# Patient Record
Sex: Male | Born: 1985 | Race: Black or African American | Hispanic: No | Marital: Single | Smoking: Never smoker
Health system: Southern US, Community
[De-identification: ages and names within clinical notes are randomized; demographics above are authoritative.]

## PROBLEM LIST (undated history)

## (undated) DIAGNOSIS — M869 Osteomyelitis, unspecified: Secondary | ICD-10-CM

## (undated) HISTORY — PX: NO PAST SURGERIES: SHX2092

## (undated) HISTORY — DX: Osteomyelitis, unspecified: M86.9

---

## 2010-05-16 ENCOUNTER — Inpatient Hospital Stay (HOSPITAL_COMMUNITY)
Admission: EM | Admit: 2010-05-16 | Discharge: 2010-05-20 | DRG: 295 | Disposition: A | Discharge status: Home / Self Care | Payer: BC Managed Care – PPO | Attending: Internal Medicine | Admitting: Internal Medicine

## 2010-05-16 ENCOUNTER — Inpatient Hospital Stay (INDEPENDENT_AMBULATORY_CARE_PROVIDER_SITE_OTHER)
Admission: RE | Admit: 2010-05-16 | Discharge: 2010-05-16 | Disposition: A | Discharge status: Home / Self Care | Payer: BC Managed Care – PPO | Source: Ambulatory Visit | Attending: Emergency Medicine | Admitting: Emergency Medicine

## 2010-05-16 DIAGNOSIS — Z23 Encounter for immunization: Secondary | ICD-10-CM

## 2010-05-16 DIAGNOSIS — IMO0001 Reserved for inherently not codable concepts without codable children: Principal | ICD-10-CM | POA: Diagnosis present

## 2010-05-16 DIAGNOSIS — R7989 Other specified abnormal findings of blood chemistry: Secondary | ICD-10-CM

## 2010-05-16 DIAGNOSIS — I10 Essential (primary) hypertension: Secondary | ICD-10-CM | POA: Diagnosis present

## 2010-05-16 DIAGNOSIS — E119 Type 2 diabetes mellitus without complications: Secondary | ICD-10-CM

## 2010-05-16 DIAGNOSIS — E860 Dehydration: Secondary | ICD-10-CM | POA: Diagnosis present

## 2010-05-16 LAB — DIFFERENTIAL
Basophils Absolute: 0 10*3/uL (ref 0.0–0.1)
Basophils Relative: 1 % (ref 0–1)
Eosinophils Absolute: 0 10*3/uL (ref 0.0–0.7)
Eosinophils Relative: 0 % (ref 0–5)
Lymphocytes Relative: 24 % (ref 12–46)
Lymphs Abs: 2 10*3/uL (ref 0.7–4.0)
Monocytes Absolute: 0.4 10*3/uL (ref 0.1–1.0)
Monocytes Relative: 5 % (ref 3–12)
Neutro Abs: 5.8 10*3/uL (ref 1.7–7.7)
Neutrophils Relative %: 70 % (ref 43–77)

## 2010-05-16 LAB — KETONES, QUALITATIVE: Acetone, Bld: NEGATIVE

## 2010-05-16 LAB — COMPREHENSIVE METABOLIC PANEL
ALT: 80 U/L — ABNORMAL HIGH (ref 0–53)
AST: 49 U/L — ABNORMAL HIGH (ref 0–37)
Albumin: 4.4 g/dL (ref 3.5–5.2)
Alkaline Phosphatase: 120 U/L — ABNORMAL HIGH (ref 39–117)
BUN: 12 mg/dL (ref 6–23)
CO2: 30 mEq/L (ref 19–32)
Calcium: 9.7 mg/dL (ref 8.4–10.5)
Chloride: 86 mEq/L — ABNORMAL LOW (ref 96–112)
Creatinine, Ser: 1.4 mg/dL (ref 0.4–1.5)
GFR calc Af Amer: >60 mL/min (ref >60)
GFR calc non Af Amer: >60 mL/min (ref >60)
Glucose, Bld: 667 mg/dL (ref 70–99)
Potassium: 4.8 mEq/L (ref 3.5–5.1)
Sodium: 132 mEq/L — ABNORMAL LOW (ref 135–145)
Total Bilirubin: 0.8 mg/dL (ref 0.3–1.2)
Total Protein: 8.6 g/dL — ABNORMAL HIGH (ref 6.0–8.3)

## 2010-05-16 LAB — CBC
HCT: 45.5 % (ref 39.0–52.0)
Hemoglobin: 16 g/dL (ref 13.0–17.0)
MCH: 28.8 pg (ref 26.0–34.0)
MCHC: 35.2 g/dL (ref 30.0–36.0)
MCV: 82 fL (ref 78.0–100.0)
Platelets: 267 10*3/uL (ref 150–400)
RBC: 5.55 MIL/uL (ref 4.22–5.81)
RDW: 11.6 % (ref 11.5–15.5)
WBC: 8.4 10*3/uL (ref 4.0–10.5)

## 2010-05-16 LAB — GLUCOSE, CAPILLARY
Glucose-Capillary: >600 mg/dL (ref 70–99)
Glucose-Capillary: >600 mg/dL (ref 70–99)
Glucose-Capillary: 434 mg/dL — ABNORMAL HIGH (ref 70–99)
Glucose-Capillary: 444 mg/dL — ABNORMAL HIGH (ref 70–99)

## 2010-05-16 LAB — URINALYSIS, ROUTINE W REFLEX MICROSCOPIC
Bilirubin Urine: NEGATIVE
Ketones, ur: 15 mg/dL — AB
Leukocytes, UA: NEGATIVE
Nitrite: NEGATIVE
Protein, ur: NEGATIVE mg/dL
Specific Gravity, Urine: 1.041 — ABNORMAL HIGH (ref 1.005–1.030)
Urine Glucose, Fasting: >1000 mg/dL — AB
Urobilinogen, UA: 0.2 mg/dL (ref 0.0–1.0)
pH: 5.5 (ref 5.0–8.0)

## 2010-05-16 LAB — URINE MICROSCOPIC-ADD ON

## 2010-05-16 LAB — POCT URINALYSIS DIPSTICK
Bilirubin Urine: NEGATIVE
Ketones, ur: NEGATIVE mg/dL
Nitrite: NEGATIVE
Protein, ur: NEGATIVE mg/dL
Specific Gravity, Urine: <=1.005 (ref 1.005–1.030)
Urine Glucose, Fasting: 500 mg/dL — AB
Urobilinogen, UA: 0.2 mg/dL (ref 0.0–1.0)
pH: 5.5 (ref 5.0–8.0)

## 2010-05-16 LAB — POCT I-STAT, CHEM 8
BUN: 16 mg/dL (ref 6–23)
Calcium, Ion: 1.09 mmol/L — ABNORMAL LOW (ref 1.12–1.32)
Chloride: 88 mEq/L — ABNORMAL LOW (ref 96–112)
Creatinine, Ser: 1.3 mg/dL (ref 0.4–1.5)
Glucose, Bld: >700 mg/dL (ref 70–99)
HCT: 52 % (ref 39.0–52.0)
Hemoglobin: 17.7 g/dL — ABNORMAL HIGH (ref 13.0–17.0)
Potassium: 4.9 mEq/L (ref 3.5–5.1)
Sodium: 127 mEq/L — ABNORMAL LOW (ref 135–145)
TCO2: 30 mmol/L (ref 0–100)

## 2010-05-17 LAB — GLUCOSE, CAPILLARY
Glucose-Capillary: 379 mg/dL — ABNORMAL HIGH (ref 70–99)
Glucose-Capillary: 416 mg/dL — ABNORMAL HIGH (ref 70–99)
Glucose-Capillary: 343 mg/dL — ABNORMAL HIGH (ref 70–99)
Glucose-Capillary: 385 mg/dL — ABNORMAL HIGH (ref 70–99)

## 2010-05-17 LAB — HEMOGLOBIN A1C
Hgb A1c MFr Bld: 11.5 % — ABNORMAL HIGH (ref <5.7)
Mean Plasma Glucose: 283 mg/dL — ABNORMAL HIGH (ref <117)

## 2010-05-18 LAB — GLUCOSE, CAPILLARY
Glucose-Capillary: 341 mg/dL — ABNORMAL HIGH (ref 70–99)
Glucose-Capillary: 344 mg/dL — ABNORMAL HIGH (ref 70–99)
Glucose-Capillary: 352 mg/dL — ABNORMAL HIGH (ref 70–99)
Glucose-Capillary: 381 mg/dL — ABNORMAL HIGH (ref 70–99)

## 2010-05-18 LAB — BASIC METABOLIC PANEL
BUN: 8 mg/dL (ref 6–23)
CO2: 26 mEq/L (ref 19–32)
Calcium: 8.5 mg/dL (ref 8.4–10.5)
Chloride: 100 mEq/L (ref 96–112)
Creatinine, Ser: 0.87 mg/dL (ref 0.4–1.5)
GFR calc Af Amer: >60 mL/min (ref >60)
GFR calc non Af Amer: >60 mL/min (ref >60)
Glucose, Bld: 341 mg/dL — ABNORMAL HIGH (ref 70–99)
Potassium: 4 mEq/L (ref 3.5–5.1)
Sodium: 132 mEq/L — ABNORMAL LOW (ref 135–145)

## 2010-05-18 LAB — MICROALBUMIN, URINE: Microalb, Ur: 0.5 mg/dL (ref 0.00–1.89)

## 2010-05-19 LAB — BASIC METABOLIC PANEL
BUN: 5 mg/dL — ABNORMAL LOW (ref 6–23)
CO2: 26 mEq/L (ref 19–32)
Calcium: 8.6 mg/dL (ref 8.4–10.5)
Chloride: 100 mEq/L (ref 96–112)
Creatinine, Ser: 0.93 mg/dL (ref 0.4–1.5)
GFR calc Af Amer: >60 mL/min (ref >60)
GFR calc non Af Amer: >60 mL/min (ref >60)
Glucose, Bld: 305 mg/dL — ABNORMAL HIGH (ref 70–99)
Potassium: 3.6 mEq/L (ref 3.5–5.1)
Sodium: 131 mEq/L — ABNORMAL LOW (ref 135–145)

## 2010-05-19 LAB — GLUCOSE, CAPILLARY
Glucose-Capillary: 316 mg/dL — ABNORMAL HIGH (ref 70–99)
Glucose-Capillary: 364 mg/dL — ABNORMAL HIGH (ref 70–99)
Glucose-Capillary: 280 mg/dL — ABNORMAL HIGH (ref 70–99)
Glucose-Capillary: 317 mg/dL — ABNORMAL HIGH (ref 70–99)

## 2010-05-20 LAB — CBC
HCT: 33.4 % — ABNORMAL LOW (ref 39.0–52.0)
Hemoglobin: 11.1 g/dL — ABNORMAL LOW (ref 13.0–17.0)
MCH: 27.3 pg (ref 26.0–34.0)
MCHC: 33.2 g/dL (ref 30.0–36.0)
MCV: 82.3 fL (ref 78.0–100.0)
Platelets: 160 10*3/uL (ref 150–400)
RBC: 4.06 MIL/uL — ABNORMAL LOW (ref 4.22–5.81)
RDW: 12.1 % (ref 11.5–15.5)
WBC: 11 10*3/uL — ABNORMAL HIGH (ref 4.0–10.5)

## 2010-05-20 LAB — BASIC METABOLIC PANEL
BUN: 4 mg/dL — ABNORMAL LOW (ref 6–23)
CO2: 26 mEq/L (ref 19–32)
Calcium: 8.6 mg/dL (ref 8.4–10.5)
Chloride: 102 mEq/L (ref 96–112)
Creatinine, Ser: 0.82 mg/dL (ref 0.4–1.5)
GFR calc Af Amer: >60 mL/min (ref >60)
GFR calc non Af Amer: >60 mL/min (ref >60)
Glucose, Bld: 274 mg/dL — ABNORMAL HIGH (ref 70–99)
Potassium: 3.5 mEq/L (ref 3.5–5.1)
Sodium: 135 mEq/L (ref 135–145)

## 2010-05-20 LAB — GLUCOSE, CAPILLARY
Glucose-Capillary: 268 mg/dL — ABNORMAL HIGH (ref 70–99)
Glucose-Capillary: 318 mg/dL — ABNORMAL HIGH (ref 70–99)
Glucose-Capillary: 285 mg/dL — ABNORMAL HIGH (ref 70–99)

## 2010-05-21 NOTE — Discharge Summary (Signed)
NAME:  [name], [name]              ACCOUNT NO.:  [id number]  MEDICAL RECORD NO.:  [id number]           PATIENT TYPE:  I  LOCATION:  5005                         FACILITY:  [facility]  PHYSICIAN:  Peggye Pitt, M.D. DATE OF BIRTH:  [birth year]  DATE OF ADMISSION:  05/16/2010 DATE OF DISCHARGE:  05/20/2010                              DISCHARGE SUMMARY   NEW PRIMARY CARE PHYSICIAN:  Dr. Cain Saupe.  ENDOCRINOLOGIST:  Tonita Cong, MD.  DISCHARGE DIAGNOSES: 1. New-onset diabetes mellitus, likely type 2. 2. Elevated blood pressure.  DISCHARGE MEDICATIONS: 1. Include metformin 1000 mg twice daily. 2. Lantus insulin 30 units injected subcutaneously at bedtime. 3. Lisinopril 5 mg daily.  CONSULTATION THIS HOSPITALIZATION:  None.  IMAGES AND PROCEDURES:  None.  DISPOSITION AND FOLLOWUP:  Mr. Marcott will be discharged home today in stable and improved condition.  Our Case Management Department is setting him up with a new primary care physician, Dr. Jillyn Hidden and new endocrinologist, Dr. Sharl Ma and these appointments will be relayed to him prior to his discharge.  HISTORY AND PHYSICAL:  For details please refer to dictation on May 16, 2010, by Dr. Butler Denmark, but in brief, [name] is a 25 year old morbidly obese African-American gentleman with no significant past medical history, who presented to the hospital with generalized weakness, increased urination, lightheadedness, and 2 weeks of dry mouth.  Upon his arrival to Emergency Department, he was found to have a CBG of 667 and we were asked to admit him for further evaluation.  HOSPITAL COURSE BY PROBLEM: 1. New-onset diabetes mellitus.  This is likely type 2.  He was     started on Lantus insulin.  This has been titrated up to 30.  His     CBG's on day of discharge are still somewhat elevated in the 200s.     The Lantus can certainly be titrated an the outpatient basis.  He     has also been started on metformin 1000 mg twice  daily to help with     insulin resistance.  His hemoglobin A1c was 11.5.  He has been     given diabetic teaching as well as teaching on insulin     administration.  We will be set up with appointments with new PCP     and endocrinologist and we have deemed him stable for discharge     home today. 2. Elevated blood pressure.  He has never been formally diagnosed with     hypertension, however, throughout this hospitalization, his blood     pressure has been consistently noted to be above the 130/80 goal     for diabetics, normally staying around the 148/90 range.  Because     of his history of new onset diabetes, I will start him on 5 mg of     lisinopril, which will protect his kidneys and also help lower     blood pressure to goal.  This will need to be closely monitored as     an outpatient and upon followup with PCP, he will need a BMET to     make sure that  his electrolytes and renal function are within normal limits. 3. Vitals on day of discharge, blood pressure 130/58, heart rate 77,     respirations 20, sats 97% on room air and temperature 98.2.     Peggye Pitt, M.D.     EH/MEDQ  D:  05/20/2010  T:  05/20/2010  Job:  [id number]  cc:   Cain Saupe Tonita Cong, M.D.  Electronically Signed by Peggye Pitt M.D. on 05/21/2010 05:20:50 PM
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
## 2011-11-01 ENCOUNTER — Encounter (HOSPITAL_COMMUNITY): Payer: Self-pay | Admitting: Emergency Medicine

## 2011-11-01 ENCOUNTER — Emergency Department (HOSPITAL_COMMUNITY)
Admission: EM | Admit: 2011-11-01 | Discharge: 2011-11-01 | Disposition: A | Discharge status: Nursing Home (Includes ICF) | Payer: BC Managed Care – PPO | Source: Home / Self Care | Attending: Emergency Medicine | Admitting: Emergency Medicine

## 2011-11-01 ENCOUNTER — Emergency Department (HOSPITAL_COMMUNITY)
Admission: EM | Admit: 2011-11-01 | Discharge: 2011-11-01 | Disposition: A | Discharge status: Home / Self Care | Payer: BC Managed Care – PPO | Attending: Emergency Medicine | Admitting: Emergency Medicine

## 2011-11-01 DIAGNOSIS — E1169 Type 2 diabetes mellitus with other specified complication: Secondary | ICD-10-CM | POA: Insufficient documentation

## 2011-11-01 DIAGNOSIS — IMO0001 Reserved for inherently not codable concepts without codable children: Secondary | ICD-10-CM

## 2011-11-01 DIAGNOSIS — IMO0002 Reserved for concepts with insufficient information to code with codable children: Secondary | ICD-10-CM

## 2011-11-01 DIAGNOSIS — E1165 Type 2 diabetes mellitus with hyperglycemia: Secondary | ICD-10-CM

## 2011-11-01 DIAGNOSIS — R7309 Other abnormal glucose: Secondary | ICD-10-CM

## 2011-11-01 DIAGNOSIS — Z794 Long term (current) use of insulin: Secondary | ICD-10-CM | POA: Insufficient documentation

## 2011-11-01 DIAGNOSIS — I10 Essential (primary) hypertension: Secondary | ICD-10-CM | POA: Insufficient documentation

## 2011-11-01 DIAGNOSIS — R739 Hyperglycemia, unspecified: Secondary | ICD-10-CM

## 2011-11-01 LAB — GLUCOSE, CAPILLARY
Glucose-Capillary: 551 mg/dL (ref 70–99)
Glucose-Capillary: 432 mg/dL — ABNORMAL HIGH (ref 70–99)
Glucose-Capillary: 377 mg/dL — ABNORMAL HIGH (ref 70–99)
Glucose-Capillary: 379 mg/dL — ABNORMAL HIGH (ref 70–99)
Glucose-Capillary: 375 mg/dL — ABNORMAL HIGH (ref 70–99)
Glucose-Capillary: 299 mg/dL — ABNORMAL HIGH (ref 70–99)
Glucose-Capillary: 302 mg/dL — ABNORMAL HIGH (ref 70–99)
Glucose-Capillary: 237 mg/dL — ABNORMAL HIGH (ref 70–99)
Glucose-Capillary: 196 mg/dL — ABNORMAL HIGH (ref 70–99)

## 2011-11-01 LAB — POCT I-STAT, CHEM 8
BUN: 13 mg/dL (ref 6–23)
Calcium, Ion: 1.19 mmol/L (ref 1.12–1.23)
Chloride: 94 mEq/L — ABNORMAL LOW (ref 96–112)
Creatinine, Ser: 1.2 mg/dL (ref 0.50–1.35)
Glucose, Bld: >700 mg/dL (ref 70–99)
HCT: 54 % — ABNORMAL HIGH (ref 39.0–52.0)
Hemoglobin: 18.4 g/dL — ABNORMAL HIGH (ref 13.0–17.0)
Potassium: 4.3 mEq/L (ref 3.5–5.1)
Sodium: 132 mEq/L — ABNORMAL LOW (ref 135–145)
TCO2: 23 mmol/L (ref 0–100)

## 2011-11-01 MED ORDER — DEXTROSE-NACL 5-0.45 % IV SOLN
INTRAVENOUS | Status: DC
  Administered 2011-11-01: 22:00:00 via INTRAVENOUS

## 2011-11-01 MED ORDER — DEXTROSE 50 % IV SOLN: 25.0000 mL | INTRAVENOUS | Status: DC | PRN

## 2011-11-01 MED ORDER — INSULIN REGULAR BOLUS VIA INFUSION
0.0000 [IU] | Freq: Three times a day (TID) | INTRAVENOUS | Status: DC
  Filled 2011-11-01: qty 10

## 2011-11-01 MED ORDER — SODIUM CHLORIDE 0.9 % IV SOLN
INTRAVENOUS | Status: DC
  Administered 2011-11-01: 13:00:00 via INTRAVENOUS

## 2011-11-01 MED ORDER — SODIUM CHLORIDE 0.9 % IV BOLUS (SEPSIS)
1000.0000 mL | Freq: Once | INTRAVENOUS | Status: AC
  Administered 2011-11-01: 1000 mL via INTRAVENOUS

## 2011-11-01 MED ORDER — SODIUM CHLORIDE 0.9 % IV SOLN
Freq: Once | INTRAVENOUS | Status: AC
  Administered 2011-11-01: 1000 mL via INTRAVENOUS

## 2011-11-01 MED ORDER — SODIUM CHLORIDE 0.9 % IV SOLN
INTRAVENOUS | Status: DC
  Administered 2011-11-01 (×2): via INTRAVENOUS
  Administered 2011-11-01: 1000 mL via INTRAVENOUS

## 2011-11-01 MED ORDER — METFORMIN HCL 500 MG PO TABS: 500.0000 mg | ORAL_TABLET | Freq: Two times a day (BID) | ORAL | Status: DC

## 2011-11-01 MED ORDER — DEXTROSE-NACL 5-0.45 % IV SOLN: INTRAVENOUS | Status: DC

## 2011-11-01 MED ORDER — SODIUM CHLORIDE 0.9 % IV SOLN
INTRAVENOUS | Status: DC
  Administered 2011-11-01: 6.3 [IU]/h via INTRAVENOUS
  Administered 2011-11-01: 3.7 [IU]/h via INTRAVENOUS
  Filled 2011-11-01: qty 1

## 2011-11-01 MED ORDER — INSULIN ASPART 100 UNIT/ML ~~LOC~~ SOLN
SUBCUTANEOUS | Status: AC
  Filled 2011-11-01: qty 1

## 2011-11-01 MED ORDER — INSULIN ASPART 100 UNIT/ML ~~LOC~~ SOLN
10.0000 [IU] | Freq: Once | SUBCUTANEOUS | Status: AC
  Administered 2011-11-01: 10 [IU] via INTRAVENOUS

## 2011-11-01 NOTE — ED Notes (Signed)
Monitor and alarms intact, o2 applied

## 2011-11-01 NOTE — ED Provider Notes (Signed)
History     CSN: [id number]  Arrival date & time 11/01/11  1308   First MD Initiated Contact with Patient 11/01/11 1311      Chief Complaint  Patient presents with  . Hyperglycemia    (Consider location/radiation/quality/duration/timing/severity/associated sxs/prior treatment) HPI Comments: Patients with history of type 2 diabetes presents with complaint of elevated blood sugar, polydipsia, polyuria. Patient was diagnosed with diabetes approximately one year ago and was started on insulin which was then changed to metformin. Patient states that he lost a significant amount of weight and was able to come off of the metformin. He has been off the metformin for several months. Patient has continued to check his blood sugar and noted that they've been gradually rising over the past month. He was recently on a trip to [place] and returned home this morning. Patient states that during the drive home he had generalized weakness, increased urination and thirst. He went to urgent care for evaluation today and was found to have a blood sugar greater than 700 and an anion gap of 15. Patient was transferred to the emergency department for further management. Patient states that he has had some upper respiratory symptoms and cough over the past few days. Otherwise he denies any medical complaints. Nothing makes the symptoms better or worse. Onset was gradual. Course is constant.  The history is provided by the patient.    Past Medical History  Diagnosis Date  . Diabetes mellitus   . Hypertension     No past surgical history on file.  Family History  Problem Relation Age of Onset  . Hypertension Other   . Diabetes Other     History  Substance Use Topics  . Smoking status: Never Smoker   . Smokeless tobacco: Not on file  . Alcohol Use: No      Review of Systems  Constitutional: Positive for fatigue. Negative for fever.  HENT: Negative for sore throat and rhinorrhea.   Eyes: Negative  for redness.  Respiratory: Negative for cough.   Cardiovascular: Negative for chest pain.  Gastrointestinal: Negative for nausea, vomiting, abdominal pain and diarrhea.  Genitourinary: Positive for frequency. Negative for dysuria and decreased urine volume.  Musculoskeletal: Negative for myalgias.  Skin: Negative for rash.  Neurological: Negative for headaches.    Allergies  Review of patient's allergies indicates no known allergies.  Home Medications  No current outpatient prescriptions on file.  BP 133/93  Pulse 95  Temp 97.6 F (36.4 C) (Oral)  Resp 18  SpO2 98%  Physical Exam  Nursing note and vitals reviewed. Constitutional: He appears well-developed and well-nourished.  HENT:  Head: Normocephalic and atraumatic.  Eyes: Conjunctivae are normal. Pupils are equal, round, and reactive to light. Right eye exhibits no discharge. Left eye exhibits no discharge.  Neck: Normal range of motion. Neck supple.  Cardiovascular: Normal rate, regular rhythm and normal heart sounds.   No murmur heard. Pulmonary/Chest: Effort normal and breath sounds normal. No respiratory distress. He has no wheezes.  Abdominal: Soft. There is no tenderness.  Neurological: He is alert.  Skin: Skin is warm and dry.  Psychiatric: He has a normal mood and affect.    ED Course  Procedures (including critical care time)  Labs Reviewed  GLUCOSE, CAPILLARY - Abnormal; Notable for the following:    Glucose-Capillary 551 (*)     All other components within normal limits  GLUCOSE, CAPILLARY - Abnormal; Notable for the following:    Glucose-Capillary 432 (*)  All other components within normal limits   No results found.   1. Hyperglycemia     1:52 PM Patient seen and examined. Work-up initiated. Medications ordered.   Vital signs reviewed and are as follows: Filed Vitals:   11/01/11 1314  BP: 133/93  Pulse: 95  Temp: 97.6 F (36.4 C)  Resp: 18   [facility]stat performed at [facility] has anion gap  = 15. Will give additional 1L fluid bolus and recheck CBG. If CBG still elevated, will place on glucose stabilizer.   Kir[place] PA-C to care for patient in CDU.   Plan: treat hyperglycemia, re-start metformin, d/c to home when improved.   MDM  Hyperglycemia, off medication. No s/s of DKA, AG=15. Patient appears well.         [place], [place] 11/01/11 [phone]

## 2011-11-01 NOTE — ED Provider Notes (Signed)
Pt placed in CDU on hyperglycemia protocol. Pt off his medications for 1 year because was doing better. In the last 2 months, he has been checking his blood sugar and it has been elevated more than usual, in the last week it has been over 500. Today went to UC because was feeling weak, nauseated, CBG there 700.  Pt received fluids in ED, will start on glucose stabilizer  Pt in NAD, VS normal. Regular HR and rhythm, lungs clear to ausculatation bilaterally. Abdomen soft non tender.   Results for orders placed during the hospital encounter of 11/01/11  GLUCOSE, CAPILLARY      Component Value Range   Glucose-Capillary 551 (*) 70 - 99 mg/dL   Comment 1 Documented in Chart     Comment 2 Notify RN    GLUCOSE, CAPILLARY      Component Value Range   Glucose-Capillary 432 (*) 70 - 99 mg/dL  GLUCOSE, CAPILLARY      Component Value Range   Glucose-Capillary 377 (*) 70 - 99 mg/dL  GLUCOSE, CAPILLARY      Component Value Range   Glucose-Capillary 379 (*) 70 - 99 mg/dL  GLUCOSE, CAPILLARY      Component Value Range   Glucose-Capillary 375 (*) 70 - 99 mg/dL  GLUCOSE, CAPILLARY      Component Value Range   Glucose-Capillary 299 (*) 70 - 99 mg/dL  GLUCOSE, CAPILLARY      Component Value Range   Glucose-Capillary 302 (*) 70 - 99 mg/dL  GLUCOSE, CAPILLARY      Component Value Range   Glucose-Capillary 237 (*) 70 - 99 mg/dL   No results found.  NO anion gap at UC. CBG down to 198, last checked. Pt feeling better. Will d/c home.     Lottie Mussel, [place] 11/01/11 [phone]

## 2011-11-01 NOTE — ED Notes (Signed)
Reports he has been off insulin for a year at least , with endocrinologists knowledge.  Went home to [place].  Yesterday the patient noticed frequent urination when driving back to this area.  Patient also noticed vision blurry today.  Reports his cbg equipment reported cbg as "hi".  Patient has been helping self to water frequently this visit.

## 2011-11-01 NOTE — ED Notes (Signed)
Called [facility] °

## 2011-11-01 NOTE — ED Notes (Signed)
Pt known type 1 diabetic, reportedly weaned off of diabetes medication by FMD. Yesterday began having body aches, urinary frequency, blurred vision. Seen at urgent care today with bbg greater than 700. Given 10units regular insulin, 1LNS hanging, 20g LAC.

## 2011-11-01 NOTE — ED Provider Notes (Signed)
Medical screening examination/treatment/procedure(s) were performed by non-physician practitioner and as supervising physician I was immediately available for consultation/collaboration.   Gwyneth Sprout, MD 11/01/11 1547

## 2011-11-01 NOTE — ED Notes (Signed)
Report given to CDU plan to move pt to CDU

## 2011-11-01 NOTE — ED Notes (Signed)
Carb Mod Diet ordered spoke with Tabitha 

## 2011-11-01 NOTE — ED Provider Notes (Signed)
Chief Complaint  Patient presents with  . Hyperglycemia    History of Present Illness:  The patient is a 26 year old male with one half year history of diabetes. This was initially treated with insulin, then he was switched to metformin, blood in the 8-10 months ago he went off of the metformin. He remained stable for months up until the past 2 days. He went home to [place] to visit family. On the way back he developed polyuria, polydipsia, generalized aches, blurry vision, abdominal pain, nausea, vomiting, shortness of breath, and he felt hot.  Review of Systems:  Other than noted above, the patient denies any of the following symptoms. Systemic:  No fever, chills, fatigue, weight loss or gain. Eye:  No blurred vision or diplopia. Lungs:  No cough, wheezing, or shortness of breath. Heart:  No chest pain, tightness, pressure, palpitation, dizziness, syncope, or edema. Abdomen:  No abdominal pain, nausea, vomiting or diarrrhea. GU:  No dysuria, frequency, urgency, hematuria. Ext:  No pain, paresthesias, swelling, or ulcerations. Endocrine:  No polyuria, polydipsia, heat or cold intolerance. Skin:  No rash or itching. Neuro:  No focal weakness or numbness.   PMFSH:  Past medical history, family history, social history, meds, and allergies were reviewed.  Physical Exam:   Vital signs:  BP 136/84  Pulse 92  Temp 97.5 F (36.4 C) (Oral)  Resp 20  SpO2 98% Gen:  Alert, oriented, in no distress. Eye:  PERRL, full EOM, lids conjunctivas, and sclera unremarkable. ENT:  TMs and canals normal.  Mucous membranes moist.  No acetone odor.  Pharynx clear.   Neck:  Supple, full ROM, no adenopathy or tenderness.  No JVD. Lungs:  Clear to auscultation.  No wheezes, rales or rhonchi. Heart:  Regular rhythm.  No gallops or murmers. Abdomen:  Soft, flat, non-distended, nontener.  No hepato-splenomegaly or mass.  Bowel sounds normal.  No pulsatile midline mass or bruit. Ext:  No edema, pulses full.   No ulceration or skin lesions. Skin:  Clear, warm and dry.  No rash or lesions. Neuro:  Alert and oriented times 3.  No focal weakness.  Speech normal.  CNs intact.  Labs:   Results for orders placed during the hospital encounter of 11/01/11  POCT I-STAT, CHEM 8      Component Value Range   Sodium 132 (*) 135 - 145 mEq/L   Potassium 4.3  3.5 - 5.1 mEq/L   Chloride 94 (*) 96 - 112 mEq/L   BUN 13  6 - 23 mg/dL   Creatinine, Ser 0.86  0.50 - 1.35 mg/dL   Glucose, Bld >578 (*) 70 - 99 mg/dL   Calcium, Ion 4.69  6.29 - 1.23 mmol/L   TCO2 23  0 - 100 mmol/L   Hemoglobin 18.4 (*) 13.0 - 17.0 g/dL   HCT 52.8 (*) 41.3 - 24.4 %   Comment NOTIFIED PHYSICIAN      Medications given in UCC:  An IV was started with normal saline and he was given 10 units of NovoLog insulin intravenously.  Assessment:  The primary encounter diagnosis was Hyperglycemia. A diagnosis of Diabetes type 2, uncontrolled was also pertinent to this visit.  Plan:   1.  The following meds were prescribed:   New Prescriptions   No medications on file   2.  The patient was transported to the emergency department via [facility].   Reuben Likes, MD 11/01/11 [phone]

## 2011-11-04 NOTE — ED Provider Notes (Signed)
Medical screening examination/treatment/procedure(s) were performed by non-physician practitioner and as supervising physician I was immediately available for consultation/collaboration.   Gwyneth Sprout, MD 11/04/11 [phone]

## 2011-11-07 ENCOUNTER — Emergency Department (HOSPITAL_COMMUNITY)
Admission: EM | Admit: 2011-11-07 | Discharge: 2011-11-08 | Disposition: A | Discharge status: Home / Self Care | Payer: BC Managed Care – PPO | Attending: Emergency Medicine | Admitting: Emergency Medicine

## 2011-11-07 ENCOUNTER — Encounter (HOSPITAL_COMMUNITY): Payer: Self-pay | Admitting: Emergency Medicine

## 2011-11-07 DIAGNOSIS — R739 Hyperglycemia, unspecified: Secondary | ICD-10-CM

## 2011-11-07 DIAGNOSIS — E119 Type 2 diabetes mellitus without complications: Secondary | ICD-10-CM | POA: Insufficient documentation

## 2011-11-07 DIAGNOSIS — I10 Essential (primary) hypertension: Secondary | ICD-10-CM | POA: Insufficient documentation

## 2011-11-07 LAB — URINALYSIS, ROUTINE W REFLEX MICROSCOPIC
Glucose, UA: >1000 mg/dL — AB
Hgb urine dipstick: NEGATIVE
Ketones, ur: >80 mg/dL — AB
Leukocytes, UA: NEGATIVE
Nitrite: NEGATIVE
Protein, ur: NEGATIVE mg/dL
Specific Gravity, Urine: 1.034 — ABNORMAL HIGH (ref 1.005–1.030)
Urobilinogen, UA: 0.2 mg/dL (ref 0.0–1.0)
pH: 5 (ref 5.0–8.0)

## 2011-11-07 LAB — CBC WITH DIFFERENTIAL/PLATELET
Basophils Absolute: 0 10*3/uL (ref 0.0–0.1)
Basophils Relative: 1 % (ref 0–1)
Eosinophils Absolute: 0.1 10*3/uL (ref 0.0–0.7)
Eosinophils Relative: 1 % (ref 0–5)
HCT: 44.6 % (ref 39.0–52.0)
Hemoglobin: 15.6 g/dL (ref 13.0–17.0)
Lymphocytes Relative: 28 % (ref 12–46)
Lymphs Abs: 1.8 10*3/uL (ref 0.7–4.0)
MCH: 29.1 pg (ref 26.0–34.0)
MCHC: 35 g/dL (ref 30.0–36.0)
MCV: 83.1 fL (ref 78.0–100.0)
Monocytes Absolute: 0.5 10*3/uL (ref 0.1–1.0)
Monocytes Relative: 7 % (ref 3–12)
Neutro Abs: 4.2 10*3/uL (ref 1.7–7.7)
Neutrophils Relative %: 64 % (ref 43–77)
Platelets: 210 10*3/uL (ref 150–400)
RBC: 5.37 MIL/uL (ref 4.22–5.81)
RDW: 12.8 % (ref 11.5–15.5)
WBC: 6.6 10*3/uL (ref 4.0–10.5)

## 2011-11-07 LAB — COMPREHENSIVE METABOLIC PANEL
ALT: 22 U/L (ref 0–53)
AST: 15 U/L (ref 0–37)
Albumin: 4.3 g/dL (ref 3.5–5.2)
Alkaline Phosphatase: 99 U/L (ref 39–117)
BUN: 13 mg/dL (ref 6–23)
CO2: 22 mEq/L (ref 19–32)
Calcium: 10.6 mg/dL — ABNORMAL HIGH (ref 8.4–10.5)
Chloride: 88 mEq/L — ABNORMAL LOW (ref 96–112)
Creatinine, Ser: 1.1 mg/dL (ref 0.50–1.35)
GFR calc Af Amer: >90 mL/min (ref >90)
GFR calc non Af Amer: >90 mL/min (ref >90)
Glucose, Bld: 559 mg/dL (ref 70–99)
Potassium: 4.7 mEq/L (ref 3.5–5.1)
Sodium: 131 mEq/L — ABNORMAL LOW (ref 135–145)
Total Bilirubin: 0.5 mg/dL (ref 0.3–1.2)
Total Protein: 8.3 g/dL (ref 6.0–8.3)

## 2011-11-07 LAB — GLUCOSE, CAPILLARY: Glucose-Capillary: 515 mg/dL — ABNORMAL HIGH (ref 70–99)

## 2011-11-07 LAB — URINE MICROSCOPIC-ADD ON

## 2011-11-07 NOTE — ED Notes (Signed)
Was here Tuesday for hyperglycemia--was sent home on meds that made him feel sick-reports dizziness and dysuria; was dx with DM about 1.5 years ago- lost weight so has not had to take insulin or metformin for 8 months--on Tuesday restarted metformin, but this time is having side effects from meds; pt a&ox4

## 2011-11-08 LAB — GLUCOSE, CAPILLARY
Glucose-Capillary: 310 mg/dL — ABNORMAL HIGH (ref 70–99)
Glucose-Capillary: 435 mg/dL — ABNORMAL HIGH (ref 70–99)

## 2011-11-08 MED ORDER — INSULIN ASPART 100 UNIT/ML ~~LOC~~ SOLN
10.0000 [IU] | Freq: Once | SUBCUTANEOUS | Status: AC
  Administered 2011-11-08: 10 [IU] via INTRAVENOUS
  Filled 2011-11-08: qty 1

## 2011-11-08 MED ORDER — SODIUM CHLORIDE 0.9 % IV BOLUS (SEPSIS)
1000.0000 mL | Freq: Once | INTRAVENOUS | Status: AC
  Administered 2011-11-08: 1000 mL via INTRAVENOUS

## 2011-11-08 NOTE — ED Notes (Signed)
Pt given discharge and follow up instructions after speaking with provider. Denies further needs at this time. Ambulates to lobby in NAD  

## 2011-11-08 NOTE — ED Provider Notes (Signed)
History     CSN: [id number]  Arrival date & time 11/07/11  2215   First MD Initiated Contact with Patient 11/07/11 2356      Chief Complaint  Patient presents with  . Hyperglycemia    (Consider location/radiation/quality/duration/timing/severity/associated sxs/prior treatment) HPI Comments: Patient was seen here two days ago for elevated blood sugar.  Was given ivf and sent home with follow up with endocrine which he could not make happen today.  He felt somewhat light-headed and weak today.  Sugars were over 500 today.    DM was diagnosed about 1 1/2 years ago and has been treated with metformin and weight loss.  Sugars now running high again.  No fevers or chills.  No other complaints.    The history is provided by the patient.    Past Medical History  Diagnosis Date  . Diabetes mellitus   . Hypertension     History reviewed. No pertinent past surgical history.  Family History  Problem Relation Age of Onset  . Hypertension Other   . Diabetes Other     History  Substance Use Topics  . Smoking status: Never Smoker   . Smokeless tobacco: Not on file  . Alcohol Use: No      Review of Systems  Constitutional: Positive for fatigue. Negative for chills and appetite change.  Cardiovascular: Negative for chest pain and palpitations.  All other systems reviewed and are negative.    Allergies  Review of patient's allergies indicates no known allergies.  Home Medications   Current Outpatient Rx  Name Route Sig Dispense Refill  . METFORMIN HCL 500 MG PO TABS Oral Take 1 tablet (500 mg total) by mouth 2 (two) times daily with a meal. 60 tablet 0    BP 131/85  Pulse 98  Temp 98.8 F (37.1 C) (Oral)  Resp 20  SpO2 99%  Physical Exam  Nursing note and vitals reviewed. Constitutional: He is oriented to person, place, and time. He appears well-developed and well-nourished. No distress.  HENT:  Head: Normocephalic and atraumatic.  Mouth/Throat: Oropharynx is  clear and moist.  Eyes: EOM are normal. Pupils are equal, round, and reactive to light.  Neck: Normal range of motion. Neck supple.  Cardiovascular: Normal rate and regular rhythm.   No murmur heard. Pulmonary/Chest: Effort normal and breath sounds normal. No respiratory distress. He has no wheezes.  Abdominal: Soft. Bowel sounds are normal. He exhibits no distension. There is no tenderness.  Musculoskeletal: Normal range of motion.  Neurological: He is alert and oriented to person, place, and time. No cranial nerve deficit. He exhibits normal muscle tone. Coordination normal.  Skin: Skin is warm and dry. He is not diaphoretic.    ED Course  Procedures (including critical care time)  Labs Reviewed  GLUCOSE, CAPILLARY - Abnormal; Notable for the following:    Glucose-Capillary 515 (*)     All other components within normal limits  COMPREHENSIVE METABOLIC PANEL - Abnormal; Notable for the following:    Sodium 131 (*)     Chloride 88 (*)     Glucose, Bld 559 (*)     Calcium 10.6 (*)     All other components within normal limits  URINALYSIS, ROUTINE W REFLEX MICROSCOPIC - Abnormal; Notable for the following:    Specific Gravity, Urine 1.034 (*)     Glucose, UA >1000 (*)     Bilirubin Urine MODERATE (*)     Ketones, ur >80 (*)     All other  components within normal limits  URINE MICROSCOPIC-ADD ON - Abnormal; Notable for the following:    Casts HYALINE CASTS (*)  GRANULAR CAST   All other components within normal limits  CBC WITH DIFFERENTIAL   No results found.   No diagnosis found.    MDM  The blood sugar is 559 upon presentation but no evidence of dka.  He was given fluids and insulin and it is now in the 300's.  I suspect that he is more of a type 1 diabetic and will likely need insulin.  He is to call to arrange follow up with his endocrinologist in the next few days.          Geoffery Lyons, MD 11/08/11 [phone]

## 2011-12-09 ENCOUNTER — Ambulatory Visit: Payer: Self-pay | Admitting: Physician Assistant

## 2011-12-09 VITALS — BP 128/84 | HR 64 | Temp 97.7°F | Resp 16 | Ht 76.25 in | Wt 319.4 lb

## 2011-12-09 DIAGNOSIS — Z111 Encounter for screening for respiratory tuberculosis: Secondary | ICD-10-CM

## 2011-12-09 DIAGNOSIS — Z0289 Encounter for other administrative examinations: Secondary | ICD-10-CM

## 2011-12-09 NOTE — Progress Notes (Signed)
  Subjective:    Patient ID: [name], male    DOB: Feb 22, 1986, 26 y.o.   MRN: [id number]  HPI 26 year old male presents for physical and form completion and TB test. 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 He has been recently diagnosed with Diabetes and is followed at [facility].  States he is not currently on medication.  No other known medical problems.      Review of Systems  All other systems reviewed and are negative.       Objective:   Physical Exam  Constitutional: He is oriented to person, place, and time. He appears well-developed and well-nourished.  HENT:  Head: Normocephalic and atraumatic.  Right Ear: Hearing, tympanic membrane, external ear and ear canal normal.  Left Ear: Hearing, tympanic membrane, external ear and ear canal normal.  Mouth/Throat: Uvula is midline, oropharynx is clear and moist and mucous membranes are normal. No oropharyngeal exudate.  Eyes: Conjunctivae are normal.  Neck: Normal range of motion. No thyromegaly present.  Cardiovascular: Normal rate, regular rhythm and normal heart sounds.   Pulmonary/Chest: Effort normal and breath sounds normal.  Lymphadenopathy:    He has no cervical adenopathy.  Neurological: He is alert and oriented to person, place, and time.  Psychiatric: He has a normal mood and affect. His behavior is normal. Judgment and thought content normal.          Assessment & Plan:   1. Other general medical examination for administrative purposes    2. Screening for tuberculosis  TB Skin Test  Return in 48-72 hours for TB read Patient forms completed and given to him to bring with him to TB read.

## 2011-12-12 ENCOUNTER — Ambulatory Visit (INDEPENDENT_AMBULATORY_CARE_PROVIDER_SITE_OTHER): Payer: Self-pay | Admitting: Radiology

## 2011-12-12 DIAGNOSIS — Z111 Encounter for screening for respiratory tuberculosis: Secondary | ICD-10-CM

## 2011-12-12 LAB — TB SKIN TEST
Induration: 0 mm
TB Skin Test: NEGATIVE

## 2011-12-12 NOTE — Progress Notes (Signed)
  Subjective:    Patient ID: [name], male    DOB: [birth year], 26 y.o.   MRN: [id number]  HPI  PPD Placement note  [name], 26 y.o. 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 Has the patient ever had a TB Skin Test?: yes  2. Has the patient had a TB Skin Test in the past 6 weeks? no  If so, Date of Test N/A  3. Has the patient ever had a positive reaction? no  If yes, were you treated with INH? no   4. Has the patient ever had a BCG vaccine? no  5. Has the patient ever had Tuberculosis? no  6. Has the patient been in recent contact with anyone known or suspected of having     active TB disease?: no      Date of exposure (if applicable): N/A      Name of person they were exposed to (if applicable): N/A  Patient's Country of origin?: N/A  7. Has the patient ever been advised by a healthcare provider not to have a TB skin test? no   Verified in allergy area and with patient that the patient is not allergic to the products PPD is made of (Phenol or Tween). Yes  Is patient taking any oral or IV steroid medication now or have they taken it in the last month? no  PPD placed on 12/09/11 came in today for reading.  Patient advised to return for reading within 48-72 hours. Negative/ 0mm induration.    Review of Systems     Objective:   Physical Exam 0mm induration negative      Assessment & Plan:  Paperwork provided for patient documenting 0 mm induration of tb skin test.

## 2013-01-19 ENCOUNTER — Inpatient Hospital Stay (HOSPITAL_COMMUNITY)
Admission: EM | Admit: 2013-01-19 | Discharge: 2013-01-22 | DRG: 639 | Disposition: A | Discharge status: Home / Self Care | Payer: Medicaid - Out of State | Attending: Internal Medicine | Admitting: Internal Medicine

## 2013-01-19 ENCOUNTER — Encounter (HOSPITAL_COMMUNITY): Payer: Self-pay | Admitting: Emergency Medicine

## 2013-01-19 DIAGNOSIS — R7309 Other abnormal glucose: Secondary | ICD-10-CM | POA: Diagnosis present

## 2013-01-19 DIAGNOSIS — R3589 Other polyuria: Secondary | ICD-10-CM | POA: Diagnosis present

## 2013-01-19 DIAGNOSIS — E139 Other specified diabetes mellitus without complications: Secondary | ICD-10-CM

## 2013-01-19 DIAGNOSIS — R358 Other polyuria: Secondary | ICD-10-CM | POA: Diagnosis present

## 2013-01-19 DIAGNOSIS — I10 Essential (primary) hypertension: Secondary | ICD-10-CM | POA: Diagnosis present

## 2013-01-19 DIAGNOSIS — R632 Polyphagia: Secondary | ICD-10-CM | POA: Diagnosis present

## 2013-01-19 DIAGNOSIS — R739 Hyperglycemia, unspecified: Secondary | ICD-10-CM | POA: Diagnosis present

## 2013-01-19 DIAGNOSIS — Z833 Family history of diabetes mellitus: Secondary | ICD-10-CM

## 2013-01-19 DIAGNOSIS — E119 Type 2 diabetes mellitus without complications: Secondary | ICD-10-CM

## 2013-01-19 DIAGNOSIS — IMO0001 Reserved for inherently not codable concepts without codable children: Principal | ICD-10-CM | POA: Diagnosis present

## 2013-01-19 LAB — BASIC METABOLIC PANEL
BUN: 11 mg/dL (ref 6–23)
CO2: 27 mEq/L (ref 19–32)
Calcium: 10.1 mg/dL (ref 8.4–10.5)
Chloride: 82 mEq/L — ABNORMAL LOW (ref 96–112)
Creatinine, Ser: 0.8 mg/dL (ref 0.50–1.35)
GFR calc Af Amer: >90 mL/min (ref >90)
GFR calc non Af Amer: >90 mL/min (ref >90)
Glucose, Bld: 915 mg/dL (ref 70–99)
Potassium: 4.8 mEq/L (ref 3.5–5.1)
Sodium: 123 mEq/L — ABNORMAL LOW (ref 135–145)

## 2013-01-19 LAB — CBC WITH DIFFERENTIAL/PLATELET
Basophils Absolute: 0 10*3/uL (ref 0.0–0.1)
Basophils Relative: 1 % (ref 0–1)
Eosinophils Absolute: 0.1 10*3/uL (ref 0.0–0.7)
Eosinophils Relative: 1 % (ref 0–5)
HCT: 46.2 % (ref 39.0–52.0)
Hemoglobin: 16.2 g/dL (ref 13.0–17.0)
Lymphocytes Relative: 31 % (ref 12–46)
Lymphs Abs: 1.4 10*3/uL (ref 0.7–4.0)
MCH: 28.3 pg (ref 26.0–34.0)
MCHC: 35.1 g/dL (ref 30.0–36.0)
MCV: 80.8 fL (ref 78.0–100.0)
Monocytes Absolute: 0.4 10*3/uL (ref 0.1–1.0)
Monocytes Relative: 8 % (ref 3–12)
Neutro Abs: 2.6 10*3/uL (ref 1.7–7.7)
Neutrophils Relative %: 59 % (ref 43–77)
Platelets: 205 10*3/uL (ref 150–400)
RBC: 5.72 MIL/uL (ref 4.22–5.81)
RDW: 12.4 % (ref 11.5–15.5)
WBC: 4.4 10*3/uL (ref 4.0–10.5)

## 2013-01-19 LAB — POCT I-STAT, CHEM 8
BUN: 11 mg/dL (ref 6–23)
Calcium, Ion: 1.26 mmol/L — ABNORMAL HIGH (ref 1.12–1.23)
Chloride: 88 mEq/L — ABNORMAL LOW (ref 96–112)
Creatinine, Ser: 1.1 mg/dL (ref 0.50–1.35)
Glucose, Bld: >700 mg/dL (ref 70–99)
HCT: 52 % (ref 39.0–52.0)
Hemoglobin: 17.7 g/dL — ABNORMAL HIGH (ref 13.0–17.0)
Potassium: 4.8 mEq/L (ref 3.5–5.1)
Sodium: 126 mEq/L — ABNORMAL LOW (ref 135–145)
TCO2: 29 mmol/L (ref 0–100)

## 2013-01-19 LAB — GLUCOSE, CAPILLARY
Glucose-Capillary: >600 mg/dL (ref 70–99)
Glucose-Capillary: 474 mg/dL — ABNORMAL HIGH (ref 70–99)

## 2013-01-19 MED ORDER — SODIUM CHLORIDE 0.9 % IV SOLN
1000.0000 mL | INTRAVENOUS | Status: DC
  Administered 2013-01-19: 1000 mL via INTRAVENOUS

## 2013-01-19 MED ORDER — SODIUM CHLORIDE 0.9 % IV SOLN
INTRAVENOUS | Status: DC
  Administered 2013-01-20 – 2013-01-22 (×2): via INTRAVENOUS

## 2013-01-19 MED ORDER — POTASSIUM CHLORIDE CRYS ER 20 MEQ PO TBCR
20.0000 meq | EXTENDED_RELEASE_TABLET | Freq: Once | ORAL | Status: AC
  Administered 2013-01-19: 20 meq via ORAL
  Filled 2013-01-19: qty 1

## 2013-01-19 MED ORDER — SODIUM CHLORIDE 0.9 % IV SOLN
INTRAVENOUS | Status: DC
  Administered 2013-01-19: 5.4 [IU]/h via INTRAVENOUS
  Administered 2013-01-20: 12:00:00 via INTRAVENOUS
  Administered 2013-01-20: 6.7 [IU]/h via INTRAVENOUS
  Administered 2013-01-20: 6.1 [IU]/h via INTRAVENOUS
  Administered 2013-01-20: 7.4 [IU]/h via INTRAVENOUS
  Administered 2013-01-20: 4.6 [IU]/h via INTRAVENOUS
  Administered 2013-01-20: 4.2 [IU]/h via INTRAVENOUS
  Administered 2013-01-20: 6.1 [IU]/h via INTRAVENOUS
  Filled 2013-01-19 (×3): qty 1

## 2013-01-19 MED ORDER — SODIUM CHLORIDE 0.9 % IV SOLN
1000.0000 mL | Freq: Once | INTRAVENOUS | Status: AC
  Administered 2013-01-19: 1000 mL via INTRAVENOUS

## 2013-01-19 MED ORDER — DEXTROSE 50 % IV SOLN: 25.0000 mL | INTRAVENOUS | Status: DC | PRN

## 2013-01-19 MED ORDER — DEXTROSE-NACL 5-0.45 % IV SOLN
INTRAVENOUS | Status: DC
  Administered 2013-01-20: 10:00:00 via INTRAVENOUS
  Administered 2013-01-20: 75 mL/h via INTRAVENOUS

## 2013-01-19 MED ORDER — INSULIN REGULAR BOLUS VIA INFUSION
0.0000 [IU] | Freq: Three times a day (TID) | INTRAVENOUS | Status: DC
  Administered 2013-01-20: 7.8 [IU] via INTRAVENOUS
  Administered 2013-01-20: 4.3 [IU] via INTRAVENOUS
  Filled 2013-01-19: qty 10

## 2013-01-19 NOTE — ED Notes (Signed)
Dr. Lynelle Doctor at bedside to see patient.

## 2013-01-19 NOTE — ED Notes (Signed)
Dr. Beacher May at pts bedside and aware of pts glucose of 915.

## 2013-01-19 NOTE — ED Notes (Addendum)
Pt states that he is from [place] and had a PCP that wrote for his medications. Pt states that he changed insurances and the insurance company wants him to see a MD prior to filling his insulin. Pt states that he has been off insulin for 2 weeks. Pt states today he checked his CBG and his glucose meter ran high. Pt only uses insulin occasionally and is usually tightly controlled by diet.

## 2013-01-19 NOTE — ED Provider Notes (Signed)
CSN: [id number]     Arrival date & time 01/19/13  2028 History   First MD Initiated Contact with Patient 01/19/13 2106     Chief Complaint  Patient presents with  . Hyperglycemia    Patient is a 27 y.o. male presenting with hyperglycemia. The history is provided by the patient.  Hyperglycemia Severity:  Severe Onset quality:  Gradual Timing:  Constant Progression:  Worsening Diabetes status:  Controlled with insulin Current diabetic therapy:  Invokana Context comment:  Pt is supposed to be on invokana and insulin but he is having trouble with his insurance.  No medications for the last two weeks. Relieved by:  Nothing Associated symptoms: dehydration, increased thirst and polyuria   Associated symptoms: no abdominal pain, no altered mental status, no blurred vision, no chest pain, no confusion, no dizziness, no dysuria, no fatigue, no fever, no nausea, no shortness of breath, no syncope, no vomiting and no weakness     Past Medical History  Diagnosis Date  . Diabetes mellitus   . Hypertension    History reviewed. No pertinent past surgical history. Family History  Problem Relation Age of Onset  . Hypertension Other   . Diabetes Other    History  Substance Use Topics  . Smoking status: Never Smoker   . Smokeless tobacco: Not on file  . Alcohol Use: No    Review of Systems  Constitutional: Negative for fever and fatigue.  Eyes: Negative for blurred vision.  Respiratory: Negative for shortness of breath.   Cardiovascular: Negative for chest pain and syncope.  Gastrointestinal: Negative for nausea, vomiting and abdominal pain.  Endocrine: Positive for polydipsia and polyuria.  Genitourinary: Negative for dysuria.  Neurological: Negative for dizziness.  Psychiatric/Behavioral: Negative for confusion.  All other systems reviewed and are negative.    Allergies  Review of patient's allergies indicates no known allergies.  Home Medications   Current Outpatient Rx   Name  Route  Sig  Dispense  Refill  . metFORMIN (GLUCOPHAGE) 500 MG tablet   Oral   Take 1 tablet (500 mg total) by mouth 2 (two) times daily with a meal.   60 tablet   0    BP 129/84  Pulse 100  Temp(Src) 97.9 F (36.6 C) (Oral)  Resp 16  Ht 6\' 5"  (1.956 m)  Wt 301 lb 6 oz (136.703 kg)  BMI 35.73 kg/m2  SpO2 95% Physical Exam  Nursing note and vitals reviewed. Constitutional: He appears well-developed and well-nourished. No distress.  Overweight  HENT:  Head: Normocephalic and atraumatic.  Right Ear: External ear normal.  Left Ear: External ear normal.  Eyes: Conjunctivae are normal. Right eye exhibits no discharge. Left eye exhibits no discharge. No scleral icterus.  Neck: Neck supple. No tracheal deviation present.  Cardiovascular: Normal rate, regular rhythm and intact distal pulses.   Pulmonary/Chest: Effort normal and breath sounds normal. No stridor. No respiratory distress. He has no wheezes. He has no rales.  Abdominal: Soft. Bowel sounds are normal. He exhibits no distension. There is no tenderness. There is no rebound and no guarding.  Musculoskeletal: He exhibits no edema and no tenderness.  Neurological: He is alert. He has normal strength. No sensory deficit. Cranial nerve deficit:  no gross defecits noted. He exhibits normal muscle tone. He displays no seizure activity. Coordination normal.  Skin: Skin is warm and dry. No rash noted.  Psychiatric: He has a normal mood and affect.    ED Course  Procedures (including critical care time)  Labs Review Labs Reviewed  GLUCOSE, CAPILLARY - Abnormal; Notable for the following:    Glucose-Capillary >600 (*)    All other components within normal limits  BASIC METABOLIC PANEL - Abnormal; Notable for the following:    Sodium 123 (*)    Chloride 82 (*)    Glucose, Bld 915 (*)    All other components within normal limits  POCT I-STAT, CHEM 8 - Abnormal; Notable for the following:    Sodium 126 (*)    Chloride 88  (*)    Glucose, Bld >700 (*)    Calcium, Ion 1.26 (*)    Hemoglobin 17.7 (*)    All other components within normal limits  CBC WITH DIFFERENTIAL  BASIC METABOLIC PANEL  BASIC METABOLIC PANEL  BASIC METABOLIC PANEL   Imaging Review No results found.  EKG Interpretation   None      Medications  insulin regular (NOVOLIN R,HUMULIN R) 1 Units/mL in sodium chloride 0.9 % 100 mL infusion (5.4 Units/hr Intravenous New Bag/Given 01/19/13 2206)  0.9 %  sodium chloride infusion (1,000 mLs Intravenous New Bag/Given 01/19/13 2141)    Followed by  0.9 %  sodium chloride infusion (not administered)    Followed by  0.9 %  sodium chloride infusion (not administered)     MDM   1. Hyperglycemia    Patient presents with her significant hyperglycemia. He has a borderline anion gap of 14 however his bicarbonate level is normal. The patient is not having any other systemic symptoms to suggest DKA. Patient has been started on IV fluids. An IV insulin infusion has been started. He'll be admitted to the hospital for further treatment    Celene Kras, MD 01/19/13 2222

## 2013-01-19 NOTE — H&P (Signed)
[facility] History and Physical  Max Mccoy OZH:[id number] DOB: Aug 14, 1985 DOA: 01/19/2013  Referring physician: ED PCP: Max Coin, MD  Chief Complaint: Polydipsia, polyuria, polyphagia  HPI: [name] is a 27 y.o. male with h/o DM, had previously been on metformin but had stopped this some time back, was supposed to be started on insulin glargine but apparently his insurance company did not want to cover this medication, as a result he has been on no medication for his DM.  Unfortunately over the past couple of days he has developed worsening polydipsia, polyuria, and polyphagia, and his home BGL meter has read "high" he presented to the ED as a result.  Review of Systems: 12 systems reviewed and otherwise negative.  Past Medical History  Diagnosis Date  . Diabetes mellitus   . Hypertension    History reviewed. No pertinent past surgical history. Social History:  reports that he has never smoked. He does not have any smokeless tobacco history on file. He reports that he does not drink alcohol or use illicit drugs.  No Known Allergies  Family History  Problem Relation Age of Onset  . Hypertension Other   . Diabetes Other     Prior to Admission medications   Medication Sig Start Date End Date Taking? Authorizing Provider  Canagliflozin (INVOKANA) 300 MG TABS Take 300 mg by mouth daily.   Yes Historical Provider, MD  insulin glargine (LANTUS) 100 UNIT/ML injection Inject 25-35 Units into the skin every morning.   Yes Historical Provider, MD   Physical Exam: Filed Vitals:   01/19/13 2300  BP: 123/73  Pulse: 77  Temp:   Resp:     General:  NAD, resting comfortably in bed  Eyes: PEERLA EOMI  ENT: mucous membranes moist  Neck: supple w/o JVD  Cardiovascular: RRR w/o MRG  Respiratory: CTA B  Abdomen: soft, nt, nd, bs+  Skin: no rash nor lesion  Musculoskeletal: MAE, full ROM all 4 extremities  Psychiatric: normal tone and affect  Neurologic:  AAOx3, grossly non-focal  Labs on Admission:  Basic Metabolic Panel:  Recent Labs Lab 01/19/13 2035 01/19/13 2050  NA 123* 126*  K 4.8 4.8  CL 82* 88*  CO2 27  --   GLUCOSE 915* >700*  BUN 11 11  CREATININE 0.80 1.10  CALCIUM 10.1  --    Liver Function Tests: No results found for this basename: AST, ALT, ALKPHOS, BILITOT, PROT, ALBUMIN,  in the last 168 hours No results found for this basename: LIPASE, AMYLASE,  in the last 168 hours No results found for this basename: AMMONIA,  in the last 168 hours CBC:  Recent Labs Lab 01/19/13 2035 01/19/13 2050  WBC 4.4  --   NEUTROABS 2.6  --   HGB 16.2 17.7*  HCT 46.2 52.0  MCV 80.8  --   PLT 205  --    Cardiac Enzymes: No results found for this basename: CKTOTAL, CKMB, CKMBINDEX, TROPONINI,  in the last 168 hours  BNP (last 3 results) No results found for this basename: PROBNP,  in the last 8760 hours CBG:  Recent Labs Lab 01/19/13 2031  GLUCAP >600*    Radiological Exams on Admission: No results found.  EKG: Independently reviewed.  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 Hyperglycemia without ketosis - putting patient on insulin gtt to stabilize his BGLs before transitioning to [place] insulin.  No anion gap to suggest ketoacidosis  at this time and patient not ill appearing.  Giving 20 meq K po and monitoring BMPs to watch K as his BGL decreases. 2. 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    Code Status: Full (must indicate code status--if unknown or must be presumed, indicate so) Family Communication: No family in room (indicate person spoken with, if applicable, with phone number if by telephone) Disposition Plan: Admit to inpatient (indicate anticipated LOS)  Time spent: 30 min  Doyce Stonehouse M. [facility] Pager [phone]  If 7PM-7AM, please contact  night-coverage [email or web address] Password [email or web address] 01/19/2013, 11:12 PM

## 2013-01-20 ENCOUNTER — Encounter (HOSPITAL_COMMUNITY): Payer: Self-pay | Admitting: *Deleted

## 2013-01-20 LAB — BASIC METABOLIC PANEL
BUN: 9 mg/dL (ref 6–23)
BUN: 8 mg/dL (ref 6–23)
BUN: 7 mg/dL (ref 6–23)
BUN: 8 mg/dL (ref 6–23)
BUN: 8 mg/dL (ref 6–23)
BUN: 10 mg/dL (ref 6–23)
CO2: 29 mEq/L (ref 19–32)
CO2: 28 mEq/L (ref 19–32)
CO2: 27 mEq/L (ref 19–32)
CO2: 29 mEq/L (ref 19–32)
CO2: 30 mEq/L (ref 19–32)
CO2: 27 mEq/L (ref 19–32)
Calcium: 9.4 mg/dL (ref 8.4–10.5)
Calcium: 9 mg/dL (ref 8.4–10.5)
Calcium: 9 mg/dL (ref 8.4–10.5)
Calcium: 9.5 mg/dL (ref 8.4–10.5)
Calcium: 9.6 mg/dL (ref 8.4–10.5)
Calcium: 9.6 mg/dL (ref 8.4–10.5)
Chloride: 95 mEq/L — ABNORMAL LOW (ref 96–112)
Chloride: 99 mEq/L (ref 96–112)
Chloride: 101 mEq/L (ref 96–112)
Chloride: 96 mEq/L (ref 96–112)
Chloride: 97 mEq/L (ref 96–112)
Chloride: 97 mEq/L (ref 96–112)
Creatinine, Ser: 0.73 mg/dL (ref 0.50–1.35)
Creatinine, Ser: 0.69 mg/dL (ref 0.50–1.35)
Creatinine, Ser: 0.71 mg/dL (ref 0.50–1.35)
Creatinine, Ser: 0.79 mg/dL (ref 0.50–1.35)
Creatinine, Ser: 0.77 mg/dL (ref 0.50–1.35)
Creatinine, Ser: 0.82 mg/dL (ref 0.50–1.35)
GFR calc Af Amer: >90 mL/min (ref >90)
GFR calc Af Amer: >90 mL/min (ref >90)
GFR calc Af Amer: >90 mL/min (ref >90)
GFR calc Af Amer: >90 mL/min (ref >90)
GFR calc Af Amer: >90 mL/min (ref >90)
GFR calc Af Amer: >90 mL/min (ref >90)
GFR calc non Af Amer: >90 mL/min (ref >90)
GFR calc non Af Amer: >90 mL/min (ref >90)
GFR calc non Af Amer: >90 mL/min (ref >90)
GFR calc non Af Amer: >90 mL/min (ref >90)
GFR calc non Af Amer: >90 mL/min (ref >90)
GFR calc non Af Amer: >90 mL/min (ref >90)
Glucose, Bld: 412 mg/dL — ABNORMAL HIGH (ref 70–99)
Glucose, Bld: 236 mg/dL — ABNORMAL HIGH (ref 70–99)
Glucose, Bld: 163 mg/dL — ABNORMAL HIGH (ref 70–99)
Glucose, Bld: 274 mg/dL — ABNORMAL HIGH (ref 70–99)
Glucose, Bld: 189 mg/dL — ABNORMAL HIGH (ref 70–99)
Glucose, Bld: 139 mg/dL — ABNORMAL HIGH (ref 70–99)
Potassium: 4.2 mEq/L (ref 3.5–5.1)
Potassium: 3.3 mEq/L — ABNORMAL LOW (ref 3.5–5.1)
Potassium: 3.2 mEq/L — ABNORMAL LOW (ref 3.5–5.1)
Potassium: 3.6 mEq/L (ref 3.5–5.1)
Potassium: 3.2 mEq/L — ABNORMAL LOW (ref 3.5–5.1)
Potassium: 3.7 mEq/L (ref 3.5–5.1)
Sodium: 134 mEq/L — ABNORMAL LOW (ref 135–145)
Sodium: 138 mEq/L (ref 135–145)
Sodium: 137 mEq/L (ref 135–145)
Sodium: 134 mEq/L — ABNORMAL LOW (ref 135–145)
Sodium: 135 mEq/L (ref 135–145)
Sodium: 134 mEq/L — ABNORMAL LOW (ref 135–145)

## 2013-01-20 LAB — GLUCOSE, CAPILLARY
Glucose-Capillary: 301 mg/dL — ABNORMAL HIGH (ref 70–99)
Glucose-Capillary: 324 mg/dL — ABNORMAL HIGH (ref 70–99)
Glucose-Capillary: 399 mg/dL — ABNORMAL HIGH (ref 70–99)
Glucose-Capillary: 430 mg/dL — ABNORMAL HIGH (ref 70–99)
Glucose-Capillary: 263 mg/dL — ABNORMAL HIGH (ref 70–99)
Glucose-Capillary: 194 mg/dL — ABNORMAL HIGH (ref 70–99)
Glucose-Capillary: 212 mg/dL — ABNORMAL HIGH (ref 70–99)
Glucose-Capillary: 127 mg/dL — ABNORMAL HIGH (ref 70–99)
Glucose-Capillary: 145 mg/dL — ABNORMAL HIGH (ref 70–99)
Glucose-Capillary: 182 mg/dL — ABNORMAL HIGH (ref 70–99)
Glucose-Capillary: 162 mg/dL — ABNORMAL HIGH (ref 70–99)
Glucose-Capillary: 175 mg/dL — ABNORMAL HIGH (ref 70–99)
Glucose-Capillary: 159 mg/dL — ABNORMAL HIGH (ref 70–99)
Glucose-Capillary: 254 mg/dL — ABNORMAL HIGH (ref 70–99)
Glucose-Capillary: 265 mg/dL — ABNORMAL HIGH (ref 70–99)
Glucose-Capillary: 311 mg/dL — ABNORMAL HIGH (ref 70–99)
Glucose-Capillary: 216 mg/dL — ABNORMAL HIGH (ref 70–99)
Glucose-Capillary: 252 mg/dL — ABNORMAL HIGH (ref 70–99)
Glucose-Capillary: 105 mg/dL — ABNORMAL HIGH (ref 70–99)
Glucose-Capillary: 161 mg/dL — ABNORMAL HIGH (ref 70–99)
Glucose-Capillary: 138 mg/dL — ABNORMAL HIGH (ref 70–99)
Glucose-Capillary: 128 mg/dL — ABNORMAL HIGH (ref 70–99)
Glucose-Capillary: 131 mg/dL — ABNORMAL HIGH (ref 70–99)

## 2013-01-20 MED ORDER — POTASSIUM CHLORIDE 10 MEQ/100ML IV SOLN
10.0000 meq | INTRAVENOUS | Status: AC
  Administered 2013-01-20 (×2): 10 meq via INTRAVENOUS
  Filled 2013-01-20 (×2): qty 100

## 2013-01-20 MED ORDER — POTASSIUM CHLORIDE CRYS ER 20 MEQ PO TBCR
40.0000 meq | EXTENDED_RELEASE_TABLET | Freq: Two times a day (BID) | ORAL | Status: DC
  Administered 2013-01-20 – 2013-01-21 (×3): 40 meq via ORAL
  Filled 2013-01-20 (×6): qty 2

## 2013-01-20 MED ORDER — INSULIN ASPART 100 UNIT/ML ~~LOC~~ SOLN: 0.0000 [IU] | Freq: Every day | SUBCUTANEOUS | Status: DC

## 2013-01-20 MED ORDER — INSULIN GLARGINE 100 UNIT/ML ~~LOC~~ SOLN
25.0000 [IU] | Freq: Every day | SUBCUTANEOUS | Status: DC
  Administered 2013-01-20 – 2013-01-21 (×2): 25 [IU] via SUBCUTANEOUS
  Filled 2013-01-20 (×2): qty 0.25

## 2013-01-20 MED ORDER — INSULIN ASPART 100 UNIT/ML ~~LOC~~ SOLN
0.0000 [IU] | Freq: Three times a day (TID) | SUBCUTANEOUS | Status: DC
Start: 2013-01-21 — End: 2013-01-21
  Administered 2013-01-21 (×2): 7 [IU] via SUBCUTANEOUS

## 2013-01-20 NOTE — Progress Notes (Signed)
Dr Blake Divine called to notify about increased CBG. Clarified with Dr Blake Divine the cont IVF orders. See Mar. Will continue to monitor.

## 2013-01-20 NOTE — Progress Notes (Signed)
[facility] PROGRESS NOTE  [name] VHQ:[id number] DOB: [birth year] DOA: 01/19/2013 PCP: Talmage Coin, MD  Assessment/Plan:  1. Hyperglycemia without ketosis - putting patient on insulin gtt to stabilize his BGLs before transitioning to [place] insulin. No anion gap to suggest ketoacidosis at this time and patient not ill appearing. Giving 20 meq K po and monitoring BMPs to watch K as his BGL decreases. 2. 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 Code Status: Full ( Family Communication: No family in room   Disposition Plan: pending.    Consultants:  none  Procedures:  none  Antibiotics:  none  HPI/Subjective: No complaints.   Objective: Filed Vitals:   01/20/13 1450  BP: 139/84  Pulse: 78  Temp: 98.3 F (36.8 C)  Resp: 18    Intake/Output Summary (Last 24 hours) at 01/20/13 1727 Last data filed at 01/20/13 1556  Gross per 24 hour  Intake   3422 ml  Output   1400 ml  Net   2022 ml   Filed Weights   01/19/13 2032  Weight: 136.703 kg (301 lb 6 oz)    Exam:  Cardiovascular: RRR w/o MRG  Respiratory: CTA B  Abdomen: soft, nt, nd, bs+  Skin: no rash nor lesion  Musculoskeletal: MAE, full ROM all 4 extremities   Data Reviewed: Basic Metabolic Panel:  Recent Labs Lab 01/19/13 2035 01/19/13 2050 01/20/13 0022 01/20/13 0405 01/20/13 1050 01/20/13 1502  NA 123* 126* 134* 138 137 134*  K 4.8 4.8 4.2 3.3* 3.2* 3.6  CL 82* 88* 95* 99 101 96  CO2 27  --  29 28 27 29   GLUCOSE 915* >700* 412* 236* 163* 274*  BUN 11 11 9 8 7 8   CREATININE 0.80 1.10 0.73 0.69 0.71 0.79  CALCIUM 10.1  --  9.4 9.0 9.0 9.5   Liver Function Tests: No results found for this basename: AST, ALT, ALKPHOS, BILITOT, PROT, ALBUMIN,  in the last 168 hours No results found for this basename: LIPASE, AMYLASE,  in the last 168 hours No results found for this basename: AMMONIA,  in the last 168 hours CBC:  Recent Labs Lab 01/19/13 2035  01/19/13 2050  WBC 4.4  --   NEUTROABS 2.6  --   HGB 16.2 17.7*  HCT 46.2 52.0  MCV 80.8  --   PLT 205  --    Cardiac Enzymes: No results found for this basename: CKTOTAL, CKMB, CKMBINDEX, TROPONINI,  in the last 168 hours BNP (last 3 results) No results found for this basename: PROBNP,  in the last 8760 hours CBG:  Recent Labs Lab 01/20/13 1238 01/20/13 1344 01/20/13 1447 01/20/13 1554 01/20/13 1702  GLUCAP 254* 265* 311* 216* 252*    No results found for this or any previous visit (from the past 240 hour(s)).   Studies: No results found.  Scheduled Meds: . insulin regular  0-10 Units Intravenous TID WC  . potassium chloride  40 mEq Oral BID   Continuous Infusions: . sodium chloride 125 mL/hr at 01/20/13 1658  . dextrose 5 % and 0.45% NaCl Stopped (01/20/13 1658)  . 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 If 7PM-7AM, please contact night-coverage at [email or web address], password [email or web address] 01/20/2013, 5:27 PM  LOS: 1 day

## 2013-01-20 NOTE — Progress Notes (Signed)
Utilization Review Completed.Remy Dia T10/19/2014  

## 2013-01-21 LAB — BASIC METABOLIC PANEL
BUN: 10 mg/dL (ref 6–23)
BUN: 10 mg/dL (ref 6–23)
CO2: 25 mEq/L (ref 19–32)
CO2: 25 mEq/L (ref 19–32)
Calcium: 8.9 mg/dL (ref 8.4–10.5)
Calcium: 8.9 mg/dL (ref 8.4–10.5)
Chloride: 97 mEq/L (ref 96–112)
Chloride: 97 mEq/L (ref 96–112)
Creatinine, Ser: 0.7 mg/dL (ref 0.50–1.35)
Creatinine, Ser: 0.76 mg/dL (ref 0.50–1.35)
GFR calc Af Amer: >90 mL/min (ref >90)
GFR calc Af Amer: >90 mL/min (ref >90)
GFR calc non Af Amer: >90 mL/min (ref >90)
GFR calc non Af Amer: >90 mL/min (ref >90)
Glucose, Bld: 250 mg/dL — ABNORMAL HIGH (ref 70–99)
Glucose, Bld: 337 mg/dL — ABNORMAL HIGH (ref 70–99)
Potassium: 4 mEq/L (ref 3.5–5.1)
Potassium: 4.9 mEq/L (ref 3.5–5.1)
Sodium: 132 mEq/L — ABNORMAL LOW (ref 135–145)
Sodium: 132 mEq/L — ABNORMAL LOW (ref 135–145)

## 2013-01-21 LAB — GLUCOSE, CAPILLARY
Glucose-Capillary: 169 mg/dL — ABNORMAL HIGH (ref 70–99)
Glucose-Capillary: 284 mg/dL — ABNORMAL HIGH (ref 70–99)
Glucose-Capillary: 310 mg/dL — ABNORMAL HIGH (ref 70–99)
Glucose-Capillary: 318 mg/dL — ABNORMAL HIGH (ref 70–99)
Glucose-Capillary: 347 mg/dL — ABNORMAL HIGH (ref 70–99)

## 2013-01-21 MED ORDER — INSULIN ASPART 100 UNIT/ML ~~LOC~~ SOLN
0.0000 [IU] | SUBCUTANEOUS | Status: DC
  Administered 2013-01-21 – 2013-01-22 (×2): 15 [IU] via SUBCUTANEOUS
  Administered 2013-01-22: 7 [IU] via SUBCUTANEOUS

## 2013-01-21 MED ORDER — INSULIN GLARGINE 100 UNIT/ML ~~LOC~~ SOLN
35.0000 [IU] | Freq: Every day | SUBCUTANEOUS | Status: DC
  Administered 2013-01-22: 35 [IU] via SUBCUTANEOUS
  Filled 2013-01-21: qty 0.35

## 2013-01-21 MED ORDER — INSULIN ASPART 100 UNIT/ML ~~LOC~~ SOLN: 0.0000 [IU] | Freq: Three times a day (TID) | SUBCUTANEOUS | Status: DC

## 2013-01-21 MED ORDER — INSULIN GLARGINE 100 UNIT/ML ~~LOC~~ SOLN: 30.0000 [IU] | Freq: Every day | SUBCUTANEOUS | Status: DC

## 2013-01-21 MED ORDER — INSULIN ASPART 100 UNIT/ML ~~LOC~~ SOLN
5.0000 [IU] | Freq: Three times a day (TID) | SUBCUTANEOUS | Status: DC
  Administered 2013-01-22: 5 [IU] via SUBCUTANEOUS

## 2013-01-21 MED ORDER — INSULIN ASPART 100 UNIT/ML ~~LOC~~ SOLN
2.0000 [IU] | Freq: Three times a day (TID) | SUBCUTANEOUS | Status: DC
  Administered 2013-01-21 (×2): 2 [IU] via SUBCUTANEOUS

## 2013-01-21 NOTE — Progress Notes (Signed)
01/21/13 Checked with Case Management assistant doirector, patient not eligible for [facility] program. Spoke with patient about need for assistance with cost of insulin. Gave him a coupon for Lantus with $25 copay and Novolog with $25 copay. Patient stated that he would be able to afford the $25 copays. Jacquelynn Cree RN, BSN, CCM

## 2013-01-21 NOTE — Progress Notes (Signed)
[facility] PROGRESS NOTE  [name] WJX:[id number] DOB: 05-04-1985 DOA: 01/19/2013 PCP: Talmage Coin, MD  Assessment/Plan:  1. Hyperglycemia without ketosis -  He was initially put  on insulin gtt to stabilize his BGLs before transitioning to [place] insulin. No anion gap to suggest ketoacidosis at this time and patient not ill appearing. His CBG'S were better controlled and he was started on SQ LANTUS AND SSI.  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 Code Status: Full  Family Communication: No family in room   Disposition Plan: pending.    Consultants:  none  Procedures:  none  Antibiotics:  none  HPI/Subjective: No complaints.   Objective: Filed Vitals:   01/21/13 1325  BP: 136/70  Pulse: 67  Temp: 98.2 F (36.8 C)  Resp: 18    Intake/Output Summary (Last 24 hours) at 01/21/13 1801 Last data filed at 01/21/13 1300  Gross per 24 hour  Intake 2299.58 ml  Output      0 ml  Net 2299.58 ml   Filed Weights   01/19/13 2032  Weight: 136.703 kg (301 lb 6 oz)    Exam:  Cardiovascular: RRR w/o MRG  Respiratory: CTA B  Abdomen: soft, nt, nd, bs+  Skin: no rash nor lesion  Musculoskeletal: MAE, full ROM all 4 extremities   Data Reviewed: Basic Metabolic Panel:  Recent Labs Lab 01/20/13 1502 01/20/13 1600 01/20/13 2203 01/21/13 0103 01/21/13 0513  NA 134* 135 134* 132* 132*  K 3.6 3.2* 3.7 4.0 4.9  CL 96 97 97 97 97  CO2 29 30 27 25 25   GLUCOSE 274* 189* 139* 250* 337*  BUN 8 8 10 10 10   CREATININE 0.79 0.77 0.82 0.70 0.76  CALCIUM 9.5 9.6 9.6 8.9 8.9   Liver Function Tests: No results found for this basename: AST, ALT, ALKPHOS, BILITOT, PROT, ALBUMIN,  in the last 168 hours No results found for this basename: LIPASE, AMYLASE,  in the last 168 hours No results found for this basename: AMMONIA,  in the last 168 hours CBC:  Recent Labs Lab 01/19/13 2035 01/19/13 2050  WBC 4.4  --   NEUTROABS 2.6   --   HGB 16.2 17.7*  HCT 46.2 52.0  MCV 80.8  --   PLT 205  --    Cardiac Enzymes: No results found for this basename: CKTOTAL, CKMB, CKMBINDEX, TROPONINI,  in the last 168 hours BNP (last 3 results) No results found for this basename: PROBNP,  in the last 8760 hours CBG:  Recent Labs Lab 01/20/13 2244 01/20/13 2354 01/21/13 0704 01/21/13 1103 01/21/13 1630  GLUCAP 131* 169* 318* 347* 310*    No results found for this or any previous visit (from the past 240 hour(s)).   Studies: No results found.  Scheduled Meds: . insulin aspart  0-20 Units Subcutaneous Q4H  . [START ON 01/22/2013] insulin aspart  5 Units Subcutaneous TID WC  . [START ON 01/22/2013] insulin glargine  35 Units Subcutaneous Daily  . potassium chloride  40 mEq Oral BID   Continuous Infusions: . 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 If 7PM-7AM, please contact night-coverage at [email or web address], password [facility] 01/21/2013, 6:01 PM  LOS: 2 days

## 2013-01-21 NOTE — Progress Notes (Signed)
Inpatient Diabetes Program Recommendations  AACE/ADA: New Consensus Statement on Inpatient Glycemic Control (2013)  Target Ranges:  Prepandial:   less than 140 mg/dL      Peak postprandial:   less than 180 mg/dL (1-2 hours)      Critically ill patients:  140 - 180 mg/dL  Results for [name], [name] ([name]) as of 01/21/2013 11:16  Ref. Range 01/19/2013 20:35  Glucose Latest Range: 70-99 mg/dL [facility])    Results for [name], [name] ([name]) as of 01/21/2013 11:16  Ref. Range 01/20/2013 08:16 01/20/2013 09:24 01/20/2013 10:29 01/20/2013 11:35 01/20/2013 12:38 01/20/2013 13:44 01/20/2013 14:47 01/20/2013 15:54 01/20/2013 17:02 01/20/2013 18:20 01/20/2013 19:26 01/20/2013 20:29  Glucose-Capillary Latest Range: 70-99 mg/dL 956 (H) 213 (H) 086 (H) 159 (H) 254 (H) 265 (H) 311 (H) 216 (H) 252 (H) 105 (H) 161 (H) 138 (H)   Results for [name], [name] ([name]) as of 01/21/2013 11:16  Ref. Range 01/20/2013 21:35 01/20/2013 22:44 01/20/2013 23:54 01/21/2013 07:04 01/21/2013 11:03  Glucose-Capillary Latest Range: 70-99 mg/dL 528 (H) 413 (H) 244 (H) 318 (H) 347 (H)    Inpatient Diabetes Program Recommendations Insulin - Basal: Please consider increasing Lantus to 50 units QHS (patient received Lantus 25 units on 10/19 at 22:46). Correction (SSI): Please increase Novolog correction to resistant scale. HgbA1C: Please order an A1C to determine glycemic control over the past 2-3 months.  Note: Patient has a history of diabetes and is prescribed lantus 25-35 units QAM as an outpatient for diabetes management.  Patient presented to the ED with lab glucose of 915 mg/dl on 01/02 @ 72:53.  In reviewing the chart, there is a note by Danford Bad, RN on 10/18 @ 20:33 "Pt states that he is from [place] and had a PCP that wrote for his medications. Pt states that he changed insurances and the insurance company wants him to see a MD prior to filling his insulin. Pt states that he has been off  insulin for 2 weeks. Pt states today he checked his CBG and his glucose meter ran high. Pt only uses insulin occasionally and is usually tightly controlled by diet."    I called patient and spoke with him over the phone.  He reports that he was living in [place] and was on his parents insurance until about a month or two ago.  While he was on his parents insurance he was able to see physicians in [place] and was able to get his prescriptions filled in [place].  However after changing to [employer] he was informed that they will only pay for him to see physicians in [place] and will only fill prescriptions in [place].  Patient also states that he was told the insurance would not pay for any medications until he is seen by a physician in [place].  Patient reports that when he was diagnosed with diabetes he was initially started on Metformin but it cause him to have GI upset with vomiting so Metformin was stopped and he was started on Invokana 100 mg daily.  Patient states that the Invokana dose was increased up (either 200 mg or 300 mg daily) but unsure of the exact dosage.  When he was also given a prescription for Lantus in case his blood glucose was elevated.  Patient reports that he rarely took the Lantus because the Invokana kept his blood glucose fairly controlled.  Patient saw Dr. Sharl Ma at [facility] in June 2014 while he was still under his parents insurance.  At  his office visit with Dr. Sharl Ma he states that his A1C was "good" (unsure of the number).  Patient reports that fasting glucose was in the 120's mg/dl and post prandial glucose was in the 180's mg/dl while he was taking the Invokana.  In discussing patients plan for diabetes management, he states that he is going to make an appointment with a physician in [place] so he can get his prescriptions for diabetes control.  Patient reports that Dr. Daune Perch office is aware of the issues with the insurance company and has been working with  him and [employer] to resolve the issue of only covering physicians in [place].     Initially patient was placed on an insulin drip and has been transitioned to SQ insulin.  Patient received lantus 25 units on 10/19 @ 22:46 and insulin drip was stopped an hour later and blood glucose was 169 mg/dl at 40:98.  Currently, patient is ordered to receive Lantus 25 units daily, Novolog 0-9 units AC, and Novolog 0-5 units HS for inpatient glycemic control. Fasting glucose this morning was 318 mg/dl and 119 mg/dl at 14:78.  Please increase Lantus to 50 units QHS, increase Novolog correction to resistant scale, and order an A1C.  Will continue to follow.  Thanks, Max Penner, RN, MSN, CCRN Diabetes Coordinator Inpatient Diabetes Program [phone] (Team Pager) [phone] (AP office) [phone] [facility] office)

## 2013-01-22 LAB — GLUCOSE, CAPILLARY
Glucose-Capillary: 214 mg/dL — ABNORMAL HIGH (ref 70–99)
Glucose-Capillary: 304 mg/dL — ABNORMAL HIGH (ref 70–99)
Glucose-Capillary: 177 mg/dL — ABNORMAL HIGH (ref 70–99)

## 2013-01-22 MED ORDER — INSULIN ASPART 100 UNIT/ML ~~LOC~~ SOLN: 5.0000 [IU] | Freq: Three times a day (TID) | SUBCUTANEOUS | Status: DC

## 2013-01-22 MED ORDER — INSULIN ASPART 100 UNIT/ML ~~LOC~~ SOLN: 0.0000 [IU] | Freq: Three times a day (TID) | SUBCUTANEOUS | Status: DC

## 2013-01-22 MED ORDER — INSULIN ASPART 100 UNIT/ML CARTRIDGE (PENFILL): 5.0000 [IU] | Freq: Three times a day (TID) | SUBCUTANEOUS | Status: DC

## 2013-01-22 MED ORDER — INSULIN ASPART 100 UNIT/ML ~~LOC~~ SOLN
0.0000 [IU] | Freq: Three times a day (TID) | SUBCUTANEOUS | Status: DC
  Administered 2013-01-22: 3 [IU] via SUBCUTANEOUS

## 2013-01-22 MED ORDER — INSULIN GLARGINE 100 UNIT/ML ~~LOC~~ SOLN: 45.0000 [IU] | Freq: Every day | SUBCUTANEOUS | Status: DC

## 2013-01-22 MED ORDER — INSULIN ASPART 100 UNIT/ML ~~LOC~~ SOLN
8.0000 [IU] | Freq: Three times a day (TID) | SUBCUTANEOUS | Status: DC
  Administered 2013-01-22 (×2): 8 [IU] via SUBCUTANEOUS

## 2013-01-22 MED ORDER — INSULIN GLARGINE 100 UNIT/ML ~~LOC~~ SOLN: 40.0000 [IU] | Freq: Every day | SUBCUTANEOUS | Status: DC

## 2013-01-22 MED ORDER — INSULIN GLARGINE 100 UNIT/ML SOLOSTAR PEN: 40.0000 [IU] | PEN_INJECTOR | Freq: Every morning | SUBCUTANEOUS | Status: DC

## 2013-01-22 NOTE — Discharge Summary (Signed)
Physician Discharge Summary  [name] ZOX:[id number] DOB: [birth year] DOA: 01/19/2013  PCP: Talmage Coin, MD  Admit date: 01/19/2013 Discharge date: 01/22/2013  Time spent: 30 minutes  Recommendations for Outpatient Follow-up:  1. 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  Diet recommendation: CARB MODIFIED DIET  Filed Weights   01/19/13 2032  Weight: 136.703 kg (301 lb 6 oz)    History of present illness:  [name] is a 27 y.o. male with h/o DM, had previously been on metformin but had stopped this some time back, was supposed to be started on insulin glargine but apparently his insurance company did not want to cover this medication, as a result he has been on no medication for his DM. Unfortunately over the past couple of days he has developed worsening polydipsia, polyuria, and polyphagia, and his home BGL meter has read "high" he presented to the ED as a result.   Hospital Course:   Hyperglycemia without ketosis/ uncontrolled diabetes mellitus - He was initially put on insulin gtt to stabilize his BGLs before transitioning to [place] insulin. No anion gap to suggest ketoacidosis at this time and patient not ill appearing. His CBG'S were better controlled and he was started on SQ LANTUS AND SSI. He was discharged on 40 units of sq lantus and 5 units of novolog TIDAC and SSI.   Procedures:  none  Consultations:  none Discharge Exam: Filed Vitals:   01/22/13 0548  BP: 122/55  Pulse: 59  Temp: 98.4 F (36.9 C)  Resp: 18    General: alert afebrile comfortable Cardiovascular: s1s2 Respiratory: ctab  Discharge Instructions  Discharge Orders   Future Orders Complete By Expires   Discharge instructions  As directed    Comments:     Follow up with PCP in one week.       Medication List     STOP taking these medications       INVOKANA 300 MG Tabs  Generic drug:  Canagliflozin      TAKE these medications       insulin aspart 100 UNIT/ML injection  Commonly known as:  novoLOG  Inject 5 Units into the skin 3 (three) times daily with meals.     insulin aspart 100 UNIT/ML injection  Commonly known as:  novoLOG  Inject 0-15 Units into the skin 3 (three) times daily with meals.  Start taking on:  01/23/2013     insulin glargine 100 UNIT/ML injection  Commonly known as:  LANTUS  Inject 0.4 mLs (40 Units total) into the skin daily.       No Known Allergies     Follow-up Information   Follow up with KERR,JEFFREY, MD. Schedule an appointment as soon as possible for a visit in 1 week.   Specialty:  Endocrinology   Contact information:   [address] [place] [address] [phone]        The results of significant diagnostics from this hospitalization (including imaging, microbiology, ancillary and laboratory) are listed below for reference.    Significant Diagnostic Studies: No results found.  Microbiology: No results found for this or any previous visit (from the past 240 hour(s)).   Labs: Basic Metabolic Panel:  Recent Labs Lab 01/20/13 1502 01/20/13 1600 01/20/13 2203 01/21/13 0103 01/21/13 0513  NA 134* 135 134* 132* 132*  K 3.6  3.2* 3.7 4.0 4.9  CL 96 97 97 97 97  CO2 29 30 27 25 25   GLUCOSE 274* 189* 139* 250* 337*  BUN 8 8 10 10 10   CREATININE 0.79 0.77 0.82 0.70 0.76  CALCIUM 9.5 9.6 9.6 8.9 8.9   Liver Function Tests: No results found for this basename: AST, ALT, ALKPHOS, BILITOT, PROT, ALBUMIN,  in the last 168 hours No results found for this basename: LIPASE, AMYLASE,  in the last 168 hours No results found for this basename: AMMONIA,  in the last 168 hours CBC:  Recent Labs Lab 01/19/13 2035 01/19/13 2050  WBC 4.4  --   NEUTROABS 2.6  --   HGB 16.2 17.7*  HCT 46.2 52.0  MCV 80.8  --   PLT 205  --     Cardiac Enzymes: No results found for this basename: CKTOTAL, CKMB, CKMBINDEX, TROPONINI,  in the last 168 hours BNP: BNP (last 3 results) No results found for this basename: PROBNP,  in the last 8760 hours CBG:  Recent Labs Lab 01/21/13 1103 01/21/13 1630 01/21/13 2150 01/22/13 0633 01/22/13 1135  GLUCAP 347* 310* 284* 214* 304*       Signed:  Sylvio Weatherall  [facility] 01/22/2013, 5:06 PM

## 2014-05-25 ENCOUNTER — Emergency Department (INDEPENDENT_AMBULATORY_CARE_PROVIDER_SITE_OTHER)
Admission: EM | Admit: 2014-05-25 | Discharge: 2014-05-25 | Disposition: A | Discharge status: Home / Self Care | Payer: BC Managed Care – PPO | Source: Home / Self Care | Attending: Family Medicine | Admitting: Family Medicine

## 2014-05-25 ENCOUNTER — Encounter (HOSPITAL_COMMUNITY): Payer: Self-pay | Admitting: *Deleted

## 2014-05-25 DIAGNOSIS — R109 Unspecified abdominal pain: Secondary | ICD-10-CM

## 2014-05-25 DIAGNOSIS — T50905A Adverse effect of unspecified drugs, medicaments and biological substances, initial encounter: Secondary | ICD-10-CM

## 2014-05-25 DIAGNOSIS — R110 Nausea: Secondary | ICD-10-CM

## 2014-05-25 DIAGNOSIS — R197 Diarrhea, unspecified: Secondary | ICD-10-CM

## 2014-05-25 DIAGNOSIS — T887XXA Unspecified adverse effect of drug or medicament, initial encounter: Secondary | ICD-10-CM

## 2014-05-25 MED ORDER — DIPHENOXYLATE-ATROPINE 2.5-0.025 MG PO TABS: 2.0000 | ORAL_TABLET | Freq: Four times a day (QID) | ORAL | Status: DC | PRN

## 2014-05-25 NOTE — ED Provider Notes (Signed)
CSN: [id number]     Arrival date & time 05/25/14  1604 History   First MD Initiated Contact with Patient 05/25/14 1632     Chief Complaint  Patient presents with  . Abdominal Pain   (Consider location/radiation/quality/duration/timing/severity/associated sxs/prior Treatment) Patient is a 29 y.o. male presenting with abdominal pain. The history is provided by the patient.  Abdominal Pain Pain location:  LLQ and RLQ Pain quality: cramping   Pain radiates to:  Does not radiate Pain severity:  Mild Duration:  10 days Progression:  Unchanged Chronicity:  New Context comment:  10 stools a day. after starting new weekly injection of diabetes med, Associated symptoms: diarrhea and nausea   Associated symptoms: no constipation, no fever and no vomiting     Past Medical History  Diagnosis Date  . Diabetes mellitus   . Hypertension    History reviewed. No pertinent past surgical history. Family History  Problem Relation Age of Onset  . Hypertension Other   . Diabetes Other    History  Substance Use Topics  . Smoking status: Never Smoker   . Smokeless tobacco: Not on file  . Alcohol Use: 1.2 oz/week    2 Cans of beer per week    Review of Systems  Constitutional: Negative.  Negative for fever.  Gastrointestinal: Positive for nausea, abdominal pain and diarrhea. Negative for vomiting and constipation.  Musculoskeletal: Negative.   Skin: Negative.     Allergies  Review of patient's allergies indicates no known allergies.  Home Medications   Prior to Admission medications   Medication Sig Start Date End Date Taking? Authorizing Provider  Canagliflozin (INVOKANA PO) Take by mouth.   Yes Historical Provider, MD  diphenoxylate-atropine (LOMOTIL) 2.5-0.025 MG per tablet Take 2 tablets by mouth 4 (four) times daily as needed for diarrhea or loose stools. 05/25/14   Linna HoffJames D Terresa Marlett, MD  insulin aspart (NOVOLOG) 100 UNIT/ML injection Inject 0-15 Units into the skin 3 (three) times  daily with meals. 01/23/13   Kathlen ModyVijaya Akula, MD  insulin aspart (NOVOLOG) 100 UNIT/ML SOCT cartridge Inject 5 Units into the skin 3 (three) times daily with meals. 01/22/13   Kathlen ModyVijaya Akula, MD  Insulin Glargine 100 UNIT/ML SOPN Inject 40 Units into the skin every morning. 01/22/13   Kathlen ModyVijaya Akula, MD   BP 134/84 mmHg  Pulse 78  Temp(Src) 98.6 F (37 C) (Oral)  Resp 16  SpO2 100% Physical Exam  Constitutional: He is oriented to person, place, and time. He appears well-developed and well-nourished. No distress.  HENT:  Mouth/Throat: Oropharynx is clear and moist.  Abdominal: Soft. Bowel sounds are normal. There is no tenderness.  Neurological: He is alert and oriented to person, place, and time.  Skin: Skin is warm and dry.  Nursing note and vitals reviewed.   ED Course  Procedures (including critical care time) Labs Review Labs Reviewed - No data to display  Imaging Review No results found.   MDM   1. Medication adverse effect, initial encounter        Linna HoffJames D Dacie Mandel, MD 05/25/14 [phone]

## 2014-05-25 NOTE — ED Notes (Addendum)
Low   abd  Pain         With   Cramping        Pt    Also       Nausea        No  Diarrhea               Symptoms  X  10  Days                        Pt is  A  Diabetic       Sitting  Upright      On  Exam  Table      denys  Any  Vomiting           Sitting  Upright  On  The  Exam  Table  Speaking in  Complete  sentances  No  Severe   Distress

## 2014-05-25 NOTE — Discharge Instructions (Signed)
Clear liquid , bland diet tonight as tolerated, advance on mon as improved, use medicine as needed, probiotic Align twice a day,  see your doctor on mon to further discuss

## 2014-10-30 ENCOUNTER — Ambulatory Visit (INDEPENDENT_AMBULATORY_CARE_PROVIDER_SITE_OTHER): Payer: BC Managed Care – PPO | Admitting: Family Medicine

## 2014-10-30 VITALS — BP 132/80 | HR 89 | Temp 98.2°F | Resp 16 | Ht 77.0 in | Wt 260.4 lb

## 2014-10-30 DIAGNOSIS — Z111 Encounter for screening for respiratory tuberculosis: Secondary | ICD-10-CM | POA: Diagnosis not present

## 2014-10-30 DIAGNOSIS — Z23 Encounter for immunization: Secondary | ICD-10-CM | POA: Diagnosis not present

## 2014-10-30 DIAGNOSIS — Z0000 Encounter for general adult medical examination without abnormal findings: Secondary | ICD-10-CM | POA: Diagnosis not present

## 2014-10-30 NOTE — Patient Instructions (Signed)
Good to see you today You got your tetanus booster  Be sure to come back and get your TB test read

## 2014-10-30 NOTE — Progress Notes (Signed)
[facility] [address] [place] [address] [phone]  Date:  10/30/2014   Name:  [name]   DOB:  1985-10-23   MRN:  [id number]  PCP:  Talmage Coin, MD    Chief Complaint: Annual Exam   History of Present Illness:  Max Yeske is a 29 y.o. very pleasant male patient who presents with the following:  PMH of DM, currently controlled using lantus as directed, presents for a pre-employment physical exam. Pt denies any changes in vision, numbness and tingling in his extremities. He sees an endocrinologist (Dr. Sharl Ma), ophthalmologist, and podiatrist regularly for his DM. 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 He needs a PPD and a Tdap today- unsure of the date of last tetanus He is otherwise feeling well and has no other concerns today  Patient Active Problem List   Diagnosis Date Noted  . 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 Hyperglycemia without ketosis 01/19/2013    Past Medical History  Diagnosis Date  . Diabetes mellitus   . Hypertension     History reviewed. No pertinent past surgical history.  History  Substance Use Topics  . Smoking status: Never Smoker   . Smokeless tobacco: Never Used  . Alcohol Use: No    Family History  Problem Relation Age of Onset  . Hypertension Other   . Diabetes Other     No Known Allergies  Medication list has been reviewed and updated.  Current Outpatient Prescriptions on File Prior to Visit  Medication Sig Dispense Refill  . Canagliflozin (INVOKANA PO) Take by mouth.    . diphenoxylate-atropine (LOMOTIL) 2.5-0.025 MG per tablet Take 2 tablets by mouth 4 (four) times daily as needed for diarrhea or loose stools. (Patient not taking: Reported on 10/30/2014) 20 tablet 0  . insulin aspart (NOVOLOG) 100 UNIT/ML injection Inject 0-15 Units into the skin 3 (three) times daily with meals.  (Patient not taking: Reported on 10/30/2014) 1 vial 12  . insulin aspart (NOVOLOG) 100 UNIT/ML SOCT cartridge Inject 5 Units into the skin 3 (three) times daily with meals. (Patient not taking: Reported on 10/30/2014) 30 mL 1  . Insulin Glargine 100 UNIT/ML SOPN Inject 40 Units into the skin every morning. (Patient not taking: Reported on 10/30/2014) 30 mL 0   No current facility-administered medications on file prior to visit.    Review of Systems: Constitutional: no fever, chills, night sweats HEENT: denies changes in vision, hearing, nasal congestion, and sore throat CV: denies chest pain and palpitations Pulm: denies SOB and cough GI: denies abdominal pain, N/V/D, hematochezia GU: denies urinary frequency, dysuria, hematuria MSK: denies myalgias or muscle injury  Physical Examination: Filed Vitals:   10/30/14 1305  BP: 132/80  Pulse: 89  Temp: 98.2 F (36.8 C)  Resp: 16   Filed Vitals:   10/30/14 1305  Height: 6\' 5"  (1.956 m)  Weight: 260 lb 6.4 oz (118.117 kg)   Body mass index is 30.87 kg/(m^2). Ideal Body Weight: Weight in (lb) to have BMI = 25: 210.4  HEENT: PERRLA, TMs in tact with appropriate cone of light and visible landmarks, nares are patent with no polyps, uvula is midline and oral mucosa looks healthy and pink, no exudates Neck: trachea midline, no cervical LAD CV: RRR no m/r/g, distal pulses 2+ BL Pulm: CTABL, no wheezes or rhonchi GI: soft, non-distended, nontender, bowl sounds ausculted  in all 4 quadrants, no masses GU: deferred Neuro: biceps, triceps, patellar, achilles reflexes 2+ BL Skin: no rashes or lesions Assessment and Plan: Physical exam  Screening for tuberculosis - Plan: TB Skin Test  Immunization due - Plan: Tdap vaccine greater than or equal to 7yo IM   Signed Abbe Amsterdam, MD

## 2014-11-02 ENCOUNTER — Ambulatory Visit (INDEPENDENT_AMBULATORY_CARE_PROVIDER_SITE_OTHER): Payer: BC Managed Care – PPO | Admitting: Family Medicine

## 2014-11-02 DIAGNOSIS — Z111 Encounter for screening for respiratory tuberculosis: Secondary | ICD-10-CM

## 2014-11-02 LAB — TB SKIN TEST
Induration: 0 mm
TB Skin Test: NEGATIVE

## 2014-11-02 NOTE — Progress Notes (Signed)
   Subjective:    Patient ID: [name], male    DOB: Jun 12, 1985, 29 y.o.   MRN: [id number]  HPI  TB read only  Review of Systems     Objective:   Physical Exam  0 mm induration       Assessment & Plan:  No further workup indicated for negative TB results

## 2014-12-28 ENCOUNTER — Encounter (HOSPITAL_COMMUNITY): Payer: Self-pay | Admitting: *Deleted

## 2014-12-28 ENCOUNTER — Emergency Department (INDEPENDENT_AMBULATORY_CARE_PROVIDER_SITE_OTHER)
Admission: EM | Admit: 2014-12-28 | Discharge: 2014-12-28 | Disposition: A | Discharge status: Home / Self Care | Payer: BC Managed Care – PPO | Source: Home / Self Care | Attending: Emergency Medicine | Admitting: Emergency Medicine

## 2014-12-28 DIAGNOSIS — J40 Bronchitis, not specified as acute or chronic: Secondary | ICD-10-CM

## 2014-12-28 MED ORDER — AZITHROMYCIN 250 MG PO TABS: ORAL_TABLET | ORAL | Status: DC

## 2014-12-28 MED ORDER — HYDROCODONE-HOMATROPINE 5-1.5 MG/5ML PO SYRP: 5.0000 mL | ORAL_SOLUTION | Freq: Four times a day (QID) | ORAL | Status: DC | PRN

## 2014-12-28 MED ORDER — PREDNISONE 50 MG PO TABS: ORAL_TABLET | ORAL | Status: DC

## 2014-12-28 NOTE — ED Provider Notes (Signed)
CSN: [id number]     Arrival date & time 12/28/14  1825 History   First MD Initiated Contact with Patient 12/28/14 1835     Chief Complaint  Patient presents with  . Cough   (Consider location/radiation/quality/duration/timing/severity/associated sxs/prior Treatment) HPI He is a 29 year old man here for evaluation of cough.  He states his symptoms started about a week ago with cough. He denies any fevers or chills. No headaches, nasal congestion, rhinorrhea. No sore throat. He reports that the cough worsens at night. He has had several episodes of posttussive emesis. He also describes an occasional wheeze with coughing spasms. No shortness of breath. A colleague was sick with similar symptoms a week before his started.  He does have diabetes. He is taking Lantus. He states his sugars have been well-controlled.  Past Medical History  Diagnosis Date  . Diabetes mellitus   . Hypertension    History reviewed. No pertinent past surgical history. Family History  Problem Relation Age of Onset  . Hypertension Other   . Diabetes Other    Social History  Substance Use Topics  . Smoking status: Never Smoker   . Smokeless tobacco: Never Used  . Alcohol Use: No    Review of Systems As in history of present illness Allergies  Review of patient's allergies indicates no known allergies.  Home Medications   Prior to Admission medications   Medication Sig Start Date End Date Taking? Authorizing Provider  insulin glargine (LANTUS) 100 UNIT/ML injection Inject into the skin as needed.   Yes Historical Provider, MD  azithromycin (ZITHROMAX Z-PAK) 250 MG tablet Take 2 pills today, then 1 pill daily until gone. 12/28/14   Charm Rings, MD  HYDROcodone-homatropine (HYCODAN) 5-1.5 MG/5ML syrup Take 5 mLs by mouth every 6 (six) hours as needed for cough. 12/28/14   Charm Rings, MD  predniSONE (DELTASONE) 50 MG tablet Take 1 pill daily for 5 days. 12/28/14   Charm Rings, MD   Meds Ordered and  Administered this Visit  Medications - No data to display  BP 153/97 mmHg  Pulse 82  Temp(Src) 98 F (36.7 C) (Oral)  Resp 16  SpO2 100% No data found.   Physical Exam  Constitutional: He is oriented to person, place, and time. He appears well-developed and well-nourished. No distress.  HENT:  Mouth/Throat: Oropharynx is clear and moist. No oropharyngeal exudate.  Neck: Neck supple.  Cardiovascular: Normal rate, regular rhythm and normal heart sounds.   No murmur heard. Pulmonary/Chest: Effort normal and breath sounds normal. No respiratory distress. He has no wheezes. He has no rales.  Neurological: He is alert and oriented to person, place, and time.    ED Course  Procedures (including critical care time)  Labs Review Labs Reviewed - No data to display  Imaging Review No results found.    MDM   1. Bronchitis    Treat with prednisone and azithromycin. Hycodan as needed for cough. Follow-up as needed.    Charm Rings, MD 12/28/14 [phone]

## 2014-12-28 NOTE — Discharge Instructions (Signed)
You have bronchitis. °Take azithromycin and prednisone as prescribed. °Use hycodan as needed for cough. °You should see improvement in the next 3-5 days. °If you develop fevers, difficulty breathing, or are just not getting better, please come back or go to the emergency room. ° °

## 2014-12-28 NOTE — ED Notes (Signed)
C/O cough, occasionally slightly productive, x 1 wk without fevers.  Has some nasal congestion today; but has not been having nasal congestion or runny nose prior to today.

## 2015-03-04 ENCOUNTER — Encounter: Payer: Self-pay | Admitting: Podiatry

## 2015-03-04 ENCOUNTER — Ambulatory Visit (INDEPENDENT_AMBULATORY_CARE_PROVIDER_SITE_OTHER): Payer: BC Managed Care – PPO | Admitting: Podiatry

## 2015-03-04 ENCOUNTER — Ambulatory Visit (INDEPENDENT_AMBULATORY_CARE_PROVIDER_SITE_OTHER): Payer: BC Managed Care – PPO

## 2015-03-04 VITALS — BP 132/99 | HR 109 | Resp 16 | Ht 77.0 in | Wt 245.0 lb

## 2015-03-04 DIAGNOSIS — E119 Type 2 diabetes mellitus without complications: Secondary | ICD-10-CM

## 2015-03-04 DIAGNOSIS — M779 Enthesopathy, unspecified: Secondary | ICD-10-CM

## 2015-03-04 DIAGNOSIS — M2040 Other hammer toe(s) (acquired), unspecified foot: Secondary | ICD-10-CM

## 2015-03-04 MED ORDER — DICLOFENAC SODIUM 75 MG PO TBEC: 75.0000 mg | DELAYED_RELEASE_TABLET | Freq: Two times a day (BID) | ORAL | Status: DC

## 2015-03-04 MED ORDER — TRIAMCINOLONE ACETONIDE 10 MG/ML IJ SUSP
10.0000 mg | Freq: Once | INTRAMUSCULAR | Status: AC
  Administered 2015-03-04: 10 mg

## 2015-03-04 NOTE — Progress Notes (Signed)
   Subjective:    Patient ID: [name], male    DOB: May 27, 1985, 29 y.o.   MRN: [id number]  HPI Patient presents with bilateral foot pain; tingling & numbness in all toes; On & off since Aug. 2016; pt diabetic type 2; sugar=225 last night after ate.  Patient also presents with bilateral callouses; ball of feet.     Review of Systems  Musculoskeletal: Positive for gait problem.  All other systems reviewed and are negative.      Objective:   Physical Exam        Assessment & Plan:

## 2015-03-04 NOTE — Progress Notes (Signed)
Subjective:     Patient ID: [name], male   DOB: 13-Dec-1985, 29 y.o.   MRN: [id number]  HPI patient states I been getting a lot of pain in my left over my right foot and I have lost 150 pounds over the last several years. It's been really bothering me since August and my sugars been doing okay but not perfect. I do have flatfeet   Review of Systems     Objective:   Physical Exam  Constitutional: He is oriented to person, place, and time.  Cardiovascular: Intact distal pulses.   Musculoskeletal: Normal range of motion.  Neurological: He is oriented to person, place, and time.  Skin: Skin is warm.  Nursing note and vitals reviewed.  neurovascular status found to be intact muscle strength adequate range of motion within normal limits with patient having significant flatfoot deformity bilateral with inflammation left over right foot around the first metatarsal head with fluid buildup around the cyst Carney BernWendell complex. Good digital perfusion is noted and well oriented 3     Assessment:     Appears to be inflammatory sesamoiditis left with capsular inflammation and structural imbalances of the feet    Plan:     H&P and conditions reviewed with patient. Today I injected the tibial sesamoid complex left 3 mg Kenalog 5 mill grams Xylocaine and applied a dancer's pad with LowDye taping to take pressure off the foot and discussed long-term orthotics. Reappoint to recheck in 2 weeks and placed on diclofenac 75 mg twice a day

## 2015-03-18 ENCOUNTER — Other Ambulatory Visit: Payer: Self-pay | Admitting: Podiatry

## 2015-03-18 ENCOUNTER — Ambulatory Visit: Payer: BC Managed Care – PPO | Admitting: Podiatry

## 2015-03-18 ENCOUNTER — Telehealth: Payer: Self-pay | Admitting: *Deleted

## 2015-03-18 ENCOUNTER — Encounter: Payer: Self-pay | Admitting: Podiatry

## 2015-03-18 MED ORDER — DICLOFENAC SODIUM 75 MG PO TBEC: 75.0000 mg | DELAYED_RELEASE_TABLET | Freq: Two times a day (BID) | ORAL | Status: DC

## 2015-03-18 NOTE — Telephone Encounter (Signed)
Pt had to reschedule today's appt and states his medication was not called in at the last appt.  I called [facility] into [facility] [address].

## 2015-03-18 NOTE — Telephone Encounter (Signed)
Pt had to reschedule today's appt, but was uncertain if a rx was called to his pharmacy.  I reviewed pt's 03/04/2015 clinicals and medication orders and Diclofenac [facility] [address].  Pt is informed.

## 2015-03-19 ENCOUNTER — Encounter: Payer: Self-pay | Admitting: Podiatry

## 2015-03-19 ENCOUNTER — Ambulatory Visit (INDEPENDENT_AMBULATORY_CARE_PROVIDER_SITE_OTHER): Payer: BC Managed Care – PPO | Admitting: Podiatry

## 2015-03-19 VITALS — BP 137/91 | HR 90 | Resp 16

## 2015-03-19 DIAGNOSIS — M2040 Other hammer toe(s) (acquired), unspecified foot: Secondary | ICD-10-CM | POA: Diagnosis not present

## 2015-03-19 DIAGNOSIS — M779 Enthesopathy, unspecified: Secondary | ICD-10-CM | POA: Diagnosis not present

## 2015-03-27 ENCOUNTER — Encounter (HOSPITAL_COMMUNITY): Payer: Self-pay | Admitting: Emergency Medicine

## 2015-03-27 ENCOUNTER — Emergency Department (INDEPENDENT_AMBULATORY_CARE_PROVIDER_SITE_OTHER)
Admission: EM | Admit: 2015-03-27 | Discharge: 2015-03-27 | Disposition: A | Discharge status: Home / Self Care | Payer: BC Managed Care – PPO | Source: Home / Self Care | Attending: Family Medicine | Admitting: Family Medicine

## 2015-03-27 DIAGNOSIS — K0889 Other specified disorders of teeth and supporting structures: Secondary | ICD-10-CM

## 2015-03-27 MED ORDER — DICLOFENAC POTASSIUM 50 MG PO TABS: 50.0000 mg | ORAL_TABLET | Freq: Three times a day (TID) | ORAL | Status: DC

## 2015-03-27 MED ORDER — CLINDAMYCIN HCL 150 MG PO CAPS: 150.0000 mg | ORAL_CAPSULE | Freq: Three times a day (TID) | ORAL | Status: DC

## 2015-03-27 NOTE — ED Provider Notes (Signed)
CSN: [id number]     Arrival date & time 03/27/15  1354 History   First MD Initiated Contact with Patient 03/27/15 1604     Chief Complaint  Patient presents with  . Dental Pain   (Consider location/radiation/quality/duration/timing/severity/associated sxs/prior Treatment) Patient is a 29 y.o. male presenting with tooth pain. The history is provided by the patient.  Dental Pain Location:  Upper Upper teeth location:  4/RU 2nd bicuspid Quality:  Aching and throbbing Severity:  Mild Onset quality:  Sudden Duration:  3 weeks Progression:  Worsening Chronicity:  New Context: dental fracture   Relieved by:  None tried Worsened by:  Nothing tried Associated symptoms: no facial pain, no facial swelling and no fever   Risk factors: sufficient dental care     Past Medical History  Diagnosis Date  . Diabetes mellitus   . Hypertension    History reviewed. No pertinent past surgical history. Family History  Problem Relation Age of Onset  . Hypertension Other   . Diabetes Other    Social History  Substance Use Topics  . Smoking status: Never Smoker   . Smokeless tobacco: Never Used  . Alcohol Use: No    Review of Systems  Constitutional: Negative for fever.  HENT: Positive for dental problem. Negative for facial swelling and postnasal drip.   All other systems reviewed and are negative.   Allergies  Review of patient's allergies indicates no known allergies.  Home Medications   Prior to Admission medications   Medication Sig Start Date End Date Taking? Authorizing Provider  clindamycin (CLEOCIN) 150 MG capsule Take 1 capsule (150 mg total) by mouth 3 (three) times daily. 03/27/15   Linna HoffJames D Malaka Ruffner, MD  diclofenac (CATAFLAM) 50 MG tablet Take 1 tablet (50 mg total) by mouth 3 (three) times daily. For dental pain 03/27/15   Linna HoffJames D Posey Petrik, MD  diclofenac (VOLTAREN) 75 MG EC tablet Take 1 tablet (75 mg total) by mouth 2 (two) times daily. 03/18/15   Lenn SinkNorman S Regal, DPM    insulin glargine (LANTUS) 100 UNIT/ML injection Inject into the skin as needed.    Historical Provider, MD   Meds Ordered and Administered this Visit  Medications - No data to display  BP 150/70 mmHg  Pulse 91  Temp(Src) 98.9 F (37.2 C) (Oral)  SpO2 99% No data found.   Physical Exam  Constitutional: He is oriented to person, place, and time. He appears well-developed and well-nourished. No distress.  HENT:  Right Ear: External ear normal.  Left Ear: External ear normal.  Mouth/Throat: Oropharynx is clear and moist. No oral lesions. Abnormal dentition. No dental abscesses.    Eyes: Pupils are equal, round, and reactive to light.  Neck: Normal range of motion. Neck supple.  Lymphadenopathy:    He has no cervical adenopathy.  Neurological: He is alert and oriented to person, place, and time.  Skin: Skin is warm and dry.  Nursing note and vitals reviewed.   ED Course  Procedures (including critical care time)  Labs Review Labs Reviewed - No data to display  Imaging Review No results found.   Visual Acuity Review  Right Eye Distance:   Left Eye Distance:   Bilateral Distance:    Right Eye Near:   Left Eye Near:    Bilateral Near:         MDM   1. Pain, dental        Linna HoffJames D Ariaunna Longsworth, MD 03/27/15 [phone]

## 2015-03-27 NOTE — Discharge Instructions (Signed)
Take medicine as prescribed, see your dentist as soon as possible °

## 2015-03-27 NOTE — ED Notes (Signed)
toothache

## 2015-03-29 NOTE — Progress Notes (Signed)
Subjective:     Patient ID: [name], male   DOB: 1985-10-29, 29 y.o.   MRN: [id number]  HPI patient states I'm improving but I'm still having discomfort in my foot and I need to be active to keep my weight under control   Review of Systems     Objective:   Physical Exam Neurovascular status intact muscle strength adequate with discomfort underneath the sesamoidal complex left that is nondescript with no indications currently of fracture environment    Assessment:     Probable inflammatory sesamoiditis even though I still cannot rule out the possibility for a stress fracture fracture of sesamoid bone    Plan:     I explained this to him and we'll continue to try conservative care and he understands that ultimately it may require excision of sesamoid if symptoms were to persist and not respond conservatively. I did carefully injected around the sesamoidal complex 3 mg dexamethasone Kenalog 5 mg Xylocaine and then scanned for custom orthotic to reduce the pressure against the first MPJ and will return when those are ready or earlier if needed

## 2015-06-12 ENCOUNTER — Emergency Department (INDEPENDENT_AMBULATORY_CARE_PROVIDER_SITE_OTHER)
Admission: EM | Admit: 2015-06-12 | Discharge: 2015-06-12 | Disposition: A | Discharge status: Home / Self Care | Payer: BC Managed Care – PPO | Source: Home / Self Care | Attending: Emergency Medicine | Admitting: Emergency Medicine

## 2015-06-12 ENCOUNTER — Encounter (HOSPITAL_COMMUNITY): Payer: Self-pay | Admitting: *Deleted

## 2015-06-12 DIAGNOSIS — R220 Localized swelling, mass and lump, head: Secondary | ICD-10-CM | POA: Diagnosis not present

## 2015-06-12 DIAGNOSIS — K047 Periapical abscess without sinus: Secondary | ICD-10-CM | POA: Diagnosis not present

## 2015-06-12 MED ORDER — IBUPROFEN 800 MG PO TABS: 800.0000 mg | ORAL_TABLET | Freq: Three times a day (TID) | ORAL | Status: DC

## 2015-06-12 MED ORDER — TRAMADOL HCL 50 MG PO TABS: ORAL_TABLET | ORAL | Status: DC

## 2015-06-12 MED ORDER — CHLORHEXIDINE GLUCONATE 0.12 % MT SOLN: OROMUCOSAL | Status: DC

## 2015-06-12 MED ORDER — PENICILLIN V POTASSIUM 500 MG PO TABS: 500.0000 mg | ORAL_TABLET | Freq: Four times a day (QID) | ORAL | Status: DC

## 2015-06-12 NOTE — ED Provider Notes (Signed)
HPI  SUBJECTIVE:  [name] is a 30 y.o. male who presents with right upper painful facial swelling, sinus pain, starting earlier today. He reports having a right upper "broken tooth for a while" but states it started hurting today. He denies any trauma to his face. He thinks that perhaps maybe there is some food stuck in his broken tooth. No nausea, vomiting, fevers, trismus, nasal congestion. No aggravating or alleviating factors. He has not tried anything for this. Past medical history of diabetes, well-controlled. No history of hypertension. Patient is not a smoker.    Past Medical History  Diagnosis Date  . Diabetes mellitus   . Hypertension     History reviewed. No pertinent past surgical history.  Family History  Problem Relation Age of Onset  . Hypertension Other   . Diabetes Other     Social History  Substance Use Topics  . Smoking status: Never Smoker   . Smokeless tobacco: Never Used  . Alcohol Use: No    No current facility-administered medications for this encounter.  Current outpatient prescriptions:  .  chlorhexidine (PERIDEX) 0.12 % solution, 15 mL swish and spit bid, Disp: 480 mL, Rfl: 0 .  ibuprofen (ADVIL,MOTRIN) 800 MG tablet, Take 1 tablet (800 mg total) by mouth 3 (three) times daily., Disp: 30 tablet, Rfl: 0 .  insulin glargine (LANTUS) 100 UNIT/ML injection, Inject into the skin as needed., Disp: , Rfl:  .  penicillin v potassium (VEETID) 500 MG tablet, Take 1 tablet (500 mg total) by mouth 4 (four) times daily. X 10 days, Disp: 40 tablet, Rfl: 0 .  traMADol (ULTRAM) 50 MG tablet, 1-2 tabs po q 6 hr prn pain Maximum dose= 8 tablets per day, Disp: 20 tablet, Rfl: 0 .  [DISCONTINUED] insulin aspart (NOVOLOG) 100 UNIT/ML SOCT cartridge, Inject 5 Units into the skin 3 (three) times daily with meals. (Patient not taking: Reported on 10/30/2014), Disp: 30 mL, Rfl: 1  No Known Allergies   ROS  As noted in HPI.   Physical Exam  BP 145/106 mmHg   Pulse 117  Temp(Src) 98.6 F (37 C) (Oral)  Resp 16  SpO2 99%  Constitutional: Well developed, well nourished, no appears uncomfortable Eyes:  EOMI, conjunctiva normal bilaterally HENT: Normocephalic, atraumatic,mucus membranes moist. Tooth # number 4 second bicuspid broken, tender to palpation, which patient states is new. Positive gingival tenderness. No gingival swelling, expressible purulent drainage. Positive tender right upper facial swelling. No swelling inferior to the eye Nasolabial fold intact. No swelling, tenderness of parotid or submandibular glands. Lymph: No cervical lymphadenopathy.  Respiratory: Normal inspiratory effort Cardiovascular: Normal rate GI: nondistended skin: No rash, skin intact Musculoskeletal: no deformities Neurologic: Alert & oriented x 3, no focal neuro deficits Psychiatric: Speech and behavior appropriate   ED Course   Medications - No data to display  No orders of the defined types were placed in this encounter.    No results found for this or any previous visit (from the past 24 hour(s)). No results found.  ED Clinical Impression  Right facial swelling  Dental infection   ED Assessment/Plan  No evidence of a canine space abscess, sinusitis at this time. Presentation most consistent with facial swelling secondary to dental infection. Home with penicillin, Peridex, ibuprofen, tramadol, salt water gargles. Patient was given dental referral sheet. He is to check his glucose on a regular basis. Advised patient to follow-up in 2 or 3 days. Discussed  MDM, plan and followup with patient. Discussed sn/sx that  should prompt return to the  ED. Patient agrees with plan.  *This clinic note was created using Dragon dictation software. Therefore, there may be occasional mistakes despite careful proofreading.  ?    Domenick GongAshley Fordyce Lepak, MD 06/12/15 [phone]

## 2015-06-12 NOTE — Discharge Instructions (Signed)
Go to www.goodrx.com to look up your medications. This will give you a list of where you can find your prescriptions at the most affordable prices.  ° °RESOURCE GUIDE ° °Dental Problems ° °Look up http://www.ncdental.org/[email or web address].asp for a schedule of the [facility]'s free dental clinics called [facility]. They have clinics all around [place]. Get there early and be prepared to wait.  ° °[facility] °[address]  [place], [place] [address] °[phone] ° °[facility] °[address] °[place], [place] °[phone] ° °Patients with Medicaid: °[facility]                     [facility] °[address]                                [address] °Phone:  [phone]                                                  Phone:  [phone] ° °If unable to pay or uninsured, contact:  [facility] or [facility] to become qualified for the adult dental clinic. ° °

## 2015-06-12 NOTE — ED Notes (Signed)
Pt reports  r  Facial    Swelling      With     Pain       Has    Had    Problems  r  Upper  Tooth   In past

## 2015-08-24 ENCOUNTER — Ambulatory Visit (INDEPENDENT_AMBULATORY_CARE_PROVIDER_SITE_OTHER): Payer: BC Managed Care – PPO

## 2015-08-24 ENCOUNTER — Ambulatory Visit (HOSPITAL_COMMUNITY)
Admission: EM | Admit: 2015-08-24 | Discharge: 2015-08-24 | Disposition: A | Discharge status: Home / Self Care | Payer: BC Managed Care – PPO | Attending: Family Medicine | Admitting: Family Medicine

## 2015-08-24 ENCOUNTER — Encounter (HOSPITAL_COMMUNITY): Payer: Self-pay | Admitting: Nurse Practitioner

## 2015-08-24 DIAGNOSIS — S93402A Sprain of unspecified ligament of left ankle, initial encounter: Secondary | ICD-10-CM

## 2015-08-24 MED ORDER — NAPROXEN 500 MG PO TABS: 500.0000 mg | ORAL_TABLET | Freq: Two times a day (BID) | ORAL | Status: DC

## 2015-08-24 NOTE — Discharge Instructions (Signed)
Ankle Sprain °An ankle sprain is an injury to the strong, fibrous tissues (ligaments) that hold your ankle bones together.  °HOME CARE  °· Put ice on your ankle for 1-2 days or as told by your doctor. °¨ Put ice in a plastic bag. °¨ Place a towel between your skin and the bag. °¨ Leave the ice on for 15-20 minutes at a time, every 2 hours while you are awake. °· Only take medicine as told by your doctor. °· Raise (elevate) your injured ankle above the level of your heart as much as possible for 2-3 days. °· Use crutches if your doctor tells you to. Slowly put your own weight on the affected ankle. Use the crutches until you can walk without pain. °· If you have a plaster splint: °¨ Do not rest it on anything harder than a pillow for 24 hours. °¨ Do not put weight on it. °¨ Do not get it wet. °¨ Take it off to shower or bathe. °· If given, use an elastic wrap or support stocking for support. Take the wrap off if your toes lose feeling (numb), tingle, or turn cold or blue. °· If you have an air splint: °¨ Add or let out air to make it comfortable. °¨ Take it off at night and to shower and bathe. °¨ Wiggle your toes and move your ankle up and down often while you are wearing it. °GET HELP IF: °· You have rapidly increasing bruising or puffiness (swelling). °· Your toes feel very cold. °· You lose feeling in your foot. °· Your medicine does not help your pain. °GET HELP RIGHT AWAY IF:  °· Your toes lose feeling (numb) or turn blue. °· You have severe pain that is increasing. °MAKE SURE YOU:  °· Understand these instructions. °· Will watch your condition. °· Will get help right away if you are not doing well or get worse. °  °This information is not intended to replace advice given to you by your health care provider. Make sure you discuss any questions you have with your health care provider. °  °Document Released: 09/07/2007 Document Revised: 04/11/2014 Document Reviewed: 10/03/2011 °Elsevier Interactive Patient  Education ©2016 [employer] ° °

## 2015-08-24 NOTE — ED Notes (Signed)
Pt c/o 1 day history L foot pain. Onset after he rolled his foot yesterday. He has tried nothing at home for the pain. He has a history of L foot pain and cortisone shots in this foot.

## 2015-08-24 NOTE — ED Provider Notes (Signed)
CSN: [id number]     Arrival date & time 08/24/15  1307 History   First MD Initiated Contact with Patient 08/24/15 1405     Chief Complaint  Patient presents with  . Foot Pain   HPI Pt presents with foot pain following injury yesterday. Pt reports that he has a history of foot pain for which he has seen podiatry in the past but recently this has not bothered him. Yesterday he slipped walking in the rain and rolled his ankle in. Since then he has had a lot of pain in his medial foot around the arch that shoots down toward his big toe. He has also noticed some darkening of the skin in that area. The pain is worse with weight bearing and kept him up last night because it hurt if he put any pressure on the painful area. He denies fevers, chills, nausea/vomiting/diarrhea and otherwise feels well. He has not tried any treatments so far. The pain is similar to his prior foot pain but not exactly the same place.  Past Medical History  Diagnosis Date  . Diabetes mellitus   . Hypertension    History reviewed. No pertinent past surgical history. Family History  Problem Relation Age of Onset  . Hypertension Other   . Diabetes Other    Social History  Substance Use Topics  . Smoking status: Never Smoker   . Smokeless tobacco: Never Used  . Alcohol Use: No    Review of Systems See HPI  Allergies  Review of patient's allergies indicates no known allergies.  Home Medications   Prior to Admission medications   Medication Sig Start Date End Date Taking? Authorizing Provider  chlorhexidine (PERIDEX) 0.12 % solution 15 mL swish and spit bid 06/12/15   Domenick GongAshley Mortenson, MD  ibuprofen (ADVIL,MOTRIN) 800 MG tablet Take 1 tablet (800 mg total) by mouth 3 (three) times daily. 06/12/15   Domenick GongAshley Mortenson, MD  insulin glargine (LANTUS) 100 UNIT/ML injection Inject into the skin as needed.    Historical Provider, MD  naproxen (NAPROSYN) 500 MG tablet Take 1 tablet (500 mg total) by mouth 2 (two) times daily  with a meal. 08/24/15   Abram SanderElena M Shirrell Solinger, MD  penicillin v potassium (VEETID) 500 MG tablet Take 1 tablet (500 mg total) by mouth 4 (four) times daily. X 10 days 06/12/15   Domenick GongAshley Mortenson, MD  traMADol (ULTRAM) 50 MG tablet 1-2 tabs po q 6 hr prn pain Maximum dose= 8 tablets per day 06/12/15   Domenick GongAshley Mortenson, MD   Meds Ordered and Administered this Visit  Medications - No data to display  BP 136/90 mmHg  Pulse 94  Temp(Src) 98.5 F (36.9 C) (Oral)  Resp 12  SpO2 94% No data found.   Physical Exam  Constitutional: He is oriented to person, place, and time. He appears well-developed and well-nourished. No distress.  HENT:  Head: Normocephalic and atraumatic.  Right Ear: External ear normal.  Left Ear: External ear normal.  Nose: Nose normal.  Eyes: Conjunctivae are normal. Pupils are equal, round, and reactive to light. Right eye exhibits no discharge. Left eye exhibits no discharge. No scleral icterus.  Neck: Normal range of motion. Neck supple.  Cardiovascular: Normal rate, regular rhythm, normal heart sounds and intact distal pulses.   No murmur heard. Pulmonary/Chest: Effort normal and breath sounds normal. No respiratory distress. He has no wheezes.  Abdominal: Soft. Bowel sounds are normal. He exhibits no distension. There is no tenderness.  Musculoskeletal:  Left foot: There is tenderness, bony tenderness and swelling. There is normal range of motion, normal capillary refill, no crepitus, no deformity and no laceration.       Feet:  Neurological: He is alert and oriented to person, place, and time.  Skin: Skin is warm and dry. No rash noted. He is not diaphoretic.  Psychiatric: He has a normal mood and affect. His behavior is normal.  Nursing note and vitals reviewed.   ED Course  Procedures (including critical care time)  Labs Review Labs Reviewed - No data to display  Imaging Review Dg Foot Complete Left  08/24/2015  CLINICAL DATA:  Left foot pain after injury  yesterday. EXAM: LEFT FOOT - COMPLETE 3+ VIEW COMPARISON:  None. FINDINGS: Osseous alignment is normal. Bone mineralization is normal. No fracture line or displaced fracture fragment seen. Adjacent soft tissues are unremarkable. IMPRESSION: Negative. Electronically Signed   By: Bary Richard M.D.   On: 08/24/2015 14:59      MDM   1. Ankle sprain, left, initial encounter    Concern for navicular fracture or lisfranc injury given location of pain. Will obtain xray to eval for acute fractures.  Negative xray. Likely minor ligament injury. Recommend RICE, rx naproxen and refer back to Dr. Charlsie Merles for ongoing management if pain does not resolve.  Patient discussed with and examined by Dr. Artis Flock who agrees with assessment and plan.   Abram Sander, MD 08/24/15 1504  Abram Sander, MD 08/24/15 [phone]

## 2015-10-20 ENCOUNTER — Emergency Department (HOSPITAL_COMMUNITY): Payer: BC Managed Care – PPO

## 2015-10-20 ENCOUNTER — Encounter (HOSPITAL_COMMUNITY): Payer: Self-pay | Admitting: *Deleted

## 2015-10-20 ENCOUNTER — Emergency Department (HOSPITAL_COMMUNITY)
Admission: EM | Admit: 2015-10-20 | Discharge: 2015-10-20 | Disposition: A | Discharge status: Home / Self Care | Payer: BC Managed Care – PPO | Attending: Emergency Medicine | Admitting: Emergency Medicine

## 2015-10-20 DIAGNOSIS — Z794 Long term (current) use of insulin: Secondary | ICD-10-CM | POA: Insufficient documentation

## 2015-10-20 DIAGNOSIS — Y939 Activity, unspecified: Secondary | ICD-10-CM | POA: Insufficient documentation

## 2015-10-20 DIAGNOSIS — E119 Type 2 diabetes mellitus without complications: Secondary | ICD-10-CM | POA: Insufficient documentation

## 2015-10-20 DIAGNOSIS — I10 Essential (primary) hypertension: Secondary | ICD-10-CM | POA: Insufficient documentation

## 2015-10-20 DIAGNOSIS — Z79899 Other long term (current) drug therapy: Secondary | ICD-10-CM | POA: Insufficient documentation

## 2015-10-20 DIAGNOSIS — Y999 Unspecified external cause status: Secondary | ICD-10-CM | POA: Insufficient documentation

## 2015-10-20 DIAGNOSIS — Y92410 Unspecified street and highway as the place of occurrence of the external cause: Secondary | ICD-10-CM | POA: Insufficient documentation

## 2015-10-20 DIAGNOSIS — M542 Cervicalgia: Secondary | ICD-10-CM

## 2015-10-20 DIAGNOSIS — M549 Dorsalgia, unspecified: Secondary | ICD-10-CM | POA: Diagnosis not present

## 2015-10-20 MED ORDER — NAPROXEN 500 MG PO TABS: 500.0000 mg | ORAL_TABLET | Freq: Two times a day (BID) | ORAL | Status: DC

## 2015-10-20 MED ORDER — ACETAMINOPHEN 325 MG PO TABS
650.0000 mg | ORAL_TABLET | Freq: Once | ORAL | Status: AC
  Administered 2015-10-20: 650 mg via ORAL
  Filled 2015-10-20: qty 2
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
## 2016-01-31 ENCOUNTER — Encounter (HOSPITAL_COMMUNITY): Payer: Self-pay | Admitting: Emergency Medicine

## 2016-01-31 ENCOUNTER — Ambulatory Visit (HOSPITAL_COMMUNITY)
Admission: EM | Admit: 2016-01-31 | Discharge: 2016-01-31 | Disposition: A | Discharge status: Nursing Home (Includes ICF) | Payer: BC Managed Care – PPO

## 2016-01-31 ENCOUNTER — Emergency Department (HOSPITAL_COMMUNITY)
Admission: EM | Admit: 2016-01-31 | Discharge: 2016-01-31 | Disposition: A | Discharge status: Home / Self Care | Payer: BC Managed Care – PPO | Attending: Emergency Medicine | Admitting: Emergency Medicine

## 2016-01-31 DIAGNOSIS — R112 Nausea with vomiting, unspecified: Secondary | ICD-10-CM | POA: Diagnosis present

## 2016-01-31 DIAGNOSIS — Z794 Long term (current) use of insulin: Secondary | ICD-10-CM | POA: Diagnosis not present

## 2016-01-31 DIAGNOSIS — R197 Diarrhea, unspecified: Secondary | ICD-10-CM | POA: Diagnosis not present

## 2016-01-31 DIAGNOSIS — Z79899 Other long term (current) drug therapy: Secondary | ICD-10-CM | POA: Insufficient documentation

## 2016-01-31 DIAGNOSIS — E119 Type 2 diabetes mellitus without complications: Secondary | ICD-10-CM | POA: Insufficient documentation

## 2016-01-31 LAB — COMPREHENSIVE METABOLIC PANEL
ALT: 14 U/L — ABNORMAL LOW (ref 17–63)
AST: 15 U/L (ref 15–41)
Albumin: 4.2 g/dL (ref 3.5–5.0)
Alkaline Phosphatase: 103 U/L (ref 38–126)
Anion gap: 9 (ref 5–15)
BUN: 18 mg/dL (ref 6–20)
CO2: 26 mmol/L (ref 22–32)
Calcium: 9.6 mg/dL (ref 8.9–10.3)
Chloride: 97 mmol/L — ABNORMAL LOW (ref 101–111)
Creatinine, Ser: 0.91 mg/dL (ref 0.61–1.24)
GFR calc Af Amer: >60 mL/min (ref >60)
GFR calc non Af Amer: >60 mL/min (ref >60)
Glucose, Bld: 361 mg/dL — ABNORMAL HIGH (ref 65–99)
Potassium: 3.7 mmol/L (ref 3.5–5.1)
Sodium: 132 mmol/L — ABNORMAL LOW (ref 135–145)
Total Bilirubin: 0.5 mg/dL (ref 0.3–1.2)
Total Protein: 7.8 g/dL (ref 6.5–8.1)

## 2016-01-31 LAB — CBC
HCT: 49.2 % (ref 39.0–52.0)
Hemoglobin: 17.3 g/dL — ABNORMAL HIGH (ref 13.0–17.0)
MCH: 28.3 pg (ref 26.0–34.0)
MCHC: 35.2 g/dL (ref 30.0–36.0)
MCV: 80.4 fL (ref 78.0–100.0)
Platelets: 280 10*3/uL (ref 150–400)
RBC: 6.12 MIL/uL — ABNORMAL HIGH (ref 4.22–5.81)
RDW: 11.8 % (ref 11.5–15.5)
WBC: 8.2 10*3/uL (ref 4.0–10.5)

## 2016-01-31 LAB — URINE MICROSCOPIC-ADD ON
Bacteria, UA: NONE SEEN
RBC / HPF: NONE SEEN RBC/hpf (ref 0–5)
Squamous Epithelial / LPF: NONE SEEN

## 2016-01-31 LAB — URINALYSIS, ROUTINE W REFLEX MICROSCOPIC
Bilirubin Urine: NEGATIVE
Glucose, UA: >1000 mg/dL — AB
Hgb urine dipstick: NEGATIVE
Ketones, ur: NEGATIVE mg/dL
Leukocytes, UA: NEGATIVE
Nitrite: NEGATIVE
Protein, ur: 100 mg/dL — AB
Specific Gravity, Urine: 1.034 — ABNORMAL HIGH (ref 1.005–1.030)
pH: 6.5 (ref 5.0–8.0)

## 2016-01-31 LAB — LIPASE, BLOOD: Lipase: 19 U/L (ref 11–51)

## 2016-01-31 LAB — I-STAT CG4 LACTIC ACID, ED: Lactic Acid, Venous: 1.97 mmol/L (ref 0.5–1.9)

## 2016-01-31 LAB — CBG MONITORING, ED: Glucose-Capillary: 362 mg/dL — ABNORMAL HIGH (ref 65–99)

## 2016-01-31 MED ORDER — ONDANSETRON HCL 4 MG PO TABS: 4.0000 mg | ORAL_TABLET | Freq: Four times a day (QID) | ORAL | 0 refills | Status: DC

## 2016-01-31 MED ORDER — SODIUM CHLORIDE 0.9 % IV BOLUS (SEPSIS)
1000.0000 mL | Freq: Once | INTRAVENOUS | Status: AC
  Administered 2016-01-31: 1000 mL via INTRAVENOUS

## 2016-01-31 MED ORDER — ONDANSETRON HCL 4 MG/2ML IJ SOLN
4.0000 mg | Freq: Once | INTRAMUSCULAR | Status: AC
  Administered 2016-01-31: 4 mg via INTRAVENOUS
  Filled 2016-01-31: qty 2

## 2016-01-31 NOTE — ED Provider Notes (Signed)
[facility] Provider Note   CSN: [id number] Arrival date & time: 01/31/16  1948     History   Chief Complaint Chief Complaint  Patient presents with  . Emesis    HPI [name] is a 30 y.o. male.  Patient presents with three days of intermittent emesis and diarrhea. Reports it started Friday night after he ate a sandwich. Has had difficulty keeping PO down since then. Had 10-12 episodes diarrhea the first day which has now ceased. No fevers, no abdominal pain. Is a diabetic and reports he takes 40U long-acting insulin daily "when he needs it". Did not check his sugar or take his insulin today.   The history is provided by the patient. No language interpreter was used.  Emesis   This is a new problem. The current episode started more than 2 days ago (three days ago). The problem occurs 5 to 10 times per day. The problem has not changed since onset.The emesis has an appearance of stomach contents. There has been no fever. Associated symptoms include diarrhea. Pertinent negatives include no abdominal pain, no chills and no fever. Risk factors include suspect food intake.    Past Medical History:  Diagnosis Date  . Diabetes mellitus     Patient Active Problem List   Diagnosis Date Noted  . 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 Hyperglycemia without ketosis 01/19/2013    No past surgical history on file.     Home Medications    Prior to Admission medications   Medication Sig Start Date End Date Taking? Authorizing Provider  chlorhexidine (PERIDEX) 0.12 % solution 15 mL swish and spit bid 06/12/15   Domenick Gong, MD  insulin glargine (LANTUS) 100 UNIT/ML injection Inject into the skin as needed.    Historical Provider, MD  naproxen (NAPROSYN) 500 MG tablet Take 1 tablet (500 mg total) by mouth 2 (two) times daily with a meal. 10/20/15   Everlene Farrier, PA-C  ondansetron (ZOFRAN) 4 MG tablet Take 1 tablet (4 mg total) by mouth  every 6 (six) hours. 01/31/16   Preston Fleeting, MD  penicillin v potassium (VEETID) 500 MG tablet Take 1 tablet (500 mg total) by mouth 4 (four) times daily. X 10 days 06/12/15   Domenick Gong, MD  traMADol Janean Sark) 50 MG tablet 1-2 tabs po q 6 hr prn pain Maximum dose= 8 tablets per day 06/12/15   Domenick Gong, MD    Family History Family History  Problem Relation Age of Onset  . Hypertension Other   . Diabetes Other     Social History Social History  Substance Use Topics  . Smoking status: Never Smoker  . Smokeless tobacco: Never Used  . Alcohol use No     Allergies   Metformin and related   Review of Systems Review of Systems  Constitutional: Negative for chills and fever.  HENT: Negative.   Respiratory: Negative for shortness of breath.   Cardiovascular: Negative for chest pain.  Gastrointestinal: Positive for diarrhea and vomiting. Negative for abdominal pain.  Genitourinary: Negative.   Musculoskeletal: Negative.   Skin: Negative.   Allergic/Immunologic: Negative for immunocompromised state.  Neurological: Negative.   Hematological: Does not bruise/bleed easily.  Psychiatric/Behavioral: Negative.      Physical Exam Updated Vital Signs BP 123/76   Pulse 94   Temp 98.2 F (36.8 C) (Oral)   Resp 21   SpO2 100%   Physical Exam  Constitutional: He is oriented to person, place, and time.  He appears well-developed and well-nourished. No distress.  HENT:  Head: Normocephalic and atraumatic.  Eyes: Conjunctivae and EOM are normal. Pupils are equal, round, and reactive to light.  Neck: Normal range of motion. Neck supple.  Cardiovascular: Regular rhythm, normal heart sounds and intact distal pulses.  Tachycardia present.  Exam reveals no gallop and no friction rub.   No murmur heard. Pulmonary/Chest: Effort normal and breath sounds normal. No respiratory distress. He has no wheezes. He has no rales.  Abdominal: Soft. He exhibits no distension. There is no  tenderness. There is no rebound and no guarding.  Neurological: He is alert and oriented to person, place, and time.  Skin: Skin is warm and dry. He is not diaphoretic.  Psychiatric: He has a normal mood and affect. His behavior is normal. Judgment and thought content normal.     ED Treatments / Results  Labs (all labs ordered are listed, but only abnormal results are displayed) Labs Reviewed  COMPREHENSIVE METABOLIC PANEL - Abnormal; Notable for the following:       Result Value   Sodium 132 (*)    Chloride 97 (*)    Glucose, Bld 361 (*)    ALT 14 (*)    All other components within normal limits  CBC - Abnormal; Notable for the following:    RBC 6.12 (*)    Hemoglobin 17.3 (*)    All other components within normal limits  URINALYSIS, ROUTINE W REFLEX MICROSCOPIC (NOT AT [facility]) - Abnormal; Notable for the following:    Specific Gravity, Urine 1.034 (*)    Glucose, UA >1000 (*)    Protein, ur 100 (*)    All other components within normal limits  URINE MICROSCOPIC-ADD ON - Abnormal; Notable for the following:    Casts HYALINE CASTS (*)    All other components within normal limits  I-STAT CG4 LACTIC ACID, ED - Abnormal; Notable for the following:    Lactic Acid, Venous 1.97 (*)    All other components within normal limits  CBG MONITORING, ED - Abnormal; Notable for the following:    Glucose-Capillary 362 (*)    All other components within normal limits  CULTURE, BLOOD (ROUTINE X 2)  CULTURE, BLOOD (ROUTINE X 2)  LIPASE, BLOOD    EKG  EKG Interpretation None       Radiology No results found.  Procedures Procedures (including critical care time)  Medications Ordered in ED Medications  sodium chloride 0.9 % bolus 1,000 mL (0 mLs Intravenous Stopped 01/31/16 2151)  ondansetron (ZOFRAN) injection 4 mg (4 mg Intravenous Given 01/31/16 2032)  sodium chloride 0.9 % bolus 1,000 mL (0 mLs Intravenous Stopped 01/31/16 2329)     Initial Impression / Assessment and Plan /  ED Course  I have reviewed the triage vital signs and the nursing notes.  Pertinent labs & imaging results that were available during my care of the patient were reviewed by me and considered in my medical decision making (see chart for details).  Clinical Course    Patient presents with three days of vomiting and diarrhea, sent by urgent care due to tachycardia. He is diabetic and has not checked his sugar or taken his insulin today because he hasn't been eating. Normally takes 40U at night but only "when he needs it". He is overall well-appearing and tachycardic but normotensive. He has no abdominal tenderness on examination. Do not suspect appendicitis, cholecystitis, pancreatitis, diverticulitis, or obstruction based on benign abdominal exam. Labs reveal hyperglycemia to the  mid 300s but no signs of DKA. Do not suspect HHS as he has normal mental status. Tachycardia improved after 2L of fluid resuscitation. He was able to tolerate PO after getting zofran. Advised patient to take his insulin tonight and recheck his blood glucose in the morning. He was given a prescription for zofran for symptom control. He is in good condition for discharge home.  Final Clinical Impressions(s) / ED Diagnoses   Final diagnoses:  Nausea vomiting and diarrhea    New Prescriptions Discharge Medication List as of 01/31/2016 10:45 PM    START taking these medications   Details  ondansetron (ZOFRAN) 4 MG tablet Take 1 tablet (4 mg total) by mouth every 6 (six) hours., Starting Sun 01/31/2016, Print         Preston FleetingAnna Kynedi Profitt, MD 01/31/16 [id number]    Nelva Nayobert Beaton, MD 02/13/16 2204

## 2016-01-31 NOTE — ED Notes (Signed)
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 Pt has had NVD since Friday. Has not taken lantus since Saturday. CBG 362. Pt denies pain. [facility] and sent to ED for further evaluation

## 2016-01-31 NOTE — ED Triage Notes (Addendum)
Pt presents from urgent care after going there this evening to be evaluated for nausea/emesis since Friday.  Was advised to come to the ED due to his high heart rate.  Currently only nauseated upon standing.  No complaints of pain.

## 2016-01-31 NOTE — ED Notes (Signed)
Pt tolerating water, given crackers per request

## 2016-01-31 NOTE — ED Triage Notes (Signed)
[facility] with a complaint of N/V/D that started 4 days ago. The patient stated that he has not been able to keep down solid foods and has just today been able to keep down clear liquids.

## 2016-01-31 NOTE — ED Notes (Signed)
Patient was evaluated in triage by Rosalie GumsF. Patrick, PA and advised to go to the [facility] ED for additional evaluation and treatment. [facility]

## 2016-02-05 LAB — CULTURE, BLOOD (ROUTINE X 2)
Culture: NO GROWTH
Culture: NO GROWTH

## 2016-04-13 ENCOUNTER — Emergency Department (HOSPITAL_COMMUNITY)
Admission: EM | Admit: 2016-04-13 | Discharge: 2016-04-14 | Disposition: A | Discharge status: Home / Self Care | Payer: BC Managed Care – PPO | Attending: Emergency Medicine | Admitting: Emergency Medicine

## 2016-04-13 ENCOUNTER — Ambulatory Visit (HOSPITAL_COMMUNITY)
Admission: EM | Admit: 2016-04-13 | Discharge: 2016-04-13 | Disposition: A | Discharge status: Nursing Home (Includes ICF) | Payer: BC Managed Care – PPO | Attending: Family Medicine | Admitting: Family Medicine

## 2016-04-13 ENCOUNTER — Encounter (HOSPITAL_COMMUNITY): Payer: Self-pay | Admitting: Emergency Medicine

## 2016-04-13 ENCOUNTER — Encounter (HOSPITAL_COMMUNITY): Payer: Self-pay

## 2016-04-13 DIAGNOSIS — E119 Type 2 diabetes mellitus without complications: Secondary | ICD-10-CM | POA: Insufficient documentation

## 2016-04-13 DIAGNOSIS — Z794 Long term (current) use of insulin: Secondary | ICD-10-CM | POA: Diagnosis not present

## 2016-04-13 DIAGNOSIS — M7989 Other specified soft tissue disorders: Secondary | ICD-10-CM

## 2016-04-13 DIAGNOSIS — M79672 Pain in left foot: Secondary | ICD-10-CM | POA: Insufficient documentation

## 2016-04-13 LAB — CBC
HCT: 43 % (ref 39.0–52.0)
Hemoglobin: 14.1 g/dL (ref 13.0–17.0)
MCH: 27.4 pg (ref 26.0–34.0)
MCHC: 32.8 g/dL (ref 30.0–36.0)
MCV: 83.5 fL (ref 78.0–100.0)
Platelets: 252 10*3/uL (ref 150–400)
RBC: 5.15 MIL/uL (ref 4.22–5.81)
RDW: 12.6 % (ref 11.5–15.5)
WBC: 5.7 10*3/uL (ref 4.0–10.5)

## 2016-04-13 LAB — BASIC METABOLIC PANEL
Anion gap: 10 (ref 5–15)
BUN: 6 mg/dL (ref 6–20)
CO2: 27 mmol/L (ref 22–32)
Calcium: 9.7 mg/dL (ref 8.9–10.3)
Chloride: 97 mmol/L — ABNORMAL LOW (ref 101–111)
Creatinine, Ser: 0.66 mg/dL (ref 0.61–1.24)
GFR calc Af Amer: >60 mL/min (ref >60)
GFR calc non Af Amer: >60 mL/min (ref >60)
Glucose, Bld: 307 mg/dL — ABNORMAL HIGH (ref 65–99)
Potassium: 4.3 mmol/L (ref 3.5–5.1)
Sodium: 134 mmol/L — ABNORMAL LOW (ref 135–145)

## 2016-04-13 NOTE — Discharge Instructions (Signed)
Go to ER for leg eval

## 2016-04-13 NOTE — ED Provider Notes (Signed)
[facility]    CSN: [id number] Arrival date & time: 04/13/16  1655     History   Chief Complaint Chief Complaint  Patient presents with  . Leg Swelling    HPI [name] is a 31 y.o. male.   The history is provided by the patient.  Leg Pain  Location:  Leg Time since incident:  3 days Injury: no   Leg location:  L lower leg Pain details:    Quality:  Throbbing   Radiates to:  Does not radiate   Severity:  Mild   Onset quality:  Gradual   Duration:  3 days   Progression:  Worsening Chronicity:  New Dislocation: no   Relieved by:  None tried Worsened by:  Nothing Ineffective treatments:  None tried Associated symptoms: swelling   Risk factors comment:  Diabetic  uses lantus   Past Medical History:  Diagnosis Date  . Diabetes mellitus     Patient Active Problem List   Diagnosis Date Noted  . 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 Hyperglycemia without ketosis 01/19/2013    History reviewed. No pertinent surgical history.     Home Medications    Prior to Admission medications   Medication Sig Start Date End Date Taking? Authorizing Provider  insulin glargine (LANTUS) 100 UNIT/ML injection Inject into the skin as needed.   Yes Historical Provider, MD    Family History Family History  Problem Relation Age of Onset  . Hypertension Other   . Diabetes Other     Social History Social History  Substance Use Topics  . Smoking status: Never Smoker  . Smokeless tobacco: Never Used  . Alcohol use No     Allergies   Metformin and related   Review of Systems Review of Systems  Constitutional: Negative.   Respiratory: Negative.   Gastrointestinal: Negative.   Genitourinary: Negative.   Musculoskeletal: Negative.   Skin: Negative.   All other systems reviewed and are negative.    Physical Exam Triage Vital Signs ED Triage Vitals [04/13/16 1838]  Enc Vitals Group     BP 142/70     Pulse Rate  92     Resp 18     Temp 98.4 F (36.9 C)     Temp Source Oral     SpO2 100 %     Weight      Height      Head Circumference      Peak Flow      Pain Score 0     Pain Loc      Pain Edu?      Excl. in GC?    No data found.   Updated Vital Signs BP 142/70 (BP Location: Left Arm)   Pulse 92   Temp 98.4 F (36.9 C) (Oral)   Resp 18   SpO2 100%   Visual Acuity Right Eye Distance:   Left Eye Distance:   Bilateral Distance:    Right Eye Near:   Left Eye Near:    Bilateral Near:     Physical Exam  Constitutional: He is oriented to person, place, and time. He appears well-developed and well-nourished.  Abdominal: Soft. Bowel sounds are normal.  Musculoskeletal: Normal range of motion. He exhibits edema. He exhibits no tenderness.  Neurological: He is alert and oriented to person, place, and time.  Nursing note and vitals reviewed.    UC Treatments / Results  Labs (all labs ordered are listed,  but only abnormal results are displayed) Labs Reviewed - No data to display  EKG  EKG Interpretation None       Radiology No results found.  Procedures Procedures (including critical care time)  Medications Ordered in UC Medications - No data to display   Initial Impression / Assessment and Plan / UC Course  I have reviewed the triage vital signs and the nursing notes.  Pertinent labs & imaging results that were available during my care of the patient were reviewed by me and considered in my medical decision making (see chart for details).  Clinical Course     Sent for eval of possible left leg dvt.  Final Clinical Impressions(s) / UC Diagnoses   Final diagnoses:  Leg swelling    New Prescriptions New Prescriptions   No medications on file     Linna Hoff, MD 04/13/16 1916

## 2016-04-13 NOTE — ED Triage Notes (Signed)
Pt states that for the past several days his L leg has been swollen from his knee down. No significant swelling, redness or warmth noted. Pt does have +1 pitting edema around the L ankle. Pt denies pain or fevers.

## 2016-04-13 NOTE — ED Triage Notes (Signed)
[facility] a [facility] of left leg swelling x 2 days. The patient presented with a swollen and red and warm to the touch lower left leg.

## 2016-04-13 NOTE — ED Notes (Signed)
Conan BowensA. Bowles, RN advised of patient acuity.

## 2016-04-14 ENCOUNTER — Encounter (HOSPITAL_COMMUNITY): Payer: Self-pay | Admitting: Emergency Medicine

## 2016-04-14 ENCOUNTER — Encounter (HOSPITAL_COMMUNITY): Payer: BC Managed Care – PPO

## 2016-04-14 ENCOUNTER — Emergency Department (HOSPITAL_COMMUNITY): Payer: BC Managed Care – PPO

## 2016-04-14 ENCOUNTER — Ambulatory Visit (HOSPITAL_BASED_OUTPATIENT_CLINIC_OR_DEPARTMENT_OTHER)
Admission: RE | Admit: 2016-04-14 | Discharge: 2016-04-14 | Disposition: A | Discharge status: Home / Self Care | Payer: BC Managed Care – PPO | Source: Ambulatory Visit | Attending: Emergency Medicine | Admitting: Emergency Medicine

## 2016-04-14 DIAGNOSIS — M79609 Pain in unspecified limb: Secondary | ICD-10-CM | POA: Diagnosis not present

## 2016-04-14 DIAGNOSIS — M7989 Other specified soft tissue disorders: Secondary | ICD-10-CM | POA: Diagnosis not present

## 2016-04-14 MED ORDER — IBUPROFEN 400 MG PO TABS
600.0000 mg | ORAL_TABLET | Freq: Once | ORAL | Status: AC
  Administered 2016-04-14: 600 mg via ORAL
  Filled 2016-04-14: qty 1

## 2016-04-14 MED ORDER — HYDROCODONE-ACETAMINOPHEN 5-325 MG PO TABS: 1.0000 | ORAL_TABLET | ORAL | 0 refills | Status: DC | PRN

## 2016-04-14 MED ORDER — IBUPROFEN 400 MG PO TABS: 400.0000 mg | ORAL_TABLET | Freq: Three times a day (TID) | ORAL | 0 refills | Status: DC | PRN

## 2016-04-14 MED ORDER — OXYCODONE-ACETAMINOPHEN 5-325 MG PO TABS
2.0000 | ORAL_TABLET | Freq: Once | ORAL | Status: AC
  Administered 2016-04-14: 2 via ORAL
  Filled 2016-04-14: qty 2

## 2016-04-14 NOTE — Progress Notes (Signed)
Preliminary results by tech - Venous Duplex Left Lower Ext. Completed. Negative for deep and superficial vein thrombosis in the left leg.  Marilynne Halstedita Kumar Falwell, BS, RDMS, RVT

## 2016-04-14 NOTE — ED Provider Notes (Signed)
[facility] Provider Note   CSN: [id number] Arrival date & time: 04/13/16  1936   By signing my name below, I, Clovis PuAvnee Patel, attest that this documentation has been prepared under the direction and in the presence of Azalia BilisKevin Clarabelle Oscarson, MD  Electronically Signed: Clovis PuAvnee Patel, ED Scribe. 04/14/16. 12:19 AM.   History   Chief Complaint Chief Complaint  Patient presents with  . Leg Swelling   The history is provided by the patient. No language interpreter was used.   HPI Comments:  [name] is a [age] y.o. male, with a hx of DM, who presents to the Emergency Department complaining of sudden onset, worsening left leg swelling, from the left knee down, x several days. Pt reports sudden onset left knee pain and a throbbing pain to his left foot x yesterday. His foot pain is worse upon palpation. No alleviating factors noted. Pt denies any recent falls/trauma/injury, any recent travel, left ankle pain, left heel pain and left calf pain. No other associated symptoms noted.   Past Medical History:  Diagnosis Date  . Diabetes mellitus     Patient Active Problem List   Diagnosis Date Noted  . 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 Hyperglycemia without ketosis 01/19/2013    History reviewed. No pertinent surgical history.   Home Medications    Prior to Admission medications   Medication Sig Start Date End Date Taking? Authorizing Provider  insulin glargine (LANTUS) 100 UNIT/ML injection Inject into the skin as needed.    Historical Provider, MD    Family History Family History  Problem Relation Age of Onset  . Hypertension Other   . Diabetes Other     Social History Social History  Substance Use Topics  . Smoking status: Never Smoker  . Smokeless tobacco: Never Used  . Alcohol use No     Allergies   Metformin and related   Review of Systems Review of Systems 10 systems reviewed and all are negative for acute change except as  noted in the HPI.  Physical Exam Updated Vital Signs BP 138/76 (BP Location: Left Arm)   Pulse 94   Temp 98.3 F (36.8 C) (Oral)   Resp 18   Ht 6\' 5"  (1.956 m)   Wt 240 lb (108.9 kg)   SpO2 96%   BMI 28.46 kg/m   Physical Exam  Constitutional: He is oriented to person, place, and time. He appears well-developed and well-nourished.  HENT:  Head: Normocephalic.  Eyes: EOM are normal.  Neck: Normal range of motion.  Pulmonary/Chest: Effort normal.  Abdominal: He exhibits no distension.  Musculoskeletal: Normal range of motion. He exhibits edema and tenderness.  Tenderness of the left 1st metatarsal with some associated swelling and warmth of the left foot. Normal PT and DP pulse in the left. Swelling noted around the left ankle. Painless ROM of the left ankle. No tenderness of the left knee. FROM of the left knee, Normal patellar tendon function at the left knee.   Neurological: He is alert and oriented to person, place, and time.  Psychiatric: He has a normal mood and affect.  Nursing note and vitals reviewed.  ED Treatments / Results  DIAGNOSTIC STUDIES:  Oxygen Saturation is 96% on RA, normal by my interpretation.    COORDINATION OF CARE:  12:17 AM Discussed treatment plan with pt at bedside and pt agreed to plan.  Labs (all labs ordered are listed, but only abnormal results are displayed) Labs Reviewed  BASIC  METABOLIC PANEL - Abnormal; Notable for the following:       Result Value   Sodium 134 (*)    Chloride 97 (*)    Glucose, Bld 307 (*)    All other components within normal limits  CBC    EKG  EKG Interpretation None       Radiology Dg Foot Complete Left  Result Date: 04/14/2016 CLINICAL DATA:  Sudden onset, worsening left leg swelling from the left knee down for several days. Sudden onset left knee pain and pain to the left foot since yesterday. No trauma. History of diabetes. EXAM: LEFT FOOT - COMPLETE 3+ VIEW COMPARISON:  08/24/2015 FINDINGS: Mild  hallux valgus deformity. Old cortical thickening along the midshaft fourth metatarsal bone. No evidence of acute fracture or dislocation. No focal bone lesion or bone destruction otherwise identified. Soft tissues are unremarkable. No radiopaque soft tissue foreign bodies. IMPRESSION: No acute bony abnormalities. Electronically Signed   By: Burman Nieves M.D.   On: 04/14/2016 00:47    Procedures Procedures (including critical care time)  Medications Ordered in ED Medications  oxyCODONE-acetaminophen (PERCOCET/ROXICET) 5-325 MG per tablet 2 tablet (2 tablets Oral Given 04/14/16 0050)  ibuprofen (ADVIL,MOTRIN) tablet 600 mg (600 mg Oral Given 04/14/16 0050)     Initial Impression / Assessment and Plan / ED Course  I have reviewed the triage vital signs and the nursing notes.  Pertinent labs & imaging results that were available during my care of the patient were reviewed by me and considered in my medical decision making (see chart for details).  Clinical Course     2:31 AM Patient feels much better at this time.  I don't believe this is cellulitis.  This seems to be more associated left foot pain.  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  [facility] year for similar symptoms and was treated with what sounds like an orthotic for his shoe.  X-ray without acute pathology.  Patient will be scheduled for a venous duplex of his left lower extremity for later today.  My suspicion is low and I do not think he needs a course of Lovenox at this time.  He understands return to the ER for new or worsening symptoms.  No fevers.  Final Clinical Impressions(s) / ED Diagnoses   Final diagnoses:  Left foot pain  Swelling of left lower extremity    New Prescriptions New Prescriptions   HYDROCODONE-ACETAMINOPHEN (NORCO/VICODIN) 5-325 MG TABLET    Take 1 tablet by mouth every 4 (four) hours as needed for moderate pain.   IBUPROFEN  (ADVIL,MOTRIN) 400 MG TABLET    Take 1 tablet (400 mg total) by mouth every 8 (eight) hours as needed.   I personally performed the services described in this documentation, which was scribed in my presence. The recorded information has been reviewed and is accurate.        Azalia Bilis, MD 04/14/16 [phone]

## 2017-04-06 ENCOUNTER — Encounter (HOSPITAL_COMMUNITY): Payer: Self-pay | Admitting: Family Medicine

## 2017-04-06 ENCOUNTER — Ambulatory Visit (HOSPITAL_COMMUNITY)
Admission: EM | Admit: 2017-04-06 | Discharge: 2017-04-06 | Disposition: A | Discharge status: Home / Self Care | Payer: BC Managed Care – PPO | Attending: Family Medicine | Admitting: Family Medicine

## 2017-04-06 DIAGNOSIS — B349 Viral infection, unspecified: Secondary | ICD-10-CM | POA: Diagnosis not present

## 2017-04-06 MED ORDER — IPRATROPIUM BROMIDE 0.06 % NA SOLN: 2.0000 | Freq: Four times a day (QID) | NASAL | 0 refills | Status: DC

## 2017-04-06 MED ORDER — BENZONATATE 100 MG PO CAPS: 100.0000 mg | ORAL_CAPSULE | Freq: Three times a day (TID) | ORAL | 0 refills | Status: DC

## 2017-04-06 MED ORDER — FLUTICASONE PROPIONATE 50 MCG/ACT NA SUSP: 2.0000 | Freq: Every day | NASAL | 0 refills | Status: DC

## 2017-04-06 MED ORDER — CETIRIZINE HCL 10 MG PO TABS: 10.0000 mg | ORAL_TABLET | Freq: Every day | ORAL | 0 refills | Status: DC

## 2017-04-06 NOTE — Discharge Instructions (Signed)
Tessalon for cough. Start flonase, atrovent, zyrtec for nasal congestion. You can use over the counter nasal saline rinse such as neti pot for nasal congestion. Keep hydrated, your urine should be clear to pale yellow in color. Tylenol/motrin for fever and pain. Monitor for any worsening of symptoms, chest pain, shortness of breath, wheezing, swelling of the throat, follow up for reevaluation.   For sore throat try using a honey-based tea. Use 3 teaspoons of honey with juice squeezed from half lemon. Place shaved pieces of ginger into 1/2-1 cup of water and warm over stove top. Then mix the ingredients and repeat every 4 hours as needed.

## 2017-04-06 NOTE — ED Provider Notes (Signed)
[facility]    CSN: [id number] Arrival date & time: 04/06/17  1354     History   Chief Complaint Chief Complaint  Patient presents with  . URI    HPI [name] is a 32 y.o. male.   32 year old male comes in for 2 day history of URI symptoms. Has had productive cough, rhinorrhea, nasal congestion. Denies sore throat, ear pain. Denies fever, chills, night sweats. Denies chest pain, shortness of breath, wheezing. otc cold medications (mucinex, dayquil) with some relief. Negative sick contact. Never smoker.      Past Medical History:  Diagnosis Date  . Diabetes mellitus     Patient Active Problem List   Diagnosis Date Noted  . 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 Hyperglycemia without ketosis 01/19/2013    History reviewed. No pertinent surgical history.     Home Medications    Prior to Admission medications   Medication Sig Start Date End Date Taking? Authorizing Provider  benzonatate (TESSALON) 100 MG capsule Take 1 capsule (100 mg total) by mouth every 8 (eight) hours. 04/06/17   Cathie HoopsYu, Parvin Stetzer V, PA-C  cetirizine (ZYRTEC) 10 MG tablet Take 1 tablet (10 mg total) by mouth daily. 04/06/17   Cathie HoopsYu, Janise Gora V, PA-C  fluticasone (FLONASE) 50 MCG/ACT nasal spray Place 2 sprays into both nostrils daily. 04/06/17   Cathie HoopsYu, Dequavius Kuhner V, PA-C  HYDROcodone-acetaminophen (NORCO/VICODIN) 5-325 MG tablet Take 1 tablet by mouth every 4 (four) hours as needed for moderate pain. 04/14/16   Azalia Bilisampos, Kevin, MD  ibuprofen (ADVIL,MOTRIN) 400 MG tablet Take 1 tablet (400 mg total) by mouth every 8 (eight) hours as needed. 04/14/16   Azalia Bilisampos, Kevin, MD  insulin glargine (LANTUS) 100 UNIT/ML injection Inject into the skin as needed.    [provider]  ipratropium (ATROVENT) 0.06 % nasal spray Place 2 sprays into both nostrils 4 (four) times daily. 04/06/17   Belinda FisherYu, Lennon Boutwell V, PA-C    Family History Family History  Problem Relation Age of Onset  . Hypertension Other   .  Diabetes Other     Social History Social History   Tobacco Use  . Smoking status: Never Smoker  . Smokeless tobacco: Never Used  Substance Use Topics  . Alcohol use: No  . Drug use: No     Allergies   Metformin and related   Review of Systems Review of Systems  Reason unable to perform ROS: See HPI as above.     Physical Exam Triage Vital Signs ED Triage Vitals [04/06/17 1437]  Enc Vitals Group     BP 125/73     Pulse Rate (!) 107     Resp 18     Temp 98.6 F (37 C)     Temp src      SpO2 100 %     Weight      Height      Head Circumference      Peak Flow      Pain Score      Pain Loc      Pain Edu?      Excl. in GC?    No data found.  Updated Vital Signs BP 125/73   Pulse (!) 107   Temp 98.6 F (37 C)   Resp 18   SpO2 100%   Physical Exam  Constitutional: He is oriented to person, place, and time. He appears well-developed and well-nourished. No distress.  HENT:  Head: Normocephalic and  atraumatic.  Right Ear: External ear and ear canal normal. Tympanic membrane is erythematous. Tympanic membrane is not bulging.  Left Ear: External ear and ear canal normal. Tympanic membrane is erythematous. Tympanic membrane is not bulging.  Nose: Mucosal edema and rhinorrhea present. Right sinus exhibits maxillary sinus tenderness. Right sinus exhibits no frontal sinus tenderness. Left sinus exhibits maxillary sinus tenderness. Left sinus exhibits no frontal sinus tenderness.  Mouth/Throat: Uvula is midline and mucous membranes are normal. Posterior oropharyngeal erythema present. Tonsils are 1+ on the right. Tonsils are 1+ on the left. No tonsillar exudate.  Eyes: Conjunctivae are normal. Pupils are equal, round, and reactive to light.  Neck: Normal range of motion. Neck supple.  Cardiovascular: Normal rate, regular rhythm and normal heart sounds. Exam reveals no gallop and no friction rub.  No murmur heard. Pulmonary/Chest: Effort normal and breath sounds  normal. He has no decreased breath sounds. He has no wheezes. He has no rhonchi. He has no rales.  Lymphadenopathy:    He has no cervical adenopathy.  Neurological: He is alert and oriented to person, place, and time.  Skin: Skin is warm and dry.  Psychiatric: He has a normal mood and affect. His behavior is normal. Judgment normal.     UC Treatments / Results  Labs (all labs ordered are listed, but only abnormal results are displayed) Labs Reviewed - No data to display  EKG  EKG Interpretation None       Radiology No results found.  Procedures Procedures (including critical care time)  Medications Ordered in UC Medications - No data to display   Initial Impression / Assessment and Plan / UC Course  I have reviewed the triage vital signs and the nursing notes.  Pertinent labs & imaging results that were available during my care of the patient were reviewed by me and considered in my medical decision making (see chart for details).    Discussed with patient history and exam most consistent with viral URI. Symptomatic treatment as needed. Push fluids. Return precautions given.   Final Clinical Impressions(s) / UC Diagnoses   Final diagnoses:  Viral syndrome    ED Discharge Orders        Ordered    benzonatate (TESSALON) 100 MG capsule  Every 8 hours     04/06/17 1523    cetirizine (ZYRTEC) 10 MG tablet  Daily     04/06/17 1523    fluticasone (FLONASE) 50 MCG/ACT nasal spray  Daily     04/06/17 1523    ipratropium (ATROVENT) 0.06 % nasal spray  4 times daily     04/06/17 1523        Belinda Fisher, PA-C 04/06/17 1527

## 2017-04-06 NOTE — ED Triage Notes (Signed)
Pt here for URI symptoms x 2 days.  

## 2017-06-28 ENCOUNTER — Other Ambulatory Visit: Payer: Self-pay

## 2017-06-28 ENCOUNTER — Inpatient Hospital Stay (HOSPITAL_COMMUNITY)
Admission: EM | Admit: 2017-06-28 | Discharge: 2017-07-01 | DRG: 637 | Disposition: A | Discharge status: Home / Self Care | Payer: BC Managed Care – PPO | Attending: Internal Medicine | Admitting: Internal Medicine

## 2017-06-28 ENCOUNTER — Emergency Department (HOSPITAL_COMMUNITY): Payer: BC Managed Care – PPO

## 2017-06-28 ENCOUNTER — Encounter (HOSPITAL_COMMUNITY): Payer: Self-pay | Admitting: *Deleted

## 2017-06-28 DIAGNOSIS — Z833 Family history of diabetes mellitus: Secondary | ICD-10-CM

## 2017-06-28 DIAGNOSIS — R634 Abnormal weight loss: Secondary | ICD-10-CM | POA: Diagnosis not present

## 2017-06-28 DIAGNOSIS — B370 Candidal stomatitis: Secondary | ICD-10-CM | POA: Diagnosis present

## 2017-06-28 DIAGNOSIS — E860 Dehydration: Secondary | ICD-10-CM | POA: Diagnosis present

## 2017-06-28 DIAGNOSIS — J189 Pneumonia, unspecified organism: Secondary | ICD-10-CM | POA: Diagnosis present

## 2017-06-28 DIAGNOSIS — E1110 Type 2 diabetes mellitus with ketoacidosis without coma: Secondary | ICD-10-CM | POA: Diagnosis not present

## 2017-06-28 DIAGNOSIS — Z794 Long term (current) use of insulin: Secondary | ICD-10-CM | POA: Diagnosis not present

## 2017-06-28 DIAGNOSIS — E871 Hypo-osmolality and hyponatremia: Secondary | ICD-10-CM | POA: Diagnosis present

## 2017-06-28 DIAGNOSIS — J181 Lobar pneumonia, unspecified organism: Secondary | ICD-10-CM | POA: Diagnosis present

## 2017-06-28 DIAGNOSIS — E1010 Type 1 diabetes mellitus with ketoacidosis without coma: Secondary | ICD-10-CM | POA: Diagnosis not present

## 2017-06-28 DIAGNOSIS — R0989 Other specified symptoms and signs involving the circulatory and respiratory systems: Secondary | ICD-10-CM | POA: Diagnosis not present

## 2017-06-28 LAB — URINALYSIS, ROUTINE W REFLEX MICROSCOPIC
Bacteria, UA: NONE SEEN
Bilirubin Urine: NEGATIVE
Glucose, UA: >=500 mg/dL — AB
Hgb urine dipstick: NEGATIVE
Ketones, ur: 80 mg/dL — AB
Leukocytes, UA: NEGATIVE
Nitrite: NEGATIVE
Protein, ur: 30 mg/dL — AB
Specific Gravity, Urine: 1.03 (ref 1.005–1.030)
Squamous Epithelial / LPF: NONE SEEN
pH: 5 (ref 5.0–8.0)

## 2017-06-28 LAB — CBC WITH DIFFERENTIAL/PLATELET
Basophils Absolute: 0 10*3/uL (ref 0.0–0.1)
Basophils Relative: 0 %
Eosinophils Absolute: 0 10*3/uL (ref 0.0–0.7)
Eosinophils Relative: 0 %
HCT: 40.7 % (ref 39.0–52.0)
Hemoglobin: 13.6 g/dL (ref 13.0–17.0)
Lymphocytes Relative: 14 %
Lymphs Abs: 2 10*3/uL (ref 0.7–4.0)
MCH: 27.5 pg (ref 26.0–34.0)
MCHC: 33.4 g/dL (ref 30.0–36.0)
MCV: 82.4 fL (ref 78.0–100.0)
Monocytes Absolute: 1.1 10*3/uL — ABNORMAL HIGH (ref 0.1–1.0)
Monocytes Relative: 8 %
Neutro Abs: 10.9 10*3/uL — ABNORMAL HIGH (ref 1.7–7.7)
Neutrophils Relative %: 78 %
Platelets: 237 10*3/uL (ref 150–400)
RBC: 4.94 MIL/uL (ref 4.22–5.81)
RDW: 12.1 % (ref 11.5–15.5)
WBC: 14 10*3/uL — ABNORMAL HIGH (ref 4.0–10.5)

## 2017-06-28 LAB — BASIC METABOLIC PANEL
Anion gap: 19 — ABNORMAL HIGH (ref 5–15)
Anion gap: 9 (ref 5–15)
Anion gap: 12 (ref 5–15)
BUN: 8 mg/dL (ref 6–20)
BUN: 6 mg/dL (ref 6–20)
BUN: 6 mg/dL (ref 6–20)
CO2: 15 mmol/L — ABNORMAL LOW (ref 22–32)
CO2: 22 mmol/L (ref 22–32)
CO2: 23 mmol/L (ref 22–32)
Calcium: 8.9 mg/dL (ref 8.9–10.3)
Calcium: 8.3 mg/dL — ABNORMAL LOW (ref 8.9–10.3)
Calcium: 8.7 mg/dL — ABNORMAL LOW (ref 8.9–10.3)
Chloride: 94 mmol/L — ABNORMAL LOW (ref 101–111)
Chloride: 95 mmol/L — ABNORMAL LOW (ref 101–111)
Chloride: 94 mmol/L — ABNORMAL LOW (ref 101–111)
Creatinine, Ser: 0.98 mg/dL (ref 0.61–1.24)
Creatinine, Ser: 0.6 mg/dL — ABNORMAL LOW (ref 0.61–1.24)
Creatinine, Ser: 0.71 mg/dL (ref 0.61–1.24)
GFR calc Af Amer: >60 mL/min (ref >60)
GFR calc Af Amer: >60 mL/min (ref >60)
GFR calc Af Amer: >60 mL/min (ref >60)
GFR calc non Af Amer: >60 mL/min (ref >60)
GFR calc non Af Amer: >60 mL/min (ref >60)
GFR calc non Af Amer: >60 mL/min (ref >60)
Glucose, Bld: 319 mg/dL — ABNORMAL HIGH (ref 65–99)
Glucose, Bld: 331 mg/dL — ABNORMAL HIGH (ref 65–99)
Glucose, Bld: 176 mg/dL — ABNORMAL HIGH (ref 65–99)
Potassium: 5.4 mmol/L — ABNORMAL HIGH (ref 3.5–5.1)
Potassium: 3.4 mmol/L — ABNORMAL LOW (ref 3.5–5.1)
Potassium: 3.9 mmol/L (ref 3.5–5.1)
Sodium: 128 mmol/L — ABNORMAL LOW (ref 135–145)
Sodium: 126 mmol/L — ABNORMAL LOW (ref 135–145)
Sodium: 129 mmol/L — ABNORMAL LOW (ref 135–145)

## 2017-06-28 LAB — PROCALCITONIN: Procalcitonin: 2.12 ng/mL

## 2017-06-28 LAB — STREP PNEUMONIAE URINARY ANTIGEN: Strep Pneumo Urinary Antigen: NEGATIVE

## 2017-06-28 LAB — COMPREHENSIVE METABOLIC PANEL
ALT: 24 U/L (ref 17–63)
AST: 24 U/L (ref 15–41)
Albumin: 3.2 g/dL — ABNORMAL LOW (ref 3.5–5.0)
Alkaline Phosphatase: 127 U/L — ABNORMAL HIGH (ref 38–126)
Anion gap: 18 — ABNORMAL HIGH (ref 5–15)
BUN: 7 mg/dL (ref 6–20)
CO2: 20 mmol/L — ABNORMAL LOW (ref 22–32)
Calcium: 9.3 mg/dL (ref 8.9–10.3)
Chloride: 88 mmol/L — ABNORMAL LOW (ref 101–111)
Creatinine, Ser: 1.01 mg/dL (ref 0.61–1.24)
GFR calc Af Amer: >60 mL/min (ref >60)
GFR calc non Af Amer: >60 mL/min (ref >60)
Glucose, Bld: 368 mg/dL — ABNORMAL HIGH (ref 65–99)
Potassium: 4.4 mmol/L (ref 3.5–5.1)
Sodium: 126 mmol/L — ABNORMAL LOW (ref 135–145)
Total Bilirubin: 1.5 mg/dL — ABNORMAL HIGH (ref 0.3–1.2)
Total Protein: 7.4 g/dL (ref 6.5–8.1)

## 2017-06-28 LAB — CBG MONITORING, ED
Glucose-Capillary: 323 mg/dL — ABNORMAL HIGH (ref 65–99)
Glucose-Capillary: 315 mg/dL — ABNORMAL HIGH (ref 65–99)
Glucose-Capillary: 308 mg/dL — ABNORMAL HIGH (ref 65–99)
Glucose-Capillary: 297 mg/dL — ABNORMAL HIGH (ref 65–99)
Glucose-Capillary: 241 mg/dL — ABNORMAL HIGH (ref 65–99)
Glucose-Capillary: 231 mg/dL — ABNORMAL HIGH (ref 65–99)
Glucose-Capillary: 204 mg/dL — ABNORMAL HIGH (ref 65–99)
Glucose-Capillary: 209 mg/dL — ABNORMAL HIGH (ref 65–99)
Glucose-Capillary: 149 mg/dL — ABNORMAL HIGH (ref 65–99)
Glucose-Capillary: 154 mg/dL — ABNORMAL HIGH (ref 65–99)
Glucose-Capillary: 166 mg/dL — ABNORMAL HIGH (ref 65–99)
Glucose-Capillary: 130 mg/dL — ABNORMAL HIGH (ref 65–99)

## 2017-06-28 LAB — I-STAT CG4 LACTIC ACID, ED: Lactic Acid, Venous: 1.62 mmol/L (ref 0.5–1.9)

## 2017-06-28 LAB — INFLUENZA PANEL BY PCR (TYPE A & B)
Influenza A By PCR: NEGATIVE
Influenza B By PCR: NEGATIVE

## 2017-06-28 LAB — HEMOGLOBIN A1C
Hgb A1c MFr Bld: 14 % — ABNORMAL HIGH (ref 4.8–5.6)
Mean Plasma Glucose: 355.1 mg/dL

## 2017-06-28 LAB — GLUCOSE, CAPILLARY: Glucose-Capillary: 145 mg/dL — ABNORMAL HIGH (ref 65–99)

## 2017-06-28 MED ORDER — SODIUM CHLORIDE 0.9 % IV SOLN
500.0000 mg | Freq: Once | INTRAVENOUS | Status: AC
  Administered 2017-06-28: 500 mg via INTRAVENOUS
  Filled 2017-06-28: qty 500

## 2017-06-28 MED ORDER — NYSTATIN 100000 UNIT/ML MT SUSP
5.0000 mL | Freq: Four times a day (QID) | OROMUCOSAL | Status: DC
  Administered 2017-06-28 – 2017-07-01 (×13): 500000 [IU] via ORAL
  Filled 2017-06-28 (×15): qty 5

## 2017-06-28 MED ORDER — ENOXAPARIN SODIUM 40 MG/0.4ML ~~LOC~~ SOLN
40.0000 mg | SUBCUTANEOUS | Status: DC
  Administered 2017-06-29 – 2017-07-01 (×3): 40 mg via SUBCUTANEOUS
  Filled 2017-06-28 (×3): qty 0.4

## 2017-06-28 MED ORDER — AZITHROMYCIN 500 MG IV SOLR
500.0000 mg | INTRAVENOUS | Status: DC
  Administered 2017-06-30 – 2017-07-01 (×2): 500 mg via INTRAVENOUS
  Filled 2017-06-28 (×3): qty 500

## 2017-06-28 MED ORDER — SODIUM CHLORIDE 0.9 % IV SOLN
INTRAVENOUS | Status: DC
  Administered 2017-06-28: 2.6 [IU]/h via INTRAVENOUS
  Filled 2017-06-28: qty 1

## 2017-06-28 MED ORDER — SODIUM CHLORIDE 0.9 % IV BOLUS
1000.0000 mL | Freq: Once | INTRAVENOUS | Status: AC
  Administered 2017-06-28: 1000 mL via INTRAVENOUS

## 2017-06-28 MED ORDER — POTASSIUM CHLORIDE 10 MEQ/100ML IV SOLN
10.0000 meq | INTRAVENOUS | Status: AC
  Administered 2017-06-28 (×2): 10 meq via INTRAVENOUS
  Filled 2017-06-28 (×2): qty 100

## 2017-06-28 MED ORDER — INSULIN ASPART 100 UNIT/ML ~~LOC~~ SOLN
5.0000 [IU] | Freq: Once | SUBCUTANEOUS | Status: AC
  Administered 2017-06-28: 5 [IU] via SUBCUTANEOUS
  Filled 2017-06-28: qty 1

## 2017-06-28 MED ORDER — SODIUM CHLORIDE 0.9 % IV SOLN
INTRAVENOUS | Status: AC
  Administered 2017-06-28: 14:00:00 via INTRAVENOUS

## 2017-06-28 MED ORDER — SODIUM CHLORIDE 0.9 % IV SOLN
1.0000 g | INTRAVENOUS | Status: DC
  Administered 2017-06-29 – 2017-07-01 (×3): 1 g via INTRAVENOUS
  Filled 2017-06-28 (×4): qty 10

## 2017-06-28 MED ORDER — SODIUM CHLORIDE 0.9 % IV SOLN
1.0000 g | Freq: Once | INTRAVENOUS | Status: AC
  Administered 2017-06-28: 1 g via INTRAVENOUS
  Filled 2017-06-28: qty 10

## 2017-06-28 MED ORDER — SODIUM CHLORIDE 0.9 % IV SOLN
INTRAVENOUS | Status: DC
  Administered 2017-06-28: 10:00:00 via INTRAVENOUS

## 2017-06-28 MED ORDER — SODIUM CHLORIDE 0.9 % IV SOLN
INTRAVENOUS | Status: DC
  Administered 2017-06-28: 4.8 [IU]/h via INTRAVENOUS
  Administered 2017-06-29: 6.2 [IU]/h via INTRAVENOUS
  Filled 2017-06-28 (×2): qty 1

## 2017-06-28 MED ORDER — DEXTROSE-NACL 5-0.45 % IV SOLN
INTRAVENOUS | Status: DC
  Administered 2017-06-28 – 2017-06-29 (×3): via INTRAVENOUS
  Administered 2017-06-29 – 2017-06-30 (×2): 125 mL/h via INTRAVENOUS

## 2017-06-28 MED ORDER — SODIUM CHLORIDE 0.9 % IV SOLN: INTRAVENOUS | Status: DC

## 2017-06-28 MED ORDER — DEXTROSE-NACL 5-0.45 % IV SOLN: INTRAVENOUS | Status: DC

## 2017-06-28 MED ORDER — ONDANSETRON HCL 4 MG/2ML IJ SOLN
4.0000 mg | Freq: Four times a day (QID) | INTRAMUSCULAR | Status: DC | PRN
Start: 2017-06-28 — End: 2017-07-01

## 2017-06-28 MED ORDER — IBUPROFEN 600 MG PO TABS
600.0000 mg | ORAL_TABLET | ORAL | Status: DC | PRN
  Administered 2017-06-28 – 2017-06-29 (×2): 600 mg via ORAL
  Filled 2017-06-28: qty 1

## 2017-06-28 MED ORDER — ACETAMINOPHEN 325 MG PO TABS
650.0000 mg | ORAL_TABLET | Freq: Once | ORAL | Status: AC | PRN
  Administered 2017-06-28: 650 mg via ORAL
  Filled 2017-06-28: qty 2

## 2017-06-28 MED ORDER — ACETAMINOPHEN 325 MG PO TABS
650.0000 mg | ORAL_TABLET | Freq: Four times a day (QID) | ORAL | Status: DC | PRN
  Administered 2017-06-28 – 2017-06-30 (×2): 650 mg via ORAL
  Filled 2017-06-28: qty 2

## 2017-06-28 MED ORDER — POTASSIUM CHLORIDE 10 MEQ/100ML IV SOLN: 10.0000 meq | INTRAVENOUS | Status: DC

## 2017-06-28 NOTE — ED Notes (Signed)
Blood cultures collected after starting abx

## 2017-06-28 NOTE — ED Triage Notes (Addendum)
Pt has had flu-like symptoms (generalized body aches, wanting to sleep frequently) and R sided low back pain; reports increased blood sugars at home. Noted to be febrile in triage

## 2017-06-28 NOTE — ED Notes (Signed)
Pt given water, per Seward GraterMaggie, RN.

## 2017-06-28 NOTE — ED Notes (Signed)
Report attempted x 1

## 2017-06-28 NOTE — ED Notes (Signed)
Patient transported to X-ray 

## 2017-06-28 NOTE — H&P (Signed)
History and Physical    Max Taher ZOX:[id number] DOB: 1985/08/22 DOA: 06/28/2017  PCP:  Renford DillsPolite, Ronald Consultants:  Sharl MaKerr - endocrinology Patient coming from:  Home - lives with girlfriend; NOK: Greta Doomunt, Bernice Stewart, [phone]  Chief Complaint: flu-like illness  HPI: [name] is a 32 y.o. male with medical history significant of DM who presents with flu-like illness.  Symptoms started Sunday.  He slept all day.  Monday, he went to work and his back started hurting.  Yesterday, he developed chills and SOB, fatigue.  He has not had insulin the last couple of days because he felt so bad.  +cough, nonproductive.  +SOB.  No wheezing.  Hurts all over.  He did not get a flu shot this year.  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   ED Course:  Glucose 368, anion gap 18, CO2 20.  IVF, subQ insulin.  RLL PNA - given Rocephin and Azithromycin.  Review of Systems: As per HPI; otherwise review of systems reviewed and negative.   Ambulatory Status:  Ambulates without assistance  Past Medical History:  Diagnosis Date  . Diabetes mellitus     History reviewed. No pertinent surgical history.  Social History   Socioeconomic History  . Marital status: Single    Spouse name: Not on file  . Number of children: Not on file  . Years of education: Not on file  . Highest education level: Not on file  Occupational History  . Occupation: Nurse, learning disabilityC teacher at [employer]  Social Needs  . Financial resource strain: Not on file  . Food insecurity:    Worry: Not on file    Inability: Not on file  . Transportation needs:    Medical: Not on file    Non-medical: Not on file  Tobacco Use  . Smoking status: Never Smoker  . Smokeless tobacco: Never Used  Substance and Sexual Activity  . Alcohol use: No  . Drug use: No  . Sexual activity: Not on file  Lifestyle  . Physical activity:    Days per week: Not on file    Minutes per session: Not on file  .  Stress: Not on file  Relationships  . Social connections:    Talks on phone: Not on file    Gets together: Not on file    Attends religious service: Not on file    Active member of club or organization: Not on file    Attends meetings of clubs or organizations: Not on file    Relationship status: Not on file  . Intimate partner violence:    Fear of current or ex partner: Not on file    Emotionally abused: Not on file    Physically abused: Not on file    Forced sexual activity: Not on file  Other Topics Concern  . Not on file  Social History Narrative  . Not on file    Allergies  Allergen Reactions  . Metformin And Related     Family History  Problem Relation Age of Onset  . Hypertension Other   . Diabetes Other     Prior to Admission medications   Medication Sig Start Date End Date Taking? Authorizing Provider  benzonatate (TESSALON) 100 MG capsule Take 1 capsule (100 mg total) by mouth every 8 (eight) hours. 04/06/17   Cathie HoopsYu, Amy V, PA-C  cetirizine (ZYRTEC) 10 MG tablet Take 1 tablet (10 mg total) by mouth daily. 04/06/17   Cathie HoopsYu,  Amy V, PA-C  fluticasone (FLONASE) 50 MCG/ACT nasal spray Place 2 sprays into both nostrils daily. 04/06/17   Cathie Hoops, Amy V, PA-C  HYDROcodone-acetaminophen (NORCO/VICODIN) 5-325 MG tablet Take 1 tablet by mouth every 4 (four) hours as needed for moderate pain. 04/14/16   Azalia Bilis, MD  ibuprofen (ADVIL,MOTRIN) 400 MG tablet Take 1 tablet (400 mg total) by mouth every 8 (eight) hours as needed. 04/14/16   Azalia Bilis, MD  insulin glargine (LANTUS) 100 UNIT/ML injection Inject into the skin as needed.    [provider]  ipratropium (ATROVENT) 0.06 % nasal spray Place 2 sprays into both nostrils 4 (four) times daily. 04/06/17   Cathie Hoops, Amy V, PA-C  insulin aspart (NOVOLOG) 100 UNIT/ML SOCT cartridge Inject 5 Units into the skin 3 (three) times daily with meals. Patient not taking: Reported on 10/30/2014 01/22/13 12/28/14  Kathlen Mody, MD    Physical  Exam: Vitals:   06/28/17 0153 06/28/17 0815 06/28/17 1042  BP: (!) 151/90 140/73 (!) 124/55  Pulse: (!) 114 (!) 106 (!) 115  Resp: 16  (!) 24  Temp: (!) 101.9 F (38.8 C) 99.6 F (37.6 C)   TempSrc: Oral Oral   SpO2: 100% 96% 97%  Weight: 108.9 kg (240 lb)    Height: 6\' 5"  (1.956 m)       General:  Appears moderately ill but is NAD Eyes:  PERRL, EOMI, normal lids, iris ENT:  grossly normal hearing, lips; tongue with diffuse whitish plaques, less prevalent on buccal mucosa, mmm; appropriate dentition Neck:  no LAD, masses or thyromegaly; no carotid bruits Cardiovascular:  Tachycardia, no m/r/g. No LE edema.  Respiratory:   CTA bilaterally with no wheezes/rales/rhonchi.  Normal respiratory effort. Abdomen:  soft, NT, ND, NABS Skin:  no rash or induration seen on limited exam Musculoskeletal:  grossly normal tone BUE/BLE, good ROM, no bony abnormality; reluctance to turn/move due to generalized myalgias Lower extremity:  No LE edema.  Limited foot exam with no ulcerations.  2+ distal pulses. Psychiatric:  blunted mood and affect, speech fluent and appropriate, AOx3 Neurologic:  CN 2-12 grossly intact, moves all extremities in coordinated fashion, sensation intact    Radiological Exams on Admission: Dg Chest 2 View  Result Date: 06/28/2017 CLINICAL DATA:  Right-sided pain and fever EXAM: CHEST - 2 VIEW COMPARISON:  None. FINDINGS: There is consolidation in the right lower lobe, primarily medially. Lungs elsewhere are clear. Heart size and pulmonary vascularity are normal. No adenopathy. No bone lesions peer IMPRESSION: Medial segment right lower lobe consolidation consistent with pneumonia. Lungs elsewhere clear. No adenopathy. Electronically Signed   By: Bretta Bang III M.D.   On: 06/28/2017 09:54    EKG: not done   Labs on Admission: I have personally reviewed the available labs and imaging studies at the time of the admission.  Pertinent labs:   CO2 20 -> 15 Glucose  368 -> 319 Anion gap 18 -> 19 AP 127 Albumin 3.2 Bili 1.5 WBC 14.0 Lactate 1.62 Flu negative UA: >500 glucose, 80 ketones, 30 protein  Assessment/Plan Principal Problem:   DKA (diabetic ketoacidosis) (HCC) Active Problems:   CAP (community acquired pneumonia)   DKA -Patient with uncertain baseline control - he reports reasonable control but A1c in 2012 was 11.5, glucose 361 in 10/17 and 307 in 1/18 -He reports not taking insulin for the last few days since he felt so bad -DKA likely resulting from PNA with possible sepsis -Borderline DKA on presentation but patient has progressed to  frank DKA at this time -Will admit to SDU with DKA protocol -Would recommend continuing insulin drip at least until morning regardless of rapidity of closure of gap and normalization of labs -K+ slightly increased after potassium given in ER, will follow and replete when <5 -IVF at 150 cc/hr, NS until glucose <250 and then decrease rate to 125 and change to D51/2NS -Likely to benefit from daily Lantus dosing rather than sliding scale  CAP with possible sepsis -Elevated WBC count, tachycardia, tachypnea  -While awaiting blood cultures, this appears to be a preseptic condition. -Will admit to SDU with telemetry and continue to monitor -Will add HIV - particularly in the setting of oral thrush (which could also be related to poorly controlled DM) -Given cough, SOB, and infiltrate in right lower lobe on chest x-ray , most likely community-acquired pneumonia.  -Influenza negative. -Pneumonia Severity Index (PSI) is Class 1, 0.1% mortality. -Will start Azithromycin 500 mg daily AND Rocephin due to no risk factors for MDR cause. -Fever control -Repeat CBC in am -Sputum cultures -Blood cultures -Strep pneumo testing -Will order lower respiratory tract procalcitonin level.  Antibiotics would not be indicated for PCT <0.1 and probably should not be used for < 0.25.  >0.5 indicates infection and >>0.5  indicates more serious disease.  As the procalcitonin level normalizes, it will be reasonable to consider de-escalation of antibiotic coverage.   DVT prophylaxis:  Lovenox  Code Status:   Full - confirmed with patient Family Communication: None present Disposition Plan:  Home once clinically improved Consults called: None  Admission status: Admit - It is my clinical opinion that admission to INPATIENT is reasonable and necessary because of the expectation that this patient will require hospital care that crosses at least 2 midnights to treat this condition based on the medical complexity of the problems presented.  Given the aforementioned information, the predictability of an adverse outcome is felt to be significant.    Jonah Blue MD [facility]  If note is complete, please contact covering daytime or nighttime physician. [email or web address] Password [email or web address]  06/28/2017, 1:03 PM

## 2017-06-28 NOTE — ED Provider Notes (Signed)
[name]GENC[facility] Provider Note   CSN: [id number] Arrival date & time: 06/28/17  0141     History   Chief Complaint Chief Complaint  Patient presents with  . Influenza    HPI [name] is a 32 y.o. male w PMHx insulin-dependent T2DM, presenting to the ED with complaints of generalized myalgias since Monday.  Patient states he noted his blood sugars were high starting Sunday, and the 2-300s, which he covered with insulin.  He states Monday he began having generalized myalgias, with back pain. Has taken ibuprofen with moderate relief of pain. He states he feels as though his chest is tight, however denies significant upper respiratory symptoms.  Does report associated diarrhea.  Patient noted to be febrile in triage.  Denies sore throat, congestion, ear pain, cough, abdominal pain, nausea or vomiting, urinary symptoms, headache or vision changes. Has not administered any insulin today.  States his PCP is Dr. Nehemiah SettlePolite.  The history is provided by the patient.    Past Medical History:  Diagnosis Date  . Diabetes mellitus     Patient Active Problem List   Diagnosis Date Noted  . 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 Hyperglycemia without ketosis 01/19/2013    History reviewed. No pertinent surgical history.      Home Medications    Prior to Admission medications   Medication Sig Start Date End Date Taking? Authorizing Provider  insulin glargine (LANTUS) 100 UNIT/ML injection Inject into the skin as needed. Sliding scale as directed    [provider]  insulin aspart (NOVOLOG) 100 UNIT/ML SOCT cartridge Inject 5 Units into the skin 3 (three) times daily with meals. Patient not taking: Reported on 10/30/2014 01/22/13 12/28/14  Kathlen ModyAkula, Vijaya, MD    Family History Family History  Problem Relation Age of Onset  . Hypertension Other   . Diabetes Other     Social History Social History   Tobacco  Use  . Smoking status: Never Smoker  . Smokeless tobacco: Never Used  Substance Use Topics  . Alcohol use: No  . Drug use: No     Allergies   Metformin and related   Review of Systems Review of Systems  Constitutional: Positive for fatigue and fever.  HENT: Negative for congestion, ear pain and sore throat.   Eyes: Negative for visual disturbance.  Respiratory: Positive for chest tightness. Negative for cough and shortness of breath.   Gastrointestinal: Positive for diarrhea. Negative for abdominal pain, blood in stool, nausea and vomiting.  Genitourinary: Negative for dysuria and frequency.  Musculoskeletal: Positive for back pain and myalgias.  Neurological: Negative for headaches.  All other systems reviewed and are negative.    Physical Exam Updated Vital Signs BP (!) 124/55   Pulse (!) 115   Temp 99.6 F (37.6 C) (Oral)   Resp (!) 24   Ht 6\' 5"  (1.956 m)   Wt 108.9 kg (240 lb)   SpO2 97%   BMI 28.46 kg/m   Physical Exam  Constitutional: He is oriented to person, place, and time. He appears well-developed and well-nourished. He appears ill. No distress.  HENT:  Head: Normocephalic and atraumatic.  Right Ear: Tympanic membrane, external ear and ear canal normal.  Left Ear: Tympanic membrane, external ear and ear canal normal.  Mouth/Throat: Uvula is midline. No trismus in the jaw. No uvula swelling.  Pharynx is mildly erythematous, no edema or exudates. White coating to tongue.  Eyes: Pupils are equal,  round, and reactive to light. Conjunctivae and EOM are normal.  Neck: Normal range of motion. Neck supple.  No nuchal rigidity or meningismus  Cardiovascular: Regular rhythm, normal heart sounds and intact distal pulses.  tachycardic  Pulmonary/Chest: Effort normal and breath sounds normal. No respiratory distress.  Abdominal: Soft. Bowel sounds are normal. He exhibits no distension and no mass. There is no tenderness. There is no rebound and no guarding.    Lymphadenopathy:    He has no cervical adenopathy.  Neurological: He is alert and oriented to person, place, and time.  Skin: Skin is warm.  Psychiatric: He has a normal mood and affect. His behavior is normal.  Nursing note and vitals reviewed.    ED Treatments / Results  Labs (all labs ordered are listed, but only abnormal results are displayed) Labs Reviewed  COMPREHENSIVE METABOLIC PANEL - Abnormal; Notable for the following components:      Result Value   Sodium 126 (*)    Chloride 88 (*)    CO2 20 (*)    Glucose, Bld 368 (*)    Albumin 3.2 (*)    Alkaline Phosphatase 127 (*)    Total Bilirubin 1.5 (*)    Anion gap 18 (*)    All other components within normal limits  CBC WITH DIFFERENTIAL/PLATELET - Abnormal; Notable for the following components:   WBC 14.0 (*)    Neutro Abs 10.9 (*)    Monocytes Absolute 1.1 (*)    All other components within normal limits  CBG MONITORING, ED - Abnormal; Notable for the following components:   Glucose-Capillary 323 (*)    All other components within normal limits  CBG MONITORING, ED - Abnormal; Notable for the following components:   Glucose-Capillary 315 (*)    All other components within normal limits  CBG MONITORING, ED - Abnormal; Notable for the following components:   Glucose-Capillary 308 (*)    All other components within normal limits  URINALYSIS, ROUTINE W REFLEX MICROSCOPIC  BASIC METABOLIC PANEL  INFLUENZA PANEL BY PCR (TYPE A & B)  I-STAT CG4 LACTIC ACID, ED    EKG None  Radiology Dg Chest 2 View  Result Date: 06/28/2017 CLINICAL DATA:  Right-sided pain and fever EXAM: CHEST - 2 VIEW COMPARISON:  None. FINDINGS: There is consolidation in the right lower lobe, primarily medially. Lungs elsewhere are clear. Heart size and pulmonary vascularity are normal. No adenopathy. No bone lesions peer IMPRESSION: Medial segment right lower lobe consolidation consistent with pneumonia. Lungs elsewhere clear. No adenopathy.  Electronically Signed   By: Bretta Bang III M.D.   On: 06/28/2017 09:54    Procedures Procedures (including critical care time) CRITICAL CARE Performed by: [place] N Reed Eifert   Total critical care time: 40 minutes  Critical care time was exclusive of separately billable procedures and treating other patients.  Critical care was necessary to treat or prevent imminent or life-threatening deterioration.  Critical care was time spent personally by me on the following activities: development of treatment plan with patient and/or surrogate as well as nursing, discussions with consultants, evaluation of patient's response to treatment, examination of patient, obtaining history from patient or surrogate, ordering and performing treatments and interventions, ordering and review of laboratory studies, ordering and review of radiographic studies, pulse oximetry and re-evaluation of patient's condition.   Medications Ordered in ED Medications  insulin regular (NOVOLIN R,HUMULIN R) 100 Units in sodium chloride 0.9 % 100 mL (1 Units/mL) infusion (2.6 Units/hr Intravenous New Bag/Given 06/28/17 1041)  dextrose 5 %-0.45 % sodium chloride infusion ( Intravenous Hold 06/28/17 1150)  0.9 %  sodium chloride infusion ( Intravenous New Bag/Given 06/28/17 1000)  potassium chloride 10 mEq in 100 mL IVPB (10 mEq Intravenous New Bag/Given 06/28/17 1149)  cefTRIAXone (ROCEPHIN) 1 g in sodium chloride 0.9 % 100 mL IVPB (has no administration in time range)  azithromycin (ZITHROMAX) 500 mg in sodium chloride 0.9 % 250 mL IVPB (500 mg Intravenous New Bag/Given 06/28/17 1150)  acetaminophen (TYLENOL) tablet 650 mg (650 mg Oral Given 06/28/17 0204)  sodium chloride 0.9 % bolus 1,000 mL (0 mLs Intravenous Stopped 06/28/17 0914)  insulin aspart (novoLOG) injection 5 Units (5 Units Subcutaneous Given 06/28/17 1030)     Initial Impression / Assessment and Plan / ED Course  I have reviewed the triage vital signs and the  nursing notes.  Pertinent labs & imaging results that were available during my care of the patient were reviewed by me and considered in my medical decision making (see chart for details).  Clinical Course as of Jun 28 1221  Wed Jun 28, 2017  0755 Corrected sodium 130-132  Sodium(!): 126 [JR]  1057 Dr. Ophelia Charter with [facility] accepting admission.   [JR]    Clinical Course User Index [JR] Breck Hollinger, [place] N, PA-C    Pt presenting with fever, myalgias, diarrhea and hyperglycemia. On arrival, pt febrile and tachycardic. Ill-appearing on exam, though not in distress. Tolerating secretions. Lungs CTAB. Abd benign. BG 368, with anion gap of 18 and bicarb 20. Hyponatremic, with correct Na of 130.  Leukocytosis of 14.0. Fluids administered for DKA. Insulin. CXR ordered. Anticipate admission for DKA likely due to infection, once CXR results. Pt discussed with Dr. Adela Lank who agrees with care plan.   CXR with right lower lobe consolidation consistent with PNA. Rocephin and azithromycin ordered. Dr. Ophelia Charter with [facility] accepting admission for DKA with CAP.  The patient appears reasonably stabilized for admission considering the current resources, flow, and capabilities available in the ED at this time, and I doubt any other [facility] requiring further screening and/or treatment in the ED prior to admission.   Final Clinical Impressions(s) / ED Diagnoses   Final diagnoses:  Diabetic ketoacidosis without coma associated with type 2 diabetes mellitus (HCC)  Community acquired pneumonia of right lower lobe of lung [facility])    ED Discharge Orders    None       Harolyn Cocker, [place] N, PA-C 06/28/17 1224    Melene Plan, DO 06/28/17 1350

## 2017-06-29 ENCOUNTER — Other Ambulatory Visit: Payer: Self-pay

## 2017-06-29 DIAGNOSIS — E860 Dehydration: Secondary | ICD-10-CM

## 2017-06-29 LAB — BASIC METABOLIC PANEL
Anion gap: 10 (ref 5–15)
Anion gap: 10 (ref 5–15)
Anion gap: 9 (ref 5–15)
BUN: 5 mg/dL — ABNORMAL LOW (ref 6–20)
BUN: 6 mg/dL (ref 6–20)
BUN: 6 mg/dL (ref 6–20)
CO2: 23 mmol/L (ref 22–32)
CO2: 24 mmol/L (ref 22–32)
CO2: 26 mmol/L (ref 22–32)
Calcium: 8.7 mg/dL — ABNORMAL LOW (ref 8.9–10.3)
Calcium: 8.7 mg/dL — ABNORMAL LOW (ref 8.9–10.3)
Calcium: 8.9 mg/dL (ref 8.9–10.3)
Chloride: 96 mmol/L — ABNORMAL LOW (ref 101–111)
Chloride: 97 mmol/L — ABNORMAL LOW (ref 101–111)
Chloride: 98 mmol/L — ABNORMAL LOW (ref 101–111)
Creatinine, Ser: 0.62 mg/dL (ref 0.61–1.24)
Creatinine, Ser: 0.65 mg/dL (ref 0.61–1.24)
Creatinine, Ser: 0.69 mg/dL (ref 0.61–1.24)
GFR calc Af Amer: >60 mL/min (ref >60)
GFR calc Af Amer: >60 mL/min (ref >60)
GFR calc Af Amer: >60 mL/min (ref >60)
GFR calc non Af Amer: >60 mL/min (ref >60)
GFR calc non Af Amer: >60 mL/min (ref >60)
GFR calc non Af Amer: >60 mL/min (ref >60)
Glucose, Bld: 135 mg/dL — ABNORMAL HIGH (ref 65–99)
Glucose, Bld: 149 mg/dL — ABNORMAL HIGH (ref 65–99)
Glucose, Bld: 151 mg/dL — ABNORMAL HIGH (ref 65–99)
Potassium: 3.4 mmol/L — ABNORMAL LOW (ref 3.5–5.1)
Potassium: 3.5 mmol/L (ref 3.5–5.1)
Potassium: 3.7 mmol/L (ref 3.5–5.1)
Sodium: 130 mmol/L — ABNORMAL LOW (ref 135–145)
Sodium: 131 mmol/L — ABNORMAL LOW (ref 135–145)
Sodium: 132 mmol/L — ABNORMAL LOW (ref 135–145)

## 2017-06-29 LAB — GLUCOSE, CAPILLARY
Glucose-Capillary: 123 mg/dL — ABNORMAL HIGH (ref 65–99)
Glucose-Capillary: 127 mg/dL — ABNORMAL HIGH (ref 65–99)
Glucose-Capillary: 133 mg/dL — ABNORMAL HIGH (ref 65–99)
Glucose-Capillary: 137 mg/dL — ABNORMAL HIGH (ref 65–99)
Glucose-Capillary: 141 mg/dL — ABNORMAL HIGH (ref 65–99)
Glucose-Capillary: 142 mg/dL — ABNORMAL HIGH (ref 65–99)
Glucose-Capillary: 143 mg/dL — ABNORMAL HIGH (ref 65–99)
Glucose-Capillary: 150 mg/dL — ABNORMAL HIGH (ref 65–99)
Glucose-Capillary: 159 mg/dL — ABNORMAL HIGH (ref 65–99)
Glucose-Capillary: 243 mg/dL — ABNORMAL HIGH (ref 65–99)
Glucose-Capillary: 281 mg/dL — ABNORMAL HIGH (ref 65–99)
Glucose-Capillary: 314 mg/dL — ABNORMAL HIGH (ref 65–99)

## 2017-06-29 LAB — PROCALCITONIN: Procalcitonin: 2.89 ng/mL

## 2017-06-29 LAB — RAPID URINE DRUG SCREEN, HOSP PERFORMED
Amphetamines: NOT DETECTED
Barbiturates: NOT DETECTED
Benzodiazepines: NOT DETECTED
Cocaine: NOT DETECTED
Opiates: NOT DETECTED
Tetrahydrocannabinol: NOT DETECTED

## 2017-06-29 LAB — MRSA PCR SCREENING: MRSA by PCR: NEGATIVE

## 2017-06-29 LAB — HIV ANTIBODY (ROUTINE TESTING W REFLEX): HIV Screen 4th Generation wRfx: NONREACTIVE

## 2017-06-29 MED ORDER — INSULIN ASPART 100 UNIT/ML ~~LOC~~ SOLN
0.0000 [IU] | Freq: Three times a day (TID) | SUBCUTANEOUS | Status: DC
  Administered 2017-06-30: 11 [IU] via SUBCUTANEOUS
  Administered 2017-06-30: 8 [IU] via SUBCUTANEOUS
  Administered 2017-06-30: 5 [IU] via SUBCUTANEOUS
  Administered 2017-07-01: 8 [IU] via SUBCUTANEOUS
  Administered 2017-07-01: 11 [IU] via SUBCUTANEOUS
  Administered 2017-07-01: 5 [IU] via SUBCUTANEOUS

## 2017-06-29 MED ORDER — INSULIN GLARGINE 100 UNIT/ML ~~LOC~~ SOLN
10.0000 [IU] | Freq: Every day | SUBCUTANEOUS | Status: DC
  Administered 2017-06-29 – 2017-06-30 (×2): 10 [IU] via SUBCUTANEOUS
  Filled 2017-06-29 (×2): qty 0.1

## 2017-06-29 MED ORDER — INSULIN ASPART 100 UNIT/ML ~~LOC~~ SOLN
0.0000 [IU] | Freq: Every day | SUBCUTANEOUS | Status: DC
  Administered 2017-06-30: 3 [IU] via SUBCUTANEOUS

## 2017-06-29 NOTE — Progress Notes (Addendum)
PROGRESS NOTE  [name] ZOX:[id number] DOB: [birth year] DOA: 06/28/2017 PCP: Talmage Coin, MD  HPI/Recap of past 24 hours:  [name] is a 32 y.o. male with medical history significant of DM who presents with flu-like illness.  Symptoms started Sunday.  He slept all day.  Monday, he went to work and his back started hurting.  Yesterday, he developed chills and SOB, fatigue.  He has not had insulin the last couple of days because he felt so bad.  +cough, nonproductive.  +SOB.  No wheezing.  Hurts all over.  He did not get a flu shot this year.  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  At presentation glucose 369 15 bicarb 20 IV fluid and insulin drip started.  Chest x-ray revealed right lower lobe pneumonia and was started on IV Rocephin and IV azithromycin.  06/29/2017: Patient seen and examined at his bedside.  Reports he feels better however still has some weakness.  His cough is minimal.  Anion Gap Has Closed and Blood Sugar Less Than 200.  Patient Started on Long-Acting Insulin.  Will Continue Insulin Drip for 2 Hours and discontinue insulin drip, if anion gap remains closed.  Denies any chest pain or dyspnea.    Assessment/Plan: Principal Problem:   DKA (diabetic ketoacidosis) (HCC) Active Problems:   CAP (community acquired pneumonia)  DKA, improving Continue the DKA protocol On insulin drip Currently transitioning with long-acting insulin Repeat BMP Continue long-acting insulin, NovoLog and insulin sliding scale Diabetic diet Last A1c 14 (06/28/17)  Right lower lobe community-acquired pneumonia present on admission Stable at this time Continue IV azithromycin IV ceftriaxone day #1 Pro-calcitonin 2.89 Leukocytosis with a WBC 14 K Blood culture no growth in less than 24 hr Continue nebs Maintain O2 saturation greater than 92%  Hyponatremia Sodium 131 from 132 Patient appears euvolemic May continue IV hydration Repeat BMP in the  morning  Sinus tachycardia Heart rate between 101-112 Possibly from dehydration Continue IV fluid Monitor urine output  Acute dehydration Continue gentle IV fluid Encourage p.o. intake Monitor vital signs    Code Status: Full code  Family Communication: None at bedside  Disposition Plan: Home when clinically stable.  Possibly tomorrow 06/30/2017   Consultants:  None  Procedures:  None  Antimicrobials: IV azithromycin and IV Rocephin.  DVT prophylaxis:   SCDs, sq lovenox    Objective: Vitals:   06/28/17 2258 06/28/17 2342 06/29/17 0411 06/29/17 0857  BP: 115/76  (!) 90/54 117/75  Pulse:   (!) 101 (!) 109  Resp:      Temp:  98.6 F (37 C) 98.7 F (37.1 C) 98.6 F (37 C)  TempSrc:  Oral Oral Oral  SpO2:    99%  Weight:  95.6 kg (210 lb 12.8 oz) 95.5 kg (210 lb 8.6 oz)   Height:  6\' 5"  (1.956 m)      Intake/Output Summary (Last 24 hours) at 06/29/2017 1033 Last data filed at 06/29/2017 8119 Gross per 24 hour  Intake 3175.13 ml  Output -  Net 3175.13 ml   Filed Weights   06/28/17 0153 06/28/17 2342 06/29/17 0411  Weight: 108.9 kg (240 lb) 95.6 kg (210 lb 12.8 oz) 95.5 kg (210 lb 8.6 oz)    Exam:   General: 32 year old African-American male well-developed well-nourished in no acute distress alert and oriented x3  Cardiovascular: Regular rate and rhythm with no rubs or gallops.  No JVD present.  Respiratory: Clear to auscultation with  no wheezes or rales.  Abdomen: Abdomen is soft nontender nondistended normal bowel sounds x4 quadrant.  Musculoskeletal: No focal motor deficits.  Skin: Tattoos throughout.  No rashes no ulcerative lesions noted.  Psychiatry: Mood is appropriate for condition and setting.   Data Reviewed: CBC: Recent Labs  Lab 06/28/17 0200  WBC 14.0*  NEUTROABS 10.9*  HGB 13.6  HCT 40.7  MCV 82.4  PLT 237   Basic Metabolic Panel: Recent Labs  Lab 06/28/17 1120 06/28/17 1833 06/28/17 2038 06/28/17 2342  06/29/17 0351  NA 128* 126* 129* 130* 132*  K 5.4* 3.4* 3.9 3.5 3.4*  CL 94* 95* 94* 96* 97*  CO2 15* 22 23 24 26   GLUCOSE 319* 331* 176* 149* 151*  BUN 8 6 6  5* 6  CREATININE 0.98 0.60* 0.71 0.69 0.65  CALCIUM 8.9 8.3* 8.7* 8.7* 8.9   GFR: Estimated Creatinine Clearance: 170.2 mL/min (by C-G formula based on SCr of 0.65 mg/dL). Liver Function Tests: Recent Labs  Lab 06/28/17 0200  AST 24  ALT 24  ALKPHOS 127*  BILITOT 1.5*  PROT 7.4  ALBUMIN 3.2*   No results for input(s): LIPASE, AMYLASE in the last 168 hours. No results for input(s): AMMONIA in the last 168 hours. Coagulation Profile: No results for input(s): INR, PROTIME in the last 168 hours. Cardiac Enzymes: No results for input(s): CKTOTAL, CKMB, CKMBINDEX, TROPONINI in the last 168 hours. BNP (last 3 results) No results for input(s): PROBNP in the last 8760 hours. HbA1C: Recent Labs    06/28/17 1307  HGBA1C 14.0*   CBG: Recent Labs  Lab 06/29/17 0520 06/29/17 0624 06/29/17 0741 06/29/17 0852 06/29/17 0950  GLUCAP 141* 159* 127* 133* 137*   Lipid Profile: No results for input(s): CHOL, HDL, LDLCALC, TRIG, CHOLHDL, LDLDIRECT in the last 72 hours. Thyroid Function Tests: No results for input(s): TSH, T4TOTAL, FREET4, T3FREE, THYROIDAB in the last 72 hours. Anemia Panel: No results for input(s): VITAMINB12, FOLATE, FERRITIN, TIBC, IRON, RETICCTPCT in the last 72 hours. Urine analysis:    Component Value Date/Time   COLORURINE YELLOW 06/28/2017 1128   APPEARANCEUR CLEAR 06/28/2017 1128   LABSPEC 1.030 06/28/2017 1128   PHURINE 5.0 06/28/2017 1128   GLUCOSEU >=500 (A) 06/28/2017 1128   HGBUR NEGATIVE 06/28/2017 1128   BILIRUBINUR NEGATIVE 06/28/2017 1128   KETONESUR 80 (A) 06/28/2017 1128   PROTEINUR 30 (A) 06/28/2017 1128   UROBILINOGEN 0.2 11/07/2011 2241   NITRITE NEGATIVE 06/28/2017 1128   LEUKOCYTESUR NEGATIVE 06/28/2017 1128   Sepsis  Labs: @LABRCNTIP (procalcitonin:4,lacticidven:4)  ) Recent Results (from the past 240 hour(s))  Culture, respiratory (NON-Expectorated)     Status: None (Preliminary result)   Collection Time: 06/28/17 10:26 PM  Result Value Ref Range Status   Specimen Description SPUTUM  Final   Special Requests NONE  Final   Gram Stain   Final    ABUNDANT WBC PRESENT, PREDOMINANTLY PMN MODERATE GRAM POSITIVE COCCI FEW GRAM VARIABLE ROD Performed at [facility], [address] [place], [place] [address]    Culture PENDING  Incomplete   Report Status PENDING  Incomplete  MRSA PCR Screening     Status: None   Collection Time: 06/28/17 11:01 PM  Result Value Ref Range Status   MRSA by PCR NEGATIVE NEGATIVE Final    Comment:        The GeneXpert MRSA Assay (FDA approved for NASAL specimens only), is one component of a comprehensive MRSA colonization surveillance program. It is not intended to diagnose MRSA  infection nor to guide or monitor treatment for MRSA infections. Performed at [facility], [address] [place], [place] [address]       Studies: No results found.  Scheduled Meds: . enoxaparin (LOVENOX) injection  40 mg Subcutaneous Q24H  . insulin aspart  0-15 Units Subcutaneous TID WC  . insulin aspart  0-5 Units Subcutaneous QHS  . insulin glargine  10 Units Subcutaneous Daily  . nystatin  5 mL Oral QID    Continuous Infusions: . sodium chloride    . azithromycin    . cefTRIAXone (ROCEPHIN)  IV Stopped (06/29/17 0731)  . dextrose 5 % and 0.45% NaCl 125 mL/hr at 06/28/17 2251  . insulin (NOVOLIN-R) infusion 4.5 Units/hr (06/29/17 0631)     LOS: 1 day     Darlin Drop, MD [facility] Pager [phone]  If 7PM-7AM, please contact night-coverage [email or web address] Password [facility] 06/29/2017, 10:33 AM

## 2017-06-29 NOTE — Progress Notes (Signed)
Spoke with patient about his diabetes. States that he was diagnosed in 2012. Was seeing Dr. Sharl MaKerr as endocrinologist at that time. Was tried on Metformin for a few years, Invokana, Trulicity at different times, but were stopped due to insurance not covering them. Had been on Lantus since 2015, but changed to [place] about 1 year ago because of insurance not covering Lantus. Has not seen Dr. Sharl MaKerr since that time. States that he takes a sliding scale of Basalglar (maybe 30-40 units) daily. He could not quote his sliding scale. He states he needs to eat healthier, as well.   Recommend increasing Lantus to 18 units daily (95.5 kg X 0.2 units/kg = 19.1 units), continuing Novolog MODERATE correction scale TID & HS.  Will continue to monitor blood sugars while in the hospital.  Smith MinceKendra Keisi Eckford RN BSN CDE Diabetes Coordinator Pager: [phone]  8am-5pm

## 2017-06-30 ENCOUNTER — Inpatient Hospital Stay (HOSPITAL_COMMUNITY): Payer: BC Managed Care – PPO

## 2017-06-30 DIAGNOSIS — R634 Abnormal weight loss: Secondary | ICD-10-CM

## 2017-06-30 DIAGNOSIS — R0989 Other specified symptoms and signs involving the circulatory and respiratory systems: Secondary | ICD-10-CM

## 2017-06-30 LAB — CBC
HCT: 32.7 % — ABNORMAL LOW (ref 39.0–52.0)
Hemoglobin: 11 g/dL — ABNORMAL LOW (ref 13.0–17.0)
MCH: 27.6 pg (ref 26.0–34.0)
MCHC: 33.6 g/dL (ref 30.0–36.0)
MCV: 82 fL (ref 78.0–100.0)
Platelets: 282 10*3/uL (ref 150–400)
RBC: 3.99 MIL/uL — ABNORMAL LOW (ref 4.22–5.81)
RDW: 12.8 % (ref 11.5–15.5)
WBC: 8.6 10*3/uL (ref 4.0–10.5)

## 2017-06-30 LAB — BLOOD CULTURE ID PANEL (REFLEXED)
Acinetobacter baumannii: NOT DETECTED
Candida albicans: NOT DETECTED
Candida glabrata: NOT DETECTED
Candida krusei: NOT DETECTED
Candida parapsilosis: NOT DETECTED
Candida tropicalis: NOT DETECTED
Enterobacter cloacae complex: NOT DETECTED
Enterobacteriaceae species: NOT DETECTED
Enterococcus species: NOT DETECTED
Escherichia coli: NOT DETECTED
Haemophilus influenzae: NOT DETECTED
Klebsiella oxytoca: NOT DETECTED
Klebsiella pneumoniae: NOT DETECTED
Listeria monocytogenes: NOT DETECTED
Methicillin resistance: NOT DETECTED
Neisseria meningitidis: NOT DETECTED
Proteus species: NOT DETECTED
Pseudomonas aeruginosa: NOT DETECTED
Serratia marcescens: NOT DETECTED
Staphylococcus aureus (BCID): NOT DETECTED
Staphylococcus species: DETECTED — AB
Streptococcus agalactiae: NOT DETECTED
Streptococcus pneumoniae: NOT DETECTED
Streptococcus pyogenes: NOT DETECTED
Streptococcus species: NOT DETECTED

## 2017-06-30 LAB — BASIC METABOLIC PANEL
Anion gap: 11 (ref 5–15)
BUN: 7 mg/dL (ref 6–20)
CO2: 23 mmol/L (ref 22–32)
Calcium: 8.8 mg/dL — ABNORMAL LOW (ref 8.9–10.3)
Chloride: 98 mmol/L — ABNORMAL LOW (ref 101–111)
Creatinine, Ser: 0.38 mg/dL — ABNORMAL LOW (ref 0.61–1.24)
GFR calc Af Amer: >60 mL/min (ref >60)
GFR calc non Af Amer: >60 mL/min (ref >60)
Glucose, Bld: 231 mg/dL — ABNORMAL HIGH (ref 65–99)
Potassium: 3.6 mmol/L (ref 3.5–5.1)
Sodium: 132 mmol/L — ABNORMAL LOW (ref 135–145)

## 2017-06-30 LAB — GLUCOSE, CAPILLARY
Glucose-Capillary: 136 mg/dL — ABNORMAL HIGH (ref 65–99)
Glucose-Capillary: 148 mg/dL — ABNORMAL HIGH (ref 65–99)
Glucose-Capillary: 160 mg/dL — ABNORMAL HIGH (ref 65–99)
Glucose-Capillary: 165 mg/dL — ABNORMAL HIGH (ref 65–99)
Glucose-Capillary: 174 mg/dL — ABNORMAL HIGH (ref 65–99)
Glucose-Capillary: 197 mg/dL — ABNORMAL HIGH (ref 65–99)
Glucose-Capillary: 201 mg/dL — ABNORMAL HIGH (ref 65–99)
Glucose-Capillary: 277 mg/dL — ABNORMAL HIGH (ref 65–99)
Glucose-Capillary: 94 mg/dL (ref 65–99)
Glucose-Capillary: 126 mg/dL — ABNORMAL HIGH (ref 65–99)
Glucose-Capillary: 163 mg/dL — ABNORMAL HIGH (ref 65–99)
Glucose-Capillary: 132 mg/dL — ABNORMAL HIGH (ref 65–99)
Glucose-Capillary: 155 mg/dL — ABNORMAL HIGH (ref 65–99)
Glucose-Capillary: 211 mg/dL — ABNORMAL HIGH (ref 65–99)
Glucose-Capillary: 314 mg/dL — ABNORMAL HIGH (ref 65–99)
Glucose-Capillary: 296 mg/dL — ABNORMAL HIGH (ref 65–99)
Glucose-Capillary: 291 mg/dL — ABNORMAL HIGH (ref 65–99)

## 2017-06-30 LAB — PROCALCITONIN: Procalcitonin: 1.61 ng/mL

## 2017-06-30 LAB — TSH: TSH: 1.532 u[IU]/mL (ref 0.350–4.500)

## 2017-06-30 MED ORDER — CONTINUOUS BLOOD GLUC SENSOR MISC: 1.0000 | 0 refills | Status: AC

## 2017-06-30 MED ORDER — IOPAMIDOL (ISOVUE-300) INJECTION 61%
INTRAVENOUS | Status: AC
  Administered 2017-06-30: 75 mL
  Filled 2017-06-30: qty 100

## 2017-06-30 MED ORDER — SODIUM CHLORIDE 0.9 % IV SOLN
INTRAVENOUS | Status: DC
  Administered 2017-06-30: 75 mL/h via INTRAVENOUS

## 2017-06-30 MED ORDER — INSULIN GLARGINE 100 UNIT/ML ~~LOC~~ SOLN
20.0000 [IU] | Freq: Every day | SUBCUTANEOUS | Status: DC
  Administered 2017-07-01: 20 [IU] via SUBCUTANEOUS
  Filled 2017-06-30: qty 0.2

## 2017-06-30 NOTE — Care Management (Signed)
Referral Received to check Benefits for Diabetes Medications.     LANTUS 18 MG SQ DAILY  COVER- NOT COVER  PRIOR APPROVAL- YES # [id number]    PREFERRED:  1. TRESIBA   COVER- YES  CO-PAY- $ 30.00  TIER- 2 DRUG  PRIOR APPROVAL- NO   ALTERNATIVE:  1, TRULICITY  COVER- YES  CO-PAY- $ 30.00  TIER- 2 DRUG  PRIOR APPROVAL- NO   2 .BASAGLAR  COVER- YES  CO-PAY- $ 30.00  TIER- 2 DRUG  PRIOR APPROVAL- NO   MAIL-ORDER OR RETAIL FOR 90 DAY SUPPLY $ 90.00   PREFERRED PHARMACY : [facility]   Graves-Bigelow, Lamar LaundryBrenda Kaye, RN BSN Case Manager [phone]

## 2017-06-30 NOTE — Progress Notes (Signed)
PHARMACY - PHYSICIAN COMMUNICATION CRITICAL VALUE ALERT - BLOOD CULTURE IDENTIFICATION (BCID)  Results for orders placed or performed during the hospital encounter of 06/28/17  Blood Culture ID Panel (Reflexed) (Collected: 06/28/2017  2:00 PM)  Result Value Ref Range   Enterococcus species NOT DETECTED NOT DETECTED   Listeria monocytogenes NOT DETECTED NOT DETECTED   Staphylococcus species DETECTED (A) NOT DETECTED   Staphylococcus aureus NOT DETECTED NOT DETECTED   Methicillin resistance NOT DETECTED NOT DETECTED   Streptococcus species NOT DETECTED NOT DETECTED   Streptococcus agalactiae NOT DETECTED NOT DETECTED   Streptococcus pneumoniae NOT DETECTED NOT DETECTED   Streptococcus pyogenes NOT DETECTED NOT DETECTED   Acinetobacter baumannii NOT DETECTED NOT DETECTED   Enterobacteriaceae species NOT DETECTED NOT DETECTED   Enterobacter cloacae complex NOT DETECTED NOT DETECTED   Escherichia coli NOT DETECTED NOT DETECTED   Klebsiella oxytoca NOT DETECTED NOT DETECTED   Klebsiella pneumoniae NOT DETECTED NOT DETECTED   Proteus species NOT DETECTED NOT DETECTED   Serratia marcescens NOT DETECTED NOT DETECTED   Haemophilus influenzae NOT DETECTED NOT DETECTED   Neisseria meningitidis NOT DETECTED NOT DETECTED   Pseudomonas aeruginosa NOT DETECTED NOT DETECTED   Candida albicans NOT DETECTED NOT DETECTED   Candida glabrata NOT DETECTED NOT DETECTED   Candida krusei NOT DETECTED NOT DETECTED   Candida parapsilosis NOT DETECTED NOT DETECTED   Candida tropicalis NOT DETECTED NOT DETECTED     Name of physician (or Provider) Contacted: Hall  Changes to prescribed antibiotics required: Continue ceftriaxone and azithromycin  Sheppard CoilFrank Najia Hurlbutt PharmD., BCPS Clinical Pharmacist 06/30/2017 8:29 PM

## 2017-06-30 NOTE — Progress Notes (Addendum)
PROGRESS NOTE  Max Driver OZH:[id number] DOB: 18-Feb-1986 DOA: 06/28/2017 PCP: Talmage Coin, MD  HPI/Recap of past 24 hours:  [name] is a 32 y.o. male with medical history significant of DM who presents with flu-like illness.  Symptoms started Sunday.  He slept all day.  Monday, he went to work and his back started hurting.  Yesterday, he developed chills and SOB, fatigue.  He has not had insulin the last couple of days because he felt so bad.  +cough, nonproductive.  +SOB.  No wheezing.  Hurts all over.  He did not get a flu shot this year.  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  At presentation glucose 369 15 bicarb 20 IV fluid and insulin drip started.  Chest x-ray revealed right lower lobe pneumonia and was started on IV Rocephin and IV azithromycin.  06/29/2017: Patient seen and examined at his bedside.  Reports he feels better however still has some weakness.  His cough is minimal.  Anion Gap Has Closed and Blood Sugar Less Than 200.  Patient Started on Long-Acting Insulin.  Will Continue Insulin Drip for 2 Hours and discontinue insulin drip, if anion gap remains closed.  Denies any chest pain or dyspnea.  06/30/2017: Patient seen and examined at his bedside.  He reports night sweats overnight.  Patient also reports concern over his weight loss almost 200 pounds within the last 2 years.  CT chest done this morning reports changes consistent with right lower lobe pneumonia with associated hilar and mediastinal reactive adenopathy.  Chest x-rays recommended recommended in 3-4 weeks following trial of antibiotic therapy to ensure resolution and exclude underlying malignancy.    Assessment/Plan: Principal Problem:   DKA (diabetic ketoacidosis) (HCC) Active Problems:   CAP (community acquired pneumonia)  DKA, improving Continue the DKA protocol On insulin drip Currently transitioning with long-acting insulin Repeat BMP Continue long-acting insulin,  NovoLog and insulin sliding scale Diabetic diet Last A1c 14 (06/28/17)  Hilar adenopathy, suspect reactive rule out malignancy Right lower lobe pneumonia Hilar adenopathy noted on CT chest with contrast done on 06/30/2017 Recommend repeating imaging in 3-4 weeks to ensure resolution of adenopathy  Self-reported unintentional weight loss Reports lost almost 200 pounds within 2 years Intermittent night sweats HIV screen nonreactive Previous screening TB test negative (2016) Normal TSH (06/30/17) CT chest with contrast done on 06/30/2017 revealed hilar adenopathy which could be reactive from right lower lobe pneumonia Rule out malignancy by repeating imaging in 3-4 weeks.  Suspected spleen cysts versus hemangiomas measuring up to 2.5 cm on CT chest contrast 06/30/17 Repeat CT abd/pelvis w contrast tomorrow or later to allow contrast from today to dissipate  Right lower lobe community-acquired pneumonia present on admission Stable at this time Continue IV azithromycin IV ceftriaxone day #1 Pro-calcitonin 2.89 Leukocytosis with a WBC 14 K on presentation Leukocytosis resolving WBC 8.6 on 06/30/2017 Blood culture no growth in less than 24 hr Maintain O2 saturation greater than 92%  Hyponatremia, improving Sodium 132 from 131 Euvolemic May continue IV hydration Repeat BMP in the morning  Sinus tachycardia Heart rate between 101-112 Possibly from dehydration Continue IV fluid Monitor urine output  Acute dehydration Continue gentle IV fluid Encourage p.o. intake Monitor vital signs    Code Status: Full code  Family Communication: None at bedside  Disposition Plan: Home when clinically stable.  Possibly tomorrow 07/01/2017./2019   Consultants:  None  Procedures:  None  Antimicrobials: IV azithromycin and IV Rocephin.  DVT prophylaxis:   SCDs, sq lovenox    Objective: Vitals:   06/29/17 2349 06/30/17 0445 06/30/17 0747 06/30/17 1141  BP: 120/80 (!) 107/53  126/72 120/69  Pulse: (!) 104 98 100 (!) 103  Resp:      Temp: 99 F (37.2 C) 98.7 F (37.1 C) 99 F (37.2 C) 98.2 F (36.8 C)  TempSrc: Oral Oral Oral Other (Comment)  SpO2: 100% 99% 99% 100%  Weight:  94.6 kg (208 lb 9.6 oz)    Height:        Intake/Output Summary (Last 24 hours) at 06/30/2017 1329 Last data filed at 06/30/2017 1300 Gross per 24 hour  Intake 480 ml  Output -  Net 480 ml   Filed Weights   06/28/17 2342 06/29/17 0411 06/30/17 0445  Weight: 95.6 kg (210 lb 12.8 oz) 95.5 kg (210 lb 8.6 oz) 94.6 kg (208 lb 9.6 oz)    Exam: 06/30/2017   General: 32 year old African-American male well-developed well-nourished no acute distress.  Alert and oriented x3.  Cardiovascular: Regular rate and rhythm with no rubs or gallops.  No JVD present.   Respiratory: Clear to auscultation with no wheezes or rales.  Abdomen: Soft nontender nondistended normal bowel sounds x4 quadrant   musculoskeletal: No focal motor deficits.  Skin: Tattoos throughout.  No rashes no ulcerative lesions noted.  Psychiatry: Mood is appropriate for condition and setting.   Data Reviewed: CBC: Recent Labs  Lab 06/28/17 0200 06/30/17 1023  WBC 14.0* 8.6  NEUTROABS 10.9*  --   HGB 13.6 11.0*  HCT 40.7 32.7*  MCV 82.4 82.0  PLT 237 282   Basic Metabolic Panel: Recent Labs  Lab 06/28/17 2038 06/28/17 2342 06/29/17 0351 06/29/17 0752 06/30/17 1023  NA 129* 130* 132* 131* 132*  K 3.9 3.5 3.4* 3.7 3.6  CL 94* 96* 97* 98* 98*  CO2 23 24 26 23 23   GLUCOSE 176* 149* 151* 135* 231*  BUN 6 5* 6 6 7   CREATININE 0.71 0.69 0.65 0.62 0.38*  CALCIUM 8.7* 8.7* 8.9 8.7* 8.8*   GFR: Estimated Creatinine Clearance: 170.2 mL/min (A) (by C-G formula based on SCr of 0.38 mg/dL (L)). Liver Function Tests: Recent Labs  Lab 06/28/17 0200  AST 24  ALT 24  ALKPHOS 127*  BILITOT 1.5*  PROT 7.4  ALBUMIN 3.2*   No results for input(s): LIPASE, AMYLASE in the last 168 hours. No results for  input(s): AMMONIA in the last 168 hours. Coagulation Profile: No results for input(s): INR, PROTIME in the last 168 hours. Cardiac Enzymes: No results for input(s): CKTOTAL, CKMB, CKMBINDEX, TROPONINI in the last 168 hours. BNP (last 3 results) No results for input(s): PROBNP in the last 8760 hours. HbA1C: Recent Labs    06/28/17 1307  HGBA1C 14.0*   CBG: Recent Labs  Lab 06/30/17 0258 06/30/17 0415 06/30/17 0526 06/30/17 0736 06/30/17 1154  GLUCAP 126* 132* 155* 211* 314*   Lipid Profile: No results for input(s): CHOL, HDL, LDLCALC, TRIG, CHOLHDL, LDLDIRECT in the last 72 hours. Thyroid Function Tests: No results for input(s): TSH, T4TOTAL, FREET4, T3FREE, THYROIDAB in the last 72 hours. Anemia Panel: No results for input(s): VITAMINB12, FOLATE, FERRITIN, TIBC, IRON, RETICCTPCT in the last 72 hours. Urine analysis:    Component Value Date/Time   COLORURINE YELLOW 06/28/2017 1128   APPEARANCEUR CLEAR 06/28/2017 1128   LABSPEC 1.030 06/28/2017 1128   PHURINE 5.0 06/28/2017 1128   GLUCOSEU >=500 (A) 06/28/2017 1128   HGBUR NEGATIVE 06/28/2017 1128  BILIRUBINUR NEGATIVE 06/28/2017 1128   KETONESUR 80 (A) 06/28/2017 1128   PROTEINUR 30 (A) 06/28/2017 1128   UROBILINOGEN 0.2 11/07/2011 2241   NITRITE NEGATIVE 06/28/2017 1128   LEUKOCYTESUR NEGATIVE 06/28/2017 1128   Sepsis Labs: @LABRCNTIP (procalcitonin:4,lacticidven:4)  ) Recent Results (from the past 240 hour(s))  Culture, blood (routine x 2) Call MD if unable to obtain prior to antibiotics being given     Status: None (Preliminary result)   Collection Time: 06/28/17  2:00 PM  Result Value Ref Range Status   Specimen Description BLOOD RIGHT ANTECUBITAL  Final   Special Requests   Final    BOTTLES DRAWN AEROBIC AND ANAEROBIC Blood Culture adequate volume   Culture   Final    NO GROWTH < 24 HOURS Performed at [facility], [address], [place], [place] [address]    Report Status PENDING  Incomplete    Culture, blood (routine x 2) Call MD if unable to obtain prior to antibiotics being given     Status: None (Preliminary result)   Collection Time: 06/28/17  2:05 PM  Result Value Ref Range Status   Specimen Description BLOOD LEFT ANTECUBITAL  Final   Special Requests   Final    BOTTLES DRAWN AEROBIC AND ANAEROBIC Blood Culture adequate volume   Culture   Final    NO GROWTH < 24 HOURS Performed at [facility], [address], [place], [place] [address]    Report Status PENDING  Incomplete  Culture, respiratory (NON-Expectorated)     Status: None (Preliminary result)   Collection Time: 06/28/17 10:26 PM  Result Value Ref Range Status   Specimen Description SPUTUM  Final   Special Requests NONE  Final   Gram Stain   Final    ABUNDANT WBC PRESENT, PREDOMINANTLY PMN MODERATE GRAM POSITIVE COCCI FEW GRAM VARIABLE ROD    Culture   Final    CULTURE REINCUBATED FOR BETTER GROWTH Performed at [facility], [address], [place], [place] [address]    Report Status PENDING  Incomplete  MRSA PCR Screening     Status: None   Collection Time: 06/28/17 11:01 PM  Result Value Ref Range Status   MRSA by PCR NEGATIVE NEGATIVE Final    Comment:        The GeneXpert MRSA Assay (FDA approved for NASAL specimens only), is one component of a comprehensive MRSA colonization surveillance program. It is not intended to diagnose MRSA infection nor to guide or monitor treatment for MRSA infections. Performed at [facility], [address] [place], [place] [address]       Studies: Ct Chest W Contrast  Result Date: 06/30/2017 CLINICAL DATA:  Increasing shortness of breath with abnormal chest x-ray EXAM: CT CHEST WITH CONTRAST TECHNIQUE: Multidetector CT imaging of the chest was performed during intravenous contrast administration. CONTRAST:  75mL ISOVUE-300 IOPAMIDOL (ISOVUE-300) INJECTION 61% COMPARISON:  Plain film from 06/28/2017 FINDINGS: Cardiovascular: Thoracic aorta  and its branches are within normal limits. No cardiac enlargement is seen. The pulmonary artery as visualized is unremarkable. Mediastinum/Nodes: Thoracic inlet is within normal limits. The esophagus is unremarkable. Scattered hilar and mediastinal lymph nodes are noted. A 7.4 mm node is noted interposed between the left common carotid artery and left subclavian artery. 14 mm short axis subcarinal lymph node is noted. A 11 mm short axis right hilar lymph node is seen as well as 17 mm short axis right hilar lymph node. These are likely reactive in  nature given the changes in the right lung. Lungs/Pleura: The left lung is well aerated without focal infiltrate or sizable effusion. Right lung demonstrates diffuse lower lobe infiltrate most prominent anteriorly corresponding to that seen on the prior plain film examination consistent with pneumonia. No definitive underlying mass lesion is seen. Upper Abdomen: Visualized upper abdomen is within normal limits. Musculoskeletal: Bony structures are within normal limits. IMPRESSION: Changes consistent with right lower lobe pneumonia with associated hilar and mediastinal reactive adenopathy. Followup PA and lateral chest X-ray is recommended in 3-4 weeks following trial of antibiotic therapy to ensure resolution and exclude underlying malignancy. Electronically Signed   By: Alcide Clever M.D.   On: 06/30/2017 12:39    Scheduled Meds: . enoxaparin (LOVENOX) injection  40 mg Subcutaneous Q24H  . insulin aspart  0-15 Units Subcutaneous TID WC  . insulin aspart  0-5 Units Subcutaneous QHS  . insulin glargine  10 Units Subcutaneous Daily  . nystatin  5 mL Oral QID    Continuous Infusions: . azithromycin 500 mg (06/30/17 0542)  . cefTRIAXone (ROCEPHIN)  IV 1 g (06/30/17 0542)     LOS: 2 days     Darlin Drop, MD [facility] Pager [phone]  If 7PM-7AM, please contact night-coverage [email or web address] Password [facility] 06/30/2017, 1:29 PM

## 2017-06-30 NOTE — Progress Notes (Addendum)
Inpatient Diabetes Program   Patient has signed and been given copy of consent for CGM/[employer] [place] research study. MD also notified.  Education done regarding application and changing CGM sensor (alternate every 14 days on back of arms), 60 minute warm-up, use of glucometer/where to buy strips, how to scan CGM for glucose reading and information for PCP. Patient has been given [employer] reader and first sensor for use. Patient has also been given educational packet regarding use CGM sensor including the 1-800 toll free number for any questions, problems or needs related to the [facility] sensors or reader.  Patient given Rx for 2 sensors. For sensors with prescriptions/discharge paper work.  Sensor applied by patient to (R) Arm at (1400 pm).  Explained that glucose readings will not be available until 60 minutes after application. Patient understands that Diabetes coordinator will call them 2 times after discharge (between days 7-12 after d/c from hospital and between days 30-25 after d/c from hospital). Patient verbalizes understanding of use of Freestyle Libre CGM and was told that any issues with blood sugars/diabetes will need to be addressed by PCP, Dr. Sharl MaKerr.  Diabetes Quality of Life Survey administered to patient.   Thanks,  Christena DeemShannon Henderson Frampton RN, MSN, BC-ADM, [facility] Inpatient Diabetes Coordinator Team Pager [phone] (8a-5p)

## 2017-06-30 NOTE — Progress Notes (Signed)
Spoke with patient again about his insulin and A1C. Gave patient co-pay card for Lantus so that he can get it for free if has met his deductible.  He should be able to use this with his [employer]. Does have some Basalglar insulin at home if needed.  Discussed the A1C of 14% with patient. He states that he doesn't always check his blood sugars, but states that he takes his insulin every day.  Encouraged him to see Dr. Buddy Duty as soon as he can for follow up once discharged.  Alfonse Flavors, diabetes coordinator, discussed with Dr. Nevada Crane about entering the [place] 14-day sensor order for discharge. Will assist patient with applying the sensor before discharge and then patient will have a prescription for 2 sensors to follow.  Harvel Ricks RN BSN CDE Diabetes Coordinator Pager: [phone]  8am-5pm

## 2017-07-01 DIAGNOSIS — E1010 Type 1 diabetes mellitus with ketoacidosis without coma: Secondary | ICD-10-CM

## 2017-07-01 LAB — CULTURE, RESPIRATORY W GRAM STAIN: Culture: NORMAL

## 2017-07-01 LAB — BASIC METABOLIC PANEL
Anion gap: 10 (ref 5–15)
BUN: 6 mg/dL (ref 6–20)
CO2: 26 mmol/L (ref 22–32)
Calcium: 9 mg/dL (ref 8.9–10.3)
Chloride: 98 mmol/L — ABNORMAL LOW (ref 101–111)
Creatinine, Ser: 0.64 mg/dL (ref 0.61–1.24)
GFR calc Af Amer: >60 mL/min (ref >60)
GFR calc non Af Amer: >60 mL/min (ref >60)
Glucose, Bld: 339 mg/dL — ABNORMAL HIGH (ref 65–99)
Potassium: 3.8 mmol/L (ref 3.5–5.1)
Sodium: 134 mmol/L — ABNORMAL LOW (ref 135–145)

## 2017-07-01 LAB — CBC
HCT: 35.3 % — ABNORMAL LOW (ref 39.0–52.0)
Hemoglobin: 11.5 g/dL — ABNORMAL LOW (ref 13.0–17.0)
MCH: 27.1 pg (ref 26.0–34.0)
MCHC: 32.6 g/dL (ref 30.0–36.0)
MCV: 83.3 fL (ref 78.0–100.0)
Platelets: 357 10*3/uL (ref 150–400)
RBC: 4.24 MIL/uL (ref 4.22–5.81)
RDW: 13.2 % (ref 11.5–15.5)
WBC: 6.4 10*3/uL (ref 4.0–10.5)

## 2017-07-01 LAB — GLUCOSE, CAPILLARY
Glucose-Capillary: 224 mg/dL — ABNORMAL HIGH (ref 65–99)
Glucose-Capillary: 290 mg/dL — ABNORMAL HIGH (ref 65–99)
Glucose-Capillary: 296 mg/dL — ABNORMAL HIGH (ref 65–99)
Glucose-Capillary: 321 mg/dL — ABNORMAL HIGH (ref 65–99)
Glucose-Capillary: 337 mg/dL — ABNORMAL HIGH (ref 65–99)

## 2017-07-01 MED ORDER — AZITHROMYCIN 250 MG PO TABS: 500.0000 mg | ORAL_TABLET | Freq: Every day | ORAL | Status: DC

## 2017-07-01 MED ORDER — CEFUROXIME AXETIL 500 MG PO TABS: 500.0000 mg | ORAL_TABLET | Freq: Two times a day (BID) | ORAL | 0 refills | Status: AC

## 2017-07-01 MED ORDER — BASAGLAR KWIKPEN 100 UNIT/ML ~~LOC~~ SOPN: 24.0000 [IU] | PEN_INJECTOR | Freq: Every day | SUBCUTANEOUS | 1 refills | Status: DC

## 2017-07-01 MED ORDER — INSULIN ASPART 100 UNIT/ML ~~LOC~~ SOLN: 0.0000 [IU] | Freq: Three times a day (TID) | SUBCUTANEOUS | 1 refills | Status: DC

## 2017-07-01 NOTE — Progress Notes (Signed)
   July 01, 2017  To Whom it may concern,  [name] and remained under my care in the hospital through 07/01/2017.  He has been advised that he should not return to work until 07/06/2017.  Sincerely,     Lonia BloodJeffrey T. Siani Utke, MD [facility] Office  [phone]

## 2017-07-01 NOTE — Discharge Instructions (Signed)
It is VERY IMPORTANT that you have a repeat CAT SCAN of your chest and abdomen in 3-4 weeks to assure your Pneumonia has resolved and that the enlarged lymph nodes in your chest and the probable cysts in your spleen are stable or resolved.      Diabetic Ketoacidosis Diabetic ketoacidosis is a life-threatening complication of diabetes. If it is not treated, it can cause severe dehydration and organ damage and can lead to a coma or death. What are the causes? This condition develops when there is not enough of the hormone insulin in the body. Insulin helps the body to break down sugar for energy. Without insulin, the body cannot break down sugar, so it breaks down fats instead. This leads to the production of acids that are called ketones. Ketones are poisonous at high levels. This condition can be triggered by:  Stress on the body that is brought on by an illness.  Medicines that raise blood glucose levels.  Not taking diabetes medicine.  What are the signs or symptoms? Symptoms of this condition include:  Fatigue.  Weight loss.  Excessive thirst.  Light-headedness.  Fruity or sweet-smelling breath.  Excessive urination.  Vision changes.  Confusion or irritability.  Nausea.  Vomiting.  Rapid breathing.  Abdominal pain.  Feeling flushed.  How is this diagnosed? This condition is diagnosed based on a medical history, a physical exam, and blood tests. You may also have a urine test that checks for ketones. How is this treated? This condition may be treated with:  Fluid replacement. This may be done to correct dehydration.  Insulin injections. These may be given through the skin or through an IV tube.  Electrolyte replacement. Electrolytes, such as potassium and sodium, may be given in pill form or through an IV tube.  Antibiotic medicines. These may be prescribed if your condition was caused by an infection.  Follow these instructions at home: Eating and  drinking  Drink enough fluids to keep your urine clear or pale yellow.  If you cannot eat, alternate between drinking fluids with sugar (such as juice) and salty fluids (such as broth or bouillon).  If you can eat, follow your usual diet and drink sugar-free liquids, such as water. Other Instructions   Take insulin as directed by your health care provider. Do not skip insulin injections. Do not use expired insulin.  If your blood sugar is over 240 mg/dL, monitor your urine ketones every 4-6 hours.  If you were prescribed an antibiotic medicine, finish all of it even if you start to feel better.  Rest and exercise only as directed by your health care provider.  If you get sick, call your health care provider and begin treatment quickly. Your body often needs extra insulin to fight an illness.  Check your blood glucose levels regularly. If your blood glucose is high, drink plenty of fluids. This helps to flush out ketones. Contact a health care provider if:  Your blood glucose level is too high or too low.  You have ketones in your urine.  You have a fever.  You cannot eat.  You cannot tolerate fluids.  You have been vomiting for more than 2 hours.  You continue to have symptoms of this condition.  You develop new symptoms. Get help right away if:  Your blood glucose levels continue to be high (elevated).  Your monitor reads high even when you are taking insulin.  You faint.  You have chest pain.  You have trouble breathing.  You have a sudden, severe headache.  You have sudden weakness in one arm or one leg.  You have sudden trouble speaking or swallowing.  You have vomiting or diarrhea that gets worse after 3 hours.  You feel severely fatigued.  You have trouble thinking.  You have abdominal pain.  You are severely dehydrated. Symptoms of severe dehydration include: ? Extreme thirst. ? Dry mouth. ? Blue lips. ? Cold hands and feet. ? Rapid  breathing. This information is not intended to replace advice given to you by your health care provider. Make sure you discuss any questions you have with your health care provider. Document Released: 03/18/2000 Document Revised: 08/27/2015 Document Reviewed: 02/26/2014 Elsevier Interactive Patient Education  2017 Elsevier Inc.   Community-Acquired Pneumonia, Adult Pneumonia is an infection of the lungs. One type of pneumonia can happen while a person is in a hospital. A different type can happen when a person is not in a hospital (community-acquired pneumonia). It is easy for this kind to spread from person to person. It can spread to you if you breathe near an infected person who coughs or sneezes. Some symptoms include:  A dry cough.  A wet (productive) cough.  Fever.  Sweating.  Chest pain.  Follow these instructions at home:  Take over-the-counter and prescription medicines only as told by your doctor. ? Only take cough medicine if you are losing sleep. ? If you were prescribed an antibiotic medicine, take it as told by your doctor. Do not stop taking the antibiotic even if you start to feel better.  Sleep with your head and neck raised (elevated). You can do this by putting a few pillows under your head, or you can sleep in a recliner.  Do not use tobacco products. These include cigarettes, chewing tobacco, and e-cigarettes. If you need help quitting, ask your doctor.  Drink enough water to keep your pee (urine) clear or pale yellow. A shot (vaccine) can help prevent pneumonia. Shots are often suggested for:  People older than 31 years of age.  People older than 31 years of age: ? Who are having cancer treatment. ? Who have long-term (chronic) lung disease. ? Who have problems with their body's defense system (immune system).  You may also prevent pneumonia if you take these actions:  Get the flu (influenza) shot every year.  Go to the dentist as often as  told.  Wash your hands often. If soap and water are not available, use hand sanitizer.  Contact a doctor if:  You have a fever.  You lose sleep because your cough medicine does not help. Get help right away if:  You are short of breath and it gets worse.  You have more chest pain.  Your sickness gets worse. This is very serious if: ? You are an older adult. ? Your body's defense system is weak.  You cough up blood. This information is not intended to replace advice given to you by your health care provider. Make sure you discuss any questions you have with your health care provider. Document Released: 09/07/2007 Document Revised: 08/27/2015 Document Reviewed: 07/16/2014 Elsevier Interactive Patient Education  [employer].

## 2017-07-01 NOTE — Discharge Summary (Signed)
DISCHARGE SUMMARY  [name] Nygaard  MR#: [id number]  DOB:May 01, 1985  Date of Admission: 06/28/2017 Date of Discharge: 07/01/2017  Attending Physician:Harvir Patry Silvestre Gunner Yentl Verge, MD   Patient's HQI:ONGEPCP:Kerr, Tinnie GensJeffrey, MD  Consults:  none  Disposition: D/C home   Follow-up Appts: Follow-up Information    Talmage CoinKerr, Shikita Vaillancourt, MD Follow up in 1 week(s).   Specialty:  Endocrinology Contact information: [address] [place] [address] [phone]           Tests Needing Follow-up: -repeat CT chest in 3-4 weeks to ensure resolution of adenopathy -CT of the abdom is suggested in 3-4 weeks to allow evaluation of suspected spleen cysts versus hemangiomas -further titration of insulin dosing will be necessary as outpt   Discharge Diagnoses: DKA Hilar adenopathy, suspect reactive  Right lower lobe pneumonia Hilar adenopathy noted on CT chest with contrast done on 06/30/2017 Self-reported unintentional weight loss Suspected spleen cysts versus hemangiomas Acute dehydration  Initial presentation: 32 y.o.malewith a history ofDM who presented withchills, SOB, and fatigue. He had not taken his insulin for a couple of days "because he felt so bad."  At presentation his glucose was 369, w/ a bicarb of 15 and an AG of 19.  Chest x-ray revealed right lower lobe pneumonia.  Hospital Course:  DKA Due to acute illness and lack of insulin dosing - pt educated on DM care while sick - resume basalglar for home use - DM educators saw pt during stay - further titration of insulin dosing will be necessary as outpt - A1c 14 on 06/28/17  Hilar adenopathy suspect reactive due to Right lower lobe pneumonia - noted on CT chest with contrast done on 06/30/2017 - rcommend repeating imaging in 3-4 weeks to ensure resolution of adenopathy w/ resolution of PNA   Self-reported unintentional weight loss almost 200 pounds within 2 years per his hx - HIV screen nonreactive - Normal TSH (06/30/17) -  may be due to untreated DM   Suspected spleen cysts versus hemangiomas measuring up to 2.5 cm on CT chest contrast 06/30/17 - re-evaluate at time of f/u CT chest   Right lower lobe community-acquired pneumonia Clinically stable - transition to oral abx at time of d/c - sats >90% on RA   Allergies as of 07/01/2017      Reactions   Metformin And Related       Medication List    STOP taking these medications   insulin glargine 100 UNIT/ML injection Commonly known as:  LANTUS Replaced by:  Elon JesterBASAGLAR KWIKPEN 100 UNIT/ML Sopn     TAKE these medications   BASAGLAR KWIKPEN 100 UNIT/ML Sopn Inject 0.24 mLs (24 Units total) into the skin at bedtime. Replaces:  insulin glargine 100 UNIT/ML injection   cefUROXime 500 MG tablet Commonly known as:  CEFTIN Take 1 tablet (500 mg total) by mouth 2 (two) times daily for 10 doses.   Continuous Blood Gluc Sensor Misc 1 each by Does not apply route as directed. Use as directed every 14 days. May dispense FreeStyle [employer] Sensor System or similar.   insulin aspart 100 UNIT/ML injection Commonly known as:  novoLOG Inject 0-15 Units into the skin 3 (three) times daily with meals.       Day of Discharge BP 112/64 (BP Location: Right Arm)   Pulse 94   Temp 98 F (36.7 C) (Oral)   Resp 18   Ht 6\' 5"  (1.956 m)   Wt 96.1 kg (211 lb 14.4 oz)   SpO2 99%  BMI 25.13 kg/m   Physical Exam: General: No acute respiratory distress Lungs: Clear to auscultation bilaterally without wheezes or crackles Cardiovascular: Regular rate and rhythm without murmur gallop or rub normal S1 and S2 Abdomen: Nontender, nondistended, soft, bowel sounds positive, no rebound, no ascites, no appreciable mass Extremities: No significant cyanosis, clubbing, or edema bilateral lower extremities  Basic Metabolic Panel: Recent Labs  Lab 06/28/17 2342 06/29/17 0351 06/29/17 0752 06/30/17 1023 07/01/17 0408  NA 130* 132* 131* 132* 134*  K 3.5 3.4* 3.7 3.6 3.8  CL  96* 97* 98* 98* 98*  CO2 24 26 23 23 26   GLUCOSE 149* 151* 135* 231* 339*  BUN 5* 6 6 7 6   CREATININE 0.69 0.65 0.62 0.38* 0.64  CALCIUM 8.7* 8.9 8.7* 8.8* 9.0    Liver Function Tests: Recent Labs  Lab 06/28/17 0200  AST 24  ALT 24  ALKPHOS 127*  BILITOT 1.5*  PROT 7.4  ALBUMIN 3.2*    CBC: Recent Labs  Lab 06/28/17 0200 06/30/17 1023 07/01/17 0408  WBC 14.0* 8.6 6.4  NEUTROABS 10.9*  --   --   HGB 13.6 11.0* 11.5*  HCT 40.7 32.7* 35.3*  MCV 82.4 82.0 83.3  PLT 237 282 357    CBG: Recent Labs  Lab 06/30/17 2007 07/01/17 0017 07/01/17 0416 07/01/17 0810 07/01/17 1136  GLUCAP 291* 290* 337* 321* 224*    Recent Results (from the past 240 hour(s))  Culture, blood (routine x 2) Call MD if unable to obtain prior to antibiotics being given     Status: Abnormal (Preliminary result)   Collection Time: 06/28/17  2:00 PM  Result Value Ref Range Status   Specimen Description BLOOD RIGHT ANTECUBITAL  Final   Special Requests   Final    BOTTLES DRAWN AEROBIC AND ANAEROBIC Blood Culture adequate volume   Culture  Setup Time   Final    GRAM POSITIVE COCCI IN CLUSTERS AEROBIC BOTTLE ONLY CRITICAL RESULT CALLED TO, READ BACK BY AND VERIFIED WITH: Kelby Fam PharmD 16:25 06/30/17 ([email or web address])    Culture (A)  Final    STAPHYLOCOCCUS SPECIES (COAGULASE NEGATIVE) THE SIGNIFICANCE OF ISOLATING THIS ORGANISM FROM A SINGLE SET OF BLOOD CULTURES WHEN MULTIPLE SETS ARE DRAWN IS UNCERTAIN. PLEASE NOTIFY THE MICROBIOLOGY DEPARTMENT WITHIN ONE WEEK IF SPECIATION AND SENSITIVITIES ARE REQUIRED. Performed at [facility], [address], [place], [place] [address]    Report Status PENDING  Incomplete  Blood Culture ID Panel (Reflexed)     Status: Abnormal   Collection Time: 06/28/17  2:00 PM  Result Value Ref Range Status   Enterococcus species NOT DETECTED NOT DETECTED Final   Listeria monocytogenes NOT DETECTED NOT DETECTED Final   Staphylococcus species DETECTED (A) NOT  DETECTED Final    Comment: Methicillin (oxacillin) susceptible coagulase negative staphylococcus. Possible blood culture contaminant (unless isolated from more than one blood culture draw or clinical case suggests pathogenicity). No antibiotic treatment is indicated for blood  culture contaminants. CRITICAL RESULT CALLED TO, READ BACK BY AND VERIFIED WITH: Kelby Fam PharmD 16:25 06/30/17 ([email or web address])    Staphylococcus aureus NOT DETECTED NOT DETECTED Final   Methicillin resistance NOT DETECTED NOT DETECTED Final   Streptococcus species NOT DETECTED NOT DETECTED Final   Streptococcus agalactiae NOT DETECTED NOT DETECTED Final   Streptococcus pneumoniae NOT DETECTED NOT DETECTED Final   Streptococcus pyogenes NOT DETECTED NOT DETECTED Final   Acinetobacter baumannii NOT DETECTED NOT DETECTED Final   Enterobacteriaceae species NOT DETECTED NOT DETECTED Final  Enterobacter cloacae complex NOT DETECTED NOT DETECTED Final   Escherichia coli NOT DETECTED NOT DETECTED Final   Klebsiella oxytoca NOT DETECTED NOT DETECTED Final   Klebsiella pneumoniae NOT DETECTED NOT DETECTED Final   Proteus species NOT DETECTED NOT DETECTED Final   Serratia marcescens NOT DETECTED NOT DETECTED Final   Haemophilus influenzae NOT DETECTED NOT DETECTED Final   Neisseria meningitidis NOT DETECTED NOT DETECTED Final   Pseudomonas aeruginosa NOT DETECTED NOT DETECTED Final   Candida albicans NOT DETECTED NOT DETECTED Final   Candida glabrata NOT DETECTED NOT DETECTED Final   Candida krusei NOT DETECTED NOT DETECTED Final   Candida parapsilosis NOT DETECTED NOT DETECTED Final   Candida tropicalis NOT DETECTED NOT DETECTED Final    Comment: Performed at [facility], [address], [place], [place] [address]  Culture, blood (routine x 2) Call MD if unable to obtain prior to antibiotics being given     Status: None (Preliminary result)   Collection Time: 06/28/17  2:05 PM  Result Value Ref Range Status   Specimen  Description BLOOD LEFT ANTECUBITAL  Final   Special Requests   Final    BOTTLES DRAWN AEROBIC AND ANAEROBIC Blood Culture adequate volume   Culture   Final    NO GROWTH 3 DAYS Performed at [facility], [address], [place], [place] [address]    Report Status PENDING  Incomplete  Culture, respiratory (NON-Expectorated)     Status: None   Collection Time: 06/28/17 10:26 PM  Result Value Ref Range Status   Specimen Description SPUTUM  Final   Special Requests NONE  Final   Gram Stain   Final    ABUNDANT WBC PRESENT, PREDOMINANTLY PMN MODERATE GRAM POSITIVE COCCI FEW GRAM VARIABLE ROD    Culture   Final    FEW Consistent with normal respiratory flora. Performed at [facility], [address] [place], [place] [address]    Report Status 07/01/2017 FINAL  Final  MRSA PCR Screening     Status: None   Collection Time: 06/28/17 11:01 PM  Result Value Ref Range Status   MRSA by PCR NEGATIVE NEGATIVE Final    Comment:        The GeneXpert MRSA Assay (FDA approved for NASAL specimens only), is one component of a comprehensive MRSA colonization surveillance program. It is not intended to diagnose MRSA infection nor to guide or monitor treatment for MRSA infections. Performed at [facility], [address] [place], [place] [address]      Time spent in discharge (includes decision making & examination of pt): 35 minutes  07/01/2017, 4:26 PM   Lonia Blood, MD [facility] Office  [phone] Pager [phone]  On-Call/Text Page:      [email or web address]      password [facility]

## 2017-07-01 NOTE — Progress Notes (Signed)
PHARMACIST - PHYSICIAN COMMUNICATION  CONCERNING: Antibiotic IV to Oral Route Change Policy  RECOMMENDATION: This patient is receiving Azithromycin by the intravenous route.  Based on criteria approved by the Pharmacy and Therapeutics Committee, the antibiotic(s) is/are being converted to the equivalent oral dose form(s).   DESCRIPTION:  These criteria include:  Patient being treated for a respiratory tract infection, urinary tract infection, cellulitis or clostridium difficile associated diarrhea if on metronidazole  The patient is not neutropenic and does not exhibit a GI malabsorption state  The patient is eating (either orally or via tube) and/or has been taking other orally administered medications for a least 24 hours  The patient is improving clinically and has a Tmax < 100.5  If you have questions about this conversion, please contact the Pharmacy Department  []  ( [phone] )  [place] []  ( [phone] )  [facility] [x]  ( [phone] )  [place] []  ( [phone] )  [facility] []  ( [phone] )  [facility]    Nilay Mangrum, PharmD Pharmacy Resident Pager: [phone]  

## 2017-07-02 LAB — CULTURE, BLOOD (ROUTINE X 2): Special Requests: ADEQUATE

## 2017-07-03 LAB — CULTURE, BLOOD (ROUTINE X 2)
Culture: NO GROWTH
Special Requests: ADEQUATE

## 2017-07-06 ENCOUNTER — Other Ambulatory Visit: Payer: Self-pay | Admitting: Internal Medicine

## 2017-07-06 DIAGNOSIS — R591 Generalized enlarged lymph nodes: Secondary | ICD-10-CM

## 2017-07-06 DIAGNOSIS — R9389 Abnormal findings on diagnostic imaging of other specified body structures: Secondary | ICD-10-CM

## 2017-07-06 DIAGNOSIS — R935 Abnormal findings on diagnostic imaging of other abdominal regions, including retroperitoneum: Secondary | ICD-10-CM

## 2017-07-27 ENCOUNTER — Ambulatory Visit
Admission: RE | Admit: 2017-07-27 | Discharge: 2017-07-27 | Disposition: A | Discharge status: Home / Self Care | Payer: BC Managed Care – PPO | Source: Ambulatory Visit | Attending: Internal Medicine | Admitting: Internal Medicine

## 2017-07-27 DIAGNOSIS — R591 Generalized enlarged lymph nodes: Secondary | ICD-10-CM

## 2017-07-27 DIAGNOSIS — R935 Abnormal findings on diagnostic imaging of other abdominal regions, including retroperitoneum: Secondary | ICD-10-CM

## 2017-07-27 DIAGNOSIS — R9389 Abnormal findings on diagnostic imaging of other specified body structures: Secondary | ICD-10-CM

## 2017-07-27 MED ORDER — IOPAMIDOL (ISOVUE-300) INJECTION 61%
125.0000 mL | Freq: Once | INTRAVENOUS | Status: AC | PRN
  Administered 2017-07-27: 125 mL via INTRAVENOUS

## 2017-08-11 ENCOUNTER — Other Ambulatory Visit: Payer: Self-pay | Admitting: Internal Medicine

## 2017-08-11 DIAGNOSIS — R935 Abnormal findings on diagnostic imaging of other abdominal regions, including retroperitoneum: Secondary | ICD-10-CM

## 2018-02-02 ENCOUNTER — Encounter (HOSPITAL_COMMUNITY): Payer: Self-pay | Admitting: Emergency Medicine

## 2018-02-02 ENCOUNTER — Emergency Department (HOSPITAL_COMMUNITY)
Admission: EM | Admit: 2018-02-02 | Discharge: 2018-02-02 | Disposition: A | Discharge status: Home / Self Care | Payer: BC Managed Care – PPO | Attending: Emergency Medicine | Admitting: Emergency Medicine

## 2018-02-02 DIAGNOSIS — Z794 Long term (current) use of insulin: Secondary | ICD-10-CM | POA: Insufficient documentation

## 2018-02-02 DIAGNOSIS — E13649 Other specified diabetes mellitus with hypoglycemia without coma: Secondary | ICD-10-CM | POA: Insufficient documentation

## 2018-02-02 DIAGNOSIS — Z79899 Other long term (current) drug therapy: Secondary | ICD-10-CM | POA: Diagnosis not present

## 2018-02-02 DIAGNOSIS — E162 Hypoglycemia, unspecified: Secondary | ICD-10-CM

## 2018-02-02 LAB — BASIC METABOLIC PANEL
Anion gap: 9 (ref 5–15)
BUN: 12 mg/dL (ref 6–20)
CO2: 25 mmol/L (ref 22–32)
Calcium: 9.8 mg/dL (ref 8.9–10.3)
Chloride: 104 mmol/L (ref 98–111)
Creatinine, Ser: 0.7 mg/dL (ref 0.61–1.24)
GFR calc Af Amer: >60 mL/min (ref >60)
GFR calc non Af Amer: >60 mL/min (ref >60)
Glucose, Bld: 262 mg/dL — ABNORMAL HIGH (ref 70–99)
Potassium: 4.7 mmol/L (ref 3.5–5.1)
Sodium: 138 mmol/L (ref 135–145)

## 2018-02-02 LAB — URINALYSIS, ROUTINE W REFLEX MICROSCOPIC
Bilirubin Urine: NEGATIVE
Glucose, UA: >=500 mg/dL — AB
Hgb urine dipstick: NEGATIVE
Ketones, ur: NEGATIVE mg/dL
Leukocytes, UA: NEGATIVE
Nitrite: NEGATIVE
Protein, ur: NEGATIVE mg/dL
Specific Gravity, Urine: 1.027 (ref 1.005–1.030)
pH: 5 (ref 5.0–8.0)

## 2018-02-02 LAB — CBC
HCT: 43.7 % (ref 39.0–52.0)
Hemoglobin: 13.1 g/dL (ref 13.0–17.0)
MCH: 26.3 pg (ref 26.0–34.0)
MCHC: 30 g/dL (ref 30.0–36.0)
MCV: 87.6 fL (ref 80.0–100.0)
Platelets: 296 10*3/uL (ref 150–400)
RBC: 4.99 MIL/uL (ref 4.22–5.81)
RDW: 11.9 % (ref 11.5–15.5)
WBC: 4.7 10*3/uL (ref 4.0–10.5)
nRBC: 0 % (ref 0.0–0.2)

## 2018-02-02 LAB — CBG MONITORING, ED
Glucose-Capillary: 255 mg/dL — ABNORMAL HIGH (ref 70–99)
Glucose-Capillary: 155 mg/dL — ABNORMAL HIGH (ref 70–99)
Glucose-Capillary: 156 mg/dL — ABNORMAL HIGH (ref 70–99)

## 2018-02-02 NOTE — ED Triage Notes (Signed)
Pt erports low blood sugar today. Was traveling to work when he took his blood sugar and the lowest reading was 50. He drank orange juice/soda/. EMS gave him 2 glucose tabs and more orange juice. He reports LOC. Refused EMS transport. The patient is alert and oriented x4 with no acute distress at triage. CBG at triage 255.

## 2018-02-02 NOTE — ED Provider Notes (Signed)
[name] EMERGENCY DEPARTMENT Provider Note   CSN: [id number] Arrival date & time: 02/02/18  1048     History   Chief Complaint Chief Complaint  Patient presents with  . Hypoglycemia  . Loss of Consciousness    HPI [name] is a 32 y.o. 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 He presents for hypoglycemic event. The paitent states that he awoke from sleep around 3:45 this morning covered in sweat.  He checked his blood sugar and noted that it was in the 50s so he got up and ate a plate of ribs, greens and a sandwich.  He went back to sleep and then woke up to get ready for his job.  He took his blood sugar and states it was about 130.  He drank a cup of cranberry juice and took 38 units of insulin.  He uses Basaglar insulin for coverage.  The patient states that on his way to work he began feeling a bit dizzy.  He pulled over to take his blood sugar and noticed that it was about 70 so he drank some orange juice at a gas station and ate a bag of chips.  As he was traveling he became more woozy.  So when he got to school he sat down on a bench.  The children asking him if he is okay and he was given 2 and a Snickers bar.  He ate both of those and checked his blood sugar it was now in the 40s so they called 911.  The patient lost consciousness.  As EMS was arriving he was coming back around and his blood sugar was still noted to be in the 40s.  He was given a glass of orange juice and 2 Glucerna tablets along with stick of peanut butter.  He refused transport at that time but called his girlfriend who brought him here to [place].  Patient denies any recent fevers chills or other constitutional symptoms.  He denies any foot wounds, urinary symptoms.  He states that his blood sugar has been under very good control averaging at about 120.  HPI  Past Medical History:  Diagnosis Date  . Diabetes mellitus     Patient Active Problem List   Diagnosis Date Noted  . CAP (community acquired  pneumonia) 06/28/2017  . 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 Hyperglycemia without ketosis 01/19/2013    History reviewed. No pertinent surgical history.      Home Medications    Prior to Admission medications   Medication Sig Start Date End Date Taking? Authorizing Provider  cholecalciferol (VITAMIN D) 1000 units tablet Take 1,000 Units by mouth daily.   Yes [provider]  insulin aspart (NOVOLOG) 100 UNIT/ML injection Inject 0-15 Units into the skin 3 (three) times daily with meals. 07/01/17  Yes Lonia Blood, MD  Insulin Glargine (BASAGLAR KWIKPEN) 100 UNIT/ML SOPN Inject 0.24 mLs (24 Units total) into the skin at bedtime. Patient taking differently: Inject 40 Units into the skin every morning.  07/01/17  Yes Lonia Blood, MD  Omega-3 Fatty Acids (FISH OIL) 1000 MG CAPS Take 1 capsule by mouth daily.   Yes [provider]  Continuous Blood Gluc Sensor MISC 1 each by Does not apply route as directed. Use as directed every 14 days. May dispense FreeStyle [employer] or similar. 06/30/17   Darlin Drop, DO    Family History Family History  Problem Relation Age of  Onset  . Hypertension Other   . Diabetes Other     Social History Social History   Tobacco Use  . Smoking status: Never Smoker  . Smokeless tobacco: Never Used  Substance Use Topics  . Alcohol use: No  . Drug use: No     Allergies   Metformin and related   Review of Systems Review of Systems  Ten systems reviewed and are negative for acute change, except as noted in the HPI.   Physical Exam Updated Vital Signs BP 133/83 (BP Location: Left Arm)   Pulse 85   Temp (!) 97.4 F (36.3 C) (Oral)   Resp 16   Ht 6\' 5"  (1.956 m)   Wt 99.8 kg   SpO2 100%   BMI 26.09 kg/m   Physical Exam  Constitutional: He is oriented to person, place, and time. He appears well-developed and well-nourished. No distress.  HENT:  Head: Normocephalic  and atraumatic.  Eyes: Conjunctivae are normal. No scleral icterus.  Neck: Normal range of motion. Neck supple.  Cardiovascular: Normal rate, regular rhythm and normal heart sounds.  Pulmonary/Chest: Effort normal and breath sounds normal. No respiratory distress.  Abdominal: Soft. There is no tenderness.  Musculoskeletal: Normal range of motion. He exhibits no edema.  Neurological: He is alert and oriented to person, place, and time.  Skin: Skin is warm and dry. No rash noted. He is not diaphoretic.  No wounds  Psychiatric: His behavior is normal.  Nursing note and vitals reviewed.    ED Treatments / Results  Labs (all labs ordered are listed, but only abnormal results are displayed) Labs Reviewed  BASIC METABOLIC PANEL - Abnormal; Notable for the following components:      Result Value   Glucose, Bld 262 (*)    All other components within normal limits  URINALYSIS, ROUTINE W REFLEX MICROSCOPIC - Abnormal; Notable for the following components:   Glucose, UA >=500 (*)    Bacteria, UA RARE (*)    All other components within normal limits  CBG MONITORING, ED - Abnormal; Notable for the following components:   Glucose-Capillary 255 (*)    All other components within normal limits  CBG MONITORING, ED - Abnormal; Notable for the following components:   Glucose-Capillary 155 (*)    All other components within normal limits  CBC  CBG MONITORING, ED    EKG EKG Interpretation  Date/Time:  Friday February 02 2018 11:27:07 EDT Ventricular Rate:  85 PR Interval:  162 QRS Duration: 84 QT Interval:  352 QTC Calculation: 418 R Axis:   66 Text Interpretation:  Normal sinus rhythm Normal ECG Confirmed by Tilden Fossa [phone]) on 02/02/2018 1:11:52 PM   Radiology No results found.  Procedures Procedures (including critical care time)  Medications Ordered in ED Medications - No data to display   Initial Impression / Assessment and Plan / ED Course  I have reviewed the  triage vital signs and the nursing notes.  Pertinent labs & imaging results that were available during my care of the patient were reviewed by me and considered in my medical decision making (see chart for details).     Patient 32 year old male with hypoglycemic event after taking long-acting insulin.  He has a history of Mody.  Patient blood sugar has leveled off at 150 after 2 hours.  He is having no further decline in his blood sugars.  I discussed the case with Dr. Madilyn Hook.  We will have the patient take 20 units of his Basaglar  over the weekend.  He is to follow-up with his endocrinologist Monday for appropriate dosing.  He appears otherwise ready for discharge. Final Clinical Impressions(s) / ED Diagnoses   Final diagnoses:  Hypoglycemia    ED Discharge Orders    None       Arthor Captain, PA-C 02/02/18 1607    Tilden Fossa, MD 02/04/18 [phone]

## 2018-02-02 NOTE — Discharge Instructions (Addendum)
Take only 20 units of basal insulin this weekend and call your endocrinologist for dose management on Monday. It will be much better for you to have elevated blood sugar than low blood sugar. Make sure that you are with someone else at all times over the next 48 hours in case you have another episode of hypoglycemia.   Contact a health care provider if: You have problems keeping your blood glucose in your target range. You have frequent episodes of hypoglycemia. Get help right away if: You continue to have hypoglycemia symptoms after eating or drinking something containing glucose. Your blood glucose is at or below 54 mg/dL (3 mmol/L). You have a seizure. You faint.

## 2018-02-05 ENCOUNTER — Ambulatory Visit
Admission: RE | Admit: 2018-02-05 | Discharge: 2018-02-05 | Disposition: A | Discharge status: Home / Self Care | Payer: BC Managed Care – PPO | Source: Ambulatory Visit | Attending: Internal Medicine | Admitting: Internal Medicine

## 2018-02-05 DIAGNOSIS — R935 Abnormal findings on diagnostic imaging of other abdominal regions, including retroperitoneum: Secondary | ICD-10-CM

## 2018-02-18 ENCOUNTER — Ambulatory Visit
Admission: RE | Admit: 2018-02-18 | Discharge: 2018-02-18 | Disposition: A | Discharge status: Home / Self Care | Payer: BC Managed Care – PPO | Source: Ambulatory Visit | Attending: Internal Medicine | Admitting: Internal Medicine

## 2018-02-18 MED ORDER — GADOBENATE DIMEGLUMINE 529 MG/ML IV SOLN
20.0000 mL | Freq: Once | INTRAVENOUS | Status: AC | PRN
  Administered 2018-02-18: 20 mL via INTRAVENOUS

## 2018-04-30 ENCOUNTER — Ambulatory Visit: Payer: BC Managed Care – PPO | Admitting: Psychology

## 2018-04-30 DIAGNOSIS — F4322 Adjustment disorder with anxiety: Secondary | ICD-10-CM | POA: Diagnosis not present

## 2018-05-11 ENCOUNTER — Ambulatory Visit: Payer: BC Managed Care – PPO | Admitting: Psychology

## 2018-05-11 DIAGNOSIS — F4322 Adjustment disorder with anxiety: Secondary | ICD-10-CM | POA: Diagnosis not present

## 2018-05-15 ENCOUNTER — Ambulatory Visit: Payer: BC Managed Care – PPO | Admitting: Psychology

## 2018-05-31 ENCOUNTER — Ambulatory Visit: Payer: BC Managed Care – PPO | Admitting: Psychology

## 2018-05-31 DIAGNOSIS — F4322 Adjustment disorder with anxiety: Secondary | ICD-10-CM

## 2018-06-01 ENCOUNTER — Ambulatory Visit: Payer: BC Managed Care – PPO | Admitting: Psychology

## 2018-06-13 ENCOUNTER — Ambulatory Visit: Payer: BC Managed Care – PPO | Admitting: Psychology

## 2018-06-13 ENCOUNTER — Other Ambulatory Visit: Payer: Self-pay

## 2018-06-13 DIAGNOSIS — F4322 Adjustment disorder with anxiety: Secondary | ICD-10-CM | POA: Diagnosis not present

## 2018-06-28 ENCOUNTER — Ambulatory Visit: Payer: BC Managed Care – PPO | Admitting: Psychology

## 2018-07-17 ENCOUNTER — Ambulatory Visit: Payer: BC Managed Care – PPO | Admitting: Psychology

## 2018-07-18 ENCOUNTER — Ambulatory Visit: Payer: BC Managed Care – PPO | Admitting: Psychology

## 2018-07-26 ENCOUNTER — Ambulatory Visit: Payer: Self-pay | Admitting: Psychology

## 2018-10-14 ENCOUNTER — Other Ambulatory Visit: Payer: Self-pay

## 2018-10-14 ENCOUNTER — Emergency Department (HOSPITAL_BASED_OUTPATIENT_CLINIC_OR_DEPARTMENT_OTHER): Payer: BC Managed Care – PPO

## 2018-10-14 ENCOUNTER — Encounter (HOSPITAL_BASED_OUTPATIENT_CLINIC_OR_DEPARTMENT_OTHER): Payer: Self-pay | Admitting: *Deleted

## 2018-10-14 ENCOUNTER — Emergency Department (HOSPITAL_BASED_OUTPATIENT_CLINIC_OR_DEPARTMENT_OTHER)
Admission: EM | Admit: 2018-10-14 | Discharge: 2018-10-14 | Disposition: A | Discharge status: Home / Self Care | Payer: BC Managed Care – PPO | Attending: Emergency Medicine | Admitting: Emergency Medicine

## 2018-10-14 DIAGNOSIS — Z794 Long term (current) use of insulin: Secondary | ICD-10-CM | POA: Diagnosis not present

## 2018-10-14 DIAGNOSIS — E10621 Type 1 diabetes mellitus with foot ulcer: Secondary | ICD-10-CM | POA: Insufficient documentation

## 2018-10-14 DIAGNOSIS — R2242 Localized swelling, mass and lump, left lower limb: Secondary | ICD-10-CM

## 2018-10-14 DIAGNOSIS — R600 Localized edema: Secondary | ICD-10-CM | POA: Insufficient documentation

## 2018-10-14 DIAGNOSIS — L97521 Non-pressure chronic ulcer of other part of left foot limited to breakdown of skin: Secondary | ICD-10-CM | POA: Diagnosis not present

## 2018-10-14 DIAGNOSIS — M7989 Other specified soft tissue disorders: Secondary | ICD-10-CM | POA: Diagnosis present

## 2018-10-14 LAB — CBG MONITORING, ED: Glucose-Capillary: 214 mg/dL — ABNORMAL HIGH (ref 70–99)

## 2018-10-14 MED ORDER — CLINDAMYCIN HCL 150 MG PO CAPS
300.0000 mg | ORAL_CAPSULE | Freq: Once | ORAL | Status: AC
  Administered 2018-10-14: 300 mg via ORAL
  Filled 2018-10-14: qty 2

## 2018-10-14 MED ORDER — CLINDAMYCIN HCL 150 MG PO CAPS: 300.0000 mg | ORAL_CAPSULE | Freq: Four times a day (QID) | ORAL | 0 refills | Status: DC

## 2018-10-14 NOTE — Discharge Instructions (Signed)
Please read and follow all provided instructions.  Your diagnoses today include:  1. Localized swelling of left lower extremity   2. 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 See below for your results today.   Medications prescribed:   Clindamycin - antibiotic  You have been prescribed an antibiotic medicine: take the entire course of medicine even if you are feeling better. Stopping early can cause the antibiotic not to work.  Take any prescribed medications only as directed.  Home care instructions:  Follow any educational materials contained in this packet.  BE VERY CAREFUL not to take multiple medicines containing Tylenol (also called acetaminophen). Doing so can lead to an overdose which can damage your liver and cause liver failure and possibly death.   Follow-up instructions: Please follow-up with your podiatrist in the next 2 days for further evaluation of your symptoms.   Return instructions:   Please return to the Emergency Department if you experience worsening symptoms.   Please return if you have any other emergent concerns.  Additional Information:  Your vital signs today were: BP 133/78 (BP Location: Left Arm)    Pulse 93    Temp 98.7 F (37.1 C) (Oral)    Resp 18    Ht 6\' 5"  (1.956 m)    Wt 117.9 kg    SpO2 99%    BMI 30.83 kg/m  If your blood pressure (BP) was elevated above 135/85 this visit, please have this repeated by your doctor within one month. --------------

## 2018-10-14 NOTE — ED Notes (Signed)
[id number]

## 2018-10-14 NOTE — ED Provider Notes (Signed)
[facility] EMERGENCY DEPARTMENT Provider Note   CSN: [id number] Arrival date & time: 10/14/18  1713     History   Chief Complaint Chief Complaint  Patient presents with  . Leg Swelling    HPI [name] is a 33 y.o. male.     Patient presents to the emergency department today from urgent care for rule out DVT.  Patient has a history of insulin-dependent diabetes and has a ulcer on the bottom of his left foot that is being followed by podiatry.  He has been treated with amoxicillin in the past for swelling and subsequently with shot of IM Rocephin.  After these treatments, symptoms improved.  Patient has had worsening swelling in the left foot and ankle area as well as a slight amount of discharge from lateral foot ulcer over the past several days.  No fevers, nausea or vomiting.  Patient compliant with his medication.  No history of DVT previously.     Past Medical History:  Diagnosis Date  . Diabetes mellitus     Patient Active Problem List   Diagnosis Date Noted  . CAP (community acquired pneumonia) 06/28/2017  . 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 Hyperglycemia without ketosis 01/19/2013    History reviewed. No pertinent surgical history.      Home Medications    Prior to Admission medications   Medication Sig Start Date End Date Taking? Authorizing Provider  cholecalciferol (VITAMIN D) 1000 units tablet Take 1,000 Units by mouth daily.    [provider]  Continuous Blood Gluc Sensor MISC 1 each by Does not apply route as directed. Use as directed every 14 days. May dispense [employer] Sensor System or similar. 06/30/17   Darlin DropHall, Carole N, DO  insulin aspart (NOVOLOG) 100 UNIT/ML injection Inject 0-15 Units into the skin 3 (three) times daily with meals. 07/01/17   Lonia BloodMcClung, Jeffrey T, MD  Insulin Glargine (BASAGLAR KWIKPEN) 100 UNIT/ML SOPN Inject 0.24 mLs (24 Units total) into the skin at bedtime. Patient  taking differently: Inject 40 Units into the skin every morning.  07/01/17   Lonia BloodMcClung, Jeffrey T, MD  Omega-3 Fatty Acids (FISH OIL) 1000 MG CAPS Take 1 capsule by mouth daily.    [provider]    Family History Family History  Problem Relation Age of Onset  . Hypertension Other   . Diabetes Other     Social History Social History   Tobacco Use  . Smoking status: Never Smoker  . Smokeless tobacco: Never Used  Substance Use Topics  . Alcohol use: No  . Drug use: No     Allergies   Metformin and related   Review of Systems Review of Systems  Constitutional: Negative for activity change.  Cardiovascular: Positive for leg swelling.  Musculoskeletal: Positive for arthralgias and myalgias. Negative for joint swelling and neck pain.  Skin: Positive for wound.  Neurological: Negative for weakness and numbness.     Physical Exam Updated Vital Signs BP 133/78 (BP Location: Left Arm)   Pulse 93   Temp 98.7 F (37.1 C) (Oral)   Resp 18   Ht 6\' 5"  (1.956 m)   Wt 117.9 kg   SpO2 99%   BMI 30.83 kg/m   Physical Exam Vitals signs and nursing note reviewed.  Constitutional:      Appearance: He is well-developed.  HENT:     Head: Normocephalic and atraumatic.  Eyes:     Conjunctiva/sclera: Conjunctivae normal.  Neck:  Musculoskeletal: Normal range of motion and neck supple.  Pulmonary:     Effort: No respiratory distress.  Musculoskeletal:        General: Swelling present. No tenderness.     Right lower leg: No edema.     Left lower leg: Edema present.     Comments: Patient with a 1 cm ulceration on the sole of the foot just proximal to the small toe.  No active drainage.  The foot and lower ankle are mildly swollen with slightly increased warmth when compared to the right.  Skin:    General: Skin is warm and dry.  Neurological:     Mental Status: He is alert.      ED Treatments / Results  Labs (all labs ordered are listed, but only abnormal  results are displayed) Labs Reviewed  CBG MONITORING, ED - Abnormal; Notable for the following components:      Result Value   Glucose-Capillary 214 (*)    All other components within normal limits    EKG None  Radiology US Venous Img Lower Unilateral Left  Result Date: 10/14/2018 CLINICAL DATA:  33 year old male left leg swelling and pain. EXAM: LEFT LOWER EXTREMITY VENOUS DOPPLER ULTRASOUND TECHNIQUE: Gray-scale sonography with graded compression, as well as color Doppler and duplex ultrasound were performed to evaluate the lower extremity deep venous systems from the level of the common femoral vein and including the common femoral, femoral, profunda femoral, popliteal and calf veins including the posterior tibial, peroneal and gastrocnemius veins when visible. The superficial great saphenous vein was also interrogated. Spectral Doppler was utilized to evaluate flow at rest and with distal augmentation maneuvers in the common femoral, femoral and popliteal veins. COMPARISON:  Report of CT Chest, Abdomen, and Pelvis 08/26/2017 (no images available). FINDINGS: Contralateral Common Femoral Vein: Respiratory phasicity is normal and symmetric with the symptomatic side. No evidence of thrombus. Normal compressibility. Common Femoral Vein: No evidence of thrombus. Normal compressibility, respiratory phasicity and response to augmentation. Saphenofemoral Junction: No evidence of thrombus. Normal compressibility and flow on color Doppler imaging. Profunda Femoral Vein: No evidence of thrombus. Normal compressibility and flow on color Doppler imaging. Femoral Vein: No evidence of thrombus. Normal compressibility, respiratory phasicity and response to augmentation. Popliteal Vein: No evidence of thrombus. Normal compressibility, respiratory phasicity and response to augmentation. Calf Veins: Visible calf veins are without evidence of thrombus. Normal flow on color Doppler imaging. Superficial Great Saphenous  Vein: No evidence of thrombus. Normal compressibility. Venous Reflux:  None. Other Findings: Distal right lower extremity subcutaneous edema (image 25). IMPRESSION: No evidence of left lower extremity deep venous thrombosis. Distal lower extremity subcutaneous edema. Electronically Signed   By: Genevie Ann M.D.   On: 10/14/2018 19:20    Procedures Procedures (including critical care time)  Medications Ordered in ED Medications  clindamycin (CLEOCIN) capsule 300 mg (300 mg Oral Given 10/14/18 2000)     Initial Impression / Assessment and Plan / ED Course  I have reviewed the triage vital signs and the nursing notes.  Pertinent labs & imaging results that were available during my care of the patient were reviewed by me and considered in my medical decision making (see chart for details).        Patient seen and examined.  Patient informed of negative DVT study.  I cannot rule out mild infection or start of cellulitis tonight.  Certainly increasing pain, mild drainage, and worsening swelling could be early infection.  Patient will be given clindamycin encouraged  to follow-up with his podiatrist/PCP for further instructions.  They can decide if they want to continue this treatment.  Otherwise patient cleared for discharge.  Blood sugar slightly elevated but no signs of DKA.  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  Lower extremity Doppler negative for DVT.  Cannot rule out early diabetic foot infection.  Patient started on clindamycin.  No indication for further work-up or IV meds  at this time.  ED Discharge Orders         Ordered    clindamycin (CLEOCIN) 150 MG capsule  Every 6 hours     10/14/18 2001           Renne CriglerGeiple, Damaree Sargent, PA-C 10/14/18 [address]2006    [place], Adam, DO 10/14/18 2343

## 2018-10-14 NOTE — ED Triage Notes (Signed)
Left leg swelling x several months. Reports recurrent swelling. Went to UC today and was told to come to the ED to r/o DVT

## 2018-12-14 ENCOUNTER — Encounter (HOSPITAL_BASED_OUTPATIENT_CLINIC_OR_DEPARTMENT_OTHER): Payer: BC Managed Care – PPO

## 2019-01-09 ENCOUNTER — Encounter (HOSPITAL_BASED_OUTPATIENT_CLINIC_OR_DEPARTMENT_OTHER): Payer: BC Managed Care – PPO | Admitting: Physician Assistant

## 2019-01-11 ENCOUNTER — Encounter (HOSPITAL_BASED_OUTPATIENT_CLINIC_OR_DEPARTMENT_OTHER): Payer: BC Managed Care – PPO | Attending: Internal Medicine | Admitting: Internal Medicine

## 2019-01-11 ENCOUNTER — Other Ambulatory Visit: Payer: Self-pay

## 2019-01-11 DIAGNOSIS — E1142 Type 2 diabetes mellitus with diabetic polyneuropathy: Secondary | ICD-10-CM | POA: Insufficient documentation

## 2019-01-11 DIAGNOSIS — Z794 Long term (current) use of insulin: Secondary | ICD-10-CM | POA: Insufficient documentation

## 2019-01-11 DIAGNOSIS — E11621 Type 2 diabetes mellitus with foot ulcer: Secondary | ICD-10-CM | POA: Insufficient documentation

## 2019-01-11 DIAGNOSIS — L97522 Non-pressure chronic ulcer of other part of left foot with fat layer exposed: Secondary | ICD-10-CM | POA: Insufficient documentation

## 2019-01-12 ENCOUNTER — Other Ambulatory Visit: Payer: Self-pay

## 2019-01-12 ENCOUNTER — Ambulatory Visit (HOSPITAL_COMMUNITY)
Admission: RE | Admit: 2019-01-12 | Discharge: 2019-01-12 | Disposition: A | Discharge status: Home / Self Care | Payer: BC Managed Care – PPO | Source: Ambulatory Visit | Attending: Internal Medicine | Admitting: Internal Medicine

## 2019-01-12 ENCOUNTER — Other Ambulatory Visit (HOSPITAL_COMMUNITY): Payer: Self-pay | Admitting: Internal Medicine

## 2019-01-12 DIAGNOSIS — E11621 Type 2 diabetes mellitus with foot ulcer: Secondary | ICD-10-CM | POA: Diagnosis not present

## 2019-01-12 DIAGNOSIS — L97429 Non-pressure chronic ulcer of left heel and midfoot with unspecified severity: Secondary | ICD-10-CM | POA: Insufficient documentation

## 2019-01-18 ENCOUNTER — Encounter (HOSPITAL_BASED_OUTPATIENT_CLINIC_OR_DEPARTMENT_OTHER): Payer: BC Managed Care – PPO | Admitting: Internal Medicine

## 2019-01-18 ENCOUNTER — Other Ambulatory Visit: Payer: Self-pay

## 2019-01-18 DIAGNOSIS — Z794 Long term (current) use of insulin: Secondary | ICD-10-CM | POA: Diagnosis not present

## 2019-01-18 DIAGNOSIS — E11621 Type 2 diabetes mellitus with foot ulcer: Secondary | ICD-10-CM | POA: Diagnosis present

## 2019-01-18 DIAGNOSIS — L97522 Non-pressure chronic ulcer of other part of left foot with fat layer exposed: Secondary | ICD-10-CM | POA: Diagnosis not present

## 2019-01-18 DIAGNOSIS — E1142 Type 2 diabetes mellitus with diabetic polyneuropathy: Secondary | ICD-10-CM | POA: Diagnosis not present

## 2019-01-18 NOTE — Progress Notes (Signed)
[name] [name] ([id number]) Visit Report for 01/18/2019 Debridement Details Patient Name: Date of Service: [name] [name] 01/18/2019 3:30 PM Medical Record [id number] Patient Account Number: [id number] Date of Birth/Sex: 06/02/1985 (33 y.o. M) Treating RN: [name] Primary Care Provider: [name] Other Clinician: Referring Provider: Treating Provider/Extender:Logun Colavito, [name], Ashby Dawes Weeks in Treatment: 1 Debridement Performed for Wound #1 Left Metatarsal head fifth Assessment: Performed By: Physician [name], MD Debridement Type: Debridement Severity of Tissue Pre Fat layer exposed Debridement: Level of Consciousness (Pre- Awake and Alert procedure): Pre-procedure Verification/Time Out Taken: Yes - 16:42 Start Time: 16:42 Total Area Debrided (L x W): 2.3 (cm) x 2 (cm) = 4.6 (cm) Tissue and other material Viable, Non-Viable, Callus, Subcutaneous, Skin: Epidermis debrided: Level: Skin/Subcutaneous Tissue Debridement Description: Excisional Instrument: Blade, Forceps Bleeding: Moderate Hemostasis Achieved: Silver Nitrate End Time: 16:46 Procedural Pain: 0 Post Procedural Pain: 0 Response to Treatment: Procedure was tolerated well Level of Consciousness Awake and Alert (Post-procedure): Post Debridement Measurements of Total Wound Length: (cm) 2.3 Width: (cm) 2 Depth: (cm) 0.4 Volume: (cm) 1.445 Character of Wound/Ulcer Post Improved Debridement: Severity of Tissue Post Debridement: Fat layer exposed Post Procedure Diagnosis Same as Pre-procedure Electronic Signature(s) Signed: 01/18/2019 5:33:39 PM By: [name] MD Signed: 01/18/2019 6:00:55 PM By: [name] RN, BSN Entered By: [name] on 01/18/2019 17:25:05 -------------------------------------------------------------------------------- HPI Details Patient Name: Date of Service: [name] [name] 01/18/2019 3:30 PM Medical Record [id number]  Patient Account Number: [id number] Date of Birth/Sex: Treating RN: Aug 12, 1985 (33 y.o. 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 He comes in for review of a wound over the plantar fifth metatarsal head extending into the lateral part of the foot. He was followed for this previously by his podiatrist Dr. Cornelius Moras. As the patient tells his story he went to see podiatry first for a swelling he developed on the lateral part of his fifth metatarsal head in May. He states this was "open" by podiatry and the area closed. He was followed up in June and it was again opened callus removed and it closed promptly. There were plans being made for surgery on the fifth metatarsal head in June however his blood sugar was apparently too high for anesthesia. Apparently the area was debrided and opened again in June and it is never closed since. Looking over the records from podiatry I am really not able to follow this. It was clear when he was first seen it was before 5/14 at that point he already had a wound. By 5/17 the ulcer was resolved. I do not see anything about a procedure. On 5/28 noted to have pre-ulcerative moderate keratosis. X-ray noted 1/5 contracted toe and tailor's bunion and metatarsal deformity. On a visit date on 09/28/2018 the dorsal part of the left foot it healed and resolved. There was concern about swelling in his lower extremity he was sent to the ER.. As far as I can tell he was seen in the ER on 7/12 with an ulcer on his left foot. A DVT rule out of the left leg was negative. I do not think I have complete records from podiatry but I am not able to verify the procedures this patient states he had. He states  after the last procedure the wound has never  closed although I am not able to follow this in the records I have from podiatry. He has not had a recent x-ray The patient has been using Neosporin on the wound. He is wearing a Darco shoe. He is still very active up on his foot working and exercising. Past medical history; type 2 diabetes ketosis-prone, leg swelling with a negative DVT study in July. Non-smoker ABI in our clinic was 0.85 on the left 10/16; substantial wound on the plantar left fifth met head extending laterally almost to the dorsal fifth MTP. We have been using silver alginate we gave him a Darco forefoot off loader. An x-ray did not show evidence of osteomyelitis did note soft tissue emphysema which I think was due to gas tracking through an open wound. There is no doubt in my mind he requires an MRI Electronic Signature(s) Signed: 01/18/2019 5:33:39 PM By: [name] MD Entered By: [name] on 01/18/2019 17:26:35 -------------------------------------------------------------------------------- Physical Exam Details Patient Name: Date of Service: [name] [name] 01/18/2019 3:30 PM Medical Record [id number] Patient Account Number: [id number] Date of Birth/Sex: Treating RN: 1986-01-09 (33 y.o. [name] Primary Care Provider: [name] Other Clinician: Referring Provider: Treating Provider/Extender:Jone Panebianco, [name], [name]: 1 Constitutional Patient is hypertensive.. Pulse regular and within target range for patient.Marland Kitchen Respirations regular, non-labored and within target range.. Temperature is normal and within the target range for the patient.Marland Kitchen Appears in no distress. Notes Wound exam; this is a substantial wound on the plantar fifth met head extending around the lateral part of the foot over the fifth met head dorsally. I removed some of this but there was substantial amount of bleeding. There is no exposed bone but  this is a substantial deep wound. Nothing really looked infected Electronic Signature(s) Signed: 01/18/2019 5:33:39 PM By: [name] MD Entered By: [name] on 01/18/2019 17:27:40 -------------------------------------------------------------------------------- Physician Orders Details Patient Name: Date of Service: [name] [name] 01/18/2019 3:30 PM Medical Record [id number] Patient Account Number: [id number] Date of Birth/Sex: Treating RN: 05/02/1985 (33 y.o. 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 Dressing Change Frequency Wound #1 Left Metatarsal head fifth Change dressing every day. Wound Cleansing Wound #1 Left Metatarsal head fifth May shower and wash wound with soap and water. Primary Wound Dressing Wound #1 Left Metatarsal head fifth Calcium Alginate with Silver Secondary Dressing Wound #1 Left Metatarsal head fifth Kerlix/Rolled Gauze Dry Gauze Other: - felt callous pad Off-Loading Wedge shoe to: - front offloading darco to left foot Radiology MRI, left foot with and without contrast - Non healing ulcer on left lateral foot, rule out Osteomyelitis - (ICD10 E11.621 - Type 2 diabetes mellitus with foot ulcer) Electronic Signature(s) Signed: 01/18/2019 5:33:39 PM By: [name] MD Signed: 01/18/2019 6:00:55 PM By: [name] RN, BSN Entered By: [name] on 01/18/2019 17:40:81 -------------------------------------------------------------------------------- Prescription  01/18/2019 Patient Name: [name] [name] Provider: [name] MD Date of Birth: December 11, 1985 NPI#: [id number] Sex: M DEA#: [id number] Phone #: [phone] License #: [id number] Patient Address: [place] [address] [place], [place] [address] [address]  [place], [place] [address] [phone] Allergies metformin Provider's Orders MRI, left foot with and without contrast - ICD10: E11.621 - Non healing ulcer on left lateral foot, rule out Osteomyelitis Signature(s): Date(s): Electronic Signature(s) Signed: 01/18/2019 5:33:39 PM By: [name] MD Signed: 01/18/2019 6:00:55 PM By: [name] RN, BSN Entered By: [name] on 01/18/2019 16:46:47 --------------------------------------------------------------------------------  Problem List Details Patient Name: Date of Service: [name] [name] 01/18/2019 3:30 PM Medical Record [id number] Patient Account Number: [id number] Date of Birth/Sex: Treating RN: November 24, 1985 (33 y.o. [name] Primary Care Provider: [name] Other Clinician: Referring Provider: Treating Provider/Extender:Shamus Desantis, [name], [name]: 1 Active Problems ICD-10 Evaluated Encounter Code Description Active Date Today Diagnosis E11.621 Type 2 diabetes mellitus with foot ulcer 01/11/2019 No Yes L97.521 Non-pressure chronic ulcer of other part of left foot 01/11/2019 No Yes limited to breakdown of skin E11.42 Type 2 diabetes mellitus with diabetic polyneuropathy 01/11/2019 No Yes Inactive Problems Resolved Problems Electronic Signature(s) Signed: 01/18/2019 5:33:39 PM By: [name] MD Entered By: [name] on 01/18/2019 17:24:45 -------------------------------------------------------------------------------- Progress Note Details Patient Name: Date of Service: [name] [name] 01/18/2019 3:30 PM Medical Record [id number]  Patient Account Number: [id number] Date of Birth/Sex: Treating RN: 04/27/1985 (33 y.o. 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 He was followed for this previously by his podiatrist Dr. Cornelius Moras. As the patient tells his story he went to see podiatry first for a swelling he developed on the lateral part of his fifth metatarsal head in May. He states this was "open" by podiatry and the area closed. He was followed up in June and it was again opened callus removed and it closed promptly. There were plans being made for surgery on the fifth metatarsal head in June however his blood sugar was apparently too high for anesthesia. Apparently the area was debrided and opened again in June and it is never closed since. Looking over the records from podiatry I am really not able to follow this. It was clear when he was first seen it was before 5/14 at that point he already had a wound. By 5/17 the ulcer was resolved. I do not see anything about a procedure. On 5/28 noted to have pre-ulcerative moderate keratosis. X-ray noted 1/5 contracted toe and tailor's bunion and metatarsal deformity. On a visit date on 09/28/2018 the dorsal part of the left foot it healed and resolved. There was concern about swelling in his lower extremity he was sent to the ER.. As far as I can tell he was seen in the ER on 7/12 with an ulcer on his left foot. A DVT rule out of the left leg was negative. I do not think I have complete records from podiatry but I am not able to verify the procedures this patient states he had. He states  after the last procedure the wound has never closed although I am not able to follow this in the records I have from podiatry. He has not had a recent x-ray The patient has been using Neosporin on the wound. He is wearing a Darco shoe. He is still very active up on his foot working  and exercising. Past medical history; type 2 diabetes ketosis-prone, leg swelling with a negative DVT study in July. Non-smoker ABI in our clinic was 0.85 on the left 10/16; substantial wound on the plantar left fifth met head extending laterally almost to the dorsal fifth MTP. We have been using silver alginate we gave him a Darco forefoot off loader. An x-ray did not show evidence of osteomyelitis did note soft tissue emphysema which I think was due to gas tracking through an open wound. There is no doubt in my mind he requires an MRI Objective Constitutional Patient is hypertensive.. Pulse regular and within target range for patient.Marland Kitchen Respirations regular, non-labored and within target range.. Temperature is normal and within the target range for the patient.Marland Kitchen Appears in no distress. Vitals Time Taken: 4:01 PM, Height: 77 in, Weight: 260 lbs, BMI: 30.8, Temperature: 98.2 F, Pulse: 100 bpm, Respiratory Rate: 18 breaths/min, Blood Pressure: 153/87 mmHg, Capillary Blood Glucose: 170 mg/dl. General Notes: CBG per patient General Notes: Wound exam; this is a substantial wound on the plantar fifth met head extending around the lateral part of the foot over the fifth met head dorsally. I removed some of this but there was substantial amount of bleeding. There is no exposed bone but this is a substantial deep wound. Nothing really looked infected Integumentary (Hair, Skin) Wound #1 status is Open. Original cause of wound was Trauma. The wound is located on the Left Metatarsal head fifth. The wound measures 2.3cm length x 2cm width x 0.4cm depth; 3.613cm^2 area and 1.445cm^3 volume. There is Fat Layer (Subcutaneous  Tissue) Exposed exposed. There is no tunneling noted, however, there is undermining starting at 12:00 and ending at 12:00 with a maximum distance of 2.5cm. There is a medium amount of serosanguineous drainage noted. The wound margin is well defined and not attached to the wound base. There is large (67-100%) red, pink granulation within the wound bed. There is a small (1-33%) amount of necrotic tissue within the wound bed including Adherent Slough. 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 There was a Excisional Skin/Subcutaneous Tissue Debridement with a total area of 4.6 sq cm performed by [name], MD. With the following instrument(s): Blade, and Forceps to remove Viable and Non-Viable tissue/material. Material removed includes Callus, Subcutaneous Tissue, and Skin: Epidermis. No specimens were taken. A time out was conducted at 16:42, prior to the start of the procedure. A Moderate amount of bleeding was controlled with Silver Nitrate. The procedure was tolerated well with a pain level of 0 throughout and a pain level of 0 following the procedure. Post Debridement Measurements: 2.3cm length x 2cm width x 0.4cm depth; 1.445cm^3 volume. Character of Wound/Ulcer Post Debridement is improved. Severity of Tissue Post Debridement is: Fat layer exposed. Post procedure Diagnosis Wound #1: Same as Pre-Procedure Plan Follow-up Appointments: Return Appointment in 1 week. Dressing Change Frequency: Wound #1 Left Metatarsal head fifth: Change dressing every day. Wound Cleansing: Wound #1 Left Metatarsal head  fifth: May shower and wash wound with soap and water. Primary Wound Dressing: Wound #1 Left Metatarsal head fifth: Calcium Alginate with Silver Secondary Dressing: Wound #1 Left Metatarsal head fifth: Kerlix/Rolled Gauze  Dry Gauze Other: - felt callous pad Off-Loading: Wedge shoe to: - front offloading darco to left foot Radiology ordered were: MRI, left foot with and without contrast - Non healing ulcer on left lateral foot, rule out Osteomyelitis 1. I am still using silver alginate 2. He will require an MRI of the foot suspicion about osteomyelitis is high at 3. No empiric antibiotics. Continue him in the Darco forefoot off loader Electronic Signature(s) Signed: 01/18/2019 5:33:39 PM By: [name] MD Entered By: [name] on 01/18/2019 17:28:20 -------------------------------------------------------------------------------- SuperBill Details Patient Name: Date of Service: Klimaszewski, [name] 01/18/2019 Medical Record [id number] Patient Account Number: [id number] Date of Birth/Sex: Treating RN: [birth year] (33 y.o. 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

## 2019-01-21 ENCOUNTER — Other Ambulatory Visit (HOSPITAL_COMMUNITY): Payer: Self-pay | Admitting: Internal Medicine

## 2019-01-21 ENCOUNTER — Other Ambulatory Visit: Payer: Self-pay | Admitting: Internal Medicine

## 2019-01-21 DIAGNOSIS — E11621 Type 2 diabetes mellitus with foot ulcer: Secondary | ICD-10-CM

## 2019-01-25 ENCOUNTER — Encounter (HOSPITAL_BASED_OUTPATIENT_CLINIC_OR_DEPARTMENT_OTHER): Payer: BC Managed Care – PPO | Admitting: Internal Medicine

## 2019-01-25 ENCOUNTER — Other Ambulatory Visit: Payer: Self-pay

## 2019-01-25 DIAGNOSIS — E11621 Type 2 diabetes mellitus with foot ulcer: Secondary | ICD-10-CM | POA: Diagnosis not present

## 2019-01-25 NOTE — Progress Notes (Addendum)
[name], Max ([id number]) Visit Report for 01/25/2019 Debridement Details Patient Name: Date of Service: [name], Max 01/25/2019 2:45 PM Medical Record [id number] Patient Account Number: [id number] Date of Birth/Sex: 1985-07-03 (33 y.o. M) Treating RN: [name] Primary Care Provider: [name] Other Clinician: Referring Provider: Treating Provider/Extender:Robson, [name], [name]: 2 Debridement Performed for Wound #1 Left Metatarsal head fifth Assessment: Performed By: Physician [name], MD Debridement Type: Debridement Severity of Tissue Pre Fat layer exposed Debridement: Level of Consciousness (Pre- Awake and Alert procedure): Pre-procedure Verification/Time Out Taken: Yes - 16:06 Start Time: 16:06 Total Area Debrided (L x W): 2 (cm) x 2.3 (cm) = 4.6 (cm) Tissue and other material Viable, Non-Viable, Slough, Subcutaneous, Slough debrided: Level: Skin/Subcutaneous Tissue Debridement Description: Excisional Instrument: Curette Bleeding: Minimum Hemostasis Achieved: Pressure End Time: 16:08 Procedural Pain: 0 Post Procedural Pain: 0 Response to Treatment: Procedure was tolerated well Level of Consciousness Awake and Alert (Post-procedure): Post Debridement Measurements of Total Wound Length: (cm) 2 Width: (cm) 2.3 Depth: (cm) 0.4 Volume: (cm) 1.445 Character of Wound/Ulcer Post Improved Debridement: Severity of Tissue Post Debridement: Fat layer exposed Post Procedure Diagnosis Same as Pre-procedure Electronic Signature(s) Signed: 01/25/2019 5:59:58 PM By: [name] RN, BSN Signed: 01/25/2019 6:06:00 PM By: [name] MD Entered By: [name] on 01/25/2019 16:42:38 -------------------------------------------------------------------------------- HPI Details Patient Name: Date of Service: [name], Max 01/25/2019 2:45 PM Medical Record [id number] Patient Account Number:  [id number] Date of Birth/Sex: Treating RN: 03-20-1986 (33 y.o. 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 He comes in for review of a wound over the plantar fifth metatarsal head extending into the lateral part of the foot. He was followed for this previously by his podiatrist Dr. Cornelius Moras. As the patient tells his story he went to see podiatry first for a swelling he developed on the lateral part of his fifth metatarsal head in May. He states this was "open" by podiatry and the area closed. He was followed up in June and it was again opened callus removed and it closed promptly. There were plans being made for surgery on the fifth metatarsal head in June however his blood sugar was apparently too high for anesthesia. Apparently the area was debrided and opened again in June and it is never closed since. Looking over the records from podiatry I am really not able to follow this. It was clear when he was first seen it was before 5/14 at that point he already had a wound. By 5/17 the ulcer was resolved. I do not see anything about a procedure. On 5/28 noted to have pre-ulcerative moderate keratosis. X-ray noted 1/5 contracted toe and tailor's bunion and metatarsal deformity. On a visit date on 09/28/2018 the dorsal part of the left foot it healed and resolved. There was concern about swelling in his lower extremity he was sent to the ER.. As far as I can tell he was seen in the ER on 7/12 with an ulcer on his left foot. A DVT rule out of the left leg was negative. I do not think I have complete records from podiatry but I am not able to verify the procedures this patient states he had. He states after the last  procedure the wound has never closed although I  am not able to follow this in the records I have from podiatry. He has not had a recent x-ray The patient has been using Neosporin on the wound. He is wearing a Darco shoe. He is still very active up on his foot working and exercising. Past medical history; type 2 diabetes ketosis-prone, leg swelling with a negative DVT study in July. Non-smoker ABI in our clinic was 0.85 on the left 10/16; substantial wound on the plantar left fifth met head extending laterally almost to the dorsal fifth MTP. We have been using silver alginate we gave him a Darco forefoot off loader. An x-ray did not show evidence of osteomyelitis did note soft tissue emphysema which I think was due to gas tracking through an open wound. There is no doubt in my mind he requires an MRI 10/23; MRI not booked until 3 November at the earliest this is largely due to his glucose sensor in the right arm. We have been using silver alginate. There has been an Careers information officer) Signed: 01/28/2019 5:48:14 PM By: [name] MD Signed: 01/28/2019 6:04:10 PM By: [name] RN, BSN Previous Signature: 01/25/2019 6:06:00 PM Version By: [name] MD Entered By: [name] on 01/28/2019 08:06:57 -------------------------------------------------------------------------------- Physical Exam Details Patient Name: Date of Service: [name], Max 01/25/2019 2:45 PM Medical Record [id number] Patient Account Number: [id number] Date of Birth/Sex: Treating RN: 31-Mar-1986 (33 y.o. [name] Primary Care Provider: [name] Other Clinician: Referring Provider: Treating Provider/Extender:Victorya Hillman, [name], Ashby Dawes Weeks in Treatment: 2 Constitutional Sitting or standing Blood Pressure is within target range for patient.. Pulse regular and within target range for patient.Marland Kitchen Respirations regular, non-labored and within target range..  Temperature is normal and within the target range for the patient.Marland Kitchen Appears in no distress. Notes Wound exam; substantial wound on the plantar metatarsal head. The tunneling around the lateral part of the fifth metatarsal head seems to have come down a bit. I am no longer able to get to the dorsal part of the toe there is no palpable bone no clear evidence of infection. I remove necrotic debris from the wound surface hemostasis with direct pressure Electronic Signature(s) Signed: 01/25/2019 6:06:00 PM By: [name] MD Entered By: [name] on 01/25/2019 16:44:08 -------------------------------------------------------------------------------- Physician Orders Details Patient Name: Date of Service: [name], Max 01/25/2019 2:45 PM Medical Record [id number] Patient Account Number: [id number] Date of Birth/Sex: Treating RN: 1986/03/07 (33 y.o. 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 - Thursday AM Dressing Change Frequency Wound #1 Left Metatarsal head fifth Change dressing every day. Wound Cleansing Wound #1 Left Metatarsal head fifth May shower and wash wound with soap and water. Primary Wound Dressing Wound #1 Left Metatarsal head fifth Calcium Alginate with Silver Secondary Dressing Wound #1 Left Metatarsal head fifth Kerlix/Rolled Gauze Dry Gauze Other: - felt callous pad Off-Loading Wedge shoe to: - front offloading darco to left foot Electronic Signature(s) Signed: 01/25/2019 5:59:58 PM By:  [name] RN, BSN Signed: 01/25/2019 6:06:00 PM By: [name] MD Entered By: [name] on 01/25/2019 16:10:52 -------------------------------------------------------------------------------- Problem List Details Patient Name: Date of Service: [name], Max 01/25/2019  2:45 PM Medical Record [id number] Patient Account Number: [id number] Date of Birth/Sex: Treating RN: August 02, 1985 (33 y.o. [name] Primary Care Provider: [name] Other Clinician: Referring Provider: Treating Provider/Extender:Robson, [name], [name]: 2 Active Problems ICD-10 Evaluated Encounter Code Description Active Date Today Diagnosis E11.621 Type 2 diabetes mellitus with foot ulcer 01/11/2019 No Yes L97.521 Non-pressure chronic ulcer of other part of left foot 01/11/2019 No Yes limited to breakdown of skin E11.42 Type 2 diabetes mellitus with diabetic polyneuropathy 01/11/2019 No Yes Inactive Problems Resolved Problems Electronic Signature(s) Signed: 01/25/2019 6:06:00 PM By: [name] MD Entered By: [name] on 01/25/2019 16:42:21 -------------------------------------------------------------------------------- Progress Note Details Patient Name: Date of Service: [name], Max 01/25/2019 2:45 PM Medical Record [id number] Patient Account Number: [id number] Date of Birth/Sex: Treating RN: [birth year] (33 y.o. 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 He comes in for review of a wound over the plantar fifth metatarsal head extending into the lateral part of the foot. He was followed for this previously by his podiatrist Dr. Cornelius Moras. As the  patient tells his story he went to see podiatry first for a swelling he developed on the lateral part of his fifth metatarsal head in May. He states this was "open" by podiatry and the area closed. He was followed up in June and it was again opened callus removed and it closed promptly. There were plans being made for surgery on the fifth metatarsal head in June however his blood sugar was apparently too high for anesthesia. Apparently the area was debrided and opened again in June and it is never closed since. Looking over the records from podiatry I am really not able to follow this. It was clear when he was first seen it was before 5/14 at that point he already had a wound. By 5/17 the ulcer was resolved. I do not see anything about a procedure. On 5/28 noted to have pre-ulcerative moderate keratosis. X-ray noted 1/5 contracted toe and tailor's bunion and metatarsal deformity. On a visit date on 09/28/2018 the dorsal part of the left foot it healed and resolved. There was concern about swelling in his lower extremity he was sent to the ER.. As far as I can tell he was seen in the ER on 7/12 with an ulcer on his left foot. A DVT rule out of the left leg was negative. I do not think I have complete records from podiatry but I am not able to verify the procedures this patient states he had. He states after the last procedure the wound has never closed although I am not able to follow this in the records I have from podiatry. He has not had a recent x-ray The patient has been using Neosporin on the wound. He is wearing a Darco shoe. He is still very active up on his foot working and exercising. Past medical history; type 2 diabetes ketosis-prone, leg swelling with a negative DVT study in July. Non-smoker ABI in our clinic was 0.85 on the left 10/16; substantial wound on the plantar left fifth met head extending laterally almost to the dorsal fifth MTP. We have been using silver alginate we gave him  a Darco forefoot off loader. An x-ray did not show evidence of osteomyelitis did note  soft tissue emphysema which I think was due to gas tracking through an open wound. There is no doubt in my mind he requires an MRI 10/23; MRI not booked until 3 November at the earliest this is largely due to his glucose sensor in the right arm. We have been using silver alginate. There has been an improvement Objective Constitutional Sitting or standing Blood Pressure is within target range for patient.. Pulse regular and within target range for patient.Marland Kitchen Respirations regular, non-labored and within target range.. Temperature is normal and within the target range for the patient.Marland Kitchen Appears in no distress. Vitals Time Taken: 3:32 PM, Height: 77 in, Weight: 260 lbs, BMI: 30.8, Temperature: 98.8 F, Pulse: 89 bpm, Respiratory Rate: 18 breaths/min, Blood Pressure: 135/78 mmHg, Capillary Blood Glucose: 204 mg/dl. General Notes: Wound exam; substantial wound on the plantar metatarsal head. The tunneling around the lateral part of the fifth metatarsal head seems to have come down a bit. I am no longer able to get to the dorsal part of the toe there is no palpable bone no clear evidence of infection. I remove necrotic debris from the wound surface hemostasis with direct pressure Integumentary (Hair, Skin) Wound #1 status is Open. Original cause of wound was Trauma. The wound is located on the Left Metatarsal head fifth. The wound measures 2cm length x 2.3cm width x 0.4cm depth; 3.613cm^2 area and 1.445cm^3 volume. There is Fat Layer (Subcutaneous Tissue) Exposed exposed. There is undermining starting at 12:00 and ending at 12:00 with a maximum distance of 2.3cm. There is a medium amount of serosanguineous drainage noted. The wound margin is well defined and not attached to the wound base. There is large (67-100%) red, pink granulation within the wound bed. There is a small (1-33%) amount of necrotic tissue within the  wound bed including Adherent Slough. 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 There was a Excisional Skin/Subcutaneous Tissue Debridement with a total area of 4.6 sq cm performed by [name], MD. With the following instrument(s): Curette to remove Viable and Non-Viable tissue/material. Material removed includes Subcutaneous Tissue and Slough and. No specimens were taken. A time out was conducted at 16:06, prior to the start of the procedure. A Minimum amount of bleeding was controlled with Pressure. The procedure was tolerated well with a pain level of 0 throughout and a pain level of 0 following the procedure. Post Debridement Measurements: 2cm length x 2.3cm width x 0.4cm depth; 1.445cm^3 volume. Character of Wound/Ulcer Post Debridement is improved. Severity of Tissue Post Debridement is: Fat layer exposed. Post procedure Diagnosis Wound #1: Same as Pre-Procedure Plan Follow-up Appointments: Return Appointment in 1 week. - Thursday AM Dressing Change Frequency: Wound #1 Left Metatarsal head fifth: Change dressing every day. Wound Cleansing: Wound #1 Left Metatarsal head fifth: May shower and wash wound with soap and water. Primary Wound Dressing: Wound #1 Left Metatarsal head fifth: Calcium Alginate with Silver Secondary Dressing: Wound #1 Left Metatarsal head fifth: Kerlix/Rolled Gauze Dry Gauze Other: - felt callous pad Off-Loading: Wedge shoe to: - front offloading darco to left foot 1. Left fifth metatarsal head. The tunneling laterally around the lateral fifth metatarsal head related to  the  dorsal toe seems better. He can only get to the mid lateral part of the fifth met head on the left. 2 still using silver alginate 3. Offloading with a Darco forefoot off loader 4 I asked him to try to get the MRI as soon as possible. If he does have osteohe'll need antibiotics. If he does not he might benefit from a total contact cast Electronic Signature(s) Signed: 01/28/2019 5:48:14 PM By: [name] MD Signed: 01/28/2019 6:04:10 PM By: [name] RN, BSN Previous Signature: 01/25/2019 6:06:00 PM Version By: [name] MD Entered By: [name] on 01/28/2019 08:07:11 -------------------------------------------------------------------------------- [place] Details Patient Name: Date of Service: [name], Max 01/25/2019 Medical Record [id number] Patient Account Number: [id number] Date of Birth/Sex: Treating RN: Aug 08, 1985 (33 y.o. 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 Facility Procedures CPT4 Code Description: [id number] - DEB SUBQ TISSUE 20 SQ CM/< ICD-10 Diagnosis Description E11.621 Type 2 diabetes mellitus with foot ulcer L97.521 Non-pressure chronic ulcer of other part of left foot limite Modifier: d to breakdo Quantity: 1 wn of skin Physician Procedures CPT4 Code Description: [id number] - WC PHYS SUBQ TISS 20 SQ CM ICD-10 Diagnosis Description E11.621 Type 2 diabetes mellitus with foot ulcer L97.521 Non-pressure chronic ulcer of other part of left foot limite Modifier: d to breakdo Quantity: 1 wn of skin Electronic Signature(s) Signed: 01/25/2019 6:06:00 PM  By: [name] MD Entered By: [name] on 01/25/2019 16:47:58

## 2019-01-31 ENCOUNTER — Encounter (HOSPITAL_BASED_OUTPATIENT_CLINIC_OR_DEPARTMENT_OTHER): Payer: BC Managed Care – PPO | Admitting: Internal Medicine

## 2019-01-31 ENCOUNTER — Other Ambulatory Visit: Payer: Self-pay

## 2019-02-03 ENCOUNTER — Encounter (HOSPITAL_COMMUNITY): Payer: Self-pay | Admitting: Emergency Medicine

## 2019-02-03 ENCOUNTER — Inpatient Hospital Stay (HOSPITAL_COMMUNITY)
Admission: EM | Admit: 2019-02-03 | Discharge: 2019-02-06 | DRG: 871 | Disposition: A | Discharge status: Home / Self Care | Payer: BC Managed Care – PPO | Source: Ambulatory Visit | Attending: Internal Medicine | Admitting: Internal Medicine

## 2019-02-03 ENCOUNTER — Ambulatory Visit (INDEPENDENT_AMBULATORY_CARE_PROVIDER_SITE_OTHER)
Admission: EM | Admit: 2019-02-03 | Discharge: 2019-02-03 | Disposition: A | Discharge status: Home / Self Care | Payer: BC Managed Care – PPO | Source: Home / Self Care | Attending: Internal Medicine | Admitting: Internal Medicine

## 2019-02-03 ENCOUNTER — Encounter (HOSPITAL_COMMUNITY): Payer: Self-pay | Admitting: *Deleted

## 2019-02-03 ENCOUNTER — Other Ambulatory Visit: Payer: Self-pay

## 2019-02-03 ENCOUNTER — Emergency Department (HOSPITAL_COMMUNITY): Payer: BC Managed Care – PPO

## 2019-02-03 DIAGNOSIS — M009 Pyogenic arthritis, unspecified: Secondary | ICD-10-CM | POA: Diagnosis present

## 2019-02-03 DIAGNOSIS — E669 Obesity, unspecified: Secondary | ICD-10-CM | POA: Diagnosis present

## 2019-02-03 DIAGNOSIS — Z833 Family history of diabetes mellitus: Secondary | ICD-10-CM

## 2019-02-03 DIAGNOSIS — E1165 Type 2 diabetes mellitus with hyperglycemia: Secondary | ICD-10-CM | POA: Insufficient documentation

## 2019-02-03 DIAGNOSIS — Z79899 Other long term (current) drug therapy: Secondary | ICD-10-CM | POA: Diagnosis not present

## 2019-02-03 DIAGNOSIS — L97523 Non-pressure chronic ulcer of other part of left foot with necrosis of muscle: Secondary | ICD-10-CM

## 2019-02-03 DIAGNOSIS — J111 Influenza due to unidentified influenza virus with other respiratory manifestations: Secondary | ICD-10-CM | POA: Diagnosis not present

## 2019-02-03 DIAGNOSIS — N179 Acute kidney failure, unspecified: Secondary | ICD-10-CM | POA: Diagnosis present

## 2019-02-03 DIAGNOSIS — Z794 Long term (current) use of insulin: Secondary | ICD-10-CM

## 2019-02-03 DIAGNOSIS — U071 COVID-19: Secondary | ICD-10-CM | POA: Diagnosis present

## 2019-02-03 DIAGNOSIS — E860 Dehydration: Secondary | ICD-10-CM | POA: Diagnosis present

## 2019-02-03 DIAGNOSIS — E139 Other specified diabetes mellitus without complications: Secondary | ICD-10-CM | POA: Diagnosis not present

## 2019-02-03 DIAGNOSIS — J9601 Acute respiratory failure with hypoxia: Secondary | ICD-10-CM | POA: Diagnosis present

## 2019-02-03 DIAGNOSIS — R739 Hyperglycemia, unspecified: Secondary | ICD-10-CM | POA: Diagnosis present

## 2019-02-03 DIAGNOSIS — Z6830 Body mass index (BMI) 30.0-30.9, adult: Secondary | ICD-10-CM | POA: Diagnosis not present

## 2019-02-03 DIAGNOSIS — L97429 Non-pressure chronic ulcer of left heel and midfoot with unspecified severity: Secondary | ICD-10-CM | POA: Diagnosis present

## 2019-02-03 DIAGNOSIS — M86672 Other chronic osteomyelitis, left ankle and foot: Secondary | ICD-10-CM | POA: Diagnosis present

## 2019-02-03 DIAGNOSIS — R34 Anuria and oliguria: Secondary | ICD-10-CM | POA: Diagnosis not present

## 2019-02-03 DIAGNOSIS — E1169 Type 2 diabetes mellitus with other specified complication: Secondary | ICD-10-CM | POA: Diagnosis present

## 2019-02-03 DIAGNOSIS — Z8701 Personal history of pneumonia (recurrent): Secondary | ICD-10-CM | POA: Diagnosis not present

## 2019-02-03 DIAGNOSIS — R6520 Severe sepsis without septic shock: Secondary | ICD-10-CM

## 2019-02-03 DIAGNOSIS — J1289 Other viral pneumonia: Secondary | ICD-10-CM | POA: Diagnosis present

## 2019-02-03 DIAGNOSIS — A419 Sepsis, unspecified organism: Secondary | ICD-10-CM | POA: Diagnosis present

## 2019-02-03 DIAGNOSIS — Z23 Encounter for immunization: Secondary | ICD-10-CM

## 2019-02-03 DIAGNOSIS — Z888 Allergy status to other drugs, medicaments and biological substances status: Secondary | ICD-10-CM

## 2019-02-03 DIAGNOSIS — E11621 Type 2 diabetes mellitus with foot ulcer: Secondary | ICD-10-CM | POA: Diagnosis present

## 2019-02-03 DIAGNOSIS — E871 Hypo-osmolality and hyponatremia: Secondary | ICD-10-CM | POA: Diagnosis present

## 2019-02-03 DIAGNOSIS — E1036 Type 1 diabetes mellitus with diabetic cataract: Secondary | ICD-10-CM | POA: Diagnosis present

## 2019-02-03 DIAGNOSIS — E1065 Type 1 diabetes mellitus with hyperglycemia: Secondary | ICD-10-CM | POA: Diagnosis not present

## 2019-02-03 LAB — CBC
HCT: 42.3 % (ref 39.0–52.0)
Hemoglobin: 13.5 g/dL (ref 13.0–17.0)
MCH: 27.1 pg (ref 26.0–34.0)
MCHC: 31.9 g/dL (ref 30.0–36.0)
MCV: 84.9 fL (ref 80.0–100.0)
Platelets: 157 10*3/uL (ref 150–400)
RBC: 4.98 MIL/uL (ref 4.22–5.81)
RDW: 11.9 % (ref 11.5–15.5)
WBC: 4.2 10*3/uL (ref 4.0–10.5)
nRBC: 0 % (ref 0.0–0.2)

## 2019-02-03 LAB — CBC WITH DIFFERENTIAL/PLATELET
Abs Immature Granulocytes: 0.01 10*3/uL (ref 0.00–0.07)
Basophils Absolute: 0 10*3/uL (ref 0.0–0.1)
Basophils Relative: 0 %
Eosinophils Absolute: 0 10*3/uL (ref 0.0–0.5)
Eosinophils Relative: 0 %
HCT: 41.4 % (ref 39.0–52.0)
Hemoglobin: 13.4 g/dL (ref 13.0–17.0)
Immature Granulocytes: 0 %
Lymphocytes Relative: 16 %
Lymphs Abs: 0.6 10*3/uL — ABNORMAL LOW (ref 0.7–4.0)
MCH: 27.3 pg (ref 26.0–34.0)
MCHC: 32.4 g/dL (ref 30.0–36.0)
MCV: 84.5 fL (ref 80.0–100.0)
Monocytes Absolute: 0.5 10*3/uL (ref 0.1–1.0)
Monocytes Relative: 14 %
Neutro Abs: 2.5 10*3/uL (ref 1.7–7.7)
Neutrophils Relative %: 70 %
Platelets: 168 10*3/uL (ref 150–400)
RBC: 4.9 MIL/uL (ref 4.22–5.81)
RDW: 12 % (ref 11.5–15.5)
WBC: 3.5 10*3/uL — ABNORMAL LOW (ref 4.0–10.5)
nRBC: 0 % (ref 0.0–0.2)

## 2019-02-03 LAB — CBG MONITORING, ED: Glucose-Capillary: 355 mg/dL — ABNORMAL HIGH (ref 70–99)

## 2019-02-03 LAB — BASIC METABOLIC PANEL
Anion gap: 10 (ref 5–15)
BUN: 15 mg/dL (ref 6–20)
CO2: 27 mmol/L (ref 22–32)
Calcium: 9 mg/dL (ref 8.9–10.3)
Chloride: 92 mmol/L — ABNORMAL LOW (ref 98–111)
Creatinine, Ser: 1.39 mg/dL — ABNORMAL HIGH (ref 0.61–1.24)
GFR calc Af Amer: >60 mL/min (ref >60)
GFR calc non Af Amer: >60 mL/min (ref >60)
Glucose, Bld: 411 mg/dL — ABNORMAL HIGH (ref 70–99)
Potassium: 5.1 mmol/L (ref 3.5–5.1)
Sodium: 129 mmol/L — ABNORMAL LOW (ref 135–145)

## 2019-02-03 LAB — HEMOGLOBIN A1C
Hgb A1c MFr Bld: 9.9 % — ABNORMAL HIGH (ref 4.8–5.6)
Mean Plasma Glucose: 237.43 mg/dL

## 2019-02-03 LAB — COMPREHENSIVE METABOLIC PANEL
ALT: 39 U/L (ref 0–44)
AST: 25 U/L (ref 15–41)
Albumin: 3.4 g/dL — ABNORMAL LOW (ref 3.5–5.0)
Alkaline Phosphatase: 82 U/L (ref 38–126)
Anion gap: 10 (ref 5–15)
BUN: 15 mg/dL (ref 6–20)
CO2: 25 mmol/L (ref 22–32)
Calcium: 8.9 mg/dL (ref 8.9–10.3)
Chloride: 93 mmol/L — ABNORMAL LOW (ref 98–111)
Creatinine, Ser: 1.5 mg/dL — ABNORMAL HIGH (ref 0.61–1.24)
GFR calc Af Amer: >60 mL/min (ref >60)
GFR calc non Af Amer: >60 mL/min (ref >60)
Glucose, Bld: 401 mg/dL — ABNORMAL HIGH (ref 70–99)
Potassium: 5.9 mmol/L — ABNORMAL HIGH (ref 3.5–5.1)
Sodium: 128 mmol/L — ABNORMAL LOW (ref 135–145)
Total Bilirubin: 0.5 mg/dL (ref 0.3–1.2)
Total Protein: 7.3 g/dL (ref 6.5–8.1)

## 2019-02-03 LAB — I-STAT CHEM 8, ED
BUN: 19 mg/dL (ref 6–20)
Calcium, Ion: 1.19 mmol/L (ref 1.15–1.40)
Chloride: 91 mmol/L — ABNORMAL LOW (ref 98–111)
Creatinine, Ser: 1.3 mg/dL — ABNORMAL HIGH (ref 0.61–1.24)
Glucose, Bld: 403 mg/dL — ABNORMAL HIGH (ref 70–99)
HCT: 47 % (ref 39.0–52.0)
Hemoglobin: 16 g/dL (ref 13.0–17.0)
Potassium: 4.5 mmol/L (ref 3.5–5.1)
Sodium: 131 mmol/L — ABNORMAL LOW (ref 135–145)
TCO2: 29 mmol/L (ref 22–32)

## 2019-02-03 LAB — LACTIC ACID, PLASMA
Lactic Acid, Venous: 1.5 mmol/L (ref 0.5–1.9)
Lactic Acid, Venous: 2 mmol/L (ref 0.5–1.9)
Lactic Acid, Venous: 1.8 mmol/L (ref 0.5–1.9)

## 2019-02-03 LAB — APTT: aPTT: 28 seconds (ref 24–36)

## 2019-02-03 LAB — PROTIME-INR
INR: 1.1 (ref 0.8–1.2)
Prothrombin Time: 13.9 seconds (ref 11.4–15.2)

## 2019-02-03 MED ORDER — PIPERACILLIN-TAZOBACTAM 3.375 G IVPB 30 MIN
3.3750 g | Freq: Once | INTRAVENOUS | Status: AC
  Administered 2019-02-03: 3.375 g via INTRAVENOUS
  Filled 2019-02-03: qty 50

## 2019-02-03 MED ORDER — OXYCODONE HCL 5 MG PO TABS
5.0000 mg | ORAL_TABLET | ORAL | Status: DC | PRN
  Administered 2019-02-04 – 2019-02-05 (×2): 5 mg via ORAL
  Filled 2019-02-03 (×2): qty 1

## 2019-02-03 MED ORDER — INSULIN ASPART 100 UNIT/ML ~~LOC~~ SOLN
0.0000 [IU] | Freq: Three times a day (TID) | SUBCUTANEOUS | Status: DC
  Administered 2019-02-03: 15 [IU] via SUBCUTANEOUS
  Administered 2019-02-04: 8 [IU] via SUBCUTANEOUS
  Administered 2019-02-04: 11 [IU] via SUBCUTANEOUS
  Administered 2019-02-04: 8 [IU] via SUBCUTANEOUS
  Administered 2019-02-05: 5 [IU] via SUBCUTANEOUS
  Administered 2019-02-05 – 2019-02-06 (×3): 3 [IU] via SUBCUTANEOUS

## 2019-02-03 MED ORDER — INSULIN ASPART 100 UNIT/ML ~~LOC~~ SOLN
0.0000 [IU] | Freq: Every day | SUBCUTANEOUS | Status: DC
  Administered 2019-02-04: 2 [IU] via SUBCUTANEOUS
  Administered 2019-02-04: 5 [IU] via SUBCUTANEOUS

## 2019-02-03 MED ORDER — SODIUM CHLORIDE 0.9 % IV SOLN: INTRAVENOUS | Status: DC

## 2019-02-03 MED ORDER — ONDANSETRON HCL 4 MG PO TABS: 4.0000 mg | ORAL_TABLET | Freq: Four times a day (QID) | ORAL | Status: DC | PRN

## 2019-02-03 MED ORDER — VANCOMYCIN HCL 10 G IV SOLR
1250.0000 mg | Freq: Two times a day (BID) | INTRAVENOUS | Status: DC
  Administered 2019-02-04: 1250 mg via INTRAVENOUS
  Filled 2019-02-03 (×3): qty 1250

## 2019-02-03 MED ORDER — METRONIDAZOLE IN NACL 5-0.79 MG/ML-% IV SOLN
500.0000 mg | Freq: Three times a day (TID) | INTRAVENOUS | Status: DC
  Administered 2019-02-04 (×2): 500 mg via INTRAVENOUS
  Filled 2019-02-03 (×2): qty 100

## 2019-02-03 MED ORDER — SODIUM CHLORIDE 0.9 % IV BOLUS
1000.0000 mL | Freq: Once | INTRAVENOUS | Status: AC
  Administered 2019-02-03: 1000 mL via INTRAVENOUS

## 2019-02-03 MED ORDER — ONDANSETRON HCL 4 MG/2ML IJ SOLN
4.0000 mg | Freq: Four times a day (QID) | INTRAMUSCULAR | Status: DC | PRN
  Administered 2019-02-05: 4 mg via INTRAVENOUS
  Filled 2019-02-03: qty 2

## 2019-02-03 MED ORDER — SODIUM CHLORIDE 0.9 % IV BOLUS
2000.0000 mL | Freq: Once | INTRAVENOUS | Status: AC
  Administered 2019-02-03: 2000 mL via INTRAVENOUS

## 2019-02-03 MED ORDER — VANCOMYCIN HCL 10 G IV SOLR
2000.0000 mg | Freq: Once | INTRAVENOUS | Status: AC
  Administered 2019-02-03: 2000 mg via INTRAVENOUS
  Filled 2019-02-03: qty 2000

## 2019-02-03 MED ORDER — SODIUM CHLORIDE 0.9% FLUSH: 3.0000 mL | Freq: Once | INTRAVENOUS | Status: DC

## 2019-02-03 MED ORDER — ACETAMINOPHEN 325 MG PO TABS
650.0000 mg | ORAL_TABLET | Freq: Once | ORAL | Status: AC | PRN
  Administered 2019-02-04: 08:00:00 650 mg via ORAL
  Filled 2019-02-03: qty 2

## 2019-02-03 MED ORDER — ONDANSETRON HCL 4 MG/2ML IJ SOLN
4.0000 mg | Freq: Once | INTRAMUSCULAR | Status: AC
  Administered 2019-02-03: 4 mg via INTRAVENOUS
  Filled 2019-02-03: qty 2

## 2019-02-03 MED ORDER — HEPARIN SODIUM (PORCINE) 5000 UNIT/ML IJ SOLN
5000.0000 [IU] | Freq: Three times a day (TID) | INTRAMUSCULAR | Status: DC
  Administered 2019-02-04 – 2019-02-05 (×5): 5000 [IU] via SUBCUTANEOUS
  Filled 2019-02-03 (×5): qty 1

## 2019-02-03 MED ORDER — SODIUM CHLORIDE 0.9 % IV SOLN
2.0000 g | Freq: Three times a day (TID) | INTRAVENOUS | Status: DC
  Administered 2019-02-04 – 2019-02-06 (×8): 2 g via INTRAVENOUS
  Filled 2019-02-03 (×13): qty 2

## 2019-02-03 MED ORDER — ACETAMINOPHEN 325 MG PO TABS
650.0000 mg | ORAL_TABLET | Freq: Once | ORAL | Status: AC
  Administered 2019-02-03: 15:00:00 650 mg via ORAL
  Filled 2019-02-03: qty 2

## 2019-02-03 MED ORDER — VANCOMYCIN HCL IN DEXTROSE 1-5 GM/200ML-% IV SOLN: 1000.0000 mg | Freq: Once | INTRAVENOUS | Status: DC

## 2019-02-03 MED ORDER — INSULIN DETEMIR 100 UNIT/ML ~~LOC~~ SOLN
15.0000 [IU] | Freq: Two times a day (BID) | SUBCUTANEOUS | Status: DC
  Administered 2019-02-04 (×2): 15 [IU] via SUBCUTANEOUS
  Filled 2019-02-03 (×5): qty 0.15

## 2019-02-03 MED ORDER — INFLUENZA VAC SPLIT QUAD 0.5 ML IM SUSY
0.5000 mL | PREFILLED_SYRINGE | INTRAMUSCULAR | Status: AC
  Administered 2019-02-04: 09:00:00 0.5 mL via INTRAMUSCULAR
  Filled 2019-02-03: qty 0.5

## 2019-02-03 NOTE — Progress Notes (Addendum)
1950 Pt admitted to floor from ED via gurney accompanied by 2 staff members. Pt ambulated to bed with steady gait. Aox3 and verbally responsive. No c/o pain or discomfort at this time. Pt washed self up at sink and changed into gown with minimal assistance. VS and BG taken at this time . IV NS started @ 171ml/hr, cardiac monitor attached. Skin assessment completed w/ A Pennington RN. Noted 4 scabbed areas to RLE, Drsg to L foot with telfa pad and guaze wrap. Oriented to ward, room bed controls, and callbell. Will cont to monitor.  2035 Lab tech in to draw blood work.  2100 Family member came to drop off personal items to pt.

## 2019-02-03 NOTE — ED Provider Notes (Signed)
[facility]    CSN: [id number] Arrival date & time: 02/03/19  1231      History   Chief Complaint Chief Complaint  Patient presents with  . Appointment    1240  . Fever    HPI [name] is a 33 y.o. male with a history of diabetes mellitus type 2 managed on insulin since 2012 comes to urgent care with complaints of fever of 100.7, cough, general fatigue and body aches over the past few days.  Patient says symptoms started insidiously and is gotten progressively worse.  He has had loss of appetite and has not been able to eat except for some carrots over the past 3 days.  He has a cough which is nonproductive.  Fever and some shortness of breath.  He complains of having diarrhea which is nonbloody.  Patient denies any sick contacts.  He feels generally weak with some dizziness and fell this morning.  Patient has been following the outpatient wound clinic for chronic left foot ulcer.  He is scheduled to have MRI of the left foot to assess for osteomyelitis.  Last visit to the wound clinic was on Thursday and at that time he was febrile.  No nausea, vomiting or abdominal pain.  His urine output has decreased.   HPI  Past Medical History:  Diagnosis Date  . Diabetes mellitus     Patient Active Problem List   Diagnosis Date Noted  . CAP (community acquired pneumonia) 06/28/2017  . 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 Hyperglycemia without ketosis 01/19/2013    History reviewed. No pertinent surgical history.     Home Medications    Prior to Admission medications   Medication Sig Start Date End Date Taking? Authorizing Provider  Insulin Glargine (BASAGLAR KWIKPEN) 100 UNIT/ML SOPN Inject 0.24 mLs (24 Units total) into the skin at bedtime. Patient taking differently: Inject 40 Units into the skin every morning.  07/01/17  Yes Lonia Blood, MD  cholecalciferol (VITAMIN D) 1000 units tablet Take 1,000 Units by mouth daily.     [provider]  clindamycin (CLEOCIN) 150 MG capsule Take 2 capsules (300 mg total) by mouth every 6 (six) hours. 10/14/18   Renne Crigler, PA-C  Continuous Blood Gluc Sensor MISC 1 each by Does not apply route as directed. Use as directed every 14 days. May dispense FreeStyle [employer] or similar. 06/30/17   Darlin Drop, DO  insulin aspart (NOVOLOG) 100 UNIT/ML injection Inject 0-15 Units into the skin 3 (three) times daily with meals. 07/01/17   Lonia Blood, MD  Omega-3 Fatty Acids (FISH OIL) 1000 MG CAPS Take 1 capsule by mouth daily.    [provider]    Family History Family History  Problem Relation Age of Onset  . Hypertension Other   . Diabetes Other   . Healthy Mother     Social History Social History   Tobacco Use  . Smoking status: Never Smoker  . Smokeless tobacco: Never Used  Substance Use Topics  . Alcohol use: No  . Drug use: No     Allergies   Metformin and related   Review of Systems Review of Systems  Constitutional: Positive for activity change, chills, fatigue and fever. Negative for unexpected weight change.  HENT: Positive for sore throat. Negative for sinus pressure and sinus pain.   Eyes: Negative.   Respiratory: Positive for cough and shortness of breath. Negative for chest tightness and  wheezing.   Cardiovascular: Negative.   Gastrointestinal: Positive for diarrhea. Negative for abdominal pain, nausea and vomiting.  Genitourinary: Positive for decreased urine volume. Negative for dysuria, enuresis, flank pain, frequency, genital sores and hematuria.  Skin: Positive for color change and wound. Negative for rash.  Neurological: Positive for dizziness, weakness and light-headedness. Negative for tremors, syncope, numbness and headaches.  Psychiatric/Behavioral: Positive for decreased concentration. Negative for confusion.  All other systems reviewed and are negative.    Physical Exam Triage Vital Signs ED  Triage Vitals  Enc Vitals Group     BP      Pulse      Resp      Temp      Temp src      SpO2      Weight      Height      Head Circumference      Peak Flow      Pain Score      Pain Loc      Pain Edu?      Excl. in [place]?    No data found.  Updated Vital Signs BP 108/65 (BP Location: Left Arm)   Pulse 89   Temp (!) 100.7 F (38.2 C) (Oral)   Resp 18   SpO2 100%   Visual Acuity Right Eye Distance:   Left Eye Distance:   Bilateral Distance:    Right Eye Near:   Left Eye Near:    Bilateral Near:     Physical Exam Vitals signs and nursing note reviewed.  Constitutional:      General: He is in acute distress.     Appearance: He is ill-appearing and toxic-appearing. He is not diaphoretic.  HENT:     Right Ear: Tympanic membrane normal.     Left Ear: Tympanic membrane normal.     Nose: Nose normal.     Mouth/Throat:     Mouth: Mucous membranes are moist.     Pharynx: No posterior oropharyngeal erythema.  Eyes:     Conjunctiva/sclera: Conjunctivae normal.  Neck:     Musculoskeletal: Normal range of motion. No neck rigidity or muscular tenderness.  Cardiovascular:     Rate and Rhythm: Normal rate and regular rhythm.     Pulses: Normal pulses.     Heart sounds: No murmur. No friction rub.  Pulmonary:     Effort: Pulmonary effort is normal. No respiratory distress.     Breath sounds: No stridor. No wheezing or rhonchi.  Abdominal:     General: Bowel sounds are normal.     Palpations: Abdomen is soft.     Tenderness: There is no abdominal tenderness. There is no guarding or rebound.     Hernia: No hernia is present.  Musculoskeletal: Normal range of motion.        General: No swelling, tenderness, deformity or signs of injury.  Skin:    General: Skin is warm.     Capillary Refill: Capillary refill takes 2 to 3 seconds.     Findings: Lesion present.  Neurological:     General: No focal deficit present.     Mental Status: He is alert.     Cranial Nerves: No  cranial nerve deficit.     Sensory: No sensory deficit.     Motor: No weakness.     Coordination: Coordination normal.     Gait: Gait normal.  Psychiatric:     Comments: Patient has decreased concentration.  Decreased sensation over both feet.  Patient has an ulcer over the plantar surface of the left foot.      UC Treatments / Results  Labs (all labs ordered are listed, but only abnormal results are displayed) Labs Reviewed  NOVEL CORONAVIRUS, NAA (HOSP ORDER, SEND-OUT TO REF LAB; TAT 18-24 HRS)  CBC  BASIC METABOLIC PANEL    EKG   Radiology No results found.  Procedures Procedures (including critical care time)  Medications Ordered in UC Medications - No data to display  Initial Impression / Assessment and Plan / UC Course  I have reviewed the triage vital signs and the nursing notes.  Pertinent labs & imaging results that were available during my care of the patient were reviewed by me and considered in my medical decision making (see chart for details).     1.  Flulike illness: COVID-19 testing Patient will need to go to ED given that the patient has decreased concentration and looks dehydrated.  2.  Suspected acute kidney injury secondary to poor oral intake: Patient will need further work-up and management in the ED.  I suspect that the patient may have acute kidney injury AND/or DKA.  Patient needs further evaluation in the ED to assess for hospitalization. Final Clinical Impressions(s) / UC Diagnoses   Final diagnoses:  Influenza-like illness  AKI (acute kidney injury) [facility](HCC)   Discharge Instructions   None    ED Prescriptions    None     PDMP not reviewed this encounter.   Merrilee JanskyLamptey, Shanta Hartner O, MD 02/03/19 1336

## 2019-02-03 NOTE — H&P (Signed)
History and Physical    [name] OHY:[id number] DOB: [birth year] DOA: 02/03/2019  PCP: [name], MD  Patient coming from: home via urgent care   I have personally briefly reviewed patient's old medical records available.   Chief Complaint: fever , nausea, dehydration   HPI: [name] is a 33 y.o. male with medical history significant of insulin-dependent type 2 diabetes, chronic left diabetic foot ulcer presented to urgent care center today with temperature of 100.7, dry cough, generalized fatigue and body ache for at least last 3 days.  According to the patient, he had not been feeling well since about a week, he has poor appetite.  Since last 3 days, he had multiple episodes where he had vomiting with some epigastric pain, occasional loose stool that is mostly watery.  Denies any congestion, sputum production.  He occasionally have cough however denies any other pulmonary symptoms including shortness of breath or wheezing.  No chest pain.  His main symptoms are feeling fatigued and weak and dizzy that led to fall this morning.  No sick contacts.  Denies any COVID-19 exposure at home or at work.  Patient does have a diabetic foot ulcer on his left fifth metatarsal head, has seen different providers, recently seen at wound care clinic and plan for MRI for with he had his diabetic sensor off and was a scheduled for MRI next week.  He is not on any oral antibiotics.  Is doing local wound care.  Because of being sick, he has not been using his insulin, last use was 3 days ago.  His urine output has decreased and urine looks dark. ED Course: He went to urgent care, his temperature was 102.5, blood pressure was 92/50.  With his symptoms and low blood pressures, immediately transferred to emergency room where he is treated as sepsis. WC count is 3.5, lactic acid 2.  Initially low blood pressure responded very well to 3 L of IV fluids resuscitation and he also started making urine.  Blood  pressures are stable now.  Heart rate is stable.  X-ray of the foot shows some soft tissue swelling, no foreign body, bones are intact.  Portable chest x-ray is essentially normal. Patient was given 3 L of normal saline, vancomycin and Zosyn after drawing blood cultures.  Urinalysis is normal.  Review of Systems: all systems are reviewed and pertinent positive as per HPI otherwise rest are negative.    Past Medical History:  Diagnosis Date  . Diabetes mellitus     History reviewed. No pertinent surgical history.   reports that he has never smoked. He has never used smokeless tobacco. He reports that he does not drink alcohol or use drugs.  Allergies  Allergen Reactions  . Basaglar [employer] [Insulin Glargine] Diarrhea  . Metformin And Related Diarrhea and Nausea And Vomiting  . Trulicity [Dulaglutide] Diarrhea    Family History  Problem Relation Age of Onset  . Hypertension Other   . Diabetes Other   . Healthy Mother      Prior to Admission medications   Medication Sig Start Date End Date Taking? Authorizing Provider  acetaminophen (TYLENOL) 500 MG tablet Take 500-1,000 mg by mouth every 6 (six) hours as needed for mild pain, fever or headache.    Yes [provider]  Continuous Blood Gluc Sensor (FREESTYLE LIBRE 14 DAY SENSOR) MISC Inject 1 patch into the skin every 14 (fourteen) days.   Yes [provider]  insulin aspart (NOVOLOG) 100 UNIT/ML injection Inject 0-15  Units into the skin 3 (three) times daily with meals. Patient taking differently: Inject 7-12 Units into the skin 3 (three) times daily as needed (before meals, if BGL is 250 or greater).  07/01/17  Yes Lonia Blood, MD  Insulin Glargine (LANTUS SOLOSTAR) 100 UNIT/ML Solostar Pen Inject 40 Units into the skin daily before breakfast.   Yes [provider]  Multiple Vitamins-Minerals (EMERGEN-C IMMUNE) PACK Take 1 packet by mouth daily as needed (for supplementation).   Yes [provider]  multivitamin (ONE-A-DAY MEN'S) TABS tablet Take 1 tablet by mouth daily with breakfast.   Yes [provider]  clindamycin (CLEOCIN) 150 MG capsule Take 2 capsules (300 mg total) by mouth every 6 (six) hours. 10/14/18   Renne Crigler, PA-C  Continuous Blood Gluc Sensor MISC 1 each by Does not apply route as directed. Use as directed every 14 days. May dispense FreeStyle [employer] or similar. Patient not taking: Reported on 02/03/2019 06/30/17   Darlin Drop, DO  Insulin Glargine (BASAGLAR KWIKPEN) 100 UNIT/ML SOPN Inject 0.24 mLs (24 Units total) into the skin at bedtime. Patient taking differently: Inject 40 Units into the skin every morning.  07/01/17   Lonia Blood, MD    Physical Exam: Vitals:   02/03/19 1510 02/03/19 1525 02/03/19 1600 02/03/19 1700  BP: 134/79  125/72 133/77  Pulse: 84  85 85  Resp: 16  20 18   Temp:      TempSrc:      SpO2: 99%  100% 98%  Weight:  117.9 kg    Height:  6\' 5"  (1.956 m)      Constitutional: NAD, calm, comfortable Vitals:   02/03/19 1510 02/03/19 1525 02/03/19 1600 02/03/19 1700  BP: 134/79  125/72 133/77  Pulse: 84  85 85  Resp: 16  20 18   Temp:      TempSrc:      SpO2: 99%  100% 98%  Weight:  117.9 kg    Height:  6\' 5"  (1.956 m)     Eyes: PERRL, lids and conjunctivae normal ENMT: Mucous membranes are moist. Posterior pharynx clear of any exudate or lesions.Normal dentition.  Neck: normal, supple, no masses, no thyromegaly Respiratory: clear to auscultation bilaterally, no wheezing, no crackles. Normal respiratory effort. No accessory muscle use.  No added sound.  Oropharynx is clear without exudate. Cardiovascular: Regular rate and rhythm, no murmurs / rubs / gallops. No extremity edema. 2+ pedal pulses. No carotid bruits.  Abdomen: no tenderness, no masses palpated. No hepatosplenomegaly. Bowel sounds positive.  Musculoskeletal: no clubbing / cyanosis. No joint deformity upper and lower extremities.  Good ROM, no contractures. Normal muscle tone.  Skin: no rashes, lesions, ulcers. No induration Neurologic: CN 2-12 grossly intact. Sensation intact, DTR normal. Strength 5/5 in all 4.  Psychiatric: Normal judgment and insight. Alert and oriented x 3. Normal mood.  Left foot: 2 x 2 chronic looking granulated margins, tendons exposed, draining purulent material, no surrounding fluctuation or induration, nontender.  Peripheral pulses are adequate.   Labs on Admission: I have personally reviewed following labs and imaging studies  CBC: Recent Labs  Lab 02/03/19 1258 02/03/19 1412 02/03/19 1618  WBC 4.2 3.5*  --   NEUTROABS  --  2.5  --   HGB 13.5 13.4 16.0  HCT 42.3 41.4 47.0  MCV 84.9 84.5  --   PLT 157 168  --    Basic Metabolic Panel: Recent Labs  Lab 02/03/19 1258 02/03/19 1412 02/03/19  1618  NA 129* 128* 131*  K 5.1 5.9* 4.5  CL 92* 93* 91*  CO2 27 25  --   GLUCOSE 411* 401* 403*  BUN 15 15 19   CREATININE 1.39* 1.50* 1.30*  CALCIUM 9.0 8.9  --    GFR: Estimated Creatinine Clearance: 116.1 mL/min (A) (by C-G formula based on SCr of 1.3 mg/dL (H)). Liver Function Tests: Recent Labs  Lab 02/03/19 1412  AST 25  ALT 39  ALKPHOS 82  BILITOT 0.5  PROT 7.3  ALBUMIN 3.4*   No results for input(s): LIPASE, AMYLASE in the last 168 hours. No results for input(s): AMMONIA in the last 168 hours. Coagulation Profile: Recent Labs  Lab 02/03/19 1523  INR 1.1   Cardiac Enzymes: No results for input(s): CKTOTAL, CKMB, CKMBINDEX, TROPONINI in the last 168 hours. BNP (last 3 results) No results for input(s): PROBNP in the last 8760 hours. HbA1C: No results for input(s): HGBA1C in the last 72 hours. CBG: No results for input(s): GLUCAP in the last 168 hours. Lipid Profile: No results for input(s): CHOL, HDL, LDLCALC, TRIG, CHOLHDL, LDLDIRECT in the last 72 hours. Thyroid Function Tests: No results for input(s): TSH, T4TOTAL, FREET4, T3FREE, THYROIDAB in the last 72  hours. Anemia Panel: No results for input(s): VITAMINB12, FOLATE, FERRITIN, TIBC, IRON, RETICCTPCT in the last 72 hours. Urine analysis:    Component Value Date/Time   COLORURINE YELLOW 02/02/2018 1357   APPEARANCEUR CLEAR 02/02/2018 1357   LABSPEC 1.027 02/02/2018 1357   PHURINE 5.0 02/02/2018 1357   GLUCOSEU >=500 (A) 02/02/2018 1357   HGBUR NEGATIVE 02/02/2018 1357   BILIRUBINUR NEGATIVE 02/02/2018 1357   KETONESUR NEGATIVE 02/02/2018 1357   PROTEINUR NEGATIVE 02/02/2018 1357   UROBILINOGEN 0.2 11/07/2011 2241   NITRITE NEGATIVE 02/02/2018 1357   LEUKOCYTESUR NEGATIVE 02/02/2018 1357    Radiological Exams on Admission: Dg Chest Portable 1 View  Result Date: 02/03/2019 CLINICAL DATA:  Fever. EXAM: PORTABLE CHEST 1 VIEW COMPARISON:  June 28, 2017 FINDINGS: Evaluation of the cardiac silhouette is limited due to the low volume portable technique. The cardiac silhouette does appear little more prominent the interval. The hila and mediastinum are unchanged. No pneumothorax. Mildly coarsened lung markings bilaterally. No focal infiltrate. No other acute abnormalities. IMPRESSION: 1. Coarsened lung markings bilaterally suggests bronchitic change or atypical infection. No focal infiltrate. 2. Increased prominence of the cardiac shadow may be due to the low volume portable technique. A PA and lateral chest x-ray could better evaluate if clinically warranted. Electronically Signed   By: Gerome Samavid  Williams III M.D   On: 02/03/2019 15:28   Dg Foot 2 Views Left  Result Date: 02/03/2019 CLINICAL DATA:  Evaluate for osteomyelitis. EXAM: LEFT FOOT - 2 VIEW COMPARISON:  None. FINDINGS: A wound is seen in the lateral aspect of the foot adjacent to the fifth MTP joint. No fractures. No convincing evidence of bony erosion. IMPRESSION: Deep wound adjacent to the fifth MTP joint. No convincing evidence of bony erosion to suggest osteomyelitis. High attenuation along the skin surface is likely on the skin and  not a foreign body. Recommend clinical correlation. Electronically Signed   By: Gerome Samavid  Williams III M.D   On: 02/03/2019 15:30    EKG: Independently reviewed.  Sinus rhythm.  No acute ST-T wave changes.  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  Sepsis present on admission: Primary source unknown. Left foot wound with some pus draining, however on clinical examination does not look like contributing to severe sepsis.   Agree with admission given severity of symptoms.  Blood cultures were drawn.  Will draw local wound culture. We will check MRI of the foot, depends upon findings, will consult podiatry surgery in the morning. Given uncontrolled diabetes and sepsis , reasonable to continue broad-spectrum antibiotics until clinical stabilization including vancomycin, cefepime and Flagyl. COVID-19 test is pending. Received adequate fluid resuscitation, will check lactic acid to ensure stabilization. Blood pressures are adequate.  Can be admitted to medical telemetry.  2.  Insulin-dependent type 2 diabetes with hyperglycemia: His blood sugars are more than 400.  Severe dehydration on clinical examination.  Anion gap is normal.  Patient currently able to eat. Continue IV fluids.  Diabetic diet.  Will resume home insulin regimen, start Levemir twice a day first dose now after dinner, sliding scale insulin.  Will check A1c.  3.  Acute kidney injury: Aggressively resuscitated.  As above.  Recheck in the morning.  Intake and output monitoring.  4.  Diabetic foot ulcer: As above.  MRI.   DVT prophylaxis: Lovenox subcu Code Status: Full code Family Communication: None Disposition Plan: Ultimately home after stabilization Consults called: None Admission status: Inpatient telemetry.  Patient has symptoms of  severe sepsis including leukopenia with WBC count less than 4000, lactic acid of 2, initial hypotension.  He is needing aggressive antibiotics, fluids and investigations.  Patient will need more than 2 midnights hospitalization.   Dorcas Carrow MD [facility] Pager [phone]

## 2019-02-03 NOTE — ED Triage Notes (Addendum)
C/O temp up to 102, body aches, HA, vomiting and diarrhea x 2 days.  Initially had cough, but states has resolved.  C/O dyspnea.  Had Tylenol @ 0900 this AM; states able to keep down some PO fluids and Tyl.  CBG =287 @ approx 1000 today.  Has not taken insulin.  Pt currently being treated at [facility] for foot wound.

## 2019-02-03 NOTE — Progress Notes (Addendum)
Pharmacy Antibiotic Note  [name] is a 33 y.o. male admitted on 02/03/2019 with sepsis.  Pharmacy consulted 11/1 for vancomycin and cefepime dosing.   Presented 11/1 with fever, vomiting, and cough for a couple days. Noted Left foot wound with some pus draining. WBC 3.5>2.3, LA 2>1.8 Tc 100.2, Tmax 102.5, LA 1.5>2 SCr 1.3 > down to 1.02 today,  (CrCl >100 mL/min).  11/1 COVID test:  Positive Blood, urine and would cultures pending Vancomycin and Cefepime started 02/03/19,  Flagyl added.  Plan: Vancomycin dose adjusted to 1500 mg IV every 12 hours  (estAUC450, SCR 1.02,  Vd 0.5 d/t BMI >30)  Continue Cefepime 2 g IV every 8 hours Monitor renal fx, cx results, clinical pic, and vanc levels as indicated  Height: 6\' 5"  (195.6 cm) Weight: 260 lb (117.9 kg) IBW/kg (Calculated) : 89.1  Temp (24hrs), Avg:101.6 F (38.7 C), Min:100.7 F (38.2 C), Max:102.5 F (39.2 C)  Recent Labs  Lab 02/03/19 1258 02/03/19 1412 02/03/19 1604 02/03/19 1618  WBC 4.2 3.5*  --   --   CREATININE 1.39* 1.50*  --  1.30*  LATICACIDVEN  --  1.5 2.0*  --     Estimated Creatinine Clearance: 116.1 mL/min (A) (by C-G formula based on SCr of 1.3 mg/dL (H)).    Allergies  Allergen Reactions  . Basaglar [employer] [Insulin Glargine] Diarrhea  . Metformin And Related Diarrhea and Nausea And Vomiting  . Trulicity [Dulaglutide] Diarrhea    Antimicrobials this admission: Vancomycin 11/1 >>  Cefepime 11/1 >>  Zosyn 11/1 x1 Flagyl 11/2>>  Dose adjustments this admission: N/A  Microbiology results:  1/1 BCx: NGTD 11/1 UCx: sent  11/1 Wound: sent  11/2 Ucx : sent 11/2 aerobic cx Wound rt foot: sent 11/1 COVID: detected  Thank you for allowing pharmacy to be a part of this patient's care.  Nicole Cella, RPh Clinical Pharmacist Please check AMION for all [place] phone numbers After 10:00 PM, call [place] [phone] 02/03/2019 6:00 PM

## 2019-02-03 NOTE — ED Triage Notes (Signed)
Pt from [facility] for fever.  Pt denies pain to this RN.  However, per [facility] pt with body aches, headache, vomiting, and diarrhea x 2 days.  Pt requesting water and states he hasn't ate in 2 days.  Pt being treated at [place] for foot wound and is diabetic.  Hypotensive at triage with fever.  Charge RN notified of need for treatment room.

## 2019-02-03 NOTE — Progress Notes (Signed)
Notified bedside nurse of need to administer antibiotics.  

## 2019-02-03 NOTE — ED Notes (Signed)
Patient is being discharged from the Urgent [place] and sent to the Emergency Department via wheelchair by staff. Per Dr. Lanny Cramp, patient is stable but in need of higher level of care due to possible Covid infection with chronic health problems and possible DKA.  Labs have been drawn at [facility]. Patient is aware and verbalizes understanding of plan of care.  Vitals:   02/03/19 1254  BP: 108/65  Pulse: 89  Resp: 18  Temp: (!) 100.7 F (38.2 C)  SpO2: 100%

## 2019-02-03 NOTE — ED Provider Notes (Signed)
[name] EMERGENCY DEPARTMENT Provider Note   CSN: [id number] Arrival date & time: 02/03/19  1326     History   Chief Complaint Chief Complaint  Patient presents with  . Fever    HPI [name] is a 33 y.o. male.     Patient complains of fever vomiting cough for couple days.  The history is provided by the patient. No language interpreter was used.  Emesis Severity:  Moderate Timing:  Constant Quality:  Bilious material Able to tolerate:  Liquids Progression:  Worsening Chronicity:  New Recent urination:  Normal Context: not post-tussive   Relieved by:  Nothing Associated symptoms: cough   Associated symptoms: no abdominal pain, no diarrhea and no headaches     Past Medical History:  Diagnosis Date  . Diabetes mellitus     Patient Active Problem List   Diagnosis Date Noted  . Sepsis (HCC) 02/03/2019  . CAP (community acquired pneumonia) 06/28/2017  . 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 Hyperglycemia without ketosis 01/19/2013    History reviewed. No pertinent surgical history.      Home Medications    Prior to Admission medications   Medication Sig Start Date End Date Taking? Authorizing Provider  cholecalciferol (VITAMIN D) 1000 units tablet Take 1,000 Units by mouth daily.    [provider]  clindamycin (CLEOCIN) 150 MG capsule Take 2 capsules (300 mg total) by mouth every 6 (six) hours. 10/14/18   Renne Crigler, PA-C  Continuous Blood Gluc Sensor MISC 1 each by Does not apply route as directed. Use as directed every 14 days. May dispense FreeStyle [employer] or similar. 06/30/17   Darlin Drop, DO  insulin aspart (NOVOLOG) 100 UNIT/ML injection Inject 0-15 Units into the skin 3 (three) times daily with meals. 07/01/17   Lonia Blood, MD  Insulin Glargine (BASAGLAR KWIKPEN) 100 UNIT/ML SOPN Inject 0.24 mLs (24 Units total) into the skin at bedtime. Patient taking  differently: Inject 40 Units into the skin every morning.  07/01/17   Lonia Blood, MD  Omega-3 Fatty Acids (FISH OIL) 1000 MG CAPS Take 1 capsule by mouth daily.    [provider]    Family History Family History  Problem Relation Age of Onset  . Hypertension Other   . Diabetes Other   . Healthy Mother     Social History Social History   Tobacco Use  . Smoking status: Never Smoker  . Smokeless tobacco: Never Used  Substance Use Topics  . Alcohol use: No  . Drug use: No     Allergies   Metformin and related   Review of Systems Review of Systems  Constitutional: Negative for appetite change and fatigue.  HENT: Negative for congestion, ear discharge and sinus pressure.   Eyes: Negative for discharge.  Respiratory: Positive for cough.   Cardiovascular: Negative for chest pain.  Gastrointestinal: Positive for vomiting. Negative for abdominal pain and diarrhea.  Genitourinary: Negative for frequency and hematuria.  Musculoskeletal: Negative for back pain.  Skin: Negative for rash.  Neurological: Negative for seizures and headaches.  Psychiatric/Behavioral: Negative for hallucinations.     Physical Exam Updated Vital Signs BP 134/79   Pulse 84   Temp (!) 102.5 F (39.2 C) (Oral)   Resp 16   Ht 6\' 5"  (1.956 m)   Wt 117.9 kg   SpO2 99%   BMI 30.83 kg/m   Physical Exam Vitals signs and nursing note reviewed.  Constitutional:      Appearance: He is well-developed.  HENT:     Head: Normocephalic.     Nose: Nose normal.     Mouth/Throat:     Comments: Dry mucous membrane Eyes:     General: No scleral icterus.    Conjunctiva/sclera: Conjunctivae normal.  Neck:     Musculoskeletal: Neck supple.     Thyroid: No thyromegaly.  Cardiovascular:     Rate and Rhythm: Normal rate and regular rhythm.     Heart sounds: No murmur. No friction rub. No gallop.   Pulmonary:     Breath sounds: No stridor. No wheezing or rales.  Chest:     Chest wall: No  tenderness.  Abdominal:     General: There is no distension.     Tenderness: There is no abdominal tenderness. There is no rebound.  Musculoskeletal: Normal range of motion.  Lymphadenopathy:     Cervical: No cervical adenopathy.  Skin:    Findings: No erythema or rash.     Comments: Diabetic ulcer left heel  Neurological:     Mental Status: He is alert and oriented to person, place, and time.     Motor: No abnormal muscle tone.     Coordination: Coordination normal.  Psychiatric:        Behavior: Behavior normal.      ED Treatments / Results  Labs (all labs ordered are listed, but only abnormal results are displayed) Labs Reviewed  COMPREHENSIVE METABOLIC PANEL - Abnormal; Notable for the following components:      Result Value   Sodium 128 (*)    Potassium 5.9 (*)    Chloride 93 (*)    Glucose, Bld 401 (*)    Creatinine, Ser 1.50 (*)    Albumin 3.4 (*)    All other components within normal limits  CBC WITH DIFFERENTIAL/PLATELET - Abnormal; Notable for the following components:   WBC 3.5 (*)    Lymphs Abs 0.6 (*)    All other components within normal limits  I-STAT CHEM 8, ED - Abnormal; Notable for the following components:   Sodium 131 (*)    Chloride 91 (*)    Creatinine, Ser 1.30 (*)    Glucose, Bld 403 (*)    All other components within normal limits  CULTURE, BLOOD (ROUTINE X 2)  CULTURE, BLOOD (ROUTINE X 2)  URINE CULTURE  SARS CORONAVIRUS 2 (TAT 6-24 HRS)  LACTIC ACID, PLASMA  APTT  PROTIME-INR  LACTIC ACID, PLASMA  URINALYSIS, ROUTINE W REFLEX MICROSCOPIC  URINALYSIS, ROUTINE W REFLEX MICROSCOPIC  HEMOGLOBIN A1C    EKG None  Radiology Dg Chest Portable 1 View  Result Date: 02/03/2019 CLINICAL DATA:  Fever. EXAM: PORTABLE CHEST 1 VIEW COMPARISON:  June 28, 2017 FINDINGS: Evaluation of the cardiac silhouette is limited due to the low volume portable technique. The cardiac silhouette does appear little more prominent the interval. The hila and  mediastinum are unchanged. No pneumothorax. Mildly coarsened lung markings bilaterally. No focal infiltrate. No other acute abnormalities. IMPRESSION: 1. Coarsened lung markings bilaterally suggests bronchitic change or atypical infection. No focal infiltrate. 2. Increased prominence of the cardiac shadow may be due to the low volume portable technique. A PA and lateral chest x-ray could better evaluate if clinically warranted. Electronically Signed   By: Dorise Bullion III M.D   On: 02/03/2019 15:28   Dg Foot 2 Views Left  Result Date: 02/03/2019 CLINICAL DATA:  Evaluate for osteomyelitis. EXAM: LEFT FOOT - 2 VIEW  COMPARISON:  None. FINDINGS: A wound is seen in the lateral aspect of the foot adjacent to the fifth MTP joint. No fractures. No convincing evidence of bony erosion. IMPRESSION: Deep wound adjacent to the fifth MTP joint. No convincing evidence of bony erosion to suggest osteomyelitis. High attenuation along the skin surface is likely on the skin and not a foreign body. Recommend clinical correlation. Electronically Signed   By: David  Williams III M.D   On: 02/03/2019 15:30    ProcedurGerome Sames Procedures (including critical care time)  Medications Ordered in ED Medications  sodium chloride flush (NS) 0.9 % injection 3 mL (has no administration in time range)  acetaminophen (TYLENOL) tablet 650 mg (has no administration in time range)  vancomycin (VANCOCIN) 2,000 mg in sodium chloride 0.9 % 500 mL IVPB (2,000 mg Intravenous New Bag/Given 02/03/19 1544)  insulin detemir (LEVEMIR) injection 15 Units (has no administration in time range)  insulin aspart (novoLOG) injection 0-15 Units (has no administration in time range)  insulin aspart (novoLOG) injection 0-5 Units (has no administration in time range)  sodium chloride 0.9 % bolus 1,000 mL (1,000 mLs Intravenous New Bag/Given 02/03/19 1547)  sodium chloride 0.9 % bolus 2,000 mL (2,000 mLs Intravenous New Bag/Given 02/03/19 1549)   piperacillin-tazobactam (ZOSYN) IVPB 3.375 g (3.375 g Intravenous New Bag/Given 02/03/19 1550)  acetaminophen (TYLENOL) tablet 650 mg (650 mg Oral Given 02/03/19 1521)  ondansetron (ZOFRAN) injection 4 mg (4 mg Intravenous Given 02/03/19 1518)     Initial Impression / Assessment and Plan / ED Course  I have reviewed the triage vital signs and the nursing notes.  Pertinent labs & imaging results that were available during my care of the patient were reviewed by me and considered in my medical decision making (see chart for details).        Patient with poorly controlled diabetes and dehydration and AKI.  He will be admitted for fluids and Covid test pending Final Clinical Impressions(s) / ED Diagnoses   Final diagnoses:  AKI (acute kidney injury) [facility](HCC)    ED Discharge Orders    None       Bethann BerkshireZammit, Delva Derden, MD 02/03/19 1630

## 2019-02-04 ENCOUNTER — Inpatient Hospital Stay (HOSPITAL_COMMUNITY): Payer: BC Managed Care – PPO

## 2019-02-04 ENCOUNTER — Telehealth: Payer: Self-pay | Admitting: Podiatry

## 2019-02-04 ENCOUNTER — Telehealth (HOSPITAL_COMMUNITY): Payer: Self-pay | Admitting: Emergency Medicine

## 2019-02-04 DIAGNOSIS — U071 COVID-19: Secondary | ICD-10-CM | POA: Diagnosis present

## 2019-02-04 DIAGNOSIS — R739 Hyperglycemia, unspecified: Secondary | ICD-10-CM

## 2019-02-04 LAB — FERRITIN: Ferritin: 278 ng/mL (ref 24–336)

## 2019-02-04 LAB — CBC
HCT: 37 % — ABNORMAL LOW (ref 39.0–52.0)
Hemoglobin: 11.8 g/dL — ABNORMAL LOW (ref 13.0–17.0)
MCH: 27.1 pg (ref 26.0–34.0)
MCHC: 31.9 g/dL (ref 30.0–36.0)
MCV: 85.1 fL (ref 80.0–100.0)
Platelets: 130 10*3/uL — ABNORMAL LOW (ref 150–400)
RBC: 4.35 MIL/uL (ref 4.22–5.81)
RDW: 12.1 % (ref 11.5–15.5)
WBC: 2.3 10*3/uL — ABNORMAL LOW (ref 4.0–10.5)
nRBC: 0 % (ref 0.0–0.2)

## 2019-02-04 LAB — ABO/RH: ABO/RH(D): O POS

## 2019-02-04 LAB — TYPE AND SCREEN
ABO/RH(D): O POS
Antibody Screen: NEGATIVE

## 2019-02-04 LAB — HEPATIC FUNCTION PANEL
ALT: 28 U/L (ref 0–44)
AST: 26 U/L (ref 15–41)
Albumin: 2.9 g/dL — ABNORMAL LOW (ref 3.5–5.0)
Alkaline Phosphatase: 65 U/L (ref 38–126)
Bilirubin, Direct: <0.1 mg/dL (ref 0.0–0.2)
Total Bilirubin: 0.8 mg/dL (ref 0.3–1.2)
Total Protein: 6.4 g/dL — ABNORMAL LOW (ref 6.5–8.1)

## 2019-02-04 LAB — BASIC METABOLIC PANEL
Anion gap: 8 (ref 5–15)
BUN: 15 mg/dL (ref 6–20)
CO2: 25 mmol/L (ref 22–32)
Calcium: 8.2 mg/dL — ABNORMAL LOW (ref 8.9–10.3)
Chloride: 99 mmol/L (ref 98–111)
Creatinine, Ser: 1.02 mg/dL (ref 0.61–1.24)
GFR calc Af Amer: >60 mL/min (ref >60)
GFR calc non Af Amer: >60 mL/min (ref >60)
Glucose, Bld: 312 mg/dL — ABNORMAL HIGH (ref 70–99)
Potassium: 4.3 mmol/L (ref 3.5–5.1)
Sodium: 132 mmol/L — ABNORMAL LOW (ref 135–145)

## 2019-02-04 LAB — URINALYSIS, ROUTINE W REFLEX MICROSCOPIC
Bacteria, UA: NONE SEEN
Bilirubin Urine: NEGATIVE
Glucose, UA: >=500 mg/dL — AB
Hgb urine dipstick: NEGATIVE
Ketones, ur: 5 mg/dL — AB
Leukocytes,Ua: NEGATIVE
Nitrite: NEGATIVE
Protein, ur: 100 mg/dL — AB
Specific Gravity, Urine: 1.026 (ref 1.005–1.030)
pH: 5 (ref 5.0–8.0)

## 2019-02-04 LAB — NOVEL CORONAVIRUS, NAA (HOSP ORDER, SEND-OUT TO REF LAB; TAT 18-24 HRS): SARS-CoV-2, NAA: DETECTED — AB

## 2019-02-04 LAB — GLUCOSE, CAPILLARY
Glucose-Capillary: 334 mg/dL — ABNORMAL HIGH (ref 70–99)
Glucose-Capillary: 312 mg/dL — ABNORMAL HIGH (ref 70–99)
Glucose-Capillary: 286 mg/dL — ABNORMAL HIGH (ref 70–99)
Glucose-Capillary: 284 mg/dL — ABNORMAL HIGH (ref 70–99)
Glucose-Capillary: 209 mg/dL — ABNORMAL HIGH (ref 70–99)

## 2019-02-04 LAB — PROCALCITONIN: Procalcitonin: <0.1 ng/mL

## 2019-02-04 LAB — BRAIN NATRIURETIC PEPTIDE: B Natriuretic Peptide: 6.8 pg/mL (ref 0.0–100.0)

## 2019-02-04 LAB — FIBRINOGEN: Fibrinogen: 435 mg/dL (ref 210–475)

## 2019-02-04 LAB — PHOSPHORUS: Phosphorus: 3.1 mg/dL (ref 2.5–4.6)

## 2019-02-04 LAB — HIV ANTIBODY (ROUTINE TESTING W REFLEX): HIV Screen 4th Generation wRfx: NONREACTIVE

## 2019-02-04 LAB — C-REACTIVE PROTEIN: CRP: 3.7 mg/dL — ABNORMAL HIGH (ref <1.0)

## 2019-02-04 LAB — HEPATITIS B SURFACE ANTIGEN: Hepatitis B Surface Ag: NONREACTIVE

## 2019-02-04 LAB — D-DIMER, QUANTITATIVE: D-Dimer, Quant: 0.62 ug/mL-FEU — ABNORMAL HIGH (ref 0.00–0.50)

## 2019-02-04 LAB — MAGNESIUM: Magnesium: 1.9 mg/dL (ref 1.7–2.4)

## 2019-02-04 LAB — SARS CORONAVIRUS 2 (TAT 6-24 HRS): SARS Coronavirus 2: POSITIVE — AB

## 2019-02-04 LAB — LACTATE DEHYDROGENASE: LDH: 207 U/L — ABNORMAL HIGH (ref 98–192)

## 2019-02-04 MED ORDER — INSULIN ASPART 100 UNIT/ML ~~LOC~~ SOLN
10.0000 [IU] | Freq: Three times a day (TID) | SUBCUTANEOUS | Status: DC
  Administered 2019-02-04 – 2019-02-06 (×6): 10 [IU] via SUBCUTANEOUS

## 2019-02-04 MED ORDER — INSULIN DETEMIR 100 UNIT/ML ~~LOC~~ SOLN
20.0000 [IU] | Freq: Two times a day (BID) | SUBCUTANEOUS | Status: DC
  Administered 2019-02-04 – 2019-02-06 (×4): 20 [IU] via SUBCUTANEOUS
  Filled 2019-02-04 (×7): qty 0.2

## 2019-02-04 MED ORDER — GUAIFENESIN-DM 100-10 MG/5ML PO SYRP
10.0000 mL | ORAL_SOLUTION | ORAL | Status: DC | PRN
  Administered 2019-02-05: 10 mL via ORAL
  Filled 2019-02-04: qty 10

## 2019-02-04 MED ORDER — VANCOMYCIN HCL 10 G IV SOLR
1500.0000 mg | Freq: Two times a day (BID) | INTRAVENOUS | Status: DC
  Administered 2019-02-04 – 2019-02-06 (×4): 1500 mg via INTRAVENOUS
  Filled 2019-02-04 (×7): qty 1500

## 2019-02-04 MED ORDER — ACETAMINOPHEN 325 MG PO TABS
650.0000 mg | ORAL_TABLET | Freq: Four times a day (QID) | ORAL | Status: AC | PRN
  Administered 2019-02-04: 650 mg via ORAL
  Filled 2019-02-04: qty 2

## 2019-02-04 NOTE — Telephone Encounter (Signed)
Patient is currently admitted to 5W in the hospital, contacted the charge nurse and Delana Meyer, nurse taking care of the patient to notify them that he is positive for covid.

## 2019-02-04 NOTE — Progress Notes (Signed)
PROGRESS NOTE    Max Muzquiz  ZOX:[id number] DOB: [birth year] DOA: 02/03/2019 PCP: [name], MD    Brief Narrative:  [name] is a 33 y.o. male with medical history significant of insulin-dependent type 2 diabetes, chronic left diabetic foot ulcer presented to urgent care center today with temperature of 100.7, dry cough, generalized fatigue and body ache for at least last 3 days.  According to the patient, he had not been feeling well since about a week, he has poor appetite.  Since last 3 days, he had multiple episodes where he had vomiting with some epigastric pain, occasional loose stool that is mostly watery.  Denies any congestion, sputum production.  He occasionally have cough however denies any other pulmonary symptoms including shortness of breath or wheezing.  No chest pain.  His main symptoms are feeling fatigued and weak and dizzy that led to fall this morning.  No sick contacts.  Denies any COVID-19 exposure at home or at work. Patient does have a diabetic foot ulcer on his left fifth metatarsal head, has seen different providers, recently seen at wound care clinic and plan for MRI for with he had his diabetic sensor off and was a scheduled for MRI next week. Because of being sick, he has not been using his insulin, last use was 3 days ago.  His urine output has decreased and urine looks dark. In the urgent care , he was found with temperature 102.5, blood pressures 92/50.  Covid 19 swab was collected and he was sent to ER.  In the emergency room, WBC count less than 4000, lactic acid 2.  Blood pressure responded to IV fluids.  X-ray showed soft tissue swelling no obvious bony involvement.  Portable chest x-ray was essentially normal.  Patient was on room air.  Patient was given broad-spectrum antibiotics after drawing blood cultures.   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  Retrospectively his COVID-19 infection might have been contributing to nausea and diarrhea with dehydration. 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 Called and discussed case with podiatry, Dr. Logan Bores, he will see patient in consultation to see whether patient will need debridement on this admission or home with oral antibiotics and follow-up debridement after COVID-19 symptoms improved.  COVID-19 virus infection: Patient is on room air.  Has fatigue and 1 or 2 loose bowel movement a day.  No cough.  No wheezing.  Will continue on all precautions including airborne isolation.  Currently no indication to start on any antiviral therapies or steroids.  Will check inflammatory markers, LFTs, chest x-ray in the morning or as needed if he develops shortness of breath.  Insulin-dependent type 2 diabetes with hyperglycemia: A1c 9.  Presented with blood sugars of more than 400.  Severe dehydration on clinical examination.  Currently eating regular diet.  Nausea improved.  Increase dose of Levemir to 20 units twice a day, start on prandial insulin.  Patient does use insulin at home, may need to uptitrate depends upon needed in the hospital.  Acute kidney injury: Resolved with IV fluids.  Decrease further IV fluids.  Patient with COVID-19 infection and generalized symptoms, no respiratory symptoms today.  Will stay in isolation unit, decide whether patient will need any surgical intervention.  If patient develops any respiratory symptoms and does not need any surgical intervention, will transfer to [facility],  otherwise if no surgical intervention planned, he may probably go home.  DVT prophylaxis: Lovenox subcu Code Status: Full code Family Communication: None Disposition Plan: Stay in the hospital   Consultants:   Podiatry  Procedures:   None  Antimicrobials:   Vancomycin, cefepime 02/03/2019-  Flagyl, 02/03/2019-02/04/2019   Subjective: Patient seen and examined.  Overnight no nausea vomiting.  No cough.  Did have fever 102.  Has some headache on posterior knuckle area and right side of the head.  He was able to eat full meal. Had one episode of loose watery diarrhea overnight. Denies any pain in his foot. Came back from MRI.  Objective: Vitals:   02/03/19 1959 02/04/19 0444 02/04/19 0800 02/04/19 1317  BP: 105/65 125/73 (!) 154/91   Pulse: 89 84 90   Resp: 18 18 18    Temp: (!) 100.4 F (38 C) 100.2 F (37.9 C) (!) 101.5 F (38.6 C) 100.2 F (37.9 C)  TempSrc: Oral Oral Oral   SpO2: 98% 98% 98%   Weight:      Height:        Intake/Output Summary (Last 24 hours) at 02/04/2019 1501 Last data filed at 02/04/2019 0900 Gross per 24 hour  Intake 3350.99 ml  Output 700 ml  Net 2650.99 ml   Filed Weights   02/03/19 1400 02/03/19 1525  Weight: 117.9 kg 117.9 kg    Examination:  General exam: Appears calm and comfortable, on room air. Respiratory system: Clear to auscultation. Respiratory effort normal.  No added sounds. Cardiovascular system: S1 & S2 heard, RRR. No JVD, murmurs, rubs, gallops or clicks. No pedal edema. Gastrointestinal system: Abdomen is nondistended, soft and nontender. No organomegaly or masses felt. Normal bowel sounds heard. Central nervous system: Alert and oriented. No focal neurological deficits. Extremities: Symmetric 5 x 5 power. Skin: No rashes, lesions Psychiatry: Judgement and insight appear normal. Mood & affect appropriate.         Data Reviewed: I have personally reviewed following labs and imaging studies  CBC: Recent Labs   Lab 02/03/19 1258 02/03/19 1412 02/03/19 1618 02/04/19 0220  WBC 4.2 3.5*  --  2.3*  NEUTROABS  --  2.5  --   --   HGB 13.5 13.4 16.0 11.8*  HCT 42.3 41.4 47.0 37.0*  MCV 84.9 84.5  --  85.1  PLT 157 168  --  809*   Basic Metabolic Panel: Recent Labs  Lab 02/03/19 1258 02/03/19 1412 02/03/19 1618 02/04/19 0220  NA 129* 128* 131* 132*  K 5.1 5.9* 4.5 4.3  CL 92* 93* 91* 99  CO2 27 25  --  25  GLUCOSE 411* 401* 403* 312*  BUN 15 15 19 15   CREATININE 1.39* 1.50* 1.30* 1.02  CALCIUM 9.0 8.9  --  8.2*  MG  --   --   --  1.9  PHOS  --   --   --  3.1   GFR: Estimated Creatinine Clearance: 147.9 mL/min (by C-G formula based on SCr of 1.02 mg/dL). Liver Function Tests: Recent Labs  Lab 02/03/19 1412  AST 25  ALT 39  ALKPHOS 82  BILITOT 0.5  PROT 7.3  ALBUMIN 3.4*   No results for  input(s): LIPASE, AMYLASE in the last 168 hours. No results for input(s): AMMONIA in the last 168 hours. Coagulation Profile: Recent Labs  Lab 02/03/19 1523  INR 1.1   Cardiac Enzymes: No results for input(s): CKTOTAL, CKMB, CKMBINDEX, TROPONINI in the last 168 hours. BNP (last 3 results) No results for input(s): PROBNP in the last 8760 hours. HbA1C: Recent Labs    02/03/19 1412  HGBA1C 9.9*   CBG: Recent Labs  Lab 02/03/19 1828 02/03/19 2001 02/04/19 0833 02/04/19 1231  GLUCAP 355* 334* 312* 286*   Lipid Profile: No results for input(s): CHOL, HDL, LDLCALC, TRIG, CHOLHDL, LDLDIRECT in the last 72 hours. Thyroid Function Tests: No results for input(s): TSH, T4TOTAL, FREET4, T3FREE, THYROIDAB in the last 72 hours. Anemia Panel: No results for input(s): VITAMINB12, FOLATE, FERRITIN, TIBC, IRON, RETICCTPCT in the last 72 hours. Sepsis Labs: Recent Labs  Lab 02/03/19 1412 02/03/19 1604 02/03/19 2034  LATICACIDVEN 1.5 2.0* 1.8    Recent Results (from the past 240 hour(s))  Novel Coronavirus, NAA (Hosp order, Send-out to Ref Lab; TAT 18-24 hrs     Status: Abnormal    Collection Time: 02/03/19  1:01 PM   Specimen: Nasopharyngeal Swab; Respiratory  Result Value Ref Range Status   SARS-CoV-2, NAA DETECTED (A) NOT DETECTED Final    Comment: RESULT CALLED TO, READ BACK BY AND VERIFIED WITH: J. [name], RN AT 1220 ON 02/04/19 BY C. JESSUP, MT. (NOTE)                  Client Requested Flag This nucleic acid amplification test was developed and its performance characteristics determined by [facility]. Nucleic acid amplification tests include PCR and TMA. This test has not been FDA cleared or approved. This test has been authorized by FDA under an Emergency Use Authorization (EUA). This test is only authorized for the duration of time the declaration that circumstances exist justifying the authorization of the emergency use of in vitro diagnostic tests for detection of SARS-CoV-2 virus and/or diagnosis of COVID-19 infection under section 564(b)(1) of the Act, 21 U.S.C. 161WRU-0(A360bbb-3(b) (1), unless the authorization is terminated or revoked sooner. When diagnostic testing is negative, the possibility of a false negative result should be considered in the context of a patient's recent exposures and the presenc e of clinical signs and symptoms consistent with COVID-19. An individual without symptoms of COVID- 19 and who is not shedding SARS-CoV-2 virus would expect to have a negative (not detected) result in this assay. Performed At: [facility] [address] [place], [place] [id number] Jolene SchimkeNagendra Sanjai MD YN:[id number]:[phone]    Coronavirus Source NASOPHARYNGEAL  Final    Comment: Performed at [facility], [address] [place], [place] [address]  Blood Culture (routine x 2)     Status: None (Preliminary result)   Collection Time: 02/03/19  3:53 PM   Specimen: BLOOD  Result Value Ref Range Status   Specimen Description BLOOD LEFT ANTECUBITAL  Final   Special Requests   Final    BOTTLES DRAWN AEROBIC AND ANAEROBIC Blood Culture adequate  volume   Culture   Final    NO GROWTH < 24 HOURS Performed at [facility], [address] [place], [place] [address]    Report Status PENDING  Incomplete  Blood Culture (routine x 2)     Status: None (Preliminary result)   Collection Time: 02/03/19  4:11 PM   Specimen: BLOOD  Result Value Ref Range Status   Specimen Description BLOOD  RIGHT ANTECUBITAL  Final   Special Requests   Final    BOTTLES DRAWN AEROBIC AND ANAEROBIC Blood Culture adequate volume   Culture   Final    NO GROWTH < 24 HOURS Performed at [facility], [address] [place], [place] [address]    Report Status PENDING  Incomplete  Aerobic Culture (superficial specimen)     Status: None (Preliminary result)   Collection Time: 02/04/19  8:28 AM   Specimen: Foot; Wound  Result Value Ref Range Status   Specimen Description FOOT LEFT  Final   Special Requests SWAB SPEC  Final   Gram Stain   Final    FEW WBC PRESENT,BOTH PMN AND MONONUCLEAR RARE GRAM POSITIVE COCCI IN PAIRS Performed at [facility], [address] [place], [place] [address]    Culture PENDING  Incomplete   Report Status PENDING  Incomplete         Radiology Studies: Mr Foot Left Wo Contrast  Result Date: 02/04/2019 CLINICAL DATA:  Diabetic foot ulcer. Open wound along the lateral aspect of the forefoot at the level of the fifth MTP joint. EXAM: MRI OF THE LEFT FOOT WITHOUT CONTRAST TECHNIQUE: Multiplanar, multisequence MR imaging of the left foot was performed. No intravenous contrast was administered. COMPARISON:  Radiographs 02/03/2019 FINDINGS: Diffuse subcutaneous soft tissue swelling/edema/fluid mainly along the dorsum and lateral aspect of the forefoot extending back into the midfoot. Findings consistent with cellulitis. Is also a elongated fluid collection tracking back along the lateral margin of the fifth metatarsal worrisome for abscess. There is an open wound noted along the plantar aspect of the foot at the level  of the fifth metatarsal head. Diffuse myofasciitis without definite findings for pyomyositis. Diffuse abnormal T1 and T2 signal intensity in the fifth metatarsal head and proximal phalanx consistent with osteomyelitis and septic arthritis at the fifth MTP joint. No other definite areas of osteomyelitis are identified. IMPRESSION: 1. Septic arthritis at the fifth MTP joint and osteomyelitis involving the fifth metatarsal head and proximal phalanx. There is an overlying plantar open wound and abscess tracking back along the lateral aspect of the fifth metatarsal shaft. 2. Diffuse cellulitis and myofasciitis without findings for pyomyositis. Electronically Signed   By: Rudie Meyer M.D.   On: 02/04/2019 08:09   Dg Chest Portable 1 View  Result Date: 02/03/2019 CLINICAL DATA:  Fever. EXAM: PORTABLE CHEST 1 VIEW COMPARISON:  June 28, 2017 FINDINGS: Evaluation of the cardiac silhouette is limited due to the low volume portable technique. The cardiac silhouette does appear little more prominent the interval. The hila and mediastinum are unchanged. No pneumothorax. Mildly coarsened lung markings bilaterally. No focal infiltrate. No other acute abnormalities. IMPRESSION: 1. Coarsened lung markings bilaterally suggests bronchitic change or atypical infection. No focal infiltrate. 2. Increased prominence of the cardiac shadow may be due to the low volume portable technique. A PA and lateral chest x-ray could better evaluate if clinically warranted. Electronically Signed   By: Gerome Sam III M.D   On: 02/03/2019 15:28   Dg Foot 2 Views Left  Result Date: 02/03/2019 CLINICAL DATA:  Evaluate for osteomyelitis. EXAM: LEFT FOOT - 2 VIEW COMPARISON:  None. FINDINGS: A wound is seen in the lateral aspect of the foot adjacent to the fifth MTP joint. No fractures. No convincing evidence of bony erosion. IMPRESSION: Deep wound adjacent to the fifth MTP joint. No convincing evidence of bony erosion to suggest  osteomyelitis. High attenuation along the skin surface is  likely on the skin and not a foreign body. Recommend clinical correlation. Electronically Signed   By: Gerome Sam III M.D   On: 02/03/2019 15:30        Scheduled Meds: . heparin  5,000 Units Subcutaneous Q8H  . insulin aspart  0-15 Units Subcutaneous TID WC  . insulin aspart  0-5 Units Subcutaneous QHS  . insulin aspart  10 Units Subcutaneous TID WC  . insulin detemir  20 Units Subcutaneous BID  . sodium chloride flush  3 mL Intravenous Once   Continuous Infusions: . ceFEPime (MAXIPIME) IV 2 g (02/04/19 1240)  . vancomycin 1,250 mg (02/04/19 0532)  . vancomycin       LOS: 1 day    Time spent: 35 minutes    Dorcas Carrow, MD [facility] Pager [phone]

## 2019-02-04 NOTE — Telephone Encounter (Signed)
Dr. Shirlean Kelly from hospital called about patient [name] dob [birth year] please call the Dr [phone]

## 2019-02-04 NOTE — Progress Notes (Addendum)
Inpatient Diabetes Program Recommendations  AACE/ADA: New Consensus Statement on Inpatient Glycemic Control (2015)  Target Ranges:  Prepandial:   less than 140 mg/dL      Peak postprandial:   less than 180 mg/dL (1-2 hours)      Critically ill patients:  140 - 180 mg/dL   Results for [name], Max (MRN [id number]) as of 02/04/2019 10:37  Ref. Range 02/03/2019 18:28 02/03/2019 20:01 02/04/2019 08:33  Glucose-Capillary Latest Ref Range: 70 - 99 mg/dL 355 (H)  15 units NOVOLOG  334 (H)  5 units NOVOLOG + 15 units LEVEMIR given at 12am 312 (H)  11 units NOVOLOG +  15 units LEVEMIR   Results for [name], Max (MRN [id number]) as of 02/04/2019 10:37  Ref. Range 06/28/2017 13:07 02/03/2019 14:12  Hemoglobin A1C Latest Ref Range: 4.8 - 5.6 % 14.0 (H) 9.9 (H)  (237 mg/dl)     Admit Sepsis--Primary source unknown--L foot wound with some pus draining, however on clinical examination does not look like contributing to severe sepsis  History: DM   Home DM Meds: Basaglar 40 units Daily        Novolog 7-12 units TID if CBG >250   Current Orders: Levemir 15 units BID       Novolog Moderate Correction Scale/ SSI (0-15 units) TID AC + HS  Allergies State: Basaglar, Metformin, Trulicity?   PCP: Dr. Delfina Redwood     Novolog started yest at [place] started at [facility]. Next dose Levemir on board at [place] today.   Current A1c= 9.9%.    MD- Please consider the following in-hospital insulin adjustments:  Since patient eating 100% meal this AM, may also consider adding Novolog Meal Coverage to Current inpatient insulin regimen:  Novolog 6 units TID with meals  (Please add the following Hold Parameters: Hold if pt eats <50% of meal, Hold if pt NPO)     --Will follow patient during hospitalization--  Wyn Quaker RN, MSN, CDE Diabetes Coordinator Inpatient Glycemic Control Team Team Pager: [phone] (8a-5p)

## 2019-02-04 NOTE — Progress Notes (Signed)
Patient transferred to 2W-36. A&O x 4 and stable at time of transfer.  Report called to Zadie Cleverly., RN by Delana Meyer, RN.

## 2019-02-04 NOTE — Progress Notes (Signed)
[name], Max ([id number]) Visit Report for 01/31/2019 HPI Details Patient Name: Date of Service: [name], Max 01/31/2019 9:15 AM Medical Record [id number] Patient Account Number: [id number] Date of Birth/Sex: Treating RN: [birth year] (33 y.o. 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 He comes in for review of a wound over the plantar fifth metatarsal head extending into the lateral part of the foot. He was followed for this previously by his podiatrist Dr. Cornelius Moras. As the patient tells his story he went to see podiatry first for a swelling he developed on the lateral part of his fifth metatarsal head in May. He states this was "open" by podiatry and the area closed. He was followed up in June and it was again opened callus removed and it closed promptly. There were plans being made for surgery on the fifth metatarsal head in June however his blood sugar was apparently too high for anesthesia. Apparently the area was debrided and opened again in June and it is never closed since. Looking over the records from podiatry I am really not able to follow this. It was clear when he was first seen it was before 5/14 at that point he already had a wound. By 5/17 the ulcer was resolved. I do not see anything about a procedure. On 5/28 noted to have pre-ulcerative moderate keratosis. X-ray noted 1/5 contracted toe and tailor's bunion and metatarsal deformity. On a visit date on 09/28/2018 the dorsal part of the left foot it healed and resolved. There was concern about swelling in his lower extremity he was sent to the ER.. As far as I can tell he was seen in the ER on 7/12 with an ulcer on his left foot. A DVT rule  out of the left leg was negative. I do not think I have complete records from podiatry but I am not able to verify the procedures this patient states he had. He states after the last procedure the wound has never closed although I am not able to follow this in the records I have from podiatry. He has not had a recent x-ray The patient has been using Neosporin on the wound. He is wearing a Darco shoe. He is still very active up on his foot working and exercising. Past medical history; type 2 diabetes ketosis-prone, leg swelling with a negative DVT study in July. Non-smoker ABI in our clinic was 0.85 on the left 10/16; substantial wound on the plantar left fifth met head extending laterally almost to the dorsal fifth MTP. We have been using silver alginate we gave him a Darco forefoot off loader. An x-ray did not show evidence of osteomyelitis did note soft tissue emphysema which I think was due to gas tracking through an open wound. There is no doubt in my mind he requires an MRI 10/23; MRI not booked until 3 November at the earliest this is largely due to his glucose sensor in the right arm. We have been using silver alginate. There has been an improvement 10/29; I am still not exactly sure when his MRI is booked for. He says it is the third but it is the 10th in epic. This definitely needs to be done. He is running a low-grade fever today but no other symptoms. No real improvement in the 1  Electronic Signature(s) Signed: 02/04/2019 8:24:05 AM By: [name] MD Entered By: [name] on 01/31/2019 10:43:56 -------------------------------------------------------------------------------- Physical Exam Details Patient Name: Date of Service: [name], Max 01/31/2019 9:15 AM Medical Record [id number] Patient Account Number: [id number] Date of Birth/Sex: Treating RN: 03/18/1986 (33 y.o. [name] Primary Care Provider: [name] Other Clinician: Referring  Provider: Treating Provider/Extender:Robson, [name], [name]: 2 Constitutional Patient is hypertensive.. Pulse regular and within target range for patient.Marland Kitchen Respirations regular, non-labored and within target range.. Patient is febrile today. Mild fever. Appears in no distress. Integumentary (Hair, Skin) No erythema around the wound. Notes Wound exam; left fifth metatarsal head plantar aspect extending slightly to the lateral part of this wound. The tissue here looks healthy however no better. There is very little of it over the bone itself. I do not see overt evidence of infection in this area. Electronic Signature(s) Signed: 02/04/2019 8:24:05 AM By: [name] MD Entered By: [name] on 01/31/2019 10:45:10 -------------------------------------------------------------------------------- Physician Orders Details Patient Name: Date of Service: [name], Max 01/31/2019 9:15 AM Medical Record [id number] Patient Account Number: [id number] Date of Birth/Sex: Treating RN: Sep 24, 1985 (33 y.o. 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 Dressing Change Frequency Wound #1 Left Metatarsal head fifth Change dressing every day. Skin Barriers/Peri-Wound Care Moisturizing lotion - to both legs daily. Wound Cleansing Wound #1 Left Metatarsal head fifth May shower and wash wound with soap and water. Primary Wound Dressing Wound #1 Left Metatarsal head fifth Calcium Alginate with  Silver Secondary Dressing Wound #1 Left Metatarsal head fifth Kerlix/Rolled Gauze Dry Gauze Other: - felt callous pad Off-Loading Wedge shoe to: - front offloading darco to left foot Additional Orders / Instructions Other: - please remove primary dressing calicum alginate Ag before MRI. cover with gauze and kerlix before having MRI. Electronic Signature(s) Signed: 01/31/2019 3:04:52 PM By: Deon Pilling Signed: 02/04/2019 8:24:05 AM By: [name] MD Entered By: Deon Pilling on 01/31/2019 10:40:54 -------------------------------------------------------------------------------- Problem List Details Patient Name: Date of Service: [name], Max 01/31/2019 9:15 AM Medical Record [id number] Patient Account Number: [id number] Date of Birth/Sex: Treating RN: 26-Feb-1986 (33 y.o. [name] Primary Care Provider: [name] Other Clinician: Referring Provider: Treating Provider/Extender:Robson, [name], [name]: 2 Active Problems ICD-10 Evaluated Encounter Code Description Active Date Today Diagnosis E11.621 Type 2 diabetes mellitus with foot ulcer 01/11/2019 No Yes L97.521 Non-pressure chronic ulcer of other part of left foot 01/11/2019 No Yes limited to breakdown of skin E11.42 Type 2 diabetes mellitus with diabetic polyneuropathy 01/11/2019 No Yes Inactive Problems Resolved Problems Electronic Signature(s) Signed: 02/04/2019 8:24:05 AM By: [name] MD Entered By: [name] on 01/31/2019 10:43:03 -------------------------------------------------------------------------------- Progress Note Details Patient Name: Date of Service: [name], Max 01/31/2019 9:15 AM Medical Record [id number] Patient Account Number: [id number] Date of Birth/Sex: Treating RN: 1985/04/21 (33 y.o. 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 He comes in for review of a wound over the plantar fifth metatarsal head extending into the lateral part of the foot. He was followed for this previously by his podiatrist Dr. Cornelius Moras. As the patient tells his story he went to see podiatry first for a swelling he developed on the lateral part of his fifth metatarsal head in May. He states this was "open" by podiatry and the area closed. He was followed up in June and it was again opened callus removed and it closed promptly. There were plans being made for surgery on the fifth metatarsal head in June however his blood sugar was apparently too high for anesthesia. Apparently the area was debrided and opened again in June and it is never closed since. Looking over the records from podiatry I am really not able to follow this. It was clear when he was first seen it was before 5/14 at that point he already had a wound. By 5/17 the ulcer was resolved. I do not see anything about a procedure. On 5/28 noted to have pre-ulcerative moderate keratosis. X-ray noted 1/5 contracted toe and tailor's bunion and metatarsal deformity. On a visit date on 09/28/2018 the dorsal part of the left foot it healed and resolved. There was concern about swelling in his lower extremity he was sent to the ER.. As far as I can tell he was seen in the ER on 7/12 with an ulcer on his left foot. A DVT rule out of the left leg was negative. I do not think I have complete records from podiatry but I am not able to verify the procedures this patient states he had. He states after the last procedure the wound has never closed although I am not able to follow this in the records I have from podiatry. He has not had a recent x-ray The patient has been using Neosporin on the wound. He is  wearing a Darco shoe. He is still very active up on his foot working and exercising. Past medical history; type 2 diabetes ketosis-prone, leg swelling with a negative DVT study in July. Non-smoker ABI in our clinic was 0.85 on the left 10/16; substantial wound on the plantar left fifth met head extending laterally almost to the dorsal fifth MTP. We have been using silver alginate we gave him a Darco forefoot off loader. An x-ray did not show evidence of osteomyelitis did note soft tissue emphysema which I think was due to gas tracking through an open wound. There is no doubt in my mind he requires an MRI 10/23; MRI not booked until 3 November at the earliest this is largely due to his glucose sensor in the right arm. We have been using silver alginate. There has been an improvement 10/29; I am still not exactly sure when his MRI is booked for. He says it is the third but it is the 10th in epic. This definitely needs to be done. He is running a low-grade fever today but no other symptoms. No real improvement in the 1 Objective Constitutional Patient is hypertensive.. Pulse regular and within target range for patient.Marland Kitchen Respirations regular, non-labored and within target range.. Patient is febrile today. Mild fever. Appears in no distress. Vitals Time Taken: 9:30 AM, Height: 77 in, Weight: 260 lbs, BMI: 30.8, Temperature: 99.6 F, Pulse: 101 bpm, Respiratory Rate: 18 breaths/min, Blood Pressure: 158/81 mmHg. General Notes: patient stated he is unsure what blood sugar is, has not checked it in almost a week. 190 was his  CBG on 01/25/2019 per patient General Notes: Wound exam; left fifth metatarsal head plantar aspect extending slightly to the lateral part of this wound. The tissue here looks healthy however no better. There is very little of it over the bone itself. I do not see overt evidence of infection in this area. Integumentary (Hair, Skin) No erythema around the wound. Wound #1 status  is Open. Original cause of wound was Trauma. The wound is located on the Left Metatarsal head fifth. The wound measures 2.1cm length x 2.3cm width x 0.8cm depth; 3.793cm^2 area and 3.035cm^3 volume. There is Fat Layer (Subcutaneous Tissue) Exposed exposed. There is no tunneling noted, however, there is undermining starting at 4:00 and ending at 9:00 with a maximum distance of 1.2cm. There is a medium amount of serosanguineous drainage noted. The wound margin is well defined and not attached to the wound base. There is large (67-100%) red, pink granulation within the wound bed. There is a small (1-33%) amount of necrotic tissue within the wound bed including Adherent Slough. 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 Dressing Change Frequency: Wound #1 Left Metatarsal head fifth: Change dressing every day. Skin Barriers/Peri-Wound Care: Moisturizing lotion - to both legs daily. Wound Cleansing: Wound #1 Left Metatarsal head fifth: May shower and wash wound with soap and water. Primary Wound Dressing: Wound #1 Left Metatarsal head fifth: Calcium Alginate with Silver Secondary Dressing: Wound #1 Left Metatarsal head fifth: Kerlix/Rolled Gauze Dry Gauze Other: - felt callous pad Off-Loading: Wedge shoe to: - front offloading darco to left foot Additional Orders / Instructions: Other: - please remove primary dressing calicum alginate Ag before MRI. cover with gauze and kerlix before having MRI. 1. Silver alginate to continue 2. I have asked him to get this MRI done as soon as we can. I am concerned about osteomyelitis septic arthritis etc. Electronic Signature(s) Signed: 02/04/2019 8:24:05 AM By: [name] MD Entered By: [name] on 01/31/2019  10:45:41 -------------------------------------------------------------------------------- SuperBill Details Patient Name: Date of Service: [name], Max 01/31/2019 Medical Record [id number] Patient Account Number: [id number] Date of Birth/Sex: Treating RN: Apr 10, 1985 (33 y.o. 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

## 2019-02-05 ENCOUNTER — Inpatient Hospital Stay (HOSPITAL_COMMUNITY): Payer: BC Managed Care – PPO

## 2019-02-05 DIAGNOSIS — E139 Other specified diabetes mellitus without complications: Secondary | ICD-10-CM

## 2019-02-05 DIAGNOSIS — E11621 Type 2 diabetes mellitus with foot ulcer: Secondary | ICD-10-CM

## 2019-02-05 LAB — URINE CULTURE: Culture: NO GROWTH

## 2019-02-05 LAB — CBC WITH DIFFERENTIAL/PLATELET
Abs Immature Granulocytes: 0.01 10*3/uL (ref 0.00–0.07)
Basophils Absolute: 0 10*3/uL (ref 0.0–0.1)
Basophils Relative: 0 %
Eosinophils Absolute: 0 10*3/uL (ref 0.0–0.5)
Eosinophils Relative: 0 %
HCT: 39.7 % (ref 39.0–52.0)
Hemoglobin: 12.8 g/dL — ABNORMAL LOW (ref 13.0–17.0)
Immature Granulocytes: 0 %
Lymphocytes Relative: 19 %
Lymphs Abs: 0.5 10*3/uL — ABNORMAL LOW (ref 0.7–4.0)
MCH: 27.4 pg (ref 26.0–34.0)
MCHC: 32.2 g/dL (ref 30.0–36.0)
MCV: 85 fL (ref 80.0–100.0)
Monocytes Absolute: 0.2 10*3/uL (ref 0.1–1.0)
Monocytes Relative: 8 %
Neutro Abs: 2 10*3/uL (ref 1.7–7.7)
Neutrophils Relative %: 73 %
Platelets: 134 10*3/uL — ABNORMAL LOW (ref 150–400)
RBC: 4.67 MIL/uL (ref 4.22–5.81)
RDW: 12.1 % (ref 11.5–15.5)
WBC: 2.8 10*3/uL — ABNORMAL LOW (ref 4.0–10.5)
nRBC: 0 % (ref 0.0–0.2)

## 2019-02-05 LAB — COMPREHENSIVE METABOLIC PANEL
ALT: 26 U/L (ref 0–44)
AST: 25 U/L (ref 15–41)
Albumin: 2.8 g/dL — ABNORMAL LOW (ref 3.5–5.0)
Alkaline Phosphatase: 59 U/L (ref 38–126)
Anion gap: 10 (ref 5–15)
BUN: 10 mg/dL (ref 6–20)
CO2: 24 mmol/L (ref 22–32)
Calcium: 8.3 mg/dL — ABNORMAL LOW (ref 8.9–10.3)
Chloride: 95 mmol/L — ABNORMAL LOW (ref 98–111)
Creatinine, Ser: 1.08 mg/dL (ref 0.61–1.24)
GFR calc Af Amer: >60 mL/min (ref >60)
GFR calc non Af Amer: >60 mL/min (ref >60)
Glucose, Bld: 228 mg/dL — ABNORMAL HIGH (ref 70–99)
Potassium: 4.3 mmol/L (ref 3.5–5.1)
Sodium: 129 mmol/L — ABNORMAL LOW (ref 135–145)
Total Bilirubin: 0.7 mg/dL (ref 0.3–1.2)
Total Protein: 6.2 g/dL — ABNORMAL LOW (ref 6.5–8.1)

## 2019-02-05 LAB — C-REACTIVE PROTEIN: CRP: 4.4 mg/dL — ABNORMAL HIGH (ref <1.0)

## 2019-02-05 LAB — GLUCOSE, CAPILLARY
Glucose-Capillary: 218 mg/dL — ABNORMAL HIGH (ref 70–99)
Glucose-Capillary: 189 mg/dL — ABNORMAL HIGH (ref 70–99)
Glucose-Capillary: 104 mg/dL — ABNORMAL HIGH (ref 70–99)
Glucose-Capillary: 133 mg/dL — ABNORMAL HIGH (ref 70–99)

## 2019-02-05 LAB — D-DIMER, QUANTITATIVE: D-Dimer, Quant: 0.53 ug/mL-FEU — ABNORMAL HIGH (ref 0.00–0.50)

## 2019-02-05 LAB — FERRITIN: Ferritin: 305 ng/mL (ref 24–336)

## 2019-02-05 LAB — MAGNESIUM: Magnesium: 1.9 mg/dL (ref 1.7–2.4)

## 2019-02-05 LAB — PHOSPHORUS: Phosphorus: 2.6 mg/dL (ref 2.5–4.6)

## 2019-02-05 MED ORDER — ENOXAPARIN SODIUM 40 MG/0.4ML ~~LOC~~ SOLN: 40.0000 mg | SUBCUTANEOUS | Status: DC

## 2019-02-05 MED ORDER — SODIUM CHLORIDE 0.9 % IV SOLN
100.0000 mg | INTRAVENOUS | Status: DC
  Filled 2019-02-05: qty 20

## 2019-02-05 MED ORDER — ACETAMINOPHEN 325 MG PO TABS
650.0000 mg | ORAL_TABLET | Freq: Four times a day (QID) | ORAL | Status: DC | PRN
  Administered 2019-02-05: 650 mg via ORAL

## 2019-02-05 MED ORDER — SODIUM CHLORIDE 0.9 % IV SOLN
200.0000 mg | Freq: Once | INTRAVENOUS | Status: DC
  Filled 2019-02-05: qty 40

## 2019-02-05 MED ORDER — ENOXAPARIN SODIUM 60 MG/0.6ML ~~LOC~~ SOLN
60.0000 mg | SUBCUTANEOUS | Status: DC
  Administered 2019-02-05: 60 mg via SUBCUTANEOUS
  Filled 2019-02-05: qty 0.6

## 2019-02-05 NOTE — Progress Notes (Signed)
Pt refused Remdesivir. It was explained to patient that its used for treating covid infection.  An information sheet from the pharmacy was provided for him to read. I asked if he had questions about the medication and he said he did not want to take the medication. MD notified. 

## 2019-02-05 NOTE — Discharge Instructions (Signed)
COVID-19 Frequently Asked Questions °COVID-19 (coronavirus disease) is an infection that is caused by a large family of viruses. Some viruses cause illness in people and others cause illness in animals like camels, cats, and bats. In some cases, the viruses that cause illness in animals can spread to humans. °Where did the coronavirus come from? °In December 2019, [place] told the World Health Organization (WHO) of several cases of lung disease (human respiratory illness). These cases were linked to an open seafood and livestock market in the city of [place]. The link to the seafood and livestock market suggests that the virus may have spread from animals to humans. However, since that first outbreak in December, the virus has also been shown to spread from person to person. °What is the name of the disease and the virus? °Disease name °Early on, this disease was called novel coronavirus. This is because scientists determined that the disease was caused by a new (novel) respiratory virus. The World Health Organization (WHO) has now named the disease COVID-19, or coronavirus disease. °Virus name °The virus that causes the disease is called severe acute respiratory syndrome coronavirus 2 (SARS-CoV-2). °More information on disease and virus naming °World Health Organization (WHO): www.who.int/emergencies/diseases/novel-coronavirus-2019/technical-guidance/naming-the-coronavirus-disease-(covid-2019)-and-the-virus-that-causes-it °Who is at risk for complications from coronavirus disease? °Some people may be at higher risk for complications from coronavirus disease. This includes older adults and people who have chronic diseases, such as heart disease, diabetes, and lung disease. °If you are at higher risk for complications, take these extra precautions: °· Avoid close contact with people who are sick or have a fever or cough. Stay at least 3-6 ft (1-2 m) away from them, if possible. °· Wash your hands often with soap and  water for at least 20 seconds. °· Avoid touching your face, mouth, nose, or eyes. °· Keep supplies on hand at home, such as food, medicine, and cleaning supplies. °· Stay home as much as possible. °· Avoid social gatherings and travel. °How does coronavirus disease spread? °The virus that causes coronavirus disease spreads easily from person to person (is contagious). There are also cases of community-spread disease. This means the disease has spread to: °· People who have no known contact with other infected people. °· People who have not traveled to areas where there are known cases. °It appears to spread from one person to another through droplets from coughing or sneezing. °Can I get the virus from touching surfaces or objects? °There is still a lot that we do not know about the virus that causes coronavirus disease. Scientists are basing a lot of information on what they know about similar viruses, such as: °· Viruses cannot generally survive on surfaces for long. They need a human body (host) to survive. °· It is more likely that the virus is spread by close contact with people who are sick (direct contact), such as through: °? Shaking hands or hugging. °? Breathing in respiratory droplets that travel through the air. This can happen when an infected person coughs or sneezes on or near other people. °· It is less likely that the virus is spread when a person touches a surface or object that has the virus on it (indirect contact). The virus may be able to enter the body if the person touches a surface or object and then touches his or her face, eyes, nose, or mouth. °Can a person spread the virus without having symptoms of the disease? °It may be possible for the virus to spread before a person   has symptoms of the disease, but this is most likely not the main way the virus is spreading. It is more likely for the virus to spread by being in close contact with people who are sick and breathing in the respiratory  droplets of a sick person's cough or sneeze. °What are the symptoms of coronavirus disease? °Symptoms vary from person to person and can range from mild to severe. Symptoms may include: °· Fever. °· Cough. °· Tiredness, weakness, or fatigue. °· Fast breathing or feeling short of breath. °These symptoms can appear anywhere from 2 to 14 days after you have been exposed to the virus. If you develop symptoms, call your health care provider. People with severe symptoms may need hospital care. °If I am exposed to the virus, how long does it take before symptoms start? °Symptoms of coronavirus disease may appear anywhere from 2 to 14 days after a person has been exposed to the virus. If you develop symptoms, call your health care provider. °Should I be tested for this virus? °Your health care provider will decide whether to test you based on your symptoms, history of exposure, and your risk factors. °How does a health care provider test for this virus? °Health care providers will collect samples to send for testing. Samples may include: °· Taking a swab of fluid from the nose. °· Taking fluid from the lungs by having you cough up mucus (sputum) into a sterile cup. °· Taking a blood sample. °· Taking a stool or urine sample. °Is there a treatment or vaccine for this virus? °Currently, there is no vaccine to prevent coronavirus disease. Also, there are no medicines like antibiotics or antivirals to treat the virus. A person who becomes sick is given supportive care, which means rest and fluids. A person may also relieve his or her symptoms by using over-the-counter medicines that treat sneezing, coughing, and runny nose. These are the same medicines that a person takes for the common cold. °If you develop symptoms, call your health care provider. People with severe symptoms may need hospital care. °What can I do to protect myself and my family from this virus? ° °  ° °You can protect yourself and your family by taking the  same actions that you would take to prevent the spread of other viruses. Take the following actions: °· Wash your hands often with soap and water for at least 20 seconds. If soap and water are not available, use alcohol-based hand sanitizer. °· Avoid touching your face, mouth, nose, or eyes. °· Cough or sneeze into a tissue, sleeve, or elbow. Do not cough or sneeze into your hand or the air. °? If you cough or sneeze into a tissue, throw it away immediately and wash your hands. °· Disinfect objects and surfaces that you frequently touch every day. °· Avoid close contact with people who are sick or have a fever or cough. Stay at least 3-6 ft (1-2 m) away from them, if possible. °· Stay home if you are sick, except to get medical care. Call your health care provider before you get medical care. °· Make sure your vaccines are up to date. Ask your health care provider what vaccines you need. °What should I do if I need to travel? °Follow travel recommendations from your local health authority, the CDC, and WHO. °Travel information and advice °· Centers for Disease Control and Prevention (CDC): www.cdc.gov/coronavirus/2019-ncov/travelers/index.html °· World Health Organization (WHO): www.who.int/emergencies/diseases/novel-coronavirus-2019/travel-advice °Know the risks and take action to protect your health °·   You are at higher risk of getting coronavirus disease if you are traveling to areas with an outbreak or if you are exposed to travelers from areas with an outbreak. °· Wash your hands often and practice good hygiene to lower the risk of catching or spreading the virus. °What should I do if I am sick? °General instructions to stop the spread of infection °· Wash your hands often with soap and water for at least 20 seconds. If soap and water are not available, use alcohol-based hand sanitizer. °· Cough or sneeze into a tissue, sleeve, or elbow. Do not cough or sneeze into your hand or the air. °· If you cough or  sneeze into a tissue, throw it away immediately and wash your hands. °· Stay home unless you must get medical care. Call your health care provider or local health authority before you get medical care. °· Avoid public areas. Do not take public transportation, if possible. °· If you can, wear a mask if you must go out of the house or if you are in close contact with someone who is not sick. °Keep your home clean °· Disinfect objects and surfaces that are frequently touched every day. This may include: °? Counters and tables. °? Doorknobs and light switches. °? Sinks and faucets. °? Electronics such as phones, remote controls, keyboards, computers, and tablets. °· Wash dishes in hot, soapy water or use a dishwasher. Air-dry your dishes. °· Wash laundry in hot water. °Prevent infecting other household members °· Let healthy household members care for children and pets, if possible. If you have to care for children or pets, wash your hands often and wear a mask. °· Sleep in a different bedroom or bed, if possible. °· Do not share personal items, such as razors, toothbrushes, deodorant, combs, brushes, towels, and washcloths. °Where to find more information °Centers for Disease Control and Prevention (CDC) °· Information and news updates: www.cdc.gov/coronavirus/2019-ncov °World Health Organization (WHO) °· Information and news updates: www.who.int/emergencies/diseases/novel-coronavirus-2019 °· Coronavirus health topic: www.who.int/health-topics/coronavirus °· Questions and answers on COVID-19: www.who.int/news-room/q-a-detail/q-a-coronaviruses °· Global tracker: who.sprinklr.com °American Academy of Pediatrics (AAP) °· Information for families: www.healthychildren.org/English/health-issues/conditions/chest-lungs/Pages/2019-Novel-Coronavirus.aspx °The coronavirus situation is changing rapidly. Check your local health authority website or the CDC and WHO websites for updates and news. °When should I contact a health care  provider? °· Contact your health care provider if you have symptoms of an infection, such as fever or cough, and you: °? Have been near anyone who is known to have coronavirus disease. °? Have come into contact with a person who is suspected to have coronavirus disease. °? Have traveled outside of the country. °When should I get emergency medical care? °· Get help right away by calling your local emergency services (911 in the [place] if you have: °? Trouble breathing. °? Pain or pressure in your chest. °? Confusion. °? Blue-tinged lips and fingernails. °? Difficulty waking from sleep. °? Symptoms that get worse. °Let the emergency medical personnel know if you think you have coronavirus disease. °Summary °· A new respiratory virus is spreading from person to person and causing COVID-19 (coronavirus disease). °· The virus that causes COVID-19 appears to spread easily. It spreads from one person to another through droplets from coughing or sneezing. °· Older adults and those with chronic diseases are at higher risk of disease. If you are at higher risk for complications, take extra precautions. °· There is currently no vaccine to prevent coronavirus disease. There are no medicines, such as antibiotics or   antivirals, to treat the virus. °· You can protect yourself and your family by washing your hands often, avoiding touching your face, and covering your coughs and sneezes. °This information is not intended to replace advice given to you by your health care provider. Make sure you discuss any questions you have with your health care provider. °Document Released: 07/17/2018 Document Revised: 07/17/2018 Document Reviewed: 07/17/2018 °Elsevier Patient Education © 2020 [employer] ° °

## 2019-02-05 NOTE — Consult Note (Signed)
   [name] Heitz MRN: [id number] DOB: 12-17-1985 DOA: 02/03/2019  PODIATRY CONSULT NOTE  HPI: 33 y.o. male h/o uncontrolled DM, insulin-dependent, and foot ulcer positive for COVID-19 originally presented to the urgent care center with fever, dry cough, generalized fatigue and body aches.  Patient was being treated at the wound care clinic and scheduled for MRI regarding an ulcer to the left foot that was chronic.  MRI performed while inpatient yesterday, 02/04/2019, positive for osteomyelitis of the fifth ray left foot.  Podiatry consulted.  Past Medical History:  Diagnosis Date  . Diabetes mellitus      Physical Exam: General: The patient is alert and oriented x3 in no acute distress.  Dermatology: Ulcer noted subfifth MTP joint left foot measuring approximately 2.5 x 2.5 x 0.5 cm.  Wound base appears to be mostly granular with intermittent fibrotic tissue.  No significant malodor.  Moderate serosanguineous drainage noted.  Periwound integrity appears to be intact.  Vascular: Palpable pedal pulses bilaterally.  Minimal edema and erythema noted.   Neurological: Epicritic and protective threshold diminished bilaterally.   Musculoskeletal Exam: Ulcer subfifth MTP joint left foot with underlying osteomyelitis fifth metatarsal  MRI impression 02/04/2019:  1. Septic arthritis at the fifth MTP joint and osteomyelitis involving the fifth metatarsal head and proximal phalanx. There is an overlying plantar open wound and abscess tracking back along the lateral aspect of the fifth metatarsal shaft. 2. Diffuse cellulitis and myofasciitis without findings for pyomyositis.  Assessment: 1.  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  Ulcer left foot secondary to diabetes mellitus 3.  Insulin-dependent DM, uncontrolled 4.  Positive COVID-19   Plan of Care:  1. Patient evaluated.  MRI reviewed.  2.  Evaluation of the ulcer indicates that is very stable and  chronic.  There is not appear to be any acute changes.  I believe the ulcer and underlying osteomyelitis is chronic and stable enough to manage outpatient once the patient has finished quarantining. Pt refused Remdesivir, and according to the night nurse will likely be discharged within the next day or 2 to finish quarantining at home 3.  Follow-up in office to discuss surgery, partial foot amputation, post discharge and after quarantine and self-isolation.  I did explain to the patient that he will require partial foot amputation. 4.  Orders for dressing changes daily.  Paint wound with Betadine and dressed with dry sterile dressing  -Podiatry to sign off. Thank you for the consult.       Edrick Kins, DPM [facility]  Dr. Edrick Kins, DPM    [address],  [address]                Office [phone]  Fax [phone]

## 2019-02-05 NOTE — Progress Notes (Signed)
Pharmacy:  [facility] on 11/1 with COVID PNA confirmed on imaging. LFTs are wnl. Pharmacy consulted to start Remdesivir.   Plan - Start Remdesivir 200 mg IV x 1 followed by 100 mg IV every 24 hours x 4 doses - Pharmacy will sign off and monitor peripherally  Thank you for allowing pharmacy to be a part of this patient's care.  Alycia Rossetti, PharmD, BCPS Clinical Pharmacist Clinical phone for 02/05/2019: [phone] 02/05/2019 3:18 PM   **Pharmacist phone directory can now be found on [email or web address] ([email or web address]).  Listed under [facility].

## 2019-02-05 NOTE — Progress Notes (Signed)
PROGRESS NOTE    [name]  ZOX:[id number] DOB: 06-09-1985 DOA: 02/03/2019 PCP: Renford DillsPolite, Ronald, MD    Brief Narrative:  [name] is a 33 y.o. male with medical history significant of insulin-dependent type 2 diabetes, chronic left diabetic foot ulcer presented to urgent care center with temperature of 100.7, dry cough, generalized fatigue and body ache for at least last 3 days PTA. No sick contacts.  Denies any COVID-19 exposure at home or at work. Patient does have a diabetic foot ulcer on his left fifth metatarsal head, has seen different providers, recently seen at wound care clinic and planned for an MRI. In the urgent care , he was found with temperature 102.5, blood pressures 92/50. Blood pressure responded to IV fluids.  X-ray showed soft tissue swelling no obvious bony involvement. Patient was started on broad-spectrum antibiotics.  Patient's Covid test was positive.  Pt admitted for further management.    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 Ethelene HalEvens, will see patient in consultation and decide if debridement will be needed during this admission or DC home with antibiotics and follow-up debridement as an outpatient.  Still awaiting his consultation Continue vancomycin and cefepime  Pneumonia 2/2 COVID-19 virus Currently afebrile, with leukopenia Currently maintaining sats around 90% on room air CRP elevated (although patient has osteomyelitis) Procalcitonin negative D-dimer 0.53 Chest x-ray showed basilar airspace  opacities consistent with Covid pneumonia Start remdesivir, will hold off on steroids as patient is currently not hypoxic Trend inflammatory markers Monitor closely  Hyponatremia Chronic, unclear etiology Daily CMP  Insulin-dependent type 2 diabetes with hyperglycemia A1c 9 Continue SSI, NovoLog 3 times daily, Levemir twice daily Hypoglycemic protocol, Accu-Cheks   DVT prophylaxis: Lovenox subcu Code Status: Full code Family Communication: None Disposition Plan: To be determined   Consultants:   Podiatry  Procedures:   None  Antimicrobials:   Vancomycin, cefepime 02/03/2019-  Flagyl, 02/03/2019-02/04/2019   Subjective: Patient seen and examined at bedside.  Continues to complain of cough, denies any worsening shortness of breath, fever/chills, denies any abdominal pain, nausea/vomiting/diarrhea.  Objective: Vitals:   02/04/19 1648 02/04/19 2257 02/05/19 0442 02/05/19 0737  BP: (!) 145/87 113/71  136/81  Pulse: 95 93  89  Resp: 18 20  18   Temp: 99.5 F (37.5 C) (!) 103.1 F (39.5 C) 99.3 F (37.4 C) 99.6 F (37.6 C)  TempSrc: Oral Oral Oral Oral  SpO2: 96% 96%  90%  Weight: 122.3 kg     Height:        Intake/Output Summary (Last 24 hours) at 02/05/2019 1249 Last data filed at 02/05/2019 [id number] Gross per 24 hour  Intake 2049.11 ml  Output --  Net 2049.11 ml   Filed Weights   02/03/19 1400 02/03/19 1525 02/04/19 1648  Weight: 117.9 kg 117.9 kg 122.3 kg    Examination:  General: NAD   Cardiovascular: S1, S2 present  Respiratory:  Diminished breath sounds bilaterally at the bases  Abdomen: Soft, nontender, nondistended, bowel sounds present  Musculoskeletal: No bilateral pedal edema noted  Skin: Normal  Psychiatry: Normal mood  Data Reviewed: I have personally reviewed following labs and imaging studies  CBC: Recent Labs  Lab 02/03/19 1258 02/03/19 1412 02/03/19 1618 02/04/19 0220 02/05/19 0340  WBC 4.2 3.5*  --  2.3*  2.8*  NEUTROABS  --  2.5  --   --  2.0  HGB 13.5 13.4 16.0 11.8* 12.8*  HCT 42.3 41.4 47.0 37.0* 39.7  MCV 84.9 84.5  --  85.1 85.0  PLT 157 168  --  130* 134*   Basic Metabolic Panel: Recent Labs  Lab 02/03/19 1258 02/03/19 1412 02/03/19 1618 02/04/19 0220 02/05/19 0340  NA 129* 128* 131* 132* 129*  K 5.1 5.9* 4.5 4.3 4.3  CL 92* 93* 91* 99 95*  CO2 27 25  --  25 24  GLUCOSE 411* 401* 403* 312* 228*  BUN 15 15 19 15 10   CREATININE 1.39* 1.50* 1.30* 1.02 1.08  CALCIUM 9.0 8.9  --  8.2* 8.3*  MG  --   --   --  1.9 1.9  PHOS  --   --   --  3.1 2.6   GFR: Estimated Creatinine Clearance: 142.2 mL/min (by C-G formula based on SCr of 1.08 mg/dL). Liver Function Tests: Recent Labs  Lab 02/03/19 1412 02/04/19 1630 02/05/19 0340  AST 25 26 25   ALT 39 28 26  ALKPHOS 82 65 59  BILITOT 0.5 0.8 0.7  PROT 7.3 6.4* 6.2*  ALBUMIN 3.4* 2.9* 2.8*   No results for input(s): LIPASE, AMYLASE in the last 168 hours. No results for input(s): AMMONIA in the last 168 hours. Coagulation Profile: Recent Labs  Lab 02/03/19 1523  INR 1.1   Cardiac Enzymes: No results for input(s): CKTOTAL, CKMB, CKMBINDEX, TROPONINI in the last 168 hours. BNP (last 3 results) No results for input(s): PROBNP in the last 8760 hours. HbA1C: Recent Labs    02/03/19 1412  HGBA1C 9.9*   CBG: Recent Labs  Lab 02/04/19 0833 02/04/19 1231 02/04/19 1643 02/04/19 2102 02/05/19 0734  GLUCAP 312* 286* 284* 209* 218*   Lipid Profile: No results for input(s): CHOL, HDL, LDLCALC, TRIG, CHOLHDL, LDLDIRECT in the last 72 hours. Thyroid Function Tests: No results for input(s): TSH, T4TOTAL, FREET4, T3FREE, THYROIDAB in the last 72 hours. Anemia Panel: Recent Labs    02/04/19 1630 02/05/19 0340  FERRITIN 278 305   Sepsis Labs: Recent Labs  Lab 02/03/19 1412 02/03/19 1604 02/03/19 2034 02/04/19 1630  PROCALCITON  --   --   --  <0.10  LATICACIDVEN 1.5 2.0* 1.8  --     Recent Results (from the  past 240 hour(s))  Novel Coronavirus, NAA (Hosp order, Send-out to Ref Lab; TAT 18-24 hrs     Status: Abnormal   Collection Time: 02/03/19  1:01 PM   Specimen: Nasopharyngeal Swab; Respiratory  Result Value Ref Range Status   SARS-CoV-2, NAA DETECTED (A) NOT DETECTED Final    Comment: RESULT CALLED TO, READ BACK BY AND VERIFIED WITH: J. [name], RN AT 1220 ON 02/04/19 BY C. JESSUP, MT. (NOTE)                  Client Requested Flag This nucleic acid amplification test was developed and its performance characteristics determined by 13/01/20. Nucleic acid amplification tests include PCR and TMA. This test has not been FDA cleared or approved. This test has been authorized by FDA under an Emergency Use Authorization (EUA). This test is only authorized for the duration of time the declaration that circumstances exist justifying the  authorization of the emergency use of in vitro diagnostic tests for detection of SARS-CoV-2 virus and/or diagnosis of COVID-19 infection under section 564(b)(1) of the Act, 21 U.S.C. 161WRU-0(A) (1), unless the authorization is terminated or revoked sooner. When diagnostic testing is negative, the possibility of a false negative result should be considered in the context of a patient's recent exposures and the presenc e of clinical signs and symptoms consistent with COVID-19. An individual without symptoms of COVID- 19 and who is not shedding SARS-CoV-2 virus would expect to have a negative (not detected) result in this assay. Performed At: [facility] [address] [place], [place] [id number] Jolene Schimke MD YN:[id number]    Coronavirus Source NASOPHARYNGEAL  Final    Comment: Performed at [facility], [address] [place], [place] [address]  SARS CORONAVIRUS 2 (TAT 6-24 HRS) Nasopharyngeal Nasopharyngeal Swab     Status: Abnormal   Collection Time: 02/03/19  3:22 PM   Specimen: Nasopharyngeal Swab  Result Value Ref Range  Status   SARS Coronavirus 2 POSITIVE (A) NEGATIVE Final    Comment: RESULT CALLED TO, READ BACK BY AND VERIFIED WITH: B.LITTLE RN 1959 02/04/2019 MCCORMICK K (NOTE) SARS-CoV-2 target nucleic acids are DETECTED. The SARS-CoV-2 RNA is generally detectable in upper and lower respiratory specimens during the acute phase of infection. Positive results are indicative of active infection with SARS-CoV-2. Clinical  correlation with patient history and other diagnostic information is necessary to determine patient infection status. Positive results do  not rule out bacterial infection or co-infection with other viruses. The expected result is Negative. Fact Sheet for Patients: HairSlick.no Fact Sheet for Healthcare Providers: quierodirigir.com This test is not yet approved or cleared by the [place] FDA and  has been authorized for detection and/or diagnosis of SARS-CoV-2 by FDA under an Emergency Use Authorization (EUA). This EUA will remain  in effect (meaning this test can be used) f or the duration of the COVID-19 declaration under Section 564(b)(1) of the Act, 21 U.S.C. section 360bbb-3(b)(1), unless the authorization is terminated or revoked sooner. Performed at [facility], [address], [place], [place] [address]   Blood Culture (routine x 2)     Status: None (Preliminary result)   Collection Time: 02/03/19  3:53 PM   Specimen: BLOOD  Result Value Ref Range Status   Specimen Description BLOOD LEFT ANTECUBITAL  Final   Special Requests   Final    BOTTLES DRAWN AEROBIC AND ANAEROBIC Blood Culture adequate volume   Culture   Final    NO GROWTH 2 DAYS Performed at [facility], [address], [place], [place] [address]    Report Status PENDING  Incomplete  Blood Culture (routine x 2)     Status: None (Preliminary result)   Collection Time: 02/03/19  4:11 PM   Specimen: BLOOD  Result Value Ref Range Status     Specimen Description BLOOD RIGHT ANTECUBITAL  Final   Special Requests   Final    BOTTLES DRAWN AEROBIC AND ANAEROBIC Blood Culture adequate volume   Culture   Final    NO GROWTH 2 DAYS Performed at [facility], [address], [place], [place] [address]    Report Status PENDING  Incomplete  Urine culture     Status: None   Collection Time: 02/04/19  6:03 AM   Specimen: Urine, Random  Result Value Ref Range Status   Specimen Description URINE, RANDOM  Final   Special Requests NONE  Final   Culture   Final    NO GROWTH Performed at [facility], [address] [place], [place] [address]    Report Status 02/05/2019 FINAL  Final  Aerobic Culture (superficial specimen)     Status: None (Preliminary result)   Collection Time: 02/04/19  8:28 AM   Specimen: Foot; Wound  Result Value Ref Range Status   Specimen Description FOOT LEFT  Final   Special Requests SWAB SPEC  Final   Gram Stain   Final    FEW WBC PRESENT,BOTH PMN AND MONONUCLEAR RARE GRAM POSITIVE COCCI IN PAIRS    Culture   Final    CULTURE REINCUBATED FOR BETTER GROWTH Performed at [facility], [address] [place], [place] [address]    Report Status PENDING  Incomplete         Radiology Studies: Mr Foot Left Wo Contrast  Result Date: 02/04/2019 CLINICAL DATA:  Diabetic foot ulcer. Open wound along the lateral aspect of the forefoot at the level of the fifth MTP joint. EXAM: MRI OF THE LEFT FOOT WITHOUT CONTRAST TECHNIQUE: Multiplanar, multisequence MR imaging of the left foot was performed. No intravenous contrast was administered. COMPARISON:  Radiographs 02/03/2019 FINDINGS: Diffuse subcutaneous soft tissue swelling/edema/fluid mainly along the dorsum and lateral aspect of the forefoot extending back into the midfoot. Findings consistent with cellulitis. Is also a elongated fluid collection tracking back along the lateral margin of the fifth metatarsal worrisome for abscess. There is an  open wound noted along the plantar aspect of the foot at the level of the fifth metatarsal head. Diffuse myofasciitis without definite findings for pyomyositis. Diffuse abnormal T1 and T2 signal intensity in the fifth metatarsal head and proximal phalanx consistent with osteomyelitis and septic arthritis at the fifth MTP joint. No other definite areas of osteomyelitis are identified. IMPRESSION: 1. Septic arthritis at the fifth MTP joint and osteomyelitis involving the fifth metatarsal head and proximal phalanx. There is an overlying plantar open wound and abscess tracking back along the lateral aspect of the fifth metatarsal shaft. 2. Diffuse cellulitis and myofasciitis without findings for pyomyositis. Electronically Signed   By: Rudie Meyer M.D.   On: 02/04/2019 08:09   Dg Chest Port 1 View  Result Date: 02/05/2019 CLINICAL DATA:  Positive COVID-19 test. EXAM: PORTABLE CHEST 1 VIEW COMPARISON:  02/03/2019 and CT chest 07/27/2017. FINDINGS: Trachea is midline. Heart size is accentuated by AP semi upright technique and low lung volumes. There are peripheral and basilar areas of patchy airspace opacification. No dense airspace consolidation or pleural fluid. IMPRESSION: Peripheral and basilar airspace opacities, consistent with COVID pneumonia. Electronically Signed   By: Leanna Battles M.D.   On: 02/05/2019 11:00   Dg Chest Portable 1 View  Result Date: 02/03/2019 CLINICAL DATA:  Fever. EXAM: PORTABLE CHEST 1 VIEW COMPARISON:  June 28, 2017 FINDINGS: Evaluation of the cardiac silhouette is limited due to the low volume portable technique. The cardiac silhouette does appear little more prominent the interval. The hila and mediastinum are unchanged. No pneumothorax. Mildly coarsened lung markings bilaterally. No focal infiltrate. No other acute abnormalities. IMPRESSION: 1. Coarsened lung markings bilaterally suggests bronchitic change or atypical infection. No focal infiltrate. 2. Increased prominence of  the cardiac shadow may be due to the low volume portable technique. A PA and lateral chest x-ray could better evaluate if clinically warranted. Electronically Signed   By: Gerome Sam III M.D   On: 02/03/2019 15:28   Dg Foot 2  Views Left  Result Date: 02/03/2019 CLINICAL DATA:  Evaluate for osteomyelitis. EXAM: LEFT FOOT - 2 VIEW COMPARISON:  None. FINDINGS: A wound is seen in the lateral aspect of the foot adjacent to the fifth MTP joint. No fractures. No convincing evidence of bony erosion. IMPRESSION: Deep wound adjacent to the fifth MTP joint. No convincing evidence of bony erosion to suggest osteomyelitis. High attenuation along the skin surface is likely on the skin and not a foreign body. Recommend clinical correlation. Electronically Signed   By: Dorise Bullion III M.D   On: 02/03/2019 15:30        Scheduled Meds:  heparin  5,000 Units Subcutaneous Q8H   insulin aspart  0-15 Units Subcutaneous TID WC   insulin aspart  0-5 Units Subcutaneous QHS   insulin aspart  10 Units Subcutaneous TID WC   insulin detemir  20 Units Subcutaneous BID   sodium chloride flush  3 mL Intravenous Once   Continuous Infusions:  ceFEPime (MAXIPIME) IV 2 g (02/05/19 0946)   vancomycin 1,500 mg (02/05/19 0435)     LOS: 2 days     Alma Friendly, MD [facility]

## 2019-02-06 LAB — COMPREHENSIVE METABOLIC PANEL
ALT: 25 U/L (ref 0–44)
AST: 31 U/L (ref 15–41)
Albumin: 2.5 g/dL — ABNORMAL LOW (ref 3.5–5.0)
Alkaline Phosphatase: 56 U/L (ref 38–126)
Anion gap: 9 (ref 5–15)
BUN: 11 mg/dL (ref 6–20)
CO2: 26 mmol/L (ref 22–32)
Calcium: 8.1 mg/dL — ABNORMAL LOW (ref 8.9–10.3)
Chloride: 98 mmol/L (ref 98–111)
Creatinine, Ser: 1.02 mg/dL (ref 0.61–1.24)
GFR calc Af Amer: >60 mL/min (ref >60)
GFR calc non Af Amer: >60 mL/min (ref >60)
Glucose, Bld: 175 mg/dL — ABNORMAL HIGH (ref 70–99)
Potassium: 4.1 mmol/L (ref 3.5–5.1)
Sodium: 133 mmol/L — ABNORMAL LOW (ref 135–145)
Total Bilirubin: 0.5 mg/dL (ref 0.3–1.2)
Total Protein: 5.8 g/dL — ABNORMAL LOW (ref 6.5–8.1)

## 2019-02-06 LAB — CBC WITH DIFFERENTIAL/PLATELET
Abs Immature Granulocytes: 0.01 10*3/uL (ref 0.00–0.07)
Basophils Absolute: 0 10*3/uL (ref 0.0–0.1)
Basophils Relative: 0 %
Eosinophils Absolute: 0 10*3/uL (ref 0.0–0.5)
Eosinophils Relative: 1 %
HCT: 37.7 % — ABNORMAL LOW (ref 39.0–52.0)
Hemoglobin: 12 g/dL — ABNORMAL LOW (ref 13.0–17.0)
Immature Granulocytes: 0 %
Lymphocytes Relative: 23 %
Lymphs Abs: 0.7 10*3/uL (ref 0.7–4.0)
MCH: 27 pg (ref 26.0–34.0)
MCHC: 31.8 g/dL (ref 30.0–36.0)
MCV: 84.7 fL (ref 80.0–100.0)
Monocytes Absolute: 0.3 10*3/uL (ref 0.1–1.0)
Monocytes Relative: 9 %
Neutro Abs: 1.9 10*3/uL (ref 1.7–7.7)
Neutrophils Relative %: 67 %
Platelets: 134 10*3/uL — ABNORMAL LOW (ref 150–400)
RBC: 4.45 MIL/uL (ref 4.22–5.81)
RDW: 12.1 % (ref 11.5–15.5)
WBC: 2.9 10*3/uL — ABNORMAL LOW (ref 4.0–10.5)
nRBC: 0 % (ref 0.0–0.2)

## 2019-02-06 LAB — MAGNESIUM: Magnesium: 1.9 mg/dL (ref 1.7–2.4)

## 2019-02-06 LAB — GLUCOSE, CAPILLARY
Glucose-Capillary: 181 mg/dL — ABNORMAL HIGH (ref 70–99)
Glucose-Capillary: 190 mg/dL — ABNORMAL HIGH (ref 70–99)
Glucose-Capillary: 110 mg/dL — ABNORMAL HIGH (ref 70–99)

## 2019-02-06 LAB — C-REACTIVE PROTEIN: CRP: 6 mg/dL — ABNORMAL HIGH (ref <1.0)

## 2019-02-06 LAB — PHOSPHORUS: Phosphorus: 2.5 mg/dL (ref 2.5–4.6)

## 2019-02-06 LAB — FERRITIN: Ferritin: 357 ng/mL — ABNORMAL HIGH (ref 24–336)

## 2019-02-06 MED ORDER — VITAMIN C 1000 MG PO TABS: 1000.0000 mg | ORAL_TABLET | Freq: Every day | ORAL | 0 refills | Status: DC

## 2019-02-06 MED ORDER — ZINC SULFATE 220 (50 ZN) MG PO TABS: 1.0000 | ORAL_TABLET | Freq: Every day | ORAL | 0 refills | Status: DC

## 2019-02-06 MED ORDER — LIVING WELL WITH DIABETES BOOK
Freq: Once | Status: DC
  Filled 2019-02-06: qty 1

## 2019-02-06 MED ORDER — AMOXICILLIN-POT CLAVULANATE 875-125 MG PO TABS: 1.0000 | ORAL_TABLET | Freq: Two times a day (BID) | ORAL | 0 refills | Status: AC

## 2019-02-06 MED ORDER — AMOXICILLIN-POT CLAVULANATE 875-125 MG PO TABS: 1.0000 | ORAL_TABLET | Freq: Two times a day (BID) | ORAL | 0 refills | Status: DC

## 2019-02-06 MED ORDER — OXYCODONE HCL 5 MG PO TABS: 5.0000 mg | ORAL_TABLET | ORAL | 0 refills | Status: DC | PRN

## 2019-02-06 MED FILL — AMOX-CLAV 875-125 MG TABLET: 875-125 | 14 days supply | Qty: 28 | Fill #0

## 2019-02-06 MED FILL — VITAMIN C 500 MG TABLET: 500 | 7 days supply | Qty: 14 | Fill #0

## 2019-02-06 MED FILL — oxyCODONE HCL 5 MG TABS: 5 | 1 days supply | Qty: 5 | Fill #0

## 2019-02-06 MED FILL — ZINC SULFATE 220 MG TABLET: 220 (50 ZN) | 7 days supply | Qty: 7 | Fill #0

## 2019-02-06 NOTE — Progress Notes (Addendum)
Daily Nurse Notes  Received report from [place], Therapist, sports. Patient in good spirits listening to music upon assessment. Changed L foot wound. Spoke to Dr. Wyline Copas who may consider discharging patient today depending on ID's thoughts regarding antibiotics.   Plan for patient to be discharged this evening on PO Augmentin. Reviewed discharge medications. All questions answered.  Transition of care pharmacy delivered medications.   Patient discharged in early evening.

## 2019-02-06 NOTE — Progress Notes (Addendum)
Pharmacy Antibiotic Note  [name] is a 33 y.o. male admitted on 02/03/2019 with sepsis.  Pharmacy consulted 11/1 for vancomycin and cefepime dosing. Patient is COVID + with Remdesivir ordered but patient is refusing.   Left foot wound is positive for osteomyelitis which podiatry plans to manage outpatient per progress note 11/3.   Wound culture is growing few GNR and staphylococcus lugdunensis - full speciation and susceptibilities are pending.   WBC is low, stable at 2.9.  Tmax in last 24 hrs is down at 100.1.  SCr is stable at 1.02  (CrCl >100 mL/min).    Plan: Continue Vancomycin 1500 mg IV every 12 hours  (estAUC450, SCR 1.02,  Vd 0.5 d/t BMI >30)  Continue Cefepime 2 g IV every 8 hours Follow-up cultures - likely can narrow once resulted to oral antibiotics. Monitor renal fx, cx results, clinical pic, and vanc levels as indicated  Height: 6\' 5"  (195.6 cm) Weight: 269 lb 10 oz (122.3 kg) IBW/kg (Calculated) : 89.1  Temp (24hrs), Avg:100.5 F (38.1 C), Min:99.2 F (37.3 C), Max:102.1 F (38.9 C)  Recent Labs  Lab 02/03/19 1258 02/03/19 1412 02/03/19 1604 02/03/19 1618 02/03/19 2034 02/04/19 0220 02/05/19 0340 02/06/19 0354  WBC 4.2 3.5*  --   --   --  2.3* 2.8* 2.9*  CREATININE 1.39* 1.50*  --  1.30*  --  1.02 1.08 1.02  LATICACIDVEN  --  1.5 2.0*  --  1.8  --   --   --     Estimated Creatinine Clearance: 150.6 mL/min (by C-G formula based on SCr of 1.02 mg/dL).    Allergies  Allergen Reactions  . Basaglar Claiborne Rigg [Insulin Glargine] Diarrhea  . Metformin And Related Diarrhea and Nausea And Vomiting  . Trulicity [Dulaglutide] Diarrhea    Antimicrobials this admission: Vancomycin 11/1 >>  Cefepime 11/1 >>  Zosyn 11/1 x1 Flagyl 11/2>>  Dose adjustments this admission: N/A  Microbiology results:  1/1 BCx: NGTD 11/1 UCx: sent  11/1 Wound: sent  11/2 Ucx : sent 11/2 aerobic cx Wound rt foot: few GNR and few staph lugdunesis 11/1 COVID:  detected  Thank you for allowing pharmacy to be a part of this patient's care.  Sloan Leiter, PharmD, BCPS, BCCCP Clinical Pharmacist Please refer to [facility] for [place] numbers 02/06/2019 12:45 PM

## 2019-02-06 NOTE — Evaluation (Signed)
Physical Therapy Evaluation Patient Details Name: [name] MRN: [id number] DOB: November 22, 1985 Today's Date: 02/06/2019   History of Present Illness  33 y.o. male h/o uncontrolled DM, insulin-dependent, and foot ulcer positive for COVID-19 originally presented to the urgent care center with fever, dry cough, generalized fatigue and body aches.  Patient was being treated at the wound care clinic and scheduled for MRI regarding an ulcer to the left foot that was chronic.  MRI performed while inpatient yesterday, 02/04/2019, positive for osteomyelitis of the fifth ray left foot.   Clinical Impression  Patient evaluated by Physical Therapy with no further acute PT needs identified. All education has been completed and the patient has no further questions. RN to assist in providing padding to Darco shoe for home use until he can return to wound center. PT with no need for any follow-up Physical Therapy or equipment. PT is signing off. Thank you for this referral.     Follow Up Recommendations No PT follow up    Equipment Recommendations  None recommended by PT    Recommendations for Other Services       Precautions / Restrictions Precautions Precautions: None Restrictions Weight Bearing Restrictions: No      Mobility  Bed Mobility Overal bed mobility: Independent                Transfers Overall transfer level: Independent                  Ambulation/Gait Ambulation/Gait assistance: Modified independent (Device/Increase time)   Assistive device: Rolling walker (2 wheeled) Gait Pattern/deviations: WFL(Within Functional Limits) Gait velocity: slowed Gait velocity interpretation: 1.31 - 2.62 ft/sec, indicative of limited community ambulator General Gait Details: initially used RW to steady but progressed to no AD vc for weightbearing through L heel only to aid in wound healing      Balance Overall balance assessment: Mild deficits observed, not formally tested                                            Pertinent Vitals/Pain Pain Assessment: No/denies pain    Home Living Family/patient expects to be discharged to:: Private residence Living Arrangements: Spouse/significant other Available Help at Discharge: Family;Available PRN/intermittently Type of Home: House Home Access: Level entry     Home Layout: Two level;Able to live on main level with bedroom/bathroom Home Equipment: None      Prior Function Level of Independence: Independent                  Extremity/Trunk Assessment   Upper Extremity Assessment Upper Extremity Assessment: Overall WFL for tasks assessed    Lower Extremity Assessment Lower Extremity Assessment: LLE deficits/detail LLE Deficits / Details: L forefoot bandaged, ROM of ankle, knee and hip WFL LLE Sensation: decreased light touch(toes to midfoot)       Communication   Communication: No difficulties  Cognition Arousal/Alertness: Awake/alert Behavior During Therapy: Flat affect Overall Cognitive Status: Within Functional Limits for tasks assessed                                        General Comments General comments (skin integrity, edema, etc.): Pt L forefoot with clean, dry bandage. Pt with Darco shoe with padding cut to offweight ulcer, padding is soiled and  flattend. Pt refuses to wear for ambulation in room. Discussed with RN about creating padding for shoe before d/c home        Assessment/Plan    PT Assessment Patent does not need any further PT services         PT Goals (Current goals can be found in the Care Plan section)  Acute Rehab PT Goals Patient Stated Goal: go home PT Goal Formulation: With patient     AM-PAC PT "6 Clicks" Mobility  Outcome Measure Help needed turning from your back to your side while in a flat bed without using bedrails?: None Help needed moving from lying on your back to sitting on the side of a flat bed without  using bedrails?: None Help needed moving to and from a bed to a chair (including a wheelchair)?: None Help needed standing up from a chair using your arms (e.g., wheelchair or bedside chair)?: None Help needed to walk in hospital room?: None Help needed climbing 3-5 steps with a railing? : None 6 Click Score: 24    End of Session   Activity Tolerance: Patient tolerated treatment well Patient left: in bed;with call bell/phone within reach Nurse Communication: Mobility status;Other (comment)(padding for Darco shoe)      Time: 1540-1603 PT Time Calculation (min) (ACUTE ONLY): 23 min   Charges:   PT Evaluation $PT Eval Moderate Complexity: 1 Mod PT Treatments $Gait Training: 8-22 mins        [name] PT, DPT Acute Rehabilitation Services Pager [phone] Office [phone]   [name] 02/06/2019, 5:00 PM

## 2019-02-06 NOTE — Discharge Summary (Signed)
Physician Discharge Summary  [name] Vanlanen ZOX:[id number] DOB: January 15, 1986 DOA: 02/03/2019  PCP: Renford DillsPolite, Ronald, MD  Admit date: 02/03/2019 Discharge date: 02/06/2019  Admitted From: Home Disposition:  Home  Recommendations for Outpatient Follow-up:  1. Follow up with PCP in 1-2 weeks 2. Follow up with Podiatry as scheduled  Discharge Condition:Stable CODE STATUS:Full Diet recommendation: Diabetic   Brief/Interim Summary: 33 y.o.malewith medical history significant ofinsulin-dependent type 2 diabetes, chronic left diabetic foot ulcer presented to urgent care center with temperature of 100.7, dry cough, generalized fatigue and body ache for at least last 3 days PTA. No sick contacts. Denies any COVID-19 exposure at home or at work. Patient does have a diabetic foot ulcer on his left fifth metatarsal head, has seen different providers, recently seen at wound care clinic and planned for an MRI. In the urgent care , he was found with temperature 102.5, blood pressures 92/50. Blood pressure responded to IV fluids.  X-ray showed soft tissue swelling no obvious bony involvement. Patient was started on broad-spectrum antibiotics.  Patient's Covid test was positive.  Pt admitted for further management.   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 Have discussed case with ID who recommends 2 weeks of PO augmentin and to follow up with podiatry as scheduled -MRI with evidence of osteomyelitis of the fifth metatarsal head and surrounding inflammation -Seen by Podiatry with recommendation to complete quarantine for COVID and pursue surgery as outpt.  Podiatry has since signed off  Pneumonia 2/2 COVID-19 virus -Currently afebrile, with leukopenia -Currently maintaining sats around 90% on room air -CRP elevated (although patient has osteomyelitis) -Procalcitonin negative -D-dimer 0.53 -Chest x-ray showed basilar airspace opacities consistent with Covid pneumonia -Started steroids and remdesivir, although pt has refused remdesivir  Hyponatremia Chronic, unclear etiology Much improved  Insulin-dependent type 2 diabetes with hyperglycemia A1c 9 Continue SSI, NovoLog 3 times daily, Levemir twice daily while in hospital Cont home regimen on d/c   Discharge Instructions   Allergies as of 02/06/2019      Reactions   Basaglar Kwikpen [insulin Glargine] Diarrhea   Metformin And Related Diarrhea, Nausea And Vomiting   Trulicity [dulaglutide] Diarrhea      Medication List    STOP taking these medications   clindamycin 150 MG capsule Commonly known as: CLEOCIN     TAKE these medications   acetaminophen 500 MG tablet Commonly known as: TYLENOL Take 500-1,000 mg by mouth every 6 (six) hours as needed for mild pain, fever or headache.   amoxicillin-clavulanate 875-125 MG tablet Commonly known as: Augmentin Take 1 tablet by mouth 2 (two) times daily for 14 days.   Lantus SoloStar 100 UNIT/ML Solostar Pen Generic drug: Insulin Glargine Inject 40 Units into the skin daily before breakfast. What changed: Another medication with the same name was changed. Make sure you understand how and when to take each.   Basaglar KwikPen 100 UNIT/ML Sopn Inject 0.24 mLs (24 Units total) into the skin at bedtime. What changed:   how much to take  when to take this   FreeStyle Libre 14 Day Sensor Misc Inject 1 patch into the skin every 14 (fourteen) days.   Continuous  Blood Gluc Sensor Misc 1 each by Does not apply route as directed. Use as directed every 14 days. May dispense FreeStyle [employer] or similar.   Emergen-C  Immune Pack Take 1 packet by mouth daily as needed (for supplementation).   insulin aspart 100 UNIT/ML injection Commonly known as: novoLOG Inject 0-15 Units into the skin 3 (three) times daily with meals. What changed:   how much to take  when to take this  reasons to take this   multivitamin Tabs tablet Take 1 tablet by mouth daily with breakfast.   oxyCODONE 5 MG immediate release tablet Commonly known as: Oxy IR/ROXICODONE Take 1 tablet (5 mg total) by mouth every 4 (four) hours as needed for severe pain.   vitamin C 1000 MG tablet Take 1 tablet (1,000 mg total) by mouth daily for 7 days.   Zinc Sulfate 220 (50 Zn) MG Tabs Take 1 tablet (220 mg total) by mouth daily.      Follow-up Information    Edrick Kins, DPM. Schedule an appointment as soon as possible for a visit.   Specialty: Podiatry Contact information: [address] [place] [address] [phone]        [name], MD. Schedule an appointment as soon as possible for a visit in 2 week(s).   Specialty: Internal Medicine Contact information: [address] [place] [address] [phone]          Allergies  Allergen Reactions  . Basaglar Claiborne Rigg [Insulin Glargine] Diarrhea  . Metformin And Related Diarrhea and Nausea And Vomiting  . Trulicity [Dulaglutide] Diarrhea    Consultations:  Podiatry  Discussed case with ID  Procedures/Studies: Mr Foot Left Wo Contrast  Result Date: 02/04/2019 CLINICAL DATA:  Diabetic foot ulcer. Open wound along the lateral aspect of the forefoot at the level of the fifth MTP joint. EXAM: MRI OF THE LEFT FOOT WITHOUT CONTRAST TECHNIQUE: Multiplanar, multisequence MR imaging of the left foot was performed. No intravenous contrast was administered. COMPARISON:  Radiographs 02/03/2019 FINDINGS: Diffuse subcutaneous soft tissue swelling/edema/fluid mainly along the dorsum and lateral aspect of the forefoot extending back into  the midfoot. Findings consistent with cellulitis. Is also a elongated fluid collection tracking back along the lateral margin of the fifth metatarsal worrisome for abscess. There is an open wound noted along the plantar aspect of the foot at the level of the fifth metatarsal head. Diffuse myofasciitis without definite findings for pyomyositis. Diffuse abnormal T1 and T2 signal intensity in the fifth metatarsal head and proximal phalanx consistent with osteomyelitis and septic arthritis at the fifth MTP joint. No other definite areas of osteomyelitis are identified. IMPRESSION: 1. Septic arthritis at the fifth MTP joint and osteomyelitis involving the fifth metatarsal head and proximal phalanx. There is an overlying plantar open wound and abscess tracking back along the lateral aspect of the fifth metatarsal shaft. 2. Diffuse cellulitis and myofasciitis without findings for pyomyositis. Electronically Signed   By: Marijo Sanes M.D.   On: 02/04/2019 08:09   Dg Chest Port 1 View  Result Date: 02/05/2019 CLINICAL DATA:  Positive COVID-19 test. EXAM: PORTABLE CHEST 1 VIEW COMPARISON:  02/03/2019 and CT chest 07/27/2017. FINDINGS: Trachea is midline. Heart size is accentuated by AP semi upright technique and low lung volumes. There are peripheral and basilar areas of patchy airspace opacification. No dense airspace consolidation or pleural fluid. IMPRESSION: Peripheral and basilar airspace opacities, consistent with COVID pneumonia. Electronically Signed   By: Rip Harbour  Blietz M.D.   On: 02/05/2019 11:00   Dg Chest Portable 1 View  Result Date: 02/03/2019 CLINICAL DATA:  Fever. EXAM: PORTABLE CHEST 1 VIEW COMPARISON:  June 28, 2017 FINDINGS: Evaluation of the cardiac silhouette is limited due to the low volume portable technique. The cardiac silhouette does appear little more prominent the interval. The hila and mediastinum are unchanged. No pneumothorax. Mildly coarsened lung markings bilaterally. No focal  infiltrate. No other acute abnormalities. IMPRESSION: 1. Coarsened lung markings bilaterally suggests bronchitic change or atypical infection. No focal infiltrate. 2. Increased prominence of the cardiac shadow may be due to the low volume portable technique. A PA and lateral chest x-ray could better evaluate if clinically warranted. Electronically Signed   By: Gerome Sam III M.D   On: 02/03/2019 15:28   Dg Foot 2 Views Left  Result Date: 02/03/2019 CLINICAL DATA:  Evaluate for osteomyelitis. EXAM: LEFT FOOT - 2 VIEW COMPARISON:  None. FINDINGS: A wound is seen in the lateral aspect of the foot adjacent to the fifth MTP joint. No fractures. No convincing evidence of bony erosion. IMPRESSION: Deep wound adjacent to the fifth MTP joint. No convincing evidence of bony erosion to suggest osteomyelitis. High attenuation along the skin surface is likely on the skin and not a foreign body. Recommend clinical correlation. Electronically Signed   By: Gerome Sam III M.D   On: 02/03/2019 15:30   Dg Foot Complete Left  Result Date: 01/12/2019 CLINICAL DATA:  Nonhealing ulcer. EXAM: LEFT FOOT - COMPLETE 3+ VIEW COMPARISON:  April 13, 2016 FINDINGS: There is no evidence of fracture or dislocation. There is no evidence of arthropathy or other focal bone abnormality. No cortical destructive changes. Soft tissue swelling with prominent soft tissue emphysema overlies the fifth metatarsophalangeal joint. IMPRESSION: 1. No radiographic evidence of osteomyelitis. 2. Soft tissue swelling with prominent soft tissue emphysema overlies the fifth metatarsophalangeal joint. The soft tissue emphysema may be due to gas tracking through an open wound or represent infection with gas-forming organism/gangrene. Electronically Signed   By: Ted Mcalpine M.D.   On: 01/12/2019 15:06     Subjective: Eager to go home  Discharge Exam: Vitals:   02/05/19 1700 02/05/19 2240  BP:  118/74  Pulse:  90  Resp:  20  Temp:  99.2 F (37.3 C) 100.1 F (37.8 C)  SpO2:  91%   Vitals:   02/05/19 0737 02/05/19 1500 02/05/19 1700 02/05/19 2240  BP: 136/81 137/77  118/74  Pulse: 89 96  90  Resp: 18   20  Temp: 99.6 F (37.6 C) (!) 102.1 F (38.9 C) 99.2 F (37.3 C) 100.1 F (37.8 C)  TempSrc: Oral Oral Oral Oral  SpO2: 90% 93%  91%  Weight:      Height:        General: Pt is alert, awake, not in acute distress Cardiovascular: RRR, S1/S2 +, no rubs, no gallops Respiratory: CTA bilaterally, no wheezing, no rhonchi Abdominal: Soft, NT, ND, bowel sounds + Extremities: no edema, no cyanosis   The results of significant diagnostics from this hospitalization (including imaging, microbiology, ancillary and laboratory) are listed below for reference.     Microbiology: Recent Results (from the past 240 hour(s))  Novel Coronavirus, NAA (Hosp order, Send-out to Ref Lab; TAT 18-24 hrs     Status: Abnormal   Collection Time: 02/03/19  1:01 PM   Specimen: Nasopharyngeal Swab; Respiratory  Result Value Ref Range Status   SARS-CoV-2, NAA DETECTED (A) NOT DETECTED Final  Comment: RESULT CALLED TO, READ BACK BY AND VERIFIED WITH: J. [name], RN AT 1220 ON 02/04/19 BY C. JESSUP, MT. (NOTE)                  Client Requested Flag This nucleic acid amplification test was developed and its performance characteristics determined by [employer]. Nucleic acid amplification tests include PCR and TMA. This test has not been FDA cleared or approved. This test has been authorized by FDA under an Emergency Use Authorization (EUA). This test is only authorized for the duration of time the declaration that circumstances exist justifying the authorization of the emergency use of in vitro diagnostic tests for detection of SARS-CoV-2 virus and/or diagnosis of COVID-19 infection under section 564(b)(1) of the Act, 21 U.S.C. 295AOZ-3(Y) (1), unless the authorization is terminated or revoked sooner. When diagnostic testing  is negative, the possibility of a false negative result should be considered in the context of a patient's recent exposures and the presenc e of clinical signs and symptoms consistent with COVID-19. An individual without symptoms of COVID- 19 and who is not shedding SARS-CoV-2 virus would expect to have a negative (not detected) result in this assay. Performed At: [facility] [address] [place], [place] [id number] Jolene Schimke MD EX:[id number]    Coronavirus Source NASOPHARYNGEAL  Final    Comment: Performed at [facility], [address] [place], [place] [address]  SARS CORONAVIRUS 2 (TAT 6-24 HRS) Nasopharyngeal Nasopharyngeal Swab     Status: Abnormal   Collection Time: 02/03/19  3:22 PM   Specimen: Nasopharyngeal Swab  Result Value Ref Range Status   SARS Coronavirus 2 POSITIVE (A) NEGATIVE Final    Comment: RESULT CALLED TO, READ BACK BY AND VERIFIED WITH: B.LITTLE RN 1959 02/04/2019 MCCORMICK K (NOTE) SARS-CoV-2 target nucleic acids are DETECTED. The SARS-CoV-2 RNA is generally detectable in upper and lower respiratory specimens during the acute phase of infection. Positive results are indicative of active infection with SARS-CoV-2. Clinical  correlation with patient history and other diagnostic information is necessary to determine patient infection status. Positive results do  not rule out bacterial infection or co-infection with other viruses. The expected result is Negative. Fact Sheet for Patients: HairSlick.no Fact Sheet for Healthcare Providers: quierodirigir.com This test is not yet approved or cleared by the [place] FDA and  has been authorized for detection and/or diagnosis of SARS-CoV-2 by FDA under an Emergency Use Authorization (EUA). This EUA will remain  in effect (meaning this test can be used) f or the duration of the COVID-19 declaration under Section 564(b)(1) of the Act,  21 U.S.C. section 360bbb-3(b)(1), unless the authorization is terminated or revoked sooner. Performed at [facility], [address], [place], [place] [address]   Blood Culture (routine x 2)     Status: None (Preliminary result)   Collection Time: 02/03/19  3:53 PM   Specimen: BLOOD  Result Value Ref Range Status   Specimen Description BLOOD LEFT ANTECUBITAL  Final   Special Requests   Final    BOTTLES DRAWN AEROBIC AND ANAEROBIC Blood Culture adequate volume   Culture   Final    NO GROWTH 3 DAYS Performed at [facility], [address], [place], [place] [address]    Report Status PENDING  Incomplete  Blood Culture (routine x 2)     Status: None (Preliminary result)   Collection Time: 02/03/19  4:11 PM   Specimen: BLOOD  Result Value Ref Range Status  Specimen Description BLOOD RIGHT ANTECUBITAL  Final   Special Requests   Final    BOTTLES DRAWN AEROBIC AND ANAEROBIC Blood Culture adequate volume   Culture   Final    NO GROWTH 3 DAYS Performed at [facility], [address] [place], [place] [address]    Report Status PENDING  Incomplete  Urine culture     Status: None   Collection Time: 02/04/19  6:03 AM   Specimen: Urine, Random  Result Value Ref Range Status   Specimen Description URINE, RANDOM  Final   Special Requests NONE  Final   Culture   Final    NO GROWTH Performed at [facility], [address] [place], [place] [address]    Report Status 02/05/2019 FINAL  Final  Aerobic Culture (superficial specimen)     Status: None (Preliminary result)   Collection Time: 02/04/19  8:28 AM   Specimen: Foot; Wound  Result Value Ref Range Status   Specimen Description FOOT LEFT  Final   Special Requests SWAB SPEC  Final   Gram Stain   Final    FEW WBC PRESENT,BOTH PMN AND MONONUCLEAR RARE GRAM POSITIVE COCCI IN PAIRS Performed at [facility], [address], [place], [place] [address]    Culture   Final    FEW GRAM NEGATIVE RODS FEW  STAPHYLOCOCCUS LUGDUNENSIS    Report Status PENDING  Incomplete     Labs: BNP (last 3 results) Recent Labs    02/04/19 1630  BNP 6.8   Basic Metabolic Panel: Recent Labs  Lab 02/03/19 1258 02/03/19 1412 02/03/19 1618 02/04/19 0220 02/05/19 0340 02/06/19 0354  NA 129* 128* 131* 132* 129* 133*  K 5.1 5.9* 4.5 4.3 4.3 4.1  CL 92* 93* 91* 99 95* 98  CO2 27 25  --  GLUCOSE 411* 401* 403* 312* 228* 175*  BUN CREATININE 1.39* 1.50* 1.30* 1.02 1.08 1.02  CALCIUM 9.0 8.9  --  8.2* 8.3* 8.1*  MG  --   --   --  1.9 1.9 1.9  PHOS  --   --   --  3.1 2.6 2.5   Liver Function Tests: Recent Labs  Lab 02/03/19 1412 02/04/19 1630 02/05/19 0340 02/06/19 0354  AST ALT 39 ALKPHOS 82 65 59 56  BILITOT 0.5 0.8 0.7 0.5  PROT 7.3 6.4* 6.2* 5.8*  ALBUMIN 3.4* 2.9* 2.8* 2.5*   No results for input(s): LIPASE, AMYLASE in the last 168 hours. No results for input(s): AMMONIA in the last 168 hours. CBC: Recent Labs  Lab 02/03/19 1258 02/03/19 1412 02/03/19 1618 02/04/19 0220 02/05/19 0340 02/06/19 0354  WBC 4.2 3.5*  --  2.3* 2.8* 2.9*  NEUTROABS  --  2.5  --   --  2.0 1.9  HGB 13.5 13.4 16.0 11.8* 12.8* 12.0*  HCT 42.3 41.4 47.0 37.0* 39.7 37.7*  MCV 84.9 84.5  --  85.1 85.0 84.7  PLT 157 168  --  130* 134* 134*   Cardiac Enzymes: No results for input(s): CKTOTAL, CKMB, CKMBINDEX, TROPONINI in the last 168 hours. BNP: Invalid input(s): POCBNP CBG: Recent Labs  Lab 02/05/19 1713 02/05/19 2032 02/06/19 0723 02/06/19 1133 02/06/19 1618  GLUCAP 104* 133* 181* 190* 110*   D-Dimer Recent Labs    02/04/19 1630 02/05/19 0340  DDIMER 0.62* 0.53*   Hgb A1c No results for  input(s): HGBA1C in the last 72 hours. Lipid Profile No results for input(s): CHOL, HDL, LDLCALC, TRIG, CHOLHDL, LDLDIRECT in the last 72 hours. Thyroid function studies No results for input(s): TSH, T4TOTAL, T3FREE, THYROIDAB in the last 72  hours.  Invalid input(s): FREET3 Anemia work up Recent Labs    02/05/19 0340 02/06/19 0354  FERRITIN 305 357*   Urinalysis    Component Value Date/Time   COLORURINE YELLOW 02/04/2019 0603   APPEARANCEUR CLEAR 02/04/2019 0603   LABSPEC 1.026 02/04/2019 0603   PHURINE 5.0 02/04/2019 0603   GLUCOSEU >=500 (A) 02/04/2019 0603   HGBUR NEGATIVE 02/04/2019 0603   BILIRUBINUR NEGATIVE 02/04/2019 0603   KETONESUR 5 (A) 02/04/2019 0603   PROTEINUR 100 (A) 02/04/2019 0603   UROBILINOGEN 0.2 11/07/2011 2241   NITRITE NEGATIVE 02/04/2019 0603   LEUKOCYTESUR NEGATIVE 02/04/2019 0603   Sepsis Labs Invalid input(s): PROCALCITONIN,  WBC,  LACTICIDVEN Microbiology Recent Results (from the past 240 hour(s))  Novel Coronavirus, NAA (Hosp order, Send-out to Ref Lab; TAT 18-24 hrs     Status: Abnormal   Collection Time: 02/03/19  1:01 PM   Specimen: Nasopharyngeal Swab; Respiratory  Result Value Ref Range Status   SARS-CoV-2, NAA DETECTED (A) NOT DETECTED Final    Comment: RESULT CALLED TO, READ BACK BY AND VERIFIED WITH: J. [name], RN AT 1220 ON 02/04/19 BY C. JESSUP, MT. (NOTE)                  Client Requested Flag This nucleic acid amplification test was developed and its performance characteristics determined by [employer]. Nucleic acid amplification tests include PCR and TMA. This test has not been FDA cleared or approved. This test has been authorized by FDA under an Emergency Use Authorization (EUA). This test is only authorized for the duration of time the declaration that circumstances exist justifying the authorization of the emergency use of in vitro diagnostic tests for detection of SARS-CoV-2 virus and/or diagnosis of COVID-19 infection under section 564(b)(1) of the Act, 21 U.S.C. 161WRU-0(A) (1), unless the authorization is terminated or revoked sooner. When diagnostic testing is negative, the possibility of a false negative result should be considered in the  context of a patient's recent exposures and the presenc e of clinical signs and symptoms consistent with COVID-19. An individual without symptoms of COVID- 19 and who is not shedding SARS-CoV-2 virus would expect to have a negative (not detected) result in this assay. Performed At: [facility] [address] [place], [place] [id number] Jolene Schimke MD YN:[id number]    Coronavirus Source NASOPHARYNGEAL  Final    Comment: Performed at [facility], [address] [place], [place] [address]  SARS CORONAVIRUS 2 (TAT 6-24 HRS) Nasopharyngeal Nasopharyngeal Swab     Status: Abnormal   Collection Time: 02/03/19  3:22 PM   Specimen: Nasopharyngeal Swab  Result Value Ref Range Status   SARS Coronavirus 2 POSITIVE (A) NEGATIVE Final    Comment: RESULT CALLED TO, READ BACK BY AND VERIFIED WITH: B.LITTLE RN 1959 02/04/2019 MCCORMICK K (NOTE) SARS-CoV-2 target nucleic acids are DETECTED. The SARS-CoV-2 RNA is generally detectable in upper and lower respiratory specimens during the acute phase of infection. Positive results are indicative of active infection with SARS-CoV-2. Clinical  correlation with patient history and other diagnostic information is necessary to determine patient infection status. Positive results do  not rule out bacterial infection or co-infection with other viruses. The expected result is Negative. Fact Sheet for Patients: HairSlick.no Fact Sheet for  Healthcare Providers: quierodirigir.com This test is not yet approved or cleared by the [place] and  has been authorized for detection and/or diagnosis of SARS-CoV-2 by FDA under an Emergency Use Authorization (EUA). This EUA will remain  in effect (meaning this test can be used) f or the duration of the COVID-19 declaration under Section 564(b)(1) of the Act, 21 U.S.C. section 360bbb-3(b)(1), unless the authorization is terminated or revoked  sooner. Performed at [facility], [address], [place], [place] [address]   Blood Culture (routine x 2)     Status: None (Preliminary result)   Collection Time: 02/03/19  3:53 PM   Specimen: BLOOD  Result Value Ref Range Status   Specimen Description BLOOD LEFT ANTECUBITAL  Final   Special Requests   Final    BOTTLES DRAWN AEROBIC AND ANAEROBIC Blood Culture adequate volume   Culture   Final    NO GROWTH 3 DAYS Performed at [facility], [address] [place], [place] [address]    Report Status PENDING  Incomplete  Blood Culture (routine x 2)     Status: None (Preliminary result)   Collection Time: 02/03/19  4:11 PM   Specimen: BLOOD  Result Value Ref Range Status   Specimen Description BLOOD RIGHT ANTECUBITAL  Final   Special Requests   Final    BOTTLES DRAWN AEROBIC AND ANAEROBIC Blood Culture adequate volume   Culture   Final    NO GROWTH 3 DAYS Performed at [facility], [address] [place], [place] [address]    Report Status PENDING  Incomplete  Urine culture     Status: None   Collection Time: 02/04/19  6:03 AM   Specimen: Urine, Random  Result Value Ref Range Status   Specimen Description URINE, RANDOM  Final   Special Requests NONE  Final   Culture   Final    NO GROWTH Performed at [facility], [address], [place], [place] [address]    Report Status 02/05/2019 FINAL  Final  Aerobic Culture (superficial specimen)     Status: None (Preliminary result)   Collection Time: 02/04/19  8:28 AM   Specimen: Foot; Wound  Result Value Ref Range Status   Specimen Description FOOT LEFT  Final   Special Requests SWAB SPEC  Final   Gram Stain   Final    FEW WBC PRESENT,BOTH PMN AND MONONUCLEAR RARE GRAM POSITIVE COCCI IN PAIRS Performed at [facility], [address], [place], [place] [address]    Culture   Final    FEW Romie Minus NEGATIVE RODS FEW STAPHYLOCOCCUS LUGDUNENSIS    Report Status PENDING  Incomplete   Time spent:   SIGNED:   Rickey Barbara, MD  [facility] 02/06/2019, 4:47 PM  If 7PM-7AM, please contact night-coverage

## 2019-02-07 ENCOUNTER — Inpatient Hospital Stay (HOSPITAL_COMMUNITY)
Admission: EM | Admit: 2019-02-07 | Discharge: 2019-02-14 | Disposition: A | Discharge status: Home / Self Care | Payer: BC Managed Care – PPO | Source: Home / Self Care | Attending: Internal Medicine | Admitting: Internal Medicine

## 2019-02-07 ENCOUNTER — Emergency Department (HOSPITAL_COMMUNITY): Payer: BC Managed Care – PPO

## 2019-02-07 ENCOUNTER — Other Ambulatory Visit: Payer: Self-pay

## 2019-02-07 ENCOUNTER — Encounter (HOSPITAL_COMMUNITY): Payer: Self-pay

## 2019-02-07 DIAGNOSIS — U071 COVID-19: Secondary | ICD-10-CM

## 2019-02-07 DIAGNOSIS — J9601 Acute respiratory failure with hypoxia: Secondary | ICD-10-CM | POA: Diagnosis present

## 2019-02-07 DIAGNOSIS — J1289 Other viral pneumonia: Secondary | ICD-10-CM

## 2019-02-07 DIAGNOSIS — J129 Viral pneumonia, unspecified: Secondary | ICD-10-CM

## 2019-02-07 DIAGNOSIS — E1065 Type 1 diabetes mellitus with hyperglycemia: Secondary | ICD-10-CM

## 2019-02-07 DIAGNOSIS — J1282 Pneumonia due to coronavirus disease 2019: Secondary | ICD-10-CM | POA: Diagnosis present

## 2019-02-07 LAB — CBC WITH DIFFERENTIAL/PLATELET
Abs Immature Granulocytes: 0.08 10*3/uL — ABNORMAL HIGH (ref 0.00–0.07)
Basophils Absolute: 0 10*3/uL (ref 0.0–0.1)
Basophils Relative: 0 %
Eosinophils Absolute: 0.2 10*3/uL (ref 0.0–0.5)
Eosinophils Relative: 3 %
HCT: 39.6 % (ref 39.0–52.0)
Hemoglobin: 12.5 g/dL — ABNORMAL LOW (ref 13.0–17.0)
Immature Granulocytes: 1 %
Lymphocytes Relative: 12 %
Lymphs Abs: 0.8 10*3/uL (ref 0.7–4.0)
MCH: 27.2 pg (ref 26.0–34.0)
MCHC: 31.6 g/dL (ref 30.0–36.0)
MCV: 86.1 fL (ref 80.0–100.0)
Monocytes Absolute: 0.6 10*3/uL (ref 0.1–1.0)
Monocytes Relative: 8 %
Neutro Abs: 5.6 10*3/uL (ref 1.7–7.7)
Neutrophils Relative %: 76 %
Platelets: 161 10*3/uL (ref 150–400)
RBC: 4.6 MIL/uL (ref 4.22–5.81)
RDW: 12.3 % (ref 11.5–15.5)
WBC: 7.3 10*3/uL (ref 4.0–10.5)
nRBC: 0 % (ref 0.0–0.2)

## 2019-02-07 LAB — COMPREHENSIVE METABOLIC PANEL
ALT: 27 U/L (ref 0–44)
AST: 46 U/L — ABNORMAL HIGH (ref 15–41)
Albumin: 2.7 g/dL — ABNORMAL LOW (ref 3.5–5.0)
Alkaline Phosphatase: 59 U/L (ref 38–126)
Anion gap: 8 (ref 5–15)
BUN: 13 mg/dL (ref 6–20)
CO2: 27 mmol/L (ref 22–32)
Calcium: 7.9 mg/dL — ABNORMAL LOW (ref 8.9–10.3)
Chloride: 91 mmol/L — ABNORMAL LOW (ref 98–111)
Creatinine, Ser: 1.13 mg/dL (ref 0.61–1.24)
GFR calc Af Amer: >60 mL/min (ref >60)
GFR calc non Af Amer: >60 mL/min (ref >60)
Glucose, Bld: 415 mg/dL — ABNORMAL HIGH (ref 70–99)
Potassium: 4.5 mmol/L (ref 3.5–5.1)
Sodium: 126 mmol/L — ABNORMAL LOW (ref 135–145)
Total Bilirubin: 0.8 mg/dL (ref 0.3–1.2)
Total Protein: 6.4 g/dL — ABNORMAL LOW (ref 6.5–8.1)

## 2019-02-07 LAB — CBG MONITORING, ED: Glucose-Capillary: 362 mg/dL — ABNORMAL HIGH (ref 70–99)

## 2019-02-07 LAB — PROCALCITONIN: Procalcitonin: 0.13 ng/mL

## 2019-02-07 LAB — FERRITIN: Ferritin: 487 ng/mL — ABNORMAL HIGH (ref 24–336)

## 2019-02-07 LAB — LACTIC ACID, PLASMA
Lactic Acid, Venous: 1.3 mmol/L (ref 0.5–1.9)
Lactic Acid, Venous: 1.4 mmol/L (ref 0.5–1.9)

## 2019-02-07 LAB — FIBRINOGEN: Fibrinogen: 584 mg/dL — ABNORMAL HIGH (ref 210–475)

## 2019-02-07 LAB — TRIGLYCERIDES: Triglycerides: 118 mg/dL (ref <150)

## 2019-02-07 LAB — C-REACTIVE PROTEIN: CRP: 9.7 mg/dL — ABNORMAL HIGH (ref <1.0)

## 2019-02-07 LAB — D-DIMER, QUANTITATIVE: D-Dimer, Quant: 1.52 ug/mL-FEU — ABNORMAL HIGH (ref 0.00–0.50)

## 2019-02-07 LAB — LACTATE DEHYDROGENASE: LDH: 468 U/L — ABNORMAL HIGH (ref 98–192)

## 2019-02-07 MED ORDER — SODIUM CHLORIDE 0.9 % IV SOLN
100.0000 mg | INTRAVENOUS | Status: AC
  Administered 2019-02-09 – 2019-02-11 (×3): 100 mg via INTRAVENOUS
  Filled 2019-02-07 (×4): qty 20

## 2019-02-07 MED ORDER — DEXAMETHASONE SODIUM PHOSPHATE 10 MG/ML IJ SOLN
6.0000 mg | Freq: Once | INTRAMUSCULAR | Status: AC
  Administered 2019-02-07: 6 mg via INTRAVENOUS
  Filled 2019-02-07: qty 1

## 2019-02-07 MED ORDER — ENOXAPARIN SODIUM 60 MG/0.6ML ~~LOC~~ SOLN
60.0000 mg | Freq: Every day | SUBCUTANEOUS | Status: DC
  Administered 2019-02-08 – 2019-02-11 (×5): 60 mg via SUBCUTANEOUS
  Filled 2019-02-07 (×6): qty 0.6

## 2019-02-07 MED ORDER — DEXAMETHASONE SODIUM PHOSPHATE 10 MG/ML IJ SOLN
6.0000 mg | INTRAMUSCULAR | Status: DC
  Administered 2019-02-08 – 2019-02-10 (×3): 6 mg via INTRAVENOUS
  Filled 2019-02-07: qty 1
  Filled 2019-02-07: qty 0.6
  Filled 2019-02-07: qty 1

## 2019-02-07 MED ORDER — SODIUM CHLORIDE 0.9 % IV SOLN
200.0000 mg | Freq: Once | INTRAVENOUS | Status: AC
  Administered 2019-02-08: 200 mg via INTRAVENOUS
  Filled 2019-02-07: qty 40

## 2019-02-07 MED ORDER — ZINC SULFATE 220 (50 ZN) MG PO CAPS
220.0000 mg | ORAL_CAPSULE | Freq: Every day | ORAL | Status: DC
  Administered 2019-02-08 – 2019-02-14 (×7): 220 mg via ORAL
  Filled 2019-02-07 (×6): qty 1

## 2019-02-07 MED ORDER — ADULT MULTIVITAMIN W/MINERALS CH
1.0000 | ORAL_TABLET | Freq: Every day | ORAL | Status: DC
  Administered 2019-02-08 – 2019-02-14 (×7): 1 via ORAL
  Filled 2019-02-07 (×8): qty 1

## 2019-02-07 MED ORDER — ACETAMINOPHEN 325 MG PO TABS
650.0000 mg | ORAL_TABLET | Freq: Four times a day (QID) | ORAL | Status: DC | PRN
  Administered 2019-02-08 – 2019-02-12 (×2): 650 mg via ORAL

## 2019-02-07 MED ORDER — INSULIN GLARGINE 100 UNIT/ML ~~LOC~~ SOLN
40.0000 [IU] | SUBCUTANEOUS | Status: AC
  Administered 2019-02-08: 40 [IU] via SUBCUTANEOUS
  Filled 2019-02-07: qty 0.4

## 2019-02-07 MED ORDER — INSULIN ASPART 100 UNIT/ML ~~LOC~~ SOLN
10.0000 [IU] | Freq: Once | SUBCUTANEOUS | Status: AC
  Administered 2019-02-07: 10 [IU] via SUBCUTANEOUS
  Filled 2019-02-07: qty 0.1

## 2019-02-07 MED ORDER — ACETAMINOPHEN 650 MG RE SUPP: 650.0000 mg | Freq: Four times a day (QID) | RECTAL | Status: DC | PRN

## 2019-02-07 MED ORDER — ONDANSETRON HCL 4 MG PO TABS: 4.0000 mg | ORAL_TABLET | Freq: Four times a day (QID) | ORAL | Status: DC | PRN

## 2019-02-07 MED ORDER — INSULIN GLARGINE 100 UNIT/ML ~~LOC~~ SOLN
40.0000 [IU] | Freq: Every morning | SUBCUTANEOUS | Status: DC
  Administered 2019-02-08 – 2019-02-14 (×6): 40 [IU] via SUBCUTANEOUS
  Filled 2019-02-07 (×8): qty 0.4

## 2019-02-07 MED ORDER — INSULIN ASPART 100 UNIT/ML ~~LOC~~ SOLN
0.0000 [IU] | Freq: Three times a day (TID) | SUBCUTANEOUS | Status: DC
  Administered 2019-02-08: 11 [IU] via SUBCUTANEOUS
  Administered 2019-02-08 – 2019-02-09 (×5): 15 [IU] via SUBCUTANEOUS
  Filled 2019-02-07: qty 0.15

## 2019-02-07 MED ORDER — ACETAMINOPHEN 325 MG PO TABS
650.0000 mg | ORAL_TABLET | Freq: Four times a day (QID) | ORAL | Status: DC | PRN
  Administered 2019-02-07: 650 mg via ORAL
  Filled 2019-02-07 (×3): qty 2

## 2019-02-07 MED ORDER — OXYCODONE HCL 5 MG PO TABS
5.0000 mg | ORAL_TABLET | ORAL | Status: DC | PRN
  Administered 2019-02-08 – 2019-02-14 (×2): 5 mg via ORAL
  Filled 2019-02-07 (×2): qty 1

## 2019-02-07 MED ORDER — ONDANSETRON HCL 4 MG/2ML IJ SOLN: 4.0000 mg | Freq: Four times a day (QID) | INTRAMUSCULAR | Status: DC | PRN

## 2019-02-07 NOTE — Progress Notes (Signed)
Lovenox per Pharmacy for DVT Prophylaxis    Pharmacy has been consulted from dosing enoxaparin (lovenox) in this patient for DVT prophylaxis.  The pharmacist has reviewed pertinent labs (Hgb _12.5__; PLT_161__), patient weight (_122__kg) and renal function (CrCl_>90__mL/min) and decided that enoxaparin _60_mg SQ Q24Hrs is appropriate for this patient.  The pharmacy department will sign off at this time.  Please reconsult pharmacy if status changes or for further issues.  Thank you  Cyndia Diver PharmD, BCPS  02/07/2019, 11:08 PM

## 2019-02-07 NOTE — ED Triage Notes (Signed)
Patient BIB EMS from home with complaints of hypoxia. Patient has positive result for Covid. Per home pulse ox, patient was 77% on RA. Patient complaining of SOB, fatigue/malaise, flu-like symptoms. Patient recently hospitalized at [name] for PNA, discharged 02/06/19. Patient has prescription for  Amox, but has not been home long enough to finish it.   EMS VS: 103.104F Temporal - 1000mg  PO Tylenol administered by EMS at 1600. 272mL IV NS administered by EMS en route to 18g Right AC PIV.   EMS VS: 95% 6LNC, HR 110, RR 30, CBG= 443, 130/78.

## 2019-02-07 NOTE — Progress Notes (Signed)
Pharmacy: Remdesivir   Patient is a 33 y.o. male with COVID.  Pharmacy has been consulted for remdesivir dosing.   - ALT: 27 - CXR shows: BILATERAL pulmonary infiltrates consistent with pneumonia and history of COVID, slightly increased in RIGHT upper lobe. - Pt is requiring supplemental oxygen: (yes, 6L )    A/P:  - Patient meets criteria for remdesivir. Will initiate remdesivir 200 mg once followed by 100 mg daily x 4 days.  - Daily CMET while on remdesivir - Will f/u pt's ALT and clinical condition

## 2019-02-07 NOTE — H&P (Signed)
History and Physical    [name] ZOX:[id number] DOB: [birth year] DOA: 02/07/2019  PCP: [name], MD  Patient coming from: Home.  Chief Complaint: Shortness of breath.  HPI: [name] is a 33 y.o. male with history of diabetes mellitus type 2 who was just discharged yesterday after being treated for sepsis with osteomyelitis of the left foot plan was to have antibiotics for 2 weeks and follow-up with podiatry and during last admission patient also was diagnosed with COVID-19 infection but was not having any definite symptoms from it presents to the ER with complaint of increasing shortness of breath since discharge but denies any chest pain has been a nonproductive cough.  Has subjective feeling of fever chills.  Feeling weak and fatigued.  ED Course: In the ER labs revealed D-dimer 1.3 ferritin 398 CRP 9.5 procalcitonin 0.13 lactic acid 1.3 chest x-ray shows worsening infiltrates bilaterally.  Patient had during previous admission refused remdesivir but agrees for now.  And was started on Decadron.  Admitted for acute respiratory failure hypoxia likely from Covid pneumonia.  On exam patient's left foot does not have any active discharge from the ulcer.  Patient blood sugar was around 415 and Lantus insulin was given along with NovoLog.  Review of Systems: As per HPI, rest all negative.   Past Medical History:  Diagnosis Date   Diabetes mellitus     History reviewed. No pertinent surgical history.   reports that he has never smoked. He has never used smokeless tobacco. He reports that he does not drink alcohol or use drugs.  Allergies  Allergen Reactions   Basaglar Kwikpen [Insulin Glargine] Diarrhea   Metformin And Related Diarrhea and Nausea And Vomiting   Trulicity [Dulaglutide] Diarrhea    Family History  Problem Relation Age of Onset   Hypertension Other    Diabetes Other    Healthy Mother     Prior to Admission medications   Medication Sig Start  Date End Date Taking? Authorizing Provider  acetaminophen (TYLENOL) 500 MG tablet Take 500-1,000 mg by mouth every 6 (six) hours as needed for mild pain, fever or headache.     [provider]  amoxicillin-clavulanate (AUGMENTIN) 875-125 MG tablet Take 1 tablet by mouth 2 (two) times daily for 14 days. 02/06/19 02/20/19  Jerald Kief, MD  Ascorbic Acid (VITAMIN C) 1000 MG tablet Take 1 tablet (1,000 mg total) by mouth daily for 7 days. 02/06/19 02/13/19  Jerald Kief, MD  Continuous Blood Gluc Sensor (FREESTYLE LIBRE 14 DAY SENSOR) MISC Inject 1 patch into the skin every 14 (fourteen) days.    [provider]  Continuous Blood Gluc Sensor MISC 1 each by Does not apply route as directed. Use as directed every 14 days. May dispense FreeStyle [employer] or similar. Patient not taking: Reported on 02/03/2019 06/30/17   Darlin Drop, DO  insulin aspart (NOVOLOG) 100 UNIT/ML injection Inject 0-15 Units into the skin 3 (three) times daily with meals. Patient taking differently: Inject 7-12 Units into the skin 3 (three) times daily as needed (before meals, if BGL is 250 or greater).  07/01/17   Lonia Blood, MD  Insulin Glargine (BASAGLAR KWIKPEN) 100 UNIT/ML SOPN Inject 0.24 mLs (24 Units total) into the skin at bedtime. Patient taking differently: Inject 40 Units into the skin every morning.  07/01/17   Lonia Blood, MD  Insulin Glargine (LANTUS SOLOSTAR) 100 UNIT/ML Solostar Pen Inject 40 Units into the skin daily before breakfast.  [provider]  Multiple Vitamins-Minerals (EMERGEN-C IMMUNE) PACK Take 1 packet by mouth daily as needed (for supplementation).    [provider]  multivitamin (ONE-A-DAY MEN'S) TABS tablet Take 1 tablet by mouth daily with breakfast.    [provider]  oxyCODONE (OXY IR/ROXICODONE) 5 MG immediate release tablet Take 1 tablet (5 mg total) by mouth every 4 (four) hours as needed for severe pain. 02/06/19    Jerald Kief, MD  Zinc Sulfate 220 (50 Zn) MG TABS Take 1 tablet (220 mg total) by mouth daily. 02/06/19   Jerald Kief, MD    Physical Exam: Constitutional: Moderately built and nourished. Vitals:   02/07/19 1901 02/07/19 1930 02/07/19 2100 02/07/19 2100  BP:  127/83 132/78 132/78  Pulse: 93  93 95  Resp: (!) 22 (!) 25 (!) 26 (!) 25  Temp:    (!) 102.7 F (39.3 C)  TempSrc:    Oral  SpO2: 92%  93% 92%  Weight:      Height:       Eyes: Anicteric no pallor. ENMT: No discharge from the ears eyes nose or mouth. Neck: No mass felt.  No neck rigidity. Respiratory: No rhonchi or crepitations. Cardiovascular: S1-S2 heard. Abdomen: Soft nontender bowel sounds present. Musculoskeletal: Left foot ulceration with no active discharge. Skin: Left foot ulceration with no active discharge. Neurologic: Alert awake oriented to time place and person.  Moves all extremities. Psychiatric: Appears normal.   Labs on Admission: I have personally reviewed following labs and imaging studies  CBC: Recent Labs  Lab 02/03/19 1412 02/03/19 1618 02/04/19 0220 02/05/19 0340 02/06/19 0354 02/07/19 1848  WBC 3.5*  --  2.3* 2.8* 2.9* 7.3  NEUTROABS 2.5  --   --  2.0 1.9 5.6  HGB 13.4 16.0 11.8* 12.8* 12.0* 12.5*  HCT 41.4 47.0 37.0* 39.7 37.7* 39.6  MCV 84.5  --  85.1 85.0 84.7 86.1  PLT 168  --  130* 134* 134* 161   Basic Metabolic Panel: Recent Labs  Lab 02/03/19 1412 02/03/19 1618 02/04/19 0220 02/05/19 0340 02/06/19 0354 02/07/19 1647  NA 128* 131* 132* 129* 133* 126*  K 5.9* 4.5 4.3 4.3 4.1 4.5  CL 93* 91* 99 95* 98 91*  CO2 25  --  25 24 26 27   GLUCOSE 401* 403* 312* 228* 175* 415*  BUN 15 19 15 10 11 13   CREATININE 1.50* 1.30* 1.02 1.08 1.02 1.13  CALCIUM 8.9  --  8.2* 8.3* 8.1* 7.9*  MG  --   --  1.9 1.9 1.9  --   PHOS  --   --  3.1 2.6 2.5  --    GFR: Estimated Creatinine Clearance: 135.9 mL/min (by C-G formula based on SCr of 1.13 mg/dL). Liver Function  Tests: Recent Labs  Lab 02/03/19 1412 02/04/19 1630 02/05/19 0340 02/06/19 0354 02/07/19 1647  AST 25 26 25 31  46*  ALT 39 28 26 25 27   ALKPHOS 82 65 59 56 59  BILITOT 0.5 0.8 0.7 0.5 0.8  PROT 7.3 6.4* 6.2* 5.8* 6.4*  ALBUMIN 3.4* 2.9* 2.8* 2.5* 2.7*   No results for input(s): LIPASE, AMYLASE in the last 168 hours. No results for input(s): AMMONIA in the last 168 hours. Coagulation Profile: Recent Labs  Lab 02/03/19 1523  INR 1.1   Cardiac Enzymes: No results for input(s): CKTOTAL, CKMB, CKMBINDEX, TROPONINI in the last 168 hours. BNP (last 3 results) No results for input(s): PROBNP in the last 8760 hours. HbA1C:  No results for input(s): HGBA1C in the last 72 hours. CBG: Recent Labs  Lab 02/05/19 1713 02/05/19 2032 02/06/19 0723 02/06/19 1133 02/06/19 1618  GLUCAP 104* 133* 181* 190* 110*   Lipid Profile: No results for input(s): CHOL, HDL, LDLCALC, TRIG, CHOLHDL, LDLDIRECT in the last 72 hours. Thyroid Function Tests: No results for input(s): TSH, T4TOTAL, FREET4, T3FREE, THYROIDAB in the last 72 hours. Anemia Panel: Recent Labs    02/06/19 0354 02/07/19 1647  FERRITIN 357* 487*   Urine analysis:    Component Value Date/Time   COLORURINE YELLOW 02/04/2019 0603   APPEARANCEUR CLEAR 02/04/2019 0603   LABSPEC 1.026 02/04/2019 0603   PHURINE 5.0 02/04/2019 0603   GLUCOSEU >=500 (A) 02/04/2019 0603   HGBUR NEGATIVE 02/04/2019 0603   BILIRUBINUR NEGATIVE 02/04/2019 0603   KETONESUR 5 (A) 02/04/2019 0603   PROTEINUR 100 (A) 02/04/2019 0603   UROBILINOGEN 0.2 11/07/2011 2241   NITRITE NEGATIVE 02/04/2019 0603   LEUKOCYTESUR NEGATIVE 02/04/2019 0603   Sepsis Labs: @LABRCNTIP (procalcitonin:4,lacticidven:4) ) Recent Results (from the past 240 hour(s))  Novel Coronavirus, NAA (Hosp order, Send-out to Ref Lab; TAT 18-24 hrs     Status: Abnormal   Collection Time: 02/03/19  1:01 PM   Specimen: Nasopharyngeal Swab; Respiratory  Result Value Ref Range Status    SARS-CoV-2, NAA DETECTED (A) NOT DETECTED Final    Comment: RESULT CALLED TO, READ BACK BY AND VERIFIED WITH: J. [name], RN AT 1220 ON 02/04/19 BY C. JESSUP, MT. (NOTE)                  Client Requested Flag This nucleic acid amplification test was developed and its performance characteristics determined by [employer]. Nucleic acid amplification tests include PCR and TMA. This test has not been FDA cleared or approved. This test has been authorized by FDA under an Emergency Use Authorization (EUA). This test is only authorized for the duration of time the declaration that circumstances exist justifying the authorization of the emergency use of in vitro diagnostic tests for detection of SARS-CoV-2 virus and/or diagnosis of COVID-19 infection under section 564(b)(1) of the Act, 21 U.S.C. 938BOF-7(P) (1), unless the authorization is terminated or revoked sooner. When diagnostic testing is negative, the possibility of a false negative result should be considered in the context of a patient's recent exposures and the presenc e of clinical signs and symptoms consistent with COVID-19. An individual without symptoms of COVID- 19 and who is not shedding SARS-CoV-2 virus would expect to have a negative (not detected) result in this assay. Performed At: [facility] [address] [place], [place] [id number] Rush Farmer MD OE:[id number]    [place]  Final    Comment: Performed at [facility], [place] [address], [place], [place] [address]  SARS CORONAVIRUS 2 (TAT 6-24 HRS) Nasopharyngeal Nasopharyngeal Swab     Status: Abnormal   Collection Time: 02/03/19  3:22 PM   Specimen: Nasopharyngeal Swab  Result Value Ref Range Status   SARS Coronavirus 2 POSITIVE (A) NEGATIVE Final    Comment: RESULT CALLED TO, READ BACK BY AND VERIFIED WITH: B.LITTLE RN 1959 02/04/2019 MCCORMICK K (NOTE) SARS-CoV-2 target nucleic acids are DETECTED. The SARS-CoV-2  RNA is generally detectable in upper and lower respiratory specimens during the acute phase of infection. Positive results are indicative of active infection with SARS-CoV-2. Clinical  correlation with patient history and other diagnostic information is necessary to determine patient infection status. Positive results do  not rule out bacterial infection  or co-infection with other viruses. The expected result is Negative. Fact Sheet for Patients: HairSlick.no Fact Sheet for Healthcare Providers: quierodirigir.com This test is not yet approved or cleared by the [place] FDA and  has been authorized for detection and/or diagnosis of SARS-CoV-2 by FDA under an Emergency Use Authorization (EUA). This EUA will remain  in effect (meaning this test can be used) f or the duration of the COVID-19 declaration under Section 564(b)(1) of the Act, 21 U.S.C. section 360bbb-3(b)(1), unless the authorization is terminated or revoked sooner. Performed at [facility], [address], [place], [place] [address]   Blood Culture (routine x 2)     Status: None (Preliminary result)   Collection Time: 02/03/19  3:53 PM   Specimen: BLOOD  Result Value Ref Range Status   Specimen Description BLOOD LEFT ANTECUBITAL  Final   Special Requests   Final    BOTTLES DRAWN AEROBIC AND ANAEROBIC Blood Culture adequate volume   Culture   Final    NO GROWTH 4 DAYS Performed at [facility], [address], [place], [place] [address]    Report Status PENDING  Incomplete  Blood Culture (routine x 2)     Status: None (Preliminary result)   Collection Time: 02/03/19  4:11 PM   Specimen: BLOOD  Result Value Ref Range Status   Specimen Description BLOOD RIGHT ANTECUBITAL  Final   Special Requests   Final    BOTTLES DRAWN AEROBIC AND ANAEROBIC Blood Culture adequate volume   Culture   Final    NO GROWTH 4 DAYS Performed at [facility],  [address] [place], [place] [address]    Report Status PENDING  Incomplete  Urine culture     Status: None   Collection Time: 02/04/19  6:03 AM   Specimen: Urine, Random  Result Value Ref Range Status   Specimen Description URINE, RANDOM  Final   Special Requests NONE  Final   Culture   Final    NO GROWTH Performed at [facility], [address] [place], [place] [address]    Report Status 02/05/2019 FINAL  Final  Aerobic Culture (superficial specimen)     Status: None (Preliminary result)   Collection Time: 02/04/19  8:28 AM   Specimen: Foot; Wound  Result Value Ref Range Status   Specimen Description FOOT LEFT  Final   Special Requests SWAB SPEC  Final   Gram Stain   Final    FEW WBC PRESENT,BOTH PMN AND MONONUCLEAR RARE GRAM POSITIVE COCCI IN PAIRS    Culture   Final    FEW SERRATIA FONTICOLA FEW STAPHYLOCOCCUS LUGDUNENSIS SUSCEPTIBILITIES TO FOLLOW Performed at [facility], [address] [place], [place] [address]    Report Status PENDING  Incomplete     Radiological Exams on Admission: Dg Chest Portable 1 View  Result Date: 02/07/2019 CLINICAL DATA:  Hypoxia, COVID-19, shortness of breath, fatigue, lays, hypoxemia EXAM: PORTABLE CHEST 1 VIEW COMPARISON:  Portable exam 1707 hours compared to 02/05/2019 FINDINGS: Borderline enlargement of cardiac silhouette. Stable mediastinal contours. Patchy airspace infiltrates bilaterally consistent with pneumonia and history of COVID, minimally increased in RIGHT upper lobe. No pleural effusion or pneumothorax. Bones unremarkable. IMPRESSION: BILATERAL pulmonary infiltrates consistent with pneumonia and history of COVID, slightly increased in RIGHT upper lobe. Electronically Signed   By: Ulyses Southward M.D.   On: 02/07/2019 17:28    Assessment/Plan Principal Problem:   Acute respiratory failure with hypoxia (  HCC) Active Problems:   Pneumonia due to COVID-19 virus   Uncontrolled type 1 diabetes mellitus with  hyperglycemia (HCC)    1. Acute respiratory failure with hypoxia secondary to Covid pneumonia patient has been placed on Decadron and remdesivir.  If shortness of breath worsens may consider a CT angiogram of the chest. 2. Recent diagnosis of left foot ulceration with osteomyelitis -for now I am going to keep patient on IV antibiotics follow cultures and check procalcitonin again and lactic acid if there is no increase in procalcitonin levels or lactic acid level may change to p.o. Augmentin.  Patient is supposed to follow-up with podiatrist after Covid treatment for definitive treatment of osteomyelitis of the left foot. 3. Diabetes mellitus type 2 with hyperglycemia uncontrolled had given Lantus last night along with NovoLog I have placed patient on moderate dose NovoLog sliding scale due to patient being on Decadron continue home dose of Lantus in the morning.  Given that patient has Covid pneumonia with respiratory failure will need more than 2 midnight stay in inpatient status.   DVT prophylaxis: Lovenox. Code Status: Full code. Family Communication: Discussed with patient. Disposition Plan: Home. Consults called: None. Admission status: Inpatient.   Eduard ClosArshad N Yaeli Hartung MD [facility] Pager [phone].  If 7PM-7AM, please contact night-coverage [email or web address] Password [email or web address]  02/07/2019, 10:35 PM

## 2019-02-07 NOTE — ED Provider Notes (Signed)
[facility] DEPT Provider Note   CSN: [id number] Arrival date & time: 02/07/19  1621     History   Chief Complaint Chief Complaint  Patient presents with  . Covid-19 +  . Shortness of Breath    HPI [name] is a 33 y.o. male.    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  Was incidentally noted to be Covid 19+.  Discharged yesterday and had no respiratory difficulty and saturations were greater than 90% on room air.  Patient states that since he was discharged and throughout the day today he has had significant increased work of his breathing.  States he is continue to have increased coughing.  Nonproductive.  Has had intermittent fevers.     HPI  Past Medical History:  Diagnosis Date  . Diabetes mellitus     Patient Active Problem List   Diagnosis Date Noted  . Pneumonia due to COVID-19 virus 02/07/2019  . Acute respiratory failure with hypoxia ([place]) 02/07/2019  . Uncontrolled type 1 diabetes mellitus with hyperglycemia ([place]) 02/07/2019  . COVID-19 virus infection 02/04/2019  . Sepsis ([place]) 02/03/2019  . Diabetic foot ulcer ([place]) 02/03/2019  . AKI (acute kidney injury) ([place]) 02/03/2019  . CAP (community acquired pneumonia) 06/28/2017  . 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 Hyperglycemia without ketosis 01/19/2013    History reviewed. No pertinent surgical history.      Home Medications    Prior to Admission medications   Medication Sig Start Date End Date Taking? Authorizing Provider  acetaminophen (TYLENOL) 500 MG tablet Take 500-1,000 mg by mouth every 6 (six) hours as needed for mild pain, fever or headache.     [provider]  amoxicillin-clavulanate (AUGMENTIN) 875-125 MG tablet Take 1 tablet by mouth 2 (two) times daily for 14 days. 02/06/19 02/20/19  Donne Hazel, MD  Ascorbic Acid (VITAMIN C) 1000 MG tablet Take 1 tablet (1,000 mg  total) by mouth daily for 7 days. 02/06/19 02/13/19  Donne Hazel, MD  Continuous Blood Gluc Sensor (FREESTYLE LIBRE 14 DAY SENSOR) MISC Inject 1 patch into the skin every 14 (fourteen) days.    [provider]  Continuous Blood Gluc Sensor MISC 1 each by Does not apply route as directed. Use as directed every 14 days. May dispense FreeStyle [employer] or similar. Patient not taking: Reported on 02/03/2019 06/30/17   Kayleen Memos, DO  insulin aspart (NOVOLOG) 100 UNIT/ML injection Inject 0-15 Units into the skin 3 (three) times daily with meals. Patient taking differently: Inject 7-12 Units into the skin 3 (three) times daily as needed (before meals, if BGL is 250 or greater).  07/01/17   Cherene Altes, MD  Insulin Glargine (BASAGLAR KWIKPEN) 100 UNIT/ML SOPN Inject 0.24 mLs (24 Units total) into the skin at bedtime. Patient taking differently: Inject 40 Units into the skin every morning.  07/01/17   Cherene Altes, MD  Insulin Glargine (LANTUS SOLOSTAR) 100 UNIT/ML Solostar Pen Inject 40 Units into the skin daily before breakfast.    [provider]  Multiple Vitamins-Minerals (EMERGEN-C IMMUNE) PACK Take 1 packet by mouth daily as needed (for supplementation).    [provider]  multivitamin (ONE-A-DAY MEN'S) TABS tablet Take 1 tablet by mouth daily with breakfast.    [provider]  oxyCODONE (OXY IR/ROXICODONE) 5 MG immediate release tablet Take 1 tablet (5 mg total) by mouth every 4 (four) hours as  needed for severe pain. 02/06/19   Jerald Kief, MD  Zinc Sulfate 220 (50 Zn) MG TABS Take 1 tablet (220 mg total) by mouth daily. 02/06/19   Jerald Kief, MD    Family History Family History  Problem Relation Age of Onset  . Hypertension Other   . Diabetes Other   . Healthy Mother     Social History Social History   Tobacco Use  . Smoking status: Never Smoker  . Smokeless tobacco: Never Used  Substance Use Topics  . Alcohol  use: No  . Drug use: No     Allergies   Basaglar kwikpen [insulin glargine], Metformin and related, and Trulicity [dulaglutide]   Review of Systems Review of Systems  Constitutional: Positive for chills and fever.  HENT: Negative for ear pain and sore throat.   Eyes: Negative for pain and visual disturbance.  Respiratory: Positive for cough and shortness of breath.   Cardiovascular: Negative for chest pain and palpitations.  Gastrointestinal: Negative for abdominal pain and vomiting.  Genitourinary: Negative for dysuria and hematuria.  Musculoskeletal: Negative for arthralgias and back pain.  Skin: Negative for color change and rash.  Neurological: Negative for seizures and syncope.  All other systems reviewed and are negative.    Physical Exam Updated Vital Signs BP 140/82 (BP Location: Left Arm)   Pulse 95   Temp (!) 100.8 F (38.2 C) (Oral)   Resp (!) 26   Ht 6\' 5"  (1.956 m)   Wt 122.3 kg   SpO2 92%   BMI 31.97 kg/m   Physical Exam Vitals signs and nursing note reviewed.  Constitutional:      Appearance: He is well-developed.  HENT:     Head: Normocephalic and atraumatic.  Eyes:     Conjunctiva/sclera: Conjunctivae normal.  Neck:     Musculoskeletal: Neck supple.  Cardiovascular:     Rate and Rhythm: Normal rate and regular rhythm.     Heart sounds: No murmur.  Pulmonary:     Effort: No accessory muscle usage or respiratory distress.     Comments: 5 L nasal cannula, mild tachypnea, speaking in full sentences, coarse breath sounds bilaterally Abdominal:     Palpations: Abdomen is soft.     Tenderness: There is no abdominal tenderness.  Skin:    General: Skin is warm and dry.  Neurological:     Mental Status: He is alert.      ED Treatments / Results  Labs (all labs ordered are listed, but only abnormal results are displayed) Labs Reviewed  CBC WITH DIFFERENTIAL/PLATELET - Abnormal; Notable for the following components:      Result Value    Hemoglobin 12.5 (*)    Abs Immature Granulocytes 0.08 (*)    All other components within normal limits  COMPREHENSIVE METABOLIC PANEL - Abnormal; Notable for the following components:   Sodium 126 (*)    Chloride 91 (*)    Glucose, Bld 415 (*)    Calcium 7.9 (*)    Total Protein 6.4 (*)    Albumin 2.7 (*)    AST 46 (*)    All other components within normal limits  D-DIMER, QUANTITATIVE (NOT AT [facility]) - Abnormal; Notable for the following components:   D-Dimer, Quant 1.52 (*)    All other components within normal limits  LACTATE DEHYDROGENASE - Abnormal; Notable for the following components:   LDH 468 (*)    All other components within normal limits  FERRITIN - Abnormal; Notable for the  following components:   Ferritin 487 (*)    All other components within normal limits  FIBRINOGEN - Abnormal; Notable for the following components:   Fibrinogen 584 (*)    All other components within normal limits  C-REACTIVE PROTEIN - Abnormal; Notable for the following components:   CRP 9.7 (*)    All other components within normal limits  D-DIMER, QUANTITATIVE (NOT AT [facility]) - Abnormal; Notable for the following components:   D-Dimer, Quant 1.31 (*)    All other components within normal limits  CBG MONITORING, ED - Abnormal; Notable for the following components:   Glucose-Capillary 362 (*)    All other components within normal limits  CULTURE, BLOOD (ROUTINE X 2)  CULTURE, BLOOD (ROUTINE X 2)  LACTIC ACID, PLASMA  LACTIC ACID, PLASMA  PROCALCITONIN  TRIGLYCERIDES  CBC WITH DIFFERENTIAL/PLATELET  CBC  CREATININE, SERUM  C-REACTIVE PROTEIN  COMPREHENSIVE METABOLIC PANEL  FERRITIN  ABO/RH    EKG None  Radiology Dg Chest Portable 1 View  Result Date: 02/07/2019 CLINICAL DATA:  Hypoxia, COVID-19, shortness of breath, fatigue, lays, hypoxemia EXAM: PORTABLE CHEST 1 VIEW COMPARISON:  Portable exam 1707 hours compared to 02/05/2019 FINDINGS: Borderline enlargement of cardiac silhouette.  Stable mediastinal contours. Patchy airspace infiltrates bilaterally consistent with pneumonia and history of COVID, minimally increased in RIGHT upper lobe. No pleural effusion or pneumothorax. Bones unremarkable. IMPRESSION: BILATERAL pulmonary infiltrates consistent with pneumonia and history of COVID, slightly increased in RIGHT upper lobe. Electronically Signed   By: Ulyses SouthwardMark  Boles M.D.   On: 02/07/2019 17:28    Procedures .Critical Care Performed by: Milagros Lollykstra, Devynn Scheff S, MD Authorized by: Milagros Lollykstra, Tranesha Lessner S, MD   Critical care provider statement:    Critical care time (minutes):  45   Critical care was necessary to treat or prevent imminent or life-threatening deterioration of the following conditions:  Respiratory failure   Critical care was time spent personally by me on the following activities:  Discussions with consultants, evaluation of patient's response to treatment, examination of patient, ordering and performing treatments and interventions, ordering and review of laboratory studies, ordering and review of radiographic studies, pulse oximetry, re-evaluation of patient's condition, obtaining history from patient or surrogate and review of old charts   (including critical care time)  Medications Ordered in ED Medications  acetaminophen (TYLENOL) tablet 650 mg (650 mg Oral Given 02/07/19 2113)  oxyCODONE (Oxy IR/ROXICODONE) immediate release tablet 5 mg (has no administration in time range)  zinc sulfate capsule 220 mg (has no administration in time range)  multivitamin with minerals tablet 1 tablet (has no administration in time range)  acetaminophen (TYLENOL) tablet 650 mg (has no administration in time range)    Or  acetaminophen (TYLENOL) suppository 650 mg (has no administration in time range)  ondansetron (ZOFRAN) tablet 4 mg (has no administration in time range)    Or  ondansetron (ZOFRAN) injection 4 mg (has no administration in time range)  insulin aspart (novoLOG)  injection 0-15 Units (has no administration in time range)  insulin glargine (LANTUS) injection 40 Units (has no administration in time range)  enoxaparin (LOVENOX) injection 60 mg (has no administration in time range)  dexamethasone (DECADRON) injection 6 mg (has no administration in time range)  insulin glargine (LANTUS) injection 40 Units (has no administration in time range)  remdesivir 200 mg in sodium chloride 0.9 % 250 mL IVPB (200 mg Intravenous New Bag/Given 02/08/19 0023)    Followed by  remdesivir 100 mg in sodium chloride 0.9 % 250  mL IVPB (has no administration in time range)  dexamethasone (DECADRON) injection 6 mg (6 mg Intravenous Given 02/07/19 2100)  insulin aspart (novoLOG) injection 10 Units (10 Units Subcutaneous Given 02/07/19 2354)     Initial Impression / Assessment and Plan / ED Course  I have reviewed the triage vital signs and the nursing notes.  Pertinent labs & imaging results that were available during my care of the patient were reviewed by me and considered in my medical decision making (see chart for details).  Clinical Course as of Feb 07 25  Thu Feb 07, 2019  1800 Complete initial assessment, patient requiring 5 L nasal cannula to maintain saturation greater 92%, patient though not in respiratory distress, speaking full sentences   [RD]  1932 Reviewed labs, will consult hospitalist for admission   [RD]    Clinical Course User Index [RD] Milagros Loll, MD       33 year old male recent admission for foot infection, now with shortness of breath, hypoxia in setting of recent COVID-19 diagnosis.  Chest x-ray consistent with progressing COVID-19 pneumonia.  He is requiring significant supplemental oxygen but is not in respiratory distress.  Will admit to hospital service for further management.  Hospitalist requests initiate steroids in ER. Placed order for decadron.  Dr. Toniann Fail accepting.  Final Clinical Impressions(s) / ED Diagnoses   Final  diagnoses:  Acute respiratory failure with hypoxia (HCC)  COVID-19  Viral pneumonia    ED Discharge Orders    None       Milagros Loll, MD 02/08/19 0028

## 2019-02-08 ENCOUNTER — Encounter (HOSPITAL_BASED_OUTPATIENT_CLINIC_OR_DEPARTMENT_OTHER): Payer: BC Managed Care – PPO | Admitting: Internal Medicine

## 2019-02-08 DIAGNOSIS — J9601 Acute respiratory failure with hypoxia: Secondary | ICD-10-CM

## 2019-02-08 LAB — CREATININE, SERUM
Creatinine, Ser: 0.89 mg/dL (ref 0.61–1.24)
GFR calc Af Amer: >60 mL/min (ref >60)
GFR calc non Af Amer: >60 mL/min (ref >60)

## 2019-02-08 LAB — CBC
HCT: 42.4 % (ref 39.0–52.0)
Hemoglobin: 13.4 g/dL (ref 13.0–17.0)
MCH: 27.1 pg (ref 26.0–34.0)
MCHC: 31.6 g/dL (ref 30.0–36.0)
MCV: 85.8 fL (ref 80.0–100.0)
Platelets: 168 10*3/uL (ref 150–400)
RBC: 4.94 MIL/uL (ref 4.22–5.81)
RDW: 12.3 % (ref 11.5–15.5)
WBC: 10.4 10*3/uL (ref 4.0–10.5)
nRBC: 0 % (ref 0.0–0.2)

## 2019-02-08 LAB — COMPREHENSIVE METABOLIC PANEL
ALT: 29 U/L (ref 0–44)
AST: 56 U/L — ABNORMAL HIGH (ref 15–41)
Albumin: 2.8 g/dL — ABNORMAL LOW (ref 3.5–5.0)
Alkaline Phosphatase: 62 U/L (ref 38–126)
Anion gap: 10 (ref 5–15)
BUN: 12 mg/dL (ref 6–20)
CO2: 25 mmol/L (ref 22–32)
Calcium: 8.1 mg/dL — ABNORMAL LOW (ref 8.9–10.3)
Chloride: 92 mmol/L — ABNORMAL LOW (ref 98–111)
Creatinine, Ser: 1.08 mg/dL (ref 0.61–1.24)
GFR calc Af Amer: >60 mL/min (ref >60)
GFR calc non Af Amer: >60 mL/min (ref >60)
Glucose, Bld: 384 mg/dL — ABNORMAL HIGH (ref 70–99)
Potassium: 5 mmol/L (ref 3.5–5.1)
Sodium: 127 mmol/L — ABNORMAL LOW (ref 135–145)
Total Bilirubin: 1.1 mg/dL (ref 0.3–1.2)
Total Protein: 6.9 g/dL (ref 6.5–8.1)

## 2019-02-08 LAB — BLOOD GAS, ARTERIAL
Acid-Base Excess: 4 mmol/L — ABNORMAL HIGH (ref 0.0–2.0)
Bicarbonate: 28.8 mmol/L — ABNORMAL HIGH (ref 20.0–28.0)
Drawn by: 235321
O2 Content: 5 L/min
O2 Saturation: 89.7 %
Patient temperature: 100.8
pCO2 arterial: 48.6 mmHg — ABNORMAL HIGH (ref 32.0–48.0)
pH, Arterial: 7.397 (ref 7.350–7.450)
pO2, Arterial: 63.9 mmHg — ABNORMAL LOW (ref 83.0–108.0)

## 2019-02-08 LAB — AEROBIC CULTURE W GRAM STAIN (SUPERFICIAL SPECIMEN)

## 2019-02-08 LAB — CBC WITH DIFFERENTIAL/PLATELET
Abs Immature Granulocytes: 0.15 10*3/uL — ABNORMAL HIGH (ref 0.00–0.07)
Basophils Absolute: 0 10*3/uL (ref 0.0–0.1)
Basophils Relative: 0 %
Eosinophils Absolute: 0.1 10*3/uL (ref 0.0–0.5)
Eosinophils Relative: 1 %
HCT: 42.1 % (ref 39.0–52.0)
Hemoglobin: 13.4 g/dL (ref 13.0–17.0)
Immature Granulocytes: 2 %
Lymphocytes Relative: 8 %
Lymphs Abs: 0.7 10*3/uL (ref 0.7–4.0)
MCH: 27.2 pg (ref 26.0–34.0)
MCHC: 31.8 g/dL (ref 30.0–36.0)
MCV: 85.6 fL (ref 80.0–100.0)
Monocytes Absolute: 0.5 10*3/uL (ref 0.1–1.0)
Monocytes Relative: 5 %
Neutro Abs: 7.6 10*3/uL (ref 1.7–7.7)
Neutrophils Relative %: 84 %
Platelets: 164 10*3/uL (ref 150–400)
RBC: 4.92 MIL/uL (ref 4.22–5.81)
RDW: 12.3 % (ref 11.5–15.5)
WBC: 9.1 10*3/uL (ref 4.0–10.5)
nRBC: 0 % (ref 0.0–0.2)

## 2019-02-08 LAB — PROCALCITONIN: Procalcitonin: 0.48 ng/mL

## 2019-02-08 LAB — CULTURE, BLOOD (ROUTINE X 2)
Culture: NO GROWTH
Culture: NO GROWTH
Special Requests: ADEQUATE
Special Requests: ADEQUATE

## 2019-02-08 LAB — GLUCOSE, CAPILLARY
Glucose-Capillary: 406 mg/dL — ABNORMAL HIGH (ref 70–99)
Glucose-Capillary: 333 mg/dL — ABNORMAL HIGH (ref 70–99)
Glucose-Capillary: 380 mg/dL — ABNORMAL HIGH (ref 70–99)
Glucose-Capillary: 324 mg/dL — ABNORMAL HIGH (ref 70–99)
Glucose-Capillary: 392 mg/dL — ABNORMAL HIGH (ref 70–99)

## 2019-02-08 LAB — C-REACTIVE PROTEIN: CRP: 9.5 mg/dL — ABNORMAL HIGH (ref <1.0)

## 2019-02-08 LAB — LACTIC ACID, PLASMA
Lactic Acid, Venous: 1.3 mmol/L (ref 0.5–1.9)
Lactic Acid, Venous: 1.7 mmol/L (ref 0.5–1.9)

## 2019-02-08 LAB — D-DIMER, QUANTITATIVE: D-Dimer, Quant: 1.31 ug/mL-FEU — ABNORMAL HIGH (ref 0.00–0.50)

## 2019-02-08 LAB — ABO/RH: ABO/RH(D): O POS

## 2019-02-08 LAB — CBG MONITORING, ED: Glucose-Capillary: 362 mg/dL — ABNORMAL HIGH (ref 70–99)

## 2019-02-08 LAB — FERRITIN: Ferritin: 398 ng/mL — ABNORMAL HIGH (ref 24–336)

## 2019-02-08 LAB — MRSA PCR SCREENING: MRSA by PCR: NEGATIVE

## 2019-02-08 MED ORDER — INSULIN ASPART 100 UNIT/ML ~~LOC~~ SOLN
5.0000 [IU] | Freq: Once | SUBCUTANEOUS | Status: AC
  Administered 2019-02-09: 5 [IU] via SUBCUTANEOUS

## 2019-02-08 MED ORDER — SODIUM CHLORIDE 0.9 % IV SOLN
INTRAVENOUS | Status: AC
  Administered 2019-02-08: 07:00:00 via INTRAVENOUS

## 2019-02-08 MED ORDER — VANCOMYCIN HCL 10 G IV SOLR
1500.0000 mg | Freq: Two times a day (BID) | INTRAVENOUS | Status: DC
  Administered 2019-02-08 – 2019-02-09 (×2): 1500 mg via INTRAVENOUS
  Filled 2019-02-08 (×3): qty 1500

## 2019-02-08 MED ORDER — VANCOMYCIN HCL 10 G IV SOLR
2000.0000 mg | Freq: Once | INTRAVENOUS | Status: AC
  Administered 2019-02-08: 2000 mg via INTRAVENOUS
  Filled 2019-02-08: qty 2000

## 2019-02-08 MED ORDER — INSULIN GLARGINE 100 UNIT/ML ~~LOC~~ SOLN: 24.0000 [IU] | Freq: Every day | SUBCUTANEOUS | Status: DC

## 2019-02-08 MED ORDER — PIPERACILLIN-TAZOBACTAM 3.375 G IVPB 30 MIN
3.3750 g | Freq: Once | INTRAVENOUS | Status: AC
  Administered 2019-02-08: 3.375 g via INTRAVENOUS
  Filled 2019-02-08: qty 50

## 2019-02-08 MED ORDER — CHLORHEXIDINE GLUCONATE CLOTH 2 % EX PADS
6.0000 | MEDICATED_PAD | Freq: Every day | CUTANEOUS | Status: DC
  Administered 2019-02-08 – 2019-02-11 (×2): 6 via TOPICAL

## 2019-02-08 MED ORDER — PIPERACILLIN-TAZOBACTAM 3.375 G IVPB
3.3750 g | Freq: Three times a day (TID) | INTRAVENOUS | Status: DC
  Administered 2019-02-08 – 2019-02-09 (×3): 3.375 g via INTRAVENOUS
  Filled 2019-02-08 (×3): qty 50

## 2019-02-08 MED ORDER — LIVING WELL WITH DIABETES BOOK
Freq: Once | Status: AC
  Administered 2019-02-09
  Filled 2019-02-08: qty 1

## 2019-02-08 NOTE — Progress Notes (Signed)
Upon admission to ICU, this patient told the RNs that he could not handle the loud sound of the negative pressure machine. This RN educated on the need for the machine due to his COVID + status. The patient refused to use a bedside commode or urinal, refused to let the RNs complete a CHG bath fully. The patient then stated he would not allow another medication or test to be completed until he was transferred to [facility] or he would leave and go to another hospital. This RN then left the room and spoke to the charge RN. Charge RN contacted facilities, who stated they had to keep the HEPA filter on and could not use the room filter alone. Charge RN then went in to talk to the patient about options for care.

## 2019-02-08 NOTE — Progress Notes (Signed)
Around 1230pm patient ambulated to bathroom, however, patient disconnected pulse ox. Upon returning to bed, patient was connected back to pulse ox and noticed that he was satting 69% on 5L. RN increased oxygen to 10L and instructed to take deep breaths, in addition to sitting up. Shortly after, patient returned to baseline and oxygen decreased back to 5L. Patient educated on the need to keep pulse ox on, especially when ambulating.

## 2019-02-08 NOTE — ED Notes (Signed)
Called Rt for art gas

## 2019-02-08 NOTE — Progress Notes (Signed)
PROGRESS NOTE    [name]  WUJ:[id number] DOB: January 22, 1986 DOA: 02/07/2019 PCP: [name], MD  Outpatient Specialists:   Brief Narrative:  Patient is a 33 year old male with past medical history significant for diabetes mellitus type 2.  Patient was readmitted after 1 day of discharge from the hospital.  Apparently, patient was in the hospital for sepsis and osteomyelitis of the left foot.  During intermediate past inpatient stay, patient was found to have COVID-19 infection.  Patient presented with worsening shortness of breath following discharge from the hospital, nonproductive cough, fever and chills, weakness and fatigue.  Patient has been readmitted for management of COVID-19 infection.  The plan is to transfer patient to [facility] when a bed is available.  Further management depend on hospital course.  Assessment & Plan:   Principal Problem:   Acute respiratory failure with hypoxia (HCC) Active Problems:   Pneumonia due to COVID-19 virus   Uncontrolled type 1 diabetes mellitus with hyperglycemia (HCC)  Acute respiratory failure with hypoxia secondary to Covid pneumonia: -Continue Decadron and remdesivir.   -Continue zinc.  Add vitamin C. -Supportive care. -Check inflammatory markers daily. -Transfer patient to [facility] when a bed is available -Further management will depend on hospital course.  Recent diagnosis of left foot ulceration with osteomyelitis: -Continue IV Zosyn and vancomycin.   -Complete course of antibiotics.    Diabetes mellitus type 2 with hyperglycemia uncontrolled: -Continue subcutaneous Lantus and sliding scale insulin coverage.  -Monitor closely while patient is on steroids.  -Further management depend on hospital course.   DVT prophylaxis: Subcutaneous Lovenox Code Status: Full code Family Communication:  Disposition Plan: Transfer patient to [facility] when a bed is available    Consultants:   None  Procedures:   None  Antimicrobials:   IV Zosyn  IV vancomycin  IV remdesivir   Subjective: Patient continues to have fever and chills. Shortness of breath is improving.  Objective: Vitals:   02/08/19 0500 02/08/19 0517 02/08/19 0600 02/08/19 0800  BP:  137/77 (!) 145/79 139/77  Pulse:  89 90 89  Resp:  (!) 22 (!) 23   Temp:  99.9 F (37.7 C)  98.3 F (36.8 C)  TempSrc:  Oral  Oral  SpO2:  (!) 89% 91% (!) 87%  Weight: 119.5 kg     Height:  (1.956 m)       Intake/Output Summary (Last 24 hours) at 02/08/2019 1026 Last data filed at 02/08/2019 0800 Gross per 24 hour  Intake 672.72 ml  Output -  Net 672.72 ml   Filed Weights   02/07/19 1646 02/08/19 0500  Weight: 122.3 kg 119.5 kg    Examination:  General exam: Appears calm and comfortable  Respiratory system: Decreased air entry. Cardiovascular system: S1 & S2 heard Gastrointestinal system: Abdomen is nondistended, soft and nontender. No organomegaly or masses felt. Normal bowel sounds heard. Central nervous system: Alert and oriented.  Patient moves all extremities.   Extremities: Left foot is dressed.  Data Reviewed: I have personally reviewed following labs and imaging studies  CBC: Recent Labs  Lab 02/03/19 1412  02/05/19 0340 02/06/19 0354 02/07/19 1848 02/07/19 2350 02/08/19 0612  WBC 3.5*   < > 2.8* 2.9* 7.3 9.1 10.4  NEUTROABS 2.5  --  2.0 1.9 5.6 7.6  --   HGB 13.4   < > 12.8* 12.0* 12.5* 13.4 13.4  HCT 41.4   < > 39.7 37.7* 39.6 42.1  42.4  MCV 84.5   < > 85.0 84.7 86.1 85.6 85.8  PLT 168   < > 134* 134* 161 164 168   < > = values in this interval not displayed.   Basic Metabolic Panel: Recent Labs  Lab 02/04/19 0220 02/05/19 0340 02/06/19 0354 02/07/19 1647 02/07/19 2350 02/08/19 0612  NA 132* 129* 133* 126* 127*  --   K 4.3 4.3 4.1 4.5 5.0  --   CL 99 95* 98 91* 92*  --   CO2 25 24 26 27 25   --   GLUCOSE 312* 228* 175* 415* 384*  --   BUN 15  10 11 13 12   --   CREATININE 1.02 1.08 1.02 1.13 1.08 0.89  CALCIUM 8.2* 8.3* 8.1* 7.9* 8.1*  --   MG 1.9 1.9 1.9  --   --   --   PHOS 3.1 2.6 2.5  --   --   --    GFR: Estimated Creatinine Clearance: 170.7 mL/min (by C-G formula based on SCr of 0.89 mg/dL). Liver Function Tests: Recent Labs  Lab 02/04/19 1630 02/05/19 0340 02/06/19 0354 02/07/19 1647 02/07/19 2350  AST 26 25 31  46* 56*  ALT 28 26 25 27 29   ALKPHOS 65 59 56 59 62  BILITOT 0.8 0.7 0.5 0.8 1.1  PROT 6.4* 6.2* 5.8* 6.4* 6.9  ALBUMIN 2.9* 2.8* 2.5* 2.7* 2.8*   No results for input(s): LIPASE, AMYLASE in the last 168 hours. No results for input(s): AMMONIA in the last 168 hours. Coagulation Profile: Recent Labs  Lab 02/03/19 1523  INR 1.1   Cardiac Enzymes: No results for input(s): CKTOTAL, CKMB, CKMBINDEX, TROPONINI in the last 168 hours. BNP (last 3 results) No results for input(s): PROBNP in the last 8760 hours. HbA1C: No results for input(s): HGBA1C in the last 72 hours. CBG: Recent Labs  Lab 02/06/19 1618 02/07/19 2349 02/08/19 0300 02/08/19 0836 02/08/19 1007  GLUCAP 110* 362* 362* 406* 333*   Lipid Profile: Recent Labs    02/07/19 1647  TRIG 118   Thyroid Function Tests: No results for input(s): TSH, T4TOTAL, FREET4, T3FREE, THYROIDAB in the last 72 hours. Anemia Panel: Recent Labs    02/07/19 1647 02/07/19 2350  FERRITIN 487* 398*   Urine analysis:    Component Value Date/Time   COLORURINE YELLOW 02/04/2019 0603   APPEARANCEUR CLEAR 02/04/2019 0603   LABSPEC 1.026 02/04/2019 0603   PHURINE 5.0 02/04/2019 0603   GLUCOSEU >=500 (A) 02/04/2019 0603   HGBUR NEGATIVE 02/04/2019 0603   BILIRUBINUR NEGATIVE 02/04/2019 0603   KETONESUR 5 (A) 02/04/2019 0603   PROTEINUR 100 (A) 02/04/2019 0603   UROBILINOGEN 0.2 11/07/2011 2241   NITRITE NEGATIVE 02/04/2019 0603   LEUKOCYTESUR NEGATIVE 02/04/2019 0603   Sepsis Labs: @LABRCNTIP (procalcitonin:4,lacticidven:4)  ) Recent Results  (from the past 240 hour(s))  Novel Coronavirus, NAA (Hosp order, Send-out to Ref Lab; TAT 18-24 hrs     Status: Abnormal   Collection Time: 02/03/19  1:01 PM   Specimen: Nasopharyngeal Swab; Respiratory  Result Value Ref Range Status   SARS-CoV-2, NAA DETECTED (A) NOT DETECTED Final    Comment: RESULT CALLED TO, READ BACK BY AND VERIFIED WITH: J. [name], RN AT 1220 ON 02/04/19 BY C. JESSUP, MT. (NOTE)                  Client Requested Flag This nucleic acid amplification test was developed and its performance characteristics determined by 13/05/2018. Nucleic acid amplification tests include PCR  and TMA. This test has not been FDA cleared or approved. This test has been authorized by FDA under an Emergency Use Authorization (EUA). This test is only authorized for the duration of time the declaration that circumstances exist justifying the authorization of the emergency use of in vitro diagnostic tests for detection of SARS-CoV-2 virus and/or diagnosis of COVID-19 infection under section 564(b)(1) of the Act, 21 U.S.C. 536RWE-3(X) (1), unless the authorization is terminated or revoked sooner. When diagnostic testing is negative, the possibility of a false negative result should be considered in the context of a patient's recent exposures and the presenc e of clinical signs and symptoms consistent with COVID-19. An individual without symptoms of COVID- 19 and who is not shedding SARS-CoV-2 virus would expect to have a negative (not detected) result in this assay. Performed At: [facility] [address] [place], [place] [id number] Rush Farmer MD PJ:[id number]    [place]  Final    Comment: Performed at [facility], [place] [address] [place], [place] [address]  SARS CORONAVIRUS 2 (TAT 6-24 HRS) Nasopharyngeal Nasopharyngeal Swab     Status: Abnormal   Collection Time: 02/03/19  3:22 PM   Specimen: Nasopharyngeal Swab  Result Value Ref  Range Status   SARS Coronavirus 2 POSITIVE (A) NEGATIVE Final    Comment: RESULT CALLED TO, READ BACK BY AND VERIFIED WITH: B.LITTLE RN 1959 02/04/2019 MCCORMICK K (NOTE) SARS-CoV-2 target nucleic acids are DETECTED. The SARS-CoV-2 RNA is generally detectable in upper and lower respiratory specimens during the acute phase of infection. Positive results are indicative of active infection with SARS-CoV-2. Clinical  correlation with patient history and other diagnostic information is necessary to determine patient infection status. Positive results do  not rule out bacterial infection or co-infection with other viruses. The expected result is Negative. Fact Sheet for Patients: SugarRoll.be Fact Sheet for Healthcare Providers: https://www.woods-mathews.com/ This test is not yet approved or cleared by the [place] FDA and  has been authorized for detection and/or diagnosis of SARS-CoV-2 by FDA under an Emergency Use Authorization (EUA). This EUA will remain  in effect (meaning this test can be used) f or the duration of the COVID-19 declaration under Section 564(b)(1) of the Act, 21 U.S.C. section 360bbb-3(b)(1), unless the authorization is terminated or revoked sooner. Performed at [facility], [place] [address] [place], [place] [address]   Blood Culture (routine x 2)     Status: None (Preliminary result)   Collection Time: 02/03/19  3:53 PM   Specimen: BLOOD  Result Value Ref Range Status   Specimen Description BLOOD LEFT ANTECUBITAL  Final   Special Requests   Final    BOTTLES DRAWN AEROBIC AND ANAEROBIC Blood Culture adequate volume   Culture   Final    NO GROWTH 4 DAYS Performed at [facility], [place] [address] [place], [place] [address]    Report Status PENDING  Incomplete  Blood Culture (routine x 2)     Status: None (Preliminary result)   Collection Time: 02/03/19  4:11 PM   Specimen: BLOOD  Result Value Ref Range  Status   Specimen Description BLOOD RIGHT ANTECUBITAL  Final   Special Requests   Final    BOTTLES DRAWN AEROBIC AND ANAEROBIC Blood Culture adequate volume   Culture   Final    NO GROWTH 4 DAYS Performed at [facility], [place] [address] [place], [place] [address]    Report Status PENDING  Incomplete  Urine culture     Status: None   Collection Time: 02/04/19  6:03 AM   Specimen: Urine, Random  Result Value Ref Range Status   Specimen Description URINE, RANDOM  Final   Special Requests NONE  Final   Culture   Final    NO GROWTH Performed at [facility], [address] [place], [place] [address]    Report Status 02/05/2019 FINAL  Final  Aerobic Culture (superficial specimen)     Status: None (Preliminary result)   Collection Time: 02/04/19  8:28 AM   Specimen: Foot; Wound  Result Value Ref Range Status   Specimen Description FOOT LEFT  Final   Special Requests SWAB SPEC  Final   Gram Stain   Final    FEW WBC PRESENT,BOTH PMN AND MONONUCLEAR RARE GRAM POSITIVE COCCI IN PAIRS Performed at [facility], [address] [place], [place] [address]    Culture   Final    FEW SERRATIA FONTICOLA FEW STAPHYLOCOCCUS LUGDUNENSIS    Report Status PENDING  Incomplete  MRSA PCR Screening     Status: None   Collection Time: 02/08/19  6:12 AM   Specimen: Nasal Mucosa; Nasopharyngeal  Result Value Ref Range Status   MRSA by PCR NEGATIVE NEGATIVE Final    Comment:        The GeneXpert MRSA Assay (FDA approved for NASAL specimens only), is one component of a comprehensive MRSA colonization surveillance program. It is not intended to diagnose MRSA infection nor to guide or monitor treatment for MRSA infections. Performed at [facility], [address] [place], [place] [address]          Radiology Studies: Dg Chest Portable 1 View  Result Date: 02/07/2019 CLINICAL DATA:  Hypoxia, COVID-19, shortness of breath, fatigue, lays, hypoxemia  EXAM: PORTABLE CHEST 1 VIEW COMPARISON:  Portable exam 1707 hours compared to 02/05/2019 FINDINGS: Borderline enlargement of cardiac silhouette. Stable mediastinal contours. Patchy airspace infiltrates bilaterally consistent with pneumonia and history of COVID, minimally increased in RIGHT upper lobe. No pleural effusion or pneumothorax. Bones unremarkable. IMPRESSION: BILATERAL pulmonary infiltrates consistent with pneumonia and history of COVID, slightly increased in RIGHT upper lobe. Electronically Signed   By: Ulyses SouthwardMark  Boles M.D.   On: 02/07/2019 17:28        Scheduled Meds: . Chlorhexidine Gluconate Cloth  6 each Topical Daily  . dexamethasone (DECADRON) injection  6 mg Intravenous Q24H  . enoxaparin (LOVENOX) injection  60 mg Subcutaneous QHS  . insulin aspart  0-15 Units Subcutaneous TID WC  . insulin glargine  24 Units Subcutaneous QHS  . insulin glargine  40 Units Subcutaneous q morning - 10a  . multivitamin with minerals  1 tablet Oral Q breakfast  . zinc sulfate  220 mg Oral Daily   Continuous Infusions: . sodium chloride Stopped (02/08/19 0754)  . piperacillin-tazobactam (ZOSYN)  IV    . remdesivir 100 mg in NS 250 mL    . vancomycin       LOS: 1 day    Time spent: 35 minutes    Berton MountSylvester Hurley Blevins, MD  [facility] Pager #: [phone] 7PM-7AM contact night coverage as above

## 2019-02-08 NOTE — Consult Note (Signed)
[facility] Nurse wound consult note  Reason for Consult: Patient with left foot, 5th metatarsal head full thickness ulceration.  Seen by podiatry in the community.  Last seen by Dr. Marilynn Rail on 02/05/19. Wound type:Neuropathic, infectious Pressure Injury POA: N/A Measurement:3cm x 3cm x 0.3cm Wound bed: 80% red, 20% white Drainage (amount, consistency, odor) small to moderate amount serous Periwound:intact with callous, see photo taken on 02/03/19 by Dr. Sloan Leiter. Dressing procedure/placement/frequency: Per Dr. Moise Boring note on 11/5, patient with osteomyelitis in the left foot 5th digit and is to follow up with podiatry again after Covid resolves.  In the meantime, I will provide Nursing with guidance for topical care using twice-daily NS dressings.  [facility] nursing team will not follow, but will remain available to this patient, the nursing and medical teams.  Please re-consult if needed. Thanks, Maudie Flakes, MSN, RN, [place], Arther Abbott  Pager# [phone]

## 2019-02-08 NOTE — ED Notes (Signed)
ED TO INPATIENT HANDOFF REPORT  ED Nurse Name and Phone #: jon wled   S Name/Age/Gender [name] 33 y.o. male Room/Bed: WA12/WA12  Code Status   Code Status: Full Code  Home/SNF/Other Home Patient oriented to: self, place, time and situation Is this baseline? Yes   Triage Complete: Triage complete  Chief Complaint covid +  Triage Note Patient BIB EMS from home with complaints of hypoxia. Patient has positive result for Covid. Per home pulse ox, patient was 77% on RA. Patient complaining of SOB, fatigue/malaise, flu-like symptoms. [facility], discharged 02/06/19. Patient has prescription for  Amox, but has not been home long enough to finish it.   EMS VS: 103.32F Temporal - 1000mg  PO Tylenol administered by EMS at 1600. 250mL IV NS administered by EMS en route to 18g Right AC PIV.   EMS VS: 95% 6LNC, HR 110, RR 30, CBG= 443, 130/78.   Allergies Allergies  Allergen Reactions  . Basaglar Stephanie CoupKwikpen [Insulin Glargine] Diarrhea  . Metformin And Related Diarrhea and Nausea And Vomiting  . Trulicity [Dulaglutide] Diarrhea    Level of Care/Admitting Diagnosis ED Disposition    ED Disposition Condition Comment   Admit  Hospital Area: [facility] [[id number]]  Level of Care: Stepdown [14]  Admit to SDU based on following criteria: Severe physiological/psychological symptoms:  Any diagnosis requiring assessment & intervention at least every 4 hours on an ongoing basis to obtain desired patient outcomes including stability and rehabilitation  Covid Evaluation: Confirmed COVID Positive  Diagnosis: Acute respiratory failure with hypoxia [facility](HCC) [[id number]]) [[id number]]  Admitting Physician: Eduard ClosKAKRAKANDY, ARSHAD N [phone][[id number]]  Attending Physician: Eduard ClosKAKRAKANDY, ARSHAD N [phone][[id number]]  Estimated length of stay: past midnight tomorrow  Certification:: I certify this patient will need inpatient services for at least 2 midnights  PT Class (Do Not Modify): Inpatient  [101]  PT Acc Code (Do Not Modify): Private [1]       B Medical/Surgery History Past Medical History:  Diagnosis Date  . Diabetes mellitus    History reviewed. No pertinent surgical history.   A IV Location/Drains/Wounds Patient Lines/Drains/Airways Status   Active Line/Drains/Airways    Name:   Placement date:   Placement time:   Site:   Days:   Peripheral IV 02/07/19 Right Antecubital   02/07/19    -    Antecubital   1   Peripheral IV 02/07/19 Left Hand   02/07/19    2345    Hand   1   Wound / Incision (Open or Dehisced) 02/05/19 Diabetic ulcer Foot Left   02/05/19    1904    Foot   3          Intake/Output Last 24 hours No intake or output data in the 24 hours ending 02/08/19 0406  Labs/Imaging Results for orders placed or performed during the hospital encounter of 02/07/19 (from the past 48 hour(s))  Comprehensive metabolic panel     Status: Abnormal   Collection Time: 02/07/19  4:47 PM  Result Value Ref Range   Sodium 126 (L) 135 - 145 mmol/L   Potassium 4.5 3.5 - 5.1 mmol/L   Chloride 91 (L) 98 - 111 mmol/L   CO2 27 22 - 32 mmol/L   Glucose, Bld 415 (H) 70 - 99 mg/dL   BUN 13 6 - 20 mg/dL   Creatinine, Ser 7.251.13 0.61 - 1.24 mg/dL   Calcium 7.9 (L) 8.9 - 10.3 mg/dL   Total Protein 6.4 (L) 6.5 -  8.1 g/dL   Albumin 2.7 (L) 3.5 - 5.0 g/dL   AST 46 (H) 15 - 41 U/L   ALT 27 0 - 44 U/L   Alkaline Phosphatase 59 38 - 126 U/L   Total Bilirubin 0.8 0.3 - 1.2 mg/dL   GFR calc non Af Amer >60 >60 mL/min   GFR calc Af Amer >60 >60 mL/min   Anion gap 8 5 - 15    Comment: Performed at [facility], [address], [place], [place] [address]  D-dimer, quantitative     Status: Abnormal   Collection Time: 02/07/19  4:47 PM  Result Value Ref Range   D-Dimer, Quant 1.52 (H) 0.00 - 0.50 ug/mL-FEU    Comment: (NOTE) At the manufacturer cut-off of 0.50 ug/mL FEU, this assay has been documented to exclude PE with a sensitivity and negative predictive value  of 97 to 99%.  At this time, this assay has not been approved by the FDA to exclude DVT/VTE. Results should be correlated with clinical presentation. Performed at [facility], [address] [place], [place] [address]   Procalcitonin     Status: None   Collection Time: 02/07/19  4:47 PM  Result Value Ref Range   Procalcitonin 0.13 ng/mL    Comment:        Interpretation: PCT (Procalcitonin) <= 0.5 ng/mL: Systemic infection (sepsis) is not likely. Local bacterial infection is possible. (NOTE)       Sepsis PCT Algorithm           Lower Respiratory Tract                                      Infection PCT Algorithm    ----------------------------     ----------------------------         PCT < 0.25 ng/mL                PCT < 0.10 ng/mL         Strongly encourage             Strongly discourage   discontinuation of antibiotics    initiation of antibiotics    ----------------------------     -----------------------------       PCT 0.25 - 0.50 ng/mL            PCT 0.10 - 0.25 ng/mL               OR       >80% decrease in PCT            Discourage initiation of                                            antibiotics      Encourage discontinuation           of antibiotics    ----------------------------     -----------------------------         PCT >= 0.50 ng/mL              PCT 0.26 - 0.50 ng/mL               AND        <80% decrease in PCT             Encourage  initiation of                                             antibiotics       Encourage continuation           of antibiotics    ----------------------------     -----------------------------        PCT >= 0.50 ng/mL                  PCT > 0.50 ng/mL               AND         increase in PCT                  Strongly encourage                                      initiation of antibiotics    Strongly encourage escalation           of antibiotics                                      -----------------------------                                           PCT <= 0.25 ng/mL                                                 OR                                        > 80% decrease in PCT                                     Discontinue / Do not initiate                                             antibiotics Performed at [facility] [address] [place], [place] [address]   Lactate dehydrogenase     Status: Abnormal   Collection Time: 02/07/19  4:47 PM  Result Value Ref Range   LDH 468 (H) 98 - 192 U/L    Comment: Performed at [facility], [place] [address] [place], [place] [address]  Ferritin     Status: Abnormal   Collection Time: 02/07/19  4:47 PM  Result Value Ref Range   Ferritin 487 (H) 24 - 336 ng/mL    Comment: Performed at [facility], [place] [address] [place], [place] [address]  Triglycerides     Status: None   Collection Time: 02/07/19  4:47 PM  Result Value Ref Range   Triglycerides 118 <150 mg/dL    Comment: Performed at [facility], [address] [place], [place] [address]  Fibrinogen     Status: Abnormal   Collection Time: 02/07/19  4:47 PM  Result Value Ref Range   Fibrinogen 584 (H) 210 - 475 mg/dL    Comment: Performed at [facility], [address] [place], [place] [address]  C-reactive protein     Status: Abnormal   Collection Time: 02/07/19  4:47 PM  Result Value Ref Range   CRP 9.7 (H) <1.0 mg/dL    Comment: Performed at [facility], [address], [place], [place] [address]  CBC with Differential     Status: Abnormal   Collection Time: 02/07/19  6:48 PM  Result Value Ref Range   WBC 7.3 4.0 - 10.5 K/uL   RBC 4.60 4.22 - 5.81 MIL/uL   Hemoglobin 12.5 (L) 13.0 - 17.0 g/dL   HCT 20.1 00.7 - 12.1 %   MCV 86.1 80.0 - 100.0 fL   MCH 27.2 26.0 - 34.0 pg   MCHC 31.6 30.0 - 36.0 g/dL   RDW 97.5 88.3 - 25.4 %   Platelets 161 150 - 400  K/uL   nRBC 0.0 0.0 - 0.2 %   Neutrophils Relative % 76 %   Neutro Abs 5.6 1.7 - 7.7 K/uL   Lymphocytes Relative 12 %   Lymphs Abs 0.8 0.7 - 4.0 K/uL   Monocytes Relative 8 %   Monocytes Absolute 0.6 0.1 - 1.0 K/uL   Eosinophils Relative 3 %   Eosinophils Absolute 0.2 0.0 - 0.5 K/uL   Basophils Relative 0 %   Basophils Absolute 0.0 0.0 - 0.1 K/uL   Immature Granulocytes 1 %   Abs Immature Granulocytes 0.08 (H) 0.00 - 0.07 K/uL   Giant PLTs PRESENT     Comment: Performed at [facility], [address], [place], [place] [address]  Lactic acid, plasma     Status: None   Collection Time: 02/07/19  6:50 PM  Result Value Ref Range   Lactic Acid, Venous 1.3 0.5 - 1.9 mmol/L    Comment: Performed at [facility], [address] [place], [place] [address]  Lactic acid, plasma     Status: None   Collection Time: 02/07/19  9:00 PM  Result Value Ref Range   Lactic Acid, Venous 1.4 0.5 - 1.9 mmol/L    Comment: Performed at [facility], [address], [place], [place] [address]  CBG monitoring, ED     Status: Abnormal   Collection Time: 02/07/19 11:49 PM  Result Value Ref Range   Glucose-Capillary 362 (H) 70 - 99 mg/dL  ABO/Rh     Status: None   Collection Time: 02/07/19 11:50 PM  Result Value Ref Range   ABO/RH(D)      O POS Performed at [facility], [address] [place], [place] [address]   C-reactive protein     Status: Abnormal   Collection Time: 02/07/19 11:50 PM  Result Value Ref Range   CRP 9.5 (H) <1.0 mg/dL    Comment: Performed at [facility], [address], [place], [place] [address]  Comprehensive metabolic panel     Status: Abnormal   Collection Time: 02/07/19 11:50 PM  Result Value Ref Range   Sodium 127 (L) 135 - 145 mmol/L   Potassium 5.0 3.5 - 5.1 mmol/L  Chloride 92 (L) 98 - 111 mmol/L   CO2 25 22 - 32 mmol/L   Glucose, Bld 384 (H) 70 - 99 mg/dL   BUN 12 6 -  20 mg/dL   Creatinine, Ser 1.61 0.61 - 1.24 mg/dL   Calcium 8.1 (L) 8.9 - 10.3 mg/dL   Total Protein 6.9 6.5 - 8.1 g/dL   Albumin 2.8 (L) 3.5 - 5.0 g/dL   AST 56 (H) 15 - 41 U/L   ALT 29 0 - 44 U/L   Alkaline Phosphatase 62 38 - 126 U/L   Total Bilirubin 1.1 0.3 - 1.2 mg/dL   GFR calc non Af Amer >60 >60 mL/min   GFR calc Af Amer >60 >60 mL/min   Anion gap 10 5 - 15    Comment: Performed at [facility], [address], [place], [place] [address]  CBC with Differential/Platelet     Status: Abnormal   Collection Time: 02/07/19 11:50 PM  Result Value Ref Range   WBC 9.1 4.0 - 10.5 K/uL   RBC 4.92 4.22 - 5.81 MIL/uL   Hemoglobin 13.4 13.0 - 17.0 g/dL   HCT 54.0 98.1 - 19.1 %   MCV 85.6 80.0 - 100.0 fL   MCH 27.2 26.0 - 34.0 pg   MCHC 31.8 30.0 - 36.0 g/dL   RDW 47.8 29.5 - 62.1 %   Platelets 164 150 - 400 K/uL   nRBC 0.0 0.0 - 0.2 %   Neutrophils Relative % 84 %   Neutro Abs 7.6 1.7 - 7.7 K/uL   Lymphocytes Relative 8 %   Lymphs Abs 0.7 0.7 - 4.0 K/uL   Monocytes Relative 5 %   Monocytes Absolute 0.5 0.1 - 1.0 K/uL   Eosinophils Relative 1 %   Eosinophils Absolute 0.1 0.0 - 0.5 K/uL   Basophils Relative 0 %   Basophils Absolute 0.0 0.0 - 0.1 K/uL   Immature Granulocytes 2 %   Abs Immature Granulocytes 0.15 (H) 0.00 - 0.07 K/uL   Reactive, Benign Lymphocytes PRESENT     Comment: Performed at [facility], [address] [place], [place] [address]  D-dimer, quantitative (not at [facility])     Status: Abnormal   Collection Time: 02/07/19 11:50 PM  Result Value Ref Range   D-Dimer, Quant 1.31 (H) 0.00 - 0.50 ug/mL-FEU    Comment: (NOTE) At the manufacturer cut-off of 0.50 ug/mL FEU, this assay has been documented to exclude PE with a sensitivity and negative predictive value of 97 to 99%.  At this time, this assay has not been approved by the FDA to exclude DVT/VTE. Results should be correlated with clinical presentation. Performed at  [facility], [address], [place], [place] [address]   Ferritin     Status: Abnormal   Collection Time: 02/07/19 11:50 PM  Result Value Ref Range   Ferritin 398 (H) 24 - 336 ng/mL    Comment: Performed at [facility], [address] [place], [place] [address]  CBG monitoring, ED     Status: Abnormal   Collection Time: 02/08/19  3:00 AM  Result Value Ref Range   Glucose-Capillary 362 (H) 70 - 99 mg/dL   Dg Chest Portable 1 View  Result Date: 02/07/2019 CLINICAL DATA:  Hypoxia, COVID-19, shortness of breath, fatigue, lays, hypoxemia EXAM: PORTABLE CHEST 1 VIEW COMPARISON:  Portable exam 1707 hours compared to 02/05/2019 FINDINGS: Borderline enlargement of cardiac silhouette. Stable mediastinal contours. Patchy airspace infiltrates bilaterally consistent  with pneumonia and history of COVID, minimally increased in RIGHT upper lobe. No pleural effusion or pneumothorax. Bones unremarkable. IMPRESSION: BILATERAL pulmonary infiltrates consistent with pneumonia and history of COVID, slightly increased in RIGHT upper lobe. Electronically Signed   By: Ulyses Southward M.D.   On: 02/07/2019 17:28    Pending Labs Unresulted Labs (From admission, onward)    Start     Ordered   02/14/19 0500  Creatinine, serum  (enoxaparin (LOVENOX)    CrCl >/= 30 ml/min)  Weekly,   R    Comments: while on enoxaparin therapy    02/07/19 2235   02/08/19 0336  Blood gas, arterial  ONCE - STAT,   R     02/08/19 0335   02/07/19 2235  CBC  (enoxaparin (LOVENOX)    CrCl >/= 30 ml/min)  Once,   STAT    Comments: Baseline for enoxaparin therapy IF NOT ALREADY DRAWN.  Notify MD if PLT < 100 K.    02/07/19 2235   02/07/19 2235  Creatinine, serum  (enoxaparin (LOVENOX)    CrCl >/= 30 ml/min)  Once,   STAT    Comments: Baseline for enoxaparin therapy IF NOT ALREADY DRAWN.    02/07/19 2235   02/07/19 1647  Blood Culture (routine x 2)  BLOOD CULTURE X 2,   STAT     02/07/19 1647           Vitals/Pain Today's Vitals   02/08/19 0030 02/08/19 0100 02/08/19 0324 02/08/19 0326  BP: (!) 147/92 (!) 145/86 (!) 159/90   Pulse: 94  92   Resp: (!) 25 (!) 24 (!) 25   Temp: (!) 100.7 F (38.2 C)     TempSrc: Oral  Oral   SpO2: 93%  93% 92%  Weight:      Height:      PainSc: 0-No pain  0-No pain     Isolation Precautions Airborne and Contact precautions  Medications Medications  acetaminophen (TYLENOL) tablet 650 mg (650 mg Oral Given 02/07/19 2113)  oxyCODONE (Oxy IR/ROXICODONE) immediate release tablet 5 mg (has no administration in time range)  zinc sulfate capsule 220 mg (has no administration in time range)  multivitamin with minerals tablet 1 tablet (has no administration in time range)  acetaminophen (TYLENOL) tablet 650 mg (has no administration in time range)    Or  acetaminophen (TYLENOL) suppository 650 mg (has no administration in time range)  ondansetron (ZOFRAN) tablet 4 mg (has no administration in time range)    Or  ondansetron (ZOFRAN) injection 4 mg (has no administration in time range)  insulin aspart (novoLOG) injection 0-15 Units (has no administration in time range)  insulin glargine (LANTUS) injection 40 Units (has no administration in time range)  enoxaparin (LOVENOX) injection 60 mg (60 mg Subcutaneous Given 02/08/19 0028)  dexamethasone (DECADRON) injection 6 mg (has no administration in time range)  remdesivir 200 mg in sodium chloride 0.9 % 250 mL IVPB (0 mg Intravenous Stopped 02/08/19 0111)    Followed by  remdesivir 100 mg in sodium chloride 0.9 % 250 mL IVPB (has no administration in time range)  piperacillin-tazobactam (ZOSYN) IVPB 3.375 g (has no administration in time range)  vancomycin (VANCOCIN) 2,000 mg in sodium chloride 0.9 % 500 mL IVPB (has no administration in time range)  dexamethasone (DECADRON) injection 6 mg (6 mg Intravenous Given 02/07/19 2100)  insulin aspart (novoLOG) injection 10 Units (10 Units Subcutaneous Given 02/07/19  2354)  insulin glargine (LANTUS) injection 40 Units (40 Units  Subcutaneous Given 02/08/19 0026)    Mobility walks Moderate fall risk   Focused Assessments Pulmonary Assessment Handoff:  Lung sounds: Bilateral Breath Sounds: Diminished L Breath Sounds: Diminished, Other (Comment) R Breath Sounds: Diminished, Other (Comment) O2 Device: Nasal Cannula O2 Flow Rate (L/min): 5 L/min      R Recommendations: See Admitting Provider Note  Report given to:   Additional Notes: covid postive @ 5 liters 02

## 2019-02-08 NOTE — Progress Notes (Signed)
Patient requested to speak with charge RN due to being unsatisfied with care. Patient's main complaints are the HEPA filter at bedside is too loud and his room doesn't have a TV or bathroom. Patient showed how to turn on TV and patient stated he knew how but he couldn't hear it because of the filter. Patient showed restroom but educated that due to SPO2 dropping into 60s on ambulation that that wasn't the safest option for him at this time, educated on availabe options. Patient states MD is not taking his complaints of leg pain seriously. Patient educated that pain meds are available as ordered but patient Patient remains disgruntled due to not being able to go to [facility] as he feels [facility] and [facility] are not equipped to handle his care. Patient educated on all resources available at both facilities and patient states his friends at [facility] tell him there are beds available over there and he could come. Patient educated on reasoning behind [place] and how he has been accepted by lead Covid MD. Patient states he doesn't want to risk his life by going and trying experimental drugs. Patient wanting to leave AMA then check in at [facility]. Educated that he most likely would go from there to [facility] as well. Thirty minutes spent updating multiple family members via telephone who felt he wasn't receiving the best care available. Family verbalizes understanding on care plan. Will continue to round on patient and communicate with care team to best coordinate care for this patient.

## 2019-02-08 NOTE — Progress Notes (Addendum)
Inpatient Diabetes Program Recommendations  AACE/ADA: New Consensus Statement on Inpatient Glycemic Control (2015)  Target Ranges:  Prepandial:   less than 140 mg/dL      Peak postprandial:   less than 180 mg/dL (1-2 hours)      Critically ill patients:  140 - 180 mg/dL   Lab Results  Component Value Date   GLUCAP 406 (H) 02/08/2019   HGBA1C 9.9 (H) 02/03/2019    Review of Glycemic Control Results for [name], [name] (MRN [id number]) as of 02/08/2019 09:40  Ref. Range 02/07/2019 23:49 02/08/2019 03:00 02/08/2019 08:36  Glucose-Capillary Latest Ref Range: 70 - 99 mg/dL 362 (H) Novolog 10units 362 (H) No coverage 406 (H) Novolog 15units    Diabetes history: DM2 Outpatient Diabetes medications: Novolog 0-15 TID with meals; Lantus 24 units HS; Lantus 40 units QAM Current orders for Inpatient glycemic control: Lantus 40 units QAM; Novolog 0-15 TID (Received Lantus at 0026 and 0905 this AM)  Inpatient Diabetes Program Recommendations:     -Please consider adding bedtime correction Novolog 0-5 QHS   Will watch CBG's after lunch for further recommendations as patient was given Lantus 40 units at 0026 & 0905.  @1327 -CBG at lunch is 380.  -Please consider adding Novolog 6units for meal coverage if eats 50%.    Thanks, Geoffry Paradise, RN, BSN Diabetes Coordinator [phone] (8a-5p)

## 2019-02-08 NOTE — Progress Notes (Signed)
Pharmacy Antibiotic Note  [name] Shryock is a 33 y.o. male admitted on 02/07/2019 with sepsis.  Pharmacy has been consulted for Vancomycin and Zosyn dosing.  Plan: Vancomycin 2gm iv x1, then Vancomycin 1500 mg IV Q 12 hrs. Goal AUC 400-550. Expected AUC: 493 SCr used: 1.08 Vd: 0.5  Zosyn 3.375gm iv x1, then Zosyn 3.375g IV Q8H infused over 4hrs.    Height: 6\' 5"  (195.6 cm) Weight: 263 lb 7.2 oz (119.5 kg) IBW/kg (Calculated) : 89.1  Temp (24hrs), Avg:101.7 F (38.7 C), Min:100.7 F (38.2 C), Max:102.7 F (39.3 C)  Recent Labs  Lab 02/03/19 1412 02/03/19 1604  02/03/19 2034 02/04/19 0220 02/05/19 0340 02/06/19 0354 02/07/19 1647 02/07/19 1848 02/07/19 1850 02/07/19 2100 02/07/19 2350  WBC 3.5*  --   --   --  2.3* 2.8* 2.9*  --  7.3  --   --  9.1  CREATININE 1.50*  --    < >  --  1.02 1.08 1.02 1.13  --   --   --  1.08  LATICACIDVEN 1.5 2.0*  --  1.8  --   --   --   --   --  1.3 1.4  --    < > = values in this interval not displayed.    Estimated Creatinine Clearance: 140.7 mL/min (by C-G formula based on SCr of 1.08 mg/dL).    Allergies  Allergen Reactions  . Basaglar Claiborne Rigg [Insulin Glargine] Diarrhea  . Metformin And Related Diarrhea and Nausea And Vomiting  . Trulicity [Dulaglutide] Diarrhea    Antimicrobials this admission: Vancomycin 02/08/2019 >> Zosyn 02/08/2019 >>   Dose adjustments this admission: -  Microbiology results: -  Thank you for allowing pharmacy to be a part of this patient's care.  Nani Skillern Crowford 02/08/2019 5:37 AM

## 2019-02-08 NOTE — Progress Notes (Signed)
Patient's aunt, Oris Drone, called again demanding to stop transfer to [facility]. Aunt stated that "there is no one at [facility] to care for patient's osteomyelitis or his 'surgery' there." RN educated Oris Drone that [name] can be treated medically at [facility] and that if needed, consults can be made. Bernice demanded to speak to someone else.   Administrative Coordinator contacted and made aware of situation, and will contact [place] as able.

## 2019-02-08 NOTE — Progress Notes (Signed)
Patient aunt called in and yelling at staff not to transfer patient to [facility]. Explained to patients family reasoning and that [place] is the best place for him to be. After de-escalation patients aunt understanding and receptive. [place] educated and supported as he is anxious about move.

## 2019-02-08 NOTE — ED Notes (Signed)
ADL,s pt walked to bed side restroom, felt sob and returned to bed. Bed changed and pt changed into hospital gown.

## 2019-02-08 NOTE — ED Notes (Signed)
Called lab confirmed cultures gathered yesterday @ 7

## 2019-02-09 LAB — CREATININE, SERUM
Creatinine, Ser: 1.06 mg/dL (ref 0.61–1.24)
GFR calc Af Amer: >60 mL/min (ref >60)
GFR calc non Af Amer: >60 mL/min (ref >60)

## 2019-02-09 LAB — GLUCOSE, CAPILLARY
Glucose-Capillary: 420 mg/dL — ABNORMAL HIGH (ref 70–99)
Glucose-Capillary: 383 mg/dL — ABNORMAL HIGH (ref 70–99)
Glucose-Capillary: 405 mg/dL — ABNORMAL HIGH (ref 70–99)
Glucose-Capillary: 391 mg/dL — ABNORMAL HIGH (ref 70–99)
Glucose-Capillary: 362 mg/dL — ABNORMAL HIGH (ref 70–99)
Glucose-Capillary: 353 mg/dL — ABNORMAL HIGH (ref 70–99)

## 2019-02-09 MED ORDER — VITAMIN C 500 MG PO TABS
500.0000 mg | ORAL_TABLET | Freq: Every day | ORAL | Status: DC
  Administered 2019-02-09 – 2019-02-14 (×6): 500 mg via ORAL
  Filled 2019-02-09 (×6): qty 1

## 2019-02-09 MED ORDER — AMOXICILLIN-POT CLAVULANATE 875-125 MG PO TABS
1.0000 | ORAL_TABLET | Freq: Two times a day (BID) | ORAL | Status: DC
  Administered 2019-02-09 – 2019-02-14 (×11): 1 via ORAL
  Filled 2019-02-09 (×13): qty 1

## 2019-02-09 MED ORDER — INSULIN GLARGINE 100 UNIT/ML ~~LOC~~ SOLN
24.0000 [IU] | Freq: Every day | SUBCUTANEOUS | Status: DC
  Administered 2019-02-09: 24 [IU] via SUBCUTANEOUS
  Filled 2019-02-09 (×2): qty 0.24

## 2019-02-09 NOTE — Progress Notes (Signed)
Pt appropriate, frustrated/ " angry"  about the upcoming surgery, partial amputation of left foot. Discussed plan of care,  antibiotics,dressing changes, and the need to control his diabetes. Patient states that he understands.

## 2019-02-09 NOTE — Plan of Care (Signed)
Problem: Education: Goal: Knowledge of General Education information will improve Description: Including pain rating scale, medication(s)/side effects and non-pharmacologic comfort measures Outcome: Progressing   Problem: Health Behavior/Discharge Planning: Goal: Ability to manage health-related needs will improve Outcome: Progressing   Problem: Nutrition: Goal: Adequate nutrition will be maintained Outcome: Progressing   Problem: Elimination: Goal: Will not experience complications related to urinary retention Outcome: Progressing   Problem: Pain Managment: Goal: General experience of comfort will improve Outcome: Progressing   Problem: Safety: Goal: Ability to remain free from injury will improve Outcome: Progressing   Problem: Skin Integrity: Goal: Risk for impaired skin integrity will decrease Outcome: Progressing   

## 2019-02-09 NOTE — Progress Notes (Signed)
Received bed assignment for [facility]) patient is refusing to be transferred at this time. He states " that I will not receive the same kind of care there." Advised patient that the care provided would be the same and the [place] would be the best place for him to be. Patient educated about the [place], remains addiment that he will not be transferred there.

## 2019-02-09 NOTE — Progress Notes (Signed)
PROGRESS NOTE    [name]  HOZ:[id number] DOB: [birth year] DOA: 02/07/2019 PCP: [name], MD  Outpatient Specialists:   Brief Narrative:  Patient is a 33 year old male with past medical history significant for diabetes mellitus type 2.  Patient was readmitted after 1 day of discharge from the hospital.  Apparently, patient was in the hospital for sepsis and osteomyelitis of the left foot.  During intermediate past inpatient stay, patient was found to have COVID-19 infection.  Patient presented with worsening shortness of breath following discharge from the hospital, nonproductive cough, fever and chills, weakness and fatigue.  Patient has been readmitted for management of COVID-19 infection.  The plan is to transfer patient to [facility] when a bed is available.  Further management depend on hospital course.  02/09/2019: Patient has refused transfer to [facility].  Will transfer patient to telemetry floor at [facility].  Continue management for Covid 19 infection.  Further management will depend on hospital course.  We discontinue IV vancomycin and Zosyn.  Restart Augmentin.  Patient was discharged on oral Augmentin, 10-day course after recent hospital stay for left foot infection/possible osteomyelitis.  Podiatry team plans for further work-up and management of the COVID-19 infection.  Assessment & Plan:   Principal Problem:   Acute respiratory failure with hypoxia (HCC) Active Problems:   Pneumonia due to COVID-19 virus   Uncontrolled type 1 diabetes mellitus with hyperglycemia (HCC)  Acute respiratory failure with hypoxia secondary to Covid pneumonia: -Continue Decadron and remdesivir.   -Continue zinc.  Continue vitamin C. -Supportive care. -Check inflammatory markers daily. -Further management will depend on hospital course.  Recent diagnosis of left foot ulceration with osteomyelitis: -Discontinue IV Zosyn and vancomycin.    -Start 10-day course of Augmentin.      Diabetes mellitus type 2 with hyperglycemia uncontrolled: -Continue subcutaneous Lantus and sliding scale insulin coverage.  -Monitor closely while patient is on steroids.  -Further management depend on hospital course.   DVT prophylaxis: Subcutaneous Lovenox Code Status: Full code Family Communication:  Disposition Plan: Transfer patient to [place] when a bed is available   Consultants:   None  Procedures:   None  Antimicrobials:   IV Zosyn  IV vancomycin  IV remdesivir   Subjective: Patient is slowly improving.   Fever is improving.   Shortness of breath is improving.   No productive cough.  Objective: Vitals:   02/09/19 0100 02/09/19 0400 02/09/19 0500 02/09/19 0600  BP: 132/67 136/76 136/72 (!) 132/49  Pulse: 86 86 80 85  Resp: (!) 30 (!) 24 18 20   Temp:  97.6 F (36.4 C)    TempSrc:  Oral    SpO2: 100% 93% 94% 91%  Weight:      Height:        Intake/Output Summary (Last 24 hours) at 02/09/2019 1049 Last data filed at 02/09/2019 0300 Gross per 24 hour  Intake 1306.4 ml  Output 2 ml  Net 1304.4 ml   Filed Weights   02/07/19 1646 02/08/19 0500  Weight: 122.3 kg 119.5 kg    Examination:  General exam: Appears calm and comfortable  Respiratory system: Decreased air entry. Cardiovascular system: S1 & S2 heard Gastrointestinal system: Abdomen is nondistended, soft and nontender. No organomegaly or masses felt. Normal bowel sounds heard. Central nervous system: Alert and oriented.  Patient moves all extremities.   Extremities: Left foot has some dressings.   Data Reviewed: I have personally reviewed following labs  and imaging studies  CBC: Recent Labs  Lab 02/03/19 1412  02/05/19 0340 02/06/19 0354 02/07/19 1848 02/07/19 2350 02/08/19 0612  WBC 3.5*   < > 2.8* 2.9* 7.3 9.1 10.4  NEUTROABS 2.5  --  2.0 1.9 5.6 7.6  --   HGB 13.4   < > 12.8* 12.0* 12.5* 13.4 13.4  HCT 41.4    < > 39.7 37.7* 39.6 42.1 42.4  MCV 84.5   < > 85.0 84.7 86.1 85.6 85.8  PLT 168   < > 134* 134* 161 164 168   < > = values in this interval not displayed.   Basic Metabolic Panel: Recent Labs  Lab 02/04/19 0220 02/05/19 0340 02/06/19 0354 02/07/19 1647 02/07/19 2350 02/08/19 0612 02/09/19 0253  NA 132* 129* 133* 126* 127*  --   --   K 4.3 4.3 4.1 4.5 5.0  --   --   CL 99 95* 98 91* 92*  --   --   CO2 25 24 26 27 25   --   --   GLUCOSE 312* 228* 175* 415* 384*  --   --   BUN 15 10 11 13 12   --   --   CREATININE 1.02 1.08 1.02 1.13 1.08 0.89 1.06  CALCIUM 8.2* 8.3* 8.1* 7.9* 8.1*  --   --   MG 1.9 1.9 1.9  --   --   --   --   PHOS 3.1 2.6 2.5  --   --   --   --    GFR: Estimated Creatinine Clearance: 143.3 mL/min (by C-G formula based on SCr of 1.06 mg/dL). Liver Function Tests: Recent Labs  Lab 02/04/19 1630 02/05/19 0340 02/06/19 0354 02/07/19 1647 02/07/19 2350  AST 26 25 31  46* 56*  ALT 28 26 25 27 29   ALKPHOS 65 59 56 59 62  BILITOT 0.8 0.7 0.5 0.8 1.1  PROT 6.4* 6.2* 5.8* 6.4* 6.9  ALBUMIN 2.9* 2.8* 2.5* 2.7* 2.8*   No results for input(s): LIPASE, AMYLASE in the last 168 hours. No results for input(s): AMMONIA in the last 168 hours. Coagulation Profile: Recent Labs  Lab 02/03/19 1523  INR 1.1   Cardiac Enzymes: No results for input(s): CKTOTAL, CKMB, CKMBINDEX, TROPONINI in the last 168 hours. BNP (last 3 results) No results for input(s): PROBNP in the last 8760 hours. HbA1C: No results for input(s): HGBA1C in the last 72 hours. CBG: Recent Labs  Lab 02/08/19 1611 02/08/19 2158 02/08/19 2356 02/09/19 0212 02/09/19 0908  GLUCAP 324* 392* 420* 405* 383*   Lipid Profile: Recent Labs    02/07/19 1647  TRIG 118   Thyroid Function Tests: No results for input(s): TSH, T4TOTAL, FREET4, T3FREE, THYROIDAB in the last 72 hours. Anemia Panel: Recent Labs    02/07/19 1647 02/07/19 2350  FERRITIN 487* 398*   Urine analysis:    Component Value  Date/Time   COLORURINE YELLOW 02/04/2019 0603   APPEARANCEUR CLEAR 02/04/2019 0603   LABSPEC 1.026 02/04/2019 0603   PHURINE 5.0 02/04/2019 0603   GLUCOSEU >=500 (A) 02/04/2019 0603   HGBUR NEGATIVE 02/04/2019 0603   BILIRUBINUR NEGATIVE 02/04/2019 0603   KETONESUR 5 (A) 02/04/2019 0603   PROTEINUR 100 (A) 02/04/2019 0603   UROBILINOGEN 0.2 11/07/2011 2241   NITRITE NEGATIVE 02/04/2019 0603   LEUKOCYTESUR NEGATIVE 02/04/2019 0603   Sepsis Labs: @LABRCNTIP (procalcitonin:4,lacticidven:4)  ) Recent Results (from the past 240 hour(s))  Novel Coronavirus, NAA (Hosp order, Send-out to Ref Lab; TAT 18-24 hrs  Status: Abnormal   Collection Time: 02/03/19  1:01 PM   Specimen: Nasopharyngeal Swab; Respiratory  Result Value Ref Range Status   SARS-CoV-2, NAA DETECTED (A) NOT DETECTED Final    Comment: RESULT CALLED TO, READ BACK BY AND VERIFIED WITH: J. [name], RN AT 1220 ON 02/04/19 BY C. JESSUP, MT. (NOTE)                  Client Requested Flag This nucleic acid amplification test was developed and its performance characteristics determined by [employer]. Nucleic acid amplification tests include PCR and TMA. This test has not been FDA cleared or approved. This test has been authorized by FDA under an Emergency Use Authorization (EUA). This test is only authorized for the duration of time the declaration that circumstances exist justifying the authorization of the emergency use of in vitro diagnostic tests for detection of SARS-CoV-2 virus and/or diagnosis of COVID-19 infection under section 564(b)(1) of the Act, 21 U.S.C. 960AVW-0(J) (1), unless the authorization is terminated or revoked sooner. When diagnostic testing is negative, the possibility of a false negative result should be considered in the context of a patient's recent exposures and the presenc e of clinical signs and symptoms consistent with COVID-19. An individual without symptoms of COVID- 19 and who is  not shedding SARS-CoV-2 virus would expect to have a negative (not detected) result in this assay. Performed At: [facility] [address] [place], [place] [id number] Jolene Schimke MD NF:[id number]    Coronavirus Source NASOPHARYNGEAL  Final    Comment: Performed at [facility], [address] [place], [place] [address]  SARS CORONAVIRUS 2 (TAT 6-24 HRS) Nasopharyngeal Nasopharyngeal Swab     Status: Abnormal   Collection Time: 02/03/19  3:22 PM   Specimen: Nasopharyngeal Swab  Result Value Ref Range Status   SARS Coronavirus 2 POSITIVE (A) NEGATIVE Final    Comment: RESULT CALLED TO, READ BACK BY AND VERIFIED WITH: B.LITTLE RN 1959 02/04/2019 MCCORMICK K (NOTE) SARS-CoV-2 target nucleic acids are DETECTED. The SARS-CoV-2 RNA is generally detectable in upper and lower respiratory specimens during the acute phase of infection. Positive results are indicative of active infection with SARS-CoV-2. Clinical  correlation with patient history and other diagnostic information is necessary to determine patient infection status. Positive results do  not rule out bacterial infection or co-infection with other viruses. The expected result is Negative. Fact Sheet for Patients: HairSlick.no Fact Sheet for Healthcare Providers: quierodirigir.com This test is not yet approved or cleared by the [place] FDA and  has been authorized for detection and/or diagnosis of SARS-CoV-2 by FDA under an Emergency Use Authorization (EUA). This EUA will remain  in effect (meaning this test can be used) f or the duration of the COVID-19 declaration under Section 564(b)(1) of the Act, 21 U.S.C. section 360bbb-3(b)(1), unless the authorization is terminated or revoked sooner. Performed at [facility], [address], [place], [place] [address]   Blood Culture (routine x 2)     Status: None   Collection Time: 02/03/19  3:53 PM    Specimen: BLOOD  Result Value Ref Range Status   Specimen Description BLOOD LEFT ANTECUBITAL  Final   Special Requests   Final    BOTTLES DRAWN AEROBIC AND ANAEROBIC Blood Culture adequate volume   Culture   Final    NO GROWTH 5 DAYS Performed at [facility], [address], [place], [place] [address]    Report Status 02/08/2019 FINAL  Final  Blood Culture (routine x 2)     Status: None   Collection Time: 02/03/19  4:11 PM   Specimen: BLOOD  Result Value Ref Range Status   Specimen Description BLOOD RIGHT ANTECUBITAL  Final   Special Requests   Final    BOTTLES DRAWN AEROBIC AND ANAEROBIC Blood Culture adequate volume   Culture   Final    NO GROWTH 5 DAYS Performed at [facility], [address], [place], [place] [address]    Report Status 02/08/2019 FINAL  Final  Urine culture     Status: None   Collection Time: 02/04/19  6:03 AM   Specimen: Urine, Random  Result Value Ref Range Status   Specimen Description URINE, RANDOM  Final   Special Requests NONE  Final   Culture   Final    NO GROWTH Performed at [facility], [address] [place], [place] [address]    Report Status 02/05/2019 FINAL  Final  Aerobic Culture (superficial specimen)     Status: None   Collection Time: 02/04/19  8:28 AM   Specimen: Foot; Wound  Result Value Ref Range Status   Specimen Description FOOT LEFT  Final   Special Requests SWAB SPEC  Final   Gram Stain   Final    FEW WBC PRESENT,BOTH PMN AND MONONUCLEAR RARE GRAM POSITIVE COCCI IN PAIRS Performed at [facility], [address], [place], [place] [address]    Culture   Final    FEW SERRATIA FONTICOLA FEW STAPHYLOCOCCUS LUGDUNENSIS    Report Status 02/08/2019 FINAL  Final   Organism ID, Bacteria SERRATIA FONTICOLA  Final   Organism ID, Bacteria STAPHYLOCOCCUS LUGDUNENSIS  Final      Susceptibility   Serratia fonticola - MIC*    CEFAZOLIN >=64 RESISTANT Resistant     CEFEPIME <=1 SENSITIVE Sensitive      CEFTAZIDIME <=1 SENSITIVE Sensitive     CEFTRIAXONE <=1 SENSITIVE Sensitive     CIPROFLOXACIN <=0.25 SENSITIVE Sensitive     GENTAMICIN 4 SENSITIVE Sensitive     IMIPENEM 2 SENSITIVE Sensitive     TRIMETH/SULFA <=20 SENSITIVE Sensitive     PIP/TAZO <=4 SENSITIVE Sensitive     * FEW SERRATIA FONTICOLA   Staphylococcus lugdunensis - MIC*    CIPROFLOXACIN <=0.5 SENSITIVE Sensitive     ERYTHROMYCIN >=8 RESISTANT Resistant     GENTAMICIN <=0.5 SENSITIVE Sensitive     OXACILLIN 2 SENSITIVE Sensitive     TETRACYCLINE <=1 SENSITIVE Sensitive     VANCOMYCIN <=0.5 SENSITIVE Sensitive     TRIMETH/SULFA <=10 SENSITIVE Sensitive     CLINDAMYCIN >=8 RESISTANT Resistant     RIFAMPIN <=0.5 SENSITIVE Sensitive     Inducible Clindamycin NEGATIVE Sensitive     * FEW STAPHYLOCOCCUS LUGDUNENSIS  Blood Culture (routine x 2)     Status: None (Preliminary result)   Collection Time: 02/07/19  6:07 PM   Specimen: BLOOD  Result Value Ref Range Status   Specimen Description   Final    BLOOD BLOOD RIGHT FOREARM Performed at [facility], [address], [place], [place] [address]    Special Requests   Final    BOTTLES DRAWN AEROBIC AND ANAEROBIC Blood Culture adequate volume Performed at [facility], [address], [place], [place] [address]    Culture   Final    NO GROWTH 2 DAYS Performed at [facility], [address], [place], [place] [address]  Report Status PENDING  Incomplete  Blood Culture (routine x 2)     Status: None (Preliminary result)   Collection Time: 02/07/19  6:07 PM   Specimen: BLOOD  Result Value Ref Range Status   Specimen Description   Final    BLOOD BLOOD LEFT FOREARM Performed at [facility], [address] [place], [place] [address]    Special Requests   Final    BOTTLES DRAWN AEROBIC AND ANAEROBIC Blood Culture results may not be optimal due to an inadequate volume of blood received in culture bottles  Performed at [facility], [address] [place], [place] [address]    Culture   Final    NO GROWTH 2 DAYS Performed at [facility], [address] [place], [place] [address]    Report Status PENDING  Incomplete  MRSA PCR Screening     Status: None   Collection Time: 02/08/19  6:12 AM   Specimen: Nasal Mucosa; Nasopharyngeal  Result Value Ref Range Status   MRSA by PCR NEGATIVE NEGATIVE Final    Comment:        The GeneXpert MRSA Assay (FDA approved for NASAL specimens only), is one component of a comprehensive MRSA colonization surveillance program. It is not intended to diagnose MRSA infection nor to guide or monitor treatment for MRSA infections. Performed at [facility], [address] [place], [place] [address]          Radiology Studies: Dg Chest Portable 1 View  Result Date: 02/07/2019 CLINICAL DATA:  Hypoxia, COVID-19, shortness of breath, fatigue, lays, hypoxemia EXAM: PORTABLE CHEST 1 VIEW COMPARISON:  Portable exam 1707 hours compared to 02/05/2019 FINDINGS: Borderline enlargement of cardiac silhouette. Stable mediastinal contours. Patchy airspace infiltrates bilaterally consistent with pneumonia and history of COVID, minimally increased in RIGHT upper lobe. No pleural effusion or pneumothorax. Bones unremarkable. IMPRESSION: BILATERAL pulmonary infiltrates consistent with pneumonia and history of COVID, slightly increased in RIGHT upper lobe. Electronically Signed   By: Ulyses SouthwardMark  Boles M.D.   On: 02/07/2019 17:28        Scheduled Meds: . Chlorhexidine Gluconate Cloth  6 each Topical Daily  . dexamethasone (DECADRON) injection  6 mg Intravenous Q24H  . enoxaparin (LOVENOX) injection  60 mg Subcutaneous QHS  . insulin aspart  0-15 Units Subcutaneous TID WC  . insulin glargine  40 Units Subcutaneous q morning - 10a  . multivitamin with minerals  1 tablet Oral Q breakfast  . vitamin C  500 mg Oral Daily  . zinc  sulfate  220 mg Oral Daily   Continuous Infusions: . piperacillin-tazobactam (ZOSYN)  IV Stopped (02/09/19 0920)  . remdesivir 100 mg in NS 250 mL    . vancomycin 1,500 mg (02/09/19 0438)     LOS: 2 days    Time spent: 35 minutes    Berton MountSylvester Marquasha Brutus, MD  [facility] Pager #: [phone] 7PM-7AM contact night coverage as above

## 2019-02-09 NOTE — Progress Notes (Signed)
Pt's mother, Leavy Cella, called this RN with concerns about patient's plan of care. Pt's mother said she is receiving conflicting information ("Some say pt has pneumonia, some say he does not, some say he has Covid, some say he does not, same about foot infection.") Pt's mother made aware that patient did test positive for Covid, and we are also treating him for pneumonia and osteomyelitis. Mother requested information on all courses of treatment- PO Augmentin, IV Decadron, IV Remdesivir (which patient is currently refusing)... Pt mother said she will speak to pt about refusing Remdesivir. Mother wants a different doctor to look at patient's foot. MD Ogbata paged per mother's request. Pt's mother very appreciative of this RN's time. Pt currently 95% 15L HFNC, resting comfortably in bed. Will continue to monitor.

## 2019-02-10 LAB — CBC WITH DIFFERENTIAL/PLATELET
Abs Immature Granulocytes: 0.14 10*3/uL — ABNORMAL HIGH (ref 0.00–0.07)
Basophils Absolute: 0 10*3/uL (ref 0.0–0.1)
Basophils Relative: 0 %
Eosinophils Absolute: 0 10*3/uL (ref 0.0–0.5)
Eosinophils Relative: 0 %
HCT: 40.7 % (ref 39.0–52.0)
Hemoglobin: 12.7 g/dL — ABNORMAL LOW (ref 13.0–17.0)
Immature Granulocytes: 2 %
Lymphocytes Relative: 10 %
Lymphs Abs: 0.7 10*3/uL (ref 0.7–4.0)
MCH: 26.7 pg (ref 26.0–34.0)
MCHC: 31.2 g/dL (ref 30.0–36.0)
MCV: 85.7 fL (ref 80.0–100.0)
Monocytes Absolute: 0.5 10*3/uL (ref 0.1–1.0)
Monocytes Relative: 7 %
Neutro Abs: 5.8 10*3/uL (ref 1.7–7.7)
Neutrophils Relative %: 81 %
Platelets: 324 10*3/uL (ref 150–400)
RBC: 4.75 MIL/uL (ref 4.22–5.81)
RDW: 12.5 % (ref 11.5–15.5)
WBC: 7.2 10*3/uL (ref 4.0–10.5)
nRBC: 0 % (ref 0.0–0.2)

## 2019-02-10 LAB — COMPREHENSIVE METABOLIC PANEL
ALT: 28 U/L (ref 0–44)
AST: 31 U/L (ref 15–41)
Albumin: 2.7 g/dL — ABNORMAL LOW (ref 3.5–5.0)
Alkaline Phosphatase: 67 U/L (ref 38–126)
Anion gap: 11 (ref 5–15)
BUN: 21 mg/dL — ABNORMAL HIGH (ref 6–20)
CO2: 26 mmol/L (ref 22–32)
Calcium: 8.8 mg/dL — ABNORMAL LOW (ref 8.9–10.3)
Chloride: 94 mmol/L — ABNORMAL LOW (ref 98–111)
Creatinine, Ser: 0.95 mg/dL (ref 0.61–1.24)
GFR calc Af Amer: >60 mL/min (ref >60)
GFR calc non Af Amer: >60 mL/min (ref >60)
Glucose, Bld: 516 mg/dL (ref 70–99)
Potassium: 4.9 mmol/L (ref 3.5–5.1)
Sodium: 131 mmol/L — ABNORMAL LOW (ref 135–145)
Total Bilirubin: 0.8 mg/dL (ref 0.3–1.2)
Total Protein: 6.8 g/dL (ref 6.5–8.1)

## 2019-02-10 LAB — GLUCOSE, CAPILLARY
Glucose-Capillary: 431 mg/dL — ABNORMAL HIGH (ref 70–99)
Glucose-Capillary: 427 mg/dL — ABNORMAL HIGH (ref 70–99)
Glucose-Capillary: 355 mg/dL — ABNORMAL HIGH (ref 70–99)
Glucose-Capillary: 319 mg/dL — ABNORMAL HIGH (ref 70–99)

## 2019-02-10 LAB — PROCALCITONIN: Procalcitonin: 0.29 ng/mL

## 2019-02-10 LAB — C-REACTIVE PROTEIN: CRP: 3.7 mg/dL — ABNORMAL HIGH (ref <1.0)

## 2019-02-10 LAB — FERRITIN: Ferritin: 483 ng/mL — ABNORMAL HIGH (ref 24–336)

## 2019-02-10 MED ORDER — DEXAMETHASONE 6 MG PO TABS
6.0000 mg | ORAL_TABLET | Freq: Every day | ORAL | Status: DC
  Administered 2019-02-11 – 2019-02-14 (×4): 6 mg via ORAL
  Filled 2019-02-10 (×5): qty 1

## 2019-02-10 MED ORDER — INSULIN ASPART 100 UNIT/ML ~~LOC~~ SOLN
0.0000 [IU] | Freq: Every day | SUBCUTANEOUS | Status: DC
  Administered 2019-02-10: 4 [IU] via SUBCUTANEOUS
  Administered 2019-02-11: 2 [IU] via SUBCUTANEOUS
  Administered 2019-02-12: 3 [IU] via SUBCUTANEOUS
  Administered 2019-02-13: 4 [IU] via SUBCUTANEOUS

## 2019-02-10 MED ORDER — INSULIN GLARGINE 100 UNIT/ML ~~LOC~~ SOLN
40.0000 [IU] | Freq: Every day | SUBCUTANEOUS | Status: DC
  Administered 2019-02-10 – 2019-02-13 (×5): 40 [IU] via SUBCUTANEOUS
  Filled 2019-02-10 (×6): qty 0.4

## 2019-02-10 MED ORDER — INSULIN ASPART 100 UNIT/ML ~~LOC~~ SOLN
0.0000 [IU] | Freq: Three times a day (TID) | SUBCUTANEOUS | Status: DC
  Administered 2019-02-10 (×2): 20 [IU] via SUBCUTANEOUS
  Administered 2019-02-11: 7 [IU] via SUBCUTANEOUS
  Administered 2019-02-11: 20 [IU] via SUBCUTANEOUS
  Administered 2019-02-11: 7 [IU] via SUBCUTANEOUS
  Administered 2019-02-12: 4 [IU] via SUBCUTANEOUS
  Administered 2019-02-12: 3 [IU] via SUBCUTANEOUS
  Administered 2019-02-13: 15 [IU] via SUBCUTANEOUS
  Administered 2019-02-13 (×2): 4 [IU] via SUBCUTANEOUS
  Administered 2019-02-14 (×2): 3 [IU] via SUBCUTANEOUS

## 2019-02-10 MED ORDER — INSULIN ASPART 100 UNIT/ML ~~LOC~~ SOLN
17.0000 [IU] | Freq: Once | SUBCUTANEOUS | Status: AC
  Administered 2019-02-10: 17 [IU] via SUBCUTANEOUS

## 2019-02-10 NOTE — Progress Notes (Signed)
Notified of critical BG 516, patient is receiving scheduled steroids for Covid. Primary RN advised to give scheduled Novolog 15 units at this time.    Rufina Falco, DNP, CCRN, FNP-C [facility] Nurse Practitioner Between 7pm to 7am - Pager [phone] Actively using [employer] secure chat messaging  After 7am go to [email or web address] - [email or web address] [place]  [facility] [employer]  [phone]

## 2019-02-10 NOTE — Plan of Care (Signed)

## 2019-02-10 NOTE — Progress Notes (Signed)
AM CBG 431. MD Ogbata paged to be made aware. Per MD, will give 17 units Novolog now and change patient to q4h CBGs. MD to also adjust Lantus dosages.

## 2019-02-10 NOTE — Progress Notes (Signed)
CRITICAL VALUE ALERT  Critical Value:  Glucose 516  Date & Time Notied:  02/10/19  Provider Notified: Stark Klein  Orders Received/Actions taken: No new orders at this time.

## 2019-02-10 NOTE — Progress Notes (Signed)
PROGRESS NOTE    [name]  EGB:[id number] DOB: 1986-01-16 DOA: 02/07/2019 PCP: [name], MD  Outpatient Specialists:   Brief Narrative:  Patient is a 33 year old male with past medical history significant for diabetes mellitus type 2.  Patient was readmitted after 1 day of discharge from the hospital.  Apparently, patient was in the hospital for sepsis and osteomyelitis of the left foot.  During intermediate past inpatient stay, patient was found to have COVID-19 infection.  Patient presented with worsening shortness of breath following discharge from the hospital, nonproductive cough, fever and chills, weakness and fatigue.  Patient has been readmitted for management of COVID-19 infection.  The plan is to transfer patient to [facility] when a bed is available.  Further management depend on hospital course.  02/09/2019: Patient has refused transfer to [facility].  Will transfer patient to telemetry floor at [facility].  Continue management for Covid 19 infection.  Further management will depend on hospital course.  We discontinue IV vancomycin and Zosyn.  Restart Augmentin.  Patient was discharged on oral Augmentin, 10-day course after recent hospital stay for left foot infection/possible osteomyelitis.  Podiatry team plans for further work-up and management of the COVID-19 infection.  02/10/2019: Patient seen.  Patient reports some improvement.  Discussed need to comply with management.  Advised patient to comply with remdesivir management.  Apparently, patient has been refusing remdesivir.  Blood sugars currently uncontrolled.  Will increase sliding scale coverage to resistant scale.  Will increase subcutaneous Lantus to 40 units twice daily.  We will continue to monitor blood sugar closely.  Assessment & Plan:   Principal Problem:   Acute respiratory failure with hypoxia (HCC) Active Problems:   Pneumonia due to COVID-19 virus  Uncontrolled type 1 diabetes mellitus with hyperglycemia (HCC)  Acute respiratory failure with hypoxia secondary to Covid pneumonia: -Continue Decadron and remdesivir.   -Continue zinc.  Continue vitamin C. -Supportive care. -Check inflammatory markers daily. -Further management will depend on hospital course. 02/10/2019: Patient seems to be improving.  Patient has been refusing remdesivir from time to time.  We will continue to monitor inflammatory markers.  Supplemental oxygen is down to 10 L/min.  Recent diagnosis of left foot ulceration with osteomyelitis: -Discontinue IV Zosyn and vancomycin.   -Start 10-day course of Augmentin.      Diabetes mellitus type 2 with hyperglycemia uncontrolled: -Continue subcutaneous Lantus and sliding scale insulin coverage.  -Monitor closely while patient is on steroids.  -Further management depend on hospital course. 02/10/2019: This is currently uncontrolled.  We will increase subcutaneous Lantus to 40 units twice daily.  We will increase the sliding scale insulin coverage to resistant scale.  We will continue to monitor closely.  DVT prophylaxis: Subcutaneous Lovenox Code Status: Full code Family Communication:  Disposition Plan: Likely discharge home eventually.   Consultants:   None  Procedures:   None  Antimicrobials:   IV Zosyn  IV vancomycin  IV remdesivir   Subjective: No documented fever over last 24 hours. Shortness of breath is improving.    Objective: Vitals:   02/10/19 0550 02/10/19 0917 02/10/19 1026 02/10/19 1139  BP: (!) 137/92   (!) 134/92  Pulse: 80   78  Resp: 20   20  Temp: 98.4 F (36.9 C)   97.8 F (36.6 C)  TempSrc: Oral   Oral  SpO2: 100% 98% 94% 100%  Weight:      Height:        Intake/Output  Summary (Last 24 hours) at 02/10/2019 1150 Last data filed at 02/10/2019 1100 Gross per 24 hour  Intake 610 ml  Output -  Net 610 ml   Filed Weights   02/07/19 1646 02/08/19 0500  Weight: 122.3 kg  119.5 kg    Examination:  General exam: Appears calm and comfortable  Respiratory system: Decreased air entry. Cardiovascular system: S1 & S2 heard Gastrointestinal system: Abdomen is nondistended, soft and nontender. No organomegaly or masses felt. Normal bowel sounds heard. Central nervous system: Alert and oriented.  Patient moves all extremities.   Extremities: Left foot has some dressings.   Data Reviewed: I have personally reviewed following labs and imaging studies  CBC: Recent Labs  Lab 02/05/19 0340 02/06/19 0354 02/07/19 1848 02/07/19 2350 02/08/19 0612 02/10/19 0420  WBC 2.8* 2.9* 7.3 9.1 10.4 7.2  NEUTROABS 2.0 1.9 5.6 7.6  --  5.8  HGB 12.8* 12.0* 12.5* 13.4 13.4 12.7*  HCT 39.7 37.7* 39.6 42.1 42.4 40.7  MCV 85.0 84.7 86.1 85.6 85.8 85.7  PLT 134* 134* 161 164 168 324   Basic Metabolic Panel: Recent Labs  Lab 02/04/19 0220 02/05/19 0340 02/06/19 0354 02/07/19 1647 02/07/19 2350 02/08/19 0612 02/09/19 0253 02/10/19 0420  NA 132* 129* 133* 126* 127*  --   --  131*  K 4.3 4.3 4.1 4.5 5.0  --   --  4.9  CL 99 95* 98 91* 92*  --   --  94*  CO2 --   --  26  GLUCOSE 312* 228* 175* 415* 384*  --   --  516*  BUN --   --  21*  CREATININE 1.02 1.08 1.02 1.13 1.08 0.89 1.06 0.95  CALCIUM 8.2* 8.3* 8.1* 7.9* 8.1*  --   --  8.8*  MG 1.9 1.9 1.9  --   --   --   --   --   PHOS 3.1 2.6 2.5  --   --   --   --   --    GFR: Estimated Creatinine Clearance: 159.9 mL/min (by C-G formula based on SCr of 0.95 mg/dL). Liver Function Tests: Recent Labs  Lab 02/05/19 0340 02/06/19 0354 02/07/19 1647 02/07/19 2350 02/10/19 0420  AST 25 31 46* 56* 31  ALT ALKPHOS 59 56 59 62 67  BILITOT 0.7 0.5 0.8 1.1 0.8  PROT 6.2* 5.8* 6.4* 6.9 6.8  ALBUMIN 2.8* 2.5* 2.7* 2.8* 2.7*   No results for input(s): LIPASE, AMYLASE in the last 168 hours. No results for input(s): AMMONIA in the last 168 hours. Coagulation Profile:  Recent Labs  Lab 02/03/19 1523  INR 1.1   Cardiac Enzymes: No results for input(s): CKTOTAL, CKMB, CKMBINDEX, TROPONINI in the last 168 hours. BNP (last 3 results) No results for input(s): PROBNP in the last 8760 hours. HbA1C: No results for input(s): HGBA1C in the last 72 hours. CBG: Recent Labs  Lab 02/09/19 0908 02/09/19 1250 02/09/19 1655 02/09/19 2202 02/10/19 0826  GLUCAP 383* 391* 362* 353* 431*   Lipid Profile: Recent Labs    02/07/19 1647  TRIG 118   Thyroid Function Tests: No results for input(s): TSH, T4TOTAL, FREET4, T3FREE, THYROIDAB in the last 72 hours. Anemia Panel: Recent Labs    02/07/19 2350 02/10/19 0420  FERRITIN 398* 483*   Urine analysis:    Component Value Date/Time   COLORURINE YELLOW 02/04/2019 0603   APPEARANCEUR  CLEAR 02/04/2019 0603   LABSPEC 1.026 02/04/2019 0603   PHURINE 5.0 02/04/2019 0603   GLUCOSEU >=500 (A) 02/04/2019 0603   HGBUR NEGATIVE 02/04/2019 0603   BILIRUBINUR NEGATIVE 02/04/2019 0603   KETONESUR 5 (A) 02/04/2019 0603   PROTEINUR 100 (A) 02/04/2019 0603   UROBILINOGEN 0.2 11/07/2011 2241   NITRITE NEGATIVE 02/04/2019 0603   LEUKOCYTESUR NEGATIVE 02/04/2019 0603   Sepsis Labs: (procalcitonin:4,lacticidven:4)  ) Recent Results (from the past 240 hour(s))  Novel Coronavirus, NAA (Hosp order, Send-out to Ref Lab; TAT 18-24 hrs     Status: Abnormal   Collection Time: 02/03/19  1:01 PM   Specimen: Nasopharyngeal Swab; Respiratory  Result Value Ref Range Status   SARS-CoV-2, NAA DETECTED (A) NOT DETECTED Final    Comment: RESULT CALLED TO, READ BACK BY AND VERIFIED WITH: J. [name], RN AT 1220 ON 02/04/19 BY C. JESSUP, MT. (NOTE)                  Client Requested Flag This nucleic acid amplification test was developed and its performance characteristics determined by [employer]. Nucleic acid amplification tests include PCR and TMA. This test has not been FDA cleared or approved. This test has  been authorized by FDA under an Emergency Use Authorization (EUA). This test is only authorized for the duration of time the declaration that circumstances exist justifying the authorization of the emergency use of in vitro diagnostic tests for detection of SARS-CoV-2 virus and/or diagnosis of COVID-19 infection under section 564(b)(1) of the Act, 21 U.S.C. 161WRU-0(A) (1), unless the authorization is terminated or revoked sooner. When diagnostic testing is negative, the possibility of a false negative result should be considered in the context of a patient's recent exposures and the presenc e of clinical signs and symptoms consistent with COVID-19. An individual without symptoms of COVID- 19 and who is not shedding SARS-CoV-2 virus would expect to have a negative (not detected) result in this assay. Performed At: [facility] [address] [place], [place] [id number] Jolene Schimke MD YN:[id number]    Coronavirus Source NASOPHARYNGEAL  Final    Comment: Performed at [facility], [address] [place], [place] [address]  SARS CORONAVIRUS 2 (TAT 6-24 HRS) Nasopharyngeal Nasopharyngeal Swab     Status: Abnormal   Collection Time: 02/03/19  3:22 PM   Specimen: Nasopharyngeal Swab  Result Value Ref Range Status   SARS Coronavirus 2 POSITIVE (A) NEGATIVE Final    Comment: RESULT CALLED TO, READ BACK BY AND VERIFIED WITH: B.LITTLE RN 1959 02/04/2019 MCCORMICK K (NOTE) SARS-CoV-2 target nucleic acids are DETECTED. The SARS-CoV-2 RNA is generally detectable in upper and lower respiratory specimens during the acute phase of infection. Positive results are indicative of active infection with SARS-CoV-2. Clinical  correlation with patient history and other diagnostic information is necessary to determine patient infection status. Positive results do  not rule out bacterial infection or co-infection with other viruses. The expected result is Negative. Fact Sheet for  Patients: HairSlick.no Fact Sheet for Healthcare Providers: quierodirigir.com This test is not yet approved or cleared by the [place] FDA and  has been authorized for detection and/or diagnosis of SARS-CoV-2 by FDA under an Emergency Use Authorization (EUA). This EUA will remain  in effect (meaning this test can be used) f or the duration of the COVID-19 declaration under Section 564(b)(1) of the Act, 21 U.S.C. section 360bbb-3(b)(1), unless the authorization is terminated or revoked sooner. Performed at [facility], [address] [place], [place] [address]   Blood Culture (routine x 2)     Status: None   Collection Time: 02/03/19  3:53 PM   Specimen: BLOOD  Result Value Ref Range Status   Specimen Description BLOOD LEFT ANTECUBITAL  Final   Special Requests   Final    BOTTLES DRAWN AEROBIC AND ANAEROBIC Blood Culture adequate volume   Culture   Final    NO GROWTH 5 DAYS Performed at [facility], [address] [place], [place] [address]    Report Status 02/08/2019 FINAL  Final  Blood Culture (routine x 2)     Status: None   Collection Time: 02/03/19  4:11 PM   Specimen: BLOOD  Result Value Ref Range Status   Specimen Description BLOOD RIGHT ANTECUBITAL  Final   Special Requests   Final    BOTTLES DRAWN AEROBIC AND ANAEROBIC Blood Culture adequate volume   Culture   Final    NO GROWTH 5 DAYS Performed at [facility], [address] [place], [place] [address]    Report Status 02/08/2019 FINAL  Final  Urine culture     Status: None   Collection Time: 02/04/19  6:03 AM   Specimen: Urine, Random  Result Value Ref Range Status   Specimen Description URINE, RANDOM  Final   Special Requests NONE  Final   Culture   Final    NO GROWTH Performed at [facility], [address] [place], [place] [address]    Report Status 02/05/2019 FINAL  Final  Aerobic Culture (superficial specimen)      Status: None   Collection Time: 02/04/19  8:28 AM   Specimen: Foot; Wound  Result Value Ref Range Status   Specimen Description FOOT LEFT  Final   Special Requests SWAB SPEC  Final   Gram Stain   Final    FEW WBC PRESENT,BOTH PMN AND MONONUCLEAR RARE GRAM POSITIVE COCCI IN PAIRS Performed at [facility], [address] [place], [place] [address]    Culture   Final    FEW SERRATIA FONTICOLA FEW STAPHYLOCOCCUS LUGDUNENSIS    Report Status 02/08/2019 FINAL  Final   Organism ID, Bacteria SERRATIA FONTICOLA  Final   Organism ID, Bacteria STAPHYLOCOCCUS LUGDUNENSIS  Final      Susceptibility   Serratia fonticola - MIC*    CEFAZOLIN >=64 RESISTANT Resistant     CEFEPIME <=1 SENSITIVE Sensitive     CEFTAZIDIME <=1 SENSITIVE Sensitive     CEFTRIAXONE <=1 SENSITIVE Sensitive     CIPROFLOXACIN <=0.25 SENSITIVE Sensitive     GENTAMICIN 4 SENSITIVE Sensitive     IMIPENEM 2 SENSITIVE Sensitive     TRIMETH/SULFA <=20 SENSITIVE Sensitive     PIP/TAZO <=4 SENSITIVE Sensitive     * FEW SERRATIA FONTICOLA   Staphylococcus lugdunensis - MIC*    CIPROFLOXACIN <=0.5 SENSITIVE Sensitive     ERYTHROMYCIN >=8 RESISTANT Resistant     GENTAMICIN <=0.5 SENSITIVE Sensitive     OXACILLIN 2 SENSITIVE Sensitive     TETRACYCLINE <=1 SENSITIVE Sensitive     VANCOMYCIN <=0.5 SENSITIVE Sensitive     TRIMETH/SULFA <=10 SENSITIVE Sensitive     CLINDAMYCIN >=8 RESISTANT Resistant     RIFAMPIN <=0.5 SENSITIVE Sensitive     Inducible Clindamycin NEGATIVE Sensitive     * FEW STAPHYLOCOCCUS LUGDUNENSIS  Blood Culture (routine x 2)     Status: None (Preliminary result)   Collection Time: 02/07/19  6:07 PM  Specimen: BLOOD  Result Value Ref Range Status   Specimen Description   Final    BLOOD BLOOD RIGHT FOREARM Performed at [facility] [address] [place], [place] [address]    Special Requests   Final    BOTTLES DRAWN AEROBIC AND ANAEROBIC Blood Culture adequate volume  Performed at [facility] [address] [place], [place] [address]    Culture   Final    NO GROWTH 3 DAYS Performed at [facility], [place] [address] [place], [place] [address]    Report Status PENDING  Incomplete  Blood Culture (routine x 2)     Status: None (Preliminary result)   Collection Time: 02/07/19  6:07 PM   Specimen: BLOOD  Result Value Ref Range Status   Specimen Description   Final    BLOOD BLOOD LEFT FOREARM Performed at [facility] [address] [place], [place] [address]    Special Requests   Final    BOTTLES DRAWN AEROBIC AND ANAEROBIC Blood Culture results may not be optimal due to an inadequate volume of blood received in culture bottles Performed at [place] [address] [place], [place] [address]    Culture   Final    NO GROWTH 3 DAYS Performed at [facility], [place] [address], [place], [place] [address]    Report Status PENDING  Incomplete  MRSA PCR Screening     Status: None   Collection Time: 02/08/19  6:12 AM   Specimen: Nasal Mucosa; Nasopharyngeal  Result Value Ref Range Status   MRSA by PCR NEGATIVE NEGATIVE Final    Comment:        The GeneXpert MRSA Assay (FDA approved for NASAL specimens only), is one component of a comprehensive MRSA colonization surveillance program. It is not intended to diagnose MRSA infection nor to guide or monitor treatment for MRSA infections. Performed at [facility], [place] [address] [place], [place] [address]          Radiology Studies: No results found.      Scheduled Meds: . amoxicillin-clavulanate  1 tablet Oral Q12H  . Chlorhexidine Gluconate Cloth  6 each Topical Daily  . [START ON 02/11/2019] dexamethasone  6 mg Oral Q breakfast  . enoxaparin (LOVENOX) injection  60 mg Subcutaneous QHS  . insulin aspart  0-20 Units Subcutaneous TID WC  . insulin aspart  0-5 Units Subcutaneous QHS  . insulin  glargine  40 Units Subcutaneous q morning - 10a  . insulin glargine  40 Units Subcutaneous QHS  . multivitamin with minerals  1 tablet Oral Q breakfast  . vitamin C  500 mg Oral Daily  . zinc sulfate  220 mg Oral Daily   Continuous Infusions: . remdesivir 100 mg in NS 250 mL Stopped (02/09/19 2250)     LOS: 3 days    Time spent: 25 minutes    Dana Allan, MD  [facility] Pager #: [phone] 7PM-7AM contact night coverage as above

## 2019-02-11 LAB — COMPREHENSIVE METABOLIC PANEL
ALT: 64 U/L — ABNORMAL HIGH (ref 0–44)
AST: 96 U/L — ABNORMAL HIGH (ref 15–41)
Albumin: 2.9 g/dL — ABNORMAL LOW (ref 3.5–5.0)
Alkaline Phosphatase: 64 U/L (ref 38–126)
Anion gap: 12 (ref 5–15)
BUN: 19 mg/dL (ref 6–20)
CO2: 27 mmol/L (ref 22–32)
Calcium: 9.4 mg/dL (ref 8.9–10.3)
Chloride: 97 mmol/L — ABNORMAL LOW (ref 98–111)
Creatinine, Ser: 0.92 mg/dL (ref 0.61–1.24)
GFR calc Af Amer: >60 mL/min (ref >60)
GFR calc non Af Amer: >60 mL/min (ref >60)
Glucose, Bld: 273 mg/dL — ABNORMAL HIGH (ref 70–99)
Potassium: 4.1 mmol/L (ref 3.5–5.1)
Sodium: 136 mmol/L (ref 135–145)
Total Bilirubin: 0.3 mg/dL (ref 0.3–1.2)
Total Protein: 7.3 g/dL (ref 6.5–8.1)

## 2019-02-11 LAB — CBC WITH DIFFERENTIAL/PLATELET
Abs Immature Granulocytes: 0.36 10*3/uL — ABNORMAL HIGH (ref 0.00–0.07)
Basophils Absolute: 0.1 10*3/uL (ref 0.0–0.1)
Basophils Relative: 1 %
Eosinophils Absolute: 0 10*3/uL (ref 0.0–0.5)
Eosinophils Relative: 1 %
HCT: 44.8 % (ref 39.0–52.0)
Hemoglobin: 13.7 g/dL (ref 13.0–17.0)
Immature Granulocytes: 5 %
Lymphocytes Relative: 18 %
Lymphs Abs: 1.4 10*3/uL (ref 0.7–4.0)
MCH: 26.4 pg (ref 26.0–34.0)
MCHC: 30.6 g/dL (ref 30.0–36.0)
MCV: 86.5 fL (ref 80.0–100.0)
Monocytes Absolute: 0.5 10*3/uL (ref 0.1–1.0)
Monocytes Relative: 7 %
Neutro Abs: 5 10*3/uL (ref 1.7–7.7)
Neutrophils Relative %: 68 %
Platelets: 366 10*3/uL (ref 150–400)
RBC: 5.18 MIL/uL (ref 4.22–5.81)
RDW: 12.6 % (ref 11.5–15.5)
WBC: 7.4 10*3/uL (ref 4.0–10.5)
nRBC: 0.3 % — ABNORMAL HIGH (ref 0.0–0.2)

## 2019-02-11 LAB — C-REACTIVE PROTEIN: CRP: 1.4 mg/dL — ABNORMAL HIGH (ref <1.0)

## 2019-02-11 LAB — FERRITIN: Ferritin: 492 ng/mL — ABNORMAL HIGH (ref 24–336)

## 2019-02-11 LAB — GLUCOSE, CAPILLARY
Glucose-Capillary: 285 mg/dL — ABNORMAL HIGH (ref 70–99)
Glucose-Capillary: 241 mg/dL — ABNORMAL HIGH (ref 70–99)
Glucose-Capillary: 239 mg/dL — ABNORMAL HIGH (ref 70–99)
Glucose-Capillary: 353 mg/dL — ABNORMAL HIGH (ref 70–99)
Glucose-Capillary: 222 mg/dL — ABNORMAL HIGH (ref 70–99)

## 2019-02-11 LAB — PROCALCITONIN: Procalcitonin: 0.13 ng/mL

## 2019-02-11 NOTE — Progress Notes (Signed)
PROGRESS NOTE    [name]  MGQ:[id number] DOB: [birth year] DOA: 02/07/2019 PCP: [name], MD  Outpatient Specialists:   Brief Narrative:  Patient is a 33 year old male with past medical history significant for diabetes mellitus type 2.  Patient was readmitted after 1 day of discharge from the hospital.  Apparently, patient was in the hospital for sepsis and osteomyelitis of the left foot.  During intermediate past inpatient stay, patient was found to have COVID-19 infection.  Patient presented with worsening shortness of breath following discharge from the hospital, nonproductive cough, fever and chills, weakness and fatigue.  Patient has been readmitted for management of COVID-19 infection.  The plan is to transfer patient to [facility] when a bed is available.  Further management depend on hospital course.  02/09/2019: Patient has refused transfer to [facility].  Will transfer patient to telemetry floor at [facility].  Continue management for Covid 19 infection.  Further management will depend on hospital course.  We discontinue IV vancomycin and Zosyn.  Restart Augmentin.  Patient was discharged on oral Augmentin, 10-day course after recent hospital stay for left foot infection/possible osteomyelitis.  Podiatry team plans for further work-up and management of the COVID-19 infection.  02/10/2019: Patient seen.  Patient reports some improvement.  Discussed need to comply with management.  Advised patient to comply with remdesivir management.  Apparently, patient has been refusing remdesivir.  Blood sugars currently uncontrolled.  Will increase sliding scale coverage to resistant scale.  Will increase subcutaneous Lantus to 40 units twice daily.  We will continue to monitor blood sugar closely.  02/11/2019: Patient continues to report improvement.  Blood sugar control is improving.  Continue current management.  Assessment & Plan:   Principal  Problem:   Acute respiratory failure with hypoxia (HCC) Active Problems:   Pneumonia due to COVID-19 virus   Uncontrolled type 1 diabetes mellitus with hyperglycemia (HCC)  Acute respiratory failure with hypoxia secondary to Covid pneumonia: -Continue Decadron and remdesivir.   -Continue zinc.  Continue vitamin C. -Supportive care. -Check inflammatory markers daily. -Further management will depend on hospital course. 02/10/2019: Patient seems to be improving.  Patient has been refusing remdesivir from time to time.  We will continue to monitor inflammatory markers.  Supplemental oxygen is down to 10 L/min. 02/11/2019: Respiratory symptoms are improving.  Complete course of Decadron and remdesivir.  Titrate supplemental oxygen as tolerated.  Recent diagnosis of left foot ulceration with osteomyelitis: -Discontinue IV Zosyn and vancomycin.   -Start 10-day course of Augmentin.      Diabetes mellitus type 2 with hyperglycemia uncontrolled: -Continue subcutaneous Lantus and sliding scale insulin coverage.  -Monitor closely while patient is on steroids.  -Further management depend on hospital course. 02/10/2019: This is currently uncontrolled.  We will increase subcutaneous Lantus to 40 units twice daily.  We will increase the sliding scale insulin coverage to resistant scale.  We will continue to monitor closely. 02/11/2019: Blood sugar control is improving.  Continue current management.  DVT prophylaxis: Subcutaneous Lovenox Code Status: Full code Family Communication:  Disposition Plan: Likely discharge home eventually.   Consultants:   None  Procedures:   None  Antimicrobials:   IV Zosyn  IV vancomycin  IV remdesivir   Subjective: No fever or chills. Shortness of breath continues to improve  Objective: Vitals:   02/10/19 0917 02/10/19 1026 02/10/19 1139 02/10/19 2159  BP:   (!) 134/92 104/71  Pulse:   78 88  Resp:  20 18  Temp:   97.8 F (36.6 C) 98 F (36.7  C)  TempSrc:   Oral Oral  SpO2: 98% 94% 100% 100%  Weight:      Height:        Intake/Output Summary (Last 24 hours) at 02/11/2019 1306 Last data filed at 02/11/2019 1000 Gross per 24 hour  Intake 370 ml  Output -  Net 370 ml   Filed Weights   02/07/19 1646 02/08/19 0500  Weight: 122.3 kg 119.5 kg    Examination:  General exam: Appears calm and comfortable  Respiratory system: Decreased air entry. Cardiovascular system: S1 & S2 heard Gastrointestinal system: Abdomen is nondistended, soft and nontender. No organomegaly or masses felt. Normal bowel sounds heard. Central nervous system: Alert and oriented.  Patient moves all extremities.   Extremities: Left foot has some dressings.   Data Reviewed: I have personally reviewed following labs and imaging studies  CBC: Recent Labs  Lab 02/06/19 0354 02/07/19 1848 02/07/19 2350 02/08/19 0612 02/10/19 0420 02/11/19 0335  WBC 2.9* 7.3 9.1 10.4 7.2 7.4  NEUTROABS 1.9 5.6 7.6  --  5.8 5.0  HGB 12.0* 12.5* 13.4 13.4 12.7* 13.7  HCT 37.7* 39.6 42.1 42.4 40.7 44.8  MCV 84.7 86.1 85.6 85.8 85.7 86.5  PLT 134* 161 164 168 324 366   Basic Metabolic Panel: Recent Labs  Lab 02/05/19 0340 02/06/19 0354 02/07/19 1647 02/07/19 2350 02/08/19 0612 02/09/19 0253 02/10/19 0420 02/11/19 0335  NA 129* 133* 126* 127*  --   --  131* 136  K 4.3 4.1 4.5 5.0  --   --  4.9 4.1  CL 95* 98 91* 92*  --   --  94* 97*  CO2 --   --  26 27  GLUCOSE 228* 175* 415* 384*  --   --  516* 273*  BUN --   --  21* 19  CREATININE 1.08 1.02 1.13 1.08 0.89 1.06 0.95 0.92  CALCIUM 8.3* 8.1* 7.9* 8.1*  --   --  8.8* 9.4  MG 1.9 1.9  --   --   --   --   --   --   PHOS 2.6 2.5  --   --   --   --   --   --    GFR: Estimated Creatinine Clearance: 165.2 mL/min (by C-G formula based on SCr of 0.92 mg/dL). Liver Function Tests: Recent Labs  Lab 02/06/19 0354 02/07/19 1647 02/07/19 2350 02/10/19 0420 02/11/19 0335  AST 31 46*  56* 31 96*  ALT 64*  ALKPHOS 56 59 62 67 64  BILITOT 0.5 0.8 1.1 0.8 0.3  PROT 5.8* 6.4* 6.9 6.8 7.3  ALBUMIN 2.5* 2.7* 2.8* 2.7* 2.9*   No results for input(s): LIPASE, AMYLASE in the last 168 hours. No results for input(s): AMMONIA in the last 168 hours. Coagulation Profile: No results for input(s): INR, PROTIME in the last 168 hours. Cardiac Enzymes: No results for input(s): CKTOTAL, CKMB, CKMBINDEX, TROPONINI in the last 168 hours. BNP (last 3 results) No results for input(s): PROBNP in the last 8760 hours. HbA1C: No results for input(s): HGBA1C in the last 72 hours. CBG: Recent Labs  Lab 02/10/19 1650 02/10/19 2154 02/11/19 0244 02/11/19 0859 02/11/19 1200  GLUCAP 355* 319* 285* 241* 239*   Lipid Profile: No results for input(s): CHOL, HDL, LDLCALC, TRIG, CHOLHDL, LDLDIRECT in the last 72 hours. Thyroid Function  Tests: No results for input(s): TSH, T4TOTAL, FREET4, T3FREE, THYROIDAB in the last 72 hours. Anemia Panel: Recent Labs    02/10/19 0420 02/11/19 0335  FERRITIN 483* 492*   Urine analysis:    Component Value Date/Time   COLORURINE YELLOW 02/04/2019 0603   APPEARANCEUR CLEAR 02/04/2019 0603   LABSPEC 1.026 02/04/2019 0603   PHURINE 5.0 02/04/2019 0603   GLUCOSEU >=500 (A) 02/04/2019 0603   HGBUR NEGATIVE 02/04/2019 0603   BILIRUBINUR NEGATIVE 02/04/2019 0603   KETONESUR 5 (A) 02/04/2019 0603   PROTEINUR 100 (A) 02/04/2019 0603   UROBILINOGEN 0.2 11/07/2011 2241   NITRITE NEGATIVE 02/04/2019 0603   LEUKOCYTESUR NEGATIVE 02/04/2019 0603   Sepsis Labs: @LABRCNTIP (procalcitonin:4,lacticidven:4)  ) Recent Results (from the past 240 hour(s))  Novel Coronavirus, NAA (Hosp order, Send-out to Ref Lab; TAT 18-24 hrs     Status: Abnormal   Collection Time: 02/03/19  1:01 PM   Specimen: Nasopharyngeal Swab; Respiratory  Result Value Ref Range Status   SARS-CoV-2, NAA DETECTED (A) NOT DETECTED Final    Comment: RESULT CALLED TO, READ BACK BY  AND VERIFIED WITH: J. [name], RN AT 1220 ON 02/04/19 BY C. JESSUP, MT. (NOTE)                  Client Requested Flag This nucleic acid amplification test was developed and its performance characteristics determined by [facility]. Nucleic acid amplification tests include PCR and TMA. This test has not been FDA cleared or approved. This test has been authorized by FDA under an Emergency Use Authorization (EUA). This test is only authorized for the duration of time the declaration that circumstances exist justifying the authorization of the emergency use of in vitro diagnostic tests for detection of SARS-CoV-2 virus and/or diagnosis of COVID-19 infection under section 564(b)(1) of the Act, 21 U.S.C. 045WUJ-8(J360bbb-3(b) (1), unless the authorization is terminated or revoked sooner. When diagnostic testing is negative, the possibility of a false negative result should be considered in the context of a patient's recent exposures and the presenc e of clinical signs and symptoms consistent with COVID-19. An individual without symptoms of COVID- 19 and who is not shedding SARS-CoV-2 virus would expect to have a negative (not detected) result in this assay. Performed At: [facility] [address] [place], [place] [id number] Jolene SchimkeNagendra Sanjai MD AO:[id number]:[phone]    Coronavirus Source NASOPHARYNGEAL  Final    Comment: Performed at [facility], [address] [place], [place] [address]  SARS CORONAVIRUS 2 (TAT 6-24 HRS) Nasopharyngeal Nasopharyngeal Swab     Status: Abnormal   Collection Time: 02/03/19  3:22 PM   Specimen: Nasopharyngeal Swab  Result Value Ref Range Status   SARS Coronavirus 2 POSITIVE (A) NEGATIVE Final    Comment: RESULT CALLED TO, READ BACK BY AND VERIFIED WITH: B.LITTLE RN 1959 02/04/2019 MCCORMICK K (NOTE) SARS-CoV-2 target nucleic acids are DETECTED. The SARS-CoV-2 RNA is generally detectable in upper and lower respiratory specimens during the acute phase  of infection. Positive results are indicative of active infection with SARS-CoV-2. Clinical  correlation with patient history and other diagnostic information is necessary to determine patient infection status. Positive results do  not rule out bacterial infection or co-infection with other viruses. The expected result is Negative. Fact Sheet for Patients: HairSlick.nohttps://www.fda.gov/media/138098/download Fact Sheet for Healthcare Providers: quierodirigir.comhttps://www.fda.gov/media/138095/download This test is not yet approved or cleared by the [place] FDA and  has been authorized for detection and/or diagnosis of SARS-CoV-2 by FDA under an Emergency Use Authorization (  EUA). This EUA will remain  in effect (meaning this test can be used) f or the duration of the COVID-19 declaration under Section 564(b)(1) of the Act, 21 U.S.C. section 360bbb-3(b)(1), unless the authorization is terminated or revoked sooner. Performed at [facility], [address] [place], [place] [address]   Blood Culture (routine x 2)     Status: None   Collection Time: 02/03/19  3:53 PM   Specimen: BLOOD  Result Value Ref Range Status   Specimen Description BLOOD LEFT ANTECUBITAL  Final   Special Requests   Final    BOTTLES DRAWN AEROBIC AND ANAEROBIC Blood Culture adequate volume   Culture   Final    NO GROWTH 5 DAYS Performed at [facility], [address], [place], [place] [address]    Report Status 02/08/2019 FINAL  Final  Blood Culture (routine x 2)     Status: None   Collection Time: 02/03/19  4:11 PM   Specimen: BLOOD  Result Value Ref Range Status   Specimen Description BLOOD RIGHT ANTECUBITAL  Final   Special Requests   Final    BOTTLES DRAWN AEROBIC AND ANAEROBIC Blood Culture adequate volume   Culture   Final    NO GROWTH 5 DAYS Performed at [facility], [address], [place], [place] [address]    Report Status 02/08/2019 FINAL  Final  Urine culture     Status: None   Collection  Time: 02/04/19  6:03 AM   Specimen: Urine, Random  Result Value Ref Range Status   Specimen Description URINE, RANDOM  Final   Special Requests NONE  Final   Culture   Final    NO GROWTH Performed at [facility], [address], [place], [place] [address]    Report Status 02/05/2019 FINAL  Final  Aerobic Culture (superficial specimen)     Status: None   Collection Time: 02/04/19  8:28 AM   Specimen: Foot; Wound  Result Value Ref Range Status   Specimen Description FOOT LEFT  Final   Special Requests SWAB SPEC  Final   Gram Stain   Final    FEW WBC PRESENT,BOTH PMN AND MONONUCLEAR RARE GRAM POSITIVE COCCI IN PAIRS Performed at [facility], [address], [place], [place] [address]    Culture   Final    FEW SERRATIA FONTICOLA FEW STAPHYLOCOCCUS LUGDUNENSIS    Report Status 02/08/2019 FINAL  Final   Organism ID, Bacteria SERRATIA FONTICOLA  Final   Organism ID, Bacteria STAPHYLOCOCCUS LUGDUNENSIS  Final      Susceptibility   Serratia fonticola - MIC*    CEFAZOLIN >=64 RESISTANT Resistant     CEFEPIME <=1 SENSITIVE Sensitive     CEFTAZIDIME <=1 SENSITIVE Sensitive     CEFTRIAXONE <=1 SENSITIVE Sensitive     CIPROFLOXACIN <=0.25 SENSITIVE Sensitive     GENTAMICIN 4 SENSITIVE Sensitive     IMIPENEM 2 SENSITIVE Sensitive     TRIMETH/SULFA <=20 SENSITIVE Sensitive     PIP/TAZO <=4 SENSITIVE Sensitive     * FEW SERRATIA FONTICOLA   Staphylococcus lugdunensis - MIC*    CIPROFLOXACIN <=0.5 SENSITIVE Sensitive     ERYTHROMYCIN >=8 RESISTANT Resistant     GENTAMICIN <=0.5 SENSITIVE Sensitive     OXACILLIN 2 SENSITIVE Sensitive     TETRACYCLINE <=1 SENSITIVE Sensitive     VANCOMYCIN <=0.5 SENSITIVE Sensitive     TRIMETH/SULFA <=10 SENSITIVE Sensitive     CLINDAMYCIN >=8 RESISTANT  Resistant     RIFAMPIN <=0.5 SENSITIVE Sensitive     Inducible Clindamycin NEGATIVE Sensitive     * FEW STAPHYLOCOCCUS LUGDUNENSIS  Blood Culture (routine x 2)     Status: None  (Preliminary result)   Collection Time: 02/07/19  6:07 PM   Specimen: BLOOD  Result Value Ref Range Status   Specimen Description   Final    BLOOD BLOOD RIGHT FOREARM Performed at [facility], [address] [place], [place] [address]    Special Requests   Final    BOTTLES DRAWN AEROBIC AND ANAEROBIC Blood Culture adequate volume Performed at [facility], [address], [place], [place] [address]    Culture   Final    NO GROWTH 4 DAYS Performed at [facility], [address] [place], [place] [address]    Report Status PENDING  Incomplete  Blood Culture (routine x 2)     Status: None (Preliminary result)   Collection Time: 02/07/19  6:07 PM   Specimen: BLOOD  Result Value Ref Range Status   Specimen Description   Final    BLOOD BLOOD LEFT FOREARM Performed at [facility], [address] [place], [place] [address]    Special Requests   Final    BOTTLES DRAWN AEROBIC AND ANAEROBIC Blood Culture results may not be optimal due to an inadequate volume of blood received in culture bottles Performed at [facility], [address] [place], [place] [address]    Culture   Final    NO GROWTH 4 DAYS Performed at [facility], [address] [place], [place] [address]    Report Status PENDING  Incomplete  MRSA PCR Screening     Status: None   Collection Time: 02/08/19  6:12 AM   Specimen: Nasal Mucosa; Nasopharyngeal  Result Value Ref Range Status   MRSA by PCR NEGATIVE NEGATIVE Final    Comment:        The GeneXpert MRSA Assay (FDA approved for NASAL specimens only), is one component of a comprehensive MRSA colonization surveillance program. It is not intended to diagnose MRSA infection nor to guide or monitor treatment for MRSA infections. Performed at [facility], [address], [place], [place] [address]          Radiology Studies: No results found.       Scheduled Meds: . amoxicillin-clavulanate  1 tablet Oral Q12H  . Chlorhexidine Gluconate Cloth  6 each Topical Daily  . dexamethasone  6 mg Oral Q breakfast  . enoxaparin (LOVENOX) injection  60 mg Subcutaneous QHS  . insulin aspart  0-20 Units Subcutaneous TID WC  . insulin aspart  0-5 Units Subcutaneous QHS  . insulin glargine  40 Units Subcutaneous q morning - 10a  . insulin glargine  40 Units Subcutaneous QHS  . multivitamin with minerals  1 tablet Oral Q breakfast  . vitamin C  500 mg Oral Daily  . zinc sulfate  220 mg Oral Daily   Continuous Infusions: . remdesivir 100 mg in NS 250 mL Stopped (02/10/19 2309)     LOS: 4 days    Time spent: 25 minutes    Berton Mount, MD  [facility] Pager #: [phone] 7PM-7AM contact night coverage as above

## 2019-02-11 NOTE — Plan of Care (Signed)

## 2019-02-12 ENCOUNTER — Ambulatory Visit (HOSPITAL_COMMUNITY): Admission: RE | Admit: 2019-02-12 | Payer: BC Managed Care – PPO | Source: Ambulatory Visit

## 2019-02-12 ENCOUNTER — Encounter (HOSPITAL_COMMUNITY): Payer: Self-pay

## 2019-02-12 ENCOUNTER — Inpatient Hospital Stay (HOSPITAL_COMMUNITY): Payer: BC Managed Care – PPO

## 2019-02-12 DIAGNOSIS — J1289 Other viral pneumonia: Secondary | ICD-10-CM | POA: Diagnosis present

## 2019-02-12 DIAGNOSIS — U071 COVID-19: Secondary | ICD-10-CM | POA: Diagnosis present

## 2019-02-12 DIAGNOSIS — J1282 Pneumonia due to coronavirus disease 2019: Secondary | ICD-10-CM | POA: Diagnosis present

## 2019-02-12 LAB — CULTURE, BLOOD (ROUTINE X 2)
Culture: NO GROWTH
Culture: NO GROWTH
Special Requests: ADEQUATE

## 2019-02-12 LAB — GLUCOSE, CAPILLARY
Glucose-Capillary: 102 mg/dL — ABNORMAL HIGH (ref 70–99)
Glucose-Capillary: 81 mg/dL (ref 70–99)
Glucose-Capillary: 157 mg/dL — ABNORMAL HIGH (ref 70–99)
Glucose-Capillary: 128 mg/dL — ABNORMAL HIGH (ref 70–99)
Glucose-Capillary: 295 mg/dL — ABNORMAL HIGH (ref 70–99)

## 2019-02-12 LAB — COMPREHENSIVE METABOLIC PANEL
ALT: 61 U/L — ABNORMAL HIGH (ref 0–44)
ALT: 55 U/L — ABNORMAL HIGH (ref 0–44)
AST: 56 U/L — ABNORMAL HIGH (ref 15–41)
AST: 52 U/L — ABNORMAL HIGH (ref 15–41)
Albumin: 3.2 g/dL — ABNORMAL LOW (ref 3.5–5.0)
Albumin: 3 g/dL — ABNORMAL LOW (ref 3.5–5.0)
Alkaline Phosphatase: 64 U/L (ref 38–126)
Alkaline Phosphatase: 61 U/L (ref 38–126)
Anion gap: 13 (ref 5–15)
Anion gap: 9 (ref 5–15)
BUN: 19 mg/dL (ref 6–20)
BUN: 18 mg/dL (ref 6–20)
CO2: 27 mmol/L (ref 22–32)
CO2: 29 mmol/L (ref 22–32)
Calcium: 9.3 mg/dL (ref 8.9–10.3)
Calcium: 8.6 mg/dL — ABNORMAL LOW (ref 8.9–10.3)
Chloride: 99 mmol/L (ref 98–111)
Chloride: 99 mmol/L (ref 98–111)
Creatinine, Ser: 0.95 mg/dL (ref 0.61–1.24)
Creatinine, Ser: 1.02 mg/dL (ref 0.61–1.24)
GFR calc Af Amer: >60 mL/min (ref >60)
GFR calc Af Amer: >60 mL/min (ref >60)
GFR calc non Af Amer: >60 mL/min (ref >60)
GFR calc non Af Amer: >60 mL/min (ref >60)
Glucose, Bld: 103 mg/dL — ABNORMAL HIGH (ref 70–99)
Glucose, Bld: 149 mg/dL — ABNORMAL HIGH (ref 70–99)
Potassium: 3.6 mmol/L (ref 3.5–5.1)
Potassium: 3.6 mmol/L (ref 3.5–5.1)
Sodium: 139 mmol/L (ref 135–145)
Sodium: 137 mmol/L (ref 135–145)
Total Bilirubin: 0.7 mg/dL (ref 0.3–1.2)
Total Bilirubin: 0.9 mg/dL (ref 0.3–1.2)
Total Protein: 7.5 g/dL (ref 6.5–8.1)
Total Protein: 6.8 g/dL (ref 6.5–8.1)

## 2019-02-12 LAB — CBC WITH DIFFERENTIAL/PLATELET
Abs Immature Granulocytes: 0.45 10*3/uL — ABNORMAL HIGH (ref 0.00–0.07)
Abs Immature Granulocytes: 0.44 10*3/uL — ABNORMAL HIGH (ref 0.00–0.07)
Basophils Absolute: 0.1 10*3/uL (ref 0.0–0.1)
Basophils Absolute: 0.1 10*3/uL (ref 0.0–0.1)
Basophils Relative: 1 %
Basophils Relative: 1 %
Eosinophils Absolute: 0.1 10*3/uL (ref 0.0–0.5)
Eosinophils Absolute: 0.2 10*3/uL (ref 0.0–0.5)
Eosinophils Relative: 1 %
Eosinophils Relative: 2 %
HCT: 46.1 % (ref 39.0–52.0)
HCT: 42.8 % (ref 39.0–52.0)
Hemoglobin: 14.4 g/dL (ref 13.0–17.0)
Hemoglobin: 13.3 g/dL (ref 13.0–17.0)
Immature Granulocytes: 6 %
Immature Granulocytes: 5 %
Lymphocytes Relative: 26 %
Lymphocytes Relative: 13 %
Lymphs Abs: 1.8 10*3/uL (ref 0.7–4.0)
Lymphs Abs: 1.2 10*3/uL (ref 0.7–4.0)
MCH: 26.7 pg (ref 26.0–34.0)
MCH: 27 pg (ref 26.0–34.0)
MCHC: 31.2 g/dL (ref 30.0–36.0)
MCHC: 31.1 g/dL (ref 30.0–36.0)
MCV: 85.5 fL (ref 80.0–100.0)
MCV: 86.8 fL (ref 80.0–100.0)
Monocytes Absolute: 0.6 10*3/uL (ref 0.1–1.0)
Monocytes Absolute: 0.8 10*3/uL (ref 0.1–1.0)
Monocytes Relative: 8 %
Monocytes Relative: 9 %
Neutro Abs: 4 10*3/uL (ref 1.7–7.7)
Neutro Abs: 6.1 10*3/uL (ref 1.7–7.7)
Neutrophils Relative %: 58 %
Neutrophils Relative %: 70 %
Platelets: 328 10*3/uL (ref 150–400)
Platelets: 322 10*3/uL (ref 150–400)
RBC: 5.39 MIL/uL (ref 4.22–5.81)
RBC: 4.93 MIL/uL (ref 4.22–5.81)
RDW: 12.6 % (ref 11.5–15.5)
RDW: 12.7 % (ref 11.5–15.5)
WBC: 7 10*3/uL (ref 4.0–10.5)
WBC: 8.8 10*3/uL (ref 4.0–10.5)
nRBC: 0 % (ref 0.0–0.2)
nRBC: 0 % (ref 0.0–0.2)

## 2019-02-12 LAB — TROPONIN I (HIGH SENSITIVITY): Troponin I (High Sensitivity): 8 ng/L (ref <18)

## 2019-02-12 LAB — D-DIMER, QUANTITATIVE: D-Dimer, Quant: 5.67 ug/mL-FEU — ABNORMAL HIGH (ref 0.00–0.50)

## 2019-02-12 LAB — FERRITIN: Ferritin: 376 ng/mL — ABNORMAL HIGH (ref 24–336)

## 2019-02-12 LAB — PROCALCITONIN: Procalcitonin: <0.1 ng/mL

## 2019-02-12 LAB — C-REACTIVE PROTEIN: CRP: 1.3 mg/dL — ABNORMAL HIGH (ref <1.0)

## 2019-02-12 MED ORDER — SODIUM CHLORIDE 0.9 % IV BOLUS
500.0000 mL | Freq: Once | INTRAVENOUS | Status: AC
  Administered 2019-02-12: 500 mL via INTRAVENOUS

## 2019-02-12 MED ORDER — ENOXAPARIN SODIUM 120 MG/0.8ML ~~LOC~~ SOLN
1.0000 mg/kg | Freq: Two times a day (BID) | SUBCUTANEOUS | Status: DC
  Administered 2019-02-12 – 2019-02-13 (×2): 120 mg via SUBCUTANEOUS
  Filled 2019-02-12 (×4): qty 0.8

## 2019-02-12 MED ORDER — IOHEXOL 350 MG/ML SOLN
100.0000 mL | Freq: Once | INTRAVENOUS | Status: AC | PRN
  Administered 2019-02-12: 100 mL via INTRAVENOUS

## 2019-02-12 NOTE — Progress Notes (Signed)
PROGRESS NOTE    [name]  XBD:[id number] DOB: 10/13/1985 DOA: 02/07/2019 PCP: [name], MD  Outpatient Specialists:   Brief Narrative:  Patient is a 33 year old male with past medical history significant for diabetes mellitus type 2.  Patient was readmitted 1 day after discharge from the hospital.  Apparently, patient was in the hospital for sepsis and osteomyelitis of the left foot.  During immediate past inpatient stay, patient was found to have COVID-19 infection.  Patient refused management with remdesivir on previous admission.  Patient represented with worsening shortness of breath, nonproductive cough, fever and chills, weakness and fatigue, and may be readmitted for management of COVID-19 infection.  Patient has been managed with Decadron and remdesivir (patient has completed a course of remdesivir), however, patient refused remdesivir from time to time.  Blood sugar was initially significantly elevated, but control has improved significantly.  02/12/2019: Patient reported feeling weak today.  Blood pressure was also noted to be on the low side, with systolic in the 83M.  Patient has responded to volume resuscitation.  D-dimer done today was 5.67.  Patient is currently on subcu Lovenox 60 mg daily.  Will increase to full dose Lovenox therapy.  Will obtain CTA chest to rule out PE.  Will transfer patient to stepdown unit.  Further management will depend on hospital course.  Assessment & Plan:   Principal Problem:   Acute respiratory failure with hypoxia (HCC) Active Problems:   Pneumonia due to COVID-19 virus   Uncontrolled type 1 diabetes mellitus with hyperglycemia (HCC)  Acute respiratory failure with hypoxia secondary to Covid pneumonia: -Patient has completed a course of remdesivir.  However, patient refused remdesivir from time to time.     -Complete course of Decadron.     -Continue zinc.  Continue vitamin C. -Supportive care. -Continue to check check inflammatory  markers daily. -Patient reported feeling well today.  Systolic blood pressure in the 80s were noted. -D-dimer was elevated at 5.67. -We will change subcutaneous Lovenox therapy to full dose anticoagulation therapy -We will obtain CTA chest to rule out pulmonary embolism.  Recent diagnosis of left foot ulceration with osteomyelitis: -Discontinued IV Zosyn and vancomycin.   -Complete 10-day course of Augmentin.      Diabetes mellitus type 2 with hyperglycemia uncontrolled: -Continue subcutaneous Lantus and sliding scale insulin coverage.  -Blood sugar control is improving.  DVT prophylaxis: Subcutaneous Lovenox Code Status: Full code Family Communication:  Disposition Plan: Likely discharge home eventually.   Consultants:   None  Procedures:   None  Antimicrobials:   IV Zosyn discontinued  IV vancomycin discontinued  Completed course of IV remdesivir  Subjective: Reports feeling weak Shortness of breath continues to improve (none on 3 L/min of supplemental oxygen via nasal cannula)  Objective: Vitals:   02/11/19 2135 02/12/19 0320 02/12/19 0344 02/12/19 0550  BP: 135/82 99/64 116/78 117/72  Pulse: 73 89 83 90  Resp: 18 19 18 16   Temp: 98 F (36.7 C) (!) 97.2 F (36.2 C)  98.1 F (36.7 C)  TempSrc: Oral Oral  Oral  SpO2: 93% 99% 97% 95%  Weight:      Height:        Intake/Output Summary (Last 24 hours) at 02/12/2019 0836 Last data filed at 02/12/2019 0600 Gross per 24 hour  Intake 489.66 ml  Output -  Net 489.66 ml   Filed Weights   02/07/19 1646 02/08/19 0500  Weight: 122.3 kg 119.5 kg    Examination:  General exam: Weak looking Respiratory  system: Decreased air entry. Cardiovascular system: S1 & S2 heard Gastrointestinal system: Abdomen is nondistended, soft and nontender. No organomegaly or masses felt. Normal bowel sounds heard. Central nervous system: Alert and oriented.  Patient moves all extremities.   Extremities: Left foot has some  dressings.   Data Reviewed: I have personally reviewed following labs and imaging studies  CBC: Recent Labs  Lab 02/07/19 1848 02/07/19 2350 02/08/19 0612 02/10/19 0420 02/11/19 0335 02/12/19 0347  WBC 7.3 9.1 10.4 7.2 7.4 7.0  NEUTROABS 5.6 7.6  --  5.8 5.0 4.0  HGB 12.5* 13.4 13.4 12.7* 13.7 14.4  HCT 39.6 42.1 42.4 40.7 44.8 46.1  MCV 86.1 85.6 85.8 85.7 86.5 85.5  PLT 161 164 168 324 366 328   Basic Metabolic Panel: Recent Labs  Lab 02/06/19 0354 02/07/19 1647 02/07/19 2350 02/08/19 0612 02/09/19 0253 02/10/19 0420 02/11/19 0335 02/12/19 0347  NA 133* 126* 127*  --   --  131* 136 139  K 4.1 4.5 5.0  --   --  4.9 4.1 3.6  CL 98 91* 92*  --   --  94* 97* 99  CO2 --   --  GLUCOSE 175* 415* 384*  --   --  516* 273* 103*  BUN --   --  21* 19 19  CREATININE 1.02 1.13 1.08 0.89 1.06 0.95 0.92 0.95  CALCIUM 8.1* 7.9* 8.1*  --   --  8.8* 9.4 9.3  MG 1.9  --   --   --   --   --   --   --   PHOS 2.5  --   --   --   --   --   --   --    GFR: Estimated Creatinine Clearance: 159.9 mL/min (by C-G formula based on SCr of 0.95 mg/dL). Liver Function Tests: Recent Labs  Lab 02/07/19 1647 02/07/19 2350 02/10/19 0420 02/11/19 0335 02/12/19 0347  AST 46* 56* 31 96* 56*  ALT 64* 61*  ALKPHOS 59 62 67 64 64  BILITOT 0.8 1.1 0.8 0.3 0.7  PROT 6.4* 6.9 6.8 7.3 7.5  ALBUMIN 2.7* 2.8* 2.7* 2.9* 3.2*   No results for input(s): LIPASE, AMYLASE in the last 168 hours. No results for input(s): AMMONIA in the last 168 hours. Coagulation Profile: No results for input(s): INR, PROTIME in the last 168 hours. Cardiac Enzymes: No results for input(s): CKTOTAL, CKMB, CKMBINDEX, TROPONINI in the last 168 hours. BNP (last 3 results) No results for input(s): PROBNP in the last 8760 hours. HbA1C: No results for input(s): HGBA1C in the last 72 hours. CBG: Recent Labs  Lab 02/11/19 1200 02/11/19 1721 02/11/19 2132 02/12/19 0312 02/12/19 0821   GLUCAP 239* 353* 222* 102* 81   Lipid Profile: No results for input(s): CHOL, HDL, LDLCALC, TRIG, CHOLHDL, LDLDIRECT in the last 72 hours. Thyroid Function Tests: No results for input(s): TSH, T4TOTAL, FREET4, T3FREE, THYROIDAB in the last 72 hours. Anemia Panel: Recent Labs    02/11/19 0335 02/12/19 0347  FERRITIN 492* 376*   Urine analysis:    Component Value Date/Time   COLORURINE YELLOW 02/04/2019 0603   APPEARANCEUR CLEAR 02/04/2019 0603   LABSPEC 1.026 02/04/2019 0603   PHURINE 5.0 02/04/2019 0603   GLUCOSEU >=500 (A) 02/04/2019 0603   HGBUR NEGATIVE 02/04/2019 0603   BILIRUBINUR NEGATIVE 02/04/2019 0603   KETONESUR 5 (A) 02/04/2019 0603   PROTEINUR 100 (A)  02/04/2019 0603   UROBILINOGEN 0.2 11/07/2011 2241   NITRITE NEGATIVE 02/04/2019 0603   LEUKOCYTESUR NEGATIVE 02/04/2019 0603   Sepsis Labs: @LABRCNTIP (procalcitonin:4,lacticidven:4)  ) Recent Results (from the past 240 hour(s))  Novel Coronavirus, NAA (Hosp order, Send-out to Ref Lab; TAT 18-24 hrs     Status: Abnormal   Collection Time: 02/03/19  1:01 PM   Specimen: Nasopharyngeal Swab; Respiratory  Result Value Ref Range Status   SARS-CoV-2, NAA DETECTED (A) NOT DETECTED Final    Comment: RESULT CALLED TO, READ BACK BY AND VERIFIED WITH: J. [name], RN AT 1220 ON 02/04/19 BY C. JESSUP, MT. (NOTE)                  Client Requested Flag This nucleic acid amplification test was developed and its performance characteristics determined by 13/2/20. Nucleic acid amplification tests include PCR and TMA. This test has not been FDA cleared or approved. This test has been authorized by FDA under an Emergency Use Authorization (EUA). This test is only authorized for the duration of time the declaration that circumstances exist justifying the authorization of the emergency use of in vitro diagnostic tests for detection of SARS-CoV-2 virus and/or diagnosis of COVID-19 infection under section 564(b)(1) of  the Act, 21 U.S.C. [employer]) (1), unless the authorization is terminated or revoked sooner. When diagnostic testing is negative, the possibility of a false negative result should be considered in the context of a patient's recent exposures and the presenc e of clinical signs and symptoms consistent with COVID-19. An individual without symptoms of COVID- 19 and who is not shedding SARS-CoV-2 virus would expect to have a negative (not detected) result in this assay. Performed At: [facility] [address] [place], [place] [id number] MD Jolene Schimke    Coronavirus Source NASOPHARYNGEAL  Final    Comment: Performed at [facility], [address] [place], [place]  SARS CORONAVIRUS 2 (TAT 6-24 HRS) Nasopharyngeal Nasopharyngeal Swab     Status: Abnormal   Collection Time: 02/03/19  3:22 PM   Specimen: Nasopharyngeal Swab  Result Value Ref Range Status   SARS Coronavirus 2 POSITIVE (A) NEGATIVE Final    Comment: RESULT CALLED TO, READ BACK BY AND VERIFIED WITH: B.LITTLE RN 1959 02/04/2019 MCCORMICK K (NOTE) SARS-CoV-2 target nucleic acids are DETECTED. The SARS-CoV-2 RNA is generally detectable in upper and lower respiratory specimens during the acute phase of infection. Positive results are indicative of active infection with SARS-CoV-2. Clinical  correlation with patient history and other diagnostic information is necessary to determine patient infection status. Positive results do  not rule out bacterial infection or co-infection with other viruses. The expected result is Negative. Fact Sheet for Patients: 13/05/2018 Fact Sheet for Healthcare Providers: HairSlick.no This test is not yet approved or cleared by the quierodirigir.com FDA and  has been authorized for detection and/or diagnosis of SARS-CoV-2 by FDA under an Emergency Use Authorization (EUA). This EUA will remain  in  effect (meaning this test can be used) f or the duration of the COVID-19 declaration under Section 564(b)(1) of the Act, 21 U.S.C. section 360bbb-3(b)(1), unless the authorization is terminated or revoked sooner. Performed at [facility], [address], [place], [place]   Blood Culture (routine x 2)     Status: None   Collection Time: 02/03/19  3:53 PM   Specimen: BLOOD  Result Value Ref Range Status   Specimen Description BLOOD LEFT ANTECUBITAL  Final  Special Requests   Final    BOTTLES DRAWN AEROBIC AND ANAEROBIC Blood Culture adequate volume   Culture   Final    NO GROWTH 5 DAYS Performed at [facility], [address] [place], [place] [address]    Report Status 02/08/2019 FINAL  Final  Blood Culture (routine x 2)     Status: None   Collection Time: 02/03/19  4:11 PM   Specimen: BLOOD  Result Value Ref Range Status   Specimen Description BLOOD RIGHT ANTECUBITAL  Final   Special Requests   Final    BOTTLES DRAWN AEROBIC AND ANAEROBIC Blood Culture adequate volume   Culture   Final    NO GROWTH 5 DAYS Performed at [facility], [address] [place], [place] [address]    Report Status 02/08/2019 FINAL  Final  Urine culture     Status: None   Collection Time: 02/04/19  6:03 AM   Specimen: Urine, Random  Result Value Ref Range Status   Specimen Description URINE, RANDOM  Final   Special Requests NONE  Final   Culture   Final    NO GROWTH Performed at [facility], [address] [place], [place] [address]    Report Status 02/05/2019 FINAL  Final  Aerobic Culture (superficial specimen)     Status: None   Collection Time: 02/04/19  8:28 AM   Specimen: Foot; Wound  Result Value Ref Range Status   Specimen Description FOOT LEFT  Final   Special Requests SWAB SPEC  Final   Gram Stain   Final    FEW WBC PRESENT,BOTH PMN AND MONONUCLEAR RARE GRAM POSITIVE COCCI IN PAIRS Performed at [facility], [address]  [place], [place] [address]    Culture   Final    FEW SERRATIA FONTICOLA FEW STAPHYLOCOCCUS LUGDUNENSIS    Report Status 02/08/2019 FINAL  Final   Organism ID, Bacteria SERRATIA FONTICOLA  Final   Organism ID, Bacteria STAPHYLOCOCCUS LUGDUNENSIS  Final      Susceptibility   Serratia fonticola - MIC*    CEFAZOLIN >=64 RESISTANT Resistant     CEFEPIME <=1 SENSITIVE Sensitive     CEFTAZIDIME <=1 SENSITIVE Sensitive     CEFTRIAXONE <=1 SENSITIVE Sensitive     CIPROFLOXACIN <=0.25 SENSITIVE Sensitive     GENTAMICIN 4 SENSITIVE Sensitive     IMIPENEM 2 SENSITIVE Sensitive     TRIMETH/SULFA <=20 SENSITIVE Sensitive     PIP/TAZO <=4 SENSITIVE Sensitive     * FEW SERRATIA FONTICOLA   Staphylococcus lugdunensis - MIC*    CIPROFLOXACIN <=0.5 SENSITIVE Sensitive     ERYTHROMYCIN >=8 RESISTANT Resistant     GENTAMICIN <=0.5 SENSITIVE Sensitive     OXACILLIN 2 SENSITIVE Sensitive     TETRACYCLINE <=1 SENSITIVE Sensitive     VANCOMYCIN <=0.5 SENSITIVE Sensitive     TRIMETH/SULFA <=10 SENSITIVE Sensitive     CLINDAMYCIN >=8 RESISTANT Resistant     RIFAMPIN <=0.5 SENSITIVE Sensitive     Inducible Clindamycin NEGATIVE Sensitive     * FEW STAPHYLOCOCCUS LUGDUNENSIS  Blood Culture (routine x 2)     Status: None   Collection Time: 02/07/19  6:07 PM   Specimen: BLOOD  Result Value Ref Range Status   Specimen Description   Final    BLOOD BLOOD RIGHT FOREARM Performed at [facility], [address] [place], [place] [address]    Special Requests   Final    BOTTLES DRAWN  AEROBIC AND ANAEROBIC Blood Culture adequate volume Performed at [facility], [address], [place], [place] [address]    Culture   Final    NO GROWTH 5 DAYS Performed at [facility], [address] [place], [place] [address]    Report Status 02/12/2019 FINAL  Final  Blood Culture (routine x 2)     Status: None   Collection Time: 02/07/19  6:07 PM   Specimen: BLOOD  Result Value  Ref Range Status   Specimen Description   Final    BLOOD BLOOD LEFT FOREARM Performed at [facility], [address], [place], [place] [address]    Special Requests   Final    BOTTLES DRAWN AEROBIC AND ANAEROBIC Blood Culture results may not be optimal due to an inadequate volume of blood received in culture bottles Performed at [facility], [address], [place], [place] [address]    Culture   Final    NO GROWTH 5 DAYS Performed at [facility], [address], [place], [place] [address]    Report Status 02/12/2019 FINAL  Final  MRSA PCR Screening     Status: None   Collection Time: 02/08/19  6:12 AM   Specimen: Nasal Mucosa; Nasopharyngeal  Result Value Ref Range Status   MRSA by PCR NEGATIVE NEGATIVE Final    Comment:        The GeneXpert MRSA Assay (FDA approved for NASAL specimens only), is one component of a comprehensive MRSA colonization surveillance program. It is not intended to diagnose MRSA infection nor to guide or monitor treatment for MRSA infections. Performed at [facility], [address], [place], [place] [address]          Radiology Studies: No results found.      Scheduled Meds: . amoxicillin-clavulanate  1 tablet Oral Q12H  . Chlorhexidine Gluconate Cloth  6 each Topical Daily  . dexamethasone  6 mg Oral Q breakfast  . enoxaparin (LOVENOX) injection  60 mg Subcutaneous QHS  . insulin aspart  0-20 Units Subcutaneous TID WC  . insulin aspart  0-5 Units Subcutaneous QHS  . insulin glargine  40 Units Subcutaneous q morning - 10a  . insulin glargine  40 Units Subcutaneous QHS  . multivitamin with minerals  1 tablet Oral Q breakfast  . vitamin C  500 mg Oral Daily  . zinc sulfate  220 mg Oral Daily   Continuous Infusions: . remdesivir 100 mg in NS 250 mL Stopped (02/11/19 2207)     LOS: 5 days    Time spent: 35 minutes    Berton Mount, MD  [facility]  Pager #: [phone] 7PM-7AM contact night coverage as above

## 2019-02-12 NOTE — Progress Notes (Signed)
NT entered pt's room at 0300 to obtain his CBG. Upon approach, pt tells NT that he fell while ambulating in the room in an attempt to go to the bathroom. This RN was then called to the room to assess pt. Pt again states that he was attempting to get to the bathroom and fell. He denies hitting his head. Pt did bite his bottom lip, and an abrasion is visible on his left inner bottom lip. No other injuries noted. VS obtained. AC, CN, and NP on call, Bodenheimer, made aware of pt's fall. No new orders at this time. Pt denies wanting this RN to call family at this time. Post-fall documentation completed per flowsheet. Pt made a high fall risk and educated on high fall interventions that will be put in place. Pt verbalizes understanding of education. Will continue to monitor closely.

## 2019-02-13 DIAGNOSIS — E1165 Type 2 diabetes mellitus with hyperglycemia: Secondary | ICD-10-CM

## 2019-02-13 LAB — GLUCOSE, CAPILLARY
Glucose-Capillary: 290 mg/dL — ABNORMAL HIGH (ref 70–99)
Glucose-Capillary: 177 mg/dL — ABNORMAL HIGH (ref 70–99)
Glucose-Capillary: 192 mg/dL — ABNORMAL HIGH (ref 70–99)
Glucose-Capillary: 305 mg/dL — ABNORMAL HIGH (ref 70–99)
Glucose-Capillary: 321 mg/dL — ABNORMAL HIGH (ref 70–99)

## 2019-02-13 MED ORDER — ENOXAPARIN SODIUM 60 MG/0.6ML ~~LOC~~ SOLN
60.0000 mg | Freq: Two times a day (BID) | SUBCUTANEOUS | Status: DC
  Administered 2019-02-13 – 2019-02-14 (×2): 60 mg via SUBCUTANEOUS
  Filled 2019-02-13 (×2): qty 0.6

## 2019-02-13 NOTE — Progress Notes (Addendum)
Mother updated. Aunt notified that only 1 person updated per shift and family can coordinate between each other for further updates.

## 2019-02-13 NOTE — Progress Notes (Signed)
[facility] here to transport pt to [place]. All belongings and medications sent with pt and [facility]. Pt's aunt Oris Drone made aware of pts transfer and room number.

## 2019-02-13 NOTE — Progress Notes (Signed)
PROGRESS NOTE  [name] ZOX:[id number] DOB: 03-29-1986 DOA: 02/07/2019  PCP: [name], MD  Brief History/Interval Summary: 33 year old African-American male with a past medical history of diabetes mellitus type 2 who was recently hospitalized for sepsis due to osteomyelitis of the left foot.  He was placed on antibiotics.  He was found to have COVID-19 infection during his previous hospitalization.  At that time he refused remdesivir.  He presented with worsening shortness of breath.  He was hospitalized for further management.  He was noted to be hypoxic.  Reason for Visit: Acute respiratory failure with hypoxia.  Pneumonia due to COVID-19.  Consultants: None  Procedures: None  Antibiotics: Anti-infectives (From admission, onward)   Start     Dose/Rate Route Frequency Ordered Stop   02/09/19 1200  amoxicillin-clavulanate (AUGMENTIN) 875-125 MG per tablet 1 tablet     1 tablet Oral Every 12 hours 02/09/19 1051 02/19/19 0959   02/08/19 2200  remdesivir 100 mg in sodium chloride 0.9 % 250 mL IVPB     100 mg 500 mL/hr over 30 Minutes Intravenous Every 24 hours 02/07/19 2355 02/12/19 2159   02/08/19 1800  vancomycin (VANCOCIN) 1,500 mg in sodium chloride 0.9 % 500 mL IVPB  Status:  Discontinued     1,500 mg 250 mL/hr over 120 Minutes Intravenous Every 12 hours 02/08/19 0536 02/09/19 1051   02/08/19 1400  piperacillin-tazobactam (ZOSYN) IVPB 3.375 g  Status:  Discontinued     3.375 g 12.5 mL/hr over 240 Minutes Intravenous Every 8 hours 02/08/19 0536 02/09/19 1051   02/08/19 0345  piperacillin-tazobactam (ZOSYN) IVPB 3.375 g     3.375 g 100 mL/hr over 30 Minutes Intravenous  Once 02/08/19 0338 02/08/19 0642   02/08/19 0345  vancomycin (VANCOCIN) 2,000 mg in sodium chloride 0.9 % 500 mL IVPB     2,000 mg 250 mL/hr over 120 Minutes Intravenous  Once 02/08/19 0338 02/08/19 0753   02/08/19 0000  remdesivir 200 mg in sodium chloride 0.9 % 250 mL IVPB     200 mg 500 mL/hr over  30 Minutes Intravenous Once 02/07/19 2355 02/08/19 0111      Subjective/Interval History: Patient denies any pain in the foot.  He does have some shortness of breath.  Occasional dry cough.  Denies any chest pain.  No nausea vomiting.    Assessment/Plan:  Acute Hypoxic Resp. Failure/Pneumonia due to COVID-19  COVID-19 Labs  Recent Labs    02/11/19 0335 02/12/19 0347 02/12/19 1240  DDIMER  --   --  5.67*  FERRITIN 492* 376*  --   CRP 1.4* 1.3*  --     Lab Results  Component Value Date   SARSCOV2NAA POSITIVE (A) 02/03/2019   SARSCOV2NAA DETECTED (A) 02/03/2019     Fever: Low-grade fever noted. Oxygen requirements: Nasal cannula 4 L/min.  Saturating in the late 80s to early 90s. Antibacterials: Currently on Augmentin for osteomyelitis Remdesivir: Completed course Steroids: Dexamethasone 6 mg daily Diuretics: Not on scheduled diuretics Actemra: Not given Vitamin C and Zinc: Continue DVT Prophylaxis:  Lovenox   Patient's respiratory status is tenuous but stable.  He still requiring 4 L of oxygen by nasal cannula.  CT scan did not show any PE.  Continue with steroids.  He has completed course of remdesivir.  Cannot get Actemra due to recent active infection in his left foot.  It does not appear like he has been offered convalescent plasma either.  His CRP was 1.3 yesterday.  D-dimer 5.67 yesterday.  We  will recheck these labs tomorrow.  Depending on how he does over the next 24 hours may need to offer him enrollment into the convalescent plasma research study.  Continue incentive spirometry, mobilization and prone positioning as much as possible.  Recent diagnosis of left foot ulceration with osteomyelitis Patient was on vancomycin and Zosyn.  This has been changed over to Augmentin which is to be continued.  Local wound care.  Diabetes mellitus type 2 with hyperglycemia, uncontrolled Elevated CBGs due to steroids.  Currently on Lantus insulin as well as SSI.  Continue to  monitor.  May need to adjust dose of Lantus.  HbA1c 9.9 on November 1.  Obesity Estimated body mass index is 31.24 kg/m as calculated from the following:   Height as of this encounter:  (1.956 m).   Weight as of this encounter: 119.5 kg.   DVT Prophylaxis: Lovenox PUD Prophylaxis: Initiate Pepcid Code Status: Full code Family Communication: We will discuss with his family later today Disposition Plan: Management as outlined above.  Mobilize.   Medications:  Scheduled:  amoxicillin-clavulanate  1 tablet Oral Q12H   Chlorhexidine Gluconate Cloth  6 each Topical Daily   dexamethasone  6 mg Oral Q breakfast   enoxaparin (LOVENOX) injection  60 mg Subcutaneous Q12H   insulin aspart  0-20 Units Subcutaneous TID WC   insulin aspart  0-5 Units Subcutaneous QHS   insulin glargine  40 Units Subcutaneous q morning - 10a   insulin glargine  40 Units Subcutaneous QHS   multivitamin with minerals  1 tablet Oral Q breakfast   vitamin C  500 mg Oral Daily   zinc sulfate  220 mg Oral Daily   Continuous:  BJY:[id number] **OR** acetaminophen, acetaminophen, ondansetron **OR** ondansetron (ZOFRAN) IV, oxyCODONE   Objective:  Vital Signs  Vitals:   02/13/19 0500 02/13/19 0600 02/13/19 0748 02/13/19 1147  BP:   133/82 116/65  Pulse:  83 88 93  Resp: Temp:    99 F (37.2 C)  TempSrc:   Oral Oral  SpO2: 93% 96% 91% 93%  Weight:      Height:        Intake/Output Summary (Last 24 hours) at 02/13/2019 1415 Last data filed at 02/13/2019 0400 Gross per 24 hour  Intake 720 ml  Output 0 ml  Net 720 ml   Filed Weights   02/07/19 1646 02/08/19 0500  Weight: 122.3 kg 119.5 kg    General appearance: Awake alert.  In no distress Resp: Mildly tachypneic at rest.  No use of accessory muscles.  Coarse breath sounds bilaterally.  Crackles bilateral bases. Cardio: S1-S2 is normal regular.  No S3-S4.  No rubs murmurs or bruit GI: Abdomen is soft.  Nontender  nondistended.  Bowel sounds are present normal.  No masses organomegaly Extremities: Left foot covered in dressing.  Shallow ulcer noted at the plantar aspect below the first toe.  No active drainage noted.  No foul smell appreciated. Neurologic: Alert and oriented x3.  No focal neurological deficits.    Lab Results:  Data Reviewed: I have personally reviewed following labs and imaging studies  CBC: Recent Labs  Lab 02/07/19 2350 02/08/19 0612 02/10/19 0420 02/11/19 0335 02/12/19 0347 02/12/19 1240  WBC 9.1 10.4 7.2 7.4 7.0 8.8  NEUTROABS 7.6  --  5.8 5.0 4.0 6.1  HGB 13.4 13.4 12.7* 13.7 14.4 13.3  HCT 42.1 42.4 40.7 44.8 46.1 42.8  MCV 85.6 85.8 85.7 86.5 85.5 86.8  PLT  164 168 324 366 328 322    Basic Metabolic Panel: Recent Labs  Lab 02/07/19 2350  02/09/19 0253 02/10/19 0420 02/11/19 0335 02/12/19 0347 02/12/19 1240  NA 127*  --   --  131* 136 139 137  K 5.0  --   --  4.9 4.1 3.6 3.6  CL 92*  --   --  94* 97* 99 99  CO2 25  --   --  26 27 27 29   GLUCOSE 384*  --   --  516* 273* 103* 149*  BUN 12  --   --  21* 19 19 18   CREATININE 1.08   < > 1.06 0.95 0.92 0.95 1.02  CALCIUM 8.1*  --   --  8.8* 9.4 9.3 8.6*   < > = values in this interval not displayed.    GFR: Estimated Creatinine Clearance: 149 mL/min (by C-G formula based on SCr of 1.02 mg/dL).  Liver Function Tests: Recent Labs  Lab 02/07/19 2350 02/10/19 0420 02/11/19 0335 02/12/19 0347 02/12/19 1240  AST 56* 31 96* 56* 52*  ALT 29 28 64* 61* 55*  ALKPHOS 62 67 64 64 61  BILITOT 1.1 0.8 0.3 0.7 0.9  PROT 6.9 6.8 7.3 7.5 6.8  ALBUMIN 2.8* 2.7* 2.9* 3.2* 3.0*    CBG: Recent Labs  Lab 02/12/19 1659 02/12/19 2116 02/13/19 0244 02/13/19 0749 02/13/19 1146  GLUCAP 128* 295* 290* 177* 192*    Anemia Panel: Recent Labs    02/11/19 0335 02/12/19 0347  FERRITIN 492* 376*    Recent Results (from the past 240 hour(s))  SARS CORONAVIRUS 2 (TAT 6-24 HRS) Nasopharyngeal Nasopharyngeal  Swab     Status: Abnormal   Collection Time: 02/03/19  3:22 PM   Specimen: Nasopharyngeal Swab  Result Value Ref Range Status   SARS Coronavirus 2 POSITIVE (A) NEGATIVE Final    Comment: RESULT CALLED TO, READ BACK BY AND VERIFIED WITH: B.LITTLE RN 1958 02/04/2019 MCCORMICK K (NOTE) SARS-CoV-2 target nucleic acids are DETECTED. The SARS-CoV-2 RNA is generally detectable in upper and lower respiratory specimens during the acute phase of infection. Positive results are indicative of active infection with SARS-CoV-2. Clinical  correlation with patient history and other diagnostic information is necessary to determine patient infection status. Positive results do  not rule out bacterial infection or co-infection with other viruses. The expected result is Negative. Fact Sheet for Patients: 13/01/20 Fact Sheet for Healthcare Providers: 13/05/2018 This test is not yet approved or cleared by the [place] FDA and  has been authorized for detection and/or diagnosis of SARS-CoV-2 by FDA under an Emergency Use Authorization (EUA). This EUA will remain  in effect (meaning this test can be used) f or the duration of the COVID-19 declaration under Section 564(b)(1) of the Act, 21 U.S.C. section 360bbb-3(b)(1), unless the authorization is terminated or revoked sooner. Performed at [facility], [address], [place], [address] [place]   Blood Culture (routine x 2)     Status: None   Collection Time: 02/03/19  3:53 PM   Specimen: BLOOD  Result Value Ref Range Status   Specimen Description BLOOD LEFT ANTECUBITAL  Final   Special Requests   Final    BOTTLES DRAWN AEROBIC AND ANAEROBIC Blood Culture adequate volume   Culture   Final    NO GROWTH 5 DAYS Performed at [facility], [address], [place], [address] [place]    Report Status 02/08/2019 FINAL  Final  Blood Culture (  routine x 2)     Status: None    Collection Time: 02/03/19  4:11 PM   Specimen: BLOOD  Result Value Ref Range Status   Specimen Description BLOOD RIGHT ANTECUBITAL  Final   Special Requests   Final    BOTTLES DRAWN AEROBIC AND ANAEROBIC Blood Culture adequate volume   Culture   Final    NO GROWTH 5 DAYS Performed at [facility], [place] [address] [place], [place] [address]    Report Status 02/08/2019 FINAL  Final  Urine culture     Status: None   Collection Time: 02/04/19  6:03 AM   Specimen: Urine, Random  Result Value Ref Range Status   Specimen Description URINE, RANDOM  Final   Special Requests NONE  Final   Culture   Final    NO GROWTH Performed at [facility], [place] [address] [place], [place] [address]    Report Status 02/05/2019 FINAL  Final  Aerobic Culture (superficial specimen)     Status: None   Collection Time: 02/04/19  8:28 AM   Specimen: Foot; Wound  Result Value Ref Range Status   Specimen Description FOOT LEFT  Final   Special Requests SWAB SPEC  Final   Gram Stain   Final    FEW WBC PRESENT,BOTH PMN AND MONONUCLEAR RARE GRAM POSITIVE COCCI IN PAIRS Performed at [facility], [place] [address] [place], [place] [address]    Culture   Final    FEW SERRATIA FONTICOLA FEW STAPHYLOCOCCUS LUGDUNENSIS    Report Status 02/08/2019 FINAL  Final   Organism ID, Bacteria SERRATIA FONTICOLA  Final   Organism ID, Bacteria STAPHYLOCOCCUS LUGDUNENSIS  Final      Susceptibility   Serratia fonticola - MIC*    CEFAZOLIN >=64 RESISTANT Resistant     CEFEPIME <=1 SENSITIVE Sensitive     CEFTAZIDIME <=1 SENSITIVE Sensitive     CEFTRIAXONE <=1 SENSITIVE Sensitive     CIPROFLOXACIN <=0.25 SENSITIVE Sensitive     GENTAMICIN 4 SENSITIVE Sensitive     IMIPENEM 2 SENSITIVE Sensitive     TRIMETH/SULFA <=20 SENSITIVE Sensitive     PIP/TAZO <=4 SENSITIVE Sensitive     * FEW SERRATIA FONTICOLA   Staphylococcus lugdunensis - MIC*    CIPROFLOXACIN <=0.5 SENSITIVE Sensitive     ERYTHROMYCIN >=8  RESISTANT Resistant     GENTAMICIN <=0.5 SENSITIVE Sensitive     OXACILLIN 2 SENSITIVE Sensitive     TETRACYCLINE <=1 SENSITIVE Sensitive     VANCOMYCIN <=0.5 SENSITIVE Sensitive     TRIMETH/SULFA <=10 SENSITIVE Sensitive     CLINDAMYCIN >=8 RESISTANT Resistant     RIFAMPIN <=0.5 SENSITIVE Sensitive     Inducible Clindamycin NEGATIVE Sensitive     * FEW STAPHYLOCOCCUS LUGDUNENSIS  Blood Culture (routine x 2)     Status: None   Collection Time: 02/07/19  6:07 PM   Specimen: BLOOD  Result Value Ref Range Status   Specimen Description   Final    BLOOD BLOOD RIGHT FOREARM Performed at [place] [address], [place], [place] [address]    Special Requests   Final    BOTTLES DRAWN AEROBIC AND ANAEROBIC Blood Culture adequate volume Performed at [place] [address] [place], [place] [address]    Culture   Final    NO GROWTH 5 DAYS Performed at [facility], [place] [address], [place], [place] [address]    Report Status 02/12/2019 FINAL  Final  Blood Culture (routine x 2)     Status: None   Collection Time: 02/07/19  6:07 PM   Specimen: BLOOD  Result Value Ref Range Status   Specimen Description   Final    BLOOD BLOOD LEFT FOREARM Performed at [name], [address] [place], [place] [address]    Special Requests   Final    BOTTLES DRAWN AEROBIC AND ANAEROBIC Blood Culture results may not be optimal due to an inadequate volume of blood received in culture bottles Performed at [facility], [address] [place], [place] [address]    Culture   Final    NO GROWTH 5 DAYS Performed at [facility], [address] [place], [place] [address]    Report Status 02/12/2019 FINAL  Final  MRSA PCR Screening     Status: None   Collection Time: 02/08/19  6:12 AM   Specimen: Nasal Mucosa; Nasopharyngeal  Result Value Ref Range Status   MRSA by PCR NEGATIVE NEGATIVE Final    Comment:         The GeneXpert MRSA Assay (FDA approved for NASAL specimens only), is one component of a comprehensive MRSA colonization surveillance program. It is not intended to diagnose MRSA infection nor to guide or monitor treatment for MRSA infections. Performed at [facility], [address] [place], [place] [address]       Radiology Studies: Ct Angio Chest Pe W Or Wo Contrast  Result Date: 02/12/2019 CLINICAL DATA:  Shortness of breath. EXAM: CT ANGIOGRAPHY CHEST WITH CONTRAST TECHNIQUE: Multidetector CT imaging of the chest was performed using the standard protocol during bolus administration of intravenous contrast. Multiplanar CT image reconstructions and MIPs were obtained to evaluate the vascular anatomy. CONTRAST:  100mL OMNIPAQUE IOHEXOL 350 MG/ML SOLN COMPARISON:  CT chest dated June 30, 2017. FINDINGS: Cardiovascular: Evaluation for pulmonary emboli is severely limited by extensive respiratory motion artifact and suboptimal contrast bolus timing. There is no large centrally located pulmonary embolus. Detection of subsegmental and segmental pulmonary emboli is essentially nondiagnostic.The heart size is mildly enlarged. There is no significant pericardial effusion. No significant atherosclerotic changes of the thoracic aorta. No aneurysm of the thoracic aorta. Mediastinum/Nodes: --there are enlarged mediastinal --there is no significant axillary adenopathy. --No supraclavicular lymphadenopathy. --Normal thyroid gland. --The esophagus is unremarkable Lungs/Pleura: There are extensive ground-glass airspace opacities bilaterally with more focal areas of consolidation at the lung bases. There is no significant pleural effusion. The trachea is unremarkable. Upper Abdomen: No acute abnormality. Musculoskeletal: No chest wall abnormality. No acute or significant osseous findings. Review of the MIP images confirms the above findings. IMPRESSION: 1. Very limited study for the  detection of pulmonary emboli. There is no large centrally located pulmonary embolus. Detection of smaller lobar, segmental, and subsegmental pulmonary emboli is severely limited. 2. Extensive ground-glass bilateral airspace opacities consistent with the patient's history of viral pneumonia. There are more focal areas of consolidation at the lung bases bilaterally. 3. Mediastinal and hilar adenopathy, presumably reactive. Electronically Signed   By: Katherine Mantlehristopher  Green M.D.   On: 02/12/2019 21:56       LOS: 6 days   Lilyana Lippman Rito EhrlichKrishnan  [facility] Pager on [email or web address]  02/13/2019, 2:15 PM

## 2019-02-13 NOTE — Progress Notes (Signed)
Notified the Baldo Daub, aunt educated her on the importance the pt must keep oxygen and cardiac monitor. She expressed, "KEEP HIM ANOTHER BED OR MOVE HIM." Writer apologize to the aunt later on tonight we will find the pt another bed, but the importance is getting the family to please encourage him to keep the monitor and oxygen on.   CN will assist in finding the patient a bed.   Patient and family deficit of knowledge r/t the virus and his status.

## 2019-02-13 NOTE — Progress Notes (Signed)
ANTICOAGULATION CONSULT NOTE - Initial Consult  Pharmacy Consult for Lovenox Indication: VTE prophylaxis  Allergies  Allergen Reactions  . Basaglar Stephanie Coup [Insulin Glargine] Diarrhea  . Metformin And Related Diarrhea and Nausea And Vomiting  . Trulicity [Dulaglutide] Diarrhea    Patient Measurements: Height: 6\' 5"  (195.6 cm) Weight: 263 lb 7.2 oz (119.5 kg) IBW/kg (Calculated) : 89.1  Vital Signs: Temp: 99 F (37.2 C) (11/11 1147) Temp Source: Oral (11/11 1147) BP: 116/65 (11/11 1147) Pulse Rate: 93 (11/11 1147)  Labs: Recent Labs    02/11/19 0335 02/12/19 0347 02/12/19 1240  HGB 13.7 14.4 13.3  HCT 44.8 46.1 42.8  PLT 366 328 322  CREATININE 0.92 0.95 1.02  TROPONINIHS  --   --  8    Estimated Creatinine Clearance: 149 mL/min (by C-G formula based on SCr of 1.02 mg/dL).   Medical History: Past Medical History:  Diagnosis Date  . Diabetes mellitus     Medications:  Medications Prior to Admission  Medication Sig Dispense Refill Last Dose  . acetaminophen (TYLENOL) 500 MG tablet Take 500-1,000 mg by mouth every 6 (six) hours as needed for mild pain, fever or headache.    Past Month at Unknown time  . insulin aspart (NOVOLOG) 100 UNIT/ML injection Inject 0-15 Units into the skin 3 (three) times daily with meals. (Patient taking differently: Inject 7-12 Units into the skin 3 (three) times daily as needed (before meals, if BGL is 250 or greater). ) 10 mL 1 Past Month at Unknown time  . Insulin Glargine (BASAGLAR KWIKPEN) 100 UNIT/ML SOPN Inject 0.24 mLs (24 Units total) into the skin at bedtime. (Patient taking differently: Inject 40 Units into the skin every morning. ) 1 pen 1 Past Week at Unknown time  . Insulin Glargine (LANTUS SOLOSTAR) 100 UNIT/ML Solostar Pen Inject 40 Units into the skin daily before breakfast.   Past Week at Unknown time  . Multiple Vitamins-Minerals (EMERGEN-C IMMUNE) PACK Take 1 packet by mouth daily as needed (for supplementation).   unk   . multivitamin (ONE-A-DAY MEN'S) TABS tablet Take 1 tablet by mouth daily with breakfast.   Past Week at Unknown time  . amoxicillin-clavulanate (AUGMENTIN) 875-125 MG tablet Take 1 tablet by mouth 2 (two) times daily for 14 days. 28 tablet 0 not started  . Ascorbic Acid (VITAMIN C) 1000 MG tablet Take 1 tablet (1,000 mg total) by mouth daily for 7 days. 7 tablet 0 not started  . Continuous Blood Gluc Sensor (FREESTYLE LIBRE 14 DAY SENSOR) MISC Inject 1 patch into the skin every 14 (fourteen) days.     . Continuous Blood Gluc Sensor MISC 1 each by Does not apply route as directed. Use as directed every 14 days. May dispense FreeStyle 13/10/20 or similar. (Patient not taking: Reported on 02/03/2019) 2 each 0   . oxyCODONE (OXY IR/ROXICODONE) 5 MG immediate release tablet Take 1 tablet (5 mg total) by mouth every 4 (four) hours as needed for severe pain. 5 tablet 0 not started  . Zinc Sulfate 220 (50 Zn) MG TABS Take 1 tablet (220 mg total) by mouth daily. 7 tablet 0 not started    Assessment: 33 YOM with COVID-19. He has been treated with full-dose Lovenox due to concern for PE. A CT angio this AM (limited study) did not find any large centrally located PE's but was inconclusive regarding smaller lobar or subsegmental PE. Per MD, transitioning patient to intermediate dose Lovenox for VTE prophylaxis given patient's D-dimer > 5.  H/H and Plt wnl. SCr wnl. Of note patient received last dose of Lovenox 120 mg this AM.  Goal of Therapy:  VTE prophylaxis Monitor platelets by anticoagulation protocol: Yes   Plan:  -Start Lovenox 0.5 mg/kg (60 mg) twice daily to start this evening.  -Monitor renal fx, CBC and s/s of bleeding   Albertina Parr, PharmD., BCPS Clinical Pharmacist Clinical phone for 02/13/19 until 5pm: [phone]

## 2019-02-14 LAB — GLUCOSE, CAPILLARY
Glucose-Capillary: 177 mg/dL — ABNORMAL HIGH (ref 70–99)
Glucose-Capillary: 124 mg/dL — ABNORMAL HIGH (ref 70–99)
Glucose-Capillary: 139 mg/dL — ABNORMAL HIGH (ref 70–99)

## 2019-02-14 LAB — COMPREHENSIVE METABOLIC PANEL
ALT: 53 U/L — ABNORMAL HIGH (ref 0–44)
AST: 40 U/L (ref 15–41)
Albumin: 2.7 g/dL — ABNORMAL LOW (ref 3.5–5.0)
Alkaline Phosphatase: 58 U/L (ref 38–126)
Anion gap: 10 (ref 5–15)
BUN: 14 mg/dL (ref 6–20)
CO2: 29 mmol/L (ref 22–32)
Calcium: 9 mg/dL (ref 8.9–10.3)
Chloride: 98 mmol/L (ref 98–111)
Creatinine, Ser: 0.81 mg/dL (ref 0.61–1.24)
GFR calc Af Amer: >60 mL/min (ref >60)
GFR calc non Af Amer: >60 mL/min (ref >60)
Glucose, Bld: 202 mg/dL — ABNORMAL HIGH (ref 70–99)
Potassium: 4 mmol/L (ref 3.5–5.1)
Sodium: 137 mmol/L (ref 135–145)
Total Bilirubin: 0.2 mg/dL — ABNORMAL LOW (ref 0.3–1.2)
Total Protein: 6.8 g/dL (ref 6.5–8.1)

## 2019-02-14 LAB — CBC
HCT: 41 % (ref 39.0–52.0)
Hemoglobin: 12.8 g/dL — ABNORMAL LOW (ref 13.0–17.0)
MCH: 26.8 pg (ref 26.0–34.0)
MCHC: 31.2 g/dL (ref 30.0–36.0)
MCV: 85.8 fL (ref 80.0–100.0)
Platelets: 363 10*3/uL (ref 150–400)
RBC: 4.78 MIL/uL (ref 4.22–5.81)
RDW: 12.6 % (ref 11.5–15.5)
WBC: 7.9 10*3/uL (ref 4.0–10.5)
nRBC: 0 % (ref 0.0–0.2)

## 2019-02-14 LAB — MAGNESIUM: Magnesium: 2 mg/dL (ref 1.7–2.4)

## 2019-02-14 LAB — C-REACTIVE PROTEIN: CRP: 4.7 mg/dL — ABNORMAL HIGH (ref <1.0)

## 2019-02-14 LAB — PROCALCITONIN: Procalcitonin: <0.1 ng/mL

## 2019-02-14 LAB — D-DIMER, QUANTITATIVE: D-Dimer, Quant: 1.75 ug/mL-FEU — ABNORMAL HIGH (ref 0.00–0.50)

## 2019-02-14 MED ORDER — DEXAMETHASONE 2 MG PO TABS: ORAL_TABLET | ORAL | 0 refills | Status: DC

## 2019-02-14 NOTE — Progress Notes (Signed)
Inpatient Diabetes Program Recommendations  AACE/ADA: New Consensus Statement on Inpatient Glycemic Control   Target Ranges:  Prepandial:   less than 140 mg/dL      Peak postprandial:   less than 180 mg/dL (1-2 hours)      Critically ill patients:  140 - 180 mg/dL   Results for [name], [name] (MRN [id number]) as of 02/14/2019 11:10  Ref. Range 02/13/2019 07:49 02/13/2019 11:46 02/13/2019 16:36 02/13/2019 21:44 02/14/2019 04:56 02/14/2019 09:20  Glucose-Capillary Latest Ref Range: 70 - 99 mg/dL 177 (H) 192 (H) 305 (H) 321 (H) 177 (H) 124 (H)   Review of Glycemic Control  Diabetes history: DM2 Outpatient Diabetes medications: Lantus 40 units QAM, Lantus 24 units QHS, Novolog 0-15 units TID with meals Current orders for Inpatient glycemic control: Lantus 40 units QAM, Lantus 40 units QHS, Novolog 0-20 units TID with meals, Novolog 0-5 units QHS; Decadron 6 mg QAM  Inpatient Diabetes Program Recommendations:   Insulin-Meal Coverage: If steroids are continued, please consider ordering Novolog 6 units TID with meals for meal coverage if patient eats at least 50% of meals.  Thanks, Barnie Alderman, RN, MSN, CDE Diabetes Coordinator Inpatient Diabetes Program [phone] (Team Pager from 8am to 5pm)

## 2019-02-14 NOTE — TOC Transition Note (Signed)
Transition of Care [facility]) - CM/SW Discharge Note   Patient Details  Name: [name] Ferreri MRN: [id number] Date of Birth: 05/18/1985  [facility] CM/SW Contact:  Weston Anna, LCSW Phone Number: 02/14/2019, 3:32 PM   Clinical Narrative:     Patient set to discharge home with home oxygen- [facility] accepted referral and will deliver o2 to home. CSW contacted [facility] to deliver portable tank to room before discharge. No other needs at this time. All orders placed by MD.   Final next level of care: Home/Self Care Barriers to Discharge: No Barriers Identified   Patient Goals and CMS Choice        Discharge Placement                       Discharge Plan and Services                DME Arranged: Oxygen DME Agency: [facility] Date DME Agency Contacted: 02/14/19 Time DME Agency Contacted: [phone] Representative spoke with at DME Agency: leslie            Social Determinants of Health ([place]) Interventions     Readmission Risk Interventions No flowsheet data found.

## 2019-02-14 NOTE — Progress Notes (Signed)
Pt was refusing to keep monitor on.  Pt states he wants a new bed. After his continuous refusal, Probation officer notified MD. MD noted to educate pt of his present condition and he encourage him keep monitor on.

## 2019-02-14 NOTE — Discharge Instructions (Signed)
COVID-19 COVID-19 is a respiratory infection that is caused by a virus called severe acute respiratory syndrome coronavirus 2 (SARS-CoV-2). The disease is also known as coronavirus disease or novel coronavirus. In some people, the virus may not cause any symptoms. In others, it may cause a serious infection. The infection can get worse quickly and can lead to complications, such as:  Pneumonia, or infection of the lungs.  Acute respiratory distress syndrome or ARDS. This is fluid build-up in the lungs.  Acute respiratory failure. This is a condition in which there is not enough oxygen passing from the lungs to the body.  Sepsis or septic shock. This is a serious bodily reaction to an infection.  Blood clotting problems.  Secondary infections due to bacteria or fungus. The virus that causes COVID-19 is contagious. This means that it can spread from person to person through droplets from coughs and sneezes (respiratory secretions). What are the causes? This illness is caused by a virus. You may catch the virus by:  Breathing in droplets from an infected person's cough or sneeze.  Touching something, like a table or a doorknob, that was exposed to the virus (contaminated) and then touching your mouth, nose, or eyes. What increases the risk? Risk for infection You are more likely to be infected with this virus if you:  Live in or travel to an area with a COVID-19 outbreak.  Come in contact with a sick person who recently traveled to an area with a COVID-19 outbreak.  Provide care for or live with a person who is infected with COVID-19. Risk for serious illness You are more likely to become seriously ill from the virus if you:  Are 65 years of age or older.  Have a long-term disease that lowers your body's ability to fight infection (immunocompromised).  Live in a nursing home or long-term care facility.  Have a long-term (chronic) disease such as: ? Chronic lung disease, including  chronic obstructive pulmonary disease or asthma ? Heart disease. ? Diabetes. ? Chronic kidney disease. ? Liver disease.  Are obese. What are the signs or symptoms? Symptoms of this condition can range from mild to severe. Symptoms may appear any time from 2 to 14 days after being exposed to the virus. They include:  A fever.  A cough.  Difficulty breathing.  Chills.  Muscle pains.  A sore throat.  Loss of taste or smell. Some people may also have stomach problems, such as nausea, vomiting, or diarrhea. Other people may not have any symptoms of COVID-19. How is this diagnosed? This condition may be diagnosed based on:  Your signs and symptoms, especially if: ? You live in an area with a COVID-19 outbreak. ? You recently traveled to or from an area where the virus is common. ? You provide care for or live with a person who was diagnosed with COVID-19.  A physical exam.  Lab tests, which may include: ? A nasal swab to take a sample of fluid from your nose. ? A throat swab to take a sample of fluid from your throat. ? A sample of mucus from your lungs (sputum). ? Blood tests.  Imaging tests, which may include, X-rays, CT scan, or ultrasound. How is this treated? At present, there is no medicine to treat COVID-19. Medicines that treat other diseases are being used on a trial basis to see if they are effective against COVID-19. Your health care provider will talk with you about ways to treat your symptoms. For most   people, the infection is mild and can be managed at home with rest, fluids, and over-the-counter medicines. Treatment for a serious infection usually takes places in a hospital intensive care unit (ICU). It may include one or more of the following treatments. These treatments are given until your symptoms improve.  Receiving fluids and medicines through an IV.  Supplemental oxygen. Extra oxygen is given through a tube in the nose, a face mask, or a  hood.  Positioning you to lie on your stomach (prone position). This makes it easier for oxygen to get into the lungs.  Continuous positive airway pressure (CPAP) or bi-level positive airway pressure (BPAP) machine. This treatment uses mild air pressure to keep the airways open. A tube that is connected to a motor delivers oxygen to the body.  Ventilator. This treatment moves air into and out of the lungs by using a tube that is placed in your windpipe.  Tracheostomy. This is a procedure to create a hole in the neck so that a breathing tube can be inserted.  Extracorporeal membrane oxygenation (ECMO). This procedure gives the lungs a chance to recover by taking over the functions of the heart and lungs. It supplies oxygen to the body and removes carbon dioxide. Follow these instructions at home: Lifestyle  If you are sick, stay home except to get medical care. Your health care provider will tell you how long to stay home. Call your health care provider before you go for medical care.  Rest at home as told by your health care provider.  Do not use any products that contain nicotine or tobacco, such as cigarettes, e-cigarettes, and chewing tobacco. If you need help quitting, ask your health care provider.  Return to your normal activities as told by your health care provider. Ask your health care provider what activities are safe for you. General instructions  Take over-the-counter and prescription medicines only as told by your health care provider.  Drink enough fluid to keep your urine pale yellow.  Keep all follow-up visits as told by your health care provider. This is important. How is this prevented?  There is no vaccine to help prevent COVID-19 infection. However, there are steps you can take to protect yourself and others from this virus. To protect yourself:   Do not travel to areas where COVID-19 is a risk. The areas where COVID-19 is reported change often. To identify  high-risk areas and travel restrictions, check the CDC travel website: wwwnc.cdc.gov/travel/notices  If you live in, or must travel to, an area where COVID-19 is a risk, take precautions to avoid infection. ? Stay away from people who are sick. ? Wash your hands often with soap and water for 20 seconds. If soap and water are not available, use an alcohol-based hand sanitizer. ? Avoid touching your mouth, face, eyes, or nose. ? Avoid going out in public, follow guidance from your state and local health authorities. ? If you must go out in public, wear a cloth face covering or face mask. ? Disinfect objects and surfaces that are frequently touched every day. This may include:  Counters and tables.  Doorknobs and light switches.  Sinks and faucets.  Electronics, such as phones, remote controls, keyboards, computers, and tablets. To protect others: If you have symptoms of COVID-19, take steps to prevent the virus from spreading to others.  If you think you have a COVID-19 infection, contact your health care provider right away. Tell your health care team that you think you may   have a COVID-19 infection.  Stay home. Leave your house only to seek medical care. Do not use public transport.  Do not travel while you are sick.  Wash your hands often with soap and water for 20 seconds. If soap and water are not available, use alcohol-based hand sanitizer.  Stay away from other members of your household. Let healthy household members care for children and pets, if possible. If you have to care for children or pets, wash your hands often and wear a mask. If possible, stay in your own room, separate from others. Use a different bathroom.  Make sure that all people in your household wash their hands well and often.  Cough or sneeze into a tissue or your sleeve or elbow. Do not cough or sneeze into your hand or into the air.  Wear a cloth face covering or face mask. Where to find more  information  Centers for Disease Control and Prevention: www.cdc.gov/coronavirus/2019-ncov/index.html  World Health Organization: www.who.int/health-topics/coronavirus Contact a health care provider if:  You live in or have traveled to an area where COVID-19 is a risk and you have symptoms of the infection.  You have had contact with someone who has COVID-19 and you have symptoms of the infection. Get help right away if:  You have trouble breathing.  You have pain or pressure in your chest.  You have confusion.  You have bluish lips and fingernails.  You have difficulty waking from sleep.  You have symptoms that get worse. These symptoms may represent a serious problem that is an emergency. Do not wait to see if the symptoms will go away. Get medical help right away. Call your local emergency services (911 in the [place] Do not drive yourself to the hospital. Let the emergency medical personnel know if you think you have COVID-19. Summary  COVID-19 is a respiratory infection that is caused by a virus. It is also known as coronavirus disease or novel coronavirus. It can cause serious infections, such as pneumonia, acute respiratory distress syndrome, acute respiratory failure, or sepsis.  The virus that causes COVID-19 is contagious. This means that it can spread from person to person through droplets from coughs and sneezes.  You are more likely to develop a serious illness if you are 65 years of age or older, have a weak immunity, live in a nursing home, or have chronic disease.  There is no medicine to treat COVID-19. Your health care provider will talk with you about ways to treat your symptoms.  Take steps to protect yourself and others from infection. Wash your hands often and disinfect objects and surfaces that are frequently touched every day. Stay away from people who are sick and wear a mask if you are sick. This information is not intended to replace advice given to you by  your health care provider. Make sure you discuss any questions you have with your health care provider. Document Released: 04/26/2018 Document Revised: 08/16/2018 Document Reviewed: 04/26/2018 [employer] Patient Education  2020 [employer]  

## 2019-02-14 NOTE — Discharge Summary (Signed)
[facility]  Physician Discharge Summary   Patient ID: [name] MRN: [id number] DOB/AGE: 05-21-1985 33 y.o.  Admit date: 02/07/2019 Discharge date: 02/14/2019  PCP: Renford DillsPolite, Ronald, MD  DISCHARGE DIAGNOSES:  Pneumonia due to COVID-19 Acute respiratory failure with hypoxia Left foot ulceration with osteomyelitis Diabetes mellitus type 2 with hyperglycemia, uncontrolled Obesity  RECOMMENDATIONS FOR OUTPATIENT FOLLOW UP: 1. Follow-up with podiatry 2. Patient being discharged with home oxygen   Home Health: None Equipment/Devices: Home oxygen  CODE STATUS: Full code  DISCHARGE CONDITION: fair  Diet recommendation: Modified carbohydrate  INITIAL HISTORY: [id number] year old African-American male with a past medical history of diabetes mellitus type 2 who was recently hospitalized for sepsis due to osteomyelitis of the left foot.  He was placed on antibiotics.  He was found to have COVID-19 infection during his previous hospitalization.  At that time he refused remdesivir.  He presented with worsening shortness of breath.  He was hospitalized for further management.  He was noted to be hypoxic.   HOSPITAL COURSE:    Acute Hypoxic Resp. Failure/Pneumonia due to COVID-19 Patient was hospitalized.  He was noted to have fever.  He was requiring oxygen at 4 L.  He was started on remdesivir and steroids.  Patient was [facility].  He slowly started feeling better.  He is now down to 2 L of oxygen saturating in the early 90s.  He has been ambulated.  He meets criteria for home oxygen.  This has been ordered.  He completed course of remdesivir.  He will be given tapering course of steroids.  Inflammatory markers have improved.  CT angiogram did not show any PE.  Recent diagnosis of left foot ulceration with osteomyelitis Patient was on vancomycin and Zosyn.  This has been changed over to Augmentin which is to be continued.  Local wound care.  Follow-up  with podiatry.  Diabetes mellitus type 2 with hyperglycemia, uncontrolled Elevated CBGs due to steroids.    HbA1c 9.9 when checked recently.  Continue with home medication regimen.    Obesity Estimated body mass index is 31.24 kg/m as calculated from the following:   Height as of this encounter: 6\' 5"  (1.956 m).   Weight as of this encounter: 119.5 kg.  Overall stable.  Okay for discharge home with home oxygen.   PERTINENT LABS:  The results of significant diagnostics from this hospitalization (including imaging, microbiology, ancillary and laboratory) are listed below for reference.    Microbiology: Recent Results (from the past 240 hour(s))  Blood Culture (routine x 2)     Status: None   Collection Time: 02/07/19  6:07 PM   Specimen: BLOOD  Result Value Ref Range Status   Specimen Description   Final    BLOOD BLOOD RIGHT FOREARM Performed at [facility], [address] [place], [place] [address]    Special Requests   Final    BOTTLES DRAWN AEROBIC AND ANAEROBIC Blood Culture adequate volume Performed at [facility], [address] [place], [place] [address]    Culture   Final    NO GROWTH 5 DAYS Performed at [facility], [address] [place], [place] [address]    Report Status 02/12/2019 FINAL  Final  Blood Culture (routine x 2)     Status: None   Collection Time: 02/07/19  6:07 PM   Specimen: BLOOD  Result Value Ref Range Status   Specimen Description   Final    BLOOD BLOOD LEFT  FOREARM Performed at [facility], [place] [address], [place], [place] [address]    Special Requests   Final    BOTTLES DRAWN AEROBIC AND ANAEROBIC Blood Culture results may not be optimal due to an inadequate volume of blood received in culture bottles Performed at [place] [address] [place], [place] [address]    Culture   Final    NO GROWTH 5 DAYS Performed at [facility], [place] [address], [place], [place] [address]    Report Status 02/12/2019 FINAL  Final  MRSA PCR Screening     Status: None   Collection Time: 02/08/19  6:12 AM   Specimen: Nasal Mucosa; Nasopharyngeal  Result Value Ref Range Status   MRSA by PCR NEGATIVE NEGATIVE Final    Comment:        The GeneXpert MRSA Assay (FDA approved for NASAL specimens only), is one component of a comprehensive MRSA colonization surveillance program. It is not intended to diagnose MRSA infection nor to guide or monitor treatment for MRSA infections. Performed at [facility], [place] [address], [place], [place] [address]      Labs:  COVID-19 Labs  Recent Labs    02/14/19 0425  DDIMER 1.75*  CRP 4.7*    Lab Results  Component Value Date   SARSCOV2NAA POSITIVE (A) 02/03/2019   SARSCOV2NAA DETECTED (A) 02/03/2019      Basic Metabolic Panel: Recent Labs  Lab 02/10/19 0420 02/11/19 0335 02/12/19 0347 02/12/19 1240 02/14/19 0425  NA 131* 136 139 137 137  K 4.9 4.1 3.6 3.6 4.0  CL 94* 97* 99 99 98  CO2 26 27 27 29 29   GLUCOSE 516* 273* 103* 149* 202*  BUN 21* 19 19 18 14   CREATININE 0.95 0.92 0.95 1.02 0.81  CALCIUM 8.8* 9.4 9.3 8.6* 9.0  MG  --   --   --   --  2.0   Liver Function Tests: Recent Labs  Lab 02/10/19 0420 02/11/19 0335 02/12/19 0347 02/12/19 1240 02/14/19 0425  AST 31 96* 56* 52* 40  ALT 28 64* 61* 55* 53*  ALKPHOS 67 64 64 61 58  BILITOT 0.8 0.3 0.7 0.9 0.2*  PROT 6.8 7.3 7.5 6.8 6.8  ALBUMIN 2.7* 2.9* 3.2* 3.0* 2.7*   CBC: Recent Labs  Lab 02/10/19 0420 02/11/19 0335 02/12/19 0347 02/12/19 1240 02/14/19 0425  WBC 7.2 7.4 7.0 8.8 7.9  NEUTROABS 5.8 5.0 4.0 6.1  --   HGB 12.7* 13.7 14.4 13.3 12.8*  HCT 40.7 44.8 46.1 42.8 41.0  MCV 85.7 86.5 85.5 86.8 85.8  PLT 324 366 328 322 363   BNP: BNP (last 3 results) Recent Labs    02/04/19 1630  BNP 6.8    CBG: Recent Labs  Lab 02/13/19 1636 02/13/19 2144 02/14/19 0456 02/14/19 0920  02/14/19 1217  GLUCAP 305* 321* 177* 124* 139*     IMAGING STUDIES Ct Angio Chest Pe W Or Wo Contrast  Result Date: 02/12/2019 CLINICAL DATA:  Shortness of breath. EXAM: CT ANGIOGRAPHY CHEST WITH CONTRAST TECHNIQUE: Multidetector CT imaging of the chest was performed using the standard protocol during bolus administration of intravenous contrast. Multiplanar CT image reconstructions and MIPs were obtained to evaluate the vascular anatomy. CONTRAST:  128mL OMNIPAQUE IOHEXOL 350 MG/ML SOLN COMPARISON:  CT chest dated June 30, 2017. FINDINGS: Cardiovascular: Evaluation for pulmonary emboli is severely limited by extensive respiratory motion artifact and suboptimal contrast bolus timing. There is no large  centrally located pulmonary embolus. Detection of subsegmental and segmental pulmonary emboli is essentially nondiagnostic.The heart size is mildly enlarged. There is no significant pericardial effusion. No significant atherosclerotic changes of the thoracic aorta. No aneurysm of the thoracic aorta. Mediastinum/Nodes: --there are enlarged mediastinal --there is no significant axillary adenopathy. --No supraclavicular lymphadenopathy. --Normal thyroid gland. --The esophagus is unremarkable Lungs/Pleura: There are extensive ground-glass airspace opacities bilaterally with more focal areas of consolidation at the lung bases. There is no significant pleural effusion. The trachea is unremarkable. Upper Abdomen: No acute abnormality. Musculoskeletal: No chest wall abnormality. No acute or significant osseous findings. Review of the MIP images confirms the above findings. IMPRESSION: 1. Very limited study for the detection of pulmonary emboli. There is no large centrally located pulmonary embolus. Detection of smaller lobar, segmental, and subsegmental pulmonary emboli is severely limited. 2. Extensive ground-glass bilateral airspace opacities consistent with the patient's history of viral pneumonia. There are more  focal areas of consolidation at the lung bases bilaterally. 3. Mediastinal and hilar adenopathy, presumably reactive. Electronically Signed   By: Katherine Mantle M.D.   On: 02/12/2019 21:56   Dg Chest Portable 1 View  Result Date: 02/07/2019 CLINICAL DATA:  Hypoxia, COVID-19, shortness of breath, fatigue, lays, hypoxemia EXAM: PORTABLE CHEST 1 VIEW COMPARISON:  Portable exam 1707 hours compared to 02/05/2019 FINDINGS: Borderline enlargement of cardiac silhouette. Stable mediastinal contours. Patchy airspace infiltrates bilaterally consistent with pneumonia and history of COVID, minimally increased in RIGHT upper lobe. No pleural effusion or pneumothorax. Bones unremarkable. IMPRESSION: BILATERAL pulmonary infiltrates consistent with pneumonia and history of COVID, slightly increased in RIGHT upper lobe. Electronically Signed   By: Ulyses Southward M.D.   On: 02/07/2019 17:28     DISCHARGE EXAMINATION: Vitals:   02/13/19 1540 02/13/19 1948 02/14/19 0915 02/14/19 1200  BP:  114/75 120/78 (!) 114/102  Pulse:  (!) 108 92 97  Resp:   16 20  Temp:  98.4 F (36.9 C) 98.7 F (37.1 C) 98.3 F (36.8 C)  TempSrc:  Oral Oral Oral  SpO2: 93% (!) 86% 90% 91%  Weight:      Height:       General appearance: Awake alert.  In no distress Resp: Coarse breath sound bilaterally.  Few crackles at the bases no wheezing or rhonchi. Cardio: S1-S2 is normal regular.  No S3-S4.  No rubs murmurs or bruit GI: Abdomen is soft.  Nontender nondistended.  Bowel sounds are present normal.  No masses organomegaly Left foot covered in dressing   DISPOSITION: Home  Discharge Instructions    MyChart COVID-19 home monitoring program   Complete by: Feb 14, 2019    Is the patient willing to use the MyChart Mobile App for home monitoring?: Yes   Temperature monitoring   Complete by: Feb 14, 2019    After how many days would you like to receive a notification of this patient's flowsheet entries?: 1   Call MD for:   difficulty breathing, headache or visual disturbances   Complete by: As directed    Call MD for:  extreme fatigue   Complete by: As directed    Call MD for:  persistant dizziness or light-headedness   Complete by: As directed    Call MD for:  persistant nausea and vomiting   Complete by: As directed    Call MD for:  severe uncontrolled pain   Complete by: As directed    Call MD for:  temperature >100.4   Complete by: As directed  Diet Carb Modified   Complete by: As directed    Discharge instructions   Complete by: As directed    Please be sure to follow-up with your podiatrist for your foot ulcer.  Take your medications as prescribed.  COVID 19 INSTRUCTIONS  - You are felt to be stable enough to no longer require inpatient monitoring, testing, and treatment, though you will need to follow the recommendations below: - Based on the CDC's non-test criteria for ending self-isolation: You may not return to work/leave the home until at least 21 days since symptom onset AND 3 days without a fever (without taking tylenol, ibuprofen, etc.) AND have improvement in respiratory symptoms. - Do not take NSAID medications (including, but not limited to, ibuprofen, advil, motrin, naproxen, aleve, goody's powder, etc.) - Follow up with your doctor in the next week via telehealth or seek medical attention right away if your symptoms get WORSE.  - Consider donating plasma after you have recovered (either 14 days after a negative test or 28 days after symptoms have completely resolved) because your antibodies to this virus may be helpful to give to others with life-threatening infections. Please go to the website www.[email or web address] if you would like to consider volunteering for plasma donation.    Directions for you at home:  Wear a facemask You should wear a facemask that covers your nose and mouth when you are in the same room with other people and when you visit a healthcare provider. People who live  with or visit you should also wear a facemask while they are in the same room with you.  Separate yourself from other people in your home As much as possible, you should stay in a different room from other people in your home. Also, you should use a separate bathroom, if available.  Avoid sharing household items You should not share dishes, drinking glasses, cups, eating utensils, towels, bedding, or other items with other people in your home. After using these items, you should wash them thoroughly with soap and water.  Cover your coughs and sneezes Cover your mouth and nose with a tissue when you cough or sneeze, or you can cough or sneeze into your sleeve. Throw used tissues in a lined trash can, and immediately wash your hands with soap and water for at least 20 seconds or use an alcohol-based hand rub.  Wash your [employer] your hands often and thoroughly with soap and water for at least 20 seconds. You can use an alcohol-based hand sanitizer if soap and water are not available and if your hands are not visibly dirty. Avoid touching your eyes, nose, and mouth with unwashed hands.  Directions for those who live with, or provide care at home for you:  Limit the number of people who have contact with the patient If possible, have only one caregiver for the patient. Other household members should stay in another home or place of residence. If this is not possible, they should stay in another room, or be separated from the patient as much as possible. Use a separate bathroom, if available. Restrict visitors who do not have an essential need to be in the home.  Ensure good ventilation Make sure that shared spaces in the home have good air flow, such as from an air conditioner or an opened window, weather permitting.  Wash your hands often Wash your hands often and thoroughly with soap and water for at least 20 seconds. You can use an alcohol based hand sanitizer  if soap and water are  not available and if your hands are not visibly dirty. Avoid touching your eyes, nose, and mouth with unwashed hands. Use disposable paper towels to dry your hands. If not available, use dedicated cloth towels and replace them when they become wet.  Wear a facemask and gloves Wear a disposable facemask at all times in the room and gloves when you touch or have contact with the patients blood, body fluids, and/or secretions or excretions, such as sweat, saliva, sputum, nasal mucus, vomit, urine, or feces.  Ensure the mask fits over your nose and mouth tightly, and do not touch it during use. Throw out disposable facemasks and gloves after using them. Do not reuse. Wash your hands immediately after removing your facemask and gloves. If your personal clothing becomes contaminated, carefully remove clothing and launder. Wash your hands after handling contaminated clothing. Place all used disposable facemasks, gloves, and other waste in a lined container before disposing them with other household waste. Remove gloves and wash your hands immediately after handling these items.  Do not share dishes, glasses, or other household items with the patient Avoid sharing household items. You should not share dishes, drinking glasses, cups, eating utensils, towels, bedding, or other items with a patient who is confirmed to have, or being evaluated for, COVID-19 infection. After the person uses these items, you should wash them thoroughly with soap and water.  Wash laundry thoroughly Immediately remove and wash clothes or bedding that have blood, body fluids, and/or secretions or excretions, such as sweat, saliva, sputum, nasal mucus, vomit, urine, or feces, on them. Wear gloves when handling laundry from the patient. Read and follow directions on labels of laundry or clothing items and detergent. In general, wash and dry with the warmest temperatures recommended on the label.  Clean all areas the individual  has used often Clean all touchable surfaces, such as counters, tabletops, doorknobs, bathroom fixtures, toilets, phones, keyboards, tablets, and bedside tables, every day. Also, clean any surfaces that may have blood, body fluids, and/or secretions or excretions on them. Wear gloves when cleaning surfaces the patient has come in contact with. Use a diluted bleach solution (e.g., dilute bleach with 1 part bleach and 10 parts water) or a household disinfectant with a label that says EPA-registered for coronaviruses. To make a bleach solution at home, add 1 tablespoon of bleach to 1 quart (4 cups) of water. For a larger supply, add  cup of bleach to 1 gallon (16 cups) of water. Read labels of cleaning products and follow recommendations provided on product labels. Labels contain instructions for safe and effective use of the cleaning product including precautions you should take when applying the product, such as wearing gloves or eye protection and making sure you have good ventilation during use of the product. Remove gloves and wash hands immediately after cleaning.  Monitor yourself for signs and symptoms of illness Caregivers and household members are considered close contacts, should monitor their health, and will be asked to limit movement outside of the home to the extent possible. Follow the monitoring steps for close contacts listed on the symptom monitoring form.   If you have additional questions, contact your local health department or call the epidemiologist on call at [phone] (available 24/7). This guidance is subject to change. For the most up-to-date guidance from [facility], please refer to their website: TripMetro.hu   You were cared for by a hospitalist during your hospital stay. If you have any questions about  your discharge medications or the care you received while you were in the hospital after you are discharged, you  can call the unit and asked to speak with the hospitalist on call if the hospitalist that took care of you is not available. Once you are discharged, your primary care physician will handle any further medical issues. Please note that NO REFILLS for any discharge medications will be authorized once you are discharged, as it is imperative that you return to your primary care physician (or establish a relationship with a primary care physician if you do not have one) for your aftercare needs so that they can reassess your need for medications and monitor your lab values. If you do not have a primary care physician, you can call [phone] for a physician referral.   Increase activity slowly   Complete by: As directed         Allergies as of 02/14/2019      Reactions   Basaglar Stephanie Coup [insulin Glargine] Diarrhea   Metformin And Related Diarrhea, Nausea And Vomiting   Trulicity [dulaglutide] Diarrhea      Medication List    STOP taking these medications   vitamin C 1000 MG tablet     TAKE these medications   acetaminophen 500 MG tablet Commonly known as: TYLENOL Take 500-1,000 mg by mouth every 6 (six) hours as needed for mild pain, fever or headache.   amoxicillin-clavulanate 875-125 MG tablet Commonly known as: Augmentin Take 1 tablet by mouth 2 (two) times daily for 14 days.   Lantus SoloStar 100 UNIT/ML Solostar Pen Generic drug: Insulin Glargine Inject 40 Units into the skin daily before breakfast. What changed: Another medication with the same name was changed. Make sure you understand how and when to take each.   Basaglar KwikPen 100 UNIT/ML Sopn Inject 0.24 mLs (24 Units total) into the skin at bedtime. What changed:   how much to take  when to take this   FreeStyle Libre 14 Day Sensor Misc Inject 1 patch into the skin every 14 (fourteen) days.   Continuous Blood Gluc Sensor Misc 1 each by Does not apply route as directed. Use as directed every 14 days. May dispense  FreeStyle [employer] or similar.   dexamethasone 2 MG tablet Commonly known as: DECADRON Take 2 tablets once daily for 3 days, then 1 tablet once daily for 3 days, then STOP.   Emergen-C Immune Pack Take 1 packet by mouth daily as needed (for supplementation).   insulin aspart 100 UNIT/ML injection Commonly known as: novoLOG Inject 0-15 Units into the skin 3 (three) times daily with meals. What changed:   how much to take  when to take this  reasons to take this   multivitamin Tabs tablet Take 1 tablet by mouth daily with breakfast.   oxyCODONE 5 MG immediate release tablet Commonly known as: Oxy IR/ROXICODONE Take 1 tablet (5 mg total) by mouth every 4 (four) hours as needed for severe pain.   Zinc Sulfate 220 (50 Zn) MG Tabs Take 1 tablet (220 mg total) by mouth daily.        Follow-up Information    [name], MD. Schedule an appointment as soon as possible for a visit in 1 week(s).   Specialty: Internal Medicine Contact information: [address] [place] [address] [phone]           TOTAL DISCHARGE TIME: 35 minutes  Janele Lague Rito Ehrlich  [facility] Pager on [email or web address]  02/15/2019,  12:59 PM

## 2019-02-14 NOTE — Progress Notes (Signed)
Pt has another bed. Informed pt's aunt with updates. Ms. Nicole Kindred she had his sisters to call several times to report he did not have a working bed and he had been waiting since 3pm. Again the Probation officer apologize, informed the aunt we just do not have extra beds at our hand. Ms. Nicole Kindred, also noted why the pt could not order food for delivery. Writer educated Ms. Nicole Kindred, the risks and the policy.

## 2019-02-14 NOTE — Plan of Care (Signed)

## 2019-02-14 NOTE — Progress Notes (Signed)
SATURATION QUALIFICATIONS: (This note is used to comply with regulatory documentation for home oxygen)  Patient Saturations on Room Air at Rest = 91%  Patient Saturations on Room Air while Ambulating = 84%  Patient Saturations on 2Liters of oxygen while Ambulating = 91%  Please briefly explain why patient needs home oxygen: Desaturation while ambulation.

## 2019-02-26 ENCOUNTER — Encounter (HOSPITAL_BASED_OUTPATIENT_CLINIC_OR_DEPARTMENT_OTHER): Payer: BC Managed Care – PPO | Attending: Physician Assistant | Admitting: Physician Assistant

## 2019-02-26 ENCOUNTER — Other Ambulatory Visit: Payer: Self-pay

## 2019-02-26 DIAGNOSIS — E1142 Type 2 diabetes mellitus with diabetic polyneuropathy: Secondary | ICD-10-CM | POA: Insufficient documentation

## 2019-02-26 DIAGNOSIS — E11621 Type 2 diabetes mellitus with foot ulcer: Secondary | ICD-10-CM | POA: Diagnosis not present

## 2019-02-26 DIAGNOSIS — L97522 Non-pressure chronic ulcer of other part of left foot with fat layer exposed: Secondary | ICD-10-CM | POA: Diagnosis not present

## 2019-02-26 DIAGNOSIS — Z8619 Personal history of other infectious and parasitic diseases: Secondary | ICD-10-CM | POA: Insufficient documentation

## 2019-03-04 ENCOUNTER — Other Ambulatory Visit: Payer: Self-pay

## 2019-03-04 ENCOUNTER — Ambulatory Visit (HOSPITAL_COMMUNITY)
Admission: RE | Admit: 2019-03-04 | Discharge: 2019-03-04 | Disposition: A | Discharge status: Home / Self Care | Payer: BC Managed Care – PPO | Source: Ambulatory Visit | Attending: Physician Assistant | Admitting: Physician Assistant

## 2019-03-04 ENCOUNTER — Other Ambulatory Visit (HOSPITAL_COMMUNITY): Payer: Self-pay | Admitting: Physician Assistant

## 2019-03-04 DIAGNOSIS — Z9289 Personal history of other medical treatment: Secondary | ICD-10-CM

## 2019-03-05 ENCOUNTER — Encounter (HOSPITAL_BASED_OUTPATIENT_CLINIC_OR_DEPARTMENT_OTHER): Payer: BC Managed Care – PPO | Attending: Internal Medicine | Admitting: Internal Medicine

## 2019-03-05 ENCOUNTER — Other Ambulatory Visit (HOSPITAL_COMMUNITY)
Admission: RE | Admit: 2019-03-05 | Discharge: 2019-03-05 | Disposition: A | Discharge status: Home / Self Care | Payer: BC Managed Care – PPO | Source: Other Acute Inpatient Hospital | Attending: Internal Medicine | Admitting: Internal Medicine

## 2019-03-05 DIAGNOSIS — E11621 Type 2 diabetes mellitus with foot ulcer: Secondary | ICD-10-CM | POA: Diagnosis not present

## 2019-03-05 DIAGNOSIS — E1142 Type 2 diabetes mellitus with diabetic polyneuropathy: Secondary | ICD-10-CM | POA: Insufficient documentation

## 2019-03-05 DIAGNOSIS — Z881 Allergy status to other antibiotic agents status: Secondary | ICD-10-CM | POA: Diagnosis not present

## 2019-03-05 DIAGNOSIS — Z880 Allergy status to penicillin: Secondary | ICD-10-CM | POA: Insufficient documentation

## 2019-03-05 DIAGNOSIS — E1169 Type 2 diabetes mellitus with other specified complication: Secondary | ICD-10-CM | POA: Insufficient documentation

## 2019-03-05 DIAGNOSIS — M009 Pyogenic arthritis, unspecified: Secondary | ICD-10-CM | POA: Diagnosis not present

## 2019-03-05 DIAGNOSIS — L97522 Non-pressure chronic ulcer of other part of left foot with fat layer exposed: Secondary | ICD-10-CM | POA: Insufficient documentation

## 2019-03-05 DIAGNOSIS — L97521 Non-pressure chronic ulcer of other part of left foot limited to breakdown of skin: Secondary | ICD-10-CM | POA: Insufficient documentation

## 2019-03-05 DIAGNOSIS — M86672 Other chronic osteomyelitis, left ankle and foot: Secondary | ICD-10-CM | POA: Insufficient documentation

## 2019-03-05 NOTE — Progress Notes (Signed)
[name], Max ([id number]) Visit Report for 03/05/2019 HPI Details Patient Name: Date of Service: [name], Max 03/05/2019 9:00 AM Medical Record [id number] Patient Account Number: [id number] Date of Birth/Sex: Treating RN: 1986-03-15 (33 y.o. 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 He comes in for review of a wound over the plantar fifth metatarsal head extending into the lateral part of the foot. He was followed for this previously by his podiatrist Dr. Cornelius Moras. As the patient tells his story he went to see podiatry first for a swelling he developed on the lateral part of his fifth metatarsal head in May. He states this was "open" by podiatry and the area closed. He was followed up in June and it was again opened callus removed and it closed promptly. There were plans being made for surgery on the fifth metatarsal head in June however his blood sugar was apparently too high for anesthesia. Apparently the area was debrided and opened again in June and it is never closed since. Looking over the records from podiatry I am really not able to follow this. It was clear when he was first seen it was before 5/14 at that point he already had a wound. By 5/17 the ulcer was resolved. I do not see anything about a procedure. On 5/28 noted to have pre-ulcerative moderate keratosis. X-ray noted 1/5 contracted toe and tailor's bunion and metatarsal deformity. On a visit date on 09/28/2018 the dorsal part of the left foot it healed and resolved. There was concern about swelling in his lower extremity he was sent to the ER.. As far as I can tell he was seen in the ER on 7/12 with an ulcer on his left foot. A DVT rule out of the left  leg was negative. I do not think I have complete records from podiatry but I am not able to verify the procedures this patient states he had. He states after the last procedure the wound has never closed although I am not able to follow this in the records I have from podiatry. He has not had a recent x-ray The patient has been using Neosporin on the wound. He is wearing a Darco shoe. He is still very active up on his foot working and exercising. Past medical history; type 2 diabetes ketosis-prone, leg swelling with a negative DVT study in July. Non-smoker ABI in our clinic was 0.85 on the left 10/16; substantial wound on the plantar left fifth met head extending laterally almost to the dorsal fifth MTP. We have been using silver alginate we gave him a Darco forefoot off loader. An x-ray did not show evidence of osteomyelitis did note soft tissue emphysema which I think was due to gas tracking through an open wound. There is no doubt in my mind he requires an MRI 10/23; MRI not booked until 3 November at the earliest this is largely due to his glucose sensor in the right arm. We have been using silver alginate. There has been an improvement 10/29; I am still not exactly sure when his MRI is booked for. He says it is the third but it is the 10th in epic. This definitely needs to be done. He is running a low-grade fever today but no other symptoms. No real improvement in the 1 02/26/2019 patient  presents today for a follow-up visit here in our clinic he is last been seen in the clinic on October 29. Subsequently we were working on getting MRI to evaluate and see what exactly was going on and where we would need to go from the standpoint of whether or not he had osteomyelitis and again what treatments were going be required. Subsequently the patient ended up being admitted to the hospital on 02/07/2019 and was discharged on 02/14/2019. This is a somewhat interesting admission with a discharge  diagnosis of pneumonia due to COVID-19 although he was positive for COVID-19 when tested at the urgent care but negative x2 when he was actually in the hospital. With that being said he did have acute respiratory failure with hypoxia and it was noted he also have a left foot ulceration with osteomyelitis. With that being said he did require oxygen for his pneumonia and I level 4 L. He was placed on antivirals and steroids for the COVID-19. He was also transferred to the [facility] at one point. Nonetheless he did subsequently discharged home and since being home has done much better in that regard. The CT angiogram did not show any pulmonary embolism. With regard to the osteomyelitis the patient was placed on vancomycin and Zosyn while in the hospital but has been changed to Augmentin at discharge. It was also recommended that he follow-up with wound care and podiatry. Podiatry however wanted him to see [name] according to the patient prior to them doing anything further. His hemoglobin A1c was 9.9 as noted in the hospital. Have an MRI of the left foot performed while in the hospital on 02/04/2019. 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 There is an overlying plantar open wound noted an abscess tracking back along the lateral aspect of the fifth metatarsal shaft. There is otherwise diffuse cellulitis and mild fasciitis without findings of polymyositis. The patient did have recently pneumonia secondary to COVID-19 I looked in the chart through epic and it does appear that the patient may need to have an additional x-ray just to ensure everything is cleared and that he has no airspace disease prior to putting him into the chamber. 03/05/2019; patient was readmitted to the clinic last week. He was hospitalized twice for a viral upper respiratory tract infection from 11/1 through 11/4 and then 11/5  through 11/12 ultimately this turned out to be Covid pneumonitis. Although he was discharged on oxygen he is not using it. He says he feels fine. He has no exercise limitation no cough no sputum. His O2 sat in our clinic today was 100% on room air. 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 He received Vanco and Zosyn in the hospital and then was discharged on 2 weeks of Augmentin. I do not see any relevant cultures. He was supposed to follow-up with infectious disease but I do not see that he has an appointment. Electronic Signature(s) Signed: 03/05/2019 6:45:39 PM By: [name] MD Entered By: [name] on 03/05/2019 11:34:08 -------------------------------------------------------------------------------- Physical Exam Details Patient Name: Date of Service: [name], Max 03/05/2019 9:00 AM Medical Record [id number] Patient Account Number: [id number] Date of Birth/Sex: Treating RN: [birth year] (33 y.o. M) Primary Care Provider: [name] Other Clinician: Referring Provider: Treating Provider/Extender:Eirene Rather, Max [name], Ashby Dawes Weeks in Treatment: 7 Constitutional Sitting  or standing Blood Pressure is within target range for patient.. Slightly tachycardic. Respirations regular, non- labored and within target range.. Temperature is normal and within the target range for the patient.Marland Kitchen Appears in no distress. Respiratory work of breathing is normal. Bilateral breath sounds are clear and equal in all lobes with no wheezes, rales or rhonchi.. Cardiovascular Heart rhythm and rate regular, without murmur or gallop.. Integumentary (Hair, Skin) There is no erythema around the wound. Psychiatric appears at normal baseline. Notes Wound exam; the patient's wound is on the left fifth plantar metatarsal head. He has decent looking granulation  but still has undermining superiorly from roughly 1-4 o'clock. Scant amount of purulent drainage identified which I cultured for CandS. There is no exposed bone. Electronic Signature(s) Signed: 03/05/2019 6:45:39 PM By: [name] MD Entered By: [name] on 03/05/2019 11:35:25 -------------------------------------------------------------------------------- Physician Orders Details Patient Name: Date of Service: Arterburn, Max 03/05/2019 9:00 AM Medical Record [id number] Patient Account Number: [id number] Date of Birth/Sex: Treating RN: [birth year] (33 y.o. 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 Dressing Change Frequency Wound #1 Left Metatarsal head fifth Change dressing every day. Skin Barriers/Peri-Wound Care Moisturizing lotion - to both legs daily. Wound Cleansing Wound #1 Left Metatarsal head fifth May shower and wash wound with soap and water. Primary Wound Dressing Wound #1 Left Metatarsal head fifth Calcium Alginate with Silver Secondary Dressing Wound #1 Left Metatarsal head fifth Kerlix/Rolled Gauze Dry Gauze Other: - felt callous pad Off-Loading Wedge shoe to: - front offloading darco to left foot Hyperbaric Oxygen Therapy Wound #1 Left Metatarsal head fifth Evaluate for HBO Therapy Indication: - osteomylitis left foot If appropriate for treatment, begin HBOT per protocol: 2.0 ATA for 90 Minutes without Air Breaks 2.5 ATA for 90 Minutes with 2 Five (5) Minute Air Breaks Total Number  of Treatments: - 40 One treatments per day (delivered Monday through Friday unless otherwise specified in Special Instructions below): Finger stick Blood Glucose Pre- and Post- HBOT Treatment. Follow Hyperbaric Oxygen Glycemia Protocol Laboratory Bacteria identified in Unspecified specimen by Anaerobe culture (MICRO) - non healing wound left foot, with purelent drainage - (ICD10 L97.521 - Non-pressure chronic ulcer of other part of left foot limited to breakdown of skin) LOINC Code: 177-9 Convenience Name: Anerobic culture Patient Medications Allergies: metformin Notifications Medication Indication Start End Augmentin osteomyelitis 03/05/2019 DOSE oral 875 mg-125 mg tablet - 1 tablet oral bid for 7 days (continuing rx) GLYCEMIA INTERVENTIONS PROTOCOL PRE-HBO GLYCEMIA INTERVENTIONS ACTION INTERVENTION Obtain pre-HBO capillary blood 1 glucose (ensure physician order is in chart). A. Notify HBO physician and await physician orders. 2 If result is 70 mg/dl or below: B. If the result meets the hospital definition of a critical result, follow hospital policy. A. Give patient an 8 ounce Glucerna Shake, an 8 ounce Ensure, or 8 ounces of a Glucerna/Ensure equivalent dietary supplement*. B. Wait 30 minutes. C. Retest patients capillary blood If result is 71 mg/dl to 130 mg/dl: glucose (CBG). D. If result greater than or equal to 110 mg/dl, proceed with HBO. If result less than 110 mg/dl, notify HBO physician and consider holding HBO. If result is  131 mg/dl to 249 mg/dl: A. Proceed with HBO. A. Notify HBO physician and await physician orders. B. It is recommended to hold HBO If result is 250 mg/dl or greater: and do blood/urine ketone testing. C. If the result meets the hospital definition of a critical result, follow hospital policy. POST-HBO GLYCEMIA INTERVENTIONS ACTION INTERVENTION Obtain post HBO capillary blood 1 glucose (ensure physician order is in chart). A.  Notify HBO physician and await physician orders. 2 If result is 70 mg/dl or below: B. If the result meets the hospital definition of a critical result, follow hospital policy. A. Give patient an 8 ounce Glucerna Shake, an 8 ounce Ensure, or 8 ounces of a Glucerna/Ensure equivalent dietary supplement*. B. Wait 15 minutes for symptoms of hypoglycemia (i.e. nervousness, anxiety, sweating, chills, If result is 71 mg/dl to 100 mg/dl: clamminess, irritability, confusion, tachycardia or dizziness). C. If patient asymptomatic, discharge patient. If patient symptomatic, repeat capillary blood glucose (CBG) and notify HBO physician. If result is 101 mg/dl to 249 mg/dl: A. Discharge patient. A. Notify HBO physician and await physician orders. B. It is recommended to do If result is 250 mg/dl or greater: blood/urine ketone testing. C. If the result meets the hospital definition of a critical result, follow hospital policy. *Juice or candies are NOT equivalent products. If patient refuses the Glucerna or Ensure, please consult the hospital dietitian for an appropriate substitute. Electronic Signature(s) Signed: 03/05/2019 6:45:39 PM By: [name] MD Previous Signature: 03/05/2019 11:22:00 AM Version By: [name] MD Entered By: [name] on 03/05/2019 11:38:04 -------------------------------------------------------------------------------- Problem List Details Patient Name: Date of Service: [name], Max 03/05/2019 9:00 AM Medical Record [id number] Patient Account Number: [id number] Date of Birth/Sex: Treating RN: June 01, 1985 (33 y.o. Staci Acosta, Morey Hummingbird Primary Care Provider: [name] Other Clinician: Referring Provider: Treating Provider/Extender:Anarely Nicholls, Max [name], Max [name] in Treatment: 7 Active Problems ICD-10 Evaluated Encounter Code Description Active Date Today Diagnosis E11.621 Type 2 diabetes mellitus with foot ulcer 01/11/2019 No  Yes L97.521 Non-pressure chronic ulcer of other part of left foot 01/11/2019 No Yes limited to breakdown of skin E11.42 Type 2 diabetes mellitus with diabetic polyneuropathy 01/11/2019 No Yes M86.672 Other chronic osteomyelitis, left ankle and foot 03/05/2019 No Yes M00.9 Pyogenic arthritis, unspecified 03/05/2019 No Yes Inactive Problems Resolved Problems Electronic Signature(s) Signed: 03/05/2019 6:45:39 PM By: [name] MD Entered By: [name] on 03/05/2019 11:31:50 -------------------------------------------------------------------------------- Progress Note Details Patient Name: Date of Service: [name], Max 03/05/2019 9:00 AM Medical Record [id number] Patient Account Number: [id number] Date of Birth/Sex: Treating RN: 03-04-1986 (33 y.o. 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 He comes in for review of a wound over the plantar fifth metatarsal head extending into the lateral part of the foot. He was followed for this previously by his podiatrist Dr. Cornelius Moras. As the patient tells his story he went to see podiatry first for a swelling he developed on the lateral part of his fifth metatarsal head in May. He states this was "open" by podiatry and the area closed. He was followed up in June and it was again opened callus removed and it closed promptly. There were plans being made for surgery on the fifth metatarsal head in June however his blood sugar was apparently too high for anesthesia. Apparently the area was  debrided and opened again in June and it is never closed since. Looking over the records from podiatry I am really not able to follow this. It was clear when he was first seen it was  before 5/14 at that point he already had a wound. By 5/17 the ulcer was resolved. I do not see anything about a procedure. On 5/28 noted to have pre-ulcerative moderate keratosis. X-ray noted 1/5 contracted toe and tailor's bunion and metatarsal deformity. On a visit date on 09/28/2018 the dorsal part of the left foot it healed and resolved. There was concern about swelling in his lower extremity he was sent to the ER.. As far as I can tell he was seen in the ER on 7/12 with an ulcer on his left foot. A DVT rule out of the left leg was negative. I do not think I have complete records from podiatry but I am not able to verify the procedures this patient states he had. He states after the last procedure the wound has never closed although I am not able to follow this in the records I have from podiatry. He has not had a recent x-ray The patient has been using Neosporin on the wound. He is wearing a Darco shoe. He is still very active up on his foot working and exercising. Past medical history; type 2 diabetes ketosis-prone, leg swelling with a negative DVT study in July. Non-smoker ABI in our clinic was 0.85 on the left 10/16; substantial wound on the plantar left fifth met head extending laterally almost to the dorsal fifth MTP. We have been using silver alginate we gave him a Darco forefoot off loader. An x-ray did not show evidence of osteomyelitis did note soft tissue emphysema which I think was due to gas tracking through an open wound. There is no doubt in my mind he requires an MRI 10/23; MRI not booked until 3 November at the earliest this is largely due to his glucose sensor in the right arm. We have been using silver alginate. There has been an improvement 10/29; I am still not exactly sure when his MRI is booked for. He says it is the third but it is the 10th in epic. This definitely needs to be done. He is running a low-grade fever today but no other symptoms. No real improvement in the  1 02/26/2019 patient presents today for a follow-up visit here in our clinic he is last been seen in the clinic on October 29. Subsequently we were working on getting MRI to evaluate and see what exactly was going on and where we would need to go from the standpoint of whether or not he had osteomyelitis and again what treatments were going be required. Subsequently the patient ended up being admitted to the hospital on 02/07/2019 and was discharged on 02/14/2019. This is a somewhat interesting admission with a discharge diagnosis of pneumonia due to COVID-19 although he was positive for COVID-19 when tested at the urgent care but negative x2 when he was actually in the hospital. With that being said he did have acute respiratory failure with hypoxia and it was noted he also have a left foot ulceration with osteomyelitis. With that being said he did require oxygen for his pneumonia and I level 4 L. He was placed on antivirals and steroids for the COVID-19. He was also transferred to the [facility] at one point. Nonetheless he did subsequently discharged home and since being home has done much better in  that regard. The CT angiogram did not show any pulmonary embolism. With regard to the osteomyelitis the patient was placed on vancomycin and Zosyn while in the hospital but has been changed to Augmentin at discharge. It was also recommended that he follow-up with wound care and podiatry. Podiatry however wanted him to see [name] according to the patient prior to them doing anything further. His hemoglobin A1c was 9.9 as noted in the hospital. Have an MRI of the left foot performed while in the hospital on 02/04/2019. 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 There is an overlying plantar open wound noted an abscess tracking back along the lateral aspect of the fifth metatarsal shaft. There is  otherwise diffuse cellulitis and mild fasciitis without findings of polymyositis. The patient did have recently pneumonia secondary to COVID-19 I looked in the chart through epic and it does appear that the patient may need to have an additional x-ray just to ensure everything is cleared and that he has no airspace disease prior to putting him into the chamber. 03/05/2019; patient was readmitted to the clinic last week. He was hospitalized twice for a viral upper respiratory tract infection from 11/1 through 11/4 and then 11/5 through 11/12 ultimately this turned out to be Covid pneumonitis. Although he was discharged on oxygen he is not using it. He says he feels fine. He has no exercise limitation no cough no sputum. His O2 sat in our clinic today was 100% on room air. 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 He received Vanco and Zosyn in the hospital and then was discharged on 2 weeks of Augmentin. I do not see any relevant cultures. He was supposed to follow-up with infectious disease but I do not see that he has an appointment. Objective Constitutional Sitting or standing Blood Pressure is within target range for patient.. Slightly tachycardic. Respirations regular, non- labored and within target range.. Temperature is normal and within the target range for the patient.Marland Kitchen Appears in no distress. Vitals Time Taken: 9:50 AM, Height: 77 in, Weight: 260 lbs, BMI: 30.8, Temperature: 98.4 F, Pulse: 106 bpm, Respiratory Rate: 18 breaths/min, Blood Pressure: 101/59 mmHg, Capillary Blood Glucose: 200 mg/dl. General Notes: glucose per pt report Respiratory work of breathing is normal. Bilateral breath sounds are clear and equal in all lobes with no wheezes, rales or rhonchi.. Cardiovascular Heart rhythm and rate regular, without murmur or gallop.Marland Kitchen Psychiatric appears at normal  baseline. General Notes: Wound exam; the patient's wound is on the left fifth plantar metatarsal head. He has decent looking granulation but still has undermining superiorly from roughly 1-4 o'clock. Scant amount of purulent drainage identified which I cultured for CandS. There is no exposed bone. Integumentary (Hair, Skin) There is no erythema around the wound. Wound #1 status is Open. Original cause of wound was Trauma. The wound is located on the Left Metatarsal head fifth. The wound measures 2cm length x 2.5cm width x 0.4cm depth; 3.927cm^2 area and 1.571cm^3 volume. There is Fat Layer (Subcutaneous Tissue) Exposed exposed. There is no tunneling noted, however, there is undermining starting at 9:00 and ending at 10:00 with a maximum distance of 1.8cm. There is a medium amount of serosanguineous drainage noted. The wound margin is well defined and not attached to the wound base. There is large (67-100%) pink granulation  within the wound bed. There is a small (1-33%) amount of necrotic tissue within the wound bed including Adherent Slough. 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 Dressing Change Frequency: Wound #1 Left Metatarsal head fifth: Change dressing every day. Skin Barriers/Peri-Wound Care: Moisturizing lotion - to both legs daily. Wound Cleansing: Wound #1 Left Metatarsal head fifth: May shower and wash wound with soap and water. Primary Wound Dressing: Wound #1 Left Metatarsal head fifth: Calcium Alginate with Silver Secondary Dressing: Wound #1 Left Metatarsal head fifth: Kerlix/Rolled Gauze Dry Gauze Other: - felt callous pad Off-Loading: Wedge shoe to: -  front offloading darco to left foot Hyperbaric Oxygen Therapy: Wound #1 Left Metatarsal head fifth: Evaluate for HBO Therapy Indication: - osteomylitis left foot If appropriate for treatment, begin HBOT per protocol: 2.0 ATA for 90 Minutes without Air Breaks 2.5 ATA for 90 Minutes with 2 Five (5) Minute Air Breaks Total Number of Treatments: - 40 One treatments per day (delivered Monday through Friday unless otherwise specified in Special Instructions below): Finger stick Blood Glucose Pre- and Post- HBOT Treatment. Follow Hyperbaric Oxygen Glycemia Protocol Laboratory ordered were: Anerobic culture - non healing wound left foot, with purelent drainage The following medication(s) was prescribed: Augmentin oral 875 mg-125 mg tablet 1 tablet oral bid for 7 days (continuing rx) for osteomyelitis starting 03/05/2019 1. I extended the patient's Augmentin 875 1 p.o. twice daily for 7 days 2. Await culture of the purulent drainage I was able to identify from the undermining area. 3. Silver alginate as the primary dressing make sure to take that lightly into the undermining area which may itself need to be debrided at some point 4. We will try to make sure he has infectious disease follow-up 5. He is a candidate for hyperbaric oxygen in addition to antibiotics prescribed by infectious disease. The issue of a ray amputation comes up here although I did not rediscuss this with him today 6. I have reviewed his recent chest x-ray it does have an interstitial pattern on an overpenetrated film although this is "it is being a lot better. I do not think that this is a contraindication to hyperbarics after we get this through his insurance. Electronic Signature(s) Signed: 03/05/2019 6:45:39 PM By: [name] MD Entered By: [name] on 03/05/2019 11:38:42 -------------------------------------------------------------------------------- SuperBill Details Patient Name: Date of  Service: Budnick, Max 03/05/2019 Medical Record [id number] Patient Account Number: [id number] Date of Birth/Sex: Treating RN: Jan 29, 1986 (33 y.o. 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, left ankle and foot M00.9 Pyogenic arthritis, unspecified Facility Procedures CPT4 Code: [id number] Description: 99213 - WOUND CARE VISIT-LEV 3 EST PT Modifier: Quantity: 1 Physician Procedures CPT4 Code Description: [id number] 99214 - WC PHYS LEVEL 4 - EST PT ICD-10 Diagnosis Description E11.621 Type 2 diabetes mellitus with foot ulcer L97.521 Non-pressure chronic ulcer of other part of left foot li E11.42 Type 2 diabetes mellitus with diabetic  polyneuropathy M86.672 Other  chronic osteomyelitis, left ankle and foot Modifier: mited to breakdo Quantity: 1 wn of skin Electronic Signature(s) Signed: 03/05/2019 6:45:39 PM By: [name] MD Entered By: [name] on 03/05/2019 11:38:55

## 2019-03-08 LAB — AEROBIC CULTURE W GRAM STAIN (SUPERFICIAL SPECIMEN)

## 2019-03-11 ENCOUNTER — Ambulatory Visit (INDEPENDENT_AMBULATORY_CARE_PROVIDER_SITE_OTHER): Payer: BC Managed Care – PPO | Admitting: Internal Medicine

## 2019-03-11 ENCOUNTER — Encounter: Payer: Self-pay | Admitting: Internal Medicine

## 2019-03-11 ENCOUNTER — Encounter (HOSPITAL_BASED_OUTPATIENT_CLINIC_OR_DEPARTMENT_OTHER): Payer: BC Managed Care – PPO | Admitting: Internal Medicine

## 2019-03-11 ENCOUNTER — Other Ambulatory Visit: Payer: Self-pay

## 2019-03-11 VITALS — BP 132/85 | HR 105 | Temp 98.1°F | Wt 273.0 lb

## 2019-03-11 DIAGNOSIS — U071 COVID-19: Secondary | ICD-10-CM

## 2019-03-11 DIAGNOSIS — M86172 Other acute osteomyelitis, left ankle and foot: Secondary | ICD-10-CM | POA: Diagnosis not present

## 2019-03-11 DIAGNOSIS — J1289 Other viral pneumonia: Secondary | ICD-10-CM | POA: Diagnosis not present

## 2019-03-11 DIAGNOSIS — J1282 Pneumonia due to coronavirus disease 2019: Secondary | ICD-10-CM

## 2019-03-11 NOTE — Progress Notes (Signed)
[facility]      Reason for Consult: ? osteomyelitis    Referring Physician: Dr. Dellia Nims    Patient ID: [name], male    DOB: 09-06-1985, 33 y.o.   MRN: [id number]  HPI:   Here for evaluation for osteomyelitis associated with a diabetic foot ulcer.  He has a history of type 1 diabetes with a recent Hgb A1C of 9.9 on 11/1 who comes to me for evaluation of his left lateral diabetic foot ulcer.  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  He was seen by Dr. Amalia Hailey from podiatry and no surgical intervention indicated and felt to be a chronic process.  He was also positive for COVID-19 at that time, positive for pneumonia.  He refused remdisivir at the time and was discharged on 11/4 with Augmentin with the plan to consider amputation of his infected bone.  He was then rehospitalized 11/5 with worsening sob and admitted from 11/5-11/12.  He remained on antibiotics and discharged with continued Augmentin.  This was also refilled by Dr. Dellia Nims recently.  His wound has been healing with less depth and no pus.  He is going to start hyperbaric oxygen chamber therapy tomorrow.  Previous record reviewed in Epic and partially summarized above.   Past Medical History:  Diagnosis Date  . Diabetes mellitus   COVID-19 pneumonia  Prior to Admission medications   Medication Sig Start Date End Date Taking? Authorizing Provider  acetaminophen (TYLENOL) 500 MG tablet Take 500-1,000 mg by mouth every 6 (six) hours as needed for mild pain, fever or headache.     [provider]  Continuous Blood Gluc Sensor (FREESTYLE LIBRE 14 DAY SENSOR) MISC Inject 1 patch into the skin every 14 (fourteen) days.    [provider]  Continuous Blood Gluc Sensor MISC 1 each by Does not apply route as directed. Use as directed every 14 days. May dispense FreeStyle [employer] or similar. Patient not  taking: Reported on 02/03/2019 06/30/17   Kayleen Memos, DO  dexamethasone (DECADRON) 2 MG tablet Take 2 tablets once daily for 3 days, then 1 tablet once daily for 3 days, then STOP. 02/14/19   Bonnielee Haff, MD  insulin aspart (NOVOLOG) 100 UNIT/ML injection Inject 0-15 Units into the skin 3 (three) times daily with meals. Patient taking differently: Inject 7-12 Units into the skin 3 (three) times daily as needed (before meals, if BGL is 250 or greater).  07/01/17   Cherene Altes, MD  Insulin Glargine (BASAGLAR KWIKPEN) 100 UNIT/ML SOPN Inject 0.24 mLs (24 Units total) into the skin at bedtime. Patient taking differently: Inject 40 Units into the skin every morning.  07/01/17   Cherene Altes, MD  Insulin Glargine (LANTUS SOLOSTAR) 100 UNIT/ML Solostar Pen Inject 40 Units into the skin daily before breakfast.    [provider]  Multiple Vitamins-Minerals (EMERGEN-C IMMUNE) PACK Take 1 packet by mouth daily as needed (for supplementation).    [provider]  multivitamin (ONE-A-DAY MEN'S) TABS tablet Take 1 tablet by mouth daily with breakfast.    [provider]  oxyCODONE (OXY IR/ROXICODONE) 5 MG immediate release tablet Take 1 tablet (5 mg total) by mouth every 4 (four) hours as needed for severe pain. 02/06/19   Donne Hazel, MD  Zinc Sulfate 220 (50 Zn) MG TABS Take 1 tablet (220 mg total) by mouth daily. 02/06/19  Donne Hazel, MD    Allergies  Allergen Reactions  . Basaglar Claiborne Rigg [Insulin Glargine] Diarrhea  . Metformin And Related Diarrhea and Nausea And Vomiting  . Trulicity [Dulaglutide] Diarrhea    Social History   Tobacco Use  . Smoking status: Never Smoker  . Smokeless tobacco: Never Used  Substance Use Topics  . Alcohol use: No  . Drug use: No    Family History  Problem Relation Age of Onset  . Hypertension Other   . Diabetes Other   . Healthy Mother     Review of Systems  Constitutional: negative for fevers and chills  Respiratory: positive for dyspnea on exertion, negative for cough or sputum Gastrointestinal: negative for nausea and diarrhea Integument/breast: negative for rash All other systems reviewed and are negative    Constitutional: in no apparent distress There were no vitals filed for this visit. EYES: anicteric ENMT: + mask Cardiovascular: Cor RRR Respiratory: CTA B; normal respiratory effort Musculoskeletal: left lateral wound with mainly pink granulation tissue in the wound bed, no surrounding erythema Skin: negatives: no rash Neuro: non-focal  Labs: Lab Results  Component Value Date   WBC 7.9 02/14/2019   HGB 12.8 (L) 02/14/2019   HCT 41.0 02/14/2019   MCV 85.8 02/14/2019   PLT 363 02/14/2019    Lab Results  Component Value Date   CREATININE 0.81 02/14/2019   BUN 14 02/14/2019   NA 137 02/14/2019   K 4.0 02/14/2019   CL 98 02/14/2019   CO2 29 02/14/2019    Lab Results  Component Value Date   ALT 53 (H) 02/14/2019   AST 40 02/14/2019   ALKPHOS 58 02/14/2019   BILITOT 0.2 (L) 02/14/2019   INR 1.1 02/03/2019     Assessment: 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  The wound bed is filling in with granulation tissue, the size is decreasing per the patient and no pus or signs of infection.  At this time, it appears healed from an infection standpoint.  I did discuss with him at this time, it looks good, if he develops further signs of infection after stopping the Augmentin, it would likely require further surgical approach, likely amputation.  However, hopefully with the antibiotics he has received for a prolonged period, he will not require further intervention, aside from wound care.  For his COVID, he does get DOE and his oxygen saturation drops to about 88-90%.  I have provided him a letter that he will continue to need oxygen therapy for 2 more weeks.    Plan: 1) stop Augmentin 2) ESR and CRP 3) letter provided for continued  oxygen therapy

## 2019-03-12 ENCOUNTER — Encounter (HOSPITAL_BASED_OUTPATIENT_CLINIC_OR_DEPARTMENT_OTHER): Payer: BC Managed Care – PPO | Admitting: Internal Medicine

## 2019-03-12 ENCOUNTER — Other Ambulatory Visit: Payer: Self-pay

## 2019-03-12 DIAGNOSIS — E11621 Type 2 diabetes mellitus with foot ulcer: Secondary | ICD-10-CM | POA: Diagnosis not present

## 2019-03-12 LAB — C-REACTIVE PROTEIN: CRP: 3.3 mg/L (ref <8.0)

## 2019-03-12 LAB — SEDIMENTATION RATE: Sed Rate: 17 mm/h — ABNORMAL HIGH (ref 0–15)

## 2019-03-12 LAB — GLUCOSE, CAPILLARY
Glucose-Capillary: 195 mg/dL — ABNORMAL HIGH (ref 70–99)
Glucose-Capillary: 206 mg/dL — ABNORMAL HIGH (ref 70–99)

## 2019-03-12 NOTE — Progress Notes (Signed)
[place], [name] ([id number]) Visit Report for 01/11/2019 Allergy List Details Patient Name: Date of Service: [place], [name] 01/11/2019 2:45 PM Medical Record Number:[id number] Patient Account Number: [id number] Date of Birth/Sex: Treating RN: [birth year] (33 y.o. [name]) Yevonne Pax Primary Care Christalyn Goertz: Katy Apo Other Clinician: Referring Kadeshia Kasparian: Treating Burnadette Baskett/Extender:Robson, Almira Coaster, Deirdre Peer Weeks in Treatment: 0 Allergies Active Allergies metformin Allergy Notes Electronic Signature(s) Signed: 03/12/2019 2:59:38 PM By: Yevonne Pax RN Entered By: Yevonne Pax on 01/11/2019 16:34:45 -------------------------------------------------------------------------------- Arrival Information Details Patient Name: Date of Service: [place], [name] 01/11/2019 2:45 PM Medical Record Number:[id number] Patient Account Number: [id number] Date of Birth/Sex: Treating RN: 05/17/1985 (33 y.o. [name] Primary Care Thaddus Mcdowell: Katy Apo Other Clinician: Referring Quanetta Truss: Treating Avedis Bevis/Extender:Robson, Almira Coaster, Chalmers Guest in Treatment: 0 Visit Information Patient Arrived: Ambulatory Arrival Time: 15:43 Accompanied By: self Transfer Assistance: None Patient Identification Verified: Yes Secondary Verification Process Completed: Yes Patient Requires Transmission-Based No Precautions: Patient Has Alerts: No Electronic Signature(s) Signed: 03/12/2019 2:59:38 PM By: Yevonne Pax RN Entered By: Yevonne Pax on 01/11/2019 15:45:02 -------------------------------------------------------------------------------- Encounter Discharge Information Details Patient Name: Date of Service: [place], [name] 01/11/2019 2:45 PM Medical Record Number:[id number] Patient Account Number: [id number] Date of Birth/Sex: Treating RN: [birth year] (33 y.o. [name] Primary Care Dhruvi Crenshaw: Katy Apo Other Clinician: Referring Boysie Bonebrake: Treating  Alyse Kathan/Extender:Robson, Almira Coaster, Chalmers Guest in Treatment: 0 Encounter Discharge Information Items Discharge Condition: Stable Ambulatory Status: Ambulatory Discharge Destination: Home Transportation: Private Auto Accompanied By: self Schedule Follow-up Appointment: Yes Clinical Summary of Care: Patient Declined Electronic Signature(s) Signed: 01/14/2019 5:18:03 PM By: Cherylin Mylar Entered By: Cherylin Mylar on 01/11/2019 17:30:28 -------------------------------------------------------------------------------- Lower Extremity Assessment Details Patient Name: Date of Service: [place], [name] 01/11/2019 2:45 PM Medical Record Number:[id number] Patient Account Number: [id number] Date of Birth/Sex: Treating RN: [birth year] (33 y.o. [name] Primary Care Kendricks Reap: Katy Apo Other Clinician: Referring Mare Ludtke: Treating Rinnah Peppel/Extender:Robson, Almira Coaster, Deirdre Peer Weeks in Treatment: 0 Edema Assessment Assessed: [Left: Yes] [Right: No] Edema: [Left: Ye] [Right: s] Calf Left: Right: Point of Measurement: 48 cm From Medial Instep 44 cm cm Ankle Left: Right: Point of Measurement: 12 cm From Medial Instep 32 cm cm Vascular Assessment Blood Pressure: Brachial: [Left:117] Ankle: [Left:Dorsalis Pedis: 100 0.85] Electronic Signature(s) Signed: 03/12/2019 2:59:38 PM By: Yevonne Pax RN Entered By: Yevonne Pax on 01/11/2019 16:10:40 -------------------------------------------------------------------------------- Multi Wound Chart Details Patient Name: Date of Service: [place], [name] 01/11/2019 2:45 PM Medical Record Number:[id number] Patient Account Number: [id number] Date of Birth/Sex: Treating RN: March 23, 1986 (33 y.o. [name] Primary Care Jaryn Rosko: Katy Apo Other Clinician: Referring Smitty Ackerley: Treating Kemya Shed/Extender:Robson, Almira Coaster, Deirdre Peer Weeks in Treatment: 0 Vital Signs Height(in): 77 Pulse(bpm):  97 Weight(lbs): 260 Blood Pressure(mmHg): 117/86 Body Mass Index(BMI): 31 Temperature(F): 98.3 Respiratory 16 Rate(breaths/min): Photos: [1:No Photos] [N/A:N/A] Wound Location: [1:Left Metatarsal head fifth N/A] Wounding Event: [1:Trauma] [N/A:N/A] Primary Etiology: [1:Diabetic Wound/Ulcer of the N/A Lower Extremity] Comorbid History: [1:Type II Diabetes] [N/A:N/A] Date Acquired: [1:08/03/2018] [N/A:N/A] Weeks of Treatment: [1:0] [N/A:N/A] Wound Status: [1:Open] [N/A:N/A] Measurements L x W x D 2.2x1.7x0.8 [N/A:N/A] (cm) Area (cm) : [1:2.937] [N/A:N/A] Volume (cm) : [1:2.35] [N/A:N/A] % Reduction in Area: [1:0.00%] [N/A:N/A] % Reduction in Volume: 0.00% [N/A:N/A] Starting Position 1 12 (o'clock): Ending Position 1 [1:12] (o'clock): Maximum Distance 1 [1:2.6] (cm): Undermining: [1:Yes] [N/A:N/A] Classification: [1:Grade 2] [N/A:N/A] Exudate Amount: [1:Medium] [N/A:N/A] Exudate Type: [1:Serosanguineous] [N/A:N/A] Exudate Color: [1:red, brown] [N/A:N/A] Wound Margin: [1:Well defined, not attached] [N/A:N/A] Granulation Amount: [1:Large (67-100%)] [N/A:N/A] Granulation Quality: [1:Red,  Pink] [N/A:N/A] Necrotic Amount: [1:Small (1-33%)] [N/A:N/A] Exposed Structures: [1:Fat Layer (Subcutaneous Tissue) Exposed: Yes Fascia: No Tendon: No Muscle: No Joint: No Bone: No None] [N/A:N/A N/A] Treatment Notes Wound #1 (Left Metatarsal head fifth) 1. Cleanse With Wound Cleanser 2. Periwound Care Skin Prep 3. Primary Dressing Applied Calcium Alginate Ag 4. Secondary Dressing Dry Gauze Roll Gauze 7. Footwear/Offloading device applied Wedge shoe Electronic Signature(s) Signed: 01/13/2019 10:27:00 AM By: Baltazar Najjarobson, Michael MD Signed: 01/15/2019 5:52:09 PM By: Zandra AbtsLynch, Shatara RN, BSN Entered By: Baltazar Najjarobson, Michael on 01/11/2019 17:49:09 -------------------------------------------------------------------------------- Multi-Disciplinary Care Plan Details Patient Name: Date of  Service: [place], [name] 01/11/2019 2:45 PM Medical Record Number:[id number] Patient Account Number: [id number] Date of Birth/Sex: Treating RN: 1985/06/26 (33 y.o. Elizebeth KollerM) Lynch, Shatara Primary Care Dalina Samara: Katy ApoPolite, Ronald D Other Clinician: Referring Margretta Zamorano: Treating Lataja Newland/Extender:Robson, Almira CoasterMichael [name], Chalmers Guestonald D Weeks in Treatment: 0 Active Inactive Nutrition Nursing Diagnoses: Impaired glucose control: actual or potential Potential for alteratiion in Nutrition/Potential for imbalanced nutrition Goals: Patient/caregiver agrees to and verbalizes understanding of need to use nutritional supplements and/or vitamins as prescribed Date Initiated: 01/11/2019 Target Resolution Date: 02/08/2019 Goal Status: Active Patient/caregiver will maintain therapeutic glucose control Date Initiated: 01/11/2019 Target Resolution Date: 02/08/2019 Goal Status: Active Interventions: Assess HgA1c results as ordered upon admission and as needed Assess patient nutrition upon admission and as needed per policy Provide education on elevated blood sugars and impact on wound healing Provide education on nutrition Notes: Wound/Skin Impairment Nursing Diagnoses: Impaired tissue integrity Knowledge deficit related to ulceration/compromised skin integrity Goals: Patient/caregiver will verbalize understanding of skin care regimen Date Initiated: 01/11/2019 Target Resolution Date: 02/08/2019 Goal Status: Active Ulcer/skin breakdown will have a volume reduction of 30% by week 4 Date Initiated: 01/11/2019 Target Resolution Date: 02/08/2019 Goal Status: Active Interventions: Assess patient/caregiver ability to obtain necessary supplies Assess patient/caregiver ability to perform ulcer/skin care regimen upon admission and as needed Assess ulceration(s) every visit Provide education on ulcer and skin care Notes: Electronic Signature(s) Signed: 01/15/2019 5:52:09 PM By: Zandra AbtsLynch, Shatara RN, BSN Entered By: Zandra AbtsLynch,  Shatara on 01/11/2019 17:15:08 -------------------------------------------------------------------------------- Pain Assessment Details Patient Name: Date of Service: [place], [name] 01/11/2019 2:45 PM Medical Record [id number] Patient Account Number: [id number] Date of Birth/Sex: Treating RN: 1985/06/26 (33 y.o. [name]) Epps, Carrie Primary Care Elonzo Sopp: Katy ApoPolite, Ronald D Other Clinician: Referring Mackenzie Groom: Treating Rechelle Niebla/Extender:Robson, Almira CoasterMichael [name], Deirdre Peeronald D Weeks in Treatment: 0 Active Problems Location of Pain Severity and Description of Pain Patient Has Paino No Site Locations Pain Management and Medication Current Pain Management: Electronic Signature(s) Signed: 03/12/2019 2:59:38 PM By: Yevonne PaxEpps, Carrie RN Entered By: Yevonne PaxEpps, Carrie on 01/11/2019 16:13:53 -------------------------------------------------------------------------------- Patient/Caregiver Education Details Patient Name: Date of Service: [place], [name] 10/9/2020andnbsp2:45 PM Medical Record Number:[id number] Patient Account Number: [id number] Date of Birth/Gender: Treating RN: 1985/06/26 (33 y.o. Elizebeth KollerM) Lynch, Shatara Primary Care Physician: Katy ApoPolite, Ronald D Other Clinician: Referring Physician: Treating Physician/Extender:Robson, Almira CoasterMichael [name], Chalmers Guestonald D Weeks in Treatment: 0 Education Assessment Education Provided To: Patient Education Topics Provided Elevated Blood Sugar/ Impact on Healing: Handouts: Elevated Blood Sugars: How Do They Affect Wound Healing Methods: Explain/Verbal, Printed Responses: State content correctly Offloading: Handouts: What is Offloadingo Methods: Explain/Verbal, Printed Responses: State content correctly Wound/Skin Impairment: Handouts: Caring for Your Ulcer Methods: Explain/Verbal, Printed Responses: State content correctly Electronic Signature(s) Signed: 01/15/2019 5:52:09 PM By: Zandra AbtsLynch, Shatara RN, BSN Entered By: Zandra AbtsLynch, Shatara on 01/11/2019  17:15:58 -------------------------------------------------------------------------------- Wound Assessment Details Patient Name: Date of Service: [place], [name] 01/11/2019 2:45 PM Medical Record [id number] Patient Account Number: [id number] Date of Birth/Sex: Treating RN: 1985/06/26 (33  y.o. Oval Linsey Primary Care Hosanna Betley: [name] Other Clinician: Referring Marshayla Mitschke: Treating Francely Craw/Extender:Robson, [name], Ashby Dawes Weeks in Treatment: 0 Wound Status Wound Number: 1 Primary Diabetic Wound/Ulcer of the Lower Etiology: Extremity Wound Location: Left Metatarsal head fifth Wound Status: Open Wounding Event: Trauma Comorbid Type II Diabetes Date Acquired: 08/03/2018 History: Weeks Of Treatment: 0 Clustered Wound: No Photos Wound Measurements Length: (cm) 2.2 Width: (cm) 1.7 Depth: (cm) 0.8 Area: (cm) 2.937 Volume: (cm) 2.35 % Reduction in Area: 0% % Reduction in Volume: 0% Epithelialization: None Tunneling: No Undermining: Yes Starting Position (o'clock): 12 Ending Position (o'clock): 12 Maximum Distance: (cm) 2.6 Wound Description Classification: Grade 2 Foul Odor Wound Margin: Well defined, not attached Slough/Fib Exudate Amount: Medium Exudate Type: Serosanguineous Exudate Color: red, brown Wound Bed Granulation Amount: Large (67-100%) Granulation Quality: Red, Pink Fascia Expo Necrotic Amount: Small (1-33%) Fat Layer ( Necrotic Quality: Adherent Slough Tendon Expo Muscle Expo Joint Expos Bone Expose After Cleansing: No rino Yes Exposed Structure sed: No Subcutaneous Tissue) Exposed: Yes sed: No sed: No ed: No d: No Electronic Signature(s) Signed: 01/14/2019 3:43:33 PM By: [name] EMT/HBOT Signed: 03/12/2019 2:59:38 PM By: Carlene Coria RN Entered By: [name] on 01/14/2019 09:18:47 -------------------------------------------------------------------------------- Vitals Details Patient Name: Date of  Service: [place], [name] 01/11/2019 2:45 PM Medical Record [id number] Patient Account Number: [id number] Date of Birth/Sex: Treating RN: 03-06-1986 (33 y.o. Staci Acosta, [place] Primary Care Oluwatimilehin Balfour: [name] Other Clinician: Referring Kashauna Celmer: Treating Erla Bacchi/Extender:Robson, [name], Ashby Dawes Weeks in Treatment: 0 Vital Signs Time Taken: 15:45 Temperature (F): 98.3 Height (in): 77 Pulse (bpm): 97 Source: Stated Respiratory Rate (breaths/min): 16 Weight (lbs): 260 Blood Pressure (mmHg): 117/86 Source: Stated Reference Range: 80 - 120 mg / dl Body Mass Index (BMI): 30.8 Electronic Signature(s) Signed: 03/12/2019 2:59:38 PM By: Carlene Coria RN Entered By: Carlene Coria on 01/11/2019 15:46:05

## 2019-03-12 NOTE — Progress Notes (Signed)
[place], [name] ([id number]) Visit Report for 01/11/2019 Chief Complaint Document Details Patient Name: Date of Service: [place], [name] 01/11/2019 2:45 PM Medical Record Number:[id number] Patient Account Number: [id number] Date of Birth/Sex: Treating RN: 26-Mar-1986 (33 y.o. [name] Primary Care Provider: Katy Apo Other Clinician: Referring Provider: Treating Provider/Extender:Samiya Mervin, Almira Coaster, Chalmers Guest in Treatment: 0 Information Obtained from: Patient Chief Complaint 01/11/2019; patient is here for review of a rather substantial wound over the left fifth plantar metatarsal head extending into the lateral part of his foot Electronic Signature(s) Signed: 01/13/2019 10:27:00 AM By: Baltazar Najjar MD Entered By: Baltazar Najjar on 01/11/2019 17:49:51 -------------------------------------------------------------------------------- HPI Details Patient Name: Date of Service: [place], [name] 01/11/2019 2:45 PM Medical Record Number:[id number] Patient Account Number: [id number] Date of Birth/Sex: Treating RN: 1985/08/09 (33 y.o. 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 He comes in for review of a wound over the plantar fifth metatarsal head extending into the lateral part of the foot. He was followed for this previously by his podiatrist Dr. Darreld Mclean. As the patient tells his story he went to see podiatry first for a swelling he developed on the lateral part of his fifth metatarsal head in May. He states this was "open" by podiatry and the area closed. He was followed up in June and it was again opened callus removed and it closed promptly. There were plans being made for surgery on  the fifth metatarsal head in June however his blood sugar was apparently too high for anesthesia. Apparently the area was debrided and opened again in June and it is never closed since. Looking over the records from podiatry I am really not able to follow this. It was clear when he was first seen it was before 5/14 at that point he already had a wound. By 5/17 the ulcer was resolved. I do not see anything about a procedure. On 5/28 noted to have pre-ulcerative moderate keratosis. X-ray noted 1/5 contracted toe and tailor's bunion and metatarsal deformity. On a visit date on 09/28/2018 the dorsal part of the left foot it healed and resolved. There was concern about swelling in his lower extremity he was sent to the ER.. As far as I can tell he was seen in the ER on 7/12 with an ulcer on his left foot. A DVT rule out of the left leg was negative. I do not think I have complete records from podiatry but I am not able to verify the procedures this patient states he had. He states after the last procedure the wound has never closed although I am not able to follow this in the records I have from podiatry. He has not had a recent x-ray The patient has been using Neosporin on the wound. He is wearing a Darco shoe. He is still very active up on his foot working and exercising. Past medical history; type 2 diabetes ketosis-prone, leg swelling with a negative DVT study in July. Non-smoker ABI in our clinic was 0.85 on the left Electronic Signature(s) Signed: 01/13/2019 10:27:00 AM By: Baltazar Najjar MD Entered By: Baltazar Najjar on 01/11/2019 18:02:42 -------------------------------------------------------------------------------- Physical Exam Details Patient Name: Date of Service: [place], [name] 01/11/2019 2:45 PM Medical Record Number:[id number] Patient Account Number: [id number] Date of Birth/Sex: Treating RN: [birth year] (33 y.o. Daphane Shepherd,  [place] Primary Care Provider: Katy Apo Other  Clinician: Referring Provider: Treating Provider/Extender:Jaiveon Suppes, Almira Coaster, Deirdre Peer Weeks in Treatment: 0 Constitutional Sitting or standing Blood Pressure is within target range for patient.. Pulse regular and within target range for patient.Marland Kitchen Respirations regular, non-labored and within target range.. Temperature is normal and within the target range for the patient.Marland Kitchen Appears in no distress. Respiratory work of breathing is normal. Bilateral breath sounds are clear and equal in all lobes with no wheezes, rales or rhonchi.. Cardiovascular Heart rhythm and rate regular, without murmur or gallop.. Pedal pulses are palpable. There is nonpitting edema of his left calf down to the level of his ankle. This is not tender there is no warmth.. Lymphatic None palpable in the popliteal area. Musculoskeletal The left fifth metatarsal phalangeal joint seems somewhat subluxed. Integumentary (Hair, Skin) No skin issues are seen. Neurological Really insensate in the plantar aspect of his left foot to both light touch and vibration. Psychiatric appears at normal baseline. Notes Wound exam; there is a fairly substantial wound on the plantar aspect of the fifth metatarsal head. This tunnels laterally around the lateral part of his foot substantially almost over the fifth metatarsal head dorsally. There is no purulent drainage. There is no exposed bone. No debridement was done today. Electronic Signature(s) Signed: 01/13/2019 10:27:00 AM By: Baltazar Najjar MD Entered By: Baltazar Najjar on 01/11/2019 18:03:28 -------------------------------------------------------------------------------- Physician Orders Details Patient Name: Date of Service: [place], [name] 01/11/2019 2:45 PM Medical Record Number:[id number] Patient Account Number: [id number] Date of Birth/Sex: Treating RN: 1986-01-29 (33 y.o. [name] Primary Care Provider: Katy Apo Other Clinician: Referring Provider:  Treating Provider/Extender:Dwight Adamczak, Almira Coaster, Chalmers Guest in Treatment: 0 Verbal / Phone Orders: No Diagnosis Coding Follow-up Appointments Return Appointment in 1 week. Dressing Change Frequency Wound #1 Left Metatarsal head fifth Change dressing every day. Wound Cleansing Wound #1 Left Metatarsal head fifth May shower and wash wound with soap and water. Primary Wound Dressing Wound #1 Left Metatarsal head fifth Calcium Alginate with Silver Secondary Dressing Wound #1 Left Metatarsal head fifth Kerlix/Rolled Gauze Dry Gauze Other: - felt callous pad Off-Loading Wedge shoe to: - front offloading darco to left foot Radiology X-ray, left foot with emphasis on fifth metatarsal head - Non healing ulcer on left fifth metatarsal head. ICD-10: E11.621 Electronic Signature(s) Signed: 01/13/2019 10:27:00 AM By: Baltazar Najjar MD Signed: 01/15/2019 5:52:09 PM By: Zandra Abts RN, BSN Entered By: Zandra Abts on 01/11/2019 17:13:51 -------------------------------------------------------------------------------- Prescription 01/11/2019 Patient Name: [place], [name] Provider: Baltazar Najjar MD Date of Birth: 08-25-1985 NPI#: [id number] Sex: [name] DEA#: [id number] Phone #: [phone] License #: [id number] Patient Address: Eligha Brideg[facility] [address] [place], [place] [address] [address] [place], [place] [address] [phone] Allergies metformin Provider's Orders X-ray, left foot with emphasis on fifth metatarsal head - Non healing ulcer on left fifth metatarsal head. ICD-10: E11.621 Signature(s): Date(s): Electronic Signature(s) Signed: 01/13/2019 10:27:00 AM By: Baltazar Najjar MD Signed: 01/15/2019 5:52:09 PM By: Zandra Abts RN, BSN Entered By: Zandra Abts on 01/11/2019 17:13:51 --------------------------------------------------------------------------------  Problem List Details Patient Name: Date of  Service: [place], [name] 01/11/2019 2:45 PM Medical Record Number:[id number] Patient Account Number: [id number] Date of Birth/Sex: Treating RN: 10-03-1985 (33 y.o. [name] Primary Care Provider: Katy Apo Other Clinician: Referring Provider: Treating Provider/Extender:Carlosdaniel Grob, Almira Coaster, Chalmers Guest in Treatment: 0 Active Problems ICD-10 Evaluated Encounter Code Description Active Date Today Diagnosis E11.621 Type 2 diabetes mellitus with  foot ulcer 01/11/2019 No Yes L97.521 Non-pressure chronic ulcer of other part of left foot 01/11/2019 No Yes limited to breakdown of skin E11.42 Type 2 diabetes mellitus with diabetic polyneuropathy 01/11/2019 No Yes Inactive Problems Resolved Problems Electronic Signature(s) Signed: 01/13/2019 10:27:00 AM By: Baltazar Najjarobson, Said Rueb MD Entered By: Baltazar Najjarobson, Trustin Chapa on 01/11/2019 17:44:44 -------------------------------------------------------------------------------- Progress Note Details Patient Name: Date of Service: [name] 01/11/2019 2:45 PM Medical Record [id number] Patient Account Number: [id number] Date of Birth/Sex: Treating RN: 12-01-1985 (33 y.o. 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 He comes in for review of a wound over the plantar fifth metatarsal head extending into the lateral part of the foot. He was followed for this previously by his podiatrist Dr. Darreld McleanAjiouny. As the patient tells his story he went to see podiatry  first for a swelling he developed on the lateral part of his fifth metatarsal head in May. He states this was "open" by podiatry and the area closed. He was followed up in June and it was again opened callus removed and it closed promptly. There were plans being made for surgery on the fifth metatarsal head in June however his blood sugar was apparently too high for anesthesia. Apparently the area was debrided and opened again in June and it is never closed since. Looking over the records from podiatry I am really not able to follow this. It was clear when he was first seen it was before 5/14 at that point he already had a wound. By 5/17 the ulcer was resolved. I do not see anything about a procedure. On 5/28 noted to have pre-ulcerative moderate keratosis. X-ray noted 1/5 contracted toe and tailor's bunion and metatarsal deformity. On a visit date on 09/28/2018 the dorsal part of the left foot it healed and resolved. There was concern about swelling in his lower extremity he was sent to the ER.. As far as I can tell he was seen in the ER on 7/12 with an ulcer on his left foot. A DVT rule out of the left leg was negative. I do not think I have complete records from podiatry but I am not able to verify the procedures this patient states he had. He states after the last procedure the wound has never closed although I am not able to follow this in the records I have from podiatry. He has not had a recent x-ray The patient has been using Neosporin on the wound. He is wearing a Darco shoe. He is still very active up on his foot working and exercising. Past medical history; type 2 diabetes ketosis-prone, leg swelling with a negative DVT study in July. Non-smoker ABI in our clinic was 0.85 on the left Patient History Information obtained from Patient. Allergies metformin Family History No family history of Cancer, Diabetes, Hereditary Spherocytosis, Hypertension, Kidney Disease, Lung  Disease, Seizures, Stroke, Thyroid Problems, Tuberculosis. Social History Never smoker, Marital Status - Single, Alcohol Use - Rarely, Drug Use - No History, Caffeine Use - Never. Medical History Eyes Denies history of Cataracts, Glaucoma, Optic Neuritis Ear/Nose/Mouth/Throat Denies history of Chronic sinus problems/congestion, Middle  ear problems Hematologic/Lymphatic Denies history of Anemia, Hemophilia, Human Immunodeficiency Virus, Lymphedema, Sickle Cell Disease Respiratory Denies history of Aspiration, Asthma, Chronic Obstructive Pulmonary Disease (COPD), Pneumothorax, Sleep Apnea, Tuberculosis Cardiovascular Denies history of Angina, Arrhythmia, Congestive Heart Failure, Coronary Artery Disease, Deep Vein Thrombosis, Hypertension, Hypotension, Myocardial Infarction, Peripheral Arterial Disease, Peripheral Venous Disease, Phlebitis, Vasculitis Gastrointestinal Denies history of Cirrhosis , Colitis, Crohnoos, Hepatitis A, Hepatitis B, Hepatitis C Endocrine Patient has history of Type II Diabetes Denies history of Type I Diabetes Immunological Denies history of Lupus Erythematosus, Raynaudoos, Scleroderma Integumentary (Skin) Denies history of History of Burn Musculoskeletal Denies history of Gout, Rheumatoid Arthritis, Osteoarthritis, Osteomyelitis Neurologic Denies history of Dementia, Neuropathy, Quadriplegia, Paraplegia, Seizure Disorder Oncologic Denies history of Received Chemotherapy, Received Radiation Psychiatric Denies history of Anorexia/bulimia, Confinement Anxiety Patient is treated with Insulin. Blood sugar is not tested. Review of Systems (ROS) Constitutional Symptoms (General Health) Denies complaints or symptoms of Fatigue, Fever, Chills, Marked Weight Change. Eyes Denies complaints or symptoms of Dry Eyes, Vision Changes, Glasses / Contacts. Ear/Nose/Mouth/Throat Denies complaints or symptoms of Chronic sinus problems or rhinitis. Respiratory Denies  complaints or symptoms of Chronic or frequent coughs, Shortness of Breath. Cardiovascular Denies complaints or symptoms of Chest pain. Gastrointestinal Denies complaints or symptoms of Frequent diarrhea, Nausea, Vomiting. Endocrine Denies complaints or symptoms of Heat/cold intolerance. Genitourinary Denies complaints or symptoms of Frequent urination. Integumentary (Skin) Complains or has symptoms of Wounds. Musculoskeletal Denies complaints or symptoms of Muscle Pain, Muscle Weakness. Neurologic Denies complaints or symptoms of Numbness/parasthesias. Psychiatric Denies complaints or symptoms of Claustrophobia, Suicidal. Objective Constitutional Sitting or standing Blood Pressure is within target range for patient.. Pulse regular and within target range for patient.Marland Kitchen Respirations regular, non-labored and within target range.. Temperature is normal and within the target range for the patient.Marland Kitchen Appears in no distress. Vitals Time Taken: 3:45 PM, Height: 77 in, Source: Stated, Weight: 260 lbs, Source: Stated, BMI: 30.8, Temperature: 98.3 F, Pulse: 97 bpm, Respiratory Rate: 16 breaths/min, Blood Pressure: 117/86 mmHg. Respiratory work of breathing is normal. Bilateral breath sounds are clear and equal in all lobes with no wheezes, rales or rhonchi.. Cardiovascular Heart rhythm and rate regular, without murmur or gallop.. Pedal pulses are palpable. There is nonpitting edema of his left calf down to the level of his ankle. This is not tender there is no warmth.. Lymphatic None palpable in the popliteal area. Musculoskeletal The left fifth metatarsal phalangeal joint seems somewhat subluxed. Neurological Really insensate in the plantar aspect of his left foot to both light touch and vibration. Psychiatric appears at normal baseline. General Notes: Wound exam; there is a fairly substantial wound on the plantar aspect of the fifth metatarsal head. This tunnels laterally around the  lateral part of his foot substantially almost over the fifth metatarsal head dorsally. There is no purulent drainage. There is no exposed bone. No debridement was done today. Integumentary (Hair, Skin) No skin issues are seen. Wound #1 status is Open. Original cause of wound was Trauma. The wound is located on the Left Metatarsal head fifth. The wound measures 2.2cm length x 1.7cm width x 0.8cm depth; 2.937cm^2 area and 2.35cm^3 volume. There is Fat Layer (Subcutaneous Tissue) Exposed exposed. There is no tunneling noted, however, there is undermining starting at 12:00 and ending at 12:00 with a maximum distance of 2.6cm. There is a medium amount of serosanguineous drainage noted. The wound margin is well defined and not attached to the wound base. There is large (67-100%) red, pink granulation within the  wound bed. There is a small (1-33%) amount of necrotic tissue within the wound bed including Adherent Slough. 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 Dressing Change Frequency: Wound #1 Left Metatarsal head fifth: Change dressing every day. Wound Cleansing: Wound #1 Left Metatarsal head fifth: May shower and wash wound with soap and water. Primary Wound Dressing: Wound #1 Left Metatarsal head fifth: Calcium Alginate with Silver Secondary Dressing: Wound #1 Left Metatarsal head fifth: Kerlix/Rolled Gauze Dry Gauze Other: - felt callous pad Off-Loading: Wedge shoe to: - front offloading darco to left foot Radiology ordered were: X-ray, left foot with emphasis on fifth metatarsal head - Non healing ulcer on left fifth metatarsal head. ICD-10: E11.621 1. The patient gives a history then I am not able to follow in the records I have available from  podiatry. It does not look like he has been seen since the end of June. Nevertheless he has a fairly substantial plantar wound on the fifth metatarsal that tunnels laterally almost to the dorsal part of his fifth metatarsal head. Nothing look particularly infected here. I did not do any culturing. 2. The patient needs a plain x-ray of this area. Concerning for underlying bone infection of in the history. He may require an MRI. Spite of this concern I did not think he required antibiotics today. 3. At some point the overhanging skin from the lateral part of this wound is going to need to be removed there is no tissue adherence here. 4. We gave him a forefoot Darco off loader. If there is no infection he will need a total contact cast 5. Whether he needs surgery ultimately on the fifth metatarsal head will depend on whether he has the ability to heal this wound and prevent recurrence. I will look at the x-ray 6. I think the patient has the beginnings of lymphedema in the left leg. I am not exactly sure why that would be occurring. He has had a negative DVT rule out study in July. Electronic Signature(s) Signed: 01/13/2019 10:27:00 AM By: Baltazar Najjar MD Entered By: Baltazar Najjar on 01/11/2019 18:07:51 -------------------------------------------------------------------------------- HxROS Details Patient Name: Date of Service: [place], [name] 01/11/2019 2:45 PM Medical Record Number:[id number] Patient Account Number: [id number] Date of Birth/Sex: Treating RN: 31-Jan-1986 (33 y.o. [name] Primary Care Provider: Katy Apo Other Clinician: Referring Provider: Treating Provider/Extender:Karizma Cheek, Almira Coaster, Chalmers Guest in Treatment: 0 Information Obtained From Patient Constitutional Symptoms (General Health) Complaints and Symptoms: Negative for: Fatigue; Fever; Chills; Marked Weight Change Eyes Complaints and Symptoms: Negative for: Dry Eyes; Vision Changes; Glasses  / Contacts Medical History: Negative for: Cataracts; Glaucoma; Optic Neuritis Ear/Nose/Mouth/Throat Complaints and Symptoms: Negative for: Chronic sinus problems or rhinitis Medical History: Negative for: Chronic sinus problems/congestion; Middle ear problems Respiratory Complaints and Symptoms: Negative for: Chronic or frequent coughs; Shortness of Breath Medical History: Negative for: Aspiration; Asthma; Chronic Obstructive Pulmonary Disease (COPD); Pneumothorax; Sleep Apnea; Tuberculosis Cardiovascular Complaints and Symptoms: Negative for: Chest pain Medical History: Negative for: Angina; Arrhythmia; Congestive Heart Failure; Coronary Artery Disease; Deep Vein Thrombosis; Hypertension; Hypotension; Myocardial Infarction; Peripheral Arterial Disease; Peripheral Venous Disease; Phlebitis; Vasculitis Gastrointestinal Complaints and Symptoms: Negative for: Frequent diarrhea; Nausea; Vomiting Medical History: Negative for: Cirrhosis ; Colitis; Crohns; Hepatitis A; Hepatitis B; Hepatitis C Endocrine Complaints and Symptoms: Negative for: Heat/cold intolerance  Medical History: Positive for: Type II Diabetes Negative for: Type I Diabetes Time with diabetes: 8 Treated with: Insulin Blood sugar tested every day: No Genitourinary Complaints and Symptoms: Negative for: Frequent urination Integumentary (Skin) Complaints and Symptoms: Positive for: Wounds Medical History: Negative for: History of Burn Musculoskeletal Complaints and Symptoms: Negative for: Muscle Pain; Muscle Weakness Medical History: Negative for: Gout; Rheumatoid Arthritis; Osteoarthritis; Osteomyelitis Neurologic Complaints and Symptoms: Negative for: Numbness/parasthesias Medical History: Negative for: Dementia; Neuropathy; Quadriplegia; Paraplegia; Seizure Disorder Psychiatric Complaints and Symptoms: Negative for: Claustrophobia; Suicidal Medical History: Negative for: Anorexia/bulimia; Confinement  Anxiety Hematologic/Lymphatic Medical History: Negative for: Anemia; Hemophilia; Human Immunodeficiency Virus; Lymphedema; Sickle Cell Disease Immunological Medical History: Negative for: Lupus Erythematosus; Raynauds; Scleroderma Oncologic Medical History: Negative for: Received Chemotherapy; Received Radiation Immunizations Pneumococcal Vaccine: Received Pneumococcal Vaccination: No Implantable Devices None Family and Social History Cancer: No; Diabetes: No; Hereditary Spherocytosis: No; Hypertension: No; Kidney Disease: No; Lung Disease: No; Seizures: No; Stroke: No; Thyroid Problems: No; Tuberculosis: No; Never smoker; Marital Status - Single; Alcohol Use: Rarely; Drug Use: No History; Caffeine Use: Never; Financial Concerns: No; Food, Clothing or Shelter Needs: No; Support System Lacking: No; Transportation Concerns: No Electronic Signature(s) Signed: 01/13/2019 10:27:00 AM By: Baltazar Najjar MD Signed: 03/12/2019 2:59:38 PM By: Yevonne Pax RN Entered By: Yevonne Pax on 01/11/2019 15:56:45 -------------------------------------------------------------------------------- SuperBill Details Patient Name: Date of Service: [place], [name] 01/11/2019 Medical Record Number:[id number] Patient Account Number: [id number] Date of Birth/Sex: Treating RN: [birth year] (33 y.o. 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

## 2019-03-12 NOTE — Progress Notes (Signed)
[name], [name] ([id number]) Visit Report for 03/12/2019 SuperBill Details Patient Name: Date of Service: [name], [name] 03/12/2019 Medical Record [id number] Patient Account Number: [id number] Date of Birth/Sex: Treating RN: Jul 01, 1985 (33 y.o. 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

## 2019-03-12 NOTE — Progress Notes (Signed)
[place]ep[place], [name] ([id number]) Visit Report for 03/12/2019 Arrival Information Details Patient Name: Date of Service: [place], [name] 03/12/2019 10:00 AM Medical Record [id number] Patient Account Number: [id number] Date of Birth/Sex: Treating RN: 02/15/1986 (33 y.o. M) Primary Care Jamaul Heist: [name] Other Clinician: [name] Referring Tishie Altmann: Treating Diannie Willner/Extender:Robson, [name], [name]: 8 Visit Information History Since Last Visit Added or deleted any medications: No Patient Arrived: Ambulatory Any new allergies or adverse reactions: No Arrival Time: 10:10 Had a fall or experienced change in No Accompanied By: self activities of daily living that may affect Transfer Assistance: None risk of falls: Patient Identification Verified: Yes Signs or symptoms of abuse/neglect since last No Secondary Verification Process Yes visito Completed: Hospitalized since last visit: No Patient Requires Transmission-Based No Implantable device outside of the clinic excluding No Precautions: cellular tissue based products placed in the center Patient Has Alerts: No since last visit: Pain Present Now: No Electronic Signature(s) Signed: 03/12/2019 4:34:42 PM By: [name] EMT/HBOT Entered By: [name] on 03/12/2019 11:07:24 -------------------------------------------------------------------------------- Encounter Discharge Information Details Patient Name: Date of Service: [place], [name] 03/12/2019 10:00 AM Medical Record [id number] Patient Account Number: [id number] Date of Birth/Sex: Treating RN: Mar 26, 1986 (33 y.o. M) Primary Care Daisean Brodhead: [name] Other Clinician: [name] Referring Kashis Penley: Treating Mylynn Dinh/Extender:Robson, [name], [name]: 8 Encounter Discharge Information Items Discharge Condition: Stable Ambulatory Status: Ambulatory Discharge Destination:  Home Transportation: Private Auto Accompanied By: self Schedule Follow-up Appointment: Yes Clinical Summary of Care: Patient Declined Electronic Signature(s) Signed: 03/12/2019 4:34:42 PM By: [name] EMT/HBOT Entered By: [name] on 03/12/2019 16:25:59 -------------------------------------------------------------------------------- Patient/Caregiver Education Details Patient Name: [place], [name] 12/8/2020andnbsp10:00 Date of Service: AM Medical Record [id number] Number: Patient Account Number: [id number] Treating RN: 03-26-1986 (33 y.o. Date of Birth/Gender: M) Other Clinician: [name] Primary Care Physician: [name] Treating [name] Referring Physician: Physician/Extender: Smith Mince in Treatment: 8 Education Assessment Education Provided To: Patient Education Topics Provided Hyperbaric Oxygenation: Methods: Explain/Verbal Responses: State content correctly Electronic Signature(s) Signed: 03/12/2019 4:34:42 PM By: [name] EMT/HBOT Entered By: [name] on 03/12/2019 16:25:45 -------------------------------------------------------------------------------- Vitals Details Patient Name: Date of Service: [place], [name] 03/12/2019 10:00 AM Medical Record [id number] Patient Account Number: [id number] Date of Birth/Sex: Treating RN: 1985/10/25 (33 y.o. M) Primary Care Cristianna Cyr: [name] Other Clinician: [name] Referring Myrta Mercer: Treating Natividad Halls/Extender:Robson, [name], [name]: 8 Vital Signs Time Taken: 10:15 Temperature (F): 98 Height (in): 77 Pulse (bpm): 104 Weight (lbs): 260 Respiratory Rate (breaths/min): 16 Body Mass Index (BMI): 30.8 Blood Pressure (mmHg): 144/87 Capillary Blood Glucose (mg/dl): 195 Reference Range: 80 - 120 mg / dl Electronic Signature(s) Signed: 03/12/2019 4:34:42 PM By: [name] EMT/HBOT Entered By: [name] on  03/12/2019 11:07:43

## 2019-03-12 NOTE — Progress Notes (Signed)
[name], [name] ([id number]) Visit Report for 02/26/2019 Chief Complaint Document Details Patient Name: Date of Service: Palazzo, [name] 02/26/2019 10:30 AM Medical Record [id number] Patient Account Number: [id number] Date of Birth/Sex: Treating RN: [birth year] (33 y.o. M) Primary Care Provider: [name] Other Clinician: Referring Provider: Treating Provider/Extender:Stone III, Newell Coral, [name]: 6 Information Obtained from: Patient Chief Complaint 01/11/2019; patient is here for review of a rather substantial wound over the left fifth plantar metatarsal head extending into the lateral part of his foot Electronic Signature(s) Signed: 02/26/2019 10:29:27 PM By: Worthy Keeler PA-C Entered By: Worthy Keeler on 02/26/2019 10:55:08 -------------------------------------------------------------------------------- Debridement Details Patient Name: Date of Service: [name] 02/26/2019 10:30 AM Medical Record [id number] Patient Account Number: [id number] Date of Birth/Sex: 1986-01-14 (33 y.o. M) Treating RN: Carlene Coria Primary Care Provider: [name] Other Clinician: Referring Provider: Treating Provider/Extender:Stone III, Newell Coral, Ashby Dawes Weeks in Treatment: 6 Debridement Performed for Wound #1 Left Metatarsal head fifth Assessment: Performed By: Physician Worthy Keeler, PA Debridement Type: Debridement Severity of Tissue Pre Fat layer exposed Debridement: Level of Consciousness (Pre- Awake and Alert procedure): Pre-procedure Verification/Time Out Taken: Yes - 11:16 Start Time: 11:16 Pain Control: Lidocaine 5% topical ointment Total Area Debrided (L x W): 2.4 (cm) x 2.5 (cm) = 6 (cm) Tissue and other material Viable, Non-Viable, Callus, Subcutaneous, Skin: Dermis , Skin: Epidermis Viable, Non-Viable, Callus, Subcutaneous, Skin: Dermis , Skin: Epidermis debrided: Level: Skin/Subcutaneous Tissue Debridement  Description: Excisional Instrument: Curette Bleeding: Moderate Hemostasis Achieved: Pressure End Time: 11:24 Procedural Pain: 0 Post Procedural Pain: 0 Response to Treatment: Procedure was tolerated well Level of Consciousness Awake and Alert (Post-procedure): Post Debridement Measurements of Total Wound Length: (cm) 2.4 Width: (cm) 2.5 Depth: (cm) 0.9 Volume: (cm) 4.241 Character of Wound/Ulcer Post Improved Debridement: Severity of Tissue Post Debridement: Fat layer exposed Post Procedure Diagnosis Same as Pre-procedure Electronic Signature(s) Signed: 02/26/2019 10:29:27 PM By: Worthy Keeler PA-C Signed: 03/12/2019 2:55:30 PM By: Carlene Coria RN Entered By: Carlene Coria on 02/26/2019 11:24:47 -------------------------------------------------------------------------------- HPI Details Patient Name: Date of Service: [name] 02/26/2019 10:30 AM Medical Record [id number] Patient Account Number: [id number] Date of Birth/Sex: Treating RN: Oct 11, 1985 (33 y.o. 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 He comes in for review of a wound over the plantar fifth metatarsal head extending into the lateral part of the foot. He was followed for this previously by his podiatrist Dr. Cornelius Moras. As the patient tells his story he went to see podiatry first for a swelling he developed on the lateral part of his fifth metatarsal head in May. He states this was "open" by podiatry and the area closed. He was followed up in June and it was again opened callus removed and it closed promptly. There were plans being made for surgery on the fifth metatarsal head in June however his blood sugar was apparently too high for  anesthesia. Apparently the area was debrided and opened again in June and it is never closed since. Looking over the records from podiatry I am really not able to follow this. It was clear when he was first seen it was before 5/14 at that point he already had a wound. By 5/17 the ulcer was resolved. I do not see anything  about a procedure. On 5/28 noted to have pre-ulcerative moderate keratosis. X-ray noted 1/5 contracted toe and tailor's bunion and metatarsal deformity. On a visit date on 09/28/2018 the dorsal part of the left foot it healed and resolved. There was concern about swelling in his lower extremity he was sent to the ER.. As far as I can tell he was seen in the ER on 7/12 with an ulcer on his left foot. A DVT rule out of the left leg was negative. I do not think I have complete records from podiatry but I am not able to verify the procedures this patient states he had. He states after the last procedure the wound has never closed although I am not able to follow this in the records I have from podiatry. He has not had a recent x-ray The patient has been using Neosporin on the wound. He is wearing a Darco shoe. He is still very active up on his foot working and exercising. Past medical history; type 2 diabetes ketosis-prone, leg swelling with a negative DVT study in July. Non-smoker ABI in our clinic was 0.85 on the left 10/16; substantial wound on the plantar left fifth met head extending laterally almost to the dorsal fifth MTP. We have been using silver alginate we gave him a Darco forefoot off loader. An x-ray did not show evidence of osteomyelitis did note soft tissue emphysema which I think was due to gas tracking through an open wound. There is no doubt in my mind he requires an MRI 10/23; MRI not booked until 3 November at the earliest this is largely due to his glucose sensor in the right arm. We have been using silver alginate. There has been an improvement 10/29; I am still  not exactly sure when his MRI is booked for. He says it is the third but it is the 10th in epic. This definitely needs to be done. He is running a low-grade fever today but no other symptoms. No real improvement in the 1 02/26/2019 patient presents today for a follow-up visit here in our clinic he is last been seen in the clinic on October 29. Subsequently we were working on getting MRI to evaluate and see what exactly was going on and where we would need to go from the standpoint of whether or not he had osteomyelitis and again what treatments were going be required. Subsequently the patient ended up being admitted to the hospital on 02/07/2019 and was discharged on 02/14/2019. This is a somewhat interesting admission with a discharge diagnosis of pneumonia due to COVID-19 although he was positive for COVID-19 when tested at the urgent care but negative x2 when he was actually in the hospital. With that being said he did have acute respiratory failure with hypoxia and it was noted he also have a left foot ulceration with osteomyelitis. With that being said he did require oxygen for his pneumonia and I level 4 L. He was placed on antivirals and steroids for the COVID-19. He was also transferred to the [place] at one point. Nonetheless he did subsequently discharged home and since being home has done much better in that regard. The CT angiogram did not show any pulmonary embolism. With regard to the osteomyelitis the patient was placed on vancomycin and Zosyn while in the hospital but has been changed to Augmentin at discharge. It was also recommended that he follow-up with wound care and podiatry. Podiatry however wanted him to see [name] according to the  patient prior to them doing anything further. His hemoglobin A1c was 9.9 as noted in the hospital. Have an MRI of the left foot performed while in the hospital on 02/04/2019. 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 There is an overlying plantar open wound noted an abscess tracking back along the lateral aspect of the fifth metatarsal shaft. There is otherwise diffuse cellulitis and mild fasciitis without findings of polymyositis. The patient did have recently pneumonia secondary to COVID-19 I looked in the chart through epic and it does appear that the patient may need to have an additional x-ray just to ensure everything is cleared and that he has no airspace disease prior to putting him into the chamber. Electronic Signature(s) Signed: 02/27/2019 4:52:33 PM By: Worthy Keeler PA-C Entered By: Worthy Keeler on 02/27/2019 11:54:25 -------------------------------------------------------------------------------- Physical Exam Details Patient Name: Date of Service: Kuper, [name] 02/26/2019 10:30 AM Medical Record [id number] Patient Account Number: [id number] Date of Birth/Sex: Treating RN: [birth year] (33 y.o. M) Primary Care Provider: [name] Other Clinician: Referring Provider: Treating Provider/Extender:Stone III, Newell Coral, Ashby Dawes Weeks in Treatment: 6 Constitutional Well-nourished and well-hydrated in no acute distress. Respiratory normal breathing without difficulty. clear to auscultation bilaterally. Cardiovascular regular rate and rhythm with normal S1, S2. Psychiatric this patient is able to make decisions and demonstrates good insight into disease process. Alert and Oriented x 3. pleasant and cooperative. Notes Upon inspection today patient's wound actually showed signs of some fairly good granulation at this time which is good news. Fortunately there is no signs of active infection systemically at this point although obviously he has been very sick towards the beginning of this week. I am get a recommend that he likely needs to see infectious disease also believe that he will need to  consider hyperbaric oxygen therapy as a part of his treatment regimen if were going to add things to heal he did have significant findings on his MRI. He wants to try to avoid any surgery that is if at all possible. Electronic Signature(s) Signed: 02/27/2019 4:52:33 PM By: Worthy Keeler PA-C Entered By: Worthy Keeler on 02/27/2019 11:55:05 -------------------------------------------------------------------------------- Physician Orders Details Patient Name: Date of Service: Kuznicki, [name] 02/26/2019 10:30 AM Medical Record [id number] Patient Account Number: [id number] Date of Birth/Sex: Treating RN: [birth year] (33 y.o. 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 Dressing Change Frequency Wound #1 Left Metatarsal head fifth Change dressing every day. Skin Barriers/Peri-Wound Care Moisturizing lotion - to both legs daily. Wound Cleansing Wound #1 Left Metatarsal head fifth May shower and wash wound with soap and water. Primary Wound Dressing Wound #1 Left Metatarsal head fifth Calcium Alginate with Silver Secondary Dressing Wound #1 Left Metatarsal head fifth Kerlix/Rolled Gauze Dry Gauze Other: - felt callous pad Off-Loading Wedge shoe to: - front offloading darco to left foot Hyperbaric Oxygen Therapy Wound #1 Left Metatarsal head fifth Evaluate for HBO Therapy Indication: - osteomylitis left foot If appropriate for treatment, begin HBOT per protocol: 2.0 ATA for 90 Minutes without Air Breaks Total  Number  of Treatments: - 40 One treatments per day (delivered Monday through Friday unless otherwise specified in Special Instructions below): Finger stick Blood Glucose Pre- and Post- HBOT Treatment. Follow Hyperbaric Oxygen Glycemia Protocol Consults Infectious Disease - osteomylitis left foot - (ICD10 L97.521 - Non-pressure chronic ulcer of other part of left foot limited to breakdown of skin) Radiology X-ray, Chest - hyberbaric pre workup - (ICD10 E11.621 - Type 2 diabetes mellitus with foot ulcer) GLYCEMIA INTERVENTIONS PROTOCOL PRE-HBO GLYCEMIA INTERVENTIONS ACTION INTERVENTION Obtain pre-HBO capillary blood 1 glucose (ensure physician order is in chart). A. Notify HBO physician and await physician orders. 2 If result is 70 mg/dl or below: B. If the result meets the hospital definition of a critical result, follow hospital policy. A. Give patient an 8 ounce Glucerna Shake, an 8 ounce Ensure, or 8 ounces of a Glucerna/Ensure equivalent dietary supplement*. B. Wait 30 minutes. C. Retest patients capillary blood If result is 71 mg/dl to 130 mg/dl: glucose (CBG). D. If result greater than or equal to 110 mg/dl, proceed with HBO. If result less than 110 mg/dl, notify HBO physician and consider holding HBO. If result is 131 mg/dl to 249 mg/dl: A. Proceed with HBO. A. Notify HBO physician and await physician orders. B. It is recommended to hold HBO If result is 250 mg/dl or greater: and do blood/urine ketone testing. C. If the result meets the hospital definition of a critical result, follow hospital policy. POST-HBO GLYCEMIA INTERVENTIONS ACTION INTERVENTION Obtain post HBO capillary blood 1 glucose (ensure physician order is in chart). A. Notify HBO physician and await physician orders. 2 If result is 70 mg/dl or below: B. If the result meets the hospital definition of a critical result, follow hospital policy. A. Give patient an 8 ounce Glucerna Shake, an 8 ounce  Ensure, or 8 ounces of a Glucerna/Ensure equivalent dietary supplement*. B. Wait 15 minutes for symptoms of hypoglycemia (i.e. nervousness, anxiety, sweating, chills, If result is 71 mg/dl to 100 mg/dl: clamminess, irritability, confusion, tachycardia or dizziness). C. If patient asymptomatic, discharge patient. If patient symptomatic, repeat capillary blood glucose (CBG) and notify HBO physician. If result is 101 mg/dl to 249 mg/dl: A. Discharge patient. A. Notify HBO physician and await physician orders. B. It is recommended to do If result is 250 mg/dl or greater: blood/urine ketone testing. C. If the result meets the hospital definition of a critical result, follow hospital policy. *Juice or candies are NOT equivalent products. If patient refuses the Glucerna or Ensure, please consult the hospital dietitian for an appropriate substitute. Electronic Signature(s) Signed: 02/27/2019 4:52:33 PM By: Worthy Keeler PA-C Signed: 03/12/2019 2:55:30 PM By: Carlene Coria RN Previous Signature: 02/26/2019 10:29:27 PM Version By: Worthy Keeler PA-C Entered By: Carlene Coria on 02/27/2019 14:42:46 -------------------------------------------------------------------------------- Prescription 02/26/2019 Patient Name: Scarpino, [name] Provider: Worthy Keeler PA Date of Birth: [birth year] NPI#: [id number] Sex: M DEA#: [id number] Phone #: [phone] License #: Patient Address: [facility] [address] [place], [place] [address] [place], [place] [address] [phone] Allergies metformin Provider's Orders Infectious Disease - ICD10: L97.521 - osteomylitis left foot Signature(s): Date(s): Prescription 02/26/2019 Patient Name: Bezold, [name] Provider: Worthy Keeler PA Date of Birth: 19-Feb-1986 NPI#: [id number] Sex: Jerilynn Mages DEA#: [id number] Phone #: [phone] License #: Patient Address: [facility] [address] [place], [place] [address] [place], [place] [address] [phone] Allergies metformin  Provider's Orders X-ray, Chest - ICD10: E11.621 - hyberbaric pre workup Signature(s): Date(s): Electronic Signature(s) Signed: 02/27/2019 4:52:33 PM By: Worthy Keeler PA-C Signed: 03/12/2019 2:55:30 PM By: Carlene Coria RN Previous Signature: 02/26/2019 10:29:27 PM Version By: Worthy Keeler PA-C Entered By: Carlene Coria on 02/27/2019 14:42:48 --------------------------------------------------------------------------------  Problem List Details Patient Name: Date of Service: Nikolov, [name] 02/26/2019 10:30 AM Medical Record [id number] Patient Account Number: [id number] Date of Birth/Sex: Treating RN: 07/22/1985 (33 y.o. M) Primary Care Provider: [name] Other Clinician: Referring Provider: Treating Provider/Extender:Stone III, Newell Coral, [name]: 6 Active Problems ICD-10 Evaluated Encounter Code Description Active Date Today Diagnosis E11.621 Type 2 diabetes mellitus with foot ulcer 01/11/2019 No Yes L97.521 Non-pressure chronic ulcer of other part of left foot 01/11/2019 No Yes limited to breakdown of skin E11.42 Type 2 diabetes mellitus with diabetic polyneuropathy 01/11/2019 No Yes Inactive Problems Resolved Problems Electronic Signature(s) Signed: 02/26/2019 10:29:27 PM By: Worthy Keeler PA-C Signed: 03/12/2019 2:55:30 PM By: Carlene Coria RN Entered By: Carlene Coria on 02/26/2019 10:55:43 -------------------------------------------------------------------------------- Progress Note Details Patient Name: Date of Service: Mabin, [name] 02/26/2019 10:30 AM Medical Record [id number] Patient Account Number: [id number] Date of Birth/Sex: Treating RN: 1985-11-23 (33 y.o. 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 He comes in for review of a wound over the plantar fifth metatarsal head extending into the lateral part of the foot. He was followed for this previously by his podiatrist Dr. Cornelius Moras. As the patient tells his story he went to see podiatry first for a swelling he developed on the lateral part of his fifth metatarsal head in May. He states this was "open" by podiatry and the area closed. He was followed up in June and it was again opened callus removed and it closed promptly. There were plans being made for surgery on the fifth metatarsal head in June however his blood sugar was apparently too high for anesthesia. Apparently the area was debrided and opened again in June and it is never closed since. Looking over the records from podiatry I am really not able to follow this. It was clear when he was first seen it was before 5/14 at that point he already had a wound. By 5/17 the ulcer was resolved. I do not see anything about a procedure. On 5/28 noted to have pre-ulcerative moderate keratosis. X-ray noted 1/5 contracted toe and tailor's bunion and metatarsal deformity. On a visit date on 09/28/2018 the dorsal part of the left foot it healed and resolved. There was concern about swelling in his lower extremity he was sent to the ER.. As far as I can tell he was seen in the ER on 7/12 with an ulcer on his left foot. A DVT rule out of the left leg was negative. I do not think I have complete records from podiatry but I am not able to verify the procedures this patient states he  had. He states after the last procedure the wound has never closed although I am not able to follow this in the records I have from podiatry.  He has not had a recent x-ray The patient has been using Neosporin on the wound. He is wearing a Darco shoe. He is still very active up on his foot working and exercising. Past medical history; type 2 diabetes ketosis-prone, leg swelling with a negative DVT study in July. Non-smoker ABI in our clinic was 0.85 on the left 10/16; substantial wound on the plantar left fifth met head extending laterally almost to the dorsal fifth MTP. We have been using silver alginate we gave him a Darco forefoot off loader. An x-ray did not show evidence of osteomyelitis did note soft tissue emphysema which I think was due to gas tracking through an open wound. There is no doubt in my mind he requires an MRI 10/23; MRI not booked until 3 November at the earliest this is largely due to his glucose sensor in the right arm. We have been using silver alginate. There has been an improvement 10/29; I am still not exactly sure when his MRI is booked for. He says it is the third but it is the 10th in epic. This definitely needs to be done. He is running a low-grade fever today but no other symptoms. No real improvement in the 1 02/26/2019 patient presents today for a follow-up visit here in our clinic he is last been seen in the clinic on October 29. Subsequently we were working on getting MRI to evaluate and see what exactly was going on and where we would need to go from the standpoint of whether or not he had osteomyelitis and again what treatments were going be required. Subsequently the patient ended up being admitted to the hospital on 02/07/2019 and was discharged on 02/14/2019. This is a somewhat interesting admission with a discharge diagnosis of pneumonia due to COVID-19 although he was positive for COVID-19 when tested at the urgent care but negative x2 when he was  actually in the hospital. With that being said he did have acute respiratory failure with hypoxia and it was noted he also have a left foot ulceration with osteomyelitis. With that being said he did require oxygen for his pneumonia and I level 4 L. He was placed on antivirals and steroids for the COVID-19. He was also transferred to the [place] at one point. Nonetheless he did subsequently discharged home and since being home has done much better in that regard. The CT angiogram did not show any pulmonary embolism. With regard to the osteomyelitis the patient was placed on vancomycin and Zosyn while in the hospital but has been changed to Augmentin at discharge. It was also recommended that he follow-up with wound care and podiatry. Podiatry however wanted him to see [name] according to the patient prior to them doing anything further. His hemoglobin A1c was 9.9 as noted in the hospital. Have an MRI of the left foot performed while in the hospital on 02/04/2019. 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 There is an overlying plantar open wound noted an abscess tracking back along the lateral aspect of the fifth metatarsal shaft. There is otherwise diffuse cellulitis and mild fasciitis without findings of polymyositis. The patient did have recently pneumonia secondary to COVID-19 I looked in the chart through epic and it does appear that the patient may need to have an additional x-ray just to ensure everything is cleared and that he has no airspace disease prior to putting him into the chamber.  Patient History Information obtained from Patient. Family History No family history of Cancer, Diabetes, Hereditary Spherocytosis, Hypertension, Kidney Disease, Lung Disease, Seizures, Stroke, Thyroid Problems, Tuberculosis. Social History Never smoker, Marital Status - Single, Alcohol Use - Rarely,  Drug Use - No History, Caffeine Use - Never. Medical History Eyes Denies history of Cataracts, Glaucoma, Optic Neuritis Ear/Nose/Mouth/Throat Denies history of Chronic sinus problems/congestion, Middle ear problems Hematologic/Lymphatic Denies history of Anemia, Hemophilia, Human Immunodeficiency Virus, Lymphedema, Sickle Cell Disease Respiratory Denies history of Aspiration, Asthma, Chronic Obstructive Pulmonary Disease (COPD), Pneumothorax, Sleep Apnea, Tuberculosis Cardiovascular Denies history of Angina, Arrhythmia, Congestive Heart Failure, Coronary Artery Disease, Deep Vein Thrombosis, Hypertension, Hypotension, Myocardial Infarction, Peripheral Arterial Disease, Peripheral Venous Disease, Phlebitis, Vasculitis Gastrointestinal Denies history of Cirrhosis , Colitis, Crohnoos, Hepatitis A, Hepatitis B, Hepatitis C Endocrine Patient has history of Type II Diabetes Denies history of Type I Diabetes Immunological Denies history of Lupus Erythematosus, Raynaudoos, Scleroderma Integumentary (Skin) Denies history of History of Burn Musculoskeletal Denies history of Gout, Rheumatoid Arthritis, Osteoarthritis, Osteomyelitis Neurologic Denies history of Dementia, Neuropathy, Quadriplegia, Paraplegia, Seizure Disorder Oncologic Denies history of Received Chemotherapy, Received Radiation Psychiatric Denies history of Anorexia/bulimia, Confinement Anxiety Hospitalization/Surgery History - 11/1-11/06/2018- sepsis foot infection. - 11/4-11/5 02 sats low respiratory distress. Review of Systems (ROS) Constitutional Symptoms (General Health) Denies complaints or symptoms of Fatigue, Fever, Chills, Marked Weight Change. Respiratory Denies complaints or symptoms of Chronic or frequent coughs, Shortness of Breath. Cardiovascular Denies complaints or symptoms of Chest pain. Psychiatric Denies complaints or symptoms of Claustrophobia, Suicidal. Objective Constitutional Well-nourished and  well-hydrated in no acute distress. Vitals Time Taken: 10:38 AM, Height: 77 in, Weight: 260 lbs, BMI: 30.8, Temperature: 97 F, Pulse: 112 bpm, Respiratory Rate: 18 breaths/min, Blood Pressure: 130/80 mmHg. Respiratory normal breathing without difficulty. clear to auscultation bilaterally. Cardiovascular regular rate and rhythm with normal S1, S2. Psychiatric this patient is able to make decisions and demonstrates good insight into disease process. Alert and Oriented x 3. pleasant and cooperative. General Notes: Upon inspection today patient's wound actually showed signs of some fairly good granulation at this time which is good news. Fortunately there is no signs of active infection systemically at this point although obviously he has been very sick towards the beginning of this week. I am get a recommend that he likely needs to see infectious disease also believe that he will need to consider hyperbaric oxygen therapy as a part of his treatment regimen if were going to add things to heal he did have significant findings on his MRI. He wants to try to avoid any surgery that is if at all possible. Integumentary (Hair, Skin) Wound #1 status is Open. Original cause of wound was Trauma. The wound is located on the Left Metatarsal head fifth. The wound measures 2.4cm length x 2.5cm width x 0.9cm depth; 4.712cm^2 area and 4.241cm^3 volume. There is Fat Layer (Subcutaneous Tissue) Exposed exposed. There is no tunneling noted, however, there is undermining starting at 12:00 and ending at 12:00 with a maximum distance of 1.2cm. There is a medium amount of serosanguineous drainage noted. The wound margin is well defined and not attached to the wound base. There is large (67-100%) red, pink granulation within the wound bed. There is no necrotic tissue within the wound bed. General Notes: callus to periwound. 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 There was a Excisional Skin/Subcutaneous Tissue Debridement with a total area of 6 sq cm performed by Worthy Keeler, PA. With the following instrument(s): Curette to remove Viable and Non-Viable tissue/material. Material removed includes Callus, Subcutaneous Tissue, Skin: Dermis, and Skin: Epidermis after achieving pain control using Lidocaine 5% topical ointment. No specimens were taken. A time out was conducted at 11:16, prior to the start of the procedure. A Moderate amount of bleeding was controlled with Pressure. The procedure was tolerated well with a pain level of 0 throughout and a pain level of 0 following the procedure. Post Debridement Measurements: 2.4cm length x 2.5cm width x 0.9cm depth; 4.241cm^3 volume. Character of Wound/Ulcer Post Debridement is improved. Severity of Tissue Post Debridement is: Fat layer exposed. Post procedure Diagnosis Wound #1: Same as Pre-Procedure Plan Follow-up Appointments: Return Appointment in 1 week. Dressing Change Frequency: Wound #1 Left Metatarsal head fifth: Change dressing every day. Skin Barriers/Peri-Wound Care: Moisturizing lotion - to both legs daily. Wound Cleansing: Wound #1 Left Metatarsal head fifth: May shower and wash wound with soap and water. Primary Wound Dressing: Wound #1 Left Metatarsal head fifth: Calcium Alginate with Silver Secondary Dressing: Wound #1 Left Metatarsal head fifth: Kerlix/Rolled Gauze Dry Gauze Other: - felt callous pad Off-Loading: Wedge shoe to: - front offloading darco to left foot Hyperbaric Oxygen Therapy: Wound #1 Left Metatarsal head fifth: Evaluate for HBO  Therapy Indication: - osteomylitis left foot If appropriate for treatment, begin HBOT per protocol: 2.0 ATA for 90 Minutes without Air Breaks Total Number of Treatments: - 40 One treatments per day (delivered Monday through Friday unless otherwise specified in Special Instructions below): Finger stick Blood Glucose Pre- and Post- HBOT Treatment. Follow Hyperbaric Oxygen Glycemia Protocol Consults ordered were: Infectious Disease - osteomylitis left foot Radiology ordered were: X-ray, Chest - hyberbaric pre workup 1. I would recommend currently that we go ahead and initiate treatment utilizing the silver alginate dressing which she seems to have done well with at this time. 2. I am in a suggest as well that he continue to secure this with Kerlix, dry gauze, and use a felt callus pad for offloading. He is using a front offloading shoe as well which I think is beneficial for him. 3. I am also going to suggest that we go ahead and put in the orders for hyperbaric oxygen therapy which I think could be extremely beneficial for him at this point based on MRI findings and where things stand. I think this needs to be in conjunction with infectious disease evaluation and management as well as far as the infection is concerned. 4. Parotid hyperbarics I would want to ensure that his x-ray is actually clear that everything appears to be doing okay in that regard. We will see patient back for reevaluation in 1 week here in the clinic. If anything worsens or changes patient will contact our office for additional recommendations. Electronic Signature(s) Signed: 02/27/2019 4:52:33 PM By: Worthy Keeler PA-C Entered By: Worthy Keeler on 02/27/2019 16:52:21 -------------------------------------------------------------------------------- HxROS Details Patient Name: Date of Service: Poznanski, [name] 02/26/2019 10:30 AM Medical Record [id number] Patient Account Number: [id number] Date of  Birth/Sex: Treating RN: 1985/10/02 (33 y.o. [name] Primary Care Provider: [name] Other Clinician: Referring Provider: Treating Provider/Extender:Stone III, Newell Coral, Ashby Dawes Weeks in Treatment:  6 Information Obtained From Patient Constitutional Symptoms (General Health) Complaints and Symptoms: Negative for: Fatigue; Fever; Chills; Marked Weight Change Respiratory Complaints and Symptoms: Negative for: Chronic or frequent coughs; Shortness of Breath Medical History: Negative for: Aspiration; Asthma; Chronic Obstructive Pulmonary Disease (COPD); Pneumothorax; Sleep Apnea; Tuberculosis Cardiovascular Complaints and Symptoms: Negative for: Chest pain Medical History: Negative for: Angina; Arrhythmia; Congestive Heart Failure; Coronary Artery Disease; Deep Vein Thrombosis; Hypertension; Hypotension; Myocardial Infarction; Peripheral Arterial Disease; Peripheral Venous Disease; Phlebitis; Vasculitis Psychiatric Complaints and Symptoms: Negative for: Claustrophobia; Suicidal Medical History: Negative for: Anorexia/bulimia; Confinement Anxiety Eyes Medical History: Negative for: Cataracts; Glaucoma; Optic Neuritis Ear/Nose/Mouth/Throat Medical History: Negative for: Chronic sinus problems/congestion; Middle ear problems Hematologic/Lymphatic Medical History: Negative for: Anemia; Hemophilia; Human Immunodeficiency Virus; Lymphedema; Sickle Cell Disease Gastrointestinal Medical History: Negative for: Cirrhosis ; Colitis; Crohns; Hepatitis A; Hepatitis B; Hepatitis C Endocrine Medical History: Positive for: Type II Diabetes Negative for: Type I Diabetes Time with diabetes: 8 Treated with: Insulin Blood sugar tested every day: No Immunological Medical History: Negative for: Lupus Erythematosus; Raynauds; Scleroderma Integumentary (Skin) Medical History: Negative for: History of Burn Musculoskeletal Medical History: Negative for: Gout; Rheumatoid  Arthritis; Osteoarthritis; Osteomyelitis Neurologic Medical History: Negative for: Dementia; Neuropathy; Quadriplegia; Paraplegia; Seizure Disorder Oncologic Medical History: Negative for: Received Chemotherapy; Received Radiation Immunizations Pneumococcal Vaccine: Received Pneumococcal Vaccination: No Implantable Devices None Hospitalization / Surgery History Type of Hospitalization/Surgery 11/1-11/06/2018- sepsis foot infection 11/4-11/5 02 sats low respiratory distress Family and Social History Cancer: No; Diabetes: No; Hereditary Spherocytosis: No; Hypertension: No; Kidney Disease: No; Lung Disease: No; Seizures: No; Stroke: No; Thyroid Problems: No; Tuberculosis: No; Never smoker; Marital Status - Single; Alcohol Use: Rarely; Drug Use: No History; Caffeine Use: Never; Financial Concerns: No; Food, Clothing or Shelter Needs: No; Support System Lacking: No; Transportation Concerns: No Physician Affirmation I have reviewed and agree with the above information. Electronic Signature(s) Signed: 02/27/2019 3:16:44 PM By: Deon Pilling Signed: 02/27/2019 4:52:33 PM By: Worthy Keeler PA-C Previous Signature: 02/26/2019 2:36:36 PM Version By: Deon Pilling Previous Signature: 02/26/2019 10:29:27 PM Version By: Worthy Keeler PA-C Entered By: Worthy Keeler on 02/27/2019 11:54:40 -------------------------------------------------------------------------------- SuperBill Details Patient Name: Date of Service: Peace, [name] 02/26/2019 Medical Record [id number] Patient Account Number: [id number] Date of Birth/Sex: Treating RN: [birth year] (33 y.o. Jerilynn Mages) Dolores Lory, [place] Primary Care Provider: [name] Other Clinician: Referring Provider: Treating Provider/Extender:Stone III, Newell Coral, Ashby Dawes Weeks in Treatment: 6 Diagnosis Coding ICD-10 Codes Code Description E11.621 Type 2 diabetes mellitus with foot ulcer L97.521 Non-pressure chronic ulcer of other part of left  foot limited to breakdown of skin E11.42 Type 2 diabetes mellitus with diabetic polyneuropathy Facility Procedures CPT4 Code: [id number] 1 Description: 0272 - DEB SUBQ TISSUE 20 SQ CM/< ICD-10 Diagnosis Description E11.621 Type 2 diabetes mellitus with foot ulcer Modifier: Quantity: 1 Physician Procedures CPT4 Code Description: [id number] [id number] - WC PHYS LEVEL 4 - EST PT ICD-10 Diagnosis Description E11.621 Type 2 diabetes mellitus with foot ulcer L97.521 Non-pressure chronic ulcer of other part of left foot limi E11.42 Type 2 diabetes mellitus with diabetic  polyneuropathy Modifier: 25 ted to break Quantity: 1 down of skin CPT4 Code Description: [id number] - WC PHYS SUBQ TISS 20 SQ CM ICD-10 Diagnosis Description E11.621 Type 2 diabetes mellitus with foot ulcer Modifier: Quantity: 1 Electronic Signature(s) Signed: 02/27/2019 4:52:33 PM By: Worthy Keeler PA-C Previous Signature: 02/26/2019 10:29:27 PM Version By: Worthy Keeler PA-C Entered By: Worthy Keeler on 02/27/2019 11:58:32

## 2019-03-12 NOTE — Progress Notes (Signed)
[name], Max ([id number]) Visit Report for 01/18/2019 Arrival Information Details Patient Name: Date of Service: [name], Max 01/18/2019 3:30 PM Medical Record Number:[id number] Patient Account Number: [id number] Date of Birth/Sex: Treating RN: 29-Nov-1985 (33 y.o. Judie PetitM) Yevonne PaxEpps, Carrie Primary Care Tyashia Morrisette: Katy ApoPolite, Ronald D Other Clinician: Referring Lafe Clerk: Treating Janiesha Diehl/Extender:Robson, Almira CoasterMichael [name], Chalmers Guestonald D Weeks in Treatment: 1 Visit Information History Since Last Visit All ordered tests and consults were completed: No Patient Arrived: Ambulatory Added or deleted any medications: No Arrival Time: 16:01 Any new allergies or adverse reactions: No Accompanied By: self Had a fall or experienced change in No Transfer Assistance: None activities of daily living that may affect Patient Identification Verified: Yes risk of falls: Secondary Verification Process Completed: Yes Signs or symptoms of abuse/neglect since last No Patient Requires Transmission-Based No visito Precautions: Hospitalized since last visit: No Patient Has Alerts: No Implantable device outside of the clinic excluding No cellular tissue based products placed in the center since last visit: Has Dressing in Place as Prescribed: Yes Pain Present Now: No Electronic Signature(s) Signed: 03/12/2019 3:01:15 PM By: Yevonne PaxEpps, Carrie RN Entered By: Yevonne PaxEpps, Carrie on 01/18/2019 16:01:39 -------------------------------------------------------------------------------- Encounter Discharge Information Details Patient Name: Date of Service: [name], Max 01/18/2019 3:30 PM Medical Record Number:[id number] Patient Account Number: [id number] Date of Birth/Sex: Treating RN: 29-Nov-1985 (33 y.o. [name]) Dwiggins, Shannon Primary Care Arella Blinder: Katy ApoPolite, Ronald D Other Clinician: Referring Licia Harl: Treating Amaris Delafuente/Extender:Robson, Almira CoasterMichael [name], Chalmers Guestonald D Weeks in Treatment: 1 Encounter Discharge Information Items Post  Procedure Vitals Discharge Condition: Stable Temperature (F): 98.2 Ambulatory Status: Ambulatory Pulse (bpm): 100 Discharge Destination: Home Respiratory Rate (breaths/min): 18 Transportation: Other Blood Pressure (mmHg): 153/87 Accompanied By: self Schedule Follow-up Appointment: Yes Clinical Summary of Care: Patient Declined Electronic Signature(s) Signed: 01/22/2019 6:16:57 PM By: Cherylin Mylarwiggins, Shannon Entered By: Cherylin Mylarwiggins, Shannon on 01/18/2019 17:20:53 -------------------------------------------------------------------------------- Lower Extremity Assessment Details Patient Name: Date of Service: [name], Max 01/18/2019 3:30 PM Medical Record Number:[id number] Patient Account Number: [id number] Date of Birth/Sex: Treating RN: 29-Nov-1985 (33 y.o. [name]) Epps, Carrie Primary Care Khani Paino: Katy ApoPolite, Ronald D Other Clinician: Referring Izacc Demeyer: Treating Beuford Garcilazo/Extender:Robson, Almira CoasterMichael [name], Deirdre Peeronald D Weeks in Treatment: 1 Edema Assessment Assessed: [Left: No] [Right: No] Edema: [Left: Ye] [Right: s] Calf Left: Right: Point of Measurement: 48 cm From Medial Instep 45 cm cm Ankle Left: Right: Point of Measurement: 12 cm From Medial Instep 31 cm cm Electronic Signature(s) Signed: 03/12/2019 3:01:15 PM By: Yevonne PaxEpps, Carrie RN Entered By: Yevonne PaxEpps, Carrie on 01/18/2019 16:09:22 -------------------------------------------------------------------------------- Multi Wound Chart Details Patient Name: Date of Service: [name], Max 01/18/2019 3:30 PM Medical Record Number:[id number] Patient Account Number: [id number] Date of Birth/Sex: Treating RN: 29-Nov-1985 (33 y.o. Elizebeth KollerM) Lynch, Shatara Primary Care Lynnea Vandervoort: Katy ApoPolite, Ronald D Other Clinician: Referring Karys Meckley: Treating Sullivan Blasing/Extender:Robson, Almira CoasterMichael [name], Chalmers Guestonald D Weeks in Treatment: 1 Vital Signs Height(in): 77 Capillary Blood 170 Glucose(mg/dl): Weight(lbs): 409260 Pulse(bpm): 100 Body Mass Index(BMI): 31 Blood Pressure(mmHg):  153/87 Temperature(F): 98.2 Respiratory 18 Rate(breaths/min): Photos: [1:No Photos] [N/A:N/A] Wound Location: [1:Left Metatarsal head fifth N/A] Wounding Event: [1:Trauma] [N/A:N/A] Primary Etiology: [1:Diabetic Wound/Ulcer of the N/A Lower Extremity] Comorbid History: [1:Type II Diabetes] [N/A:N/A] Date Acquired: [1:08/03/2018] [N/A:N/A] Weeks of Treatment: [1:1] [N/A:N/A] Wound Status: [1:Open] [N/A:N/A] Measurements L x W x D 2.3x2x0.4 [N/A:N/A] (cm) Area (cm) : [1:3.613] [N/A:N/A] Volume (cm) : [1:1.445] [N/A:N/A] % Reduction in Area: [1:-23.00%] [N/A:N/A] % Reduction in Volume: 38.50% [N/A:N/A] Starting Position 1 [1:12] (o'clock): Ending Position 1 [1:12] (o'clock): Maximum Distance 1 [1:2.5] (cm): Undermining: [1:Yes] [N/A:N/A] Classification: [1:Grade 2] [N/A:N/A] Exudate Amount: [1:Medium] [N/A:N/A] Exudate Type: [1:Serosanguineous] [  N/A:N/A] Exudate Color: [1:red, brown] [N/A:N/A] Wound Margin: [1:Well defined, not attached N/A] Granulation Amount: [1:Large (67-100%)] [N/A:N/A] Granulation Quality: [1:Red, Pink] [N/A:N/A] Necrotic Amount: [1:Small (1-33%)] [N/A:N/A] Exposed Structures: [1:Fat Layer (Subcutaneous N/A Tissue) Exposed: Yes Fascia: No Tendon: No Muscle: No Joint: No Bone: No] Epithelialization: [1:None] [N/A:N/A] Debridement: [1:Debridement - Excisional N/A] Pre-procedure [1:16:42] [N/A:N/A] Verification/Time Out Taken: Tissue Debrided: [1:Callus, Subcutaneous] [N/A:N/A] Level: [1:Skin/Subcutaneous Tissue N/A] Debridement Area (sq cm):4.6 [N/A:N/A] Instrument: [1:Blade, Forceps] [N/A:N/A] Bleeding: [1:Moderate] [N/A:N/A] Hemostasis Achieved: [1:Silver Nitrate] [N/A:N/A] Procedural Pain: [1:0] [N/A:N/A] Post Procedural Pain: [1:0] [N/A:N/A] Debridement Treatment Procedure was tolerated [N/A:N/A] Response: [1:well] Post Debridement [1:2.3x2x0.4] [N/A:N/A] Measurements L x W x D (cm) Post Debridement [1:1.445] [N/A:N/A] Volume:  (cm) Procedures Performed: [1:Debridement] [N/A:N/A] Treatment Notes Wound #1 (Left Metatarsal head fifth) 1. Cleanse With Wound Cleanser 2. Periwound Care Skin Prep 3. Primary Dressing Applied Calcium Alginate Ag 4. Secondary Dressing Dry Gauze Roll Gauze 5. Secured With Secretary/administrator) Signed: 01/18/2019 5:33:39 PM By: Baltazar Najjar MD Signed: 01/18/2019 6:00:55 PM By: Zandra Abts RN, BSN Entered By: Baltazar Najjar on 01/18/2019 17:24:53 -------------------------------------------------------------------------------- Multi-Disciplinary Care Plan Details Patient Name: Date of Service: [name], Max 01/18/2019 3:30 PM Medical Record Number:[id number] Patient Account Number: [id number] Date of Birth/Sex: Treating RN: Nov 05, 1985 (33 y.o. [name] Primary Care Zebulen Simonis: Katy Apo Other Clinician: Referring Chavy Avera: Treating Aimie Wagman/Extender:Robson, Almira Coaster, Chalmers Guest in Treatment: 1 Active Inactive Nutrition Nursing Diagnoses: Impaired glucose control: actual or potential Potential for alteratiion in Nutrition/Potential for imbalanced nutrition Goals: Patient/caregiver agrees to and verbalizes understanding of need to use nutritional supplements and/or vitamins as prescribed Date Initiated: 01/11/2019 Target Resolution Date: 02/08/2019 Goal Status: Active Patient/caregiver will maintain therapeutic glucose control Date Initiated: 01/11/2019 Target Resolution Date: 02/08/2019 Goal Status: Active Interventions: Assess HgA1c results as ordered upon admission and as needed Assess patient nutrition upon admission and as needed per policy Provide education on elevated blood sugars and impact on wound healing Provide education on nutrition Notes: Wound/Skin Impairment Nursing Diagnoses: Impaired tissue integrity Knowledge deficit related to ulceration/compromised skin integrity Goals: Patient/caregiver will verbalize  understanding of skin care regimen Date Initiated: 01/11/2019 Target Resolution Date: 02/08/2019 Goal Status: Active Ulcer/skin breakdown will have a volume reduction of 30% by week 4 Date Initiated: 01/11/2019 Target Resolution Date: 02/08/2019 Goal Status: Active Interventions: Assess patient/caregiver ability to obtain necessary supplies Assess patient/caregiver ability to perform ulcer/skin care regimen upon admission and as needed Assess ulceration(s) every visit Provide education on ulcer and skin care Notes: Electronic Signature(s) Signed: 01/18/2019 6:00:55 PM By: Zandra Abts RN, BSN Entered By: Zandra Abts on 01/18/2019 17:47:27 -------------------------------------------------------------------------------- Pain Assessment Details Patient Name: Date of Service: Mccambridge, Max 01/18/2019 3:30 PM Medical Record Number:[id number] Patient Account Number: [id number] Date of Birth/Sex: Treating RN: 1985/09/01 (33 y.o. [name] Primary Care Olia Hinderliter: Katy Apo Other Clinician: Referring Girolamo Lortie: Treating Nekeisha Aure/Extender:Robson, Almira Coaster, Deirdre Peer Weeks in Treatment: 1 Active Problems Location of Pain Severity and Description of Pain Patient Has Paino No Site Locations Pain Management and Medication Current Pain Management: Electronic Signature(s) Signed: 03/12/2019 3:01:15 PM By: Yevonne Pax RN Entered By: Yevonne Pax on 01/18/2019 16:03:37 -------------------------------------------------------------------------------- Patient/Caregiver Education Details Patient Name: Date of Service: [name], Max 10/16/2020andnbsp3:30 PM Medical Record Number:[id number] Patient Account Number: [id number] Date of Birth/Gender: Treating RN: 1986/02/08 (33 y.o. [name] Primary Care Physician: Katy Apo Other Clinician: Referring Physician: Treating Physician/Extender:Robson, Almira Coaster, Chalmers Guest in Treatment: 1 Education  Assessment Education Provided To: Patient Education Topics Provided  Wound/Skin Impairment: Methods: Explain/Verbal Responses: State content correctly Electronic Signature(s) Signed: 01/18/2019 6:00:55 PM By: [name] RN, BSN Entered By: [name] on 01/18/2019 17:47:35 -------------------------------------------------------------------------------- Wound Assessment Details Patient Name: Date of Service: [name], Max 01/18/2019 3:30 PM Medical Record [id number] Patient Account Number: [id number] Date of Birth/Sex: Treating RN: Jun 17, 1985 (33 y.o. Jerilynn Mages) Carlene Coria Primary Care Gracelee Stemmler: [name] Other Clinician: Referring Geraldy Akridge: Treating Loney Domingo/Extender:Robson, [name], Ashby Dawes Weeks in Treatment: 1 Wound Status Wound Number: 1 Primary Diabetic Wound/Ulcer of the Lower Etiology: Extremity Wound Location: Left Metatarsal head fifth Wound Status: Open Wounding Event: Trauma Comorbid Type II Diabetes Date Acquired: 08/03/2018 History: Weeks Of Treatment: 1 Clustered Wound: No Wound Measurements Length: (cm) 2.3 Width: (cm) 2 Depth: (cm) 0.4 Area: (cm) 3.613 Volume: (cm) 1.445 % Reduction in Area: -23% % Reduction in Volume: 38.5% Epithelialization: None Tunneling: No Undermining: Yes Starting Position (o'clock): 12 Ending Position (o'clock): 12 Maximum Distance: (cm) 2.5 Wound Description Classification: Grade 2 Foul Odo Wound Margin: Well defined, not attached Slough/F Exudate Amount: Medium Exudate Type: Serosanguineous Exudate Color: red, brown Wound Bed Granulation Amount: Large (67-100%) Granulation Quality: Red, Pink Fascia Ex Necrotic Amount: Small (1-33%) Fat Layer Necrotic Quality: Adherent Slough Tendon Ex Muscle Ex Joint Exp Bone Expo r After Cleansing: No ibrino Yes Exposed Structure posed: No (Subcutaneous Tissue) Exposed: Yes posed: No posed: No osed: No sed: No Electronic Signature(s) Signed:  03/12/2019 3:01:15 PM By: Carlene Coria RN Entered By: Carlene Coria on 01/18/2019 16:09:32 -------------------------------------------------------------------------------- Vitals Details Patient Name: Date of Service: [name], Max 01/18/2019 3:30 PM Medical Record [id number] Patient Account Number: [id number] Date of Birth/Sex: Treating RN: 08-11-1985 (33 y.o. Jerilynn Mages) Carlene Coria Primary Care Khameron Gruenwald: [name] Other Clinician: Referring Holmes Hays: Treating Markela Wee/Extender:Robson, [name], Ashby Dawes Weeks in Treatment: 1 Vital Signs Time Taken: 16:01 Temperature (F): 98.2 Height (in): 77 Pulse (bpm): 100 Weight (lbs): 260 Respiratory Rate (breaths/min): 18 Body Mass Index (BMI): 30.8 Blood Pressure (mmHg): 153/87 Capillary Blood Glucose (mg/dl): 170 Reference Range: 80 - 120 mg / dl Notes CBG per patient Electronic Signature(s) Signed: 03/12/2019 3:01:15 PM By: Carlene Coria RN Entered By: Carlene Coria on 01/18/2019 16:03:31

## 2019-03-12 NOTE — Progress Notes (Addendum)
[name], Max ([id number]) Visit Report for 03/12/2019 HBO Details Patient Name: Date of Service: [name], Max 03/12/2019 10:00 AM Medical Record [id number] Patient Account Number: [id number] Date of Birth/Sex: Treating RN: [birth year] (33 y.o. M) Primary Care [name]: [name] Other Clinician: [name] Referring [name]: Treating [name], [name], [name] Total Treatments Ordered: 40 HBO Treatment Start Date: 03/12/2019 HBO Indication: Diabetic Ulcer(s) of the Lower Extremity HBO Treatment Details Treatment Number: 1 Patient Type: Outpatient Chamber Type: Monoplace Chamber Serial #: [id number] Treatment Protocol: 2.0 ATA with 90 minutes oxygen, and no air breaks Treatment Details Compression Rate Down: 1.5 psi / minute De-Compression Rate Up: 2.0 psi / minute Air breaks and CompressTx Pressure breathing DecompressDecompress Begins Reached periods Begins Ends (leave unused spaces blank) Chamber Pressure (ATA)1 2 ------2 1 Clock Time (24 hr) 10:37 10:53 - - - - - - 12:23 12:31 Treatment Length: 114 (minutes) Treatment Segments: 4 Vital Signs Capillary Blood Glucose Reference Range: 80 - 120 mg / dl HBO Diabetic Blood Glucose Intervention Range: <131 mg/dl or >249 mg/dl Time Vitals Blood Respiratory Capillary Blood Glucose Pulse Action Type: Pulse: Temperature: Taken: Pressure: Rate: Glucose (mg/dl): Meter #: Oximetry (%) Taken: Pre 10:15 144/87 104 16 98 195 Post 12:35 140/90 82 16 98.2 206 Treatment Response Treatment Toleration: Well Treatment Completion Treatment Completed without Adverse Event Status: Additional Procedure Documentation Tissue Sevierity: Necrosis of bone Sorren Vallier Notes Patient's first treatment. He tolerated this well. Tympanic membranes were normal. Cardiac and respiratory  exams normal Physician HBO Attestation: I certify that I supervised this HBO treatment in accordance with Medicare guidelines. A trained Yes emergency response team is readily available per hospital policies and procedures. Continue HBOT as ordered. Yes Electronic Signature(s) Signed: 03/12/2019 5:53:37 PM By: [name] MD Previous Signature: 03/12/2019 4:34:42 PM Version By: [name] EMT/HBOT Entered By: [name] on 03/12/2019 17:49:52 -------------------------------------------------------------------------------- HBO Safety Checklist Details Patient Name: Date of Service: [name], Max 03/12/2019 10:00 AM Medical Record [id number] Patient Account Number: [id number] Date of Birth/Sex: Treating RN: [birth year] (33 y.o. M) Primary Care [name]: [name] Other Clinician: [name] Referring Jahnessa Vanduyn: Treating Orelia Brandstetter/Extender:Robson, [name], [name]: 8 HBO Safety Checklist Items Safety Checklist Consent Form Signed Patient voided / foley secured and emptied When did you last eato 0900 - lemonade Last dose of injectable or oral agent insulin NA Ostomy pouch emptied and vented if applicable All implantable devices assessed, documented and approved NA Intravenous access site secured and place Valuables secured Linens and cotton and cotton/polyester blend (less than 51% polyester) Personal oil-based products / skin lotions / body lotions removed NA Wigs or hairpieces removed NA Smoking or tobacco materials removed Books / newspapers / magazines / loose paper removed Cologne, aftershave, perfume and deodorant removed Jewelry removed (may wrap wedding band) NA Make-up removed Hair care products removed Battery operated devices (external) removed NA Heating patches and chemical warmers removed NA Titanium eyewear removed NA Nail polish cured greater than 10 hours NA Casting material cured greater than 10  hours NA Hearing aids removed NA Loose dentures or partials removed NA Prosthetics have been removed Patient demonstrates correct use of air break device (if applicable) Patient concerns have been addressed Patient grounding bracelet on and cord attached to chamber Specifics for Inpatients (complete in addition to above) Medication sheet sent with patient Intravenous medications needed or due during therapy sent with patient Drainage tubes (  e.g. nasogastric tube or chest tube secured and vented) Endotracheal or Tracheotomy tube secured Cuff deflated of air and inflated with saline Airway suctioned Electronic Signature(s) Signed: 03/20/2019 3:47:19 PM By: [name] EMT/HBOT Previous Signature: 03/19/2019 3:57:04 PM Version By: [name] EMT/HBOT Previous Signature: 03/12/2019 11:09:18 AM Version By: [name] EMT/HBOT Entered By: [name] on 03/20/2019 11:18:08

## 2019-03-12 NOTE — Progress Notes (Signed)
Schuld, [name] ([id number]) Visit Report for 01/11/2019 Abuse/Suicide Risk Screen Details Patient Name: Date of Service: [name], [name] 01/11/2019 2:45 PM Medical Record Number:[id number] Patient Account Number: [id number] Date of Birth/Sex: Treating RN: [birth year] (33 y.o. [name]) Yevonne Pax Primary Care Athena Baltz: Katy Apo Other Clinician: Referring Kori Goins: Treating Hazeline Charnley/Extender:Robson, Almira Coaster, Deirdre Peer Weeks in Treatment: 0 Abuse/Suicide Risk Screen Items Answer ABUSE RISK SCREEN: Has anyone close to you tried to hurt or harm you recentlyo No Do you feel uncomfortable with anyone in your familyo No Has anyone forced you do things that you didnt want to doo No Electronic Signature(s) Signed: 03/12/2019 2:59:38 PM By: Yevonne Pax RN Entered By: Yevonne Pax on 01/11/2019 15:56:54 -------------------------------------------------------------------------------- Activities of Daily Living Details Patient Name: Date of Service: [name], [name] 01/11/2019 2:45 PM Medical Record Number:[id number] Patient Account Number: [id number] Date of Birth/Sex: Treating RN: 1985/06/24 (33 y.o. [name]) Yevonne Pax Primary Care Torina Ey: Katy Apo Other Clinician: Referring Sitlali Koerner: Treating Sheronica Corey/Extender:Robson, Almira Coaster, Deirdre Peer Weeks in Treatment: 0 Activities of Daily Living Items Answer Activities of Daily Living (Please select one for each item) Drive Automobile Completely Able Take Medications Completely Able Use Telephone Completely Able Care for Appearance Completely Able Use Toilet Completely Able Bath / Shower Completely Able Dress Self Completely Able Feed Self Completely Able Walk Completely Able Get In / Out Bed Completely Able Housework Completely Able Prepare Meals Completely Able Handle Money Completely Able Shop for Self Completely Able Electronic Signature(s) Signed: 03/12/2019 2:59:38 PM By: Yevonne Pax RN Entered By: Yevonne Pax on  01/11/2019 15:57:18 -------------------------------------------------------------------------------- Education Screening Details Patient Name: Date of Service: [name], [name] 01/11/2019 2:45 PM Medical Record Number:[id number] Patient Account Number: [id number] Date of Birth/Sex: Treating RN: 12/25/1985 (33 y.o. [name] Primary Care Loris Winrow: Katy Apo Other Clinician: Referring Juniel Groene: Treating Bren Borys/Extender:Robson, Almira Coaster, Chalmers Guest in Treatment: 0 Primary Learner Assessed: Patient Learning Preferences/Education Level/Primary Language Learning Preference: Explanation Highest Education Level: College or Above Preferred Language: English Cognitive Barrier Language Barrier: No Translator Needed: No Memory Deficit: No Emotional Barrier: No Cultural/Religious Beliefs Affecting Medical Care: No Physical Barrier Impaired Vision: No Impaired Hearing: No Decreased Hand dexterity: No Knowledge/Comprehension Knowledge Level: High Comprehension Level: High Ability to understand written High instructions: Ability to understand verbal High instructions: Motivation Anxiety Level: Calm Cooperation: Cooperative Education Importance: Acknowledges Need Interest in Health Problems: Asks Questions Perception: Coherent Willingness to Engage in Self- High Management Activities: Readiness to Engage in Self- High Management Activities: Electronic Signature(s) Signed: 03/12/2019 2:59:38 PM By: Yevonne Pax RN Entered By: Yevonne Pax on 01/11/2019 16:00:23 -------------------------------------------------------------------------------- Fall Risk Assessment Details Patient Name: Date of Service: [name], [name] 01/11/2019 2:45 PM Medical Record Number:[id number] Patient Account Number: [id number] Date of Birth/Sex: Treating RN: 02-09-1986 (33 y.o. [name]) Yevonne Pax Primary Care Zong Mcquarrie: Katy Apo Other Clinician: Referring Emilynn Srinivasan: Treating  Evangelyn Crouse/Extender:Robson, Almira Coaster, Deirdre Peer Weeks in Treatment: 0 Fall Risk Assessment Items Have you had 2 or more falls in the last 12 monthso 0 No Have you had any fall that resulted in injury in the last 12 monthso 0 No FALLS RISK SCREEN History of falling - immediate or within 3 months 0 No Secondary diagnosis (Do you have 2 or more medical diagnoseso) 0 No Ambulatory aid None/bed rest/wheelchair/nurse 0 No Crutches/cane/walker 0 No Furniture 0 No Intravenous therapy Access/Saline/Heparin Lock 0 No Weak (short steps with or without shuffle, stooped but able to lift head 0 No while walking, may seek support from furniture) Impaired (short steps with  shuffle, may have difficulty arising from chair, 0 No head down, impaired balance) Mental Status Oriented to own ability 0 No Overestimates or forgets limitations 0 No Risk Level: Low Risk Score: 0 Electronic Signature(s) Signed: 03/12/2019 2:59:38 PM By: Carlene Coria RN Entered By: Carlene Coria on 01/11/2019 16:00:40 -------------------------------------------------------------------------------- Foot Assessment Details Patient Name: Date of Service: [name] 01/11/2019 2:45 PM Medical Record [id number] Patient Account Number: [id number] Date of Birth/Sex: Treating RN: 09/20/1985 (33 y.o. Jerilynn Mages) Carlene Coria Primary Care Jalexus Brett: [name] Other Clinician: Referring Janete Quilling: Treating Gerrod Maule/Extender:Robson, [name], Ashby Dawes Weeks in Treatment: 0 Foot Assessment Items Site Locations + = Sensation present, - = Sensation absent, C = Callus, U = Ulcer R = Redness, W = Warmth, M = Maceration, PU = Pre-ulcerative lesion F = Fissure, S = Swelling, D = Dryness Assessment Right: Left: Other Deformity: No No Prior Foot Ulcer: No No Prior Amputation: No No Charcot Joint: No No Ambulatory Status: Ambulatory Without Help Gait: Steady Electronic Signature(s) Signed: 03/12/2019 2:59:38 PM By: Carlene Coria RN Entered By: Carlene Coria on 01/11/2019 16:02:34 -------------------------------------------------------------------------------- Nutrition Risk Screening Details Patient Name: Date of Service: Hodges, [name] 01/11/2019 2:45 PM Medical Record [id number] Patient Account Number: [id number] Date of Birth/Sex: Treating RN: 08-13-1985 (33 y.o. Jerilynn Mages) Carlene Coria Primary Care Kimesha Claxton: [name] Other Clinician: Referring Amauris Debois: Treating Taja Pentland/Extender:Robson, [name], Ashby Dawes Weeks in Treatment: 0 Height (in): 77 Weight (lbs): 260 Body Mass Index (BMI): 30.8 Nutrition Risk Screening Items Score Screening NUTRITION RISK SCREEN: I have an illness or condition that made me change the kind and/or 0 No amount of food I eat I eat fewer than two meals per day 0 No I eat few fruits and vegetables, or milk products 0 No I have three or more drinks of beer, liquor or wine almost every day 0 No I have tooth or mouth problems that make it hard for me to eat 0 No I don't always have enough money to buy the food I need 0 No I eat alone most of the time 0 No I take three or more different prescribed or over-the-counter drugs a day 0 No 0 No Without wanting to, I have lost or gained 10 pounds in the last six months I am not always physically able to shop, cook and/or feed myself 0 No Nutrition Protocols Good Risk Protocol 0 No interventions needed Moderate Risk Protocol High Risk Proctocol Risk Level: Good Risk Score: 0 Electronic Signature(s) Signed: 03/12/2019 2:59:38 PM By: Carlene Coria RN Entered By: Carlene Coria on 01/11/2019 16:00:53

## 2019-03-12 NOTE — Progress Notes (Signed)
Legere, Max ([id number]) Visit Report for 01/25/2019 Arrival Information Details Patient Name: Date of Service: [name], Max 01/25/2019 2:45 PM Medical Record Number:[id number] Patient Account Number: [id number] Date of Birth/Sex: Treating RN: 23-Oct-1985 (33 y.o. Max KollerM) [name], Max Primary Care [name]: Katy ApoPolite, Max D Other Clinician: Referring [name]: Treating [name]/Extender:Robson, [name], [name] in Treatment: 2 Visit Information History Since Last Visit Added or deleted any medications: No Patient Arrived: Ambulatory Any new allergies or adverse reactions: No Arrival Time: 15:31 Had a fall or experienced change in No Accompanied By: self activities of daily living that may affect Transfer Assistance: None risk of falls: Patient Identification Verified: Yes Signs or symptoms of abuse/neglect since last No Secondary Verification Process Completed: Yes visito Patient Requires Transmission-Based No Hospitalized since last visit: No Precautions: Implantable device outside of the clinic excluding No Patient Has Alerts: No cellular tissue based products placed in the center since last visit: Has Dressing in Place as Prescribed: Yes Pain Present Now: No Electronic Signature(s) Signed: 03/12/2019 3:02:15 PM By: [name] Entered By: [name] on 01/25/2019 15:32:21 -------------------------------------------------------------------------------- Encounter Discharge Information Details Patient Name: Date of Service: [name], Max 01/25/2019 2:45 PM Medical Record Number:[id number] Patient Account Number: [id number] Date of Birth/Sex: Treating RN: 23-Oct-1985 (33 y.o. Max RightM) [name], Max Primary Care Niemah Schwebke: Katy ApoPolite, Max D Other Clinician: Referring Mikaella Escalona: Treating Philomena Buttermore/Extender:Robson, [name], [name] in Treatment: 2 Encounter Discharge Information Items Post Procedure Vitals Discharge Condition:  Stable Temperature (F): 98.8 Ambulatory Status: Ambulatory Pulse (bpm): 89 Discharge Destination: Home Respiratory Rate (breaths/min): 18 Transportation: Private Auto Blood Pressure (mmHg): 135/78 Accompanied By: self Schedule Follow-up Appointment: Yes Clinical Summary of Care: Patient Declined Electronic Signature(s) Signed: 01/28/2019 5:21:50 PM By: [name], Max Entered By: [name], Max on 01/25/2019 18:28:13 -------------------------------------------------------------------------------- Lower Extremity Assessment Details Patient Name: Date of Service: [name], Max 01/25/2019 2:45 PM Medical Record Number:[id number] Patient Account Number: [id number] Date of Birth/Sex: Treating RN: 23-Oct-1985 (33 y.o. Max KollerM) [name], Max Primary Care Cinzia Devos: Katy ApoPolite, Max D Other Clinician: Referring Chanay Nugent: Treating Abrian Hanover/Extender:[name] in Treatment: 2 Edema Assessment Assessed: [Left: No] [[name]: No] Edema: [Left: Ye] [[name]: s] Calf Left: [name]: Point of Measurement: 48 cm From Medial Instep 45 cm cm Ankle Left: [name]: Point of Measurement: 12 cm From Medial Instep 31 cm cm Vascular Assessment Pulses: Dorsalis Pedis Palpable: [Left:Yes] Electronic Signature(s) Signed: 01/25/2019 5:59:58 PM By: [name], [name] Entered By: [name], Max on 01/25/2019 16:07:27 -------------------------------------------------------------------------------- Multi Wound Chart Details Patient Name: Date of Service: [name] 01/25/2019 2:45 PM Medical Record [id number] Patient Account Number: [id number] Date of Birth/Sex: Treating RN: 23-Oct-1985 (33 y.o. Max KollerM) [name], Max Primary Care Yocelin Vanlue: Katy ApoPolite, Max D Other Clinician: Referring Karsen Fellows: Treating Olubunmi Rothenberger/Extender:Robson, [name], [name] in Treatment: 2 Vital Signs Height(in): 77 Capillary Blood 204 Glucose(mg/dl): Weight(lbs): 213260 Pulse(bpm):  89 Body Mass Index(BMI): 31 Blood Pressure(mmHg): 135/78 Temperature(F): 98.8 Respiratory 18 Rate(breaths/min): Photos: [1:No Photos] [N/A:N/A] Wound Location: [1:Left Metatarsal head fifth N/A] Wounding Event: [1:Trauma] [N/A:N/A] Primary Etiology: [1:Diabetic Wound/Ulcer of the N/A Lower Extremity] Comorbid History: [1:Type II Diabetes] [N/A:N/A] Date Acquired: [1:08/03/2018] [N/A:N/A] [name] of Treatment: [1:2] [N/A:N/A] Wound Status: [1:Open] [N/A:N/A] Measurements L x W x D 2x2.3x0.4 [N/A:N/A] (cm) Area (cm) : [1:3.613] [N/A:N/A] Volume (cm) : [1:1.445] [N/A:N/A] % Reduction in Area: [1:-23.00%] [N/A:N/A] % Reduction in Volume: 38.50% [N/A:N/A] Starting Position 1 [1:12] (o'clock): Ending Position 1 [1:12] (o'clock): Maximum Distance 1 [1:2.3] (cm): Undermining: [1:Yes] [N/A:N/A] Classification: [1:Grade 2] [N/A:N/A] Exudate Amount: [1:Medium] [N/A:N/A] Exudate Type: [1:Serosanguineous] [  N/A:N/A] Exudate Color: [1:red, brown] [N/A:N/A] Wound Margin: [1:Well defined, not attached N/A] Granulation Amount: [1:Large (67-100%)] [N/A:N/A] Granulation Quality: [1:Red, Pink] [N/A:N/A] Necrotic Amount: [1:Small (1-33%)] [N/A:N/A] Exposed Structures: [1:Fat Layer (Subcutaneous N/A Tissue) Exposed: Yes Fascia: No Tendon: No Muscle: No Joint: No Bone: No] Epithelialization: [1:None] [N/A:N/A] Debridement: [1:Debridement - Excisional N/A] Pre-procedure [1:16:06] [N/A:N/A] Verification/Time Out Taken: Tissue Debrided: [1:Subcutaneous, Slough] [N/A:N/A] Level: [1:Skin/Subcutaneous Tissue N/A] Debridement Area (sq cm):4.6 [N/A:N/A] Instrument: [1:Curette] [N/A:N/A] Bleeding: [1:Minimum] [N/A:N/A] Hemostasis Achieved: [1:Pressure] [N/A:N/A] Procedural Pain: [1:0] [N/A:N/A] Post Procedural Pain: [1:0] [N/A:N/A] Debridement Treatment [1:Procedure was tolerated] [N/A:N/A] Response: [1:well] Post Debridement [1:2x2.3x0.4] [N/A:N/A] Measurements L x W x D (cm) Post Debridement  [1:1.445] [N/A:N/A] Volume: (cm) Procedures Performed: [1:Debridement] [N/A:N/A] Treatment Notes Electronic Signature(s) Signed: 01/25/2019 5:59:58 PM By: Max Abts RN, [name] Signed: 01/25/2019 6:06:00 PM By: Baltazar Najjar MD Entered By: Baltazar Najjar on 01/25/2019 16:42:31 -------------------------------------------------------------------------------- Multi-Disciplinary Care Plan Details Patient Name: Date of Service: Vessel, Max 01/25/2019 2:45 PM Medical Record Number:[id number] Patient Account Number: [id number] Date of Birth/Sex: Treating RN: 10-10-1985 (33 y.o. [name] Primary Care Knute Mazzuca: Katy Apo Other Clinician: Referring Keiton Cosma: Treating Sam Overbeck/Extender:Robson, Max Coaster, Max Guest in Treatment: 2 Active Inactive Nutrition Nursing Diagnoses: Impaired glucose control: actual or potential Potential for alteratiion in Nutrition/Potential for imbalanced nutrition Goals: Patient/caregiver agrees to and verbalizes understanding of need to use nutritional supplements and/or vitamins as prescribed Date Initiated: 01/11/2019 Target Resolution Date: 02/08/2019 Goal Status: Active Patient/caregiver will maintain therapeutic glucose control Date Initiated: 01/11/2019 Target Resolution Date: 02/08/2019 Goal Status: Active Interventions: Assess HgA1c results as ordered upon admission and as needed Assess patient nutrition upon admission and as needed per policy Provide education on elevated blood sugars and impact on wound healing Provide education on nutrition Notes: Wound/Skin Impairment Nursing Diagnoses: Impaired tissue integrity Knowledge deficit related to ulceration/compromised skin integrity Goals: Patient/caregiver will verbalize understanding of skin care regimen Date Initiated: 01/11/2019 Target Resolution Date: 02/08/2019 Goal Status: Active Ulcer/skin breakdown will have a volume reduction of 30% by week 4 Date  Initiated: 01/11/2019 Target Resolution Date: 02/08/2019 Goal Status: Active Interventions: Assess patient/caregiver ability to obtain necessary supplies Assess patient/caregiver ability to perform ulcer/skin care regimen upon admission and as needed Assess ulceration(s) every visit Provide education on ulcer and skin care Notes: Electronic Signature(s) Signed: 01/25/2019 5:59:58 PM By: Max Abts RN, [name] Entered By: Max Abts on 01/25/2019 17:52:30 -------------------------------------------------------------------------------- Pain Assessment Details Patient Name: Date of Service: Niebla, Max 01/25/2019 2:45 PM Medical Record [id number] Patient Account Number: [id number] Date of Birth/Sex: Treating RN: 05-12-1985 (33 y.o. [name] Primary Care Gagandeep Kossman: Katy Apo Other Clinician: Referring Marillyn Goren: Treating Gianny Sabino/Extender:Robson, Max Coaster, Max Guest in Treatment: 2 Active Problems Location of Pain Severity and Description of Pain Patient Has Paino No Site Locations Pain Management and Medication Current Pain Management: Electronic Signature(s) Signed: 01/25/2019 5:59:58 PM By: Max Abts RN, [name] Signed: 03/12/2019 3:02:15 PM By: Max Ito Entered By: Max Ito on 01/25/2019 15:33:11 -------------------------------------------------------------------------------- Patient/Caregiver Education Details Patient Name: Date of Service: [name], Max 10/23/2020andnbsp2:45 PM Medical Record Number:[id number] Patient Account Number: [id number] Date of Birth/Gender: Treating RN: December 28, 1985 (33 y.o. [name] Primary Care Physician: Katy Apo Other Clinician: Referring Physician: Treating Physician/Extender:Robson, Max Coaster, Max Guest in Treatment: 2 Education Assessment Education Provided To: Patient Education Topics Provided Wound/Skin Impairment: Methods: Explain/Verbal Responses:  State content correctly Electronic Signature(s) Signed: 01/25/2019 5:59:58 PM By: Max Abts RN, [name] Entered By: Max Abts on 01/25/2019 17:52:40 -------------------------------------------------------------------------------- Wound  Assessment Details Patient Name: Date of Service: [name], Max 01/25/2019 2:45 PM Medical Record [id number] Patient Account Number: [id number] Date of Birth/Sex: Treating RN: 1986/02/21 (33 y.o. [name] Primary Care Towanda Hornstein: [name] Other Clinician: Referring Jasman Murri: Treating Salihah Peckham/Extender:Robson, [name], [name]: 2 Wound Status Wound Number: 1 Primary Diabetic Wound/Ulcer of the Lower Etiology: Extremity Wound Location: Left Metatarsal head fifth Wound Status: Open Wounding Event: Trauma Comorbid Type II Diabetes Date Acquired: 08/03/2018 History: [name] Of Treatment: 2 Clustered Wound: No Photos Wound Measurements Length: (cm) 2 % Reduction in A Width: (cm) 2.3 % Reduction in V Depth: (cm) 0.4 Epithelializatio Area: (cm) 3.613 Undermining: Volume: (cm) 1.445 Starting Pos Ending Positi Maximum Dista rea: -23% olume: 38.5% n: None Yes ition (o'clock): 12 on (o'clock): 12 nce: (cm) 2.3 Wound Description Classification: Grade 2 Foul Odor After Wound Margin: Well defined, not attached Slough/Fibrino Exudate Amount: Medium Exudate Type: Serosanguineous Exudate Color: red, brown Wound Bed Granulation Amount: Large (67-100%) Granulation Quality: Red, Pink Fascia Exposed: Necrotic Amount: Small (1-33%) Fat Layer (Subc Necrotic Quality: Adherent Slough Tendon Exposed: Muscle Exposed: Joint Exposed: Bone Exposed: Electronic Signature(s) Signed: 01/28/2019 4:53:58 PM By: [name] EMT/HBOT Signed: 01/28/2019 6:04:10 PM By: [name] RN, [name] Previous Signature: 01/25/2019 5:59:58 PM Version By: Tedra Coupe Entered By: [name] on 10/26/ Cleansing:  No Yes Exposed Structure No utaneous Tissue) Exposed: Yes No No No No ara RN, [name] 2020 08:54:14 -------------------------------------------------------------------------------- Vitals Details Patient Name: Date of Service: [name], Max 01/25/2019 2:45 PM Medical Record [id number] Patient Account Number: [id number] Date of Birth/Sex: Treating RN: 1985-09-14 (33 y.o. [name] Primary Care Altan Kraai: [name] Other Clinician: Referring Anjulie Dipierro: Treating Shenita Trego/Extender:Robson, [name], [name]: 2 Vital Signs Time Taken: 15:32 Temperature (F): 98.8 Height (in): 77 Pulse (bpm): 89 Weight (lbs): 260 Respiratory Rate (breaths/min): 18 Body Mass Index (BMI): 30.8 Blood Pressure (mmHg): 135/78 Capillary Blood Glucose (mg/dl): 204 Reference Range: 80 - 120 mg / dl Electronic Signature(s) Signed: 03/12/2019 3:02:15 PM By: Sandre Kitty Entered By: Sandre Kitty on 01/25/2019 15:33:05

## 2019-03-13 NOTE — Progress Notes (Signed)
[name], Max ([id number]) Visit Report for 03/12/2019 Debridement Details Patient Name: Date of Service: [name], Max 03/12/2019 4:00 PM Medical Record [id number] Patient Account Number: [id number] Date of Birth/Sex: Treating RN: April 28, 1985 (33 y.o. [name]) [name] Primary Care Provider: Kandice [name] Other Clinician: Referring Provider: Treating Provider/Extender:[name] [name] in Treatment: 8 Debridement Performed for Wound #1 Left Metatarsal head fifth Assessment: Performed By: Physician [name]., MD Debridement Type: Debridement Severity of Tissue Pre Fat layer exposed Debridement: Level of Consciousness (Pre- Awake and Alert procedure): Pre-procedure Verification/Time Out Taken: Yes - 17:02 Start Time: 17:02 Pain Control: Other : benzocaine Total Area Debrided (L x W): 1.9 (cm) x 2.4 (cm) = 4.56 (cm) Tissue and other material Viable, Non-Viable, Slough, Skin: Dermis , Skin: Epidermis, Slough debrided: Level: Skin/Epidermis Debridement Description: Selective/Open Wound Instrument: Curette Bleeding: Moderate Hemostasis Achieved: Silver Nitrate End Time: 17:06 Procedural Pain: 0 Post Procedural Pain: 0 Response to Treatment: Procedure was tolerated well Level of Consciousness Awake and Alert (Post-procedure): Post Debridement Measurements of Total Wound Length: (cm) 1.9 Width: (cm) 2.4 Depth: (cm) 0.2 Volume: (cm) 0.716 Character of Wound/Ulcer Post Improved Debridement: Severity of Tissue Post Debridement: Fat layer exposed Post Procedure Diagnosis Same as Pre-procedure Electronic Signature(s) Signed: 03/12/2019 5:53:37 PM By: Max Ham MD Signed: 03/13/2019 12:07:41 PM By: Max Coria RN Entered By: [name] on 03/12/2019 17:18:27 -------------------------------------------------------------------------------- HPI Details Patient Name: Date of Service: [name], Max 03/12/2019 4:00 PM Medical  Record [id number] Patient Account Number: [id number] Date of Birth/Sex: Treating RN: 01-13-1986 (33 y.o. 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 He comes in for review of a wound over the plantar fifth metatarsal head extending into the lateral part of the foot. He was followed for this previously by his podiatrist Dr. Cornelius Moras. As the patient tells his story he went to see podiatry first for a swelling he developed on the lateral part of his fifth metatarsal head in May. He states this was "open" by podiatry and the area closed. He was followed up in June and it was again opened callus removed and it closed promptly. There were plans being made for surgery on the fifth metatarsal head in June however his blood sugar was apparently too high for anesthesia. Apparently the area was debrided and opened again in June and it is never closed since. Looking over the records from podiatry I am really not able to follow this. It was clear when he was first seen it was before 5/14 at that point he already had a wound. By 5/17 the ulcer was resolved. I do not see anything about a procedure. On 5/28 noted to have pre-ulcerative moderate keratosis. X-ray noted 1/5 contracted toe and tailor's bunion and metatarsal deformity. On a visit date on 09/28/2018 the dorsal part of the left foot it healed and resolved. There was concern about swelling in his lower extremity he was sent to the ER.. As far as I can tell he was seen in the ER on 7/12 with an ulcer on his left foot. A DVT rule out of the left leg was negative. I do not think I have complete records from podiatry but I am not able to verify the procedures this patient  states he had. He states after the  last procedure the wound has never closed although I am not able to follow this in the records I have from podiatry. He has not had a recent x-ray The patient has been using Neosporin on the wound. He is wearing a Darco shoe. He is still very active up on his foot working and exercising. Past medical history; type 2 diabetes ketosis-prone, leg swelling with a negative DVT study in July. Non-smoker ABI in our clinic was 0.85 on the left 10/16; substantial wound on the plantar left fifth met head extending laterally almost to the dorsal fifth MTP. We have been using silver alginate we gave him a Darco forefoot off loader. An x-ray did not show evidence of osteomyelitis did note soft tissue emphysema which I think was due to gas tracking through an open wound. There is no doubt in my mind he requires an MRI 10/23; MRI not booked until 3 November at the earliest this is largely due to his glucose sensor in the right arm. We have been using silver alginate. There has been an improvement 10/29; I am still not exactly sure when his MRI is booked for. He says it is the third but it is the 10th in epic. This definitely needs to be done. He is running a low-grade fever today but no other symptoms. No real improvement in the 1 02/26/2019 patient presents today for a follow-up visit here in our clinic he is last been seen in the clinic on October 29. Subsequently we were working on getting MRI to evaluate and see what exactly was going on and where we would need to go from the standpoint of whether or not he had osteomyelitis and again what treatments were going be required. Subsequently the patient ended up being admitted to the hospital on 02/07/2019 and was discharged on 02/14/2019. This is a somewhat interesting admission with a discharge diagnosis of pneumonia due to COVID-19 although he was positive for COVID-19 when tested at the urgent care but negative x2 when he  was actually in the hospital. With that being said he did have acute respiratory failure with hypoxia and it was noted he also have a left foot ulceration with osteomyelitis. With that being said he did require oxygen for his pneumonia and I level 4 L. He was placed on antivirals and steroids for the COVID-19. He was also transferred to the [facility] at one point. Nonetheless he did subsequently discharged home and since being home has done much better in that regard. The CT angiogram did not show any pulmonary embolism. With regard to the osteomyelitis the patient was placed on vancomycin and Zosyn while in the hospital but has been changed to Augmentin at discharge. It was also recommended that he follow-up with wound care and podiatry. Podiatry however wanted him to see [name] according to the patient prior to them doing anything further. His hemoglobin A1c was 9.9 as noted in the hospital. Have an MRI of the left foot performed while in the hospital on 02/04/2019. 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 There is an overlying plantar open wound noted an abscess tracking back along the lateral aspect of the fifth metatarsal shaft. There is otherwise diffuse cellulitis and mild fasciitis without findings of polymyositis. The patient did have recently pneumonia secondary to COVID-19 I looked in the chart through epic and it does appear that the patient may need to have an  additional x-ray just to ensure everything is cleared and that he has no airspace disease prior to putting him into the chamber. 03/05/2019; patient was readmitted to the clinic last week. He was hospitalized twice for a viral upper respiratory tract infection from 11/1 through 11/4 and then 11/5 through 11/12 ultimately this turned out to be Covid pneumonitis. Although he was discharged on oxygen he is not using it. He says he  feels fine. He has no exercise limitation no cough no sputum. His O2 sat in our clinic today was 100% on room air. 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 He received Vanco and Zosyn in the hospital and then was discharged on 2 [name] of Augmentin. I do not see any relevant cultures. He was supposed to follow-up with infectious disease but I do not see that he has an appointment. 12/8; patient saw Dr. Novella Olive of infectious disease last week. He felt that he had had adequate antibiotic therapy. He did not go to follow-up with Dr. Amalia Hailey of podiatry and I have again talked to him about the pros and cons of this. He does not want to consider a ray amputation of this time. He is aware of the risks of recurrence, migration etc. He started HBO today and tolerated this well. He can complete the Augmentin that I gave him last week. I have looked over the lab work that Dr. Chana Bode ordered his C-reactive protein was 3.3 and his sedimentation rate was 17. The C-reactive protein is never really been measurably that high in this patient Electronic Signature(s) Signed: 03/12/2019 5:53:37 PM By: Max Ham MD Entered By: [name] on 03/12/2019 17:19:46 -------------------------------------------------------------------------------- Physical Exam Details Patient Name: Date of Service: [name], Max 03/12/2019 4:00 PM Medical Record [id number] Patient Account Number: [id number] Date of Birth/Sex: Treating RN: [birth year] (33 y.o. Oval Linsey Primary Care Provider: Kandice [name] Other Clinician: Referring Provider: Treating Provider/Extender:Serafin [place], [name], [name] in Treatment: 8 Constitutional Sitting or standing Blood Pressure is within target range for patient.. Pulse regular and within target range for patient.Marland Kitchen Respirations regular, non-labored  and within target range.. Temperature is normal and within the target range for the patient.Marland Kitchen Appears in no distress. Notes Wound exam; the patient's wound is on the left fifth plantar metatarsal head there is undermining superiorly although that is somewhat better. No purulent drainage is seen. Thick skin and callus from around the wound edges removed with a #5 curette Electronic Signature(s) Signed: 03/12/2019 5:53:37 PM By: Max Ham MD Entered By: [name] on 03/12/2019 17:22:13 -------------------------------------------------------------------------------- Physician Orders Details Patient Name: Date of Service: [name], Max 03/12/2019 4:00 PM Medical Record [id number] Patient Account Number: [id number] Date of Birth/Sex: Treating RN: 1985/09/11 (33 y.o. 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 Dressing Change Frequency Wound #1 Left Metatarsal head fifth Change dressing every day. Skin Barriers/Peri-Wound Care Moisturizing lotion - to both legs daily. Wound Cleansing Wound #1 Left Metatarsal head fifth  May shower and wash wound with soap and water. Primary Wound Dressing Wound #1 Left Metatarsal head fifth Calcium Alginate with Silver Secondary Dressing Wound #1 Left Metatarsal head fifth Kerlix/Rolled Gauze Dry Gauze Other: - felt callous pad Off-Loading Wedge shoe to: - front offloading darco  to left foot Hyperbaric Oxygen Therapy Wound #1 Left Metatarsal head fifth Evaluate for HBO Therapy Indication: - osteomylitis left foot If appropriate for treatment, begin HBOT per protocol: 2.0 ATA for 90 Minutes without Air Breaks 2.5 ATA for 90 Minutes with 2 Five (5) Minute Air Breaks Total Number of Treatments: - 40 One treatments per day (delivered Monday through Friday unless otherwise specified in Special Instructions below): Finger stick Blood Glucose Pre- and Post- HBOT Treatment. Follow Hyperbaric Oxygen Glycemia Protocol GLYCEMIA INTERVENTIONS PROTOCOL PRE-HBO GLYCEMIA INTERVENTIONS ACTION INTERVENTION Obtain pre-HBO capillary blood 1 glucose (ensure physician order is in chart). A. Notify HBO physician and await physician orders. 2 If result is 70 mg/dl or below: B. If the result meets the hospital definition of a critical result, follow hospital policy. A. Give patient an 8 ounce Glucerna Shake, an 8 ounce Ensure, or 8 ounces of a Glucerna/Ensure equivalent dietary supplement*. B. Wait 30 minutes. C. Retest patients capillary blood If result is 71 mg/dl to 130 mg/dl: glucose (CBG). D. If result greater than or equal to 110 mg/dl, proceed with HBO. If result less than 110 mg/dl, notify HBO physician and consider holding HBO. If result is 131 mg/dl to 249 mg/dl: A. Proceed with HBO. A. Notify HBO physician and await physician orders. B. It is recommended to hold HBO If result is 250 mg/dl or greater: and do blood/urine ketone testing. C. If the result meets the hospital definition of a critical result, follow hospital policy. POST-HBO GLYCEMIA INTERVENTIONS ACTION INTERVENTION Obtain post HBO capillary blood 1 glucose (ensure physician order is in chart). A. Notify HBO physician and await physician orders. 2 If result is 70 mg/dl or below: B. If the result meets the hospital definition of a critical result, follow hospital policy. A. Give patient  an 8 ounce Glucerna Shake, an 8 ounce Ensure, or 8 ounces of a Glucerna/Ensure equivalent dietary supplement*. B. Wait 15 minutes for symptoms of hypoglycemia (i.e. nervousness, anxiety, sweating, chills, If result is 71 mg/dl to 100 mg/dl: clamminess, irritability, confusion, tachycardia or dizziness). C. If patient asymptomatic, discharge patient. If patient symptomatic, repeat capillary blood glucose (CBG) and notify HBO physician. If result is 101 mg/dl to 249 mg/dl: A. Discharge patient. A. Notify HBO physician and await physician orders. B. It is recommended to do If result is 250 mg/dl or greater: blood/urine ketone testing. C. If the result meets the hospital definition of a critical result, follow hospital policy. *Juice or candies are NOT equivalent products. If patient refuses the Glucerna or Ensure, please consult the hospital dietitian for an appropriate substitute. Electronic Signature(s) Signed: 03/12/2019 5:53:37 PM By: Max Ham MD Signed: 03/13/2019 12:07:41 PM By: Max Coria RN Entered By: [name] on 03/12/2019 16:21:53 -------------------------------------------------------------------------------- Problem List Details Patient Name: Date of Service: [name], Max 03/12/2019 4:00 PM Medical Record [id number] Patient Account Number: [id number] Date of Birth/Sex: Treating RN: 03-27-1986 (33 y.o. Oval Linsey Primary Care Provider: Kandice [name] Other Clinician: Referring Provider: Treating Provider/Extender:Eugena Rhue, [name], [name]: 8 Active Problems ICD-10 Evaluated Encounter Code Description Active Date Today Diagnosis E11.621 Type 2 diabetes mellitus with foot ulcer 01/11/2019 No Yes L97.521 Non-pressure chronic ulcer of other part of left  foot 01/11/2019 No Yes limited to breakdown of skin E11.42 Type 2 diabetes mellitus with diabetic polyneuropathy 01/11/2019 No Yes M86.672 Other chronic  osteomyelitis, left ankle and foot 03/05/2019 No Yes M00.9 Pyogenic arthritis, unspecified 03/05/2019 No Yes Inactive Problems Resolved Problems Electronic Signature(s) Signed: 03/12/2019 5:53:37 PM By: Max Ham MD Entered By: [name] on 03/12/2019 17:18:07 -------------------------------------------------------------------------------- Progress Note Details Patient Name: Date of Service: [name], Max 03/12/2019 4:00 PM Medical Record [id number] Patient Account Number: [id number] Date of Birth/Sex: Treating RN: Jun 04, 1985 (33 y.o. 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 He comes in for review of a wound over the plantar fifth metatarsal head extending into the lateral part of the foot. He was followed for this previously by his podiatrist Dr. Cornelius Moras. As the patient tells his story he went to see podiatry first for a swelling he developed on the lateral part of his fifth metatarsal head in May. He states this was "open" by podiatry and the area closed. He was followed up in June and it was again opened callus removed and it closed promptly. There were plans being made for surgery on the fifth metatarsal head in June however his blood sugar was apparently too high for anesthesia. Apparently the area was debrided and opened again in June and it is never closed since. Looking over the records from podiatry I am really not able to follow this. It was clear when he was first seen it was before 5/14 at that point he already had a wound. By 5/17 the ulcer was resolved. I do not see anything about a procedure. On 5/28 noted to have pre-ulcerative moderate keratosis. X-ray noted 1/5 contracted toe  and tailor's bunion and metatarsal deformity. On a visit date on 09/28/2018 the dorsal part of the left foot it healed and resolved. There was concern about swelling in his lower extremity he was sent to the ER.. As far as I can tell he was seen in the ER on 7/12 with an ulcer on his left foot. A DVT rule out of the left leg was negative. I do not think I have complete records from podiatry but I am not able to verify the procedures this patient states he had. He states after the last procedure the wound has never closed although I am not able to follow this in the records I have from podiatry. He has not had a recent x-ray The patient has been using Neosporin on the wound. He is wearing a Darco shoe. He is still very active up on his foot working and exercising. Past medical history; type 2 diabetes ketosis-prone, leg swelling with a negative DVT study in July. Non-smoker ABI in our clinic was 0.85 on the left 10/16; substantial wound on the plantar left fifth met head extending laterally almost to the dorsal fifth MTP. We have been using silver alginate we gave him a Darco forefoot off loader. An x-ray did not show evidence of osteomyelitis did note soft tissue emphysema which I think was due to gas tracking through an open wound. There is no doubt in my mind he requires an MRI 10/23; MRI not booked until 3 November at the earliest this is largely due to his glucose sensor in the right arm. We have been using silver  alginate. There has been an improvement 10/29; I am still not exactly sure when his MRI is booked for. He says it is the third but it is the 10th in epic. This definitely needs to be done. He is running a low-grade fever today but no other symptoms. No real improvement in the 1 02/26/2019 patient presents today for a follow-up visit here in our clinic he is last been seen in the clinic on October 29. Subsequently we were working on getting MRI to evaluate and see what exactly was  going on and where we would need to go from the standpoint of whether or not he had osteomyelitis and again what treatments were going be required. Subsequently the patient ended up being admitted to the hospital on 02/07/2019 and was discharged on 02/14/2019. This is a somewhat interesting admission with a discharge diagnosis of pneumonia due to COVID-19 although he was positive for COVID-19 when tested at the urgent care but negative x2 when he was actually in the hospital. With that being said he did have acute respiratory failure with hypoxia and it was noted he also have a left foot ulceration with osteomyelitis. With that being said he did require oxygen for his pneumonia and I level 4 L. He was placed on antivirals and steroids for the COVID-19. He was also transferred to the [facility] at one point. Nonetheless he did subsequently discharged home and since being home has done much better in that regard. The CT angiogram did not show any pulmonary embolism. With regard to the osteomyelitis the patient was placed on vancomycin and Zosyn while in the hospital but has been changed to Augmentin at discharge. It was also recommended that he follow-up with wound care and podiatry. Podiatry however wanted him to see [name] according to the patient prior to them doing anything further. His hemoglobin A1c was 9.9 as noted in the hospital. Have an MRI of the left foot performed while in the hospital on 02/04/2019. 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 There is an overlying plantar open wound noted an abscess tracking back along the lateral aspect of the fifth metatarsal shaft. There is otherwise diffuse cellulitis and mild fasciitis without findings of polymyositis. The patient did have recently pneumonia secondary to COVID-19 I looked in the chart through epic and it does appear that the patient  may need to have an additional x-ray just to ensure everything is cleared and that he has no airspace disease prior to putting him into the chamber. 03/05/2019; patient was readmitted to the clinic last week. He was hospitalized twice for a viral upper respiratory tract infection from 11/1 through 11/4 and then 11/5 through 11/12 ultimately this turned out to be Covid pneumonitis. Although he was discharged on oxygen he is not using it. He says he feels fine. He has no exercise limitation no cough no sputum. His O2 sat in our clinic today was 100% on room air. 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 He received Vanco and Zosyn in the hospital and then was discharged on 2 [name] of Augmentin. I do not see any relevant cultures. He was supposed to follow-up with infectious disease but I do not see that he has an appointment. 12/8; patient saw Dr. Novella Olive of infectious disease last week. He felt  that he had had adequate antibiotic therapy. He did not go to follow-up with Dr. Amalia Hailey of podiatry and I have again talked to him about the pros and cons of this. He does not want to consider a ray amputation of this time. He is aware of the risks of recurrence, migration etc. He started HBO today and tolerated this well. He can complete the Augmentin that I gave him last week. I have looked over the lab work that Dr. Chana Bode ordered his C-reactive protein was 3.3 and his sedimentation rate was 17. The C-reactive protein is never really been measurably that high in this patient Objective Constitutional Sitting or standing Blood Pressure is within target range for patient.. Pulse regular and within target range for patient.Marland Kitchen Respirations regular, non-labored and within target range.. Temperature is normal and within the target range for the patient.Marland Kitchen Appears in no distress. Vitals Time Taken:  4:18 PM, Height: 77 in, Weight: 260 lbs, BMI: 30.8, Temperature: 98.4 F, Pulse: 99 bpm, Respiratory Rate: 20 breaths/min, Blood Pressure: 128/73 mmHg. General Notes: Wound exam; the patient's wound is on the left fifth plantar metatarsal head there is undermining superiorly although that is somewhat better. No purulent drainage is seen. Thick skin and callus from around the wound edges removed with a #5 curette Integumentary (Hair, Skin) Wound #1 status is Open. Original cause of wound was Trauma. The wound is located on the Left Metatarsal head fifth. The wound measures 1.9cm length x 2.4cm width x 0.2cm depth; 3.581cm^2 area and 0.716cm^3 volume. There is Fat Layer (Subcutaneous Tissue) Exposed exposed. There is no tunneling noted, however, there is undermining starting at 7:00 and ending at 11:00 with a maximum distance of 1.5cm. There is a medium amount of serosanguineous drainage noted. The wound margin is well defined and not attached to the wound base. There is large (67-100%) pink granulation within the wound bed. There is no necrotic tissue within the wound bed. 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 There was a Selective/Open Wound Skin/Epidermis Debridement with a total area of 4.56 sq cm performed by [name]., MD. With the following instrument(s): Curette to remove Viable and Non-Viable tissue/material. Material removed includes Slough, Skin: Dermis, and Skin: Epidermis after achieving pain control using Other  (benzocaine). No specimens were taken. A time out was conducted at 17:02, prior to the start of the procedure. A Moderate amount of bleeding was controlled with Silver Nitrate. The procedure was tolerated well with a pain level of 0 throughout and a pain level of 0 following the procedure. Post Debridement Measurements: 1.9cm length x 2.4cm width x 0.2cm depth; 0.716cm^3 volume. Character of Wound/Ulcer Post Debridement is improved. Severity of Tissue Post Debridement is: Fat layer exposed. Post procedure Diagnosis Wound #1: Same as Pre-Procedure Plan Follow-up Appointments: Return Appointment in 1 week. Dressing Change Frequency: Wound #1 Left Metatarsal head fifth: Change dressing every day. Skin Barriers/Peri-Wound Care: Moisturizing lotion - to both legs daily. Wound Cleansing: Wound #1 Left Metatarsal head fifth: May shower and wash wound with soap and water. Primary Wound Dressing: Wound #1 Left Metatarsal head fifth: Calcium  Alginate with Silver Secondary Dressing: Wound #1 Left Metatarsal head fifth: Kerlix/Rolled Gauze Dry Gauze Other: - felt callous pad Off-Loading: Wedge shoe to: - front offloading darco to left foot Hyperbaric Oxygen Therapy: Wound #1 Left Metatarsal head fifth: Evaluate for HBO Therapy Indication: - osteomylitis left foot If appropriate for treatment, begin HBOT per protocol: 2.0 ATA for 90 Minutes without Air Breaks 2.5 ATA for 90 Minutes with 2 Five (5) Minute Air Breaks Total Number of Treatments: - 40 One treatments per day (delivered Monday through Friday unless otherwise specified in Special Instructions below): Finger stick Blood Glucose Pre- and Post- HBOT Treatment. Follow Hyperbaric Oxygen Glycemia Protocol 1. The patient will complete antibiotics that we have given him as directed by Dr. Novella Olive 2. He started hyperbarics today 3. I am going to give this a week likely a total contact cast next week. 4. I have talked to him again about  surgery including the benefits of definitive therapy, lack of risk of recurrence and spread. He wants to go ahead with noninvasive treatment Electronic Signature(s) Signed: 03/12/2019 5:53:37 PM By: Max Ham MD Entered By: [name] on 03/12/2019 17:23:05 -------------------------------------------------------------------------------- SuperBill Details Patient Name: Date of Service: [name], Max 03/12/2019 Medical Record [id number] Patient Account Number: [id number] Date of Birth/Sex: Treating RN: 06-15-1985 (33 y.o. [name]) Dolores Lory, Morey Hummingbird Primary Care Provider: Kandice [name] Other Clinician: Referring Provider: Treating Provider/Extender:Khadim Lundberg, [name], [name] in Treatment: 8 Diagnosis Coding ICD-10 Codes Code Description E11.621 Type 2 diabetes mellitus with foot ulcer L97.521 Non-pressure chronic ulcer of other part of left foot limited to breakdown of skin E11.42 Type 2 diabetes mellitus with diabetic polyneuropathy M86.672 Other chronic osteomyelitis, left ankle and foot M00.9 Pyogenic arthritis, unspecified Facility Procedures CPT4 Code Description: [id number] - DEBRIDE WOUND 1ST 20 SQ CM OR < ICD-10 Diagnosis Description L97.521 Non-pressure chronic ulcer of other part of left foot limited Modifier: to breakdo Quantity: 1 wn of skin Physician Procedures CPT4 Code Description: 2998069 [id number] - WC PHYS DEBR WO ANESTH 20 SQ CM ICD-10 Diagnosis Description L97.521 Non-pressure chronic ulcer of other part of left foot limited Modifier: to breakdow Quantity: 1 n of skin Electronic Signature(s) Signed: 03/12/2019 5:53:37 PM By: Max Ham MD Entered By: [name] on 03/12/2019 17:23:15

## 2019-03-13 NOTE — Progress Notes (Addendum)
[name] [name] ([id number]) Visit Report for 03/12/2019 Arrival Information Details Patient Name: Date of Service: [name] [name] 03/12/2019 4:00 PM Medical Record [id number] Patient Account Number: [id number] Date of Birth/Sex: Treating RN: 1985-06-29 (33 y.o. [name] [name] [name] [name] Primary Care [name] [name]: [name] [name] Other Clinician: Referring [name] [name]: Treating [name] [name]/Extender:[name] [name] [name] [name] in Treatment: 8 Visit Information History Since Last Visit All ordered tests and consults were Yes Patient Arrived: Ambulatory completed: Arrival Time: 16:15 Added or deleted any medications: No Accompanied By: self Any new allergies or adverse reactions: No Transfer Assistance: None [name] a fall or experienced change in No Patient Identification Verified: Yes activities of daily living that may affect Secondary Verification Process Yes risk of falls: Completed: Signs or symptoms of abuse/neglect since No Patient Requires Transmission-Based No last visito Precautions: Hospitalized since last visit: No Patient Has Alerts: No Implantable device outside of the clinic No excluding cellular tissue based products placed in the center since last visit: Has Dressing in Place as Prescribed: Yes Has Footwear/Offloading in Place as Yes Prescribed: Left: Wedge Shoe Pain Present Now: No Electronic Signature(s) Signed: 03/12/2019 5:32:49 PM By: [name] [name] Entered By: [name] [name] on 03/12/2019 16:17:19 -------------------------------------------------------------------------------- Encounter Discharge Information Details Patient Name: Date of Service: [name] [name] 03/12/2019 4:00 PM Medical Record Number:[id number] Patient Account Number: [id number] Date of Birth/Sex: Treating RN: 11/26/1985 (33 y.o. [name] [name] Primary Care Kaity Pitstick: [name] [name] Other Clinician: Referring [name] [name]: Treating [name] [name]/Extender:[name] [name] [name] [name] in Treatment: 8 Encounter Discharge Information Items Post Procedure Vitals Discharge Condition: Stable Temperature (F): 98.4 Ambulatory Status: Ambulatory Pulse (bpm): 99 Discharge Destination: Home Respiratory Rate (breaths/min): 20 Transportation: Private Auto Blood Pressure (mmHg): 128/73 Accompanied By: self Schedule Follow-up Appointment: Yes Clinical Summary of Care: Patient Declined Electronic Signature(s) Signed: 03/13/2019 12:04:56 PM By: [name] [name] Entered By: [name] [name] on 03/12/2019 17:28:42 -------------------------------------------------------------------------------- Lower Extremity Assessment Details Patient Name: Date of Service: [name] [name] 03/12/2019 4:00 PM Medical Record [id number] Patient Account Number: [id number] Date of Birth/Sex: Treating RN: 23-Oct-1985 (33 y.o. [name] [name] Primary Care [name] [name]: [name] [name] Other Clinician: Referring [name] [name]: Treating [name] [name]/Extender:[name] [name] [name] [name] Weeks in Treatment: 8 Edema Assessment Assessed: [Left: Yes] [Right: No] Edema: [Left: Ye] [Right: s] Calf Left: Right: Point of Measurement: 48 cm From Medial Instep 43 cm cm Ankle Left: Right: Point of Measurement: 12 cm From Medial Instep 31 cm cm Vascular Assessment Pulses: Dorsalis Pedis Palpable: [Left:Yes] Electronic Signature(s) Signed: 03/12/2019 5:32:49 PM By: [name] [name] Entered By: [name] [name] on 03/12/2019 16:20:53 -------------------------------------------------------------------------------- Multi Wound Chart Details Patient Name: Date of Service: [name] [name] 03/12/2019 4:00 PM Medical Record [id number] Patient Account Number: [id number] Date of Birth/Sex: Treating RN: 10/24/1985 (33 y.o. Jerilynn Mages) [name] [name] Primary Care [name] [name]: [name] [name] Other Clinician: Referring Allyah Heather: Treating [name] [name]/Extender:[name] [name] [name] [name] Weeks in Treatment: 8 Vital  Signs Height(in): 77 Pulse(bpm): 99 Weight(lbs): 260 Blood Pressure(mmHg): 128/73 Body Mass Index(BMI): 31 Temperature(F): 98.4 Respiratory 20 Rate(breaths/min): Photos: [1:No Photos] [N/A:N/A] Wound Location: [1:Left Metatarsal head fifth N/A] Wounding Event: [1:Trauma] [N/A:N/A] Primary Etiology: [1:Diabetic Wound/Ulcer of the N/A Lower Extremity] Comorbid History: [1:Type II Diabetes] [N/A:N/A] Date Acquired: [1:08/03/2018] [N/A:N/A] Weeks of Treatment: [1:8] [N/A:N/A] Wound Status: [1:Open] [N/A:N/A] Measurements L x W x D 1.9x2.4x0.2 [N/A:N/A] (cm) Area (cm) : [1:3.581] [N/A:N/A] Volume (cm) : [1:0.716] [N/A:N/A] % Reduction in Area: [1:-21.90%] [N/A:N/A] % Reduction in Volume: 69.50% [N/A:N/A] Starting Position 1 7 (o'clock): Ending Position 1 [1:11] (o'clock): Maximum Distance 1 [1:1.5] (cm): Undermining: [1:Yes] [  N/A:N/A] Classification: [1:Grade 2] [N/A:N/A] Exudate Amount: [1:Medium] [N/A:N/A] Exudate Type: [1:Serosanguineous] [N/A:N/A] Exudate Color: [1:red, brown] [N/A:N/A] Wound Margin: [1:Well defined, not attached N/A] Granulation Amount: [1:Large (67-100%)] [N/A:N/A] Granulation Quality: [1:Pink] [N/A:N/A] Necrotic Amount: [1:None Present (0%)] [N/A:N/A] Exposed Structures: [1:Fat Layer (Subcutaneous N/A Tissue) Exposed: Yes Fascia: No Tendon: No Muscle: No Joint: No Bone: No] Epithelialization: [1:Small (1-33%)] [N/A:N/A] Debridement: [1:Debridement - Excisional] [N/A:N/A] Pre-procedure [1:17:02] [N/A:N/A] Verification/Time Out Taken: Pain Control: [1:Other] [N/A:N/A] Tissue Debrided: [1:Subcutaneous, Slough] [N/A:N/A] Level: [1:Skin/Subcutaneous Tissue] [N/A:N/A] Debridement Area (sq cm):4.56 [N/A:N/A] Instrument: [1:Curette] [N/A:N/A] Bleeding: [1:Moderate] [N/A:N/A] Hemostasis Achieved: [1:Silver Nitrate] [N/A:N/A] Procedural Pain: [1:0] [N/A:N/A] Post Procedural Pain: [1:0] [N/A:N/A] Debridement Treatment Procedure was tolerated  [N/A:N/A] Response: [1:well] Post Debridement [1:1.9x2.4x0.2] [N/A:N/A] Measurements L x W x D (cm) Post Debridement [1:0.716] [N/A:N/A] Volume: (cm) Procedures Performed: Debridement [N/A:N/A] Treatment Notes Electronic Signature(s) Signed: 03/12/2019 5:53:37 PM By: Baltazar Najjarobson, Michael MD Signed: 03/13/2019 12:07:41 PM By: Yevonne PaxEpps, Carrie RN Entered By: Baltazar Najjarobson, Michael on 03/12/2019 17:18:15 -------------------------------------------------------------------------------- Multi-Disciplinary Care Plan Details Patient Name: Date of Service: [name], [name] 03/12/2019 4:00 PM Medical Record Number:[id number] Patient Account Number: [id number] Date of Birth/Sex: Treating RN: 05-18-1985 (33 y.o. Lina SarM) Epps, Lyla Sonarrie Primary Care Jewett Mcgann: Katy ApoPolite, Ronald D Other Clinician: Referring [name]: Treating Taisia Fantini/Extender:Robson, Almira CoasterMichael [name], Deirdre Peeronald D Weeks in Treatment: 8 Active Inactive Wound/Skin Impairment Nursing Diagnoses: Impaired tissue integrity Knowledge deficit related to ulceration/compromised skin integrity Goals: Patient/caregiver will verbalize understanding of skin care regimen Date Initiated: 01/11/2019 Target Resolution Date: 04/05/2019 Goal Status: Active Ulcer/skin breakdown will have a volume reduction of 30% by week 4 Date Initiated: 01/11/2019 Date Inactivated: 02/26/2019 Target Resolution Date: 02/08/2019 Goal Status: Unmet Unmet Reason: comorbities Ulcer/skin breakdown will have a volume reduction of 50% by week 8 Date Initiated: 02/26/2019 Target Resolution Date: 03/15/2019 Goal Status: Active Interventions: Assess patient/caregiver ability to obtain necessary supplies Assess patient/caregiver ability to perform ulcer/skin care regimen upon admission and as needed Assess ulceration(s) every visit Provide education on ulcer and skin care Notes: Electronic Signature(s) Signed: 03/13/2019 12:07:41 PM By: Yevonne PaxEpps, Carrie RN Entered By: Yevonne PaxEpps, Carrie on 03/12/2019  16:22:22 -------------------------------------------------------------------------------- Pain Assessment Details Patient Name: Date of Service: [name], [name] 03/12/2019 4:00 PM Medical Record Number:[id number] Patient Account Number: [id number] Date of Birth/Sex: Treating RN: 05-18-1985 (33 y.o. [name]) Deaton, Bobbi Primary Care Tamula Morrical: Katy ApoPolite, Ronald D Other Clinician: Referring Payton Moder: Treating Daleyssa Loiselle/Extender:Robson, Almira CoasterMichael [name], Chalmers Guestonald D Weeks in Treatment: 8 Active Problems Location of Pain Severity and Description of Pain Patient Has Paino No Site Locations Rate the pain. Current Pain Level: 0 Pain Management and Medication Current Pain Management: Medication: No Cold Application: No Rest: No Massage: No Activity: No T.E.N.S.: No Heat Application: No Leg drop or elevation: No Is the Current Pain Management Adequate: Adequate How does your wound impact your activities of daily livingo Sleep: No Bathing: No Appetite: No Relationship With Others: No Bladder Continence: No Emotions: No Bowel Continence: No Work: No Toileting: No Drive: No Dressing: No Hobbies: No Electronic Signature(s) Signed: 03/12/2019 5:32:49 PM By: Shawn Stalleaton, Bobbi Entered By: Shawn Stalleaton, Bobbi on 03/12/2019 16:19:51 -------------------------------------------------------------------------------- Patient/Caregiver Education Details Patient Name: Date of Service: [name], [name] 12/8/2020andnbsp4:00 PM Medical Record Number:[id number] Patient Account Number: [id number] Date of Birth/Gender: Treating RN: 05-18-1985 (33 y.o. [name]) Epps, Carrie Primary Care Physician: Katy ApoPolite, Ronald D Other Clinician: Referring Physician: Treating Physician/Extender:Robson, Almira CoasterMichael [name], Chalmers Guestonald D Weeks in Treatment: 8 Education Assessment Education Provided To: Patient Education Topics Provided Wound/Skin Impairment: Methods: Explain/Verbal Responses: State content correctly Electronic  Signature(s) Signed: 03/13/2019 12:07:41 PM By: Yevonne PaxEpps, Carrie  RN Entered By: Yevonne Pax on 03/12/2019 16:22:55 -------------------------------------------------------------------------------- Wound Assessment Details Patient Name: Date of Service: [name] [name] 03/12/2019 4:00 PM Medical Record Number:[id number] Patient Account Number: [id number] Date of Birth/Sex: Treating RN: 04-03-1986 (33 y.o. [name] Primary Care Fynn Vanblarcom: Katy Apo Other Clinician: Referring Kellyanne Ellwanger: Treating Ryenn Howeth/Extender:Robson, Almira Coaster, Chalmers Guest in Treatment: 8 Wound Status Wound Number: 1 Primary Diabetic Wound/Ulcer of the Lower Etiology: Extremity Wound Location: Left Metatarsal head fifth Wound Status: Open Wounding Event: Trauma Comorbid Type II Diabetes Date Acquired: 08/03/2018 History: Weeks Of Treatment: 8 Clustered Wound: No Photos Wound Measurements Length: (cm) 1.9 % Reduction in A Width: (cm) 2.4 % Reduction in V Depth: (cm) 0.2 Epithelializatio Area: (cm) 3.581 Tunneling: Volume: (cm) 0.716 Undermining: Starting Posi Ending Positi Maximum Dista rea: -21.9% olume: 69.5% n: Small (1-33%) No Yes tion (o'clock): 7 on (o'clock): 11 nce: (cm) 1.5 Wound Description Classification: Grade 2 Foul Odor After Wound Margin: Well defined, not attached Slough/Fibrino Exudate Amount: Medium Exudate Type: Serosanguineous Exudate Color: red, brown Wound Bed Granulation Amount: Large (67-100%) Granulation Quality: Pink Fascia Exposed: Necrotic Amount: None Present (0%) Fat Layer (Subcu Tendon Exposed: Muscle Exposed: Joint Exposed: Bone Exposed: Cleansing: No No Exposed Structure No taneous Tissue) Exposed: Yes No No No No Treatment Notes Wound #1 (Left Metatarsal head fifth) 1. Cleanse With Wound Cleanser 2. Periwound Care Skin Prep 3. Primary Dressing Applied Calcium Alginate Ag 4. Secondary Dressing Dry Gauze Roll Gauze 5.  Secured With Tape 7. Footwear/Offloading device applied Felt/Foam Electronic Signature(s) Signed: 03/14/2019 3:39:48 PM By: [name] EMT/HBOT Signed: 03/14/2019 5:17:33 PM By: Shawn Stall Previous Signature: 03/12/2019 5:32:49 PM Version By: Shawn Stall Entered By: [name] on 03/14/2019 13:33:46 -------------------------------------------------------------------------------- Vitals Details Patient Name: Date of Service: [name] [name] 03/12/2019 4:00 PM Medical Record Number:[id number] Patient Account Number: [id number] Date of Birth/Sex: Treating RN: [birth year] (33 y.o. [name] Primary Care Gillie Fleites: Katy Apo Other Clinician: Referring Haji Delaine: Treating Gailen Venne/Extender:Robson, Almira Coaster, Chalmers Guest in Treatment: 8 Vital Signs Time Taken: 16:18 Temperature (F): 98.4 Height (in): 77 Pulse (bpm): 99 Weight (lbs): 260 Respiratory Rate (breaths/min): 20 Body Mass Index (BMI): 30.8 Blood Pressure (mmHg): 128/73 Reference Range: 80 - 120 mg / dl Electronic Signature(s) Signed: 03/12/2019 5:32:49 PM By: Shawn Stall Entered By: Shawn Stall on 03/12/2019 16:19:37

## 2019-03-13 NOTE — Progress Notes (Signed)
Winebarger, [name] ([id number]) Visit Report for 02/26/2019 Arrival Information Details Patient Name: Date of Service: [name], [name] 02/26/2019 10:30 AM Medical Record Number:[id number] Patient Account Number: [id number] Date of Birth/Sex: Treating RN: 10/02/1985 (33 y.o. [name] Primary Care Keenan Trefry: Katy Apo Other Clinician: Referring Nialah Saravia: Treating Anya Murphey/Extender:Stone III, Aleda Grana, Chalmers Guest in Treatment: 6 Visit Information History Since Last Visit Added or deleted any medications: No Patient Arrived: Ambulatory Any new allergies or adverse reactions: No Arrival Time: 10:31 Had a fall or experienced change in No Accompanied By: self activities of daily living that may affect Transfer Assistance: None risk of falls: Patient Identification Verified: Yes Signs or symptoms of abuse/neglect since No Secondary Verification Process Yes last visito Completed: Hospitalized since last visit: Yes Patient Requires Transmission-Based No Implantable device outside of the clinic No Precautions: excluding Patient Has Alerts: No cellular tissue based products placed in the center since last visit: Has Dressing in Place as Prescribed: Yes Has Footwear/Offloading in Place as Yes Prescribed: Left: Wedge Shoe Pain Present Now: No Electronic Signature(s) Signed: 02/26/2019 2:36:36 PM By: Shawn Stall Entered By: Shawn Stall on 02/26/2019 10:37:16 -------------------------------------------------------------------------------- Encounter Discharge Information Details Patient Name: Date of Service: [name], [name] 02/26/2019 10:30 AM Medical Record Number:[id number] Patient Account Number: [id number] Date of Birth/Sex: Treating RN: [birth year] (33 y.o. [name] Primary Care Cheryllynn Sarff: Katy Apo Other Clinician: Referring Kalid Ghan: Treating Yola Paradiso/Extender:Stone III, Aleda Grana, Chalmers Guest in Treatment: 6 Encounter Discharge  Information Items Post Procedure Vitals Discharge Condition: Stable Temperature (F): 97 Ambulatory Status: Ambulatory Pulse (bpm): 112 Discharge Destination: Home Respiratory Rate (breaths/min): 18 Transportation: Private Auto Blood Pressure (mmHg): 130/80 Accompanied By: self Schedule Follow-up Appointment: Yes Clinical Summary of Care: Electronic Signature(s) Signed: 02/26/2019 2:36:36 PM By: Shawn Stall Entered By: Shawn Stall on 02/26/2019 11:49:03 -------------------------------------------------------------------------------- Lower Extremity Assessment Details Patient Name: Date of Service: [name], [name] 02/26/2019 10:30 AM Medical Record Number:[id number] Patient Account Number: [id number] Date of Birth/Sex: Treating RN: 10/12/1985 (33 y.o. [name] Primary Care Labria Wos: Katy Apo Other Clinician: Referring Edrick Whitehorn: Treating Kenedi Cilia/Extender:Stone III, Aleda Grana, Deirdre Peer Weeks in Treatment: 6 Edema Assessment Assessed: [Left: Yes] [Right: No] Edema: [Left: Ye] [Right: s] Calf Left: Right: Point of Measurement: 48 cm From Medial Instep 42 cm cm Ankle Left: Right: Point of Measurement: 12 cm From Medial Instep 27 cm cm Vascular Assessment Pulses: Dorsalis Pedis Palpable: [Left:Yes] Electronic Signature(s) Signed: 02/26/2019 2:36:36 PM By: Shawn Stall Entered By: Shawn Stall on 02/26/2019 10:49:39 -------------------------------------------------------------------------------- Multi-Disciplinary Care Plan Details Patient Name: Date of Service: [name], [name] 02/26/2019 10:30 AM Medical Record Number:[id number] Patient Account Number: [id number] Date of Birth/Sex: Treating RN: Jun 23, 1985 (33 y.o. [name] Primary Care Yolani Vo: Katy Apo Other Clinician: Referring Joye Wesenberg: Treating Jakira Mcfadden/Extender:Stone III, Aleda Grana, Deirdre Peer Weeks in Treatment: 6 Active Inactive Wound/Skin Impairment Nursing  Diagnoses: Impaired tissue integrity Knowledge deficit related to ulceration/compromised skin integrity Goals: Patient/caregiver will verbalize understanding of skin care regimen Date Initiated: 01/11/2019 Target Resolution Date: 03/08/2019 Goal Status: Active Ulcer/skin breakdown will have a volume reduction of 30% by week 4 Date Initiated: 01/11/2019 Date Inactivated: 02/26/2019 Target Resolution Date: 02/08/2019 Goal Status: Unmet Unmet Reason: comorbities Ulcer/skin breakdown will have a volume reduction of 50% by week 8 Date Initiated: 02/26/2019 Target Resolution Date: 03/15/2019 Goal Status: Active Interventions: Assess patient/caregiver ability to obtain necessary supplies Assess patient/caregiver ability to perform ulcer/skin care regimen upon admission and as needed Assess ulceration(s) every visit Provide education on ulcer and skin care  Notes: Electronic Signature(s) Signed: 03/12/2019 2:55:30 PM By: Yevonne PaxEpps, Carrie RN Entered By: Yevonne PaxEpps, Carrie on 02/26/2019 11:11:34 -------------------------------------------------------------------------------- Pain Assessment Details Patient Name: Date of Service: [name], [name] 02/26/2019 10:30 AM Medical Record Number:[id number] Patient Account Number: [id number] Date of Birth/Sex: Treating RN: 17-Aug-1985 (33 y.o. Max SoursM) Deaton, Bobbi Primary Care [place] Lesure: Katy ApoPolite, Ronald D Other Clinician: Referring Tremon Sainvil: Treating Sharlyn Odonnel/Extender:Stone III, Aleda GranaHoyt [name], Deirdre Peeronald D Weeks in Treatment: 6 Active Problems Location of Pain Severity and Description of Pain Patient Has Paino No Site Locations Rate the pain. Current Pain Level: 0 Pain Management and Medication Current Pain Management: Medication: No Cold Application: No Rest: No Massage: No Activity: No T.E.N.S.: No Heat Application: No Leg drop or elevation: No Is the Current Pain Management Adequate: Adequate How does your wound impact your activities of daily livingo Sleep:  No Bathing: No Appetite: No Relationship With Others: No Bladder Continence: No Emotions: No Bowel Continence: No Work: No Toileting: No Drive: No Dressing: No Hobbies: No Electronic Signature(s) Signed: 02/26/2019 2:36:36 PM By: Shawn Stalleaton, Bobbi Entered By: Shawn Stalleaton, Bobbi on 02/26/2019 10:39:24 -------------------------------------------------------------------------------- Patient/Caregiver Education Details Patient Name: Petro, [name] 11/24/2020andnbsp10:30 Date of Service: AM Medical Record [id number] Number: Patient Account Number: [id number] Treating RN: Date of Birth/Gender: 17-Aug-1985 (33 y.o. Melonie FloridaEpps, Carrie M) Other Clinician: Primary Care Treating [name], Adine Maduraonald D Stone III, Hoyt Physician: Physician/Extender: Referring Physician: Renee RamusPolite, Ronald D Weeks in Treatment: 6 Education Assessment Education Provided To: Patient Education Topics Provided Wound/Skin Impairment: Methods: Explain/Verbal Responses: State content correctly Electronic Signature(s) Signed: 03/12/2019 2:55:30 PM By: Yevonne PaxEpps, Carrie RN Entered By: Yevonne PaxEpps, Carrie on 02/26/2019 11:09:35 -------------------------------------------------------------------------------- Wound Assessment Details Patient Name: Date of Service: [place], [name] 02/26/2019 10:30 AM Medical Record Number:[id number] Patient Account Number: [id number] Date of Birth/Sex: Treating RN: 17-Aug-1985 (33 y.o. Max SoursM) Deaton, Bobbi Primary Care Khayla Koppenhaver: Katy ApoPolite, Ronald D Other Clinician: Referring Tayton Decaire: Treating Cece Milhouse/Extender:Stone III, Aleda GranaHoyt [name], Deirdre Peeronald D Weeks in Treatment: 6 Wound Status Wound Number: 1 Primary Diabetic Wound/Ulcer of the Lower Etiology: Extremity Wound Location: Left Metatarsal head fifth Wound Status: Open Wounding Event: Trauma Comorbid Type II Diabetes Date Acquired: 08/03/2018 History: Weeks Of Treatment: 6 Clustered Wound: No Photos Wound Measurements Length: (cm) 2.4 Width: (cm) 2.5 Depth: (cm)  0.9 Area: (cm) 4.712 Volume: (cm) 4.241 % Reduction in Area: -60.4% % Reduction in Volume: -80.5% Epithelialization: None Tunneling: No Undermining: Yes Starting Position (o'clock): 12 Ending Position (o'clock): 12 Maximum Distance: (cm) 1.2 Wound Description Classification: Grade 2 Foul Odor Wound Margin: Well defined, not attached Slough/Fi Exudate Amount: Medium Exudate Type: Serosanguineous Exudate Color: red, brown Wound Bed Granulation Amount: Large (67-100%) Granulation Quality: Red, Pink Fascia Exp Necrotic Amount: None Present (0%) Fat Layer Tendon Exp Muscle Exp Joint Expo Bone Expos After Cleansing: No brino Yes Exposed Structure osed: No (Subcutaneous Tissue) Exposed: Yes osed: No osed: No sed: No ed: No Assessment Notes callus to periwound. Electronic Signature(s) Signed: 03/08/2019 4:14:15 PM By: Benjaman KindlerJones, Dedrick EMT/HBOT Signed: 03/13/2019 12:05:07 PM By: Shawn Stalleaton, Bobbi Previous Signature: 02/26/2019 2:36:36 PM Version By: Shawn Stalleaton, Bobbi Entered By: Benjaman KindlerJones, Dedrick on 03/08/2019 14:59:15 -------------------------------------------------------------------------------- Vitals Details Patient Name: Date of Service: [place]ehan, [name] 02/26/2019 10:30 AM Medical Record Number:[id number] Patient Account Number: [id number] Date of Birth/Sex: Treating RN: 17-Aug-1985 (33 y.o. [name]) Deaton, Millard.LoaBobbi Primary Care Lenda Baratta: Katy ApoPolite, Ronald D Other Clinician: Referring Breland Trouten: Treating Kaelem Brach/Extender:Stone III, Aleda GranaHoyt [name], Deirdre Peeronald D Weeks in Treatment: 6 Vital Signs Time Taken: 10:38 Temperature (F): 97 Height (in): 77 Pulse (bpm): 112 Weight (lbs): 260 Respiratory Rate (breaths/min): 18 Body Mass Index (BMI): 30.8  Blood Pressure (mmHg): 130/80 Reference Range: 80 - 120 mg / dl Electronic Signature(s) Signed: 02/26/2019 2:36:36 PM By: Deon Pilling Entered By: Deon Pilling on 02/26/2019 10:39:16

## 2019-03-14 ENCOUNTER — Encounter (HOSPITAL_BASED_OUTPATIENT_CLINIC_OR_DEPARTMENT_OTHER): Payer: BC Managed Care – PPO | Admitting: Internal Medicine

## 2019-03-14 ENCOUNTER — Other Ambulatory Visit: Payer: Self-pay

## 2019-03-14 DIAGNOSIS — E11621 Type 2 diabetes mellitus with foot ulcer: Secondary | ICD-10-CM | POA: Diagnosis not present

## 2019-03-14 LAB — GLUCOSE, CAPILLARY
Glucose-Capillary: 288 mg/dL — ABNORMAL HIGH (ref 70–99)
Glucose-Capillary: 277 mg/dL — ABNORMAL HIGH (ref 70–99)

## 2019-03-14 NOTE — Progress Notes (Signed)
[facility], [name] ([id number]) Visit Report for 03/14/2019 SuperBill Details Patient Name: Date of Service: [name], [name] 03/14/2019 Medical Record [id number] Patient Account Number: [id number] Date of Birth/Sex: Treating RN: 01-Dec-1985 (33 y.o. 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

## 2019-03-14 NOTE — Progress Notes (Signed)
[name] [name] ([id number]) Visit Report for 03/14/2019 Arrival Information Details Patient Name: Date of Service: [name] [name] 03/14/2019 10:00 AM Medical Record [id number] Patient Account Number: [id number] Date of Birth/Sex: Treating RN: [birth year] (33 y.o. M) Primary Care Sharmane Dame: [name] Other Clinician: [name] Referring Demaurion Dicioccio: Treating Alwin Lanigan/Extender:Robson, [name], [name]: 8 Visit Information History Since Last Visit Added or deleted any medications: No Patient Arrived: Ambulatory Any new allergies or adverse reactions: No Arrival Time: 10:15 Had a fall or experienced change in No Accompanied By: self activities of daily living that may affect Transfer Assistance: None risk of falls: Patient Identification Verified: Yes Signs or symptoms of abuse/neglect since last No Secondary Verification Process Yes visito Completed: Hospitalized since last visit: No Patient Requires Transmission-Based No Implantable device outside of the clinic excluding No Precautions: cellular tissue based products placed in the center Patient Has Alerts: No since last visit: Pain Present Now: No Electronic Signature(s) Signed: 03/14/2019 3:39:48 PM By: [name] EMT/HBOT Entered By: [name] on 03/14/2019 10:58:13 -------------------------------------------------------------------------------- Encounter Discharge Information Details Patient Name: Date of Service: [name] [name] 03/14/2019 10:00 AM Medical Record Number:[id number] Patient Account Number: [id number] Date of Birth/Sex: Treating RN: 07/05/1985 (33 y.o. M) Primary Care Moss Berry: [name] Other Clinician: [name] Referring Roland Lipke: Treating Moorea Boissonneault/Extender:Robson, [name], [name]: 8 Encounter Discharge Information Items Discharge Condition: Stable Ambulatory Status: Ambulatory Discharge Destination:  Home Transportation: Private Auto Accompanied By: self Schedule Follow-up Appointment: Yes Clinical Summary of Care: Patient Declined Electronic Signature(s) Signed: 03/14/2019 3:39:48 PM By: [name] EMT/HBOT Entered By: [name] on 03/14/2019 13:09:18 -------------------------------------------------------------------------------- Patient/Caregiver Education Details Patient Name: [name] [name] 12/10/2020andnbsp10:00 Date of Service: AM Medical Record [id number] Number: Patient Account Number: [id number] Treating RN: [birth year] (33 y.o. Date of Birth/Gender: M) Other Clinician: [name] Primary Care Treating [name], Turner Daniels Physician: Physician/Extender: Referring Physician: Smith Mince in Treatment: 8 Education Assessment Education Provided To: Patient Education Topics Provided Hyperbaric Oxygenation: Methods: Explain/Verbal Responses: State content correctly Electronic Signature(s) Signed: 03/14/2019 3:39:48 PM By: [name] EMT/HBOT Entered By: [name] on 03/14/2019 13:09:06 -------------------------------------------------------------------------------- Vitals Details Patient Name: Date of Service: [name] [name] 03/14/2019 10:00 AM Medical Record [id number] Patient Account Number: [id number] Date of Birth/Sex: Treating RN: 06-Oct-1985 (33 y.o. M) Primary Care Kelli Egolf: [name] Other Clinician: [name] Referring Davide Risdon: Treating Sahara Fujimoto/Extender:Robson, [name], [name]: 8 Vital Signs Time Taken: 10:20 Temperature (F): 98 Height (in): 77 Pulse (bpm): 108 Weight (lbs): 260 Respiratory Rate (breaths/min): 16 Body Mass Index (BMI): 30.8 Blood Pressure (mmHg): 141/80 Capillary Blood Glucose (mg/dl): 288 Reference Range: 80 - 120 mg / dl Electronic Signature(s) Signed: 03/14/2019 3:39:48 PM By: [name] EMT/HBOT Entered By: [name]  on 03/14/2019 10:58:35

## 2019-03-14 NOTE — Progress Notes (Addendum)
Shaddock, [name] ([id number]) Visit Report for 03/14/2019 HBO Details Patient Name: Date of Service: [place], [name] 03/14/2019 10:00 AM Medical Record Number:[id number] Patient Account Number: [id number] Date of Birth/Sex: Treating RN: [birth year] (33 y.o. M) Primary Care Yandiel Bergum: Katy Apo Other Clinician: [name] Referring Rosibel Giacobbe: Treating Carleigh Buccieri/Extender:Robson, Almira Coaster, Chalmers Guest in Treatment: 8 HBO Treatment Course Details Treatment Course Number: 1 Ordering Darice Vicario: Baltazar Najjar Total Treatments Ordered: 40 HBO Treatment Start Date: 03/12/2019 HBO Indication: Diabetic Ulcer(s) of the Lower Extremity HBO Treatment Details Treatment Number: 2 Patient Type: Outpatient Chamber Type: Monoplace Chamber Serial #: [id number] Treatment Protocol: 2.0 ATA with 90 minutes oxygen, and no air breaks Treatment Details Compression Rate Down: 1.5 psi / minute De-Compression Rate Up: 2.0 psi / minute Air breaks and CompressTx Pressure breathing DecompressDecompress Begins Reached periods Begins Ends (leave unused spaces blank) Chamber Pressure (ATA)1 2 ------2 1 Clock Time (24 hr) 10:27 10:37 - - - - - - 12:07 12:15 Treatment Length: 108 (minutes) Treatment Segments: 4 Vital Signs Capillary Blood Glucose Reference Range: 80 - 120 mg / dl HBO Diabetic Blood Glucose Intervention Range: <131 mg/dl or >924 mg/dl Time Vitals Blood Respiratory Capillary Blood Glucose Pulse Action Type: Pulse: Temperature: Taken: Pressure: Rate: Glucose (mg/dl): Meter #: Oximetry (%) Taken: Pre 10:20 141/80 108 16 98 288 Post 12:17 148/81 87 14 98.4 277 Treatment Response Treatment Toleration: Well Treatment Completion Treatment Completed without Adverse Event Status: Additional Procedure Documentation Tissue Sevierity: Necrosis of bone Trayquan Kolakowski Notes No concerns with treatment given Physician HBO Attestation: I certify that I supervised this HBO treatment  in accordance with Medicare guidelines. A trained Yes emergency response team is readily available per hospital policies and procedures. Continue HBOT as ordered. Yes Electronic Signature(s) Signed: 03/14/2019 4:44:35 PM By: Baltazar Najjar MD Previous Signature: 03/14/2019 3:39:48 PM Version By: [name] EMT/HBOT Entered By: Baltazar Najjar on 03/14/2019 16:14:02 -------------------------------------------------------------------------------- HBO Safety Checklist Details Patient Name: Date of Service: [place], [name] 03/14/2019 10:00 AM Medical Record Number:[id number] Patient Account Number: [id number] Date of Birth/Sex: Treating RN: 02/14/1986 (33 y.o. M) Primary Care Vidyuth Belsito: Katy Apo Other Clinician: [name] Referring Maryelizabeth Eberle: Treating Darcie Mellone/Extender:Robson, Almira Coaster, Chalmers Guest in Treatment: 8 HBO Safety Checklist Items Safety Checklist Consent Form Signed Patient voided / foley secured and emptied When did you last eato 0900 - cheese crackers Last dose of injectable or oral agent insulin NA Ostomy pouch emptied and vented if applicable All implantable devices assessed, documented and approved NA Intravenous access site secured and place Valuables secured Linens and cotton and cotton/polyester blend (less than 51% polyester) Personal oil-based products / skin lotions / body lotions removed NA Wigs or hairpieces removed NA Smoking or tobacco materials removed Books / newspapers / magazines / loose paper removed Cologne, aftershave, perfume and deodorant removed Jewelry removed (may wrap wedding band) NA Make-up removed Hair care products removed Battery operated devices (external) removed NA Heating patches and chemical warmers removed NA Titanium eyewear removed NA Nail polish cured greater than 10 hours NA Casting material cured greater than 10 hours NA Hearing aids removed NA Loose dentures or partials removed NA Prosthetics  have been removed Patient demonstrates correct use of air break device (if applicable) Patient concerns have been addressed Patient grounding bracelet on and cord attached to chamber Specifics for Inpatients (complete in addition to above) Medication sheet sent with patient Intravenous medications needed or due during therapy sent with patient Drainage tubes (e.g. nasogastric tube or chest tube secured and vented) Endotracheal  or Tracheotomy tube secured Cuff deflated of air and inflated with saline Airway suctioned Electronic Signature(s) Signed: 03/20/2019 3:47:19 PM By: [name] EMT/HBOT Previous Signature: 03/19/2019 11:07:17 AM Version By: [name] EMT/HBOT Previous Signature: 03/14/2019 10:59:25 AM Version By: [name] EMT/HBOT Entered By: [name] on 03/20/2019 11:18:29

## 2019-03-15 ENCOUNTER — Other Ambulatory Visit: Payer: Self-pay

## 2019-03-15 ENCOUNTER — Encounter (HOSPITAL_BASED_OUTPATIENT_CLINIC_OR_DEPARTMENT_OTHER): Payer: BC Managed Care – PPO | Admitting: Internal Medicine

## 2019-03-15 DIAGNOSIS — E11621 Type 2 diabetes mellitus with foot ulcer: Secondary | ICD-10-CM | POA: Diagnosis not present

## 2019-03-15 LAB — GLUCOSE, CAPILLARY
Glucose-Capillary: 290 mg/dL — ABNORMAL HIGH (ref 70–99)
Glucose-Capillary: 270 mg/dL — ABNORMAL HIGH (ref 70–99)

## 2019-03-15 NOTE — Progress Notes (Addendum)
Calarco, [name] ([id number]) Visit Report for 03/15/2019 HBO Details Patient Name: Date of Service: [name], [name] 03/15/2019 10:00 AM Medical Record Number:[id number] Patient Account Number: [id number] Date of Birth/Sex: Treating RN: March 27, 1986 (33 y.o. M) Primary Care Lyndell Allaire: Katy Apo Other Clinician: [name] Referring Qaadir Kent: Treating Eulla Kochanowski/Extender:Robson, Almira Coaster, Chalmers Guest in Treatment: 9 HBO Treatment Course Details Treatment Course Number: 1 Ordering Delma Drone: Baltazar Najjar Total Treatments Ordered: 40 HBO Treatment Start Date: 03/12/2019 HBO Indication: Diabetic Ulcer(s) of the Lower Extremity HBO Treatment Details Treatment Number: 3 Patient Type: Outpatient Chamber Type: Monoplace Chamber Serial #: [id number] Treatment Protocol: 2.0 ATA with 90 minutes oxygen, and no air breaks Treatment Details Compression Rate Down: 1.5 psi / minute De-Compression Rate Up: 2.0 psi / minute Air breaks and CompressTx Pressure breathing DecompressDecompress Begins Reached periods Begins Ends (leave unused spaces blank) Chamber Pressure (ATA)1 2 ------2 1 Clock Time (24 hr) 10:34 10:46 - - - - - - 12:16 12:24 Treatment Length: 110 (minutes) Treatment Segments: 4 Vital Signs Capillary Blood Glucose Reference Range: 80 - 120 mg / dl HBO Diabetic Blood Glucose Intervention Range: <131 mg/dl or >765 mg/dl Time Vitals Blood Respiratory Capillary Blood Glucose Pulse Action Type: Pulse: Temperature: Taken: Pressure: Rate: Glucose (mg/dl): Meter #: Oximetry (%) Taken: Pre 10:20 109/82 100 16 97.8 290 Post 12:27 153/93 85 16 98 270 Treatment Response Treatment Toleration: Well Treatment Completion Treatment Completed without Adverse Event Status: Additional Procedure Documentation Tissue Sevierity: Necrosis of bone Karlissa Aron Notes No concerns with treatment given Physician HBO Attestation: I certify that I supervised this HBO treatment  in accordance with Medicare guidelines. A trained Yes emergency response team is readily available per hospital policies and procedures. Continue HBOT as ordered. Yes Electronic Signature(s) Signed: 03/15/2019 6:34:01 PM By: Baltazar Najjar MD Previous Signature: 03/15/2019 3:59:32 PM Version By: [name] EMT/HBOT Entered By: Baltazar Najjar on 03/15/2019 18:31:14 -------------------------------------------------------------------------------- HBO Safety Checklist Details Patient Name: Date of Service: Obar, [name] 03/15/2019 10:00 AM Medical Record Number:[id number] Patient Account Number: [id number] Date of Birth/Sex: Treating RN: [birth year] (33 y.o. M) Primary Care Rin Gorton: Katy Apo Other Clinician: [name] Referring Paislei Dorval: Treating Zenith Lamphier/Extender:Robson, Almira Coaster, Chalmers Guest in Treatment: 9 HBO Safety Checklist Items Safety Checklist Consent Form Signed Patient voided / foley secured and emptied When did you last eato n/a Last dose of injectable or oral agent insulin NA Ostomy pouch emptied and vented if applicable All implantable devices assessed, documented and approved NA Intravenous access site secured and place Valuables secured Linens and cotton and cotton/polyester blend (less than 51% polyester) Personal oil-based products / skin lotions / body lotions removed NA Wigs or hairpieces removed NA Smoking or tobacco materials removed Books / newspapers / magazines / loose paper removed Cologne, aftershave, perfume and deodorant removed Jewelry removed (may wrap wedding band) NA Make-up removed Hair care products removed Battery operated devices (external) removed NA Heating patches and chemical warmers removed NA Titanium eyewear removed NA Nail polish cured greater than 10 hours NA Casting material cured greater than 10 hours NA Hearing aids removed NA Loose dentures or partials removed NA Prosthetics have been  removed Patient demonstrates correct use of air break device (if applicable) Patient concerns have been addressed Patient grounding bracelet on and cord attached to chamber Specifics for Inpatients (complete in addition to above) Medication sheet sent with patient Intravenous medications needed or due during therapy sent with patient Drainage tubes (e.g. nasogastric tube or chest tube secured and vented) Endotracheal or Tracheotomy tube  secured Cuff deflated of air and inflated with saline Airway suctioned Electronic Signature(s) Signed: 03/20/2019 3:47:19 PM By: [name] EMT/HBOT Previous Signature: 03/19/2019 11:07:17 AM Version By: [name] EMT/HBOT Previous Signature: 03/15/2019 10:47:41 AM Version By: [name] EMT/HBOT Entered By: [name] on 03/20/2019 11:18:48

## 2019-03-15 NOTE — Progress Notes (Signed)
Gramm, [name] ([id number]) Visit Report for 03/15/2019 Arrival Information Details Patient Name: Date of Service: [name], [name] 03/15/2019 10:00 AM Medical Record [id number] Patient Account Number: [id number] Date of Birth/Sex: Treating RN: 21-Oct-1985 (33 y.o. M) Primary Care Jamesrobert Ohanesian: [name] Other Clinician: [name] Referring Wilmoth Rasnic: Treating Takyra Cantrall/Extender:Robson, [name], [name]: 9 Visit Information History Since Last Visit Added or deleted any medications: No Patient Arrived: Ambulatory Any new allergies or adverse reactions: No Arrival Time: 10:15 Had a fall or experienced change in No Accompanied By: self activities of daily living that may affect Transfer Assistance: None risk of falls: Patient Identification Verified: Yes Signs or symptoms of abuse/neglect since last No Secondary Verification Process Yes visito Completed: Hospitalized since last visit: No Patient Requires Transmission-Based No Implantable device outside of the clinic excluding No Precautions: cellular tissue based products placed in the center Patient Has Alerts: No since last visit: Pain Present Now: No Electronic Signature(s) Signed: 03/15/2019 3:59:32 PM By: [name] EMT/HBOT Entered By: [name] on 03/15/2019 10:44:56 -------------------------------------------------------------------------------- Encounter Discharge Information Details Patient Name: Date of Service: [name], [name] 03/15/2019 10:00 AM Medical Record [id number] Patient Account Number: [id number] Date of Birth/Sex: Treating RN: Jun 20, 1985 (33 y.o. M) Primary Care Martiza Speth: [name] Other Clinician: [name] Referring Ruchi Stoney: Treating Latiya Navia/Extender:Robson, [name], [name]: 9 Encounter Discharge Information Items Discharge Condition: Stable Ambulatory Status: Ambulatory Discharge Destination:  Home Transportation: Private Auto Accompanied By: self Schedule Follow-up Appointment: Yes Clinical Summary of Care: Patient Declined Electronic Signature(s) Signed: 03/15/2019 3:59:32 PM By: [name] EMT/HBOT Entered By: [name] on 03/15/2019 13:12:25 -------------------------------------------------------------------------------- Patient/Caregiver Education Details Patient Name: [name], [name] 12/11/2020andnbsp10:00 Date of Service: AM Medical Record [id number] Number: Patient Account Number: [id number] Treating RN: April 21, 1985 (33 y.o. Date of Birth/Gender: M) Other Clinician: [name] Primary Care Treating [name], Turner Daniels Physician: Physician/Extender: Referring Physician: Smith Mince in Treatment: 9 Education Assessment Education Provided To: Patient Education Topics Provided Hyperbaric Oxygenation: Methods: Explain/Verbal Responses: State content correctly Electronic Signature(s) Signed: 03/15/2019 3:59:32 PM By: [name] EMT/HBOT Entered By: [name] on 03/15/2019 13:12:11 -------------------------------------------------------------------------------- Vitals Details Patient Name: Date of Service: [name], [name] 03/15/2019 10:00 AM Medical Record [id number] Patient Account Number: [id number] Date of Birth/Sex: Treating RN: [birth year] (33 y.o. M) Primary Care Gracin Mcpartland: [name] Other Clinician: [name] Referring Leeah Politano: Treating Anntionette Madkins/Extender:Robson, [name], [name]: 9 Vital Signs Time Taken: 10:20 Temperature (F): 97.8 Height (in): 77 Pulse (bpm): 100 Weight (lbs): 260 Respiratory Rate (breaths/min): 16 Body Mass Index (BMI): 30.8 Blood Pressure (mmHg): 109/82 Capillary Blood Glucose (mg/dl): 290 Reference Range: 80 - 120 mg / dl Electronic Signature(s) Signed: 03/15/2019 3:59:32 PM By: [name] EMT/HBOT Entered By: [name]  on 03/15/2019 10:45:13

## 2019-03-15 NOTE — Progress Notes (Signed)
[name], [name] ([id number]) Visit Report for 03/15/2019 SuperBill Details Patient Name: Date of Service: [name], [name] 03/15/2019 Medical Record [id number] Patient Account Number: [id number] Date of Birth/Sex: Treating RN: 1985-06-11 (33 y.o. 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

## 2019-03-18 ENCOUNTER — Encounter (HOSPITAL_BASED_OUTPATIENT_CLINIC_OR_DEPARTMENT_OTHER): Payer: BC Managed Care – PPO | Admitting: Internal Medicine

## 2019-03-18 ENCOUNTER — Other Ambulatory Visit: Payer: Self-pay

## 2019-03-18 DIAGNOSIS — E11621 Type 2 diabetes mellitus with foot ulcer: Secondary | ICD-10-CM | POA: Diagnosis not present

## 2019-03-18 LAB — GLUCOSE, CAPILLARY
Glucose-Capillary: 158 mg/dL — ABNORMAL HIGH (ref 70–99)
Glucose-Capillary: 151 mg/dL — ABNORMAL HIGH (ref 70–99)

## 2019-03-18 NOTE — Progress Notes (Addendum)
[name] [name] ([id number]) Visit Report for 03/18/2019 HBO Details Patient Name: Date of Service: [name] [name] 03/18/2019 10:00 AM Medical Record [id number] Patient Account Number: [id number] Date of Birth/Sex: Treating RN: 21-Dec-1985 (33 y.o. M) Primary Care Johnesha Acheampong: [name] Other Clinician: [name] Referring Brandun Pinn: Treating Valeri Sula/Extender:Robson, [name], [name]: 9 HBO Treatment Course Details Treatment Course Number: 1 Ordering Cannen Dupras: [name] Total Treatments Ordered: 40 HBO Treatment Start Date: 03/12/2019 HBO Indication: Diabetic Ulcer(s) of the Lower Extremity HBO Treatment Details Treatment Number: 4 Patient Type: Outpatient Chamber Type: Monoplace Chamber Serial #: [id number] Treatment Protocol: 2.0 ATA with 90 minutes oxygen, and no air breaks Treatment Details Compression Rate Down: 1.5 psi / minute De-Compression Rate Up: 2.0 psi / minute Air breaks and CompressTx Pressure breathing DecompressDecompress Begins Reached periods Begins Ends (leave unused spaces blank) Chamber Pressure (ATA)1 2 ------2 1 Clock Time (24 hr) 10:28 10:38 - - - - - - 12:08 12:16 Treatment Length: 108 (minutes) Treatment Segments: 4 Vital Signs Capillary Blood Glucose Reference Range: 80 - 120 mg / dl HBO Diabetic Blood Glucose Intervention Range: <131 mg/dl or >249 mg/dl Time Vitals Blood Respiratory Capillary Blood Glucose Pulse Action Type: Pulse: Temperature: Taken: Pressure: Rate: Glucose (mg/dl): Meter #: Oximetry (%) Taken: Pre 10:20 130/81 108 16 98 158 Post 12:20 140/84 73 14 97.8 151 Treatment Response Treatment Toleration: Well Treatment Completion Treatment Completed without Adverse Event Status: Additional Procedure Documentation Tissue Sevierity: Necrosis of bone Naomii Kreger Notes No concerns with treatment given Physician HBO Attestation: I certify that I supervised this HBO treatment  in accordance with Medicare guidelines. A trained Yes emergency response team is readily available per hospital policies and procedures. Continue HBOT as ordered. Yes Electronic Signature(s) Signed: 03/18/2019 5:56:50 PM By: [name] MD Previous Signature: 03/18/2019 3:44:56 PM Version By: [name] EMT/HBOT Entered By: [name] on 03/18/2019 17:53:16 -------------------------------------------------------------------------------- HBO Safety Checklist Details Patient Name: Date of Service: [name] [name] 03/18/2019 10:00 AM Medical Record [id number] Patient Account Number: [id number] Date of Birth/Sex: Treating RN: 06-18-1985 (33 y.o. M) Primary Care Tsutomu Barfoot: [name] Other Clinician: [name] Referring Renley Gutman: Treating Zuleika Gallus/Extender:Robson, [name], [name]: 9 HBO Safety Checklist Items Safety Checklist Consent Form Signed Patient voided / foley secured and emptied When did you last eato 0900 - pretzels Last dose of injectable or oral agent insulin NA Ostomy pouch emptied and vented if applicable All implantable devices assessed, documented and approved NA Intravenous access site secured and place Valuables secured Linens and cotton and cotton/polyester blend (less than 51% polyester) Personal oil-based products / skin lotions / body lotions removed NA Wigs or hairpieces removed NA Smoking or tobacco materials removed Books / newspapers / magazines / loose paper removed Cologne, aftershave, perfume and deodorant removed Jewelry removed (may wrap wedding band) NA Make-up removed Hair care products removed Battery operated devices (external) removed NA Heating patches and chemical warmers removed NA Titanium eyewear removed NA Nail polish cured greater than 10 hours NA Casting material cured greater than 10 hours NA Hearing aids removed NA Loose dentures or partials removed NA Prosthetics have  been removed Patient demonstrates correct use of air break device (if applicable) Patient concerns have been addressed Patient grounding bracelet on and cord attached to chamber Specifics for Inpatients (complete in addition to above) Medication sheet sent with patient Intravenous medications needed or due during therapy sent with patient Drainage tubes (e.g. nasogastric tube or chest tube secured and vented) Endotracheal or  Tracheotomy tube secured Cuff deflated of air and inflated with saline Airway suctioned Electronic Signature(s) Signed: 03/20/2019 3:47:19 PM By: [name] EMT/HBOT Previous Signature: 03/19/2019 11:07:17 AM Version By: [name] EMT/HBOT Previous Signature: 03/18/2019 10:59:55 AM Version By: [name] EMT/HBOT Entered By: [name] on 03/20/2019 11:19:08

## 2019-03-18 NOTE — Progress Notes (Signed)
Bergquist, [name] ([id number]) Visit Report for 03/18/2019 Arrival Information Details Patient Name: Date of Service: Roosevelt, [name] 03/18/2019 10:00 AM Medical Record [id number] Patient Account Number: [id number] Date of Birth/Sex: Treating RN: [birth year] (33 y.o. M) Primary Care Akesha Uresti: [name] Other Clinician: [name] Referring Chardai Gangemi: Treating Shloimy Michalski/Extender:Robson, [name], [name]: 9 Visit Information History Since Last Visit Added or deleted any medications: No Patient Arrived: Ambulatory Any new allergies or adverse reactions: No Arrival Time: 10:15 Had a fall or experienced change in No Accompanied By: self activities of daily living that may affect Transfer Assistance: None risk of falls: Patient Identification Verified: Yes Signs or symptoms of abuse/neglect since last No Secondary Verification Process Yes visito Completed: Hospitalized since last visit: No Patient Requires Transmission-Based No Implantable device outside of the clinic excluding No Precautions: cellular tissue based products placed in the center Patient Has Alerts: No since last visit: Pain Present Now: No Electronic Signature(s) Signed: 03/18/2019 3:44:56 PM By: [name] EMT/HBOT Entered By: [name] on 03/18/2019 10:58:58 -------------------------------------------------------------------------------- Encounter Discharge Information Details Patient Name: Date of Service: Kiser, [name] 03/18/2019 10:00 AM Medical Record [id number] Patient Account Number: [id number] Date of Birth/Sex: Treating RN: [birth year] (33 y.o. M) Primary Care Uzair Godley: [name] Other Clinician: [name] Referring Maksymilian Mabey: Treating Ann Bohne/Extender:Robson, [name], [name]: 9 Encounter Discharge Information Items Discharge Condition: Stable Ambulatory Status: Ambulatory Discharge Destination:  Home Transportation: Private Auto Accompanied By: self Schedule Follow-up Appointment: Yes Clinical Summary of Care: Patient Declined Electronic Signature(s) Signed: 03/18/2019 3:44:56 PM By: [name] EMT/HBOT Entered By: [name] on 03/18/2019 13:20:18 -------------------------------------------------------------------------------- Patient/Caregiver Education Details Patient Name: Dhaliwal, [name] 12/14/2020andnbsp10:00 Date of Service: AM Medical Record [id number] Number: Patient Account Number: [id number] Treating RN: 04/13/1985 (33 y.o. Date of Birth/Gender: M) Other Clinician: [name] Primary Care Treating [name], Turner Daniels Physician: Physician/Extender: Referring Physician: Smith Mince in Treatment: 9 Education Assessment Education Provided To: Patient Education Topics Provided Hyperbaric Oxygenation: Methods: Explain/Verbal Responses: State content correctly Electronic Signature(s) Signed: 03/18/2019 3:44:56 PM By: [name] EMT/HBOT Entered By: [name] on 03/18/2019 13:20:03 -------------------------------------------------------------------------------- Vitals Details Patient Name: Date of Service: Bayus, [name] 03/18/2019 10:00 AM Medical Record [id number] Patient Account Number: [id number] Date of Birth/Sex: Treating RN: Jan 06, 1986 (33 y.o. M) Primary Care Deyona Soza: [name] Other Clinician: [name] Referring Salsabeel Gorelick: Treating Oveta Idris/Extender:Robson, [name], [name]: 9 Vital Signs Time Taken: 10:20 Temperature (F): 98 Height (in): 77 Pulse (bpm): 108 Weight (lbs): 260 Respiratory Rate (breaths/min): 16 Body Mass Index (BMI): 30.8 Blood Pressure (mmHg): 130/81 Capillary Blood Glucose (mg/dl): 158 Reference Range: 80 - 120 mg / dl Electronic Signature(s) Signed: 03/18/2019 3:44:56 PM By: [name] EMT/HBOT Entered By: [name]  on 03/18/2019 10:59:15

## 2019-03-18 NOTE — Progress Notes (Signed)
[name], Max ([id number]) Visit Report for 03/18/2019 SuperBill Details Patient Name: Date of Service: [name], Max 03/18/2019 Medical Record [id number] Patient Account Number: [id number] Date of Birth/Sex: Treating RN: Aug 16, 1985 (33 y.o. 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

## 2019-03-19 ENCOUNTER — Encounter (HOSPITAL_BASED_OUTPATIENT_CLINIC_OR_DEPARTMENT_OTHER): Payer: BC Managed Care – PPO | Admitting: Internal Medicine

## 2019-03-19 ENCOUNTER — Other Ambulatory Visit: Payer: Self-pay

## 2019-03-19 DIAGNOSIS — E11621 Type 2 diabetes mellitus with foot ulcer: Secondary | ICD-10-CM | POA: Diagnosis not present

## 2019-03-20 ENCOUNTER — Encounter (HOSPITAL_BASED_OUTPATIENT_CLINIC_OR_DEPARTMENT_OTHER): Payer: BC Managed Care – PPO | Admitting: Physician Assistant

## 2019-03-20 ENCOUNTER — Other Ambulatory Visit: Payer: Self-pay

## 2019-03-20 DIAGNOSIS — E11621 Type 2 diabetes mellitus with foot ulcer: Secondary | ICD-10-CM | POA: Diagnosis not present

## 2019-03-20 LAB — GLUCOSE, CAPILLARY
Glucose-Capillary: 258 mg/dL — ABNORMAL HIGH (ref 70–99)
Glucose-Capillary: 224 mg/dL — ABNORMAL HIGH (ref 70–99)

## 2019-03-20 NOTE — Progress Notes (Signed)
[name], Max ([id number]) Visit Report for 03/20/2019 Problem List Details Patient Name: Date of Service: [name], Max 03/20/2019 10:00 AM Medical Record [id number] Patient Account Number: [id number] Date of Birth/Sex: Treating RN: [birth year] (33 y.o. 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

## 2019-03-20 NOTE — Progress Notes (Signed)
[name] [name] ([id number]) Visit Report for 03/19/2019 HPI Details Patient Name: Date of Service: [name] [name] 03/19/2019 4:00 PM Medical Record [id number] Patient Account Number: [id number] Date of Birth/Sex: Treating RN: December 31, 1985 (33 y.o. 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 He comes in for review of a wound over the plantar fifth metatarsal head extending into the lateral part of the foot. He was followed for this previously by his podiatrist Dr. Cornelius Moras. As the patient tells his story he went to see podiatry first for a swelling he developed on the lateral part of his fifth metatarsal head in May. He states this was "open" by podiatry and the area closed. He was followed up in June and it was again opened callus removed and it closed promptly. There were plans being made for surgery on the fifth metatarsal head in June however his blood sugar was apparently too high for anesthesia. Apparently the area was debrided and opened again in June and it is never closed since. Looking over the records from podiatry I am really not able to follow this. It was clear when he was first seen it was before 5/14 at that point he already had a wound. By 5/17 the ulcer was resolved. I do not see anything about a procedure. On 5/28 noted to have pre-ulcerative moderate keratosis. X-ray noted 1/5 contracted toe and tailor's bunion and metatarsal deformity. On a visit date on 09/28/2018 the dorsal part of the left foot it healed and resolved. There was concern about swelling in his lower extremity he was sent to the ER.. As far as I can tell he was seen in the ER on 7/12 with an ulcer on his left foot. A DVT rule out of the  left leg was negative. I do not think I have complete records from podiatry but I am not able to verify the procedures this patient states he had. He states after the last procedure the wound has never closed although I am not able to follow this in the records I have from podiatry. He has not had a recent x-ray The patient has been using Neosporin on the wound. He is wearing a Darco shoe. He is still very active up on his foot working and exercising. Past medical history; type 2 diabetes ketosis-prone, leg swelling with a negative DVT study in July. Non-smoker ABI in our clinic was 0.85 on the left 10/16; substantial wound on the plantar left fifth met head extending laterally almost to the dorsal fifth MTP. We have been using silver alginate we gave him a Darco forefoot off loader. An x-ray did not show evidence of osteomyelitis did note soft tissue emphysema which I think was due to gas tracking through an open wound. There is no doubt in my mind he requires an MRI 10/23; MRI not booked until 3 November at the earliest this is largely due to his glucose sensor in the right arm. We have been using silver alginate. There has been an improvement 10/29; I am still not exactly sure when his MRI is booked for. He says it is the third but it is the 10th in epic. This definitely needs to be done. He is running a low-grade fever today but no other symptoms. No real improvement in the 1 02/26/2019 patient  presents today for a follow-up visit here in our clinic he is last been seen in the clinic on October 29. Subsequently we were working on getting MRI to evaluate and see what exactly was going on and where we would need to go from the standpoint of whether or not he had osteomyelitis and again what treatments were going be required. Subsequently the patient ended up being admitted to the hospital on 02/07/2019 and was discharged on 02/14/2019. This is a somewhat interesting admission with a discharge  diagnosis of pneumonia due to COVID-19 although he was positive for COVID-19 when tested at the urgent care but negative x2 when he was actually in the hospital. With that being said he did have acute respiratory failure with hypoxia and it was noted he also have a left foot ulceration with osteomyelitis. With that being said he did require oxygen for his pneumonia and I level 4 L. He was placed on antivirals and steroids for the COVID-19. He was also transferred to the [place] at one point. Nonetheless he did subsequently discharged home and since being home has done much better in that regard. The CT angiogram did not show any pulmonary embolism. With regard to the osteomyelitis the patient was placed on vancomycin and Zosyn while in the hospital but has been changed to Augmentin at discharge. It was also recommended that he follow-up with wound care and podiatry. Podiatry however wanted him to see [name] according to the patient prior to them doing anything further. His hemoglobin A1c was 9.9 as noted in the hospital. Have an MRI of the left foot performed while in the hospital on 02/04/2019. 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 There is an overlying plantar open wound noted an abscess tracking back along the lateral aspect of the fifth metatarsal shaft. There is otherwise diffuse cellulitis and mild fasciitis without findings of polymyositis. The patient did have recently pneumonia secondary to COVID-19 I looked in the chart through epic and it does appear that the patient may need to have an additional x-ray just to ensure everything is cleared and that he has no airspace disease prior to putting him into the chamber. 03/05/2019; patient was readmitted to the clinic last week. He was hospitalized twice for a viral upper respiratory tract infection from 11/1 through 11/4 and then 11/5  through 11/12 ultimately this turned out to be Covid pneumonitis. Although he was discharged on oxygen he is not using it. He says he feels fine. He has no exercise limitation no cough no sputum. His O2 sat in our clinic today was 100% on room air. 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 He received Vanco and Zosyn in the hospital and then was discharged on 2 weeks of Augmentin. I do not see any relevant cultures. He was supposed to follow-up with infectious disease but I do not see that he has an appointment. 12/8; patient saw Dr. Novella Olive of infectious disease last week. He felt that he had had adequate antibiotic therapy. He did not go to follow-up with Dr. Amalia Hailey of podiatry and I have again talked to him about the pros and cons of this. He does not want to consider a ray amputation of this time. He is aware of the risks of recurrence, migration etc. He started HBO  today and tolerated this well. He can complete the Augmentin that I gave him last week. I have looked over the lab work that Dr. Chana Bode ordered his C-reactive protein was 3.3 and his sedimentation rate was 17. The C-reactive protein is never really been measurably that high in this patient 12/15; not much change in the wound today however he has undermining along the lateral part of the foot again more extensively than last week. He has some rims of epithelialization. We have been using silver alginate. He is undergoing hyperbarics but did not dive today Electronic Signature(s) Signed: 03/19/2019 5:53:46 PM By: [name] MD Entered By: [name] on 03/19/2019 17:46:49 -------------------------------------------------------------------------------- Physical Exam Details Patient Name: Date of Service: [name] [name] 03/19/2019 4:00 PM Medical Record [id number] Patient Account Number:  [id number] Date of Birth/Sex: Treating RN: 1985/04/24 (33 y.o. M) Primary Care Provider: [name] Other Clinician: Referring Provider: Treating Provider/Extender:Albert Devaul, [name], [name]: 9 Constitutional Patient is hypertensive.. Pulse regular and within target range for patient.Marland Kitchen Respirations regular, non-labored and within target range.. Temperature is normal and within the target range for the patient.Marland Kitchen Appears in no distress. Notes Wound exam; the patient's wound is on the left fifth plantar metatarsal head. There is undermining superiorly I do not think this is any better I was hopeful last week. This area may need to be unroofed but I did not do this today. No evidence of surrounding infection Electronic Signature(s) Signed: 03/19/2019 5:53:46 PM By: [name] MD Entered By: [name] on 03/19/2019 17:47:48 -------------------------------------------------------------------------------- Physician Orders Details Patient Name: Date of Service: [name] [name] 03/19/2019 4:00 PM Medical Record [id number] Patient Account Number: [id number] Date of Birth/Sex: Treating RN: 1985-10-12 (33 y.o. 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 Skin Barriers/Peri-Wound  Care Moisturizing lotion - to both legs Wound Cleansing Wound #1 Left Metatarsal head fifth May shower with protection. Primary Wound Dressing Wound #1 Left Metatarsal head fifth Calcium Alginate with Silver - wound bed Iodoflex - packed into undermining Secondary Dressing Wound #1 Left Metatarsal head fifth Dry Gauze Off-Loading Total Contact Cast to Left Lower Extremity Hyperbaric Oxygen Therapy Wound #1 Left Metatarsal head fifth Evaluate for HBO Therapy Indication: - osteomylitis left foot If appropriate for treatment, begin HBOT per protocol: 2.0 ATA for 90 Minutes without Air Breaks 2.5 ATA for 90 Minutes with 2 Five (5) Minute Air Breaks Total Number of Treatments: - 40 One treatments per day (delivered Monday through Friday unless otherwise specified in Special Instructions below): Finger stick Blood Glucose Pre- and Post- HBOT Treatment. Follow Hyperbaric Oxygen Glycemia Protocol GLYCEMIA INTERVENTIONS PROTOCOL PRE-HBO GLYCEMIA INTERVENTIONS ACTION INTERVENTION Obtain pre-HBO capillary blood 1 glucose (ensure physician order is in chart). A. Notify HBO physician and await physician orders. 2 If result is 70 mg/dl or below: B. If the result meets the hospital definition of a critical result, follow hospital policy. A. Give patient an 8 ounce Glucerna Shake, an 8 ounce  Ensure, or 8 ounces of a Glucerna/Ensure equivalent dietary supplement*. B. Wait 30 minutes. C. Retest patients capillary blood If result is 71 mg/dl to 130 mg/dl: glucose (CBG). D. If result greater than or equal to 110 mg/dl, proceed with HBO. If result less than 110 mg/dl, notify HBO physician and consider holding HBO. If result is 131 mg/dl to 249 mg/dl: A. Proceed with HBO. A. Notify HBO physician and await physician orders. B. It is recommended to hold HBO If result is 250 mg/dl or greater: and do blood/urine ketone testing. C. If the result meets the hospital definition of a  critical result, follow hospital policy. POST-HBO GLYCEMIA INTERVENTIONS ACTION INTERVENTION Obtain post HBO capillary blood 1 glucose (ensure physician order is in chart). A. Notify HBO physician and await physician orders. 2 If result is 70 mg/dl or below: B. If the result meets the hospital definition of a critical result, follow hospital policy. A. Give patient an 8 ounce Glucerna Shake, an 8 ounce Ensure, or 8 ounces of a Glucerna/Ensure equivalent dietary supplement*. B. Wait 15 minutes for symptoms of hypoglycemia (i.e. nervousness, anxiety, sweating, chills, If result is 71 mg/dl to 100 mg/dl: clamminess, irritability, confusion, tachycardia or dizziness). C. If patient asymptomatic, discharge patient. If patient symptomatic, repeat capillary blood glucose (CBG) and notify HBO physician. If result is 101 mg/dl to 249 mg/dl: A. Discharge patient. A. Notify HBO physician and await physician orders. B. It is recommended to do If result is 250 mg/dl or greater: blood/urine ketone testing. C. If the result meets the hospital definition of a critical result, follow hospital policy. *Juice or candies are NOT equivalent products. If patient refuses the Glucerna or Ensure, please consult the hospital dietitian for an appropriate substitute. Electronic Signature(s) Signed: 03/19/2019 5:53:46 PM By: [name] MD Signed: 03/20/2019 9:49:12 AM By: Carlene Coria RN Entered By: Carlene Coria on 03/19/2019 17:33:12 -------------------------------------------------------------------------------- Problem List Details Patient Name: Date of Service: [name] [name] 03/19/2019 4:00 PM Medical Record [id number] Patient Account Number: [id number] Date of Birth/Sex: Treating RN: [birth year] (33 y.o. 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 He comes in for review of a wound over the plantar fifth metatarsal head extending into the lateral part of the foot. He was followed for this previously by his podiatrist Dr. Cornelius Moras. As the patient tells his story he went to see podiatry first for a swelling he developed on the lateral part of his fifth metatarsal head in May.  He states this was "open" by podiatry and the area closed. He was followed up in June and it was again opened callus removed and it closed promptly. There were plans being made for surgery on the fifth metatarsal head in June however his blood sugar was apparently too high for anesthesia. Apparently the area was debrided and opened again in June  and it is never closed since. Looking over the records from podiatry I am really not able to follow this. It was clear when he was first seen it was before 5/14 at that point he already had a wound. By 5/17 the ulcer was resolved. I do not see anything about a procedure. On 5/28 noted to have pre-ulcerative moderate keratosis. X-ray noted 1/5 contracted toe and tailor's bunion and metatarsal deformity. On a visit date on 09/28/2018 the dorsal part of the left foot it healed and resolved. There was concern about swelling in his lower extremity he was sent to the ER.. As far as I can tell he was seen in the ER on 7/12 with an ulcer on his left foot. A DVT rule out of the left leg was negative. I do not think I have complete records from podiatry but I am not able to verify the procedures this patient states he had. He states after the last procedure the wound has never closed although I am not able to follow this in the records I have from podiatry. He has not had a recent x-ray The patient has been using Neosporin on the wound. He is wearing a Darco shoe. He is still very active up on his foot working and exercising. Past medical history; type 2 diabetes ketosis-prone, leg swelling with a negative DVT study in July. Non-smoker ABI in our clinic was 0.85 on the left 10/16; substantial wound on the plantar left fifth met head extending laterally almost to the dorsal fifth MTP. We have been using silver alginate we gave him a Darco forefoot off loader. An x-ray did not show evidence of osteomyelitis did note soft tissue emphysema which I think was due to gas tracking through an open wound. There is no doubt in my mind he requires an MRI 10/23; MRI not booked until 3 November at the earliest this is largely due to his glucose sensor in the right arm. We have been using silver alginate. There has been an improvement 10/29; I am still not exactly sure when his MRI is booked for. He says it is the third  but it is the 10th in epic. This definitely needs to be done. He is running a low-grade fever today but no other symptoms. No real improvement in the 1 02/26/2019 patient presents today for a follow-up visit here in our clinic he is last been seen in the clinic on October 29. Subsequently we were working on getting MRI to evaluate and see what exactly was going on and where we would need to go from the standpoint of whether or not he had osteomyelitis and again what treatments were going be required. Subsequently the patient ended up being admitted to the hospital on 02/07/2019 and was discharged on 02/14/2019. This is a somewhat interesting admission with a discharge diagnosis of pneumonia due to COVID-19 although he was positive for COVID-19 when tested at the urgent care but negative x2 when he was actually in the hospital. With that being said he did have acute respiratory failure with hypoxia and it was noted he also have a left foot  ulceration with osteomyelitis. With that being said he did require oxygen for his pneumonia and I level 4 L. He was placed on antivirals and steroids for the COVID-19. He was also transferred to the [place] at one point. Nonetheless he did subsequently discharged home and since being home has done much better in that regard. The CT angiogram did not show any pulmonary embolism. With regard to the osteomyelitis the patient was placed on vancomycin and Zosyn while in the hospital but has been changed to Augmentin at discharge. It was also recommended that he follow-up with wound care and podiatry. Podiatry however wanted him to see [name] according to the patient prior to them doing anything further. His hemoglobin A1c was 9.9 as noted in the hospital. Have an MRI of the left foot performed while in the hospital on 02/04/2019. 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 There is an overlying plantar open wound noted an abscess tracking back along the lateral aspect of the fifth metatarsal shaft. There is otherwise diffuse cellulitis and mild fasciitis without findings of polymyositis. The patient did have recently pneumonia secondary to COVID-19 I looked in the chart through epic and it does appear that the patient may need to have an additional x-ray just to ensure everything is cleared and that he has no airspace disease prior to putting him into the chamber. 03/05/2019; patient was readmitted to the clinic last week. He was hospitalized twice for a viral upper respiratory tract infection from 11/1 through 11/4 and then 11/5 through 11/12 ultimately this turned out to be Covid pneumonitis. Although he was discharged on oxygen he is not using it. He says he feels fine. He has no exercise limitation no cough no sputum. His O2 sat in our clinic today was 100% on room air. 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 He received Vanco and Zosyn in the hospital and then was discharged on 2 weeks of Augmentin. I do not see any relevant cultures. He was supposed to follow-up with infectious disease but I do not see that he has an appointment. 12/8; patient saw Dr. Novella Olive of infectious disease last week. He felt that he had had adequate antibiotic therapy. He did not go to follow-up with Dr. Amalia Hailey of podiatry and I have again talked to him about the pros and cons of this. He does not want to consider a ray amputation of this time. He is aware of the risks of recurrence, migration etc. He started HBO today and tolerated this well. He can complete the Augmentin that I gave him last week. I have looked over the lab work that Dr. Chana Bode ordered his C-reactive protein was 3.3 and his sedimentation rate was 17. The C-reactive protein is never really been  measurably that high in this patient 12/15; not much change in the wound today however he has undermining along the lateral part of the foot again more extensively than last week. He has some rims of epithelialization. We have been using silver alginate. He is undergoing hyperbarics but did not dive today Objective Constitutional Patient is hypertensive.. Pulse regular and within target range for patient.Marland Kitchen Respirations regular, non-labored and within target range.. Temperature is normal and within the target range for the patient.Marland Kitchen Appears in no distress. Vitals Time Taken: 5:08 PM, Height:  77 in, Weight: 260 lbs, BMI: 30.8, Temperature: 98.5 F, Pulse: 97 bpm, Respiratory Rate: 17 breaths/min, Blood Pressure: 144/78 mmHg, Capillary Blood Glucose: 150 mg/dl. General Notes: patient stated last CBG was 150 General Notes: Wound exam; the patient's wound is on the left fifth plantar metatarsal head. There is undermining superiorly I do not think this is any better I was hopeful last week. This area may need to be unroofed but I did not do this today. No evidence of surrounding infection Integumentary (Hair, Skin) Wound #1 status is Open. Original cause of wound was Trauma. The wound is located on the Left Metatarsal head fifth. The wound measures 2.1cm length x 2.5cm width x 0.4cm depth; 4.123cm^2 area and 1.649cm^3 volume. There is Fat Layer (Subcutaneous Tissue) Exposed exposed. There is no tunneling noted, however, there is undermining starting at 1:00 and ending at 11:00 with a maximum distance of 2.4cm. There is a medium amount of serosanguineous drainage noted. The wound margin is well defined and not attached to the wound base. There is large (67-100%) pink granulation within the wound bed. There is no necrotic tissue within the wound bed. 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 There was a Total Contact Cast Procedure by [name], MD. Post procedure Diagnosis Wound #1: Same as Pre-Procedure Plan Follow-up Appointments: Other: - thursday or friday Dressing Change Frequency: Wound #1 Left Metatarsal head fifth: Do not change entire dressing for one week. Skin Barriers/Peri-Wound Care: Moisturizing lotion - to both legs Wound Cleansing: Wound #1 Left Metatarsal head fifth: May shower with protection. Primary Wound Dressing: Wound #1 Left Metatarsal head fifth: Calcium Alginate with Silver - wound bed Iodoflex - packed into undermining Secondary Dressing: Wound #1 Left Metatarsal head fifth: Dry Gauze Off-Loading: Total Contact Cast to Left Lower Extremity Hyperbaric Oxygen Therapy: Wound #1 Left Metatarsal head fifth: Evaluate for HBO Therapy Indication: - osteomylitis left foot If appropriate for treatment, begin HBOT per protocol: 2.0 ATA for 90 Minutes without Air Breaks 2.5 ATA for 90 Minutes with 2 Five (5) Minute Air Breaks Total Number of Treatments: - 40 One treatments per day (delivered Monday through Friday unless otherwise specified in Special Instructions below): Finger stick Blood Glucose Pre- and Post- HBOT Treatment. Follow Hyperbaric Oxygen Glycemia Protocol 1. Left fifth metatarsal head. Undermining superiorly and anteriorly back to the way it was. I used Iodosorb in this area. Silver alginate over the rest of the wound and put him in a total contact cast 2. He has completed antibiotics and is undergoing hyperbarics. I see no current evidence of active infection that would preclude a cast at this point. Electronic Signature(s) Signed:  03/19/2019 5:53:46 PM By: [name] MD Entered By: [name] on 03/19/2019 17:50:46 -------------------------------------------------------------------------------- Total Contact Cast Details Patient Name: Date of Service: [name] [name] 03/19/2019 4:00 PM Medical Record [id number] Patient Account Number: [id number] Date of Birth/Sex: May 07, 1985 (33 y.o. M) Treating RN: Primary Care Provider: [name] Other Clinician: Referring Provider: Treating Provider/Extender:Geeta Dworkin, [name], [name]: 9 Total Contact Cast Applied for Wound Assessment: Wound #1 Left Metatarsal head fifth Performed By: Physician [name], MD  Post Procedure Diagnosis Same as Pre-procedure Electronic Signature(s) Signed: 03/19/2019 5:53:46 PM By: [name] MD Entered By: [name] on 03/19/2019 17:45:56 -------------------------------------------------------------------------------- SuperBill Details Patient Name: Date of Service: [name] [name] 03/19/2019 Medical Record [id number] Patient Account Number: [id number] Date of Birth/Sex: Treating RN: Jan 04, 1986 (33 y.o. 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

## 2019-03-20 NOTE — Progress Notes (Addendum)
Blase, [name] ([id number]) Visit Report for 03/20/2019 HBO Details Patient Name: Date of Service: [name], [name] 03/20/2019 10:00 AM Medical Record Number:[id number] Patient Account Number: [id number] Date of Birth/Sex: Treating RN: 1986-02-14 (33 y.o. M) Primary Care Inita Uram: Katy Apo Other Clinician: [name] Referring Neita Landrigan: Treating Rollo Farquhar/Extender:Stone III, Aleda Grana, Chalmers Guest in Treatment: 9 HBO Treatment Course Details Treatment Course Number: 1 Ordering Hitomi Slape: Baltazar Najjar Total Treatments Ordered: 40 HBO Treatment Start Date: 03/12/2019 HBO Indication: Diabetic Ulcer(s) of the Lower Extremity HBO Treatment Details Treatment Number: 5 Patient Type: Outpatient Chamber Type: Monoplace Chamber Serial #: [id number] Treatment Protocol: 2.0 ATA with 90 minutes oxygen, and no air breaks Treatment Details Compression Rate Down: 1.5 psi / minute De-Compression Rate Up: 2.0 psi / minute Air breaks and CompressTx Pressure breathing DecompressDecompress Begins Reached periods Begins Ends (leave unused spaces blank) Chamber Pressure (ATA)1 2 ------2 1 Clock Time (24 hr) 10:32 10:42 - - - - - - 12:12 12:20 Treatment Length: 108 (minutes) Treatment Segments: 4 Vital Signs Capillary Blood Glucose Reference Range: 80 - 120 mg / dl HBO Diabetic Blood Glucose Intervention Range: <131 mg/dl or >854 mg/dl Time Vitals Blood Respiratory Capillary Blood Glucose Pulse Action Type: Pulse: Temperature: Taken: Pressure: Rate: Glucose (mg/dl): Meter #: Oximetry (%) Taken: Pre 10:20 159/78 100 17 97.9 258 Post 12:22 156/93 84 14 97.8 224 Treatment Response Treatment Toleration: Well Treatment Completion Treatment Completed without Adverse Event Status: Additional Procedure Documentation Tissue Sevierity: Necrosis of bone Physician HBO Attestation: I certify that I supervised this HBO treatment in accordance with Medicare guidelines. A trained  Yes emergency response team is readily available per hospital policies and procedures. Continue HBOT as ordered. Yes Electronic Signature(s) Signed: 03/20/2019 5:35:13 PM By: Lenda Kelp PA-C Previous Signature: 03/20/2019 3:47:19 PM Version By: [name] EMT/HBOT Entered By: Lenda Kelp on 03/20/2019 17:35:13 -------------------------------------------------------------------------------- HBO Safety Checklist Details Patient Name: Date of Service: [name], [name] 03/20/2019 10:00 AM Medical Record Number:[id number] Patient Account Number: [id number] Date of Birth/Sex: Treating RN: [birth year] (33 y.o. M) Primary Care Alexzia Kasler: Katy Apo Other Clinician: [name] Referring Analisia Kingsford: Treating Lemont Sitzmann/Extender:Stone III, Aleda Grana, Deirdre Peer Weeks in Treatment: 9 HBO Safety Checklist Items Safety Checklist Consent Form Signed Patient voided / foley secured and emptied When did you last eato n/a Last dose of injectable or oral agent insulin pump NA Ostomy pouch emptied and vented if applicable All implantable devices assessed, documented and approved NA Intravenous access site secured and place Valuables secured Linens and cotton and cotton/polyester blend (less than 51% polyester) Personal oil-based products / skin lotions / body lotions removed NA Wigs or hairpieces removed NA Smoking or tobacco materials removed Books / newspapers / magazines / loose paper removed Cologne, aftershave, perfume and deodorant removed Jewelry removed (may wrap wedding band) NA Make-up removed Hair care products removed Battery operated devices (external) removed NA Heating patches and chemical warmers removed NA Titanium eyewear removed NA Nail polish cured greater than 10 hours Casting material cured greater than 10 hours NA Hearing aids removed NA Loose dentures or partials removed NA Prosthetics have been removed Patient demonstrates correct use of air break  device (if applicable) Patient concerns have been addressed Patient grounding bracelet on and cord attached to chamber Specifics for Inpatients (complete in addition to above) Medication sheet sent with patient Intravenous medications needed or due during therapy sent with patient Drainage tubes (e.g. nasogastric tube or chest tube secured and vented) Endotracheal or Tracheotomy tube secured Cuff deflated  of air and inflated with saline Airway suctioned Electronic Signature(s) Signed: 03/20/2019 11:17:48 AM By: [name] EMT/HBOT Previous Signature: 03/20/2019 11:15:12 AM Version By: [name] EMT/HBOT Entered By: [name] on 03/20/2019 11:17:47

## 2019-03-20 NOTE — Progress Notes (Signed)
Vining, [name] ([id number]) Visit Report for 03/20/2019 Arrival Information Details Patient Name: Date of Service: [place], [name] 03/20/2019 10:00 AM Medical Record [id number] Patient Account Number: [id number] Date of Birth/Sex: Treating RN: Oct 21, 1985 (33 y.o. M) Primary Care Yissel Habermehl: [name] Other Clinician: [name] Referring Geselle Cardosa: Treating Tomasz Steeves/Extender:Stone III, Newell Coral, [name]: 9 Visit Information History Since Last Visit Added or deleted any medications: No Patient Arrived: Ambulatory Any new allergies or adverse reactions: No Arrival Time: 10:15 Had a fall or experienced change in No Accompanied By: self activities of daily living that may affect Transfer Assistance: None risk of falls: Patient Identification Verified: Yes Signs or symptoms of abuse/neglect since last No Secondary Verification Process Yes visito Completed: Hospitalized since last visit: No Patient Requires Transmission-Based No Implantable device outside of the clinic excluding No Precautions: cellular tissue based products placed in the center Patient Has Alerts: No since last visit: Pain Present Now: No Electronic Signature(s) Signed: 03/20/2019 3:47:19 PM By: [name] EMT/HBOT Entered By: [name] on 03/20/2019 11:14:14 -------------------------------------------------------------------------------- Encounter Discharge Information Details Patient Name: Date of Service: [place], [name] 03/20/2019 10:00 AM Medical Record Number:[id number] Patient Account Number: [id number] Date of Birth/Sex: Treating RN: 11-18-1985 (33 y.o. M) Primary Care Sophiea Ueda: [name] Other Clinician: [name] Referring Antonya Leeder: Treating Caleah Tortorelli/Extender:Stone III, Newell Coral, [name]: 9 Encounter Discharge Information Items Discharge Condition: Stable Ambulatory Status: Ambulatory Discharge Destination:  Home Transportation: Private Auto Accompanied By: self Schedule Follow-up Appointment: Yes Clinical Summary of Care: Patient Declined Electronic Signature(s) Signed: 03/20/2019 3:47:19 PM By: [name] EMT/HBOT Entered By: [name] on 03/20/2019 13:35:40 -------------------------------------------------------------------------------- Patient/Caregiver Education Details Patient Name: Rozak, [name] 12/16/2020andnbsp10:00 Date of Service: AM Medical Record [id number] Number: Patient Account Number: [id number] Treating RN: 04/20/1985 (33 y.o. Date of Birth/Gender: M) Other Clinician: [name] Primary Care Treating [name], Floyce Stakes Physician: Physician/Extender: Referring Physician: Smith Mince in Treatment: 9 Education Assessment Education Provided To: Patient Education Topics Provided Hyperbaric Oxygenation: Methods: Explain/Verbal Responses: State content correctly Electronic Signature(s) Signed: 03/20/2019 3:47:19 PM By: [name] EMT/HBOT Entered By: [name] on 03/20/2019 13:35:25 -------------------------------------------------------------------------------- Vitals Details Patient Name: Date of Service: [place]acey, [name] 03/20/2019 10:00 AM Medical Record [id number] Patient Account Number: [id number] Date of Birth/Sex: Treating RN: Mar 13, 1986 (33 y.o. M) Primary Care Dayvian Blixt: [name] Other Clinician: Referring Vada Swift: Treating Saidah Kempton/Extender:Stone III, Newell Coral, Ashby Dawes Weeks in Treatment: 9 Vital Signs Time Taken: 10:20 Temperature (F): 97.9 Height (in): 77 Pulse (bpm): 100 Weight (lbs): 260 Respiratory Rate (breaths/min): 17 Body Mass Index (BMI): 30.8 Blood Pressure (mmHg): 159/78 Capillary Blood Glucose (mg/dl): 258 Reference Range: 80 - 120 mg / dl Electronic Signature(s) Signed: 03/20/2019 3:47:19 PM By: [name] EMT/HBOT Entered By: [name] on 03/20/2019  11:14:31

## 2019-03-21 ENCOUNTER — Encounter (HOSPITAL_BASED_OUTPATIENT_CLINIC_OR_DEPARTMENT_OTHER): Payer: BC Managed Care – PPO | Admitting: Internal Medicine

## 2019-03-21 NOTE — Progress Notes (Signed)
Radman, [name] ([id number]) Visit Report for 03/19/2019 Arrival Information Details Patient Name: Date of Service: Sugg, [name] 03/19/2019 4:00 PM Medical Record [id number] Patient Account Number: [id number] Date of Birth/Sex: Treating RN: 1985-12-09 (33 y.o. Jerilynn Mages) Carlene Coria Primary Care Laurene Melendrez: [name] Other Clinician: Referring Fletcher [name]: Treating Jyoti Harju/Extender:Robson, [name], [name]: 9 Visit Information History Since Last Visit All ordered tests and consults were completed: No Patient Arrived: Ambulatory Added or deleted any medications: No Arrival Time: 17:08 Any new allergies or adverse reactions: No Accompanied By: self Had a fall or experienced change in No Transfer Assistance: None activities of daily living that may affect Patient Identification Verified: Yes risk of falls: Secondary Verification Process Completed: Yes Signs or symptoms of abuse/neglect since last No Patient Requires Transmission-Based No visito Precautions: Hospitalized since last visit: No Patient Has Alerts: No Implantable device outside of the clinic excluding No cellular tissue based products placed in the center since last visit: Has Dressing in Place as Prescribed: Yes Has Compression in Place as Prescribed: Yes Pain Present Now: No Electronic Signature(s) Signed: 03/20/2019 9:49:12 AM By: Carlene Coria RN Entered By: Carlene Coria on 03/19/2019 17:08:40 -------------------------------------------------------------------------------- Encounter Discharge Information Details Patient Name: Date of Service: Lamm, [name] 03/19/2019 4:00 PM Medical Record Number:[id number] Patient Account Number: [id number] Date of Birth/Sex: Treating RN: 1985-07-08 (33 y.o. Marvis Repress Primary Care Alyxis Grippi: [name] Other Clinician: Referring Sol Odor: Treating Demetris Meinhardt/Extender:Robson, [name], [name]:  9 Encounter Discharge Information Items Discharge Condition: Stable Ambulatory Status: Ambulatory Discharge Destination: Home Transportation: Private Auto Accompanied By: self Schedule Follow-up Appointment: Yes Clinical Summary of Care: Patient Declined Electronic Signature(s) Signed: 03/21/2019 5:33:40 PM By: Kela Millin Entered By: Kela Millin on 03/19/2019 17:57:48 -------------------------------------------------------------------------------- Lower Extremity Assessment Details Patient Name: Date of Service: Palmieri, [name] 03/19/2019 4:00 PM Medical Record [id number] Patient Account Number: [id number] Date of Birth/Sex: Treating RN: Oct 27, 1985 (33 y.o. Oval Linsey Primary Care Argelio Granier: [name] Other Clinician: Referring Zi Newbury: Treating Kolt Mcwhirter/Extender:Robson, [name], Ashby Dawes Weeks in Treatment: 9 Edema Assessment Assessed: [Left: No] [Right: No] Edema: [Left: Ye] [Right: s] Calf Left: Right: Point of Measurement: 48 cm From Medial Instep 45 cm cm Ankle Left: Right: Point of Measurement: 12 cm From Medial Instep 33.5 cm cm Vascular Assessment Pulses: Dorsalis Pedis Palpable: [Left:Yes] Electronic Signature(s) Signed: 03/20/2019 9:49:12 AM By: Carlene Coria RN Entered By: Carlene Coria on 03/19/2019 17:12:07 -------------------------------------------------------------------------------- Multi Wound Chart Details Patient Name: Date of Service: Ficken, [name] 03/19/2019 4:00 PM Medical Record [id number] Patient Account Number: [id number] Date of Birth/Sex: Treating RN: 09-22-1985 (33 y.o. M) Primary Care Katrell Milhorn: [name] Other Clinician: Referring Davone Shinault: Treating Taneesha Edgin/Extender:Robson, [name], [name]: 9 Vital Signs Height(in): 77 Capillary Blood 150 Glucose(mg/dl): Weight(lbs): 260 Pulse(bpm): 97 Body Mass Index(BMI): 31 Blood Pressure(mmHg):  144/78 Temperature(F): 98.5 Respiratory 17 Rate(breaths/min): Photos: [1:No Photos] [N/A:N/A] Wound Location: [1:Left Metatarsal head fifth N/A] Wounding Event: [1:Trauma] [N/A:N/A] Primary Etiology: [1:Diabetic Wound/Ulcer of the N/A Lower Extremity] Comorbid History: [1:Type II Diabetes] [N/A:N/A] Date Acquired: [1:08/03/2018] [N/A:N/A] Weeks of Treatment: [1:9] [N/A:N/A] Wound Status: [1:Open] [N/A:N/A] Measurements L x W x D 2.1x2.5x0.4 [N/A:N/A] (cm) Area (cm) : [1:4.123] [N/A:N/A] Volume (cm) : [1:1.649] [N/A:N/A] % Reduction in Area: [1:-40.40%] [N/A:N/A] % Reduction in Volume: 29.80% [N/A:N/A] Starting Position 1 1 (o'clock): Ending Position 1 [1:11] (o'clock): Maximum Distance 1 [1:2.4] (cm): Undermining: [1:Yes] [N/A:N/A] Classification: [1:Grade 2] [N/A:N/A] Exudate Amount: [1:Medium] [N/A:N/A] Exudate Type: [1:Serosanguineous] [N/A:N/A] Exudate Color: [1:red,  brown] [N/A:N/A] Wound Margin: [1:Well defined, not attached N/A] Granulation Amount: [1:Large (67-100%)] [N/A:N/A] Granulation Quality: [1:Pink] [N/A:N/A] Necrotic Amount: [1:None Present (0%)] [N/A:N/A] Exposed Structures: [1:Fat Layer (Subcutaneous N/A Tissue) Exposed: Yes Fascia: No Tendon: No Muscle: No Joint: No Bone: No] Epithelialization: [1:Small (1-33%)] [N/A:N/A N/A] Treatment Notes Electronic Signature(s) Signed: 03/19/2019 5:53:46 PM By: Baltazar Najjarobson, Michael MD Entered By: Baltazar Najjarobson, Michael on 03/19/2019 17:45:48 -------------------------------------------------------------------------------- Multi-Disciplinary Care Plan Details Patient Name: Date of Service: [place], [name] 03/19/2019 4:00 PM Medical Record Number:[id number] Patient Account Number: [id number] Date of Birth/Sex: Treating RN: January 23, 1986 (33 y.o. [name]) Epps, Carrie Primary Care Jayanna Kroeger: Katy ApoPolite, Ronald D Other Clinician: Referring Taneia Mealor: Treating Ziggy Reveles/Extender:Robson, Almira CoasterMichael [name], Deirdre Peeronald D Weeks in Treatment: 9 Active  Inactive Wound/Skin Impairment Nursing Diagnoses: Impaired tissue integrity Knowledge deficit related to ulceration/compromised skin integrity Goals: Patient/caregiver will verbalize understanding of skin care regimen Date Initiated: 01/11/2019 Target Resolution Date: 04/05/2019 Goal Status: Active Ulcer/skin breakdown will have a volume reduction of 30% by week 4 Date Initiated: 01/11/2019 Date Inactivated: 02/26/2019 Target Resolution Date: 02/08/2019 Goal Status: Unmet Unmet Reason: comorbities Ulcer/skin breakdown will have a volume reduction of 50% by week 8 Date Initiated: 02/26/2019 Target Resolution Date: 03/15/2019 Goal Status: Active Interventions: Assess patient/caregiver ability to obtain necessary supplies Assess patient/caregiver ability to perform ulcer/skin care regimen upon admission and as needed Assess ulceration(s) every visit Provide education on ulcer and skin care Notes: Electronic Signature(s) Signed: 03/20/2019 9:49:12 AM By: Yevonne PaxEpps, Carrie RN Entered By: Yevonne PaxEpps, Carrie on 03/19/2019 16:31:53 -------------------------------------------------------------------------------- Pain Assessment Details Patient Name: Date of Service: [place], [name] 03/19/2019 4:00 PM Medical Record Number:[id number] Patient Account Number: [id number] Date of Birth/Sex: Treating RN: January 23, 1986 (33 y.o. [name]) Epps, Carrie Primary Care Jivan Symanski: Katy ApoPolite, Ronald D Other Clinician: Referring Jonique Kulig: Treating Shaheim Mahar/Extender:Robson, Almira CoasterMichael [name], Deirdre Peeronald D Weeks in Treatment: 9 Active Problems Location of Pain Severity and Description of Pain Patient Has Paino No Site Locations Pain Management and Medication Current Pain Management: Electronic Signature(s) Signed: 03/20/2019 9:49:12 AM By: Yevonne PaxEpps, Carrie RN Entered By: Yevonne PaxEpps, Carrie on 03/19/2019 17:11:06 -------------------------------------------------------------------------------- Patient/Caregiver Education Details Patient  Name: Date of Service: [place], [name] 12/15/2020andnbsp4:00 PM Medical Record Number:[id number] Patient Account Number: [id number] Date of Birth/Gender: Treating RN: January 23, 1986 (33 y.o. Judie PetitM) Yevonne PaxEpps, Carrie Primary Care Physician: Katy ApoPolite, Ronald D Other Clinician: Referring Physician: Treating Physician/Extender:Robson, Almira CoasterMichael [name], Chalmers Guestonald D Weeks in Treatment: 9 Education Assessment Education Provided To: Patient Education Topics Provided Wound/Skin Impairment: Methods: Explain/Verbal Responses: State content correctly Electronic Signature(s) Signed: 03/20/2019 9:49:12 AM By: Yevonne PaxEpps, Carrie RN Entered By: Yevonne PaxEpps, Carrie on 03/19/2019 16:32:08 -------------------------------------------------------------------------------- Wound Assessment Details Patient Name: Date of Service: [place], [name] 03/19/2019 4:00 PM Medical Record Number:[id number] Patient Account Number: [id number] Date of Birth/Sex: Treating RN: January 23, 1986 (33 y.o. Judie PetitM) Yevonne PaxEpps, Carrie Primary Care Omarie Parcell: Katy ApoPolite, Ronald D Other Clinician: Referring Proctor Carriker: Treating Jerrianne Hartin/Extender:Robson, Almira CoasterMichael [name], Deirdre Peeronald D Weeks in Treatment: 9 Wound Status Wound Number: 1 Primary Diabetic Wound/Ulcer of the Lower Etiology: Extremity Wound Location: Left Metatarsal head fifth Wound Status: Open Wounding Event: Trauma Comorbid Type II Diabetes Date Acquired: 08/03/2018 History: Weeks Of Treatment: 9 Clustered Wound: No Wound Measurements Length: (cm) 2.1 Width: (cm) 2.5 Depth: (cm) 0.4 Area: (cm) 4.123 Volume: (cm) 1.649 % Reduction in Area: -40.4% % Reduction in Volume: 29.8% Epithelialization: Small (1-33%) Tunneling: No Undermining: Yes Starting Position (o'clock): 1 Ending Position (o'clock): 11 Maximum Distance: (cm) 2.4 Wound Description Classification: Grade 2 Foul Od Wound Margin: Well defined, not attached Slough/ Exudate Amount: Medium Exudate Type: Serosanguineous Exudate Color: red,  brown Wound Bed Granulation  Amount: Large (67-100%) Granulation Quality: Pink Fascia E Necrotic Amount: None Present (0%) Fat Laye Tendon E Muscle E Joint Ex Bone Exp Treatment Notes Wound #1 (Left Metatarsal head fifth) 1. Cleanse With Wound Cleanser 3. Primary Dressing Applied Calcium Alginate Ag Iodoflex 4. Secondary Dressing Foam Drawtex 5. Secured With Tape 7. Footwear/Offloading device applied Total Contact Cast or After Cleansing: No Fibrino No Exposed Structure xposed: No r (Subcutaneous Tissue) Exposed: Yes xposed: No xposed: No posed: No osed: No Notes patient educated regarding cast by MD and staff, to call if any concerns Electronic Signature(s) Signed: 03/20/2019 9:49:12 AM By: Yevonne Pax RN Entered By: Yevonne Pax on 03/19/2019 17:20:19 -------------------------------------------------------------------------------- Vitals Details Patient Name: Date of Service: [place], [name] 03/19/2019 4:00 PM Medical Record Number:[id number] Patient Account Number: [id number] Date of Birth/Sex: Treating RN: 03/20/1986 (33 y.o. [name]) Yevonne Pax Primary Care Branna Cortina: Katy Apo Other Clinician: Referring Hailyn Zarr: Treating Vesna Kable/Extender:Robson, Almira Coaster, Deirdre Peer Weeks in Treatment: 9 Vital Signs Time Taken: 17:08 Temperature (F): 98.5 Height (in): 77 Pulse (bpm): 97 Weight (lbs): 260 Respiratory Rate (breaths/min): 17 Body Mass Index (BMI): 30.8 Blood Pressure (mmHg): 144/78 Capillary Blood Glucose (mg/dl): 628 Reference Range: 80 - 120 mg / dl Notes patient stated last CBG was 150 Electronic Signature(s) Signed: 03/20/2019 9:49:12 AM By: Yevonne Pax RN Entered By: Yevonne Pax on 03/19/2019 17:14:04

## 2019-03-22 ENCOUNTER — Encounter (HOSPITAL_BASED_OUTPATIENT_CLINIC_OR_DEPARTMENT_OTHER): Payer: BC Managed Care – PPO | Admitting: Internal Medicine

## 2019-03-22 ENCOUNTER — Other Ambulatory Visit: Payer: Self-pay

## 2019-03-22 ENCOUNTER — Other Ambulatory Visit (HOSPITAL_COMMUNITY)
Admission: RE | Admit: 2019-03-22 | Discharge: 2019-03-22 | Disposition: A | Discharge status: Home / Self Care | Payer: BC Managed Care – PPO | Source: Other Acute Inpatient Hospital | Attending: Internal Medicine | Admitting: Internal Medicine

## 2019-03-22 DIAGNOSIS — L97521 Non-pressure chronic ulcer of other part of left foot limited to breakdown of skin: Secondary | ICD-10-CM | POA: Diagnosis not present

## 2019-03-22 DIAGNOSIS — E11621 Type 2 diabetes mellitus with foot ulcer: Secondary | ICD-10-CM | POA: Diagnosis not present

## 2019-03-22 LAB — GLUCOSE, CAPILLARY
Glucose-Capillary: 220 mg/dL — ABNORMAL HIGH (ref 70–99)
Glucose-Capillary: 222 mg/dL — ABNORMAL HIGH (ref 70–99)

## 2019-03-22 NOTE — Progress Notes (Addendum)
[place], [name] ([id number]) Visit Report for 03/22/2019 Arrival Information Details Patient Name: Date of Service: [place], [name] 03/22/2019 3:45 PM Medical Record Number:[id number] Patient Account Number: [id number] Date of Birth/Sex: Treating RN: 12/02/1985 (33 y.o. [name]) Yevonne Pax Primary Care Kjerstin Abrigo: Katy Apo Other Clinician: Referring Jawuan Robb: Treating Osiel Stick/Extender:Robson, Almira Coaster, Chalmers Guest in Treatment: 10 Visit Information History Since Last Visit All ordered tests and consults were No Patient Arrived: Ambulatory completed: Arrival Time: 15:53 Added or deleted any medications: No Accompanied By: self Any new allergies or adverse reactions: No Transfer Assistance: None Had a fall or experienced change in No Patient Identification Verified: Yes activities of daily living that may affect Secondary Verification Process Yes risk of falls: Completed: Signs or symptoms of abuse/neglect since No Patient Requires Transmission-Based No last visito Precautions: Hospitalized since last visit: No Patient Has Alerts: No Implantable device outside of the clinic No excluding cellular tissue based products placed in the center since last visit: Has Dressing in Place as Prescribed: Yes Has Footwear/Offloading in Place as Yes Prescribed: Left: Total Contact Cast Pain Present Now: No Electronic Signature(s) Signed: 03/22/2019 5:51:21 PM By: Yevonne Pax RN Entered By: Yevonne Pax on 03/22/2019 16:00:35 -------------------------------------------------------------------------------- Clinic Level of Care Assessment Details Patient Name: Date of Service: [place], [name] 03/22/2019 3:45 PM Medical Record Number:[id number] Patient Account Number: [id number] Date of Birth/Sex: Treating RN: 15-Jul-1985 (33 y.o. [name] Primary Care Marjorie Lussier: Katy Apo Other Clinician: Referring Jamond Neels: Treating Katesha Eichel/Extender:Robson,  Almira Coaster, Chalmers Guest in Treatment: 10 Clinic Level of Care Assessment Items TOOL 4 Quantity Score X - Use when only an EandM is performed on FOLLOW-UP visit 1 0 ASSESSMENTS - Nursing Assessment / Reassessment X - Reassessment of Co-morbidities (includes updates in patient status) 1 10 X - Reassessment of Adherence to Treatment Plan 1 5 ASSESSMENTS - Wound and Skin Assessment / Reassessment X - Simple Wound Assessment / Reassessment - one wound 1 5  - Complex Wound Assessment / Reassessment - multiple wounds 0  - Dermatologic / Skin Assessment (not related to wound area) 0 ASSESSMENTS - Focused Assessment X - Circumferential Edema Measurements - multi extremities 1 5  - Nutritional Assessment / Counseling / Intervention 0  - Lower Extremity Assessment (monofilament, tuning fork, pulses) 0  - Peripheral Arterial Disease Assessment (using hand held doppler) 0 ASSESSMENTS - Ostomy and/or Continence Assessment and Care  - Incontinence Assessment and Management 0  - Ostomy Care Assessment and Management (repouching, etc.) 0 PROCESS - Coordination of Care X - Simple Patient / Family Education for ongoing care 1 15  - Complex (extensive) Patient / Family Education for ongoing care 0 X - Staff obtains Chiropractor, Records, Test Results / Process Orders 1 10  - Staff telephones HHA, Nursing Homes / Clarify orders / etc 0  - Routine Transfer to another Facility (non-emergent condition) 0  - Routine Hospital Admission (non-emergent condition) 0  - New Admissions / Manufacturing engineer / Ordering NPWT, Apligraf, etc. 0  - Emergency Hospital Admission (emergent condition) 0 X - Simple Discharge Coordination 1 10  - Complex (extensive) Discharge Coordination 0 PROCESS - Special Needs  - Pediatric / Minor Patient Management 0  - Isolation Patient Management 0  - Hearing / Language / Visual special needs 0  - Assessment of Community assistance  (transportation, D/C planning, etc.) 0  - Additional assistance / Altered mentation 0  - Support Surface(s) Assessment (bed, cushion, seat, etc.) 0 INTERVENTIONS - Wound Cleansing / Measurement X - Simple  Wound Cleansing - one wound 1 5 []  - Complex Wound Cleansing - multiple wounds 0 X - Wound Imaging (photographs - any number of wounds) 1 5 []  - Wound Tracing (instead of photographs) 0 X - Simple Wound Measurement - one wound 1 5 []  - Complex Wound Measurement - multiple wounds 0 INTERVENTIONS - Wound Dressings X - Small Wound Dressing one or multiple wounds 1 10 []  - Medium Wound Dressing one or multiple wounds 0 []  - Large Wound Dressing one or multiple wounds 0 X - Application of Medications - topical 1 5 []  - Application of Medications - injection 0 INTERVENTIONS - Miscellaneous []  - External ear exam 0 []  - Specimen Collection (cultures, biopsies, blood, body fluids, etc.) 0 []  - Specimen(s) / Culture(s) sent or taken to Lab for analysis 0 []  - Patient Transfer (multiple staff / Nurse, adultHoyer Lift / Similar devices) 0 []  - Simple Staple / Suture removal (25 or less) 0 []  - Complex Staple / Suture removal (26 or more) 0 []  - Hypo / Hyperglycemic Management (close monitor of Blood Glucose) 0 []  - Ankle / Brachial Index (ABI) - do not check if billed separately 0 X - Vital Signs 1 5 Has the patient been seen at the hospital within the last three years: Yes Total Score: 95 Level Of Care: New/Established - Level 3 Electronic Signature(s) Signed: 03/22/2019 5:42:17 PM By: Cherylin Mylarwiggins, Shannon Entered By: Cherylin Mylarwiggins, Shannon on 03/22/2019 16:39:50 -------------------------------------------------------------------------------- Encounter Discharge Information Details Patient Name: Date of Service: [place], [name] 03/22/2019 3:45 PM Medical Record Number:[id number] Patient Account Number: [id number] Date of Birth/Sex: Treating RN: Aug 14, 1985 (33 y.o. [name]) Epps, Carrie Primary Care Ciearra Rufo:  Katy ApoPolite, Ronald D Other Clinician: Referring Baleria Wyman: Treating Devorah Givhan/Extender:Robson, Almira CoasterMichael [name], Chalmers Guestonald D Weeks in Treatment: 10 Encounter Discharge Information Items Discharge Condition: Stable Ambulatory Status: Ambulatory Discharge Destination: Home Transportation: Private Auto Accompanied By: self Schedule Follow-up Appointment: Yes Clinical Summary of Care: Patient Declined Electronic Signature(s) Signed: 03/22/2019 5:51:21 PM By: Yevonne PaxEpps, Carrie RN Entered By: Yevonne PaxEpps, Carrie on 03/22/2019 17:41:32 -------------------------------------------------------------------------------- Lower Extremity Assessment Details Patient Name: Date of Service: [place], [name] 03/22/2019 3:45 PM Medical Record Number:[id number] Patient Account Number: [id number] Date of Birth/Sex: Treating RN: Aug 14, 1985 (33 y.o. [name]) Epps, Carrie Primary Care Eithen Castiglia: Katy ApoPolite, Ronald D Other Clinician: Referring Isaish Alemu: Treating Braelee Herrle/Extender:Robson, Almira CoasterMichael [name], Deirdre Peeronald D Weeks in Treatment: 10 Edema Assessment Assessed: [Left: No] [Right: No] Edema: [Left: Ye] [Right: s] Calf Left: Right: Point of Measurement: 48 cm From Medial Instep 45 cm cm Ankle Left: Right: Point of Measurement: 12 cm From Medial Instep 33.5 cm cm Electronic Signature(s) Signed: 03/22/2019 5:51:21 PM By: Yevonne PaxEpps, Carrie RN Entered By: Yevonne PaxEpps, Carrie on 03/22/2019 16:18:39 -------------------------------------------------------------------------------- Multi Wound Chart Details Patient Name: Date of Service: [place], [name] 03/22/2019 3:45 PM Medical Record Number:[id number] Patient Account Number: [id number] Date of Birth/Sex: Treating RN: Aug 14, 1985 (33 y.o. M) Primary Care Zaidee Rion: Katy ApoPolite, Ronald D Other Clinician: Referring Aniza Shor: Treating Tyde Lamison/Extender:Robson, Almira CoasterMichael [name], Deirdre Peeronald D Weeks in Treatment: 10 Vital Signs Height(in): 77 Pulse(bpm): 108 Weight(lbs): 260 Blood Pressure(mmHg): 134/88 Body Mass  Index(BMI): 31 Temperature(F): 98.5 Respiratory 18 Rate(breaths/min): Photos: [1:No Photos] [N/A:N/A] Wound Location: [1:Left Metatarsal head fifth N/A] Wounding Event: [1:Trauma] [N/A:N/A] Primary Etiology: [1:Diabetic Wound/Ulcer of the N/A Lower Extremity] Comorbid History: [1:Type II Diabetes] [N/A:N/A] Date Acquired: [1:08/03/2018] [N/A:N/A] Weeks of Treatment: [1:10] [N/A:N/A] Wound Status: [1:Open] [N/A:N/A] Measurements L x W x D 1.9x2.5x0.3 [N/A:N/A] (cm) Area (cm) : [1:3.731] [N/A:N/A] Volume (cm) : [1:1.119] [N/A:N/A] % Reduction in Area: [1:-27.00%] [N/A:N/A] % Reduction  in Volume: 52.40% [N/A:N/A] Starting Position 1 1 (o'clock): Ending Position 1 [1:7] (o'clock): Maximum Distance 1 [1:1.7] (cm): Undermining: [1:Yes] [N/A:N/A] Classification: [1:Grade 2] [N/A:N/A] Exudate Amount: [1:Medium] [N/A:N/A] Exudate Type: [1:Serosanguineous] [N/A:N/A] Exudate Color: [1:red, brown] [N/A:N/A] Wound Margin: [1:Well defined, not attached N/A] Granulation Amount: [1:Large (67-100%)] [N/A:N/A] Granulation Quality: [1:Pink] [N/A:N/A] Necrotic Amount: [1:None Present (0%)] [N/A:N/A] Exposed Structures: [1:Fat Layer (Subcutaneous Tissue) Exposed: Yes Fascia: No Tendon: No Muscle: No Joint: No Bone: No Small (1-33%)] [N/A:N/A N/A] Treatment Notes Electronic Signature(s) Signed: 03/22/2019 6:01:02 PM By: [name] MD Entered By: [name] on 03/22/2019 17:18:42 -------------------------------------------------------------------------------- Multi-Disciplinary Care Plan Details Patient Name: Date of Service: [place], [name] 03/22/2019 3:45 PM Medical Record [id number] Patient Account Number: [id number] Date of Birth/Sex: Treating RN: [birth year] (33 y.o. Marvis Repress Primary Care Belina Mandile: [name] Other Clinician: Referring Vestal Markin: Treating Julane Crock/Extender:Robson, [name], [name]: 10 Active  Inactive Wound/Skin Impairment Nursing Diagnoses: Impaired tissue integrity Knowledge deficit related to ulceration/compromised skin integrity Goals: Patient/caregiver will verbalize understanding of skin care regimen Date Initiated: 01/11/2019 Target Resolution Date: 04/05/2019 Goal Status: Active Ulcer/skin breakdown will have a volume reduction of 30% by week 4 Date Initiated: 01/11/2019 Date Inactivated: 02/26/2019 Target Resolution Date: 02/08/2019 Goal Status: Unmet Unmet Reason: comorbities Ulcer/skin breakdown will have a volume reduction of 50% by week 8 Date Initiated: 02/26/2019 Target Resolution Date: 04/12/2019 Goal Status: Active Interventions: Assess patient/caregiver ability to obtain necessary supplies Assess patient/caregiver ability to perform ulcer/skin care regimen upon admission and as needed Assess ulceration(s) every visit Provide education on ulcer and skin care Notes: Electronic Signature(s) Signed: 03/22/2019 5:42:17 PM By: Kela Millin Entered By: Kela Millin on 03/22/2019 16:37:42 -------------------------------------------------------------------------------- Pain Assessment Details Patient Name: Date of Service: [place], [name] 03/22/2019 3:45 PM Medical Record [id number] Patient Account Number: [id number] Date of Birth/Sex: Treating RN: 06/08/1985 (33 y.o. Oval Linsey Primary Care Shamar Kracke: [name] Other Clinician: Referring Joseff Luckman: Treating Achaia Garlock/Extender:Robson, [name], Ashby Dawes Weeks in Treatment: 10 Active Problems Location of Pain Severity and Description of Pain Patient Has Paino No Site Locations Pain Management and Medication Current Pain Management: Electronic Signature(s) Signed: 03/22/2019 5:51:21 PM By: Carlene Coria RN Entered By: Carlene Coria on 03/22/2019 16:01:23 -------------------------------------------------------------------------------- Patient/Caregiver Education  Details Patient Name: Date of Service: [place], [name] 12/18/2020andnbsp3:45 PM Medical Record [id number] Patient Account Number: [id number] Date of Birth/Gender: 01/01/1986 (33 y.o. M) Treating RN: Kela Millin Primary Care Physician: [name] Other Clinician: Referring Physician: Treating Physician/Extender:Robson, [name], [name]: 10 Education Assessment Education Provided To: Patient Education Topics Provided Wound/Skin Impairment: Methods: Explain/Verbal Responses: State content correctly Electronic Signature(s) Signed: 03/22/2019 5:42:17 PM By: Kela Millin Entered By: Kela Millin on 03/22/2019 16:59:34 -------------------------------------------------------------------------------- Wound Assessment Details Patient Name: Date of Service: [place], [name] 03/22/2019 3:45 PM Medical Record [id number] Patient Account Number: [id number] Date of Birth/Sex: Treating RN: [birth year] (33 y.o. Jerilynn Mages) Carlene Coria Primary Care Holston Oyama: [name] Other Clinician: Referring Jeffie Widdowson: Treating Emonte Dieujuste/Extender:Robson, [name], Ashby Dawes Weeks in Treatment: 10 Wound Status Wound Number: 1 Primary Diabetic Wound/Ulcer of the Lower Etiology: Extremity Wound Location: Left Metatarsal head fifth Wound Status: Open Wounding Event: Trauma Comorbid Type II Diabetes Date Acquired: 08/03/2018 History: Weeks Of Treatment: 10 Clustered Wound: No Photos Wound Measurements Length: (cm) 1.9 Width: (cm) 2.5 Depth: (cm) 0.3 Area: (cm) 3.731 Volume: (cm) 1.119 % Reduction in Area: -27% % Reduction in Volume: 52.4% Epithelialization: Small (1-33%) Tunneling: No Undermining: Yes Starting Position (o'clock): 1 Ending Position (  o'clock): 7 Maximum Distance: (cm) 1.7 Wound Description Classification: Grade 2 Foul Odor Wound Margin: Well defined, not attached Slough/Fi Exudate Amount: Medium Exudate Type:  Serosanguineous Exudate Color: red, brown Wound Bed Granulation Amount: Large (67-100%) Granulation Quality: Pink Fascia Ex Necrotic Amount: None Present (0%) Fat Layer Tendon Ex Muscle Ex Joint Exp Bone Expo After Cleansing: No brino No Exposed Structure posed: No (Subcutaneous Tissue) Exposed: Yes posed: No posed: No osed: No sed: No Electronic Signature(s) Signed: 03/27/2019 3:48:24 PM By: [name] EMT/HBOT Signed: 03/28/2019 11:24:03 AM By: Yevonne Pax RN Previous Signature: 03/22/2019 5:51:21 PM Version By: Yevonne Pax RN Entered By: [name] on 03/27/2019 13:28:21 -------------------------------------------------------------------------------- Vitals Details Patient Name: Date of Service: [place], [name] 03/22/2019 3:45 PM Medical Record Number:[id number] Patient Account Number: [id number] Date of Birth/Sex: Treating RN: [birth year] (33 y.o. Lina Sar, [place] Primary Care Sabree Nuon: Katy Apo Other Clinician: Referring Adisyn Ruscitti: Treating Zymier Rodgers/Extender:Robson, Almira Coaster, Deirdre Peer Weeks in Treatment: 10 Vital Signs Time Taken: 16:00 Temperature (F): 98.5 Height (in): 77 Pulse (bpm): 108 Weight (lbs): 260 Respiratory Rate (breaths/min): 18 Body Mass Index (BMI): 30.8 Blood Pressure (mmHg): 134/88 Reference Range: 80 - 120 mg / dl Electronic Signature(s) Signed: 03/22/2019 5:51:21 PM By: Yevonne Pax RN Entered By: Yevonne Pax on 03/22/2019 16:01:16

## 2019-03-22 NOTE — Progress Notes (Signed)
[name], Max ([id number]) Visit Report for 03/22/2019 HPI Details Patient Name: Date of Service: [name], Max 03/22/2019 3:45 PM Medical Record [id number] Patient Account Number: [id number] Date of Birth/Sex: Treating RN: 1985-10-03 (33 y.o. 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 He comes in for review of a wound over the plantar fifth metatarsal head extending into the lateral part of the foot. He was followed for this previously by his podiatrist Dr. Cornelius Moras. As the patient tells his story he went to see podiatry first for a swelling he developed on the lateral part of his fifth metatarsal head in May. He states this was "open" by podiatry and the area closed. He was followed up in June and it was again opened callus removed and it closed promptly. There were plans being made for surgery on the fifth metatarsal head in June however his blood sugar was apparently too high for anesthesia. Apparently the area was debrided and opened again in June and it is never closed since. Looking over the records from podiatry I am really not able to follow this. It was clear when he was first seen it was before 5/14 at that point he already had a wound. By 5/17 the ulcer was resolved. I do not see anything about a procedure. On 5/28 noted to have pre-ulcerative moderate keratosis. X-ray noted 1/5 contracted toe and tailor's bunion and metatarsal deformity. On a visit date on 09/28/2018 the dorsal part of the left foot it healed and resolved. There was concern about swelling in his lower extremity he was sent to the ER.. As far as I can tell he was seen in the ER on 7/12 with an ulcer on his left foot. A DVT rule out of the  left leg was negative. I do not think I have complete records from podiatry but I am not able to verify the procedures this patient states he had. He states after the last procedure the wound has never closed although I am not able to follow this in the records I have from podiatry. He has not had a recent x-ray The patient has been using Neosporin on the wound. He is wearing a Darco shoe. He is still very active up on his foot working and exercising. Past medical history; type 2 diabetes ketosis-prone, leg swelling with a negative DVT study in July. Non-smoker ABI in our clinic was 0.85 on the left 10/16; substantial wound on the plantar left fifth met head extending laterally almost to the dorsal fifth MTP. We have been using silver alginate we gave him a Darco forefoot off loader. An x-ray did not show evidence of osteomyelitis did note soft tissue emphysema which I think was due to gas tracking through an open wound. There is no doubt in my mind he requires an MRI 10/23; MRI not booked until 3 November at the earliest this is largely due to his glucose sensor in the right arm. We have been using silver alginate. There has been an improvement 10/29; I am still not exactly sure when his MRI is booked for. He says it is the third but it is the 10th in epic. This definitely needs to be done. He is running a low-grade fever today but no other symptoms. No real improvement in the 1 02/26/2019 patient  presents today for a follow-up visit here in our clinic he is last been seen in the clinic on October 29. Subsequently we were working on getting MRI to evaluate and see what exactly was going on and where we would need to go from the standpoint of whether or not he had osteomyelitis and again what treatments were going be required. Subsequently the patient ended up being admitted to the hospital on 02/07/2019 and was discharged on 02/14/2019. This is a somewhat interesting admission with a discharge  diagnosis of pneumonia due to COVID-19 although he was positive for COVID-19 when tested at the urgent care but negative x2 when he was actually in the hospital. With that being said he did have acute respiratory failure with hypoxia and it was noted he also have a left foot ulceration with osteomyelitis. With that being said he did require oxygen for his pneumonia and I level 4 L. He was placed on antivirals and steroids for the COVID-19. He was also transferred to the [place] at one point. Nonetheless he did subsequently discharged home and since being home has done much better in that regard. The CT angiogram did not show any pulmonary embolism. With regard to the osteomyelitis the patient was placed on vancomycin and Zosyn while in the hospital but has been changed to Augmentin at discharge. It was also recommended that he follow-up with wound care and podiatry. Podiatry however wanted him to see [name] according to the patient prior to them doing anything further. His hemoglobin A1c was 9.9 as noted in the hospital. Have an MRI of the left foot performed while in the hospital on 02/04/2019. 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 There is an overlying plantar open wound noted an abscess tracking back along the lateral aspect of the fifth metatarsal shaft. There is otherwise diffuse cellulitis and mild fasciitis without findings of polymyositis. The patient did have recently pneumonia secondary to COVID-19 I looked in the chart through epic and it does appear that the patient may need to have an additional x-ray just to ensure everything is cleared and that he has no airspace disease prior to putting him into the chamber. 03/05/2019; patient was readmitted to the clinic last week. He was hospitalized twice for a viral upper respiratory tract infection from 11/1 through 11/4 and then 11/5  through 11/12 ultimately this turned out to be Covid pneumonitis. Although he was discharged on oxygen he is not using it. He says he feels fine. He has no exercise limitation no cough no sputum. His O2 sat in our clinic today was 100% on room air. 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 He received Vanco and Zosyn in the hospital and then was discharged on 2 weeks of Augmentin. I do not see any relevant cultures. He was supposed to follow-up with infectious disease but I do not see that he has an appointment. 12/8; patient saw Dr. Novella Olive of infectious disease last week. He felt that he had had adequate antibiotic therapy. He did not go to follow-up with Dr. Amalia Hailey of podiatry and I have again talked to him about the pros and cons of this. He does not want to consider a ray amputation of this time. He is aware of the risks of recurrence, migration etc. He started HBO  today and tolerated this well. He can complete the Augmentin that I gave him last week. I have looked over the lab work that Dr. Chana Bode ordered his C-reactive protein was 3.3 and his sedimentation rate was 17. The C-reactive protein is never really been measurably that high in this patient 12/15; not much change in the wound today however he has undermining along the lateral part of the foot again more extensively than last week. He has some rims of epithelialization. We have been using silver alginate. He is undergoing hyperbarics but did not dive today 12/18; in for his obligatory first total contact cast change. Unfortunately there was pus coming from the undermining area around his fifth metatarsal head. This was cultured but will preclude reapplication of a cast. He is seen in conjunction with HBO Electronic Signature(s) Signed: 03/22/2019 6:01:02 PM By: [name] MD Entered By: [name] on  03/22/2019 17:20:49 -------------------------------------------------------------------------------- Physical Exam Details Patient Name: Date of Service: [name], Max 03/22/2019 3:45 PM Medical Record [id number] Patient Account Number: [id number] Date of Birth/Sex: Treating RN: 09-Dec-1985 (33 y.o. M) Primary Care Provider: [name] Other Clinician: Referring Provider: Treating Provider/Extender:Suresh Audi, [name], Ashby Dawes Weeks in Treatment: 10 Constitutional Sitting or standing Blood Pressure is within target range for patient.. Pulse regular and within target range for patient.Marland Kitchen Respirations regular, non-labored and within target range.. Temperature is normal and within the target range for the patient.Marland Kitchen Appears in no distress. Notes Wound exam; purulent drainage coming from the superior undermining area on the low lateral part of the fifth met tarsal head. There is no tenderness although he is generally insensate. Electronic Signature(s) Signed: 03/22/2019 6:01:02 PM By: [name] MD Entered By: [name] on 03/22/2019 17:21:27 -------------------------------------------------------------------------------- Physician Orders Details Patient Name: Date of Service: [name], Max 03/22/2019 3:45 PM Medical Record [id number] Patient Account Number: [id number] Date of Birth/Sex: Treating RN: 1985/06/17 (33 y.o. 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 - MD visit Dressing Change Frequency Wound #1 Left Metatarsal head fifth Do not change entire dressing for one week. Skin Barriers/Peri-Wound Care Moisturizing lotion - to both legs Wound Cleansing Wound #1 Left Metatarsal head fifth May shower and wash wound with soap and water. - with dressing change Primary Wound Dressing Wound #1 Left Metatarsal head fifth Calcium Alginate with Silver - wound bed Secondary Dressing Wound #1 Left Metatarsal head fifth Kerlix/Rolled Gauze Dry Gauze - felt wound and shoe Off-Loading Wedge shoe to: Hyperbaric Oxygen Therapy Wound #1 Left Metatarsal head fifth Evaluate for HBO Therapy Indication: - osteomylitis left foot If appropriate for treatment, begin HBOT per protocol: 2.0 ATA for 90 Minutes without Air Breaks 2.5 ATA for 90 Minutes with 2 Five (5) Minute Air Breaks Total Number of Treatments: - 40 One treatments per day (delivered Monday through Friday unless otherwise specified in Special Instructions below): Finger stick Blood Glucose Pre- and Post- HBOT Treatment. Follow Hyperbaric Oxygen Glycemia Protocol Laboratory Bacteria identified in Unspecified specimen by Anaerobe culture (MICRO) - Left fifth metatarsal head - (ICD10 L97.521 - Non-pressure chronic ulcer  of other part of left foot limited to breakdown of skin) LOINC Code: 604-5 Convenience Name: Anerobic culture Patient Medications Allergies: metformin Notifications Medication Indication Start End doxycycline monohydrate 03/22/2019 DOSE oral 100 mg capsule - 1 capsule oral bid for 7 days GLYCEMIA INTERVENTIONS PROTOCOL PRE-HBO GLYCEMIA INTERVENTIONS ACTION INTERVENTION Obtain pre-HBO capillary blood 1 glucose (ensure physician order is in chart). A. Notify HBO physician and await physician orders. 2 If result is 70 mg/dl or below: B. If the result meets the hospital definition of a critical result,  follow hospital policy. A. Give patient an 8 ounce Glucerna Shake, an 8 ounce Ensure, or 8 ounces of a Glucerna/Ensure equivalent dietary supplement*. B. Wait 30 minutes. C. Retest patients capillary blood If result is 71 mg/dl to 130 mg/dl: glucose (CBG). D. If result greater than or equal to 110 mg/dl, proceed with HBO. If result less than 110 mg/dl, notify HBO physician and consider holding HBO. If result is 131 mg/dl to 249 mg/dl: A. Proceed with HBO. A. Notify HBO physician and await physician orders. B. It is recommended to hold HBO If result is 250 mg/dl or greater: and do blood/urine ketone testing. C. If the result meets the hospital definition of a critical result, follow hospital policy. POST-HBO GLYCEMIA INTERVENTIONS ACTION INTERVENTION Obtain post HBO capillary blood 1 glucose (ensure physician order is in chart). A. Notify HBO physician and await physician orders. 2 If result is 70 mg/dl or below: B. If the result meets the hospital definition of a critical result, follow hospital policy. A. Give patient an 8 ounce Glucerna Shake, an 8 ounce Ensure, or 8 ounces of a Glucerna/Ensure equivalent dietary supplement*. B. Wait 15 minutes for symptoms of hypoglycemia (i.e. nervousness, anxiety, sweating, chills, If result is 71 mg/dl to 100 mg/dl: clamminess, irritability, confusion, tachycardia or dizziness). C. If patient asymptomatic, discharge patient. If patient symptomatic, repeat capillary blood glucose (CBG) and notify HBO physician. If result is 101 mg/dl to 249 mg/dl: A. Discharge patient. A. Notify HBO physician and await physician orders. B. It is recommended to do If result is 250 mg/dl or greater: blood/urine ketone testing. C. If the result meets the hospital definition of a critical result, follow hospital policy. *Juice or candies are NOT equivalent products. If patient refuses the Glucerna or Ensure, please consult the hospital  dietitian for an appropriate substitute. Electronic Signature(s) Signed: 03/22/2019 4:38:25 PM By: [name] MD Entered By: [name] on 03/22/2019 16:38:24 -------------------------------------------------------------------------------- Problem List Details Patient Name: Date of Service: [name], Max 03/22/2019 3:45 PM Medical Record [id number] Patient Account Number: [id number] Date of Birth/Sex: Treating RN: [birth year] (33 y.o. 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 He comes in for review of  a wound over the plantar fifth metatarsal head extending into the lateral part of the foot. He was followed for this previously by his podiatrist Dr. Cornelius Moras. As the patient tells his story he went to see podiatry first for a swelling he developed on the lateral part of his fifth metatarsal head in May. He states this was "open" by podiatry and the area closed. He was followed up in June and it was again opened callus removed and it closed promptly. There were plans being made for surgery on the fifth metatarsal head in June however his blood sugar was apparently too high for anesthesia. Apparently the area was debrided and opened again in June and it is never closed since. Looking over the records from podiatry I am really not able to follow this. It was clear when he was first seen it was before 5/14 at that point he already had a wound. By 5/17 the ulcer was resolved. I do not see anything about a procedure. On 5/28 noted to have pre-ulcerative moderate keratosis. X-ray noted 1/5 contracted toe and tailor's bunion and metatarsal deformity. On a visit date on 09/28/2018 the dorsal part of the left foot it healed and resolved. There was concern about swelling in his lower extremity he was sent to the ER.. As far as I can tell he was seen in the ER on 7/12 with an ulcer on his left foot. A DVT rule out of the left leg was negative. I do not think I have complete records from podiatry but I am not able to verify the procedures this patient states he had. He states after the last procedure the wound has never closed although I am not able to follow this in the records I have from podiatry. He has not had a recent x-ray The patient has been using Neosporin on the wound. He is wearing a Darco shoe. He is still very active up on his foot working and exercising. Past medical history; type 2 diabetes ketosis-prone, leg swelling with a negative DVT study in July. Non-smoker ABI in our clinic was 0.85 on  the left 10/16; substantial wound on the plantar left fifth met head extending laterally almost to the dorsal fifth MTP. We have been using silver alginate we gave him a Darco forefoot off loader. An x-ray did not show evidence of osteomyelitis did note soft tissue emphysema which I think was due to gas tracking through an open wound. There is no doubt in my mind he requires an MRI 10/23; MRI not booked until 3 November at the earliest this is largely due to his glucose sensor in the right arm. We have been using silver alginate. There has been an improvement 10/29; I am still not exactly sure when his MRI is booked for. He says it is the third but it is the 10th in epic. This definitely needs to be done. He is running a low-grade fever today but no other symptoms. No real improvement in the 1 02/26/2019 patient presents today for a follow-up visit here in our clinic he is last been seen in the clinic on October 29. Subsequently we were working on getting MRI to evaluate and see what exactly was going on and where we would  need to go from the standpoint of whether or not he had osteomyelitis and again what treatments were going be required. Subsequently the patient ended up being admitted to the hospital on 02/07/2019 and was discharged on 02/14/2019. This is a somewhat interesting admission with a discharge diagnosis of pneumonia due to COVID-19 although he was positive for COVID-19 when tested at the urgent care but negative x2 when he was actually in the hospital. With that being said he did have acute respiratory failure with hypoxia and it was noted he also have a left foot ulceration with osteomyelitis. With that being said he did require oxygen for his pneumonia and I level 4 L. He was placed on antivirals and steroids for the COVID-19. He was also transferred to the [facility] at one point. Nonetheless he did subsequently discharged home and since being home has done much better in  that regard. The CT angiogram did not show any pulmonary embolism. With regard to the osteomyelitis the patient was placed on vancomycin and Zosyn while in the hospital but has been changed to Augmentin at discharge. It was also recommended that he follow-up with wound care and podiatry. Podiatry however wanted him to see [name] according to the patient prior to them doing anything further. His hemoglobin A1c was 9.9 as noted in the hospital. Have an MRI of the left foot performed while in the hospital on 02/04/2019. 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 There is an overlying plantar open wound noted an abscess tracking back along the lateral aspect of the fifth metatarsal shaft. There is otherwise diffuse cellulitis and mild fasciitis without findings of polymyositis. The patient did have recently pneumonia secondary to COVID-19 I looked in the chart through epic and it does appear that the patient may need to have an additional x-ray just to ensure everything is cleared and that he has no airspace disease prior to putting him into the chamber. 03/05/2019; patient was readmitted to the clinic last week. He was hospitalized twice for a viral upper respiratory tract infection from 11/1 through 11/4 and then 11/5 through 11/12 ultimately this turned out to be Covid pneumonitis. Although he was discharged on oxygen he is not using it. He says he feels fine. He has no exercise limitation no cough no sputum. His O2 sat in our clinic today was 100% on room air. 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 He received Vanco and Zosyn in the hospital and then was discharged on 2 weeks of Augmentin. I do not see any relevant cultures. He was supposed to follow-up with infectious disease but I do not  see that he has an appointment. 12/8; patient saw Dr. Novella Olive of infectious disease last week. He felt that he had had adequate antibiotic therapy. He did not go to follow-up with Dr. Amalia Hailey of podiatry and I have again talked to him about the pros and cons of this. He does not want to consider a ray amputation of this time. He is aware of the risks of recurrence, migration etc. He started HBO today and tolerated this well. He can complete the Augmentin that I gave him last week. I have looked over the lab work that Dr. Chana Bode ordered his C-reactive protein was 3.3 and his sedimentation rate was 17. The C-reactive protein  is never really been measurably that high in this patient 12/15; not much change in the wound today however he has undermining along the lateral part of the foot again more extensively than last week. He has some rims of epithelialization. We have been using silver alginate. He is undergoing hyperbarics but did not dive today 12/18; in for his obligatory first total contact cast change. Unfortunately there was pus coming from the undermining area around his fifth metatarsal head. This was cultured but will preclude reapplication of a cast. He is seen in conjunction with HBO Objective Constitutional Sitting or standing Blood Pressure is within target range for patient.. Pulse regular and within target range for patient.Marland Kitchen Respirations regular, non-labored and within target range.. Temperature is normal and within the target range for the patient.Marland Kitchen Appears in no distress. Vitals Time Taken: 4:00 PM, Height: 77 in, Weight: 260 lbs, BMI: 30.8, Temperature: 98.5 F, Pulse: 108 bpm, Respiratory Rate: 18 breaths/min, Blood Pressure: 134/88 mmHg. General Notes: Wound exam; purulent drainage coming from the superior undermining area on the low lateral part of the fifth met tarsal head. There is no tenderness although he is generally insensate. Integumentary (Hair, Skin) Wound #1 status  is Open. Original cause of wound was Trauma. The wound is located on the Left Metatarsal head fifth. The wound measures 1.9cm length x 2.5cm width x 0.3cm depth; 3.731cm^2 area and 1.119cm^3 volume. There is Fat Layer (Subcutaneous Tissue) Exposed exposed. There is no tunneling noted, however, there is undermining starting at 1:00 and ending at 7:00 with a maximum distance of 1.7cm. There is a medium amount of serosanguineous drainage noted. The wound margin is well defined and not attached to the wound base. There is large (67-100%) pink granulation within the wound bed. There is no necrotic tissue within the wound bed. 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 - MD visit Dressing Change Frequency: Wound #1 Left Metatarsal head fifth: Do not change entire dressing for one week. Skin Barriers/Peri-Wound Care: Moisturizing lotion - to both legs Wound Cleansing: Wound #1 Left Metatarsal head fifth: May shower and wash wound with soap and water. - with dressing change Primary Wound Dressing: Wound #1 Left Metatarsal head fifth: Calcium Alginate with Silver - wound bed Secondary Dressing: Wound #1 Left Metatarsal head fifth: Kerlix/Rolled Gauze Dry Gauze - felt wound and shoe Off-Loading: Wedge shoe to: Hyperbaric Oxygen Therapy: Wound #1 Left Metatarsal head fifth: Evaluate for HBO Therapy Indication: - osteomylitis left foot If appropriate for treatment, begin HBOT per protocol: 2.0 ATA for 90 Minutes without Air Breaks 2.5 ATA for 90 Minutes with 2 Five (5) Minute Air Breaks Total Number of Treatments: - 40 One treatments per day (delivered Monday through Friday unless  otherwise specified in Special Instructions below): Finger stick Blood Glucose Pre- and Post- HBOT Treatment. Follow Hyperbaric Oxygen Glycemia Protocol Laboratory ordered were: Anerobic culture - Left fifth metatarsal head The following medication(s) was prescribed: doxycycline monohydrate oral 100 mg capsule 1 capsule oral bid for 7 days starting 03/22/2019 1. Culture taken 2. Empiric doxycycline 100 twice daily for 7 days 3. Could not put him back in a total contact cast. Use silver alginate, 4. Follow-up next week Electronic Signature(s) Signed: 03/22/2019 6:01:02 PM By: [name] MD  Entered By: [name] on 03/22/2019 17:22:25 -------------------------------------------------------------------------------- SuperBill Details Patient Name: Date of Service: [name], Max 03/22/2019 Medical Record [id number] Patient Account Number: [id number] Date of Birth/Sex: Treating RN: 04/21/1985 (33 y.o. 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

## 2019-03-22 NOTE — Progress Notes (Addendum)
Alber, [name] ([id number]) Visit Report for 03/22/2019 HBO Details Patient Name: Date of Service: [name], [name] 03/22/2019 10:00 AM Medical Record [id number] Patient Account Number: [id number] Date of Birth/Sex: Treating RN: April 15, 1985 (33 y.o. M) Primary Care Rajanae Mantia: [name] Other Clinician: [name] Referring Jaylend Reiland: Treating Calixto Pavel/Extender:Robson, [name], [name]: 10 HBO Treatment Course Details Treatment Course Number: 1 Ordering Amantha Sklar: [name] Total Treatments Ordered: 40 HBO Treatment Start Date: 03/12/2019 HBO Indication: Diabetic Ulcer(s) of the Lower Extremity HBO Treatment Details Treatment Number: 6 Patient Type: Outpatient Chamber Type: Monoplace Chamber Serial #: [id number] Treatment Protocol: 2.0 ATA with 90 minutes oxygen, and no air breaks Treatment Details Compression Rate Down: 1.5 psi / minute De-Compression Rate Up: 2.0 psi / minute Air breaks and CompressTx Pressure breathing DecompressDecompress Begins Reached periods Begins Ends (leave unused spaces blank) Chamber Pressure (ATA)1 2 ------2 1 Clock Time (24 hr) 10:31 10:41 - - - - - - 12:11 12:19 Treatment Length: 108 (minutes) Treatment Segments: 4 Vital Signs Capillary Blood Glucose Reference Range: 80 - 120 mg / dl HBO Diabetic Blood Glucose Intervention Range: <131 mg/dl or >249 mg/dl Time Vitals Blood Respiratory Capillary Blood Glucose Pulse Action Type: Pulse: Temperature: Taken: Pressure: Rate: Glucose (mg/dl): Meter #: Oximetry (%) Taken: Pre 10:20 146/85 103 17 97.9 220 Post 12:21 143/86 79 15 98 222 Treatment Response Treatment Toleration: Well Treatment Completion Treatment Completed without Adverse Event Status: Additional Procedure Documentation Tissue Sevierity: Necrosis of bone Aiyah Scarpelli Notes No concerns with treatment given. Patient was also seen for wound care evaluation Physician HBO Attestation: I  certify that I supervised this HBO treatment in accordance with Medicare guidelines. A trained Yes emergency response team is readily available per hospital policies and procedures. Continue HBOT as ordered. Yes Electronic Signature(s) Signed: 03/22/2019 6:01:02 PM By: [name] MD Previous Signature: 03/22/2019 5:07:56 PM Version By: [name] EMT/HBOT Entered By: [name] on 03/22/2019 17:13:26 -------------------------------------------------------------------------------- HBO Safety Checklist Details Patient Name: Date of Service: Viveros, [name] 03/22/2019 10:00 AM Medical Record [id number] Patient Account Number: [id number] Date of Birth/Sex: Treating RN: 14-Mar-1986 (33 y.o. M) Primary Care Aldridge Krzyzanowski: [name] Other Clinician: [name] Referring Cheyne Boulden: Treating Jannifer Fischler/Extender:Robson, [name], [name]: 10 HBO Safety Checklist Items Safety Checklist Consent Form Signed Patient voided / foley secured and emptied When did you last eato n/a Last dose of injectable or oral agent insulin NA Ostomy pouch emptied and vented if applicable All implantable devices assessed, documented and approved NA Intravenous access site secured and place Valuables secured Linens and cotton and cotton/polyester blend (less than 51% polyester) Personal oil-based products / skin lotions / body lotions removed NA Wigs or hairpieces removed NA Smoking or tobacco materials removed Books / newspapers / magazines / loose paper removed Cologne, aftershave, perfume and deodorant removed Jewelry removed (may wrap wedding band) NA Make-up removed Hair care products removed NA Battery operated devices (external) removed NA Heating patches and chemical warmers removed NA Titanium eyewear removed NA Nail polish cured greater than 10 hours Casting material cured greater than 10 hours NA Hearing aids removed NA Loose dentures or  partials removed NA Prosthetics have been removed Patient demonstrates correct use of air break device (if applicable) Patient concerns have been addressed Patient grounding bracelet on and cord attached to chamber Specifics for Inpatients (complete in addition to above) Medication sheet sent with patient Intravenous medications needed or due during therapy sent with patient Drainage tubes (e.g. nasogastric tube or chest  tube secured and vented) Endotracheal or Tracheotomy tube secured Cuff deflated of air and inflated with saline Airway suctioned Electronic Signature(s) Signed: 03/22/2019 11:10:53 AM By: [name] EMT/HBOT Entered By: [name] on 03/22/2019 11:10:52

## 2019-03-22 NOTE — Progress Notes (Signed)
Rester, [name] ([id number]) Visit Report for 03/22/2019 SuperBill Details Patient Name: Date of Service: [name], [name] 03/22/2019 Medical Record [id number] Patient Account Number: [id number] Date of Birth/Sex: Treating RN: Apr 17, 1985 (33 y.o. 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

## 2019-03-25 ENCOUNTER — Encounter (HOSPITAL_BASED_OUTPATIENT_CLINIC_OR_DEPARTMENT_OTHER): Payer: BC Managed Care – PPO | Admitting: Internal Medicine

## 2019-03-25 ENCOUNTER — Other Ambulatory Visit: Payer: Self-pay

## 2019-03-25 DIAGNOSIS — E11621 Type 2 diabetes mellitus with foot ulcer: Secondary | ICD-10-CM | POA: Diagnosis not present

## 2019-03-25 LAB — AEROBIC CULTURE W GRAM STAIN (SUPERFICIAL SPECIMEN)

## 2019-03-25 LAB — GLUCOSE, CAPILLARY
Glucose-Capillary: 302 mg/dL — ABNORMAL HIGH (ref 70–99)
Glucose-Capillary: 150 mg/dL — ABNORMAL HIGH (ref 70–99)

## 2019-03-25 NOTE — Progress Notes (Signed)
[name] [name], [name] ([id number]) Visit Report for 03/22/2019 Arrival Information Details Patient Name: Date of Service: [name] [name], [name] 03/22/2019 10:00 AM Medical Record [id number] Patient Account Number: [id number] Date of Birth/Sex: Treating RN: [birth year] (33 y.o. M) Primary Care Kennedi Lizardo: [name] Other Clinician: [name] Referring Bowen Goyal: Treating Leea Rambeau/Extender:Robson, [name], [name]: 10 Visit Information History Since Last Visit Added or deleted any medications: No Patient Arrived: Ambulatory Any new allergies or adverse reactions: No Arrival Time: 10:15 Had a fall or experienced change in No Accompanied By: self activities of daily living that may affect Transfer Assistance: None risk of falls: Patient Identification Verified: Yes Signs or symptoms of abuse/neglect since last No Secondary Verification Process Yes visito Completed: Hospitalized since last visit: No Patient Requires Transmission-Based No Implantable device outside of the clinic excluding No Precautions: cellular tissue based products placed in the center Patient Has Alerts: No since last visit: Pain Present Now: No Electronic Signature(s) Signed: 03/22/2019 5:07:56 PM By: [name] EMT/HBOT Entered By: [name] on 03/22/2019 11:09:40 -------------------------------------------------------------------------------- Encounter Discharge Information Details Patient Name: Date of Service: [name] [name], [name] 03/22/2019 10:00 AM Medical Record [id number] Patient Account Number: [id number] Date of Birth/Sex: Treating RN: 1985/07/08 (33 y.o. M) Primary Care Colin Norment: [name] Other Clinician: [name] Referring Giorgi Debruin: Treating Mitsuye Schrodt/Extender:Robson, [name], [name]: 10 Encounter Discharge Information Items Discharge Condition: Stable Ambulatory Status: Ambulatory Discharge Destination:  Home Transportation: Private Auto Accompanied By: self Schedule Follow-up Appointment: Yes Clinical Summary of Care: Patient Declined Electronic Signature(s) Signed: 03/22/2019 5:07:56 PM By: [name] EMT/HBOT Entered By: [name] on 03/22/2019 13:32:51 -------------------------------------------------------------------------------- Patient/Caregiver Education Details Patient Name: [name] [name], [name] 12/18/2020andnbsp10:00 Date of Service: AM Medical Record [id number] Number: Patient Account Number: [id number] Treating RN: 12/31/1985 (33 y.o. Date of Birth/Gender: M) Other Clinician: [name] Primary Care Treating [name], Turner Daniels Physician: Physician/Extender: Referring Physician: Smith Mince in Treatment: 10 Education Assessment Education Provided To: Patient Education Topics Provided Hyperbaric Oxygenation: Methods: Explain/Verbal Responses: State content correctly Electronic Signature(s) Signed: 03/22/2019 5:08:52 PM By: [name] EMT/HBOT Previous Signature: 03/22/2019 5:07:56 PM Version By: [name] EMT/HBOT Entered By: [name] on 03/22/2019 17:08:38 -------------------------------------------------------------------------------- Vitals Details Patient Name: Date of Service: [name] [name], [name] 03/22/2019 10:00 AM Medical Record [id number] Patient Account Number: [id number] Date of Birth/Sex: Treating RN: 29-Oct-1985 (33 y.o. M) Primary Care Meliton Samad: [name] Other Clinician: [name] Referring Essa Malachi: Treating Jasey Cortez/Extender:Robson, [name], [name]: 10 Vital Signs Time Taken: 10:20 Temperature (F): 97.9 Height (in): 77 Pulse (bpm): 103 Weight (lbs): 260 Respiratory Rate (breaths/min): 17 Body Mass Index (BMI): 30.8 Blood Pressure (mmHg): 146/85 Capillary Blood Glucose (mg/dl): 220 Reference Range: 80 - 120 mg / dl Electronic  Signature(s) Signed: 03/22/2019 5:07:56 PM By: [name] EMT/HBOT Entered By: [name] on 03/22/2019 11:09:57

## 2019-03-25 NOTE — Progress Notes (Addendum)
[place], [name] ([id number]) Visit Report for 03/25/2019 HBO Details Patient Name: Date of Service: [name], [name] 03/25/2019 4:00 PM Medical Record Number:[id number] Patient Account Number: [id number] Date of Birth/Sex: Treating RN: [birth year] (33 y.o. M) Primary Care [name]: Katy Apo Other Clinician: [name] Referring [name]: Treating [name]/Extender:Robson, Almira Coaster, Chalmers Guest in Treatment: 10 HBO Treatment Course Details Treatment Course Number: 1 Ordering Miquan Tandon: Baltazar Najjar Total Treatments Ordered: 40 HBO Treatment Start Date: 03/12/2019 HBO Indication: Diabetic Ulcer(s) of the Lower Extremity HBO Treatment Details Treatment Number: 7 Patient Type: Outpatient Chamber Type: Monoplace Chamber Serial #: [id number] Treatment Protocol: 2.0 ATA with 90 minutes oxygen, and no air breaks Treatment Details Compression Rate Down: 2.0 psi / minute De-Compression Rate Up: 2.0 psi / minute Air breaks and CompressTx Pressure breathing DecompressDecompress Begins Reached periods Begins Ends (leave unused spaces blank) Chamber Pressure (ATA)1 2 ------2 1 Clock Time (24 hr) 16:07 16:15 - - - - - - 17:45 17:53 Treatment Length: 106 (minutes) Treatment Segments: 4 Vital Signs Capillary Blood Glucose Reference Range: 80 - 120 mg / dl HBO Diabetic Blood Glucose Intervention Range: <131 mg/dl or >944 mg/dl Time Vitals Blood Respiratory Capillary Blood Glucose Pulse Action Type: Pulse: Temperature: Taken: Pressure: Rate: Glucose (mg/dl): Meter #: Oximetry (%) Taken: Pre 15:50 115/74 109 18 97.6 299 Post 17:55 129/91 84 15 98.2 150 Treatment Response Treatment Toleration: Well Treatment Completion Treatment Completed without Adverse Event Status: Additional Procedure Documentation Tissue Sevierity: Necrosis of bone [name] Notes No concerns with treatment given Physician HBO Attestation: I certify that I supervised this HBO treatment  in accordance with Medicare guidelines. A trained Yes emergency response team is readily available per hospital policies and procedures. Continue HBOT as ordered. Yes Electronic Signature(s) Signed: 03/26/2019 7:47:18 AM By: Baltazar Najjar MD Previous Signature: 03/25/2019 6:06:17 PM Version By: [name] EMT/HBOT Entered By: Baltazar Najjar on 03/26/2019 07:45:19 -------------------------------------------------------------------------------- HBO Safety Checklist Details Patient Name: Date of Service: [name], [name] 03/25/2019 4:00 PM Medical Record Number:[id number] Patient Account Number: [id number] Date of Birth/Sex: Treating RN: [birth year] (33 y.o. M) Primary Care [name]: Katy Apo Other Clinician: [name] Referring [name]: Treating [name]/Extender:Robson, Almira Coaster, Chalmers Guest in Treatment: 10 HBO Safety Checklist Items Safety Checklist Consent Form Signed Patient voided / foley secured and emptied When did you last eato 1400 - [place] Last dose of injectable or oral agent insulin NA Ostomy pouch emptied and vented if applicable All implantable devices assessed, documented and approved NA Intravenous access site secured and place Valuables secured Linens and cotton and cotton/polyester blend (less than 51% polyester) Personal oil-based products / skin lotions / body lotions removed NA Wigs or hairpieces removed NA Smoking or tobacco materials removed Books / newspapers / magazines / loose paper removed Cologne, aftershave, perfume and deodorant removed Jewelry removed (may wrap wedding band) NA Make-up removed Hair care products removed NA Battery operated devices (external) removed NA Heating patches and chemical warmers removed NA Titanium eyewear removed NA Nail polish cured greater than 10 hours Casting material cured greater than 10 hours NA Hearing aids removed NA Loose dentures or partials removed NA Prosthetics have been  removed Patient demonstrates correct use of air break device (if applicable) Patient concerns have been addressed Patient grounding bracelet on and cord attached to chamber Specifics for Inpatients (complete in addition to above) Medication sheet sent with patient Intravenous medications needed or due during therapy sent with patient Drainage tubes (e.g. nasogastric tube or chest tube secured and vented) Endotracheal or  Tracheotomy tube secured Cuff deflated of air and inflated with saline Airway suctioned Electronic Signature(s) Signed: 03/25/2019 4:56:08 PM By: [name] EMT/HBOT Entered By: [name] on 03/25/2019 16:56:07

## 2019-03-25 NOTE — Progress Notes (Signed)
[name] [name] ([id number]) Visit Report for 03/25/2019 Arrival Information Details Patient Name: Date of Service: [name] [name] 03/25/2019 4:00 PM Medical Record [id number] Patient Account Number: [id number] Date of Birth/Sex: Treating RN: 1986-02-11 (33 y.o. M) Primary Care Jashira Cotugno: [name] Other Clinician: [name] Referring Davan Nawabi: Treating Mohsen Odenthal/Extender:Robson, [name], [name]: 10 Visit Information History Since Last Visit Added or deleted any medications: No Patient Arrived: Ambulatory Any new allergies or adverse reactions: No Arrival Time: 15:45 Had a fall or experienced change in No Accompanied By: self activities of daily living that may affect Transfer Assistance: None risk of falls: Patient Identification Verified: Yes Signs or symptoms of abuse/neglect since last No Secondary Verification Process Yes visito Completed: Hospitalized since last visit: No Patient Requires Transmission-Based No Implantable device outside of the clinic excluding No Precautions: cellular tissue based products placed in the center Patient Has Alerts: No since last visit: Pain Present Now: No Electronic Signature(s) Signed: 03/25/2019 6:06:17 PM By: [name] EMT/HBOT Entered By: [name] on 03/25/2019 16:42:07 -------------------------------------------------------------------------------- Encounter Discharge Information Details Patient Name: Date of Service: [name] [name] 03/25/2019 4:00 PM Medical Record Number:[id number] Patient Account Number: [id number] Date of Birth/Sex: Treating RN: [birth year] (33 y.o. M) Primary Care Coady Train: [name] Other Clinician: [name] Referring Nayra Coury: Treating Saim Almanza/Extender:Robson, [name], [name]: 10 Encounter Discharge Information Items Discharge Condition: Stable Ambulatory Status: Ambulatory Discharge Destination:  Home Transportation: Private Auto Accompanied By: self Schedule Follow-up Appointment: Yes Clinical Summary of Care: Patient Declined Electronic Signature(s) Signed: 03/25/2019 6:06:17 PM By: [name] EMT/HBOT Entered By: [name] on 03/25/2019 18:05:56 -------------------------------------------------------------------------------- Patient/Caregiver Education Details Patient Name: [name] [name] 12/21/2020andnbsp4:00 Date of Service: PM Medical Record [id number] Number: Patient Account Number: [id number] Treating RN: [birth year] (33 y.o. Date of Birth/Gender: M) Other Clinician: [name] Primary Care Physician: [name] Treating [name] Referring Physician: Physician/Extender: Smith Mince in Treatment: 10 Education Assessment Education Provided To: Patient Education Topics Provided Hyperbaric Oxygenation: Methods: Explain/Verbal Responses: State content correctly Electronic Signature(s) Signed: 03/25/2019 6:06:17 PM By: [name] EMT/HBOT Entered By: [name] on 03/25/2019 18:05:45 -------------------------------------------------------------------------------- Vitals Details Patient Name: Date of Service: [name] [name] 03/25/2019 4:00 PM Medical Record [id number] Patient Account Number: [id number] Date of Birth/Sex: Treating RN: [birth year] (33 y.o. M) Primary Care Quoc Tome: [name] Other Clinician: [name] Referring Alica Shellhammer: Treating Abimael Zeiter/Extender:Robson, [name], [name]: 10 Vital Signs Time Taken: 15:50 Temperature (F): 97.6 Height (in): 77 Pulse (bpm): 109 Weight (lbs): 260 Respiratory Rate (breaths/min): 18 Body Mass Index (BMI): 30.8 Blood Pressure (mmHg): 115/74 Capillary Blood Glucose (mg/dl): 299 Reference Range: 80 - 120 mg / dl Electronic Signature(s) Signed: 03/25/2019 6:06:17 PM By: [name] EMT/HBOT Entered By: [name]  on 03/25/2019 16:42:36

## 2019-03-26 ENCOUNTER — Encounter (HOSPITAL_BASED_OUTPATIENT_CLINIC_OR_DEPARTMENT_OTHER): Payer: BC Managed Care – PPO | Admitting: Internal Medicine

## 2019-03-26 ENCOUNTER — Other Ambulatory Visit: Payer: Self-pay

## 2019-03-26 DIAGNOSIS — E11621 Type 2 diabetes mellitus with foot ulcer: Secondary | ICD-10-CM | POA: Diagnosis not present

## 2019-03-26 LAB — GLUCOSE, CAPILLARY
Glucose-Capillary: 354 mg/dL — ABNORMAL HIGH (ref 70–99)
Glucose-Capillary: 334 mg/dL — ABNORMAL HIGH (ref 70–99)

## 2019-03-26 NOTE — Progress Notes (Signed)
[name], [name] ([id number]) Visit Report for 03/26/2019 Arrival Information Details Patient Name: Date of Service: [name], [name] 03/26/2019 10:00 AM Medical Record [id number] Patient Account Number: [id number] Date of Birth/Sex: Treating RN: 09/24/1985 (33 y.o. M) Primary Care Delmer Kowalski: [name] Other Clinician: [name] Referring Jevin Camino: Treating Emmitte Surgeon/Extender:Robson, [name], [name]: 10 Visit Information History Since Last Visit Added or deleted any medications: No Patient Arrived: Ambulatory Any new allergies or adverse reactions: No Arrival Time: 10:20 Had a fall or experienced change in No Accompanied By: self activities of daily living that may affect Transfer Assistance: None risk of falls: Patient Identification Verified: Yes Signs or symptoms of abuse/neglect since last No Secondary Verification Process Yes visito Completed: Hospitalized since last visit: No Patient Requires Transmission-Based No Implantable device outside of the clinic excluding No Precautions: cellular tissue based products placed in the center Patient Has Alerts: No since last visit: Pain Present Now: No Electronic Signature(s) Signed: 03/26/2019 3:57:28 PM By: [name] EMT/HBOT Entered By: [name] on 03/26/2019 11:08:09 -------------------------------------------------------------------------------- Encounter Discharge Information Details Patient Name: Date of Service: [name], [name] 03/26/2019 10:00 AM Medical Record [id number] Patient Account Number: [id number] Date of Birth/Sex: Treating RN: 1985/10/06 (33 y.o. M) Primary Care Danyeal Akens: [name] Other Clinician: [name] Referring Vermon Grays: Treating Dalisa Forrer/Extender:Robson, [name], [name]: 10 Encounter Discharge Information Items Discharge Condition: Stable Ambulatory Status: Ambulatory Discharge Destination:  Home Transportation: Private Auto Accompanied By: self Schedule Follow-up Appointment: Yes Clinical Summary of Care: Patient Declined Electronic Signature(s) Signed: 03/26/2019 3:57:28 PM By: [name] EMT/HBOT Entered By: [name] on 03/26/2019 13:21:52 -------------------------------------------------------------------------------- Patient/Caregiver Education Details Patient Name: [name], [name] 12/22/2020andnbsp10:00 Date of Service: AM Medical Record [id number] Number: Patient Account Number: [id number] Treating RN: May 17, 1985 (33 y.o. Date of Birth/Gender: M) Other Clinician: [name] Primary Care Treating [name], Turner Daniels Physician: Physician/Extender: Referring Physician: Smith Mince in Treatment: 10 Education Assessment Education Provided To: Patient Education Topics Provided Hyperbaric Oxygenation: Methods: Explain/Verbal Responses: State content correctly Electronic Signature(s) Signed: 03/26/2019 3:57:28 PM By: [name] EMT/HBOT Entered By: [name] on 03/26/2019 13:21:38 -------------------------------------------------------------------------------- Vitals Details Patient Name: Date of Service: [name], [name] 03/26/2019 10:00 AM Medical Record [id number] Patient Account Number: [id number] Date of Birth/Sex: Treating RN: [birth year] (33 y.o. M) Primary Care Valen Mascaro: [name] Other Clinician: [name] Referring Koi Yarbro: Treating Porchia Sinkler/Extender:Robson, [name], [name]: 10 Vital Signs Time Taken: 10:25 Temperature (F): 98.4 Height (in): 77 Pulse (bpm): 101 Weight (lbs): 260 Respiratory Rate (breaths/min): 17 Body Mass Index (BMI): 30.8 Blood Pressure (mmHg): 117/80 Capillary Blood Glucose (mg/dl): 354 Reference Range: 80 - 120 mg / dl Notes Anacarolina Evelyn made aware of pts elevated CBG Electronic Signature(s) Signed: 03/26/2019 3:57:28 PM By:  [name] EMT/HBOT Entered By: [name] on 03/26/2019 11:08:42

## 2019-03-26 NOTE — Progress Notes (Signed)
Sedler, [name] ([id number]) Visit Report for 03/26/2019 SuperBill Details Patient Name: Date of Service: Henard, [name] 03/26/2019 Medical Record [id number] Patient Account Number: [id number] Date of Birth/Sex: Treating RN: [birth year] (33 y.o. 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

## 2019-03-26 NOTE — Progress Notes (Signed)
Gashi, [name] ([id number]) Visit Report for 03/25/2019 SuperBill Details Patient Name: Date of Service: [name], [name] 03/25/2019 Medical Record [id number] Patient Account Number: [id number] Date of Birth/Sex: Treating RN: 1985/09/23 (33 y.o. 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

## 2019-03-26 NOTE — Progress Notes (Addendum)
[name] [name] ([id number]) Visit Report for 03/26/2019 HBO Details Patient Name: Date of Service: [name] [name] 03/26/2019 10:00 AM Medical Record [id number] Patient Account Number: [id number] Date of Birth/Sex: Treating RN: 1985-08-13 (33 y.o. M) Primary Care [name] [name]: [name] [name] Other Clinician: Mikeal [name] Referring [name] [name]: Treating [name] [name] [name] [name] in Treatment: 10 HBO Treatment Course Details Treatment Course Number: 1 Ordering [name] [name]: [name] [name] Total Treatments Ordered: 40 HBO Treatment Start Date: 03/12/2019 HBO Indication: Diabetic Ulcer(s) of the Lower Extremity HBO Treatment Details Treatment Number: 8 Patient Type: Outpatient Chamber Type: Monoplace Chamber Serial #: [id number] Treatment Protocol: 2.0 ATA with 90 minutes oxygen, and no air breaks Treatment Details Compression Rate Down: 2.0 psi / minute De-Compression Rate Up: 2.0 psi / minute Air breaks and CompressTx Pressure breathing DecompressDecompress Begins Reached periods Begins Ends (leave unused spaces blank) Chamber Pressure (ATA)1 2 ------2 1 Clock Time (24 hr) 10:42 10:50 - - - - - - 12:20 12:28 Treatment Length: 106 (minutes) Treatment Segments: 4 Vital Signs Capillary Blood Glucose Reference Range: 80 - 120 mg / dl HBO Diabetic Blood Glucose Intervention Range: <131 mg/dl or >249 mg/dl Time Vitals Blood Respiratory Capillary Blood Glucose Pulse Action Type: Pulse: Temperature: Taken: Pressure: Rate: Glucose (mg/dl): Meter #: Oximetry (%) Taken: Pre 10:25 117/80 101 17 98.4 354 Post 12:30 153/96 83 15 98.8 334 Treatment Response Treatment Toleration: Well Treatment Completion Treatment Completed without Adverse Event Status: Additional Procedure Documentation Tissue Sevierity: Necrosis of bone [name] [name] Notes No concerns with treatment given Physician HBO Attestation: I certify that I supervised this HBO treatment  in accordance with Medicare guidelines. A trained Yes emergency response team is readily available per hospital policies and procedures. Continue HBOT as ordered. Yes Electronic Signature(s) Signed: 03/26/2019 6:32:44 PM By: [name] Ham MD Previous Signature: 03/26/2019 3:57:28 PM Version By: [name] [name] EMT/HBOT Entered By: [name] [name] on 03/26/2019 18:29:19 -------------------------------------------------------------------------------- HBO Safety Checklist Details Patient Name: Date of Service: [name] [name] 03/26/2019 10:00 AM Medical Record [id number] Patient Account Number: [id number] Date of Birth/Sex: Treating RN: 12-04-1985 (33 y.o. M) Primary Care Hannalee Castor: [name] [name] Other Clinician: Mikeal [name] Referring [name] [name]: Treating [name] [name] [name] [name] in Treatment: 10 HBO Safety Checklist Items Safety Checklist Consent Form Signed Patient voided / foley secured and emptied When did you last eato 1000 - [place] Last dose of injectable or oral agent insulin NA Ostomy pouch emptied and vented if applicable All implantable devices assessed, documented and approved NA Intravenous access site secured and place Valuables secured Linens and cotton and cotton/polyester blend (less than 51% polyester) Personal oil-based products / skin lotions / body lotions removed NA Wigs or hairpieces removed NA Smoking or tobacco materials removed Books / newspapers / magazines / loose paper removed Cologne, aftershave, perfume and deodorant removed Jewelry removed (may wrap wedding band) NA Make-up removed Hair care products removed NA Battery operated devices (external) removed NA Heating patches and chemical warmers removed NA Titanium eyewear removed NA Nail polish cured greater than 10 hours NA Casting material cured greater than 10 hours NA Hearing aids removed NA Loose dentures or partials removed NA Prosthetics have  been removed Patient demonstrates correct use of air break device (if applicable) Patient concerns have been addressed Patient grounding bracelet on and cord attached to chamber Specifics for Inpatients (complete in addition to above) Medication sheet sent with patient Intravenous medications needed or due during therapy sent with patient Drainage tubes (e.g. nasogastric tube or chest tube secured and vented) Endotracheal  or Tracheotomy tube secured Cuff deflated of air and inflated with saline Airway suctioned Electronic Signature(s) Signed: 03/26/2019 11:09:33 AM By: [name] [name] EMT/HBOT Entered By: [name] [name] on 03/26/2019 11:09:33

## 2019-03-27 ENCOUNTER — Other Ambulatory Visit: Payer: Self-pay

## 2019-03-27 ENCOUNTER — Encounter (HOSPITAL_BASED_OUTPATIENT_CLINIC_OR_DEPARTMENT_OTHER): Payer: BC Managed Care – PPO | Admitting: Physician Assistant

## 2019-03-27 DIAGNOSIS — E11621 Type 2 diabetes mellitus with foot ulcer: Secondary | ICD-10-CM | POA: Diagnosis not present

## 2019-03-27 LAB — GLUCOSE, CAPILLARY
Glucose-Capillary: 212 mg/dL — ABNORMAL HIGH (ref 70–99)
Glucose-Capillary: 234 mg/dL — ABNORMAL HIGH (ref 70–99)

## 2019-03-27 NOTE — Progress Notes (Signed)
[name], Max ([id number]) Visit Report for 03/27/2019 Arrival Information Details Patient Name: Date of Service: [name], Max 03/27/2019 10:00 AM Medical Record [id number] Patient Account Number: [id number] Date of Birth/Sex: Treating RN: Jun 21, 1985 (33 y.o. M) Primary Care Alysson Geist: [name] Other Clinician: [name] Referring Bertil Brickey: Treating Illona Bulman/Extender:Stone III, Newell Coral, Ashby Dawes Weeks in Treatment: 10 Visit Information History Since Last Visit Added or deleted any medications: No Patient Arrived: Ambulatory Any new allergies or adverse reactions: No Arrival Time: 09:55 Had a fall or experienced change in No Accompanied By: self activities of daily living that may affect Transfer Assistance: None risk of falls: Patient Identification Verified: Yes Signs or symptoms of abuse/neglect since last No Secondary Verification Process Yes visito Completed: Hospitalized since last visit: No Patient Requires Transmission-Based No Implantable device outside of the clinic excluding No Precautions: cellular tissue based products placed in the center Patient Has Alerts: No since last visit: Pain Present Now: No Electronic Signature(s) Signed: 03/27/2019 3:48:24 PM By: [name] EMT/HBOT Entered By: [name] on 03/27/2019 11:01:24 -------------------------------------------------------------------------------- Encounter Discharge Information Details Patient Name: Date of Service: [name], Max 03/27/2019 10:00 AM Medical Record Number:[id number] Patient Account Number: [id number] Date of Birth/Sex: Treating RN: 22-Apr-1985 (33 y.o. M) Primary Care Shanaye Rief: [name] Other Clinician: [name] Referring Marciel Offenberger: Treating Maxximus Gotay/Extender:Stone III, Newell Coral, [name]: 10 Encounter Discharge Information Items Discharge Condition: Stable Ambulatory Status: Ambulatory Discharge Destination:  Home Transportation: Private Auto Accompanied By: self Schedule Follow-up Appointment: Yes Clinical Summary of Care: Patient Declined Electronic Signature(s) Signed: 03/27/2019 3:48:24 PM By: [name] EMT/HBOT Entered By: [name] on 03/27/2019 13:18:32 -------------------------------------------------------------------------------- Patient/Caregiver Education Details Patient Name: [name], Max 12/23/2020andnbsp10:00 Date of Service: AM Medical Record [id number] Number: Patient Account Number: [id number] Treating RN: 04-Jun-1985 (33 y.o.  Date of Birth/Gender: M) Other Clinician: [name] Primary Care Treating [name], Floyce Stakes Physician: Physician/Extender: Referring Physician: Smith Mince in Treatment: 10 Education Assessment Education Provided To: Patient Education Topics Provided Hyperbaric Oxygenation: Methods: Explain/Verbal Responses: State content correctly Electronic Signature(s) Signed: 03/27/2019 3:48:24 PM By: [name] EMT/HBOT Entered By: [name] on 03/27/2019 13:18:19 -------------------------------------------------------------------------------- Vitals Details Patient Name: Date of Service: [name], Max 03/27/2019 10:00 AM Medical Record [id number] Patient Account Number: [id number] Date of Birth/Sex: Treating RN: 1985/07/30 (33 y.o. M) Primary Care Edmundo Tedesco: [name] Other Clinician: [name] Referring Julis Haubner: Treating Rhoderick Farrel/Extender:Stone III, Newell Coral, Ashby Dawes Weeks in Treatment: 10 Vital Signs Time Taken: 10:00 Temperature (F): 97.5 Height (in): 77 Pulse (bpm): 99 Weight (lbs): 260 Respiratory Rate (breaths/min): 17 Body Mass Index (BMI): 30.8 Blood Pressure (mmHg): 148/96 Capillary Blood Glucose (mg/dl): 212 Reference Range: 80 - 120 mg / dl Electronic Signature(s) Signed: 03/27/2019 3:48:24 PM By: [name] EMT/HBOT Entered By: [name] on 03/27/2019 11:01:43

## 2019-03-27 NOTE — Progress Notes (Addendum)
Serratore, [name] ([id number]) Visit Report for 03/27/2019 HBO Details Patient Name: Date of Service: Sloan, [name] 03/27/2019 10:00 AM Medical Record [id number] Patient Account Number: [id number] Date of Birth/Sex: Treating RN: 04/16/1985 (33 y.o. M) Primary Care Macaela Presas: [name] Other Clinician: [name] Referring Creedence Heiss: Treating Sojourner Behringer/Extender:Stone III, Newell Coral, Ashby Dawes Weeks in Treatment: 10 HBO Treatment Course Details Treatment Course Number: 1 Ordering Danisha Brassfield: [name] Total Treatments Ordered: 40 HBO Treatment Start Date: 03/12/2019 HBO Indication: Diabetic Ulcer(s) of the Lower Extremity HBO Treatment Details Treatment Number: 9 Patient Type: Outpatient Chamber Type: Monoplace Chamber Serial #: [id number] Treatment Protocol: 2.0 ATA with 90 minutes oxygen, and no air breaks Treatment Details Compression Rate Down: 2.0 psi / minute De-Compression Rate Up: 2.0 psi / minute Air breaks and CompressTx Pressure breathing DecompressDecompress Begins Reached periods Begins Ends (leave unused spaces blank) Chamber Pressure (ATA)1 2 ------2 1 Clock Time (24 hr) 10:15 10:23 - - - - - - 11:53 12:01 Treatment Length: 106 (minutes) Treatment Segments: 4 Vital Signs Capillary Blood Glucose Reference Range: 80 - 120 mg / dl HBO Diabetic Blood Glucose Intervention Range: <131 mg/dl or >249 mg/dl Time Vitals Blood Respiratory Capillary Blood Glucose Pulse Action Type: Pulse: Temperature: Taken: Pressure: Rate: Glucose (mg/dl): Meter #: Oximetry (%) Taken: Pre 10:00 148/96 99 17 97.5 212 Post 12:03 145/84 75 15 97.9 234 Treatment Response Treatment Toleration: Well Treatment Completion Treatment Completed without Adverse Event Status: Additional Procedure Documentation Tissue Sevierity: Necrosis of bone Physician HBO Attestation: I certify that I supervised this HBO treatment in accordance with Medicare guidelines. A trained  Yes emergency response team is readily available per hospital policies and procedures. Continue HBOT as ordered. Yes Electronic Signature(s) Signed: 03/27/2019 3:45:28 PM By: Worthy Keeler PA-C Entered By: Worthy Keeler on 03/27/2019 15:45:27 -------------------------------------------------------------------------------- HBO Safety Checklist Details Patient Name: Date of Service: Budhu, [name] 03/27/2019 10:00 AM Medical Record [id number] Patient Account Number: [id number] Date of Birth/Sex: Treating RN: 06/07/1985 (33 y.o. M) Primary Care Azuri Bozard: [name] Other Clinician: [name] Referring Berda Shelvin: Treating Eldean Klatt/Extender:Stone III, Newell Coral, Ashby Dawes Weeks in Treatment: 10 HBO Safety Checklist Items Safety Checklist Consent Form Signed Patient voided / foley secured and emptied When did you last eato 0900 - [place] sammy Last dose of injectable or oral agent insulin NA Ostomy pouch emptied and vented if applicable All implantable devices assessed, documented and approved NA Intravenous access site secured and place Valuables secured Linens and cotton and cotton/polyester blend (less than 51% polyester) Personal oil-based products / skin lotions / body lotions removed NA Wigs or hairpieces removed NA Smoking or tobacco materials removed Books / newspapers / magazines / loose paper removed Cologne, aftershave, perfume and deodorant removed Jewelry removed (may wrap wedding band) NA Make-up removed Hair care products removed NA Battery operated devices (external) removed NA Heating patches and chemical warmers removed NA Titanium eyewear removed NA Nail polish cured greater than 10 hours NA Casting material cured greater than 10 hours NA Hearing aids removed NA Loose dentures or partials removed NA Prosthetics have been removed Patient demonstrates correct use of air break device (if applicable) Patient concerns have been  addressed Patient grounding bracelet on and cord attached to chamber Specifics for Inpatients (complete in addition to above) Medication sheet sent with patient Intravenous medications needed or due during therapy sent with patient Drainage tubes (e.g. nasogastric tube or chest tube secured and vented) Endotracheal or Tracheotomy tube secured Cuff deflated of air and inflated with saline  Airway suctioned Electronic Signature(s) Signed: 03/27/2019 11:02:27 AM By: [name] EMT/HBOT Entered By: [name] on 03/27/2019 11:02:27

## 2019-03-27 NOTE — Progress Notes (Signed)
[name], Max ([id number]) Visit Report for 03/27/2019 Problem List Details Patient Name: Date of Service: [name], Max 03/27/2019 10:00 AM Medical Record [id number] Patient Account Number: [id number] Date of Birth/Sex: Treating RN: 03/25/1986 (33 y.o. 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

## 2019-03-28 ENCOUNTER — Encounter (HOSPITAL_BASED_OUTPATIENT_CLINIC_OR_DEPARTMENT_OTHER): Payer: BC Managed Care – PPO | Admitting: Internal Medicine

## 2019-03-28 ENCOUNTER — Other Ambulatory Visit: Payer: Self-pay

## 2019-03-28 DIAGNOSIS — E11621 Type 2 diabetes mellitus with foot ulcer: Secondary | ICD-10-CM | POA: Diagnosis not present

## 2019-03-28 LAB — GLUCOSE, CAPILLARY
Glucose-Capillary: 184 mg/dL — ABNORMAL HIGH (ref 70–99)
Glucose-Capillary: 145 mg/dL — ABNORMAL HIGH (ref 70–99)

## 2019-03-28 NOTE — Progress Notes (Signed)
[name], Max ([id number]) Visit Report for 03/28/2019 Arrival Information Details Patient Name: Date of Service: [name], Max 03/28/2019 10:00 AM Medical Record [id number] Patient Account Number: [id number] Date of Birth/Sex: Treating RN: [birth year] (33 y.o. M) Primary Care Evertte Sones: [name] Other Clinician: [name] Referring Melchizedek Espinola: Treating Irene Collings/Extender:Robson, [name], [name]: 10 Visit Information History Since Last Visit Added or deleted any medications: No Patient Arrived: Ambulatory Any new allergies or adverse reactions: No Arrival Time: 09:15 Had a fall or experienced change in No Accompanied By: self activities of daily living that may affect Transfer Assistance: None risk of falls: Patient Identification Verified: Yes Signs or symptoms of abuse/neglect since last No Secondary Verification Process Yes visito Completed: Hospitalized since last visit: No Patient Requires Transmission-Based No Implantable device outside of the clinic excluding No Precautions: cellular tissue based products placed in the center Patient Has Alerts: No since last visit: Pain Present Now: No Electronic Signature(s) Signed: 03/28/2019 11:48:58 AM By: [name] EMT/HBOT Entered By: [name] on 03/28/2019 10:02:57 -------------------------------------------------------------------------------- Encounter Discharge Information Details Patient Name: Date of Service: [name], Max 03/28/2019 10:00 AM Medical Record [id number] Patient Account Number: [id number] Date of Birth/Sex: Treating RN: [birth year] (33 y.o. M) Primary Care Kallista Pae: [name] Other Clinician: [name] Referring Namita Yearwood: Treating Rusty Glodowski/Extender:Robson, [name], [name]: 10 Encounter Discharge Information Items Discharge Condition: Stable Ambulatory Status: Ambulatory Discharge Destination:  Home Transportation: Private Auto Accompanied By: self Schedule Follow-up Appointment: Yes Clinical Summary of Care: Patient Declined Electronic Signature(s) Signed: 03/28/2019 11:48:58 AM By: [name] EMT/HBOT Entered By: [name] on 03/28/2019 11:44:24 -------------------------------------------------------------------------------- Patient/Caregiver Education Details Patient Name: [name], Max 12/24/2020andnbsp10:00 Date of Service: AM Medical Record [id number] Number: Patient Account Number: [id number] Treating RN: February 04, 1986 (33 y.o. Date of Birth/Gender: M) Other Clinician: [name] Primary Care Treating [name], Turner Daniels Physician: Physician/Extender: Referring Physician: Smith Mince in Treatment: 10 Education Assessment Education Provided To: Patient Education Topics Provided Hyperbaric Oxygenation: Methods: Explain/Verbal Responses: State content correctly Electronic Signature(s) Signed: 03/28/2019 11:48:58 AM By: [name] EMT/HBOT Entered By: [name] on 03/28/2019 11:44:13 -------------------------------------------------------------------------------- Vitals Details Patient Name: Date of Service: [name], Max 03/28/2019 10:00 AM Medical Record [id number] Patient Account Number: [id number] Date of Birth/Sex: Treating RN: 10/06/1985 (33 y.o. M) Primary Care Konner Warrior: [name] Other Clinician: [name] Referring Rhyan Wolters: Treating Alexandra Posadas/Extender:Robson, [name], [name]: 10 Vital Signs Time Taken: 09:20 Temperature (F): 97.8 Height (in): 77 Pulse (bpm): 102 Weight (lbs): 260 Respiratory Rate (breaths/min): 16 Body Mass Index (BMI): 30.8 Blood Pressure (mmHg): 150/99 Capillary Blood Glucose (mg/dl): 184 Reference Range: 80 - 120 mg / dl Electronic Signature(s) Signed: 03/28/2019 11:48:58 AM By: [name] EMT/HBOT Entered By: [name] on 03/28/2019 10:03:17

## 2019-03-28 NOTE — Progress Notes (Signed)
Wiederholt, [name] ([id number]) Visit Report for 03/28/2019 SuperBill Details Patient Name: Date of Service: [name], [name] 03/28/2019 Medical Record [id number] Patient Account Number: [id number] Date of Birth/Sex: Treating RN: 1985/05/19 (33 y.o. 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

## 2019-03-28 NOTE — Progress Notes (Addendum)
Betty, [name] ([id number]) Visit Report for 03/28/2019 HBO Details Patient Name: Date of Service: [place], [name] 03/28/2019 10:00 AM Medical Record Number:[id number] Patient Account Number: [id number] Date of Birth/Sex: Treating RN: 07-Dec-1985 (33 y.o. M) Primary Care Royalti Schauf: Katy Apo Other Clinician: [name] Referring Burlon Centrella: Treating Markeise Mathews/Extender:Robson, Almira Coaster, Chalmers Guest in Treatment: 10 HBO Treatment Course Details Treatment Course Number: 1 Ordering Brynnley Dayrit: Baltazar Najjar Total Treatments Ordered: 40 HBO Treatment Start Date: 03/12/2019 HBO Indication: Diabetic Ulcer(s) of the Lower Extremity HBO Treatment Details Treatment Number: 10 Patient Type: Outpatient Chamber Type: Monoplace Chamber Serial #: [id number] Treatment Protocol: 2.0 ATA with 90 minutes oxygen, and no air breaks Treatment Details Compression Rate Down: 2.0 psi / minute De-Compression Rate Up: 2.0 psi / minute Air breaks and CompressTx Pressure breathing DecompressDecompress Begins Reached periods Begins Ends (leave unused spaces blank) Chamber Pressure (ATA)1 2 ------2 1 Clock Time (24 hr) 09:37 09:45 - - - - - - 11:15 11:23 Treatment Length: 106 (minutes) Treatment Segments: 4 Vital Signs Capillary Blood Glucose Reference Range: 80 - 120 mg / dl HBO Diabetic Blood Glucose Intervention Range: <131 mg/dl or >403 mg/dl Time Vitals Blood Respiratory Capillary Blood Glucose Pulse Action Type: Pulse: Temperature: Taken: Pressure: Rate: Glucose (mg/dl): Meter #: Oximetry (%) Taken: Pre 09:20 150/99 102 16 97.8 184 Post 11:25 135/78 75 15 98 145 Treatment Response Treatment Toleration: Well Treatment Completion Treatment Completed without Adverse Event Status: Additional Procedure Documentation Tissue Sevierity: Necrosis of bone Ludy Messamore Notes No concerns with treatment given. Patient was also seen for wound care evaluation Physician HBO Attestation: I  certify that I supervised this HBO treatment in accordance with Medicare guidelines. A trained Yes emergency response team is readily available per hospital policies and procedures. Continue HBOT as ordered. Yes Electronic Signature(s) Signed: 03/28/2019 12:25:31 PM By: Baltazar Najjar MD Previous Signature: 03/28/2019 11:48:58 AM Version By: [name] EMT/HBOT Entered By: Baltazar Najjar on 03/28/2019 12:24:34 -------------------------------------------------------------------------------- HBO Safety Checklist Details Patient Name: Date of Service: [place], [name] 03/28/2019 10:00 AM Medical Record Number:[id number] Patient Account Number: [id number] Date of Birth/Sex: Treating RN: 02/02/1986 (33 y.o. M) Primary Care Lori Liew: Katy Apo Other Clinician: [name] Referring Coraima Tibbs: Treating Jaamal Farooqui/Extender:Robson, Almira Coaster, Chalmers Guest in Treatment: 10 HBO Safety Checklist Items Safety Checklist Consent Form Signed Patient voided / foley secured and emptied When did you last eato n/a Last dose of injectable or oral agent insulin NA Ostomy pouch emptied and vented if applicable All implantable devices assessed, documented and approved NA Intravenous access site secured and place Valuables secured Linens and cotton and cotton/polyester blend (less than 51% polyester) Personal oil-based products / skin lotions / body lotions removed NA Wigs or hairpieces removed NA Smoking or tobacco materials removed Books / newspapers / magazines / loose paper removed Cologne, aftershave, perfume and deodorant removed Jewelry removed (may wrap wedding band) NA Make-up removed Hair care products removed NA Battery operated devices (external) removed NA Heating patches and chemical warmers removed NA Titanium eyewear removed NA Nail polish cured greater than 10 hours NA Casting material cured greater than 10 hours NA Hearing aids removed NA Loose dentures or  partials removed NA Prosthetics have been removed Patient demonstrates correct use of air break device (if applicable) Patient concerns have been addressed Patient grounding bracelet on and cord attached to chamber Specifics for Inpatients (complete in addition to above) Medication sheet sent with patient Intravenous medications needed or due during therapy sent with patient Drainage tubes (e.g. nasogastric tube or  chest tube secured and vented) Endotracheal or Tracheotomy tube secured Cuff deflated of air and inflated with saline Airway suctioned Electronic Signature(s) Signed: 03/28/2019 10:03:53 AM By: [name] EMT/HBOT Entered By: [name] on 03/28/2019 10:03:53

## 2019-03-28 NOTE — Progress Notes (Signed)
[name], Max ([id number]) Visit Report for 03/28/2019 Debridement Details Patient Name: Date of Service: [name], Max 03/28/2019 8:00 AM Medical Record [id number] Patient Account Number: [id number] Date of Birth/Sex: 01-04-1986 (33 y.o. M) Treating RN: Primary Care Provider: [name] Other Clinician: Referring Provider: Treating Provider/Extender:Honey Zakarian, [name], [name]: 10 Debridement Performed for Wound #1 Left Metatarsal head fifth Assessment: Performed By: Physician [name], MD Debridement Type: Debridement Severity of Tissue Pre Fat layer exposed Debridement: Level of Consciousness (Pre- Awake and Alert procedure): Pre-procedure Verification/Time Out Taken: Yes - 08:50 Start Time: 08:51 Pain Control: Lidocaine 4% Topical Solution Total Area Debrided (L x W): 1 (cm) x 1 (cm) = 1 (cm) Tissue and other material Viable, Non-Viable, Callus, Subcutaneous, Skin: Dermis , Fibrin/Exudate debrided: Level: Skin/Subcutaneous Tissue Debridement Description: Excisional Instrument: Curette Bleeding: Minimum Hemostasis Achieved: Pressure End Time: 08:54 Procedural Pain: 0 Post Procedural Pain: 0 Response to Treatment: Procedure was tolerated well Level of Consciousness Awake and Alert (Post-procedure): Post Debridement Measurements of Total Wound Length: (cm) 2.1 Width: (cm) 2.5 Depth: (cm) 0.4 Volume: (cm) 1.649 Character of Wound/Ulcer Post Improved Debridement: Severity of Tissue Post Debridement: Fat layer exposed Post Procedure Diagnosis Same as Pre-procedure Electronic Signature(s) Signed: 03/28/2019 12:25:31 PM By: [name] MD Entered By: [name] on 03/28/2019 09:02:12 -------------------------------------------------------------------------------- HPI Details Patient Name: Date of Service: [name], Max 03/28/2019 8:00 AM Medical Record Number:[id number] Patient Account Number:  [id number] Date of Birth/Sex: Treating RN: [birth year] (33 y.o. 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 He comes in for review of a wound over the plantar fifth metatarsal head extending into the lateral part of the foot. He was followed for this previously by his podiatrist Dr. Cornelius Moras. As the patient tells his story he went to see podiatry first for a swelling he developed on the lateral part of his fifth metatarsal head in May. He states this was "open" by podiatry and the area closed. He was followed up in June and it was again opened callus removed and it closed promptly. There were plans being made for surgery on the fifth metatarsal head in June however his blood sugar was apparently too high for anesthesia. Apparently the area was debrided and opened again in June and it is never closed since. Looking over the records from podiatry I am really not able to follow this. It was clear when he was first seen it was before 5/14 at that point he already had a wound. By 5/17 the ulcer was resolved. I do not see anything about a procedure. On 5/28 noted to have pre-ulcerative moderate keratosis. X-ray noted 1/5 contracted toe and tailor's bunion and metatarsal deformity. On a visit date on 09/28/2018 the dorsal part of the left foot it healed and resolved. There was concern about swelling in his lower extremity he was sent to the ER.. As far as I can tell he was seen in the ER on 7/12 with an ulcer on his left foot. A DVT rule out of the left leg was negative. I do not think I have complete records from podiatry but I am not able to verify the procedures this patient states he had. He states after the last procedure the wound has  never closed although I am not able to  follow this in the records I have from podiatry. He has not had a recent x-ray The patient has been using Neosporin on the wound. He is wearing a Darco shoe. He is still very active up on his foot working and exercising. Past medical history; type 2 diabetes ketosis-prone, leg swelling with a negative DVT study in July. Non-smoker ABI in our clinic was 0.85 on the left 10/16; substantial wound on the plantar left fifth met head extending laterally almost to the dorsal fifth MTP. We have been using silver alginate we gave him a Darco forefoot off loader. An x-ray did not show evidence of osteomyelitis did note soft tissue emphysema which I think was due to gas tracking through an open wound. There is no doubt in my mind he requires an MRI 10/23; MRI not booked until 3 November at the earliest this is largely due to his glucose sensor in the right arm. We have been using silver alginate. There has been an improvement 10/29; I am still not exactly sure when his MRI is booked for. He says it is the third but it is the 10th in epic. This definitely needs to be done. He is running a low-grade fever today but no other symptoms. No real improvement in the 1 02/26/2019 patient presents today for a follow-up visit here in our clinic he is last been seen in the clinic on October 29. Subsequently we were working on getting MRI to evaluate and see what exactly was going on and where we would need to go from the standpoint of whether or not he had osteomyelitis and again what treatments were going be required. Subsequently the patient ended up being admitted to the hospital on 02/07/2019 and was discharged on 02/14/2019. This is a somewhat interesting admission with a discharge diagnosis of pneumonia due to COVID-19 although he was positive for COVID-19 when tested at the urgent care but negative x2 when he was actually in the hospital. With that being said he did have  acute respiratory failure with hypoxia and it was noted he also have a left foot ulceration with osteomyelitis. With that being said he did require oxygen for his pneumonia and I level 4 L. He was placed on antivirals and steroids for the COVID-19. He was also transferred to the [place] at one point. Nonetheless he did subsequently discharged home and since being home has done much better in that regard. The CT angiogram did not show any pulmonary embolism. With regard to the osteomyelitis the patient was placed on vancomycin and Zosyn while in the hospital but has been changed to Augmentin at discharge. It was also recommended that he follow-up with wound care and podiatry. Podiatry however wanted him to see [name] according to the patient prior to them doing anything further. His hemoglobin A1c was 9.9 as noted in the hospital. Have an MRI of the left foot performed while in the hospital on 02/04/2019. 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 There is an overlying plantar open wound noted an abscess tracking back along the lateral aspect of the fifth metatarsal shaft. There is otherwise diffuse cellulitis and mild fasciitis without findings of polymyositis. The patient did have recently pneumonia secondary to COVID-19 I looked in the chart through epic and it does appear that the patient may need to have an additional x-ray just to ensure everything is cleared and that he has no  airspace disease prior to putting him into the chamber. 03/05/2019; patient was readmitted to the clinic last week. He was hospitalized twice for a viral upper respiratory tract infection from 11/1 through 11/4 and then 11/5 through 11/12 ultimately this turned out to be Covid pneumonitis. Although he was discharged on oxygen he is not using it. He says he feels fine. He has no exercise limitation no cough no sputum.  His O2 sat in our clinic today was 100% on room air. 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 He received Vanco and Zosyn in the hospital and then was discharged on 2 weeks of Augmentin. I do not see any relevant cultures. He was supposed to follow-up with infectious disease but I do not see that he has an appointment. 12/8; patient saw Dr. Novella Olive of infectious disease last week. He felt that he had had adequate antibiotic therapy. He did not go to follow-up with Dr. Amalia Hailey of podiatry and I have again talked to him about the pros and cons of this. He does not want to consider a ray amputation of this time. He is aware of the risks of recurrence, migration etc. He started HBO today and tolerated this well. He can complete the Augmentin that I gave him last week. I have looked over the lab work that Dr. Chana Bode ordered his C-reactive protein was 3.3 and his sedimentation rate was 17. The C-reactive protein is never really been measurably that high in this patient 12/15; not much change in the wound today however he has undermining along the lateral part of the foot again more extensively than last week. He has some rims of epithelialization. We have been using silver alginate. He is undergoing hyperbarics but did not dive today 12/18; in for his obligatory first total contact cast change. Unfortunately there was pus coming from the undermining area around his fifth metatarsal head. This was cultured but will preclude reapplication of a cast. He is seen in conjunction with HBO 12/24; patient had staph lugdunensis in the wound in the undermining area laterally last time. We put him on doxycycline which should have covered this. The wound looks better today. I am going to give him another week of doxycycline before reattempting the total contact cast Electronic Signature(s) Signed:  03/28/2019 12:25:31 PM By: [name] MD Entered By: [name] on 03/28/2019 09:03:05 -------------------------------------------------------------------------------- Physical Exam Details Patient Name: Date of Service: [name], Max 03/28/2019 8:00 AM Medical Record [id number] Patient Account Number: [id number] Date of Birth/Sex: Treating RN: [birth year] (33 y.o. M) Primary Care Provider: [name] Other Clinician: Referring Provider: Treating Provider/Extender:Violet Cart, [name], Ashby Dawes Weeks in Treatment: 10 Constitutional Sitting or standing Blood Pressure is within target range for patient.. Pulse regular and within target range for patient.[name] Kitchen Respirations regular, non-labored and within target range.. Temperature is normal and within the target range for the patient.[name] Kitchen Appears in no distress. Cardiovascular Pedal pulses palpable and strong bilaterally.. Notes Wound exam; the purulent drainage from last time has resolved. I used a #5 curette to remove surface debris surrounding eschar and I removed the skin and subcutaneous tissue from the undermining area laterally. Electronic Signature(s) Signed: 03/28/2019 12:25:31 PM By: [name] MD Entered By: [name] on 03/28/2019 09:08:20 -------------------------------------------------------------------------------- Physician Orders Details Patient Name: Date of Service: Bhandari, Max 03/28/2019 8:00 AM Medical Record [id number] Patient Account Number: [id number] Date of Birth/Sex: Treating RN:  03/25/1986 (33 y.o. 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 - MD visit Dressing Change Frequency Wound #1 Left Metatarsal head fifth Change dressing every day. Wound Cleansing Wound #1 Left Metatarsal head fifth May shower and wash wound with soap and water. - with dressing change Primary Wound Dressing Wound #1 Left Metatarsal head fifth Calcium Alginate with Silver - wound bed Secondary Dressing Wound #1 Left Metatarsal head fifth Kerlix/Rolled Gauze Dry Gauze - felt wound and shoe Off-Loading Wedge shoe to: Patient Medications Allergies: metformin Notifications Medication Indication Start End doxycycline monohydrate 03/28/2019 DOSE oral 100 mg capsule - 1 capsule oral bid for 7 days continuing rx Electronic Signature(s) Signed: 03/28/2019 9:12:37 AM By: [name] MD Entered By: [name] on 03/28/2019 09:12:36 -------------------------------------------------------------------------------- Problem List Details Patient Name: Date of Service: [name], Max 03/28/2019 8:00 AM Medical Record [id number] Patient Account Number: [id number] Date of Birth/Sex: Treating RN: 14-Oct-1985 (33 y.o. 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 He comes in for review of a wound over the plantar fifth metatarsal head extending into the lateral part of the foot. He was followed for this previously by his podiatrist Dr. Cornelius Moras. As the patient tells his story he went to see podiatry first for a swelling he developed on the lateral part of his fifth metatarsal head in May. He states this was "open" by podiatry and the area closed. He was followed up in June and it was again opened callus removed and it closed promptly. There were plans being made for surgery on the fifth metatarsal head in June however his blood sugar was apparently too high for anesthesia. Apparently the area was debrided and opened again in June and it is never closed since. Looking over the records from podiatry I am really not able to follow this. It was clear when he was first seen it was before 5/14 at that point he already had a wound. By 5/17 the ulcer was resolved. I do not see anything about a procedure. On  5/28 noted to have pre-ulcerative moderate keratosis. X-ray noted 1/5 contracted toe and tailor's bunion and metatarsal deformity. On a visit date on  09/28/2018 the dorsal part of the left foot it healed and resolved. There was concern about swelling in his lower extremity he was sent to the ER.. As far as I can tell he was seen in the ER on 7/12 with an ulcer on his left foot. A DVT rule out of the left leg was negative. I do not think I have complete records from podiatry but I am not able to verify the procedures this patient states he had. He states after the last procedure the wound has never closed although I am not able to follow this in the records I have from podiatry. He has not had a recent x-ray The patient has been using Neosporin on the wound. He is wearing a Darco shoe. He is still very active up on his foot working and exercising. Past medical history; type 2 diabetes ketosis-prone, leg swelling with a negative DVT study in July. Non-smoker ABI in our clinic was 0.85 on the left 10/16; substantial wound on the plantar left fifth met head extending laterally almost to the dorsal fifth MTP. We have been using silver alginate we gave him a Darco forefoot off loader. An x-ray did not show evidence of osteomyelitis did note soft tissue emphysema which I think was due to gas tracking through an open wound. There is no doubt in my mind he requires an MRI 10/23; MRI not booked until 3 November at the earliest this is largely due to his glucose sensor in the right arm. We have been using silver alginate. There has been an improvement 10/29; I am still not exactly sure when his MRI is booked for. He says it is the third but it is the 10th in epic. This definitely needs to be done. He is running a low-grade fever today but no other symptoms. No real improvement in the 1 02/26/2019 patient presents today for a follow-up visit here in our clinic he is last been seen in the clinic on October 29. Subsequently we were working on getting MRI to evaluate and see what exactly was going on and where we would need to go from the standpoint of  whether or not he had osteomyelitis and again what treatments were going be required. Subsequently the patient ended up being admitted to the hospital on 02/07/2019 and was discharged on 02/14/2019. This is a somewhat interesting admission with a discharge diagnosis of pneumonia due to COVID-19 although he was positive for COVID-19 when tested at the urgent care but negative x2 when he was actually in the hospital. With that being said he did have acute respiratory failure with hypoxia and it was noted he also have a left foot ulceration with osteomyelitis. With that being said he did require oxygen for his pneumonia and I level 4 L. He was placed on antivirals and steroids for the COVID-19. He was also transferred to the [place] at one point. Nonetheless he did subsequently discharged home and since being home has done much better in that regard. The CT angiogram did not show any pulmonary embolism. With regard to the osteomyelitis the patient was placed on vancomycin and Zosyn while in the hospital but has been changed to Augmentin at discharge. It was also recommended that he follow-up with wound care and podiatry. Podiatry however wanted him to see [name] according to the patient prior to  them doing anything further. His hemoglobin A1c was 9.9 as noted in the hospital. Have an MRI of the left foot performed while in the hospital on 02/04/2019. 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 There is an overlying plantar open wound noted an abscess tracking back along the lateral aspect of the fifth metatarsal shaft. There is otherwise diffuse cellulitis and mild fasciitis without findings of polymyositis. The patient did have recently pneumonia secondary to COVID-19 I looked in the chart through epic and it does appear that the patient may need to have an additional x-ray just to ensure everything  is cleared and that he has no airspace disease prior to putting him into the chamber. 03/05/2019; patient was readmitted to the clinic last week. He was hospitalized twice for a viral upper respiratory tract infection from 11/1 through 11/4 and then 11/5 through 11/12 ultimately this turned out to be Covid pneumonitis. Although he was discharged on oxygen he is not using it. He says he feels fine. He has no exercise limitation no cough no sputum. His O2 sat in our clinic today was 100% on room air. 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 He received Vanco and Zosyn in the hospital and then was discharged on 2 weeks of Augmentin. I do not see any relevant cultures. He was supposed to follow-up with infectious disease but I do not see that he has an appointment. 12/8; patient saw Dr. Novella Olive of infectious disease last week. He felt that he had had adequate antibiotic therapy. He did not go to follow-up with Dr. Amalia Hailey of podiatry and I have again talked to him about the pros and cons of this. He does not want to consider a ray amputation of this time. He is aware of the risks of recurrence, migration etc. He started HBO today and tolerated this well. He can complete the Augmentin that I gave him last week. I have looked over the lab work that Dr. Chana Bode ordered his C-reactive protein was 3.3 and his sedimentation rate was 17. The C-reactive protein is never really been measurably that high in this patient 12/15; not much change in the wound today however he has undermining along the lateral part of the foot again more extensively than last week. He has some rims of epithelialization. We have been using silver alginate. He is undergoing hyperbarics but did not dive today 12/18; in for his obligatory first total contact cast change. Unfortunately there was pus coming from the  undermining area around his fifth metatarsal head. This was cultured but will preclude reapplication of a cast. He is seen in conjunction with HBO 12/24; patient had staph lugdunensis in the wound in the undermining area laterally last time. We put him on doxycycline which should have covered this. The wound looks better today. I am going to give him another week of doxycycline before reattempting the total contact cast Objective Constitutional Sitting or standing Blood Pressure is within target range for patient.. Pulse regular and within target range for patient.[name] Kitchen Respirations regular, non-labored and within target range.. Temperature is normal and within the target range for the patient.[name] Kitchen Appears in no distress. Vitals Time Taken: 8:37 AM, Height: 77 in, Weight: 260 lbs, BMI: 30.8, Temperature: 98.4 F, Pulse: 90 bpm, Respiratory Rate: 18 breaths/min, Blood Pressure: 127/71 mmHg. Cardiovascular  Pedal pulses palpable and strong bilaterally.. General Notes: Wound exam; the purulent drainage from last time has resolved. I used a #5 curette to remove surface debris surrounding eschar and I removed the skin and subcutaneous tissue from the undermining area laterally. Integumentary (Hair, Skin) Wound #1 status is Open. Original cause of wound was Trauma. The wound is located on the Left Metatarsal head fifth. The wound measures 2.1cm length x 2.5cm width x 0.4cm depth; 4.123cm^2 area and 1.649cm^3 volume. There is Fat Layer (Subcutaneous Tissue) Exposed exposed. There is no tunneling or undermining noted. There is a medium amount of serosanguineous drainage noted. The wound margin is well defined and not attached to the wound base. There is large (67-100%) pink granulation within the wound bed. There is no necrotic tissue within the wound bed. 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 There was a Excisional Skin/Subcutaneous Tissue Debridement with a total area of 1 sq cm performed by [name], MD. With the following instrument(s): Curette to remove Viable and Non-Viable tissue/material. Material removed includes Callus, Subcutaneous Tissue, Skin: Dermis, and Fibrin/Exudate after achieving pain control using Lidocaine 4% Topical Solution. A time out was conducted at 08:50, prior to the start of the procedure. A Minimum amount of bleeding was controlled with Pressure. The procedure was tolerated well with a pain level of 0 throughout and a pain level of 0 following the procedure. Post Debridement Measurements: 2.1cm length x 2.5cm width x 0.4cm depth; 1.649cm^3 volume. Character of Wound/Ulcer Post Debridement is improved. Severity of Tissue Post Debridement is: Fat layer exposed. Post procedure Diagnosis Wound #1: Same as Pre-Procedure Plan Follow-up Appointments: Return Appointment in 1 week. - MD visit Dressing Change Frequency: Wound #1 Left Metatarsal head fifth: Change dressing every day. Wound Cleansing: Wound #1 Left Metatarsal head fifth: May shower and wash wound with soap and water. - with dressing change Primary Wound Dressing: Wound #1 Left Metatarsal head fifth: Calcium Alginate with Silver - wound bed Secondary Dressing: Wound #1 Left Metatarsal head fifth: Kerlix/Rolled Gauze Dry Gauze - felt wound and shoe Off-Loading: Wedge shoe to: The following medication(s) was prescribed: doxycycline monohydrate oral 100 mg capsule 1 capsule oral bid for 7 days continuing rx starting  03/28/2019 1. Continue with silver alginate 2. I am going to give him another week of doxycycline he is already completed IV antibiotics for osteomyelitis 3. Placement of total contact cast next week if everything is stable. Electronic Signature(s) Signed: 03/28/2019 9:12:57 AM By: [name] MD Entered By: [name] on 03/28/2019 09:12:57 -------------------------------------------------------------------------------- SuperBill Details Patient Name: Date of Service: [name], Max 03/28/2019 Medical Record [id number] Patient Account Number: [id number] Date of Birth/Sex: Treating RN: 07/26/1985 (33 y.o. 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, unspecified Facility  Procedures CPT4 Code Description: [id number] 24175 - DEB SUBQ TISSUE 20 SQ CM/< ICD-10 Diagnosis Description L97.521 Non-pressure chronic ulcer of other part of left foot lim E11.621 Type 2 diabetes mellitus with foot ulcer Modifier: ited to breakdo Quantity: 1 wn of skin Physician Procedures CPT4 Code Description: 3010404 11042 - WC PHYS SUBQ TISS 20 SQ CM ICD-10 Diagnosis Description L97.521 Non-pressure chronic ulcer of other part of left foot lim E11.621 Type 2 diabetes mellitus with foot ulcer Modifier: ited to breakdo Quantity: 1 wn of skin Electronic Signature(s) Signed: 03/28/2019 12:25:31 PM By: [name] MD Entered By: [name] on 03/28/2019 09:10:31

## 2019-04-01 ENCOUNTER — Other Ambulatory Visit: Payer: Self-pay

## 2019-04-01 ENCOUNTER — Encounter (HOSPITAL_BASED_OUTPATIENT_CLINIC_OR_DEPARTMENT_OTHER): Payer: BC Managed Care – PPO | Admitting: Internal Medicine

## 2019-04-01 DIAGNOSIS — E11621 Type 2 diabetes mellitus with foot ulcer: Secondary | ICD-10-CM | POA: Diagnosis not present

## 2019-04-01 LAB — GLUCOSE, CAPILLARY
Glucose-Capillary: 224 mg/dL — ABNORMAL HIGH (ref 70–99)
Glucose-Capillary: 238 mg/dL — ABNORMAL HIGH (ref 70–99)

## 2019-04-01 NOTE — Progress Notes (Signed)
[place], [name] ([id number]) Visit Report for 04/01/2019 Arrival Information Details Patient Name: Date of Service: [place], [name] 04/01/2019 10:00 AM Medical Record [id number] Patient Account Number: [id number] Date of Birth/Sex: Treating RN: [birth year] (33 y.o. M) Primary Care Marciel Offenberger: [name] Other Clinician: [name] Referring Ladine Kiper: Treating Trashaun Streight/Extender:Robson, [name], [name]: 11 Visit Information History Since Last Visit Added or deleted any medications: No Patient Arrived: Ambulatory Any new allergies or adverse reactions: No Arrival Time: 10:30 Had a fall or experienced change in No Accompanied By: self activities of daily living that may affect Transfer Assistance: None risk of falls: Patient Identification Verified: Yes Signs or symptoms of abuse/neglect since last No Secondary Verification Process Yes visito Completed: Hospitalized since last visit: No Patient Requires Transmission-Based No Implantable device outside of the clinic excluding No Precautions: cellular tissue based products placed in the center Patient Has Alerts: No since last visit: Pain Present Now: No Electronic Signature(s) Signed: 04/01/2019 4:06:01 PM By: [name] EMT/HBOT Entered By: [name] on 04/01/2019 11:00:02 -------------------------------------------------------------------------------- Encounter Discharge Information Details Patient Name: Date of Service: [place], [name] 04/01/2019 10:00 AM Medical Record [id number] Patient Account Number: [id number] Date of Birth/Sex: Treating RN: 1986/02/05 (33 y.o. M) Primary Care Itzy Adler: [name] Other Clinician: [name] Referring Mehtab Dolberry: Treating Heavyn Yearsley/Extender:Robson, [name], [name]: 11 Encounter Discharge Information Items Discharge Condition: Stable Ambulatory Status: Ambulatory Discharge Destination:  Home Transportation: Private Auto Accompanied By: self Schedule Follow-up Appointment: Yes Clinical Summary of Care: Patient Declined Electronic Signature(s) Signed: 04/01/2019 4:06:01 PM By: [name] EMT/HBOT Entered By: [name] on 04/01/2019 13:25:26 -------------------------------------------------------------------------------- Patient/Caregiver Education Details Patient Name: Laver, [name] 12/28/2020andnbsp10:00 Date of Service: AM Medical Record [id number] Number: Patient Account Number: [id number] Treating RN: 1985/04/25 (33 y.o. Date of Birth/Gender: M) Other Clinician: [name] Primary Care Treating [name], Turner Daniels Physician: Physician/Extender: Referring Physician: Smith Mince in Treatment: 11 Education Assessment Education Provided To: Patient Education Topics Provided Hyperbaric Oxygenation: Methods: Explain/Verbal Responses: State content correctly Electronic Signature(s) Signed: 04/01/2019 4:06:01 PM By: [name] EMT/HBOT Entered By: [name] on 04/01/2019 13:25:08 -------------------------------------------------------------------------------- Vitals Details Patient Name: Date of Service: [place], [name] 04/01/2019 10:00 AM Medical Record [id number] Patient Account Number: [id number] Date of Birth/Sex: Treating RN: 04-10-1985 (33 y.o. M) Primary Care Li Fragoso: [name] Other Clinician: [name] Referring Magin Balbi: Treating Ashawna Hanback/Extender:Robson, [name], [name]: 11 Vital Signs Time Taken: 10:35 Temperature (F): 98 Height (in): 77 Pulse (bpm): 103 Weight (lbs): 260 Respiratory Rate (breaths/min): 16 Body Mass Index (BMI): 30.8 Blood Pressure (mmHg): 148/79 Capillary Blood Glucose (mg/dl): 224 Reference Range: 80 - 120 mg / dl Electronic Signature(s) Signed: 04/01/2019 4:06:01 PM By: [name] EMT/HBOT Entered By: [name]  on 04/01/2019 11:00:22

## 2019-04-01 NOTE — Progress Notes (Signed)
[place], [name] ([id number]) Visit Report for 04/01/2019 SuperBill Details Patient Name: Date of Service: [place], [name] 04/01/2019 Medical Record [id number] Patient Account Number: [id number] Date of Birth/Sex: Treating RN: 04/25/1985 (33 y.o. 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

## 2019-04-01 NOTE — Progress Notes (Addendum)
[place], [name] ([id number]) Visit Report for 04/01/2019 HBO Details Patient Name: Date of Service: [place], [name] 04/01/2019 10:00 AM Medical Record Number:[id number] Patient Account Number: [id number] Date of Birth/Sex: Treating RN: Jan 29, 1986 (33 y.o. M) Primary Care Elizardo Chilson: Katy Apo Other Clinician: [name] Referring Priyal Musquiz: Treating Tria Noguera/Extender:Robson, Almira Coaster, Chalmers Guest in Treatment: 11 HBO Treatment Course Details Treatment Course Number: 1 Ordering Kiandre Spagnolo: Baltazar Najjar Total Treatments Ordered: 40 HBO Treatment Start Date: 03/12/2019 HBO Indication: Diabetic Ulcer(s) of the Lower Extremity HBO Treatment Details Treatment Number: 11 Patient Type: Outpatient Chamber Type: Monoplace Chamber Serial #: [id number] Treatment Protocol: 2.0 ATA with 90 minutes oxygen, and no air breaks Treatment Details Compression Rate Down: 2.0 psi / minute De-Compression Rate Up: 2.0 psi / minute Air breaks and CompressTx Pressure breathing DecompressDecompress Begins Reached periods Begins Ends (leave unused spaces blank) Chamber Pressure (ATA)1 2 ------2 1 Clock Time (24 hr) 10:43 10:51 - - - - - - 12:21 12:29 Treatment Length: 106 (minutes) Treatment Segments: 4 Vital Signs Capillary Blood Glucose Reference Range: 80 - 120 mg / dl HBO Diabetic Blood Glucose Intervention Range: <131 mg/dl or >287 mg/dl Time Vitals Blood Respiratory Capillary Blood Glucose Pulse Action Type: Pulse: Temperature: Taken: Pressure: Rate: Glucose (mg/dl): Meter #: Oximetry (%) Taken: Pre 10:35 148/79 103 16 98 224 Post 12:31 135/95 81 14 98.4 238 Treatment Response Treatment Toleration: Well Treatment Completion Treatment Completed without Adverse Event Status: Additional Procedure Documentation Tissue Sevierity: Necrosis of bone Aliena Ghrist Notes No concerns with treatment given Physician HBO Attestation: I certify that I supervised this HBO treatment  in accordance with Medicare guidelines. A trained Yes emergency response team is readily available per hospital policies and procedures. Continue HBOT as ordered. Yes Electronic Signature(s) Signed: 04/01/2019 6:10:19 PM By: Baltazar Najjar MD Previous Signature: 04/01/2019 4:06:01 PM Version By: [name] EMT/HBOT Entered By: Baltazar Najjar on 04/01/2019 18:07:50 -------------------------------------------------------------------------------- HBO Safety Checklist Details Patient Name: Date of Service: [place], [name] 04/01/2019 10:00 AM Medical Record Number:[id number] Patient Account Number: [id number] Date of Birth/Sex: Treating RN: 10-31-1985 (33 y.o. M) Primary Care Jariyah Hackley: Katy Apo Other Clinician: [name] Referring Kortlyn Koltz: Treating Maybelle Depaoli/Extender:Robson, Almira Coaster, Chalmers Guest in Treatment: 11 HBO Safety Checklist Items Safety Checklist Consent Form Signed Patient voided / foley secured and emptied When did you last eato 0930 - Ensure Last dose of injectable or oral agent insulin NA Ostomy pouch emptied and vented if applicable All implantable devices assessed, documented and approved NA Intravenous access site secured and place Valuables secured Linens and cotton and cotton/polyester blend (less than 51% polyester) Personal oil-based products / skin lotions / body lotions removed NA Wigs or hairpieces removed NA Smoking or tobacco materials removed Books / newspapers / magazines / loose paper removed Cologne, aftershave, perfume and deodorant removed Jewelry removed (may wrap wedding band) NA Make-up removed Hair care products removed NA Battery operated devices (external) removed NA Heating patches and chemical warmers removed NA Titanium eyewear removed NA Nail polish cured greater than 10 hours NA Casting material cured greater than 10 hours NA Hearing aids removed NA Loose dentures or partials removed NA Prosthetics have  been removed Patient demonstrates correct use of air break device (if applicable) Patient concerns have been addressed Patient grounding bracelet on and cord attached to chamber Specifics for Inpatients (complete in addition to above) Medication sheet sent with patient Intravenous medications needed or due during therapy sent with patient Drainage tubes (e.g. nasogastric tube or chest tube secured and vented) Endotracheal  or Tracheotomy tube secured Cuff deflated of air and inflated with saline Airway suctioned Electronic Signature(s) Signed: 04/01/2019 11:01:06 AM By: [name] EMT/HBOT Entered By: [name] on 04/01/2019 11:01:05

## 2019-04-01 NOTE — Progress Notes (Addendum)
Gelpi, [name] ([id number]) Visit Report for 03/28/2019 Arrival Information Details Patient Name: Date of Service: [name], [name] 03/28/2019 8:00 AM Medical Record [id number] Patient Account Number: [id number] Date of Birth/Sex: Treating RN: Jul 03, 1985 (33 y.o. Jerilynn Mages) Carlene Coria Primary Care Sheldon Amara: [name] Other Clinician: Referring Maks Cavallero: Treating Alizah Sills/Extender:Robson, [name], [name]: 10 Visit Information History Since Last Visit All ordered tests and consults were completed: No Patient Arrived: Ambulatory Added or deleted any medications: No Arrival Time: 08:23 Any new allergies or adverse reactions: No Accompanied By: self Had a fall or experienced change in No Transfer Assistance: None activities of daily living that may affect Patient Identification Verified: Yes risk of falls: Secondary Verification Process Completed: Yes Signs or symptoms of abuse/neglect since last No Patient Requires Transmission-Based No visito Precautions: Hospitalized since last visit: No Patient Has Alerts: No Implantable device outside of the clinic excluding No cellular tissue based products placed in the center since last visit: Has Dressing in Place as Prescribed: Yes Pain Present Now: No Electronic Signature(s) Signed: 03/28/2019 11:24:03 AM By: Carlene Coria RN Entered By: Carlene Coria on 03/28/2019 08:37:10 -------------------------------------------------------------------------------- Encounter Discharge Information Details Patient Name: Date of Service: [name], [name] 03/28/2019 8:00 AM Medical Record Number:[id number] Patient Account Number: [id number] Date of Birth/Sex: Treating RN: [birth year] (33 y.o. Marvis Repress Primary Care Greyden Besecker: [name] Other Clinician: Referring Tonie Elsey: Treating Maize Brittingham/Extender:Robson, [name], [name]: 10 Encounter Discharge Information Items Post  Procedure Vitals Discharge Condition: Stable Temperature (F): 98.4 Ambulatory Status: Ambulatory Pulse (bpm): 90 Discharge Destination: Home Respiratory Rate (breaths/min): 18 Transportation: Private Auto Blood Pressure (mmHg): 127/71 Accompanied By: son Schedule Follow-up Appointment: Yes Clinical Summary of Care: Patient Declined Electronic Signature(s) Signed: 04/01/2019 5:46:04 PM By: Kela Millin Entered By: Kela Millin on 03/28/2019 09:10:13 -------------------------------------------------------------------------------- Lower Extremity Assessment Details Patient Name: Date of Service: [name], [name] 03/28/2019 8:00 AM Medical Record [id number] Patient Account Number: [id number] Date of Birth/Sex: Treating RN: 10/06/1985 (33 y.o. Oval Linsey Primary Care Kavonte Bearse: [name] Other Clinician: Referring Elchanan Bob: Treating Boston Catarino/Extender:Robson, [name], Ashby Dawes Weeks in Treatment: 10 Edema Assessment Assessed: [Left: No] [Right: No] Edema: [Left: Ye] [Right: s] Calf Left: Right: Point of Measurement: 48 cm From Medial Instep 45 cm cm Ankle Left: Right: Point of Measurement: 12 cm From Medial Instep 30 cm cm Electronic Signature(s) Signed: 03/28/2019 11:24:03 AM By: Carlene Coria RN Entered By: Carlene Coria on 03/28/2019 08:38:09 -------------------------------------------------------------------------------- Multi Wound Chart Details Patient Name: Date of Service: [name], [name] 03/28/2019 8:00 AM Medical Record [id number] Patient Account Number: [id number] Date of Birth/Sex: Treating RN: 1985-07-26 (33 y.o. M) Primary Care Ren Grasse: [name] Other Clinician: Referring Shynia Daleo: Treating Caroll Weinheimer/Extender:Robson, [name], Ashby Dawes Weeks in Treatment: 10 Vital Signs Height(in): 77 Pulse(bpm): 90 Weight(lbs): 260 Blood Pressure(mmHg): 127/71 Body Mass Index(BMI): 31 Temperature(F):  98.4 Respiratory 18 Rate(breaths/min): Photos: [1:No Photos] [N/A:N/A] Wound Location: [1:Left Metatarsal head fifth N/A] Wounding Event: [1:Trauma] [N/A:N/A] Primary Etiology: [1:Diabetic Wound/Ulcer of the N/A Lower Extremity] Comorbid History: [1:Type II Diabetes] [N/A:N/A] Date Acquired: [1:08/03/2018] [N/A:N/A] Weeks of Treatment: [1:10] [N/A:N/A] Wound Status: [1:Open] [N/A:N/A] Measurements L x W x D 2.1x2.5x0.4 [N/A:N/A] (cm) Area (cm) : [1:4.123] [N/A:N/A] Volume (cm) : [1:1.649] [N/A:N/A] % Reduction in Area: [1:-40.40%] [N/A:N/A] % Reduction in Volume: 29.80% [N/A:N/A] Classification: [1:Grade 2] [N/A:N/A] Exudate Amount: [1:Medium] [N/A:N/A] Exudate Type: [1:Serosanguineous] [N/A:N/A] Exudate Color: [1:red, brown] [N/A:N/A] Wound Margin: [1:Well defined, not attached N/A] Granulation Amount: [1:Large (67-100%)] [N/A:N/A] Granulation Quality: [1:Pink] [N/A:N/A] Necrotic  Amount: [1:None Present (0%)] [N/A:N/A] Exposed Structures: [1:Fat Layer (Subcutaneous N/A Tissue) Exposed: Yes Fascia: No Tendon: No Muscle: No Joint: No Bone: No] Epithelialization: [1:Small (1-33%)] [N/A:N/A] Debridement: [1:Debridement - Excisional N/A] Pre-procedure [1:08:50] [N/A:N/A] Verification/Time Out Taken: Pain Control: [1:Lidocaine 4% Topical Solution] [N/A:N/A] Tissue Debrided: [1:Callus, Subcutaneous] [N/A:N/A] Level: [1:Skin/Subcutaneous Tissue N/A] Debridement Area (sq cm):1 [N/A:N/A] Instrument: [1:Curette] [N/A:N/A] Bleeding: [1:Minimum] [N/A:N/A] Hemostasis Achieved: [1:Pressure] [N/A:N/A] Procedural Pain: [1:0] [N/A:N/A] Post Procedural Pain: [1:0] [N/A:N/A] Debridement Treatment Procedure was tolerated [N/A:N/A] Response: [1:well] Post Debridement [1:2.1x2.5x0.4] [N/A:N/A] Measurements L x W x D (cm) Post Debridement [1:1.649] [N/A:N/A] Volume: (cm) Procedures Performed: [1:Debridement] [N/A:N/A] Treatment Notes Electronic Signature(s) Signed: 03/28/2019 12:25:31  PM By: Baltazar Najjar MD Entered By: Baltazar Najjar on 03/28/2019 09:01:39 -------------------------------------------------------------------------------- Multi-Disciplinary Care Plan Details Patient Name: Date of Service: [name], [name] 03/28/2019 8:00 AM Medical Record Number:[id number] Patient Account Number: [id number] Date of Birth/Sex: Treating RN: 22-Jul-1985 (33 y.o. [name] Primary Care Yaritzy Huser: Katy Apo Other Clinician: Referring Hollie [name]: Treating Hassie Mandt/Extender:Robson, Almira Coaster, Chalmers Guest in Treatment: 10 Active Inactive Wound/Skin Impairment Nursing Diagnoses: Impaired tissue integrity Knowledge deficit related to ulceration/compromised skin integrity Goals: Patient/caregiver will verbalize understanding of skin care regimen Date Initiated: 01/11/2019 Target Resolution Date: 05/03/2019 Goal Status: Active Ulcer/skin breakdown will have a volume reduction of 30% by week 4 Date Initiated: 01/11/2019 Date Inactivated: 02/26/2019 Target Resolution Date: 02/08/2019 Goal Status: Unmet Unmet Reason: comorbities Ulcer/skin breakdown will have a volume reduction of 50% by week 8 Date Initiated: 02/26/2019 Date Inactivated: 03/28/2019 Target Resolution Date: 04/12/2019 Unmet Reason: no change Goal Status: Unmet in wound measurement. see wound measurment. Interventions: Assess patient/caregiver ability to obtain necessary supplies Assess patient/caregiver ability to perform ulcer/skin care regimen upon admission and as needed Assess ulceration(s) every visit Provide education on ulcer and skin care Notes: Electronic Signature(s) Signed: 03/28/2019 11:12:59 AM By: Shawn Stall Entered By: Shawn Stall on 03/28/2019 08:05:02 -------------------------------------------------------------------------------- Pain Assessment Details Patient Name: Date of Service: [name], [name] 03/28/2019 8:00 AM Medical Record Number:[id number] Patient Account  Number: [id number] Date of Birth/Sex: Treating RN: 1986/03/21 (33 y.o. [name] Primary Care Kelcey Korus: Katy Apo Other Clinician: Referring Kwinton Maahs: Treating Anav Lammert/Extender:Robson, Almira Coaster, Deirdre Peer Weeks in Treatment: 10 Active Problems Location of Pain Severity and Description of Pain Patient Has Paino No Site Locations Pain Management and Medication Current Pain Management: Electronic Signature(s) Signed: 03/28/2019 11:24:03 AM By: Yevonne Pax RN Entered By: Yevonne Pax on 03/28/2019 08:37:47 -------------------------------------------------------------------------------- Patient/Caregiver Education Details Patient Name: Date of Service: Sluka, [name] 12/24/2020andnbsp8:00 AM Medical Record Number:[id number] Patient Account Number: [id number] Date of Birth/Gender: Treating RN: 10-09-1985 (33 y.o. [name] Primary Care Physician: Katy Apo Other Clinician: Referring Physician: Treating Physician/Extender:Robson, Almira Coaster, Chalmers Guest in Treatment: 10 Education Assessment Education Provided To: Patient Education Topics Provided Offloading: Handouts: What is Offloadingo Methods: Explain/Verbal, Printed Responses: Reinforcements needed Electronic Signature(s) Signed: 03/28/2019 11:12:59 AM By: Shawn Stall Entered By: Shawn Stall on 03/28/2019 08:05:16 -------------------------------------------------------------------------------- Wound Assessment Details Patient Name: Date of Service: [name], [name] 03/28/2019 8:00 AM Medical Record Number:[id number] Patient Account Number: [id number] Date of Birth/Sex: Treating RN: 1986-03-25 (33 y.o. [name] Primary Care Rethel Sebek: Katy Apo Other Clinician: Referring Antiono Ettinger: Treating Jazzma Neidhardt/Extender:Robson, Almira Coaster, Deirdre Peer Weeks in Treatment: 10 Wound Status Wound Number: 1 Primary Diabetic Wound/Ulcer of the Lower Etiology: Extremity Wound  Location: Left Metatarsal head fifth Wound Status: Open Wounding Event: Trauma Comorbid Type II Diabetes Date Acquired: 08/03/2018 History: Weeks Of Treatment: 10 Clustered Wound:  No Photos Wound Measurements Length: (cm) 2.1 Width: (cm) 2.5 Depth: (cm) 0.4 Area: (cm) 4.123 Volume: (cm) 1.649 Wound Description Classification: Grade 2 Wound Margin: Well defined, not attached Exudate Amount: Medium Exudate Type: Serosanguineous Exudate Color: red, brown Wound Bed Granulation Amount: Large (67-100%) Granulation Quality: Pink Necrotic Amount: None Present (0%) Foul Odor After Cleansing: No Slough/Fibrino No Exposed Structure Fascia Exposed: No Fat Layer (Subcutaneous Tissue) Exposed: Yes Tendon Exposed: No Muscle Exposed: No Joint Exposed: No Bone Exposed: No % Reduction in Area: -40.4% % Reduction in Volume: 29.8% Epithelialization: Small (1-33%) Tunneling: No Undermining: No Treatment Notes Wound #1 (Left Metatarsal head fifth) 1. Cleanse With Wound Cleanser 2. Periwound Care Skin Prep 3. Primary Dressing Applied Calcium Alginate Ag 4. Secondary Dressing Dry Gauze Roll Gauze 5. Secured With Tape 7. Footwear/Offloading device applied Felt/Foam Wedge shoe Electronic Signature(s) Signed: 04/02/2019 3:43:24 PM By: Benjaman KindlerJones, Dedrick EMT/HBOT Signed: 04/02/2019 5:43:27 PM By: Yevonne PaxEpps, Carrie RN Previous Signature: 03/28/2019 11:24:03 AM Version By: Yevonne PaxEpps, Carrie RN Entered By: Benjaman KindlerJones, Dedrick on 04/02/2019 14:56:10 -------------------------------------------------------------------------------- Vitals Details Patient Name: Date of Service: [place], [name] 03/28/2019 8:00 AM Medical Record Number:[id number] Patient Account Number: [id number] Date of Birth/Sex: Treating RN: 1986-01-07 (33 y.o. Lina SarM) Epps, [place] Primary Care Humaira Sculley: Katy ApoPolite, Ronald D Other Clinician: Referring Yaron Grasse: Treating Tycen Dockter/Extender:Robson, Almira CoasterMichael [name], Deirdre Peeronald D Weeks in Treatment:  10 Vital Signs Time Taken: 08:37 Temperature (F): 98.4 Height (in): 77 Pulse (bpm): 90 Weight (lbs): 260 Respiratory Rate (breaths/min): 18 Body Mass Index (BMI): 30.8 Blood Pressure (mmHg): 127/71 Reference Range: 80 - 120 mg / dl Electronic Signature(s) Signed: 03/28/2019 11:24:03 AM By: Yevonne PaxEpps, Carrie RN Entered By: Yevonne PaxEpps, Carrie on 03/28/2019 08:37:38

## 2019-04-02 ENCOUNTER — Other Ambulatory Visit: Payer: Self-pay

## 2019-04-02 ENCOUNTER — Encounter (HOSPITAL_BASED_OUTPATIENT_CLINIC_OR_DEPARTMENT_OTHER): Payer: BC Managed Care – PPO | Admitting: Internal Medicine

## 2019-04-02 DIAGNOSIS — E11621 Type 2 diabetes mellitus with foot ulcer: Secondary | ICD-10-CM | POA: Diagnosis not present

## 2019-04-02 LAB — GLUCOSE, CAPILLARY
Glucose-Capillary: 181 mg/dL — ABNORMAL HIGH (ref 70–99)
Glucose-Capillary: 162 mg/dL — ABNORMAL HIGH (ref 70–99)

## 2019-04-02 NOTE — Progress Notes (Signed)
Supple, [name] ([id number]) Visit Report for 04/02/2019 Arrival Information Details Patient Name: Date of Service: Mathisen, [name] 04/02/2019 10:00 AM Medical Record [id number] Patient Account Number: [id number] Date of Birth/Sex: Treating RN: July 20, 1985 (33 y.o. M) Primary Care Lacey Wallman: [name] Other Clinician: [name] Referring Mitul Hallowell: Treating Alphonza Tramell/Extender:Robson, [name], [name]: 11 Visit Information History Since Last Visit Added or deleted any medications: No Patient Arrived: Ambulatory Any new allergies or adverse reactions: No Arrival Time: 10:10 Had a fall or experienced change in No Accompanied By: self activities of daily living that may affect Transfer Assistance: None risk of falls: Patient Identification Verified: Yes Signs or symptoms of abuse/neglect since last No Secondary Verification Process Yes visito Completed: Hospitalized since last visit: No Patient Requires Transmission-Based No Implantable device outside of the clinic excluding No Precautions: cellular tissue based products placed in the center Patient Has Alerts: No since last visit: Pain Present Now: No Electronic Signature(s) Signed: 04/02/2019 3:43:24 PM By: [name] EMT/HBOT Entered By: [name] on 04/02/2019 11:10:32 -------------------------------------------------------------------------------- Encounter Discharge Information Details Patient Name: Date of Service: [name] 04/02/2019 10:00 AM Medical Record [id number] Patient Account Number: [id number] Date of Birth/Sex: Treating RN: 1986/01/06 (33 y.o. M) Primary Care Izayah Miner: [name] Other Clinician: [name] Referring Senay Sistrunk: Treating Analie Katzman/Extender:Robson, [name], [name]: 11 Encounter Discharge Information Items Discharge Condition: Stable Ambulatory Status: Ambulatory Discharge Destination:  Home Transportation: Private Auto Accompanied By: self Schedule Follow-up Appointment: Yes Clinical Summary of Care: Patient Declined Electronic Signature(s) Signed: 04/02/2019 3:43:24 PM By: [name] EMT/HBOT Entered By: [name] on 04/02/2019 13:29:21 -------------------------------------------------------------------------------- Patient/Caregiver Education Details Patient Name: [name] 12/29/2020andnbsp10:00 Date of Service: AM Medical Record [id number] Number: Patient Account Number: [id number] Treating RN: 17-Jan-1986 (33 y.o. Date of Birth/Gender: M) Other Clinician: [name] Primary Care Treating [name], Turner Daniels Physician: Physician/Extender: Referring Physician: Smith Mince in Treatment: 11 Education Assessment Education Provided To: Patient Education Topics Provided Hyperbaric Oxygenation: Methods: Explain/Verbal Responses: State content correctly Electronic Signature(s) Signed: 04/02/2019 3:43:24 PM By: [name] EMT/HBOT Entered By: [name] on 04/02/2019 13:29:09 -------------------------------------------------------------------------------- Vitals Details Patient Name: Date of Service: [name] 04/02/2019 10:00 AM Medical Record [id number] Patient Account Number: [id number] Date of Birth/Sex: Treating RN: [birth year] (33 y.o. M) Primary Care Samanthajo Payano: [name] Other Clinician: [name] Referring Jean Alejos: Treating Daymien Goth/Extender:Robson, [name], [name]: 11 Vital Signs Time Taken: 10:15 Temperature (F): 98.3 Height (in): 77 Pulse (bpm): 61 Weight (lbs): 260 Respiratory Rate (breaths/min): 16 Body Mass Index (BMI): 30.8 Blood Pressure (mmHg): 107/59 Capillary Blood Glucose (mg/dl): 181 Reference Range: 80 - 120 mg / dl Electronic Signature(s) Signed: 04/02/2019 3:43:24 PM By: [name] EMT/HBOT Entered By: [name] on 04/02/2019 11:10:51

## 2019-04-02 NOTE — Progress Notes (Addendum)
[name], [name] ([id number]) Visit Report for 04/02/2019 HBO Details Patient Name: Date of Service: [name], [name] 04/02/2019 10:00 AM Medical Record [id number] Patient Account Number: [id number] Date of Birth/Sex: Treating RN: 1986/02/05 (33 y.o. M) Primary Care [name]: [name] Other Clinician: [name] Referring Ilah Boule: Treating Tazaria Dlugosz/Extender:Robson, [name], [name]: 11 HBO Treatment Course Details Treatment Course Number: 1 Ordering Maiah Sinning: [name] Total Treatments Ordered: 40 HBO Treatment Start Date: 03/12/2019 HBO Indication: Diabetic Ulcer(s) of the Lower Extremity HBO Treatment Details Treatment Number: 12 Patient Type: Outpatient Chamber Type: Monoplace Chamber Serial #: [id number] Treatment Protocol: 2.0 ATA with 90 minutes oxygen, and no air breaks Treatment Details Compression Rate Down: 2.0 psi / minute De-Compression Rate Up: 2.0 psi / minute Air breaks and CompressTx Pressure breathing DecompressDecompress Begins Reached periods Begins Ends (leave unused spaces blank) Chamber Pressure (ATA)1 2 ------2 1 Clock Time (24 hr) 10:29 10:37 - - - - - - 12:07 12:15 Treatment Length: 106 (minutes) Treatment Segments: 4 Vital Signs Capillary Blood Glucose Reference Range: 80 - 120 mg / dl HBO Diabetic Blood Glucose Intervention Range: <131 mg/dl or >249 mg/dl Time Vitals Blood Respiratory Capillary Blood Glucose Pulse Action Type: Pulse: Temperature: Taken: Pressure: Rate: Glucose (mg/dl): Meter #: Oximetry (%) Taken: Pre 10:15 107/59 61 16 98.3 181 Post 12:17 155/98 78 15 98 162 Treatment Response Treatment Toleration: Well Treatment Completion Treatment Completed without Adverse Event Status: Additional Procedure Documentation Tissue Sevierity: Necrosis of bone [place] Notes No concerns with treatment given Physician HBO Attestation: I certify that I supervised this HBO treatment  in accordance with Medicare guidelines. A trained Yes emergency response team is readily available per hospital policies and procedures. Continue HBOT as ordered. Yes Electronic Signature(s) Signed: 04/02/2019 6:15:30 PM By: [name] MD Previous Signature: 04/02/2019 3:43:24 PM Version By: [name] EMT/HBOT Entered By: [name] on 04/02/2019 17:44:01 -------------------------------------------------------------------------------- HBO Safety Checklist Details Patient Name: Date of Service: [name], [name] 04/02/2019 10:00 AM Medical Record [id number] Patient Account Number: [id number] Date of Birth/Sex: Treating RN: 08-03-1985 (33 y.o. M) Primary Care Areta Terwilliger: [name] Other Clinician: [name] Referring Syann Cupples: Treating Cheryll Keisler/Extender:Robson, [name], [name]: 11 HBO Safety Checklist Items Safety Checklist Consent Form Signed Patient voided / foley secured and emptied When did you last eato 0900 - ensure Last dose of injectable or oral agent insulin NA Ostomy pouch emptied and vented if applicable All implantable devices assessed, documented and approved NA Intravenous access site secured and place Valuables secured Linens and cotton and cotton/polyester blend (less than 51% polyester) Personal oil-based products / skin lotions / body lotions removed NA Wigs or hairpieces removed NA Smoking or tobacco materials removed Books / newspapers / magazines / loose paper removed Cologne, aftershave, perfume and deodorant removed Jewelry removed (may wrap wedding band) NA Make-up removed Hair care products removed NA Battery operated devices (external) removed NA Heating patches and chemical warmers removed NA Titanium eyewear removed NA Nail polish cured greater than 10 hours NA Casting material cured greater than 10 hours NA Hearing aids removed NA Loose dentures or partials removed NA Prosthetics have  been removed Patient demonstrates correct use of air break device (if applicable) Patient concerns have been addressed Patient grounding bracelet on and cord attached to chamber Specifics for Inpatients (complete in addition to above) Medication sheet sent with patient Intravenous medications needed or due during therapy sent with patient Drainage tubes (e.g. nasogastric tube or chest tube secured and vented) Endotracheal  or Tracheotomy tube secured Cuff deflated of air and inflated with saline Airway suctioned Electronic Signature(s) Signed: 04/02/2019 11:11:37 AM By: [name] EMT/HBOT Entered By: [name] on 04/02/2019 11:11:36

## 2019-04-02 NOTE — Progress Notes (Signed)
Dickie, [name] ([id number]) Visit Report for 04/02/2019 SuperBill Details Patient Name: Date of Service: [name], [name] 04/02/2019 Medical Record [id number] Patient Account Number: [id number] Date of Birth/Sex: Treating RN: May 12, 1985 (33 y.o. 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

## 2019-04-03 ENCOUNTER — Encounter (HOSPITAL_BASED_OUTPATIENT_CLINIC_OR_DEPARTMENT_OTHER): Payer: BC Managed Care – PPO | Admitting: Internal Medicine

## 2019-04-03 ENCOUNTER — Other Ambulatory Visit: Payer: Self-pay

## 2019-04-03 DIAGNOSIS — E11621 Type 2 diabetes mellitus with foot ulcer: Secondary | ICD-10-CM | POA: Diagnosis not present

## 2019-04-03 LAB — GLUCOSE, CAPILLARY
Glucose-Capillary: 153 mg/dL — ABNORMAL HIGH (ref 70–99)
Glucose-Capillary: 157 mg/dL — ABNORMAL HIGH (ref 70–99)

## 2019-04-03 NOTE — Progress Notes (Signed)
[name], Max ([id number]) Visit Report for 04/03/2019 SuperBill Details Patient Name: Date of Service: [name], Max 04/03/2019 Medical Record [id number] Patient Account Number: [id number] Date of Birth/Sex: Treating RN: September 07, 1985 (33 y.o. 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

## 2019-04-03 NOTE — Progress Notes (Addendum)
[name], [name] ([id number]) Visit Report for 04/03/2019 HBO Details Patient Name: Date of Service: [name], [name] 04/03/2019 10:00 AM Medical Record [id number] Patient Account Number: [id number] Date of Birth/Sex: Treating RN: Nov 02, 1985 (33 y.o. M) Primary Care Kemond Amorin: [name] Other Clinician: [name] Referring Chloey Ricard: Treating Olando Willems/Extender:Madduri, Jennelle Human, [name]: 11 HBO Treatment Course Details Treatment Course Number: 1 Ordering Auden Tatar: [name] Total Treatments Ordered: 40 HBO Treatment Start Date: 03/12/2019 HBO Indication: Diabetic Ulcer(s) of the Lower Extremity HBO Treatment Details Treatment Number: 13 Patient Type: Outpatient Chamber Type: Monoplace Chamber Serial #: [id number] Treatment Protocol: 2.0 ATA with 90 minutes oxygen, and no air breaks Treatment Details Compression Rate Down: 2.0 psi / minute De-Compression Rate Up: 2.0 psi / minute Air breaks and CompressTx Pressure breathing DecompressDecompress Begins Reached periods Begins Ends (leave unused spaces blank) Chamber Pressure (ATA)1 2 ------2 1 Clock Time (24 hr) 10:25 10:33 - - - - - - 12:03 12:11 Treatment Length: 106 (minutes) Treatment Segments: 4 Vital Signs Capillary Blood Glucose Reference Range: 80 - 120 mg / dl HBO Diabetic Blood Glucose Intervention Range: <131 mg/dl or >249 mg/dl Time Vitals Blood Respiratory Capillary Blood Glucose Pulse Action Type: Pulse: Temperature: Taken: Pressure: Rate: Glucose (mg/dl): Meter #: Oximetry (%) Taken: Pre 10:10 130/82 102 16 97.8 153 Post 12:13 147/93 78 14 97.5 157 Treatment Response Treatment Toleration: Well Treatment Completion Treatment Completed without Adverse Event Status: Additional Procedure Documentation Tissue Sevierity: Necrosis of bone Electronic Signature(s) Signed: 04/03/2019 3:39:15 PM By: [name] EMT/HBOT Signed: 04/03/2019 5:10:58 PM By: Tobi Bastos MD, MBA Entered By: [name] on 04/03/2019 12:37:51 -------------------------------------------------------------------------------- HBO Safety Checklist Details Patient Name: Date of Service: [name], [name] 04/03/2019 10:00 AM Medical Record [id number] Patient Account Number: [id number] Date of Birth/Sex: Treating RN: 03/24/1986 (33 y.o. M) Primary Care Laneah Luft: [name] Other Clinician: [name] Referring Bates Collington: Treating Keirstyn Aydt/Extender:Madduri, Jennelle Human, [name]: 11 HBO Safety Checklist Items Safety Checklist Consent Form Signed Patient voided / foley secured and emptied When did you last eato 0900 - Ensure Last dose of injectable or oral agent insulin NA Ostomy pouch emptied and vented if applicable All implantable devices assessed, documented and approved NA Intravenous access site secured and place Valuables secured Linens and cotton and cotton/polyester blend (less than 51% polyester) Personal oil-based products / skin lotions / body lotions removed Wigs or hairpieces removed NA Smoking or tobacco materials removed NA Books / newspapers / magazines / loose paper removed Cologne, aftershave, perfume and deodorant removed Jewelry removed (may wrap wedding band) Make-up removed Hair care products removed NA Battery operated devices (external) removed NA Heating patches and chemical warmers removed NA Titanium eyewear removed NA Nail polish cured greater than 10 hours NA Casting material cured greater than 10 hours NA Hearing aids removed NA Loose dentures or partials removed NA Prosthetics have been removed Patient demonstrates correct use of air break device (if applicable) Patient concerns have been addressed Patient grounding bracelet on and cord attached to chamber Specifics for Inpatients (complete in addition to above) Medication sheet sent with patient Intravenous medications needed or due  during therapy sent with patient Drainage tubes (e.g. nasogastric tube or chest tube secured and vented) Endotracheal or Tracheotomy tube secured Cuff deflated of air and inflated with saline Airway suctioned Electronic Signature(s) Signed: 04/03/2019 10:43:57 AM By: [name] EMT/HBOT Entered By: [name] on 04/03/2019 10:43:55

## 2019-04-03 NOTE — Progress Notes (Signed)
[name], Max ([id number]) Visit Report for 04/03/2019 Arrival Information Details Patient Name: Date of Service: [name], Max 04/03/2019 10:00 AM Medical Record [id number] Patient Account Number: [id number] Date of Birth/Sex: Treating RN: [birth year] (33 y.o. M) Primary Care Axton Cihlar: [name] Other Clinician: [name] Referring Kadrian Partch: Treating Diantha Paxson/Extender:Madduri, Jennelle Human, [name]: 11 Visit Information History Since Last Visit Added or deleted any medications: No Patient Arrived: Ambulatory Any new allergies or adverse reactions: No Arrival Time: 10:05 Had a fall or experienced change in No Accompanied By: self activities of daily living that may affect Transfer Assistance: None risk of falls: Patient Identification Verified: Yes Signs or symptoms of abuse/neglect since last No Secondary Verification Process Yes visito Completed: Hospitalized since last visit: No Patient Requires Transmission-Based No Implantable device outside of the clinic excluding No Precautions: cellular tissue based products placed in the center Patient Has Alerts: No since last visit: Pain Present Now: No Electronic Signature(s) Signed: 04/03/2019 3:39:15 PM By: [name] EMT/HBOT Entered By: [name] on 04/03/2019 10:42:52 -------------------------------------------------------------------------------- Encounter Discharge Information Details Patient Name: Date of Service: [name], Max 04/03/2019 10:00 AM Medical Record Number:[id number] Patient Account Number: [id number] Date of Birth/Sex: Treating RN: 05-04-1985 (33 y.o. M) Primary Care Jaskiran Pata: [name] Other Clinician: [name] Referring Dempsy Damiano: Treating Mathayus Stanbery/Extender:Madduri, Jennelle Human, [name]: 11 Encounter Discharge Information Items Discharge Condition: Stable Ambulatory Status: Ambulatory Discharge Destination:  Home Transportation: Private Auto Accompanied By: self Schedule Follow-up Appointment: Yes Clinical Summary of Care: Patient Declined Electronic Signature(s) Signed: 04/03/2019 3:39:15 PM By: [name] EMT/HBOT Entered By: [name] on 04/03/2019 12:38:27 -------------------------------------------------------------------------------- Patient/Caregiver Education Details Patient Name: [name], Max 12/30/2020andnbsp10:00 Date of Service: AM Medical Record [id number] Number: Patient Account Number: [id number] Treating RN: [birth year] (33 y.o. Date of Birth/Gender: M) Other Clinician: [name] Primary Care Treating [name], Levora Angel Physician: Physician/Extender: Referring Physician: Smith Mince in Treatment: 11 Education Assessment Education Provided To: Patient Education Topics Provided Hyperbaric Oxygenation: Methods: Explain/Verbal Responses: State content correctly Electronic Signature(s) Signed: 04/03/2019 3:39:15 PM By: [name] EMT/HBOT Entered By: [name] on 04/03/2019 12:38:15 -------------------------------------------------------------------------------- Vitals Details Patient Name: Date of Service: [name], Max 04/03/2019 10:00 AM Medical Record [id number] Patient Account Number: [id number] Date of Birth/Sex: Treating RN: 1985/09/08 (33 y.o. M) Primary Care Benjimin Hadden: [name] Other Clinician: [name] Referring Deontrey Massi: Treating Revonda Menter/Extender:Madduri, Jennelle Human, Ashby Dawes Weeks in Treatment: 11 Vital Signs Time Taken: 10:10 Temperature (F): 97.8 Height (in): 77 Pulse (bpm): 102 Weight (lbs): 260 Respiratory Rate (breaths/min): 16 Body Mass Index (BMI): 30.8 Blood Pressure (mmHg): 130/82 Capillary Blood Glucose (mg/dl): 153 Reference Range: 80 - 120 mg / dl Electronic Signature(s) Signed: 04/03/2019 3:39:15 PM By: [name] EMT/HBOT Entered By: [name] on 04/03/2019 10:43:10

## 2019-04-04 ENCOUNTER — Encounter (HOSPITAL_BASED_OUTPATIENT_CLINIC_OR_DEPARTMENT_OTHER): Payer: BC Managed Care – PPO | Admitting: Internal Medicine

## 2019-04-04 ENCOUNTER — Other Ambulatory Visit: Payer: Self-pay

## 2019-04-04 DIAGNOSIS — E11621 Type 2 diabetes mellitus with foot ulcer: Secondary | ICD-10-CM | POA: Diagnosis not present

## 2019-04-04 LAB — GLUCOSE, CAPILLARY
Glucose-Capillary: 198 mg/dL — ABNORMAL HIGH (ref 70–99)
Glucose-Capillary: 141 mg/dL — ABNORMAL HIGH (ref 70–99)

## 2019-04-04 NOTE — Progress Notes (Addendum)
[place], [name] ([id number]) Visit Report for 04/04/2019 HBO Details Patient Name: Date of Service: [place], [name] 04/04/2019 10:00 AM Medical Record Number:[id number] Patient Account Number: [id number] Date of Birth/Sex: Treating RN: Aug 03, 1985 (33 y.o. M) Primary Care Chantella Creech: Katy Apo Other Clinician: [name] Referring Ashawna Hanback: Treating Sonny Anthes/Extender:Robson, Almira Coaster, Chalmers Guest in Treatment: 11 HBO Treatment Course Details Treatment Course Number: 1 Ordering Basya Casavant: Baltazar Najjar Total Treatments Ordered: 40 HBO Treatment Start Date: 03/12/2019 HBO Indication: Diabetic Ulcer(s) of the Lower Extremity HBO Treatment Details Treatment Number: 14 Patient Type: Outpatient Chamber Type: Monoplace Chamber Serial #: [id number] Treatment Protocol: 2.0 ATA with 90 minutes oxygen, and no air breaks Treatment Details Compression Rate Down: 2.0 psi / minute De-Compression Rate Up: 2.0 psi / minute Air breaks and CompressTx Pressure breathing DecompressDecompress Begins Reached periods Begins Ends (leave unused spaces blank) Chamber Pressure (ATA)1 2 ------2 1 Clock Time (24 hr) 09:31 09:39 - - - - - - 11:09 11:17 Treatment Length: 106 (minutes) Treatment Segments: 4 Vital Signs Capillary Blood Glucose Reference Range: 80 - 120 mg / dl HBO Diabetic Blood Glucose Intervention Range: <131 mg/dl or >630 mg/dl Time Vitals Blood Respiratory Capillary Blood Glucose Pulse Action Type: Pulse: Temperature: Taken: Pressure: Rate: Glucose (mg/dl): Meter #: Oximetry (%) Taken: Pre 09:15 133/71 110 18 97.5 198 Post 11:20 128/98 80 16 97.4 141 Treatment Response Treatment Toleration: Well Treatment Completion Treatment Completed without Adverse Event Status: Additional Procedure Documentation Tissue Sevierity: Necrosis of bone Quin[place] Boy Notes No concerns for treatment given. Patient was also seen for wound care evaluation Physician HBO  Attestation: I certify that I supervised this HBO treatment in accordance with Medicare guidelines. A trained Yes emergency response team is readily available per hospital policies and procedures. Continue HBOT as ordered. Yes Electronic Signature(s) Signed: 04/04/2019 2:35:12 PM By: Baltazar Najjar MD Previous Signature: 04/04/2019 12:26:55 PM Version By: [name] EMT/HBOT Entered By: Baltazar Najjar on 04/04/2019 13:06:45 -------------------------------------------------------------------------------- HBO Safety Checklist Details Patient Name: Date of Service: [place], [name] 04/04/2019 10:00 AM Medical Record Number:[id number] Patient Account Number: [id number] Date of Birth/Sex: Treating RN: 03/12/1986 (33 y.o. M) Primary Care Mandi Mattioli: Katy Apo Other Clinician: [name] Referring Taraneh Metheney: Treating Zaria Taha/Extender:Robson, Almira Coaster, Chalmers Guest in Treatment: 11 HBO Safety Checklist Items Safety Checklist Consent Form Signed Patient voided / foley secured and emptied When did you last eato 0800 - ensure Last dose of injectable or oral agent insulin NA Ostomy pouch emptied and vented if applicable All implantable devices assessed, documented and approved NA Intravenous access site secured and place Valuables secured Linens and cotton and cotton/polyester blend (less than 51% polyester) Personal oil-based products / skin lotions / body lotions removed NA Wigs or hairpieces removed NA Smoking or tobacco materials removed Books / newspapers / magazines / loose paper removed Cologne, aftershave, perfume and deodorant removed Jewelry removed (may wrap wedding band) NA Make-up removed Hair care products removed NA Battery operated devices (external) removed NA Heating patches and chemical warmers removed NA Titanium eyewear removed NA Nail polish cured greater than 10 hours NA Casting material cured greater than 10 hours NA Hearing aids  removed NA Loose dentures or partials removed NA Prosthetics have been removed Patient demonstrates correct use of air break device (if applicable) Patient concerns have been addressed Patient grounding bracelet on and cord attached to chamber Specifics for Inpatients (complete in addition to above) Medication sheet sent with patient Intravenous medications needed or due during therapy sent with patient Drainage tubes (e.g. nasogastric  tube or chest tube secured and vented) Endotracheal or Tracheotomy tube secured Cuff deflated of air and inflated with saline Airway suctioned Electronic Signature(s) Signed: 04/04/2019 10:27:15 AM By: [name] EMT/HBOT Entered By: [name] on 04/04/2019 10:27:14

## 2019-04-04 NOTE — Progress Notes (Signed)
[name], Max ([id number]) Visit Report for 04/04/2019 Debridement Details Patient Name: Date of Service: [name], Max 04/04/2019 8:00 AM Medical Record [id number] Patient Account Number: [id number] Date of Birth/Sex: [birth year] (33 y.o. M) Treating RN: Primary Care Provider: Kandice [name] Other Clinician: Referring Provider: Treating Provider/Extender:[name], [name], [name] in Treatment: 11 Debridement Performed for Wound #1 Left Metatarsal head fifth Assessment: Performed By: Physician [name], MD Debridement Type: Debridement Severity of Tissue Pre Fat layer exposed Debridement: Level of Consciousness (Pre- Awake and Alert procedure): Pre-procedure Verification/Time Out Taken: Yes - 08:38 Start Time: 08:39 Pain Control: Lidocaine 4% Topical Solution Total Area Debrided (L x W): 2.5 (cm) x 3 (cm) = 7.5 (cm) Tissue and other material Viable, Non-Viable, Slough, Subcutaneous, Skin: Dermis , Fibrin/Exudate, Slough debrided: Level: Skin/Subcutaneous Tissue Debridement Description: Excisional Instrument: Curette Bleeding: Moderate Hemostasis Achieved: Silver Nitrate End Time: 08:45 Procedural Pain: 0 Post Procedural Pain: 0 Response to Treatment: Procedure was tolerated well Level of Consciousness Awake and Alert (Post-procedure): Post Debridement Measurements of Total Wound Length: (cm) 2 Width: (cm) 2.5 Depth: (cm) 0.4 Volume: (cm) 1.571 Character of Wound/Ulcer Post Improved Debridement: Severity of Tissue Post Debridement: Fat layer exposed Post Procedure Diagnosis Same as Pre-procedure Electronic Signature(s) Signed: 04/04/2019 2:35:12 PM By: [name] MD Entered By: [name] on 04/04/2019 09:19:19 -------------------------------------------------------------------------------- HPI Details Patient Name: Date of Service: [name], Max 04/04/2019 8:00 AM Medical Record [id number] Patient Account  Number: [id number] Date of Birth/Sex: Treating RN: 02-11-1986 (33 y.o. 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 He comes in for review of a wound over the plantar fifth metatarsal head extending into the lateral part of the foot. He was followed for this previously by his podiatrist Dr. Cornelius Moras. As the patient tells his story he went to see podiatry first for a swelling he developed on the lateral part of his fifth metatarsal head in May. He states this was "open" by podiatry and the area closed. He was followed up in June and it was again opened callus removed and it closed promptly. There were plans being made for surgery on the fifth metatarsal head in June however his blood sugar was apparently too high for anesthesia. Apparently the area was debrided and opened again in June and it is never closed since. Looking over the records from podiatry I am really not able to follow this. It was clear when he was first seen it was before 5/14 at that point he already had a wound. By 5/17 the ulcer was resolved. I do not see anything about a procedure. On 5/28 noted to have pre-ulcerative moderate keratosis. X-ray noted 1/5 contracted toe and tailor's bunion and metatarsal deformity. On a visit date on 09/28/2018 the dorsal part of the left foot it healed and resolved. There was concern about swelling in his lower extremity he was sent to the ER.. As far as I can tell he was seen in the ER on 7/12 with an ulcer on his left foot. A DVT rule out of the left leg was negative. I do not think I have complete records from podiatry but I am not able to verify the procedures this patient states he had. He states after the last procedure the  wound has never closed although I am not  able to follow this in the records I have from podiatry. He has not had a recent x-ray The patient has been using Neosporin on the wound. He is wearing a Darco shoe. He is still very active up on his foot working and exercising. Past medical history; type 2 diabetes ketosis-prone, leg swelling with a negative DVT study in July. Non-smoker ABI in our clinic was 0.85 on the left 10/16; substantial wound on the plantar left fifth met head extending laterally almost to the dorsal fifth MTP. We have been using silver alginate we gave him a Darco forefoot off loader. An x-ray did not show evidence of osteomyelitis did note soft tissue emphysema which I think was due to gas tracking through an open wound. There is no doubt in my mind he requires an MRI 10/23; MRI not booked until 3 November at the earliest this is largely due to his glucose sensor in the right arm. We have been using silver alginate. There has been an improvement 10/29; I am still not exactly sure when his MRI is booked for. He says it is the third but it is the 10th in epic. This definitely needs to be done. He is running [name]-grade fever today but no other symptoms. No real improvement in the 1 02/26/2019 patient presents today for a follow-up visit here in our clinic he is last been seen in the clinic on October 29. Subsequently we were working on getting MRI to evaluate and see what exactly was going on and where we would need to go from the standpoint of whether or not he had osteomyelitis and again what treatments were going be required. Subsequently the patient ended up being admitted to the hospital on 02/07/2019 and was discharged on 02/14/2019. This is a somewhat interesting admission with a discharge diagnosis of pneumonia due to COVID-19 although he was positive for COVID-19 when tested at the urgent care but negative x2 when he was actually in the hospital. With that being said he  did have acute respiratory failure with hypoxia and it was noted he also have a left foot ulceration with osteomyelitis. With that being said he did require oxygen for his pneumonia and I level 4 L. He was placed on antivirals and steroids for the COVID-19. He was also transferred to the [place] at one point. Nonetheless he did subsequently discharged home and since being home has done much better in that regard. The CT angiogram did not show any pulmonary embolism. With regard to the osteomyelitis the patient was placed on vancomycin and Zosyn while in the hospital but has been changed to Augmentin at discharge. It was also recommended that he follow-up with wound care and podiatry. Podiatry however wanted him to see [name] according to the patient prior to them doing anything further. His hemoglobin A1c was 9.9 as noted in the hospital. Have an MRI of the left foot performed while in the hospital on 02/04/2019. 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 There is an overlying plantar open wound noted an abscess tracking back along the lateral aspect of the fifth metatarsal shaft. There is otherwise diffuse cellulitis and mild fasciitis without findings of polymyositis. The patient did have recently pneumonia secondary to COVID-19 I looked in the chart through epic and it does appear that the patient may need to have an additional x-ray just to ensure everything is cleared and that he  has no airspace disease prior to putting him into the chamber. 03/05/2019; patient was readmitted to the clinic last week. He was hospitalized twice for a viral upper respiratory tract infection from 11/1 through 11/4 and then 11/5 through 11/12 ultimately this turned out to be Covid pneumonitis. Although he was discharged on oxygen he is not using it. He says he feels fine. He has no exercise limitation no cough no  sputum. His O2 sat in our clinic today was 100% on room air. 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 He received Vanco and Zosyn in the hospital and then was discharged on 2 weeks of Augmentin. I do not see any relevant cultures. He was supposed to follow-up with infectious disease but I do not see that he has an appointment. 12/8; patient saw Dr. Novella Olive of infectious disease last week. He felt that he had had adequate antibiotic therapy. He did not go to follow-up with Dr. Amalia Hailey of podiatry and I have again talked to him about the pros and cons of this. He does not want to consider a ray amputation of this time. He is aware of the risks of recurrence, migration etc. He started HBO today and tolerated this well. He can complete the Augmentin that I gave him last week. I have looked over the lab work that Dr. Chana Bode ordered his C-reactive protein was 3.3 and his sedimentation rate was 17. The C-reactive protein is never really been measurably that high in this patient 12/15; not much change in the wound today however he has undermining along the lateral part of the foot again more extensively than last week. He has some rims of epithelialization. We have been using silver alginate. He is undergoing hyperbarics but did not dive today 12/18; in for his obligatory first total contact cast change. Unfortunately there was pus coming from the undermining area around his fifth metatarsal head. This was cultured but will preclude reapplication of a cast. He is seen in conjunction with HBO 12/24; patient had staph lugdunensis in the wound in the undermining area laterally last time. We put him on doxycycline which should have covered this. The wound looks better today. I am going to give him another week of doxycycline before reattempting the total contact cast 12/31; the patient is  completing antibiotics. Hemorrhagic debris in the distal part of the wound with some undermining distally. He also had hyper granulation. Extensive debridement with a #5 curette. The infected area that was on the lateral part of the fifth met head is closed over. I do not think he needs any more antibiotics. Patient was seen prior to HBO. Preparations for a total contact cast were made in the cast will be placed post hyperbarics Electronic Signature(s) Signed: 04/04/2019 2:35:12 PM By: [name] MD Entered By: [name] on 04/04/2019 09:21:44 -------------------------------------------------------------------------------- Physical Exam Details Patient Name: Date of Service: [name], Max 04/04/2019 8:00 AM Medical Record [id number] Patient Account Number: [id number] Date of Birth/Sex: Treating RN: 1985/09/02 (33 y.o. M) Primary Care Provider: Other Clinician: Kandice [name] Referring Provider: Treating Provider/Extender:Hosie Sharman, [name], [name] in Treatment: 11 Constitutional Sitting or standing Blood Pressure is within target range for patient.. Pulse regular and within target range for patient.Marland Kitchen Respirations regular, non-labored and within target range.. Temperature is normal and within the target range for the patient.Marland Kitchen Appears in no distress. Notes Wound exam; the infected  area coming going over the lateral part of the fifth metatarsal head has closed over. Wound surface debrided of necrotic debris over the distal part of the wound and the undermining area superiorly. Finally over the majority of the wound hyper granulation. #5 curette hemostasis with silver nitrate and a pressure dressing. Electronic Signature(s) Signed: 04/04/2019 2:35:12 PM By: [name] MD Entered By: [name] on 04/04/2019 09:24:40 -------------------------------------------------------------------------------- Physician Orders Details Patient Name: Date of  Service: [name], Max 04/04/2019 8:00 AM Medical Record [id number] Patient Account Number: [id number] Date of Birth/Sex: Treating RN: 1985-07-12 (33 y.o. 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 - Thursday Dressing Change Frequency Wound #1 Left Metatarsal head fifth Do not change entire dressing for one week. Skin Barriers/Peri-Wound Care Skin Prep Wound Cleansing Wound #1 Left Metatarsal head fifth May shower with protection. Primary Wound Dressing Wound #1 Left Metatarsal head fifth Calcium Alginate with Silver - wound bed Secondary Dressing Wound #1 Left Metatarsal head fifth Foam - foam donut Dry Gauze Off-Loading Wound #1 Left Metatarsal head fifth Total Contact Cast to Left Lower Extremity Electronic Signature(s) Signed: 04/04/2019 2:35:12 PM By: [name] MD Signed: 04/04/2019 2:55:42 PM By: Deon Pilling Entered By: Deon Pilling on 04/04/2019 08:42:53 -------------------------------------------------------------------------------- Problem List Details Patient Name: Date of Service: [name], Max 04/04/2019 8:00 AM Medical Record [id number] Patient Account Number: [id number] Date of Birth/Sex: Treating RN: Oct 29, 1985 (33 y.o. 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 He comes in for review of a wound over the plantar fifth metatarsal head extending into the lateral part of the foot. He was followed for this previously by his podiatrist Dr. Cornelius Moras. As the patient tells his story he went to see podiatry first for a swelling he developed on the lateral part of his fifth metatarsal head in May. He states this was "open" by podiatry and the area closed. He was followed up in June and it was again opened callus removed and it closed promptly. There were plans being made for surgery on  the fifth metatarsal head in June however his blood sugar was apparently too high for anesthesia. Apparently the area was debrided and  opened again in June and it is never closed since. Looking over the records from podiatry I am really not able to follow this. It was clear when he was first seen it was before 5/14 at that point he already had a wound. By 5/17 the ulcer was resolved. I do not see anything about a procedure. On 5/28 noted to have pre-ulcerative moderate keratosis. X-ray noted 1/5 contracted toe and tailor's bunion and metatarsal deformity. On a visit date on 09/28/2018 the dorsal part of the left foot it healed and resolved. There was concern about swelling in his lower extremity he was sent to the ER.. As far as I can tell he was seen in the ER on 7/12 with an ulcer on his left foot. A DVT rule out of the left leg was negative. I do not think I have complete records from podiatry but I am not able to verify the procedures this patient states he had. He states after the last procedure the wound has never closed although I am not able to follow this in the records I have from podiatry. He has not had a recent x-ray The patient has been using Neosporin on the wound. He is wearing a Darco shoe. He is still very active up on his foot working and exercising. Past medical history; type 2 diabetes ketosis-prone, leg swelling with a negative DVT study in July. Non-smoker ABI in our clinic was 0.85 on the left 10/16; substantial wound on the plantar left fifth met head extending laterally almost to the dorsal fifth MTP. We have been using silver alginate we gave him a Darco forefoot off loader. An x-ray did not show evidence of osteomyelitis did note soft tissue emphysema which I think was due to gas tracking through an open wound. There is no doubt in my mind he requires an MRI 10/23; MRI not booked until 3 November at the earliest this is largely due to his glucose sensor in the right arm. We have been using silver alginate. There has been an improvement 10/29; I am still not exactly sure when his MRI is booked for. He  says it is the third but it is the 10th in epic. This definitely needs to be done. He is running [name]-grade fever today but no other symptoms. No real improvement in the 1 02/26/2019 patient presents today for a follow-up visit here in our clinic he is last been seen in the clinic on October 29. Subsequently we were working on getting MRI to evaluate and see what exactly was going on and where we would need to go from the standpoint of whether or not he had osteomyelitis and again what treatments were going be required. Subsequently the patient ended up being admitted to the hospital on 02/07/2019 and was discharged on 02/14/2019. This is a somewhat interesting admission with a discharge diagnosis of pneumonia due to COVID-19 although he was positive for COVID-19 when tested at the urgent care but negative x2 when he was actually in the hospital. With that being said he did have acute respiratory failure with hypoxia and it was noted he also have a left foot ulceration with osteomyelitis. With that being said he did require oxygen for his pneumonia and I level 4 L. He was placed on antivirals and steroids for the COVID-19. He was also transferred to the [facility]  [place] at one point. Nonetheless he did subsequently discharged home and since being home has done much better in that regard. The CT angiogram did not show any pulmonary embolism. With regard to the osteomyelitis the patient was placed on vancomycin and Zosyn while in the hospital but has been changed to Augmentin at discharge. It was also recommended that he follow-up with wound care and podiatry. Podiatry however wanted him to see [name] according to the patient prior to them doing anything further. His hemoglobin A1c was 9.9 as noted in the hospital. Have an MRI of the left foot performed while in the hospital on 02/04/2019. This showed evidence of septic arthritis at the fifth MTP joint and osteomyelitis involving the fifth metatarsal  head and proximal phalanx. There is an overlying plantar open wound noted an abscess tracking back along the lateral aspect of the fifth metatarsal shaft. There is otherwise diffuse cellulitis and mild fasciitis without findings of polymyositis. The patient did have recently pneumonia secondary to COVID-19 I looked in the chart through epic and it does appear that the patient may need to have an additional x-ray just to ensure everything is cleared and that he has no airspace disease prior to putting him into the chamber. 03/05/2019; patient was readmitted to the clinic last week. He was hospitalized twice for a viral upper respiratory tract infection from 11/1 through 11/4 and then 11/5 through 11/12 ultimately this turned out to be Covid pneumonitis. Although he was discharged on oxygen he is not using it. He says he feels fine. He has no exercise limitation no cough no sputum. His O2 sat in our clinic today was 100% on room air. 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 He received Vanco and Zosyn in the hospital and then was discharged on 2 weeks of Augmentin. I do not see any relevant cultures. He was supposed to follow-up with infectious disease but I do not see that he has an appointment. 12/8; patient saw Dr. Novella Olive of infectious disease last week. He felt that he had had adequate antibiotic therapy. He did not go to follow-up with Dr. Amalia Hailey of podiatry and I have again talked to him about the pros and cons of this. He does not want to consider a ray amputation of this time. He is aware of the risks of recurrence, migration etc. He started HBO today and tolerated this well. He can complete the Augmentin that I gave him last week. I have looked over the lab work that Dr. Chana Bode ordered his C-reactive protein was 3.3 and his sedimentation rate was 17. The C-reactive protein is  never really been measurably that high in this patient 12/15; not much change in the wound today however he has undermining along the lateral part of the foot again more extensively than last week. He has some rims of epithelialization. We have been using silver alginate. He is undergoing hyperbarics but did not dive today 12/18; in for his obligatory first total contact cast change. Unfortunately there was pus coming from the undermining area around his fifth metatarsal head. This was cultured but will preclude reapplication of a cast. He is seen in conjunction with HBO 12/24; patient had staph lugdunensis in the wound in the undermining area laterally last time. We put him on doxycycline which should have covered this. The wound looks better today. I am going to give him  another week of doxycycline before reattempting the total contact cast 12/31; the patient is completing antibiotics. Hemorrhagic debris in the distal part of the wound with some undermining distally. He also had hyper granulation. Extensive debridement with a #5 curette. The infected area that was on the lateral part of the fifth met head is closed over. I do not think he needs any more antibiotics. Patient was seen prior to HBO. Preparations for a total contact cast were made in the cast will be placed post hyperbarics Objective Constitutional Sitting or standing Blood Pressure is within target range for patient.. Pulse regular and within target range for patient.Marland Kitchen Respirations regular, non-labored and within target range.. Temperature is normal and within the target range for the patient.Marland Kitchen Appears in no distress. Vitals Time Taken: 8:05 AM, Height: 77 in, Weight: 260 lbs, BMI: 30.8, Temperature: 98.3 F, Pulse: 105 bpm, Respiratory Rate: 18 breaths/min, Blood Pressure: 122/71 mmHg. General Notes: Wound exam; the infected area coming going over the lateral part of the fifth metatarsal head has closed over. Wound surface  debrided of necrotic debris over the distal part of the wound and the undermining area superiorly. Finally over the majority of the wound hyper granulation. #5 curette hemostasis with silver nitrate and a pressure dressing. Integumentary (Hair, Skin) Wound #1 status is Open. Original cause of wound was Trauma. The wound is located on the Left Metatarsal head fifth. The wound measures 2cm length x 2.5cm width x 0.4cm depth; 3.927cm^2 area and 1.571cm^3 volume. There is Fat Layer (Subcutaneous Tissue) Exposed exposed. There is no tunneling noted, however, there is undermining starting at 6:00 and ending at 3:00 with a maximum distance of 1cm. There is a medium amount of serosanguineous drainage noted. The wound margin is well defined and not attached to the wound base. There is large (67-100%) pink granulation within the wound bed. There is a small (1-33%) amount of necrotic tissue within the wound bed including Eschar and Adherent Slough. 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 There was a Excisional Skin/Subcutaneous Tissue Debridement with a total area of 7.5 sq cm performed by [name], MD. With the following instrument(s): Curette to remove Viable and Non-Viable tissue/material. Material removed includes Subcutaneous Tissue, Slough, Skin: Dermis, and Fibrin/Exudate after achieving pain control using Lidocaine 4% Topical Solution. A time out was conducted at 08:38, prior to the start of the procedure. A Moderate amount  of bleeding was controlled with Silver Nitrate. The procedure was tolerated well with a pain level of 0 throughout and a pain level of 0 following the procedure. Post Debridement Measurements: 2cm length x 2.5cm width x 0.4cm depth; 1.571cm^3 volume. Character of Wound/Ulcer Post Debridement is improved. Severity of Tissue Post Debridement is: Fat layer exposed. Post procedure Diagnosis Wound #1: Same as Pre-Procedure Pre-procedure diagnosis of Wound #1 is a Diabetic Wound/Ulcer of the Lower Extremity located on the Left Metatarsal head fifth . There was a Total Contact Cast Procedure by [name], MD. Post procedure Diagnosis Wound #1: Same as Pre-Procedure Plan Follow-up Appointments: Return Appointment in 1 week. - Thursday Dressing Change Frequency: Wound #  1 Left Metatarsal head fifth: Do not change entire dressing for one week. Skin Barriers/Peri-Wound Care: Skin Prep Wound Cleansing: Wound #1 Left Metatarsal head fifth: May shower with protection. Primary Wound Dressing: Wound #1 Left Metatarsal head fifth: Calcium Alginate with Silver - wound bed Secondary Dressing: Wound #1 Left Metatarsal head fifth: Foam - foam donut Dry Gauze Off-Loading: Wound #1 Left Metatarsal head fifth: Total Contact Cast to Left Lower Extremity #1 Silver alginate the primary dressing 2. We can complete his antibiotics 3. Back in a total contact cast 4. Continue HBO Electronic Signature(s) Signed: 04/04/2019 2:35:12 PM By: [name] MD Entered By: [name] on 04/04/2019 09:26:00 -------------------------------------------------------------------------------- Total Contact Cast Details Patient Name: Date of Service: [name], Max 04/04/2019 8:00 AM Medical Record [id number] Patient Account Number: [id number] Date of Birth/Sex: Treating RN: 1985-11-19 (33 y.o. M) Primary Care Provider: Kandice [name] Other Clinician: Referring Provider: Treating  Provider/Extender:Jlyn Cerros, [name], [name] in Treatment: 11 Total Contact Cast Applied for Wound Assessment: Wound #1 Left Metatarsal head fifth Performed By: Physician [name], MD Post Procedure Diagnosis Same as Pre-procedure Electronic Signature(s) Signed: 04/04/2019 2:35:12 PM By: [name] MD Entered By: [name] on 04/04/2019 09:19:11 -------------------------------------------------------------------------------- SuperBill Details Patient Name: Date of Service: Mancuso, Max 04/04/2019 Medical Record [id number] Patient Account Number: [id number] Date of Birth/Sex: Treating RN: Aug 21, 1985 (33 y.o. [name], Meta.Reding Primary Care Provider: Kandice [name] Other Clinician: Referring Provider: Treating Provider/Extender:Adeoluwa Silvers, [name], [name] in Treatment: 11 Diagnosis Coding ICD-10 Codes Code Description E11.621 Type 2 diabetes mellitus with foot ulcer L97.521 Non-pressure chronic ulcer of other part of left foot limited to breakdown of skin E11.42 Type 2 diabetes mellitus with diabetic polyneuropathy M86.672 Other chronic osteomyelitis, left ankle and foot M00.9 Pyogenic arthritis, unspecified Facility Procedures CPT4 Code Description: 03009233 11042 - DEB SUBQ TISSUE 20 SQ CM/< ICD-10 Diagnosis Description L97.521 Non-pressure chronic ulcer of other part of left foot limite E11.621 Type 2 diabetes mellitus with foot ulcer Modifier: d to breakdo Quantity: 1 wn of skin Physician Procedures CPT4 Code Description: 0076226 33354 - WC PHYS SUBQ TISS 20 SQ CM ICD-10 Diagnosis Description L97.521 Non-pressure chronic ulcer of other part of left foot limite E11.621 Type 2 diabetes mellitus with foot ulcer Modifier: d to breakdo Quantity: 1 wn of skin Electronic Signature(s) Signed: 04/04/2019 2:35:12 PM By: [name] MD Entered By: [name] on 04/04/2019 09:26:10

## 2019-04-04 NOTE — Progress Notes (Signed)
[name] [name] ([id number]) Visit Report for 04/04/2019 Arrival Information Details Patient Name: Date of Service: [name] [name] 04/04/2019 10:00 AM Medical Record [id number] Patient Account Number: [id number] Date of Birth/Sex: Treating RN: 1986/01/27 (33 y.o. M) Primary Care Zahari Fazzino: [name] Other Clinician: [name] Referring Dailon Sheeran: Treating Aanika Defoor/Extender:Robson, [name], [name]: 11 Visit Information History Since Last Visit Added or deleted any medications: No Patient Arrived: Ambulatory Any new allergies or adverse reactions: No Arrival Time: 09:10 Had a fall or experienced change in No Accompanied By: self activities of daily living that may affect Transfer Assistance: None risk of falls: Patient Identification Verified: Yes Signs or symptoms of abuse/neglect since last No Secondary Verification Process Yes visito Completed: Hospitalized since last visit: No Patient Requires Transmission-Based No Implantable device outside of the clinic excluding No Precautions: cellular tissue based products placed in the center Patient Has Alerts: No since last visit: Pain Present Now: No Electronic Signature(s) Signed: 04/04/2019 12:26:55 PM By: [name] EMT/HBOT Entered By: [name] on 04/04/2019 10:26:05 -------------------------------------------------------------------------------- Encounter Discharge Information Details Patient Name: Date of Service: [name] [name] 04/04/2019 10:00 AM Medical Record Number:[id number] Patient Account Number: [id number] Date of Birth/Sex: Treating RN: [birth year] (33 y.o. M) Primary Care Hezzie Karim: [name] Other Clinician: [name] Referring Elexus Barman: Treating Stephen Baruch/Extender:Robson, [name], [name]: 11 Encounter Discharge Information Items Discharge Condition: Stable Ambulatory Status: Ambulatory Discharge Destination:  Home Transportation: Private Auto Accompanied By: self Schedule Follow-up Appointment: Yes Clinical Summary of Care: Patient Declined Electronic Signature(s) Signed: 04/04/2019 12:26:55 PM By: [name] EMT/HBOT Entered By: [name] on 04/04/2019 11:28:44 -------------------------------------------------------------------------------- Patient/Caregiver Education Details Patient Name: [name] [name] 12/31/2020andnbsp10:00 Date of Service: AM Medical Record [id number] Number: Patient Account Number: [id number] Treating RN: 1985-07-13 (33 y.o. Date of Birth/Gender: M) Other Clinician: [name] Primary Care Treating [name], Turner Daniels Physician: Physician/Extender: Referring Physician: Smith Mince in Treatment: 11 Education Assessment Education Provided To: Patient Education Topics Provided Hyperbaric Oxygenation: Methods: Explain/Verbal Responses: State content correctly Electronic Signature(s) Signed: 04/04/2019 12:26:55 PM By: [name] EMT/HBOT Entered By: [name] on 04/04/2019 11:28:30 -------------------------------------------------------------------------------- Vitals Details Patient Name: Date of Service: [name] [name] 04/04/2019 10:00 AM Medical Record [id number] Patient Account Number: [id number] Date of Birth/Sex: Treating RN: 1985/04/19 (33 y.o. M) Primary Care [name] Mattioli: [name] Other Clinician: [name] Referring Khi Mcmillen: Treating Kate Larock/Extender:Robson, [name], [name]: 11 Vital Signs Time Taken: 09:15 Temperature (F): 97.5 Height (in): 77 Pulse (bpm): 110 Weight (lbs): 260 Respiratory Rate (breaths/min): 18 Body Mass Index (BMI): 30.8 Blood Pressure (mmHg): 133/71 Capillary Blood Glucose (mg/dl): 198 Reference Range: 80 - 120 mg / dl Electronic Signature(s) Signed: 04/04/2019 12:26:55 PM By: [name] EMT/HBOT Entered By: [name] on 04/04/2019 69:48:54

## 2019-04-04 NOTE — Progress Notes (Signed)
Sussman, [name] ([id number]) Visit Report for 04/04/2019 SuperBill Details Patient Name: Date of Service: Rotz, [name] 04/04/2019 Medical Record [id number] Patient Account Number: [id number] Date of Birth/Sex: Treating RN: [birth year] (33 y.o. M) Primary Care Provider: [name] Other Clinician: [name] Referring Provider: Treating Provider/Extender:Mekaela Azizi, [name], [name]: 11 Diagnosis Coding ICD-10 Codes Code Description E11.621 Type 2 diabetes mellitus with foot ulcer L97.521 Non-pressure chronic ulcer of other part of left foot limited to breakdown of skin E11.42 Type 2 diabetes mellitus with diabetic polyneuropathy M86.672 Other chronic osteomyelitis, left ankle and foot M00.9 Pyogenic arthritis, unspecified Facility Procedures CPT4 Code Description Modifier Quantity [id number] G0277-(Facility Use Only) HBOT, full body chamber, 5min 4 Physician Procedures CPT4 Code Description Modifier Quantity [id number] 37169 - WC PHYS HYPERBARIC OXYGEN THERAPY 1 ICD-10 Diagnosis Description E11.621 Type 2 diabetes mellitus with foot ulcer Electronic Signature(s) Signed: 04/04/2019 12:26:55 PM By: [name] EMT/HBOT Signed: 04/04/2019 2:35:12 PM By: [name] MD Entered By: [name] on 04/04/2019 11:28:16

## 2019-04-08 ENCOUNTER — Encounter (HOSPITAL_BASED_OUTPATIENT_CLINIC_OR_DEPARTMENT_OTHER): Payer: BC Managed Care – PPO | Admitting: Internal Medicine

## 2019-04-08 ENCOUNTER — Other Ambulatory Visit: Payer: Self-pay

## 2019-04-08 DIAGNOSIS — M009 Pyogenic arthritis, unspecified: Secondary | ICD-10-CM | POA: Insufficient documentation

## 2019-04-08 DIAGNOSIS — M86672 Other chronic osteomyelitis, left ankle and foot: Secondary | ICD-10-CM | POA: Insufficient documentation

## 2019-04-08 DIAGNOSIS — L97521 Non-pressure chronic ulcer of other part of left foot limited to breakdown of skin: Secondary | ICD-10-CM | POA: Insufficient documentation

## 2019-04-08 DIAGNOSIS — E11621 Type 2 diabetes mellitus with foot ulcer: Secondary | ICD-10-CM | POA: Insufficient documentation

## 2019-04-08 DIAGNOSIS — E1142 Type 2 diabetes mellitus with diabetic polyneuropathy: Secondary | ICD-10-CM | POA: Insufficient documentation

## 2019-04-08 LAB — GLUCOSE, CAPILLARY
Glucose-Capillary: 218 mg/dL — ABNORMAL HIGH (ref 70–99)
Glucose-Capillary: 185 mg/dL — ABNORMAL HIGH (ref 70–99)

## 2019-04-08 NOTE — Progress Notes (Signed)
Dewoody, [name] ([id number]) Visit Report for 04/08/2019 SuperBill Details Patient Name: Date of Service: [name], [name] 04/08/2019 Medical Record Number:[id number] Patient Account Number: [id number] Date of Birth/Sex: Treating RN: 1985-10-22 (34 y.o. M) Primary Care Provider: Katy Apo Other Clinician: [name] Referring Provider: Treating Provider/Extender:Grafton Warzecha, Almira Coaster, Chalmers Guest in Treatment: 12 Diagnosis Coding ICD-10 Codes Code Description E11.621 Type 2 diabetes mellitus with foot ulcer L97.521 Non-pressure chronic ulcer of other part of left foot limited to breakdown of skin E11.42 Type 2 diabetes mellitus with diabetic polyneuropathy M86.672 Other chronic osteomyelitis, left ankle and foot M00.9 Pyogenic arthritis, unspecified Facility Procedures CPT4 Code Description Modifier Quantity [id number] G0277-(Facility Use Only) HBOT, full body chamber, 4 Physician Procedures CPT4 Code Description Modifier Quantity 1194174 99183 - WC PHYS HYPERBARIC OXYGEN THERAPY 1 ICD-10 Diagnosis Description E11.621 Type 2 diabetes mellitus with foot ulcer Electronic Signature(s) Signed: 04/08/2019 4:12:00 PM By: [name] EMT/HBOT Signed: 04/08/2019 4:56:38 PM By: Baltazar Najjar MD Entered By: [name] on 04/08/2019 13:17:14

## 2019-04-08 NOTE — Progress Notes (Addendum)
[name], [name] ([id number]) Visit Report for 04/08/2019 HBO Details Patient Name: Date of Service: [place], [name] 04/08/2019 10:00 AM Medical Record Number:[id number] Patient Account Number: [id number] Date of Birth/Sex: Treating RN: July 10, 1985 (33 y.o. M) Primary Care Raford Brissett: Katy Apo Other Clinician: [name] Referring Algenis Ballin: Treating Laterria Lasota/Extender:Robson, Almira Coaster, Chalmers Guest in Treatment: 12 HBO Treatment Course Details Treatment Course Number: 1 Ordering Scottie Stanish: Baltazar Najjar Total Treatments Ordered: 40 HBO Treatment Start Date: 03/12/2019 HBO Indication: Diabetic Ulcer(s) of the Lower Extremity HBO Treatment Details Treatment Number: 15 Patient Type: Outpatient Chamber Type: Monoplace Chamber Serial #: [id number] Treatment Protocol: 2.0 ATA with 90 minutes oxygen, and no air breaks Treatment Details Compression Rate Down: 2.0 psi / minute De-Compression Rate Up: 2.0 psi / minute Air breaks and CompressTx Pressure breathing DecompressDecompress Begins Reached periods Begins Ends (leave unused spaces blank) Chamber Pressure (ATA)1 2 ------2 1 Clock Time (24 hr) 10:24 10:32 - - - - - - 12:02 12:10 Treatment Length: 106 (minutes) Treatment Segments: 4 Vital Signs Capillary Blood Glucose Reference Range: 80 - 120 mg / dl HBO Diabetic Blood Glucose Intervention Range: <131 mg/dl or >809 mg/dl Time Vitals Blood Respiratory Capillary Blood Glucose Pulse Action Type: Pulse: Temperature: Taken: Pressure: Rate: Glucose (mg/dl): Meter #: Oximetry (%) Taken: Pre 10:15 146/83 105 19 98.3 218 Post 12:12 156/93 83 18 98 185 Treatment Response Treatment Toleration: Well Treatment Completion Treatment Completed without Adverse Event Status: Additional Procedure Documentation Tissue Sevierity: Necrosis of bone Aneesa Romey Notes No concerns with treatment given Physician HBO Attestation: I certify that I supervised this HBO treatment  in accordance with Medicare guidelines. A trained Yes emergency response team is readily available per hospital policies and procedures. Continue HBOT as ordered. Yes Electronic Signature(s) Signed: 04/08/2019 4:56:38 PM By: Baltazar Najjar MD Previous Signature: 04/08/2019 4:12:00 PM Version By: [name] EMT/HBOT Entered By: Baltazar Najjar on 04/08/2019 16:54:14 -------------------------------------------------------------------------------- HBO Safety Checklist Details Patient Name: Date of Service: Maclachlan, [name] 04/08/2019 10:00 AM Medical Record Number:[id number] Patient Account Number: [id number] Date of Birth/Sex: Treating RN: 12-May-1985 (33 y.o. M) Primary Care Denine Brotz: Katy Apo Other Clinician: [name] Referring Mehar Kirkwood: Treating Jackqulyn Mendel/Extender:Robson, Almira Coaster, Chalmers Guest in Treatment: 12 HBO Safety Checklist Items Safety Checklist Consent Form Signed Patient voided / foley secured and emptied When did you last eato n/a Last dose of injectable or oral agent insulin NA Ostomy pouch emptied and vented if applicable All implantable devices assessed, documented and approved NA Intravenous access site secured and place Valuables secured Linens and cotton and cotton/polyester blend (less than 51% polyester) Personal oil-based products / skin lotions / body lotions removed NA Wigs or hairpieces removed NA Smoking or tobacco materials removed Books / newspapers / magazines / loose paper removed Cologne, aftershave, perfume and deodorant removed Jewelry removed (may wrap wedding band) NA Make-up removed Hair care products removed NA Battery operated devices (external) removed NA Heating patches and chemical warmers removed NA Titanium eyewear removed NA Nail polish cured greater than 10 hours NA Casting material cured greater than 10 hours NA Hearing aids removed NA Loose dentures or partials removed NA Prosthetics have been  removed Patient demonstrates correct use of air break device (if applicable) Patient concerns have been addressed Patient grounding bracelet on and cord attached to chamber Specifics for Inpatients (complete in addition to above) Medication sheet sent with patient Intravenous medications needed or due during therapy sent with patient Drainage tubes (e.g. nasogastric tube or chest tube secured and vented) Endotracheal or Tracheotomy  tube secured Cuff deflated of air and inflated with saline Airway suctioned Electronic Signature(s) Signed: 04/08/2019 10:39:15 AM By: [name] EMT/HBOT Entered By: [name] on 04/08/2019 10:39:14

## 2019-04-08 NOTE — Progress Notes (Signed)
[place], [name] ([id number]) Visit Report for 04/08/2019 Arrival Information Details Patient Name: Date of Service: [place], [name] 04/08/2019 10:00 AM Medical Record Number:[id number] Patient Account Number: [id number] Date of Birth/Sex: Treating RN: 08-Jul-1985 (33 y.o. M) Primary Care Chala Gul: Katy Apo Other Clinician: [name] Referring Octavius Shin: Treating Stelios Kirby/Extender:Robson, Almira Coaster, Chalmers Guest in Treatment: 12 Visit Information History Since Last Visit Added or deleted any medications: No Patient Arrived: Ambulatory Any new allergies or adverse reactions: No Arrival Time: 10:10 Had a fall or experienced change in No Accompanied By: self activities of daily living that may affect Transfer Assistance: None risk of falls: Patient Identification Verified: Yes Signs or symptoms of abuse/neglect since last No Secondary Verification Process Yes visito Completed: Hospitalized since last visit: No Patient Requires Transmission-Based No Implantable device outside of the clinic excluding No Precautions: cellular tissue based products placed in the center Patient Has Alerts: No since last visit: Pain Present Now: No Electronic Signature(s) Signed: 04/08/2019 4:12:00 PM By: [name] EMT/HBOT Entered By: [name] on 04/08/2019 10:38:12 -------------------------------------------------------------------------------- Encounter Discharge Information Details Patient Name: Date of Service: [place], [name] 04/08/2019 10:00 AM Medical Record Number:[id number] Patient Account Number: [id number] Date of Birth/Sex: Treating RN: 1985/08/23 (33 y.o. M) Primary Care Kalin Amrhein: Katy Apo Other Clinician: [name] Referring Ismar Yabut: Treating Detron Carras/Extender:Robson, Almira Coaster, Chalmers Guest in Treatment: 12 Encounter Discharge Information Items Discharge Condition: Stable Ambulatory Status: Ambulatory Discharge Destination:  Home Transportation: Private Auto Accompanied By: self Schedule Follow-up Appointment: Yes Clinical Summary of Care: Patient Declined Electronic Signature(s) Signed: 04/08/2019 4:12:00 PM By: [name] EMT/HBOT Entered By: [name] on 04/08/2019 13:17:59 -------------------------------------------------------------------------------- Patient/Caregiver Education Details Patient Name: Date of Service: [place], [name] 1/4/2021andnbsp10:00 AM Medical Record Number:[id number] Patient Account Number: [id number] Date of Birth/Gender: Treating RN: 1985-04-14 (33 y.o. M) Primary Care Physician: Katy Apo Other Clinician: [name] Referring Physician: Treating Physician/Extender:Robson, Almira Coaster, Chalmers Guest in Treatment: 12 Education Assessment Education Provided To: Patient Education Topics Provided Hyperbaric Oxygenation: Methods: Explain/Verbal Responses: State content correctly Electronic Signature(s) Signed: 04/08/2019 4:12:00 PM By: [name] EMT/HBOT Entered By: [name] on 04/08/2019 13:17:52 -------------------------------------------------------------------------------- Vitals Details Patient Name: Date of Service: [place], [name] 04/08/2019 10:00 AM Medical Record Number:[id number] Patient Account Number: [id number] Date of Birth/Sex: Treating RN: 28-Jan-1986 (33 y.o. M) Primary Care Arlenne Kimbley: Katy Apo Other Clinician: [name] Referring Loras Grieshop: Treating Trigo Winterbottom/Extender:Robson, Almira Coaster, Chalmers Guest in Treatment: 12 Vital Signs Time Taken: 10:15 Temperature (F): 98.3 Height (in): 77 Pulse (bpm): 105 Weight (lbs): 260 Respiratory Rate (breaths/min): 19 Body Mass Index (BMI): 30.8 Blood Pressure (mmHg): 146/83 Capillary Blood Glucose (mg/dl): 166 Reference Range: 80 - 120 mg / dl Electronic Signature(s) Signed: 04/08/2019 4:12:00 PM By: [name] EMT/HBOT Entered By: [name] on 04/08/2019  10:38:28

## 2019-04-09 ENCOUNTER — Encounter (HOSPITAL_BASED_OUTPATIENT_CLINIC_OR_DEPARTMENT_OTHER): Payer: BC Managed Care – PPO | Admitting: Internal Medicine

## 2019-04-10 ENCOUNTER — Encounter (HOSPITAL_BASED_OUTPATIENT_CLINIC_OR_DEPARTMENT_OTHER): Payer: BC Managed Care – PPO | Admitting: Physician Assistant

## 2019-04-10 ENCOUNTER — Other Ambulatory Visit: Payer: Self-pay

## 2019-04-10 DIAGNOSIS — E11621 Type 2 diabetes mellitus with foot ulcer: Secondary | ICD-10-CM | POA: Diagnosis not present

## 2019-04-10 LAB — GLUCOSE, CAPILLARY
Glucose-Capillary: 218 mg/dL — ABNORMAL HIGH (ref 70–99)
Glucose-Capillary: 172 mg/dL — ABNORMAL HIGH (ref 70–99)

## 2019-04-10 NOTE — Progress Notes (Signed)
[place], [name] ([id number]) Visit Report for 04/10/2019 Arrival Information Details Patient Name: Date of Service: [name], [name] 04/10/2019 10:00 AM Medical Record Number:[id number] Patient Account Number: [id number] Date of Birth/Sex: Treating RN: [birth year] (34 y.o. M) Primary Care Tamirra Sienkiewicz: Katy Apo Other Clinician: [name] Referring Nailyn Dearinger: Treating Brandt Chaney/Extender:Stone III, Aleda Grana, Chalmers Guest in Treatment: 12 Visit Information History Since Last Visit Added or deleted any medications: No Patient Arrived: Ambulatory Any new allergies or adverse reactions: No Arrival Time: 10:00 Had a fall or experienced change in No Accompanied By: self activities of daily living that may affect Transfer Assistance: None risk of falls: Patient Identification Verified: Yes Signs or symptoms of abuse/neglect since last No Secondary Verification Process Yes visito Completed: Hospitalized since last visit: No Patient Requires Transmission-Based No Implantable device outside of the clinic excluding No Precautions: cellular tissue based products placed in the center Patient Has Alerts: No since last visit: Pain Present Now: No Electronic Signature(s) Signed: 04/10/2019 3:43:47 PM By: [name] EMT/HBOT Entered By: [name] on 04/10/2019 10:46:17 -------------------------------------------------------------------------------- Encounter Discharge Information Details Patient Name: Date of Service: [name], [name] 04/10/2019 10:00 AM Medical Record Number:[id number] Patient Account Number: [id number] Date of Birth/Sex: Treating RN: 05/09/1985 (34 y.o. M) Primary Care Valmai Vandenberghe: Katy Apo Other Clinician: [name] Referring Zaveon Gillen: Treating Maty Zeisler/Extender:Stone III, Aleda Grana, Chalmers Guest in Treatment: 12 Encounter Discharge Information Items Discharge Condition: Stable Ambulatory Status: Ambulatory Discharge Destination:  Home Transportation: Private Auto Accompanied By: self Schedule Follow-up Appointment: Yes Clinical Summary of Care: Patient Declined Electronic Signature(s) Signed: 04/10/2019 3:43:47 PM By: [name] EMT/HBOT Entered By: [name] on 04/10/2019 12:01:01 -------------------------------------------------------------------------------- Patient/Caregiver Education Details Patient Name: Date of Service: [name], [name] 1/6/2021andnbsp10:00 AM Medical Record Number:[id number] Patient Account Number: [id number] Date of Birth/Gender: Treating RN: 07/10/1985 (34 y.o. M) Primary Care Physician: Katy Apo Other Clinician: [name] Referring Physician: Treating Physician/Extender:Stone III, Aleda Grana, Chalmers Guest in Treatment: 12 Education Assessment Education Provided To: Patient Education Topics Provided Hyperbaric Oxygenation: Methods: Explain/Verbal Responses: State content correctly Electronic Signature(s) Signed: 04/10/2019 3:43:47 PM By: [name] EMT/HBOT Entered By: [name] on 04/10/2019 12:00:48 -------------------------------------------------------------------------------- Vitals Details Patient Name: Date of Service: [name], [name] 04/10/2019 10:00 AM Medical Record Number:[id number] Patient Account Number: [id number] Date of Birth/Sex: Treating RN: March 11, 1986 (34 y.o. M) Primary Care Audrianna Driskill: Katy Apo Other Clinician: [name] Referring Sharonica Kraszewski: Treating Jahquan Klugh/Extender:Stone III, Aleda Grana, Deirdre Peer Weeks in Treatment: 12 Vital Signs Time Taken: 10:05 Temperature (F): 97.9 Height (in): 77 Pulse (bpm): 104 Weight (lbs): 260 Respiratory Rate (breaths/min): 16 Body Mass Index (BMI): 30.8 Blood Pressure (mmHg): 156/90 Capillary Blood Glucose (mg/dl): 973 Reference Range: 80 - 120 mg / dl Electronic Signature(s) Signed: 04/10/2019 3:43:47 PM By: [name] EMT/HBOT Entered By: [name] on 04/10/2019  10:46:38

## 2019-04-10 NOTE — Progress Notes (Addendum)
[name], [name] ([id number]) Visit Report for 04/10/2019 HBO Details Patient Name: Date of Service: Scal[place], [name] 04/10/2019 10:00 AM Medical Record [id number] Patient Account Number: [id number] Date of Birth/Sex: Treating RN: 1985-07-27 (34 y.o. M) Primary Care Marge Vandermeulen: [name] Other Clinician: [name] Referring Iline Buchinger: Treating Annaya Bangert/Extender:Stone III, Newell Coral, [name]: 12 HBO Treatment Course Details Treatment Course Number: 1 Ordering Dailyn Kempner: [name] Total Treatments Ordered: 40 HBO Treatment Start Date: 03/12/2019 HBO Indication: Diabetic Ulcer(s) of the Lower Extremity HBO Treatment Details Treatment Number: 16 Patient Type: Outpatient Chamber Type: Monoplace Chamber Serial #: [id number] Treatment Protocol: 2.0 ATA with 90 minutes oxygen, and no air breaks Treatment Details Compression Rate Down: 2.0 psi / minute De-Compression Rate Up: 2.0 psi / minute Air breaks and CompressTx Pressure breathing DecompressDecompress Begins Reached periods Begins Ends (leave unused spaces blank) Chamber Pressure (ATA)1 2 ------2 1 Clock Time (24 hr) 10:17 10:25 - - - - - - 11:55 12:03 Treatment Length: 106 (minutes) Treatment Segments: 4 Vital Signs Capillary Blood Glucose Reference Range: 80 - 120 mg / dl HBO Diabetic Blood Glucose Intervention Range: <131 mg/dl or >249 mg/dl Time Vitals Blood Respiratory Capillary Blood Glucose Pulse Action Type: Pulse: Temperature: Taken: Pressure: Rate: Glucose (mg/dl): Meter #: Oximetry (%) Taken: Pre 10:05 156/90 104 16 97.9 218 Post 12:05 149/73 91 14 98.1 172 Treatment Response Treatment Toleration: Well Treatment Completion Treatment Completed without Adverse Event Status: Additional Procedure Documentation Tissue Sevierity: Necrosis of bone Physician HBO Attestation: I certify that I supervised this HBO treatment in accordance with Medicare guidelines. A trained  Yes emergency response team is readily available per hospital policies and procedures. Continue HBOT as ordered. Yes Electronic Signature(s) Signed: 04/15/2019 4:26:09 PM By: Worthy Keeler PA-C Previous Signature: 04/10/2019 3:43:47 PM Version By: [name] EMT/HBOT Entered By: Worthy Keeler on 04/15/2019 16:26:08 -------------------------------------------------------------------------------- HBO Safety Checklist Details Patient Name: Date of Service: [name], [name] 04/10/2019 10:00 AM Medical Record [id number] Patient Account Number: [id number] Date of Birth/Sex: Treating RN: [birth year] (34 y.o. M) Primary Care Raevyn Sokol: [name] Other Clinician: [name] Referring Tekeshia Klahr: Treating Errick Salts/Extender:Stone III, Newell Coral, Ashby Dawes Weeks in Treatment: 12 HBO Safety Checklist Items Safety Checklist Consent Form Signed Patient voided / foley secured and emptied When did you last eato 0900 - ensure Last dose of injectable or oral agent insulin NA Ostomy pouch emptied and vented if applicable All implantable devices assessed, documented and approved NA Intravenous access site secured and place Valuables secured Linens and cotton and cotton/polyester blend (less than 51% polyester) Personal oil-based products / skin lotions / body lotions removed NA Wigs or hairpieces removed NA Smoking or tobacco materials removed Books / newspapers / magazines / loose paper removed Cologne, aftershave, perfume and deodorant removed Jewelry removed (may wrap wedding band) NA Make-up removed Hair care products removed NA Battery operated devices (external) removed NA Heating patches and chemical warmers removed NA Titanium eyewear removed NA Nail polish cured greater than 10 hours NA Casting material cured greater than 10 hours NA Hearing aids removed NA Loose dentures or partials removed NA Prosthetics have been removed Patient demonstrates correct use of air  break device (if applicable) Patient concerns have been addressed Patient grounding bracelet on and cord attached to chamber Specifics for Inpatients (complete in addition to above) Medication sheet sent with patient Intravenous medications needed or due during therapy sent with patient Drainage tubes (e.g. nasogastric tube or chest tube secured and vented) Endotracheal or Tracheotomy tube  secured Cuff deflated of air and inflated with saline Airway suctioned Electronic Signature(s) Signed: 04/10/2019 10:47:18 AM By: [name] EMT/HBOT Entered By: [name] on 04/10/2019 10:47:18

## 2019-04-11 ENCOUNTER — Other Ambulatory Visit: Payer: Self-pay

## 2019-04-11 ENCOUNTER — Encounter (HOSPITAL_BASED_OUTPATIENT_CLINIC_OR_DEPARTMENT_OTHER): Payer: BC Managed Care – PPO | Admitting: Internal Medicine

## 2019-04-11 DIAGNOSIS — E11621 Type 2 diabetes mellitus with foot ulcer: Secondary | ICD-10-CM | POA: Diagnosis not present

## 2019-04-11 LAB — GLUCOSE, CAPILLARY
Glucose-Capillary: 239 mg/dL — ABNORMAL HIGH (ref 70–99)
Glucose-Capillary: 218 mg/dL — ABNORMAL HIGH (ref 70–99)

## 2019-04-11 NOTE — Progress Notes (Addendum)
[name] ([id number]) Visit Report for 04/11/2019 HBO Details Patient Name: Date of Service: [name] 04/11/2019 10:00 AM Medical Record [id number] Patient Account Number: [id number] Date of Birth/Sex: Treating RN: March 08, 1986 (34 y.o. M) Primary Care Jaelan Rasheed: [name] Other Clinician: [name] Referring Deaysia Grigoryan: Treating Temari Schooler/Extender:Robson, [name], [name]: 12 HBO Treatment Course Details Treatment Course Number: 1 Ordering Caidence Kaseman: [name] Total Treatments Ordered: 40 HBO Treatment Start Date: 03/12/2019 HBO Indication: Diabetic Ulcer(s) of the Lower Extremity HBO Treatment Details Treatment Number: 17 Patient Type: Outpatient Chamber Type: Monoplace Chamber Serial #: [id number] Treatment Protocol: 2.0 ATA with 90 minutes oxygen, and no air breaks Treatment Details Compression Rate Down: 2.0 psi / minute De-Compression Rate Up: 2.0 psi / minute Air breaks and CompressTx Pressure breathing DecompressDecompress Begins Reached periods Begins Ends (leave unused spaces blank) Chamber Pressure (ATA)1 2 ------2 1 Clock Time (24 hr) 10:22 10:30 - - - - - - 12:00 12:08 Treatment Length: 106 (minutes) Treatment Segments: 4 Vital Signs Capillary Blood Glucose Reference Range: 80 - 120 mg / dl HBO Diabetic Blood Glucose Intervention Range: <131 mg/dl or >249 mg/dl Time Vitals Blood Respiratory Capillary Blood Glucose Pulse Action Type: Pulse: Temperature: Taken: Pressure: Rate: Glucose (mg/dl): Meter #: Oximetry (%) Taken: Pre 10:10 161/81 92 15 97.6 239 Post 12:10 142/98 84 17 97.9 218 Treatment Response Treatment Toleration: Well Treatment Completion Treatment Completed without Adverse Event Status: Additional Procedure Documentation Tissue Sevierity: Necrosis of bone Gerrod Maule Notes No concerns with treatment given Physician HBO Attestation: I certify that I supervised this HBO treatment  in accordance with Medicare guidelines. A trained Yes emergency response team is readily available per hospital policies and procedures. Continue HBOT as ordered. Yes Electronic Signature(s) Signed: 04/12/2019 4:55:01 AM By: [name] MD Previous Signature: 04/11/2019 4:21:35 PM Version By: [name] EMT/HBOT Entered By: [name] on 04/11/2019 17:05:10 -------------------------------------------------------------------------------- HBO Safety Checklist Details Patient Name: Date of Service: [name] 04/11/2019 10:00 AM Medical Record [id number] Patient Account Number: [id number] Date of Birth/Sex: Treating RN: [birth year] (34 y.o. M) Primary Care Brynnan Rodenbaugh: [name] Other Clinician: [name] Referring Katti Pelle: Treating Shamya Macfadden/Extender:Robson, [name], [name]: 12 HBO Safety Checklist Items Safety Checklist Consent Form Signed Patient voided / foley secured and emptied When did you last eato 0900 - ensure Last dose of injectable or oral agent insulin NA Ostomy pouch emptied and vented if applicable All implantable devices assessed, documented and approved NA Intravenous access site secured and place Valuables secured Linens and cotton and cotton/polyester blend (less than 51% polyester) Personal oil-based products / skin lotions / body lotions removed NA Wigs or hairpieces removed NA Smoking or tobacco materials removed Books / newspapers / magazines / loose paper removed Cologne, aftershave, perfume and deodorant removed Jewelry removed (may wrap wedding band) NA Make-up removed Hair care products removed NA Battery operated devices (external) removed NA Heating patches and chemical warmers removed NA Titanium eyewear removed NA Nail polish cured greater than 10 hours NA Casting material cured greater than 10 hours NA Hearing aids removed NA Loose dentures or partials removed NA Prosthetics have been  removed Patient demonstrates correct use of air break device (if applicable) Patient concerns have been addressed Patient grounding bracelet on and cord attached to chamber Specifics for Inpatients (complete in addition to above) Medication sheet sent with patient Intravenous medications needed or due during therapy sent with patient Drainage tubes (e.g. nasogastric tube or chest tube secured and vented) Endotracheal  or Tracheotomy tube secured Cuff deflated of air and inflated with saline Airway suctioned Electronic Signature(s) Signed: 04/11/2019 11:27:26 AM By: [name] EMT/HBOT Entered By: [name] on 04/11/2019 11:27:26

## 2019-04-11 NOTE — Progress Notes (Signed)
[place], [name] ([id number]) Visit Report for 04/11/2019 Arrival Information Details Patient Name: Date of Service: [place], [name] 04/11/2019 10:00 AM Medical Record Number:[id number] Patient Account Number: [id number] Date of Birth/Sex: Treating RN: 11-29-1985 (34 y.o. M) Primary Care Taylee Gunnells: Katy Apo Other Clinician: [name] Referring Attie Nawabi: Treating Samael Blades/Extender:Robson, Almira Coaster, Chalmers Guest in Treatment: 12 Visit Information History Since Last Visit Added or deleted any medications: No Patient Arrived: Ambulatory Any new allergies or adverse reactions: No Arrival Time: 10:05 Had a fall or experienced change in No Accompanied By: self activities of daily living that may affect Transfer Assistance: None risk of falls: Patient Identification Verified: Yes Signs or symptoms of abuse/neglect since last No Secondary Verification Process Yes visito Completed: Hospitalized since last visit: No Patient Requires Transmission-Based No Implantable device outside of the clinic excluding No Precautions: cellular tissue based products placed in the center Patient Has Alerts: No since last visit: Pain Present Now: No Electronic Signature(s) Signed: 04/11/2019 4:21:35 PM By: [name] EMT/HBOT Entered By: [name] on 04/11/2019 11:26:26 -------------------------------------------------------------------------------- Encounter Discharge Information Details Patient Name: Date of Service: [place], [name] 04/11/2019 10:00 AM Medical Record Number:[id number] Patient Account Number: [id number] Date of Birth/Sex: Treating RN: [birth year] (34 y.o. M) Primary Care Omaria Plunk: Katy Apo Other Clinician: [name] Referring Sharyah Bostwick: Treating Marleny Faller/Extender:Robson, Almira Coaster, Chalmers Guest in Treatment: 12 Encounter Discharge Information Items Discharge Condition: Stable Ambulatory Status: Ambulatory Discharge Destination:  Home Transportation: Private Auto Accompanied By: self Schedule Follow-up Appointment: Yes Clinical Summary of Care: Patient Declined Electronic Signature(s) Signed: 04/11/2019 4:21:35 PM By: [name] EMT/HBOT Entered By: [name] on 04/11/2019 13:08:58 -------------------------------------------------------------------------------- Patient/Caregiver Education Details Patient Name: Date of Service: [place], [name] 1/7/2021andnbsp10:00 AM Medical Record Number:[id number] Patient Account Number: [id number] Date of Birth/Gender: Treating RN: 1985/11/27 (34 y.o. M) Primary Care Physician: Katy Apo Other Clinician: [name] Referring Physician: Treating Physician/Extender:Robson, Almira Coaster, Chalmers Guest in Treatment: 12 Education Assessment Education Provided To: Patient Education Topics Provided Hyperbaric Oxygenation: Methods: Explain/Verbal Responses: State content correctly Electronic Signature(s) Signed: 04/11/2019 4:21:35 PM By: [name] EMT/HBOT Entered By: [name] on 04/11/2019 13:08:10 -------------------------------------------------------------------------------- Vitals Details Patient Name: Date of Service: [place], [name] 04/11/2019 10:00 AM Medical Record Number:[id number] Patient Account Number: [id number] Date of Birth/Sex: Treating RN: 1985/10/10 (34 y.o. M) Primary Care Banner Huckaba: Katy Apo Other Clinician: [name] Referring Forrestine Lecrone: Treating Kensie Susman/Extender:Robson, Almira Coaster, Chalmers Guest in Treatment: 12 Vital Signs Time Taken: 10:10 Temperature (F): 97.6 Height (in): 77 Pulse (bpm): 92 Weight (lbs): 260 Respiratory Rate (breaths/min): 15 Body Mass Index (BMI): 30.8 Blood Pressure (mmHg): 161/81 Capillary Blood Glucose (mg/dl): 539 Reference Range: 80 - 120 mg / dl Electronic Signature(s) Signed: 04/11/2019 4:21:35 PM By: [name] EMT/HBOT Entered By: [name] on 04/11/2019  11:26:46

## 2019-04-12 ENCOUNTER — Other Ambulatory Visit: Payer: Self-pay

## 2019-04-12 ENCOUNTER — Encounter (HOSPITAL_BASED_OUTPATIENT_CLINIC_OR_DEPARTMENT_OTHER): Payer: BC Managed Care – PPO | Admitting: Internal Medicine

## 2019-04-12 DIAGNOSIS — E11621 Type 2 diabetes mellitus with foot ulcer: Secondary | ICD-10-CM | POA: Diagnosis not present

## 2019-04-12 LAB — GLUCOSE, CAPILLARY
Glucose-Capillary: 305 mg/dL — ABNORMAL HIGH (ref 70–99)
Glucose-Capillary: 304 mg/dL — ABNORMAL HIGH (ref 70–99)

## 2019-04-12 NOTE — Progress Notes (Signed)
[place], [name] ([id number]) Visit Report for 04/12/2019 Arrival Information Details Patient Name: Date of Service: [name], [name] 04/12/2019 10:00 AM Medical Record Number:[id number] Patient Account Number: [id number] Date of Birth/Sex: Treating RN: 1986/01/18 (33 y.o. M) Primary Care Merial Moritz: Katy Apo Other Clinician: [name] Referring Nefi Musich: Treating Delisia Mcquiston/Extender:Robson, Almira Coaster, Chalmers Guest in Treatment: 13 Visit Information History Since Last Visit Added or deleted any medications: No Patient Arrived: Ambulatory Any new allergies or adverse reactions: No Arrival Time: 10:10 Had a fall or experienced change in No Accompanied By: self activities of daily living that may affect Transfer Assistance: None risk of falls: Patient Identification Verified: Yes Signs or symptoms of abuse/neglect since last No Secondary Verification Process Yes visito Completed: Hospitalized since last visit: No Patient Requires Transmission-Based No Implantable device outside of the clinic excluding No Precautions: cellular tissue based products placed in the center Patient Has Alerts: No since last visit: Pain Present Now: No Electronic Signature(s) Signed: 04/12/2019 3:20:21 PM By: [name] EMT/HBOT Entered By: [name] on 04/12/2019 10:43:34 -------------------------------------------------------------------------------- Encounter Discharge Information Details Patient Name: Date of Service: [name] 04/12/2019 10:00 AM Medical Record Number:[id number] Patient Account Number: [id number] Date of Birth/Sex: Treating RN: May 05, 1985 (33 y.o. M) Primary Care Samina Weekes: Katy Apo Other Clinician: [name] Referring Ishi Danser: Treating Dianelly Ferran/Extender:Robson, Almira Coaster, Chalmers Guest in Treatment: 13 Encounter Discharge Information Items Discharge Condition: Stable Ambulatory Status: Ambulatory Discharge Destination:  Home Transportation: Private Auto Accompanied By: self Schedule Follow-up Appointment: Yes Clinical Summary of Care: Patient Declined Electronic Signature(s) Signed: 04/12/2019 3:20:21 PM By: [name] EMT/HBOT Entered By: [name] on 04/12/2019 13:14:24 -------------------------------------------------------------------------------- Patient/Caregiver Education Details Patient Name: Date of Service: [name] 1/8/2021andnbsp10:00 AM Medical Record Number:[id number] Patient Account Number: [id number] Date of Birth/Gender: Treating RN: 22-May-1985 (33 y.o. M) Primary Care Physician: Katy Apo Other Clinician: [name] Referring Physician: Treating Physician/Extender:Robson, Almira Coaster, Chalmers Guest in Treatment: 13 Education Assessment Education Provided To: Patient Education Topics Provided Hyperbaric Oxygenation: Methods: Explain/Verbal Responses: State content correctly Electronic Signature(s) Signed: 04/12/2019 3:20:21 PM By: [name] EMT/HBOT Entered By: [name] on 04/12/2019 13:14:10 -------------------------------------------------------------------------------- Vitals Details Patient Name: Date of Service: [name] 04/12/2019 10:00 AM Medical Record Number:[id number] Patient Account Number: [id number] Date of Birth/Sex: Treating RN: [birth year] (33 y.o. M) Primary Care Heith Haigler: Katy Apo Other Clinician: [name] Referring Imari Reen: Treating Shatoya Roets/Extender:Robson, Almira Coaster, Chalmers Guest in Treatment: 13 Vital Signs Time Taken: 10:15 Temperature (F): 98 Height (in): 77 Pulse (bpm): 95 Weight (lbs): 260 Respiratory Rate (breaths/min): 18 Body Mass Index (BMI): 30.8 Blood Pressure (mmHg): 149/91 Capillary Blood Glucose (mg/dl): 629 Reference Range: 80 - 120 mg / dl Notes Tanveer Dobberstein made aware of pts elevated CBG Electronic Signature(s) Signed: 04/12/2019 3:20:21 PM By: [name]  EMT/HBOT Entered By: [name] on 04/12/2019 10:44:09

## 2019-04-12 NOTE — Progress Notes (Signed)
[name], [name] ([id number]) Visit Report for 04/11/2019 Debridement Details Patient Name: Date of Service: [name], [name] 04/11/2019 4:00 PM Medical Record [id number] Patient Account Number: [id number] Date of Birth/Sex: Treating RN: 08-Mar-1986 (34 y.o. [name] Primary Care Provider: [name] Other Clinician: Referring Provider: Treating Provider/Extender:Nashid Pellum, [name], [name]: 12 Debridement Performed for Wound #1 Left Metatarsal head fifth Assessment: Performed By: Physician [name], MD Debridement Type: Debridement Severity of Tissue Pre Fat layer exposed Debridement: Level of Consciousness (Pre- Awake and Alert procedure): Pre-procedure Verification/Time Out Taken: Yes - 16:40 Start Time: 16:41 Pain Control: Lidocaine 4% Topical Solution Total Area Debrided (L x W): 2.5 (cm) x 2.8 (cm) = 7 (cm) Tissue and other material Viable, Non-Viable, Callus, Slough, Subcutaneous, Skin: Dermis , Fibrin/Exudate, debrided: Slough Level: Skin/Subcutaneous Tissue Debridement Description: Excisional Instrument: Curette Bleeding: Moderate Hemostasis Achieved: Pressure End Time: 16:44 Procedural Pain: 0 Post Procedural Pain: 0 Response to Treatment: Procedure was tolerated well Level of Consciousness Awake and Alert (Post-procedure): Post Debridement Measurements of Total Wound Length: (cm) 2.6 Width: (cm) 2.6 Depth: (cm) 0.6 Volume: (cm) 3.186 Character of Wound/Ulcer Post Improved Debridement: Severity of Tissue Post Debridement: Fat layer exposed Post Procedure Diagnosis Same as Pre-procedure Electronic Signature(s) Signed: 04/11/2019 5:19:45 PM By: Deon Pilling Signed: 04/12/2019 4:55:01 AM By: [name] MD Entered By: Deon Pilling on 04/11/2019 16:45:24 -------------------------------------------------------------------------------- HPI Details Patient Name: Date of Service: [name] 04/11/2019  4:00 PM Medical Record Number:[id number] Patient Account Number: [id number] Date of Birth/Sex: Treating RN: 21-Mar-1986 (34 y.o. 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 He comes in for review of a wound over the plantar fifth metatarsal head extending into the lateral part of the foot. He was followed for this previously by his podiatrist Dr. Cornelius Moras. As the patient tells his story he went to see podiatry first for a swelling he developed on the lateral part of his fifth metatarsal head in May. He states this was "open" by podiatry and the area closed. He was followed up in June and it was again opened callus removed and it closed promptly. There were plans being made for surgery on the fifth metatarsal head in June however his blood sugar was apparently too high for anesthesia. Apparently the area was debrided and opened again in June and it is never closed since. Looking over the records from podiatry I am really not able to follow this. It was clear when he was first seen it was before 5/14 at that point he already had a wound. By 5/17 the ulcer was resolved. I do not see anything about a procedure. On 5/28 noted to have pre-ulcerative moderate keratosis. X-ray noted 1/5 contracted toe and tailor's bunion and metatarsal deformity. On a visit date on 09/28/2018 the dorsal part of the left foot it healed and resolved. There was concern about swelling in his lower extremity he was sent to the ER.. As far as I can tell he was seen in the ER on 7/12 with an ulcer on his left foot. A DVT rule out of the left leg was negative. I do not think I have complete records from podiatry but I am not able to verify the procedures this patient  states he had. He states after the last procedure  the wound has never closed although I am not able to follow this in the records I have from podiatry. He has not had a recent x-ray The patient has been using Neosporin on the wound. He is wearing a Darco shoe. He is still very active up on his foot working and exercising. Past medical history; type 2 diabetes ketosis-prone, leg swelling with a negative DVT study in July. Non-smoker ABI in our clinic was 0.85 on the left 10/16; substantial wound on the plantar left fifth met head extending laterally almost to the dorsal fifth MTP. We have been using silver alginate we gave him a Darco forefoot off loader. An x-ray did not show evidence of osteomyelitis did note soft tissue emphysema which I think was due to gas tracking through an open wound. There is no doubt in my mind he requires an MRI 10/23; MRI not booked until 3 November at the earliest this is largely due to his glucose sensor in the right arm. We have been using silver alginate. There has been an improvement 10/29; I am still not exactly sure when his MRI is booked for. He says it is the third but it is the 10th in epic. This definitely needs to be done. He is running a low-grade fever today but no other symptoms. No real improvement in the 1 02/26/2019 patient presents today for a follow-up visit here in our clinic he is last been seen in the clinic on October 29. Subsequently we were working on getting MRI to evaluate and see what exactly was going on and where we would need to go from the standpoint of whether or not he had osteomyelitis and again what treatments were going be required. Subsequently the patient ended up being admitted to the hospital on 02/07/2019 and was discharged on 02/14/2019. This is a somewhat interesting admission with a discharge diagnosis of pneumonia due to COVID-19 although he was positive for COVID-19 when tested at the urgent care but negative x2 when he  was actually in the hospital. With that being said he did have acute respiratory failure with hypoxia and it was noted he also have a left foot ulceration with osteomyelitis. With that being said he did require oxygen for his pneumonia and I level 4 L. He was placed on antivirals and steroids for the COVID-19. He was also transferred to the [place] at one point. Nonetheless he did subsequently discharged home and since being home has done much better in that regard. The CT angiogram did not show any pulmonary embolism. With regard to the osteomyelitis the patient was placed on vancomycin and Zosyn while in the hospital but has been changed to Augmentin at discharge. It was also recommended that he follow-up with wound care and podiatry. Podiatry however wanted him to see [name] according to the patient prior to them doing anything further. His hemoglobin A1c was 9.9 as noted in the hospital. Have an MRI of the left foot performed while in the hospital on 02/04/2019. 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 There is an overlying plantar open wound noted an abscess tracking back along the lateral aspect of the fifth metatarsal shaft. There is otherwise diffuse cellulitis and mild fasciitis without findings of polymyositis. The patient did have recently pneumonia secondary to COVID-19 I looked in the chart through epic and it does appear that the patient may need to have an additional x-ray  just to ensure everything is cleared and that he has no airspace disease prior to putting him into the chamber. 03/05/2019; patient was readmitted to the clinic last week. He was hospitalized twice for a viral upper respiratory tract infection from 11/1 through 11/4 and then 11/5 through 11/12 ultimately this turned out to be Covid pneumonitis. Although he was discharged on oxygen he is not using it. He says he  feels fine. He has no exercise limitation no cough no sputum. His O2 sat in our clinic today was 100% on room air. 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 He received Vanco and Zosyn in the hospital and then was discharged on 2 weeks of Augmentin. I do not see any relevant cultures. He was supposed to follow-up with infectious disease but I do not see that he has an appointment. 12/8; patient saw Dr. Novella Olive of infectious disease last week. He felt that he had had adequate antibiotic therapy. He did not go to follow-up with Dr. Amalia Hailey of podiatry and I have again talked to him about the pros and cons of this. He does not want to consider a ray amputation of this time. He is aware of the risks of recurrence, migration etc. He started HBO today and tolerated this well. He can complete the Augmentin that I gave him last week. I have looked over the lab work that Dr. Chana Bode ordered his C-reactive protein was 3.3 and his sedimentation rate was 17. The C-reactive protein is never really been measurably that high in this patient 12/15; not much change in the wound today however he has undermining along the lateral part of the foot again more extensively than last week. He has some rims of epithelialization. We have been using silver alginate. He is undergoing hyperbarics but did not dive today 12/18; in for his obligatory first total contact cast change. Unfortunately there was pus coming from the undermining area around his fifth metatarsal head. This was cultured but will preclude reapplication of a cast. He is seen in conjunction with HBO 12/24; patient had staph lugdunensis in the wound in the undermining area laterally last time. We put him on doxycycline which should have covered this. The wound looks better today. I am going to give him another week of doxycycline before  reattempting the total contact cast 12/31; the patient is completing antibiotics. Hemorrhagic debris in the distal part of the wound with some undermining distally. He also had hyper granulation. Extensive debridement with a #5 curette. The infected area that was on the lateral part of the fifth met head is closed over. I do not think he needs any more antibiotics. Patient was seen prior to HBO. Preparations for a total contact cast were made in the cast will be placed post hyperbarics 04/11/19; once again the patient arrives today without complication. He had been in a cast all week noted that he had heavy drainage this week. This resulted in large raised areas of macerated tissue around the wound Electronic Signature(s) Signed: 04/12/2019 4:55:01 AM By: [name] MD Entered By: [name] on 04/11/2019 20:43:06 -------------------------------------------------------------------------------- Physical Exam Details Patient Name: Date of Service: Thorpe, [name] 04/11/2019 4:00 PM Medical Record [id number] Patient Account Number: [id number] Date of Birth/Sex: Treating RN: 08-17-1985 (34 y.o. M) Primary Care Provider: [name] Other Clinician: Referring Provider: Treating Provider/Extender:Jashay Roddy, [name], [name]: 12 Notes  wound exam; the patient appeared to have more undermining medially. The lateral undermining area over the lateral fifth met head as resolved. Very macerated skin around the wound. No clear evidence of infection in spite of the drainage. Electronic Signature(s) Signed: 04/12/2019 4:55:01 AM By: [name] MD Entered By: [name] on 04/11/2019 20:44:10 -------------------------------------------------------------------------------- Physician Orders Details Patient Name: Date of Service: Criger, [name] 04/11/2019 4:00 PM Medical Record [id number] Patient Account Number: [id number] Date of Birth/Sex: Treating  RN: Nov 26, 1985 (34 y.o. 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 - Thursday Dressing Change Frequency Wound #1 Left Metatarsal head fifth Change dressing every day. Skin Barriers/Peri-Wound Care Skin Prep Wound Cleansing Wound #1 Left Metatarsal head fifth May shower with protection. Primary Wound Dressing Wound #1 Left Metatarsal head fifth Hydrofera Blue Secondary Dressing Wound #1 Left Metatarsal head fifth Kerlix/Rolled Gauze Dry Gauze Other: - felt Off-Loading Wedge shoe to: - left foot. Electronic Signature(s) Signed: 04/11/2019 5:19:45 PM By: Deon Pilling Signed: 04/12/2019 4:55:01 AM By: [name] MD Entered By: Deon Pilling on 04/11/2019 16:47:04 -------------------------------------------------------------------------------- Problem List Details Patient Name: Date of Service: Paone, [name] 04/11/2019 4:00 PM Medical Record [id number] Patient Account Number: [id number] Date of Birth/Sex: Treating RN: Nov 23, 1985 (33 y.o. 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, left ankle and foot 03/05/2019 No Yes M00.9 Pyogenic arthritis, unspecified 03/05/2019 No Yes Inactive Problems Resolved Problems Electronic Signature(s) Signed: 04/12/2019 4:55:01 AM By: [name] MD Previous Signature: 04/11/2019 5:19:45 PM Version By: Deon Pilling Entered By: [name] on 04/11/2019 20:36:05 -------------------------------------------------------------------------------- Progress Note Details Patient Name: Date of Service: Froning, [name] 04/11/2019 4:00 PM Medical Record [id number] Patient Account Number: [id number] Date of Birth/Sex: Treating RN: 1985/12/30 (34 y.o. 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 He comes in for review of a wound over the plantar fifth metatarsal head extending into the lateral part of the foot. He was followed for this previously by his podiatrist Dr. Cornelius Moras. As the patient tells his story he went to see podiatry first for a swelling he developed on the lateral part of his fifth metatarsal head in May. He states this was "open" by podiatry and the area closed. He was followed up in June and it was again opened callus removed and it closed promptly. There were plans being made for surgery on the fifth metatarsal head in June however his blood sugar was apparently too high for anesthesia. Apparently the area was  debrided and opened again in June and it is never closed since. Looking over the records from podiatry I am really not able to follow this. It was clear when he was first seen it  was before 5/14 at that point he already had a wound. By 5/17 the ulcer was resolved. I do not see anything about a procedure. On 5/28 noted to have pre-ulcerative moderate keratosis. X-ray noted 1/5 contracted toe and tailor's bunion and metatarsal deformity. On a visit date on 09/28/2018 the dorsal part of the left foot it healed and resolved. There was concern about swelling in his lower extremity he was sent to the ER.. As far as I can tell he was seen in the ER on 7/12 with an ulcer on his left foot. A DVT rule out of the left leg was negative. I do not think I have complete records from podiatry but I am not able to verify the procedures this patient states he had. He states after the last procedure the wound has never closed although I am not able to follow this in the records I have from podiatry. He has not had a recent x-ray The patient has been using Neosporin on the wound. He is wearing a Darco shoe. He is still very active up on his foot working and exercising. Past medical history; type 2 diabetes ketosis-prone, leg swelling with a negative DVT study in July. Non-smoker ABI in our clinic was 0.85 on the left 10/16; substantial wound on the plantar left fifth met head extending laterally almost to the dorsal fifth MTP. We have been using silver alginate we gave him a Darco forefoot off loader. An x-ray did not show evidence of osteomyelitis did note soft tissue emphysema which I think was due to gas tracking through an open wound. There is no doubt in my mind he requires an MRI 10/23; MRI not booked until 3 November at the earliest this is largely due to his glucose sensor in the right arm. We have been using silver alginate. There has been an improvement 10/29; I am still not exactly sure when his MRI is booked for. He says it is the third but it is the 10th in epic. This definitely needs to be done. He is running a low-grade fever today but no other symptoms. No real improvement  in the 1 02/26/2019 patient presents today for a follow-up visit here in our clinic he is last been seen in the clinic on October 29. Subsequently we were working on getting MRI to evaluate and see what exactly was going on and where we would need to go from the standpoint of whether or not he had osteomyelitis and again what treatments were going be required. Subsequently the patient ended up being admitted to the hospital on 02/07/2019 and was discharged on 02/14/2019. This is a somewhat interesting admission with a discharge diagnosis of pneumonia due to COVID-19 although he was positive for COVID-19 when tested at the urgent care but negative x2 when he was actually in the hospital. With that being said he did have acute respiratory failure with hypoxia and it was noted he also have a left foot ulceration with osteomyelitis. With that being said he did require oxygen for his pneumonia and I level 4 L. He was placed on antivirals and steroids for the COVID-19. He was also transferred to the [facility] at one point. Nonetheless he did subsequently discharged home and since being home has done much better in  that regard. The CT angiogram did not show any pulmonary embolism. With regard to the osteomyelitis the patient was placed on vancomycin and Zosyn while in the hospital but has been changed to Augmentin at discharge. It was also recommended that he follow-up with wound care and podiatry. Podiatry however wanted him to see [name] according to the patient prior to them doing anything further. His hemoglobin A1c was 9.9 as noted in the hospital. Have an MRI of the left foot performed while in the hospital on 02/04/2019. 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 There is an overlying plantar open wound noted an abscess tracking back along the lateral aspect of the fifth metatarsal shaft. There is  otherwise diffuse cellulitis and mild fasciitis without findings of polymyositis. The patient did have recently pneumonia secondary to COVID-19 I looked in the chart through epic and it does appear that the patient may need to have an additional x-ray just to ensure everything is cleared and that he has no airspace disease prior to putting him into the chamber. 03/05/2019; patient was readmitted to the clinic last week. He was hospitalized twice for a viral upper respiratory tract infection from 11/1 through 11/4 and then 11/5 through 11/12 ultimately this turned out to be Covid pneumonitis. Although he was discharged on oxygen he is not using it. He says he feels fine. He has no exercise limitation no cough no sputum. His O2 sat in our clinic today was 100% on room air. 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 He received Vanco and Zosyn in the hospital and then was discharged on 2 weeks of Augmentin. I do not see any relevant cultures. He was supposed to follow-up with infectious disease but I do not see that he has an appointment. 12/8; patient saw Dr. Novella Olive of infectious disease last week. He felt that he had had adequate antibiotic therapy. He did not go to follow-up with Dr. Amalia Hailey of podiatry and I have again talked to him about the pros and cons of this. He does not want to consider a ray amputation of this time. He is aware of the risks of recurrence, migration etc. He started HBO today and tolerated this well. He can complete the Augmentin that I gave him last week. I have looked over the lab work that Dr. Chana Bode ordered his C-reactive protein was 3.3 and his sedimentation rate was 17. The C-reactive protein is never really been measurably that high in this patient 12/15; not much change in the wound today however he has undermining along the lateral part of the foot  again more extensively than last week. He has some rims of epithelialization. We have been using silver alginate. He is undergoing hyperbarics but did not dive today 12/18; in for his obligatory first total contact cast change. Unfortunately there was pus coming from the undermining area around his fifth metatarsal head. This was cultured but will preclude reapplication of a cast. He is seen in conjunction with HBO 12/24; patient had staph lugdunensis in the wound in the undermining area laterally last time. We put him on doxycycline which should have covered this. The wound looks better today. I am going to give him another week of doxycycline before reattempting the total contact cast 12/31; the patient is completing antibiotics. Hemorrhagic debris in the  distal part of the wound with some undermining distally. He also had hyper granulation. Extensive debridement with a #5 curette. The infected area that was on the lateral part of the fifth met head is closed over. I do not think he needs any more antibiotics. Patient was seen prior to HBO. Preparations for a total contact cast were made in the cast will be placed post hyperbarics 04/11/19; once again the patient arrives today without complication. He had been in a cast all week noted that he had heavy drainage this week. This resulted in large raised areas of macerated tissue around the wound Objective Constitutional Vitals Time Taken: 4:08 PM, Height: 77 in, Weight: 260 lbs, BMI: 30.8, Temperature: 97.6 F, Pulse: 102 bpm, Respiratory Rate: 18 breaths/min, Blood Pressure: 135/61 mmHg, Capillary Blood Glucose: 239 mg/dl. Integumentary (Hair, Skin) Wound #1 status is Open. Original cause of wound was Trauma. The wound is located on the Left Metatarsal head fifth. The wound measures 2.3cm length x 2.6cm width x 0.6cm depth; 4.697cm^2 area and 2.818cm^3 volume. There is Fat Layer (Subcutaneous Tissue) Exposed exposed. There is undermining  starting at 6:00 and ending at 3:00 with a maximum distance of 0.8cm. There is a medium amount of serosanguineous drainage noted. The wound margin is well defined and not attached to the wound base. There is large (67-100%) pink granulation within the wound bed. There is a small (1-33%) amount of necrotic tissue within the wound bed including Adherent Slough. 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 There was a Excisional Skin/Subcutaneous Tissue Debridement with a total area of 7 sq cm performed by [name], MD. With the following instrument(s): Curette to remove Viable and Non-Viable tissue/material. Material removed includes Callus, Subcutaneous Tissue, Slough, Skin: Dermis, and Fibrin/Exudate after achieving pain control using Lidocaine 4% Topical Solution. A time out was conducted at 16:40, prior to the start of the procedure. A Moderate amount of bleeding was controlled with Pressure. The procedure was tolerated well with a pain level of 0 throughout and a pain level of 0 following the procedure. Post Debridement Measurements: 2.6cm length x 2.6cm width x 0.6cm depth; 3.186cm^3 volume. Character of Wound/Ulcer Post Debridement is improved. Severity of Tissue Post Debridement is: Fat layer exposed. Post procedure Diagnosis Wound #1: Same as Pre-Procedure Plan Follow-up Appointments: Return Appointment in 1 week. - Thursday Dressing Change Frequency: Wound #1 Left Metatarsal head fifth: Change dressing every day. Skin  Barriers/Peri-Wound Care: Skin Prep Wound Cleansing: Wound #1 Left Metatarsal head fifth: May shower with protection. Primary Wound Dressing: Wound #1 Left Metatarsal head fifth: Hydrofera Blue Secondary Dressing: Wound #1 Left Metatarsal head fifth: Kerlix/Rolled Gauze Dry Gauze Other: - felt Off-Loading: Wedge shoe to: - left foot. #1 I took him out of the total contact cast today #2 aggressive debridement removing the macerated tissue from around the wound edge and fair minus debris from the surface. Once again I see no evidence of infection. #3use Hydrofera Blue as the primary dressing he will be changing this himself. Electronic Signature(s) Signed: 04/12/2019 4:55:01 AM By: [name] MD Entered By: [name] on 04/11/2019  20:45:06 -------------------------------------------------------------------------------- SuperBill Details Patient Name: Date of Service: Robeck, [name] 04/11/2019 Medical Record [id number] Patient Account Number: [id number] Date of Birth/Sex: Treating RN: [birth year] (34 y.o. 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, unspecified Facility Procedures CPT4 Code Description: [id number] 11042 - DEB SUBQ TISSUE 20 SQ CM/< ICD-10 Diagnosis Description L97.521 Non-pressure chronic ulcer of other part of left foot limited E11.621 Type 2 diabetes mellitus with foot ulcer Modifier: to breakdown Quantity: 1 of skin Physician Procedures CPT4 Code Description: [id number] 95093 - WC PHYS SUBQ  TISS 20 SQ CM ICD-10 Diagnosis Description L97.521 Non-pressure chronic ulcer of other part of left foot lim E11.621 Type 2 diabetes mellitus with foot ulcer Modifier: ited to breakdo Quantity: 1 wn of skin Electronic Signature(s) Signed: 04/12/2019 4:55:01 AM By: [name] MD Previous Signature: 04/11/2019 5:19:45 PM Version By: Deon Pilling Entered By: [name] on 04/11/2019 20:45:32

## 2019-04-12 NOTE — Progress Notes (Addendum)
[name], [name] ([id number]) Visit Report for 04/12/2019 HBO Details Patient Name: Date of Service: [name], [name] 04/12/2019 10:00 AM Medical Record [id number] Patient Account Number: [id number] Date of Birth/Sex: Treating RN: 07-16-1985 (34 y.o. M) Primary Care Zeric Baranowski: [name] Other Clinician: [name] Referring Christinamarie Tall: Treating Rik Wadel/Extender:Robson, [name], [name]: 13 HBO Treatment Course Details Treatment Course Number: 1 Ordering Ronne Savoia: [name] Total Treatments Ordered: 40 HBO Treatment Start Date: 03/12/2019 HBO Indication: Diabetic Ulcer(s) of the Lower Extremity HBO Treatment Details Treatment Number: 18 Patient Type: Outpatient Chamber Type: Monoplace Chamber Serial #: [id number] Treatment Protocol: 2.0 ATA with 90 minutes oxygen, and no air breaks Treatment Details Compression Rate Down: 2.0 psi / minute De-Compression Rate Up: 2.0 psi / minute Air breaks and CompressTx Pressure breathing DecompressDecompress Begins Reached periods Begins Ends (leave unused spaces blank) Chamber Pressure (ATA)1 2 ------2 1 Clock Time (24 hr) 10:26 10:34 - - - - - - 12:04 12:12 Treatment Length: 106 (minutes) Treatment Segments: 4 Vital Signs Capillary Blood Glucose Reference Range: 80 - 120 mg / dl HBO Diabetic Blood Glucose Intervention Range: <131 mg/dl or >249 mg/dl Time Vitals Blood Respiratory Capillary Blood Glucose Pulse Action Type: Pulse: Temperature: Taken: Pressure: Rate: Glucose (mg/dl): Meter #: Oximetry (%) Taken: Pre 10:15 149/91 95 18 98 305 Post 12:15 161/88 83 15 98.3 304 Treatment Response Treatment Toleration: Well Treatment Completion Treatment Completed without Adverse Event Status: Additional Procedure Documentation Tissue Sevierity: Necrosis of bone Shamiyah Ngu Notes No concerns with treatment given Physician HBO Attestation: I certify that I supervised this HBO treatment  in accordance with Medicare guidelines. A trained Yes emergency response team is readily available per hospital policies and procedures. Continue HBOT as ordered. Yes Electronic Signature(s) Signed: 04/12/2019 4:35:49 PM By: [name] MD Previous Signature: 04/12/2019 3:20:21 PM Version By: [name] EMT/HBOT Entered By: [name] on 04/12/2019 15:49:17 -------------------------------------------------------------------------------- HBO Safety Checklist Details Patient Name: Date of Service: [name], [name] 04/12/2019 10:00 AM Medical Record [id number] Patient Account Number: [id number] Date of Birth/Sex: Treating RN: 11-29-1985 (34 y.o. M) Primary Care Corneilus Heggie: [name] Other Clinician: [name] Referring Charice Zuno: Treating Jeannemarie Sawaya/Extender:Robson, [name], [name]: 13 HBO Safety Checklist Items Safety Checklist Consent Form Signed Patient voided / foley secured and emptied When did you last eato 0900 - ensure Last dose of injectable or oral agent insulin NA Ostomy pouch emptied and vented if applicable All implantable devices assessed, documented and approved NA Intravenous access site secured and place Valuables secured Linens and cotton and cotton/polyester blend (less than 51% polyester) Personal oil-based products / skin lotions / body lotions removed NA Wigs or hairpieces removed NA Smoking or tobacco materials removed Books / newspapers / magazines / loose paper removed Cologne, aftershave, perfume and deodorant removed Jewelry removed (may wrap wedding band) NA Make-up removed Hair care products removed NA Battery operated devices (external) removed NA Heating patches and chemical warmers removed NA Titanium eyewear removed NA Nail polish cured greater than 10 hours Casting material cured greater than 10 hours NA Hearing aids removed NA Loose dentures or partials removed NA Prosthetics have been  removed Patient demonstrates correct use of air break device (if applicable) Patient concerns have been addressed Patient grounding bracelet on and cord attached to chamber Specifics for Inpatients (complete in addition to above) Medication sheet sent with patient Intravenous medications needed or due during therapy sent with patient Drainage tubes (e.g. nasogastric tube or chest tube secured and vented) Endotracheal or  Tracheotomy tube secured Cuff deflated of air and inflated with saline Airway suctioned Electronic Signature(s) Signed: 04/12/2019 10:44:53 AM By: [name] EMT/HBOT Entered By: [name] on 04/12/2019 10:44:52

## 2019-04-12 NOTE — Progress Notes (Signed)
Trompeter, [name] ([id number]) Visit Report for 04/11/2019 SuperBill Details Patient Name: Date of Service: [name], [name] 04/11/2019 Medical Record Number:[id number] Patient Account Number: [id number] Date of Birth/Sex: Treating RN: November 03, 1985 (34 y.o. 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

## 2019-04-12 NOTE — Progress Notes (Signed)
Brotzman, [name] ([id number]) Visit Report for 04/12/2019 SuperBill Details Patient Name: Date of Service: [name], [name] 04/12/2019 Medical Record Number:[id number] Patient Account Number: [id number] Date of Birth/Sex: Treating RN: 1985/12/23 (34 y.o. 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

## 2019-04-15 ENCOUNTER — Other Ambulatory Visit: Payer: Self-pay

## 2019-04-15 ENCOUNTER — Encounter (HOSPITAL_BASED_OUTPATIENT_CLINIC_OR_DEPARTMENT_OTHER): Payer: BC Managed Care – PPO | Admitting: Internal Medicine

## 2019-04-15 DIAGNOSIS — E11621 Type 2 diabetes mellitus with foot ulcer: Secondary | ICD-10-CM | POA: Diagnosis not present

## 2019-04-15 LAB — GLUCOSE, CAPILLARY
Glucose-Capillary: 137 mg/dL — ABNORMAL HIGH (ref 70–99)
Glucose-Capillary: 98 mg/dL (ref 70–99)

## 2019-04-15 NOTE — Progress Notes (Signed)
Rahimi, [name] ([id number]) Visit Report for 04/15/2019 Arrival Information Details Patient Name: Date of Service: [name], [name] 04/15/2019 10:00 AM Medical Record Number:[id number] Patient Account Number: [id number] Date of Birth/Sex: Treating RN: [birth year] (34 y.o. M) Primary Care Maximina Pirozzi: Katy Apo Other Clinician: [name] Referring Luann Aspinwall: Treating Roseanne Juenger/Extender:Robson, Almira Coaster, Chalmers Guest in Treatment: 13 Visit Information History Since Last Visit Added or deleted any medications: No Patient Arrived: Ambulatory Any new allergies or adverse reactions: No Arrival Time: 10:15 Had a fall or experienced change in No Accompanied By: self activities of daily living that may affect Transfer Assistance: None risk of falls: Patient Identification Verified: Yes Signs or symptoms of abuse/neglect since last No Secondary Verification Process Yes visito Completed: Hospitalized since last visit: No Patient Requires Transmission-Based No Implantable device outside of the clinic excluding No Precautions: cellular tissue based products placed in the center Patient Has Alerts: No since last visit: Pain Present Now: No Electronic Signature(s) Signed: 04/15/2019 4:37:37 PM By: [name] EMT/HBOT Entered By: [name] on 04/15/2019 10:55:01 -------------------------------------------------------------------------------- Encounter Discharge Information Details Patient Name: Date of Service: [name], [name] 04/15/2019 10:00 AM Medical Record Number:[id number] Patient Account Number: [id number] Date of Birth/Sex: Treating RN: 08-11-1985 (34 y.o. M) Primary Care Kaziyah Parkison: Katy Apo Other Clinician: [name] Referring Jonthan Leite: Treating Lucion Dilger/Extender:Robson, Almira Coaster, Chalmers Guest in Treatment: 13 Encounter Discharge Information Items Discharge Condition: Stable Ambulatory Status: Ambulatory Discharge Destination:  Home Transportation: Private Auto Accompanied By: self Schedule Follow-up Appointment: Yes Clinical Summary of Care: Patient Declined Electronic Signature(s) Signed: 04/15/2019 4:37:37 PM By: [name] EMT/HBOT Entered By: [name] on 04/15/2019 12:31:28 -------------------------------------------------------------------------------- Patient/Caregiver Education Details Patient Name: [name], [name] 1/11/2021andnbsp10:00 Date of Service: AM Medical Record [id number] Number: Patient Account Number: [id number] Treating RN: [birth year] (34 y.o. Date of Birth/Gender: M) Other Clinician: [name] Primary Care Physician: Katy Apo Treating Baltazar Najjar Referring Physician: Physician/Extender: Renee Ramus in Treatment: 13 Education Assessment Education Provided To: Patient Education Topics Provided Hyperbaric Oxygenation: Methods: Explain/Verbal Responses: State content correctly Electronic Signature(s) Signed: 04/15/2019 4:37:37 PM By: [name] EMT/HBOT Entered By: [name] on 04/15/2019 12:31:16 -------------------------------------------------------------------------------- Vitals Details Patient Name: Date of Service: [name], [name] 04/15/2019 10:00 AM Medical Record Number:[id number] Patient Account Number: [id number] Date of Birth/Sex: Treating RN: 1985/06/12 (34 y.o. M) Primary Care Cynthie Garmon: Katy Apo Other Clinician: [name] Referring Eretria Manternach: Treating Alba Perillo/Extender:Robson, Almira Coaster, Chalmers Guest in Treatment: 13 Vital Signs Time Taken: 10:20 Temperature (F): 98.4 Height (in): 77 Pulse (bpm): 100 Weight (lbs): 260 Respiratory Rate (breaths/min): 16 Body Mass Index (BMI): 30.8 Blood Pressure (mmHg): 149/91 Capillary Blood Glucose (mg/dl): 992 Reference Range: 80 - 120 mg / dl Electronic Signature(s) Signed: 04/15/2019 4:37:37 PM By: [name] EMT/HBOT Entered By: [name] on  04/15/2019 10:55:20

## 2019-04-15 NOTE — Progress Notes (Signed)
Lack, [name] ([id number]) Visit Report for 04/15/2019 SuperBill Details Patient Name: Date of Service: [name], [name] 04/15/2019 Medical Record Number:[id number] Patient Account Number: [id number] Date of Birth/Sex: Treating RN: [birth year] (34 y.o. 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

## 2019-04-15 NOTE — Progress Notes (Addendum)
[name] [name] ([id number]) Visit Report for 04/15/2019 HBO Details Patient Name: Date of Service: [name] [name] 04/15/2019 10:00 AM Medical Record [id number] Patient Account Number: [id number] Date of Birth/Sex: Treating RN: [name] [name] (34 y.o. M) Primary Care Alven Alverio: [name] Other Clinician: [name] Referring Tarius Stangelo: Treating [name], [name], [name]: 13 HBO Treatment Course Details Treatment Course Number: 1 Ordering Macarthur Lorusso: [name] Total Treatments Ordered: 40 HBO Treatment Start Date: 03/12/2019 HBO Indication: Diabetic Ulcer(s) of the Lower Extremity HBO Treatment Details Treatment Number: 19 Patient Type: Outpatient Chamber Type: Monoplace Chamber Serial #: [id number] Treatment Protocol: 2.0 ATA with 90 minutes oxygen, and no air breaks Treatment Details Compression Rate Down: 2.0 psi / minute De-Compression Rate Up: 2.0 psi / minute Air breaks and CompressTx Pressure breathing DecompressDecompress Begins Reached periods Begins Ends (leave unused spaces blank) Chamber Pressure (ATA)1 2 ------2 1 Clock Time (24 hr) 10:30 10:38 - - - - - - 12:08 12:16 Treatment Length: 106 (minutes) Treatment Segments: 4 Vital Signs Capillary Blood Glucose Reference Range: 80 - 120 mg / dl HBO Diabetic Blood Glucose Intervention Range: <131 mg/dl or >249 mg/dl Time Vitals Blood Respiratory Capillary Blood Glucose Pulse Action Type: Pulse: Temperature: Taken: Pressure: Rate: Glucose (mg/dl): Meter #: Oximetry (%) Taken: Pre 10:20 149/91 100 16 98.4 137 Post 12:20 151/90 85 14 98.1 98 Treatment Response Treatment Toleration: Well Treatment Completion Treatment Completed without Adverse Event Status: Additional Procedure Documentation Tissue Sevierity: Necrosis of bone Mihir Flanigan Notes No concerns with treatment given Physician HBO Attestation: I certify that I supervised this HBO treatment  in accordance with Medicare guidelines. A trained Yes emergency response team is readily available per hospital policies and procedures. Continue HBOT as ordered. Yes Electronic Signature(s) Signed: 04/15/2019 6:04:26 PM By: [name] MD Previous Signature: 04/15/2019 4:37:37 PM Version By: [name] EMT/HBOT Entered By: [name] on 04/15/2019 18:02:21 -------------------------------------------------------------------------------- HBO Safety Checklist Details Patient Name: Date of Service: [name] [name] 04/15/2019 10:00 AM Medical Record [id number] Patient Account Number: [id number] Date of Birth/Sex: Treating RN: [birth year] (34 y.o. M) Primary Care Shalon Salado: [name] Other Clinician: [name] Referring Kirsti Mcalpine: Treating Ivy Meriwether/Extender:Robson, [name], [name]: 13 HBO Safety Checklist Items Safety Checklist Consent Form Signed Patient voided / foley secured and emptied When did you last eato 0900 - Ensure Last dose of injectable or oral agent insulin NA Ostomy pouch emptied and vented if applicable All implantable devices assessed, documented and approved NA Intravenous access site secured and place Valuables secured Linens and cotton and cotton/polyester blend (less than 51% polyester) Personal oil-based products / skin lotions / body lotions removed NA Wigs or hairpieces removed NA Smoking or tobacco materials removed Books / newspapers / magazines / loose paper removed Cologne, aftershave, perfume and deodorant removed Jewelry removed (Max wrap wedding band) NA Make-up removed Hair care products removed NA Battery operated devices (external) removed NA Heating patches and chemical warmers removed NA Titanium eyewear removed NA Nail polish cured greater than 10 hours NA Casting material cured greater than 10 hours NA Hearing aids removed NA Loose dentures or partials removed NA Prosthetics have been  removed Patient demonstrates correct use of air break device (if applicable) Patient concerns have been addressed Patient grounding bracelet on and cord attached to chamber Specifics for Inpatients (complete in addition to above) Medication sheet sent with patient Intravenous medications needed or due during therapy sent with patient Drainage tubes (e.g. nasogastric tube or chest tube secured and vented) Endotracheal  or Tracheotomy tube secured Cuff deflated of air and inflated with saline Airway suctioned Electronic Signature(s) Signed: 04/15/2019 4:37:37 PM By: [name] EMT/HBOT Previous Signature: 04/15/2019 10:56:12 AM Version By: [name] EMT/HBOT Entered By: [name] on 04/15/2019 12:31:41

## 2019-04-15 NOTE — Progress Notes (Signed)
Woj[place], [name] ([id number]) Visit Report for 04/10/2019 Problem List Details Patient Name: Date of Service: [place], [name] 04/10/2019 10:00 AM Medical Record Number:[id number] Patient Account Number: [id number] Date of Birth/Sex: Treating RN: 12/09/1985 (33 y.o. 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

## 2019-04-16 ENCOUNTER — Encounter (HOSPITAL_BASED_OUTPATIENT_CLINIC_OR_DEPARTMENT_OTHER): Payer: BC Managed Care – PPO | Admitting: Internal Medicine

## 2019-04-16 ENCOUNTER — Other Ambulatory Visit: Payer: Self-pay

## 2019-04-16 DIAGNOSIS — E11621 Type 2 diabetes mellitus with foot ulcer: Secondary | ICD-10-CM | POA: Diagnosis not present

## 2019-04-16 LAB — GLUCOSE, CAPILLARY
Glucose-Capillary: 222 mg/dL — ABNORMAL HIGH (ref 70–99)
Glucose-Capillary: 201 mg/dL — ABNORMAL HIGH (ref 70–99)

## 2019-04-16 NOTE — Progress Notes (Addendum)
Mccabe, [name] ([id number]) Visit Report for 04/16/2019 HBO Details Patient Name: Date of Service: Wedeking, [name] 04/16/2019 10:00 AM Medical Record [id number] Patient Account Number: [id number] Date of Birth/Sex: Treating RN: 18-Aug-1985 (34 y.o. M) Primary Care Rajni Holsworth: [name] Other Clinician: [name] Referring Azahel Belcastro: Treating Deangelo Berns/Extender:Robson, [name], [name]: 13 HBO Treatment Course Details Treatment Course Number: 1 Ordering Izel Eisenhardt: [name] Total Treatments Ordered: 40 HBO Treatment Start Date: 03/12/2019 HBO Indication: Diabetic Ulcer(s) of the Lower Extremity HBO Treatment Details Treatment Number: 20 Patient Type: Outpatient Chamber Type: Monoplace Chamber Serial #: [id number] Treatment Protocol: 2.0 ATA with 90 minutes oxygen, and no air breaks Treatment Details Compression Rate Down: 2.0 psi / minute De-Compression Rate Up: 2.0 psi / minute Air breaks and CompressTx Pressure breathing DecompressDecompress Begins Reached periods Begins Ends (leave unused spaces blank) Chamber Pressure (ATA)1 2 ------2 1 Clock Time (24 hr) 10:23 10:31 - - - - - - 12:01 12:09 Treatment Length: 106 (minutes) Treatment Segments: 4 Vital Signs Capillary Blood Glucose Reference Range: 80 - 120 mg / dl HBO Diabetic Blood Glucose Intervention Range: <131 mg/dl or >249 mg/dl Time Vitals Blood Respiratory Capillary Blood Glucose Pulse Action Type: Pulse: Temperature: Taken: Pressure: Rate: Glucose (mg/dl): Meter #: Oximetry (%) Taken: Pre 10:10 128/99 104 16 98 222 Post 12:11 166/97 82 15 98 201 Treatment Response Treatment Toleration: Well Treatment Completion Treatment Completed without Adverse Event Status: Additional Procedure Documentation Tissue Sevierity: Necrosis of bone Manasvini Whatley Notes No concerns with treatment given Physician HBO Attestation: I certify that I supervised this HBO treatment  in accordance with Medicare guidelines. A trained Yes emergency response team is readily available per hospital policies and procedures. Continue HBOT as ordered. Yes Electronic Signature(s) Signed: 04/16/2019 6:16:53 PM By: [name] MD Previous Signature: 04/16/2019 3:22:35 PM Version By: [name] EMT/HBOT Entered By: [name] on 04/16/2019 18:13:31 -------------------------------------------------------------------------------- HBO Safety Checklist Details Patient Name: Date of Service: Stoll, [name] 04/16/2019 10:00 AM Medical Record [id number] Patient Account Number: [id number] Date of Birth/Sex: Treating RN: 1986/03/14 (34 y.o. M) Primary Care Ariadna Setter: [name] Other Clinician: [name] Referring Umaima Scholten: Treating Jakeia Carreras/Extender:Robson, [name], [name]: 13 HBO Safety Checklist Items Safety Checklist Consent Form Signed Patient voided / foley secured and emptied When did you last eato 0900 - ensure Last dose of injectable or oral agent insulin NA Ostomy pouch emptied and vented if applicable All implantable devices assessed, documented and approved NA Intravenous access site secured and place Valuables secured Linens and cotton and cotton/polyester blend (less than 51% polyester) Personal oil-based products / skin lotions / body lotions removed NA Wigs or hairpieces removed NA Smoking or tobacco materials removed Books / newspapers / magazines / loose paper removed Cologne, aftershave, perfume and deodorant removed Jewelry removed (may wrap wedding band) NA Make-up removed Hair care products removed NA Battery operated devices (external) removed NA Heating patches and chemical warmers removed NA Titanium eyewear removed NA Nail polish cured greater than 10 hours NA Casting material cured greater than 10 hours NA Hearing aids removed NA Loose dentures or partials removed NA Prosthetics have been  removed Patient demonstrates correct use of air break device (if applicable) Patient concerns have been addressed Patient grounding bracelet on and cord attached to chamber Specifics for Inpatients (complete in addition to above) Medication sheet sent with patient Intravenous medications needed or due during therapy sent with patient Drainage tubes (e.g. nasogastric tube or chest tube secured and vented) Endotracheal  or Tracheotomy tube secured Cuff deflated of air and inflated with saline Airway suctioned Electronic Signature(s) Signed: 04/16/2019 10:48:23 AM By: [name] EMT/HBOT Entered By: [name] on 04/16/2019 10:48:23

## 2019-04-16 NOTE — Progress Notes (Signed)
[name], Max ([id number]) Visit Report for 04/11/2019 Arrival Information Details Patient Name: Date of Service: [name], Max 04/11/2019 4:00 PM Medical Record Number:[id number] Patient Account Number: [id number] Date of Birth/Sex: Treating RN: Jan 03, 1986 (34 y.o. [name] Primary Care Berdie Malter: Katy Apo Other Clinician: Referring Shirely Toren: Treating Filimon Miranda/Extender:Robson, Almira Coaster, Chalmers Guest in Treatment: 12 Visit Information History Since Last Visit Added or deleted any medications: No Patient Arrived: Ambulatory Any new allergies or adverse reactions: No Arrival Time: 16:08 Had a fall or experienced change in No Accompanied By: alone activities of daily living that may affect Transfer Assistance: None risk of falls: Patient Identification Verified: Yes Signs or symptoms of abuse/neglect since No Secondary Verification Process Yes last visito Completed: Hospitalized since last visit: No Patient Requires Transmission-Based No Implantable device outside of the clinic No Precautions: excluding Patient Has Alerts: No cellular tissue based products placed in the center since last visit: Has Dressing in Place as Prescribed: Yes Has Footwear/Offloading in Place as Yes Prescribed: Left: Total Contact Cast Pain Present Now: No Electronic Signature(s) Signed: 04/16/2019 6:18:44 PM By: Zandra Abts RN, BSN Entered By: Zandra Abts on 04/11/2019 16:09:09 -------------------------------------------------------------------------------- Encounter Discharge Information Details Patient Name: Date of Service: [name], Max 04/11/2019 4:00 PM Medical Record Number:[id number] Patient Account Number: [id number] Date of Birth/Sex: Treating RN: Dec 13, 1985 (33 y.o. [name] Primary Care Rumi Taras: Katy Apo Other Clinician: Referring Carinna Newhart: Treating Jlon Betker/Extender:Robson, Almira Coaster, Chalmers Guest in Treatment: 12 Encounter  Discharge Information Items Post Procedure Vitals Discharge Condition: Stable Temperature (F): 97.6 Ambulatory Status: Ambulatory Pulse (bpm): 102 Discharge Destination: Home Respiratory Rate (breaths/min): 18 Transportation: Private Auto Blood Pressure (mmHg): 135/61 Accompanied By: self Schedule Follow-up Appointment: Yes Clinical Summary of Care: Electronic Signature(s) Signed: 04/11/2019 5:19:45 PM By: Shawn Stall Entered By: Shawn Stall on 04/11/2019 17:16:58 -------------------------------------------------------------------------------- Lower Extremity Assessment Details Patient Name: Date of Service: [name], Max 04/11/2019 4:00 PM Medical Record Number:[id number] Patient Account Number: [id number] Date of Birth/Sex: Treating RN: [birth year] (33 y.o. [name] Primary Care Terrell Shimko: Katy Apo Other Clinician: Referring Darlina Mccaughey: Treating Tauriel Scronce/Extender:Robson, Almira Coaster, Chalmers Guest in Treatment: 12 Edema Assessment Assessed: [Left: No] [Right: No] Edema: [Left: Ye] [Right: s] Calf Left: Right: Point of Measurement: 48 cm From Medial Instep 44.4 cm cm Ankle Left: Right: Point of Measurement: 12 cm From Medial Instep 30.4 cm cm Vascular Assessment Pulses: Dorsalis Pedis Palpable: [Left:Yes] Electronic Signature(s) Signed: 04/16/2019 6:18:44 PM By: Zandra Abts RN, BSN Entered By: Zandra Abts on 04/11/2019 16:24:58 -------------------------------------------------------------------------------- Multi Wound Chart Details Patient Name: Date of Service: [name], Max 04/11/2019 4:00 PM Medical Record Number:[id number] Patient Account Number: [id number] Date of Birth/Sex: Treating RN: January 13, 1986 (33 y.o. M) Primary Care Michaelene Dutan: Katy Apo Other Clinician: Referring Tangelia Sanson: Treating Merick Kelleher/Extender:Robson, Almira Coaster, Chalmers Guest in Treatment: 12 Vital Signs Height(in): 77 Capillary  Blood 239 Glucose(mg/dl): Weight(lbs): 297 Pulse(bpm): 102 Body Mass Index(BMI): 31 Blood Pressure(mmHg): 135/61 Temperature(F): 97.6 Respiratory 18 Rate(breaths/min): Photos: [1:No Photos] [N/A:N/A] Wound Location: [1:Left Metatarsal head fifth N/A] Wounding Event: [1:Trauma] [N/A:N/A] Primary Etiology: [1:Diabetic Wound/Ulcer of the N/A Lower Extremity] Comorbid History: [1:Type II Diabetes] [N/A:N/A] Date Acquired: [1:08/03/2018] [N/A:N/A] Weeks of Treatment: [1:12] [N/A:N/A] Wound Status: [1:Open] [N/A:N/A] Measurements L x W x D 2.3x2.6x0.6 [N/A:N/A] (cm) Area (cm) : [1:4.697] [N/A:N/A] Volume (cm) : [1:2.818] [N/A:N/A] % Reduction in Area: [1:-59.90%] [N/A:N/A] % Reduction in Volume: -19.90% [N/A:N/A] Starting Position 1 6 (o'clock): Ending Position 1 [1:3] (o'clock): Maximum Distance 1 [1:0.8] (cm): Undermining: [1:Yes] [N/A:N/A] Classification: [1:Grade  2] [N/A:N/A] Exudate Amount: [1:Medium] [N/A:N/A] Exudate Type: [1:Serosanguineous] [N/A:N/A] Exudate Color: [1:red, brown] [N/A:N/A] Wound Margin: [1:Well defined, not attached N/A] Granulation Amount: [1:Large (67-100%)] [N/A:N/A] Granulation Quality: [1:Pink] [N/A:N/A] Necrotic Amount: [1:Small (1-33%)] [N/A:N/A] Exposed Structures: [1:Fat Layer (Subcutaneous N/A Tissue) Exposed: Yes Fascia: No Tendon: No Muscle: No Joint: No Bone: No] Epithelialization: [1:Small (1-33%)] [N/A:N/A] Debridement: [1:Debridement - Excisional] [N/A:N/A] Pre-procedure [1:16:40] [N/A:N/A] Verification/Time Out Taken: Pain Control: [1:Lidocaine 4% Topical Solution] [N/A:N/A] Tissue Debrided: [1:Callus, Subcutaneous, Slough] [N/A:N/A] Level: [1:Skin/Subcutaneous Tissue] [N/A:N/A] Debridement Area (sq cm):7 [N/A:N/A] Instrument: [1:Curette] [N/A:N/A] Bleeding: [1:Moderate] [N/A:N/A] Hemostasis Achieved: [1:Pressure] [N/A:N/A] Procedural Pain: [1:0] [N/A:N/A] Post Procedural Pain: [1:0] [N/A:N/A] Debridement Treatment  Procedure was tolerated [N/A:N/A] Response: [1:well] Post Debridement [1:2.6x2.6x0.6] [N/A:N/A] Measurements L x W x D (cm) Post Debridement [1:3.186] [N/A:N/A] Volume: (cm) Procedures Performed: Debridement [N/A:N/A] Treatment Notes Wound #1 (Left Metatarsal head fifth) 1. Cleanse With Wound Cleanser Soap and water 2. Periwound Care Skin Prep 3. Primary Dressing Applied Hydrofera Blue 4. Secondary Dressing Dry Gauze Roll Gauze 5. Secured With Medipore tape 7. Footwear/Offloading device applied Felt/Foam Wedge shoe Electronic Signature(s) Signed: 04/12/2019 4:55:01 AM By: Baltazar Najjar MD Entered By: Baltazar Najjar on 04/11/2019 20:36:17 -------------------------------------------------------------------------------- Multi-Disciplinary Care Plan Details Patient Name: Date of Service: [name], Max 04/11/2019 4:00 PM Medical Record Number:[id number] Patient Account Number: [id number] Date of Birth/Sex: Treating RN: 10-Feb-1986 (33 y.o. [name] Primary Care Eldrick Penick: Katy Apo Other Clinician: Referring Kayin Kettering: Treating Keigen Caddell/Extender:Robson, Almira Coaster, Chalmers Guest in Treatment: 12 Active Inactive Wound/Skin Impairment Nursing Diagnoses: Impaired tissue integrity Knowledge deficit related to ulceration/compromised skin integrity Goals: Patient/caregiver will verbalize understanding of skin care regimen Date Initiated: 01/11/2019 Target Resolution Date: 05/03/2019 Goal Status: Active Ulcer/skin breakdown will have a volume reduction of 30% by week 4 Date Initiated: 01/11/2019 Date Inactivated: 02/26/2019 Target Resolution Date: 02/08/2019 Goal Status: Unmet Unmet Reason: comorbities Ulcer/skin breakdown will have a volume reduction of 50% by week 8 Date Initiated: 02/26/2019 Date Inactivated: 03/28/2019 Target Resolution Date: 04/12/2019 Unmet Reason: no change Goal Status: Unmet in wound measurement. see wound  measurment. Interventions: Assess patient/caregiver ability to obtain necessary supplies Assess patient/caregiver ability to perform ulcer/skin care regimen upon admission and as needed Assess ulceration(s) every visit Provide education on ulcer and skin care Notes: Electronic Signature(s) Signed: 04/11/2019 5:19:45 PM By: Shawn Stall Entered By: Shawn Stall on 04/11/2019 16:10:15 -------------------------------------------------------------------------------- Pain Assessment Details Patient Name: Date of Service: [name], Max 04/11/2019 4:00 PM Medical Record Number:[id number] Patient Account Number: [id number] Date of Birth/Sex: Treating RN: 09-24-1985 (33 y.o. [name] Primary Care Jayin Derousse: Katy Apo Other Clinician: Referring Roald Lukacs: Treating Jariyah Hackley/Extender:Robson, Almira Coaster, Chalmers Guest in Treatment: 12 Active Problems Location of Pain Severity and Description of Pain Patient Has Paino No Site Locations Pain Management and Medication Current Pain Management: Electronic Signature(s) Signed: 04/16/2019 6:18:44 PM By: Zandra Abts RN, BSN Entered By: Zandra Abts on 04/11/2019 16:09:38 -------------------------------------------------------------------------------- Patient/Caregiver Education Details Patient Name: Date of Service: [name], Max 1/7/2021andnbsp4:00 PM Medical Record Number:[id number] Patient Account Number: [id number] Date of Birth/Gender: Treating RN: 03/24/1986 (33 y.o. [name] Primary Care Physician: Katy Apo Other Clinician: Referring Physician: Treating Physician/Extender:Robson, Almira Coaster, Chalmers Guest in Treatment: 12 Education Assessment Education Provided To: Patient Education Topics Provided Wound/Skin Impairment: Handouts: Skin Care Do's and Dont's Methods: Explain/Verbal Responses: Reinforcements needed Electronic Signature(s) Signed: 04/11/2019 5:19:45 PM By: Shawn Stall Entered By: Shawn Stall on 04/11/2019 16:10:28 -------------------------------------------------------------------------------- Wound Assessment Details Patient Name: Date of Service: [name], Max 04/11/2019  4:00 PM Medical Record [id number] Patient Account Number: [id number] Date of Birth/Sex: Treating RN: 13-Aug-1985 (34 y.o. [name] Primary Care Alicyn Klann: [name] Other Clinician: Referring Maddi Collar: Treating Randen Kauth/Extender:Robson, [name], [name]: 12 Wound Status Wound Number: 1 Primary Diabetic Wound/Ulcer of the Lower Etiology: Extremity Wound Location: Left Metatarsal head fifth Wound Status: Open Wounding Event: Trauma Comorbid Type II Diabetes Date Acquired: 08/03/2018 History: Weeks Of Treatment: 12 Clustered Wound: No Photos Wound Measurements Length: (cm) 2.3 % Reduction in A Width: (cm) 2.6 % Reduction in V Depth: (cm) 0.6 Epithelializatio Area: (cm) 4.697 Undermining: Volume: (cm) 2.818 Starting Pos Ending Positi Maximum Dista rea: -59.9% olume: -19.9% n: Small (1-33%) Yes ition (o'clock): 6 on (o'clock): 3 nce: (cm) 0.8 Wound Description Classification: Grade 2 Foul Odor After Wound Margin: Well defined, not attached Slough/Fibrino Exudate Amount: Medium Exudate Type: Serosanguineous Exudate Color: red, brown Wound Bed Granulation Amount: Large (67-100%) Granulation Quality: Pink Fascia Exposed: Necrotic Amount: Small (1-33%) Fat Layer (Subcu Necrotic Quality: Adherent Slough Tendon Exposed: Muscle Exposed: Joint Exposed: Bone Exposed: Treatment Notes Wound #1 (Left Metatarsal head fifth) 1. Cleanse With Wound Cleanser Soap and water 2. Periwound Care Skin Prep 3. Primary Dressing Applied Hydrofera Blue 4. Secondary Dressing Dry Gauze Roll Gauze 5. Secured With Medipore tape 7. Footwear/Offloading device applied Felt/Foam Wedge shoe Cleansing: No Yes Exposed  Structure No taneous Tissue) Exposed: Yes No No No No Electronic Signature(s) Signed: 04/16/2019 3:22:35 PM By: [name] EMT/HBOT Signed: 04/16/2019 6:18:44 PM By: [name] RN, BSN Entered By: [name] on 04/16/2019 14:28:07 -------------------------------------------------------------------------------- Vitals Details Patient Name: Date of Service: [name], Max 04/11/2019 4:00 PM Medical Record [id number] Patient Account Number: [id number] Date of Birth/Sex: Treating RN: 02-01-1986 (34 y.o. [name] Primary Care Charitie Hinote: [name] Other Clinician: Referring Mesiah Manzo: Treating Yeng Perz/Extender:Robson, [name], [name]: 12 Vital Signs Time Taken: 16:08 Temperature (F): 97.6 Height (in): 77 Pulse (bpm): 102 Weight (lbs): 260 Respiratory Rate (breaths/min): 18 Body Mass Index (BMI): 30.8 Blood Pressure (mmHg): 135/61 Capillary Blood Glucose (mg/dl): 239 Reference Range: 80 - 120 mg / dl Electronic Signature(s) Signed: 04/16/2019 6:18:44 PM By: [name] RN, BSN Entered By: [name] on 04/11/2019 16:10:15

## 2019-04-16 NOTE — Progress Notes (Signed)
[name], [name] ([id number]) Visit Report for 04/16/2019 Arrival Information Details Patient Name: Date of Service: [name], [name] 04/16/2019 10:00 AM Medical Record Number:[id number] Patient Account Number: [id number] Date of Birth/Sex: Treating RN: [birth year] (33 y.o. M) Primary Care Evany Schecter: Katy Apo Other Clinician: [name] Referring Aleatha Taite: Treating Teresia Myint/Extender:Robson, Almira Coaster, Chalmers Guest in Treatment: 13 Visit Information History Since Last Visit Added or deleted any medications: No Patient Arrived: Ambulatory Any new allergies or adverse reactions: No Arrival Time: 10:05 Had a fall or experienced change in No Accompanied By: self activities of daily living that may affect Transfer Assistance: None risk of falls: Patient Identification Verified: Yes Signs or symptoms of abuse/neglect since last No Secondary Verification Process Yes visito Completed: Hospitalized since last visit: No Patient Requires Transmission-Based No Implantable device outside of the clinic excluding No Precautions: cellular tissue based products placed in the center Patient Has Alerts: No since last visit: Pain Present Now: No Electronic Signature(s) Signed: 04/16/2019 3:22:35 PM By: [name] EMT/HBOT Entered By: [name] on 04/16/2019 10:47:23 -------------------------------------------------------------------------------- Encounter Discharge Information Details Patient Name: Date of Service: [name], [name] 04/16/2019 10:00 AM Medical Record Number:[id number] Patient Account Number: [id number] Date of Birth/Sex: Treating RN: May 01, 1985 (33 y.o. M) Primary Care Odarius Dines: Katy Apo Other Clinician: [name] Referring Thara Searing: Treating Ever Halberg/Extender:Robson, Almira Coaster, Chalmers Guest in Treatment: 13 Encounter Discharge Information Items Discharge Condition: Stable Ambulatory Status: Ambulatory Discharge Destination:  Home Transportation: Private Auto Accompanied By: self Schedule Follow-up Appointment: Yes Clinical Summary of Care: Patient Declined Electronic Signature(s) Signed: 04/16/2019 3:22:35 PM By: [name] EMT/HBOT Entered By: [name] on 04/16/2019 13:14:12 -------------------------------------------------------------------------------- Patient/Caregiver Education Details Patient Name: [name], [name] 1/12/2021andnbsp10:00 Date of Service: AM Medical Record [id number] Number: Patient Account Number: [id number] Treating RN: 01/16/1986 (33 y.o. Date of Birth/Gender: M) Other Clinician: [name] Primary Care Physician: Katy Apo Treating Baltazar Najjar Referring Physician: Physician/Extender: Renee Ramus in Treatment: 13 Education Assessment Education Provided To: Patient Education Topics Provided Hyperbaric Oxygenation: Methods: Explain/Verbal Responses: State content correctly Electronic Signature(s) Signed: 04/16/2019 3:22:35 PM By: [name] EMT/HBOT Entered By: [name] on 04/16/2019 13:14:00 -------------------------------------------------------------------------------- Vitals Details Patient Name: Date of Service: [name], [name] 04/16/2019 10:00 AM Medical Record Number:[id number] Patient Account Number: [id number] Date of Birth/Sex: Treating RN: July 27, 1985 (33 y.o. M) Primary Care Marquis Down: Katy Apo Other Clinician: [name] Referring Waniya Hoglund: Treating Dandra Velardi/Extender:Robson, Almira Coaster, Chalmers Guest in Treatment: 13 Vital Signs Time Taken: 10:10 Temperature (F): 98 Height (in): 77 Pulse (bpm): 104 Weight (lbs): 260 Respiratory Rate (breaths/min): 16 Body Mass Index (BMI): 30.8 Blood Pressure (mmHg): 128/99 Capillary Blood Glucose (mg/dl): 681 Reference Range: 80 - 120 mg / dl Electronic Signature(s) Signed: 04/16/2019 3:22:35 PM By: [name] EMT/HBOT Entered By: [name] on  04/16/2019 10:47:42

## 2019-04-16 NOTE — Progress Notes (Signed)
[name], [name] ([id number]) Visit Report for 04/16/2019 SuperBill Details Patient Name: Date of Service: [name], [name] 04/16/2019 Medical Record Number:[id number] Patient Account Number: [id number] Date of Birth/Sex: Treating RN: [birth year] (34 y.o. 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

## 2019-04-17 ENCOUNTER — Other Ambulatory Visit: Payer: Self-pay

## 2019-04-17 ENCOUNTER — Encounter (HOSPITAL_BASED_OUTPATIENT_CLINIC_OR_DEPARTMENT_OTHER): Payer: BC Managed Care – PPO | Attending: Physician Assistant | Admitting: Physician Assistant

## 2019-04-17 DIAGNOSIS — E11621 Type 2 diabetes mellitus with foot ulcer: Secondary | ICD-10-CM | POA: Diagnosis not present

## 2019-04-17 LAB — GLUCOSE, CAPILLARY
Glucose-Capillary: 283 mg/dL — ABNORMAL HIGH (ref 70–99)
Glucose-Capillary: 247 mg/dL — ABNORMAL HIGH (ref 70–99)

## 2019-04-17 NOTE — Progress Notes (Signed)
[place], [name] ([id number]) Visit Report for 04/17/2019 Problem List Details Patient Name: Date of Service: [place], [name] 04/17/2019 10:00 AM Medical Record Number:[id number] Patient Account Number: [id number] Date of Birth/Sex: Treating RN: August 02, 1985 (33 y.o. 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

## 2019-04-17 NOTE — Progress Notes (Addendum)
[name], [name] ([id number]) Visit Report for 04/17/2019 HBO Details Patient Name: Date of Service: [name], [name] 04/17/2019 10:00 AM Medical Record Number:[id number] Patient Account Number: [id number] Date of Birth/Sex: Treating RN: 03/13/1986 (33 y.o. M) Primary Care [name]: [name] Other Clinician: Benjaman [name] Referring [name]: Treating [name]/Extender:[name], [name], [name] in Treatment: 13 HBO Treatment Course Details Treatment Course Number: 1 Ordering [name]: [name] Total Treatments Ordered: 40 HBO Treatment Start Date: 03/12/2019 HBO Indication: Diabetic Ulcer(s) of the Lower Extremity HBO Treatment Details Treatment Number: 21 Patient Type: Outpatient Chamber Type: Monoplace Chamber Serial #: [id number] Treatment Protocol: 2.0 ATA with 90 minutes oxygen, and no air breaks Treatment Details Compression Rate Down: 2.0 psi / minute De-Compression Rate Up: 2.0 psi / minute Air breaks and CompressTx Pressure breathing DecompressDecompress Begins Reached periods Begins Ends (leave unused spaces blank) Chamber Pressure (ATA)1 2 ------2 1 Clock Time (24 hr) 10:24 10:32 - - - - - - 12:02 12:10 Treatment Length: 106 (minutes) Treatment Segments: 4 Vital Signs Capillary Blood Glucose Reference Range: 80 - 120 mg / dl HBO Diabetic Blood Glucose Intervention Range: <131 mg/dl or >998 mg/dl Time Vitals Blood Respiratory Capillary Blood Glucose Pulse Action Type: Pulse: Temperature: Taken: Pressure: Rate: Glucose (mg/dl): Meter #: Oximetry (%) Taken: Pre 10:15 126/71 100 17 98.3 283 Post 10:13 164/98 86 15 98.1 247 Treatment Response Treatment Toleration: Well Treatment Completion Treatment Completed without Adverse Event Status: Additional Procedure Documentation Tissue Sevierity: Necrosis of bone Physician HBO Attestation: I certify that I supervised this HBO treatment in accordance with Medicare guidelines. A trained  Yes emergency response team is readily available per hospital policies and procedures. Continue HBOT as ordered. Yes Electronic Signature(s) Signed: 04/17/2019 5:46:19 PM By: Max Kelp PA-C Previous Signature: 04/17/2019 3:30:11 PM Version By: [name] EMT/HBOT Entered By: [name] on 04/17/2019 17:46:19 -------------------------------------------------------------------------------- HBO Safety Checklist Details Patient Name: Date of Service: [name], [name] 04/17/2019 10:00 AM Medical Record Number:[id number] Patient Account Number: [id number] Date of Birth/Sex: Treating RN: 01/01/1986 (33 y.o. M) Primary Care [name]: [name] Other Clinician: Benjaman [name] Referring [name]: Treating [name]/Extender:[name], [name], [name] in Treatment: 13 HBO Safety Checklist Items Safety Checklist Consent Form Signed Patient voided / foley secured and emptied When did you last eato 0900 - yogurt Last dose of injectable or oral agent insulin NA Ostomy pouch emptied and vented if applicable All implantable devices assessed, documented and approved NA Intravenous access site secured and place Valuables secured Linens and cotton and cotton/polyester blend (less than 51% polyester) Personal oil-based products / skin lotions / body lotions removed NA Wigs or hairpieces removed NA Smoking or tobacco materials removed Books / newspapers / magazines / loose paper removed Cologne, aftershave, perfume and deodorant removed Jewelry removed (may wrap wedding band) NA Make-up removed Hair care products removed NA Battery operated devices (external) removed NA Heating patches and chemical warmers removed NA Titanium eyewear removed NA Nail polish cured greater than 10 hours NA Casting material cured greater than 10 hours NA Hearing aids removed NA Loose dentures or partials removed NA Prosthetics have been removed Patient demonstrates correct use of air  break device (if applicable) Patient concerns have been addressed Patient grounding bracelet on and cord attached to chamber Specifics for Inpatients (complete in addition to above) Medication sheet sent with patient Intravenous medications needed or due during therapy sent with patient Drainage tubes (e.g. nasogastric tube or chest tube secured and vented) Endotracheal or Tracheotomy tube  secured Cuff deflated of air and inflated with saline Airway suctioned Electronic Signature(s) Signed: 04/17/2019 11:16:44 AM By: [name] EMT/HBOT Entered By: [name] on 04/17/2019 11:16:44

## 2019-04-17 NOTE — Progress Notes (Signed)
Beldin, [name] ([id number]) Visit Report for 04/17/2019 Arrival Information Details Patient Name: Date of Service: [name], [name] 04/17/2019 10:00 AM Medical Record Number:[id number] Patient Account Number: [id number] Date of Birth/Sex: Treating RN: 1985-08-04 (34 y.o. M) Primary Care Tameya Kuznia: Katy Apo Other Clinician: [name] Referring Chalmer Zheng: Treating Rochanda Harpham/Extender:Stone III, Aleda Grana, Chalmers Guest in Treatment: 13 Visit Information History Since Last Visit Added or deleted any medications: No Patient Arrived: Ambulatory Any new allergies or adverse reactions: No Arrival Time: 10:10 Had a fall or experienced change in No Accompanied By: self activities of daily living that may affect Transfer Assistance: None risk of falls: Patient Identification Verified: Yes Signs or symptoms of abuse/neglect since last No Secondary Verification Process Yes visito Completed: Hospitalized since last visit: No Patient Requires Transmission-Based No Implantable device outside of the clinic excluding No Precautions: cellular tissue based products placed in the center Patient Has Alerts: No since last visit: Pain Present Now: No Electronic Signature(s) Signed: 04/17/2019 3:30:11 PM By: [name] EMT/HBOT Entered By: [name] on 04/17/2019 11:15:14 -------------------------------------------------------------------------------- Encounter Discharge Information Details Patient Name: Date of Service: [name], [name] 04/17/2019 10:00 AM Medical Record Number:[id number] Patient Account Number: [id number] Date of Birth/Sex: Treating RN: Dec 23, 1985 (34 y.o. M) Primary Care Journei Thomassen: Katy Apo Other Clinician: [name] Referring Carole Deere: Treating Quintel Mccalla/Extender:Stone III, Aleda Grana, Chalmers Guest in Treatment: 13 Encounter Discharge Information Items Discharge Condition: Stable Ambulatory Status: Ambulatory Discharge Destination:  Home Transportation: Private Auto Accompanied By: self Schedule Follow-up Appointment: Yes Clinical Summary of Care: Patient Declined Electronic Signature(s) Signed: 04/17/2019 3:30:11 PM By: [name] EMT/HBOT Entered By: [name] on 04/17/2019 13:12:07 -------------------------------------------------------------------------------- Patient/Caregiver Education Details Patient Name: [name], [name] 1/13/2021andnbsp10:00 Date of Service: AM Medical Record [id number] Number: Patient Account Number: [id number] Treating RN: 08-28-1985 (33 y.o. Date of Birth/Gender: M) Other Clinician: [name] Primary Care Physician: Katy Apo Treating Lenda Kelp Referring Physician: Physician/Extender: Renee Ramus in Treatment: 13 Education Assessment Education Provided To: Patient Education Topics Provided Hyperbaric Oxygenation: Methods: Explain/Verbal Responses: State content correctly Electronic Signature(s) Signed: 04/17/2019 3:30:11 PM By: [name] EMT/HBOT Entered By: [name] on 04/17/2019 13:11:52 -------------------------------------------------------------------------------- Vitals Details Patient Name: Date of Service: [name], [name] 04/17/2019 10:00 AM Medical Record Number:[id number] Patient Account Number: [id number] Date of Birth/Sex: Treating RN: Aug 24, 1985 (34 y.o. M) Primary Care Sonjia Wilcoxson: Katy Apo Other Clinician: [name] Referring Zack Crager: Treating Amayrany Cafaro/Extender:Stone III, Aleda Grana, Deirdre Peer Weeks in Treatment: 13 Vital Signs Time Taken: 10:15 Temperature (F): 98.3 Height (in): 77 Pulse (bpm): 100 Weight (lbs): 260 Respiratory Rate (breaths/min): 17 Body Mass Index (BMI): 30.8 Blood Pressure (mmHg): 126/71 Capillary Blood Glucose (mg/dl): 195 Reference Range: 80 - 120 mg / dl Electronic Signature(s) Signed: 04/17/2019 3:30:11 PM By: [name] EMT/HBOT Entered By: [name] on  04/17/2019 11:15:30

## 2019-04-18 ENCOUNTER — Encounter (HOSPITAL_BASED_OUTPATIENT_CLINIC_OR_DEPARTMENT_OTHER): Payer: BC Managed Care – PPO | Admitting: Internal Medicine

## 2019-04-18 DIAGNOSIS — E11621 Type 2 diabetes mellitus with foot ulcer: Secondary | ICD-10-CM | POA: Diagnosis not present

## 2019-04-18 LAB — GLUCOSE, CAPILLARY
Glucose-Capillary: 246 mg/dL — ABNORMAL HIGH (ref 70–99)
Glucose-Capillary: 214 mg/dL — ABNORMAL HIGH (ref 70–99)

## 2019-04-18 NOTE — Progress Notes (Addendum)
[name] [name] ([id number]) Visit Report for 04/18/2019 Arrival Information Details Patient Name: Date of Service: [name] [name] 04/18/2019 4:00 PM Medical Record [id number] Patient Account Number: [id number] Date of Birth/Sex: Treating RN: 06-Jan-1986 (34 y.o. [name] [name]) Carlene Coria Primary Care Florita Nitsch: [name] Other Clinician: Referring Joniqua Sidle: Treating Ginnie Marich/Extender:Robson, [name], [name]: 13 Visit Information History Since Last Visit All ordered tests and consults were completed: No Patient Arrived: Ambulatory Added or deleted any medications: No Arrival Time: 16:29 Any new allergies or adverse reactions: No Accompanied By: self Had a fall or experienced change in No Transfer Assistance: None activities of daily living that may affect Patient Identification Verified: Yes risk of falls: Secondary Verification Process Completed: Yes Signs or symptoms of abuse/neglect since last No Patient Requires Transmission-Based No visito Precautions: Hospitalized since last visit: No Patient Has Alerts: No Implantable device outside of the clinic excluding No cellular tissue based products placed in the center since last visit: Has Dressing in Place as Prescribed: Yes Pain Present Now: No Electronic Signature(s) Signed: 04/18/2019 6:24:40 PM By: Carlene Coria RN Entered By: Carlene Coria on 04/18/2019 16:29:52 -------------------------------------------------------------------------------- Complex / Palliative Patient Assessment Details Patient Name: Date of Service: [name] [name] 04/18/2019 4:00 PM Medical Record [id number] Patient Account Number: [id number] Date of Birth/Sex: Treating RN: January 25, 1986 (34 y.o. [name] [name] Primary Care Abriana Saltos: [name] Other Clinician: Referring Thelma Lorenzetti: Treating Jaycie Kregel/Extender:Robson, [name], [name]: 13 Palliative Management Criteria Complex  Wound Management Criteria Patient has remarkable or complex co-morbidities requiring medications or treatments that extend wound healing times. Examples: Diabetes mellitus with chronic renal failure or end stage renal disease requiring dialysis Advanced or poorly controlled rheumatoid arthritis Diabetes mellitus and end stage chronic obstructive pulmonary disease Active cancer with current chemo- or radiation therapy Type 2 Diabetes, Osteomyelitis Care Approach Wound Care Plan: Complex Wound Management Electronic Signature(s) Signed: 04/22/2019 6:02:32 PM By: [name] RN, BSN Signed: 04/23/2019 7:41:55 AM By: [name] MD Entered By: [name] on 04/22/2019 08:24:04 -------------------------------------------------------------------------------- Encounter Discharge Information Details Patient Name: Date of Service: [name] [name] 04/18/2019 4:00 PM Medical Record [id number] Patient Account Number: [id number] Date of Birth/Sex: Treating RN: [birth year] (34 y.o. [name] [name] [name] [name] Primary Care Nixxon Faria: [name] Other Clinician: Referring Tessah Patchen: Treating Shan Valdes/Extender:Robson, [name], [name]: 13 Encounter Discharge Information Items Post Procedure Vitals Discharge Condition: Stable Temperature (F): 98.5 Ambulatory Status: Ambulatory Pulse (bpm): 82 Discharge Destination: Home Respiratory Rate (breaths/min): 18 Transportation: Private Auto Blood Pressure (mmHg): 144/95 Accompanied By: self Schedule Follow-up Appointment: Yes Clinical Summary of Care: Patient Declined Electronic Signature(s) Signed: 04/18/2019 6:24:40 PM By: Carlene Coria RN Entered By: Carlene Coria on 04/18/2019 17:32:24 -------------------------------------------------------------------------------- Lower Extremity Assessment Details Patient Name: Date of Service: [name] [name] 04/18/2019 4:00 PM Medical Record [id number] Patient Account  Number: [id number] Date of Birth/Sex: Treating RN: 1985-06-16 (34 y.o. [name] [name] Primary Care Devina Bezold: [name] Other Clinician: Referring Maximiliano Cromartie: Treating Kareemah Grounds/Extender:Robson, [name], Ashby Dawes Weeks in Treatment: 13 Edema Assessment Assessed: [Left: No] [Right: No] Edema: [Left: Ye] [Right: s] Calf Left: Right: Point of Measurement: 48 cm From Medial Instep 44 cm cm Ankle Left: Right: Point of Measurement: 12 cm From Medial Instep 28 cm cm Electronic Signature(s) Signed: 04/18/2019 6:24:40 PM By: Carlene Coria RN Entered By: Carlene Coria on 04/18/2019 16:31:01 -------------------------------------------------------------------------------- Multi Wound Chart Details Patient Name: Date of Service: [name] [name] 04/18/2019 4:00 PM Medical Record [id number] Patient Account Number: [id number] Date of Birth/Sex: Treating  RN: 1985-12-24 (34 y.o. M) Primary Care Robbin Escher: Katy Apo Other Clinician: Referring Larae Caison: Treating Mikele Sifuentes/Extender:Robson, Almira Coaster, Chalmers Guest in Treatment: 13 Vital Signs Height(in): 77 Pulse(bpm): 82 Weight(lbs): 260 Blood Pressure(mmHg): 144/95 Body Mass Index(BMI): 31 Temperature(F): 98.5 Respiratory 18 Rate(breaths/min): Photos: [1:No Photos] [N/A:N/A] Wound Location: [1:Left Metatarsal head fifth N/A] Wounding Event: [1:Trauma] [N/A:N/A] Primary Etiology: [1:Diabetic Wound/Ulcer of the N/A Lower Extremity] Comorbid History: [1:Type II Diabetes] [N/A:N/A] Date Acquired: [1:08/03/2018] [N/A:N/A] Weeks of Treatment: [1:13] [N/A:N/A] Wound Status: [1:Open] [N/A:N/A] Measurements L x W x D 2.5x2.5x0.3 [N/A:N/A] (cm) Area (cm) : [1:4.909] [N/A:N/A] Volume (cm) : [1:1.473] [N/A:N/A] % Reduction in Area: [1:-67.10%] [N/A:N/A] % Reduction in Volume: 37.30% [N/A:N/A] Classification: [1:Grade 2] [N/A:N/A] Exudate Amount: [1:Medium] [N/A:N/A] Exudate Type: [1:Serosanguineous]  [N/A:N/A] Exudate Color: [1:red, brown] [N/A:N/A] Wound Margin: [1:Well defined, not attached] [N/A:N/A] Granulation Amount: [1:Large (67-100%)] [N/A:N/A] Granulation Quality: [1:Pink] [N/A:N/A] Necrotic Amount: [1:Small (1-33%)] [N/A:N/A] Exposed Structures: [1:Fat Layer (Subcutaneous Tissue) Exposed: Yes Fascia: No Tendon: No Muscle: No Joint: No Bone: No] [N/A:N/A] Epithelialization: [1:Small (1-33%)] [N/A:N/A] Debridement: [1:Debridement - Selective/Open Wound] [N/A:N/A] Pre-procedure [1:17:05] [N/A:N/A] Verification/Time Out Taken: Tissue Debrided: [1:Callus] [N/A:N/A] Level: [1:Non-Viable Tissue] [N/A:N/A] Debridement Area (sq cm):6.25 [N/A:N/A] Instrument: [1:Curette] [N/A:N/A] Bleeding: [1:Minimum] [N/A:N/A] Hemostasis Achieved: [1:Pressure] [N/A:N/A] Procedural Pain: [1:0] [N/A:N/A] Post Procedural Pain: [1:0] [N/A:N/A] Debridement Treatment Procedure was tolerated [N/A:N/A] Response: [1:well] Post Debridement [1:2.5x2.5x0.3] [N/A:N/A] Measurements L x W x D (cm) Post Debridement [1:1.473] [N/A:N/A] Volume: (cm) Procedures Performed: Debridement [N/A:N/A] Treatment Notes Electronic Signature(s) Signed: 04/18/2019 5:49:05 PM By: Baltazar Najjar MD Entered By: Baltazar Najjar on 04/18/2019 17:36:54 -------------------------------------------------------------------------------- Multi-Disciplinary Care Plan Details Patient Name: Date of Service: [place], [name] 04/18/2019 4:00 PM Medical Record Number:[id number] Patient Account Number: [id number] Date of Birth/Sex: Treating RN: [birth year] (33 y.o. [name] Primary Care Juanangel Soderholm: Katy Apo Other Clinician: Referring Latise Dilley: Treating Rosealyn Little/Extender:Robson, Almira Coaster, Chalmers Guest in Treatment: 13 Active Inactive Wound/Skin Impairment Nursing Diagnoses: Impaired tissue integrity Knowledge deficit related to ulceration/compromised skin integrity Goals: Patient/caregiver will verbalize  understanding of skin care regimen Date Initiated: 01/11/2019 Target Resolution Date: 05/03/2019 Goal Status: Active Ulcer/skin breakdown will have a volume reduction of 30% by week 4 Date Initiated: 01/11/2019 Date Inactivated: 02/26/2019 Target Resolution Date: 02/08/2019 Goal Status: Unmet Unmet Reason: comorbities Ulcer/skin breakdown will have a volume reduction of 50% by week 8 Date Initiated: 02/26/2019 Date Inactivated: 03/28/2019 Target Resolution Date: 04/12/2019 Unmet Reason: no change Goal Status: Unmet in wound measurement. see wound measurment. Interventions: Assess patient/caregiver ability to obtain necessary supplies Assess patient/caregiver ability to perform ulcer/skin care regimen upon admission and as needed Assess ulceration(s) every visit Provide education on ulcer and skin care Notes: Electronic Signature(s) Signed: 04/18/2019 6:31:26 PM By: Zandra Abts RN, BSN Entered By: Zandra Abts on 04/18/2019 18:03:07 -------------------------------------------------------------------------------- Pain Assessment Details Patient Name: Date of Service: [place], [name] 04/18/2019 4:00 PM Medical Record Number:[id number] Patient Account Number: [id number] Date of Birth/Sex: Treating RN: [birth year] (33 y.o. [name] Primary Care Lise Pincus: Katy Apo Other Clinician: Referring Kitti Mcclish: Treating Siah Kannan/Extender:Robson, Almira Coaster, Deirdre Peer Weeks in Treatment: 13 Active Problems Location of Pain Severity and Description of Pain Patient Has Paino No Site Locations Pain Management and Medication Current Pain Management: Electronic Signature(s) Signed: 04/18/2019 6:24:40 PM By: Yevonne Pax RN Entered By: Yevonne Pax on 04/18/2019 16:30:37 -------------------------------------------------------------------------------- Patient/Caregiver Education Details Patient Name: Date of Service: [place], [name] 1/14/2021andnbsp4:00 PM Medical Record  Number:[id number] Patient Account Number: [id number] Date of Birth/Gender: Treating RN: [birth year] (33 y.o. [name]  Primary Care Physician: Katy Apo Other Clinician: Referring Physician: Treating Physician/Extender:Robson, Almira Coaster, Chalmers Guest in Treatment: 13 Education Assessment Education Provided To: Patient Education Topics Provided Wound/Skin Impairment: Methods: Explain/Verbal Responses: State content correctly [employer]) Signed: 04/18/2019 6:31:26 PM By: Zandra Abts RN, BSN Entered By: Zandra Abts on 04/18/2019 18:03:18 -------------------------------------------------------------------------------- Wound Assessment Details Patient Name: Date of Service: [place], [name] 04/18/2019 4:00 PM Medical Record Number:[id number] Patient Account Number: [id number] Date of Birth/Sex: Treating RN: 23-Sep-1985 (33 y.o. [name]) Yevonne Pax Primary Care Genesi Stefanko: Katy Apo Other Clinician: Referring Ramonica Grigg: Treating Krist Rosenboom/Extender:Robson, Almira Coaster, Deirdre Peer Weeks in Treatment: 13 Wound Status Wound Number: 1 Primary Diabetic Wound/Ulcer of the Lower Etiology: Extremity Wound Location: Left Metatarsal head fifth Wound Status: Open Wounding Event: Trauma Comorbid Type II Diabetes Date Acquired: 08/03/2018 History: Weeks Of Treatment: 13 Clustered Wound: No Wound Measurements Length: (cm) 2.5 Width: (cm) 2.5 Depth: (cm) 0.3 Area: (cm) 4.909 Volume: (cm) 1.473 Wound Description Classification: Grade 2 Wound Margin: Well defined, not attached Exudate Amount: Medium Exudate Type: Serosanguineous Exudate Color: red, brown Wound Bed Granulation Amount: Large (67-100%) Granulation Quality: Pink Necrotic Amount: Small (1-33%) Necrotic Quality: Adherent Slough r After Cleansing: No ibrino Yes Exposed Structure posed: No (Subcutaneous Tissue) Exposed: Yes posed: No posed: No osed: No sed: No % Reduction in Area:  -67.1% % Reduction in Volume: 37.3% Epithelialization: Small (1-33%) Tunneling: No Undermining: No Foul Odo Slough/F Fascia Ex Fat Layer Tendon Ex Muscle Ex Joint Exp Bone Expo Electronic Signature(s) Signed: 04/18/2019 6:24:40 PM By: Yevonne Pax RN Entered By: Yevonne Pax on 04/18/2019 16:31:25 -------------------------------------------------------------------------------- Vitals Details Patient Name: Date of Service: [place], [name] 04/18/2019 4:00 PM Medical Record Number:[id number] Patient Account Number: [id number] Date of Birth/Sex: Treating RN: 14-Oct-1985 (33 y.o. [name] Primary Care Yadriel Kerrigan: Katy Apo Other Clinician: Referring Sophiya Morello: Treating Christie Copley/Extender:Robson, Almira Coaster, Deirdre Peer Weeks in Treatment: 13 Vital Signs Time Taken: 04:29 Temperature (F): 98.5 Height (in): 77 Pulse (bpm): 82 Weight (lbs): 260 Respiratory Rate (breaths/min): 18 Body Mass Index (BMI): 30.8 Blood Pressure (mmHg): 144/95 Reference Range: 80 - 120 mg / dl Electronic Signature(s) Signed: 04/18/2019 6:24:40 PM By: Yevonne Pax RN Entered By: Yevonne Pax on 04/18/2019 16:30:30

## 2019-04-18 NOTE — Progress Notes (Signed)
[place], [name] ([id number]) Visit Report for 04/18/2019 Arrival Information Details Patient Name: Date of Service: [place], [name] 04/18/2019 10:00 AM Medical Record Number:[id number] Patient Account Number: [id number] Date of Birth/Sex: Treating RN: 01-05-1986 (33 y.o. M) Primary Care Jaanvi Fizer: Katy Apo Other Clinician: [name] Referring Leodan Bolyard: Treating Casmir Auguste/Extender:Robson, Almira Coaster, Chalmers Guest in Treatment: 13 Visit Information History Since Last Visit Added or deleted any medications: No Patient Arrived: Ambulatory Any new allergies or adverse reactions: No Arrival Time: 10:10 Had a fall or experienced change in No Accompanied By: self activities of daily living that may affect Transfer Assistance: None risk of falls: Patient Identification Verified: Yes Signs or symptoms of abuse/neglect since last No Secondary Verification Process Yes visito Completed: Hospitalized since last visit: No Patient Requires Transmission-Based No Implantable device outside of the clinic excluding No Precautions: cellular tissue based products placed in the center Patient Has Alerts: No since last visit: Pain Present Now: No Electronic Signature(s) Signed: 04/18/2019 3:33:56 PM By: [name] EMT/HBOT Entered By: [name] on 04/18/2019 11:08:44 -------------------------------------------------------------------------------- Encounter Discharge Information Details Patient Name: Date of Service: [place], [name] 04/18/2019 10:00 AM Medical Record Number:[id number] Patient Account Number: [id number] Date of Birth/Sex: Treating RN: [birth year] (33 y.o. M) Primary Care Shauntee Karp: Katy Apo Other Clinician: [name] Referring Jadalyn Oliveri: Treating Jailen Lung/Extender:Robson, Almira Coaster, Chalmers Guest in Treatment: 13 Encounter Discharge Information Items Discharge Condition: Stable Ambulatory Status: Ambulatory Discharge Destination:  Home Transportation: Private Auto Accompanied By: self Schedule Follow-up Appointment: Yes Clinical Summary of Care: Patient Declined Electronic Signature(s) Signed: 04/18/2019 3:33:56 PM By: [name] EMT/HBOT Entered By: [name] on 04/18/2019 13:23:02 -------------------------------------------------------------------------------- Patient/Caregiver Education Details Patient Name: [place], [name] 1/14/2021andnbsp10:00 Date of Service: AM Medical Record [id number] Number: Patient Account Number: [id number] Treating RN: 1986/01/20 (33 y.o. Date of Birth/Gender: M) Other Clinician: [name] Primary Care Physician: Katy Apo Treating Baltazar Najjar Referring Physician: Physician/Extender: Renee Ramus in Treatment: 13 Education Assessment Education Provided To: Patient Education Topics Provided Hyperbaric Oxygenation: Methods: Explain/Verbal Responses: State content correctly Electronic Signature(s) Signed: 04/18/2019 3:33:56 PM By: [name] EMT/HBOT Entered By: [name] on 04/18/2019 13:22:48 -------------------------------------------------------------------------------- Vitals Details Patient Name: Date of Service: [place], [name] 04/18/2019 10:00 AM Medical Record Number:[id number] Patient Account Number: [id number] Date of Birth/Sex: Treating RN: Dec 10, 1985 (33 y.o. M) Primary Care Avianah Pellman: Katy Apo Other Clinician: [name] Referring Noraa Pickeral: Treating Shakema Surita/Extender:Robson, Almira Coaster, Chalmers Guest in Treatment: 13 Vital Signs Time Taken: 10:15 Temperature (F): 98 Height (in): 77 Pulse (bpm): 103 Weight (lbs): 260 Respiratory Rate (breaths/min): 18 Body Mass Index (BMI): 30.8 Blood Pressure (mmHg): 154/87 Capillary Blood Glucose (mg/dl): 332 Reference Range: 80 - 120 mg / dl Electronic Signature(s) Signed: 04/18/2019 3:33:56 PM By: [name] EMT/HBOT Entered By: [name] on  04/18/2019 11:09:01

## 2019-04-18 NOTE — Progress Notes (Addendum)
[name], Max ([id number]) Visit Report for 04/18/2019 HBO Details Patient Name: Date of Service: [name], [place] 04/18/2019 10:00 AM Medical Record [id number] Patient Account Number: [id number] Date of Birth/Sex: Treating RN: 1985-04-21 (34 y.o. M) Primary Care Jailan Trimm: [name] Other Clinician: [name] Referring Wendi Lastra: Treating Erminia Mcnew/Extender:Robson, [name], [name]: 13 HBO Treatment Course Details Treatment Course Number: 1 Ordering Niva Murren: [name] Total Treatments Ordered: 40 HBO Treatment Start Date: 03/12/2019 HBO Indication: Diabetic Ulcer(s) of the Lower Extremity HBO Treatment Details Treatment Number: 22 Patient Type: Outpatient Chamber Type: Monoplace Chamber Serial #: [id number] Treatment Protocol: 2.0 ATA with 90 minutes oxygen, and no air breaks Treatment Details Compression Rate Down: 2.0 psi / minute De-Compression Rate Up: 2.0 psi / minute Air breaks and CompressTx Pressure breathing DecompressDecompress Begins Reached periods Begins Ends (leave unused spaces blank) Chamber Pressure (ATA)1 2 ------2 1 Clock Time (24 hr) 10:24 10:32 - - - - - - 12:02 12:10 Treatment Length: 106 (minutes) Treatment Segments: 4 Vital Signs Capillary Blood Glucose Reference Range: 80 - 120 mg / dl HBO Diabetic Blood Glucose Intervention Range: <131 mg/dl or >249 mg/dl Time Vitals Blood Respiratory Capillary Blood Glucose Pulse Action Type: Pulse: Temperature: Taken: Pressure: Rate: Glucose (mg/dl): Meter #: Oximetry (%) Taken: Pre 10:15 154/87 103 18 98 246 Post 12:12 145/99 91 17 98.2 214 Treatment Response Treatment Toleration: Well Treatment Completion Treatment Completed without Adverse Event Status: Additional Procedure Documentation Tissue Sevierity: Necrosis of bone Shakemia [place] Notes No concerns with treatment given. Patient was also seen for wound care evaluation Physician HBO Attestation: I  certify that I supervised this HBO treatment in accordance with Medicare guidelines. A trained Yes emergency response team is readily available per hospital policies and procedures. Continue HBOT as ordered. Yes Electronic Signature(s) Signed: 04/18/2019 5:49:05 PM By: [name] MD Previous Signature: 04/18/2019 3:33:56 PM Version By: [name] EMT/HBOT Entered By: [name] on 04/18/2019 17:45:16 -------------------------------------------------------------------------------- HBO Safety Checklist Details Patient Name: Date of Service: [name], Max 04/18/2019 10:00 AM Medical Record [id number] Patient Account Number: [id number] Date of Birth/Sex: Treating RN: 1985/05/20 (34 y.o. M) Primary Care Tania Perrott: [name] Other Clinician: [name] Referring Shacara Cozine: Treating Tarina Volk/Extender:Robson, [name], [name]: 13 HBO Safety Checklist Items Safety Checklist Consent Form Signed Patient voided / foley secured and emptied When did you last eato 0900 - yogurt Last dose of injectable or oral agent insulin NA Ostomy pouch emptied and vented if applicable All implantable devices assessed, documented and approved NA Intravenous access site secured and place Valuables secured Linens and cotton and cotton/polyester blend (less than 51% polyester) Personal oil-based products / skin lotions / body lotions removed NA Wigs or hairpieces removed NA Smoking or tobacco materials removed Books / newspapers / magazines / loose paper removed Cologne, aftershave, perfume and deodorant removed Jewelry removed (may wrap wedding band) NA Make-up removed Hair care products removed NA Battery operated devices (external) removed NA Heating patches and chemical warmers removed NA Titanium eyewear removed NA Nail polish cured greater than 10 hours NA Casting material cured greater than 10 hours NA Hearing aids removed NA Loose dentures  or partials removed NA Prosthetics have been removed Patient demonstrates correct use of air break device (if applicable) Patient concerns have been addressed Patient grounding bracelet on and cord attached to chamber Specifics for Inpatients (complete in addition to above) Medication sheet sent with patient Intravenous medications needed or due during therapy sent with patient Drainage tubes (e.g. nasogastric  tube or chest tube secured and vented) Endotracheal or Tracheotomy tube secured Cuff deflated of air and inflated with saline Airway suctioned Electronic Signature(s) Signed: 04/18/2019 11:09:46 AM By: [name] EMT/HBOT Entered By: [name] on 04/18/2019 11:09:46

## 2019-04-18 NOTE — Progress Notes (Signed)
[name], [name] ([id number]) Visit Report for 04/18/2019 Debridement Details Patient Name: Date of Service: [name], [name] 04/18/2019 4:00 PM Medical Record [id number] Patient Account Number: [id number] Date of Birth/Sex: Treating RN: 15-May-1985 (34 y.o. [name] Primary Care Provider: Kandice [name] Other Clinician: Referring Provider: Treating Provider/Extender:[name], [name], [name] in Treatment: 13 Debridement Performed for Wound #1 Left Metatarsal head fifth Assessment: Performed By: Physician [name]., MD Debridement Type: Debridement Severity of Tissue Pre Fat layer exposed Debridement: Level of Consciousness (Pre- Awake and Alert procedure): Pre-procedure Verification/Time Out Taken: Yes - 17:05 Start Time: 17:05 Total Area Debrided (L x W): 2.5 (cm) x 2.5 (cm) = 6.25 (cm) Tissue and other material Non-Viable, Callus debrided: Level: Non-Viable Tissue Debridement Description: Selective/Open Wound Instrument: Curette Bleeding: Minimum Hemostasis Achieved: Pressure End Time: 17:07 Procedural Pain: 0 Post Procedural Pain: 0 Response to Treatment: Procedure was tolerated well Level of Consciousness Awake and Alert (Post-procedure): Post Debridement Measurements of Total Wound Length: (cm) 2.5 Width: (cm) 2.5 Depth: (cm) 0.3 Volume: (cm) 1.473 Character of Wound/Ulcer Post Improved Debridement: Severity of Tissue Post Debridement: Fat layer exposed Post Procedure Diagnosis Same as Pre-procedure Electronic Signature(s) Signed: 04/18/2019 5:49:05 PM By: [name] MD Signed: 04/18/2019 6:31:26 PM By: Max Hurst RN, BSN Entered By: [name] on 04/18/2019 17:06:59 -------------------------------------------------------------------------------- HPI Details Patient Name: Date of Service: [name], [name] 04/18/2019 4:00 PM Medical Record [id number] Patient Account Number: [id number] Date of  Birth/Sex: Treating RN: Mar 16, 1986 (34 y.o. 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 He comes in for review of a wound over the plantar fifth metatarsal head extending into the lateral part of the foot. He was followed for this previously by his podiatrist Dr. Cornelius [name]. As the patient tells his story he went to see podiatry first for a swelling he developed on the lateral part of his fifth metatarsal head in May. He states this was "open" by podiatry and the area closed. He was followed up in June and it was again opened callus removed and it closed promptly. There were plans being made for surgery on the fifth metatarsal head in June however his blood sugar was apparently too high for anesthesia. Apparently the area was debrided and opened again in June and it is never closed since. Looking over the records from podiatry I am really not able to follow this. It was clear when he was first seen it was before 5/14 at that point he already had a wound. By 5/17 the ulcer was resolved. I do not see anything about a procedure. On 5/28 noted to have pre-ulcerative moderate keratosis. X-ray noted 1/5 contracted toe and tailor's bunion and metatarsal deformity. On a visit date on 09/28/2018 the dorsal part of the left foot it healed and resolved. There was concern about swelling in his lower extremity he was sent to the ER.. As far as I can tell he was seen in the ER on 7/12 with an ulcer on his left foot. A DVT rule out of the left leg was negative. I do not think I have complete records from podiatry but I am not able to verify the procedures this patient states he had. He states after the last procedure the wound has never closed  although I am not able to  follow this in the records I have from podiatry. He has not had a recent x-ray The patient has been using Neosporin on the wound. He is wearing a Darco shoe. He is still very active up on his foot working and exercising. Past medical history; type 2 diabetes ketosis-prone, leg swelling with a negative DVT study in July. Non-smoker ABI in our clinic was 0.85 on the left 10/16; substantial wound on the plantar left fifth met head extending laterally almost to the dorsal fifth MTP. We have been using silver alginate we gave him a Darco forefoot off loader. An x-ray did not show evidence of osteomyelitis did note soft tissue emphysema which I think was due to gas tracking through an open wound. There is no doubt in my mind he requires an MRI 10/23; MRI not booked until 3 November at the earliest this is largely due to his glucose sensor in the right arm. We have been using silver alginate. There has been an improvement 10/29; I am still not exactly sure when his MRI is booked for. He says it is the third but it is the 10th in epic. This definitely needs to be done. He is running a low-grade fever today but no other symptoms. No real improvement in the 1 02/26/2019 patient presents today for a follow-up visit here in our clinic he is last been seen in the clinic on October 29. Subsequently we were working on getting MRI to evaluate and see what exactly was going on and where we would need to go from the standpoint of whether or not he had osteomyelitis and again what treatments were going be required. Subsequently the patient ended up being admitted to the hospital on 02/07/2019 and was discharged on 02/14/2019. This is a somewhat interesting admission with a discharge diagnosis of pneumonia due to COVID-19 although he was positive for COVID-19 when tested at the urgent care but negative x2 when he was actually in the hospital. With that being said he did have acute  respiratory failure with hypoxia and it was noted he also have a left foot ulceration with osteomyelitis. With that being said he did require oxygen for his pneumonia and I level 4 L. He was placed on antivirals and steroids for the COVID-19. He was also transferred to the [facility] at one point. Nonetheless he did subsequently discharged home and since being home has done much better in that regard. The CT angiogram did not show any pulmonary embolism. With regard to the osteomyelitis the patient was placed on vancomycin and Zosyn while in the hospital but has been changed to Augmentin at discharge. It was also recommended that he follow-up with wound care and podiatry. Podiatry however wanted him to see [name] according to the patient prior to them doing anything further. His hemoglobin A1c was 9.9 as noted in the hospital. Have an MRI of the left foot performed while in the hospital on 02/04/2019. 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 There is an overlying plantar open wound noted an abscess tracking back along the lateral aspect of the fifth metatarsal shaft. There is otherwise diffuse cellulitis and mild fasciitis without findings of polymyositis. The patient did have recently pneumonia secondary to COVID-19 I looked in the chart through epic and it does appear that the patient may need to have an additional x-ray just to ensure everything is cleared and that he has no  airspace disease prior to putting him into the chamber. 03/05/2019; patient was readmitted to the clinic last week. He was hospitalized twice for a viral upper respiratory tract infection from 11/1 through 11/4 and then 11/5 through 11/12 ultimately this turned out to be Covid pneumonitis. Although he was discharged on oxygen he is not using it. He says he feels fine. He has no exercise limitation no cough no sputum. His O2  sat in our clinic today was 100% on room air. 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 He received Vanco and Zosyn in the hospital and then was discharged on 2 weeks of Augmentin. I do not see any relevant cultures. He was supposed to follow-up with infectious disease but I do not see that he has an appointment. 12/8; patient saw Dr. Novella Olive of infectious disease last week. He felt that he had had adequate antibiotic therapy. He did not go to follow-up with Dr. Amalia Hailey of podiatry and I have again talked to him about the pros and cons of this. He does not want to consider a ray amputation of this time. He is aware of the risks of recurrence, migration etc. He started HBO today and tolerated this well. He can complete the Augmentin that I gave him last week. I have looked over the lab work that Dr. Chana Bode ordered his C-reactive protein was 3.3 and his sedimentation rate was 17. The C-reactive protein is never really been measurably that high in this patient 12/15; not much change in the wound today however he has undermining along the lateral part of the foot again more extensively than last week. He has some rims of epithelialization. We have been using silver alginate. He is undergoing hyperbarics but did not dive today 12/18; in for his obligatory first total contact cast change. Unfortunately there was pus coming from the undermining area around his fifth metatarsal head. This was cultured but will preclude reapplication of a cast. He is seen in conjunction with HBO 12/24; patient had staph lugdunensis in the wound in the undermining area laterally last time. We put him on doxycycline which should have covered this. The wound looks better today. I am going to give him another week of doxycycline before reattempting the total contact cast 12/31; the patient is completing  antibiotics. Hemorrhagic debris in the distal part of the wound with some undermining distally. He also had hyper granulation. Extensive debridement with a #5 curette. The infected area that was on the lateral part of the fifth met head is closed over. I do not think he needs any more antibiotics. Patient was seen prior to HBO. Preparations for a total contact cast were made in the cast will be placed post hyperbarics 04/11/19; once again the patient arrives today without complaint. He had been in a cast all week noted that he had heavy drainage this week. This resulted in large raised areas of macerated tissue around the wound 1/14; wound bed looks better slightly smaller. Hydrofera Blue has been changing himself. He had a heavy drainage last week which caused a lot of maceration around the wound so I took him out of a total contact cast he says the drainage is actually better this week He is seen today in conjunction with HBO Electronic Signature(s) Signed: 04/18/2019 5:49:05 PM By: [name] MD Entered By: [name] on 04/18/2019 17:40:34 -------------------------------------------------------------------------------- Physical Exam Details Patient Name:  Date of Service: Medici, [name] 04/18/2019 4:00 PM Medical Record [id number] Patient Account Number: [id number] Date of Birth/Sex: Treating RN: 05-Nov-1985 (34 y.o. M) Primary Care Provider: Kandice [name] Other Clinician: Referring Provider: Treating Provider/Extender:Rodrecus Belsky, [name], [name] in Treatment: 52 Constitutional Patient is hypertensive.. Pulse regular and within target range for patient.Marland Kitchen Respirations regular, non-labored and within target range.. Temperature is normal and within the target range for the patient.Marland Kitchen Appears in no distress. Notes Wound exam; the patient does not have any measurable undermining. Debridement of circumferential surrounding hard thick skin which does not look like a  healthy edge Electronic Signature(s) Signed: 04/18/2019 5:49:05 PM By: [name] MD Entered By: [name] on 04/18/2019 17:38:51 -------------------------------------------------------------------------------- Physician Orders Details Patient Name: Date of Service: Stumpe, [name] 04/18/2019 4:00 PM Medical Record [id number] Patient Account Number: [id number] Date of Birth/Sex: Treating RN: 1986/03/09 (34 y.o. 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 - Thursday Dressing Change Frequency Wound #1 Left Metatarsal head fifth Change dressing every day. Skin Barriers/Peri-Wound Care Skin Prep Wound Cleansing Wound #1 Left Metatarsal head fifth May shower and wash wound with soap and water. Primary Wound Dressing Wound #1 Left Metatarsal head fifth Hydrofera Blue Secondary Dressing Wound #1 Left Metatarsal head fifth Kerlix/Rolled Gauze Dry Gauze Other: - felt Off-Loading Wedge shoe to: - left foot Electronic Signature(s) Signed: 04/18/2019 5:49:05 PM By: [name] MD Signed: 04/18/2019 6:31:26 PM By: Max Hurst RN, BSN Entered By: [name] on 04/18/2019 17:08:27 -------------------------------------------------------------------------------- Problem List Details Patient Name: Date of Service: Sirico, [name] 04/18/2019 4:00 PM Medical Record [id number] Patient Account Number:  [id number] Date of Birth/Sex: Treating RN: [birth year] (34 y.o. 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 He comes in for review of a wound over the plantar fifth metatarsal head extending into the lateral part of the foot. He was followed for this previously by his podiatrist Dr. Cornelius [name]. As the patient tells his story he went to see podiatry first for a swelling he developed on the lateral  part of his fifth metatarsal head in May. He states this was "open" by podiatry and the area closed. He was followed up in June and it was again  opened callus removed and it closed promptly. There were plans being made for surgery on the fifth metatarsal head in June however his blood sugar was apparently too high for anesthesia. Apparently the area was debrided and opened again in June and it is never closed since. Looking over the records from podiatry I am really not able to follow this. It was clear when he was first seen it was before 5/14 at that point he already had a wound. By 5/17 the ulcer was resolved. I do not see anything about a procedure. On 5/28 noted to have pre-ulcerative moderate keratosis. X-ray noted 1/5 contracted toe and tailor's bunion and metatarsal deformity. On a visit date on 09/28/2018 the dorsal part of the left foot it healed and resolved. There was concern about swelling in his lower extremity he was sent to the ER.. As far as I can tell he was seen in the ER on 7/12 with an ulcer on his left foot. A DVT rule out of the left leg was negative. I do not think I have complete records from podiatry but I am not able to verify the procedures this patient states he had. He states after the last procedure the wound has never closed although I am not able to follow this in the records I have from podiatry. He has not had a recent x-ray The patient has been using Neosporin on the wound. He is wearing a Darco shoe. He is still very active up on his foot working and exercising. Past medical history; type 2 diabetes ketosis-prone, leg swelling with a negative DVT study in July. Non-smoker ABI in our clinic was 0.85 on the left 10/16; substantial wound on the plantar left fifth met head extending laterally almost to the dorsal fifth MTP. We have been using silver alginate we gave him a Darco forefoot off loader. An x-ray did not show evidence of osteomyelitis did note soft tissue emphysema which I think was due to gas tracking through an open wound. There is no doubt in my mind he requires an MRI 10/23; MRI not booked  until 3 November at the earliest this is largely due to his glucose sensor in the right arm. We have been using silver alginate. There has been an improvement 10/29; I am still not exactly sure when his MRI is booked for. He says it is the third but it is the 10th in epic. This definitely needs to be done. He is running a low-grade fever today but no other symptoms. No real improvement in the 1 02/26/2019 patient presents today for a follow-up visit here in our clinic he is last been seen in the clinic on October 29. Subsequently we were working on getting MRI to evaluate and see what exactly was going on and where we would need to go from the standpoint of whether or not he had osteomyelitis and again what treatments were going be required. Subsequently the patient ended up being admitted to the hospital on 02/07/2019 and was discharged on 02/14/2019. This is a somewhat interesting admission with a discharge diagnosis of pneumonia due to COVID-19 although he was positive for COVID-19 when tested at the urgent care but negative x2 when he was actually in the hospital. With that being said he did have acute respiratory failure with hypoxia and it  was noted he also have a left foot ulceration with osteomyelitis. With that being said he did require oxygen for his pneumonia and I level 4 L. He was placed on antivirals and steroids for the COVID-19. He was also transferred to the [facility] at one point. Nonetheless he did subsequently discharged home and since being home has done much better in that regard. The CT angiogram did not show any pulmonary embolism. With regard to the osteomyelitis the patient was placed on vancomycin and Zosyn while in the hospital but has been changed to Augmentin at discharge. It was also recommended that he follow-up with wound care and podiatry. Podiatry however wanted him to see [name] according to the patient prior to them doing anything further. His hemoglobin  A1c was 9.9 as noted in the hospital. Have an MRI of the left foot performed while in the hospital on 02/04/2019. 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 There is an overlying plantar open wound noted an abscess tracking back along the lateral aspect of the fifth metatarsal shaft. There is otherwise diffuse cellulitis and mild fasciitis without findings of polymyositis. The patient did have recently pneumonia secondary to COVID-19 I looked in the chart through epic and it does appear that the patient may need to have an additional x-ray just to ensure everything is cleared and that he has no airspace disease prior to putting him into the chamber. 03/05/2019; patient was readmitted to the clinic last week. He was hospitalized twice for a viral upper respiratory tract infection from 11/1 through 11/4 and then 11/5 through 11/12 ultimately this turned out to be Covid pneumonitis. Although he was discharged on oxygen he is not using it. He says he feels fine. He has no exercise limitation no cough no sputum. His O2 sat in our clinic today was 100% on room air. 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 He received Vanco and Zosyn in the hospital and then was discharged on 2 weeks of Augmentin. I do not see any relevant cultures. He was supposed to follow-up with infectious disease but I do not see that he has an appointment. 12/8; patient saw Dr. Novella Olive of infectious disease last week. He felt that he had had adequate antibiotic therapy. He did not go to follow-up with Dr. Amalia Hailey of podiatry and I have again talked to him about the pros and cons of this. He does not want to consider a ray amputation of this time. He is aware of the risks of recurrence, migration etc. He started HBO today and  tolerated this well. He can complete the Augmentin that I gave him last week. I have looked over the lab work that Dr. Chana Bode ordered his C-reactive protein was 3.3 and his sedimentation rate was 17. The C-reactive protein is never really been measurably that high in this patient 12/15; not much change in the wound today however he has undermining along the lateral part of the foot again more extensively than last week. He has some rims of epithelialization. We have been using silver alginate. He is undergoing hyperbarics but did not dive today 12/18; in for his obligatory first total contact cast change. Unfortunately there was pus coming from the undermining area around his fifth metatarsal head. This was cultured but will preclude reapplication of a  cast. He is seen in conjunction with HBO 12/24; patient had staph lugdunensis in the wound in the undermining area laterally last time. We put him on doxycycline which should have covered this. The wound looks better today. I am going to give him another week of doxycycline before reattempting the total contact cast 12/31; the patient is completing antibiotics. Hemorrhagic debris in the distal part of the wound with some undermining distally. He also had hyper granulation. Extensive debridement with a #5 curette. The infected area that was on the lateral part of the fifth met head is closed over. I do not think he needs any more antibiotics. Patient was seen prior to HBO. Preparations for a total contact cast were made in the cast will be placed post hyperbarics 04/11/19; once again the patient arrives today without complaint. He had been in a cast all week noted that he had heavy drainage this week. This resulted in large raised areas of macerated tissue around the wound 1/14; wound bed looks better slightly smaller. Hydrofera Blue has been changing himself. He had a heavy drainage last week which caused a lot of maceration around the wound so I  took him out of a total contact cast he says the drainage is actually better this week Objective Constitutional Patient is hypertensive.. Pulse regular and within target range for patient.Marland Kitchen Respirations regular, non-labored and within target range.. Temperature is normal and within the target range for the patient.Marland Kitchen Appears in no distress. Vitals Time Taken: 4:29 AM, Height: 77 in, Weight: 260 lbs, BMI: 30.8, Temperature: 98.5 F, Pulse: 82 bpm, Respiratory Rate: 18 breaths/min, Blood Pressure: 144/95 mmHg. General Notes: Wound exam; the patient does not have any measurable undermining. Debridement of circumferential surrounding hard thick skin which does not look like a healthy edge Integumentary (Hair, Skin) Wound #1 status is Open. Original cause of wound was Trauma. The wound is located on the Left Metatarsal head fifth. The wound measures 2.5cm length x 2.5cm width x 0.3cm depth; 4.909cm^2 area and 1.473cm^3 volume. There is Fat Layer (Subcutaneous Tissue) Exposed exposed. There is no tunneling or undermining noted. There is a medium amount of serosanguineous drainage noted. The wound margin is well defined and not attached to the wound base. There is large (67-100%) pink granulation within the wound bed. There is a small (1-33%) amount of necrotic tissue within the wound bed including Adherent Slough. 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 There was a Selective/Open Wound Non-Viable Tissue Debridement with a total area of 6.25 sq  cm performed by [name]., MD. With the following instrument(s): Curette to remove Non-Viable tissue/material. Material removed includes Callus. No specimens were taken. A time out was conducted at 17:05, prior to the start of the procedure. A Minimum amount of bleeding was controlled with Pressure. The procedure was tolerated well with a pain level of 0 throughout and a pain level of 0 following the procedure. Post Debridement Measurements: 2.5cm length x 2.5cm width x 0.3cm depth; 1.473cm^3 volume. Character of Wound/Ulcer Post Debridement is improved. Severity of Tissue Post Debridement is: Fat layer exposed. Post procedure Diagnosis  Wound #1: Same as Pre-Procedure Plan Follow-up Appointments: Return Appointment in 1 week. - Thursday Dressing Change Frequency: Wound #1 Left Metatarsal head fifth: Change dressing every day. Skin Barriers/Peri-Wound Care: Skin Prep Wound Cleansing: Wound #1 Left Metatarsal head fifth: May shower and wash wound with soap and water. Primary Wound Dressing: Wound #1 Left Metatarsal head fifth: Hydrofera Blue Secondary Dressing: Wound #1 Left Metatarsal head fifth: Kerlix/Rolled Gauze Dry Gauze Other: - felt Off-Loading: Wedge shoe to: - left foot 1. The patient will continue with hyperbaric oxygen. 2. The wound is making good improvements in terms of any L element of infection. 3. Still using Hydrofera Blue and a wedge shoe to the left foot. Electronic Signature(s) Signed: 04/18/2019 5:49:05 PM By: [name] MD Entered By: [name] on 04/18/2019 17:39:34 -------------------------------------------------------------------------------- SuperBill Details Patient Name: Date of Service: Venturini, [name] 04/18/2019 Medical Record [id number] Patient Account Number: [id number] Date of Birth/Sex: Treating RN: Aug 23, 1985 (34 y.o. M) Primary Care Provider: Kandice [name] Other Clinician: Referring Provider: Treating  Provider/Extender:Dahmir Epperly, [name], [name] in Treatment: 13 Diagnosis Coding ICD-10 Codes Code Description E11.621 Type 2 diabetes mellitus with foot ulcer L97.521 Non-pressure chronic ulcer of other part of left foot limited to breakdown of skin E11.42 Type 2 diabetes mellitus with diabetic polyneuropathy M86.672 Other chronic osteomyelitis, left ankle and foot M00.9 Pyogenic arthritis, unspecified Facility Procedures CPT4 Code Description: [id number] - DEBRIDE WOUND 1ST 20 SQ CM OR < ICD-10 Diagnosis Description L97.521 Non-pressure chronic ulcer of other part of left foot limited Modifier: to breakdo Quantity: 1 wn of skin Physician Procedures CPT4 Code Description: [id number] - WC PHYS DEBR WO ANESTH 20 SQ CM ICD-10 Diagnosis Description L97.521 Non-pressure chronic ulcer of other part of left foot limited Modifier: to breakdow Quantity: 1 n of skin Electronic Signature(s) Signed: 04/18/2019 5:49:05 PM By: [name] MD Entered By: [name] on 04/18/2019 17:39:48

## 2019-04-18 NOTE — Progress Notes (Signed)
Spon[place]le, [name] ([id number]) Visit Report for 04/18/2019 SuperBill Details Patient Name: Date of Service: [name], [name] 04/18/2019 Medical Record Number:[id number] Patient Account Number: [id number] Date of Birth/Sex: Treating RN: [birth year] (34 y.o. M) Primary Care Provider: Katy Apo Other Clinician: [name] Referring Provider: Treating Provider/Extender:Artie Mcintyre, Almira Coaster, Chalmers Guest in Treatment: 13 Diagnosis Coding ICD-10 Codes Code Description E11.621 Type 2 diabetes mellitus with foot ulcer L97.521 Non-pressure chronic ulcer of other part of left foot limited to breakdown of skin E11.42 Type 2 diabetes mellitus with diabetic polyneuropathy M86.672 Other chronic osteomyelitis, left ankle and foot M00.9 Pyogenic arthritis, unspecified Facility Procedures CPT4 Code Description Modifier Quantity [id number] G0277-(Facility Use Only) HBOT, full body chamber, 4 Physician Procedures CPT4 Code Description Modifier Quantity 4103013 99183 - WC PHYS HYPERBARIC OXYGEN THERAPY 1 ICD-10 Diagnosis Description E11.621 Type 2 diabetes mellitus with foot ulcer Electronic Signature(s) Signed: 04/18/2019 3:33:56 PM By: [name] EMT/HBOT Signed: 04/18/2019 5:49:05 PM By: Baltazar Najjar MD Entered By: [name] on 04/18/2019 13:22:34

## 2019-04-19 ENCOUNTER — Encounter (HOSPITAL_BASED_OUTPATIENT_CLINIC_OR_DEPARTMENT_OTHER): Payer: BC Managed Care – PPO | Admitting: Internal Medicine

## 2019-04-19 ENCOUNTER — Other Ambulatory Visit: Payer: Self-pay

## 2019-04-19 DIAGNOSIS — E11621 Type 2 diabetes mellitus with foot ulcer: Secondary | ICD-10-CM | POA: Diagnosis not present

## 2019-04-19 LAB — GLUCOSE, CAPILLARY
Glucose-Capillary: 201 mg/dL — ABNORMAL HIGH (ref 70–99)
Glucose-Capillary: 159 mg/dL — ABNORMAL HIGH (ref 70–99)

## 2019-04-19 NOTE — Progress Notes (Addendum)
Fa[place], [name] ([id number]) Visit Report for 04/19/2019 HBO Details Patient Name: Date of Service: [name], [name] 04/19/2019 10:00 AM Medical Record Number:[id number] Patient Account Number: [id number] Date of Birth/Sex: Treating RN: [birth year] (33 y.o. M) Primary Care Jaimee Corum: Katy Apo Other Clinician: [name] Referring Ajit Errico: Treating Kameria Canizares/Extender:Robson, Almira Coaster, Chalmers Guest in Treatment: 14 HBO Treatment Course Details Treatment Course Number: 1 Ordering Aris Even: Baltazar Najjar Total Treatments Ordered: 40 HBO Treatment Start Date: 03/12/2019 HBO Indication: Diabetic Ulcer(s) of the Lower Extremity HBO Treatment Details Treatment Number: 23 Patient Type: Outpatient Chamber Type: Monoplace Chamber Serial #: [id number] Treatment Protocol: 2.0 ATA with 90 minutes oxygen, and no air breaks Treatment Details Compression Rate Down: 2.0 psi / minute De-Compression Rate Up: 2.0 psi / minute Air breaks and CompressTx Pressure breathing DecompressDecompress Begins Reached periods Begins Ends (leave unused spaces blank) Chamber Pressure (ATA)1 2 ------2 1 Clock Time (24 hr) 10:45 10:57 - - - - - - 11:36 11:41 Treatment Length: 56 (minutes) Treatment Segments: 2 Vital Signs Capillary Blood Glucose Reference Range: 80 - 120 mg / dl HBO Diabetic Blood Glucose Intervention Range: <131 mg/dl or >893 mg/dl Time Vitals Blood Respiratory Capillary Blood Glucose Pulse Action Type: Pulse: Temperature: Taken: Pressure: Rate: Glucose (mg/dl): Meter #: Oximetry (%) Taken: Pre 10:35 129/80 108 16 98 201 Post 11:50 119/84 115 18 98.2 159 Treatment Response Treatment Toleration: Well Adverse Events: 1:Other - bowel emergency Treatment Completion Treatment Aborted/Not Restarted Treatment Aborted/Not Restarted Status: Reason: Adverse Event Additional Procedure Documentation Tissue Sevierity: Necrosis of bone Jamari Diana Notes No concerns with  treatment given Physician HBO Attestation: I certify that I supervised this HBO treatment in accordance with Medicare guidelines. A trained Yes emergency response team is readily available per hospital policies and procedures. Continue HBOT as ordered. Yes Electronic Signature(s) Signed: 04/19/2019 6:20:19 PM By: Baltazar Najjar MD Entered By: Baltazar Najjar on 04/19/2019 18:17:34 -------------------------------------------------------------------------------- HBO Safety Checklist Details Patient Name: Date of Service: [name] 04/19/2019 10:00 AM Medical Record Number:[id number] Patient Account Number: [id number] Date of Birth/Sex: Treating RN: 1985/10/27 (33 y.o. M) Primary Care Laquanda Bick: Katy Apo Other Clinician: [name] Referring Salahuddin Arismendez: Treating Tannor Pyon/Extender:Robson, Almira Coaster, Chalmers Guest in Treatment: 14 HBO Safety Checklist Items Safety Checklist Consent Form Signed Patient voided / foley secured and emptied When did you last eato 0900 - juice Last dose of injectable or oral agent insulin NA Ostomy pouch emptied and vented if applicable All implantable devices assessed, documented and approved NA Intravenous access site secured and place Valuables secured Linens and cotton and cotton/polyester blend (less than 51% polyester) Personal oil-based products / skin lotions / body lotions removed NA Wigs or hairpieces removed NA Smoking or tobacco materials removed Books / newspapers / magazines / loose paper removed Cologne, aftershave, perfume and deodorant removed Jewelry removed (may wrap wedding band) NA Make-up removed Hair care products removed NA Battery operated devices (external) removed NA Heating patches and chemical warmers removed Titanium eyewear removed NA Nail polish cured greater than 10 hours NA Casting material cured greater than 10 hours NA Hearing aids removed NA Loose dentures or partials  removed NA Prosthetics have been removed NA Patient demonstrates correct use of air break device (if applicable) Patient concerns have been addressed Patient grounding bracelet on and cord attached to chamber Specifics for Inpatients (complete in addition to above) Medication sheet sent with patient Intravenous medications needed or due during therapy sent with patient Drainage tubes (e.g. nasogastric tube or chest tube secured and vented) Endotracheal  or Tracheotomy tube secured Cuff deflated of air and inflated with saline Airway suctioned Electronic Signature(s) Signed: 04/19/2019 11:16:00 AM By: [name] EMT/HBOT Entered By: [name] on 04/19/2019 11:15:59

## 2019-04-22 ENCOUNTER — Encounter (HOSPITAL_BASED_OUTPATIENT_CLINIC_OR_DEPARTMENT_OTHER): Payer: BC Managed Care – PPO | Admitting: Internal Medicine

## 2019-04-22 ENCOUNTER — Ambulatory Visit (HOSPITAL_COMMUNITY)
Admission: EM | Admit: 2019-04-22 | Discharge: 2019-04-22 | Disposition: A | Discharge status: Home / Self Care | Payer: BC Managed Care – PPO

## 2019-04-22 ENCOUNTER — Other Ambulatory Visit: Payer: Self-pay

## 2019-04-23 ENCOUNTER — Encounter (HOSPITAL_BASED_OUTPATIENT_CLINIC_OR_DEPARTMENT_OTHER): Payer: BC Managed Care – PPO | Admitting: Internal Medicine

## 2019-04-23 NOTE — Progress Notes (Signed)
[place], [name] ([id number]) Visit Report for 04/19/2019 Arrival Information Details Patient Name: Date of Service: [place], [name] 04/19/2019 10:00 AM Medical Record Number:[id number] Patient Account Number: [id number] Date of Birth/Sex: Treating RN: Jun 20, 1985 (34 y.o. M) Primary Care Cordero Surette: Katy Apo Other Clinician: [name] Referring Kairee Isa: Treating Tatum Massman/Extender:Robson, Almira Coaster, Chalmers Guest in Treatment: 14 Visit Information History Since Last Visit Added or deleted any medications: No Patient Arrived: Ambulatory Any new allergies or adverse reactions: No Arrival Time: 10:30 Had a fall or experienced change in No Accompanied By: self activities of daily living that may affect Transfer Assistance: None risk of falls: Patient Identification Verified: Yes Signs or symptoms of abuse/neglect since last No Secondary Verification Process Yes visito Completed: Hospitalized since last visit: No Patient Requires Transmission-Based No Implantable device outside of the clinic excluding No Precautions: cellular tissue based products placed in the center Patient Has Alerts: No since last visit: Pain Present Now: No Electronic Signature(s) Signed: 04/23/2019 4:37:08 PM By: [name] EMT/HBOT Entered By: [name] on 04/19/2019 11:14:57 -------------------------------------------------------------------------------- Encounter Discharge Information Details Patient Name: Date of Service: [place], [name] 04/19/2019 10:00 AM Medical Record Number:[id number] Patient Account Number: [id number] Date of Birth/Sex: Treating RN: 06-10-1985 (34 y.o. M) Primary Care Yacoub Diltz: Katy Apo Other Clinician: [name] Referring Melanie Openshaw: Treating Taquilla Downum/Extender:Robson, Almira Coaster, Chalmers Guest in Treatment: 14 Encounter Discharge Information Items Discharge Condition: Stable Ambulatory Status: Ambulatory Discharge Destination:  Home Transportation: Private Auto Accompanied By: self Schedule Follow-up Appointment: Yes Clinical Summary of Care: Patient Declined Electronic Signature(s) Signed: 04/23/2019 4:37:08 PM By: [name] EMT/HBOT Entered By: [name] on 04/19/2019 13:20:33 -------------------------------------------------------------------------------- Patient/Caregiver Education Details Patient Name: [place], [name] 1/15/2021andnbsp10:00 Date of Service: AM Medical Record [id number] Number: Patient Account Number: [id number] Treating RN: April 06, 1985 (34 y.o. Date of Birth/Gender: M) Other Clinician: [name] Primary Care Physician: Katy Apo Treating Baltazar Najjar Referring Physician: Physician/Extender: Renee Ramus in Treatment: 14 Education Assessment Education Provided To: Patient Education Topics Provided Hyperbaric Oxygenation: Methods: Explain/Verbal Responses: State content correctly Electronic Signature(s) Signed: 04/23/2019 4:37:08 PM By: [name] EMT/HBOT Entered By: [name] on 04/19/2019 13:20:18 -------------------------------------------------------------------------------- Vitals Details Patient Name: Date of Service: [place], [name] 04/19/2019 10:00 AM Medical Record Number:[id number] Patient Account Number: [id number] Date of Birth/Sex: Treating RN: [birth year] (34 y.o. M) Primary Care Zonnique Norkus: Katy Apo Other Clinician: [name] Referring Zlata Alcaide: Treating Cale Decarolis/Extender:Robson, Almira Coaster, Chalmers Guest in Treatment: 14 Vital Signs Time Taken: 10:35 Temperature (F): 98 Height (in): 77 Pulse (bpm): 108 Weight (lbs): 260 Respiratory Rate (breaths/min): 16 Body Mass Index (BMI): 30.8 Blood Pressure (mmHg): 129/80 Capillary Blood Glucose (mg/dl): 782 Reference Range: 80 - 120 mg / dl Electronic Signature(s) Signed: 04/23/2019 4:37:08 PM By: [name] EMT/HBOT Entered By: [name] on  04/19/2019 11:15:19

## 2019-04-23 NOTE — Progress Notes (Signed)
[place], [name] ([id number]) Visit Report for 04/19/2019 SuperBill Details Patient Name: Date of Service: [place], [name] 04/19/2019 Medical Record Number:[id number] Patient Account Number: [id number] Date of Birth/Sex: Treating RN: Feb 09, 1986 (34 y.o. M) Primary Care Provider: Katy Apo Other Clinician: [name] Referring Provider: Treating Provider/Extender:Athene Schuhmacher, Almira Coaster, Chalmers Guest in Treatment: 14 Diagnosis Coding ICD-10 Codes Code Description E11.621 Type 2 diabetes mellitus with foot ulcer L97.521 Non-pressure chronic ulcer of other part of left foot limited to breakdown of skin E11.42 Type 2 diabetes mellitus with diabetic polyneuropathy M86.672 Other chronic osteomyelitis, left ankle and foot M00.9 Pyogenic arthritis, unspecified Facility Procedures CPT4 Code Description Modifier Quantity [id number] G0277-(Facility Use Only) HBOT, full body chamber, 2 Physician Procedures CPT4 Code Description Modifier Quantity 7078675 99183 - WC PHYS HYPERBARIC OXYGEN THERAPY 1 ICD-10 Diagnosis Description E11.621 Type 2 diabetes mellitus with foot ulcer Electronic Signature(s) Signed: 04/19/2019 6:20:19 PM By: Baltazar Najjar MD Signed: 04/23/2019 4:37:08 PM By: [name] EMT/HBOT Entered By: [name] on 04/19/2019 13:20:04

## 2019-04-24 ENCOUNTER — Encounter (HOSPITAL_BASED_OUTPATIENT_CLINIC_OR_DEPARTMENT_OTHER): Payer: BC Managed Care – PPO | Admitting: Physician Assistant

## 2019-04-25 ENCOUNTER — Encounter (HOSPITAL_BASED_OUTPATIENT_CLINIC_OR_DEPARTMENT_OTHER): Payer: BC Managed Care – PPO | Admitting: Internal Medicine

## 2019-04-25 ENCOUNTER — Other Ambulatory Visit: Payer: Self-pay

## 2019-04-25 ENCOUNTER — Other Ambulatory Visit (HOSPITAL_BASED_OUTPATIENT_CLINIC_OR_DEPARTMENT_OTHER): Payer: Self-pay | Admitting: Internal Medicine

## 2019-04-25 DIAGNOSIS — E11621 Type 2 diabetes mellitus with foot ulcer: Secondary | ICD-10-CM | POA: Diagnosis not present

## 2019-04-25 NOTE — Progress Notes (Signed)
[name] [name] ([id number]) Visit Report for 04/25/2019 Debridement Details Patient Name: Date of Service: [name] [name] 04/25/2019 4:00 PM Medical Record [id number] Patient Account Number: [id number] Date of Birth/Sex: Treating RN: 07/18/1985 (34 y.o. [name], Meta.Reding Primary Care Provider: [name] Other Clinician: Referring Provider: Treating Provider/Extender:Ameyah Bangura, [name], [name]: 14 Debridement Performed for Wound #1 Left Metatarsal head fifth Assessment: Performed By: Physician [name], MD Debridement Type: Debridement Severity of Tissue Pre Bone involvement without necrosis Debridement: Level of Consciousness (Pre- Awake and Alert procedure): Pre-procedure Verification/Time Out Taken: Yes - 16:48 Start Time: 16:49 Pain Control: Lidocaine 4% Topical Solution Total Area Debrided (L x W): 2 (cm) x 1 (cm) = 2 (cm) Tissue and other material Viable, Non-Viable, Bone, Callus, Subcutaneous, Skin: Dermis , Fibrin/Exudate debrided: Level: Skin/Subcutaneous Tissue/Muscle/Bone Debridement Description: Excisional Instrument: Blade, Forceps Specimen: Swab, Tissue Culture Number of Specimens Taken: 2 Bleeding: Minimum Hemostasis Achieved: Pressure End Time: 16:51 Procedural Pain: 0 Post Procedural Pain: 0 Response to Treatment: Procedure was tolerated well Level of Consciousness Awake and Alert (Post-procedure): Post Debridement Measurements of Total Wound Length: (cm) 2.8 Width: (cm) 2.7 Depth: (cm) 0.6 Volume: (cm) 3.563 Character of Wound/Ulcer Post Requires Further Debridement Debridement: Severity of Tissue Post Debridement: Bone involvement without necrosis Post Procedure Diagnosis Same as Pre-procedure Electronic Signature(s) Signed: 04/25/2019 5:54:58 PM By: [name] MD Signed: 04/25/2019 6:20:01 PM By: Deon Pilling Entered By: Deon Pilling on 04/25/2019  17:12:07 -------------------------------------------------------------------------------- HPI Details Patient Name: Date of Service: [name] [name] 04/25/2019 4:00 PM Medical Record Number:[id number] Patient Account Number: [id number] Date of Birth/Sex: Treating RN: [birth year] (34 y.o. 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 He comes in for review of a wound over the plantar fifth metatarsal head extending into the lateral part of the foot. He was followed for this previously by his podiatrist Dr. Cornelius Moras. As the patient tells his story he went to see podiatry first for a swelling he developed on the lateral part of his fifth metatarsal head in May. He states this was "open" by podiatry and the area closed. He was followed up in June and it was again opened callus removed and it closed promptly. There were plans being made for surgery on the fifth metatarsal head in June however his blood sugar was apparently too high for anesthesia. Apparently the area was debrided and opened again in June and it is never closed since. Looking over the records from podiatry I am really not able to follow this. It was clear when he was first seen it was before 5/14 at that point he already had a wound. By 5/17 the ulcer was resolved. I do not see anything about a procedure. On 5/28 noted to have pre-ulcerative moderate keratosis. X-ray noted 1/5 contracted toe and tailor's bunion and metatarsal deformity. On a visit date on 09/28/2018 the dorsal part of the left foot it healed and resolved. There was concern about swelling in his lower extremity he was sent to the ER.. As far as I can tell he was seen in the ER on 7/12 with an ulcer on his  left foot. A DVT rule out of the left leg was negative. I do not think I have complete records from podiatry but I am not able to verify  the procedures this patient states he had. He states after the last procedure the wound has never closed although I am not able to follow this in the records I have from podiatry. He has not had a recent x-ray The patient has been using Neosporin on the wound. He is wearing a Darco shoe. He is still very active up on his foot working and exercising. Past medical history; type 2 diabetes ketosis-prone, leg swelling with a negative DVT study in July. Non-smoker ABI in our clinic was 0.85 on the left 10/16; substantial wound on the plantar left fifth met head extending laterally almost to the dorsal fifth MTP. We have been using silver alginate we gave him a Darco forefoot off loader. An x-ray did not show evidence of osteomyelitis did note soft tissue emphysema which I think was due to gas tracking through an open wound. There is no doubt in my mind he requires an MRI 10/23; MRI not booked until 3 November at the earliest this is largely due to his glucose sensor in the right arm. We have been using silver alginate. There has been an improvement 10/29; I am still not exactly sure when his MRI is booked for. He says it is the third but it is the 10th in epic. This definitely needs to be done. He is running a low-grade fever today but no other symptoms. No real improvement in the 1 02/26/2019 patient presents today for a follow-up visit here in our clinic he is last been seen in the clinic on October 29. Subsequently we were working on getting MRI to evaluate and see what exactly was going on and where we would need to go from the standpoint of whether or not he had osteomyelitis and again what treatments were going be required. Subsequently the patient ended up being admitted to the hospital on 02/07/2019 and was discharged on 02/14/2019. This is a somewhat  interesting admission with a discharge diagnosis of pneumonia due to COVID-19 although he was positive for COVID-19 when tested at the urgent care but negative x2 when he was actually in the hospital. With that being said he did have acute respiratory failure with hypoxia and it was noted he also have a left foot ulceration with osteomyelitis. With that being said he did require oxygen for his pneumonia and I level 4 L. He was placed on antivirals and steroids for the COVID-19. He was also transferred to the [facility] at one point. Nonetheless he did subsequently discharged home and since being home has done much better in that regard. The CT angiogram did not show any pulmonary embolism. With regard to the osteomyelitis the patient was placed on vancomycin and Zosyn while in the hospital but has been changed to Augmentin at discharge. It was also recommended that he follow-up with wound care and podiatry. Podiatry however wanted him to see [name] according to the patient prior to them doing anything further. His hemoglobin A1c was 9.9 as noted in the hospital. Have an MRI of the left foot performed while in the hospital on 02/04/2019. 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 There is an overlying plantar open wound noted an abscess tracking back along the lateral aspect of the fifth metatarsal shaft. There is otherwise diffuse cellulitis and mild fasciitis without findings of polymyositis. The patient did have recently pneumonia secondary to COVID-19 I looked in the chart through epic and  it does appear that the patient may need to have an additional x-ray just to ensure everything is cleared and that he has no airspace disease prior to putting him into the chamber. 03/05/2019; patient was readmitted to the clinic last week. He was hospitalized twice for a viral upper respiratory tract infection  from 11/1 through 11/4 and then 11/5 through 11/12 ultimately this turned out to be Covid pneumonitis. Although he was discharged on oxygen he is not using it. He says he feels fine. He has no exercise limitation no cough no sputum. His O2 sat in our clinic today was 100% on room air. 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 He received Vanco and Zosyn in the hospital and then was discharged on 2 weeks of Augmentin. I do not see any relevant cultures. He was supposed to follow-up with infectious disease but I do not see that he has an appointment. 12/8; patient saw Dr. Novella Olive of infectious disease last week. He felt that he had had adequate antibiotic therapy. He did not go to follow-up with Dr. Amalia Hailey of podiatry and I have again talked to him about the pros and cons of this. He does not want to consider a ray amputation of this time. He is aware of the risks of recurrence, migration etc. He started HBO today and tolerated this well. He can complete the Augmentin that I gave him last week. I have looked over the lab work that Dr. Chana Bode ordered his C-reactive protein was 3.3 and his sedimentation rate was 17. The C-reactive protein is never really been measurably that high in this patient 12/15; not much change in the wound today however he has undermining along the lateral part of the foot again more extensively than last week. He has some rims of epithelialization. We have been using silver alginate. He is undergoing hyperbarics but did not dive today 12/18; in for his obligatory first total contact cast change. Unfortunately there was pus coming from the undermining area around his fifth metatarsal head. This was cultured but will preclude reapplication of a cast. He is seen in conjunction with HBO 12/24; patient had staph lugdunensis in the wound in the undermining area  laterally last time. We put him on doxycycline which should have covered this. The wound looks better today. I am going to give him another week of doxycycline before reattempting the total contact cast 12/31; the patient is completing antibiotics. Hemorrhagic debris in the distal part of the wound with some undermining distally. He also had hyper granulation. Extensive debridement with a #5 curette. The infected area that was on the lateral part of the fifth met head is closed over. I do not think he needs any more antibiotics. Patient was seen prior to HBO. Preparations for a total contact cast were made in the cast will be placed post hyperbarics 04/11/19; once again the patient arrives today without complaint. He had been in a cast all week noted that he had heavy drainage this week. This resulted in large raised areas of macerated tissue around the wound 1/14; wound bed looks better slightly smaller. Hydrofera Blue has been changing himself. He had a heavy drainage last week which caused a lot of maceration around the wound so I took him out of a total contact cast he says the drainage is actually better this week He is seen today in conjunction with  HBO 1/21; returns to clinic. He was up in [place] for a day or 2 attending a funeral. He comes back in with the wound larger and with a large area of exposed bone. He had osteomyelitis and septic arthritis of the fifth left metatarsal head while he was in hospital. He received IV antibiotics in the hospital for a prolonged period of time then 3 weeks of Augmentin. Subsequently I gave him 2 weeks of doxycycline for more superficial wound infection. When I saw this last week the wound was smaller the surface of the wound looks satisfactory. Electronic Signature(s) Signed: 04/25/2019 5:54:58 PM By: [name] MD Entered By: [name] on 04/25/2019  17:14:18 -------------------------------------------------------------------------------- Physical Exam Details Patient Name: Date of Service: [name] [name] 04/25/2019 4:00 PM Medical Record [id number] Patient Account Number: [id number] Date of Birth/Sex: Treating RN: [birth year] (34 y.o. M) Primary Care Provider: [name] Other Clinician: Referring Provider: Treating Provider/Extender:Lakesha Levinson, [name], [name]: 14 Constitutional Sitting or standing Blood Pressure is within target range for patient.. Pulse regular and within target range for patient.Marland Kitchen Respirations regular, non-labored and within target range.. Temperature is normal and within the target range for the patient.Marland Kitchen Appears in no distress. Respiratory work of breathing is normal. Bilateral breath sounds are clear and equal in all lobes with no wheezes, rales or rhonchi.. Cardiovascular Heart rhythm and rate regular, without murmur or gallop. JVP is not elevated. Patient has 3-4+ pitting edema of both legs to the knees. No evidence of a DVT. Notes Wound exam; he comes in today with undermining towards the medial part of his foot. The wound is larger in the middle of this is exposed bone which is obviously is fifth metatarsal head. I anesthetized the area took a specimen for culture and pathology. I suspect this is residual untreated osteomyelitis. Notable for the fact that he had septic arthritis as well. Electronic Signature(s) Signed: 04/25/2019 5:54:58 PM By: [name] MD Entered By: [name] on 04/25/2019 17:15:55 -------------------------------------------------------------------------------- Physician Orders Details Patient Name: Date of Service: Vila, [name] 04/25/2019 4:00 PM Medical Record [id number] Patient Account Number: [id number] Date of Birth/Sex: Treating RN: 1985/11/17 (34 y.o. 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 - Thursday Dressing Change Frequency Wound #1 Left Metatarsal head fifth Change dressing every day. Skin Barriers/Peri-Wound Care Skin Prep Wound Cleansing Wound #1 Left Metatarsal head fifth May shower and wash wound with soap and water. Primary Wound Dressing Wound #1 Left Metatarsal head fifth Hydrofera Blue Secondary Dressing Wound #1 Left Metatarsal head fifth Kerlix/Rolled Gauze Dry Gauze Other: - felt Off-Loading Wedge shoe to: - left foot Laboratory Bacteria identified in Tissue by Biopsy culture (MICRO) - biopsy excisional of bone left foot. - (ICD10 E11.621 - Type 2 diabetes mellitus with foot ulcer) LOINC Code: 62694-8 Convenience Name: Biopsy specimen culture Bacteria identified in Unspecified specimen by Aerobe culture (MICRO) - culture of left foot bone. - (ICD10 E11.621 - Type 2 diabetes mellitus with foot ulcer) LOINC Code: 546-2 Convenience Name: Areobic culture-specimen not specified C reactive protein [Mass/volume]  in Serum or Plasma (CHEM) - patient to have lab drawn any outpatient [facility] facility. - (ICD10 E11.621 - Type 2 diabetes mellitus with foot ulcer) LOINC Code: 1988-5 Convenience Name: C Reactive Protein in serum or plasma CBC W Reflex Manual Differential panel in Blood (HEM-CBC) - patient to have lab drawn any outpatient [facility] facility. - (ICD10 E11.621 - Type 2 diabetes mellitus with foot ulcer) LOINC  Code: 16109-6 Convenience Name: CBC W Reflex Manual Differential panel Hemoglobin A1c (glycated HgB)/Hemoglobin.total in Blood (CHEM) - patient to have lab drawn any outpatient [facility] facility. - (ICD10 E11.621 - Type 2 diabetes mellitus with foot ulcer) LOINC Code: 0454-0 Convenience Name: Hb A1c -Method not specified Comprehensive 2000 panel in Serum or Plasma (CHEM-panel) - patient to have lab drawn any outpatient [facility] facility. - (ICD10 E11.621 - Type 2 diabetes mellitus with foot ulcer) LOINC Code: 98119-1 Convenience Name: Comprehensive metabolic panel=CMS Erythrocyte sedimentation rate (HEM) - patient to have lab drawn any outpatient [facility] facility. - (ICD10 E11.621 - Type 2 diabetes mellitus with foot ulcer) LOINC Code: 47829-5 Convenience Name: Sed rate-method unspecified Electronic Signature(s) Signed: 04/25/2019 5:54:58 PM By: [name] MD Signed: 04/25/2019 6:20:01 PM By: Deon Pilling Entered By: Deon Pilling on 04/25/2019 17:08:11 -------------------------------------------------------------------------------- Prescription 04/25/2019 Patient Name: Tippens, [name] Provider: [name] MD Date of Birth: 1985-04-26 NPI#: [id number] Sex: M DEA#: [id number] Phone #: [phone] License #: [id number] Patient Address: [place] [address] [place], [place] [address] [place], [place] [address] [phone] Allergies metformin Provider's Orders C reactive protein [Mass/volume] in Serum or Plasma - ICD10: E11.621 - patient to have lab drawn any outpatient [facility]. LOINC Code: [phone] Convenience Name: C Reactive Protein in serum or plasma Signature(s): Date(s): Prescription 04/25/2019 Patient Name: [name] [name] Provider: [name] MD Date of Birth: March 18, 1986 NPI#: [id number] Sex: Jerilynn Mages DEA#: [id number] Phone #: [phone] License #: [id number] Patient Address:  [place] [address] [place], [place] [address] [place], [place] [address] [phone] Allergies metformin Provider's Orders CBC W Reflex Manual Differential panel in Blood - ICD10: E11.621 - patient to have lab drawn any outpatient [facility] facility. LOINC Code: [id number] Convenience Name: CBC W Reflex Manual Differential panel Signature(s): Date(s): Prescription 04/25/2019 Patient Name: [name] [name] Provider: [name] MD Date of Birth: 07/05/1985 NPI#: [id number] Sex: Jerilynn Mages DEA#: [id number] Phone #: [phone] License #: [id number] Patient Address: [place] [address] [place], [place] [address] [place], [place] [address] [phone] Allergies metformin Provider's Orders Hemoglobin A1c (glycated HgB)/Hemoglobin.total in Blood - ICD10: E11.621 - patient to have lab drawn any outpatient [facility] facility. LOINC Code: [phone] Convenience Name: Hb A1c -Method not specified Signature(s): Date(s): Prescription 04/25/2019 Patient Name: [name] [name] Provider: [name] MD Date of Birth: 1985/05/22 NPI#: [id number] Sex: Jerilynn Mages DEA#: [id number] Phone #: [phone] License #: [id number] Patient Address: [place] [address] [place], [place] [address] [place], [place] [address] [phone] Allergies metformin Provider's Orders Comprehensive 2000 panel in Serum or Plasma - ICD10: E11.621 - patient to have lab drawn any outpatient [facility]. LOINC Code: 563 218 6891 Convenience Name: Comprehensive metabolic panel=CMS Signature(s): Date(s): Prescription 04/25/2019 Patient Name: [name] [name] Provider: [name] MD Date of Birth: 1985-09-13 NPI#: [id number] Sex: M DEA#:  [id number] Phone #: [phone] License #: [id number] Patient Address: [place] [address] [place], [place] [address] [place], [place] [address] [phone] Allergies metformin Provider's Orders Erythrocyte sedimentation rate - ICD10: E11.621 - patient to have lab drawn any outpatient [facility] facility. LOINC Code: [phone] Convenience Name: Sed rate-method unspecified Signature(s): Date(s): Electronic Signature(s) Signed: 04/25/2019 5:54:58 PM By: [name] MD Signed: 04/25/2019 6:20:01 PM By: Deon Pilling Entered By: Deon Pilling on 04/25/2019 17:08:12 --------------------------------------------------------------------------------  Problem List Details Patient Name: Date of Service: [name] [name] 04/25/2019 4:00 PM Medical Record [id number] Patient Account Number: [id number] Date of Birth/Sex: Treating RN: 1986/02/01 (34 y.o. 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 He comes in for review of a wound over the plantar fifth metatarsal head extending into the lateral part of the foot. He was followed for this previously by his podiatrist Dr. Cornelius Moras. As the patient tells his story he went to see podiatry first for a swelling he developed on the lateral part of his fifth metatarsal head in May. He states this was "open" by podiatry and the area closed. He was followed up in June and it was again opened callus removed and it closed promptly. There were plans being made for surgery on the fifth metatarsal head in June however his blood sugar was apparently too high for anesthesia. Apparently the area was debrided and opened again in June and it is never closed since. Looking over the records from podiatry I am really not able to follow this. It was clear when he was first seen it was before 5/14 at that point he already had a wound. By 5/17 the ulcer was resolved. I do not see anything about a procedure. On 5/28 noted to have pre-ulcerative moderate keratosis. X-ray noted 1/5 contracted toe and tailor's bunion and metatarsal deformity. On a visit date on 09/28/2018 the dorsal part of the left foot it healed and resolved. There was concern about swelling in his lower extremity he was sent to the ER.. As far as I can tell he was seen in the ER on 7/12 with an ulcer on his left foot. A DVT rule out of the left leg was negative. I do not think I have complete records from podiatry but I am not able to verify the procedures this patient states he had. He states after the last procedure the wound has never  closed  although I am not able to follow this in the records I have from podiatry. He has not had a recent x-ray The patient has been using Neosporin on the wound. He is wearing a Darco shoe. He is still very active up on his foot working and exercising. Past medical history; type 2 diabetes ketosis-prone, leg swelling with a negative DVT study in July. Non-smoker ABI in our clinic was 0.85 on the left 10/16; substantial wound on the plantar left fifth met head extending laterally almost to the dorsal fifth MTP. We have been using silver alginate we gave him a Darco forefoot off loader. An x-ray did not show evidence of osteomyelitis did note soft tissue emphysema which I think was due to gas tracking through an open wound. There is no doubt in my mind he requires an MRI 10/23; MRI not booked until 3 November at the earliest this is largely due to his glucose sensor in the right arm. We have been using silver alginate. There has been an improvement 10/29; I am still not exactly sure when his MRI is booked for. He says it is the third but it is the 10th in epic. This definitely needs to be done. He is running a low-grade fever today but no other symptoms. No real improvement in the 1 02/26/2019 patient presents today for a follow-up visit here in our clinic he is last been seen in the clinic on October 29. Subsequently we were working on getting MRI to evaluate and see what exactly was going on and where we would need to go from the standpoint of whether or not he had osteomyelitis and again what treatments were going be required. Subsequently the patient ended up being admitted to the hospital on 02/07/2019 and was discharged on 02/14/2019. This is a somewhat interesting admission with a discharge diagnosis of pneumonia due to COVID-19 although he was positive for COVID-19 when tested at the urgent care but negative x2 when he was actually in the hospital. With that being said he did have acute  respiratory failure with hypoxia and it was noted he also have a left foot ulceration with osteomyelitis. With that being said he did require oxygen for his pneumonia and I level 4 L. He was placed on antivirals and steroids for the COVID-19. He was also transferred to the [facility] at one point. Nonetheless he did subsequently discharged home and since being home has done much better in that regard. The CT angiogram did not show any pulmonary embolism. With regard to the osteomyelitis the patient was placed on vancomycin and Zosyn while in the hospital but has been changed to Augmentin at discharge. It was also recommended that he follow-up with wound care and podiatry. Podiatry however wanted him to see [name] according to the patient prior to them doing anything further. His hemoglobin A1c was 9.9 as noted in the hospital. Have an MRI of the left foot performed while in the hospital on 02/04/2019. 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 There is an overlying plantar open wound noted an abscess tracking back along the lateral aspect of the fifth metatarsal shaft. There is otherwise diffuse cellulitis and mild fasciitis without findings of polymyositis. The patient did have recently pneumonia secondary to COVID-19 I looked in the chart through epic and it does appear that the patient may need to have an additional x-ray just to ensure  everything is cleared and that he has no airspace disease prior to putting him into the chamber. 03/05/2019; patient was readmitted to the clinic last week. He was hospitalized twice for a viral upper respiratory tract infection from 11/1 through 11/4 and then 11/5 through 11/12 ultimately this turned out to be Covid pneumonitis. Although he was discharged on oxygen he is not using it. He says he feels fine. He has no exercise limitation no cough no sputum. His O2  sat in our clinic today was 100% on room air. 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 He received Vanco and Zosyn in the hospital and then was discharged on 2 weeks of Augmentin. I do not see any relevant cultures. He was supposed to follow-up with infectious disease but I do not see that he has an appointment. 12/8; patient saw Dr. Novella Olive of infectious disease last week. He felt that he had had adequate antibiotic therapy. He did not go to follow-up with Dr. Amalia Hailey of podiatry and I have again talked to him about the pros and cons of this. He does not want to consider a ray amputation of this time. He is aware of the risks of recurrence, migration etc. He started HBO today and tolerated this well. He can complete the Augmentin that I gave him last week. I have looked over the lab work that Dr. Chana Bode ordered his C-reactive protein was 3.3 and his sedimentation rate was 17. The C-reactive protein is never really been measurably that high in this patient 12/15; not much change in the wound today however he has undermining along the lateral part of the foot again more extensively than last week. He has some rims of epithelialization. We have been using silver alginate. He is undergoing hyperbarics but did not dive today 12/18; in for his obligatory first total contact cast change. Unfortunately there was pus coming from the undermining area around his fifth metatarsal head. This was cultured but will preclude reapplication of a cast. He is seen in conjunction with HBO 12/24; patient had staph lugdunensis in the wound in the undermining area laterally last time. We put him on doxycycline which should have covered this. The wound looks better today. I am going to give him another week of doxycycline before reattempting the total contact cast 12/31; the patient is completing  antibiotics. Hemorrhagic debris in the distal part of the wound with some undermining distally. He also had hyper granulation. Extensive debridement with a #5 curette. The infected area that was on the lateral part of the fifth met head is closed over. I do not think he needs any more antibiotics. Patient was seen prior to HBO. Preparations for a total contact cast were made in the cast will be placed post hyperbarics 04/11/19; once again the patient arrives today without complaint. He had been in a cast all week noted that he had heavy drainage this week. This resulted in large raised areas of macerated tissue around the wound 1/14; wound bed looks better slightly smaller. Hydrofera Blue has been changing himself. He had a heavy drainage last week which caused a lot of maceration around the wound so I took him out of a total contact cast he says the drainage is actually better this week He is seen today in conjunction with HBO 1/21; returns to clinic. He was up in [place] for a day or 2 attending  a funeral. He comes back in with the wound larger and with a large area of exposed bone. He had osteomyelitis and septic arthritis of the fifth left metatarsal head while he was in hospital. He received IV antibiotics in the hospital for a prolonged period of time then 3 weeks of Augmentin. Subsequently I gave him 2 weeks of doxycycline for more superficial wound infection. When I saw this last week the wound was smaller the surface of the wound looks satisfactory. Objective Constitutional Sitting or standing Blood Pressure is within target range for patient.. Pulse regular and within target range for patient.Marland Kitchen Respirations regular, non-labored and within target range.. Temperature is normal and within the target range for the patient.Marland Kitchen Appears in no distress. Vitals Time Taken: 4:14 PM, Height: 77 in, Weight: 260 lbs, BMI: 30.8, Pulse: 100 bpm, Respiratory Rate: 18 breaths/min, Blood Pressure: 123/72  mmHg, Capillary Blood Glucose: 209 mg/dl. General Notes: glucose per pt report Respiratory work of breathing is normal. Bilateral breath sounds are clear and equal in all lobes with no wheezes, rales or rhonchi.. Cardiovascular Heart rhythm and rate regular, without murmur or gallop. JVP is not elevated. Patient has 3-4+ pitting edema of both legs to the knees. No evidence of a DVT. General Notes: Wound exam; he comes in today with undermining towards the medial part of his foot. The wound is larger in the middle of this is exposed bone which is obviously is fifth metatarsal head. I anesthetized the area took a specimen for culture and pathology. I suspect this is residual untreated osteomyelitis. Notable for the fact that he had septic arthritis as well. Integumentary (Hair, Skin) Wound #1 status is Open. Original cause of wound was Trauma. The wound is located on the Left Metatarsal head fifth. The wound measures 2.8cm length x 2.7cm width x 0.6cm depth; 5.938cm^2 area and 3.563cm^3 volume. There is bone and Fat Layer (Subcutaneous Tissue) Exposed exposed. There is no tunneling noted, however, there is undermining starting at 1:00 and ending at 4:00 with a maximum distance of 0.3cm. There is a medium amount of serosanguineous drainage noted. The wound margin is well defined and not attached to the wound base. There is large (67-100%) pink granulation within the wound bed. There is a small (1-33%) amount of necrotic tissue within the wound bed including Adherent Slough. 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 There was a Excisional Skin/Subcutaneous Tissue/Muscle/Bone Debridement with a total area of 2 sq cm performed by [name], MD. With the following instrument(s): Blade, and Forceps to remove Viable and Non-Viable tissue/material. Material removed includes Bone,Callus, Subcutaneous Tissue, Skin: Dermis, and Fibrin/Exudate after achieving pain control using Lidocaine 4% Topical Solution. 2 specimens were taken by a Swab and Tissue Culture and sent to the lab per facility protocol. A time out was conducted at 16:48, prior to the start of the procedure. A Minimum amount of bleeding was controlled with Pressure. The procedure was tolerated well with a pain level of 0 throughout and a pain level of 0 following the procedure. Post Debridement Measurements: 2.8cm length x 2.7cm width x 0.6cm  depth; 3.563cm^3 volume. Character of Wound/Ulcer Post Debridement requires further debridement. Severity of Tissue Post Debridement is: Bone involvement without necrosis. Post procedure Diagnosis Wound #1: Same as Pre-Procedure Plan Follow-up Appointments: Return Appointment in 1 week. - Thursday Dressing Change Frequency: Wound #1 Left Metatarsal head fifth: Change dressing every day. Skin Barriers/Peri-Wound Care: Skin Prep Wound Cleansing: Wound #1 Left Metatarsal head fifth: May shower and wash wound with soap and water. Primary Wound Dressing: Wound #1 Left Metatarsal head fifth: Hydrofera Blue Secondary Dressing: Wound #1 Left Metatarsal head fifth: Kerlix/Rolled Gauze Dry Gauze Other: - felt Off-Loading: Wedge shoe to: - left foot Laboratory ordered were: Biopsy specimen culture - biopsy excisional of bone left foot., Areobic culture-specimen not specified - culture of left foot bone., C Reactive Protein in serum or plasma - patient to have lab drawn any outpatient [facility]., CBC W Reflex Manual Differential panel - patient to have lab drawn any outpatient [place] [facility] facility., Hb A1c -Method not specified - patient to have lab drawn any outpatient [facility]., Comprehensive metabolic panel=CMS - patient to have lab drawn any outpatient [facility]., Sed rate -method unspecified - patient to have lab drawn any outpatient [facility]. 1. Bone obtained for CandS and pathology. If we are going to him make another pass of antibiotics here I would like to have a target before I send him to infectious disease 2. Once again I did talk about a ray amputation. Previously has been refusing this. 3. He missed a whole week of hyperbarics last week. I am not sure if it is worth him restarting today. 4. I am going to await the CandS and pathology and a final discussion with the patient before we consider additional antibiotics or hyperbarics. 5. We will continue to offload in a healing sandal. 6. I am not exactly certain why he has so much pitting edema in his lower legs. No real evidence of a DVT. He does not appear to be systemically fluid volume overloaded. I asked him to follow-up with his primary physician who is Dr. Chriss Czar polite. In preparation for this I did a comprehensive metabolic panel CBC with differential sedimentation rate C- reactive protein and a hemoglobin A1c. I suspect he also needs urine for protein to creatinine ratio. I did not order this Electronic Signature(s) Signed: 04/25/2019 5:54:58 PM By: [name] MD Entered By: [name] on 04/25/2019 17:18:09 -------------------------------------------------------------------------------- SuperBill Details Patient Name: Date of Service: [name] [name] 04/25/2019 Medical Record [id number] Patient Account Number: [id number] Date of Birth/Sex: Treating RN: Nov 17, 1985 (34 y.o. M) Primary Care Provider: [name] Other Clinician: Referring Provider:  Treating Provider/Extender:Shawnette Augello, [name], [name]: 14 Diagnosis Coding ICD-10 Codes Code Description E11.621 Type 2 diabetes mellitus with foot ulcer L97.521 Non-pressure chronic ulcer of other part of left foot limited to breakdown of skin E11.42 Type 2 diabetes mellitus with diabetic polyneuropathy M86.672 Other chronic osteomyelitis, left ankle and foot M00.9 Pyogenic arthritis, unspecified Facility Procedures CPT4 Code Description: [id number] - DEB BONE 20 SQ CM/< ICD-10 Diagnosis Description L97.521 Non-pressure chronic ulcer of other part of left foot limi E11.621 Type 2 diabetes mellitus with foot ulcer Modifier: ted to breakdown Quantity: 1 of skin Physician Procedures CPT4: Code Description Modifier Quantity [id number] Debridement; bone (includes epidermis, dermis, subQ tissue, muscle and/or 1 fascia, if performed) 1st 20 sqcm or less ICD-10 Diagnosis Description L97.521 Non-pressure chronic ulcer of other part of left  foot limited to breakdown of skin  E11.621 Type 2 diabetes mellitus with foot ulcer Electronic Signature(s) Signed: 04/25/2019 5:54:58 PM By: [name] MD Entered By: [name] on 04/25/2019 17:18:20

## 2019-04-26 ENCOUNTER — Other Ambulatory Visit (HOSPITAL_COMMUNITY)
Admission: RE | Admit: 2019-04-26 | Discharge: 2019-04-26 | Disposition: A | Discharge status: Home / Self Care | Payer: BC Managed Care – PPO | Source: Other Acute Inpatient Hospital | Attending: Internal Medicine | Admitting: Internal Medicine

## 2019-04-26 ENCOUNTER — Encounter (HOSPITAL_BASED_OUTPATIENT_CLINIC_OR_DEPARTMENT_OTHER): Payer: BC Managed Care – PPO | Admitting: Internal Medicine

## 2019-04-26 DIAGNOSIS — E11621 Type 2 diabetes mellitus with foot ulcer: Secondary | ICD-10-CM | POA: Insufficient documentation

## 2019-04-29 ENCOUNTER — Encounter (HOSPITAL_BASED_OUTPATIENT_CLINIC_OR_DEPARTMENT_OTHER): Payer: BC Managed Care – PPO | Admitting: Internal Medicine

## 2019-04-29 ENCOUNTER — Other Ambulatory Visit: Payer: Self-pay

## 2019-04-29 DIAGNOSIS — E11621 Type 2 diabetes mellitus with foot ulcer: Secondary | ICD-10-CM | POA: Diagnosis not present

## 2019-04-29 LAB — GLUCOSE, CAPILLARY
Glucose-Capillary: 183 mg/dL — ABNORMAL HIGH (ref 70–99)
Glucose-Capillary: 173 mg/dL — ABNORMAL HIGH (ref 70–99)

## 2019-04-29 NOTE — Progress Notes (Signed)
Prez[place], [name] ([id number]) Visit Report for 04/29/2019 Arrival Information Details Patient Name: Date of Service: [name], [name] 04/29/2019 8:00 AM Medical Record Number:[id number] Patient Account Number: [id number] Date of Birth/Sex: Treating RN: [birth year] (33 y.o. M) Primary Care Citlali Gautney: Katy Apo Other Clinician: [name] Referring Curry Dulski: Treating Torrance Frech/Extender:Robson, Almira Coaster, Chalmers Guest in Treatment: 15 Visit Information History Since Last Visit Added or deleted any medications: No Patient Arrived: Ambulatory Any new allergies or adverse reactions: No Arrival Time: 07:40 Had a fall or experienced change in No Accompanied By: self ` activities of daily living that may affect Transfer Assistance: None risk of falls: Patient Identification Verified: Yes Signs or symptoms of abuse/neglect since last No Secondary Verification Process Yes visito Completed: Hospitalized since last visit: No Patient Requires Transmission-Based No Implantable device outside of the clinic excluding No Precautions: cellular tissue based products placed in the center Patient Has Alerts: No since last visit: Pain Present Now: No Electronic Signature(s) Signed: 04/29/2019 4:40:42 PM By: [name] EMT/HBOT Entered By: [name] on 04/29/2019 10:50:43 -------------------------------------------------------------------------------- Encounter Discharge Information Details Patient Name: Date of Service: [name], [name] 04/29/2019 8:00 AM Medical Record Number:[id number] Patient Account Number: [id number] Date of Birth/Sex: Treating RN: 1985/09/04 (33 y.o. M) Primary Care Izael Bessinger: Katy Apo Other Clinician: [name] Referring Felma Pfefferle: Treating Aashvi Rezabek/Extender:Robson, Almira Coaster, Chalmers Guest in Treatment: 15 Encounter Discharge Information Items Discharge Condition: Stable Ambulatory Status: Ambulatory Discharge Destination:  Home Transportation: Private Auto Accompanied By: self Schedule Follow-up Appointment: Yes Clinical Summary of Care: Patient Declined Electronic Signature(s) Signed: 04/29/2019 4:40:42 PM By: [name] EMT/HBOT Entered By: [name] on 04/29/2019 10:52:54 -------------------------------------------------------------------------------- Patient/Caregiver Education Details Patient Name: Date of Service: [name], [name] 1/25/2021andnbsp8:00 AM Medical Record Number:[id number] Patient Account Number: [id number] Date of Birth/Gender: Treating RN: 09/14/1985 (33 y.o. M) Primary Care Physician: Katy Apo Other Clinician: [name] Referring Physician: Treating Physician/Extender:Robson, Almira Coaster, Chalmers Guest in Treatment: 15 Education Assessment Education Provided To: Patient Education Topics Provided Hyperbaric Oxygenation: Methods: Explain/Verbal Responses: State content correctly Electronic Signature(s) Signed: 04/29/2019 4:40:42 PM By: [name] EMT/HBOT Entered By: [name] on 04/29/2019 10:52:40 -------------------------------------------------------------------------------- Vitals Details Patient Name: Date of Service: [name], [name] 04/29/2019 8:00 AM Medical Record Number:[id number] Patient Account Number: [id number] Date of Birth/Sex: Treating RN: March 09, 1986 (33 y.o. M) Primary Care Casi Westerfeld: Katy Apo Other Clinician: [name] Referring Hamish Banks: Treating Azyiah Bo/Extender:Robson, Almira Coaster, Chalmers Guest in Treatment: 15 Vital Signs Time Taken: 07:45 Temperature (F): 97.8 Height (in): 77 Pulse (bpm): 100 Weight (lbs): 260 Respiratory Rate (breaths/min): 17 Body Mass Index (BMI): 30.8 Blood Pressure (mmHg): 116/76 Capillary Blood Glucose (mg/dl): 814 Reference Range: 80 - 120 mg / dl Electronic Signature(s) Signed: 04/29/2019 4:40:42 PM By: [name] EMT/HBOT Entered By: [name] on  04/29/2019 10:50:51

## 2019-04-29 NOTE — Progress Notes (Addendum)
Mosher, [name] ([id number]) Visit Report for 04/25/2019 Arrival Information Details Patient Name: Date of Service: [name], [name] 04/25/2019 4:00 PM Medical Record Number:[id number] Patient Account Number: [id number] Date of Birth/Sex: Treating RN: 1986-01-12 (34 y.o. [name] Primary Care Shahin Knierim: Katy Apo Other Clinician: Referring Larnell Granlund: Treating Tomiko Schoon/Extender:Robson, Almira Coaster, Chalmers Guest in Treatment: 14 Visit Information History Since Last Visit Added or deleted any No Patient Arrived: Ambulatory medications: Arrival Time: 16:10 Any new allergies or adverse No Accompanied By: alone reactions: Transfer Assistance: None Had a fall or experienced change No Patient Identification Verified: Yes in Secondary Verification Process Yes activities of daily living that may Completed: affect Patient Requires Transmission-Based No risk of falls: Precautions: Signs or symptoms of No Patient Has Alerts: No abuse/neglect since last visito Hospitalized since last visit: No Implantable device outside of the No clinic excluding cellular tissue based products placed in the center since last visit: Has Dressing in Place as Yes Prescribed: Has Compression in Place as Yes Prescribed: Has Footwear/Offloading in Place Yes as Prescribed: Left: Surgical Shoe with Pressure Relief Insole Pain Present Now: No Electronic Signature(s) Signed: 04/29/2019 6:46:58 PM By: Zandra Abts RN, BSN Entered By: Zandra Abts on 04/25/2019 16:12:14 -------------------------------------------------------------------------------- Lower Extremity Assessment Details Patient Name: Date of Service: [name], [name] 04/25/2019 4:00 PM Medical Record Number:[id number] Patient Account Number: [id number] Date of Birth/Sex: Treating RN: April 24, 1985 (34 y.o. [name] Primary Care Caitriona Sundquist: Katy Apo Other Clinician: Referring Shadrack Brummitt: Treating  Candiss Galeana/Extender:Robson, Almira Coaster, Deirdre Peer Weeks in Treatment: 14 Edema Assessment Assessed: [Left: No] [Right: No] Edema: [Left: Ye] [Right: s] Calf Left: Right: Point of Measurement: 48 cm From Medial Instep 45 cm cm Ankle Left: Right: Point of Measurement: 12 cm From Medial Instep 33 cm cm Vascular Assessment Pulses: Dorsalis Pedis Palpable: [Left:Yes] Electronic Signature(s) Signed: 04/29/2019 6:46:58 PM By: Zandra Abts RN, BSN Entered By: Zandra Abts on 04/25/2019 16:14:52 -------------------------------------------------------------------------------- Multi Wound Chart Details Patient Name: Date of Service: [name], [name] 04/25/2019 4:00 PM Medical Record Number:[id number] Patient Account Number: [id number] Date of Birth/Sex: Treating RN: 02-28-1986 (34 y.o. M) Primary Care Simpson Paulos: Katy Apo Other Clinician: Referring Marquan Vokes: Treating Craig Ionescu/Extender:Robson, Almira Coaster, Chalmers Guest in Treatment: 14 Vital Signs Height(in): 77 Capillary Blood 209 Glucose(mg/dl): Weight(lbs): 202 Pulse(bpm): 100 Body Mass Index(BMI): 31 Blood Pressure(mmHg): 123/72 Temperature(F): Respiratory 18 Rate(breaths/min): Photos: [1:No Photos] [N/A:N/A] Wound Location: [1:Left Metatarsal head fifth] [N/A:N/A] Wounding Event: [1:Trauma] [N/A:N/A] Primary Etiology: [1:Diabetic Wound/Ulcer of the N/A Lower Extremity] [N/A:N/A] Comorbid History: [1:Type II Diabetes] [N/A:N/A N/A] Date Acquired: [1:08/03/2018] [N/A:N/A N/A] Weeks of Treatment: [1:14] [N/A:N/A N/A] Wound Status: [1:Open] [N/A:N/A N/A] Measurements L x W x D 2.8x2.7x0.6 [N/A:N/A N/A] (cm) Area (cm) : [1:5.938] [N/A:N/A N/A] Volume (cm) : [1:3.563] [N/A:N/A N/A] % Reduction in Area: [1:-102.20%] [N/A:N/A N/A] % Reduction in Volume: -51.60% [N/A:N/A N/A] Starting Position 1 [1:1] (o'clock): Ending Position 1 [1:4] (o'clock): Maximum Distance 1 [1:0.3] (cm): Undermining: [1:Yes]  [N/A:N/A N/A] Classification: [1:Grade 2] [N/A:N/A N/A] Exudate Amount: [1:Medium] [N/A:N/A N/A] Exudate Type: [1:Serosanguineous] [N/A:N/A N/A] Exudate Color: [1:red, brown] [N/A:N/A N/A] Wound Margin: [1:Well defined, not attached N/A] [N/A:N/A] Granulation Amount: [1:Large (67-100%)] [N/A:N/A N/A] Granulation Quality: [1:Pink] [N/A:N/A N/A] Necrotic Amount: [1:Small (1-33%)] [N/A:N/A N/A] Exposed Structures: [1:Fat Layer (Subcutaneous N/A Tissue) Exposed: Yes Bone: Yes Fascia: No Tendon: No Muscle: No Joint: No] [N/A:N/A] Epithelialization: [1:Small (1-33%)] [N/A:N/A N/A] Debridement: [1:Debridement - Excisional N/A] [N/A:N/A] Pre-procedure [1:16:48] [N/A:N/A N/A] Verification/Time Out Taken: Pain Control: [1:Lidocaine 4% Topical Solution] [N/A:N/A N/A] Tissue Debrided: [1:Callus,  Subcutaneous] [N/A:N/A N/A] Level: [1:Skin/Subcutaneous Tissue N/A] [N/A:N/A] Debridement Area (sq cm):2 [N/A:N/A N/A] Instrument: [1:Blade, Forceps] [N/A:N/A N/A] Specimen: [1:Swab] [N/A:N/A N/A] Number of Specimens [1:2] [N/A:N/A N/A] Taken: Bleeding: [1:Minimum] [N/A:N/A N/A] Hemostasis Achieved: [1:Pressure] [N/A:N/A N/A] Procedural Pain: [1:0] [N/A:N/A N/A] Post Procedural Pain: [1:0] [N/A:N/A N/A] Debridement Treatment Procedure was tolerated [N/A:N/A N/A] Response: [1:well] Post Debridement [1:2.8x2.7x0.6] [N/A:N/A N/A] Measurements L x W x D (cm) Post Debridement [1:3.563] [N/A:N/A N/A] Volume: (cm) Procedures Performed: Debridement [N/A:N/A N/A] Electronic Signature(s) Signed: 04/25/2019 5:54:58 PM By: Baltazar Najjar MD Entered By: Baltazar Najjar on 04/25/2019 17:11:11 -------------------------------------------------------------------------------- Multi-Disciplinary Care Plan Details Patient Name: Date of Service: [name], [name] 04/25/2019 4:00 PM Medical Record Number:[id number] Patient Account Number: [id number] Date of Birth/Sex: Treating RN: 04/14/1985 (34 y.o. [name] Primary Care Khyan Oats: Katy Apo Other Clinician: Referring Malakie Balis: Treating Anahis Furgeson/Extender:Robson, Almira Coaster, Chalmers Guest in Treatment: 14 Active Inactive Wound/Skin Impairment Nursing Diagnoses: Impaired tissue integrity Knowledge deficit related to ulceration/compromised skin integrity Goals: Patient/caregiver will verbalize understanding of skin care regimen Date Initiated: 01/11/2019 Target Resolution Date: 05/25/2019 Goal Status: Active Ulcer/skin breakdown will have a volume reduction of 30% by week 4 Date Initiated: 01/11/2019 Date Inactivated: 02/26/2019 Target Resolution Date: 02/08/2019 Goal Status: Unmet Unmet Reason: comorbities Ulcer/skin breakdown will have a volume reduction of 50% by week 8 Date Initiated: 02/26/2019 Date Inactivated: 03/28/2019 Target Resolution Date: 04/12/2019 Unmet Reason: no change Goal Status: Unmet in wound measurement. see wound measurment. Interventions: Assess patient/caregiver ability to obtain necessary supplies Assess patient/caregiver ability to perform ulcer/skin care regimen upon admission and as needed Assess ulceration(s) every visit Provide education on ulcer and skin care Notes: Electronic Signature(s) Signed: 04/25/2019 6:20:01 PM By: Shawn Stall Entered By: Shawn Stall on 04/25/2019 16:14:46 -------------------------------------------------------------------------------- Pain Assessment Details Patient Name: Date of Service: [name], [name] 04/25/2019 4:00 PM Medical Record Number:[id number] Patient Account Number: [id number] Date of Birth/Sex: Treating RN: 1985/05/28 (34 y.o. [name] Primary Care Sahily Biddle: Katy Apo Other Clinician: Referring Kaelea Gathright: Treating Araiya Tilmon/Extender:Robson, Almira Coaster, Chalmers Guest in Treatment: 14 Active Problems Location of Pain Severity and Description of Pain Patient Has Paino No Site Locations Pain Management and Medication Current  Pain Management: Electronic Signature(s) Signed: 04/29/2019 6:46:58 PM By: Zandra Abts RN, BSN Entered By: Zandra Abts on 04/25/2019 16:12:28 -------------------------------------------------------------------------------- Patient/Caregiver Education Details Patient Name: Date of Service: [name], [name] 1/21/2021andnbsp4:00 PM Medical Record Number:[id number] Patient Account Number: [id number] Date of Birth/Gender: Treating RN: June 08, 1985 (33 y.o. [name] Primary Care Physician: Katy Apo Other Clinician: Referring Physician: Treating Physician/Extender:Robson, Almira Coaster, Chalmers Guest in Treatment: 14 Education Assessment Education Provided To: Patient Education Topics Provided Wound/Skin Impairment: Handouts: Skin Care Do's and Dont's Methods: Explain/Verbal Responses: Reinforcements needed Electronic Signature(s) Signed: 04/25/2019 6:20:01 PM By: Shawn Stall Entered By: Shawn Stall on 04/25/2019 16:14:57 -------------------------------------------------------------------------------- Wound Assessment Details Patient Name: Date of Service: [name], [name] 04/25/2019 4:00 PM Medical Record Number:[id number] Patient Account Number: [id number] Date of Birth/Sex: Treating RN: December 27, 1985 (34 y.o. [name] Primary Care Terran Klinke: Katy Apo Other Clinician: Referring Jadon Harbaugh: Treating Ramondo Dietze/Extender:Robson, Almira Coaster, Chalmers Guest in Treatment: 14 Wound Status Wound Number: 1 Primary Diabetic Wound/Ulcer of the Lower Etiology: Extremity Wound Location: Left Metatarsal head fifth Wound Status: Open Wounding Event: Trauma Comorbid Type II Diabetes Date Acquired: 08/03/2018 History: Weeks Of Treatment: 14 Clustered Wound: No Photos Wound Measurements Length: (cm) 2.8 % Reducti Width: (cm) 2.7 % Reducti Depth: (cm) 0.6 Epithelia Area: (cm) 5.938 Tunnelin Volume: (cm) 3.563  Undermin Starti Ending Maximu Wound  Description Classification: Grade 3 Foul Odor Wound Margin: Well defined, not attached Slough/Fib Exudate Amount: Medium Exudate Type: Serosanguineous Exudate Color: red, brown Wound Bed Granulation Amount: Large (67-100%) Granulation Quality: Pink Fascia Expo Necrotic Amount: Small (1-33%) Fat Layer ( Necrotic Quality: Adherent Slough Tendon Expo Muscle Expo Joint Expos Bone Expose After Cleansing: No rino Yes Exposed Structure sed: No Subcutaneous Tissue) Exposed: Yes sed: No sed: No ed: No d: Yes on in Area: -102.2% on in Volume: -51.6% lization: Small (1-33%) g: No ing: Yes ng Position (o'clock): 1 Position (o'clock): 4 m Distance: (cm) 0.3 Electronic Signature(s) Signed: 05/30/2019 2:24:55 PM By: [name] RN, BSN Previous Signature: 04/26/2019 5:14:05 PM Version By: [name] EMT/HBOT Previous Signature: 04/29/2019 6:46:58 PM Version By: [name] RN, BSN Previous Signature: 04/25/2019 6:20:01 PM Version By: Deon Pilling Entered By: [name] on 05/02/2019 16:37:07 -------------------------------------------------------------------------------- Vitals Details Patient Name: Date of Service: [name], [name] 04/25/2019 4:00 PM Medical Record [id number] Patient Account Number: [id number] Date of Birth/Sex: Treating RN: 1985-11-05 (34 y.o. [name] Primary Care Trissa Molina: [name] Other Clinician: Referring Rexann Lueras: Treating Aymen Widrig/Extender:Robson, [name], [name]: 14 Vital Signs Time Taken: 16:14 Pulse (bpm): 100 Height (in): 77 Respiratory Rate (breaths/min): 18 Weight (lbs): 260 Blood Pressure (mmHg): 123/72 Body Mass Index (BMI): 30.8 Capillary Blood Glucose (mg/dl): 209 Reference Range: 80 - 120 mg / dl Notes glucose per pt report Electronic Signature(s) Signed: 04/29/2019 6:46:58 PM By: [name] RN, BSN Entered By: [name] on 04/25/2019 16:14:24

## 2019-04-29 NOTE — Progress Notes (Addendum)
[name], [name] ([id number]) Visit Report for 04/29/2019 HBO Details Patient Name: Date of Service: [name], [name] 04/29/2019 8:00 AM Medical Record [id number] Patient Account Number: [id number] Date of Birth/Sex: Treating RN: 09/26/1985 (34 y.o. M) Primary Care Kenderick Kobler: [name] Other Clinician: [name] Referring Foy Mungia: Treating Casia Corti/Extender:Robson, [name], [name]: 15 HBO Treatment Course Details Treatment Course Number: 1 Ordering Starling Christofferson: [name] Total Treatments Ordered: 40 HBO Treatment Start Date: 03/12/2019 HBO Indication: Diabetic Ulcer(s) of the Lower Extremity HBO Treatment Details Treatment Number: 24 Patient Type: Outpatient Chamber Type: Monoplace Chamber Serial #: [id number] Treatment Protocol: 2.0 ATA with 90 minutes oxygen, and no air breaks Treatment Details Compression Rate Down: 2.0 psi / minute De-Compression Rate Up: 3.0 psi / minute Air breaks and CompressTx Pressure breathing DecompressDecompress Begins Reached periods Begins Ends (leave unused spaces blank) Chamber Pressure (ATA)1 2 ------2 1 Clock Time (24 hr) 07:50 07:58 - - - - - - 09:28 09:36 Treatment Length: 106 (minutes) Treatment Segments: 4 Vital Signs Capillary Blood Glucose Reference Range: 80 - 120 mg / dl HBO Diabetic Blood Glucose Intervention Range: <131 mg/dl or >249 mg/dl Time Vitals Blood Respiratory Capillary Blood Glucose Pulse Action Type: Pulse: Temperature: Taken: Pressure: Rate: Glucose (mg/dl): Meter #: Oximetry (%) Taken: Pre 07:45 116/76 100 17 97.8 183 Post 09:37 163/98 83 14 98 173 Treatment Response Treatment Toleration: Well Treatment Completion Treatment Completed without Adverse Event Status: Additional Procedure Documentation Tissue Sevierity: Necrosis of bone Jeromey Kruer Notes no concerns with treatment given Physician HBO Attestation: I certify that I supervised this HBO treatment  in accordance with Medicare guidelines. A trained Yes emergency response team is readily available per hospital policies and procedures. Continue HBOT as ordered. Yes Electronic Signature(s) Signed: 04/30/2019 5:32:11 PM By: [name] MD Previous Signature: 04/29/2019 4:40:42 PM Version By: [name] EMT/HBOT Entered By: [name] on 04/29/2019 19:12:41 -------------------------------------------------------------------------------- HBO Safety Checklist Details Patient Name: Date of Service: [name], [name] 04/29/2019 8:00 AM Medical Record [id number] Patient Account Number: [id number] Date of Birth/Sex: Treating RN: 1985/11/24 (34 y.o. M) Primary Care Jakeria Caissie: [name] Other Clinician: [name] Referring Leeasia Secrist: Treating Avery Eustice/Extender:Robson, [name], [name]: 15 HBO Safety Checklist Items Safety Checklist Consent Form Signed Patient voided / foley secured and emptied When did you last eato 0700 - banana Last dose of injectable or oral agent insulin NA Ostomy pouch emptied and vented if applicable All implantable devices assessed, documented and approved NA Intravenous access site secured and place Valuables secured Linens and cotton and cotton/polyester blend (less than 51% polyester) Personal oil-based products / skin lotions / body lotions removed NA Wigs or hairpieces removed NA Smoking or tobacco materials removed Books / newspapers / magazines / loose paper removed Cologne, aftershave, perfume and deodorant removed Jewelry removed (may wrap wedding band) NA Make-up removed Hair care products removed NA Battery operated devices (external) removed NA Heating patches and chemical warmers removed NA Titanium eyewear removed NA Nail polish cured greater than 10 hours NA Casting material cured greater than 10 hours NA Hearing aids removed NA Loose dentures or partials removed NA Prosthetics have been  removed Patient demonstrates correct use of air break device (if applicable) Patient concerns have been addressed Patient grounding bracelet on and cord attached to chamber Specifics for Inpatients (complete in addition to above) Medication sheet sent with patient Intravenous medications needed or due during therapy sent with patient Drainage tubes (e.g. nasogastric tube or chest tube secured and vented) Endotracheal  or Tracheotomy tube secured Cuff deflated of air and inflated with saline Airway suctioned Electronic Signature(s) Signed: 04/29/2019 9:01:30 AM By: [name] EMT/HBOT Entered By: [name] on 04/29/2019 09:01:29

## 2019-04-30 ENCOUNTER — Encounter (HOSPITAL_BASED_OUTPATIENT_CLINIC_OR_DEPARTMENT_OTHER): Payer: BC Managed Care – PPO | Admitting: Internal Medicine

## 2019-04-30 DIAGNOSIS — E11621 Type 2 diabetes mellitus with foot ulcer: Secondary | ICD-10-CM | POA: Diagnosis not present

## 2019-04-30 LAB — GLUCOSE, CAPILLARY
Glucose-Capillary: 148 mg/dL — ABNORMAL HIGH (ref 70–99)
Glucose-Capillary: 138 mg/dL — ABNORMAL HIGH (ref 70–99)

## 2019-04-30 NOTE — Progress Notes (Signed)
Lacour, [name] ([id number]) Visit Report for 04/29/2019 SuperBill Details Patient Name: Date of Service: [name], [name] 04/29/2019 Medical Record Number:[id number] Patient Account Number: [id number] Date of Birth/Sex: Treating RN: March 13, 1986 (34 y.o. 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

## 2019-04-30 NOTE — Progress Notes (Addendum)
Luckey, [name] ([id number]) Visit Report for 04/30/2019 HBO Details Patient Name: Date of Service: [name], [name] 04/30/2019 8:00 AM Medical Record [id number] Patient Account Number: [id number] Date of Birth/Sex: Treating RN: 1985/10/01 (34 y.o. M) Primary Care Der Gagliano: [name] Other Clinician: [name] Referring Noble Cicalese: Treating Verena Shawgo/Extender:Robson, [name], [name]: 15 HBO Treatment Course Details Treatment Course Number: 1 Ordering Cylie Dor: [name] Total Treatments Ordered: 40 HBO Treatment Start Date: 03/12/2019 HBO Indication: Diabetic Ulcer(s) of the Lower Extremity HBO Treatment Details Treatment Number: 25 Patient Type: Outpatient Chamber Type: Monoplace Chamber Serial #: [id number] Treatment Protocol: 2.0 ATA with 90 minutes oxygen, and no air breaks Treatment Details Compression Rate Down: 2.0 psi / minute De-Compression Rate Up: 2.0 psi / minute Air breaks and CompressTx Pressure breathing DecompressDecompress Begins Reached periods Begins Ends (leave unused spaces blank) Chamber Pressure (ATA)1 2 ------2 1 Clock Time (24 hr) 07:55 08:03 - - - - - - 09:33 09:41 Treatment Length: 106 (minutes) Treatment Segments: 4 Vital Signs Capillary Blood Glucose Reference Range: 80 - 120 mg / dl HBO Diabetic Blood Glucose Intervention Range: <131 mg/dl or >249 mg/dl Time Vitals Blood Respiratory Capillary Blood Glucose Pulse Action Type: Pulse: Temperature: Taken: Pressure: Rate: Glucose (mg/dl): Meter #: Oximetry (%) Taken: Pre 07:45 104/58 106 15 98.4 148 Post 09:42 129/66 85 16 98.1 138 Treatment Response Treatment Toleration: Well Treatment Completion Treatment Completed without Adverse Event Status: Additional Procedure Documentation Tissue Sevierity: Necrosis of bone Tamikka Pilger Notes No concerns with treatment given Physician HBO Attestation: I certify that I supervised this HBO treatment  in accordance with Medicare guidelines. A trained Yes emergency response team is readily available per hospital policies and procedures. Continue HBOT as ordered. Yes Electronic Signature(s) Signed: 04/30/2019 5:32:11 PM By: [name] MD Previous Signature: 04/30/2019 5:09:30 PM Version By: [name] EMT/HBOT Entered By: [name] on 04/30/2019 17:16:17 -------------------------------------------------------------------------------- HBO Safety Checklist Details Patient Name: Date of Service: Mondry, [name] 04/30/2019 8:00 AM Medical Record [id number] Patient Account Number: [id number] Date of Birth/Sex: Treating RN: 05-20-1985 (34 y.o. M) Primary Care Darrelle Wiberg: [name] Other Clinician: [name] Referring Roopa Graver: Treating Asal Teas/Extender:Robson, [name], [name]: 15 HBO Safety Checklist Items Safety Checklist Consent Form Signed Patient voided / foley secured and emptied When did you last eato 0700 - peanuts / shake Last dose of injectable or oral agent insulin NA Ostomy pouch emptied and vented if applicable All implantable devices assessed, documented and approved NA Intravenous access site secured and place Valuables secured Linens and cotton and cotton/polyester blend (less than 51% polyester) Personal oil-based products / skin lotions / body lotions removed NA Wigs or hairpieces removed NA Smoking or tobacco materials removed Books / newspapers / magazines / loose paper removed Cologne, aftershave, perfume and deodorant removed Jewelry removed (may wrap wedding band) NA Make-up removed Hair care products removed NA Battery operated devices (external) removed NA Heating patches and chemical warmers removed NA Titanium eyewear removed NA Nail polish cured greater than 10 hours NA Casting material cured greater than 10 hours NA Hearing aids removed NA Loose dentures or partials removed NA Prosthetics  have been removed Patient demonstrates correct use of air break device (if applicable) Patient concerns have been addressed Patient grounding bracelet on and cord attached to chamber Specifics for Inpatients (complete in addition to above) Medication sheet sent with patient Intravenous medications needed or due during therapy sent with patient Drainage tubes (e.g. nasogastric tube or chest tube secured and  vented) Endotracheal or Tracheotomy tube secured Cuff deflated of air and inflated with saline Airway suctioned Electronic Signature(s) Signed: 04/30/2019 8:16:06 AM By: [name] EMT/HBOT Entered By: [name] on 04/30/2019 08:16:06

## 2019-04-30 NOTE — Progress Notes (Signed)
[place], [name] ([id number]) Visit Report for 04/30/2019 SuperBill Details Patient Name: Date of Service: [place], [name] 04/30/2019 Medical Record Number:[id number] Patient Account Number: [id number] Date of Birth/Sex: Treating RN: 1986/03/20 (34 y.o. 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

## 2019-04-30 NOTE — Progress Notes (Signed)
Driggers, [name] ([id number]) Visit Report for 04/30/2019 Arrival Information Details Patient Name: Date of Service: [name], [name] 04/30/2019 8:00 AM Medical Record Number:[id number] Patient Account Number: [id number] Date of Birth/Sex: Treating RN: 27-Oct-1985 (33 y.o. M) Primary Care Da Michelle: Katy Apo Other Clinician: [name] Referring Abe Schools: Treating Aroldo Galli/Extender:Robson, Almira Coaster, Chalmers Guest in Treatment: 15 Visit Information History Since Last Visit Added or deleted any medications: No Patient Arrived: Ambulatory Any new allergies or adverse reactions: No Arrival Time: 07:40 Had a fall or experienced change in No Accompanied By: self activities of daily living that may affect Transfer Assistance: None risk of falls: Patient Identification Verified: Yes Signs or symptoms of abuse/neglect since last No Secondary Verification Process Yes visito Completed: Hospitalized since last visit: No Patient Requires Transmission-Based No Implantable device outside of the clinic excluding No Precautions: cellular tissue based products placed in the center Patient Has Alerts: No since last visit: Pain Present Now: No Electronic Signature(s) Signed: 04/30/2019 5:09:30 PM By: [name] EMT/HBOT Entered By: [name] on 04/30/2019 08:15:03 -------------------------------------------------------------------------------- Encounter Discharge Information Details Patient Name: Date of Service: [name], [name] 04/30/2019 8:00 AM Medical Record Number:[id number] Patient Account Number: [id number] Date of Birth/Sex: Treating RN: 1985/12/29 (33 y.o. M) Primary Care Jaivian Battaglini: Katy Apo Other Clinician: [name] Referring Abhishek Levesque: Treating Keontre Defino/Extender:Robson, Almira Coaster, Chalmers Guest in Treatment: 15 Encounter Discharge Information Items Discharge Condition: Stable Ambulatory Status: Ambulatory Discharge Destination:  Home Transportation: Private Auto Accompanied By: self Schedule Follow-up Appointment: Yes Clinical Summary of Care: Patient Declined Electronic Signature(s) Signed: 04/30/2019 5:09:30 PM By: [name] EMT/HBOT Entered By: [name] on 04/30/2019 10:25:04 -------------------------------------------------------------------------------- Patient/Caregiver Education Details Patient Name: Date of Service: [name], [name] 1/26/2021andnbsp8:00 AM Medical Record Number:[id number] Patient Account Number: [id number] Date of Birth/Gender: Treating RN: April 03, 1986 (33 y.o. M) Primary Care Physician: Katy Apo Other Clinician: [name] Referring Physician: Treating Physician/Extender:Robson, Almira Coaster, Chalmers Guest in Treatment: 15 Education Assessment Education Provided To: Patient Education Topics Provided Hyperbaric Oxygenation: Methods: Explain/Verbal Responses: State content correctly Electronic Signature(s) Signed: 04/30/2019 5:09:30 PM By: [name] EMT/HBOT Entered By: [name] on 04/30/2019 10:24:54 -------------------------------------------------------------------------------- Vitals Details Patient Name: Date of Service: [name], [name] 04/30/2019 8:00 AM Medical Record Number:[id number] Patient Account Number: [id number] Date of Birth/Sex: Treating RN: [birth year] (33 y.o. M) Primary Care Eve Rey: Katy Apo Other Clinician: [name] Referring Kiree Dejarnette: Treating Maeva Dant/Extender:Robson, Almira Coaster, Chalmers Guest in Treatment: 15 Vital Signs Time Taken: 07:45 Temperature (F): 98.4 Height (in): 77 Pulse (bpm): 106 Weight (lbs): 260 Respiratory Rate (breaths/min): 15 Body Mass Index (BMI): 30.8 Blood Pressure (mmHg): 104/58 Capillary Blood Glucose (mg/dl): 786 Reference Range: 80 - 120 mg / dl Electronic Signature(s) Signed: 04/30/2019 5:09:30 PM By: [name] EMT/HBOT Entered By: [name] on  04/30/2019 08:15:21

## 2019-05-01 ENCOUNTER — Other Ambulatory Visit: Payer: Self-pay

## 2019-05-01 ENCOUNTER — Encounter (HOSPITAL_BASED_OUTPATIENT_CLINIC_OR_DEPARTMENT_OTHER): Payer: BC Managed Care – PPO | Admitting: Physician Assistant

## 2019-05-01 DIAGNOSIS — E11621 Type 2 diabetes mellitus with foot ulcer: Secondary | ICD-10-CM | POA: Diagnosis not present

## 2019-05-01 LAB — GLUCOSE, CAPILLARY
Glucose-Capillary: 197 mg/dL — ABNORMAL HIGH (ref 70–99)
Glucose-Capillary: 180 mg/dL — ABNORMAL HIGH (ref 70–99)

## 2019-05-01 NOTE — Progress Notes (Signed)
[name], [name] ([id number]) Visit Report for 05/01/2019 Arrival Information Details Patient Name: Date of Service: [name], [name] 05/01/2019 8:00 AM Medical Record Number:[id number] Patient Account Number: [id number] Date of Birth/Sex: Treating RN: Jan 15, 1986 (33 y.o. M) Primary Care Raeanne Deschler: Katy Apo Other Clinician: [name] Referring Cheralyn Oliver: Treating Delaynee Alred/Extender:Stone III, Aleda Grana, Chalmers Guest in Treatment: 15 Visit Information History Since Last Visit Added or deleted any medications: No Patient Arrived: Ambulatory Any new allergies or adverse reactions: No Arrival Time: 07:50 Had a fall or experienced change in No Accompanied By: self activities of daily living that may affect Transfer Assistance: None risk of falls: Patient Identification Verified: Yes Signs or symptoms of abuse/neglect since last No Secondary Verification Process Yes visito Completed: Hospitalized since last visit: No Patient Requires Transmission-Based No Implantable device outside of the clinic excluding No Precautions: cellular tissue based products placed in the center Patient Has Alerts: No since last visit: Pain Present Now: No Electronic Signature(s) Signed: 05/01/2019 4:33:34 PM By: [name] EMT/HBOT Entered By: [name] on 05/01/2019 08:28:58 -------------------------------------------------------------------------------- Encounter Discharge Information Details Patient Name: Date of Service: [name], [name] 05/01/2019 8:00 AM Medical Record Number:[id number] Patient Account Number: [id number] Date of Birth/Sex: Treating RN: 28-Nov-1985 (33 y.o. M) Primary Care Javeria Briski: Katy Apo Other Clinician: [name] Referring Winfield Caba: Treating Annelisa Ryback/Extender:Stone III, Aleda Grana, Chalmers Guest in Treatment: 15 Encounter Discharge Information Items Discharge Condition: Stable Ambulatory Status: Ambulatory Discharge Destination:  Home Transportation: Private Auto Accompanied By: self Schedule Follow-up Appointment: Yes Clinical Summary of Care: Patient Declined Electronic Signature(s) Signed: 05/01/2019 4:33:34 PM By: [name] EMT/HBOT Entered By: [name] on 05/01/2019 10:51:11 -------------------------------------------------------------------------------- Patient/Caregiver Education Details Patient Name: Date of Service: [name], [name] 1/27/2021andnbsp8:00 AM Medical Record Number:[id number] Patient Account Number: [id number] Date of Birth/Gender: Treating RN: 18-Nov-1985 (33 y.o. M) Primary Care Physician: Katy Apo Other Clinician: [name] Referring Physician: Treating Physician/Extender:Stone III, Aleda Grana, Chalmers Guest in Treatment: 15 Education Assessment Education Provided To: Patient Education Topics Provided Hyperbaric Oxygenation: Methods: Explain/Verbal Responses: State content correctly Electronic Signature(s) Signed: 05/01/2019 4:33:34 PM By: [name] EMT/HBOT Entered By: [name] on 05/01/2019 10:50:56 -------------------------------------------------------------------------------- Vitals Details Patient Name: Date of Service: [name], [name] 05/01/2019 8:00 AM Medical Record Number:[id number] Patient Account Number: [id number] Date of Birth/Sex: Treating RN: 1985-08-16 (33 y.o. M) Primary Care Matthew Pais: Katy Apo Other Clinician: [name] Referring Zayden Hahne: Treating Terrel Manalo/Extender:Stone III, Aleda Grana, Deirdre Peer Weeks in Treatment: 15 Vital Signs Time Taken: 07:55 Temperature (F): 98.6 Height (in): 77 Pulse (bpm): 103 Weight (lbs): 260 Respiratory Rate (breaths/min): 16 Body Mass Index (BMI): 30.8 Blood Pressure (mmHg): 129/71 Capillary Blood Glucose (mg/dl): 366 Reference Range: 80 - 120 mg / dl Electronic Signature(s) Signed: 05/01/2019 4:33:34 PM By: [name] EMT/HBOT Entered By: [name] on  05/01/2019 08:29:14

## 2019-05-01 NOTE — Progress Notes (Signed)
[place], [name] ([id number]) Visit Report for 05/01/2019 Problem List Details Patient Name: Date of Service: [place], [name] 05/01/2019 8:00 AM Medical Record Number:[id number] Patient Account Number: [id number] Date of Birth/Sex: Treating RN: [birth year] (33 y.o. 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

## 2019-05-01 NOTE — Progress Notes (Addendum)
[name], [name] ([id number]) Visit Report for 05/01/2019 HBO Details Patient Name: Date of Service: [name], [name] 05/01/2019 8:00 AM Medical Record [id number] Patient Account Number: [id number] Date of Birth/Sex: Treating RN: 08/15/1985 (34 y.o. M) Primary Care Jonisha Kindig: [name] Other Clinician: [name] Referring Abbeygail Igoe: Treating Charlea Nardo/Extender:Stone III, Newell Coral, [name]: 15 HBO Treatment Course Details Treatment Course Number: 1 Ordering Cyntia Staley: [name] Total Treatments Ordered: 40 HBO Treatment 03/12/2019 Start Date: HBO Indication: Diabetic Ulcer(s) of the Lower Extremity HBO Treatment 05/01/2019 End Date: HBO Discharge Treatment Series Incomplete; Wound Outcome: Progress Plateaued HBO Treatment Details Treatment Number: 26 Patient Type: Outpatient Chamber Type: Monoplace Chamber Serial #: [id number] Treatment Protocol: 2.0 ATA with 90 minutes oxygen, and no air breaks Treatment Details Compression Rate Down: 2.0 psi / minute De-Compression Rate Up: 2.0 psi / minute Air breaks and CompressTx Pressure breathing DecompressDecompress Begins Reached periods Begins Ends (leave unused spaces blank) Chamber Pressure (ATA)1 2 ------2 1 Clock Time (24 hr) 08:10 08:18 - - - - - - 09:48 09:56 Treatment Length: 106 (minutes) Treatment Segments: 4 Vital Signs Capillary Blood Glucose Reference Range: 80 - 120 mg / dl HBO Diabetic Blood Glucose Intervention Range: <131 mg/dl or >249 mg/dl Time Vitals Blood Respiratory Capillary Blood Glucose Pulse Action Type: Pulse: Temperature: Taken: Pressure: Rate: Glucose (mg/dl): Meter #: Oximetry (%) Taken: Pre 07:55 129/71 103 16 98.6 197 Post 09:58 151/93 85 16 98.2 180 Treatment Response Treatment Toleration: Well Adverse Events: 1:Other Treatment Completion Treatment Completed without Adverse Event Status: Additional Procedure Documentation Tissue Sevierity:  Necrosis of bone Physician HBO Attestation: I certify that I supervised this HBO treatment in accordance with Medicare guidelines. A trained Yes emergency response team is readily available per hospital policies and procedures. Continue HBOT as ordered. Yes Electronic Signature(s) Signed: 05/02/2019 5:50:31 PM By: [name] EMT/HBOT Signed: 05/23/2019 12:37:44 PM By: Worthy Keeler PA-C Previous Signature: 05/01/2019 5:26:25 PM Version By: Worthy Keeler PA-C Previous Signature: 05/01/2019 4:33:34 PM Version By: [name] EMT/HBOT Entered By: [name] on 05/02/2019 17:50:30 -------------------------------------------------------------------------------- HBO Safety Checklist Details Patient Name: Date of Service: [name], [name] 05/01/2019 8:00 AM Medical Record [id number] Patient Account Number: [id number] Date of Birth/Sex: Treating RN: 09-26-1985 (34 y.o. M) Primary Care Reeve Mallo: [name] Other Clinician: [name] Referring Modesta Sammons: Treating Riely Oetken/Extender:Stone III, Newell Coral, Ashby Dawes Weeks in Treatment: 15 HBO Safety Checklist Items Safety Checklist Consent Form Signed Patient voided / foley secured and emptied When did you last eato 0700 - peanuts Last dose of injectable or oral agent insulin NA Ostomy pouch emptied and vented if applicable All implantable devices assessed, documented and approved NA Intravenous access site secured and place Valuables secured Linens and cotton and cotton/polyester blend (less than 51% polyester) Personal oil-based products / skin lotions / body lotions removed NA Wigs or hairpieces removed NA Smoking or tobacco materials removed Books / newspapers / magazines / loose paper removed Cologne, aftershave, perfume and deodorant removed Jewelry removed (may wrap wedding band) NA Make-up removed Hair care products removed NA Battery operated devices (external) removed Heating patches and chemical  warmers removed Heating patches and chemical warmers removed NA Titanium eyewear removed NA Nail polish cured greater than 10 hours NA Casting material cured greater than 10 hours NA Hearing aids removed NA Loose dentures or partials removed NA Prosthetics have been removed NA Patient demonstrates correct use of air break device (if applicable) Patient concerns have been addressed Patient grounding bracelet on and cord  attached to chamber Specifics for Inpatients (complete in addition to above) Medication sheet sent with patient Intravenous medications needed or due during therapy sent with patient Drainage tubes (e.g. nasogastric tube or chest tube secured and vented) Endotracheal or Tracheotomy tube secured Cuff deflated of air and inflated with saline Airway suctioned Electronic Signature(s) Signed: 05/01/2019 8:29:58 AM By: [name] EMT/HBOT Entered By: [name] on 05/01/2019 08:29:57

## 2019-05-02 ENCOUNTER — Encounter (HOSPITAL_BASED_OUTPATIENT_CLINIC_OR_DEPARTMENT_OTHER): Payer: BC Managed Care – PPO | Admitting: Internal Medicine

## 2019-05-02 ENCOUNTER — Other Ambulatory Visit: Payer: Self-pay

## 2019-05-02 DIAGNOSIS — E11621 Type 2 diabetes mellitus with foot ulcer: Secondary | ICD-10-CM | POA: Diagnosis not present

## 2019-05-02 LAB — AEROBIC/ANAEROBIC CULTURE W GRAM STAIN (SURGICAL/DEEP WOUND): Gram Stain: NONE SEEN

## 2019-05-02 NOTE — Progress Notes (Signed)
Shon, [name] ([id number]) Visit Report for 05/02/2019 HPI Details Patient Name: Date of Service: Paterson, [name] 05/02/2019 3:45 PM Medical Record [id number] Patient Account Number: [id number] Date of Birth/Sex: Treating RN: [birth year] (34 y.o. 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 He comes in for review of a wound over the plantar fifth metatarsal head extending into the lateral part of the foot. He was followed for this previously by his podiatrist Dr. Cornelius Moras. As the patient tells his story he went to see podiatry first for a swelling he developed on the lateral part of his fifth metatarsal head in May. He states this was "open" by podiatry and the area closed. He was followed up in June and it was again opened callus removed and it closed promptly. There were plans being made for surgery on the fifth metatarsal head in June however his blood sugar was apparently too high for anesthesia. Apparently the area was debrided and opened again in June and it is never closed since. Looking over the records from podiatry I am really not able to follow this. It was clear when he was first seen it was before 5/14 at that point he already had a wound. By 5/17 the ulcer was resolved. I do not see anything about a procedure. On 5/28 noted to have pre-ulcerative moderate keratosis. X-ray noted 1/5 contracted toe and tailor's bunion and metatarsal deformity. On a visit date on 09/28/2018 the dorsal part of the left foot it healed and resolved. There was concern about swelling in his lower extremity he was sent to the ER.. As far as I can tell he was seen in the ER on 7/12 with an ulcer on his left foot. A DVT rule out of the left  leg was negative. I do not think I have complete records from podiatry but I am not able to verify the procedures this patient states he had. He states after the last procedure the wound has never closed although I am not able to follow this in the records I have from podiatry. He has not had a recent x-ray The patient has been using Neosporin on the wound. He is wearing a Darco shoe. He is still very active up on his foot working and exercising. Past medical history; type 2 diabetes ketosis-prone, leg swelling with a negative DVT study in July. Non-smoker ABI in our clinic was 0.85 on the left 10/16; substantial wound on the plantar left fifth met head extending laterally almost to the dorsal fifth MTP. We have been using silver alginate we gave him a Darco forefoot off loader. An x-ray did not show evidence of osteomyelitis did note soft tissue emphysema which I think was due to gas tracking through an open wound. There is no doubt in my mind he requires an MRI 10/23; MRI not booked until 3 November at the earliest this is largely due to his glucose sensor in the right arm. We have been using silver alginate. There has been an improvement 10/29; I am still not exactly sure when his MRI is booked for. He says it is the third but it is the 10th in epic. This definitely needs to be done. He is running a low-grade fever today but no other symptoms. No real improvement in the 1 02/26/2019 patient  presents today for a follow-up visit here in our clinic he is last been seen in the clinic on October 29. Subsequently we were working on getting MRI to evaluate and see what exactly was going on and where we would need to go from the standpoint of whether or not he had osteomyelitis and again what treatments were going be required. Subsequently the patient ended up being admitted to the hospital on 02/07/2019 and was discharged on 02/14/2019. This is a somewhat interesting admission with a discharge  diagnosis of pneumonia due to COVID-19 although he was positive for COVID-19 when tested at the urgent care but negative x2 when he was actually in the hospital. With that being said he did have acute respiratory failure with hypoxia and it was noted he also have a left foot ulceration with osteomyelitis. With that being said he did require oxygen for his pneumonia and I level 4 L. He was placed on antivirals and steroids for the COVID-19. He was also transferred to the [facility] at one point. Nonetheless he did subsequently discharged home and since being home has done much better in that regard. The CT angiogram did not show any pulmonary embolism. With regard to the osteomyelitis the patient was placed on vancomycin and Zosyn while in the hospital but has been changed to Augmentin at discharge. It was also recommended that he follow-up with wound care and podiatry. Podiatry however wanted him to see [name] according to the patient prior to them doing anything further. His hemoglobin A1c was 9.9 as noted in the hospital. Have an MRI of the left foot performed while in the hospital on 02/04/2019. 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 There is an overlying plantar open wound noted an abscess tracking back along the lateral aspect of the fifth metatarsal shaft. There is otherwise diffuse cellulitis and mild fasciitis without findings of polymyositis. The patient did have recently pneumonia secondary to COVID-19 I looked in the chart through epic and it does appear that the patient may need to have an additional x-ray just to ensure everything is cleared and that he has no airspace disease prior to putting him into the chamber. 03/05/2019; patient was readmitted to the clinic last week. He was hospitalized twice for a viral upper respiratory tract infection from 11/1 through 11/4 and then 11/5  through 11/12 ultimately this turned out to be Covid pneumonitis. Although he was discharged on oxygen he is not using it. He says he feels fine. He has no exercise limitation no cough no sputum. His O2 sat in our clinic today was 100% on room air. 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 He received Vanco and Zosyn in the hospital and then was discharged on 2 weeks of Augmentin. I do not see any relevant cultures. He was supposed to follow-up with infectious disease but I do not see that he has an appointment. 12/8; patient saw Dr. Novella Olive of infectious disease last week. He felt that he had had adequate antibiotic therapy. He did not go to follow-up with Dr. Amalia Hailey of podiatry and I have again talked to him about the pros and cons of this. He does not want to consider a ray amputation of this time. He is aware of the risks of recurrence, migration etc. He started HBO  today and tolerated this well. He can complete the Augmentin that I gave him last week. I have looked over the lab work that Dr. Chana Bode ordered his C-reactive protein was 3.3 and his sedimentation rate was 17. The C-reactive protein is never really been measurably that high in this patient 12/15; not much change in the wound today however he has undermining along the lateral part of the foot again more extensively than last week. He has some rims of epithelialization. We have been using silver alginate. He is undergoing hyperbarics but did not dive today 12/18; in for his obligatory first total contact cast change. Unfortunately there was pus coming from the undermining area around his fifth metatarsal head. This was cultured but will preclude reapplication of a cast. He is seen in conjunction with HBO 12/24; patient had staph lugdunensis in the wound in the undermining area laterally last time. We put him  on doxycycline which should have covered this. The wound looks better today. I am going to give him another week of doxycycline before reattempting the total contact cast 12/31; the patient is completing antibiotics. Hemorrhagic debris in the distal part of the wound with some undermining distally. He also had hyper granulation. Extensive debridement with a #5 curette. The infected area that was on the lateral part of the fifth met head is closed over. I do not think he needs any more antibiotics. Patient was seen prior to HBO. Preparations for a total contact cast were made in the cast will be placed post hyperbarics 04/11/19; once again the patient arrives today without complaint. He had been in a cast all week noted that he had heavy drainage this week. This resulted in large raised areas of macerated tissue around the wound 1/14; wound bed looks better slightly smaller. Hydrofera Blue has been changing himself. He had a heavy drainage last week which caused a lot of maceration around the wound so I took him out of a total contact cast he says the drainage is actually better this week He is seen today in conjunction with HBO 1/21; returns to clinic. He was up in [place] for a day or 2 attending a funeral. He comes back in with the wound larger and with a large area of exposed bone. He had osteomyelitis and septic arthritis of the fifth left metatarsal head while he was in hospital. He received IV antibiotics in the hospital for a prolonged period of time then 3 weeks of Augmentin. Subsequently I gave him 2 weeks of doxycycline for more superficial wound infection. When I saw this last week the wound was smaller the surface of the wound looks satisfactory. 1/28; patient missed hyperbarics today. Bone biopsy I did last time showed Enterococcus faecalis and Staphylococcus lugdunensis . He has a wide area of exposed bone. We are going to use silver alginate as of today. I had another ethical  discussion with the patient. 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 In this situation I think the likelihood of healing this is low. Therefore I have recommended a ray amputation and with the patient's agreement I have referred him to Dr. Doran Durand. 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 M) Primary Care Provider: [name] Other Clinician: Referring Provider: Treating Provider/Extender:Meribeth Vitug, [name], [name]: 15 Constitutional Sitting or standing Blood Pressure is within target range for patient.. Pulse regular and within target range for patient.Marland Kitchen Respirations regular, non-labored and within target range.. Temperature is normal and within the target range for the patient.Marland Kitchen Appears in no distress. Cardiovascular Pedal pulses are palpable. Edema present in both extremities.. Notes Wound exam; not much undermining today. However in the middle of this is a wide area of exposed bone which is the fifth metatarsal head. It does not seem to spread all over the met head into the dorsal foot as it once did however the exposed bone was a big deterioration last week Electronic Signature(s) Signed: 05/02/2019 5:54:40 PM By: [name] MD Entered By: [name] on 05/02/2019 17:37:43 -------------------------------------------------------------------------------- Physician Orders  Details Patient Name: Date of Service: Rill, [name] 05/02/2019 3:45 PM Medical Record [id number] Patient Account Number: [id number] Date of Birth/Sex: Treating RN: 05-24-1985 (34 y.o. 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 Dressing Change Frequency Wound #1 Left Metatarsal head fifth Change dressing every day. Skin Barriers/Peri-Wound Care Skin Prep Wound Cleansing Wound #1 Left Metatarsal head fifth May shower and wash wound with soap and water. Primary Wound Dressing Wound #1 Left Metatarsal head fifth Calcium Alginate with Silver Secondary Dressing Wound #1 Left Metatarsal head fifth Kerlix/Rolled Gauze Dry Gauze Other: - felt Edema Control Avoid standing for long periods of time Elevate legs to the level of the heart or above for 30 minutes daily and/or when sitting, a frequency of: - throughout the day. Off-Loading Wedge shoe to: - left foot Additional Orders / Instructions Other: - patient to go to outpatient [facility] to have lab work done as MD directed. Hyperbaric Oxygen Therapy Discontinue HBO Therapy - Discussed with patient. He has elected to proceed with a ray amputation [facility] - refer Dr. Wylene [facility] at [facility] related to osteomyelitis left 5th met head possibility of 5th ray amputation. phone number [phone] ICD 10 code E11.621 -  (ICD10 [id number] - Other chronic osteomyelitis, left ankle and foot) Electronic Signature(s) Signed: 05/02/2019 5:54:40 PM By: [name] MD Entered By: [name] on 05/02/2019 17:39:15 -------------------------------------------------------------------------------- Prescription 05/02/2019 Patient Name: Lipuma, [name] Provider: [name] MD Date of Birth: 04/27/1985 NPI#: [id number] Sex: M DEA#: [id number] Phone #: [phone] License #: [id number] Patient Address: [place] [address] [place], [place] [address] [place], [place] [address] [phone] Allergies metformin Provider's Orders Charlie Pitter ICD10: S50.539 - refer Dr. Wylene [facility]mer [facility] at [facility] related to osteomyelitis left 5th met head possibility of 5th ray amputation. phone number [phone] ICD 10 code E11.621 Signature(s): Date(s): Electronic Signature(s) Signed: 05/02/2019 5:54:40 PM By: [name] MD Entered By: [name] on 05/02/2019 17:39:15 --------------------------------------------------------------------------------  Problem List Details Patient Name: Date  of Service: Lauro, [name] 05/02/2019 3:45 PM Medical Record [id number] Patient Account Number: [id number] Date of Birth/Sex: Treating RN: 10/16/1985 (34 y.o. 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 He comes in for review of a wound over the plantar fifth metatarsal head extending into the lateral part of the foot. He was followed for this previously by his podiatrist Dr. Cornelius Moras. As the patient tells his story he went to see podiatry first for a swelling he developed on the lateral part of his fifth metatarsal head in May. He states this was "open" by podiatry and the area closed. He was followed up in June and it was again opened callus removed and it closed promptly. There were plans being made for surgery on the fifth metatarsal head in June however his blood sugar was apparently too high for anesthesia. Apparently the area was debrided and opened again in June and it is never closed since. Looking over the records from podiatry I am really not able to follow this. It was clear when he was first seen it was before 5/14 at that point he already had a wound. By 5/17 the ulcer was resolved. I do not see anything about a procedure. On 5/28 noted to have pre-ulcerative moderate keratosis. X-ray noted 1/5 contracted toe and tailor's bunion and metatarsal deformity. On a visit  date on 09/28/2018 the dorsal part of the left foot it healed and resolved. There was concern about swelling in his lower extremity he was sent to the ER.. As far as I can tell he was seen in the ER on 7/12 with an ulcer on his left foot. A DVT rule out of the left leg was negative. I do not think I have complete records from podiatry but I am not able to verify the procedures this patient states he had. He states after the last procedure the wound has never closed although I am not able to follow this in the records I have from podiatry. He has not had a recent x-ray The patient has been using Neosporin on the wound. He is wearing a Darco shoe. He is still very active up on his foot working and exercising. Past medical history; type 2 diabetes ketosis-prone, leg swelling with a negative DVT study in July. Non-smoker ABI in our clinic was 0.85 on the left 10/16; substantial wound on the plantar left fifth met head extending laterally almost to the dorsal fifth MTP. We have been  using silver alginate we gave him a Darco forefoot off loader. An x-ray did not show evidence of osteomyelitis did note soft tissue emphysema which I think was due to gas tracking through an open wound. There is no doubt in my mind he requires an MRI 10/23; MRI not booked until 3 November at the earliest this is largely due to his glucose sensor in the right arm. We have been using silver alginate. There has been an improvement 10/29; I am still not exactly sure when his MRI is booked for. He says it is the third but it is the 10th in epic. This definitely needs to be done. He is running a low-grade fever today but no other symptoms. No real improvement in the 1 02/26/2019 patient presents today for a follow-up visit here in our clinic he is last been seen in the clinic on October 29. Subsequently we were working on getting MRI to evaluate and see what exactly was going on and where we would need to go from the standpoint  of whether or not he had osteomyelitis and again what treatments were going be required. Subsequently the patient ended up being admitted to the hospital on 02/07/2019 and was discharged on 02/14/2019. This is a somewhat interesting admission with a discharge diagnosis of pneumonia due to COVID-19 although he was positive for COVID-19 when tested at the urgent care but negative x2 when he was actually in the hospital. With that being said he did have acute respiratory failure with hypoxia and it was noted he also have a left foot ulceration with osteomyelitis. With that being said he did require oxygen for his pneumonia and I level 4 L. He was placed on antivirals and steroids for the COVID-19. He was also transferred to the [facility] at one point. Nonetheless he did subsequently discharged home and since being home has done much better in that regard. The CT angiogram did not show any pulmonary embolism. With regard to the osteomyelitis the patient was placed on vancomycin and Zosyn while in the hospital but has been changed to Augmentin at discharge. It was also recommended that he follow-up with wound care and podiatry. Podiatry however wanted him to see [name] according to the patient prior to them doing anything further. His hemoglobin A1c was 9.9 as noted in the hospital. Have an MRI of the left foot performed while in the hospital on 02/04/2019. 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 There is an overlying plantar open wound noted an abscess tracking back along the lateral aspect of the fifth metatarsal shaft. There is otherwise diffuse cellulitis and mild fasciitis without findings of polymyositis. The patient did have recently pneumonia secondary to COVID-19 I looked in the chart through epic and it does appear that the patient may need to have an additional x-ray just to ensure  everything is cleared and that he has no airspace disease prior to putting him into the chamber. 03/05/2019; patient was readmitted to the clinic last week. He was hospitalized twice for a viral upper respiratory tract infection from 11/1 through 11/4 and then 11/5 through 11/12 ultimately this turned out to be Covid pneumonitis. Although he was discharged on oxygen he is not using it. He says he feels fine. He has no exercise limitation no cough no sputum. His O2 sat in our clinic today was 100% on room air. He did manage to have his  MRI which showed septic arthritis at the fifth MTP joint and osteomyelitis involving the fifth metatarsal head and proximal phalanx. He received Vanco and Zosyn in the hospital and then was discharged on 2 weeks of Augmentin. I do not see any relevant cultures. He was supposed to follow-up with infectious disease but I do not see that he has an appointment. 12/8; patient saw Dr. Novella Olive of infectious disease last week. He felt that he had had adequate antibiotic therapy. He did not go to follow-up with Dr. Amalia Hailey of podiatry and I have again talked to him about the pros and cons of this. He does not want to consider a ray amputation of this time. He is aware of the risks of recurrence, migration etc. He started HBO today and tolerated this well. He can complete the Augmentin that I gave him last week. I have looked over the lab work that Dr. Chana Bode ordered his C-reactive protein was 3.3 and his sedimentation rate was 17. The C-reactive protein is never really been measurably that high in this patient 12/15; not much change in the wound today however he has undermining along the lateral part of the foot again more extensively than last week. He has some rims of epithelialization. We have been using silver alginate. He is undergoing hyperbarics but did not dive today 12/18; in for his obligatory first total contact cast change. Unfortunately there was pus coming from the  undermining area around his fifth metatarsal head. This was cultured but will preclude reapplication of a cast. He is seen in conjunction with HBO 12/24; patient had staph lugdunensis in the wound in the undermining area laterally last time. We put him on doxycycline which should have covered this. The wound looks better today. I am going to give him another week of doxycycline before reattempting the total contact cast 12/31; the patient is completing antibiotics. Hemorrhagic debris in the distal part of the wound with some undermining distally. He also had hyper granulation. Extensive debridement with a #5 curette. The infected area that was on the lateral part of the fifth met head is closed over. I do not think he needs any more antibiotics. Patient was seen prior to HBO. Preparations for a total contact cast were made in the cast will be placed post hyperbarics 04/11/19; once again the patient arrives today without complaint. He had been in a cast all week noted that he had heavy drainage this week. This resulted in large raised areas of macerated tissue around the wound 1/14; wound bed looks better slightly smaller. Hydrofera Blue has been changing himself. He had a heavy drainage last week which caused a lot of maceration around the wound so I took him out of a total contact cast he says the drainage is actually better this week He is seen today in conjunction with HBO 1/21; returns to clinic. He was up in [place] for a day or 2 attending a funeral. He comes back in with the wound larger and with a large area of exposed bone. He had osteomyelitis and septic arthritis of the fifth left metatarsal head while he was in hospital. He received IV antibiotics in the hospital for a prolonged period of time then 3 weeks of Augmentin. Subsequently I gave him 2 weeks of doxycycline for more superficial wound infection. When I saw this last week the wound was smaller the surface of the wound looks  satisfactory. 1/28; patient missed hyperbarics today. Bone biopsy I did last time showed Enterococcus faecalis  and Staphylococcus lugdunensis . He has a wide area of exposed bone. We are going to use silver alginate as of today. I had another ethical discussion with the patient. 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 In this situation I think the likelihood of healing this is low. Therefore I have recommended a ray amputation and with the patient's agreement I have referred him to Dr. Doran Durand. The other issue is that his compliance with hyperbarics has been minimal because of his work schedule and given his underlying decision I am going to stop this today Objective Constitutional Sitting or standing Blood Pressure is within target range for patient.. Pulse regular and within target range for patient.Marland Kitchen Respirations regular, non-labored and within target range.. Temperature is normal and within the target range for the patient.Marland Kitchen Appears in no distress. Vitals Time Taken: 4:29 PM, Height: 77 in, Weight: 260 lbs, BMI: 30.8, Temperature: 98.2 F, Pulse: 94 bpm, Respiratory Rate: 18 breaths/min, Blood Pressure: 141/82 mmHg, Capillary Blood Glucose: 220 mg/dl. General Notes: glucose per pt report Cardiovascular Pedal pulses are palpable. Edema present in both extremities.. General Notes: Wound exam; not much undermining today. However in the middle of this is a wide area of exposed bone which is the fifth metatarsal head. It does not seem to spread all over the met head into the dorsal foot as it once did however the exposed bone was a big deterioration last week Integumentary (Hair, Skin) Wound #1 status is Open. Original cause of wound was Trauma. The wound is located on the Left Metatarsal head fifth. The wound measures 2.6cm length x 2.4cm width x 0.7cm depth; 4.901cm^2 area and 3.431cm^3 volume. There is bone and Fat Layer (Subcutaneous Tissue) Exposed  exposed. There is no tunneling noted, however, there is undermining starting at 1:00 and ending at 4:00 with a maximum distance of 1cm. There is a medium amount of serosanguineous drainage noted. The wound margin is well defined and not attached to the wound base. There is large (67-100%) pink granulation within the wound bed. There is a small (1-33%) amount of necrotic tissue within the wound bed including Adherent Slough. 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 Dressing Change Frequency: Wound #1 Left Metatarsal head fifth: Change dressing every day. Skin Barriers/Peri-Wound Care: Skin Prep Wound Cleansing: Wound #1 Left Metatarsal head fifth: May shower and wash wound with soap and water. Primary Wound Dressing: Wound #1 Left Metatarsal head fifth: Calcium Alginate with Silver Secondary Dressing: Wound #1 Left Metatarsal head fifth: Kerlix/Rolled Gauze Dry Gauze Other: - felt Edema Control: Avoid standing for long periods of time Elevate legs to the level of the heart or above for 30 minutes daily and/or when sitting, a frequency of: - throughout the day. Off-Loading: Wedge shoe to: - left foot Additional Orders / Instructions: Other: - patient to go to [facility] to have lab work done as MD directed. Hyperbaric Oxygen Therapy: Discontinue HBO Therapy - Discussed with patient. He has elected to proceed with a ray amputation ordered were: Charlie Pitter - refer Dr. Wylene Simmer [facility] at [facility] related to osteomyelitis left 5th met head possibility of 5th ray amputation. phone number [phone] ICD 10 code E11.621 1.  Deteriorating situation with recurrent osteomyelitis. I think the likelihood of healing this with IV antibiotics and hyperbarics is very low and the risk that the patient of spread of infection and creating a worse clinical situation for the patient is high. We have therefore agreed to the possibility of 5th ray amputation. I have referred him to Dr. Doran Durand of orthopedics for his consideration of this 2. In view of #1 I do not think continued hyperbarics is warranted. 3. If the patient's appointment with Dr. Doran Durand is delayed either because of different issues or the pandemic I will put him on antibiotics. 4. He continues to have increasing swelling in his bilateral lower extremities. This does not look like cellulitis or venous thromboembolic disease. I asked him to make an appointment with Dr. Delfina Redwood who is his primary doctor. I suspect he will require lab work. I ordered this last time but he did not go through with it Electronic Signature(s) Signed: 05/02/2019 5:54:40 PM By: [name] MD Entered By: [name] on 05/02/2019 17:40:59 -------------------------------------------------------------------------------- SuperBill Details Patient Name: Date of Service: Fantini, [name] 05/02/2019 Medical Record [id number] Patient Account Number: [id number] Date of Birth/Sex: Treating RN: 09-07-1985 (34 y.o. [name], Meta.Reding Primary Care Provider: [name] Other Clinician: Referring Provider: Treating Provider/Extender:Nyal Schachter, [name], [name]: 15 Diagnosis Coding ICD-10 Codes Code Description E11.621 Type 2 diabetes mellitus with foot ulcer L97.521 Non-pressure chronic ulcer of other part of left foot limited to breakdown of skin E11.42 Type 2 diabetes mellitus with diabetic polyneuropathy M86.672 Other chronic osteomyelitis, left ankle and foot M00.9 Pyogenic arthritis, unspecified Facility Procedures CPT4 Code: 75449201 Description:  99214 - WOUND CARE VISIT-LEV 4 EST PT Modifier: Quantity: 1 Physician Procedures CPT4 Code Description: 0071219 99214 - WC PHYS LEVEL 4 - EST PT ICD-10 Diagnosis Description M86.672 Other chronic osteomyelitis, left ankle and foot E11.621 Type 2 diabetes mellitus with foot ulcer L97.521 Non-pressure chronic ulcer of other part of  left foot limited Modifier: to breakdown of Quantity: 1 skin Electronic Signature(s) Signed: 05/02/2019 5:54:40 PM By: [name] MD Entered By: [name] on 05/02/2019 17:41:19

## 2019-05-03 ENCOUNTER — Encounter (HOSPITAL_BASED_OUTPATIENT_CLINIC_OR_DEPARTMENT_OTHER): Payer: BC Managed Care – PPO | Admitting: Internal Medicine

## 2019-05-06 NOTE — Progress Notes (Addendum)
Couey, [name] ([id number]) Visit Report for 05/02/2019 Arrival Information Details Patient Name: Date of Service: [name], [name] 05/02/2019 3:45 PM Medical Record Number:[id number] Patient Account Number: [id number] Date of Birth/Sex: Treating RN: Apr 23, 1985 (34 y.o. [name] Primary Care Gearld Kerstein: Katy Apo Other Clinician: Referring Kenesha Moshier: Treating Bacilio Abascal/Extender:Robson, Almira Coaster, Chalmers Guest in Treatment: 15 Visit Information History Since Last Visit Added or deleted any medications: No Patient Arrived: Ambulatory Any new allergies or adverse reactions: No Arrival Time: 16:26 Had a fall or experienced change in No Accompanied By: alone activities of daily living that may affect Transfer Assistance: None risk of falls: Patient Identification Verified: Yes Signs or symptoms of abuse/neglect since No Secondary Verification Process Yes last visito Completed: Hospitalized since last visit: No Patient Requires Transmission-Based No Implantable device outside of the clinic No Precautions: excluding Patient Has Alerts: No cellular tissue based products placed in the center since last visit: Has Dressing in Place as Prescribed: Yes Has Footwear/Offloading in Place as Yes Prescribed: Left: Wedge Shoe Pain Present Now: No Electronic Signature(s) Signed: 05/06/2019 6:39:44 PM By: Zandra Abts RN, BSN Entered By: Zandra Abts on 05/02/2019 16:27:50 -------------------------------------------------------------------------------- Clinic Level of Care Assessment Details Patient Name: Date of Service: [name], [name] 05/02/2019 3:45 PM Medical Record Number:[id number] Patient Account Number: [id number] Date of Birth/Sex: Treating RN: [birth year] (33 y.o. [name] Primary Care Jaeleah Smyser: Katy Apo Other Clinician: Referring Emillee Talsma: Treating Joquan Lotz/Extender:Robson, Almira Coaster, Chalmers Guest in Treatment: 15 Clinic Level of  Care Assessment Items TOOL 4 Quantity Score X - Use when only an EandM is performed on FOLLOW-UP visit 1 0 ASSESSMENTS - Nursing Assessment / Reassessment X - Reassessment of Co-morbidities (includes updates in patient status) 1 10 X - Reassessment of Adherence to Treatment Plan 1 5 ASSESSMENTS - Wound and Skin Assessment / Reassessment []  - Simple Wound Assessment / Reassessment - one wound 0 X - Complex Wound Assessment / Reassessment - multiple wounds 1 5 X - Dermatologic / Skin Assessment (not related to wound area) 1 10 ASSESSMENTS - Focused Assessment X - Circumferential Edema Measurements - multi extremities 1 5 X - Nutritional Assessment / Counseling / Intervention 1 10 []  - Lower Extremity Assessment (monofilament, tuning fork, pulses) 0 []  - Peripheral Arterial Disease Assessment (using hand held doppler) 0 ASSESSMENTS - Ostomy and/or Continence Assessment and Care []  - Incontinence Assessment and Management 0 []  - Ostomy Care Assessment and Management (repouching, etc.) 0 PROCESS - Coordination of Care []  - Simple Patient / Family Education for ongoing care 0 X - Complex (extensive) Patient / Family Education for ongoing care 1 20 X - Staff obtains , Records, Test Results / Process Orders 1 10 []  - Staff telephones HHA, Nursing Homes / Clarify orders / etc 0 []  - Routine Transfer to another Facility (non-emergent condition) 0 []  - Routine Hospital Admission (non-emergent condition) 0 []  - New Admissions / / Ordering NPWT, Apligraf, etc. 0 []  - Emergency Hospital Admission (emergent condition) 0 []  - Simple Discharge Coordination 0 X - Complex (extensive) Discharge Coordination 1 15 PROCESS - Special Needs []  - Pediatric / Minor Patient Management 0 []  - Isolation Patient Management 0 []  - Hearing / Language / Visual special needs 0 []  - Assessment of Community assistance (transportation, D/C planning, etc.) 0 []  - Additional assistance /  Altered mentation 0 []  - Support Surface(s) Assessment (bed, cushion, seat, etc.) 0 INTERVENTIONS - Wound Cleansing / Measurement []  - Simple Wound Cleansing - one wound 0  X - Complex Wound Cleansing - multiple wounds 1 5 X - Wound Imaging (photographs - any number of wounds) 1 5 []  - Wound Tracing (instead of photographs) 0 []  - Simple Wound Measurement - one wound 0 X - Complex Wound Measurement - multiple wounds 1 5 INTERVENTIONS - Wound Dressings []  - Small Wound Dressing one or multiple wounds 0 X - Medium Wound Dressing one or multiple wounds 1 15 []  - Large Wound Dressing one or multiple wounds 0 []  - Application of Medications - topical 0 []  - Application of Medications - injection 0 INTERVENTIONS - Miscellaneous []  - External ear exam 0 []  - Specimen Collection (cultures, biopsies, blood, body fluids, etc.) 0 []  - Specimen(s) / Culture(s) sent or taken to Lab for analysis 0 []  - Patient Transfer (multiple staff / / Similar devices) 0 []  - Simple Staple / Suture removal (25 or less) 0 []  - Complex Staple / Suture removal (26 or more) 0 []  - Hypo / Hyperglycemic Management (close monitor of Blood Glucose) 0 []  - Ankle / Brachial Index (ABI) - do not check if billed separately 0 X - Vital Signs 1 5 Has the patient been seen at the hospital within the last three years: Yes Total Score: 125 Level Of Care: New/Established - Level 4 Electronic Signature(s) Signed: 05/02/2019 6:05:55 PM By: Entered By: on 05/02/2019 17:40:00 -------------------------------------------------------------------------------- Lower Extremity Assessment Details Patient Name: Date of Service: [name], 05/02/2019 3:45 PM Medical Record Number:[id number] Patient Account Number: Date of Birth/Sex: Treating RN: [birth year] (33 y.o. Primary Care Libbie Bartley: Nurse, adult Other Clinician: Referring Tamarcus Condie: Treating  Veida Spira/Extender:Robson, , Weeks in Treatment: 15 Edema Assessment Assessed: [Left: No] [Right: No] Edema: [Left: Ye] [Right: s] Calf Left: Right: Point of Measurement: 48 cm From Medial Instep 46 cm cm Ankle Left: Right: Point of Measurement: 12 cm From Medial Instep 31 cm cm Vascular Assessment Pulses: Dorsalis Pedis Palpable: [Left:Yes] Electronic Signature(s) Signed: 05/06/2019 6:39:44 PM By: RN, BSN Entered By: 05/04/2019 on 05/02/2019 16:36:09 -------------------------------------------------------------------------------- Multi Wound Chart Details Patient Name: Date of Service: [name], [name] 05/02/2019 3:45 PM Medical Record Number:[id number] Patient Account Number: [name] Date of Birth/Sex: Treating RN: 11/05/1985 (33 y.o. M) Primary Care Jaiya Mooradian: [id number] Other Clinician: Referring Mashawn Brazil: Treating Juliet Vasbinder/Extender:Robson, 09/27/1985, [name] in Treatment: 15 Vital Signs Height(in): 77 Capillary Blood 220 Glucose(mg/dl): Weight(lbs): Katy Apo Pulse(bpm): 94 Body Mass Index(BMI): 31 Blood Pressure(mmHg): 141/82 Temperature(F): 98.2 Respiratory 18 Rate(breaths/min): Photos: [1:No Photos] [N/A:N/A] Wound Location: [1:Left Metatarsal head fifth N/A] Wounding Event: [1:Trauma] [N/A:N/A] Primary Etiology: [1:Diabetic Wound/Ulcer of the N/A Lower Extremity] Comorbid History: [1:Type II Diabetes] [N/A:N/A] Date Acquired: [1:08/03/2018] [N/A:N/A] Weeks of Treatment: [1:15] [N/A:N/A] Wound Status: [1:Open] [N/A:N/A] Measurements L x W x D 2.6x2.4x0.7 [N/A:N/A] (cm) Area (cm) : [1:4.901] [N/A:N/A] Volume (cm) : [1:3.431] [N/A:N/A] % Reduction in Area: [1:-66.90%] [N/A:N/A] % Reduction in Volume: -46.00% [N/A:N/A] Starting Position 1 1 (o'clock): Ending Position 1 [1:4] (o'clock): Maximum Distance 1 [1:1] (cm): Undermining: [1:Yes] [N/A:N/A] Classification: [1:Grade 3] [N/A:N/A] Exudate  Amount: [1:Medium] [N/A:N/A] Exudate Type: [1:Serosanguineous] [N/A:N/A] Exudate Color: [1:red, brown] [N/A:N/A] Wound Margin: [1:Well defined, not attached N/A] Granulation Amount: [1:Large (67-100%)] [N/A:N/A] Granulation Quality: [1:Pink] [N/A:N/A] Necrotic Amount: [1:Small (1-33%)] [N/A:N/A] Exposed Structures: [1:Fat Layer (Subcutaneous N/A Tissue) Exposed: Yes Bone: Yes Fascia: No Tendon: No Muscle: No Joint: No Small (1-33%)] [N/A:N/A] Treatment Notes Electronic Signature(s) Signed: 05/02/2019 5:54:40 PM By: 07/04/2019 MD  Entered By: Baltazar Najjar on 05/02/2019 17:35:01 -------------------------------------------------------------------------------- Multi-Disciplinary Care Plan Details Patient Name: Date of Service: [name], [name] 05/02/2019 3:45 PM Medical Record Number:[id number] Patient Account Number: [id number] Date of Birth/Sex: Treating RN: 1986-03-07 (34 y.o. [name] Primary Care Elizette Shek: Katy Apo Other Clinician: Referring Rodrigus Kilker: Treating Jyquan Kenley/Extender:Robson, Almira Coaster, Chalmers Guest in Treatment: 15 Active Inactive Electronic Signature(s) Signed: 05/16/2019 5:37:02 PM By: [name] Previous Signature: 05/02/2019 6:05:55 PM Version By: [name] Entered By: [name] on 05/16/2019 17:37:01 -------------------------------------------------------------------------------- Pain Assessment Details Patient Name: Date of Service: [name], [name] 05/02/2019 3:45 PM Medical Record Number:[id number] Patient Account Number: [id number] Date of Birth/Sex: Treating RN: 12/28/1985 (33 y.o. [name] Primary Care Jeromie Gainor: Katy Apo Other Clinician: Referring Toniya Rozar: Treating Kinjal Neitzke/Extender:Robson, Almira Coaster, Chalmers Guest in Treatment: 15 Active Problems Location of Pain Severity and Description of Pain Patient Has Paino No Site Locations Pain Management and Medication Current Pain  Management: Electronic Signature(s) Signed: 05/06/2019 6:39:44 PM By: Zandra Abts RN, BSN Entered By: Zandra Abts on 05/02/2019 16:28:00 -------------------------------------------------------------------------------- Patient/Caregiver Education Details Patient Name: Date of Service: [name], [name] 1/28/2021andnbsp3:45 PM Medical Record Number:[id number] Patient Account Number: [id number] Date of Birth/Gender: Treating RN: 1985/12/22 (33 y.o. [name] Primary Care Physician: Katy Apo Other Clinician: Referring Physician: Treating Physician/Extender:Robson, Almira Coaster, Chalmers Guest in Treatment: 15 Education Assessment Education Provided To: Patient Education Topics Provided Wound/Skin Impairment: Handouts: Skin Care Do's and Dont's Methods: Explain/Verbal Responses: Reinforcements needed Electronic Signature(s) Signed: 05/02/2019 6:05:55 PM By: [name] Entered By: [name] on 05/02/2019 16:50:57 -------------------------------------------------------------------------------- Wound Assessment Details Patient Name: Date of Service: [name], [name] 05/02/2019 3:45 PM Medical Record Number:[id number] Patient Account Number: [id number] Date of Birth/Sex: Treating RN: [birth year] (33 y.o. M) Primary Care Verona Hartshorn: Katy Apo Other Clinician: Referring Equilla Que: Treating Monifa Blanchette/Extender:Robson, Almira Coaster, Chalmers Guest in Treatment: 15 Wound Status Wound Number: 1 Primary Diabetic Wound/Ulcer of the Lower Etiology: Extremity Wound Location: Left Metatarsal head fifth Wound Status: Open Wounding Event: Trauma Comorbid Type II Diabetes Date Acquired: 08/03/2018 History: Weeks Of Treatment: 15 Clustered Wound: No Photos Wound Measurements Length: (cm) 2.6 % Reduction Width: (cm) 2.4 % Reduction Depth: (cm) 0.7 Epithelializ Area: (cm) 4.901 Tunneling: Volume: (cm) 3.431 Undermining Starting Ending Po Maximum D in Area:  -66.9% in Volume: -46% ation: Small (1-33%) No : Yes Position (o'clock): 1 sition (o'clock): 4 istance: (cm) 1 Wound Description Classification: Grade 3 Foul Odor A Wound Margin: Well defined, not attached Slough/Fibr Exudate Amount: Medium Exudate Type: Serosanguineous Exudate Color: red, brown Wound Bed Granulation Amount: Large (67-100%) Granulation Quality: Pink Fascia Expos Necrotic Amount: Small (1-33%) Fat Layer (S Necrotic Quality: Adherent Slough Tendon Expos Muscle Expos Joint Expose Bone Exposed fter Cleansing: No ino Yes Exposed Structure ed: No ubcutaneous Tissue) Exposed: Yes ed: No ed: No d: No : Yes Electronic Signature(s) Signed: 05/03/2019 4:26:31 PM By: [name] EMT/HBOT Entered By: [name] on 05/03/2019 15:23:30 -------------------------------------------------------------------------------- Vitals Details Patient Name: Date of Service: [name], [name] 05/02/2019 3:45 PM Medical Record Number:[id number] Patient Account Number: [id number] Date of Birth/Sex: Treating RN: 1985/04/29 (33 y.o. [name] Primary Care Ronney Honeywell: Katy Apo Other Clinician: Referring Mihran Lebarron: Treating Bobbi Kozakiewicz/Extender:Robson, Almira Coaster, Chalmers Guest in Treatment: 15 Vital Signs Time Taken: 16:29 Temperature (F): 98.2 Height (in): 77 Pulse (bpm): 94 Weight (lbs): 260 Respiratory Rate (breaths/min): 18 Body Mass Index (BMI): 30.8 Blood Pressure (mmHg): 141/82 Capillary Blood Glucose (mg/dl): 503 Reference Range: 80 - 120 mg / dl Notes  glucose per pt report Electronic Signature(s) Signed: 05/06/2019 6:39:44 PM By: [name] RN, BSN Entered By: [name] on 05/02/2019 16:29:56

## 2019-05-09 ENCOUNTER — Encounter (HOSPITAL_BASED_OUTPATIENT_CLINIC_OR_DEPARTMENT_OTHER): Payer: BC Managed Care – PPO | Admitting: Internal Medicine

## 2019-05-10 ENCOUNTER — Other Ambulatory Visit: Payer: Self-pay

## 2019-05-10 ENCOUNTER — Encounter (HOSPITAL_BASED_OUTPATIENT_CLINIC_OR_DEPARTMENT_OTHER): Payer: Self-pay | Admitting: Orthopedic Surgery

## 2019-05-10 ENCOUNTER — Other Ambulatory Visit (HOSPITAL_COMMUNITY): Payer: Self-pay | Admitting: Orthopedic Surgery

## 2019-05-13 ENCOUNTER — Other Ambulatory Visit (HOSPITAL_COMMUNITY): Payer: BC Managed Care – PPO

## 2019-05-13 NOTE — Progress Notes (Signed)
      Enhanced Recovery after Surgery for Orthopedics Enhanced Recovery after Surgery is a protocol used to improve the stress on your body and your recovery after surgery.  Patient Instructions  . The night before surgery:  o No food after midnight. ONLY clear liquids after midnight  . The day of surgery (if you do NOT have diabetes):  o Drink ONE (1) Pre-Surgery Clear Ensure as directed.   o This drink was given to you during your hospital  pre-op appointment visit. o The pre-op nurse will instruct you on the time to drink the  Pre-Surgery Ensure depending on your surgery time. o Finish the drink at the designated time by the pre-op nurse.  o Nothing else to drink after completing the  Pre-Surgery Clear Ensure.  . The day of surgery (if you have diabetes): o Drink ONE (1) Gatorade 2 (G2) as directed. o This drink was given to you during your hospital  pre-op appointment visit.  o The pre-op nurse will instruct you on the time to drink the   Gatorade 2 (G2) depending on your surgery time. o Color of the Gatorade may vary. Red is not allowed. o Nothing else to drink after completing the  Gatorade 2 (G2).         If you have questions, please contact your surgeon's office. 

## 2019-05-14 ENCOUNTER — Other Ambulatory Visit (HOSPITAL_COMMUNITY)
Admission: RE | Admit: 2019-05-14 | Discharge: 2019-05-14 | Disposition: A | Discharge status: Home / Self Care | Payer: BC Managed Care – PPO | Source: Ambulatory Visit | Attending: Orthopedic Surgery | Admitting: Orthopedic Surgery

## 2019-05-14 DIAGNOSIS — Z794 Long term (current) use of insulin: Secondary | ICD-10-CM | POA: Diagnosis not present

## 2019-05-14 DIAGNOSIS — E1169 Type 2 diabetes mellitus with other specified complication: Secondary | ICD-10-CM | POA: Diagnosis not present

## 2019-05-14 DIAGNOSIS — Z888 Allergy status to other drugs, medicaments and biological substances status: Secondary | ICD-10-CM | POA: Diagnosis not present

## 2019-05-14 DIAGNOSIS — E11621 Type 2 diabetes mellitus with foot ulcer: Secondary | ICD-10-CM | POA: Diagnosis not present

## 2019-05-14 DIAGNOSIS — L97526 Non-pressure chronic ulcer of other part of left foot with bone involvement without evidence of necrosis: Secondary | ICD-10-CM | POA: Diagnosis not present

## 2019-05-14 DIAGNOSIS — Z8616 Personal history of COVID-19: Secondary | ICD-10-CM | POA: Diagnosis not present

## 2019-05-14 DIAGNOSIS — Z833 Family history of diabetes mellitus: Secondary | ICD-10-CM | POA: Diagnosis not present

## 2019-05-14 DIAGNOSIS — M869 Osteomyelitis, unspecified: Secondary | ICD-10-CM | POA: Diagnosis present

## 2019-05-14 DIAGNOSIS — Z8249 Family history of ischemic heart disease and other diseases of the circulatory system: Secondary | ICD-10-CM | POA: Diagnosis not present

## 2019-05-14 LAB — SARS CORONAVIRUS 2 (TAT 6-24 HRS): SARS Coronavirus 2: NEGATIVE

## 2019-05-16 ENCOUNTER — Ambulatory Visit (HOSPITAL_BASED_OUTPATIENT_CLINIC_OR_DEPARTMENT_OTHER): Payer: BC Managed Care – PPO | Admitting: Certified Registered"

## 2019-05-16 ENCOUNTER — Encounter (HOSPITAL_BASED_OUTPATIENT_CLINIC_OR_DEPARTMENT_OTHER): Payer: Self-pay | Admitting: Orthopedic Surgery

## 2019-05-16 ENCOUNTER — Encounter (HOSPITAL_BASED_OUTPATIENT_CLINIC_OR_DEPARTMENT_OTHER)
Admission: RE | Disposition: A | Discharge status: Home / Self Care | Payer: Self-pay | Source: Home / Self Care | Attending: Orthopedic Surgery

## 2019-05-16 ENCOUNTER — Other Ambulatory Visit: Payer: Self-pay

## 2019-05-16 ENCOUNTER — Ambulatory Visit (HOSPITAL_BASED_OUTPATIENT_CLINIC_OR_DEPARTMENT_OTHER)
Admission: RE | Admit: 2019-05-16 | Discharge: 2019-05-16 | Disposition: A | Discharge status: Home / Self Care | Payer: BC Managed Care – PPO | Attending: Orthopedic Surgery | Admitting: Orthopedic Surgery

## 2019-05-16 ENCOUNTER — Encounter (HOSPITAL_BASED_OUTPATIENT_CLINIC_OR_DEPARTMENT_OTHER): Payer: BC Managed Care – PPO | Admitting: Internal Medicine

## 2019-05-16 DIAGNOSIS — L97526 Non-pressure chronic ulcer of other part of left foot with bone involvement without evidence of necrosis: Secondary | ICD-10-CM | POA: Insufficient documentation

## 2019-05-16 DIAGNOSIS — Z888 Allergy status to other drugs, medicaments and biological substances status: Secondary | ICD-10-CM | POA: Insufficient documentation

## 2019-05-16 DIAGNOSIS — Z8616 Personal history of COVID-19: Secondary | ICD-10-CM | POA: Insufficient documentation

## 2019-05-16 DIAGNOSIS — Z8249 Family history of ischemic heart disease and other diseases of the circulatory system: Secondary | ICD-10-CM | POA: Insufficient documentation

## 2019-05-16 DIAGNOSIS — Z833 Family history of diabetes mellitus: Secondary | ICD-10-CM | POA: Insufficient documentation

## 2019-05-16 DIAGNOSIS — M86172 Other acute osteomyelitis, left ankle and foot: Secondary | ICD-10-CM

## 2019-05-16 DIAGNOSIS — M869 Osteomyelitis, unspecified: Secondary | ICD-10-CM | POA: Insufficient documentation

## 2019-05-16 DIAGNOSIS — E11621 Type 2 diabetes mellitus with foot ulcer: Secondary | ICD-10-CM | POA: Diagnosis not present

## 2019-05-16 DIAGNOSIS — Z794 Long term (current) use of insulin: Secondary | ICD-10-CM | POA: Insufficient documentation

## 2019-05-16 DIAGNOSIS — E1169 Type 2 diabetes mellitus with other specified complication: Secondary | ICD-10-CM | POA: Insufficient documentation

## 2019-05-16 HISTORY — PX: AMPUTATION: SHX166

## 2019-05-16 LAB — GLUCOSE, CAPILLARY
Glucose-Capillary: 152 mg/dL — ABNORMAL HIGH (ref 70–99)
Glucose-Capillary: 161 mg/dL — ABNORMAL HIGH (ref 70–99)

## 2019-05-16 SURGERY — AMPUTATION, FOOT, RAY
Anesthesia: General | Site: Fifth Toe | Laterality: Left

## 2019-05-16 MED ORDER — HYDROMORPHONE HCL 1 MG/ML IJ SOLN: 0.2500 mg | INTRAMUSCULAR | Status: DC | PRN

## 2019-05-16 MED ORDER — PROPOFOL 10 MG/ML IV BOLUS
INTRAVENOUS | Status: DC | PRN
  Administered 2019-05-16: 300 mg via INTRAVENOUS

## 2019-05-16 MED ORDER — FENTANYL CITRATE (PF) 100 MCG/2ML IJ SOLN
100.0000 ug | INTRAMUSCULAR | Status: DC | PRN
  Administered 2019-05-16: 100 ug via INTRAVENOUS

## 2019-05-16 MED ORDER — DEXAMETHASONE SODIUM PHOSPHATE 10 MG/ML IJ SOLN
INTRAMUSCULAR | Status: DC | PRN
  Administered 2019-05-16: 5 mg via INTRAVENOUS

## 2019-05-16 MED ORDER — CHLORHEXIDINE GLUCONATE 4 % EX LIQD: 60.0000 mL | Freq: Once | CUTANEOUS | Status: DC

## 2019-05-16 MED ORDER — CEFAZOLIN SODIUM-DEXTROSE 2-4 GM/100ML-% IV SOLN
INTRAVENOUS | Status: AC
  Filled 2019-05-16: qty 100

## 2019-05-16 MED ORDER — 0.9 % SODIUM CHLORIDE (POUR BTL) OPTIME
TOPICAL | Status: DC | PRN
  Administered 2019-05-16: 120 mL

## 2019-05-16 MED ORDER — ROPIVACAINE HCL 5 MG/ML IJ SOLN
INTRAMUSCULAR | Status: DC | PRN
  Administered 2019-05-16: 30 mL via PERINEURAL

## 2019-05-16 MED ORDER — BUPIVACAINE HCL (PF) 0.5 % IJ SOLN
INTRAMUSCULAR | Status: AC
  Filled 2019-05-16: qty 30

## 2019-05-16 MED ORDER — PROMETHAZINE HCL 25 MG/ML IJ SOLN: 6.2500 mg | INTRAMUSCULAR | Status: DC | PRN

## 2019-05-16 MED ORDER — HYDROCODONE-ACETAMINOPHEN 5-325 MG PO TABS: 1.0000 | ORAL_TABLET | Freq: Four times a day (QID) | ORAL | 0 refills | Status: DC | PRN

## 2019-05-16 MED ORDER — MIDAZOLAM HCL 2 MG/2ML IJ SOLN
INTRAMUSCULAR | Status: AC
  Filled 2019-05-16: qty 2

## 2019-05-16 MED ORDER — CEFAZOLIN SODIUM-DEXTROSE 2-4 GM/100ML-% IV SOLN
2.0000 g | INTRAVENOUS | Status: AC
  Administered 2019-05-16: 3 g via INTRAVENOUS

## 2019-05-16 MED ORDER — ONDANSETRON HCL 4 MG/2ML IJ SOLN
INTRAMUSCULAR | Status: DC | PRN
  Administered 2019-05-16: 4 mg via INTRAVENOUS

## 2019-05-16 MED ORDER — OXYCODONE HCL 5 MG PO TABS: 5.0000 mg | ORAL_TABLET | Freq: Once | ORAL | Status: DC | PRN

## 2019-05-16 MED ORDER — CEFAZOLIN SODIUM-DEXTROSE 1-4 GM/50ML-% IV SOLN
INTRAVENOUS | Status: AC
  Filled 2019-05-16: qty 50

## 2019-05-16 MED ORDER — LIDOCAINE 2% (20 MG/ML) 5 ML SYRINGE
INTRAMUSCULAR | Status: DC | PRN
  Administered 2019-05-16: 50 mg via INTRAVENOUS

## 2019-05-16 MED ORDER — FENTANYL CITRATE (PF) 100 MCG/2ML IJ SOLN
INTRAMUSCULAR | Status: AC
  Filled 2019-05-16: qty 2

## 2019-05-16 MED ORDER — VANCOMYCIN HCL 500 MG IV SOLR
INTRAVENOUS | Status: AC
  Filled 2019-05-16: qty 500

## 2019-05-16 MED ORDER — MIDAZOLAM HCL 2 MG/2ML IJ SOLN
1.0000 mg | INTRAMUSCULAR | Status: DC | PRN
  Administered 2019-05-16: 2 mg via INTRAVENOUS

## 2019-05-16 MED ORDER — LACTATED RINGERS IV SOLN: INTRAVENOUS | Status: DC

## 2019-05-16 MED ORDER — MEPERIDINE HCL 25 MG/ML IJ SOLN: 6.2500 mg | INTRAMUSCULAR | Status: DC | PRN

## 2019-05-16 MED ORDER — VANCOMYCIN HCL 500 MG IV SOLR
INTRAVENOUS | Status: DC | PRN
  Administered 2019-05-16: 500 mg via TOPICAL

## 2019-05-16 MED ORDER — EPHEDRINE SULFATE 50 MG/ML IJ SOLN
INTRAMUSCULAR | Status: DC | PRN
  Administered 2019-05-16: 15 mg via INTRAVENOUS

## 2019-05-16 MED ORDER — OXYCODONE HCL 5 MG/5ML PO SOLN: 5.0000 mg | Freq: Once | ORAL | Status: DC | PRN

## 2019-05-16 MED ORDER — LIDOCAINE-EPINEPHRINE 1 %-1:100000 IJ SOLN
INTRAMUSCULAR | Status: AC
  Filled 2019-05-16: qty 1

## 2019-05-16 MED ORDER — SODIUM CHLORIDE 0.9 % IV SOLN: INTRAVENOUS | Status: DC

## 2019-05-16 SURGICAL SUPPLY — 69 items
BLADE AVERAGE 25X9 (BLADE) ×1 IMPLANT
BLADE MICRO SAGITTAL (BLADE) IMPLANT
BLADE OSC/SAG .038X5.5 CUT EDG (BLADE) IMPLANT
BLADE SURG 10 STRL SS (BLADE) IMPLANT
BLADE SURG 15 STRL LF DISP TIS (BLADE) ×1 IMPLANT
BLADE SURG 15 STRL SS (BLADE) ×1
BNDG COHESIVE 4X5 TAN STRL (GAUZE/BANDAGES/DRESSINGS) ×2 IMPLANT
BNDG CONFORM 3 STRL LF (GAUZE/BANDAGES/DRESSINGS) IMPLANT
BNDG ESMARK 4X9 LF (GAUZE/BANDAGES/DRESSINGS) ×2 IMPLANT
CHLORAPREP W/TINT 26 (MISCELLANEOUS) ×2 IMPLANT
COVER BACK TABLE 60X90IN (DRAPES) ×2 IMPLANT
COVER WAND RF STERILE (DRAPES) IMPLANT
DECANTER SPIKE VIAL GLASS SM (MISCELLANEOUS) IMPLANT
DRAPE EXTREMITY T 121X128X90 (DISPOSABLE) ×2 IMPLANT
DRAPE OEC MINIVIEW 54X84 (DRAPES) IMPLANT
DRAPE SURG 17X23 STRL (DRAPES) ×1 IMPLANT
DRAPE U-SHAPE 47X51 STRL (DRAPES) ×1 IMPLANT
DRSG MEPITEL 4X7.2 (GAUZE/BANDAGES/DRESSINGS) ×3 IMPLANT
DRSG PAD ABDOMINAL 8X10 ST (GAUZE/BANDAGES/DRESSINGS) IMPLANT
ELECT REM PT RETURN 9FT ADLT (ELECTROSURGICAL) ×2
ELECTRODE REM PT RTRN 9FT ADLT (ELECTROSURGICAL) ×1 IMPLANT
GAUZE SPONGE 4X4 12PLY STRL (GAUZE/BANDAGES/DRESSINGS) ×2 IMPLANT
GLOVE BIO SURGEON STRL SZ8 (GLOVE) ×2 IMPLANT
GLOVE BIOGEL PI IND STRL 6.5 (GLOVE) IMPLANT
GLOVE BIOGEL PI IND STRL 7.0 (GLOVE) IMPLANT
GLOVE BIOGEL PI IND STRL 7.5 (GLOVE) IMPLANT
GLOVE BIOGEL PI IND STRL 8 (GLOVE) ×2 IMPLANT
GLOVE BIOGEL PI INDICATOR 6.5 (GLOVE) ×1
GLOVE BIOGEL PI INDICATOR 7.0 (GLOVE) ×1
GLOVE BIOGEL PI INDICATOR 7.5 (GLOVE) ×1
GLOVE BIOGEL PI INDICATOR 8 (GLOVE) ×2
GLOVE ECLIPSE 8.0 STRL XLNG CF (GLOVE) ×2 IMPLANT
GLOVE SURG SS PI 6.5 STRL IVOR (GLOVE) ×1 IMPLANT
GLOVE SURG SS PI 7.0 STRL IVOR (GLOVE) ×1 IMPLANT
GOWN STRL REUS W/ TWL LRG LVL3 (GOWN DISPOSABLE) ×1 IMPLANT
GOWN STRL REUS W/ TWL XL LVL3 (GOWN DISPOSABLE) ×2 IMPLANT
GOWN STRL REUS W/TWL LRG LVL3 (GOWN DISPOSABLE) ×1
GOWN STRL REUS W/TWL XL LVL3 (GOWN DISPOSABLE) ×3
NDL HYPO 25X1 1.5 SAFETY (NEEDLE) IMPLANT
NDL SAFETY ECLIPSE 18X1.5 (NEEDLE) IMPLANT
NEEDLE HYPO 18GX1.5 SHARP (NEEDLE)
NEEDLE HYPO 25X1 1.5 SAFETY (NEEDLE) IMPLANT
NS IRRIG 1000ML POUR BTL (IV SOLUTION) ×2 IMPLANT
PACK BASIN DAY SURGERY FS (CUSTOM PROCEDURE TRAY) ×2 IMPLANT
PAD CAST 4YDX4 CTTN HI CHSV (CAST SUPPLIES) ×1 IMPLANT
PADDING CAST ABS 4INX4YD NS (CAST SUPPLIES)
PADDING CAST ABS COTTON 4X4 ST (CAST SUPPLIES) IMPLANT
PADDING CAST COTTON 4X4 STRL (CAST SUPPLIES) ×1
PENCIL SMOKE EVACUATOR (MISCELLANEOUS) IMPLANT
SANITIZER HAND PURELL 535ML FO (MISCELLANEOUS) ×2 IMPLANT
SHEET MEDIUM DRAPE 40X70 STRL (DRAPES) ×2 IMPLANT
SLEEVE SCD COMPRESS KNEE MED (MISCELLANEOUS) ×2 IMPLANT
SPONGE LAP 18X18 RF (DISPOSABLE) ×2 IMPLANT
STOCKINETTE 6  STRL (DRAPES) ×1
STOCKINETTE 6 STRL (DRAPES) ×1 IMPLANT
SUCTION FRAZIER HANDLE 10FR (MISCELLANEOUS)
SUCTION TUBE FRAZIER 10FR DISP (MISCELLANEOUS) IMPLANT
SUT ETHILON 2 0 FS 18 (SUTURE) ×2 IMPLANT
SUT ETHILON 2 0 FSLX (SUTURE) IMPLANT
SUT ETHILON 3 0 PS 1 (SUTURE) IMPLANT
SUT MNCRL AB 3-0 PS2 18 (SUTURE) IMPLANT
SUT PDS AB 0 CT 36 (SUTURE) ×1 IMPLANT
SUT PDS AB 2-0 CT2 27 (SUTURE) ×1 IMPLANT
SWAB COLLECTION DEVICE MRSA (MISCELLANEOUS) IMPLANT
SYR BULB 3OZ (MISCELLANEOUS) ×2 IMPLANT
SYR CONTROL 10ML LL (SYRINGE) IMPLANT
TOWEL GREEN STERILE FF (TOWEL DISPOSABLE) ×2 IMPLANT
TUBE CONNECTING 20X1/4 (TUBING) IMPLANT
UNDERPAD 30X36 HEAVY ABSORB (UNDERPADS AND DIAPERS) ×2 IMPLANT

## 2019-05-16 NOTE — Discharge Instructions (Addendum)
Wylene Simmer, MD [facility]  Please read the following information regarding your care after surgery.  Medications  You only need a prescription for the narcotic pain medicine (ex. oxycodone, Percocet, Norco).  All of the other medicines listed below are available over the counter. Hydrocodone as needed for severe pain.  Weight Bearing ? Bear weight when you are able on your operated leg or foot. X Bear weight only on your operated foot in the post-op shoe. ? Do not bear any weight on the operated leg or foot.  Cast / Splint / Dressing X Keep your splint, cast or dressing clean and dry.  Don't put anything (coat hanger, pencil, etc) down inside of it.  If it gets damp, use a hair dryer on the cool setting to dry it.  If it gets soaked, call the office to schedule an appointment for a cast change. ? Remove your dressing 3 days after surgery and cover the incisions with dry dressings.    After your dressing, cast or splint is removed; you may shower, but do not soak or scrub the wound.  Allow the water to run over it, and then gently pat it dry.  Swelling It is normal for you to have swelling where you had surgery.  To reduce swelling and pain, keep your toes above your nose for at least 3 days after surgery.  It may be necessary to keep your foot or leg elevated for several weeks.  If it hurts, it should be elevated.  Follow Up Call my office at [phone] when you are discharged from the hospital or surgery center to schedule an appointment to be seen two weeks after surgery.  Call my office at [phone] if you develop a fever >101.5 F, nausea, vomiting, bleeding from the surgical site or severe pain.     Post Anesthesia Home Care Instructions  Activity: Get plenty of rest for the remainder of the day. A responsible individual must stay with you for 24 hours following the procedure.  For the next 24 hours, DO NOT: -Drive a car -Paediatric nurse -Drink alcoholic  beverages -Take any medication unless instructed by your physician -Make any legal decisions or sign important papers.  Meals: Start with liquid foods such as gelatin or soup. Progress to regular foods as tolerated. Avoid greasy, spicy, heavy foods. If nausea and/or vomiting occur, drink only clear liquids until the nausea and/or vomiting subsides. Call your physician if vomiting continues.  Special Instructions/Symptoms: Your throat may feel dry or sore from the anesthesia or the breathing tube placed in your throat during surgery. If this causes discomfort, gargle with warm salt water. The discomfort should disappear within 24 hours.  If you had a scopolamine patch placed behind your ear for the management of post- operative nausea and/or vomiting:  1. The medication in the patch is effective for 72 hours, after which it should be removed.  Wrap patch in a tissue and discard in the trash. Wash hands thoroughly with soap and water. 2. You may remove the patch earlier than 72 hours if you experience unpleasant side effects which may include dry mouth, dizziness or visual disturbances. 3. Avoid touching the patch. Wash your hands with soap and water after contact with the patch.    Regional Anesthesia Blocks  1. Numbness or the inability to move the "blocked" extremity may last from 3-48 hours after placement. The length of time depends on the medication injected and your individual response to the medication. If the numbness  is not going away after 48 hours, call your surgeon.  2. The extremity that is blocked will need to be protected until the numbness is gone and the  Strength has returned. Because you cannot feel it, you will need to take extra care to avoid injury. Because it may be weak, you may have difficulty moving it or using it. You may not know what position it is in without looking at it while the block is in effect.  3. For blocks in the legs and feet, returning to weight bearing  and walking needs to be done carefully. You will need to wait until the numbness is entirely gone and the strength has returned. You should be able to move your leg and foot normally before you try and bear weight or walk. You will need someone to be with you when you first try to ensure you do not fall and possibly risk injury.  4. Bruising and tenderness at the needle site are common side effects and will resolve in a few days.  5. Persistent numbness or new problems with movement should be communicated to the surgeon or the [facility] [phone] [facility] [phone]).

## 2019-05-16 NOTE — Anesthesia Preprocedure Evaluation (Signed)
Anesthesia Evaluation  Patient identified by MRN, date of birth, ID band Patient awake    Reviewed: Allergy & Precautions, NPO status , Patient's Chart, lab work & pertinent test results  Airway Mallampati: II  TM Distance: >3 FB Neck ROM: Full    Dental no notable dental hx.    Pulmonary neg pulmonary ROS,    Pulmonary exam normal breath sounds clear to auscultation       Cardiovascular negative cardio ROS Normal cardiovascular exam Rhythm:Regular Rate:Normal     Neuro/Psych negative neurological ROS  negative psych ROS   GI/Hepatic negative GI ROS, Neg liver ROS,   Endo/Other  negative endocrine ROSdiabetes, Poorly Controlled  Renal/GU negative Renal ROS  negative genitourinary   Musculoskeletal negative musculoskeletal ROS (+)   Abdominal (+) + obese,   Peds negative pediatric ROS (+)  Hematology negative hematology ROS (+)   Anesthesia Other Findings   Reproductive/Obstetrics negative OB ROS                             Anesthesia Physical Anesthesia Plan  ASA: III  Anesthesia Plan: General   Post-op Pain Management:  Regional for Post-op pain   Induction: Intravenous  PONV Risk Score and Plan: 2 and Ondansetron, Midazolam and Treatment may vary due to age or medical condition  Airway Management Planned: LMA  Additional Equipment:   Intra-op Plan:   Post-operative Plan: Extubation in OR  Informed Consent: I have reviewed the patients History and Physical, chart, labs and discussed the procedure including the risks, benefits and alternatives for the proposed anesthesia with the patient or authorized representative who has indicated his/her understanding and acceptance.     Dental advisory given  Plan Discussed with: CRNA  Anesthesia Plan Comments:         Anesthesia Quick Evaluation

## 2019-05-16 NOTE — Op Note (Signed)
05/16/2019  12:36 PM  PATIENT:  [name]  34 y.o. 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  Left fifth ray amputation   2.  Fillet flap of the left fifth toe with rotational coverage of the plantar wound   SURGEON:  Toni Arthurs, MD  ASSISTANT: None  ANESTHESIA:   General  EBL:  minimal   TOURNIQUET:   Total Tourniquet Time Documented: Calf (Left) - 12 minutes Total: Calf (Left) - 12 minutes  COMPLICATIONS:  None apparent  DISPOSITION:  Extubated, awake and stable to recovery.  INDICATION FOR PROCEDURE: The patient is a 34 year old male with a past medical history significant for type 2 diabetes.  He has a chronic left plantar forefoot ulcer with underlying osteomyelitis of the fifth metatarsal and an open left fifth MTP joint.  He has failed nonoperative treatment to date at the wound center.  He presents now for left fifth ray amputation.  The risks and benefits of the alternative treatment options have been discussed in detail.  The patient wishes to proceed with surgery and specifically understands risks of bleeding, infection, nerve damage, blood clots, need for additional surgery, amputation and death.  PROCEDURE IN DETAIL: After preoperative consent was obtained and the correct operative site was identified, the patient was brought the operating room and placed supine on the operating table.  Preoperative antibiotics were administered.  A surgical timeout was taken.  The left lower extremity was prepped and draped in standard sterile fashion.  The foot was exsanguinated and a 4 inch Esmarch tourniquet wrapped around the ankle.  The patient's plantar ulcer was identified beneath the fifth metatarsal head.  An incision was marked on the skin from the ulcer along the fifth metatarsal shaft proximally.  Distally the incision was made along the mid lateral border of the  lateral fifth toe.  The incision was made at the level of the toe down to bone.  Subperiosteal dissection was carried dorsally and plantarly to the phalanges.  The nail and distal phalanx were removed in their entirety.  This left a fillet flap of the remaining skin dorsally and plantarly at the fifth toe.  The proximal phalanx was disarticulated through the MTP joint and the middle and proximal phalanges were removed.  The plantar ulcer was excised in its entirety.  This left a plantar soft tissue defect of approximately 2 cm x 2 cm.  Subperiosteal dissection was then carried along the fifth metatarsal to the junction of the proximal and middle thirds.  The oscillating saw was used to cut through the fifth metatarsal shaft beveling the cuts appropriately.  The distal portion of the metatarsal was removed and passed off the field.  The remaining soft tissues appeared generally healthy.  The wound was irrigated copiously.  The tourniquet was released.  Hemostasis was achieved.  Vancomycin powder was placed in the wound.  Deep subcutaneous tissues were approximated with 0 PDS.  The fillet flap from the fifth toe was rotated down into the plantar soft tissue defect filling it appropriately.  The flap was sutured in place with horizontal mattress sutures of 2-0 nylon.  Superficial subcutaneous tissues were approximated with 2-0 PDS.  The proximal end of the skin incision was closed with horizontal mattress sutures of 2-0 nylon.  Sterile dressings were applied followed by a well-padded compression dressing.  The patient was awakened from anesthesia and transported to the recovery room in  stable condition.  FOLLOW UP PLAN: Weightbearing as tolerated on the heel in a flat postop shoe.  Follow-up in the office in 2 weeks for suture removal as allowed by the condition of the wound.

## 2019-05-16 NOTE — Transfer of Care (Signed)
Immediate Anesthesia Transfer of Care Note  Patient: [name] Gottschall  Procedure(s) Performed: Left 5th ray amputation (Left Fifth Toe)  Patient Location: PACU  Anesthesia Type:GA combined with regional for post-op pain  Level of Consciousness: awake, alert , oriented and patient cooperative  Airway & Oxygen Therapy: Patient Spontanous Breathing and Patient connected to face mask oxygen  Post-op Assessment: Report given to RN and Post -op Vital signs reviewed and stable  Post vital signs: Reviewed and stable  Last Vitals:  Vitals Value Taken Time  BP    Temp    Pulse 99 05/16/19 1230  Resp 18 05/16/19 1230  SpO2 99 % 05/16/19 1230    Last Pain:  Vitals:   05/16/19 0856  TempSrc: Oral  PainSc: 0-No pain      Patients Stated Pain Goal: 3 (05/16/19 0856)  Complications: No apparent anesthesia complications

## 2019-05-16 NOTE — Anesthesia Postprocedure Evaluation (Signed)
Anesthesia Post Note  Patient: [name]  Procedure(s) Performed: Left 5th ray amputation (Left Fifth Toe)     Patient location during evaluation: PACU Anesthesia Type: General Level of consciousness: awake and alert Pain management: pain level controlled Vital Signs Assessment: post-procedure vital signs reviewed and stable Respiratory status: spontaneous breathing, nonlabored ventilation and respiratory function stable Cardiovascular status: blood pressure returned to baseline and stable Postop Assessment: no apparent nausea or vomiting Anesthetic complications: no    Last Vitals:  Vitals:   05/16/19 1300 05/16/19 1330  BP: (!) 167/102 (!) 172/98  Pulse: 87 91  Resp: 14 16  Temp:  36.6 C  SpO2: 100% 98%    Last Pain:  Vitals:   05/16/19 1330  TempSrc:   PainSc: 0-No pain                 Lowella Curb

## 2019-05-16 NOTE — Anesthesia Procedure Notes (Signed)
Procedure Name: LMA Insertion Date/Time: 05/16/2019 11:29 AM Performed by: Sheryn Bison, CRNA Pre-anesthesia Checklist: Patient identified, Emergency Drugs available, Suction available and Patient being monitored Patient Re-evaluated:Patient Re-evaluated prior to induction Oxygen Delivery Method: Circle system utilized Preoxygenation: Pre-oxygenation with 100% oxygen Induction Type: IV induction Ventilation: Mask ventilation without difficulty LMA: LMA inserted LMA Size: 5.0 Number of attempts: 1 Airway Equipment and Method: Bite block Placement Confirmation: positive ETCO2 Tube secured with: Tape Dental Injury: Teeth and Oropharynx as per pre-operative assessment

## 2019-05-16 NOTE — H&P (Signed)
[name] is an 34 y.o. male.   Chief Complaint: left foot ulcer HPI: [name] is a 34 year old male with a past medical history significant for type 2 diabetes.  He has a chronic left plantar foot diabetic ulcer that has failed extensive wound care.  The metatarsal head is exposed and the joint is open.  He has osteomyelitis of the distal portion of the fifth metatarsal.  He presents now for left fifth ray amputation.  Past Medical History:  Diagnosis Date  . Diabetes mellitus     Past Surgical History:  Procedure Laterality Date  . NO PAST SURGERIES      Family History  Problem Relation Age of Onset  . Hypertension Other   . Diabetes Other   . Healthy Mother    Social History:  reports that he has never smoked. He has never used smokeless tobacco. He reports that he does not drink alcohol or use drugs.  Allergies:  Allergies  Allergen Reactions  . Basaglar Cla[place]orne Rigg [Insulin Glargine] Diarrhea  . Metformin And Related Diarrhea and Nausea And Vomiting  . Trulicity [Dulaglutide] Diarrhea    Medications Prior to Admission  Medication Sig Dispense Refill  . insulin aspart (NOVOLOG) 100 UNIT/ML injection Inject 0-15 Units into the skin 3 (three) times daily with meals. (Patient taking differently: Inject 7-12 Units into the skin 3 (three) times daily as needed (before meals, if BGL is 250 or greater). ) 10 mL 1  . Insulin Glargine (LANTUS SOLOSTAR) 100 UNIT/ML Solostar Pen Inject 40 Units into the skin daily before breakfast.    . sitaGLIPtin (JANUVIA) 50 MG tablet Take 50 mg by mouth daily.    Marland Kitchen acetaminophen (TYLENOL) 500 MG tablet Take 500-1,000 mg by mouth every 6 (six) hours as needed for mild pain, fever or headache.     . Continuous Blood Gluc Sensor (FREESTYLE LIBRE 14 DAY SENSOR) MISC Inject 1 patch into the skin every 14 (fourteen) days.    . Continuous Blood Gluc Sensor MISC 1 each by Does not apply route as directed. Use as directed every 14 days. May dispense  FreeStyle [employer] or similar. 2 each 0    Results for orders placed or performed during the hospital encounter of 05/16/19 (from the past 48 hour(s))  Glucose, capillary     Status: Abnormal   Collection Time: 05/16/19  9:10 AM  Result Value Ref Range   Glucose-Capillary 152 (H) 70 - 99 mg/dL   No results found.  Review of Systems no recent fever, chills, nausea, vomiting or changes in his appetite  Blood pressure 110/62, pulse 64, temperature 97.7 F (36.5 C), temperature source Oral, resp. rate (!) 6, height 6\' 5"  (1.956 m), weight 129.2 kg, SpO2 100 %. Physical Exam  Well-nourished well-developed male in no apparent distress.  Alert and oriented x4.  Mood and affect are normal.  Extraocular motions are intact.  Respirations are unlabored.  Gait is flatfoot flatfoot on the left in a postop shoe.  Left foot has a plantar ulcer beneath the fifth metatarsal head.  The head is exposed with erosion of the cortical bone.  The MTP joint is open.  No cellulitis is evident.  Sensibility to light touch in the foot is diminished.  5 out of 5 strength in plantarflexion and dorsiflexion of the toes.  Brisk capillary refill at the toes.  Assessment/Plan Chronic left foot diabetic ulcer with osteomyelitis of the fifth metatarsal -to the operating room today for left fifth ray amputation.  The risks and benefits of the alternative treatment options have been discussed in detail.  The patient wishes to proceed with surgery and specifically understands risks of bleeding, infection, nerve damage, blood clots, need for additional surgery, amputation and death.   Toni Arthurs, MD 06/13/2019, 11:03 AM

## 2019-05-16 NOTE — Progress Notes (Signed)
Assisted Dr. Rose with left, ultrasound guided, popliteal block. Side rails up, monitors on throughout procedure. See vital signs in flow sheet. Tolerated Procedure well. 

## 2019-05-16 NOTE — Anesthesia Procedure Notes (Signed)
Anesthesia Regional Block: Popliteal block   Pre-Anesthetic Checklist: ,, timeout performed, Correct Patient, Correct Site, Correct Laterality, Correct Procedure, Correct Position, site marked, Risks and benefits discussed,  Surgical consent,  Pre-op evaluation,  At surgeon's request and post-op pain management  Laterality: Left  Prep: chloraprep       Needles:  Injection technique: Single-shot  Needle Type: Stimiplex     Needle Length: 9cm  Needle Gauge: 21     Additional Needles:   Procedures:,,,, ultrasound used (permanent image in chart),,,,  Narrative:  Start time: 05/16/2019 10:25 AM End time: 05/16/2019 10:30 AM Injection made incrementally with aspirations every 5 mL.  Performed by: Personally  Anesthesiologist: Lowella Curb, MD

## 2019-05-17 ENCOUNTER — Encounter: Payer: Self-pay | Admitting: *Deleted

## 2019-07-04 ENCOUNTER — Encounter: Payer: Self-pay | Admitting: Ophthalmology

## 2019-07-18 ENCOUNTER — Encounter (INDEPENDENT_AMBULATORY_CARE_PROVIDER_SITE_OTHER): Payer: Self-pay | Admitting: Ophthalmology

## 2019-07-18 ENCOUNTER — Ambulatory Visit (INDEPENDENT_AMBULATORY_CARE_PROVIDER_SITE_OTHER): Payer: BC Managed Care – PPO | Admitting: Ophthalmology

## 2019-07-18 ENCOUNTER — Other Ambulatory Visit: Payer: Self-pay

## 2019-07-18 DIAGNOSIS — E103411 Type 1 diabetes mellitus with severe nonproliferative diabetic retinopathy with macular edema, right eye: Secondary | ICD-10-CM | POA: Diagnosis not present

## 2019-07-18 DIAGNOSIS — E113412 Type 2 diabetes mellitus with severe nonproliferative diabetic retinopathy with macular edema, left eye: Secondary | ICD-10-CM | POA: Insufficient documentation

## 2019-07-18 DIAGNOSIS — E1136 Type 2 diabetes mellitus with diabetic cataract: Secondary | ICD-10-CM | POA: Insufficient documentation

## 2019-07-18 DIAGNOSIS — E103412 Type 1 diabetes mellitus with severe nonproliferative diabetic retinopathy with macular edema, left eye: Secondary | ICD-10-CM | POA: Diagnosis not present

## 2019-07-18 MED ORDER — FLUORESCEIN SODIUM 10 % IV SOLN
500.0000 mg | INTRAVENOUS | Status: AC | PRN
  Administered 2019-07-18: 500 mg via INTRAVENOUS

## 2019-07-18 NOTE — Progress Notes (Signed)
07/18/2019     CHIEF COMPLAINT Patient presents for Diabetic Eye Exam and Retina Follow Up   HISTORY OF PRESENT ILLNESS: [name] is a 34 y.o. male who presents to the clinic today for:   HPI    Diabetic Eye Exam    Vision is stable.  Associated Symptoms Negative for Flashes and Floaters.  Diabetes characteristics include Type 2.  Blood sugar level is controlled.  Last Blood Glucose 173.          Retina Follow Up    Patient presents with  Diabetic Retinopathy.  In both eyes.  Severity is moderate.  Since onset it is stable.  I, the attending physician,  performed the HPI with the patient and updated documentation appropriately.          Comments    Pt referred by Dr. Lucretia Roers for Retia Eval.  Pt is type 2 diabetic. Pt states vision is stable. Denies FOL and floaters.  BGL: 173 currently A1C: unknown       Last edited by Elyse Jarvis on 07/18/2019  8:26 AM. (History)      Referring physician: [name], MD [address] [place],  [place] [address]  HISTORICAL INFORMATION:   Selected notes from the MEDICAL RECORD NUMBER    Lab Results  Component Value Date   HGBA1C 9.9 (H) 02/03/2019     CURRENT MEDICATIONS: No current outpatient medications on file. (Ophthalmic Drugs)   No current facility-administered medications for this visit. (Ophthalmic Drugs)   Current Outpatient Medications (Other)  Medication Sig  . acetaminophen (TYLENOL) 500 MG tablet Take 500-1,000 mg by mouth every 6 (six) hours as needed for mild pain, fever or headache.   . Continuous Blood Gluc Sensor (FREESTYLE LIBRE 14 DAY SENSOR) MISC Inject 1 patch into the skin every 14 (fourteen) days.  . Continuous Blood Gluc Sensor MISC 1 each by Does not apply route as directed. Use as directed every 14 days. May dispense FreeStyle [employer] or similar.  . HYDROcodone-acetaminophen (NORCO) 5-325 MG tablet Take 1 tablet by mouth every 6 (six) hours as needed for severe  pain.  Marland Kitchen insulin aspart (NOVOLOG) 100 UNIT/ML injection Inject 0-15 Units into the skin 3 (three) times daily with meals. (Patient taking differently: Inject 7-12 Units into the skin 3 (three) times daily as needed (before meals, if BGL is 250 or greater). )  . Insulin Glargine (LANTUS SOLOSTAR) 100 UNIT/ML Solostar Pen Inject 40 Units into the skin daily before breakfast.  . sitaGLIPtin (JANUVIA) 50 MG tablet Take 50 mg by mouth daily.   No current facility-administered medications for this visit. (Other)      REVIEW OF SYSTEMS: ROS    Positive for: Endocrine   Last edited by Elyse Jarvis on 07/18/2019  8:26 AM. (History)       ALLERGIES Allergies  Allergen Reactions  . Basaglar Stephanie Coup [Insulin Glargine] Diarrhea  . Metformin And Related Diarrhea and Nausea And Vomiting  . Trulicity [Dulaglutide] Diarrhea    PAST MEDICAL HISTORY Past Medical History:  Diagnosis Date  . Diabetes mellitus    Past Surgical History:  Procedure Laterality Date  . 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 NO PAST SURGERIES      FAMILY HISTORY Family History  Problem Relation Age of Onset  . Hypertension Other   .  Diabetes Other   . Healthy Mother     SOCIAL HISTORY Social History   Tobacco Use  . Smoking status: Never Smoker  . Smokeless tobacco: Never Used  Substance Use Topics  . Alcohol use: No  . Drug use: No         OPHTHALMIC EXAM:  Base Eye Exam    Visual Acuity (Snellen - Linear)      Right Left   Dist [place] 20/100 -2 20/200   Dist ph [place] 20/25 20/30 +       Tonometry (Tonopen, 8:36 AM)      Right Left   Pressure 17 15       Pupils      Pupils Dark Light Shape React APD   Right PERRL 4 3 Round Slow None   Left PERRL 4 3 Round Slow None       Visual Fields (Counting fingers)      Left Right    Full Full        Neuro/Psych    Oriented x3: Yes   Mood/Affect: Normal       Dilation    Both eyes: 1.0% Mydriacyl, 2.5% Phenylephrine @ 8:36 AM        Slit Lamp and Fundus Exam    External Exam      Right Left   External Normal Normal       Slit Lamp Exam      Right Left   Lids/Lashes Normal Normal   Conjunctiva/Sclera White and quiet White and quiet   Cornea Clear Clear   Anterior Chamber Deep and quiet Deep and quiet   Iris Round and reactive Round and reactive   Lens 1+ Nuclear sclerosis 1+ Nuclear sclerosis, sub anterior capsule opacity   Anterior Vitreous Normal Normal       Fundus Exam      Right Left   Posterior Vitreous Normal Normal   Disc Normal Normal   C/D Ratio 0.5 0.5   Macula Exudates, Microaneurysms, Clinically significant macular edema, Mild clinically significant macular edema Normal   Vessels Severe nonproliferative diabetic retinopathy Severe nonproliferative diabetic retinopathy   Periphery Normal Normal          IMAGING AND PROCEDURES  Imaging and Procedures for @TODAY @  Fluorescein Angiography Optos (Transit OD)       Injection:  500 mg Fluorescein Sodium 10 % injection   NDC:   Route: Intravenous, Site: Right ArmRight Eye   Early phase findings include leakage. Mid/Late phase findings include leakage, microaneurysm.   Left Eye   Early phase findings include leakage. Mid/Late phase findings include leakage, microaneurysm.   Notes Clinically significant macular edema is angiographically present in the right eye.  Severe nonproliferative diabetic retinopathy is confirmed although there is an absence of capillary nonperfusion.  Thus right eye does not require peripheral panretinal photocoagulation.  The right eye should have a brief course of antiveg F to halt macular edema.  Clinically significant macular edema also present in the left eye more extensive in nature and should require antiveg F of a brief clinical course.       Color  Fundus Photography Optos - OU - Both Eyes       Right Eye Progression has no prior data. Disc findings include normal observations. Vessels : IRMA. Periphery : hemorrhage.   Left Eye Progression has no prior data. Disc findings include normal observations. Vessels : IRMA. Periphery : hemorrhage.   Notes Severe nonproliferative diabetic retinopathy with  clinically significant macular edema.                ASSESSMENT/PLAN:  Severe nonproliferative diabetic retinopathy of right eye, with macular edema, associated with type 1 diabetes mellitus ([place])  The nature of diabetic macular edema was discussed with the patient. Treatment options were outlined including medical therapy, laser & vitrectomy. The use of injectable medications reviewed, including Avastin, Lucentis, and Eylea. Periodic injections into the eye are likely to resolve diabetic macular edema (swelling in the center of vision). Initially, injections are delivered are delivered every 4-6 weeks, and the interval extended as the condition improves. On average, 8-9 injections the first year, and 5 in year 2. Improvement in the condition most often improves on medical therapy. Occasional use of focal laser is also recommended for residual macular edema (swelling). Excellent control of blood glucose and blood pressure are encouraged under the care of a primary physician or endocrinologist. Similarly, attempts to maintain serum cholesterol, low density lipoproteins, and high-density lipoproteins in a favorable range were recommended.       ICD-10-CM   1. Severe nonproliferative diabetic retinopathy of left eye, with macular edema, associated with type 1 diabetes mellitus (HCC)  N47.0962 Fluorescein Angiography Optos (Transit OD)    Fluorescein Sodium 10 % injection 500 mg    Color Fundus Photography Optos - OU - Both Eyes  2. Severe nonproliferative diabetic retinopathy of right eye, with macular edema, associated with type 1 diabetes  mellitus (HCC)  E10.3411 Color Fundus Photography Optos - OU - Both Eyes    1.  2.  3.  Ophthalmic Meds Ordered this visit:  Meds ordered this encounter  Medications  . Fluorescein Sodium 10 % injection 500 mg       Return in about 2 weeks (around 08/01/2019) for EYLEA OCT, OS, no exam next.  Patient Instructions  The nature of diabetic retinopathy was explained using the following analogy: "Retinopathy develops in the body's blood supply like salty water corrodes the linings of pipes in a house, until rust appears, then holes in the pipes develop which leak followed by destruction and loss of the pipes as the corrosion turns them to dust. In a similar fashion, Diabetes damages the blood supply of the body by cumulative long--term elevated blood sugar, which corrodes the blood supply in the body, particularly the blood vessels supplying the retina, kidneys, and nerves".  Thus, control of blood sugar, slows the progression of the corrosive effect of diabetes mellitus.       Explained the diagnoses, plan, and follow up with the patient and they expressed understanding.  Patient expressed understanding of the importance of proper follow up care.   Clent Demark Chantz Montefusco M.D. Diseases & Surgery of the Retina and Vitreous [facility] @TODAY @     Abbreviations: M myopia (nearsighted); A astigmatism; H hyperopia (farsighted); P presbyopia; Mrx spectacle prescription;  CTL contact lenses; OD right eye; OS left eye; OU both eyes  XT exotropia; ET esotropia; PEK punctate epithelial keratitis; PEE punctate epithelial erosions; DES dry eye syndrome; MGD meibomian gland dysfunction; ATs artificial tears; PFAT's preservative free artificial tears; [place] nuclear sclerotic cataract; PSC posterior subcapsular cataract; ERM epi-retinal membrane; PVD posterior vitreous detachment; RD retinal detachment; DM diabetes mellitus; DR diabetic retinopathy; NPDR non-proliferative diabetic  retinopathy; PDR proliferative diabetic retinopathy; CSME clinically significant macular edema; DME diabetic macular edema; dbh dot blot hemorrhages; CWS cotton wool spot; POAG primary open angle glaucoma; C/D cup-to-disc ratio; HVF humphrey visual field;  GVF goldmann visual field; OCT optical coherence tomography; IOP intraocular pressure; BRVO Branch retinal vein occlusion; CRVO central retinal vein occlusion; CRAO central retinal artery occlusion; BRAO branch retinal artery occlusion; RT retinal tear; SB scleral buckle; PPV pars plana vitrectomy; VH Vitreous hemorrhage; PRP panretinal laser photocoagulation; IVK intravitreal kenalog; VMT vitreomacular traction; MH Macular hole;  NVD neovascularization of the disc; NVE neovascularization elsewhere; AREDS age related eye disease study; ARMD age related macular degeneration; POAG primary open angle glaucoma; EBMD epithelial/anterior basement membrane dystrophy; ACIOL anterior chamber intraocular lens; IOL intraocular lens; PCIOL posterior chamber intraocular lens; Phaco/IOL phacoemulsification with intraocular lens placement; [place] photorefractive keratectomy; LASIK laser assisted in situ keratomileusis; HTN hypertension; DM diabetes mellitus; COPD chronic obstructive pulmonary disease

## 2019-07-18 NOTE — Assessment & Plan Note (Signed)
The nature of diabetic macular edema was discussed with the patient. Treatment options were outlined including medical therapy, laser & vitrectomy. The use of injectable medications reviewed, including Avastin, Lucentis, and Eylea. Periodic injections into the eye are likely to resolve diabetic macular edema (swelling in the center of vision). Initially, injections are delivered are delivered every 4-6 weeks, and the interval extended as the condition improves. On average, 8-9 injections the first year, and 5 in year 2. Improvement in the condition most often improves on medical therapy. Occasional use of focal laser is also recommended for residual macular edema (swelling). Excellent control of blood glucose and blood pressure are encouraged under the care of a primary physician or endocrinologist. Similarly, attempts to maintain serum cholesterol, low density lipoproteins, and high-density lipoproteins in a favorable range were recommended.  

## 2019-07-18 NOTE — Patient Instructions (Signed)
The nature of diabetic retinopathy was explained using the following analogy: "Retinopathy develops in the body's blood supply like salty water corrodes the linings of pipes in a house, until rust appears, then holes in the pipes develop which leak followed by destruction and loss of the pipes as the corrosion turns them to dust. In a similar fashion, Diabetes damages the blood supply of the body by cumulative long--term elevated blood sugar, which corrodes the blood supply in the body, particularly the blood vessels supplying the retina, kidneys, and nerves".  Thus, control of blood sugar, slows the progression of the corrosive effect of diabetes mellitus.    

## 2019-08-01 ENCOUNTER — Encounter (INDEPENDENT_AMBULATORY_CARE_PROVIDER_SITE_OTHER): Payer: Self-pay | Admitting: Ophthalmology

## 2019-08-01 ENCOUNTER — Other Ambulatory Visit: Payer: Self-pay

## 2019-08-01 ENCOUNTER — Ambulatory Visit (INDEPENDENT_AMBULATORY_CARE_PROVIDER_SITE_OTHER): Payer: BC Managed Care – PPO | Admitting: Ophthalmology

## 2019-08-01 DIAGNOSIS — E103412 Type 1 diabetes mellitus with severe nonproliferative diabetic retinopathy with macular edema, left eye: Secondary | ICD-10-CM | POA: Diagnosis not present

## 2019-08-01 MED ORDER — AFLIBERCEPT 2MG/0.05ML IZ SOLN FOR KALEIDOSCOPE
2.0000 mg | INTRAVITREAL | Status: AC | PRN
  Administered 2019-08-01: 2 mg via INTRAVITREAL

## 2019-08-01 NOTE — Progress Notes (Signed)
08/01/2019     CHIEF COMPLAINT Patient presents for Retina Follow Up   HISTORY OF PRESENT ILLNESS: [name] is a 34 y.o. male who presents to the clinic today for:   HPI    Retina Follow Up    Patient presents with  Diabetic Retinopathy.  In left eye.  Duration of 2 weeks.  Since onset it is stable.          Comments    2 week follow up- OCT OU and Possible Eylea OS - no dilation Patient denies change in vision and overall has no complaints. LBS 130 this AM A1C 'been almost 2 years'       Last edited by Gerda Diss on 08/01/2019  9:44 AM. (History)      Referring physician: [name], MD [address] [place],  [place] [address]  HISTORICAL INFORMATION:   Selected notes from the MEDICAL RECORD NUMBER    Lab Results  Component Value Date   HGBA1C 9.9 (H) 02/03/2019     CURRENT MEDICATIONS: No current outpatient medications on file. (Ophthalmic Drugs)   No current facility-administered medications for this visit. (Ophthalmic Drugs)   Current Outpatient Medications (Other)  Medication Sig  . acetaminophen (TYLENOL) 500 MG tablet Take 500-1,000 mg by mouth every 6 (six) hours as needed for mild pain, fever or headache.   . Continuous Blood Gluc Sensor (FREESTYLE LIBRE 14 DAY SENSOR) MISC Inject 1 patch into the skin every 14 (fourteen) days.  . Continuous Blood Gluc Sensor MISC 1 each by Does not apply route as directed. Use as directed every 14 days. May dispense FreeStyle [employer] or similar.  . HYDROcodone-acetaminophen (NORCO) 5-325 MG tablet Take 1 tablet by mouth every 6 (six) hours as needed for severe pain.  Marland Kitchen insulin aspart (NOVOLOG) 100 UNIT/ML injection Inject 0-15 Units into the skin 3 (three) times daily with meals. (Patient taking differently: Inject 7-12 Units into the skin 3 (three) times daily as needed (before meals, if BGL is 250 or greater). )  . Insulin Glargine (LANTUS SOLOSTAR) 100 UNIT/ML Solostar Pen Inject  40 Units into the skin daily before breakfast.  . sitaGLIPtin (JANUVIA) 50 MG tablet Take 50 mg by mouth daily.   No current facility-administered medications for this visit. (Other)      REVIEW OF SYSTEMS:    ALLERGIES Allergies  Allergen Reactions  . Basaglar Claiborne Rigg [Insulin Glargine] Diarrhea  . Metformin And Related Diarrhea and Nausea And Vomiting  . Trulicity [Dulaglutide] Diarrhea    PAST MEDICAL HISTORY Past Medical History:  Diagnosis Date  . Diabetes mellitus    Past Surgical History:  Procedure Laterality Date  . 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 NO PAST SURGERIES      FAMILY HISTORY Family History  Problem Relation Age of Onset  . Hypertension Other   . Diabetes Other   . Healthy Mother     SOCIAL HISTORY Social History   Tobacco Use  . Smoking status: Never Smoker  . Smokeless tobacco: Never Used  Substance Use Topics  . Alcohol use: No  . Drug use: No         OPHTHALMIC EXAM:  Base Eye Exam    Visual Acuity (Snellen - Linear)      Right Left   Dist [place] 20/30+2 20/40-1   Dist ph  [place] 20/25-1 20/30-2       Tonometry (Tonopen, 9:50 AM)      Right Left   Pressure 12 13       Neuro/Psych    Oriented x3: Yes   Mood/Affect: Normal        Slit Lamp and Fundus Exam    External Exam      Right Left   External Normal Normal       Slit Lamp Exam      Right Left   Lids/Lashes Normal Normal   Conjunctiva/Sclera White and quiet White and quiet   Cornea Clear Clear   Anterior Chamber Deep and quiet Deep and quiet   Iris Round and reactive Round and reactive   Vitreous Normal Normal          IMAGING AND PROCEDURES  Imaging and Procedures for @TODAY @  OCT, Retina - OU - Both Eyes       Right Eye Quality was good. Scan locations included subfoveal. Central Foveal Thickness: 249.  Progression has been stable. Findings include normal observations.   Left Eye Quality was good. Scan locations included subfoveal. Central Foveal Thickness: 284. Progression has been stable. Findings include abnormal foveal contour.   Notes (, Clinically significant macular edema with severe nonproliferative diabetic retinopathy.  Will return for follow-up visit in 7 weeks for examination possible Eylea       Intravitreal Injection, Pharmacologic Agent - OS - Left Eye       Time Out 08/01/2019. 10:02 AM. Confirmed correct patient, procedure, site, and patient consented.   Anesthesia Topical anesthesia was used. Anesthetic medications included [place] 3.5%.   Procedure Preparation included Ofloxacin , 5% betadine to ocular surface, 10% betadine to eyelids. A 30 gauge needle was used.   Injection:  2 mg aflibercept 08/03/2019) SOLN   NDC: Gretta Cool, Lot: [id number]   Route: Intravitreal, Site: Left Eye, Waste: 0 mg  Post-op Post injection exam found visual acuity of at least counting fingers. The patient tolerated the procedure well. There were no complications. The patient received written and verbal post procedure care education. Post injection medications were not given.                 ASSESSMENT/PLAN:  No problem-specific Assessment & Plan notes found for this encounter.      ICD-10-CM   1. Severe nonproliferative diabetic retinopathy of left eye, with macular edema, associated with type 1 diabetes mellitus (HCC)  E10.3412 OCT, Retina - OU - Both Eyes    Intravitreal Injection, Pharmacologic Agent - OS - Left Eye    aflibercept (EYLEA) SOLN 2 mg    1.(, Clinically significant macular edema with severe nonproliferative diabetic retinopathy OS.  Will return for follow-up visit in 7 weeks for examination possible Eylea  2.  3.  Ophthalmic Meds Ordered this visit:  Meds ordered this encounter  Medications  . aflibercept (EYLEA) SOLN 2 mg       No follow-ups  on file.  There are no Patient Instructions on file for this visit.   Explained the diagnoses, plan, and follow up with the patient and they expressed understanding.  Patient expressed understanding of the importance of proper follow up care.   10-16-1990 Kymorah Korf M.D. Diseases & Surgery of the Retina and Vitreous [facility] @TODAY @     Abbreviations: M myopia (nearsighted); A astigmatism; H hyperopia (farsighted); P presbyopia; Mrx spectacle prescription;  CTL contact lenses; OD right eye; OS left eye; OU both  eyes  XT exotropia; ET esotropia; PEK punctate epithelial keratitis; PEE punctate epithelial erosions; DES dry eye syndrome; MGD meibomian gland dysfunction; ATs artificial tears; PFAT's preservative free artificial tears; NSC nuclear sclerotic cataract; PSC posterior subcapsular cataract; ERM epi-retinal membrane; PVD posterior vitreous detachment; RD retinal detachment; DM diabetes mellitus; DR diabetic retinopathy; NPDR non-proliferative diabetic retinopathy; PDR proliferative diabetic retinopathy; CSME clinically significant macular edema; DME diabetic macular edema; dbh dot blot hemorrhages; CWS cotton wool spot; POAG primary open angle glaucoma; C/D cup-to-disc ratio; HVF humphrey visual field; GVF goldmann visual field; OCT optical coherence tomography; IOP intraocular pressure; BRVO Branch retinal vein occlusion; CRVO central retinal vein occlusion; CRAO central retinal artery occlusion; BRAO branch retinal artery occlusion; RT retinal tear; SB scleral buckle; PPV pars plana vitrectomy; VH Vitreous hemorrhage; PRP panretinal laser photocoagulation; IVK intravitreal kenalog; VMT vitreomacular traction; MH Macular hole;  NVD neovascularization of the disc; NVE neovascularization elsewhere; AREDS age related eye disease study; ARMD age related macular degeneration; POAG primary open angle glaucoma; EBMD epithelial/anterior basement membrane dystrophy; ACIOL anterior chamber  intraocular lens; IOL intraocular lens; PCIOL posterior chamber intraocular lens; Phaco/IOL phacoemulsification with intraocular lens placement; PRK photorefractive keratectomy; LASIK laser assisted in situ keratomileusis; HTN hypertension; DM diabetes mellitus; COPD chronic obstructive pulmonary disease

## 2019-08-17 ENCOUNTER — Encounter (HOSPITAL_COMMUNITY): Payer: Self-pay | Admitting: Emergency Medicine

## 2019-08-17 ENCOUNTER — Emergency Department (HOSPITAL_COMMUNITY)
Admission: EM | Admit: 2019-08-17 | Discharge: 2019-08-17 | Disposition: A | Discharge status: Home / Self Care | Payer: BC Managed Care – PPO | Attending: Emergency Medicine | Admitting: Emergency Medicine

## 2019-08-17 ENCOUNTER — Other Ambulatory Visit: Payer: Self-pay

## 2019-08-17 DIAGNOSIS — Z7901 Long term (current) use of anticoagulants: Secondary | ICD-10-CM | POA: Insufficient documentation

## 2019-08-17 DIAGNOSIS — G43009 Migraine without aura, not intractable, without status migrainosus: Secondary | ICD-10-CM

## 2019-08-17 DIAGNOSIS — R519 Headache, unspecified: Secondary | ICD-10-CM | POA: Diagnosis present

## 2019-08-17 DIAGNOSIS — E103413 Type 1 diabetes mellitus with severe nonproliferative diabetic retinopathy with macular edema, bilateral: Secondary | ICD-10-CM | POA: Diagnosis not present

## 2019-08-17 DIAGNOSIS — Z79899 Other long term (current) drug therapy: Secondary | ICD-10-CM | POA: Insufficient documentation

## 2019-08-17 DIAGNOSIS — E109 Type 1 diabetes mellitus without complications: Secondary | ICD-10-CM | POA: Diagnosis not present

## 2019-08-17 LAB — CBG MONITORING, ED: Glucose-Capillary: 283 mg/dL — ABNORMAL HIGH (ref 70–99)

## 2019-08-17 MED ORDER — DIPHENHYDRAMINE HCL 50 MG/ML IJ SOLN
25.0000 mg | Freq: Once | INTRAMUSCULAR | Status: AC
  Administered 2019-08-17: 25 mg via INTRAVENOUS
  Filled 2019-08-17: qty 1

## 2019-08-17 MED ORDER — PROCHLORPERAZINE EDISYLATE 10 MG/2ML IJ SOLN
10.0000 mg | Freq: Once | INTRAMUSCULAR | Status: AC
  Administered 2019-08-17: 10 mg via INTRAVENOUS
  Filled 2019-08-17: qty 2

## 2019-08-17 MED ORDER — KETOROLAC TROMETHAMINE 15 MG/ML IJ SOLN
15.0000 mg | Freq: Once | INTRAMUSCULAR | Status: AC
  Administered 2019-08-17: 15 mg via INTRAVENOUS
  Filled 2019-08-17: qty 1

## 2019-08-17 MED ORDER — SODIUM CHLORIDE 0.9 % IV BOLUS
1000.0000 mL | Freq: Once | INTRAVENOUS | Status: AC
  Administered 2019-08-17: 1000 mL via INTRAVENOUS

## 2019-08-17 NOTE — ED Provider Notes (Signed)
[facility] Provider Note   CSN: [id number] Arrival date & time: 08/17/19  0703     History Chief Complaint  Patient presents with  . Headache    [name] is a 34 y.o. male with past medical history significant for migraines, diabetic neuropathy, osteomyelitis of left foot who presents for evaluation of headache. Patient states he has had a headache since Wednesday. No sudden onset headache, thunderclap headache, recent injury or trauma. Has been taking Sudafed with relief. Patient states that since he stopped taking the Sudafed his headache returns. Headache located to left temporal region. No eye pain or vision changes. Denies facial droop, lightheadedness, dizziness, paresthesias, weakness, chest pain, shortness of breath, abdominal pain, diarrhea, dysuria. Endorses photophobia without phonophobia. No fever, chills, neck stiffness, neck rigidity, sore throat. Rates pain a 8/10. Denies additional aggravating or alleviating factors. Had surgery in February for osteomyelitis, followed by [facility]. Was seen yesterday and told his wounds look well-healing. He has not checked his blood sugar since Wednesday. He is on insulin. No polyuria, polydipsia. Did have 3 episodes of emesis over the last 4 days. Denies additional aggravating or relieving factors.  History obtained from patient and past medical records. No interpreter is used.  HPI     Past Medical History:  Diagnosis Date  . Diabetes mellitus     Patient Active Problem List   Diagnosis Date Noted  . Severe nonproliferative diabetic retinopathy of right eye, with macular edema, associated with type 1 diabetes mellitus ([place]) 07/18/2019  . Severe nonproliferative diabetic retinopathy of left eye, with macular edema, associated with type 2 diabetes mellitus ([place]) 07/18/2019  . Diabetic cataract ([place]) 07/18/2019  . Osteomyelitis of ankle or foot, acute, left ([place]) 03/11/2019  . Pneumonia  due to severe acute respiratory syndrome coronavirus 2 (SARS-CoV-2) 02/12/2019  . Pneumonia due to COVID-19 virus 02/07/2019  . Acute respiratory failure with hypoxia ([place]) 02/07/2019  . Uncontrolled type 1 diabetes mellitus with hyperglycemia ([place]) 02/07/2019  . COVID-19 virus infection 02/04/2019  . Sepsis ([place]) 02/03/2019  . Type 1 diabetes mellitus with diabetic cataract ([place]) 02/03/2019  . AKI (acute kidney injury) ([place]) 02/03/2019  . CAP (community acquired pneumonia) 06/28/2017  . 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 Hyperglycemia without ketosis 01/19/2013    Past Surgical History:  Procedure Laterality Date  . 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 NO PAST SURGERIES         Family History  Problem Relation Age of Onset  . Hypertension Other   . Diabetes Other   . Healthy Mother     Social History   Tobacco Use  . Smoking status: Never Smoker  . Smokeless tobacco: Never Used  Substance Use Topics  . Alcohol use: No  . Drug use: No    Home Medications Prior to Admission medications   Medication Sig Start Date End Date Taking? Authorizing Provider  acetaminophen (TYLENOL) 500 MG tablet Take 500-1,000 mg by mouth every 6 (six) hours as needed for mild pain, fever or headache.     [provider]  Continuous Blood Gluc Sensor (FREESTYLE LIBRE 14 DAY SENSOR) MISC Inject 1 patch into the skin every 14 (fourteen) days.    [provider]  Continuous Blood Gluc Sensor MISC 1 each by Does not apply route as directed. Use  as directed every 14 days. May dispense FreeStyle [employer] or similar. 06/30/17   Darlin Drop, DO  HYDROcodone-acetaminophen (NORCO) 5-325 MG tablet Take 1 tablet by mouth every 6 (six) hours as needed for severe pain. 05/16/19   Toni Arthurs, MD  insulin  aspart (NOVOLOG) 100 UNIT/ML injection Inject 0-15 Units into the skin 3 (three) times daily with meals. Patient taking differently: Inject 7-12 Units into the skin 3 (three) times daily as needed (before meals, if BGL is 250 or greater).  07/01/17   Lonia Blood, MD  Insulin Glargine (LANTUS SOLOSTAR) 100 UNIT/ML Solostar Pen Inject 40 Units into the skin daily before breakfast.    [provider]  sitaGLIPtin (JANUVIA) 50 MG tablet Take 50 mg by mouth daily.    [provider]    Allergies    Basaglar kwikpen [insulin glargine], Metformin and related, and Trulicity [dulaglutide]  Review of Systems   Review of Systems  Constitutional: Negative.   HENT: Negative.   Eyes: Positive for photophobia. Negative for pain, discharge, redness, itching and visual disturbance.  Respiratory: Negative.   Cardiovascular: Negative.   Gastrointestinal: Negative.   Genitourinary: Negative.   Musculoskeletal: Negative.   Skin: Negative.   Neurological: Positive for headaches. Negative for dizziness, tremors, seizures, syncope, facial asymmetry, speech difficulty, weakness, light-headedness and numbness.  All other systems reviewed and are negative.   Physical Exam Updated Vital Signs BP (!) 151/88 (BP Location: Left Arm)   Pulse 84   Temp 98.6 F (37 C) (Oral)   Resp 18   Ht 6\' 5"  (1.956 m)   Wt 122.5 kg   SpO2 97%   BMI 32.02 kg/m   Physical Exam HENT:     Head:     Physical Exam  Constitutional: Pt is oriented to person, place, and time. Pt appears well-developed and well-nourished. No distress.  HENT:  Head: Normocephalic and atraumatic.  Mouth/Throat: Oropharynx is clear and moist.  Eyes: Conjunctivae and EOM are normal. Pupils are equal, round, and reactive to light. No scleral icterus.  No horizontal, vertical or rotational nystagmus  Neck: Normal range of motion. Neck supple.  Full active and passive ROM without pain No midline or paraspinal  tenderness No nuchal rigidity or meningeal signs  Cardiovascular: Normal rate, regular rhythm and intact distal pulses.   Pulmonary/Chest: Effort normal and breath sounds normal. No respiratory distress. Pt has no wheezes. No rales.  Abdominal: Soft. Bowel sounds are normal. There is no tenderness. There is no rebound and no guarding.  Musculoskeletal: Normal range of motion.  Lymphadenopathy:    No cervical adenopathy.  Neurological: Pt. is alert and oriented to person, place, and time. He has normal reflexes. No cranial nerve deficit.  Exhibits normal muscle tone. Coordination normal.  Mental Status:  Alert, oriented, thought content appropriate. Speech fluent without evidence of aphasia. Able to follow 2 step commands without difficulty.  Cranial Nerves:  II:  Peripheral visual fields grossly normal, pupils equal, round, reactive to light III,IV, VI: ptosis not present, extra-ocular motions intact bilaterally  V,VII: smile symmetric, facial light touch sensation equal VIII: hearing grossly normal bilaterally  IX,X: midline uvula rise  XI: bilateral shoulder shrug equal and strong XII: midline tongue extension  Motor:  5/5 in upper and lower extremities bilaterally including strong and equal grip strength and dorsiflexion/plantar flexion Sensory: Pinprick and light touch normal in all extremities.  Deep Tendon Reflexes: 2+ and symmetric  Cerebellar: normal finger-to-nose with bilateral upper extremities Gait:  normal gait and balance CV: distal pulses palpable throughout   Skin: Skin is warm and dry. No rash noted. Pt is not diaphoretic.  Psychiatric: Pt has a normal mood and affect. Behavior is normal. Judgment and thought content normal.  Nursing note and vitals reviewed.  ED Results / Procedures / Treatments   Labs (all labs ordered are listed, but only abnormal results are displayed) Labs Reviewed  CBG MONITORING, ED - Abnormal; Notable for the following components:       Result Value   Glucose-Capillary 283 (*)    All other components within normal limits    EKG None  Radiology No results found.  Procedures Procedures (including critical care time)  Medications Ordered in ED Medications  prochlorperazine (COMPAZINE) injection 10 mg (10 mg Intravenous Given 08/17/19 0943)  diphenhydrAMINE (BENADRYL) injection 25 mg (25 mg Intravenous Given 08/17/19 0944)  ketorolac (TORADOL) 15 MG/ML injection 15 mg (15 mg Intravenous Given 08/17/19 0943)  sodium chloride 0.9 % bolus 1,000 mL (0 mLs Intravenous Stopped 08/17/19 1227)    ED Course  I have reviewed the triage vital signs and the nursing notes.  Pertinent labs & imaging results that were available during my care of the patient were reviewed by me and considered in my medical decision making (see chart for details).  34 year old male with complex medical history presents for evaluation of headache since Wednesday, 4 days PTA. Has been taking Sudafed which relieves his headache however returns. No sudden onset thunderclap headache, recent injury or head trauma. He has a nonfocal neuro exam without deficits. States headache similar to prior migraine headaches. 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 He is followed by [facility] for this. Was seen yesterday and was told his wound had improved. He has no fever, chills. Does not appear septic. No drainage to lower extremity wounds. He is ambulatory. Fortunately has not checked his blood sugar since Wednesday and is on insulin. We will plan on CBG. No polyuria, polydipsia. No kussmal breathing. Low suspicion for DKA. He does have some tenderness over his left temporal region however his primary pain is more located over his left, mid central forehead. Plan on migraine cocktail given this feels similar. Low suspicion for temporal arteritis, acute angle glaucoma, dissection, ACS, mass, ICH,  meningitis.  1045: Patient reassessed.  Headache with significant improvement however he is sleepy after medication.  We will plan to reassess.  1300: Patient reassessed.  Complete resolution with migraine cocktail.  CBG initially 283 received 1 L of fluids.  Repeat 200.  Trending down.  Low suspicion for DKA.  Unfortunately repeat CBG not entering into epic.  Pt HA treated and improved while in ED.  Presentation is like pts typical HA.   The patient has been appropriately medically screened and/or stabilized in the ED. I have low suspicion for any other emergent medical condition which would require further screening, evaluation or treatment in the ED or require inpatient management.  Patient is hemodynamically stable and in no acute distress.  Patient able to ambulate in department prior to ED.  Evaluation does not show acute pathology that would require ongoing or additional emergent interventions while in the emergency department or further inpatient treatment.  I have discussed the diagnosis with the patient and answered all questions.  Pain is been managed while in the emergency department and patient has no further complaints prior to discharge.  Patient is comfortable with  plan discussed in room and is stable for discharge at this time.  I have discussed strict return precautions for returning to the emergency department.  Patient was encouraged to follow-up with PCP/specialist refer to at discharge.    MDM Rules/Calculators/A&P                       Final Clinical Impression(s) / ED Diagnoses Final diagnoses:  Migraine without aura and without status migrainosus, not intractable    Rx / DC Orders ED Discharge Orders    None       Octavie Westerhold A, PA-C 08/17/19 1308    Little, Ambrose Finland, MD 08/17/19 1410

## 2019-08-17 NOTE — Discharge Instructions (Signed)
Primary care provider within the next 2 to 3 days for recheck.  Return for any new worsening symptoms.

## 2019-08-17 NOTE — ED Notes (Signed)
Pt was able to eat and drink with no apparent difficulty or problems swallowing.

## 2019-08-17 NOTE — ED Notes (Signed)
Patient verbalizes understanding of discharge instructions. Opportunity for questioning and answers were provided. Armband removed by staff, pt discharged from ED.  

## 2019-08-17 NOTE — ED Triage Notes (Signed)
Pt reports L sided headache since wed, has taken otc pain relievers without relief. Has also had a few episodes of vomiting, endorses photophobia, denies fevers, chills or neck stiffness/pain, or hx of migraines. A/ox4, resp e/u, nad, speech clear, neuro intact.

## 2019-08-20 ENCOUNTER — Emergency Department (HOSPITAL_COMMUNITY)
Admission: EM | Admit: 2019-08-20 | Discharge: 2019-08-20 | Disposition: A | Discharge status: Home / Self Care | Payer: BC Managed Care – PPO | Attending: Emergency Medicine | Admitting: Emergency Medicine

## 2019-08-20 ENCOUNTER — Encounter (HOSPITAL_COMMUNITY): Payer: Self-pay | Admitting: *Deleted

## 2019-08-20 ENCOUNTER — Emergency Department (HOSPITAL_COMMUNITY): Payer: BC Managed Care – PPO

## 2019-08-20 DIAGNOSIS — R519 Headache, unspecified: Secondary | ICD-10-CM | POA: Diagnosis not present

## 2019-08-20 DIAGNOSIS — R112 Nausea with vomiting, unspecified: Secondary | ICD-10-CM | POA: Diagnosis present

## 2019-08-20 DIAGNOSIS — Z794 Long term (current) use of insulin: Secondary | ICD-10-CM | POA: Insufficient documentation

## 2019-08-20 DIAGNOSIS — E1036 Type 1 diabetes mellitus with diabetic cataract: Secondary | ICD-10-CM | POA: Diagnosis not present

## 2019-08-20 DIAGNOSIS — R110 Nausea: Secondary | ICD-10-CM

## 2019-08-20 DIAGNOSIS — Z79899 Other long term (current) drug therapy: Secondary | ICD-10-CM | POA: Insufficient documentation

## 2019-08-20 DIAGNOSIS — E103411 Type 1 diabetes mellitus with severe nonproliferative diabetic retinopathy with macular edema, right eye: Secondary | ICD-10-CM | POA: Diagnosis not present

## 2019-08-20 LAB — COMPREHENSIVE METABOLIC PANEL
ALT: 19 U/L (ref 0–44)
AST: 16 U/L (ref 15–41)
Albumin: 3.8 g/dL (ref 3.5–5.0)
Alkaline Phosphatase: 91 U/L (ref 38–126)
Anion gap: 9 (ref 5–15)
BUN: 12 mg/dL (ref 6–20)
CO2: 31 mmol/L (ref 22–32)
Calcium: 9.9 mg/dL (ref 8.9–10.3)
Chloride: 96 mmol/L — ABNORMAL LOW (ref 98–111)
Creatinine, Ser: 1 mg/dL (ref 0.61–1.24)
GFR calc Af Amer: >60 mL/min (ref >60)
GFR calc non Af Amer: >60 mL/min (ref >60)
Glucose, Bld: 245 mg/dL — ABNORMAL HIGH (ref 70–99)
Potassium: 4.4 mmol/L (ref 3.5–5.1)
Sodium: 136 mmol/L (ref 135–145)
Total Bilirubin: 0.5 mg/dL (ref 0.3–1.2)
Total Protein: 7.7 g/dL (ref 6.5–8.1)

## 2019-08-20 LAB — URINALYSIS, ROUTINE W REFLEX MICROSCOPIC
Bilirubin Urine: NEGATIVE
Glucose, UA: 50 mg/dL — AB
Hgb urine dipstick: NEGATIVE
Ketones, ur: NEGATIVE mg/dL
Leukocytes,Ua: NEGATIVE
Nitrite: NEGATIVE
Protein, ur: NEGATIVE mg/dL
Specific Gravity, Urine: 1.015 (ref 1.005–1.030)
pH: 7 (ref 5.0–8.0)

## 2019-08-20 LAB — CBC
HCT: 42.6 % (ref 39.0–52.0)
Hemoglobin: 13.6 g/dL (ref 13.0–17.0)
MCH: 27.1 pg (ref 26.0–34.0)
MCHC: 31.9 g/dL (ref 30.0–36.0)
MCV: 85 fL (ref 80.0–100.0)
Platelets: 252 10*3/uL (ref 150–400)
RBC: 5.01 MIL/uL (ref 4.22–5.81)
RDW: 12.5 % (ref 11.5–15.5)
WBC: 4.8 10*3/uL (ref 4.0–10.5)
nRBC: 0 % (ref 0.0–0.2)

## 2019-08-20 LAB — LIPASE, BLOOD: Lipase: 17 U/L (ref 11–51)

## 2019-08-20 MED ORDER — LANTUS SOLOSTAR 100 UNIT/ML ~~LOC~~ SOPN: 40.0000 [IU] | PEN_INJECTOR | Freq: Every day | SUBCUTANEOUS | 2 refills | Status: AC

## 2019-08-20 MED ORDER — SODIUM CHLORIDE 0.9% FLUSH: 3.0000 mL | Freq: Once | INTRAVENOUS | Status: DC

## 2019-08-20 MED ORDER — METOCLOPRAMIDE HCL 10 MG PO TABS: 10.0000 mg | ORAL_TABLET | Freq: Four times a day (QID) | ORAL | 0 refills | Status: DC | PRN

## 2019-08-20 MED ORDER — TETRACAINE HCL 0.5 % OP SOLN
1.0000 [drp] | Freq: Once | OPHTHALMIC | Status: AC
  Administered 2019-08-20: 1 [drp] via OPHTHALMIC
  Filled 2019-08-20: qty 4

## 2019-08-20 MED ORDER — LACTATED RINGERS IV BOLUS
1000.0000 mL | Freq: Once | INTRAVENOUS | Status: AC
  Administered 2019-08-20: 1000 mL via INTRAVENOUS

## 2019-08-20 MED ORDER — METOCLOPRAMIDE HCL 5 MG/ML IJ SOLN
10.0000 mg | Freq: Once | INTRAMUSCULAR | Status: AC
  Administered 2019-08-20: 10 mg via INTRAVENOUS
  Filled 2019-08-20: qty 2

## 2019-08-20 NOTE — ED Notes (Signed)
Pt d/c home per MD order. Discharge summary reviewed with pt, pt verbalizes understanding. Off unit via WC. NAD

## 2019-08-20 NOTE — ED Provider Notes (Signed)
[name] EMERGENCY DEPARTMENT Provider Note   CSN: [id number] Arrival date & time: 08/20/19  0442     History Chief Complaint  Patient presents with  . Nausea  . Emesis    [name] Schickling is a 34 y.o. male.  Patient is a 34 year old male presenting today with persistent headache nausea and vomiting.  Patient was seen in the emergency room 3 days ago for similar symptoms.  At that time his labs showed hyperglycemia but no other acute findings.  He was given a headache cocktail with improvement in his symptoms and he was discharged home.  Patient reports that he received an injection in his left eye for his diabetes the day before his symptoms started.  He describes as a headache over the left side of his eye that causes photophobia.  He was initially taking Sudafed that he had at home because he reported it was for headaches but in the last 3 days he has only been taking ibuprofen and some Zofran he had leftover.  Patient reports the headache is mild and usually improved when he takes ibuprofen.  However the nausea and vomiting has worsened.  He reports on Saturday he felt good and on Sunday he was okay with taking Zofran and ibuprofen.  However yesterday while he was at school he started to have significant episodes of vomiting.  He vomited approximately 6 or 7 times yesterday and then woke up in the middle of the night vomiting and has continued to vomit here.  He denies any unilateral weakness, numbness, speech changes.  The headache is dull and constant but no thunderclap headache.  He reports his vision is better but he has never received injection in his eye before.  He has no abdominal pain and last bowel movement was yesterday.  His food intake has significantly decreased due to the nausea.  He has not had fever, congestion, cough or URI type symptoms.  He has not been vaccinated against Covid but did have Covid during the pandemic.   The history is provided by the  patient.  Emesis Severity:  Severe Timing:  Intermittent Quality:  Stomach contents Progression:  Worsening Chronicity:  New Recent urination:  Decreased Context: not post-tussive and not self-induced   Relieved by:  Antiemetics (Initially helped by Zofran over the weekend but now it is not helping) Worsened by:  Liquids Associated symptoms: headaches   Associated symptoms: no abdominal pain, no arthralgias, no chills, no cough, no diarrhea, no fever, no myalgias, no sore throat and no URI   Risk factors: diabetes   Risk factors comment:  No recent changes in diabetic medications.  Did report that he got a shot in his eye on the day before his symptoms started.      Past Medical History:  Diagnosis Date  . Diabetes mellitus     Patient Active Problem List   Diagnosis Date Noted  . 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 Severe nonproliferative diabetic retinopathy of left eye, with macular edema, associated with type 2 diabetes mellitus (HCC) 07/18/2019  . Diabetic cataract (HCC) 07/18/2019  . Osteomyelitis of ankle or foot, acute, left (HCC) 03/11/2019  . Pneumonia due to severe acute respiratory syndrome coronavirus 2 (SARS-CoV-2) 02/12/2019  . Pneumonia due to COVID-19 virus 02/07/2019  . Acute respiratory failure with hypoxia (HCC) 02/07/2019  . Uncontrolled type 1 diabetes mellitus with hyperglycemia (HCC) 02/07/2019  . COVID-19  virus infection 02/04/2019  . Sepsis (HCC) 02/03/2019  . 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 AKI (acute kidney injury) (HCC) 02/03/2019  . CAP (community acquired pneumonia) 06/28/2017  . 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 Hyperglycemia without ketosis 01/19/2013    Past Surgical History:  Procedure Laterality Date  . 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 NO PAST SURGERIES         Family History  Problem Relation Age of Onset  . Hypertension Other   . Diabetes Other   . Healthy Mother     Social History   Tobacco Use  . Smoking status: Never Smoker  . Smokeless tobacco: Never Used  Substance Use Topics  . Alcohol use: No  . Drug use: No    Home Medications Prior to Admission medications   Medication Sig Start Date End Date Taking? Authorizing Provider  acetaminophen (TYLENOL) 500 MG tablet Take 500-1,000 mg by mouth every 6 (six) hours as needed for mild pain, fever or headache.     [provider]  Continuous Blood Gluc Sensor (FREESTYLE LIBRE 14 DAY SENSOR) MISC Inject 1 patch into the skin every 14 (fourteen) days.    [provider]  Continuous Blood Gluc Sensor MISC 1 each by Does not apply route as directed. Use as directed every 14 days. May dispense FreeStyle [employer] or similar. 06/30/17   Darlin Drop, DO  HYDROcodone-acetaminophen (NORCO) 5-325 MG tablet Take 1 tablet by mouth every 6 (six) hours as needed for severe pain. 05/16/19   Toni Arthurs, MD  insulin aspart (NOVOLOG) 100 UNIT/ML injection Inject 0-15 Units into the skin 3 (three) times daily with meals. Patient taking differently: Inject 7-12 Units into the skin 3 (three) times daily as needed (before meals, if BGL is 250 or greater).  07/01/17   Lonia Blood, MD  Insulin Glargine (LANTUS SOLOSTAR) 100 UNIT/ML Solostar Pen Inject 40 Units into the skin daily before breakfast.    [provider]  sitaGLIPtin (JANUVIA) 50 MG tablet Take 50 mg by mouth daily.    [provider]    Allergies    Basaglar kwikpen [insulin glargine], Metformin and related, and Trulicity [dulaglutide]  Review of Systems   Review of Systems  Constitutional: Negative for chills and fever.  HENT: Negative for  sore throat.   Respiratory: Negative for cough.   Gastrointestinal: Positive for vomiting. Negative for abdominal pain and diarrhea.  Musculoskeletal: Negative for arthralgias and myalgias.  Neurological: Positive for headaches.  All other systems reviewed and are negative.   Physical Exam Updated Vital Signs BP (!) 156/97   Pulse 79   Temp 98.7 F (37.1 C) (Oral)   Resp 18   SpO2 99%   Physical Exam Vitals and nursing note reviewed.  Constitutional:      General: He is not in acute distress.    Appearance: Normal appearance. He is well-developed. He is obese.  HENT:     Head: Normocephalic and atraumatic.     Right Ear: Tympanic membrane normal.     Left Ear: Tympanic membrane normal.     Nose: Nose normal.  Eyes:     Extraocular Movements: Extraocular movements intact.     Conjunctiva/sclera: Conjunctivae normal.     Pupils: Pupils are equal, round, and reactive  to light.     Comments: No injection or swelling of the conjunctive a  Cardiovascular:     Rate and Rhythm: Normal rate and regular rhythm.     Heart sounds: No murmur.  Pulmonary:     Effort: Pulmonary effort is normal. No respiratory distress.     Breath sounds: Normal breath sounds. No wheezing or rales.  Abdominal:     General: There is no distension.     Palpations: Abdomen is soft.     Tenderness: There is no abdominal tenderness. There is no guarding or rebound.  Musculoskeletal:        General: No tenderness. Normal range of motion.     Cervical back: Normal range of motion and neck supple. No tenderness. No spinous process tenderness or muscular tenderness.     Right lower leg: No edema.     Left lower leg: No edema.  Skin:    General: Skin is warm and dry.     Findings: No erythema or rash.  Neurological:     General: No focal deficit present.     Mental Status: He is alert and oriented to person, place, and time. Mental status is at baseline.     Cranial Nerves: No cranial nerve deficit.      Sensory: No sensory deficit.     Motor: No weakness.     Gait: Gait normal.  Psychiatric:        Mood and Affect: Mood normal.        Behavior: Behavior normal.        Thought Content: Thought content normal.     ED Results / Procedures / Treatments   Labs (all labs ordered are listed, but only abnormal results are displayed) Labs Reviewed  COMPREHENSIVE METABOLIC PANEL - Abnormal; Notable for the following components:      Result Value   Chloride 96 (*)    Glucose, Bld 245 (*)    All other components within normal limits  URINALYSIS, ROUTINE W REFLEX MICROSCOPIC - Abnormal; Notable for the following components:   Glucose, UA 50 (*)    All other components within normal limits  LIPASE, BLOOD  CBC    EKG None  Radiology CT HEAD WO CONTRAST  Result Date: 08/20/2019 CLINICAL DATA:  Acute LEFT temporal headache since last Thursday, no injury, normal neurological exam EXAM: CT HEAD WITHOUT CONTRAST TECHNIQUE: Contiguous axial images were obtained from the base of the skull through the vertex without intravenous contrast. Sagittal and coronal MPR images reconstructed from axial data set. COMPARISON:  None FINDINGS: Brain: Normal ventricular morphology. No midline shift or mass effect. Normal appearance of brain parenchyma. No intracranial hemorrhage, mass lesion or evidence of acute infarction. No extra-axial fluid collections. Vascular: Unremarkable.  No hyperdense vessels Skull: Intact Sinuses/Orbits: Clear Other: N/A IMPRESSION: Normal exam. Electronically Signed   By: Ulyses Southward M.D.   On: 08/20/2019 11:30    Procedures Procedures (including critical care time)  Medications Ordered in ED Medications  sodium chloride flush (NS) 0.9 % injection 3 mL (has no administration in time range)  tetracaine (PONTOCAINE) 0.5 % ophthalmic solution 1 drop (has no administration in time range)  lactated ringers bolus 1,000 mL (has no administration in time range)  metoCLOPramide (REGLAN)  injection 10 mg (has no administration in time range)    ED Course  I have reviewed the triage vital signs and the nursing notes.  Pertinent labs & imaging results that were available during my care of  the patient were reviewed by me and considered in my medical decision making (see chart for details).    MDM Rules/Calculators/A&P                      34 year old male returning today with persistent nausea and vomiting and mild headache.  Patient seen 3 days ago for similar symptoms.  Patient reports he has never had a headache like this before and no history that I can find of migraine headaches.  Patient's headache was not sudden onset but did start after he reports an injection in his left eye.  Pupils are reactive and lower suspicion for glaucoma but will check left eye pressure due to ongoing symptoms of nausea and vomiting.  Patient has not recently received any vaccines and lower suspicion for venous thrombosis.  However we will do a CT as this is patient's second visit and symptoms are not improving to look for evidence of increased intracranial pressure or mass.  Lower suspicion for subarachnoid hemorrhage as patient symptoms were not sudden in nature worst headache of his life.  Low suspicion for acute abdominal or cardiac/pulmonary process or DKA as patient's labs are relatively normal other than mild hyperglycemia of 245.  There is a normal anion gap of 9 with a normal CBC.  Patient given IV fluids and Reglan.  10:24 AM IOP of the left eye is 13.  12:22 PM Head CT without acute findings.  After Reglan patient has been able to drink Sprite with no further vomiting.  He reports he is feeling better.  At this time unclear why patient has had persistent nausea and vomiting.  Will discharge home with a prescription for Reglan but encouraged close follow-up with his PCP if symptoms do not resolve.  Also patient ran out of his Lantus on Sunday and is been trying to get a refill from his doctor  and this medication was refilled for him today.  MDM Number of Diagnoses or Management Options   Amount and/or Complexity of Data Reviewed Clinical lab tests: ordered and reviewed Tests in the radiology section of CPT: ordered and reviewed Decide to obtain previous medical records or to obtain history from someone other than the patient: yes Obtain history from someone other than the patient: no Review and summarize past medical records: yes Discuss the patient with other providers: no Independent visualization of images, tracings, or specimens: yes  Risk of Complications, Morbidity, and/or Mortality Presenting problems: moderate Diagnostic procedures: low Management options: low  Patient Progress Patient progress: improved   Final Clinical Impression(s) / ED Diagnoses Final diagnoses:  Nausea  Non-intractable vomiting with nausea, unspecified vomiting type  Nonintractable headache, unspecified chronicity pattern, unspecified headache type    Rx / DC Orders ED Discharge Orders         Ordered    metoCLOPramide (REGLAN) 10 MG tablet  Every 6 hours PRN     08/20/19 1154    insulin glargine (LANTUS SOLOSTAR) 100 UNIT/ML Solostar Pen  Daily before breakfast     05 /18/21 Grant, MD 08/20/19 1223

## 2019-08-20 NOTE — ED Notes (Signed)
Pt transported to CT ?

## 2019-08-20 NOTE — ED Triage Notes (Signed)
To ED for further eval of nausea/vomiting. Seen here Saturday night and dx with migraine. States Sunday he felt ok - started vomiting and nausea yesterday that has continued, leading pt to ED. Taking Zofran and ibuprofen. States ibuprofen is helping his HA but remains photophobic.

## 2019-08-20 NOTE — Discharge Instructions (Signed)
Your labs and CAT scan were normal today.  Also the pressure in your eye was normal.  You were given a prescription for nausea medicine to use as needed.  However if this continues you need to follow-up with your doctor by Friday.  If you start having high fever, any vision changes or with one-sided weakness or any new pain in your abdomen return to the emergency room immediately.

## 2019-08-21 ENCOUNTER — Emergency Department (HOSPITAL_COMMUNITY): Payer: BC Managed Care – PPO

## 2019-08-21 ENCOUNTER — Other Ambulatory Visit: Payer: Self-pay

## 2019-08-21 ENCOUNTER — Emergency Department (HOSPITAL_COMMUNITY)
Admission: EM | Admit: 2019-08-21 | Discharge: 2019-08-21 | Disposition: A | Discharge status: Home / Self Care | Payer: BC Managed Care – PPO | Attending: Emergency Medicine | Admitting: Emergency Medicine

## 2019-08-21 ENCOUNTER — Encounter (HOSPITAL_COMMUNITY): Payer: Self-pay | Admitting: Emergency Medicine

## 2019-08-21 DIAGNOSIS — R112 Nausea with vomiting, unspecified: Secondary | ICD-10-CM | POA: Diagnosis not present

## 2019-08-21 DIAGNOSIS — E119 Type 2 diabetes mellitus without complications: Secondary | ICD-10-CM | POA: Diagnosis not present

## 2019-08-21 DIAGNOSIS — Z794 Long term (current) use of insulin: Secondary | ICD-10-CM | POA: Insufficient documentation

## 2019-08-21 DIAGNOSIS — Z20822 Contact with and (suspected) exposure to covid-19: Secondary | ICD-10-CM | POA: Insufficient documentation

## 2019-08-21 DIAGNOSIS — Z8616 Personal history of COVID-19: Secondary | ICD-10-CM | POA: Insufficient documentation

## 2019-08-21 DIAGNOSIS — R1084 Generalized abdominal pain: Secondary | ICD-10-CM | POA: Insufficient documentation

## 2019-08-21 DIAGNOSIS — R519 Headache, unspecified: Secondary | ICD-10-CM | POA: Diagnosis not present

## 2019-08-21 LAB — HIV ANTIBODY (ROUTINE TESTING W REFLEX): HIV Screen 4th Generation wRfx: NONREACTIVE

## 2019-08-21 LAB — COMPREHENSIVE METABOLIC PANEL
ALT: 19 U/L (ref 0–44)
AST: 18 U/L (ref 15–41)
Albumin: 4 g/dL (ref 3.5–5.0)
Alkaline Phosphatase: 88 U/L (ref 38–126)
Anion gap: 15 (ref 5–15)
BUN: 13 mg/dL (ref 6–20)
CO2: 28 mmol/L (ref 22–32)
Calcium: 9.9 mg/dL (ref 8.9–10.3)
Chloride: 94 mmol/L — ABNORMAL LOW (ref 98–111)
Creatinine, Ser: 1.1 mg/dL (ref 0.61–1.24)
GFR calc Af Amer: >60 mL/min (ref >60)
GFR calc non Af Amer: >60 mL/min (ref >60)
Glucose, Bld: 262 mg/dL — ABNORMAL HIGH (ref 70–99)
Potassium: 4.1 mmol/L (ref 3.5–5.1)
Sodium: 137 mmol/L (ref 135–145)
Total Bilirubin: 0.9 mg/dL (ref 0.3–1.2)
Total Protein: 8.2 g/dL — ABNORMAL HIGH (ref 6.5–8.1)

## 2019-08-21 LAB — CBC
HCT: 44.4 % (ref 39.0–52.0)
Hemoglobin: 14.1 g/dL (ref 13.0–17.0)
MCH: 26.9 pg (ref 26.0–34.0)
MCHC: 31.8 g/dL (ref 30.0–36.0)
MCV: 84.7 fL (ref 80.0–100.0)
Platelets: 264 10*3/uL (ref 150–400)
RBC: 5.24 MIL/uL (ref 4.22–5.81)
RDW: 12.2 % (ref 11.5–15.5)
WBC: 5.4 10*3/uL (ref 4.0–10.5)
nRBC: 0 % (ref 0.0–0.2)

## 2019-08-21 LAB — CBG MONITORING, ED
Glucose-Capillary: 234 mg/dL — ABNORMAL HIGH (ref 70–99)
Glucose-Capillary: 236 mg/dL — ABNORMAL HIGH (ref 70–99)
Glucose-Capillary: 199 mg/dL — ABNORMAL HIGH (ref 70–99)

## 2019-08-21 LAB — LIPASE, BLOOD: Lipase: 16 U/L (ref 11–51)

## 2019-08-21 LAB — SARS CORONAVIRUS 2 BY RT PCR (HOSPITAL ORDER, PERFORMED IN ~~LOC~~ HOSPITAL LAB): SARS Coronavirus 2: NEGATIVE

## 2019-08-21 MED ORDER — ONDANSETRON 4 MG PO TBDP
4.0000 mg | ORAL_TABLET | Freq: Three times a day (TID) | ORAL | 0 refills | Status: DC | PRN
Start: 2019-08-21 — End: 2019-09-28

## 2019-08-21 MED ORDER — INSULIN ASPART 100 UNIT/ML ~~LOC~~ SOLN
6.0000 [IU] | Freq: Once | SUBCUTANEOUS | Status: AC
  Administered 2019-08-21: 6 [IU] via INTRAVENOUS

## 2019-08-21 MED ORDER — SODIUM CHLORIDE 0.9% FLUSH: 3.0000 mL | Freq: Once | INTRAVENOUS | Status: DC

## 2019-08-21 MED ORDER — IOHEXOL 300 MG/ML  SOLN
100.0000 mL | Freq: Once | INTRAMUSCULAR | Status: AC | PRN
  Administered 2019-08-21: 100 mL via INTRAVENOUS

## 2019-08-21 MED ORDER — DIPHENHYDRAMINE HCL 50 MG/ML IJ SOLN
25.0000 mg | Freq: Once | INTRAMUSCULAR | Status: AC
  Administered 2019-08-21: 25 mg via INTRAVENOUS
  Filled 2019-08-21: qty 1

## 2019-08-21 MED ORDER — SODIUM CHLORIDE 0.9 % IV BOLUS
1000.0000 mL | Freq: Once | INTRAVENOUS | Status: AC
  Administered 2019-08-21: 1000 mL via INTRAVENOUS

## 2019-08-21 MED ORDER — METOCLOPRAMIDE HCL 5 MG/ML IJ SOLN
10.0000 mg | Freq: Once | INTRAMUSCULAR | Status: AC
  Administered 2019-08-21: 10 mg via INTRAVENOUS
  Filled 2019-08-21: qty 2

## 2019-08-21 NOTE — ED Notes (Signed)
Pt able to eat 1/4 of sandwich without complaint of N/V

## 2019-08-21 NOTE — ED Notes (Signed)
Patient verbalizes understanding of discharge instructions. Opportunity for questioning and answers were provided. Armband removed by staff, pt discharged from ED ambulatory w/ aunt

## 2019-08-21 NOTE — ED Provider Notes (Signed)
[name] EMERGENCY DEPARTMENT Provider Note   CSN: [id number] Arrival date & time: 08/21/19  1603     History Chief Complaint  Patient presents with  . Nausea  . Emesis  . Headache    [name] is a 34 y.o. male.  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   Patient has also recently been out of his long-acting insulin.  He states that his blood sugars are somewhat elevated at baseline.  He states that for the past 1 week he has had intermittent nausea and vomiting.  He states he has been unable to tolerate p.o.  He was initially seen for a headache and states he felt better after discharge however he states that he has had intermittent nausea since that time.  He was seen yesterday and had CT scan of head and intraocular pressures checked due to provider concern for intracranial pathology or acute glaucoma.  Patient denies any visual symptoms to me today, denies any eye pain, visual defects, blurry vision.  He states that he has had episodes of vomiting that is nonbloody nonbilious too numerous to count.  He states that his last visit vomiting was in the waiting room.  Patient states he has never been seen by gastroenterology before.  He has never been informed of the issue of gastroparesis.  Denies any marijuana use.    Patient states that his headaches were well controlled with ibuprofen until he started vomiting.  He states that he has no abdominal pain, lightheadedness or dizziness.  No chest pain or shortness of breath.  No diarrhea.  Past Medical History:  Diagnosis Date  . Diabetes mellitus     Patient Active Problem List   Diagnosis Date Noted  . 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 Severe  nonproliferative diabetic retinopathy of left eye, with macular edema, associated with type 2 diabetes mellitus (HCC) 07/18/2019  . Diabetic cataract (HCC) 07/18/2019  . Osteomyelitis of ankle or foot, acute, left (HCC) 03/11/2019  . Pneumonia due to severe acute respiratory syndrome coronavirus 2 (SARS-CoV-2) 02/12/2019  . Pneumonia due to COVID-19 virus 02/07/2019  . Acute respiratory failure with hypoxia (HCC) 02/07/2019  . Uncontrolled type 1 diabetes mellitus with hyperglycemia (HCC) 02/07/2019  . COVID-19 virus infection 02/04/2019  . Sepsis (HCC) 02/03/2019  . 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 AKI (acute kidney injury) (HCC) 02/03/2019  . CAP (community acquired pneumonia) 06/28/2017  . 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 Hyperglycemia without ketosis 01/19/2013    Past Surgical History:  Procedure Laterality Date  . 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;  Surgeon: Toni Arthurs, MD;  Location: [facility];  Service: Orthopedics;  Laterality: Left;  . NO PAST SURGERIES         Family History  Problem Relation Age of Onset  . Hypertension Other   . Diabetes Other   . Healthy Mother     Social History   Tobacco Use  . Smoking status: Never Smoker  . Smokeless tobacco: Never Used  Substance Use Topics  . Alcohol use: No  . Drug use: No    Home Medications Prior to Admission medications   Medication Sig Start Date  End Date Taking? Authorizing Provider  acetaminophen (TYLENOL) 500 MG tablet Take 500-1,000 mg by mouth every 6 (six) hours as needed for mild pain, fever or headache.    Yes [provider]  Continuous Blood Gluc Sensor (FREESTYLE LIBRE 14 DAY SENSOR) MISC Inject 1 patch into the skin every 14 (fourteen) days.   Yes [provider]  Continuous Blood Gluc Sensor MISC 1 each by Does not apply route as directed. Use as directed every 14  days. May dispense FreeStyle [employer] Sensor System or similar. 06/30/17  Yes Hall, Carole N, DO  insulin aspart (NOVOLOG) 100 UNIT/ML injection Inject 0-15 Units into the skin 3 (three) times daily with meals. Patient taking differently: Inject 7-12 Units into the skin 3 (three) times daily as needed (before meals, if BGL is 250 or greater).  07/01/17  Yes Lonia BloodMcClung, Jeffrey T, MD  insulin glargine (LANTUS SOLOSTAR) 100 UNIT/ML Solostar Pen Inject 40 Units into the skin daily before breakfast. 08/20/19  Yes Plunkett, Alphonzo LemmingsWhitney, MD  metoCLOPramide (REGLAN) 10 MG tablet Take 1 tablet (10 mg total) by mouth every 6 (six) hours as needed for nausea or vomiting. 08/20/19  Yes Gwyneth SproutPlunkett, Whitney, MD  HYDROcodone-acetaminophen (NORCO) 5-325 MG tablet Take 1 tablet by mouth every 6 (six) hours as needed for severe pain. Patient not taking: Reported on 08/20/2019 05/16/19   Toni ArthursHewitt, John, MD  ondansetron (ZOFRAN ODT) 4 MG disintegrating tablet Take 1 tablet (4 mg total) by mouth every 8 (eight) hours as needed for nausea or vomiting. 08/21/19   Gailen ShelterFondaw, Tyriq Moragne S, PA    Allergies    Basaglar kwikpen [insulin glargine], Metformin and related, and Trulicity [dulaglutide]  Review of Systems   Review of Systems  Constitutional: Negative for chills and fever.  HENT: Negative for congestion.   Eyes: Negative for pain.  Respiratory: Negative for cough and shortness of breath.   Cardiovascular: Negative for chest pain and leg swelling.  Gastrointestinal: Positive for nausea and vomiting. Negative for abdominal pain and diarrhea.  Genitourinary: Negative for dysuria.  Musculoskeletal: Negative for myalgias.  Skin: Negative for rash.  Neurological: Positive for headaches. Negative for dizziness.    Physical Exam Updated Vital Signs BP (!) 150/96   Pulse 90   Temp (!) 97.4 F (36.3 C) (Oral)   Resp 20   Ht 6\' 5"  (1.956 m)   Wt 122.5 kg   SpO2 100%   BMI 32.02 kg/m   Physical Exam Vitals and nursing note  reviewed.  Constitutional:      General: He is not in acute distress.    Comments: Patient is pleasant 34 year old male in no acute distress.  HENT:     Head: Normocephalic and atraumatic.     Nose: Nose normal.     Mouth/Throat:     Mouth: Mucous membranes are dry.  Eyes:     General: No scleral icterus. Cardiovascular:     Rate and Rhythm: Normal rate and regular rhythm.     Pulses: Normal pulses.     Heart sounds: Normal heart sounds.  Pulmonary:     Effort: Pulmonary effort is normal. No respiratory distress.     Breath sounds: No wheezing.  Abdominal:     Palpations: Abdomen is soft.     Tenderness: There is no abdominal tenderness.  Musculoskeletal:     Cervical back: Normal range of motion.     Right lower leg: No edema.     Left lower leg: No edema.  Skin:  General: Skin is warm and dry.     Capillary Refill: Capillary refill takes less than 2 seconds.  Neurological:     Mental Status: He is alert. Mental status is at baseline.     Comments: Alert and oriented to self, place, time and event.   Speech is fluent, clear without dysarthria or dysphasia.   Strength 5/5 in upper/lower extremities  Sensation intact in upper/lower extremities   Normal gait.  Negative Romberg. No pronator drift.  Normal finger-to-nose and feet tapping.  CN I not tested  CN II grossly intact visual fields bilaterally. Did not visualize posterior eye.   CN III, IV, VI PERRLA and EOMs intact bilaterally  CN V Intact sensation to sharp and light touch to the face  CN VII facial movements symmetric  CN VIII not tested  CN IX, X no uvula deviation, symmetric rise of soft palate  CN XI 5/5 SCM and trapezius strength bilaterally  CN XII Midline tongue protrusion, symmetric L/R movements   Psychiatric:        Mood and Affect: Mood normal.        Behavior: Behavior normal.     ED Results / Procedures / Treatments   Labs (all labs ordered are listed, but only abnormal results are  displayed) Labs Reviewed  COMPREHENSIVE METABOLIC PANEL - Abnormal; Notable for the following components:      Result Value   Chloride 94 (*)    Glucose, Bld 262 (*)    Total Protein 8.2 (*)    All other components within normal limits  CBG MONITORING, ED - Abnormal; Notable for the following components:   Glucose-Capillary 234 (*)    All other components within normal limits  CBG MONITORING, ED - Abnormal; Notable for the following components:   Glucose-Capillary 236 (*)    All other components within normal limits  CBG MONITORING, ED - Abnormal; Notable for the following components:   Glucose-Capillary 199 (*)    All other components within normal limits  SARS CORONAVIRUS 2 BY RT PCR (HOSPITAL ORDER, PERFORMED IN [facility])  LIPASE, BLOOD  CBC  HIV ANTIBODY (ROUTINE TESTING W REFLEX)    EKG None  Radiology CT HEAD WO CONTRAST  Result Date: 08/20/2019 CLINICAL DATA:  Acute LEFT temporal headache since last Thursday, no injury, normal neurological exam EXAM: CT HEAD WITHOUT CONTRAST TECHNIQUE: Contiguous axial images were obtained from the base of the skull through the vertex without intravenous contrast. Sagittal and coronal MPR images reconstructed from axial data set. COMPARISON:  None FINDINGS: Brain: Normal ventricular morphology. No midline shift or mass effect. Normal appearance of brain parenchyma. No intracranial hemorrhage, mass lesion or evidence of acute infarction. No extra-axial fluid collections. Vascular: Unremarkable.  No hyperdense vessels Skull: Intact Sinuses/Orbits: Clear Other: N/A IMPRESSION: Normal exam. Electronically Signed   By: Ulyses Southward M.D.   On: 08/20/2019 11:30   CT ABDOMEN PELVIS W CONTRAST  Result Date: 08/21/2019 CLINICAL DATA:  Abd pain, acute, generalized, with neutropenia Epigastric abdominal pain. Vomiting. Nausea. EXAM: CT ABDOMEN AND PELVIS WITH CONTRAST TECHNIQUE: Multidetector CT imaging of the abdomen and pelvis was  performed using the standard protocol following bolus administration of intravenous contrast. CONTRAST:  OMNIPAQUE IOHEXOL 300 MG/ML  SOLN COMPARISON:  Abdominal CT 07/27/2017. Abdominal MRI 02/18/2018 FINDINGS: Lower chest: No consolidation or pleural fluid. Hepatobiliary: Mild hepatic steatosis. No focal hepatic lesion. Gallbladder physiologically distended, no calcified stone. No biliary dilatation. Pancreas: No ductal dilatation or inflammation. Spleen: Low-density  lesions in the spleen measuring up to 2 cm, unchanged from prior MRI and considered benign. Normal in size. Adrenals/Urinary Tract: Normal adrenal glands. No hydronephrosis or perinephric edema. Homogeneous renal enhancement. There is early excretion of contrast in the renal collecting systems which limits assessment for renal stone. Urinary bladder is physiologically distended without wall thickening. Stomach/Bowel: Stomach is unremarkable. There is no small bowel obstruction or inflammation. Normal appendix courses centrally in the upper pelvis, series 3, image 70. No appendicitis. Mild distal colonic diverticulosis. No acute diverticulitis. Vascular/Lymphatic: Normal caliber abdominal aorta. Portal vein and mesenteric vessels are patent. No adenopathy. Few prominent inguinal nodes are likely reactive. Reproductive: Prostate gland is unremarkable. Other: Tiny fat containing umbilical hernia. No ascites or free air. Musculoskeletal: Transitional lumbosacral anatomy. There are no acute or suspicious osseous abnormalities. IMPRESSION: 1. No acute abnormality in the abdomen/pelvis. 2. Mild hepatic steatosis. 3. Mild distal colonic diverticulosis without acute inflammation. Electronically Signed   By: Keith Rake M.D.   On: 08/21/2019 21:12    Procedures Procedures (including critical care time)  Medications Ordered in ED Medications  sodium chloride flush (NS) 0.9 % injection 3 mL (3 mLs Intravenous Not Given 08/21/19 1954)  sodium  chloride 0.9 % bolus 1,000 mL (1,000 mLs Intravenous New Bag/Given 08/21/19 1914)  metoCLOPramide (REGLAN) injection 10 mg (10 mg Intravenous Given 08/21/19 1912)  diphenhydrAMINE (BENADRYL) injection 25 mg (25 mg Intravenous Given 08/21/19 1912)  insulin aspart (novoLOG) injection 6 Units (6 Units Intravenous Given 08/21/19 2111)  iohexol (OMNIPAQUE) 300 MG/ML solution 100 mL (100 mLs Intravenous Contrast Given 08/21/19 2051)    ED Course  I have reviewed the triage vital signs and the nursing notes.  Pertinent labs & imaging results that were available during my care of the patient were reviewed by me and considered in my medical decision making (see chart for details).  Patient is a 33 year old male presented today with persistent nausea vomiting for 1 week with mild headache.  He states that his headache was improved with ibuprofen however he has been vomiting and unable to tolerate p.o. or her medications for the past week and states that he has been unable to take his ibuprofen for this.  Patient states that he has had headaches before but nothing like this before.  He has no documented history of migraine headaches.  Patient states that his headache came on gradually denies any thunderclap symptoms he states that he had an injection in his left eye for diabetic retinopathy 3 weeks ago but states that his headache and nausea and vomiting began 1 week ago.  Patient is seen in emergency department yesterday had CT scan of head, Reglan and fluids and had normal eye pressures.  Doubt increased intracranial pressure as etiology of patient's symptoms.  Also very low suspicion for acute glaucoma given normal intraocular pressures yesterday.  Is this patient's third visit for headache and second visit for nausea will obtain CT scan of abdomen pelvis with contrast to evaluate for intra-abdominal mass such as a obstructing gastric outlet mass.  Clinical Course as of Aug 20 2225  Wed Aug 21, 2019  2141 CT  scan independently viewed by myself. Agree of radiology read. IMPRESSION: 1. No acute abnormality in the abdomen/pelvis. 2. Mild hepatic steatosis. 3. Mild distal colonic diverticulosis without acute inflammation.   [WF]  2200 CBC without leukocytosis or anemia.  Lipase within normal limits pancreatitis.  CMP notable for elevated blood sugar 262.  Will provide patient with small dose of  insulin and recheck CBG.  HIV screen nonreactive.   [WF]  2200 EKG without acute abnormality.  No ischemia.   [WF]  2213 CBG improved to 199.  Patient will finish his fluids.  He is not tachycardic, has no anion gap, is well-appearing.  He has no nausea or vomiting at this time.  He has insulin at home and will continue use.  He will take his Lantus tonight.   [WF]    Clinical Course User Index [WF] Gailen Shelter, [place]   Patient has had no episodes of nausea or vomiting since Reglan ministration.  He states his headache is completely gone and his nausea and vomiting normally are present.  He states he feels well enough to go home.  Plan is to prescribe patient Zofran which will dissolve under his tongue if he is intractably vomiting.  He has Reglan to use if he is not vomiting but feeling nauseous.  He will stay hydrated eat plenty of food and check his blood sugars.  I suspect that his symptoms are secondary to gastroparesis given his history of diabetes and poor blood sugar control.  Patient given strict return precautions.  He will follow up with gastroenterology.  I discussed this case with my attending physician who cosigned this note including patient's presenting symptoms, physical exam, and planned diagnostics and interventions. Attending physician stated agreement with plan or made changes to plan which were implemented.   Attending physician assessed patient at bedside.   MDM Rules/Calculators/A&P                       Final Clinical Impression(s) / ED Diagnoses Final diagnoses:    Non-intractable vomiting with nausea, unspecified vomiting type    Rx / DC Orders ED Discharge Orders         Ordered    Ambulatory referral to Gastroenterology     08/21/19 2153    ondansetron (ZOFRAN ODT) 4 MG disintegrating tablet  Every 8 hours PRN     08/21/19 2155           Gailen Shelter, [place] 08/21/19 2227    Maia Plan, MD 08/22/19 [phone]

## 2019-08-21 NOTE — Discharge Instructions (Addendum)
Please use Zofran and Reglan for nausea.  Please regulate your blood sugar with insulin.  Please follow-up with the gastroenterology group that I have given you the phone number for.

## 2019-08-21 NOTE — ED Triage Notes (Signed)
Patient arrives to ED with complaints of persistent headache, nausea, and vomiting for the past week. Patient sent home with PRN Reglan but patient states this has not helped. Patient continues to not be able to tolerate fluids. Patient states headache is dull and constant. Patient denies abdominal pain. Patient CBG 200's but slightly hypotensive with systolic BP 90's.

## 2019-09-09 ENCOUNTER — Encounter: Payer: Self-pay | Admitting: Emergency Medicine

## 2019-09-13 ENCOUNTER — Encounter: Payer: Self-pay | Admitting: Nurse Practitioner

## 2019-09-19 ENCOUNTER — Encounter (INDEPENDENT_AMBULATORY_CARE_PROVIDER_SITE_OTHER): Payer: BC Managed Care – PPO | Admitting: Ophthalmology

## 2019-09-24 ENCOUNTER — Encounter (INDEPENDENT_AMBULATORY_CARE_PROVIDER_SITE_OTHER): Payer: Self-pay | Admitting: Ophthalmology

## 2019-09-24 ENCOUNTER — Other Ambulatory Visit: Payer: Self-pay

## 2019-09-24 ENCOUNTER — Ambulatory Visit (INDEPENDENT_AMBULATORY_CARE_PROVIDER_SITE_OTHER): Payer: BC Managed Care – PPO | Admitting: Ophthalmology

## 2019-09-24 DIAGNOSIS — E103412 Type 1 diabetes mellitus with severe nonproliferative diabetic retinopathy with macular edema, left eye: Secondary | ICD-10-CM | POA: Diagnosis not present

## 2019-09-24 MED ORDER — AFLIBERCEPT 2MG/0.05ML IZ SOLN FOR KALEIDOSCOPE
2.0000 mg | INTRAVITREAL | Status: AC | PRN
  Administered 2019-09-24: 2 mg via INTRAVITREAL

## 2019-09-24 NOTE — Assessment & Plan Note (Signed)
Improved status post Eylea No. 1 OS, will repeat intravitreal injection OS today and examination in 8 weeks

## 2019-09-24 NOTE — Progress Notes (Signed)
09/24/2019     CHIEF COMPLAINT Patient presents for Retina Follow Up   HISTORY OF PRESENT ILLNESS: [name] is a 34 y.o. male who presents to the clinic today for:   HPI    Retina Follow Up    Patient presents with  Diabetic Retinopathy.  In left eye.  Severity is moderate.  Duration of 7.5 weeks.  Since onset it is stable.  I, the attending physician,  performed the HPI with the patient and updated documentation appropriately.          Comments    7.5 Week NPDR f\u OS. Possible Eylea OS. OC  Pt states vision has improved. Denies any complaints. BGL: 180       Last edited by Elyse Jarvis on 09/24/2019 10:53 AM. (History)      Referring physician: [name], MD [address] [place],  [place] [address]  HISTORICAL INFORMATION:   Selected notes from the MEDICAL RECORD NUMBER    Lab Results  Component Value Date   HGBA1C 9.9 (H) 02/03/2019     CURRENT MEDICATIONS: No current outpatient medications on file. (Ophthalmic Drugs)   No current facility-administered medications for this visit. (Ophthalmic Drugs)   Current Outpatient Medications (Other)  Medication Sig  . acetaminophen (TYLENOL) 500 MG tablet Take 500-1,000 mg by mouth every 6 (six) hours as needed for mild pain, fever or headache.   . Continuous Blood Gluc Sensor (FREESTYLE LIBRE 14 DAY SENSOR) MISC Inject 1 patch into the skin every 14 (fourteen) days.  . Continuous Blood Gluc Sensor MISC 1 each by Does not apply route as directed. Use as directed every 14 days. May dispense FreeStyle [employer] or similar.  . HYDROcodone-acetaminophen (NORCO) 5-325 MG tablet Take 1 tablet by mouth every 6 (six) hours as needed for severe pain. (Patient not taking: Reported on 08/20/2019)  . insulin aspart (NOVOLOG) 100 UNIT/ML injection Inject 0-15 Units into the skin 3 (three) times daily with meals. (Patient taking differently: Inject 7-12 Units into the skin 3 (three) times daily as  needed (before meals, if BGL is 250 or greater). )  . insulin glargine (LANTUS SOLOSTAR) 100 UNIT/ML Solostar Pen Inject 40 Units into the skin daily before breakfast.  . metoCLOPramide (REGLAN) 10 MG tablet Take 1 tablet (10 mg total) by mouth every 6 (six) hours as needed for nausea or vomiting.  . ondansetron (ZOFRAN ODT) 4 MG disintegrating tablet Take 1 tablet (4 mg total) by mouth every 8 (eight) hours as needed for nausea or vomiting.   No current facility-administered medications for this visit. (Other)      REVIEW OF SYSTEMS: ROS    Positive for: Endocrine   Last edited by Elyse Jarvis on 09/24/2019 10:53 AM. (History)       ALLERGIES Allergies  Allergen Reactions  . Basaglar Stephanie Coup [Insulin Glargine] Diarrhea  . Metformin And Related Diarrhea and Nausea And Vomiting  . Trulicity [Dulaglutide] Diarrhea    PAST MEDICAL HISTORY Past Medical History:  Diagnosis Date  . Diabetes mellitus    Past Surgical History:  Procedure Laterality Date  . 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;  Surgeon: Toni Arthurs, MD;  Location: [facility];  Service: Orthopedics;  Laterality: Left;  . NO PAST SURGERIES      FAMILY HISTORY Family History  Problem Relation Age of Onset  . Hypertension Other   . Diabetes Other   . Healthy Mother  SOCIAL HISTORY Social History   Tobacco Use  . Smoking status: Never Smoker  . Smokeless tobacco: Never Used  Vaping Use  . Vaping Use: Never used  Substance Use Topics  . Alcohol use: No  . Drug use: No         OPHTHALMIC EXAM:  Base Eye Exam    Visual Acuity (Snellen - Linear)      Right Left   Dist [place] 20/30 20/60   Dist ph [place]  20/30 -1       Tonometry (Tonopen, 10:58 AM)      Right Left   Pressure 13 13       Pupils      Pupils Dark Light Shape React APD   Right PERRL 4 3 Round Brisk None   Left PERRL 4 3 Round Brisk None       Visual Fields (Counting fingers)       Left Right    Full Full       Neuro/Psych    Oriented x3: Yes   Mood/Affect: Normal       Dilation    Left eye: 1.0% Mydriacyl, 2.5% Phenylephrine @ 10:58 AM        Slit Lamp and Fundus Exam    External Exam      Right Left   External Normal Normal       Slit Lamp Exam      Right Left   Lids/Lashes Normal Normal   Conjunctiva/Sclera White and quiet White and quiet   Cornea Clear Clear   Anterior Chamber Deep and quiet Deep and quiet   Iris Round and reactive Round and reactive   Lens 1+ Nuclear sclerosis 1+ Nuclear sclerosis, sub anterior capsule opacity   Anterior Vitreous Normal Normal       Fundus Exam      Right Left   Posterior Vitreous  Normal   Disc  Normal   C/D Ratio  0.5   Macula  Exudates, Microaneurysms, Macular thickening minor   Vessels  Severe nonproliferative diabetic retinopathy   Periphery  Normal          IMAGING AND PROCEDURES  Imaging and Procedures for 09/24/19  OCT, Retina - OU - Both Eyes       Right Eye Quality was good. Scan locations included subfoveal. Central Foveal Thickness: 248. Progression has been stable.   Left Eye Quality was good. Scan locations included subfoveal. Central Foveal Thickness: 258. Progression has improved.   Notes OS improved status post injection Eylea number 1, will repeat injection today and examination at longer interval 8 weeks.       Intravitreal Injection, Pharmacologic Agent - OS - Left Eye       Time Out 09/24/2019. 11:39 AM. Confirmed correct patient, procedure, site, and patient consented.   Anesthesia Topical anesthesia was used. Anesthetic medications included Akten 3.5%.   Procedure Preparation included Ofloxacin , 10% betadine to eyelids. A 30 gauge needle was used.   Injection:  2 mg aflibercept Gretta Cool) SOLN   NDC: [id number], Lot: [id number]   Route: Intravitreal, Site: Left Eye, Waste: 0 mg  Post-op Post injection exam found visual acuity of at least counting  fingers. The patient tolerated the procedure well. There were no complications. The patient received written and verbal post procedure care education. Post injection medications were not given.                 ASSESSMENT/PLAN:  Severe nonproliferative diabetic retinopathy of left  eye, with macular edema, associated with type 1 diabetes mellitus (HCC) Improved status post Eylea No. 1 OS, will repeat intravitreal injection OS today and examination in 8 weeks      ICD-10-CM   1. Severe nonproliferative diabetic retinopathy of left eye, with macular edema, associated with type 1 diabetes mellitus (HCC)  E10.3412 OCT, Retina - OU - Both Eyes    Intravitreal Injection, Pharmacologic Agent - OS - Left Eye    aflibercept (EYLEA) SOLN 2 mg    1.  OS has improved macular perfusion and much less CME and much less risk to the visual function status post intravitreal Eylea No. 1.  We will repeat Eylea injection OS today  2.  3.  Ophthalmic Meds Ordered this visit:  Meds ordered this encounter  Medications  . aflibercept (EYLEA) SOLN 2 mg       Return in about 8 weeks (around 11/19/2019) for DILATE OU, OS, EYLEA OCT.  There are no Patient Instructions on file for this visit.   Explained the diagnoses, plan, and follow up with the patient and they expressed understanding.  Patient expressed understanding of the importance of proper follow up care.   Clent Demark Luchiano Viscomi M.D. Diseases & Surgery of the Retina and Vitreous Retina & Diabetic [place] 09/24/19     Abbreviations: M myopia (nearsighted); A astigmatism; H hyperopia (farsighted); P presbyopia; Mrx spectacle prescription;  CTL contact lenses; OD right eye; OS left eye; OU both eyes  XT exotropia; ET esotropia; PEK punctate epithelial keratitis; PEE punctate epithelial erosions; DES dry eye syndrome; MGD meibomian gland dysfunction; ATs artificial tears; PFAT's preservative free artificial tears; [place] nuclear sclerotic cataract;  PSC posterior subcapsular cataract; ERM epi-retinal membrane; PVD posterior vitreous detachment; RD retinal detachment; DM diabetes mellitus; DR diabetic retinopathy; NPDR non-proliferative diabetic retinopathy; PDR proliferative diabetic retinopathy; CSME clinically significant macular edema; DME diabetic macular edema; dbh dot blot hemorrhages; CWS cotton wool spot; POAG primary open angle glaucoma; C/D cup-to-disc ratio; HVF humphrey visual field; GVF goldmann visual field; OCT optical coherence tomography; IOP intraocular pressure; BRVO Branch retinal vein occlusion; CRVO central retinal vein occlusion; CRAO central retinal artery occlusion; BRAO branch retinal artery occlusion; RT retinal tear; SB scleral buckle; PPV pars plana vitrectomy; VH Vitreous hemorrhage; PRP panretinal laser photocoagulation; IVK intravitreal kenalog; VMT vitreomacular traction; MH Macular hole;  NVD neovascularization of the disc; NVE neovascularization elsewhere; AREDS age related eye disease study; ARMD age related macular degeneration; POAG primary open angle glaucoma; EBMD epithelial/anterior basement membrane dystrophy; ACIOL anterior chamber intraocular lens; IOL intraocular lens; PCIOL posterior chamber intraocular lens; Phaco/IOL phacoemulsification with intraocular lens placement; [place] photorefractive keratectomy; LASIK laser assisted in situ keratomileusis; HTN hypertension; DM diabetes mellitus; COPD chronic obstructive pulmonary disease

## 2019-09-26 ENCOUNTER — Other Ambulatory Visit: Payer: Self-pay

## 2019-09-26 ENCOUNTER — Ambulatory Visit (HOSPITAL_COMMUNITY)
Admission: EM | Admit: 2019-09-26 | Discharge: 2019-09-26 | Disposition: A | Discharge status: Home / Self Care | Payer: BC Managed Care – PPO | Attending: Family Medicine | Admitting: Family Medicine

## 2019-09-26 ENCOUNTER — Encounter (HOSPITAL_COMMUNITY): Payer: Self-pay | Admitting: Emergency Medicine

## 2019-09-26 DIAGNOSIS — L97511 Non-pressure chronic ulcer of other part of right foot limited to breakdown of skin: Secondary | ICD-10-CM

## 2019-09-26 DIAGNOSIS — E11621 Type 2 diabetes mellitus with foot ulcer: Secondary | ICD-10-CM

## 2019-09-26 MED ORDER — DOXYCYCLINE HYCLATE 100 MG PO CAPS
100.0000 mg | ORAL_CAPSULE | Freq: Two times a day (BID) | ORAL | 0 refills | Status: DC
Start: 2019-09-26 — End: 2019-10-04

## 2019-09-26 NOTE — ED Triage Notes (Signed)
Patient has had a callus removed from bottom of right foot.  This was done by a pediatrist.  Wound has opened up, opened up one week ago .  Patient is a diabetic. Patient reports cbg readings 170 to 200

## 2019-09-26 NOTE — ED Notes (Signed)
Dressing applied using nonstick telfa, kerlix, and ace wrap.

## 2019-09-28 ENCOUNTER — Ambulatory Visit (INDEPENDENT_AMBULATORY_CARE_PROVIDER_SITE_OTHER): Payer: BC Managed Care – PPO

## 2019-09-28 ENCOUNTER — Emergency Department (HOSPITAL_COMMUNITY): Payer: BC Managed Care – PPO

## 2019-09-28 ENCOUNTER — Ambulatory Visit: Payer: BC Managed Care – PPO | Admitting: Podiatry

## 2019-09-28 ENCOUNTER — Encounter (HOSPITAL_COMMUNITY): Payer: Self-pay | Admitting: Emergency Medicine

## 2019-09-28 ENCOUNTER — Emergency Department (HOSPITAL_COMMUNITY)
Admit: 2019-09-28 | Discharge: 2019-09-28 | Disposition: A | Discharge status: Home / Self Care | Payer: BC Managed Care – PPO | Attending: Emergency Medicine | Admitting: Emergency Medicine

## 2019-09-28 ENCOUNTER — Inpatient Hospital Stay (HOSPITAL_COMMUNITY)
Admission: EM | Admit: 2019-09-28 | Discharge: 2019-10-04 | DRG: 623 | Disposition: A | Discharge status: Home / Self Care | Payer: BC Managed Care – PPO | Source: Ambulatory Visit | Attending: Internal Medicine | Admitting: Internal Medicine

## 2019-09-28 ENCOUNTER — Encounter: Payer: Self-pay | Admitting: Podiatry

## 2019-09-28 ENCOUNTER — Other Ambulatory Visit: Payer: Self-pay

## 2019-09-28 DIAGNOSIS — M79672 Pain in left foot: Secondary | ICD-10-CM | POA: Diagnosis not present

## 2019-09-28 DIAGNOSIS — Z833 Family history of diabetes mellitus: Secondary | ICD-10-CM | POA: Diagnosis not present

## 2019-09-28 DIAGNOSIS — D509 Iron deficiency anemia, unspecified: Secondary | ICD-10-CM | POA: Diagnosis present

## 2019-09-28 DIAGNOSIS — E11628 Type 2 diabetes mellitus with other skin complications: Secondary | ICD-10-CM | POA: Diagnosis present

## 2019-09-28 DIAGNOSIS — L97509 Non-pressure chronic ulcer of other part of unspecified foot with unspecified severity: Secondary | ICD-10-CM | POA: Diagnosis not present

## 2019-09-28 DIAGNOSIS — Z89432 Acquired absence of left foot: Secondary | ICD-10-CM | POA: Diagnosis not present

## 2019-09-28 DIAGNOSIS — Z794 Long term (current) use of insulin: Secondary | ICD-10-CM | POA: Diagnosis not present

## 2019-09-28 DIAGNOSIS — M869 Osteomyelitis, unspecified: Secondary | ICD-10-CM | POA: Diagnosis present

## 2019-09-28 DIAGNOSIS — Z888 Allergy status to other drugs, medicaments and biological substances status: Secondary | ICD-10-CM

## 2019-09-28 DIAGNOSIS — L02612 Cutaneous abscess of left foot: Secondary | ICD-10-CM | POA: Diagnosis present

## 2019-09-28 DIAGNOSIS — L03115 Cellulitis of right lower limb: Secondary | ICD-10-CM

## 2019-09-28 DIAGNOSIS — M7989 Other specified soft tissue disorders: Secondary | ICD-10-CM | POA: Diagnosis not present

## 2019-09-28 DIAGNOSIS — E669 Obesity, unspecified: Secondary | ICD-10-CM | POA: Diagnosis present

## 2019-09-28 DIAGNOSIS — Z8249 Family history of ischemic heart disease and other diseases of the circulatory system: Secondary | ICD-10-CM

## 2019-09-28 DIAGNOSIS — R609 Edema, unspecified: Secondary | ICD-10-CM | POA: Diagnosis not present

## 2019-09-28 DIAGNOSIS — L0390 Cellulitis, unspecified: Secondary | ICD-10-CM | POA: Diagnosis not present

## 2019-09-28 DIAGNOSIS — L97529 Non-pressure chronic ulcer of other part of left foot with unspecified severity: Secondary | ICD-10-CM | POA: Diagnosis present

## 2019-09-28 DIAGNOSIS — B9562 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 classified elsewhere: Secondary | ICD-10-CM | POA: Diagnosis present

## 2019-09-28 DIAGNOSIS — E1069 Type 1 diabetes mellitus with other specified complication: Secondary | ICD-10-CM | POA: Diagnosis present

## 2019-09-28 DIAGNOSIS — E10628 Type 1 diabetes mellitus with other skin complications: Secondary | ICD-10-CM | POA: Diagnosis present

## 2019-09-28 DIAGNOSIS — L089 Local infection of the skin and subcutaneous tissue, unspecified: Secondary | ICD-10-CM | POA: Diagnosis not present

## 2019-09-28 DIAGNOSIS — D649 Anemia, unspecified: Secondary | ICD-10-CM | POA: Diagnosis present

## 2019-09-28 DIAGNOSIS — Z5329 Procedure and treatment not carried out because of patient's decision for other reasons: Secondary | ICD-10-CM | POA: Diagnosis not present

## 2019-09-28 DIAGNOSIS — L97519 Non-pressure chronic ulcer of other part of right foot with unspecified severity: Secondary | ICD-10-CM

## 2019-09-28 DIAGNOSIS — E11621 Type 2 diabetes mellitus with foot ulcer: Secondary | ICD-10-CM

## 2019-09-28 DIAGNOSIS — Z20822 Contact with and (suspected) exposure to covid-19: Secondary | ICD-10-CM | POA: Diagnosis present

## 2019-09-28 DIAGNOSIS — Z6830 Body mass index (BMI) 30.0-30.9, adult: Secondary | ICD-10-CM

## 2019-09-28 DIAGNOSIS — M79671 Pain in right foot: Secondary | ICD-10-CM | POA: Diagnosis not present

## 2019-09-28 DIAGNOSIS — E08621 Diabetes mellitus due to underlying condition with foot ulcer: Secondary | ICD-10-CM

## 2019-09-28 DIAGNOSIS — L97415 Non-pressure chronic ulcer of right heel and midfoot with muscle involvement without evidence of necrosis: Secondary | ICD-10-CM

## 2019-09-28 DIAGNOSIS — D6489 Other specified anemias: Secondary | ICD-10-CM | POA: Diagnosis present

## 2019-09-28 DIAGNOSIS — E10621 Type 1 diabetes mellitus with foot ulcer: Secondary | ICD-10-CM | POA: Diagnosis present

## 2019-09-28 DIAGNOSIS — E1065 Type 1 diabetes mellitus with hyperglycemia: Secondary | ICD-10-CM | POA: Diagnosis present

## 2019-09-28 DIAGNOSIS — Z9889 Other specified postprocedural states: Secondary | ICD-10-CM

## 2019-09-28 LAB — CBC WITH DIFFERENTIAL/PLATELET
Abs Immature Granulocytes: 0.02 10*3/uL (ref 0.00–0.07)
Basophils Absolute: 0 10*3/uL (ref 0.0–0.1)
Basophils Relative: 1 %
Eosinophils Absolute: 0.5 10*3/uL (ref 0.0–0.5)
Eosinophils Relative: 8 %
HCT: 34.9 % — ABNORMAL LOW (ref 39.0–52.0)
Hemoglobin: 10.8 g/dL — ABNORMAL LOW (ref 13.0–17.0)
Immature Granulocytes: 0 %
Lymphocytes Relative: 22 %
Lymphs Abs: 1.4 10*3/uL (ref 0.7–4.0)
MCH: 26.8 pg (ref 26.0–34.0)
MCHC: 30.9 g/dL (ref 30.0–36.0)
MCV: 86.6 fL (ref 80.0–100.0)
Monocytes Absolute: 0.8 10*3/uL (ref 0.1–1.0)
Monocytes Relative: 12 %
Neutro Abs: 3.8 10*3/uL (ref 1.7–7.7)
Neutrophils Relative %: 57 %
Platelets: 328 10*3/uL (ref 150–400)
RBC: 4.03 MIL/uL — ABNORMAL LOW (ref 4.22–5.81)
RDW: 11.9 % (ref 11.5–15.5)
WBC: 6.5 10*3/uL (ref 4.0–10.5)
nRBC: 0 % (ref 0.0–0.2)

## 2019-09-28 LAB — COMPREHENSIVE METABOLIC PANEL
ALT: 13 U/L (ref 0–44)
AST: 12 U/L — ABNORMAL LOW (ref 15–41)
Albumin: 2.8 g/dL — ABNORMAL LOW (ref 3.5–5.0)
Alkaline Phosphatase: 77 U/L (ref 38–126)
Anion gap: 10 (ref 5–15)
BUN: 10 mg/dL (ref 6–20)
CO2: 28 mmol/L (ref 22–32)
Calcium: 9.8 mg/dL (ref 8.9–10.3)
Chloride: 98 mmol/L (ref 98–111)
Creatinine, Ser: 1.02 mg/dL (ref 0.61–1.24)
GFR calc Af Amer: >60 mL/min (ref >60)
GFR calc non Af Amer: >60 mL/min (ref >60)
Glucose, Bld: 215 mg/dL — ABNORMAL HIGH (ref 70–99)
Potassium: 4.6 mmol/L (ref 3.5–5.1)
Sodium: 136 mmol/L (ref 135–145)
Total Bilirubin: 0.4 mg/dL (ref 0.3–1.2)
Total Protein: 8 g/dL (ref 6.5–8.1)

## 2019-09-28 LAB — HEMOGLOBIN A1C
Hgb A1c MFr Bld: 11.7 % — ABNORMAL HIGH (ref 4.8–5.6)
Mean Plasma Glucose: 289.09 mg/dL

## 2019-09-28 LAB — SARS CORONAVIRUS 2 BY RT PCR (HOSPITAL ORDER, PERFORMED IN ~~LOC~~ HOSPITAL LAB): SARS Coronavirus 2: NEGATIVE

## 2019-09-28 LAB — SEDIMENTATION RATE
Sed Rate: 105 mm/hr — ABNORMAL HIGH (ref 0–16)
Sed Rate: 129 mm/hr — ABNORMAL HIGH (ref 0–16)

## 2019-09-28 LAB — C-REACTIVE PROTEIN: CRP: 5 mg/dL — ABNORMAL HIGH (ref <1.0)

## 2019-09-28 LAB — LACTIC ACID, PLASMA: Lactic Acid, Venous: 0.8 mmol/L (ref 0.5–1.9)

## 2019-09-28 LAB — HIV ANTIBODY (ROUTINE TESTING W REFLEX): HIV Screen 4th Generation wRfx: NONREACTIVE

## 2019-09-28 LAB — GLUCOSE, CAPILLARY: Glucose-Capillary: 257 mg/dL — ABNORMAL HIGH (ref 70–99)

## 2019-09-28 LAB — PROTIME-INR
INR: 1.1 (ref 0.8–1.2)
Prothrombin Time: 14 seconds (ref 11.4–15.2)

## 2019-09-28 LAB — PREALBUMIN: Prealbumin: 11.4 mg/dL — ABNORMAL LOW (ref 18–38)

## 2019-09-28 MED ORDER — SODIUM CHLORIDE 0.9 % IV SOLN
2.0000 g | INTRAVENOUS | Status: DC
  Administered 2019-09-28 – 2019-10-04 (×7): 2 g via INTRAVENOUS
  Filled 2019-09-28 (×7): qty 20

## 2019-09-28 MED ORDER — ACETAMINOPHEN 325 MG PO TABS: 650.0000 mg | ORAL_TABLET | Freq: Four times a day (QID) | ORAL | Status: DC | PRN

## 2019-09-28 MED ORDER — ENOXAPARIN SODIUM 40 MG/0.4ML ~~LOC~~ SOLN
40.0000 mg | SUBCUTANEOUS | Status: DC
  Administered 2019-09-29 – 2019-10-03 (×4): 40 mg via SUBCUTANEOUS
  Filled 2019-09-28 (×4): qty 0.4

## 2019-09-28 MED ORDER — METRONIDAZOLE 500 MG PO TABS
500.0000 mg | ORAL_TABLET | Freq: Three times a day (TID) | ORAL | Status: DC
  Administered 2019-09-28 – 2019-10-04 (×18): 500 mg via ORAL
  Filled 2019-09-28 (×19): qty 1

## 2019-09-28 MED ORDER — SODIUM CHLORIDE 0.9% FLUSH: 3.0000 mL | Freq: Once | INTRAVENOUS | Status: DC

## 2019-09-28 MED ORDER — ONDANSETRON HCL 4 MG PO TABS
4.0000 mg | ORAL_TABLET | Freq: Four times a day (QID) | ORAL | Status: DC | PRN
  Administered 2019-10-02: 4 mg via ORAL
  Filled 2019-09-28: qty 1

## 2019-09-28 MED ORDER — ALBUTEROL SULFATE (2.5 MG/3ML) 0.083% IN NEBU: 2.5000 mg | INHALATION_SOLUTION | Freq: Four times a day (QID) | RESPIRATORY_TRACT | Status: DC | PRN

## 2019-09-28 MED ORDER — ACETAMINOPHEN 650 MG RE SUPP: 650.0000 mg | Freq: Four times a day (QID) | RECTAL | Status: DC | PRN

## 2019-09-28 MED ORDER — ONDANSETRON HCL 4 MG/2ML IJ SOLN: 4.0000 mg | Freq: Four times a day (QID) | INTRAMUSCULAR | Status: DC | PRN

## 2019-09-28 MED ORDER — INSULIN GLARGINE 100 UNIT/ML ~~LOC~~ SOLN
40.0000 [IU] | Freq: Every day | SUBCUTANEOUS | Status: DC
  Administered 2019-09-29 – 2019-10-04 (×6): 40 [IU] via SUBCUTANEOUS
  Filled 2019-09-28 (×7): qty 0.4

## 2019-09-28 MED ORDER — HYDROCODONE-ACETAMINOPHEN 5-325 MG PO TABS
1.0000 | ORAL_TABLET | Freq: Four times a day (QID) | ORAL | Status: DC | PRN
  Administered 2019-10-04: 1 via ORAL
  Filled 2019-09-28: qty 1

## 2019-09-28 NOTE — Progress Notes (Signed)
Arrived to unit via stretcher. Oriented to unit. Changed into gown.

## 2019-09-28 NOTE — H&P (Signed)
History and Physical    [name] KVQ:[id number] DOB: Nov 14, 1985 DOA: 09/28/2019  Referring MD/NP/PA: Rayna Sexton, MD PCP: [name], MD  Patient coming from: Podiatry clinic  Chief Complaint: Wound of right foot  I have personally briefly reviewed patient's old medical records in [place]   HPI: [name] is a 34 y.o. 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  Patient had been being followed in outpatient setting by Dr. Barkley Bruns podiatry up until May where the callus which was removed had been healing.  However, since that time his podiatrist have been out sick and he has not been able to get into see him.  Over the last 2- 3 weeks he has had worsening swelling of the right foot and intermittently seen drainage.  Denies having any fever or chills.  He had been trying to keep the foot wrapped and was soaking his foot at home.  Our last week or so his blood sugars have been elevated despite him not eating much.  As his foot did not appear to be getting better so he went to urgent care on 6/24 and was given a prescription for doxycycline and referred to Dr. Earleen Newport of podiatry.  Patient also notes a wound that opened up slightly of his left foot as well.  He went to his office today and was evaluated.  Initial x-rays did not show any signs of any definitive signs of osteomyelitis, but ESR was elevated at 105.  The wounds were debrided in his office where pus drainage was appreciated, and cultures were obtained.  There was concern for deeper infection and he was sent to the ER for further management.  ED Course: On admission into the emergency department patient was seen to be afebrile with stable vital signs.  Labs significant for hemoglobin 10.8,  Glucose 215,  and lactic acid 0.8.  X-rays of the right foot showed shallow ulcerations without signs of osteomyelitis.  Doppler ultrasound on the lower extremities did not show any signs of a DVT.  MRI MRI of both feet were ordered.  TRH called to admit.  Review of Systems  Constitutional: Positive for malaise/fatigue. Negative for fever.  HENT: Negative for nosebleeds and sinus pain.   Eyes: Negative for photophobia and pain.  Respiratory: Negative for cough and wheezing.   Cardiovascular: Positive for leg swelling. Negative for chest pain.  Gastrointestinal: Negative for abdominal pain, diarrhea, nausea and vomiting.  Genitourinary: Negative for dysuria and hematuria.  Musculoskeletal: Negative for falls and joint pain.  Skin:       Positive for wound of the foot  Neurological: Negative for focal weakness and loss of consciousness.  Psychiatric/Behavioral: Negative for substance abuse. The patient is not nervous/anxious.     Past Medical History:  Diagnosis Date  . Diabetes mellitus     Past Surgical History:  Procedure Laterality Date  . 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;  Surgeon: Wylene Simmer, MD;  Location: [place];  Service: Orthopedics;  Laterality: Left;  . NO PAST SURGERIES       reports that he has never smoked. He has never used smokeless tobacco. He reports that he does not drink alcohol and does not use drugs.  Allergies  Allergen Reactions  . Kara Mead [  Insulin Glargine] Diarrhea  . Metformin And Related Diarrhea and Nausea And Vomiting  . Trulicity [Dulaglutide] Diarrhea    Family History  Problem Relation Age of Onset  . Hypertension Other   . Diabetes Other   . Healthy Mother     Prior to Admission medications   Medication Sig Start Date End Date Taking? Authorizing Provider  doxycycline (VIBRAMYCIN) 100 MG capsule Take 1 capsule (100 mg total) by mouth 2 (two) times daily. 09/26/19  Yes Hagler, Aaron Edelman, MD    insulin aspart (NOVOLOG) 100 UNIT/ML injection Inject 0-15 Units into the skin 3 (three) times daily with meals. Patient taking differently: Inject 7-12 Units into the skin 3 (three) times daily as needed (before meals, if BGL is 250 or greater).  07/01/17  Yes Cherene Altes, MD  insulin glargine (LANTUS SOLOSTAR) 100 UNIT/ML Solostar Pen Inject 40 Units into the skin daily before breakfast. 08/20/19  Yes Plunkett, Loree Fee, MD  Continuous Blood Gluc Sensor (FREESTYLE LIBRE 14 DAY SENSOR) MISC Inject 1 patch into the skin every 14 (fourteen) days.    [provider]  Continuous Blood Gluc Sensor MISC 1 each by Does not apply route as directed. Use as directed every 14 days. May dispense FreeStyle [employer] or similar. 06/30/17   Kayleen Memos, DO    Physical Exam:  Constitutional: Young male currently NAD, calm, comfortable Vitals:   09/28/19 1135  BP: 128/70  Pulse: 94  Resp: 16  Temp: 98.3 F (36.8 C)  TempSrc: Oral  SpO2: 99%  Weight: 117.9 kg  Height: '6\' 5"'$  (1.956 m)   Eyes: PERRL, lids and conjunctivae normal ENMT: Mucous membranes are moist. Posterior pharynx clear of any exudate or lesions. Neck: normal, supple, no masses, no thyromegaly Respiratory: clear to auscultation bilaterally, no wheezing, no crackles. Normal respiratory effort. No accessory muscle use.  Cardiovascular: Regular rate and rhythm, no murmurs / rubs / gallops.  Swelling noted of the right foot and leg. Abdomen: no tenderness, no masses palpated. No hepatosplenomegaly. Bowel sounds positive.  Musculoskeletal: no clubbing / cyanosis.  Right amputation of the fifth left toe. Skin: Ulcerations of the right lateral foot and left lateral foot respectively       Neurologic: CN 2-12 grossly intact. Sensation intact, DTR normal. Strength 5/5 in all 4.  Psychiatric: Normal judgment and insight. Alert and oriented x 3. Normal mood.     Labs on Admission: I have personally reviewed  following labs and imaging studies  CBC: Recent Labs  Lab 09/28/19 1151  WBC 6.5  NEUTROABS 3.8  HGB 10.8*  HCT 34.9*  MCV 86.6  PLT 542   Basic Metabolic Panel: Recent Labs  Lab 09/28/19 1151  NA 136  K 4.6  CL 98  CO2 28  GLUCOSE 215*  BUN 10  CREATININE 1.02  CALCIUM 9.8   GFR: Estimated Creatinine Clearance: 146.6 mL/min (by C-G formula based on SCr of 1.02 mg/dL). Liver Function Tests: Recent Labs  Lab 09/28/19 1151  AST 12*  ALT 13  ALKPHOS 77  BILITOT 0.4  PROT 8.0  ALBUMIN 2.8*   No results for input(s): LIPASE, AMYLASE in the last 168 hours. No results for input(s): AMMONIA in the last 168 hours. Coagulation Profile: Recent Labs  Lab 09/28/19 1420  INR 1.1   Cardiac Enzymes: No results for input(s): CKTOTAL, CKMB, CKMBINDEX, TROPONINI in the last 168 hours. BNP (last 3 results) No results for input(s): PROBNP in the last 8760 hours. HbA1C: No results for  input(s): HGBA1C in the last 72 hours. CBG: No results for input(s): GLUCAP in the last 168 hours. Lipid Profile: No results for input(s): CHOL, HDL, LDLCALC, TRIG, CHOLHDL, LDLDIRECT in the last 72 hours. Thyroid Function Tests: No results for input(s): TSH, T4TOTAL, FREET4, T3FREE, THYROIDAB in the last 72 hours. Anemia Panel: No results for input(s): VITAMINB12, FOLATE, FERRITIN, TIBC, IRON, RETICCTPCT in the last 72 hours. Urine analysis:    Component Value Date/Time   COLORURINE YELLOW 08/20/2019 0455   APPEARANCEUR CLEAR 08/20/2019 0455   LABSPEC 1.015 08/20/2019 0455   PHURINE 7.0 08/20/2019 0455   GLUCOSEU 50 (A) 08/20/2019 0455   HGBUR NEGATIVE 08/20/2019 0455   BILIRUBINUR NEGATIVE 08/20/2019 0455   KETONESUR NEGATIVE 08/20/2019 0455   PROTEINUR NEGATIVE 08/20/2019 0455   UROBILINOGEN 0.2 11/07/2011 2241   NITRITE NEGATIVE 08/20/2019 0455   LEUKOCYTESUR NEGATIVE 08/20/2019 0455   Sepsis Labs: No results found for this or any previous visit (from the past 240 hour(s)).    Radiological Exams on Admission: DG Foot Complete Right  Result Date: 09/28/2019 CLINICAL DATA:  Chronic wound and soft tissue swelling lateral to the 5th MTP joint on the right. Diabetes. EXAM: RIGHT FOOT COMPLETE - 3+ VIEW COMPARISON:  Earlier today elsewhere. FINDINGS: Shallow, elongated soft tissue defect in the lateral aspect of the distal foot. No soft tissue gas, bone destruction or periosteal reaction. No fractures are seen. IMPRESSION: Shallow, elongated soft tissue defect in the lateral aspect of the distal foot without evidence of underlying osteomyelitis. Electronically Signed   By: Claudie Revering M.D.   On: 09/28/2019 14:23    EKG:  Assessment/Plan Diabetic foot infection : Acute.  Patient presents after being seen at podiatry clinic for worsening diabetic foot ulcers most notably on the right lateral foot with pus drainage.  Patient had debridement in Dr. Pasty Arch office.  X-rays did not give a clear concern for osteomyelitis.  ESR was elevated at 105.  Suspect possibility of deeper underlying infection/osteomyelitis. -Admit to a MedSurg bed -Lower extremity wound order set utilized -Follow-up blood and wound cultures -Added on CRP  -Follow-up MRI of the feet -Start empiric antibiotics of Rocephin and metronidazole -Hydrocodone as needed for pain -Appreciate Dr. Earleen Newport of podiatry who will follow along with the patient in the hospital,  Diabetes mellitus type 1, uncontrolled: Patient previously noted to be uncontrolled.  Initial glucose elevated at 215.  Last hemoglobin A1c on file from 02/03/2019 was 9.9.  Home medication regimen includes glargine 40 units subcu daily and sliding scale with meals. -Hypoglycemic protocols -Check hemoglobin A1c -Continue home glargine dose -CBGs before every meal with moderate SSI -Diabetic education  Normocytic anemia: Hemoglobin 10.8 which appears lower than patient's previous baseline.  Patient did not report any complaints of  bleeding. -Recheck CBC in a.m.  DVT prophylaxis: Lovenox Code Status: Full Family Communication: Patient's aunt updated at bedside Disposition Plan: To be determined Consults called: Podiatry Admission status: Inpatient  Norval Morton MD [facility] Pager [phone]   If 7PM-7AM, please contact night-coverage [email or web address] Password [facility]  09/28/2019, 3:23 PM

## 2019-09-28 NOTE — ED Provider Notes (Signed)
[facility] EMERGENCY DEPARTMENT Provider Note   CSN: [id number] Arrival date & time: 09/28/19  1133     History Chief Complaint  Patient presents with  . foot infection    [name] is a 34 y.o. male.  HPI Patient is a 34 year old male with a history of diabetes mellitus who presents due to chronic wound of the right foot.  Patient states he initially had a callus removed along the right foot.  He did not have regular follow-up with his podiatrist at that time.  About 2 weeks ago he began experiencing worsening swelling and purulent discharge from the wound on the right foot.  He was evaluated by an urgent care 2 days ago.  He states that he was placed on doxycycline and given immediate follow-up with podiatry.  He then saw a podiatrist who sent him to the emergency department today for further workup. Patient reports associated significant edema in the right foot and right lower extremity.  Pain overlying the wound but otherwise nontender.  No fevers, chills, chest pain, shortness of breath, nausea, vomiting.  H&P from podiatry at patient's visit earlier today: -Treatment options discussed including all alternatives, risks, and complications -Etiology of symptoms were discussed -X-rays were obtained and reviewed with the patient.  On the right side no definitive osteomyelitis but there is swelling present in the fifth metatarsal head medially concerning for possible osteomyelitis for my opinion.  No soft tissue emphysema.  On the left side.  Partial fifth ray amputation.  Edema present to lateral aspect there is remodeling of the distal fragment fifth metatarsal.  No obvious signs of acute osteomyelitis. Consider MRI bilateral feet.  -I did debride the wounds bilaterally today utilizing the 312 with scalpel to remove nonviable tissue.  Small mount of purulence was identified from the right side.  This was cultured today. -Concerned about deeper infection.  Is also a  Saturday and I am concerned with him going home over the weekend without further work-up.  I recommend him to the emergency department for further evaluation.  I called to the emergency department to alert them of his arrival.      Past Medical History:  Diagnosis Date  . Diabetes mellitus     Patient Active Problem List   Diagnosis Date Noted  . Severe nonproliferative diabetic retinopathy of right eye, with macular edema, associated with type 1 diabetes mellitus ([place]) 07/18/2019  . Severe nonproliferative diabetic retinopathy of left eye, with macular edema, associated with type 1 diabetes mellitus ([place]) 07/18/2019  . Diabetic cataract ([place]) 07/18/2019  . Osteomyelitis of ankle or foot, acute, left ([place]) 03/11/2019  . Pneumonia due to severe acute respiratory syndrome coronavirus 2 (SARS-CoV-2) 02/12/2019  . Pneumonia due to COVID-19 virus 02/07/2019  . Acute respiratory failure with hypoxia ([place]) 02/07/2019  . Uncontrolled type 1 diabetes mellitus with hyperglycemia ([place]) 02/07/2019  . COVID-19 virus infection 02/04/2019  . Sepsis ([place]) 02/03/2019  . Type 1 diabetes mellitus with diabetic cataract ([place]) 02/03/2019  . AKI (acute kidney injury) ([place]) 02/03/2019  . CAP (community acquired pneumonia) 06/28/2017  . 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 Hyperglycemia without ketosis 01/19/2013    Past Surgical History:  Procedure Laterality Date  . 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 NO PAST SURGERIES         Family  History  Problem Relation Age of Onset  . Hypertension Other   . Diabetes Other   . Healthy Mother     Social History   Tobacco Use  . Smoking status: Never Smoker  . Smokeless tobacco: Never Used  Vaping Use  . Vaping Use: Never used  Substance Use Topics  . Alcohol use: No  . Drug use:  No    Home Medications Prior to Admission medications   Medication Sig Start Date End Date Taking? Authorizing Provider  acetaminophen (TYLENOL) 500 MG tablet Take 500-1,000 mg by mouth every 6 (six) hours as needed for mild pain, fever or headache.     [provider]  Continuous Blood Gluc Sensor (FREESTYLE LIBRE 14 DAY SENSOR) MISC Inject 1 patch into the skin every 14 (fourteen) days.    [provider]  Continuous Blood Gluc Sensor MISC 1 each by Does not apply route as directed. Use as directed every 14 days. May dispense FreeStyle [employer] or similar. 06/30/17   Kayleen Memos, DO  doxycycline (VIBRAMYCIN) 100 MG capsule Take 1 capsule (100 mg total) by mouth 2 (two) times daily. 09/26/19   Vanessa Kick, MD  insulin aspart (NOVOLOG) 100 UNIT/ML injection Inject 0-15 Units into the skin 3 (three) times daily with meals. Patient taking differently: Inject 7-12 Units into the skin 3 (three) times daily as needed (before meals, if BGL is 250 or greater).  07/01/17   Cherene Altes, MD  insulin glargine (LANTUS SOLOSTAR) 100 UNIT/ML Solostar Pen Inject 40 Units into the skin daily before breakfast. 08/20/19   Blanchie Dessert, MD  metoCLOPramide (REGLAN) 10 MG tablet Take 1 tablet (10 mg total) by mouth every 6 (six) hours as needed for nausea or vomiting. 08/20/19   Blanchie Dessert, MD  ondansetron (ZOFRAN ODT) 4 MG disintegrating tablet Take 1 tablet (4 mg total) by mouth every 8 (eight) hours as needed for nausea or vomiting. 08/21/19   Tedd Sias, PA    Allergies    Basaglar kwikpen [insulin glargine], Metformin and related, and Trulicity [dulaglutide]  Review of Systems   Review of Systems  Constitutional: Negative for chills and fever.  Respiratory: Negative for shortness of breath.   Cardiovascular: Positive for leg swelling. Negative for chest pain.  Gastrointestinal: Negative for nausea and vomiting.  Musculoskeletal: Positive for myalgias.    Skin: Positive for wound.   Physical Exam Updated Vital Signs BP 128/70 (BP Location: Right Arm)   Pulse 94   Temp 98.3 F (36.8 C) (Oral)   Resp 16   Ht 6' 5"  (1.956 m)   Wt 117.9 kg   SpO2 99%   BMI 30.83 kg/m   Physical Exam Vitals and nursing note reviewed.  Constitutional:      General: He is in acute distress.     Appearance: Normal appearance. He is normal weight. He is not ill-appearing, toxic-appearing or diaphoretic.  HENT:     Head: Normocephalic and atraumatic.     Right Ear: External ear normal.     Left Ear: External ear normal.     Nose: Nose normal.     Mouth/Throat:     Mouth: Mucous membranes are moist.     Pharynx: Oropharynx is clear. No oropharyngeal exudate or posterior oropharyngeal erythema.  Eyes:     Extraocular Movements: Extraocular movements intact.  Cardiovascular:     Rate and Rhythm: Normal rate and regular rhythm.     Pulses: Normal pulses.  Heart sounds: Normal heart sounds. No murmur heard.  No friction rub. No gallop.      Comments: Palpable DP pulses noted bilaterally. Pulmonary:     Effort: Pulmonary effort is normal. No respiratory distress.     Breath sounds: Normal breath sounds. No stridor. No wheezing, rhonchi or rales.  Abdominal:     General: Abdomen is flat.     Tenderness: There is no abdominal tenderness.  Musculoskeletal:        General: Normal range of motion.     Cervical back: Normal range of motion and neck supple. No tenderness.     Right lower leg: Edema present.  Skin:    General: Skin is warm and dry.     Comments: Chronic wounds noted to both feet.  Please see images below.  2-3+ pitting edema noted in the right lower extremity through the foot and pretibial region.  Amputation noted of the left fifth toe.  Neurological:     General: No focal deficit present.     Mental Status: He is alert and oriented to person, place, and time.  Psychiatric:        Mood and Affect: Mood normal.        Behavior:  Behavior normal.         ED Results / Procedures / Treatments   Labs (all labs ordered are listed, but only abnormal results are displayed) Labs Reviewed  COMPREHENSIVE METABOLIC PANEL - Abnormal; Notable for the following components:      Result Value   Glucose, Bld 215 (*)    Albumin 2.8 (*)    AST 12 (*)    All other components within normal limits  CBC WITH DIFFERENTIAL/PLATELET - Abnormal; Notable for the following components:   RBC 4.03 (*)    Hemoglobin 10.8 (*)    HCT 34.9 (*)    All other components within normal limits  SARS CORONAVIRUS 2 BY RT PCR (HOSPITAL ORDER, [facility])  CULTURE, BLOOD (ROUTINE X 2)  CULTURE, BLOOD (ROUTINE X 2)  LACTIC ACID, PLASMA  PROTIME-INR  SEDIMENTATION RATE  C-REACTIVE PROTEIN  HEMOGLOBIN A1C  HIV ANTIBODY (ROUTINE TESTING W REFLEX)  PREALBUMIN   EKG None  Radiology DG Foot Complete Right  Result Date: 09/28/2019 CLINICAL DATA:  Chronic wound and soft tissue swelling lateral to the 5th MTP joint on the right. Diabetes. EXAM: RIGHT FOOT COMPLETE - 3+ VIEW COMPARISON:  Earlier today elsewhere. FINDINGS: Shallow, elongated soft tissue defect in the lateral aspect of the distal foot. No soft tissue gas, bone destruction or periosteal reaction. No fractures are seen. IMPRESSION: Shallow, elongated soft tissue defect in the lateral aspect of the distal foot without evidence of underlying osteomyelitis. Electronically Signed   By: Claudie Revering M.D.   On: 09/28/2019 14:23   Procedures Procedures   Medications Ordered in ED Medications  sodium chloride flush (NS) 0.9 % injection 3 mL (has no administration in time range)   ED Course  I have reviewed the triage vital signs and the nursing notes.  Pertinent labs & imaging results that were available during my care of the patient were reviewed by me and considered in my medical decision making (see chart for details).    MDM Rules/Calculators/A&P                           Patient is a 34 year old African-American male with history of diabetes mellitus that presents  due to chronic wounds on his bilateral feet, right greater than left.  Patient additionally has significant edema in the right foot and right lower extremity.  He was recently started on doxycycline and was then evaluated by a podiatrist earlier today who sent him to the emergency department for further work-up.  Please see my note above regarding details.  Initial labs show hyperglycemia of 215 as well as a slightly decreased hemoglobin at 10.8. No elevation of lactic acid. PT INR is normal. Patient not tachycardic. Saturating well. Afebrile. No leukocytosis. I obtained a Doppler of the right lower extremity which was negative for DVT. X-ray of the right foot does not show an osteomyelitis. ESR pending.  Due to the nature of the patient's symptoms, his medical history, evaluation by podiatry, will plan on speaking with [facility] hospitalist regarding this patient for a possible admission for IV antibiotics as well as MRI of feet.  I spoke to hospitalist regarding patient who recommends that we go ahead and order MRI of bilateral feet. I will do so. Hospitalist is planning on discussing patient with his podiatrist and will admit patient for IV antibiotics and further management.  Note: Portions of this report may have been transcribed using voice recognition software. Every effort was made to ensure accuracy; however, inadvertent computerized transcription errors may be present.    Final Clinical Impression(s) / ED Diagnoses Final diagnoses:  Diabetic foot ulcer associated with type 1 diabetes mellitus, unspecified laterality, unspecified part of foot, unspecified ulcer stage ([place])  Leg swelling  Cellulitis of right lower extremity   Rx / DC Orders ED Discharge Orders    None       Rayna Sexton, PA-C 09/28/19 1614    Davonna Belling, MD 09/28/19 2011

## 2019-09-28 NOTE — TOC Initial Note (Signed)
Transition of Care [facility]) - Initial/Assessment Note    Patient Details  Name: [name] MRN: [id number] Date of Birth: Feb 26, 1986  Transition of Care [facility] CM/SW Contact:    Lockie Pares, RN Phone Number: 09/28/2019, 5:48 PM  Clinical Narrative:                 Admitted today for worsening foot infection. Has already had fifth toe amputation. Was seeing the podiatrist routinely for this issue. History of type I DM. Poorly controlled.Takes [place] and [place]. Will likely need debridement, possible surgical intervention, initial xray  Shows no osteomyelitis, however will need MRI. . May need Wound Vac will be following for continued plan  Expected Discharge Plan: Home w Home Health Services Barriers to Discharge: Continued Medical Work up   Patient Goals and CMS Choice        Expected Discharge Plan and Services Expected Discharge Plan: Home w Home Health Services                                              Prior Living Arrangements/Services                       Activities of Daily Living      Permission Sought/Granted                  Emotional Assessment       Orientation: : Oriented to Self, Oriented to Place, Oriented to  Time, Oriented to Situation   Psych Involvement: No (comment)  Admission diagnosis:  Diabetic foot infection (HCC) [U13.244, L08.9] Patient Active Problem List   Diagnosis Date Noted  . Diabetic foot infection (HCC) 09/28/2019  . Normocytic anemia 09/28/2019  . 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 Severe nonproliferative diabetic retinopathy of left eye, with macular edema, associated with type 1 diabetes mellitus (HCC) 07/18/2019  . Diabetic cataract (HCC) 07/18/2019  . Osteomyelitis of ankle or foot, acute, left (HCC) 03/11/2019  . Pneumonia due to severe acute respiratory syndrome coronavirus 2  (SARS-CoV-2) 02/12/2019  . Pneumonia due to COVID-19 virus 02/07/2019  . Acute respiratory failure with hypoxia (HCC) 02/07/2019  . Uncontrolled type 1 diabetes mellitus with hyperglycemia (HCC) 02/07/2019  . COVID-19 virus infection 02/04/2019  . Sepsis (HCC) 02/03/2019  . 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 AKI (acute kidney injury) (HCC) 02/03/2019  . CAP (community acquired pneumonia) 06/28/2017  . 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 Hyperglycemia without ketosis 01/19/2013   PCP:  [name], MD Pharmacy:   [id number] [place], [facility] - [phone] [place] [address] Phone: [phone] Fax: [phone]     Social Determinants of Health (SDOH) Interventions    Readmission Risk Interventions No flowsheet data found.

## 2019-09-28 NOTE — Progress Notes (Addendum)
Subjective:   Patient ID: [name], male   DOB: 34 y.o.   MRN: [id number]   HPI 34 year old male presents the office today for concerns of bilateral foot ulcerations.  On the left side he previously had an amputation, partial fifth ray performed by Dr. Victorino Dike.  He states this eventually healed and he states about 2 to 3 days ago he noticed some callus skin, often he had some clear fluid coming from the area 2 days ago and then pus yesterday.  Also has a wound on the right foot which seems to be ongoing since April.  He has previously been seen by another podiatrist for this.  He has about 3 weeks ago he noticed the wound worsening.  He has been soaking his foot.  He has noticed swelling to the right leg and foot.  He is not sure how the wound is doing as he cannot see it.  He did go to urgent care and was given doxycycline which he still is taking.  No x-rays or labs were performed.  Systemically he feels well denies any fevers, chills, nausea, vomiting.  Review of Systems  All other systems reviewed and are negative.  Past Medical History:  Diagnosis Date  . Diabetes mellitus     Past Surgical History:  Procedure Laterality Date  . 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 NO PAST SURGERIES      No current facility-administered medications for this visit.  Current Outpatient Medications:  .  acetaminophen (TYLENOL) 500 MG tablet, Take 500-1,000 mg by mouth every 6 (six) hours as needed for mild pain, fever or headache. , Disp: , Rfl:  .  Continuous Blood Gluc Sensor (FREESTYLE LIBRE 14 DAY SENSOR) MISC, Inject 1 patch into the skin every 14 (fourteen) days., Disp: , Rfl:  .  Continuous Blood Gluc Sensor MISC, 1 each by Does not apply route as directed. Use as directed every 14 days. May dispense FreeStyle Libre Sensor System or similar., Disp: 2 each,  Rfl: 0 .  doxycycline (VIBRAMYCIN) 100 MG capsule, Take 1 capsule (100 mg total) by mouth 2 (two) times daily., Disp: 20 capsule, Rfl: 0 .  insulin aspart (NOVOLOG) 100 UNIT/ML injection, Inject 0-15 Units into the skin 3 (three) times daily with meals. (Patient taking differently: Inject 7-12 Units into the skin 3 (three) times daily as needed (before meals, if BGL is 250 or greater). ), Disp: 10 mL, Rfl: 1 .  insulin glargine (LANTUS SOLOSTAR) 100 UNIT/ML Solostar Pen, Inject 40 Units into the skin daily before breakfast., Disp: 15 mL, Rfl: 2 .  metoCLOPramide (REGLAN) 10 MG tablet, Take 1 tablet (10 mg total) by mouth every 6 (six) hours as needed for nausea or vomiting., Disp: 30 tablet, Rfl: 0 .  ondansetron (ZOFRAN ODT) 4 MG disintegrating tablet, Take 1 tablet (4 mg total) by mouth every 8 (eight) hours as needed for nausea or vomiting., Disp: 20 tablet, Rfl: 0  Facility-Administered Medications Ordered in Other Visits:  .  sodium chloride flush (NS) 0.9 % injection 3 mL, 3 mL, Intravenous, Once, Gwyneth Sprout, MD  Allergies  Allergen Reactions  . Basaglar Stephanie Coup [Insulin Glargine] Diarrhea  . Metformin And Related Diarrhea and Nausea And Vomiting  . Trulicity [Dulaglutide] Diarrhea        Objective:  Physical Exam  General: AAO x3, NAD  Dermatological: Ulcerations present  bilaterally.  In the left foot along the lateral aspect at the fifth metatarsal base is a hyperkeratotic lesion with some blister formation present.  There is drainage coming from the area does appear to be clear to bloody.  There is localized fluid and concern for possible abscess.  I was able to drain the abscess today.  In the right side submetatarsal 5/lateral aspect is a wound with epidermal lysis and is able to express purulence coming from this area.  Small incision is made in purulence was identified as well as bloody drainage.  There is no probing to bone.  There is significant edema to the right lower  extremity compared to the left lower extremity.  There is increase in warmth bilaterally with right side worse than left.  Vascular: Dorsalis Pedis artery and Posterior Tibial artery pedal pulses are palpable bilateral   Neruologic: Sensation absent with Thornell Mule monofilament  Musculoskeletal: Presents wearing surgical shoes.  Muscular strength 5/5 in all groups tested bilateral.  Gait: Unassisted, Nonantalgic.       Assessment:   34 year old male with bilateral foot infections right side worse than left    Plan:  -Treatment options discussed including all alternatives, risks, and complications -Etiology of symptoms were discussed -X-rays were obtained and reviewed with the patient.  On the right side no definitive osteomyelitis but there is swelling present in the fifth metatarsal head medially concerning for possible osteomyelitis for my opinion.  No soft tissue emphysema.  On the left side.  Partial fifth ray amputation.  Edema present to lateral aspect there is remodeling of the distal fragment fifth metatarsal.  No obvious signs of acute osteomyelitis. Consider MRI bilateral feet.  -I did debride the wounds bilaterally today utilizing the 312 with scalpel to remove nonviable tissue.  Small mount of purulence was identified from the right side.  This was cultured today. -Concerned about deeper infection.  Is also a Saturday and I am concerned with him going home over the weekend without further work-up.  I recommend him to the emergency department for further evaluation.  I called to the emergency department to alert them of his arrival.    Trula Slade DPM

## 2019-09-28 NOTE — Progress Notes (Signed)
Patient has bilateral diabetic foot ulcer with clear mucous-like drainage. Wounds are covered with petroleum gauze, top with 4x4 and wrapped with kerlex. WOC also consulted. Pt denies any pain with decreased sensation on both feet.

## 2019-09-28 NOTE — ED Notes (Signed)
Vas . [name] at bed side for test

## 2019-09-28 NOTE — ED Notes (Signed)
Pt reports his BS is 141 with his home meter he scanned on his RT arm. Pt reports since he started the Doxycycline his BS have been lower.

## 2019-09-28 NOTE — ED Triage Notes (Signed)
Pt reports infection to R foot x 2 weeks.  States he saw podiatrist today and was told to come to ED due to worsening infection.  Denies fever and chills.

## 2019-09-28 NOTE — Progress Notes (Signed)
Right lower extremity venous duplex has been completed. Preliminary results can be found in CV Proc through chart review.  Results were given to Dr. Rubin Payor.  09/28/19 2:54 PM Olen Cordial RVT

## 2019-09-29 ENCOUNTER — Inpatient Hospital Stay (HOSPITAL_COMMUNITY): Payer: BC Managed Care – PPO

## 2019-09-29 DIAGNOSIS — L97529 Non-pressure chronic ulcer of other part of left foot with unspecified severity: Secondary | ICD-10-CM

## 2019-09-29 LAB — GLUCOSE, CAPILLARY
Glucose-Capillary: 208 mg/dL — ABNORMAL HIGH (ref 70–99)
Glucose-Capillary: 208 mg/dL — ABNORMAL HIGH (ref 70–99)
Glucose-Capillary: 134 mg/dL — ABNORMAL HIGH (ref 70–99)
Glucose-Capillary: 270 mg/dL — ABNORMAL HIGH (ref 70–99)

## 2019-09-29 LAB — BASIC METABOLIC PANEL
Anion gap: 8 (ref 5–15)
BUN: 11 mg/dL (ref 6–20)
CO2: 31 mmol/L (ref 22–32)
Calcium: 9.2 mg/dL (ref 8.9–10.3)
Chloride: 98 mmol/L (ref 98–111)
Creatinine, Ser: 0.93 mg/dL (ref 0.61–1.24)
GFR calc Af Amer: >60 mL/min (ref >60)
GFR calc non Af Amer: >60 mL/min (ref >60)
Glucose, Bld: 214 mg/dL — ABNORMAL HIGH (ref 70–99)
Potassium: 4.3 mmol/L (ref 3.5–5.1)
Sodium: 137 mmol/L (ref 135–145)

## 2019-09-29 LAB — CBC
HCT: 32.4 % — ABNORMAL LOW (ref 39.0–52.0)
Hemoglobin: 10.1 g/dL — ABNORMAL LOW (ref 13.0–17.0)
MCH: 26.7 pg (ref 26.0–34.0)
MCHC: 31.2 g/dL (ref 30.0–36.0)
MCV: 85.7 fL (ref 80.0–100.0)
Platelets: 285 K/uL (ref 150–400)
RBC: 3.78 MIL/uL — ABNORMAL LOW (ref 4.22–5.81)
RDW: 11.9 % (ref 11.5–15.5)
WBC: 5.4 K/uL (ref 4.0–10.5)
nRBC: 0 % (ref 0.0–0.2)

## 2019-09-29 MED ORDER — VANCOMYCIN HCL 10 G IV SOLR
2500.0000 mg | Freq: Once | INTRAVENOUS | Status: AC
  Administered 2019-09-29: 2500 mg via INTRAVENOUS
  Filled 2019-09-29: qty 2500

## 2019-09-29 MED ORDER — VANCOMYCIN HCL IN DEXTROSE 1-5 GM/200ML-% IV SOLN
1000.0000 mg | Freq: Three times a day (TID) | INTRAVENOUS | Status: DC
  Filled 2019-09-29: qty 200

## 2019-09-29 MED ORDER — VANCOMYCIN HCL IN DEXTROSE 1-5 GM/200ML-% IV SOLN
1000.0000 mg | Freq: Three times a day (TID) | INTRAVENOUS | Status: DC
  Administered 2019-09-30 – 2019-10-01 (×6): 1000 mg via INTRAVENOUS
  Filled 2019-09-29 (×8): qty 200

## 2019-09-29 MED ORDER — GADOBUTROL 1 MMOL/ML IV SOLN
10.0000 mL | Freq: Once | INTRAVENOUS | Status: AC | PRN
  Administered 2019-09-29: 10 mL via INTRAVENOUS

## 2019-09-29 MED ORDER — INSULIN ASPART 100 UNIT/ML ~~LOC~~ SOLN
0.0000 [IU] | Freq: Three times a day (TID) | SUBCUTANEOUS | Status: DC
  Administered 2019-09-29: 1 [IU] via SUBCUTANEOUS
  Administered 2019-09-29 – 2019-09-30 (×2): 3 [IU] via SUBCUTANEOUS
  Administered 2019-09-30: 2 [IU] via SUBCUTANEOUS
  Administered 2019-09-30: 5 [IU] via SUBCUTANEOUS
  Administered 2019-10-01: 3 [IU] via SUBCUTANEOUS
  Administered 2019-10-01 – 2019-10-02 (×3): 2 [IU] via SUBCUTANEOUS
  Administered 2019-10-02: 1 [IU] via SUBCUTANEOUS
  Administered 2019-10-02 – 2019-10-03 (×2): 2 [IU] via SUBCUTANEOUS
  Administered 2019-10-04: 3 [IU] via SUBCUTANEOUS
  Administered 2019-10-04: 2 [IU] via SUBCUTANEOUS

## 2019-09-29 MED ORDER — INSULIN ASPART 100 UNIT/ML ~~LOC~~ SOLN
0.0000 [IU] | Freq: Every day | SUBCUTANEOUS | Status: DC
  Administered 2019-09-29: 3 [IU] via SUBCUTANEOUS
  Administered 2019-10-01: 2 [IU] via SUBCUTANEOUS

## 2019-09-29 MED ORDER — VANCOMYCIN HCL 10 G IV SOLR
2500.0000 mg | Freq: Once | INTRAVENOUS | Status: DC
  Filled 2019-09-29: qty 2500

## 2019-09-29 NOTE — Progress Notes (Signed)
Pt ordered to have vanc at 1130, MRI called for a STAT order at 11 informing me that the pt was next in line for their MRI. MRI said that they needed pt to be SL, so I held vanc dose and called pharmacy. Pharmacy is aware of late vanc dose and requested that I call them back when pt returns to hang the vanc.   1300- I called MRI back to check schedule, pt is still next in line to go down. I will continue to hold vanc until pt returns and check in with the pharmacy when they return.

## 2019-09-29 NOTE — Plan of Care (Signed)

## 2019-09-29 NOTE — Progress Notes (Addendum)
Pharmacy Antibiotic Note  [name] is a 34 y.o. male admitted on 09/28/2019 with diabetic foot infection, suspected osteomyelitis.   Wbc wnl, increased CRP at 5.0, lactate 0.8, Scr great at 0.93, patient's HR, and RR WNL, patient is afebrile. Patient failed outpatient antibiotics. Patient is now on ceftriaxone and metronidazole per MD team. Pharmacy has been consulted for vancomycin dosing.  Plan: Start Vancomycin 2500mg  IV x1 (loading dose), then vancomycin 1g IV every 8 hours per nomogram, vanc trough goal: 15-20 Follow-up signs/symptoms of infection, clinical improvement, de-escalation plan, LOT, vanc trough at steady state  Height: 6\' 5"  (195.6 cm) Weight: 117.9 kg (260 lb) IBW/kg (Calculated) : 89.1  Temp (24hrs), Avg:98.3 F (36.8 C), Min:97.9 F (36.6 C), Max:98.9 F (37.2 C)  Recent Labs  Lab 09/28/19 1151 09/29/19 0506  WBC 6.5 5.4  CREATININE 1.02 0.93  LATICACIDVEN 0.8  --     Estimated Creatinine Clearance: 160.8 mL/min (by C-G formula based on SCr of 0.93 mg/dL).    Allergies  Allergen Reactions  . Basaglar 09/30/19 [Insulin Glargine] Diarrhea  . Metformin And Related Diarrhea and Nausea And Vomiting  . Trulicity [Dulaglutide] Diarrhea    Antimicrobials this admission: 6/26 ctx  >>  6/26 metronidzole >>  6/27 vancomycin >>  Dose adjustments this admission: n/a  Microbiology results: 6/26 BCx: NGTD x12hours covid neg    Thank you for the interesting consult and for involving pharmacy in this patient's care.  7/27, PharmD, BCPS 09/29/2019 10:28 AM PGY-2 Pharmacy Administration Resident Please check [email or web address] for unit-specific pharmacist phone numbers

## 2019-09-29 NOTE — Progress Notes (Signed)
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 Doran Durand in February, came in from podiatry clinic for worsening wounds of the right foot.  He was seen by Dr. Jacqualyn Posey and sent to the ER.  He has been on oral antibiotics with doxycycline since 6/24 and fail to improve.  The wounds were debrided briefly in the office with pus drainage was appreciated, cultures were obtained.  Subjective / 24h Interval events: Doing well this morning, denies any fever or chills.  Awaiting MRI  Assessment & Plan: Principal Problem Diabetic foot infection-acute, failed outpatient antibiotics.  He has now been placed on intravenous antibiotics and obtain an MRI of the feet to evaluate for osteomyelitis/deeper infection.  Appreciate podiatry follow-up.  X-ray was not concerning for osteomyelitis however his ESR was also quite elevated.  I will add vancomycin until we get more culture data  Active Problems Type 1 diabetes mellitus, uncontrolled, with hyperglycemia-continue Lantus and sliding scale.  A1c during this admission was found to be 11.7  Normocytic anemia-possibly related to ongoing active infection.  No bleeding.  Hemoglobin stable.  Scheduled Meds: . enoxaparin (LOVENOX) injection  40 mg Subcutaneous Q24H  . insulin aspart  0-5 Units Subcutaneous QHS  . insulin aspart  0-9 Units Subcutaneous TID WC  . insulin glargine  40 Units Subcutaneous QAC breakfast  . metroNIDAZOLE  500 mg Oral Q8H  . sodium chloride flush  3 mL Intravenous Once   Continuous Infusions: . cefTRIAXone (ROCEPHIN)  IV 2 g (09/28/19 1807)   PRN Meds:.acetaminophen **OR** acetaminophen, albuterol, HYDROcodone-acetaminophen, ondansetron **OR** ondansetron  (ZOFRAN) IV  Diet Orders (From admission, onward)    Start     Ordered   09/28/19 1603  Diet Carb Modified Fluid consistency: Thin; Room service appropriate? Yes  Diet effective now       Question Answer Comment  Diet-HS Snack? Nothing   Calorie Level Medium 1600-2000   Fluid consistency: Thin   Room service appropriate? Yes      09/28/19 1606          DVT prophylaxis: enoxaparin (LOVENOX) injection 40 mg Start: 09/28/19 1800     Code Status: Full Code  Family Communication: no family at bedside   Status is: Inpatient  Remains inpatient appropriate because:Ongoing diagnostic testing needed not appropriate for outpatient work up, IV treatments appropriate due to intensity of illness or inability to take PO and Inpatient level of care appropriate due to severity of illness   Dispo:  Patient From: Home  Planned Disposition: Home  Expected discharge date: 10/02/19  Medically stable for discharge: No  Consultants:  Podiatry   Procedures:  None  Microbiology  Wound cultures 6/26 in office - pending   Antimicrobials: Vancomycin 6/27 >> Ceftriaxone 6/26>> Metronidazole 6/26 >>    Objective: Vitals:   09/28/19 1839 09/28/19 2006 09/29/19 0027 09/29/19 0457  BP: 129/81 139/84 121/70 113/75  Pulse: 86 83 78 73  Resp: _0 Temp: 98.4 F (36.9 C) 98 F (36.7 C) 97.9 F (36.6 C) 98 F (36.7 C)  TempSrc: Oral Oral Oral Oral  SpO2: 100% 97% 97% 100%  Weight:      Height:        Intake/Output Summary (Last 24 hours) at 09/29/2019 [phone]  Last data filed at 09/29/2019 0500 Gross per 24 hour  Intake --  Output 1050 ml  Net -1050 ml   Filed Weights   09/28/19 1135  Weight: 117.9 kg    Examination:  Constitutional: NAD Eyes: no scleral icterus ENMT: Mucous membranes are moist.  Neck: normal, supple Respiratory: clear to auscultation bilaterally, no wheezing, no crackles.  Cardiovascular: Regular rate and rhythm, no murmurs / rubs / gallops.   Abdomen: non distended, no tenderness. Bowel sounds positive.  Musculoskeletal: no clubbing / cyanosis.  Skin: no rashes, bilateral feet Ace wrap Neurologic: CN 2-12 grossly intact. Strength 5/5 in all 4.  Psychiatric: Normal judgment and insight. Alert and oriented x 3. Normal mood.   Data Reviewed: I have independently reviewed following labs and imaging studies   CBC: Recent Labs  Lab 09/28/19 1151 09/29/19 0506  WBC 6.5 5.4  NEUTROABS 3.8  --   HGB 10.8* 10.1*  HCT 34.9* 32.4*  MCV 86.6 85.7  PLT 328 854   Basic Metabolic Panel: Recent Labs  Lab 09/28/19 1151 09/29/19 0506  NA 136 137  K 4.6 4.3  CL 98 98  CO2 28 31  GLUCOSE 215* 214*  BUN 10 11  CREATININE 1.02 0.93  CALCIUM 9.8 9.2   Liver Function Tests: Recent Labs  Lab 09/28/19 1151  AST 12*  ALT 13  ALKPHOS 77  BILITOT 0.4  PROT 8.0  ALBUMIN 2.8*   Coagulation Profile: Recent Labs  Lab 09/28/19 1420  INR 1.1   HbA1C: Recent Labs    09/28/19 1916  HGBA1C 11.7*   CBG: Recent Labs  Lab 09/28/19 2050 09/29/19 0755  GLUCAP 257* 208*    Recent Results (from the past 240 hour(s))  SARS Coronavirus 2 by RT PCR (hospital order, performed in [facility] hospital lab) Nasopharyngeal Nasopharyngeal Swab     Status: None   Collection Time: 09/28/19  3:14 PM   Specimen: Nasopharyngeal Swab  Result Value Ref Range Status   SARS Coronavirus 2 NEGATIVE NEGATIVE Final    Comment: (NOTE) SARS-CoV-2 target nucleic acids are NOT DETECTED.  The SARS-CoV-2 RNA is generally detectable in upper and lower respiratory specimens during the acute phase of infection. The lowest concentration of SARS-CoV-2 viral copies this assay can detect is 250 copies / mL. A negative result does not preclude SARS-CoV-2 infection and should not be used as the sole basis for treatment or other patient management decisions.  A negative result may occur with improper specimen collection / handling, submission of specimen  other than nasopharyngeal swab, presence of viral mutation(s) within the areas targeted by this assay, and inadequate number of viral copies (<250 copies / mL). A negative result must be combined with clinical observations, patient history, and epidemiological information.  Fact Sheet for Patients:   StrictlyIdeas.no  Fact Sheet for Healthcare Providers: BankingDealers.co.za  This test is not yet approved or  cleared by the [place] FDA and has been authorized for detection and/or diagnosis of SARS-CoV-2 by FDA under an Emergency Use Authorization (EUA).  This EUA will remain in effect (meaning this test can be used) for the duration of the COVID-19 declaration under Section 564(b)(1) of the Act, 21 U.S.C. section 360bbb-3(b)(1), unless the authorization is terminated or revoked sooner.  Performed at [facility], [place] [address] [place], [place] [address]   Blood Cultures x 2 sites     Status: None (Preliminary result)   Collection Time: 09/28/19  7:16 PM   Specimen: BLOOD  Result Value Ref Range Status   Specimen Description BLOOD RIGHT ANTECUBITAL  Final   Special Requests   Final    BOTTLES DRAWN AEROBIC AND ANAEROBIC Blood Culture adequate volume   Culture   Final    NO GROWTH < 12 HOURS Performed at [facility], [address], [place], [place] [address]    Report Status PENDING  Incomplete  Blood Cultures x 2 sites     Status: None (Preliminary result)   Collection Time: 09/28/19  7:24 PM   Specimen: BLOOD RIGHT FOREARM  Result Value Ref Range Status   Specimen Description BLOOD RIGHT FOREARM  Final   Special Requests   Final    BOTTLES DRAWN AEROBIC AND ANAEROBIC Blood Culture adequate volume   Culture   Final    NO GROWTH < 12 HOURS Performed at [facility], [place] [address], [place], [place] [address]    Report Status PENDING  Incomplete     Radiology Studies: DG Foot Complete  Right  Result Date: 09/28/2019 CLINICAL DATA:  Chronic wound and soft tissue swelling lateral to the 5th MTP joint on the right. Diabetes. EXAM: RIGHT FOOT COMPLETE - 3+ VIEW COMPARISON:  Earlier today elsewhere. FINDINGS: Shallow, elongated soft tissue defect in the lateral aspect of the distal foot. No soft tissue gas, bone destruction or periosteal reaction. No fractures are seen. IMPRESSION: Shallow, elongated soft tissue defect in the lateral aspect of the distal foot without evidence of underlying osteomyelitis. Electronically Signed   By: Claudie Revering M.D.   On: 09/28/2019 14:23   VAS [name] LOWER EXTREMITY VENOUS (DVT) (ONLY [facility] & [facility])  Result Date: 09/28/2019  Lower Venous DVTStudy Indications: Edema, and ulceration.  Risk Factors: Surgery 05/16/2019 - Left 5th ray amputation (Left Fifth Toe). Limitations: Poor ultrasound/tissue interface. Comparison Study: No prior studies. Performing Technologist: Oliver Hum RVT  Examination Guidelines: A complete evaluation includes B-mode imaging, spectral Doppler, color Doppler, and power Doppler as needed of all accessible portions of each vessel. Bilateral testing is considered an integral part of a complete examination. Limited examinations for reoccurring indications may be performed as noted. The reflux portion of the exam is performed with the patient in reverse Trendelenburg.  +---------+---------------+---------+-----------+----------+--------------+ RIGHT    CompressibilityPhasicitySpontaneityPropertiesThrombus Aging +---------+---------------+---------+-----------+----------+--------------+ CFV      Full           Yes      Yes                                 +---------+---------------+---------+-----------+----------+--------------+ SFJ      Full                                                        +---------+---------------+---------+-----------+----------+--------------+ FV Prox  Full                                                         +---------+---------------+---------+-----------+----------+--------------+ FV Mid   Full                                                        +---------+---------------+---------+-----------+----------+--------------+  FV DistalFull           Yes      Yes                                 +---------+---------------+---------+-----------+----------+--------------+ PFV      Full                                                        +---------+---------------+---------+-----------+----------+--------------+ POP      Full           Yes      Yes                                 +---------+---------------+---------+-----------+----------+--------------+ PTV      Full                                                        +---------+---------------+---------+-----------+----------+--------------+ PERO     Full                                                        +---------+---------------+---------+-----------+----------+--------------+ Multiphasic flow is noted in the right popliteal, posterior tibial, peroneal, and anterior tibial arteries.  +----+---------------+---------+-----------+----------+--------------+ LEFTCompressibilityPhasicitySpontaneityPropertiesThrombus Aging +----+---------------+---------+-----------+----------+--------------+ CFV Full           Yes      Yes                                 +----+---------------+---------+-----------+----------+--------------+     Summary: RIGHT: - There is no evidence of deep vein thrombosis in the lower extremity. However, portions of this examination were limited- see technologist comments above.  - No cystic structure found in the popliteal fossa.  LEFT: - No evidence of common femoral vein obstruction.  *See table(s) above for measurements and observations. Electronically signed by Deitra Mayo MD on 09/28/2019 at [place] PM.    Final    Marzetta Board, MD, PhD [facility]  Between 7 am - 7 pm I am available, please contact me via Amion or Ewa Gentry  Between 7 pm - 7 am I am not available, please contact night coverage MD/APP via Amion

## 2019-09-29 NOTE — Consult Note (Addendum)
WOC Nurse wound consult note Consultation was completed by review of records, images and assistance from the bedside nurse/clinical staff.    Reason for Consult: diabetic foot ulcer Wound type: Neuropathic foot ulcers Pressure Injury POA: NA Measurement: see nursing flowsheets Wound bed: 1. Right lateral foot; 90% yellow/10% pink-pale 2. Right dorsal foot: 100% pale pink 3.  Left lateral foot: 100% pale pink Drainage (amount, consistency, odor) not able to assess no dressings in the patient's images Periwound: intact; evidence of ulceration that has healed on the right plantar surface; hyperkeratotic wound edges Dressing procedure/placement/frequency:  Conservative treatment; pending MRI and follow up by podiatrist  Single layer of xeroform over wounds, top with dry dressing.  Change daily. May need enzymatic debridement will follow up after MRI resulted if podiatrist does not add topical care.    Re consult if needed, will not follow at this time. Thanks  Hilliard Borges [employer], RN,CWOCN, CNS, CWON-AP [phone])

## 2019-09-29 NOTE — Consult Note (Signed)
Reason for Consult: Infection  Referring Physician: Dr. Pamella Pert, MD  [name] is an 34 y.o. male.  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 Victorino Dike in February, was sent from clinic to the ER for worsening wounds of the right foot.  He is an ongoing issue for several months with his right foot.  He recently seen in urgent care was given doxycycline.  Also the postoperative amputation left side healed however last couple days he noticed that the skin starts to come off and some drainage.  Currently denies any pain.  No fevers, chills, nausea, vomiting.  Past Medical History:  Diagnosis Date  . Diabetes mellitus     Past Surgical History:  Procedure Laterality Date  . 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;  Surgeon: Toni Arthurs, MD;  Location: [facility];  Service: Orthopedics;  Laterality: Left;  . NO PAST SURGERIES      Family History  Problem Relation Age of Onset  . Hypertension Other   . Diabetes Other   . Healthy Mother     Social History:  reports that he has never smoked. He has never used smokeless tobacco. He reports that he does not drink alcohol and does not use drugs.  Allergies:  Allergies  Allergen Reactions  . Basaglar Stephanie Coup [Insulin Glargine] Diarrhea  . Metformin And Related Diarrhea and Nausea And Vomiting  . Trulicity [Dulaglutide] Diarrhea    Medications: I have reviewed the patient's current medications.  Results for orders placed or performed during the hospital encounter of 09/28/19 (from the past 48 hour(s))  Lactic acid, plasma     Status: None   Collection Time: 09/28/19 11:51 AM  Result Value Ref Range   Lactic Acid, Venous 0.8 0.5 - 1.9 mmol/L    Comment: Performed at [facility], [address], [place], [place] [address]  Comprehensive metabolic  panel     Status: Abnormal   Collection Time: 09/28/19 11:51 AM  Result Value Ref Range   Sodium 136 135 - 145 mmol/L   Potassium 4.6 3.5 - 5.1 mmol/L   Chloride 98 98 - 111 mmol/L   CO2 28 22 - 32 mmol/L   Glucose, Bld 215 (H) 70 - 99 mg/dL    Comment: Glucose reference range applies only to samples taken after fasting for at least 8 hours.   BUN 10 6 - 20 mg/dL   Creatinine, Ser 0.25 0.61 - 1.24 mg/dL   Calcium 9.8 8.9 - 42.7 mg/dL   Total Protein 8.0 6.5 - 8.1 g/dL   Albumin 2.8 (L) 3.5 - 5.0 g/dL   AST 12 (L) 15 - 41 U/L   ALT 13 0 - 44 U/L   Alkaline Phosphatase 77 38 - 126 U/L   Total Bilirubin 0.4 0.3 - 1.2 mg/dL   GFR calc non Af Amer >60 >60 mL/min   GFR calc Af Amer >60 >60 mL/min   Anion gap 10 5 - 15    Comment: Performed at [facility], [address] [place], [place] [address]  CBC with Differential     Status: Abnormal   Collection Time: 09/28/19 11:51 AM  Result Value Ref Range   WBC 6.5 4.0 - 10.5 K/uL   RBC 4.03 (L) 4.22 - 5.81 MIL/uL   Hemoglobin 10.8 (L) 13.0 - 17.0 g/dL   HCT  34.9 (L) 39 - 52 %   MCV 86.6 80.0 - 100.0 fL   MCH 26.8 26.0 - 34.0 pg   MCHC 30.9 30.0 - 36.0 g/dL   RDW 95.2 84.1 - 32.4 %   Platelets 328 150 - 400 K/uL   nRBC 0.0 0.0 - 0.2 %   Neutrophils Relative % 57 %   Neutro Abs 3.8 1.7 - 7.7 K/uL   Lymphocytes Relative 22 %   Lymphs Abs 1.4 0.7 - 4.0 K/uL   Monocytes Relative 12 %   Monocytes Absolute 0.8 0 - 1 K/uL   Eosinophils Relative 8 %   Eosinophils Absolute 0.5 0 - 0 K/uL   Basophils Relative 1 %   Basophils Absolute 0.0 0 - 0 K/uL   Immature Granulocytes 0 %   Abs Immature Granulocytes 0.02 0.00 - 0.07 K/uL    Comment: Performed at [facility], [address], [place], [place] [address]  Protime-INR     Status: None   Collection Time: 09/28/19  2:20 PM  Result Value Ref Range   Prothrombin Time 14.0 11.4 - 15.2 seconds   INR 1.1 0.8 - 1.2    Comment: (NOTE) INR goal varies based on device and disease  states. Performed at [facility], [address], [place], [place] [address]   SARS Coronavirus 2 by RT PCR (hospital order, performed in [facility] hospital lab) Nasopharyngeal Nasopharyngeal Swab     Status: None   Collection Time: 09/28/19  3:14 PM   Specimen: Nasopharyngeal Swab  Result Value Ref Range   SARS Coronavirus 2 NEGATIVE NEGATIVE    Comment: (NOTE) SARS-CoV-2 target nucleic acids are NOT DETECTED.  The SARS-CoV-2 RNA is generally detectable in upper and lower respiratory specimens during the acute phase of infection. The lowest concentration of SARS-CoV-2 viral copies this assay can detect is 250 copies / mL. A negative result does not preclude SARS-CoV-2 infection and should not be used as the sole basis for treatment or other patient management decisions.  A negative result may occur with improper specimen collection / handling, submission of specimen other than nasopharyngeal swab, presence of viral mutation(s) within the areas targeted by this assay, and inadequate number of viral copies (<250 copies / mL). A negative result must be combined with clinical observations, patient history, and epidemiological information.  Fact Sheet for Patients:   BoilerBrush.com.cy  Fact Sheet for Healthcare Providers: https://pope.com/  This test is not yet approved or  cleared by the [place] FDA and has been authorized for detection and/or diagnosis of SARS-CoV-2 by FDA under an Emergency Use Authorization (EUA).  This EUA will remain in effect (meaning this test can be used) for the duration of the COVID-19 declaration under Section 564(b)(1) of the Act, 21 U.S.C. section 360bbb-3(b)(1), unless the authorization is terminated or revoked sooner.  Performed at [facility], [address] [place], [place] [address]   Sedimentation rate     Status: Abnormal   Collection Time: 09/28/19  7:16 PM  Result  Value Ref Range   Sed Rate 129 (H) 0 - 16 mm/hr    Comment: Performed at [facility], [address], [place], [place] [address]  C-reactive protein     Status: Abnormal   Collection Time: 09/28/19  7:16 PM  Result Value Ref Range   CRP 5.0 (H) <1.0 mg/dL    Comment: Performed at [facility], [address] [place], [place] [address]  Hemoglobin A1c     Status: Abnormal   Collection Time: 09/28/19  7:16 PM  Result Value Ref Range   Hgb A1c MFr Bld 11.7 (H) 4.8 - 5.6 %    Comment: (NOTE) Pre diabetes:          5.7%-6.4%  Diabetes:              >6.4%  Glycemic control for   <7.0% adults with diabetes    Mean Plasma Glucose 289.09 mg/dL    Comment: Performed at [facility] [address] [place], [place] [address]  HIV Antibody (routine testing w rflx)     Status: None   Collection Time: 09/28/19  7:16 PM  Result Value Ref Range   HIV Screen 4th Generation wRfx Non Reactive Non Reactive    Comment: Performed at [facility], [place] [address] [place], [place] [address]  Prealbumin     Status: Abnormal   Collection Time: 09/28/19  7:16 PM  Result Value Ref Range   Prealbumin 11.4 (L) 18 - 38 mg/dL    Comment: Performed at [facility] [address], [place], [place] [address]  Blood Cultures x 2 sites     Status: None (Preliminary result)   Collection Time: 09/28/19  7:16 PM   Specimen: BLOOD  Result Value Ref Range   Specimen Description BLOOD RIGHT ANTECUBITAL    Special Requests      BOTTLES DRAWN AEROBIC AND ANAEROBIC Blood Culture adequate volume   Culture      NO GROWTH < 12 HOURS Performed at [facility], [place] [address], [place], [place] [address]    Report Status PENDING   Blood Cultures x 2 sites     Status: None (Preliminary result)   Collection Time: 09/28/19  7:24 PM   Specimen: BLOOD RIGHT FOREARM  Result Value Ref Range   Specimen Description BLOOD RIGHT FOREARM    Special Requests      BOTTLES DRAWN AEROBIC AND  ANAEROBIC Blood Culture adequate volume   Culture      NO GROWTH < 12 HOURS Performed at [facility], [place] [address] [place], [place] [address]    Report Status PENDING   Glucose, capillary     Status: Abnormal   Collection Time: 09/28/19  8:50 PM  Result Value Ref Range   Glucose-Capillary 257 (H) 70 - 99 mg/dL    Comment: Glucose reference range applies only to samples taken after fasting for at least 8 hours.  CBC     Status: Abnormal   Collection Time: 09/29/19  5:06 AM  Result Value Ref Range   WBC 5.4 4.0 - 10.5 K/uL   RBC 3.78 (L) 4.22 - 5.81 MIL/uL   Hemoglobin 10.1 (L) 13.0 - 17.0 g/dL   HCT 32.4 (L) 39 - 52 %   MCV 85.7 80.0 - 100.0 fL   MCH 26.7 26.0 - 34.0 pg   MCHC 31.2 30.0 - 36.0 g/dL   RDW 11.9 11.5 - 15.5 %   Platelets 285 150 - 400 K/uL   nRBC 0.0 0.0 - 0.2 %    Comment: Performed at [facility], [place] [address], [place], [place] [address]  Basic metabolic panel     Status: Abnormal   Collection Time: 09/29/19  5:06 AM  Result Value Ref Range   Sodium 137 135 - 145 mmol/L   Potassium 4.3 3.5 - 5.1 mmol/L   Chloride 98 98 -  111 mmol/L   CO2 31 22 - 32 mmol/L   Glucose, Bld 214 (H) 70 - 99 mg/dL    Comment: Glucose reference range applies only to samples taken after fasting for at least 8 hours.   BUN 11 6 - 20 mg/dL   Creatinine, Ser 7.06 0.61 - 1.24 mg/dL   Calcium 9.2 8.9 - 23.7 mg/dL   GFR calc non Af Amer >60 >60 mL/min   GFR calc Af Amer >60 >60 mL/min   Anion gap 8 5 - 15    Comment: Performed at [facility], [address] [place], [place] [address]  Glucose, capillary     Status: Abnormal   Collection Time: 09/29/19  7:55 AM  Result Value Ref Range   Glucose-Capillary 208 (H) 70 - 99 mg/dL    Comment: Glucose reference range applies only to samples taken after fasting for at least 8 hours.  Glucose, capillary     Status: Abnormal   Collection Time: 09/29/19 11:44 AM  Result Value Ref Range   Glucose-Capillary 208 (H) 70 -  99 mg/dL    Comment: Glucose reference range applies only to samples taken after fasting for at least 8 hours.    DG Foot Complete Right  Result Date: 09/28/2019 CLINICAL DATA:  Chronic wound and soft tissue swelling lateral to the 5th MTP joint on the right. Diabetes. EXAM: RIGHT FOOT COMPLETE - 3+ VIEW COMPARISON:  Earlier today elsewhere. FINDINGS: Shallow, elongated soft tissue defect in the lateral aspect of the distal foot. No soft tissue gas, bone destruction or periosteal reaction. No fractures are seen. IMPRESSION: Shallow, elongated soft tissue defect in the lateral aspect of the distal foot without evidence of underlying osteomyelitis. Electronically Signed   By: Beckie Salts M.D.   On: 09/28/2019 14:23   VAS [name] LOWER EXTREMITY VENOUS (DVT) (ONLY [facility] & [facility])  Result Date: 09/28/2019  Lower Venous DVTStudy Indications: Edema, and ulceration.  Risk Factors: Surgery 05/16/2019 - Left 5th ray amputation (Left Fifth Toe). Limitations: Poor ultrasound/tissue interface. Comparison Study: No prior studies. Performing Technologist: Chanda Busing RVT  Examination Guidelines: A complete evaluation includes B-mode imaging, spectral Doppler, color Doppler, and power Doppler as needed of all accessible portions of each vessel. Bilateral testing is considered an integral part of a complete examination. Limited examinations for reoccurring indications may be performed as noted. The reflux portion of the exam is performed with the patient in reverse Trendelenburg.  +---------+---------------+---------+-----------+----------+--------------+ RIGHT    CompressibilityPhasicitySpontaneityPropertiesThrombus Aging +---------+---------------+---------+-----------+----------+--------------+ CFV      Full           Yes      Yes                                 +---------+---------------+---------+-----------+----------+--------------+ SFJ      Full                                                         +---------+---------------+---------+-----------+----------+--------------+ FV Prox  Full                                                        +---------+---------------+---------+-----------+----------+--------------+  FV Mid   Full                                                        +---------+---------------+---------+-----------+----------+--------------+ FV DistalFull           Yes      Yes                                 +---------+---------------+---------+-----------+----------+--------------+ PFV      Full                                                        +---------+---------------+---------+-----------+----------+--------------+ POP      Full           Yes      Yes                                 +---------+---------------+---------+-----------+----------+--------------+ PTV      Full                                                        +---------+---------------+---------+-----------+----------+--------------+ PERO     Full                                                        +---------+---------------+---------+-----------+----------+--------------+ Multiphasic flow is noted in the right popliteal, posterior tibial, peroneal, and anterior tibial arteries.  +----+---------------+---------+-----------+----------+--------------+ LEFTCompressibilityPhasicitySpontaneityPropertiesThrombus Aging +----+---------------+---------+-----------+----------+--------------+ CFV Full           Yes      Yes                                 +----+---------------+---------+-----------+----------+--------------+     Summary: RIGHT: - There is no evidence of deep vein thrombosis in the lower extremity. However, portions of this examination were limited- see technologist comments above.  - No cystic structure found in the popliteal fossa.  LEFT: - No evidence of common femoral vein obstruction.  *See table(s) above for measurements and observations.  Electronically signed by Waverly Ferrari MD on 09/28/2019 at 6:46:39 PM.    Final     Review of Systems Blood pressure 113/75, pulse 73, temperature 98 F (36.7 C), temperature source Oral, resp. rate 14, height  (1.956 m), weight 117.9 kg, SpO2 100 %. Physical Exam General: AAO x3, NAD-lying in bed  Dermatological: Wounds present bilaterally.  On the right side macerated tissue with superficial skin breakdown with localized edema present to the fifth metatarsal head.  There is swelling to the right lower extremity with contracture minimal increase in warmth.  Warmth appears somewhat improved compared to yesterday.  On the left foot on  fifth metatarsal base superficial area skin breakdown with localized edema.  There is no crepitation bilaterally.   Vascular: Dorsalis Pedis artery and Posterior Tibial artery pedal pulses are palpable bilateral with immedate capillary fill time. There is no pain with calf compression, swelling, warmth, erythema.   Neruologic: Sensation absent  Musculoskeletal: No pain on exam  Assessment/Plan: Ulcerations bilaterally.  White blood cell count normal and afebrile however sed rate significantly elevated.  Concerning for deeper infection.  Awaiting MRI.  Continue broad-spectrum IV robotics for now.  Await MRI before pursuing more definitive treatment plan.  Elevation.  Recommend arterial studies.  Vivi BarrackMatthew R Niajah Sipos 09/29/2019, 11:54 AM

## 2019-09-30 ENCOUNTER — Telehealth: Payer: Self-pay | Admitting: Podiatry

## 2019-09-30 DIAGNOSIS — L97529 Non-pressure chronic ulcer of other part of left foot with unspecified severity: Secondary | ICD-10-CM

## 2019-09-30 LAB — BASIC METABOLIC PANEL
Anion gap: 7 (ref 5–15)
BUN: 9 mg/dL (ref 6–20)
CO2: 30 mmol/L (ref 22–32)
Calcium: 9.2 mg/dL (ref 8.9–10.3)
Chloride: 100 mmol/L (ref 98–111)
Creatinine, Ser: 0.93 mg/dL (ref 0.61–1.24)
GFR calc Af Amer: >60 mL/min (ref >60)
GFR calc non Af Amer: >60 mL/min (ref >60)
Glucose, Bld: 299 mg/dL — ABNORMAL HIGH (ref 70–99)
Potassium: 4.6 mmol/L (ref 3.5–5.1)
Sodium: 137 mmol/L (ref 135–145)

## 2019-09-30 LAB — CBC
HCT: 31.8 % — ABNORMAL LOW (ref 39.0–52.0)
Hemoglobin: 10 g/dL — ABNORMAL LOW (ref 13.0–17.0)
MCH: 26.8 pg (ref 26.0–34.0)
MCHC: 31.4 g/dL (ref 30.0–36.0)
MCV: 85.3 fL (ref 80.0–100.0)
Platelets: 304 10*3/uL (ref 150–400)
RBC: 3.73 MIL/uL — ABNORMAL LOW (ref 4.22–5.81)
RDW: 11.8 % (ref 11.5–15.5)
WBC: 5.4 10*3/uL (ref 4.0–10.5)
nRBC: 0 % (ref 0.0–0.2)

## 2019-09-30 LAB — GLUCOSE, CAPILLARY
Glucose-Capillary: 253 mg/dL — ABNORMAL HIGH (ref 70–99)
Glucose-Capillary: 210 mg/dL — ABNORMAL HIGH (ref 70–99)
Glucose-Capillary: 159 mg/dL — ABNORMAL HIGH (ref 70–99)

## 2019-09-30 MED ORDER — ENSURE MAX PROTEIN PO LIQD
11.0000 [oz_av] | Freq: Two times a day (BID) | ORAL | Status: DC
  Administered 2019-09-30 – 2019-10-04 (×7): 11 [oz_av] via ORAL
  Filled 2019-09-30 (×10): qty 330

## 2019-09-30 MED ORDER — ADULT MULTIVITAMIN W/MINERALS CH
1.0000 | ORAL_TABLET | Freq: Every day | ORAL | Status: DC
  Administered 2019-10-01 – 2019-10-04 (×4): 1 via ORAL
  Filled 2019-09-30 (×4): qty 1

## 2019-09-30 NOTE — Plan of Care (Signed)
  Problem: Health Behavior/Discharge Planning: Goal: Ability to manage health-related needs will improve Outcome: Progressing   Problem: Clinical Measurements: Goal: Ability to maintain clinical measurements within normal limits will improve Outcome: Progressing   Problem: Activity: Goal: Risk for activity intolerance will decrease Outcome: Progressing   Problem: Nutrition: Goal: Adequate nutrition will be maintained Outcome: Progressing   

## 2019-09-30 NOTE — Progress Notes (Signed)
PROGRESS NOTE  [name] QVZ:[id number] DOB: 28-Aug-1985 DOA: 09/28/2019 PCP: [name], MD   LOS: 2 days   Brief Narrative / Interim history: 34 year old male with history of diabetes mellitus, type I, uncontrolled with hyperglycemia, chronic diabetic foot ulcers status post left partial fifth ray amputation by Dr. Doran Durand in February, came in from podiatry clinic for worsening wounds of the right foot.  He was seen by Dr. Jacqualyn Posey and sent to the ER.  He has been on oral antibiotics with doxycycline since 6/24 and fail to improve.  The wounds were debrided briefly in the office with pus drainage was appreciated, cultures were obtained.  Subjective / 24h Interval events: He is doing well this morning.  Patient just talked with Dr. Jacqualyn Posey over the phone, he is not sure whether he wants to go ahead with surgery.  Assessment & Plan: Principal Problem Diabetic foot infection-acute, failed outpatient antibiotics.  He has now been placed on intravenous antibiotics.  MRI had some equivocal findings for osteomyelitis but also showed some small enhancing fluid collection concerning for abscesses.  Ideally he would agree to an I&D as well as a bone biopsy.  Active Problems Type 1 diabetes mellitus, uncontrolled, with hyperglycemia-continue Lantus and sliding scale.  A1c during this admission was found to be 11.7  CBG (last 3)  Recent Labs    09/29/19 1701 09/29/19 2034 09/30/19 0736  GLUCAP 134* 270* 253*    Normocytic anemia-possibly related to ongoing active infection.  No bleeding, hemoglobin stable  Scheduled Meds: . enoxaparin (LOVENOX) injection  40 mg Subcutaneous Q24H  . insulin aspart  0-5 Units Subcutaneous QHS  . insulin aspart  0-9 Units Subcutaneous TID WC  . insulin glargine  40 Units Subcutaneous QAC breakfast  . metroNIDAZOLE  500 mg Oral Q8H  . sodium chloride flush  3 mL Intravenous Once   Continuous Infusions: . cefTRIAXone (ROCEPHIN)  IV Stopped (09/29/19  1738)  . vancomycin 1,000 mg (09/30/19 0806)   PRN Meds:.acetaminophen **OR** acetaminophen, albuterol, HYDROcodone-acetaminophen, ondansetron **OR** ondansetron (ZOFRAN) IV  Diet Orders (From admission, onward)    Start     Ordered   09/30/19 0857  Diet NPO time specified  Diet effective now        09/30/19 0856          DVT prophylaxis: enoxaparin (LOVENOX) injection 40 mg Start: 09/28/19 1800     Code Status: Full Code  Family Communication: no family at bedside   Status is: Inpatient  Remains inpatient appropriate because:Ongoing diagnostic testing needed not appropriate for outpatient work up, IV treatments appropriate due to intensity of illness or inability to take PO and Inpatient level of care appropriate due to severity of illness   Dispo:  Patient From: Home  Planned Disposition: Home  Expected discharge date: 10/02/19  Medically stable for discharge: No  Consultants:  Podiatry   Procedures:  None  Microbiology  Wound cultures 6/26 in office - pending   Antimicrobials: Vancomycin 6/27 >> Ceftriaxone 6/26>> Metronidazole 6/26 >>    Objective: Vitals:   09/29/19 0457 09/29/19 1940 09/30/19 0459 09/30/19 0736  BP: 113/75 (!) 113/98 130/86 (!) 127/99  Pulse: 73 86 79 74  Resp: 14 16 14 17   Temp: 98 F (36.7 C) 97.6 F (36.4 C) 98.3 F (36.8 C) 98 F (36.7 C)  TempSrc: Oral Oral Oral Oral  SpO2: 100% 97% 100% 100%  Weight:      Height:        Intake/Output Summary (Last 24  hours) at 09/30/2019 1000 Last data filed at 09/30/2019 0600 Gross per 24 hour  Intake 663.36 ml  Output 1 ml  Net 662.36 ml   Filed Weights   09/28/19 1135  Weight: 117.9 kg    Examination:  Constitutional: No distress Eyes: No icterus ENMT: Moist mucous membranes  Data Reviewed: I have independently reviewed following labs and imaging studies   CBC: Recent Labs  Lab 09/28/19 1151 09/29/19 0506 09/30/19 0430  WBC 6.5 5.4 5.4  NEUTROABS 3.8  --   --     HGB 10.8* 10.1* 10.0*  HCT 34.9* 32.4* 31.8*  MCV 86.6 85.7 85.3  PLT 328 285 009   Basic Metabolic Panel: Recent Labs  Lab 09/28/19 1151 09/29/19 0506 09/30/19 0430  NA 136 137 137  K 4.6 4.3 4.6  CL 98 98 100  CO2 28 31 30   GLUCOSE 215* 214* 299*  BUN 10 11 9   CREATININE 1.02 0.93 0.93  CALCIUM 9.8 9.2 9.2   Liver Function Tests: Recent Labs  Lab 09/28/19 1151  AST 12*  ALT 13  ALKPHOS 77  BILITOT 0.4  PROT 8.0  ALBUMIN 2.8*   Coagulation Profile: Recent Labs  Lab 09/28/19 1420  INR 1.1   HbA1C: Recent Labs    09/28/19 1916  HGBA1C 11.7*   CBG: Recent Labs  Lab 09/29/19 0755 09/29/19 1144 09/29/19 1701 09/29/19 2034 09/30/19 0736  GLUCAP 208* 208* 134* 270* 253*    Recent Results (from the past 240 hour(s))  SARS Coronavirus 2 by RT PCR (hospital order, performed in [place] hospital lab) Nasopharyngeal Nasopharyngeal Swab     Status: None   Collection Time: 09/28/19  3:14 PM   Specimen: Nasopharyngeal Swab  Result Value Ref Range Status   SARS Coronavirus 2 NEGATIVE NEGATIVE Final    Comment: (NOTE) SARS-CoV-2 target nucleic acids are NOT DETECTED.  The SARS-CoV-2 RNA is generally detectable in upper and lower respiratory specimens during the acute phase of infection. The lowest concentration of SARS-CoV-2 viral copies this assay can detect is 250 copies / mL. A negative result does not preclude SARS-CoV-2 infection and should not be used as the sole basis for treatment or other patient management decisions.  A negative result may occur with improper specimen collection / handling, submission of specimen other than nasopharyngeal swab, presence of viral mutation(s) within the areas targeted by this assay, and inadequate number of viral copies (<250 copies / mL). A negative result must be combined with clinical observations, patient history, and epidemiological information.  Fact Sheet for Patients:    StrictlyIdeas.no  Fact Sheet for Healthcare Providers: BankingDealers.co.za  This test is not yet approved or  cleared by the [place] FDA and has been authorized for detection and/or diagnosis of SARS-CoV-2 by FDA under an Emergency Use Authorization (EUA).  This EUA will remain in effect (meaning this test can be used) for the duration of the COVID-19 declaration under Section 564(b)(1) of the Act, 21 U.S.C. section 360bbb-3(b)(1), unless the authorization is terminated or revoked sooner.  Performed at [facility], [place] [address], [place], [place] [address]   Blood Cultures x 2 sites     Status: None (Preliminary result)   Collection Time: 09/28/19  7:16 PM   Specimen: BLOOD  Result Value Ref Range Status   Specimen Description BLOOD RIGHT ANTECUBITAL  Final   Special Requests   Final    BOTTLES DRAWN AEROBIC AND ANAEROBIC Blood Culture adequate volume   Culture  Final    NO GROWTH < 24 HOURS Performed at [facility], [place] [address] [place], [place] [address]    Report Status PENDING  Incomplete  Blood Cultures x 2 sites     Status: None (Preliminary result)   Collection Time: 09/28/19  7:24 PM   Specimen: BLOOD RIGHT FOREARM  Result Value Ref Range Status   Specimen Description BLOOD RIGHT FOREARM  Final   Special Requests   Final    BOTTLES DRAWN AEROBIC AND ANAEROBIC Blood Culture adequate volume   Culture   Final    NO GROWTH < 24 HOURS Performed at [facility], [place] [address], [place], [place] [address]    Report Status PENDING  Incomplete     Radiology Studies: MR FOOT RIGHT W WO CONTRAST  Result Date: 09/29/2019 CLINICAL DATA:  Diabetic with history of partial left 5th ray amputation in February. Increasing right foot pain, swelling and drainage. Elevated ESR. EXAM: MRI OF THE RIGHT FOREFOOT WITHOUT AND WITH CONTRAST TECHNIQUE: Multiplanar, multisequence MR imaging of the right forefoot  was performed before and after the administration of intravenous contrast. CONTRAST:  68m GADAVIST GADOBUTROL 1 MMOL/ML IV SOLN COMPARISON:  Radiographs 09/28/2019 and 03/04/2015. FINDINGS: Despite efforts by the technologist and patient, moderate motion artifact is present on today's exam and could not be eliminated. This reduces exam sensitivity and specificity. Bones/Joint/Cartilage There is soft tissue ulceration lateral to the 5th metatarsophalangeal joint. Evaluation of the in adjacent toes and distal metatarsals is limited by motion and incomplete fat saturation on the T2 weighted images. There is low-level T2 hyperintensity within the 4th and 5th metatarsal heads and the adjacent proximal phalanges. There is no abnormal T1 signal or cortical destruction. No significant joint effusions or erosive changes. The 1st through 3rd rays appear normal. No tarsal bone abnormalities or significant midfoot degenerative changes. Ligaments Intact Lisfranc ligament. The collateral ligaments of the metatarsophalangeal joints appear intact. Muscles and Tendons Diffuse low-level muscular T2 hyperintensity and enhancement, most consistent with diabetic myopathy. Mild tenosynovitis and synovial enhancement associated with the extensor digitorum tendons at the level of the midfoot, best seen on image 2/22. Soft tissues As above, soft tissue ulceration lateral to the 5th MTP joint with underlying small superficial fluid collection, best seen on image 23/23 and 4/21. No other focal fluid collections are seen. There is heterogeneous enhancement throughout the soft tissues of the lateral forefoot. No evidence of foreign body or definite septic joint. IMPRESSION: 1. Soft tissue ulceration lateral to the 5th MTP joint. There is low-level T2 hyperintensity within the 4th and 5th metatarsal heads and adjacent proximal phalanges without abnormal T1 signal or cortical destruction. These findings are nonspecific and could be seen with  early marrow edema, hyperemia or early osteomyelitis. No evidence of septic joint. 2. Mild tenosynovitis and synovial enhancement associated with the extensor digitorum tendons at the level of the midfoot. 3. Diffuse low-level muscular T2 hyperintensity and enhancement, most consistent with diabetic myopathy. Electronically Signed   By: WRichardean SaleM.D.   On: 09/29/2019 17:32   MR FOOT LEFT W WO CONTRAST  Result Date: 09/29/2019 CLINICAL DATA:  Diabetic with history of partial left 5th ray amputation in February. Increasing foot pain, swelling and drainage. Elevated ESR. EXAM: MRI OF THE LEFT FOREFOOT WITHOUT AND WITH CONTRAST TECHNIQUE: Multiplanar, multisequence MR imaging of the left forefoot was performed both before and after administration of intravenous contrast. CONTRAST:  135mGADAVIST GADOBUTROL 1 MMOL/ML IV SOLN COMPARISON:  Radiographs 02/03/2019 and 09/28/2019.  MRI 02/04/2019. FINDINGS: Bones/Joint/Cartilage Since the previous MRI, the patient has undergone amputation through the mid 5th metatarsal. The amputation margins are sharp. There is low-level bone marrow edema and enhancement within the 5th metatarsal remnant. There is no abnormal T1 signal. The additional metatarsals and digits appear normal. The visualized tarsal bones appear normal. The alignment is normal at the Lisfranc joint. Ligaments Intact Lisfranc ligament. 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 No focal abnormality or significant tenosynovitis. Soft tissues Apparent skin ulceration inferior and lateral to the 5th metatarsal base with underlying heterogeneous T2 signal and enhancement in the subcutaneous fat. There are small peripherally enhancing fluid collections along the plantar and lateral aspects of the 5th metatarsal base which are best seen on series 13 and 15. No other focal fluid collections. No evidence of foreign body as correlated with recent  radiographs. IMPRESSION: 1. Apparent skin ulceration inferior and lateral to the 5th metatarsal base with underlying heterogeneous T2 signal and enhancement in the subcutaneous fat. Small peripherally enhancing fluid collections along the plantar and lateral aspects of the 5th metatarsal base suspicious for abscesses. 2. Interval amputation through the mid 5th metatarsal with nonspecific low-level marrow edema and enhancement. Given the proximity to the adjacent soft tissue inflammatory changes, osteomyelitis cannot be excluded. 3. The additional bones appear unremarkable. Electronically Signed   By: Richardean Sale M.D.   On: 09/29/2019 17:14   Marzetta Board, MD, PhD [facility]  Between 7 am - 7 pm I am available, please contact me via Amion or Securechat  Between 7 pm - 7 am I am not available, please contact night coverage MD/APP via Amion

## 2019-09-30 NOTE — Telephone Encounter (Signed)
Dr.Wagoner [name] wanted someone to call and speak to her states that pt wanted her to give you a call so that she can get more information about the surgry please call bernice stewart [phone]

## 2019-09-30 NOTE — Progress Notes (Signed)
Initial Nutrition Assessment  DOCUMENTATION CODES:   Obesity unspecified  INTERVENTION:   -MVI with minerals daily -Ensure Max po BID, each supplement provides 150 kcal and 30 grams of protein.  -Magic cup TID with meals, each supplement provides 290 kcal and 9 grams of protein -Double protein portions with meals  NUTRITION DIAGNOSIS:   Increased nutrient needs related to wound healing as evidenced by estimated needs.  GOAL:   Patient will meet greater than or equal to 90% of their needs  MONITOR:   PO intake, Supplement acceptance, Labs, Weight trends, Skin, I & O's  REASON FOR ASSESSMENT:   Consult Wound healing  ASSESSMENT:   [name] is a 34 y.o. male with medical history significant of diabetes mellitus type 1 uncontrolled, chronic diabetic foot ulcers, and s/p partial left fifth ray amputation by Dr. Victorino Dike in February who presents from podiatry clinic with complaint of worsening wound of his right foot.  Pt admitted with worsening diabetic foot infection.  Reviewed I/O's: +1 L x 24 hours and -28 ml since admission  Per podiatry notes, MRI revealed concern for possible abscess and osteomyelitis. They are recommending a I&D with bone biopsy, however, pt would like to think about it before consenting to surgery.   Pt sleeping soundly at time of visit, with TV on.   Reviewed wt hx; pt has experienced a 4.8% wt loss over the past 6 months, which is not significant for time frame.   Lab Results  Component Value Date   HGBA1C 11.7 (H) 09/28/2019   PTA DM medications are 7-12 units insulin aspart TID with meals and 40 units insulin glargine daily before breakfast.   Labs reviewed: CBGS: 134-270 (inpatient orders for glycemic control are 0-5 units insulin aspart daily at bedtime, 0-9 units insulin aspart TID with meals and 40 units insulin glargine daily before breakfast).   NUTRITION - FOCUSED PHYSICAL EXAM:    Most Recent Value  Orbital Region No depletion   Upper Arm Region No depletion  Thoracic and Lumbar Region No depletion  Buccal Region No depletion  Temple Region No depletion  Clavicle Bone Region No depletion  Clavicle and Acromion Bone Region No depletion  Scapular Bone Region No depletion  Dorsal Hand No depletion  Patellar Region No depletion  Anterior Thigh Region No depletion  Posterior Calf Region No depletion  Edema (RD Assessment) Mild  Hair Reviewed  Eyes Reviewed  Mouth Reviewed  Skin Reviewed  Nails Reviewed       Diet Order:   Diet Order            Diet Carb Modified Fluid consistency: Thin; Room service appropriate? Yes  Diet effective now                 EDUCATION NEEDS:   No education needs have been identified at this time  Skin:  Skin Assessment: Skin Integrity Issues: Skin Integrity Issues:: Diabetic Ulcer Diabetic Ulcer: rt lateral foot, rt dorsal foot, lt lateral foot  Last BM:  09/29/19  Height:   Ht Readings from Last 1 Encounters:  09/28/19 6\' 5"  (1.956 m)    Weight:   Wt Readings from Last 1 Encounters:  09/28/19 117.9 kg    Ideal Body Weight:  94.5 kg  BMI:  Body mass index is 30.83 kg/m.  Estimated Nutritional Needs:   Kcal:  2400-2600  Protein:  125-150 grams  Fluid:  > 2.4 L    09/30/19, RD, LDN, CDCES Registered Dietitian II Certified  Diabetes Care and Education Specialist Please refer to [facility] for RD and/or RD on-call/weekend/after hours pager

## 2019-09-30 NOTE — Progress Notes (Addendum)
I called patient to go over the MRI results by phone.  Concern for possible abscess and osteomyelitis.  I recommended going to surgery tonight for an incision and drainage, bone biopsy.  Discussed the surgery was a postoperative course.  After discussion he wants to think about this.  I think this is the best option for him but he is not ready to get decision.  We discussed pros and cons of surgery versus conservative treatment then can start there is a residual infection or abscess this will spread.  I will call him back to further discuss. If no surgery would recommend ID consult. Hospitalist aware.   NPO for now in case of surgery tonight

## 2019-09-30 NOTE — Telephone Encounter (Signed)
I called and spoke to her. We discussed the procedure and postop course. I answered the questions and she is going to discuss with the patient.

## 2019-09-30 NOTE — Progress Notes (Signed)
Inpatient Diabetes Program Recommendations  AACE/ADA: New Consensus Statement on Inpatient Glycemic Control (2015)  Target Ranges:  Prepandial:   less than 140 mg/dL      Peak postprandial:   less than 180 mg/dL (1-2 hours)      Critically ill patients:  140 - 180 mg/dL   Lab Results  Component Value Date   GLUCAP 210 (H) 09/30/2019   HGBA1C 11.7 (H) 09/28/2019    Review of Glycemic Control Results for [name], [name] (MRN [id number]) as of 09/30/2019 12:34  Ref. Range 09/28/2019 20:50 09/29/2019 07:55 09/29/2019 11:44 09/29/2019 17:01 09/29/2019 20:34 09/30/2019 07:36 09/30/2019 11:51  Glucose-Capillary Latest Ref Range: 70 - 99 mg/dL 499 (H) 718 (H) 209 (H) 134 (H) 270 (H) 253 (H) 210 (H)   Diabetes history: DM 1 Outpatient Diabetes medications:  Lantus 40 units q AM, Novolog 7-12 units tid with meals  Current orders for Inpatient glycemic control:  Lantus 40 units q AM, Novolog sensitive tid with meals and HS Inpatient Diabetes Program Recommendations:    Consider increasing Novolog correction to q 4 hours while NPO.   Once diet restarted, patient would benefit from the addition of Novolog meal coverage 4 units tid with meals (hold if patient eats less than 50%).   Thanks,  Beryl Meager, RN, BC-ADM Inpatient Diabetes Coordinator Pager [phone] (8a-5p)

## 2019-10-01 ENCOUNTER — Encounter (HOSPITAL_COMMUNITY): Payer: Self-pay | Admitting: Certified Registered Nurse Anesthetist

## 2019-10-01 ENCOUNTER — Encounter (HOSPITAL_COMMUNITY)
Admission: EM | Disposition: A | Discharge status: Home / Self Care | Payer: Self-pay | Source: Ambulatory Visit | Attending: Internal Medicine

## 2019-10-01 DIAGNOSIS — E10621 Type 1 diabetes mellitus with foot ulcer: Secondary | ICD-10-CM

## 2019-10-01 DIAGNOSIS — L97509 Non-pressure chronic ulcer of other part of unspecified foot with unspecified severity: Secondary | ICD-10-CM

## 2019-10-01 DIAGNOSIS — E08621 Diabetes mellitus due to underlying condition with foot ulcer: Secondary | ICD-10-CM

## 2019-10-01 DIAGNOSIS — E11628 Type 2 diabetes mellitus with other skin complications: Secondary | ICD-10-CM

## 2019-10-01 DIAGNOSIS — L089 Local infection of the skin and subcutaneous tissue, unspecified: Secondary | ICD-10-CM

## 2019-10-01 DIAGNOSIS — L03115 Cellulitis of right lower limb: Secondary | ICD-10-CM

## 2019-10-01 DIAGNOSIS — E11621 Type 2 diabetes mellitus with foot ulcer: Secondary | ICD-10-CM

## 2019-10-01 DIAGNOSIS — M7989 Other specified soft tissue disorders: Secondary | ICD-10-CM

## 2019-10-01 LAB — GLUCOSE, CAPILLARY
Glucose-Capillary: 155 mg/dL — ABNORMAL HIGH (ref 70–99)
Glucose-Capillary: 184 mg/dL — ABNORMAL HIGH (ref 70–99)
Glucose-Capillary: 220 mg/dL — ABNORMAL HIGH (ref 70–99)
Glucose-Capillary: 171 mg/dL — ABNORMAL HIGH (ref 70–99)
Glucose-Capillary: 207 mg/dL — ABNORMAL HIGH (ref 70–99)

## 2019-10-01 LAB — VANCOMYCIN, TROUGH: Vancomycin Tr: 21 ug/mL (ref 15–20)

## 2019-10-01 SURGERY — IRRIGATION AND DEBRIDEMENT FOOT
Anesthesia: Monitor Anesthesia Care | Laterality: Bilateral

## 2019-10-01 MED ORDER — SODIUM CHLORIDE 0.9 % IV SOLN
INTRAVENOUS | Status: DC | PRN
  Administered 2019-10-01 (×3): 250 mL via INTRAVENOUS

## 2019-10-01 MED ORDER — VANCOMYCIN HCL 750 MG/150ML IV SOLN
750.0000 mg | Freq: Three times a day (TID) | INTRAVENOUS | Status: DC
  Administered 2019-10-02 – 2019-10-04 (×8): 750 mg via INTRAVENOUS
  Filled 2019-10-01 (×10): qty 150

## 2019-10-01 NOTE — Progress Notes (Signed)
Pharmacy Antibiotic Note  [name] is a 34 y.o. male admitted on 09/28/2019 with diabetic foot infection, suspected osteomyelitis.     Vancomycin trough 21  Plan: Decrease Vancomycin to 750 mg iv Q 8 hours Continue to follow  Height: 6\' 5"  (195.6 cm) Weight: 117.9 kg (260 lb) IBW/kg (Calculated) : 89.1  Temp (24hrs), Avg:97.9 F (36.6 C), Min:97.3 F (36.3 C), Max:98.2 F (36.8 C)  Recent Labs  Lab 09/28/19 1151 09/29/19 0506 09/30/19 0430 10/01/19 1648  WBC 6.5 5.4 5.4  --   CREATININE 1.02 0.93 0.93  --   LATICACIDVEN 0.8  --   --   --   VANCOTROUGH  --   --   --  21*    Estimated Creatinine Clearance: 160.8 mL/min (by C-G formula based on SCr of 0.93 mg/dL).    Allergies  Allergen Reactions  . Basaglar 10/03/19 [Insulin Glargine] Diarrhea  . Metformin And Related Diarrhea and Nausea And Vomiting  . Trulicity [Dulaglutide] Diarrhea   Thank you Stephanie Coup, PharmD  Please check [email or web address] for unit-specific pharmacist phone numbers

## 2019-10-01 NOTE — Consult Note (Signed)
[facility]    Date of Admission:  09/28/2019   Total days of antibiotics 6        Day 4 ceftriaxone        Day 4 metronidazole        Day 3 vancomycin       Reason for Consult: Bilateral diabetic foot infections    Referring Provider: Dr. Pamella Pert  Assessment: I told him that I am also concerned about bilateral foot infections with residual abscess in the left foot and possible osteomyelitis of the right foot.  I recommended he undergo surgery by Dr. Ardelle Anton.  I told him that there are 2 potential benefits of this, debridement of devitalized tissue and obtaining as much culture information about what organisms he harbors to help guide future antibiotic therapy.  I told him that surgery combined with antibiotics and optimal blood sugar control will give him the best chance of curing the infection, healing the wounds and avoiding further amputations.  Plan: 1. Continue current antibiotics pending final cultures  Principal Problem:   Diabetic foot infection (HCC) Active Problems:   Uncontrolled type 1 diabetes mellitus with hyperglycemia (HCC)   Normocytic anemia   Scheduled Meds: . enoxaparin (LOVENOX) injection  40 mg Subcutaneous Q24H  . insulin aspart  0-5 Units Subcutaneous QHS  . insulin aspart  0-9 Units Subcutaneous TID WC  . insulin glargine  40 Units Subcutaneous QAC breakfast  . metroNIDAZOLE  500 mg Oral Q8H  . multivitamin with minerals  1 tablet Oral Daily  . Ensure Max Protein  11 oz Oral BID  . sodium chloride flush  3 mL Intravenous Once   Continuous Infusions: . sodium chloride 250 mL (10/01/19 1028)  . cefTRIAXone (ROCEPHIN)  IV 2 g (09/30/19 1725)  . 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  He had a  fracture of his left foot last summer and eventually developed a plantar ulcer on the lateral aspect of his left foot.  He started going to the wound center last fall after his foot became infected.  Wound drainage cultures in November, December and January grew Serratia staph lugdunensis, and Enterococcus.  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  He seemed to be healing well.  About 3 weeks ago he began to have swelling and redness of his right foot.  Recently the wound started to open and drain.  He was seen in urgent care on 09/26/2019 and started on doxycycline and referred to Dr. Ardelle Anton who saw him in the office 2 days later.  He drained an abscess in his left foot and directed him for admission here.  The abscess culture is growing staph aureus with susceptibilities pending.  He has had MRIs of both feet showing:  Left foot IMPRESSION: 1. Apparent skin ulceration inferior and lateral to the 5th metatarsal base with underlying heterogeneous T2 signal and enhancement in the subcutaneous fat. Small peripherally enhancing fluid collections along the plantar and lateral aspects of the 5th metatarsal base suspicious for abscesses. 2. Interval amputation through the mid 5th metatarsal with nonspecific low-level marrow edema and enhancement. Given the proximity to the adjacent soft tissue inflammatory changes, osteomyelitis cannot be excluded. 3. The additional bones appear unremarkable.  Right foot IMPRESSION: 1. Soft tissue ulceration lateral to the 5th MTP joint. There is low-level T2 hyperintensity within the 4th and 5th metatarsal heads and adjacent proximal phalanges without abnormal T1 signal or cortical destruction. These findings are nonspecific and could be seen with early marrow edema, hyperemia or early osteomyelitis. No evidence of septic joint. 2. Mild tenosynovitis and synovial enhancement associated with  the extensor digitorum tendons at the level of the midfoot. 3. Diffuse low-level muscular T2 hyperintensity and enhancement, most consistent with diabetic myopathy.  He says that he is worried about having further surgery and a bone biopsy and wants to speak to me about whether or not this can be treated with antibiotics alone.   Review of Systems: Review of Systems  Constitutional: Negative for chills, diaphoresis and fever.  Gastrointestinal: Negative for abdominal pain, diarrhea, nausea and vomiting.  Musculoskeletal: Negative for joint pain.    Past Medical History:  Diagnosis Date  . Diabetes mellitus     Social History   Tobacco Use  . Smoking status: Never Smoker  . Smokeless tobacco: Never Used  Vaping Use  . Vaping Use: Never used  Substance Use Topics  . Alcohol use: No  . Drug use: No    Family History  Problem Relation Age of Onset  . Hypertension Other   . Diabetes Other   . Healthy Mother    Allergies  Allergen Reactions  . Basaglar Stephanie Coup [Insulin Glargine] Diarrhea  . Metformin And Related Diarrhea and Nausea And Vomiting  . Trulicity [Dulaglutide] Diarrhea    OBJECTIVE: Blood pressure 136/86, pulse 71, temperature 97.9 F (36.6 C), temperature source Oral, resp. rate 17, height 6\' 5"  (1.956 m), weight 117.9 kg, SpO2 98 %.  Physical Exam Constitutional:      Comments: He is sitting up in bed eating a late lunch and watching television.  He is in good spirits.  Cardiovascular:     Rate and Rhythm: Normal rate and regular rhythm.     Heart sounds: No murmur heard.   Pulmonary:     Effort: Pulmonary effort is normal.     Breath sounds: Normal breath sounds.  Musculoskeletal:        General: No swelling.  Psychiatric:        Mood and Affect: Mood normal.         Lab Results Lab Results  Component Value Date   WBC 5.4 09/30/2019   HGB 10.0 (L) 09/30/2019   HCT 31.8 (L) 09/30/2019   MCV 85.3 09/30/2019   PLT 304 09/30/2019     Lab Results  Component Value Date   CREATININE 0.93 09/30/2019   BUN 9 09/30/2019   NA 137 09/30/2019   K 4.6 09/30/2019   CL 100 09/30/2019   CO2 30 09/30/2019    Lab Results  Component Value Date   ALT 13 09/28/2019   AST 12 (L) 09/28/2019   ALKPHOS 77 09/28/2019   BILITOT 0.4 09/28/2019     Microbiology: Recent Results (from the past 240 hour(s))  WOUND CULTURE     Status: Abnormal (Preliminary result)   Collection Time: 09/28/19 12:08 PM   Specimen: Foot, Right; Wound  Result Value Ref Range Status   MICRO NUMBER: 09/30/19  Preliminary   SPECIMEN QUALITY: Adequate  Preliminary   SOURCE: FOOT, RIGHT  Preliminary   STATUS: PRELIMINARY  Preliminary   GRAM STAIN: Gram positive cocci in chains  Preliminary    Comment: Few White blood cells seen Many epithelial cells  Moderate Gram positive cocci in clusters Moderate Gram positive cocci in chains   ISOLATE 1: Staphylococcus aureus (A)  Preliminary    Comment: Heavy growth of Staphylococcus aureus  SARS Coronavirus 2 by RT PCR (hospital order, performed in [facility] hospital lab) Nasopharyngeal Nasopharyngeal Swab     Status: None   Collection Time: 09/28/19  3:14 PM   Specimen: Nasopharyngeal Swab  Result Value Ref Range Status   SARS Coronavirus 2 NEGATIVE NEGATIVE Final    Comment: (NOTE) SARS-CoV-2 target nucleic acids are NOT DETECTED.  The SARS-CoV-2 RNA is generally detectable in upper and lower respiratory specimens during the acute phase of infection. The lowest concentration of SARS-CoV-2 viral copies this assay can detect is 250 copies / mL. A negative result does not preclude SARS-CoV-2 infection and should not be used as the sole basis for treatment or other patient management decisions.  A negative result may occur with improper specimen collection / handling, submission of specimen other than nasopharyngeal swab, presence of viral mutation(s) within the areas targeted by this assay, and inadequate number  of viral copies (<250 copies / mL). A negative result must be combined with clinical observations, patient history, and epidemiological information.  Fact Sheet for Patients:   BoilerBrush.com.cy  Fact Sheet for Healthcare Providers: https://pope.com/  This test is not yet approved or  cleared by the [place] FDA and has been authorized for detection and/or diagnosis of SARS-CoV-2 by FDA under an Emergency Use Authorization (EUA).  This EUA will remain in effect (meaning this test can be used) for the duration of the COVID-19 declaration under Section 564(b)(1) of the Act, 21 U.S.C. section 360bbb-3(b)(1), unless the authorization is terminated or revoked sooner.  Performed at [facility], [address] [place], [place] [address]   Blood Cultures x 2 sites     Status: None (Preliminary result)   Collection Time: 09/28/19  7:16 PM   Specimen: BLOOD  Result Value Ref Range Status   Specimen Description BLOOD RIGHT ANTECUBITAL  Final   Special Requests   Final    BOTTLES DRAWN AEROBIC AND ANAEROBIC Blood Culture adequate volume   Culture   Final    NO GROWTH 3 DAYS Performed at [facility], [address] [place], [place] [address]    Report Status PENDING  Incomplete  Blood Cultures x 2 sites     Status: None (Preliminary result)   Collection Time: 09/28/19  7:24 PM   Specimen: BLOOD RIGHT FOREARM  Result Value Ref Range Status   Specimen Description BLOOD RIGHT FOREARM  Final   Special Requests   Final    BOTTLES DRAWN AEROBIC AND ANAEROBIC Blood Culture adequate volume   Culture   Final    NO GROWTH 3 DAYS Performed at [facility], [address], [place], [place] [address]    Report Status PENDING  Incomplete    Cliffton Asters, MD [facility] for Infectious [facility] [phone] pager   [phone] cell 10/01/2019, 1:34 PM

## 2019-10-01 NOTE — Progress Notes (Signed)
  Subjective:  Patient ID: [name], male    DOB: 30-Aug-1985,  MRN: [id number]  Patient seen and evaluated. Seen by ID today. Pain controlled BLE. Now open to surgical intervention. Denies N/V/F/Ch.  Objective:   Vitals:   10/01/19 1343 10/01/19 1951  BP: (!) 145/88 (!) 148/96  Pulse: 94 90  Resp: 19 12  Temp: 98 F (36.7 C) 98.2 F (36.8 C)  SpO2: 100% 100%   General AA&O x3. Normal mood and affect.  Vascular BLE warm and well perfused. Brisk capillary refill to all digits. Pedal hair present.  Neurologic Epicritic sensation grossly diminished.  Dermatologic Small superficial ulcer left 5th met base. No warmth erythema crepitus Two wounds RLE - 5th MPJ, approx 4th MPJ. +SS drainage. Maceration, fibrotic wound base. Wound base rather boggy.  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  Diabetic ulcers BLE -Discussed with patient plan for surgery. Concur with ID and Dr. Jacqualyn Posey. He is now amenable to Debridement and to bone biopsy. He does want Dr. Jacqualyn Posey to perform the procedure, however. I did discuss that Dr. Jacqualyn Posey would not be available until much later in the evening. Patient still requested Dr. Jacqualyn Posey to do the procedure. -Imaging: Studies reviewed -Antibiotics: Continue abx per ID -WB Status: WBAT in surgical shoes BLE -Wound Care: Xeroform and DSD Bilat as per orders. Dressed with betadine WTD Right, Mepilex border today. Change tomorrow AM. -Surgical Plan: OR tomorrow PM (approx 7pm) for Bilateral foot debridement, bone biopsies as indicated. -NPO after 10am.  Evelina Bucy, DPM  Accessible via secure chat for questions or concerns.

## 2019-10-01 NOTE — Progress Notes (Signed)
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 Victorino Dike in February, was sent from clinic to the ER for worsening wounds of the right foot.  He is an ongoing issue for several months with his right foot.  He recently seen in urgent care was given doxycycline.  Also the postoperative amputation left side healed however last couple days he noticed that the skin starts to come off and some drainage. MRI was done which did have concerns of possibly osteomyelitis/abscess. I called him earlier today to discuss the results and he was hesitant on surgery.  I also spoke to his aunt to explain the procedure.  After further discussion with him he was hesitant and did not agree to surgery so this was canceled today.  Currently denies any pain and he feels well and denies any systemic concerns including fevers, chills, nausea, vomiting.  Denies any calf pain, chest pain, shortness of breath.  Swelling in the right side is improved.  Objective: AAO x3, NAD Ulcerations present bilaterally.  The left side fifth metatarsal base superficial wound present with mild swelling.  There is no fluctuation.  There is no crepitation.  No purulence identified.  On the right side there is macerated tissue with localized edema and small amount of purulence identified.  Significant swelling right side on the left side but somewhat improved. No pain with calf compression, warmth, erythema          Assessment: 34 year old male bilateral foot ulcerations with concern for abscess/osteomyelitis  Plan: I again reviewed the MRI results with him.  We discussed pros and cons of surgery but he is very hesitant to proceed with surgery.  He does not want a bone biopsy.  Discussed the reason for this.  It seems that before he had the left fifth ray amputation he had a biopsy which he felt like to  infection.  At this point would recommend infectious disease consultation for antibiotic recommendations long-term.  I will still keep him n.p.o. tonight he is scheduled for surgery tomorrow if he is agreeable.  If this surgery does not occur at the scheduled time Tuesday can plan for Wednesday as well if needed.  Also upon discharge referral to the wound care center.  He previously had been going to wound care center left foot prior to the amputation.  Xeroform was applied to the wounds followed by dry sterile dressing.  Encouraged elevation.  We will continue to follow.  Ovid Curd, DPM O: [phone] C: [phone]

## 2019-10-01 NOTE — ED Provider Notes (Signed)
[facility]   [id number] 09/26/19 Arrival Time: 1839  ASSESSMENT & PLAN:  1. 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 doxycycline (VIBRAMYCIN) 100 MG capsule    Sig: Take 1 capsule (100 mg total) by mouth 2 (two) times daily.    Dispense:  20 capsule    Refill:  0      Follow-up Information    Schedule an appointment as soon as possible for a visit  with [facility]).   Contact information: [address] [place],  [place]  [address]  [phone]              Reviewed expectations re: course of current medical issues. Questions answered. Outlined signs and symptoms indicating need for more acute intervention. Understanding verbalized. After Visit Summary given.   SUBJECTIVE: History from: patient. [name] is a 34 y.o. male with DM who presents with a wound/ulcer of his R foot; approx 1-2 weeks; reports callous removal prior. Some bleeding and drainage. Afebrile. Able to bear weight.  Mild swelling. No trauma to foot reported.    OBJECTIVE:  Vitals:   09/26/19 1919  BP: 126/66  Pulse: (!) 115  Resp: 18  Temp: 99.7 F (37.6 C)  TempSrc: Oral  SpO2: 98%  Slight tachycardia noted.  General appearance: alert; no distress Eyes: PERRLA; EOMI; conjunctiva normal HENT: [place]; AT; nasal mucosa normal; oral mucosa normal Neck: supple  Lungs: speaks full sentences without difficulty; unlabored Extremities: no edema Skin: R foot with ulcer/wound of lateral R foot; no active purulent drainage; no significant fluctuance appreciated; mild swelling surrounding Neurologic: normal gait Psychological: alert and cooperative; normal mood and affect    Allergies  Allergen Reactions  . Basaglar Stephanie Coup [Insulin Glargine] Diarrhea  . Metformin And Related Diarrhea and Nausea And Vomiting  .  Trulicity [Dulaglutide] Diarrhea    Past Medical History:  Diagnosis Date  . Diabetes mellitus    Social History   Socioeconomic History  . Marital status: Single    Spouse name: Not on file  . Number of children: Not on file  . Years of education: Not on file  . Highest education level: Not on file  Occupational History  . Occupation: Nurse, learning disability at [employer]  . Smoking status: Never Smoker  . Smokeless tobacco: Never Used  Vaping Use  . Vaping Use: Never used  Substance and Sexual Activity  . Alcohol use: No  . Drug use: No  . Sexual activity: Not on file  Other Topics Concern  . Not on file  Social History Narrative  . Not on file   Social Determinants of Health   Financial Resource Strain:   . Difficulty of Paying Living Expenses:   Food Insecurity:   . Worried About Programme researcher, broadcasting/film/video in the Last Year:   . Barista in the Last Year:   Transportation Needs:   . Freight forwarder (Medical):   Marland Kitchen Lack of Transportation (Non-Medical):   Physical Activity:   . Days of Exercise per Week:   . Minutes of Exercise per Session:   Stress:   . Feeling of Stress :   Social Connections:   . Frequency of Communication with Friends and Family:   . Frequency of Social Gatherings with Friends and Family:   .  Attends Religious Services:   . Active Member of Clubs or Organizations:   . Attends Banker Meetings:   Marland Kitchen Marital Status:   Intimate Partner Violence:   . Fear of Current or Ex-Partner:   . Emotionally Abused:   Marland Kitchen Physically Abused:   . Sexually Abused:    Family History  Problem Relation Age of Onset  . Hypertension Other   . Diabetes Other   . Healthy Mother    Past Surgical History:  Procedure Laterality Date  . 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;  Surgeon: Toni Arthurs, MD;  Location: [facility];  Service: Orthopedics;  Laterality: Left;  . NO PAST SURGERIES        Mardella Layman, MD 10/01/19 [phone]

## 2019-10-01 NOTE — Progress Notes (Signed)
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 Victorino Dike in February, came in from podiatry clinic for worsening wounds of the right foot.  He was seen by Dr. Ardelle Anton and sent to the ER.  He has been on oral antibiotics with doxycycline since 6/24 and fail to improve.  The wounds were debrided briefly in the office with pus drainage was appreciated, cultures were obtained.  Subjective / 24h Interval events: No complaints this morning.  Still undecided about doing surgery.  No abdominal pain, no nausea or vomiting.  No chest pain, no shortness of breath.  Assessment & Plan: Principal Problem Diabetic foot infection-acute, failed outpatient antibiotics.  He has now been placed on intravenous antibiotics.  MRI had some equivocal findings for osteomyelitis but also showed some small enhancing fluid collection concerning for abscesses.  Ideally he would agree to an I&D as well as a bone biopsy.  Dr. Loreta Ave discussed with patient several times but patient is still undecided whether he would proceed with surgery and wishes to discuss with infectious disease MD.  Dr. Orvan Falconer consulted, appreciate input  Active Problems Type 1 diabetes mellitus, uncontrolled, with hyperglycemia-continue Lantus and sliding scale.  A1c during this admission was found to be 11.7.  CBGs as below  CBG (last 3)  Recent Labs    10/01/19 0106 10/01/19 0741 10/01/19 1159  GLUCAP 155* 184* 220*    Normocytic anemia-possibly related to ongoing active infection.  No bleeding, hemoglobin stable.  Repeat CBC in a.m.  Scheduled Meds: . enoxaparin (LOVENOX) injection  40 mg Subcutaneous Q24H  . insulin aspart  0-5 Units Subcutaneous QHS  . insulin  aspart  0-9 Units Subcutaneous TID WC  . insulin glargine  40 Units Subcutaneous QAC breakfast  . metroNIDAZOLE  500 mg Oral Q8H  . multivitamin with minerals  1 tablet Oral Daily  . Ensure Max Protein  11 oz Oral BID  . sodium chloride flush  3 mL Intravenous Once   Continuous Infusions: . sodium chloride 250 mL (10/01/19 1028)  . cefTRIAXone (ROCEPHIN)  IV 2 g (09/30/19 1725)  . vancomycin 1,000 mg (10/01/19 1029)   PRN Meds:.sodium chloride, acetaminophen **OR** acetaminophen, albuterol, HYDROcodone-acetaminophen, ondansetron **OR** ondansetron (ZOFRAN) IV  Diet Orders (From admission, onward)    Start     Ordered   10/01/19 1007  Diet Carb Modified Fluid consistency: Thin; Room service appropriate? Yes  Diet effective now       Question Answer Comment  Diet-HS Snack? Nothing   Calorie Level Medium 1600-2000   Fluid consistency: Thin   Room service appropriate? Yes      10/01/19 1006          DVT prophylaxis: enoxaparin (LOVENOX) injection 40 mg Start: 09/28/19 1800     Code Status: Full Code  Family Communication: no family at bedside   Status is: Inpatient  Remains inpatient appropriate because:Ongoing diagnostic testing needed not appropriate for outpatient work up, IV treatments appropriate due to intensity of illness or inability to take PO and Inpatient level of care appropriate due to severity of illness   Dispo:  Patient From: Home  Planned Disposition: Home  Expected discharge date: 10/02/19  Medically stable for discharge: No  Consultants:  Podiatry Infectious disease  Procedures:  None  Microbiology  Wound cultures 6/26 in office - pending   Antimicrobials: Vancomycin 6/27 >> Ceftriaxone 6/26>> Metronidazole 6/26 >>    Objective: Vitals:   09/30/19 1412 09/30/19 1950 10/01/19 0354 10/01/19 0739  BP: 135/81 132/88 135/89 136/86  Pulse: 79 79 72 71  Resp: 17 16 14 17   Temp: 98 F (36.7 C) 98.4 F (36.9 C) (!) 97.3 F (36.3 C) 97.9 F  (36.6 C)  TempSrc: Oral Oral Oral Oral  SpO2: 100% 100% 98% 98%  Weight:      Height:        Intake/Output Summary (Last 24 hours) at 10/01/2019 1323 Last data filed at 10/01/2019 0900 Gross per 24 hour  Intake 440 ml  Output -  Net 440 ml   Filed Weights   09/28/19 1135  Weight: 117.9 kg    Examination:  Constitutional: NAD, calm, comfortable Eyes: Lids and conjunctivae normal ENMT: Mucous membranes are moist.  Neck: normal, supple Respiratory: clear to auscultation bilaterally, no wheezing, no crackles. Normal respiratory effort.  Cardiovascular: Regular rate and rhythm, no murmurs / rubs / gallops. Abdomen: Bowel sounds positive, no distention noted Skin: No rashes seen Neurologic: non focal  Data Reviewed: I have independently reviewed following labs and imaging studies   CBC: Recent Labs  Lab 09/28/19 1151 09/29/19 0506 09/30/19 0430  WBC 6.5 5.4 5.4  NEUTROABS 3.8  --   --   HGB 10.8* 10.1* 10.0*  HCT 34.9* 32.4* 31.8*  MCV 86.6 85.7 85.3  PLT 328 285 304   Basic Metabolic Panel: Recent Labs  Lab 09/28/19 1151 09/29/19 0506 09/30/19 0430  NA 136 137 137  K 4.6 4.3 4.6  CL 98 98 100  CO2 28 31 30   GLUCOSE 215* 214* 299*  BUN 10 11 9   CREATININE 1.02 0.93 0.93  CALCIUM 9.8 9.2 9.2   Liver Function Tests: Recent Labs  Lab 09/28/19 1151  AST 12*  ALT 13  ALKPHOS 77  BILITOT 0.4  PROT 8.0  ALBUMIN 2.8*   Coagulation Profile: Recent Labs  Lab 09/28/19 1420  INR 1.1   HbA1C: Recent Labs    09/28/19 1916  HGBA1C 11.7*   CBG: Recent Labs  Lab 09/30/19 1151 09/30/19 1628 10/01/19 0106 10/01/19 0741 10/01/19 1159  GLUCAP 210* 159* 155* 184* 220*    Recent Results (from the past 240 hour(s))  WOUND CULTURE     Status: Abnormal (Preliminary result)   Collection Time: 09/28/19 12:08 PM   Specimen: Foot, Right; Wound  Result Value Ref Range Status   MICRO NUMBER: 10/03/19  Preliminary   SPECIMEN QUALITY: Adequate  Preliminary    SOURCE: FOOT, RIGHT  Preliminary   STATUS: PRELIMINARY  Preliminary   GRAM STAIN: Gram positive cocci in chains  Preliminary    Comment: Few White blood cells seen Many epithelial cells Moderate Gram positive cocci in clusters Moderate Gram positive cocci in chains   ISOLATE 1: Staphylococcus aureus (A)  Preliminary    Comment: Heavy growth of Staphylococcus aureus  SARS Coronavirus 2 by RT PCR (hospital order, performed in [facility] hospital lab) Nasopharyngeal Nasopharyngeal Swab     Status: None   Collection Time: 09/28/19  3:14 PM   Specimen: Nasopharyngeal Swab  Result Value Ref Range Status   SARS Coronavirus 2 NEGATIVE NEGATIVE Final    Comment: (NOTE) SARS-CoV-2 target nucleic acids are NOT DETECTED.  The SARS-CoV-2 RNA is generally detectable in upper and lower respiratory specimens during the acute phase of infection.  The lowest concentration of SARS-CoV-2 viral copies this assay can detect is 250 copies / mL. A negative result does not preclude SARS-CoV-2 infection and should not be used as the sole basis for treatment or other patient management decisions.  A negative result may occur with improper specimen collection / handling, submission of specimen other than nasopharyngeal swab, presence of viral mutation(s) within the areas targeted by this assay, and inadequate number of viral copies (<250 copies / mL). A negative result must be combined with clinical observations, patient history, and epidemiological information.  Fact Sheet for Patients:   BoilerBrush.com.cy  Fact Sheet for Healthcare Providers: https://pope.com/  This test is not yet approved or  cleared by the [place] FDA and has been authorized for detection and/or diagnosis of SARS-CoV-2 by FDA under an Emergency Use Authorization (EUA).  This EUA will remain in effect (meaning this test can be used) for the duration of the COVID-19 declaration under  Section 564(b)(1) of the Act, 21 U.S.C. section 360bbb-3(b)(1), unless the authorization is terminated or revoked sooner.  Performed at [facility], [address], [place], [place] [address]   Blood Cultures x 2 sites     Status: None (Preliminary result)   Collection Time: 09/28/19  7:16 PM   Specimen: BLOOD  Result Value Ref Range Status   Specimen Description BLOOD RIGHT ANTECUBITAL  Final   Special Requests   Final    BOTTLES DRAWN AEROBIC AND ANAEROBIC Blood Culture adequate volume   Culture   Final    NO GROWTH 3 DAYS Performed at [facility], [address] [place], [place] [address]    Report Status PENDING  Incomplete  Blood Cultures x 2 sites     Status: None (Preliminary result)   Collection Time: 09/28/19  7:24 PM   Specimen: BLOOD RIGHT FOREARM  Result Value Ref Range Status   Specimen Description BLOOD RIGHT FOREARM  Final   Special Requests   Final    BOTTLES DRAWN AEROBIC AND ANAEROBIC Blood Culture adequate volume   Culture   Final    NO GROWTH 3 DAYS Performed at [facility], [address] [place], [place] [address]    Report Status PENDING  Incomplete     Radiology Studies: DG Foot Complete Left  Result Date: 09/30/2019 Please see detailed radiograph report in office note.  DG Foot Complete Right  Result Date: 09/30/2019 Please see detailed radiograph report in office note.  Pamella Pert, MD, PhD [facility]  Between 7 am - 7 pm I am available, please contact me via Amion or Securechat  Between 7 pm - 7 am I am not available, please contact night coverage MD/APP via Amion

## 2019-10-01 NOTE — Progress Notes (Signed)
Inpatient Diabetes Program Recommendations  AACE/ADA: New Consensus Statement on Inpatient Glycemic Control (2015)  Target Ranges:  Prepandial:   less than 140 mg/dL      Peak postprandial:   less than 180 mg/dL (1-2 hours)      Critically ill patients:  140 - 180 mg/dL   Lab Results  Component Value Date   GLUCAP 220 (H) 10/01/2019   HGBA1C 11.7 (H) 09/28/2019    Review of Glycemic Control Results for [name], [name] (MRN [id number]) as of 10/01/2019 12:57  Ref. Range 09/29/2019 17:01 09/29/2019 20:34 09/30/2019 07:36  Glucose-Capillary Latest Ref Range: 70 - 99 mg/dL 683 (H) 419 (H) 622 (H)   Results for [name], [name] (MRN [id number]) as of 10/01/2019 12:57  Ref. Range 09/30/2019 11:51 09/30/2019 16:28 10/01/2019 01:06 10/01/2019 07:41 10/01/2019 11:59  Glucose-Capillary Latest Ref Range: 70 - 99 mg/dL 941 (H) 740 (H) 814 (H) 184 (H) 220 (H)   Diabetes history:  DM1  Outpatient Diabetes medications:  Lantus 40 units daily  Novolog 7-12 units tid  Current orders for Inpatient glycemic control:  Lantus 40 units qhs  Novolog 0-9 units tid   Inpatient Diabetes Program Recommendations:     Novolog 3 units meal coverage tid with meals if eats at least 50%   Note:  Spoke with patient at bedside.  He states he takes above medications.  He has not seen his endocrinologist, Dr. Sharl Ma since prior to this pandemic.  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  He has to pay $80 per specialist visit and states that he has several back co-pays for Dr. Daune Perch office and they will not see him until he has paid all back co-pays in full.  He also see's a podiatrist which is $80 per visit.  He states he cannot afford to see his endocrinologist at this time.  He was out of his Lantus for about 2 weeks in May because the pharmacy was out?  Over this last couple of years he has been switched from Lantus to [place] to [place] along with some others he cannot remember.    Reviewed patient's current A1c of  11.7% (average blood sugar of 289 mg/dl). Explained what a A1c is and what it measures. Also reviewed goal A1c with patient, importance of good glucose control @ home, and blood sugar goals.  He does not drink any regular soda or juices.  He does like sweets.  Reviewed CHO's and foods that contain CHO's and importance of staying within goal per meal for optimal glucose control and healing.    He states he originally had DM2 and lost over 100 lbs several years ago and now has DM1.  Explained importance administering basal insulin daily.  He uses a [employer].  He states his Freestyle Josephine Igo was about 80 points lower than the hospital glucometer reading.  He does not have a glucometer at home.  He has a Josephine Igo that he can check a cbg and calibrate.  Patient now has a CM diet ordered.    He denies difficulties paying for insulins as he has prescription coverage.  Refills are difficult for him because he is not current with endo and his PCP is in the same practice so they will not allow him to make an appointment until he has paid his account in full.    Will continue to follow while inpatient.  Thank you, Dulce Sellar, RN, BSN Diabetes Coordinator Inpatient Diabetes Program [phone] (team pager from 8a-5p)

## 2019-10-02 ENCOUNTER — Inpatient Hospital Stay: Payer: Self-pay

## 2019-10-02 ENCOUNTER — Inpatient Hospital Stay (HOSPITAL_COMMUNITY): Payer: BC Managed Care – PPO | Admitting: Certified Registered Nurse Anesthetist

## 2019-10-02 ENCOUNTER — Other Ambulatory Visit: Payer: Self-pay

## 2019-10-02 ENCOUNTER — Encounter (HOSPITAL_COMMUNITY): Payer: Self-pay | Admitting: Internal Medicine

## 2019-10-02 ENCOUNTER — Inpatient Hospital Stay (HOSPITAL_COMMUNITY): Payer: BC Managed Care – PPO

## 2019-10-02 ENCOUNTER — Encounter (HOSPITAL_COMMUNITY)
Admission: EM | Disposition: A | Discharge status: Home / Self Care | Payer: Self-pay | Source: Ambulatory Visit | Attending: Internal Medicine

## 2019-10-02 DIAGNOSIS — L97519 Non-pressure chronic ulcer of other part of right foot with unspecified severity: Secondary | ICD-10-CM

## 2019-10-02 DIAGNOSIS — L97529 Non-pressure chronic ulcer of other part of left foot with unspecified severity: Secondary | ICD-10-CM

## 2019-10-02 HISTORY — PX: WOUND DEBRIDEMENT: SHX247

## 2019-10-02 LAB — CBC
HCT: 33.3 % — ABNORMAL LOW (ref 39.0–52.0)
Hemoglobin: 10.7 g/dL — ABNORMAL LOW (ref 13.0–17.0)
MCH: 27.2 pg (ref 26.0–34.0)
MCHC: 32.1 g/dL (ref 30.0–36.0)
MCV: 84.7 fL (ref 80.0–100.0)
Platelets: 374 10*3/uL (ref 150–400)
RBC: 3.93 MIL/uL — ABNORMAL LOW (ref 4.22–5.81)
RDW: 11.9 % (ref 11.5–15.5)
WBC: 6.2 10*3/uL (ref 4.0–10.5)
nRBC: 0 % (ref 0.0–0.2)

## 2019-10-02 LAB — WOUND CULTURE
MICRO NUMBER:: 10640056
SPECIMEN QUALITY:: ADEQUATE

## 2019-10-02 LAB — BASIC METABOLIC PANEL
Anion gap: 9 (ref 5–15)
BUN: 10 mg/dL (ref 6–20)
CO2: 26 mmol/L (ref 22–32)
Calcium: 9.2 mg/dL (ref 8.9–10.3)
Chloride: 102 mmol/L (ref 98–111)
Creatinine, Ser: 0.96 mg/dL (ref 0.61–1.24)
GFR calc Af Amer: >60 mL/min (ref >60)
GFR calc non Af Amer: >60 mL/min (ref >60)
Glucose, Bld: 175 mg/dL — ABNORMAL HIGH (ref 70–99)
Potassium: 3.8 mmol/L (ref 3.5–5.1)
Sodium: 137 mmol/L (ref 135–145)

## 2019-10-02 LAB — SURGICAL PCR SCREEN
MRSA, PCR: POSITIVE — AB
Staphylococcus aureus: POSITIVE — AB

## 2019-10-02 LAB — GLUCOSE, CAPILLARY
Glucose-Capillary: 168 mg/dL — ABNORMAL HIGH (ref 70–99)
Glucose-Capillary: 180 mg/dL — ABNORMAL HIGH (ref 70–99)
Glucose-Capillary: 132 mg/dL — ABNORMAL HIGH (ref 70–99)
Glucose-Capillary: 107 mg/dL — ABNORMAL HIGH (ref 70–99)
Glucose-Capillary: 105 mg/dL — ABNORMAL HIGH (ref 70–99)
Glucose-Capillary: 113 mg/dL — ABNORMAL HIGH (ref 70–99)

## 2019-10-02 SURGERY — DEBRIDEMENT, WOUND
Anesthesia: Monitor Anesthesia Care | Laterality: Bilateral

## 2019-10-02 MED ORDER — MIDAZOLAM HCL 5 MG/5ML IJ SOLN
INTRAMUSCULAR | Status: DC | PRN
  Administered 2019-10-02: 2 mg via INTRAVENOUS

## 2019-10-02 MED ORDER — SODIUM CHLORIDE 0.9 % IV SOLN: INTRAVENOUS | Status: DC | PRN

## 2019-10-02 MED ORDER — LACTATED RINGERS IV SOLN: INTRAVENOUS | Status: DC

## 2019-10-02 MED ORDER — MUPIROCIN 2 % EX OINT
1.0000 "application " | TOPICAL_OINTMENT | Freq: Two times a day (BID) | CUTANEOUS | Status: DC
  Administered 2019-10-02 – 2019-10-04 (×4): 1 via NASAL
  Filled 2019-10-02 (×2): qty 22

## 2019-10-02 MED ORDER — 0.9 % SODIUM CHLORIDE (POUR BTL) OPTIME
TOPICAL | Status: DC | PRN
  Administered 2019-10-02: 1000 mL

## 2019-10-02 MED ORDER — OXYCODONE HCL 5 MG/5ML PO SOLN: 5.0000 mg | Freq: Once | ORAL | Status: DC | PRN

## 2019-10-02 MED ORDER — PROPOFOL 10 MG/ML IV BOLUS
INTRAVENOUS | Status: AC
  Filled 2019-10-02: qty 40

## 2019-10-02 MED ORDER — LIDOCAINE 2% (20 MG/ML) 5 ML SYRINGE
INTRAMUSCULAR | Status: DC | PRN
  Administered 2019-10-02: 60 mg via INTRAVENOUS

## 2019-10-02 MED ORDER — ONDANSETRON HCL 4 MG/2ML IJ SOLN
INTRAMUSCULAR | Status: DC | PRN
  Administered 2019-10-02: 4 mg via INTRAVENOUS

## 2019-10-02 MED ORDER — FENTANYL CITRATE (PF) 100 MCG/2ML IJ SOLN
25.0000 ug | INTRAMUSCULAR | Status: DC | PRN
  Administered 2019-10-02: 50 ug via INTRAVENOUS

## 2019-10-02 MED ORDER — PROPOFOL 500 MG/50ML IV EMUL
INTRAVENOUS | Status: DC | PRN
  Administered 2019-10-02: 75 ug/kg/min via INTRAVENOUS

## 2019-10-02 MED ORDER — ORAL CARE MOUTH RINSE: 15.0000 mL | Freq: Once | OROMUCOSAL | Status: AC

## 2019-10-02 MED ORDER — PROMETHAZINE HCL 25 MG/ML IJ SOLN: 6.2500 mg | INTRAMUSCULAR | Status: DC | PRN

## 2019-10-02 MED ORDER — PROPOFOL 10 MG/ML IV BOLUS
INTRAVENOUS | Status: DC | PRN
  Administered 2019-10-02: 50 mg via INTRAVENOUS

## 2019-10-02 MED ORDER — MIDAZOLAM HCL 2 MG/2ML IJ SOLN
INTRAMUSCULAR | Status: AC
  Filled 2019-10-02: qty 2

## 2019-10-02 MED ORDER — DIPHENHYDRAMINE HCL 50 MG/ML IJ SOLN
INTRAMUSCULAR | Status: AC
  Filled 2019-10-02: qty 1

## 2019-10-02 MED ORDER — ONDANSETRON HCL 4 MG/2ML IJ SOLN
INTRAMUSCULAR | Status: AC
  Filled 2019-10-02: qty 2

## 2019-10-02 MED ORDER — FENTANYL CITRATE (PF) 100 MCG/2ML IJ SOLN
INTRAMUSCULAR | Status: AC
  Filled 2019-10-02: qty 2

## 2019-10-02 MED ORDER — SODIUM CHLORIDE 0.9 % IR SOLN
Status: DC | PRN
  Administered 2019-10-02: 1000 mL

## 2019-10-02 MED ORDER — FENTANYL CITRATE (PF) 250 MCG/5ML IJ SOLN
INTRAMUSCULAR | Status: DC | PRN
  Administered 2019-10-02: 50 ug via INTRAVENOUS

## 2019-10-02 MED ORDER — CHLORHEXIDINE GLUCONATE 0.12 % MT SOLN
15.0000 mL | Freq: Once | OROMUCOSAL | Status: AC
  Administered 2019-10-02: 15 mL via OROMUCOSAL

## 2019-10-02 MED ORDER — CHLORHEXIDINE GLUCONATE CLOTH 2 % EX PADS
6.0000 | MEDICATED_PAD | Freq: Every day | CUTANEOUS | Status: DC
  Administered 2019-10-02 – 2019-10-04 (×3): 6 via TOPICAL

## 2019-10-02 MED ORDER — DIPHENHYDRAMINE HCL 50 MG/ML IJ SOLN
INTRAMUSCULAR | Status: DC | PRN
Start: 2019-10-02 — End: 2019-10-02
  Administered 2019-10-02: 6.25 mg via INTRAVENOUS

## 2019-10-02 MED ORDER — BUPIVACAINE HCL (PF) 0.5 % IJ SOLN
INTRAMUSCULAR | Status: DC | PRN
  Administered 2019-10-02: 10 mL

## 2019-10-02 MED ORDER — OXYCODONE HCL 5 MG PO TABS: 5.0000 mg | ORAL_TABLET | Freq: Once | ORAL | Status: DC | PRN

## 2019-10-02 MED ORDER — VANCOMYCIN HCL 1000 MG IV SOLR
INTRAVENOUS | Status: AC
  Filled 2019-10-02: qty 1000

## 2019-10-02 MED ORDER — BUPIVACAINE HCL (PF) 0.5 % IJ SOLN
INTRAMUSCULAR | Status: AC
  Filled 2019-10-02: qty 30

## 2019-10-02 MED ORDER — CHLORHEXIDINE GLUCONATE 0.12 % MT SOLN
15.0000 mL | OROMUCOSAL | Status: AC
  Filled 2019-10-02: qty 15

## 2019-10-02 MED ORDER — FENTANYL CITRATE (PF) 250 MCG/5ML IJ SOLN
INTRAMUSCULAR | Status: AC
  Filled 2019-10-02: qty 5

## 2019-10-02 SURGICAL SUPPLY — 31 items
BNDG ELASTIC 4X5.8 VLCR STR LF (GAUZE/BANDAGES/DRESSINGS) ×4 IMPLANT
BNDG GAUZE ELAST 4 BULKY (GAUZE/BANDAGES/DRESSINGS) ×4 IMPLANT
CNTNR URN SCR LID CUP LEK RST (MISCELLANEOUS) ×3 IMPLANT
CONT SPEC 4OZ STRL OR WHT (MISCELLANEOUS) ×3
DRAPE EXTREMITY BILATERAL (DRAPES) ×2 IMPLANT
ELECT REM PT RETURN 9FT ADLT (ELECTROSURGICAL) ×2
ELECTRODE REM PT RTRN 9FT ADLT (ELECTROSURGICAL) ×1 IMPLANT
GAUZE SPONGE 4X4 12PLY STRL (GAUZE/BANDAGES/DRESSINGS) ×6 IMPLANT
GAUZE XEROFORM 5X9 LF (GAUZE/BANDAGES/DRESSINGS) ×2 IMPLANT
GLOVE BIO SURGEON STRL SZ7.5 (GLOVE) ×2 IMPLANT
GLOVE BIOGEL PI IND STRL 8 (GLOVE) ×2 IMPLANT
GLOVE BIOGEL PI INDICATOR 8 (GLOVE) ×2
GOWN STRL REUS W/ TWL LRG LVL3 (GOWN DISPOSABLE) ×1 IMPLANT
GOWN STRL REUS W/TWL 2XL LVL3 (GOWN DISPOSABLE) ×2 IMPLANT
GOWN STRL REUS W/TWL LRG LVL3 (GOWN DISPOSABLE) ×1
KIT BASIN OR (CUSTOM PROCEDURE TRAY) ×2 IMPLANT
MANIFOLD NEPTUNE II (INSTRUMENTS) ×2 IMPLANT
NEEDLE BIOPSY JAMSHIDI 8X6 (NEEDLE) ×4 IMPLANT
NEEDLE HYPO 25GX1X1/2 BEV (NEEDLE) ×2 IMPLANT
NS IRRIG 1000ML POUR BTL (IV SOLUTION) ×2 IMPLANT
PACK ORTHO EXTREMITY (CUSTOM PROCEDURE TRAY) ×2 IMPLANT
PAD ARMBOARD 7.5X6 YLW CONV (MISCELLANEOUS) ×2 IMPLANT
PROBE DEBRIDE SONICVAC MISONIX (TIP) ×2 IMPLANT
SOL PREP POV-IOD 4OZ 10% (MISCELLANEOUS) ×4 IMPLANT
STOCKINETTE 6  STRL (DRAPES) ×1
STOCKINETTE 6 STRL (DRAPES) ×1 IMPLANT
SYR CONTROL 10ML LL (SYRINGE) ×2 IMPLANT
TOWEL GREEN STERILE FF (TOWEL DISPOSABLE) ×2 IMPLANT
TUBE CONNECTING 12X1/4 (SUCTIONS) ×2 IMPLANT
TUBE IRRIGATION SET MISONIX (TUBING) ×2 IMPLANT
YANKAUER SUCT BULB TIP NO VENT (SUCTIONS) ×2 IMPLANT

## 2019-10-02 NOTE — Progress Notes (Signed)
PROGRESS NOTE        PATIENT DETAILS Name: [name] Age: 34 y.o. Sex: male Date of Birth: 1985/05/02 Admit Date: 09/28/2019 Admitting Physician Clydie Braun, MD ZOX:WRUEAV, Windy Fast, MD  Brief Narrative: Patient is a 34 y.o. 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  See below for further details.  Significant events: 6/26>> admit to [facility] for diabetic foot infection.  Significant studies: 6/27: MRI right foot>> T2 hyperintensity within the fourth/fifth metatarsal heads-nonspecific but could be secondary to osteomyelitis, hyperemia, early bone marrow edema 6/27: MRI left foot>> small peripheral enhancing fluid collections along the plantar/lateral aspect of fifth metatarsal base-suspicious for abscess 6/26: Lower extremity Doppler>> no DVT  Antimicrobial therapy: Rocephin: 6/26>> Flagyl: 6/26>> Vancomycin: 6/26>>  Microbiology data: 6/26: Blood cultures>> no growth  6/26: Wound culture>> MRSA  Procedures : None  Consults: ID Podiatry  DVT Prophylaxis : enoxaparin (LOVENOX) injection 40 mg Start: 09/28/19 1800  Subjective: No major issues overnight-lying comfortably in bed.  Denies any chest pain or shortness of breath.  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  Culture data as above.  DM-1 with uncontrolled hyperglycemia (A1c 11.7 on 6/26): CBG stable overnight-continue Lantus 40 units and SSI.  Follow and optimize.  CBG (last 3)  Recent Labs    10/01/19 1630 10/01/19 2207 10/02/19 0739  GLUCAP 171* 207* 168*   Normocytic anemia: Related to acute illness-appears mild-follow.  Nutrition Problem: Nutrition Problem: Increased nutrient needs Etiology: wound  healing Signs/Symptoms: estimated needs Interventions: MVI, Magic cup, Premier Protein  Obesity: Estimated body mass index is 30.83 kg/m as calculated from the following:   Height as of this encounter:  (1.956 m).   Weight as of this encounter: 117.9 kg.   Diet: Diet Order            Diet NPO time specified  Diet effective midnight           Diet NPO time specified  Diet effective 1000                  Code Status: Full code   Family Communication: None at bedside  Disposition Plan: Status is: Inpatient  Remains inpatient appropriate because:Inpatient level of care appropriate due to severity of illness  Dispo:  Patient From: Home   Planned Disposition: Home  Expected discharge date: 10/02/19  Medically stable for discharge: No  Barriers to Discharge: Bilateral diabetic foot infections with possible right foot osteomyelitis-on IV antibiotics-for incision and drainage in the operating room later today.  Antimicrobial agents: Anti-infectives (From admission, onward)   Start     Dose/Rate Route Frequency Ordered Stop   10/02/19 0100  vancomycin (VANCOREADY) IVPB 750 mg/150 mL     Discontinue     750 mg 150 mL/hr over 60 Minutes Intravenous Every 8 hours 10/01/19 2038     09/30/19 0100  vancomycin (VANCOCIN) IVPB 1000 mg/200 mL premix  Status:  Discontinued        1,000 mg 200 mL/hr over 60 Minutes Intravenous Every 8 hours 09/29/19 1656 10/01/19 2038   09/29/19 1930  vancomycin (VANCOCIN) IVPB 1000 mg/200 mL premix  Status:  Discontinued       "Followed  by" Linked Group Details   1,000 mg 200 mL/hr over 60 Minutes Intravenous Every 8 hours 09/29/19 1029 09/29/19 1656   09/29/19 1700  vancomycin (VANCOCIN) 2,500 mg in sodium chloride 0.9 % 500 mL IVPB        2,500 mg 250 mL/hr over 120 Minutes Intravenous  Once 09/29/19 1656 09/29/19 1944   09/29/19 1130  vancomycin (VANCOCIN) 2,500 mg in sodium chloride 0.9 % 500 mL IVPB  Status:  Discontinued        "Followed by" Linked Group Details   2,500 mg 250 mL/hr over 120 Minutes Intravenous  Once 09/29/19 1029 09/29/19 1656   09/28/19 1700  cefTRIAXone (ROCEPHIN) 2 g in sodium chloride 0.9 % 100 mL IVPB     Discontinue    "And" Linked Group Details   2 g 200 mL/hr over 30 Minutes Intravenous Every 24 hours 09/28/19 1606     09/28/19 1700  metroNIDAZOLE (FLAGYL) tablet 500 mg     Discontinue    "And" Linked Group Details   500 mg Oral Every 8 hours 09/28/19 1606         Time spent: 25- minutes-Greater than 50% of this time was spent in counseling, explanation of diagnosis, planning of further management, and coordination of care.  MEDICATIONS: Scheduled Meds: . Chlorhexidine Gluconate Cloth  6 each Topical Q0600  . enoxaparin (LOVENOX) injection  40 mg Subcutaneous Q24H  . insulin aspart  0-5 Units Subcutaneous QHS  . insulin aspart  0-9 Units Subcutaneous TID WC  . insulin glargine  40 Units Subcutaneous QAC breakfast  . metroNIDAZOLE  500 mg Oral Q8H  . multivitamin with minerals  1 tablet Oral Daily  . mupirocin ointment  1 application Nasal BID  . Ensure Max Protein  11 oz Oral BID  . sodium chloride flush  3 mL Intravenous Once   Continuous Infusions: . sodium chloride 10 mL/hr at 10/02/19 0300  . cefTRIAXone (ROCEPHIN)  IV 2 g (10/01/19 1726)  . vancomycin 750 mg (10/02/19 0822)   PRN Meds:.sodium chloride, acetaminophen **OR** acetaminophen, albuterol, HYDROcodone-acetaminophen, ondansetron **OR** ondansetron (ZOFRAN) IV   PHYSICAL EXAM: Vital signs: Vitals:   10/01/19 1343 10/01/19 1951 10/02/19 0302 10/02/19 0742  BP: (!) 145/88 (!) 148/96 (!) 111/55 (!) 141/81  Pulse: 94 90 70 80  Resp: 19 12 20 18   Temp: 98 F (36.7 C) 98.2 F (36.8 C) 98 F (36.7 C) 98 F (36.7 C)  TempSrc: Oral Oral Oral Oral  SpO2: 100% 100% 98% 100%  Weight:      Height:       Filed Weights   09/28/19 1135  Weight: 117.9 kg   Body mass index is 30.83 kg/m.   Gen Exam:Alert  awake-not in any distress HEENT:atraumatic, normocephalic Chest: B/L clear to auscultation anteriorly CVS:S1S2 regular Abdomen:soft non tender, non distended Extremities:no edema Neurology: Non focal Skin: no rash  I have personally reviewed following labs and imaging studies  LABORATORY DATA: CBC: Recent Labs  Lab 09/28/19 1151 09/29/19 0506 09/30/19 0430 10/02/19 0403  WBC 6.5 5.4 5.4 6.2  NEUTROABS 3.8  --   --   --   HGB 10.8* 10.1* 10.0* 10.7*  HCT 34.9* 32.4* 31.8* 33.3*  MCV 86.6 85.7 85.3 84.7  PLT 328 285 304 374    Basic Metabolic Panel: Recent Labs  Lab 09/28/19 1151 09/29/19 0506 09/30/19 0430 10/02/19 0403  NA 136 137 137 137  K 4.6 4.3 4.6 3.8  CL 98 98 100 102  CO2  28 31 30 26   GLUCOSE 215* 214* 299* 175*  BUN 10 11 9 10   CREATININE 1.02 0.93 0.93 0.96  CALCIUM 9.8 9.2 9.2 9.2    GFR: Estimated Creatinine Clearance: 155.7 mL/min (by C-G formula based on SCr of 0.96 mg/dL).  Liver Function Tests: Recent Labs  Lab 09/28/19 1151  AST 12*  ALT 13  ALKPHOS 77  BILITOT 0.4  PROT 8.0  ALBUMIN 2.8*   No results for input(s): LIPASE, AMYLASE in the last 168 hours. No results for input(s): AMMONIA in the last 168 hours.  Coagulation Profile: Recent Labs  Lab 09/28/19 1420  INR 1.1    Cardiac Enzymes: No results for input(s): CKTOTAL, CKMB, CKMBINDEX, TROPONINI in the last 168 hours.  BNP (last 3 results) No results for input(s): PROBNP in the last 8760 hours.  Lipid Profile: No results for input(s): CHOL, HDL, LDLCALC, TRIG, CHOLHDL, LDLDIRECT in the last 72 hours.  Thyroid Function Tests: No results for input(s): TSH, T4TOTAL, FREET4, T3FREE, THYROIDAB in the last 72 hours.  Anemia Panel: No results for input(s): VITAMINB12, FOLATE, FERRITIN, TIBC, IRON, RETICCTPCT in the last 72 hours.  Urine analysis:    Component Value Date/Time   COLORURINE YELLOW 08/20/2019 0455   APPEARANCEUR CLEAR 08/20/2019 0455   LABSPEC 1.015  08/20/2019 0455   PHURINE 7.0 08/20/2019 0455   GLUCOSEU 50 (A) 08/20/2019 0455   HGBUR NEGATIVE 08/20/2019 0455   BILIRUBINUR NEGATIVE 08/20/2019 0455   KETONESUR NEGATIVE 08/20/2019 0455   PROTEINUR NEGATIVE 08/20/2019 0455   UROBILINOGEN 0.2 11/07/2011 2241   NITRITE NEGATIVE 08/20/2019 0455   LEUKOCYTESUR NEGATIVE 08/20/2019 0455    Sepsis Labs: Lactic Acid, Venous    Component Value Date/Time   LATICACIDVEN 0.8 09/28/2019 1151    MICROBIOLOGY: Recent Results (from the past 240 hour(s))  WOUND CULTURE     Status: Abnormal   Collection Time: 09/28/19 12:08 PM   Specimen: Foot, Right; Wound  Result Value Ref Range Status   MICRO NUMBER: 09/30/2019  Final   SPECIMEN QUALITY: Adequate  Final   SOURCE: FOOT, RIGHT  Final   STATUS: FINAL  Final   GRAM STAIN: Gram positive cocci in chains  Final    Comment: Few White blood cells seen Many epithelial cells Moderate Gram positive cocci in clusters Moderate Gram positive cocci in chains   ISOLATE 1: methicillin resistant Staphylococcus aureus (A)  Final    Comment: Heavy growth of Methicillin resistant Staphylococcus aureus (MRSA) Negative for inducible clindamycin resistance.      Susceptibility   Methicillin resistant staphylococcus aureus - AEROBIC CULT, GRAM STAIN POSITIVE 1    VANCOMYCIN 1 Sensitive     CIPROFLOXACIN >=8 Resistant     CLINDAMYCIN <=0.25 Sensitive     LEVOFLOXACIN 4 Resistant     ERYTHROMYCIN >=8 Resistant     GENTAMICIN <=0.5 Sensitive     OXACILLIN* >=4 Resistant      * Oxacillin-resistant staphylococci are resistant toall currently available beta-lactam antimicrobialagents including penicillins, beta lactam/beta-lactamase inhibitor combinations, and cephems withstaphylococcal indications, including Cefazolin.    TETRACYCLINE <=1 Sensitive     TRIMETH/SULFA* <=10 Sensitive      * Oxacillin-resistant staphylococci are resistant toall currently available beta-lactam antimicrobialagents including penicillins,  beta lactam/beta-lactamase inhibitor combinations, and cephems withstaphylococcal indications, including Cefazolin.Legend:S = Susceptible  I = IntermediateR = Resistant  NS = Not susceptible* = Not tested  NR = Not reported**NN = See antimicrobic comments  SARS Coronavirus 2 by RT PCR (hospital order, performed in [facility] hospital lab) Nasopharyngeal Nasopharyngeal Swab     Status: None   Collection Time: 09/28/19  3:14 PM   Specimen: Nasopharyngeal Swab  Result Value Ref Range Status   SARS Coronavirus 2 NEGATIVE NEGATIVE Final    Comment: (NOTE) SARS-CoV-2 target nucleic acids are NOT DETECTED.  The SARS-CoV-2 RNA is generally detectable in upper and lower respiratory specimens during the acute phase of infection. The lowest concentration of SARS-CoV-2 viral copies this assay can detect is 250 copies / mL. A negative result does not preclude SARS-CoV-2 infection and should not be used as the sole basis for treatment or other patient management decisions.  A negative result may occur with improper specimen collection / handling, submission of specimen other than nasopharyngeal swab, presence of viral mutation(s) within the areas targeted by this assay, and inadequate number of viral copies (<250 copies / mL). A negative result must be combined with clinical observations, patient history, and epidemiological information.  Fact Sheet for Patients:   BoilerBrush.com.cyhttps://www.fda.gov/media/136312/download  Fact Sheet for Healthcare Providers: https://pope.com/https://www.fda.gov/media/136313/download  This test is not yet approved or  cleared by the [place] FDA and has been authorized for detection and/or diagnosis of SARS-CoV-2 by FDA under an Emergency Use Authorization (EUA).  This EUA will remain in effect (meaning this test can be used) for the duration of the COVID-19 declaration under Section 564(b)(1) of the Act, 21 U.S.C. section 360bbb-3(b)(1), unless the authorization is terminated or revoked  sooner.  Performed at [facility], [address] [place], [place] [address]   Blood Cultures x 2 sites     Status: None (Preliminary result)   Collection Time: 09/28/19  7:16 PM   Specimen: BLOOD  Result Value Ref Range Status   Specimen Description BLOOD RIGHT ANTECUBITAL  Final   Special Requests   Final    BOTTLES DRAWN AEROBIC AND ANAEROBIC Blood Culture adequate volume   Culture   Final    NO GROWTH 4 DAYS Performed at [facility], [address] [place], [place] [address]    Report Status PENDING  Incomplete  Blood Cultures x 2 sites     Status: None (Preliminary result)   Collection Time: 09/28/19  7:24 PM   Specimen: BLOOD RIGHT FOREARM  Result Value Ref Range Status   Specimen Description BLOOD RIGHT FOREARM  Final   Special Requests   Final    BOTTLES DRAWN AEROBIC AND ANAEROBIC Blood Culture adequate volume   Culture   Final    NO GROWTH 4 DAYS Performed at [facility], [address] [place], [place] [address]    Report Status PENDING  Incomplete  Surgical pcr screen     Status: Abnormal   Collection Time: 10/02/19 12:48 AM   Specimen: Nasal Mucosa; Nasal Swab  Result Value Ref Range Status   MRSA, PCR POSITIVE (A) NEGATIVE Final    Comment: RESULT CALLED TO, READ BACK BY AND VERIFIED WITH: RHINEHART,D RN AT 0329 10/02/2019 MITCHELL,L    Staphylococcus aureus POSITIVE (A) NEGATIVE Final    Comment: (NOTE) The Xpert SA Assay (FDA approved for NASAL specimens in patients 33 years of age and older), is one component of a comprehensive surveillance program. It is not intended to diagnose infection nor to guide or monitor treatment. Performed at [facility], [address] [place], [place] [address]     RADIOLOGY STUDIES/RESULTS: DG Foot Complete Left  Result Date: 09/30/2019 Please see detailed radiograph report in office note.  DG Foot Complete Right  Result Date: 09/30/2019 Please see detailed radiograph report in office  note.    LOS: 4 days   Jeoffrey Massed, MD  [facility]    To contact the attending provider between 7A-7P or the covering provider during after hours 7P-7A, please log into the web site [email or web address] and access using universal [place] password for that web site. If you do not have the password, please call the hospital operator.  10/02/2019, 10:39 AM

## 2019-10-02 NOTE — Progress Notes (Signed)
Patient ID: [name], male   DOB: 10/03/1985, 34 y.o.   MRN: [id number]          [facility]    Date of Admission:  09/28/2019   Total days of antibiotics 5         Max has polymicrobial deep infections of both feet.  Past cultures have grown Serratia, staph lugdunensis and Enterococcus.  His most recent abscess cultures from his left foot has grown MRSA.  I am certain that anaerobes are present but simply have not been isolated.  He has now agreed to surgery which is planned for later today.  He is agreeable with PICC placement.  I will continue current antibiotics for now.         Cliffton Asters, MD [facility] [phone] pager   [phone] cell 10/02/2019, 1:28 PM

## 2019-10-02 NOTE — Anesthesia Postprocedure Evaluation (Signed)
Anesthesia Post Note  Patient: [name]  Procedure(s) Performed: DEBRIDEMENT WOUNDS BOTH LOWER EXTREMITIES WITH BONE BIOPSIES (Bilateral )     Patient location during evaluation: PACU Anesthesia Type: MAC Level of consciousness: awake and alert Pain management: pain level controlled Vital Signs Assessment: post-procedure vital signs reviewed and stable Respiratory status: spontaneous breathing, nonlabored ventilation and respiratory function stable Cardiovascular status: stable and blood pressure returned to baseline Anesthetic complications: no   No complications documented.  Last Vitals:  Vitals:   10/02/19 2145 10/02/19 2158  BP: 129/84 140/85  Pulse: 76 86  Resp: 16 18  Temp: 36.6 C 36.7 C  SpO2: 100% 100%    Last Pain:  Vitals:   10/02/19 2158  TempSrc: Oral  PainSc:                  Audry Pili

## 2019-10-02 NOTE — Progress Notes (Signed)
Inpatient Diabetes Program Recommendations  AACE/ADA: New Consensus Statement on Inpatient Glycemic Control (2015)  Target Ranges:  Prepandial:   less than 140 mg/dL      Peak postprandial:   less than 180 mg/dL (1-2 hours)      Critically ill patients:  140 - 180 mg/dL   Lab Results  Component Value Date   GLUCAP 180 (H) 10/02/2019   HGBA1C 11.7 (H) 09/28/2019    Review of Glycemic Control Results for [name], [name] (MRN [id number]) as of 10/02/2019 13:05  Ref. Range 10/01/2019 16:30 10/01/2019 22:07 10/02/2019 07:39 10/02/2019 12:06  Glucose-Capillary Latest Ref Range: 70 - 99 mg/dL 005 (H) 110 (H) 211 (H) 180 (H)   Diabetes history:  DM1  Outpatient Diabetes medications:  Lantus 40 units daily  Novolog 7-12 units tid  Current orders for Inpatient glycemic control:  Lantus 40 units qhs  Novolog 0-9 units tid   Inpatient Diabetes Program Recommendations:     Novolog 2 units meal coverage tid with meals if eats at least 50%.  Thanks, Max Rave, MSN, RNC-OB Diabetes Coordinator [phone] (8a-5p)

## 2019-10-02 NOTE — Progress Notes (Signed)
Nutrition Follow-up  RD working remotely.  DOCUMENTATION CODES:   Obesity unspecified  INTERVENTION:   -Continue MVI with minerals daily -Continue Ensure Max po BID, each supplement provides 150 kcal and 30 grams of protein.  -Continue Magic cup TID with meals, each supplement provides 290 kcal and 9 grams of protein  NUTRITION DIAGNOSIS:   Increased nutrient needs related to wound healing as evidenced by estimated needs.  Ongoing  GOAL:   Patient will meet greater than or equal to 90% of their needs  Progressing   MONITOR:   PO intake, Supplement acceptance, Labs, Weight trends, Skin, I & O's  REASON FOR ASSESSMENT:   Consult Wound healing  ASSESSMENT:   [name] Segel is a 34 y.o. male with medical history significant of diabetes mellitus type 1 uncontrolled, chronic diabetic foot ulcers, and s/p partial left fifth ray amputation by Dr. Victorino Dike in February who presents from podiatry clinic with complaint of worsening wound of his right foot.  Reviewed I/O's: +1.9 L x 24 hours and +2.3 L since admission  Per podiatry notes, plan for I&D to bilateral feet and bone biopsy today (will be NPO after 10 PM).   Attempted to speak with pt via phone, however, no answer.   Pt remains with good appetite; noted meal completion 100%.   Per DM coordinator note, pt with difficulty obtain and affording medications and specialisyt visits.   Labs reviewed: CBGS: 168-207 (inpatient orders for glycemic control are 0-5 units insulin aspart daily at bedtime, 0-9 units insulin aspart TID with meals, and 40 units insulin glargine daily before breakfast).   Diet Order:   Diet Order            Diet NPO time specified  Diet effective midnight           Diet NPO time specified  Diet effective 1000           Diet Carb Modified Fluid consistency: Thin; Room service appropriate? Yes  Diet effective now                 EDUCATION NEEDS:   No education needs have been identified at  this time  Skin:  Skin Assessment: Skin Integrity Issues: Skin Integrity Issues:: Diabetic Ulcer Diabetic Ulcer: rt lateral foot, rt dorsal foot, lt lateral foot  Last BM:  09/29/19  Height:   Ht Readings from Last 1 Encounters:  09/28/19 6\' 5"  (1.956 m)    Weight:   Wt Readings from Last 1 Encounters:  09/28/19 117.9 kg    Ideal Body Weight:  94.5 kg  BMI:  Body mass index is 30.83 kg/m.  Estimated Nutritional Needs:   Kcal:  2400-2600  Protein:  125-150 grams  Fluid:  > 2.4 L    09/30/19, RD, LDN, CDCES Registered Dietitian II Certified Diabetes Care and Education Specialist Please refer to [facility] for RD and/or RD on-call/weekend/after hours pager

## 2019-10-02 NOTE — Progress Notes (Signed)
Patient seen in pre-op. Scheduled for bilateral foot debridements and bone biopsy. I have spoken to him on several occasions and he also spoke with Dr. Samuella Cota last night He is in agreement to proceeding with surgery.   NPO since 10am.   Surgical consent signed. He has no further questions or concerns. Will call his aunt postop with his consent.   Ovid Curd, DPM

## 2019-10-02 NOTE — Transfer of Care (Signed)
Immediate Anesthesia Transfer of Care Note  Patient: [name]  Procedure(s) Performed: DEBRIDEMENT WOUNDS BOTH LOWER EXTREMITIES WITH BONE BIOPSIES (Bilateral )  Patient Location: PACU  Anesthesia Type:MAC  Level of Consciousness: awake, alert , oriented and patient cooperative  Airway & Oxygen Therapy: Patient Spontanous Breathing  Post-op Assessment: Report given to RN and Post -op Vital signs reviewed and stable  Post vital signs: Reviewed and stable  Last Vitals:  Vitals Value Taken Time  BP    Temp    Pulse 70 10/02/19 2038  Resp 14 10/02/19 2038  SpO2 99 % 10/02/19 2038  Vitals shown include unvalidated device data.  Last Pain:  Vitals:   10/02/19 1833  TempSrc:   PainSc: 0-No pain      Patients Stated Pain Goal: 4 (76/16/07 3710)  Complications: No complications documented.

## 2019-10-02 NOTE — Anesthesia Procedure Notes (Signed)
Procedure Name: MAC Date/Time: 10/02/2019 7:27 PM Performed by: Jearld Pies, CRNA Pre-anesthesia Checklist: Patient identified, Emergency Drugs available, Suction available, Patient being monitored and Timeout performed Patient Re-evaluated:Patient Re-evaluated prior to induction Oxygen Delivery Method: Simple face mask

## 2019-10-02 NOTE — Anesthesia Preprocedure Evaluation (Addendum)
Anesthesia Evaluation  Patient identified by MRN, date of birth, ID band Patient awake    Reviewed: Allergy & Precautions, NPO status , Patient's Chart, lab work & pertinent test results  History of Anesthesia Complications Negative for: history of anesthetic complications  Airway Mallampati: I  TM Distance: >3 FB Neck ROM: Full    Dental  (+) Dental Advisory Given, Teeth Intact   Pulmonary neg pulmonary ROS,    Pulmonary exam normal        Cardiovascular negative cardio ROS Normal cardiovascular exam     Neuro/Psych negative neurological ROS  negative psych ROS   GI/Hepatic negative GI ROS, Neg liver ROS,   Endo/Other  diabetes (MODY), Poorly Controlled, Insulin Dependent Obesity   Renal/GU negative Renal ROS     Musculoskeletal negative musculoskeletal ROS (+)   Abdominal   Peds  Hematology  (+) anemia ,   Anesthesia Other Findings Hx Covid pneumonia, resolved Covid test negative    Reproductive/Obstetrics                            Anesthesia Physical Anesthesia Plan  ASA: III  Anesthesia Plan: MAC   Post-op Pain Management:    Induction: Intravenous  PONV Risk Score and Plan: 1 and Treatment may vary due to age or medical condition and Propofol infusion  Airway Management Planned: Nasal Cannula and Natural Airway  Additional Equipment: None  Intra-op Plan:   Post-operative Plan:   Informed Consent: I have reviewed the patients History and Physical, chart, labs and discussed the procedure including the risks, benefits and alternatives for the proposed anesthesia with the patient or authorized representative who has indicated his/her understanding and acceptance.     Dental advisory given  Plan Discussed with: CRNA, Anesthesiologist and Surgeon  Anesthesia Plan Comments:       Anesthesia Quick Evaluation

## 2019-10-02 NOTE — Plan of Care (Addendum)
BLE dressings changed per order. Pt tolerated well.   Problem: Clinical Measurements: Goal: Will remain free from infection Outcome: Progressing   Problem: Activity: Goal: Risk for activity intolerance will decrease Outcome: Progressing

## 2019-10-02 NOTE — TOC Initial Note (Signed)
Transition of Care [facility]) - Initial/Assessment Note    Patient Details  Name: [name] MRN: [id number] Date of Birth: 09/02/1985  Transition of Care [facility]) CM/SW Contact:    Epifanio Lesches, RN Phone Number: [phone] 10/02/2019, 33:43 PM  Clinical Narrative:     Admitted with diabetic foot infection, failed outpt  treatment. 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 From home with girlfriend and 33 y/o son. Pt will probably need LT ABX therapy once d/c per ID. Pt states aunt from [place] will help with home infusion therapy.  Referral made with [facility]urb[facility]  ( RN) and [facility] ( LT abx therapy) for home health services and both accepted.  Plan: Bilateral foot debridement, bone biopsies, 6/30.    TOC  Team will continue to follow and monitor for needs .Marland Kitchen...  Expected Discharge Plan: Home w Home Health Services Barriers to Discharge: Continued Medical Work up   Patient Goals and CMS Choice     Choice offered to / list presented to : Patient  Expected Discharge Plan and Services Expected Discharge Plan: Home w Home Health Services   Discharge Planning Services: CM Consult   Living arrangements for the past 2 months: Apartment                 DME Arranged: Other see comment (LT IV ABX therapy) DME Agency: Other - Comment ([facility]) Date DME Agency Contacted: 10/02/19 Time DME Agency Contacted: [phone] Representative spoke with at DME Agency: Pam HH Arranged: RN HH Agency: [facility] Date [facility] Agency Contacted: 10/02/19 Time HH Agency Contacted: [phone] Representative spoke with at [facility] Agency: Arline Asp  Prior Living Arrangements/Services Living arrangements for the past 2 months: Apartment Lives with:: Other (Comment) (girlfriend)   Do you feel safe going back to the place where you live?: Yes               Activities of Daily Living Home Assistive Devices/Equipment: None ADL Screening (condition at time of  admission) Patient's cognitive ability adequate to safely complete daily activities?: Yes Is the patient deaf or have difficulty hearing?: No Does the patient have difficulty seeing, even when wearing glasses/contacts?: No Does the patient have difficulty concentrating, remembering, or making decisions?: No Patient able to express need for assistance with ADLs?: Yes Does the patient have difficulty dressing or bathing?: No Independently performs ADLs?: Yes (appropriate for developmental age) Does the patient have difficulty walking or climbing stairs?: No Weakness of Legs: None Weakness of Arms/Hands: None  Permission Sought/Granted                  Emotional Assessment       Orientation: : Oriented to Self, Oriented to Place, Oriented to  Time, Oriented to Situation   Psych Involvement: No (comment)  Admission diagnosis:  Leg swelling [M79.89] Cellulitis of right lower extremity [L03.115] Diabetic foot infection (HCC) [V40.086, L08.9] Diabetic foot ulcer associated with type 1 diabetes mellitus, unspecified laterality, unspecified part of foot, unspecified ulcer stage (HCC) [P61.950, L97.509] Patient Active Problem List   Diagnosis Date Noted  . Diabetic foot ulcer associated with type 1 diabetes mellitus (HCC)   . Leg swelling   . Cellulitis of right lower extremity   . 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 Diabetic foot infection (HCC) 09/28/2019  . Normocytic anemia 09/28/2019  . Severe nonproliferative diabetic retinopathy of right eye, with macular edema, associated with type 1 diabetes  mellitus (HCC) 07/18/2019  . Severe nonproliferative diabetic retinopathy of left eye, with macular edema, associated with type 1 diabetes mellitus (HCC) 07/18/2019  . Diabetic cataract (HCC) 07/18/2019  . Osteomyelitis of ankle or foot, acute, left (HCC) 03/11/2019  . Pneumonia due to severe acute respiratory syndrome coronavirus 2 (SARS-CoV-2) 02/12/2019  . Pneumonia  due to COVID-19 virus 02/07/2019  . Acute respiratory failure with hypoxia (HCC) 02/07/2019  . Uncontrolled type 1 diabetes mellitus with hyperglycemia (HCC) 02/07/2019  . COVID-19 virus infection 02/04/2019  . Sepsis (HCC) 02/03/2019  . 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 AKI (acute kidney injury) (HCC) 02/03/2019  . CAP (community acquired pneumonia) 06/28/2017  . 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 Hyperglycemia without ketosis 01/19/2013   PCP:  [name], MD Pharmacy:   [id number] [place] [address], [facility] - [phone] [place] [address] Phone: [phone] Fax: [phone]     Social Determinants of Health (SDOH) Interventions    Readmission Risk Interventions No flowsheet data found.

## 2019-10-02 NOTE — Brief Op Note (Signed)
10/02/2019  8:29 PM  PATIENT:  [name]  34 y.o. male  PRE-OPERATIVE DIAGNOSIS:  Diabetic ulcers both lower extremities  POST-OPERATIVE DIAGNOSIS:  Diabetic ulcers both lower extremities  PROCEDURE:  Procedure(s): DEBRIDEMENT WOUNDS BOTH LOWER EXTREMITIES WITH BONE BIOPSIES (Bilateral)  SURGEON:  Surgeon(s) and Role:    Vivi Barrack, DPM - Primary  PHYSICIAN ASSISTANT:   ASSISTANTS: none   ANESTHESIA:   none  EBL:  5 mL   BLOOD ADMINISTERED:none  DRAINS: none   LOCAL MEDICATIONS USED:  OTHER 10 cc lidocaine and marcaine plain  SPECIMEN:  Source of Specimen:  bone culture bilateral   DISPOSITION OF SPECIMEN:  PATHOLOGY  COUNTS:  YES  TOURNIQUET:  * No tourniquets in log *  DICTATION: .Dragon Dictation  PLAN OF CARE: Admit to inpatient   PATIENT DISPOSITION:  PACU - hemodynamically stable.   Delay start of Pharmacological VTE agent (>24hrs) due to surgical blood loss or risk of bleeding: no  Intraoperative findings: Debrided wounds bilaterally. No abscess today. Bone cultures preformed and passed off the table and identified.   Plan to await bone culture results. Continue antibiotics per ID and local wound care.

## 2019-10-02 NOTE — Progress Notes (Signed)
Patient refused PICC insertion today.Stated he will think about it.RN aware.Pt has 1 working PIV .

## 2019-10-03 ENCOUNTER — Encounter (HOSPITAL_COMMUNITY): Payer: Self-pay | Admitting: Podiatry

## 2019-10-03 LAB — CBC
HCT: 34.4 % — ABNORMAL LOW (ref 39.0–52.0)
Hemoglobin: 10.6 g/dL — ABNORMAL LOW (ref 13.0–17.0)
MCH: 26.2 pg (ref 26.0–34.0)
MCHC: 30.8 g/dL (ref 30.0–36.0)
MCV: 85.1 fL (ref 80.0–100.0)
Platelets: 306 10*3/uL (ref 150–400)
RBC: 4.04 MIL/uL — ABNORMAL LOW (ref 4.22–5.81)
RDW: 12 % (ref 11.5–15.5)
WBC: 6.1 10*3/uL (ref 4.0–10.5)
nRBC: 0 % (ref 0.0–0.2)

## 2019-10-03 LAB — BASIC METABOLIC PANEL
Anion gap: 10 (ref 5–15)
BUN: 9 mg/dL (ref 6–20)
CO2: 25 mmol/L (ref 22–32)
Calcium: 9.1 mg/dL (ref 8.9–10.3)
Chloride: 102 mmol/L (ref 98–111)
Creatinine, Ser: 0.81 mg/dL (ref 0.61–1.24)
GFR calc Af Amer: >60 mL/min (ref >60)
GFR calc non Af Amer: >60 mL/min (ref >60)
Glucose, Bld: 123 mg/dL — ABNORMAL HIGH (ref 70–99)
Potassium: 3.8 mmol/L (ref 3.5–5.1)
Sodium: 137 mmol/L (ref 135–145)

## 2019-10-03 LAB — GLUCOSE, CAPILLARY
Glucose-Capillary: 113 mg/dL — ABNORMAL HIGH (ref 70–99)
Glucose-Capillary: 119 mg/dL — ABNORMAL HIGH (ref 70–99)
Glucose-Capillary: 170 mg/dL — ABNORMAL HIGH (ref 70–99)
Glucose-Capillary: 124 mg/dL — ABNORMAL HIGH (ref 70–99)

## 2019-10-03 LAB — CULTURE, BLOOD (ROUTINE X 2)
Culture: NO GROWTH
Culture: NO GROWTH
Special Requests: ADEQUATE
Special Requests: ADEQUATE

## 2019-10-03 NOTE — Plan of Care (Signed)

## 2019-10-03 NOTE — Progress Notes (Signed)
Patient ID: [name], male   DOB: 02/12/1986, 34 y.o.   MRN: [id number]         [facility]  Date of Admission:  09/28/2019   Total days of antibiotics 6         ASSESSMENT: He has bilateral, most likely polymicrobial diabetic foot infections.  He is very reluctant to have a PICC placement and undergo outpatient IV antibiotic therapy.  I told him that see if there is a reasonable oral regimen to try based on culture and bone biopsy results.  PLAN: 1. 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 chlorhexidine  15 mL Mouth/Throat NOW  . Chlorhexidine Gluconate Cloth  6 each Topical Q0600  . enoxaparin (LOVENOX) injection  40 mg Subcutaneous Q24H  . insulin aspart  0-5 Units Subcutaneous QHS  . insulin aspart  0-9 Units Subcutaneous TID WC  . insulin glargine  40 Units Subcutaneous QAC breakfast  . metroNIDAZOLE  500 mg Oral Q8H  . multivitamin with minerals  1 tablet Oral Daily  . mupirocin ointment  1 application Nasal BID  . Ensure Max Protein  11 oz Oral BID  . sodium chloride flush  3 mL Intravenous Once   Continuous Infusions: . sodium chloride 10 mL/hr at 10/03/19 0749  . cefTRIAXone (ROCEPHIN)  IV Stopped (10/03/19 0750)  . lactated ringers    . vancomycin 750 mg (10/03/19 0816)   PRN Meds:.sodium chloride, acetaminophen **OR** acetaminophen, albuterol, HYDROcodone-acetaminophen, ondansetron **OR** ondansetron (ZOFRAN) IV   SUBJECTIVE: He underwent incision and drainage of the right foot wounds with bone biopsies yesterday.  No organisms were seen on any of the Gram stain's.  He is  upset with me that I did not tell him that a PICC line will go although in the veins in his upper chest.  Review of Systems: Review of Systems  Constitutional: Negative for chills, diaphoresis and fever.  Musculoskeletal: Negative for joint pain.    Allergies  Allergen Reactions  . Basaglar Stephanie Coup [Insulin Glargine] Diarrhea  . Metformin And Related Diarrhea and Nausea And Vomiting  . Trulicity [Dulaglutide] Diarrhea    OBJECTIVE: Vitals:   10/02/19 2145 10/02/19 2158 10/03/19 0350 10/03/19 0748  BP: 129/84 140/85 104/64 124/78  Pulse: 76 86 75 77  Resp: 16 18 17 16   Temp: 97.8 F (36.6 C) 98 F (36.7 C) 97.8 F (36.6 C) 97.8 F (36.6 C)  TempSrc:  Oral Oral Oral  SpO2: 100% 100% 99% 99%  Weight:      Height:       Body mass index is 30.82 kg/m.  Physical Exam Constitutional:      Comments: He is resting comfortably in bed looking at his phone.  Musculoskeletal:     Comments: Ace wraps on both feet.  Psychiatric:        Mood and Affect: Mood normal.     Lab Results Lab Results  Component Value Date   WBC 6.1 10/03/2019   HGB 10.6 (L) 10/03/2019   HCT 34.4 (L) 10/03/2019   MCV 85.1 10/03/2019   PLT 306 10/03/2019    Lab Results  Component  Value Date   CREATININE 0.81 10/03/2019   BUN 9 10/03/2019   NA 137 10/03/2019   K 3.8 10/03/2019   CL 102 10/03/2019   CO2 25 10/03/2019    Lab Results  Component Value Date   ALT 13 09/28/2019   AST 12 (L) 09/28/2019   ALKPHOS 77 09/28/2019   BILITOT 0.4 09/28/2019     Microbiology: Recent Results (from the past 240 hour(s))  WOUND CULTURE     Status: Abnormal   Collection Time: 09/28/19 12:08 PM   Specimen: Foot, Right; Wound  Result Value Ref Range Status   MICRO NUMBER: [id number]  Final   SPECIMEN QUALITY: Adequate  Final   SOURCE: FOOT, RIGHT  Final   STATUS: FINAL  Final   GRAM STAIN: Gram positive cocci in chains  Final    Comment: Few White blood cells seen Many epithelial cells Moderate Gram  positive cocci in clusters Moderate Gram positive cocci in chains   ISOLATE 1: methicillin resistant Staphylococcus aureus (A)  Final    Comment: Heavy growth of Methicillin resistant Staphylococcus aureus (MRSA) Negative for inducible clindamycin resistance.      Susceptibility   Methicillin resistant staphylococcus aureus - AEROBIC CULT, GRAM STAIN POSITIVE 1    VANCOMYCIN 1 Sensitive     CIPROFLOXACIN >=8 Resistant     CLINDAMYCIN <=0.25 Sensitive     LEVOFLOXACIN 4 Resistant     ERYTHROMYCIN >=8 Resistant     GENTAMICIN <=0.5 Sensitive     OXACILLIN* >=4 Resistant      * Oxacillin-resistant staphylococci are resistant toall currently available beta-lactam antimicrobialagents including penicillins, beta lactam/beta-lactamase inhibitor combinations, and cephems withstaphylococcal indications, including Cefazolin.    TETRACYCLINE <=1 Sensitive     TRIMETH/SULFA* <=10 Sensitive      * Oxacillin-resistant staphylococci are resistant toall currently available beta-lactam antimicrobialagents including penicillins, beta lactam/beta-lactamase inhibitor combinations, and cephems withstaphylococcal indications, including Cefazolin.Legend:S = Susceptible  I = IntermediateR = Resistant  NS = Not susceptible* = Not tested  NR = Not reported**NN = See antimicrobic comments  SARS Coronavirus 2 by RT PCR (hospital order, performed in [facility] hospital lab) Nasopharyngeal Nasopharyngeal Swab     Status: None   Collection Time: 09/28/19  3:14 PM   Specimen: Nasopharyngeal Swab  Result Value Ref Range Status   SARS Coronavirus 2 NEGATIVE NEGATIVE Final    Comment: (NOTE) SARS-CoV-2 target nucleic acids are NOT DETECTED.  The SARS-CoV-2 RNA is generally detectable in upper and lower respiratory specimens during the acute phase of infection. The lowest concentration of SARS-CoV-2 viral copies this assay can detect is 250 copies / mL. A negative result does not preclude SARS-CoV-2 infection and should  not be used as the sole basis for treatment or other patient management decisions.  A negative result may occur with improper specimen collection / handling, submission of specimen other than nasopharyngeal swab, presence of viral mutation(s) within the areas targeted by this assay, and inadequate number of viral copies (<250 copies / mL). A negative result must be combined with clinical observations, patient history, and epidemiological information.  Fact Sheet for Patients:   BoilerBrush.com.cy  Fact Sheet for Healthcare Providers: https://pope.com/  This test is not yet approved or  cleared by the [place] FDA and has been authorized for detection and/or diagnosis of SARS-CoV-2 by FDA under an Emergency Use Authorization (EUA).  This EUA will remain in effect (meaning this test can be used) for the duration of the COVID-19 declaration under Section 564(b)(1)  of the Act, 21 U.S.C. section 360bbb-3(b)(1), unless the authorization is terminated or revoked sooner.  Performed at [facility], [address] [place], [place] [address]   Blood Cultures x 2 sites     Status: None   Collection Time: 09/28/19  7:16 PM   Specimen: BLOOD  Result Value Ref Range Status   Specimen Description BLOOD RIGHT ANTECUBITAL  Final   Special Requests   Final    BOTTLES DRAWN AEROBIC AND ANAEROBIC Blood Culture adequate volume   Culture   Final    NO GROWTH 5 DAYS Performed at [facility], [address] [place], [place] [address]    Report Status 10/03/2019 FINAL  Final  Blood Cultures x 2 sites     Status: None   Collection Time: 09/28/19  7:24 PM   Specimen: BLOOD RIGHT FOREARM  Result Value Ref Range Status   Specimen Description BLOOD RIGHT FOREARM  Final   Special Requests   Final    BOTTLES DRAWN AEROBIC AND ANAEROBIC Blood Culture adequate volume   Culture   Final    NO GROWTH 5 DAYS Performed at [facility], [address] [place], [place] [address]    Report Status 10/03/2019 FINAL  Final  Surgical pcr screen     Status: Abnormal   Collection Time: 10/02/19 12:48 AM   Specimen: Nasal Mucosa; Nasal Swab  Result Value Ref Range Status   MRSA, PCR POSITIVE (A) NEGATIVE Final    Comment: RESULT CALLED TO, READ BACK BY AND VERIFIED WITH: RHINEHART,D RN AT 0329 10/02/2019 MITCHELL,L    Staphylococcus aureus POSITIVE (A) NEGATIVE Final    Comment: (NOTE) The Xpert SA Assay (FDA approved for NASAL specimens in patients 33 years of age and older), is one component of a comprehensive surveillance program. It is not intended to diagnose infection nor to guide or monitor treatment. Performed at [facility], [address] [place], [place] [address]   Aerobic/Anaerobic Culture (surgical/deep wound)     Status: None (Preliminary result)   Collection Time: 10/02/19  7:46 PM   Specimen: PATH Bone biopsy; Tissue  Result Value Ref Range Status   Specimen Description BONE  Final   Special Requests LEFT METATARSAL SAMPLE A  Final   Gram Stain   Final    NO WBC SEEN NO ORGANISMS SEEN Performed at [facility], [address] [place], [place] [address]    Culture PENDING  Incomplete   Report Status PENDING  Incomplete  Aerobic/Anaerobic Culture (surgical/deep wound)     Status: None (Preliminary result)   Collection Time: 10/02/19  7:57 PM   Specimen: PATH Bone biopsy; Tissue  Result Value Ref Range Status   Specimen Description BONE  Final   Special Requests RIGHT 4 METATARSAL SAMPLE B  Final   Gram Stain   Final    NO WBC SEEN NO ORGANISMS SEEN Performed at [facility], [address] [place], [place] [address]    Culture PENDING  Incomplete   Report Status PENDING  Incomplete  Aerobic/Anaerobic Culture (surgical/deep wound)     Status: None (Preliminary result)   Collection Time: 10/02/19  7:57 PM   Specimen: PATH Bone biopsy; Tissue  Result Value Ref Range Status    Specimen Description BONE  Final   Special Requests RIGHT 5 METATARSAL SAMPLE C  Final   Gram Stain   Final    NO WBC SEEN NO ORGANISMS SEEN  Performed at [name], [address] [place], [place] [address]    Culture PENDING  Incomplete   Report Status PENDING  Incomplete    Cliffton Asters, MD [facility] [phone] pager   [phone] cell 10/03/2019, 12:07 PM

## 2019-10-03 NOTE — Progress Notes (Signed)
PROGRESS NOTE        PATIENT DETAILS Name: [name] Age: 34 y.o. Sex: male Date of Birth: 03/21/1986 Admit Date: 09/28/2019 Admitting Physician Clydie Braun, MD [id number], Windy Fast, MD  Brief Narrative: Patient is a 35 y.o. 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  See below for further details.  Significant events: 6/26>> admit to [facility] for diabetic foot infection.  Significant studies: 6/27: MRI right foot>> T2 hyperintensity within the fourth/fifth metatarsal heads-nonspecific but could be secondary to osteomyelitis, hyperemia, early bone marrow edema 6/27: MRI left foot>> small peripheral enhancing fluid collections along the plantar/lateral aspect of fifth metatarsal base-suspicious for abscess 6/26: Lower extremity Doppler>> no DVT  Antimicrobial therapy: Rocephin: 6/26>> Flagyl: 6/26>> Vancomycin: 6/26>>  Microbiology data: 6/26: Blood cultures>> no growth  6/26: Wound culture>> MRSA  Procedures : 6/30>> irrigation and debridement of bilateral lower extremity wounds.  Consults: ID Podiatry  DVT Prophylaxis : enoxaparin (LOVENOX) injection 40 mg Start: 09/28/19 1800  Subjective: No SOB of Chest pain.  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  Patient is not keen on placing PICC line and going home on IV antibiotics.    DM-1 with uncontrolled hyperglycemia (A1c 11.7 on 6/26): CBG stable overnight-continue Lantus 40 units and SSI.  Follow and optimize.  CBG (last 3)  Recent Labs    10/02/19 2200 10/03/19 0744 10/03/19 1142  GLUCAP 113* 113* 119*   Normocytic anemia: Related to acute  illness-appears mild-follow.  Nutrition Problem: Nutrition Problem: Increased nutrient needs Etiology: wound healing Signs/Symptoms: estimated needs Interventions: MVI, Magic cup, Premier Protein  Obesity: Estimated body mass index is 30.82 kg/m as calculated from the following:   Height as of this encounter: 6\' 5"  (1.956 m).   Weight as of this encounter: 117.9 kg.   Diet: Diet Order            Diet Carb Modified Fluid consistency: Thin; Room service appropriate? Yes  Diet effective now                  Code Status: Full code   Family Communication: None at bedside  Disposition Plan: Status is: Inpatient  Remains inpatient appropriate because:Inpatient level of care appropriate due to severity of illness  Dispo:  Patient From: Home   Planned Disposition: Home  Expected discharge date: 10/02/19  Medically stable for discharge: No  Barriers to Discharge: Bilateral diabetic foot infections with possible right foot osteomyelitis-on IV antibiotics-s/p incision and drainage   Antimicrobial agents: Anti-infectives (From admission, onward)   Start     Dose/Rate Route Frequency Ordered Stop   10/02/19 0100  vancomycin (VANCOREADY) IVPB 750 mg/150 mL     Discontinue     750 mg 150 mL/hr over 60 Minutes Intravenous Every 8 hours 10/01/19 2038     09/30/19 0100  vancomycin (VANCOCIN) IVPB 1000 mg/200 mL premix  Status:  Discontinued        1,000 mg 200 mL/hr over 60 Minutes Intravenous Every 8 hours 09/29/19 1656 10/01/19 2038   09/29/19 1930  vancomycin (VANCOCIN) IVPB 1000 mg/200 mL premix  Status:  Discontinued       "Followed  by" Linked Group Details   1,000 mg 200 mL/hr over 60 Minutes Intravenous Every 8 hours 09/29/19 1029 09/29/19 1656   09/29/19 1700  vancomycin (VANCOCIN) 2,500 mg in sodium chloride 0.9 % 500 mL IVPB        2,500 mg 250 mL/hr over 120 Minutes Intravenous  Once 09/29/19 1656 09/29/19 1944   09/29/19 1130  vancomycin (VANCOCIN) 2,500 mg in  sodium chloride 0.9 % 500 mL IVPB  Status:  Discontinued       "Followed by" Linked Group Details   2,500 mg 250 mL/hr over 120 Minutes Intravenous  Once 09/29/19 1029 09/29/19 1656   09/28/19 1700  cefTRIAXone (ROCEPHIN) 2 g in sodium chloride 0.9 % 100 mL IVPB     Discontinue    "And" Linked Group Details   2 g 200 mL/hr over 30 Minutes Intravenous Every 24 hours 09/28/19 1606     09/28/19 1700  metroNIDAZOLE (FLAGYL) tablet 500 mg     Discontinue    "And" Linked Group Details   500 mg Oral Every 8 hours 09/28/19 1606         Time spent: 25- minutes-Greater than 50% of this time was spent in counseling, explanation of diagnosis, planning of further management, and coordination of care.  MEDICATIONS: Scheduled Meds: . chlorhexidine  15 mL Mouth/Throat NOW  . Chlorhexidine Gluconate Cloth  6 each Topical Q0600  . enoxaparin (LOVENOX) injection  40 mg Subcutaneous Q24H  . insulin aspart  0-5 Units Subcutaneous QHS  . insulin aspart  0-9 Units Subcutaneous TID WC  . insulin glargine  40 Units Subcutaneous QAC breakfast  . metroNIDAZOLE  500 mg Oral Q8H  . multivitamin with minerals  1 tablet Oral Daily  . mupirocin ointment  1 application Nasal BID  . Ensure Max Protein  11 oz Oral BID  . sodium chloride flush  3 mL Intravenous Once   Continuous Infusions: . sodium chloride 10 mL/hr at 10/03/19 0749  . cefTRIAXone (ROCEPHIN)  IV Stopped (10/03/19 0750)  . lactated ringers    . vancomycin Stopped (10/03/19 0916)   PRN Meds:.sodium chloride, acetaminophen **OR** acetaminophen, albuterol, HYDROcodone-acetaminophen, ondansetron **OR** ondansetron (ZOFRAN) IV   PHYSICAL EXAM: Vital signs: Vitals:   10/02/19 2145 10/02/19 2158 10/03/19 0350 10/03/19 0748  BP: 129/84 140/85 104/64 124/78  Pulse: 76 86 75 77  Resp: Temp: 97.8 F (36.6 C) 98 F (36.7 C) 97.8 F (36.6 C) 97.8 F (36.6 C)  TempSrc:  Oral Oral Oral  SpO2: 100% 100% 99% 99%  Weight:      Height:        Filed Weights   09/28/19 1135 10/02/19 1822  Weight: 117.9 kg 117.9 kg   Body mass index is 30.82 kg/m.   Gen Exam:Alert awake-not in any distress HEENT:atraumatic, normocephalic Chest: B/L clear to auscultation anteriorly CVS:S1S2 regular Abdomen:soft non tender, non distended Extremities:no edema Neurology: Non focal Skin: no rash  I have personally reviewed following labs and imaging studies  LABORATORY DATA: CBC: Recent Labs  Lab 09/28/19 1151 09/29/19 0506 09/30/19 0430 10/02/19 0403 10/03/19 0352  WBC 6.5 5.4 5.4 6.2 6.1  NEUTROABS 3.8  --   --   --   --   HGB 10.8* 10.1* 10.0* 10.7* 10.6*  HCT 34.9* 32.4* 31.8* 33.3* 34.4*  MCV 86.6 85.7 85.3 84.7 85.1  PLT 328 285 304 374 306    Basic Metabolic Panel: Recent Labs  Lab 09/28/19 1151 09/29/19 0506 09/30/19 0430  10/02/19 0403 10/03/19 0352  NA 136 137 137 137 137  K 4.6 4.3 4.6 3.8 3.8  CL 98 98 100 102 102  CO2 28 31 30 26 25   GLUCOSE 215* 214* 299* 175* 123*  BUN 10 11 9 10 9   CREATININE 1.02 0.93 0.93 0.96 0.81  CALCIUM 9.8 9.2 9.2 9.2 9.1    GFR: Estimated Creatinine Clearance: 184.6 mL/min (by C-G formula based on SCr of 0.81 mg/dL).  Liver Function Tests: Recent Labs  Lab 09/28/19 1151  AST 12*  ALT 13  ALKPHOS 77  BILITOT 0.4  PROT 8.0  ALBUMIN 2.8*   No results for input(s): LIPASE, AMYLASE in the last 168 hours. No results for input(s): AMMONIA in the last 168 hours.  Coagulation Profile: Recent Labs  Lab 09/28/19 1420  INR 1.1    Cardiac Enzymes: No results for input(s): CKTOTAL, CKMB, CKMBINDEX, TROPONINI in the last 168 hours.  BNP (last 3 results) No results for input(s): PROBNP in the last 8760 hours.  Lipid Profile: No results for input(s): CHOL, HDL, LDLCALC, TRIG, CHOLHDL, LDLDIRECT in the last 72 hours.  Thyroid Function Tests: No results for input(s): TSH, T4TOTAL, FREET4, T3FREE, THYROIDAB in the last 72 hours.  Anemia Panel: No results for  input(s): VITAMINB12, FOLATE, FERRITIN, TIBC, IRON, RETICCTPCT in the last 72 hours.  Urine analysis:    Component Value Date/Time   COLORURINE YELLOW 08/20/2019 0455   APPEARANCEUR CLEAR 08/20/2019 0455   LABSPEC 1.015 08/20/2019 0455   PHURINE 7.0 08/20/2019 0455   GLUCOSEU 50 (A) 08/20/2019 0455   HGBUR NEGATIVE 08/20/2019 0455   BILIRUBINUR NEGATIVE 08/20/2019 0455   KETONESUR NEGATIVE 08/20/2019 0455   PROTEINUR NEGATIVE 08/20/2019 0455   UROBILINOGEN 0.2 11/07/2011 2241   NITRITE NEGATIVE 08/20/2019 0455   LEUKOCYTESUR NEGATIVE 08/20/2019 0455    Sepsis Labs: Lactic Acid, Venous    Component Value Date/Time   LATICACIDVEN 0.8 09/28/2019 1151    MICROBIOLOGY: Recent Results (from the past 240 hour(s))  WOUND CULTURE     Status: Abnormal   Collection Time: 09/28/19 12:08 PM   Specimen: Foot, Right; Wound  Result Value Ref Range Status   MICRO NUMBER: 09/30/2019  Final   SPECIMEN QUALITY: Adequate  Final   SOURCE: FOOT, RIGHT  Final   STATUS: FINAL  Final   GRAM STAIN: Gram positive cocci in chains  Final    Comment: Few White blood cells seen Many epithelial cells Moderate Gram positive cocci in clusters Moderate Gram positive cocci in chains   ISOLATE 1: methicillin resistant Staphylococcus aureus (A)  Final    Comment: Heavy growth of Methicillin resistant Staphylococcus aureus (MRSA) Negative for inducible clindamycin resistance.      Susceptibility   Methicillin resistant staphylococcus aureus - AEROBIC CULT, GRAM STAIN POSITIVE 1    VANCOMYCIN 1 Sensitive     CIPROFLOXACIN >=8 Resistant     CLINDAMYCIN <=0.25 Sensitive     LEVOFLOXACIN 4 Resistant     ERYTHROMYCIN >=8 Resistant     GENTAMICIN <=0.5 Sensitive     OXACILLIN* >=4 Resistant      * Oxacillin-resistant staphylococci are resistant toall currently available beta-lactam antimicrobialagents including penicillins, beta lactam/beta-lactamase inhibitor combinations, and cephems withstaphylococcal  indications, including Cefazolin.    TETRACYCLINE <=1 Sensitive     TRIMETH/SULFA* <=10 Sensitive      * Oxacillin-resistant staphylococci are resistant toall currently available beta-lactam antimicrobialagents including penicillins, beta lactam/beta-lactamase inhibitor combinations, and cephems withstaphylococcal indications, including Cefazolin.Legend:S = Susceptible  I =  IntermediateR = Resistant  NS = Not susceptible* = Not tested  NR = Not reported**NN = See antimicrobic comments  SARS Coronavirus 2 by RT PCR (hospital order, performed in [facility] hospital lab) Nasopharyngeal Nasopharyngeal Swab     Status: None   Collection Time: 09/28/19  3:14 PM   Specimen: Nasopharyngeal Swab  Result Value Ref Range Status   SARS Coronavirus 2 NEGATIVE NEGATIVE Final    Comment: (NOTE) SARS-CoV-2 target nucleic acids are NOT DETECTED.  The SARS-CoV-2 RNA is generally detectable in upper and lower respiratory specimens during the acute phase of infection. The lowest concentration of SARS-CoV-2 viral copies this assay can detect is 250 copies / mL. A negative result does not preclude SARS-CoV-2 infection and should not be used as the sole basis for treatment or other patient management decisions.  A negative result may occur with improper specimen collection / handling, submission of specimen other than nasopharyngeal swab, presence of viral mutation(s) within the areas targeted by this assay, and inadequate number of viral copies (<250 copies / mL). A negative result must be combined with clinical observations, patient history, and epidemiological information.  Fact Sheet for Patients:   BoilerBrush.com.cyhttps://www.fda.gov/media/136312/download  Fact Sheet for Healthcare Providers: https://pope.com/https://www.fda.gov/media/136313/download  This test is not yet approved or  cleared by the [place] FDA and has been authorized for detection and/or diagnosis of SARS-CoV-2 by FDA under an Emergency Use Authorization  (EUA).  This EUA will remain in effect (meaning this test can be used) for the duration of the COVID-19 declaration under Section 564(b)(1) of the Act, 21 U.S.C. section 360bbb-3(b)(1), unless the authorization is terminated or revoked sooner.  Performed at [facility], [address] [place], [place] [address]   Blood Cultures x 2 sites     Status: None   Collection Time: 09/28/19  7:16 PM   Specimen: BLOOD  Result Value Ref Range Status   Specimen Description BLOOD RIGHT ANTECUBITAL  Final   Special Requests   Final    BOTTLES DRAWN AEROBIC AND ANAEROBIC Blood Culture adequate volume   Culture   Final    NO GROWTH 5 DAYS Performed at [facility], [address] [place], [place] [address]    Report Status 10/03/2019 FINAL  Final  Blood Cultures x 2 sites     Status: None   Collection Time: 09/28/19  7:24 PM   Specimen: BLOOD RIGHT FOREARM  Result Value Ref Range Status   Specimen Description BLOOD RIGHT FOREARM  Final   Special Requests   Final    BOTTLES DRAWN AEROBIC AND ANAEROBIC Blood Culture adequate volume   Culture   Final    NO GROWTH 5 DAYS Performed at [facility], [address] [place], [place] [address]    Report Status 10/03/2019 FINAL  Final  Surgical pcr screen     Status: Abnormal   Collection Time: 10/02/19 12:48 AM   Specimen: Nasal Mucosa; Nasal Swab  Result Value Ref Range Status   MRSA, PCR POSITIVE (A) NEGATIVE Final    Comment: RESULT CALLED TO, READ BACK BY AND VERIFIED WITH: RHINEHART,D RN AT 0329 10/02/2019 MITCHELL,L    Staphylococcus aureus POSITIVE (A) NEGATIVE Final    Comment: (NOTE) The Xpert SA Assay (FDA approved for NASAL specimens in patients 33 years of age and older), is one component of a comprehensive surveillance program. It is not intended to diagnose infection nor to guide or monitor treatment. Performed at [facility], [address], [place], [place] [address]   Aerobic/Anaerobic Culture  (surgical/deep wound)     Status: None (Preliminary result)   Collection Time: 10/02/19  7:46 PM   Specimen: PATH Bone biopsy; Tissue  Result Value Ref Range Status   Specimen Description BONE  Final   Special Requests LEFT METATARSAL SAMPLE A  Final   Gram Stain   Final    NO WBC SEEN NO ORGANISMS SEEN Performed at [facility], [address], [place], [place] [address]    Culture PENDING  Incomplete   Report Status PENDING  Incomplete  Aerobic/Anaerobic Culture (surgical/deep wound)     Status: None (Preliminary result)   Collection Time: 10/02/19  7:57 PM   Specimen: PATH Bone biopsy; Tissue  Result Value Ref Range Status   Specimen Description BONE  Final   Special Requests RIGHT 4 METATARSAL SAMPLE B  Final   Gram Stain   Final    NO WBC SEEN NO ORGANISMS SEEN Performed at [facility], [address], [place], [place] [address]    Culture PENDING  Incomplete   Report Status PENDING  Incomplete  Aerobic/Anaerobic Culture (surgical/deep wound)     Status: None (Preliminary result)   Collection Time: 10/02/19  7:57 PM   Specimen: PATH Bone biopsy; Tissue  Result Value Ref Range Status   Specimen Description BONE  Final   Special Requests RIGHT 5 METATARSAL SAMPLE C  Final   Gram Stain   Final    NO WBC SEEN NO ORGANISMS SEEN Performed at [facility], [address], [place], [place] [address]    Culture PENDING  Incomplete   Report Status PENDING  Incomplete    RADIOLOGY STUDIES/RESULTS: DG Foot 2 Views Left  Result Date: 10/02/2019 CLINICAL DATA:  Postop debridement of wounds with bone biopsy. EXAM: LEFT FOOT - 2 VIEW COMPARISON:  Radiograph 09/28/2019 FINDINGS: Remote fifth ray transmetatarsal amputation with mild heterotopic ossification at the operative site. Linear lucency through the base of the fifth metatarsal is likely site of bone biopsy. No frank bony destruction. Minimal skin irregularity lateral to the fifth proximal metatarsal. No other  interval change. Mild hammertoe deformity of the digits. IMPRESSION: Prior fifth ray transmetatarsal amputation. Linear lucency through the base of the fifth metatarsal likely site of bone biopsy. Electronically Signed   By: Narda Rutherford M.D.   On: 10/02/2019 21:48   DG Foot 2 Views Right  Result Date: 10/02/2019 CLINICAL DATA:  Postop debridement of wounds with bone biopsy. EXAM: RIGHT FOOT - 2 VIEW COMPARISON:  Radiograph 09/28/2019. MRI 09/09/2019 FINDINGS: Skin and soft tissue defect noted about the lateral aspect of the forefoot. Linear lucencies through the distal fourth and fifth metatarsals may represent site of bone biopsies. There is no evidence of frank bony destruction. No periosteal reaction. Unchanged alignment. IMPRESSION: Soft tissue and skin defect about the lateral forefoot. Linear lucencies through the distal fourth and fifth metatarsals may represent site of bone biopsies. Electronically Signed   By: Narda Rutherford M.D.   On: 10/02/2019 21:46   [name] EKG SITE RITE  Result Date: 10/02/2019 If Site Rite image not attached, placement could not be confirmed due to current cardiac rhythm.    LOS: 5 days   Jeoffrey Massed, MD  [facility]    To contact the attending provider between 7A-7P or the covering provider during after hours 7P-7A, please log into the web site [email or web address] and access using universal [place]  password for that web site. If you do not have the password, please call the hospital operator.  10/03/2019, 3:29 PM

## 2019-10-03 NOTE — Progress Notes (Signed)
PICC RN followed up with patient RN regarding PICC placement. Per patient RN, patient still undecided about PICC placement.  Patient nurse will amend PICC order when patient is ready for PICC placement.

## 2019-10-04 DIAGNOSIS — Z794 Long term (current) use of insulin: Secondary | ICD-10-CM

## 2019-10-04 LAB — GLUCOSE, CAPILLARY
Glucose-Capillary: 181 mg/dL — ABNORMAL HIGH (ref 70–99)
Glucose-Capillary: 214 mg/dL — ABNORMAL HIGH (ref 70–99)
Glucose-Capillary: 241 mg/dL — ABNORMAL HIGH (ref 70–99)

## 2019-10-04 LAB — VANCOMYCIN, TROUGH: Vancomycin Tr: 16 ug/mL (ref 15–20)

## 2019-10-04 MED ORDER — LINEZOLID 600 MG PO TABS: 600.0000 mg | ORAL_TABLET | Freq: Two times a day (BID) | ORAL | 0 refills | Status: AC

## 2019-10-04 MED ORDER — ENSURE MAX PROTEIN PO LIQD: 11.0000 [oz_av] | Freq: Two times a day (BID) | ORAL | 0 refills | Status: AC

## 2019-10-04 MED ORDER — LEVOFLOXACIN 750 MG PO TABS: 750.0000 mg | ORAL_TABLET | Freq: Every day | ORAL | 0 refills | Status: AC

## 2019-10-04 MED ORDER — METRONIDAZOLE 500 MG PO TABS: 500.0000 mg | ORAL_TABLET | Freq: Three times a day (TID) | ORAL | 0 refills | Status: AC

## 2019-10-04 MED FILL — levoFLOXacin 750 MG TABS: 750 | 28 days supply | Qty: 28 | Fill #0

## 2019-10-04 MED FILL — LINEZOLID 600 MG TABS: 600 | 28 days supply | Qty: 56 | Fill #0

## 2019-10-04 MED FILL — metroNIDAZOLE 500 MG TABS: 500 | 28 days supply | Qty: 84 | Fill #0

## 2019-10-04 NOTE — Progress Notes (Signed)
Subjective: POD #1 s/p bilateral bone biopsies, wound debridements.  He states he is doing well not having any pain.  He has no concerns today.  Denies any fevers, chills, nausea, vomiting.  Objective: AAO x3, NAD DP/PT pulses palpable bilaterally, CRT less than 3 seconds Swelling to the right leg is much improved.  Small incisions with sutures intact in the bone biopsies.  Wounds with granular wound base without any surrounding erythema, ascending cellulitis there is no fluctuation crepitation there is no malodor.  No significant warmth to the foot. No pain with calf compression, swelling, warmth, erythema  Assessment: Postop day #1 status post bone biopsy, wound debridements  Plan: Dressing was changed today.  I applied Xeroform followed by dry sterile dressing to the wounds.  I did switch to Aquacel to the wounds.  For now continue IV antibiotics and awaiting bone biopsy results.  Encourage elevation.  Weightbearing as tolerated in surgical shoes although discussed with him to significantly limit his weightbearing in order for the wounds to heal. Discussed the case with Dr. Samuella Cota who will continue to follow the patient for now.  Ovid Curd, DPM O: [phone] C: [phone]

## 2019-10-04 NOTE — Plan of Care (Signed)
  Problem: Education: Goal: Knowledge of General Education information will improve Description: Including pain rating scale, medication(s)/side effects and non-pharmacologic comfort measures 10/04/2019 1940 by Loma Sousa, RN Outcome: Adequate for Discharge 10/04/2019 0831 by Loma Sousa, RN Outcome: Progressing   Problem: Health Behavior/Discharge Planning: Goal: Ability to manage health-related needs will improve 10/04/2019 1940 by Loma Sousa, RN Outcome: Adequate for Discharge 10/04/2019 0831 by Loma Sousa, RN Outcome: Progressing   Problem: Clinical Measurements: Goal: Ability to maintain clinical measurements within normal limits will improve Outcome: Adequate for Discharge Goal: Will remain free from infection Outcome: Adequate for Discharge Goal: Diagnostic test results will improve Outcome: Adequate for Discharge Goal: Respiratory complications will improve Outcome: Adequate for Discharge Goal: Cardiovascular complication will be avoided Outcome: Adequate for Discharge   Problem: Activity: Goal: Risk for activity intolerance will decrease Outcome: Adequate for Discharge   Problem: Nutrition: Goal: Adequate nutrition will be maintained Outcome: Adequate for Discharge   Problem: Coping: Goal: Level of anxiety will decrease Outcome: Adequate for Discharge   Problem: Elimination: Goal: Will not experience complications related to bowel motility Outcome: Adequate for Discharge Goal: Will not experience complications related to urinary retention Outcome: Adequate for Discharge   Problem: Pain Managment: Goal: General experience of comfort will improve 10/04/2019 1940 by Loma Sousa, RN Outcome: Adequate for Discharge 10/04/2019 0831 by Loma Sousa, RN Outcome: Progressing   Problem: Safety: Goal: Ability to remain free from injury will improve 10/04/2019 1940 by Loma Sousa, RN Outcome: Adequate for Discharge 10/04/2019  0831 by Loma Sousa, RN Outcome: Progressing   Problem: Skin Integrity: Goal: Risk for impaired skin integrity will decrease Outcome: Adequate for Discharge

## 2019-10-04 NOTE — Discharge Instructions (Signed)
Linezolid tablets What is this medicine? LINEZOLID (li NE zoh lid) is an oxazolidinone antibiotic. It is used to treat certain kinds of bacterial infections. It will not work for colds, flu, or other viral infections.  This medicine may be used for other purposes; ask your health care provider or pharmacist if you have questions. COMMON BRAND NAME(S): Zyvox  What should I tell my health care provider before I take this medicine? They need to know if you have any of these conditions:  cancer  diabetes  heart disease  high blood pressure  kidney disease  pheochromocytoma  untreated thyroid disease  an unusual or allergic reaction to linezolid, other antibiotics or medicines, foods, dyes, or preservatives  pregnant or trying to get pregnant  breast-feeding  How should I use this medicine? Take this medicine by mouth with a glass of water. Follow the directions on the prescription label. Take with food or on an empty stomach. Take your medicine at regular intervals. Do not take your medicine more often than directed. Take all of your medicine as directed even if you think your are better. Do not skip doses or stop your medicine early. Talk to your pediatrician regarding the use of this medicine in children. While this drug may be prescribed for selected conditions, precautions do apply. Overdosage: If you think you have taken too much of this medicine contact a poison control center or emergency room at once. NOTE: This medicine is only for you. Do not share this medicine with others.  What if I miss a dose? If you miss a dose, take it as soon as you can. If it is almost time for your next dose, take only that dose. Do not take double or extra doses.  What may interact with this medicine? Do not take this medicine with any of the following medications:  bupropion  certain medicines for depression, anxiety, or psychotic  disturbances  doxepin  fluoxetine  furazolidone  green tea  MAOIs like Carbex, Eldepryl, Marplan, Nardil, and Parnate  milnacipran  procarbazine  rasagiline  selegiline  St. John's wort  Tryptophan  This medicine may also interact with the following medications:  birth control pills  medicines for allergies or colds like phenylpropanolamine, pseudoephedrine  medicines for blood pressure  stimulant medicines for attention disorders, weight loss, or to stay awake  This list may not describe all possible interactions. Give your health care provider a list of all the medicines, herbs, non-prescription drugs, or dietary supplements you use. Also tell them if you smoke, drink alcohol, or use illegal drugs. Some items may interact with your medicine.  What should I watch for while using this medicine? Tell your doctor or health care professional if your symptoms do not begin to improve or if you get new symptoms. You will need to be on a special diet while taking this medicine. Ask your doctor or health care professional for a list of foods that you should try to avoid. This includes, but is not limited to, smoked or processed meats, aged cheeses, soy sauce, red wines and beer. Do not treat diarrhea with over-the-counter products. Contact your doctor if you have diarrhea that lasts more than 2 days or if the diarrhea is severe and watery.  What side effects may I notice from receiving this medicine? Side effects that you should report to your doctor or health care professional as soon as possible:  allergic reactions like skin rash, itching or hives, swelling of the face, lips, or  tongue  breathing problems  burning, numbness, or tingling  changes in vision  confused, restless  discolored, sore mouth  fever  irregular heart beat, blood pressure  seizures  tremor, trouble walking  unusual bleeding or bruising  unusually weak or tired  Side effects that  usually do not require medical attention (report to your doctor or health care professional if they continue or are bothersome):  changes in taste  constipation or diarrhea  dizzy  headache  nausea, vomiting  stomach upset  trouble sleeping  vaginal itch, irritation  This list may not describe all possible side effects. Call your doctor for medical advice about side effects. You may report side effects to FDA at 1-800-FDA-1088.  Where should I keep my medicine? Keep out of the reach of children.  Store at room temperature between 15 and 30 degrees C (59 and 86 degrees F). Keep container tightly closed to protect from light and moisture. Throw away any unused medicine after the expiration date.  NOTE: This sheet is a summary. It may not cover all possible information. If you have questions about this medicine, talk to your doctor, pharmacist, or health care provider.  2020 [employer]/Gold Standard (2012-10-26 14:02:20) ===========================================================  Levofloxacin tablets What is this medicine? LEVOFLOXACIN (lee voe FLOX a sin) is a quinolone antibiotic. It is used to treat certain kinds of bacterial infections. It will not work for colds, flu, or other viral infections.  This medicine may be used for other purposes; ask your health care provider or pharmacist if you have questions. COMMON BRAND NAME(S): Levaquin, Levaquin Leva-Pak  What should I tell my health care provider before I take this medicine? They need to know if you have any of these conditions:  bone problems  diabetes  heart disease  high blood pressure  history of irregular heartbeat  history of low levels of potassium in the blood  joint problems  kidney disease  liver disease  mental illness  myasthenia gravis  seizures  tendon problems  tingling of the fingers or toes, or other nerve disorder  an unusual or allergic reaction to levofloxacin, other  quinolone antibiotics, foods, dyes, or preservatives  pregnant or trying to get pregnant  breast-feeding  How should I use this medicine? Take this medicine by mouth with a full glass of water. Follow the directions on the prescription label. You can take it with or without food. If it upsets your stomach, take it with food. Take your medicine at regular intervals. Do not take your medicine more often than directed. Take all of your medicine as directed even if you think you are better. Do not skip doses or stop your medicine early. Avoid antacids, calcium, iron, and zinc products for 2 hours before and 2 hours after taking a dose of this medicine. A special MedGuide will be given to you by the pharmacist with each prescription and refill. Be sure to read this information carefully each time. Talk to your pediatrician regarding the use of this medicine in children. While this drug may be prescribed for children as young as 6 months for selected conditions, precautions do apply. Overdosage: If you think you have taken too much of this medicine contact a poison control center or emergency room at once. NOTE: This medicine is only for you. Do not share this medicine with others.  What if I miss a dose? If you miss a dose, take it as soon as you remember. If it is almost time for your next  dose, take only that dose. Do not take double or extra doses.  What may interact with this medicine? Do not take this medicine with any of the following medications:  cisapride  dronedarone  pimozide  thioridazine This medicine may also interact with the following medications:  antacids  birth control pills  certain medicines for diabetes, like glipizide, glyburide, or insulin  certain medicines that treat or prevent blood clots like warfarin  didanosine buffered tablets or powder  multivitamins  NSAIDS, medicines for pain and inflammation, like ibuprofen or naproxen  other medicines that  prolong the QT interval (cause an abnormal heart rhythm) like dofetilide, ziprasidone  steroid medicines like prednisone or cortisone  sucralfate  theophylline This list may not describe all possible interactions. Give your health care provider a list of all the medicines, herbs, non-prescription drugs, or dietary supplements you use. Also tell them if you smoke, drink alcohol, or use illegal drugs. Some items may interact with your medicine.  What should I watch for while using this medicine? Tell your doctor or healthcare professional if your symptoms do not start to get better or if they get worse. Do not treat diarrhea with over the counter products. Contact your doctor if you have diarrhea that lasts more than 2 days or if it is severe and watery. Check with your doctor or health care professional if you get an attack of severe diarrhea, nausea and vomiting, or if you sweat a lot. The loss of too much body fluid can make it dangerous for you to take this medicine.  This medicine may increase blood sugar. Ask your healthcare provider if changes in diet or medicines are needed if you have diabetes. You may get drowsy or dizzy. Do not drive, use machinery, or do anything that needs mental alertness until you know how this medicine affects you. Do not sit or stand up quickly, especially if you are an older patient. This reduces the risk of dizzy or fainting spells. This medicine can make you more sensitive to the sun. Keep out of the sun. If you cannot avoid being in the sun, wear protective clothing and use sunscreen. Do not use sun lamps or tanning beds/booths.  What side effects may I notice from receiving this medicine? Side effects that you should report to your doctor or health care professional as soon as possible:  allergic reactions like skin rash or hives, swelling of the face, lips, or tongue  anxious  bloody or watery diarrhea  breathing problems  confusion  depressed  mood  fast, irregular heartbeat  fever  hallucination, loss of contact with reality  joint, muscle, or tendon pain or swelling  loss of memory  muscle weakness  pain, tingling, numbness in the hands or feet  seizures  signs and symptoms of aortic dissection such as sudden chest, stomach, or back pain  signs and symptoms of high blood sugar such as being more thirsty or hungry or having to urinate more than normal. You may also feel very tired or have blurry vision.  signs and symptoms of liver injury like dark yellow or brown urine; general ill feeling or flu-like symptoms; light-colored stools; loss of appetite; nausea; right upper belly pain; unusually weak or tired; yellowing of the eyes or skin  signs and symptoms of low blood sugar such as feeling anxious; confusion; dizziness; increased hunger; unusually weak or tired; sweating; shakiness; cold; irritable; headache; blurred vision; fast heartbeat; loss of consciousness; pale skin  suicidal thoughts or other  mood changes  sunburn  unusually weak  Side effects that usually do not require medical attention (report to your doctor or health care professional if they continue or are bothersome):  constipation  dry mouth  headache  nausea, vomiting  trouble sleeping  This list may not describe all possible side effects. Call your doctor for medical advice about side effects. You may report side effects to FDA at 1-800-FDA-1088.  Where should I keep my medicine? Keep out of the reach of children. Store at room temperature between 15 and 30 degrees C (59 and 86 degrees F). Keep in a tightly closed container. Throw away any unused medicine after the expiration date. NOTE: This sheet is a summary. It may not cover all possible information. If you have questions about this medicine, talk to your doctor, pharmacist, or health care provider.  2020 [employer]/Gold Standard (2018-03-12 14:03:14)

## 2019-10-04 NOTE — Discharge Summary (Signed)
PATIENT DETAILS Name: [name] Age: 34 y.o. Sex: male Date of Birth: 1985/06/05 MRN: [id number]. Admitting Physician: Norval Morton, MD [id number], [name], MD  Admit Date: 09/28/2019 Discharge date: 10/04/2019  Recommendations for Outpatient Follow-up:  1. Follow up with PCP in 1-2 weeks 2. Please obtain CMP/CBC in one week 3. Please ensure follow-up with ID and podiatry.  Admitted From:  Home  Disposition: [place]: No  Equipment/Devices: None  Discharge Condition: Stable  CODE STATUS: FULL CODE  Diet recommendation:  Diet Order            Diet - low sodium heart healthy           Diet Carb Modified           Diet Carb Modified Fluid consistency: Thin; Room service appropriate? Yes  Diet effective now                 Brief Narrative: Patient is a 34 y.o. 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  See below for further details.  Significant events: 6/26>> admit to [facility] for diabetic foot infection.  Significant studies: 6/27: MRI right foot>> T2 hyperintensity within the fourth/fifth metatarsal heads-nonspecific but could be secondary to osteomyelitis, hyperemia, early bone marrow edema 6/27: MRI left foot>> small peripheral enhancing fluid collections along the plantar/lateral aspect of fifth metatarsal base-suspicious for abscess 6/26: Lower extremity Doppler>> no DVT  Antimicrobial therapy: Rocephin: 6/26>> Flagyl: 6/26>> Vancomycin: 6/26>>  Microbiology data: 6/30: Blood culture>> no growth so far 6/26: Blood cultures>> no growth  6/26: Wound culture>> MRSA  Procedures : 6/30>> irrigation and debridement of bilateral lower extremity wounds.  Consults: ID Podiatry  Brief Hospital Course: Bilateral diabetic foot infections-possible osteomyelitis of right foot: Remains stable  overnight-underwent incision and debridement to bilateral foot by podiatry yesterday-managed with IV antibiotics (see above) wound cultures still pending-patient is not keen on going home on IV antibiotics-hence after discussion with ID-patient has been switched to oral antibiotics.  Patient will follow with ID and podiatry for further continued care.  Spoke with Dr. Elspeth Cho on-call on the day of discharge-keep dressings intact until follow-up.    DM-1 with uncontrolled hyperglycemia (A1c 11.7 on 6/26): CBG stable overnight-continue Lantus 40 units and SSI.  Follow and optimize.  Normocytic anemia: Related to acute illness-appears mild-follow.  Nutrition Problem: Nutrition Problem: Increased nutrient needs Etiology: wound healing Signs/Symptoms: estimated needs Interventions: MVI, Magic cup, Premier Protein  Obesity: Estimated body mass index is 30.82 kg/m as calculated from the following:   Height as of this encounter: 6' 5"  (1.956 m).   Weight as of this encounter: 117.9 kg.    Discharge Diagnoses:  Principal Problem:   Diabetic foot infection ([place]) Active Problems:   Uncontrolled type 1 diabetes mellitus with hyperglycemia (HCC)   Normocytic anemia   Diabetic foot ulcer associated with type 1 diabetes mellitus (HCC)   Leg swelling   Cellulitis of right lower extremity   Type 2 diabetes mellitus with foot ulcer ([place])   Discharge Instructions:  Activity:  Weightbearing as tolerated in surgical shoes   Discharge Instructions    Call MD for:  redness, tenderness, or signs of infection (pain, swelling, redness, odor or green/yellow discharge around incision site)   Complete by: As directed    Diet - low sodium heart healthy   Complete by: As directed  Diet Carb Modified   Complete by: As directed    Discharge instructions   Complete by: As directed    Follow with Primary MD  [name], MD in 1-2 weeks  Follow at the infectious disease clinic on  7/21  Follow with podiatry in the next few days-please call their office for further discharge instructions/dressing changes instructions.  Please get a complete blood count and chemistry panel checked by your Primary MD at your next visit, and again as instructed by your Primary MD.  Get Medicines reviewed and adjusted: Please take all your medications with you for your next visit with your Primary MD  Laboratory/radiological data: Please request your Primary MD to go over all hospital tests and procedure/radiological results at the follow up, please ask your Primary MD to get all Hospital records sent to his/her office.  In some cases, they will be blood work, cultures and biopsy results pending at the time of your discharge. Please request that your primary care M.D. follows up on these results.  Also Note the following: If you experience worsening of your admission symptoms, develop shortness of breath, life threatening emergency, suicidal or homicidal thoughts you must seek medical attention immediately by calling 911 or calling your MD immediately  if symptoms less severe.  You must read complete instructions/literature along with all the possible adverse reactions/side effects for all the Medicines you take and that have been prescribed to you. Take any new Medicines after you have completely understood and accpet all the possible adverse reactions/side effects.   Do not drive when taking Pain medications or sleeping medications (Benzodaizepines)  Do not take more than prescribed Pain, Sleep and Anxiety Medications. It is not advisable to combine anxiety,sleep and pain medications without talking with your primary care practitioner  Special Instructions: If you have smoked or chewed Tobacco  in the last 2 yrs please stop smoking, stop any regular Alcohol  and or any Recreational drug use.  Wear Seat belts while driving.  Please note: You were cared for by a hospitalist during your  hospital stay. Once you are discharged, your primary care physician will handle any further medical issues. Please note that NO REFILLS for any discharge medications will be authorized once you are discharged, as it is imperative that you return to your primary care physician (or establish a relationship with a primary care physician if you do not have one) for your post hospital discharge needs so that they can reassess your need for medications and monitor your lab values.   Increase activity slowly   Complete by: As directed    Leave dressing on - Keep it clean, dry, and intact until clinic visit   Complete by: As directed    Please call podiatry office in the next 2 days for further instructions.     Allergies as of 10/04/2019      Reactions   Basaglar Kwikpen [insulin Glargine] Diarrhea   Metformin And Related Diarrhea, Nausea And Vomiting   Trulicity [dulaglutide] Diarrhea      Medication List    STOP taking these medications   doxycycline 100 MG capsule Commonly known as: VIBRAMYCIN     TAKE these medications   FreeStyle Libre 14 Day Sensor Misc Inject 1 patch into the skin every 14 (fourteen) days.   Continuous Blood Gluc Sensor Misc 1 each by Does not apply route as directed. Use as directed every 14 days. May dispense FreeStyle [employer] or similar.   Ensure Max  Protein Liqd Take 330 mLs (11 oz total) by mouth 2 (two) times daily.   insulin aspart 100 UNIT/ML injection Commonly known as: novoLOG Inject 0-15 Units into the skin 3 (three) times daily with meals. What changed:   how much to take  when to take this  reasons to take this   Lantus SoloStar 100 UNIT/ML Solostar Pen Generic drug: insulin glargine Inject 40 Units into the skin daily before breakfast.   levofloxacin 750 MG tablet Commonly known as: LEVAQUIN Take 1 tablet (750 mg total) by mouth daily for 28 days.   linezolid 600 MG tablet Commonly known as: ZYVOX Take 1 tablet (600 mg  total) by mouth 2 (two) times daily for 28 days.   metroNIDAZOLE 500 MG tablet Commonly known as: FLAGYL Take 1 tablet (500 mg total) by mouth 3 (three) times daily for 28 days.            Discharge Care Instructions  (From admission, onward)         Start     Ordered   10/04/19 0000  Leave dressing on - Keep it clean, dry, and intact until clinic visit        10/04/19 1314          Follow-up Information    [name], [name], MD. Schedule an appointment as soon as possible for a visit in 1 week(s).   Specialty: Internal Medicine Contact information: [address] [place] [address] [phone]        Trula Slade, DPM. Schedule an appointment as soon as possible for a visit.   Specialty: Podiatry Contact information: [address]  [place] [address] [phone]              Allergies  Allergen Reactions  . Basaglar Cla[place]orne Rigg [Insulin Glargine] Diarrhea  . Metformin And Related Diarrhea and Nausea And Vomiting  . Trulicity [Dulaglutide] Diarrhea    Other Procedures/Studies: MR FOOT RIGHT W WO CONTRAST  Result Date: 09/29/2019 CLINICAL DATA:  Diabetic with history of partial left 5th ray amputation in February. Increasing right foot pain, swelling and drainage. Elevated ESR. EXAM: MRI OF THE RIGHT FOREFOOT WITHOUT AND WITH CONTRAST TECHNIQUE: Multiplanar, multisequence MR imaging of the right forefoot was performed before and after the administration of intravenous contrast. CONTRAST:  81m GADAVIST GADOBUTROL 1 MMOL/ML IV SOLN COMPARISON:  Radiographs 09/28/2019 and 03/04/2015. FINDINGS: Despite efforts by the technologist and patient, moderate motion artifact is present on today's exam and could not be eliminated. This reduces exam sensitivity and specificity. Bones/Joint/Cartilage There is soft tissue ulceration lateral to the 5th metatarsophalangeal joint. Evaluation of the in adjacent toes and distal metatarsals is  limited by motion and incomplete fat saturation on the T2 weighted images. There is low-level T2 hyperintensity within the 4th and 5th metatarsal heads and the adjacent proximal phalanges. There is no abnormal T1 signal or cortical destruction. No significant joint effusions or erosive changes. The 1st through 3rd rays appear normal. No tarsal bone abnormalities or significant midfoot degenerative changes. Ligaments Intact Lisfranc ligament. The collateral ligaments of the metatarsophalangeal joints appear intact. Muscles and Tendons Diffuse low-level muscular T2 hyperintensity and enhancement, most consistent with diabetic myopathy. Mild tenosynovitis and synovial enhancement associated with the extensor digitorum tendons at the level of the midfoot, best seen on image 2/22. Soft tissues As above, soft tissue ulceration lateral to the 5th MTP joint with underlying small superficial fluid collection, best seen on image 23/23 and 4/21.  No other focal fluid collections are seen. There is heterogeneous enhancement throughout the soft tissues of the lateral forefoot. No evidence of foreign body or definite septic joint. IMPRESSION: 1. Soft tissue ulceration lateral to the 5th MTP joint. There is low-level T2 hyperintensity within the 4th and 5th metatarsal heads and adjacent proximal phalanges without abnormal T1 signal or cortical destruction. These findings are nonspecific and could be seen with early marrow edema, hyperemia or early osteomyelitis. No evidence of septic joint. 2. Mild tenosynovitis and synovial enhancement associated with the extensor digitorum tendons at the level of the midfoot. 3. Diffuse low-level muscular T2 hyperintensity and enhancement, most consistent with diabetic myopathy. Electronically Signed   By: Richardean Sale M.D.   On: 09/29/2019 17:32   MR FOOT LEFT W WO CONTRAST  Result Date: 09/29/2019 CLINICAL DATA:  Diabetic with history of partial left 5th ray amputation in February.  Increasing foot pain, swelling and drainage. Elevated ESR. EXAM: MRI OF THE LEFT FOREFOOT WITHOUT AND WITH CONTRAST TECHNIQUE: Multiplanar, multisequence MR imaging of the left forefoot was performed both before and after administration of intravenous contrast. CONTRAST:  55m GADAVIST GADOBUTROL 1 MMOL/ML IV SOLN COMPARISON:  Radiographs 02/03/2019 and 09/28/2019.  MRI 02/04/2019. FINDINGS: Bones/Joint/Cartilage Since the previous MRI, the patient has undergone amputation through the mid 5th metatarsal. The amputation margins are sharp. There is low-level bone marrow edema and enhancement within the 5th metatarsal remnant. There is no abnormal T1 signal. The additional metatarsals and digits appear normal. The visualized tarsal bones appear normal. The alignment is normal at the Lisfranc joint. Ligaments Intact Lisfranc ligament. 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 No focal abnormality or significant tenosynovitis. Soft tissues Apparent skin ulceration inferior and lateral to the 5th metatarsal base with underlying heterogeneous T2 signal and enhancement in the subcutaneous fat. There are small peripherally enhancing fluid collections along the plantar and lateral aspects of the 5th metatarsal base which are best seen on series 13 and 15. No other focal fluid collections. No evidence of foreign body as correlated with recent radiographs. IMPRESSION: 1. Apparent skin ulceration inferior and lateral to the 5th metatarsal base with underlying heterogeneous T2 signal and enhancement in the subcutaneous fat. Small peripherally enhancing fluid collections along the plantar and lateral aspects of the 5th metatarsal base suspicious for abscesses. 2. Interval amputation through the mid 5th metatarsal with nonspecific low-level marrow edema and enhancement. Given the proximity to the adjacent soft tissue inflammatory changes, osteomyelitis cannot be  excluded. 3. The additional bones appear unremarkable. Electronically Signed   By: WRichardean SaleM.D.   On: 09/29/2019 17:14   DG Foot 2 Views Left  Result Date: 10/02/2019 CLINICAL DATA:  Postop debridement of wounds with bone biopsy. EXAM: LEFT FOOT - 2 VIEW COMPARISON:  Radiograph 09/28/2019 FINDINGS: Remote fifth ray transmetatarsal amputation with mild heterotopic ossification at the operative site. Linear lucency through the base of the fifth metatarsal is likely site of bone biopsy. No frank bony destruction. Minimal skin irregularity lateral to the fifth proximal metatarsal. No other interval change. Mild hammertoe deformity of the digits. IMPRESSION: Prior fifth ray transmetatarsal amputation. Linear lucency through the base of the fifth metatarsal likely site of bone biopsy. Electronically Signed   By: MKeith RakeM.D.   On: 10/02/2019 21:48   DG Foot 2 Views Right  Result Date: 10/02/2019 CLINICAL DATA:  Postop debridement of wounds with bone biopsy. EXAM: RIGHT FOOT - 2 VIEW COMPARISON:  Radiograph  09/28/2019. MRI 09/09/2019 FINDINGS: Skin and soft tissue defect noted about the lateral aspect of the forefoot. Linear lucencies through the distal fourth and fifth metatarsals may represent site of bone biopsies. There is no evidence of frank bony destruction. No periosteal reaction. Unchanged alignment. IMPRESSION: Soft tissue and skin defect about the lateral forefoot. Linear lucencies through the distal fourth and fifth metatarsals may represent site of bone biopsies. Electronically Signed   By: Keith Rake M.D.   On: 10/02/2019 21:46   DG Foot Complete Left  Result Date: 09/30/2019 Please see detailed radiograph report in office note.  DG Foot Complete Right  Result Date: 09/30/2019 Please see detailed radiograph report in office note.  DG Foot Complete Right  Result Date: 09/28/2019 CLINICAL DATA:  Chronic wound and soft tissue swelling lateral to the 5th MTP joint on the  right. Diabetes. EXAM: RIGHT FOOT COMPLETE - 3+ VIEW COMPARISON:  Earlier today elsewhere. FINDINGS: Shallow, elongated soft tissue defect in the lateral aspect of the distal foot. No soft tissue gas, bone destruction or periosteal reaction. No fractures are seen. IMPRESSION: Shallow, elongated soft tissue defect in the lateral aspect of the distal foot without evidence of underlying osteomyelitis. Electronically Signed   By: Claudie Revering M.D.   On: 09/28/2019 14:23   Intravitreal Injection, Pharmacologic Agent - OS - Left Eye  Result Date: 09/24/2019 Time Out 09/24/2019. 11:39 AM. Confirmed correct patient, procedure, site, and patient consented. Anesthesia Topical anesthesia was used. Anesthetic medications included Akten 3.5%. Procedure Preparation included Ofloxacin , 10% betadine to eyelids. A 30 gauge needle was used. Injection: 2 mg aflibercept Alfonse Flavors) SOLN   NDC: [id number], Lot: [id number]   Route: Intravitreal, Site: Left Eye, Waste: 0 mg Post-op Post injection exam found visual acuity of at least counting fingers. The patient tolerated the procedure well. There were no complications. The patient received written and verbal post procedure care education. Post injection medications were not given.   OCT, Retina - OU - Both Eyes  Result Date: 09/24/2019 Right Eye Quality was good. Scan locations included subfoveal. Central Foveal Thickness: 248. Progression has been stable. Left Eye Quality was good. Scan locations included subfoveal. Central Foveal Thickness: 258. Progression has improved. Notes OS improved status post injection Eylea number 1, will repeat injection today and examination at longer interval 8 weeks.  VAS [name] LOWER EXTREMITY VENOUS (DVT) (ONLY [facility] & [facility])  Result Date: 09/28/2019  Lower Venous DVTStudy Indications: Edema, and ulceration.  Risk Factors: Surgery 05/16/2019 - Left 5th ray amputation (Left Fifth Toe). Limitations: Poor ultrasound/tissue interface. Comparison Study: No  prior studies. Performing Technologist: Oliver Hum RVT  Examination Guidelines: A complete evaluation includes B-mode imaging, spectral Doppler, color Doppler, and power Doppler as needed of all accessible portions of each vessel. Bilateral testing is considered an integral part of a complete examination. Limited examinations for reoccurring indications may be performed as noted. The reflux portion of the exam is performed with the patient in reverse Trendelenburg.  +---------+---------------+---------+-----------+----------+--------------+ RIGHT    CompressibilityPhasicitySpontaneityPropertiesThrombus Aging +---------+---------------+---------+-----------+----------+--------------+ CFV      Full           Yes      Yes                                 +---------+---------------+---------+-----------+----------+--------------+ SFJ      Full                                                        +---------+---------------+---------+-----------+----------+--------------+  FV Prox  Full                                                        +---------+---------------+---------+-----------+----------+--------------+ FV Mid   Full                                                        +---------+---------------+---------+-----------+----------+--------------+ FV DistalFull           Yes      Yes                                 +---------+---------------+---------+-----------+----------+--------------+ PFV      Full                                                        +---------+---------------+---------+-----------+----------+--------------+ POP      Full           Yes      Yes                                 +---------+---------------+---------+-----------+----------+--------------+ PTV      Full                                                        +---------+---------------+---------+-----------+----------+--------------+ PERO     Full                                                         +---------+---------------+---------+-----------+----------+--------------+ Multiphasic flow is noted in the right popliteal, posterior tibial, peroneal, and anterior tibial arteries.  +----+---------------+---------+-----------+----------+--------------+ LEFTCompressibilityPhasicitySpontaneityPropertiesThrombus Aging +----+---------------+---------+-----------+----------+--------------+ CFV Full           Yes      Yes                                 +----+---------------+---------+-----------+----------+--------------+     Summary: RIGHT: - There is no evidence of deep vein thrombosis in the lower extremity. However, portions of this examination were limited- see technologist comments above.  - No cystic structure found in the popliteal fossa.  LEFT: - No evidence of common femoral vein obstruction.  *See table(s) above for measurements and observations. Electronically signed by Deitra Mayo MD on 09/28/2019 at 6:46:39 PM.    Final    [name] EKG SITE RITE  Result Date: 10/02/2019 If Site Rite image not attached, placement could not be confirmed due to current cardiac rhythm.    TODAY-DAY OF DISCHARGE:  Subjective:  [name] today has no headache,no chest abdominal pain,no new weakness tingling or numbness, feels much better wants to go home today.  Objective:   Blood pressure (!) 128/91, pulse 72, temperature 97.9 F (36.6 C), temperature source Oral, resp. rate 16, height 6' 5"  (1.956 m), weight 117.9 kg, SpO2 100 %.  Intake/Output Summary (Last 24 hours) at 10/04/2019 1315 Last data filed at 10/04/2019 0550 Gross per 24 hour  Intake 350 ml  Output 600 ml  Net -250 ml   Filed Weights   09/28/19 1135 10/02/19 1822  Weight: 117.9 kg 117.9 kg    Exam: Awake Alert, Oriented *3, No new F.N deficits, Normal affect [place].AT,PERRAL Supple Neck,No JVD, No cervical lymphadenopathy appriciated.  Symmetrical Chest wall movement,  Good air movement bilaterally, CTAB RRR,No Gallops,Rubs or new Murmurs, No Parasternal Heave +ve B.Sounds, Abd Soft, Non tender, No organomegaly appriciated, No rebound -guarding or rigidity. No Cyanosis, Clubbing or edema, No new Rash or bruise   PERTINENT RADIOLOGIC STUDIES: DG Foot 2 Views Left  Result Date: 10/02/2019 CLINICAL DATA:  Postop debridement of wounds with bone biopsy. EXAM: LEFT FOOT - 2 VIEW COMPARISON:  Radiograph 09/28/2019 FINDINGS: Remote fifth ray transmetatarsal amputation with mild heterotopic ossification at the operative site. Linear lucency through the base of the fifth metatarsal is likely site of bone biopsy. No frank bony destruction. Minimal skin irregularity lateral to the fifth proximal metatarsal. No other interval change. Mild hammertoe deformity of the digits. IMPRESSION: Prior fifth ray transmetatarsal amputation. Linear lucency through the base of the fifth metatarsal likely site of bone biopsy. Electronically Signed   By: Keith Rake M.D.   On: 10/02/2019 21:48   DG Foot 2 Views Right  Result Date: 10/02/2019 CLINICAL DATA:  Postop debridement of wounds with bone biopsy. EXAM: RIGHT FOOT - 2 VIEW COMPARISON:  Radiograph 09/28/2019. MRI 09/09/2019 FINDINGS: Skin and soft tissue defect noted about the lateral aspect of the forefoot. Linear lucencies through the distal fourth and fifth metatarsals may represent site of bone biopsies. There is no evidence of frank bony destruction. No periosteal reaction. Unchanged alignment. IMPRESSION: Soft tissue and skin defect about the lateral forefoot. Linear lucencies through the distal fourth and fifth metatarsals may represent site of bone biopsies. Electronically Signed   By: Keith Rake M.D.   On: 10/02/2019 21:46   [name] EKG SITE RITE  Result Date: 10/02/2019 If Site Rite image not attached, placement could not be confirmed due to current cardiac rhythm.    PERTINENT LAB RESULTS: CBC: Recent Labs     10/02/19 0403 10/03/19 0352  WBC 6.2 6.1  HGB 10.7* 10.6*  HCT 33.3* 34.4*  PLT 374 306   CMET CMP     Component Value Date/Time   NA 137 10/03/2019 0352   K 3.8 10/03/2019 0352   CL 102 10/03/2019 0352   CO2 25 10/03/2019 0352   GLUCOSE 123 (H) 10/03/2019 0352   BUN 9 10/03/2019 0352   CREATININE 0.81 10/03/2019 0352   CALCIUM 9.1 10/03/2019 0352   PROT 8.0 09/28/2019 1151   ALBUMIN 2.8 (L) 09/28/2019 1151   AST 12 (L) 09/28/2019 1151   ALT 13 09/28/2019 1151   ALKPHOS 77 09/28/2019 1151   BILITOT 0.4 09/28/2019 1151   GFRNONAA >60 10/03/2019 0352   GFRAA >60 10/03/2019 0352    GFR Estimated Creatinine Clearance: 184.6 mL/min (by C-G formula based on SCr of 0.81 mg/dL). No results for input(s): LIPASE, AMYLASE in the last 72 hours. No results  for input(s): CKTOTAL, CKMB, CKMBINDEX, TROPONINI in the last 72 hours. Invalid input(s): POCBNP No results for input(s): DDIMER in the last 72 hours. No results for input(s): HGBA1C in the last 72 hours. No results for input(s): CHOL, HDL, LDLCALC, TRIG, CHOLHDL, LDLDIRECT in the last 72 hours. No results for input(s): TSH, T4TOTAL, T3FREE, THYROIDAB in the last 72 hours.  Invalid input(s): FREET3 No results for input(s): VITAMINB12, FOLATE, FERRITIN, TIBC, IRON, RETICCTPCT in the last 72 hours. Coags: No results for input(s): INR in the last 72 hours.  Invalid input(s): PT Microbiology: Recent Results (from the past 240 hour(s))  WOUND CULTURE     Status: Abnormal   Collection Time: 09/28/19 12:08 PM   Specimen: Foot, Right; Wound  Result Value Ref Range Status   MICRO NUMBER: [id number]  Final   SPECIMEN QUALITY: Adequate  Final   SOURCE: FOOT, RIGHT  Final   STATUS: FINAL  Final   GRAM STAIN: Gram positive cocci in chains  Final    Comment: Few White blood cells seen Many epithelial cells Moderate Gram positive cocci in clusters Moderate Gram positive cocci in chains   ISOLATE 1: methicillin resistant Staphylococcus  aureus (A)  Final    Comment: Heavy growth of Methicillin resistant Staphylococcus aureus (MRSA) Negative for inducible clindamycin resistance.      Susceptibility   Methicillin resistant staphylococcus aureus - AEROBIC CULT, GRAM STAIN POSITIVE 1    VANCOMYCIN 1 Sensitive     CIPROFLOXACIN >=8 Resistant     CLINDAMYCIN <=0.25 Sensitive     LEVOFLOXACIN 4 Resistant     ERYTHROMYCIN >=8 Resistant     GENTAMICIN <=0.5 Sensitive     OXACILLIN* >=4 Resistant      * Oxacillin-resistant staphylococci are resistant toall currently available beta-lactam antimicrobialagents including penicillins, beta lactam/beta-lactamase inhibitor combinations, and cephems withstaphylococcal indications, including Cefazolin.    TETRACYCLINE <=1 Sensitive     TRIMETH/SULFA* <=10 Sensitive      * Oxacillin-resistant staphylococci are resistant toall currently available beta-lactam antimicrobialagents including penicillins, beta lactam/beta-lactamase inhibitor combinations, and cephems withstaphylococcal indications, including Cefazolin.Legend:S = Susceptible  I = IntermediateR = Resistant  NS = Not susceptible* = Not tested  NR = Not reported**NN = See antimicrobic comments  SARS Coronavirus 2 by RT PCR (hospital order, performed in [facility] hospital lab) Nasopharyngeal Nasopharyngeal Swab     Status: None   Collection Time: 09/28/19  3:14 PM   Specimen: Nasopharyngeal Swab  Result Value Ref Range Status   SARS Coronavirus 2 NEGATIVE NEGATIVE Final    Comment: (NOTE) SARS-CoV-2 target nucleic acids are NOT DETECTED.  The SARS-CoV-2 RNA is generally detectable in upper and lower respiratory specimens during the acute phase of infection. The lowest concentration of SARS-CoV-2 viral copies this assay can detect is 250 copies / mL. A negative result does not preclude SARS-CoV-2 infection and should not be used as the sole basis for treatment or other patient management decisions.  A negative result may occur  with improper specimen collection / handling, submission of specimen other than nasopharyngeal swab, presence of viral mutation(s) within the areas targeted by this assay, and inadequate number of viral copies (<250 copies / mL). A negative result must be combined with clinical observations, patient history, and epidemiological information.  Fact Sheet for Patients:   StrictlyIdeas.no  Fact Sheet for Healthcare Providers: BankingDealers.co.za  This test is not yet approved or  cleared by the [place] FDA and has been authorized for detection and/or diagnosis of SARS-CoV-2 by  FDA under an Emergency Use Authorization (EUA).  This EUA will remain in effect (meaning this test can be used) for the duration of the COVID-19 declaration under Section 564(b)(1) of the Act, 21 U.S.C. section 360bbb-3(b)(1), unless the authorization is terminated or revoked sooner.  Performed at [facility], [place] [address] [place], [place] [address]   Blood Cultures x 2 sites     Status: None   Collection Time: 09/28/19  7:16 PM   Specimen: BLOOD  Result Value Ref Range Status   Specimen Description BLOOD RIGHT ANTECUBITAL  Final   Special Requests   Final    BOTTLES DRAWN AEROBIC AND ANAEROBIC Blood Culture adequate volume   Culture   Final    NO GROWTH 5 DAYS Performed at [facility], [place] [address], [name], [place] [address]    Report Status 10/03/2019 FINAL  Final  Blood Cultures x 2 sites     Status: None   Collection Time: 09/28/19  7:24 PM   Specimen: BLOOD RIGHT FOREARM  Result Value Ref Range Status   Specimen Description BLOOD RIGHT FOREARM  Final   Special Requests   Final    BOTTLES DRAWN AEROBIC AND ANAEROBIC Blood Culture adequate volume   Culture   Final    NO GROWTH 5 DAYS Performed at [facility], [place] [address] [place], [place] [address]    Report Status 10/03/2019 FINAL  Final  Surgical pcr screen      Status: Abnormal   Collection Time: 10/02/19 12:48 AM   Specimen: Nasal Mucosa; Nasal Swab  Result Value Ref Range Status   MRSA, PCR POSITIVE (A) NEGATIVE Final    Comment: RESULT CALLED TO, READ BACK BY AND VERIFIED WITH: RHINEHART,D RN AT 0329 10/02/2019 MITCHELL,L    Staphylococcus aureus POSITIVE (A) NEGATIVE Final    Comment: (NOTE) The Xpert SA Assay (FDA approved for NASAL specimens in patients 79 years of age and older), is one component of a comprehensive surveillance program. It is not intended to diagnose infection nor to guide or monitor treatment. Performed at [facility], [place] [address], [place], [place] [address]   Aerobic/Anaerobic Culture (surgical/deep wound)     Status: None (Preliminary result)   Collection Time: 10/02/19  7:46 PM   Specimen: PATH Bone biopsy; Tissue  Result Value Ref Range Status   Specimen Description BONE  Final   Special Requests LEFT METATARSAL SAMPLE A  Final   Gram Stain NO WBC SEEN NO ORGANISMS SEEN   Final   Culture   Final    CULTURE REINCUBATED FOR BETTER GROWTH Performed at [facility], [address] [place], [place] [address]    Report Status PENDING  Incomplete  Aerobic/Anaerobic Culture (surgical/deep wound)     Status: None (Preliminary result)   Collection Time: 10/02/19  7:57 PM   Specimen: PATH Bone biopsy; Tissue  Result Value Ref Range Status   Specimen Description BONE  Final   Special Requests RIGHT 4 METATARSAL SAMPLE B  Final   Gram Stain NO WBC SEEN NO ORGANISMS SEEN   Final   Culture   Final    CULTURE REINCUBATED FOR BETTER GROWTH Performed at [facility], [address] [place],  [address]    Report Status PENDING  Incomplete  Aerobic/Anaerobic Culture (surgical/deep wound)     Status: None (Preliminary result)   Collection Time: 10/02/19  7:57 PM   Specimen: PATH Bone biopsy; Tissue  Result Value Ref Range Status   Specimen Description BONE  Final   Special Requests  RIGHT 5 METATARSAL SAMPLE C  Final   Gram Stain NO WBC SEEN NO ORGANISMS SEEN   Final   Culture   Final    RARE STAPHYLOCOCCUS AUREUS SUSCEPTIBILITIES TO FOLLOW Performed at [facility], [address], [place], [place] [address]    Report Status PENDING  Incomplete    FURTHER DISCHARGE INSTRUCTIONS:  Get Medicines reviewed and adjusted: Please take all your medications with you for your next visit with your Primary MD  Laboratory/radiological data: Please request your Primary MD to go over all hospital tests and procedure/radiological results at the follow up, please ask your Primary MD to get all Hospital records sent to his/her office.  In some cases, they will be blood work, cultures and biopsy results pending at the time of your discharge. Please request that your primary care M.D. goes through all the records of your hospital data and follows up on these results.  Also Note the following: If you experience worsening of your admission symptoms, develop shortness of breath, life threatening emergency, suicidal or homicidal thoughts you must seek medical attention immediately by calling 911 or calling your MD immediately  if symptoms less severe.  You must read complete instructions/literature along with all the possible adverse reactions/side effects for all the Medicines you take and that have been prescribed to you. Take any new Medicines after you have completely understood and accpet all the possible adverse reactions/side effects.   Do not drive when taking Pain medications or sleeping medications (Benzodaizepines)  Do not take more than prescribed Pain, Sleep and Anxiety Medications. It is not advisable to combine anxiety,sleep and pain medications without talking with your primary care practitioner  Special Instructions: If you have smoked or chewed Tobacco  in the last 2 yrs please stop smoking, stop any regular Alcohol  and or any Recreational drug use.  Wear Seat  belts while driving.  Please note: You were cared for by a hospitalist during your hospital stay. Once you are discharged, your primary care physician will handle any further medical issues. Please note that NO REFILLS for any discharge medications will be authorized once you are discharged, as it is imperative that you return to your primary care physician (or establish a relationship with a primary care physician if you do not have one) for your post hospital discharge needs so that they can reassess your need for medications and monitor your lab values.  Total Time spent coordinating discharge including counseling, education and face to face time equals 35 minutes.  Signed: Lailanie Hasley 10/04/2019 1:15 PM

## 2019-10-04 NOTE — Plan of Care (Signed)
  Problem: Education: Goal: Knowledge of General Education information will improve Description: Including pain rating scale, medication(s)/side effects and non-pharmacologic comfort measures Outcome: Progressing   Problem: Health Behavior/Discharge Planning: Goal: Ability to manage health-related needs will improve Outcome: Progressing   Problem: Pain Managment: Goal: General experience of comfort will improve Outcome: Progressing   Problem: Safety: Goal: Ability to remain free from injury will improve Outcome: Progressing   

## 2019-10-04 NOTE — Progress Notes (Signed)
Pharmacy Antibiotic Note  [name] is a 34 y.o. male admitted on 09/28/2019 with diabetic foot infection, suspected osteomyelitis.     Vancomycin trough 16, at goal. Patient refuses PICC placement, will discharge on orals per ID.   Plan: Per ID, discharge today on oral linezolid, levofloxacin and metronidazole  Monitor renal function, CBC  Height: 6\' 5"  (195.6 cm) Weight: 117.9 kg (259 lb 14.8 oz) IBW/kg (Calculated) : 89.1  Temp (24hrs), Avg:97.9 F (36.6 C), Min:97.3 F (36.3 C), Max:98.5 F (36.9 C)  Recent Labs  Lab 09/28/19 1151 09/29/19 0506 09/30/19 0430 10/01/19 1648 10/02/19 0403 10/03/19 0352 10/04/19 0817  WBC 6.5 5.4 5.4  --  6.2 6.1  --   CREATININE 1.02 0.93 0.93  --  0.96 0.81  --   LATICACIDVEN 0.8  --   --   --   --   --   --   VANCOTROUGH  --   --   --  21*  --   --  16    Estimated Creatinine Clearance: 184.6 mL/min (by C-G formula based on SCr of 0.81 mg/dL).    Allergies  Allergen Reactions  . Basaglar 12/05/19 [Insulin Glargine] Diarrhea  . Metformin And Related Diarrhea and Nausea And Vomiting  . Trulicity [Dulaglutide] Diarrhea   Antimicrobials 6/26: Ceftriaxone >> 6/26 Flagyl >> 6/27: Vancomycin >>  7/1 VT 16  6/29: VT 21: Decr to 750mg /8hr  Microbiology  6/30 Bone cx - Rare staph aureus 6/30 Tissue cx- ngtd  6/30 Bcx NGTD 6/30 MRSA +   7/30, PharmD, BCPS, [facility] Clinical Pharmacist  Please check AMION for all [facility] phone numbers After 10:00 PM, call Main Pharmacy [phone]

## 2019-10-04 NOTE — Progress Notes (Signed)
Patient discharged to home with mother. IV was removed. Belongings are given to mother. All questions are answered. Discharged instructions provided and pt verbalized understanding.

## 2019-10-04 NOTE — Progress Notes (Signed)
Patient ID: [name], male   DOB: [birth year], 34 y.o.   MRN: [id number]         [facility]  Date of Admission:  09/28/2019   Total days of antibiotics 7         ASSESSMENT: He has bilateral, most likely polymicrobial diabetic foot infections.  Past cultures have grown Serratia, staph lugdunensis and Enterococcus.  Recent left foot culture has grown MRSA and right foot cultures are growing staph aureus with susceptibilities pending.  He is resistant to PICC placement.  I recommend discharge on oral linezolid, levofloxacin and metronidazole.  He does not drink alcohol.  PLAN: 1. Discharge today on oral linezolid, levofloxacin and metronidazole (these will be delivered to him from the [facility] pharmacy before discharge) 2. I have arranged follow-up with me on 10/23/2019.  I will sign off now.  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 Chlorhexidine Gluconate Cloth  6 each Topical Q0600  . enoxaparin (LOVENOX) injection  40 mg Subcutaneous Q24H  . insulin aspart  0-5 Units Subcutaneous QHS  . insulin aspart  0-9 Units Subcutaneous TID WC  . insulin glargine  40 Units Subcutaneous QAC breakfast  . metroNIDAZOLE  500 mg Oral Q8H  . multivitamin with minerals  1 tablet Oral Daily  . mupirocin ointment  1 application Nasal BID  . Ensure Max Protein  11 oz Oral BID  . sodium chloride flush  3 mL Intravenous Once   Continuous Infusions: . sodium chloride Stopped (10/03/19 1659)  . cefTRIAXone (ROCEPHIN)  IV 2 g (10/03/19 1709)  . lactated ringers    . vancomycin 750 mg (10/04/19 0939)   PRN Meds:.sodium chloride, acetaminophen **OR**  acetaminophen, albuterol, HYDROcodone-acetaminophen, ondansetron **OR** ondansetron (ZOFRAN) IV   SUBJECTIVE: He underwent incision and drainage of the right foot wounds with bone biopsies yesterday.  No organisms were seen on any of the Gram stain's.  He is upset with me that I did not tell him that a PICC line will go although in the veins in his upper chest.  Review of Systems: Review of Systems  Constitutional: Negative for chills, diaphoresis and fever.  Musculoskeletal: Negative for joint pain.    Allergies  Allergen Reactions  . Basaglar Stephanie CoupKwikpen [Insulin Glargine] Diarrhea  . Metformin And Related Diarrhea and Nausea And Vomiting  . Trulicity [Dulaglutide] Diarrhea    OBJECTIVE: Vitals:   10/03/19 0748 10/03/19 2016 10/04/19 0359 10/04/19 1005  BP: 124/78 124/75 101/60 (!) 128/91  Pulse: 77 87 80 72  Resp: 16 16 14 16   Temp: 97.8 F (36.6 C) 98.5 F (36.9 C) (!) 97.3 F (36.3 C) 97.9 F (36.6 C)  TempSrc: Oral Oral Oral Oral  SpO2: 99% 100% 99% 100%  Weight:      Height:       Body mass index is 30.82 kg/m.  Physical Exam Constitutional:      Comments: He is resting comfortably in bed looking at his phone.  Musculoskeletal:     Comments: Ace wraps on both feet.  Psychiatric:        Mood and Affect: Mood normal.     Lab Results Lab Results  Component Value Date   WBC 6.1 10/03/2019   HGB 10.6 (L) 10/03/2019   HCT 34.4 (L) 10/03/2019   MCV 85.1 10/03/2019   PLT 306 10/03/2019    Lab Results  Component Value Date   CREATININE 0.81 10/03/2019   BUN 9 10/03/2019   NA 137 10/03/2019   K 3.8 10/03/2019   CL 102 10/03/2019   CO2 25 10/03/2019    Lab Results  Component Value Date   ALT 13 09/28/2019   AST 12 (L) 09/28/2019   ALKPHOS 77 09/28/2019   BILITOT 0.4 09/28/2019     Microbiology: Recent Results (from the past 240 hour(s))  WOUND CULTURE     Status: Abnormal   Collection Time: 09/28/19 12:08 PM   Specimen: Foot, Right; Wound  Result  Value Ref Range Status   MICRO NUMBER: [id number]  Final   SPECIMEN QUALITY: Adequate  Final   SOURCE: FOOT, RIGHT  Final   STATUS: FINAL  Final   GRAM STAIN: Gram positive cocci in chains  Final    Comment: Few White blood cells seen Many epithelial cells Moderate Gram positive cocci in clusters Moderate Gram positive cocci in chains   ISOLATE 1: methicillin resistant Staphylococcus aureus (A)  Final    Comment: Heavy growth of Methicillin resistant Staphylococcus aureus (MRSA) Negative for inducible clindamycin resistance.      Susceptibility   Methicillin resistant staphylococcus aureus - AEROBIC CULT, GRAM STAIN POSITIVE 1    VANCOMYCIN 1 Sensitive     CIPROFLOXACIN >=8 Resistant     CLINDAMYCIN <=0.25 Sensitive     LEVOFLOXACIN 4 Resistant     ERYTHROMYCIN >=8 Resistant     GENTAMICIN <=0.5 Sensitive     OXACILLIN* >=4 Resistant      * Oxacillin-resistant staphylococci are resistant toall currently available beta-lactam antimicrobialagents including penicillins, beta lactam/beta-lactamase inhibitor combinations, and cephems withstaphylococcal indications, including Cefazolin.    TETRACYCLINE <=1 Sensitive     TRIMETH/SULFA* <=10 Sensitive      * Oxacillin-resistant staphylococci are resistant toall currently available beta-lactam antimicrobialagents including penicillins, beta lactam/beta-lactamase inhibitor combinations, and cephems withstaphylococcal indications, including Cefazolin.Legend:S = Susceptible  I = IntermediateR = Resistant  NS = Not susceptible* = Not tested  NR = Not reported**NN = See antimicrobic comments  SARS Coronavirus 2 by RT PCR (hospital order, performed in [facility] lab) Nasopharyngeal Nasopharyngeal Swab     Status: None   Collection Time: 09/28/19  3:14 PM   Specimen: Nasopharyngeal Swab  Result Value Ref Range Status   SARS Coronavirus 2 NEGATIVE NEGATIVE Final    Comment: (NOTE) SARS-CoV-2 target nucleic acids are NOT DETECTED.  The SARS-CoV-2  RNA is generally detectable in upper and lower respiratory specimens during the acute phase of infection. The lowest concentration of SARS-CoV-2 viral copies this assay can detect is 250 copies / mL. A negative result does not preclude SARS-CoV-2 infection and should not be used as the sole basis for treatment or other patient management decisions.  A negative result may occur with improper specimen collection / handling, submission of specimen other than nasopharyngeal swab, presence of viral mutation(s) within the areas targeted by this assay, and inadequate number of viral copies (<250 copies / mL). A negative result must be combined with clinical observations, patient history, and epidemiological information.  Fact Sheet for Patients:   BoilerBrush.com.cy  Fact Sheet for Healthcare Providers: https://pope.com/  This test is not yet approved or  cleared by the [place] FDA and has been authorized  for detection and/or diagnosis of SARS-CoV-2 by FDA under an Emergency Use Authorization (EUA).  This EUA will remain in effect (meaning this test can be used) for the duration of the COVID-19 declaration under Section 564(b)(1) of the Act, 21 U.S.C. section 360bbb-3(b)(1), unless the authorization is terminated or revoked sooner.  Performed at [facility], [address], [place], [place] [address]   Blood Cultures x 2 sites     Status: None   Collection Time: 09/28/19  7:16 PM   Specimen: BLOOD  Result Value Ref Range Status   Specimen Description BLOOD RIGHT ANTECUBITAL  Final   Special Requests   Final    BOTTLES DRAWN AEROBIC AND ANAEROBIC Blood Culture adequate volume   Culture   Final    NO GROWTH 5 DAYS Performed at [facility], [address], [place], [place] [address]    Report Status 10/03/2019 FINAL  Final  Blood Cultures x 2 sites     Status: None   Collection Time: 09/28/19  7:24 PM   Specimen:  BLOOD RIGHT FOREARM  Result Value Ref Range Status   Specimen Description BLOOD RIGHT FOREARM  Final   Special Requests   Final    BOTTLES DRAWN AEROBIC AND ANAEROBIC Blood Culture adequate volume   Culture   Final    NO GROWTH 5 DAYS Performed at [facility], [address], [place], [place] [address]    Report Status 10/03/2019 FINAL  Final  Surgical pcr screen     Status: Abnormal   Collection Time: 10/02/19 12:48 AM   Specimen: Nasal Mucosa; Nasal Swab  Result Value Ref Range Status   MRSA, PCR POSITIVE (A) NEGATIVE Final    Comment: RESULT CALLED TO, READ BACK BY AND VERIFIED WITH: RHINEHART,D RN AT 0329 10/02/2019 MITCHELL,L    Staphylococcus aureus POSITIVE (A) NEGATIVE Final    Comment: (NOTE) The Xpert SA Assay (FDA approved for NASAL specimens in patients 5 years of age and older), is one component of a comprehensive surveillance program. It is not intended to diagnose infection nor to guide or monitor treatment. Performed at [facility], [address] [place], [place] [address]   Aerobic/Anaerobic Culture (surgical/deep wound)     Status: None (Preliminary result)   Collection Time: 10/02/19  7:46 PM   Specimen: PATH Bone biopsy; Tissue  Result Value Ref Range Status   Specimen Description BONE  Final   Special Requests LEFT METATARSAL SAMPLE A  Final   Gram Stain NO WBC SEEN NO ORGANISMS SEEN   Final   Culture   Final    CULTURE REINCUBATED FOR BETTER GROWTH Performed at [facility], [address] [place], [place] [address]    Report Status PENDING  Incomplete  Aerobic/Anaerobic Culture (surgical/deep wound)     Status: None (Preliminary result)   Collection Time: 10/02/19  7:57 PM   Specimen: PATH Bone biopsy; Tissue  Result Value Ref Range Status   Specimen Description BONE  Final   Special Requests RIGHT 4 METATARSAL SAMPLE B  Final   Gram Stain NO WBC SEEN NO ORGANISMS SEEN   Final   Culture   Final    CULTURE REINCUBATED FOR  BETTER GROWTH Performed at [facility], [address] [place], [place] [address]    Report Status PENDING  Incomplete  Aerobic/Anaerobic Culture (surgical/deep wound)     Status: None (Preliminary result)   Collection Time: 10/02/19  7:57 PM  Specimen: PATH Bone biopsy; Tissue  Result Value Ref Range Status   Specimen Description BONE  Final   Special Requests RIGHT 5 METATARSAL SAMPLE C  Final   Gram Stain NO WBC SEEN NO ORGANISMS SEEN   Final   Culture   Final    RARE STAPHYLOCOCCUS AUREUS SUSCEPTIBILITIES TO FOLLOW Performed at [facility], [address], [place], [place] [address]    Report Status PENDING  Incomplete    Cliffton Asters, MD [facility] [phone] pager   [phone] cell 10/04/2019, 11:04 AM

## 2019-10-04 NOTE — Op Note (Signed)
PATIENT:  [name]  34 y.o. male  PRE-OPERATIVE DIAGNOSIS:  Diabetic ulcers both lower extremities  POST-OPERATIVE DIAGNOSIS:  Diabetic ulcers both lower extremities  PROCEDURE:  Procedure(s): DEBRIDEMENT WOUNDS BOTH LOWER EXTREMITIES WITH BONE BIOPSIES (Bilateral)  SURGEON:  Surgeon(s) and Role:    * Vivi Barrack, DPM - Primary  PHYSICIAN ASSISTANT:   ASSISTANTS: none   ANESTHESIA:   none  EBL:  5 mL   BLOOD ADMINISTERED:none  DRAINS: none   LOCAL MEDICATIONS USED:  OTHER 10 cc lidocaine and marcaine plain  SPECIMEN:  Source of Specimen:  bone culture bilateral   DISPOSITION OF SPECIMEN:  PATHOLOGY  COUNTS:  YES  TOURNIQUET:  * No tourniquets in log *  DICTATION: .Dragon Dictation  PLAN OF CARE: Admit to inpatient   PATIENT DISPOSITION:  PACU - hemodynamically stable.   Delay start of Pharmacological VTE agent (>24hrs) due to surgical blood loss or risk of bleeding: no  Indications for surgery: Patient was initially seen in clinic over the weekend for bilateral wounds and concern for infection.  He was sent to the emergency department with he was admitted.  Sed rate was significantly elevated although other blood work was unremarkable.  MRI was performed bilaterally which was concerning for possible osteomyelitis as well as abscesses.  We had multiple discussions regards to surgery he initially declined however after further discussion he has agreed to proceed with surgery to include wound debridements, bone biopsies bilaterally.  We discussed the surgery as well as postoperative course.  Alternatives, risks, complications were discussed.  No promises or guarantees were given effective the procedure and all questions were answered to the best my ability.  The procedure in detail: The patient was both verbally and visually identified by myself, nursing staff, the anesthesia staff preoperatively.  He was instructed to the operating room via  stretcher and placed on the operating table in supine position.  Bilateral lower extremities and scrubbed, prepped, draped in normal sterile fashion.  Timeout was performed.  Started with bone biopsies bilaterally.  3 bone biopsies were completed in total through separate incisions.  There with the left fifth metatarsal as well as the right fourth metatarsal and the right fifth metatarsal.  Small stab incisions were made along each of the metatarsals and the Jamshidi needle was utilized in order to remove the piece of bone.  Each specimen was then passed off the table in a sterile container and properly identified.  All the incisions were copiously irrigated with saline hemostasis was achieved and the skin was then closed with 3-0 Prolene.  Attention was then directed to both of the feet on the area ulcerations.  On the right fifth metatarsal head I utilized a #15 with scalpel to remove nonviable tissue down to the level of fascia.  Also there was a wound present right submetatarsal 4 as well as left fifth metatarsal base.  I utilized the misonix ultrasound debrider in order to debride the wounds in a healthy, bleeding, viable tissue.  The wounds were down to fascia.  There is no purulence or signs of abscess today.  At this time hemostasis was achieved after the wounds were actively debrided all nonviable tissue down to healthy, bleeding, viable tissue.  The wounds measured as follows.  The left lateral wound was 1.2 x 1.2 x 0.2 cm.  Right submetatarsal 4 was 0.7 x 0.6 x 0.3 cm in the right fifth metatarsal head laterally was 3 x 3 x 0.4 cm.  A mixture of  10 cc of lidocaine, Marcaine plain was infiltrated bilaterally.  Xeroform was applied followed by dry sterile dressing.  He was awoken from anesthesia and found to tolerate the procedure well and complications.  Postoperative course: Continue local wound care and await bone biopsy results and await infectious disease for antibiotic recommendations.

## 2019-10-08 LAB — AEROBIC/ANAEROBIC CULTURE W GRAM STAIN (SURGICAL/DEEP WOUND)
Gram Stain: NONE SEEN
Gram Stain: NONE SEEN
Gram Stain: NONE SEEN

## 2019-10-10 ENCOUNTER — Other Ambulatory Visit: Payer: Self-pay

## 2019-10-10 ENCOUNTER — Ambulatory Visit (INDEPENDENT_AMBULATORY_CARE_PROVIDER_SITE_OTHER): Payer: BC Managed Care – PPO | Admitting: Podiatry

## 2019-10-10 DIAGNOSIS — L97411 Non-pressure chronic ulcer of right heel and midfoot limited to breakdown of skin: Secondary | ICD-10-CM | POA: Diagnosis not present

## 2019-10-10 DIAGNOSIS — L97421 Non-pressure chronic ulcer of left heel and midfoot limited to breakdown of skin: Secondary | ICD-10-CM

## 2019-10-10 DIAGNOSIS — L97412 Non-pressure chronic ulcer of right heel and midfoot with fat layer exposed: Secondary | ICD-10-CM | POA: Diagnosis not present

## 2019-10-10 DIAGNOSIS — E11621 Type 2 diabetes mellitus with foot ulcer: Secondary | ICD-10-CM

## 2019-10-10 MED ORDER — SILVER SULFADIAZINE 1 % EX CREA
TOPICAL_CREAM | CUTANEOUS | 0 refills | Status: DC
Start: 2019-10-10 — End: 2021-10-31

## 2019-10-10 NOTE — Progress Notes (Signed)
  Subjective:  Patient ID: [name], male    DOB: 1985-11-29,  MRN: [id number]  Chief Complaint  Patient presents with  . Wound Check    DOS 10/02/2019. No pain/fever/chills/N&V/foul odor. He has worn his surgical shoes and kept the dressings dry/intact. No significantly abnormal glucose. Taking Zyvox, Levaquin, and Flagyl.    34 y.o. male presents for wound care. Hx confirmed with patient.  Objective:  Physical Exam: Lett 5th met base wound 2x1; granular base, periwound intact Right plantar 4th met wound 1x0.5; granular base, periwound intact Right lateral 5th MPJ wound 3x3; mostly granular base with minimal fibrosis, mild periwound HPK All wounds without warmth, erythema, signs of acute infection.  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 Diabetic ulcer of left midfoot associated with type 2 diabetes mellitus, limited to breakdown of skin ([place])   2. Diabetic ulcer of right midfoot associated with type 2 diabetes mellitus, limited to breakdown of skin ([place])   3. Diabetic ulcer of right midfoot associated with type 2 diabetes mellitus, with fat layer exposed ([place])      Plan:  Patient was evaluated and treated and all questions answered.  Ulcers bilateral feet, s/p bone biopsy -Continue PO abx per ID -Sutures from bone bx removed today -The wounds do not appear in need of debridement today. -Silvadene and DSD applied to wounds today. Ordered silvadene for patient to apply until wound supplies come in. -Ordered wound care supplies from [facility] including collagen dressings -F/u with Dr. Jacqualyn Posey next week for recheck  Onychomycosis, Diabetes and DPN -Patient is diabetic with a qualifying condition for at risk foot care.  Procedure: Nail Debridement Rationale: Patient meets criteria for routine foot care due to amputation hx Type of Debridement: manual, sharp debridement. Instrumentation: Nail nipper,  rotary burr. Number of Nails: 9  Return in about 1 week (around 10/17/2019).

## 2019-10-14 ENCOUNTER — Ambulatory Visit (INDEPENDENT_AMBULATORY_CARE_PROVIDER_SITE_OTHER): Payer: BC Managed Care – PPO

## 2019-10-14 ENCOUNTER — Ambulatory Visit (INDEPENDENT_AMBULATORY_CARE_PROVIDER_SITE_OTHER): Payer: BC Managed Care – PPO | Admitting: Podiatry

## 2019-10-14 ENCOUNTER — Other Ambulatory Visit: Payer: Self-pay

## 2019-10-14 DIAGNOSIS — Z22322 Carrier or suspected carrier of Methicillin resistant Staphylococcus aureus: Secondary | ICD-10-CM

## 2019-10-14 DIAGNOSIS — M79671 Pain in right foot: Secondary | ICD-10-CM | POA: Diagnosis not present

## 2019-10-14 DIAGNOSIS — M79672 Pain in left foot: Secondary | ICD-10-CM | POA: Diagnosis not present

## 2019-10-14 DIAGNOSIS — L97509 Non-pressure chronic ulcer of other part of unspecified foot with unspecified severity: Secondary | ICD-10-CM

## 2019-10-14 DIAGNOSIS — M86179 Other acute osteomyelitis, unspecified ankle and foot: Secondary | ICD-10-CM

## 2019-10-15 LAB — CBC WITH DIFFERENTIAL/PLATELET
Absolute Monocytes: 560 cells/uL (ref 200–950)
Basophils Absolute: 50 cells/uL (ref 0–200)
Basophils Relative: 0.9 %
Eosinophils Absolute: 280 cells/uL (ref 15–500)
Eosinophils Relative: 5 %
HCT: 37.7 % — ABNORMAL LOW (ref 38.5–50.0)
Hemoglobin: 11.8 g/dL — ABNORMAL LOW (ref 13.2–17.1)
Lymphs Abs: 1310 cells/uL (ref 850–3900)
MCH: 26.8 pg — ABNORMAL LOW (ref 27.0–33.0)
MCHC: 31.3 g/dL — ABNORMAL LOW (ref 32.0–36.0)
MCV: 85.7 fL (ref 80.0–100.0)
MPV: 10.6 fL (ref 7.5–12.5)
Monocytes Relative: 10 %
Neutro Abs: 3399 cells/uL (ref 1500–7800)
Neutrophils Relative %: 60.7 %
Platelets: 282 10*3/uL (ref 140–400)
RBC: 4.4 10*6/uL (ref 4.20–5.80)
RDW: 13 % (ref 11.0–15.0)
Total Lymphocyte: 23.4 %
WBC: 5.6 10*3/uL (ref 3.8–10.8)

## 2019-10-15 LAB — BASIC METABOLIC PANEL
BUN: 20 mg/dL (ref 7–25)
CO2: 30 mmol/L (ref 20–32)
Calcium: 9.5 mg/dL (ref 8.6–10.3)
Chloride: 99 mmol/L (ref 98–110)
Creat: 1 mg/dL (ref 0.60–1.35)
Glucose, Bld: 255 mg/dL — ABNORMAL HIGH (ref 65–99)
Potassium: 4.2 mmol/L (ref 3.5–5.3)
Sodium: 137 mmol/L (ref 135–146)

## 2019-10-15 LAB — SEDIMENTATION RATE: Sed Rate: 33 mm/h — ABNORMAL HIGH (ref 0–15)

## 2019-10-15 LAB — C-REACTIVE PROTEIN: CRP: 1.3 mg/L (ref <8.0)

## 2019-10-15 NOTE — Progress Notes (Signed)
Subjective: 34 year old male presents the office today for follow-up evaluation status post bilateral wound debridements, bone biopsies.  He was discharged home with oral antibiotics as he did not want to have the PICC line placed.  He is currently on Flagyl, Zyvox, Levaquin he is tolerating this well any side effects.  He states he has not had any blood work since leaving the hospital. He does have follow-up with infectious disease scheduled.  He is continue with daily dressing changes himself.  He has been keeping a collagen dressing on the foot wounds bilaterally. Denies any systemic complaints such as fevers, chills, nausea, vomiting. No acute changes since last appointment, and no other complaints at this time.   Objective: AAO x3, NAD DP/PT pulses palpable bilaterally, CRT less than 3 seconds Ulcerations continued bilaterally.  Ulceration present to the fifth metatarsal base of the left foot as well as the fifth metatarsal head laterally on the right foot.  These appear to be superficial and there is no probing to bone, undermining or tunneling.  There is mild surrounding edema but there is no erythema, ascending cellulitis.  There is no fluctuation or crepitation.  There is no malodor. No open lesions or pre-ulcerative lesions.  No pain with calf compression, swelling, warmth, erythema  Assessment: Bilateral ulcerations with osteomyelitis, MRSA  Plan: -All treatment options discussed with the patient including all alternatives, risks, complications.  -X-rays obtained reviewed.  There is no definitive cortical changes at this point.  Able to identify the area of previous bone biopsies. -We long discussion regards to amputation versus limb salvage.  He is hesitant to proceed with amputation at this point.  Recommend continue oral antibiotics for now and recommend follow-up with infectious disease.  Recheck blood work today including CBC, BMP, ESR, CRP.  Also recommend wound care center  evaluation.  A referral will be placed for this as well.  For now continue daily dressing changes.  Offloading shoes.  Elevation. -Patient encouraged to call the office with any questions, concerns, change in symptoms.   Trula Slade DPM

## 2019-10-16 ENCOUNTER — Encounter: Payer: Self-pay | Admitting: Nurse Practitioner

## 2019-10-16 ENCOUNTER — Ambulatory Visit: Payer: BC Managed Care – PPO | Admitting: Nurse Practitioner

## 2019-10-16 VITALS — BP 124/82 | HR 75 | Ht 77.0 in | Wt 280.0 lb

## 2019-10-16 DIAGNOSIS — R197 Diarrhea, unspecified: Secondary | ICD-10-CM | POA: Diagnosis not present

## 2019-10-16 DIAGNOSIS — D649 Anemia, unspecified: Secondary | ICD-10-CM | POA: Diagnosis not present

## 2019-10-16 NOTE — Patient Instructions (Addendum)
If you are age 33 or older, your body mass index should be between 23-30. Your Body mass index is 33.2 kg/m. If this is out of the aforementioned range listed, please consider follow up with your Primary Care Provider.  If you are age 69 or younger, your body mass index should be between 19-25. Your Body mass index is 33.2 kg/m. If this is out of the aformentioned range listed, please consider follow up with your Primary Care Provider.   Your provider has requested that you go to the basement level for lab work In 7-10 days. Press "B" on the elevator. The lab is located at the first door on the left as you exit the elevator.  Please purchase the following medications over the counter and take as directed: Vear Clock Bacteria probiotic 1 tablet daily  We have scheduled a follow up appointment for you to see Dr Adela Lank.  Call our office if your diarrhea occurs   Due to recent changes in healthcare laws, you may see the results of your imaging and laboratory studies on MyChart before your provider has had a chance to review them.  We understand that in some cases there may be results that are confusing or concerning to you. Not all laboratory results come back in the same time frame and the provider may be waiting for multiple results in order to interpret others.  Please give [name] 48 hours in order for your provider to thoroughly review all the results before contacting the office for clarification of your results.

## 2019-10-16 NOTE — Progress Notes (Signed)
10/16/2019 Max Chavero [id number] [birth year]   CHIEF COMPLAINT: Diarrhea  HISTORY OF PRESENT ILLNESS:  [name] is a 34 year old male with a past medical history of diabetes mellitus type 1 diagnosed 10 years ago, diabetic retinopathy, COVID-19 pneumonia 02/2019.  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   He was admitted to [facility] on 09/28/2019 - 10/04/2019 secondary to complications from a right foot wound.  He initially had a callus removed to the right foot which subsequently resulted in an infection and abscess.  He was initially treated with Doxycycline po prescribed at the urgent care clinic.  He then saw podiatrist who sent him to the emergency room for further evaluation.  He was assessed to have bilateral diabetic foot infections with possible osteomyelitis to the right foot.  He received IV antibiotics then was transitioned to oral antibiotics.  He underwent bilateral wound debriedments and bone biopsies. His wound culture was positive for MRSA.  He was discharged home in 10/04/2019 on Levofloxacin 750 mg 1 p.o. daily x28 days, Linezolid 600 mg 1 p.o. twice daily for 28 days and Metronidazole 500 mg 1 p.o. 3 times daily for 28 days.  He presents to our office today as referred by his PCP for further evaluation regarding chronic diarrhea.  He reports having 5-6 episodes of urgent watery to mud-like nonbloody diarrhea daily for the past 2 years.  At times, he wakens from sleep soiled in diarrhea.  He denies having any associated abdominal pain.  However, he is currently taking 3 antibiotics for osteomyelitis and bilateral foot infection as mentioned above. Since taking the antibiotics,  his diarrhea has resolved.  He is now passing 2-3 solid bowel movements daily.  No rectal bleeding or melena.  No weight loss.  No family history of inflammatory bowel disease or colorectal cancer. He previously had nausea and vomiting when he was taking Doxycyline po on an empty  stomach. No current N/V.   His laboratory studies during his recent hospital admission showed a normocytic anemia.  His most recent CBC was 10/14/2019 which showed a hemoglobin 11.8 and hematocrit 37.7.  He denies any history of anemia.   CBC Latest Ref Rng & Units 10/14/2019 10/03/2019 10/02/2019  WBC 3.8 - 10.8 Thousand/uL 5.6 6.1 6.2  Hemoglobin 13.2 - 17.1 g/dL 11.8(L) 10.6(L) 10.7(L)  Hematocrit 38 - 50 % 37.7(L) 34.4(L) 33.3(L)  Platelets 140 - 400 Thousand/uL 282 306 374    CTAP 08/21/2019: 1. No acute abnormality in the abdomen/pelvis. 2. Mild hepatic steatosis. 3. Mild distal colonic diverticulosis without acute inflammation.   Past Medical History:  Diagnosis Date  . Diabetes mellitus    Past Surgical History:  Procedure Laterality Date  . 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;  Surgeon: Toni Arthurs, MD;  Location: [facility];  Service: Orthopedics;  Laterality: Left;  . NO PAST SURGERIES    . WOUND DEBRIDEMENT Bilateral 10/02/2019   Procedure: DEBRIDEMENT WOUNDS BOTH LOWER EXTREMITIES WITH BONE BIOPSIES;  Surgeon: Vivi Barrack, DPM;  Location: [facility] OR;  Service: Podiatry;  Laterality: Bilateral;    Social History: Single. 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 Nonsmoker. Rare alcohol intake. No drug use.   Family History: Father's history unknown.  Mother had knee surgery. Maternal uncle DM. Sister age 28 healthy.    Allergies  Allergen Reactions  . Basaglar Stephanie Coup [Insulin Glargine] Diarrhea  . Metformin And Related Diarrhea and Nausea And  Vomiting  . Trulicity [Dulaglutide] Diarrhea      Outpatient Encounter Medications as of 10/16/2019  Medication Sig  . Continuous Blood Gluc Sensor (FREESTYLE LIBRE 14 DAY SENSOR) MISC Inject 1 patch into the skin every 14 (fourteen) days.  . Continuous Blood Gluc Sensor MISC 1 each by Does not apply route as directed. Use as directed every 14 days. May dispense FreeStyle [employer] or similar.  . Ensure Max Protein (ENSURE MAX PROTEIN) LIQD Take 330 mLs (11 oz total) by mouth 2 (two) times daily.  . insulin aspart (NOVOLOG) 100 UNIT/ML injection Inject 0-15 Units into the skin 3 (three) times daily with meals. (Patient taking differently: Inject 7-12 Units into the skin 3 (three) times daily as needed (before meals, if BGL is 250 or greater). )  . insulin glargine (LANTUS SOLOSTAR) 100 UNIT/ML Solostar Pen Inject 40 Units into the skin daily before breakfast.  . levofloxacin (LEVAQUIN) 750 MG tablet Take 1 tablet (750 mg total) by mouth daily for 28 days.  Marland Kitchen linezolid (ZYVOX) 600 MG tablet Take 1 tablet (600 mg total) by mouth 2 (two) times daily for 28 days.  . metroNIDAZOLE (FLAGYL) 500 MG tablet Take 1 tablet (500 mg total) by mouth 3 (three) times daily for 28 days.  . silver sulfADIAZINE (SILVADENE) 1 % cream Apply pea-sized amount to wound daily.   No facility-administered encounter medications on file as of 10/16/2019.     REVIEW OF SYSTEMS:   Gen: Denies fever, sweats or chills. No weight loss.  CV: Denies chest pain, palpitations or edema. Resp: Denies cough, shortness of breath of hemoptysis.  GI: Denies heartburn, dysphagia, stomach or lower abdominal pain. See HPI. GU : Denies urinary burning, blood in urine, increased urinary frequency or incontinence. MS: Denies joint pain, muscles aches or weakness. Derm: Denies rash, itchiness, skin lesions or unhealing ulcers. Psych: Denies depression, anxiety or memory loss. Heme: Denies bruising, bleeding. Neuro:  Denies headaches, dizziness or paresthesias. Endo:  Denies any problems with thyroid or adrenal function.    PHYSICAL EXAM: BP 124/82   Pulse 75   Ht 6\' 5"  (1.956 m)   Wt 280 lb (127 kg)   BMI 33.20 kg/m  General: Well developed  34 year old male in no acute distress. Head: Normocephalic and atraumatic. Eyes:  Sclerae non-icteric, conjunctive pink. Ears: Normal auditory acuity.  Mouth: Dentition intact. No ulcers or lesions.  Neck: Supple, no lymphadenopathy or thyromegaly.  Lungs: Clear bilaterally to auscultation without wheezes, crackles or rhonchi. Heart: Regular rate and rhythm. No murmur, rub or gallop appreciated.  Abdomen: Soft, nontender, non distended. No masses. No hepatosplenomegaly. Normoactive bowel sounds x 4 quadrants.  Rectal: Deferred.  Musculoskeletal: Symmetrical with no gross deformities. Skin: Warm and dry. No rash or lesions on visible extremities. Extremities: Bilateral medical shoes intact.  Neurological: Alert oriented x 4, no focal deficits.  Psychological:  Alert and cooperative. Normal mood and affect.  ASSESSMENT AND PLAN:  25. 34 year old male with chronic diarrhea x2 years which resolved after taking full antibiotics during his hospitalization -Check GI panel and C. difficile PCR if diarrhea recurred -Follow-up in our office in 6 weeks, earlier if diarrhea recurs -Bacteria probiotic of choice once daily  2. Normocytic anemia -Repeat CBC in 7 to 10 days. Iron, iron saturation, TIBC and ferritin. -Heme slides  3. DM type I with bilateral diabetic foot infections, MRSA + osteomyelitis of right foot on triple antibiotic therapy for 28 days  CC:  [name], MD

## 2019-10-17 ENCOUNTER — Telehealth: Payer: Self-pay | Admitting: *Deleted

## 2019-10-17 DIAGNOSIS — M86179 Other acute osteomyelitis, unspecified ankle and foot: Secondary | ICD-10-CM

## 2019-10-17 DIAGNOSIS — L97509 Non-pressure chronic ulcer of other part of unspecified foot with unspecified severity: Secondary | ICD-10-CM

## 2019-10-17 DIAGNOSIS — Z22322 Carrier or suspected carrier of Methicillin resistant Staphylococcus aureus: Secondary | ICD-10-CM

## 2019-10-17 DIAGNOSIS — E11621 Type 2 diabetes mellitus with foot ulcer: Secondary | ICD-10-CM

## 2019-10-17 DIAGNOSIS — L97411 Non-pressure chronic ulcer of right heel and midfoot limited to breakdown of skin: Secondary | ICD-10-CM

## 2019-10-17 DIAGNOSIS — L97421 Non-pressure chronic ulcer of left heel and midfoot limited to breakdown of skin: Secondary | ICD-10-CM

## 2019-10-17 NOTE — Progress Notes (Signed)
Agree with assessment and plan as outlined.  

## 2019-10-17 NOTE — Telephone Encounter (Signed)
-----   Message from Vivi Barrack, DPM sent at 10/15/2019  7:28 AM EDT ----- Can you please put in a referral for the wound care center? Thanks.

## 2019-10-17 NOTE — Telephone Encounter (Signed)
Faxed required form, clinicals and demographics to [facility]. 

## 2019-10-23 ENCOUNTER — Ambulatory Visit: Payer: BC Managed Care – PPO | Admitting: Internal Medicine

## 2019-10-23 ENCOUNTER — Other Ambulatory Visit: Payer: Self-pay

## 2019-10-23 ENCOUNTER — Encounter: Payer: Self-pay | Admitting: Internal Medicine

## 2019-10-23 DIAGNOSIS — E11628 Type 2 diabetes mellitus with other skin complications: Secondary | ICD-10-CM

## 2019-10-23 DIAGNOSIS — L089 Local infection of the skin and subcutaneous tissue, unspecified: Secondary | ICD-10-CM

## 2019-10-23 MED ORDER — DOXYCYCLINE HYCLATE 100 MG PO TABS: 100.0000 mg | ORAL_TABLET | Freq: Two times a day (BID) | ORAL | 1 refills | Status: DC

## 2019-10-23 NOTE — Progress Notes (Signed)
[facility]  Patient Active Problem List   Diagnosis Date Noted  . Diabetic foot infection (HCC) 09/28/2019    Priority: High  . Diabetic foot ulcer associated with type 1 diabetes mellitus (HCC)   . Leg swelling   . Cellulitis of right lower extremity   . Normocytic anemia 09/28/2019  . 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 Severe nonproliferative diabetic retinopathy of left eye, with macular edema, associated with type 1 diabetes mellitus (HCC) 07/18/2019  . Diabetic cataract (HCC) 07/18/2019  . Osteomyelitis of ankle or foot, acute, left (HCC) 03/11/2019  . Pneumonia due to severe acute respiratory syndrome coronavirus 2 (SARS-CoV-2) 02/12/2019  . Pneumonia due to COVID-19 virus 02/07/2019  . Acute respiratory failure with hypoxia (HCC) 02/07/2019  . Uncontrolled type 1 diabetes mellitus with hyperglycemia (HCC) 02/07/2019  . COVID-19 virus infection 02/04/2019  . Sepsis (HCC) 02/03/2019  . 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 AKI (acute kidney injury) (HCC) 02/03/2019  . CAP (community acquired pneumonia) 06/28/2017  . 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 Hyperglycemia without ketosis 01/19/2013    Patient's Medications  New Prescriptions   DOXYCYCLINE (VIBRA-TABS) 100 MG TABLET    Take 1 tablet (100 mg total) by mouth 2 (two) times daily.  Previous Medications   CONTINUOUS BLOOD GLUC SENSOR (FREESTYLE LIBRE 14 DAY SENSOR) MISC    Inject 1 patch into the skin every 14 (fourteen) days.   CONTINUOUS BLOOD GLUC SENSOR MISC    1 each by Does not apply route as directed. Use as directed every 14 days. May dispense FreeStyle [employer] or similar.   ENSURE MAX PROTEIN (ENSURE MAX PROTEIN) LIQD    Take 330 mLs (11 oz total) by mouth 2 (two) times daily.   INSULIN ASPART  (NOVOLOG) 100 UNIT/ML INJECTION    Inject 0-15 Units into the skin 3 (three) times daily with meals.   INSULIN GLARGINE (LANTUS SOLOSTAR) 100 UNIT/ML SOLOSTAR PEN    Inject 40 Units into the skin daily before breakfast.   LEVOFLOXACIN (LEVAQUIN) 750 MG TABLET    Take 1 tablet (750 mg total) by mouth daily for 28 days.   LINEZOLID (ZYVOX) 600 MG TABLET    Take 1 tablet (600 mg total) by mouth 2 (two) times daily for 28 days.   METRONIDAZOLE (FLAGYL) 500 MG TABLET    Take 1 tablet (500 mg total) by mouth 3 (three) times daily for 28 days.   SILVER SULFADIAZINE (SILVADENE) 1 % CREAM    Apply pea-sized amount to wound daily.  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  He had a fracture of his left foot last summer and eventually developed a plantar ulcer on the lateral aspect of his left foot.  He started going to the wound center last fall after his foot became infected.  Wound drainage cultures in November, December and January grew Serratia staph lugdunensis, and Enterococcus.  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  He seemed to be healing well.  About 3 weeks ago he began to have swelling and redness of his right foot.  Recently the  wound started to open and drain.  He was seen in urgent care on 09/26/2019 and started on doxycycline and referred to Dr. Ardelle Anton who saw him in the office 2 days later.  He drained an abscess in his left foot and directed him for admission here.  The abscess culture is grew MRSA  He has had MRIs of both feet showing:  Left foot IMPRESSION: 1. Apparent skin ulceration inferior and lateral to the 5th metatarsal base with underlying heterogeneous T2 signal and enhancement in the subcutaneous fat. Small peripherally enhancing fluid collections along the plantar and  lateral aspects of the 5th metatarsal base suspicious for abscesses. 2. Interval amputation through the mid 5th metatarsal with nonspecific low-level marrow edema and enhancement. Given the proximity to the adjacent soft tissue inflammatory changes, osteomyelitis cannot be excluded. 3. The additional bones appear unremarkable.  Right foot IMPRESSION: 1. Soft tissue ulceration lateral to the 5th MTP joint. There is low-level T2 hyperintensity within the 4th and 5th metatarsal heads and adjacent proximal phalanges without abnormal T1 signal or cortical destruction. These findings are nonspecific and could be seen with early marrow edema, hyperemia or early osteomyelitis. No evidence of septic joint. 2. Mild tenosynovitis and synovial enhancement associated with the extensor digitorum tendons at the level of the midfoot. 3. Diffuse low-level muscular T2 hyperintensity and enhancement, most consistent with diabetic myopathy.  Bone biopsies of both feet grew MRSA and methicillin susceptible staph lugdunensis.  He did not want to have a PICC placed for IV antibiotics.  He was discharged on oral linezolid, levofloxacin and metronidazole.  He has now completed 26 days of therapy.  He has a history of chronic diarrhea but says that that stopped as soon as she started on the oral antibiotics.  He feels like his feet are about the same.  Review of Systems: Review of Systems  Constitutional: Negative for chills, diaphoresis and fever.  Gastrointestinal: Negative for abdominal pain, diarrhea, nausea and vomiting.  Musculoskeletal: Negative for joint pain.    Past Medical History:  Diagnosis Date  . Diabetes mellitus     Social History   Tobacco Use  . Smoking status: Never Smoker  . Smokeless tobacco: Never Used  Vaping Use  . Vaping Use: Never used  Substance Use Topics  . Alcohol use: No  . Drug use: No    Family History  Problem Relation Age of Onset  . Hypertension Other     . Diabetes Other   . Healthy Mother   . Colon cancer Neg Hx   . Stomach cancer Neg Hx   . Pancreatic cancer Neg Hx   . Esophageal cancer Neg Hx     Allergies  Allergen Reactions  . Basaglar Stephanie Coup [Insulin Glargine] Diarrhea  . Metformin And Related Diarrhea and Nausea And Vomiting  . Trulicity [Dulaglutide] Diarrhea    Objective: Vitals:   10/23/19 0846  BP: 126/85  Pulse: (!) 101  SpO2: 98%  Weight: 282 lb (127.9 kg)   Body mass index is 33.44 kg/m.  Physical Exam Constitutional:      Comments: He is pleasant and in no distress.  Musculoskeletal:     Comments: The ulcer on his left lateral foot and plantar ulcers under his right fourth and fifth metatarsal heads are relatively unchanged.  There is minimal drainage and no malodor.         Lab Results Sed Rate  Date Value  10/14/2019 33 mm/h (H)  09/28/2019 129 mm/hr (H)  09/28/2019 105  mm/hr (H)   CRP  Date Value  10/14/2019 1.3 mg/L  09/28/2019 5.0 mg/dL (H)  82/70/7867 3.3 mg/L     Problem List Items Addressed This Visit      High   Diabetic foot infection (HCC)    His inflammatory markers are improving.  I will change linezolid to doxycycline and continue that along with 2-3 more weeks of oral levofloxacin and metronidazole.  Follow-up here in 3 weeks      Relevant Medications   doxycycline (VIBRA-TABS) 100 MG tablet   Other Relevant Orders   CBC   Basic metabolic panel   Sedimentation rate   C-reactive protein       Cliffton Asters, MD [facility] [phone] pager   [phone] cell 10/23/2019, 9:07 AM

## 2019-10-23 NOTE — Assessment & Plan Note (Signed)
His inflammatory markers are improving.  I will change linezolid to doxycycline and continue that along with 2-3 more weeks of oral levofloxacin and metronidazole.  Follow-up here in 3 weeks

## 2019-10-24 ENCOUNTER — Ambulatory Visit (INDEPENDENT_AMBULATORY_CARE_PROVIDER_SITE_OTHER): Payer: BC Managed Care – PPO | Admitting: Podiatry

## 2019-10-24 ENCOUNTER — Encounter (HOSPITAL_BASED_OUTPATIENT_CLINIC_OR_DEPARTMENT_OTHER): Payer: BC Managed Care – PPO | Attending: Internal Medicine | Admitting: Internal Medicine

## 2019-10-24 DIAGNOSIS — E11621 Type 2 diabetes mellitus with foot ulcer: Secondary | ICD-10-CM | POA: Diagnosis not present

## 2019-10-24 DIAGNOSIS — Z22322 Carrier or suspected carrier of Methicillin resistant Staphylococcus aureus: Secondary | ICD-10-CM

## 2019-10-24 DIAGNOSIS — M86572 Other chronic hematogenous osteomyelitis, left ankle and foot: Secondary | ICD-10-CM | POA: Insufficient documentation

## 2019-10-24 DIAGNOSIS — M86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, right ankle and foot: Secondary | ICD-10-CM | POA: Diagnosis not present

## 2019-10-24 DIAGNOSIS — B9562 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 classified elsewhere: Secondary | ICD-10-CM | POA: Insufficient documentation

## 2019-10-24 DIAGNOSIS — M86179 Other acute osteomyelitis, unspecified ankle and foot: Secondary | ICD-10-CM

## 2019-10-24 DIAGNOSIS — L97509 Non-pressure chronic ulcer of other part of unspecified foot with unspecified severity: Secondary | ICD-10-CM

## 2019-10-24 DIAGNOSIS — L97528 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ity: Secondary | ICD-10-CM | POA: Diagnosis not present

## 2019-10-24 DIAGNOSIS — L97518 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ity: Secondary | ICD-10-CM | POA: Diagnosis present

## 2019-10-24 LAB — BASIC METABOLIC PANEL
BUN: 14 mg/dL (ref 7–25)
CO2: 30 mmol/L (ref 20–32)
Calcium: 9.3 mg/dL (ref 8.6–10.3)
Chloride: 102 mmol/L (ref 98–110)
Creat: 0.99 mg/dL (ref 0.60–1.35)
Glucose, Bld: 280 mg/dL — ABNORMAL HIGH (ref 65–99)
Potassium: 4.6 mmol/L (ref 3.5–5.3)
Sodium: 138 mmol/L (ref 135–146)

## 2019-10-24 LAB — CBC
HCT: 36.3 % — ABNORMAL LOW (ref 38.5–50.0)
Hemoglobin: 11.3 g/dL — ABNORMAL LOW (ref 13.2–17.1)
MCH: 27 pg (ref 27.0–33.0)
MCHC: 31.1 g/dL — ABNORMAL LOW (ref 32.0–36.0)
MCV: 86.8 fL (ref 80.0–100.0)
MPV: 10.3 fL (ref 7.5–12.5)
Platelets: 172 10*3/uL (ref 140–400)
RBC: 4.18 10*6/uL — ABNORMAL LOW (ref 4.20–5.80)
RDW: 14 % (ref 11.0–15.0)
WBC: 4.1 10*3/uL (ref 3.8–10.8)

## 2019-10-24 LAB — C-REACTIVE PROTEIN: CRP: 0.6 mg/L (ref <8.0)

## 2019-10-24 LAB — SEDIMENTATION RATE: Sed Rate: 14 mm/h (ref 0–15)

## 2019-10-25 NOTE — Progress Notes (Signed)
Max, [name] ([id number]) Visit Report for 10/24/2019 Abuse/Suicide Risk Screen Details Patient Name: Date of Service: [name] 10/24/2019 9:00 A M Medical Record Number: [id number] Patient Account Number: [id number] Date of Birth/Sex: Treating RN: [birth year] (34 y.o. [name]) [name] Primary Care Jarelly Rinck: Katy Apo Other Clinician: Referring Latysha Thackston: Treating Noya Santarelli/Extender: Myna Bright Weeks in Treatment: 0 Abuse/Suicide Risk Screen Items Answer ABUSE RISK SCREEN: Has anyone close to you tried to hurt or harm you recentlyo No Do you feel uncomfortable with anyone in your familyo No Has anyone forced you do things that you didnt want to doo No Electronic Signature(s) Signed: 10/25/2019 5:42:58 PM By: Max Pax RN Entered By: [name] on 10/24/2019 09:27:16 -------------------------------------------------------------------------------- Activities of Daily Living Details Patient Name: Date of Service: [name] 10/24/2019 9:00 A M Medical Record Number: [id number] Patient Account Number: [id number] Date of Birth/Sex: Treating RN: [birth year] (34 y.o. [name]) [name] Primary Care Shalina Norfolk: Katy Apo Other Clinician: Referring Randale Carvalho: Treating Jaedyn Marrufo/Extender: Myna Bright Weeks in Treatment: 0 Activities of Daily Living Items Answer Activities of Daily Living (Please select one for each item) Drive Automobile Completely Able T Medications ake Completely Able Use T elephone Completely Able Care for Appearance Completely Able Use T oilet Completely Able Bath / Shower Completely Able Dress Self Completely Able Feed Self Completely Able Walk Completely Able Get In / Out Bed Completely Able Housework Completely Able Prepare Meals Completely Able Handle Money Completely Able Shop for Self Completely Able Electronic Signature(s) Signed: 10/25/2019 5:42:58 PM By: Max Pax RN Entered By:  [name] on 10/24/2019 09:27:55 -------------------------------------------------------------------------------- Education Screening Details Patient Name: Date of Service: [name], [name] 10/24/2019 9:00 A M Medical Record Number: [id number] Patient Account Number: [id number] Date of Birth/Sex: Treating RN: 1985/08/03 (34 y.o. [name]) [name] Primary Care Madhuri Vacca: Katy Apo Other Clinician: Referring Jahdai Padovano: Treating Yitzchak Kothari/Extender: Demetria Pore in Treatment: 0 Primary Learner Assessed: Patient Learning Preferences/Education Level/Primary Language Learning Preference: Explanation Highest Education Level: College or Above Preferred Language: English Cognitive Barrier Language Barrier: No Translator Needed: No Memory Deficit: No Emotional Barrier: No Cultural/Religious Beliefs Affecting Medical Care: No Physical Barrier Impaired Vision: No Impaired Hearing: No Decreased Hand dexterity: No Knowledge/Comprehension Knowledge Level: Medium Comprehension Level: High Ability to understand written instructions: High Ability to understand verbal instructions: High Motivation Anxiety Level: Calm Cooperation: Cooperative Education Importance: Acknowledges Need Interest in Health Problems: Asks Questions Perception: Coherent Willingness to Engage in Self-Management High Activities: Readiness to Engage in Self-Management High Activities: Electronic Signature(s) Signed: 10/25/2019 5:42:58 PM By: Max Pax RN Entered By: [name] on 10/24/2019 09:28:36 -------------------------------------------------------------------------------- Fall Risk Assessment Details Patient Name: Date of Service: Caswell Corwin NG, [name] 10/24/2019 9:00 A M Medical Record Number: [id number] Patient Account Number: [id number] Date of Birth/Sex: Treating RN: 1985-09-13 (34 y.o. [name]) [name] Primary Care Cline Draheim: Katy Apo Other  Clinician: Referring Arlene Genova: Treating Annlouise Gerety/Extender: Myna Bright Weeks in Treatment: 0 Fall Risk Assessment Items Have you had 2 or more falls in the last 12 monthso 0 No Have you had any fall that resulted in injury in the last 12 monthso 0 No FALLS RISK SCREEN History of falling - immediate or within 3 months 0 No Secondary diagnosis (Do you have 2 or more medical diagnoseso) 0 No Ambulatory aid None/bed rest/wheelchair/nurse 0 No Crutches/cane/walker 0 No Furniture 0 No Intravenous therapy Access/Saline/Heparin Lock 0 No Gait/Transferring  Normal/ bed rest/ wheelchair 0 No Weak (short steps with or without shuffle, stooped but able to lift head while walking, may seek 0 No support from furniture) Impaired (short steps with shuffle, may have difficulty arising from chair, head down, impaired 0 No balance) Mental Status Oriented to own ability 0 No Electronic Signature(s) Signed: 10/25/2019 5:42:58 PM By: Max Pax RN Entered By: [name] on 10/24/2019 09:28:43 -------------------------------------------------------------------------------- Foot Assessment Details Patient Name: Date of Service: [name], [name] 10/24/2019 9:00 A M Medical Record Number: [id number] Patient Account Number: [id number] Date of Birth/Sex: Treating RN: [birth year] (34 y.o. [name]) [name] Primary Care Trell Secrist: Katy Apo Other Clinician: Referring Elijio Staples: Treating Mac Dowdell/Extender: Myna Bright Weeks in Treatment: 0 Foot Assessment Items Site Locations + = Sensation present, - = Sensation absent, C = Callus, U = Ulcer R = Redness, W = Warmth, M = Maceration, PU = Pre-ulcerative lesion F = Fissure, S = Swelling, [name] = Dryness Assessment Right: Left: Other Deformity: No No Prior Foot Ulcer: No Yes Prior Amputation: No Yes Charcot Joint: No No Ambulatory Status: Ambulatory Without Help Gait: Steady Electronic  Signature(s) Signed: 10/25/2019 5:42:58 PM By: Max Pax RN Entered By: [name] on 10/24/2019 09:42:32 -------------------------------------------------------------------------------- Nutrition Risk Screening Details Patient Name: Date of Service: [name], [name] 10/24/2019 9:00 A M Medical Record Number: [id number] Patient Account Number: [id number] Date of Birth/Sex: Treating RN: December 30, 1985 (34 y.o. [name]) [name] Primary Care Jari Dipasquale: Katy Apo Other Clinician: Referring Trae Bovenzi: Treating Samyria Rudie/Extender: Myna Bright Weeks in Treatment: 0 Height (in): 77 Weight (lbs): 280 Body Mass Index (BMI): 33.2 Nutrition Risk Screening Items Score Screening NUTRITION RISK SCREEN: I have an illness or condition that made me change the kind and/or amount of food I eat 0 No I eat fewer than two meals per day 0 No I eat few fruits and vegetables, or milk products 0 No I have three or more drinks of beer, liquor or wine almost every day 0 No I have tooth or mouth problems that make it hard for me to eat 0 No I don't always have enough money to buy the food I need 0 No I eat alone most of the time 0 No I take three or more different prescribed or over-the-counter drugs a day 1 Yes Without wanting to, I have lost or gained 10 pounds in the last six months 0 No I am not always physically able to shop, cook and/or feed myself 0 No Nutrition Protocols Good Risk Protocol 0 No interventions needed Moderate Risk Protocol High Risk Proctocol Risk Level: Good Risk Score: 1 Electronic Signature(s) Signed: 10/25/2019 5:42:58 PM By: Max Pax RN Entered By: [name] on 10/24/2019 01:60:10

## 2019-10-25 NOTE — Progress Notes (Signed)
[place], [name] ([id number]) Visit Report for 10/24/2019 Chief Complaint Document Details Patient Name: Date of Service: [name] 10/24/2019 9:00 A M Medical Record Number: [id number] Patient Account Number: [id number] Date of Birth/Sex: Treating RN: 1985/10/20 (34 y.o. [name] Primary Care Provider: [name] Other Clinician: Referring Provider: Treating Provider/Extender: Delton See in Treatment: 0 Information Obtained from: Patient Chief Complaint 01/11/2019; patient is here for review of a rather substantial wound over the left fifth plantar metatarsal head extending into the lateral part of his foot 10/24/2019; patient returns to clinic with wounds on his bilateral feet with underlying osteomyelitis biopsy-proven Electronic Signature(s) Signed: 10/24/2019 5:29:29 PM By: [name] MD Entered By: [name] on 10/24/2019 09:43:45 -------------------------------------------------------------------------------- Debridement Details Patient Name: Date of Service: [name], [name] 10/24/2019 9:00 A M Medical Record Number: [id number] Patient Account Number: [id number] Date of Birth/Sex: Treating RN: 1985-06-13 (34 y.o. [name] Primary Care Provider: [name] Other Clinician: Referring Provider: Treating Provider/Extender: Delton See in Treatment: 0 Debridement Performed for Assessment: Wound #2 Right,Lateral Foot Performed By: Physician [name], MD Debridement Type: Debridement Severity of Tissue Pre Debridement: Fat layer exposed Level of Consciousness (Pre-procedure): Awake and Alert Pre-procedure Verification/Time Out Yes - 10:35 Taken: Start Time: 10:35 T Area Debrided (L x W): otal 2.6 (cm) x 2.7 (cm) = 7.02 (cm) Tissue and other material debrided: Non-Viable, Callus, Subcutaneous, Skin: Epidermis Level: Skin/Subcutaneous Tissue Debridement Description:  Excisional Instrument: Curette Bleeding: Minimum Hemostasis Achieved: Pressure End Time: 10:37 Procedural Pain: 0 Post Procedural Pain: 0 Response to Treatment: Procedure was tolerated well Level of Consciousness (Post- Awake and Alert procedure): Post Debridement Measurements of Total Wound Length: (cm) 2.6 Width: (cm) 2.7 Depth: (cm) 0.1 Volume: (cm) 0.551 Character of Wound/Ulcer Post Debridement: Improved Severity of Tissue Post Debridement: Fat layer exposed Post Procedure Diagnosis Same as Pre-procedure Electronic Signature(s) Signed: 10/24/2019 5:02:14 PM By: [name] RN, BSN Signed: 10/24/2019 5:29:29 PM By: [name] MD Entered By: [name] on 10/24/2019 10:54:17 -------------------------------------------------------------------------------- HPI Details Patient Name: Date of Service: [name], [name] 10/24/2019 9:00 A M Medical Record Number: [id number] Patient Account Number: [id number] Date of Birth/Sex: Treating RN: May 21, 1985 (34 y.o. 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 He comes in for review of a wound over the plantar fifth metatarsal head extending into the lateral part of the foot. He was followed for this previously by his podiatrist Dr. Cornelius Moras. As the patient tells his story he went to see podiatry first for a swelling he developed on the lateral part of his fifth metatarsal head in May. He states this was "open" by podiatry and the area closed. He was followed up in June and it was again opened callus removed and it closed promptly. There were plans being made for surgery on the fifth metatarsal head in June however his blood sugar was apparently too  high for anesthesia. Apparently the area was debrided and opened again in June and it is never closed since. Looking over the records from podiatry I am really not able to follow this. It was clear when he was first seen it was before 5/14 at that point he already had a  wound. By 5/17 the ulcer was resolved. I do not see anything about a procedure. On 5/28 noted to have pre-ulcerative moderate keratosis. X-ray noted 1/5 contracted toe and tailor's bunion and metatarsal deformity. On a visit date on 09/28/2018 the dorsal part of the left foot it healed and resolved. There was concern about swelling in his lower extremity he was sent to the ER.. As far as I can tell he was seen in the ER on 7/12 with an ulcer on his left foot. A DVT rule out of the left leg was negative. I do not think I have complete records from podiatry but I am not able to verify the procedures this patient states he had. He states after the last procedure the wound has never closed although I am not able to follow this in the records I have from podiatry. He has not had a recent x-ray The patient has been using Neosporin on the wound. He is wearing a Darco shoe. He is still very active up on his foot working and exercising. Past medical history; type 2 diabetes ketosis-prone, leg swelling with a negative DVT study in July. Non-smoker ABI in our clinic was 0.85 on the left 10/16; substantial wound on the plantar left fifth met head extending laterally almost to the dorsal fifth MTP. We have been using silver alginate we gave him a Darco forefoot off loader. An x-ray did not show evidence of osteomyelitis did note soft tissue emphysema which I think was due to gas tracking through an open wound. There is no doubt in my mind he requires an MRI 10/23; MRI not booked until 3 November at the earliest this is largely due to his glucose sensor in the right arm. We have been using silver alginate. There has been an improvement 10/29; I am  still not exactly sure when his MRI is booked for. He says it is the third but it is the 10th in epic. This definitely needs to be done. He is running a low-grade fever today but no other symptoms. No real improvement in the 1 02/26/2019 patient presents today for a follow-up visit here in our clinic he is last been seen in the clinic on October 29. Subsequently we were working on getting MRI to evaluate and see what exactly was going on and where we would need to go from the standpoint of whether or not he had osteomyelitis and again what treatments were going be required. Subsequently the patient ended up being admitted to the hospital on 02/07/2019 and was discharged on 02/14/2019. This is a somewhat interesting admission with a discharge diagnosis of pneumonia due to COVID-19 although he was positive for COVID-19 when tested at the urgent care but negative x2 when he was actually in the hospital. With that being said he did have acute respiratory failure with hypoxia and it was noted he also have a left foot ulceration with osteomyelitis. With that being said he did require oxygen for his pneumonia and I level 4 L. He was placed on antivirals and steroids for the COVID-19. He was also transferred to the [facility] at one point. Nonetheless he did subsequently discharged home and since being home has done much better in that regard. The CT angiogram did not show any pulmonary embolism. With regard to the osteomyelitis the patient was placed on vancomycin and Zosyn while in the hospital but has been changed to Augmentin at discharge. It was also recommended that he follow- up with wound  care and podiatry. Podiatry however wanted him to see [name] according to the patient prior to them doing anything further. His hemoglobin A1c was 9.9 as noted in the hospital. Have an MRI of the left foot performed while in the hospital on 02/04/2019. 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 There is an overlying plantar open wound noted an abscess tracking back along the lateral aspect of the fifth metatarsal shaft. There is otherwise diffuse cellulitis and mild fasciitis without findings of polymyositis. The patient did have recently pneumonia secondary to COVID-19 I looked in the chart through epic and it does appear that the patient may need to have an additional x-ray just to ensure everything is cleared and that he has no airspace disease prior to putting him into the chamber. 03/05/2019; patient was readmitted to the clinic last week. He was hospitalized twice for a viral upper respiratory tract infection from 11/1 through 11/4 and then 11/5 through 11/12 ultimately this turned out to be Covid pneumonitis. Although he was discharged on oxygen he is not using it. He says he feels fine. He has no exercise limitation no cough no sputum. His O2 sat in our clinic today was 100% on room air. 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 He received Vanco and Zosyn in the hospital and then was discharged on 2 weeks of Augmentin. I do not see any relevant cultures. He was supposed to follow-up with infectious disease but I do not see that he has an appointment. 12/8; patient saw Dr. Novella Olive of infectious disease last week. He felt that he had had adequate antibiotic therapy. He did not go to follow-up with Dr. Amalia Hailey of podiatry and I have again talked to him about the pros and cons of this. He does not want to consider a ray amputation of this time. He is aware of the risks of recurrence, migration etc. He started HBO today and tolerated this well. He can complete the Augmentin that I gave him last week. I have looked over the lab work that Dr. Chana Bode ordered his C-reactive protein was 3.3 and his  sedimentation rate was 17. The C-reactive protein is never really been measurably that high in this patient 12/15; not much change in the wound today however he has undermining along the lateral part of the foot again more extensively than last week. He has some rims of epithelialization. We have been using silver alginate. He is undergoing hyperbarics but did not dive today 12/18; in for his obligatory first total contact cast change. Unfortunately there was pus coming from the undermining area around his fifth metatarsal head. This was cultured but will preclude reapplication of a cast. He is seen in conjunction with HBO 12/24; patient had staph lugdunensis in the wound in the undermining area laterally last time. We put him on doxycycline which should have covered this. The wound looks better today. I am going to give him another week of doxycycline before reattempting the total contact cast 12/31; the patient is completing antibiotics. Hemorrhagic debris in the distal part of the wound with some undermining distally. He also had hyper granulation. Extensive debridement with a #5 curette. The infected area that was on the lateral part of the fifth met head is closed over. I do not think he needs any more  antibiotics. Patient was seen prior to HBO. Preparations for a total contact cast were made in the cast will be placed post hyperbarics 04/11/19; once again the patient arrives today without complaint. He had been in a cast all week noted that he had heavy drainage this week. This resulted in large raised areas of macerated tissue around the wound 1/14; wound bed looks better slightly smaller. Hydrofera Blue has been changing himself. He had a heavy drainage last week which caused a lot of maceration around the wound so I took him out of a total contact cast he says the drainage is actually better this week He is seen today in conjunction with HBO 1/21; returns to clinic. He was up in [place] for a  day or 2 attending a funeral. He comes back in with the wound larger and with a large area of exposed bone. He had osteomyelitis and septic arthritis of the fifth left metatarsal head while he was in hospital. He received IV antibiotics in the hospital for a prolonged period of time then 3 weeks of Augmentin. Subsequently I gave him 2 weeks of doxycycline for more superficial wound infection. When I saw this last week the wound was smaller the surface of the wound looks satisfactory. 1/28; patient missed hyperbarics today. Bone biopsy I did last time showed Enterococcus faecalis and Staphylococcus lugdunensis . He has a wide area of exposed bone. We are going to use silver alginate as of today. I had another ethical discussion with the patient. 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 In this situation I think the likelihood of healing this is low. Therefore I have recommended a ray amputation and with the patient's agreement I have referred him to Dr. Doran Durand. The other issue is that his compliance with hyperbarics has been minimal because of his work schedule and given his underlying decision I am going to stop this today READMISSION 10/24/2019 MRI 09/29/2019 left foot IMPRESSION: 1. Apparent skin ulceration inferior and lateral to the 5th metatarsal base with underlying heterogeneous T2 signal and enhancement in the subcutaneous fat. Small peripherally enhancing fluid collections along the plantar and lateral aspects of the 5th metatarsal base suspicious for abscesses. 2. Interval amputation through the mid 5th metatarsal with nonspecific low-level marrow edema and enhancement. Given the proximity to the adjacent soft tissue inflammatory changes, osteomyelitis cannot be excluded. 3. The additional bones appear unremarkable. MRI 09/29/2019 right foot IMPRESSION: 1. Soft tissue ulceration lateral to the 5th MTP joint. There is low-level T2 hyperintensity  within the 4th and 5th metatarsal heads and adjacent proximal phalanges without abnormal T1 signal or cortical destruction. These findings are nonspecific and could be seen with early marrow edema, hyperemia or early osteomyelitis. No evidence of septic joint. 2. Mild tenosynovitis and synovial enhancement associated with the extensor digitorum tendons at the level of the midfoot. 3. Diffuse low-level muscular T2 hyperintensity and enhancement, most consistent with diabetic myopathy. LEFT FOOT BONE Methicillin resistant staphylococcus aureus Staphylococcus lugdunensis MIC MIC CIPROFLOXACIN >=8 RESISTANT Resistant <=0.5 SENSI... Sensitive CLINDAMYCIN <=0.25 SENS... Sensitive >=8 RESISTANT Resistant ERYTHROMYCIN >=8 RESISTANT Resistant >=8 RESISTANT Resistant GENTAMICIN <=0.5 SENSI... Sensitive <=0.5 SENSI... Sensitive Inducible Clindamycin NEGATIVE Sensitive NEGATIVE Sensitive OXACILLIN >=4 RESISTANT Resistant 2 SENSITIVE Sensitive RIFAMPIN <=0.5 SENSI... Sensitive <=0.5 SENSI... Sensitive TETRACYCLINE <=1 SENSITIVE Sensitive <=1 SENSITIVE Sensitive TRIMETH/SULFA <=10 SENSIT Sensitive <=10 SENSIT Sensitive ... Marland Kitchen.. VANCOMYCIN 1 SENSITIVE Sensitive <=0.5 SENSI... Sensitive Right foot bone . Component 3 wk ago Specimen Description BONE Special Requests  RIGHT 4 METATARSAL SAMPLE B Gram Stain NO WBC SEEN NO ORGANISMS SEEN Culture RARE METHICILLIN RESISTANT STAPHYLOCOCCUS AUREUS NO ANAEROBES ISOLATED Performed at [facility], [place] [address], [place], [place] [address] Report Status 10/08/2019 FINAL Organism ID, Bacteria METHICILLIN RESISTANT STAPHYLOCOCCUS AUREUS Resulting Agency [facility] Susceptibility Methicillin resistant staphylococcus aureus MIC CIPROFLOXACIN >=8 RESISTANT Resistant CLINDAMYCIN <=0.25 SENS... Sensitive ERYTHROMYCIN >=8 RESISTANT Resistant GENTAMICIN <=0.5 SENSI... Sensitive Inducible Clindamycin NEGATIVE Sensitive OXACILLIN >=4 RESISTANT  Resistant RIFAMPIN <=0.5 SENSI... Sensitive TETRACYCLINE <=1 SENSITIVE Sensitive TRIMETH/SULFA <=10 SENSIT Sensitive ... VANCOMYCIN 1 SENSITIVE Sensitive This is a patient we had in clinic earlier this year with a wound over his left fifth metatarsal head. 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 Doran Durand on 05/16/2019. He seems to have developed problems with wounds on his bilateral feet in June 2021 although he may have had problems earlier than this. He was in an urgent care with a right foot ulcer on 09/26/2019 and given a course of doxycycline. This was apparently after having trouble getting into see orthopedics. He was seen by podiatry on 09/28/2019 noted to have bilateral lower extremity ulcers including the left lateral fifth metatarsal base and the right subfifth met head. It was noted that had purulent drainage at that time. He required hospitalization from 6/20 through 7/2. This was because of worsening right foot wounds. He underwent bilateral operative incision and drainage and bone biopsies bilaterally. Culture results are listed above. He has been referred back to clinic by Dr. Jacqualyn Posey of podiatry. He is also followed by Dr. Megan Salon who saw him yesterday. He was discharged from hospital on Zyvox Flagyl and Levaquin and yesterday changed to doxycycline Flagyl and Levaquin. His inflammatory markers on 6/26 showed a sedimentation rate of 129 and a C-reactive protein of 5. This is improved to 14 and 1.3 respectively. This would indicate improvement. ABIs in our clinic today were 1.23 on the right and 1.20 on the left Electronic Signature(s) Signed: 10/24/2019 5:29:29 PM By: [name] MD Entered By:  [name] on 10/24/2019 10:48:42 -------------------------------------------------------------------------------- Physical Exam Details Patient Name: Date of Service: [name], [name] 10/24/2019 9:00 A M Medical Record Number: [id number] Patient Account Number: [id number] Date of Birth/Sex: Treating RN: Nov 25, 1985 (33 y.o. [name] Primary Care Provider: [name] Other Clinician: Referring Provider: Treating Provider/Extender: Elayne Snare Weeks in Treatment: 0 Constitutional Sitting or standing Blood Pressure is within target range for patient.. Pulse regular and within target range for patient.Marland Kitchen Respirations regular, non-labored and within target range.. Temperature is normal and within the target range for the patient.Marland Kitchen Appears in no distress. Respiratory work of breathing is normal. Cardiovascular Pedal pulses palpable and strong bilaterally.Marland Kitchen Psychiatric appears at normal baseline. Notes Wound exam; On the left foot he has an area on the lateral part of foot at the base of the fifth metatarsal. The wound is small circular with generally reasonable looking tissue. No evidence of infection On the right foot this is on the lateral part of the fifth metatarsal head. I used a #5 curette to remove thick skin and subcutaneous tissue around the circumference and some debris on the surface there is no exposed bone. I did not think anything needed  culturing. Both wound surfaces look exceedingly dry. Nothing required culturing Electronic Signature(s) Signed: 10/24/2019 5:29:29 PM By: [name] MD Entered By: [name] on 10/24/2019 10:50:34 -------------------------------------------------------------------------------- Physician Orders Details Patient Name: Date of Service: [name], [name] 10/24/2019 9:00 A M Medical Record Number: [id number] Patient Account Number: [id number] Date of Birth/Sex: Treating RN: [birth year] (34 y.o.  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 - Friday Return A Dressing Change Frequency Wound #2 Right,Lateral Foot Change Dressing every other day. Wound #3 Left,Lateral Foot Change Dressing every other day. Skin Barriers/Peri-Wound Care Moisturizing lotion - both legs and feet Wound Cleansing May shower and wash wound with soap and water. - on days that dressing is changed Primary Wound Dressing Wound #2 Right,Lateral Foot Silver Collagen - moisten with hydrogel or KY jelly Wound #3 Left,Lateral Foot Silver Collagen - moisten with hydrogel or KY jelly Secondary Dressing Wound #2 Right,Lateral Foot Foam - foam donut Kerlix/Rolled Gauze Dry Gauze Wound #3 Left,Lateral Foot Foam - foam donut Kerlix/Rolled Gauze Dry Gauze Off-Loading Open toe surgical shoe to: - both feet Electronic Signature(s) Signed: 10/24/2019 5:02:14 PM By: [name] RN, BSN Signed: 10/24/2019 5:29:29 PM By: [name] MD Entered By: [name] on 10/24/2019 10:44:21 -------------------------------------------------------------------------------- Problem List Details Patient Name: Date of Service: [name], [name] 10/24/2019 9:00 A M Medical Record Number:  [id number] Patient Account Number: [id number] Date of Birth/Sex: Treating RN: 1985/12/18 (34 y.o. 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, left ankle and foot 10/24/2019 No Yes B95.62 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 10/24/2019 No Yes classified elsewhere Inactive Problems Resolved Problems Electronic Signature(s) Signed: 10/24/2019 5:29:29 PM By: [name] MD Entered By: [name] on 10/24/2019 09:43:00 -------------------------------------------------------------------------------- Progress Note Details Patient Name: Date of Service: [name], [name] 10/24/2019 9:00 A M Medical Record Number: [id number] Patient Account Number: [id number] Date of Birth/Sex: Treating RN: 1985-09-16 (34 y.o. 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 He comes in for review of a wound over the plantar fifth metatarsal head extending into the lateral part of the foot. He was followed for this previously by his podiatrist Dr. Cornelius Moras. As the patient tells his story he went to see podiatry first for a swelling he developed on the lateral part of his fifth metatarsal head in May. He states this was "open" by podiatry and the area closed. He was followed up in June and it was again opened callus removed and it closed promptly. There were plans being made for surgery on the fifth metatarsal head in June however his blood sugar was apparently too high for anesthesia. Apparently the area was debrided and opened again in June and it is never closed since. Looking over the records from podiatry I am really not able to follow this. It was clear when he was first seen it was before 5/14 at that point he already had a wound. By 5/17 the ulcer was resolved. I do not see anything about a procedure. On 5/28 noted to have pre-ulcerative moderate keratosis. X-ray noted 1/5 contracted toe and tailor's bunion and metatarsal deformity. On a visit date on 09/28/2018 the dorsal part of the left foot it healed and resolved. There was concern about swelling in his lower extremity he was sent to the ER.. As far as I can tell he was seen in the ER on 7/12 with an ulcer on his left foot. A DVT rule out of the left leg was negative. I do not think I have complete records from podiatry but I am not able to verify the procedures this patient states he had. He states after the last procedure the wound has never closed although I am not able to follow this in the records I have from podiatry. He has not had a recent x-ray The patient has been using Neosporin on the wound. He is wearing a Darco shoe. He is still very active up on his foot working and  exercising. Past medical history; type 2 diabetes ketosis-prone, leg swelling with a negative DVT study in July. Non-smoker ABI in our clinic was 0.85 on the left 10/16; substantial wound on the plantar left fifth met head extending laterally almost to the dorsal fifth MTP. We have been using silver alginate we gave him a Darco forefoot off loader. An x-ray did not show evidence of osteomyelitis did note soft tissue emphysema which I think was due to gas tracking through an open wound. There is no doubt in my mind he requires an MRI 10/23; MRI not booked until 3 November at the earliest this is largely due to his glucose sensor in the right arm. We have been using silver alginate. There has been an improvement 10/29; I am still not exactly sure when his MRI is booked for. He says it is the third but it is the 10th in epic. This definitely needs to be done. He is running a low-grade fever today but no other symptoms. No real improvement in the 1 02/26/2019 patient presents today for a follow-up visit here in our clinic he is last been seen in the clinic on October 29. Subsequently we were working on getting MRI to evaluate and see what exactly was going on and where we would need to go from the standpoint of whether or not he had osteomyelitis and again what treatments were going be required. Subsequently  the patient ended up being admitted to the hospital on 02/07/2019 and was discharged on 02/14/2019. This is a somewhat interesting admission with a discharge diagnosis of pneumonia due to COVID-19 although he was positive for COVID-19 when tested at the urgent care but negative x2 when he was actually in the hospital. With that being said he did have acute respiratory failure with hypoxia and it was noted he also have a left foot ulceration with osteomyelitis. With that being said he did require oxygen for his pneumonia and I level 4 L. He was placed on antivirals and steroids for the COVID-19. He was  also transferred to the [place] at one point. Nonetheless he did subsequently discharged home and since being home has done much better in that regard. The CT angiogram did not show any pulmonary embolism. With regard to the osteomyelitis the patient was placed on vancomycin and Zosyn while in the hospital but has been changed to Augmentin at discharge. It was also recommended that he follow- up with wound care and podiatry. Podiatry however wanted him to see [name] according to the patient prior to them doing anything further. His hemoglobin A1c was 9.9 as noted in the hospital. Have an MRI of the left foot performed while in the hospital on 02/04/2019. 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 There is an overlying plantar open wound noted an abscess tracking back along the lateral aspect of the fifth metatarsal shaft. There is otherwise diffuse cellulitis and mild fasciitis without findings of polymyositis. The patient did have recently pneumonia secondary to COVID-19 I looked in the chart through epic and it does appear that the patient may need to have an additional x-ray just to ensure everything is cleared and that he has no airspace disease prior to putting him into the chamber. 03/05/2019; patient was readmitted to the clinic last week. He was hospitalized twice for a viral upper respiratory tract infection from 11/1 through 11/4 and then 11/5 through 11/12 ultimately this turned out to be Covid pneumonitis. Although he was discharged on oxygen he is not using it. He says he feels fine. He has no exercise limitation no cough no sputum. His O2 sat in our clinic today was 100% on room air. 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 He received Vanco and Zosyn in the hospital and  then was discharged on 2 weeks of Augmentin. I do not see any relevant cultures. He was supposed to follow-up with infectious disease but I do not see that he has an appointment. 12/8; patient saw Dr. Novella Olive of infectious disease last week. He felt that he had had adequate antibiotic therapy. He did not go to follow-up with Dr. Amalia Hailey of podiatry and I have again talked to him about the pros and cons of this. He does not want to consider a ray amputation of this time. He is aware of the risks of recurrence, migration etc. He started HBO today and tolerated this well. He can complete the Augmentin that I gave him last week. I have looked over the lab work that Dr. Chana Bode ordered his C-reactive protein was 3.3 and his sedimentation rate was 17. The C-reactive protein is never really been measurably that high in this patient 12/15; not much change in the wound today however he has  undermining along the lateral part of the foot again more extensively than last week. He has some rims of epithelialization. We have been using silver alginate. He is undergoing hyperbarics but did not dive today 12/18; in for his obligatory first total contact cast change. Unfortunately there was pus coming from the undermining area around his fifth metatarsal head. This was cultured but will preclude reapplication of a cast. He is seen in conjunction with HBO 12/24; patient had staph lugdunensis in the wound in the undermining area laterally last time. We put him on doxycycline which should have covered this. The wound looks better today. I am going to give him another week of doxycycline before reattempting the total contact cast 12/31; the patient is completing antibiotics. Hemorrhagic debris in the distal part of the wound with some undermining distally. He also had hyper granulation. Extensive debridement with a #5 curette. The infected area that was on the lateral part of the fifth met head is closed over. I do not think  he needs any more antibiotics. Patient was seen prior to HBO. Preparations for a total contact cast were made in the cast will be placed post hyperbarics 04/11/19; once again the patient arrives today without complaint. He had been in a cast all week noted that he had heavy drainage this week. This resulted in large raised areas of macerated tissue around the wound 1/14; wound bed looks better slightly smaller. Hydrofera Blue has been changing himself. He had a heavy drainage last week which caused a lot of maceration around the wound so I took him out of a total contact cast he says the drainage is actually better this week He is seen today in conjunction with HBO 1/21; returns to clinic. He was up in [place] for a day or 2 attending a funeral. He comes back in with the wound larger and with a large area of exposed bone. He had osteomyelitis and septic arthritis of the fifth left metatarsal head while he was in hospital. He received IV antibiotics in the hospital for a prolonged period of time then 3 weeks of Augmentin. Subsequently I gave him 2 weeks of doxycycline for more superficial wound infection. When I saw this last week the wound was smaller the surface of the wound looks satisfactory. 1/28; patient missed hyperbarics today. Bone biopsy I did last time showed Enterococcus faecalis and Staphylococcus lugdunensis . He has a wide area of exposed bone. We are going to use silver alginate as of today. I had another ethical discussion with the patient. 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 In this situation I think the likelihood of healing this is low. Therefore I have recommended a ray amputation and with the patient's agreement I have referred him to Dr. Doran Durand. The other issue is that his compliance with hyperbarics has been minimal because of his work schedule and given his underlying decision I am going to stop this today READMISSION 10/24/2019 MRI  09/29/2019 left foot IMPRESSION: 1. Apparent skin ulceration inferior and lateral to the 5th metatarsal base with underlying heterogeneous T2 signal and enhancement in the subcutaneous fat. Small peripherally enhancing fluid collections along the plantar and lateral aspects of the 5th metatarsal base suspicious for abscesses. 2. Interval amputation through the mid 5th metatarsal with nonspecific low-level marrow edema and enhancement. Given the proximity to the adjacent soft tissue inflammatory changes, osteomyelitis cannot be excluded. 3. The additional bones appear unremarkable. MRI 09/29/2019 right foot IMPRESSION: 1. Soft tissue  ulceration lateral to the 5th MTP joint. There is low-level T2 hyperintensity within the 4th and 5th metatarsal heads and adjacent proximal phalanges without abnormal T1 signal or cortical destruction. These findings are nonspecific and could be seen with early marrow edema, hyperemia or early osteomyelitis. No evidence of septic joint. 2. Mild tenosynovitis and synovial enhancement associated with the extensor digitorum tendons at the level of the midfoot. 3. Diffuse low-level muscular T2 hyperintensity and enhancement, most consistent with diabetic myopathy. LEFT FOOT BONE Methicillin resistant staphylococcus aureus Staphylococcus lugdunensis MIC MIC CIPROFLOXACIN >=8 RESISTANT Resistant <=0.5 SENSI... Sensitive CLINDAMYCIN <=0.25 SENS... Sensitive >=8 RESISTANT Resistant ERYTHROMYCIN >=8 RESISTANT Resistant >=8 RESISTANT Resistant GENTAMICIN <=0.5 SENSI... Sensitive <=0.5 SENSI... Sensitive Inducible Clindamycin NEGATIVE Sensitive NEGATIVE Sensitive OXACILLIN >=4 RESISTANT Resistant 2 SENSITIVE Sensitive RIFAMPIN <=0.5 SENSI... Sensitive <=0.5 SENSI... Sensitive TETRACYCLINE <=1 SENSITIVE Sensitive <=1 SENSITIVE Sensitive TRIMETH/SULFA <=10 SENSIT Sensitive <=10 SENSIT Sensitive ... Marland Kitchen.. VANCOMYCIN 1 SENSITIVE Sensitive <=0.5 SENSI... Sensitive Right  foot bone . Component 3 wk ago Specimen Description BONE Special Requests RIGHT 4 METATARSAL SAMPLE B Gram Stain NO WBC SEEN NO ORGANISMS SEEN Culture RARE METHICILLIN RESISTANT STAPHYLOCOCCUS AUREUS NO ANAEROBES ISOLATED Performed at [facility], [place] [address] [place], [place] [address] Report Status 10/08/2019 FINAL Organism ID, Bacteria METHICILLIN RESISTANT STAPHYLOCOCCUS AUREUS Resulting Agency [facility] Susceptibility Methicillin resistant staphylococcus aureus MIC CIPROFLOXACIN >=8 RESISTANT Resistant CLINDAMYCIN <=0.25 SENS... Sensitive ERYTHROMYCIN >=8 RESISTANT Resistant GENTAMICIN <=0.5 SENSI... Sensitive Inducible Clindamycin NEGATIVE Sensitive OXACILLIN >=4 RESISTANT Resistant RIFAMPIN <=0.5 SENSI... Sensitive TETRACYCLINE <=1 SENSITIVE Sensitive TRIMETH/SULFA <=10 SENSIT Sensitive ... VANCOMYCIN 1 SENSITIVE Sensitive This is a patient we had in clinic earlier this year with a wound over his left fifth metatarsal head. 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 Doran Durand on 05/16/2019. He seems to have developed problems with wounds on his bilateral feet in June 2021 although he may have had problems earlier than this. He was in an urgent care with a right foot ulcer on 09/26/2019 and given a course of doxycycline. This was apparently after having trouble getting into see orthopedics. He was seen by podiatry on 09/28/2019 noted to have bilateral lower extremity ulcers including the left lateral fifth metatarsal base and the right subfifth met head. It was noted that had purulent drainage at that time. He required hospitalization from 6/20 through 7/2. This was because of worsening right  foot wounds. He underwent bilateral operative incision and drainage and bone biopsies bilaterally. Culture results are listed above. He has been referred back to clinic by Dr. Jacqualyn Posey of podiatry. He is also followed by Dr. Megan Salon who saw him yesterday. He was discharged from hospital on Zyvox Flagyl and Levaquin and yesterday changed to doxycycline Flagyl and Levaquin. His inflammatory markers on 6/26 showed a sedimentation rate of 129 and a C-reactive protein of 5. This is improved to 14 and 1.3 respectively. This would indicate improvement. ABIs in our clinic today were 1.23 on the right and 1.20 on the left Patient History Information obtained from Patient. Allergies metformin Family History No family history of Cancer, Diabetes, Hereditary Spherocytosis, Hypertension, Kidney Disease, Lung Disease, Seizures, Stroke, Thyroid Problems, Tuberculosis. Social History Never smoker, Marital Status - Single, Alcohol Use - Rarely, Drug Use -  No History, Caffeine Use - Never. Medical History Eyes Denies history of Cataracts, Glaucoma, Optic Neuritis Ear/Nose/Mouth/Throat Denies history of Chronic sinus problems/congestion, Middle ear problems Hematologic/Lymphatic Denies history of Anemia, Hemophilia, Human Immunodeficiency Virus, Lymphedema, Sickle Cell Disease Respiratory Denies history of Aspiration, Asthma, Chronic Obstructive Pulmonary Disease (COPD), Pneumothorax, Sleep Apnea, Tuberculosis Cardiovascular Denies history of Angina, Arrhythmia, Congestive Heart Failure, Coronary Artery Disease, Deep Vein Thrombosis, Hypertension, Hypotension, Myocardial Infarction, Peripheral Arterial Disease, Peripheral Venous Disease, Phlebitis, Vasculitis Gastrointestinal Denies history of Cirrhosis , Colitis, Crohnoos, Hepatitis A, Hepatitis B, Hepatitis C Endocrine Patient has history of Type II Diabetes Denies history of Type I Diabetes Immunological Denies history of Lupus Erythematosus,  Raynaudoos, Scleroderma Integumentary (Skin) Denies history of History of Burn Musculoskeletal Denies history of Gout, Rheumatoid Arthritis, Osteoarthritis, Osteomyelitis Neurologic Denies history of Dementia, Neuropathy, Quadriplegia, Paraplegia, Seizure Disorder Oncologic Denies history of Received Chemotherapy, Received Radiation Psychiatric Denies history of Anorexia/bulimia, Confinement Anxiety Hospitalization/Surgery History - 11/1-11/06/2018- sepsis foot infection. - 11/4-11/5 02 sats low respiratory distress. Review of Systems (ROS) Constitutional Symptoms (General Health) Denies complaints or symptoms of Fatigue, Fever, Chills, Marked Weight Change. Eyes Denies complaints or symptoms of Dry Eyes, Vision Changes, Glasses / Contacts. Ear/Nose/Mouth/Throat Denies complaints or symptoms of Chronic sinus problems or rhinitis. Respiratory Denies complaints or symptoms of Chronic or frequent coughs, Shortness of Breath. Cardiovascular Denies complaints or symptoms of Chest pain. Gastrointestinal Denies complaints or symptoms of Frequent diarrhea, Nausea, Vomiting. Endocrine Denies complaints or symptoms of Heat/cold intolerance. Genitourinary Denies complaints or symptoms of Frequent urination. Integumentary (Skin) Complains or has symptoms of Wounds. Musculoskeletal Denies complaints or symptoms of Muscle Pain, Muscle Weakness. Neurologic Denies complaints or symptoms of Numbness/parasthesias. Psychiatric Denies complaints or symptoms of Claustrophobia, Suicidal. Objective Constitutional Sitting or standing Blood Pressure is within target range for patient.. Pulse regular and within target range for patient.Marland Kitchen Respirations regular, non-labored and within target range.. Temperature is normal and within the target range for the patient.Marland Kitchen Appears in no distress. Vitals Time Taken: 9:24 AM, Height: 77 in, Source: Stated, Weight: 280 lbs, Source: Stated, BMI: 33.2, Temperature:  98.3 F, Pulse: 105 bpm, Respiratory Rate: 18 breaths/min, Blood Pressure: 115/82 mmHg. Respiratory work of breathing is normal. Cardiovascular Pedal pulses palpable and strong bilaterally.Marland Kitchen Psychiatric appears at normal baseline. General Notes: Wound exam; ooOn the left foot he has an area on the lateral part of foot at the base of the fifth metatarsal. The wound is small circular with generally reasonable looking tissue. No evidence of infection ooOn the right foot this is on the lateral part of the fifth metatarsal head. I used a #5 curette to remove thick skin and subcutaneous tissue around the circumference and some debris on the surface there is no exposed bone. I did not think anything needed culturing. ooBoth wound surfaces look exceedingly dry. Nothing required culturing Integumentary (Hair, Skin) Wound #2 status is Open. Original cause of wound was Gradually Appeared. The wound is located on the Right,Lateral Foot. The wound measures 2.6cm length x 2.7cm width x 0.1cm depth; 5.513cm^2 area and 0.551cm^3 volume. There is Fat Layer (Subcutaneous Tissue) Exposed exposed. There is no tunneling or undermining noted. There is a medium amount of serosanguineous drainage noted. There is medium (34-66%) red, pink granulation within the wound bed. There is a medium (34-66%) amount of necrotic tissue within the wound bed including Adherent Slough. Wound #3 status is Open. Original cause of wound was Trauma. The wound is located on the Left,Lateral Foot. The wound measures 1.5cm length  x 1.4cm width x 0.1cm depth; 1.649cm^2 area and 0.165cm^3 volume. There is Fat Layer (Subcutaneous Tissue) Exposed exposed. There is no tunneling or undermining noted. There is a medium amount of serosanguineous drainage noted. The wound margin is flat and intact. There is large (67-100%) pink granulation within the wound bed. There is a small (1-33%) amount of necrotic tissue within the wound bed including  Adherent Slough. 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 There was a Excisional Skin/Subcutaneous Tissue Debridement with a total area of 7.02 sq cm performed by [name], MD. With the following instrument(s): Curette to remove Non-Viable tissue/material. Material removed includes Callus, Subcutaneous Tissue, and Skin: Epidermis. No specimens were taken. A time out was conducted at 10:35, prior to the start of the procedure. A Minimum amount of bleeding was controlled with Pressure. The procedure was tolerated well with a pain level of 0 throughout and a pain level of 0 following the procedure. Post Debridement Measurements: 2.6cm length x 2.7cm width x 0.1cm depth; 0.551cm^3 volume. Character of Wound/Ulcer Post Debridement is improved. Severity of Tissue Post Debridement is: Fat layer exposed. Post procedure Diagnosis Wound #2: Same as Pre-Procedure Plan Follow-up Appointments: Return Appointment in 1 week. - Friday Dressing Change Frequency: Wound #2 Right,Lateral Foot: Change Dressing every other day. Wound #3 Left,Lateral Foot: Change Dressing every other day. Skin Barriers/Peri-Wound Care: Moisturizing lotion - both legs and feet Wound Cleansing: May shower and wash wound with soap and water. - on days that dressing is changed Primary Wound Dressing: Wound #2  Right,Lateral Foot: Silver Collagen - moisten with hydrogel or KY jelly Wound #3 Left,Lateral Foot: Silver Collagen - moisten with hydrogel or KY jelly Secondary Dressing: Wound #2 Right,Lateral Foot: Foam - foam donut Kerlix/Rolled Gauze Dry Gauze Wound #3 Left,Lateral Foot: Foam - foam donut Kerlix/Rolled Gauze Dry Gauze Off-Loading: Open toe surgical shoe to: - both feet 1. I would like to change the primary dressing here to moistened silver collagen. We suggested K-Y jelly to moisturize. Border foam cover 2. I wondered looking at his feet whether he tends to excellent rotate enough to walk on the outside of his feet. He is using surgical shoes for now I think this is a reasonable choice. 3. He is following with Dr. Megan Salon of infectious disease for the underlying osteomyelitis bilaterally. He had intraoperative cultures which are noted in the HPI section of this note. Currently he is taking doxycycline, Levaquin and Flagyl. There has been improvement in his inflammatory markers 4. I do not believe he has a significant arterial issue although at some point formal arterial studies may be in order 5. Currently I think these neuropathic wounds secondary to his diabetes would respond to offloading. I spent over 35 minutes in review of this patient's past medical history, face-to-face evaluation and preparation of this record Electronic Signature(s) Signed: 10/24/2019 10:54:48 AM By: [name] MD Entered By: [name] on 10/24/2019 10:54:47 -------------------------------------------------------------------------------- HxROS Details Patient Name: Date of Service: [name],  [name] 10/24/2019 9:00 A M Medical Record Number: [id number] Patient Account Number: [id number] Date of Birth/Sex: Treating RN: Jan 20, 1986 (34 y.o. Jerilynn Mages) Carlene Coria Primary Care Provider: [name] Other Clinician: Referring Provider: Treating Provider/Extender: Delton See in Treatment: 0 Information Obtained From Patient Constitutional Symptoms (General Health) Complaints and Symptoms: Negative for: Fatigue; Fever; Chills; Marked Weight Change Eyes Complaints and Symptoms: Negative for: Dry Eyes; Vision Changes; Glasses / Contacts Medical History: Negative for: Cataracts; Glaucoma; Optic Neuritis Ear/Nose/Mouth/Throat Complaints and Symptoms: Negative for: Chronic sinus problems or rhinitis Medical History: Negative for: Chronic sinus problems/congestion; Middle ear problems Respiratory Complaints and Symptoms: Negative for: Chronic or frequent coughs; Shortness of Breath Medical History: Negative for: Aspiration; Asthma; Chronic Obstructive Pulmonary Disease (COPD); Pneumothorax; Sleep Apnea; Tuberculosis Cardiovascular Complaints and Symptoms: Negative for: Chest pain Medical History: Negative for: Angina; Arrhythmia; Congestive Heart Failure; Coronary Artery Disease; Deep Vein Thrombosis; Hypertension; Hypotension; Myocardial Infarction; Peripheral Arterial Disease; Peripheral Venous Disease; Phlebitis; Vasculitis Gastrointestinal Complaints and Symptoms: Negative for: Frequent diarrhea; Nausea; Vomiting Medical History: Negative for: Cirrhosis ; Colitis; Crohns; Hepatitis A; Hepatitis B; Hepatitis C Endocrine Complaints and Symptoms: Negative for: Heat/cold intolerance Medical History: Positive for: Type II Diabetes Negative for: Type I Diabetes Time with diabetes: 8 Treated with: Insulin Blood sugar tested every day: No Genitourinary Complaints and Symptoms: Negative for: Frequent urination Integumentary (Skin) Complaints and Symptoms: Positive for: Wounds Medical History: Negative for: History of Burn Musculoskeletal Complaints and Symptoms: Negative for: Muscle Pain; Muscle Weakness Medical History: Negative for: Gout; Rheumatoid Arthritis; Osteoarthritis; Osteomyelitis Neurologic Complaints and  Symptoms: Negative for: Numbness/parasthesias Medical History: Negative for: Dementia; Neuropathy; Quadriplegia; Paraplegia; Seizure Disorder Psychiatric Complaints and Symptoms: Negative for: Claustrophobia; Suicidal Medical History: Negative for: Anorexia/bulimia; Confinement Anxiety Hematologic/Lymphatic Medical History: Negative for: Anemia; Hemophilia; Human Immunodeficiency Virus; Lymphedema; Sickle Cell Disease Immunological Medical History: Negative for: Lupus Erythematosus; Raynauds; Scleroderma Oncologic Medical History: Negative for: Received Chemotherapy; Received Radiation Immunizations Pneumococcal Vaccine: Received Pneumococcal Vaccination: No Implantable Devices None Hospitalization / Surgery History Type of Hospitalization/Surgery 11/1-11/06/2018- sepsis foot infection 11/4-11/5 02 sats low respiratory distress Family and Social History Cancer: No; Diabetes: No; Hereditary Spherocytosis: No; Hypertension: No; Kidney Disease: No; Lung Disease: No; Seizures: No; Stroke: No; Thyroid Problems: No; Tuberculosis: No; Never smoker; Marital Status - Single; Alcohol Use: Rarely; Drug Use: No History; Caffeine Use: Never; Financial Concerns: No; Food, Clothing or Shelter Needs: No; Support System Lacking: No; Transportation Concerns: No Electronic Signature(s) Signed: 10/24/2019 5:29:29 PM By: [name] MD Signed: 10/25/2019 5:42:58 PM By: Carlene Coria RN Entered By: Carlene Coria on 10/24/2019 09:27:04 -------------------------------------------------------------------------------- SuperBill Details Patient Name: Date of Service: [name], [name] 10/24/2019 Medical Record Number: [id number] Patient Account Number: [id number] Date of Birth/Sex: Treating RN: 1985-10-05 (34 y.o. 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, left ankle and foot B95.62 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 classified elsewhere Facility Procedures CPT4 Code: [id number] Description: [place] VISIT-LEV 3 EST PT Modifier: 25 Quantity: 1 CPT4 Code: [id number] Description: [place]  TISSUE 20 SQ CM/< ICD-10 Diagnosis Description L97.518 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 Modifier: ity Quantity: 1 Physician Procedures : CPT4 Code Description Modifier [id number] 99214 - WC PHYS LEVEL 4 - EST PT 25 ICD-10 Diagnosis Description E11.621 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

## 2019-10-25 NOTE — Progress Notes (Signed)
[place], [name] ([id number]) Visit Report for 10/24/2019 Allergy List Details Patient Name: Date of Service: [name] D 10/24/2019 9:00 A M Medical Record Number: [id number] Patient Account Number: [id number] Date of Birth/Sex: Treating RN: 08-11-1985 (34 y.o. [name]) Yevonne Pax Primary Care Elaijah Munoz: Katy Apo Other Clinician: Referring Aleksi Brummet: Treating Serah Nicoletti/Extender: Myna Bright Weeks in Treatment: 0 Allergies Active Allergies metformin Allergy Notes Electronic Signature(s) Signed: 10/25/2019 5:42:58 PM By: Yevonne Pax RN Entered By: Yevonne Pax on 10/24/2019 09:25:39 -------------------------------------------------------------------------------- Arrival Information Details Patient Name: Date of Service: Caswell Corwin NG, [name] 10/24/2019 9:00 A M Medical Record Number: [id number] Patient Account Number: [id number] Date of Birth/Sex: Treating RN: 1985-05-23 (34 y.o. [name]) Yevonne Pax Primary Care Eran Windish: Katy Apo Other Clinician: Referring Lauraine Crespo: Treating Merlen Gurry/Extender: Demetria Pore in Treatment: 0 Visit Information Patient Arrived: Ambulatory Arrival Time: 09:21 Accompanied By: self Transfer Assistance: None Patient Identification Verified: Yes Secondary Verification Process Completed: Yes Patient Requires Transmission-Based Precautions: No Patient Has Alerts: No History Since Last Visit All ordered tests and consults were completed: No Added or deleted any medications: No Any new allergies or adverse reactions: No Had a fall or experienced change in activities of daily living that may affect risk of falls: No Signs or symptoms of abuse/neglect since No last visito Hospitalized since last visit: No Implantable device outside of the clinic excluding cellular tissue based products placed in the center since last visit: No Has Dressing in Place as Prescribed: Yes Has Footwear/Offloading in Place as  Prescribed: Yes Right: Surgical Shoe with Pressure Relief Insole Electronic Signature(s) Signed: 10/25/2019 5:42:58 PM By: Yevonne Pax RN Entered By: Yevonne Pax on 10/24/2019 09:24:35 -------------------------------------------------------------------------------- Clinic Level of Care Assessment Details Patient Name: Date of Service: [name] D 10/24/2019 9:00 A M Medical Record Number: [id number] Patient Account Number: [id number] Date of Birth/Sex: Treating RN: [birth year] (34 y.o. [name] Primary Care Javante Nilsson: Katy Apo Other Clinician: Referring Oceane Fosse: Treating Lodie Waheed/Extender: Demetria Pore in Treatment: 0 Clinic Level of Care Assessment Items TOOL 1 Quantity Score X- 1 0 Use when EandM and Procedure is performed on INITIAL visit ASSESSMENTS - Nursing Assessment / Reassessment X- 1 20 General Physical Exam (combine w/ comprehensive assessment (listed just below) when performed on new pt. evals) X- 1 25 Comprehensive Assessment (HX, ROS, Risk Assessments, Wounds Hx, etc.) ASSESSMENTS - Wound and Skin Assessment / Reassessment  - 0 Dermatologic / Skin Assessment (not related to wound area) ASSESSMENTS - Ostomy and/or Continence Assessment and Care  - 0 Incontinence Assessment and Management  - 0 Ostomy Care Assessment and Management (repouching, etc.) PROCESS - Coordination of Care X - Simple Patient / Family Education for ongoing care 1 15  - 0 Complex (extensive) Patient / Family Education for ongoing care X- 1 10 Staff obtains Chiropractor, Records, T Results / Process Orders est  - 0 Staff telephones HHA, Nursing Homes / Clarify orders / etc  - 0 Routine Transfer to another Facility (non-emergent condition)  - 0 Routine Hospital Admission (non-emergent condition) X- 1 15 New Admissions / Manufacturing engineer / Ordering NPWT Apligraf, etc. ,  - 0 Emergency Hospital Admission (emergent  condition) PROCESS - Special Needs  - 0 Pediatric / Minor Patient Management  - 0 Isolation Patient Management  - 0 Hearing / Language / Visual special needs  - 0 Assessment of Community assistance (transportation, D/C planning, etc.)  - 0 Additional assistance / Altered  mentation []  - 0 Support Surface(s) Assessment (bed, cushion, seat, etc.) INTERVENTIONS - Miscellaneous []  - 0 External ear exam []  - 0 Patient Transfer (multiple staff / / Similar devices) []  - 0 Simple Staple / Suture removal (25 or less) []  - 0 Complex Staple / Suture removal (26 or more) []  - 0 Hypo/Hyperglycemic Management (do not check if billed separately) X- 1 15 Ankle / Brachial Index (ABI) - do not check if billed separately Has the patient been seen at the hospital within the last three years: Yes Total Score: 100 Level Of Care: New/Established - Level 3 Electronic Signature(s) Signed: 10/24/2019 5:02:14 PM By: RN, BSN Entered By: Nurse, adult on 10/24/2019 11:50:01 -------------------------------------------------------------------------------- Encounter Discharge Information Details Patient Name: Date of Service: , [name] 10/24/2019 9:00 A M Medical Record Number: 10/26/2019 Patient Account Number: Zandra Abts Date of Birth/Sex: Treating RN: Jun 29, 1985 (34 y.o. [name] Primary Care Daphnie Venturini: 10/26/2019 Other Clinician: Referring Allaina Brotzman: Treating Ronson Hagins/Extender: [id number] in Treatment: 0 Encounter Discharge Information Items Post Procedure Vitals Discharge Condition: Stable Temperature (F): 98.3 Ambulatory Status: Ambulatory Pulse (bpm): 105 Discharge Destination: Home Respiratory Rate (breaths/min): 18 Transportation: Private Auto Blood Pressure (mmHg): 115/82 Accompanied By: self Schedule Follow-up Appointment: Yes Clinical Summary of Care: Patient Declined Electronic  Signature(s) Signed: 10/24/2019 4:56:58 PM By: 09/27/1985 RN, BSN Entered By: 32 on 10/24/2019 11:04:39 -------------------------------------------------------------------------------- Lower Extremity Assessment Details Patient Name: Date of Service: Katy Apo, [name] 10/24/2019 9:00 A M Medical Record Number: 10/26/2019 Patient Account Number: Zenaida Deed Date of Birth/Sex: Treating RN: [birth year] (34 y.o. [name] Primary Care Yedidya Duddy: 10/26/2019 Other Clinician: Referring Muhannad Bignell: Treating Chana Lindstrom/Extender: [id number] Weeks in Treatment: 0 Edema Assessment Assessed: [Left: No] [Right: No] E[Left: dema] [Right: :] Calf Left: Right: Point of Measurement: 48 cm From Medial Instep 45 cm 44 cm Ankle Left: Right: Point of Measurement: 10 cm From Medial Instep 28 cm 32 cm Vascular Assessment Blood Pressure: Brachial: [Left:115] [Right:115] Ankle: [Left:Dorsalis Pedis: 138 1.20] [Right:Dorsalis Pedis: 142 1.23] Electronic Signature(s) Signed: 10/25/2019 5:42:58 PM By: 09/27/1985 RN Entered By: 04-08-2005 on 10/24/2019 09:49:58 -------------------------------------------------------------------------------- Multi Wound Chart Details Patient Name: Date of Service: Katy Apo, [name] 10/24/2019 9:00 A M Medical Record Number: 10/27/2019 Patient Account Number: Yevonne Pax Date of Birth/Sex: Treating RN: [birth year] (34 y.o. [name] Primary Care Ysidra Sopher: 10/26/2019 Other Clinician: Referring Taryll Reichenberger: Treating Ridhima Golberg/Extender: [id number] in Treatment: 0 Vital Signs Height(in): 77 Pulse(bpm): 105 Weight(lbs): 280 Blood Pressure(mmHg): 115/82 Body Mass Index(BMI): 33 Temperature(F): 98.3 Respiratory Rate(breaths/min): 18 Photos: [2:No Photos Right, Lateral Foot] [3:No Photos Left, Lateral Foot] [N/A:N/A N/A] Wound Location: [2:Gradually Appeared] [3:Trauma]  [N/A:N/A] Wounding Event: [2:Diabetic Wound/Ulcer of the Lower] [3:Diabetic Wound/Ulcer of the Lower] [N/A:N/A] Primary Etiology: [2:Extremity Type II Diabetes] [3:Extremity Type II Diabetes] [N/A:N/A] Comorbid History: [2:09/03/2019] [3:10/02/2019] [N/A:N/A] Date Acquired: [2:0] [3:0] [N/A:N/A] Weeks of Treatment: [2:Open] [3:Open] [N/A:N/A] Wound Status: [2:2.6x2.7x0.1] [3:1.5x1.4x0.1] [N/A:N/A] Measurements L x W x D (cm) [2:5.513] [3:1.649] [N/A:N/A] A (cm) : rea [2:0.551] [3:0.165] [N/A:N/A] Volume (cm) : [2:Grade 2] [3:Grade 2] [N/A:N/A] Classification: [2:Medium] [3:Medium] [N/A:N/A] Exudate A mount: [2:Serosanguineous] [3:Serosanguineous] [N/A:N/A] Exudate Type: [2:red, brown] [3:red, brown] [N/A:N/A] Exudate Color: [2:N/A] [3:Flat and Intact] [N/A:N/A] Wound Margin: [2:Medium (34-66%)] [3:Large (67-100%)] [N/A:N/A] Granulation A mount: [2:Red, Pink] [3:Pink] [N/A:N/A] Granulation Quality: [2:Medium (34-66%)] [3:Small (1-33%)] [N/A:N/A] Necrotic A mount: [2:Fat Layer (Subcutaneous  Tissue)] [3:Fat Layer (Subcutaneous Tissue)] [N/A:N/A] Exposed Structures: [2:Exposed: Yes Fascia: No Tendon: No Muscle: No Joint: No Bone: No None] [3:Exposed: Yes Fascia: No Tendon: No Muscle: No Joint: No Bone: No None] [N/A:N/A] Epithelialization: [2:Debridement - Selective/Open Wound] [3:N/A] [N/A:N/A] Debridement: Pre-procedure Verification/Time Out 10:35 [3:N/A] [N/A:N/A] Taken: [2:Callus] [3:N/A] [N/A:N/A] Tissue Debrided: [2:Non-Viable Tissue] [3:N/A] [N/A:N/A] Level: [2:7.02] [3:N/A] [N/A:N/A] Debridement A (sq cm): [2:rea Curette] [3:N/A] [N/A:N/A] Instrument: [2:Minimum] [3:N/A] [N/A:N/A] Bleeding: [2:Pressure] [3:N/A] [N/A:N/A] Hemostasis A chieved: [2:0] [3:N/A] [N/A:N/A] Procedural Pain: [2:0] [3:N/A] [N/A:N/A] Post Procedural Pain: [2:Procedure was tolerated well] [3:N/A] [N/A:N/A] Debridement Treatment Response: [2:2.6x2.7x0.1] [3:N/A] [N/A:N/A] Post Debridement Measurements L x W  x D (cm) [2:0.551] [3:N/A] [N/A:N/A] Post Debridement Volume: (cm) [2:Debridement] [3:N/A] [N/A:N/A] Treatment Notes Electronic Signature(s) Signed: 10/24/2019 5:02:14 PM By: Zandra AbtsLynch, Shatara RN, BSN Signed: 10/24/2019 5:29:29 PM By: Baltazar Najjarobson, Michael MD Entered By: Baltazar Najjarobson, Michael on 10/24/2019 10:54:00 -------------------------------------------------------------------------------- Multi-Disciplinary Care Plan Details Patient Name: Date of Service: Caswell CorwinA RMSTRO NG, [name] 10/24/2019 9:00 A M Medical Record Number: [id number] Patient Account Number: [id number] Date of Birth/Sex: Treating RN: [birth year] (34 y.o. Elizebeth KollerM) Lynch, Shatara Primary Care Kalis Friese: Katy ApoPolite, Ronald D Other Clinician: Referring Azul Brumett: Treating Alecea Trego/Extender: Demetria Poreobson, Michael [name], Ronald D Weeks in Treatment: 0 Active Inactive Nutrition Nursing Diagnoses: Imbalanced nutrition Potential for alteratiion in Nutrition/Potential for imbalanced nutrition Goals: Patient/caregiver agrees to and verbalizes understanding of need to use nutritional supplements and/or vitamins as prescribed Date Initiated: 10/24/2019 Target Resolution Date: 11/22/2019 Goal Status: Active Patient/caregiver will maintain therapeutic glucose control Date Initiated: 10/24/2019 Target Resolution Date: 11/22/2019 Goal Status: Active Interventions: Assess HgA1c results as ordered upon admission and as needed Assess patient nutrition upon admission and as needed per policy Provide education on elevated blood sugars and impact on wound healing Provide education on nutrition Notes: Wound/Skin Impairment Nursing Diagnoses: Impaired tissue integrity Knowledge deficit related to ulceration/compromised skin integrity Goals: Patient/caregiver will verbalize understanding of skin care regimen Date Initiated: 10/24/2019 Target Resolution Date: 11/22/2019 Goal Status: Active Ulcer/skin breakdown will have a volume reduction of 30% by week 4 Date  Initiated: 10/24/2019 Target Resolution Date: 11/22/2019 Goal Status: Active Interventions: Assess patient/caregiver ability to obtain necessary supplies Assess patient/caregiver ability to perform ulcer/skin care regimen upon admission and as needed Assess ulceration(s) every visit Provide education on ulcer and skin care Notes: Electronic Signature(s) Signed: 10/24/2019 5:02:14 PM By: Zandra AbtsLynch, Shatara RN, BSN Entered By: Zandra AbtsLynch, Shatara on 10/24/2019 11:49:18 -------------------------------------------------------------------------------- Pain Assessment Details Patient Name: Date of Service: [name], [name] 10/24/2019 9:00 A M Medical Record Number: [id number] Patient Account Number: [id number] Date of Birth/Sex: Treating RN: [birth year] (34 y.o. [name]) Epps, Carrie Primary Care Legacy Lacivita: Katy ApoPolite, Ronald D Other Clinician: Referring Stpehen Petitjean: Treating Dishawn Bhargava/Extender: Myna Brightobson, Michael [name], Ronald D Weeks in Treatment: 0 Active Problems Location of Pain Severity and Description of Pain Patient Has Paino No Site Locations Pain Management and Medication Current Pain Management: Electronic Signature(s) Signed: 10/25/2019 5:42:58 PM By: Yevonne PaxEpps, Carrie RN Entered By: Yevonne PaxEpps, Carrie on 10/24/2019 09:56:19 -------------------------------------------------------------------------------- Patient/Caregiver Education Details Patient Name: Date of Service: [name], [name] 7/22/2021andnbsp9:00 A M Medical Record Number: [id number] Patient Account Number: [id number] Date of Birth/Gender: Treating RN: [birth year] (34 y.o. Elizebeth KollerM) Lynch, Shatara Primary Care Physician: Katy ApoPolite, Ronald D Other Clinician: Referring Physician: Treating Physician/Extender: Demetria Poreobson, Michael [name], Ronald D Weeks in Treatment: 0 Education Assessment Education Provided To: Patient Education Topics Provided Elevated Blood Sugar/ Impact on Healing: Methods: Explain/Verbal Responses: State content  correctly Nutrition: Methods: Explain/Verbal Responses: State content correctly Offloading: Methods:  Explain/Verbal Responses: State content correctly Wound/Skin Impairment: Methods: Explain/Verbal Responses: State content correctly Electronic Signature(s) Signed: 10/24/2019 5:02:14 PM By: Zandra Abts RN, BSN Entered By: Zandra Abts on 10/24/2019 11:49:36 -------------------------------------------------------------------------------- Wound Assessment Details Patient Name: Date of Service: [name], [name] 10/24/2019 9:00 A M Medical Record Number: [id number] Patient Account Number: [id number] Date of Birth/Sex: Treating RN: 03-Oct-1985 (34 y.o. [name]) Yevonne Pax Primary Care Chevy Sweigert: Katy Apo Other Clinician: Referring Aqeel Norgaard: Treating Khamille Beynon/Extender: Myna Bright Weeks in Treatment: 0 Wound Status Wound Number: 2 Primary Etiology: Diabetic Wound/Ulcer of the Lower Extremity Wound Location: Right, Lateral Foot Wound Status: Open Wounding Event: Gradually Appeared Comorbid History: Type II Diabetes Date Acquired: 09/03/2019 Weeks Of Treatment: 0 Clustered Wound: No Photos Photo Uploaded By: [name] on 10/25/2019 10:42:25 Wound Measurements Length: (cm) 2.6 Width: (cm) 2.7 Depth: (cm) 0.1 Area: (cm) 5.513 Volume: (cm) 0.551 % Reduction in Area: % Reduction in Volume: Epithelialization: None Tunneling: No Undermining: No Wound Description Classification: Grade 2 Exudate Amount: Medium Exudate Type: Serosanguineous Exudate Color: red, brown Foul Odor After Cleansing: No Slough/Fibrino Yes Wound Bed Granulation Amount: Medium (34-66%) Exposed Structure Granulation Quality: Red, Pink Fascia Exposed: No Necrotic Amount: Medium (34-66%) Fat Layer (Subcutaneous Tissue) Exposed: Yes Necrotic Quality: Adherent Slough Tendon Exposed: No Muscle Exposed: No Joint Exposed: No Bone Exposed: No Treatment Notes Wound #2  (Right, Lateral Foot) 2. Periwound Care Moisturizing lotion 3. Primary Dressing Applied Collegen AG Hydrogel or K-Y Jelly 4. Secondary Dressing Dry Gauze Roll Gauze Foam 7. Footwear/Offloading device applied Surgical shoe Electronic Signature(s) Signed: 10/25/2019 5:42:58 PM By: Yevonne Pax RN Entered By: Yevonne Pax on 10/24/2019 09:54:14 -------------------------------------------------------------------------------- Wound Assessment Details Patient Name: Date of Service: [name], [name] 10/24/2019 9:00 A M Medical Record Number: [id number] Patient Account Number: [id number] Date of Birth/Sex: Treating RN: 1986/01/08 (34 y.o. [name]) Yevonne Pax Primary Care Robynn Marcel: Katy Apo Other Clinician: Referring Eulogia Dismore: Treating Donovan Gatchel/Extender: Myna Bright Weeks in Treatment: 0 Wound Status Wound Number: 3 Primary Etiology: Diabetic Wound/Ulcer of the Lower Extremity Wound Location: Left, Lateral Foot Wound Status: Open Wounding Event: Trauma Comorbid History: Type II Diabetes Date Acquired: 10/02/2019 Weeks Of Treatment: 0 Clustered Wound: No Photos Photo Uploaded By: [name] on 10/25/2019 10:42:26 Wound Measurements Length: (cm) 1.5 Width: (cm) 1.4 Depth: (cm) 0.1 Area: (cm) 1.649 Volume: (cm) 0.165 % Reduction in Area: % Reduction in Volume: Epithelialization: None Tunneling: No Undermining: No Wound Description Classification: Grade 2 Wound Margin: Flat and Intact Exudate Amount: Medium Exudate Type: Serosanguineous Exudate Color: red, brown Foul Odor After Cleansing: No Slough/Fibrino Yes Wound Bed Granulation Amount: Large (67-100%) Exposed Structure Granulation Quality: Pink Fascia Exposed: No Necrotic Amount: Small (1-33%) Fat Layer (Subcutaneous Tissue) Exposed: Yes Necrotic Quality: Adherent Slough Tendon Exposed: No Muscle Exposed: No Joint Exposed: No Bone Exposed: No Treatment Notes Wound #3 (Left,  Lateral Foot) 2. Periwound Care Moisturizing lotion 3. Primary Dressing Applied Collegen AG Hydrogel or K-Y Jelly 4. Secondary Dressing Dry Gauze Roll Gauze Foam 7. Footwear/Offloading device applied Surgical shoe Electronic Signature(s) Signed: 10/25/2019 5:42:58 PM By: Yevonne Pax RN Entered By: Yevonne Pax on 10/24/2019 09:56:07 -------------------------------------------------------------------------------- Vitals Details Patient Name: Date of Service: [name], [name] 10/24/2019 9:00 A M Medical Record Number: [id number] Patient Account Number: [id number] Date of Birth/Sex: Treating RN: March 10, 1986 (34 y.o. [name] Primary Care Cordarrel Stiefel: Katy Apo Other Clinician: Referring Jakhia Buxton: Treating Zophia Marrone/Extender: Demetria Pore in Treatment:  0 Vital Signs Time Taken: 09:24 Temperature (F): 98.3 Height (in): 77 Pulse (bpm): 105 Source: Stated Respiratory Rate (breaths/min): 18 Weight (lbs): 280 Blood Pressure (mmHg): 115/82 Source: Stated Reference Range: 80 - 120 mg / dl Body Mass Index (BMI): 33.2 Electronic Signature(s) Signed: 10/25/2019 5:42:58 PM By: Yevonne Pax RN Entered By: Yevonne Pax on 10/24/2019 09:25:18

## 2019-10-28 NOTE — Progress Notes (Signed)
Subjective: 34 year old male presents the office today for follow-up evaluation status post bilateral wound debridements, bone biopsies.  He was discharged home with oral antibiotics as he did not want to have the PICC line placed.  Since last time he is followed up with infectious disease as well as wound care center.  He actually just went to the wound care center before this appointment the dressings were just changed.  He has a follow-up to see wound care next week as well. Denies any systemic complaints such as fevers, chills, nausea, vomiting. No acute changes since last appointment, and no other complaints at this time.   Objective: AAO x3, NAD DP/PT pulses palpable bilaterally, CRT less than 3 seconds Today with the dressings intact as they were just changed for the wound care center.  He has a follow-up to the wound care center next week. No pain with calf compression, swelling, warmth, erythema  Assessment: Bilateral ulcerations with osteomyelitis, MRSA  Plan: -All treatment options discussed with the patient including all alternatives, risks, complications.  -Blood work is much improved.  This time will defer the treatment of the wound to the wound care center.  Continue to follow with infectious disease.  I will see him back for diabetic check in 3 months or sooner if any issues are to arise.  In the meantime encouraged to call with any questions or changes.  Return in about 3 months (around 01/24/2020).  Vivi Barrack DPM

## 2019-11-01 ENCOUNTER — Encounter (HOSPITAL_BASED_OUTPATIENT_CLINIC_OR_DEPARTMENT_OTHER): Payer: BC Managed Care – PPO | Admitting: Physician Assistant

## 2019-11-01 ENCOUNTER — Other Ambulatory Visit: Payer: Self-pay

## 2019-11-01 DIAGNOSIS — E11621 Type 2 diabetes mellitus with foot ulcer: Secondary | ICD-10-CM | POA: Diagnosis not present

## 2019-11-01 NOTE — Progress Notes (Addendum)
[name] ([id number]) Visit Report for 11/01/2019 Chief Complaint Document Details Patient Name: Date of Service: [name] 11/01/2019 2:30 PM Medical Record Number: [id number] Patient Account Number: [id number] Date of Birth/Sex: Treating RN: 1985/10/21 (34 y.o. [name] Primary Care Provider: Kandice [name] Other Clinician: Referring Provider: Treating Provider/Extender: [name] in Treatment: 1 Information Obtained from: Patient Chief Complaint 01/11/2019; patient is here for review of a rather substantial wound over the left fifth plantar metatarsal head extending into the lateral part of his foot 10/24/2019; patient returns to clinic with wounds on his bilateral feet with underlying osteomyelitis biopsy-proven Electronic Signature(s) Signed: 11/01/2019 2:22:38 PM By: Worthy Keeler PA-C Entered By: Worthy Keeler on 11/01/2019 14:22:37 -------------------------------------------------------------------------------- Debridement Details Patient Name: Date of Service: [name] 11/01/2019 2:30 PM Medical Record Number: [id number] Patient Account Number: [id number] Date of Birth/Sex: Treating RN: 07/06/1985 (34 y.o. [name] Primary Care Provider: Kandice [name] Other Clinician: Referring Provider: Treating Provider/Extender: [name] in Treatment: 1 Debridement Performed for Assessment: Wound #2 Right,Lateral Foot Performed By: Physician Worthy Keeler, PA Debridement Type: Debridement Severity of Tissue Pre Debridement: Fat layer exposed Level of Consciousness (Pre-procedure): Awake and Alert Pre-procedure Verification/Time Out Yes - 15:45 Taken: Start Time: 15:45 Pain Control: Other : benzocaine, 20% T Area Debrided (L x W): otal 2.5 (cm) x 2.2 (cm) = 5.5 (cm) Tissue and other material debrided: Viable, Non-Viable, Slough, Subcutaneous, Slough Level:  Skin/Subcutaneous Tissue Debridement Description: Excisional Instrument: Curette Bleeding: Minimum Hemostasis Achieved: Pressure End Time: 15:46 Procedural Pain: 0 Post Procedural Pain: 0 Response to Treatment: Procedure was tolerated well Level of Consciousness (Post- Awake and Alert procedure): Post Debridement Measurements of Total Wound Length: (cm) 2.5 Width: (cm) 2.2 Depth: (cm) 0.1 Volume: (cm) 0.432 Character of Wound/Ulcer Post Debridement: Improved Severity of Tissue Post Debridement: Fat layer exposed Post Procedure Diagnosis Same as Pre-procedure Electronic Signature(s) Signed: 11/01/2019 5:02:39 PM By: Kela Millin Signed: 11/01/2019 5:19:16 PM By: Worthy Keeler PA-C Entered By: Kela Millin on 11/01/2019 15:46:08 -------------------------------------------------------------------------------- Debridement Details Patient Name: Date of Service: [name] 11/01/2019 2:30 PM Medical Record Number: [id number] Patient Account Number: [id number] Date of Birth/Sex: Treating RN: 1985-11-07 (34 y.o. [name] Primary Care Provider: Kandice [name] Other Clinician: Referring Provider: Treating Provider/Extender: [name] in Treatment: 1 Debridement Performed for Assessment: Wound #3 Left,Lateral Foot Performed By: Physician Worthy Keeler, PA Debridement Type: Debridement Severity of Tissue Pre Debridement: Fat layer exposed Level of Consciousness (Pre-procedure): Awake and Alert Pre-procedure Verification/Time Out Yes - 15:45 Taken: Start Time: 15:45 Pain Control: Other : benzocaine, 20% T Area Debrided (L x W): otal 1 (cm) x 1.2 (cm) = 1.2 (cm) Tissue and other material debrided: Viable, Non-Viable, Slough, Subcutaneous, Slough Level: Skin/Subcutaneous Tissue Debridement Description: Excisional Instrument: Curette Bleeding: Minimum Hemostasis Achieved: Pressure End Time: 15:46 Procedural Pain:  0 Post Procedural Pain: 0 Response to Treatment: Procedure was tolerated well Level of Consciousness (Post- Awake and Alert procedure): Post Debridement Measurements of Total Wound Length: (cm) 1 Width: (cm) 1.2 Depth: (cm) 0.1 Volume: (cm) 0.094 Character of Wound/Ulcer Post Debridement: Improved Severity of Tissue Post Debridement: Fat layer exposed Post Procedure Diagnosis Same as Pre-procedure Electronic Signature(s) Signed: 11/01/2019 5:02:39 PM By: Kela Millin Signed: 11/01/2019 5:19:16 PM By: Worthy Keeler PA-C Entered By: Kela Millin on 11/01/2019 15:46:41 -------------------------------------------------------------------------------- HPI Details  Patient Name: Date of Service: [name] 11/01/2019 2:30 PM Medical Record Number: [id number] Patient Account Number: [id number] Date of Birth/Sex: Treating RN: 1985-07-20 (34 y.o. 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 He comes in for review of a wound over the plantar fifth metatarsal head extending into the lateral part of the foot. He was followed for this previously by his podiatrist Dr. Cornelius Moras. As the patient tells his story he went to see podiatry first for a swelling he developed on the lateral part of his fifth metatarsal head in May. He states this was "open" by podiatry and the area closed. He was followed up in June and it was again opened callus removed and it closed promptly. There were plans being made for surgery on the fifth metatarsal head in June however his blood sugar was apparently too high for anesthesia. Apparently the area was debrided and opened again in June and it is never closed since. Looking  over the records from podiatry I am really not able to follow this. It was clear when he was first seen it was before 5/14 at that point he already had a wound. By 5/17 the ulcer was resolved. I do not see anything about a procedure. On 5/28 noted to have pre-ulcerative moderate keratosis. X-ray noted 1/5 contracted toe and tailor's bunion and metatarsal deformity. On a visit date on 09/28/2018 the dorsal part of the left foot it healed and resolved. There was concern about swelling in his lower extremity he was sent to the ER.. As far as I can tell he was seen in the ER on 7/12 with an ulcer on his left foot. A DVT rule out of the left leg was negative. I do not think I have complete records from podiatry but I am not able to verify the procedures this patient states he had. He states after the last procedure the wound has never closed although I am not able to follow this in the records I have from podiatry. He has not had a recent x-ray The patient has been using Neosporin on the wound. He is wearing a Darco shoe. He is still very active up on his foot working and exercising. Past medical history; type 2 diabetes ketosis-prone, leg swelling with a negative DVT study in July. Non-smoker ABI in our clinic was 0.85 on the left 10/16; substantial wound on the plantar left fifth met head extending laterally almost to the dorsal fifth MTP. We have been using silver alginate we gave him a Darco forefoot off loader. An x-ray did not show evidence of osteomyelitis did note soft tissue emphysema which I think was due to gas tracking through an open wound. There is no doubt in my mind he requires an MRI 10/23; MRI not booked until 3 November at the earliest this is largely due to his glucose sensor in the right arm. We have been using silver alginate. There has been an improvement 10/29; I am still not exactly sure when his MRI is booked for. He says it is the third but it is the 10th in epic. This definitely  needs to be done. He is running a low-grade fever today but no other symptoms. No real improvement in the 1 02/26/2019 patient  presents today for a follow-up visit here in our clinic he is last been seen in the clinic on October 29. Subsequently we were working on getting MRI to evaluate and see what exactly was going on and where we would need to go from the standpoint of whether or not he had osteomyelitis and again what treatments were going be required. Subsequently the patient ended up being admitted to the hospital on 02/07/2019 and was discharged on 02/14/2019. This is a somewhat interesting admission with a discharge diagnosis of pneumonia due to COVID-19 although he was positive for COVID-19 when tested at the urgent care but negative x2 when he was actually in the hospital. With that being said he did have acute respiratory failure with hypoxia and it was noted he also have a left foot ulceration with osteomyelitis. With that being said he did require oxygen for his pneumonia and I level 4 L. He was placed on antivirals and steroids for the COVID-19. He was also transferred to the [facility] at one point. Nonetheless he did subsequently discharged home and since being home has done much better in that regard. The CT angiogram did not show any pulmonary embolism. With regard to the osteomyelitis the patient was placed on vancomycin and Zosyn while in the hospital but has been changed to Augmentin at discharge. It was also recommended that he follow- up with wound care and podiatry. Podiatry however wanted him to see [name] according to the patient prior to them doing anything further. His hemoglobin A1c was 9.9 as noted in the hospital. Have an MRI of the left foot performed while in the hospital on 02/04/2019. 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 There is an overlying plantar open wound  noted an abscess tracking back along the lateral aspect of the fifth metatarsal shaft. There is otherwise diffuse cellulitis and mild fasciitis without findings of polymyositis. The patient did have recently pneumonia secondary to COVID-19 I looked in the chart through epic and it does appear that the patient may need to have an additional x-ray just to ensure everything is cleared and that he has no airspace disease prior to putting him into the chamber. 03/05/2019; patient was readmitted to the clinic last week. He was hospitalized twice for a viral upper respiratory tract infection from 11/1 through 11/4 and then 11/5 through 11/12 ultimately this turned out to be Covid pneumonitis. Although he was discharged on oxygen he is not using it. He says he feels fine. He has no exercise limitation no cough no sputum. His O2 sat in our clinic today was 100% on room air. 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 He received Vanco and Zosyn in the hospital and then was discharged on 2 weeks of Augmentin. I do not see any relevant cultures. He was supposed to follow-up with infectious disease but I do not see that he has an appointment. 12/8; patient saw Dr. Novella Olive of infectious disease last week. He felt that he had had adequate antibiotic therapy. He did not go to follow-up with Dr. Amalia Hailey of podiatry and I have again talked to him about the pros and cons of this. He does not want to consider a ray amputation of this time. He is aware of the risks of recurrence, migration etc. He started HBO  today and tolerated this well. He can complete the Augmentin that I gave him last week. I have looked over the lab work that Dr. Chana Bode ordered his C-reactive protein was 3.3 and his sedimentation rate was 17. The C-reactive protein is never really been measurably that high in this patient 12/15; not much  change in the wound today however he has undermining along the lateral part of the foot again more extensively than last week. He has some rims of epithelialization. We have been using silver alginate. He is undergoing hyperbarics but did not dive today 12/18; in for his obligatory first total contact cast change. Unfortunately there was pus coming from the undermining area around his fifth metatarsal head. This was cultured but will preclude reapplication of a cast. He is seen in conjunction with HBO 12/24; patient had staph lugdunensis in the wound in the undermining area laterally last time. We put him on doxycycline which should have covered this. The wound looks better today. I am going to give him another week of doxycycline before reattempting the total contact cast 12/31; the patient is completing antibiotics. Hemorrhagic debris in the distal part of the wound with some undermining distally. He also had hyper granulation. Extensive debridement with a #5 curette. The infected area that was on the lateral part of the fifth met head is closed over. I do not think he needs any more antibiotics. Patient was seen prior to HBO. Preparations for a total contact cast were made in the cast will be placed post hyperbarics 04/11/19; once again the patient arrives today without complaint. He had been in a cast all week noted that he had heavy drainage this week. This resulted in large raised areas of macerated tissue around the wound 1/14; wound bed looks better slightly smaller. Hydrofera Blue has been changing himself. He had a heavy drainage last week which caused a lot of maceration around the wound so I took him out of a total contact cast he says the drainage is actually better this week He is seen today in conjunction with HBO 1/21; returns to clinic. He was up in [place] for a day or 2 attending a funeral. He comes back in with the wound larger and with a large area of exposed bone. He had  osteomyelitis and septic arthritis of the fifth left metatarsal head while he was in hospital. He received IV antibiotics in the hospital for a prolonged period of time then 3 weeks of Augmentin. Subsequently I gave him 2 weeks of doxycycline for more superficial wound infection. When I saw this last week the wound was smaller the surface of the wound looks satisfactory. 1/28; patient missed hyperbarics today. Bone biopsy I did last time showed Enterococcus faecalis and Staphylococcus lugdunensis . He has a wide area of exposed bone. We are going to use silver alginate as of today. I had another ethical discussion with the patient. 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 In this situation I think the likelihood of healing this is low. Therefore I have recommended a ray amputation and with the patient's agreement I have referred him to Dr. Doran Durand. The other issue is that his compliance with hyperbarics has been minimal because of his work schedule and given his underlying decision I am going to stop this today READMISSION 10/24/2019 MRI 09/29/2019 left foot IMPRESSION: 1. Apparent skin ulceration inferior and lateral to the 5th metatarsal base with underlying heterogeneous T2 signal and enhancement in the subcutaneous fat.  Small peripherally enhancing fluid collections along the plantar and lateral aspects of the 5th metatarsal base suspicious for abscesses. 2. Interval amputation through the mid 5th metatarsal with nonspecific low-level marrow edema and enhancement. Given the proximity to the adjacent soft tissue inflammatory changes, osteomyelitis cannot be excluded. 3. The additional bones appear unremarkable. MRI 09/29/2019 right foot IMPRESSION: 1. Soft tissue ulceration lateral to the 5th MTP joint. There is low-level T2 hyperintensity within the 4th and 5th metatarsal heads and adjacent proximal phalanges without abnormal T1 signal or cortical  destruction. These findings are nonspecific and could be seen with early marrow edema, hyperemia or early osteomyelitis. No evidence of septic joint. 2. Mild tenosynovitis and synovial enhancement associated with the extensor digitorum tendons at the level of the midfoot. 3. Diffuse low-level muscular T2 hyperintensity and enhancement, most consistent with diabetic myopathy. LEFT FOOT BONE Methicillin resistant staphylococcus aureus Staphylococcus lugdunensis MIC MIC CIPROFLOXACIN >=8 RESISTANT Resistant <=0.5 SENSI... Sensitive CLINDAMYCIN <=0.25 SENS... Sensitive >=8 RESISTANT Resistant ERYTHROMYCIN >=8 RESISTANT Resistant >=8 RESISTANT Resistant GENTAMICIN <=0.5 SENSI... Sensitive <=0.5 SENSI... Sensitive Inducible Clindamycin NEGATIVE Sensitive NEGATIVE Sensitive OXACILLIN >=4 RESISTANT Resistant 2 SENSITIVE Sensitive RIFAMPIN <=0.5 SENSI... Sensitive <=0.5 SENSI... Sensitive TETRACYCLINE <=1 SENSITIVE Sensitive <=1 SENSITIVE Sensitive TRIMETH/SULFA <=10 SENSIT Sensitive <=10 SENSIT Sensitive ... Marland Kitchen.. VANCOMYCIN 1 SENSITIVE Sensitive <=0.5 SENSI... Sensitive Right foot bone . Component 3 wk ago Specimen Description BONE Special Requests RIGHT 4 METATARSAL SAMPLE B Gram Stain NO WBC SEEN NO ORGANISMS SEEN Culture RARE METHICILLIN RESISTANT STAPHYLOCOCCUS AUREUS NO ANAEROBES ISOLATED Performed at [facility], [place] [address], [place], [place] [address] Report Status 10/08/2019 FINAL Organism ID, Bacteria METHICILLIN RESISTANT STAPHYLOCOCCUS AUREUS Resulting Agency [facility] Susceptibility Methicillin resistant staphylococcus aureus MIC CIPROFLOXACIN >=8 RESISTANT Resistant CLINDAMYCIN <=0.25 SENS... Sensitive ERYTHROMYCIN >=8 RESISTANT Resistant GENTAMICIN <=0.5 SENSI... Sensitive Inducible Clindamycin NEGATIVE Sensitive OXACILLIN >=4 RESISTANT Resistant RIFAMPIN <=0.5 SENSI... Sensitive TETRACYCLINE <=1 SENSITIVE Sensitive TRIMETH/SULFA <=10 SENSIT  Sensitive ... VANCOMYCIN 1 SENSITIVE Sensitive This is a patient we had in clinic earlier this year with a wound over his left fifth metatarsal head. 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 Doran Durand on 05/16/2019. He seems to have developed problems with wounds on his bilateral feet in June 2021 although he may have had problems earlier than this. He was in an urgent care with a right foot ulcer on 09/26/2019 and given a course of doxycycline. This was apparently after having trouble getting into see orthopedics. He was seen by podiatry on 09/28/2019 noted to have bilateral lower extremity ulcers including the left lateral fifth metatarsal base and the right subfifth met head. It was noted that had purulent drainage at that time. He required hospitalization from 6/20 through 7/2. This was because of worsening right foot wounds. He underwent bilateral operative incision and drainage and bone biopsies bilaterally. Culture results are listed above. He has been referred back to clinic by Dr. Jacqualyn Posey of podiatry. He is also followed by Dr. Megan Salon who saw him yesterday. He was discharged from hospital on Zyvox Flagyl and Levaquin and yesterday changed to doxycycline Flagyl and Levaquin. His inflammatory markers on 6/26 showed a sedimentation rate of 129 and a C-reactive protein of 5. This is improved to 14 and 1.3 respectively.  This would indicate improvement. ABIs in our clinic today were 1.23 on the right and 1.20 on the left 11/01/2019 on evaluation today patient appears to be doing fairly well in regard to the wounds on his feet at this point. Fortunately there is no signs of active infection at this time. No fevers, chills,  nausea, vomiting, or diarrhea. He currently is seeing infectious disease and still under their care at this point. Subsequently he also has both wounds which she has not been using collagen on as he did not receive that in his packaging he did not call us and let us know that. Apparently that just was missed on the order. Nonetheless we will get that straightened out today. Electronic Signature(s) Signed: 11/01/2019 3:52:22 PM By: Worthy Keeler PA-C Entered By: Worthy Keeler on 11/01/2019 15:52:21 -------------------------------------------------------------------------------- Physical Exam Details Patient Name: Date of Service: Max Chamber [name] 11/01/2019 2:30 PM Medical Record Number: [id number] Patient Account Number: [id number] Date of Birth/Sex: Treating RN: 06-25-1985 (34 y.o. [name] Primary Care Provider: Kandice [name] Other Clinician: Referring Provider: Treating Provider/Extender: Candelaria Stagers Weeks in Treatment: 1 Constitutional Well-nourished and well-hydrated in no acute distress. Respiratory normal breathing without difficulty. Psychiatric this patient is able to make decisions and demonstrates good insight into disease process. Alert and Oriented x 3. pleasant and cooperative. Notes On inspection patient's wounds did require sharp debridement to clear away some of the necrotic debris which the patient tolerated today without complication post debridement the wound bed appears to be doing much better which is great news. There is no signs of active infection at this time. Electronic Signature(s) Signed: 11/01/2019 3:52:39 PM By: Worthy Keeler PA-C Entered By: Worthy Keeler on 11/01/2019 15:52:39 -------------------------------------------------------------------------------- Physician Orders Details Patient Name: Date of Service: [name] 11/01/2019 2:30 PM Medical Record Number: [id number] Patient Account Number:  [id number] Date of Birth/Sex: Treating RN: 10-08-1985 (34 y.o. 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 Dressing Change Frequency Wound #2 Right,Lateral Foot Change Dressing every other day. Wound #3 Left,Lateral Foot Change Dressing every other day. Skin Barriers/Peri-Wound Care Moisturizing lotion - both legs and feet Wound Cleansing May shower and wash wound with soap and water. - on days that dressing is changed Primary Wound Dressing Wound #2 Right,Lateral Foot Silver Collagen - moisten with hydrogel or KY jelly Wound #3 Left,Lateral Foot Silver Collagen - moisten with hydrogel or KY jelly Secondary Dressing Wound #2 Right,Lateral Foot Foam - foam donut Kerlix/Rolled Gauze Dry Gauze Wound #3 Left,Lateral Foot Foam - foam donut Kerlix/Rolled Gauze Dry Gauze Off-Loading Open toe surgical shoe to: - both feet Electronic Signature(s) Signed: 11/01/2019 5:02:39 PM By: Kela Millin Signed: 11/01/2019 5:19:16 PM By: Worthy Keeler PA-C Entered By: Kela Millin on 11/01/2019 15:47:38 -------------------------------------------------------------------------------- Problem List Details Patient Name: Date of Service: [name], [name]  11/01/2019 2:30 PM Medical Record Number: [id number] Patient Account Number: [id number] Date of Birth/Sex: Treating RN: Nov 30, 1985 (34 y.o. [name] Primary Care Provider: Kandice [name] Other Clinician: Referring Provider: Treating Provider/Extender: [name] in Treatment: 1 Active Problems ICD-10 Encounter Code Description Active Date MDM Diagnosis E11.621 Type 2 diabetes mellitus with foot ulcer 10/24/2019 No Yes L97.518 Non-pressure chronic ulcer of other part of right foot with other specified 10/24/2019 No Yes severity L97.528 Non-pressure chronic ulcer of other part of left foot with other specified 10/24/2019 No Yes severity 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, right ankle and foot 10/24/2019 No Yes 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, left ankle and foot 10/24/2019 No Yes B95.62 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 10/24/2019 No Yes classified elsewhere Inactive Problems Resolved Problems Electronic Signature(s) Signed: 11/01/2019 5:02:39 PM By: Kela Millin Signed: 11/01/2019 5:19:16 PM By: Worthy Keeler PA-C Previous Signature: 11/01/2019 2:22:32 PM Version By: Worthy Keeler PA-C Entered By: Kela Millin on 11/01/2019 15:43:02 -------------------------------------------------------------------------------- Progress Note Details Patient Name: Date of Service: [name] 11/01/2019 2:30 PM Medical Record Number: [id number] Patient Account Number: [id number] Date of Birth/Sex: Treating RN: 01/03/1986 (34 y.o. 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 He comes in for review of a wound over the plantar fifth metatarsal head extending into the lateral part of the foot. He was followed for this previously by his podiatrist Dr. Cornelius Moras. As the patient tells his story he went to see podiatry first for a swelling he developed on the lateral part of his fifth metatarsal head in May. He states this was "open" by podiatry and the area closed. He was followed up in June and it was again opened callus removed and it closed promptly. There were plans being made for surgery on the fifth metatarsal head in June however his blood sugar was apparently too high for anesthesia. Apparently the area was debrided and opened again in June and it is never closed since. Looking over the records from podiatry I am really not able to follow this. It was clear when he was first seen it was before 5/14 at that point he already had a wound. By 5/17 the ulcer was resolved. I do not see anything about a procedure. On 5/28 noted to have pre-ulcerative moderate keratosis. X-ray noted 1/5 contracted toe and tailor's bunion and metatarsal deformity. On a visit date on 09/28/2018 the dorsal part of the left foot it healed and resolved. There was concern about swelling in his lower extremity he was sent to the ER.. As far as I can tell he was seen in the ER on 7/12 with an ulcer on his left foot. A DVT rule out of the left leg was negative. I do not think I have complete records from podiatry but I am not able to verify the procedures this patient states he had. He states after the last procedure the wound has never  closed although I am not able to follow this in the records I have from podiatry.  He has not had a recent x-ray The patient has been using Neosporin on the wound. He is wearing a Darco shoe. He is still very active up on his foot working and exercising. Past medical history; type 2 diabetes ketosis-prone, leg swelling with a negative DVT study in July. Non-smoker ABI in our clinic was 0.85 on the left 10/16; substantial wound on the plantar left fifth met head extending laterally almost to the dorsal fifth MTP. We have been using silver alginate we gave him a Darco forefoot off loader. An x-ray did not show evidence of osteomyelitis did note soft tissue emphysema which I think was due to gas tracking through an open wound. There is no doubt in my mind he requires an MRI 10/23; MRI not booked until 3 November at the earliest this is largely due to his glucose sensor in the right arm. We have been using silver alginate. There has been an improvement 10/29; I am still not exactly sure when his MRI is booked for. He says it is the third but it is the 10th in epic. This definitely needs to be done. He is running a low-grade fever today but no other symptoms. No real improvement in the 1 02/26/2019 patient presents today for a follow-up visit here in our clinic he is last been seen in the clinic on October 29. Subsequently we were working on getting MRI to evaluate and see what exactly was going on and where we would need to go from the standpoint of whether or not he had osteomyelitis and again what treatments were going be required. Subsequently the patient ended up being admitted to the hospital on 02/07/2019 and was discharged on 02/14/2019. This is a somewhat interesting admission with a discharge diagnosis of pneumonia due to COVID-19 although he was positive for COVID-19 when tested at the urgent care but negative x2 when he was actually in the hospital. With that being said he did have acute respiratory failure with hypoxia and it was noted he also have a left foot ulceration  with osteomyelitis. With that being said he did require oxygen for his pneumonia and I level 4 L. He was placed on antivirals and steroids for the COVID-19. He was also transferred to the [facility] at one point. Nonetheless he did subsequently discharged home and since being home has done much better in that regard. The CT angiogram did not show any pulmonary embolism. With regard to the osteomyelitis the patient was placed on vancomycin and Zosyn while in the hospital but has been changed to Augmentin at discharge. It was also recommended that he follow- up with wound care and podiatry. Podiatry however wanted him to see [name] according to the patient prior to them doing anything further. His hemoglobin A1c was 9.9 as noted in the hospital. Have an MRI of the left foot performed while in the hospital on 02/04/2019. 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 There is an overlying plantar open wound noted an abscess tracking back along the lateral aspect of the fifth metatarsal shaft. There is otherwise diffuse cellulitis and mild fasciitis without findings of polymyositis. The patient did have recently pneumonia secondary to COVID-19 I looked in the chart through epic and it does appear that the patient may need to have an additional x-ray just to  ensure everything is cleared and that he has no airspace disease prior to putting him into the chamber. 03/05/2019; patient was readmitted to the clinic last week. He was hospitalized twice for a viral upper respiratory tract infection from 11/1 through 11/4 and then 11/5 through 11/12 ultimately this turned out to be Covid pneumonitis. Although he was discharged on oxygen he is not using it. He says he feels fine. He has no exercise limitation no cough no sputum. His O2 sat in our clinic today was 100% on room air. He did manage to have his MRI which showed  septic arthritis at the fifth MTP joint and osteomyelitis involving the fifth metatarsal head and proximal phalanx. He received Vanco and Zosyn in the hospital and then was discharged on 2 weeks of Augmentin. I do not see any relevant cultures. He was supposed to follow-up with infectious disease but I do not see that he has an appointment. 12/8; patient saw Dr. Novella Olive of infectious disease last week. He felt that he had had adequate antibiotic therapy. He did not go to follow-up with Dr. Amalia Hailey of podiatry and I have again talked to him about the pros and cons of this. He does not want to consider a ray amputation of this time. He is aware of the risks of recurrence, migration etc. He started HBO today and tolerated this well. He can complete the Augmentin that I gave him last week. I have looked over the lab work that Dr. Chana Bode ordered his C-reactive protein was 3.3 and his sedimentation rate was 17. The C-reactive protein is never really been measurably that high in this patient 12/15; not much change in the wound today however he has undermining along the lateral part of the foot again more extensively than last week. He has some rims of epithelialization. We have been using silver alginate. He is undergoing hyperbarics but did not dive today 12/18; in for his obligatory first total contact cast change. Unfortunately there was pus coming from the undermining area around his fifth metatarsal head. This was cultured but will preclude reapplication of a cast. He is seen in conjunction with HBO 12/24; patient had staph lugdunensis in the wound in the undermining area laterally last time. We put him on doxycycline which should have covered this. The wound looks better today. I am going to give him another week of doxycycline before reattempting the total contact cast 12/31; the patient is completing antibiotics. Hemorrhagic debris in the distal part of the wound with some undermining distally. He also  had hyper granulation. Extensive debridement with a #5 curette. The infected area that was on the lateral part of the fifth met head is closed over. I do not think he needs any more antibiotics. Patient was seen prior to HBO. Preparations for a total contact cast were made in the cast will be placed post hyperbarics 04/11/19; once again the patient arrives today without complaint. He had been in a cast all week noted that he had heavy drainage this week. This resulted in large raised areas of macerated tissue around the wound 1/14; wound bed looks better slightly smaller. Hydrofera Blue has been changing himself. He had a heavy drainage last week which caused a lot of maceration around the wound so I took him out of a total contact cast he says the drainage is actually better this week He is seen today in conjunction with HBO 1/21; returns to clinic. He was up in [place] for a day or 2  attending a funeral. He comes back in with the wound larger and with a large area of exposed bone. He had osteomyelitis and septic arthritis of the fifth left metatarsal head while he was in hospital. He received IV antibiotics in the hospital for a prolonged period of time then 3 weeks of Augmentin. Subsequently I gave him 2 weeks of doxycycline for more superficial wound infection. When I saw this last week the wound was smaller the surface of the wound looks satisfactory. 1/28; patient missed hyperbarics today. Bone biopsy I did last time showed Enterococcus faecalis and Staphylococcus lugdunensis . He has a wide area of exposed bone. We are going to use silver alginate as of today. I had another ethical discussion with the patient. 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 In this situation I think the likelihood of healing this is low. Therefore I have recommended a ray amputation and with the patient's agreement I have referred him to Dr. Doran Durand. The other issue is that his  compliance with hyperbarics has been minimal because of his work schedule and given his underlying decision I am going to stop this today READMISSION 10/24/2019 MRI 09/29/2019 left foot IMPRESSION: 1. Apparent skin ulceration inferior and lateral to the 5th metatarsal base with underlying heterogeneous T2 signal and enhancement in the subcutaneous fat. Small peripherally enhancing fluid collections along the plantar and lateral aspects of the 5th metatarsal base suspicious for abscesses. 2. Interval amputation through the mid 5th metatarsal with nonspecific low-level marrow edema and enhancement. Given the proximity to the adjacent soft tissue inflammatory changes, osteomyelitis cannot be excluded. 3. The additional bones appear unremarkable. MRI 09/29/2019 right foot IMPRESSION: 1. Soft tissue ulceration lateral to the 5th MTP joint. There is low-level T2 hyperintensity within the 4th and 5th metatarsal heads and adjacent proximal phalanges without abnormal T1 signal or cortical destruction. These findings are nonspecific and could be seen with early marrow edema, hyperemia or early osteomyelitis. No evidence of septic joint. 2. Mild tenosynovitis and synovial enhancement associated with the extensor digitorum tendons at the level of the midfoot. 3. Diffuse low-level muscular T2 hyperintensity and enhancement, most consistent with diabetic myopathy. LEFT FOOT BONE Methicillin resistant staphylococcus aureus Staphylococcus lugdunensis MIC MIC CIPROFLOXACIN >=8 RESISTANT Resistant <=0.5 SENSI... Sensitive CLINDAMYCIN <=0.25 SENS... Sensitive >=8 RESISTANT Resistant ERYTHROMYCIN >=8 RESISTANT Resistant >=8 RESISTANT Resistant GENTAMICIN <=0.5 SENSI... Sensitive <=0.5 SENSI... Sensitive Inducible Clindamycin NEGATIVE Sensitive NEGATIVE Sensitive OXACILLIN >=4 RESISTANT Resistant 2 SENSITIVE Sensitive RIFAMPIN <=0.5 SENSI... Sensitive <=0.5 SENSI... Sensitive TETRACYCLINE <=1 SENSITIVE  Sensitive <=1 SENSITIVE Sensitive TRIMETH/SULFA <=10 SENSIT Sensitive <=10 SENSIT Sensitive ... Marland Kitchen.. VANCOMYCIN 1 SENSITIVE Sensitive <=0.5 SENSI... Sensitive Right foot bone . Component 3 wk ago Specimen Description BONE Special Requests RIGHT 4 METATARSAL SAMPLE B Gram Stain NO WBC SEEN NO ORGANISMS SEEN Culture RARE METHICILLIN RESISTANT STAPHYLOCOCCUS AUREUS NO ANAEROBES ISOLATED Performed at [facility], [place] [address], [place], [place] [address] Report Status 10/08/2019 FINAL Organism ID, Bacteria METHICILLIN RESISTANT STAPHYLOCOCCUS AUREUS Resulting Agency [facility] Susceptibility Methicillin resistant staphylococcus aureus MIC CIPROFLOXACIN >=8 RESISTANT Resistant CLINDAMYCIN <=0.25 SENS... Sensitive ERYTHROMYCIN >=8 RESISTANT Resistant GENTAMICIN <=0.5 SENSI... Sensitive Inducible Clindamycin NEGATIVE Sensitive OXACILLIN >=4 RESISTANT Resistant RIFAMPIN <=0.5 SENSI... Sensitive TETRACYCLINE <=1 SENSITIVE Sensitive TRIMETH/SULFA <=10 SENSIT Sensitive ... VANCOMYCIN 1 SENSITIVE Sensitive This is a patient we had in clinic earlier this year with a wound over his left fifth metatarsal head. 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 Doran Durand on 05/16/2019. He seems to have developed problems with wounds on his bilateral feet in June 2021 although he may have had problems earlier than this. He was in an urgent care with a right foot ulcer on 09/26/2019 and given a course of doxycycline. This was apparently after having trouble getting into see orthopedics. He was seen by podiatry on 09/28/2019 noted to have bilateral lower extremity ulcers including the left lateral fifth metatarsal base and the  right subfifth met head. It was noted that had purulent drainage at that time. He required hospitalization from 6/20 through 7/2. This was because of worsening right foot wounds. He underwent bilateral operative incision and drainage and bone biopsies bilaterally. Culture results are listed above. He has been referred back to clinic by Dr. Jacqualyn Posey of podiatry. He is also followed by Dr. Megan Salon who saw him yesterday. He was discharged from hospital on Zyvox Flagyl and Levaquin and yesterday changed to doxycycline Flagyl and Levaquin. His inflammatory markers on 6/26 showed a sedimentation rate of 129 and a C-reactive protein of 5. This is improved to 14 and 1.3 respectively. This would indicate improvement. ABIs in our clinic today were 1.23 on the right and 1.20 on the left 11/01/2019 on evaluation today patient appears to be doing fairly well in regard to the wounds on his feet at this point. Fortunately there is no signs of active infection at this time. No fevers, chills, nausea, vomiting, or diarrhea. He currently is seeing infectious disease and still under their care at this point. Subsequently he also has both wounds which she has not been using collagen on as he did not receive that in his packaging he did not call us and let us know that. Apparently that just was missed on the order. Nonetheless we will get that straightened out today. Objective Constitutional Well-nourished and well-hydrated in no acute distress. Vitals Time Taken: 2:48 PM, Height: 77 in, Weight: 280 lbs, BMI: 33.2, Temperature: 98.8 F, Pulse: 106 bpm, Respiratory Rate: 18 breaths/min, Blood Pressure: 95/65 mmHg. Respiratory normal breathing without difficulty. Psychiatric this patient is able to make decisions and demonstrates good insight into disease process. Alert and Oriented x 3. pleasant and cooperative. General Notes: On inspection patient's wounds did require sharp debridement to clear away some of the  necrotic debris which the patient tolerated today without complication post debridement the wound bed appears to be doing much better which is great news. There is no signs of active infection at this time. Integumentary (Hair, Skin) Wound #2 status is Open. Original cause of wound was Gradually Appeared. The wound is located on the Right,Lateral Foot. The wound measures 2.5cm length x 2.2cm width x 0.1cm depth; 4.32cm^2 area and 0.432cm^3 volume. There is Fat Layer (Subcutaneous Tissue) Exposed exposed. There is no tunneling or undermining noted. There is a medium amount of serosanguineous drainage noted. There is medium (34-66%) red, pink granulation within the wound bed. There is a medium (34-66%) amount of necrotic tissue within the wound bed including Adherent Slough. Wound #3 status is Open. Original cause of wound was Trauma. The wound is located on the Left,Lateral Foot. The wound measures 1cm length x 1.2cm width x 0.1cm depth; 0.942cm^2 area and 0.094cm^3 volume. There is Fat Layer (Subcutaneous Tissue) Exposed exposed.  There is no tunneling or undermining noted. There is a medium amount of serosanguineous drainage noted. The wound margin is flat and intact. There is large (67-100%) pink granulation within the wound bed. There is a small (1-33%) amount of necrotic tissue within the wound bed including Adherent Slough. 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 There was a Excisional Skin/Subcutaneous Tissue Debridement with a total area of 5.5 sq cm performed by Worthy Keeler, PA. With the following instrument(s): Curette to remove Viable and Non-Viable tissue/material. Material removed includes Subcutaneous Tissue and Slough and after achieving pain control using Other (benzocaine, 20%). No specimens were taken. A time out was conducted at 15:45, prior to the start of the procedure. A Minimum amount of bleeding was controlled with Pressure. The procedure was tolerated well with a pain level of 0 throughout and a pain level of 0 following the procedure. Post Debridement Measurements: 2.5cm length x 2.2cm width x 0.1cm depth; 0.432cm^3 volume. Character of Wound/Ulcer Post Debridement is improved. Severity of Tissue Post Debridement is: Fat layer exposed. Post procedure Diagnosis Wound #2: Same as Pre-Procedure Wound #3 Pre-procedure diagnosis of Wound #3 is a Diabetic Wound/Ulcer of the Lower Extremity located on the Left,Lateral Foot .Severity of Tissue Pre Debridement is: Fat layer exposed. There was a Excisional Skin/Subcutaneous Tissue Debridement with a total area of 1.2 sq cm performed by Worthy Keeler, PA. With the following instrument(s): Curette to remove Viable and Non-Viable tissue/material. Material removed includes Subcutaneous Tissue and Slough and after achieving pain control using Other (benzocaine, 20%). No specimens were taken. A time out was conducted at 15:45, prior to the start of the procedure. A Minimum amount of bleeding was controlled with Pressure. The procedure was tolerated well with a pain level of 0 throughout and a pain level of 0 following the procedure. Post Debridement Measurements: 1cm length x 1.2cm width x 0.1cm depth; 0.094cm^3 volume. Character of Wound/Ulcer Post Debridement is improved. Severity of Tissue Post Debridement is: Fat layer  exposed. Post procedure Diagnosis Wound #3: Same as Pre-Procedure Plan Follow-up Appointments: Return Appointment in 2 weeks. Dressing Change Frequency: Wound #2 Right,Lateral Foot: Change Dressing every other day. Wound #3 Left,Lateral Foot: Change Dressing every other day. Skin Barriers/Peri-Wound Care: Moisturizing lotion - both legs and feet Wound Cleansing: May shower and wash wound with soap and water. - on days that dressing is changed Primary Wound Dressing: Wound #2 Right,Lateral Foot: Silver Collagen - moisten with hydrogel or KY jelly Wound #3 Left,Lateral Foot: Silver Collagen - moisten with hydrogel or KY jelly Secondary Dressing: Wound #2 Right,Lateral Foot: Foam - foam donut Kerlix/Rolled Gauze Dry Gauze Wound #3 Left,Lateral Foot: Foam - foam donut Kerlix/Rolled Gauze Dry Gauze Off-Loading: Open toe surgical shoe to: - both feet 1. I would recommend currently that we go ahead and initiate treatment with a continuation of the silver collagen dressing we did go ahead and get that ordered for him today. 2. I am also can recommend that we  continue with moistening with hydrogel he does have that currently and that should do well for him as well. 3. I am also can recommend that the patient continue to monitor for any signs of worsening as far as infection or anything is concerned drainage wise right now it seems like he is doing quite well. We will see patient back for reevaluation in 2 weeks here in the clinic. If anything worsens or changes patient will contact our office for additional recommendations. Electronic Signature(s) Signed: 11/01/2019 3:53:18 PM By: Worthy Keeler PA-C Entered By: Worthy Keeler on 11/01/2019 15:53:18 -------------------------------------------------------------------------------- SuperBill Details Patient Name: Date of Service: [name] 11/01/2019 Medical Record Number: [id number] Patient Account Number: [id number] Date of  Birth/Sex: Treating RN: 19-Oct-1985 (34 y.o. [name] Primary Care Provider: Kandice [name] Other Clinician: Referring Provider: Treating Provider/Extender: Candelaria Stagers Weeks in Treatment: 1 Diagnosis Coding ICD-10 Codes Code Description [phone] Type 2 diabetes mellitus with foot ulcer L97.518 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ity L97.528 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ity 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, right ankle and foot 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, left ankle and foot B95.62 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 classified elsewhere Facility Procedures CPT4 Code: [id number] Description: 11042 - DEB SUBQ TISSUE 20 SQ CM/< ICD-10 Diagnosis Description L97.518 Non-pressure chronic ulcer of other part of right foot with other specified seve L97.528 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 Modifier: rity ity Quantity: 1 Physician Procedures : CPT4 Code Description Modifier [id number] 11042 - WC PHYS SUBQ TISS 20 SQ CM ICD-10 Diagnosis Description L97.518 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ity L97.528 Non-pressure chronic ulcer of other part of left  foot with other specified severity Quantity: 1 Electronic Signature(s) Signed: 11/01/2019 3:53:42 PM By: Worthy Keeler PA-C Entered By: Worthy Keeler on 11/01/2019 15:53:41

## 2019-11-06 NOTE — Progress Notes (Signed)
[place], [name] ([id number]) Visit Report for 11/01/2019 Arrival Information Details Patient Name: Date of Service: [name] 11/01/2019 2:30 PM Medical Record Number: [id number] Patient Account Number: [id number] Date of Birth/Sex: Treating RN: May 10, 1985 (34 y.o. [name] Primary Care Tram Wrenn: Katy Apo Other Clinician: Referring Keaundra Stehle: Treating Mica Ramdass/Extender: Bonita Quin in Treatment: 1 Visit Information History Since Last Visit Added or deleted any medications: No Patient Arrived: Ambulatory Any new allergies or adverse reactions: No Arrival Time: 14:44 Had a fall or experienced change in No Accompanied By: self activities of daily living that may affect Transfer Assistance: None risk of falls: Patient Identification Verified: Yes Signs or symptoms of abuse/neglect since last visito No Secondary Verification Process Completed: Yes Hospitalized since last visit: No Patient Requires Transmission-Based Precautions: No Implantable device outside of the clinic excluding No Patient Has Alerts: No cellular tissue based products placed in the center since last visit: Has Dressing in Place as Prescribed: Yes Pain Present Now: No Electronic Signature(s) Signed: 11/06/2019 8:00:11 AM By: Karl Ito Entered By: Karl Ito on 11/01/2019 14:46:19 -------------------------------------------------------------------------------- Encounter Discharge Information Details Patient Name: Date of Service: [name], [name] 11/01/2019 2:30 PM Medical Record Number: [id number] Patient Account Number: [id number] Date of Birth/Sex: Treating RN: [birth year] (34 y.o. [name] Primary Care Sueann Brownley: Katy Apo Other Clinician: Referring Sarenity Ramaker: Treating Gaynel Schaafsma/Extender: Bonita Quin in Treatment: 1 Encounter Discharge Information Items Post Procedure Vitals Discharge Condition:  Stable Temperature (F): 98.8 Ambulatory Status: Ambulatory Pulse (bpm): 106 Discharge Destination: Home Respiratory Rate (breaths/min): 18 Transportation: Private Auto Blood Pressure (mmHg): 95/65 Accompanied By: alone Schedule Follow-up Appointment: Yes Clinical Summary of Care: Patient Declined Electronic Signature(s) Signed: 11/01/2019 5:14:13 PM By: Zandra Abts RN, BSN Entered By: Zandra Abts on 11/01/2019 17:12:00 -------------------------------------------------------------------------------- Lower Extremity Assessment Details Patient Name: Date of Service: [name], [name] 11/01/2019 2:30 PM Medical Record Number: [id number] Patient Account Number: [id number] Date of Birth/Sex: Treating RN: 11-01-1985 (34 y.o. [name] Primary Care Khoi Hamberger: Katy Apo Other Clinician: Referring Akirah Storck: Treating Jory Welke/Extender: Roxan Diesel Weeks in Treatment: 1 Edema Assessment Assessed: [Left: No] [Right: No] Edema: [Left: No] [Right: No] Calf Left: Right: Point of Measurement: 48 cm From Medial Instep 45 cm 44 cm Ankle Left: Right: Point of Measurement: 10 cm From Medial Instep 28 cm 32 cm Vascular Assessment Pulses: Dorsalis Pedis Palpable: [Left:Yes] [Right:Yes] Electronic Signature(s) Signed: 11/01/2019 5:02:39 PM By: Cherylin Mylar Entered By: Cherylin Mylar on 11/01/2019 15:49:06 -------------------------------------------------------------------------------- Multi-Disciplinary Care Plan Details Patient Name: Date of Service: [name], [name] 11/01/2019 2:30 PM Medical Record Number: [id number] Patient Account Number: [id number] Date of Birth/Sex: Treating RN: [birth year] (34 y.o. [name] Primary Care Ryenne Lynam: Katy Apo Other Clinician: Referring Sharnelle Cappelli: Treating Arlean Thies/Extender: Bonita Quin in Treatment: 1 Active Inactive Nutrition Nursing  Diagnoses: Imbalanced nutrition Potential for alteratiion in Nutrition/Potential for imbalanced nutrition Goals: Patient/caregiver agrees to and verbalizes understanding of need to use nutritional supplements and/or vitamins as prescribed Date Initiated: 10/24/2019 Target Resolution Date: 11/22/2019 Goal Status: Active Patient/caregiver will maintain therapeutic glucose control Date Initiated: 10/24/2019 Target Resolution Date: 11/22/2019 Goal Status: Active Interventions: Assess HgA1c results as ordered upon admission and as needed Assess patient nutrition upon admission and as needed per policy Provide education on elevated blood sugars and impact on wound healing Provide education on nutrition Treatment Activities: Education provided on Nutrition :  10/24/2019 Notes: Wound/Skin Impairment Nursing Diagnoses: Impaired tissue integrity Knowledge deficit related to ulceration/compromised skin integrity Goals: Patient/caregiver will verbalize understanding of skin care regimen Date Initiated: 10/24/2019 Target Resolution Date: 11/22/2019 Goal Status: Active Ulcer/skin breakdown will have a volume reduction of 30% by week 4 Date Initiated: 10/24/2019 Target Resolution Date: 11/22/2019 Goal Status: Active Interventions: Assess patient/caregiver ability to obtain necessary supplies Assess patient/caregiver ability to perform ulcer/skin care regimen upon admission and as needed Assess ulceration(s) every visit Provide education on ulcer and skin care Notes: Electronic Signature(s) Signed: 11/01/2019 5:02:39 PM By: Cherylin Mylar Entered By: Cherylin Mylar on 11/01/2019 15:43:54 -------------------------------------------------------------------------------- Pain Assessment Details Patient Name: Date of Service: [name] 11/01/2019 2:30 PM Medical Record Number: [id number] Patient Account Number: [id number] Date of Birth/Sex: Treating RN: 28-Jul-1985 (34 y.o. [name] Primary Care Bronte Kropf: Katy Apo Other Clinician: Referring Jamyrah Saur: Treating Abbas Beyene/Extender: Roxan Diesel Weeks in Treatment: 1 Active Problems Location of Pain Severity and Description of Pain Patient Has Paino No Site Locations Pain Management and Medication Current Pain Management: Electronic Signature(s) Signed: 11/01/2019 5:02:39 PM By: Cherylin Mylar Signed: 11/06/2019 8:00:11 AM By: Karl Ito Entered By: Karl Ito on 11/01/2019 14:48:22 -------------------------------------------------------------------------------- Patient/Caregiver Education Details Patient Name: Date of Service: [name] 7/30/2021andnbsp2:30 PM Medical Record Number: [id number] Patient Account Number: [id number] Date of Birth/Gender: Treating RN: 08/30/1985 (34 y.o. [name] Primary Care Physician: Katy Apo Other Clinician: Referring Physician: Treating Physician/Extender: Bonita Quin in Treatment: 1 Education Assessment Education Provided To: Patient Education Topics Provided Elevated Blood Sugar/ Impact on Healing: Handouts: Elevated Blood Sugars: How Do They Affect Wound Healing Methods: Explain/Verbal Responses: State content correctly Nutrition: Handouts: Elevated Blood Sugars: How Do They Affect Wound Healing Methods: Explain/Verbal Responses: State content correctly Wound/Skin Impairment: Handouts: Caring for Your Ulcer Methods: Explain/Verbal Responses: State content correctly Electronic Signature(s) Signed: 11/01/2019 5:02:39 PM By: Cherylin Mylar Entered By: Cherylin Mylar on 11/01/2019 15:44:17 -------------------------------------------------------------------------------- Wound Assessment Details Patient Name: Date of Service: [name] 11/01/2019 2:30 PM Medical Record Number: [id number] Patient Account Number: [id number] Date of  Birth/Sex: Treating RN: [birth year] (34 y.o. [name] Primary Care Isaias Dowson: Katy Apo Other Clinician: Referring Alexiah Koroma: Treating Saliyah Gillin/Extender: Roxan Diesel Weeks in Treatment: 1 Wound Status Wound Number: 2 Primary Etiology: Diabetic Wound/Ulcer of the Lower Extremity Wound Location: Right, Lateral Foot Wound Status: Open Wounding Event: Gradually Appeared Comorbid History: Type II Diabetes Date Acquired: 09/03/2019 Weeks Of Treatment: 1 Clustered Wound: No Wound Measurements Length: (cm) 2.5 Width: (cm) 2.2 Depth: (cm) 0.1 Area: (cm) 4.32 Volume: (cm) 0.432 % Reduction in Area: 21.6% % Reduction in Volume: 21.6% Epithelialization: None Tunneling: No Undermining: No Wound Description Classification: Grade 2 Exudate Amount: Medium Exudate Type: Serosanguineous Exudate Color: red, brown Foul Odor After Cleansing: No Slough/Fibrino Yes Wound Bed Granulation Amount: Medium (34-66%) Exposed Structure Granulation Quality: Red, Pink Fascia Exposed: No Necrotic Amount: Medium (34-66%) Fat Layer (Subcutaneous Tissue) Exposed: Yes Necrotic Quality: Adherent Slough Tendon Exposed: No Muscle Exposed: No Joint Exposed: No Bone Exposed: No Treatment Notes Wound #2 (Right, Lateral Foot) 1. Cleanse With Wound Cleanser 3. Primary Dressing Applied [employer] 4. Secondary Dressing Dry Gauze Roll Gauze Foam 5. Secured With Secretary/administrator) Signed: 11/01/2019 4:58:30 PM By: Yevonne Pax RN Signed: 11/01/2019 5:02:39 PM By: Cherylin Mylar Entered By: Yevonne Pax on 11/01/2019 15:07:22 -------------------------------------------------------------------------------- Wound Assessment Details Patient Name:  Date of Service: [name] 11/01/2019 2:30 PM Medical Record Number: [id number] Patient Account Number: [id number] Date of Birth/Sex: Treating RN: [birth year] (34 y.o. [name] Primary Care  Kohler Pellerito: Katy Apo Other Clinician: Referring Darrik Richman: Treating Damarcus Reggio/Extender: Roxan Diesel Weeks in Treatment: 1 Wound Status Wound Number: 3 Primary Etiology: Diabetic Wound/Ulcer of the Lower Extremity Wound Location: Left, Lateral Foot Wound Status: Open Wounding Event: Trauma Comorbid History: Type II Diabetes Date Acquired: 10/02/2019 Weeks Of Treatment: 1 Clustered Wound: No Wound Measurements Length: (cm) 1 Width: (cm) 1.2 Depth: (cm) 0.1 Area: (cm) 0.942 Volume: (cm) 0.094 % Reduction in Area: 42.9% % Reduction in Volume: 43% Epithelialization: None Tunneling: No Undermining: No Wound Description Classification: Grade 2 Wound Margin: Flat and Intact Exudate Amount: Medium Exudate Type: Serosanguineous Exudate Color: red, brown Foul Odor After Cleansing: No Slough/Fibrino Yes Wound Bed Granulation Amount: Large (67-100%) Exposed Structure Granulation Quality: Pink Fascia Exposed: No Necrotic Amount: Small (1-33%) Fat Layer (Subcutaneous Tissue) Exposed: Yes Necrotic Quality: Adherent Slough Tendon Exposed: No Muscle Exposed: No Joint Exposed: No Bone Exposed: No Treatment Notes Wound #3 (Left, Lateral Foot) 1. Cleanse With Wound Cleanser 3. Primary Dressing Applied [employer] 4. Secondary Dressing Dry Gauze Roll Gauze Foam 5. Secured With Secretary/administrator) Signed: 11/01/2019 4:58:30 PM By: Yevonne Pax RN Signed: 11/01/2019 5:02:39 PM By: Cherylin Mylar Entered By: Yevonne Pax on 11/01/2019 15:07:42 -------------------------------------------------------------------------------- Vitals Details Patient Name: Date of Service: [name], [name] 11/01/2019 2:30 PM Medical Record Number: [id number] Patient Account Number: [id number] Date of Birth/Sex: Treating RN: [birth year] (34 y.o. [name] Primary Care Heather Mckendree: Katy Apo Other Clinician: Referring Jeanetta Alonzo: Treating  Gianfranco Araki/Extender: Bonita Quin in Treatment: 1 Vital Signs Time Taken: 14:48 Temperature (F): 98.8 Height (in): 77 Pulse (bpm): 106 Weight (lbs): 280 Respiratory Rate (breaths/min): 18 Body Mass Index (BMI): 33.2 Blood Pressure (mmHg): 95/65 Reference Range: 80 - 120 mg / dl Electronic Signature(s) Signed: 11/06/2019 8:00:11 AM By: Karl Ito Entered By: Karl Ito on 11/01/2019 14:48:16

## 2019-11-11 ENCOUNTER — Encounter (HOSPITAL_BASED_OUTPATIENT_CLINIC_OR_DEPARTMENT_OTHER): Payer: BC Managed Care – PPO | Attending: Internal Medicine | Admitting: Internal Medicine

## 2019-11-11 ENCOUNTER — Other Ambulatory Visit: Payer: Self-pay

## 2019-11-11 DIAGNOSIS — E11621 Type 2 diabetes mellitus with foot ulcer: Secondary | ICD-10-CM | POA: Insufficient documentation

## 2019-11-11 DIAGNOSIS — B9562 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 classified elsewhere: Secondary | ICD-10-CM | POA: Insufficient documentation

## 2019-11-11 DIAGNOSIS — M86572 Other chronic hematogenous osteomyelitis, left ankle and foot: Secondary | ICD-10-CM | POA: Insufficient documentation

## 2019-11-11 DIAGNOSIS — E1169 Type 2 diabetes mellitus with other specified complication: Secondary | ICD-10-CM | POA: Diagnosis not present

## 2019-11-11 DIAGNOSIS — L97518 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ity: Secondary | ICD-10-CM | POA: Diagnosis not present

## 2019-11-11 DIAGNOSIS — M86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, right ankle and foot: Secondary | ICD-10-CM | POA: Diagnosis not present

## 2019-11-11 DIAGNOSIS — L97528 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ity: Secondary | ICD-10-CM | POA: Insufficient documentation

## 2019-11-12 NOTE — Progress Notes (Signed)
[name], [name] ([id number]) Visit Report for 11/11/2019 HPI Details Patient Name: Date of Service: [name] D 11/11/2019 11:00 A M Medical Record Number: [id number] Patient Account Number: [id number] Date of Birth/Sex: Treating RN: 08/21/1985 (34 y.o. 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 He comes in for review of a wound over the plantar fifth metatarsal head extending into the lateral part of the foot. He was followed for this previously by his podiatrist Dr. Cornelius Moras. As the patient tells his story he went to see podiatry first for a swelling he developed on the lateral part of his fifth metatarsal head in May. He states this was "open" by podiatry and the area closed. He was followed up in June and it was again opened callus removed and it closed promptly. There were plans being made for surgery on the fifth metatarsal head in June however his blood sugar was apparently too high for anesthesia. Apparently the area was debrided and opened again in June and it is never closed since. Looking over the records from podiatry I am really not able to follow this. It was clear when he was first seen it was before 5/14 at that point he already had a wound. By 5/17 the ulcer was resolved. I do not see anything about a procedure. On 5/28 noted to have pre-ulcerative moderate keratosis. X-ray noted 1/5 contracted toe and tailor's bunion and metatarsal deformity. On a visit date on 09/28/2018 the dorsal part of the left foot it healed and resolved. There was concern about swelling in his lower extremity he was sent to the ER.. As far as I can tell he was seen in the ER on 7/12 with an ulcer on his left foot. A DVT rule  out of the left leg was negative. I do not think I have complete records from podiatry but I am not able to verify the procedures this patient states he had. He states after the last procedure the wound has never closed although I am not able to follow this in the records I have from podiatry. He has not had a recent x-ray The patient has been using Neosporin on the wound. He is wearing a Darco shoe. He is still very active up on his foot working and exercising. Past medical history; type 2 diabetes ketosis-prone, leg swelling with a negative DVT study in July. Non-smoker ABI in our clinic was 0.85 on the left 10/16; substantial wound on the plantar left fifth met head extending laterally almost to the dorsal fifth MTP. We have been using silver alginate we gave him a Darco forefoot off loader. An x-ray did not show evidence of osteomyelitis did note soft tissue emphysema which I think was due to gas tracking through an open wound. There is no doubt in my mind he requires an MRI 10/23; MRI not booked until 3 November at the earliest this is largely due to his glucose sensor in the right arm. We have been using silver alginate. There has been an improvement 10/29; I am still not exactly sure when his MRI is booked for. He says it is the third but it is the 10th in epic. This definitely needs to be done. He is running a low-grade fever today but no other symptoms.  No real improvement in the 1 02/26/2019 patient presents today for a follow-up visit here in our clinic he is last been seen in the clinic on October 29. Subsequently we were working on getting MRI to evaluate and see what exactly was going on and where we would need to go from the standpoint of whether or not he had osteomyelitis and again what treatments were going be required. Subsequently the patient ended up being admitted to the hospital on 02/07/2019 and was discharged on 02/14/2019. This is a somewhat interesting admission with a  discharge diagnosis of pneumonia due to COVID-19 although he was positive for COVID-19 when tested at the urgent care but negative x2 when he was actually in the hospital. With that being said he did have acute respiratory failure with hypoxia and it was noted he also have a left foot ulceration with osteomyelitis. With that being said he did require oxygen for his pneumonia and I level 4 L. He was placed on antivirals and steroids for the COVID-19. He was also transferred to the [facility] at one point. Nonetheless he did subsequently discharged home and since being home has done much better in that regard. The CT angiogram did not show any pulmonary embolism. With regard to the osteomyelitis the patient was placed on vancomycin and Zosyn while in the hospital but has been changed to Augmentin at discharge. It was also recommended that he follow- up with wound care and podiatry. Podiatry however wanted him to see [name] according to the patient prior to them doing anything further. His hemoglobin A1c was 9.9 as noted in the hospital. Have an MRI of the left foot performed while in the hospital on 02/04/2019. 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 There is an overlying plantar open wound noted an abscess tracking back along the lateral aspect of the fifth metatarsal shaft. There is otherwise diffuse cellulitis and mild fasciitis without findings of polymyositis. The patient did have recently pneumonia secondary to COVID-19 I looked in the chart through epic and it does appear that the patient may need to have an additional x-ray just to ensure everything is cleared and that he has no airspace disease prior to putting him into the chamber. 03/05/2019; patient was readmitted to the clinic last week. He was hospitalized twice for a viral upper respiratory tract infection from 11/1 through 11/4 and  then 11/5 through 11/12 ultimately this turned out to be Covid pneumonitis. Although he was discharged on oxygen he is not using it. He says he feels fine. He has no exercise limitation no cough no sputum. His O2 sat in our clinic today was 100% on room air. 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 He received Vanco and Zosyn in the hospital and then was discharged on 2 weeks of Augmentin. I do not see any relevant cultures. He was supposed to follow-up with infectious disease but I do not see that he has an appointment. 12/8; patient saw Dr. Novella Olive of infectious disease last week. He felt that he had had adequate antibiotic therapy. He did not go to follow-up with Dr. Amalia Hailey of podiatry and I have again talked to him about the pros and cons of this. He does not want to consider a ray amputation of this time. He is aware of  the risks of recurrence, migration etc. He started HBO today and tolerated this well. He can complete the Augmentin that I gave him last week. I have looked over the lab work that Dr. Chana Bode ordered his C-reactive protein was 3.3 and his sedimentation rate was 17. The C-reactive protein is never really been measurably that high in this patient 12/15; not much change in the wound today however he has undermining along the lateral part of the foot again more extensively than last week. He has some rims of epithelialization. We have been using silver alginate. He is undergoing hyperbarics but did not dive today 12/18; in for his obligatory first total contact cast change. Unfortunately there was pus coming from the undermining area around his fifth metatarsal head. This was cultured but will preclude reapplication of a cast. He is seen in conjunction with HBO 12/24; patient had staph lugdunensis in the wound in the undermining area laterally last time. We put him on  doxycycline which should have covered this. The wound looks better today. I am going to give him another week of doxycycline before reattempting the total contact cast 12/31; the patient is completing antibiotics. Hemorrhagic debris in the distal part of the wound with some undermining distally. He also had hyper granulation. Extensive debridement with a #5 curette. The infected area that was on the lateral part of the fifth met head is closed over. I do not think he needs any more antibiotics. Patient was seen prior to HBO. Preparations for a total contact cast were made in the cast will be placed post hyperbarics 04/11/19; once again the patient arrives today without complaint. He had been in a cast all week noted that he had heavy drainage this week. This resulted in large raised areas of macerated tissue around the wound 1/14; wound bed looks better slightly smaller. Hydrofera Blue has been changing himself. He had a heavy drainage last week which caused a lot of maceration around the wound so I took him out of a total contact cast he says the drainage is actually better this week He is seen today in conjunction with HBO 1/21; returns to clinic. He was up in [place] for a day or 2 attending a funeral. He comes back in with the wound larger and with a large area of exposed bone. He had osteomyelitis and septic arthritis of the fifth left metatarsal head while he was in hospital. He received IV antibiotics in the hospital for a prolonged period of time then 3 weeks of Augmentin. Subsequently I gave him 2 weeks of doxycycline for more superficial wound infection. When I saw this last week the wound was smaller the surface of the wound looks satisfactory. 1/28; patient missed hyperbarics today. Bone biopsy I did last time showed Enterococcus faecalis and Staphylococcus lugdunensis . He has a wide area of exposed bone. We are going to use silver alginate as of today. I had another ethical discussion  with the patient. 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 In this situation I think the likelihood of healing this is low. Therefore I have recommended a ray amputation and with the patient's agreement I have referred him to Dr. Doran Durand. The other issue is that his compliance with hyperbarics has been minimal because of his work schedule and given his underlying decision I am going to stop this today READMISSION 10/24/2019 MRI 09/29/2019 left foot IMPRESSION: 1. Apparent skin ulceration inferior and lateral to the 5th metatarsal base with underlying  heterogeneous T2 signal and enhancement in the subcutaneous fat. Small peripherally enhancing fluid collections along the plantar and lateral aspects of the 5th metatarsal base suspicious for abscesses. 2. Interval amputation through the mid 5th metatarsal with nonspecific low-level marrow edema and enhancement. Given the proximity to the adjacent soft tissue inflammatory changes, osteomyelitis cannot be excluded. 3. The additional bones appear unremarkable. MRI 09/29/2019 right foot IMPRESSION: 1. Soft tissue ulceration lateral to the 5th MTP joint. There is low-level T2 hyperintensity within the 4th and 5th metatarsal heads and adjacent proximal phalanges without abnormal T1 signal or cortical destruction. These findings are nonspecific and could be seen with early marrow edema, hyperemia or early osteomyelitis. No evidence of septic joint. 2. Mild tenosynovitis and synovial enhancement associated with the extensor digitorum tendons at the level of the midfoot. 3. Diffuse low-level muscular T2 hyperintensity and enhancement, most consistent with diabetic myopathy. LEFT FOOT BONE Methicillin resistant staphylococcus aureus Staphylococcus lugdunensis MIC MIC CIPROFLOXACIN >=8 RESISTANT Resistant <=0.5 SENSI... Sensitive CLINDAMYCIN <=0.25 SENS... Sensitive >=8 RESISTANT Resistant ERYTHROMYCIN >=8  RESISTANT Resistant >=8 RESISTANT Resistant GENTAMICIN <=0.5 SENSI... Sensitive <=0.5 SENSI... Sensitive Inducible Clindamycin NEGATIVE Sensitive NEGATIVE Sensitive OXACILLIN >=4 RESISTANT Resistant 2 SENSITIVE Sensitive RIFAMPIN <=0.5 SENSI... Sensitive <=0.5 SENSI... Sensitive TETRACYCLINE <=1 SENSITIVE Sensitive <=1 SENSITIVE Sensitive TRIMETH/SULFA <=10 SENSIT Sensitive <=10 SENSIT Sensitive ... Marland Kitchen.. VANCOMYCIN 1 SENSITIVE Sensitive <=0.5 SENSI... Sensitive Right foot bone . Component 3 wk ago Specimen Description BONE Special Requests RIGHT 4 METATARSAL SAMPLE B Gram Stain NO WBC SEEN NO ORGANISMS SEEN Culture RARE METHICILLIN RESISTANT STAPHYLOCOCCUS AUREUS NO ANAEROBES ISOLATED Performed at [facility], [place] [address], [place], [place] [address] Report Status 10/08/2019 FINAL Organism ID, Bacteria METHICILLIN RESISTANT STAPHYLOCOCCUS AUREUS Resulting Agency [facility] Susceptibility Methicillin resistant staphylococcus aureus MIC CIPROFLOXACIN >=8 RESISTANT Resistant CLINDAMYCIN <=0.25 SENS... Sensitive ERYTHROMYCIN >=8 RESISTANT Resistant GENTAMICIN <=0.5 SENSI... Sensitive Inducible Clindamycin NEGATIVE Sensitive OXACILLIN >=4 RESISTANT Resistant RIFAMPIN <=0.5 SENSI... Sensitive TETRACYCLINE <=1 SENSITIVE Sensitive TRIMETH/SULFA <=10 SENSIT Sensitive ... VANCOMYCIN 1 SENSITIVE Sensitive This is a patient we had in clinic earlier this year with a wound over his left fifth metatarsal head. 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 Doran Durand on 05/16/2019. He seems to have developed problems with wounds on his bilateral feet in June 2021 although he may have had problems earlier than  this. He was in an urgent care with a right foot ulcer on 09/26/2019 and given a course of doxycycline. This was apparently after having trouble getting into see orthopedics. He was seen by podiatry on 09/28/2019 noted to have bilateral lower extremity ulcers including the left lateral fifth metatarsal base and the right subfifth met head. It was noted that had purulent drainage at that time. He required hospitalization from 6/20 through 7/2. This was because of worsening right foot wounds. He underwent bilateral operative incision and drainage and bone biopsies bilaterally. Culture results are listed above. He has been referred back to clinic by Dr. Jacqualyn Posey of podiatry. He is also followed by Dr. Megan Salon who saw him yesterday. He was discharged from hospital on Zyvox Flagyl and Levaquin and yesterday changed to doxycycline Flagyl and Levaquin. His inflammatory markers on 6/26 showed a sedimentation rate of 129 and a C-reactive protein of  5. This is improved to 14 and 1.3 respectively. This would indicate improvement. ABIs in our clinic today were 1.23 on the right and 1.20 on the left 11/01/2019 on evaluation today patient appears to be doing fairly well in regard to the wounds on his feet at this point. Fortunately there is no signs of active infection at this time. No fevers, chills, nausea, vomiting, or diarrhea. He currently is seeing infectious disease and still under their care at this point. Subsequently he also has both wounds which she has not been using collagen on as he did not receive that in his packaging he did not call us and let us know that. Apparently that just was missed on the order. Nonetheless we will get that straightened out today. 8/9-Patient returns for bilateral foot wounds, using Prisma with hydrogel moistened dressings, and the wounds appear stable. Patient using surgical shoes, avoiding much pressure or weightbearing as much as possible Electronic Signature(s) Signed:  11/11/2019 11:18:53 AM By: Tobi Bastos MD, MBA Entered By: Tobi Bastos on 11/11/2019 11:18:52 -------------------------------------------------------------------------------- Chemical Cauterization Details Patient Name: Date of Service: [name], [name] 11/11/2019 11:00 A M Medical Record Number: [id number] Patient Account Number: [id number] Date of Birth/Sex: Treating RN: [birth year] (34 y.o. [name] Primary Care Provider: [name] Other Clinician: Referring Provider: Treating Provider/Extender: Warren Danes Weeks in Treatment: 2 Procedure Performed for: Wound #2 Right,Lateral Foot Performed By: Physician Tobi Bastos, MD Post Procedure Diagnosis Same as Pre-procedure Notes silver nitrate Electronic Signature(s) Signed: 11/11/2019 4:24:36 PM By: Tobi Bastos MD, MBA Signed: 11/11/2019 5:19:36 PM By: [name] RN, BSN Entered By: [name] on 11/11/2019 11:17:59 -------------------------------------------------------------------------------- Physical Exam Details Patient Name: Date of Service: [name], [name] 11/11/2019 11:00 A M Medical Record Number: [id number] Patient Account Number: [id number] Date of Birth/Sex: Treating RN: November 04, 1985 (34 y.o. [name] Primary Care Provider: [name] Other Clinician: Referring Provider: Treating Provider/Extender: Warren Danes Weeks in Treatment: 2 Constitutional alert and oriented x 3. sitting or standing blood pressure is within target range for patient.. supine blood pressure is within target range for patient.. pulse regular and within target range for patient.Marland Kitchen respirations regular, non-labored and within target range for patient.Marland Kitchen temperature within target range for patient.. . . Well- nourished and well-hydrated in no acute distress. Notes Left lateral foot wound in the right lateral foot wound look healthy with granulation tissue especially  on the right there is hyper granulation surrounding skin is intact with no maceration noted Electronic Signature(s) Signed: 11/11/2019 11:19:35 AM By: Tobi Bastos MD, MBA Entered By: Tobi Bastos on 11/11/2019 11:19:34 -------------------------------------------------------------------------------- Physician Orders Details Patient Name: Date of Service: [name], [name] 11/11/2019 11:00 A M Medical Record Number: [id number] Patient Account Number: [id number] Date of Birth/Sex: Treating RN: [birth year] (34 y.o. 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 Dressing Change Frequency  Wound #2 Right,Lateral Foot Change Dressing every other day. Wound #3 Left,Lateral Foot Change Dressing every other day. Skin Barriers/Peri-Wound Care Moisturizing lotion - both legs and feet Wound Cleansing May shower and wash wound with soap and water. - on days that dressing is changed Primary Wound Dressing Wound #2 Right,Lateral Foot Silver Collagen - moisten with hydrogel or KY jelly Wound #3 Left,Lateral Foot Silver Collagen - moisten with hydrogel or KY jelly Secondary Dressing Wound #2 Right,Lateral Foot Foam - foam donut Kerlix/Rolled Gauze Dry Gauze Wound #3  Left,Lateral Foot Foam - foam donut Kerlix/Rolled Gauze Dry Gauze Off-Loading Open toe surgical shoe to: - both Museum/gallery curator) Signed: 11/11/2019 4:24:36 PM By: Tobi Bastos MD, MBA Signed: 11/11/2019 5:19:36 PM By: [name] RN, BSN Entered By: [name] on 11/11/2019 11:18:24 -------------------------------------------------------------------------------- Problem List Details Patient Name: Date of Service: [name], [name] 11/11/2019 11:00 A M Medical Record Number: [id number] Patient Account Number: [id number] Date of Birth/Sex: Treating RN: 01-04-1986 (34 y.o. 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, left ankle and foot 10/24/2019 No Yes B95.62 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 10/24/2019 No Yes classified elsewhere Inactive Problems Resolved Problems Electronic Signature(s) Signed: 11/11/2019 4:24:36 PM By: Tobi Bastos MD, MBA Signed: 11/11/2019 5:19:36 PM By: [name] RN, BSN Entered By: [name] on 11/11/2019 11:10:11 -------------------------------------------------------------------------------- Progress Note Details Patient Name: Date of Service: [name], [name] 11/11/2019 11:00 A M Medical Record Number: [id number] Patient Account Number: [id number] Date of Birth/Sex:  Treating RN: 1985-12-26 (34 y.o. 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 He comes in for review of a wound over the plantar fifth metatarsal head extending into the lateral part of the foot. He was followed for this previously by his podiatrist Dr. Cornelius Moras. As the patient tells his story he went to see podiatry first for a swelling he developed on the lateral part of his fifth metatarsal head in May. He states this was "open" by podiatry and the area closed. He was followed up in June and it was again opened callus removed and it closed promptly. There were plans being made for surgery on the fifth metatarsal head in June however his blood sugar was apparently too high for anesthesia. Apparently the area was debrided and opened again in June and it is never closed since. Looking over the records from podiatry I am really not able to follow this. It was clear when he was first seen it was before 5/14 at that point he already had a wound. By 5/17 the ulcer was resolved. I do not see anything about a procedure. On 5/28 noted to have pre-ulcerative moderate keratosis. X-ray noted 1/5 contracted toe and tailor's bunion and metatarsal deformity. On  a visit date on 09/28/2018 the dorsal part of the left foot it healed and resolved. There was concern about swelling in his lower extremity he was sent to the ER.. As far as I can tell he was seen in the ER on 7/12 with an ulcer on his left foot. A DVT rule out of the left leg was negative. I do not think I have complete records from podiatry but I am not able to verify the procedures this patient states he had. He states after the last procedure the wound has never closed  although I am not able to follow this in the records I have from podiatry. He has not had a recent x-ray The patient has been using Neosporin on the wound. He is wearing a Darco shoe. He is still very active up on his foot working and exercising. Past medical history; type 2 diabetes ketosis-prone, leg swelling with a negative DVT study in July. Non-smoker ABI in our clinic was 0.85 on the left 10/16; substantial wound on the plantar left fifth met head extending laterally almost to the dorsal fifth MTP. We have been using silver alginate we gave him a Darco forefoot off loader. An x-ray did not show evidence of osteomyelitis did note soft tissue emphysema which I think was due to gas tracking through an open wound. There is no doubt in my mind he requires an MRI 10/23; MRI not booked until 3 November at the earliest this is largely due to his glucose sensor in the right arm. We have been using silver alginate. There has been an improvement 10/29; I am still not exactly sure when his MRI is booked for. He says it is the third but it is the 10th in epic. This definitely needs to be done. He is running a low-grade fever today but no other symptoms. No real improvement in the 1 02/26/2019 patient presents today for a follow-up visit here in our clinic he is last been seen in the clinic on October 29. Subsequently we were working on getting MRI to evaluate and see what exactly was going on and where we would need to go from the standpoint of whether or not he had osteomyelitis and again what treatments were going be required. Subsequently the patient ended up being admitted to the hospital on 02/07/2019 and was discharged on 02/14/2019. This is a somewhat interesting admission with a discharge diagnosis of pneumonia due to COVID-19 although he was positive for COVID-19 when tested at the urgent care but negative x2 when he was actually in the hospital. With that being said he did have acute respiratory  failure with hypoxia and it was noted he also have a left foot ulceration with osteomyelitis. With that being said he did require oxygen for his pneumonia and I level 4 L. He was placed on antivirals and steroids for the COVID-19. He was also transferred to the [facility] at one point. Nonetheless he did subsequently discharged home and since being home has done much better in that regard. The CT angiogram did not show any pulmonary embolism. With regard to the osteomyelitis the patient was placed on vancomycin and Zosyn while in the hospital but has been changed to Augmentin at discharge. It was also recommended that he follow- up with wound care and podiatry. Podiatry however wanted him to see [name] according to the patient prior to them doing anything further. His hemoglobin A1c was 9.9 as noted in the hospital. Have an MRI of  the left foot performed while in the hospital on 02/04/2019. 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 There is an overlying plantar open wound noted an abscess tracking back along the lateral aspect of the fifth metatarsal shaft. There is otherwise diffuse cellulitis and mild fasciitis without findings of polymyositis. The patient did have recently pneumonia secondary to COVID-19 I looked in the chart through epic and it does appear that the patient may need to have an additional x-ray just to ensure everything is cleared and that he has no airspace disease prior to putting him into the chamber. 03/05/2019; patient was readmitted to the clinic last week. He was hospitalized twice for a viral upper respiratory tract infection from 11/1 through 11/4 and then 11/5 through 11/12 ultimately this turned out to be Covid pneumonitis. Although he was discharged on oxygen he is not using it. He says he feels fine. He has no exercise limitation no cough no sputum. His O2 sat in our  clinic today was 100% on room air. 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 He received Vanco and Zosyn in the hospital and then was discharged on 2 weeks of Augmentin. I do not see any relevant cultures. He was supposed to follow-up with infectious disease but I do not see that he has an appointment. 12/8; patient saw Dr. Novella Olive of infectious disease last week. He felt that he had had adequate antibiotic therapy. He did not go to follow-up with Dr. Amalia Hailey of podiatry and I have again talked to him about the pros and cons of this. He does not want to consider a ray amputation of this time. He is aware of the risks of recurrence, migration etc. He started HBO today and tolerated this well. He can complete the Augmentin that I gave him last week. I have looked over the lab work that Dr. Chana Bode ordered his C-reactive protein was 3.3 and his sedimentation rate was 17. The C-reactive protein is never really been measurably that high in this patient 12/15; not much change in the wound today however he has undermining along the lateral part of the foot again more extensively than last week. He has some rims of epithelialization. We have been using silver alginate. He is undergoing hyperbarics but did not dive today 12/18; in for his obligatory first total contact cast change. Unfortunately there was pus coming from the undermining area around his fifth metatarsal head. This was cultured but will preclude reapplication of a cast. He is seen in conjunction with HBO 12/24; patient had staph lugdunensis in the wound in the undermining area laterally last time. We put him on doxycycline which should have covered this. The wound looks better today. I am going to give him another week of doxycycline before reattempting the total contact cast 12/31; the patient is completing antibiotics. Hemorrhagic  debris in the distal part of the wound with some undermining distally. He also had hyper granulation. Extensive debridement with a #5 curette. The infected area that was on the lateral part of the fifth met head is closed over. I do not think he needs any more antibiotics. Patient was seen prior to HBO. Preparations for a total contact cast were made in the cast will be placed post hyperbarics 04/11/19; once again the patient arrives today without complaint. He had  been in a cast all week noted that he had heavy drainage this week. This resulted in large raised areas of macerated tissue around the wound 1/14; wound bed looks better slightly smaller. Hydrofera Blue has been changing himself. He had a heavy drainage last week which caused a lot of maceration around the wound so I took him out of a total contact cast he says the drainage is actually better this week He is seen today in conjunction with HBO 1/21; returns to clinic. He was up in [place] for a day or 2 attending a funeral. He comes back in with the wound larger and with a large area of exposed bone. He had osteomyelitis and septic arthritis of the fifth left metatarsal head while he was in hospital. He received IV antibiotics in the hospital for a prolonged period of time then 3 weeks of Augmentin. Subsequently I gave him 2 weeks of doxycycline for more superficial wound infection. When I saw this last week the wound was smaller the surface of the wound looks satisfactory. 1/28; patient missed hyperbarics today. Bone biopsy I did last time showed Enterococcus faecalis and Staphylococcus lugdunensis . He has a wide area of exposed bone. We are going to use silver alginate as of today. I had another ethical discussion with the patient. 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 In this situation I think the likelihood of healing this is low. Therefore I have recommended a ray amputation and with the patient's  agreement I have referred him to Dr. Doran Durand. The other issue is that his compliance with hyperbarics has been minimal because of his work schedule and given his underlying decision I am going to stop this today READMISSION 10/24/2019 MRI 09/29/2019 left foot IMPRESSION: 1. Apparent skin ulceration inferior and lateral to the 5th metatarsal base with underlying heterogeneous T2 signal and enhancement in the subcutaneous fat. Small peripherally enhancing fluid collections along the plantar and lateral aspects of the 5th metatarsal base suspicious for abscesses. 2. Interval amputation through the mid 5th metatarsal with nonspecific low-level marrow edema and enhancement. Given the proximity to the adjacent soft tissue inflammatory changes, osteomyelitis cannot be excluded. 3. The additional bones appear unremarkable. MRI 09/29/2019 right foot IMPRESSION: 1. Soft tissue ulceration lateral to the 5th MTP joint. There is low-level T2 hyperintensity within the 4th and 5th metatarsal heads and adjacent proximal phalanges without abnormal T1 signal or cortical destruction. These findings are nonspecific and could be seen with early marrow edema, hyperemia or early osteomyelitis. No evidence of septic joint. 2. Mild tenosynovitis and synovial enhancement associated with the extensor digitorum tendons at the level of the midfoot. 3. Diffuse low-level muscular T2 hyperintensity and enhancement, most consistent with diabetic myopathy. LEFT FOOT BONE Methicillin resistant staphylococcus aureus Staphylococcus lugdunensis MIC MIC CIPROFLOXACIN >=8 RESISTANT Resistant <=0.5 SENSI... Sensitive CLINDAMYCIN <=0.25 SENS... Sensitive >=8 RESISTANT Resistant ERYTHROMYCIN >=8 RESISTANT Resistant >=8 RESISTANT Resistant GENTAMICIN <=0.5 SENSI... Sensitive <=0.5 SENSI... Sensitive Inducible Clindamycin NEGATIVE Sensitive NEGATIVE Sensitive OXACILLIN >=4 RESISTANT Resistant 2 SENSITIVE Sensitive RIFAMPIN  <=0.5 SENSI... Sensitive <=0.5 SENSI... Sensitive TETRACYCLINE <=1 SENSITIVE Sensitive <=1 SENSITIVE Sensitive TRIMETH/SULFA <=10 SENSIT Sensitive <=10 SENSIT Sensitive ... Marland Kitchen.. VANCOMYCIN 1 SENSITIVE Sensitive <=0.5 SENSI... Sensitive Right foot bone . Component 3 wk ago Specimen Description BONE Special Requests RIGHT 4 METATARSAL SAMPLE B Gram Stain NO WBC SEEN NO ORGANISMS SEEN Culture RARE METHICILLIN RESISTANT STAPHYLOCOCCUS AUREUS NO ANAEROBES ISOLATED Performed at [facility], [place] [address], [place], [place]  [id number] Report Status 10/08/2019 FINAL Organism ID, Bacteria METHICILLIN RESISTANT STAPHYLOCOCCUS AUREUS Resulting Agency [facility] Susceptibility Methicillin resistant staphylococcus aureus MIC CIPROFLOXACIN >=8 RESISTANT Resistant CLINDAMYCIN <=0.25 SENS... Sensitive ERYTHROMYCIN >=8 RESISTANT Resistant GENTAMICIN <=0.5 SENSI... Sensitive Inducible Clindamycin NEGATIVE Sensitive OXACILLIN >=4 RESISTANT Resistant RIFAMPIN <=0.5 SENSI... Sensitive TETRACYCLINE <=1 SENSITIVE Sensitive TRIMETH/SULFA <=10 SENSIT Sensitive ... VANCOMYCIN 1 SENSITIVE Sensitive This is a patient we had in clinic earlier this year with a wound over his left fifth metatarsal head. 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 Doran Durand on 05/16/2019. He seems to have developed problems with wounds on his bilateral feet in June 2021 although he may have had problems earlier than this. He was in an urgent care with a right foot ulcer on 09/26/2019 and given a course of doxycycline. This was apparently after having trouble getting into see orthopedics. He was seen by podiatry on 09/28/2019 noted to have bilateral  lower extremity ulcers including the left lateral fifth metatarsal base and the right subfifth met head. It was noted that had purulent drainage at that time. He required hospitalization from 6/20 through 7/2. This was because of worsening right foot wounds. He underwent bilateral operative incision and drainage and bone biopsies bilaterally. Culture results are listed above. He has been referred back to clinic by Dr. Jacqualyn Posey of podiatry. He is also followed by Dr. Megan Salon who saw him yesterday. He was discharged from hospital on Zyvox Flagyl and Levaquin and yesterday changed to doxycycline Flagyl and Levaquin. His inflammatory markers on 6/26 showed a sedimentation rate of 129 and a C-reactive protein of 5. This is improved to 14 and 1.3 respectively. This would indicate improvement. ABIs in our clinic today were 1.23 on the right and 1.20 on the left 11/01/2019 on evaluation today patient appears to be doing fairly well in regard to the wounds on his feet at this point. Fortunately there is no signs of active infection at this time. No fevers, chills, nausea, vomiting, or diarrhea. He currently is seeing infectious disease and still under their care at this point. Subsequently he also has both wounds which she has not been using collagen on as he did not receive that in his packaging he did not call us and let us know that. Apparently that just was missed on the order. Nonetheless we will get that straightened out today. 8/9-Patient returns for bilateral foot wounds, using Prisma with hydrogel moistened dressings, and the wounds appear stable. Patient using surgical shoes, avoiding much pressure or weightbearing as much as possible Objective Constitutional alert and oriented x 3. sitting or standing blood pressure is within target range for patient.. supine blood pressure is within target range for patient.. pulse regular and within target range for patient.Marland Kitchen respirations regular, non-labored and  within target range for patient.Marland Kitchen temperature within target range for patient.. Well- nourished and well-hydrated in no acute distress. Vitals Time Taken: 10:58 AM, Height: 77 in, Weight: 280 lbs, BMI: 33.2, Temperature: 98.0 F, Pulse: 108 bpm, Respiratory Rate: 18 breaths/min, Blood Pressure: 124/84 mmHg. General Notes: Left lateral foot wound in the right lateral foot wound look healthy with granulation tissue especially on the right there is hyper granulation surrounding skin is intact with no maceration noted Integumentary (Hair, Skin)  Wound #2 status is Open. Original cause of wound was Gradually Appeared. The wound is located on the Right,Lateral Foot. The wound measures 2cm length x 2cm width x 0.1cm depth; 3.142cm^2 area and 0.314cm^3 volume. There is Fat Layer (Subcutaneous Tissue) Exposed exposed. There is no tunneling or undermining noted. There is a medium amount of serosanguineous drainage noted. The wound margin is flat and intact. There is large (67-100%) red granulation within the wound bed. There is a small (1-33%) amount of necrotic tissue within the wound bed including Adherent Slough. Wound #3 status is Open. Original cause of wound was Trauma. The wound is located on the Left,Lateral Foot. The wound measures 0.9cm length x 0.7cm width x 0.1cm depth; 0.495cm^2 area and 0.049cm^3 volume. There is Fat Layer (Subcutaneous Tissue) Exposed exposed. There is no tunneling or undermining noted. There is a medium amount of serosanguineous drainage noted. The wound margin is flat and intact. There is large (67-100%) red granulation within the wound bed. There is no necrotic tissue within the wound bed. 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 An Chemical Cauterization procedure was performed by Tobi Bastos, MD. Post procedure Diagnosis Wound #2: Same as Pre-Procedure Notes: silver nitrate Plan Follow-up Appointments: Return Appointment in 1 week. Dressing Change Frequency: Wound #2 Right,Lateral Foot: Change Dressing every other day. Wound #3 Left,Lateral Foot: Change Dressing every other day. Skin Barriers/Peri-Wound Care: Moisturizing lotion - both legs and feet Wound Cleansing: May shower and wash wound with soap and water. - on days that dressing is changed Primary Wound Dressing: Wound #2 Right,Lateral Foot: Silver Collagen - moisten with hydrogel or KY jelly Wound #3 Left,Lateral Foot: Silver Collagen - moisten with hydrogel or KY jelly Secondary Dressing: Wound #2 Right,Lateral Foot: Foam - foam donut Kerlix/Rolled Gauze Dry Gauze Wound #3 Left,Lateral Foot: Foam - foam donut Kerlix/Rolled Gauze Dry Gauze Off-Loading: Open toe surgical shoe to: - both feet --Continue with current dressing with Prisma and hydrogel -Silver nitrate application to the right foot wound for the hypergranulation -Return to clinic next week -Note given to restrict weightbearing and ambulation on account of both foot wounds. Electronic Signature(s) Signed: 11/11/2019 11:20:32 AM By: Tobi Bastos MD, MBA Entered By: Tobi Bastos on 11/11/2019 11:20:32 -------------------------------------------------------------------------------- SuperBill Details Patient Name: Date of Service: [name], [name] 11/11/2019 Medical Record Number: [id number] Patient Account Number: [id number] Date of Birth/Sex: Treating  RN: October 12, 1985 (34 y.o. [name] Primary Care Provider: [name] Other Clinician: Referring Provider: Treating Provider/Extender: Warren Danes Weeks in Treatment: 2 Diagnosis Coding ICD-10 Codes Code Description [phone] Type 2 diabetes mellitus with foot ulcer L97.518 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ity L97.528 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ity 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, right ankle and foot 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, left ankle and foot B95.62 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 classified elsewhere Facility Procedures CPT4 Code: 55732202 Description: 99214 - WOUND CARE VISIT-LEV  4 EST PT Modifier: Quantity: 1 Physician Procedures : CPT4 Code Description Modifier [id number] - WC PHYS LEVEL 3 - EST PT ICD-10 Diagnosis Description E11.621 Type 2 diabetes mellitus with foot ulcer Quantity: 1 : [id number] - WC PHYS CHEM CAUT GRAN TISSUE ICD-10 Diagnosis Description L97.518 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ity Quantity: 1 Electronic Signature(s) Signed: 11/11/2019 5:19:36 PM By: [name] RN, BSN Signed: 11/12/2019 4:52:06 PM By: Tobi Bastos MD, MBA Previous Signature: 11/11/2019 4:24:36 PM Version By: Tobi Bastos MD, MBA Previous Signature: 11/11/2019 11:20:49 AM Version By: Tobi Bastos MD, MBA Entered By: [name] on 11/11/2019 16:36:50

## 2019-11-12 NOTE — Progress Notes (Signed)
[place], [name] ([id number]) Visit Report for 11/11/2019 Arrival Information Details Patient Name: Date of Service: [name] 11/11/2019 11:00 A M Medical Record Number: [id number] Patient Account Number: [id number] Date of Birth/Sex: Treating RN: 1985/05/14 (34 y.o. [name] Primary Care Glessie Eustice: Katy Apo Other Clinician: Referring Audrielle Vankuren: Treating Gustabo Gordillo/Extender: Armen Pickup in Treatment: 2 Visit Information History Since Last Visit Added or deleted any medications: Yes Patient Arrived: Ambulatory Any new allergies or adverse reactions: No Arrival Time: 10:54 Had a fall or experienced change in No Accompanied By: self activities of daily living that may affect Transfer Assistance: None risk of falls: Patient Identification Verified: Yes Signs or symptoms of abuse/neglect since No Secondary Verification Process Completed: Yes last visito Patient Requires Transmission-Based Precautions: No Hospitalized since last visit: No Patient Has Alerts: No Implantable device outside of the clinic No excluding cellular tissue based products placed in the center since last visit: Has Dressing in Place as Prescribed: Yes Has Footwear/Offloading in Place as Yes Prescribed: Left: Surgical Shoe with Pressure Relief Insole Right: Surgical Shoe with Pressure Relief Insole Pain Present Now: No Electronic Signature(s) Signed: 11/11/2019 4:51:12 PM By: Zenaida Deed RN, BSN Entered By: Zenaida Deed on 11/11/2019 10:55:00 -------------------------------------------------------------------------------- Clinic Level of Care Assessment Details Patient Name: Date of Service: [name] 11/11/2019 11:00 A M Medical Record Number: [id number] Patient Account Number: [id number] Date of Birth/Sex: Treating RN: April 07, 1985 (34 y.o. [name] Primary Care Ebb Carelock: Katy Apo Other Clinician: Referring Oak Dorey: Treating  Raiford Fetterman/Extender: Armen Pickup in Treatment: 2 Clinic Level of Care Assessment Items TOOL 4 Quantity Score X- 1 0 Use when only an EandM is performed on FOLLOW-UP visit ASSESSMENTS - Nursing Assessment / Reassessment X- 1 10 Reassessment of Co-morbidities (includes updates in patient status) X- 1 5 Reassessment of Adherence to Treatment Plan ASSESSMENTS - Wound and Skin A ssessment / Reassessment []  - 0 Simple Wound Assessment / Reassessment - one wound X- 2 5 Complex Wound Assessment / Reassessment - multiple wounds []  - 0 Dermatologic / Skin Assessment (not related to wound area) ASSESSMENTS - Focused Assessment []  - 0 Circumferential Edema Measurements - multi extremities []  - 0 Nutritional Assessment / Counseling / Intervention X- 1 5 Lower Extremity Assessment (monofilament, tuning fork, pulses) []  - 0 Peripheral Arterial Disease Assessment (using hand held doppler) ASSESSMENTS - Ostomy and/or Continence Assessment and Care []  - 0 Incontinence Assessment and Management []  - 0 Ostomy Care Assessment and Management (repouching, etc.) PROCESS - Coordination of Care X - Simple Patient / Family Education for ongoing care 1 15 []  - 0 Complex (extensive) Patient / Family Education for ongoing care X- 1 10 Staff obtains , Records, T Results / Process Orders est []  - 0 Staff telephones HHA, Nursing Homes / Clarify orders / etc []  - 0 Routine Transfer to another Facility (non-emergent condition) []  - 0 Routine Hospital Admission (non-emergent condition) []  - 0 New Admissions / / Ordering NPWT Apligraf, etc. , []  - 0 Emergency Hospital Admission (emergent condition) X- 1 10 Simple Discharge Coordination []  - 0 Complex (extensive) Discharge Coordination PROCESS - Special Needs []  - 0 Pediatric / Minor Patient Management []  - 0 Isolation Patient Management []  - 0 Hearing / Language / Visual special  needs []  - 0 Assessment of Community assistance (transportation, [name]/C planning, etc.) []  - 0 Additional assistance / Altered mentation []  - 0 Support Surface(s) Assessment (bed, cushion,  seat, etc.) INTERVENTIONS - Wound Cleansing / Measurement []  - 0 Simple Wound Cleansing - one wound X- 2 5 Complex Wound Cleansing - multiple wounds X- 1 5 Wound Imaging (photographs - any number of wounds) []  - 0 Wound Tracing (instead of photographs) []  - 0 Simple Wound Measurement - one wound X- 2 5 Complex Wound Measurement - multiple wounds INTERVENTIONS - Wound Dressings X - Small Wound Dressing one or multiple wounds 2 10 []  - 0 Medium Wound Dressing one or multiple wounds []  - 0 Large Wound Dressing one or multiple wounds X- 1 5 Application of Medications - topical []  - 0 Application of Medications - injection INTERVENTIONS - Miscellaneous []  - 0 External ear exam []  - 0 Specimen Collection (cultures, biopsies, blood, body fluids, etc.) []  - 0 Specimen(s) / Culture(s) sent or taken to Lab for analysis []  - 0 Patient Transfer (multiple staff / / Similar devices) []  - 0 Simple Staple / Suture removal (25 or less) []  - 0 Complex Staple / Suture removal (26 or more) []  - 0 Hypo / Hyperglycemic Management (close monitor of Blood Glucose) []  - 0 Ankle / Brachial Index (ABI) - do not check if billed separately X- 1 5 Vital Signs Has the patient been seen at the hospital within the last three years: Yes Total Score: 120 Level Of Care: New/Established - Level 4 Electronic Signature(s) Signed: 11/11/2019 5:19:36 PM By: RN, BSN Entered By: on 11/11/2019 16:01:16 -------------------------------------------------------------------------------- Lower Extremity Assessment Details Patient Name: Date of Service: , [name] 11/11/2019 11:00 A M Medical Record Number: Patient Account Number: Date of Birth/Sex: Treating  RN: [birth year] (34 y.o. Primary Care Cortlyn Cannell: Other Clinician: Referring Lasha Echeverria: Treating Braian Tijerina/Extender: Weeks in Treatment: 2 Edema Assessment Assessed: [Left: No] [Right: No] Edema: [Left: No] [Right: No] Calf Left: Right: Point of Measurement: 48 cm From Medial Instep 45 cm 44 cm Ankle Left: Right: Point of Measurement: 10 cm From Medial Instep 28 cm 32 cm Vascular Assessment Pulses: Dorsalis Pedis Palpable: [Left:Yes] [Right:Yes] Electronic Signature(s) Signed: 11/11/2019 5:19:36 PM By: 01/11/2020 RN, BSN Entered By: Zandra Abts on 11/11/2019 11:08:17 -------------------------------------------------------------------------------- Multi-Disciplinary Care Plan Details Patient Name: Date of Service: 01/11/2020, [name] 11/11/2019 11:00 A M Medical Record Number: 01/11/2020 Patient Account Number: [id number] Date of Birth/Sex: Treating RN: Aug 12, 1985 (34 y.o. 32 Primary Care Hollie Bartus: [name] Other Clinician: Referring Niyana Chesbro: Treating Capucine Tryon/Extender: Katy Apo in Treatment: 2 Active Inactive Nutrition Nursing Diagnoses: Imbalanced nutrition Potential for alteratiion in Nutrition/Potential for imbalanced nutrition Goals: Patient/caregiver agrees to and verbalizes understanding of need to use nutritional supplements and/or vitamins as prescribed Date Initiated: 10/24/2019 Target Resolution Date: 11/22/2019 Goal Status: Active Patient/caregiver will maintain therapeutic glucose control Date Initiated: 10/24/2019 Target Resolution Date: 11/22/2019 Goal Status: Active Interventions: Assess HgA1c results as ordered upon admission and as needed Assess patient nutrition upon admission and as needed per policy Provide education on elevated blood sugars and impact on wound healing Provide education on nutrition Treatment Activities: Education  provided on Nutrition : 11/01/2019 Notes: Wound/Skin Impairment Nursing Diagnoses: Impaired tissue integrity Knowledge deficit related to ulceration/compromised skin integrity Goals: Patient/caregiver will verbalize understanding of skin care regimen Date Initiated: 10/24/2019 Target Resolution Date: 11/22/2019 Goal Status: Active Ulcer/skin breakdown will have a volume reduction of 30% by week 4 Date Initiated: 10/24/2019 Target Resolution Date: 11/22/2019 Goal  Status: Active Interventions: Assess patient/caregiver ability to obtain necessary supplies Assess patient/caregiver ability to perform ulcer/skin care regimen upon admission and as needed Assess ulceration(s) every visit Provide education on ulcer and skin care Notes: Electronic Signature(s) Signed: 11/11/2019 5:19:36 PM By: Zandra AbtsLynch, Shatara RN, BSN Entered By: Zandra AbtsLynch, Shatara on 11/11/2019 16:00:31 -------------------------------------------------------------------------------- Pain Assessment Details Patient Name: Date of Service: [name], [name] 11/11/2019 11:00 A M Medical Record Number: [id number] Patient Account Number: [id number] Date of Birth/Sex: Treating RN: 01/15/1986 (34 y.o. Max KollerM) Lynch, Shatara Primary Care Kolin Erdahl: Katy ApoPolite, Ronald [name] Other Clinician: Referring Koury Roddy: Treating Raychell Holcomb/Extender: Gilford RaidMadduri, Murthy [name], Ronald [name] Weeks in Treatment: 2 Active Problems Location of Pain Severity and Description of Pain Patient Has Paino No Site Locations Pain Management and Medication Current Pain Management: Electronic Signature(s) Signed: 11/11/2019 5:19:36 PM By: Zandra AbtsLynch, Shatara RN, BSN Signed: 11/12/2019 10:17:44 AM By: Karl Itoawkins, Destiny Entered By: Karl Itoawkins, Destiny on 11/11/2019 11:00:07 -------------------------------------------------------------------------------- Patient/Caregiver Education Details Patient Name: Date of Service: [name], [name] 8/9/2021andnbsp11:00 A M Medical Record Number:  [id number] Patient Account Number: [id number] Date of Birth/Gender: Treating RN: 01/15/1986 (34 y.o. Max KollerM) Lynch, Shatara Primary Care Physician: Katy ApoPolite, Ronald [name] Other Clinician: Referring Physician: Treating Physician/Extender: Armen PickupMadduri, Murthy [name], Ronald [name] Weeks in Treatment: 2 Education Assessment Education Provided To: Patient Education Topics Provided Wound/Skin Impairment: Methods: Explain/Verbal Responses: State content correctly Electronic Signature(s) Signed: 11/11/2019 5:19:36 PM By: Zandra AbtsLynch, Shatara RN, BSN Entered By: Zandra AbtsLynch, Shatara on 11/11/2019 16:00:46 -------------------------------------------------------------------------------- Wound Assessment Details Patient Name: Date of Service: [name], [name] 11/11/2019 11:00 A M Medical Record Number: [id number] Patient Account Number: [id number] Date of Birth/Sex: Treating RN: 01/15/1986 (34 y.o. Max KollerM) Lynch, Shatara Primary Care Hoorain Kozakiewicz: Katy ApoPolite, Ronald [name] Other Clinician: Referring Lorrene Graef: Treating Chancey Ringel/Extender: Gilford RaidMadduri, Murthy [name], Ronald [name] Weeks in Treatment: 2 Wound Status Wound Number: 2 Primary Etiology: Diabetic Wound/Ulcer of the Lower Extremity Wound Location: Right, Lateral Foot Wound Status: Open Wounding Event: Gradually Appeared Comorbid History: Type II Diabetes Date Acquired: 09/03/2019 Weeks Of Treatment: 2 Clustered Wound: No Wound Measurements Length: (cm) 2 Width: (cm) 2 Depth: (cm) 0.1 Area: (cm) 3.142 Volume: (cm) 0.314 % Reduction in Area: 43% % Reduction in Volume: 43% Epithelialization: Small (1-33%) Tunneling: No Undermining: No Wound Description Classification: Grade 2 Wound Margin: Flat and Intact Exudate Amount: Medium Exudate Type: Serosanguineous Exudate Color: red, brown Foul Odor After Cleansing: No Slough/Fibrino Yes Wound Bed Granulation Amount: Large (67-100%) Exposed Structure Granulation Quality: Red Fascia Exposed: No Necrotic Amount: Small  (1-33%) Fat Layer (Subcutaneous Tissue) Exposed: Yes Necrotic Quality: Adherent Slough Tendon Exposed: No Muscle Exposed: No Joint Exposed: No Bone Exposed: No Electronic Signature(s) Signed: 11/11/2019 5:19:36 PM By: Zandra AbtsLynch, Shatara RN, BSN Entered By: Zandra AbtsLynch, Shatara on 11/11/2019 11:09:21 -------------------------------------------------------------------------------- Wound Assessment Details Patient Name: Date of Service: [name], [name] 11/11/2019 11:00 A M Medical Record Number: [id number] Patient Account Number: [id number] Date of Birth/Sex: Treating RN: 01/15/1986 (34 y.o. Max KollerM) Lynch, Shatara Primary Care Deaglan Lile: Katy ApoPolite, Ronald [name] Other Clinician: Referring Sair Faulcon: Treating Batoul Limes/Extender: Gilford RaidMadduri, Murthy [name], Ronald [name] Weeks in Treatment: 2 Wound Status Wound Number: 3 Primary Etiology: Diabetic Wound/Ulcer of the Lower Extremity Wound Location: Left, Lateral Foot Wound Status: Open Wounding Event: Trauma Comorbid History: Type II Diabetes Date Acquired: 10/02/2019 Weeks Of Treatment: 2 Clustered Wound: No Wound Measurements Length: (cm) 0.9 Width: (cm) 0.7 Depth: (cm) 0.1 Area: (cm) 0.495 Volume: (cm) 0.049 Wound Description Classification: Grade 2 Wound Margin: Flat and Intact Exudate Amount: Medium Exudate Type: Serosanguineous  Exudate Color: red, brown Foul Odor After Cleansing: Slough/Fibrino % Reduction in Area: 70% % Reduction in Volume: 70.3% Epithelialization: Small (1-33%) Tunneling: No Undermining: No No Yes Wound Bed Granulation Amount: Large (67-100%) Exposed Structure Granulation Quality: Red Fascia Exposed: No Necrotic Amount: None Present (0%) Fat Layer (Subcutaneous Tissue) Exposed: Yes Tendon Exposed: No Muscle Exposed: No Joint Exposed: No Bone Exposed: No Electronic Signature(s) Signed: 11/11/2019 5:19:36 PM By: Zandra Abts RN, BSN Entered By: Zandra Abts on 11/11/2019  11:09:45 -------------------------------------------------------------------------------- Vitals Details Patient Name: Date of Service: Max Fick, [name] 11/11/2019 11:00 A M Medical Record Number: [id number] Patient Account Number: [id number] Date of Birth/Sex: Treating RN: 1986-02-04 (34 y.o. [name] Primary Care Durand Wittmeyer: Katy Apo Other Clinician: Referring Brix Brearley: Treating Azarya Oconnell/Extender: Gilford Raid Weeks in Treatment: 2 Vital Signs Time Taken: 10:58 Temperature (F): 98.0 Height (in): 77 Pulse (bpm): 108 Weight (lbs): 280 Respiratory Rate (breaths/min): 18 Body Mass Index (BMI): 33.2 Blood Pressure (mmHg): 124/84 Reference Range: 80 - 120 mg / dl Electronic Signature(s) Signed: 11/12/2019 10:17:44 AM By: Karl Ito Entered By: Karl Ito on 11/11/2019 11:00:00

## 2019-11-14 ENCOUNTER — Encounter (HOSPITAL_BASED_OUTPATIENT_CLINIC_OR_DEPARTMENT_OTHER): Payer: BC Managed Care – PPO | Admitting: Internal Medicine

## 2019-11-18 ENCOUNTER — Other Ambulatory Visit: Payer: Self-pay

## 2019-11-18 ENCOUNTER — Encounter (HOSPITAL_BASED_OUTPATIENT_CLINIC_OR_DEPARTMENT_OTHER): Payer: BC Managed Care – PPO | Admitting: Internal Medicine

## 2019-11-18 DIAGNOSIS — E11621 Type 2 diabetes mellitus with foot ulcer: Secondary | ICD-10-CM | POA: Diagnosis not present

## 2019-11-18 NOTE — Progress Notes (Addendum)
[place], [name] ([id number]) Visit Report for 11/18/2019 Arrival Information Details Patient Name: Date of Service: [name] 11/18/2019 3:15 PM Medical Record Number: [id number] Patient Account Number: [id number] Date of Birth/Sex: Treating RN: Feb 17, 1986 (34 y.o. [name] Primary Care Taym Twist: Katy Apo Other Clinician: Referring Mack Thurmon: Treating Faizon Capozzi/Extender: Demetria Pore in Treatment: 3 Visit Information History Since Last Visit Added or deleted any medications: No Patient Arrived: Ambulatory Any new allergies or adverse reactions: No Arrival Time: 15:34 Had a fall or experienced change in No Accompanied By: self activities of daily living that may affect Transfer Assistance: None risk of falls: Patient Identification Verified: Yes Signs or symptoms of abuse/neglect since No Secondary Verification Process Completed: Yes last visito Patient Requires Transmission-Based Precautions: No Hospitalized since last visit: No Patient Has Alerts: No Implantable device outside of the clinic No excluding cellular tissue based products placed in the center since last visit: Has Dressing in Place as Prescribed: Yes Has Footwear/Offloading in Place as Yes Prescribed: Left: Surgical Shoe with Pressure Relief Insole Right: Surgical Shoe with Pressure Relief Insole Pain Present Now: No Electronic Signature(s) Signed: 11/18/2019 5:24:06 PM By: Cherylin Mylar Entered By: Cherylin Mylar on 11/18/2019 15:35:11 -------------------------------------------------------------------------------- Encounter Discharge Information Details Patient Name: Date of Service: [name], [name] 11/18/2019 3:15 PM Medical Record Number: [id number] Patient Account Number: [id number] Date of Birth/Sex: Treating RN: [birth year] (34 y.o. [name] Primary Care Kristell Wooding: Katy Apo Other Clinician: Referring Steadman Prosperi: Treating  Bernell Sigal/Extender: Demetria Pore in Treatment: 3 Encounter Discharge Information Items Post Procedure Vitals Discharge Condition: Stable Temperature (F): 98.4 Ambulatory Status: Ambulatory Pulse (bpm): 111 Discharge Destination: Home Respiratory Rate (breaths/min): 17 Transportation: Private Auto Blood Pressure (mmHg): 138/83 Accompanied By: self Schedule Follow-up Appointment: Yes Clinical Summary of Care: Patient Declined Electronic Signature(s) Signed: 11/18/2019 5:24:06 PM By: Cherylin Mylar Entered By: Cherylin Mylar on 11/18/2019 17:17:37 -------------------------------------------------------------------------------- Lower Extremity Assessment Details Patient Name: Date of Service: [name] 11/18/2019 3:15 PM Medical Record Number: [id number] Patient Account Number: [id number] Date of Birth/Sex: Treating RN: 11-Jun-1985 (34 y.o. [name] Primary Care Charles Andringa: Katy Apo Other Clinician: Referring Ricahrd Schwager: Treating Bradan Congrove/Extender: Myna Bright Weeks in Treatment: 3 Edema Assessment Assessed: [Left: No] [Right: No] Edema: [Left: No] [Right: No] Calf Left: Right: Point of Measurement: 48 cm From Medial Instep 45 cm 44 cm Ankle Left: Right: Point of Measurement: 10 cm From Medial Instep 28 cm 32 cm Vascular Assessment Pulses: Dorsalis Pedis Palpable: [Left:Yes] [Right:Yes] Electronic Signature(s) Signed: 11/18/2019 5:24:06 PM By: Cherylin Mylar Entered By: Cherylin Mylar on 11/18/2019 15:39:22 -------------------------------------------------------------------------------- Multi Wound Chart Details Patient Name: Date of Service: [name], [name] 11/18/2019 3:15 PM Medical Record Number: [id number] Patient Account Number: [id number] Date of Birth/Sex: Treating RN: 1985/12/23 (34 y.o. [name] Primary Care Laurena Valko: Katy Apo Other Clinician: Referring  Makari Sanko: Treating Natoria Archibald/Extender: Demetria Pore in Treatment: 3 Vital Signs Height(in): 77 Pulse(bpm): 111 Weight(lbs): 280 Blood Pressure(mmHg): 138/83 Body Mass Index(BMI): 33 Temperature(F): 98.4 Respiratory Rate(breaths/min): 17 Photos: [2:No Photos Right, Lateral Foot] [3:No Photos Left, Lateral Foot] [N/A:N/A N/A] Wound Location: [2:Gradually Appeared] [3:Trauma] [N/A:N/A] Wounding Event: [2:Diabetic Wound/Ulcer of the Lower] [3:Diabetic Wound/Ulcer of the Lower] [N/A:N/A] Primary Etiology: [2:Extremity Type II Diabetes] [3:Extremity Type II Diabetes] [N/A:N/A] Comorbid History: [2:09/03/2019] [3:10/02/2019] [N/A:N/A] Date Acquired: [2:3] [3:3] [N/A:N/A] Weeks of Treatment: [2:Open] [3:Open] [N/A:N/A] Wound Status: [2:1.9x1.5x0.2] [3:0.8x0.7x0.2] [N/A:N/A] Measurements  L x W x [name] [2:2.238] [3:0.44] [N/A:N/A] A (cm) : rea [2:0.448] [3:0.088] [N/A:N/A] Volume (cm) : [2:59.40%] [3:73.30%] [N/A:N/A] % Reduction in A rea: [2:18.70%] [3:46.70%] [N/A:N/A] % Reduction in Volume: [2:Grade 2] [3:Grade 2] [N/A:N/A] Classification: [2:Medium] [3:Medium] [N/A:N/A] Exudate A mount: [2:Serosanguineous] [3:Serosanguineous] [N/A:N/A] Exudate Type: [2:red, brown] [3:red, brown] [N/A:N/A] Exudate Color: [2:Flat and Intact] [3:Flat and Intact] [N/A:N/A] Wound Margin: [2:Large (67-100%)] [3:Large (67-100%)] [N/A:N/A] Granulation A mount: [2:Red] [3:Red] [N/A:N/A] Granulation Quality: [2:Small (1-33%)] [3:None Present (0%)] [N/A:N/A] Necrotic A mount: [2:Fat Layer (Subcutaneous Tissue)] [3:Fat Layer (Subcutaneous Tissue)] [N/A:N/A] Exposed Structures: [2:Exposed: Yes Fascia: No Tendon: No Muscle: No Joint: No Bone: No Small (1-33%)] [3:Exposed: Yes Fascia: No Tendon: No Muscle: No Joint: No Bone: No Small (1-33%)] [N/A:N/A] Epithelialization: [2:Debridement - Selective/Open Wound] [3:Debridement - Selective/Open Wound] [N/A:N/A] Debridement: Pre-procedure  Verification/Time Out 16:30 [3:16:30] [N/A:N/A] Taken: [2:Callus] [3:Callus] [N/A:N/A] Tissue Debrided: [2:Skin/Epidermis] [3:Skin/Epidermis] [N/A:N/A] Level: [2:2.85] [3:0.56] [N/A:N/A] Debridement A (sq cm): [2:rea Curette] [3:Curette] [N/A:N/A] Instrument: [2:Minimum] [3:Minimum] [N/A:N/A] Bleeding: [2:Pressure] [3:Pressure] [N/A:N/A] Hemostasis A chieved: [2:0] [3:0] [N/A:N/A] Procedural Pain: [2:0] [3:0] [N/A:N/A] Post Procedural Pain: [2:Procedure was tolerated well] [3:Procedure was tolerated well] [N/A:N/A] Debridement Treatment Response: [2:1.9x1.5x0.2] [3:0.8x0.7x0.2] [N/A:N/A] Post Debridement Measurements L x W x [name] [2:0.448] [3:0.088] [N/A:N/A] Post Debridement Volume: (cm) [2:Debridement] [3:Debridement] [N/A:N/A] Treatment Notes Wound #2 (Right, Lateral Foot) 1. Cleanse With Wound Cleanser 2. Periwound Care Skin Prep 3. Primary Dressing Applied Hydrofera Blue 4. Secondary Dressing Dry Gauze Roll Gauze Foam 5. Secured With Tape Notes foam donut Wound #3 (Left, Lateral Foot) 1. Cleanse With Wound Cleanser 2. Periwound Care Skin Prep 3. Primary Dressing Applied Hydrofera Blue 4. Secondary Dressing Dry Gauze Roll Gauze Foam 5. Secured With Tape Notes foam donut Electronic Signature(s) Signed: 11/18/2019 5:53:04 PM By: Baltazar Najjarobson, Michael MD Signed: 11/21/2019 5:36:57 PM By: Zandra AbtsLynch, Shatara RN, BSN Entered By: Baltazar Najjarobson, Michael on 11/18/2019 17:25:25 -------------------------------------------------------------------------------- Multi-Disciplinary Care Plan Details Patient Name: Date of Service: [name], [name] 11/18/2019 3:15 PM Medical Record Number: [id number] Patient Account Number: [id number] Date of Birth/Sex: Treating RN: 13-Jun-1985 (34 y.o. Elizebeth KollerM) Lynch, Shatara Primary Care Emberlie Gotcher: Katy ApoPolite, Ronald [name] Other Clinician: Referring Deonte Otting: Treating Albaro Deviney/Extender: Demetria Poreobson, Michael [name], Ronald [name] Weeks in Treatment: 3 Active  Inactive Nutrition Nursing Diagnoses: Imbalanced nutrition Potential for alteratiion in Nutrition/Potential for imbalanced nutrition Goals: Patient/caregiver agrees to and verbalizes understanding of need to use nutritional supplements and/or vitamins as prescribed Date Initiated: 10/24/2019 Target Resolution Date: 11/29/2019 Goal Status: Active Patient/caregiver will maintain therapeutic glucose control Date Initiated: 10/24/2019 Target Resolution Date: 11/29/2019 Goal Status: Active Interventions: Assess HgA1c results as ordered upon admission and as needed Assess patient nutrition upon admission and as needed per policy Provide education on elevated blood sugars and impact on wound healing Provide education on nutrition Treatment Activities: Education provided on Nutrition : 10/24/2019 Notes: Wound/Skin Impairment Nursing Diagnoses: Impaired tissue integrity Knowledge deficit related to ulceration/compromised skin integrity Goals: Patient/caregiver will verbalize understanding of skin care regimen Date Initiated: 10/24/2019 Target Resolution Date: 11/29/2019 Goal Status: Active Ulcer/skin breakdown will have a volume reduction of 30% by week 4 Date Initiated: 10/24/2019 Target Resolution Date: 11/29/2019 Goal Status: Active Interventions: Assess patient/caregiver ability to obtain necessary supplies Assess patient/caregiver ability to perform ulcer/skin care regimen upon admission and as needed Assess ulceration(s) every visit Provide education on ulcer and skin care Notes: Electronic Signature(s) Signed: 11/18/2019 5:14:32 PM By: Zandra AbtsLynch, Shatara RN, BSN Entered By: Zandra AbtsLynch, Shatara on 11/18/2019 17:09:36 -------------------------------------------------------------------------------- Pain Assessment Details Patient Name: Date of  Service: [name] 11/18/2019 3:15 PM Medical Record Number: [id number] Patient Account Number: [id number] Date of Birth/Sex: Treating  RN: 1985-07-20 (34 y.o. [name] Primary Care Avril Busser: Katy Apo Other Clinician: Referring Kashira Behunin: Treating Brandt Chaney/Extender: Demetria Pore in Treatment: 3 Active Problems Location of Pain Severity and Description of Pain Patient Has Paino No Site Locations Pain Management and Medication Current Pain Management: Electronic Signature(s) Signed: 11/18/2019 5:24:06 PM By: Cherylin Mylar Entered By: Cherylin Mylar on 11/18/2019 15:39:14 -------------------------------------------------------------------------------- Patient/Caregiver Education Details Patient Name: Date of Service: [name] 8/16/2021andnbsp3:15 PM Medical Record Number: [id number] Patient Account Number: [id number] Date of Birth/Gender: Treating RN: 04/30/1985 (34 y.o. [name] Primary Care Physician: Katy Apo Other Clinician: Referring Physician: Treating Physician/Extender: Demetria Pore in Treatment: 3 Education Assessment Education Provided To: Patient Education Topics Provided Wound/Skin Impairment: Methods: Explain/Verbal Responses: State content correctly Electronic Signature(s) Signed: 11/18/2019 5:14:32 PM By: Zandra Abts RN, BSN Entered By: Zandra Abts on 11/18/2019 17:09:47 -------------------------------------------------------------------------------- Wound Assessment Details Patient Name: Date of Service: [name], [name] 11/18/2019 3:15 PM Medical Record Number: [id number] Patient Account Number: [id number] Date of Birth/Sex: Treating RN: February 02, 1986 (34 y.o. [name] Primary Care Lindzy Rupert: Katy Apo Other Clinician: Referring Solash Tullo: Treating Aasha Dina/Extender: Demetria Pore in Treatment: 3 Wound Status Wound Number: 2 Primary Etiology: Diabetic Wound/Ulcer of the Lower Extremity Wound Location: Right, Lateral Foot Wound Status:  Open Wounding Event: Gradually Appeared Comorbid History: Type II Diabetes Date Acquired: 09/03/2019 Weeks Of Treatment: 3 Clustered Wound: No Photos Photo Uploaded By: [name] on 11/21/2019 15:58:33 Wound Measurements Length: (cm) 1.9 Width: (cm) 1.5 Depth: (cm) 0.2 Area: (cm) 2.238 Volume: (cm) 0.448 % Reduction in Area: 59.4% % Reduction in Volume: 18.7% Epithelialization: Small (1-33%) Tunneling: No Undermining: No Wound Description Classification: Grade 2 Wound Margin: Flat and Intact Exudate Amount: Medium Exudate Type: Serosanguineous Exudate Color: red, brown Foul Odor After Cleansing: No Slough/Fibrino Yes Wound Bed Granulation Amount: Large (67-100%) Exposed Structure Granulation Quality: Red Fascia Exposed: No Necrotic Amount: Small (1-33%) Fat Layer (Subcutaneous Tissue) Exposed: Yes Necrotic Quality: Adherent Slough Tendon Exposed: No Muscle Exposed: No Joint Exposed: No Bone Exposed: No Treatment Notes Wound #2 (Right, Lateral Foot) 1. Cleanse With Wound Cleanser 2. Periwound Care Skin Prep 3. Primary Dressing Applied Hydrofera Blue 4. Secondary Dressing Dry Gauze Roll Gauze Foam 5. Secured With Tape Notes foam donut Electronic Signature(s) Signed: 11/18/2019 5:24:06 PM By: Cherylin Mylar Entered By: Cherylin Mylar on 11/18/2019 15:44:48 -------------------------------------------------------------------------------- Wound Assessment Details Patient Name: Date of Service: [name] 11/18/2019 3:15 PM Medical Record Number: [id number] Patient Account Number: [id number] Date of Birth/Sex: Treating RN: [birth year] (34 y.o. [name] Primary Care Brayen Bunn: Katy Apo Other Clinician: Referring Eliya Geiman: Treating Laquasha Groome/Extender: Demetria Pore in Treatment: 3 Wound Status Wound Number: 3 Primary Etiology: Diabetic Wound/Ulcer of the Lower Extremity Wound Location: Left,  Lateral Foot Wound Status: Open Wounding Event: Trauma Comorbid History: Type II Diabetes Date Acquired: 10/02/2019 Weeks Of Treatment: 3 Clustered Wound: No Photos Photo Uploaded By: [name] on 11/21/2019 15:58:34 Wound Measurements Length: (cm) 0.8 Width: (cm) 0.7 Depth: (cm) 0.2 Area: (cm) 0.44 Volume: (cm) 0.088 Wound Description Classification: Grade 2 Wound Margin: Flat and Intact Exudate Amount: Medium Exudate Type: Serosanguineous Exudate Color: red, brown Foul Odor After Cleansing: Slough/Fibrino % Reduction in Area: 73.3% % Reduction in Volume: 46.7%  Epithelialization: Small (1-33%) Tunneling: No Undermining: No No Yes Wound Bed Granulation Amount: Large (67-100%) Exposed Structure Granulation Quality: Red Fascia Exposed: No Necrotic Amount: None Present (0%) Fat Layer (Subcutaneous Tissue) Exposed: Yes Tendon Exposed: No Muscle Exposed: No Joint Exposed: No Bone Exposed: No Treatment Notes Wound #3 (Left, Lateral Foot) 1. Cleanse With Wound Cleanser 2. Periwound Care Skin Prep 3. Primary Dressing Applied Hydrofera Blue 4. Secondary Dressing Dry Gauze Roll Gauze Foam 5. Secured With Tape Notes foam donut Electronic Signature(s) Signed: 11/18/2019 5:24:06 PM By: Cherylin Mylar Entered By: Cherylin Mylar on 11/18/2019 15:45:28 -------------------------------------------------------------------------------- Vitals Details Patient Name: Date of Service: [name], [name] 11/18/2019 3:15 PM Medical Record Number: [id number] Patient Account Number: [id number] Date of Birth/Sex: Treating RN: [birth year] (34 y.o. [name] Primary Care Mardy Lucier: Katy Apo Other Clinician: Referring Abriella Filkins: Treating Rama Mcclintock/Extender: Demetria Pore in Treatment: 3 Vital Signs Time Taken: 15:35 Temperature (F): 98.4 Height (in): 77 Pulse (bpm): 111 Weight (lbs): 280 Respiratory Rate (breaths/min):  17 Body Mass Index (BMI): 33.2 Blood Pressure (mmHg): 138/83 Reference Range: 80 - 120 mg / dl Electronic Signature(s) Signed: 11/18/2019 5:24:06 PM By: Cherylin Mylar Entered By: Cherylin Mylar on 11/18/2019 15:39:07

## 2019-11-19 ENCOUNTER — Encounter: Payer: Self-pay | Admitting: Internal Medicine

## 2019-11-19 ENCOUNTER — Encounter (INDEPENDENT_AMBULATORY_CARE_PROVIDER_SITE_OTHER): Payer: BC Managed Care – PPO | Admitting: Ophthalmology

## 2019-11-19 ENCOUNTER — Ambulatory Visit (INDEPENDENT_AMBULATORY_CARE_PROVIDER_SITE_OTHER): Payer: BC Managed Care – PPO | Admitting: Ophthalmology

## 2019-11-19 ENCOUNTER — Encounter (INDEPENDENT_AMBULATORY_CARE_PROVIDER_SITE_OTHER): Payer: Self-pay | Admitting: Ophthalmology

## 2019-11-19 ENCOUNTER — Ambulatory Visit (INDEPENDENT_AMBULATORY_CARE_PROVIDER_SITE_OTHER): Payer: BC Managed Care – PPO | Admitting: Internal Medicine

## 2019-11-19 ENCOUNTER — Other Ambulatory Visit: Payer: Self-pay

## 2019-11-19 DIAGNOSIS — E103411 Type 1 diabetes mellitus with severe nonproliferative diabetic retinopathy with macular edema, right eye: Secondary | ICD-10-CM | POA: Diagnosis not present

## 2019-11-19 DIAGNOSIS — E103412 Type 1 diabetes mellitus with severe nonproliferative diabetic retinopathy with macular edema, left eye: Secondary | ICD-10-CM

## 2019-11-19 DIAGNOSIS — E11628 Type 2 diabetes mellitus with other skin complications: Secondary | ICD-10-CM | POA: Diagnosis not present

## 2019-11-19 DIAGNOSIS — L089 Local infection of the skin and subcutaneous tissue, unspecified: Secondary | ICD-10-CM | POA: Diagnosis not present

## 2019-11-19 NOTE — Assessment & Plan Note (Signed)
Small region of new thickening temporal to fovea in the right eye, yet not center involved with good acuity and the option is to watch and monitor this.

## 2019-11-19 NOTE — Assessment & Plan Note (Signed)
I am hopeful that his diabetic foot infections have been cured.  His inflammatory markers have normalized as of 1 month ago.  He will stop doxycycline now.  We will schedule follow-up in 4 to 6 weeks.

## 2019-11-19 NOTE — Assessment & Plan Note (Signed)
CSME has improved OS status post intravitreal Eylea on 2 occasions, last exam and injection was September 24, 2019.

## 2019-11-19 NOTE — Progress Notes (Signed)
[facility]  Patient Active Problem List   Diagnosis Date Noted  . Diabetic foot infection (HCC) 09/28/2019    Priority: High  . Diabetic foot ulcer associated with type 1 diabetes mellitus (HCC)   . Leg swelling   . Cellulitis of right lower extremity   . Normocytic anemia 09/28/2019  . 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 Severe nonproliferative diabetic retinopathy of left eye, with macular edema, associated with type 1 diabetes mellitus (HCC) 07/18/2019  . Diabetic cataract (HCC) 07/18/2019  . Osteomyelitis of ankle or foot, acute, left (HCC) 03/11/2019  . Pneumonia due to severe acute respiratory syndrome coronavirus 2 (SARS-CoV-2) 02/12/2019  . Pneumonia due to COVID-19 virus 02/07/2019  . Acute respiratory failure with hypoxia (HCC) 02/07/2019  . Uncontrolled type 1 diabetes mellitus with hyperglycemia (HCC) 02/07/2019  . COVID-19 virus infection 02/04/2019  . Sepsis (HCC) 02/03/2019  . 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 AKI (acute kidney injury) (HCC) 02/03/2019  . CAP (community acquired pneumonia) 06/28/2017  . 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 Hyperglycemia without ketosis 01/19/2013    Patient's Medications  New Prescriptions   No medications on file  Previous Medications   CONTINUOUS BLOOD GLUC SENSOR (FREESTYLE LIBRE 14 DAY SENSOR) MISC    Inject 1 patch into the skin every 14 (fourteen) days.   CONTINUOUS BLOOD GLUC SENSOR MISC    1 each by Does not apply route as directed. Use as directed every 14 days. May dispense FreeStyle [employer] or similar.   INSULIN ASPART (NOVOLOG) 100 UNIT/ML INJECTION    Inject 0-15 Units into the skin 3 (three) times daily with meals.   INSULIN GLARGINE (LANTUS SOLOSTAR) 100 UNIT/ML SOLOSTAR PEN    Inject 40 Units into  the skin daily before breakfast.   SILVER SULFADIAZINE (SILVADENE) 1 % CREAM    Apply pea-sized amount to wound daily.  Modified Medications   No medications on file  Discontinued Medications   DOXYCYCLINE (VIBRA-TABS) 100 MG TABLET    Take 1 tablet (100 mg total) by mouth 2 (two) times daily.    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  He had a fracture of his left foot last summer and eventually developed a plantar ulcer on the lateral aspect of his left foot.  He started going to the wound center last fall after his foot became infected.  Wound drainage cultures in November, December and January grew Serratia staph lugdunensis, and Enterococcus.  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  He seemed to be healing well.  About 3 weeks ago he began to have swelling and redness of his right foot.  Recently the wound started to open and drain.  He was seen in urgent care on 09/26/2019 and started on doxycycline and referred to Dr. Ardelle Anton who saw him in the office 2 days later.  He drained an abscess in his left foot and directed him for admission here.  The abscess culture is grew MRSA  He has had MRIs of both feet showing:  Left foot IMPRESSION: 1. Apparent skin ulceration inferior and lateral to the 5th metatarsal base with underlying heterogeneous T2 signal and enhancement in the subcutaneous fat. Small peripherally enhancing fluid collections  along the plantar and lateral aspects of the 5th metatarsal base suspicious for abscesses. 2. Interval amputation through the mid 5th metatarsal with nonspecific low-level marrow edema and enhancement. Given the proximity to the adjacent soft tissue inflammatory changes, osteomyelitis cannot be excluded. 3. The additional bones appear unremarkable.  Right foot IMPRESSION: 1. Soft tissue ulceration lateral to the 5th MTP joint. There  is low-level T2 hyperintensity within the 4th and 5th metatarsal heads and adjacent proximal phalanges without abnormal T1 signal or cortical destruction. These findings are nonspecific and could be seen with early marrow edema, hyperemia or early osteomyelitis. No evidence of septic joint. 2. Mild tenosynovitis and synovial enhancement associated with the extensor digitorum tendons at the level of the midfoot. 3. Diffuse low-level muscular T2 hyperintensity and enhancement, most consistent with diabetic myopathy.  Bone biopsies of both feet grew MRSA and methicillin susceptible staph lugdunensis.  He did not want to have a PICC placed for IV antibiotics.  He was discharged on oral linezolid, levofloxacin and metronidazole.  When I saw him on 10/23/2019 he was doing much better.  His ulcers were healing.  Change linezolid to doxycycline along with continued levofloxacin and metronidazole.  He completed levofloxacin and metronidazole last week and is still taking doxycycline.  He was seen at the wound center on 11/01/2019 and yesterday.  Both times he was told that there was no evidence of any active infection.  Review of Systems: Review of Systems  Constitutional: Negative for chills, diaphoresis and fever.  Gastrointestinal: Negative for abdominal pain, diarrhea, nausea and vomiting.  Musculoskeletal: Negative for joint pain.    Past Medical History:  Diagnosis Date  . Diabetes mellitus     Social History   Tobacco Use  . Smoking status: Never Smoker  . Smokeless tobacco: Never Used  Vaping Use  . Vaping Use: Never used  Substance Use Topics  . Alcohol use: No  . Drug use: No    Family History  Problem Relation Age of Onset  . Hypertension Other   . Diabetes Other   . Healthy Mother   . Colon cancer Neg Hx   . Stomach cancer Neg Hx   . Pancreatic cancer Neg Hx   . Esophageal cancer Neg Hx     Allergies  Allergen Reactions  . Basaglar Stephanie Coup [Insulin Glargine]  Diarrhea  . Metformin And Related Diarrhea and Nausea And Vomiting  . Trulicity [Dulaglutide] Diarrhea    Objective: Vitals:   11/19/19 1549  BP: 128/80  Pulse: 97  Temp: 98.8 F (37.1 C)  TempSrc: Oral  Weight: 297 lb (134.7 kg)   Body mass index is 35.22 kg/m.  Physical Exam Constitutional:      Comments: He is pleasant and in no distress.  Musculoskeletal:     Comments: I did not remove and replace the dressings that were put on at the wound center yesterday.         Lab Results Sed Rate  Date Value  10/23/2019 14 mm/h  10/14/2019 33 mm/h (H)  09/28/2019 129 mm/hr (H)   CRP  Date Value  10/23/2019 0.6 mg/L  10/14/2019 1.3 mg/L  09/28/2019 5.0 mg/dL (H)     Problem List Items Addressed This Visit      High   Diabetic foot infection (HCC)    I am hopeful that his diabetic foot infections have been cured.  His inflammatory markers have normalized as of 1 month ago.  He will stop doxycycline now.  We will schedule  follow-up in 4 to 6 weeks.          Cliffton Asters, MD [facility] [phone] pager   [phone] cell 11/19/2019, 4:05 PM

## 2019-11-19 NOTE — Progress Notes (Signed)
11/19/2019     CHIEF COMPLAINT Patient presents for Retina Follow Up   HISTORY OF PRESENT ILLNESS: [name] is a 34 y.o. male who presents to the clinic today for:   HPI    Retina Follow Up    Patient presents with  Diabetic Retinopathy.  In left eye.  This started 8 weeks ago.  Severity is mild.  Duration of 8 weeks.  Since onset it is stable.          Comments    8 Week Diabetic F/U OU, poss Eylea OS  Pt sts [facility] OU is improving gradually. Pt denies ocular pain, flashes of light, or floaters OU.   LBS: 160 yesterday        Last edited by Ileana Roup, COA on 11/19/2019 10:52 AM. (History)      Referring physician: [name], MD [address] [place],  [place] [address]  HISTORICAL INFORMATION:   Selected notes from the MEDICAL RECORD NUMBER    Lab Results  Component Value Date   HGBA1C 11.7 (H) 09/28/2019     CURRENT MEDICATIONS: No current outpatient medications on file. (Ophthalmic Drugs)   No current facility-administered medications for this visit. (Ophthalmic Drugs)   Current Outpatient Medications (Other)  Medication Sig  . Continuous Blood Gluc Sensor (FREESTYLE LIBRE 14 DAY SENSOR) MISC Inject 1 patch into the skin every 14 (fourteen) days.  . Continuous Blood Gluc Sensor MISC 1 each by Does not apply route as directed. Use as directed every 14 days. May dispense FreeStyle [employer] or similar.  . doxycycline (VIBRA-TABS) 100 MG tablet Take 1 tablet (100 mg total) by mouth 2 (two) times daily.  . insulin aspart (NOVOLOG) 100 UNIT/ML injection Inject 0-15 Units into the skin 3 (three) times daily with meals. (Patient taking differently: Inject 7-12 Units into the skin 3 (three) times daily as needed (before meals, if BGL is 250 or greater). )  . insulin glargine (LANTUS SOLOSTAR) 100 UNIT/ML Solostar Pen Inject 40 Units into the skin daily before breakfast.  . silver sulfADIAZINE (SILVADENE) 1 % cream Apply pea-sized  amount to wound daily.   No current facility-administered medications for this visit. (Other)      REVIEW OF SYSTEMS:    ALLERGIES Allergies  Allergen Reactions  . Basaglar Stephanie Coup [Insulin Glargine] Diarrhea  . Metformin And Related Diarrhea and Nausea And Vomiting  . Trulicity [Dulaglutide] Diarrhea    PAST MEDICAL HISTORY Past Medical History:  Diagnosis Date  . Diabetes mellitus    Past Surgical History:  Procedure Laterality Date  . 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 NO PAST SURGERIES    . WOUND DEBRIDEMENT Bilateral 10/02/2019   Procedure: DEBRIDEMENT WOUNDS BOTH LOWER EXTREMITIES WITH BONE BIOPSIES;  Surgeon: Vivi Barrack, DPM;  Location: [facility] OR;  Service: Podiatry;  Laterality: Bilateral;    FAMILY HISTORY Family History  Problem Relation Age of Onset  . Hypertension Other   . Diabetes Other   . Healthy Mother   . Colon cancer Neg Hx   . Stomach cancer Neg Hx   . Pancreatic cancer Neg Hx   . Esophageal cancer Neg Hx     SOCIAL HISTORY Social History   Tobacco Use  . Smoking status: Never Smoker  . Smokeless tobacco: Never Used  Vaping Use  . Vaping Use: Never  used  Substance Use Topics  . Alcohol use: No  . Drug use: No         OPHTHALMIC EXAM:  Base Eye Exam    Visual Acuity (ETDRS)      Right Left   Dist [place] 20/40 20/50 +2   Dist ph [place] 20/20 -2 20/40 +2       Tonometry (Tonopen, 10:55 AM)      Right Left   Pressure 14 12       Pupils      Pupils Dark Light Shape React APD   Right PERRL 4 3 Round Brisk None   Left PERRL 4 3 Round Brisk None       Visual Fields (Counting fingers)      Left Right    Full Full       Extraocular Movement      Right Left    Full Full       Neuro/Psych    Oriented x3: Yes   Mood/Affect: Normal       Dilation    Both eyes: 1.0% Mydriacyl, 2.5%  Phenylephrine @ 10:57 AM        Slit Lamp and Fundus Exam    External Exam      Right Left   External Normal Normal       Slit Lamp Exam      Right Left   Lids/Lashes Normal Normal   Conjunctiva/Sclera White and quiet White and quiet   Cornea Clear Clear   Anterior Chamber Deep and quiet Deep and quiet   Iris Round and reactive Round and reactive   Lens 1+ Nuclear sclerosis 1+ Nuclear sclerosis, sub anterior capsule opacity   Anterior Vitreous Normal Normal       Fundus Exam      Right Left   Posterior Vitreous Normal Normal   Disc Normal Normal   C/D Ratio 0.5 0.5   Macula no macular thickening, Microaneurysms Exudates, Microaneurysms, Macular thickening minor   Vessels NPDR-Severe Severe nonproliferative diabetic retinopathy   Periphery Normal Normal          IMAGING AND PROCEDURES  Imaging and Procedures for 11/19/19  OCT, Retina - OU - Both Eyes       Right Eye Quality was good. Scan locations included subfoveal. Central Foveal Thickness: 258. Progression has worsened.   Left Eye Quality was good. Scan locations included subfoveal. Central Foveal Thickness: 242. Progression has improved.   Notes No active CSME OS, improved post injection intravitreal Eylea we will continue to monitor closely  OD with very small focal changes temporal to the fovea with good visual acuity can observe.                ASSESSMENT/PLAN:  Severe nonproliferative diabetic retinopathy of left eye, with macular edema, associated with type 1 diabetes mellitus (HCC) CSME has improved OS status post intravitreal Eylea on 2 occasions, last exam and injection was September 24, 2019.  Severe nonproliferative diabetic retinopathy of right eye, with macular edema, associated with type 1 diabetes mellitus (HCC) Small region of new thickening temporal to fovea in the right eye, yet not center involved with good acuity and the option is to watch and monitor this.      ICD-10-CM   1.  Severe nonproliferative diabetic retinopathy of left eye, with macular edema, associated with type 1 diabetes mellitus (HCC)  E10.3412 OCT, Retina - OU - Both Eyes  2. Severe nonproliferative diabetic retinopathy of right eye, with  macular edema, associated with type 1 diabetes mellitus (HCC)  E10.3411 OCT, Retina - OU - Both Eyes    1.  OS has improved status post injection for CSME with Eylea.  Hereafter observe for the next 3 months.  2.  Patient may proceed to with [facility] refraction and general eye exam 3.  I did asked the patient to be aware of sleep apnea and its effects including nocturia, snoring, and its hypoxic damage to retinal vasculature particularly in the setting of advanced diabetes  4.  We will monitor closely and asked the patient return here in 4 months and monitor with OCT OU  Ophthalmic Meds Ordered this visit:  No orders of the defined types were placed in this encounter.      Return in about 3 months (around 02/19/2020) for DILATE OU, OCT.  There are no Patient Instructions on file for this visit.   Explained the diagnoses, plan, and follow up with the patient and they expressed understanding.  Patient expressed understanding of the importance of proper follow up care.   Alford Highland Keora Eccleston M.D. Diseases & Surgery of the Retina and Vitreous [facility] 11/19/19     Abbreviations: M myopia (nearsighted); A astigmatism; H hyperopia (farsighted); P presbyopia; Mrx spectacle prescription;  CTL contact lenses; OD right eye; OS left eye; OU both eyes  XT exotropia; ET esotropia; PEK punctate epithelial keratitis; PEE punctate epithelial erosions; DES dry eye syndrome; MGD meibomian gland dysfunction; ATs artificial tears; PFAT's preservative free artificial tears; NSC nuclear sclerotic cataract; PSC posterior subcapsular cataract; ERM epi-retinal membrane; PVD posterior vitreous detachment; RD retinal detachment; DM diabetes mellitus; DR  diabetic retinopathy; NPDR non-proliferative diabetic retinopathy; PDR proliferative diabetic retinopathy; CSME clinically significant macular edema; DME diabetic macular edema; dbh dot blot hemorrhages; CWS cotton wool spot; POAG primary open angle glaucoma; C/D cup-to-disc ratio; HVF humphrey visual field; GVF goldmann visual field; OCT optical coherence tomography; IOP intraocular pressure; BRVO Branch retinal vein occlusion; CRVO central retinal vein occlusion; CRAO central retinal artery occlusion; BRAO branch retinal artery occlusion; RT retinal tear; SB scleral buckle; PPV pars plana vitrectomy; VH Vitreous hemorrhage; PRP panretinal laser photocoagulation; IVK intravitreal kenalog; VMT vitreomacular traction; MH Macular hole;  NVD neovascularization of the disc; NVE neovascularization elsewhere; AREDS age related eye disease study; ARMD age related macular degeneration; POAG primary open angle glaucoma; EBMD epithelial/anterior basement membrane dystrophy; ACIOL anterior chamber intraocular lens; IOL intraocular lens; PCIOL posterior chamber intraocular lens; Phaco/IOL phacoemulsification with intraocular lens placement; PRK photorefractive keratectomy; LASIK laser assisted in situ keratomileusis; HTN hypertension; DM diabetes mellitus; COPD chronic obstructive pulmonary disease

## 2019-11-21 NOTE — Progress Notes (Signed)
[name] ([id number]) Visit Report for 11/18/2019 Debridement Details Patient Name: Date of Service: [name] D 11/18/2019 3:15 PM Medical Record Number: [id number] Patient Account Number: [id number] Date of Birth/Sex: Treating RN: 14-Apr-1985 (34 y.o. [name] Primary Care Provider: [name] Other Clinician: Referring Provider: Treating Provider/Extender: Delton See in Treatment: 3 Debridement Performed for Assessment: Wound #2 Right,Lateral Foot Performed By: Physician [name], MD Debridement Type: Debridement Severity of Tissue Pre Debridement: Fat layer exposed Level of Consciousness (Pre-procedure): Awake and Alert Pre-procedure Verification/Time Out Yes - 16:30 Taken: Start Time: 16:30 T Area Debrided (L x W): otal 1.9 (cm) x 1.5 (cm) = 2.85 (cm) Tissue and other material debrided: Non-Viable, Callus, Skin: Epidermis Level: Skin/Epidermis Debridement Description: Selective/Open Wound Instrument: Curette Bleeding: Minimum Hemostasis Achieved: Pressure End Time: 16:32 Procedural Pain: 0 Post Procedural Pain: 0 Response to Treatment: Procedure was tolerated well Level of Consciousness (Post- Awake and Alert procedure): Post Debridement Measurements of Total Wound Length: (cm) 1.9 Width: (cm) 1.5 Depth: (cm) 0.2 Volume: (cm) 0.448 Character of Wound/Ulcer Post Debridement: Improved Severity of Tissue Post Debridement: Fat layer exposed Post Procedure Diagnosis Same as Pre-procedure Electronic Signature(s) Signed: 11/18/2019 5:53:04 PM By: [name] MD Signed: 11/21/2019 5:36:57 PM By: [name] RN, BSN Previous Signature: 11/18/2019 5:14:32 PM Version By: [name] RN, BSN Entered By: [name] on 11/18/2019 17:25:35 -------------------------------------------------------------------------------- Debridement Details Patient Name: Date of Service: [name], [name] 11/18/2019  3:15 PM Medical Record Number: [id number] Patient Account Number: [id number] Date of Birth/Sex: Treating RN: 1986-02-13 (34 y.o. [name] Primary Care Provider: [name] Other Clinician: Referring Provider: Treating Provider/Extender: Delton See in Treatment: 3 Debridement Performed for Assessment: Wound #3 Left,Lateral Foot Performed By: Physician [name], MD Debridement Type: Debridement Severity of Tissue Pre Debridement: Fat layer exposed Level of Consciousness (Pre-procedure): Awake and Alert Pre-procedure Verification/Time Out Yes - 16:30 Taken: Start Time: 16:30 T Area Debrided (L x W): otal 0.8 (cm) x 0.7 (cm) = 0.56 (cm) Tissue and other material debrided: Non-Viable, Callus, Skin: Epidermis Level: Skin/Epidermis Debridement Description: Selective/Open Wound Instrument: Curette Bleeding: Minimum Hemostasis Achieved: Pressure End Time: 16:32 Procedural Pain: 0 Post Procedural Pain: 0 Response to Treatment: Procedure was tolerated well Level of Consciousness (Post- Awake and Alert procedure): Post Debridement Measurements of Total Wound Length: (cm) 0.8 Width: (cm) 0.7 Depth: (cm) 0.2 Volume: (cm) 0.088 Character of Wound/Ulcer Post Debridement: Improved Severity of Tissue Post Debridement: Fat layer exposed Post Procedure Diagnosis Same as Pre-procedure Electronic Signature(s) Signed: 11/18/2019 5:53:04 PM By: [name] MD Signed: 11/21/2019 5:36:57 PM By: [name] RN, BSN Previous Signature: 11/18/2019 5:14:32 PM Version By: [name] RN, BSN Entered By: [name] on 11/18/2019 17:25:45 -------------------------------------------------------------------------------- HPI Details Patient Name: Date of Service: [name], [name] 11/18/2019 3:15 PM Medical Record Number: [id number] Patient Account Number: [id number] Date of Birth/Sex: Treating RN: Oct 27, 1985 (34 y.o. 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 He comes in for review of a wound over the plantar fifth metatarsal head extending into the lateral part of the foot. He was followed for this previously by his podiatrist Dr. Cornelius [name]. As the patient tells his story he went  to see podiatry first for a swelling he developed on the lateral part of his fifth metatarsal head in May. He states this was "open" by podiatry and the area closed. He was followed up in June and it was again opened callus removed and it closed promptly. There were plans being made for surgery on the fifth metatarsal head in June however his blood sugar was apparently too high for anesthesia. Apparently the area was debrided and opened again in June and it is never closed since. Looking over the records from podiatry I am really not able to follow this. It was clear when he was first seen it was before 5/14 at that point he already had a wound. By 5/17 the ulcer was resolved. I do not see anything about a procedure. On 5/28 noted to have pre-ulcerative moderate keratosis. X-ray noted 1/5 contracted toe and tailor's bunion and metatarsal deformity. On a visit date on 09/28/2018 the dorsal part of the left foot it healed and resolved. There was concern about swelling in his lower extremity he was sent to the ER.. As far as I can tell he was seen in the ER on 7/12 with an ulcer on his left foot. A DVT rule out of the left leg was negative. I do not think I have complete records from podiatry but I am not able to verify the procedures this patient states he had. He states after the last procedure the wound has never closed although I am not able to follow this in the  records I have from podiatry. He has not had a recent x-ray The patient has been using Neosporin on the wound. He is wearing a Darco shoe. He is still very active up on his foot working and exercising. Past medical history; type 2 diabetes ketosis-prone, leg swelling with a negative DVT study in July. Non-smoker ABI in our clinic was 0.85 on the left 10/16; substantial wound on the plantar left fifth met head extending laterally almost to the dorsal fifth MTP. We have been using silver alginate we gave him a Darco forefoot off loader. An x-ray did not show evidence of osteomyelitis did note soft tissue emphysema which I think was due to gas tracking through an open wound. There is no doubt in my mind he requires an MRI 10/23; MRI not booked until 3 November at the earliest this is largely due to his glucose sensor in the right arm. We have been using silver alginate. There has been an improvement 10/29; I am still not exactly sure when his MRI is booked for. He says it is the third but it is the 10th in epic. This definitely needs to be done. He is running a low-grade fever today but no other symptoms. No real improvement in the 1 02/26/2019 patient presents today for a follow-up visit here in our clinic he is last been seen in the clinic on October 29. Subsequently we were working on getting MRI to evaluate and see what exactly was going on and where we would need to go from the standpoint of whether or not he had osteomyelitis and again what treatments were going be required. Subsequently the patient ended up being admitted to the hospital on 02/07/2019 and was discharged on 02/14/2019. This is a somewhat interesting admission with a discharge diagnosis of pneumonia due to COVID-19 although he was positive for COVID-19 when tested at the urgent care but negative x2 when he was actually in the hospital. With that being  said he did have acute respiratory failure with hypoxia and it was noted he also  have a left foot ulceration with osteomyelitis. With that being said he did require oxygen for his pneumonia and I level 4 L. He was placed on antivirals and steroids for the COVID-19. He was also transferred to the [facility] at one point. Nonetheless he did subsequently discharged home and since being home has done much better in that regard. The CT angiogram did not show any pulmonary embolism. With regard to the osteomyelitis the patient was placed on vancomycin and Zosyn while in the hospital but has been changed to Augmentin at discharge. It was also recommended that he follow- up with wound care and podiatry. Podiatry however wanted him to see [name] according to the patient prior to them doing anything further. His hemoglobin A1c was 9.9 as noted in the hospital. Have an MRI of the left foot performed while in the hospital on 02/04/2019. 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 There is an overlying plantar open wound noted an abscess tracking back along the lateral aspect of the fifth metatarsal shaft. There is otherwise diffuse cellulitis and mild fasciitis without findings of polymyositis. The patient did have recently pneumonia secondary to COVID-19 I looked in the chart through epic and it does appear that the patient may need to have an additional x-ray just to ensure everything is cleared and that he has no airspace disease prior to putting him into the [name]. 03/05/2019; patient was readmitted to the clinic last week. He was hospitalized twice for a viral upper respiratory tract infection from 11/1 through 11/4 and then 11/5 through 11/12 ultimately this turned out to be Covid pneumonitis. Although he was discharged on oxygen he is not using it. He says he feels fine. He has no exercise limitation no cough no sputum. His O2 sat in our clinic today was 100% on room air. He did manage to  have his MRI which showed septic arthritis at the fifth MTP joint and osteomyelitis involving the fifth metatarsal head and proximal phalanx. He received Vanco and Zosyn in the hospital and then was discharged on 2 weeks of Augmentin. I do not see any relevant cultures. He was supposed to follow-up with infectious disease but I do not see that he has an appointment. 12/8; patient saw Dr. Novella Olive of infectious disease last week. He felt that he had had adequate antibiotic therapy. He did not go to follow-up with Dr. Amalia Hailey of podiatry and I have again talked to him about the pros and cons of this. He does not want to consider a ray amputation of this time. He is aware of the risks of recurrence, migration etc. He started HBO today and tolerated this well. He can complete the Augmentin that I gave him last week. I have looked over the lab work that Dr. Chana Bode ordered his C-reactive protein was 3.3 and his sedimentation rate was 17. The C-reactive protein is never really been measurably that high in this patient 12/15; not much change in the wound today however he has undermining along the lateral part of the foot again more extensively than last week. He has some rims of epithelialization. We have been using silver alginate. He is undergoing hyperbarics but did not dive today 12/18; in for his obligatory first total contact cast change. Unfortunately there was pus coming from the undermining area around his  fifth metatarsal head. This was cultured but will preclude reapplication of a cast. He is seen in conjunction with HBO 12/24; patient had staph lugdunensis in the wound in the undermining area laterally last time. We put him on doxycycline which should have covered this. The wound looks better today. I am going to give him another week of doxycycline before reattempting the total contact cast 12/31; the patient is completing antibiotics. Hemorrhagic debris in the distal part of the wound with some  undermining distally. He also had hyper granulation. Extensive debridement with a #5 curette. The infected area that was on the lateral part of the fifth met head is closed over. I do not think he needs any more antibiotics. Patient was seen prior to HBO. Preparations for a total contact cast were made in the cast will be placed post hyperbarics 04/11/19; once again the patient arrives today without complaint. He had been in a cast all week noted that he had heavy drainage this week. This resulted in large raised areas of macerated tissue around the wound 1/14; wound bed looks better slightly smaller. Hydrofera Blue has been changing himself. He had a heavy drainage last week which caused a lot of maceration around the wound so I took him out of a total contact cast he says the drainage is actually better this week He is seen today in conjunction with HBO 1/21; returns to clinic. He was up in [place] for a day or 2 attending a funeral. He comes back in with the wound larger and with a large area of exposed bone. He had osteomyelitis and septic arthritis of the fifth left metatarsal head while he was in hospital. He received IV antibiotics in the hospital for a prolonged period of time then 3 weeks of Augmentin. Subsequently I gave him 2 weeks of doxycycline for more superficial wound infection. When I saw this last week the wound was smaller the surface of the wound looks satisfactory. 1/28; patient missed hyperbarics today. Bone biopsy I did last time showed Enterococcus faecalis and Staphylococcus lugdunensis . He has a wide area of exposed bone. We are going to use silver alginate as of today. I had another ethical discussion with the patient. 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 In this situation I think the likelihood of healing this is low. Therefore I have recommended a ray amputation and with the patient's agreement I have referred him to Dr. Doran Durand. The  other issue is that his compliance with hyperbarics has been minimal because of his work schedule and given his underlying decision I am going to stop this today READMISSION 10/24/2019 MRI 09/29/2019 left foot IMPRESSION: 1. Apparent skin ulceration inferior and lateral to the 5th metatarsal base with underlying heterogeneous T2 signal and enhancement in the subcutaneous fat. Small peripherally enhancing fluid collections along the plantar and lateral aspects of the 5th metatarsal base suspicious for abscesses. 2. Interval amputation through the mid 5th metatarsal with nonspecific low-level marrow edema and enhancement. Given the proximity to the adjacent soft tissue inflammatory changes, osteomyelitis cannot be excluded. 3. The additional bones appear unremarkable. MRI 09/29/2019 right foot IMPRESSION: 1. Soft tissue ulceration lateral to the 5th MTP joint. There is low-level T2 hyperintensity within the 4th and 5th metatarsal heads and adjacent proximal phalanges without abnormal T1 signal or cortical destruction. These findings are nonspecific and could be seen with early marrow edema, hyperemia or early osteomyelitis. No evidence of septic joint. 2. Mild tenosynovitis and synovial  enhancement associated with the extensor digitorum tendons at the level of the midfoot. 3. Diffuse low-level muscular T2 hyperintensity and enhancement, most consistent with diabetic myopathy. LEFT FOOT BONE Methicillin resistant staphylococcus aureus Staphylococcus lugdunensis MIC MIC CIPROFLOXACIN >=8 RESISTANT Resistant <=0.5 SENSI... Sensitive CLINDAMYCIN <=0.25 SENS... Sensitive >=8 RESISTANT Resistant ERYTHROMYCIN >=8 RESISTANT Resistant >=8 RESISTANT Resistant GENTAMICIN <=0.5 SENSI... Sensitive <=0.5 SENSI... Sensitive Inducible Clindamycin NEGATIVE Sensitive NEGATIVE Sensitive OXACILLIN >=4 RESISTANT Resistant 2 SENSITIVE Sensitive RIFAMPIN <=0.5 SENSI... Sensitive <=0.5 SENSI...  Sensitive TETRACYCLINE <=1 SENSITIVE Sensitive <=1 SENSITIVE Sensitive TRIMETH/SULFA <=10 SENSIT Sensitive <=10 SENSIT Sensitive ... Marland Kitchen.. VANCOMYCIN 1 SENSITIVE Sensitive <=0.5 SENSI... Sensitive Right foot bone . Component 3 wk ago Specimen Description BONE Special Requests RIGHT 4 METATARSAL SAMPLE B Gram Stain NO WBC SEEN NO ORGANISMS SEEN Culture RARE METHICILLIN RESISTANT STAPHYLOCOCCUS AUREUS NO ANAEROBES ISOLATED Performed at [facility], [place] [address] [place], [place] [address] Report Status 10/08/2019 FINAL Organism ID, Bacteria METHICILLIN RESISTANT STAPHYLOCOCCUS AUREUS Resulting Agency [facility] Susceptibility Methicillin resistant staphylococcus aureus MIC CIPROFLOXACIN >=8 RESISTANT Resistant CLINDAMYCIN <=0.25 SENS... Sensitive ERYTHROMYCIN >=8 RESISTANT Resistant GENTAMICIN <=0.5 SENSI... Sensitive Inducible Clindamycin NEGATIVE Sensitive OXACILLIN >=4 RESISTANT Resistant RIFAMPIN <=0.5 SENSI... Sensitive TETRACYCLINE <=1 SENSITIVE Sensitive TRIMETH/SULFA <=10 SENSIT Sensitive ... VANCOMYCIN 1 SENSITIVE Sensitive This is a patient we had in clinic earlier this year with a wound over his left fifth metatarsal head. 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 Doran Durand on 05/16/2019. He seems to have developed problems with wounds on his bilateral feet in June 2021 although he may have had problems earlier than this. He was in an urgent care with a right foot ulcer on 09/26/2019 and given a course of doxycycline. This was apparently after having trouble getting into see orthopedics. He was seen by podiatry on 09/28/2019 noted to have bilateral lower extremity ulcers including the left  lateral fifth metatarsal base and the right subfifth met head. It was noted that had purulent drainage at that time. He required hospitalization from 6/20 through 7/2. This was because of worsening right foot wounds. He underwent bilateral operative incision and drainage and bone biopsies bilaterally. Culture results are listed above. He has been referred back to clinic by Dr. Jacqualyn Posey of podiatry. He is also followed by Dr. Megan Salon who saw him yesterday. He was discharged from hospital on Zyvox Flagyl and Levaquin and yesterday changed to doxycycline Flagyl and Levaquin. His inflammatory markers on 6/26 showed a sedimentation rate of 129 and a C-reactive protein of 5. This is improved to 14 and 1.3 respectively. This would indicate improvement. ABIs in our clinic today were 1.23 on the right and 1.20 on the left 11/01/2019 on evaluation today patient appears to be doing fairly well in regard to the wounds on his feet at this point. Fortunately there is no signs of active infection at this time. No fevers, chills, nausea, vomiting, or diarrhea. He currently is seeing infectious disease and still under their care at this point. Subsequently he also has both wounds which she has not been using collagen on as he did not receive that in his packaging he did not call us and let us know that. Apparently that just was missed on the order. Nonetheless we will get  that straightened out today. 8/9-Patient returns for bilateral foot wounds, using Prisma with hydrogel moistened dressings, and the wounds appear stable. Patient using surgical shoes, avoiding much pressure or weightbearing as much as possible 8/16; patient has bilateral foot wounds. 1 on the right lateral foot proximally the other is on the left mid lateral foot. Both required debridement of callus and thick skin around the wounds. We have been using silver collagen Electronic Signature(s) Signed: 11/18/2019 5:53:04 PM By: [name] MD Entered  By: [name] on 11/18/2019 17:27:06 -------------------------------------------------------------------------------- Physical Exam Details Patient Name: Date of Service: [name], [name] 11/18/2019 3:15 PM Medical Record Number: [id number] Patient Account Number: [id number] Date of Birth/Sex: Treating RN: 02/07/1986 (34 y.o. [name] Primary Care Provider: [name] Other Clinician: Referring Provider: Treating Provider/Extender: Elayne Snare Weeks in Treatment: 3 Constitutional Sitting or standing Blood Pressure is within target range for patient.. Pulse regular and within target range for patient.Marland Kitchen Respirations regular, non-labored and within target range.. Temperature is normal and within the target range for the patient.Marland Kitchen Appears in no distress. Cardiovascular Pedal pulses are palpable bilaterally. Notes 8/16; wound exam. Left lateral foot wound and the right lateral foot wound both look generally healthy albeit somewhat dry. Thick surrounding skin and and callus debrided with a #5 curette to see if we can get a healthier wound edge. Electronic Signature(s) Signed: 11/18/2019 5:53:04 PM By: [name] MD Entered By: [name] on 11/18/2019 17:29:04 -------------------------------------------------------------------------------- Physician Orders Details Patient Name: Date of Service: [name], [name] 11/18/2019 3:15 PM Medical Record Number: [id number] Patient Account Number: [id number] Date of Birth/Sex: Treating RN: 1985/12/09 (34 y.o. 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 Dressing Change Frequency Wound #2 Right,Lateral Foot Change Dressing every other day. Wound #3 Left,Lateral Foot Change Dressing every other day. Skin Barriers/Peri-Wound Care Moisturizing lotion - both legs and feet Wound Cleansing May shower and wash wound with soap and water. - on days that dressing is changed Primary Wound Dressing Wound #2 Right,Lateral Foot Hydrofera Blue Wound #3 Left,Lateral Foot Hydrofera Blue Secondary Dressing Wound #2 Right,Lateral Foot Foam - foam donut Kerlix/Rolled Gauze Dry Gauze Wound #3 Left,Lateral Foot Foam - foam donut Kerlix/Rolled Gauze Dry Gauze Off-Loading Open toe surgical shoe to: - both feet Electronic Signature(s) Signed: 11/18/2019 5:14:32 PM By: [name] RN, BSN Signed: 11/18/2019 5:53:04 PM By: [name] MD Entered By: [name] on 11/18/2019 16:35:55 -------------------------------------------------------------------------------- Problem List Details Patient Name: Date of Service: [name], [name] 11/18/2019 3:15 PM Medical Record Number: [id number] Patient Account Number: [id number] Date of Birth/Sex: Treating RN: 12-26-1985 (34 y.o. 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, left ankle and foot 10/24/2019 No Yes B95.62 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 10/24/2019 No Yes classified elsewhere Inactive Problems Resolved Problems Electronic Signature(s) Signed: 11/18/2019 5:53:04 PM By: [name] MD Previous Signature: 11/18/2019 5:14:32 PM Version By: [name] RN, BSN Entered By: [name] on 11/18/2019 17:20:14 -------------------------------------------------------------------------------- Progress Note Details Patient Name: Date of Service: [name], [name] 11/18/2019 3:15 PM Medical Record Number: [id number] Patient Account Number: [id number] Date of Birth/Sex: Treating RN: 08-Mar-1986 (34 y.o. [name] Primary Care Provider: [name] Other Clinician: Referring Provider: Treating Provider/Extender: Delton See in Treatment: 3 Subjective History of Present Illness (HPI) ADMISSION 01/11/2019 This is a 34 year old man who works as a Architect. He comes in for review of a wound over the plantar fifth metatarsal head extending into the lateral part of the foot. He was followed for this previously by his podiatrist Dr. Cornelius [name]. As the patient tells his story he went to see podiatry first for a swelling he developed on the lateral part of his fifth metatarsal head in May. He states this was "open" by podiatry and the area closed. He was followed up in June and it was again opened callus removed and it closed promptly. There were plans being made for surgery on the fifth metatarsal head in June however his blood sugar was apparently too high for  anesthesia. Apparently the area was debrided and opened again in June and it is never closed since. Looking over the records from podiatry I am really not able to follow this. It was clear when he was first seen it was before 5/14 at that point he already had a wound. By 5/17 the ulcer was resolved. I do not see anything about a procedure. On 5/28 noted to have pre-ulcerative moderate keratosis. X-ray noted 1/5 contracted toe and tailor's bunion and metatarsal deformity. On a visit date on 09/28/2018 the dorsal part of the left foot it healed and resolved. There was concern about swelling in his lower extremity he was sent to the ER.. As far as I can tell he was seen in the ER on 7/12 with an ulcer on his left foot. A DVT rule out of the left leg was negative. I do not think I have complete records from podiatry but I am not able to verify the procedures this patient states he had. He states after the last procedure the wound has never closed although I am not able to follow this in the records I have from podiatry. He has not had a recent x-ray The patient has been using Neosporin on the wound. He is wearing a Darco shoe. He is still very active up on his foot working and exercising. Past medical history; type 2 diabetes ketosis-prone, leg swelling with a negative DVT study in July. Non-smoker ABI in our clinic was 0.85 on the left 10/16; substantial wound on the plantar left fifth met head extending laterally almost to the dorsal fifth MTP. We have been using silver alginate we gave him a Darco forefoot off loader. An x-ray did not show evidence of osteomyelitis did note soft tissue  emphysema which I think was due to gas tracking through an open wound. There is no doubt in my mind he requires an MRI 10/23; MRI not booked until 3 November at the earliest this is largely due to his glucose sensor in the right arm. We have been using silver alginate. There has been an improvement 10/29; I am still not  exactly sure when his MRI is booked for. He says it is the third but it is the 10th in epic. This definitely needs to be done. He is running a low-grade fever today but no other symptoms. No real improvement in the 1 02/26/2019 patient presents today for a follow-up visit here in our clinic he is last been seen in the clinic on October 29. Subsequently we were working on getting MRI to evaluate and see what exactly was going on and where we would need to go from the standpoint of whether or not he had osteomyelitis and again what treatments were going be required. Subsequently the patient ended up being admitted to the hospital on 02/07/2019 and was discharged on 02/14/2019. This is a somewhat interesting admission with a discharge diagnosis of pneumonia due to COVID-19 although he was positive for COVID-19 when tested at the urgent care but negative x2 when he was actually in the hospital. With that being said he did have acute respiratory failure with hypoxia and it was noted he also have a left foot ulceration with osteomyelitis. With that being said he did require oxygen for his pneumonia and I level 4 L. He was placed on antivirals and steroids for the COVID-19. He was also transferred to the [facility] at one point. Nonetheless he did subsequently discharged home and since being home has done much better in that regard. The CT angiogram did not show any pulmonary embolism. With regard to the osteomyelitis the patient was placed on vancomycin and Zosyn while in the hospital but has been changed to Augmentin at discharge. It was also recommended that he follow- up with wound care and podiatry. Podiatry however wanted him to see [name] according to the patient prior to them doing anything further. His hemoglobin A1c was 9.9 as noted in the hospital. Have an MRI of the left foot performed while in the hospital on 02/04/2019. 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 There is an overlying plantar open wound noted an abscess tracking back along the lateral aspect of the fifth metatarsal shaft. There is otherwise diffuse cellulitis and mild fasciitis without findings of polymyositis. The patient did have recently pneumonia secondary to COVID-19 I looked in the chart through epic and it does appear that the patient may need to have an additional x-ray just to ensure everything is cleared and that he has no airspace disease prior to putting him into the [name]. 03/05/2019; patient was readmitted to the clinic last week. He was hospitalized twice for a viral upper respiratory tract infection from 11/1 through 11/4 and then 11/5 through 11/12 ultimately this turned out to be Covid pneumonitis. Although he was discharged on oxygen he is not using it. He says he feels fine. He has no exercise limitation no cough no sputum. His O2 sat in our clinic today was 100% on room air. 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 He received  Vanco and Zosyn in the hospital and then was discharged on 2 weeks of Augmentin. I do not see any relevant cultures. He was supposed to follow-up with infectious disease but I do not see that he has an appointment. 12/8; patient saw Dr. Novella Olive of infectious disease last week. He felt that he had had adequate antibiotic therapy. He did not go to follow-up with Dr. Amalia Hailey of podiatry and I have again talked to him about the pros and cons of this. He does not want to consider a ray amputation of this time. He is aware of the risks of recurrence, migration etc. He started HBO today and tolerated this well. He can complete the Augmentin that I gave him last week. I have looked over the lab work that Dr. Chana Bode ordered his C-reactive protein was 3.3 and his sedimentation  rate was 17. The C-reactive protein is never really been measurably that high in this patient 12/15; not much change in the wound today however he has undermining along the lateral part of the foot again more extensively than last week. He has some rims of epithelialization. We have been using silver alginate. He is undergoing hyperbarics but did not dive today 12/18; in for his obligatory first total contact cast change. Unfortunately there was pus coming from the undermining area around his fifth metatarsal head. This was cultured but will preclude reapplication of a cast. He is seen in conjunction with HBO 12/24; patient had staph lugdunensis in the wound in the undermining area laterally last time. We put him on doxycycline which should have covered this. The wound looks better today. I am going to give him another week of doxycycline before reattempting the total contact cast 12/31; the patient is completing antibiotics. Hemorrhagic debris in the distal part of the wound with some undermining distally. He also had hyper granulation. Extensive debridement with a #5 curette. The infected area that was on the lateral part of the fifth met head is closed over. I do not think he needs any more antibiotics. Patient was seen prior to HBO. Preparations for a total contact cast were made in the cast will be placed post hyperbarics 04/11/19; once again the patient arrives today without complaint. He had been in a cast all week noted that he had heavy drainage this week. This resulted in large raised areas of macerated tissue around the wound 1/14; wound bed looks better slightly smaller. Hydrofera Blue has been changing himself. He had a heavy drainage last week which caused a lot of maceration around the wound so I took him out of a total contact cast he says the drainage is actually better this week He is seen today in conjunction with HBO 1/21; returns to clinic. He was up in [place] for a day or 2  attending a funeral. He comes back in with the wound larger and with a large area of exposed bone. He had osteomyelitis and septic arthritis of the fifth left metatarsal head while he was in hospital. He received IV antibiotics in the hospital for a prolonged period of time then 3 weeks of Augmentin. Subsequently I gave him 2 weeks of doxycycline for more superficial wound infection. When I saw this last week the wound was smaller the surface of the wound looks satisfactory. 1/28; patient missed hyperbarics today. Bone biopsy I did last time showed Enterococcus faecalis and Staphylococcus lugdunensis . He has a wide area of exposed bone. We are going to use silver alginate as of today.  I had another ethical discussion with the patient. 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 In this situation I think the likelihood of healing this is low. Therefore I have recommended a ray amputation and with the patient's agreement I have referred him to Dr. Doran Durand. The other issue is that his compliance with hyperbarics has been minimal because of his work schedule and given his underlying decision I am going to stop this today READMISSION 10/24/2019 MRI 09/29/2019 left foot IMPRESSION: 1. Apparent skin ulceration inferior and lateral to the 5th metatarsal base with underlying heterogeneous T2 signal and enhancement in the subcutaneous fat. Small peripherally enhancing fluid collections along the plantar and lateral aspects of the 5th metatarsal base suspicious for abscesses. 2. Interval amputation through the mid 5th metatarsal with nonspecific low-level marrow edema and enhancement. Given the proximity to the adjacent soft tissue inflammatory changes, osteomyelitis cannot be excluded. 3. The additional bones appear unremarkable. MRI 09/29/2019 right foot IMPRESSION: 1. Soft tissue ulceration lateral to the 5th MTP joint. There is low-level T2 hyperintensity within the  4th and 5th metatarsal heads and adjacent proximal phalanges without abnormal T1 signal or cortical destruction. These findings are nonspecific and could be seen with early marrow edema, hyperemia or early osteomyelitis. No evidence of septic joint. 2. Mild tenosynovitis and synovial enhancement associated with the extensor digitorum tendons at the level of the midfoot. 3. Diffuse low-level muscular T2 hyperintensity and enhancement, most consistent with diabetic myopathy. LEFT FOOT BONE Methicillin resistant staphylococcus aureus Staphylococcus lugdunensis MIC MIC CIPROFLOXACIN >=8 RESISTANT Resistant <=0.5 SENSI... Sensitive CLINDAMYCIN <=0.25 SENS... Sensitive >=8 RESISTANT Resistant ERYTHROMYCIN >=8 RESISTANT Resistant >=8 RESISTANT Resistant GENTAMICIN <=0.5 SENSI... Sensitive <=0.5 SENSI... Sensitive Inducible Clindamycin NEGATIVE Sensitive NEGATIVE Sensitive OXACILLIN >=4 RESISTANT Resistant 2 SENSITIVE Sensitive RIFAMPIN <=0.5 SENSI... Sensitive <=0.5 SENSI... Sensitive TETRACYCLINE <=1 SENSITIVE Sensitive <=1 SENSITIVE Sensitive TRIMETH/SULFA <=10 SENSIT Sensitive <=10 SENSIT Sensitive ... Marland Kitchen.. VANCOMYCIN 1 SENSITIVE Sensitive <=0.5 SENSI... Sensitive Right foot bone . Component 3 wk ago Specimen Description BONE Special Requests RIGHT 4 METATARSAL SAMPLE B Gram Stain NO WBC SEEN NO ORGANISMS SEEN Culture RARE METHICILLIN RESISTANT STAPHYLOCOCCUS AUREUS NO ANAEROBES ISOLATED Performed at [facility], [place] [address], [place], [place] [address] Report Status 10/08/2019 FINAL Organism ID, Bacteria METHICILLIN RESISTANT STAPHYLOCOCCUS AUREUS Resulting Agency [facility] Susceptibility Methicillin resistant staphylococcus aureus MIC CIPROFLOXACIN >=8 RESISTANT Resistant CLINDAMYCIN <=0.25 SENS... Sensitive ERYTHROMYCIN >=8 RESISTANT Resistant GENTAMICIN <=0.5 SENSI... Sensitive Inducible Clindamycin NEGATIVE Sensitive OXACILLIN >=4 RESISTANT Resistant RIFAMPIN  <=0.5 SENSI... Sensitive TETRACYCLINE <=1 SENSITIVE Sensitive TRIMETH/SULFA <=10 SENSIT Sensitive ... VANCOMYCIN 1 SENSITIVE Sensitive This is a patient we had in clinic earlier this year with a wound over his left fifth metatarsal head. 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 Doran Durand on 05/16/2019. He seems to have developed problems with wounds on his bilateral feet in June 2021 although he may have had problems earlier than this. He was in an urgent care with a right foot ulcer on 09/26/2019 and given a course of doxycycline. This was apparently after having trouble getting into see orthopedics. He was seen by podiatry on 09/28/2019 noted to have bilateral lower extremity ulcers including the left lateral fifth metatarsal base and the right subfifth  met head. It was noted that had purulent drainage at that time. He required hospitalization from 6/20 through 7/2. This was because of worsening right foot wounds. He underwent bilateral operative incision and drainage and bone biopsies bilaterally. Culture results are listed above. He has been referred back to clinic by Dr. Jacqualyn Posey of podiatry. He is also followed by Dr. Megan Salon who saw him yesterday. He was discharged from hospital on Zyvox Flagyl and Levaquin and yesterday changed to doxycycline Flagyl and Levaquin. His inflammatory markers on 6/26 showed a sedimentation rate of 129 and a C-reactive protein of 5. This is improved to 14 and 1.3 respectively. This would indicate improvement. ABIs in our clinic today were 1.23 on the right and 1.20 on the left 11/01/2019 on evaluation today patient appears to be doing fairly well in regard to the wounds on his feet at this  point. Fortunately there is no signs of active infection at this time. No fevers, chills, nausea, vomiting, or diarrhea. He currently is seeing infectious disease and still under their care at this point. Subsequently he also has both wounds which she has not been using collagen on as he did not receive that in his packaging he did not call us and let us know that. Apparently that just was missed on the order. Nonetheless we will get that straightened out today. 8/9-Patient returns for bilateral foot wounds, using Prisma with hydrogel moistened dressings, and the wounds appear stable. Patient using surgical shoes, avoiding much pressure or weightbearing as much as possible 8/16; patient has bilateral foot wounds. 1 on the right lateral foot proximally the other is on the left mid lateral foot. Both required debridement of callus and thick skin around the wounds. We have been using silver collagen Objective Constitutional Sitting or standing Blood Pressure is within target range for patient.. Pulse regular and within target range for patient.Marland Kitchen Respirations regular, non-labored and within target range.. Temperature is normal and within the target range for the patient.Marland Kitchen Appears in no distress. Vitals Time Taken: 3:35 PM, Height: 77 in, Weight: 280 lbs, BMI: 33.2, Temperature: 98.4 F, Pulse: 111 bpm, Respiratory Rate: 17 breaths/min, Blood Pressure: 138/83 mmHg. Cardiovascular Pedal pulses are palpable bilaterally. General Notes: 8/16; wound exam. Left lateral foot wound and the right lateral foot wound both look generally healthy albeit somewhat dry. Thick surrounding skin and and callus debrided with a #5 curette to see if we can get a healthier wound edge. Integumentary (Hair, Skin) Wound #2 status is Open. Original cause of wound was Gradually Appeared. The wound is located on the Right,Lateral Foot. The wound measures 1.9cm length x 1.5cm width x 0.2cm depth; 2.238cm^2 area and 0.448cm^3  volume. There is Fat Layer (Subcutaneous Tissue) Exposed exposed. There is no tunneling or undermining noted. There is a medium amount of serosanguineous drainage noted. The wound margin is flat and intact. There is large (67-100%) red granulation within the wound bed. There is a small (1-33%) amount of necrotic tissue within the wound bed including Adherent Slough. Wound #3 status is Open. Original cause of wound was Trauma. The wound is located on the Left,Lateral Foot. The wound measures 0.8cm length x 0.7cm width x 0.2cm depth; 0.44cm^2 area and 0.088cm^3 volume. There is Fat Layer (Subcutaneous Tissue) Exposed exposed. There is no tunneling or undermining noted. There is a medium amount of serosanguineous drainage noted. The wound margin is flat and intact. There is large (67-100%) red granulation within the wound bed. There is no necrotic tissue within the  wound bed. 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 There was a Selective/Open Wound Skin/Epidermis Debridement with a total area of 2.85 sq cm performed by [name], MD. With the following instrument(s): Curette to remove Non-Viable tissue/material. Material removed includes Callus and Skin: Epidermis and. No specimens were taken. A time out was conducted at 16:30, prior to the start of the procedure. A Minimum amount of  bleeding was controlled with Pressure. The procedure was tolerated well with a pain level of 0 throughout and a pain level of 0 following the procedure. Post Debridement Measurements: 1.9cm length x 1.5cm width x 0.2cm depth; 0.448cm^3 volume. Character of Wound/Ulcer Post Debridement is improved. Severity of Tissue Post Debridement is: Fat layer exposed. Post procedure Diagnosis Wound #2: Same as Pre-Procedure Wound #3 Pre-procedure diagnosis of Wound #3 is a Diabetic Wound/Ulcer of the Lower Extremity located on the Left,Lateral Foot .Severity of Tissue Pre Debridement is: Fat layer exposed. There was a Selective/Open Wound Skin/Epidermis Debridement with a total area of 0.56 sq cm performed by [name], MD. With the following instrument(s): Curette to remove Non-Viable tissue/material. Material removed includes Callus and Skin: Epidermis and. No specimens were taken. A time out was conducted at 16:30, prior to the start of the procedure. A Minimum amount of bleeding was controlled with Pressure. The procedure was tolerated well with a pain level of 0 throughout and a pain level of 0 following the procedure. Post Debridement Measurements: 0.8cm length x 0.7cm width x 0.2cm depth; 0.088cm^3 volume. Character of Wound/Ulcer Post Debridement is improved. Severity of Tissue Post Debridement is: Fat layer exposed. Post procedure Diagnosis Wound #3: Same as Pre-Procedure Plan Follow-up Appointments: Return Appointment in 2 weeks. Dressing Change Frequency: Wound #2 Right,Lateral Foot: Change Dressing every other day. Wound #3 Left,Lateral Foot: Change Dressing every other day. Skin Barriers/Peri-Wound Care: Moisturizing lotion - both legs and feet Wound Cleansing: May shower and wash wound with soap and water. - on days that dressing is changed Primary Wound Dressing: Wound #2 Right,Lateral Foot: Hydrofera Blue Wound #3 Left,Lateral Foot: Hydrofera Blue Secondary Dressing: Wound  #2 Right,Lateral Foot: Foam - foam donut Kerlix/Rolled Gauze Dry Gauze Wound #3 Left,Lateral Foot: Foam - foam donut Kerlix/Rolled Gauze Dry Gauze Off-Loading: Open toe surgical shoe to: - both feet 1. I change the primary dressing to [facility]. He is using surgical shoes, careful to offload 2. Follow-up in 2 weeks. Electronic Signature(s) Signed: 11/18/2019 5:53:04 PM By: [name] MD Entered By: [name] on 11/18/2019 17:29:58 -------------------------------------------------------------------------------- SuperBill Details Patient Name: Date of Service: [name], [name] 11/18/2019 Medical Record Number: [id number] Patient Account Number: [id number] Date of Birth/Sex: Treating RN: [id number] (34 y.o. 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ity 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, right ankle and foot 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, left ankle and foot B95.62 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 classified elsewhere Facility Procedures CPT4 Code: [id number] Description: [phone] - DEBRIDE WOUND 1ST 20 SQ CM OR < ICD-10 Diagnosis Description L97.518 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i L97.528 Non-pressure chronic ulcer of other part of left foot with other  specified severit Modifier: ty y Quantity: 1 Physician Procedures : CPT4 Code Description Modifier [id number] - WC PHYS DEBR WO ANESTH 20 SQ CM ICD-10 Diagnosis Description L97.518 Non-pressure  chronic ulcer of other part of right foot with other specified severity L97.528 Non-pressure chronic ulcer of other part of  left foot with other specified severity Quantity: 1 Electronic Signature(s) Signed: 11/18/2019 5:53:04 PM By: [name] MD Entered By: [name] on 11/18/2019 17:30:24

## 2019-11-27 ENCOUNTER — Telehealth: Payer: Self-pay | Admitting: Nutrition

## 2019-11-27 NOTE — Telephone Encounter (Signed)
LVM that I am calling to schedule an appointment to discuss insulin pump therapy.  Telephone number given to call if he is interested in coming in to discuss this.

## 2019-11-29 ENCOUNTER — Encounter (HOSPITAL_BASED_OUTPATIENT_CLINIC_OR_DEPARTMENT_OTHER): Payer: BC Managed Care – PPO | Admitting: Internal Medicine

## 2019-11-29 ENCOUNTER — Other Ambulatory Visit: Payer: Self-pay

## 2019-11-29 DIAGNOSIS — E11621 Type 2 diabetes mellitus with foot ulcer: Secondary | ICD-10-CM | POA: Diagnosis not present

## 2019-12-02 ENCOUNTER — Encounter (HOSPITAL_BASED_OUTPATIENT_CLINIC_OR_DEPARTMENT_OTHER): Payer: BC Managed Care – PPO | Admitting: Internal Medicine

## 2019-12-02 NOTE — Progress Notes (Signed)
[place], [name] ([id number]) Visit Report for 11/29/2019 Debridement Details Patient Name: Date of Service: [name] 11/29/2019 3:45 PM Medical Record Number: [id number] Patient Account Number: [id number] Date of Birth/Sex: Treating RN: October 15, 1985 (34 y.o. Marvis Repress Primary Care Provider: [name] Other Clinician: Referring Provider: Treating Provider/Extender: Delton See in Treatment: 5 Debridement Performed for Assessment: Wound #3 Left,Lateral Foot Performed By: Physician [name], MD Debridement Type: Debridement Severity of Tissue Pre Debridement: Fat layer exposed Level of Consciousness (Pre-procedure): Awake and Alert Pre-procedure Verification/Time Out Yes - 17:10 Taken: Start Time: 17:10 Pain Control: Other : benzocaine, 20% T Area Debrided (L x W): otal 1 (cm) x 1 (cm) = 1 (cm) Tissue and other material debrided: Callus, Skin: Dermis Level: Skin/Dermis Debridement Description: Selective/Open Wound Instrument: Curette Bleeding: Minimum Hemostasis Achieved: Pressure End Time: 17:11 Procedural Pain: 0 Post Procedural Pain: 0 Response to Treatment: Procedure was tolerated well Level of Consciousness (Post- Awake and Alert procedure): Post Debridement Measurements of Total Wound Length: (cm) 0.6 Width: (cm) 0.6 Depth: (cm) 0.1 Volume: (cm) 0.028 Character of Wound/Ulcer Post Debridement: Improved Severity of Tissue Post Debridement: Fat layer exposed Post Procedure Diagnosis Same as Pre-procedure Electronic Signature(s) Signed: 12/01/2019 7:39:18 AM By: [name] MD Signed: 12/02/2019 4:26:23 PM By: Kela Millin Previous Signature: 11/29/2019 5:56:33 PM Version By: Kela Millin Entered By: [name] on 12/01/2019 07:03:45 -------------------------------------------------------------------------------- HPI Details Patient Name: Date of Service: [name], [name] 11/29/2019  3:45 PM Medical Record Number: [id number] Patient Account Number: [id number] Date of Birth/Sex: Treating RN: Oct 29, 1985 (34 y.o. 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 He comes in for review of a wound over the plantar fifth metatarsal head extending into the lateral part of the foot. He was followed for this previously by his podiatrist Dr. Cornelius Moras. As the patient tells his story he went to see podiatry first for a swelling he developed on the lateral part of his fifth metatarsal head in May. He states this was "open" by podiatry and the area closed. He was followed up in June and it was again opened callus removed and it closed promptly. There were plans being made for surgery on the fifth metatarsal head in June however his blood sugar was apparently too high for anesthesia. Apparently the area was debrided and opened again in June and it is never closed since. Looking over the records from podiatry I am really not able to follow this. It was clear when he was first seen it was before 5/14 at that point he already had a wound. By 5/17 the ulcer was resolved. I do not see anything about a procedure. On 5/28 noted to have pre-ulcerative moderate keratosis. X-ray noted 1/5 contracted toe and tailor's bunion and metatarsal deformity. On a visit date on 09/28/2018 the dorsal part of the left foot it healed and resolved. There was concern about swelling in his lower extremity he was sent to the ER.. As far as I can tell he was seen in the ER on 7/12 with an ulcer on his left foot. A DVT rule out of the left leg was negative. I do not think I have complete records from podiatry but I am not able to verify the  procedures this patient states he had. He states after the last procedure the wound has never closed although I am not able to follow this in the records I have from podiatry. He has not had a recent x-ray The patient has been using Neosporin on the wound. He is wearing a Darco shoe. He is still very active up on his foot working and exercising. Past medical history; type 2 diabetes ketosis-prone, leg swelling with a negative DVT study in July. Non-smoker ABI in our clinic was 0.85 on the left 10/16; substantial wound on the plantar left fifth met head extending laterally almost to the dorsal fifth MTP. We have been using silver alginate we gave him a Darco forefoot off loader. An x-ray did not show evidence of osteomyelitis did note soft tissue emphysema which I think was due to gas tracking through an open wound. There is no doubt in my mind he requires an MRI 10/23; MRI not booked until 3 November at the earliest this is largely due to his glucose sensor in the right arm. We have been using silver alginate. There has been an improvement 10/29; I am still not exactly sure when his MRI is booked for. He says it is the third but it is the 10th in epic. This definitely needs to be done. He is running a low-grade fever today but no other symptoms. No real improvement in the 1 02/26/2019 patient presents today for a follow-up visit here in our clinic he is last been seen in the clinic on October 29. Subsequently we were working on getting MRI to evaluate and see what exactly was going on and where we would need to go from the standpoint of whether or not he had osteomyelitis and again what treatments were going be required. Subsequently the patient ended up being admitted to the hospital on 02/07/2019 and was discharged on 02/14/2019. This is a somewhat interesting admission with a discharge diagnosis of pneumonia due to COVID-19 although he was positive for COVID-19 when tested at the urgent care but  negative x2 when he was actually in the hospital. With that being said he did have acute respiratory failure with hypoxia and it was noted he also have a left foot ulceration with osteomyelitis. With that being said he did require oxygen for his pneumonia and I level 4 L. He was placed on antivirals and steroids for the COVID-19. He was also transferred to the [facility] at one point. Nonetheless he did subsequently discharged home and since being home has done much better in that regard. The CT angiogram did not show any pulmonary embolism. With regard to the osteomyelitis the patient was placed on vancomycin and Zosyn while in the hospital but has been changed to Augmentin at discharge. It was also recommended that he follow- up with wound care and podiatry. Podiatry however wanted him to see [name] according to the patient prior to them doing anything further. His hemoglobin A1c was 9.9 as noted in the hospital. Have an MRI of the left foot performed while in the hospital on 02/04/2019. 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 There is an overlying plantar open wound noted an abscess tracking back along the lateral aspect of the fifth metatarsal shaft. There is otherwise diffuse cellulitis and mild fasciitis without findings of polymyositis. The patient did have recently pneumonia secondary to COVID-19 I looked in the chart through epic and  it does appear that the patient may need to have an additional x-ray just to ensure everything is cleared and that he has no airspace disease prior to putting him into the [name]. 03/05/2019; patient was readmitted to the clinic last week. He was hospitalized twice for a viral upper respiratory tract infection from 11/1 through 11/4 and then 11/5 through 11/12 ultimately this turned out to be Covid pneumonitis. Although he was discharged on oxygen he is not using  it. He says he feels fine. He has no exercise limitation no cough no sputum. His O2 sat in our clinic today was 100% on room air. 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 He received Vanco and Zosyn in the hospital and then was discharged on 2 weeks of Augmentin. I do not see any relevant cultures. He was supposed to follow-up with infectious disease but I do not see that he has an appointment. 12/8; patient saw Dr. Novella Olive of infectious disease last week. He felt that he had had adequate antibiotic therapy. He did not go to follow-up with Dr. Amalia Hailey of podiatry and I have again talked to him about the pros and cons of this. He does not want to consider a ray amputation of this time. He is aware of the risks of recurrence, migration etc. He started HBO today and tolerated this well. He can complete the Augmentin that I gave him last week. I have looked over the lab work that Dr. Chana Bode ordered his C-reactive protein was 3.3 and his sedimentation rate was 17. The C-reactive protein is never really been measurably that high in this patient 12/15; not much change in the wound today however he has undermining along the lateral part of the foot again more extensively than last week. He has some rims of epithelialization. We have been using silver alginate. He is undergoing hyperbarics but did not dive today 12/18; in for his obligatory first total contact cast change. Unfortunately there was pus coming from the undermining area around his fifth metatarsal head. This was cultured but will preclude reapplication of a cast. He is seen in conjunction with HBO 12/24; patient had staph lugdunensis in the wound in the undermining area laterally last time. We put him on doxycycline which should have covered this. The wound looks better today. I am going to give him another week of doxycycline before  reattempting the total contact cast 12/31; the patient is completing antibiotics. Hemorrhagic debris in the distal part of the wound with some undermining distally. He also had hyper granulation. Extensive debridement with a #5 curette. The infected area that was on the lateral part of the fifth met head is closed over. I do not think he needs any more antibiotics. Patient was seen prior to HBO. Preparations for a total contact cast were made in the cast will be placed post hyperbarics 04/11/19; once again the patient arrives today without complaint. He had been in a cast all week noted that he had heavy drainage this week. This resulted in large raised areas of macerated tissue around the wound 1/14; wound bed looks better slightly smaller. Hydrofera Blue has been changing himself. He had a heavy drainage last week which caused a lot of maceration around the wound so I took him out of a total contact cast he says the drainage is actually better this week He is seen today in conjunction with  HBO 1/21; returns to clinic. He was up in [place] for a day or 2 attending a funeral. He comes back in with the wound larger and with a large area of exposed bone. He had osteomyelitis and septic arthritis of the fifth left metatarsal head while he was in hospital. He received IV antibiotics in the hospital for a prolonged period of time then 3 weeks of Augmentin. Subsequently I gave him 2 weeks of doxycycline for more superficial wound infection. When I saw this last week the wound was smaller the surface of the wound looks satisfactory. 1/28; patient missed hyperbarics today. Bone biopsy I did last time showed Enterococcus faecalis and Staphylococcus lugdunensis . He has a wide area of exposed bone. We are going to use silver alginate as of today. I had another ethical discussion with the patient. 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 In this situation I think  the likelihood of healing this is low. Therefore I have recommended a ray amputation and with the patient's agreement I have referred him to Dr. Doran Durand. The other issue is that his compliance with hyperbarics has been minimal because of his work schedule and given his underlying decision I am going to stop this today READMISSION 10/24/2019 MRI 09/29/2019 left foot IMPRESSION: 1. Apparent skin ulceration inferior and lateral to the 5th metatarsal base with underlying heterogeneous T2 signal and enhancement in the subcutaneous fat. Small peripherally enhancing fluid collections along the plantar and lateral aspects of the 5th metatarsal base suspicious for abscesses. 2. Interval amputation through the mid 5th metatarsal with nonspecific low-level marrow edema and enhancement. Given the proximity to the adjacent soft tissue inflammatory changes, osteomyelitis cannot be excluded. 3. The additional bones appear unremarkable. MRI 09/29/2019 right foot IMPRESSION: 1. Soft tissue ulceration lateral to the 5th MTP joint. There is low-level T2 hyperintensity within the 4th and 5th metatarsal heads and adjacent proximal phalanges without abnormal T1 signal or cortical destruction. These findings are nonspecific and could be seen with early marrow edema, hyperemia or early osteomyelitis. No evidence of septic joint. 2. Mild tenosynovitis and synovial enhancement associated with the extensor digitorum tendons at the level of the midfoot. 3. Diffuse low-level muscular T2 hyperintensity and enhancement, most consistent with diabetic myopathy. LEFT FOOT BONE Methicillin resistant staphylococcus aureus Staphylococcus lugdunensis MIC MIC CIPROFLOXACIN >=8 RESISTANT Resistant <=0.5 SENSI... Sensitive CLINDAMYCIN <=0.25 SENS... Sensitive >=8 RESISTANT Resistant ERYTHROMYCIN >=8 RESISTANT Resistant >=8 RESISTANT Resistant GENTAMICIN <=0.5 SENSI... Sensitive <=0.5 SENSI... Sensitive Inducible Clindamycin  NEGATIVE Sensitive NEGATIVE Sensitive OXACILLIN >=4 RESISTANT Resistant 2 SENSITIVE Sensitive RIFAMPIN <=0.5 SENSI... Sensitive <=0.5 SENSI... Sensitive TETRACYCLINE <=1 SENSITIVE Sensitive <=1 SENSITIVE Sensitive TRIMETH/SULFA <=10 SENSIT Sensitive <=10 SENSIT Sensitive ... Marland Kitchen.. VANCOMYCIN 1 SENSITIVE Sensitive <=0.5 SENSI... Sensitive Right foot bone . Component 3 wk ago Specimen Description BONE Special Requests RIGHT 4 METATARSAL SAMPLE B Gram Stain NO WBC SEEN NO ORGANISMS SEEN Culture RARE METHICILLIN RESISTANT STAPHYLOCOCCUS AUREUS NO ANAEROBES ISOLATED Performed at [facility], [place] [address], [place], [place] [address] Report Status 10/08/2019 FINAL Organism ID, Bacteria METHICILLIN RESISTANT STAPHYLOCOCCUS AUREUS Resulting Agency [facility] Susceptibility Methicillin resistant staphylococcus aureus MIC CIPROFLOXACIN >=8 RESISTANT Resistant CLINDAMYCIN <=0.25 SENS... Sensitive ERYTHROMYCIN >=8 RESISTANT Resistant GENTAMICIN <=0.5 SENSI... Sensitive Inducible Clindamycin NEGATIVE Sensitive OXACILLIN >=4 RESISTANT Resistant RIFAMPIN <=0.5 SENSI... Sensitive TETRACYCLINE <=1 SENSITIVE Sensitive TRIMETH/SULFA <=10 SENSIT Sensitive ... VANCOMYCIN 1 SENSITIVE Sensitive This is a patient we had in clinic earlier this year with a wound over his left  fifth metatarsal head. 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 Doran Durand on 05/16/2019. He seems to have developed problems with wounds on his bilateral feet in June 2021 although he may have had problems earlier than this. He was in an urgent care with a right foot ulcer on 09/26/2019 and given a course of doxycycline. This was apparently after  having trouble getting into see orthopedics. He was seen by podiatry on 09/28/2019 noted to have bilateral lower extremity ulcers including the left lateral fifth metatarsal base and the right subfifth met head. It was noted that had purulent drainage at that time. He required hospitalization from 6/20 through 7/2. This was because of worsening right foot wounds. He underwent bilateral operative incision and drainage and bone biopsies bilaterally. Culture results are listed above. He has been referred back to clinic by Dr. Jacqualyn Posey of podiatry. He is also followed by Dr. Megan Salon who saw him yesterday. He was discharged from hospital on Zyvox Flagyl and Levaquin and yesterday changed to doxycycline Flagyl and Levaquin. His inflammatory markers on 6/26 showed a sedimentation rate of 129 and a C-reactive protein of 5. This is improved to 14 and 1.3 respectively. This would indicate improvement. ABIs in our clinic today were 1.23 on the right and 1.20 on the left 11/01/2019 on evaluation today patient appears to be doing fairly well in regard to the wounds on his feet at this point. Fortunately there is no signs of active infection at this time. No fevers, chills, nausea, vomiting, or diarrhea. He currently is seeing infectious disease and still under their care at this point. Subsequently he also has both wounds which she has not been using collagen on as he did not receive that in his packaging he did not call us and let us know that. Apparently that just was missed on the order. Nonetheless we will get that straightened out today. 8/9-Patient returns for bilateral foot wounds, using Prisma with hydrogel moistened dressings, and the wounds appear stable. Patient using surgical shoes, avoiding much pressure or weightbearing as much as possible 8/16; patient has bilateral foot wounds. 1 on the right lateral foot proximally the other is on the left mid lateral foot. Both required debridement of callus  and thick skin around the wounds. We have been using silver collagen 8/27; patient has bilateral lateral foot wounds. The area on the left substantially surrounded by callus and dry skin. This was removed from the wound edge. The underlying wound is small. The area on the right measured somewhat smaller today. 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 Unfortunately I did not update his antibiotics during today's visit. Electronic Signature(s) Signed: 12/01/2019 7:39:18 AM By: [name] MD Entered By: [name] on 12/01/2019 07:07:33 -------------------------------------------------------------------------------- Physical Exam Details Patient Name: Date of Service: [name], [name] 11/29/2019 3:45 PM Medical Record Number: [id number] Patient Account Number: [id number] Date of Birth/Sex: Treating RN: [birth year] (34 y.o. Marvis Repress Primary Care Provider: [name] Other Clinician: Referring Provider: Treating Provider/Extender: Elayne Snare Weeks in Treatment: 5 Constitutional Sitting or standing Blood Pressure is within target range for patient.. Pulse regular and within target range for patient.Marland Kitchen Respirations regular,  non-labored and within target range.. Temperature is normal and within the target range for the patient.Marland Kitchen Appears in no distress. Notes wound exam; left lateral foot wound in the right lateral foot wound. Considerable amount of callous dry skin around the area on the left. This required debridement with pickups and a #15 scalpel. The area on the right looks somewhat better there is still raised edges around the wound but no debridement Electronic Signature(s) Signed: 12/01/2019 7:39:18 AM By: [name] MD Entered By: [name] on 12/01/2019 07:08:56 -------------------------------------------------------------------------------- Physician Orders  Details Patient Name: Date of Service: [name], [name] 11/29/2019 3:45 PM Medical Record Number: [id number] Patient Account Number: [id number] Date of Birth/Sex: Treating RN: 10-Aug-1985 (34 y.o. 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 Dressing Change Frequency Wound #2 Right,Lateral Foot Change Dressing every other day. Wound #3 Left,Lateral Foot Change Dressing every other day. Skin Barriers/Peri-Wound Care Moisturizing lotion - both legs and feet Wound Cleansing May shower and wash wound with soap and water. - on days that dressing is changed Primary Wound Dressing Wound #2 Right,Lateral Foot Hydrofera Blue Wound #3 Left,Lateral Foot Hydrofera Blue Secondary Dressing Wound #2 Right,Lateral Foot Foam - foam donut Kerlix/Rolled Gauze Dry Gauze Wound #3 Left,Lateral Foot Foam - foam donut Kerlix/Rolled Gauze Dry Gauze Off-Loading Open toe surgical shoe to: - both feet Electronic Signature(s) Signed: 11/29/2019 5:56:33 PM By: Kela Millin Signed: 12/01/2019 7:39:18 AM By: [name] MD Entered By: Kela Millin on 11/29/2019 17:10:16 -------------------------------------------------------------------------------- Problem List  Details Patient Name: Date of Service: [name], [name] 11/29/2019 3:45 PM Medical Record Number: [id number] Patient Account Number: [id number] Date of Birth/Sex: Treating RN: 20-Feb-1986 (34 y.o. Marvis Repress Primary Care Provider: [name] Other Clinician: Referring Provider: Treating Provider/Extender: Delton See in Treatment: 5 Active Problems ICD-10 Encounter Code Description Active Date MDM Diagnosis E11.621 Type 2 diabetes mellitus with foot ulcer 10/24/2019 No Yes L97.518 Non-pressure chronic ulcer of other part of right foot with other specified 10/24/2019 No Yes severity L97.528 Non-pressure chronic ulcer of other part of left foot with other specified 10/24/2019 No Yes severity 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, right ankle and foot 10/24/2019 No Yes B95.62 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 10/24/2019 No Yes classified elsewhere Inactive Problems ICD-10 Code Description Active Date Inactive Date 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 He comes in for review of a wound over the plantar fifth metatarsal head extending into the lateral part of the foot. He was followed for this previously by his podiatrist Dr. Cornelius Moras. As the patient tells his story he went to see podiatry first for a swelling he developed on the lateral part of his fifth metatarsal head in May. He states this was "open" by podiatry and the area closed. He was followed up in June and it was again opened callus removed and it closed promptly. There were plans being made for surgery on the fifth metatarsal head in June however his blood sugar was apparently too high for anesthesia. Apparently the area was debrided and opened again in June and it is never closed since. Looking over the records from podiatry I am really not able to follow this. It was clear when he was first seen it was before 5/14 at that point he already had a wound. By 5/17 the ulcer was resolved. I do not see anything about a procedure. On 5/28 noted to have pre-ulcerative moderate keratosis. X-ray noted 1/5 contracted toe and tailor's bunion and metatarsal deformity. On a visit date on 09/28/2018 the dorsal part of the left foot it healed and resolved. There was concern about swelling in his lower extremity he was sent to the ER.. As far as I can tell he was seen in the ER on 7/12 with an ulcer on his left foot. A DVT rule out of the left leg was negative. I do not think I have complete records from podiatry but I am not able to verify the procedures this patient states he had. He states after the last procedure the wound has never closed although I am not able to follow this in the records I have from podiatry. He has not had a recent x-ray The patient has been using Neosporin on the wound. He is wearing a Darco shoe. He is still very active up on his foot working and exercising. Past medical history; type 2 diabetes ketosis-prone, leg swelling with a negative DVT  study in July. Non-smoker ABI in our clinic was 0.85 on the left 10/16; substantial wound on the plantar left fifth met head extending laterally almost to the dorsal fifth MTP. We have been using silver alginate we gave him a Darco forefoot off loader. An x-ray did not show evidence of osteomyelitis did note soft tissue emphysema which I think was due to gas tracking through an open wound. There is no doubt in my mind he requires an MRI 10/23; MRI not booked until 3 November at the earliest this is largely due to his glucose sensor in the right arm. We have been using silver alginate. There has been an improvement 10/29; I am still not exactly sure when his MRI is booked for. He says it is the third but it is the 10th in epic. This definitely needs to be done. He is running a low-grade fever today but no other symptoms. No real improvement in the 1 02/26/2019 patient presents today for a follow-up visit here in our clinic he is last been seen in the clinic on October 29. Subsequently we were working on getting MRI to evaluate and see what exactly was going on and where we would need to go from the standpoint of whether or not he had osteomyelitis  and again what treatments were going be required. Subsequently the patient ended up being admitted to the hospital on 02/07/2019 and was discharged on 02/14/2019. This is a somewhat interesting admission with a discharge diagnosis of pneumonia due to COVID-19 although he was positive for COVID-19 when tested at the urgent care but negative x2 when he was actually in the hospital. With that being said he did have acute respiratory failure with hypoxia and it was noted he also have a left foot ulceration with osteomyelitis. With that being said he did require oxygen for his pneumonia and I level 4 L. He was placed on antivirals and steroids for the COVID-19. He was also transferred to the [facility] at one point. Nonetheless he did subsequently  discharged home and since being home has done much better in that regard. The CT angiogram did not show any pulmonary embolism. With regard to the osteomyelitis the patient was placed on vancomycin and Zosyn while in the hospital but has been changed to Augmentin at discharge. It was also recommended that he follow- up with wound care and podiatry. Podiatry however wanted him to see [name] according to the patient prior to them doing anything further. His hemoglobin A1c was 9.9 as noted in the hospital. Have an MRI of the left foot performed while in the hospital on 02/04/2019. 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 There is an overlying plantar open wound noted an abscess tracking back along the lateral aspect of the fifth metatarsal shaft. There is otherwise diffuse cellulitis and mild fasciitis without findings of polymyositis. The patient did have recently pneumonia secondary to COVID-19 I looked in the chart through epic and it does appear that the patient may need to have an additional x-ray just to ensure everything is cleared and that he has no airspace disease prior to putting him into the [name]. 03/05/2019; patient was readmitted to the clinic last week. He was hospitalized twice for a viral upper respiratory tract infection from 11/1 through 11/4 and then 11/5 through 11/12 ultimately this turned out to be Covid pneumonitis. Although he was discharged on oxygen he is not using it. He says he feels fine. He has no exercise limitation no cough no sputum. His O2 sat in our clinic today was 100% on room air. 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 He received Vanco and Zosyn in the hospital and then was discharged on 2 weeks of Augmentin. I do not see any relevant cultures. He was  supposed to follow-up with infectious disease but I do not see that he has an appointment. 12/8; patient saw Dr. Novella Olive of infectious disease last week. He felt that he had had adequate antibiotic therapy. He did not go to follow-up with Dr. Amalia Hailey of podiatry and I have again talked to him about the pros and cons of this. He does not want to consider a ray amputation of this time. He is aware of the risks of recurrence, migration etc. He started HBO today and tolerated this well. He can complete the Augmentin that I gave him last week. I have looked over the lab work that Dr. Chana Bode ordered his C-reactive protein was 3.3 and his sedimentation rate was 17. The C-reactive protein is never really been measurably that high in this patient 12/15; not  much change in the wound today however he has undermining along the lateral part of the foot again more extensively than last week. He has some rims of epithelialization. We have been using silver alginate. He is undergoing hyperbarics but did not dive today 12/18; in for his obligatory first total contact cast change. Unfortunately there was pus coming from the undermining area around his fifth metatarsal head. This was cultured but will preclude reapplication of a cast. He is seen in conjunction with HBO 12/24; patient had staph lugdunensis in the wound in the undermining area laterally last time. We put him on doxycycline which should have covered this. The wound looks better today. I am going to give him another week of doxycycline before reattempting the total contact cast 12/31; the patient is completing antibiotics. Hemorrhagic debris in the distal part of the wound with some undermining distally. He also had hyper granulation. Extensive debridement with a #5 curette. The infected area that was on the lateral part of the fifth met head is closed over. I do not think he needs any more antibiotics. Patient was seen prior to HBO. Preparations for a total  contact cast were made in the cast will be placed post hyperbarics 04/11/19; once again the patient arrives today without complaint. He had been in a cast all week noted that he had heavy drainage this week. This resulted in large raised areas of macerated tissue around the wound 1/14; wound bed looks better slightly smaller. Hydrofera Blue has been changing himself. He had a heavy drainage last week which caused a lot of maceration around the wound so I took him out of a total contact cast he says the drainage is actually better this week He is seen today in conjunction with HBO 1/21; returns to clinic. He was up in [place] for a day or 2 attending a funeral. He comes back in with the wound larger and with a large area of exposed bone. He had osteomyelitis and septic arthritis of the fifth left metatarsal head while he was in hospital. He received IV antibiotics in the hospital for a prolonged period of time then 3 weeks of Augmentin. Subsequently I gave him 2 weeks of doxycycline for more superficial wound infection. When I saw this last week the wound was smaller the surface of the wound looks satisfactory. 1/28; patient missed hyperbarics today. Bone biopsy I did last time showed Enterococcus faecalis and Staphylococcus lugdunensis . He has a wide area of exposed bone. We are going to use silver alginate as of today. I had another ethical discussion with the patient. 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 In this situation I think the likelihood of healing this is low. Therefore I have recommended a ray amputation and with the patient's agreement I have referred him to Dr. Doran Durand. The other issue is that his compliance with hyperbarics has been minimal because of his work schedule and given his underlying decision I am going to stop this today READMISSION 10/24/2019 MRI 09/29/2019 left foot IMPRESSION: 1. Apparent skin ulceration inferior and lateral to the  5th metatarsal base with underlying heterogeneous T2 signal and enhancement in the subcutaneous fat. Small peripherally enhancing fluid collections along the plantar and lateral aspects of the 5th metatarsal base suspicious for abscesses. 2. Interval amputation through the mid 5th metatarsal with nonspecific low-level marrow edema and enhancement. Given the proximity to the adjacent soft tissue inflammatory changes, osteomyelitis cannot be excluded. 3. The additional bones appear  unremarkable. MRI 09/29/2019 right foot IMPRESSION: 1. Soft tissue ulceration lateral to the 5th MTP joint. There is low-level T2 hyperintensity within the 4th and 5th metatarsal heads and adjacent proximal phalanges without abnormal T1 signal or cortical destruction. These findings are nonspecific and could be seen with early marrow edema, hyperemia or early osteomyelitis. No evidence of septic joint. 2. Mild tenosynovitis and synovial enhancement associated with the extensor digitorum tendons at the level of the midfoot. 3. Diffuse low-level muscular T2 hyperintensity and enhancement, most consistent with diabetic myopathy. LEFT FOOT BONE Methicillin resistant staphylococcus aureus Staphylococcus lugdunensis MIC MIC CIPROFLOXACIN >=8 RESISTANT Resistant <=0.5 SENSI... Sensitive CLINDAMYCIN <=0.25 SENS... Sensitive >=8 RESISTANT Resistant ERYTHROMYCIN >=8 RESISTANT Resistant >=8 RESISTANT Resistant GENTAMICIN <=0.5 SENSI... Sensitive <=0.5 SENSI... Sensitive Inducible Clindamycin NEGATIVE Sensitive NEGATIVE Sensitive OXACILLIN >=4 RESISTANT Resistant 2 SENSITIVE Sensitive RIFAMPIN <=0.5 SENSI... Sensitive <=0.5 SENSI... Sensitive TETRACYCLINE <=1 SENSITIVE Sensitive <=1 SENSITIVE Sensitive TRIMETH/SULFA <=10 SENSIT Sensitive <=10 SENSIT Sensitive ... Marland Kitchen.. VANCOMYCIN 1 SENSITIVE Sensitive <=0.5 SENSI... Sensitive Right foot bone . Component 3 wk ago Specimen Description BONE Special Requests RIGHT 4  METATARSAL SAMPLE B Gram Stain NO WBC SEEN NO ORGANISMS SEEN Culture RARE METHICILLIN RESISTANT STAPHYLOCOCCUS AUREUS NO ANAEROBES ISOLATED Performed at [facility], [place] [address] [place], [place] [address] Report Status 10/08/2019 FINAL Organism ID, Bacteria METHICILLIN RESISTANT STAPHYLOCOCCUS AUREUS Resulting Agency [facility] Susceptibility Methicillin resistant staphylococcus aureus MIC CIPROFLOXACIN >=8 RESISTANT Resistant CLINDAMYCIN <=0.25 SENS... Sensitive ERYTHROMYCIN >=8 RESISTANT Resistant GENTAMICIN <=0.5 SENSI... Sensitive Inducible Clindamycin NEGATIVE Sensitive OXACILLIN >=4 RESISTANT Resistant RIFAMPIN <=0.5 SENSI... Sensitive TETRACYCLINE <=1 SENSITIVE Sensitive TRIMETH/SULFA <=10 SENSIT Sensitive ... VANCOMYCIN 1 SENSITIVE Sensitive This is a patient we had in clinic earlier this year with a wound over his left fifth metatarsal head. 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 Doran Durand on 05/16/2019. He seems to have developed problems with wounds on his bilateral feet in June 2021 although he may have had problems earlier than this. He was in an urgent care with a right foot ulcer on 09/26/2019 and given a course of doxycycline. This was apparently after having trouble getting into see orthopedics. He was seen by podiatry on 09/28/2019 noted to have bilateral lower extremity ulcers including the left lateral fifth metatarsal base and the right subfifth met head. It was noted that had purulent drainage at that time. He required hospitalization from 6/20 through 7/2. This was because of worsening right foot wounds. He underwent bilateral operative incision and drainage and bone biopsies  bilaterally. Culture results are listed above. He has been referred back to clinic by Dr. Jacqualyn Posey of podiatry. He is also followed by Dr. Megan Salon who saw him yesterday. He was discharged from hospital on Zyvox Flagyl and Levaquin and yesterday changed to doxycycline Flagyl and Levaquin. His inflammatory markers on 6/26 showed a sedimentation rate of 129 and a C-reactive protein of 5. This is improved to 14 and 1.3 respectively. This would indicate improvement. ABIs in our clinic today were 1.23 on the right and 1.20 on the left 11/01/2019 on evaluation today patient appears to be doing fairly well in regard to the wounds on his feet at this point. Fortunately there is no signs of active infection at this time. No  fevers, chills, nausea, vomiting, or diarrhea. He currently is seeing infectious disease and still under their care at this point. Subsequently he also has both wounds which she has not been using collagen on as he did not receive that in his packaging he did not call us and let us know that. Apparently that just was missed on the order. Nonetheless we will get that straightened out today. 8/9-Patient returns for bilateral foot wounds, using Prisma with hydrogel moistened dressings, and the wounds appear stable. Patient using surgical shoes, avoiding much pressure or weightbearing as much as possible 8/16; patient has bilateral foot wounds. 1 on the right lateral foot proximally the other is on the left mid lateral foot. Both required debridement of callus and thick skin around the wounds. We have been using silver collagen 8/27; patient has bilateral lateral foot wounds. The area on the left substantially surrounded by callus and dry skin. This was removed from the wound edge. The underlying wound is small. The area on the right measured somewhat smaller today. 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 Unfortunately I did  not update his antibiotics during today's visit. Objective Constitutional Sitting or standing Blood Pressure is within target range for patient.. Pulse regular and within target range for patient.Marland Kitchen Respirations regular, non-labored and within target range.. Temperature is normal and within the target range for the patient.Marland Kitchen Appears in no distress. Vitals Time Taken: 4:20 PM, Height: 77 in, Weight: 280 lbs, BMI: 33.2, Temperature: 98.0 F, Pulse: 91 bpm, Respiratory Rate: 17 breaths/min, Blood Pressure: 118/76 mmHg. General Notes: wound exam; left lateral foot wound in the right lateral foot wound. Considerable amount of callous dry skin around the area on the left. This required debridement with pickups and a #15 scalpel. ooThe area on the right looks somewhat better there is still raised edges around the wound but no debridement Integumentary (Hair, Skin) Wound #2 status is Open. Original cause of wound was Gradually Appeared. The wound is located on the Right,Lateral Foot. The wound measures 1.4cm length x 1.2cm width x 0.1cm depth; 1.319cm^2 area and 0.132cm^3 volume. There is Fat Layer (Subcutaneous Tissue) exposed. There is no tunneling or undermining noted. There is a medium amount of serosanguineous drainage noted. The wound margin is flat and intact. There is large (67-100%) red granulation within the wound bed. There is a small (1-33%) amount of necrotic tissue within the wound bed including Adherent Slough. Wound #3 status is Open. Original cause of wound was Trauma. The wound is located on the Left,Lateral Foot. The wound measures 0.6cm length x 0.6cm width x 0.1cm depth; 0.283cm^2 area and 0.028cm^3 volume. There is Fat Layer (Subcutaneous Tissue) exposed. There is no tunneling or undermining noted. There is a medium amount of serosanguineous drainage noted. The wound margin is flat and intact. There is large (67-100%) red granulation within the wound bed. There is no necrotic tissue  within the wound bed. 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 There was a Selective/Open  Wound Skin/Dermis Debridement with a total area of 1 sq cm performed by [name], MD. With the following instrument(s): Curette Material removed includes Callus and Skin: Dermis and after achieving pain control using Other (benzocaine, 20%). No specimens were taken. A time out was conducted at 17:10, prior to the start of the procedure. A Minimum amount of bleeding was controlled with Pressure. The procedure was tolerated well with a pain level of 0 throughout and a pain level of 0 following the procedure. Post Debridement Measurements: 0.6cm length x 0.6cm width x 0.1cm depth; 0.028cm^3 volume. Character of Wound/Ulcer Post Debridement is improved. Severity of Tissue Post Debridement is: Fat layer exposed. Post procedure Diagnosis Wound #3: Same as Pre-Procedure Plan Follow-up Appointments: Return Appointment in 2 weeks. Dressing Change Frequency: Wound #2 Right,Lateral Foot: Change Dressing every other day. Wound #3 Left,Lateral Foot: Change Dressing every other day. Skin Barriers/Peri-Wound Care: Moisturizing lotion - both legs and feet Wound Cleansing: May shower and wash wound with soap and water. - on days that dressing is changed Primary Wound Dressing: Wound #2 Right,Lateral Foot: Hydrofera Blue Wound #3 Left,Lateral Foot: Hydrofera  Blue Secondary Dressing: Wound #2 Right,Lateral Foot: Foam - foam donut Kerlix/Rolled Gauze Dry Gauze Wound #3 Left,Lateral Foot: Foam - foam donut Kerlix/Rolled Gauze Dry Gauze Off-Loading: Open toe surgical shoe to: - both feet #1 change the primary dressing the Hydrofera Blue bilaterally #2 I will need to update where we are with infectious disease with regards to his underlying osteomyelitis left foot. There was no evidence of infection today however at least clinically in the wound or soft tissues Electronic Signature(s) Signed: 12/01/2019 7:39:18 AM By: [name] MD Entered By: [name] on 12/01/2019 07:09:52 -------------------------------------------------------------------------------- SuperBill Details Patient Name: Date of Service: [name], [name] 11/29/2019 Medical Record Number: [id number] Patient Account Number: [id number] Date of Birth/Sex: Treating RN: 28-Mar-1986 (34 y.o. Marvis Repress Primary Care Provider: [name] Other Clinician: Referring Provider: Treating Provider/Extender: Elayne Snare Weeks in Treatment: 5 Diagnosis Coding ICD-10 Codes Code Description [phone] Type 2 diabetes mellitus with foot ulcer L97.518 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ity L97.528 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ity 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, right ankle and foot 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, left ankle and foot B95.62 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 classified elsewhere Facility Procedures CPT4 Code: [id number] Description: [phone] - DEBRIDE WOUND 1ST 20 SQ CM OR < ICD-10 Diagnosis Description L97.528 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it Modifier: y Quantity: 1 Physician Procedures : CPT4 Code Description Modifier  [id number] - WC PHYS DEBR WO ANESTH 20 SQ CM ICD-10 Diagnosis Description L97.528 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ity Quantity: 1 Electronic Signature(s) Signed: 12/01/2019 7:39:18 AM By: [name] MD Previous Signature: 11/29/2019 5:56:33 PM Version By: Kela Millin Entered By: [name] on 12/01/2019 07:10:21

## 2019-12-04 ENCOUNTER — Telehealth: Payer: Self-pay | Admitting: Nutrition

## 2019-12-04 NOTE — Telephone Encounter (Signed)
LVM to call to schedule appointment to learn about insulin pumps.  Second message left

## 2019-12-04 NOTE — Progress Notes (Signed)
Mausolf, [name] ([id number]) Visit Report for 11/29/2019 Arrival Information Details Patient Name: Date of Service: [name] 11/29/2019 3:45 PM Medical Record Number: [id number] Patient Account Number: [id number] Date of Birth/Sex: Treating RN: 27-Sep-1985 (34 y.o. [name] Primary Care Reuven Braver: Katy Apo Other Clinician: Referring Tehilla Coffel: Treating Rodgerick Gilliand/Extender: Demetria Pore in Treatment: 5 Visit Information History Since Last Visit Added or deleted any medications: No Patient Arrived: Ambulatory Any new allergies or adverse reactions: No Arrival Time: 16:18 Had a fall or experienced change in No Accompanied By: self activities of daily living that may affect Transfer Assistance: None risk of falls: Patient Identification Verified: Yes Signs or symptoms of abuse/neglect since last visito No Secondary Verification Process Completed: Yes Hospitalized since last visit: No Patient Requires Transmission-Based Precautions: No Implantable device outside of the clinic excluding No Patient Has Alerts: No cellular tissue based products placed in the center since last visit: Has Dressing in Place as Prescribed: Yes Pain Present Now: No Electronic Signature(s) Signed: 12/04/2019 11:00:36 AM By: Karl Ito Entered By: Karl Ito on 11/29/2019 16:20:23 -------------------------------------------------------------------------------- Encounter Discharge Information Details Patient Name: Date of Service: [name], [name] 11/29/2019 3:45 PM Medical Record Number: [id number] Patient Account Number: [id number] Date of Birth/Sex: Treating RN: [birth year] (34 y.o. [name] Primary Care Hideko Esselman: Katy Apo Other Clinician: Referring Danh Bayus: Treating Raymon Schlarb/Extender: Demetria Pore in Treatment: 5 Encounter Discharge Information Items Post Procedure Vitals Discharge Condition:  Stable Temperature (F): 98.0 Ambulatory Status: Ambulatory Pulse (bpm): 91 Discharge Destination: Home Respiratory Rate (breaths/min): 17 Transportation: Private Auto Blood Pressure (mmHg): 118/76 Accompanied By: alone Schedule Follow-up Appointment: Yes Clinical Summary of Care: Patient Declined Electronic Signature(s) Signed: 11/29/2019 6:01:16 PM By: Zandra Abts RN, BSN Entered By: Zandra Abts on 11/29/2019 18:01:00 -------------------------------------------------------------------------------- Lower Extremity Assessment Details Patient Name: Date of Service: [name], [name] 11/29/2019 3:45 PM Medical Record Number: [id number] Patient Account Number: [id number] Date of Birth/Sex: Treating RN: April 30, 1985 (34 y.o. [name] Primary Care Jermone Geister: Katy Apo Other Clinician: Referring [name]: Treating Xcaret Morad/Extender: Myna Bright Weeks in Treatment: 5 Edema Assessment Assessed: [Left: No] [[name]: No] Edema: [Left: No] [[name]: No] Calf Left: [name]: Point of Measurement: 48 cm From Medial Instep 46 cm 46 cm Ankle Left: [name]: Point of Measurement: 10 cm From Medial Instep 26 cm 28 cm Electronic Signature(s) Signed: 11/29/2019 5:49:17 PM By: Yevonne Pax RN Entered By: Yevonne Pax on 11/29/2019 16:23:13 -------------------------------------------------------------------------------- Multi Wound Chart Details Patient Name: Date of Service: [name], [name] 11/29/2019 3:45 PM Medical Record Number: [id number] Patient Account Number: [id number] Date of Birth/Sex: Treating RN: [birth year] (34 y.o. [name] Primary Care [name]: Katy Apo Other Clinician: Referring Erron Wengert: Treating Thy Gullikson/Extender: Demetria Pore in Treatment: 5 Vital Signs Height(in): 77 Pulse(bpm): 91 Weight(lbs): 280 Blood Pressure(mmHg): 118/76 Body Mass Index(BMI): 33 Temperature(F): 98.0 Respiratory  Rate(breaths/min): 17 Photos: [2:No Photos [name], Lateral Foot] [3:No Photos Left, Lateral Foot] [N/A:N/A N/A] Wound Location: [2:Gradually Appeared] [3:Trauma] [N/A:N/A] Wounding Event: [2:Diabetic Wound/Ulcer of the Lower] [3:Diabetic Wound/Ulcer of the Lower] [N/A:N/A] Primary Etiology: [2:Extremity Type II Diabetes] [3:Extremity Type II Diabetes] [N/A:N/A] Comorbid History: [2:09/03/2019] [3:10/02/2019] [N/A:N/A] Date Acquired: [2:5] [3:5] [N/A:N/A] Weeks of Treatment: [2:Open] [3:Open] [N/A:N/A] Wound Status: [2:1.4x1.2x0.1] [3:0.6x0.6x0.1] [N/A:N/A] Measurements L x W x D (cm) [2:1.319] [3:0.283] [N/A:N/A] A (cm) : rea [2:0.132] [3:0.028] [N/A:N/A] Volume (cm) : [2:76.10%] [3:82.80%] [N/A:N/A] % Reduction in A  rea: [2:76.00%] [3:83.00%] [N/A:N/A] % Reduction in Volume: [2:Grade 2] [3:Grade 2] [N/A:N/A] Classification: [2:Medium] [3:Medium] [N/A:N/A] Exudate A mount: [2:Serosanguineous] [3:Serosanguineous] [N/A:N/A] Exudate Type: [2:red, brown] [3:red, brown] [N/A:N/A] Exudate Color: [2:Flat and Intact] [3:Flat and Intact] [N/A:N/A] Wound Margin: [2:Large (67-100%)] [3:Large (67-100%)] [N/A:N/A] Granulation Amount: [2:Red] [3:Red] [N/A:N/A] Granulation Quality: [2:Small (1-33%)] [3:None Present (0%)] [N/A:N/A] Necrotic Amount: [2:Fat Layer (Subcutaneous Tissue): Yes Fat Layer (Subcutaneous Tissue): Yes N/A] Exposed Structures: [2:Fascia: No Tendon: No Muscle: No Joint: No Bone: No Small (1-33%)] [3:Fascia: No Tendon: No Muscle: No Joint: No Bone: No Small (1-33%)] [N/A:N/A] Epithelialization: [2:N/A] [3:Debridement - Selective/Open Wound N/A] Debridement: Pre-procedure Verification/Time Out N/A [3:17:10] [N/A:N/A] Taken: [2:N/A] [3:Other] [N/A:N/A] Pain Control: [2:N/A] [3:Callus] [N/A:N/A] Tissue Debrided: [2:N/A] [3:Skin/Dermis] [N/A:N/A] Level: [2:N/A] [3:1] [N/A:N/A] Debridement A (sq cm): [2:rea N/A] [3:Curette] [N/A:N/A] Instrument: [2:N/A] [3:Minimum] [N/A:N/A] Bleeding:  [2:N/A] [3:Pressure] [N/A:N/A] Hemostasis A chieved: [2:N/A] [3:0] [N/A:N/A] Procedural Pain: [2:N/A] [3:0] [N/A:N/A] Post Procedural Pain: [2:N/A] [3:Procedure was tolerated well] [N/A:N/A] Debridement Treatment Response: [2:N/A] [3:0.6x0.6x0.1] [N/A:N/A] Post Debridement Measurements L x W x D (cm) [2:N/A] [3:0.028] [N/A:N/A] Post Debridement Volume: (cm) [2:N/A] [3:Debridement] [N/A:N/A] Treatment Notes Wound #2 ([name], Lateral Foot) 1. Cleanse With Wound Cleanser 3. Primary Dressing Applied Hydrofera Blue 4. Secondary Dressing Dry Gauze Roll Gauze Foam 5. Secured With Tape Wound #3 (Left, Lateral Foot) 1. Cleanse With Wound Cleanser 3. Primary Dressing Applied Hydrofera Blue 4. Secondary Dressing Dry Gauze Roll Gauze Foam 5. Secured With Secretary/administrator) Signed: 12/01/2019 7:39:18 AM By: Baltazar Najjar MD Signed: 12/02/2019 4:26:23 PM By: Cherylin Mylar Entered By: Baltazar Najjar on 12/01/2019 07:02:57 -------------------------------------------------------------------------------- Multi-Disciplinary Care Plan Details Patient Name: Date of Service: [name], [name] 11/29/2019 3:45 PM Medical Record Number: [id number] Patient Account Number: [id number] Date of Birth/Sex: Treating RN: 08-13-1985 (34 y.o. [name] Primary Care Jiovani Mccammon: Katy Apo Other Clinician: Referring Marquon Alcala: Treating Keatin Benham/Extender: Demetria Pore in Treatment: 5 Active Inactive Nutrition Nursing Diagnoses: Imbalanced nutrition Potential for alteratiion in Nutrition/Potential for imbalanced nutrition Goals: Patient/caregiver agrees to and verbalizes understanding of need to use nutritional supplements and/or vitamins as prescribed Date Initiated: 10/24/2019 Target Resolution Date: 12/20/2019 Goal Status: Active Patient/caregiver will maintain therapeutic glucose control Date Initiated: 10/24/2019 Target Resolution Date:  12/20/2019 Goal Status: Active Interventions: Assess HgA1c results as ordered upon admission and as needed Assess patient nutrition upon admission and as needed per policy Provide education on elevated blood sugars and impact on wound healing Provide education on nutrition Treatment Activities: Education provided on Nutrition : 11/01/2019 Notes: Wound/Skin Impairment Nursing Diagnoses: Impaired tissue integrity Knowledge deficit related to ulceration/compromised skin integrity Goals: Patient/caregiver will verbalize understanding of skin care regimen Date Initiated: 10/24/2019 Target Resolution Date: 11/29/2019 Goal Status: Active Ulcer/skin breakdown will have a volume reduction of 30% by week 4 Date Initiated: 10/24/2019 Target Resolution Date: 11/29/2019 Goal Status: Active Interventions: Assess patient/caregiver ability to obtain necessary supplies Assess patient/caregiver ability to perform ulcer/skin care regimen upon admission and as needed Assess ulceration(s) every visit Provide education on ulcer and skin care Notes: Electronic Signature(s) Signed: 11/29/2019 5:56:33 PM By: Cherylin Mylar Entered By: Cherylin Mylar on 11/29/2019 17:10:28 -------------------------------------------------------------------------------- Pain Assessment Details Patient Name: Date of Service: [name] D 11/29/2019 3:45 PM Medical Record Number: [id number] Patient Account Number: [id number] Date of Birth/Sex: Treating RN: 01/26/1986 (34 y.o. [name] Primary Care Shanae Luo: Katy Apo Other Clinician: Referring Vicki Pasqual: Treating Trenna Kiely/Extender: Demetria Pore in Treatment: 5 Active Problems  Location of Pain Severity and Description of Pain Patient Has Paino No Site Locations Pain Management and Medication Current Pain Management: Electronic Signature(s) Signed: 11/29/2019 5:56:33 PM By: Cherylin Mylar Signed: 12/04/2019  11:00:36 AM By: Karl Ito Entered By: Karl Ito on 11/29/2019 16:20:47 -------------------------------------------------------------------------------- Patient/Caregiver Education Details Patient Name: Date of Service: [name] D 8/27/2021andnbsp3:45 PM Medical Record Number: [id number] Patient Account Number: [id number] Date of Birth/Gender: Treating RN: 18-Nov-1985 (34 y.o. [name] Primary Care Physician: Katy Apo Other Clinician: Referring Physician: Treating Physician/Extender: Demetria Pore in Treatment: 5 Education Assessment Education Provided To: Patient Education Topics Provided Elevated Blood Sugar/ Impact on Healing: Wound/Skin Impairment: Handouts: Caring for Your Ulcer Methods: Explain/Verbal Responses: State content correctly Electronic Signature(s) Signed: 11/29/2019 5:56:33 PM By: Cherylin Mylar Entered By: Cherylin Mylar on 11/29/2019 17:11:01 -------------------------------------------------------------------------------- Wound Assessment Details Patient Name: Date of Service: [name] D 11/29/2019 3:45 PM Medical Record Number: [id number] Patient Account Number: [id number] Date of Birth/Sex: Treating RN: May 22, 1985 (34 y.o. [name]) Yevonne Pax Primary Care Niki Payment: Katy Apo Other Clinician: Referring Danzig Macgregor: Treating Julyana Woolverton/Extender: Myna Bright Weeks in Treatment: 5 Wound Status Wound Number: 2 Primary Etiology: Diabetic Wound/Ulcer of the Lower Extremity Wound Location: [name], Lateral Foot Wound Status: Open Wounding Event: Gradually Appeared Comorbid History: Type II Diabetes Date Acquired: 09/03/2019 Weeks Of Treatment: 5 Clustered Wound: No Photos Wound Measurements Length: (cm) 1.4 % Re Width: (cm) 1.2 % Re Depth: (cm) 0.1 Epit Area: (cm) 1.319 Tun Volume: (cm) 0.132 Und duction in Area: 76.1% duction in Volume:  76% helialization: Small (1-33%) neling: No ermining: No Wound Description Classification: Grade 2 Fou Wound Margin: Flat and Intact Slo Exudate Amount: Medium Exudate Type: Serosanguineous Exudate Color: red, brown l Odor After Cleansing: No ugh/Fibrino Yes Wound Bed Granulation Amount: Large (67-100%) Exposed Structure Granulation Quality: Red Fascia Exposed: No Necrotic Amount: Small (1-33%) Fat Layer (Subcutaneous Tissue) Exposed: Yes Necrotic Quality: Adherent Slough Tendon Exposed: No Muscle Exposed: No Joint Exposed: No Bone Exposed: No Treatment Notes Wound #2 ([name], Lateral Foot) 1. Cleanse With Wound Cleanser 3. Primary Dressing Applied Hydrofera Blue 4. Secondary Dressing Dry Gauze Roll Gauze Foam 5. Secured With Secretary/administrator) Signed: 12/03/2019 4:00:17 PM By: [name] EMT/HBOT/SD Signed: 12/04/2019 5:45:10 PM By: Yevonne Pax RN Previous Signature: 11/29/2019 5:49:17 PM Version By: Yevonne Pax RN Entered By: [name] on 12/03/2019 11:22:57 -------------------------------------------------------------------------------- Wound Assessment Details Patient Name: Date of Service: [name], [name] 11/29/2019 3:45 PM Medical Record Number: [id number] Patient Account Number: [id number] Date of Birth/Sex: Treating RN: 1985/12/30 (34 y.o. [name]) Yevonne Pax Primary Care Rhen Kawecki: Katy Apo Other Clinician: Referring Tyse Auriemma: Treating Altair Stanko/Extender: Demetria Pore in Treatment: 5 Wound Status Wound Number: 3 Primary Etiology: Diabetic Wound/Ulcer of the Lower Extremity Wound Location: Left, Lateral Foot Wound Status: Open Wounding Event: Trauma Comorbid History: Type II Diabetes Date Acquired: 10/02/2019 Weeks Of Treatment: 5 Clustered Wound: No Photos Wound Measurements Length: (cm) 0.6 Width: (cm) 0.6 Depth: (cm) 0.1 Area: (cm) 0.283 Volume: (cm) 0.028 % Reduction in Area: 82.8% %  Reduction in Volume: 83% Epithelialization: Small (1-33%) Tunneling: No Undermining: No Wound Description Classification: Grade 2 Wound Margin: Flat and Intact Exudate Amount: Medium Exudate Type: Serosanguineous Exudate Color: red, brown Foul Odor After Cleansing: No Slough/Fibrino Yes Wound Bed Granulation Amount: Large (67-100%) Exposed Structure Granulation Quality: Red Fascia Exposed: No Necrotic Amount: None Present (0%) Fat Layer (Subcutaneous Tissue) Exposed:  Yes Tendon Exposed: No Muscle Exposed: No Joint Exposed: No Bone Exposed: No Treatment Notes Wound #3 (Left, Lateral Foot) 1. Cleanse With Wound Cleanser 3. Primary Dressing Applied Hydrofera Blue 4. Secondary Dressing Dry Gauze Roll Gauze Foam 5. Secured With Secretary/administratorTape Electronic Signature(s) Signed: 12/03/2019 4:00:17 PM By: Benjaman KindlerJones, Dedrick EMT/HBOT/SD Signed: 12/04/2019 5:45:10 PM By: Yevonne PaxEpps, Carrie RN Previous Signature: 11/29/2019 5:49:17 PM Version By: Yevonne PaxEpps, Carrie RN Entered By: Benjaman KindlerJones, Dedrick on 12/03/2019 11:23:17 -------------------------------------------------------------------------------- Vitals Details Patient Name: Date of Service: Max [name], [name] 11/29/2019 3:45 PM Medical Record Number: [id number] Patient Account Number: [id number] Date of Birth/Sex: Treating RN: 1985-08-03 (34 y.o. Max RightM) Dwiggins, Shannon Primary Care Reyne Falconi: Katy ApoPolite, Ronald D Other Clinician: Referring Heather Streeper: Treating Dujuan Stankowski/Extender: Demetria Poreobson, Michael [name], Ronald D Weeks in Treatment: 5 Vital Signs Time Taken: 16:20 Temperature (F): 98.0 Height (in): 77 Pulse (bpm): 91 Weight (lbs): 280 Respiratory Rate (breaths/min): 17 Body Mass Index (BMI): 33.2 Blood Pressure (mmHg): 118/76 Reference Range: 80 - 120 mg / dl Electronic Signature(s) Signed: 12/04/2019 11:00:36 AM By: Karl Itoawkins, Destiny Entered By: Karl Itoawkins, Destiny on 11/29/2019 16:20:40

## 2019-12-12 ENCOUNTER — Encounter: Payer: Self-pay | Admitting: Gastroenterology

## 2019-12-12 ENCOUNTER — Ambulatory Visit (INDEPENDENT_AMBULATORY_CARE_PROVIDER_SITE_OTHER): Payer: BC Managed Care – PPO | Admitting: Gastroenterology

## 2019-12-12 VITALS — BP 140/84 | HR 97 | Ht 77.0 in | Wt 312.1 lb

## 2019-12-12 DIAGNOSIS — R194 Change in bowel habit: Secondary | ICD-10-CM | POA: Diagnosis not present

## 2019-12-12 DIAGNOSIS — D649 Anemia, unspecified: Secondary | ICD-10-CM | POA: Diagnosis not present

## 2019-12-12 NOTE — Patient Instructions (Signed)
If you are age 33 or older, your body mass index should be between 23-30. Your Body mass index is 37.01 kg/m. If this is out of the aforementioned range listed, please consider follow up with your Primary Care Provider.  If you are age 33 or younger, your body mass index should be between 19-25. Your Body mass index is 37.01 kg/m. If this is out of the aformentioned range listed, please consider follow up with your Primary Care Provider.   Please go to the lab in the basement of our building to have lab work done as you leave today. Hit "B" for basement when you get on the elevator.  When the doors open the lab is on your left.  We will call you with the results. Thank you.  Due to recent changes in healthcare laws, you may see the results of your imaging and laboratory studies on MyChart before your provider has had a chance to review them.  We understand that in some cases there may be results that are confusing or concerning to you. Not all laboratory results come back in the same time frame and the provider may be waiting for multiple results in order to interpret others.  Please give [name] 48 hours in order for your provider to thoroughly review all the results before contacting the office for clarification of your results.   Thank you for entrusting me with your care and for choosing [facility], Dr. Ileene Patrick

## 2019-12-12 NOTE — Progress Notes (Signed)
HPI :  34 year old male with a history of type 1 diabetes and osteomyelitis, history of Covid in 2020, here for a follow-up visit for altered bowel habits and anemia.  He was seen by Alcide Evener in July 2021 for his intake history.  At that point time he endorsed having chronic loose stools for the past 2 years.  He attributed this to multiple trials of diabetes medications which he did not tolerate.  He states whenever he takes antibiotics his symptoms resolve.  He has been on antibiotics for several weeks now for osteomyelitis in his foot, and he has not had any problems with his bowels.  He is having solid formed stools and feels well without complaints.  Denies any blood in his stools.  He remains on doxycycline for his osteomyelitis.  No abdominal pains.  No family history of colon cancer.  Previous trials of Trulicity, Metformin, Bisgard, were not tolerated well.  He is now on mostly insulin to treat his diabetes.  He denies any NSAID use.  Otherwise has been noted to have a anemia since his hospitalization for osteomyelitis.  Hemoglobin has ranged in the 11's and is normocytic, MCV 86.  We had recommended repeating his CBC with lab evaluation for anemia but he has not done so yet.  He otherwise denies any complaints and feels well today.     Past Medical History:  Diagnosis Date  . Diabetes mellitus   . Osteomyelitis [facility])      Past Surgical History:  Procedure Laterality Date  . 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 NO PAST SURGERIES    . WOUND DEBRIDEMENT Bilateral 10/02/2019   Procedure: DEBRIDEMENT WOUNDS BOTH LOWER EXTREMITIES WITH BONE BIOPSIES;  Surgeon: Vivi Barrack, DPM;  Location: [facility] OR;  Service: Podiatry;  Laterality: Bilateral;   Family History  Problem Relation Age of Onset  . Hypertension Other   . Diabetes Other    . Healthy Mother   . Colon cancer Neg Hx   . Stomach cancer Neg Hx   . Pancreatic cancer Neg Hx   . Esophageal cancer Neg Hx    Social History   Tobacco Use  . Smoking status: Never Smoker  . Smokeless tobacco: Never Used  Vaping Use  . Vaping Use: Never used  Substance Use Topics  . Alcohol use: No  . Drug use: No   Current Outpatient Medications  Medication Sig Dispense Refill  . Continuous Blood Gluc Sensor (FREESTYLE LIBRE 14 DAY SENSOR) MISC Inject 1 patch into the skin every 14 (fourteen) days.    . Continuous Blood Gluc Sensor MISC 1 each by Does not apply route as directed. Use as directed every 14 days. May dispense FreeStyle [employer] or similar. 2 each 0  . insulin aspart (NOVOLOG) 100 UNIT/ML injection Inject 0-15 Units into the skin 3 (three) times daily with meals. (Patient taking differently: Inject 7-12 Units into the skin 3 (three) times daily as needed (before meals, if BGL is 250 or greater). ) 10 mL 1  . insulin glargine (LANTUS SOLOSTAR) 100 UNIT/ML Solostar Pen Inject 40 Units into the skin daily before breakfast. 15 mL 2  . silver sulfADIAZINE (SILVADENE) 1 % cream Apply pea-sized amount to wound daily. 50 g 0   No current facility-administered medications for this visit.   Allergies  Allergen Reactions  .  Basaglar Stephanie Coup [Insulin Glargine] Diarrhea  . Metformin And Related Diarrhea and Nausea And Vomiting  . Trulicity [Dulaglutide] Diarrhea     Review of Systems: All systems reviewed and negative except where noted in HPI.    Physical Exam: BP 140/84   Pulse 97   Ht 6\' 5"  (1.956 m)   Wt (!) 312 lb 2 oz (141.6 kg)   BMI 37.01 kg/m  Constitutional: Pleasant,well-developed, male in no acute distress. Neurological: Alert and oriented to person place and time. Skin: Skin is warm and dry. No rashes noted. Psychiatric: Normal mood and affect. Behavior is normal.   ASSESSMENT AND PLAN: 34 year old male here for reassessment of the  following:  Anemia - mild normocytic anemia which appears stable.  I do not see prior evaluation for this.  We will send repeat CBC along with iron studies, B12 folate, TSH to start.  If there is no evidence of iron deficiency, will refer back to his primary care for further evaluation of this.  If he does have an iron deficiency anemia will recommend endoscopic evaluation.  He agreed  Bowel habit changes - had previously chronic loose stools for few years, on and off a variety of diabetes regimens which she thinks could have been related.  Since has been on chronic antibiotics for osteomyelitis he states his symptoms have resolved, no problems with his bowels for the past several weeks.  Would monitor for now.  If this recurs after he stops antibiotics, I asked him to contact me for reassessment.  He agreed given he is feeling well and not pursue further evaluation for this right now.  33, MD [facility]

## 2019-12-13 ENCOUNTER — Other Ambulatory Visit (INDEPENDENT_AMBULATORY_CARE_PROVIDER_SITE_OTHER): Payer: BC Managed Care – PPO

## 2019-12-13 ENCOUNTER — Encounter (HOSPITAL_BASED_OUTPATIENT_CLINIC_OR_DEPARTMENT_OTHER): Payer: BC Managed Care – PPO | Attending: Internal Medicine | Admitting: Internal Medicine

## 2019-12-13 DIAGNOSIS — I10 Essential (primary) hypertension: Secondary | ICD-10-CM | POA: Insufficient documentation

## 2019-12-13 DIAGNOSIS — M869 Osteomyelitis, unspecified: Secondary | ICD-10-CM | POA: Diagnosis not present

## 2019-12-13 DIAGNOSIS — D649 Anemia, unspecified: Secondary | ICD-10-CM

## 2019-12-13 DIAGNOSIS — Z8614 Personal history of Methicillin resistant Staphylococcus aureus infection: Secondary | ICD-10-CM | POA: Insufficient documentation

## 2019-12-13 DIAGNOSIS — L97528 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ity: Secondary | ICD-10-CM | POA: Insufficient documentation

## 2019-12-13 DIAGNOSIS — R194 Change in bowel habit: Secondary | ICD-10-CM | POA: Diagnosis not present

## 2019-12-13 DIAGNOSIS — M659 Synovitis and tenosynovitis, unspecified: Secondary | ICD-10-CM | POA: Diagnosis not present

## 2019-12-13 DIAGNOSIS — Z8616 Personal history of COVID-19: Secondary | ICD-10-CM | POA: Insufficient documentation

## 2019-12-13 DIAGNOSIS — E1169 Type 2 diabetes mellitus with other specified complication: Secondary | ICD-10-CM | POA: Insufficient documentation

## 2019-12-13 DIAGNOSIS — E11621 Type 2 diabetes mellitus with foot ulcer: Secondary | ICD-10-CM | POA: Insufficient documentation

## 2019-12-13 DIAGNOSIS — L97518 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ity: Secondary | ICD-10-CM | POA: Diagnosis not present

## 2019-12-13 LAB — CBC WITH DIFFERENTIAL/PLATELET
Basophils Absolute: 0.1 10*3/uL (ref 0.0–0.1)
Basophils Relative: 1.3 % (ref 0.0–3.0)
Eosinophils Absolute: 0.2 10*3/uL (ref 0.0–0.7)
Eosinophils Relative: 4.5 % (ref 0.0–5.0)
HCT: 36.9 % — ABNORMAL LOW (ref 39.0–52.0)
Hemoglobin: 12 g/dL — ABNORMAL LOW (ref 13.0–17.0)
Lymphocytes Relative: 33.5 % (ref 12.0–46.0)
Lymphs Abs: 1.8 10*3/uL (ref 0.7–4.0)
MCHC: 32.4 g/dL (ref 30.0–36.0)
MCV: 85.8 fl (ref 78.0–100.0)
Monocytes Absolute: 0.5 10*3/uL (ref 0.1–1.0)
Monocytes Relative: 10.1 % (ref 3.0–12.0)
Neutro Abs: 2.7 10*3/uL (ref 1.4–7.7)
Neutrophils Relative %: 50.6 % (ref 43.0–77.0)
Platelets: 221 10*3/uL (ref 150.0–400.0)
RBC: 4.3 Mil/uL (ref 4.22–5.81)
RDW: 16 % — ABNORMAL HIGH (ref 11.5–15.5)
WBC: 5.4 10*3/uL (ref 4.0–10.5)

## 2019-12-13 LAB — IBC + FERRITIN
Ferritin: 51.4 ng/mL (ref 22.0–322.0)
Iron: 66 ug/dL (ref 42–165)
Saturation Ratios: 17 % — ABNORMAL LOW (ref 20.0–50.0)
Transferrin: 277 mg/dL (ref 212.0–360.0)

## 2019-12-13 LAB — VITAMIN B12: Vitamin B-12: 508 pg/mL (ref 211–911)

## 2019-12-13 LAB — FOLATE: Folate: 15 ng/mL (ref >5.9)

## 2019-12-13 LAB — TSH: TSH: 1.45 u[IU]/mL (ref 0.35–4.50)

## 2019-12-13 NOTE — Progress Notes (Signed)
[place], [name] ([id number]) Visit Report for 12/13/2019 Arrival Information Details Patient Name: Date of Service: [name] 12/13/2019 3:45 PM Medical Record Number: [id number] Patient Account Number: [id number] Date of Birth/Sex: Treating RN: Jan 16, 1986 (34 y.o. [name]) Yevonne Pax Primary Care Dachelle Molzahn: Katy Apo Other Clinician: Referring Neema Barreira: Treating Sky Primo/Extender: Demetria Pore in Treatment: 7 Visit Information History Since Last Visit All ordered tests and consults were completed: No Patient Arrived: Ambulatory Added or deleted any medications: No Arrival Time: 16:03 Any new allergies or adverse reactions: No Accompanied By: self Had a fall or experienced change in No Transfer Assistance: None activities of daily living that may affect Patient Identification Verified: Yes risk of falls: Secondary Verification Process Completed: Yes Signs or symptoms of abuse/neglect since last visito No Patient Requires Transmission-Based Precautions: No Hospitalized since last visit: No Patient Has Alerts: No Implantable device outside of the clinic excluding No cellular tissue based products placed in the center since last visit: Has Dressing in Place as Prescribed: Yes Pain Present Now: No Electronic Signature(s) Signed: 12/13/2019 4:58:04 PM By: Yevonne Pax RN Entered By: Yevonne Pax on 12/13/2019 16:03:51 -------------------------------------------------------------------------------- Clinic Level of Care Assessment Details Patient Name: Date of Service: [name] 12/13/2019 3:45 PM Medical Record Number: [id number] Patient Account Number: [id number] Date of Birth/Sex: Treating RN: January 09, 1986 (34 y.o. [name] Primary Care Selah Klang: Katy Apo Other Clinician: Referring Stillman Buenger: Treating Raiyah Speakman/Extender: Demetria Pore in Treatment: 7 Clinic Level of Care Assessment Items TOOL  4 Quantity Score X- 1 0 Use when only an EandM is performed on FOLLOW-UP visit ASSESSMENTS - Nursing Assessment / Reassessment X- 1 10 Reassessment of Co-morbidities (includes updates in patient status) X- 1 5 Reassessment of Adherence to Treatment Plan ASSESSMENTS - Wound and Skin A ssessment / Reassessment []  - 0 Simple Wound Assessment / Reassessment - one wound X- 2 5 Complex Wound Assessment / Reassessment - multiple wounds []  - 0 Dermatologic / Skin Assessment (not related to wound area) ASSESSMENTS - Focused Assessment X- 2 5 Circumferential Edema Measurements - multi extremities []  - 0 Nutritional Assessment / Counseling / Intervention []  - 0 Lower Extremity Assessment (monofilament, tuning fork, pulses) []  - 0 Peripheral Arterial Disease Assessment (using hand held doppler) ASSESSMENTS - Ostomy and/or Continence Assessment and Care []  - 0 Incontinence Assessment and Management []  - 0 Ostomy Care Assessment and Management (repouching, etc.) PROCESS - Coordination of Care X - Simple Patient / Family Education for ongoing care 1 15 []  - 0 Complex (extensive) Patient / Family Education for ongoing care X- 1 10 Staff obtains , Records, T Results / Process Orders est []  - 0 Staff telephones HHA, Nursing Homes / Clarify orders / etc []  - 0 Routine Transfer to another Facility (non-emergent condition) []  - 0 Routine Hospital Admission (non-emergent condition) []  - 0 New Admissions / / Ordering NPWT Apligraf, etc. , []  - 0 Emergency Hospital Admission (emergent condition) X- 1 10 Simple Discharge Coordination []  - 0 Complex (extensive) Discharge Coordination PROCESS - Special Needs []  - 0 Pediatric / Minor Patient Management []  - 0 Isolation Patient Management []  - 0 Hearing / Language / Visual special needs []  - 0 Assessment of Community assistance (transportation, D/C planning, etc.) []  - 0 Additional assistance / Altered  mentation []  - 0 Support Surface(s) Assessment (bed, cushion, seat, etc.) INTERVENTIONS - Wound Cleansing / Measurement []  - 0 Simple Wound Cleansing - one  wound X- 2 5 Complex Wound Cleansing - multiple wounds X- 1 5 Wound Imaging (photographs - any number of wounds) []  - 0 Wound Tracing (instead of photographs) []  - 0 Simple Wound Measurement - one wound X- 2 5 Complex Wound Measurement - multiple wounds INTERVENTIONS - Wound Dressings X - Small Wound Dressing one or multiple wounds 2 10 []  - 0 Medium Wound Dressing one or multiple wounds []  - 0 Large Wound Dressing one or multiple wounds X- 1 5 Application of Medications - topical []  - 0 Application of Medications - injection INTERVENTIONS - Miscellaneous []  - 0 External ear exam []  - 0 Specimen Collection (cultures, biopsies, blood, body fluids, etc.) []  - 0 Specimen(s) / Culture(s) sent or taken to Lab for analysis []  - 0 Patient Transfer (multiple staff / Nurse, adultHoyer Lift / Similar devices) []  - 0 Simple Staple / Suture removal (25 or less) []  - 0 Complex Staple / Suture removal (26 or more) []  - 0 Hypo / Hyperglycemic Management (close monitor of Blood Glucose) []  - 0 Ankle / Brachial Index (ABI) - do not check if billed separately X- 1 5 Vital Signs Has the patient been seen at the hospital within the last three years: Yes Total Score: 125 Level Of Care: New/Established - Level 4 Electronic Signature(s) Signed: 12/13/2019 4:40:23 PM By: Cherylin Mylarwiggins, Shannon Entered By: Cherylin Mylarwiggins, Shannon on 12/13/2019 16:29:57 -------------------------------------------------------------------------------- Encounter Discharge Information Details Patient Name: Date of Service: [name], [name] 12/13/2019 3:45 PM Medical Record Number: [id number] Patient Account Number: [id number] Date of Birth/Sex: Treating RN: 03/11/1986 (34 y.o. [name]) Boehlein, Linda Primary Care Jernee Murtaugh: Katy ApoPolite, Ronald D Other Clinician: Referring  Kensi Karr: Treating Shavonna Corella/Extender: Demetria Poreobson, Michael [name], Ronald D Weeks in Treatment: 7 Encounter Discharge Information Items Discharge Condition: Stable Ambulatory Status: Ambulatory Discharge Destination: Home Transportation: Private Auto Accompanied By: self Schedule Follow-up Appointment: Yes Clinical Summary of Care: Patient Declined Electronic Signature(s) Signed: 12/13/2019 4:47:07 PM By: Zenaida DeedBoehlein, Linda RN, BSN Entered By: Zenaida DeedBoehlein, Linda on 12/13/2019 16:46:22 -------------------------------------------------------------------------------- Lower Extremity Assessment Details Patient Name: Date of Service: [name] RMSTRO NG, [name] 12/13/2019 3:45 PM Medical Record Number: [id number] Patient Account Number: [id number] Date of Birth/Sex: Treating RN: 03/11/1986 (34 y.o. [name]) Epps, Carrie Primary Care Alainah Phang: Katy ApoPolite, Ronald D Other Clinician: Referring Cylee Dattilo: Treating Twisha Vanpelt/Extender: Myna Brightobson, Michael [name], Ronald D Weeks in Treatment: 7 Edema Assessment Assessed: [Left: No] [[name]: No] Edema: [Left: No] [[name]: No] Calf Left: [name]: Point of Measurement: 48 cm From Medial Instep 48 cm 48 cm Ankle Left: [name]: Point of Measurement: 10 cm From Medial Instep 30 cm 31 cm Electronic Signature(s) Signed: 12/13/2019 4:58:04 PM By: Yevonne PaxEpps, Carrie RN Entered By: Yevonne PaxEpps, Carrie on 12/13/2019 16:05:57 -------------------------------------------------------------------------------- Multi-Disciplinary Care Plan Details Patient Name: Date of Service: [name], [name] 12/13/2019 3:45 PM Medical Record Number: [id number] Patient Account Number: [id number] Date of Birth/Sex: Treating RN: 03/11/1986 (34 y.o. Max RightM) Dwiggins, Shannon Primary Care Hagan Maltz: Katy ApoPolite, Ronald D Other Clinician: Referring Yides Saidi: Treating Jabier Deese/Extender: Demetria Poreobson, Michael [name], Ronald D Weeks in Treatment: 7 Active Inactive Nutrition Nursing Diagnoses: Imbalanced nutrition Potential for alteratiion  in Nutrition/Potential for imbalanced nutrition Goals: Patient/caregiver agrees to and verbalizes understanding of need to use nutritional supplements and/or vitamins as prescribed Date Initiated: 10/24/2019 Target Resolution Date: 12/20/2019 Goal Status: Active Patient/caregiver will maintain therapeutic glucose control Date Initiated: 10/24/2019 Target Resolution Date: 12/20/2019 Goal Status: Active Interventions: Assess HgA1c results as ordered upon admission and as needed Assess patient nutrition upon admission and as needed per policy Provide  education on elevated blood sugars and impact on wound healing Provide education on nutrition Treatment Activities: Education provided on Nutrition : 10/24/2019 Notes: Wound/Skin Impairment Nursing Diagnoses: Impaired tissue integrity Knowledge deficit related to ulceration/compromised skin integrity Goals: Patient/caregiver will verbalize understanding of skin care regimen Date Initiated: 10/24/2019 Target Resolution Date: 01/10/2020 Goal Status: Active Ulcer/skin breakdown will have a volume reduction of 30% by week 4 Date Initiated: 10/24/2019 Target Resolution Date: 01/10/2020 Goal Status: Active Interventions: Assess patient/caregiver ability to obtain necessary supplies Assess patient/caregiver ability to perform ulcer/skin care regimen upon admission and as needed Assess ulceration(s) every visit Provide education on ulcer and skin care Notes: Electronic Signature(s) Signed: 12/13/2019 4:40:23 PM By: Cherylin Mylar Entered By: Cherylin Mylar on 12/13/2019 15:53:13 -------------------------------------------------------------------------------- Pain Assessment Details Patient Name: Date of Service: [name] D 12/13/2019 3:45 PM Medical Record Number: [id number] Patient Account Number: [id number] Date of Birth/Sex: Treating RN: 11-Jan-1986 (34 y.o. [name] Primary Care Khamryn Calderone: Katy Apo Other  Clinician: Referring Yovany Clock: Treating Chelli Yerkes/Extender: Myna Bright Weeks in Treatment: 7 Active Problems Location of Pain Severity and Description of Pain Patient Has Paino No Site Locations Pain Management and Medication Current Pain Management: Electronic Signature(s) Signed: 12/13/2019 4:58:04 PM By: Yevonne Pax RN Entered By: Yevonne Pax on 12/13/2019 16:04:28 -------------------------------------------------------------------------------- Patient/Caregiver Education Details Patient Name: Date of Service: [name] D 9/10/2021andnbsp3:45 PM Medical Record Number: [id number] Patient Account Number: [id number] Date of Birth/Gender: Treating RN: [birth year] (34 y.o. [name] Primary Care Physician: Katy Apo Other Clinician: Referring Physician: Treating Physician/Extender: Demetria Pore in Treatment: 7 Education Assessment Education Provided To: Patient Education Topics Provided Nutrition: Handouts: Elevated Blood Sugars: How Do They Affect Wound Healing Methods: Explain/Verbal Responses: State content correctly Wound/Skin Impairment: Handouts: Caring for Your Ulcer Methods: Explain/Verbal Responses: State content correctly Electronic Signature(s) Signed: 12/13/2019 4:40:23 PM By: Cherylin Mylar Entered By: Cherylin Mylar on 12/13/2019 15:53:36 -------------------------------------------------------------------------------- Wound Assessment Details Patient Name: Date of Service: [name] D 12/13/2019 3:45 PM Medical Record Number: [id number] Patient Account Number: [id number] Date of Birth/Sex: Treating RN: 1985-07-08 (34 y.o. [name]) Yevonne Pax Primary Care Jeniel Slauson: Katy Apo Other Clinician: Referring Telesia Ates: Treating Raelin Pixler/Extender: Myna Bright Weeks in Treatment: 7 Wound Status Wound Number: 2 Primary Etiology: Diabetic Wound/Ulcer of  the Lower Extremity Wound Location: [name], Lateral Foot Wound Status: Open Wounding Event: Gradually Appeared Comorbid History: Type II Diabetes Date Acquired: 09/03/2019 Weeks Of Treatment: 7 Clustered Wound: No Wound Measurements Length: (cm) 0.9 Width: (cm) 0.3 Depth: (cm) 0.1 Area: (cm) 0.212 Volume: (cm) 0.021 % Reduction in Area: 96.2% % Reduction in Volume: 96.2% Epithelialization: Small (1-33%) Tunneling: No Undermining: No Wound Description Classification: Grade 2 Wound Margin: Flat and Intact Exudate Amount: Medium Exudate Type: Serosanguineous Exudate Color: red, brown Foul Odor After Cleansing: No Slough/Fibrino Yes Wound Bed Granulation Amount: Large (67-100%) Exposed Structure Granulation Quality: Red Fascia Exposed: No Necrotic Amount: Small (1-33%) Fat Layer (Subcutaneous Tissue) Exposed: Yes Necrotic Quality: Adherent Slough Tendon Exposed: No Muscle Exposed: No Joint Exposed: No Bone Exposed: No Treatment Notes Wound #2 ([name], Lateral Foot) 2. Periwound Care Moisturizing lotion 3. Primary Dressing Applied Hydrofera Blue 4. Secondary Dressing Dry Gauze Roll Gauze Foam 7. Footwear/Offloading device applied Surgical shoe Electronic Signature(s) Signed: 12/13/2019 4:58:04 PM By: Yevonne Pax RN Entered By: Yevonne Pax on 12/13/2019 16:06:31 -------------------------------------------------------------------------------- Wound Assessment Details Patient Name: Date of Service: [name], [name] 12/13/2019 3:45  PM Medical Record Number: [id number] Patient Account Number: [id number] Date of Birth/Sex: Treating RN: [birth year] (34 y.o. [name] Primary Care Yanitza Shvartsman: Katy Apo Other Clinician: Referring Itzae Mccurdy: Treating Thelia Tanksley/Extender: Demetria Pore in Treatment: 7 Wound Status Wound Number: 3 Primary Etiology: Diabetic Wound/Ulcer of the Lower Extremity Wound Location: Left, Lateral  Foot Wound Status: Open Wounding Event: Trauma Comorbid History: Type II Diabetes Date Acquired: 10/02/2019 Weeks Of Treatment: 7 Clustered Wound: No Wound Measurements Length: (cm) 0.1 Width: (cm) 0.1 Depth: (cm) 0.1 Area: (cm) 0.008 Volume: (cm) 0.001 % Reduction in Area: 99.5% % Reduction in Volume: 99.4% Epithelialization: Large (67-100%) Tunneling: No Undermining: No Wound Description Classification: Grade 2 Wound Margin: Flat and Intact Exudate Amount: Medium Exudate Type: Serosanguineous Exudate Color: red, brown Foul Odor After Cleansing: No Slough/Fibrino No Wound Bed Granulation Amount: Large (67-100%) Exposed Structure Granulation Quality: Pink Fascia Exposed: No Necrotic Amount: None Present (0%) Fat Layer (Subcutaneous Tissue) Exposed: Yes Tendon Exposed: No Muscle Exposed: No Joint Exposed: No Bone Exposed: No Treatment Notes Wound #3 (Left, Lateral Foot) 2. Periwound Care Moisturizing lotion 3. Primary Dressing Applied Hydrofera Blue 4. Secondary Dressing Dry Gauze Roll Gauze Foam 7. Footwear/Offloading device applied Surgical shoe Electronic Signature(s) Signed: 12/13/2019 4:40:23 PM By: Cherylin Mylar Entered By: Cherylin Mylar on 12/13/2019 16:36:22 -------------------------------------------------------------------------------- Vitals Details Patient Name: Date of Service: [name], [name] 12/13/2019 3:45 PM Medical Record Number: [id number] Patient Account Number: [id number] Date of Birth/Sex: Treating RN: [birth year] (34 y.o. [name]) Yevonne Pax Primary Care Reinhardt Licausi: Katy Apo Other Clinician: Referring Jeramy Dimmick: Treating Peyten Weare/Extender: Demetria Pore in Treatment: 7 Vital Signs Time Taken: 16:04 Temperature (F): 98.4 Height (in): 77 Pulse (bpm): 91 Weight (lbs): 280 Respiratory Rate (breaths/min): 18 Body Mass Index (BMI): 33.2 Blood Pressure (mmHg): 144/87 Reference Range: 80 - 120  mg / dl Electronic Signature(s) Signed: 12/13/2019 4:58:04 PM By: Yevonne Pax RN Entered By: Yevonne Pax on 12/13/2019 16:04:22

## 2019-12-14 ENCOUNTER — Other Ambulatory Visit: Payer: Self-pay | Admitting: Internal Medicine

## 2019-12-14 DIAGNOSIS — L089 Local infection of the skin and subcutaneous tissue, unspecified: Secondary | ICD-10-CM

## 2019-12-14 DIAGNOSIS — E11628 Type 2 diabetes mellitus with other skin complications: Secondary | ICD-10-CM

## 2019-12-16 NOTE — Progress Notes (Signed)
[place], [name] ([id number]) Visit Report for 12/13/2019 HPI Details Patient Name: Date of Service: [name] 12/13/2019 3:45 PM Medical Record Number: [id number] Patient Account Number: [id number] Date of Birth/Sex: Treating RN: Mar 09, 1986 (34 y.o. 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 He comes in for review of a wound over the plantar fifth metatarsal head extending into the lateral part of the foot. He was followed for this previously by his podiatrist Dr. Cornelius Moras. As the patient tells his story he went to see podiatry first for a swelling he developed on the lateral part of his fifth metatarsal head in May. He states this was "open" by podiatry and the area closed. He was followed up in June and it was again opened callus removed and it closed promptly. There were plans being made for surgery on the fifth metatarsal head in June however his blood sugar was apparently too high for anesthesia. Apparently the area was debrided and opened again in June and it is never closed since. Looking over the records from podiatry I am really not able to follow this. It was clear when he was first seen it was before 5/14 at that point he already had a wound. By 5/17 the ulcer was resolved. I do not see anything about a procedure. On 5/28 noted to have pre-ulcerative moderate keratosis. X-ray noted 1/5 contracted toe and tailor's bunion and metatarsal deformity. On a visit date on 09/28/2018 the dorsal part of the left foot it healed and resolved. There was concern about swelling in his lower extremity he was sent to the ER.. As far as I can tell he was seen in the ER on 7/12 with an ulcer on his left foot. A DVT rule out of the left  leg was negative. I do not think I have complete records from podiatry but I am not able to verify the procedures this patient states he had. He states after the last procedure the wound has never closed although I am not able to follow this in the records I have from podiatry. He has not had a recent x-ray The patient has been using Neosporin on the wound. He is wearing a Darco shoe. He is still very active up on his foot working and exercising. Past medical history; type 2 diabetes ketosis-prone, leg swelling with a negative DVT study in July. Non-smoker ABI in our clinic was 0.85 on the left 10/16; substantial wound on the plantar left fifth met head extending laterally almost to the dorsal fifth MTP. We have been using silver alginate we gave him a Darco forefoot off loader. An x-ray did not show evidence of osteomyelitis did note soft tissue emphysema which I think was due to gas tracking through an open wound. There is no doubt in my mind he requires an MRI 10/23; MRI not booked until 3 November at the earliest this is largely due to his glucose sensor in the right arm. We have been using silver alginate. There has been an improvement 10/29; I am still not exactly sure when his MRI is booked for. He says it is the third but it is the 10th in epic. This definitely needs to be done. He is running a low-grade fever today but no other symptoms. No real improvement  in the 1 02/26/2019 patient presents today for a follow-up visit here in our clinic he is last been seen in the clinic on October 29. Subsequently we were working on getting MRI to evaluate and see what exactly was going on and where we would need to go from the standpoint of whether or not he had osteomyelitis and again what treatments were going be required. Subsequently the patient ended up being admitted to the hospital on 02/07/2019 and was discharged on 02/14/2019. This is a somewhat interesting admission with a discharge diagnosis  of pneumonia due to COVID-19 although he was positive for COVID-19 when tested at the urgent care but negative x2 when he was actually in the hospital. With that being said he did have acute respiratory failure with hypoxia and it was noted he also have a left foot ulceration with osteomyelitis. With that being said he did require oxygen for his pneumonia and I level 4 L. He was placed on antivirals and steroids for the COVID-19. He was also transferred to the [facility] at one point. Nonetheless he did subsequently discharged home and since being home has done much better in that regard. The CT angiogram did not show any pulmonary embolism. With regard to the osteomyelitis the patient was placed on vancomycin and Zosyn while in the hospital but has been changed to Augmentin at discharge. It was also recommended that he follow- up with wound care and podiatry. Podiatry however wanted him to see [name] according to the patient prior to them doing anything further. His hemoglobin A1c was 9.9 as noted in the hospital. Have an MRI of the left foot performed while in the hospital on 02/04/2019. 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 There is an overlying plantar open wound noted an abscess tracking back along the lateral aspect of the fifth metatarsal shaft. There is otherwise diffuse cellulitis and mild fasciitis without findings of polymyositis. The patient did have recently pneumonia secondary to COVID-19 I looked in the chart through epic and it does appear that the patient may need to have an additional x-ray just to ensure everything is cleared and that he has no airspace disease prior to putting him into the [name]. 03/05/2019; patient was readmitted to the clinic last week. He was hospitalized twice for a viral upper respiratory tract infection from 11/1 through 11/4 and then 11/5 through 11/12  ultimately this turned out to be Covid pneumonitis. Although he was discharged on oxygen he is not using it. He says he feels fine. He has no exercise limitation no cough no sputum. His O2 sat in our clinic today was 100% on room air. 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 He received Vanco and Zosyn in the hospital and then was discharged on 2 weeks of Augmentin. I do not see any relevant cultures. He was supposed to follow-up with infectious disease but I do not see that he has an appointment. 12/8; patient saw Dr. Novella Olive of infectious disease last week. He felt that he had had adequate antibiotic therapy. He did not go to follow-up with Dr. Amalia Hailey of podiatry and I have again talked to him about the pros and cons of this. He does not want to consider a ray amputation of this time. He is aware of the risks of  recurrence, migration etc. He started HBO today and tolerated this well. He can complete the Augmentin that I gave him last week. I have looked over the lab work that Dr. Chana Bode ordered his C-reactive protein was 3.3 and his sedimentation rate was 17. The C-reactive protein is never really been measurably that high in this patient 12/15; not much change in the wound today however he has undermining along the lateral part of the foot again more extensively than last week. He has some rims of epithelialization. We have been using silver alginate. He is undergoing hyperbarics but did not dive today 12/18; in for his obligatory first total contact cast change. Unfortunately there was pus coming from the undermining area around his fifth metatarsal head. This was cultured but will preclude reapplication of a cast. He is seen in conjunction with HBO 12/24; patient had staph lugdunensis in the wound in the undermining area laterally last time. We put him on doxycycline which should  have covered this. The wound looks better today. I am going to give him another week of doxycycline before reattempting the total contact cast 12/31; the patient is completing antibiotics. Hemorrhagic debris in the distal part of the wound with some undermining distally. He also had hyper granulation. Extensive debridement with a #5 curette. The infected area that was on the lateral part of the fifth met head is closed over. I do not think he needs any more antibiotics. Patient was seen prior to HBO. Preparations for a total contact cast were made in the cast will be placed post hyperbarics 04/11/19; once again the patient arrives today without complaint. He had been in a cast all week noted that he had heavy drainage this week. This resulted in large raised areas of macerated tissue around the wound 1/14; wound bed looks better slightly smaller. Hydrofera Blue has been changing himself. He had a heavy drainage last week which caused a lot of maceration around the wound so I took him out of a total contact cast he says the drainage is actually better this week He is seen today in conjunction with HBO 1/21; returns to clinic. He was up in [place] for a day or 2 attending a funeral. He comes back in with the wound larger and with a large area of exposed bone. He had osteomyelitis and septic arthritis of the fifth left metatarsal head while he was in hospital. He received IV antibiotics in the hospital for a prolonged period of time then 3 weeks of Augmentin. Subsequently I gave him 2 weeks of doxycycline for more superficial wound infection. When I saw this last week the wound was smaller the surface of the wound looks satisfactory. 1/28; patient missed hyperbarics today. Bone biopsy I did last time showed Enterococcus faecalis and Staphylococcus lugdunensis . He has a wide area of exposed bone. We are going to use silver alginate as of today. I had another ethical discussion with the patient. 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 In this situation I think the likelihood of healing this is low. Therefore I have recommended a ray amputation and with the patient's agreement I have referred him to Dr. Doran Durand. The other issue is that his compliance with hyperbarics has been minimal because of his work schedule and given his underlying decision I am going to stop this today READMISSION 10/24/2019 MRI 09/29/2019 left foot IMPRESSION: 1. Apparent skin ulceration inferior and lateral to the 5th metatarsal base with underlying heterogeneous T2 signal  and enhancement in the subcutaneous fat. Small peripherally enhancing fluid collections along the plantar and lateral aspects of the 5th metatarsal base suspicious for abscesses. 2. Interval amputation through the mid 5th metatarsal with nonspecific low-level marrow edema and enhancement. Given the proximity to the adjacent soft tissue inflammatory changes, osteomyelitis cannot be excluded. 3. The additional bones appear unremarkable. MRI 09/29/2019 right foot IMPRESSION: 1. Soft tissue ulceration lateral to the 5th MTP joint. There is low-level T2 hyperintensity within the 4th and 5th metatarsal heads and adjacent proximal phalanges without abnormal T1 signal or cortical destruction. These findings are nonspecific and could be seen with early marrow edema, hyperemia or early osteomyelitis. No evidence of septic joint. 2. Mild tenosynovitis and synovial enhancement associated with the extensor digitorum tendons at the level of the midfoot. 3. Diffuse low-level muscular T2 hyperintensity and enhancement, most consistent with diabetic myopathy. LEFT FOOT BONE Methicillin resistant staphylococcus aureus Staphylococcus lugdunensis MIC MIC CIPROFLOXACIN >=8 RESISTANT Resistant <=0.5 SENSI... Sensitive CLINDAMYCIN <=0.25 SENS... Sensitive >=8 RESISTANT Resistant ERYTHROMYCIN >=8 RESISTANT Resistant >=8  RESISTANT Resistant GENTAMICIN <=0.5 SENSI... Sensitive <=0.5 SENSI... Sensitive Inducible Clindamycin NEGATIVE Sensitive NEGATIVE Sensitive OXACILLIN >=4 RESISTANT Resistant 2 SENSITIVE Sensitive RIFAMPIN <=0.5 SENSI... Sensitive <=0.5 SENSI... Sensitive TETRACYCLINE <=1 SENSITIVE Sensitive <=1 SENSITIVE Sensitive TRIMETH/SULFA <=10 SENSIT Sensitive <=10 SENSIT Sensitive ... Marland Kitchen.. VANCOMYCIN 1 SENSITIVE Sensitive <=0.5 SENSI... Sensitive Right foot bone . Component 3 wk ago Specimen Description BONE Special Requests RIGHT 4 METATARSAL SAMPLE B Gram Stain NO WBC SEEN NO ORGANISMS SEEN Culture RARE METHICILLIN RESISTANT STAPHYLOCOCCUS AUREUS NO ANAEROBES ISOLATED Performed at [facility], [place] [address], [place], [place] [address] Report Status 10/08/2019 FINAL Organism ID, Bacteria METHICILLIN RESISTANT STAPHYLOCOCCUS AUREUS Resulting Agency [facility] Susceptibility Methicillin resistant staphylococcus aureus MIC CIPROFLOXACIN >=8 RESISTANT Resistant CLINDAMYCIN <=0.25 SENS... Sensitive ERYTHROMYCIN >=8 RESISTANT Resistant GENTAMICIN <=0.5 SENSI... Sensitive Inducible Clindamycin NEGATIVE Sensitive OXACILLIN >=4 RESISTANT Resistant RIFAMPIN <=0.5 SENSI... Sensitive TETRACYCLINE <=1 SENSITIVE Sensitive TRIMETH/SULFA <=10 SENSIT Sensitive ... VANCOMYCIN 1 SENSITIVE Sensitive This is a patient we had in clinic earlier this year with a wound over his left fifth metatarsal head. 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 Doran Durand on 05/16/2019. He seems to have developed problems with wounds on his bilateral feet in June 2021 although he may have had problems earlier than this. He was in an urgent  care with a right foot ulcer on 09/26/2019 and given a course of doxycycline. This was apparently after having trouble getting into see orthopedics. He was seen by podiatry on 09/28/2019 noted to have bilateral lower extremity ulcers including the left lateral fifth metatarsal base and the right subfifth met head. It was noted that had purulent drainage at that time. He required hospitalization from 6/20 through 7/2. This was because of worsening right foot wounds. He underwent bilateral operative incision and drainage and bone biopsies bilaterally. Culture results are listed above. He has been referred back to clinic by Dr. Jacqualyn Posey of podiatry. He is also followed by Dr. Megan Salon who saw him yesterday. He was discharged from hospital on Zyvox Flagyl and Levaquin and yesterday changed to doxycycline Flagyl and Levaquin. His inflammatory markers on 6/26 showed a sedimentation rate of 129 and a C-reactive protein of 5. This is  improved to 14 and 1.3 respectively. This would indicate improvement. ABIs in our clinic today were 1.23 on the right and 1.20 on the left 11/01/2019 on evaluation today patient appears to be doing fairly well in regard to the wounds on his feet at this point. Fortunately there is no signs of active infection at this time. No fevers, chills, nausea, vomiting, or diarrhea. He currently is seeing infectious disease and still under their care at this point. Subsequently he also has both wounds which she has not been using collagen on as he did not receive that in his packaging he did not call us and let us know that. Apparently that just was missed on the order. Nonetheless we will get that straightened out today. 8/9-Patient returns for bilateral foot wounds, using Prisma with hydrogel moistened dressings, and the wounds appear stable. Patient using surgical shoes, avoiding much pressure or weightbearing as much as possible 8/16; patient has bilateral foot wounds. 1 on the right  lateral foot proximally the other is on the left mid lateral foot. Both required debridement of callus and thick skin around the wounds. We have been using silver collagen 8/27; patient has bilateral lateral foot wounds. The area on the left substantially surrounded by callus and dry skin. This was removed from the wound edge. The underlying wound is small. The area on the right measured somewhat smaller today. 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 Unfortunately I did not update his antibiotics during today's visit. 9/10 I reviewed Dr. Hale Bogus last notes he felt he had completed antibiotics his inflammatory markers were reasonably well controlled. He has a small wound on the lateral left foot and a tiny area on the right which is just above closed. He is using Hydrofera Blue with border foam he has bilateral surgical shoes Electronic Signature(s) Signed: 12/16/2019 7:26:40 PM By: [name] MD Entered By: [name] on 12/13/2019 17:03:01 -------------------------------------------------------------------------------- Physical Exam Details Patient Name: Date of Service: [name], [name] 12/13/2019 3:45 PM Medical Record Number: [id number] Patient Account Number: [id number] Date of Birth/Sex: Treating RN: 1985-07-14 (34 y.o. M) Primary Care Provider: [name] Other Clinician: Referring Provider: Treating Provider/Extender: Delton See in Treatment: 7 Constitutional Patient is hypertensive.. Pulse regular and within target range for patient.Marland Kitchen Respirations regular, non-labored and within target range.. Temperature is normal and within the target range for the patient.Marland Kitchen Appears in no distress. Cardiovascular Pedal pulses are palpable. Notes Wound exam; left lateral foot and the right lateral foot. The area on the right which is on the fifth met head lateral aspect is just  about closed. The area on the base of the left fifth metatarsal dry but very superficial. Electronic Signature(s) Signed: 12/16/2019 7:26:40 PM By: [name] MD Entered By: [name] on 12/13/2019 17:07:01 -------------------------------------------------------------------------------- Physician Orders Details Patient Name: Date of Service: [name], [name] 12/13/2019 3:45 PM Medical Record Number: [id number] Patient Account Number: [id number] Date of Birth/Sex: Treating RN: 10-Apr-1985 (34 y.o. Marvis Repress Primary Care Provider: [name] Other Clinician: Referring Provider: Treating Provider/Extender: Delton See in Treatment: 7 Verbal / Phone Orders: No Diagnosis Coding ICD-10 Coding Code Description E11.621 Type 2 diabetes mellitus with foot ulcer L97.518 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ity L97.528 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ity 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,  right ankle and foot B95.62 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 classified elsewhere Follow-up Appointments Return Appointment in 2 weeks. Dressing Change Frequency Wound #2 Right,Lateral Foot Change Dressing every other day. Wound #3 Left,Lateral Foot Change Dressing every other day. Skin Barriers/Peri-Wound Care Moisturizing lotion - both legs and feet Wound Cleansing May shower and wash wound with soap and water. - on days that dressing is changed Primary Wound Dressing Wound #2 Right,Lateral Foot Hydrofera Blue Wound #3 Left,Lateral Foot Hydrofera Blue Secondary Dressing Wound #2 Right,Lateral Foot Foam - foam donut Kerlix/Rolled Gauze Dry Gauze Wound #3 Left,Lateral Foot Foam - foam donut Kerlix/Rolled Gauze Dry Gauze Off-Loading Open toe surgical shoe to: - both feet Electronic  Signature(s) Signed: 12/13/2019 4:40:23 PM By: Kela Millin Signed: 12/16/2019 7:26:40 PM By: [name] MD Entered By: Kela Millin on 12/13/2019 15:52:49 -------------------------------------------------------------------------------- Problem List Details Patient Name: Date of Service: [name], [name] 12/13/2019 3:45 PM Medical Record Number: [id number] Patient Account Number: [id number] Date of Birth/Sex: Treating RN: 03/09/1986 (34 y.o. Marvis Repress Primary Care Provider: [name] Other Clinician: Referring Provider: Treating Provider/Extender: Delton See in Treatment: 7 Active Problems ICD-10 Encounter Code Description Active Date MDM Diagnosis E11.621 Type 2 diabetes mellitus with foot ulcer 10/24/2019 No Yes L97.518 Non-pressure chronic ulcer of other part of right foot with other specified 10/24/2019 No Yes severity L97.528 Non-pressure chronic ulcer of other part of left foot with other specified 10/24/2019 No Yes severity 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, right ankle and foot 10/24/2019 No Yes B95.62 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 10/24/2019 No Yes classified elsewhere Inactive Problems ICD-10 Code Description Active Date Inactive Date 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 He comes in for review of a wound over the plantar fifth metatarsal head extending into the lateral part of the foot. He was followed for this previously by his podiatrist Dr. Cornelius Moras. As the patient tells his story he went to see podiatry first for a swelling he developed on the lateral part of his fifth metatarsal head in May. He states this was "open" by podiatry and the area closed. He was followed up in June and it was again opened callus removed and it closed promptly. There were plans being made for surgery on the fifth metatarsal head in June however his blood sugar was apparently too high for anesthesia. Apparently the area was debrided and opened again in June and it is never closed since. Looking over the records from podiatry I am really not able to follow this. It was clear when he was first seen it was before 5/14 at that point he already had a wound. By 5/17 the ulcer was resolved. I do not see anything about a procedure. On 5/28 noted to have pre-ulcerative moderate keratosis. X-ray  noted 1/5 contracted toe and tailor's bunion and metatarsal deformity. On a visit date on 09/28/2018 the dorsal part of the left foot it healed and resolved. There was concern about swelling in his lower extremity he was sent to the ER.. As far as I can tell he was seen in the ER on 7/12 with an ulcer on his left foot. A DVT rule out of the left leg was negative. I do not think I have complete records from podiatry but I am not able to verify the procedures this patient states he had. He states after the last procedure the wound has never closed although I am not able to follow this in the records I have from podiatry. He has not had  a recent x-ray The patient has been using Neosporin on the wound. He is wearing a Darco shoe. He is still very active up on his foot working and exercising. Past medical history; type 2 diabetes ketosis-prone, leg swelling with a negative DVT study in July. Non-smoker ABI in our clinic was 0.85 on the left 10/16; substantial wound on the plantar left fifth met head extending laterally almost to the dorsal fifth MTP. We have been using silver alginate we gave him a Darco forefoot off loader. An x-ray did not show evidence of osteomyelitis did note soft tissue emphysema which I think was due to gas tracking through an open wound. There is no doubt in my mind he requires an MRI 10/23; MRI not booked until 3 November at the earliest this is largely due to his glucose sensor in the right arm. We have been using silver alginate. There has been an improvement 10/29; I am still not exactly sure when his MRI is booked for. He says it is the third but it is the 10th in epic. This definitely needs to be done. He is running a low-grade fever today but no other symptoms. No real improvement in the 1 02/26/2019 patient presents today for a follow-up visit here in our clinic he is last been seen in the clinic on October 29. Subsequently we were working on getting MRI to evaluate and see what exactly was going on and where we would need to go from the standpoint of whether or not he had osteomyelitis and again what treatments were going be required. Subsequently the patient ended up being admitted to the hospital on 02/07/2019 and was discharged on 02/14/2019. This is a somewhat interesting admission with a discharge diagnosis of pneumonia due to COVID-19 although he was positive for COVID-19 when tested at the urgent care but negative x2 when he was actually in the hospital. With that being said he did have acute respiratory failure with hypoxia and it was noted he also have a left foot ulceration with  osteomyelitis. With that being said he did require oxygen for his pneumonia and I level 4 L. He was placed on antivirals and steroids for the COVID-19. He was also transferred to the [facility] at one point. Nonetheless he did subsequently discharged home and since being home has done much better in that regard. The CT angiogram did not show any pulmonary embolism. With regard to the osteomyelitis the patient was placed on vancomycin and Zosyn while in the hospital but has been changed to Augmentin at discharge. It was also recommended that he follow- up with wound care and podiatry. Podiatry however wanted him to see [name] according to the patient prior to them doing anything further. His hemoglobin A1c  was 9.9 as noted in the hospital. Have an MRI of the left foot performed while in the hospital on 02/04/2019. 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 There is an overlying plantar open wound noted an abscess tracking back along the lateral aspect of the fifth metatarsal shaft. There is otherwise diffuse cellulitis and mild fasciitis without findings of polymyositis. The patient did have recently pneumonia secondary to COVID-19 I looked in the chart through epic and it does appear that the patient may need to have an additional x-ray just to ensure everything is cleared and that he has no airspace disease prior to putting him into the [name]. 03/05/2019; patient was readmitted to the clinic last week. He was hospitalized twice for a viral upper respiratory tract infection from 11/1 through 11/4 and then 11/5 through 11/12 ultimately this turned out to be Covid pneumonitis. Although he was discharged on oxygen he is not using it. He says he feels fine. He has no exercise limitation no cough no sputum. His O2 sat in our clinic today was 100% on room air. He did manage to have his MRI which showed septic  arthritis at the fifth MTP joint and osteomyelitis involving the fifth metatarsal head and proximal phalanx. He received Vanco and Zosyn in the hospital and then was discharged on 2 weeks of Augmentin. I do not see any relevant cultures. He was supposed to follow-up with infectious disease but I do not see that he has an appointment. 12/8; patient saw Dr. Novella Olive of infectious disease last week. He felt that he had had adequate antibiotic therapy. He did not go to follow-up with Dr. Amalia Hailey of podiatry and I have again talked to him about the pros and cons of this. He does not want to consider a ray amputation of this time. He is aware of the risks of recurrence, migration etc. He started HBO today and tolerated this well. He can complete the Augmentin that I gave him last week. I have looked over the lab work that Dr. Chana Bode ordered his C-reactive protein was 3.3 and his sedimentation rate was 17. The C-reactive protein is never really been measurably that high in this patient 12/15; not much change in the wound today however he has undermining along the lateral part of the foot again more extensively than last week. He has some rims of epithelialization. We have been using silver alginate. He is undergoing hyperbarics but did not dive today 12/18; in for his obligatory first total contact cast change. Unfortunately there was pus coming from the undermining area around his fifth metatarsal head. This was cultured but will preclude reapplication of a cast. He is seen in conjunction with HBO 12/24; patient had staph lugdunensis in the wound in the undermining area laterally last time. We put him on doxycycline which should have covered this. The wound looks better today. I am going to give him another week of doxycycline before reattempting the total contact cast 12/31; the patient is completing antibiotics. Hemorrhagic debris in the distal part of the wound with some undermining distally. He also had  hyper granulation. Extensive debridement with a #5 curette. The infected area that was on the lateral part of the fifth met head is closed over. I do not think he needs any more antibiotics. Patient was seen prior to HBO. Preparations for a total contact cast were made in the cast will be placed post hyperbarics  04/11/19; once again the patient arrives today without complaint. He had been in a cast all week noted that he had heavy drainage this week. This resulted in large raised areas of macerated tissue around the wound 1/14; wound bed looks better slightly smaller. Hydrofera Blue has been changing himself. He had a heavy drainage last week which caused a lot of maceration around the wound so I took him out of a total contact cast he says the drainage is actually better this week He is seen today in conjunction with HBO 1/21; returns to clinic. He was up in [place] for a day or 2 attending a funeral. He comes back in with the wound larger and with a large area of exposed bone. He had osteomyelitis and septic arthritis of the fifth left metatarsal head while he was in hospital. He received IV antibiotics in the hospital for a prolonged period of time then 3 weeks of Augmentin. Subsequently I gave him 2 weeks of doxycycline for more superficial wound infection. When I saw this last week the wound was smaller the surface of the wound looks satisfactory. 1/28; patient missed hyperbarics today. Bone biopsy I did last time showed Enterococcus faecalis and Staphylococcus lugdunensis . He has a wide area of exposed bone. We are going to use silver alginate as of today. I had another ethical discussion with the patient. 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 In this situation I think the likelihood of healing this is low. Therefore I have recommended a ray amputation and with the patient's agreement I have referred him to Dr. Victorino Dike. The other issue is that his  compliance with hyperbarics has been minimal because of his work schedule and given his underlying decision I am going to stop this today READMISSION 10/24/2019 MRI 09/29/2019 left foot IMPRESSION: 1. Apparent skin ulceration inferior and lateral to the 5th metatarsal base with underlying heterogeneous T2 signal and enhancement in the subcutaneous fat. Small peripherally enhancing fluid collections along the plantar and lateral aspects of the 5th metatarsal base suspicious for abscesses. 2. Interval amputation through the mid 5th metatarsal with nonspecific low-level marrow edema and enhancement. Given the proximity to the adjacent soft tissue inflammatory changes, osteomyelitis cannot be excluded. 3. The additional bones appear unremarkable. MRI 09/29/2019 right foot IMPRESSION: 1. Soft tissue ulceration lateral to the 5th MTP joint. There is low-level T2 hyperintensity within the 4th and 5th metatarsal heads and adjacent proximal phalanges without abnormal T1 signal or cortical destruction. These findings are nonspecific and could be seen with early marrow edema, hyperemia or early osteomyelitis. No evidence of septic joint. 2. Mild tenosynovitis and synovial enhancement associated with the extensor digitorum tendons at the level of the midfoot. 3. Diffuse low-level muscular T2 hyperintensity and enhancement, most consistent with diabetic myopathy. LEFT FOOT BONE Methicillin resistant staphylococcus aureus Staphylococcus lugdunensis MIC MIC CIPROFLOXACIN >=8 RESISTANT Resistant <=0.5 SENSI... Sensitive CLINDAMYCIN <=0.25 SENS... Sensitive >=8 RESISTANT Resistant ERYTHROMYCIN >=8 RESISTANT Resistant >=8 RESISTANT Resistant GENTAMICIN <=0.5 SENSI... Sensitive <=0.5 SENSI... Sensitive Inducible Clindamycin NEGATIVE Sensitive NEGATIVE Sensitive OXACILLIN >=4 RESISTANT Resistant 2 SENSITIVE Sensitive RIFAMPIN <=0.5 SENSI... Sensitive <=0.5 SENSI... Sensitive TETRACYCLINE <=1 SENSITIVE  Sensitive <=1 SENSITIVE Sensitive TRIMETH/SULFA <=10 SENSIT Sensitive <=10 SENSIT Sensitive ... Marland Kitchen.. VANCOMYCIN 1 SENSITIVE Sensitive <=0.5 SENSI... Sensitive Right foot bone . Component 3 wk ago Specimen Description BONE Special Requests RIGHT 4 METATARSAL SAMPLE B Gram Stain NO WBC SEEN NO ORGANISMS SEEN Culture RARE METHICILLIN RESISTANT STAPHYLOCOCCUS AUREUS NO ANAEROBES ISOLATED Performed  at [facility], [place] [address], [place], [place] [address] Report Status 10/08/2019 FINAL Organism ID, Bacteria METHICILLIN RESISTANT STAPHYLOCOCCUS AUREUS Resulting Agency [facility] Susceptibility Methicillin resistant staphylococcus aureus MIC CIPROFLOXACIN >=8 RESISTANT Resistant CLINDAMYCIN <=0.25 SENS... Sensitive ERYTHROMYCIN >=8 RESISTANT Resistant GENTAMICIN <=0.5 SENSI... Sensitive Inducible Clindamycin NEGATIVE Sensitive OXACILLIN >=4 RESISTANT Resistant RIFAMPIN <=0.5 SENSI... Sensitive TETRACYCLINE <=1 SENSITIVE Sensitive TRIMETH/SULFA <=10 SENSIT Sensitive ... VANCOMYCIN 1 SENSITIVE Sensitive This is a patient we had in clinic earlier this year with a wound over his left fifth metatarsal head. 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 Doran Durand on 05/16/2019. He seems to have developed problems with wounds on his bilateral feet in June 2021 although he may have had problems earlier than this. He was in an urgent care with a right foot ulcer on 09/26/2019 and given a course of doxycycline. This was apparently after having trouble getting into see orthopedics. He was seen by podiatry on 09/28/2019 noted to have bilateral lower extremity ulcers including the left lateral fifth metatarsal base and the  right subfifth met head. It was noted that had purulent drainage at that time. He required hospitalization from 6/20 through 7/2. This was because of worsening right foot wounds. He underwent bilateral operative incision and drainage and bone biopsies bilaterally. Culture results are listed above. He has been referred back to clinic by Dr. Jacqualyn Posey of podiatry. He is also followed by Dr. Megan Salon who saw him yesterday. He was discharged from hospital on Zyvox Flagyl and Levaquin and yesterday changed to doxycycline Flagyl and Levaquin. His inflammatory markers on 6/26 showed a sedimentation rate of 129 and a C-reactive protein of 5. This is improved to 14 and 1.3 respectively. This would indicate improvement. ABIs in our clinic today were 1.23 on the right and 1.20 on the left 11/01/2019 on evaluation today patient appears to be doing fairly well in regard to the wounds on his feet at this point. Fortunately there is no signs of active infection at this time. No fevers, chills, nausea, vomiting, or diarrhea. He currently is seeing infectious disease and still under their care at this point. Subsequently he also has both wounds which she has not been using collagen on as he did not receive that in his packaging he did not call us and let us know that. Apparently that just was missed on the order. Nonetheless we will get that straightened out today. 8/9-Patient returns for bilateral foot wounds, using Prisma with hydrogel moistened dressings, and the wounds appear stable. Patient using surgical shoes, avoiding much pressure or weightbearing as much as possible 8/16; patient has bilateral foot wounds. 1 on the right lateral foot proximally the other is on the left mid lateral foot. Both required debridement of callus and thick skin around the wounds. We have been using silver collagen 8/27; patient has bilateral lateral foot wounds. The area on the left substantially surrounded by callus and dry skin. This  was removed from the wound edge. The underlying wound is small. The area on the right measured somewhat smaller today. 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 Unfortunately I did not update his antibiotics during today's visit. 9/10 I reviewed Dr.  Campbell's last notes he felt he had completed antibiotics his inflammatory markers were reasonably well controlled. He has a small wound on the lateral left foot and a tiny area on the right which is just above closed. He is using Hydrofera Blue with border foam he has bilateral surgical shoes Objective Constitutional Patient is hypertensive.. Pulse regular and within target range for patient.Marland Kitchen Respirations regular, non-labored and within target range.. Temperature is normal and within the target range for the patient.Marland Kitchen Appears in no distress. Vitals Time Taken: 4:04 PM, Height: 77 in, Weight: 280 lbs, BMI: 33.2, Temperature: 98.4 F, Pulse: 91 bpm, Respiratory Rate: 18 breaths/min, Blood Pressure: 144/87 mmHg. Cardiovascular Pedal pulses are palpable. General Notes: Wound exam; left lateral foot and the right lateral foot. The area on the right which is on the fifth met head lateral aspect is just about closed. The area on the base of the left fifth metatarsal dry but very superficial. Integumentary (Hair, Skin) Wound #2 status is Open. Original cause of wound was Gradually Appeared. The wound is located on the Right,Lateral Foot. The wound measures 0.9cm length x 0.3cm width x 0.1cm depth; 0.212cm^2 area and 0.021cm^3 volume. There is Fat Layer (Subcutaneous Tissue) exposed. There is no tunneling or undermining noted. There is a medium amount of serosanguineous drainage noted. The wound margin is flat and intact. There is large (67-100%) red granulation within the wound bed. There is a small (1-33%) amount of necrotic tissue within the wound bed including Adherent Slough. Wound  #3 status is Open. Original cause of wound was Trauma. The wound is located on the Left,Lateral Foot. The wound measures 0.1cm length x 0.1cm width x 0.1cm depth; 0.008cm^2 area and 0.001cm^3 volume. There is Fat Layer (Subcutaneous Tissue) exposed. There is no tunneling or undermining noted. There is a medium amount of serosanguineous drainage noted. The wound margin is flat and intact. There is large (67-100%) pink granulation within the wound bed. There is no necrotic tissue within the wound bed. 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 Dressing Change Frequency: Wound #2 Right,Lateral Foot: Change Dressing every other day. Wound #3 Left,Lateral Foot: Change Dressing every other day. Skin Barriers/Peri-Wound Care: Moisturizing lotion - both legs and feet Wound Cleansing: May shower and wash wound with soap and water. - on days that dressing is changed Primary Wound Dressing: Wound #2 Right,Lateral Foot: Hydrofera Blue Wound #3 Left,Lateral Foot: Hydrofera Blue Secondary Dressing: Wound #2 Right,Lateral Foot: Foam - foam donut Kerlix/Rolled Gauze Dry Gauze Wound #3 Left,Lateral Foot: Foam - foam donut Kerlix/Rolled Gauze Dry Gauze Off-Loading: Open toe surgical shoe to: - both feet 1. I continued with Hydrofera Blue bordered foam and his surgical shoes 2. These look like they are progressing towards closure although I wonder about the offloading. 3. Weight gain. The cause of this was not obvious a quick assessment at the bedside he does not have pitting edema. His  last lab work at the end of July showed a normal BUN and creatinine. He follows with Dr. Buddy Duty for his diabetes. 4. Follow-up in 2 Electronic Signature(s) Signed: 12/16/2019 7:26:40 PM By: [name] MD Entered By: [name] on 12/13/2019 17:11:10 --------------------------------------------------------------------------------  SuperBill Details Patient Name: Date of Service: [name] 12/13/2019 Medical Record Number: [id number] Patient Account Number: [id number] Date of Birth/Sex: Treating RN: Mar 15, 1986 (34 y.o. Marvis Repress Primary Care Provider: [name] Other Clinician: Referring Provider: Treating Provider/Extender: Delton See in Treatment: 7 Diagnosis Coding ICD-10 Codes Code Description E11.621 Type 2 diabetes mellitus with foot ulcer L97.518 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ity L97.528 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ity 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, right ankle and foot B95.62 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 classified elsewhere Facility Procedures CPT4 Code: 59923414 Description: 99214 - WOUND CARE VISIT-LEV 4 EST PT Modifier: Quantity: 1 Physician Procedures : CPT4 Code Description Modifier [id number] - WC PHYS LEVEL 3 - EST PT ICD-10 Diagnosis Description L97.518 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ity L97.528 Non-pressure chronic ulcer of other part of left  foot with other specified severity E11.621 Type 2 diabetes mellitus with foot ulcer Quantity: 1 Electronic Signature(s) Signed: 12/16/2019 7:26:40 PM By: [name] MD Previous Signature: 12/13/2019 4:40:23 PM Version By: Kela Millin Entered By: [name] on 12/13/2019 17:11:33

## 2019-12-18 ENCOUNTER — Telehealth: Payer: Self-pay | Admitting: Gastroenterology

## 2019-12-18 ENCOUNTER — Other Ambulatory Visit: Payer: Self-pay

## 2019-12-18 DIAGNOSIS — D649 Anemia, unspecified: Secondary | ICD-10-CM

## 2019-12-18 NOTE — Telephone Encounter (Signed)
Spoke with patient regarding lab results, see 9/10 result note for more information

## 2019-12-18 NOTE — Telephone Encounter (Signed)
Lm on vm for patient to return call 

## 2019-12-20 ENCOUNTER — Encounter (HOSPITAL_BASED_OUTPATIENT_CLINIC_OR_DEPARTMENT_OTHER): Payer: BC Managed Care – PPO | Admitting: Internal Medicine

## 2019-12-24 ENCOUNTER — Telehealth: Payer: Self-pay | Admitting: Nutrition

## 2019-12-24 NOTE — Telephone Encounter (Signed)
Message left on machine to call me to set up an appointment to discuss insulin pump therapy.

## 2019-12-27 ENCOUNTER — Other Ambulatory Visit: Payer: Self-pay

## 2019-12-27 ENCOUNTER — Encounter (HOSPITAL_BASED_OUTPATIENT_CLINIC_OR_DEPARTMENT_OTHER): Payer: BC Managed Care – PPO | Admitting: Internal Medicine

## 2019-12-27 DIAGNOSIS — E11621 Type 2 diabetes mellitus with foot ulcer: Secondary | ICD-10-CM | POA: Diagnosis not present

## 2019-12-27 NOTE — Progress Notes (Addendum)
[name], [name] ([id number]) Visit Report for 12/27/2019 Arrival Information Details Patient Name: Date of Service: [name] RMSTRO NG, [name] 12/27/2019 3:45 PM Medical Record Number: [id number] Patient Account Number: [id number] Date of Birth/Sex: Treating RN: [birth year] (34 y.o. [name]) Boehlein, Linda Primary Care Sadeel Fiddler: Katy ApoPolite, Ronald D Other Clinician: Referring Terrionna Bridwell: Treating Dakarri Kessinger/Extender: Demetria Poreobson, Michael [name], Ronald D Weeks in Treatment: 9 Visit Information History Since Last Visit Added or deleted any medications: No Patient Arrived: Ambulatory Any new allergies or adverse reactions: No Arrival Time: 15:54 Had a fall or experienced change in No Accompanied By: self activities of daily living that may affect Transfer Assistance: None risk of falls: Patient Identification Verified: Yes Signs or symptoms of abuse/neglect since No Secondary Verification Process Completed: Yes last visito Patient Requires Transmission-Based Precautions: No Hospitalized since last visit: No Patient Has Alerts: No Implantable device outside of the clinic No excluding cellular tissue based products placed in the center since last visit: Has Dressing in Place as Prescribed: Yes Has Footwear/Offloading in Place as Yes Prescribed: Left: Surgical Shoe with Pressure Relief Insole Right: Surgical Shoe with Pressure Relief Insole Pain Present Now: No Electronic Signature(s) Signed: 12/27/2019 4:39:15 PM By: Zenaida DeedBoehlein, Linda RN, BSN Entered By: Zenaida DeedBoehlein, Linda on 12/27/2019 16:02:29 -------------------------------------------------------------------------------- Encounter Discharge Information Details Patient Name: Date of Service: Max [name], [name] 12/27/2019 3:45 PM Medical Record Number: [id number] Patient Account Number: [id number] Date of Birth/Sex: Treating RN: [birth year] (34 y.o. [name]) Boehlein, Linda Primary Care Osher Oettinger: Katy ApoPolite, Ronald D Other Clinician: Referring Arielys Wandersee: Treating  Ayeshia Coppin/Extender: Demetria Poreobson, Michael [name], Ronald D Weeks in Treatment: 9 Encounter Discharge Information Items Post Procedure Vitals Discharge Condition: Stable Temperature (F): 98.5 Ambulatory Status: Ambulatory Pulse (bpm): 84 Discharge Destination: Home Respiratory Rate (breaths/min): 18 Transportation: Private Auto Blood Pressure (mmHg): 145/95 Accompanied By: self Schedule Follow-up Appointment: Yes Clinical Summary of Care: Patient Declined Electronic Signature(s) Signed: 12/31/2019 5:46:00 PM By: Zenaida DeedBoehlein, Linda RN, BSN Entered By: Zenaida DeedBoehlein, Linda on 12/27/2019 17:12:18 -------------------------------------------------------------------------------- Lower Extremity Assessment Details Patient Name: Date of Service: [name] RMSTRO NG, [name] 12/27/2019 3:45 PM Medical Record Number: [id number] Patient Account Number: [id number] Date of Birth/Sex: Treating RN: [birth year] (34 y.o. [name]) Boehlein, Linda Primary Care Johara Lodwick: Katy ApoPolite, Ronald D Other Clinician: Referring Veva Grimley: Treating Shaneta Cervenka/Extender: Myna Brightobson, Michael [name], Ronald D Weeks in Treatment: 9 Edema Assessment Assessed: [name] Edema: [Left: No] [Right: No] Calf Left: Right: Point of Measurement: 48 cm From Medial Instep 48 cm 48 cm Ankle Left: Right: Point of Measurement: 10 cm From Medial Instep 30 cm 31 cm Vascular Assessment Pulses: Dorsalis Pedis Palpable: [Left:Yes] [Right:Yes] Electronic Signature(s) Signed: 12/27/2019 4:39:15 PM By: Zenaida DeedBoehlein, Linda RN, BSN Entered By: Zenaida DeedBoehlein, Linda on 12/27/2019 16:03:18 -------------------------------------------------------------------------------- Multi Wound Chart Details Patient Name: Date of Service: Max [name], [name] 12/27/2019 3:45 PM Medical Record Number: [id number] Patient Account Number: [id number] Date of Birth/Sex: Treating RN: [birth year] (34 y.o. [name]) Dwiggins, Shannon Primary Care Cambrie Sonnenfeld: Katy ApoPolite, Ronald D Other  Clinician: Referring Kinda Pottle: Treating Ronya Gilcrest/Extender: Demetria Poreobson, Michael [name], Ronald D Weeks in Treatment: 9 Vital Signs Height(in): 77 Pulse(bpm): 84 Weight(lbs): 280 Blood Pressure(mmHg): 145/95 Body Mass Index(BMI): 33 Temperature(F): 98.5 Respiratory Rate(breaths/min): 18 Photos: [2:No Photos Right, Lateral Foot] [3:No Photos Left, Lateral Foot] [N/A:N/A N/A] Wound Location: [2:Gradually Appeared] [3:Trauma] [N/A:N/A] Wounding Event: [2:Diabetic Wound/Ulcer of the Lower] [3:Diabetic Wound/Ulcer of the Lower] [N/A:N/A] Primary Etiology: [2:Extremity Type II Diabetes] [3:Extremity Type II Diabetes] [N/A:N/A] Comorbid History: [2:09/03/2019] [3:10/02/2019] [N/A:N/A] Date Acquired: [2:9] [3:9] [N/A:N/A] Weeks of Treatment: [2:Healed - Epithelialized] [  3:Open] [N/A:N/A] Wound Status: [2:0x0x0] [3:0.4x0.3x0.5] [N/A:N/A] Measurements L x W x D (cm) [2:0] [3:0.094] [N/A:N/A] A (cm) : rea [2:0] [3:0.047] [N/A:N/A] Volume (cm) : [2:100.00%] [3:94.30%] [N/A:N/A] % Reduction in A rea: [2:100.00%] [3:71.50%] [N/A:N/A] % Reduction in Volume: [3:6] Starting Position 1 (o'clock): [3:9] Ending Position 1 (o'clock): [3:1.2] Maximum Distance 1 (cm): [2:No] [3:Yes] [N/A:N/A] Undermining: [2:Grade 2] [3:Grade 2] [N/A:N/A] Classification: [2:None Present] [3:Small] [N/A:N/A] Exudate A mount: [2:N/A] [3:Serosanguineous] [N/A:N/A] Exudate Type: [2:N/A] [3:red, brown] [N/A:N/A] Exudate Color: [2:Flat and Intact] [3:Flat and Intact] [N/A:N/A] Wound Margin: [2:None Present (0%)] [3:Large (67-100%)] [N/A:N/A] Granulation A mount: [2:N/A] [3:Pink] [N/A:N/A] Granulation Quality: [2:None Present (0%)] [3:None Present (0%)] [N/A:N/A] Necrotic A mount: [2:Fascia: No] [3:Fat Layer (Subcutaneous Tissue): Yes N/A] Exposed Structures: [2:Fat Layer (Subcutaneous Tissue): No Fascia: No Tendon: No Muscle: No Joint: No Bone: No Large (67-100%)] [3:Tendon: No Muscle: No Joint: No Bone: No Small (1-33%)]  [N/A:N/A] Epithelialization: [2:N/A] [3:Debridement - Excisional] [N/A:N/A] Debridement: Pre-procedure Verification/Time Out N/A [3:16:52] [N/A:N/A] Taken: [2:N/A] [3:Other] [N/A:N/A] Pain Control: [2:N/A] [3:Callus, Subcutaneous] [N/A:N/A] Tissue Debrided: [2:N/A] [3:Skin/Subcutaneous Tissue] [N/A:N/A] Level: [2:N/A] [3:0.12] [N/A:N/A] Debridement A (sq cm): [2:rea N/A] [3:Blade, Forceps] [N/A:N/A] Instrument: [2:N/A] [3:Moderate] [N/A:N/A] Bleeding: [2:N/A] [3:Silver Nitrate] [N/A:N/A] Hemostasis A chieved: [2:N/A] [3:0] [N/A:N/A] Procedural Pain: [2:N/A] [3:0] [N/A:N/A] Post Procedural Pain: [2:N/A] [3:Procedure was tolerated well] [N/A:N/A] Debridement Treatment Response: [2:N/A] [3:1x1.5x0.5] [N/A:N/A] Post Debridement Measurements L x W x D (cm) [2:N/A] [3:0.589] [N/A:N/A] Post Debridement Volume: (cm) [2:N/A] [3:Debridement] [N/A:N/A] Treatment Notes Wound #3 (Left, Lateral Foot) 3. Primary Dressing Applied Hydrofera Blue 4. Secondary Dressing Dry Gauze Roll Gauze Foam 7. Footwear/Offloading device applied Surgical shoe Electronic Signature(s) Signed: 12/29/2019 6:30:39 AM By: Baltazar Najjar MD Signed: 12/30/2019 5:24:24 PM By: Cherylin Mylar Entered By: Baltazar Najjar on 12/29/2019 06:19:18 -------------------------------------------------------------------------------- Multi-Disciplinary Care Plan Details Patient Name: Date of Service: [name], [name] 12/27/2019 3:45 PM Medical Record Number: [id number] Patient Account Number: [id number] Date of Birth/Sex: Treating RN: [birth year] (34 y.o. Max Right Primary Care Camdyn Laden: Katy Apo Other Clinician: Referring Jemaine Prokop: Treating Zakry Caso/Extender: Demetria Pore in Treatment: 9 Active Inactive Nutrition Nursing Diagnoses: Imbalanced nutrition Potential for alteratiion in Nutrition/Potential for imbalanced nutrition Goals: Patient/caregiver agrees to and  verbalizes understanding of need to use nutritional supplements and/or vitamins as prescribed Date Initiated: 10/24/2019 Target Resolution Date: 01/24/2020 Goal Status: Active Patient/caregiver will maintain therapeutic glucose control Date Initiated: 10/24/2019 Target Resolution Date: 01/24/2020 Goal Status: Active Interventions: Assess HgA1c results as ordered upon admission and as needed Assess patient nutrition upon admission and as needed per policy Provide education on elevated blood sugars and impact on wound healing Provide education on nutrition Treatment Activities: Education provided on Nutrition : 12/13/2019 Notes: Wound/Skin Impairment Nursing Diagnoses: Impaired tissue integrity Knowledge deficit related to ulceration/compromised skin integrity Goals: Patient/caregiver will verbalize understanding of skin care regimen Date Initiated: 10/24/2019 Target Resolution Date: 01/10/2020 Goal Status: Active Ulcer/skin breakdown will have a volume reduction of 30% by week 4 Date Initiated: 10/24/2019 Target Resolution Date: 01/10/2020 Goal Status: Active Interventions: Assess patient/caregiver ability to obtain necessary supplies Assess patient/caregiver ability to perform ulcer/skin care regimen upon admission and as needed Assess ulceration(s) every visit Provide education on ulcer and skin care Notes: Electronic Signature(s) Signed: 12/27/2019 5:26:06 PM By: Cherylin Mylar Entered By: Cherylin Mylar on 12/27/2019 16:48:35 -------------------------------------------------------------------------------- Pain Assessment Details Patient Name: Date of Service: [name] D 12/27/2019 3:45 PM Medical Record Number: [id number] Patient Account Number: [id number] Date of Birth/Sex: Treating RN:  1985/09/26 (34 y.o. Max Schooner Primary Care Avary Eichenberger: Katy Apo Other Clinician: Referring Jatorian Renault: Treating Tamee Battin/Extender: Demetria Pore in Treatment: 9 Active Problems Location of Pain Severity and Description of Pain Patient Has Paino No Site Locations Rate the pain. Current Pain Level: 0 Pain Management and Medication Current Pain Management: Electronic Signature(s) Signed: 12/27/2019 4:39:15 PM By: Zenaida Deed RN, BSN Entered By: Zenaida Deed on 12/27/2019 16:03:05 -------------------------------------------------------------------------------- Patient/Caregiver Education Details Patient Name: Date of Service: [name] D 9/24/2021andnbsp3:45 PM Medical Record Number: [id number] Patient Account Number: [id number] Date of Birth/Gender: Treating RN: 17-Sep-1985 (34 y.o. Max Right Primary Care Physician: Katy Apo Other Clinician: Referring Physician: Treating Physician/Extender: Demetria Pore in Treatment: 9 Education Assessment Education Provided To: Patient Education Topics Provided Wound/Skin Impairment: Handouts: Caring for Your Ulcer Methods: Explain/Verbal Responses: State content correctly Electronic Signature(s) Signed: 12/27/2019 5:26:06 PM By: Cherylin Mylar Entered By: Cherylin Mylar on 12/27/2019 16:48:47 -------------------------------------------------------------------------------- Wound Assessment Details Patient Name: Date of Service: [name] D 12/27/2019 3:45 PM Medical Record Number: [id number] Patient Account Number: [id number] Date of Birth/Sex: Treating RN: 07-24-1985 (34 y.o. Max Schooner Primary Care Habib Kise: Katy Apo Other Clinician: Referring Channing Savich: Treating Esteen Delpriore/Extender: Demetria Pore in Treatment: 9 Wound Status Wound Number: 2 Primary Etiology: Diabetic Wound/Ulcer of the Lower Extremity Wound Location: Right, Lateral Foot Wound Status: Healed - Epithelialized Wounding Event: Gradually Appeared Comorbid History: Type II Diabetes Date  Acquired: 09/03/2019 Weeks Of Treatment: 9 Clustered Wound: No Photos Photo Uploaded By: [name] on 12/31/2019 11:18:31 Wound Measurements Length: (cm) Width: (cm) Depth: (cm) Area: (cm) Volume: (cm) 0 % Reduction in Area: 100% 0 % Reduction in Volume: 100% 0 Epithelialization: Large (67-100%) 0 Tunneling: No 0 Undermining: No Wound Description Classification: Grade 2 Wound Margin: Flat and Intact Exudate Amount: None Present Foul Odor After Cleansing: No Slough/Fibrino No Wound Bed Granulation Amount: None Present (0%) Exposed Structure Necrotic Amount: None Present (0%) Fascia Exposed: No Fat Layer (Subcutaneous Tissue) Exposed: No Tendon Exposed: No Muscle Exposed: No Joint Exposed: No Bone Exposed: No Electronic Signature(s) Signed: 12/27/2019 5:26:06 PM By: Cherylin Mylar Signed: 12/31/2019 5:46:00 PM By: Zenaida Deed RN, BSN Previous Signature: 12/27/2019 4:39:15 PM Version By: Zenaida Deed RN, BSN Entered By: Cherylin Mylar on 12/27/2019 16:50:18 -------------------------------------------------------------------------------- Wound Assessment Details Patient Name: Date of Service: [name], [name] 12/27/2019 3:45 PM Medical Record Number: [id number] Patient Account Number: [id number] Date of Birth/Sex: Treating RN: 12-Aug-1985 (34 y.o. Max Schooner Primary Care Jera Headings: Katy Apo Other Clinician: Referring Linkin Vizzini: Treating Gifford Ballon/Extender: Myna Bright Weeks in Treatment: 9 Wound Status Wound Number: 3 Primary Etiology: Diabetic Wound/Ulcer of the Lower Extremity Wound Location: Left, Lateral Foot Wound Status: Open Wounding Event: Trauma Comorbid History: Type II Diabetes Date Acquired: 10/02/2019 Weeks Of Treatment: 9 Clustered Wound: No Photos Photo Uploaded By: [name] on 12/31/2019 11:18:32 Wound Measurements Length: (cm) 0.4 Width: (cm) 0.3 Depth: (cm) 0.5 Area: (cm)  0.094 Volume: (cm) 0.047 % Reduction in Area: 94.3% % Reduction in Volume: 71.5% Epithelialization: Small (1-33%) Tunneling: No Undermining: Yes Starting Position (o'clock): 6 Ending Position (o'clock): 9 Maximum Distance: (cm) 1.2 Wound Description Classification: Grade 2 Wound Margin: Flat and Intact Exudate Amount: Small Exudate Type: Serosanguineous Exudate Color: red, brown Foul Odor After Cleansing: No Slough/Fibrino No Wound Bed Granulation Amount: Large (67-100%) Exposed Structure Granulation Quality: Pink Fascia Exposed: No Necrotic Amount:  None Present (0%) Fat Layer (Subcutaneous Tissue) Exposed: Yes Tendon Exposed: No Muscle Exposed: No Joint Exposed: No Bone Exposed: No Treatment Notes Wound #3 (Left, Lateral Foot) 3. Primary Dressing Applied Hydrofera Blue 4. Secondary Dressing Dry Gauze Roll Gauze Foam 7. Footwear/Offloading device applied Surgical shoe Electronic Signature(s) Signed: 12/27/2019 4:39:15 PM By: Zenaida Deed RN, BSN Entered By: Zenaida Deed on 12/27/2019 16:05:35 -------------------------------------------------------------------------------- Vitals Details Patient Name: Date of Service: [name], [name] 12/27/2019 3:45 PM Medical Record Number: [id number] Patient Account Number: [id number] Date of Birth/Sex: Treating RN: 04-23-1985 (34 y.o. Max Schooner Primary Care Jadine Brumley: Katy Apo Other Clinician: Referring Henya Aguallo: Treating Torren Maffeo/Extender: Demetria Pore in Treatment: 9 Vital Signs Time Taken: 16:02 Temperature (F): 98.5 Height (in): 77 Pulse (bpm): 84 Source: Stated Respiratory Rate (breaths/min): 18 Weight (lbs): 280 Blood Pressure (mmHg): 145/95 Source: Stated Reference Range: 80 - 120 mg / dl Body Mass Index (BMI): 33.2 Electronic Signature(s) Signed: 12/27/2019 4:39:15 PM By: Zenaida Deed RN, BSN Entered By: Zenaida Deed on 12/27/2019 16:02:57

## 2019-12-30 NOTE — Progress Notes (Signed)
[name] ([id number]) Visit Report for 12/27/2019 Debridement Details Patient Name: Date of Service: [name] 12/27/2019 3:45 PM Medical Record Number: [id number] Patient Account Number: [id number] Date of Birth/Sex: Treating RN: 24-Jun-1985 (34 y.o. [name] Primary Care Provider: [name] Other Clinician: Referring Provider: Treating Provider/Extender: Delton See in Treatment: 9 Debridement Performed for Assessment: Wound #3 Left,Lateral Foot Performed By: Physician [name], MD Debridement Type: Debridement Severity of Tissue Pre Debridement: Fat layer exposed Level of Consciousness (Pre-procedure): Awake and Alert Pre-procedure Verification/Time Out Yes - 16:52 Taken: Start Time: 16:52 Pain Control: Other : benzocaine, 20% T Area Debrided (L x W): otal 0.4 (cm) x 0.3 (cm) = 0.12 (cm) Tissue and other material debrided: Viable, Non-Viable, Callus, Subcutaneous Level: Skin/Subcutaneous Tissue Debridement Description: Excisional Instrument: Blade, Forceps Bleeding: Moderate Hemostasis Achieved: Silver Nitrate End Time: 16:53 Procedural Pain: 0 Post Procedural Pain: 0 Response to Treatment: Procedure was tolerated well Level of Consciousness (Post- Awake and Alert procedure): Post Debridement Measurements of Total Wound Length: (cm) 1 Width: (cm) 1.5 Depth: (cm) 0.5 Volume: (cm) 0.589 Character of Wound/Ulcer Post Debridement: Improved Severity of Tissue Post Debridement: Fat layer exposed Post Procedure Diagnosis Same as Pre-procedure Electronic Signature(s) Signed: 12/29/2019 6:30:39 AM By: [name] MD Signed: 12/30/2019 5:24:24 PM By: Kela Millin Previous Signature: 12/27/2019 5:26:06 PM Version By: Kela Millin Entered By: [name] on 12/29/2019 06:19:43 -------------------------------------------------------------------------------- HPI Details Patient Name: Date of  Service: [name], [name] 12/27/2019 3:45 PM Medical Record Number: [id number] Patient Account Number: [id number] Date of Birth/Sex: Treating RN: 1985/07/25 (34 y.o. 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 He comes in for review of Max wound over the plantar fifth metatarsal head extending into the lateral part of the foot. He was followed for this previously by his podiatrist Dr. Cornelius Moras. As the patient tells his story he went to see podiatry first for Max swelling he developed on the lateral part of his fifth metatarsal head in May. He states this was "open" by podiatry and the area closed. He was followed up in June and it was again opened callus removed and it closed promptly. There were plans being made for surgery on the fifth metatarsal head in June however his blood sugar was apparently too high for anesthesia. Apparently the area was debrided and opened again in June and it is never closed since. Looking over the records from podiatry I am really not able to follow this. It was clear when he was first seen it was before 5/14 at that point he already had Max wound. By 5/17 the ulcer was resolved. I do not see anything about Max procedure. On 5/28 noted to have pre-ulcerative moderate keratosis. X-ray noted 1/5 contracted toe and tailor's bunion and metatarsal deformity. On Max visit date on 09/28/2018 the dorsal part of the left foot it healed and resolved. There was concern about swelling in his lower extremity he was sent to the ER.. As far as I can tell he was seen in the ER on 7/12 with an ulcer on his left foot. Max DVT rule out of the left leg was negative. I do not think I have complete records from podiatry  but I am not  able to verify the procedures this patient states he had. He states after the last procedure the wound has never closed although I am not able to follow this in the records I have from podiatry. He has not had Max recent x-ray The patient has been using Neosporin on the wound. He is wearing Max Darco shoe. He is still very active up on his foot working and exercising. Past medical history; type 2 diabetes ketosis-prone, leg swelling with Max negative DVT study in July. Non-smoker ABI in our clinic was 0.85 on the left 10/16; substantial wound on the plantar left fifth met head extending laterally almost to the dorsal fifth MTP. We have been using silver alginate we gave him Max Darco forefoot off loader. An x-ray did not show evidence of osteomyelitis did note soft tissue emphysema which I think was due to gas tracking through an open wound. There is no doubt in my mind he requires an MRI 10/23; MRI not booked until 3 November at the earliest this is largely due to his glucose sensor in the right arm. We have been using silver alginate. There has been an improvement 10/29; I am still not exactly sure when his MRI is booked for. He says it is the third but it is the 10th in epic. This definitely needs to be done. He is running Max low-grade fever today but no other symptoms. No real improvement in the 1 02/26/2019 patient presents today for Max follow-up visit here in our clinic he is last been seen in the clinic on October 29. Subsequently we were working on getting MRI to evaluate and see what exactly was going on and where we would need to go from the standpoint of whether or not he had osteomyelitis and again what treatments were going be required. Subsequently the patient ended up being admitted to the hospital on 02/07/2019 and was discharged on 02/14/2019. This is Max somewhat interesting admission with Max discharge diagnosis of pneumonia due to COVID-19 although he was positive for COVID-19  when tested at the urgent care but negative x2 when he was actually in the hospital. With that being said he did have acute respiratory failure with hypoxia and it was noted he also have Max left foot ulceration with osteomyelitis. With that being said he did require oxygen for his pneumonia and I level 4 L. He was placed on antivirals and steroids for the COVID-19. He was also transferred to the [facility] at one point. Nonetheless he did subsequently discharged home and since being home has done much better in that regard. The CT angiogram did not show any pulmonary embolism. With regard to the osteomyelitis the patient was placed on vancomycin and Zosyn while in the hospital but has been changed to Augmentin at discharge. It was also recommended that he follow- up with wound care and podiatry. Podiatry however wanted him to see [name] according to the patient prior to them doing anything further. His hemoglobin A1c was 9.9 as noted in the hospital. Have an MRI of the left foot performed while in the hospital on 02/04/2019. 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 There is an overlying plantar open wound noted an abscess tracking back along the lateral aspect of the fifth metatarsal shaft. There is otherwise diffuse cellulitis and mild fasciitis without findings of polymyositis. The patient did have recently pneumonia secondary to COVID-19 I looked in the  chart through epic and it does appear that the patient may need to have an additional x-ray just to ensure everything is cleared and that he has no airspace disease prior to putting him into the [name]. 03/05/2019; patient was readmitted to the clinic last week. He was hospitalized twice for Max viral upper respiratory tract infection from 11/1 through 11/4 and then 11/5 through 11/12 ultimately this turned out to be Covid pneumonitis. Although he was  discharged on oxygen he is not using it. He says he feels fine. He has no exercise limitation no cough no sputum. His O2 sat in our clinic today was 100% on room air. 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 He received Vanco and Zosyn in the hospital and then was discharged on 2 weeks of Augmentin. I do not see any relevant cultures. He was supposed to follow-up with infectious disease but I do not see that he has an appointment. 12/8; patient saw Dr. Novella Olive of infectious disease last week. He felt that he had had adequate antibiotic therapy. He did not go to follow-up with Dr. Amalia Hailey of podiatry and I have again talked to him about the pros and cons of this. He does not want to consider Max ray amputation of this time. He is aware of the risks of recurrence, migration etc. He started HBO today and tolerated this well. He can complete the Augmentin that I gave him last week. I have looked over the lab work that Dr. Chana Bode ordered his C-reactive protein was 3.3 and his sedimentation rate was 17. The C-reactive protein is never really been measurably that high in this patient 12/15; not much change in the wound today however he has undermining along the lateral part of the foot again more extensively than last week. He has some rims of epithelialization. We have been using silver alginate. He is undergoing hyperbarics but did not dive today 12/18; in for his obligatory first total contact cast change. Unfortunately there was pus coming from the undermining area around his fifth metatarsal head. This was cultured but will preclude reapplication of Max cast. He is seen in conjunction with HBO 12/24; patient had staph lugdunensis in the wound in the undermining area laterally last time. We put him on doxycycline which should have covered this. The wound looks better today. I am going to give  him another week of doxycycline before reattempting the total contact cast 12/31; the patient is completing antibiotics. Hemorrhagic debris in the distal part of the wound with some undermining distally. He also had hyper granulation. Extensive debridement with Max #5 curette. The infected area that was on the lateral part of the fifth met head is closed over. I do not think he needs any more antibiotics. Patient was seen prior to HBO. Preparations for Max total contact cast were made in the cast will be placed post hyperbarics 04/11/19; once again the patient arrives today without complaint. He had been in Max cast all week noted that he had heavy drainage this week. This resulted in large raised areas of macerated tissue around the wound 1/14; wound bed looks better slightly smaller. Hydrofera Blue has been changing himself. He had Max heavy drainage last week which caused Max lot of maceration around the wound so I took him out of Max total contact cast he says the drainage is actually better this week He is seen  today in conjunction with HBO 1/21; returns to clinic. He was up in [place] for Max day or 2 attending Max funeral. He comes back in with the wound larger and with Max large area of exposed bone. He had osteomyelitis and septic arthritis of the fifth left metatarsal head while he was in hospital. He received IV antibiotics in the hospital for Max prolonged period of time then 3 weeks of Augmentin. Subsequently I gave him 2 weeks of doxycycline for more superficial wound infection. When I saw this last week the wound was smaller the surface of the wound looks satisfactory. 1/28; patient missed hyperbarics today. Bone biopsy I did last time showed Enterococcus faecalis and Staphylococcus lugdunensis . He has Max wide area of exposed bone. We are going to use silver alginate as of today. I had another ethical discussion with the patient. 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  In this situation I think the likelihood of healing this is low. Therefore I have recommended Max ray amputation and with the patient's agreement I have referred him to Dr. Doran Durand. The other issue is that his compliance with hyperbarics has been minimal because of his work schedule and given his underlying decision I am going to stop this today READMISSION 10/24/2019 MRI 09/29/2019 left foot IMPRESSION: 1. Apparent skin ulceration inferior and lateral to the 5th metatarsal base with underlying heterogeneous T2 signal and enhancement in the subcutaneous fat. Small peripherally enhancing fluid collections along the plantar and lateral aspects of the 5th metatarsal base suspicious for abscesses. 2. Interval amputation through the mid 5th metatarsal with nonspecific low-level marrow edema and enhancement. Given the proximity to the adjacent soft tissue inflammatory changes, osteomyelitis cannot be excluded. 3. The additional bones appear unremarkable. MRI 09/29/2019 right foot IMPRESSION: 1. Soft tissue ulceration lateral to the 5th MTP joint. There is low-level T2 hyperintensity within the 4th and 5th metatarsal heads and adjacent proximal phalanges without abnormal T1 signal or cortical destruction. These findings are nonspecific and could be seen with early marrow edema, hyperemia or early osteomyelitis. No evidence of septic joint. 2. Mild tenosynovitis and synovial enhancement associated with the extensor digitorum tendons at the level of the midfoot. 3. Diffuse low-level muscular T2 hyperintensity and enhancement, most consistent with diabetic myopathy. LEFT FOOT BONE Methicillin resistant staphylococcus aureus Staphylococcus lugdunensis MIC MIC CIPROFLOXACIN >=8 RESISTANT Resistant <=0.5 SENSI... Sensitive CLINDAMYCIN <=0.25 SENS... Sensitive >=8 RESISTANT Resistant ERYTHROMYCIN >=8 RESISTANT Resistant >=8 RESISTANT Resistant GENTAMICIN <=0.5 SENSI... Sensitive <=0.5 SENSI...  Sensitive Inducible Clindamycin NEGATIVE Sensitive NEGATIVE Sensitive OXACILLIN >=4 RESISTANT Resistant 2 SENSITIVE Sensitive RIFAMPIN <=0.5 SENSI... Sensitive <=0.5 SENSI... Sensitive TETRACYCLINE <=1 SENSITIVE Sensitive <=1 SENSITIVE Sensitive TRIMETH/SULFA <=10 SENSIT Sensitive <=10 SENSIT Sensitive ... Marland Kitchen.. VANCOMYCIN 1 SENSITIVE Sensitive <=0.5 SENSI... Sensitive Right foot bone . Component 3 wk ago Specimen Description BONE Special Requests RIGHT 4 METATARSAL SAMPLE B Gram Stain NO WBC SEEN NO ORGANISMS SEEN Culture RARE METHICILLIN RESISTANT STAPHYLOCOCCUS AUREUS NO ANAEROBES ISOLATED Performed at [facility], [place] [address] [place],  [address] Report Status 10/08/2019 FINAL Organism ID, Bacteria METHICILLIN RESISTANT STAPHYLOCOCCUS AUREUS Resulting Agency [facility] Susceptibility Methicillin resistant staphylococcus aureus MIC CIPROFLOXACIN >=8 RESISTANT Resistant CLINDAMYCIN <=0.25 SENS... Sensitive ERYTHROMYCIN >=8 RESISTANT Resistant GENTAMICIN <=0.5 SENSI... Sensitive Inducible Clindamycin NEGATIVE Sensitive OXACILLIN >=4 RESISTANT Resistant RIFAMPIN <=0.5 SENSI... Sensitive TETRACYCLINE <=1 SENSITIVE Sensitive TRIMETH/SULFA <=10 SENSIT Sensitive ... VANCOMYCIN 1 SENSITIVE Sensitive This is Max patient we had in clinic earlier this year with Max  wound over his left fifth metatarsal head. 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 Doran Durand on 05/16/2019. He seems to have developed problems with wounds on his bilateral feet in June 2021 although he may have had problems earlier than this. He was in an urgent care with Max right foot ulcer on 09/26/2019 and given Max course of  doxycycline. This was apparently after having trouble getting into see orthopedics. He was seen by podiatry on 09/28/2019 noted to have bilateral lower extremity ulcers including the left lateral fifth metatarsal base and the right subfifth met head. It was noted that had purulent drainage at that time. He required hospitalization from 6/20 through 7/2. This was because of worsening right foot wounds. He underwent bilateral operative incision and drainage and bone biopsies bilaterally. Culture results are listed above. He has been referred back to clinic by Dr. Jacqualyn Posey of podiatry. He is also followed by Dr. Megan Salon who saw him yesterday. He was discharged from hospital on Zyvox Flagyl and Levaquin and yesterday changed to doxycycline Flagyl and Levaquin. His inflammatory markers on 6/26 showed Max sedimentation rate of 129 and Max C-reactive protein of 5. This is improved to 14 and 1.3 respectively. This would indicate improvement. ABIs in our clinic today were 1.23 on the right and 1.20 on the left 11/01/2019 on evaluation today patient appears to be doing fairly well in regard to the wounds on his feet at this point. Fortunately there is no signs of active infection at this time. No fevers, chills, nausea, vomiting, or diarrhea. He currently is seeing infectious disease and still under their care at this point. Subsequently he also has both wounds which she has not been using collagen on as he did not receive that in his packaging he did not call us and let us know that. Apparently that just was missed on the order. Nonetheless we will get that straightened out today. 8/9-Patient returns for bilateral foot wounds, using Prisma with hydrogel moistened dressings, and the wounds appear stable. Patient using surgical shoes, avoiding much pressure or weightbearing as much as possible 8/16; patient has bilateral foot wounds. 1 on the right lateral foot proximally the other is on the left mid lateral foot. Both  required debridement of callus and thick skin around the wounds. We have been using silver collagen 8/27; patient has bilateral lateral foot wounds. The area on the left substantially surrounded by callus and dry skin. This was removed from the wound edge. The underlying wound is small. The area on the right measured somewhat smaller today. 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 Unfortunately I did not update his antibiotics during today's visit. 9/10 I reviewed Dr. Hale Bogus last notes he felt he had completed antibiotics his inflammatory markers were reasonably well controlled. He has Max small wound on the lateral left foot and Max tiny area on the right which is just above closed. He is using Hydrofera Blue with border foam he has bilateral surgical shoes 9/24; 2 week f/u. doing well. right foot is closed. left foot still undermined. Electronic Signature(s) Signed: 12/29/2019 6:30:39 AM By: [name] MD Entered By: [name] on 12/29/2019 06:22:19 -------------------------------------------------------------------------------- Physical Exam Details Patient Name:  Date of Service: [name] 12/27/2019 3:45 PM Medical Record Number: [id number] Patient Account Number: [id number] Date of Birth/Sex: Treating RN: 1985/12/06 (34 y.o. [name] Primary Care Provider: [name] Other Clinician: Referring Provider: Treating Provider/Extender: Delton See in Treatment: 9 Constitutional Patient is hypertensive.. Pulse regular and within target range for patient.Marland Kitchen Respirations regular, non-labored and within target range.. Temperature is normal and within the target range for the patient.Marland Kitchen Appears in no distress. Notes wound exam; Area on the right later 5th met had is closed. -area on the left lateral foot at the level of the base of the 5th metatarsal initially tine  however able to demonstrated significant undermined. Using aa 15 scalpel and pickups. the overlying skin and s/c tissue was removed. moderate bleeding controlled with silver nitrate and pressure dressing Electronic Signature(s) Signed: 12/29/2019 6:30:39 AM By: [name] MD Entered By: [name] on 12/29/2019 06:27:30 -------------------------------------------------------------------------------- Physician Orders Details Patient Name: Date of Service: [name], [name] 12/27/2019 3:45 PM Medical Record Number: [id number] Patient Account Number: [id number] Date of Birth/Sex: Treating RN: January 18, 1986 (34 y.o. [name] Primary Care Provider: [name] Other Clinician: Referring Provider: Treating Provider/Extender: Delton See in Treatment: 9 Verbal / Phone Orders: No Diagnosis Coding ICD-10 Coding Code Description E11.621 Type 2 diabetes mellitus with foot ulcer L97.518 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ity L97.528 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ity 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, right ankle and foot B95.62 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 classified elsewhere Follow-up Appointments Return Appointment in 2 weeks. Dressing Change Frequency Wound #3 Left,Lateral Foot Change Dressing every other day. Skin Barriers/Peri-Wound Care Moisturizing lotion - both legs and feet Wound Cleansing May shower and wash wound with soap and water. - on days that dressing is changed Primary Wound Dressing Wound #3 Left,Lateral Foot Hydrofera Blue Secondary Dressing Wound #3 Left,Lateral Foot Foam - foam donut. Kerlix/Rolled Gauze Dry Gauze Other: - cushion right lateral foot with foam Off-Loading Open toe surgical shoe to: - both feet Electronic Signature(s) Signed: 12/27/2019 5:26:06 PM By:  Kela Millin Signed: 12/29/2019 6:30:39 AM By: [name] MD Entered By: Kela Millin on 12/27/2019 16:51:25 -------------------------------------------------------------------------------- Problem List Details Patient Name: Date of Service: [name], [name] 12/27/2019 3:45 PM Medical Record Number: [id number] Patient Account Number: [id number] Date of Birth/Sex: Treating RN: [birth year] (34 y.o. [name] Primary Care Provider: [name] Other Clinician: Referring Provider: Treating Provider/Extender: Delton See in Treatment: 9 Active Problems ICD-10 Encounter Code Description Active Date MDM Diagnosis E11.621 Type 2 diabetes mellitus with foot ulcer 10/24/2019 No Yes L97.518 Non-pressure chronic ulcer of other part of right foot with other specified 10/24/2019 No Yes severity L97.528 Non-pressure chronic ulcer of other part of left foot with other specified 10/24/2019 No Yes severity 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, right ankle and foot 10/24/2019 No Yes B95.62 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 10/24/2019 No Yes classified elsewhere Inactive Problems ICD-10 Code Description Active Date Inactive Date 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 He comes in for review of Max wound over the plantar fifth metatarsal head extending into the lateral part of the foot. He was followed for this previously by his podiatrist Dr. Cornelius Moras. As the patient tells his story he went to see podiatry first for Max swelling he developed on the lateral part of his fifth metatarsal head in May. He states this was "open" by podiatry and the area closed. He was followed up in June and it was again opened callus removed and it closed promptly. There were plans being made for surgery on the fifth metatarsal head in June however his blood sugar was apparently too high for anesthesia. Apparently the area was debrided and opened again in June and it is never closed since. Looking over the records from podiatry I am really not able to follow this. It was clear when he was first seen it was before 5/14 at that point he already had Max wound. By 5/17 the ulcer was resolved. I do not see anything about Max procedure. On 5/28 noted to have pre-ulcerative moderate keratosis. X-ray noted 1/5 contracted toe and tailor's bunion and metatarsal deformity. On Max visit date on 09/28/2018 the dorsal part of the left foot it healed and resolved. There was concern about swelling in his lower extremity he was sent to the ER.. As far as I can tell he was seen in the ER on 7/12 with an ulcer on his left foot. Max DVT rule out of the left leg was negative. I do not think I have complete records from podiatry but I am not able to verify the procedures this patient states he had. He states after the last procedure the wound has never closed although I am not able to follow this in the records I have from podiatry. He has not had Max recent  x-ray The patient has been using Neosporin on the wound. He is wearing Max Darco shoe. He is still very active up on his foot working and exercising. Past medical history; type 2 diabetes ketosis-prone, leg swelling with Max negative DVT study in July. Non-smoker ABI in our clinic was 0.85 on the left 10/16; substantial wound on the plantar left fifth met head extending laterally almost to the dorsal fifth MTP. We have been using silver alginate we gave him Max Darco forefoot off loader. An x-ray did not show evidence of osteomyelitis did note soft tissue emphysema which I think was due to gas tracking through an open wound. There is no doubt in my mind he requires an MRI 10/23; MRI not booked until 3 November at the earliest this is largely due to his glucose sensor in the right arm. We have been using silver alginate. There has been an improvement 10/29; I am still not exactly sure when his MRI is booked for. He says it is the third but it is the 10th in epic. This definitely needs to be done. He is running Max low-grade fever today but no other symptoms. No real improvement in the 1 02/26/2019 patient presents today for Max follow-up visit here in our clinic  he is last been seen in the clinic on October 29. Subsequently we were working on getting MRI to evaluate and see what exactly was going on and where we would need to go from the standpoint of whether or not he had osteomyelitis and again what treatments were going be required. Subsequently the patient ended up being admitted to the hospital on 02/07/2019 and was discharged on 02/14/2019. This is Max somewhat interesting admission with Max discharge diagnosis of pneumonia due to COVID-19 although he was positive for COVID-19 when tested at the urgent care but negative x2 when he was actually in the hospital. With that being said he did have acute respiratory failure with hypoxia and it was noted he also have Max left foot ulceration with osteomyelitis. With  that being said he did require oxygen for his pneumonia and I level 4 L. He was placed on antivirals and steroids for the COVID-19. He was also transferred to the [facility] at one point. Nonetheless he did subsequently discharged home and since being home has done much better in that regard. The CT angiogram did not show any pulmonary embolism. With regard to the osteomyelitis the patient was placed on vancomycin and Zosyn while in the hospital but has been changed to Augmentin at discharge. It was also recommended that he follow- up with wound care and podiatry. Podiatry however wanted him to see [name] according to the patient prior to them doing anything further. His hemoglobin A1c was 9.9 as noted in the hospital. Have an MRI of the left foot performed while in the hospital on 02/04/2019. 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 There is an overlying plantar open wound noted an abscess tracking back along the lateral aspect of the fifth metatarsal shaft. There is otherwise diffuse cellulitis and mild fasciitis without findings of polymyositis. The patient did have recently pneumonia secondary to COVID-19 I looked in the chart through epic and it does appear that the patient may need to have an additional x-ray just to ensure everything is cleared and that he has no airspace disease prior to putting him into the [name]. 03/05/2019; patient was readmitted to the clinic last week. He was hospitalized twice for Max viral upper respiratory tract infection from 11/1 through 11/4 and then 11/5 through 11/12 ultimately this turned out to be Covid pneumonitis. Although he was discharged on oxygen he is not using it. He says he feels fine. He has no exercise limitation no cough no sputum. His O2 sat in our clinic today was 100% on room air. He did manage to have his MRI which showed septic arthritis at the  fifth MTP joint and osteomyelitis involving the fifth metatarsal head and proximal phalanx. He received Vanco and Zosyn in the hospital and then was discharged on 2 weeks of Augmentin. I do not see any relevant cultures. He was supposed to follow-up with infectious disease but I do not see that he has an appointment. 12/8; patient saw Dr. Novella Olive of infectious disease last week. He felt that he had had adequate antibiotic therapy. He did not go to follow-up with Dr. Amalia Hailey of podiatry and I have again talked to him about the pros and cons of this. He does not want to consider Max ray amputation of this time. He is aware of the risks of recurrence, migration etc. He started HBO today and tolerated this well. He can complete the  Augmentin that I gave him last week. I have looked over the lab work that Dr. Chana Bode ordered his C-reactive protein was 3.3 and his sedimentation rate was 17. The C-reactive protein is never really been measurably that high in this patient 12/15; not much change in the wound today however he has undermining along the lateral part of the foot again more extensively than last week. He has some rims of epithelialization. We have been using silver alginate. He is undergoing hyperbarics but did not dive today 12/18; in for his obligatory first total contact cast change. Unfortunately there was pus coming from the undermining area around his fifth metatarsal head. This was cultured but will preclude reapplication of Max cast. He is seen in conjunction with HBO 12/24; patient had staph lugdunensis in the wound in the undermining area laterally last time. We put him on doxycycline which should have covered this. The wound looks better today. I am going to give him another week of doxycycline before reattempting the total contact cast 12/31; the patient is completing antibiotics. Hemorrhagic debris in the distal part of the wound with some undermining distally. He also had hyper  granulation. Extensive debridement with Max #5 curette. The infected area that was on the lateral part of the fifth met head is closed over. I do not think he needs any more antibiotics. Patient was seen prior to HBO. Preparations for Max total contact cast were made in the cast will be placed post hyperbarics 04/11/19; once again the patient arrives today without complaint. He had been in Max cast all week noted that he had heavy drainage this week. This resulted in large raised areas of macerated tissue around the wound 1/14; wound bed looks better slightly smaller. Hydrofera Blue has been changing himself. He had Max heavy drainage last week which caused Max lot of maceration around the wound so I took him out of Max total contact cast he says the drainage is actually better this week He is seen today in conjunction with HBO 1/21; returns to clinic. He was up in [place] for Max day or 2 attending Max funeral. He comes back in with the wound larger and with Max large area of exposed bone. He had osteomyelitis and septic arthritis of the fifth left metatarsal head while he was in hospital. He received IV antibiotics in the hospital for Max prolonged period of time then 3 weeks of Augmentin. Subsequently I gave him 2 weeks of doxycycline for more superficial wound infection. When I saw this last week the wound was smaller the surface of the wound looks satisfactory. 1/28; patient missed hyperbarics today. Bone biopsy I did last time showed Enterococcus faecalis and Staphylococcus lugdunensis . He has Max wide area of exposed bone. We are going to use silver alginate as of today. I had another ethical discussion with the patient. 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 In this situation I think the likelihood of healing this is low. Therefore I have recommended Max ray amputation and with the patient's agreement I have referred him to Dr. Doran Durand. The other issue is that his compliance with  hyperbarics has been minimal because of his work schedule and given his underlying decision I am going to stop this today READMISSION 10/24/2019 MRI 09/29/2019 left foot IMPRESSION: 1. Apparent skin ulceration inferior and lateral to the 5th metatarsal base with underlying heterogeneous T2 signal and enhancement in the subcutaneous fat. Small peripherally enhancing fluid collections along the plantar and  lateral aspects of the 5th metatarsal base suspicious for abscesses. 2. Interval amputation through the mid 5th metatarsal with nonspecific low-level marrow edema and enhancement. Given the proximity to the adjacent soft tissue inflammatory changes, osteomyelitis cannot be excluded. 3. The additional bones appear unremarkable. MRI 09/29/2019 right foot IMPRESSION: 1. Soft tissue ulceration lateral to the 5th MTP joint. There is low-level T2 hyperintensity within the 4th and 5th metatarsal heads and adjacent proximal phalanges without abnormal T1 signal or cortical destruction. These findings are nonspecific and could be seen with early marrow edema, hyperemia or early osteomyelitis. No evidence of septic joint. 2. Mild tenosynovitis and synovial enhancement associated with the extensor digitorum tendons at the level of the midfoot. 3. Diffuse low-level muscular T2 hyperintensity and enhancement, most consistent with diabetic myopathy. LEFT FOOT BONE Methicillin resistant staphylococcus aureus Staphylococcus lugdunensis MIC MIC CIPROFLOXACIN >=8 RESISTANT Resistant <=0.5 SENSI... Sensitive CLINDAMYCIN <=0.25 SENS... Sensitive >=8 RESISTANT Resistant ERYTHROMYCIN >=8 RESISTANT Resistant >=8 RESISTANT Resistant GENTAMICIN <=0.5 SENSI... Sensitive <=0.5 SENSI... Sensitive Inducible Clindamycin NEGATIVE Sensitive NEGATIVE Sensitive OXACILLIN >=4 RESISTANT Resistant 2 SENSITIVE Sensitive RIFAMPIN <=0.5 SENSI... Sensitive <=0.5 SENSI... Sensitive TETRACYCLINE <=1 SENSITIVE Sensitive <=1  SENSITIVE Sensitive TRIMETH/SULFA <=10 SENSIT Sensitive <=10 SENSIT Sensitive ... Marland Kitchen.. VANCOMYCIN 1 SENSITIVE Sensitive <=0.5 SENSI... Sensitive Right foot bone . Component 3 wk ago Specimen Description BONE Special Requests RIGHT 4 METATARSAL SAMPLE B Gram Stain NO WBC SEEN NO ORGANISMS SEEN Culture RARE METHICILLIN RESISTANT STAPHYLOCOCCUS AUREUS NO ANAEROBES ISOLATED Performed at [facility], [place] [address], [place], [place] [address] Report Status 10/08/2019 FINAL Organism ID, Bacteria METHICILLIN RESISTANT STAPHYLOCOCCUS AUREUS Resulting Agency [facility] Susceptibility Methicillin resistant staphylococcus aureus MIC CIPROFLOXACIN >=8 RESISTANT Resistant CLINDAMYCIN <=0.25 SENS... Sensitive ERYTHROMYCIN >=8 RESISTANT Resistant GENTAMICIN <=0.5 SENSI... Sensitive Inducible Clindamycin NEGATIVE Sensitive OXACILLIN >=4 RESISTANT Resistant RIFAMPIN <=0.5 SENSI... Sensitive TETRACYCLINE <=1 SENSITIVE Sensitive TRIMETH/SULFA <=10 SENSIT Sensitive ... VANCOMYCIN 1 SENSITIVE Sensitive This is Max patient we had in clinic earlier this year with Max wound over his left fifth metatarsal head. 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 Doran Durand on 05/16/2019. He seems to have developed problems with wounds on his bilateral feet in June 2021 although he may have had problems earlier than this. He was in an urgent care with Max right foot ulcer on 09/26/2019 and given Max course of doxycycline. This was apparently after having trouble getting into see orthopedics. He was seen by podiatry on 09/28/2019 noted to have bilateral lower extremity ulcers including the left lateral fifth metatarsal base and the right  subfifth met head. It was noted that had purulent drainage at that time. He required hospitalization from 6/20 through 7/2. This was because of worsening right foot wounds. He underwent bilateral operative incision and drainage and bone biopsies bilaterally. Culture results are listed above. He has been referred back to clinic by Dr. Jacqualyn Posey of podiatry. He is also followed by Dr. Megan Salon who saw him yesterday. He was discharged from hospital on Zyvox Flagyl and Levaquin and yesterday changed to doxycycline Flagyl and Levaquin. His inflammatory markers on 6/26 showed Max sedimentation rate of 129 and Max C-reactive protein of 5. This is improved to 14 and 1.3 respectively. This would indicate improvement. ABIs in our clinic today  were 1.23 on the right and 1.20 on the left 11/01/2019 on evaluation today patient appears to be doing fairly well in regard to the wounds on his feet at this point. Fortunately there is no signs of active infection at this time. No fevers, chills, nausea, vomiting, or diarrhea. He currently is seeing infectious disease and still under their care at this point. Subsequently he also has both wounds which she has not been using collagen on as he did not receive that in his packaging he did not call us and let us know that. Apparently that just was missed on the order. Nonetheless we will get that straightened out today. 8/9-Patient returns for bilateral foot wounds, using Prisma with hydrogel moistened dressings, and the wounds appear stable. Patient using surgical shoes, avoiding much pressure or weightbearing as much as possible 8/16; patient has bilateral foot wounds. 1 on the right lateral foot proximally the other is on the left mid lateral foot. Both required debridement of callus and thick skin around the wounds. We have been using silver collagen 8/27; patient has bilateral lateral foot wounds. The area on the left substantially surrounded by callus and dry skin. This was  removed from the wound edge. The underlying wound is small. The area on the right measured somewhat smaller today. 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 Unfortunately I did not update his antibiotics during today's visit. 9/10 I reviewed Dr. Hale Bogus last notes he felt he had completed antibiotics his inflammatory markers were reasonably well controlled. He has Max small wound on the lateral left foot and Max tiny area on the right which is just above closed. He is using Hydrofera Blue with border foam he has bilateral surgical shoes 9/24; 2 week f/u. doing well. right foot is closed. left foot still undermined. Objective Constitutional Patient is hypertensive.. Pulse regular and within target range for patient.Marland Kitchen Respirations regular, non-labored and within target range.. Temperature is normal and within the target range for the patient.Marland Kitchen Appears in no distress. Vitals Time Taken: 4:02 PM, Height: 77 in, Source: Stated, Weight: 280 lbs, Source: Stated, BMI: 33.2, Temperature: 98.5 F, Pulse: 84 bpm, Respiratory Rate: 18 breaths/min, Blood Pressure: 145/95 mmHg. General Notes: wound exam; Area on the right later 5th met had is closed. -area on the left lateral foot at the level of the base of the 5th metatarsal initially tine however able to demonstrated significant undermined. Using aa 15 scalpel and pickups. the overlying skin and s/c tissue was removed. moderate bleeding controlled with silver nitrate and pressure dressing Integumentary (Hair, Skin) Wound #2 status is Healed - Epithelialized. Original cause of wound was Gradually Appeared. The wound is located on the Right,Lateral Foot. The wound measures 0cm length x 0cm width x 0cm depth; 0cm^2 area and 0cm^3 volume. There is no tunneling or undermining noted. There is Max none present amount of drainage noted. The wound margin is flat and intact. There is no granulation  within the wound bed. There is no necrotic tissue within the wound bed. Wound #3 status is Open. Original cause of wound was Trauma. The wound is located on the Left,Lateral Foot. The wound measures 0.4cm length x 0.3cm width x 0.5cm depth; 0.094cm^2 area and 0.047cm^3 volume. There is Fat Layer (Subcutaneous Tissue) exposed. There is no tunneling noted, however, there is undermining starting at 6:00 and ending at 9:00 with Max maximum distance of 1.2cm. There is Max small amount of serosanguineous drainage  noted. The wound margin is flat and intact. There is large (67-100%) pink granulation within the wound bed. There is no necrotic tissue within the wound bed. 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 There was Max Excisional Skin/Subcutaneous Tissue Debridement with Max total area of 0.12 sq cm performed by [name], MD. With the following instrument(s): Blade, and Forceps to remove Viable and Non-Viable tissue/material. Material removed includes Callus and Subcutaneous Tissue and after achieving pain control using Other (benzocaine, 20%). No specimens were taken. Max time out was conducted at 16:52, prior to the start of the procedure. Max Moderate amount of bleeding was controlled with Silver Nitrate. The procedure was tolerated well with Max pain level of 0 throughout and Max pain level of 0 following the procedure. Post Debridement Measurements: 1cm length  x 1.5cm width x 0.5cm depth; 0.589cm^3 volume. Character of Wound/Ulcer Post Debridement is improved. Severity of Tissue Post Debridement is: Fat layer exposed. Post procedure Diagnosis Wound #3: Same as Pre-Procedure Plan Follow-up Appointments: Return Appointment in 2 weeks. Dressing Change Frequency: Wound #3 Left,Lateral Foot: Change Dressing every other day. Skin Barriers/Peri-Wound Care: Moisturizing lotion - both legs and feet Wound Cleansing: May shower and wash wound with soap and water. - on days that dressing is changed Primary Wound Dressing: Wound #3 Left,Lateral Foot: Hydrofera Blue Secondary Dressing: Wound #3 Left,Lateral Foot: Foam - foam donut. Kerlix/Rolled Gauze Dry Gauze Other: - cushion right lateral foot with foam Off-Loading: Open toe surgical shoe to: - both feet 1 hydrofera blue 2 hopefully debridment today will allow final epithelization of this site 3 He make walk with ankle inversion. May need custom shoes at some point Electronic Signature(s) Signed: 12/29/2019 6:30:39 AM By: [name] MD Entered By: [name] on 12/29/2019 06:29:16 -------------------------------------------------------------------------------- SuperBill Details Patient Name: Date of Service: [name], [name] 12/27/2019 Medical Record Number: [id number] Patient Account Number: [id number] Date of Birth/Sex: Treating RN: Aug 18, 1985 (34 y.o. [name] Primary Care Provider: [name] Other Clinician: Referring Provider: Treating Provider/Extender: Delton See in Treatment: 9 Diagnosis Coding ICD-10 Codes Code Description E11.621 Type 2 diabetes mellitus with foot ulcer L97.518 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ity L97.528 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ity 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, right ankle and foot B95.62  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 classified elsewhere Facility Procedures CPT4 Code: [id number] 1 Description: 4782 - DEB SUBQ TISSUE 20 SQ CM/< ICD-10 Diagnosis Description L97.528 Non-pressure chronic ulcer of other part of left foot with other specified seve Modifier: rity Quantity: 1 Physician Procedures : CPT4 Code Description Modifier [id number] 11042 - WC PHYS SUBQ TISS 20 SQ CM ICD-10 Diagnosis Description L97.528 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ity Quantity: 1 Electronic Signature(s) Signed: 12/29/2019 6:30:39 AM By: [name] MD Previous Signature: 12/27/2019 5:26:06 PM Version By: Kela Millin Entered By: [name] on 12/29/2019 86:57:84

## 2019-12-31 ENCOUNTER — Encounter: Payer: Self-pay | Admitting: Nutrition

## 2019-12-31 ENCOUNTER — Encounter: Payer: BC Managed Care – PPO | Attending: Internal Medicine | Admitting: Nutrition

## 2019-12-31 ENCOUNTER — Other Ambulatory Visit: Payer: Self-pay

## 2019-12-31 DIAGNOSIS — E1065 Type 1 diabetes mellitus with hyperglycemia: Secondary | ICD-10-CM | POA: Diagnosis present

## 2019-12-31 NOTE — Patient Instructions (Signed)
Take Novolog to work.  Does not need to be kept cold.   Take Novolog before all meals as directed by Dr. Sharl Ma When you receive your pump, call me to set appointment for training.

## 2019-12-31 NOTE — Progress Notes (Signed)
Patient was identified by name and DOB.  He is here to learn about insulin pumps.  We discussed pump therapy, how they work and what is needed to be on an insulin pump: testing blood sugar QID, counting carbs, and giving meal time insulin q meal.  He has agreed to do this. We discussed carb counting, and he was given information on this to review.  He agreed to do learn this, and will download the [employer] app for times when he is going to fast food restaurants  Insulin dose:  80u Lantus QD (rarely forgets this),  Novolog with maybe 1 meal/day. CGM:  Libre Typical day: 7AM: up FBSS 110-130 with some days in the 70s and 80s.       Drinks Proein shake with powdered vegetables in them. Rarely takes insulin with this. 12PM: 6u of Novolog (less than 2Xweekly) Most times he does not take Novlog for this meal as well  Has salad with raw veg., and cheese.  Rarely eats crackers, but has banana chips. 7-9PM: 6u Novolog:  Supper of meat-4-5 ounces with 1 starcju veg., and 1-2 non starchy veg.  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 Discussed importance of taking Novolog with all meals,  Patient was shown the 3 pumps and CGM.  WE discussed advantages and disadvantages of each model.  He chose the OmniPod and paperwork was filled out and faxed to [facility] Explained the ordering process and what is needed.   HE had no final questions

## 2020-01-09 ENCOUNTER — Other Ambulatory Visit: Payer: Self-pay

## 2020-01-09 ENCOUNTER — Telehealth: Payer: BC Managed Care – PPO | Admitting: Internal Medicine

## 2020-01-10 ENCOUNTER — Encounter (HOSPITAL_BASED_OUTPATIENT_CLINIC_OR_DEPARTMENT_OTHER): Payer: BC Managed Care – PPO | Admitting: Internal Medicine

## 2020-01-14 ENCOUNTER — Encounter (HOSPITAL_BASED_OUTPATIENT_CLINIC_OR_DEPARTMENT_OTHER): Payer: BC Managed Care – PPO | Admitting: Internal Medicine

## 2020-01-16 ENCOUNTER — Encounter (HOSPITAL_BASED_OUTPATIENT_CLINIC_OR_DEPARTMENT_OTHER): Payer: BC Managed Care – PPO | Attending: Internal Medicine | Admitting: Internal Medicine

## 2020-01-16 DIAGNOSIS — E11621 Type 2 diabetes mellitus with foot ulcer: Secondary | ICD-10-CM | POA: Insufficient documentation

## 2020-01-16 DIAGNOSIS — Z8614 Personal history of Methicillin resistant Staphylococcus aureus infection: Secondary | ICD-10-CM | POA: Diagnosis not present

## 2020-01-16 DIAGNOSIS — L97528 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ity: Secondary | ICD-10-CM | POA: Diagnosis not present

## 2020-01-16 DIAGNOSIS — M86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, right ankle and foot: Secondary | ICD-10-CM | POA: Diagnosis not present

## 2020-01-16 DIAGNOSIS — L97518 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ity: Secondary | ICD-10-CM | POA: Diagnosis not present

## 2020-01-16 DIAGNOSIS — Z8616 Personal history of COVID-19: Secondary | ICD-10-CM | POA: Insufficient documentation

## 2020-01-16 NOTE — Progress Notes (Signed)
[name] ([id number]) Visit Report for 01/16/2020 Debridement Details Patient Name: Date of Service: [name] 01/16/2020 3:45 PM Medical Record Number: [id number] Patient Account Number: [id number] Date of Birth/Sex: Treating RN: 1985/11/08 (35 y.o. [name] Primary Care Provider: Seward [name] Other Clinician: Referring Provider: Treating Provider/Extender: [name] in Treatment: 12 Debridement Performed for Assessment: Wound #3 Left,Lateral Foot Performed By: Physician [name]., MD Debridement Type: Debridement Severity of Tissue Pre Debridement: Fat layer exposed Level of Consciousness (Pre-procedure): Awake and Alert Pre-procedure Verification/Time Out Yes - 16:20 Taken: Start Time: 16:21 Pain Control: Lidocaine 4% T opical Solution T Area Debrided (L x W): otal 1 (cm) x 1 (cm) = 1 (cm) Tissue and other material debrided: Viable, Non-Viable, Callus, Slough, Subcutaneous, Skin: Dermis , Skin: Epidermis, Fibrin/Exudate, Slough Level: Skin/Subcutaneous Tissue Debridement Description: Excisional Instrument: Curette Bleeding: Moderate Hemostasis Achieved: Silver Nitrate End Time: 16:29 Procedural Pain: 0 Post Procedural Pain: 0 Response to Treatment: Procedure was tolerated well Level of Consciousness (Post- Awake and Alert procedure): Post Debridement Measurements of Total Wound Length: (cm) 0.5 Width: (cm) 0.3 Depth: (cm) 0.5 Volume: (cm) 0.059 Character of Wound/Ulcer Post Debridement: Requires Further Debridement Severity of Tissue Post Debridement: Limited to breakdown of skin Post Procedure Diagnosis Same as Pre-procedure Electronic Signature(s) Signed: 01/16/2020 5:33:07 PM By: Max Ham MD Signed: 01/16/2020 5:51:00 PM By: [name] Entered By: [name] on 01/16/2020 17:06:51 -------------------------------------------------------------------------------- HPI Details Patient  Name: Date of Service: [name], [name] 01/16/2020 3:45 PM Medical Record Number: [id number] Patient Account Number: [id number] Date of Birth/Sex: Treating RN: 06-28-1985 (34 y.o. 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 He comes in for review of a wound over the plantar fifth metatarsal head extending into the lateral part of the foot. He was followed for this previously by his podiatrist Dr. Cornelius [name]. As the patient tells his story he went to see podiatry first for a swelling he developed on the lateral part of his fifth metatarsal head in May. He states this was "open" by podiatry and the area closed. He was followed up in June and it was again opened callus removed and it closed promptly. There were plans being made for surgery on the fifth metatarsal head in June however his blood sugar was apparently too high for anesthesia. Apparently the area was debrided and opened again in June and it is never closed since. Looking over the records from podiatry I am really not able to follow this. It was clear when he was first seen it was before 5/14 at that point he already had a wound. By 5/17 the ulcer was resolved. I do not see anything about a procedure. On 5/28 noted to have pre-ulcerative moderate keratosis. X-ray noted 1/5 contracted toe and tailor's bunion and metatarsal deformity. On a visit date on 09/28/2018 the dorsal part of the left foot it healed and resolved. There was concern about swelling in his lower extremity he was sent to the ER.. As far as I can tell he was seen in the ER on 7/12 with an ulcer on his left foot. A DVT rule out of the left leg was negative. I do not think I have complete records from  podiatry but I am not able  to verify the procedures this patient states he had. He states after the last procedure the wound has never closed although I am not able to follow this in the records I have from podiatry. He has not had a recent x-ray The patient has been using Neosporin on the wound. He is wearing a Darco shoe. He is still very active up on his foot working and exercising. Past medical history; type 2 diabetes ketosis-prone, leg swelling with a negative DVT study in July. Non-smoker ABI in our clinic was 0.85 on the left 10/16; substantial wound on the plantar left fifth met head extending laterally almost to the dorsal fifth MTP. We have been using silver alginate we gave him a Darco forefoot off loader. An x-ray did not show evidence of osteomyelitis did note soft tissue emphysema which I think was due to gas tracking through an open wound. There is no doubt in my mind he requires an MRI 10/23; MRI not booked until 3 November at the earliest this is largely due to his glucose sensor in the right arm. We have been using silver alginate. There has been an improvement 10/29; I am still not exactly sure when his MRI is booked for. He says it is the third but it is the 10th in epic. This definitely needs to be done. He is running a low-grade fever today but no other symptoms. No real improvement in the 1 02/26/2019 patient presents today for a follow-up visit here in our clinic he is last been seen in the clinic on October 29. Subsequently we were working on getting MRI to evaluate and see what exactly was going on and where we would need to go from the standpoint of whether or not he had osteomyelitis and again what treatments were going be required. Subsequently the patient ended up being admitted to the hospital on 02/07/2019 and was discharged on 02/14/2019. This is a somewhat interesting admission with a discharge diagnosis of pneumonia due to COVID-19 although he was positive for  COVID-19 when tested at the urgent care but negative x2 when he was actually in the hospital. With that being said he did have acute respiratory failure with hypoxia and it was noted he also have a left foot ulceration with osteomyelitis. With that being said he did require oxygen for his pneumonia and I level 4 L. He was placed on antivirals and steroids for the COVID-19. He was also transferred to the [facility] at one point. Nonetheless he did subsequently discharged home and since being home has done much better in that regard. The CT angiogram did not show any pulmonary embolism. With regard to the osteomyelitis the patient was placed on vancomycin and Zosyn while in the hospital but has been changed to Augmentin at discharge. It was also recommended that he follow- up with wound care and podiatry. Podiatry however wanted him to see [name] according to the patient prior to them doing anything further. His hemoglobin A1c was 9.9 as noted in the hospital. Have an MRI of the left foot performed while in the hospital on 02/04/2019. 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 There is an overlying plantar open wound noted an abscess tracking back along the lateral aspect of the fifth metatarsal shaft. There is otherwise diffuse cellulitis and mild fasciitis without findings of polymyositis. The patient did have recently pneumonia secondary to COVID-19 I looked in the chart  through epic and it does appear that the patient may need to have an additional x-ray just to ensure everything is cleared and that he has no airspace disease prior to putting him into the [name]. 03/05/2019; patient was readmitted to the clinic last week. He was hospitalized twice for a viral upper respiratory tract infection from 11/1 through 11/4 and then 11/5 through 11/12 ultimately this turned out to be Covid pneumonitis. Although  he was discharged on oxygen he is not using it. He says he feels fine. He has no exercise limitation no cough no sputum. His O2 sat in our clinic today was 100% on room air. 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 He received Vanco and Zosyn in the hospital and then was discharged on 2 weeks of Augmentin. I do not see any relevant cultures. He was supposed to follow-up with infectious disease but I do not see that he has an appointment. 12/8; patient saw Dr. Novella Olive of infectious disease last week. He felt that he had had adequate antibiotic therapy. He did not go to follow-up with Dr. Amalia Hailey of podiatry and I have again talked to him about the pros and cons of this. He does not want to consider a ray amputation of this time. He is aware of the risks of recurrence, migration etc. He started HBO today and tolerated this well. He can complete the Augmentin that I gave him last week. I have looked over the lab work that Dr. Chana Bode ordered his C-reactive protein was 3.3 and his sedimentation rate was 17. The C-reactive protein is never really been measurably that high in this patient 12/15; not much change in the wound today however he has undermining along the lateral part of the foot again more extensively than last week. He has some rims of epithelialization. We have been using silver alginate. He is undergoing hyperbarics but did not dive today 12/18; in for his obligatory first total contact cast change. Unfortunately there was pus coming from the undermining area around his fifth metatarsal head. This was cultured but will preclude reapplication of a cast. He is seen in conjunction with HBO 12/24; patient had staph lugdunensis in the wound in the undermining area laterally last time. We put him on doxycycline which should have covered this. The wound looks better today. I am going to  give him another week of doxycycline before reattempting the total contact cast 12/31; the patient is completing antibiotics. Hemorrhagic debris in the distal part of the wound with some undermining distally. He also had hyper granulation. Extensive debridement with a #5 curette. The infected area that was on the lateral part of the fifth met head is closed over. I do not think he needs any more antibiotics. Patient was seen prior to HBO. Preparations for a total contact cast were made in the cast will be placed post hyperbarics 04/11/19; once again the patient arrives today without complaint. He had been in a cast all week noted that he had heavy drainage this week. This resulted in large raised areas of macerated tissue around the wound 1/14; wound bed looks better slightly smaller. Hydrofera Blue has been changing himself. He had a heavy drainage last week which caused a lot of maceration around the wound so I took him out of a total contact cast he says the drainage is actually better this week He is seen today  in conjunction with HBO 1/21; returns to clinic. He was up in [place] for a day or 2 attending a funeral. He comes back in with the wound larger and with a large area of exposed bone. He had osteomyelitis and septic arthritis of the fifth left metatarsal head while he was in hospital. He received IV antibiotics in the hospital for a prolonged period of time then 3 weeks of Augmentin. Subsequently I gave him 2 weeks of doxycycline for more superficial wound infection. When I saw this last week the wound was smaller the surface of the wound looks satisfactory. 1/28; patient missed hyperbarics today. Bone biopsy I did last time showed Enterococcus faecalis and Staphylococcus lugdunensis . He has a wide area of exposed bone. We are going to use silver alginate as of today. I had another ethical discussion with the patient. This would be recurrent osteomyelitis he is already received IV  antibiotics. In this situation I think the likelihood of healing this is low. Therefore I have recommended a ray amputation and with the patient's agreement I have referred him to Dr. Doran Durand. The other issue is that his compliance with hyperbarics has been minimal because of his work schedule and given his underlying decision I am going to stop this today READMISSION 10/24/2019 MRI 09/29/2019 left foot IMPRESSION: 1. Apparent skin ulceration inferior and lateral to the 5th metatarsal base with underlying heterogeneous T2 signal and enhancement in the subcutaneous fat. Small peripherally enhancing fluid collections along the plantar and lateral aspects of the 5th metatarsal base suspicious for abscesses. 2. Interval amputation through the mid 5th metatarsal with nonspecific low-level marrow edema and enhancement. Given the proximity to the adjacent soft tissue inflammatory changes, osteomyelitis cannot be excluded. 3. The additional bones appear unremarkable. MRI 09/29/2019 right foot IMPRESSION: 1. Soft tissue ulceration lateral to the 5th MTP joint. There is low-level T2 hyperintensity within the 4th and 5th metatarsal heads and adjacent proximal phalanges without abnormal T1 signal or cortical destruction. These findings are nonspecific and could be seen with early marrow edema, hyperemia or early osteomyelitis. No evidence of septic joint. 2. Mild tenosynovitis and synovial enhancement associated with the extensor digitorum tendons at the level of the midfoot. 3. Diffuse low-level muscular T2 hyperintensity and enhancement, most consistent with diabetic myopathy. LEFT FOOT BONE Methicillin resistant staphylococcus aureus Staphylococcus lugdunensis MIC MIC CIPROFLOXACIN >=8 RESISTANT Resistant <=0.5 SENSI... Sensitive CLINDAMYCIN <=0.25 SENS... Sensitive >=8 RESISTANT Resistant ERYTHROMYCIN >=8 RESISTANT Resistant >=8 RESISTANT Resistant GENTAMICIN <=0.5 SENSI... Sensitive <=0.5  SENSI... Sensitive Inducible Clindamycin NEGATIVE Sensitive NEGATIVE Sensitive OXACILLIN >=4 RESISTANT Resistant 2 SENSITIVE Sensitive RIFAMPIN <=0.5 SENSI... Sensitive <=0.5 SENSI... Sensitive TETRACYCLINE <=1 SENSITIVE Sensitive <=1 SENSITIVE Sensitive TRIMETH/SULFA <=10 SENSIT Sensitive <=10 SENSIT Sensitive ... Marland Kitchen.. VANCOMYCIN 1 SENSITIVE Sensitive <=0.5 SENSI... Sensitive Right foot bone . Component 3 wk ago Specimen Description BONE Special Requests RIGHT 4 METATARSAL SAMPLE B Gram Stain NO WBC SEEN NO ORGANISMS SEEN Culture RARE METHICILLIN RESISTANT STAPHYLOCOCCUS AUREUS NO ANAEROBES ISOLATED Performed at [facility], [place] [address] [place], [place] [address] Report Status 10/08/2019 FINAL Organism ID, Bacteria METHICILLIN RESISTANT STAPHYLOCOCCUS AUREUS Resulting Agency [facility] Susceptibility Methicillin resistant staphylococcus aureus MIC CIPROFLOXACIN >=8 RESISTANT Resistant CLINDAMYCIN <=0.25 SENS... Sensitive ERYTHROMYCIN >=8 RESISTANT Resistant GENTAMICIN <=0.5 SENSI... Sensitive Inducible Clindamycin NEGATIVE Sensitive OXACILLIN >=4 RESISTANT Resistant RIFAMPIN <=0.5 SENSI... Sensitive TETRACYCLINE <=1 SENSITIVE Sensitive TRIMETH/SULFA <=10 SENSIT Sensitive ... VANCOMYCIN 1 SENSITIVE Sensitive This is a patient we had in clinic earlier this year with a wound  over his left fifth metatarsal head. 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 Doran Durand on 05/16/2019. He seems to have developed problems with wounds on his bilateral feet in June 2021 although he may have had problems earlier than this. He was in an urgent care with a right foot ulcer on 09/26/2019 and given a course of  doxycycline. This was apparently after having trouble getting into see orthopedics. He was seen by podiatry on 09/28/2019 noted to have bilateral lower extremity ulcers including the left lateral fifth metatarsal base and the right subfifth met head. It was noted that had purulent drainage at that time. He required hospitalization from 6/20 through 7/2. This was because of worsening right foot wounds. He underwent bilateral operative incision and drainage and bone biopsies bilaterally. Culture results are listed above. He has been referred back to clinic by Dr. Jacqualyn Posey of podiatry. He is also followed by Dr. Megan Salon who saw him yesterday. He was discharged from hospital on Zyvox Flagyl and Levaquin and yesterday changed to doxycycline Flagyl and Levaquin. His inflammatory markers on 6/26 showed a sedimentation rate of 129 and a C-reactive protein of 5. This is improved to 14 and 1.3 respectively. This would indicate improvement. ABIs in our clinic today were 1.23 on the right and 1.20 on the left 11/01/2019 on evaluation today patient appears to be doing fairly well in regard to the wounds on his feet at this point. Fortunately there is no signs of active infection at this time. No fevers, chills, nausea, vomiting, or diarrhea. He currently is seeing infectious disease and still under their care at this point. Subsequently he also has both wounds which she has not been using collagen on as he did not receive that in his packaging he did not call us and let us know that. Apparently that just was missed on the order. Nonetheless we will get that straightened out today. 8/9-Patient returns for bilateral foot wounds, using Prisma with hydrogel moistened dressings, and the wounds appear stable. Patient using surgical shoes, avoiding much pressure or weightbearing as much as possible 8/16; patient has bilateral foot wounds. 1 on the right lateral foot proximally the other is on the left mid lateral foot. Both  required debridement of callus and thick skin around the wounds. We have been using silver collagen 8/27; patient has bilateral lateral foot wounds. The area on the left substantially surrounded by callus and dry skin. This was removed from the wound edge. The underlying wound is small. The area on the right measured somewhat smaller today. 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 Unfortunately I did not update his antibiotics during today's visit. 9/10 I reviewed Dr. Hale Bogus last notes he felt he had completed antibiotics his inflammatory markers were reasonably well controlled. He has a small wound on the lateral left foot and a tiny area on the right which is just above closed. He is using Hydrofera Blue with border foam he has bilateral surgical shoes 9/24; 2 week f/u. doing well. right foot is closed. left foot still undermined. 10/14; right foot remains closed at the fifth met head. The area over the base of the left fifth metatarsal has a small open  area but considerable undermining towards the plantar foot. Thick callus skin around this suggests an adequate pressure relief. We have talked about this. He says he is going to go back into his cam boot. I suggested a total contact cast he did not seem enamored with this suggestion Electronic Signature(s) Signed: 01/16/2020 5:33:07 PM By: Max Ham MD Entered By: [name] on 01/16/2020 17:08:07 -------------------------------------------------------------------------------- Physical Exam Details Patient Name: Date of Service: [name], [name] 01/16/2020 3:45 PM Medical Record Number: [id number] Patient Account Number: [id number] Date of Birth/Sex: Treating RN: 30-Nov-1985 (34 y.o. [name] Primary Care Provider: Seward [name] Other Clinician: Referring Provider: Treating Provider/Extender: [name] in Treatment:  12 Constitutional Sitting or standing Blood Pressure is within target range for patient.. Patient is slightly tachycardic. Respirations regular, non-labored and within target range.. Temperature is normal and within the target range for the patient.Marland Kitchen Appears in no distress. Notes Wound exam; area on the right lateral fifth metatarsal head remains closed Area on the base of the fifth metatarsal once again a small open area but undermining towards the plantar foot perhaps 4 mm. I removed this and a thick surface skin/callus over the top of this. Hemostasis with a single silver nitrate this is not go to bone. It does not look to be infected Electronic Signature(s) Signed: 01/16/2020 5:33:07 PM By: Max Ham MD Entered By: [name] on 01/16/2020 17:09:12 -------------------------------------------------------------------------------- Physician Orders Details Patient Name: Date of Service: [name], [name] 01/16/2020 3:45 PM Medical Record Number: [id number] Patient Account Number: [id number] Date of Birth/Sex: Treating RN: Oct 10, 1985 (34 y.o. [name] Primary Care Provider: Seward [name] Other Clinician: Referring Provider: Treating Provider/Extender: [name] in Treatment: 12 Verbal / Phone Orders: No Diagnosis Coding ICD-10 Coding Code Description E11.621 Type 2 diabetes mellitus with foot ulcer L97.518 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ity L97.528 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ity 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, right ankle and foot B95.62 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 classified elsewhere Follow-up Appointments Return Appointment in 2 weeks. Dressing Change Frequency Wound #3 Left,Lateral Foot Change Dressing every other day. Skin Barriers/Peri-Wound Care Moisturizing lotion - both  legs and feet Wound Cleansing May shower and wash wound with soap and water. - on days that dressing is changed Primary Wound Dressing Wound #3 Left,Lateral Foot Hydrofera Blue Secondary Dressing Wound #3 Left,Lateral Foot Foam - foam donut. Kerlix/Rolled Gauze Dry Gauze Other: - cushion right lateral foot with foam Off-Loading Other: - continuing using the CAM boot. Electronic Signature(s) Signed: 01/16/2020 5:33:07 PM By: Max Ham MD Signed: 01/16/2020 5:51:00 PM By: [name] Entered By: [name] on 01/16/2020 16:32:56 -------------------------------------------------------------------------------- Problem List Details Patient Name: Date of Service: [name], [name] 01/16/2020 3:45 PM Medical Record Number: [id number] Patient Account Number: [id number] Date of Birth/Sex: Treating RN: 1985/07/21 (34 y.o. [name] Primary Care Provider: Seward [name] Other Clinician: Referring Provider: Treating Provider/Extender: [name] in Treatment: 12 Active Problems ICD-10 Encounter Code Description Active Date MDM Diagnosis E11.621 Type 2 diabetes mellitus with foot ulcer 10/24/2019 No Yes L97.518 Non-pressure chronic ulcer of other part of right foot with other specified 10/24/2019 No Yes severity L97.528 Non-pressure chronic ulcer of other part of left foot with other specified 10/24/2019 No Yes severity 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, right ankle and foot 10/24/2019 No Yes  B95.62 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 10/24/2019 No Yes classified elsewhere Inactive Problems ICD-10 Code Description Active Date Inactive Date 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 He comes in for review of a wound over the plantar fifth metatarsal head extending into the lateral part of the foot. He was followed for this previously by his podiatrist Dr. Cornelius [name]. As the patient tells his story he went to see podiatry first for a swelling he developed on the lateral part of his fifth metatarsal head in May. He states this was "open" by podiatry and the area closed. He was followed up in June and it was again opened callus removed and it closed promptly. There were plans being made for surgery on the fifth metatarsal head in June however his blood sugar was apparently too high for anesthesia. Apparently the area was debrided and opened again in June and it is never closed since. Looking over the records from podiatry I am really not able to follow this. It was clear when he was first seen it was before 5/14 at that point he already had a wound. By 5/17 the ulcer was resolved. I do not see anything about a procedure. On 5/28 noted to have pre-ulcerative moderate keratosis. X-ray noted 1/5 contracted toe and tailor's bunion and metatarsal deformity. On a visit date on 09/28/2018 the dorsal part of the left foot it healed and resolved. There was concern about swelling in his lower extremity he was sent to the ER.. As far as I can tell he was seen in the ER on 7/12  with an ulcer on his left foot. A DVT rule out of the left leg was negative. I do not think I have complete records from podiatry but I am not able to verify the procedures this patient states he had. He states after the last procedure the wound has never closed although I am not able to follow this in the records I have from podiatry. He has not had a recent x-ray The patient has been using Neosporin on the wound. He is wearing a Darco shoe. He is still very active up on his foot working and exercising. Past medical history; type 2 diabetes ketosis-prone, leg swelling with a negative DVT study in July. Non-smoker ABI in our clinic was 0.85 on the left 10/16; substantial wound on the plantar left fifth met head extending laterally almost to the dorsal fifth MTP. We have been using silver alginate we gave him a Darco forefoot off loader. An x-ray did not show evidence of osteomyelitis did note soft tissue emphysema which I think was due to gas tracking through an open wound. There is no doubt in my mind he requires an MRI 10/23; MRI not booked until 3 November at the earliest this is largely due to his glucose sensor in the right arm. We have been using silver alginate.  There has been an improvement 10/29; I am still not exactly sure when his MRI is booked for. He says it is the third but it is the 10th in epic. This definitely needs to be done. He is running a low-grade fever today but no other symptoms. No real improvement in the 1 02/26/2019 patient presents today for a follow-up visit here in our clinic he is last been seen in the clinic on October 29. Subsequently we were working on getting MRI to evaluate and see what exactly was going on and where we would need to go from the standpoint of whether or not he had osteomyelitis and again what treatments were going be required. Subsequently the patient ended up being admitted to the hospital on 02/07/2019 and was discharged on 02/14/2019. This is a  somewhat interesting admission with a discharge diagnosis of pneumonia due to COVID-19 although he was positive for COVID-19 when tested at the urgent care but negative x2 when he was actually in the hospital. With that being said he did have acute respiratory failure with hypoxia and it was noted he also have a left foot ulceration with osteomyelitis. With that being said he did require oxygen for his pneumonia and I level 4 L. He was placed on antivirals and steroids for the COVID-19. He was also transferred to the [facility] at one point. Nonetheless he did subsequently discharged home and since being home has done much better in that regard. The CT angiogram did not show any pulmonary embolism. With regard to the osteomyelitis the patient was placed on vancomycin and Zosyn while in the hospital but has been changed to Augmentin at discharge. It was also recommended that he follow- up with wound care and podiatry. Podiatry however wanted him to see [name] according to the patient prior to them doing anything further. His hemoglobin A1c was 9.9 as noted in the hospital. Have an MRI of the left foot performed while in the hospital on 02/04/2019. 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 There is an overlying plantar open wound noted an abscess tracking back along the lateral aspect of the fifth metatarsal shaft. There is otherwise diffuse cellulitis and mild fasciitis without findings of polymyositis. The patient did have recently pneumonia secondary to COVID-19 I looked in the chart through epic and it does appear that the patient may need to have an additional x-ray just to ensure everything is cleared and that he has no airspace disease prior to putting him into the [name]. 03/05/2019; patient was readmitted to the clinic last week. He was hospitalized twice for a viral upper respiratory tract  infection from 11/1 through 11/4 and then 11/5 through 11/12 ultimately this turned out to be Covid pneumonitis. Although he was discharged on oxygen he is not using it. He says he feels fine. He has no exercise limitation no cough no sputum. His O2 sat in our clinic today was 100% on room air. 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 He received Vanco and Zosyn in the hospital and then was discharged on 2 weeks of Augmentin. I do not see any relevant cultures. He was supposed to follow-up with infectious disease but I do not see that he has an appointment. 12/8; patient saw Dr. Novella Olive of infectious disease last week. He felt  that he had had adequate antibiotic therapy. He did not go to follow-up with Dr. Amalia Hailey of podiatry and I have again talked to him about the pros and cons of this. He does not want to consider a ray amputation of this time. He is aware of the risks of recurrence, migration etc. He started HBO today and tolerated this well. He can complete the Augmentin that I gave him last week. I have looked over the lab work that Dr. Chana Bode ordered his C-reactive protein was 3.3 and his sedimentation rate was 17. The C-reactive protein is never really been measurably that high in this patient 12/15; not much change in the wound today however he has undermining along the lateral part of the foot again more extensively than last week. He has some rims of epithelialization. We have been using silver alginate. He is undergoing hyperbarics but did not dive today 12/18; in for his obligatory first total contact cast change. Unfortunately there was pus coming from the undermining area around his fifth metatarsal head. This was cultured but will preclude reapplication of a cast. He is seen in conjunction with HBO 12/24; patient had staph lugdunensis in the wound in the undermining area  laterally last time. We put him on doxycycline which should have covered this. The wound looks better today. I am going to give him another week of doxycycline before reattempting the total contact cast 12/31; the patient is completing antibiotics. Hemorrhagic debris in the distal part of the wound with some undermining distally. He also had hyper granulation. Extensive debridement with a #5 curette. The infected area that was on the lateral part of the fifth met head is closed over. I do not think he needs any more antibiotics. Patient was seen prior to HBO. Preparations for a total contact cast were made in the cast will be placed post hyperbarics 04/11/19; once again the patient arrives today without complaint. He had been in a cast all week noted that he had heavy drainage this week. This resulted in large raised areas of macerated tissue around the wound 1/14; wound bed looks better slightly smaller. Hydrofera Blue has been changing himself. He had a heavy drainage last week which caused a lot of maceration around the wound so I took him out of a total contact cast he says the drainage is actually better this week He is seen today in conjunction with HBO 1/21; returns to clinic. He was up in [place] for a day or 2 attending a funeral. He comes back in with the wound larger and with a large area of exposed bone. He had osteomyelitis and septic arthritis of the fifth left metatarsal head while he was in hospital. He received IV antibiotics in the hospital for a prolonged period of time then 3 weeks of Augmentin. Subsequently I gave him 2 weeks of doxycycline for more superficial wound infection. When I saw this last week the wound was smaller the surface of the wound looks satisfactory. 1/28; patient missed hyperbarics today. Bone biopsy I did last time showed Enterococcus faecalis and Staphylococcus lugdunensis . He has a wide area of exposed bone. We are going to use silver alginate as of  today. I had another ethical discussion with the patient. 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 In this situation I think the likelihood of healing this is low. Therefore I have recommended a ray amputation and with the patient's agreement I have referred him to Dr. Doran Durand. The other  issue is that his compliance with hyperbarics has been minimal because of his work schedule and given his underlying decision I am going to stop this today READMISSION 10/24/2019 MRI 09/29/2019 left foot IMPRESSION: 1. Apparent skin ulceration inferior and lateral to the 5th metatarsal base with underlying heterogeneous T2 signal and enhancement in the subcutaneous fat. Small peripherally enhancing fluid collections along the plantar and lateral aspects of the 5th metatarsal base suspicious for abscesses. 2. Interval amputation through the mid 5th metatarsal with nonspecific low-level marrow edema and enhancement. Given the proximity to the adjacent soft tissue inflammatory changes, osteomyelitis cannot be excluded. 3. The additional bones appear unremarkable. MRI 09/29/2019 right foot IMPRESSION: 1. Soft tissue ulceration lateral to the 5th MTP joint. There is low-level T2 hyperintensity within the 4th and 5th metatarsal heads and adjacent proximal phalanges without abnormal T1 signal or cortical destruction. These findings are nonspecific and could be seen with early marrow edema, hyperemia or early osteomyelitis. No evidence of septic joint. 2. Mild tenosynovitis and synovial enhancement associated with the extensor digitorum tendons at the level of the midfoot. 3. Diffuse low-level muscular T2 hyperintensity and enhancement, most consistent with diabetic myopathy. LEFT FOOT BONE Methicillin resistant staphylococcus aureus Staphylococcus lugdunensis MIC MIC CIPROFLOXACIN >=8 RESISTANT Resistant <=0.5 SENSI... Sensitive CLINDAMYCIN <=0.25 SENS... Sensitive >=8  RESISTANT Resistant ERYTHROMYCIN >=8 RESISTANT Resistant >=8 RESISTANT Resistant GENTAMICIN <=0.5 SENSI... Sensitive <=0.5 SENSI... Sensitive Inducible Clindamycin NEGATIVE Sensitive NEGATIVE Sensitive OXACILLIN >=4 RESISTANT Resistant 2 SENSITIVE Sensitive RIFAMPIN <=0.5 SENSI... Sensitive <=0.5 SENSI... Sensitive TETRACYCLINE <=1 SENSITIVE Sensitive <=1 SENSITIVE Sensitive TRIMETH/SULFA <=10 SENSIT Sensitive <=10 SENSIT Sensitive ... Marland Kitchen.. VANCOMYCIN 1 SENSITIVE Sensitive <=0.5 SENSI... Sensitive Right foot bone . Component 3 wk ago Specimen Description BONE Special Requests RIGHT 4 METATARSAL SAMPLE B Gram Stain NO WBC SEEN NO ORGANISMS SEEN Culture RARE METHICILLIN RESISTANT STAPHYLOCOCCUS AUREUS NO ANAEROBES ISOLATED Performed at [facility], [place] [address], [place], [place] [address] Report Status 10/08/2019 FINAL Organism ID, Bacteria METHICILLIN RESISTANT STAPHYLOCOCCUS AUREUS Resulting Agency [facility] Susceptibility Methicillin resistant staphylococcus aureus MIC CIPROFLOXACIN >=8 RESISTANT Resistant CLINDAMYCIN <=0.25 SENS... Sensitive ERYTHROMYCIN >=8 RESISTANT Resistant GENTAMICIN <=0.5 SENSI... Sensitive Inducible Clindamycin NEGATIVE Sensitive OXACILLIN >=4 RESISTANT Resistant RIFAMPIN <=0.5 SENSI... Sensitive TETRACYCLINE <=1 SENSITIVE Sensitive TRIMETH/SULFA <=10 SENSIT Sensitive ... VANCOMYCIN 1 SENSITIVE Sensitive This is a patient we had in clinic earlier this year with a wound over his left fifth metatarsal head. 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 Doran Durand on 05/16/2019. He seems to have developed problems with wounds on his bilateral feet in June 2021 although he  may have had problems earlier than this. He was in an urgent care with a right foot ulcer on 09/26/2019 and given a course of doxycycline. This was apparently after having trouble getting into see orthopedics. He was seen by podiatry on 09/28/2019 noted to have bilateral lower extremity ulcers including the left lateral fifth metatarsal base and the right subfifth met head. It was noted that had purulent drainage at that time. He required hospitalization from 6/20 through 7/2. This was because of worsening right foot wounds. He underwent bilateral operative incision and drainage and bone biopsies bilaterally. Culture results are listed above. He has been referred back to clinic by Dr.  Wagoner of podiatry. He is also followed by Dr. Megan Salon who saw him yesterday. He was discharged from hospital on Zyvox Flagyl and Levaquin and yesterday changed to doxycycline Flagyl and Levaquin. His inflammatory markers on 6/26 showed a sedimentation rate of 129 and a C-reactive protein of 5. This is improved to 14 and 1.3 respectively. This would indicate improvement. ABIs in our clinic today were 1.23 on the right and 1.20 on the left 11/01/2019 on evaluation today patient appears to be doing fairly well in regard to the wounds on his feet at this point. Fortunately there is no signs of active infection at this time. No fevers, chills, nausea, vomiting, or diarrhea. He currently is seeing infectious disease and still under their care at this point. Subsequently he also has both wounds which she has not been using collagen on as he did not receive that in his packaging he did not call us and let us know that. Apparently that just was missed on the order. Nonetheless we will get that straightened out today. 8/9-Patient returns for bilateral foot wounds, using Prisma with hydrogel moistened dressings, and the wounds appear stable. Patient using surgical shoes, avoiding much pressure or weightbearing as much as  possible 8/16; patient has bilateral foot wounds. 1 on the right lateral foot proximally the other is on the left mid lateral foot. Both required debridement of callus and thick skin around the wounds. We have been using silver collagen 8/27; patient has bilateral lateral foot wounds. The area on the left substantially surrounded by callus and dry skin. This was removed from the wound edge. The underlying wound is small. The area on the right measured somewhat smaller today. 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 Unfortunately I did not update his antibiotics during today's visit. 9/10 I reviewed Dr. Hale Bogus last notes he felt he had completed antibiotics his inflammatory markers were reasonably well controlled. He has a small wound on the lateral left foot and a tiny area on the right which is just above closed. He is using Hydrofera Blue with border foam he has bilateral surgical shoes 9/24; 2 week f/u. doing well. right foot is closed. left foot still undermined. 10/14; right foot remains closed at the fifth met head. The area over the base of the left fifth metatarsal has a small open area but considerable undermining towards the plantar foot. Thick callus skin around this suggests an adequate pressure relief. We have talked about this. He says he is going to go back into his cam boot. I suggested a total contact cast he did not seem enamored with this suggestion Objective Constitutional Sitting or standing Blood Pressure is within target range for patient.. Patient is slightly tachycardic. Respirations regular, non-labored and within target range.. Temperature is normal and within the target range for the patient.Marland Kitchen Appears in no distress. Vitals Time Taken: 4:00 PM, Height: 77 in, Weight: 280 lbs, BMI: 33.2, Temperature: 98.4 F, Pulse: 112 bpm, Respiratory Rate: 18 breaths/min, Blood Pressure: 120/82 mmHg. General  Notes: Wound exam; area on the right lateral fifth metatarsal head remains closed ooArea on the base of the fifth metatarsal once again a small open area but undermining towards the plantar foot perhaps 4 mm. I removed this and a thick surface skin/callus over the top of this. Hemostasis with a single silver nitrate this is not go to bone. It does not look to be infected Integumentary (Hair, Skin) Wound #3 status  is Open. Original cause of wound was Trauma. The wound is located on the Left,Lateral Foot. The wound measures 0.5cm length x 0.3cm width x 0.5cm depth; 0.118cm^2 area and 0.059cm^3 volume. There is Fat Layer (Subcutaneous Tissue) exposed. There is no tunneling or undermining noted. There is a small amount of serosanguineous drainage noted. The wound margin is flat and intact. There is large (67-100%) pink granulation within the wound bed. There is no necrotic tissue within the wound bed. 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 There was a Excisional Skin/Subcutaneous Tissue Debridement with a total area of 1 sq cm performed by [name]., MD. With the following instrument(s): Curette to remove Viable and Non-Viable tissue/material. Material removed includes Callus, Subcutaneous Tissue, Slough, Skin: Dermis, Skin: Epidermis, and Fibrin/Exudate after achieving pain control using Lidocaine 4% T opical Solution. A time out was conducted at  16:20, prior to the start of the procedure. A Moderate amount of bleeding was controlled with Silver Nitrate. The procedure was tolerated well with a pain level of 0 throughout and a pain level of 0 following the procedure. Post Debridement Measurements: 0.5cm length x 0.3cm width x 0.5cm depth; 0.059cm^3 volume. Character of Wound/Ulcer Post Debridement requires further debridement. Severity of Tissue Post Debridement is: Limited to breakdown of skin. Post procedure Diagnosis Wound #3: Same as Pre-Procedure Plan Follow-up Appointments: Return Appointment in 2 weeks. Dressing Change Frequency: Wound #3 Left,Lateral Foot: Change Dressing every other day. Skin Barriers/Peri-Wound Care: Moisturizing lotion - both legs and feet Wound Cleansing: May shower and wash wound with soap and water. - on days that dressing is changed Primary Wound Dressing: Wound #3 Left,Lateral Foot: Hydrofera Blue Secondary Dressing: Wound #3 Left,Lateral Foot: Foam - foam donut. Kerlix/Rolled Gauze Dry Gauze Other: - cushion right lateral foot with foam Off-Loading: Other: - continuing using the CAM boot. #1 I continued with the [facility] for another week as he already has supplies of this 2. If we continue down this road I will probably change the dressing perhaps to Iodoflex although I think this is an offloading issue and we talked about this. Something tells me that he is more over on the lateral part of his foot and his stance phase of his gait. 3. I had suggested a total contact cast although he was not interested in this from previous experience Electronic Signature(s) Signed: 01/16/2020 5:33:07 PM By: Max Ham MD Entered By: [name] on 01/16/2020 17:10:11 -------------------------------------------------------------------------------- SuperBill Details Patient Name: Date of Service: [name], [name] 01/16/2020 Medical Record Number: [id number] Patient Account Number:  [id number] Date of Birth/Sex: Treating RN: 06-28-1985 (34 y.o. [name], Meta.Reding Primary Care Provider: Seward [name] Other Clinician: Referring Provider: Treating Provider/Extender: [name] in Treatment: 12 Diagnosis Coding ICD-10 Codes Code Description [phone] Type 2 diabetes mellitus with foot ulcer L97.518 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ity L97.528 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ity 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, right ankle and  foot B95.62 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 classified elsewhere Facility Procedures CPT4 Code: [id number] Description: 43837 - DEB SUBQ TISSUE 20 SQ CM/< ICD-10 Diagnosis Description L97.518 Non-pressure chronic ulcer of other part of right foot with other specified sev Modifier: erity Quantity: 1 Physician Procedures : CPT4 Code Description Modifier [id number] - WC PHYS SUBQ TISS 20 SQ CM ICD-10 Diagnosis Description L97.518 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ity Quantity: 1 Electronic Signature(s) Signed: 01/16/2020 5:33:07 PM By: Max Ham MD Entered By: [name] on 01/16/2020 17:10:23

## 2020-01-17 NOTE — Progress Notes (Signed)
[place], [name] ([id number]) Visit Report for 01/16/2020 Arrival Information Details Patient Name: Date of Service: Geoffry Paradise 01/16/2020 3:45 PM Medical Record Number: [id number] Patient Account Number: [id number] Date of Birth/Sex: Treating RN: September 18, 1985 (34 y.o. [name], [name] Primary Care Mava Suares: [name] Other Clinician: Referring Moritz Lever: Treating Torres Hardenbrook/Extender: Gwyneth Revels in Treatment: 12 Visit Information History Since Last Visit Added or deleted any medications: No Patient Arrived: Ambulatory Any new allergies or adverse reactions: No Arrival Time: 16:00 Had a fall or experienced change in No Accompanied By: self activities of daily living that may affect Transfer Assistance: None risk of falls: Patient Identification Verified: Yes Signs or symptoms of abuse/neglect since last visito No Secondary Verification Process Completed: Yes Hospitalized since last visit: No Patient Requires Transmission-Based Precautions: No Implantable device outside of the clinic excluding No Patient Has Alerts: No cellular tissue based products placed in the center since last visit: Has Dressing in Place as Prescribed: Yes Pain Present Now: No Electronic Signature(s) Signed: 01/17/2020 11:16:13 AM By: Karl Ito Entered By: Karl Ito on 01/16/2020 16:00:47 -------------------------------------------------------------------------------- Encounter Discharge Information Details Patient Name: Date of Service: [name], [name] 01/16/2020 3:45 PM Medical Record Number: [id number] Patient Account Number: [id number] Date of Birth/Sex: Treating RN: Aug 25, 1985 (34 y.o. [name] Primary Care Allon Costlow: [name] Other Clinician: Referring Aymar Whitfill: Treating Rosette Bellavance/Extender: Gwyneth Revels in Treatment: 12 Encounter Discharge Information Items Post Procedure Vitals Discharge Condition:  Stable Temperature (F): 98.4 Ambulatory Status: Ambulatory Pulse (bpm): 112 Discharge Destination: Home Respiratory Rate (breaths/min): 18 Transportation: Private Auto Blood Pressure (mmHg): 120/82 Accompanied By: self Schedule Follow-up Appointment: Yes Clinical Summary of Care: Patient Declined Electronic Signature(s) Signed: 01/16/2020 5:32:04 PM By: [name] EMT/HBOT/SD Entered By: [name] on 01/16/2020 16:57:33 -------------------------------------------------------------------------------- Lower Extremity Assessment Details Patient Name: Date of Service: [name], [name] 01/16/2020 3:45 PM Medical Record Number: [id number] Patient Account Number: [id number] Date of Birth/Sex: Treating RN: 1986/01/17 (34 y.o. [name] Primary Care Lavren Lewan: [name] Other Clinician: Referring Latorya Bautch: Treating Estalee Mccandlish/Extender: Gwyneth Revels in Treatment: 12 Edema Assessment Assessed: [name]: No] Franne Forts: No] Edema: [Left: No] [Right: No] Calf Left: Right: Point of Measurement: 48 cm From Medial Instep 48 cm 48 cm Ankle Left: Right: Point of Measurement: 10 cm From Medial Instep 30 cm 31 cm Electronic Signature(s) Signed: 01/16/2020 5:51:40 PM By: Yevonne Pax RN Entered By: Yevonne Pax on 01/16/2020 16:10:24 -------------------------------------------------------------------------------- Multi Wound Chart Details Patient Name: Date of Service: [name], [name] 01/16/2020 3:45 PM Medical Record Number: [id number] Patient Account Number: [id number] Date of Birth/Sex: Treating RN: 1986-02-20 (34 y.o. [name] Primary Care Teri Diltz: [name] Other Clinician: Referring Don Giarrusso: Treating Corita Allinson/Extender: Gwyneth Revels in Treatment: 12 Vital Signs Height(in): 77 Pulse(bpm): 112 Weight(lbs): 280 Blood Pressure(mmHg): 120/82 Body Mass Index(BMI): 33 Temperature(F): 98.4 Respiratory  Rate(breaths/min): 18 Photos: [3:No Photos Left, Lateral Foot] [N/A:N/A N/A] Wound Location: [3:Trauma] [N/A:N/A] Wounding Event: [3:Diabetic Wound/Ulcer of the Lower] [N/A:N/A] Primary Etiology: [3:Extremity Type II Diabetes] [N/A:N/A] Comorbid History: [3:10/02/2019] [N/A:N/A] Date Acquired: [3:12] [N/A:N/A] Weeks of Treatment: [3:Open] [N/A:N/A] Wound Status: [3:0.5x0.3x0.5] [N/A:N/A] Measurements L x W x D (cm) [3:0.118] [N/A:N/A] A (cm) : rea [3:0.059] [N/A:N/A] Volume (cm) : [3:92.80%] [N/A:N/A] % Reduction in A rea: [3:64.20%] [N/A:N/A] % Reduction in Volume: [3:Grade 2] [N/A:N/A] Classification: [3:Small] [N/A:N/A] Exudate A mount: [3:Serosanguineous] [N/A:N/A] Exudate Type: [3:red, brown] [N/A:N/A] Exudate Color: [3:Flat and Intact] [N/A:N/A] Wound Margin: [3:Large (  67-100%)] [N/A:N/A] Granulation Amount: [3:Pink] [N/A:N/A] Granulation Quality: [3:None Present (0%)] [N/A:N/A] Necrotic Amount: [3:Fat Layer (Subcutaneous Tissue): Yes N/A] Exposed Structures: [3:Fascia: No Tendon: No Muscle: No Joint: No Bone: No Small (1-33%)] [N/A:N/A] Epithelialization: [3:Debridement - Excisional] [N/A:N/A] Debridement: Pre-procedure Verification/Time Out 16:20 [N/A:N/A] Taken: [3:Lidocaine 4% T opical Solution] [N/A:N/A] Pain Control: [3:Callus, Subcutaneous, Slough] [N/A:N/A] Tissue Debrided: [3:Skin/Subcutaneous Tissue] [N/A:N/A] Level: [3:1] [N/A:N/A] Debridement A (sq cm): [3:rea Curette] [N/A:N/A] Instrument: [3:Moderate] [N/A:N/A] Bleeding: [3:Silver Nitrate] [N/A:N/A] Hemostasis A chieved: [3:0] [N/A:N/A] Procedural Pain: [3:0] [N/A:N/A] Post Procedural Pain: [3:Procedure was tolerated well] [N/A:N/A] Debridement Treatment Response: [3:0.5x0.3x0.5] [N/A:N/A] Post Debridement Measurements L x W x D (cm) [3:0.059] [N/A:N/A] Post Debridement Volume: (cm) [3:Debridement] [N/A:N/A] Treatment Notes Wound #3 (Left, Lateral Foot) 3. Primary Dressing Applied Hydrofera Blue 4.  Secondary Dressing Roll Gauze Foam 5. Secured With Tape Notes foam donut Electronic Signature(s) Signed: 01/16/2020 5:33:07 PM By: Baltazar Najjar MD Signed: 01/16/2020 5:51:00 PM By: Shawn Stall Entered By: Baltazar Najjar on 01/16/2020 17:06:41 -------------------------------------------------------------------------------- Multi-Disciplinary Care Plan Details Patient Name: Date of Service: [name], [name] 01/16/2020 3:45 PM Medical Record Number: [id number] Patient Account Number: [id number] Date of Birth/Sex: Treating RN: 07/06/1985 (34 y.o. [name] Primary Care Nyeisha Goodall: [name] Other Clinician: Referring Shaquanta Harkless: Treating Nyja Westbrook/Extender: Gwyneth Revels in Treatment: 12 Active Inactive Nutrition Nursing Diagnoses: Imbalanced nutrition Potential for alteratiion in Nutrition/Potential for imbalanced nutrition Goals: Patient/caregiver agrees to and verbalizes understanding of need to use nutritional supplements and/or vitamins as prescribed Date Initiated: 10/24/2019 Target Resolution Date: 01/24/2020 Goal Status: Active Patient/caregiver will maintain therapeutic glucose control Date Initiated: 10/24/2019 Target Resolution Date: 01/24/2020 Goal Status: Active Interventions: Assess HgA1c results as ordered upon admission and as needed Assess patient nutrition upon admission and as needed per policy Provide education on elevated blood sugars and impact on wound healing Provide education on nutrition Treatment Activities: Education provided on Nutrition : 11/01/2019 Notes: Electronic Signature(s) Signed: 01/16/2020 5:51:00 PM By: Shawn Stall Entered By: Shawn Stall on 01/16/2020 16:29:12 -------------------------------------------------------------------------------- Pain Assessment Details Patient Name: Date of Service: [name] D 01/16/2020 3:45 PM Medical Record Number: [id number] Patient Account Number:  [id number] Date of Birth/Sex: Treating RN: 1985-08-20 (34 y.o. [name] Primary Care Teeghan Hammer: [name] Other Clinician: Referring Breyona Swander: Treating Christella App/Extender: Gwyneth Revels in Treatment: 12 Active Problems Location of Pain Severity and Description of Pain Patient Has Paino No Site Locations Pain Management and Medication Current Pain Management: Electronic Signature(s) Signed: 01/16/2020 5:51:00 PM By: Shawn Stall Signed: 01/17/2020 11:16:13 AM By: Karl Ito Entered By: Karl Ito on 01/16/2020 16:01:11 -------------------------------------------------------------------------------- Patient/Caregiver Education Details Patient Name: Date of Service: [name] D 10/14/2021andnbsp3:45 PM Medical Record Number: [id number] Patient Account Number: [id number] Date of Birth/Gender: Treating RN: [birth year] (34 y.o. [name] Primary Care Physician: [name] Other Clinician: Referring Physician: Treating Physician/Extender: Gwyneth Revels in Treatment: 12 Education Assessment Education Provided To: Patient Education Topics Provided Elevated Blood Sugar/ Impact on Healing: Handouts: Elevated Blood Sugars: How Do They Affect Wound Healing Methods: Explain/Verbal Responses: Reinforcements needed Electronic Signature(s) Signed: 01/16/2020 5:51:00 PM By: Shawn Stall Entered By: Shawn Stall on 01/16/2020 16:29:23 -------------------------------------------------------------------------------- Wound Assessment Details Patient Name: Date of Service: [name] D 01/16/2020 3:45 PM Medical Record Number: [id number] Patient Account Number: [id number] Date of Birth/Sex: Treating RN: 01-31-1986 (34 y.o. [name] Primary Care Keyauna Graefe: [name] Other Clinician: Referring Frazier Balfour: Treating Yazleen Molock/Extender: Gwyneth Revels  in Treatment:  12 Wound Status Wound Number: 3 Primary Etiology: Diabetic Wound/Ulcer of the Lower Extremity Wound Location: Left, Lateral Foot Wound Status: Open Wounding Event: Trauma Comorbid History: Type II Diabetes Date Acquired: 10/02/2019 Weeks Of Treatment: 12 Clustered Wound: No Wound Measurements Length: (cm) 0.5 Width: (cm) 0.3 Depth: (cm) 0.5 Area: (cm) 0.118 Volume: (cm) 0.059 % Reduction in Area: 92.8% % Reduction in Volume: 64.2% Epithelialization: Small (1-33%) Tunneling: No Undermining: No Wound Description Classification: Grade 2 Wound Margin: Flat and Intact Exudate Amount: Small Exudate Type: Serosanguineous Exudate Color: red, brown Foul Odor After Cleansing: No Slough/Fibrino No Wound Bed Granulation Amount: Large (67-100%) Exposed Structure Granulation Quality: Pink Fascia Exposed: No Necrotic Amount: None Present (0%) Fat Layer (Subcutaneous Tissue) Exposed: Yes Tendon Exposed: No Muscle Exposed: No Joint Exposed: No Bone Exposed: No Treatment Notes Wound #3 (Left, Lateral Foot) 3. Primary Dressing Applied Hydrofera Blue 4. Secondary Dressing Roll Gauze Foam 5. Secured With Tape Notes foam donut Electronic Signature(s) Signed: 01/16/2020 5:51:00 PM By: Shawn Stall Signed: 01/16/2020 5:51:40 PM By: Yevonne Pax RN Entered By: Yevonne Pax on 01/16/2020 16:11:05 -------------------------------------------------------------------------------- Vitals Details Patient Name: Date of Service: [name], [name] 01/16/2020 3:45 PM Medical Record Number: [id number] Patient Account Number: [id number] Date of Birth/Sex: Treating RN: [birth year] (34 y.o. [name] Primary Care Quintyn Dombek: [name] Other Clinician: Referring Desaree Downen: Treating Nainoa Woldt/Extender: Gwyneth Revels in Treatment: 12 Vital Signs Time Taken: 16:00 Temperature (F): 98.4 Height (in): 77 Pulse (bpm): 112 Weight (lbs): 280 Respiratory Rate  (breaths/min): 18 Body Mass Index (BMI): 33.2 Blood Pressure (mmHg): 120/82 Reference Range: 80 - 120 mg / dl Electronic Signature(s) Signed: 01/17/2020 11:16:13 AM By: Karl Ito Entered By: Karl Ito on 01/16/2020 16:01:04

## 2020-01-25 ENCOUNTER — Encounter (HOSPITAL_COMMUNITY): Payer: Self-pay | Admitting: Emergency Medicine

## 2020-01-25 ENCOUNTER — Emergency Department (HOSPITAL_COMMUNITY): Payer: BC Managed Care – PPO

## 2020-01-25 ENCOUNTER — Emergency Department (HOSPITAL_COMMUNITY)
Admission: EM | Admit: 2020-01-25 | Discharge: 2020-01-25 | Disposition: A | Discharge status: Home / Self Care | Payer: BC Managed Care – PPO | Attending: Emergency Medicine | Admitting: Emergency Medicine

## 2020-01-25 DIAGNOSIS — E10319 Type 1 diabetes mellitus with unspecified diabetic retinopathy without macular edema: Secondary | ICD-10-CM | POA: Diagnosis not present

## 2020-01-25 DIAGNOSIS — M25562 Pain in left knee: Secondary | ICD-10-CM | POA: Diagnosis present

## 2020-01-25 DIAGNOSIS — E1065 Type 1 diabetes mellitus with hyperglycemia: Secondary | ICD-10-CM | POA: Diagnosis not present

## 2020-01-25 DIAGNOSIS — Z794 Long term (current) use of insulin: Secondary | ICD-10-CM | POA: Diagnosis not present

## 2020-01-25 DIAGNOSIS — Z8616 Personal history of COVID-19: Secondary | ICD-10-CM | POA: Diagnosis not present

## 2020-01-25 MED ORDER — HYDROCODONE-ACETAMINOPHEN 5-325 MG PO TABS
1.0000 | ORAL_TABLET | Freq: Once | ORAL | Status: AC
  Administered 2020-01-25: 1 via ORAL
  Filled 2020-01-25: qty 1

## 2020-01-25 NOTE — ED Triage Notes (Signed)
Pt reports while dancing at a wedding @ 1 hour ago, pt felt his knee "give out" fell to the ground, pt reports knee cap was displaced laterally. Friends assisted to reduce at that time.

## 2020-01-25 NOTE — ED Notes (Signed)
Ice pack applied to left knee

## 2020-01-25 NOTE — Progress Notes (Signed)
Orthopedic Tech Progress Note Patient Details:  [name] 1986-02-24 [id number]  Ortho Devices Type of Ortho Device: Crutches, Knee Immobilizer Ortho Device/Splint Location: lle Ortho Device/Splint Interventions: Ordered, Application, Adjustment   Post Interventions Patient Tolerated: Well Instructions Provided: Care of device, Adjustment of device   [name] 01/25/2020, 11:46 PM

## 2020-01-25 NOTE — Discharge Instructions (Signed)
You can take Tylenol or Ibuprofen as directed for pain. You can alternate Tylenol and Ibuprofen every 4 hours. If you take Tylenol at 1pm, then you can take Ibuprofen at 5pm. Then you can take Tylenol again at 9pm.   Follow the RICE (Rest, Ice, Compression, Elevation) protocol as directed.   Wear the knee immobilizer for support and stabilization. Use crutches as needed.  Follow up with the referred orthopedic doctor.   Return to the Emergency Dept for any worsening pain, numbness/weakness or any other worsening or concerning symptoms.

## 2020-01-25 NOTE — ED Provider Notes (Signed)
[name] EMERGENCY DEPARTMENT Provider Note   CSN: [id number] Arrival date & time: 01/25/20  1856     History Chief Complaint  Patient presents with  . Knee Injury    [name] is a 34 y.o. male possible history of diabetes, osteomyelitis presents for evaluation of left knee pain.  Patient reports that he was dancing at a wedding approximately 1 hour prior to ED arrival.  He states that when he was doing a move, his knee gave out and his kneecap went to the lateral side.  His girlfriend solid and put the kneecap back in place.  He has been able to ambulate and bear weight on the leg since this happened.  He reports some residual pain noted to the left knee.  He denies any numbness/weakness.  He has had previous surgery on his left foot and has some associated swelling which he states is not new.  The history is provided by the patient.       Past Medical History:  Diagnosis Date  . Diabetes mellitus   . Osteomyelitis [facility])     Patient Active Problem List   Diagnosis Date Noted  . Diabetic foot ulcer associated with type 1 diabetes mellitus (HCC)   . Leg swelling   . Cellulitis of right lower extremity   . Diabetic foot infection (HCC) 09/28/2019  . Normocytic anemia 09/28/2019  . 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 Severe nonproliferative diabetic retinopathy of left eye, with macular edema, associated with type 1 diabetes mellitus (HCC) 07/18/2019  . Diabetic cataract (HCC) 07/18/2019  . Osteomyelitis of ankle or foot, acute, left (HCC) 03/11/2019  . Pneumonia due to severe acute respiratory syndrome coronavirus 2 (SARS-CoV-2) 02/12/2019  . Pneumonia due to COVID-19 virus 02/07/2019  . Acute respiratory failure with hypoxia (HCC) 02/07/2019  . Uncontrolled type 1 diabetes mellitus with hyperglycemia (HCC) 02/07/2019  . COVID-19 virus infection  02/04/2019  . Sepsis (HCC) 02/03/2019  . 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 AKI (acute kidney injury) (HCC) 02/03/2019  . CAP (community acquired pneumonia) 06/28/2017  . 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 Hyperglycemia without ketosis 01/19/2013    Past Surgical History:  Procedure Laterality Date  . 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 NO PAST SURGERIES    . WOUND DEBRIDEMENT Bilateral 10/02/2019   Procedure: DEBRIDEMENT WOUNDS BOTH LOWER EXTREMITIES WITH BONE BIOPSIES;  Surgeon: Vivi Barrack, DPM;  Location: [facility] OR;  Service: Podiatry;  Laterality: Bilateral;       Family History  Problem Relation Age of Onset  . Hypertension Other   . Diabetes Other   . Healthy Mother   . Colon cancer Neg Hx   . Stomach cancer Neg Hx   . Pancreatic cancer Neg Hx   . Esophageal cancer Neg Hx     Social History   Tobacco Use  . Smoking status: Never Smoker  . Smokeless tobacco: Never Used  Vaping Use  . Vaping Use: Never used  Substance Use Topics  . Alcohol use: No  . Drug use: No    Home Medications Prior to Admission medications   Medication Sig Start Date End Date Taking? Authorizing Provider  Continuous Blood Gluc Sensor (FREESTYLE LIBRE 14  DAY SENSOR) MISC Inject 1 patch into the skin every 14 (fourteen) days.    [provider]  Continuous Blood Gluc Sensor MISC 1 each by Does not apply route as directed. Use as directed every 14 days. May dispense FreeStyle [employer] or similar. 06/30/17   Darlin Drop, DO  insulin aspart (NOVOLOG) 100 UNIT/ML injection Inject 0-15 Units into the skin 3 (three) times daily with meals. Patient taking differently: Inject 7-12 Units into the skin 3 (three) times daily as needed (before meals, if BGL is 250 or greater).   07/01/17   Lonia Blood, MD  insulin glargine (LANTUS SOLOSTAR) 100 UNIT/ML Solostar Pen Inject 40 Units into the skin daily before breakfast. 08/20/19   Gwyneth Sprout, MD  silver sulfADIAZINE (SILVADENE) 1 % cream Apply pea-sized amount to wound daily. 10/10/19   Park Liter, DPM    Allergies    Basaglar Stephanie Coup [insulin glargine], Metformin and related, and Trulicity [dulaglutide]  Review of Systems   Review of Systems  Musculoskeletal:       Left knee pain  Neurological: Negative for weakness and numbness.  All other systems reviewed and are negative.   Physical Exam Updated Vital Signs BP (!) 159/89 (BP Location: Right Arm)   Pulse 98   Temp 98.6 F (37 C) (Oral)   Resp 16   SpO2 99%   Physical Exam Vitals and nursing note reviewed.  Constitutional:      Appearance: He is well-developed.  HENT:     Head: Normocephalic and atraumatic.  Eyes:     General: No scleral icterus.       Right eye: No discharge.        Left eye: No discharge.     Conjunctiva/sclera: Conjunctivae normal.  Cardiovascular:     Pulses:          Dorsalis pedis pulses are 2+ on the right side and 2+ on the left side.  Pulmonary:     Effort: Pulmonary effort is normal.  Musculoskeletal:     Comments: Tenderness to palpation noted to the anterior aspect of the left knee. No deformity or crepitus. There is some slight overlying soft tissue swelling. Flexion/extension intact. Negative anterior or posterior drawer test. No instability noted on varus or valgus. No tenderness noted to the left tibfib, ankle or foot. No tenderness to palpation noted of RLE.  Full range of motion of right lower extremity with any difficulty.  Negative anterior and posterior drawer test. Non pitting edema noted to BLE. No overlying warmth, erythema. No open wounds.   Skin:    General: Skin is warm and dry.     Comments: Good distal cap refill.  LLE is not dusky in appearance or cool to touch.  Neurological:      Mental Status: He is alert.     Comments: Sensation intact along major nerve distributions of BLE  Psychiatric:        Speech: Speech normal.        Behavior: Behavior normal.     ED Results / Procedures / Treatments   Labs (all labs ordered are listed, but only abnormal results are displayed) Labs Reviewed - No data to display  EKG None  Radiology DG Knee Complete 4 Views Left  Result Date: 01/25/2020 CLINICAL DATA:  Status post patellar dislocation and reduction. EXAM: LEFT KNEE - COMPLETE 4+ VIEW COMPARISON:  None. FINDINGS: No evidence of fracture, dislocation, or joint effusion. No evidence of arthropathy or other  focal bone abnormality. Soft tissues are unremarkable. IMPRESSION: Negative. Electronically Signed   By: Aram Candela M.D.   On: 01/25/2020 21:32    Procedures Procedures (including critical care time)  Medications Ordered in ED Medications  HYDROcodone-acetaminophen (NORCO/VICODIN) 5-325 MG per tablet 1 tablet (1 tablet Oral Given 01/25/20 2143)    ED Course  I have reviewed the triage vital signs and the nursing notes.  Pertinent labs & imaging results that were available during my care of the patient were reviewed by me and considered in my medical decision making (see chart for details).    MDM Rules/Calculators/A&P                          34 y.o. M who presents for evaluation of left knee pain after a mechanical fall.  He reports that he was dancing at a wedding state that his knee gave out.  His kneecap dislocated to the lateral side.  His girlfriend put it back in place.  He is still having some pain but able to ambulate bear weight.  Initially arrival he is afebrile nontoxic-appearing.  Vital signs are stable.  On exam, he is neurovascular intact.  He does have some swelling noted around the knee.  No deformity crepitus noted.  Suspect this with patellar dislocation. Patient with good distal pulses and cap refill.  Will obtain XR.   Knee x-ray  shows no evidence of fracture, dislocation or joint effusion.  Discussed results with patient.  I discussed with him that there still could be underlying tendon, ligament injury, particularly with his patellar dislocation.  We will plan to put him in a knee immobilizer and have him use crutches.  Patient instructed to follow-up with orthopedics.  At this time, patient with good distal pulses.  He has good cap refill in the lower left extremity.  Stable for discharge at this time. At this time, patient exhibits no emergent life-threatening condition that require further evaluation in ED. Patient had ample opportunity for questions and discussion. All patient's questions were answered with full understanding. Strict return precautions discussed. Patient expresses understanding and agreement to plan.   Portions of this note were generated with Scientist, clinical (histocompatibility and immunogenetics). Dictation errors may occur despite best attempts at proofreading.   Final Clinical Impression(s) / ED Diagnoses Final diagnoses:  Acute pain of left knee    Rx / DC Orders ED Discharge Orders    None       Rosana Hoes 01/25/20 2237    Tilden Fossa, MD 01/25/20 2238

## 2020-01-27 ENCOUNTER — Ambulatory Visit (INDEPENDENT_AMBULATORY_CARE_PROVIDER_SITE_OTHER): Payer: BC Managed Care – PPO

## 2020-01-27 ENCOUNTER — Other Ambulatory Visit: Payer: Self-pay

## 2020-01-27 ENCOUNTER — Encounter: Payer: Self-pay | Admitting: Podiatry

## 2020-01-27 ENCOUNTER — Ambulatory Visit (INDEPENDENT_AMBULATORY_CARE_PROVIDER_SITE_OTHER): Payer: BC Managed Care – PPO | Admitting: Podiatry

## 2020-01-27 DIAGNOSIS — L97421 Non-pressure chronic ulcer of left heel and midfoot limited to breakdown of skin: Secondary | ICD-10-CM

## 2020-01-27 DIAGNOSIS — E11621 Type 2 diabetes mellitus with foot ulcer: Secondary | ICD-10-CM | POA: Diagnosis not present

## 2020-01-27 DIAGNOSIS — L03119 Cellulitis of unspecified part of limb: Secondary | ICD-10-CM

## 2020-01-27 DIAGNOSIS — L02619 Cutaneous abscess of unspecified foot: Secondary | ICD-10-CM

## 2020-01-27 MED ORDER — DOXYCYCLINE HYCLATE 100 MG PO TABS: 100.0000 mg | ORAL_TABLET | Freq: Two times a day (BID) | ORAL | 0 refills | Status: DC

## 2020-01-28 ENCOUNTER — Encounter (HOSPITAL_BASED_OUTPATIENT_CLINIC_OR_DEPARTMENT_OTHER): Payer: BC Managed Care – PPO | Admitting: Internal Medicine

## 2020-01-28 DIAGNOSIS — E11621 Type 2 diabetes mellitus with foot ulcer: Secondary | ICD-10-CM | POA: Diagnosis not present

## 2020-01-29 LAB — WOUND CULTURE
MICRO NUMBER:: 11113856
SPECIMEN QUALITY:: ADEQUATE

## 2020-01-30 NOTE — Progress Notes (Signed)
[name], Max ([id number]) Visit Report for 01/28/2020 Debridement Details Patient Name: Date of Service: [name] 01/28/2020 3:45 PM Medical Record Number: [id number] Patient Account Number: [id number] Date of Birth/Sex: Treating RN: 12/03/1985 (34 y.o. [name], [name] Primary Care Provider: Seward [name] Other Clinician: Referring Provider: Treating Provider/Extender: [name] in Treatment: 13 Debridement Performed for Assessment: Wound #3 Left,Lateral Foot Performed By: Physician [name]., MD Debridement Type: Debridement Severity of Tissue Pre Debridement: Fat layer exposed Level of Consciousness (Pre-procedure): Awake and Alert Pre-procedure Verification/Time Out Yes - 15:55 Taken: Start Time: 15:56 Pain Control: Lidocaine 4% T opical Solution T Area Debrided (L x W): otal 1.5 (cm) x 1.9 (cm) = 2.85 (cm) Tissue and other material debrided: Viable, Non-Viable, Skin: Dermis , Skin: Epidermis Level: Skin/Epidermis Debridement Description: Selective/Open Wound Instrument: Curette Bleeding: Minimum Hemostasis Achieved: Pressure End Time: 16:00 Procedural Pain: 0 Post Procedural Pain: 0 Response to Treatment: Procedure was tolerated well Level of Consciousness (Post- Awake and Alert procedure): Post Debridement Measurements of Total Wound Length: (cm) 1.5 Width: (cm) 1.9 Depth: (cm) 0.6 Volume: (cm) 1.343 Character of Wound/Ulcer Post Debridement: Improved Severity of Tissue Post Debridement: Fat layer exposed Post Procedure Diagnosis Same as Pre-procedure Electronic Signature(s) Signed: 01/28/2020 4:47:47 PM By: Max Ham MD Signed: 01/30/2020 5:24:43 PM By: [name] Entered By: [name] on 01/28/2020 16:29:27 -------------------------------------------------------------------------------- HPI Details Patient Name: Date of Service: [name], [name] 01/28/2020 3:45 PM Medical Record Number:  [id number] Patient Account Number: [id number] Date of Birth/Sex: Treating RN: 18-Jul-1985 (34 y.o. 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 He comes in for review of a wound over the plantar fifth metatarsal head extending into the lateral part of the foot. He was followed for this previously by his podiatrist Dr. Cornelius [name]. As the patient tells his story he went to see podiatry first for a swelling he developed on the lateral part of his fifth metatarsal head in May. He states this was "open" by podiatry and the area closed. He was followed up in June and it was again opened callus removed and it closed promptly. There were plans being made for surgery on the fifth metatarsal head in June however his blood sugar was apparently too high for anesthesia. Apparently the area was debrided and opened again in June and it is never closed since. Looking over the records from podiatry I am really not able to follow this. It was clear when he was first seen it was before 5/14 at that point he already had a wound. By 5/17 the ulcer was resolved. I do not see anything about a procedure. On 5/28 noted to have pre-ulcerative moderate keratosis. X-ray noted 1/5 contracted toe and tailor's bunion and metatarsal deformity. On a visit date on 09/28/2018 the dorsal part of the left foot it healed and resolved. There was concern about swelling in his lower extremity he was sent to the ER.. As far as I can tell he was seen in the ER on 7/12 with an ulcer on his left foot. A DVT rule out of the left leg was negative. I do not think I have complete records from podiatry but I am not able to verify the procedures this patient states he had. He  states after the last procedure the wound has never closed although I am not able to follow this in the records I have from podiatry. He has not had a recent x-ray The patient has been using Neosporin on the wound. He is wearing a Darco shoe. He is still very active up on his foot working and exercising. Past medical history; type 2 diabetes ketosis-prone, leg swelling with a negative DVT study in July. Non-smoker ABI in our clinic was 0.85 on the left 10/16; substantial wound on the plantar left fifth met head extending laterally almost to the dorsal fifth MTP. We have been using silver alginate we gave him a Darco forefoot off loader. An x-ray did not show evidence of osteomyelitis did note soft tissue emphysema which I think was due to gas tracking through an open wound. There is no doubt in my mind he requires an MRI 10/23; MRI not booked until 3 November at the earliest this is largely due to his glucose sensor in the right arm. We have been using silver alginate. There has been an improvement 10/29; I am still not exactly sure when his MRI is booked for. He says it is the third but it is the 10th in epic. This definitely needs to be done. He is running a low-grade fever today but no other symptoms. No real improvement in the 1 02/26/2019 patient presents today for a follow-up visit here in our clinic he is last been seen in the clinic on October 29. Subsequently we were working on getting MRI to evaluate and see what exactly was going on and where we would need to go from the standpoint of whether or not he had osteomyelitis and again what treatments were going be required. Subsequently the patient ended up being admitted to the hospital on 02/07/2019 and was discharged on 02/14/2019. This is a somewhat interesting admission with a discharge diagnosis of pneumonia due to COVID-19 although he was positive for COVID-19 when tested at the urgent care but negative x2 when he was actually in the  hospital. With that being said he did have acute respiratory failure with hypoxia and it was noted he also have a left foot ulceration with osteomyelitis. With that being said he did require oxygen for his pneumonia and I level 4 L. He was placed on antivirals and steroids for the COVID-19. He was also transferred to the [facility] at one point. Nonetheless he did subsequently discharged home and since being home has done much better in that regard. The CT angiogram did not show any pulmonary embolism. With regard to the osteomyelitis the patient was placed on vancomycin and Zosyn while in the hospital but has been changed to Augmentin at discharge. It was also recommended that he follow- up with wound care and podiatry. Podiatry however wanted him to see [name] according to the patient prior to them doing anything further. His hemoglobin A1c was 9.9 as noted in the hospital. Have an MRI of the left foot performed while in the hospital on 02/04/2019. 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 There is an overlying plantar open wound noted an abscess tracking back along the lateral aspect of the fifth metatarsal shaft. There is otherwise diffuse cellulitis and mild fasciitis without findings of polymyositis. The patient did have recently pneumonia secondary to COVID-19 I looked in the chart through epic and it does appear that the patient may  need to have an additional x-ray just to ensure everything is cleared and that he has no airspace disease prior to putting him into the [name]. 03/05/2019; patient was readmitted to the clinic last week. He was hospitalized twice for a viral upper respiratory tract infection from 11/1 through 11/4 and then 11/5 through 11/12 ultimately this turned out to be Covid pneumonitis. Although he was discharged on oxygen he is not using it. He says he feels fine. He has no  exercise limitation no cough no sputum. His O2 sat in our clinic today was 100% on room air. 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 He received Vanco and Zosyn in the hospital and then was discharged on 2 weeks of Augmentin. I do not see any relevant cultures. He was supposed to follow-up with infectious disease but I do not see that he has an appointment. 12/8; patient saw Dr. Novella Olive of infectious disease last week. He felt that he had had adequate antibiotic therapy. He did not go to follow-up with Dr. Amalia Hailey of podiatry and I have again talked to him about the pros and cons of this. He does not want to consider a ray amputation of this time. He is aware of the risks of recurrence, migration etc. He started HBO today and tolerated this well. He can complete the Augmentin that I gave him last week. I have looked over the lab work that Dr. Chana Bode ordered his C-reactive protein was 3.3 and his sedimentation rate was 17. The C-reactive protein is never really been measurably that high in this patient 12/15; not much change in the wound today however he has undermining along the lateral part of the foot again more extensively than last week. He has some rims of epithelialization. We have been using silver alginate. He is undergoing hyperbarics but did not dive today 12/18; in for his obligatory first total contact cast change. Unfortunately there was pus coming from the undermining area around his fifth metatarsal head. This was cultured but will preclude reapplication of a cast. He is seen in conjunction with HBO 12/24; patient had staph lugdunensis in the wound in the undermining area laterally last time. We put him on doxycycline which should have covered this. The wound looks better today. I am going to give him another week of doxycycline before reattempting the total contact  cast 12/31; the patient is completing antibiotics. Hemorrhagic debris in the distal part of the wound with some undermining distally. He also had hyper granulation. Extensive debridement with a #5 curette. The infected area that was on the lateral part of the fifth met head is closed over. I do not think he needs any more antibiotics. Patient was seen prior to HBO. Preparations for a total contact cast were made in the cast will be placed post hyperbarics 04/11/19; once again the patient arrives today without complaint. He had been in a cast all week noted that he had heavy drainage this week. This resulted in large raised areas of macerated tissue around the wound 1/14; wound bed looks better slightly smaller. Hydrofera Blue has been changing himself. He had a heavy drainage last week which caused a lot of maceration around the wound so I took him out of a total contact cast he says the drainage is actually better this week He is seen today in conjunction with HBO 1/21; returns to clinic. He was  up in [place] for a day or 2 attending a funeral. He comes back in with the wound larger and with a large area of exposed bone. He had osteomyelitis and septic arthritis of the fifth left metatarsal head while he was in hospital. He received IV antibiotics in the hospital for a prolonged period of time then 3 weeks of Augmentin. Subsequently I gave him 2 weeks of doxycycline for more superficial wound infection. When I saw this last week the wound was smaller the surface of the wound looks satisfactory. 1/28; patient missed hyperbarics today. Bone biopsy I did last time showed Enterococcus faecalis and Staphylococcus lugdunensis . He has a wide area of exposed bone. We are going to use silver alginate as of today. I had another ethical discussion with the patient. 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 In this situation I think the likelihood of healing this is low.  Therefore I have recommended a ray amputation and with the patient's agreement I have referred him to Dr. Doran Durand. The other issue is that his compliance with hyperbarics has been minimal because of his work schedule and given his underlying decision I am going to stop this today READMISSION 10/24/2019 MRI 09/29/2019 left foot IMPRESSION: 1. Apparent skin ulceration inferior and lateral to the 5th metatarsal base with underlying heterogeneous T2 signal and enhancement in the subcutaneous fat. Small peripherally enhancing fluid collections along the plantar and lateral aspects of the 5th metatarsal base suspicious for abscesses. 2. Interval amputation through the mid 5th metatarsal with nonspecific low-level marrow edema and enhancement. Given the proximity to the adjacent soft tissue inflammatory changes, osteomyelitis cannot be excluded. 3. The additional bones appear unremarkable. MRI 09/29/2019 right foot IMPRESSION: 1. Soft tissue ulceration lateral to the 5th MTP joint. There is low-level T2 hyperintensity within the 4th and 5th metatarsal heads and adjacent proximal phalanges without abnormal T1 signal or cortical destruction. These findings are nonspecific and could be seen with early marrow edema, hyperemia or early osteomyelitis. No evidence of septic joint. 2. Mild tenosynovitis and synovial enhancement associated with the extensor digitorum tendons at the level of the midfoot. 3. Diffuse low-level muscular T2 hyperintensity and enhancement, most consistent with diabetic myopathy. LEFT FOOT BONE Methicillin resistant staphylococcus aureus Staphylococcus lugdunensis MIC MIC CIPROFLOXACIN >=8 RESISTANT Resistant <=0.5 SENSI... Sensitive CLINDAMYCIN <=0.25 SENS... Sensitive >=8 RESISTANT Resistant ERYTHROMYCIN >=8 RESISTANT Resistant >=8 RESISTANT Resistant GENTAMICIN <=0.5 SENSI... Sensitive <=0.5 SENSI... Sensitive Inducible Clindamycin NEGATIVE Sensitive NEGATIVE  Sensitive OXACILLIN >=4 RESISTANT Resistant 2 SENSITIVE Sensitive RIFAMPIN <=0.5 SENSI... Sensitive <=0.5 SENSI... Sensitive TETRACYCLINE <=1 SENSITIVE Sensitive <=1 SENSITIVE Sensitive TRIMETH/SULFA <=10 SENSIT Sensitive <=10 SENSIT Sensitive ... Marland Kitchen.. VANCOMYCIN 1 SENSITIVE Sensitive <=0.5 SENSI... Sensitive Right foot bone . Component 3 wk ago Specimen Description BONE Special Requests RIGHT 4 METATARSAL SAMPLE B Gram Stain NO WBC SEEN NO ORGANISMS SEEN Culture RARE METHICILLIN RESISTANT STAPHYLOCOCCUS AUREUS NO ANAEROBES ISOLATED Performed at [facility], [place] [address], [place], [place] [address] Report Status 10/08/2019 FINAL Organism ID, Bacteria METHICILLIN RESISTANT STAPHYLOCOCCUS AUREUS Resulting Agency [facility] Susceptibility Methicillin resistant staphylococcus aureus MIC CIPROFLOXACIN >=8 RESISTANT Resistant CLINDAMYCIN <=0.25 SENS... Sensitive ERYTHROMYCIN >=8 RESISTANT Resistant GENTAMICIN <=0.5 SENSI... Sensitive Inducible Clindamycin NEGATIVE Sensitive OXACILLIN >=4 RESISTANT Resistant RIFAMPIN <=0.5 SENSI... Sensitive TETRACYCLINE <=1 SENSITIVE Sensitive TRIMETH/SULFA <=10 SENSIT Sensitive ... VANCOMYCIN 1 SENSITIVE Sensitive This is a patient we had in clinic earlier this year with a wound over his left fifth metatarsal head. 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 Doran Durand on 05/16/2019. He seems to have developed problems with wounds on his bilateral feet in June 2021 although he may have had problems earlier than this. He was in an urgent care with a right foot ulcer on 09/26/2019 and given a course of doxycycline. This was apparently after having trouble getting into see  orthopedics. He was seen by podiatry on 09/28/2019 noted to have bilateral lower extremity ulcers including the left lateral fifth metatarsal base and the right subfifth met head. It was noted that had purulent drainage at that time. He required hospitalization from 6/20 through 7/2. This was because of worsening right foot wounds. He underwent bilateral operative incision and drainage and bone biopsies bilaterally. Culture results are listed above. He has been referred back to clinic by Dr. Jacqualyn Posey of podiatry. He is also followed by Dr. Megan Salon who saw him yesterday. He was discharged from hospital on Zyvox Flagyl and Levaquin and yesterday changed to doxycycline Flagyl and Levaquin. His inflammatory markers on 6/26 showed a sedimentation rate of 129 and a C-reactive protein of 5. This is improved to 14 and 1.3 respectively. This would indicate improvement. ABIs in our clinic today were 1.23 on the right and 1.20 on the left 11/01/2019 on evaluation today patient appears to be doing fairly well in regard to the wounds on his feet at this point. Fortunately there is no signs of active infection at this time. No fevers, chills, nausea, vomiting, or diarrhea. He currently is seeing infectious disease and still under their care at this point. Subsequently he also has both wounds which she has not been using collagen on as he did not receive that in his packaging he did not call us and let us know that. Apparently that just was missed on the order. Nonetheless we will get that straightened out today. 8/9-Patient returns for bilateral foot wounds, using Prisma with hydrogel moistened dressings, and the wounds appear stable. Patient using surgical shoes, avoiding much pressure or weightbearing as much as possible 8/16; patient has bilateral foot wounds. 1 on the right lateral foot proximally the other is on the left mid lateral foot. Both required debridement of callus and thick skin around the wounds. We  have been using silver collagen 8/27; patient has bilateral lateral foot wounds. The area on the left substantially surrounded by callus and dry skin. This was removed from the wound edge. The underlying wound is small. The area on the right measured somewhat smaller today. 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 Unfortunately I did not update his antibiotics during today's visit. 9/10 I reviewed Dr. Hale Bogus last notes he felt he had completed antibiotics his inflammatory markers were reasonably well controlled. He has a small wound on the lateral left foot and a tiny area on the right which is just above closed. He is using Hydrofera Blue with border foam he has bilateral surgical shoes 9/24; 2 week f/u. doing well. right foot is closed. left foot still undermined. 10/14; right foot remains closed at the fifth met head. The area over the base of the left fifth metatarsal has a small open area but considerable undermining towards the plantar foot. Thick callus  skin around this suggests an adequate pressure relief. We have talked about this. He says he is going to go back into his cam boot. I suggested a total contact cast he did not seem enamored with this suggestion 10/26; left foot base of the fifth metatarsal. Same condition as last time. He has skin over the area with an open wound however the skin is not adherent. He went to see Dr. Earleen Newport who did an x-ray and culture of his foot I have not reviewed the x-ray but the patient was not told anything. He is on doxycycline Electronic Signature(s) Signed: 01/28/2020 4:47:47 PM By: Max Ham MD Entered By: [name] on 01/28/2020 16:30:50 -------------------------------------------------------------------------------- Physical Exam Details Patient Name: Date of Service: [name], [name] 01/28/2020 3:45 PM Medical Record Number: [id number] Patient Account Number:  [id number] Date of Birth/Sex: Treating RN: 01/08/1986 (34 y.o. [name] Primary Care Provider: Seward [name] Other Clinician: Referring Provider: Treating Provider/Extender: [name] in Treatment: 13 Constitutional Sitting or standing Blood Pressure is within target range for patient.. Pulse regular and within target range for patient.Marland Kitchen Respirations regular, non-labored and within target range.. Temperature is normal and within the target range for the patient.Marland Kitchen Appears in no distress. Notes Wound exam; left lateral fifth metatarsal at the base. Small open area again with nonadherent skin spreading towards the bottom of his foot. Using a #15 scalpel and pickups I remove this. There was no bleeding. The tissue underneath this looks healthy there is no evidence of infection and no exposed bone Electronic Signature(s) Signed: 01/28/2020 4:47:47 PM By: Max Ham MD Entered By: [name] on 01/28/2020 16:31:54 -------------------------------------------------------------------------------- Physician Orders Details Patient Name: Date of Service: [name], [name] 01/28/2020 3:45 PM Medical Record Number: [id number] Patient Account Number: [id number] Date of Birth/Sex: Treating RN: [birth year] (34 y.o. [name] Primary Care Provider: Seward [name] Other Clinician: Referring Provider: Treating Provider/Extender: [name] in Treatment: 13 Verbal / Phone Orders: No Diagnosis Coding ICD-10 Coding Code Description E11.621 Type 2 diabetes mellitus with foot ulcer L97.518 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ity L97.528 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ity 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, right ankle and foot B95.62 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 classified  elsewhere Follow-up Appointments ppointment in 2 weeks. - possibility of a cast Return A Dressing Change Frequency Wound #3 Left,Lateral Foot Change Dressing every other day. Skin Barriers/Peri-Wound Care Moisturizing lotion - both legs and feet Wound Cleansing May shower and wash wound with soap and water. - on days that dressing is changed Primary Wound Dressing Wound #3 Left,Lateral Foot Calcium Alginate with Silver Secondary Dressing Wound #3 Left,Lateral Foot Foam - foam donut. Kerlix/Rolled Gauze Dry Gauze Other: - cushion right lateral foot with foam Off-Loading Other: - continuing using the CAM boot. Electronic Signature(s) Signed: 01/28/2020 4:47:47 PM By: Max Ham MD Signed: 01/30/2020 5:24:43 PM By: [name] Entered By: [name] on 01/28/2020 16:04:53 -------------------------------------------------------------------------------- Problem List Details Patient Name: Date of Service: [name], [name] 01/28/2020 3:45 PM Medical Record Number: [id number] Patient Account Number: [id number] Date of Birth/Sex: Treating RN: 1985/09/04 (34 y.o. [name] Primary Care Provider: Seward [name] Other Clinician: Referring Provider: Treating Provider/Extender: [name] in Treatment: 13 Active Problems ICD-10 Encounter Code Description Active Date MDM Diagnosis E11.621 Type 2 diabetes mellitus with foot ulcer  10/24/2019 No Yes L97.518 Non-pressure chronic ulcer of other part of right foot with other specified 10/24/2019 No Yes severity L97.528 Non-pressure chronic ulcer of other part of left foot with other specified 10/24/2019 No Yes severity 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, right ankle and foot 10/24/2019 No Yes B95.62 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 10/24/2019 No Yes classified elsewhere Inactive Problems ICD-10 Code Description Active Date Inactive Date 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 He comes in for review of a wound over the plantar fifth metatarsal head extending into the lateral part of the foot. He was followed for this previously by his podiatrist Dr. Cornelius [name]. As the patient tells his story he went to see podiatry first for a swelling he developed on the lateral part of his fifth metatarsal head in May. He states this was "open" by podiatry and the area closed. He was followed up in June and it was again opened callus removed and it closed promptly. There were plans being made for surgery on the fifth metatarsal head in June however his blood sugar was apparently too high for anesthesia. Apparently the area was debrided and opened again in June and it is never closed since. Looking over the records from podiatry I am really not able to follow this. It was clear when he was first seen it was before 5/14 at that point he already had a wound. By 5/17 the ulcer was resolved. I do not see anything about a procedure. On 5/28 noted to have pre-ulcerative moderate keratosis. X-ray noted 1/5 contracted toe and tailor's bunion and metatarsal  deformity. On a visit date on 09/28/2018 the dorsal part of the left foot it healed and resolved. There was concern about swelling in his lower extremity he was sent to the ER.. As far as I can tell he was seen in the ER on 7/12 with an ulcer on his left foot. A DVT rule out of the left leg was negative. I do not think I have complete records from podiatry but I am not able to verify the procedures this patient states he had. He states after the last procedure the wound has never closed although I am not able to follow this in the records I have from podiatry. He has not had a recent x-ray The patient has been using Neosporin on the wound. He is wearing a Darco shoe. He is still very active up on his foot working and exercising. Past medical history; type 2 diabetes ketosis-prone, leg swelling with a negative DVT study in July. Non-smoker ABI in our clinic was 0.85 on the left 10/16; substantial wound on the plantar left fifth met head extending laterally almost to the dorsal fifth MTP. We have been using silver alginate we gave him a Darco forefoot off loader. An x-ray did not show evidence of osteomyelitis did note soft tissue emphysema which I think  was due to gas tracking through an open wound. There is no doubt in my mind he requires an MRI 10/23; MRI not booked until 3 November at the earliest this is largely due to his glucose sensor in the right arm. We have been using silver alginate. There has been an improvement 10/29; I am still not exactly sure when his MRI is booked for. He says it is the third but it is the 10th in epic. This definitely needs to be done. He is running a low-grade fever today but no other symptoms. No real improvement in the 1 02/26/2019 patient presents today for a follow-up visit here in our clinic he is last been seen in the clinic on October 29. Subsequently we were working on getting MRI to evaluate and see what exactly was going on and where we would need to go  from the standpoint of whether or not he had osteomyelitis and again what treatments were going be required. Subsequently the patient ended up being admitted to the hospital on 02/07/2019 and was discharged on 02/14/2019. This is a somewhat interesting admission with a discharge diagnosis of pneumonia due to COVID-19 although he was positive for COVID-19 when tested at the urgent care but negative x2 when he was actually in the hospital. With that being said he did have acute respiratory failure with hypoxia and it was noted he also have a left foot ulceration with osteomyelitis. With that being said he did require oxygen for his pneumonia and I level 4 L. He was placed on antivirals and steroids for the COVID-19. He was also transferred to the [facility] at one point. Nonetheless he did subsequently discharged home and since being home has done much better in that regard. The CT angiogram did not show any pulmonary embolism. With regard to the osteomyelitis the patient was placed on vancomycin and Zosyn while in the hospital but has been changed to Augmentin at discharge. It was also recommended that he follow- up with wound care and podiatry. Podiatry however wanted him to see [name] according to the patient prior to them doing anything further. His hemoglobin A1c was 9.9 as noted in the hospital. Have an MRI of the left foot performed while in the hospital on 02/04/2019. 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 There is an overlying plantar open wound noted an abscess tracking back along the lateral aspect of the fifth metatarsal shaft. There is otherwise diffuse cellulitis and mild fasciitis without findings of polymyositis. The patient did have recently pneumonia secondary to COVID-19 I looked in the chart through epic and it does appear that the patient may need to have an additional x-ray just to  ensure everything is cleared and that he has no airspace disease prior to putting him into the [name]. 03/05/2019; patient was readmitted to the clinic last week. He was hospitalized twice for a viral upper respiratory tract infection from 11/1 through 11/4 and then 11/5 through 11/12 ultimately this turned out to be Covid pneumonitis. Although he was discharged on oxygen he is not using it. He says he feels fine. He has no exercise limitation no cough no sputum. His O2 sat in our clinic today was 100% on room air. 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 He received Vanco and Zosyn in  the hospital and then was discharged on 2 weeks of Augmentin. I do not see any relevant cultures. He was supposed to follow-up with infectious disease but I do not see that he has an appointment. 12/8; patient saw Dr. Novella Olive of infectious disease last week. He felt that he had had adequate antibiotic therapy. He did not go to follow-up with Dr. Amalia Hailey of podiatry and I have again talked to him about the pros and cons of this. He does not want to consider a ray amputation of this time. He is aware of the risks of recurrence, migration etc. He started HBO today and tolerated this well. He can complete the Augmentin that I gave him last week. I have looked over the lab work that Dr. Chana Bode ordered his C-reactive protein was 3.3 and his sedimentation rate was 17. The C-reactive protein is never really been measurably that high in this patient 12/15; not much change in the wound today however he has undermining along the lateral part of the foot again more extensively than last week. He has some rims of epithelialization. We have been using silver alginate. He is undergoing hyperbarics but did not dive today 12/18; in for his obligatory first total contact cast change. Unfortunately there was pus coming from  the undermining area around his fifth metatarsal head. This was cultured but will preclude reapplication of a cast. He is seen in conjunction with HBO 12/24; patient had staph lugdunensis in the wound in the undermining area laterally last time. We put him on doxycycline which should have covered this. The wound looks better today. I am going to give him another week of doxycycline before reattempting the total contact cast 12/31; the patient is completing antibiotics. Hemorrhagic debris in the distal part of the wound with some undermining distally. He also had hyper granulation. Extensive debridement with a #5 curette. The infected area that was on the lateral part of the fifth met head is closed over. I do not think he needs any more antibiotics. Patient was seen prior to HBO. Preparations for a total contact cast were made in the cast will be placed post hyperbarics 04/11/19; once again the patient arrives today without complaint. He had been in a cast all week noted that he had heavy drainage this week. This resulted in large raised areas of macerated tissue around the wound 1/14; wound bed looks better slightly smaller. Hydrofera Blue has been changing himself. He had a heavy drainage last week which caused a lot of maceration around the wound so I took him out of a total contact cast he says the drainage is actually better this week He is seen today in conjunction with HBO 1/21; returns to clinic. He was up in [place] for a day or 2 attending a funeral. He comes back in with the wound larger and with a large area of exposed bone. He had osteomyelitis and septic arthritis of the fifth left metatarsal head while he was in hospital. He received IV antibiotics in the hospital for a prolonged period of time then 3 weeks of Augmentin. Subsequently I gave him 2 weeks of doxycycline for more superficial wound infection. When I saw this last week the wound was smaller the surface of the wound looks  satisfactory. 1/28; patient missed hyperbarics today. Bone biopsy I did last time showed Enterococcus faecalis and Staphylococcus lugdunensis . He has a wide area of exposed bone. We are going to use silver alginate as of today. I had another ethical  discussion with the patient. 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 In this situation I think the likelihood of healing this is low. Therefore I have recommended a ray amputation and with the patient's agreement I have referred him to Dr. Doran Durand. The other issue is that his compliance with hyperbarics has been minimal because of his work schedule and given his underlying decision I am going to stop this today READMISSION 10/24/2019 MRI 09/29/2019 left foot IMPRESSION: 1. Apparent skin ulceration inferior and lateral to the 5th metatarsal base with underlying heterogeneous T2 signal and enhancement in the subcutaneous fat. Small peripherally enhancing fluid collections along the plantar and lateral aspects of the 5th metatarsal base suspicious for abscesses. 2. Interval amputation through the mid 5th metatarsal with nonspecific low-level marrow edema and enhancement. Given the proximity to the adjacent soft tissue inflammatory changes, osteomyelitis cannot be excluded. 3. The additional bones appear unremarkable. MRI 09/29/2019 right foot IMPRESSION: 1. Soft tissue ulceration lateral to the 5th MTP joint. There is low-level T2 hyperintensity within the 4th and 5th metatarsal heads and adjacent proximal phalanges without abnormal T1 signal or cortical destruction. These findings are nonspecific and could be seen with early marrow edema, hyperemia or early osteomyelitis. No evidence of septic joint. 2. Mild tenosynovitis and synovial enhancement associated with the extensor digitorum tendons at the level of the midfoot. 3. Diffuse low-level muscular T2 hyperintensity and enhancement, most consistent with  diabetic myopathy. LEFT FOOT BONE Methicillin resistant staphylococcus aureus Staphylococcus lugdunensis MIC MIC CIPROFLOXACIN >=8 RESISTANT Resistant <=0.5 SENSI... Sensitive CLINDAMYCIN <=0.25 SENS... Sensitive >=8 RESISTANT Resistant ERYTHROMYCIN >=8 RESISTANT Resistant >=8 RESISTANT Resistant GENTAMICIN <=0.5 SENSI... Sensitive <=0.5 SENSI... Sensitive Inducible Clindamycin NEGATIVE Sensitive NEGATIVE Sensitive OXACILLIN >=4 RESISTANT Resistant 2 SENSITIVE Sensitive RIFAMPIN <=0.5 SENSI... Sensitive <=0.5 SENSI... Sensitive TETRACYCLINE <=1 SENSITIVE Sensitive <=1 SENSITIVE Sensitive TRIMETH/SULFA <=10 SENSIT Sensitive <=10 SENSIT Sensitive ... Marland Kitchen.. VANCOMYCIN 1 SENSITIVE Sensitive <=0.5 SENSI... Sensitive Right foot bone . Component 3 wk ago Specimen Description BONE Special Requests RIGHT 4 METATARSAL SAMPLE B Gram Stain NO WBC SEEN NO ORGANISMS SEEN Culture RARE METHICILLIN RESISTANT STAPHYLOCOCCUS AUREUS NO ANAEROBES ISOLATED Performed at [facility], [place] [address] [place], [place] [address] Report Status 10/08/2019 FINAL Organism ID, Bacteria METHICILLIN RESISTANT STAPHYLOCOCCUS AUREUS Resulting Agency [facility] Susceptibility Methicillin resistant staphylococcus aureus MIC CIPROFLOXACIN >=8 RESISTANT Resistant CLINDAMYCIN <=0.25 SENS... Sensitive ERYTHROMYCIN >=8 RESISTANT Resistant GENTAMICIN <=0.5 SENSI... Sensitive Inducible Clindamycin NEGATIVE Sensitive OXACILLIN >=4 RESISTANT Resistant RIFAMPIN <=0.5 SENSI... Sensitive TETRACYCLINE <=1 SENSITIVE Sensitive TRIMETH/SULFA <=10 SENSIT Sensitive ... VANCOMYCIN 1 SENSITIVE Sensitive This is a patient we had in clinic earlier this year with a wound over his left fifth metatarsal head. 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 Doran Durand on 05/16/2019. He seems to have developed problems with wounds on his bilateral feet in June 2021 although he may have had problems earlier than this. He was in an urgent care with a right foot ulcer on 09/26/2019 and given a course of doxycycline. This was apparently after having trouble getting into see orthopedics. He was seen by podiatry on 09/28/2019 noted to have bilateral lower extremity ulcers including the left lateral fifth metatarsal base and the right subfifth met head. It was  noted that had purulent drainage at that time. He required hospitalization from 6/20 through 7/2. This was because of worsening right foot wounds. He underwent bilateral operative incision and drainage and bone biopsies bilaterally. Culture results are listed above. He has been referred back to clinic by Dr. Jacqualyn Posey of podiatry. He is also followed by Dr. Megan Salon who saw him yesterday. He was discharged from hospital on Zyvox Flagyl and Levaquin and yesterday changed to doxycycline Flagyl and Levaquin. His inflammatory markers on 6/26 showed a sedimentation rate of 129 and a C-reactive protein of 5. This is improved to 14 and 1.3 respectively. This would indicate improvement. ABIs in our clinic today were 1.23 on the right and 1.20 on the left 11/01/2019 on evaluation today patient appears to be doing fairly well in regard to the wounds on his feet at this point. Fortunately there is no signs of active infection at this time. No fevers, chills, nausea, vomiting, or diarrhea. He currently is seeing infectious disease and still under their care at this point. Subsequently he also has both wounds which she has not been using collagen on as he did not receive that in his packaging he did not call us and let us know that. Apparently that just was missed on the order. Nonetheless we will get that straightened out  today. 8/9-Patient returns for bilateral foot wounds, using Prisma with hydrogel moistened dressings, and the wounds appear stable. Patient using surgical shoes, avoiding much pressure or weightbearing as much as possible 8/16; patient has bilateral foot wounds. 1 on the right lateral foot proximally the other is on the left mid lateral foot. Both required debridement of callus and thick skin around the wounds. We have been using silver collagen 8/27; patient has bilateral lateral foot wounds. The area on the left substantially surrounded by callus and dry skin. This was removed from the wound edge. The underlying wound is small. The area on the right measured somewhat smaller today. 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 Unfortunately I did not update his antibiotics during today's visit. 9/10 I reviewed Dr. Hale Bogus last notes he felt he had completed antibiotics his inflammatory markers were reasonably well controlled. He has a small wound on the lateral left foot and a tiny area on the right which is just above closed. He is using Hydrofera Blue with border foam he has bilateral surgical shoes 9/24; 2 week f/u. doing well. right foot is closed. left foot still undermined. 10/14; right foot remains closed at the fifth met head. The area over the base of the left fifth metatarsal has a small open area but considerable undermining towards the plantar foot. Thick callus skin around this suggests an adequate pressure relief. We have talked about this. He says he is going to go back into his cam boot. I suggested a total contact cast he did not seem enamored with this suggestion 10/26; left foot base of the fifth metatarsal. Same condition as last time. He has skin over the area with an open wound however the skin is not adherent. He went to see Dr. Earleen Newport who did an x-ray and culture of his foot I have not reviewed the x-ray but  the patient was not told anything. He is on doxycycline Objective Constitutional Sitting or standing Blood Pressure is within target range for patient.. Pulse regular and within target range for patient.Marland Kitchen Respirations regular, non-labored and within target range.. Temperature is normal and  within the target range for the patient.Marland Kitchen Appears in no distress. Vitals Time Taken: 3:30 PM, Height: 77 in, Weight: 280 lbs, BMI: 33.2, Temperature: 98.4 F, Pulse: 103 bpm, Respiratory Rate: 18 breaths/min, Blood Pressure: 140/86 mmHg. General Notes: Wound exam; left lateral fifth metatarsal at the base. Small open area again with nonadherent skin spreading towards the bottom of his foot. Using a #15 scalpel and pickups I remove this. There was no bleeding. The tissue underneath this looks healthy there is no evidence of infection and no exposed bone Integumentary (Hair, Skin) Wound #3 status is Open. Original cause of wound was Trauma. The wound is located on the Left,Lateral Foot. The wound measures 1.5cm length x 1.9cm width x 0.6cm depth; 2.238cm^2 area and 1.343cm^3 volume. There is Fat Layer (Subcutaneous Tissue) exposed. There is no tunneling or undermining noted. There is a medium amount of serosanguineous drainage noted. The wound margin is flat and intact. There is large (67-100%) red, pink granulation within the wound bed. There is a small (1-33%) amount of necrotic tissue within the wound bed including Adherent Slough. 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 There was a Selective/Open Wound Skin/Epidermis Debridement with a total area of 2.85 sq cm performed by [name]., MD. With the following instrument(s): Curette to remove Viable and Non-Viable tissue/material. Material removed includes Skin: Dermis and Skin: Epidermis and after achieving pain control using Lidocaine 4% Topical Solution. A time out was conducted at 15:55, prior to the start of the procedure. A Minimum amount of bleeding was controlled with Pressure. The procedure was tolerated well with a pain level of 0 throughout and a pain level of 0 following the procedure. Post Debridement Measurements: 1.5cm length x 1.9cm width x 0.6cm depth; 1.343cm^3 volume. Character of Wound/Ulcer Post Debridement is improved. Severity of Tissue Post Debridement is: Fat layer exposed. Post procedure Diagnosis Wound #3: Same as Pre-Procedure Plan Follow-up Appointments: Return Appointment in 2 weeks. - possibility of a cast Dressing Change Frequency: Wound #3 Left,Lateral Foot: Change Dressing every other day. Skin Barriers/Peri-Wound Care: Moisturizing lotion - both legs and feet Wound Cleansing: May shower and wash wound with soap and water. - on days that dressing is changed Primary Wound Dressing: Wound #3 Left,Lateral Foot: Calcium Alginate with Silver Secondary Dressing: Wound #3 Left,Lateral Foot: Foam - foam donut. Kerlix/Rolled Gauze Dry Gauze Other: - cushion right lateral foot with foam Off-Loading: Other: - continuing using the CAM boot. #1 I change the primary dressing to silver alginate 2. He was on doxycycline as prescribed by Dr. Earleen Newport. There will be a culture pending and also an x-ray I can look at through [facility] health link. 3. Again I have talked to the patient. I think this is all an offloading issue. I have again suggested a total contact cast he wants  to wait for 2 weeks which is fine. 4. I do not see any evidence of infection here although the area is certainly no better Electronic Signature(s) Signed: 01/28/2020 4:47:47 PM By: Max Ham MD Entered By:  [name] on 01/28/2020 16:33:08 -------------------------------------------------------------------------------- SuperBill Details Patient Name: Date of Service: [name] 01/28/2020 Medical Record Number: [id number] Patient Account Number: [id number] Date of Birth/Sex: Treating RN: [birth year] (34 y.o. [name], [name] Primary Care Provider: Seward [name] Other Clinician: Referring Provider: Treating Provider/Extender: [name] in Treatment: 13 Diagnosis Coding ICD-10 Codes Code Description E11.621 Type 2 diabetes mellitus with foot ulcer L97.518 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ity L97.528 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ity 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, right ankle and foot B95.62 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 classified elsewhere Facility Procedures CPT4 Code: [id number] Description: [phone] - DEBRIDE WOUND 1ST 20 SQ CM OR < ICD-10 Diagnosis Description L97.528 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it E11.621 Type 2 diabetes mellitus with foot ulcer Modifier: y Quantity: 1 Physician Procedures : CPT4 Code Description Modifier [id number] - WC PHYS DEBR WO ANESTH 20 SQ CM ICD-10 Diagnosis Description L97.528 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ity E11.621 Type 2 diabetes mellitus with foot ulcer Quantity: 1 Electronic Signature(s) Signed: 01/28/2020 4:47:47 PM By: Max Ham MD Entered By: [name] on 01/28/2020 16:33:31

## 2020-01-30 NOTE — Progress Notes (Signed)
[place], [name] ([id number]) Visit Report for 01/28/2020 Arrival Information Details Patient Name: Date of Service: [name] 01/28/2020 3:45 PM Medical Record Number: [id number] Patient Account Number: [id number] Date of Birth/Sex: Treating RN: [birth year] (34 y.o. [name], [name] Primary Care Zarah Carbon: [name] Other Clinician: Referring Jasmarie Coppock: Treating [name]/Extender: Gwyneth Revels in Treatment: 13 Visit Information History Since Last Visit Added or deleted any medications: No Patient Arrived: Ambulatory Any new allergies or adverse reactions: No Arrival Time: 15:27 Had a fall or experienced change in No Accompanied By: self activities of daily living that may affect Transfer Assistance: None risk of falls: Patient Identification Verified: Yes Signs or symptoms of abuse/neglect since last visito No Secondary Verification Process Completed: Yes Hospitalized since last visit: No Patient Requires Transmission-Based Precautions: No Implantable device outside of the clinic excluding No Patient Has Alerts: No cellular tissue based products placed in the center since last visit: Has Dressing in Place as Prescribed: Yes Pain Present Now: No Electronic Signature(s) Signed: 01/28/2020 3:39:47 PM By: Karl Ito Entered By: Karl Ito on 01/28/2020 15:30:11 -------------------------------------------------------------------------------- Encounter Discharge Information Details Patient Name: Date of Service: [name], [name] 01/28/2020 3:45 PM Medical Record Number: [id number] Patient Account Number: [id number] Date of Birth/Sex: Treating RN: 1985-11-04 (34 y.o. [name] Primary Care Margot Oriordan: [name] Other Clinician: Referring Dresden Ament: Treating Gisselle Galvis/Extender: Gwyneth Revels in Treatment: 13 Encounter Discharge Information Items Post Procedure Vitals Discharge Condition:  Stable Temperature (F): 98.4 Ambulatory Status: Ambulatory Pulse (bpm): 103 Discharge Destination: Home Respiratory Rate (breaths/min): 18 Transportation: Private Auto Blood Pressure (mmHg): 140/86 Accompanied By: alone Schedule Follow-up Appointment: Yes Clinical Summary of Care: Patient Declined Electronic Signature(s) Signed: 01/28/2020 5:15:49 PM By: Zandra Abts RN, BSN Entered By: Zandra Abts on 01/28/2020 17:08:22 -------------------------------------------------------------------------------- Lower Extremity Assessment Details Patient Name: Date of Service: [name], [name] 01/28/2020 3:45 PM Medical Record Number: [id number] Patient Account Number: [id number] Date of Birth/Sex: Treating RN: 05-20-1985 (34 y.o. [name] Primary Care Araiya Tilmon: [name] Other Clinician: Referring Jessalyn Hinojosa: Treating Sher Hellinger/Extender: Gwyneth Revels in Treatment: 13 Edema Assessment Assessed: [name]: No] Franne Forts: No] Edema: [Left: No] [Right: No] Calf Left: Right: Point of Measurement: 48 cm From Medial Instep 51 cm 48 cm Ankle Left: Right: Point of Measurement: 10 cm From Medial Instep 31 cm 31 cm Vascular Assessment Pulses: Dorsalis Pedis Palpable: [Left:Yes] Electronic Signature(s) Signed: 01/28/2020 5:15:49 PM By: Zandra Abts RN, BSN Entered By: Zandra Abts on 01/28/2020 15:44:42 -------------------------------------------------------------------------------- Multi Wound Chart Details Patient Name: Date of Service: [name], [name] 01/28/2020 3:45 PM Medical Record Number: [id number] Patient Account Number: [id number] Date of Birth/Sex: Treating RN: 12-27-1985 (34 y.o. [name] Primary Care Lakisa Lotz: [name] Other Clinician: Referring Sloan Galentine: Treating Farhana Fellows/Extender: Gwyneth Revels in Treatment: 13 Vital Signs Height(in): 77 Pulse(bpm): 103 Weight(lbs): 280 Blood  Pressure(mmHg): 140/86 Body Mass Index(BMI): 33 Temperature(F): 98.4 Respiratory Rate(breaths/min): 18 Photos: [3:No Photos Left, Lateral Foot] [N/A:N/A N/A] Wound Location: [3:Trauma] [N/A:N/A] Wounding Event: [3:Diabetic Wound/Ulcer of the Lower] [N/A:N/A] Primary Etiology: [3:Extremity Type II Diabetes] [N/A:N/A] Comorbid History: [3:10/02/2019] [N/A:N/A] Date Acquired: [3:13] [N/A:N/A] Weeks of Treatment: [3:Open] [N/A:N/A] Wound Status: [3:1.5x1.9x0.6] [N/A:N/A] Measurements L x W x [name] [3:2.238] [N/A:N/A] A (cm) : rea [3:1.343] [N/A:N/A] Volume (cm) : [3:-35.70%] [N/A:N/A] % Reduction in A rea: [3:-713.90%] [N/A:N/A] % Reduction in Volume: [3:Grade 2] [N/A:N/A] Classification: [3:Medium] [N/A:N/A] Exudate A mount: [3:Serosanguineous] [N/A:N/A] Exudate Type: [3:red, brown] [N/A:N/A]  Exudate Color: [3:Flat and Intact] [N/A:N/A] Wound Margin: [3:Large (67-100%)] [N/A:N/A] Granulation A mount: [3:Red, Pink] [N/A:N/A] Granulation Quality: [3:Small (1-33%)] [N/A:N/A] Necrotic A mount: [3:Fat Layer (Subcutaneous Tissue): Yes N/A] Exposed Structures: [3:Fascia: No Tendon: No Muscle: No Joint: No Bone: No Small (1-33%)] [N/A:N/A] Epithelialization: [3:Debridement - Selective/Open Wound N/A] Debridement: Pre-procedure Verification/Time Out 15:55 [N/A:N/A] Taken: [3:Lidocaine 4% Topical Solution] [N/A:N/A] Pain Control: [3:Skin/Epidermis] [N/A:N/A] Level: [3:2.85] [N/A:N/A] Debridement A (sq cm): [3:rea Curette] [N/A:N/A] Instrument: [3:Minimum] [N/A:N/A] Bleeding: [3:Pressure] [N/A:N/A] Hemostasis A chieved: [3:0] [N/A:N/A] Procedural Pain: [3:0] [N/A:N/A] Post Procedural Pain: [3:Procedure was tolerated well] [N/A:N/A] Debridement Treatment Response: [3:1.5x1.9x0.6] [N/A:N/A] Post Debridement Measurements L x W x [name] [3:1.343] [N/A:N/A] Post Debridement Volume: (cm) [3:Debridement] [N/A:N/A] Treatment Notes Electronic Signature(s) Signed: 01/28/2020 4:47:47 PM  By: Baltazar Najjar MD Signed: 01/30/2020 5:24:43 PM By: Shawn Stall Entered By: Baltazar Najjar on 01/28/2020 16:29:19 -------------------------------------------------------------------------------- Multi-Disciplinary Care Plan Details Patient Name: Date of Service: [name], [name] 01/28/2020 3:45 PM Medical Record Number: [id number] Patient Account Number: [id number] Date of Birth/Sex: Treating RN: 18-Oct-1985 (34 y.o. [name] Primary Care Morio Widen: [name] Other Clinician: Referring Shevette Bess: Treating Christon Parada/Extender: Gwyneth Revels in Treatment: 13 Active Inactive Nutrition Nursing Diagnoses: Imbalanced nutrition Potential for alteratiion in Nutrition/Potential for imbalanced nutrition Goals: Patient/caregiver agrees to and verbalizes understanding of need to use nutritional supplements and/or vitamins as prescribed Date Initiated: 10/24/2019 Target Resolution Date: 03/06/2020 Goal Status: Active Patient/caregiver will maintain therapeutic glucose control Date Initiated: 10/24/2019 Target Resolution Date: 02/28/2020 Goal Status: Active Interventions: Assess HgA1c results as ordered upon admission and as needed Assess patient nutrition upon admission and as needed per policy Provide education on elevated blood sugars and impact on wound healing Provide education on nutrition Treatment Activities: Education provided on Nutrition : 12/13/2019 Notes: Electronic Signature(s) Signed: 01/30/2020 5:24:43 PM By: Shawn Stall Entered By: Shawn Stall on 01/28/2020 15:28:39 -------------------------------------------------------------------------------- Pain Assessment Details Patient Name: Date of Service: [name] 01/28/2020 3:45 PM Medical Record Number: [id number] Patient Account Number: [id number] Date of Birth/Sex: Treating RN: 02/17/1986 (34 y.o. [name] Primary Care Courtlyn Aki: [name] Other  Clinician: Referring Lando Alcalde: Treating Linsey Arteaga/Extender: Gwyneth Revels in Treatment: 13 Active Problems Location of Pain Severity and Description of Pain Patient Has Paino No Site Locations Pain Management and Medication Current Pain Management: Electronic Signature(s) Signed: 01/28/2020 3:39:47 PM By: Karl Ito Signed: 01/30/2020 5:24:43 PM By: Shawn Stall Entered By: Karl Ito on 01/28/2020 15:30:34 -------------------------------------------------------------------------------- Patient/Caregiver Education Details Patient Name: Date of Service: [name] 10/26/2021andnbsp3:45 PM Medical Record Number: [id number] Patient Account Number: [id number] Date of Birth/Gender: Treating RN: [birth year] (34 y.o. [name] Primary Care Physician: [name] Other Clinician: Referring Physician: Treating Physician/Extender: Gwyneth Revels in Treatment: 13 Education Assessment Education Provided To: Patient Education Topics Provided Elevated Blood Sugar/ Impact on Healing: Handouts: Elevated Blood Sugars: How Do They Affect Wound Healing Methods: Explain/Verbal, Printed Responses: Reinforcements needed Electronic Signature(s) Signed: 01/30/2020 5:24:43 PM By: Shawn Stall Entered By: Shawn Stall on 01/28/2020 15:28:55 -------------------------------------------------------------------------------- Wound Assessment Details Patient Name: Date of Service: [name] 01/28/2020 3:45 PM Medical Record Number: [id number] Patient Account Number: [id number] Date of Birth/Sex: Treating RN: Dec 06, 1985 (34 y.o. [name] Primary Care Martyna Thorns: [name] Other Clinician: Referring Dellis Voght: Treating Queenie Aufiero/Extender: Gwyneth Revels in Treatment: 13 Wound Status Wound Number: 3 Primary Etiology: Diabetic Wound/Ulcer of the Lower Extremity Wound Location: Left,  Lateral Foot Wound Status: Open Wounding Event: Trauma Comorbid History: Type II Diabetes Date Acquired: 10/02/2019 Weeks Of Treatment: 13 Clustered Wound: No Photos Photo Uploaded By: [name] on 01/29/2020 11:39:28 Wound Measurements Length: (cm) 1.5 Width: (cm) 1.9 Depth: (cm) 0.6 Area: (cm) 2.238 Volume: (cm) 1.343 % Reduction in Area: -35.7% % Reduction in Volume: -713.9% Epithelialization: Small (1-33%) Tunneling: No Undermining: No Wound Description Classification: Grade 2 Wound Margin: Flat and Intact Exudate Amount: Medium Exudate Type: Serosanguineous Exudate Color: red, brown Foul Odor After Cleansing: No Slough/Fibrino No Wound Bed Granulation Amount: Large (67-100%) Exposed Structure Granulation Quality: Red, Pink Fascia Exposed: No Necrotic Amount: Small (1-33%) Fat Layer (Subcutaneous Tissue) Exposed: Yes Necrotic Quality: Adherent Slough Tendon Exposed: No Muscle Exposed: No Joint Exposed: No Bone Exposed: No Treatment Notes Wound #3 (Left, Lateral Foot) 1. Cleanse With Wound Cleanser 3. Primary Dressing Applied Calcium Alginate Ag 4. Secondary Dressing Dry Gauze Roll Gauze Foam 5. Secured With Tape Notes foam donut Electronic Signature(s) Signed: 01/28/2020 5:15:49 PM By: Zandra Abts RN, BSN Signed: 01/30/2020 5:24:43 PM By: Shawn Stall Previous Signature: 01/28/2020 3:39:47 PM Version By: Karl Ito Entered By: Zandra Abts on 01/28/2020 15:45:33 -------------------------------------------------------------------------------- Vitals Details Patient Name: Date of Service: [name], [name] 01/28/2020 3:45 PM Medical Record Number: [id number] Patient Account Number: [id number] Date of Birth/Sex: Treating RN: 10-Sep-1985 (34 y.o. [name] Primary Care Eufelia Veno: [name] Other Clinician: Referring Zeniyah Peaster: Treating Magdeline Prange/Extender: Gwyneth Revels in Treatment: 13 Vital  Signs Time Taken: 15:30 Temperature (F): 98.4 Height (in): 77 Pulse (bpm): 103 Weight (lbs): 280 Respiratory Rate (breaths/min): 18 Body Mass Index (BMI): 33.2 Blood Pressure (mmHg): 140/86 Reference Range: 80 - 120 mg / dl Electronic Signature(s) Signed: 01/28/2020 3:39:47 PM By: Karl Ito Entered By: Karl Ito on 01/28/2020 15:30:27

## 2020-02-01 ENCOUNTER — Emergency Department (HOSPITAL_COMMUNITY)
Admission: EM | Admit: 2020-02-01 | Discharge: 2020-02-01 | Disposition: A | Discharge status: Home / Self Care | Payer: BC Managed Care – PPO | Attending: Emergency Medicine | Admitting: Emergency Medicine

## 2020-02-01 ENCOUNTER — Other Ambulatory Visit: Payer: Self-pay

## 2020-02-01 ENCOUNTER — Encounter (HOSPITAL_COMMUNITY): Payer: Self-pay | Admitting: Emergency Medicine

## 2020-02-01 DIAGNOSIS — E10621 Type 1 diabetes mellitus with foot ulcer: Secondary | ICD-10-CM | POA: Diagnosis not present

## 2020-02-01 DIAGNOSIS — Z794 Long term (current) use of insulin: Secondary | ICD-10-CM | POA: Insufficient documentation

## 2020-02-01 DIAGNOSIS — E1065 Type 1 diabetes mellitus with hyperglycemia: Secondary | ICD-10-CM | POA: Diagnosis not present

## 2020-02-01 DIAGNOSIS — Z8616 Personal history of COVID-19: Secondary | ICD-10-CM | POA: Diagnosis not present

## 2020-02-01 DIAGNOSIS — E103413 Type 1 diabetes mellitus with severe nonproliferative diabetic retinopathy with macular edema, bilateral: Secondary | ICD-10-CM | POA: Insufficient documentation

## 2020-02-01 DIAGNOSIS — E1036 Type 1 diabetes mellitus with diabetic cataract: Secondary | ICD-10-CM | POA: Diagnosis not present

## 2020-02-01 DIAGNOSIS — L97519 Non-pressure chronic ulcer of other part of right foot with unspecified severity: Secondary | ICD-10-CM | POA: Diagnosis not present

## 2020-02-01 DIAGNOSIS — L97428 Non-pressure chronic ulcer of left heel and midfoot with other specified severity: Secondary | ICD-10-CM

## 2020-02-01 DIAGNOSIS — R2242 Localized swelling, mass and lump, left lower limb: Secondary | ICD-10-CM | POA: Diagnosis present

## 2020-02-01 LAB — COMPREHENSIVE METABOLIC PANEL
ALT: 21 U/L (ref 0–44)
AST: 18 U/L (ref 15–41)
Albumin: 3.3 g/dL — ABNORMAL LOW (ref 3.5–5.0)
Alkaline Phosphatase: 86 U/L (ref 38–126)
Anion gap: 9 (ref 5–15)
BUN: 16 mg/dL (ref 6–20)
CO2: 25 mmol/L (ref 22–32)
Calcium: 9 mg/dL (ref 8.9–10.3)
Chloride: 102 mmol/L (ref 98–111)
Creatinine, Ser: 0.96 mg/dL (ref 0.61–1.24)
GFR, Estimated: >60 mL/min (ref >60)
Glucose, Bld: 228 mg/dL — ABNORMAL HIGH (ref 70–99)
Potassium: 4.3 mmol/L (ref 3.5–5.1)
Sodium: 136 mmol/L (ref 135–145)
Total Bilirubin: 0.6 mg/dL (ref 0.3–1.2)
Total Protein: 6.9 g/dL (ref 6.5–8.1)

## 2020-02-01 LAB — CBC WITH DIFFERENTIAL/PLATELET
Abs Immature Granulocytes: 0.02 10*3/uL (ref 0.00–0.07)
Basophils Absolute: 0 10*3/uL (ref 0.0–0.1)
Basophils Relative: 1 %
Eosinophils Absolute: 0.3 10*3/uL (ref 0.0–0.5)
Eosinophils Relative: 6 %
HCT: 38 % — ABNORMAL LOW (ref 39.0–52.0)
Hemoglobin: 12.2 g/dL — ABNORMAL LOW (ref 13.0–17.0)
Immature Granulocytes: 0 %
Lymphocytes Relative: 25 %
Lymphs Abs: 1.4 10*3/uL (ref 0.7–4.0)
MCH: 27.4 pg (ref 26.0–34.0)
MCHC: 32.1 g/dL (ref 30.0–36.0)
MCV: 85.4 fL (ref 80.0–100.0)
Monocytes Absolute: 0.5 10*3/uL (ref 0.1–1.0)
Monocytes Relative: 9 %
Neutro Abs: 3.3 10*3/uL (ref 1.7–7.7)
Neutrophils Relative %: 59 %
Platelets: UNDETERMINED 10*3/uL (ref 150–400)
RBC: 4.45 MIL/uL (ref 4.22–5.81)
RDW: 12.6 % (ref 11.5–15.5)
WBC: 5.5 10*3/uL (ref 4.0–10.5)
nRBC: 0 % (ref 0.0–0.2)

## 2020-02-01 LAB — LACTIC ACID, PLASMA: Lactic Acid, Venous: 1.3 mmol/L (ref 0.5–1.9)

## 2020-02-01 MED ORDER — CEPHALEXIN 500 MG PO CAPS: 500.0000 mg | ORAL_CAPSULE | Freq: Four times a day (QID) | ORAL | 0 refills | Status: AC

## 2020-02-01 NOTE — ED Provider Notes (Signed)
[name] EMERGENCY DEPARTMENT Provider Note   CSN: [id number] Arrival date & time: 02/01/20  1810     History Chief Complaint  Patient presents with  . Leg Swelling    [name] is a 34 y.o. male.  The history is provided by the patient.  Illness Location:  Left foot and leg Quality:  Swelling Severity:  Moderate Onset quality:  Gradual Duration:  3 days Timing:  Constant Progression:  Worsening Chronicity:  New Context:  Recently started doxycycline for concern for infected diabetic foot ulcer Relieved by:  Nothing Worsened by:  Nothing Associated symptoms: no abdominal pain, no chest pain, no cough, no ear pain, no fever, no rash, no shortness of breath, no sore throat and no vomiting        Past Medical History:  Diagnosis Date  . Diabetes mellitus   . Osteomyelitis [name]     Patient Active Problem List   Diagnosis Date Noted  . Diabetic foot ulcer associated with type 1 diabetes mellitus (HCC)   . Leg swelling   . Cellulitis of right lower extremity   . Diabetic foot infection (HCC) 09/28/2019  . Normocytic anemia 09/28/2019  . 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 Severe nonproliferative diabetic retinopathy of left eye, with macular edema, associated with type 1 diabetes mellitus (HCC) 07/18/2019  . Diabetic cataract (HCC) 07/18/2019  . Osteomyelitis of ankle or foot, acute, left (HCC) 03/11/2019  . Pneumonia due to severe acute respiratory syndrome coronavirus 2 (SARS-CoV-2) 02/12/2019  . Pneumonia due to COVID-19 virus 02/07/2019  . Acute respiratory failure with hypoxia (HCC) 02/07/2019  . Uncontrolled type 1 diabetes mellitus with hyperglycemia (HCC) 02/07/2019  . COVID-19 virus infection 02/04/2019  . Sepsis (HCC) 02/03/2019  . 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 AKI (acute kidney injury)  (HCC) 02/03/2019  . CAP (community acquired pneumonia) 06/28/2017  . 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 Hyperglycemia without ketosis 01/19/2013    Past Surgical History:  Procedure Laterality Date  . 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 NO PAST SURGERIES    . WOUND DEBRIDEMENT Bilateral 10/02/2019   Procedure: DEBRIDEMENT WOUNDS BOTH LOWER EXTREMITIES WITH BONE BIOPSIES;  Surgeon: Vivi Barrack, DPM;  Location: [facility] OR;  Service: Podiatry;  Laterality: Bilateral;       Family History  Problem Relation Age of Onset  . Hypertension Other   . Diabetes Other   . Healthy Mother   . Colon cancer Neg Hx   . Stomach cancer Neg Hx   . Pancreatic cancer Neg Hx   . Esophageal cancer Neg Hx     Social History   Tobacco Use  . Smoking status: Never Smoker  . Smokeless tobacco: Never Used  Vaping Use  . Vaping Use: Never used  Substance Use Topics  . Alcohol use: No  . Drug use: No    Home Medications Prior to Admission medications   Medication Sig Start Date End Date Taking? Authorizing Provider  cephALEXin (KEFLEX) 500 MG capsule Take 1 capsule (500 mg total) by mouth 4 (four) times daily for 10 days. 02/01/20 02/11/20  Loletha Carrow, MD  Continuous Blood Gluc Sensor (FREESTYLE LIBRE 14 DAY SENSOR) MISC Inject 1 patch into the skin  every 14 (fourteen) days.    [provider]  Continuous Blood Gluc Sensor MISC 1 each by Does not apply route as directed. Use as directed every 14 days. May dispense FreeStyle [employer] or similar. 06/30/17   Darlin Drop, DO  doxycycline (VIBRA-TABS) 100 MG tablet Take 1 tablet (100 mg total) by mouth 2 (two) times daily. 01/27/20   Vivi Barrack, DPM  insulin aspart (NOVOLOG) 100 UNIT/ML injection Inject 0-15 Units into the skin 3 (three) times daily  with meals. Patient taking differently: Inject 7-12 Units into the skin 3 (three) times daily as needed (before meals, if BGL is 250 or greater).  07/01/17   Lonia Blood, MD  Insulin Disposable Pump (OMNIPOD DASH 5 PACK PODS) MISC Inject into the skin. 01/10/20   [provider]  insulin glargine (LANTUS SOLOSTAR) 100 UNIT/ML Solostar Pen Inject 40 Units into the skin daily before breakfast. 08/20/19   Gwyneth Sprout, MD  SANTYL ointment Apply topically daily. 10/02/19   [provider]  silver sulfADIAZINE (SILVADENE) 1 % cream Apply pea-sized amount to wound daily. 10/10/19   Park Liter, DPM    Allergies    Basaglar Stephanie Coup [insulin glargine], Metformin and related, and Trulicity [dulaglutide]  Review of Systems   Review of Systems  Constitutional: Negative for chills and fever.  HENT: Negative for ear pain and sore throat.   Eyes: Negative for pain and visual disturbance.  Respiratory: Negative for cough and shortness of breath.   Cardiovascular: Positive for leg swelling. Negative for chest pain and palpitations.  Gastrointestinal: Negative for abdominal pain and vomiting.  Genitourinary: Negative for dysuria and hematuria.  Musculoskeletal: Negative for arthralgias and back pain.  Skin: Negative for color change and rash.  Neurological: Negative for seizures and syncope.  All other systems reviewed and are negative.   Physical Exam Updated Vital Signs BP (!) 140/91   Pulse 85   Temp 98.4 F (36.9 C) (Oral)   Resp 16   SpO2 100%   Physical Exam Vitals and nursing note reviewed.  Constitutional:      Appearance: He is well-developed. He is obese. He is not ill-appearing, toxic-appearing or diaphoretic.  HENT:     Head: Normocephalic and atraumatic.  Eyes:     Conjunctiva/sclera: Conjunctivae normal.  Cardiovascular:     Rate and Rhythm: Normal rate and regular rhythm.     Heart sounds: No murmur heard.  No gallop.   Pulmonary:     Effort:  Pulmonary effort is normal. No respiratory distress.     Breath sounds: Normal breath sounds.  Abdominal:     Palpations: Abdomen is soft.     Tenderness: There is no abdominal tenderness.  Musculoskeletal:     Cervical back: Neck supple.     Right lower leg: 3+ Pitting Edema present.     Left lower leg: No lacerations or tenderness. 3+ Pitting Edema present.     Comments: Peripheral pitting edema equal in lower legs but more edema in L foot than right foot.   Feet:     Left foot:     Skin integrity: Ulcer (to lateral foot) present. No erythema.     Comments: Pitting edema of left foot, greater than right foot.  No erythema, tenderness to palpation, induration. Skin:    General: Skin is warm and dry.  Neurological:     Mental Status: He is alert and oriented to person, place, and time.     ED  Results / Procedures / Treatments   Labs (all labs ordered are listed, but only abnormal results are displayed) Labs Reviewed  COMPREHENSIVE METABOLIC PANEL - Abnormal; Notable for the following components:      Result Value   Glucose, Bld 228 (*)    Albumin 3.3 (*)    All other components within normal limits  CBC WITH DIFFERENTIAL/PLATELET - Abnormal; Notable for the following components:   Hemoglobin 12.2 (*)    HCT 38.0 (*)    All other components within normal limits  LACTIC ACID, PLASMA    EKG None  Radiology No results found.  Procedures Procedures (including critical care time)  Medications Ordered in ED Medications - No data to display  ED Course  I have reviewed the triage vital signs and the nursing notes.  Pertinent labs & imaging results that were available during my care of the patient were reviewed by me and considered in my medical decision making (see chart for details).    MDM Rules/Calculators/A&P                          The patient is a 34yo male, PMH DM2, chronic diabetic ulcer who presents to the ED for worsening swelling in L leg and foot.  On  my initial evaluation, the patient is  hemodynamically stable, afebrile, nontoxic-appearing. Physical exam remarkable for BLE edema in lower legs that appears equal with the exception that the L foot appears to have more edema.  Differentials considered include diabetic ulcer, cellulitis, abscess, necrotizing fasciitis, osteomyelitis. I am most concerned for his diabetic ulcer being infected because on record review, patient had wound culture done a few days ago, which is positive for Streptococcus agalactiae. Labs obtained prior to my evaluation remarkable for no leuokcytosis and hyperglycemia. With otherwise reassuring exam (no erythema, significant purulence, no fevers, no induration or fluctuance), low suspicion for worsening or ascending infection. However, discussed with pharmacy. 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 Pharmacy agrees, reported a beta lactam such as keflex would have better coverage for his wound. Do not think patient requires further labs or ED imaging at this time.   Advised patient of concern for ongoing diabetic ulcer. Advised treatment of symptoms with switching antibiotic to keflex, which was prescribed and recommended elevation of his legs.  Recommended follow-up with PCP in the next couple days. Strict return precautions provided. Patient discharged in stable condition.   The care of this patient was overseen by Dr. Madilyn Hook, who agreed with evaluation and plan of care.   Final Clinical Impression(s) / ED Diagnoses Final diagnoses:  Diabetic ulcer of left midfoot associated with type 1 diabetes mellitus, with other ulcer severity (HCC)    Rx / DC Orders ED Discharge Orders         Ordered    VAS [name] LOWER EXTREMITY VENOUS (DVT)        02/01/20 2235    cephALEXin (KEFLEX) 500 MG capsule  4 times daily        02/01/20 2302           Loletha Carrow, MD 02/02/20 6433     Tilden Fossa, MD 02/02/20 1743

## 2020-02-01 NOTE — ED Notes (Signed)
Pt left foot dressed with Telfa pad and Kerlix.

## 2020-02-01 NOTE — ED Triage Notes (Signed)
C/o L leg swelling x 2-3 days.  Pt seen in ED on 10/23 after falling while dancing and having L knee pain.  States he saw podiatrist on Monday for wound on L foot and the results in MyChart showed an infection.

## 2020-02-02 ENCOUNTER — Ambulatory Visit (HOSPITAL_COMMUNITY): Admission: RE | Admit: 2020-02-02 | Payer: BC Managed Care – PPO | Source: Ambulatory Visit

## 2020-02-02 NOTE — Progress Notes (Signed)
Subjective: 34 year old male presents the office today for evaluation of wounds to both of his feet.  Since I last saw him he still being treated by wound care center.  The wounds on the right side healed on the left side the area healed but apparently had opened back up because of infection.  He presents today for evaluation.  No other concerns today. Denies any systemic complaints such as fevers, chills, nausea, vomiting. No acute changes since last appointment, and no other complaints at this time.   Objective: AAO x3, NAD DP/PT pulses palpable bilaterally, CRT less than 3 seconds Ulceration of the right foot appear to be healed.  On the left foot along the fifth metatarsal head is a hyperkeratotic lesion with granular wound base.  Some blister formation is present on the periphery of the wound and upon debridement serosanguineous drainage expressed which I cultured.  There is no fluctuation or crepitation.  No malodor.  There is no ascending cellulitis.  No pain with calf compression, swelling, warmth, erythema  Assessment: 34 year old male left foot ulceration, superficial abscess  Plan: -All treatment options discussed with the patient including all alternatives, risks, complications.  -Debrided the wound on the left without complications utilizing the 312 with scalpel to debride nonviable tissue not to debride the blister.  The fluid was cultured today.  1.2 defer treatment currently to the wound care center since he is actively being treated. -Patient encouraged to call the office with any questions, concerns, change in symptoms.   Vivi Barrack DPM

## 2020-02-08 ENCOUNTER — Encounter: Payer: Self-pay | Admitting: Podiatry

## 2020-02-13 ENCOUNTER — Other Ambulatory Visit (HOSPITAL_COMMUNITY)
Admission: RE | Admit: 2020-02-13 | Discharge: 2020-02-13 | Disposition: A | Discharge status: Home / Self Care | Payer: BC Managed Care – PPO | Source: Other Acute Inpatient Hospital | Attending: Internal Medicine | Admitting: Internal Medicine

## 2020-02-13 ENCOUNTER — Other Ambulatory Visit (HOSPITAL_COMMUNITY): Payer: Self-pay | Admitting: Internal Medicine

## 2020-02-13 ENCOUNTER — Encounter (HOSPITAL_BASED_OUTPATIENT_CLINIC_OR_DEPARTMENT_OTHER): Payer: BC Managed Care – PPO | Attending: Internal Medicine | Admitting: Internal Medicine

## 2020-02-13 ENCOUNTER — Other Ambulatory Visit: Payer: Self-pay

## 2020-02-13 DIAGNOSIS — E11621 Type 2 diabetes mellitus with foot ulcer: Secondary | ICD-10-CM | POA: Diagnosis present

## 2020-02-13 DIAGNOSIS — L97528 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ity: Secondary | ICD-10-CM | POA: Insufficient documentation

## 2020-02-13 DIAGNOSIS — L97518 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ity: Secondary | ICD-10-CM | POA: Insufficient documentation

## 2020-02-13 DIAGNOSIS — L97529 Non-pressure chronic ulcer of other part of left foot with unspecified severity: Secondary | ICD-10-CM

## 2020-02-13 NOTE — Progress Notes (Signed)
[place], [name] ([id number]) Visit Report for 02/13/2020 Debridement Details Patient Name: Date of Service: [name] D 02/13/2020 1:30 PM Medical Record Number: [id number] Patient Account Number: [id number] Date of Birth/Sex: Treating RN: Apr 08, 1985 (34 y.o. [name] Primary Care Provider: [name] Other Clinician: Referring Provider: Treating Provider/Extender: [name] in Treatment: 16 Debridement Performed for Assessment: Wound #4 Right,Lateral Foot Performed By: Clinician [name], RN Debridement Type: Chemical/Enzymatic/Mechanical Agent Used: gauze and wound cleanser Severity of Tissue Pre Debridement: Limited to breakdown of skin Level of Consciousness (Pre-procedure): Awake and Alert Pre-procedure Verification/Time Out Yes - 14:15 Taken: Start Time: 14:16 Bleeding: None Response to Treatment: Procedure was tolerated well Level of Consciousness (Post- Awake and Alert procedure): Post Debridement Measurements of Total Wound Length: (cm) 0.9 Width: (cm) 1.3 Depth: (cm) 0.1 Volume: (cm) 0.092 Character of Wound/Ulcer Post Debridement: Stable Severity of Tissue Post Debridement: Limited to breakdown of skin Post Procedure Diagnosis Same as Pre-procedure Electronic Signature(s) Signed: 02/13/2020 5:00:15 PM By: [name] MD Signed: 02/13/2020 5:09:44 PM By: Deon Pilling Entered By: Deon Pilling on 02/13/2020 14:49:52 -------------------------------------------------------------------------------- HPI Details Patient Name: Date of Service: [name], [name] 02/13/2020 1:30 PM Medical Record Number: [id number] Patient Account Number: [id number] Date of Birth/Sex: Treating RN: [birth year] (34 y.o. 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 He comes in for review of a wound over the plantar fifth metatarsal head extending into the lateral part of the foot. He was followed for this previously by his podiatrist Dr. Cornelius Moras. As the patient tells his story he went to see podiatry first for a swelling he developed on the lateral part of his fifth metatarsal head in May. He states this was "open" by podiatry and the area closed. He was followed up in June and it was again opened callus removed and it closed promptly. There were plans being made for surgery on the fifth metatarsal head in June however his blood sugar was apparently too high for anesthesia. Apparently the area was debrided and opened again in June and it is never closed since. Looking over the records from podiatry I am really not able to follow this. It was clear when he was first seen it was before 5/14 at that point he already had a wound. By 5/17 the ulcer was resolved. I do not see anything about a procedure. On 5/28 noted to have pre-ulcerative moderate keratosis. X-ray noted 1/5 contracted toe and tailor's bunion and metatarsal deformity. On a visit date on 09/28/2018 the dorsal part of the left foot it healed and resolved. There was concern about swelling in his lower extremity he was sent to the ER.. As far as I can tell he was seen in the ER on 7/12 with an ulcer on his left foot. A DVT rule out of the left leg was negative. I do not think I have complete records from podiatry but I am not able to verify the procedures this patient states he had. He states after the last procedure the wound has never closed although I am not able to follow this in the records I have from podiatry. He has not had a recent x-ray The patient has been using Neosporin on the wound. He is wearing a Darco shoe.  He is still very active up on his foot working and exercising. Past  medical history; type 2 diabetes ketosis-prone, leg swelling with a negative DVT study in July. Non-smoker ABI in our clinic was 0.85 on the left 10/16; substantial wound on the plantar left fifth met head extending laterally almost to the dorsal fifth MTP. We have been using silver alginate we gave him a Darco forefoot off loader. An x-ray did not show evidence of osteomyelitis did note soft tissue emphysema which I think was due to gas tracking through an open wound. There is no doubt in my mind he requires an MRI 10/23; MRI not booked until 3 November at the earliest this is largely due to his glucose sensor in the right arm. We have been using silver alginate. There has been an improvement 10/29; I am still not exactly sure when his MRI is booked for. He says it is the third but it is the 10th in epic. This definitely needs to be done. He is running a low-grade fever today but no other symptoms. No real improvement in the 1 02/26/2019 patient presents today for a follow-up visit here in our clinic he is last been seen in the clinic on October 29. Subsequently we were working on getting MRI to evaluate and see what exactly was going on and where we would need to go from the standpoint of whether or not he had osteomyelitis and again what treatments were going be required. Subsequently the patient ended up being admitted to the hospital on 02/07/2019 and was discharged on 02/14/2019. This is a somewhat interesting admission with a discharge diagnosis of pneumonia due to COVID-19 although he was positive for COVID-19 when tested at the urgent care but negative x2 when he was actually in the hospital. With that being said he did have acute respiratory failure with hypoxia and it was noted he also have a left foot ulceration with osteomyelitis. With that being said he did require oxygen for his pneumonia and I level 4 L. He was placed on antivirals and steroids for the COVID-19. He was also transferred  to the [facility] at one point. Nonetheless he did subsequently discharged home and since being home has done much better in that regard. The CT angiogram did not show any pulmonary embolism. With regard to the osteomyelitis the patient was placed on vancomycin and Zosyn while in the hospital but has been changed to Augmentin at discharge. It was also recommended that he follow- up with wound care and podiatry. Podiatry however wanted him to see [name] according to the patient prior to them doing anything further. His hemoglobin A1c was 9.9 as noted in the hospital. Have an MRI of the left foot performed while in the hospital on 02/04/2019. 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 There is an overlying plantar open wound noted an abscess tracking back along the lateral aspect of the fifth metatarsal shaft. There is otherwise diffuse cellulitis and mild fasciitis without findings of polymyositis. The patient did have recently pneumonia secondary to COVID-19 I looked in the chart through epic and it does appear that the patient may need to have an additional x-ray just to ensure everything is cleared and that he has no airspace disease prior to putting him into the [name]. 03/05/2019; patient was readmitted to the clinic last week. He was hospitalized twice for a viral upper respiratory tract infection  from 11/1 through 11/4 and then 11/5 through 11/12 ultimately this turned out to be Covid pneumonitis. Although he was discharged on oxygen he is not using it. He says he feels fine. He has no exercise limitation no cough no sputum. His O2 sat in our clinic today was 100% on room air. 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 He received Vanco and Zosyn in the hospital and then was  discharged on 2 weeks of Augmentin. I do not see any relevant cultures. He was supposed to follow-up with infectious disease but I do not see that he has an appointment. 12/8; patient saw Dr. Novella Olive of infectious disease last week. He felt that he had had adequate antibiotic therapy. He did not go to follow-up with Dr. Amalia Hailey of podiatry and I have again talked to him about the pros and cons of this. He does not want to consider a ray amputation of this time. He is aware of the risks of recurrence, migration etc. He started HBO today and tolerated this well. He can complete the Augmentin that I gave him last week. I have looked over the lab work that Dr. Chana Bode ordered his C-reactive protein was 3.3 and his sedimentation rate was 17. The C-reactive protein is never really been measurably that high in this patient 12/15; not much change in the wound today however he has undermining along the lateral part of the foot again more extensively than last week. He has some rims of epithelialization. We have been using silver alginate. He is undergoing hyperbarics but did not dive today 12/18; in for his obligatory first total contact cast change. Unfortunately there was pus coming from the undermining area around his fifth metatarsal head. This was cultured but will preclude reapplication of a cast. He is seen in conjunction with HBO 12/24; patient had staph lugdunensis in the wound in the undermining area laterally last time. We put him on doxycycline which should have covered this. The wound looks better today. I am going to give him another week of doxycycline before reattempting the total contact cast 12/31; the patient is completing antibiotics. Hemorrhagic debris in the distal part of the wound with some undermining distally. He also had hyper granulation. Extensive debridement with a #5 curette. The infected area that was on the lateral part of the fifth met head is closed over. I do not think he needs  any more antibiotics. Patient was seen prior to HBO. Preparations for a total contact cast were made in the cast will be placed post hyperbarics 04/11/19; once again the patient arrives today without complaint. He had been in a cast all week noted that he had heavy drainage this week. This resulted in large raised areas of macerated tissue around the wound 1/14; wound bed looks better slightly smaller. Hydrofera Blue has been changing himself. He had a heavy drainage last week which caused a lot of maceration around the wound so I took him out of a total contact cast he says the drainage is actually better this week He is seen today in conjunction with HBO 1/21; returns to clinic. He was up in [place] for a day or 2 attending a funeral. He comes back in with the wound larger and with a large area of exposed bone. He had osteomyelitis and septic arthritis of the fifth left metatarsal head while he was in hospital. He received  IV antibiotics in the hospital for a prolonged period of time then 3 weeks of Augmentin. Subsequently I gave him 2 weeks of doxycycline for more superficial wound infection. When I saw this last week the wound was smaller the surface of the wound looks satisfactory. 1/28; patient missed hyperbarics today. Bone biopsy I did last time showed Enterococcus faecalis and Staphylococcus lugdunensis . He has a wide area of exposed bone. We are going to use silver alginate as of today. I had another ethical discussion with the patient. 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 In this situation I think the likelihood of healing this is low. Therefore I have recommended a ray amputation and with the patient's agreement I have referred him to Dr. Doran Durand. The other issue is that his compliance with hyperbarics has been minimal because of his work schedule and given his underlying decision I am going to stop this today READMISSION 10/24/2019 MRI 09/29/2019  left foot IMPRESSION: 1. Apparent skin ulceration inferior and lateral to the 5th metatarsal base with underlying heterogeneous T2 signal and enhancement in the subcutaneous fat. Small peripherally enhancing fluid collections along the plantar and lateral aspects of the 5th metatarsal base suspicious for abscesses. 2. Interval amputation through the mid 5th metatarsal with nonspecific low-level marrow edema and enhancement. Given the proximity to the adjacent soft tissue inflammatory changes, osteomyelitis cannot be excluded. 3. The additional bones appear unremarkable. MRI 09/29/2019 right foot IMPRESSION: 1. Soft tissue ulceration lateral to the 5th MTP joint. There is low-level T2 hyperintensity within the 4th and 5th metatarsal heads and adjacent proximal phalanges without abnormal T1 signal or cortical destruction. These findings are nonspecific and could be seen with early marrow edema, hyperemia or early osteomyelitis. No evidence of septic joint. 2. Mild tenosynovitis and synovial enhancement associated with the extensor digitorum tendons at the level of the midfoot. 3. Diffuse low-level muscular T2 hyperintensity and enhancement, most consistent with diabetic myopathy. LEFT FOOT BONE Methicillin resistant staphylococcus aureus Staphylococcus lugdunensis MIC MIC CIPROFLOXACIN >=8 RESISTANT Resistant <=0.5 SENSI... Sensitive CLINDAMYCIN <=0.25 SENS... Sensitive >=8 RESISTANT Resistant ERYTHROMYCIN >=8 RESISTANT Resistant >=8 RESISTANT Resistant GENTAMICIN <=0.5 SENSI... Sensitive <=0.5 SENSI... Sensitive Inducible Clindamycin NEGATIVE Sensitive NEGATIVE Sensitive OXACILLIN >=4 RESISTANT Resistant 2 SENSITIVE Sensitive RIFAMPIN <=0.5 SENSI... Sensitive <=0.5 SENSI... Sensitive TETRACYCLINE <=1 SENSITIVE Sensitive <=1 SENSITIVE Sensitive TRIMETH/SULFA <=10 SENSIT Sensitive <=10 SENSIT Sensitive ... Marland Kitchen.. VANCOMYCIN 1 SENSITIVE Sensitive <=0.5 SENSI... Sensitive Right foot  bone . Component 3 wk ago Specimen Description BONE Special Requests RIGHT 4 METATARSAL SAMPLE B Gram Stain NO WBC SEEN NO ORGANISMS SEEN Culture RARE METHICILLIN RESISTANT STAPHYLOCOCCUS AUREUS NO ANAEROBES ISOLATED Performed at [facility], [place] [address], [place], [place] [address] Report Status 10/08/2019 FINAL Organism ID, Bacteria METHICILLIN RESISTANT STAPHYLOCOCCUS AUREUS Resulting Agency [facility] Susceptibility Methicillin resistant staphylococcus aureus MIC CIPROFLOXACIN >=8 RESISTANT Resistant CLINDAMYCIN <=0.25 SENS... Sensitive ERYTHROMYCIN >=8 RESISTANT Resistant GENTAMICIN <=0.5 SENSI... Sensitive Inducible Clindamycin NEGATIVE Sensitive OXACILLIN >=4 RESISTANT Resistant RIFAMPIN <=0.5 SENSI... Sensitive TETRACYCLINE <=1 SENSITIVE Sensitive TRIMETH/SULFA <=10 SENSIT Sensitive ... VANCOMYCIN 1 SENSITIVE Sensitive This is a patient we had in clinic earlier this year with a wound over his left fifth metatarsal head. 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 Doran Durand on 05/16/2019. He seems to have developed problems with wounds on his bilateral feet in June 2021 although he may have had problems earlier than this. He was in an urgent care with a right foot ulcer on 09/26/2019 and given a course of doxycycline. This was apparently after having trouble getting into see orthopedics. He was seen by podiatry on 09/28/2019 noted to have bilateral lower extremity ulcers including the left lateral fifth metatarsal base and the right subfifth met head. It was noted that had purulent drainage at that time. He required hospitalization from 6/20 through 7/2. This was because of worsening right foot  wounds. He underwent bilateral operative incision and drainage and bone biopsies bilaterally. Culture results are listed above. He has been referred back to clinic by Dr. Jacqualyn Posey of podiatry. He is also followed by Dr. Megan Salon who saw him yesterday. He was discharged from hospital on Zyvox Flagyl and Levaquin and yesterday changed to doxycycline Flagyl and Levaquin. His inflammatory markers on 6/26 showed a sedimentation rate of 129 and a C-reactive protein of 5. This is improved to 14 and 1.3 respectively. This would indicate improvement. ABIs in our clinic today were 1.23 on the right and 1.20 on the left 11/01/2019 on evaluation today patient appears to be doing fairly well in regard to the wounds on his feet at this point. Fortunately there is no signs of active infection at this time. No fevers, chills, nausea, vomiting, or diarrhea. He currently is seeing infectious disease and still under their care at this point. Subsequently he also has both wounds which she has not been using collagen on as he did not receive that in his packaging he did not call us and let us know that. Apparently that just was missed on the order. Nonetheless we will get that straightened out today. 8/9-Patient returns for bilateral foot wounds, using Prisma with hydrogel moistened dressings, and the wounds appear stable. Patient using surgical shoes, avoiding much pressure or weightbearing as much as possible 8/16; patient has bilateral foot wounds. 1 on the right lateral foot proximally the other is on the left mid lateral foot. Both required debridement of callus and thick skin around the wounds. We have been using silver collagen 8/27; patient has bilateral lateral foot wounds. The area on the left substantially surrounded by callus and dry skin. This was removed from the wound edge. The underlying wound is small. The area on the right measured somewhat smaller today. We've been using silver collagen the patient was  on antibiotics for underlying osteomyelitis in the left foot. Unfortunately I did not update his antibiotics during today's visit. 9/10 I reviewed Dr. Hale Bogus last notes he felt he had completed antibiotics his inflammatory markers were reasonably well controlled. He has a small wound on the lateral left foot and a tiny area on the right which is just above closed. He is using Hydrofera Blue with border foam he has bilateral surgical shoes 9/24; 2 week f/u. doing well. right foot is closed. left foot still undermined. 10/14; right foot remains closed at the fifth met head. The area over the base of the left fifth metatarsal has a small open area but considerable undermining towards the plantar foot. Thick callus skin around this suggests an adequate pressure relief. We have talked about this. He says he is going to go back into his cam boot. I suggested a total contact cast he did not seem enamored with this suggestion 10/26; left foot base of the fifth  metatarsal. Same condition as last time. He has skin over the area with an open wound however the skin is not adherent. He went to see Dr. Earleen Newport who did an x-ray and culture of his foot I have not reviewed the x-ray but the patient was not told anything. He is on doxycycline 11/11; since the patient was last here he was in the emergency room on 10/30 he was concerned about swelling in the left foot. They did not do any cultures or x-rays. They changed his antibiotics to cephalexin. Previous culture showed group B strep. The cephalexin is appropriate as doxycycline has less than predictable coverage. Arrives in clinic today with swelling over this area under the wound. He also has a new wound on the right fifth metatarsal head Electronic Signature(s) Signed: 02/13/2020 5:00:15 PM By: [name] MD Entered By: [name] on 02/13/2020 14:45:31 -------------------------------------------------------------------------------- Physical Exam  Details Patient Name: Date of Service: [name], [name] 02/13/2020 1:30 PM Medical Record Number: [id number] Patient Account Number: [id number] Date of Birth/Sex: Treating RN: 1986-01-22 (34 y.o. [name] Primary Care Provider: [name] Other Clinician: Referring Provider: Treating Provider/Extender: [name] in Treatment: 16 Constitutional Sitting or standing Blood Pressure is within target range for patient.. Pulse regular and within target range for patient.Marland Kitchen Respirations regular, non-labored and within target range.. Temperature is normal and within the target range for the patient.Marland Kitchen Appears in no distress. Cardiovascular Pedal pulses palpable and strong bilaterally.. Notes Wound exam Left lateral fifth metatarsal at the base. This entire area is swollen. Almost ballotable. I used a #15 scalpel and opened this I got a sanguinous drainage which I have cultured. He has a new wound that has been there for about 10 days on the right lateral fifth metatarsal head this is superficial and does not look ominous. He was not wearing anything in this area in terms of offloading Electronic Signature(s) Signed: 02/13/2020 5:00:15 PM By: [name] MD Entered By: [name] on 02/13/2020 14:48:50 -------------------------------------------------------------------------------- Physician Orders Details Patient Name: Date of Service: [name], [name] 02/13/2020 1:30 PM Medical Record Number: [id number] Patient Account Number: [id number] Date of Birth/Sex: Treating RN: 10-28-1985 (34 y.o. [name] Primary Care Provider: [name] Other Clinician: Referring Provider: Treating Provider/Extender: [name] in Treatment: 15 Verbal / Phone Orders: No Diagnosis Coding ICD-10 Coding Code Description E11.621 Type 2 diabetes mellitus with foot ulcer L97.518 Non-pressure chronic ulcer of other part of right  foot with other specified severity L97.528 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ity 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, right ankle and foot B95.62 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 classified elsewhere Follow-up Appointments Return Appointment in 1 week. Dressing Change Frequency Change Dressing every other day. Skin Barriers/Peri-Wound Care Moisturizing lotion - both legs and feet Wound Cleansing May shower and wash wound with soap and water. - on days that dressing is changed Primary Wound Dressing Wound #3 Left,Lateral Foot Calcium Alginate with Silver Wound #4 Right,Lateral Foot Calcium Alginate with Silver Secondary Dressing Wound #3 Left,Lateral Foot Foam - foam donut. Kerlix/Rolled Gauze Dry Gauze Wound #4 Right,Lateral Foot Foam - foam donut. Kerlix/Rolled Gauze Dry Gauze Off-Loading Open toe surgical shoe to: - left foot Radiology MRI, left foot with and without contrast - MRI of left lateral foot with and without contrast related to nonhealing diabetic ulcer looking for infection/osteomyelitis. CPT Code - (ICD10 E11.621 - Type  2 diabetes mellitus with foot ulcer) Patient Medications llergies: metformin A Notifications Medication Indication Start End wound infection 02/13/2020 cephalexin DOSE oral 500 mg capsule - 1 capsule oral qid for 7 days (continuing rx) Electronic Signature(s) Signed: 02/13/2020 2:51:29 PM By: [name] MD Entered By: [name] on 02/13/2020 14:51:28 Prescription 02/13/2020 -------------------------------------------------------------------------------- [place], [name] Marshell Rieger MD Patient Name: Provider: 1985-09-23 [id number] Date of Birth: NPI#: Dillard [place] Sex: DEA #: [phone] [id number] Phone #: License #: [place] Patient Address: [address] [place],  [place] [address] [place], [place] [address] [phone] Allergies metformin Provider's Orders MRI, left foot with and without contrast - ICD10: E11.621 - MRI of left lateral foot with and without contrast related to nonhealing diabetic ulcer looking for infection/osteomyelitis. CPT Code Hand Signature: Date(s): Electronic Signature(s) Signed: 02/13/2020 5:00:15 PM By: [name] MD Entered By: [name] on 02/13/2020 14:51:29 -------------------------------------------------------------------------------- Problem List Details Patient Name: Date of Service: [name], [name] 02/13/2020 1:30 PM Medical Record Number: [id number] Patient Account Number: [id number] Date of Birth/Sex: Treating RN: September 21, 1985 (34 y.o. 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, left ankle and foot 10/24/2019 10/24/2019 B95.62 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 10/24/2019 10/24/2019 classified elsewhere 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, right ankle and foot 10/24/2019 10/24/2019 Resolved Problems Electronic Signature(s) Signed: 02/13/2020 5:00:15 PM By: [name] MD Entered By: [name] on 02/13/2020 14:40:15 -------------------------------------------------------------------------------- Progress Note Details Patient Name:  Date of Service: [name], [name] 02/13/2020 1:30 PM Medical Record Number: [id number] Patient Account Number: [id number] Date of Birth/Sex: Treating RN: June 01, 1985 (34 y.o. 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 He comes in for review of a wound over the plantar fifth metatarsal head extending into the lateral part of the foot. He was followed for this previously by his podiatrist Dr. Cornelius Moras. As the patient tells his story he went to see podiatry first for a swelling he developed on the lateral part of his fifth metatarsal head in May. He states this was "open" by podiatry and the area closed. He was followed up in June and it was again opened callus removed and it closed promptly. There were plans being made for surgery on the fifth metatarsal head in June however his blood sugar was apparently too high for anesthesia. Apparently the area was debrided and opened again in June and it is never closed since. Looking over the records from podiatry I am really not able to follow this. It was clear when he was first seen it was before 5/14 at that point he already had a wound. By 5/17 the ulcer was resolved. I do not see anything about a procedure. On 5/28 noted to have pre-ulcerative moderate keratosis.  X-ray noted 1/5 contracted toe and tailor's bunion and metatarsal deformity. On a visit date on 09/28/2018 the dorsal part of the left foot it healed and resolved. There was concern about swelling in his lower extremity he was sent to the ER.. As far as I can tell he was seen in the ER on 7/12 with an ulcer on his left foot. A DVT rule out of the left leg was negative. I do not think I have complete records from podiatry  but I am not able to verify the procedures this patient states he had. He states after the last procedure the wound has never closed although I am not able to follow this in the records I have from podiatry. He has not had a recent x-ray The patient has been using Neosporin on the wound. He is wearing a Darco shoe. He is still very active up on his foot working and exercising. Past medical history; type 2 diabetes ketosis-prone, leg swelling with a negative DVT study in July. Non-smoker ABI in our clinic was 0.85 on the left 10/16; substantial wound on the plantar left fifth met head extending laterally almost to the dorsal fifth MTP. We have been using silver alginate we gave him a Darco forefoot off loader. An x-ray did not show evidence of osteomyelitis did note soft tissue emphysema which I think was due to gas tracking through an open wound. There is no doubt in my mind he requires an MRI 10/23; MRI not booked until 3 November at the earliest this is largely due to his glucose sensor in the right arm. We have been using silver alginate. There has been an improvement 10/29; I am still not exactly sure when his MRI is booked for. He says it is the third but it is the 10th in epic. This definitely needs to be done. He is running a low-grade fever today but no other symptoms. No real improvement in the 1 02/26/2019 patient presents today for a follow-up visit here in our clinic he is last been seen in the clinic on October 29. Subsequently we were working on getting MRI to evaluate and see what exactly was going on and where we would need to go from the standpoint of whether or not he had osteomyelitis and again what treatments were going be required. Subsequently the patient ended up being admitted to the hospital on 02/07/2019 and was discharged on 02/14/2019. This is a somewhat interesting admission with a discharge diagnosis of pneumonia due to COVID-19 although he was positive for COVID-19  when tested at the urgent care but negative x2 when he was actually in the hospital. With that being said he did have acute respiratory failure with hypoxia and it was noted he also have a left foot ulceration with osteomyelitis. With that being said he did require oxygen for his pneumonia and I level 4 L. He was placed on antivirals and steroids for the COVID-19. He was also transferred to the [facility] at one point. Nonetheless he did subsequently discharged home and since being home has done much better in that regard. The CT angiogram did not show any pulmonary embolism. With regard to the osteomyelitis the patient was placed on vancomycin and Zosyn while in the hospital but has been changed to Augmentin at discharge. It was also recommended that he follow- up with wound care and podiatry. Podiatry however wanted him to see [name] according to the patient prior to them doing anything further. His hemoglobin  A1c was 9.9 as noted in the hospital. Have an MRI of the left foot performed while in the hospital on 02/04/2019. 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 There is an overlying plantar open wound noted an abscess tracking back along the lateral aspect of the fifth metatarsal shaft. There is otherwise diffuse cellulitis and mild fasciitis without findings of polymyositis. The patient did have recently pneumonia secondary to COVID-19 I looked in the chart through epic and it does appear that the patient may need to have an additional x-ray just to ensure everything is cleared and that he has no airspace disease prior to putting him into the [name]. 03/05/2019; patient was readmitted to the clinic last week. He was hospitalized twice for a viral upper respiratory tract infection from 11/1 through 11/4 and then 11/5 through 11/12 ultimately this turned out to be Covid pneumonitis. Although he was  discharged on oxygen he is not using it. He says he feels fine. He has no exercise limitation no cough no sputum. His O2 sat in our clinic today was 100% on room air. 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 He received Vanco and Zosyn in the hospital and then was discharged on 2 weeks of Augmentin. I do not see any relevant cultures. He was supposed to follow-up with infectious disease but I do not see that he has an appointment. 12/8; patient saw Dr. Novella Olive of infectious disease last week. He felt that he had had adequate antibiotic therapy. He did not go to follow-up with Dr. Amalia Hailey of podiatry and I have again talked to him about the pros and cons of this. He does not want to consider a ray amputation of this time. He is aware of the risks of recurrence, migration etc. He started HBO today and tolerated this well. He can complete the Augmentin that I gave him last week. I have looked over the lab work that Dr. Chana Bode ordered his C-reactive protein was 3.3 and his sedimentation rate was 17. The C-reactive protein is never really been measurably that high in this patient 12/15; not much change in the wound today however he has undermining along the lateral part of the foot again more extensively than last week. He has some rims of epithelialization. We have been using silver alginate. He is undergoing hyperbarics but did not dive today 12/18; in for his obligatory first total contact cast change. Unfortunately there was pus coming from the undermining area around his fifth metatarsal head. This was cultured but will preclude reapplication of a cast. He is seen in conjunction with HBO 12/24; patient had staph lugdunensis in the wound in the undermining area laterally last time. We put him on doxycycline which should have covered this. The wound looks better today. I am going to give  him another week of doxycycline before reattempting the total contact cast 12/31; the patient is completing antibiotics. Hemorrhagic debris in the distal part of the wound with some undermining distally. He also had hyper granulation. Extensive debridement with a #5 curette. The infected area that was on the lateral part of the fifth met head is closed over. I do not think he needs any more antibiotics. Patient was seen prior to HBO. Preparations for a total contact cast were made in the cast will be placed post  hyperbarics 04/11/19; once again the patient arrives today without complaint. He had been in a cast all week noted that he had heavy drainage this week. This resulted in large raised areas of macerated tissue around the wound 1/14; wound bed looks better slightly smaller. Hydrofera Blue has been changing himself. He had a heavy drainage last week which caused a lot of maceration around the wound so I took him out of a total contact cast he says the drainage is actually better this week He is seen today in conjunction with HBO 1/21; returns to clinic. He was up in [place] for a day or 2 attending a funeral. He comes back in with the wound larger and with a large area of exposed bone. He had osteomyelitis and septic arthritis of the fifth left metatarsal head while he was in hospital. He received IV antibiotics in the hospital for a prolonged period of time then 3 weeks of Augmentin. Subsequently I gave him 2 weeks of doxycycline for more superficial wound infection. When I saw this last week the wound was smaller the surface of the wound looks satisfactory. 1/28; patient missed hyperbarics today. Bone biopsy I did last time showed Enterococcus faecalis and Staphylococcus lugdunensis . He has a wide area of exposed bone. We are going to use silver alginate as of today. I had another ethical discussion with the patient. 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  In this situation I think the likelihood of healing this is low. Therefore I have recommended a ray amputation and with the patient's agreement I have referred him to Dr. Doran Durand. The other issue is that his compliance with hyperbarics has been minimal because of his work schedule and given his underlying decision I am going to stop this today READMISSION 10/24/2019 MRI 09/29/2019 left foot IMPRESSION: 1. Apparent skin ulceration inferior and lateral to the 5th metatarsal base with underlying heterogeneous T2 signal and enhancement in the subcutaneous fat. Small peripherally enhancing fluid collections along the plantar and lateral aspects of the 5th metatarsal base suspicious for abscesses. 2. Interval amputation through the mid 5th metatarsal with nonspecific low-level marrow edema and enhancement. Given the proximity to the adjacent soft tissue inflammatory changes, osteomyelitis cannot be excluded. 3. The additional bones appear unremarkable. MRI 09/29/2019 right foot IMPRESSION: 1. Soft tissue ulceration lateral to the 5th MTP joint. There is low-level T2 hyperintensity within the 4th and 5th metatarsal heads and adjacent proximal phalanges without abnormal T1 signal or cortical destruction. These findings are nonspecific and could be seen with early marrow edema, hyperemia or early osteomyelitis. No evidence of septic joint. 2. Mild tenosynovitis and synovial enhancement associated with the extensor digitorum tendons at the level of the midfoot. 3. Diffuse low-level muscular T2 hyperintensity and enhancement, most consistent with diabetic myopathy. LEFT FOOT BONE Methicillin resistant staphylococcus aureus Staphylococcus lugdunensis MIC MIC CIPROFLOXACIN >=8 RESISTANT Resistant <=0.5 SENSI... Sensitive CLINDAMYCIN <=0.25 SENS... Sensitive >=8 RESISTANT Resistant ERYTHROMYCIN >=8 RESISTANT Resistant >=8 RESISTANT Resistant GENTAMICIN <=0.5 SENSI... Sensitive <=0.5 SENSI...  Sensitive Inducible Clindamycin NEGATIVE Sensitive NEGATIVE Sensitive OXACILLIN >=4 RESISTANT Resistant 2 SENSITIVE Sensitive RIFAMPIN <=0.5 SENSI... Sensitive <=0.5 SENSI... Sensitive TETRACYCLINE <=1 SENSITIVE Sensitive <=1 SENSITIVE Sensitive TRIMETH/SULFA <=10 SENSIT Sensitive <=10 SENSIT Sensitive ... Marland Kitchen.. VANCOMYCIN 1 SENSITIVE Sensitive <=0.5 SENSI... Sensitive Right foot bone . Component 3 wk ago Specimen Description BONE Special Requests RIGHT 4 METATARSAL SAMPLE B Gram Stain NO WBC SEEN NO ORGANISMS SEEN Culture RARE METHICILLIN RESISTANT STAPHYLOCOCCUS AUREUS NO ANAEROBES ISOLATED  Performed at [facility], [place] [address] [place], [place] [address] Report Status 10/08/2019 FINAL Organism ID, Bacteria METHICILLIN RESISTANT STAPHYLOCOCCUS AUREUS Resulting Agency [facility] Susceptibility Methicillin resistant staphylococcus aureus MIC CIPROFLOXACIN >=8 RESISTANT Resistant CLINDAMYCIN <=0.25 SENS... Sensitive ERYTHROMYCIN >=8 RESISTANT Resistant GENTAMICIN <=0.5 SENSI... Sensitive Inducible Clindamycin NEGATIVE Sensitive OXACILLIN >=4 RESISTANT Resistant RIFAMPIN <=0.5 SENSI... Sensitive TETRACYCLINE <=1 SENSITIVE Sensitive TRIMETH/SULFA <=10 SENSIT Sensitive ... VANCOMYCIN 1 SENSITIVE Sensitive This is a patient we had in clinic earlier this year with a wound over his left fifth metatarsal head. 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 Doran Durand on 05/16/2019. He seems to have developed problems with wounds on his bilateral feet in June 2021 although he may have had problems earlier than this. He was in an urgent care with a right foot ulcer on 09/26/2019 and given a course of  doxycycline. This was apparently after having trouble getting into see orthopedics. He was seen by podiatry on 09/28/2019 noted to have bilateral lower extremity ulcers including the left lateral fifth metatarsal base and the right subfifth met head. It was noted that had purulent drainage at that time. He required hospitalization from 6/20 through 7/2. This was because of worsening right foot wounds. He underwent bilateral operative incision and drainage and bone biopsies bilaterally. Culture results are listed above. He has been referred back to clinic by Dr. Jacqualyn Posey of podiatry. He is also followed by Dr. Megan Salon who saw him yesterday. He was discharged from hospital on Zyvox Flagyl and Levaquin and yesterday changed to doxycycline Flagyl and Levaquin. His inflammatory markers on 6/26 showed a sedimentation rate of 129 and a C-reactive protein of 5. This is improved to 14 and 1.3 respectively. This would indicate improvement. ABIs in our clinic today were 1.23 on the right and 1.20 on the left 11/01/2019 on evaluation today patient appears to be doing fairly well in regard to the wounds on his feet at this point. Fortunately there is no signs of active infection at this time. No fevers, chills, nausea, vomiting, or diarrhea. He currently is seeing infectious disease and still under their care at this point. Subsequently he also has both wounds which she has not been using collagen on as he did not receive that in his packaging he did not call us and let us know that. Apparently that just was missed on the order. Nonetheless we will get that straightened out today. 8/9-Patient returns for bilateral foot wounds, using Prisma with hydrogel moistened dressings, and the wounds appear stable. Patient using surgical shoes, avoiding much pressure or weightbearing as much as possible 8/16; patient has bilateral foot wounds. 1 on the right lateral foot proximally the other is on the left mid lateral foot. Both  required debridement of callus and thick skin around the wounds. We have been using silver collagen 8/27; patient has bilateral lateral foot wounds. The area on the left substantially surrounded by callus and dry skin. This was removed from the wound edge. The underlying wound is small. The area on the right measured somewhat smaller today. 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 Unfortunately I did not update his antibiotics during today's visit. 9/10 I reviewed  Dr. Hale Bogus last notes he felt he had completed antibiotics his inflammatory markers were reasonably well controlled. He has a small wound on the lateral left foot and a tiny area on the right which is just above closed. He is using Hydrofera Blue with border foam he has bilateral surgical shoes 9/24; 2 week f/u. doing well. right foot is closed. left foot still undermined. 10/14; right foot remains closed at the fifth met head. The area over the base of the left fifth metatarsal has a small open area but considerable undermining towards the plantar foot. Thick callus skin around this suggests an adequate pressure relief. We have talked about this. He says he is going to go back into his cam boot. I suggested a total contact cast he did not seem enamored with this suggestion 10/26; left foot base of the fifth metatarsal. Same condition as last time. He has skin over the area with an open wound however the skin is not adherent. He went to see Dr. Earleen Newport who did an x-ray and culture of his foot I have not reviewed the x-ray but the patient was not told anything. He is on doxycycline 11/11; since the patient was last here he was in the emergency room on 10/30 he was concerned about swelling in the left foot. They did not do any cultures or x-rays. They changed his antibiotics to cephalexin. Previous culture showed group B strep. The cephalexin is appropriate as doxycycline  has less than predictable coverage. Arrives in clinic today with swelling over this area under the wound. He also has a new wound on the right fifth metatarsal head Objective Constitutional Sitting or standing Blood Pressure is within target range for patient.. Pulse regular and within target range for patient.Marland Kitchen Respirations regular, non-labored and within target range.. Temperature is normal and within the target range for the patient.Marland Kitchen Appears in no distress. Vitals Time Taken: 1:55 PM, Height: 77 in, Weight: 280 lbs, BMI: 33.2, Temperature: 98.3 F, Pulse: 98 bpm, Respiratory Rate: 18 breaths/min, Blood Pressure: 127/92 mmHg, Capillary Blood Glucose: 163 mg/dl. General Notes: glucose per pt report Cardiovascular Pedal pulses palpable and strong bilaterally.. General Notes: Wound exam ooLeft lateral fifth metatarsal at the base. This entire area is swollen. Almost ballotable. I used a #15 scalpel and opened this I got a sanguinous drainage which I have cultured. ooHe has a new wound that has been there for about 10 days on the right lateral fifth metatarsal head this is superficial and does not look ominous. He was not wearing anything in this area in terms of offloading Integumentary (Hair, Skin) Wound #3 status is Open. Original cause of wound was Trauma. The wound is located on the Left,Lateral Foot. The wound measures 2.5cm length x 2.4cm width x 0.4cm depth; 4.712cm^2 area and 1.885cm^3 volume. There is Fat Layer (Subcutaneous Tissue) exposed. There is no tunneling noted, however, there is undermining starting at 3:00 and ending at 6:00 with a maximum distance of 0.5cm. There is a medium amount of serosanguineous drainage noted. The wound margin is flat and intact. There is large (67-100%) red, pink granulation within the wound bed. There is a small (1-33%) amount of necrotic tissue within the wound bed including Adherent Slough. Wound #4 status is Open. Original cause of wound was  Gradually Appeared. The wound is located on the Right,Lateral Foot. The wound measures 0.9cm length x 1.3cm width x 0.1cm depth; 0.919cm^2 area and 0.092cm^3 volume. There is Fat Layer (Subcutaneous Tissue) exposed. There is no  tunneling or undermining noted. There is a medium amount of serosanguineous drainage noted. The wound margin is flat and intact. There is large (67-100%) pink granulation within the wound bed. There is a small (1-33%) amount of necrotic tissue within the wound bed including Adherent Slough. 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 Dressing Change Frequency: Change Dressing every other day. Skin Barriers/Peri-Wound Care: Moisturizing lotion - both legs and feet Wound Cleansing: May shower and wash wound with soap and water. - on days that dressing is changed Primary Wound Dressing: Wound #3 Left,Lateral Foot: Calcium Alginate with Silver Wound #4 Right,Lateral Foot: Calcium Alginate with Silver Secondary Dressing: Wound #3 Left,Lateral Foot: Foam - foam donut. Kerlix/Rolled Gauze Dry Gauze Wound #4 Right,Lateral Foot: Foam - foam donut. Kerlix/Rolled Gauze Dry Gauze Off-Loading: Open toe surgical shoe to: - left foot Radiology ordered were: MRI, left foot with and without contrast - MRI of left lateral foot with and without contrast related to nonhealing diabetic ulcer looking for infection/osteomyelitis. CPT Code The following medication(s) was prescribed: cephalexin oral 500 mg capsule 1 capsule oral qid for 7 days (continuing rx) for wound infection starting 02/13/2020 #1 I am concerned about the left lateral foot. Swollen ballotable wound. I was expecting to get an abscess however what I got  was sanguinous drainage. I have cultured this in any case I am going to extend his Keflex for another week 2. He is going to need an additional MRI of the left foot with contrast rule out osteomyelitis abscess 3. New wound on the right lateral fifth metatarsal head we are going to use silver alginate and foam to this area. Electronic Signature(s) Signed: 02/13/2020 2:52:03 PM By: [name] MD Entered By: [name] on 02/13/2020 14:52:02 -------------------------------------------------------------------------------- SuperBill Details Patient Name: Date of Service: [name], [name] 02/13/2020 Medical Record Number: [id number] Patient Account Number: [id number] Date of Birth/Sex: Treating RN: 17-Feb-1986 (34 y.o. [name] Primary Care Provider: [name] Other Clinician: Referring Provider: Treating Provider/Extender: [name] in Treatment: 16 Diagnosis Coding ICD-10 Codes Code Description E11.621 Type 2 diabetes mellitus with foot ulcer L97.528 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ity L97.518 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ity Facility Procedures CPT4 Code: [id number] Description: [phone] - WOUND CARE VISIT-LEV 5 EST PT Modifier: Quantity: 1 Physician Procedures : CPT4 Code Description Modifier [id number] - WC PHYS LEVEL 4 - EST PT ICD-10 Diagnosis Description E11.621 Type 2 diabetes mellitus with foot ulcer L97.528 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ity L97.518  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ity Quantity: 1 Electronic Signature(s) Signed: 02/13/2020 5:00:15 PM By: [name] MD Signed: 02/13/2020 5:09:44 PM By: Deon Pilling Entered By: Deon Pilling on 02/13/2020 15:38:36

## 2020-02-16 LAB — AEROBIC CULTURE W GRAM STAIN (SUPERFICIAL SPECIMEN)
Culture: NO GROWTH
Gram Stain: NONE SEEN

## 2020-02-18 NOTE — Progress Notes (Signed)
[place], [name] ([id number]) Visit Report for 02/13/2020 Arrival Information Details Patient Name: Date of Service: [name] D 02/13/2020 1:30 PM Medical Record Number: [id number] Patient Account Number: [id number] Date of Birth/Sex: Treating RN: [birth year] (34 y.o. [name] Primary Care Wynston Romey: [name] Other Clinician: Referring Idaly Verret: Treating Gelena Klosinski/Extender: Gwyneth Revels in Treatment: 16 Visit Information History Since Last Visit Added or deleted any medications: No Patient Arrived: Ambulatory Any new allergies or adverse reactions: No Arrival Time: 13:51 Had a fall or experienced change in No Accompanied By: alone activities of daily living that may affect Transfer Assistance: None risk of falls: Patient Identification Verified: Yes Signs or symptoms of abuse/neglect since last visito No Secondary Verification Process Completed: Yes Hospitalized since last visit: No Patient Requires Transmission-Based Precautions: No Implantable device outside of the clinic excluding No Patient Has Alerts: No cellular tissue based products placed in the center since last visit: Has Dressing in Place as Prescribed: Yes Pain Present Now: No Electronic Signature(s) Signed: 02/17/2020 5:51:53 PM By: Zandra Abts RN, BSN Entered By: Zandra Abts on 02/13/2020 13:53:02 -------------------------------------------------------------------------------- Clinic Level of Care Assessment Details Patient Name: Date of Service: [name], [name] 02/13/2020 1:30 PM Medical Record Number: [id number] Patient Account Number: [id number] Date of Birth/Sex: Treating RN: 11/21/1985 (34 y.o. [name] Primary Care Debbe Crumble: [name] Other Clinician: Referring Katina Remick: Treating Jaeleigh Monaco/Extender: Gwyneth Revels in Treatment: 16 Clinic Level of Care Assessment Items TOOL 4 Quantity Score X- 1 0 Use when only an  EandM is performed on FOLLOW-UP visit ASSESSMENTS - Nursing Assessment / Reassessment X- 1 10 Reassessment of Co-morbidities (includes updates in patient status) X- 1 5 Reassessment of Adherence to Treatment Plan ASSESSMENTS - Wound and Skin A ssessment / Reassessment []  - 0 Simple Wound Assessment / Reassessment - one wound X- 2 5 Complex Wound Assessment / Reassessment - multiple wounds X- 1 10 Dermatologic / Skin Assessment (not related to wound area) ASSESSMENTS - Focused Assessment X- 2 5 Circumferential Edema Measurements - multi extremities X- 1 10 Nutritional Assessment / Counseling / Intervention []  - 0 Lower Extremity Assessment (monofilament, tuning fork, pulses) []  - 0 Peripheral Arterial Disease Assessment (using hand held doppler) ASSESSMENTS - Ostomy and/or Continence Assessment and Care []  - 0 Incontinence Assessment and Management []  - 0 Ostomy Care Assessment and Management (repouching, etc.) PROCESS - Coordination of Care []  - 0 Simple Patient / Family Education for ongoing care X- 1 20 Complex (extensive) Patient / Family Education for ongoing care X- 1 10 Staff obtains , Records, T Results / Process Orders est []  - 0 Staff telephones HHA, Nursing Homes / Clarify orders / etc []  - 0 Routine Transfer to another Facility (non-emergent condition) []  - 0 Routine Hospital Admission (non-emergent condition) []  - 0 New Admissions / / Ordering NPWT Apligraf, etc. , []  - 0 Emergency Hospital Admission (emergent condition) []  - 0 Simple Discharge Coordination X- 1 15 Complex (extensive) Discharge Coordination PROCESS - Special Needs []  - 0 Pediatric / Minor Patient Management []  - 0 Isolation Patient Management []  - 0 Hearing / Language / Visual special needs []  - 0 Assessment of Community assistance (transportation, D/C planning, etc.) []  - 0 Additional assistance / Altered mentation []  - 0 Support Surface(s)  Assessment (bed, cushion, seat, etc.) INTERVENTIONS - Wound Cleansing / Measurement []  - 0 Simple Wound Cleansing - one wound X- 2 5 Complex Wound Cleansing - multiple wounds X- 1  5 Wound Imaging (photographs - any number of wounds) []  - 0 Wound Tracing (instead of photographs) []  - 0 Simple Wound Measurement - one wound X- 2 5 Complex Wound Measurement - multiple wounds INTERVENTIONS - Wound Dressings []  - 0 Small Wound Dressing one or multiple wounds X- 2 15 Medium Wound Dressing one or multiple wounds []  - 0 Large Wound Dressing one or multiple wounds []  - 0 Application of Medications - topical []  - 0 Application of Medications - injection INTERVENTIONS - Miscellaneous []  - 0 External ear exam []  - 0 Specimen Collection (cultures, biopsies, blood, body fluids, etc.) X- 1 5 Specimen(s) / Culture(s) sent or taken to Lab for analysis []  - 0 Patient Transfer (multiple staff / Nurse, adultHoyer Lift / Similar devices) []  - 0 Simple Staple / Suture removal (25 or less) []  - 0 Complex Staple / Suture removal (26 or more) []  - 0 Hypo / Hyperglycemic Management (close monitor of Blood Glucose) []  - 0 Ankle / Brachial Index (ABI) - do not check if billed separately X- 1 5 Vital Signs Has the patient been seen at the hospital within the last three years: Yes Total Score: 165 Level Of Care: New/Established - Level 5 Electronic Signature(s) Signed: 02/13/2020 5:09:44 PM By: Shawn Stalleaton, Bobbi Entered By: Shawn Stalleaton, Bobbi on 02/13/2020 15:06:29 -------------------------------------------------------------------------------- Encounter Discharge Information Details Patient Name: Date of Service: [name], [name] 02/13/2020 1:30 PM Medical Record Number: [id number] Patient Account Number: [id number] Date of Birth/Sex: Treating RN: 22-Jun-1985 (34 y.o. [name]) Boehlein, Linda Primary Care Valyn Latchford: Renford DillsPolite, Ronald Other Clinician: Referring Savana Spina: Treating Nameer Summer/Extender: Gwyneth Revelsobson,  Michael [name], Ronald Weeks in Treatment: 16 Encounter Discharge Information Items Discharge Condition: Stable Ambulatory Status: Ambulatory Discharge Destination: Home Transportation: Private Auto Accompanied By: self Schedule Follow-up Appointment: Yes Clinical Summary of Care: Patient Declined Electronic Signature(s) Signed: 02/13/2020 5:21:57 PM By: Zenaida DeedBoehlein, Linda RN, BSN Entered By: Zenaida DeedBoehlein, Linda on 02/13/2020 14:45:50 -------------------------------------------------------------------------------- Lower Extremity Assessment Details Patient Name: Date of Service: [name] RMSTRO NG, [name] 02/13/2020 1:30 PM Medical Record Number: [id number] Patient Account Number: [id number] Date of Birth/Sex: Treating RN: 22-Jun-1985 (34 y.o. Elizebeth KollerM) Lynch, Shatara Primary Care Ramona Ruark: Renford DillsPolite, Ronald Other Clinician: Referring Belford Pascucci: Treating Zayvier Caravello/Extender: Gwyneth Revelsobson, Michael [name], Ronald Weeks in Treatment: 16 Edema Assessment Assessed: [name] Edema: [Left: No] [Right: No] Calf Left: Right: Point of Measurement: 48 cm From Medial Instep 51 cm 48 cm Ankle Left: Right: Point of Measurement: 10 cm From Medial Instep 31 cm 31 cm Vascular Assessment Pulses: Dorsalis Pedis Palpable: [Left:Yes] [Right:Yes] Electronic Signature(s) Signed: 02/17/2020 5:51:53 PM By: Zandra AbtsLynch, Shatara RN, BSN Entered By: Zandra AbtsLynch, Shatara on 02/13/2020 14:10:46 -------------------------------------------------------------------------------- Multi Wound Chart Details Patient Name: Date of Service: [name], [name] 02/13/2020 1:30 PM Medical Record Number: [id number] Patient Account Number: [id number] Date of Birth/Sex: Treating RN: 22-Jun-1985 (34 y.o. [name]) Deaton, Bobbi Primary Care Henya Aguallo: Renford DillsPolite, Ronald Other Clinician: Referring Abhiram Criado: Treating Gabriela Giannelli/Extender: Gwyneth Revelsobson, Michael [name], Ronald Weeks in Treatment: 16 Vital Signs Height(in): 77 Capillary Blood Glucose(mg/dl):  259163 Weight(lbs): 563280 Pulse(bpm): 98 Body Mass Index(BMI): 33 Blood Pressure(mmHg): 127/92 Temperature(F): 98.3 Respiratory Rate(breaths/min): 18 Photos: [3:No Photos Left, Lateral Foot] [4:No Photos Right, Lateral Foot] [N/A:N/A N/A] Wound Location: [3:Trauma] [4:Gradually Appeared] [N/A:N/A] Wounding Event: [3:Diabetic Wound/Ulcer of the Lower] [4:Diabetic Wound/Ulcer of the Lower] [N/A:N/A] Primary Etiology: [3:Extremity Type II Diabetes] [4:Extremity Type II Diabetes] [N/A:N/A] Comorbid History: [3:10/02/2019] [4:02/06/2020] [N/A:N/A] Date Acquired: [3:16] [4:0] [N/A:N/A] Weeks of Treatment: [3:Open] [4:Open] [N/A:N/A] Wound Status: [3:2.5x2.4x0.4] [4:0.9x1.3x0.1] [N/A:N/A] Measurements L  x W x D (cm) [3:4.712] [4:0.919] [N/A:N/A] A (cm) : rea [3:1.885] [4:0.092] [N/A:N/A] Volume (cm) : [3:-185.70%] [4:N/A] [N/A:N/A] % Reduction in A rea: [3:-1042.40%] [4:N/A] [N/A:N/A] % Reduction in Volume: [3:3] Starting Position 1 (o'clock): [3:6] Ending Position 1 (o'clock): [3:0.5] Maximum Distance 1 (cm): [3:Yes] [4:No] [N/A:N/A] Undermining: [3:Grade 2] [4:Grade 2] [N/A:N/A] Classification: [3:Medium] [4:Medium] [N/A:N/A] Exudate A mount: [3:Serosanguineous] [4:Serosanguineous] [N/A:N/A] Exudate Type: [3:red, brown] [4:red, brown] [N/A:N/A] Exudate Color: [3:Flat and Intact] [4:Flat and Intact] [N/A:N/A] Wound Margin: [3:Large (67-100%)] [4:Large (67-100%)] [N/A:N/A] Granulation A mount: [3:Red, Pink] [4:Pink] [N/A:N/A] Granulation Quality: [3:Small (1-33%)] [4:Small (1-33%)] [N/A:N/A] Necrotic A mount: [3:Fat Layer (Subcutaneous Tissue): Yes Fat Layer (Subcutaneous Tissue): Yes N/A] Exposed Structures: [3:Fascia: No Tendon: No Muscle: No Joint: No Bone: No Small (1-33%)] [4:Fascia: No Tendon: No Muscle: No Joint: No Bone: No None] [N/A:N/A] Treatment Notes Electronic Signature(s) Signed: 02/13/2020 5:00:15 PM By: Baltazar Najjar MD Signed: 02/13/2020 5:09:44 PM By: Shawn Stall Entered By: Baltazar Najjar on 02/13/2020 14:40:25 -------------------------------------------------------------------------------- Multi-Disciplinary Care Plan Details Patient Name: Date of Service: Caswell Corwin NG, [name] 02/13/2020 1:30 PM Medical Record Number: [id number] Patient Account Number: [id number] Date of Birth/Sex: Treating RN: 15-Feb-1986 (34 y.o. [name] Primary Care Quinlin Conant: [name] Other Clinician: Referring Makiah Clauson: Treating Star Cheese/Extender: Gwyneth Revels in Treatment: 16 Active Inactive Nutrition Nursing Diagnoses: Imbalanced nutrition Potential for alteratiion in Nutrition/Potential for imbalanced nutrition Goals: Patient/caregiver agrees to and verbalizes understanding of need to use nutritional supplements and/or vitamins as prescribed Date Initiated: 10/24/2019 Target Resolution Date: 03/06/2020 Goal Status: Active Patient/caregiver will maintain therapeutic glucose control Date Initiated: 10/24/2019 Target Resolution Date: 03/06/2020 Goal Status: Active Interventions: Assess HgA1c results as ordered upon admission and as needed Assess patient nutrition upon admission and as needed per policy Provide education on elevated blood sugars and impact on wound healing Provide education on nutrition Treatment Activities: Education provided on Nutrition : 11/01/2019 Notes: Electronic Signature(s) Signed: 02/13/2020 5:09:44 PM By: Shawn Stall Entered By: Shawn Stall on 02/13/2020 14:07:19 -------------------------------------------------------------------------------- Pain Assessment Details Patient Name: Date of Service: [name] D 02/13/2020 1:30 PM Medical Record Number: [id number] Patient Account Number: [id number] Date of Birth/Sex: Treating RN: [birth year] (34 y.o. [name] Primary Care Saidy Ormand: [name] Other Clinician: Referring Samya Siciliano: Treating Levester Waldridge/Extender: Gwyneth Revels in Treatment: 16 Active Problems Location of Pain Severity and Description of Pain Patient Has Paino No Site Locations Pain Management and Medication Current Pain Management: Electronic Signature(s) Signed: 02/17/2020 5:51:53 PM By: Zandra Abts RN, BSN Entered By: Zandra Abts on 02/13/2020 13:58:50 -------------------------------------------------------------------------------- Patient/Caregiver Education Details Patient Name: Date of Service: [name] D 11/11/2021andnbsp1:30 PM Medical Record Number: [id number] Patient Account Number: [id number] Date of Birth/Gender: Treating RN: March 03, 1986 (34 y.o. [name] Primary Care Physician: [name] Other Clinician: Referring Physician: Treating Physician/Extender: Gwyneth Revels in Treatment: 16 Education Assessment Education Provided To: Patient Education Topics Provided Elevated Blood Sugar/ Impact on Healing: Handouts: Elevated Blood Sugars: How Do They Affect Wound Healing Methods: Explain/Verbal Responses: Reinforcements needed Electronic Signature(s) Signed: 02/13/2020 5:09:44 PM By: Shawn Stall Entered By: Shawn Stall on 02/13/2020 14:07:32 -------------------------------------------------------------------------------- Wound Assessment Details Patient Name: Date of Service: [name] D 02/13/2020 1:30 PM Medical Record Number: [id number] Patient Account Number: [id number] Date of Birth/Sex: Treating RN: Sep 29, 1985 (34 y.o. [name] Primary Care Wilber Fini: [name] Other Clinician: Referring Kathrine Rieves: Treating Taneil Lazarus/Extender: Gwyneth Revels in Treatment: 16 Wound Status  Wound Number: 3 Primary Etiology: Diabetic Wound/Ulcer of the Lower Extremity Wound Location: Left, Lateral Foot Wound Status: Open Wounding Event: Trauma Comorbid History: Type II Diabetes Date Acquired:  10/02/2019 Weeks Of Treatment: 16 Clustered Wound: No Wound Measurements Length: (cm) 2.5 Width: (cm) 2.4 Depth: (cm) 0.4 Area: (cm) 4.712 Volume: (cm) 1.885 % Reduction in Area: -185.7% % Reduction in Volume: -1042.4% Epithelialization: Small (1-33%) Tunneling: No Undermining: Yes Starting Position (o'clock): 3 Ending Position (o'clock): 6 Maximum Distance: (cm) 0.5 Wound Description Classification: Grade 2 Wound Margin: Flat and Intact Exudate Amount: Medium Exudate Type: Serosanguineous Exudate Color: red, brown Foul Odor After Cleansing: No Slough/Fibrino Yes Wound Bed Granulation Amount: Large (67-100%) Exposed Structure Granulation Quality: Red, Pink Fascia Exposed: No Necrotic Amount: Small (1-33%) Fat Layer (Subcutaneous Tissue) Exposed: Yes Necrotic Quality: Adherent Slough Tendon Exposed: No Muscle Exposed: No Joint Exposed: No Bone Exposed: No Treatment Notes Wound #3 (Left, Lateral Foot) 2. Periwound Care Skin Prep 3. Primary Dressing Applied Calcium Alginate Ag 4. Secondary Dressing Dry Gauze Foam 5. Secured With Tape 7. Footwear/Offloading device applied Surgical shoe Electronic Signature(s) Signed: 02/17/2020 5:51:53 PM By: Zandra Abts RN, BSN Entered By: Zandra Abts on 02/13/2020 14:10:17 -------------------------------------------------------------------------------- Wound Assessment Details Patient Name: Date of Service: [name], [name] 02/13/2020 1:30 PM Medical Record Number: [id number] Patient Account Number: [id number] Date of Birth/Sex: Treating RN: 06-19-1985 (34 y.o. [name] Primary Care Leondra Cullin: [name] Other Clinician: Referring Skylur Fuston: Treating Fredis Malkiewicz/Extender: Gwyneth Revels in Treatment: 16 Wound Status Wound Number: 4 Primary Etiology: Diabetic Wound/Ulcer of the Lower Extremity Wound Location: Right, Lateral Foot Wound Status: Open Wounding Event: Gradually  Appeared Comorbid History: Type II Diabetes Date Acquired: 02/06/2020 Weeks Of Treatment: 0 Clustered Wound: No Wound Measurements Length: (cm) 0.9 Width: (cm) 1.3 Depth: (cm) 0.1 Area: (cm) 0.919 Volume: (cm) 0.092 % Reduction in Area: % Reduction in Volume: Epithelialization: None Tunneling: No Undermining: No Wound Description Classification: Grade 2 Wound Margin: Flat and Intact Exudate Amount: Medium Exudate Type: Serosanguineous Exudate Color: red, brown Foul Odor After Cleansing: No Slough/Fibrino Yes Wound Bed Granulation Amount: Large (67-100%) Exposed Structure Granulation Quality: Pink Fascia Exposed: No Necrotic Amount: Small (1-33%) Fat Layer (Subcutaneous Tissue) Exposed: Yes Necrotic Quality: Adherent Slough Tendon Exposed: No Muscle Exposed: No Joint Exposed: No Bone Exposed: No Treatment Notes Wound #4 (Right, Lateral Foot) 2. Periwound Care Skin Prep 3. Primary Dressing Applied Calcium Alginate Ag 4. Secondary Dressing Dry Gauze Foam 5. Secured With Tape 7. Footwear/Offloading device applied Surgical shoe Electronic Signature(s) Signed: 02/17/2020 5:51:53 PM By: Zandra Abts RN, BSN Entered By: Zandra Abts on 02/13/2020 14:09:13 -------------------------------------------------------------------------------- Vitals Details Patient Name: Date of Service: [name], [name] 02/13/2020 1:30 PM Medical Record Number: [id number] Patient Account Number: [id number] Date of Birth/Sex: Treating RN: 1985/12/08 (34 y.o. [name] Primary Care Vicki Chaffin: [name] Other Clinician: Referring Liah Morr: Treating Aneisa Karren/Extender: Gwyneth Revels in Treatment: 16 Vital Signs Time Taken: 13:55 Temperature (F): 98.3 Height (in): 77 Pulse (bpm): 98 Weight (lbs): 280 Respiratory Rate (breaths/min): 18 Body Mass Index (BMI): 33.2 Blood Pressure (mmHg): 127/92 Capillary Blood Glucose (mg/dl): 016 Reference  Range: 80 - 120 mg / dl Notes glucose per pt report Electronic Signature(s) Signed: 02/17/2020 5:51:53 PM By: Zandra Abts RN, BSN Entered By: Zandra Abts on 02/13/2020 13:56:40

## 2020-02-19 ENCOUNTER — Other Ambulatory Visit (HOSPITAL_COMMUNITY): Payer: Self-pay | Admitting: Internal Medicine

## 2020-02-19 ENCOUNTER — Encounter (INDEPENDENT_AMBULATORY_CARE_PROVIDER_SITE_OTHER): Payer: BC Managed Care – PPO | Admitting: Ophthalmology

## 2020-02-19 ENCOUNTER — Other Ambulatory Visit: Payer: Self-pay

## 2020-02-19 ENCOUNTER — Ambulatory Visit (HOSPITAL_COMMUNITY)
Admission: RE | Admit: 2020-02-19 | Discharge: 2020-02-19 | Disposition: A | Discharge status: Home / Self Care | Payer: BC Managed Care – PPO | Source: Ambulatory Visit | Attending: Internal Medicine | Admitting: Internal Medicine

## 2020-02-19 ENCOUNTER — Encounter (HOSPITAL_COMMUNITY): Payer: Self-pay

## 2020-02-19 ENCOUNTER — Ambulatory Visit (HOSPITAL_COMMUNITY): Admission: RE | Admit: 2020-02-19 | Payer: BC Managed Care – PPO | Source: Ambulatory Visit

## 2020-02-19 DIAGNOSIS — L98491 Non-pressure chronic ulcer of skin of other sites limited to breakdown of skin: Secondary | ICD-10-CM | POA: Insufficient documentation

## 2020-02-20 ENCOUNTER — Encounter (HOSPITAL_BASED_OUTPATIENT_CLINIC_OR_DEPARTMENT_OTHER): Payer: BC Managed Care – PPO | Admitting: Internal Medicine

## 2020-02-20 DIAGNOSIS — E11621 Type 2 diabetes mellitus with foot ulcer: Secondary | ICD-10-CM | POA: Diagnosis not present

## 2020-02-20 NOTE — Progress Notes (Signed)
[place], [name] ([id number]) Visit Report for 02/20/2020 Arrival Information Details Patient Name: Date of Service: [name] RMSTRO NG, [name] 02/20/2020 3:30 PM Medical Record Number: [id number] Patient Account Number: [id number] Date of Birth/Sex: Treating [name]: 03/08/1986 (34 y.o. [name]) [name] Primary Care Analeah Brame: [name] Other Clinician: Referring [name]: Treating [name]/Extender: [name] [name], [name] Weeks in Treatment: 17 Visit Information History Since Last Visit Added or deleted any medications: No Patient Arrived: Ambulatory Any new allergies or adverse reactions: No Arrival Time: 16:05 Had a fall or experienced change in No Accompanied By: self activities of daily living that may affect Transfer Assistance: None risk of falls: Patient Identification Verified: Yes Signs or symptoms of abuse/neglect since last visito No Secondary Verification Process Completed: Yes Hospitalized since last visit: No Patient Requires Transmission-Based Precautions: No Implantable device outside of the clinic excluding No Patient Has Alerts: No cellular tissue based products placed in the center since last visit: Pain Present Now: No Electronic Signature(s) Signed: 02/20/2020 4:44:47 PM By: Max KindlerJones, Dedrick EMT/HBOT/SD Entered By: Max KindlerJones, Dedrick on 02/20/2020 16:20:05 -------------------------------------------------------------------------------- Compression Therapy Details Patient Name: Date of Service: Max [name], [name] 02/20/2020 3:30 PM Medical Record Number: [id number] Patient Account Number: [id number] Date of Birth/Sex: Treating [name]: 03/08/1986 (34 y.o. [name]) Deaton, [name] Primary Care Rakiyah Esch: [name] Other Clinician: Referring Braxdon Gappa: Treating Mily Malecki/Extender: [name] [name], [name] Weeks in Treatment: 17 Compression Therapy Performed for Wound Assessment: Wound #3 Left,Lateral Foot Performed By: Clinician [name] Compression Type:  Three Layer Post Procedure Diagnosis Same as Pre-procedure Electronic Signature(s) Signed: 02/20/2020 5:38:55 PM By: [name] Entered By: [name] on 02/20/2020 17:24:22 -------------------------------------------------------------------------------- Compression Therapy Details Patient Name: Date of Service: [name] RMSTRO NG, [name] 02/20/2020 3:30 PM Medical Record Number: [id number] Patient Account Number: [id number] Date of Birth/Sex: Treating [name]: 03/08/1986 (34 y.o. [name]) Deaton, [name] Primary Care Parker Wherley: [name] Other Clinician: Referring Marea Reasner: Treating Lylie Blacklock/Extender: [name] [name], [name] Weeks in Treatment: 17 Compression Therapy Performed for Wound Assessment: Wound #4 Right,Lateral Foot Performed By: Clinician [name] Compression Type: Three Layer Post Procedure Diagnosis Same as Pre-procedure Electronic Signature(s) Signed: 02/20/2020 5:38:55 PM By: [name] Entered By: [name] on 02/20/2020 17:24:22 -------------------------------------------------------------------------------- Encounter Discharge Information Details Patient Name: Date of Service: Max [name], [name] 02/20/2020 3:30 PM Medical Record Number: [id number] Patient Account Number: [id number] Date of Birth/Sex: Treating [name]: 03/08/1986 (34 y.o. [name]) [name] Primary Care Ardella Chhim: [name] Other Clinician: Referring Marquist Binstock: Treating Lacosta Hargan/Extender: [name] [name], [name] Weeks in Treatment: 17 Encounter Discharge Information Items Post Procedure Vitals Discharge Condition: Stable Temperature (F): 98.4 Ambulatory Status: Ambulatory Pulse (bpm): 89 Discharge Destination: Home Respiratory Rate (breaths/min): 17 Transportation: Private Auto Blood Pressure (mmHg): 155/96 Accompanied By: self Schedule Follow-up Appointment: Yes Clinical Summary of Care: Patient Declined Electronic Signature(s) Signed: 02/20/2020 5:30:22 PM By:  Yevonne PaxEpps, Carrie [name] Entered By: Yevonne PaxEpps, [name] on 02/20/2020 17:03:04 -------------------------------------------------------------------------------- Lower Extremity Assessment Details Patient Name: Date of Service: [name] RMSTRO NG, [name] 02/20/2020 3:30 PM Medical Record Number: [id number] Patient Account Number: [id number] Date of Birth/Sex: Treating [name]: 03/08/1986 (34 y.o. [name]) Deaton, [name] Primary Care Ramirez Fullbright: [name] Other Clinician: Referring Ranetta Armacost: Treating Hero Mccathern/Extender: [name] [name], [name] Weeks in Treatment: 17 Edema Assessment Assessed: [name][Left: No] [name][Right: No] Edema: [Left: No] [Right: No] Calf Left: Right: Point of Measurement: 48 cm From Medial Instep 51 cm 48 cm Ankle Left: Right: Point of Measurement: 10 cm From Medial Instep 31 cm 31 cm Electronic Signature(s) Signed: 02/20/2020 4:44:47  PM By: [name] EMT/HBOT/SD Signed: 02/20/2020 5:38:55 PM By: Max Stall Entered By: [name] on 02/20/2020 16:20:50 -------------------------------------------------------------------------------- Multi Wound Chart Details Patient Name: Date of Service: [name], [name] 02/20/2020 3:30 PM Medical Record Number: [id number] Patient Account Number: [id number] Date of Birth/Sex: Treating [name]: [birth year] (34 y.o. [name] Primary Care Tyliek Timberman: Max Dills Other Clinician: Referring Quayshaun Hubbert: Treating [name]/Extender: Max Revels in Treatment: 17 Vital Signs Height(in): 77 Pulse(bpm): 89 Weight(lbs): 280 Blood Pressure(mmHg): 155/96 Body Mass Index(BMI): 33 Temperature(F): 98.4 Respiratory Rate(breaths/min): 17 Photos: [3:No Photos Left, Lateral Foot] [4:No Photos Right, Lateral Foot] [N/A:N/A N/A] Wound Location: [3:Trauma] [4:Gradually Appeared] [N/A:N/A] Wounding Event: [3:Diabetic Wound/Ulcer of the Lower] [4:Diabetic Wound/Ulcer of the Lower] [N/A:N/A] Primary Etiology: [3:Extremity Type II  Diabetes] [4:Extremity Type II Diabetes] [N/A:N/A] Comorbid History: [3:10/02/2019] [4:02/06/2020] [N/A:N/A] Date Acquired: [3:17] [4:1] [N/A:N/A] Weeks of Treatment: [3:Open] [4:Open] [N/A:N/A] Wound Status: [3:2.7x2.3x0.2] [4:0.6x1.6x0.1] [N/A:N/A] Measurements L x W x D (cm) [3:4.877] [4:0.754] [N/A:N/A] A (cm) : rea [3:0.975] [4:0.075] [N/A:N/A] Volume (cm) : [3:-195.80%] [4:18.00%] [N/A:N/A] % Reduction in A [3:rea: -490.90%] [4:18.50%] [N/A:N/A] % Reduction in Volume: [3:2] Starting Position 1 (o'clock): [3:4] Ending Position 1 (o'clock): [3:0.6] Maximum Distance 1 (cm): [3:Yes] [4:No] [N/A:N/A] Undermining: [3:Grade 2] [4:Grade 2] [N/A:N/A] Classification: [3:Medium] [4:Medium] [N/A:N/A] Exudate A mount: [3:Serosanguineous] [4:Serosanguineous] [N/A:N/A] Exudate Type: [3:red, brown] [4:red, brown] [N/A:N/A] Exudate Color: [3:Flat and Intact] [4:Flat and Intact] [N/A:N/A] Wound Margin: [3:Large (67-100%)] [4:Medium (34-66%)] [N/A:N/A] Granulation A mount: [3:Red, Pink] [4:Pink] [N/A:N/A] Granulation Quality: [3:Small (1-33%)] [4:Medium (34-66%)] [N/A:N/A] Necrotic A mount: [3:Fat Layer (Subcutaneous Tissue): Yes Fat Layer (Subcutaneous Tissue): Yes N/A] Exposed Structures: [3:Fascia: No Tendon: No Muscle: No Joint: No Bone: No Small (1-33%)] [4:Fascia: No Tendon: No Muscle: No Joint: No Bone: No None] [N/A:N/A] Epithelialization: [3:Debridement - Excisional] [4:N/A] [N/A:N/A] Debridement: Pre-procedure Verification/Time Out 16:29 [4:N/A] [N/A:N/A] Taken: [3:Lidocaine 4% Topical Solution] [4:N/A] [N/A:N/A] Pain Control: [3:Subcutaneous, Slough] [4:N/A] [N/A:N/A] Tissue Debrided: [3:Skin/Subcutaneous Tissue] [4:N/A] [N/A:N/A] Level: [3:7.5] [4:N/A] [N/A:N/A] Debridement A (sq cm): [3:rea Curette] [4:N/A] [N/A:N/A] Instrument: [3:Moderate] [4:N/A] [N/A:N/A] Bleeding: [3:Silver Nitrate] [4:N/A] [N/A:N/A] Hemostasis Achieved: [3:0] [4:N/A] [N/A:N/A] Procedural Pain: [3:0] [4:N/A]  [N/A:N/A] Post Procedural Pain: [3:Procedure was tolerated well] [4:N/A] [N/A:N/A] Debridement Treatment Response: [3:2.7x2.3x0.2] [4:N/A] [N/A:N/A] Post Debridement Measurements L x W x D (cm) [3:0.975] [4:N/A] [N/A:N/A] Post Debridement Volume: (cm) [3:Debridement] [4:N/A] [N/A:N/A] Treatment Notes Wound #3 (Left, Lateral Foot) 1. Cleanse With Wound Cleanser Soap and water 2. Periwound Care Moisturizing lotion 3. Primary Dressing Applied Hydrofera Blue 4. Secondary Dressing Dry Gauze 6. Support Layer Applied 3 layer compression wrap Notes moisten hydrofera blue with normal saline , felt Wound #4 (Right, Lateral Foot) 1. Cleanse With Wound Cleanser Soap and water 2. Periwound Care Moisturizing lotion 3. Primary Dressing Applied Hydrofera Blue 4. Secondary Dressing Dry Gauze 6. Support Layer Applied 3 layer compression wrap Notes moisten hydrofera blue with normal saline , felt Electronic Signature(s) Signed: 02/20/2020 5:14:03 PM By: Baltazar Najjar MD Signed: 02/20/2020 5:38:55 PM By: Max Stall Entered By: Baltazar Najjar on 02/20/2020 17:02:15 -------------------------------------------------------------------------------- Multi-Disciplinary Care Plan Details Patient Name: Date of Service: [name], [name] 02/20/2020 3:30 PM Medical Record Number: [id number] Patient Account Number: [id number] Date of Birth/Sex: Treating [name]: 08/26/1985 (34 y.o. [name] Primary Care Neli Fofana: Max Dills Other Clinician: Referring Dayvin Aber: Treating Chayce Rullo/Extender: Max Revels in Treatment: 17 Active Inactive Nutrition Nursing Diagnoses: Imbalanced nutrition Potential for alteratiion in Nutrition/Potential for imbalanced nutrition Goals: Patient/caregiver agrees to and verbalizes understanding  of need to use nutritional supplements and/or vitamins as prescribed Date Initiated: 10/24/2019 Target Resolution Date: 03/06/2020 Goal  Status: Active Patient/caregiver will maintain therapeutic glucose control Date Initiated: 10/24/2019 Target Resolution Date: 03/06/2020 Goal Status: Active Interventions: Assess HgA1c results as ordered upon admission and as needed Assess patient nutrition upon admission and as needed per policy Provide education on elevated blood sugars and impact on wound healing Provide education on nutrition Treatment Activities: Education provided on Nutrition : 12/13/2019 Notes: Electronic Signature(s) Signed: 02/20/2020 5:38:55 PM By: Max Stall Entered By: Max Stall on 02/20/2020 17:22:09 -------------------------------------------------------------------------------- Pain Assessment Details Patient Name: Date of Service: [name] D 02/20/2020 3:30 PM Medical Record Number: [id number] Patient Account Number: [id number] Date of Birth/Sex: Treating [name]: 1985/06/30 (34 y.o. [name] Primary Care Alfreda Hammad: Max Dills Other Clinician: Referring Verle Wheeling: Treating Jaking Thayer/Extender: Max Revels in Treatment: 17 Active Problems Location of Pain Severity and Description of Pain Patient Has Paino No Site Locations Pain Management and Medication Current Pain Management: Electronic Signature(s) Signed: 02/20/2020 4:44:47 PM By: [name] EMT/HBOT/SD Signed: 02/20/2020 5:38:55 PM By: Max Stall Entered By: [name] on 02/20/2020 16:20:45 -------------------------------------------------------------------------------- Patient/Caregiver Education Details Patient Name: Date of Service: [name] D 11/18/2021andnbsp3:30 PM Medical Record Number: [id number] Patient Account Number: [id number] Date of Birth/Gender: Treating [name]: 11-09-1985 (34 y.o. [name] Primary Care Physician: Max Dills Other Clinician: Referring Physician: Treating Physician/Extender: Max Revels in Treatment:  17 Education Assessment Education Provided To: Patient Education Topics Provided Elevated Blood Sugar/ Impact on Healing: Handouts: Elevated Blood Sugars: How Do They Affect Wound Healing Methods: Explain/Verbal Responses: Reinforcements needed Offloading: Handouts: What is Offloadingo Methods: Explain/Verbal Responses: Reinforcements needed Electronic Signature(s) Signed: 02/20/2020 5:38:55 PM By: Max Stall Entered By: Max Stall on 02/20/2020 17:22:27 -------------------------------------------------------------------------------- Wound Assessment Details Patient Name: Date of Service: [name] D 02/20/2020 3:30 PM Medical Record Number: [id number] Patient Account Number: [id number] Date of Birth/Sex: Treating [name]: 1985-06-16 (34 y.o. [name] Primary Care Kavontae Pritchard: Max Dills Other Clinician: Referring Nanetta Wiegman: Treating Jovannie Ulibarri/Extender: Max Revels in Treatment: 17 Wound Status Wound Number: 3 Primary Etiology: Diabetic Wound/Ulcer of the Lower Extremity Wound Location: Left, Lateral Foot Wound Status: Open Wounding Event: Trauma Comorbid History: Type II Diabetes Date Acquired: 10/02/2019 Weeks Of Treatment: 17 Clustered Wound: No Wound Measurements Length: (cm) 2.7 Width: (cm) 2.3 Depth: (cm) 0.2 Area: (cm) 4.877 Volume: (cm) 0.975 % Reduction in Area: -195.8% % Reduction in Volume: -490.9% Epithelialization: Small (1-33%) Tunneling: No Undermining: Yes Starting Position (o'clock): 2 Ending Position (o'clock): 4 Maximum Distance: (cm) 0.6 Wound Description Classification: Grade 2 Wound Margin: Flat and Intact Exudate Amount: Medium Exudate Type: Serosanguineous Exudate Color: red, brown Foul Odor After Cleansing: No Slough/Fibrino Yes Wound Bed Granulation Amount: Large (67-100%) Exposed Structure Granulation Quality: Red, Pink Fascia Exposed: No Necrotic Amount: Small (1-33%) Fat Layer  (Subcutaneous Tissue) Exposed: Yes Necrotic Quality: Adherent Slough Tendon Exposed: No Muscle Exposed: No Joint Exposed: No Bone Exposed: No Treatment Notes Wound #3 (Left, Lateral Foot) 1. Cleanse With Wound Cleanser Soap and water 2. Periwound Care Moisturizing lotion 3. Primary Dressing Applied Hydrofera Blue 4. Secondary Dressing Dry Gauze 6. Support Layer Applied 3 layer compression wrap Notes moisten hydrofera blue with normal saline , felt Electronic Signature(s) Signed: 02/20/2020 5:30:22 PM By: Yevonne Pax [name] Signed: 02/20/2020 5:38:55 PM By: Max Stall Entered By: Yevonne Pax on 02/20/2020 16:23:25 -------------------------------------------------------------------------------- Wound Assessment Details Patient Name: Date of  Service: [name] D 02/20/2020 3:30 PM Medical Record Number: [id number] Patient Account Number: [id number] Date of Birth/Sex: Treating [name]: November 08, 1985 (34 y.o. [name] Primary Care Trevonte Ashkar: Max Dills Other Clinician: Referring Chimene Salo: Treating Luria Rosario/Extender: Max Revels in Treatment: 17 Wound Status Wound Number: 4 Primary Etiology: Diabetic Wound/Ulcer of the Lower Extremity Wound Location: Right, Lateral Foot Wound Status: Open Wounding Event: Gradually Appeared Comorbid History: Type II Diabetes Date Acquired: 02/06/2020 Weeks Of Treatment: 1 Clustered Wound: No Wound Measurements Length: (cm) 0.6 Width: (cm) 1.6 Depth: (cm) 0.1 Area: (cm) 0.754 Volume: (cm) 0.075 % Reduction in Area: 18% % Reduction in Volume: 18.5% Epithelialization: None Tunneling: No Undermining: No Wound Description Classification: Grade 2 Wound Margin: Flat and Intact Exudate Amount: Medium Exudate Type: Serosanguineous Exudate Color: red, brown Foul Odor After Cleansing: No Slough/Fibrino Yes Wound Bed Granulation Amount: Medium (34-66%) Exposed Structure Granulation Quality:  Pink Fascia Exposed: No Necrotic Amount: Medium (34-66%) Fat Layer (Subcutaneous Tissue) Exposed: Yes Necrotic Quality: Adherent Slough Tendon Exposed: No Muscle Exposed: No Joint Exposed: No Bone Exposed: No Treatment Notes Wound #4 (Right, Lateral Foot) 1. Cleanse With Wound Cleanser Soap and water 2. Periwound Care Moisturizing lotion 3. Primary Dressing Applied Hydrofera Blue 4. Secondary Dressing Dry Gauze 6. Support Layer Applied 3 layer compression wrap Notes moisten hydrofera blue with normal saline , felt Electronic Signature(s) Signed: 02/20/2020 5:30:22 PM By: Yevonne Pax [name] Signed: 02/20/2020 5:38:55 PM By: Max Stall Entered By: Yevonne Pax on 02/20/2020 16:24:10 -------------------------------------------------------------------------------- Vitals Details Patient Name: Date of Service: [name], [name] 02/20/2020 3:30 PM Medical Record Number: [id number] Patient Account Number: [id number] Date of Birth/Sex: Treating [name]: 04-Oct-1985 (34 y.o. [name] Primary Care Calirose Mccance: Max Dills Other Clinician: Referring Zo Loudon: Treating Preslyn Warr/Extender: Max Revels in Treatment: 17 Vital Signs Time Taken: 16:10 Temperature (F): 98.4 Height (in): 77 Pulse (bpm): 89 Weight (lbs): 280 Respiratory Rate (breaths/min): 17 Body Mass Index (BMI): 33.2 Blood Pressure (mmHg): 155/96 Reference Range: 80 - 120 mg / dl Electronic Signature(s) Signed: 02/20/2020 5:30:22 PM By: Yevonne Pax [name] Signed: 02/20/2020 5:30:22 PM By: Yevonne Pax [name] Previous Signature: 02/20/2020 4:44:47 PM Version By: [name] EMT/HBOT/SD Entered By: Yevonne Pax on 02/20/2020 17:03:22

## 2020-02-24 ENCOUNTER — Encounter (INDEPENDENT_AMBULATORY_CARE_PROVIDER_SITE_OTHER): Payer: BC Managed Care – PPO | Admitting: Ophthalmology

## 2020-02-25 ENCOUNTER — Other Ambulatory Visit: Payer: Self-pay

## 2020-02-25 ENCOUNTER — Encounter (HOSPITAL_BASED_OUTPATIENT_CLINIC_OR_DEPARTMENT_OTHER): Payer: BC Managed Care – PPO | Admitting: Internal Medicine

## 2020-02-25 DIAGNOSIS — E11621 Type 2 diabetes mellitus with foot ulcer: Secondary | ICD-10-CM | POA: Diagnosis not present

## 2020-02-25 NOTE — Progress Notes (Signed)
[name] ([id number]) Visit Report for 02/25/2020 Arrival Information Details Patient Name: Date of Service: [name] D 02/25/2020 1:30 PM Medical Record Number: [id number] Patient Account Number: [id number] Date of Birth/Sex: Treating RN: 08-15-1985 (34 y.o. M) Primary Care Melvyn Hommes: [name] Other Clinician: Referring Brenten Janney: Treating Kingsly Kloepfer/Extender: Gwyneth Revels in Treatment: 17 Visit Information History Since Last Visit Added or deleted any medications: No Patient Arrived: Ambulatory Any new allergies or adverse reactions: No Arrival Time: 13:30 Had a fall or experienced change in No Accompanied By: self activities of daily living that may affect Transfer Assistance: None risk of falls: Patient Identification Verified: Yes Signs or symptoms of abuse/neglect since last visito No Secondary Verification Process Completed: Yes Hospitalized since last visit: No Patient Requires Transmission-Based Precautions: No Implantable device outside of the clinic excluding No Patient Has Alerts: No cellular tissue based products placed in the center since last visit: Pain Present Now: No Electronic Signature(s) Signed: 02/25/2020 2:35:09 PM By: [name] EMT/HBOT/SD Entered By: [name] on 02/25/2020 14:32:14 -------------------------------------------------------------------------------- Compression Therapy Details Patient Name: Date of Service: [name], [name] 02/25/2020 1:30 PM Medical Record Number: [id number] Patient Account Number: [id number] Date of Birth/Sex: Treating RN: 05-07-1985 (34 y.o. M) Primary Care Monserat Prestigiacomo: [name] Other Clinician: Referring Shaima Sardinas: Treating Karine Garn/Extender: Gwyneth Revels in Treatment: 17 Compression Therapy Performed for Wound Assessment: Wound #3 Left,Lateral Foot Performed By: Michael Litter, Compression Type: Three Layer Electronic  Signature(s) Signed: 02/25/2020 2:35:09 PM By: [name] EMT/HBOT/SD Entered By: [name] on 02/25/2020 14:33:18 -------------------------------------------------------------------------------- Compression Therapy Details Patient Name: Date of Service: [name], [name] 02/25/2020 1:30 PM Medical Record Number: [id number] Patient Account Number: [id number] Date of Birth/Sex: Treating RN: 11-Apr-1985 (34 y.o. M) Primary Care Dodge Ator: [name] Other Clinician: Referring Lister Brizzi: Treating Tayla Panozzo/Extender: Gwyneth Revels in Treatment: 17 Compression Therapy Performed for Wound Assessment: Wound #4 Right,Lateral Foot Performed By: Michael Litter, Compression Type: Three Layer Electronic Signature(s) Signed: 02/25/2020 2:35:09 PM By: [name] EMT/HBOT/SD Entered By: [name] on 02/25/2020 14:33:18 -------------------------------------------------------------------------------- Encounter Discharge Information Details Patient Name: Date of Service: [name], [name] 02/25/2020 1:30 PM Medical Record Number: [id number] Patient Account Number: [id number] Date of Birth/Sex: Treating RN: 1986/02/16 (34 y.o. M) Primary Care Melrose Kearse: [name] Other Clinician: Referring Delani Kohli: Treating Susette Seminara/Extender: Gwyneth Revels in Treatment: 17 Encounter Discharge Information Items Discharge Condition: Stable Ambulatory Status: Ambulatory Discharge Destination: Home Transportation: Private Auto Accompanied By: self Schedule Follow-up Appointment: Yes Clinical Summary of Care: Patient Declined Electronic Signature(s) Signed: 02/25/2020 2:35:09 PM By: [name] EMT/HBOT/SD Entered By: [name] on 02/25/2020 14:34:38 -------------------------------------------------------------------------------- Wound Assessment Details Patient Name: Date of Service: [name], [name] 02/25/2020 1:30  PM Medical Record Number: [id number] Patient Account Number: [id number] Date of Birth/Sex: Treating RN: Feb 23, 1986 (34 y.o. M) Primary Care [name]: [name] Other Clinician: Referring Jaydan Chretien: Treating Fadil Macmaster/Extender: Gwyneth Revels in Treatment: 17 Wound Status Wound Number: 3 Primary Etiology: Diabetic Wound/Ulcer of the Lower Extremity Wound Location: Left, Lateral Foot Wound Status: Open Wounding Event: Trauma Date Acquired: 10/02/2019 Weeks Of Treatment: 17 Clustered Wound: No Wound Measurements Length: (cm) 2.7 Width: (cm) 2.3 Depth: (cm) 0.2 Area: (cm) 4.877 Volume: (cm) 0.975 Wound Description Classification: Grade 2 % Reduction in Area: -195.8% % Reduction in Volume: -490.9% Treatment Notes Wound #3 (Left, Lateral Foot) 1. Cleanse With Wound Cleanser Soap and water 2. Periwound Care Moisturizing lotion 3.  Primary Dressing Applied Hydrofera Blue 4. Secondary Dressing Dry Gauze 6. Support Layer Applied 3 layer compression wrap Notes moisten hydrofera blue with normal saline , felt Electronic Signature(s) Signed: 02/25/2020 2:35:09 PM By: [name] EMT/HBOT/SD Entered By: [name] on 02/25/2020 14:32:51 -------------------------------------------------------------------------------- Wound Assessment Details Patient Name: Date of Service: [name], [name] 02/25/2020 1:30 PM Medical Record Number: [id number] Patient Account Number: [id number] Date of Birth/Sex: Treating RN: 1985-11-19 (33 y.o. M) Primary Care Temitope Griffing: [name] Other Clinician: Referring [name]: Treating Shelley Pooley/Extender: Gwyneth Revels in Treatment: 17 Wound Status Wound Number: 4 Primary Etiology: Diabetic Wound/Ulcer of the Lower Extremity Wound Location: Right, Lateral Foot Wound Status: Open Wounding Event: Gradually Appeared Date Acquired: 02/06/2020 Weeks Of Treatment: 1 Clustered Wound: No Wound  Measurements Length: (cm) 0.6 Width: (cm) 1.6 Depth: (cm) 0.1 Area: (cm) 0.754 Volume: (cm) 0.075 % Reduction in Area: 18% % Reduction in Volume: 18.5% Wound Description Classification: Grade 2 Treatment Notes Wound #4 (Right, Lateral Foot) 1. Cleanse With Wound Cleanser Soap and water 2. Periwound Care Moisturizing lotion 3. Primary Dressing Applied Hydrofera Blue 4. Secondary Dressing Dry Gauze 6. Support Layer Applied 3 layer compression wrap Notes moisten hydrofera blue with normal saline , felt Electronic Signature(s) Signed: 02/25/2020 2:35:09 PM By: [name] EMT/HBOT/SD Entered By: [name] on 02/25/2020 14:32:51 -------------------------------------------------------------------------------- Vitals Details Patient Name: Date of Service: [name], [name] 02/25/2020 1:30 PM Medical Record Number: [id number] Patient Account Number: [id number] Date of Birth/Sex: Treating RN: [birth year] (34 y.o. M) Primary Care Heru Montz: [name] Other Clinician: Referring Jessie Cowher: Treating Lenvil Swaim/Extender: Gwyneth Revels in Treatment: 17 Vital Signs Time Taken: 13:35 Temperature (F): 98.2 Height (in): 77 Pulse (bpm): 111 Weight (lbs): 280 Respiratory Rate (breaths/min): 18 Body Mass Index (BMI): 33.2 Blood Pressure (mmHg): 112/67 Reference Range: 80 - 120 mg / dl Electronic Signature(s) Signed: 02/25/2020 2:35:09 PM By: [name] EMT/HBOT/SD Entered By: [name] on 02/25/2020 14:32:36

## 2020-02-26 ENCOUNTER — Other Ambulatory Visit: Payer: Self-pay

## 2020-02-26 ENCOUNTER — Encounter (HOSPITAL_BASED_OUTPATIENT_CLINIC_OR_DEPARTMENT_OTHER): Payer: BC Managed Care – PPO | Admitting: Physician Assistant

## 2020-02-26 DIAGNOSIS — E11621 Type 2 diabetes mellitus with foot ulcer: Secondary | ICD-10-CM | POA: Diagnosis not present

## 2020-02-26 NOTE — Progress Notes (Signed)
[place], [name] ([id number]) Visit Report for 02/26/2020 SuperBill Details Patient Name: Date of Service: [name] 02/26/2020 Medical Record Number: [id number] Patient Account Number: [id number] Date of Birth/Sex: Treating RN: 28-Mar-1986 (34 y.o. 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

## 2020-02-26 NOTE — Progress Notes (Signed)
[place], [name] ([id number]) Visit Report for 02/26/2020 Arrival Information Details Patient Name: Date of Service: [name] 02/26/2020 9:15 A M Medical Record Number: [id number] Patient Account Number: [id number] Date of Birth/Sex: Treating RN: September 20, 1985 (34 y.o. M) Primary Care Gerturde Kuba: [name] Other Clinician: Referring Marvelous Bouwens: Treating Latish Toutant/Extender: Prudencio Burly in Treatment: 17 Visit Information History Since Last Visit Added or deleted any medications: No Patient Arrived: Ambulatory Any new allergies or adverse reactions: No Arrival Time: 11:30 Had a fall or experienced change in No Accompanied By: self activities of daily living that may affect Transfer Assistance: None risk of falls: Patient Identification Verified: Yes Signs or symptoms of abuse/neglect since last visito No Secondary Verification Process Completed: Yes Hospitalized since last visit: No Patient Requires Transmission-Based Precautions: No Implantable device outside of the clinic excluding No Patient Has Alerts: No cellular tissue based products placed in the center since last visit: Pain Present Now: No Electronic Signature(s) Signed: 02/26/2020 3:24:25 PM By: [name] EMT/HBOT/SD Entered By: [name] on 02/26/2020 12:48:53 -------------------------------------------------------------------------------- Compression Therapy Details Patient Name: Date of Service: [name], [name] 02/26/2020 9:15 A M Medical Record Number: [id number] Patient Account Number: [id number] Date of Birth/Sex: Treating RN: 1985-07-27 (34 y.o. M) Primary Care Nateisha Moyd: [name] Other Clinician: Referring Maitlyn Penza: Treating Marvon Shillingburg/Extender: Prudencio Burly in Treatment: 17 Compression Therapy Performed for Wound Assessment: Wound #4 Right,Lateral Foot Performed By: Otho Perl, [place], Compression Type: Three Layer Electronic  Signature(s) Signed: 02/26/2020 3:24:25 PM By: [name] EMT/HBOT/SD Entered By: [name] on 02/26/2020 12:51:35 -------------------------------------------------------------------------------- Encounter Discharge Information Details Patient Name: Date of Service: [name], [name] 02/26/2020 9:15 A M Medical Record Number: [id number] Patient Account Number: [id number] Date of Birth/Sex: Treating RN: [birth year] (34 y.o. M) Primary Care Raphel Stickles: [name] Other Clinician: Referring Sharnay Cashion: Treating Jafet Wissing/Extender: Prudencio Burly in Treatment: 17 Encounter Discharge Information Items Discharge Condition: Stable Ambulatory Status: Ambulatory Discharge Destination: Home Transportation: Private Auto Accompanied By: self Schedule Follow-up Appointment: Yes Clinical Summary of Care: Patient Declined Electronic Signature(s) Signed: 02/26/2020 3:24:25 PM By: [name] EMT/HBOT/SD Entered By: [name] on 02/26/2020 12:54:13 -------------------------------------------------------------------------------- Wound Assessment Details Patient Name: Date of Service: [name], [name] 02/26/2020 9:15 A M Medical Record Number: [id number] Patient Account Number: [id number] Date of Birth/Sex: Treating RN: 08-09-1985 (34 y.o. M) Primary Care Yashira Offenberger: [name] Other Clinician: Referring Jago Carton: Treating Cheney Ewart/Extender: Prudencio Burly in Treatment: 17 Wound Status Wound Number: 3 Primary Etiology: Diabetic Wound/Ulcer of the Lower Extremity Wound Location: Left, Lateral Foot Wound Status: Open Wounding Event: Trauma Date Acquired: 10/02/2019 Weeks Of Treatment: 17 Clustered Wound: No Wound Measurements Length: (cm) 2.7 Width: (cm) 2.3 Depth: (cm) 0.2 Area: (cm) 4.877 Volume: (cm) 0.975 % Reduction in Area: -195.8% % Reduction in Volume: -490.9% Wound Description Classification: Grade  2 Electronic Signature(s) Signed: 02/26/2020 3:24:25 PM By: [name] EMT/HBOT/SD Entered By: [name] on 02/26/2020 12:49:23 -------------------------------------------------------------------------------- Wound Assessment Details Patient Name: Date of Service: [name], [name] 02/26/2020 9:15 A M Medical Record Number: [id number] Patient Account Number: [id number] Date of Birth/Sex: Treating RN: 04-Jun-1985 (34 y.o. M) Primary Care Essica Kiker: [name] Other Clinician: Referring Jethro Radke: Treating Kaylon Laroche/Extender: Prudencio Burly in Treatment: 17 Wound Status Wound Number: 4 Primary Etiology: Diabetic Wound/Ulcer of the Lower Extremity Wound Location: Right, Lateral Foot Wound Status: Open Wounding Event: Gradually Appeared Date Acquired: 02/06/2020 Weeks Of  Treatment: 1 Clustered Wound: No Wound Measurements Length: (cm) 0.6 Width: (cm) 1.6 Depth: (cm) 0.1 Area: (cm) 0.754 Volume: (cm) 0.075 % Reduction in Area: 18% % Reduction in Volume: 18.5% Wound Description Classification: Grade 2 Treatment Notes Wound #4 (Right, Lateral Foot) 1. Cleanse With Wound Cleanser Soap and water 2. Periwound Care Moisturizing lotion 3. Primary Dressing Applied Hydrofera Blue 4. Secondary Dressing Dry Gauze 6. Support Layer Applied 3 layer compression wrap Notes moisten hydrofera blue with normal saline , felt Electronic Signature(s) Signed: 02/26/2020 3:24:25 PM By: [name] EMT/HBOT/SD Entered By: [name] on 02/26/2020 12:49:24 -------------------------------------------------------------------------------- Vitals Details Patient Name: Date of Service: [name], [name] 02/26/2020 9:15 A M Medical Record Number: [id number] Patient Account Number: [id number] Date of Birth/Sex: Treating RN: 08/01/1985 (34 y.o. M) Primary Care Jaqua Ching: [name] Other Clinician: Referring Ikeem Cleckler: Treating Mohsin Crum/Extender: Prudencio Burly in Treatment: 17 Vital Signs Time Taken: 11:35 Temperature (F): 98.4 Height (in): 77 Pulse (bpm): 81 Weight (lbs): 280 Respiratory Rate (breaths/min): 15 Body Mass Index (BMI): 33.2 Blood Pressure (mmHg): 124/70 Reference Range: 80 - 120 mg / dl Electronic Signature(s) Signed: 02/26/2020 3:24:25 PM By: [name] EMT/HBOT/SD Entered By: [name] on 02/26/2020 12:49:08

## 2020-03-02 ENCOUNTER — Other Ambulatory Visit (HOSPITAL_COMMUNITY): Payer: Self-pay | Admitting: Specialist

## 2020-03-02 ENCOUNTER — Other Ambulatory Visit: Payer: Self-pay

## 2020-03-02 ENCOUNTER — Ambulatory Visit (HOSPITAL_COMMUNITY)
Admission: RE | Admit: 2020-03-02 | Discharge: 2020-03-02 | Disposition: A | Discharge status: Home / Self Care | Payer: BC Managed Care – PPO | Source: Ambulatory Visit | Attending: Cardiology | Admitting: Cardiology

## 2020-03-02 ENCOUNTER — Encounter: Payer: Self-pay | Admitting: *Deleted

## 2020-03-02 DIAGNOSIS — M7989 Other specified soft tissue disorders: Secondary | ICD-10-CM | POA: Insufficient documentation

## 2020-03-02 DIAGNOSIS — M79662 Pain in left lower leg: Secondary | ICD-10-CM | POA: Insufficient documentation

## 2020-03-02 NOTE — Progress Notes (Signed)
[place], [name] ([id number]) Visit Report for 02/25/2020 SuperBill Details Patient Name: Date of Service: [name] 02/25/2020 Medical Record Number: [id number] Patient Account Number: [id number] Date of Birth/Sex: Treating RN: [birth year] (34 y.o. 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 Electronic Signature(s) Signed: 02/25/2020 2:35:09 PM By: [name] EMT/HBOT/SD Signed: 03/02/2020 4:47:57 PM By: Baltazar Najjar MD Entered By: [name] on 02/25/2020 14:34:19

## 2020-03-02 NOTE — Progress Notes (Signed)
[place], [name] ([id number]) Visit Report for 02/20/2020 Debridement Details Patient Name: Date of Service: [name] D 02/20/2020 3:30 PM Medical Record Number: [id number] Patient Account Number: [id number] Date of Birth/Sex: Treating RN: 1985/04/19 (34 y.o. [name] Primary Care Provider: [name] Other Clinician: Referring Provider: Treating Provider/Extender: [name] in Treatment: 17 Debridement Performed for Assessment: Wound #3 Left,Lateral Foot Performed By: Physician [name], MD Debridement Type: Debridement Severity of Tissue Pre Debridement: Fat layer exposed Level of Consciousness (Pre-procedure): Awake and Alert Pre-procedure Verification/Time Out Yes - 16:29 Taken: Start Time: 16:30 Pain Control: Lidocaine 4% T opical Solution T Area Debrided (L x W): otal 3 (cm) x 2.5 (cm) = 7.5 (cm) Tissue and other material debrided: Viable, Non-Viable, Slough, Subcutaneous, Skin: Dermis , Skin: Epidermis, Fibrin/Exudate, Slough Level: Skin/Subcutaneous Tissue Debridement Description: Excisional Instrument: Curette Bleeding: Moderate Hemostasis Achieved: Silver Nitrate End Time: 16:33 Procedural Pain: 0 Post Procedural Pain: 0 Response to Treatment: Procedure was tolerated well Level of Consciousness (Post- Awake and Alert procedure): Post Debridement Measurements of Total Wound Length: (cm) 2.7 Width: (cm) 2.3 Depth: (cm) 0.2 Volume: (cm) 0.975 Character of Wound/Ulcer Post Debridement: Improved Severity of Tissue Post Debridement: Fat layer exposed Post Procedure Diagnosis Same as Pre-procedure Electronic Signature(s) Signed: 02/20/2020 5:14:03 PM By: [name] MD Signed: 02/20/2020 5:38:55 PM By: Deon Pilling Entered By: [name] on 02/20/2020 17:02:25 -------------------------------------------------------------------------------- HPI Details Patient Name: Date of Service: [name],  [name] 02/20/2020 3:30 PM Medical Record Number: [id number] Patient Account Number: [id number] Date of Birth/Sex: Treating RN: Apr 09, 1985 (34 y.o. 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 He comes in for review of a wound over the plantar fifth metatarsal head extending into the lateral part of the foot. He was followed for this previously by his podiatrist Dr. Cornelius Moras. As the patient tells his story he went to see podiatry first for a swelling he developed on the lateral part of his fifth metatarsal head in May. He states this was "open" by podiatry and the area closed. He was followed up in June and it was again opened callus removed and it closed promptly. There were plans being made for surgery on the fifth metatarsal head in June however his blood sugar was apparently too high for anesthesia. Apparently the area was debrided and opened again in June and it is never closed since. Looking over the records from podiatry I am really not able to follow this. It was clear when he was first seen it was before 5/14 at that point he already had a wound. By 5/17 the ulcer was resolved. I do not see anything about a procedure. On 5/28 noted to have pre-ulcerative moderate keratosis. X-ray noted 1/5 contracted toe and tailor's bunion and metatarsal deformity. On a visit date on 09/28/2018 the dorsal part of the left foot it healed and resolved. There was concern about swelling in his lower extremity he was sent to the ER.. As far as I can tell he was seen in the ER on 7/12 with an ulcer on his left foot. A DVT rule out of the left leg was negative. I do not think I have complete records from podiatry but I am not able to verify the  procedures this  patient states he had. He states after the last procedure the wound has never closed although I am not able to follow this in the records I have from podiatry. He has not had a recent x-ray The patient has been using Neosporin on the wound. He is wearing a Darco shoe. He is still very active up on his foot working and exercising. Past medical history; type 2 diabetes ketosis-prone, leg swelling with a negative DVT study in July. Non-smoker ABI in our clinic was 0.85 on the left 10/16; substantial wound on the plantar left fifth met head extending laterally almost to the dorsal fifth MTP. We have been using silver alginate we gave him a Darco forefoot off loader. An x-ray did not show evidence of osteomyelitis did note soft tissue emphysema which I think was due to gas tracking through an open wound. There is no doubt in my mind he requires an MRI 10/23; MRI not booked until 3 November at the earliest this is largely due to his glucose sensor in the right arm. We have been using silver alginate. There has been an improvement 10/29; I am still not exactly sure when his MRI is booked for. He says it is the third but it is the 10th in epic. This definitely needs to be done. He is running a low-grade fever today but no other symptoms. No real improvement in the 1 02/26/2019 patient presents today for a follow-up visit here in our clinic he is last been seen in the clinic on October 29. Subsequently we were working on getting MRI to evaluate and see what exactly was going on and where we would need to go from the standpoint of whether or not he had osteomyelitis and again what treatments were going be required. Subsequently the patient ended up being admitted to the hospital on 02/07/2019 and was discharged on 02/14/2019. This is a somewhat interesting admission with a discharge diagnosis of pneumonia due to COVID-19 although he was positive for COVID-19 when tested at the urgent care but  negative x2 when he was actually in the hospital. With that being said he did have acute respiratory failure with hypoxia and it was noted he also have a left foot ulceration with osteomyelitis. With that being said he did require oxygen for his pneumonia and I level 4 L. He was placed on antivirals and steroids for the COVID-19. He was also transferred to the [facility] at one point. Nonetheless he did subsequently discharged home and since being home has done much better in that regard. The CT angiogram did not show any pulmonary embolism. With regard to the osteomyelitis the patient was placed on vancomycin and Zosyn while in the hospital but has been changed to Augmentin at discharge. It was also recommended that he follow- up with wound care and podiatry. Podiatry however wanted him to see [name] according to the patient prior to them doing anything further. His hemoglobin A1c was 9.9 as noted in the hospital. Have an MRI of the left foot performed while in the hospital on 02/04/2019. 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 There is an overlying plantar open wound noted an abscess tracking back along the lateral aspect of the fifth metatarsal shaft. There is otherwise diffuse cellulitis and mild fasciitis without findings of polymyositis. The patient did have recently pneumonia secondary to COVID-19 I looked in the chart through epic and it does  appear that the patient may need to have an additional x-ray just to ensure everything is cleared and that he has no airspace disease prior to putting him into the [name]. 03/05/2019; patient was readmitted to the clinic last week. He was hospitalized twice for a viral upper respiratory tract infection from 11/1 through 11/4 and then 11/5 through 11/12 ultimately this turned out to be Covid pneumonitis. Although he was discharged on oxygen he is not using  it. He says he feels fine. He has no exercise limitation no cough no sputum. His O2 sat in our clinic today was 100% on room air. 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 He received Vanco and Zosyn in the hospital and then was discharged on 2 weeks of Augmentin. I do not see any relevant cultures. He was supposed to follow-up with infectious disease but I do not see that he has an appointment. 12/8; patient saw Dr. Novella Olive of infectious disease last week. He felt that he had had adequate antibiotic therapy. He did not go to follow-up with Dr. Amalia Hailey of podiatry and I have again talked to him about the pros and cons of this. He does not want to consider a ray amputation of this time. He is aware of the risks of recurrence, migration etc. He started HBO today and tolerated this well. He can complete the Augmentin that I gave him last week. I have looked over the lab work that Dr. Chana Bode ordered his C-reactive protein was 3.3 and his sedimentation rate was 17. The C-reactive protein is never really been measurably that high in this patient 12/15; not much change in the wound today however he has undermining along the lateral part of the foot again more extensively than last week. He has some rims of epithelialization. We have been using silver alginate. He is undergoing hyperbarics but did not dive today 12/18; in for his obligatory first total contact cast change. Unfortunately there was pus coming from the undermining area around his fifth metatarsal head. This was cultured but will preclude reapplication of a cast. He is seen in conjunction with HBO 12/24; patient had staph lugdunensis in the wound in the undermining area laterally last time. We put him on doxycycline which should have covered this. The wound looks better today. I am going to give him another week of doxycycline before  reattempting the total contact cast 12/31; the patient is completing antibiotics. Hemorrhagic debris in the distal part of the wound with some undermining distally. He also had hyper granulation. Extensive debridement with a #5 curette. The infected area that was on the lateral part of the fifth met head is closed over. I do not think he needs any more antibiotics. Patient was seen prior to HBO. Preparations for a total contact cast were made in the cast will be placed post hyperbarics 04/11/19; once again the patient arrives today without complaint. He had been in a cast all week noted that he had heavy drainage this week. This resulted in large raised areas of macerated tissue around the wound 1/14; wound bed looks better slightly smaller. Hydrofera Blue has been changing himself. He had a heavy drainage last week which caused a lot of maceration around the wound so I took him out of a total contact cast he says the drainage is actually better this week He is seen today in conjunction with HBO 1/21;  returns to clinic. He was up in [place] for a day or 2 attending a funeral. He comes back in with the wound larger and with a large area of exposed bone. He had osteomyelitis and septic arthritis of the fifth left metatarsal head while he was in hospital. He received IV antibiotics in the hospital for a prolonged period of time then 3 weeks of Augmentin. Subsequently I gave him 2 weeks of doxycycline for more superficial wound infection. When I saw this last week the wound was smaller the surface of the wound looks satisfactory. 1/28; patient missed hyperbarics today. Bone biopsy I did last time showed Enterococcus faecalis and Staphylococcus lugdunensis . He has a wide area of exposed bone. We are going to use silver alginate as of today. I had another ethical discussion with the patient. 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 In this situation I think  the likelihood of healing this is low. Therefore I have recommended a ray amputation and with the patient's agreement I have referred him to Dr. Doran Durand. The other issue is that his compliance with hyperbarics has been minimal because of his work schedule and given his underlying decision I am going to stop this today READMISSION 10/24/2019 MRI 09/29/2019 left foot IMPRESSION: 1. Apparent skin ulceration inferior and lateral to the 5th metatarsal base with underlying heterogeneous T2 signal and enhancement in the subcutaneous fat. Small peripherally enhancing fluid collections along the plantar and lateral aspects of the 5th metatarsal base suspicious for abscesses. 2. Interval amputation through the mid 5th metatarsal with nonspecific low-level marrow edema and enhancement. Given the proximity to the adjacent soft tissue inflammatory changes, osteomyelitis cannot be excluded. 3. The additional bones appear unremarkable. MRI 09/29/2019 right foot IMPRESSION: 1. Soft tissue ulceration lateral to the 5th MTP joint. There is low-level T2 hyperintensity within the 4th and 5th metatarsal heads and adjacent proximal phalanges without abnormal T1 signal or cortical destruction. These findings are nonspecific and could be seen with early marrow edema, hyperemia or early osteomyelitis. No evidence of septic joint. 2. Mild tenosynovitis and synovial enhancement associated with the extensor digitorum tendons at the level of the midfoot. 3. Diffuse low-level muscular T2 hyperintensity and enhancement, most consistent with diabetic myopathy. LEFT FOOT BONE Methicillin resistant staphylococcus aureus Staphylococcus lugdunensis MIC MIC CIPROFLOXACIN >=8 RESISTANT Resistant <=0.5 SENSI... Sensitive CLINDAMYCIN <=0.25 SENS... Sensitive >=8 RESISTANT Resistant ERYTHROMYCIN >=8 RESISTANT Resistant >=8 RESISTANT Resistant GENTAMICIN <=0.5 SENSI... Sensitive <=0.5 SENSI... Sensitive Inducible Clindamycin  NEGATIVE Sensitive NEGATIVE Sensitive OXACILLIN >=4 RESISTANT Resistant 2 SENSITIVE Sensitive RIFAMPIN <=0.5 SENSI... Sensitive <=0.5 SENSI... Sensitive TETRACYCLINE <=1 SENSITIVE Sensitive <=1 SENSITIVE Sensitive TRIMETH/SULFA <=10 SENSIT Sensitive <=10 SENSIT Sensitive ... Marland Kitchen.. VANCOMYCIN 1 SENSITIVE Sensitive <=0.5 SENSI... Sensitive Right foot bone . Component 3 wk ago Specimen Description BONE Special Requests RIGHT 4 METATARSAL SAMPLE B Gram Stain NO WBC SEEN NO ORGANISMS SEEN Culture RARE METHICILLIN RESISTANT STAPHYLOCOCCUS AUREUS NO ANAEROBES ISOLATED Performed at [facility], [place] [address], [place], [place] [address] Report Status 10/08/2019 FINAL Organism ID, Bacteria METHICILLIN RESISTANT STAPHYLOCOCCUS AUREUS Resulting Agency [facility] Susceptibility Methicillin resistant staphylococcus aureus MIC CIPROFLOXACIN >=8 RESISTANT Resistant CLINDAMYCIN <=0.25 SENS... Sensitive ERYTHROMYCIN >=8 RESISTANT Resistant GENTAMICIN <=0.5 SENSI... Sensitive Inducible Clindamycin NEGATIVE Sensitive OXACILLIN >=4 RESISTANT Resistant RIFAMPIN <=0.5 SENSI... Sensitive TETRACYCLINE <=1 SENSITIVE Sensitive TRIMETH/SULFA <=10 SENSIT Sensitive ... VANCOMYCIN 1 SENSITIVE Sensitive This is a patient we had in clinic earlier this year with a wound over his left fifth metatarsal  head. 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 Doran Durand on 05/16/2019. He seems to have developed problems with wounds on his bilateral feet in June 2021 although he may have had problems earlier than this. He was in an urgent care with a right foot ulcer on 09/26/2019 and given a course of doxycycline. This was apparently after  having trouble getting into see orthopedics. He was seen by podiatry on 09/28/2019 noted to have bilateral lower extremity ulcers including the left lateral fifth metatarsal base and the right subfifth met head. It was noted that had purulent drainage at that time. He required hospitalization from 6/20 through 7/2. This was because of worsening right foot wounds. He underwent bilateral operative incision and drainage and bone biopsies bilaterally. Culture results are listed above. He has been referred back to clinic by Dr. Jacqualyn Posey of podiatry. He is also followed by Dr. Megan Salon who saw him yesterday. He was discharged from hospital on Zyvox Flagyl and Levaquin and yesterday changed to doxycycline Flagyl and Levaquin. His inflammatory markers on 6/26 showed a sedimentation rate of 129 and a C-reactive protein of 5. This is improved to 14 and 1.3 respectively. This would indicate improvement. ABIs in our clinic today were 1.23 on the right and 1.20 on the left 11/01/2019 on evaluation today patient appears to be doing fairly well in regard to the wounds on his feet at this point. Fortunately there is no signs of active infection at this time. No fevers, chills, nausea, vomiting, or diarrhea. He currently is seeing infectious disease and still under their care at this point. Subsequently he also has both wounds which she has not been using collagen on as he did not receive that in his packaging he did not call us and let us know that. Apparently that just was missed on the order. Nonetheless we will get that straightened out today. 8/9-Patient returns for bilateral foot wounds, using Prisma with hydrogel moistened dressings, and the wounds appear stable. Patient using surgical shoes, avoiding much pressure or weightbearing as much as possible 8/16; patient has bilateral foot wounds. 1 on the right lateral foot proximally the other is on the left mid lateral foot. Both required debridement of callus  and thick skin around the wounds. We have been using silver collagen 8/27; patient has bilateral lateral foot wounds. The area on the left substantially surrounded by callus and dry skin. This was removed from the wound edge. The underlying wound is small. The area on the right measured somewhat smaller today. 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 Unfortunately I did not update his antibiotics during today's visit. 9/10 I reviewed Dr. Hale Bogus last notes he felt he had completed antibiotics his inflammatory markers were reasonably well controlled. He has a small wound on the lateral left foot and a tiny area on the right which is just above closed. He is using Hydrofera Blue with border foam he has bilateral surgical shoes 9/24; 2 week f/u. doing well. right foot is closed. left foot still undermined. 10/14; right foot remains closed at the fifth met head. The area over the base of the left fifth metatarsal has a small open area but considerable undermining towards  the plantar foot. Thick callus skin around this suggests an adequate pressure relief. We have talked about this. He says he is going to go back into his cam boot. I suggested a total contact cast he did not seem enamored with this suggestion 10/26; left foot base of the fifth metatarsal. Same condition as last time. He has skin over the area with an open wound however the skin is not adherent. He went to see Dr. Earleen Newport who did an x-ray and culture of his foot I have not reviewed the x-ray but the patient was not told anything. He is on doxycycline 11/11; since the patient was last here he was in the emergency room on 10/30 he was concerned about swelling in the left foot. They did not do any cultures or x-rays. They changed his antibiotics to cephalexin. Previous culture showed group B strep. The cephalexin is appropriate as doxycycline has less than predictable  coverage. Arrives in clinic today with swelling over this area under the wound. He also has a new wound on the right fifth metatarsal head 11/18; the patient has a difficult wound on the lateral aspect of the left fifth metatarsal head. The wound was almost ballotable last week I opened it slightly expecting to see purulence however there was just bleeding. I cultured this this was negative. X-ray unchanged. We are trying to get an MRI but I am not sure were going to be able to get this through his insurance. He also has an area on the right lateral fifth metatarsal head this looks healthier Electronic Signature(s) Signed: 02/20/2020 5:14:03 PM By: [name] MD Entered By: [name] on 02/20/2020 17:04:23 -------------------------------------------------------------------------------- Physical Exam Details Patient Name: Date of Service: [name], [name] 02/20/2020 3:30 PM Medical Record Number: [id number] Patient Account Number: [id number] Date of Birth/Sex: Treating RN: [birth year] (34 y.o. [name] Primary Care Provider: [name] Other Clinician: Referring Provider: Treating Provider/Extender: [name] in Treatment: 53 Constitutional Patient is hypertensive.. Pulse regular and within target range for patient.Marland Kitchen Respirations regular, non-labored and within target range.. Temperature is normal and within the target range for the patient.Marland Kitchen Appears in no distress. Cardiovascular Noted that the lower legs have very significant edema which is probably chronic venous insufficiency with secondary lymphedema. Notes Wound exam; left lateral fifth metatarsal head at the base of the fifth metatarsal. With a #5 curette I removed necrotic debris from the surface callus from the circumference. This cleans up a bit. Hemostasis with silver nitrate Right lateral fifth metatarsal head this is a smaller cleaner wound no debridement here. Electronic  Signature(s) Signed: 02/20/2020 5:14:03 PM By: [name] MD Entered By: [name] on 02/20/2020 17:05:31 -------------------------------------------------------------------------------- Physician Orders Details Patient Name: Date of Service: [name], [name] 02/20/2020 3:30 PM Medical Record Number: [id number] Patient Account Number: [id number] Date of Birth/Sex: Treating RN: 12/19/1985 (34 y.o. [name] Primary Care Provider: [name] Other Clinician: Referring Provider: Treating Provider/Extender: [name] in Treatment: 17 Verbal / Phone Orders: No Diagnosis Coding ICD-10 Coding Code Description E11.621 Type 2 diabetes mellitus with foot ulcer L97.528 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ity L97.518 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ity Follow-up Appointments Return Appointment in 2 weeks. Nurse Visit: - Tuesday 02/25/2020. Dressing Change Frequency Do not change entire dressing for one week. Skin Barriers/Peri-Wound Care Moisturizing lotion - in clinic both legs. Wound  Cleansing May shower with protection. - use a cast protector. Primary Wound Dressing Wound #3 Left,Lateral Foot Hydrofera Blue - classic Wound #4 Right,Lateral Foot Hydrofera Blue - classic Secondary Dressing Dry Gauze Other: - felt Edema Control 3 Layer Compression System - Bilateral - may use unna boot first layer at top of lower leg to secure dressing in place. Avoid standing for long periods of time Elevate legs to the level of the heart or above for 30 minutes daily and/or when sitting, a frequency of: - throughout the day 3-4 times. Exercise regularly Off-Loading Open toe surgical shoe to: - both feet. wound center to provide patient with one surgical shoe today. patient has one for the other foot. Radiology MRI, left lower extremity with and without  contrast - MRI left foot with and without contrast looking for infection/osteomyelitis related to non healing diabetic foot ulcer. x-ray and culture have been performed. CPT code - (ICD10 E11.621 - Type 2 diabetes mellitus with foot ulcer) Electronic Signature(s) Signed: 02/20/2020 5:14:03 PM By: [name] MD Signed: 02/20/2020 5:38:55 PM By: Deon Pilling Entered By: Deon Pilling on 02/20/2020 16:40:42 Prescription 02/20/2020 -------------------------------------------------------------------------------- [place], [name] Itay Mella MD Patient Name: Provider: 1985/07/29 [id number] Date of Birth: NPI#: Jerilynn Mages [id number] Sex: DEA #: [phone] [id number] Phone #: License #: [place] Patient Address: [address] [place], [place] [address] [place], [place] [address] [phone] Allergies metformin Provider's Orders MRI, left lower extremity with and without contrast - ICD10: E11.621 - MRI left foot with and without contrast looking for infection/osteomyelitis related to non healing diabetic foot ulcer. x-ray and culture have been performed. CPT code Hand Signature: Date(s): Electronic Signature(s) Signed: 02/20/2020 5:14:03 PM By: [name] MD Signed: 02/20/2020 5:38:55 PM By: Deon Pilling Entered By: Deon Pilling on 02/20/2020 16:40:43 -------------------------------------------------------------------------------- Problem List Details Patient Name: Date of Service: [name], [name] 02/20/2020 3:30 PM Medical Record Number: [id number] Patient Account Number: [id number] Date of Birth/Sex: Treating RN: 11/28/1985 (34 y.o. 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, right ankle and foot 10/24/2019 10/24/2019 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 He comes in for review of a wound over the plantar fifth metatarsal head extending into the lateral part of the foot. He was followed for this previously by his podiatrist  Dr. Cornelius Moras. As the patient tells his story he went to see podiatry first for a swelling he developed on the lateral part of his fifth metatarsal head in May. He states this was "open" by podiatry and the area closed. He was followed up in June and it was again opened callus removed and it closed promptly. There were plans being made for surgery on  the fifth metatarsal head in June however his blood sugar was apparently too high for anesthesia. Apparently the area was debrided and opened again in June and it is never closed since. Looking over the records from podiatry I am really not able to follow this. It was clear when he was first seen it was before 5/14 at that point he already had a wound. By 5/17 the ulcer was resolved. I do not see anything about a procedure. On 5/28 noted to have pre-ulcerative moderate keratosis. X-ray noted 1/5 contracted toe and tailor's bunion and metatarsal deformity. On a visit date on 09/28/2018 the dorsal part of the left foot it healed and resolved. There was concern about swelling in his lower extremity he was sent to the ER.. As far as I can tell he was seen in the ER on 7/12 with an ulcer on his left foot. A DVT rule out of the left leg was negative. I do not think I have complete records from podiatry but I am not able to verify the procedures this patient states he had. He states after the last procedure the wound has never closed although I am not able to follow this in the records I have from podiatry. He has not had a recent x-ray The patient has been using Neosporin on the wound. He is wearing a Darco shoe. He is still very active up on his foot working and exercising. Past medical history; type 2 diabetes ketosis-prone, leg swelling with a negative DVT study in July. Non-smoker ABI in our clinic was 0.85 on the left 10/16; substantial wound on the plantar left fifth met head extending laterally almost to the dorsal fifth MTP. We have been using silver alginate we gave him a Darco forefoot off loader. An x-ray did not show evidence of osteomyelitis did note soft tissue emphysema which I think was due to gas tracking through an open wound. There is no doubt in my mind he requires an MRI 10/23; MRI not booked until 3 November at the earliest this is largely due to his glucose sensor in the right arm. We  have been using silver alginate. There has been an improvement 10/29; I am still not exactly sure when his MRI is booked for. He says it is the third but it is the 10th in epic. This definitely needs to be done. He is running a low-grade fever today but no other symptoms. No real improvement in the 1 02/26/2019 patient presents today for a follow-up visit here in our clinic he is last been seen in the clinic on October 29. Subsequently we were working on getting MRI to evaluate and see what exactly was going on and where we would need to go from the standpoint of whether or not he had osteomyelitis and again what treatments were going be required. Subsequently the patient ended up being admitted to the hospital on 02/07/2019 and was discharged on 02/14/2019. This is a somewhat interesting admission with a discharge diagnosis of pneumonia due to COVID-19 although he was positive for COVID-19 when tested at the urgent care but negative  x2 when he was actually in the hospital. With that being said he did have acute respiratory failure with hypoxia and it was noted he also have a left foot ulceration with osteomyelitis. With that being said he did require oxygen for his pneumonia and I level 4 L. He was placed on antivirals and steroids for the COVID-19. He was also transferred to the [place] at one point. Nonetheless he did subsequently discharged home and since being home has done much better in that regard. The CT angiogram did not show any pulmonary embolism. With regard to the osteomyelitis the patient was placed on vancomycin and Zosyn while in the hospital but has been changed to Augmentin at discharge. It was also recommended that he follow- up with wound care and podiatry. Podiatry however wanted him to see [name] according to the patient prior to them doing anything further. His hemoglobin A1c was 9.9 as noted in the hospital. Have an MRI of the left foot performed while in the hospital on  02/04/2019. 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 There is an overlying plantar open wound noted an abscess tracking back along the lateral aspect of the fifth metatarsal shaft. There is otherwise diffuse cellulitis and mild fasciitis without findings of polymyositis. The patient did have recently pneumonia secondary to COVID-19 I looked in the chart through epic and it does appear that the patient may need to have an additional x-ray just to ensure everything is cleared and that he has no airspace disease prior to putting him into the [name]. 03/05/2019; patient was readmitted to the clinic last week. He was hospitalized twice for a viral upper respiratory tract infection from 11/1 through 11/4 and then 11/5 through 11/12 ultimately this turned out to be Covid pneumonitis. Although he was discharged on oxygen he is not using it. He says he feels fine. He has no exercise limitation no cough no sputum. His O2 sat in our clinic today was 100% on room air. 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 He received Vanco and Zosyn in the hospital and then was discharged on 2 weeks of Augmentin. I do not see any relevant cultures. He was supposed to follow-up with infectious disease but I do not see that he has an appointment. 12/8; patient saw Dr. Novella Olive of infectious disease last week. He felt that he had had adequate antibiotic therapy. He did not go to follow-up with Dr. Amalia Hailey of podiatry and I have again talked to him about the pros and cons of this. He does not want to consider a ray amputation of this time. He is aware of the risks of recurrence, migration etc. He started HBO today and tolerated this well. He can complete the Augmentin that I gave him last week. I have looked over the lab  work that Dr. Chana Bode ordered his C-reactive protein was 3.3 and his sedimentation rate was 17. The C-reactive protein is never really been measurably that high in this patient 12/15; not much change in the wound today however he has undermining along the lateral part of the foot again more extensively than last week. He has some rims of epithelialization. We have been using silver alginate. He is undergoing hyperbarics but did not dive today 12/18; in for his obligatory first total contact cast change.  Unfortunately there was pus coming from the undermining area around his fifth metatarsal head. This was cultured but will preclude reapplication of a cast. He is seen in conjunction with HBO 12/24; patient had staph lugdunensis in the wound in the undermining area laterally last time. We put him on doxycycline which should have covered this. The wound looks better today. I am going to give him another week of doxycycline before reattempting the total contact cast 12/31; the patient is completing antibiotics. Hemorrhagic debris in the distal part of the wound with some undermining distally. He also had hyper granulation. Extensive debridement with a #5 curette. The infected area that was on the lateral part of the fifth met head is closed over. I do not think he needs any more antibiotics. Patient was seen prior to HBO. Preparations for a total contact cast were made in the cast will be placed post hyperbarics 04/11/19; once again the patient arrives today without complaint. He had been in a cast all week noted that he had heavy drainage this week. This resulted in large raised areas of macerated tissue around the wound 1/14; wound bed looks better slightly smaller. Hydrofera Blue has been changing himself. He had a heavy drainage last week which caused a lot of maceration around the wound so I took him out of a total contact cast he says the drainage is actually better this week He is seen today in  conjunction with HBO 1/21; returns to clinic. He was up in [place] for a day or 2 attending a funeral. He comes back in with the wound larger and with a large area of exposed bone. He had osteomyelitis and septic arthritis of the fifth left metatarsal head while he was in hospital. He received IV antibiotics in the hospital for a prolonged period of time then 3 weeks of Augmentin. Subsequently I gave him 2 weeks of doxycycline for more superficial wound infection. When I saw this last week the wound was smaller the surface of the wound looks satisfactory. 1/28; patient missed hyperbarics today. Bone biopsy I did last time showed Enterococcus faecalis and Staphylococcus lugdunensis . He has a wide area of exposed bone. We are going to use silver alginate as of today. I had another ethical discussion with the patient. 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 In this situation I think the likelihood of healing this is low. Therefore I have recommended a ray amputation and with the patient's agreement I have referred him to Dr. Doran Durand. The other issue is that his compliance with hyperbarics has been minimal because of his work schedule and given his underlying decision I am going to stop this today READMISSION 10/24/2019 MRI 09/29/2019 left foot IMPRESSION: 1. Apparent skin ulceration inferior and lateral to the 5th metatarsal base with underlying heterogeneous T2 signal and enhancement in the subcutaneous fat. Small peripherally enhancing fluid collections along the plantar and lateral aspects of the 5th metatarsal base suspicious for abscesses. 2. Interval amputation through the mid 5th metatarsal with nonspecific low-level marrow edema and enhancement. Given the proximity to the adjacent soft tissue inflammatory changes, osteomyelitis cannot be excluded. 3. The additional bones appear unremarkable. MRI 09/29/2019 right foot IMPRESSION: 1. Soft tissue  ulceration lateral to the 5th MTP joint. There is low-level T2 hyperintensity within the 4th and 5th metatarsal heads and adjacent proximal phalanges without abnormal T1 signal or cortical destruction. These findings are nonspecific and could be seen with early marrow edema, hyperemia or early  osteomyelitis. No evidence of septic joint. 2. Mild tenosynovitis and synovial enhancement associated with the extensor digitorum tendons at the level of the midfoot. 3. Diffuse low-level muscular T2 hyperintensity and enhancement, most consistent with diabetic myopathy. LEFT FOOT BONE Methicillin resistant staphylococcus aureus Staphylococcus lugdunensis MIC MIC CIPROFLOXACIN >=8 RESISTANT Resistant <=0.5 SENSI... Sensitive CLINDAMYCIN <=0.25 SENS... Sensitive >=8 RESISTANT Resistant ERYTHROMYCIN >=8 RESISTANT Resistant >=8 RESISTANT Resistant GENTAMICIN <=0.5 SENSI... Sensitive <=0.5 SENSI... Sensitive Inducible Clindamycin NEGATIVE Sensitive NEGATIVE Sensitive OXACILLIN >=4 RESISTANT Resistant 2 SENSITIVE Sensitive RIFAMPIN <=0.5 SENSI... Sensitive <=0.5 SENSI... Sensitive TETRACYCLINE <=1 SENSITIVE Sensitive <=1 SENSITIVE Sensitive TRIMETH/SULFA <=10 SENSIT Sensitive <=10 SENSIT Sensitive ... Marland Kitchen.. VANCOMYCIN 1 SENSITIVE Sensitive <=0.5 SENSI... Sensitive Right foot bone . Component 3 wk ago Specimen Description BONE Special Requests RIGHT 4 METATARSAL SAMPLE B Gram Stain NO WBC SEEN NO ORGANISMS SEEN Culture RARE METHICILLIN RESISTANT STAPHYLOCOCCUS AUREUS NO ANAEROBES ISOLATED Performed at [facility], [place] [address], [place],  [address] Report Status 10/08/2019 FINAL Organism ID, Bacteria METHICILLIN RESISTANT STAPHYLOCOCCUS AUREUS Resulting Agency [facility] Susceptibility Methicillin resistant staphylococcus aureus MIC CIPROFLOXACIN >=8 RESISTANT Resistant CLINDAMYCIN <=0.25 SENS... Sensitive ERYTHROMYCIN >=8 RESISTANT Resistant GENTAMICIN <=0.5 SENSI...  Sensitive Inducible Clindamycin NEGATIVE Sensitive OXACILLIN >=4 RESISTANT Resistant RIFAMPIN <=0.5 SENSI... Sensitive TETRACYCLINE <=1 SENSITIVE Sensitive TRIMETH/SULFA <=10 SENSIT Sensitive ... VANCOMYCIN 1 SENSITIVE Sensitive This is a patient we had in clinic earlier this year with a wound over his left fifth metatarsal head. 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 Doran Durand on 05/16/2019. He seems to have developed problems with wounds on his bilateral feet in June 2021 although he may have had problems earlier than this. He was in an urgent care with a right foot ulcer on 09/26/2019 and given a course of doxycycline. This was apparently after having trouble getting into see orthopedics. He was seen by podiatry on 09/28/2019 noted to have bilateral lower extremity ulcers including the left lateral fifth metatarsal base and the right subfifth met head. It was noted that had purulent drainage at that time. He required hospitalization from 6/20 through 7/2. This was because of worsening right foot wounds. He underwent bilateral operative incision and drainage and bone biopsies bilaterally. Culture results are listed above. He has been referred back to clinic by Dr. Jacqualyn Posey of podiatry. He is also followed by Dr. Megan Salon who saw him yesterday. He was discharged from hospital on Zyvox Flagyl and Levaquin and yesterday changed to doxycycline Flagyl and Levaquin. His inflammatory markers on 6/26 showed a sedimentation rate of 129 and a C-reactive protein of 5. This is improved to 14 and 1.3 respectively. This would indicate improvement. ABIs in our clinic today were 1.23 on the right and 1.20 on the left 11/01/2019 on  evaluation today patient appears to be doing fairly well in regard to the wounds on his feet at this point. Fortunately there is no signs of active infection at this time. No fevers, chills, nausea, vomiting, or diarrhea. He currently is seeing infectious disease and still under their care at this point. Subsequently he also has both wounds which she has not been using collagen on as he did not receive that in his packaging he did not call us and let us know that. Apparently  that just was missed on the order. Nonetheless we will get that straightened out today. 8/9-Patient returns for bilateral foot wounds, using Prisma with hydrogel moistened dressings, and the wounds appear stable. Patient using surgical shoes, avoiding much pressure or weightbearing as much as possible 8/16; patient has bilateral foot wounds. 1 on the right lateral foot proximally the other is on the left mid lateral foot. Both required debridement of callus and thick skin around the wounds. We have been using silver collagen 8/27; patient has bilateral lateral foot wounds. The area on the left substantially surrounded by callus and dry skin. This was removed from the wound edge. The underlying wound is small. The area on the right measured somewhat smaller today. 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 Unfortunately I did not update his antibiotics during today's visit. 9/10 I reviewed Dr. Hale Bogus last notes he felt he had completed antibiotics his inflammatory markers were reasonably well controlled. He has a small wound on the lateral left foot and a tiny area on the right which is just above closed. He is using Hydrofera Blue with border foam he has bilateral surgical shoes 9/24; 2 week f/u. doing well. right foot is closed. left foot still undermined. 10/14; right foot remains closed at the fifth met head. The area over the base of the left fifth  metatarsal has a small open area but considerable undermining towards the plantar foot. Thick callus skin around this suggests an adequate pressure relief. We have talked about this. He says he is going to go back into his cam boot. I suggested a total contact cast he did not seem enamored with this suggestion 10/26; left foot base of the fifth metatarsal. Same condition as last time. He has skin over the area with an open wound however the skin is not adherent. He went to see Dr. Earleen Newport who did an x-ray and culture of his foot I have not reviewed the x-ray but the patient was not told anything. He is on doxycycline 11/11; since the patient was last here he was in the emergency room on 10/30 he was concerned about swelling in the left foot. They did not do any cultures or x-rays. They changed his antibiotics to cephalexin. Previous culture showed group B strep. The cephalexin is appropriate as doxycycline has less than predictable coverage. Arrives in clinic today with swelling over this area under the wound. He also has a new wound on the right fifth metatarsal head 11/18; the patient has a difficult wound on the lateral aspect of the left fifth metatarsal head. The wound was almost ballotable last week I opened it slightly expecting to see purulence however there was just bleeding. I cultured this this was negative. X-ray unchanged. We are trying to get an MRI but I am not sure were going to be able to get this through his insurance. He also has an area on the right lateral fifth metatarsal head this looks healthier Objective Constitutional Patient is hypertensive.. Pulse regular and within target range for patient.Marland Kitchen Respirations regular, non-labored and within target range.. Temperature is normal and within the target range for the patient.Marland Kitchen Appears in no distress. Vitals Time Taken: 4:10 PM, Height: 77 in, Weight: 280 lbs, BMI: 33.2, Temperature: 98.4 F, Pulse: 89 bpm, Respiratory Rate: 17  breaths/min, Blood Pressure: 155/96 mmHg. Cardiovascular Noted that the lower legs have very significant edema which is probably chronic venous insufficiency with secondary lymphedema. General  Notes: Wound exam; left lateral fifth metatarsal head at the base of the fifth metatarsal. With a #5 curette I removed necrotic debris from the surface callus from the circumference. This cleans up a bit. Hemostasis with silver nitrate ooRight lateral fifth metatarsal head this is a smaller cleaner wound no debridement here. Integumentary (Hair, Skin) Wound #3 status is Open. Original cause of wound was Trauma. The wound is located on the Left,Lateral Foot. The wound measures 2.7cm length x 2.3cm width x 0.2cm depth; 4.877cm^2 area and 0.975cm^3 volume. There is Fat Layer (Subcutaneous Tissue) exposed. There is no tunneling noted, however, there is undermining starting at 2:00 and ending at 4:00 with a maximum distance of 0.6cm. There is a medium amount of serosanguineous drainage noted. The wound margin is flat and intact. There is large (67-100%) red, pink granulation within the wound bed. There is a small (1-33%) amount of necrotic tissue within the wound bed including Adherent Slough. Wound #4 status is Open. Original cause of wound was Gradually Appeared. The wound is located on the Right,Lateral Foot. The wound measures 0.6cm length x 1.6cm width x 0.1cm depth; 0.754cm^2 area and 0.075cm^3 volume. There is Fat Layer (Subcutaneous Tissue) exposed. There is no tunneling or undermining noted. There is a medium amount of serosanguineous drainage noted. The wound margin is flat and intact. There is medium (34-66%) pink granulation within the wound bed. There is a medium (34-66%) amount of necrotic tissue within the wound bed including Adherent Slough. Assessment Active Problems ICD-10 Type 2 diabetes mellitus with foot ulcer Non-pressure chronic ulcer of other part of left foot with other specified  severity 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ity Procedures Wound #3 Pre-procedure diagnosis of Wound #3 is a Diabetic Wound/Ulcer of the Lower Extremity located on the Left,Lateral Foot .Severity of Tissue Pre Debridement is: Fat layer exposed. There was a Excisional Skin/Subcutaneous Tissue Debridement with a total area of 7.5 sq cm performed by [name], MD. With the following instrument(s): Curette to remove Viable and Non-Viable tissue/material. Material removed includes Subcutaneous Tissue, Slough, Skin: Dermis, Skin: Epidermis, and Fibrin/Exudate after achieving pain control using Lidocaine 4% T opical Solution. A time out was conducted at 16:29, prior to the start of the procedure. A Moderate amount of bleeding was controlled with Silver Nitrate. The procedure was tolerated well with a pain level of 0 throughout and a pain level of 0 following the procedure. Post Debridement Measurements: 2.7cm length x 2.3cm width x 0.2cm depth; 0.975cm^3 volume. Character of Wound/Ulcer Post Debridement is improved. Severity of Tissue Post Debridement is: Fat layer exposed. Post procedure Diagnosis Wound #3: Same as Pre-Procedure Plan Follow-up Appointments: Return Appointment in 2 weeks. Nurse Visit: - Tuesday 02/25/2020. Dressing Change Frequency: Do not change entire dressing for one week. Skin Barriers/Peri-Wound Care: Moisturizing lotion - in clinic both legs. Wound Cleansing: May shower with protection. - use a cast protector. Primary Wound Dressing: Wound #3 Left,Lateral Foot: Hydrofera Blue - classic Wound #4 Right,Lateral Foot: Hydrofera Blue - classic Secondary Dressing: Dry Gauze Other: - felt Edema Control: 3 Layer Compression System - Bilateral - may use unna boot first layer at top of lower leg to secure dressing in place. Avoid standing for long periods of time Elevate legs to the level of the heart or above for 30  minutes daily and/or when sitting, a frequency of: - throughout the day 3-4 times. Exercise regularly Off-Loading: Open toe surgical shoe to: - both feet.  wound center to provide patient with one surgical shoe today. patient has one for the other foot. Radiology ordered were: MRI, left lower extremity with and without contrast - MRI left foot with and without contrast looking for infection/osteomyelitis related to non healing diabetic foot ulcer. x-ray and culture have been performed. CPT code #1 I am going to change the dressing to [facility] to both wound areas 2. Our case manager pointed out the degree of edema in his lower legs which looks like venous insufficiency and lymphedema. I do not think that we have ever managed wounds on his legs but I decided to put him in compression to see if that will help with the wounds in his feet. We sometimes see this although I am not overly confident. The wrap also gives [name] a chance to manage the wound properly and perhaps protected them. Electronic Signature(s) Signed: 02/20/2020 5:14:03 PM By: [name] MD Entered By: [name] on 02/20/2020 17:08:58 -------------------------------------------------------------------------------- SuperBill Details Patient Name: Date of Service: [name], [name] 02/20/2020 Medical Record Number: [id number] Patient Account Number: [id number] Date of Birth/Sex: Treating RN: [birth year] (33 y.o. 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ity Facility Procedures CPT4 Code: 35391225 Description:  83462 - DEB SUBQ TISSUE 20 SQ CM/< ICD-10 Diagnosis Description L97.528 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ity Modifier: Quantity: 1 CPT4 Code: 19471252 Description: (Facility Use Only) [facility] RT LEG Modifier: 40 Quantity: 1 Physician Procedures : CPT4 Code Description Modifier [id number] - WC PHYS SUBQ TISS 20 SQ CM ICD-10 Diagnosis Description L97.528 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ity Quantity: 1 Electronic Signature(s) Signed: 02/20/2020 5:38:55 PM By: Deon Pilling Signed: 03/02/2020 4:47:57 PM By: [name] MD Previous Signature: 02/20/2020 5:14:03 PM Version By: [name] MD Entered By: Deon Pilling on 02/20/2020 17:24:46

## 2020-03-03 ENCOUNTER — Encounter (HOSPITAL_COMMUNITY): Payer: BC Managed Care – PPO

## 2020-03-05 ENCOUNTER — Other Ambulatory Visit: Payer: Self-pay

## 2020-03-05 ENCOUNTER — Ambulatory Visit (HOSPITAL_COMMUNITY)
Admission: RE | Admit: 2020-03-05 | Discharge: 2020-03-05 | Disposition: A | Discharge status: Home / Self Care | Payer: BC Managed Care – PPO | Source: Ambulatory Visit | Attending: Internal Medicine | Admitting: Internal Medicine

## 2020-03-05 DIAGNOSIS — L97529 Non-pressure chronic ulcer of other part of left foot with unspecified severity: Secondary | ICD-10-CM | POA: Insufficient documentation

## 2020-03-05 MED ORDER — GADOBUTROL 1 MMOL/ML IV SOLN
10.0000 mL | Freq: Once | INTRAVENOUS | Status: AC | PRN
  Administered 2020-03-05: 10 mL via INTRAVENOUS

## 2020-03-06 ENCOUNTER — Encounter (HOSPITAL_BASED_OUTPATIENT_CLINIC_OR_DEPARTMENT_OTHER): Payer: BC Managed Care – PPO | Attending: Internal Medicine | Admitting: Internal Medicine

## 2020-03-06 ENCOUNTER — Other Ambulatory Visit: Payer: Self-pay

## 2020-03-06 DIAGNOSIS — L97528 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ity: Secondary | ICD-10-CM | POA: Insufficient documentation

## 2020-03-06 DIAGNOSIS — E11621 Type 2 diabetes mellitus with foot ulcer: Secondary | ICD-10-CM | POA: Diagnosis present

## 2020-03-06 DIAGNOSIS — L97518 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ity: Secondary | ICD-10-CM | POA: Diagnosis not present

## 2020-03-06 NOTE — Progress Notes (Signed)
[place], [name] ([id number]) Visit Report for 03/06/2020 HPI Details Patient Name: Date of Service: [name] 03/06/2020 3:45 PM Medical Record Number: [id number] Patient Account Number: [id number] Date of Birth/Sex: Treating RN: 08-06-1985 (34 y.o. 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 He comes in for review of a wound over the plantar fifth metatarsal head extending into the lateral part of the foot. He was followed for this previously by his podiatrist Dr. Cornelius Moras. As the patient tells his story he went to see podiatry first for a swelling he developed on the lateral part of his fifth metatarsal head in May. He states this was "open" by podiatry and the area closed. He was followed up in June and it was again opened callus removed and it closed promptly. There were plans being made for surgery on the fifth metatarsal head in June however his blood sugar was apparently too high for anesthesia. Apparently the area was debrided and opened again in June and it is never closed since. Looking over the records from podiatry I am really not able to follow this. It was clear when he was first seen it was before 5/14 at that point he already had a wound. By 5/17 the ulcer was resolved. I do not see anything about a procedure. On 5/28 noted to have pre-ulcerative moderate keratosis. X-ray noted 1/5 contracted toe and tailor's bunion and metatarsal deformity. On a visit date on 09/28/2018 the dorsal part of the left foot it healed and resolved. There was concern about swelling in his lower extremity he was sent to the ER.. As far as I can tell he was seen in the ER on 7/12 with an ulcer on his left foot. A DVT rule  out of the left leg was negative. I do not think I have complete records from podiatry but I am not able to verify the procedures this patient states he had. He states after the last procedure the wound has never closed although I am not able to follow this in the records I have from podiatry. He has not had a recent x-ray The patient has been using Neosporin on the wound. He is wearing a Darco shoe. He is still very active up on his foot working and exercising. Past medical history; type 2 diabetes ketosis-prone, leg swelling with a negative DVT study in July. Non-smoker ABI in our clinic was 0.85 on the left 10/16; substantial wound on the plantar left fifth met head extending laterally almost to the dorsal fifth MTP. We have been using silver alginate we gave him a Darco forefoot off loader. An x-ray did not show evidence of osteomyelitis did note soft tissue emphysema which I think was due to gas tracking through an open wound. There is no doubt in my mind he requires an MRI 10/23; MRI not booked until 3 November at the earliest this is largely due to his glucose sensor in the right arm. We have been using silver alginate. There has been an improvement 10/29; I am still not exactly sure when his MRI is booked for. He says it is the third but it is the 10th in epic. This definitely needs to be done. He is running a low-grade fever today but no other symptoms. No real improvement  in the 1 02/26/2019 patient presents today for a follow-up visit here in our clinic he is last been seen in the clinic on October 29. Subsequently we were working on getting MRI to evaluate and see what exactly was going on and where we would need to go from the standpoint of whether or not he had osteomyelitis and again what treatments were going be required. Subsequently the patient ended up being admitted to the hospital on 02/07/2019 and was discharged on 02/14/2019. This is a somewhat interesting admission with a  discharge diagnosis of pneumonia due to COVID-19 although he was positive for COVID-19 when tested at the urgent care but negative x2 when he was actually in the hospital. With that being said he did have acute respiratory failure with hypoxia and it was noted he also have a left foot ulceration with osteomyelitis. With that being said he did require oxygen for his pneumonia and I level 4 L. He was placed on antivirals and steroids for the COVID-19. He was also transferred to the [facility] at one point. Nonetheless he did subsequently discharged home and since being home has done much better in that regard. The CT angiogram did not show any pulmonary embolism. With regard to the osteomyelitis the patient was placed on vancomycin and Zosyn while in the hospital but has been changed to Augmentin at discharge. It was also recommended that he follow- up with wound care and podiatry. Podiatry however wanted him to see [name] according to the patient prior to them doing anything further. His hemoglobin A1c was 9.9 as noted in the hospital. Have an MRI of the left foot performed while in the hospital on 02/04/2019. 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 There is an overlying plantar open wound noted an abscess tracking back along the lateral aspect of the fifth metatarsal shaft. There is otherwise diffuse cellulitis and mild fasciitis without findings of polymyositis. The patient did have recently pneumonia secondary to COVID-19 I looked in the chart through epic and it does appear that the patient may need to have an additional x-ray just to ensure everything is cleared and that he has no airspace disease prior to putting him into the chamber. 03/05/2019; patient was readmitted to the clinic last week. He was hospitalized twice for a viral upper respiratory tract infection from 11/1 through 11/4 and  then 11/5 through 11/12 ultimately this turned out to be Covid pneumonitis. Although he was discharged on oxygen he is not using it. He says he feels fine. He has no exercise limitation no cough no sputum. His O2 sat in our clinic today was 100% on room air. 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 He received Vanco and Zosyn in the hospital and then was discharged on 2 weeks of Augmentin. I do not see any relevant cultures. He was supposed to follow-up with infectious disease but I do not see that he has an appointment. 12/8; patient saw Dr. Novella Olive of infectious disease last week. He felt that he had had adequate antibiotic therapy. He did not go to follow-up with Dr. Amalia Hailey of podiatry and I have again talked to him about the pros and cons of this. He does not want to consider a ray amputation of this time. He is aware of the risks of  recurrence, migration etc. He started HBO today and tolerated this well. He can complete the Augmentin that I gave him last week. I have looked over the lab work that Dr. Chana Bode ordered his C-reactive protein was 3.3 and his sedimentation rate was 17. The C-reactive protein is never really been measurably that high in this patient 12/15; not much change in the wound today however he has undermining along the lateral part of the foot again more extensively than last week. He has some rims of epithelialization. We have been using silver alginate. He is undergoing hyperbarics but did not dive today 12/18; in for his obligatory first total contact cast change. Unfortunately there was pus coming from the undermining area around his fifth metatarsal head. This was cultured but will preclude reapplication of a cast. He is seen in conjunction with HBO 12/24; patient had staph lugdunensis in the wound in the undermining area laterally last time. We put him on  doxycycline which should have covered this. The wound looks better today. I am going to give him another week of doxycycline before reattempting the total contact cast 12/31; the patient is completing antibiotics. Hemorrhagic debris in the distal part of the wound with some undermining distally. He also had hyper granulation. Extensive debridement with a #5 curette. The infected area that was on the lateral part of the fifth met head is closed over. I do not think he needs any more antibiotics. Patient was seen prior to HBO. Preparations for a total contact cast were made in the cast will be placed post hyperbarics 04/11/19; once again the patient arrives today without complaint. He had been in a cast all week noted that he had heavy drainage this week. This resulted in large raised areas of macerated tissue around the wound 1/14; wound bed looks better slightly smaller. Hydrofera Blue has been changing himself. He had a heavy drainage last week which caused a lot of maceration around the wound so I took him out of a total contact cast he says the drainage is actually better this week He is seen today in conjunction with HBO 1/21; returns to clinic. He was up in [place] for a day or 2 attending a funeral. He comes back in with the wound larger and with a large area of exposed bone. He had osteomyelitis and septic arthritis of the fifth left metatarsal head while he was in hospital. He received IV antibiotics in the hospital for a prolonged period of time then 3 weeks of Augmentin. Subsequently I gave him 2 weeks of doxycycline for more superficial wound infection. When I saw this last week the wound was smaller the surface of the wound looks satisfactory. 1/28; patient missed hyperbarics today. Bone biopsy I did last time showed Enterococcus faecalis and Staphylococcus lugdunensis . He has a wide area of exposed bone. We are going to use silver alginate as of today. I had another ethical discussion  with the patient. 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 In this situation I think the likelihood of healing this is low. Therefore I have recommended a ray amputation and with the patient's agreement I have referred him to Dr. Doran Durand. The other issue is that his compliance with hyperbarics has been minimal because of his work schedule and given his underlying decision I am going to stop this today READMISSION 10/24/2019 MRI 09/29/2019 left foot IMPRESSION: 1. Apparent skin ulceration inferior and lateral to the 5th metatarsal base with underlying heterogeneous T2 signal  and enhancement in the subcutaneous fat. Small peripherally enhancing fluid collections along the plantar and lateral aspects of the 5th metatarsal base suspicious for abscesses. 2. Interval amputation through the mid 5th metatarsal with nonspecific low-level marrow edema and enhancement. Given the proximity to the adjacent soft tissue inflammatory changes, osteomyelitis cannot be excluded. 3. The additional bones appear unremarkable. MRI 09/29/2019 right foot IMPRESSION: 1. Soft tissue ulceration lateral to the 5th MTP joint. There is low-level T2 hyperintensity within the 4th and 5th metatarsal heads and adjacent proximal phalanges without abnormal T1 signal or cortical destruction. These findings are nonspecific and could be seen with early marrow edema, hyperemia or early osteomyelitis. No evidence of septic joint. 2. Mild tenosynovitis and synovial enhancement associated with the extensor digitorum tendons at the level of the midfoot. 3. Diffuse low-level muscular T2 hyperintensity and enhancement, most consistent with diabetic myopathy. LEFT FOOT BONE Methicillin resistant staphylococcus aureus Staphylococcus lugdunensis MIC MIC CIPROFLOXACIN >=8 RESISTANT Resistant <=0.5 SENSI... Sensitive CLINDAMYCIN <=0.25 SENS... Sensitive >=8 RESISTANT Resistant ERYTHROMYCIN >=8  RESISTANT Resistant >=8 RESISTANT Resistant GENTAMICIN <=0.5 SENSI... Sensitive <=0.5 SENSI... Sensitive Inducible Clindamycin NEGATIVE Sensitive NEGATIVE Sensitive OXACILLIN >=4 RESISTANT Resistant 2 SENSITIVE Sensitive RIFAMPIN <=0.5 SENSI... Sensitive <=0.5 SENSI... Sensitive TETRACYCLINE <=1 SENSITIVE Sensitive <=1 SENSITIVE Sensitive TRIMETH/SULFA <=10 SENSIT Sensitive <=10 SENSIT Sensitive ... Marland Kitchen.. VANCOMYCIN 1 SENSITIVE Sensitive <=0.5 SENSI... Sensitive Right foot bone . Component 3 wk ago Specimen Description BONE Special Requests RIGHT 4 METATARSAL SAMPLE B Gram Stain NO WBC SEEN NO ORGANISMS SEEN Culture RARE METHICILLIN RESISTANT STAPHYLOCOCCUS AUREUS NO ANAEROBES ISOLATED Performed at [facility], [place] [address], [place], [place] [address] Report Status 10/08/2019 FINAL Organism ID, Bacteria METHICILLIN RESISTANT STAPHYLOCOCCUS AUREUS Resulting Agency [facility] Susceptibility Methicillin resistant staphylococcus aureus MIC CIPROFLOXACIN >=8 RESISTANT Resistant CLINDAMYCIN <=0.25 SENS... Sensitive ERYTHROMYCIN >=8 RESISTANT Resistant GENTAMICIN <=0.5 SENSI... Sensitive Inducible Clindamycin NEGATIVE Sensitive OXACILLIN >=4 RESISTANT Resistant RIFAMPIN <=0.5 SENSI... Sensitive TETRACYCLINE <=1 SENSITIVE Sensitive TRIMETH/SULFA <=10 SENSIT Sensitive ... VANCOMYCIN 1 SENSITIVE Sensitive This is a patient we had in clinic earlier this year with a wound over his left fifth metatarsal head. 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 Doran Durand on 05/16/2019. He seems to have developed problems with wounds on his bilateral feet in June 2021 although he may have had problems earlier than  this. He was in an urgent care with a right foot ulcer on 09/26/2019 and given a course of doxycycline. This was apparently after having trouble getting into see orthopedics. He was seen by podiatry on 09/28/2019 noted to have bilateral lower extremity ulcers including the left lateral fifth metatarsal base and the right subfifth met head. It was noted that had purulent drainage at that time. He required hospitalization from 6/20 through 7/2. This was because of worsening right foot wounds. He underwent bilateral operative incision and drainage and bone biopsies bilaterally. Culture results are listed above. He has been referred back to clinic by Dr. Jacqualyn Posey of podiatry. He is also followed by Dr. Megan Salon who saw him yesterday. He was discharged from hospital on Zyvox Flagyl and Levaquin and yesterday changed to doxycycline Flagyl and Levaquin. His inflammatory markers on 6/26 showed a sedimentation rate of 129 and a C-reactive protein of 5. This is  improved to 14 and 1.3 respectively. This would indicate improvement. ABIs in our clinic today were 1.23 on the right and 1.20 on the left 11/01/2019 on evaluation today patient appears to be doing fairly well in regard to the wounds on his feet at this point. Fortunately there is no signs of active infection at this time. No fevers, chills, nausea, vomiting, or diarrhea. He currently is seeing infectious disease and still under their care at this point. Subsequently he also has both wounds which she has not been using collagen on as he did not receive that in his packaging he did not call us and let us know that. Apparently that just was missed on the order. Nonetheless we will get that straightened out today. 8/9-Patient returns for bilateral foot wounds, using Prisma with hydrogel moistened dressings, and the wounds appear stable. Patient using surgical shoes, avoiding much pressure or weightbearing as much as possible 8/16; patient has bilateral foot  wounds. 1 on the right lateral foot proximally the other is on the left mid lateral foot. Both required debridement of callus and thick skin around the wounds. We have been using silver collagen 8/27; patient has bilateral lateral foot wounds. The area on the left substantially surrounded by callus and dry skin. This was removed from the wound edge. The underlying wound is small. The area on the right measured somewhat smaller today. 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 Unfortunately I did not update his antibiotics during today's visit. 9/10 I reviewed Dr. Hale Bogus last notes he felt he had completed antibiotics his inflammatory markers were reasonably well controlled. He has a small wound on the lateral left foot and a tiny area on the right which is just above closed. He is using Hydrofera Blue with border foam he has bilateral surgical shoes 9/24; 2 week f/u. doing well. right foot is closed. left foot still undermined. 10/14; right foot remains closed at the fifth met head. The area over the base of the left fifth metatarsal has a small open area but considerable undermining towards the plantar foot. Thick callus skin around this suggests an adequate pressure relief. We have talked about this. He says he is going to go back into his cam boot. I suggested a total contact cast he did not seem enamored with this suggestion 10/26; left foot base of the fifth metatarsal. Same condition as last time. He has skin over the area with an open wound however the skin is not adherent. He went to see Dr. Earleen Newport who did an x-ray and culture of his foot I have not reviewed the x-ray but the patient was not told anything. He is on doxycycline 11/11; since the patient was last here he was in the emergency room on 10/30 he was concerned about swelling in the left foot. They did not do any cultures or x-rays. They changed his antibiotics to  cephalexin. Previous culture showed group B strep. The cephalexin is appropriate as doxycycline has less than predictable coverage. Arrives in clinic today with swelling over this area under the wound. He also has a new wound on the right fifth metatarsal head 11/18; the patient has a difficult wound on the lateral aspect of the left fifth metatarsal head. The wound was almost ballotable last week I opened it slightly expecting to see purulence however there was just bleeding. I cultured this this was negative. X-ray unchanged. We are trying to get an MRI  but I am not sure were going to be able to get this through his insurance. He also has an area on the right lateral fifth metatarsal head this looks healthier 12/3; the patient finally got our MRI. Surprisingly this did not show osteomyelitis. I did show the soft tissue ulceration at the lateral plantar aspect of the fifth metatarsal base with a tiny residual 6 mm abscess overlying the superficial fascia I have tried to culture this area I have not been able to get this to grow anything. Nevertheless the protruding tissue looks aggravated. I suspect we should try to treat the underlying "abscess with broad-spectrum antibiotics. I am going to start him on Levaquin and Flagyl. He has much less edema in his legs and I am going to continue to wrap his legs and see him weekly Electronic Signature(s) Signed: 03/06/2020 5:12:12 PM By: Baltazar Najjar MD Entered By: Baltazar Najjar on 03/06/2020 16:59:34 -------------------------------------------------------------------------------- Physical Exam Details Patient Name: Date of Service: [name], [name] 03/06/2020 3:45 PM Medical Record Number: [id number] Patient Account Number: [id number] Date of Birth/Sex: Treating RN: [birth year] (34 y.o. [name] Primary Care Provider: [name] Other Clinician: Referring Provider: Treating Provider/Extender: Gwyneth Revels in  Treatment: 19 Constitutional Sitting or standing Blood Pressure is within target range for patient.. Pulse regular and within target range for patient.Marland Kitchen Respirations regular, non-labored and within target range.. Temperature is normal and within the target range for the patient.Marland Kitchen Appears in no distress. Cardiovascular Pedal pulses are palpable. Much better edema control in the left leg. Notes Wound exam; left lateral fifth metatarsal head at the base of the fifth metatarsal. Again protruding granulation tissue this is almost ballotable. I do not think this is a normal situation. Nor do I think this is going to heal like this with or without a total contact cast. There is no surrounding erythema no gross purulence. He has a small area on the right lateral fifth metatarsal head this actually looks better. Electronic Signature(s) Signed: 03/06/2020 5:12:12 PM By: Baltazar Najjar MD Entered By: Baltazar Najjar on 03/06/2020 17:03:42 -------------------------------------------------------------------------------- Physician Orders Details Patient Name: Date of Service: [name], [name] 03/06/2020 3:45 PM Medical Record Number: [id number] Patient Account Number: [id number] Date of Birth/Sex: Treating RN: Feb 24, 1986 (34 y.o. [name] Primary Care Provider: [name] Other Clinician: Referring Provider: Treating Provider/Extender: Gwyneth Revels in Treatment: 19 Verbal / Phone Orders: No Diagnosis Coding ICD-10 Coding Code Description E11.621 Type 2 diabetes mellitus with foot ulcer L97.528 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ity L97.518 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ity Follow-up Appointments Return Appointment in 1 week. Bathing/ Shower/ Hygiene May shower with protection but do not get wound dressing(s) wet. - may use cast protectors Edema Control -  Lymphedema / SCD / Other Bilateral Lower Extremities Elevate legs to the level of the heart or above for 30 minutes daily and/or when sitting, a frequency of: Avoid standing for long periods of time. Exercise regularly Off-Loading Open toe surgical shoe to: - both feet Wound Treatment Wound #3 - Foot Wound Laterality: Left, Lateral Peri-Wound Care: Sween Lotion (Moisturizing lotion) Discharge Instructions: Apply moisturizing lotion as directed Prim Dressing: Hydrofera Blue Classic Foam, 2x2 in ary Discharge Instructions: Apply to wound bed as instructed Secondary Dressing: Woven Gauze Sponge, Non-Sterile 4x4 in Discharge Instructions: Apply over primary dressing as directed. Secondary Dressing: ABD Pad, 5x9 Discharge  Instructions: Apply over primary dressing as directed. Secondary Dressing: Felt 2.5 yds x 5.5 in Discharge Instructions: Apply over primary dressing as callous pad Compression Wrap: ThreePress (3 layer compression wrap) Discharge Instructions: Apply three layer compression, may use unna layer at top to secure wrap Wound #4 - Foot Wound Laterality: Right, Lateral Peri-Wound Care: Sween Lotion (Moisturizing lotion) Discharge Instructions: Apply moisturizing lotion as directed Prim Dressing: Hydrofera Blue Classic Foam, 2x2 in ary Discharge Instructions: Apply to wound bed as instructed Secondary Dressing: Woven Gauze Sponges 2x2 in Discharge Instructions: Apply over primary dressing as directed. Secondary Dressing: Felt 2.5 yds x 5.5 in Discharge Instructions: Apply over primary dressing as callous pad Compression Wrap: ThreePress (3 layer compression wrap) Discharge Instructions: Apply three layer compression, may use unna layer at top to secure wrap Patient Medications llergies: metformin A Notifications Medication Indication Start End diabetic foot infection 03/06/2020 Levaquin DOSE oral 500 mg tablet - 1 tablet oral qd for 2 weeks diabetic foot infection  03/06/2020 metronidazole DOSE oral 500 mg tablet - 1 tablet oral tid for 2 weeks Electronic Signature(s) Signed: 03/06/2020 5:06:56 PM By: [name] MD Previous Signature: 03/06/2020 5:01:53 PM Version By: Baruch Gouty RN, BSN Entered By: [name] on 03/06/2020 17:06:55 -------------------------------------------------------------------------------- Problem List Details Patient Name: Date of Service: [name], [name] 03/06/2020 3:45 PM Medical Record Number: [id number] Patient Account Number: [id number] Date of Birth/Sex: Treating RN: Apr 04, 1986 (34 y.o. 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, right ankle and foot 10/24/2019 10/24/2019 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 Modifier: Quantity: 1 Electronic Signature(s) Signed: 03/06/2020 5:01:53 PM By: Baruch Gouty RN, BSN Signed: 03/06/2020 5:12:12 PM By: [name] MD Entered By: Baruch Gouty on 03/06/2020 16:36:16

## 2020-03-09 NOTE — Progress Notes (Signed)
[place], [name] ([id number]) Visit Report for 03/06/2020 Arrival Information Details Patient Name: Date of Service: [name] 03/06/2020 3:45 PM Medical Record Number: [id number] Patient Account Number: [id number] Date of Birth/Sex: Treating RN: 12/05/1985 (34 y.o. [name], [name] Primary Care Lisabeth Mian: [name] Other Clinician: Referring Giulia Hickey: Treating Keylin Ferryman/Extender: Gwyneth Revels in Treatment: 19 Visit Information History Since Last Visit Added or deleted any medications: No Patient Arrived: Ambulatory Any new allergies or adverse reactions: No Arrival Time: 15:58 Had a fall or experienced change in No Accompanied By: self activities of daily living that may affect Transfer Assistance: None risk of falls: Patient Identification Verified: Yes Signs or symptoms of abuse/neglect since last visito No Secondary Verification Process Completed: Yes Hospitalized since last visit: No Patient Requires Transmission-Based Precautions: No Implantable device outside of the clinic excluding No Patient Has Alerts: No cellular tissue based products placed in the center since last visit: Has Dressing in Place as Prescribed: Yes Has Compression in Place as Prescribed: No Pain Present Now: Yes Electronic Signature(s) Signed: 03/09/2020 5:19:12 PM By: Fonnie Mu RN Entered By: Fonnie Mu on 03/06/2020 15:59:43 -------------------------------------------------------------------------------- Compression Therapy Details Patient Name: Date of Service: [name], [name] 03/06/2020 3:45 PM Medical Record Number: [id number] Patient Account Number: [id number] Date of Birth/Sex: Treating RN: 26-Mar-1986 (34 y.o. [name] Primary Care Carlos Quackenbush: [name] Other Clinician: Referring Yehya Brendle: Treating Estel Scholze/Extender: Gwyneth Revels in Treatment: 19 Compression Therapy Performed for Wound Assessment:  Wound #3 Left,Lateral Foot Performed By: Clinician Shawn Stall, RN Compression Type: Three Layer Post Procedure Diagnosis Same as Pre-procedure Electronic Signature(s) Signed: 03/06/2020 5:01:53 PM By: Zenaida Deed RN, BSN Entered By: Zenaida Deed on 03/06/2020 16:31:09 -------------------------------------------------------------------------------- Compression Therapy Details Patient Name: Date of Service: [name], [name] 03/06/2020 3:45 PM Medical Record Number: [id number] Patient Account Number: [id number] Date of Birth/Sex: Treating RN: Jul 30, 1985 (34 y.o. [name] Primary Care Joycelyn Liska: [name] Other Clinician: Referring Shakerria Parran: Treating Zoya Sprecher/Extender: Gwyneth Revels in Treatment: 19 Compression Therapy Performed for Wound Assessment: Wound #4 Right,Lateral Foot Performed By: Clinician Shawn Stall, RN Compression Type: Three Layer Post Procedure Diagnosis Same as Pre-procedure Electronic Signature(s) Signed: 03/06/2020 5:01:53 PM By: Zenaida Deed RN, BSN Entered By: Zenaida Deed on 03/06/2020 16:31:10 -------------------------------------------------------------------------------- Encounter Discharge Information Details Patient Name: Date of Service: [name], [name] 03/06/2020 3:45 PM Medical Record Number: [id number] Patient Account Number: [id number] Date of Birth/Sex: Treating RN: [birth year] (34 y.o. [name], [name] Primary Care Juanangel Soderholm: [name] Other Clinician: Referring Neida Ellegood: Treating Sharonda Llamas/Extender: Gwyneth Revels in Treatment: 19 Encounter Discharge Information Items Discharge Condition: Stable Ambulatory Status: Ambulatory Discharge Destination: Home Transportation: Private Auto Accompanied By: self Schedule Follow-up Appointment: Yes Clinical Summary of Care: Patient Declined Electronic Signature(s) Signed: 03/06/2020 4:42:36 PM By: Fonnie Mu  RN Entered By: Fonnie Mu on 03/06/2020 16:42:36 -------------------------------------------------------------------------------- Lower Extremity Assessment Details Patient Name: Date of Service: [name] D 03/06/2020 3:45 PM Medical Record Number: [id number] Patient Account Number: [id number] Date of Birth/Sex: Treating RN: 1986/01/07 (34 y.o. [name], [name] Primary Care Seda Kronberg: [name] Other Clinician: Referring Tabbetha Kutscher: Treating Sidda Humm/Extender: Gwyneth Revels in Treatment: 19 Edema Assessment Assessed: [name]: Yes] Franne Forts: Yes] Edema: [Left: No] [Right: No] Calf Left: Right: Point of Measurement: 48 cm From Medial Instep 54 cm 49.5 cm Ankle Left: Right: Point of Measurement: 10 cm From Medial Instep 30 cm 27.8 cm Knee To Floor Left:  Right: From Medial Instep 51 cm Vascular Assessment Pulses: Dorsalis Pedis Palpable: [Left:Yes] [Right:Yes] Posterior Tibial Palpable: [Left:Yes] [Right:Yes] Electronic Signature(s) Signed: 03/09/2020 5:19:12 PM By: Fonnie Mu RN Entered By: Fonnie Mu on 03/06/2020 16:03:36 -------------------------------------------------------------------------------- Multi Wound Chart Details Patient Name: Date of Service: [name], [name] 03/06/2020 3:45 PM Medical Record Number: [id number] Patient Account Number: [id number] Date of Birth/Sex: Treating RN: [birth year] (34 y.o. [name] Primary Care Peytin Dechert: [name] Other Clinician: Referring Chieko Neises: Treating Anai Lipson/Extender: Gwyneth Revels in Treatment: 19 Vital Signs Height(in): 77 Capillary Blood Glucose(mg/dl): 854 Weight(lbs): 627 Pulse(bpm): 83 Body Mass Index(BMI): 33 Blood Pressure(mmHg): 127/83 Temperature(F): 98.4 Respiratory Rate(breaths/min): 18 Photos: [3:No Photos Left, Lateral Foot] [4:No Photos Right, Lateral Foot] [N/A:N/A N/A] Wound Location: [3:Trauma]  [4:Gradually Appeared] [N/A:N/A] Wounding Event: [3:Diabetic Wound/Ulcer of the Lower] [4:Diabetic Wound/Ulcer of the Lower] [N/A:N/A] Primary Etiology: [3:Extremity Type II Diabetes] [4:Extremity Type II Diabetes] [N/A:N/A] Comorbid History: [3:10/02/2019] [4:02/06/2020] [N/A:N/A] Date Acquired: [3:19] [4:3] [N/A:N/A] Weeks of Treatment: [3:Open] [4:Open] [N/A:N/A] Wound Status: [3:2.8x3x0.5] [4:0.3x0.5x0.1] [N/A:N/A] Measurements L x W x D (cm) [3:6.597] [4:0.118] [N/A:N/A] A (cm) : rea [3:3.299] [4:0.012] [N/A:N/A] Volume (cm) : [3:-300.10%] [4:87.20%] [N/A:N/A] % Reduction in A rea: [3:-1899.40%] [4:87.00%] [N/A:N/A] % Reduction in Volume: [3:12] Starting Position 1 (o'clock): [3:12] Ending Position 1 (o'clock): [3:0.8] Maximum Distance 1 (cm): [3:Yes] [4:No] [N/A:N/A] Undermining: [3:Grade 2] [4:Grade 2] [N/A:N/A] Classification: [3:Medium] [4:Medium] [N/A:N/A] Exudate A mount: [3:Purulent] [4:Serosanguineous] [N/A:N/A] Exudate Type: [3:yellow, brown, green] [4:red, brown] [N/A:N/A] Exudate Color: [3:Distinct, outline attached] [4:Distinct, outline attached] [N/A:N/A] Wound Margin: [3:Medium (34-66%)] [4:Large (67-100%)] [N/A:N/A] Granulation Amount: [3:Red, Pink, Hyper-granulation] [4:Red, Pink] [N/A:N/A] Granulation Quality: [3:Medium (34-66%)] [4:None Present (0%)] [N/A:N/A] Necrotic Amount: [3:Fascia: Yes] [4:Fascia: No] [N/A:N/A] Exposed Structures: [3:Fat Layer (Subcutaneous Tissue): No Tendon: No Muscle: No Joint: No Bone: No Small (1-33%)] [4:Fat Layer (Subcutaneous Tissue): No Tendon: No Muscle: No Joint: No Bone: No Medium (34-66%)] [N/A:N/A] Epithelialization: [3:Compression Therapy] [4:Compression Therapy] [N/A:N/A] Treatment Notes Wound #3 (Foot) Wound Laterality: Left, Lateral Cleanser Peri-Wound Care Sween Lotion (Moisturizing lotion) Discharge Instruction: Apply moisturizing lotion as directed Topical Primary Dressing Hydrofera Blue Classic Foam, 2x2  in Discharge Instruction: Apply to wound bed as instructed Secondary Dressing Woven Gauze Sponge, Non-Sterile 4x4 in Discharge Instruction: Apply over primary dressing as directed. ABD Pad, 5x9 Discharge Instruction: Apply over primary dressing as directed. Felt 2.5 yds x 5.5 in Discharge Instruction: Apply over primary dressing as callous pad Secured With Compression Wrap ThreePress (3 layer compression wrap) Discharge Instruction: Apply three layer compression, may use unna layer at top to secure wrap Compression Stockings Add-Ons Wound #4 (Foot) Wound Laterality: Right, Lateral Cleanser Peri-Wound Care Sween Lotion (Moisturizing lotion) Discharge Instruction: Apply moisturizing lotion as directed Topical Primary Dressing Hydrofera Blue Classic Foam, 2x2 in Discharge Instruction: Apply to wound bed as instructed Secondary Dressing Woven Gauze Sponges 2x2 in Discharge Instruction: Apply over primary dressing as directed. Felt 2.5 yds x 5.5 in Discharge Instruction: Apply over primary dressing as callous pad Secured With Compression Wrap ThreePress (3 layer compression wrap) Discharge Instruction: Apply three layer compression, may use unna layer at top to secure wrap Compression Stockings Add-Ons Electronic Signature(s) Signed: 03/06/2020 5:01:53 PM By: Zenaida Deed RN, BSN Signed: 03/06/2020 5:12:12 PM By: Baltazar Najjar MD Entered By: Baltazar Najjar on 03/06/2020 16:58:06 -------------------------------------------------------------------------------- Multi-Disciplinary Care Plan Details Patient Name: Date of Service: [name], [name] 03/06/2020 3:45 PM Medical Record Number: [id number] Patient Account Number: [id number] Date of Birth/Sex: Treating RN: 11-28-1985 (34  y.o. [name] Primary Care Joreen Swearingin: [name] Other Clinician: Referring Skyler Dusing: Treating Alvera Tourigny/Extender: Gwyneth Revels in Treatment: 19 Active  Inactive Nutrition Nursing Diagnoses: Imbalanced nutrition Potential for alteratiion in Nutrition/Potential for imbalanced nutrition Goals: Patient/caregiver agrees to and verbalizes understanding of need to use nutritional supplements and/or vitamins as prescribed Date Initiated: 10/24/2019 Target Resolution Date: 03/06/2020 Goal Status: Active Patient/caregiver will maintain therapeutic glucose control Date Initiated: 10/24/2019 Target Resolution Date: 03/06/2020 Goal Status: Active Interventions: Assess HgA1c results as ordered upon admission and as needed Assess patient nutrition upon admission and as needed per policy Provide education on elevated blood sugars and impact on wound healing Provide education on nutrition Treatment Activities: Education provided on Nutrition : 11/01/2019 Notes: Wound/Skin Impairment Nursing Diagnoses: Impaired tissue integrity Knowledge deficit related to ulceration/compromised skin integrity Goals: Patient/caregiver will verbalize understanding of skin care regimen Date Initiated: 10/24/2019 Target Resolution Date: 04/03/2020 Goal Status: Active Ulcer/skin breakdown will have a volume reduction of 30% by week 4 Date Initiated: 10/24/2019 Date Inactivated: 01/16/2020 Target Resolution Date: 01/10/2020 Unmet Reason: no change in Goal Status: Unmet measurements. Interventions: Assess patient/caregiver ability to obtain necessary supplies Assess patient/caregiver ability to perform ulcer/skin care regimen upon admission and as needed Assess ulceration(s) every visit Provide education on ulcer and skin care Notes: Electronic Signature(s) Signed: 03/06/2020 5:01:53 PM By: Zenaida Deed RN, BSN Entered By: Zenaida Deed on 03/06/2020 16:29:25 -------------------------------------------------------------------------------- Pain Assessment Details Patient Name: Date of Service: [name], [name] 03/06/2020 3:45 PM Medical Record Number:  [id number] Patient Account Number: [id number] Date of Birth/Sex: Treating RN: 08-19-1985 (34 y.o. Lucious Groves Primary Care Joyanna Kleman: [name] Other Clinician: Referring Rodricus Candelaria: Treating Jocelyne Reinertsen/Extender: Gwyneth Revels in Treatment: 19 Active Problems Location of Pain Severity and Description of Pain Patient Has Paino Yes Site Locations Duration of the Pain. Constant / Intermittento Intermittent Rate the pain. Current Pain Level: 8 Worst Pain Level: 10 Least Pain Level: 0 Tolerable Pain Level: 8 Character of Pain Describe the Pain: Aching Pain Management and Medication Current Pain Management: Medication: Yes Cold Application: No Rest: Yes Massage: No Activity: No T.E.N.S.: No Heat Application: No Leg drop or elevation: No Is the Current Pain Management Adequate: Adequate How does your wound impact your activities of daily livingo Sleep: No Bathing: No Appetite: No Relationship With Others: No Bladder Continence: No Emotions: No Bowel Continence: No Work: No Toileting: No Drive: No Dressing: No Hobbies: No Electronic Signature(s) Signed: 03/09/2020 5:19:12 PM By: Fonnie Mu RN Entered By: Fonnie Mu on 03/06/2020 16:01:02 -------------------------------------------------------------------------------- Patient/Caregiver Education Details Patient Name: Date of Service: [name] D 12/3/2021andnbsp3:45 PM Medical Record Number: [id number] Patient Account Number: [id number] Date of Birth/Gender: Treating RN: [birth year] (34 y.o. [name] Primary Care Physician: [name] Other Clinician: Referring Physician: Treating Physician/Extender: Gwyneth Revels in Treatment: 19 Education Assessment Education Provided To: Patient Education Topics Provided Elevated Blood Sugar/ Impact on Healing: Methods: Explain/Verbal Responses: Reinforcements needed, State content  correctly Offloading: Methods: Explain/Verbal Responses: Reinforcements needed, State content correctly Wound/Skin Impairment: Methods: Explain/Verbal Responses: Reinforcements needed, State content correctly Electronic Signature(s) Signed: 03/06/2020 5:01:53 PM By: Zenaida Deed RN, BSN Entered By: Zenaida Deed on 03/06/2020 16:29:54 -------------------------------------------------------------------------------- Wound Assessment Details Patient Name: Date of Service: [name], [name] 03/06/2020 3:45 PM Medical Record Number: [id number] Patient Account Number: [id number] Date of Birth/Sex: Treating RN: 12/29/1985 (34 y.o. Lucious Groves Primary Care Cashtyn Pouliot: [name] Other Clinician: Referring Cherilynn Schomburg: Treating Harris Kistler/Extender:  Gwyneth Revelsobson, Michael [name], Ronald Weeks in Treatment: 19 Wound Status Wound Number: 3 Primary Etiology: Diabetic Wound/Ulcer of the Lower Extremity Wound Location: Left, Lateral Foot Wound Status: Open Wounding Event: Trauma Comorbid History: Type II Diabetes Date Acquired: 10/02/2019 Weeks Of Treatment: 19 Clustered Wound: No Wound Measurements Length: (cm) 2.8 Width: (cm) 3 Depth: (cm) 0.5 Area: (cm) 6.597 Volume: (cm) 3.299 Wound Description Classification: Grade 2 Wound Margin: Distinct, outline attached Exudate Amount: Medium Exudate Type: Purulent Exudate Color: yellow, brown, green Foul Odor After Cleansing: Slough/Fibrino % Reduction in Area: -300.1% % Reduction in Volume: -1899.4% Epithelialization: Small (1-33%) Tunneling: No Undermining: Yes Starting Position (o'clock): 12 Ending Position (o'clock): 12 Maximum Distance: (cm) 0.8 No Yes Wound Bed Granulation Amount: Medium (34-66%) Exposed Structure Granulation Quality: Red, Pink, Hyper-granulation Fascia Exposed: Yes Necrotic Amount: Medium (34-66%) Fat Layer (Subcutaneous Tissue) Exposed: No Tendon Exposed: No Muscle Exposed: No Joint Exposed:  No Bone Exposed: No Treatment Notes Wound #3 (Foot) Wound Laterality: Left, Lateral Cleanser Peri-Wound Care Sween Lotion (Moisturizing lotion) Discharge Instruction: Apply moisturizing lotion as directed Topical Primary Dressing Hydrofera Blue Classic Foam, 2x2 in Discharge Instruction: Apply to wound bed as instructed Secondary Dressing Woven Gauze Sponge, Non-Sterile 4x4 in Discharge Instruction: Apply over primary dressing as directed. ABD Pad, 5x9 Discharge Instruction: Apply over primary dressing as directed. Felt 2.5 yds x 5.5 in Discharge Instruction: Apply over primary dressing as callous pad Secured With Compression Wrap ThreePress (3 layer compression wrap) Discharge Instruction: Apply three layer compression, may use unna layer at top to secure wrap Compression Stockings Add-Ons Electronic Signature(s) Signed: 03/09/2020 5:19:12 PM By: Fonnie MuBreedlove, [name] RN Entered By: Fonnie MuBreedlove, [name] on 03/06/2020 16:07:22 -------------------------------------------------------------------------------- Wound Assessment Details Patient Name: Date of Service: Max [name], [name] 03/06/2020 3:45 PM Medical Record Number: [id number] Patient Account Number: [id number] Date of Birth/Sex: Treating RN: [birth year] (34 y.o. Max MerlM) Breedlove, [name] Primary Care Miyu Fenderson: Renford DillsPolite, Ronald Other Clinician: Referring Gracin Soohoo: Treating Avryl Roehm/Extender: Gwyneth Revelsobson, Michael [name], Ronald Weeks in Treatment: 19 Wound Status Wound Number: 4 Primary Etiology: Diabetic Wound/Ulcer of the Lower Extremity Wound Location: Right, Lateral Foot Wound Status: Open Wounding Event: Gradually Appeared Comorbid History: Type II Diabetes Date Acquired: 02/06/2020 Weeks Of Treatment: 3 Clustered Wound: No Wound Measurements Length: (cm) 0.3 Width: (cm) 0.5 Depth: (cm) 0.1 Area: (cm) 0.118 Volume: (cm) 0.012 % Reduction in Area: 87.2% % Reduction in Volume: 87% Epithelialization: Medium  (34-66%) Tunneling: No Undermining: No Wound Description Classification: Grade 2 Wound Margin: Distinct, outline attached Exudate Amount: Medium Exudate Type: Serosanguineous Exudate Color: red, brown Foul Odor After Cleansing: No Slough/Fibrino No Wound Bed Granulation Amount: Large (67-100%) Exposed Structure Granulation Quality: Red, Pink Fascia Exposed: No Necrotic Amount: None Present (0%) Fat Layer (Subcutaneous Tissue) Exposed: No Tendon Exposed: No Muscle Exposed: No Joint Exposed: No Bone Exposed: No Treatment Notes Wound #4 (Foot) Wound Laterality: Right, Lateral Cleanser Peri-Wound Care Sween Lotion (Moisturizing lotion) Discharge Instruction: Apply moisturizing lotion as directed Topical Primary Dressing Hydrofera Blue Classic Foam, 2x2 in Discharge Instruction: Apply to wound bed as instructed Secondary Dressing Woven Gauze Sponges 2x2 in Discharge Instruction: Apply over primary dressing as directed. Felt 2.5 yds x 5.5 in Discharge Instruction: Apply over primary dressing as callous pad Secured With Compression Wrap ThreePress (3 layer compression wrap) Discharge Instruction: Apply three layer compression, may use unna layer at top to secure wrap Compression Stockings Add-Ons Electronic Signature(s) Signed: 03/09/2020 5:19:12 PM By: Fonnie MuBreedlove, [name] RN Entered By: Fonnie MuBreedlove, [name] on 03/06/2020 16:08:28 -------------------------------------------------------------------------------- Vitals Details Patient Name:  Date of Service: [name] 03/06/2020 3:45 PM Medical Record Number: [id number] Patient Account Number: [id number] Date of Birth/Sex: Treating RN: 08-25-1985 (34 y.o. [name], [name] Primary Care Elainna Eshleman: [name] Other Clinician: Referring Zarinah Oviatt: Treating Vail Vuncannon/Extender: Gwyneth Revels in Treatment: 19 Vital Signs Time Taken: 16:04 Temperature (F): 98.4 Height (in): 77 Pulse (bpm):  83 Weight (lbs): 280 Respiratory Rate (breaths/min): 18 Body Mass Index (BMI): 33.2 Blood Pressure (mmHg): 127/83 Capillary Blood Glucose (mg/dl): 817 Reference Range: 80 - 120 mg / dl Electronic Signature(s) Signed: 03/09/2020 5:19:12 PM By: Fonnie Mu RN Entered By: Fonnie Mu on 03/06/2020 16:05:10

## 2020-03-10 ENCOUNTER — Other Ambulatory Visit: Payer: Self-pay

## 2020-03-10 ENCOUNTER — Encounter (HOSPITAL_BASED_OUTPATIENT_CLINIC_OR_DEPARTMENT_OTHER): Payer: BC Managed Care – PPO | Admitting: Internal Medicine

## 2020-03-10 DIAGNOSIS — E11621 Type 2 diabetes mellitus with foot ulcer: Secondary | ICD-10-CM | POA: Diagnosis not present

## 2020-03-12 NOTE — Progress Notes (Signed)
[place], [name] ([id number]) Visit Report for 03/10/2020 Arrival Information Details Patient Name: Date of Service: [name] 03/10/2020 12:45 PM Medical Record Number: [id number] Patient Account Number: [id number] Date of Birth/Sex: Treating RN: [birth year] (34 y.o. [name] Primary Care Kolbi Tofte: [name] Other Clinician: Referring Muneer Leider: Treating Lyzbeth Genrich/Extender: Gwyneth Revels in Treatment: 19 Visit Information History Since Last Visit Added or deleted any medications: No Patient Arrived: Ambulatory Any new allergies or adverse reactions: No Arrival Time: 12:45 Had a fall or experienced change in No Accompanied By: alone activities of daily living that may affect Transfer Assistance: None risk of falls: Patient Identification Verified: Yes Signs or symptoms of abuse/neglect since last visito No Secondary Verification Process Completed: Yes Hospitalized since last visit: No Patient Requires Transmission-Based Precautions: No Implantable device outside of the clinic excluding No Patient Has Alerts: No cellular tissue based products placed in the center since last visit: Has Dressing in Place as Prescribed: Yes Has Compression in Place as Prescribed: Yes Pain Present Now: No Electronic Signature(s) Signed: 03/12/2020 4:05:55 PM By: Zandra Abts RN, BSN Entered By: Zandra Abts on 03/10/2020 13:30:29 -------------------------------------------------------------------------------- Compression Therapy Details Patient Name: Date of Service: [name], [name] 03/10/2020 12:45 PM Medical Record Number: [id number] Patient Account Number: [id number] Date of Birth/Sex: Treating RN: Apr 25, 1985 (34 y.o. [name] Primary Care Jazline Cumbee: [name] Other Clinician: Referring Markham Dumlao: Treating Shaelin Lalley/Extender: Gwyneth Revels in Treatment: 19 Compression Therapy Performed for Wound Assessment: Wound  #3 Left,Lateral Foot Performed By: Clinician Zandra Abts, RN Compression Type: Three Emergency planning/management officer) Signed: 03/12/2020 4:05:55 PM By: Zandra Abts RN, BSN Entered By: Zandra Abts on 03/10/2020 13:31:30 -------------------------------------------------------------------------------- Compression Therapy Details Patient Name: Date of Service: [name], [name] 03/10/2020 12:45 PM Medical Record Number: [id number] Patient Account Number: [id number] Date of Birth/Sex: Treating RN: June 20, 1985 (34 y.o. [name] Primary Care Effa Yarrow: [name] Other Clinician: Referring Sachi Boulay: Treating Sadik Piascik/Extender: Gwyneth Revels in Treatment: 19 Compression Therapy Performed for Wound Assessment: Wound #4 Right,Lateral Foot Performed By: Clinician Zandra Abts, RN Compression Type: Three Emergency planning/management officer) Signed: 03/12/2020 4:05:55 PM By: Zandra Abts RN, BSN Entered By: Zandra Abts on 03/10/2020 13:31:30 -------------------------------------------------------------------------------- Encounter Discharge Information Details Patient Name: Date of Service: [name], [name] 03/10/2020 12:45 PM Medical Record Number: [id number] Patient Account Number: [id number] Date of Birth/Sex: Treating RN: 02-13-1986 (34 y.o. [name] Primary Care Natina Wiginton: [name] Other Clinician: Referring Sundra Haddix: Treating Onica Davidovich/Extender: Gwyneth Revels in Treatment: 19 Encounter Discharge Information Items Discharge Condition: Stable Ambulatory Status: Ambulatory Discharge Destination: Home Transportation: Private Auto Accompanied By: alone Schedule Follow-up Appointment: Yes Clinical Summary of Care: Patient Declined Electronic Signature(s) Signed: 03/12/2020 4:05:55 PM By: Zandra Abts RN, BSN Entered By: Zandra Abts on 03/10/2020  13:36:20 -------------------------------------------------------------------------------- Wound Assessment Details Patient Name: Date of Service: [name], [name] 03/10/2020 12:45 PM Medical Record Number: [id number] Patient Account Number: [id number] Date of Birth/Sex: Treating RN: [birth year] (34 y.o. [name] Primary Care Elio Haden: [name] Other Clinician: Referring Keion Neels: Treating Jaziah Goeller/Extender: Gwyneth Revels in Treatment: 19 Wound Status Wound Number: 3 Primary Etiology: Diabetic Wound/Ulcer of the Lower Extremity Wound Location: Left, Lateral Foot Wound Status: Open Wounding Event: Trauma Comorbid History: Type II Diabetes Date Acquired: 10/02/2019 Weeks Of Treatment: 19 Clustered Wound: No Wound Measurements Length: (cm) 2.8 Width: (cm) 3 Depth: (cm) 0.5 Area: (cm) 6.597 Volume: (cm) 3.299 %  Reduction in Area: -300.1% % Reduction in Volume: -1899.4% Epithelialization: Small (1-33%) Tunneling: No Undermining: No Wound Description Classification: Grade 2 Wound Margin: Distinct, outline attached Exudate Amount: Medium Exudate Type: Purulent Exudate Color: yellow, brown, green Foul Odor After Cleansing: No Slough/Fibrino Yes Wound Bed Granulation Amount: Medium (34-66%) Exposed Structure Granulation Quality: Red, Pink, Hyper-granulation Fascia Exposed: Yes Necrotic Amount: Medium (34-66%) Fat Layer (Subcutaneous Tissue) Exposed: No Tendon Exposed: No Muscle Exposed: No Joint Exposed: No Bone Exposed: No Treatment Notes Wound #3 (Foot) Wound Laterality: Left, Lateral Cleanser Peri-Wound Care Sween Lotion (Moisturizing lotion) Discharge Instruction: Apply moisturizing lotion as directed Topical Primary Dressing Hydrofera Blue Classic Foam, 2x2 in Discharge Instruction: Apply to wound bed as instructed Secondary Dressing Woven Gauze Sponge, Non-Sterile 4x4 in Discharge Instruction: Apply over primary  dressing as directed. ABD Pad, 5x9 Discharge Instruction: Apply over primary dressing as directed. Felt 2.5 yds x 5.5 in Discharge Instruction: Apply over primary dressing as callous pad Secured With Compression Wrap ThreePress (3 layer compression wrap) Discharge Instruction: Apply three layer compression, may use unna layer at top to secure wrap Compression Stockings Add-Ons Electronic Signature(s) Signed: 03/12/2020 4:05:55 PM By: Zandra Abts RN, BSN Entered By: Zandra Abts on 03/10/2020 13:31:08 -------------------------------------------------------------------------------- Wound Assessment Details Patient Name: Date of Service: [name], [name] 03/10/2020 12:45 PM Medical Record Number: [id number] Patient Account Number: [id number] Date of Birth/Sex: Treating RN: 02/24/1986 (34 y.o. [name] Primary Care Chase Knebel: [name] Other Clinician: Referring Derin Matthes: Treating Tameem Pullara/Extender: Gwyneth Revels in Treatment: 19 Wound Status Wound Number: 4 Primary Etiology: Diabetic Wound/Ulcer of the Lower Extremity Wound Location: Right, Lateral Foot Wound Status: Open Wounding Event: Gradually Appeared Comorbid History: Type II Diabetes Date Acquired: 02/06/2020 Weeks Of Treatment: 3 Clustered Wound: No Wound Measurements Length: (cm) 0.3 Width: (cm) 0.5 Depth: (cm) 0.1 Area: (cm) 0.118 Volume: (cm) 0.012 % Reduction in Area: 87.2% % Reduction in Volume: 87% Epithelialization: Medium (34-66%) Tunneling: No Undermining: No Wound Description Classification: Grade 2 Wound Margin: Distinct, outline attached Exudate Amount: Medium Exudate Type: Serosanguineous Exudate Color: red, brown Foul Odor After Cleansing: No Slough/Fibrino No Wound Bed Granulation Amount: Large (67-100%) Exposed Structure Granulation Quality: Red, Pink Fascia Exposed: No Necrotic Amount: None Present (0%) Fat Layer (Subcutaneous Tissue) Exposed:  No Tendon Exposed: No Muscle Exposed: No Joint Exposed: No Bone Exposed: No Treatment Notes Wound #4 (Foot) Wound Laterality: Right, Lateral Cleanser Peri-Wound Care Sween Lotion (Moisturizing lotion) Discharge Instruction: Apply moisturizing lotion as directed Topical Primary Dressing Hydrofera Blue Classic Foam, 2x2 in Discharge Instruction: Apply to wound bed as instructed Secondary Dressing Woven Gauze Sponges 2x2 in Discharge Instruction: Apply over primary dressing as directed. Felt 2.5 yds x 5.5 in Discharge Instruction: Apply over primary dressing as callous pad Secured With Compression Wrap ThreePress (3 layer compression wrap) Discharge Instruction: Apply three layer compression, may use unna layer at top to secure wrap Compression Stockings Add-Ons Electronic Signature(s) Signed: 03/12/2020 4:05:55 PM By: Zandra Abts RN, BSN Entered By: Zandra Abts on 03/10/2020 13:31:17

## 2020-03-12 NOTE — Progress Notes (Signed)
[name], [name] ([id number]) Visit Report for 03/10/2020 SuperBill Details Patient Name: Date of Service: [name] 03/10/2020 Medical Record Number: [id number] Patient Account Number: [id number] Date of Birth/Sex: Treating RN: 1985/11/27 (34 y.o. 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 Electronic Signature(s) Signed: 03/10/2020 5:06:48 PM By: Baltazar Najjar MD Signed: 03/12/2020 4:05:55 PM By: Zandra Abts RN, BSN Entered By: Zandra Abts on 03/10/2020 13:36:29

## 2020-03-13 ENCOUNTER — Other Ambulatory Visit: Payer: Self-pay

## 2020-03-13 ENCOUNTER — Encounter (HOSPITAL_BASED_OUTPATIENT_CLINIC_OR_DEPARTMENT_OTHER): Payer: BC Managed Care – PPO | Admitting: Internal Medicine

## 2020-03-13 DIAGNOSIS — E11621 Type 2 diabetes mellitus with foot ulcer: Secondary | ICD-10-CM | POA: Diagnosis not present

## 2020-03-19 NOTE — Progress Notes (Signed)
[place], [name] ([id number]) Visit Report for 03/13/2020 Arrival Information Details Patient Name: Date of Service: [name] 03/13/2020 8:15 A M Medical Record Number: [id number] Patient Account Number: [id number] Date of Birth/Sex: Treating RN: 12-Nov-1985 (34 y.o. [name], Bonita Quin Primary Care Anson Peddie: [name] Other Clinician: Referring Domenik Trice: Treating Geremy Rister/Extender: Gwyneth Revels in Treatment: 20 Visit Information History Since Last Visit Added or deleted any medications: No Patient Arrived: Ambulatory Any new allergies or adverse reactions: No Arrival Time: 08:45 Had a fall or experienced change in No Accompanied By: self activities of daily living that may affect Transfer Assistance: None risk of falls: Patient Identification Verified: Yes Signs or symptoms of abuse/neglect since last visito No Secondary Verification Process Completed: Yes Hospitalized since last visit: No Patient Requires Transmission-Based Precautions: No Implantable device outside of the clinic excluding No Patient Has Alerts: No cellular tissue based products placed in the center since last visit: Has Dressing in Place as Prescribed: Yes Pain Present Now: No Electronic Signature(s) Signed: 03/13/2020 9:52:05 AM By: Karl Ito Entered By: Karl Ito on 03/13/2020 08:45:47 -------------------------------------------------------------------------------- Compression Therapy Details Patient Name: Date of Service: [name], [name] 03/13/2020 8:15 A M Medical Record Number: [id number] Patient Account Number: [id number] Date of Birth/Sex: Treating RN: Mar 07, 1986 (34 y.o. [name] Primary Care Tameka Hoiland: [name] Other Clinician: Referring Helmi Hechavarria: Treating Hargun Spurling/Extender: Gwyneth Revels in Treatment: 20 Compression Therapy Performed for Wound Assessment: Wound #3 Left,Lateral Foot Performed By:  Clinician Shawn Stall, RN Compression Type: Three Layer Post Procedure Diagnosis Same as Pre-procedure Electronic Signature(s) Signed: 03/13/2020 5:15:15 PM By: Zenaida Deed RN, BSN Entered By: Zenaida Deed on 03/13/2020 09:12:57 -------------------------------------------------------------------------------- Compression Therapy Details Patient Name: Date of Service: [name], [name] 03/13/2020 8:15 A M Medical Record Number: [id number] Patient Account Number: [id number] Date of Birth/Sex: Treating RN: 29-Jan-1986 (34 y.o. [name] Primary Care Ailyne Pawley: [name] Other Clinician: Referring Britainy Kozub: Treating Larnie Heart/Extender: Gwyneth Revels in Treatment: 20 Compression Therapy Performed for Wound Assessment: Wound #4 Right,Lateral Foot Performed By: Clinician Shawn Stall, RN Compression Type: Three Layer Post Procedure Diagnosis Same as Pre-procedure Electronic Signature(s) Signed: 03/13/2020 5:15:15 PM By: Zenaida Deed RN, BSN Entered By: Zenaida Deed on 03/13/2020 09:12:57 -------------------------------------------------------------------------------- Encounter Discharge Information Details Patient Name: Date of Service: [name], [name] 03/13/2020 8:15 A M Medical Record Number: [id number] Patient Account Number: [id number] Date of Birth/Sex: Treating RN: [birth year] (34 y.o. [name] Primary Care Toni Demo: [name] Other Clinician: Referring Dolan Xia: Treating Dhalia Zingaro/Extender: Gwyneth Revels in Treatment: 20 Encounter Discharge Information Items Post Procedure Vitals Discharge Condition: Stable Temperature (F): 98.4 Ambulatory Status: Ambulatory Pulse (bpm): 102 Discharge Destination: Home Respiratory Rate (breaths/min): 18 Transportation: Private Auto Blood Pressure (mmHg): 126/76 Accompanied By: self Schedule Follow-up Appointment: Yes Clinical Summary of  Care: Electronic Signature(s) Signed: 03/13/2020 2:12:11 PM By: Shawn Stall Entered By: Shawn Stall on 03/13/2020 11:49:28 -------------------------------------------------------------------------------- Lower Extremity Assessment Details Patient Name: Date of Service: [name] 03/13/2020 8:15 A M Medical Record Number: [id number] Patient Account Number: [id number] Date of Birth/Sex: Treating RN: 10/07/1985 (34 y.o. [name] Primary Care Kirke Breach: [name] Other Clinician: Referring Abisai Coble: Treating Johann Santone/Extender: Gwyneth Revels in Treatment: 20 Edema Assessment Assessed: [name]: No] Franne Forts: No] Edema: [Left: No] [Right: No] Calf Left: Right: Point of Measurement: 48 cm From Medial Instep 45.5 cm 42 cm Ankle Left: Right: Point of Measurement: 10 cm  From Medial Instep 32.5 cm 30.2 cm Vascular Assessment Pulses: Dorsalis Pedis Palpable: [Left:No] [Right:No] Electronic Signature(s) Signed: 03/13/2020 5:15:15 PM By: Zenaida Deed RN, BSN Entered By: Zenaida Deed on 03/13/2020 09:07:54 -------------------------------------------------------------------------------- Multi Wound Chart Details Patient Name: Date of Service: [name], [name] 03/13/2020 8:15 A M Medical Record Number: [id number] Patient Account Number: [id number] Date of Birth/Sex: Treating RN: [birth year] (34 y.o. [name] Primary Care Ashawna Hanback: [name] Other Clinician: Referring Carnella Fryman: Treating Ximenna Fonseca/Extender: Gwyneth Revels in Treatment: 20 Vital Signs Height(in): 77 Capillary Blood Glucose(mg/dl): 732 Weight(lbs): 202 Pulse(bpm): 102 Body Mass Index(BMI): 33 Blood Pressure(mmHg): 126/76 Temperature(F): 98.4 Respiratory Rate(breaths/min): 18 Photos: [3:No Photos Left, Lateral Foot] [4:No Photos Right, Lateral Foot] [N/A:N/A N/A] Wound Location: [3:Trauma] [4:Gradually Appeared]  [N/A:N/A] Wounding Event: [3:Diabetic Wound/Ulcer of the Lower] [4:Diabetic Wound/Ulcer of the Lower] [N/A:N/A] Primary Etiology: [3:Extremity Type II Diabetes] [4:Extremity Type II Diabetes] [N/A:N/A] Comorbid History: [3:10/02/2019] [4:02/06/2020] [N/A:N/A] Date Acquired: [3:20] [4:4] [N/A:N/A] Weeks of Treatment: [3:Open] [4:Open] [N/A:N/A] Wound Status: [3:2.9x3x1.1] [4:0.4x0.7x0.1] [N/A:N/A] Measurements L x W x [name] [3:6.833] [4:0.22] [N/A:N/A] A (cm) : rea [3:7.516] [4:0.022] [N/A:N/A] Volume (cm) : [3:-314.40%] [4:76.10%] [N/A:N/A] % Reduction in A rea: [3:-4455.20%] [4:76.10%] [N/A:N/A] % Reduction in Volume: [3:12] Starting Position 1 (o'clock): [3:12] Ending Position 1 (o'clock): [3:1] Maximum Distance 1 (cm): [3:Yes] [4:No] [N/A:N/A] Undermining: [3:Grade 2] [4:Grade 2] [N/A:N/A] Classification: [3:Large] [4:Small] [N/A:N/A] Exudate A mount: [3:Serosanguineous] [4:Serosanguineous] [N/A:N/A] Exudate Type: [3:red, brown] [4:red, brown] [N/A:N/A] Exudate Color: [3:Well defined, not attached] [4:Distinct, outline attached] [N/A:N/A] Wound Margin: [3:Small (1-33%)] [4:Large (67-100%)] [N/A:N/A] Granulation A mount: [3:Red, Pink, Hyper-granulation] [4:Red] [N/A:N/A] Granulation Quality: [3:Large (67-100%)] [4:None Present (0%)] [N/A:N/A] Necrotic A mount: [3:Fat Layer (Subcutaneous Tissue): Yes Fat Layer (Subcutaneous Tissue): Yes N/A] Exposed Structures: [3:Fascia: No Tendon: No Muscle: No Joint: No Bone: No None] [4:Fascia: No Tendon: No Muscle: No Joint: No Bone: No Small (1-33%)] [N/A:N/A] Epithelialization: [3:Debridement - Excisional] [4:N/A] [N/A:N/A] Debridement: [3:09:13] [4:N/A] [N/A:N/A] Pre-procedure Verification/Time Out Taken: [3:Other] [4:N/A] [N/A:N/A] Pain Control: [3:Subcutaneous, Slough] [4:N/A] [N/A:N/A] Tissue Debrided: [3:Skin/Subcutaneous Tissue] [4:N/A] [N/A:N/A] Level: [3:8.7] [4:N/A] [N/A:N/A] Debridement A (sq cm): [3:rea Curette] [4:N/A]  [N/A:N/A] Instrument: [3:Minimum] [4:N/A] [N/A:N/A] Bleeding: [3:Pressure] [4:N/A] [N/A:N/A] Hemostasis A chieved: [3:0] [4:N/A] [N/A:N/A] Procedural Pain: [3:0] [4:N/A] [N/A:N/A] Post Procedural Pain: [3:Procedure was tolerated well] [4:N/A] [N/A:N/A] Debridement Treatment Response: [3:2.9x3x1.1] [4:N/A] [N/A:N/A] Post Debridement Measurements L x W x [name] [3:7.516] [4:N/A] [N/A:N/A] Post Debridement Volume: (cm) [3:Compression Therapy] [4:Compression Therapy] [N/A:N/A] Procedures Performed: [3:Debridement] Treatment Notes Electronic Signature(s) Signed: 03/13/2020 5:15:15 PM By: Zenaida Deed RN, BSN Signed: 03/19/2020 8:09:40 AM By: Baltazar Najjar MD Entered By: Baltazar Najjar on 03/13/2020 09:38:26 -------------------------------------------------------------------------------- Multi-Disciplinary Care Plan Details Patient Name: Date of Service: [name], [name] 03/13/2020 8:15 A M Medical Record Number: [id number] Patient Account Number: [id number] Date of Birth/Sex: Treating RN: [birth year] (34 y.o. [name] Primary Care Micah Galeno: [name] Other Clinician: Referring Patric Vanpelt: Treating Neria Procter/Extender: Gwyneth Revels in Treatment: 20 Active Inactive Nutrition Nursing Diagnoses: Imbalanced nutrition Potential for alteratiion in Nutrition/Potential for imbalanced nutrition Goals: Patient/caregiver agrees to and verbalizes understanding of need to use nutritional supplements and/or vitamins as prescribed Date Initiated: 10/24/2019 Target Resolution Date: 04/03/2020 Goal Status: Active Patient/caregiver will maintain therapeutic glucose control Date Initiated: 10/24/2019 Target Resolution Date: 04/03/2020 Goal Status: Active Interventions: Assess HgA1c results as ordered upon admission and as needed Assess patient nutrition upon admission and as needed per policy Provide education on elevated blood sugars and impact  on wound  healing Provide education on nutrition Treatment Activities: Education provided on Nutrition : 12/13/2019 Notes: Wound/Skin Impairment Nursing Diagnoses: Impaired tissue integrity Knowledge deficit related to ulceration/compromised skin integrity Goals: Patient/caregiver will verbalize understanding of skin care regimen Date Initiated: 10/24/2019 Target Resolution Date: 04/03/2020 Goal Status: Active Ulcer/skin breakdown will have a volume reduction of 30% by week 4 Date Initiated: 10/24/2019 Date Inactivated: 01/16/2020 Target Resolution Date: 01/10/2020 Unmet Reason: no change in Goal Status: Unmet measurements. Interventions: Assess patient/caregiver ability to obtain necessary supplies Assess patient/caregiver ability to perform ulcer/skin care regimen upon admission and as needed Assess ulceration(s) every visit Provide education on ulcer and skin care Notes: Electronic Signature(s) Signed: 03/13/2020 5:15:15 PM By: Zenaida DeedBoehlein, Linda RN, BSN Entered By: Zenaida DeedBoehlein, Linda on 03/13/2020 09:13:24 -------------------------------------------------------------------------------- Pain Assessment Details Patient Name: Date of Service: [name], [name] 03/13/2020 8:15 A M Medical Record Number: [id number] Patient Account Number: [id number] Date of Birth/Sex: Treating RN: [birth year] (34 y.o. [name]) Boehlein, Linda Primary Care Marvella Jenning: Renford DillsPolite, Ronald Other Clinician: Referring Tiran Sauseda: Treating Cristin Szatkowski/Extender: Gwyneth Revelsobson, Michael [name], Ronald Weeks in Treatment: 20 Active Problems Location of Pain Severity and Description of Pain Patient Has Paino No Site Locations Pain Management and Medication Current Pain Management: Electronic Signature(s) Signed: 03/13/2020 9:52:05 AM By: Karl Itoawkins, Destiny Signed: 03/13/2020 5:15:15 PM By: Zenaida DeedBoehlein, Linda RN, BSN Entered By: Karl Itoawkins, Destiny on 03/13/2020  08:55:44 -------------------------------------------------------------------------------- Patient/Caregiver Education Details Patient Name: Date of Service: [name], [name] 12/10/2021andnbsp8:15 A M Medical Record Number: [id number] Patient Account Number: [id number] Date of Birth/Gender: Treating RN: [birth year] (34 y.o. [name]) Boehlein, Linda Primary Care Physician: Renford DillsPolite, Ronald Other Clinician: Referring Physician: Treating Physician/Extender: Gwyneth Revelsobson, Michael [name], Ronald Weeks in Treatment: 20 Education Assessment Education Provided To: Patient Education Topics Provided Elevated Blood Sugar/ Impact on Healing: Methods: Explain/Verbal Responses: Reinforcements needed, State content correctly Offloading: Methods: Explain/Verbal Responses: Reinforcements needed, State content correctly Wound/Skin Impairment: Methods: Explain/Verbal Responses: Reinforcements needed, State content correctly Electronic Signature(s) Signed: 03/13/2020 5:15:15 PM By: Zenaida DeedBoehlein, Linda RN, BSN Entered By: Zenaida DeedBoehlein, Linda on 03/13/2020 09:13:52 -------------------------------------------------------------------------------- Wound Assessment Details Patient Name: Date of Service: [name], [name] 03/13/2020 8:15 A M Medical Record Number: [id number] Patient Account Number: [id number] Date of Birth/Sex: Treating RN: [birth year] (34 y.o. [name]) Boehlein, Linda Primary Care Jaizon Deroos: Renford DillsPolite, Ronald Other Clinician: Referring Afomia Blackley: Treating Tyeasha Ebbs/Extender: Gwyneth Revelsobson, Michael [name], Ronald Weeks in Treatment: 20 Wound Status Wound Number: 3 Primary Etiology: Diabetic Wound/Ulcer of the Lower Extremity Wound Location: Left, Lateral Foot Wound Status: Open Wounding Event: Trauma Comorbid History: Type II Diabetes Date Acquired: 10/02/2019 Weeks Of Treatment: 20 Clustered Wound: No Photos Photo Uploaded By: Benjaman KindlerJones, Dedrick on 03/18/2020 13:16:23 Wound Measurements Length: (cm) 2.9 Width:  (cm) 3 Depth: (cm) 1.1 Area: (cm) 6.833 Volume: (cm) 7.516 % Reduction in Area: -314.4% % Reduction in Volume: -4455.2% Epithelialization: None Tunneling: No Undermining: Yes Starting Position (o'clock): 12 Ending Position (o'clock): 12 Maximum Distance: (cm) 1 Wound Description Classification: Grade 2 Wound Margin: Well defined, not attached Exudate Amount: Large Exudate Type: Serosanguineous Exudate Color: red, brown Foul Odor After Cleansing: No Slough/Fibrino Yes Wound Bed Granulation Amount: Small (1-33%) Exposed Structure Granulation Quality: Red, Pink, Hyper-granulation Fascia Exposed: No Necrotic Amount: Large (67-100%) Fat Layer (Subcutaneous Tissue) Exposed: Yes Necrotic Quality: Adherent Slough Tendon Exposed: No Muscle Exposed: No Joint Exposed: No Bone Exposed: No Treatment Notes Wound #3 (Foot) Wound Laterality: Left, Lateral Cleanser Peri-Wound Care Sween Lotion (Moisturizing lotion) Discharge Instruction: Apply moisturizing lotion as directed Topical Primary Dressing Hydrofera Blue  Classic Foam, 2x2 in Discharge Instruction: Apply to wound bed as instructed Secondary Dressing Woven Gauze Sponge, Non-Sterile 4x4 in Discharge Instruction: Apply over primary dressing as directed. ABD Pad, 5x9 Discharge Instruction: Apply over primary dressing as directed. Felt 2.5 yds x 5.5 in Discharge Instruction: Apply over primary dressing as callous pad Secured With Compression Wrap ThreePress (3 layer compression wrap) Discharge Instruction: Apply three layer compression, may use unna layer at top to secure wrap Compression Stockings Add-Ons Electronic Signature(s) Signed: 03/13/2020 5:15:15 PM By: Zenaida Deed RN, BSN Entered By: Zenaida Deed on 03/13/2020 09:10:25 -------------------------------------------------------------------------------- Wound Assessment Details Patient Name: Date of Service: [name], [name] 03/13/2020 8:15 A  M Medical Record Number: [id number] Patient Account Number: [id number] Date of Birth/Sex: Treating RN: 12-08-1985 (34 y.o. [name] Primary Care Susana Gripp: [name] Other Clinician: Referring Jamani Bearce: Treating Kayman Snuffer/Extender: Gwyneth Revels in Treatment: 20 Wound Status Wound Number: 4 Primary Etiology: Diabetic Wound/Ulcer of the Lower Extremity Wound Location: Right, Lateral Foot Wound Status: Open Wounding Event: Gradually Appeared Comorbid History: Type II Diabetes Date Acquired: 02/06/2020 Weeks Of Treatment: 4 Clustered Wound: No Photos Photo Uploaded By: [name] on 03/18/2020 13:16:23 Wound Measurements Length: (cm) 0.4 Width: (cm) 0.7 Depth: (cm) 0.1 Area: (cm) 0.22 Volume: (cm) 0.022 % Reduction in Area: 76.1% % Reduction in Volume: 76.1% Epithelialization: Small (1-33%) Tunneling: No Undermining: No Wound Description Classification: Grade 2 Wound Margin: Distinct, outline attached Exudate Amount: Small Exudate Type: Serosanguineous Exudate Color: red, brown Foul Odor After Cleansing: No Slough/Fibrino No Wound Bed Granulation Amount: Large (67-100%) Exposed Structure Granulation Quality: Red Fascia Exposed: No Necrotic Amount: None Present (0%) Fat Layer (Subcutaneous Tissue) Exposed: Yes Tendon Exposed: No Muscle Exposed: No Joint Exposed: No Bone Exposed: No Treatment Notes Wound #4 (Foot) Wound Laterality: Right, Lateral Cleanser Peri-Wound Care Sween Lotion (Moisturizing lotion) Discharge Instruction: Apply moisturizing lotion as directed Topical Primary Dressing Hydrofera Blue Classic Foam, 2x2 in Discharge Instruction: Apply to wound bed as instructed Secondary Dressing Woven Gauze Sponges 2x2 in Discharge Instruction: Apply over primary dressing as directed. Felt 2.5 yds x 5.5 in Discharge Instruction: Apply over primary dressing as callous pad Secured With Compression Wrap ThreePress  (3 layer compression wrap) Discharge Instruction: Apply three layer compression, may use unna layer at top to secure wrap Compression Stockings Add-Ons Electronic Signature(s) Signed: 03/13/2020 5:15:15 PM By: Zenaida Deed RN, BSN Entered By: Zenaida Deed on 03/13/2020 54:27:06 -------------------------------------------------------------------------------- Vitals Details Patient Name: Date of Service: [name], [name] 03/13/2020 8:15 A M Medical Record Number: [id number] Patient Account Number: [id number] Date of Birth/Sex: Treating RN: November 21, 1985 (34 y.o. [name] Primary Care Shashwat Cleary: [name] Other Clinician: Referring Eythan Jayne: Treating Novelle Addair/Extender: Gwyneth Revels in Treatment: 20 Vital Signs Time Taken: 08:54 Temperature (F): 98.4 Height (in): 77 Pulse (bpm): 102 Weight (lbs): 280 Respiratory Rate (breaths/min): 18 Body Mass Index (BMI): 33.2 Blood Pressure (mmHg): 126/76 Capillary Blood Glucose (mg/dl): 176 Reference Range: 80 - 120 mg / dl Electronic Signature(s) Signed: 03/13/2020 9:52:05 AM By: Karl Ito Entered By: Karl Ito on 03/13/2020 08:55:25

## 2020-03-19 NOTE — Progress Notes (Signed)
[name], [name] ([id number]) Visit Report for 03/13/2020 Debridement Details Patient Name: Date of Service: [name] 03/13/2020 8:15 A M Medical Record Number: [id number] Patient Account Number: [id number] Date of Birth/Sex: Treating RN: Nov 21, 1985 (34 y.o. [name] Primary Care Provider: [name] Other Clinician: Referring Provider: Treating Provider/Extender: [name] in Treatment: 20 Debridement Performed for Assessment: Wound #3 Left,Lateral Foot Performed By: Physician [name], MD Debridement Type: Debridement Severity of Tissue Pre Debridement: Fat layer exposed Level of Consciousness (Pre-procedure): Awake and Alert Pre-procedure Verification/Time Out Yes - 09:13 Taken: Start Time: 09:14 Pain Control: Other : benzocaine 20% spray T Area Debrided (L x W): otal 2.9 (cm) x 3 (cm) = 8.7 (cm) Tissue and other material debrided: Viable, Non-Viable, Slough, Subcutaneous, Skin: Epidermis, Slough Level: Skin/Subcutaneous Tissue Debridement Description: Excisional Instrument: Curette Bleeding: Minimum Hemostasis Achieved: Pressure End Time: 09:17 Procedural Pain: 0 Post Procedural Pain: 0 Response to Treatment: Procedure was tolerated well Level of Consciousness (Post- Awake and Alert procedure): Post Debridement Measurements of Total Wound Length: (cm) 2.9 Width: (cm) 3 Depth: (cm) 1.1 Volume: (cm) 7.516 Character of Wound/Ulcer Post Debridement: Requires Further Debridement Severity of Tissue Post Debridement: Fat layer exposed Post Procedure Diagnosis Same as Pre-procedure Electronic Signature(s) Signed: 03/13/2020 5:15:15 PM By: Baruch Gouty RN, BSN Signed: 03/19/2020 8:09:40 AM By: [name] MD Entered By: [name] on 03/13/2020 09:38:36 -------------------------------------------------------------------------------- HPI Details Patient Name: Date of Service: [name], [name]  03/13/2020 8:15 A M Medical Record Number: [id number] Patient Account Number: [id number] Date of Birth/Sex: Treating RN: 19-Jul-1985 (34 y.o. 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 He comes in for review of a wound over the plantar fifth metatarsal head extending into the lateral part of the foot. He was followed for this previously by his podiatrist Dr. Cornelius Moras. As the patient tells his story he went to see podiatry first for a swelling he developed on the lateral part of his fifth metatarsal head in May. He states this was "open" by podiatry and the area closed. He was followed up in June and it was again opened callus removed and it closed promptly. There were plans being made for surgery on the fifth metatarsal head in June however his blood sugar was apparently too high for anesthesia. Apparently the area was debrided and opened again in June and it is never closed since. Looking over the records from podiatry I am really not able to follow this. It was clear when he was first seen it was before 5/14 at that point he already had a wound. By 5/17 the ulcer was resolved. I do not see anything about a procedure. On 5/28 noted to have pre-ulcerative moderate keratosis. X-ray noted 1/5 contracted toe and tailor's bunion and metatarsal deformity. On a visit date on 09/28/2018 the dorsal part of the left foot it healed and resolved. There was concern about swelling in his lower extremity he was sent to the ER.. As far as I can tell he was seen in the ER on 7/12 with an ulcer on his left foot. A DVT rule out of the left leg was negative. I do not think I have complete records from podiatry but I am not able to verify the  procedures  this patient states he had. He states after the last procedure the wound has never closed although I am not able to follow this in the records I have from podiatry. He has not had a recent x-ray The patient has been using Neosporin on the wound. He is wearing a Darco shoe. He is still very active up on his foot working and exercising. Past medical history; type 2 diabetes ketosis-prone, leg swelling with a negative DVT study in July. Non-smoker ABI in our clinic was 0.85 on the left 10/16; substantial wound on the plantar left fifth met head extending laterally almost to the dorsal fifth MTP. We have been using silver alginate we gave him a Darco forefoot off loader. An x-ray did not show evidence of osteomyelitis did note soft tissue emphysema which I think was due to gas tracking through an open wound. There is no doubt in my mind he requires an MRI 10/23; MRI not booked until 3 November at the earliest this is largely due to his glucose sensor in the right arm. We have been using silver alginate. There has been an improvement 10/29; I am still not exactly sure when his MRI is booked for. He says it is the third but it is the 10th in epic. This definitely needs to be done. He is running a low-grade fever today but no other symptoms. No real improvement in the 1 02/26/2019 patient presents today for a follow-up visit here in our clinic he is last been seen in the clinic on October 29. Subsequently we were working on getting MRI to evaluate and see what exactly was going on and where we would need to go from the standpoint of whether or not he had osteomyelitis and again what treatments were going be required. Subsequently the patient ended up being admitted to the hospital on 02/07/2019 and was discharged on 02/14/2019. This is a somewhat interesting admission with a discharge diagnosis of pneumonia due to COVID-19 although he was positive for COVID-19 when tested at the urgent care but  negative x2 when he was actually in the hospital. With that being said he did have acute respiratory failure with hypoxia and it was noted he also have a left foot ulceration with osteomyelitis. With that being said he did require oxygen for his pneumonia and I level 4 L. He was placed on antivirals and steroids for the COVID-19. He was also transferred to the [facility] at one point. Nonetheless he did subsequently discharged home and since being home has done much better in that regard. The CT angiogram did not show any pulmonary embolism. With regard to the osteomyelitis the patient was placed on vancomycin and Zosyn while in the hospital but has been changed to Augmentin at discharge. It was also recommended that he follow- up with wound care and podiatry. Podiatry however wanted him to see [name] according to the patient prior to them doing anything further. His hemoglobin A1c was 9.9 as noted in the hospital. Have an MRI of the left foot performed while in the hospital on 02/04/2019. 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 There is an overlying plantar open wound noted an abscess tracking back along the lateral aspect of the fifth metatarsal shaft. There is otherwise diffuse cellulitis and mild fasciitis without findings of polymyositis. The patient did have recently pneumonia secondary to COVID-19 I looked in the chart through epic and it  does appear that the patient may need to have an additional x-ray just to ensure everything is cleared and that he has no airspace disease prior to putting him into the [name]. 03/05/2019; patient was readmitted to the clinic last week. He was hospitalized twice for a viral upper respiratory tract infection from 11/1 through 11/4 and then 11/5 through 11/12 ultimately this turned out to be Covid pneumonitis. Although he was discharged on oxygen he is not using  it. He says he feels fine. He has no exercise limitation no cough no sputum. His O2 sat in our clinic today was 100% on room air. 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 He received Vanco and Zosyn in the hospital and then was discharged on 2 weeks of Augmentin. I do not see any relevant cultures. He was supposed to follow-up with infectious disease but I do not see that he has an appointment. 12/8; patient saw Dr. Novella Olive of infectious disease last week. He felt that he had had adequate antibiotic therapy. He did not go to follow-up with Dr. Amalia Hailey of podiatry and I have again talked to him about the pros and cons of this. He does not want to consider a ray amputation of this time. He is aware of the risks of recurrence, migration etc. He started HBO today and tolerated this well. He can complete the Augmentin that I gave him last week. I have looked over the lab work that Dr. Chana Bode ordered his C-reactive protein was 3.3 and his sedimentation rate was 17. The C-reactive protein is never really been measurably that high in this patient 12/15; not much change in the wound today however he has undermining along the lateral part of the foot again more extensively than last week. He has some rims of epithelialization. We have been using silver alginate. He is undergoing hyperbarics but did not dive today 12/18; in for his obligatory first total contact cast change. Unfortunately there was pus coming from the undermining area around his fifth metatarsal head. This was cultured but will preclude reapplication of a cast. He is seen in conjunction with HBO 12/24; patient had staph lugdunensis in the wound in the undermining area laterally last time. We put him on doxycycline which should have covered this. The wound looks better today. I am going to give him another week of doxycycline before  reattempting the total contact cast 12/31; the patient is completing antibiotics. Hemorrhagic debris in the distal part of the wound with some undermining distally. He also had hyper granulation. Extensive debridement with a #5 curette. The infected area that was on the lateral part of the fifth met head is closed over. I do not think he needs any more antibiotics. Patient was seen prior to HBO. Preparations for a total contact cast were made in the cast will be placed post hyperbarics 04/11/19; once again the patient arrives today without complaint. He had been in a cast all week noted that he had heavy drainage this week. This resulted in large raised areas of macerated tissue around the wound 1/14; wound bed looks better slightly smaller. Hydrofera Blue has been changing himself. He had a heavy drainage last week which caused a lot of maceration around the wound so I took him out of a total contact cast he says the drainage is actually better this week He is seen today in conjunction with HBO  1/21; returns to clinic. He was up in [place] for a day or 2 attending a funeral. He comes back in with the wound larger and with a large area of exposed bone. He had osteomyelitis and septic arthritis of the fifth left metatarsal head while he was in hospital. He received IV antibiotics in the hospital for a prolonged period of time then 3 weeks of Augmentin. Subsequently I gave him 2 weeks of doxycycline for more superficial wound infection. When I saw this last week the wound was smaller the surface of the wound looks satisfactory. 1/28; patient missed hyperbarics today. Bone biopsy I did last time showed Enterococcus faecalis and Staphylococcus lugdunensis . He has a wide area of exposed bone. We are going to use silver alginate as of today. I had another ethical discussion with the patient. 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 In this situation I think  the likelihood of healing this is low. Therefore I have recommended a ray amputation and with the patient's agreement I have referred him to Dr. Doran Durand. The other issue is that his compliance with hyperbarics has been minimal because of his work schedule and given his underlying decision I am going to stop this today READMISSION 10/24/2019 MRI 09/29/2019 left foot IMPRESSION: 1. Apparent skin ulceration inferior and lateral to the 5th metatarsal base with underlying heterogeneous T2 signal and enhancement in the subcutaneous fat. Small peripherally enhancing fluid collections along the plantar and lateral aspects of the 5th metatarsal base suspicious for abscesses. 2. Interval amputation through the mid 5th metatarsal with nonspecific low-level marrow edema and enhancement. Given the proximity to the adjacent soft tissue inflammatory changes, osteomyelitis cannot be excluded. 3. The additional bones appear unremarkable. MRI 09/29/2019 right foot IMPRESSION: 1. Soft tissue ulceration lateral to the 5th MTP joint. There is low-level T2 hyperintensity within the 4th and 5th metatarsal heads and adjacent proximal phalanges without abnormal T1 signal or cortical destruction. These findings are nonspecific and could be seen with early marrow edema, hyperemia or early osteomyelitis. No evidence of septic joint. 2. Mild tenosynovitis and synovial enhancement associated with the extensor digitorum tendons at the level of the midfoot. 3. Diffuse low-level muscular T2 hyperintensity and enhancement, most consistent with diabetic myopathy. LEFT FOOT BONE Methicillin resistant staphylococcus aureus Staphylococcus lugdunensis MIC MIC CIPROFLOXACIN >=8 RESISTANT Resistant <=0.5 SENSI... Sensitive CLINDAMYCIN <=0.25 SENS... Sensitive >=8 RESISTANT Resistant ERYTHROMYCIN >=8 RESISTANT Resistant >=8 RESISTANT Resistant GENTAMICIN <=0.5 SENSI... Sensitive <=0.5 SENSI... Sensitive Inducible Clindamycin  NEGATIVE Sensitive NEGATIVE Sensitive OXACILLIN >=4 RESISTANT Resistant 2 SENSITIVE Sensitive RIFAMPIN <=0.5 SENSI... Sensitive <=0.5 SENSI... Sensitive TETRACYCLINE <=1 SENSITIVE Sensitive <=1 SENSITIVE Sensitive TRIMETH/SULFA <=10 SENSIT Sensitive <=10 SENSIT Sensitive ... Marland Kitchen.. VANCOMYCIN 1 SENSITIVE Sensitive <=0.5 SENSI... Sensitive Right foot bone . Component 3 wk ago Specimen Description BONE Special Requests RIGHT 4 METATARSAL SAMPLE B Gram Stain NO WBC SEEN NO ORGANISMS SEEN Culture RARE METHICILLIN RESISTANT STAPHYLOCOCCUS AUREUS NO ANAEROBES ISOLATED Performed at [facility], [place] [address], [place], [place] [address] Report Status 10/08/2019 FINAL Organism ID, Bacteria METHICILLIN RESISTANT STAPHYLOCOCCUS AUREUS Resulting Agency [facility] Susceptibility Methicillin resistant staphylococcus aureus MIC CIPROFLOXACIN >=8 RESISTANT Resistant CLINDAMYCIN <=0.25 SENS... Sensitive ERYTHROMYCIN >=8 RESISTANT Resistant GENTAMICIN <=0.5 SENSI... Sensitive Inducible Clindamycin NEGATIVE Sensitive OXACILLIN >=4 RESISTANT Resistant RIFAMPIN <=0.5 SENSI... Sensitive TETRACYCLINE <=1 SENSITIVE Sensitive TRIMETH/SULFA <=10 SENSIT Sensitive ... VANCOMYCIN 1 SENSITIVE Sensitive This is a patient we had in clinic earlier this year with a wound over his left fifth  metatarsal head. 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 Doran Durand on 05/16/2019. He seems to have developed problems with wounds on his bilateral feet in June 2021 although he may have had problems earlier than this. He was in an urgent care with a right foot ulcer on 09/26/2019 and given a course of doxycycline. This was apparently after  having trouble getting into see orthopedics. He was seen by podiatry on 09/28/2019 noted to have bilateral lower extremity ulcers including the left lateral fifth metatarsal base and the right subfifth met head. It was noted that had purulent drainage at that time. He required hospitalization from 6/20 through 7/2. This was because of worsening right foot wounds. He underwent bilateral operative incision and drainage and bone biopsies bilaterally. Culture results are listed above. He has been referred back to clinic by Dr. Jacqualyn Posey of podiatry. He is also followed by Dr. Megan Salon who saw him yesterday. He was discharged from hospital on Zyvox Flagyl and Levaquin and yesterday changed to doxycycline Flagyl and Levaquin. His inflammatory markers on 6/26 showed a sedimentation rate of 129 and a C-reactive protein of 5. This is improved to 14 and 1.3 respectively. This would indicate improvement. ABIs in our clinic today were 1.23 on the right and 1.20 on the left 11/01/2019 on evaluation today patient appears to be doing fairly well in regard to the wounds on his feet at this point. Fortunately there is no signs of active infection at this time. No fevers, chills, nausea, vomiting, or diarrhea. He currently is seeing infectious disease and still under their care at this point. Subsequently he also has both wounds which she has not been using collagen on as he did not receive that in his packaging he did not call us and let us know that. Apparently that just was missed on the order. Nonetheless we will get that straightened out today. 8/9-Patient returns for bilateral foot wounds, using Prisma with hydrogel moistened dressings, and the wounds appear stable. Patient using surgical shoes, avoiding much pressure or weightbearing as much as possible 8/16; patient has bilateral foot wounds. 1 on the right lateral foot proximally the other is on the left mid lateral foot. Both required debridement of callus  and thick skin around the wounds. We have been using silver collagen 8/27; patient has bilateral lateral foot wounds. The area on the left substantially surrounded by callus and dry skin. This was removed from the wound edge. The underlying wound is small. The area on the right measured somewhat smaller today. 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 Unfortunately I did not update his antibiotics during today's visit. 9/10 I reviewed Dr. Hale Bogus last notes he felt he had completed antibiotics his inflammatory markers were reasonably well controlled. He has a small wound on the lateral left foot and a tiny area on the right which is just above closed. He is using Hydrofera Blue with border foam he has bilateral surgical shoes 9/24; 2 week f/u. doing well. right foot is closed. left foot still undermined. 10/14; right foot remains closed at the fifth met head. The area over the base of the left fifth metatarsal has a small open area but considerable undermining  towards the plantar foot. Thick callus skin around this suggests an adequate pressure relief. We have talked about this. He says he is going to go back into his cam boot. I suggested a total contact cast he did not seem enamored with this suggestion 10/26; left foot base of the fifth metatarsal. Same condition as last time. He has skin over the area with an open wound however the skin is not adherent. He went to see Dr. Earleen Newport who did an x-ray and culture of his foot I have not reviewed the x-ray but the patient was not told anything. He is on doxycycline 11/11; since the patient was last here he was in the emergency room on 10/30 he was concerned about swelling in the left foot. They did not do any cultures or x-rays. They changed his antibiotics to cephalexin. Previous culture showed group B strep. The cephalexin is appropriate as doxycycline has less than predictable  coverage. Arrives in clinic today with swelling over this area under the wound. He also has a new wound on the right fifth metatarsal head 11/18; the patient has a difficult wound on the lateral aspect of the left fifth metatarsal head. The wound was almost ballotable last week I opened it slightly expecting to see purulence however there was just bleeding. I cultured this this was negative. X-ray unchanged. We are trying to get an MRI but I am not sure were going to be able to get this through his insurance. He also has an area on the right lateral fifth metatarsal head this looks healthier 12/3; the patient finally got our MRI. Surprisingly this did not show osteomyelitis. I did show the soft tissue ulceration at the lateral plantar aspect of the fifth metatarsal base with a tiny residual 6 mm abscess overlying the superficial fascia I have tried to culture this area I have not been able to get this to grow anything. Nevertheless the protruding tissue looks aggravated. I suspect we should try to treat the underlying "abscess with broad-spectrum antibiotics. I am going to start him on Levaquin and Flagyl. He has much less edema in his legs and I am going to continue to wrap his legs and see him weekly 12/10. I started Levaquin and Flagyl on him last week. He just picked up the Flagyl apparently there was some delay. The worry is the wound on the left fifth metatarsal base which is substantial and worsening. His foot looks like he inverts at the ankle making this a weightbearing surface. Certainly no improvement in fact I think the measurements of this are somewhat worse. We have been using Electronic Signature(s) Signed: 03/19/2020 8:09:40 AM By: [name] MD Entered By: [name] on 03/13/2020 09:40:14 -------------------------------------------------------------------------------- Physical Exam Details Patient Name: Date of Service: [name], [name] 03/13/2020 8:15 A M Medical  Record Number: [id number] Patient Account Number: [id number] Date of Birth/Sex: Treating RN: [birth year] (34 y.o. [name] Primary Care Provider: [name] Other Clinician: Referring Provider: Treating Provider/Extender: [name] in Treatment: 20 Constitutional Sitting or standing Blood Pressure is within target range for patient.. Pulse regular and within target range for patient.Marland Kitchen Respirations regular, non-labored and within target range.. Temperature is normal and within the target range for the patient.Marland Kitchen Appears in no distress. Cardiovascular Pedal pulses are palpable. Notes Wound exam; The left lateral fifth metatarsal base wound is substantial and worsening. He has protruding tissue that does not look particularly healthy. Cultures not done are negative. His MRI  showed an abscess in this area at 6 mm although they comment that this was improved from prior study. However based on this I am going to give him a prolonged course of Levaquin and Flagyl The right lateral fifth metatarsal head wound is smaller but still with some depth and undermining Electronic Signature(s) Signed: 03/19/2020 8:09:40 AM By: [name] MD Entered By: [name] on 03/13/2020 09:41:53 -------------------------------------------------------------------------------- Physician Orders Details Patient Name: Date of Service: [name], [name] 03/13/2020 8:15 A M Medical Record Number: [id number] Patient Account Number: [id number] Date of Birth/Sex: Treating RN: 08-11-1985 (33 y.o. [name] Primary Care Provider: [name] Other Clinician: Referring Provider: Treating Provider/Extender: [name] in Treatment: 20 Verbal / Phone Orders: No Diagnosis Coding ICD-10 Coding Code Description E11.621 Type 2 diabetes mellitus with foot ulcer L97.528 Non-pressure chronic ulcer of other part of left foot with other  specified severity L97.518 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ity Follow-up Appointments ppointment in 1 week. - plan cast next week Return A ppointment in 2 weeks. - will need appointment Thurs. 12/23 for cast change Return A Bathing/ Shower/ Hygiene May shower with protection but do not get wound dressing(s) wet. - may use cast protectors Edema Control - Lymphedema / SCD / Other Bilateral Lower Extremities Elevate legs to the level of the heart or above for 30 minutes daily and/or when sitting, a frequency of: Avoid standing for long periods of time. Exercise regularly Off-Loading Open toe surgical shoe to: - both feet, pad left shoe with felt to offload lateral foot Wound Treatment Wound #3 - Foot Wound Laterality: Left, Lateral Peri-Wound Care: Sween Lotion (Moisturizing lotion) 1 x Per Week Discharge Instructions: Apply moisturizing lotion as directed Prim Dressing: Hydrofera Blue Classic Foam, 2x2 in 1 x Per Week ary Discharge Instructions: Apply to wound bed as instructed Secondary Dressing: Woven Gauze Sponge, Non-Sterile 4x4 in 1 x Per Week Discharge Instructions: Apply over primary dressing as directed. Secondary Dressing: ABD Pad, 5x9 1 x Per Week Discharge Instructions: Apply over primary dressing as directed. Secondary Dressing: Felt 2.5 yds x 5.5 in 1 x Per Week Discharge Instructions: Apply over primary dressing as callous pad Compression Wrap: ThreePress (3 layer compression wrap) 1 x Per Week Discharge Instructions: Apply three layer compression, may use unna layer at top to secure wrap Wound #4 - Foot Wound Laterality: Right, Lateral Peri-Wound Care: Sween Lotion (Moisturizing lotion) Discharge Instructions: Apply moisturizing lotion as directed Prim Dressing: Hydrofera Blue Classic Foam, 2x2 in ary Discharge Instructions: Apply to wound bed as instructed Secondary Dressing: Woven Gauze Sponges 2x2 in Discharge  Instructions: Apply over primary dressing as directed. Secondary Dressing: Felt 2.5 yds x 5.5 in Discharge Instructions: Apply over primary dressing as callous pad Compression Wrap: ThreePress (3 layer compression wrap) Discharge Instructions: Apply three layer compression, may use unna layer at top to secure wrap Patient Medications llergies: metformin A Notifications Medication Indication Start End prior to debridement 03/13/2020 benzocaine DOSE topical 20 % aerosol - aerosol topical Electronic Signature(s) Signed: 03/13/2020 5:15:15 PM By: Baruch Gouty RN, BSN Signed: 03/19/2020 8:09:40 AM By: [name] MD Entered By: Baruch Gouty on 03/13/2020 09:21:54 -------------------------------------------------------------------------------- Problem List Details Patient Name: Date of Service: [name], [name] 03/13/2020 8:15 A M Medical Record Number: [id number] Patient Account Number: [id number] Date of Birth/Sex: Treating RN: 09/16/1985 (34 y.o. 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, right ankle and foot 10/24/2019 10/24/2019 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o. 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 He comes in for review of a wound over the plantar fifth metatarsal head extending into the lateral part of the foot. He was followed for this previously by his podiatrist Dr. Cornelius Moras. As the patient tells his story he went to see podiatry first for a swelling he developed on the lateral part of his fifth metatarsal head in May. He states this was "open" by podiatry and the area closed. He was followed up in June and it was again opened callus removed and it closed promptly. There were plans being made for surgery on the fifth metatarsal head in June however his blood sugar was apparently too high for anesthesia. Apparently the area was debrided and opened again in June and it is never closed since. Looking over the records from podiatry I am really not able to follow this. It was clear when he was first seen it was before 5/14 at that point he already had a wound. By 5/17 the ulcer was resolved. I do not see anything about a procedure. On 5/28 noted to have pre-ulcerative moderate keratosis. X-ray noted 1/5 contracted toe and tailor's bunion and metatarsal deformity. On a visit date on 09/28/2018 the dorsal part of the left foot it healed and resolved. There was concern  about swelling in his lower extremity he was sent to the ER.. As far as I can tell he was seen in the ER on 7/12 with an ulcer on his left foot. A DVT rule out of the left leg was negative. I do not think I have complete records from podiatry but I am not able to verify the procedures this patient states he had. He states after the last procedure the wound has never closed although I am not able to follow this in the records I have from podiatry. He has not had a recent x-ray The patient has been using Neosporin on the wound. He is wearing a Darco shoe. He is still very active up on his foot working and exercising. Past medical history; type 2 diabetes ketosis-prone, leg swelling with a negative DVT study in July. Non-smoker ABI in our clinic was 0.85 on the left 10/16; substantial wound on the plantar left fifth met head extending laterally almost to the dorsal fifth MTP. We have been using silver  alginate we gave him a Darco forefoot off loader. An x-ray did not show evidence of osteomyelitis did note soft tissue emphysema which I think was due to gas tracking through an open wound. There is no doubt in my mind he requires an MRI 10/23; MRI not booked until 3 November at the earliest this is largely due to his glucose sensor in the right arm. We have been using silver alginate. There has been an improvement 10/29; I am still not exactly sure when his MRI is booked for. He says it is the third but it is the 10th in epic. This definitely needs to be done. He is running a low-grade fever today but no other symptoms. No real improvement in the 1 02/26/2019 patient presents today for a follow-up visit here in our clinic he is last been seen in the clinic on October 29. Subsequently we were working on getting MRI to evaluate and see what exactly was going on and where we would need to go from the standpoint of whether or not he had osteomyelitis and again what treatments were going be required.  Subsequently the patient ended up being admitted to the hospital on 02/07/2019 and was discharged on 02/14/2019. This is a somewhat interesting admission with a discharge diagnosis of pneumonia due to COVID-19 although he was positive for COVID-19 when tested at the urgent care but negative x2 when he was actually in the hospital. With that being said he did have acute respiratory failure with hypoxia and it was noted he also have a left foot ulceration with osteomyelitis. With that being said he did require oxygen for his pneumonia and I level 4 L. He was placed on antivirals and steroids for the COVID-19. He was also transferred to the [facility] at one point. Nonetheless he did subsequently discharged home and since being home has done much better in that regard. The CT angiogram did not show any pulmonary embolism. With regard to the osteomyelitis the patient was placed on vancomycin and Zosyn while in the hospital but has been changed to Augmentin at discharge. It was also recommended that he follow- up with wound care and podiatry. Podiatry however wanted him to see [name] according to the patient prior to them doing anything further. His hemoglobin A1c was 9.9 as noted in the hospital. Have an MRI of the left foot performed while in the hospital on 02/04/2019. 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 There is an overlying plantar open wound noted an abscess tracking back along the lateral aspect of the fifth metatarsal shaft. There is otherwise diffuse cellulitis and mild fasciitis without findings of polymyositis. The patient did have recently pneumonia secondary to COVID-19 I looked in the chart through epic and it does appear that the patient may need to have an additional x-ray just to ensure everything is cleared and that he has no airspace disease prior to putting him into the  [name]. 03/05/2019; patient was readmitted to the clinic last week. He was hospitalized twice for a viral upper respiratory tract infection from 11/1 through 11/4 and then 11/5 through 11/12 ultimately this turned out to be Covid pneumonitis. Although he was discharged on oxygen he is not using it. He says he feels fine. He has no exercise limitation no cough no sputum. His O2 sat in our clinic today was 100% on room air. 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 He received Vanco and Zosyn in the hospital and then was discharged on 2 weeks of Augmentin. I do not see any relevant cultures. He was supposed to follow-up with infectious disease but I do not see that he has an appointment. 12/8; patient saw Dr. Novella Olive of infectious disease last week. He felt that he had had adequate antibiotic therapy. He did not go to follow-up with Dr. Amalia Hailey of podiatry and I have again talked to him about the pros and cons of this. He does not want to consider a ray amputation of this time. He is aware of the risks of recurrence, migration etc. He started HBO today and tolerated this well. He can complete the Augmentin that I gave him last week. I have looked over the lab work that Dr. Chana Bode ordered his C-reactive protein was 3.3 and his sedimentation rate was 17. The C-reactive protein is never really been measurably that high in this patient 12/15; not much change in the wound today however he has undermining along the lateral part of the foot again more extensively than last week. He has some rims of epithelialization. We have been using silver alginate. He is undergoing hyperbarics but did not dive today 12/18; in for his obligatory first total contact cast change. Unfortunately there was pus coming from the undermining area around his fifth metatarsal head. This was cultured but will preclude  reapplication of a cast. He is seen in conjunction with HBO 12/24; patient had staph lugdunensis in the wound in the undermining area laterally last time. We put him on doxycycline which should have covered this. The wound looks better today. I am going to give him another week of doxycycline before reattempting the total contact cast 12/31; the patient is completing antibiotics. Hemorrhagic debris in the distal part of the wound with some undermining distally. He also had hyper granulation. Extensive debridement with a #5 curette. The infected area that was on the lateral part of the fifth met head is closed over. I do not think he needs any more antibiotics. Patient was seen prior to HBO. Preparations for a total contact cast were made in the cast will be placed post hyperbarics 04/11/19; once again the patient arrives today without complaint. He had been in a cast all week noted that he had heavy drainage this week. This resulted in large raised areas of macerated tissue around the wound 1/14; wound bed looks better slightly smaller. Hydrofera Blue has been changing himself. He had a heavy drainage last week which caused a lot of maceration around the wound so I took him out of a total contact cast he says the drainage is actually better this week He is seen today in conjunction with HBO 1/21; returns to clinic. He was up in [place] for a day or 2 attending a funeral. He comes back in with the wound larger and with a large area of exposed bone. He had osteomyelitis and septic arthritis of the fifth left metatarsal head while he was in hospital. He received IV antibiotics in the hospital for a prolonged period of time then 3 weeks of Augmentin. Subsequently I gave him 2 weeks of doxycycline for more superficial wound infection. When I saw this last week the wound was smaller the surface of the wound looks satisfactory. 1/28; patient missed hyperbarics today. Bone biopsy I did last time showed  Enterococcus faecalis and  Staphylococcus lugdunensis . He has a wide area of exposed bone. We are going to use silver alginate as of today. I had another ethical discussion with the patient. 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 In this situation I think the likelihood of healing this is low. Therefore I have recommended a ray amputation and with the patient's agreement I have referred him to Dr. Doran Durand. The other issue is that his compliance with hyperbarics has been minimal because of his work schedule and given his underlying decision I am going to stop this today READMISSION 10/24/2019 MRI 09/29/2019 left foot IMPRESSION: 1. Apparent skin ulceration inferior and lateral to the 5th metatarsal base with underlying heterogeneous T2 signal and enhancement in the subcutaneous fat. Small peripherally enhancing fluid collections along the plantar and lateral aspects of the 5th metatarsal base suspicious for abscesses. 2. Interval amputation through the mid 5th metatarsal with nonspecific low-level marrow edema and enhancement. Given the proximity to the adjacent soft tissue inflammatory changes, osteomyelitis cannot be excluded. 3. The additional bones appear unremarkable. MRI 09/29/2019 right foot IMPRESSION: 1. Soft tissue ulceration lateral to the 5th MTP joint. There is low-level T2 hyperintensity within the 4th and 5th metatarsal heads and adjacent proximal phalanges without abnormal T1 signal or cortical destruction. These findings are nonspecific and could be seen with early marrow edema, hyperemia or early osteomyelitis. No evidence of septic joint. 2. Mild tenosynovitis and synovial enhancement associated with the extensor digitorum tendons at the level of the midfoot. 3. Diffuse low-level muscular T2 hyperintensity and enhancement, most consistent with diabetic myopathy. LEFT FOOT BONE Methicillin resistant staphylococcus aureus  Staphylococcus lugdunensis MIC MIC CIPROFLOXACIN >=8 RESISTANT Resistant <=0.5 SENSI... Sensitive CLINDAMYCIN <=0.25 SENS... Sensitive >=8 RESISTANT Resistant ERYTHROMYCIN >=8 RESISTANT Resistant >=8 RESISTANT Resistant GENTAMICIN <=0.5 SENSI... Sensitive <=0.5 SENSI... Sensitive Inducible Clindamycin NEGATIVE Sensitive NEGATIVE Sensitive OXACILLIN >=4 RESISTANT Resistant 2 SENSITIVE Sensitive RIFAMPIN <=0.5 SENSI... Sensitive <=0.5 SENSI... Sensitive TETRACYCLINE <=1 SENSITIVE Sensitive <=1 SENSITIVE Sensitive TRIMETH/SULFA <=10 SENSIT Sensitive <=10 SENSIT Sensitive ... Marland Kitchen.. VANCOMYCIN 1 SENSITIVE Sensitive <=0.5 SENSI... Sensitive Right foot bone . Component 3 wk ago Specimen Description BONE Special Requests RIGHT 4 METATARSAL SAMPLE B Gram Stain NO WBC SEEN NO ORGANISMS SEEN Culture RARE METHICILLIN RESISTANT STAPHYLOCOCCUS AUREUS NO ANAEROBES ISOLATED Performed at [facility], [place] [address], [place], [place] [address] Report Status 10/08/2019 FINAL Organism ID, Bacteria METHICILLIN RESISTANT STAPHYLOCOCCUS AUREUS Resulting Agency [facility] Susceptibility Methicillin resistant staphylococcus aureus MIC CIPROFLOXACIN >=8 RESISTANT Resistant CLINDAMYCIN <=0.25 SENS... Sensitive ERYTHROMYCIN >=8 RESISTANT Resistant GENTAMICIN <=0.5 SENSI... Sensitive Inducible Clindamycin NEGATIVE Sensitive OXACILLIN >=4 RESISTANT Resistant RIFAMPIN <=0.5 SENSI... Sensitive TETRACYCLINE <=1 SENSITIVE Sensitive TRIMETH/SULFA <=10 SENSIT Sensitive ... VANCOMYCIN 1 SENSITIVE Sensitive This is a patient we had in clinic earlier this year with a wound over his left fifth metatarsal head. 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 Doran Durand on 05/16/2019. He seems to have developed problems with wounds on his bilateral feet in June 2021 although he may have had problems earlier than this. He was in an urgent care with a right foot ulcer on 09/26/2019 and given a course of doxycycline. This was apparently after having trouble getting into see orthopedics. He was seen by  podiatry on 09/28/2019 noted to have bilateral lower extremity ulcers including the left lateral fifth metatarsal base and the right subfifth met head. It was noted that had purulent drainage at that time. He required hospitalization from 6/20 through 7/2. This was because of worsening right foot wounds. He underwent bilateral operative incision and drainage and bone biopsies bilaterally. Culture results are listed above. He has been referred back to clinic by Dr. Jacqualyn Posey of podiatry. He is also followed by Dr. Megan Salon who saw him yesterday. He was discharged from hospital on Zyvox Flagyl and Levaquin and yesterday changed to doxycycline Flagyl and Levaquin. His inflammatory markers on 6/26 showed a sedimentation rate of 129 and a C-reactive protein of 5. This is improved to 14 and 1.3 respectively. This would indicate improvement. ABIs in our clinic today were 1.23 on the right and 1.20 on the left 11/01/2019 on evaluation today patient appears to be doing fairly well in regard to the wounds on his feet at this point. Fortunately there is no signs of active infection at this time. No fevers, chills, nausea, vomiting, or diarrhea. He currently is seeing infectious disease and still under their care at this point. Subsequently he also has both wounds which she has not been using collagen on as he did not receive that in his packaging he did not call us and let us know that. Apparently that just was missed on the order. Nonetheless we will get that straightened out today. 8/9-Patient returns for bilateral foot wounds, using Prisma with  hydrogel moistened dressings, and the wounds appear stable. Patient using surgical shoes, avoiding much pressure or weightbearing as much as possible 8/16; patient has bilateral foot wounds. 1 on the right lateral foot proximally the other is on the left mid lateral foot. Both required debridement of callus and thick skin around the wounds. We have been using silver collagen 8/27; patient has bilateral lateral foot wounds. The area on the left substantially surrounded by callus and dry skin. This was removed from the wound edge. The underlying wound is small. The area on the right measured somewhat smaller today. 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 Unfortunately I did not update his antibiotics during today's visit. 9/10 I reviewed Dr. Hale Bogus last notes he felt he had completed antibiotics his inflammatory markers were reasonably well controlled. He has a small wound on the lateral left foot and a tiny area on the right which is just above closed. He is using Hydrofera Blue with border foam he has bilateral surgical shoes 9/24; 2 week f/u. doing well. right foot is closed. left foot still undermined. 10/14; right foot remains closed at the fifth met head. The area over the base of the left fifth metatarsal has a small open area but considerable undermining towards the plantar foot. Thick callus skin around this suggests an adequate pressure relief. We have talked about this. He says he is going to go back into his cam boot. I suggested a total contact cast he did not seem enamored with this suggestion 10/26; left foot base of the fifth metatarsal. Same condition as last time. He has skin over the area with an open wound however the skin is not adherent. He went to see Dr. Earleen Newport who did an x-ray and culture of his foot I have not reviewed the x-ray but the patient was not told anything. He is on doxycycline 11/11; since the  patient was last here  he was in the emergency room on 10/30 he was concerned about swelling in the left foot. They did not do any cultures or x-rays. They changed his antibiotics to cephalexin. Previous culture showed group B strep. The cephalexin is appropriate as doxycycline has less than predictable coverage. Arrives in clinic today with swelling over this area under the wound. He also has a new wound on the right fifth metatarsal head 11/18; the patient has a difficult wound on the lateral aspect of the left fifth metatarsal head. The wound was almost ballotable last week I opened it slightly expecting to see purulence however there was just bleeding. I cultured this this was negative. X-ray unchanged. We are trying to get an MRI but I am not sure were going to be able to get this through his insurance. He also has an area on the right lateral fifth metatarsal head this looks healthier 12/3; the patient finally got our MRI. Surprisingly this did not show osteomyelitis. I did show the soft tissue ulceration at the lateral plantar aspect of the fifth metatarsal base with a tiny residual 6 mm abscess overlying the superficial fascia I have tried to culture this area I have not been able to get this to grow anything. Nevertheless the protruding tissue looks aggravated. I suspect we should try to treat the underlying "abscess with broad-spectrum antibiotics. I am going to start him on Levaquin and Flagyl. He has much less edema in his legs and I am going to continue to wrap his legs and see him weekly 12/10. I started Levaquin and Flagyl on him last week. He just picked up the Flagyl apparently there was some delay. The worry is the wound on the left fifth metatarsal base which is substantial and worsening. His foot looks like he inverts at the ankle making this a weightbearing surface. Certainly no improvement in fact I think the measurements of this are somewhat worse. We have been  using Objective Constitutional Sitting or standing Blood Pressure is within target range for patient.. Pulse regular and within target range for patient.Marland Kitchen Respirations regular, non-labored and within target range.. Temperature is normal and within the target range for the patient.Marland Kitchen Appears in no distress. Vitals Time Taken: 8:54 AM, Height: 77 in, Weight: 280 lbs, BMI: 33.2, Temperature: 98.4 F, Pulse: 102 bpm, Respiratory Rate: 18 breaths/min, Blood Pressure: 126/76 mmHg, Capillary Blood Glucose: 140 mg/dl. Cardiovascular Pedal pulses are palpable. General Notes: Wound exam; ooThe left lateral fifth metatarsal base wound is substantial and worsening. He has protruding tissue that does not look particularly healthy. Cultures not done are negative. His MRI showed an abscess in this area at 6 mm although they comment that this was improved from prior study. However based on this I am going to give him a prolonged course of Levaquin and Flagyl ooThe right lateral fifth metatarsal head wound is smaller but still with some depth and undermining Integumentary (Hair, Skin) Wound #3 status is Open. Original cause of wound was Trauma. The wound is located on the Left,Lateral Foot. The wound measures 2.9cm length x 3cm width x 1.1cm depth; 6.833cm^2 area and 7.516cm^3 volume. There is Fat Layer (Subcutaneous Tissue) exposed. There is no tunneling noted, however, there is undermining starting at 12:00 and ending at 12:00 with a maximum distance of 1cm. There is a large amount of serosanguineous drainage noted. The wound margin is well defined and not attached to the wound base. There is small (1-33%) red, pink, hyper - granulation within the wound  bed. There is a large (67-100%) amount of necrotic tissue within the wound bed including Adherent Slough. Wound #4 status is Open. Original cause of wound was Gradually Appeared. The wound is located on the Right,Lateral Foot. The wound measures 0.4cm length x  0.7cm width x 0.1cm depth; 0.22cm^2 area and 0.022cm^3 volume. There is Fat Layer (Subcutaneous Tissue) exposed. There is no tunneling or undermining noted. There is a small amount of serosanguineous drainage noted. The wound margin is distinct with the outline attached to the wound base. There is large (67- 100%) red granulation within the wound bed. There is no necrotic tissue within the wound bed. 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 There was a Excisional Skin/Subcutaneous Tissue Debridement with a total area of 8.7 sq cm performed by [name], MD. With the following instrument(s): Curette to remove Viable and Non-Viable tissue/material. Material removed includes Subcutaneous Tissue, Slough, and Skin: Epidermis after achieving pain control using Other (benzocaine 20% spray). No specimens were taken. A time out was conducted at 09:13, prior to the start of the procedure. A Minimum amount of bleeding was controlled with Pressure. The procedure was tolerated well with a pain level of 0 throughout and a pain level of 0 following the procedure. Post Debridement Measurements: 2.9cm length x 3cm width x 1.1cm depth; 7.516cm^3 volume. Character of Wound/Ulcer Post Debridement requires further debridement. Severity of Tissue Post Debridement is: Fat layer exposed. Post procedure Diagnosis Wound #3: Same as Pre-Procedure Pre-procedure diagnosis of Wound #3 is a Diabetic Wound/Ulcer of the Lower Extremity located on the Left,Lateral Foot . There was a Three Layer Compression Therapy Procedure by  Deon Pilling, RN. Post procedure Diagnosis Wound #3: Same as Pre-Procedure Wound #4 Pre-procedure diagnosis of Wound #4 is a Diabetic Wound/Ulcer of the Lower Extremity located on the Right,Lateral Foot . There was a Three Layer Compression Therapy Procedure by Deon Pilling, RN. Post procedure Diagnosis Wound #4: Same as Pre-Procedure Plan Follow-up Appointments: Return Appointment in 1 week. - plan cast next week Return Appointment in 2 weeks. - will need appointment Thurs. 12/23 for cast change Bathing/ Shower/ Hygiene: May shower with protection but do not get wound dressing(s) wet. - may use cast protectors Edema Control - Lymphedema / SCD / Other: Elevate legs to the level of the heart or above for 30 minutes daily and/or when sitting, a frequency of: Avoid standing for long periods of time. Exercise regularly Off-Loading: Open toe surgical shoe to: - both feet, pad left shoe with felt to offload lateral foot The following medication(s) was prescribed: benzocaine topical 20 % aerosol aerosol topical for prior to debridement was prescribed at facility WOUND #3: - Foot Wound Laterality: Left, Lateral Peri-Wound Care: Sween Lotion (Moisturizing lotion) 1 x Per Week/ Discharge Instructions: Apply moisturizing lotion as directed Prim Dressing: Hydrofera Blue Classic Foam, 2x2 in 1 x Per Week/ ary Discharge Instructions: Apply to wound bed as instructed Secondary Dressing: Woven Gauze Sponge, Non-Sterile 4x4 in 1 x Per Week/ Discharge Instructions: Apply over primary dressing as directed. Secondary Dressing: ABD Pad, 5x9 1 x Per Week/ Discharge Instructions: Apply over primary dressing as directed. Secondary Dressing:  Felt 2.5 yds x 5.5 in 1 x Per Week/ Discharge Instructions: Apply over primary dressing as callous pad Com pression Wrap: ThreePress (3 layer compression wrap) 1 x Per Week/ Discharge Instructions: Apply three layer compression, may use unna layer at top to secure  wrap WOUND #4: - Foot Wound Laterality: Right, Lateral Peri-Wound Care: Sween Lotion (Moisturizing lotion) Discharge Instructions: Apply moisturizing lotion as directed Prim Dressing: Hydrofera Blue Classic Foam, 2x2 in ary Discharge Instructions: Apply to wound bed as instructed Secondary Dressing: Woven Gauze Sponges 2x2 in Discharge Instructions: Apply over primary dressing as directed. Secondary Dressing: Felt 2.5 yds x 5.5 in Discharge Instructions: Apply over primary dressing as callous pad Com pression Wrap: ThreePress (3 layer compression wrap) Discharge Instructions: Apply three layer compression, may use unna layer at top to secure wrap 1. Continue Levaquin and Flagyl. He just started the Levaquin yesterday 2. Certainly no improvement on the left lateral foot wound. I am continuing with Hydrofera Blue under 3 layer compression. 3. By next week he should have a weeks worth of antibiotics I am going to put him in a total contact cast. I think he is inverting at the ankle when he walks making the lateral foot a weightbearing surface. 5. The wound on the right lateral fifth met head looks better. Hydrofera Blue here as well. Electronic Signature(s) Signed: 03/19/2020 8:09:40 AM By: [name] MD Entered By: [name] on 03/13/2020 09:43:47 -------------------------------------------------------------------------------- SuperBill Details Patient Name: Date of Service: [name], [name] 03/13/2020 Medical Record Number: [id number] Patient Account Number: [id number] Date of Birth/Sex: Treating RN: 05/31/1985 (34 y.o. [name] Primary Care Provider: [name] Other Clinician: Referring Provider: Treating Provider/Extender: [name] in Treatment: 20 Diagnosis Coding ICD-10 Codes Code Description [phone] Type 2 diabetes mellitus with foot ulcer L97.528 Non-pressure chronic ulcer of other part of left foot with other  specified severity L97.518 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ity Facility Procedures CPT4 Code: [id number] Description: 96759 - DEB SUBQ TISSUE 20 SQ CM/< ICD-10 Diagnosis Description L97.528 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ity Modifier: Quantity: 1 CPT4 Code: [id number] Description: (Facility Use Only) 99357SV - APPLY MULTLAY COMPRS LWR RT LEG Modifier: 83 Quantity: 1 Physician Procedures : CPT4 Code Description Modifier [id number] 00923 - WC PHYS SUBQ TISS 20 SQ CM ICD-10 Diagnosis Description L97.528 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ity Quantity: 1 Electronic Signature(s) Signed: 03/19/2020 8:09:40 AM By: [name] MD Entered By: [name] on 03/13/2020 09:44:08

## 2020-03-20 ENCOUNTER — Encounter (HOSPITAL_BASED_OUTPATIENT_CLINIC_OR_DEPARTMENT_OTHER): Payer: BC Managed Care – PPO | Admitting: Internal Medicine

## 2020-03-20 ENCOUNTER — Other Ambulatory Visit: Payer: Self-pay

## 2020-03-20 DIAGNOSIS — E11621 Type 2 diabetes mellitus with foot ulcer: Secondary | ICD-10-CM | POA: Diagnosis not present

## 2020-03-20 NOTE — Progress Notes (Signed)
[place], [name] ([id number]) Visit Report for 03/20/2020 Arrival Information Details Patient Name: Date of Service: [name] 03/20/2020 3:15 PM Medical Record Number: [id number] Patient Account Number: [id number] Date of Birth/Sex: Treating RN: 1985/06/29 (34 y.o. [name]) Yevonne Pax Primary Care Lucindia Lemley: [name] Other Clinician: Referring Matther Labell: Treating Monzerat Handler/Extender: Gwyneth Revels in Treatment: 21 Visit Information History Since Last Visit All ordered tests and consults were completed: No Patient Arrived: Ambulatory Added or deleted any medications: No Arrival Time: 16:02 Any new allergies or adverse reactions: No Accompanied By: self Had a fall or experienced change in No Transfer Assistance: None activities of daily living that may affect Patient Identification Verified: Yes risk of falls: Secondary Verification Process Completed: Yes Signs or symptoms of abuse/neglect since last visito No Patient Requires Transmission-Based Precautions: No Hospitalized since last visit: No Patient Has Alerts: No Implantable device outside of the clinic excluding No cellular tissue based products placed in the center since last visit: Has Dressing in Place as Prescribed: Yes Has Compression in Place as Prescribed: Yes Pain Present Now: No Electronic Signature(s) Signed: 03/20/2020 5:23:39 PM By: Yevonne Pax RN Entered By: Yevonne Pax on 03/20/2020 16:04:24 -------------------------------------------------------------------------------- Compression Therapy Details Patient Name: Date of Service: [name] 03/20/2020 3:15 PM Medical Record Number: [id number] Patient Account Number: [id number] Date of Birth/Sex: Treating RN: 01/06/1986 (34 y.o. [name] Primary Care Shyna Duignan: [name] Other Clinician: Referring Margretta Zamorano: Treating Daquane Aguilar/Extender: Gwyneth Revels in Treatment: 21 Compression  Therapy Performed for Wound Assessment: Wound #3 Left,Lateral Foot Performed By: Clinician Zandra Abts, RN Compression Type: Four Layer Post Procedure Diagnosis Same as Pre-procedure Electronic Signature(s) Signed: 03/20/2020 5:46:53 PM By: Zandra Abts RN, BSN Entered By: Zandra Abts on 03/20/2020 16:31:20 -------------------------------------------------------------------------------- Compression Therapy Details Patient Name: Date of Service: [name], [name] 03/20/2020 3:15 PM Medical Record Number: [id number] Patient Account Number: [id number] Date of Birth/Sex: Treating RN: 05/18/1985 (34 y.o. [name] Primary Care Clayden Withem: [name] Other Clinician: Referring Timoteo Carreiro: Treating Keona Sheffler/Extender: Gwyneth Revels in Treatment: 21 Compression Therapy Performed for Wound Assessment: Wound #4 Right,Lateral Foot Performed By: Clinician Zandra Abts, RN Compression Type: Four Layer Post Procedure Diagnosis Same as Pre-procedure Electronic Signature(s) Signed: 03/20/2020 5:46:53 PM By: Zandra Abts RN, BSN Entered By: Zandra Abts on 03/20/2020 16:31:20 -------------------------------------------------------------------------------- Encounter Discharge Information Details Patient Name: Date of Service: [name], [name] 03/20/2020 3:15 PM Medical Record Number: [id number] Patient Account Number: [id number] Date of Birth/Sex: Treating RN: 22-Oct-1985 (34 y.o. [name] Primary Care Marybelle Giraldo: [name] Other Clinician: Referring Layza Summa: Treating Seana Underwood/Extender: Gwyneth Revels in Treatment: 21 Encounter Discharge Information Items Discharge Condition: Stable Ambulatory Status: Ambulatory Discharge Destination: Home Transportation: Private Auto Accompanied By: self Schedule Follow-up Appointment: Yes Clinical Summary of Care: Electronic Signature(s) Signed: 03/20/2020 5:32:58 PM  By: Shawn Stall Entered By: Shawn Stall on 03/20/2020 17:27:45 -------------------------------------------------------------------------------- Lower Extremity Assessment Details Patient Name: Date of Service: [name] 03/20/2020 3:15 PM Medical Record Number: [id number] Patient Account Number: [id number] Date of Birth/Sex: Treating RN: Jun 04, 1985 (34 y.o. [name] Primary Care Yashar Inclan: [name] Other Clinician: Referring Brenee Gajda: Treating Vessie Olmsted/Extender: Gwyneth Revels in Treatment: 21 Edema Assessment Assessed: [name]: No] Franne Forts: No] Edema: [Left: No] [Right: No] Calf Left: Right: Point of Measurement: 48 cm From Medial Instep 54 cm 48 cm Ankle Left: Right: Point of Measurement: 10 cm From Medial Instep 34 cm 31  cm Electronic Signature(s) Signed: 03/20/2020 5:23:39 PM By: Yevonne PaxEpps, Carrie RN Entered By: Yevonne PaxEpps, Carrie on 03/20/2020 16:06:40 -------------------------------------------------------------------------------- Multi Wound Chart Details Patient Name: Date of Service: Max Bushman RMSTRO NG, [name] 03/20/2020 3:15 PM Medical Record Number: [id number] Patient Account Number: [id number] Date of Birth/Sex: Treating RN: 23-Sep-1985 (34 y.o. Max KollerM) Lynch, Shatara Primary Care Yanna Leaks: Renford DillsPolite, Ronald Other Clinician: Referring Amiri Tritch: Treating Kynleigh Artz/Extender: Gwyneth Revelsobson, Michael [name], Ronald Weeks in Treatment: 21 Vital Signs Height(in): 77 Pulse(bpm): 101 Weight(lbs): 280 Blood Pressure(mmHg): 137/85 Body Mass Index(BMI): 33 Temperature(F): 98.7 Respiratory Rate(breaths/min): 18 Photos: [3:No Photos Left, Lateral Foot] [4:No Photos Right, Lateral Foot] [N/A:N/A N/A] Wound Location: [3:Trauma] [4:Gradually Appeared] [N/A:N/A] Wounding Event: [3:Diabetic Wound/Ulcer of the Lower] [4:Diabetic Wound/Ulcer of the Lower] [N/A:N/A] Primary Etiology: [3:Extremity Type II Diabetes] [4:Extremity Type II Diabetes] [N/A:N/A] Comorbid  History: [3:10/02/2019] [4:02/06/2020] [N/A:N/A] Date Acquired: [3:21] [4:5] [N/A:N/A] Weeks of Treatment: [3:Open] [4:Open] [N/A:N/A] Wound Status: [3:2.7x3.3x1] [4:0.3x0.5x0.1] [N/A:N/A] Measurements L x W x [name] [3:6.998] [4:0.118] [N/A:N/A] A (cm) : rea [3:6.998] [4:0.012] [N/A:N/A] Volume (cm) : [3:-324.40%] [4:87.20%] [N/A:N/A] % Reduction in A rea: [3:-4141.20%] [4:87.00%] [N/A:N/A] % Reduction in Volume: [3:Grade 2] [4:Grade 2] [N/A:N/A] Classification: [3:Large] [4:Small] [N/A:N/A] Exudate A mount: [3:Serosanguineous] [4:Serosanguineous] [N/A:N/A] Exudate Type: [3:red, brown] [4:red, brown] [N/A:N/A] Exudate Color: [3:Well defined, not attached] [4:Distinct, outline attached] [N/A:N/A] Wound Margin: [3:Small (1-33%)] [4:Large (67-100%)] [N/A:N/A] Granulation A mount: [3:Red, Pink, Hyper-granulation] [4:Red] [N/A:N/A] Granulation Quality: [3:Large (67-100%)] [4:None Present (0%)] [N/A:N/A] Necrotic A mount: [3:Fat Layer (Subcutaneous Tissue): Yes Fat Layer (Subcutaneous Tissue): Yes N/A] Exposed Structures: [3:Fascia: No Tendon: No Muscle: No Joint: No Bone: No None] [4:Fascia: No Tendon: No Muscle: No Joint: No Bone: No Small (1-33%)] [N/A:N/A] Epithelialization: [3:Compression Therapy] [4:Compression Therapy] [N/A:N/A] Treatment Notes Electronic Signature(s) Signed: 03/20/2020 4:58:57 PM By: Baltazar Najjarobson, Michael MD Signed: 03/20/2020 5:46:53 PM By: Zandra AbtsLynch, Shatara RN, BSN Entered By: Baltazar Najjarobson, Michael on 03/20/2020 16:51:26 -------------------------------------------------------------------------------- Multi-Disciplinary Care Plan Details Patient Name: Date of Service: Max [name], [name] 03/20/2020 3:15 PM Medical Record Number: [id number] Patient Account Number: [id number] Date of Birth/Sex: Treating RN: 23-Sep-1985 (34 y.o. Max KollerM) Lynch, Shatara Primary Care Kendyll Huettner: Renford DillsPolite, Ronald Other Clinician: Referring Dmiya Malphrus: Treating Suzan Manon/Extender: Gwyneth Revelsobson, Michael [name],  Ronald Weeks in Treatment: 21 Active Inactive Nutrition Nursing Diagnoses: Imbalanced nutrition Potential for alteratiion in Nutrition/Potential for imbalanced nutrition Goals: Patient/caregiver agrees to and verbalizes understanding of need to use nutritional supplements and/or vitamins as prescribed Date Initiated: 10/24/2019 Target Resolution Date: 04/03/2020 Goal Status: Active Patient/caregiver will maintain therapeutic glucose control Date Initiated: 10/24/2019 Target Resolution Date: 04/03/2020 Goal Status: Active Interventions: Assess HgA1c results as ordered upon admission and as needed Assess patient nutrition upon admission and as needed per policy Provide education on elevated blood sugars and impact on wound healing Provide education on nutrition Treatment Activities: Education provided on Nutrition : 11/01/2019 Notes: Wound/Skin Impairment Nursing Diagnoses: Impaired tissue integrity Knowledge deficit related to ulceration/compromised skin integrity Goals: Patient/caregiver will verbalize understanding of skin care regimen Date Initiated: 10/24/2019 Target Resolution Date: 04/03/2020 Goal Status: Active Ulcer/skin breakdown will have a volume reduction of 30% by week 4 Date Initiated: 10/24/2019 Date Inactivated: 01/16/2020 Target Resolution Date: 01/10/2020 Unmet Reason: no change in Goal Status: Unmet measurements. Interventions: Assess patient/caregiver ability to obtain necessary supplies Assess patient/caregiver ability to perform ulcer/skin care regimen upon admission and as needed Assess ulceration(s) every visit Provide education on ulcer and skin care Notes: Electronic Signature(s) Signed: 03/20/2020 5:46:53 PM By: Zandra AbtsLynch, Shatara RN, BSN Signed: 03/20/2020 5:46:53 PM By: Zandra AbtsLynch, Shatara RN, BSN  Entered By: Zandra Abts on 03/20/2020 17:46:12 -------------------------------------------------------------------------------- Pain Assessment  Details Patient Name: Date of Service: [name] 03/20/2020 3:15 PM Medical Record Number: [id number] Patient Account Number: [id number] Date of Birth/Sex: Treating RN: 1985/10/11 (34 y.o. [name]) Yevonne Pax Primary Care Wynema Garoutte: [name] Other Clinician: Referring Arlyne Brandes: Treating Maika Mcelveen/Extender: Gwyneth Revels in Treatment: 21 Active Problems Location of Pain Severity and Description of Pain Patient Has Paino No Site Locations Pain Management and Medication Current Pain Management: Electronic Signature(s) Signed: 03/20/2020 5:23:39 PM By: Yevonne Pax RN Entered By: Yevonne Pax on 03/20/2020 16:05:08 -------------------------------------------------------------------------------- Patient/Caregiver Education Details Patient Name: Date of Service: [name] 12/17/2021andnbsp3:15 PM Medical Record Number: [id number] Patient Account Number: [id number] Date of Birth/Gender: Treating RN: 07-20-1985 (34 y.o. [name] Primary Care Physician: [name] Other Clinician: Referring Physician: Treating Physician/Extender: Gwyneth Revels in Treatment: 21 Education Assessment Education Provided To: Patient Education Topics Provided Wound/Skin Impairment: Methods: Explain/Verbal Responses: State content correctly [employer]) Signed: 03/20/2020 5:46:53 PM By: Zandra Abts RN, BSN Entered By: Zandra Abts on 03/20/2020 17:46:21 -------------------------------------------------------------------------------- Wound Assessment Details Patient Name: Date of Service: [name], [name] 03/20/2020 3:15 PM Medical Record Number: [id number] Patient Account Number: [id number] Date of Birth/Sex: Treating RN: 1985/10/05 (34 y.o. [name]) Yevonne Pax Primary Care Kimberlin Scheel: [name] Other Clinician: Referring Phenix Grein: Treating Ashyr Hedgepath/Extender: Gwyneth Revels in  Treatment: 21 Wound Status Wound Number: 3 Primary Etiology: Diabetic Wound/Ulcer of the Lower Extremity Wound Location: Left, Lateral Foot Wound Status: Open Wounding Event: Trauma Comorbid History: Type II Diabetes Date Acquired: 10/02/2019 Weeks Of Treatment: 21 Clustered Wound: No Wound Measurements Length: (cm) 2.7 Width: (cm) 3.3 Depth: (cm) 1 Area: (cm) 6.998 Volume: (cm) 6.998 % Reduction in Area: -324.4% % Reduction in Volume: -4141.2% Epithelialization: None Tunneling: No Undermining: No Wound Description Classification: Grade 2 Wound Margin: Well defined, not attached Exudate Amount: Large Exudate Type: Serosanguineous Exudate Color: red, brown Foul Odor After Cleansing: No Slough/Fibrino Yes Wound Bed Granulation Amount: Small (1-33%) Exposed Structure Granulation Quality: Red, Pink, Hyper-granulation Fascia Exposed: No Necrotic Amount: Large (67-100%) Fat Layer (Subcutaneous Tissue) Exposed: Yes Necrotic Quality: Adherent Slough Tendon Exposed: No Muscle Exposed: No Joint Exposed: No Bone Exposed: No Treatment Notes Wound #3 (Foot) Wound Laterality: Left, Lateral Cleanser Peri-Wound Care Sween Lotion (Moisturizing lotion) Discharge Instruction: Apply moisturizing lotion as directed Topical Primary Dressing Hydrofera Blue Classic Foam, 2x2 in Discharge Instruction: Apply to wound bed as instructed Secondary Dressing Woven Gauze Sponge, Non-Sterile 4x4 in Discharge Instruction: Apply over primary dressing as directed. ABD Pad, 5x9 Discharge Instruction: Apply over primary dressing as directed. Felt 2.5 yds x 5.5 in Discharge Instruction: Apply over primary dressing as callous pad Secured With Compression Wrap FourPress (4 layer compression wrap) Discharge Instruction: Apply four layer compression as directed. Compression Stockings Add-Ons Electronic Signature(s) Signed: 03/20/2020 5:23:39 PM By: Yevonne Pax RN Entered By: Yevonne Pax on  03/20/2020 16:07:13 -------------------------------------------------------------------------------- Wound Assessment Details Patient Name: Date of Service: [name] 03/20/2020 3:15 PM Medical Record Number: [id number] Patient Account Number: [id number] Date of Birth/Sex: Treating RN: 02/21/1986 (34 y.o. [name] Primary Care Korynne Dols: [name] Other Clinician: Referring Unique Searfoss: Treating Hamdi Vari/Extender: Gwyneth Revels in Treatment: 21 Wound Status Wound Number: 4 Primary Etiology: Diabetic Wound/Ulcer of the Lower Extremity Wound Location: Right, Lateral Foot Wound Status: Open Wounding Event: Gradually Appeared Comorbid History: Type II Diabetes Date Acquired: 02/06/2020 Weeks Of  Treatment: 5 Clustered Wound: No Wound Measurements Length: (cm) 0.3 Width: (cm) 0.5 Depth: (cm) 0.1 Area: (cm) 0.118 Volume: (cm) 0.012 % Reduction in Area: 87.2% % Reduction in Volume: 87% Epithelialization: Small (1-33%) Tunneling: No Undermining: No Wound Description Classification: Grade 2 Wound Margin: Distinct, outline attached Exudate Amount: Small Exudate Type: Serosanguineous Exudate Color: red, brown Foul Odor After Cleansing: No Slough/Fibrino No Wound Bed Granulation Amount: Large (67-100%) Exposed Structure Granulation Quality: Red Fascia Exposed: No Necrotic Amount: None Present (0%) Fat Layer (Subcutaneous Tissue) Exposed: Yes Tendon Exposed: No Muscle Exposed: No Joint Exposed: No Bone Exposed: No Treatment Notes Wound #4 (Foot) Wound Laterality: Right, Lateral Cleanser Peri-Wound Care Sween Lotion (Moisturizing lotion) Discharge Instruction: Apply moisturizing lotion as directed Topical Primary Dressing Hydrofera Blue Classic Foam, 2x2 in Discharge Instruction: Apply to wound bed as instructed Secondary Dressing Woven Gauze Sponges 2x2 in Discharge Instruction: Apply over primary dressing as directed. Felt  2.5 yds x 5.5 in Discharge Instruction: Apply over primary dressing as callous pad Secured With Compression Wrap FourPress (4 layer compression wrap) Discharge Instruction: Apply four layer compression as directed. Compression Stockings Add-Ons Electronic Signature(s) Signed: 03/20/2020 5:23:39 PM By: Yevonne Pax RN Entered By: Yevonne Pax on 03/20/2020 16:07:21 -------------------------------------------------------------------------------- Vitals Details Patient Name: Date of Service: [name], [name] 03/20/2020 3:15 PM Medical Record Number: [id number] Patient Account Number: [id number] Date of Birth/Sex: Treating RN: March 18, 1986 (34 y.o. [name]) Yevonne Pax Primary Care Jayjay Littles: [name] Other Clinician: Referring Endy Easterly: Treating Landi Biscardi/Extender: Gwyneth Revels in Treatment: 21 Vital Signs Time Taken: 16:04 Temperature (F): 98.7 Height (in): 77 Pulse (bpm): 101 Weight (lbs): 280 Respiratory Rate (breaths/min): 18 Body Mass Index (BMI): 33.2 Blood Pressure (mmHg): 137/85 Reference Range: 80 - 120 mg / dl Electronic Signature(s) Signed: 03/20/2020 5:23:39 PM By: Yevonne Pax RN Entered By: Yevonne Pax on 03/20/2020 16:05:00

## 2020-03-23 NOTE — Progress Notes (Signed)
[name], [name] ([id number]) Visit Report for 03/20/2020 HPI Details Patient Name: Date of Service: [name] 03/20/2020 3:15 PM Medical Record Number: [id number] Patient Account Number: [id number] Date of Birth/Sex: Treating RN: [birth year] (34 y.o. 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 He comes in for review of a wound over the plantar fifth metatarsal head extending into the lateral part of the foot. He was followed for this previously by his podiatrist Dr. Cornelius Moras. As the patient tells his story he went to see podiatry first for a swelling he developed on the lateral part of his fifth metatarsal head in May. He states this was "open" by podiatry and the area closed. He was followed up in June and it was again opened callus removed and it closed promptly. There were plans being made for surgery on the fifth metatarsal head in June however his blood sugar was apparently too high for anesthesia. Apparently the area was debrided and opened again in June and it is never closed since. Looking over the records from podiatry I am really not able to follow this. It was clear when he was first seen it was before 5/14 at that point he already had a wound. By 5/17 the ulcer was resolved. I do not see anything about a procedure. On 5/28 noted to have pre-ulcerative moderate keratosis. X-ray noted 1/5 contracted toe and tailor's bunion and metatarsal deformity. On a visit date on 09/28/2018 the dorsal part of the left foot it healed and resolved. There was concern about swelling in his lower extremity he was sent to the ER.. As far as I can tell he was seen in the ER on 7/12 with an ulcer on his left foot. A DVT rule  out of the left leg was negative. I do not think I have complete records from podiatry but I am not able to verify the procedures this patient states he had. He states after the last procedure the wound has never closed although I am not able to follow this in the records I have from podiatry. He has not had a recent x-ray The patient has been using Neosporin on the wound. He is wearing a Darco shoe. He is still very active up on his foot working and exercising. Past medical history; type 2 diabetes ketosis-prone, leg swelling with a negative DVT study in July. Non-smoker ABI in our clinic was 0.85 on the left 10/16; substantial wound on the plantar left fifth met head extending laterally almost to the dorsal fifth MTP. We have been using silver alginate we gave him a Darco forefoot off loader. An x-ray did not show evidence of osteomyelitis did note soft tissue emphysema which I think was due to gas tracking through an open wound. There is no doubt in my mind he requires an MRI 10/23; MRI not booked until 3 November at the earliest this is largely due to his glucose sensor in the right arm. We have been using silver alginate. There has been an improvement 10/29; I am still not exactly sure when his MRI is booked for. He says it is the third but it is the 10th in epic. This definitely needs to be done. He is running a low-grade fever today but no other symptoms. No real improvement  in the 1 33/24/2020 patient presents today for a follow-up visit here in our clinic he is last been seen in the clinic on October 29. Subsequently we were working on getting MRI to evaluate and see what exactly was going on and where we would need to go from the standpoint of whether or not he had osteomyelitis and again what treatments were going be required. Subsequently the patient ended up being admitted to the hospital on 33/08/2018 and was discharged on 02/14/2019. This is a somewhat interesting admission with a  discharge diagnosis of pneumonia due to COVID-19 although he was positive for COVID-19 when tested at the urgent care but negative x2 when he was actually in the hospital. With that being said he did have acute respiratory failure with hypoxia and it was noted he also have a left foot ulceration with osteomyelitis. With that being said he did require oxygen for his pneumonia and I level 4 L. He was placed on antivirals and steroids for the COVID-19. He was also transferred to the [facility] at one point. Nonetheless he did subsequently discharged home and since being home has done much better in that regard. The CT angiogram did not show any pulmonary embolism. With regard to the osteomyelitis the patient was placed on vancomycin and Zosyn while in the hospital but has been changed to Augmentin at discharge. It was also recommended that he follow- up with wound care and podiatry. Podiatry however wanted him to see [name] according to the patient prior to them doing anything further. His hemoglobin A1c was 9.9 as noted in the hospital. Have an MRI of the left foot performed while in the hospital on 02/04/2019. 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 There is an overlying plantar open wound noted an abscess tracking back along the lateral aspect of the fifth metatarsal shaft. There is otherwise diffuse cellulitis and mild fasciitis without findings of polymyositis. The patient did have recently pneumonia secondary to COVID-19 I looked in the chart through epic and it does appear that the patient may need to have an additional x-ray just to ensure everything is cleared and that he has no airspace disease prior to putting him into the [name]. 03/05/2019; patient was readmitted to the clinic last week. He was hospitalized twice for a viral upper respiratory tract infection from 11/1 through 11/4 and  then 11/5 through 11/12 ultimately this turned out to be Covid pneumonitis. Although he was discharged on oxygen he is not using it. He says he feels fine. He has no exercise limitation no cough no sputum. His O2 sat in our clinic today was 100% on room air. 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 He received Vanco and Zosyn in the hospital and then was discharged on 2 weeks of Augmentin. I do not see any relevant cultures. He was supposed to follow-up with infectious disease but I do not see that he has an appointment. 12/8; patient saw Dr. Novella Olive of infectious disease last week. He felt that he had had adequate antibiotic therapy. He did not go to follow-up with Dr. Amalia Hailey of podiatry and I have again talked to him about the pros and cons of this. He does not want to consider a ray amputation of this time. He is aware of the risks of  recurrence, migration etc. He started HBO today and tolerated this well. He can complete the Augmentin that I gave him last week. I have looked over the lab work that Dr. Chana Bode ordered his C-reactive protein was 3.3 and his sedimentation rate was 17. The C-reactive protein is never really been measurably that high in this patient 12/15; not much change in the wound today however he has undermining along the lateral part of the foot again more extensively than last week. He has some rims of epithelialization. We have been using silver alginate. He is undergoing hyperbarics but did not dive today 12/18; in for his obligatory first total contact cast change. Unfortunately there was pus coming from the undermining area around his fifth metatarsal head. This was cultured but will preclude reapplication of a cast. He is seen in conjunction with HBO 12/24; patient had staph lugdunensis in the wound in the undermining area laterally last time. We put him on  doxycycline which should have covered this. The wound looks better today. I am going to give him another week of doxycycline before reattempting the total contact cast 12/31; the patient is completing antibiotics. Hemorrhagic debris in the distal part of the wound with some undermining distally. He also had hyper granulation. Extensive debridement with a #5 curette. The infected area that was on the lateral part of the fifth met head is closed over. I do not think he needs any more antibiotics. Patient was seen prior to HBO. Preparations for a total contact cast were made in the cast will be placed post hyperbarics 04/11/19; once again the patient arrives today without complaint. He had been in a cast all week noted that he had heavy drainage this week. This resulted in large raised areas of macerated tissue around the wound 1/14; wound bed looks better slightly smaller. Hydrofera Blue has been changing himself. He had a heavy drainage last week which caused a lot of maceration around the wound so I took him out of a total contact cast he says the drainage is actually better this week He is seen today in conjunction with HBO 1/21; returns to clinic. He was up in [place] for a day or 2 attending a funeral. He comes back in with the wound larger and with a large area of exposed bone. He had osteomyelitis and septic arthritis of the fifth left metatarsal head while he was in hospital. He received IV antibiotics in the hospital for a prolonged period of time then 3 weeks of Augmentin. Subsequently I gave him 2 weeks of doxycycline for more superficial wound infection. When I saw this last week the wound was smaller the surface of the wound looks satisfactory. 1/28; patient missed hyperbarics today. Bone biopsy I did last time showed Enterococcus faecalis and Staphylococcus lugdunensis . He has a wide area of exposed bone. We are going to use silver alginate as of today. I had another ethical discussion  with the patient. 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 In this situation I think the likelihood of healing this is low. Therefore I have recommended a ray amputation and with the patient's agreement I have referred him to Dr. Doran Durand. The other issue is that his compliance with hyperbarics has been minimal because of his work schedule and given his underlying decision I am going to stop this today READMISSION 10/24/2019 MRI 09/29/2019 left foot IMPRESSION: 1. Apparent skin ulceration inferior and lateral to the 5th metatarsal base with underlying heterogeneous T2 signal  and enhancement in the subcutaneous fat. Small peripherally enhancing fluid collections along the plantar and lateral aspects of the 5th metatarsal base suspicious for abscesses. 2. Interval amputation through the mid 5th metatarsal with nonspecific low-level marrow edema and enhancement. Given the proximity to the adjacent soft tissue inflammatory changes, osteomyelitis cannot be excluded. 3. The additional bones appear unremarkable. MRI 09/29/2019 right foot IMPRESSION: 1. Soft tissue ulceration lateral to the 5th MTP joint. There is low-level T2 hyperintensity within the 4th and 5th metatarsal heads and adjacent proximal phalanges without abnormal T1 signal or cortical destruction. These findings are nonspecific and could be seen with early marrow edema, hyperemia or early osteomyelitis. No evidence of septic joint. 2. Mild tenosynovitis and synovial enhancement associated with the extensor digitorum tendons at the level of the midfoot. 3. Diffuse low-level muscular T2 hyperintensity and enhancement, most consistent with diabetic myopathy. LEFT FOOT BONE Methicillin resistant staphylococcus aureus Staphylococcus lugdunensis MIC MIC CIPROFLOXACIN >=8 RESISTANT Resistant <=0.5 SENSI... Sensitive CLINDAMYCIN <=0.25 SENS... Sensitive >=8 RESISTANT Resistant ERYTHROMYCIN >=8  RESISTANT Resistant >=8 RESISTANT Resistant GENTAMICIN <=0.5 SENSI... Sensitive <=0.5 SENSI... Sensitive Inducible Clindamycin NEGATIVE Sensitive NEGATIVE Sensitive OXACILLIN >=4 RESISTANT Resistant 2 SENSITIVE Sensitive RIFAMPIN <=0.5 SENSI... Sensitive <=0.5 SENSI... Sensitive TETRACYCLINE <=1 SENSITIVE Sensitive <=1 SENSITIVE Sensitive TRIMETH/SULFA <=10 SENSIT Sensitive <=10 SENSIT Sensitive ... Marland Kitchen.. VANCOMYCIN 1 SENSITIVE Sensitive <=0.5 SENSI... Sensitive Right foot bone . Component 3 wk ago Specimen Description BONE Special Requests RIGHT 4 METATARSAL SAMPLE B Gram Stain NO WBC SEEN NO ORGANISMS SEEN Culture RARE METHICILLIN RESISTANT STAPHYLOCOCCUS AUREUS NO ANAEROBES ISOLATED Performed at [facility], [place] [address], [place], [place] [address] Report Status 10/08/2019 FINAL Organism ID, Bacteria METHICILLIN RESISTANT STAPHYLOCOCCUS AUREUS Resulting Agency [facility] Susceptibility Methicillin resistant staphylococcus aureus MIC CIPROFLOXACIN >=8 RESISTANT Resistant CLINDAMYCIN <=0.25 SENS... Sensitive ERYTHROMYCIN >=8 RESISTANT Resistant GENTAMICIN <=0.5 SENSI... Sensitive Inducible Clindamycin NEGATIVE Sensitive OXACILLIN >=4 RESISTANT Resistant RIFAMPIN <=0.5 SENSI... Sensitive TETRACYCLINE <=1 SENSITIVE Sensitive TRIMETH/SULFA <=10 SENSIT Sensitive ... VANCOMYCIN 1 SENSITIVE Sensitive This is a patient we had in clinic earlier this year with a wound over his left fifth metatarsal head. 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 Doran Durand on 05/16/2019. He seems to have developed problems with wounds on his bilateral feet in June 2021 although he may have had problems earlier than  this. He was in an urgent care with a right foot ulcer on 09/26/2019 and given a course of doxycycline. This was apparently after having trouble getting into see orthopedics. He was seen by podiatry on 09/28/2019 noted to have bilateral lower extremity ulcers including the left lateral fifth metatarsal base and the right subfifth met head. It was noted that had purulent drainage at that time. He required hospitalization from 6/20 through 7/2. This was because of worsening right foot wounds. He underwent bilateral operative incision and drainage and bone biopsies bilaterally. Culture results are listed above. He has been referred back to clinic by Dr. Jacqualyn Posey of podiatry. He is also followed by Dr. Megan Salon who saw him yesterday. He was discharged from hospital on Zyvox Flagyl and Levaquin and yesterday changed to doxycycline Flagyl and Levaquin. His inflammatory markers on 6/26 showed a sedimentation rate of 129 and a C-reactive protein of 5. This is  improved to 14 and 1.3 respectively. This would indicate improvement. ABIs in our clinic today were 1.23 on the right and 1.20 on the left 11/01/2019 on evaluation today patient appears to be doing fairly well in regard to the wounds on his feet at this point. Fortunately there is no signs of active infection at this time. No fevers, chills, nausea, vomiting, or diarrhea. He currently is seeing infectious disease and still under their care at this point. Subsequently he also has both wounds which she has not been using collagen on as he did not receive that in his packaging he did not call us and let us know that. Apparently that just was missed on the order. Nonetheless we will get that straightened out today. 8/9-Patient returns for bilateral foot wounds, using Prisma with hydrogel moistened dressings, and the wounds appear stable. Patient using surgical shoes, avoiding much pressure or weightbearing as much as possible 8/16; patient has bilateral foot  wounds. 1 on the right lateral foot proximally the other is on the left mid lateral foot. Both required debridement of callus and thick skin around the wounds. We have been using silver collagen 8/27; patient has bilateral lateral foot wounds. The area on the left substantially surrounded by callus and dry skin. This was removed from the wound edge. The underlying wound is small. The area on the right measured somewhat smaller today. 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 Unfortunately I did not update his antibiotics during today's visit. 9/10 I reviewed Dr. Hale Bogus last notes he felt he had completed antibiotics his inflammatory markers were reasonably well controlled. He has a small wound on the lateral left foot and a tiny area on the right which is just above closed. He is using Hydrofera Blue with border foam he has bilateral surgical shoes 9/24; 2 week f/u. doing well. right foot is closed. left foot still undermined. 10/14; right foot remains closed at the fifth met head. The area over the base of the left fifth metatarsal has a small open area but considerable undermining towards the plantar foot. Thick callus skin around this suggests an adequate pressure relief. We have talked about this. He says he is going to go back into his cam boot. I suggested a total contact cast he did not seem enamored with this suggestion 10/26; left foot base of the fifth metatarsal. Same condition as last time. He has skin over the area with an open wound however the skin is not adherent. He went to see Dr. Earleen Newport who did an x-ray and culture of his foot I have not reviewed the x-ray but the patient was not told anything. He is on doxycycline 11/11; since the patient was last here he was in the emergency room on 10/30 he was concerned about swelling in the left foot. They did not do any cultures or x-rays. They changed his antibiotics to  cephalexin. Previous culture showed group B strep. The cephalexin is appropriate as doxycycline has less than predictable coverage. Arrives in clinic today with swelling over this area under the wound. He also has a new wound on the right fifth metatarsal head 11/18; the patient has a difficult wound on the lateral aspect of the left fifth metatarsal head. The wound was almost ballotable last week I opened it slightly expecting to see purulence however there was just bleeding. I cultured this this was negative. X-ray unchanged. We are trying to get an MRI  but I am not sure were going to be able to get this through his insurance. He also has an area on the right lateral fifth metatarsal head this looks healthier 12/3; the patient finally got our MRI. Surprisingly this did not show osteomyelitis. I did show the soft tissue ulceration at the lateral plantar aspect of the fifth metatarsal base with a tiny residual 6 mm abscess overlying the superficial fascia I have tried to culture this area I have not been able to get this to grow anything. Nevertheless the protruding tissue looks aggravated. I suspect we should try to treat the underlying "abscess with broad-spectrum antibiotics. I am going to start him on Levaquin and Flagyl. He has much less edema in his legs and I am going to continue to wrap his legs and see him weekly 12/10. I started Levaquin and Flagyl on him last week. He just picked up the Flagyl apparently there was some delay. The worry is the wound on the left fifth metatarsal base which is substantial and worsening. His foot looks like he inverts at the ankle making this a weightbearing surface. Certainly no improvement in fact I think the measurements of this are somewhat worse. We have been using 12/17; he apparently just got the Levaquin yesterday this is 2 weeks after the fact. He has completed the Flagyl. The area over the left fifth metatarsal base still has protruding granulation  tissue although it does not look quite as bad as it did some weeks ago. He has severe bilateral lymphedema although we have not been treating him for wounds on his legs this is definitely going to require compression. There was so much edema in the left I did not wish to put him in a total contact cast today. I am going to increase his compression from 3-4 layer. The area on the right lateral fifth met head actually look quite good and superficial. Electronic Signature(s) Signed: 03/20/2020 4:58:57 PM By: [name] MD Entered By: [name] on 03/20/2020 16:52:37 -------------------------------------------------------------------------------- Physical Exam Details Patient Name: Date of Service: [name], [name] 03/20/2020 3:15 PM Medical Record Number: [id number] Patient Account Number: [id number] Date of Birth/Sex: Treating RN: [birth year] (33 y.o. [name] Primary Care Provider: [name] Other Clinician: Referring Provider: Treating Provider/Extender: [name] in Treatment: 21 Constitutional Sitting or standing Blood Pressure is within target range for patient.. Pulse regular and within target range for patient.Marland Kitchen Respirations regular, non-labored and within target range.. Temperature is normal and within the target range for the patient.Marland Kitchen Appears in no distress. Notes Wound exam; the left lateral fifth metatarsal base wound looks somewhat better. The tissue looks less bulging and less angry. The tissue actually looks better. However there is no improvement in size. He has not yet started the Levaquin He has massive bilateral lymphedema. This is not well controlled in 3 layer compression Electronic Signature(s) Signed: 03/20/2020 4:58:57 PM By: [name] MD Entered By: [name] on 03/20/2020 16:54:07 -------------------------------------------------------------------------------- Physician Orders Details Patient Name:  Date of Service: [name], [name] 03/20/2020 3:15 PM Medical Record Number: [id number] Patient Account Number: [id number] Date of Birth/Sex: Treating RN: 16-Nov-1985 (34 y.o. [name] Primary Care Provider: [name] Other Clinician: Referring Provider: Treating Provider/Extender: [name] in Treatment: 21 Verbal / Phone Orders: No Diagnosis Coding ICD-10 Coding Code Description E11.621 Type 2 diabetes mellitus with foot ulcer L97.528 Non-pressure chronic ulcer of other part of left foot with other  specified severity L97.518 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ity Follow-up Appointments ppointment in 1 week. - Thursday 12/23 Return A Bathing/ Shower/ Hygiene May shower with protection but do not get wound dressing(s) wet. - may use cast protectors Edema Control - Lymphedema / SCD / Other Bilateral Lower Extremities Elevate legs to the level of the heart or above for 30 minutes daily and/or when sitting, a frequency of: Avoid standing for long periods of time. Exercise regularly Off-Loading Open toe surgical shoe to: - both feet, pad left shoe with felt to offload lateral foot Wound Treatment Wound #3 - Foot Wound Laterality: Left, Lateral Peri-Wound Care: Sween Lotion (Moisturizing lotion) 1 x Per Week Discharge Instructions: Apply moisturizing lotion as directed Prim Dressing: Hydrofera Blue Classic Foam, 2x2 in 1 x Per Week ary Discharge Instructions: Apply to wound bed as instructed Secondary Dressing: Woven Gauze Sponge, Non-Sterile 4x4 in 1 x Per Week Discharge Instructions: Apply over primary dressing as directed. Secondary Dressing: ABD Pad, 5x9 1 x Per Week Discharge Instructions: Apply over primary dressing as directed. Secondary Dressing: Felt 2.5 yds x 5.5 in 1 x Per Week Discharge Instructions: Apply over primary dressing as callous pad Compression Wrap: FourPress (4 layer  compression wrap) 1 x Per Week Discharge Instructions: Apply four layer compression as directed. Wound #4 - Foot Wound Laterality: Right, Lateral Peri-Wound Care: Sween Lotion (Moisturizing lotion) Discharge Instructions: Apply moisturizing lotion as directed Prim Dressing: Hydrofera Blue Classic Foam, 2x2 in ary Discharge Instructions: Apply to wound bed as instructed Secondary Dressing: Woven Gauze Sponges 2x2 in Discharge Instructions: Apply over primary dressing as directed. Secondary Dressing: Felt 2.5 yds x 5.5 in Discharge Instructions: Apply over primary dressing as callous pad Compression Wrap: FourPress (4 layer compression wrap) Discharge Instructions: Apply four layer compression as directed. Electronic Signature(s) Signed: 03/20/2020 4:58:57 PM By: [name] MD Signed: 03/20/2020 5:46:53 PM By: [name] RN, BSN Entered By: [name] on 03/20/2020 16:33:05 -------------------------------------------------------------------------------- Problem List Details Patient Name: Date of Service: [name], [name] 03/20/2020 3:15 PM Medical Record Number: [id number] Patient Account Number: [id number] Date of Birth/Sex: Treating RN: [birth year] (33 y.o. 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, right ankle and foot 10/24/2019 10/24/2019 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o. 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 He comes in for review of  a wound over the plantar fifth metatarsal head extending into the lateral part of the foot. He was followed for this previously by his podiatrist Dr. Cornelius Moras. As the patient tells his story he went to see podiatry first for a swelling he developed on the lateral part of his fifth metatarsal head in May. He states this was "open" by podiatry and the area closed. He was followed up in June and it was again opened callus removed and it closed promptly. There were plans being made for surgery on the fifth metatarsal head in June however his blood sugar was apparently too high for anesthesia. Apparently the area was debrided and opened again in June and it is never closed since. Looking over the records from podiatry I am really not able to follow this. It was clear when he was first seen it was before 5/14 at that point he already had a wound. By 5/17 the ulcer was resolved. I do not see anything about a procedure. On 5/28 noted  to have pre-ulcerative moderate keratosis. X-ray noted 1/5 contracted toe and tailor's bunion and metatarsal deformity. On a visit date on 09/28/2018 the dorsal part of the left foot it healed and resolved. There was concern about swelling in his lower extremity he was sent to the ER.. As far as I can tell he was seen in the ER on 7/12 with an ulcer on his left foot. A DVT rule out of the left leg was negative. I do not think I have complete records from podiatry but I am not able to verify the procedures this patient states he had. He states after the last procedure the wound has never closed although I am not able to follow this in the records I have from podiatry. He has not had a recent x-ray The patient has been using Neosporin on the wound. He is wearing a Darco shoe. He is still very active up on his foot working and exercising. Past medical history; type 2 diabetes ketosis-prone, leg swelling with a negative DVT study in July. Non-smoker ABI in our clinic was 0.85 on the left 10/16; substantial wound on the plantar left fifth met head extending laterally almost to the dorsal fifth MTP. We have been using silver alginate we gave him a Darco forefoot off loader. An x-ray did not show evidence of osteomyelitis did note soft tissue emphysema which I think was due to gas tracking through an open wound. There is no doubt in my mind he requires an MRI 10/23; MRI not booked until 3 November at the earliest this is largely due to his glucose sensor in the right arm. We have been using silver alginate. There has been an improvement 10/29; I am still not exactly sure when his MRI is booked for. He says it is the third but it is the 10th in epic. This definitely needs to be done. He is running a low-grade fever today but no other symptoms. No real improvement in the 1 33/24/2020 patient presents today for a follow-up visit here in our clinic he is last been seen in the clinic on October 29. Subsequently  we were working on getting MRI to evaluate and see what exactly was going on and where we would need to go from the standpoint of whether or not he had osteomyelitis and again what treatments were going be required. Subsequently the patient ended up being admitted to the hospital on 33/08/2018 and was discharged on 02/14/2019. This is a somewhat  interesting admission with a discharge diagnosis of pneumonia due to COVID-19 although he was positive for COVID-19 when tested at the urgent care but negative x2 when he was actually in the hospital. With that being said he did have acute respiratory failure with hypoxia and it was noted he also have a left foot ulceration with osteomyelitis. With that being said he did require oxygen for his pneumonia and I level 4 L. He was placed on antivirals and steroids for the COVID-19. He was also transferred to the [facility] at one point. Nonetheless he did subsequently discharged home and since being home has done much better in that regard. The CT angiogram did not show any pulmonary embolism. With regard to the osteomyelitis the patient was placed on vancomycin and Zosyn while in the hospital but has been changed to Augmentin at discharge. It was also recommended that he follow- up with wound care and podiatry. Podiatry however wanted him to see [name] according to the patient prior to them doing anything further. His hemoglobin A1c was 9.9 as noted in the hospital. Have an MRI of the left foot performed while in the hospital on 02/04/2019. 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 There is an overlying plantar open wound noted an abscess tracking back along the lateral aspect of the fifth metatarsal shaft. There is otherwise diffuse cellulitis and mild fasciitis without findings of polymyositis. The patient did have recently pneumonia secondary to COVID-19 I looked  in the chart through epic and it does appear that the patient may need to have an additional x-ray just to ensure everything is cleared and that he has no airspace disease prior to putting him into the [name]. 03/05/2019; patient was readmitted to the clinic last week. He was hospitalized twice for a viral upper respiratory tract infection from 11/1 through 11/4 and then 11/5 through 11/12 ultimately this turned out to be Covid pneumonitis. Although he was discharged on oxygen he is not using it. He says he feels fine. He has no exercise limitation no cough no sputum. His O2 sat in our clinic today was 100% on room air. 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 He received Vanco and Zosyn in the hospital and then was discharged on 2 weeks of Augmentin. I do not see any relevant cultures. He was supposed to follow-up with infectious disease but I do not see that he has an appointment. 12/8; patient saw Dr. Novella Olive of infectious disease last week. He felt that he had had adequate antibiotic therapy. He did not go to follow-up with Dr. Amalia Hailey of podiatry and I have again talked to him about the pros and cons of this. He does not want to consider a ray amputation of this time. He is aware of the risks of recurrence, migration etc. He started HBO today and tolerated this well. He can complete the Augmentin that I gave him last week. I have looked over the lab work that Dr. Chana Bode ordered his C-reactive protein was 3.3 and his sedimentation rate was 17. The C-reactive protein is never really been measurably that high in this patient 12/15; not much change in the wound today however he has undermining along the lateral part of the foot again more extensively than last week. He has some rims of epithelialization. We  have been using silver alginate. He is undergoing hyperbarics but did not dive  today 12/18; in for his obligatory first total contact cast change. Unfortunately there was pus coming from the undermining area around his fifth metatarsal head. This was cultured but will preclude reapplication of a cast. He is seen in conjunction with HBO 12/24; patient had staph lugdunensis in the wound in the undermining area laterally last time. We put him on doxycycline which should have covered this. The wound looks better today. I am going to give him another week of doxycycline before reattempting the total contact cast 12/31; the patient is completing antibiotics. Hemorrhagic debris in the distal part of the wound with some undermining distally. He also had hyper granulation. Extensive debridement with a #5 curette. The infected area that was on the lateral part of the fifth met head is closed over. I do not think he needs any more antibiotics. Patient was seen prior to HBO. Preparations for a total contact cast were made in the cast will be placed post hyperbarics 04/11/19; once again the patient arrives today without complaint. He had been in a cast all week noted that he had heavy drainage this week. This resulted in large raised areas of macerated tissue around the wound 1/14; wound bed looks better slightly smaller. Hydrofera Blue has been changing himself. He had a heavy drainage last week which caused a lot of maceration around the wound so I took him out of a total contact cast he says the drainage is actually better this week He is seen today in conjunction with HBO 1/21; returns to clinic. He was up in [place] for a day or 2 attending a funeral. He comes back in with the wound larger and with a large area of exposed bone. He had osteomyelitis and septic arthritis of the fifth left metatarsal head while he was in hospital. He received IV antibiotics in the hospital for a prolonged period of time then 3 weeks of Augmentin. Subsequently I gave him 2 weeks of doxycycline for more  superficial wound infection. When I saw this last week the wound was smaller the surface of the wound looks satisfactory. 1/28; patient missed hyperbarics today. Bone biopsy I did last time showed Enterococcus faecalis and Staphylococcus lugdunensis . He has a wide area of exposed bone. We are going to use silver alginate as of today. I had another ethical discussion with the patient. 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 In this situation I think the likelihood of healing this is low. Therefore I have recommended a ray amputation and with the patient's agreement I have referred him to Dr. Doran Durand. The other issue is that his compliance with hyperbarics has been minimal because of his work schedule and given his underlying decision I am going to stop this today READMISSION 10/24/2019 MRI 09/29/2019 left foot IMPRESSION: 1. Apparent skin ulceration inferior and lateral to the 5th metatarsal base with underlying heterogeneous T2 signal and enhancement in the subcutaneous fat. Small peripherally enhancing fluid collections along the plantar and lateral aspects of the 5th metatarsal base suspicious for abscesses. 2. Interval amputation through the mid 5th metatarsal with nonspecific low-level marrow edema and enhancement. Given the proximity to the adjacent soft tissue inflammatory changes, osteomyelitis cannot be excluded. 3. The additional bones appear unremarkable. MRI 09/29/2019 right foot IMPRESSION: 1. Soft tissue ulceration lateral to the 5th MTP joint. There is low-level T2 hyperintensity within the 4th and 5th metatarsal heads and adjacent  proximal phalanges without abnormal T1 signal or cortical destruction. These findings are nonspecific and could be seen with early marrow edema, hyperemia or early osteomyelitis. No evidence of septic joint. 2. Mild tenosynovitis and synovial enhancement associated with the extensor digitorum tendons at the  level of the midfoot. 3. Diffuse low-level muscular T2 hyperintensity and enhancement, most consistent with diabetic myopathy. LEFT FOOT BONE Methicillin resistant staphylococcus aureus Staphylococcus lugdunensis MIC MIC CIPROFLOXACIN >=8 RESISTANT Resistant <=0.5 SENSI... Sensitive CLINDAMYCIN <=0.25 SENS... Sensitive >=8 RESISTANT Resistant ERYTHROMYCIN >=8 RESISTANT Resistant >=8 RESISTANT Resistant GENTAMICIN <=0.5 SENSI... Sensitive <=0.5 SENSI... Sensitive Inducible Clindamycin NEGATIVE Sensitive NEGATIVE Sensitive OXACILLIN >=4 RESISTANT Resistant 2 SENSITIVE Sensitive RIFAMPIN <=0.5 SENSI... Sensitive <=0.5 SENSI... Sensitive TETRACYCLINE <=1 SENSITIVE Sensitive <=1 SENSITIVE Sensitive TRIMETH/SULFA <=10 SENSIT Sensitive <=10 SENSIT Sensitive ... Marland Kitchen.. VANCOMYCIN 1 SENSITIVE Sensitive <=0.5 SENSI... Sensitive Right foot bone . Component 3 wk ago Specimen Description BONE Special Requests RIGHT 4 METATARSAL SAMPLE B Gram Stain NO WBC SEEN NO ORGANISMS SEEN Culture RARE METHICILLIN RESISTANT STAPHYLOCOCCUS AUREUS NO ANAEROBES ISOLATED Performed at [facility], [place] [address], [place], [place] [address] Report Status 10/08/2019 FINAL Organism ID, Bacteria METHICILLIN RESISTANT STAPHYLOCOCCUS AUREUS Resulting Agency [facility] Susceptibility Methicillin resistant staphylococcus aureus MIC CIPROFLOXACIN >=8 RESISTANT Resistant CLINDAMYCIN <=0.25 SENS... Sensitive ERYTHROMYCIN >=8 RESISTANT Resistant GENTAMICIN <=0.5 SENSI... Sensitive Inducible Clindamycin NEGATIVE Sensitive OXACILLIN >=4 RESISTANT Resistant RIFAMPIN <=0.5 SENSI... Sensitive TETRACYCLINE <=1 SENSITIVE Sensitive TRIMETH/SULFA <=10 SENSIT Sensitive ... VANCOMYCIN 1 SENSITIVE Sensitive This is a patient we had in clinic earlier this year with a wound over his left fifth metatarsal head. 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 Doran Durand on 05/16/2019. He seems to have developed problems with wounds on his bilateral feet in June 2021 although he may have had problems earlier than this. He was in an urgent care with a right foot ulcer on 09/26/2019 and given a course of doxycycline. This was apparently after having trouble getting into see orthopedics. He was seen by podiatry on 09/28/2019 noted to have bilateral lower extremity ulcers including the left lateral fifth metatarsal base and the right subfifth met head. It was noted that had purulent drainage at that time. He required hospitalization from 6/20 through 7/2. This was because of worsening right foot wounds. He underwent bilateral operative incision and drainage and bone biopsies bilaterally. Culture results are listed above. He has been referred back to clinic by Dr. Jacqualyn Posey of podiatry. He is also followed by Dr. Megan Salon who saw him yesterday. He was discharged from hospital on Zyvox Flagyl and Levaquin and yesterday changed to doxycycline Flagyl and Levaquin. His inflammatory markers on 6/26 showed a sedimentation rate of 129 and a C-reactive protein of 5. This is improved to 14 and 1.3 respectively. This would indicate improvement. ABIs in our clinic today were 1.23 on the right and 1.20 on the left 11/01/2019 on evaluation today patient appears to be doing fairly well in regard to the wounds on his feet at this point. Fortunately there is no signs of active infection at this time. No fevers, chills, nausea, vomiting, or diarrhea. He currently is seeing infectious disease and still under their care at this point. Subsequently he also has both wounds which she has not  been using collagen on as he did not receive that in his packaging he did not call us and  let us know that. Apparently that just was missed on the order. Nonetheless we will get that straightened out today. 8/9-Patient returns for bilateral foot wounds, using Prisma with hydrogel moistened dressings, and the wounds appear stable. Patient using surgical shoes, avoiding much pressure or weightbearing as much as possible 8/16; patient has bilateral foot wounds. 1 on the right lateral foot proximally the other is on the left mid lateral foot. Both required debridement of callus and thick skin around the wounds. We have been using silver collagen 8/27; patient has bilateral lateral foot wounds. The area on the left substantially surrounded by callus and dry skin. This was removed from the wound edge. The underlying wound is small. The area on the right measured somewhat smaller today. 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 Unfortunately I did not update his antibiotics during today's visit. 9/10 I reviewed Dr. Hale Bogus last notes he felt he had completed antibiotics his inflammatory markers were reasonably well controlled. He has a small wound on the lateral left foot and a tiny area on the right which is just above closed. He is using Hydrofera Blue with border foam he has bilateral surgical shoes 9/24; 2 week f/u. doing well. right foot is closed. left foot still undermined. 10/14; right foot remains closed at the fifth met head. The area over the base of the left fifth metatarsal has a small open area but considerable undermining towards the plantar foot. Thick callus skin around this suggests an adequate pressure relief. We have talked about this. He says he is going to go back into his cam boot. I suggested a total contact cast he did not seem enamored with this suggestion 10/26; left foot base of the fifth metatarsal. Same condition as last time. He has skin over the area with an open wound however the skin is not  adherent. He went to see Dr. Earleen Newport who did an x-ray and culture of his foot I have not reviewed the x-ray but the patient was not told anything. He is on doxycycline 11/11; since the patient was last here he was in the emergency room on 10/30 he was concerned about swelling in the left foot. They did not do any cultures or x-rays. They changed his antibiotics to cephalexin. Previous culture showed group B strep. The cephalexin is appropriate as doxycycline has less than predictable coverage. Arrives in clinic today with swelling over this area under the wound. He also has a new wound on the right fifth metatarsal head 11/18; the patient has a difficult wound on the lateral aspect of the left fifth metatarsal head. The wound was almost ballotable last week I opened it slightly expecting to see purulence however there was just bleeding. I cultured this this was negative. X-ray unchanged. We are trying to get an MRI but I am not sure were going to be able to get this through his insurance. He also has an area on the right lateral fifth metatarsal head this looks healthier 12/3; the patient finally got our MRI. Surprisingly this did not show osteomyelitis. I did show the soft tissue ulceration at the lateral plantar aspect of the fifth metatarsal base with a tiny residual 6 mm abscess overlying the superficial fascia I have tried to culture this area I have not been able to get this to grow  anything. Nevertheless the protruding tissue looks aggravated. I suspect we should try to treat the underlying "abscess with broad-spectrum antibiotics. I am going to start him on Levaquin and Flagyl. He has much less edema in his legs and I am going to continue to wrap his legs and see him weekly 12/10. I started Levaquin and Flagyl on him last week. He just picked up the Flagyl apparently there was some delay. The worry is the wound on the left fifth metatarsal base which is substantial and worsening. His foot looks  like he inverts at the ankle making this a weightbearing surface. Certainly no improvement in fact I think the measurements of this are somewhat worse. We have been using 12/17; he apparently just got the Levaquin yesterday this is 2 weeks after the fact. He has completed the Flagyl. The area over the left fifth metatarsal base still has protruding granulation tissue although it does not look quite as bad as it did some weeks ago. He has severe bilateral lymphedema although we have not been treating him for wounds on his legs this is definitely going to require compression. There was so much edema in the left I did not wish to put him in a total contact cast today. I am going to increase his compression from 3-4 layer. The area on the right lateral fifth met head actually look quite good and superficial. Objective Constitutional Sitting or standing Blood Pressure is within target range for patient.. Pulse regular and within target range for patient.Marland Kitchen Respirations regular, non-labored and within target range.. Temperature is normal and within the target range for the patient.Marland Kitchen Appears in no distress. Vitals Time Taken: 4:04 PM, Height: 77 in, Weight: 280 lbs, BMI: 33.2, Temperature: 98.7 F, Pulse: 101 bpm, Respiratory Rate: 18 breaths/min, Blood Pressure: 137/85 mmHg. General Notes: Wound exam; the left lateral fifth metatarsal base wound looks somewhat better. The tissue looks less bulging and less angry. The tissue actually looks better. However there is no improvement in size. He has not yet started the Levaquin [facility] has massive bilateral lymphedema. This is not well controlled in 3 layer compression Integumentary (Hair, Skin) Wound #3 status is Open. Original cause of wound was Trauma. The wound is located on the Left,Lateral Foot. The wound measures 2.7cm length x 3.3cm width x 1cm depth; 6.998cm^2 area and 6.998cm^3 volume. There is Fat Layer (Subcutaneous Tissue) exposed. There is no  tunneling or undermining noted. There is a large amount of serosanguineous drainage noted. The wound margin is well defined and not attached to the wound base. There is small (1-33%) red, pink, hyper - granulation within the wound bed. There is a large (67-100%) amount of necrotic tissue within the wound bed including Adherent Slough. Wound #4 status is Open. Original cause of wound was Gradually Appeared. The wound is located on the Right,Lateral Foot. The wound measures 0.3cm length x 0.5cm width x 0.1cm depth; 0.118cm^2 area and 0.012cm^3 volume. There is Fat Layer (Subcutaneous Tissue) exposed. There is no tunneling or undermining noted. There is a small amount of serosanguineous drainage noted. The wound margin is distinct with the outline attached to the wound base. There is large (67- 100%) red granulation within the wound bed. There is no necrotic tissue within the wound bed. Assessment Active Problems ICD-10 Type 2 diabetes mellitus with foot ulcer 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ity 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ity Procedures Wound #3  Pre-procedure diagnosis of Wound #3 is a Diabetic Wound/Ulcer of the Lower Extremity located on the Left,Lateral Foot . There was a Four Layer Compression Therapy Procedure by [name], RN. Post procedure Diagnosis Wound #3: Same as Pre-Procedure Wound #4 Pre-procedure diagnosis of Wound #4 is a Diabetic Wound/Ulcer of the Lower Extremity located on the Right,Lateral Foot . There was a Four Layer Compression Therapy Procedure by [name], RN. Post procedure Diagnosis Wound #4: Same as Pre-Procedure Plan Follow-up Appointments: Return Appointment in 1 week. - Thursday 12/23 Bathing/ Shower/ Hygiene: May shower with protection but do not get wound dressing(s) wet. - may use cast protectors Edema Control - Lymphedema / SCD / Other: Elevate  legs to the level of the heart or above for 30 minutes daily and/or when sitting, a frequency of: Avoid standing for long periods of time. Exercise regularly Off-Loading: Open toe surgical shoe to: - both feet, pad left shoe with felt to offload lateral foot WOUND #3: - Foot Wound Laterality: Left, Lateral Peri-Wound Care: Sween Lotion (Moisturizing lotion) 1 x Per Week/ Discharge Instructions: Apply moisturizing lotion as directed Prim Dressing: Hydrofera Blue Classic Foam, 2x2 in 1 x Per Week/ ary Discharge Instructions: Apply to wound bed as instructed Secondary Dressing: Woven Gauze Sponge, Non-Sterile 4x4 in 1 x Per Week/ Discharge Instructions: Apply over primary dressing as directed. Secondary Dressing: ABD Pad, 5x9 1 x Per Week/ Discharge Instructions: Apply over primary dressing as directed. Secondary Dressing: Felt 2.5 yds x 5.5 in 1 x Per Week/ Discharge Instructions: Apply over primary dressing as callous pad Com pression Wrap: FourPress (4 layer compression wrap) 1 x Per Week/ Discharge Instructions: Apply four layer compression as directed. WOUND #4: - Foot Wound Laterality: Right, Lateral Peri-Wound Care: Sween Lotion (Moisturizing lotion) Discharge Instructions: Apply moisturizing lotion as directed Prim Dressing: Hydrofera Blue Classic Foam, 2x2 in ary Discharge Instructions: Apply to wound bed as instructed Secondary Dressing: Woven Gauze Sponges 2x2 in Discharge Instructions: Apply over primary dressing as directed. Secondary Dressing: Felt 2.5 yds x 5.5 in Discharge Instructions: Apply over primary dressing as callous pad Com pression Wrap: FourPress (4 layer compression wrap) Discharge Instructions: Apply four layer compression as directed. #1 I elected not to put him in a total contact cast today simply too much edema in his legs 2. I continued with Hydrofera Blue to both areas but increase this compression from 3 layer to 4-layer 3. I will look towards putting  a total contact cast on the left foot in early January. 4. For some reason it took him 2 weeks plus to get the Levaquin which is the other reason for not putting him in a cast today. He has already completed the flat. He did not have osteomyelitis colitis but he did have an abscess under the wound on the left lateral foot which was small and not amenable to drain Electronic Signature(s) Signed: 03/20/2020 4:58:57 PM By: [name] MD Entered By: [name] on 03/20/2020 16:56:23 -------------------------------------------------------------------------------- SuperBill Details Patient Name: Date of Service: [name], [name] 03/20/2020 Medical Record Number: [id number] Patient Account Number: [id number] Date of Birth/Sex: Treating RN: [birth year] (35 y.o. 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

## 2020-03-26 ENCOUNTER — Other Ambulatory Visit: Payer: Self-pay

## 2020-03-26 ENCOUNTER — Encounter (HOSPITAL_BASED_OUTPATIENT_CLINIC_OR_DEPARTMENT_OTHER): Payer: BC Managed Care – PPO | Admitting: Internal Medicine

## 2020-03-26 DIAGNOSIS — E11621 Type 2 diabetes mellitus with foot ulcer: Secondary | ICD-10-CM | POA: Diagnosis not present

## 2020-03-26 NOTE — Progress Notes (Signed)
[name], [name] ([id number]) Visit Report for 03/26/2020 Debridement Details Patient Name: Date of Service: [name] 03/26/2020 1:30 PM Medical Record Number: [id number] Patient Account Number: [id number] Date of Birth/Sex: Treating RN: [birth year] (34 y.o. [name] Primary Care Provider: [name] Other Clinician: Referring Provider: Treating Provider/Extender: [name] in Treatment: 22 Debridement Performed for Assessment: Wound #3 Left,Lateral Foot Performed By: Physician [name], MD Debridement Type: Debridement Severity of Tissue Pre Debridement: Fat layer exposed Level of Consciousness (Pre-procedure): Awake and Alert Pre-procedure Verification/Time Out Yes - 14:14 Taken: Start Time: 14:15 Pain Control: Lidocaine 4% T opical Solution T Area Debrided (L x W): otal 3 (cm) x 3 (cm) = 9 (cm) Tissue and other material debrided: Viable, Non-Viable, Callus, Slough, Subcutaneous, Skin: Dermis , Skin: Epidermis, Fibrin/Exudate, Slough Level: Skin/Subcutaneous Tissue Debridement Description: Excisional Instrument: Blade, Forceps Bleeding: Moderate Hemostasis Achieved: Silver Nitrate End Time: 14:23 Procedural Pain: 0 Post Procedural Pain: 0 Response to Treatment: Procedure was tolerated well Level of Consciousness (Post- Awake and Alert procedure): Post Debridement Measurements of Total Wound Length: (cm) 2.8 Width: (cm) 3.1 Depth: (cm) 1 Volume: (cm) 6.817 Character of Wound/Ulcer Post Debridement: Requires Further Debridement Severity of Tissue Post Debridement: Fat layer exposed Post Procedure Diagnosis Same as Pre-procedure Electronic Signature(s) Signed: 03/26/2020 3:50:51 PM By: [name] MD Signed: 03/26/2020 4:19:56 PM By: Deon Pilling Entered By: [name] on 03/26/2020 14:32:03 -------------------------------------------------------------------------------- Debridement Details Patient  Name: Date of Service: [name], [name] 03/26/2020 1:30 PM Medical Record Number: [id number] Patient Account Number: [id number] Date of Birth/Sex: Treating RN: Aug 06, 1985 (34 y.o. [name], [name] Primary Care Provider: [name] Other Clinician: Referring Provider: Treating Provider/Extender: [name] in Treatment: 22 Debridement Performed for Assessment: Wound #4 Right,Lateral Foot Performed By: Physician [name], MD Debridement Type: Debridement Severity of Tissue Pre Debridement: Fat layer exposed Level of Consciousness (Pre-procedure): Awake and Alert Pre-procedure Verification/Time Out Yes - 14:14 Taken: Start Time: 14:15 Pain Control: Lidocaine 4% Topical Solution T Area Debrided (L x W): otal 1 (cm) x 2 (cm) = 2 (cm) Tissue and other material debrided: Non-Viable, Callus Level: Non-Viable Tissue Debridement Description: Selective/Open Wound Instrument: Blade, Forceps Bleeding: Minimum Hemostasis Achieved: Pressure End Time: 14:23 Procedural Pain: 0 Post Procedural Pain: 0 Response to Treatment: Procedure was tolerated well Level of Consciousness (Post- Awake and Alert procedure): Post Debridement Measurements of Total Wound Length: (cm) 1 Width: (cm) 2 Depth: (cm) 0.1 Volume: (cm) 0.157 Character of Wound/Ulcer Post Debridement: Improved Severity of Tissue Post Debridement: Fat layer exposed Post Procedure Diagnosis Same as Pre-procedure Electronic Signature(s) Signed: 03/26/2020 3:50:51 PM By: [name] MD Signed: 03/26/2020 4:19:56 PM By: Deon Pilling Entered By: [name] on 03/26/2020 14:32:12 -------------------------------------------------------------------------------- HPI Details Patient Name: Date of Service: [name], [name] 03/26/2020 1:30 PM Medical Record Number: [id number] Patient Account Number: [id number] Date of Birth/Sex: Treating RN: November 24, 1985 (34 y.o. 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 He comes in for review of a wound over the plantar fifth metatarsal head extending into the lateral part of the foot. He was followed for this previously by his podiatrist Dr. Cornelius Moras. As the patient tells his story he went to see podiatry first for a swelling  he developed on the lateral part of his fifth metatarsal head in May. He states this was "open" by podiatry and the area closed. He was followed up in June and it was again opened callus removed and it closed promptly. There were plans being made for surgery on the fifth metatarsal head in June however his blood sugar was apparently too high for anesthesia. Apparently the area was debrided and opened again in June and it is never closed since. Looking over the records from podiatry I am really not able to follow this. It was clear when he was first seen it was before 5/14 at that point he already had a wound. By 5/17 the ulcer was resolved. I do not see anything about a procedure. On 5/28 noted to have pre-ulcerative moderate keratosis. X-ray noted 1/5 contracted toe and tailor's bunion and metatarsal deformity. On a visit date on 09/28/2018 the dorsal part of the left foot it healed and resolved. There was concern about swelling in his lower extremity he was sent to the ER.. As far as I can tell he was seen in the ER on 7/12 with an ulcer on his left foot. A DVT rule out of the left leg was negative. I do not think I have complete records from podiatry but I am not able to verify the procedures this patient states he had. He states after the last procedure the wound has never closed although I am not able to follow this in the records I have from  podiatry. He has not had a recent x-ray The patient has been using Neosporin on the wound. He is wearing a Darco shoe. He is still very active up on his foot working and exercising. Past medical history; type 2 diabetes ketosis-prone, leg swelling with a negative DVT study in July. Non-smoker ABI in our clinic was 0.85 on the left 10/16; substantial wound on the plantar left fifth met head extending laterally almost to the dorsal fifth MTP. We have been using silver alginate we gave him a Darco forefoot off loader. An x-ray did not show evidence of osteomyelitis did note soft tissue emphysema which I think was due to gas tracking through an open wound. There is no doubt in my mind he requires an MRI 10/23; MRI not booked until 3 November at the earliest this is largely due to his glucose sensor in the right arm. We have been using silver alginate. There has been an improvement 10/29; I am still not exactly sure when his MRI is booked for. He says it is the third but it is the 10th in epic. This definitely needs to be done. He is running a low-grade fever today but no other symptoms. No real improvement in the 1 02/26/2019 patient presents today for a follow-up visit here in our clinic he is last been seen in the clinic on October 29. Subsequently we were working on getting MRI to evaluate and see what exactly was going on and where we would need to go from the standpoint of whether or not he had osteomyelitis and again what treatments were going be required. Subsequently the patient ended up being admitted to the hospital on 02/07/2019 and was discharged on 02/14/2019. This is a somewhat interesting admission with a discharge diagnosis of pneumonia due to COVID-19 although he was positive for COVID-19 when tested at the urgent care but negative x2 when he was actually in the hospital. With that being said he did have acute respiratory failure  with hypoxia and it was noted he also have a left foot  ulceration with osteomyelitis. With that being said he did require oxygen for his pneumonia and I level 4 L. He was placed on antivirals and steroids for the COVID-19. He was also transferred to the [facility] at one point. Nonetheless he did subsequently discharged home and since being home has done much better in that regard. The CT angiogram did not show any pulmonary embolism. With regard to the osteomyelitis the patient was placed on vancomycin and Zosyn while in the hospital but has been changed to Augmentin at discharge. It was also recommended that he follow- up with wound care and podiatry. Podiatry however wanted him to see [name] according to the patient prior to them doing anything further. His hemoglobin A1c was 9.9 as noted in the hospital. Have an MRI of the left foot performed while in the hospital on 02/04/2019. 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 There is an overlying plantar open wound noted an abscess tracking back along the lateral aspect of the fifth metatarsal shaft. There is otherwise diffuse cellulitis and mild fasciitis without findings of polymyositis. The patient did have recently pneumonia secondary to COVID-19 I looked in the chart through epic and it does appear that the patient may need to have an additional x-ray just to ensure everything is cleared and that he has no airspace disease prior to putting him into the [name]. 03/05/2019; patient was readmitted to the clinic last week. He was hospitalized twice for a viral upper respiratory tract infection from 11/1 through 11/4 and then 11/5 through 11/12 ultimately this turned out to be Covid pneumonitis. Although he was discharged on oxygen he is not using it. He says he feels fine. He has no exercise limitation no cough no sputum. His O2 sat in our clinic today was 100% on room air. 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 He received Vanco and Zosyn in the hospital and then was discharged on 2 weeks of Augmentin. I do not see any relevant cultures. He was supposed to follow-up with infectious disease but I do not see that he has an appointment. 12/8; patient saw Dr. Novella Olive of infectious disease last week. He felt that he had had adequate antibiotic therapy. He did not go to follow-up with Dr. Amalia Hailey of podiatry and I have again talked to him about the pros and cons of this. He does not want to consider a ray amputation of this time. He is aware of the risks of recurrence, migration etc. He started HBO today and tolerated this well. He can complete the Augmentin that I gave him last week. I have looked over the lab work that Dr. Chana Bode ordered his C-reactive protein was 3.3 and his sedimentation rate was 17. The C-reactive protein is never really been measurably that high in this patient 12/15; not much change in the wound today however he has undermining along the lateral part of the foot again more extensively than last week. He has some rims of epithelialization. We have been using silver alginate. He is undergoing hyperbarics but did not dive today 12/18; in for his obligatory first total contact cast change. Unfortunately there was pus coming from the undermining area around his fifth metatarsal head. This was cultured but  will preclude reapplication of a cast. He is seen in conjunction with HBO 12/24; patient had staph lugdunensis in the wound in the undermining area laterally last time. We put him on doxycycline which should have covered this. The wound looks better today. I am going to give him another week of doxycycline before reattempting the total contact cast 12/31; the patient is completing antibiotics. Hemorrhagic debris in the distal part of the wound with some undermining  distally. He also had hyper granulation. Extensive debridement with a #5 curette. The infected area that was on the lateral part of the fifth met head is closed over. I do not think he needs any more antibiotics. Patient was seen prior to HBO. Preparations for a total contact cast were made in the cast will be placed post hyperbarics 04/11/19; once again the patient arrives today without complaint. He had been in a cast all week noted that he had heavy drainage this week. This resulted in large raised areas of macerated tissue around the wound 1/14; wound bed looks better slightly smaller. Hydrofera Blue has been changing himself. He had a heavy drainage last week which caused a lot of maceration around the wound so I took him out of a total contact cast he says the drainage is actually better this week He is seen today in conjunction with HBO 1/21; returns to clinic. He was up in [place] for a day or 2 attending a funeral. He comes back in with the wound larger and with a large area of exposed bone. He had osteomyelitis and septic arthritis of the fifth left metatarsal head while he was in hospital. He received IV antibiotics in the hospital for a prolonged period of time then 3 weeks of Augmentin. Subsequently I gave him 2 weeks of doxycycline for more superficial wound infection. When I saw this last week the wound was smaller the surface of the wound looks satisfactory. 1/28; patient missed hyperbarics today. Bone biopsy I did last time showed Enterococcus faecalis and Staphylococcus lugdunensis . He has a wide area of exposed bone. We are going to use silver alginate as of today. I had another ethical discussion with the patient. 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 In this situation I think the likelihood of healing this is low. Therefore I have recommended a ray amputation and with the patient's agreement I have referred him to Dr. Doran Durand. The other issue  is that his compliance with hyperbarics has been minimal because of his work schedule and given his underlying decision I am going to stop this today READMISSION 10/24/2019 MRI 09/29/2019 left foot IMPRESSION: 1. Apparent skin ulceration inferior and lateral to the 5th metatarsal base with underlying heterogeneous T2 signal and enhancement in the subcutaneous fat. Small peripherally enhancing fluid collections along the plantar and lateral aspects of the 5th metatarsal base suspicious for abscesses. 2. Interval amputation through the mid 5th metatarsal with nonspecific low-level marrow edema and enhancement. Given the proximity to the adjacent soft tissue inflammatory changes, osteomyelitis cannot be excluded. 3. The additional bones appear unremarkable. MRI 09/29/2019 right foot IMPRESSION: 1. Soft tissue ulceration lateral to the 5th MTP joint. There is low-level T2 hyperintensity within the 4th and 5th metatarsal heads and adjacent proximal phalanges without abnormal T1 signal or cortical destruction. These findings are nonspecific and could be seen with early marrow edema, hyperemia or early osteomyelitis. No evidence of septic joint. 2. Mild tenosynovitis and synovial enhancement associated with the extensor digitorum tendons  at the level of the midfoot. 3. Diffuse low-level muscular T2 hyperintensity and enhancement, most consistent with diabetic myopathy. LEFT FOOT BONE Methicillin resistant staphylococcus aureus Staphylococcus lugdunensis MIC MIC CIPROFLOXACIN >=8 RESISTANT Resistant <=0.5 SENSI... Sensitive CLINDAMYCIN <=0.25 SENS... Sensitive >=8 RESISTANT Resistant ERYTHROMYCIN >=8 RESISTANT Resistant >=8 RESISTANT Resistant GENTAMICIN <=0.5 SENSI... Sensitive <=0.5 SENSI... Sensitive Inducible Clindamycin NEGATIVE Sensitive NEGATIVE Sensitive OXACILLIN >=4 RESISTANT Resistant 2 SENSITIVE Sensitive RIFAMPIN <=0.5 SENSI... Sensitive <=0.5 SENSI... Sensitive TETRACYCLINE  <=1 SENSITIVE Sensitive <=1 SENSITIVE Sensitive TRIMETH/SULFA <=10 SENSIT Sensitive <=10 SENSIT Sensitive ... Marland Kitchen.. VANCOMYCIN 1 SENSITIVE Sensitive <=0.5 SENSI... Sensitive Right foot bone . Component 3 wk ago Specimen Description BONE Special Requests RIGHT 4 METATARSAL SAMPLE B Gram Stain NO WBC SEEN NO ORGANISMS SEEN Culture RARE METHICILLIN RESISTANT STAPHYLOCOCCUS AUREUS NO ANAEROBES ISOLATED Performed at [facility], [place] [address] [place], [place] [address] Report Status 10/08/2019 FINAL Organism ID, Bacteria METHICILLIN RESISTANT STAPHYLOCOCCUS AUREUS Resulting Agency [facility] Susceptibility Methicillin resistant staphylococcus aureus MIC CIPROFLOXACIN >=8 RESISTANT Resistant CLINDAMYCIN <=0.25 SENS... Sensitive ERYTHROMYCIN >=8 RESISTANT Resistant GENTAMICIN <=0.5 SENSI... Sensitive Inducible Clindamycin NEGATIVE Sensitive OXACILLIN >=4 RESISTANT Resistant RIFAMPIN <=0.5 SENSI... Sensitive TETRACYCLINE <=1 SENSITIVE Sensitive TRIMETH/SULFA <=10 SENSIT Sensitive ... VANCOMYCIN 1 SENSITIVE Sensitive This is a patient we had in clinic earlier this year with a wound over his left fifth metatarsal head. 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 Doran Durand on 05/16/2019. He seems to have developed problems with wounds on his bilateral feet in June 2021 although he may have had problems earlier than this. He was in an urgent care with a right foot ulcer on 09/26/2019 and given a course of doxycycline. This was apparently after having trouble getting into see orthopedics. He was seen by podiatry on 09/28/2019 noted to have bilateral lower extremity ulcers including the left lateral fifth  metatarsal base and the right subfifth met head. It was noted that had purulent drainage at that time. He required hospitalization from 6/20 through 7/2. This was because of worsening right foot wounds. He underwent bilateral operative incision and drainage and bone biopsies bilaterally. Culture results are listed above. He has been referred back to clinic by Dr. Jacqualyn Posey of podiatry. He is also followed by Dr. Megan Salon who saw him yesterday. He was discharged from hospital on Zyvox Flagyl and Levaquin and yesterday changed to doxycycline Flagyl and Levaquin. His inflammatory markers on 6/26 showed a sedimentation rate of 129 and a C-reactive protein of 5. This is improved to 14 and 1.3 respectively. This would indicate improvement. ABIs in our clinic today were 1.23 on the right and 1.20 on the left 11/01/2019 on evaluation today patient appears to be doing fairly well in regard to the wounds on his feet at this point. Fortunately there is no signs of active infection at this time. No fevers, chills, nausea, vomiting, or diarrhea. He currently is seeing infectious disease and still under their care at this point. Subsequently he also has both wounds which she has not been using collagen on as he did not receive that in his packaging he did not call us and let us know that. Apparently that just was missed on the order. Nonetheless we will get that straightened out today. 8/9-Patient returns for  bilateral foot wounds, using Prisma with hydrogel moistened dressings, and the wounds appear stable. Patient using surgical shoes, avoiding much pressure or weightbearing as much as possible 8/16; patient has bilateral foot wounds. 1 on the right lateral foot proximally the other is on the left mid lateral foot. Both required debridement of callus and thick skin around the wounds. We have been using silver collagen 8/27; patient has bilateral lateral foot wounds. The area on the left substantially surrounded by  callus and dry skin. This was removed from the wound edge. The underlying wound is small. The area on the right measured somewhat smaller today. 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 Unfortunately I did not update his antibiotics during today's visit. 9/10 I reviewed Dr. Hale Bogus last notes he felt he had completed antibiotics his inflammatory markers were reasonably well controlled. He has a small wound on the lateral left foot and a tiny area on the right which is just above closed. He is using Hydrofera Blue with border foam he has bilateral surgical shoes 9/24; 2 week f/u. doing well. right foot is closed. left foot still undermined. 10/14; right foot remains closed at the fifth met head. The area over the base of the left fifth metatarsal has a small open area but considerable undermining towards the plantar foot. Thick callus skin around this suggests an adequate pressure relief. We have talked about this. He says he is going to go back into his cam boot. I suggested a total contact cast he did not seem enamored with this suggestion 10/26; left foot base of the fifth metatarsal. Same condition as last time. He has skin over the area with an open wound however the skin is not adherent. He went to see Dr. Earleen Newport who did an x-ray and culture of his foot I have not reviewed the x-ray but the patient was not told anything. He is on doxycycline 11/11; since the patient was last here he was in the emergency room on 10/30 he was concerned about swelling in the left foot. They did not do any cultures or x-rays. They changed his antibiotics to cephalexin. Previous culture showed group B strep. The cephalexin is appropriate as doxycycline has less than predictable coverage. Arrives in clinic today with swelling over this area under the wound. He also has a new wound on the right fifth metatarsal head 11/18; the patient has a  difficult wound on the lateral aspect of the left fifth metatarsal head. The wound was almost ballotable last week I opened it slightly expecting to see purulence however there was just bleeding. I cultured this this was negative. X-ray unchanged. We are trying to get an MRI but I am not sure were going to be able to get this through his insurance. He also has an area on the right lateral fifth metatarsal head this looks healthier 12/3; the patient finally got our MRI. Surprisingly this did not show osteomyelitis. I did show the soft tissue ulceration at the lateral plantar aspect of the fifth metatarsal base with a tiny residual 6 mm abscess overlying the superficial fascia I have tried to culture this area I have not been able to get this to grow anything. Nevertheless the protruding tissue looks aggravated. I suspect we should try to treat the underlying "abscess with broad-spectrum antibiotics. I am going to start him on Levaquin and Flagyl. He has much less edema in his legs and I am going  to continue to wrap his legs and see him weekly 12/10. I started Levaquin and Flagyl on him last week. He just picked up the Flagyl apparently there was some delay. The worry is the wound on the left fifth metatarsal base which is substantial and worsening. His foot looks like he inverts at the ankle making this a weightbearing surface. Certainly no improvement in fact I think the measurements of this are somewhat worse. We have been using 12/17; he apparently just got the Levaquin yesterday this is 2 weeks after the fact. He has completed the Flagyl. The area over the left fifth metatarsal base still has protruding granulation tissue although it does not look quite as bad as it did some weeks ago. He has severe bilateral lymphedema although we have not been treating him for wounds on his legs this is definitely going to require compression. There was so much edema in the left I did not wish to put him in  a total contact cast today. I am going to increase his compression from 3-4 layer. The area on the right lateral fifth met head actually look quite good and superficial. 12/23; patient arrived with callus on the right fifth met head and the substantial hyper granulated callused wound on the base of his fifth metatarsal. He says he is completing his Levaquin in 2 days but I do not think that adds up with what I gave him but I will have to double check this. We are using Hydrofera Blue on both areas. My plan is to put the left leg in a cast the week after Lysle Morales Electronic Signature(s) Signed: 03/26/2020 3:50:51 PM By: [name] MD Entered By: [name] on 03/26/2020 14:34:15 -------------------------------------------------------------------------------- Physical Exam Details Patient Name: Date of Service: [name], [name] 03/26/2020 1:30 PM Medical Record Number: [id number] Patient Account Number: [id number] Date of Birth/Sex: Treating RN: January 06, 1986 (34 y.o. [name] Primary Care Provider: [name] Other Clinician: Referring Provider: Treating Provider/Extender: [name] in Treatment: 54 Constitutional Patient is hypertensive.. Pulse regular and within target range for patient.Marland Kitchen Respirations regular, non-labored and within target range.. Temperature is normal and within the target range for the patient.Marland Kitchen Appears in no distress. Notes Wound exam left lateral fifth metatarsal base. Hyper granulated tissue callus. I removed all of this with a #10 scalpel. Hemostasis with silver nitrate The right fifth metatarsal head had callused over the wound area. I remove this this is not closed Electronic Signature(s) Signed: 03/26/2020 3:50:51 PM By: [name] MD Entered By: [name] on 03/26/2020 14:35:56 -------------------------------------------------------------------------------- Physician Orders Details Patient Name:  Date of Service: [name], [name] 03/26/2020 1:30 PM Medical Record Number: [id number] Patient Account Number: [id number] Date of Birth/Sex: Treating RN: November 29, 1985 (34 y.o. [name] Primary Care Provider: [name] Other Clinician: Referring Provider: Treating Provider/Extender: [name] in Treatment: 22 Verbal / Phone Orders: No Diagnosis Coding Follow-up Appointments Return Appointment in 2 weeks. Nurse Visit: - Wednesday Bathing/ Shower/ Hygiene May shower with protection but do not get wound dressing(s) wet. - may use cast protectors Edema Control - Lymphedema / SCD / Other Bilateral Lower Extremities Elevate legs to the level of the heart or above for 30 minutes daily and/or when sitting, a frequency of: Avoid standing for long periods of time. Exercise regularly Off-Loading Open toe surgical shoe to: - both feet, pad left shoe with felt to offload lateral foot Wound Treatment Wound #3 - Foot  Wound Laterality: Left, Lateral Peri-Wound Care: Sween Lotion (Moisturizing lotion) 1 x Per Week Discharge Instructions: Apply moisturizing lotion as directed Prim Dressing: Hydrofera Blue Classic Foam, 2x2 in 1 x Per Week ary Discharge Instructions: Apply to wound bed as instructed Secondary Dressing: Woven Gauze Sponge, Non-Sterile 4x4 in 1 x Per Week Discharge Instructions: Apply over primary dressing as directed. Secondary Dressing: ABD Pad, 5x9 1 x Per Week Discharge Instructions: Apply over primary dressing as directed. Secondary Dressing: Felt 2.5 yds x 5.5 in 1 x Per Week Discharge Instructions: Apply over primary dressing as callous pad Compression Wrap: FourPress (4 layer compression wrap) 1 x Per Week Discharge Instructions: apply unna boot first layer at upper portion of lower leg. Apply four layer compression as directed. Wound #4 - Foot Wound Laterality: Right, Lateral Peri-Wound Care: Sween Lotion (Moisturizing lotion) 1 x Per  Week Discharge Instructions: Apply moisturizing lotion as directed Prim Dressing: Hydrofera Blue Classic Foam, 2x2 in 1 x Per Week ary Discharge Instructions: Apply to wound bed as instructed Secondary Dressing: Woven Gauze Sponge, Non-Sterile 4x4 in 1 x Per Week Discharge Instructions: Apply over primary dressing as directed. Secondary Dressing: ABD Pad, 5x9 1 x Per Week Discharge Instructions: Apply over primary dressing as directed. Secondary Dressing: Felt 2.5 yds x 5.5 in 1 x Per Week Discharge Instructions: Apply over primary dressing as callous pad Compression Wrap: FourPress (4 layer compression wrap) 1 x Per Week Discharge Instructions: apply unna boot first layer at upper portion of lower leg. Apply four layer compression as directed. Electronic Signature(s) Signed: 03/26/2020 3:50:51 PM By: [name] MD Signed: 03/26/2020 4:19:56 PM By: Deon Pilling Entered By: Deon Pilling on 03/26/2020 14:26:11 -------------------------------------------------------------------------------- Problem List Details Patient Name: Date of Service: [name], [name] 03/26/2020 1:30 PM Medical Record Number: [id number] Patient Account Number: [id number] Date of Birth/Sex: Treating RN: January 13, 1986 (34 y.o. 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, right ankle and foot 10/24/2019  10/24/2019 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, left ankle and foot 10/24/2019 10/24/2019 B95.62 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 10/24/2019 10/24/2019 classified elsewhere Resolved Problems Electronic Signature(s) Signed: 03/26/2020 3:50:51 PM By: [name] MD Entered By: [name] on 03/26/2020 14:31:44 -------------------------------------------------------------------------------- Progress Note Details Patient Name: Date of Service: [name], [name] 03/26/2020 1:30 PM Medical Record Number: [id number] Patient Account Number: [id number] Date of Birth/Sex: Treating RN: May 15, 1985 (34 y.o. 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 He comes in for review of a wound over the plantar fifth metatarsal head extending into the lateral part of the foot. He was followed for this previously by his podiatrist Dr. Cornelius Moras. As the patient tells his story he went to see podiatry first for a swelling he developed on the lateral part of his fifth  metatarsal head in May. He states this was "open" by podiatry and the area closed. He was followed up in June and it was again opened callus removed and it closed promptly. There were plans being made for surgery on the fifth metatarsal head in June however his blood sugar was apparently too high for anesthesia. Apparently the area was debrided and opened again in June and it is never closed since. Looking over the records from podiatry I am really not able to follow this. It was clear when he was first seen it was before 5/14 at that point he already had a wound. By 5/17 the ulcer was resolved. I do not see anything  about a procedure. On 5/28 noted to have pre-ulcerative moderate keratosis. X-ray noted 1/5 contracted toe and tailor's bunion and metatarsal deformity. On a visit date on 09/28/2018 the dorsal part of the left foot it healed and resolved. There was concern about swelling in his lower extremity he was sent to the ER.. As far as I can tell he was seen in the ER on 7/12 with an ulcer on his left foot. A DVT rule out of the left leg was negative. I do not think I have complete records from podiatry but I am not able to verify the procedures this patient states he had. He states after the last procedure the wound has never closed although I am not able to follow this in the records I have from podiatry. He has not had a recent x-ray The patient has been using Neosporin on the wound. He is wearing a Darco shoe. He is still very active up on his foot working and exercising. Past medical history; type 2 diabetes ketosis-prone, leg swelling with a negative DVT study in July. Non-smoker ABI in our clinic was 0.85 on the left 10/16; substantial wound on the plantar left fifth met head extending laterally almost to the dorsal fifth MTP. We have been using silver alginate we gave him a Darco forefoot off loader. An x-ray did not show evidence of osteomyelitis did note soft tissue emphysema which I think was due to gas tracking through an open wound. There is no doubt in my mind he requires an MRI 10/23; MRI not booked until 3 November at the earliest this is largely due to his glucose sensor in the right arm. We have been using silver alginate. There has been an improvement 10/29; I am still not exactly sure when his MRI is booked for. He says it is the third but it is the 10th in epic. This definitely needs to be done. He is running a low-grade fever today but no other symptoms. No real improvement in the 1 02/26/2019 patient presents today for a follow-up visit here in our clinic he is last been seen in the  clinic on October 29. Subsequently we were working on getting MRI to evaluate and see what exactly was going on and where we would need to go from the standpoint of whether or not he had osteomyelitis and again what treatments were going be required. Subsequently the patient ended up being admitted to the hospital on 02/07/2019 and was discharged on 02/14/2019. This is a somewhat interesting admission with a discharge diagnosis of pneumonia due to COVID-19 although he was positive for COVID-19 when tested at the urgent care but negative x2 when he was actually in the hospital. With that being said he did have acute respiratory failure with hypoxia and it was noted he also  have a left foot ulceration with osteomyelitis. With that being said he did require oxygen for his pneumonia and I level 4 L. He was placed on antivirals and steroids for the COVID-19. He was also transferred to the [facility] at one point. Nonetheless he did subsequently discharged home and since being home has done much better in that regard. The CT angiogram did not show any pulmonary embolism. With regard to the osteomyelitis the patient was placed on vancomycin and Zosyn while in the hospital but has been changed to Augmentin at discharge. It was also recommended that he follow- up with wound care and podiatry. Podiatry however wanted him to see [name] according to the patient prior to them doing anything further. His hemoglobin A1c was 9.9 as noted in the hospital. Have an MRI of the left foot performed while in the hospital on 02/04/2019. 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 There is an overlying plantar open wound noted an abscess tracking back along the lateral aspect of the fifth metatarsal shaft. There is otherwise diffuse cellulitis and mild fasciitis without findings of polymyositis. The patient did have recently  pneumonia secondary to COVID-19 I looked in the chart through epic and it does appear that the patient may need to have an additional x-ray just to ensure everything is cleared and that he has no airspace disease prior to putting him into the [name]. 03/05/2019; patient was readmitted to the clinic last week. He was hospitalized twice for a viral upper respiratory tract infection from 11/1 through 11/4 and then 11/5 through 11/12 ultimately this turned out to be Covid pneumonitis. Although he was discharged on oxygen he is not using it. He says he feels fine. He has no exercise limitation no cough no sputum. His O2 sat in our clinic today was 100% on room air. 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 He received Vanco and Zosyn in the hospital and then was discharged on 2 weeks of Augmentin. I do not see any relevant cultures. He was supposed to follow-up with infectious disease but I do not see that he has an appointment. 12/8; patient saw Dr. Novella Olive of infectious disease last week. He felt that he had had adequate antibiotic therapy. He did not go to follow-up with Dr. Amalia Hailey of podiatry and I have again talked to him about the pros and cons of this. He does not want to consider a ray amputation of this time. He is aware of the risks of recurrence, migration etc. He started HBO today and tolerated this well. He can complete the Augmentin that I gave him last week. I have looked over the lab work that Dr. Chana Bode ordered his C-reactive protein was 3.3 and his sedimentation rate was 17. The C-reactive protein is never really been measurably that high in this patient 12/15; not much change in the wound today however he has undermining along the lateral part of the foot again more extensively than last week. He has some rims of epithelialization. We have been using silver alginate. He is  undergoing hyperbarics but did not dive today 12/18; in for his obligatory first total contact cast change. Unfortunately there was pus coming from the undermining area around his fifth metatarsal head. This was cultured but will preclude reapplication of a cast. He is  seen in conjunction with HBO 12/24; patient had staph lugdunensis in the wound in the undermining area laterally last time. We put him on doxycycline which should have covered this. The wound looks better today. I am going to give him another week of doxycycline before reattempting the total contact cast 12/31; the patient is completing antibiotics. Hemorrhagic debris in the distal part of the wound with some undermining distally. He also had hyper granulation. Extensive debridement with a #5 curette. The infected area that was on the lateral part of the fifth met head is closed over. I do not think he needs any more antibiotics. Patient was seen prior to HBO. Preparations for a total contact cast were made in the cast will be placed post hyperbarics 04/11/19; once again the patient arrives today without complaint. He had been in a cast all week noted that he had heavy drainage this week. This resulted in large raised areas of macerated tissue around the wound 1/14; wound bed looks better slightly smaller. Hydrofera Blue has been changing himself. He had a heavy drainage last week which caused a lot of maceration around the wound so I took him out of a total contact cast he says the drainage is actually better this week He is seen today in conjunction with HBO 1/21; returns to clinic. He was up in [place] for a day or 2 attending a funeral. He comes back in with the wound larger and with a large area of exposed bone. He had osteomyelitis and septic arthritis of the fifth left metatarsal head while he was in hospital. He received IV antibiotics in the hospital for a prolonged period of time then 3 weeks of Augmentin. Subsequently I gave  him 2 weeks of doxycycline for more superficial wound infection. When I saw this last week the wound was smaller the surface of the wound looks satisfactory. 1/28; patient missed hyperbarics today. Bone biopsy I did last time showed Enterococcus faecalis and Staphylococcus lugdunensis . He has a wide area of exposed bone. We are going to use silver alginate as of today. I had another ethical discussion with the patient. 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 In this situation I think the likelihood of healing this is low. Therefore I have recommended a ray amputation and with the patient's agreement I have referred him to Dr. Doran Durand. The other issue is that his compliance with hyperbarics has been minimal because of his work schedule and given his underlying decision I am going to stop this today READMISSION 10/24/2019 MRI 09/29/2019 left foot IMPRESSION: 1. Apparent skin ulceration inferior and lateral to the 5th metatarsal base with underlying heterogeneous T2 signal and enhancement in the subcutaneous fat. Small peripherally enhancing fluid collections along the plantar and lateral aspects of the 5th metatarsal base suspicious for abscesses. 2. Interval amputation through the mid 5th metatarsal with nonspecific low-level marrow edema and enhancement. Given the proximity to the adjacent soft tissue inflammatory changes, osteomyelitis cannot be excluded. 3. The additional bones appear unremarkable. MRI 09/29/2019 right foot IMPRESSION: 1. Soft tissue ulceration lateral to the 5th MTP joint. There is low-level T2 hyperintensity within the 4th and 5th metatarsal heads and adjacent proximal phalanges without abnormal T1 signal or cortical destruction. These findings are nonspecific and could be seen with early marrow edema, hyperemia or early osteomyelitis. No evidence of septic joint. 2. Mild tenosynovitis and synovial enhancement associated with  the extensor digitorum tendons at the level of the midfoot. 3. Diffuse  low-level muscular T2 hyperintensity and enhancement, most consistent with diabetic myopathy. LEFT FOOT BONE Methicillin resistant staphylococcus aureus Staphylococcus lugdunensis MIC MIC CIPROFLOXACIN >=8 RESISTANT Resistant <=0.5 SENSI... Sensitive CLINDAMYCIN <=0.25 SENS... Sensitive >=8 RESISTANT Resistant ERYTHROMYCIN >=8 RESISTANT Resistant >=8 RESISTANT Resistant GENTAMICIN <=0.5 SENSI... Sensitive <=0.5 SENSI... Sensitive Inducible Clindamycin NEGATIVE Sensitive NEGATIVE Sensitive OXACILLIN >=4 RESISTANT Resistant 2 SENSITIVE Sensitive RIFAMPIN <=0.5 SENSI... Sensitive <=0.5 SENSI... Sensitive TETRACYCLINE <=1 SENSITIVE Sensitive <=1 SENSITIVE Sensitive TRIMETH/SULFA <=10 SENSIT Sensitive <=10 SENSIT Sensitive ... Marland Kitchen.. VANCOMYCIN 1 SENSITIVE Sensitive <=0.5 SENSI... Sensitive Right foot bone . Component 3 wk ago Specimen Description BONE Special Requests RIGHT 4 METATARSAL SAMPLE B Gram Stain NO WBC SEEN NO ORGANISMS SEEN Culture RARE METHICILLIN RESISTANT STAPHYLOCOCCUS AUREUS NO ANAEROBES ISOLATED Performed at [facility], [place] [address], [place], [place] [address] Report Status 10/08/2019 FINAL Organism ID, Bacteria METHICILLIN RESISTANT STAPHYLOCOCCUS AUREUS Resulting Agency [facility] Susceptibility Methicillin resistant staphylococcus aureus MIC CIPROFLOXACIN >=8 RESISTANT Resistant CLINDAMYCIN <=0.25 SENS... Sensitive ERYTHROMYCIN >=8 RESISTANT Resistant GENTAMICIN <=0.5 SENSI... Sensitive Inducible Clindamycin NEGATIVE Sensitive OXACILLIN >=4 RESISTANT Resistant RIFAMPIN <=0.5 SENSI... Sensitive TETRACYCLINE <=1 SENSITIVE Sensitive TRIMETH/SULFA <=10 SENSIT Sensitive ... VANCOMYCIN 1 SENSITIVE Sensitive This is a patient we had in clinic earlier this year with a wound over his left fifth metatarsal head. 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 Doran Durand on 05/16/2019. He seems to have developed problems with wounds on his bilateral feet in June 2021 although he may have had problems earlier than this. He was in an urgent care with a right foot ulcer on 09/26/2019 and given a course of doxycycline. This was apparently after having trouble getting into see orthopedics. He was seen by podiatry on 09/28/2019 noted to have bilateral lower extremity ulcers including the left lateral fifth metatarsal base and the right subfifth met head. It was noted that had purulent drainage at that time. He required hospitalization from 6/20 through 7/2. This was because of worsening right foot wounds. He underwent bilateral operative incision and drainage and bone biopsies bilaterally. Culture results are listed above. He has been referred back to clinic by Dr. Jacqualyn Posey of podiatry. He is also followed by Dr. Megan Salon who saw him yesterday. He was discharged from hospital on Zyvox Flagyl and Levaquin and yesterday changed to doxycycline Flagyl and Levaquin. His inflammatory markers on 6/26 showed a sedimentation rate of 129 and a C-reactive protein of 5. This is improved to 14 and 1.3 respectively. This would indicate improvement. ABIs in our clinic today were 1.23 on the right and 1.20 on the left 11/01/2019 on evaluation today patient appears to be doing fairly well in regard to the wounds on his feet at this point. Fortunately there is no signs of active infection at this time. No fevers, chills, nausea, vomiting, or diarrhea. He currently is seeing infectious disease and still under their care at this point. Subsequently he also has both wounds which she has not been using collagen on as he did not receive that in  his packaging he did not call us and let us know that. Apparently that just was missed on the order. Nonetheless we will get that straightened out today. 8/9-Patient returns for bilateral foot wounds, using Prisma with hydrogel moistened  dressings, and the wounds appear stable. Patient using surgical shoes, avoiding much pressure or weightbearing as much as possible 8/16; patient has bilateral foot wounds. 1 on the right lateral foot proximally the other is on the left mid lateral foot. Both required debridement of callus and thick skin around the wounds. We have been using silver collagen 8/27; patient has bilateral lateral foot wounds. The area on the left substantially surrounded by callus and dry skin. This was removed from the wound edge. The underlying wound is small. The area on the right measured somewhat smaller today. 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 Unfortunately I did not update his antibiotics during today's visit. 9/10 I reviewed Dr. Blair Dolphin last notes he felt he had completed antibiotics his inflammatory markers were reasonably well controlled. He has a small wound on the lateral left foot and a tiny area on the right which is just above closed. He is using Hydrofera Blue with border foam he has bilateral surgical shoes 9/24; 2 week f/u. doing well. right foot is closed. left foot still undermined. 10/14; right foot remains closed at the fifth met head. The area over the base of the left fifth metatarsal has a small open area but considerable undermining towards the plantar foot. Thick callus skin around this suggests an adequate pressure relief. We have talked about this. He says he is going to go back into his cam boot. I suggested a total contact cast he did not seem enamored with this suggestion 10/26; left foot base of the fifth metatarsal. Same condition as last time. He has skin over the area with an  open wound however the skin is not adherent. He went to see Dr. Loreta Ave who did an x-ray and culture of his foot I have not reviewed the x-ray but the patient was not told anything. He is on doxycycline 11/11; since the patient was last here he was in the emergency room on 10/30 he was concerned about swelling in the left foot. They did not do any cultures or x-rays. They changed his antibiotics to cephalexin. Previous culture showed group B strep. The cephalexin is appropriate as doxycycline has less than predictable coverage. Arrives in clinic today with swelling over this area under the wound. He also has a new wound on the right fifth metatarsal head 11/18; the patient has a difficult wound on the lateral aspect of the left fifth metatarsal head. The wound was almost ballotable last week I opened it slightly expecting to see purulence however there was just bleeding. I cultured this this was negative. X-ray unchanged. We are trying to get an MRI but I am not sure were going to be able to get this through his insurance. He also has an area on the right lateral fifth metatarsal head this looks healthier 12/3; the patient finally got our MRI. Surprisingly this did not show osteomyelitis. I did show the soft tissue ulceration at the lateral plantar aspect of the fifth metatarsal base with a tiny residual 6 mm abscess overlying the superficial fascia I have tried to culture this area I have not been able to get this to grow anything. Nevertheless the protruding tissue looks aggravated. I suspect we should try to treat the underlying "abscess with broad-spectrum antibiotics. I am going to start him on Levaquin and Flagyl. He has much less edema in his legs and I am going to continue to wrap his legs and see him  weekly 12/10. I started Levaquin and Flagyl on him last week. He just picked up the Flagyl apparently there was some delay. The worry is the wound on the left fifth metatarsal base which is  substantial and worsening. His foot looks like he inverts at the ankle making this a weightbearing surface. Certainly no improvement in fact I think the measurements of this are somewhat worse. We have been using 12/17; he apparently just got the Levaquin yesterday this is 2 weeks after the fact. He has completed the Flagyl. The area over the left fifth metatarsal base still has protruding granulation tissue although it does not look quite as bad as it did some weeks ago. He has severe bilateral lymphedema although we have not been treating him for wounds on his legs this is definitely going to require compression. There was so much edema in the left I did not wish to put him in a total contact cast today. I am going to increase his compression from 3-4 layer. The area on the right lateral fifth met head actually look quite good and superficial. 12/23; patient arrived with callus on the right fifth met head and the substantial hyper granulated callused wound on the base of his fifth metatarsal. He says he is completing his Levaquin in 2 days but I do not think that adds up with what I gave him but I will have to double check this. We are using Hydrofera Blue on both areas. My plan is to put the left leg in a cast the week after New Year's Objective Constitutional Patient is hypertensive.. Pulse regular and within target range for patient.Marland Kitchen Respirations regular, non-labored and within target range.. Temperature is normal and within the target range for the patient.Marland Kitchen Appears in no distress. Vitals Time Taken: 1:47 PM, Height: 77 in, Weight: 280 lbs, BMI: 33.2, Temperature: 98.9 F, Pulse: 96 bpm, Respiratory Rate: 18 breaths/min, Blood Pressure: 144/88 mmHg. General Notes: Wound exam left lateral fifth metatarsal base. Hyper granulated tissue callus. I removed all of this with a #10 scalpel. Hemostasis with silver nitrate ooThe right fifth metatarsal head had callused over the wound area. I remove  this this is not closed Integumentary (Hair, Skin) Wound #3 status is Open. Original cause of wound was Trauma. The wound is located on the Left,Lateral Foot. The wound measures 2.8cm length x 3.1cm width x 0.8cm depth; 6.817cm^2 area and 5.454cm^3 volume. There is Fat Layer (Subcutaneous Tissue) exposed. There is no tunneling or undermining noted. There is a medium amount of serosanguineous drainage noted. The wound margin is well defined and not attached to the wound base. There is small (1-33%) red, pink, hyper - granulation within the wound bed. There is a large (67-100%) amount of necrotic tissue within the wound bed including Adherent Slough. Wound #4 status is Open. Original cause of wound was Gradually Appeared. The wound is located on the Right,Lateral Foot. The wound measures 1cm length x 2cm width x 0.1cm depth; 1.571cm^2 area and 0.157cm^3 volume. There is no tunneling or undermining noted. There is a none present amount of drainage noted. The wound margin is distinct with the outline attached to the wound base. There is no granulation within the wound bed. There is no necrotic tissue within the wound bed. General Notes: callous over wound. 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 There was a Excisional Skin/Subcutaneous Tissue Debridement with a total area of 9 sq cm performed by [name], MD. With the following instrument(s): Blade, and Forceps to remove Viable and Non-Viable tissue/material. Material removed includes Callus, Subcutaneous Tissue, Slough, Skin: Dermis, Skin:  Epidermis, and Fibrin/Exudate after achieving pain control using Lidocaine 4% T opical Solution. A time out was conducted at 14:14, prior to the start of the procedure. A Moderate amount of bleeding was controlled with Silver Nitrate. The procedure was tolerated well with a pain level of 0 throughout and a pain level of 0 following the procedure. Post Debridement Measurements: 2.8cm length x 3.1cm width x 1cm depth; 6.817cm^3 volume. Character of Wound/Ulcer Post Debridement requires further debridement. Severity of Tissue Post Debridement is: Fat layer exposed. Post procedure Diagnosis Wound #3: Same as Pre-Procedure Pre-procedure diagnosis of Wound #3 is a Diabetic Wound/Ulcer of the Lower Extremity located on the Left,Lateral Foot . There was a Four Layer Compression Therapy Procedure by Rhae Hammock, RN. Post procedure Diagnosis Wound #3: Same as Pre-Procedure Wound #4 Pre-procedure diagnosis of Wound #4 is a Diabetic Wound/Ulcer of the Lower Extremity located on the Right,Lateral Foot .Severity of Tissue Pre Debridement is: Fat layer exposed. There was a Selective/Open Wound Non-Viable Tissue Debridement with a total area of 2 sq cm performed by [name], MD. With the following instrument(s): Blade, and Forceps to remove Non-Viable tissue/material. Material removed includes Callus after achieving pain control using Lidocaine 4% T opical Solution. A time out was conducted at 14:14, prior to the start of the procedure. A Minimum amount of bleeding was controlled with Pressure. The procedure was tolerated well with a pain level of 0 throughout and a pain level of 0 following the procedure. Post Debridement Measurements: 1cm length x 2cm width x 0.1cm depth; 0.157cm^3 volume. Character of Wound/Ulcer Post Debridement is improved. Severity of Tissue Post Debridement is: Fat layer exposed. Post procedure Diagnosis Wound #4: Same as Pre-Procedure Pre-procedure diagnosis of Wound #4 is a  Diabetic Wound/Ulcer of the Lower Extremity located on the Right,Lateral Foot . There was a Four Layer Compression Therapy Procedure by Rhae Hammock, RN. Post procedure Diagnosis Wound #4: Same as Pre-Procedure Plan Follow-up Appointments: Return Appointment in 2 weeks. Nurse Visit: - Wednesday Bathing/ Shower/ Hygiene: May shower with protection but do not get wound dressing(s) wet. - may use cast protectors Edema Control - Lymphedema / SCD / Other: Elevate legs to the level of the heart or above for 30 minutes daily and/or when sitting, a frequency of: Avoid standing for long periods of time. Exercise regularly Off-Loading: Open toe surgical shoe to: - both feet, pad left shoe with felt to offload lateral foot WOUND #3: - Foot Wound Laterality: Left, Lateral Peri-Wound Care: Sween Lotion (Moisturizing lotion) 1 x Per Week/ Discharge Instructions: Apply moisturizing lotion as directed Prim Dressing: Hydrofera Blue Classic Foam, 2x2 in 1 x Per Week/ ary Discharge Instructions: Apply to wound bed as instructed Secondary Dressing: Woven Gauze Sponge, Non-Sterile 4x4 in 1 x Per Week/ Discharge Instructions: Apply over primary dressing as directed. Secondary Dressing: ABD Pad, 5x9 1 x Per Week/ Discharge Instructions: Apply over primary dressing as directed. Secondary Dressing: Felt 2.5 yds x 5.5 in 1 x Per Week/ Discharge Instructions: Apply over primary dressing as callous pad Com  pression Wrap: FourPress (4 layer compression wrap) 1 x Per Week/ Discharge Instructions: apply unna boot first layer at upper portion of lower leg. Apply four layer compression as directed. WOUND #4: - Foot Wound Laterality: Right, Lateral Peri-Wound Care: Sween Lotion (Moisturizing lotion) 1 x Per Week/ Discharge Instructions: Apply moisturizing lotion as directed Prim Dressing: Hydrofera Blue Classic Foam, 2x2 in 1 x Per Week/ ary Discharge Instructions: Apply to wound bed as instructed Secondary  Dressing: Woven Gauze Sponge, Non-Sterile 4x4 in 1 x Per Week/ Discharge Instructions: Apply over primary dressing as directed. Secondary Dressing: ABD Pad, 5x9 1 x Per Week/ Discharge Instructions: Apply over primary dressing as directed. Secondary Dressing: Felt 2.5 yds x 5.5 in 1 x Per Week/ Discharge Instructions: Apply over primary dressing as callous pad Com pression Wrap: FourPress (4 layer compression wrap) 1 x Per Week/ Discharge Instructions: apply unna boot first layer at upper portion of lower leg. Apply four layer compression as directed. 1. Continue with Hydrofera Blue in both wound areas 2. I will review his antibiotics 3. I will put him in a total contact cast on the left the first week in January per Electronic Signature(s) Signed: 03/26/2020 3:50:51 PM By: [name] MD Entered By: [name] on 03/26/2020 14:36:53 -------------------------------------------------------------------------------- SuperBill Details Patient Name: Date of Service: [name], [name] 03/26/2020 Medical Record Number: [id number] Patient Account Number: [id number] Date of Birth/Sex: Treating RN: 08-09-1985 (34 y.o. [name], [name] Primary Care Provider: [name] Other Clinician: Referring Provider: Treating Provider/Extender: [name] in Treatment: 22 Diagnosis Coding ICD-10 Codes Code Description E11.621 Type 2 diabetes mellitus with foot ulcer L97.528 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ity L97.518 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ity Facility Procedures CPT4 Code: [id number] Description: 31281 - DEB SUBQ TISSUE 20 SQ CM/< ICD-10 Diagnosis Description L97.528 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i Modifier: ty Quantity: 1 CPT4 Code: 18867737 Description: 36681 - DEBRIDE WOUND 1ST 20 SQ CM OR < ICD-10 Diagnosis  Description L97.518 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 L97.528 Non-pressure chronic ulcer of other part of left foot with other specified  severi Modifier: 52 ity ty Quantity: 1 Physician Procedures : CPT4 Code Description Modifier [id number] 11042 - WC PHYS SUBQ TISS 20 SQ CM ICD-10 Diagnosis Description L97.528 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ity Quantity: 1 : [id number] - WC PHYS DEBR WO ANESTH 20 SQ CM 59 ICD-10 Diagnosis Description L97.518 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ity L97.528 Non-pressure chronic ulcer of other part of left foot with other  specified severity Quantity: 1 Electronic Signature(s) Signed: 03/26/2020 3:50:51 PM By: [name] MD Entered By: [name] on 03/26/2020 14:39:48

## 2020-03-26 NOTE — Progress Notes (Signed)
[place], [name] ([id number]) Visit Report for 03/26/2020 Arrival Information Details Patient Name: Date of Service: [name] RMSTRO NG, [name] 03/26/2020 1:30 PM Medical Record Number: [id number] Patient Account Number: [id number] Date of Birth/Sex: Treating RN: 02-07-1986 (34 y.o. Max PetitM) Max PaxEpps, Max Primary Care Catlynn Grondahl: Max DillsPolite, Max Other Clinician: Referring [name]: Treating Roxanna Mcever/Extender: [name], Max [name] Weeks in Treatment: 22 Visit Information History Since Last Visit All ordered tests and consults were completed: No Patient Arrived: Ambulatory Added or deleted any medications: No Arrival Time: 13:40 Any new allergies or adverse reactions: No Accompanied By: self Had [name] fall or experienced change in No Transfer Assistance: None activities of daily living that may affect Patient Identification Verified: Yes risk of falls: Secondary Verification Process Completed: Yes Signs or symptoms of abuse/neglect since last visito No Patient Requires Transmission-Based Precautions: No Hospitalized since last visit: No Patient Has Alerts: No Implantable device outside of the clinic excluding No cellular tissue based products placed in the center since last visit: Has Dressing in Place as Prescribed: Yes Has Compression in Place as Prescribed: Yes Pain Present Now: No Electronic Signature(s) Signed: 03/26/2020 3:12:05 PM By: Max PaxEpps, Carrie RN Entered By: Max PaxEpps, Max on 03/26/2020 13:46:44 -------------------------------------------------------------------------------- Compression Therapy Details Patient Name: Date of Service: [name] RMSTRO NG, [name] 03/26/2020 1:30 PM Medical Record Number: [id number] Patient Account Number: [id number] Date of Birth/Sex: Treating RN: 02-07-1986 (34 y.o. Max SoursM) [name] Primary Care Kloey Cazarez: Max DillsPolite, Max Other Clinician: Referring Genesis [name]: Treating [name]/Extender: [name], Max [name] Weeks in Treatment: 22 Compression  Therapy Performed for Wound Assessment: Wound #3 Left,Lateral Foot Performed By: Clinician [name], [name], RN Compression Type: Four Layer Post Procedure Diagnosis Same as Pre-procedure Electronic Signature(s) Signed: 03/26/2020 4:19:56 PM By: [name], Max Entered By: [name], Max on 03/26/2020 14:25:06 -------------------------------------------------------------------------------- Compression Therapy Details Patient Name: Date of Service: [name] RMSTRO NG, [name] 03/26/2020 1:30 PM Medical Record Number: [id number] Patient Account Number: [id number] Date of Birth/Sex: Treating RN: 02-07-1986 (34 y.o. Max SoursM) [name] Primary Care [name]: Max DillsPolite, Max Other Clinician: Referring Sharla Tankard: Treating [name]/Extender: [name], Max [name] Weeks in Treatment: 22 Compression Therapy Performed for Wound Assessment: Wound #4 Right,Lateral Foot Performed By: Clinician [name], [name], RN Compression Type: Four Layer Post Procedure Diagnosis Same as Pre-procedure Electronic Signature(s) Signed: 03/26/2020 4:19:56 PM By: [name], Max Entered By: [name], Max on 03/26/2020 14:25:06 -------------------------------------------------------------------------------- Encounter Discharge Information Details Patient Name: Date of Service: [name] RMSTRO NG, [name] 03/26/2020 1:30 PM Medical Record Number: [id number] Patient Account Number: [id number] Date of Birth/Sex: Treating RN: 02-07-1986 (34 y.o. Max SoursM) [name] Primary Care [name]: Max DillsPolite, Max Other Clinician: Referring [name]: Treating [name]/Extender: [name], Max [name] Weeks in Treatment: 22 Encounter Discharge Information Items Post Procedure Vitals Discharge Condition: Stable Temperature (F): 98.9 Ambulatory Status: Ambulatory Pulse (bpm): 96 Discharge Destination: Home Respiratory Rate (breaths/min): 18 Transportation: Private Auto Blood Pressure (mmHg): 144/88 Accompanied By:  self Schedule Follow-up Appointment: Yes Clinical Summary of Care: Patient Declined Electronic Signature(s) Signed: 03/26/2020 3:16:40 PM By: [name], Max EMT/[name] Entered By: [name], Max on 03/26/2020 15:16:26 -------------------------------------------------------------------------------- Lower Extremity Assessment Details Patient Name: Date of Service: [name] RMSTRO NG, [name] 03/26/2020 1:30 PM Medical Record Number: [id number] Patient Account Number: [id number] Date of Birth/Sex: Treating RN: 02-07-1986 (34 y.o. Max [place]M) [name] Primary Care [name]: Max DillsPolite, Max Other Clinician: Referring [name]: Treating [name]/Extender: [name], Max [name] Weeks in Treatment: 22 Edema Assessment Assessed: [name][Left: No] [Right: No] Edema: [Left: No] [Right: No] Calf Left: Right: Point of Measurement: 48 cm From Medial Instep 45 cm  44 cm Ankle Left: Right: Point of Measurement: 10 cm From Medial Instep 32 cm 29 cm Electronic Signature(s) Signed: 03/26/2020 3:12:05 PM By: Max Pax RN Entered By: [name] on 03/26/2020 13:51:15 -------------------------------------------------------------------------------- Multi Wound Chart Details Patient Name: Date of Service: [name], [name] 03/26/2020 1:30 PM Medical Record Number: [id number] Patient Account Number: [id number] Date of Birth/Sex: Treating RN: [birth year] (34 y.o. [name] Primary Care Henya Aguallo: [name] Other Clinician: Referring Tarshia Kot: Treating [name]/Extender: Max Revels in Treatment: 22 Vital Signs Height(in): 77 Pulse(bpm): 96 Weight(lbs): 280 Blood Pressure(mmHg): 144/88 Body Mass Index(BMI): 33 Temperature(F): 98.9 Respiratory Rate(breaths/min): 18 Photos: [3:No Photos Left, Lateral Foot] [4:No Photos Right, Lateral Foot] [N/[name]:N/[name] N/[name]] Wound Location: [3:Trauma] [4:Gradually Appeared] [N/[name]:N/[name]] Wounding Event: [3:Diabetic Wound/Ulcer of the  Lower] [4:Diabetic Wound/Ulcer of the Lower] [N/[name]:N/[name]] Primary Etiology: [3:Extremity Type II Diabetes] [4:Extremity Type II Diabetes] [N/[name]:N/[name]] Comorbid History: [3:10/02/2019] [4:02/06/2020] [N/[name]:N/[name]] Date Acquired: [3:22] [4:6] [N/[name]:N/[name]] Weeks of Treatment: [3:Open] [4:Open] [N/[name]:N/[name]] Wound Status: [3:2.8x3.1x0.8] [4:1x2x0.1] [N/[name]:N/[name]] Measurements L x W x [name] [3:6.817] [4:1.571] [N/[name]:N/[name]] [name] : rea [3:5.454] [4:0.157] [N/[name]:N/[name]] Volume (cm) : [3:-313.40%] [4:-70.90%] [N/[name]:N/[name]] % Reduction in [name] [3:rea: -3205.50%] [4:-70.70%] [N/[name]:N/[name]] % Reduction in Volume: [3:Grade 2] [4:Grade 2] [N/[name]:N/[name]] Classification: [3:Medium] [4:None Present] [N/[name]:N/[name]] Exudate [name] mount: [3:Serosanguineous] [4:N/[name]] [N/[name]:N/[name]] Exudate Type: [3:red, brown] [4:N/[name]] [N/[name]:N/[name]] Exudate Color: [3:Well defined, not attached] [4:Distinct, outline attached] [N/[name]:N/[name]] Wound Margin: [3:Small (1-33%)] [4:None Present (0%)] [N/[name]:N/[name]] Granulation [name] mount: [3:Red, Pink, Hyper-granulation] [4:N/[name]] [N/[name]:N/[name]] Granulation Quality: [3:Large (67-100%)] [4:None Present (0%)] [N/[name]:N/[name]] Necrotic [name] mount: [3:Fat Layer (Subcutaneous Tissue): Yes Fascia: No] [N/[name]:N/[name]] Exposed Structures: [3:Fascia: No Tendon: No Muscle: No Joint: No Bone: No None] [4:Fat Layer (Subcutaneous Tissue): No Tendon: No Muscle: No Joint: No Bone: No Large (67-100%)] [N/[name]:N/[name]] Epithelialization: [3:Debridement - Excisional] [4:Debridement - Selective/Open Wound] [N/[name]:N/[name]] Debridement: Pre-procedure Verification/Time Out 14:14 [4:14:14] [N/[name]:N/[name]] Taken: [3:Lidocaine 4% Topical Solution] [4:Lidocaine 4% Topical Solution] [N/[name]:N/[name]] Pain Control: [3:Callus, Subcutaneous, Slough] [4:Callus] [N/[name]:N/[name]] Tissue Debrided: [3:Skin/Subcutaneous Tissue] [4:Non-Viable Tissue] [N/[name]:N/[name]] Level: [3:9] [4:2] [N/[name]:N/[name]] Debridement [name] (sq cm): [3:rea Blade, Forceps] [4:Blade, Forceps] [N/[name]:N/[name]] Instrument: [3:Moderate] [4:Minimum] [N/[name]:N/[name]] Bleeding: [3:Silver Nitrate]  [4:Pressure] [N/[name]:N/[name]] Hemostasis Achieved: [3:0] [4:0] [N/[name]:N/[name]] Procedural Pain: [3:0] [4:0] [N/[name]:N/[name]] Post Procedural Pain: [3:Procedure was tolerated well] [4:Procedure was tolerated well] [N/[name]:N/[name]] Debridement Treatment Response: [3:2.8x3.1x1] [4:1x2x0.1] [N/[name]:N/[name]] Post Debridement Measurements L x W x [name] [3:6.817] [4:0.157] [N/[name]:N/[name]] Post Debridement Volume: (cm) [3:N/[name]] [4:callous over wound.] [N/[name]:N/[name]] Assessment Notes: [3:Compression Therapy] [4:Compression Therapy] [N/[name]:N/[name]] Procedures Performed: [3:Debridement] [4:Debridement] Treatment Notes Electronic Signature(s) Signed: 03/26/2020 3:50:51 PM By: Baltazar Najjar MD Signed: 03/26/2020 4:19:56 PM By: Max Stall Entered By: Baltazar Najjar on 03/26/2020 14:31:53 -------------------------------------------------------------------------------- Multi-Disciplinary Care Plan Details Patient Name: Date of Service: [name] NG, [name] 03/26/2020 1:30 PM Medical Record Number: [id number] Patient Account Number: [id number] Date of Birth/Sex: Treating RN: 1985/05/03 (34 y.o. [name] Primary Care Kohana Amble: [name] Other Clinician: Referring Eshaal Duby: Treating Rickayla Wieland/Extender: Max Revels in Treatment: 22 Active Inactive Nutrition Nursing Diagnoses: Imbalanced nutrition Potential for alteratiion in Nutrition/Potential for imbalanced nutrition Goals: Patient/caregiver agrees to and verbalizes understanding of need to use nutritional supplements and/or vitamins as prescribed Date Initiated: 10/24/2019 Target Resolution Date: 04/03/2020 Goal Status: Active Patient/caregiver will maintain therapeutic glucose control Date Initiated: 10/24/2019 Target Resolution Date: 04/03/2020 Goal Status: Active Interventions: Assess HgA1c results as ordered upon admission and as needed Assess patient nutrition upon admission and as needed per policy Provide education on elevated blood sugars  and impact  on wound healing Provide education on nutrition Treatment Activities: Education provided on Nutrition : 12/13/2019 Notes: Wound/Skin Impairment Nursing Diagnoses: Impaired tissue integrity Knowledge deficit related to ulceration/compromised skin integrity Goals: Patient/caregiver will verbalize understanding of skin care regimen Date Initiated: 10/24/2019 Target Resolution Date: 04/03/2020 Goal Status: Active Ulcer/skin breakdown will have [name] volume reduction of 30% by week 4 Date Initiated: 10/24/2019 Date Inactivated: 01/16/2020 Target Resolution Date: 01/10/2020 Unmet Reason: no change in Goal Status: Unmet measurements. Interventions: Assess patient/caregiver ability to obtain necessary supplies Assess patient/caregiver ability to perform ulcer/skin care regimen upon admission and as needed Assess ulceration(s) every visit Provide education on ulcer and skin care Notes: Electronic Signature(s) Signed: 03/26/2020 4:19:56 PM By: Max Stall Entered By: Max Stall on 03/26/2020 14:26:20 -------------------------------------------------------------------------------- Pain Assessment Details Patient Name: Date of Service: [name] 03/26/2020 1:30 PM Medical Record Number: [id number] Patient Account Number: [id number] Date of Birth/Sex: Treating RN: July 31, 1985 (34 y.o. [name] Primary Care Ayyan Sites: [name] Other Clinician: Referring Levetta Bognar: Treating Azyah Flett/Extender: Max Revels in Treatment: 22 Active Problems Location of Pain Severity and Description of Pain Patient Has Paino No Site Locations Pain Management and Medication Current Pain Management: Electronic Signature(s) Signed: 03/26/2020 3:12:05 PM By: Max Pax RN Entered By: [name] on 03/26/2020 13:48:36 -------------------------------------------------------------------------------- Patient/Caregiver Education Details Patient  Name: Date of Service: [name] 12/23/2021andnbsp1:30 PM Medical Record Number: [id number] Patient Account Number: [id number] Date of Birth/Gender: Treating RN: 05-Jun-1985 (34 y.o. [name] Primary Care Physician: [name] Other Clinician: Referring Physician: Treating Physician/Extender: Max Revels in Treatment: 22 Education Assessment Education Provided To: Patient Education Topics Provided Elevated Blood Sugar/ Impact on Healing: Handouts: Elevated Blood Sugars: How Do They Affect Wound Healing Methods: Explain/Verbal, Printed Responses: Reinforcements needed Electronic Signature(s) Signed: 03/26/2020 4:19:56 PM By: Max Stall Entered By: Max Stall on 03/26/2020 14:26:30 -------------------------------------------------------------------------------- Wound Assessment Details Patient Name: Date of Service: [name] 03/26/2020 1:30 PM Medical Record Number: [id number] Patient Account Number: [id number] Date of Birth/Sex: Treating RN: [birth year] (34 y.o. [name]) [name] Primary Care Elya Tarquinio: [name] Other Clinician: Referring Cornelious Bartolucci: Treating Pamella Samons/Extender: Max Revels in Treatment: 22 Wound Status Wound Number: 3 Primary Etiology: Diabetic Wound/Ulcer of the Lower Extremity Wound Location: Left, Lateral Foot Wound Status: Open Wounding Event: Trauma Comorbid History: Type II Diabetes Date Acquired: 10/02/2019 Weeks Of Treatment: 22 Clustered Wound: No Wound Measurements Length: (cm) 2.8 Width: (cm) 3.1 Depth: (cm) 0.8 Area: (cm) 6.817 Volume: (cm) 5.454 % Reduction in Area: -313.4% % Reduction in Volume: -3205.5% Epithelialization: None Tunneling: No Undermining: No Wound Description Classification: Grade 2 Wound Margin: Well defined, not attached Exudate Amount: Medium Exudate Type: Serosanguineous Exudate Color: red, brown Foul Odor After  Cleansing: No Slough/Fibrino Yes Wound Bed Granulation Amount: Small (1-33%) Exposed Structure Granulation Quality: Red, Pink, Hyper-granulation Fascia Exposed: No Necrotic Amount: Large (67-100%) Fat Layer (Subcutaneous Tissue) Exposed: Yes Necrotic Quality: Adherent Slough Tendon Exposed: No Muscle Exposed: No Joint Exposed: No Bone Exposed: No Treatment Notes Wound #3 (Foot) Wound Laterality: Left, Lateral Cleanser Peri-Wound Care Sween Lotion (Moisturizing lotion) Discharge Instruction: Apply moisturizing lotion as directed Topical Primary Dressing Hydrofera Blue Classic Foam, 2x2 in Discharge Instruction: Apply to wound bed as instructed Secondary Dressing Woven Gauze Sponge, Non-Sterile 4x4 in Discharge Instruction: Apply over primary dressing as directed. ABD Pad, 5x9 Discharge Instruction: Apply over primary dressing as directed. Felt 2.5 yds x 5.5 in Discharge Instruction: Apply  over primary dressing as callous pad Secured With Compression Wrap FourPress (4 layer compression wrap) Discharge Instruction: apply unna boot first layer at upper portion of lower leg. Apply four layer compression as directed. Compression Stockings Add-Ons Electronic Signature(s) Signed: 03/26/2020 3:12:05 PM By: Max Pax RN Entered By: [name] on 03/26/2020 13:53:23 -------------------------------------------------------------------------------- Wound Assessment Details Patient Name: Date of Service: [name] 03/26/2020 1:30 PM Medical Record Number: [id number] Patient Account Number: [id number] Date of Birth/Sex: Treating RN: 1985/12/10 (34 y.o. [name] Primary Care Rylee Nuzum: [name] Other Clinician: Referring Cornie Herrington: Treating Marabeth Melland/Extender: Max Revels in Treatment: 22 Wound Status Wound Number: 4 Primary Etiology: Diabetic Wound/Ulcer of the Lower Extremity Wound Location: Right, Lateral Foot Wound Status:  Open Wounding Event: Gradually Appeared Comorbid History: Type II Diabetes Date Acquired: 02/06/2020 Weeks Of Treatment: 6 Clustered Wound: No Wound Measurements Length: (cm) 1 Width: (cm) 2 Depth: (cm) 0.1 Area: (cm) 1.571 Volume: (cm) 0.157 % Reduction in Area: -70.9% % Reduction in Volume: -70.7% Epithelialization: Large (67-100%) Tunneling: No Undermining: No Wound Description Classification: Grade 2 Wound Margin: Distinct, outline attached Exudate Amount: None Present Foul Odor After Cleansing: No Slough/Fibrino No Wound Bed Granulation Amount: None Present (0%) Exposed Structure Necrotic Amount: None Present (0%) Fascia Exposed: No Fat Layer (Subcutaneous Tissue) Exposed: No Tendon Exposed: No Muscle Exposed: No Joint Exposed: No Bone Exposed: No Assessment Notes callous over wound. Treatment Notes Wound #4 (Foot) Wound Laterality: Right, Lateral Cleanser Peri-Wound Care Sween Lotion (Moisturizing lotion) Discharge Instruction: Apply moisturizing lotion as directed Topical Primary Dressing Hydrofera Blue Classic Foam, 2x2 in Discharge Instruction: Apply to wound bed as instructed Secondary Dressing Woven Gauze Sponge, Non-Sterile 4x4 in Discharge Instruction: Apply over primary dressing as directed. ABD Pad, 5x9 Discharge Instruction: Apply over primary dressing as directed. Felt 2.5 yds x 5.5 in Discharge Instruction: Apply over primary dressing as callous pad Secured With Compression Wrap FourPress (4 layer compression wrap) Discharge Instruction: apply unna boot first layer at upper portion of lower leg. Apply four layer compression as directed. Compression Stockings Add-Ons Electronic Signature(s) Signed: 03/26/2020 4:19:56 PM By: Max Stall Entered By: Max Stall on 03/26/2020 14:24:14 -------------------------------------------------------------------------------- Vitals Details Patient Name: Date of Service: [name], [name]  03/26/2020 1:30 PM Medical Record Number: [id number] Patient Account Number: [id number] Date of Birth/Sex: Treating RN: 1985/12/09 (34 y.o. [name]) [name] Primary Care Iliyah Bui: [name] Other Clinician: Referring Zenobia Kuennen: Treating Martin Smeal/Extender: Max Revels in Treatment: 22 Vital Signs Time Taken: 13:47 Temperature (F): 98.9 Height (in): 77 Pulse (bpm): 96 Weight (lbs): 280 Respiratory Rate (breaths/min): 18 Body Mass Index (BMI): 33.2 Blood Pressure (mmHg): 144/88 Reference Range: 80 - 120 mg / dl Electronic Signature(s) Signed: 03/26/2020 3:12:05 PM By: Max Pax RN Entered By: [name] on 03/26/2020 13:47:26

## 2020-04-01 ENCOUNTER — Encounter (HOSPITAL_BASED_OUTPATIENT_CLINIC_OR_DEPARTMENT_OTHER): Payer: BC Managed Care – PPO | Admitting: Physician Assistant

## 2020-04-01 ENCOUNTER — Other Ambulatory Visit: Payer: Self-pay

## 2020-04-01 DIAGNOSIS — E11621 Type 2 diabetes mellitus with foot ulcer: Secondary | ICD-10-CM | POA: Diagnosis not present

## 2020-04-01 NOTE — Progress Notes (Signed)
[place], [name] ([id number]) Visit Report for 04/01/2020 Arrival Information Details Patient Name: Date of Service: [name] D 04/01/2020 9:00 A M Medical Record Number: [id number] Patient Account Number: [id number] Date of Birth/Sex: Treating RN: Mar 18, 1986 (34 y.o. Charlean Merl, [name] Primary Care Salsabeel Gorelick: [name] Other Clinician: Referring Takisha Pelle: Treating Leomia Blake/Extender: Prudencio Burly in Treatment: 22 Visit Information History Since Last Visit Added or deleted any medications: No Patient Arrived: Ambulatory Any new allergies or adverse reactions: No Arrival Time: 10:04 Had a fall or experienced change in No Accompanied By: self activities of daily living that may affect Transfer Assistance: None risk of falls: Patient Identification Verified: Yes Signs or symptoms of abuse/neglect since last visito No Secondary Verification Process Completed: Yes Hospitalized since last visit: No Patient Requires Transmission-Based Precautions: No Implantable device outside of the clinic excluding No Patient Has Alerts: No cellular tissue based products placed in the center since last visit: Has Dressing in Place as Prescribed: Yes Has Compression in Place as Prescribed: Yes Pain Present Now: No Electronic Signature(s) Signed: 04/01/2020 2:09:35 PM By: Fonnie Mu RN Entered By: Fonnie Mu on 04/01/2020 10:05:28 -------------------------------------------------------------------------------- Compression Therapy Details Patient Name: Date of Service: [name], [name] 04/01/2020 9:00 A M Medical Record Number: [id number] Patient Account Number: [id number] Date of Birth/Sex: Treating RN: 1985/04/16 (34 y.o. Lucious Groves Primary Care Elizeo Rodriques: [name] Other Clinician: Referring Curly Mackowski: Treating Tesa Meadors/Extender: Prudencio Burly in Treatment: 22 Compression Therapy Performed for Wound  Assessment: Wound #3 Left,Lateral Foot Performed By: Clinician Fonnie Mu, RN Compression Type: Four Layer Electronic Signature(s) Signed: 04/01/2020 2:09:35 PM By: Fonnie Mu RN Entered By: Fonnie Mu on 04/01/2020 10:08:06 -------------------------------------------------------------------------------- Compression Therapy Details Patient Name: Date of Service: [name], [name] 04/01/2020 9:00 A M Medical Record Number: [id number] Patient Account Number: [id number] Date of Birth/Sex: Treating RN: 1985/06/26 (34 y.o. Lucious Groves Primary Care Gurbani Figge: [name] Other Clinician: Referring Stanford Strauch: Treating Marvella Jenning/Extender: Prudencio Burly in Treatment: 22 Compression Therapy Performed for Wound Assessment: Wound #4 Right,Lateral Foot Performed By: Clinician Fonnie Mu, RN Compression Type: Four Layer Electronic Signature(s) Signed: 04/01/2020 2:09:35 PM By: Fonnie Mu RN Entered By: Fonnie Mu on 04/01/2020 10:08:07 -------------------------------------------------------------------------------- Encounter Discharge Information Details Patient Name: Date of Service: [name], [name] 04/01/2020 9:00 A M Medical Record Number: [id number] Patient Account Number: [id number] Date of Birth/Sex: Treating RN: 1985/11/07 (34 y.o. Lucious Groves Primary Care Graceland Wachter: [name] Other Clinician: Referring Tarez Bowns: Treating Pat Sires/Extender: Prudencio Burly in Treatment: 22 Encounter Discharge Information Items Discharge Condition: Stable Ambulatory Status: Ambulatory Discharge Destination: Home Transportation: Private Auto Accompanied By: self Schedule Follow-up Appointment: Yes Clinical Summary of Care: Patient Declined Electronic Signature(s) Signed: 04/01/2020 2:09:35 PM By: Fonnie Mu RN Entered By: Fonnie Mu on 04/01/2020  10:09:00 -------------------------------------------------------------------------------- Patient/Caregiver Education Details Patient Name: Date of Service: [name], [name] 12/29/2021andnbsp9:00 A M Medical Record Number: [id number] Patient Account Number: [id number] Date of Birth/Gender: Treating RN: April 28, 1985 (34 y.o. Lucious Groves Primary Care Physician: [name] Other Clinician: Referring Physician: Treating Physician/Extender: Prudencio Burly in Treatment: 22 Education Assessment Education Provided To: Patient Education Topics Provided Wound/Skin Impairment: Methods: Explain/Verbal Responses: State content correctly [employer]) Signed: 04/01/2020 2:09:35 PM By: Fonnie Mu RN Entered By: Fonnie Mu on 04/01/2020 10:08:36 -------------------------------------------------------------------------------- Wound Assessment Details Patient Name: Date of Service: [name], [name] 04/01/2020 9:00 A M  Medical Record Number: [id number] Patient Account Number: [id number] Date of Birth/Sex: Treating RN: 03-Apr-1986 (34 y.o. Charlean Merl, [name] Primary Care Kariyah Baugh: [name] Other Clinician: Referring Charlissa Petros: Treating Dori Devino/Extender: Prudencio Burly in Treatment: 22 Wound Status Wound Number: 3 Primary Etiology: Diabetic Wound/Ulcer of the Lower Extremity Wound Location: Left, Lateral Foot Wound Status: Open Wounding Event: Trauma Date Acquired: 10/02/2019 Weeks Of Treatment: 22 Clustered Wound: No Wound Measurements Length: (cm) 2.8 Width: (cm) 3.1 Depth: (cm) 0.8 Area: (cm) 6.817 Volume: (cm) 5.454 % Reduction in Area: -313.4% % Reduction in Volume: -3205.5% Wound Description Classification: Grade 2 Treatment Notes Wound #3 (Foot) Wound Laterality: Left, Lateral Cleanser Peri-Wound Care Sween Lotion (Moisturizing lotion) Discharge Instruction: Apply moisturizing  lotion as directed Topical Primary Dressing Hydrofera Blue Classic Foam, 2x2 in Discharge Instruction: Apply to wound bed as instructed Secondary Dressing Woven Gauze Sponge, Non-Sterile 4x4 in Discharge Instruction: Apply over primary dressing as directed. ABD Pad, 5x9 Discharge Instruction: Apply over primary dressing as directed. Felt 2.5 yds x 5.5 in Discharge Instruction: Apply over primary dressing as callous pad Secured With Compression Wrap FourPress (4 layer compression wrap) Discharge Instruction: apply unna boot first layer at upper portion of lower leg. Apply four layer compression as directed. Compression Stockings Add-Ons Electronic Signature(s) Signed: 04/01/2020 2:09:35 PM By: Fonnie Mu RN Entered By: Fonnie Mu on 04/01/2020 10:06:45 -------------------------------------------------------------------------------- Wound Assessment Details Patient Name: Date of Service: [name], [name] 04/01/2020 9:00 A M Medical Record Number: [id number] Patient Account Number: [id number] Date of Birth/Sex: Treating RN: Nov 09, 1985 (34 y.o. Charlean Merl, [name] Primary Care Brenisha Tsui: [name] Other Clinician: Referring Samyuktha Brau: Treating Marybell Robards/Extender: Prudencio Burly in Treatment: 22 Wound Status Wound Number: 4 Primary Etiology: Diabetic Wound/Ulcer of the Lower Extremity Wound Location: Right, Lateral Foot Wound Status: Open Wounding Event: Gradually Appeared Date Acquired: 02/06/2020 Weeks Of Treatment: 6 Clustered Wound: No Wound Measurements Length: (cm) 1 Width: (cm) 2 Depth: (cm) 0.1 Area: (cm) 1.571 Volume: (cm) 0.157 % Reduction in Area: -70.9% % Reduction in Volume: -70.7% Wound Description Classification: Grade 2 Treatment Notes Wound #4 (Foot) Wound Laterality: Right, Lateral Cleanser Peri-Wound Care Sween Lotion (Moisturizing lotion) Discharge Instruction: Apply moisturizing lotion as  directed Topical Primary Dressing Hydrofera Blue Classic Foam, 2x2 in Discharge Instruction: Apply to wound bed as instructed Secondary Dressing Woven Gauze Sponge, Non-Sterile 4x4 in Discharge Instruction: Apply over primary dressing as directed. ABD Pad, 5x9 Discharge Instruction: Apply over primary dressing as directed. Felt 2.5 yds x 5.5 in Discharge Instruction: Apply over primary dressing as callous pad Secured With Compression Wrap FourPress (4 layer compression wrap) Discharge Instruction: apply unna boot first layer at upper portion of lower leg. Apply four layer compression as directed. Compression Stockings Add-Ons Electronic Signature(s) Signed: 04/01/2020 2:09:35 PM By: Fonnie Mu RN Signed: 04/01/2020 2:09:35 PM By: Fonnie Mu RN Entered By: Fonnie Mu on 04/01/2020 10:06:46 -------------------------------------------------------------------------------- Vitals Details Patient Name: Date of Service: [name], [name] 04/01/2020 9:00 A M Medical Record Number: [id number] Patient Account Number: [id number] Date of Birth/Sex: Treating RN: 08/17/1985 (34 y.o. Charlean Merl, [name] Primary Care Aubria Vanecek: [name] Other Clinician: Referring Junah Yam: Treating Trentyn Boisclair/Extender: Prudencio Burly in Treatment: 22 Vital Signs Time Taken: 10:05 Temperature (F): 98.4 Height (in): 77 Pulse (bpm): 96 Weight (lbs): 280 Respiratory Rate (breaths/min): 18 Body Mass Index (BMI): 33.2 Blood Pressure (mmHg): 137/89 Reference Range: 80 - 120 mg / dl Electronic Signature(s) Signed: 04/01/2020 2:09:35 PM By:  Fonnie Mu RN Entered By: Fonnie Mu on 04/01/2020 10:06:03

## 2020-04-06 ENCOUNTER — Other Ambulatory Visit: Payer: Self-pay

## 2020-04-06 ENCOUNTER — Encounter (HOSPITAL_BASED_OUTPATIENT_CLINIC_OR_DEPARTMENT_OTHER): Payer: BC Managed Care – PPO | Attending: Internal Medicine | Admitting: Internal Medicine

## 2020-04-06 DIAGNOSIS — E11621 Type 2 diabetes mellitus with foot ulcer: Secondary | ICD-10-CM | POA: Diagnosis not present

## 2020-04-06 DIAGNOSIS — Z8616 Personal history of COVID-19: Secondary | ICD-10-CM | POA: Diagnosis not present

## 2020-04-06 DIAGNOSIS — Z89422 Acquired absence of other left toe(s): Secondary | ICD-10-CM | POA: Insufficient documentation

## 2020-04-06 DIAGNOSIS — L97528 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ity: Secondary | ICD-10-CM | POA: Insufficient documentation

## 2020-04-07 NOTE — Progress Notes (Signed)
[place], [name] ([id number]) Visit Report for 04/06/2020 Arrival Information Details Patient Name: Date of Service: [name] 04/06/2020 12:45 PM Medical Record Number: [id number] Patient Account Number: [id number] Date of Birth/Sex: Treating RN: Jul 21, 1985 (35 y.o. [name], [name] Primary Care Miracle Mongillo: [name] Other Clinician: Referring Nesiah Jump: Treating Chrisette Man/Extender: [name] in Treatment: 100 Visit Information History Since Last Visit Added or deleted any medications: No Patient Arrived: Ambulatory Any new allergies or adverse reactions: No Arrival Time: 12:32 Had a fall or experienced change in No Accompanied By: self activities of daily living that may affect Transfer Assistance: None risk of falls: Patient Identification Verified: Yes Signs or symptoms of abuse/neglect since last visito No Secondary Verification Process Completed: Yes Hospitalized since last visit: No Patient Requires Transmission-Based Precautions: No Implantable device outside of the clinic excluding No Patient Has Alerts: No cellular tissue based products placed in the center since last visit: Has Dressing in Place as Prescribed: Yes Pain Present Now: No Electronic Signature(s) Signed: 04/07/2020 5:38:12 PM By: Rhae Hammock RN Entered By: Rhae Hammock on 04/06/2020 12:34:30 -------------------------------------------------------------------------------- Compression Therapy Details Patient Name: Date of Service: [name], [name] 04/06/2020 12:45 PM Medical Record Number: [id number] Patient Account Number: [id number] Date of Birth/Sex: Treating RN: 07-27-1985 (35 y.o. [name] Primary Care Jaion Lagrange: [name] Other Clinician: Referring Labradford Schnitker: Treating Ellise Kovack/Extender: [name] in Treatment: 23 Compression Therapy Performed for Wound Assessment: Wound #4 Right,Lateral Foot Performed By:  Clinician [name], RN Compression Type: Three Layer Post Procedure Diagnosis Same as Pre-procedure Electronic Signature(s) Signed: 04/07/2020 5:50:13 PM By: [name] RN, BSN Entered By: [name] on 04/06/2020 13:25:38 -------------------------------------------------------------------------------- Encounter Discharge Information Details Patient Name: Date of Service: [name], [name] 04/06/2020 12:45 PM Medical Record Number: [id number] Patient Account Number: [id number] Date of Birth/Sex: Treating RN: 1985-07-19 (35 y.o. [name] Primary Care Kesean Serviss: [name] Other Clinician: Referring Kolin Erdahl: Treating Yolunda Kloos/Extender: [name] in Treatment: 23 Encounter Discharge Information Items Post Procedure Vitals Discharge Condition: Stable Temperature (F): 97.6 Ambulatory Status: Ambulatory Pulse (bpm): 90 Discharge Destination: Home Respiratory Rate (breaths/min): 18 Transportation: Private Auto Blood Pressure (mmHg): 155/96 Accompanied By: self Schedule Follow-up Appointment: Yes Clinical Summary of Care: Patient Declined Electronic Signature(s) Signed: 04/06/2020 2:31:34 PM By: Baruch Gouty RN, BSN Entered By: Baruch Gouty on 04/06/2020 14:30:59 -------------------------------------------------------------------------------- Lower Extremity Assessment Details Patient Name: Date of Service: [name], [name] 04/06/2020 12:45 PM Medical Record Number: [id number] Patient Account Number: [id number] Date of Birth/Sex: Treating RN: 28-Aug-1985 (35 y.o. [name] Primary Care Kerby Hockley: [name] Other Clinician: Referring Jette Lewan: Treating Mckynzie Liwanag/Extender: [name] in Treatment: 23 Edema Assessment Assessed: [name]: No] [name]: No] Edema: [Left: Yes] [Right: Yes] Calf Left: Right: Point of Measurement: 48 cm From Medial Instep 46 cm 47 cm Ankle Left:  Right: Point of Measurement: 10 cm From Medial Instep 26.5 cm 27 cm Vascular Assessment Pulses: Dorsalis Pedis Palpable: [Left:Yes] [Right:Yes] Posterior Tibial Palpable: [Left:Yes] [Right:Yes] Electronic Signature(s) Signed: 04/07/2020 5:38:12 PM By: Rhae Hammock RN Entered By: Rhae Hammock on 04/06/2020 13:05:20 -------------------------------------------------------------------------------- Multi Wound Chart Details Patient Name: Date of Service: [name], [name] 04/06/2020 12:45 PM Medical Record Number: [id number] Patient Account Number: [id number] Date of Birth/Sex: Treating RN: [birth year] (35 y.o. [name] Primary Care Hiromi Knodel: [name] Other Clinician: Referring Alejandrina Raimer: Treating Madelaine Whipple/Extender: [name] in Treatment: 23 Vital Signs Height(in): 77 Capillary Blood  Glucose(mg/dl): 130 Weight(lbs): 280 Pulse(bpm): 90 Body Mass Index(BMI): 33 Blood Pressure(mmHg): 155/96 Temperature(F): 97.6 Respiratory Rate(breaths/min): 17 Photos: [3:No Photos Left, Lateral Foot] [4:No Photos Right, Lateral Foot] [N/A:N/A N/A] Wound Location: [3:Trauma] [4:Gradually Appeared] [N/A:N/A] Wounding Event: [3:Diabetic Wound/Ulcer of the Lower] [4:Diabetic Wound/Ulcer of the Lower] [N/A:N/A] Primary Etiology: [3:Extremity Type II Diabetes] [4:Extremity Type II Diabetes] [N/A:N/A] Comorbid History: [3:10/02/2019] [4:02/06/2020] [N/A:N/A] Date Acquired: [3:23] [4:7] [N/A:N/A] Weeks of Treatment: [3:Open] [4:Open] [N/A:N/A] Wound Status: [3:2.8x3x1] [4:1.2x1x0.1] [N/A:N/A] Measurements L x W x [name] [3:6.597] [4:0.942] [N/A:N/A] A (cm) : rea [3:6.597] [4:0.094] [N/A:N/A] Volume (cm) : [3:-300.10%] [4:-2.50%] [N/A:N/A] % Reduction in A [3:rea: -3898.20%] [4:-2.20%] [N/A:N/A] % Reduction in Volume: [3:7] Starting Position 1 (o'clock): [3:11] Ending Position 1 (o'clock): [3:1.5] Maximum Distance 1 (cm): [3:Yes] [4:No]  [N/A:N/A] Undermining: [3:Grade 2] [4:Grade 2] [N/A:N/A] Classification: [3:Medium] [4:Medium] [N/A:N/A] Exudate A mount: [3:Serosanguineous] [4:Serosanguineous] [N/A:N/A] Exudate Type: [3:red, brown] [4:red, brown] [N/A:N/A] Exudate Color: [3:Distinct, outline attached] [4:Distinct, outline attached] [N/A:N/A] Wound Margin: [3:Large (67-100%)] [4:Large (67-100%)] [N/A:N/A] Granulation A mount: [3:Red, Pink] [4:Red, Pink] [N/A:N/A] Granulation Quality: [3:Small (1-33%)] [4:None Present (0%)] [N/A:N/A] Necrotic A mount: [3:Fat Layer (Subcutaneous Tissue): Yes Fat Layer (Subcutaneous Tissue): Yes N/A] Exposed Structures: [3:Fascia: No Tendon: No Muscle: No Joint: No Bone: No N/A] [4:Fascia: No Tendon: No Muscle: No Joint: No Bone: No Small (1-33%)] [N/A:N/A] Epithelialization: [3:Debridement - Selective/Open Wound N/A] [N/A:N/A] Debridement: Pre-procedure Verification/Time Out 13:24 [4:N/A] [N/A:N/A] Taken: [3:Callus] [4:N/A] [N/A:N/A] Tissue Debrided: [3:Skin/Epidermis] [4:N/A] [N/A:N/A] Level: [3:8.4] [4:N/A] [N/A:N/A] Debridement A (sq cm): [3:rea Blade, Forceps] [4:N/A] [N/A:N/A] Instrument: [3:None] [4:N/A] [N/A:N/A] Bleeding: [3:0] [4:N/A] [N/A:N/A] Procedural Pain: [3:0] [4:N/A] [N/A:N/A] Post Procedural Pain: [3:Procedure was tolerated well] [4:N/A] [N/A:N/A] Debridement Treatment Response: [3:2.8x3x1] [4:N/A] [N/A:N/A] Post Debridement Measurements L x W x [name] [3:6.597] [4:N/A] [N/A:N/A] Post Debridement Volume: (cm) [3:Debridement] [4:Compression Therapy] [N/A:N/A] Procedures Performed: [3:T Contact Cast otal] Treatment Notes Electronic Signature(s) Signed: 04/06/2020 3:00:47 PM By: [name] MD Signed: 04/07/2020 5:50:13 PM By: [name] RN, BSN Entered By: [name] on 04/06/2020 13:56:22 -------------------------------------------------------------------------------- Multi-Disciplinary Care Plan Details Patient Name: Date of Service: [name], Max  [name] 04/06/2020 12:45 PM Medical Record Number: [id number] Patient Account Number: [id number] Date of Birth/Sex: Treating RN: [birth year] (35 y.o. [name] Primary Care Teancum Brule: [name] Other Clinician: Referring Aceson Labell: Treating Janmichael Giraud/Extender: [name] in Treatment: 23 Active Inactive Nutrition Nursing Diagnoses: Imbalanced nutrition Potential for alteratiion in Nutrition/Potential for imbalanced nutrition Goals: Patient/caregiver agrees to and verbalizes understanding of need to use nutritional supplements and/or vitamins as prescribed Date Initiated: 10/24/2019 Date Inactivated: 04/06/2020 Target Resolution Date: 04/03/2020 Goal Status: Met Patient/caregiver will maintain therapeutic glucose control Date Initiated: 10/24/2019 Target Resolution Date: 05/01/2020 Goal Status: Active Interventions: Assess HgA1c results as ordered upon admission and as needed Assess patient nutrition upon admission and as needed per policy Provide education on elevated blood sugars and impact on wound healing Provide education on nutrition Treatment Activities: Education provided on Nutrition : 11/01/2019 Notes: Wound/Skin Impairment Nursing Diagnoses: Impaired tissue integrity Knowledge deficit related to ulceration/compromised skin integrity Goals: Patient/caregiver will verbalize understanding of skin care regimen Date Initiated: 10/24/2019 Target Resolution Date: 05/01/2020 Goal Status: Active Ulcer/skin breakdown will have a volume reduction of 30% by week 4 Date Initiated: 10/24/2019 Date Inactivated: 01/16/2020 Target Resolution Date: 01/10/2020 Unmet Reason: no change in Goal Status: Unmet measurements. Interventions: Assess patient/caregiver ability to obtain necessary supplies Assess patient/caregiver ability to perform ulcer/skin care regimen upon admission and as needed Assess ulceration(s) every  visit Provide education on ulcer and skin  care Notes: Electronic Signature(s) Signed: 04/07/2020 5:50:13 PM By: [name] RN, BSN Entered By: [name] on 04/06/2020 12:40:46 -------------------------------------------------------------------------------- Pain Assessment Details Patient Name: Date of Service: [name], [name] 04/06/2020 12:45 PM Medical Record Number: [id number] Patient Account Number: [id number] Date of Birth/Sex: Treating RN: 10-12-1985 (35 y.o. [name] Primary Care Yaacov Koziol: [name] Other Clinician: Referring Bren Steers: Treating Aniylah Avans/Extender: [name] in Treatment: 23 Active Problems Location of Pain Severity and Description of Pain Patient Has Paino No Site Locations Rate the pain. Current Pain Level: 0 Pain Management and Medication Current Pain Management: Electronic Signature(s) Signed: 04/07/2020 5:38:12 PM By: Rhae Hammock RN Entered By: Rhae Hammock on 04/06/2020 12:35:20 -------------------------------------------------------------------------------- Patient/Caregiver Education Details Patient Name: Date of Service: [name] 1/3/2022andnbsp12:45 PM Medical Record Number: [id number] Patient Account Number: [id number] Date of Birth/Gender: Treating RN: [birth year] (35 y.o. [name] Primary Care Physician: [name] Other Clinician: Referring Physician: Treating Physician/Extender: [name] in Treatment: 23 Education Assessment Education Provided To: Patient Education Topics Provided Wound/Skin Impairment: Methods: Explain/Verbal Responses: State content correctly [employer]) Signed: 04/07/2020 5:50:13 PM By: [name] RN, BSN Entered By: [name] on 04/06/2020 12:40:59 -------------------------------------------------------------------------------- Wound Assessment Details Patient Name: Date of Service: [name], [name] 04/06/2020 12:45  PM Medical Record Number: [id number] Patient Account Number: [id number] Date of Birth/Sex: Treating RN: [birth year] (35 y.o. [name], [name] Primary Care Barbaraann Avans: [name] Other Clinician: Referring Elric Tirado: Treating Shaunte Weissinger/Extender: [name] in Treatment: 23 Wound Status Wound Number: 3 Primary Etiology: Diabetic Wound/Ulcer of the Lower Extremity Wound Location: Left, Lateral Foot Wound Status: Open Wounding Event: Trauma Comorbid History: Type II Diabetes Date Acquired: 10/02/2019 Weeks Of Treatment: 23 Clustered Wound: No Wound Measurements Length: (cm) 2.8 Width: (cm) 3 Depth: (cm) 1 Area: (cm) 6.597 Volume: (cm) 6.597 % Reduction in Area: -300.1% % Reduction in Volume: -3898.2% Tunneling: No Undermining: Yes Starting Position (o'clock): 7 Ending Position (o'clock): 11 Maximum Distance: (cm) 1.5 Wound Description Classification: Grade 2 Wound Margin: Distinct, outline attached Exudate Amount: Medium Exudate Type: Serosanguineous Exudate Color: red, brown Foul Odor After Cleansing: No Slough/Fibrino Yes Wound Bed Granulation Amount: Large (67-100%) Exposed Structure Granulation Quality: Red, Pink Fascia Exposed: No Necrotic Amount: Small (1-33%) Fat Layer (Subcutaneous Tissue) Exposed: Yes Necrotic Quality: Adherent Slough Tendon Exposed: No Muscle Exposed: No Joint Exposed: No Bone Exposed: No Treatment Notes Wound #3 (Foot) Wound Laterality: Left, Lateral Cleanser Peri-Wound Care Sween Lotion (Moisturizing lotion) Discharge Instruction: Apply moisturizing lotion as directed Topical Primary Dressing Hydrofera Blue Classic Foam, 2x2 in Discharge Instruction: Apply to wound bed as instructed Secondary Dressing Woven Gauze Sponge, Non-Sterile 4x4 in Discharge Instruction: Apply over primary dressing as directed. Zetuvit Plus 4x8 in Discharge Instruction: Apply over primary dressing as directed. T Contact  Cast otal Secured With Compression Wrap Compression Stockings Add-Ons Electronic Signature(s) Signed: 04/07/2020 5:38:12 PM By: Rhae Hammock RN Entered By: Rhae Hammock on 04/06/2020 13:06:43 -------------------------------------------------------------------------------- Wound Assessment Details Patient Name: Date of Service: [name] 04/06/2020 12:45 PM Medical Record Number: [id number] Patient Account Number: [id number] Date of Birth/Sex: Treating RN: 1986-04-02 (35 y.o. [name] Primary Care Rochelle Larue: [name] Other Clinician: Referring Laquentin Loudermilk: Treating Joshuajames Moehring/Extender: [name] in Treatment: 23 Wound Status Wound Number: 4 Primary Etiology: Diabetic Wound/Ulcer of the Lower Extremity Wound Location: Right, Lateral Foot Wound Status: Open Wounding Event:  Gradually Appeared Comorbid History: Type II Diabetes Date Acquired: 02/06/2020 Weeks Of Treatment: 7 Clustered Wound: No Wound Measurements Length: (cm) 1.2 Width: (cm) 1 Depth: (cm) 0.1 Area: (cm) 0.942 Volume: (cm) 0.094 % Reduction in Area: -2.5% % Reduction in Volume: -2.2% Epithelialization: Small (1-33%) Tunneling: No Undermining: No Wound Description Classification: Grade 2 Wound Margin: Distinct, outline attached Exudate Amount: Medium Exudate Type: Serosanguineous Exudate Color: red, brown Foul Odor After Cleansing: No Slough/Fibrino No Wound Bed Granulation Amount: Large (67-100%) Exposed Structure Granulation Quality: Red, Pink Fascia Exposed: No Necrotic Amount: None Present (0%) Fat Layer (Subcutaneous Tissue) Exposed: Yes Tendon Exposed: No Muscle Exposed: No Joint Exposed: No Bone Exposed: No Treatment Notes Wound #4 (Foot) Wound Laterality: Right, Lateral Cleanser Peri-Wound Care Sween Lotion (Moisturizing lotion) Discharge Instruction: Apply moisturizing lotion as directed Topical Primary Dressing Hydrofera  Blue Classic Foam, 2x2 in Discharge Instruction: Apply to wound bed as instructed Secondary Dressing Woven Gauze Sponge, Non-Sterile 4x4 in Discharge Instruction: Apply over primary dressing as directed. ABD Pad, 5x9 Discharge Instruction: Apply over primary dressing as directed. Felt 2.5 yds x 5.5 in Discharge Instruction: Apply over primary dressing as callous pad Secured With Compression Wrap FourPress (4 layer compression wrap) Discharge Instruction: apply unna boot first layer at upper portion of lower leg. Apply four layer compression as directed. Compression Stockings Add-Ons Electronic Signature(s) Signed: 04/07/2020 5:38:12 PM By: Rhae Hammock RN Entered By: Rhae Hammock on 04/06/2020 13:07:21 -------------------------------------------------------------------------------- Vitals Details Patient Name: Date of Service: [name], [name] 04/06/2020 12:45 PM Medical Record Number: [id number] Patient Account Number: [id number] Date of Birth/Sex: Treating RN: 08/13/1985 (35 y.o. [name], [name] Primary Care Clide Remmers: [name] Other Clinician: Referring Tamar Miano: Treating Tniya Bowditch/Extender: [name] in Treatment: 23 Vital Signs Time Taken: 12:34 Temperature (F): 97.6 Height (in): 77 Pulse (bpm): 90 Weight (lbs): 280 Respiratory Rate (breaths/min): 17 Body Mass Index (BMI): 33.2 Blood Pressure (mmHg): 155/96 Capillary Blood Glucose (mg/dl): 130 Reference Range: 80 - 120 mg / dl Electronic Signature(s) Signed: 04/07/2020 5:38:12 PM By: Rhae Hammock RN Entered By: Rhae Hammock on 04/06/2020 12:35:12

## 2020-04-07 NOTE — Progress Notes (Signed)
[name], [name] ([id number]) Visit Report for 04/06/2020 Debridement Details Patient Name: Date of Service: [name] 04/06/2020 12:45 PM Medical Record Number: [id number] Patient Account Number: [id number] Date of Birth/Sex: Treating RN: 10/02/1985 (35 y.o. [name] Primary Care Provider: [name] Other Clinician: Referring Provider: Treating Provider/Extender: [name] in Treatment: 23 Debridement Performed for Assessment: Wound #3 Left,Lateral Foot Performed By: Physician [name], MD Debridement Type: Debridement Severity of Tissue Pre Debridement: Fat layer exposed Level of Consciousness (Pre-procedure): Awake and Alert Pre-procedure Verification/Time Out Yes - 13:24 Taken: Start Time: 13:24 T Area Debrided (L x W): otal 2.8 (cm) x 3 (cm) = 8.4 (cm) Tissue and other material debrided: Non-Viable, Callus, Skin: Epidermis Level: Skin/Epidermis Debridement Description: Selective/Open Wound Instrument: Blade, Forceps Bleeding: None End Time: 13:25 Procedural Pain: 0 Post Procedural Pain: 0 Response to Treatment: Procedure was tolerated well Level of Consciousness (Post- Awake and Alert procedure): Post Debridement Measurements of Total Wound Length: (cm) 2.8 Width: (cm) 3 Depth: (cm) 1 Volume: (cm) 6.597 Character of Wound/Ulcer Post Debridement: Improved Severity of Tissue Post Debridement: Fat layer exposed Post Procedure Diagnosis Same as Pre-procedure Electronic Signature(s) Signed: 04/06/2020 3:00:47 PM By: [name] MD Signed: 04/07/2020 5:50:13 PM By: [name] RN, BSN Entered By: [name] on 04/06/2020 13:56:39 -------------------------------------------------------------------------------- HPI Details Patient Name: Date of Service: [name], [name] 04/06/2020 12:45 PM Medical Record Number: [id number] Patient Account Number: [id number] Date of Birth/Sex: Treating RN: May 23, 1985 (35  y.o. 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 He comes in for review of a wound over the plantar fifth metatarsal head extending into the lateral part of the foot. He was followed for this previously by his podiatrist Dr. Cornelius Moras. As the patient tells his story he went to see podiatry first for a swelling he developed on the lateral part of his fifth metatarsal head in May. He states this was "open" by podiatry and the area closed. He was followed up in June and it was again opened callus removed and it closed promptly. There were plans being made for surgery on the fifth metatarsal head in June however his blood sugar was apparently too high for anesthesia. Apparently the area was debrided and opened again in June and it is never closed since. Looking over the records from podiatry I am really not able to follow this. It was clear when he was first seen it was before 5/14 at that point he already had a wound. By 5/17 the ulcer was resolved. I do not see anything about a procedure. On 5/28 noted to have pre-ulcerative moderate keratosis. X-ray noted 1/5 contracted toe and tailor's bunion and metatarsal deformity. On a visit date on 09/28/2018 the dorsal part of the left foot it healed and resolved. There was concern about swelling in his lower extremity he was sent to the ER.. As far as I can tell he was seen in the ER on 7/12 with an ulcer on his left foot. A DVT rule out of the left leg was negative. I do not think I have complete records from podiatry but I am not able to verify the procedures this patient states he had. He states after the last procedure the wound has never closed although  I am not able to follow this  in the records I have from podiatry. He has not had a recent x-ray The patient has been using Neosporin on the wound. He is wearing a Darco shoe. He is still very active up on his foot working and exercising. Past medical history; type 2 diabetes ketosis-prone, leg swelling with a negative DVT study in July. Non-smoker ABI in our clinic was 0.85 on the left 10/16; substantial wound on the plantar left fifth met head extending laterally almost to the dorsal fifth MTP. We have been using silver alginate we gave him a Darco forefoot off loader. An x-ray did not show evidence of osteomyelitis did note soft tissue emphysema which I think was due to gas tracking through an open wound. There is no doubt in my mind he requires an MRI 10/23; MRI not booked until 3 November at the earliest this is largely due to his glucose sensor in the right arm. We have been using silver alginate. There has been an improvement 10/29; I am still not exactly sure when his MRI is booked for. He says it is the third but it is the 10th in epic. This definitely needs to be done. He is running a low-grade fever today but no other symptoms. No real improvement in the 1 02/26/2019 patient presents today for a follow-up visit here in our clinic he is last been seen in the clinic on October 29. Subsequently we were working on getting MRI to evaluate and see what exactly was going on and where we would need to go from the standpoint of whether or not he had osteomyelitis and again what treatments were going be required. Subsequently the patient ended up being admitted to the hospital on 02/07/2019 and was discharged on 02/14/2019. This is a somewhat interesting admission with a discharge diagnosis of pneumonia due to COVID-19 although he was positive for COVID-19 when tested at the urgent care but negative x2 when he was actually in the hospital. With that being said he did have acute respiratory failure with hypoxia and it was noted  he also have a left foot ulceration with osteomyelitis. With that being said he did require oxygen for his pneumonia and I level 4 L. He was placed on antivirals and steroids for the COVID-19. He was also transferred to the [facility] at one point. Nonetheless he did subsequently discharged home and since being home has done much better in that regard. The CT angiogram did not show any pulmonary embolism. With regard to the osteomyelitis the patient was placed on vancomycin and Zosyn while in the hospital but has been changed to Augmentin at discharge. It was also recommended that he follow- up with wound care and podiatry. Podiatry however wanted him to see [name] according to the patient prior to them doing anything further. His hemoglobin A1c was 9.9 as noted in the hospital. Have an MRI of the left foot performed while in the hospital on 02/04/2019. 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 There is an overlying plantar open wound noted an abscess tracking back along the lateral aspect of the fifth metatarsal shaft. There is otherwise diffuse cellulitis and mild fasciitis without findings of polymyositis. The patient did have recently pneumonia secondary to COVID-19 I looked in the chart through epic and it does appear that the patient may need to have an additional x-ray just to ensure everything  is cleared and that he has no airspace disease prior to putting him into the [name]. 03/05/2019; patient was readmitted to the clinic last week. He was hospitalized twice for a viral upper respiratory tract infection from 11/1 through 11/4 and then 11/5 through 11/12 ultimately this turned out to be Covid pneumonitis. Although he was discharged on oxygen he is not using it. He says he feels fine. He has no exercise limitation no cough no sputum. His O2 sat in our clinic today was 100% on room air. He did  manage to have his MRI which showed septic arthritis at the fifth MTP joint and osteomyelitis involving the fifth metatarsal head and proximal phalanx. He received Vanco and Zosyn in the hospital and then was discharged on 2 weeks of Augmentin. I do not see any relevant cultures. He was supposed to follow-up with infectious disease but I do not see that he has an appointment. 12/8; patient saw Dr. Novella Olive of infectious disease last week. He felt that he had had adequate antibiotic therapy. He did not go to follow-up with Dr. Amalia Hailey of podiatry and I have again talked to him about the pros and cons of this. He does not want to consider a ray amputation of this time. He is aware of the risks of recurrence, migration etc. He started HBO today and tolerated this well. He can complete the Augmentin that I gave him last week. I have looked over the lab work that Dr. Chana Bode ordered his C-reactive protein was 3.3 and his sedimentation rate was 17. The C-reactive protein is never really been measurably that high in this patient 12/15; not much change in the wound today however he has undermining along the lateral part of the foot again more extensively than last week. He has some rims of epithelialization. We have been using silver alginate. He is undergoing hyperbarics but did not dive today 12/18; in for his obligatory first total contact cast change. Unfortunately there was pus coming from the undermining area around his fifth metatarsal head. This was cultured but will preclude reapplication of a cast. He is seen in conjunction with HBO 12/24; patient had staph lugdunensis in the wound in the undermining area laterally last time. We put him on doxycycline which should have covered this. The wound looks better today. I am going to give him another week of doxycycline before reattempting the total contact cast 12/31; the patient is completing antibiotics. Hemorrhagic debris in the distal part of the wound with  some undermining distally. He also had hyper granulation. Extensive debridement with a #5 curette. The infected area that was on the lateral part of the fifth met head is closed over. I do not think he needs any more antibiotics. Patient was seen prior to HBO. Preparations for a total contact cast were made in the cast will be placed post hyperbarics 04/11/19; once again the patient arrives today without complaint. He had been in a cast all week noted that he had heavy drainage this week. This resulted in large raised areas of macerated tissue around the wound 1/14; wound bed looks better slightly smaller. Hydrofera Blue has been changing himself. He had a heavy drainage last week which caused a lot of maceration around the wound so I took him out of a total contact cast he says the drainage is actually better this week He is seen today in conjunction with HBO 1/21; returns to clinic. He was up in [place] for a day or 2 attending a  funeral. He comes back in with the wound larger and with a large area of exposed bone. He had osteomyelitis and septic arthritis of the fifth left metatarsal head while he was in hospital. He received IV antibiotics in the hospital for a prolonged period of time then 3 weeks of Augmentin. Subsequently I gave him 2 weeks of doxycycline for more superficial wound infection. When I saw this last week the wound was smaller the surface of the wound looks satisfactory. 1/28; patient missed hyperbarics today. Bone biopsy I did last time showed Enterococcus faecalis and Staphylococcus lugdunensis . He has a wide area of exposed bone. We are going to use silver alginate as of today. I had another ethical discussion with the patient. 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 In this situation I think the likelihood of healing this is low. Therefore I have recommended a ray amputation and with the patient's agreement I have referred him to Dr.  Doran Durand. The other issue is that his compliance with hyperbarics has been minimal because of his work schedule and given his underlying decision I am going to stop this today READMISSION 10/24/2019 MRI 09/29/2019 left foot IMPRESSION: 1. Apparent skin ulceration inferior and lateral to the 5th metatarsal base with underlying heterogeneous T2 signal and enhancement in the subcutaneous fat. Small peripherally enhancing fluid collections along the plantar and lateral aspects of the 5th metatarsal base suspicious for abscesses. 2. Interval amputation through the mid 5th metatarsal with nonspecific low-level marrow edema and enhancement. Given the proximity to the adjacent soft tissue inflammatory changes, osteomyelitis cannot be excluded. 3. The additional bones appear unremarkable. MRI 09/29/2019 right foot IMPRESSION: 1. Soft tissue ulceration lateral to the 5th MTP joint. There is low-level T2 hyperintensity within the 4th and 5th metatarsal heads and adjacent proximal phalanges without abnormal T1 signal or cortical destruction. These findings are nonspecific and could be seen with early marrow edema, hyperemia or early osteomyelitis. No evidence of septic joint. 2. Mild tenosynovitis and synovial enhancement associated with the extensor digitorum tendons at the level of the midfoot. 3. Diffuse low-level muscular T2 hyperintensity and enhancement, most consistent with diabetic myopathy. LEFT FOOT BONE Methicillin resistant staphylococcus aureus Staphylococcus lugdunensis MIC MIC CIPROFLOXACIN >=8 RESISTANT Resistant <=0.5 SENSI... Sensitive CLINDAMYCIN <=0.25 SENS... Sensitive >=8 RESISTANT Resistant ERYTHROMYCIN >=8 RESISTANT Resistant >=8 RESISTANT Resistant GENTAMICIN <=0.5 SENSI... Sensitive <=0.5 SENSI... Sensitive Inducible Clindamycin NEGATIVE Sensitive NEGATIVE Sensitive OXACILLIN >=4 RESISTANT Resistant 2 SENSITIVE Sensitive RIFAMPIN <=0.5 SENSI... Sensitive <=0.5 SENSI...  Sensitive TETRACYCLINE <=1 SENSITIVE Sensitive <=1 SENSITIVE Sensitive TRIMETH/SULFA <=10 SENSIT Sensitive <=10 SENSIT Sensitive ... Marland Kitchen.. VANCOMYCIN 1 SENSITIVE Sensitive <=0.5 SENSI... Sensitive Right foot bone . Component 3 wk ago Specimen Description BONE Special Requests RIGHT 4 METATARSAL SAMPLE B Gram Stain NO WBC SEEN NO ORGANISMS SEEN Culture RARE METHICILLIN RESISTANT STAPHYLOCOCCUS AUREUS NO ANAEROBES ISOLATED Performed at [facility], [place] [address] [place], [place] [address] Report Status 10/08/2019 FINAL Organism ID, Bacteria METHICILLIN RESISTANT STAPHYLOCOCCUS AUREUS Resulting Agency [facility] Susceptibility Methicillin resistant staphylococcus aureus MIC CIPROFLOXACIN >=8 RESISTANT Resistant CLINDAMYCIN <=0.25 SENS... Sensitive ERYTHROMYCIN >=8 RESISTANT Resistant GENTAMICIN <=0.5 SENSI... Sensitive Inducible Clindamycin NEGATIVE Sensitive OXACILLIN >=4 RESISTANT Resistant RIFAMPIN <=0.5 SENSI... Sensitive TETRACYCLINE <=1 SENSITIVE Sensitive TRIMETH/SULFA <=10 SENSIT Sensitive ... VANCOMYCIN 1 SENSITIVE Sensitive This is a patient we had in clinic earlier this year with a wound over his left fifth metatarsal head. 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 Doran Durand on 05/16/2019. He seems to have developed problems with wounds on his bilateral feet in June 2021 although he may have had problems earlier than this. He was in an urgent care with a right foot ulcer on 09/26/2019 and given a course of doxycycline. This was apparently after having trouble getting into see orthopedics. He was seen by podiatry on 09/28/2019 noted to have bilateral lower extremity ulcers including the left  lateral fifth metatarsal base and the right subfifth met head. It was noted that had purulent drainage at that time. He required hospitalization from 6/20 through 7/2. This was because of worsening right foot wounds. He underwent bilateral operative incision and drainage and bone biopsies bilaterally. Culture results are listed above. He has been referred back to clinic by Dr. Jacqualyn Posey of podiatry. He is also followed by Dr. Megan Salon who saw him yesterday. He was discharged from hospital on Zyvox Flagyl and Levaquin and yesterday changed to doxycycline Flagyl and Levaquin. His inflammatory markers on 6/26 showed a sedimentation rate of 129 and a C-reactive protein of 5. This is improved to 14 and 1.3 respectively. This would indicate improvement. ABIs in our clinic today were 1.23 on the right and 1.20 on the left 11/01/2019 on evaluation today patient appears to be doing fairly well in regard to the wounds on his feet at this point. Fortunately there is no signs of active infection at this time. No fevers, chills, nausea, vomiting, or diarrhea. He currently is seeing infectious disease and still under their care at this point. Subsequently he also has both wounds which she has not been using collagen on as he did not receive that in his packaging he did not call us and let us know that. Apparently that just was missed on the order. Nonetheless we will get that straightened out today. 8/9-Patient returns for bilateral foot wounds, using Prisma with hydrogel moistened dressings, and the wounds appear stable. Patient using surgical shoes, avoiding much pressure or weightbearing as much as possible 8/16; patient has bilateral foot wounds. 1 on the right lateral foot proximally the other is on the left mid lateral foot. Both required debridement of callus and thick skin around the wounds. We have been using silver collagen 8/27; patient has bilateral lateral foot wounds. The area on the left substantially  surrounded by callus and dry skin. This was removed from the wound edge. The underlying wound is small. The area on the right measured somewhat smaller today. 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 Unfortunately I did not update his antibiotics during today's visit. 9/10 I reviewed Dr. Hale Bogus last notes he felt he had completed antibiotics his inflammatory markers were reasonably well controlled. He has a small wound on the lateral left foot and a tiny area on the right which is just above closed. He is using Hydrofera Blue with border foam he has bilateral surgical shoes 9/24; 2 week f/u. doing well. right foot is closed. left foot still undermined. 10/14; right foot remains closed at the fifth met head. The area over the base of the left fifth metatarsal has a small open area but considerable undermining towards the plantar foot. Thick callus skin around this suggests an adequate pressure relief. We have  talked about this. He says he is going to go back into his cam boot. I suggested a total contact cast he did not seem enamored with this suggestion 10/26; left foot base of the fifth metatarsal. Same condition as last time. He has skin over the area with an open wound however the skin is not adherent. He went to see Dr. Earleen Newport who did an x-ray and culture of his foot I have not reviewed the x-ray but the patient was not told anything. He is on doxycycline 11/11; since the patient was last here he was in the emergency room on 10/30 he was concerned about swelling in the left foot. They did not do any cultures or x-rays. They changed his antibiotics to cephalexin. Previous culture showed group B strep. The cephalexin is appropriate as doxycycline has less than predictable coverage. Arrives in clinic today with swelling over this area under the wound. He also has a new wound on the right fifth metatarsal head 11/18; the patient  has a difficult wound on the lateral aspect of the left fifth metatarsal head. The wound was almost ballotable last week I opened it slightly expecting to see purulence however there was just bleeding. I cultured this this was negative. X-ray unchanged. We are trying to get an MRI but I am not sure were going to be able to get this through his insurance. He also has an area on the right lateral fifth metatarsal head this looks healthier 12/3; the patient finally got our MRI. Surprisingly this did not show osteomyelitis. I did show the soft tissue ulceration at the lateral plantar aspect of the fifth metatarsal base with a tiny residual 6 mm abscess overlying the superficial fascia I have tried to culture this area I have not been able to get this to grow anything. Nevertheless the protruding tissue looks aggravated. I suspect we should try to treat the underlying "abscess with broad-spectrum antibiotics. I am going to start him on Levaquin and Flagyl. He has much less edema in his legs and I am going to continue to wrap his legs and see him weekly 12/10. I started Levaquin and Flagyl on him last week. He just picked up the Flagyl apparently there was some delay. The worry is the wound on the left fifth metatarsal base which is substantial and worsening. His foot looks like he inverts at the ankle making this a weightbearing surface. Certainly no improvement in fact I think the measurements of this are somewhat worse. We have been using 12/17; he apparently just got the Levaquin yesterday this is 2 weeks after the fact. He has completed the Flagyl. The area over the left fifth metatarsal base still has protruding granulation tissue although it does not look quite as bad as it did some weeks ago. He has severe bilateral lymphedema although we have not been treating him for wounds on his legs this is definitely going to require compression. There was so much edema in the left I did not wish to put him in  a total contact cast today. I am going to increase his compression from 3-4 layer. The area on the right lateral fifth met head actually look quite good and superficial. 12/23; patient arrived with callus on the right fifth met head and the substantial hyper granulated callused wound on the base of his fifth metatarsal. He says he is completing his Levaquin in 2 days but I do not think that adds up with what I gave him but I  will have to double check this. We are using Hydrofera Blue on both areas. My plan is to put the left leg in a cast the week after New Year's 04/06/2020; patient's wounds about the same. Right lateral fifth metatarsal head and left lateral foot over the base of the fifth metatarsal. There is undermining on the left lateral foot which I removed before application of total contact cast continuing with Hydrofera Blue new. Patient tells me he was seen by endocrinology today lab work was done [Dr. Kerr]. Also wondering whether he was referred to cardiology. I went over some lab work from previously does not have chronic renal failure certainly not nephrotic range proteinuria he does have very poorly controlled diabetes but this is not his most updated lab work. Hemoglobin A1c has been over 11 Electronic Signature(s) Signed: 04/06/2020 3:00:47 PM By: [name] MD Entered By: [name] on 04/06/2020 13:58:18 -------------------------------------------------------------------------------- Physical Exam Details Patient Name: Date of Service: [name], [name] 04/06/2020 12:45 PM Medical Record Number: [id number] Patient Account Number: [id number] Date of Birth/Sex: Treating RN: 1985-06-18 (35 y.o. [name] Primary Care Provider: [name] Other Clinician: Referring Provider: Treating Provider/Extender: [name] in Treatment: 23 Constitutional Patient is hypertensive.. Pulse regular and within target range for patient.Marland Kitchen Respirations  regular, non-labored and within target range.. Temperature is normal and within the target range for the patient.Marland Kitchen Appears in no distress. Notes Wound exam; left lateral fifth metatarsal base. Tissue does not look unhealthy however overhanging skin and callus removed with #15 scalpel and pickups before application of the total contact cast which was done in the standard fashion On the right lateral fifth metatarsal head about the same although it does not look unhealthy and no debridement was felt to be necessary Electronic Signature(s) Signed: 04/06/2020 3:00:47 PM By: [name] MD Entered By: [name] on 04/06/2020 13:59:12 -------------------------------------------------------------------------------- Physician Orders Details Patient Name: Date of Service: [name], [name] 04/06/2020 12:45 PM Medical Record Number: [id number] Patient Account Number: [id number] Date of Birth/Sex: Treating RN: 08-Dec-1985 (35 y.o. [name] Primary Care Provider: [name] Other Clinician: Referring Provider: Treating Provider/Extender: [name] in Treatment: 23 Verbal / Phone Orders: No Diagnosis Coding ICD-10 Coding Code Description E11.621 Type 2 diabetes mellitus with foot ulcer L97.528 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ity L97.518 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ity Follow-up Appointments Return Appointment in 1 week. Bathing/ Shower/ Hygiene May shower with protection but do not get wound dressing(s) wet. - may use cast protectors Edema Control - Lymphedema / SCD / Other Bilateral Lower Extremities Elevate legs to the level of the heart or above for 30 minutes daily and/or when sitting, a frequency of: - throughout the day Avoid standing for long periods of time. Exercise regularly Off-Loading Total Contact Cast to Left Lower Extremity Open  toe surgical shoe to: - both feet, pad left shoe with felt to offload lateral foot Wound Treatment Wound #3 - Foot Wound Laterality: Left, Lateral Peri-Wound Care: Sween Lotion (Moisturizing lotion) 1 x Per Week Discharge Instructions: Apply moisturizing lotion as directed Prim Dressing: Hydrofera Blue Classic Foam, 2x2 in 1 x Per Week ary Discharge Instructions: Apply to wound bed as instructed Secondary Dressing: Woven Gauze Sponge, Non-Sterile 4x4 in 1 x Per Week Discharge Instructions: Apply over primary dressing as directed. Secondary Dressing: ABD Pad, 5x9 1 x Per Week Discharge  Instructions: Apply over primary dressing as directed. Secondary Dressing: T Contact Cast otal 1 x Per Week Wound #4 - Foot Wound Laterality: Right, Lateral Peri-Wound Care: Sween Lotion (Moisturizing lotion) 1 x Per Week Discharge Instructions: Apply moisturizing lotion as directed Prim Dressing: Hydrofera Blue Classic Foam, 2x2 in 1 x Per Week ary Discharge Instructions: Apply to wound bed as instructed Secondary Dressing: Woven Gauze Sponge, Non-Sterile 4x4 in 1 x Per Week Discharge Instructions: Apply over primary dressing as directed. Secondary Dressing: ABD Pad, 5x9 1 x Per Week Discharge Instructions: Apply over primary dressing as directed. Secondary Dressing: Felt 2.5 yds x 5.5 in 1 x Per Week Discharge Instructions: Apply over primary dressing as callous pad Compression Wrap: FourPress (4 layer compression wrap) 1 x Per Week Discharge Instructions: apply unna boot first layer at upper portion of lower leg. Apply four layer compression as directed. Electronic Signature(s) Signed: 04/06/2020 3:00:47 PM By: [name] MD Signed: 04/07/2020 5:50:13 PM By: [name] RN, BSN Entered By: [name] on 04/06/2020 13:27:07 -------------------------------------------------------------------------------- Problem List Details Patient Name: Date of Service: [name], [name] 04/06/2020 12:45  PM Medical Record Number: [id number] Patient Account Number: [id number] Date of Birth/Sex: Treating RN: [birth year] (35 y.o. 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, right ankle and foot 10/24/2019 10/24/2019 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, left ankle and foot 10/24/2019 10/24/2019 B95.62 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 10/24/2019 10/24/2019 classified elsewhere Resolved Problems Electronic Signature(s) Signed: 04/06/2020 3:00:47 PM By: [name] MD Entered By: [name] on 04/06/2020 13:56:13 -------------------------------------------------------------------------------- Progress Note Details Patient Name: Date of Service: [name], [name] 04/06/2020 12:45 PM Medical Record Number: [id number] Patient Account Number: [id number] Date of Birth/Sex: Treating RN: 03/24/1986 (35 y.o. 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 He comes in for review of a wound over the plantar fifth metatarsal head extending into the lateral part of the foot. He  was followed for this previously by his podiatrist Dr. Cornelius Moras. As the patient tells his story he went to see podiatry first for a swelling he developed on the lateral part of his fifth metatarsal head in May. He states this was "open" by podiatry and the area closed. He was followed up in June and it was again opened callus removed and it closed promptly. There were plans being made for surgery on the fifth metatarsal head in June however his blood sugar was apparently too high for anesthesia. Apparently the area was debrided and opened again in June and it is never closed since. Looking over the records from podiatry I am really not able to follow this. It was clear when he was first seen it was before 5/14 at that point he already had a wound. By 5/17 the ulcer was resolved.  I do not see anything about a procedure. On 5/28 noted to have pre-ulcerative moderate keratosis. X-ray noted 1/5 contracted toe and tailor's bunion and metatarsal deformity. On a visit date on 09/28/2018 the dorsal part of the left foot it healed and resolved. There was concern about swelling in his lower extremity he was sent to the ER.. As far as I can tell he was seen in the ER on 7/12 with an ulcer on his left foot. A DVT rule out of the left leg was negative. I do not think I have complete records from podiatry but I am not able to verify the procedures this patient states he had. He states after the last procedure the wound has never closed although I am not able to follow this in the records I have from podiatry. He has not had a recent x-ray The patient has been using Neosporin on the wound. He is wearing a Darco shoe. He is still very active up on his foot working and exercising. Past medical history; type 2 diabetes ketosis-prone, leg swelling with a negative DVT study in July. Non-smoker ABI in our clinic was 0.85 on the left 10/16; substantial wound on the plantar left fifth met head extending laterally almost to the  dorsal fifth MTP. We have been using silver alginate we gave him a Darco forefoot off loader. An x-ray did not show evidence of osteomyelitis did note soft tissue emphysema which I think was due to gas tracking through an open wound. There is no doubt in my mind he requires an MRI 10/23; MRI not booked until 3 November at the earliest this is largely due to his glucose sensor in the right arm. We have been using silver alginate. There has been an improvement 10/29; I am still not exactly sure when his MRI is booked for. He says it is the third but it is the 10th in epic. This definitely needs to be done. He is running a low-grade fever today but no other symptoms. No real improvement in the 1 02/26/2019 patient presents today for a follow-up visit here in our clinic he is last been seen in the clinic on October 29. Subsequently we were working on getting MRI to evaluate and see what exactly was going on and where we would need to go from the standpoint of whether or not he had osteomyelitis and again what treatments were going be required. Subsequently the patient ended up being admitted to the hospital on 02/07/2019 and was discharged on 02/14/2019. This is a somewhat interesting admission with a discharge diagnosis of pneumonia due to COVID-19 although he was positive for COVID-19 when tested at the urgent care but negative x2 when he was actually in the hospital. With that being said he did have acute respiratory failure with hypoxia and it was noted he also have a left foot ulceration with osteomyelitis. With that being said he did require oxygen for his pneumonia and I level 4 L. He was placed on antivirals and steroids for the COVID-19. He was also transferred to the [facility] at one point. Nonetheless he did subsequently discharged home and since being home has done much better in that regard. The CT angiogram did not show any pulmonary embolism. With regard to the osteomyelitis  the patient was placed on vancomycin and Zosyn while in the hospital but has been changed to Augmentin at discharge. It was also recommended that he follow- up with wound care and podiatry. Podiatry however wanted  him to see [name] according to the patient prior to them doing anything further. His hemoglobin A1c was 9.9 as noted in the hospital. Have an MRI of the left foot performed while in the hospital on 02/04/2019. 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 There is an overlying plantar open wound noted an abscess tracking back along the lateral aspect of the fifth metatarsal shaft. There is otherwise diffuse cellulitis and mild fasciitis without findings of polymyositis. The patient did have recently pneumonia secondary to COVID-19 I looked in the chart through epic and it does appear that the patient may need to have an additional x-ray just to ensure everything is cleared and that he has no airspace disease prior to putting him into the [name]. 03/05/2019; patient was readmitted to the clinic last week. He was hospitalized twice for a viral upper respiratory tract infection from 11/1 through 11/4 and then 11/5 through 11/12 ultimately this turned out to be Covid pneumonitis. Although he was discharged on oxygen he is not using it. He says he feels fine. He has no exercise limitation no cough no sputum. His O2 sat in our clinic today was 100% on room air. 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 He received Vanco and Zosyn in the hospital and then was discharged on 2 weeks of Augmentin. I do not see any relevant cultures. He was supposed to follow-up with infectious disease but I do not see that he has an appointment. 12/8; patient saw Dr. Novella Olive of infectious disease last week. He felt  that he had had adequate antibiotic therapy. He did not go to follow-up with Dr. Amalia Hailey of podiatry and I have again talked to him about the pros and cons of this. He does not want to consider a ray amputation of this time. He is aware of the risks of recurrence, migration etc. He started HBO today and tolerated this well. He can complete the Augmentin that I gave him last week. I have looked over the lab work that Dr. Chana Bode ordered his C-reactive protein was 3.3 and his sedimentation rate was 17. The C-reactive protein is never really been measurably that high in this patient 12/15; not much change in the wound today however he has undermining along the lateral part of the foot again more extensively than last week. He has some rims of epithelialization. We have been using silver alginate. He is undergoing hyperbarics but did not dive today 12/18; in for his obligatory first total contact cast change. Unfortunately there was pus coming from the undermining area around his fifth metatarsal head. This was cultured but will preclude reapplication of a cast. He is seen in conjunction with HBO 12/24; patient had staph lugdunensis in the wound in the undermining area laterally last time. We put him on doxycycline which should have covered this. The wound looks better today. I am going to give him another week of doxycycline before reattempting the total contact cast 12/31; the patient is completing antibiotics. Hemorrhagic debris in the distal part of the wound with some undermining distally. He also had hyper granulation. Extensive debridement with a #5 curette. The infected area that was on the lateral part of the fifth met head is closed over. I do not think he needs any more antibiotics. Patient was seen prior to HBO.  Preparations for a total contact cast were made in the cast will be placed post hyperbarics 04/11/19; once again the patient arrives today without complaint. He had been in a cast all week  noted that he had heavy drainage this week. This resulted in large raised areas of macerated tissue around the wound 1/14; wound bed looks better slightly smaller. Hydrofera Blue has been changing himself. He had a heavy drainage last week which caused a lot of maceration around the wound so I took him out of a total contact cast he says the drainage is actually better this week He is seen today in conjunction with HBO 1/21; returns to clinic. He was up in [place] for a day or 2 attending a funeral. He comes back in with the wound larger and with a large area of exposed bone. He had osteomyelitis and septic arthritis of the fifth left metatarsal head while he was in hospital. He received IV antibiotics in the hospital for a prolonged period of time then 3 weeks of Augmentin. Subsequently I gave him 2 weeks of doxycycline for more superficial wound infection. When I saw this last week the wound was smaller the surface of the wound looks satisfactory. 1/28; patient missed hyperbarics today. Bone biopsy I did last time showed Enterococcus faecalis and Staphylococcus lugdunensis . He has a wide area of exposed bone. We are going to use silver alginate as of today. I had another ethical discussion with the patient. 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 In this situation I think the likelihood of healing this is low. Therefore I have recommended a ray amputation and with the patient's agreement I have referred him to Dr. Doran Durand. The other issue is that his compliance with hyperbarics has been minimal because of his work schedule and given his underlying decision I am going to stop this today READMISSION 10/24/2019 MRI 09/29/2019 left foot IMPRESSION: 1. Apparent skin ulceration inferior and lateral to the 5th metatarsal base with underlying heterogeneous T2 signal and enhancement in the subcutaneous fat. Small peripherally enhancing fluid collections along the  plantar and lateral aspects of the 5th metatarsal base suspicious for abscesses. 2. Interval amputation through the mid 5th metatarsal with nonspecific low-level marrow edema and enhancement. Given the proximity to the adjacent soft tissue inflammatory changes, osteomyelitis cannot be excluded. 3. The additional bones appear unremarkable. MRI 09/29/2019 right foot IMPRESSION: 1. Soft tissue ulceration lateral to the 5th MTP joint. There is low-level T2 hyperintensity within the 4th and 5th metatarsal heads and adjacent proximal phalanges without abnormal T1 signal or cortical destruction. These findings are nonspecific and could be seen with early marrow edema, hyperemia or early osteomyelitis. No evidence of septic joint. 2. Mild tenosynovitis and synovial enhancement associated with the extensor digitorum tendons at the level of the midfoot. 3. Diffuse low-level muscular T2 hyperintensity and enhancement, most consistent with diabetic myopathy. LEFT FOOT BONE Methicillin resistant staphylococcus aureus Staphylococcus lugdunensis MIC MIC CIPROFLOXACIN >=8 RESISTANT Resistant <=0.5 SENSI... Sensitive CLINDAMYCIN <=0.25 SENS... Sensitive >=8 RESISTANT Resistant ERYTHROMYCIN >=8 RESISTANT Resistant >=8 RESISTANT Resistant GENTAMICIN <=0.5 SENSI... Sensitive <=0.5 SENSI... Sensitive Inducible Clindamycin NEGATIVE Sensitive NEGATIVE Sensitive OXACILLIN >=4 RESISTANT Resistant 2 SENSITIVE Sensitive RIFAMPIN <=0.5 SENSI... Sensitive <=0.5 SENSI... Sensitive TETRACYCLINE <=1 SENSITIVE Sensitive <=1 SENSITIVE Sensitive TRIMETH/SULFA <=10 SENSIT Sensitive <=10 SENSIT Sensitive ... Marland Kitchen.. VANCOMYCIN 1 SENSITIVE Sensitive <=0.5 SENSI... Sensitive Right foot bone . Component 3 wk ago Specimen Description BONE Special Requests RIGHT 4 METATARSAL SAMPLE B Gram Stain  NO WBC SEEN NO ORGANISMS SEEN Culture RARE METHICILLIN RESISTANT STAPHYLOCOCCUS AUREUS NO ANAEROBES ISOLATED Performed at [facility], [place] [address], [place], [place] [address] Report Status 10/08/2019 FINAL Organism ID, Bacteria METHICILLIN RESISTANT STAPHYLOCOCCUS AUREUS Resulting Agency [facility] Susceptibility Methicillin resistant staphylococcus aureus MIC CIPROFLOXACIN >=8 RESISTANT Resistant CLINDAMYCIN <=0.25 SENS... Sensitive ERYTHROMYCIN >=8 RESISTANT Resistant GENTAMICIN <=0.5 SENSI... Sensitive Inducible Clindamycin NEGATIVE Sensitive OXACILLIN >=4 RESISTANT Resistant RIFAMPIN <=0.5 SENSI... Sensitive TETRACYCLINE <=1 SENSITIVE Sensitive TRIMETH/SULFA <=10 SENSIT Sensitive ... VANCOMYCIN 1 SENSITIVE Sensitive This is a patient we had in clinic earlier this year with a wound over his left fifth metatarsal head. 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 Doran Durand on 05/16/2019. He seems to have developed problems with wounds on his bilateral feet in June 2021 although he may have had problems earlier than this. He was in an urgent care with a right foot ulcer on 09/26/2019 and given a course of doxycycline. This was apparently after having trouble getting into see orthopedics. He was seen by podiatry on 09/28/2019 noted to have bilateral lower extremity ulcers including the left lateral fifth metatarsal base and the right subfifth met head. It was noted that had purulent drainage at that time. He required hospitalization from 6/20 through 7/2. This was because of worsening right foot wounds. He underwent bilateral operative incision and drainage and bone biopsies bilaterally. Culture results are listed above. He has been referred back to clinic by Dr. Jacqualyn Posey of podiatry. He is also followed by Dr. Megan Salon who saw him  yesterday. He was discharged from hospital on Zyvox Flagyl and Levaquin and yesterday changed to doxycycline Flagyl and Levaquin. His inflammatory markers on 6/26 showed a sedimentation rate of 129 and a C-reactive protein of 5. This is improved to 14 and 1.3 respectively. This would indicate improvement. ABIs in our clinic today were 1.23 on the right and 1.20 on the left 11/01/2019 on evaluation today patient appears to be doing fairly well in regard to the wounds on his feet at this point. Fortunately there is no signs of active infection at this time. No fevers, chills, nausea, vomiting, or diarrhea. He currently is seeing infectious disease and still under their care at this point. Subsequently he also has both wounds which she has not been using collagen on as he did not receive that in his packaging he did not call us and let us know that. Apparently that just was missed on the order. Nonetheless we will get that straightened out today. 8/9-Patient returns for bilateral foot wounds, using Prisma with hydrogel moistened dressings, and the wounds appear stable. Patient using surgical shoes, avoiding much pressure or weightbearing as much as possible 8/16; patient has bilateral foot wounds. 1 on the right lateral foot proximally the other is on the left mid lateral foot. Both required debridement of callus and thick skin around the wounds. We have been using silver collagen 8/27; patient has bilateral lateral foot wounds. The area on the left substantially surrounded by callus and dry skin. This was removed from the wound edge. The underlying wound is small. The area on the right measured somewhat smaller today. We've been using silver collagen the patient was on antibiotics for underlying osteomyelitis in the  left foot. Unfortunately I did not update his antibiotics during today's visit. 9/10 I reviewed Dr. Hale Bogus last notes he felt he had completed antibiotics his inflammatory markers were  reasonably well controlled. He has a small wound on the lateral left foot and a tiny area on the right which is just above closed. He is using Hydrofera Blue with border foam he has bilateral surgical shoes 9/24; 2 week f/u. doing well. right foot is closed. left foot still undermined. 10/14; right foot remains closed at the fifth met head. The area over the base of the left fifth metatarsal has a small open area but considerable undermining towards the plantar foot. Thick callus skin around this suggests an adequate pressure relief. We have talked about this. He says he is going to go back into his cam boot. I suggested a total contact cast he did not seem enamored with this suggestion 10/26; left foot base of the fifth metatarsal. Same condition as last time. He has skin over the area with an open wound however the skin is not adherent. He went to see Dr. Earleen Newport who did an x-ray and culture of his foot I have not reviewed the x-ray but the patient was not told anything. He is on doxycycline 11/11; since the patient was last here he was in the emergency room on 10/30 he was concerned about swelling in the left foot. They did not do any cultures or x-rays. They changed his antibiotics to cephalexin. Previous culture showed group B strep. The cephalexin is appropriate as doxycycline has less than predictable coverage. Arrives in clinic today with swelling over this area under the wound. He also has a new wound on the right fifth metatarsal head 11/18; the patient has a difficult wound on the lateral aspect of the left fifth metatarsal head. The wound was almost ballotable last week I opened it slightly expecting to see purulence however there was just bleeding. I cultured this this was negative. X-ray unchanged. We are trying to get an MRI but I am not sure were going to be able to get this through his insurance. He also has an area on the right lateral fifth metatarsal head this looks healthier 12/3;  the patient finally got our MRI. Surprisingly this did not show osteomyelitis. I did show the soft tissue ulceration at the lateral plantar aspect of the fifth metatarsal base with a tiny residual 6 mm abscess overlying the superficial fascia I have tried to culture this area I have not been able to get this to grow anything. Nevertheless the protruding tissue looks aggravated. I suspect we should try to treat the underlying "abscess with broad-spectrum antibiotics. I am going to start him on Levaquin and Flagyl. He has much less edema in his legs and I am going to continue to wrap his legs and see him weekly 12/10. I started Levaquin and Flagyl on him last week. He just picked up the Flagyl apparently there was some delay. The worry is the wound on the left fifth metatarsal base which is substantial and worsening. His foot looks like he inverts at the ankle making this a weightbearing surface. Certainly no improvement in fact I think the measurements of this are somewhat worse. We have been using 12/17; he apparently just got the Levaquin yesterday this is 2 weeks after the fact. He has completed the Flagyl. The area over the left fifth metatarsal base still has protruding granulation tissue although it does not look quite as bad as  it did some weeks ago. He has severe bilateral lymphedema although we have not been treating him for wounds on his legs this is definitely going to require compression. There was so much edema in the left I did not wish to put him in a total contact cast today. I am going to increase his compression from 3-4 layer. The area on the right lateral fifth met head actually look quite good and superficial. 12/23; patient arrived with callus on the right fifth met head and the substantial hyper granulated callused wound on the base of his fifth metatarsal. He says he is completing his Levaquin in 2 days but I do not think that adds up with what I gave him but I will have to  double check this. We are using Hydrofera Blue on both areas. My plan is to put the left leg in a cast the week after New Year's 04/06/2020; patient's wounds about the same. Right lateral fifth metatarsal head and left lateral foot over the base of the fifth metatarsal. There is undermining on the left lateral foot which I removed before application of total contact cast continuing with Hydrofera Blue new. Patient tells me he was seen by endocrinology today lab work was done [Dr. Kerr]. Also wondering whether he was referred to cardiology. I went over some lab work from previously does not have chronic renal failure certainly not nephrotic range proteinuria he does have very poorly controlled diabetes but this is not his most updated lab work. Hemoglobin A1c has been over 11 Objective Constitutional Patient is hypertensive.. Pulse regular and within target range for patient.Marland Kitchen Respirations regular, non-labored and within target range.. Temperature is normal and within the target range for the patient.Marland Kitchen Appears in no distress. Vitals Time Taken: 12:34 PM, Height: 77 in, Weight: 280 lbs, BMI: 33.2, Temperature: 97.6 F, Pulse: 90 bpm, Respiratory Rate: 17 breaths/min, Blood Pressure: 155/96 mmHg, Capillary Blood Glucose: 130 mg/dl. General Notes: Wound exam; left lateral fifth metatarsal base. Tissue does not look unhealthy however overhanging skin and callus removed with #15 scalpel and pickups before application of the total contact cast which was done in the standard fashion ooOn the right lateral fifth metatarsal head about the same although it does not look unhealthy and no debridement was felt to be necessary Integumentary (Hair, Skin) Wound #3 status is Open. Original cause of wound was Trauma. The wound is located on the Left,Lateral Foot. The wound measures 2.8cm length x 3cm width x 1cm depth; 6.597cm^2 area and 6.597cm^3 volume. There is Fat Layer (Subcutaneous Tissue) exposed. There is no  tunneling noted, however, there is undermining starting at 7:00 and ending at 11:00 with a maximum distance of 1.5cm. There is a medium amount of serosanguineous drainage noted. The wound margin is distinct with the outline attached to the wound base. There is large (67-100%) red, pink granulation within the wound bed. There is a small (1-33%) amount of necrotic tissue within the wound bed including Adherent Slough. Wound #4 status is Open. Original cause of wound was Gradually Appeared. The wound is located on the Right,Lateral Foot. The wound measures 1.2cm length x 1cm width x 0.1cm depth; 0.942cm^2 area and 0.094cm^3 volume. There is Fat Layer (Subcutaneous Tissue) exposed. There is no tunneling or undermining noted. There is a medium amount of serosanguineous drainage noted. The wound margin is distinct with the outline attached to the wound base. There is large (67-100%) red, pink granulation within the wound bed. There is no necrotic tissue within  the wound bed. 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 There was a Selective/Open Wound Skin/Epidermis Debridement with a total area of 8.4 sq cm performed by [name], MD. With the following instrument(s): Blade, and Forceps to remove Non-Viable tissue/material. Material removed includes Callus and Skin: Epidermis and. No specimens were taken. A time out was conducted at 13:24, prior to the start of the procedure. There was no bleeding. The procedure was tolerated well with a pain level of 0 throughout and a pain level of 0 following the  procedure. Post Debridement Measurements: 2.8cm length x 3cm width x 1cm depth; 6.597cm^3 volume. Character of Wound/Ulcer Post Debridement is improved. Severity of Tissue Post Debridement is: Fat layer exposed. Post procedure Diagnosis Wound #3: Same as Pre-Procedure Pre-procedure diagnosis of Wound #3 is a Diabetic Wound/Ulcer of the Lower Extremity located on the Left,Lateral Foot . There was a T Contact Cast otal Procedure by [name], MD. Post procedure Diagnosis Wound #3: Same as Pre-Procedure Wound #4 Pre-procedure diagnosis of Wound #4 is a Diabetic Wound/Ulcer of the Lower Extremity located on the Right,Lateral Foot . There was a Three Layer Compression Therapy Procedure by [name], RN. Post procedure Diagnosis Wound #4: Same as Pre-Procedure Plan Follow-up Appointments: Return Appointment in 1 week. Bathing/ Shower/ Hygiene: May shower with protection but do not get wound dressing(s) wet. - may use cast protectors Edema Control - Lymphedema / SCD / Other: Elevate legs to the level of the heart or above for 30 minutes daily and/or when sitting, a frequency of: - throughout the day Avoid standing for long periods of time. Exercise regularly Off-Loading: T Contact Cast to Left Lower Extremity otal Open toe surgical shoe to: - both feet, pad left shoe with felt to offload lateral foot WOUND #3: - Foot Wound Laterality: Left, Lateral Peri-Wound Care: Sween Lotion (Moisturizing lotion) 1 x Per Week/ Discharge Instructions: Apply moisturizing lotion as directed Prim Dressing: Hydrofera Blue Classic Foam, 2x2 in 1 x Per Week/ ary Discharge Instructions: Apply to wound bed as instructed Secondary Dressing: Woven Gauze Sponge, Non-Sterile 4x4 in 1 x Per Week/ Discharge Instructions: Apply over primary dressing as directed. Secondary Dressing: ABD Pad, 5x9 1 x Per Week/ Discharge Instructions: Apply over primary dressing as directed. Secondary Dressing: T Contact  Cast 1 x Per Week/ otal WOUND #4: - Foot Wound Laterality: Right, Lateral Peri-Wound Care: Sween Lotion (Moisturizing lotion) 1 x Per Week/ Discharge Instructions: Apply moisturizing lotion as directed Prim Dressing: Hydrofera Blue Classic Foam, 2x2 in 1 x Per Week/ ary Discharge Instructions: Apply to wound bed as instructed Secondary Dressing: Woven Gauze Sponge, Non-Sterile 4x4 in 1 x Per Week/ Discharge Instructions: Apply over primary dressing as directed. Secondary Dressing: ABD Pad, 5x9 1 x Per Week/ Discharge Instructions: Apply over primary dressing as directed. Secondary Dressing: Felt 2.5 yds x 5.5 in 1 x Per Week/ Discharge Instructions: Apply over primary dressing as callous pad Com pression Wrap: FourPress (4 layer compression wrap) 1 x Per Week/ Discharge Instructions: apply unna boot first layer at upper portion of lower leg. Apply four layer compression as directed. 1. Continue with Hydrofera Blue  as the primary dressing 2. T contact cast on the left I am really hopeful to see some epithelialization of what looks to be healthy granulation he does not have underlying infection otal by the MRI Electronic Signature(s) Signed: 04/06/2020 3:00:47 PM By: [name] MD Entered By: [name] on 04/06/2020 14:00:09 -------------------------------------------------------------------------------- Total Contact Cast Details Patient Name: Date of Service: [name] 04/06/2020 12:45 PM Medical Record Number: [id number] Patient Account Number: [id number] Date of Birth/Sex: Treating RN: 1986-03-08 (35 y.o. [name] Primary Care Provider: [name] Other Clinician: Referring Provider: Treating Provider/Extender: [name] in Treatment: 23 T Contact Cast Applied for Wound Assessment: otal Wound #3 Left,Lateral Foot Performed By: Physician [name], MD Post Procedure Diagnosis Same as Pre-procedure Electronic  Signature(s) Signed: 04/06/2020 3:00:47 PM By: [name] MD Entered By: [name] on 04/06/2020 13:56:48 -------------------------------------------------------------------------------- SuperBill Details Patient Name: Date of Service: [name], [name] 04/06/2020 Medical Record Number: [id number] Patient Account Number: [id number] Date of Birth/Sex: Treating RN: Oct 11, 1985 (35 y.o. [name] Primary Care Provider: [name] Other Clinician: Referring Provider: Treating Provider/Extender: [name] in Treatment: 23 Diagnosis Coding ICD-10 Codes Code Description E11.621 Type 2 diabetes mellitus with foot ulcer L97.528 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ity L97.518 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ity Facility Procedures CPT4 Code: [id number] Description: 7597 - DEBRIDE WOUND 1ST 20 SQ CM OR < ICD-10 Diagnosis Description L97.528 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ity Modifier: Quantity: 1 CPT4 Code: 06004599 ( Description: Facility Use Only) [place] RT LEG Modifier: 57 Quantity: 1 Physician Procedures Electronic Signature(s) Signed: 04/07/2020 4:50:25 PM By: [name] MD Signed: 04/07/2020 5:50:13 PM By: [name] RN, BSN Previous Signature: 04/06/2020 3:00:47 PM Version By: [name] MD Entered By: [name] on 04/06/2020 15:05:15

## 2020-04-07 NOTE — Progress Notes (Signed)
[place], [name] ([id number]) Visit Report for 04/01/2020 SuperBill Details Patient Name: Date of Service: [name] 04/01/2020 Medical Record Number: [id number] Patient Account Number: [id number] Date of Birth/Sex: Treating RN: [birth year] (35 y.o. Charlean Merl, [name] Primary Care Provider: [name] Other Clinician: Referring Provider: Treating Provider/Extender: Prudencio Burly in Treatment: 22 Diagnosis Coding ICD-10 Codes Code Description [id number] Type 2 diabetes mellitus with foot ulcer L97.528 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ity L97.518 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ity Facility Procedures CPT4 Description Modifier Quantity Code [id number] [phone] BILATERAL: Application of multi-layer venous compression system; leg (below knee), including ankle and 1 foot. Electronic Signature(s) Signed: 04/01/2020 2:09:35 PM By: Fonnie Mu RN Signed: 04/07/2020 4:34:46 PM By: Lenda Kelp PA-C Entered By: Fonnie Mu on 04/01/2020 10:09:18

## 2020-04-13 ENCOUNTER — Encounter (HOSPITAL_BASED_OUTPATIENT_CLINIC_OR_DEPARTMENT_OTHER): Payer: BC Managed Care – PPO | Admitting: Internal Medicine

## 2020-04-13 ENCOUNTER — Other Ambulatory Visit: Payer: Self-pay

## 2020-04-13 DIAGNOSIS — E11621 Type 2 diabetes mellitus with foot ulcer: Secondary | ICD-10-CM | POA: Diagnosis not present

## 2020-04-15 ENCOUNTER — Other Ambulatory Visit: Payer: Self-pay

## 2020-04-15 ENCOUNTER — Encounter (HOSPITAL_BASED_OUTPATIENT_CLINIC_OR_DEPARTMENT_OTHER): Payer: BC Managed Care – PPO | Admitting: Physician Assistant

## 2020-04-15 DIAGNOSIS — E11621 Type 2 diabetes mellitus with foot ulcer: Secondary | ICD-10-CM | POA: Diagnosis not present

## 2020-04-16 NOTE — Progress Notes (Signed)
[place], [name] ([id number]) Visit Report for 04/15/2020 SuperBill Details Patient Name: Date of Service: [name] 04/15/2020 Medical Record Number: [id number] Patient Account Number: [id number] Date of Birth/Sex: Treating RN: Jan 21, 1986 (35 y.o. [name] Primary Care Provider: [name] Other Clinician: Referring Provider: Treating Provider/Extender: Prudencio Burly in Treatment: 24 Diagnosis Coding ICD-10 Codes Code Description [phone] Type 2 diabetes mellitus with foot ulcer L97.528 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ity L97.518 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ity Facility Procedures CPT4 Code Description Modifier Quantity [id number] (Facility Use Only) [phone] - APPLY MULTLAY COMPRS LWR RT LEG 1 Electronic Signature(s) Signed: 04/15/2020 5:15:08 PM By: Lenda Kelp PA-C Signed: 04/16/2020 5:28:50 PM By: Zandra Abts RN, BSN Entered By: Zandra Abts on 04/15/2020 10:34:56

## 2020-04-16 NOTE — Progress Notes (Signed)
[name], [name] ([id number]) Visit Report for 04/15/2020 Arrival Information Details Patient Name: Date of Service: [name] 04/15/2020 9:30 A M Medical Record Number: [id number] Patient Account Number: [id number] Date of Birth/Sex: Treating RN: [birth year] (35 y.o. [name] Primary Care [name]: [name] Other Clinician: Referring [name]: Treating [name]/Extender: [name] in Treatment: 24 Visit Information History Since Last Visit Added or deleted any medications: No Patient Arrived: Ambulatory Any new allergies or adverse reactions: No Arrival Time: 10:10 Had a fall or experienced change in No Accompanied By: alone activities of daily living that may affect Transfer Assistance: None risk of falls: Patient Identification Verified: Yes Signs or symptoms of abuse/neglect since last visito No Secondary Verification Process Completed: Yes Hospitalized since last visit: No Patient Requires Transmission-Based Precautions: No Implantable device outside of the clinic excluding No Patient Has Alerts: No cellular tissue based products placed in the center since last visit: Has Dressing in Place as Prescribed: No Has Compression in Place as Prescribed: No Pain Present Now: No Electronic Signature(s) Signed: 04/16/2020 5:28:50 PM By: Max Abts RN, BSN Entered By: [name] on 04/15/2020 10:32:29 -------------------------------------------------------------------------------- Compression Therapy Details Patient Name: Date of Service: [name], [name] 04/15/2020 9:30 A M Medical Record Number: [id number] Patient Account Number: [id number] Date of Birth/Sex: Treating RN: 1985-09-18 (35 y.o. [name] Primary Care [name]: [name] Other Clinician: Referring Annastasia Haskins: Treating [name]/Extender: [name] in Treatment: 24 Compression Therapy Performed for Wound Assessment: Wound  #4 Right,Lateral Foot Performed By: Clinician Max Abts, RN Compression Type: Four Layer Electronic Signature(s) Signed: 04/16/2020 5:28:50 PM By: Max Abts RN, BSN Entered By: [name] on 04/15/2020 10:33:56 -------------------------------------------------------------------------------- Encounter Discharge Information Details Patient Name: Date of Service: [name], [name] 04/15/2020 9:30 A M Medical Record Number: [id number] Patient Account Number: [id number] Date of Birth/Sex: Treating RN: 20-Jun-1985 (35 y.o. [name] Primary Care [name]: [name] Other Clinician: Referring [name]: Treating [name]/Extender: [name] in Treatment: 24 Encounter Discharge Information Items Discharge Condition: Stable Ambulatory Status: Ambulatory Discharge Destination: Home Transportation: Private Auto Accompanied By: alone Schedule Follow-up Appointment: Yes Clinical Summary of Care: Patient Declined Electronic Signature(s) Signed: 04/16/2020 5:28:50 PM By: Max Abts RN, BSN Entered By: [name] on 04/15/2020 10:34:48 -------------------------------------------------------------------------------- Wound Assessment Details Patient Name: Date of Service: [name], [name] 04/15/2020 9:30 A M Medical Record Number: [id number] Patient Account Number: [id number] Date of Birth/Sex: Treating RN: July 27, 1985 (35 y.o. [name] Primary Care [name]: [name] Other Clinician: Referring [name]: Treating [name]/Extender: [name] in Treatment: 24 Wound Status Wound Number: 4 Primary Etiology: Diabetic Wound/Ulcer of the Lower Extremity Wound Location: Right, Lateral Foot Wound Status: Open Wounding Event: Gradually Appeared Comorbid History: Type II Diabetes Date Acquired: 02/06/2020 Weeks Of Treatment: 8 Clustered Wound: No Wound Measurements Length: (cm) 0.6 Width: (cm)  0.6 Depth: (cm) 0.1 Area: (cm) 0.283 Volume: (cm) 0.028 % Reduction in Area: 69.2% % Reduction in Volume: 69.6% Epithelialization: Medium (34-66%) Tunneling: No Undermining: No Wound Description Classification: Grade 2 Wound Margin: Distinct, outline attached Exudate Amount: Medium Exudate Type: Serosanguineous Exudate Color: red, brown Foul Odor After Cleansing: No Slough/Fibrino No Wound Bed Granulation Amount: Large (67-100%) Exposed Structure Granulation Quality: Pink Fascia Exposed: No Necrotic Amount: None Present (0%) Fat Layer (Subcutaneous Tissue) Exposed: Yes Tendon Exposed: No Muscle Exposed: No Joint Exposed: No Bone Exposed: No Treatment Notes Wound #4 (  Foot) Wound Laterality: Right, Lateral Cleanser Peri-Wound Care Sween Lotion (Moisturizing lotion) Discharge Instruction: Apply moisturizing lotion as directed Topical Primary Dressing Hydrofera Blue Classic Foam, 2x2 in Discharge Instruction: Apply to wound bed as instructed Secondary Dressing Woven Gauze Sponge, Non-Sterile 4x4 in Discharge Instruction: Apply over primary dressing as directed. ABD Pad, 5x9 Discharge Instruction: Apply over primary dressing as directed. Felt 2.5 yds x 5.5 in Discharge Instruction: Apply over primary dressing as callous pad Secured With Compression Wrap FourPress (4 layer compression wrap) Discharge Instruction: apply unna boot first layer at upper portion of lower leg. Apply four layer compression as directed. Compression Stockings Add-Ons Electronic Signature(s) Signed: 04/16/2020 5:28:50 PM By: Max Abts RN, BSN Entered By: [name] on 04/15/2020 10:33:43

## 2020-04-16 NOTE — Progress Notes (Signed)
[place], [name] ([id number]) Visit Report for 04/13/2020 HPI Details Patient Name: Date of Service: [name] D 04/13/2020 8:15 A M Medical Record Number: [id number] Patient Account Number: [id number] Date of Birth/Sex: Treating RN: 01/05/1986 (35 y.o. 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 He comes in for review of a wound over the plantar fifth metatarsal head extending into the lateral part of the foot. He was followed for this previously by his podiatrist Dr. Cornelius Moras. As the patient tells his story he went to see podiatry first for a swelling he developed on the lateral part of his fifth metatarsal head in May. He states this was "open" by podiatry and the area closed. He was followed up in June and it was again opened callus removed and it closed promptly. There were plans being made for surgery on the fifth metatarsal head in June however his blood sugar was apparently too high for anesthesia. Apparently the area was debrided and opened again in June and it is never closed since. Looking over the records from podiatry I am really not able to follow this. It was clear when he was first seen it was before 5/14 at that point he already had a wound. By 5/17 the ulcer was resolved. I do not see anything about a procedure. On 5/28 noted to have pre-ulcerative moderate keratosis. X-ray noted 1/5 contracted toe and tailor's bunion and metatarsal deformity. On a visit date on 09/28/2018 the dorsal part of the left foot it healed and resolved. There was concern about swelling in his lower extremity he was sent to the ER.. As far as I can tell he was seen in the ER on 7/12 with an ulcer on his left foot. A DVT rule  out of the left leg was negative. I do not think I have complete records from podiatry but I am not able to verify the procedures this patient states he had. He states after the last procedure the wound has never closed although I am not able to follow this in the records I have from podiatry. He has not had a recent x-ray The patient has been using Neosporin on the wound. He is wearing a Darco shoe. He is still very active up on his foot working and exercising. Past medical history; type 2 diabetes ketosis-prone, leg swelling with a negative DVT study in July. Non-smoker ABI in our clinic was 0.85 on the left 10/16; substantial wound on the plantar left fifth met head extending laterally almost to the dorsal fifth MTP. We have been using silver alginate we gave him a Darco forefoot off loader. An x-ray did not show evidence of osteomyelitis did note soft tissue emphysema which I think was due to gas tracking through an open wound. There is no doubt in my mind he requires an MRI 10/23; MRI not booked until 3 November at the earliest this is largely due to his glucose sensor in the right arm. We have been using silver alginate. There has been an improvement 10/29; I am still not exactly sure when his MRI is booked for. He says it is the third but it is the 10th in epic. This definitely needs to be done. He is running a low-grade fever today but no other symptoms. No real  improvement in the 1 02/26/2019 patient presents today for a follow-up visit here in our clinic he is last been seen in the clinic on October 29. Subsequently we were working on getting MRI to evaluate and see what exactly was going on and where we would need to go from the standpoint of whether or not he had osteomyelitis and again what treatments were going be required. Subsequently the patient ended up being admitted to the hospital on 02/07/2019 and was discharged on 02/14/2019. This is a somewhat interesting admission with a  discharge diagnosis of pneumonia due to COVID-19 although he was positive for COVID-19 when tested at the urgent care but negative x2 when he was actually in the hospital. With that being said he did have acute respiratory failure with hypoxia and it was noted he also have a left foot ulceration with osteomyelitis. With that being said he did require oxygen for his pneumonia and I level 4 L. He was placed on antivirals and steroids for the COVID-19. He was also transferred to the [facility] at one point. Nonetheless he did subsequently discharged home and since being home has done much better in that regard. The CT angiogram did not show any pulmonary embolism. With regard to the osteomyelitis the patient was placed on vancomycin and Zosyn while in the hospital but has been changed to Augmentin at discharge. It was also recommended that he follow- up with wound care and podiatry. Podiatry however wanted him to see [name] according to the patient prior to them doing anything further. His hemoglobin A1c was 9.9 as noted in the hospital. Have an MRI of the left foot performed while in the hospital on 02/04/2019. 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 There is an overlying plantar open wound noted an abscess tracking back along the lateral aspect of the fifth metatarsal shaft. There is otherwise diffuse cellulitis and mild fasciitis without findings of polymyositis. The patient did have recently pneumonia secondary to COVID-19 I looked in the chart through epic and it does appear that the patient may need to have an additional x-ray just to ensure everything is cleared and that he has no airspace disease prior to putting him into the chamber. 03/05/2019; patient was readmitted to the clinic last week. He was hospitalized twice for a viral upper respiratory tract infection from 11/1 through 11/4 and  then 11/5 through 11/12 ultimately this turned out to be Covid pneumonitis. Although he was discharged on oxygen he is not using it. He says he feels fine. He has no exercise limitation no cough no sputum. His O2 sat in our clinic today was 100% on room air. 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 He received Vanco and Zosyn in the hospital and then was discharged on 2 weeks of Augmentin. I do not see any relevant cultures. He was supposed to follow-up with infectious disease but I do not see that he has an appointment. 12/8; patient saw Dr. Novella Olive of infectious disease last week. He felt that he had had adequate antibiotic therapy. He did not go to follow-up with Dr. Amalia Hailey of podiatry and I have again talked to him about the pros and cons of this. He does not want to consider a ray amputation of this time. He is aware of the risks  of recurrence, migration etc. He started HBO today and tolerated this well. He can complete the Augmentin that I gave him last week. I have looked over the lab work that Dr. Chana Bode ordered his C-reactive protein was 3.3 and his sedimentation rate was 17. The C-reactive protein is never really been measurably that high in this patient 12/15; not much change in the wound today however he has undermining along the lateral part of the foot again more extensively than last week. He has some rims of epithelialization. We have been using silver alginate. He is undergoing hyperbarics but did not dive today 12/18; in for his obligatory first total contact cast change. Unfortunately there was pus coming from the undermining area around his fifth metatarsal head. This was cultured but will preclude reapplication of a cast. He is seen in conjunction with HBO 12/24; patient had staph lugdunensis in the wound in the undermining area laterally last time. We put him on  doxycycline which should have covered this. The wound looks better today. I am going to give him another week of doxycycline before reattempting the total contact cast 12/31; the patient is completing antibiotics. Hemorrhagic debris in the distal part of the wound with some undermining distally. He also had hyper granulation. Extensive debridement with a #5 curette. The infected area that was on the lateral part of the fifth met head is closed over. I do not think he needs any more antibiotics. Patient was seen prior to HBO. Preparations for a total contact cast were made in the cast will be placed post hyperbarics 04/11/19; once again the patient arrives today without complaint. He had been in a cast all week noted that he had heavy drainage this week. This resulted in large raised areas of macerated tissue around the wound 1/14; wound bed looks better slightly smaller. Hydrofera Blue has been changing himself. He had a heavy drainage last week which caused a lot of maceration around the wound so I took him out of a total contact cast he says the drainage is actually better this week He is seen today in conjunction with HBO 1/21; returns to clinic. He was up in [place] for a day or 2 attending a funeral. He comes back in with the wound larger and with a large area of exposed bone. He had osteomyelitis and septic arthritis of the fifth left metatarsal head while he was in hospital. He received IV antibiotics in the hospital for a prolonged period of time then 3 weeks of Augmentin. Subsequently I gave him 2 weeks of doxycycline for more superficial wound infection. When I saw this last week the wound was smaller the surface of the wound looks satisfactory. 1/28; patient missed hyperbarics today. Bone biopsy I did last time showed Enterococcus faecalis and Staphylococcus lugdunensis . He has a wide area of exposed bone. We are going to use silver alginate as of today. I had another ethical discussion  with the patient. 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 In this situation I think the likelihood of healing this is low. Therefore I have recommended a ray amputation and with the patient's agreement I have referred him to Dr. Doran Durand. The other issue is that his compliance with hyperbarics has been minimal because of his work schedule and given his underlying decision I am going to stop this today READMISSION 10/24/2019 MRI 09/29/2019 left foot IMPRESSION: 1. Apparent skin ulceration inferior and lateral to the 5th metatarsal base with underlying heterogeneous T2  signal and enhancement in the subcutaneous fat. Small peripherally enhancing fluid collections along the plantar and lateral aspects of the 5th metatarsal base suspicious for abscesses. 2. Interval amputation through the mid 5th metatarsal with nonspecific low-level marrow edema and enhancement. Given the proximity to the adjacent soft tissue inflammatory changes, osteomyelitis cannot be excluded. 3. The additional bones appear unremarkable. MRI 09/29/2019 right foot IMPRESSION: 1. Soft tissue ulceration lateral to the 5th MTP joint. There is low-level T2 hyperintensity within the 4th and 5th metatarsal heads and adjacent proximal phalanges without abnormal T1 signal or cortical destruction. These findings are nonspecific and could be seen with early marrow edema, hyperemia or early osteomyelitis. No evidence of septic joint. 2. Mild tenosynovitis and synovial enhancement associated with the extensor digitorum tendons at the level of the midfoot. 3. Diffuse low-level muscular T2 hyperintensity and enhancement, most consistent with diabetic myopathy. LEFT FOOT BONE Methicillin resistant staphylococcus aureus Staphylococcus lugdunensis MIC MIC CIPROFLOXACIN >=8 RESISTANT Resistant <=0.5 SENSI... Sensitive CLINDAMYCIN <=0.25 SENS... Sensitive >=8 RESISTANT Resistant ERYTHROMYCIN >=8  RESISTANT Resistant >=8 RESISTANT Resistant GENTAMICIN <=0.5 SENSI... Sensitive <=0.5 SENSI... Sensitive Inducible Clindamycin NEGATIVE Sensitive NEGATIVE Sensitive OXACILLIN >=4 RESISTANT Resistant 2 SENSITIVE Sensitive RIFAMPIN <=0.5 SENSI... Sensitive <=0.5 SENSI... Sensitive TETRACYCLINE <=1 SENSITIVE Sensitive <=1 SENSITIVE Sensitive TRIMETH/SULFA <=10 SENSIT Sensitive <=10 SENSIT Sensitive ... Marland Kitchen.. VANCOMYCIN 1 SENSITIVE Sensitive <=0.5 SENSI... Sensitive Right foot bone . Component 3 wk ago Specimen Description BONE Special Requests RIGHT 4 METATARSAL SAMPLE B Gram Stain NO WBC SEEN NO ORGANISMS SEEN Culture RARE METHICILLIN RESISTANT STAPHYLOCOCCUS AUREUS NO ANAEROBES ISOLATED Performed at [facility], [place] [address], [place], [place] [address] Report Status 10/08/2019 FINAL Organism ID, Bacteria METHICILLIN RESISTANT STAPHYLOCOCCUS AUREUS Resulting Agency [facility] Susceptibility Methicillin resistant staphylococcus aureus MIC CIPROFLOXACIN >=8 RESISTANT Resistant CLINDAMYCIN <=0.25 SENS... Sensitive ERYTHROMYCIN >=8 RESISTANT Resistant GENTAMICIN <=0.5 SENSI... Sensitive Inducible Clindamycin NEGATIVE Sensitive OXACILLIN >=4 RESISTANT Resistant RIFAMPIN <=0.5 SENSI... Sensitive TETRACYCLINE <=1 SENSITIVE Sensitive TRIMETH/SULFA <=10 SENSIT Sensitive ... VANCOMYCIN 1 SENSITIVE Sensitive This is a patient we had in clinic earlier this year with a wound over his left fifth metatarsal head. 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 Doran Durand on 05/16/2019. He seems to have developed problems with wounds on his bilateral feet in June 2021 although he may have had problems earlier than  this. He was in an urgent care with a right foot ulcer on 09/26/2019 and given a course of doxycycline. This was apparently after having trouble getting into see orthopedics. He was seen by podiatry on 09/28/2019 noted to have bilateral lower extremity ulcers including the left lateral fifth metatarsal base and the right subfifth met head. It was noted that had purulent drainage at that time. He required hospitalization from 6/20 through 7/2. This was because of worsening right foot wounds. He underwent bilateral operative incision and drainage and bone biopsies bilaterally. Culture results are listed above. He has been referred back to clinic by Dr. Jacqualyn Posey of podiatry. He is also followed by Dr. Megan Salon who saw him yesterday. He was discharged from hospital on Zyvox Flagyl and Levaquin and yesterday changed to doxycycline Flagyl and Levaquin. His inflammatory markers on 6/26 showed a sedimentation rate of 129 and a C-reactive protein of 5. This  is improved to 14 and 1.3 respectively. This would indicate improvement. ABIs in our clinic today were 1.23 on the right and 1.20 on the left 11/01/2019 on evaluation today patient appears to be doing fairly well in regard to the wounds on his feet at this point. Fortunately there is no signs of active infection at this time. No fevers, chills, nausea, vomiting, or diarrhea. He currently is seeing infectious disease and still under their care at this point. Subsequently he also has both wounds which she has not been using collagen on as he did not receive that in his packaging he did not call us and let us know that. Apparently that just was missed on the order. Nonetheless we will get that straightened out today. 8/9-Patient returns for bilateral foot wounds, using Prisma with hydrogel moistened dressings, and the wounds appear stable. Patient using surgical shoes, avoiding much pressure or weightbearing as much as possible 8/16; patient has bilateral foot  wounds. 1 on the right lateral foot proximally the other is on the left mid lateral foot. Both required debridement of callus and thick skin around the wounds. We have been using silver collagen 8/27; patient has bilateral lateral foot wounds. The area on the left substantially surrounded by callus and dry skin. This was removed from the wound edge. The underlying wound is small. The area on the right measured somewhat smaller today. 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 Unfortunately I did not update his antibiotics during today's visit. 9/10 I reviewed Dr. Hale Bogus last notes he felt he had completed antibiotics his inflammatory markers were reasonably well controlled. He has a small wound on the lateral left foot and a tiny area on the right which is just above closed. He is using Hydrofera Blue with border foam he has bilateral surgical shoes 9/24; 2 week f/u. doing well. right foot is closed. left foot still undermined. 10/14; right foot remains closed at the fifth met head. The area over the base of the left fifth metatarsal has a small open area but considerable undermining towards the plantar foot. Thick callus skin around this suggests an adequate pressure relief. We have talked about this. He says he is going to go back into his cam boot. I suggested a total contact cast he did not seem enamored with this suggestion 10/26; left foot base of the fifth metatarsal. Same condition as last time. He has skin over the area with an open wound however the skin is not adherent. He went to see Dr. Earleen Newport who did an x-ray and culture of his foot I have not reviewed the x-ray but the patient was not told anything. He is on doxycycline 11/11; since the patient was last here he was in the emergency room on 10/30 he was concerned about swelling in the left foot. They did not do any cultures or x-rays. They changed his antibiotics to  cephalexin. Previous culture showed group B strep. The cephalexin is appropriate as doxycycline has less than predictable coverage. Arrives in clinic today with swelling over this area under the wound. He also has a new wound on the right fifth metatarsal head 11/18; the patient has a difficult wound on the lateral aspect of the left fifth metatarsal head. The wound was almost ballotable last week I opened it slightly expecting to see purulence however there was just bleeding. I cultured this this was negative. X-ray unchanged. We are trying to get an  MRI but I am not sure were going to be able to get this through his insurance. He also has an area on the right lateral fifth metatarsal head this looks healthier 12/3; the patient finally got our MRI. Surprisingly this did not show osteomyelitis. I did show the soft tissue ulceration at the lateral plantar aspect of the fifth metatarsal base with a tiny residual 6 mm abscess overlying the superficial fascia I have tried to culture this area I have not been able to get this to grow anything. Nevertheless the protruding tissue looks aggravated. I suspect we should try to treat the underlying "abscess with broad-spectrum antibiotics. I am going to start him on Levaquin and Flagyl. He has much less edema in his legs and I am going to continue to wrap his legs and see him weekly 12/10. I started Levaquin and Flagyl on him last week. He just picked up the Flagyl apparently there was some delay. The worry is the wound on the left fifth metatarsal base which is substantial and worsening. His foot looks like he inverts at the ankle making this a weightbearing surface. Certainly no improvement in fact I think the measurements of this are somewhat worse. We have been using 12/17; he apparently just got the Levaquin yesterday this is 2 weeks after the fact. He has completed the Flagyl. The area over the left fifth metatarsal base still has protruding granulation  tissue although it does not look quite as bad as it did some weeks ago. He has severe bilateral lymphedema although we have not been treating him for wounds on his legs this is definitely going to require compression. There was so much edema in the left I did not wish to put him in a total contact cast today. I am going to increase his compression from 3-4 layer. The area on the right lateral fifth met head actually look quite good and superficial. 12/23; patient arrived with callus on the right fifth met head and the substantial hyper granulated callused wound on the base of his fifth metatarsal. He says he is completing his Levaquin in 2 days but I do not think that adds up with what I gave him but I will have to double check this. We are using Hydrofera Blue on both areas. My plan is to put the left leg in a cast the week after New Year's 04/06/2020; patient's wounds about the same. Right lateral fifth metatarsal head and left lateral foot over the base of the fifth metatarsal. There is undermining on the left lateral foot which I removed before application of total contact cast continuing with Hydrofera Blue new. Patient tells me he was seen by endocrinology today lab work was done [Dr. Kerr]. Also wondering whether he was referred to cardiology. I went over some lab work from previously does not have chronic renal failure certainly not nephrotic range proteinuria he does have very poorly controlled diabetes but this is not his most updated lab work. Hemoglobin A1c has been over 11 1/10; the patient had a considerable amount of leakage towards mid part of his left foot with macerated skin however the wound surface looks better the area on the right lateral fifth met head is better as well. I am going to change the dressing on the left foot under the total contact cast to silver alginate, continue with Hydrofera Blue on the right. Electronic Signature(s) Signed: 04/16/2020 5:01:02 PM By: [name] MD Entered By: [name] on 04/13/2020 09:11:55 --------------------------------------------------------------------------------  Physical Exam Details Patient Name: Date of Service: [name] D 04/13/2020 8:15 A M Medical Record Number: [id number] Patient Account Number: [id number] Date of Birth/Sex: Treating RN: 05-26-1985 (35 y.o. [name] Primary Care Provider: [name] Other Clinician: Referring Provider: Treating Provider/Extender: [name] in Treatment: 24 Constitutional Patient is hypertensive.. Pulse regular and within target range for patient.Marland Kitchen Respirations regular, non-labored and within target range.. Temperature is normal and within the target range for the patient.Marland Kitchen Appears in no distress. Notes Wound exam; left lateral fifth metatarsal base. The wound itself looks fairly good. The lateral part of this wound is at a level commensurate with the skin the medial part still has some depth. However the granulation looks quite good. Maceration around the wound Electronic Signature(s) Signed: 04/16/2020 5:01:02 PM By: [name] MD Entered By: [name] on 04/13/2020 09:13:17 -------------------------------------------------------------------------------- Physician Orders Details Patient Name: Date of Service: [name], [name] 04/13/2020 8:15 A M Medical Record Number: [id number] Patient Account Number: [id number] Date of Birth/Sex: Treating RN: 09-04-1985 (35 y.o. [name] Primary Care Provider: [name] Other Clinician: Referring Provider: Treating Provider/Extender: [name] in Treatment: 24 Verbal / Phone Orders: No Diagnosis Coding ICD-10 Coding Code Description E11.621 Type 2 diabetes mellitus with foot ulcer L97.528 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ity L97.518 Non-pressure chronic ulcer of other part of  right foot with other specified severity Follow-up Appointments Return Appointment in 1 week. Bathing/ Shower/ Hygiene May shower with protection but do not get wound dressing(s) wet. - may use cast protectors Edema Control - Lymphedema / SCD / Other Bilateral Lower Extremities Elevate legs to the level of the heart or above for 30 minutes daily and/or when sitting, a frequency of: - throughout the day Avoid standing for long periods of time. Exercise regularly Off-Loading Total Contact Cast to Left Lower Extremity Open toe surgical shoe to: - both feet, pad left shoe with felt to offload lateral foot Wound Treatment Wound #3 - Foot Wound Laterality: Left, Lateral Peri-Wound Care: Zinc Oxide Ointment 30g tube 1 x Per Week Discharge Instructions: Apply Zinc Oxide to periwound with each dressing change Peri-Wound Care: Sween Lotion (Moisturizing lotion) 1 x Per Week Discharge Instructions: Apply moisturizing lotion as directed Prim Dressing: KerraCel Ag Gelling Fiber Dressing, 4x5 in (silver alginate) 1 x Per Week ary Discharge Instructions: Apply silver alginate to wound bed as instructed Secondary Dressing: ABD Pad, 5x9 1 x Per Week Discharge Instructions: Apply over primary dressing as directed. Secondary Dressing: Drawtex 4x4 in 1 x Per Week Discharge Instructions: Apply over primary dressing as directed. Secondary Dressing: T Contact Cast otal 1 x Per Week Wound #4 - Foot Wound Laterality: Right, Lateral Peri-Wound Care: Sween Lotion (Moisturizing lotion) 1 x Per Week Discharge Instructions: Apply moisturizing lotion as directed Prim Dressing: Hydrofera Blue Classic Foam, 2x2 in 1 x Per Week ary Discharge Instructions: Apply to wound bed as instructed Secondary Dressing: Woven Gauze Sponge, Non-Sterile 4x4 in 1 x Per Week Discharge Instructions: Apply over primary dressing as directed. Secondary Dressing: ABD Pad, 5x9 1 x Per Week Discharge Instructions: Apply over primary  dressing as directed. Secondary Dressing: Felt 2.5 yds x 5.5 in 1 x Per Week Discharge Instructions: Apply over primary dressing as callous pad Compression Wrap: FourPress (4 layer compression wrap) 1 x Per Week Discharge Instructions: apply unna boot first layer at upper portion of lower leg. Apply four  layer compression as directed. Electronic Signature(s) Signed: 04/13/2020 5:04:19 PM By: [name] RN, BSN Signed: 04/16/2020 5:01:02 PM By: [name] MD Entered By: [name] on 04/13/2020 09:07:20 -------------------------------------------------------------------------------- Problem List Details Patient Name: Date of Service: [name], [name] 04/13/2020 8:15 A M Medical Record Number: [id number] Patient Account Number: [id number] Date of Birth/Sex: Treating RN: [birth year] (35 y.o. 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, right ankle and foot 10/24/2019 10/24/2019 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, left ankle and foot 10/24/2019 10/24/2019 B95.62 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 10/24/2019 10/24/2019 classified elsewhere Resolved Problems Electronic Signature(s) Signed: 04/16/2020 5:01:02 PM By: [name] MD Entered By: [name] on 04/13/2020  09:09:17 -------------------------------------------------------------------------------- Progress Note Details Patient Name: Date of Service: [name], [name] 04/13/2020 8:15 A M Medical Record Number: [id number] Patient Account Number: [id number] Date of Birth/Sex: Treating RN: Apr 14, 1985 (35 y.o. 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 He comes in for review of a wound over the plantar fifth metatarsal head extending into the lateral part of the foot. He was followed for this previously by his podiatrist Dr. Cornelius Moras. As the patient tells his story he went to see podiatry first for a swelling he developed on the lateral part of his fifth metatarsal head in May. He states this was "open" by podiatry and the area closed. He was followed up in June and it was again opened callus removed and it closed promptly. There were plans being made for surgery on the fifth metatarsal head in June however his blood sugar was apparently too high for anesthesia. Apparently the area was debrided and opened again in June and it is never closed since. Looking over the records from podiatry I am really not able to follow this. It was clear when he was first seen it was before 5/14 at that point he already had a wound. By 5/17 the ulcer was resolved. I do not see anything about a procedure. On 5/28 noted to have pre-ulcerative moderate keratosis. X-ray noted 1/5 contracted toe and tailor's bunion and metatarsal deformity. On a visit date on 09/28/2018 the dorsal part of the left foot it healed and resolved. There was concern about swelling in his lower extremity he was sent to the ER.. As far as I can tell he was seen in the ER on 7/12  with an ulcer on his left foot. A DVT rule out of the left leg was negative. I do not think I have complete records from podiatry but I am not able to verify the procedures this patient states he had. He states after the last procedure the wound has never closed although I am not able to follow this in the records I have from podiatry. He has not had a  recent x-ray The patient has been using Neosporin on the wound. He is wearing a Darco shoe. He is still very active up on his foot working and exercising. Past medical history; type 2 diabetes ketosis-prone, leg swelling with a negative DVT study in July. Non-smoker ABI in our clinic was 0.85 on the left 10/16; substantial wound on the plantar left fifth met head extending laterally almost to the dorsal fifth MTP. We have been using silver alginate we gave him a Darco forefoot off loader. An x-ray did not show evidence of osteomyelitis did note soft tissue emphysema which I think was due to gas tracking through an open wound. There is no doubt in my mind he requires an MRI 10/23; MRI not booked until 3 November at the earliest this is largely due to his glucose sensor in the right arm. We have been using silver alginate. There has been an improvement 10/29; I am still not exactly sure when his MRI is booked for. He says it is the third but it is the 10th in epic. This definitely needs to be done. He is running a low-grade fever today but no other symptoms. No real improvement in the 1 02/26/2019 patient presents today for a follow-up visit here in our clinic he is last been seen in the clinic on October 29. Subsequently we were working on getting MRI to evaluate and see what exactly was going on and where we would need to go from the standpoint of whether or not he had osteomyelitis and again what treatments were going be required. Subsequently the patient ended up being admitted to the hospital on 02/07/2019 and was discharged on 02/14/2019. This is a  somewhat interesting admission with a discharge diagnosis of pneumonia due to COVID-19 although he was positive for COVID-19 when tested at the urgent care but negative x2 when he was actually in the hospital. With that being said he did have acute respiratory failure with hypoxia and it was noted he also have a left foot ulceration with osteomyelitis. With that being said he did require oxygen for his pneumonia and I level 4 L. He was placed on antivirals and steroids for the COVID-19. He was also transferred to the [facility] at one point. Nonetheless he did subsequently discharged home and since being home has done much better in that regard. The CT angiogram did not show any pulmonary embolism. With regard to the osteomyelitis the patient was placed on vancomycin and Zosyn while in the hospital but has been changed to Augmentin at discharge. It was also recommended that he follow- up with wound care and podiatry. Podiatry however wanted him to see [name] according to the patient prior to them doing anything further. His hemoglobin A1c was 9.9 as noted in the hospital. Have an MRI of the left foot performed while in the hospital on 02/04/2019. 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 There is an overlying plantar open wound noted an abscess tracking back along the lateral aspect of the fifth metatarsal shaft. There is otherwise diffuse cellulitis and mild fasciitis without findings of polymyositis. The patient did have recently pneumonia secondary to COVID-19 I looked in the chart through epic and it does appear that the patient may need to have an additional x-ray just to ensure everything is cleared and that he has no airspace disease prior to putting him into the chamber. 03/05/2019; patient was readmitted  to the clinic last week. He was hospitalized twice for a viral upper respiratory tract  infection from 11/1 through 11/4 and then 11/5 through 11/12 ultimately this turned out to be Covid pneumonitis. Although he was discharged on oxygen he is not using it. He says he feels fine. He has no exercise limitation no cough no sputum. His O2 sat in our clinic today was 100% on room air. 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 He received Vanco and Zosyn in the hospital and then was discharged on 2 weeks of Augmentin. I do not see any relevant cultures. He was supposed to follow-up with infectious disease but I do not see that he has an appointment. 12/8; patient saw Dr. Novella Olive of infectious disease last week. He felt that he had had adequate antibiotic therapy. He did not go to follow-up with Dr. Amalia Hailey of podiatry and I have again talked to him about the pros and cons of this. He does not want to consider a ray amputation of this time. He is aware of the risks of recurrence, migration etc. He started HBO today and tolerated this well. He can complete the Augmentin that I gave him last week. I have looked over the lab work that Dr. Chana Bode ordered his C-reactive protein was 3.3 and his sedimentation rate was 17. The C-reactive protein is never really been measurably that high in this patient 12/15; not much change in the wound today however he has undermining along the lateral part of the foot again more extensively than last week. He has some rims of epithelialization. We have been using silver alginate. He is undergoing hyperbarics but did not dive today 12/18; in for his obligatory first total contact cast change. Unfortunately there was pus coming from the undermining area around his fifth metatarsal head. This was cultured but will preclude reapplication of a cast. He is seen in conjunction with HBO 12/24; patient had staph lugdunensis in the wound in the undermining area  laterally last time. We put him on doxycycline which should have covered this. The wound looks better today. I am going to give him another week of doxycycline before reattempting the total contact cast 12/31; the patient is completing antibiotics. Hemorrhagic debris in the distal part of the wound with some undermining distally. He also had hyper granulation. Extensive debridement with a #5 curette. The infected area that was on the lateral part of the fifth met head is closed over. I do not think he needs any more antibiotics. Patient was seen prior to HBO. Preparations for a total contact cast were made in the cast will be placed post hyperbarics 04/11/19; once again the patient arrives today without complaint. He had been in a cast all week noted that he had heavy drainage this week. This resulted in large raised areas of macerated tissue around the wound 1/14; wound bed looks better slightly smaller. Hydrofera Blue has been changing himself. He had a heavy drainage last week which caused a lot of maceration around the wound so I took him out of a total contact cast he says the drainage is actually better this week He is seen today in conjunction with HBO 1/21; returns to clinic. He was up in [place] for a day or 2 attending a funeral. He comes back in with the wound larger and with a large area of exposed bone. He had osteomyelitis  and septic arthritis of the fifth left metatarsal head while he was in hospital. He received IV antibiotics in the hospital for a prolonged period of time then 3 weeks of Augmentin. Subsequently I gave him 2 weeks of doxycycline for more superficial wound infection. When I saw this last week the wound was smaller the surface of the wound looks satisfactory. 1/28; patient missed hyperbarics today. Bone biopsy I did last time showed Enterococcus faecalis and Staphylococcus lugdunensis . He has a wide area of exposed bone. We are going to use silver alginate as of  today. I had another ethical discussion with the patient. 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 In this situation I think the likelihood of healing this is low. Therefore I have recommended a ray amputation and with the patient's agreement I have referred him to Dr. Doran Durand. The other issue is that his compliance with hyperbarics has been minimal because of his work schedule and given his underlying decision I am going to stop this today READMISSION 10/24/2019 MRI 09/29/2019 left foot IMPRESSION: 1. Apparent skin ulceration inferior and lateral to the 5th metatarsal base with underlying heterogeneous T2 signal and enhancement in the subcutaneous fat. Small peripherally enhancing fluid collections along the plantar and lateral aspects of the 5th metatarsal base suspicious for abscesses. 2. Interval amputation through the mid 5th metatarsal with nonspecific low-level marrow edema and enhancement. Given the proximity to the adjacent soft tissue inflammatory changes, osteomyelitis cannot be excluded. 3. The additional bones appear unremarkable. MRI 09/29/2019 right foot IMPRESSION: 1. Soft tissue ulceration lateral to the 5th MTP joint. There is low-level T2 hyperintensity within the 4th and 5th metatarsal heads and adjacent proximal phalanges without abnormal T1 signal or cortical destruction. These findings are nonspecific and could be seen with early marrow edema, hyperemia or early osteomyelitis. No evidence of septic joint. 2. Mild tenosynovitis and synovial enhancement associated with the extensor digitorum tendons at the level of the midfoot. 3. Diffuse low-level muscular T2 hyperintensity and enhancement, most consistent with diabetic myopathy. LEFT FOOT BONE Methicillin resistant staphylococcus aureus Staphylococcus lugdunensis MIC MIC CIPROFLOXACIN >=8 RESISTANT Resistant <=0.5 SENSI... Sensitive CLINDAMYCIN <=0.25 SENS... Sensitive >=8  RESISTANT Resistant ERYTHROMYCIN >=8 RESISTANT Resistant >=8 RESISTANT Resistant GENTAMICIN <=0.5 SENSI... Sensitive <=0.5 SENSI... Sensitive Inducible Clindamycin NEGATIVE Sensitive NEGATIVE Sensitive OXACILLIN >=4 RESISTANT Resistant 2 SENSITIVE Sensitive RIFAMPIN <=0.5 SENSI... Sensitive <=0.5 SENSI... Sensitive TETRACYCLINE <=1 SENSITIVE Sensitive <=1 SENSITIVE Sensitive TRIMETH/SULFA <=10 SENSIT Sensitive <=10 SENSIT Sensitive ... Marland Kitchen.. VANCOMYCIN 1 SENSITIVE Sensitive <=0.5 SENSI... Sensitive Right foot bone . Component 3 wk ago Specimen Description BONE Special Requests RIGHT 4 METATARSAL SAMPLE B Gram Stain NO WBC SEEN NO ORGANISMS SEEN Culture RARE METHICILLIN RESISTANT STAPHYLOCOCCUS AUREUS NO ANAEROBES ISOLATED Performed at [facility], [place] [address] [place], [place] [address] Report Status 10/08/2019 FINAL Organism ID, Bacteria METHICILLIN RESISTANT STAPHYLOCOCCUS AUREUS Resulting Agency [facility] Susceptibility Methicillin resistant staphylococcus aureus MIC CIPROFLOXACIN >=8 RESISTANT Resistant CLINDAMYCIN <=0.25 SENS... Sensitive ERYTHROMYCIN >=8 RESISTANT Resistant GENTAMICIN <=0.5 SENSI... Sensitive Inducible Clindamycin NEGATIVE Sensitive OXACILLIN >=4 RESISTANT Resistant RIFAMPIN <=0.5 SENSI... Sensitive TETRACYCLINE <=1 SENSITIVE Sensitive TRIMETH/SULFA <=10 SENSIT Sensitive ... VANCOMYCIN 1 SENSITIVE Sensitive This is a patient we had in clinic earlier this year with a wound over his left fifth metatarsal head. 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 Doran Durand on 05/16/2019. He seems to have developed problems with wounds on his bilateral feet in June 2021 although he  may have had problems earlier than this. He was in an urgent care with a right foot ulcer on 09/26/2019 and given a course of doxycycline. This was apparently after having trouble getting into see orthopedics. He was seen by podiatry on 09/28/2019 noted to have bilateral lower extremity ulcers including the left lateral fifth metatarsal base and the right subfifth met head. It was noted that had purulent drainage at that time. He required hospitalization from 6/20 through 7/2. This was because of worsening right foot wounds. He underwent bilateral operative incision and drainage and bone biopsies bilaterally. Culture results are listed above. He has been referred back to clinic by Dr. Jacqualyn Posey of podiatry. He is also followed by Dr. Megan Salon who saw him yesterday. He was discharged from hospital on Zyvox Flagyl and Levaquin and yesterday changed to doxycycline Flagyl and Levaquin. His inflammatory markers on 6/26 showed a sedimentation rate of 129 and a C-reactive protein of 5. This is improved to 14 and 1.3 respectively. This would indicate improvement. ABIs in our clinic today were 1.23 on the right and 1.20 on the left 11/01/2019 on evaluation today patient appears to be doing fairly well in regard to the wounds on his feet at this point. Fortunately there is no signs of active infection at this time. No fevers, chills, nausea, vomiting, or diarrhea. He currently is seeing infectious disease and still under their care at this point. Subsequently he also has both wounds which she has not been using collagen on as he did not receive that in his packaging he did not call us and let us know that. Apparently that just was missed on the order. Nonetheless we will get that straightened out today. 8/9-Patient returns for bilateral foot wounds, using Prisma with hydrogel moistened dressings, and the wounds appear stable. Patient using surgical shoes, avoiding much pressure or weightbearing as much as  possible 8/16; patient has bilateral foot wounds. 1 on the right lateral foot proximally the other is on the left mid lateral foot. Both required debridement of callus and thick skin around the wounds. We have been using silver collagen 8/27; patient has bilateral lateral foot wounds. The area on the left substantially surrounded by callus and dry skin. This was removed from the wound edge. The underlying wound is small. The area on the right measured somewhat smaller today. 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 Unfortunately I did not update his antibiotics during today's visit. 9/10 I reviewed Dr. Hale Bogus last notes he felt he had completed antibiotics his inflammatory markers were reasonably well controlled. He has a small wound on the lateral left foot and a tiny area on the right which is just above closed. He is using Hydrofera Blue with border foam he has bilateral surgical shoes 9/24; 2 week f/u. doing well. right foot is closed. left foot still undermined. 10/14; right foot remains closed at the fifth met head. The area over the base of the left fifth metatarsal has a small open area but considerable undermining towards the plantar foot. Thick callus skin around this suggests an adequate pressure relief. We have talked about this. He says he is going to go back into his cam boot. I suggested a total contact  cast he did not seem enamored with this suggestion 10/26; left foot base of the fifth metatarsal. Same condition as last time. He has skin over the area with an open wound however the skin is not adherent. He went to see Dr. Earleen Newport who did an x-ray and culture of his foot I have not reviewed the x-ray but the patient was not told anything. He is on doxycycline 11/11; since the patient was last here he was in the emergency room on 10/30 he was concerned about swelling in the left foot. They did not do any cultures  or x-rays. They changed his antibiotics to cephalexin. Previous culture showed group B strep. The cephalexin is appropriate as doxycycline has less than predictable coverage. Arrives in clinic today with swelling over this area under the wound. He also has a new wound on the right fifth metatarsal head 11/18; the patient has a difficult wound on the lateral aspect of the left fifth metatarsal head. The wound was almost ballotable last week I opened it slightly expecting to see purulence however there was just bleeding. I cultured this this was negative. X-ray unchanged. We are trying to get an MRI but I am not sure were going to be able to get this through his insurance. He also has an area on the right lateral fifth metatarsal head this looks healthier 12/3; the patient finally got our MRI. Surprisingly this did not show osteomyelitis. I did show the soft tissue ulceration at the lateral plantar aspect of the fifth metatarsal base with a tiny residual 6 mm abscess overlying the superficial fascia I have tried to culture this area I have not been able to get this to grow anything. Nevertheless the protruding tissue looks aggravated. I suspect we should try to treat the underlying "abscess with broad-spectrum antibiotics. I am going to start him on Levaquin and Flagyl. He has much less edema in his legs and I am going to continue to wrap his legs and see him weekly 12/10. I started Levaquin and Flagyl on him last week. He just picked up the Flagyl apparently there was some delay. The worry is the wound on the left fifth metatarsal base which is substantial and worsening. His foot looks like he inverts at the ankle making this a weightbearing surface. Certainly no improvement in fact I think the measurements of this are somewhat worse. We have been using 12/17; he apparently just got the Levaquin yesterday this is 2 weeks after the fact. He has completed the Flagyl. The area over the left fifth  metatarsal base still has protruding granulation tissue although it does not look quite as bad as it did some weeks ago. He has severe bilateral lymphedema although we have not been treating him for wounds on his legs this is definitely going to require compression. There was so much edema in the left I did not wish to put him in a total contact cast today. I am going to increase his compression from 3-4 layer. The area on the right lateral fifth met head actually look quite good and superficial. 12/23; patient arrived with callus on the right fifth met head and the substantial hyper granulated callused wound on the base of his fifth metatarsal. He says he is completing his Levaquin in 2 days but I do not think that adds up with what I gave him but I will have to double check this. We are using Hydrofera Blue on both areas. My plan is to put the  left leg in a cast the week after New Year's 04/06/2020; patient's wounds about the same. Right lateral fifth metatarsal head and left lateral foot over the base of the fifth metatarsal. There is undermining on the left lateral foot which I removed before application of total contact cast continuing with Hydrofera Blue new. Patient tells me he was seen by endocrinology today lab work was done [Dr. Kerr]. Also wondering whether he was referred to cardiology. I went over some lab work from previously does not have chronic renal failure certainly not nephrotic range proteinuria he does have very poorly controlled diabetes but this is not his most updated lab work. Hemoglobin A1c has been over 11 1/10; the patient had a considerable amount of leakage towards mid part of his left foot with macerated skin however the wound surface looks better the area on the right lateral fifth met head is better as well. I am going to change the dressing on the left foot under the total contact cast to silver alginate, continue with Hydrofera Blue on the  right. Objective Constitutional Patient is hypertensive.. Pulse regular and within target range for patient.Marland Kitchen Respirations regular, non-labored and within target range.. Temperature is normal and within the target range for the patient.Marland Kitchen Appears in no distress. Vitals Time Taken: 8:27 AM, Height: 77 in, Weight: 280 lbs, BMI: 33.2, Temperature: 97.8 F, Pulse: 87 bpm, Respiratory Rate: 17 breaths/min, Blood Pressure: 147/97 mmHg, Capillary Blood Glucose: 130 mg/dl. General Notes: Wound exam; left lateral fifth metatarsal base. The wound itself looks fairly good. The lateral part of this wound is at a level commensurate with the skin the medial part still has some depth. However the granulation looks quite good. ooMaceration around the wound Integumentary (Hair, Skin) Wound #3 status is Open. Original cause of wound was Trauma. The wound is located on the Left,Lateral Foot. The wound measures 3.1cm length x 3cm width x 1cm depth; 7.304cm^2 area and 7.304cm^3 volume. There is Fat Layer (Subcutaneous Tissue) exposed. There is no tunneling noted, however, there is undermining starting at 9:00 and ending at 11:00 with a maximum distance of 1cm. There is a large amount of serosanguineous drainage noted. The wound margin is distinct with the outline attached to the wound base. There is large (67-100%) red, pink, hyper - granulation within the wound bed. There is a small (1- 33%) amount of necrotic tissue within the wound bed including Adherent Slough. Wound #4 status is Open. Original cause of wound was Gradually Appeared. The wound is located on the Right,Lateral Foot. The wound measures 0.6cm length x 0.6cm width x 0.1cm depth; 0.283cm^2 area and 0.028cm^3 volume. There is Fat Layer (Subcutaneous Tissue) exposed. There is no tunneling or undermining noted. There is a medium amount of serosanguineous drainage noted. The wound margin is distinct with the outline attached to the wound base. There is  large (67-100%) red, pink granulation within the wound bed. There is no necrotic tissue within the wound bed. 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 There was a Four Layer Compression Therapy Procedure by [name], RN. Post procedure Diagnosis Wound #4: Same as Pre-Procedure Wound #3 Pre-procedure diagnosis of Wound #3 is a Diabetic Wound/Ulcer of the  Lower Extremity located on the Left,Lateral Foot . There was a T Contact Cast otal Procedure by [name], MD. Post procedure Diagnosis Wound #3: Same as Pre-Procedure Plan Follow-up Appointments: Return Appointment in 1 week. Bathing/ Shower/ Hygiene: May shower with protection but do not get wound dressing(s) wet. - may use cast protectors Edema Control - Lymphedema / SCD / Other: Elevate legs to the level of the heart or above for 30 minutes daily and/or when sitting, a frequency of: - throughout the day Avoid standing for long periods of time. Exercise regularly Off-Loading: T Contact Cast to Left Lower Extremity otal Open toe surgical shoe to: - both feet, pad left shoe with felt to offload lateral foot WOUND #3: - Foot Wound Laterality: Left, Lateral Peri-Wound Care: Zinc Oxide Ointment 30g tube 1 x Per Week/ Discharge Instructions: Apply Zinc Oxide to periwound with each dressing change Peri-Wound Care: Sween Lotion (Moisturizing lotion) 1 x Per Week/ Discharge Instructions: Apply moisturizing lotion as directed Prim Dressing: KerraCel Ag Gelling Fiber Dressing, 4x5 in (silver alginate) 1 x Per Week/ ary Discharge Instructions: Apply silver alginate to wound bed as  instructed Secondary Dressing: ABD Pad, 5x9 1 x Per Week/ Discharge Instructions: Apply over primary dressing as directed. Secondary Dressing: Drawtex 4x4 in 1 x Per Week/ Discharge Instructions: Apply over primary dressing as directed. Secondary Dressing: T Contact Cast 1 x Per Week/ otal WOUND #4: - Foot Wound Laterality: Right, Lateral Peri-Wound Care: Sween Lotion (Moisturizing lotion) 1 x Per Week/ Discharge Instructions: Apply moisturizing lotion as directed Prim Dressing: Hydrofera Blue Classic Foam, 2x2 in 1 x Per Week/ ary Discharge Instructions: Apply to wound bed as instructed Secondary Dressing: Woven Gauze Sponge, Non-Sterile 4x4 in 1 x Per Week/ Discharge Instructions: Apply over primary dressing as directed. Secondary Dressing: ABD Pad, 5x9 1 x Per Week/ Discharge Instructions: Apply over primary dressing as directed. Secondary Dressing: Felt 2.5 yds x 5.5 in 1 x Per Week/ Discharge Instructions: Apply over primary dressing as callous pad Com pression Wrap: FourPress (4 layer compression wrap) 1 x Per Week/ Discharge Instructions: apply unna boot first layer at upper portion of lower leg. Apply four layer compression as directed. #1 I change the primary dressing to silver alginate. We put zinc oxide around the wound still under the total contact cast on the left 2. Still Hydrofera Blue on the right as this wound appears to be getting better Electronic Signature(s) Signed: 04/16/2020 5:01:02 PM By: [name] MD Entered By: [name] on 04/13/2020 09:14:50 -------------------------------------------------------------------------------- Total Contact Cast Details Patient Name: Date of Service: [name] D 04/13/2020 8:15 A M Medical Record Number: [id number] Patient Account Number: [id number] Date of Birth/Sex: Treating RN: Aug 11, 1985 (35 y.o. [name] Primary Care Provider: [name] Other Clinician: Referring Provider: Treating  Provider/Extender: [name] in Treatment: 24 T Contact Cast Applied for Wound Assessment: otal Wound #3 Left,Lateral Foot Performed By: Physician [name], MD Post Procedure Diagnosis Same as Pre-procedure Electronic Signature(s) Signed: 04/16/2020 5:01:02 PM By: [name] MD Entered By: [name] on 04/13/2020 09:13:53 -------------------------------------------------------------------------------- SuperBill Details Patient Name: Date of Service: [name], [name] 04/13/2020 Medical Record Number: [id number] Patient Account Number: [id number] Date of Birth/Sex: Treating RN: [birth year] (35 y.o. [name] Primary Care Provider: [name] Other Clinician: Referring Provider: Treating Provider/Extender: [name] in Treatment: 24 Diagnosis Coding ICD-10 Codes Code Description [phone] Type 2 diabetes mellitus with foot ulcer L97.528  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ity L97.518 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ity Facility Procedures CPT4 Code: [id number] Description: [id number] - APPLY TOTAL CONTACT LEG CAST ICD-10 Diagnosis Description L97.528 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ity Modifier: Quantity: 1 CPT4 Code: 76811572 Description: (Facility Use Only) 62035DH - APPLY MULTLAY COMPRS LWR RT LEG Modifier: 59 Quantity: 1 Physician Procedures : CPT4 Code Description Modifier [id number] - WC PHYS APPLY TOTAL CONTACT CAST ICD-10 Diagnosis Description L97.528 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ity Quantity: 1 Electronic Signature(s) Signed: 04/13/2020 5:04:19 PM By: [name] RN, BSN Signed: 04/16/2020 5:01:02 PM By: [name] MD Entered By: [name] on 04/13/2020 09:48:59

## 2020-04-16 NOTE — Progress Notes (Signed)
[name] ([id number]) Visit Report for 04/13/2020 Arrival Information Details Patient Name: Date of Service: [name] D 04/13/2020 8:15 A M Medical Record Number: [id number] Patient Account Number: [id number] Date of Birth/Sex: Treating RN: 1985/11/03 (35 y.o. [name], [name] Primary Care Yannick Steuber: [name] Other Clinician: Referring Zuhair Lariccia: Treating Kadejah Sandiford/Extender: [name] in Treatment: 24 Visit Information History Since Last Visit Added or deleted any medications: No Patient Arrived: Ambulatory Any new allergies or adverse reactions: No Arrival Time: 08:27 Had a fall or experienced change in No Accompanied By: self activities of daily living that may affect Transfer Assistance: None risk of falls: Patient Identification Verified: Yes Signs or symptoms of abuse/neglect since last visito No Secondary Verification Process Completed: Yes Hospitalized since last visit: No Patient Requires Transmission-Based Precautions: No Implantable device outside of the clinic excluding No Patient Has Alerts: No cellular tissue based products placed in the center since last visit: Has Dressing in Place as Prescribed: Yes Pain Present Now: No Electronic Signature(s) Signed: 04/14/2020 5:35:07 PM By: Rhae Hammock RN Entered By: Rhae Hammock on 04/13/2020 08:27:51 -------------------------------------------------------------------------------- Compression Therapy Details Patient Name: Date of Service: [name], [name] 04/13/2020 8:15 A M Medical Record Number: [id number] Patient Account Number: [id number] Date of Birth/Sex: Treating RN: 1986/01/17 (35 y.o. [name] Primary Care Maisen Klingler: [name] Other Clinician: Referring Tashayla Therien: Treating [name]/Extender: [name] in Treatment: 24 Compression Therapy Performed for Wound Assessment: Wound #4 Right,Lateral Foot Performed By:  Clinician [name], RN Compression Type: Four Layer Post Procedure Diagnosis Same as Pre-procedure Electronic Signature(s) Signed: 04/13/2020 5:04:19 PM By: [name] RN, BSN Entered By: [name] on 04/13/2020 09:08:52 -------------------------------------------------------------------------------- Encounter Discharge Information Details Patient Name: Date of Service: [name], [name] 04/13/2020 8:15 A M Medical Record Number: [id number] Patient Account Number: [id number] Date of Birth/Sex: Treating RN: Mar 07, 1986 (35 y.o. [name] Primary Care Jezebel Pollet: [name] Other Clinician: Referring [name]: Treating Emersen Mascari/Extender: [name] in Treatment: 24 Encounter Discharge Information Items Discharge Condition: Stable Ambulatory Status: Ambulatory Discharge Destination: Home Transportation: Private Auto Accompanied By: self Schedule Follow-up Appointment: Yes Clinical Summary of Care: Electronic Signature(s) Signed: 04/13/2020 5:17:21 PM By: Deon Pilling Entered By: Deon Pilling on 04/13/2020 10:10:22 -------------------------------------------------------------------------------- Lower Extremity Assessment Details Patient Name: Date of Service: [name] D 04/13/2020 8:15 A M Medical Record Number: [id number] Patient Account Number: [id number] Date of Birth/Sex: Treating RN: September 12, 1985 (35 y.o. [name] Primary Care Christabell Loseke: [name] Other Clinician: Referring Neytiri Asche: Treating [name]/Extender: [name] in Treatment: 24 Edema Assessment Assessed: [name]: Yes] [name]: Yes] Edema: [Left: Yes] [Right: Yes] Calf Left: Right: Point of Measurement: 48 cm From Medial Instep 54 cm 47 cm Ankle Left: Right: Point of Measurement: 10 cm From Medial Instep 298 cm 26.5 cm Vascular Assessment Pulses: Dorsalis Pedis Palpable: [Left:Yes] [Right:Yes] Posterior  Tibial Palpable: [Left:Yes] [Right:Yes] Electronic Signature(s) Signed: 04/14/2020 5:35:07 PM By: Rhae Hammock RN Entered By: Rhae Hammock on 04/13/2020 08:44:59 -------------------------------------------------------------------------------- Multi Wound Chart Details Patient Name: Date of Service: [name], [name] 04/13/2020 8:15 A M Medical Record Number: [id number] Patient Account Number: [id number] Date of Birth/Sex: Treating RN: 05-16-1985 (35 y.o. [name] Primary Care Jadira Nierman: [name] Other Clinician: Referring Huston Stonehocker: Treating [name]/Extender: [name] in Treatment: 24 Vital Signs Height(in): 77 Capillary Blood Glucose(mg/dl): 130 Weight(lbs): 280 Pulse(bpm): 87 Body Mass Index(BMI): 33 Blood Pressure(mmHg): 147/97 Temperature(F): 97.8 Respiratory  Rate(breaths/min): 17 Photos: [3:No Photos Left, Lateral Foot] [4:No Photos Right, Lateral Foot] [N/A:N/A N/A] Wound Location: [3:Trauma] [4:Gradually Appeared] [N/A:N/A] Wounding Event: [3:Diabetic Wound/Ulcer of the Lower] [4:Diabetic Wound/Ulcer of the Lower] [N/A:N/A] Primary Etiology: [3:Extremity Type II Diabetes] [4:Extremity Type II Diabetes] [N/A:N/A] Comorbid History: [3:10/02/2019] [4:02/06/2020] [N/A:N/A] Date Acquired: [3:24] [4:8] [N/A:N/A] Weeks of Treatment: [3:Open] [4:Open] [N/A:N/A] Wound Status: [3:3.1x3x1] [4:0.6x0.6x0.1] [N/A:N/A] Measurements L x W x D (cm) [3:7.304] [4:0.283] [N/A:N/A] A (cm) : rea [3:7.304] [4:0.028] [N/A:N/A] Volume (cm) : [3:-342.90%] [4:69.20%] [N/A:N/A] % Reduction in A rea: [3:-4326.70%] [4:69.60%] [N/A:N/A] % Reduction in Volume: [3:9] Starting Position 1 (o'clock): [3:11] Ending Position 1 (o'clock): [3:1] Maximum Distance 1 (cm): [3:Yes] [4:No] [N/A:N/A] Undermining: [3:Grade 2] [4:Grade 2] [N/A:N/A] Classification: [3:Large] [4:Medium] [N/A:N/A] Exudate A mount: [3:Serosanguineous] [4:Serosanguineous]  [N/A:N/A] Exudate Type: [3:red, brown] [4:red, brown] [N/A:N/A] Exudate Color: [3:Distinct, outline attached] [4:Distinct, outline attached] [N/A:N/A] Wound Margin: [3:Large (67-100%)] [4:Large (67-100%)] [N/A:N/A] Granulation A mount: [3:Red, Pink, Hyper-granulation] [4:Red, Pink] [N/A:N/A] Granulation Quality: [3:Small (1-33%)] [4:None Present (0%)] [N/A:N/A] Necrotic A mount: [3:Fat Layer (Subcutaneous Tissue): Yes Fat Layer (Subcutaneous Tissue): Yes N/A] Exposed Structures: [3:Fascia: No Tendon: No Muscle: No Joint: No Bone: No Small (1-33%)] [4:Fascia: No Tendon: No Muscle: No Joint: No Bone: No Medium (34-66%)] [N/A:N/A] Epithelialization: [3:T Contact Cast otal] [4:Compression Therapy] [N/A:N/A] Treatment Notes Electronic Signature(s) Signed: 04/13/2020 5:04:19 PM By: [name] RN, BSN Signed: 04/16/2020 5:01:02 PM By: [name] MD Entered By: [name] on 04/13/2020 09:10:49 -------------------------------------------------------------------------------- Multi-Disciplinary Care Plan Details Patient Name: Date of Service: [name], [name] 04/13/2020 8:15 A M Medical Record Number: [id number] Patient Account Number: [id number] Date of Birth/Sex: Treating RN: 1986-03-13 (35 y.o. [name] Primary Care Sanye Ledesma: [name] Other Clinician: Referring Pavneet Markwood: Treating Rojean Ige/Extender: [name] in Treatment: 24 Active Inactive Nutrition Nursing Diagnoses: Imbalanced nutrition Potential for alteratiion in Nutrition/Potential for imbalanced nutrition Goals: Patient/caregiver agrees to and verbalizes understanding of need to use nutritional supplements and/or vitamins as prescribed Date Initiated: 10/24/2019 Date Inactivated: 04/06/2020 Target Resolution Date: 04/03/2020 Goal Status: Met Patient/caregiver will maintain therapeutic glucose control Date Initiated: 10/24/2019 Target Resolution Date: 05/01/2020 Goal Status:  Active Interventions: Assess HgA1c results as ordered upon admission and as needed Assess patient nutrition upon admission and as needed per policy Provide education on elevated blood sugars and impact on wound healing Provide education on nutrition Treatment Activities: Education provided on Nutrition : 12/13/2019 Notes: Wound/Skin Impairment Nursing Diagnoses: Impaired tissue integrity Knowledge deficit related to ulceration/compromised skin integrity Goals: Patient/caregiver will verbalize understanding of skin care regimen Date Initiated: 10/24/2019 Target Resolution Date: 05/01/2020 Goal Status: Active Ulcer/skin breakdown will have a volume reduction of 30% by week 4 Date Initiated: 10/24/2019 Date Inactivated: 01/16/2020 Target Resolution Date: 01/10/2020 Unmet Reason: no change in Goal Status: Unmet measurements. Interventions: Assess patient/caregiver ability to obtain necessary supplies Assess patient/caregiver ability to perform ulcer/skin care regimen upon admission and as needed Assess ulceration(s) every visit Provide education on ulcer and skin care Notes: Electronic Signature(s) Signed: 04/13/2020 5:04:19 PM By: [name] RN, BSN Entered By: [name] on 04/13/2020 09:02:42 -------------------------------------------------------------------------------- Pain Assessment Details Patient Name: Date of Service: [name], [name] 04/13/2020 8:15 A M Medical Record Number: [id number] Patient Account Number: [id number] Date of Birth/Sex: Treating RN: May 16, 1985 (35 y.o. [name] Primary Care Conna Terada: [name] Other Clinician: Referring Doral Digangi: Treating Saher Davee/Extender: [name] in Treatment: 24 Active Problems Location of Pain Severity and Description of Pain Patient Has  Paino No Site Locations Rate the pain. Current Pain Level: 0 Pain Management and Medication Current Pain Management: Electronic  Signature(s) Signed: 04/14/2020 5:35:07 PM By: Rhae Hammock RN Entered By: Rhae Hammock on 04/13/2020 08:31:25 -------------------------------------------------------------------------------- Patient/Caregiver Education Details Patient Name: Date of Service: [name], [name] 1/10/2022andnbsp8:15 Regan Record Number: [id number] Patient Account Number: [id number] Date of Birth/Gender: Treating RN: 08/14/1985 (35 y.o. [name] Primary Care Physician: [name] Other Clinician: Referring Physician: Treating Physician/Extender: [name] in Treatment: 24 Education Assessment Education Provided To: Patient Education Topics Provided Wound/Skin Impairment: Methods: Explain/Verbal Responses: State content correctly [employer]) Signed: 04/13/2020 5:04:19 PM By: [name] RN, BSN Entered By: [name] on 04/13/2020 09:09:06 -------------------------------------------------------------------------------- Wound Assessment Details Patient Name: Date of Service: [name], [name] 04/13/2020 8:15 A M Medical Record Number: [id number] Patient Account Number: [id number] Date of Birth/Sex: Treating RN: 1985/12/29 (35 y.o. [name], [name] Primary Care Tomer Chalmers: [name] Other Clinician: Referring Jannessa Ogden: Treating Bethene Hankinson/Extender: [name] in Treatment: 24 Wound Status Wound Number: 3 Primary Etiology: Diabetic Wound/Ulcer of the Lower Extremity Wound Location: Left, Lateral Foot Wound Status: Open Wounding Event: Trauma Comorbid History: Type II Diabetes Date Acquired: 10/02/2019 Weeks Of Treatment: 24 Clustered Wound: No Photos Photo Uploaded By: [name] on 04/14/2020 13:24:50 Wound Measurements Length: (cm) 3.1 Width: (cm) 3 Depth: (cm) 1 Area: (cm) 7.304 Volume: (cm) 7.304 % Reduction in Area: -342.9% % Reduction in Volume:  -4326.7% Epithelialization: Small (1-33%) Tunneling: No Undermining: Yes Starting Position (o'clock): 9 Ending Position (o'clock): 11 Maximum Distance: (cm) 1 Wound Description Classification: Grade 2 Wound Margin: Distinct, outline attached Exudate Amount: Large Exudate Type: Serosanguineous Exudate Color: red, brown Foul Odor After Cleansing: No Slough/Fibrino Yes Wound Bed Granulation Amount: Large (67-100%) Exposed Structure Granulation Quality: Red, Pink, Hyper-granulation Fascia Exposed: No Necrotic Amount: Small (1-33%) Fat Layer (Subcutaneous Tissue) Exposed: Yes Necrotic Quality: Adherent Slough Tendon Exposed: No Muscle Exposed: No Joint Exposed: No Bone Exposed: No Treatment Notes Wound #3 (Foot) Wound Laterality: Left, Lateral Cleanser Peri-Wound Care Zinc Oxide Ointment 30g tube Discharge Instruction: Apply Zinc Oxide to periwound with each dressing change Sween Lotion (Moisturizing lotion) Discharge Instruction: Apply moisturizing lotion as directed Topical Primary Dressing KerraCel Ag Gelling Fiber Dressing, 4x5 in (silver alginate) Discharge Instruction: Apply silver alginate to wound bed as instructed Secondary Dressing ABD Pad, 5x9 Discharge Instruction: Apply over primary dressing as directed. Drawtex 4x4 in Discharge Instruction: Apply over primary dressing as directed. T Contact Cast otal Secured With Compression Wrap Compression Stockings Add-Ons Electronic Signature(s) Signed: 04/14/2020 5:35:07 PM By: Rhae Hammock RN Entered By: Rhae Hammock on 04/13/2020 08:50:20 -------------------------------------------------------------------------------- Wound Assessment Details Patient Name: Date of Service: [name], [name] 04/13/2020 8:15 A M Medical Record Number: [id number] Patient Account Number: [id number] Date of Birth/Sex: Treating RN: [birth year] (35 y.o. [name], [name] Primary Care Zenas Santa: [name] Other  Clinician: Referring Rowen Wilmer: Treating Jumana Paccione/Extender: [name] in Treatment: 24 Wound Status Wound Number: 4 Primary Etiology: Diabetic Wound/Ulcer of the Lower Extremity Wound Location: Right, Lateral Foot Wound Status: Open Wounding Event: Gradually Appeared Comorbid History: Type II Diabetes Date Acquired: 02/06/2020 Weeks Of Treatment: 8 Clustered Wound: No Photos Photo Uploaded By: [name] on 04/14/2020 13:24:51 Wound Measurements Length: (cm) 0.6 Width: (cm) 0.6 Depth: (cm) 0.1 Area: (cm) 0.283 Volume: (cm) 0.028 % Reduction in Area: 69.2% % Reduction in Volume: 69.6% Epithelialization: Medium (34-66%) Tunneling: No Undermining: No Wound  Description Classification: Grade 2 Wound Margin: Distinct, outline attached Exudate Amount: Medium Exudate Type: Serosanguineous Exudate Color: red, brown Foul Odor After Cleansing: No Slough/Fibrino No Wound Bed Granulation Amount: Large (67-100%) Exposed Structure Granulation Quality: Red, Pink Fascia Exposed: No Necrotic Amount: None Present (0%) Fat Layer (Subcutaneous Tissue) Exposed: Yes Tendon Exposed: No Muscle Exposed: No Joint Exposed: No Bone Exposed: No Electronic Signature(s) Signed: 04/14/2020 5:35:07 PM By: Rhae Hammock RN Entered By: Rhae Hammock on 04/13/2020 08:50:44 -------------------------------------------------------------------------------- Vitals Details Patient Name: Date of Service: [name], [name] 04/13/2020 8:15 A M Medical Record Number: [id number] Patient Account Number: [id number] Date of Birth/Sex: Treating RN: [birth year] (35 y.o. [name], [name] Primary Care Quincee Gittens: [name] Other Clinician: Referring Chelise Hanger: Treating Barrett Holthaus/Extender: [name] in Treatment: 24 Vital Signs Time Taken: 08:27 Temperature (F): 97.8 Height (in): 77 Pulse (bpm): 87 Weight (lbs): 280 Respiratory Rate  (breaths/min): 17 Body Mass Index (BMI): 33.2 Blood Pressure (mmHg): 147/97 Capillary Blood Glucose (mg/dl): 130 Reference Range: 80 - 120 mg / dl Electronic Signature(s) Signed: 04/14/2020 5:35:07 PM By: Rhae Hammock RN Entered By: Rhae Hammock on 04/13/2020 08:31:18

## 2020-04-20 ENCOUNTER — Encounter (HOSPITAL_BASED_OUTPATIENT_CLINIC_OR_DEPARTMENT_OTHER): Payer: BC Managed Care – PPO | Admitting: Internal Medicine

## 2020-04-23 ENCOUNTER — Other Ambulatory Visit: Payer: Self-pay

## 2020-04-23 ENCOUNTER — Encounter (HOSPITAL_BASED_OUTPATIENT_CLINIC_OR_DEPARTMENT_OTHER): Payer: BC Managed Care – PPO | Admitting: Internal Medicine

## 2020-04-23 DIAGNOSIS — E11621 Type 2 diabetes mellitus with foot ulcer: Secondary | ICD-10-CM | POA: Diagnosis not present

## 2020-04-23 NOTE — Progress Notes (Signed)
[name], Max ([id number]) Visit Report for 04/23/2020 HPI Details Patient Name: Date of Service: [name] D 04/23/2020 8:15 A M Medical Record Number: [id number] Patient Account Number: [id number] Date of Birth/Sex: Treating RN: 27-Apr-1985 (35 y.o. 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 He comes in for review of a wound over the plantar fifth metatarsal head extending into the lateral part of the foot. He was followed for this previously by his podiatrist Dr. Cornelius Moras. As the patient tells his story he went to see podiatry first for a swelling he developed on the lateral part of his fifth metatarsal head in May. He states this was "open" by podiatry and the area closed. He was followed up in June and it was again opened callus removed and it closed promptly. There were plans being made for surgery on the fifth metatarsal head in June however his blood sugar was apparently too high for anesthesia. Apparently the area was debrided and opened again in June and it is never closed since. Looking over the records from podiatry I am really not able to follow this. It was clear when he was first seen it was before 5/14 at that point he already had a wound. By 5/17 the ulcer was resolved. I do not see anything about a procedure. On 5/28 noted to have pre-ulcerative moderate keratosis. X-ray noted 1/5 contracted toe and tailor's bunion and metatarsal deformity. On a visit date on 09/28/2018 the dorsal part of the left foot it healed and resolved. There was concern about swelling in his lower extremity he was sent to the ER.. As far as I can tell he was seen in the ER on 7/12 with an ulcer on his left foot. A DVT rule out  of the left leg was negative. I do not think I have complete records from podiatry but I am not able to verify the procedures this patient states he had. He states after the last procedure the wound has never closed although I am not able to follow this in the records I have from podiatry. He has not had a recent x-ray The patient has been using Neosporin on the wound. He is wearing a Darco shoe. He is still very active up on his foot working and exercising. Past medical history; type 2 diabetes ketosis-prone, leg swelling with a negative DVT study in July. Non-smoker ABI in our clinic was 0.85 on the left 10/16; substantial wound on the plantar left fifth met head extending laterally almost to the dorsal fifth MTP. We have been using silver alginate we gave him a Darco forefoot off loader. An x-ray did not show evidence of osteomyelitis did note soft tissue emphysema which I think was due to gas tracking through an open wound. There is no doubt in my mind he requires an MRI 10/23; MRI not booked until 3 November at the earliest this is largely due to his glucose sensor in the right arm. We have been using silver alginate. There has been an improvement 10/29; I am still not exactly sure when his MRI is booked for. He says it is the third but it is the 10th in epic. This definitely needs to be done. He is running a low-grade fever today but no other symptoms. No real  improvement in the 1 02/26/2019 patient presents today for a follow-up visit here in our clinic he is last been seen in the clinic on October 29. Subsequently we were working on getting MRI to evaluate and see what exactly was going on and where we would need to go from the standpoint of whether or not he had osteomyelitis and again what treatments were going be required. Subsequently the patient ended up being admitted to the hospital on 02/07/2019 and was discharged on 02/14/2019. This is a somewhat interesting admission with a discharge  diagnosis of pneumonia due to COVID-19 although he was positive for COVID-19 when tested at the urgent care but negative x2 when he was actually in the hospital. With that being said he did have acute respiratory failure with hypoxia and it was noted he also have a left foot ulceration with osteomyelitis. With that being said he did require oxygen for his pneumonia and I level 4 L. He was placed on antivirals and steroids for the COVID-19. He was also transferred to the [place] at one point. Nonetheless he did subsequently discharged home and since being home has done much better in that regard. The CT angiogram did not show any pulmonary embolism. With regard to the osteomyelitis the patient was placed on vancomycin and Zosyn while in the hospital but has been changed to Augmentin at discharge. It was also recommended that he follow- up with wound care and podiatry. Podiatry however wanted him to see [name] according to the patient prior to them doing anything further. His hemoglobin A1c was 9.9 as noted in the hospital. Have an MRI of the left foot performed while in the hospital on 02/04/2019. 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 There is an overlying plantar open wound noted an abscess tracking back along the lateral aspect of the fifth metatarsal shaft. There is otherwise diffuse cellulitis and mild fasciitis without findings of polymyositis. The patient did have recently pneumonia secondary to COVID-19 I looked in the chart through epic and it does appear that the patient may need to have an additional x-ray just to ensure everything is cleared and that he has no airspace disease prior to putting him into the [name]. 03/05/2019; patient was readmitted to the clinic last week. He was hospitalized twice for a viral upper respiratory tract infection from 11/1 through 11/4 and then 11/5  through 11/12 ultimately this turned out to be Covid pneumonitis. Although he was discharged on oxygen he is not using it. He says he feels fine. He has no exercise limitation no cough no sputum. His O2 sat in our clinic today was 100% on room air. 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 He received Vanco and Zosyn in the hospital and then was discharged on 2 weeks of Augmentin. I do not see any relevant cultures. He was supposed to follow-up with infectious disease but I do not see that he has an appointment. 12/8; patient saw Dr. Novella Olive of infectious disease last week. He felt that he had had adequate antibiotic therapy. He did not go to follow-up with Dr. Amalia Hailey of podiatry and I have again talked to him about the pros and cons of this. He does not want to consider a ray amputation of this time. He is aware of the risks  of recurrence, migration etc. He started HBO today and tolerated this well. He can complete the Augmentin that I gave him last week. I have looked over the lab work that Dr. Chana Bode ordered his C-reactive protein was 3.3 and his sedimentation rate was 17. The C-reactive protein is never really been measurably that high in this patient 12/15; not much change in the wound today however he has undermining along the lateral part of the foot again more extensively than last week. He has some rims of epithelialization. We have been using silver alginate. He is undergoing hyperbarics but did not dive today 12/18; in for his obligatory first total contact cast change. Unfortunately there was pus coming from the undermining area around his fifth metatarsal head. This was cultured but will preclude reapplication of a cast. He is seen in conjunction with HBO 12/24; patient had staph lugdunensis in the wound in the undermining area laterally last time. We put him on doxycycline  which should have covered this. The wound looks better today. I am going to give him another week of doxycycline before reattempting the total contact cast 12/31; the patient is completing antibiotics. Hemorrhagic debris in the distal part of the wound with some undermining distally. He also had hyper granulation. Extensive debridement with a #5 curette. The infected area that was on the lateral part of the fifth met head is closed over. I do not think he needs any more antibiotics. Patient was seen prior to HBO. Preparations for a total contact cast were made in the cast will be placed post hyperbarics 04/11/19; once again the patient arrives today without complaint. He had been in a cast all week noted that he had heavy drainage this week. This resulted in large raised areas of macerated tissue around the wound 1/14; wound bed looks better slightly smaller. Hydrofera Blue has been changing himself. He had a heavy drainage last week which caused a lot of maceration around the wound so I took him out of a total contact cast he says the drainage is actually better this week He is seen today in conjunction with HBO 1/21; returns to clinic. He was up in [place] for a day or 2 attending a funeral. He comes back in with the wound larger and with a large area of exposed bone. He had osteomyelitis and septic arthritis of the fifth left metatarsal head while he was in hospital. He received IV antibiotics in the hospital for a prolonged period of time then 3 weeks of Augmentin. Subsequently I gave him 2 weeks of doxycycline for more superficial wound infection. When I saw this last week the wound was smaller the surface of the wound looks satisfactory. 1/28; patient missed hyperbarics today. Bone biopsy I did last time showed Enterococcus faecalis and Staphylococcus lugdunensis . He has a wide area of exposed bone. We are going to use silver alginate as of today. I had another ethical discussion with the  patient. 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 In this situation I think the likelihood of healing this is low. Therefore I have recommended a ray amputation and with the patient's agreement I have referred him to Dr. Doran Durand. The other issue is that his compliance with hyperbarics has been minimal because of his work schedule and given his underlying decision I am going to stop this today READMISSION 10/24/2019 MRI 09/29/2019 left foot IMPRESSION: 1. Apparent skin ulceration inferior and lateral to the 5th metatarsal base with underlying heterogeneous T2  signal and enhancement in the subcutaneous fat. Small peripherally enhancing fluid collections along the plantar and lateral aspects of the 5th metatarsal base suspicious for abscesses. 2. Interval amputation through the mid 5th metatarsal with nonspecific low-level marrow edema and enhancement. Given the proximity to the adjacent soft tissue inflammatory changes, osteomyelitis cannot be excluded. 3. The additional bones appear unremarkable. MRI 09/29/2019 right foot IMPRESSION: 1. Soft tissue ulceration lateral to the 5th MTP joint. There is low-level T2 hyperintensity within the 4th and 5th metatarsal heads and adjacent proximal phalanges without abnormal T1 signal or cortical destruction. These findings are nonspecific and could be seen with early marrow edema, hyperemia or early osteomyelitis. No evidence of septic joint. 2. Mild tenosynovitis and synovial enhancement associated with the extensor digitorum tendons at the level of the midfoot. 3. Diffuse low-level muscular T2 hyperintensity and enhancement, most consistent with diabetic myopathy. LEFT FOOT BONE Methicillin resistant staphylococcus aureus Staphylococcus lugdunensis MIC MIC CIPROFLOXACIN >=8 RESISTANT Resistant <=0.5 SENSI... Sensitive CLINDAMYCIN <=0.25 SENS... Sensitive >=8 RESISTANT Resistant ERYTHROMYCIN >=8 RESISTANT  Resistant >=8 RESISTANT Resistant GENTAMICIN <=0.5 SENSI... Sensitive <=0.5 SENSI... Sensitive Inducible Clindamycin NEGATIVE Sensitive NEGATIVE Sensitive OXACILLIN >=4 RESISTANT Resistant 2 SENSITIVE Sensitive RIFAMPIN <=0.5 SENSI... Sensitive <=0.5 SENSI... Sensitive TETRACYCLINE <=1 SENSITIVE Sensitive <=1 SENSITIVE Sensitive TRIMETH/SULFA <=10 SENSIT Sensitive <=10 SENSIT Sensitive ... Marland Kitchen.. VANCOMYCIN 1 SENSITIVE Sensitive <=0.5 SENSI... Sensitive Right foot bone . Component 3 wk ago Specimen Description BONE Special Requests RIGHT 4 METATARSAL SAMPLE B Gram Stain NO WBC SEEN NO ORGANISMS SEEN Culture RARE METHICILLIN RESISTANT STAPHYLOCOCCUS AUREUS NO ANAEROBES ISOLATED Performed at [facility], [place] [address], [place], [place] [address] Report Status 10/08/2019 FINAL Organism ID, Bacteria METHICILLIN RESISTANT STAPHYLOCOCCUS AUREUS Resulting Agency [facility] Susceptibility Methicillin resistant staphylococcus aureus MIC CIPROFLOXACIN >=8 RESISTANT Resistant CLINDAMYCIN <=0.25 SENS... Sensitive ERYTHROMYCIN >=8 RESISTANT Resistant GENTAMICIN <=0.5 SENSI... Sensitive Inducible Clindamycin NEGATIVE Sensitive OXACILLIN >=4 RESISTANT Resistant RIFAMPIN <=0.5 SENSI... Sensitive TETRACYCLINE <=1 SENSITIVE Sensitive TRIMETH/SULFA <=10 SENSIT Sensitive ... VANCOMYCIN 1 SENSITIVE Sensitive This is a patient we had in clinic earlier this year with a wound over his left fifth metatarsal head. 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 Doran Durand on 05/16/2019. He seems to have developed problems with wounds on his bilateral feet in June 2021 although he may have had problems earlier than this. He was  in an urgent care with a right foot ulcer on 09/26/2019 and given a course of doxycycline. This was apparently after having trouble getting into see orthopedics. He was seen by podiatry on 09/28/2019 noted to have bilateral lower extremity ulcers including the left lateral fifth metatarsal base and the right subfifth met head. It was noted that had purulent drainage at that time. He required hospitalization from 6/20 through 7/2. This was because of worsening right foot wounds. He underwent bilateral operative incision and drainage and bone biopsies bilaterally. Culture results are listed above. He has been referred back to clinic by Dr. Jacqualyn Posey of podiatry. He is also followed by Dr. Megan Salon who saw him yesterday. He was discharged from hospital on Zyvox Flagyl and Levaquin and yesterday changed to doxycycline Flagyl and Levaquin. His inflammatory markers on 6/26 showed a sedimentation rate of 129 and a C-reactive protein of 5. This  is improved to 14 and 1.3 respectively. This would indicate improvement. ABIs in our clinic today were 1.23 on the right and 1.20 on the left 11/01/2019 on evaluation today patient appears to be doing fairly well in regard to the wounds on his feet at this point. Fortunately there is no signs of active infection at this time. No fevers, chills, nausea, vomiting, or diarrhea. He currently is seeing infectious disease and still under their care at this point. Subsequently he also has both wounds which she has not been using collagen on as he did not receive that in his packaging he did not call us and let us know that. Apparently that just was missed on the order. Nonetheless we will get that straightened out today. 8/9-Patient returns for bilateral foot wounds, using Prisma with hydrogel moistened dressings, and the wounds appear stable. Patient using surgical shoes, avoiding much pressure or weightbearing as much as possible 8/16; patient has bilateral foot wounds. 1 on the  right lateral foot proximally the other is on the left mid lateral foot. Both required debridement of callus and thick skin around the wounds. We have been using silver collagen 8/27; patient has bilateral lateral foot wounds. The area on the left substantially surrounded by callus and dry skin. This was removed from the wound edge. The underlying wound is small. The area on the right measured somewhat smaller today. 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 Unfortunately I did not update his antibiotics during today's visit. 9/10 I reviewed Dr. Hale Bogus last notes he felt he had completed antibiotics his inflammatory markers were reasonably well controlled. He has a small wound on the lateral left foot and a tiny area on the right which is just above closed. He is using Hydrofera Blue with border foam he has bilateral surgical shoes 9/24; 2 week f/u. doing well. right foot is closed. left foot still undermined. 10/14; right foot remains closed at the fifth met head. The area over the base of the left fifth metatarsal has a small open area but considerable undermining towards the plantar foot. Thick callus skin around this suggests an adequate pressure relief. We have talked about this. He says he is going to go back into his cam boot. I suggested a total contact cast he did not seem enamored with this suggestion 10/26; left foot base of the fifth metatarsal. Same condition as last time. He has skin over the area with an open wound however the skin is not adherent. He went to see Dr. Earleen Newport who did an x-ray and culture of his foot I have not reviewed the x-ray but the patient was not told anything. He is on doxycycline 11/11; since the patient was last here he was in the emergency room on 10/30 he was concerned about swelling in the left foot. They did not do any cultures or x-rays. They changed his antibiotics to cephalexin.  Previous culture showed group B strep. The cephalexin is appropriate as doxycycline has less than predictable coverage. Arrives in clinic today with swelling over this area under the wound. He also has a new wound on the right fifth metatarsal head 11/18; the patient has a difficult wound on the lateral aspect of the left fifth metatarsal head. The wound was almost ballotable last week I opened it slightly expecting to see purulence however there was just bleeding. I cultured this this was negative. X-ray unchanged. We are trying to get an  MRI but I am not sure were going to be able to get this through his insurance. He also has an area on the right lateral fifth metatarsal head this looks healthier 12/3; the patient finally got our MRI. Surprisingly this did not show osteomyelitis. I did show the soft tissue ulceration at the lateral plantar aspect of the fifth metatarsal base with a tiny residual 6 mm abscess overlying the superficial fascia I have tried to culture this area I have not been able to get this to grow anything. Nevertheless the protruding tissue looks aggravated. I suspect we should try to treat the underlying "abscess with broad-spectrum antibiotics. I am going to start him on Levaquin and Flagyl. He has much less edema in his legs and I am going to continue to wrap his legs and see him weekly 12/10. I started Levaquin and Flagyl on him last week. He just picked up the Flagyl apparently there was some delay. The worry is the wound on the left fifth metatarsal base which is substantial and worsening. His foot looks like he inverts at the ankle making this a weightbearing surface. Certainly no improvement in fact I think the measurements of this are somewhat worse. We have been using 12/17; he apparently just got the Levaquin yesterday this is 2 weeks after the fact. He has completed the Flagyl. The area over the left fifth metatarsal base still has protruding granulation tissue although  it does not look quite as bad as it did some weeks ago. He has severe bilateral lymphedema although we have not been treating him for wounds on his legs this is definitely going to require compression. There was so much edema in the left I did not wish to put him in a total contact cast today. I am going to increase his compression from 3-4 layer. The area on the right lateral fifth met head actually look quite good and superficial. 12/23; patient arrived with callus on the right fifth met head and the substantial hyper granulated callused wound on the base of his fifth metatarsal. He says he is completing his Levaquin in 2 days but I do not think that adds up with what I gave him but I will have to double check this. We are using Hydrofera Blue on both areas. My plan is to put the left leg in a cast the week after New Year's 04/06/2020; patient's wounds about the same. Right lateral fifth metatarsal head and left lateral foot over the base of the fifth metatarsal. There is undermining on the left lateral foot which I removed before application of total contact cast continuing with Hydrofera Blue new. Patient tells me he was seen by endocrinology today lab work was done [Dr. Kerr]. Also wondering whether he was referred to cardiology. I went over some lab work from previously does not have chronic renal failure certainly not nephrotic range proteinuria he does have very poorly controlled diabetes but this is not his most updated lab work. Hemoglobin A1c has been over 11 1/10; the patient had a considerable amount of leakage towards mid part of his left foot with macerated skin however the wound surface looks better the area on the right lateral fifth met head is better as well. I am going to change the dressing on the left foot under the total contact cast to silver alginate, continue with Hydrofera Blue on the right. 1/20; patient was in the total contact cast for 10 days. Considerable amount of  drainage although the skin  around the wound does not look too bad on the left foot. The area on the right fifth metatarsal head is closed. Our nursing staff reports large amount of drainage out of the left lateral foot wound Electronic Signature(s) Signed: 04/23/2020 4:36:20 PM By: [name] MD Entered By: [name] on 04/23/2020 10:06:52 -------------------------------------------------------------------------------- Physical Exam Details Patient Name: Date of Service: [name], [name] D 04/23/2020 8:15 A M Medical Record Number: [id number] Patient Account Number: [id number] Date of Birth/Sex: Treating RN: 11/22/1985 (35 y.o. [name] Primary Care Provider: [name] Other Clinician: Referring Provider: Treating Provider/Extender: [name] in Treatment: 26 Constitutional Sitting or standing Blood Pressure is within target range for patient.. Pulse regular and within target range for patient.Marland Kitchen Respirations regular, non-labored and within target range.. Temperature is normal and within the target range for the patient.Marland Kitchen Appears in no distress. Notes Wound exam; left lateral fifth metatarsal base. Substantial wound although somewhat better. There are macerated edges here but I did not debride this today. It does not look like there is significant soft tissue infection. T rule out significant biofilm/bioburden I removed a small piece of tissue for PCR culture. o The area on the right fifth metatarsal head is closed Electronic Signature(s) Signed: 04/23/2020 4:36:20 PM By: [name] MD Entered By: [name] on 04/23/2020 10:08:34 -------------------------------------------------------------------------------- Physician Orders Details Patient Name: Date of Service: [name], [name] D 04/23/2020 8:15 A M Medical Record Number: [id number] Patient Account Number: [id number] Date of Birth/Sex: Treating RN: Jul 16, 1985 (35 y.o. [name] Primary Care Provider: [name] Other Clinician: Referring Provider: Treating Provider/Extender: [name] in Treatment: 26 Verbal / Phone Orders: No Diagnosis Coding ICD-10 Coding Code Description E11.621 Type 2 diabetes mellitus with foot ulcer L97.528 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ity L97.518 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ity Follow-up Appointments Return Appointment in 1 week. Bathing/ Shower/ Hygiene May shower with protection but do not get wound dressing(s) wet. - may use cast protectors Edema Control - Lymphedema / SCD / Other Bilateral Lower Extremities Elevate legs to the level of the heart or above for 30 minutes daily and/or when sitting, a frequency of: - throughout the day Avoid standing for long periods of time. Exercise regularly Moisturize legs daily. - right leg every night before bed. Compression stocking or Garment 20-30 mm/Hg pressure to: - Patient to purchase compression stockings. apply to right leg in the morning and remove at night. Off-Loading Total Contact Cast to Left Lower Extremity Other: - ***ENSURE TO PAD THE RIGHT LATERAL APSECT OF THE FOOT*** Wound Treatment Wound #3 - Foot Wound Laterality: Left, Lateral Peri-Wound Care: Zinc Oxide Ointment 30g tube 1 x Per Week Discharge Instructions: Apply Zinc Oxide to periwound with each dressing change Peri-Wound Care: Sween Lotion (Moisturizing lotion) 1 x Per Week Discharge Instructions: Apply moisturizing lotion as directed Prim Dressing: KerraCel Ag Gelling Fiber Dressing, 4x5 in (silver alginate) 1 x Per Week ary Discharge Instructions: Apply silver alginate to wound bed as instructed Secondary Dressing: ABD Pad, 5x9 1 x Per Week Discharge Instructions: Apply over primary dressing as directed. Secondary Dressing: Zetuvit Plus 4x8 in 1 x Per  Week Discharge Instructions: Apply over primary dressing as directed. Secondary Dressing: Drawtex 4x4 in 1 x Per Week Discharge Instructions: Apply over primary dressing as directed. Secondary Dressing: T Contact Cast otal 1 x Per Week Secured With:  Kerlix Roll Sterile, 4.5x3.1 (in/yd) 1 x Per Week Discharge Instructions: Secure with Kerlix as directed. Laboratory naerobe culture (MICRO) - deep tissue culture of left foot wound. Bacteria identified in Unspecified specimen by A LOINC Code: 811-9 Convenience Name: Anerobic culture Electronic Signature(s) Signed: 04/23/2020 4:36:20 PM By: [name] MD Signed: 04/23/2020 4:56:08 PM By: Deon Pilling Entered By: Deon Pilling on 04/23/2020 09:23:01 -------------------------------------------------------------------------------- Problem List Details Patient Name: Date of Service: [name], [name] D 04/23/2020 8:15 A M Medical Record Number: [id number] Patient Account Number: [id number] Date of Birth/Sex: Treating RN: 10/30/1985 (35 y.o. 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, right ankle and foot 10/24/2019 10/24/2019 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, left ankle and foot 10/24/2019 10/24/2019 B95.62 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 10/24/2019 10/24/2019 classified  elsewhere Resolved Problems Electronic Signature(s) Signed: 04/23/2020 4:36:20 PM By: [name] MD Entered By: [name] on 04/23/2020 10:05:12 -------------------------------------------------------------------------------- Progress Note Details Patient Name: Date of Service: [name], [name] D 04/23/2020 8:15 A M Medical Record Number: [id number] Patient Account Number: [id number] Date of Birth/Sex: Treating RN: 10-27-1985 (35 y.o. 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 He comes in for review of a wound over the plantar fifth metatarsal head extending into the lateral part of the foot. He was followed for this previously by his podiatrist Dr. Cornelius Moras. As the patient tells his story he went to see podiatry first for a swelling he developed on the lateral part of his fifth metatarsal head in May. He states this was "open" by podiatry and the area closed. He was followed up in June and it was again opened callus removed and it closed promptly. There were plans being made for surgery on the fifth metatarsal head in June however his blood sugar was apparently too high for anesthesia. Apparently the area was debrided and opened again in June and it is never closed since. Looking over the records from podiatry I am really not able to follow this. It was clear when he was first seen it was before 5/14 at that point he already had a wound. By 5/17 the ulcer was resolved. I do not see anything about a procedure. On 5/28 noted to have pre-ulcerative moderate keratosis. X-ray noted 1/5 contracted toe and tailor's bunion and metatarsal deformity. On a visit date on 09/28/2018 the dorsal part of the left foot it  healed and resolved. There was concern about swelling in his lower extremity he was sent to the ER.. As far as I can tell he was seen in the ER on 7/12 with an ulcer on his left foot. A DVT rule out of the left leg was negative. I do not think I have complete records from podiatry but I am not able to verify the procedures  this patient states he had. He states after the last procedure the wound has never closed although I am not able to follow this in the records I have from podiatry. He has not had a recent x-ray The patient has been using Neosporin on the wound. He is wearing a Darco shoe. He is still very active up on his foot working and exercising. Past medical history; type 2 diabetes ketosis-prone, leg swelling with a negative DVT study in July. Non-smoker ABI in our clinic was 0.85 on the left 10/16; substantial wound on the plantar left fifth met head extending laterally almost to the dorsal fifth MTP. We have been using silver alginate we gave him a Darco forefoot off loader. An x-ray did not show evidence of osteomyelitis did note soft tissue emphysema which I think was due to gas tracking through an open wound. There is no doubt in my mind he requires an MRI 10/23; MRI not booked until 3 November at the earliest this is largely due to his glucose sensor in the right arm. We have been using silver alginate. There has been an improvement 10/29; I am still not exactly sure when his MRI is booked for. He says it is the third but it is the 10th in epic. This definitely needs to be done. He is running a low-grade fever today but no other symptoms. No real improvement in the 1 02/26/2019 patient presents today for a follow-up visit here in our clinic he is last been seen in the clinic on October 29. Subsequently we were working on getting MRI to evaluate and see what exactly was going on and where we would need to go from the standpoint of whether or not he had osteomyelitis and again what  treatments were going be required. Subsequently the patient ended up being admitted to the hospital on 02/07/2019 and was discharged on 02/14/2019. This is a somewhat interesting admission with a discharge diagnosis of pneumonia due to COVID-19 although he was positive for COVID-19 when tested at the urgent care but negative x2 when he was actually in the hospital. With that being said he did have acute respiratory failure with hypoxia and it was noted he also have a left foot ulceration with osteomyelitis. With that being said he did require oxygen for his pneumonia and I level 4 L. He was placed on antivirals and steroids for the COVID-19. He was also transferred to the [facility] at one point. Nonetheless he did subsequently discharged home and since being home has done much better in that regard. The CT angiogram did not show any pulmonary embolism. With regard to the osteomyelitis the patient was placed on vancomycin and Zosyn while in the hospital but has been changed to Augmentin at discharge. It was also recommended that he follow- up with wound care and podiatry. Podiatry however wanted him to see [name] according to the patient prior to them doing anything further. His hemoglobin A1c was 9.9 as noted in the hospital. Have an MRI of the left foot performed while in the hospital on 02/04/2019. 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 There is an overlying plantar open wound noted an abscess tracking back along the lateral aspect of the fifth metatarsal shaft. There is otherwise diffuse cellulitis and mild fasciitis without findings of polymyositis. The patient did have recently pneumonia secondary to COVID-19 I looked in the chart through epic and it  does appear that the patient may need to have an additional x-ray just to ensure everything is cleared and that he has no airspace disease prior to  putting him into the [name]. 03/05/2019; patient was readmitted to the clinic last week. He was hospitalized twice for a viral upper respiratory tract infection from 11/1 through 11/4 and then 11/5 through 11/12 ultimately this turned out to be Covid pneumonitis. Although he was discharged on oxygen he is not using it. He says he feels fine. He has no exercise limitation no cough no sputum. His O2 sat in our clinic today was 100% on room air. 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 He received Vanco and Zosyn in the hospital and then was discharged on 2 weeks of Augmentin. I do not see any relevant cultures. He was supposed to follow-up with infectious disease but I do not see that he has an appointment. 12/8; patient saw Dr. Novella Olive of infectious disease last week. He felt that he had had adequate antibiotic therapy. He did not go to follow-up with Dr. Amalia Hailey of podiatry and I have again talked to him about the pros and cons of this. He does not want to consider a ray amputation of this time. He is aware of the risks of recurrence, migration etc. He started HBO today and tolerated this well. He can complete the Augmentin that I gave him last week. I have looked over the lab work that Dr. Chana Bode ordered his C-reactive protein was 3.3 and his sedimentation rate was 17. The C-reactive protein is never really been measurably that high in this patient 12/15; not much change in the wound today however he has undermining along the lateral part of the foot again more extensively than last week. He has some rims of epithelialization. We have been using silver alginate. He is undergoing hyperbarics but did not dive today 12/18; in for his obligatory first total contact cast change. Unfortunately there was pus coming from the undermining area around his fifth metatarsal head. This was cultured  but will preclude reapplication of a cast. He is seen in conjunction with HBO 12/24; patient had staph lugdunensis in the wound in the undermining area laterally last time. We put him on doxycycline which should have covered this. The wound looks better today. I am going to give him another week of doxycycline before reattempting the total contact cast 12/31; the patient is completing antibiotics. Hemorrhagic debris in the distal part of the wound with some undermining distally. He also had hyper granulation. Extensive debridement with a #5 curette. The infected area that was on the lateral part of the fifth met head is closed over. I do not think he needs any more antibiotics. Patient was seen prior to HBO. Preparations for a total contact cast were made in the cast will be placed post hyperbarics 04/11/19; once again the patient arrives today without complaint. He had been in a cast all week noted that he had heavy drainage this week. This resulted in large raised areas of macerated tissue around the wound 1/14; wound bed looks better slightly smaller. Hydrofera Blue has been changing himself. He had a heavy drainage last week which caused a lot of maceration around the wound so I took him out of a total contact cast he says the drainage is actually better this week He is seen today in conjunction with HBO  1/21; returns to clinic. He was up in [place] for a day or 2 attending a funeral. He comes back in with the wound larger and with a large area of exposed bone. He had osteomyelitis and septic arthritis of the fifth left metatarsal head while he was in hospital. He received IV antibiotics in the hospital for a prolonged period of time then 3 weeks of Augmentin. Subsequently I gave him 2 weeks of doxycycline for more superficial wound infection. When I saw this last week the wound was smaller the surface of the wound looks satisfactory. 1/28; patient missed hyperbarics today. Bone biopsy I did last  time showed Enterococcus faecalis and Staphylococcus lugdunensis . He has a wide area of exposed bone. We are going to use silver alginate as of today. I had another ethical discussion with the patient. 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 In this situation I think the likelihood of healing this is low. Therefore I have recommended a ray amputation and with the patient's agreement I have referred him to Dr. Doran Durand. The other issue is that his compliance with hyperbarics has been minimal because of his work schedule and given his underlying decision I am going to stop this today READMISSION 10/24/2019 MRI 09/29/2019 left foot IMPRESSION: 1. Apparent skin ulceration inferior and lateral to the 5th metatarsal base with underlying heterogeneous T2 signal and enhancement in the subcutaneous fat. Small peripherally enhancing fluid collections along the plantar and lateral aspects of the 5th metatarsal base suspicious for abscesses. 2. Interval amputation through the mid 5th metatarsal with nonspecific low-level marrow edema and enhancement. Given the proximity to the adjacent soft tissue inflammatory changes, osteomyelitis cannot be excluded. 3. The additional bones appear unremarkable. MRI 09/29/2019 right foot IMPRESSION: 1. Soft tissue ulceration lateral to the 5th MTP joint. There is low-level T2 hyperintensity within the 4th and 5th metatarsal heads and adjacent proximal phalanges without abnormal T1 signal or cortical destruction. These findings are nonspecific and could be seen with early marrow edema, hyperemia or early osteomyelitis. No evidence of septic joint. 2. Mild tenosynovitis and synovial enhancement associated with the extensor digitorum tendons at the level of the midfoot. 3. Diffuse low-level muscular T2 hyperintensity and enhancement, most consistent with diabetic myopathy. LEFT FOOT BONE Methicillin resistant staphylococcus aureus  Staphylococcus lugdunensis MIC MIC CIPROFLOXACIN >=8 RESISTANT Resistant <=0.5 SENSI... Sensitive CLINDAMYCIN <=0.25 SENS... Sensitive >=8 RESISTANT Resistant ERYTHROMYCIN >=8 RESISTANT Resistant >=8 RESISTANT Resistant GENTAMICIN <=0.5 SENSI... Sensitive <=0.5 SENSI... Sensitive Inducible Clindamycin NEGATIVE Sensitive NEGATIVE Sensitive OXACILLIN >=4 RESISTANT Resistant 2 SENSITIVE Sensitive RIFAMPIN <=0.5 SENSI... Sensitive <=0.5 SENSI... Sensitive TETRACYCLINE <=1 SENSITIVE Sensitive <=1 SENSITIVE Sensitive TRIMETH/SULFA <=10 SENSIT Sensitive <=10 SENSIT Sensitive ... Marland Kitchen.. VANCOMYCIN 1 SENSITIVE Sensitive <=0.5 SENSI... Sensitive Right foot bone . Component 3 wk ago Specimen Description BONE Special Requests RIGHT 4 METATARSAL SAMPLE B Gram Stain NO WBC SEEN NO ORGANISMS SEEN Culture RARE METHICILLIN RESISTANT STAPHYLOCOCCUS AUREUS NO ANAEROBES ISOLATED Performed at [facility], [place] [address], [place], [place] [address] Report Status 10/08/2019 FINAL Organism ID, Bacteria METHICILLIN RESISTANT STAPHYLOCOCCUS AUREUS Resulting Agency [facility] Susceptibility Methicillin resistant staphylococcus aureus MIC CIPROFLOXACIN >=8 RESISTANT Resistant CLINDAMYCIN <=0.25 SENS... Sensitive ERYTHROMYCIN >=8 RESISTANT Resistant GENTAMICIN <=0.5 SENSI... Sensitive Inducible Clindamycin NEGATIVE Sensitive OXACILLIN >=4 RESISTANT Resistant RIFAMPIN <=0.5 SENSI... Sensitive TETRACYCLINE <=1 SENSITIVE Sensitive TRIMETH/SULFA <=10 SENSIT Sensitive ... VANCOMYCIN 1 SENSITIVE Sensitive This is a patient we had in clinic earlier this year with a wound over his left fifth  metatarsal head. 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 Doran Durand on 05/16/2019. He seems to have developed problems with wounds on his bilateral feet in June 2021 although he may have had problems earlier than this. He was in an urgent care with a right foot ulcer on 09/26/2019 and given a course of doxycycline. This was apparently after having trouble getting into see orthopedics. He was seen by podiatry on 09/28/2019 noted to have bilateral lower extremity ulcers including the left lateral fifth metatarsal base and the right subfifth met head. It was noted that had purulent drainage at that time. He required hospitalization from 6/20 through 7/2. This was because of worsening right foot wounds. He underwent bilateral operative incision and drainage and bone biopsies bilaterally. Culture results are listed above. He has been referred back to clinic by Dr. Jacqualyn Posey of podiatry. He is also followed by Dr. Megan Salon who saw him yesterday. He was discharged from hospital on Zyvox Flagyl and Levaquin and yesterday changed to doxycycline Flagyl and Levaquin. His inflammatory markers on 6/26 showed a sedimentation rate of 129 and a C-reactive protein of 5. This is improved to 14 and 1.3 respectively. This would indicate improvement. ABIs in our clinic today were 1.23 on the right and 1.20 on the left 11/01/2019 on evaluation today patient appears to be doing fairly well in regard to the wounds on his feet at this point. Fortunately there is no signs of active infection at this time. No fevers, chills, nausea, vomiting, or diarrhea. He currently is seeing infectious disease and still under their care at this point. Subsequently he also has both wounds which she has not been using collagen on as he did not receive that in his packaging he did not call us and let us know that. Apparently that just was missed on the order. Nonetheless we will get that straightened out today. 8/9-Patient returns for bilateral foot wounds, using Prisma with  hydrogel moistened dressings, and the wounds appear stable. Patient using surgical shoes, avoiding much pressure or weightbearing as much as possible 8/16; patient has bilateral foot wounds. 1 on the right lateral foot proximally the other is on the left mid lateral foot. Both required debridement of callus and thick skin around the wounds. We have been using silver collagen 8/27; patient has bilateral lateral foot wounds. The area on the left substantially surrounded by callus and dry skin. This was removed from the wound edge. The underlying wound is small. The area on the right measured somewhat smaller today. 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 Unfortunately I did not update his antibiotics during today's visit. 9/10 I reviewed Dr. Hale Bogus last notes he felt he had completed antibiotics his inflammatory markers were reasonably well controlled. He has a small wound on the lateral left foot and a tiny area on the right which is just above closed. He is using Hydrofera Blue with border foam he has bilateral surgical shoes 9/24; 2 week f/u. doing well. right foot is closed. left foot still undermined. 10/14; right foot remains closed at the fifth met head. The area over the base of the left fifth metatarsal has a small open area but considerable undermining  towards the plantar foot. Thick callus skin around this suggests an adequate pressure relief. We have talked about this. He says he is going to go back into his cam boot. I suggested a total contact cast he did not seem enamored with this suggestion 10/26; left foot base of the fifth metatarsal. Same condition as last time. He has skin over the area with an open wound however the skin is not adherent. He went to see Dr. Earleen Newport who did an x-ray and culture of his foot I have not reviewed the x-ray but the patient was not told anything. He is on doxycycline 11/11; since the  patient was last here he was in the emergency room on 10/30 he was concerned about swelling in the left foot. They did not do any cultures or x-rays. They changed his antibiotics to cephalexin. Previous culture showed group B strep. The cephalexin is appropriate as doxycycline has less than predictable coverage. Arrives in clinic today with swelling over this area under the wound. He also has a new wound on the right fifth metatarsal head 11/18; the patient has a difficult wound on the lateral aspect of the left fifth metatarsal head. The wound was almost ballotable last week I opened it slightly expecting to see purulence however there was just bleeding. I cultured this this was negative. X-ray unchanged. We are trying to get an MRI but I am not sure were going to be able to get this through his insurance. He also has an area on the right lateral fifth metatarsal head this looks healthier 12/3; the patient finally got our MRI. Surprisingly this did not show osteomyelitis. I did show the soft tissue ulceration at the lateral plantar aspect of the fifth metatarsal base with a tiny residual 6 mm abscess overlying the superficial fascia I have tried to culture this area I have not been able to get this to grow anything. Nevertheless the protruding tissue looks aggravated. I suspect we should try to treat the underlying "abscess with broad-spectrum antibiotics. I am going to start him on Levaquin and Flagyl. He has much less edema in his legs and I am going to continue to wrap his legs and see him weekly 12/10. I started Levaquin and Flagyl on him last week. He just picked up the Flagyl apparently there was some delay. The worry is the wound on the left fifth metatarsal base which is substantial and worsening. His foot looks like he inverts at the ankle making this a weightbearing surface. Certainly no improvement in fact I think the measurements of this are somewhat worse. We have been using 12/17; he  apparently just got the Levaquin yesterday this is 2 weeks after the fact. He has completed the Flagyl. The area over the left fifth metatarsal base still has protruding granulation tissue although it does not look quite as bad as it did some weeks ago. He has severe bilateral lymphedema although we have not been treating him for wounds on his legs this is definitely going to require compression. There was so much edema in the left I did not wish to put him in a total contact cast today. I am going to increase his compression from 3-4 layer. The area on the right lateral fifth met head actually look quite good and superficial. 12/23; patient arrived with callus on the right fifth met head and the substantial hyper granulated callused wound on the base of his fifth metatarsal. He says he is completing his Levaquin in 2  days but I do not think that adds up with what I gave him but I will have to double check this. We are using Hydrofera Blue on both areas. My plan is to put the left leg in a cast the week after New Year's 04/06/2020; patient's wounds about the same. Right lateral fifth metatarsal head and left lateral foot over the base of the fifth metatarsal. There is undermining on the left lateral foot which I removed before application of total contact cast continuing with Hydrofera Blue new. Patient tells me he was seen by endocrinology today lab work was done [Dr. Kerr]. Also wondering whether he was referred to cardiology. I went over some lab work from previously does not have chronic renal failure certainly not nephrotic range proteinuria he does have very poorly controlled diabetes but this is not his most updated lab work. Hemoglobin A1c has been over 11 1/10; the patient had a considerable amount of leakage towards mid part of his left foot with macerated skin however the wound surface looks better the area on the right lateral fifth met head is better as well. I am going to change the  dressing on the left foot under the total contact cast to silver alginate, continue with Hydrofera Blue on the right. 1/20; patient was in the total contact cast for 10 days. Considerable amount of drainage although the skin around the wound does not look too bad on the left foot. The area on the right fifth metatarsal head is closed. Our nursing staff reports large amount of drainage out of the left lateral foot wound Objective Constitutional Sitting or standing Blood Pressure is within target range for patient.. Pulse regular and within target range for patient.Marland Kitchen Respirations regular, non-labored and within target range.. Temperature is normal and within the target range for the patient.Marland Kitchen Appears in no distress. Vitals Time Taken: 8:25 AM, Height: 77 in, Weight: 280 lbs, BMI: 33.2, Temperature: 98.2 F, Pulse: 89 bpm, Respiratory Rate: 17 breaths/min, Blood Pressure: 134/81 mmHg, Capillary Blood Glucose: 140 mg/dl. General Notes: Wound exam; left lateral fifth metatarsal base. Substantial wound although somewhat better. There are macerated edges here but I did not debride this today. It does not look like there is significant soft tissue infection. T rule out significant biofilm/bioburden I removed a small piece of tissue for o PCR culture. oo The area on the right fifth metatarsal head is closed Integumentary (Hair, Skin) Wound #3 status is Open. Original cause of wound was Trauma. The wound is located on the Left,Lateral Foot. The wound measures 2.8cm length x 3cm width x 0.9cm depth; 6.597cm^2 area and 5.938cm^3 volume. There is Fat Layer (Subcutaneous Tissue) exposed. There is no tunneling noted, however, there is undermining starting at 2:00 and ending at 4:00 with a maximum distance of 0.9cm. There is a large amount of serous drainage noted. The wound margin is thickened. There is large (67-100%) red, friable granulation within the wound bed. There is a small (1-33%) amount of necrotic  tissue within the wound bed including Adherent Slough. Wound #4 status is Open. Original cause of wound was Gradually Appeared. The wound is located on the Right,Lateral Foot. The wound measures 0cm length x 0cm width x 0cm depth; 0cm^2 area and 0cm^3 volume. There is no tunneling or undermining noted. There is a none present amount of drainage noted. The wound margin is distinct with the outline attached to the wound base. There is no granulation within the wound bed. There is no necrotic tissue  within the wound bed. 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 There was a T Contact Cast otal Procedure by [name], MD. Post procedure Diagnosis Wound #3: Same as Pre-Procedure Plan Follow-up Appointments: Return Appointment in 1 week. Bathing/ Shower/ Hygiene: May shower with protection but do not get wound dressing(s) wet. - may use cast protectors Edema Control - Lymphedema / SCD / Other: Elevate legs to the level of the heart or above for 30 minutes daily and/or when sitting, a frequency of: - throughout the day Avoid standing for long periods of time. Exercise regularly Moisturize legs daily. - right leg every night before bed. Compression stocking or Garment 20-30 mm/Hg pressure to: - Patient to purchase compression stockings. apply to right leg in the morning and remove at night. Off-Loading: T Contact Cast to Left Lower Extremity otal Other: - ***ENSURE TO PAD THE RIGHT LATERAL APSECT OF THE FOOT*** Laboratory ordered were: Anerobic culture- deep tissue culture left foot wound - deep tissue culture of left foot wound. WOUND #3: -  Foot Wound Laterality: Left, Lateral Peri-Wound Care: Zinc Oxide Ointment 30g tube 1 x Per Week/ Discharge Instructions: Apply Zinc Oxide to periwound with each dressing change Peri-Wound Care: Sween Lotion (Moisturizing lotion) 1 x Per Week/ Discharge Instructions: Apply moisturizing lotion as directed Prim Dressing: KerraCel Ag Gelling Fiber Dressing, 4x5 in (silver alginate) 1 x Per Week/ ary Discharge Instructions: Apply silver alginate to wound bed as instructed Secondary Dressing: ABD Pad, 5x9 1 x Per Week/ Discharge Instructions: Apply over primary dressing as directed. Secondary Dressing: Zetuvit Plus 4x8 in 1 x Per Week/ Discharge Instructions: Apply over primary dressing as directed. Secondary Dressing: Drawtex 4x4 in 1 x Per Week/ Discharge Instructions: Apply over primary dressing as directed. Secondary Dressing: T Contact Cast 1 x Per Week/ otal Secured With: [employer], 4.5x3.1 (in/yd) 1 x Per Week/ Discharge Instructions: Secure with Kerlix as directed. 1. For now we continued with the silver alginate base dressings 2. Backup dressings in order to except drainage, zinc oxide 3. I am going to await PCR culture results. I am not thinking that this represents a significant invasive infection but I am more concerned about the biofilm. Might consider aggressive topical antibiotics as well as systemic oral antibiotics pending this result 4. Previous MRI of this foot was negative for osteomyelitis. Electronic Signature(s) Signed: 04/23/2020 4:36:20 PM By: [name] MD Entered By: [name] on 04/23/2020 10:09:50 -------------------------------------------------------------------------------- Total Contact Cast Details Patient Name: Date of Service: [name] D 04/23/2020 8:15 A M Medical Record Number: [id number] Patient Account Number: [id number] Date of Birth/Sex: Treating RN: 12-18-1985 (35 y.o. [name] Primary Care Provider: [name]  Other Clinician: Referring Provider: Treating Provider/Extender: [name] in Treatment: 26 T Contact Cast Applied for Wound Assessment: otal Wound #3 Left,Lateral Foot Performed By: Physician [name], MD Post Procedure Diagnosis Same as Pre-procedure Electronic Signature(s) Signed: 04/23/2020 4:36:20 PM By: [name] MD Entered By: [name] on 04/23/2020 10:05:39 -------------------------------------------------------------------------------- SuperBill Details Patient Name: Date of Service: [name], [name] D 04/23/2020 Medical Record Number: [id number] Patient Account Number: [id number] Date of Birth/Sex: Treating RN: [birth year] (34  y.o. 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

## 2020-04-28 ENCOUNTER — Other Ambulatory Visit: Payer: Self-pay

## 2020-04-28 ENCOUNTER — Encounter (HOSPITAL_BASED_OUTPATIENT_CLINIC_OR_DEPARTMENT_OTHER): Payer: BC Managed Care – PPO | Admitting: Internal Medicine

## 2020-04-28 DIAGNOSIS — E11621 Type 2 diabetes mellitus with foot ulcer: Secondary | ICD-10-CM | POA: Diagnosis not present

## 2020-04-29 NOTE — Progress Notes (Signed)
[name] ([id number]) Visit Report for 04/28/2020 Arrival Information Details Patient Name: Date of Service: [name] D 04/28/2020 7:30 A M Medical Record Number: [id number] Patient Account Number: [id number] Date of Birth/Sex: Treating RN: [birth year] (35 y.o. [name] Primary Care Avenell Sellers: [name] Other Clinician: Referring Naftula Donahue: Treating Marion Seese/Extender: [name] in Treatment: 39 Visit Information History Since Last Visit Added or deleted any medications: No Patient Arrived: Ambulatory Any new allergies or adverse reactions: No Arrival Time: 07:57 Had a fall or experienced change in No Accompanied By: self activities of daily living that may affect Transfer Assistance: None risk of falls: Patient Identification Verified: Yes Signs or symptoms of abuse/neglect since last visito No Secondary Verification Process Completed: Yes Hospitalized since last visit: No Patient Requires Transmission-Based Precautions: No Implantable device outside of the clinic excluding No Patient Has Alerts: No cellular tissue based products placed in the center since last visit: Has Dressing in Place as Prescribed: Yes Has Footwear/Offloading in Place as Prescribed: Yes Left: T Contact Cast otal Pain Present Now: No Electronic Signature(s) Signed: 04/28/2020 5:42:27 PM By: Baruch Gouty RN, BSN Entered By: Baruch Gouty on 04/28/2020 07:57:56 -------------------------------------------------------------------------------- Compression Therapy Details Patient Name: Date of Service: [name], [name] 04/28/2020 7:30 A M Medical Record Number: [id number] Patient Account Number: [id number] Date of Birth/Sex: Treating RN: 1985/06/19 (35 y.o. [name] Primary Care Jenny Lai: [name] Other Clinician: Referring Kareli Hossain: Treating Desarea Ohagan/Extender: [name] in Treatment: 26 Compression  Therapy Performed for Wound Assessment: Wound #3 Left,Lateral Foot Performed By: Clinician Rhae Hammock, RN Compression Type: Four Layer Post Procedure Diagnosis Same as Pre-procedure Electronic Signature(s) Signed: 04/29/2020 5:56:51 PM By: Rhae Hammock RN Entered By: Rhae Hammock on 04/28/2020 09:00:55 -------------------------------------------------------------------------------- Encounter Discharge Information Details Patient Name: Date of Service: [name], [name] 04/28/2020 7:30 A M Medical Record Number: [id number] Patient Account Number: [id number] Date of Birth/Sex: Treating RN: July 06, 1985 (35 y.o. [name] Primary Care Kord Monette: [name] Other Clinician: Referring Deonne Rooks: Treating Tymothy Cass/Extender: [name] in Treatment: 26 Encounter Discharge Information Items Discharge Condition: Stable Ambulatory Status: Ambulatory Discharge Destination: Home Transportation: Private Auto Accompanied By: self Schedule Follow-up Appointment: Yes Clinical Summary of Care: Electronic Signature(s) Signed: 04/28/2020 6:32:06 PM By: Deon Pilling Entered By: Deon Pilling on 04/28/2020 10:46:14 -------------------------------------------------------------------------------- Lower Extremity Assessment Details Patient Name: Date of Service: [name] D 04/28/2020 7:30 A M Medical Record Number: [id number] Patient Account Number: [id number] Date of Birth/Sex: Treating RN: [birth year] (35 y.o. [name] Primary Care Mahnoor Mathisen: [name] Other Clinician: Referring Laisha Rau: Treating Lorielle Boehning/Extender: [name] in Treatment: 26 Edema Assessment Assessed: [name]: No] [name]: No] Edema: [Left: Ye] [Right: s] Calf Left: Right: Point of Measurement: 48 cm From Medial Instep 53.3 cm Ankle Left: Right: Point of Measurement: 10 cm From Medial Instep 33.2 cm Vascular  Assessment Pulses: Dorsalis Pedis Palpable: [Left:No] Electronic Signature(s) Signed: 04/28/2020 5:42:27 PM By: Baruch Gouty RN, BSN Entered By: Baruch Gouty on 04/28/2020 08:17:47 -------------------------------------------------------------------------------- Multi Wound Chart Details Patient Name: Date of Service: [name], [name] 04/28/2020 7:30 A M Medical Record Number: [id number] Patient Account Number: [id number] Date of Birth/Sex: Treating RN: 1986-01-12 (35 y.o. [name] Primary Care Dlisa Barnwell: [name] Other Clinician: Referring Vash Quezada: Treating Jozey Janco/Extender: [name] in Treatment: 26 Vital Signs Height(in): 77 Capillary Blood Glucose(mg/dl): 150 Weight(lbs): 280 Pulse(bpm): 91 Body Mass Index(BMI): 33 Blood Pressure(mmHg): 130/91  Temperature(F): 98.3 Respiratory Rate(breaths/min): 18 Photos: [3:No Photos Left, Lateral Foot] [N/A:N/A N/A] Wound Location: [3:Trauma] [N/A:N/A] Wounding Event: [3:Diabetic Wound/Ulcer of the Lower] [N/A:N/A] Primary Etiology: [3:Extremity Type II Diabetes] [N/A:N/A] Comorbid History: [3:10/02/2019] [N/A:N/A] Date Acquired: [3:26] [N/A:N/A] Weeks of Treatment: [3:Open] [N/A:N/A] Wound Status: [3:2.8x3x0.6] [N/A:N/A] Measurements L x W x D (cm) [3:6.597] [N/A:N/A] A (cm) : rea [3:3.958] [N/A:N/A] Volume (cm) : [3:-300.10%] [N/A:N/A] % Reduction in A rea: [3:-2298.80%] [N/A:N/A] % Reduction in Volume: [3:Grade 2] [N/A:N/A] Classification: [3:Large] [N/A:N/A] Exudate A mount: [3:Serosanguineous] [N/A:N/A] Exudate Type: [3:red, brown] [N/A:N/A] Exudate Color: [3:Thickened] [N/A:N/A] Wound Margin: [3:Large (67-100%)] [N/A:N/A] Granulation A mount: [3:Red, Friable] [N/A:N/A] Granulation Quality: [3:Small (1-33%)] [N/A:N/A] Necrotic A mount: [3:Fat Layer (Subcutaneous Tissue): Yes N/A] Exposed Structures: [3:Fascia: No Tendon: No Muscle: No Joint: No Bone: No Small  (1-33%)] [N/A:N/A] Epithelialization: [3:Compression Therapy] [N/A:N/A] Treatment Notes Electronic Signature(s) Signed: 04/28/2020 5:14:25 PM By: [name] MD Signed: 04/29/2020 5:56:51 PM By: Rhae Hammock RN Entered By: [name] on 04/28/2020 09:02:22 -------------------------------------------------------------------------------- Multi-Disciplinary Care Plan Details Patient Name: Date of Service: [name], [name] 04/28/2020 7:30 A M Medical Record Number: [id number] Patient Account Number: [id number] Date of Birth/Sex: Treating RN: November 10, 1985 (35 y.o. [name], [name] Primary Care Kassadi Presswood: [name] Other Clinician: Referring Manuelita Moxon: Treating Ilayda Toda/Extender: [name] in Treatment: 26 Active Inactive Nutrition Nursing Diagnoses: Imbalanced nutrition Potential for alteratiion in Nutrition/Potential for imbalanced nutrition Goals: Patient/caregiver agrees to and verbalizes understanding of need to use nutritional supplements and/or vitamins as prescribed Date Initiated: 10/24/2019 Date Inactivated: 04/06/2020 Target Resolution Date: 04/03/2020 Goal Status: Met Patient/caregiver will maintain therapeutic glucose control Date Initiated: 10/24/2019 Target Resolution Date: 06/05/2020 Goal Status: Active Interventions: Assess HgA1c results as ordered upon admission and as needed Assess patient nutrition upon admission and as needed per policy Provide education on elevated blood sugars and impact on wound healing Provide education on nutrition Treatment Activities: Education provided on Nutrition : 04/28/2020 Notes: Wound/Skin Impairment Nursing Diagnoses: Impaired tissue integrity Knowledge deficit related to ulceration/compromised skin integrity Goals: Patient/caregiver will verbalize understanding of skin care regimen Date Initiated: 10/24/2019 Target Resolution Date: 06/06/2020 Goal Status: Active Ulcer/skin breakdown  will have a volume reduction of 30% by week 4 Date Initiated: 10/24/2019 Date Inactivated: 01/16/2020 Target Resolution Date: 01/10/2020 Unmet Reason: no change in Goal Status: Unmet measurements. Interventions: Assess patient/caregiver ability to obtain necessary supplies Assess patient/caregiver ability to perform ulcer/skin care regimen upon admission and as needed Assess ulceration(s) every visit Provide education on ulcer and skin care Notes: Electronic Signature(s) Signed: 04/29/2020 5:56:51 PM By: Rhae Hammock RN Entered By: Rhae Hammock on 04/28/2020 09:04:12 -------------------------------------------------------------------------------- Pain Assessment Details Patient Name: Date of Service: [name], [name] 04/28/2020 7:30 A M Medical Record Number: [id number] Patient Account Number: [id number] Date of Birth/Sex: Treating RN: [birth year] (35 y.o. [name] Primary Care Yavonne [name]: [name] Other Clinician: Referring Hazleigh Mccleave: Treating Halleigh Comes/Extender: [name] in Treatment: 26 Active Problems Location of Pain Severity and Description of Pain Patient Has Paino No Site Locations Rate the pain. Current Pain Level: 0 Pain Management and Medication Current Pain Management: Electronic Signature(s) Signed: 04/28/2020 5:42:27 PM By: Baruch Gouty RN, BSN Entered By: Baruch Gouty on 04/28/2020 08:12:45 -------------------------------------------------------------------------------- Patient/Caregiver Education Details Patient Name: Date of Service: [name], [name] 1/25/2022andnbsp7:30 A M Medical Record Number: [id number] Patient Account Number: [id number] Date of Birth/Gender: Treating RN: [birth year] (35 y.o. [name] Primary Care Physician: [name] Other Clinician: Referring Physician: Treating  Physician/Extender: [name] in Treatment: 26 Education  Assessment Education Provided To: Patient Education Topics Provided Nutrition: Methods: Explain/Verbal Responses: State content correctly [employer]) Signed: 04/29/2020 5:56:51 PM By: Rhae Hammock RN Entered By: Rhae Hammock on 04/28/2020 09:04:22 -------------------------------------------------------------------------------- Wound Assessment Details Patient Name: Date of Service: [name], [name] 04/28/2020 7:30 A M Medical Record Number: [id number] Patient Account Number: [id number] Date of Birth/Sex: Treating RN: December 05, 1985 (35 y.o. [name] Primary Care Elayjah Chaney: [name] Other Clinician: Referring Drexel Ivey: Treating Savannaha Stonerock/Extender: [name] in Treatment: 26 Wound Status Wound Number: 3 Primary Etiology: Diabetic Wound/Ulcer of the Lower Extremity Wound Location: Left, Lateral Foot Wound Status: Open Wounding Event: Trauma Comorbid History: Type II Diabetes Date Acquired: 10/02/2019 Weeks Of Treatment: 26 Clustered Wound: No Wound Measurements Length: (cm) 2.8 Width: (cm) 3 Depth: (cm) 0.6 Area: (cm) 6.597 Volume: (cm) 3.958 % Reduction in Area: -300.1% % Reduction in Volume: -2298.8% Epithelialization: Small (1-33%) Tunneling: No Undermining: No Wound Description Classification: Grade 2 Wound Margin: Thickened Exudate Amount: Large Exudate Type: Serosanguineous Exudate Color: red, brown Foul Odor After Cleansing: No Slough/Fibrino Yes Wound Bed Granulation Amount: Large (67-100%) Exposed Structure Granulation Quality: Red, Friable Fascia Exposed: No Necrotic Amount: Small (1-33%) Fat Layer (Subcutaneous Tissue) Exposed: Yes Necrotic Quality: Adherent Slough Tendon Exposed: No Muscle Exposed: No Joint Exposed: No Bone Exposed: No Treatment Notes Wound #3 (Foot) Wound Laterality: Left, Lateral Cleanser Soap and Water Discharge Instruction: May shower and wash wound with dial  antibacterial soap and water prior to dressing change. Peri-Wound Care Zinc Oxide Ointment 30g tube Discharge Instruction: Apply Zinc Oxide to periwound with each dressing change Sween Lotion (Moisturizing lotion) Discharge Instruction: Apply moisturizing lotion as directed Topical Triple Antibiotic Ointment, 1 (oz) Tube Primary Dressing KerraCel Ag Gelling Fiber Dressing, 4x5 in (silver alginate) Discharge Instruction: Apply silver alginate to wound bed as instructed Secondary Dressing ABD Pad, 5x9 Discharge Instruction: Apply over primary dressing as directed. Zetuvit Plus 4x8 in Discharge Instruction: Apply over primary dressing as directed. Drawtex 4x4 in Discharge Instruction: Apply over primary dressing as directed. Secured With [employer], 4.5x3.1 (in/yd) Discharge Instruction: Secure with Kerlix as directed. Compression Wrap FourPress (4 layer compression wrap) Discharge Instruction: Apply four layer compression as directed. Compression Stockings Add-Ons felt Discharge Instruction: Use felt around wound for offloading Electronic Signature(s) Signed: 04/28/2020 5:42:27 PM By: Baruch Gouty RN, BSN Entered By: Baruch Gouty on 04/28/2020 08:21:08 -------------------------------------------------------------------------------- [place] Details Patient Name: Date of Service: [name], [name] 04/28/2020 7:30 A M Medical Record Number: [id number] Patient Account Number: [id number] Date of Birth/Sex: Treating RN: Jul 25, 1985 (35 y.o. [name] Primary Care Lanah Steines: [name] Other Clinician: Referring Graviel Payeur: Treating Allysson Rinehimer/Extender: [name] in Treatment: 26 Vital Signs Time Taken: 08:00 Temperature (F): 98.3 Height (in): 77 Pulse (bpm): 91 Source: Stated Respiratory Rate (breaths/min): 18 Weight (lbs): 280 Blood Pressure (mmHg): 130/91 Source: Stated Capillary Blood Glucose (mg/dl): 150 Body Mass  Index (BMI): 33.2 Reference Range: 80 - 120 mg / dl Notes glucose per pt report this am Electronic Signature(s) Signed: 04/28/2020 5:42:27 PM By: Baruch Gouty RN, BSN Entered By: Baruch Gouty on 04/28/2020 08:12:35

## 2020-04-29 NOTE — Progress Notes (Signed)
[place], [name] ([id number]) Visit Report for 04/28/2020 HPI Details Patient Name: Date of Service: [name] 04/28/2020 7:30 A M Medical Record Number: [id number] Patient Account Number: [id number] Date of Birth/Sex: Treating RN: 1985-12-22 (35 y.o. 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 He comes in for review of a wound over the plantar fifth metatarsal head extending into the lateral part of the foot. He was followed for this previously by his podiatrist Dr. Cornelius Moras. As the patient tells his story he went to see podiatry first for a swelling he developed on the lateral part of his fifth metatarsal head in May. He states this was "open" by podiatry and the area closed. He was followed up in June and it was again opened callus removed and it closed promptly. There were plans being made for surgery on the fifth metatarsal head in June however his blood sugar was apparently too high for anesthesia. Apparently the area was debrided and opened again in June and it is never closed since. Looking over the records from podiatry I am really not able to follow this. It was clear when he was first seen it was before 5/14 at that point he already had a wound. By 5/17 the ulcer was resolved. I do not see anything about a procedure. On 5/28 noted to have pre-ulcerative moderate keratosis. X-ray noted 1/5 contracted toe and tailor's bunion and metatarsal deformity. On a visit date on 09/28/2018 the dorsal part of the left foot it healed and resolved. There was concern about swelling in his lower extremity he was sent to the ER.. As far as I can tell he was seen in the ER on 7/12 with an ulcer on his left foot. A DVT rule  out of the left leg was negative. I do not think I have complete records from podiatry but I am not able to verify the procedures this patient states he had. He states after the last procedure the wound has never closed although I am not able to follow this in the records I have from podiatry. He has not had a recent x-ray The patient has been using Neosporin on the wound. He is wearing a Darco shoe. He is still very active up on his foot working and exercising. Past medical history; type 2 diabetes ketosis-prone, leg swelling with a negative DVT study in July. Non-smoker ABI in our clinic was 0.85 on the left 10/16; substantial wound on the plantar left fifth met head extending laterally almost to the dorsal fifth MTP. We have been using silver alginate we gave him a Darco forefoot off loader. An x-ray did not show evidence of osteomyelitis did note soft tissue emphysema which I think was due to gas tracking through an open wound. There is no doubt in my mind he requires an MRI 10/23; MRI not booked until 3 November at the earliest this is largely due to his glucose sensor in the right arm. We have been using silver alginate. There has been an improvement 10/29; I am still not exactly sure when his MRI is booked for. He says it is the third but it is the 10th in epic. This definitely needs to be done. He is running a low-grade fever today but no other symptoms. No real  improvement in the 1 02/26/2019 patient presents today for a follow-up visit here in our clinic he is last been seen in the clinic on October 29. Subsequently we were working on getting MRI to evaluate and see what exactly was going on and where we would need to go from the standpoint of whether or not he had osteomyelitis and again what treatments were going be required. Subsequently the patient ended up being admitted to the hospital on 02/07/2019 and was discharged on 02/14/2019. This is a somewhat interesting admission with a  discharge diagnosis of pneumonia due to COVID-19 although he was positive for COVID-19 when tested at the urgent care but negative x2 when he was actually in the hospital. With that being said he did have acute respiratory failure with hypoxia and it was noted he also have a left foot ulceration with osteomyelitis. With that being said he did require oxygen for his pneumonia and I level 4 L. He was placed on antivirals and steroids for the COVID-19. He was also transferred to the [facility] at one point. Nonetheless he did subsequently discharged home and since being home has done much better in that regard. The CT angiogram did not show any pulmonary embolism. With regard to the osteomyelitis the patient was placed on vancomycin and Zosyn while in the hospital but has been changed to Augmentin at discharge. It was also recommended that he follow- up with wound care and podiatry. Podiatry however wanted him to see [name] according to the patient prior to them doing anything further. His hemoglobin A1c was 9.9 as noted in the hospital. Have an MRI of the left foot performed while in the hospital on 02/04/2019. 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 There is an overlying plantar open wound noted an abscess tracking back along the lateral aspect of the fifth metatarsal shaft. There is otherwise diffuse cellulitis and mild fasciitis without findings of polymyositis. The patient did have recently pneumonia secondary to COVID-19 I looked in the chart through epic and it does appear that the patient may need to have an additional x-ray just to ensure everything is cleared and that he has no airspace disease prior to putting him into the [name]. 03/05/2019; patient was readmitted to the clinic last week. He was hospitalized twice for a viral upper respiratory tract infection from 11/1 through 11/4 and  then 11/5 through 11/12 ultimately this turned out to be Covid pneumonitis. Although he was discharged on oxygen he is not using it. He says he feels fine. He has no exercise limitation no cough no sputum. His O2 sat in our clinic today was 100% on room air. 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 He received Vanco and Zosyn in the hospital and then was discharged on 2 weeks of Augmentin. I do not see any relevant cultures. He was supposed to follow-up with infectious disease but I do not see that he has an appointment. 12/8; patient saw Dr. Novella Olive of infectious disease last week. He felt that he had had adequate antibiotic therapy. He did not go to follow-up with Dr. Amalia Hailey of podiatry and I have again talked to him about the pros and cons of this. He does not want to consider a ray amputation of this time. He is aware of the risks  of recurrence, migration etc. He started HBO today and tolerated this well. He can complete the Augmentin that I gave him last week. I have looked over the lab work that Dr. Chana Bode ordered his C-reactive protein was 3.3 and his sedimentation rate was 17. The C-reactive protein is never really been measurably that high in this patient 12/15; not much change in the wound today however he has undermining along the lateral part of the foot again more extensively than last week. He has some rims of epithelialization. We have been using silver alginate. He is undergoing hyperbarics but did not dive today 12/18; in for his obligatory first total contact cast change. Unfortunately there was pus coming from the undermining area around his fifth metatarsal head. This was cultured but will preclude reapplication of a cast. He is seen in conjunction with HBO 12/24; patient had staph lugdunensis in the wound in the undermining area laterally last time. We put him on  doxycycline which should have covered this. The wound looks better today. I am going to give him another week of doxycycline before reattempting the total contact cast 12/31; the patient is completing antibiotics. Hemorrhagic debris in the distal part of the wound with some undermining distally. He also had hyper granulation. Extensive debridement with a #5 curette. The infected area that was on the lateral part of the fifth met head is closed over. I do not think he needs any more antibiotics. Patient was seen prior to HBO. Preparations for a total contact cast were made in the cast will be placed post hyperbarics 04/11/19; once again the patient arrives today without complaint. He had been in a cast all week noted that he had heavy drainage this week. This resulted in large raised areas of macerated tissue around the wound 1/14; wound bed looks better slightly smaller. Hydrofera Blue has been changing himself. He had a heavy drainage last week which caused a lot of maceration around the wound so I took him out of a total contact cast he says the drainage is actually better this week He is seen today in conjunction with HBO 1/21; returns to clinic. He was up in [place] for a day or 2 attending a funeral. He comes back in with the wound larger and with a large area of exposed bone. He had osteomyelitis and septic arthritis of the fifth left metatarsal head while he was in hospital. He received IV antibiotics in the hospital for a prolonged period of time then 3 weeks of Augmentin. Subsequently I gave him 2 weeks of doxycycline for more superficial wound infection. When I saw this last week the wound was smaller the surface of the wound looks satisfactory. 1/28; patient missed hyperbarics today. Bone biopsy I did last time showed Enterococcus faecalis and Staphylococcus lugdunensis . He has a wide area of exposed bone. We are going to use silver alginate as of today. I had another ethical discussion  with the patient. 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 In this situation I think the likelihood of healing this is low. Therefore I have recommended a ray amputation and with the patient's agreement I have referred him to Dr. Doran Durand. The other issue is that his compliance with hyperbarics has been minimal because of his work schedule and given his underlying decision I am going to stop this today READMISSION 10/24/2019 MRI 09/29/2019 left foot IMPRESSION: 1. Apparent skin ulceration inferior and lateral to the 5th metatarsal base with underlying heterogeneous T2  signal and enhancement in the subcutaneous fat. Small peripherally enhancing fluid collections along the plantar and lateral aspects of the 5th metatarsal base suspicious for abscesses. 2. Interval amputation through the mid 5th metatarsal with nonspecific low-level marrow edema and enhancement. Given the proximity to the adjacent soft tissue inflammatory changes, osteomyelitis cannot be excluded. 3. The additional bones appear unremarkable. MRI 09/29/2019 right foot IMPRESSION: 1. Soft tissue ulceration lateral to the 5th MTP joint. There is low-level T2 hyperintensity within the 4th and 5th metatarsal heads and adjacent proximal phalanges without abnormal T1 signal or cortical destruction. These findings are nonspecific and could be seen with early marrow edema, hyperemia or early osteomyelitis. No evidence of septic joint. 2. Mild tenosynovitis and synovial enhancement associated with the extensor digitorum tendons at the level of the midfoot. 3. Diffuse low-level muscular T2 hyperintensity and enhancement, most consistent with diabetic myopathy. LEFT FOOT BONE Methicillin resistant staphylococcus aureus Staphylococcus lugdunensis MIC MIC CIPROFLOXACIN >=8 RESISTANT Resistant <=0.5 SENSI... Sensitive CLINDAMYCIN <=0.25 SENS... Sensitive >=8 RESISTANT Resistant ERYTHROMYCIN >=8  RESISTANT Resistant >=8 RESISTANT Resistant GENTAMICIN <=0.5 SENSI... Sensitive <=0.5 SENSI... Sensitive Inducible Clindamycin NEGATIVE Sensitive NEGATIVE Sensitive OXACILLIN >=4 RESISTANT Resistant 2 SENSITIVE Sensitive RIFAMPIN <=0.5 SENSI... Sensitive <=0.5 SENSI... Sensitive TETRACYCLINE <=1 SENSITIVE Sensitive <=1 SENSITIVE Sensitive TRIMETH/SULFA <=10 SENSIT Sensitive <=10 SENSIT Sensitive ... Marland Kitchen.. VANCOMYCIN 1 SENSITIVE Sensitive <=0.5 SENSI... Sensitive Right foot bone . Component 3 wk ago Specimen Description BONE Special Requests RIGHT 4 METATARSAL SAMPLE B Gram Stain NO WBC SEEN NO ORGANISMS SEEN Culture RARE METHICILLIN RESISTANT STAPHYLOCOCCUS AUREUS NO ANAEROBES ISOLATED Performed at [facility], [place] [address], [place], [place] [address] Report Status 10/08/2019 FINAL Organism ID, Bacteria METHICILLIN RESISTANT STAPHYLOCOCCUS AUREUS Resulting Agency [facility] Susceptibility Methicillin resistant staphylococcus aureus MIC CIPROFLOXACIN >=8 RESISTANT Resistant CLINDAMYCIN <=0.25 SENS... Sensitive ERYTHROMYCIN >=8 RESISTANT Resistant GENTAMICIN <=0.5 SENSI... Sensitive Inducible Clindamycin NEGATIVE Sensitive OXACILLIN >=4 RESISTANT Resistant RIFAMPIN <=0.5 SENSI... Sensitive TETRACYCLINE <=1 SENSITIVE Sensitive TRIMETH/SULFA <=10 SENSIT Sensitive ... VANCOMYCIN 1 SENSITIVE Sensitive This is a patient we had in clinic earlier this year with a wound over his left fifth metatarsal head. 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 Doran Durand on 05/16/2019. He seems to have developed problems with wounds on his bilateral feet in June 2021 although he may have had problems earlier than  this. He was in an urgent care with a right foot ulcer on 09/26/2019 and given a course of doxycycline. This was apparently after having trouble getting into see orthopedics. He was seen by podiatry on 09/28/2019 noted to have bilateral lower extremity ulcers including the left lateral fifth metatarsal base and the right subfifth met head. It was noted that had purulent drainage at that time. He required hospitalization from 6/20 through 7/2. This was because of worsening right foot wounds. He underwent bilateral operative incision and drainage and bone biopsies bilaterally. Culture results are listed above. He has been referred back to clinic by Dr. Jacqualyn Posey of podiatry. He is also followed by Dr. Megan Salon who saw him yesterday. He was discharged from hospital on Zyvox Flagyl and Levaquin and yesterday changed to doxycycline Flagyl and Levaquin. His inflammatory markers on 6/26 showed a sedimentation rate of 129 and a C-reactive protein of 5. This  is improved to 14 and 1.3 respectively. This would indicate improvement. ABIs in our clinic today were 1.23 on the right and 1.20 on the left 11/01/2019 on evaluation today patient appears to be doing fairly well in regard to the wounds on his feet at this point. Fortunately there is no signs of active infection at this time. No fevers, chills, nausea, vomiting, or diarrhea. He currently is seeing infectious disease and still under their care at this point. Subsequently he also has both wounds which she has not been using collagen on as he did not receive that in his packaging he did not call us and let us know that. Apparently that just was missed on the order. Nonetheless we will get that straightened out today. 8/9-Patient returns for bilateral foot wounds, using Prisma with hydrogel moistened dressings, and the wounds appear stable. Patient using surgical shoes, avoiding much pressure or weightbearing as much as possible 8/16; patient has bilateral foot  wounds. 1 on the right lateral foot proximally the other is on the left mid lateral foot. Both required debridement of callus and thick skin around the wounds. We have been using silver collagen 8/27; patient has bilateral lateral foot wounds. The area on the left substantially surrounded by callus and dry skin. This was removed from the wound edge. The underlying wound is small. The area on the right measured somewhat smaller today. 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 Unfortunately I did not update his antibiotics during today's visit. 9/10 I reviewed Dr. Hale Bogus last notes he felt he had completed antibiotics his inflammatory markers were reasonably well controlled. He has a small wound on the lateral left foot and a tiny area on the right which is just above closed. He is using Hydrofera Blue with border foam he has bilateral surgical shoes 9/24; 2 week f/u. doing well. right foot is closed. left foot still undermined. 10/14; right foot remains closed at the fifth met head. The area over the base of the left fifth metatarsal has a small open area but considerable undermining towards the plantar foot. Thick callus skin around this suggests an adequate pressure relief. We have talked about this. He says he is going to go back into his cam boot. I suggested a total contact cast he did not seem enamored with this suggestion 10/26; left foot base of the fifth metatarsal. Same condition as last time. He has skin over the area with an open wound however the skin is not adherent. He went to see Dr. Earleen Newport who did an x-ray and culture of his foot I have not reviewed the x-ray but the patient was not told anything. He is on doxycycline 11/11; since the patient was last here he was in the emergency room on 10/30 he was concerned about swelling in the left foot. They did not do any cultures or x-rays. They changed his antibiotics to  cephalexin. Previous culture showed group B strep. The cephalexin is appropriate as doxycycline has less than predictable coverage. Arrives in clinic today with swelling over this area under the wound. He also has a new wound on the right fifth metatarsal head 11/18; the patient has a difficult wound on the lateral aspect of the left fifth metatarsal head. The wound was almost ballotable last week I opened it slightly expecting to see purulence however there was just bleeding. I cultured this this was negative. X-ray unchanged. We are trying to get an  MRI but I am not sure were going to be able to get this through his insurance. He also has an area on the right lateral fifth metatarsal head this looks healthier 12/3; the patient finally got our MRI. Surprisingly this did not show osteomyelitis. I did show the soft tissue ulceration at the lateral plantar aspect of the fifth metatarsal base with a tiny residual 6 mm abscess overlying the superficial fascia I have tried to culture this area I have not been able to get this to grow anything. Nevertheless the protruding tissue looks aggravated. I suspect we should try to treat the underlying "abscess with broad-spectrum antibiotics. I am going to start him on Levaquin and Flagyl. He has much less edema in his legs and I am going to continue to wrap his legs and see him weekly 12/10. I started Levaquin and Flagyl on him last week. He just picked up the Flagyl apparently there was some delay. The worry is the wound on the left fifth metatarsal base which is substantial and worsening. His foot looks like he inverts at the ankle making this a weightbearing surface. Certainly no improvement in fact I think the measurements of this are somewhat worse. We have been using 12/17; he apparently just got the Levaquin yesterday this is 2 weeks after the fact. He has completed the Flagyl. The area over the left fifth metatarsal base still has protruding granulation  tissue although it does not look quite as bad as it did some weeks ago. He has severe bilateral lymphedema although we have not been treating him for wounds on his legs this is definitely going to require compression. There was so much edema in the left I did not wish to put him in a total contact cast today. I am going to increase his compression from 3-4 layer. The area on the right lateral fifth met head actually look quite good and superficial. 12/23; patient arrived with callus on the right fifth met head and the substantial hyper granulated callused wound on the base of his fifth metatarsal. He says he is completing his Levaquin in 2 days but I do not think that adds up with what I gave him but I will have to double check this. We are using Hydrofera Blue on both areas. My plan is to put the left leg in a cast the week after New Year's 04/06/2020; patient's wounds about the same. Right lateral fifth metatarsal head and left lateral foot over the base of the fifth metatarsal. There is undermining on the left lateral foot which I removed before application of total contact cast continuing with Hydrofera Blue new. Patient tells me he was seen by endocrinology today lab work was done [Dr. Kerr]. Also wondering whether he was referred to cardiology. I went over some lab work from previously does not have chronic renal failure certainly not nephrotic range proteinuria he does have very poorly controlled diabetes but this is not his most updated lab work. Hemoglobin A1c has been over 11 1/10; the patient had a considerable amount of leakage towards mid part of his left foot with macerated skin however the wound surface looks better the area on the right lateral fifth met head is better as well. I am going to change the dressing on the left foot under the total contact cast to silver alginate, continue with Hydrofera Blue on the right. 1/20; patient was in the total contact cast for 10 days. Considerable  amount of drainage although the skin  around the wound does not look too bad on the left foot. The area on the right fifth metatarsal head is closed. Our nursing staff reports large amount of drainage out of the left lateral foot wound 1/25; continues with copious amounts of drainage described by our intake staff. PCR culture I did last week showed E. coli and Enterococcus faecalis and low quantities. Multiple resistance genes documented including extended spectrum beta lactamase, MRSA, MRSE, quinolone, tetracycline. The wound is not quite as good this week as it was 5 days ago but about the same size Electronic Signature(s) Signed: 04/28/2020 5:14:25 PM By: [name] MD Entered By: [name] on 04/28/2020 09:03:46 -------------------------------------------------------------------------------- Physical Exam Details Patient Name: Date of Service: [name], [name] 04/28/2020 7:30 A M Medical Record Number: [id number] Patient Account Number: [id number] Date of Birth/Sex: Treating RN: Aug 09, 1985 (35 y.o. [name]: [name] Other Clinician: Referring Provider: Treating Provider/Extender: [name] in Treatment: 76 Constitutional Patient is hypertensive.. Pulse regular and within target range for patient.Marland Kitchen Respirations regular, non-labored and within target range.. Temperature is normal and within the target range for the patient.Marland Kitchen Appears in no distress. Cardiovascular Pedal pulses are palpable. Notes Wound exam; left lateral fifth metatarsal base. Substantial wound I do not think any better than last week. Looks a little deeper not quite as healthy in terms of the granulation but no surrounding soft tissue infection. Electronic Signature(s) Signed: 04/28/2020 5:14:25 PM By: [name] MD Entered By: [name] on 04/28/2020  09:04:59 -------------------------------------------------------------------------------- Physician Orders Details Patient Name: Date of Service: [name], [name] 04/28/2020 7:30 A M Medical Record Number: [id number] Patient Account Number: [id number] Date of Birth/Sex: Treating RN: [birth year] (35 y.o. [name]: [name] Other Clinician: Referring Provider: Treating Provider/Extender: [name] in Treatment: 26 Verbal / Phone Orders: No Diagnosis Coding Follow-up Appointments Return Appointment in 1 week. Nurse Visit: - Friday Bathing/ Shower/ Hygiene May shower with protection but do not get wound dressing(s) wet. - may use cast protectors Edema Control - Lymphedema / SCD / Other Bilateral Lower Extremities Elevate legs to the level of the heart or above for 30 minutes daily and/or when sitting, a frequency of: - throughout the day Avoid standing for long periods of time. Exercise regularly Moisturize legs daily. - right leg every night before bed. Compression stocking or Garment 20-30 mm/Hg pressure to: - Patient to purchase compression stockings. apply to right leg in the morning and remove at night. Off-Loading Other: - hold TCC this week Wound Treatment Wound #3 - Foot Wound Laterality: Left, Lateral Cleanser: Soap and Water 1 x Per Week Discharge Instructions: May shower and wash wound with dial antibacterial soap and water prior to dressing change. Peri-Wound Care: Zinc Oxide Ointment 30g tube 1 x Per Week Discharge Instructions: Apply Zinc Oxide to periwound with each dressing change Peri-Wound Care: Sween Lotion (Moisturizing lotion) 1 x Per Week Discharge Instructions: Apply moisturizing lotion as directed Topical: Triple Antibiotic Ointment, 1 (oz) Tube 1 x Per Week Prim Dressing: KerraCel Ag Gelling Fiber Dressing, 4x5 in (silver alginate) 1 x Per Week ary Discharge Instructions: Apply silver  alginate to wound bed as instructed Secondary Dressing: ABD Pad, 5x9 1 x Per Week Discharge Instructions: Apply over primary dressing as directed. Secondary Dressing: Zetuvit Plus 4x8 in 1 x Per Week Discharge Instructions: Apply over primary dressing as directed. Secondary Dressing: Drawtex 4x4 in 1 x Per Week  Discharge Instructions: Apply over primary dressing as directed. Secured With: [employer], 4.5x3.1 (in/yd) 1 x Per Week Discharge Instructions: Secure with Kerlix as directed. Compression Wrap: FourPress (4 layer compression wrap) 1 x Per Week Discharge Instructions: Apply four layer compression as directed. Add-Ons: felt 1 x Per Week Discharge Instructions: Use felt around wound for offloading Electronic Signature(s) Signed: 04/28/2020 5:14:25 PM By: [name] MD Signed: 04/29/2020 5:56:51 PM By: Rhae Hammock RN Entered By: Rhae Hammock on 04/28/2020 09:04:06 -------------------------------------------------------------------------------- Problem List Details Patient Name: Date of Service: [name], [name] 04/28/2020 7:30 A M Medical Record Number: [id number] Patient Account Number: [id number] Date of Birth/Sex: Treating RN: 1985-08-03 (35 y.o. 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, right ankle and foot 10/24/2019 10/24/2019 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, left ankle and foot 10/24/2019 10/24/2019 B95.62 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 10/24/2019 10/24/2019 classified elsewhere Resolved Problems Electronic Signature(s) Signed: 04/28/2020 5:14:25 PM By: [name] MD Entered By: [name] on 04/28/2020 09:02:08 -------------------------------------------------------------------------------- Progress Note Details Patient Name: Date of Service: [name], [name] 04/28/2020 7:30 A M Medical Record Number: [id number] Patient Account Number: [id number] Date of Birth/Sex: Treating RN: [birth year] (35 y.o. 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 He comes in for review of a wound over the plantar fifth metatarsal head extending into the lateral part of the foot. He was followed for this previously by his podiatrist Dr. Cornelius Moras. As the patient tells his story he went to see podiatry first for a swelling he developed on the lateral part of his fifth metatarsal head in May. He states this was "open" by podiatry and the area closed. He was followed up in June and it was again opened callus removed and it closed promptly. There were plans being made for surgery on the fifth metatarsal head in June however his blood sugar was apparently too high for anesthesia. Apparently the area was debrided and opened again in June and it is never closed since. Looking over the records from podiatry I am really not able to follow this. It was clear when he was first seen it was before 5/14 at that point he already had a wound. By 5/17 the ulcer was resolved. I do not see anything about a procedure. On 5/28 noted  to have pre-ulcerative moderate keratosis. X-ray noted 1/5 contracted toe and tailor's bunion and metatarsal deformity. On a visit date on 09/28/2018 the dorsal part of the left foot it healed and resolved. There was concern about swelling in his lower extremity he was sent to the ER.. As far as I can tell he was seen in the ER on 7/12 with an ulcer on his left foot. A DVT rule out of the left leg was negative. I  do not think I have complete records from podiatry but I am not able to verify the procedures this patient states he had. He states after the last procedure the wound has never closed although I am not able to follow this in the records I have from podiatry. He has not had a recent x-ray The patient has been using Neosporin on the wound. He is wearing a Darco shoe. He is still very active up on his foot working and exercising. Past medical history; type 2 diabetes ketosis-prone, leg swelling with a negative DVT study in July. Non-smoker ABI in our clinic was 0.85 on the left 10/16; substantial wound on the plantar left fifth met head extending laterally almost to the dorsal fifth MTP. We have been using silver alginate we gave him a Darco forefoot off loader. An x-ray did not show evidence of osteomyelitis did note soft tissue emphysema which I think was due to gas tracking through an open wound. There is no doubt in my mind he requires an MRI 10/23; MRI not booked until 3 November at the earliest this is largely due to his glucose sensor in the right arm. We have been using silver alginate. There has been an improvement 10/29; I am still not exactly sure when his MRI is booked for. He says it is the third but it is the 10th in epic. This definitely needs to be done. He is running a low-grade fever today but no other symptoms. No real improvement in the 1 02/26/2019 patient presents today for a follow-up visit here in our clinic he is last been seen in the clinic on October 29. Subsequently  we were working on getting MRI to evaluate and see what exactly was going on and where we would need to go from the standpoint of whether or not he had osteomyelitis and again what treatments were going be required. Subsequently the patient ended up being admitted to the hospital on 02/07/2019 and was discharged on 02/14/2019. This is a somewhat interesting admission with a discharge diagnosis of pneumonia due to COVID-19 although he was positive for COVID-19 when tested at the urgent care but negative x2 when he was actually in the hospital. With that being said he did have acute respiratory failure with hypoxia and it was noted he also have a left foot ulceration with osteomyelitis. With that being said he did require oxygen for his pneumonia and I level 4 L. He was placed on antivirals and steroids for the COVID-19. He was also transferred to the [facility] at one point. Nonetheless he did subsequently discharged home and since being home has done much better in that regard. The CT angiogram did not show any pulmonary embolism. With regard to the osteomyelitis the patient was placed on vancomycin and Zosyn while in the hospital but has been changed to Augmentin at discharge. It was also recommended that he follow- up with wound care and podiatry. Podiatry however wanted him to see [name] according to the patient prior to them doing anything further. His hemoglobin A1c was 9.9 as noted in the hospital. Have an MRI of the left foot performed while in the hospital on 02/04/2019. 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 There is an overlying plantar open wound noted an abscess tracking back along the lateral aspect of the fifth metatarsal shaft. There is otherwise diffuse cellulitis and mild fasciitis without findings of polymyositis.  The patient did have recently pneumonia secondary to COVID-19 I looked  in the chart through epic and it does appear that the patient may need to have an additional x-ray just to ensure everything is cleared and that he has no airspace disease prior to putting him into the [name]. 03/05/2019; patient was readmitted to the clinic last week. He was hospitalized twice for a viral upper respiratory tract infection from 11/1 through 11/4 and then 11/5 through 11/12 ultimately this turned out to be Covid pneumonitis. Although he was discharged on oxygen he is not using it. He says he feels fine. He has no exercise limitation no cough no sputum. His O2 sat in our clinic today was 100% on room air. 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 He received Vanco and Zosyn in the hospital and then was discharged on 2 weeks of Augmentin. I do not see any relevant cultures. He was supposed to follow-up with infectious disease but I do not see that he has an appointment. 12/8; patient saw Dr. Novella Olive of infectious disease last week. He felt that he had had adequate antibiotic therapy. He did not go to follow-up with Dr. Amalia Hailey of podiatry and I have again talked to him about the pros and cons of this. He does not want to consider a ray amputation of this time. He is aware of the risks of recurrence, migration etc. He started HBO today and tolerated this well. He can complete the Augmentin that I gave him last week. I have looked over the lab work that Dr. Chana Bode ordered his C-reactive protein was 3.3 and his sedimentation rate was 17. The C-reactive protein is never really been measurably that high in this patient 12/15; not much change in the wound today however he has undermining along the lateral part of the foot again more extensively than last week. He has some rims of epithelialization. We have been using silver alginate. He is undergoing hyperbarics but did not dive  today 12/18; in for his obligatory first total contact cast change. Unfortunately there was pus coming from the undermining area around his fifth metatarsal head. This was cultured but will preclude reapplication of a cast. He is seen in conjunction with HBO 12/24; patient had staph lugdunensis in the wound in the undermining area laterally last time. We put him on doxycycline which should have covered this. The wound looks better today. I am going to give him another week of doxycycline before reattempting the total contact cast 12/31; the patient is completing antibiotics. Hemorrhagic debris in the distal part of the wound with some undermining distally. He also had hyper granulation. Extensive debridement with a #5 curette. The infected area that was on the lateral part of the fifth met head is closed over. I do not think he needs any more antibiotics. Patient was seen prior to HBO. Preparations for a total contact cast were made in the cast will be placed post hyperbarics 04/11/19; once again the patient arrives today without complaint. He had been in a cast all week noted that he had heavy drainage this week. This resulted in large raised areas of macerated tissue around the wound 1/14; wound bed looks better slightly smaller. Hydrofera Blue has been changing himself. He had a heavy drainage last week which caused a lot of maceration around the wound so I took him out of a total contact  cast he says the drainage is actually better this week He is seen today in conjunction with HBO 1/21; returns to clinic. He was up in [place] for a day or 2 attending a funeral. He comes back in with the wound larger and with a large area of exposed bone. He had osteomyelitis and septic arthritis of the fifth left metatarsal head while he was in hospital. He received IV antibiotics in the hospital for a prolonged period of time then 3 weeks of Augmentin. Subsequently I gave him 2 weeks of doxycycline for more  superficial wound infection. When I saw this last week the wound was smaller the surface of the wound looks satisfactory. 1/28; patient missed hyperbarics today. Bone biopsy I did last time showed Enterococcus faecalis and Staphylococcus lugdunensis . He has a wide area of exposed bone. We are going to use silver alginate as of today. I had another ethical discussion with the patient. 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 In this situation I think the likelihood of healing this is low. Therefore I have recommended a ray amputation and with the patient's agreement I have referred him to Dr. Doran Durand. The other issue is that his compliance with hyperbarics has been minimal because of his work schedule and given his underlying decision I am going to stop this today READMISSION 10/24/2019 MRI 09/29/2019 left foot IMPRESSION: 1. Apparent skin ulceration inferior and lateral to the 5th metatarsal base with underlying heterogeneous T2 signal and enhancement in the subcutaneous fat. Small peripherally enhancing fluid collections along the plantar and lateral aspects of the 5th metatarsal base suspicious for abscesses. 2. Interval amputation through the mid 5th metatarsal with nonspecific low-level marrow edema and enhancement. Given the proximity to the adjacent soft tissue inflammatory changes, osteomyelitis cannot be excluded. 3. The additional bones appear unremarkable. MRI 09/29/2019 right foot IMPRESSION: 1. Soft tissue ulceration lateral to the 5th MTP joint. There is low-level T2 hyperintensity within the 4th and 5th metatarsal heads and adjacent proximal phalanges without abnormal T1 signal or cortical destruction. These findings are nonspecific and could be seen with early marrow edema, hyperemia or early osteomyelitis. No evidence of septic joint. 2. Mild tenosynovitis and synovial enhancement associated with the extensor digitorum tendons at the  level of the midfoot. 3. Diffuse low-level muscular T2 hyperintensity and enhancement, most consistent with diabetic myopathy. LEFT FOOT BONE Methicillin resistant staphylococcus aureus Staphylococcus lugdunensis MIC MIC CIPROFLOXACIN >=8 RESISTANT Resistant <=0.5 SENSI... Sensitive CLINDAMYCIN <=0.25 SENS... Sensitive >=8 RESISTANT Resistant ERYTHROMYCIN >=8 RESISTANT Resistant >=8 RESISTANT Resistant GENTAMICIN <=0.5 SENSI... Sensitive <=0.5 SENSI... Sensitive Inducible Clindamycin NEGATIVE Sensitive NEGATIVE Sensitive OXACILLIN >=4 RESISTANT Resistant 2 SENSITIVE Sensitive RIFAMPIN <=0.5 SENSI... Sensitive <=0.5 SENSI... Sensitive TETRACYCLINE <=1 SENSITIVE Sensitive <=1 SENSITIVE Sensitive TRIMETH/SULFA <=10 SENSIT Sensitive <=10 SENSIT Sensitive ... Marland Kitchen.. VANCOMYCIN 1 SENSITIVE Sensitive <=0.5 SENSI... Sensitive Right foot bone . Component 3 wk ago Specimen Description BONE Special Requests RIGHT 4 METATARSAL SAMPLE B Gram Stain NO WBC SEEN NO ORGANISMS SEEN Culture RARE METHICILLIN RESISTANT STAPHYLOCOCCUS AUREUS NO ANAEROBES ISOLATED Performed at [facility], [place] [address], [place], [place] [address] Report Status 10/08/2019 FINAL Organism ID, Bacteria METHICILLIN RESISTANT STAPHYLOCOCCUS AUREUS Resulting Agency [facility] Susceptibility Methicillin resistant staphylococcus aureus MIC CIPROFLOXACIN >=8 RESISTANT Resistant CLINDAMYCIN <=0.25 SENS... Sensitive ERYTHROMYCIN >=8 RESISTANT Resistant GENTAMICIN <=0.5 SENSI... Sensitive Inducible Clindamycin NEGATIVE Sensitive OXACILLIN >=4 RESISTANT Resistant RIFAMPIN <=0.5 SENSI... Sensitive TETRACYCLINE <=1 SENSITIVE Sensitive TRIMETH/SULFA <=10 SENSIT Sensitive ... VANCOMYCIN 1 SENSITIVE Sensitive  This is a patient we had in clinic earlier this year with a wound over his left fifth metatarsal head. 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 Doran Durand on 05/16/2019. He seems to have developed problems with wounds on his bilateral feet in June 2021 although he may have had problems earlier than this. He was in an urgent care with a right foot ulcer on 09/26/2019 and given a course of doxycycline. This was apparently after having trouble getting into see orthopedics. He was seen by podiatry on 09/28/2019 noted to have bilateral lower extremity ulcers including the left lateral fifth metatarsal base and the right subfifth met head. It was noted that had purulent drainage at that time. He required hospitalization from 6/20 through 7/2. This was because of worsening right foot wounds. He underwent bilateral operative incision and drainage and bone biopsies bilaterally. Culture results are listed above. He has been referred back to clinic by Dr. Jacqualyn Posey of podiatry. He is also followed by Dr. Megan Salon who saw him yesterday. He was discharged from hospital on Zyvox Flagyl and Levaquin and yesterday changed to doxycycline Flagyl and Levaquin. His inflammatory markers on 6/26 showed a sedimentation rate of 129 and a C-reactive protein of 5. This is improved to 14 and 1.3 respectively. This would indicate improvement. ABIs in our clinic today were 1.23 on the right and 1.20 on the left 11/01/2019 on evaluation today patient appears to be doing fairly well in regard to the wounds on his feet at this point. Fortunately there is no signs of active infection at this time. No fevers, chills, nausea, vomiting, or diarrhea. He currently is seeing infectious disease and still under their care at this point. Subsequently he also has both wounds which she has not been using collagen on as he did not receive that in his packaging he did not call us and  let us know that. Apparently that just was missed on the order. Nonetheless we will get that straightened out today. 8/9-Patient returns for bilateral foot wounds, using Prisma with hydrogel moistened dressings, and the wounds appear stable. Patient using surgical shoes, avoiding much pressure or weightbearing as much as possible 8/16; patient has bilateral foot wounds. 1 on the right lateral foot proximally the other is on the left mid lateral foot. Both required debridement of callus and thick skin around the wounds. We have been using silver collagen 8/27; patient has bilateral lateral foot wounds. The area on the left substantially surrounded by callus and dry skin. This was removed from the wound edge. The underlying wound is small. The area on the right measured somewhat smaller today. 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 Unfortunately I did not update his antibiotics during today's visit. 9/10 I reviewed Dr. Hale Bogus last notes he felt he had completed antibiotics his inflammatory markers were reasonably well controlled. He has a small wound on the lateral left foot and a tiny area on the right which is just above closed. He is using Hydrofera Blue with border foam he has bilateral surgical shoes 9/24; 2 week f/u. doing well. right foot is closed. left foot still undermined. 10/14; right foot remains closed at the fifth met head.  The area over the base of the left fifth metatarsal has a small open area but considerable undermining towards the plantar foot. Thick callus skin around this suggests an adequate pressure relief. We have talked about this. He says he is going to go back into his cam boot. I suggested a total contact cast he did not seem enamored with this suggestion 10/26; left foot base of the fifth metatarsal. Same condition as last time. He has skin over the area with an open wound however the skin is not  adherent. He went to see Dr. Earleen Newport who did an x-ray and culture of his foot I have not reviewed the x-ray but the patient was not told anything. He is on doxycycline 11/11; since the patient was last here he was in the emergency room on 10/30 he was concerned about swelling in the left foot. They did not do any cultures or x-rays. They changed his antibiotics to cephalexin. Previous culture showed group B strep. The cephalexin is appropriate as doxycycline has less than predictable coverage. Arrives in clinic today with swelling over this area under the wound. He also has a new wound on the right fifth metatarsal head 11/18; the patient has a difficult wound on the lateral aspect of the left fifth metatarsal head. The wound was almost ballotable last week I opened it slightly expecting to see purulence however there was just bleeding. I cultured this this was negative. X-ray unchanged. We are trying to get an MRI but I am not sure were going to be able to get this through his insurance. He also has an area on the right lateral fifth metatarsal head this looks healthier 12/3; the patient finally got our MRI. Surprisingly this did not show osteomyelitis. I did show the soft tissue ulceration at the lateral plantar aspect of the fifth metatarsal base with a tiny residual 6 mm abscess overlying the superficial fascia I have tried to culture this area I have not been able to get this to grow anything. Nevertheless the protruding tissue looks aggravated. I suspect we should try to treat the underlying "abscess with broad-spectrum antibiotics. I am going to start him on Levaquin and Flagyl. He has much less edema in his legs and I am going to continue to wrap his legs and see him weekly 12/10. I started Levaquin and Flagyl on him last week. He just picked up the Flagyl apparently there was some delay. The worry is the wound on the left fifth metatarsal base which is substantial and worsening. His foot looks  like he inverts at the ankle making this a weightbearing surface. Certainly no improvement in fact I think the measurements of this are somewhat worse. We have been using 12/17; he apparently just got the Levaquin yesterday this is 2 weeks after the fact. He has completed the Flagyl. The area over the left fifth metatarsal base still has protruding granulation tissue although it does not look quite as bad as it did some weeks ago. He has severe bilateral lymphedema although we have not been treating him for wounds on his legs this is definitely going to require compression. There was so much edema in the left I did not wish to put him in a total contact cast today. I am going to increase his compression from 3-4 layer. The area on the right lateral fifth met head actually look quite good and superficial. 12/23; patient arrived with callus on the right fifth met head and the substantial hyper granulated  callused wound on the base of his fifth metatarsal. He says he is completing his Levaquin in 2 days but I do not think that adds up with what I gave him but I will have to double check this. We are using Hydrofera Blue on both areas. My plan is to put the left leg in a cast the week after New Year's 04/06/2020; patient's wounds about the same. Right lateral fifth metatarsal head and left lateral foot over the base of the fifth metatarsal. There is undermining on the left lateral foot which I removed before application of total contact cast continuing with Hydrofera Blue new. Patient tells me he was seen by endocrinology today lab work was done [Dr. Kerr]. Also wondering whether he was referred to cardiology. I went over some lab work from previously does not have chronic renal failure certainly not nephrotic range proteinuria he does have very poorly controlled diabetes but this is not his most updated lab work. Hemoglobin A1c has been over 11 1/10; the patient had a considerable amount of leakage towards  mid part of his left foot with macerated skin however the wound surface looks better the area on the right lateral fifth met head is better as well. I am going to change the dressing on the left foot under the total contact cast to silver alginate, continue with Hydrofera Blue on the right. 1/20; patient was in the total contact cast for 10 days. Considerable amount of drainage although the skin around the wound does not look too bad on the left foot. The area on the right fifth metatarsal head is closed. Our nursing staff reports large amount of drainage out of the left lateral foot wound 1/25; continues with copious amounts of drainage described by our intake staff. PCR culture I did last week showed E. coli and Enterococcus faecalis and low quantities. Multiple resistance genes documented including extended spectrum beta lactamase, MRSA, MRSE, quinolone, tetracycline. The wound is not quite as good this week as it was 5 days ago but about the same size Objective Constitutional Patient is hypertensive.. Pulse regular and within target range for patient.Marland Kitchen Respirations regular, non-labored and within target range.. Temperature is normal and within the target range for the patient.Marland Kitchen Appears in no distress. Vitals Time Taken: 8:00 AM, Height: 77 in, Source: Stated, Weight: 280 lbs, Source: Stated, BMI: 33.2, Temperature: 98.3 F, Pulse: 91 bpm, Respiratory Rate: 18 breaths/min, Blood Pressure: 130/91 mmHg, Capillary Blood Glucose: 150 mg/dl. General Notes: glucose per pt report this am Cardiovascular Pedal pulses are palpable. General Notes: Wound exam; left lateral fifth metatarsal base. Substantial wound I do not think any better than last week. Looks a little deeper not quite as healthy in terms of the granulation but no surrounding soft tissue infection. Integumentary (Hair, Skin) Wound #3 status is Open. Original cause of wound was Trauma. The wound is located on the Left,Lateral Foot. The  wound measures 2.8cm length x 3cm width x 0.6cm depth; 6.597cm^2 area and 3.958cm^3 volume. There is Fat Layer (Subcutaneous Tissue) exposed. There is no tunneling or undermining noted. There is a large amount of serosanguineous drainage noted. The wound margin is thickened. There is large (67-100%) red, friable granulation within the wound bed. There is a small (1-33%) amount of necrotic tissue within the wound bed including Adherent Slough. 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 There was a Four Layer Compression Therapy Procedure by Rhae Hammock, RN. Post procedure Diagnosis Wound #3: Same as Pre-Procedure Plan Follow-up Appointments: Return Appointment in 1 week. Nurse Visit: - Friday Bathing/ Shower/ Hygiene: May shower with protection but do not get wound dressing(s) wet. - may use cast protectors Edema Control - Lymphedema / SCD / Other: Elevate legs to the level of the heart or above for 30 minutes daily and/or when sitting, a frequency of: - throughout the day Avoid standing for long periods of time. Exercise regularly Moisturize legs daily. - right leg every night before bed. Compression stocking or Garment 20-30 mm/Hg pressure to: - Patient to purchase compression stockings. apply to right leg in the morning and remove at night. Off-Loading: Other: - hold TCC this week WOUND #3: - Foot Wound Laterality: Left, Lateral Cleanser: Soap and Water 1 x Per Week/ Discharge Instructions: May shower and wash wound with dial antibacterial soap and water prior to dressing change. Peri-Wound Care: Zinc Oxide Ointment 30g tube 1 x Per  Week/ Discharge Instructions: Apply Zinc Oxide to periwound with each dressing change Peri-Wound Care: Sween Lotion (Moisturizing lotion) 1 x Per Week/ Discharge Instructions: Apply moisturizing lotion as directed Topical: Triple Antibiotic Ointment, 1 (oz) Tube 1 x Per Week/ Prim Dressing: KerraCel Ag Gelling Fiber Dressing, 4x5 in (silver alginate) 1 x Per Week/ ary Discharge Instructions: Apply silver alginate to wound bed as instructed Secondary Dressing: ABD Pad, 5x9 1 x Per Week/ Discharge Instructions: Apply over primary dressing as directed. Secondary Dressing: Zetuvit Plus 4x8 in 1 x Per Week/ Discharge Instructions: Apply over primary dressing as directed. Secondary Dressing: Drawtex 4x4 in 1 x Per Week/ Discharge Instructions: Apply over primary dressing as directed. Secured With: [employer], 4.5x3.1 (in/yd) 1 x Per Week/ Discharge Instructions: Secure with Kerlix as directed. Com pression Wrap: FourPress (4 layer compression wrap) 1 x Per Week/ Discharge Instructions: Apply four layer compression as directed. Add-Ons: felt 1 x Per Week/ Discharge Instructions: Use felt around wound for offloading 1. I continue with the silver alginate with backing sit to fit 2 I am going to put Neosporin under the silver alginate lightly over the wound surface this should take care of Enterococcus and E. coli. No evidence of systemic or surrounding infection therefore I did not feel the need for systemic antibiotics 3. Because of the drainage unfortunately I am taking him out of the total contact cast putting the foot in compression however in a surgical shoe Electronic Signature(s) Signed: 04/28/2020 5:14:25 PM By: [name] MD Entered By: [name] on 04/28/2020 09:06:31 -------------------------------------------------------------------------------- SuperBill Details Patient Name: Date of Service: [name], [name] 04/28/2020 Medical Record Number: [id number] Patient  Account Number: [id number] Date of Birth/Sex: Treating RN: May 18, 1985 (35 y.o. [name], [name] Primary Care Provider: [name] Other Clinician: Referring Provider: Treating Provider/Extender: [name] in Treatment: 26 Diagnosis Coding ICD-10 Codes Code Description E11.621 Type 2 diabetes mellitus with foot ulcer L97.528 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ity L97.518 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ity Facility Procedures CPT4 Code: [id number] Description: (Facility Use Only) 29581LT - APPLY MULTLAY COMPRS LWR LT LEG Modifier: Quantity: 1 Physician Procedures : CPT4 Code Description Modifier [id number] - WC PHYS  LEVEL 3 - EST PT ICD-10 Diagnosis Description E11.621 Type 2 diabetes mellitus with foot ulcer L97.528 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ity Quantity: 1 Electronic Signature(s) Signed: 04/28/2020 5:14:25 PM By: [name] MD Entered By: [name] on 04/28/2020 09:07:00

## 2020-05-01 ENCOUNTER — Encounter (HOSPITAL_BASED_OUTPATIENT_CLINIC_OR_DEPARTMENT_OTHER): Payer: BC Managed Care – PPO | Admitting: Internal Medicine

## 2020-05-01 ENCOUNTER — Other Ambulatory Visit: Payer: Self-pay

## 2020-05-01 DIAGNOSIS — E11621 Type 2 diabetes mellitus with foot ulcer: Secondary | ICD-10-CM | POA: Diagnosis not present

## 2020-05-01 NOTE — Progress Notes (Signed)
[place], [name] ([id number]) Visit Report for 05/01/2020 SuperBill Details Patient Name: Date of Service: [name] 05/01/2020 Medical Record Number: [id number] Patient Account Number: [id number] Date of Birth/Sex: Treating RN: May 19, 1985 (35 y.o. [name] Primary Care Provider: [name] Other Clinician: Referring Provider: Treating Provider/Extender: Gwyneth Revels in Treatment: 27 Diagnosis Coding ICD-10 Codes Code Description E11.621 Type 2 diabetes mellitus with foot ulcer L97.528 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ity L97.518 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ity Facility Procedures CPT4 Code Description Modifier Quantity [id number] (Facility Use Only) [phone] - APPLY MULTLAY COMPRS LWR LT LEG 1 Electronic Signature(s) Signed: 05/01/2020 5:33:44 PM By: Baltazar Najjar MD Signed: 05/01/2020 5:52:57 PM By: Zandra Abts RN, BSN Entered By: Zandra Abts on 05/01/2020 16:28:29

## 2020-05-04 NOTE — Progress Notes (Signed)
[name], [name] ([id number]) Visit Report for 05/01/2020 Arrival Information Details Patient Name: Date of Service: [name] D 05/01/2020 4:00 PM Medical Record Number: [id number] Patient Account Number: [id number] Date of Birth/Sex: Treating RN: [birth year] (35 y.o. [name] Primary Care Freemon Binford: [name] Other Clinician: Referring Walaa Carel: Treating Kaylise Blakeley/Extender: Gwyneth Revels in Treatment: 27 Visit Information History Since Last Visit Added or deleted any medications: No Patient Arrived: Ambulatory Any new allergies or adverse reactions: No Arrival Time: 15:56 Had a fall or experienced change in No Accompanied By: self activities of daily living that may affect Transfer Assistance: None risk of falls: Patient Identification Verified: Yes Signs or symptoms of abuse/neglect since last visito No Secondary Verification Process Completed: Yes Hospitalized since last visit: No Patient Requires Transmission-Based Precautions: No Implantable device outside of the clinic excluding No Patient Has Alerts: No cellular tissue based products placed in the center since last visit: Has Dressing in Place as Prescribed: Yes Pain Present Now: No Electronic Signature(s) Signed: 05/04/2020 11:41:01 AM By: Karl Ito Entered By: Karl Ito on 05/01/2020 15:56:51 -------------------------------------------------------------------------------- Compression Therapy Details Patient Name: Date of Service: [name], [name] 05/01/2020 4:00 PM Medical Record Number: [id number] Patient Account Number: [id number] Date of Birth/Sex: Treating RN: 1986/03/01 (35 y.o. [name] Primary Care Frances Joynt: [name] Other Clinician: Referring Latash Nouri: Treating Lucillia Corson/Extender: Gwyneth Revels in Treatment: 27 Compression Therapy Performed for Wound Assessment: Wound #3 Left,Lateral Foot Performed By: Clinician  Zandra Abts, RN Compression Type: Four Layer Electronic Signature(s) Signed: 05/01/2020 5:52:57 PM By: Zandra Abts RN, BSN Entered By: Zandra Abts on 05/01/2020 16:27:56 -------------------------------------------------------------------------------- Encounter Discharge Information Details Patient Name: Date of Service: [name], [name] 05/01/2020 4:00 PM Medical Record Number: [id number] Patient Account Number: [id number] Date of Birth/Sex: Treating RN: January 08, 1986 (35 y.o. [name] Primary Care Elyan Vanwieren: Other Clinician: [name] Referring Kaeo Jacome: Treating Rikayla Demmon/Extender: Gwyneth Revels in Treatment: 27 Encounter Discharge Information Items Discharge Condition: Stable Ambulatory Status: Ambulatory Discharge Destination: Home Transportation: Private Auto Accompanied By: alone Schedule Follow-up Appointment: Yes Clinical Summary of Care: Patient Declined Electronic Signature(s) Signed: 05/01/2020 5:52:57 PM By: Zandra Abts RN, BSN Entered By: Zandra Abts on 05/01/2020 16:28:22 -------------------------------------------------------------------------------- Wound Assessment Details Patient Name: Date of Service: [name], [name] 05/01/2020 4:00 PM Medical Record Number: [id number] Patient Account Number: [id number] Date of Birth/Sex: Treating RN: April 11, 1985 (35 y.o. [name] Primary Care Varina Hulon: [name] Other Clinician: Referring Merrillyn Ackerley: Treating Airica Schwartzkopf/Extender: Gwyneth Revels in Treatment: 27 Wound Status Wound Number: 3 Primary Etiology: Diabetic Wound/Ulcer of the Lower Extremity Wound Location: Left, Lateral Foot Wound Status: Open Wounding Event: Trauma Date Acquired: 10/02/2019 Weeks Of Treatment: 27 Clustered Wound: No Wound Measurements Length: (cm) 2.8 Width: (cm) 3 Depth: (cm) 0.6 Area: (cm) 6.597 Volume: (cm) 3.958 % Reduction in Area: -300.1% %  Reduction in Volume: -2298.8% Wound Description Classification: Grade 2 Treatment Notes Wound #3 (Foot) Wound Laterality: Left, Lateral Cleanser Soap and Water Discharge Instruction: May shower and wash wound with dial antibacterial soap and water prior to dressing change. Peri-Wound Care Zinc Oxide Ointment 30g tube Discharge Instruction: Apply Zinc Oxide to periwound with each dressing change Sween Lotion (Moisturizing lotion) Discharge Instruction: Apply moisturizing lotion as directed Topical Triple Antibiotic Ointment, 1 (oz) Tube Primary Dressing KerraCel Ag Gelling Fiber Dressing, 4x5 in (silver alginate) Discharge Instruction: Apply silver alginate to wound bed as instructed Secondary Dressing ABD Pad, 5x9 Discharge  Instruction: Apply over primary dressing as directed. Zetuvit Plus 4x8 in Discharge Instruction: Apply over primary dressing as directed. Drawtex 4x4 in Discharge Instruction: Apply over primary dressing as directed. Secured With [employer], 4.5x3.1 (in/yd) Discharge Instruction: Secure with Kerlix as directed. Compression Wrap FourPress (4 layer compression wrap) Discharge Instruction: Apply four layer compression as directed. Compression Stockings Add-Ons felt Discharge Instruction: Use felt around wound for offloading Electronic Signature(s) Signed: 05/01/2020 5:52:16 PM By: Zenaida Deed RN, BSN Signed: 05/04/2020 11:41:01 AM By: Karl Ito Entered By: Karl Ito on 05/01/2020 15:57:28 -------------------------------------------------------------------------------- Vitals Details Patient Name: Date of Service: [name], [name] 05/01/2020 4:00 PM Medical Record Number: [id number] Patient Account Number: [id number] Date of Birth/Sex: Treating RN: Dec 27, 1985 (35 y.o. [name] Primary Care Nilza Eaker: [name] Other Clinician: Referring Ellowyn Rieves: Treating [name]/Extender: Gwyneth Revels  in Treatment: 27 Vital Signs Time Taken: 15:56 Temperature (F): 98.5 Height (in): 77 Pulse (bpm): 89 Weight (lbs): 280 Respiratory Rate (breaths/min): 18 Body Mass Index (BMI): 33.2 Blood Pressure (mmHg): 148/94 Capillary Blood Glucose (mg/dl): 235 Reference Range: 80 - 120 mg / dl Electronic Signature(s) Signed: 05/04/2020 11:41:01 AM By: Karl Ito Entered By: Karl Ito on 05/01/2020 15:57:18

## 2020-05-07 ENCOUNTER — Other Ambulatory Visit: Payer: Self-pay

## 2020-05-07 ENCOUNTER — Encounter (HOSPITAL_BASED_OUTPATIENT_CLINIC_OR_DEPARTMENT_OTHER): Payer: BC Managed Care – PPO | Attending: Internal Medicine | Admitting: Internal Medicine

## 2020-05-07 DIAGNOSIS — L97528 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ity: Secondary | ICD-10-CM | POA: Diagnosis not present

## 2020-05-07 DIAGNOSIS — L97518 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ity: Secondary | ICD-10-CM | POA: Insufficient documentation

## 2020-05-07 DIAGNOSIS — E11621 Type 2 diabetes mellitus with foot ulcer: Secondary | ICD-10-CM | POA: Diagnosis not present

## 2020-05-07 DIAGNOSIS — Z8616 Personal history of COVID-19: Secondary | ICD-10-CM | POA: Diagnosis not present

## 2020-05-07 DIAGNOSIS — B952 Enterococcus as the cause of diseases classified elsewhere: Secondary | ICD-10-CM | POA: Diagnosis not present

## 2020-05-07 DIAGNOSIS — B9620 Unspecified Escherichia coli [E. coli] as the cause of diseases classified elsewhere: Secondary | ICD-10-CM | POA: Insufficient documentation

## 2020-05-07 NOTE — Progress Notes (Signed)
[name], [name] ([id number]) Visit Report for 05/07/2020 Debridement Details Patient Name: Date of Service: [name] D 05/07/2020 7:30 A M Medical Record Number: [id number] Patient Account Number: [id number] Date of Birth/Sex: Treating RN: [birth year] (35 y.o. [name] Primary Care Provider: [name] Other Clinician: Referring Provider: Treating Provider/Extender: [name] in Treatment: 28 Debridement Performed for Assessment: Wound #3 Left,Lateral Foot Performed By: Physician [name], MD Debridement Type: Debridement Severity of Tissue Pre Debridement: Fat layer exposed Level of Consciousness (Pre-procedure): Awake and Alert Pre-procedure Verification/Time Out Yes - 08:10 Taken: Start Time: 08:11 Pain Control: Lidocaine 4% T opical Solution T Area Debrided (L x W): otal 3 (cm) x 3.5 (cm) = 10.5 (cm) Tissue and other material debrided: Viable, Non-Viable, Callus, Slough, Subcutaneous, Skin: Dermis , Skin: Epidermis, Fibrin/Exudate, Slough Level: Skin/Subcutaneous Tissue Debridement Description: Excisional Instrument: Curette Bleeding: Moderate Hemostasis Achieved: Pressure End Time: 08:17 Procedural Pain: 0 Post Procedural Pain: 0 Response to Treatment: Procedure was tolerated well Level of Consciousness (Post- Awake and Alert procedure): Post Debridement Measurements of Total Wound Length: (cm) 2.6 Width: (cm) 3.3 Depth: (cm) 1 Volume: (cm) 6.739 Character of Wound/Ulcer Post Debridement: Improved Severity of Tissue Post Debridement: Fat layer exposed Post Procedure Diagnosis Same as Pre-procedure Electronic Signature(s) Signed: 05/07/2020 4:57:15 PM By: [name] MD Signed: 05/07/2020 5:30:16 PM By: Deon Pilling Entered By: [name] on 05/07/2020 08:27:07 -------------------------------------------------------------------------------- HPI Details Patient Name: Date of Service: [name], [name]  05/07/2020 7:30 A M Medical Record Number: [id number] Patient Account Number: [id number] Date of Birth/Sex: Treating RN: Aug 07, 1985 (35 y.o. 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 He comes in for review of a wound over the plantar fifth metatarsal head extending into the lateral part of the foot. He was followed for this previously by his podiatrist Dr. Cornelius Moras. As the patient tells his story he went to see podiatry first for a swelling he developed on the lateral part of his fifth metatarsal head in May. He states this was "open" by podiatry and the area closed. He was followed up in June and it was again opened callus removed and it closed promptly. There were plans being made for surgery on the fifth metatarsal head in June however his blood sugar was apparently too high for anesthesia. Apparently the area was debrided and opened again in June and it is never closed since. Looking over the records from podiatry I am really not able to follow this. It was clear when he was first seen it was before 5/14 at that point he already had a wound. By 5/17 the ulcer was resolved. I do not see anything about a procedure. On 5/28 noted to have pre-ulcerative moderate keratosis. X-ray noted 1/5 contracted toe and tailor's bunion and metatarsal deformity. On a visit date on 09/28/2018 the dorsal part of the left foot it healed and resolved. There was concern about swelling in his lower extremity he was sent to the ER.. As far as I can tell he was seen in the ER on 7/12 with an ulcer on his left foot. A DVT rule out of the left leg was negative. I do not think I have complete records from podiatry but I am not able to verify the  procedures this patient states he had. He states after the last procedure the wound has never closed although I am not able to follow this in the records I have from podiatry. He has not had a recent x-ray The patient has been using Neosporin on the wound. He is wearing a Darco shoe. He is still very active up on his foot working and exercising. Past medical history; type 2 diabetes ketosis-prone, leg swelling with a negative DVT study in July. Non-smoker ABI in our clinic was 0.85 on the left 10/16; substantial wound on the plantar left fifth met head extending laterally almost to the dorsal fifth MTP. We have been using silver alginate we gave him a Darco forefoot off loader. An x-ray did not show evidence of osteomyelitis did note soft tissue emphysema which I think was due to gas tracking through an open wound. There is no doubt in my mind he requires an MRI 10/23; MRI not booked until 3 November at the earliest this is largely due to his glucose sensor in the right arm. We have been using silver alginate. There has been an improvement 10/29; I am still not exactly sure when his MRI is booked for. He says it is the third but it is the 10th in epic. This definitely needs to be done. He is running a low-grade fever today but no other symptoms. No real improvement in the 1 02/26/2019 patient presents today for a follow-up visit here in our clinic he is last been seen in the clinic on October 29. Subsequently we were working on getting MRI to evaluate and see what exactly was going on and where we would need to go from the standpoint of whether or not he had osteomyelitis and again what treatments were going be required. Subsequently the patient ended up being admitted to the hospital on 02/07/2019 and was discharged on 02/14/2019. This is a somewhat interesting admission with a discharge diagnosis of pneumonia due to COVID-19 although he was positive for COVID-19 when tested at the urgent care but  negative x2 when he was actually in the hospital. With that being said he did have acute respiratory failure with hypoxia and it was noted he also have a left foot ulceration with osteomyelitis. With that being said he did require oxygen for his pneumonia and I level 4 L. He was placed on antivirals and steroids for the COVID-19. He was also transferred to the [facility] at one point. Nonetheless he did subsequently discharged home and since being home has done much better in that regard. The CT angiogram did not show any pulmonary embolism. With regard to the osteomyelitis the patient was placed on vancomycin and Zosyn while in the hospital but has been changed to Augmentin at discharge. It was also recommended that he follow- up with wound care and podiatry. Podiatry however wanted him to see [name] according to the patient prior to them doing anything further. His hemoglobin A1c was 9.9 as noted in the hospital. Have an MRI of the left foot performed while in the hospital on 02/04/2019. 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 There is an overlying plantar open wound noted an abscess tracking back along the lateral aspect of the fifth metatarsal shaft. There is otherwise diffuse cellulitis and mild fasciitis without findings of polymyositis. The patient did have recently pneumonia secondary to COVID-19 I looked in the chart through epic and  it does appear that the patient may need to have an additional x-ray just to ensure everything is cleared and that he has no airspace disease prior to putting him into the [name]. 03/05/2019; patient was readmitted to the clinic last week. He was hospitalized twice for a viral upper respiratory tract infection from 11/1 through 11/4 and then 11/5 through 11/12 ultimately this turned out to be Covid pneumonitis. Although he was discharged on oxygen he is not using  it. He says he feels fine. He has no exercise limitation no cough no sputum. His O2 sat in our clinic today was 100% on room air. 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 He received Vanco and Zosyn in the hospital and then was discharged on 2 weeks of Augmentin. I do not see any relevant cultures. He was supposed to follow-up with infectious disease but I do not see that he has an appointment. 12/8; patient saw Dr. Novella Olive of infectious disease last week. He felt that he had had adequate antibiotic therapy. He did not go to follow-up with Dr. Amalia Hailey of podiatry and I have again talked to him about the pros and cons of this. He does not want to consider a ray amputation of this time. He is aware of the risks of recurrence, migration etc. He started HBO today and tolerated this well. He can complete the Augmentin that I gave him last week. I have looked over the lab work that Dr. Chana Bode ordered his C-reactive protein was 3.3 and his sedimentation rate was 17. The C-reactive protein is never really been measurably that high in this patient 12/15; not much change in the wound today however he has undermining along the lateral part of the foot again more extensively than last week. He has some rims of epithelialization. We have been using silver alginate. He is undergoing hyperbarics but did not dive today 12/18; in for his obligatory first total contact cast change. Unfortunately there was pus coming from the undermining area around his fifth metatarsal head. This was cultured but will preclude reapplication of a cast. He is seen in conjunction with HBO 12/24; patient had staph lugdunensis in the wound in the undermining area laterally last time. We put him on doxycycline which should have covered this. The wound looks better today. I am going to give him another week of doxycycline before  reattempting the total contact cast 12/31; the patient is completing antibiotics. Hemorrhagic debris in the distal part of the wound with some undermining distally. He also had hyper granulation. Extensive debridement with a #5 curette. The infected area that was on the lateral part of the fifth met head is closed over. I do not think he needs any more antibiotics. Patient was seen prior to HBO. Preparations for a total contact cast were made in the cast will be placed post hyperbarics 04/11/19; once again the patient arrives today without complaint. He had been in a cast all week noted that he had heavy drainage this week. This resulted in large raised areas of macerated tissue around the wound 1/14; wound bed looks better slightly smaller. Hydrofera Blue has been changing himself. He had a heavy drainage last week which caused a lot of maceration around the wound so I took him out of a total contact cast he says the drainage is actually better this week He is seen today in conjunction with  HBO 1/21; returns to clinic. He was up in [place] for a day or 2 attending a funeral. He comes back in with the wound larger and with a large area of exposed bone. He had osteomyelitis and septic arthritis of the fifth left metatarsal head while he was in hospital. He received IV antibiotics in the hospital for a prolonged period of time then 3 weeks of Augmentin. Subsequently I gave him 2 weeks of doxycycline for more superficial wound infection. When I saw this last week the wound was smaller the surface of the wound looks satisfactory. 1/28; patient missed hyperbarics today. Bone biopsy I did last time showed Enterococcus faecalis and Staphylococcus lugdunensis . He has a wide area of exposed bone. We are going to use silver alginate as of today. I had another ethical discussion with the patient. 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 In this situation I think  the likelihood of healing this is low. Therefore I have recommended a ray amputation and with the patient's agreement I have referred him to Dr. Doran Durand. The other issue is that his compliance with hyperbarics has been minimal because of his work schedule and given his underlying decision I am going to stop this today READMISSION 10/24/2019 MRI 09/29/2019 left foot IMPRESSION: 1. Apparent skin ulceration inferior and lateral to the 5th metatarsal base with underlying heterogeneous T2 signal and enhancement in the subcutaneous fat. Small peripherally enhancing fluid collections along the plantar and lateral aspects of the 5th metatarsal base suspicious for abscesses. 2. Interval amputation through the mid 5th metatarsal with nonspecific low-level marrow edema and enhancement. Given the proximity to the adjacent soft tissue inflammatory changes, osteomyelitis cannot be excluded. 3. The additional bones appear unremarkable. MRI 09/29/2019 right foot IMPRESSION: 1. Soft tissue ulceration lateral to the 5th MTP joint. There is low-level T2 hyperintensity within the 4th and 5th metatarsal heads and adjacent proximal phalanges without abnormal T1 signal or cortical destruction. These findings are nonspecific and could be seen with early marrow edema, hyperemia or early osteomyelitis. No evidence of septic joint. 2. Mild tenosynovitis and synovial enhancement associated with the extensor digitorum tendons at the level of the midfoot. 3. Diffuse low-level muscular T2 hyperintensity and enhancement, most consistent with diabetic myopathy. LEFT FOOT BONE Methicillin resistant staphylococcus aureus Staphylococcus lugdunensis MIC MIC CIPROFLOXACIN >=8 RESISTANT Resistant <=0.5 SENSI... Sensitive CLINDAMYCIN <=0.25 SENS... Sensitive >=8 RESISTANT Resistant ERYTHROMYCIN >=8 RESISTANT Resistant >=8 RESISTANT Resistant GENTAMICIN <=0.5 SENSI... Sensitive <=0.5 SENSI... Sensitive Inducible Clindamycin  NEGATIVE Sensitive NEGATIVE Sensitive OXACILLIN >=4 RESISTANT Resistant 2 SENSITIVE Sensitive RIFAMPIN <=0.5 SENSI... Sensitive <=0.5 SENSI... Sensitive TETRACYCLINE <=1 SENSITIVE Sensitive <=1 SENSITIVE Sensitive TRIMETH/SULFA <=10 SENSIT Sensitive <=10 SENSIT Sensitive ... Marland Kitchen.. VANCOMYCIN 1 SENSITIVE Sensitive <=0.5 SENSI... Sensitive Right foot bone . Component 3 wk ago Specimen Description BONE Special Requests RIGHT 4 METATARSAL SAMPLE B Gram Stain NO WBC SEEN NO ORGANISMS SEEN Culture RARE METHICILLIN RESISTANT STAPHYLOCOCCUS AUREUS NO ANAEROBES ISOLATED Performed at [facility], [place] [address], [place],  [address] Report Status 10/08/2019 FINAL Organism ID, Bacteria METHICILLIN RESISTANT STAPHYLOCOCCUS AUREUS Resulting Agency [facility] Susceptibility Methicillin resistant staphylococcus aureus MIC CIPROFLOXACIN >=8 RESISTANT Resistant CLINDAMYCIN <=0.25 SENS... Sensitive ERYTHROMYCIN >=8 RESISTANT Resistant GENTAMICIN <=0.5 SENSI... Sensitive Inducible Clindamycin NEGATIVE Sensitive OXACILLIN >=4 RESISTANT Resistant RIFAMPIN <=0.5 SENSI... Sensitive TETRACYCLINE <=1 SENSITIVE Sensitive TRIMETH/SULFA <=10 SENSIT Sensitive ... VANCOMYCIN 1 SENSITIVE Sensitive This is a patient we had in clinic earlier this year with a wound over his left  fifth metatarsal head. 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 Doran Durand on 05/16/2019. He seems to have developed problems with wounds on his bilateral feet in June 2021 although he may have had problems earlier than this. He was in an urgent care with a right foot ulcer on 09/26/2019 and given a course of doxycycline. This was apparently after  having trouble getting into see orthopedics. He was seen by podiatry on 09/28/2019 noted to have bilateral lower extremity ulcers including the left lateral fifth metatarsal base and the right subfifth met head. It was noted that had purulent drainage at that time. He required hospitalization from 6/20 through 7/2. This was because of worsening right foot wounds. He underwent bilateral operative incision and drainage and bone biopsies bilaterally. Culture results are listed above. He has been referred back to clinic by Dr. Jacqualyn Posey of podiatry. He is also followed by Dr. Megan Salon who saw him yesterday. He was discharged from hospital on Zyvox Flagyl and Levaquin and yesterday changed to doxycycline Flagyl and Levaquin. His inflammatory markers on 6/26 showed a sedimentation rate of 129 and a C-reactive protein of 5. This is improved to 14 and 1.3 respectively. This would indicate improvement. ABIs in our clinic today were 1.23 on the right and 1.20 on the left 11/01/2019 on evaluation today patient appears to be doing fairly well in regard to the wounds on his feet at this point. Fortunately there is no signs of active infection at this time. No fevers, chills, nausea, vomiting, or diarrhea. He currently is seeing infectious disease and still under their care at this point. Subsequently he also has both wounds which she has not been using collagen on as he did not receive that in his packaging he did not call us and let us know that. Apparently that just was missed on the order. Nonetheless we will get that straightened out today. 8/9-Patient returns for bilateral foot wounds, using Prisma with hydrogel moistened dressings, and the wounds appear stable. Patient using surgical shoes, avoiding much pressure or weightbearing as much as possible 8/16; patient has bilateral foot wounds. 1 on the right lateral foot proximally the other is on the left mid lateral foot. Both required debridement of callus  and thick skin around the wounds. We have been using silver collagen 8/27; patient has bilateral lateral foot wounds. The area on the left substantially surrounded by callus and dry skin. This was removed from the wound edge. The underlying wound is small. The area on the right measured somewhat smaller today. 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 Unfortunately I did not update his antibiotics during today's visit. 9/10 I reviewed Dr. Hale Bogus last notes he felt he had completed antibiotics his inflammatory markers were reasonably well controlled. He has a small wound on the lateral left foot and a tiny area on the right which is just above closed. He is using Hydrofera Blue with border foam he has bilateral surgical shoes 9/24; 2 week f/u. doing well. right foot is closed. left foot still undermined. 10/14; right foot remains closed at the fifth met head. The area over the base of the left fifth metatarsal has a small open area but considerable  undermining towards the plantar foot. Thick callus skin around this suggests an adequate pressure relief. We have talked about this. He says he is going to go back into his cam boot. I suggested a total contact cast he did not seem enamored with this suggestion 10/26; left foot base of the fifth metatarsal. Same condition as last time. He has skin over the area with an open wound however the skin is not adherent. He went to see Dr. Loreta Ave who did an x-ray and culture of his foot I have not reviewed the x-ray but the patient was not told anything. He is on doxycycline 11/11; since the patient was last here he was in the emergency room on 10/30 he was concerned about swelling in the left foot. They did not do any cultures or x-rays. They changed his antibiotics to cephalexin. Previous culture showed group B strep. The cephalexin is appropriate as doxycycline has less than predictable  coverage. Arrives in clinic today with swelling over this area under the wound. He also has a new wound on the right fifth metatarsal head 11/18; the patient has a difficult wound on the lateral aspect of the left fifth metatarsal head. The wound was almost ballotable last week I opened it slightly expecting to see purulence however there was just bleeding. I cultured this this was negative. X-ray unchanged. We are trying to get an MRI but I am not sure were going to be able to get this through his insurance. He also has an area on the right lateral fifth metatarsal head this looks healthier 12/3; the patient finally got our MRI. Surprisingly this did not show osteomyelitis. I did show the soft tissue ulceration at the lateral plantar aspect of the fifth metatarsal base with a tiny residual 6 mm abscess overlying the superficial fascia I have tried to culture this area I have not been able to get this to grow anything. Nevertheless the protruding tissue looks aggravated. I suspect we should try to treat the underlying "abscess with broad-spectrum antibiotics. I am going to start him on Levaquin and Flagyl. He has much less edema in his legs and I am going to continue to wrap his legs and see him weekly 12/10. I started Levaquin and Flagyl on him last week. He just picked up the Flagyl apparently there was some delay. The worry is the wound on the left fifth metatarsal base which is substantial and worsening. His foot looks like he inverts at the ankle making this a weightbearing surface. Certainly no improvement in fact I think the measurements of this are somewhat worse. We have been using 12/17; he apparently just got the Levaquin yesterday this is 2 weeks after the fact. He has completed the Flagyl. The area over the left fifth metatarsal base still has protruding granulation tissue although it does not look quite as bad as it did some weeks ago. He has severe bilateral lymphedema although we  have not been treating him for wounds on his legs this is definitely going to require compression. There was so much edema in the left I did not wish to put him in a total contact cast today. I am going to increase his compression from 3-4 layer. The area on the right lateral fifth met head actually look quite good and superficial. 12/23; patient arrived with callus on the right fifth met head and the substantial hyper granulated callused wound on the base of his fifth metatarsal. He says he is completing his Levaquin in  2 days but I do not think that adds up with what I gave him but I will have to double check this. We are using Hydrofera Blue on both areas. My plan is to put the left leg in a cast the week after New Year's 04/06/2020; patient's wounds about the same. Right lateral fifth metatarsal head and left lateral foot over the base of the fifth metatarsal. There is undermining on the left lateral foot which I removed before application of total contact cast continuing with Hydrofera Blue new. Patient tells me he was seen by endocrinology today lab work was done [Dr. Kerr]. Also wondering whether he was referred to cardiology. I went over some lab work from previously does not have chronic renal failure certainly not nephrotic range proteinuria he does have very poorly controlled diabetes but this is not his most updated lab work. Hemoglobin A1c has been over 11 1/10; the patient had a considerable amount of leakage towards mid part of his left foot with macerated skin however the wound surface looks better the area on the right lateral fifth met head is better as well. I am going to change the dressing on the left foot under the total contact cast to silver alginate, continue with Hydrofera Blue on the right. 1/20; patient was in the total contact cast for 10 days. Considerable amount of drainage although the skin around the wound does not look too bad on the left foot. The area on the right  fifth metatarsal head is closed. Our nursing staff reports large amount of drainage out of the left lateral foot wound 1/25; continues with copious amounts of drainage described by our intake staff. PCR culture I did last week showed E. coli and Enterococcus faecalis and low quantities. Multiple resistance genes documented including extended spectrum beta lactamase, MRSA, MRSE, quinolone, tetracycline. The wound is not quite as good this week as it was 5 days ago but about the same size 2/3; continues with copious amounts of malodorous drainage per our intake nurse. The PCR culture I did 2 weeks ago showed E. coli and low quantities of Enterococcus. There were multiple resistance genes detected. I put Neosporin on him last week although this does not seem to have helped. The wound is slightly deeper today. Offloading continues to be an issue here although with the amount of drainage she has a total contact cast is just not going to work Psychologist, prison and probation services) Signed: 05/07/2020 4:57:15 PM By: Baltazar Najjar MD Entered By: Baltazar Najjar on 05/07/2020 08:28:20 -------------------------------------------------------------------------------- Physical Exam Details Patient Name: Date of Service: [name], [name] 05/07/2020 7:30 A M Medical Record Number: [id number] Patient Account Number: [id number] Date of Birth/Sex: Treating RN: 1986-03-04 (35 y.o. [name] Primary Care Provider: [name] Other Clinician: Referring Provider: Treating Provider/Extender: Gwyneth Revels in Treatment: 28 Constitutional Patient is hypertensive.. Pulse regular and within target range for patient.Marland Kitchen Respirations regular, non-labored and within target range.. Temperature is normal and within the target range for the patient.Marland Kitchen Appears in no distress. Cardiovascular Needle pulses are palpable. His lymphedema is controlled in the left leg. Notes Wound exam; left lateral fifth  metatarsal base. Tissue does not look as healthy this week although I do not think there is been much change in the wound perhaps slightly deeper. I used a #5 curette to take off subcutaneous tissue from the wound surface. Purpose of this was to reduce the bioburden. There is no palpable bone no surrounding soft tissue infection  Electronic Signature(s) Signed: 05/07/2020 4:57:15 PM By: [name] MD Entered By: [name] on 05/07/2020 08:29:50 -------------------------------------------------------------------------------- Physician Orders Details Patient Name: Date of Service: [name], [name] 05/07/2020 7:30 A M Medical Record Number: [id number] Patient Account Number: [id number] Date of Birth/Sex: Treating RN: 1985-07-27 (35 y.o. [name] Primary Care Provider: [name] Other Clinician: Referring Provider: Treating Provider/Extender: [name] in Treatment: 28 Verbal / Phone Orders: No Diagnosis Coding ICD-10 Coding Code Description E11.621 Type 2 diabetes mellitus with foot ulcer L97.528 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ity L97.518 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ity Follow-up Appointments Return Appointment in 1 week. Bathing/ Shower/ Hygiene May shower with protection but do not get wound dressing(s) wet. - may use cast protectors Edema Control - Lymphedema / SCD / Other Bilateral Lower Extremities Elevate legs to the level of the heart or above for 30 minutes daily and/or when sitting, a frequency of: - throughout the day Avoid standing for long periods of time. Exercise regularly Moisturize legs daily. - both legs every night before bed. Compression stocking or Garment 20-30 mm/Hg pressure to: - Patient to purchase compression stockings. apply to both legs in the morning and remove at night. Wound Treatment Wound #3 - Foot  Wound Laterality: Left, Lateral Cleanser: Soap and Water 1 x Per Day/30 Days Discharge Instructions: May shower and wash wound with dial antibacterial soap and water prior to dressing change. Peri-Wound Care: Zinc Oxide Ointment 30g tube 1 x Per Day/30 Days Discharge Instructions: Apply Zinc Oxide to periwound with each dressing change Peri-Wound Care: Sween Lotion (Moisturizing lotion) 1 x Per Day/30 Days Discharge Instructions: Apply moisturizing lotion as directed Topical: Triple Antibiotic Ointment, 1 (oz) Tube 1 x Per Day/30 Days Discharge Instructions: Apply under the alginate Ag until the keystone antibiotics arrive. Then apply keystone antibiotic to wound daily once arrives. Topical: keystone antibiotics 1 x Per Day/30 Days Discharge Instructions: apply topically once arrives to home. apply daily in place of Neosporin. Prim Dressing: KerraCel Ag Gelling Fiber Dressing, 4x5 in (silver alginate) (DME) (Generic) 1 x Per Day/30 Days ary Discharge Instructions: Apply silver alginate to wound bed as instructed Secondary Dressing: Woven Gauze Sponge, Non-Sterile 4x4 in (DME) (Generic) 1 x Per Day/30 Days Discharge Instructions: Apply over primary dressing as directed. Secondary Dressing: ABD Pad, 8x10 (DME) 1 x Per Day/30 Days Discharge Instructions: Apply over primary dressing as directed. Secured With: [employer], 4.5x3.1 (in/yd) (DME) (Generic) 1 x Per Day/30 Days Discharge Instructions: Secure with Kerlix as directed. Secured With: 46M Medipore H Soft Cloth Surgical T 4 x 2 (in/yd) (DME) (Generic) 1 x Per Day/30 Days ape Discharge Instructions: Secure dressing with tape as directed. Add-Ons: felt 1 x Per Day/30 Days Discharge Instructions: apply felt in shoe. Electronic Signature(s) Signed: 05/07/2020 4:57:15 PM By: [name] MD Signed: 05/07/2020 5:30:16 PM By: Deon Pilling Entered By: Deon Pilling on 05/07/2020  09:34:34 -------------------------------------------------------------------------------- Problem List Details Patient Name: Date of Service: [name], [name] 05/07/2020 7:30 A M Medical Record Number: [id number] Patient Account Number: [id number] Date of Birth/Sex: Treating RN: 1985-10-27 (36 y.o. 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, left ankle and foot 10/24/2019 10/24/2019 B95.62 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 10/24/2019 10/24/2019 classified elsewhere L97.518 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 He comes in for review of a wound over the plantar fifth metatarsal head extending into the lateral part of the foot. He was followed for this previously by his podiatrist Dr. Cornelius Moras. As the patient tells his story he went to see podiatry first for a swelling he developed on the lateral part of his fifth metatarsal head in May. He states this was "open" by podiatry and the area closed. He was followed up in June and it was again opened callus removed and it closed promptly. There were plans being made for surgery on the fifth metatarsal head in June however his blood sugar was apparently too high for anesthesia. Apparently the area was debrided and opened again in June and it is never closed since. Looking over the records from podiatry I am really not able to follow this. It was clear when he was first seen it was before 5/14 at that point he already had a wound. By 5/17 the ulcer was resolved. I do not see anything about a procedure. On 5/28 noted to have pre-ulcerative moderate keratosis. X-ray noted 1/5 contracted toe and tailor's bunion and metatarsal deformity. On a visit date on 09/28/2018 the dorsal part of the left foot it healed and resolved. There was concern about swelling in his lower extremity he was sent to the ER.. As far as I can tell he was seen in the ER on 7/12 with an ulcer on his left foot. A DVT rule out of the left leg was negative. I do not think I have complete records from podiatry but I am not able to verify the procedures this patient states he had. He states after the last procedure the wound has never closed although I am not able to follow this in the records I have from podiatry. He has not had a recent x-ray The patient has been using Neosporin on the wound. He is wearing a Darco shoe. He is still very active up on his foot working and exercising. Past medical history; type 2 diabetes ketosis-prone, leg swelling  with a negative DVT study in July. Non-smoker ABI in our clinic was 0.85 on the left 10/16; substantial wound on the plantar left fifth met head extending laterally almost to the dorsal fifth MTP. We have been using silver alginate we gave him a Darco forefoot off loader. An x-ray did not show evidence of osteomyelitis did note soft tissue emphysema which I think was due to gas tracking through an open wound. There is  no doubt in my mind he requires an MRI 10/23; MRI not booked until 3 November at the earliest this is largely due to his glucose sensor in the right arm. We have been using silver alginate. There has been an improvement 10/29; I am still not exactly sure when his MRI is booked for. He says it is the third but it is the 10th in epic. This definitely needs to be done. He is running a low-grade fever today but no other symptoms. No real improvement in the 1 02/26/2019 patient presents today for a follow-up visit here in our clinic he is last been seen in the clinic on October 29. Subsequently we were working on getting MRI to evaluate and see what exactly was going on and where we would need to go from the standpoint of whether or not he had osteomyelitis and again what treatments were going be required. Subsequently the patient ended up being admitted to the hospital on 02/07/2019 and was discharged on 02/14/2019. This is a somewhat interesting admission with a discharge diagnosis of pneumonia due to COVID-19 although he was positive for COVID-19 when tested at the urgent care but negative x2 when he was actually in the hospital. With that being said he did have acute respiratory failure with hypoxia and it was noted he also have a left foot ulceration with osteomyelitis. With that being said he did require oxygen for his pneumonia and I level 4 L. He was placed on antivirals and steroids for the COVID-19. He was also transferred to the [facility] at one point. Nonetheless he did  subsequently discharged home and since being home has done much better in that regard. The CT angiogram did not show any pulmonary embolism. With regard to the osteomyelitis the patient was placed on vancomycin and Zosyn while in the hospital but has been changed to Augmentin at discharge. It was also recommended that he follow- up with wound care and podiatry. Podiatry however wanted him to see [name] according to the patient prior to them doing anything further. His hemoglobin A1c was 9.9 as noted in the hospital. Have an MRI of the left foot performed while in the hospital on 02/04/2019. 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 There is an overlying plantar open wound noted an abscess tracking back along the lateral aspect of the fifth metatarsal shaft. There is otherwise diffuse cellulitis and mild fasciitis without findings of polymyositis. The patient did have recently pneumonia secondary to COVID-19 I looked in the chart through epic and it does appear that the patient may need to have an additional x-ray just to ensure everything is cleared and that he has no airspace disease prior to putting him into the [name]. 03/05/2019; patient was readmitted to the clinic last week. He was hospitalized twice for a viral upper respiratory tract infection from 11/1 through 11/4 and then 11/5 through 11/12 ultimately this turned out to be Covid pneumonitis. Although he was discharged on oxygen he is not using it. He says he feels fine. He has no exercise limitation no cough no sputum. His O2 sat in our clinic today was 100% on room air. 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 He received Vanco and Zosyn in the hospital and then was discharged on 2 weeks of Augmentin. I  do not see any relevant  cultures. He was supposed to follow-up with infectious disease but I do not see that he has an appointment. 12/8; patient saw Dr. Novella Olive of infectious disease last week. He felt that he had had adequate antibiotic therapy. He did not go to follow-up with Dr. Amalia Hailey of podiatry and I have again talked to him about the pros and cons of this. He does not want to consider a ray amputation of this time. He is aware of the risks of recurrence, migration etc. He started HBO today and tolerated this well. He can complete the Augmentin that I gave him last week. I have looked over the lab work that Dr. Chana Bode ordered his C-reactive protein was 3.3 and his sedimentation rate was 17. The C-reactive protein is never really been measurably that high in this patient 12/15; not much change in the wound today however he has undermining along the lateral part of the foot again more extensively than last week. He has some rims of epithelialization. We have been using silver alginate. He is undergoing hyperbarics but did not dive today 12/18; in for his obligatory first total contact cast change. Unfortunately there was pus coming from the undermining area around his fifth metatarsal head. This was cultured but will preclude reapplication of a cast. He is seen in conjunction with HBO 12/24; patient had staph lugdunensis in the wound in the undermining area laterally last time. We put him on doxycycline which should have covered this. The wound looks better today. I am going to give him another week of doxycycline before reattempting the total contact cast 12/31; the patient is completing antibiotics. Hemorrhagic debris in the distal part of the wound with some undermining distally. He also had hyper granulation. Extensive debridement with a #5 curette. The infected area that was on the lateral part of the fifth met head is closed over. I do not think he needs any more antibiotics. Patient was seen prior to HBO.  Preparations for a total contact cast were made in the cast will be placed post hyperbarics 04/11/19; once again the patient arrives today without complaint. He had been in a cast all week noted that he had heavy drainage this week. This resulted in large raised areas of macerated tissue around the wound 1/14; wound bed looks better slightly smaller. Hydrofera Blue has been changing himself. He had a heavy drainage last week which caused a lot of maceration around the wound so I took him out of a total contact cast he says the drainage is actually better this week He is seen today in conjunction with HBO 1/21; returns to clinic. He was up in [place] for a day or 2 attending a funeral. He comes back in with the wound larger and with a large area of exposed bone. He had osteomyelitis and septic arthritis of the fifth left metatarsal head while he was in hospital. He received IV antibiotics in the hospital for a prolonged period of time then 3 weeks of Augmentin. Subsequently I gave him 2 weeks of doxycycline for more superficial wound infection. When I saw this last week the wound was smaller the surface of the wound looks satisfactory. 1/28; patient missed hyperbarics today. Bone biopsy I did last time showed Enterococcus faecalis and Staphylococcus lugdunensis . He has a wide area of exposed bone. We are going to use silver alginate as of today. I had another ethical discussion with the patient. This would be recurrent osteomyelitis he is already  received IV antibiotics. In this situation I think the likelihood of healing this is low. Therefore I have recommended a ray amputation and with the patient's agreement I have referred him to Dr. Doran Durand. The other issue is that his compliance with hyperbarics has been minimal because of his work schedule and given his underlying decision I am going to stop this today READMISSION 10/24/2019 MRI 09/29/2019 left foot IMPRESSION: 1. Apparent skin ulceration  inferior and lateral to the 5th metatarsal base with underlying heterogeneous T2 signal and enhancement in the subcutaneous fat. Small peripherally enhancing fluid collections along the plantar and lateral aspects of the 5th metatarsal base suspicious for abscesses. 2. Interval amputation through the mid 5th metatarsal with nonspecific low-level marrow edema and enhancement. Given the proximity to the adjacent soft tissue inflammatory changes, osteomyelitis cannot be excluded. 3. The additional bones appear unremarkable. MRI 09/29/2019 right foot IMPRESSION: 1. Soft tissue ulceration lateral to the 5th MTP joint. There is low-level T2 hyperintensity within the 4th and 5th metatarsal heads and adjacent proximal phalanges without abnormal T1 signal or cortical destruction. These findings are nonspecific and could be seen with early marrow edema, hyperemia or early osteomyelitis. No evidence of septic joint. 2. Mild tenosynovitis and synovial enhancement associated with the extensor digitorum tendons at the level of the midfoot. 3. Diffuse low-level muscular T2 hyperintensity and enhancement, most consistent with diabetic myopathy. LEFT FOOT BONE Methicillin resistant staphylococcus aureus Staphylococcus lugdunensis MIC MIC CIPROFLOXACIN >=8 RESISTANT Resistant <=0.5 SENSI... Sensitive CLINDAMYCIN <=0.25 SENS... Sensitive >=8 RESISTANT Resistant ERYTHROMYCIN >=8 RESISTANT Resistant >=8 RESISTANT Resistant GENTAMICIN <=0.5 SENSI... Sensitive <=0.5 SENSI... Sensitive Inducible Clindamycin NEGATIVE Sensitive NEGATIVE Sensitive OXACILLIN >=4 RESISTANT Resistant 2 SENSITIVE Sensitive RIFAMPIN <=0.5 SENSI... Sensitive <=0.5 SENSI... Sensitive TETRACYCLINE <=1 SENSITIVE Sensitive <=1 SENSITIVE Sensitive TRIMETH/SULFA <=10 SENSIT Sensitive <=10 SENSIT Sensitive ... Marland Kitchen.. VANCOMYCIN 1 SENSITIVE Sensitive <=0.5 SENSI... Sensitive Right foot bone . Component 3 wk ago Specimen Description  BONE Special Requests RIGHT 4 METATARSAL SAMPLE B Gram Stain NO WBC SEEN NO ORGANISMS SEEN Culture RARE METHICILLIN RESISTANT STAPHYLOCOCCUS AUREUS NO ANAEROBES ISOLATED Performed at [facility], [place] [address], [place], [place] [id number] Report Status 10/08/2019 FINAL Organism ID, Bacteria METHICILLIN RESISTANT STAPHYLOCOCCUS AUREUS Resulting Agency [facility] Susceptibility Methicillin resistant staphylococcus aureus MIC CIPROFLOXACIN >=8 RESISTANT Resistant CLINDAMYCIN <=0.25 SENS... Sensitive ERYTHROMYCIN >=8 RESISTANT Resistant GENTAMICIN <=0.5 SENSI... Sensitive Inducible Clindamycin NEGATIVE Sensitive OXACILLIN >=4 RESISTANT Resistant RIFAMPIN <=0.5 SENSI... Sensitive TETRACYCLINE <=1 SENSITIVE Sensitive TRIMETH/SULFA <=10 SENSIT Sensitive ... VANCOMYCIN 1 SENSITIVE Sensitive This is a patient we had in clinic earlier this year with a wound over his left fifth metatarsal head. 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 Doran Durand on 05/16/2019. He seems to have developed problems with wounds on his bilateral feet in June 2021 although he may have had problems earlier than this. He was in an urgent care with a right foot ulcer on 09/26/2019 and given a course of doxycycline. This was apparently after having trouble getting into see orthopedics. He was seen by podiatry on 09/28/2019 noted to have bilateral lower extremity ulcers including the left lateral fifth metatarsal base and the right subfifth met head. It was noted that had purulent drainage at that time. He required hospitalization from  6/20 through 7/2. This was because of worsening right foot wounds. He underwent bilateral operative incision  and drainage and bone biopsies bilaterally. Culture results are listed above. He has been referred back to clinic by Dr. Jacqualyn Posey of podiatry. He is also followed by Dr. Megan Salon who saw him yesterday. He was discharged from hospital on Zyvox Flagyl and Levaquin and yesterday changed to doxycycline Flagyl and Levaquin. His inflammatory markers on 6/26 showed a sedimentation rate of 129 and a C-reactive protein of 5. This is improved to 14 and 1.3 respectively. This would indicate improvement. ABIs in our clinic today were 1.23 on the right and 1.20 on the left 11/01/2019 on evaluation today patient appears to be doing fairly well in regard to the wounds on his feet at this point. Fortunately there is no signs of active infection at this time. No fevers, chills, nausea, vomiting, or diarrhea. He currently is seeing infectious disease and still under their care at this point. Subsequently he also has both wounds which she has not been using collagen on as he did not receive that in his packaging he did not call us and let us know that. Apparently that just was missed on the order. Nonetheless we will get that straightened out today. 8/9-Patient returns for bilateral foot wounds, using Prisma with hydrogel moistened dressings, and the wounds appear stable. Patient using surgical shoes, avoiding much pressure or weightbearing as much as possible 8/16; patient has bilateral foot wounds. 1 on the right lateral foot proximally the other is on the left mid lateral foot. Both required debridement of callus and thick skin around the wounds. We have been using silver collagen 8/27; patient has bilateral lateral foot wounds. The area on the left substantially surrounded by callus and dry skin. This was removed from the wound edge. The underlying wound is small. The area on the right measured somewhat smaller today. We've been using silver collagen the patient was on antibiotics for underlying osteomyelitis in the  left foot. Unfortunately I did not update his antibiotics during today's visit. 9/10 I reviewed Dr. Hale Bogus last notes he felt he had completed antibiotics his inflammatory markers were reasonably well controlled. He has a small wound on the lateral left foot and a tiny area on the right which is just above closed. He is using Hydrofera Blue with border foam he has bilateral surgical shoes 9/24; 2 week f/u. doing well. right foot is closed. left foot still undermined. 10/14; right foot remains closed at the fifth met head. The area over the base of the left fifth metatarsal has a small open area but considerable undermining towards the plantar foot. Thick callus skin around this suggests an adequate pressure relief. We have talked about this. He says he is going to go back into his cam boot. I suggested a total contact cast he did not seem enamored with this suggestion 10/26; left foot base of the fifth metatarsal. Same condition as last time. He has skin over the area with an open wound however the skin is not adherent. He went to see Dr. Earleen Newport who did an x-ray and culture of his foot I have not reviewed the x-ray but the patient was not told anything. He is on doxycycline 11/11; since the patient was last here he was in the emergency room on 10/30 he was concerned about swelling in the left foot. They did not do any cultures or x-rays. They changed his antibiotics to cephalexin. Previous culture showed group B strep.  The cephalexin is appropriate as doxycycline has less than predictable coverage. Arrives in clinic today with swelling over this area under the wound. He also has a new wound on the right fifth metatarsal head 11/18; the patient has a difficult wound on the lateral aspect of the left fifth metatarsal head. The wound was almost ballotable last week I opened it slightly expecting to see purulence however there was just bleeding. I cultured this this was negative. X-ray unchanged. We are  trying to get an MRI but I am not sure were going to be able to get this through his insurance. He also has an area on the right lateral fifth metatarsal head this looks healthier 12/3; the patient finally got our MRI. Surprisingly this did not show osteomyelitis. I did show the soft tissue ulceration at the lateral plantar aspect of the fifth metatarsal base with a tiny residual 6 mm abscess overlying the superficial fascia I have tried to culture this area I have not been able to get this to grow anything. Nevertheless the protruding tissue looks aggravated. I suspect we should try to treat the underlying "abscess with broad-spectrum antibiotics. I am going to start him on Levaquin and Flagyl. He has much less edema in his legs and I am going to continue to wrap his legs and see him weekly 12/10. I started Levaquin and Flagyl on him last week. He just picked up the Flagyl apparently there was some delay. The worry is the wound on the left fifth metatarsal base which is substantial and worsening. His foot looks like he inverts at the ankle making this a weightbearing surface. Certainly no improvement in fact I think the measurements of this are somewhat worse. We have been using 12/17; he apparently just got the Levaquin yesterday this is 2 weeks after the fact. He has completed the Flagyl. The area over the left fifth metatarsal base still has protruding granulation tissue although it does not look quite as bad as it did some weeks ago. He has severe bilateral lymphedema although we have not been treating him for wounds on his legs this is definitely going to require compression. There was so much edema in the left I did not wish to put him in a total contact cast today. I am going to increase his compression from 3-4 layer. The area on the right lateral fifth met head actually look quite good and superficial. 12/23; patient arrived with callus on the right fifth met head and the substantial hyper  granulated callused wound on the base of his fifth metatarsal. He says he is completing his Levaquin in 2 days but I do not think that adds up with what I gave him but I will have to double check this. We are using Hydrofera Blue on both areas. My plan is to put the left leg in a cast the week after New Year's 04/06/2020; patient's wounds about the same. Right lateral fifth metatarsal head and left lateral foot over the base of the fifth metatarsal. There is undermining on the left lateral foot which I removed before application of total contact cast continuing with Hydrofera Blue new. Patient tells me he was seen by endocrinology today lab work was done [Dr. Kerr]. Also wondering whether he was referred to cardiology. I went over some lab work from previously does not have chronic renal failure certainly not nephrotic range proteinuria he does have very poorly controlled diabetes but this is not his most updated lab work. Hemoglobin A1c  has been over 11 1/10; the patient had a considerable amount of leakage towards mid part of his left foot with macerated skin however the wound surface looks better the area on the right lateral fifth met head is better as well. I am going to change the dressing on the left foot under the total contact cast to silver alginate, continue with Hydrofera Blue on the right. 1/20; patient was in the total contact cast for 10 days. Considerable amount of drainage although the skin around the wound does not look too bad on the left foot. The area on the right fifth metatarsal head is closed. Our nursing staff reports large amount of drainage out of the left lateral foot wound 1/25; continues with copious amounts of drainage described by our intake staff. PCR culture I did last week showed E. coli and Enterococcus faecalis and low quantities. Multiple resistance genes documented including extended spectrum beta lactamase, MRSA, MRSE, quinolone, tetracycline. The wound is not  quite as good this week as it was 5 days ago but about the same size 2/3; continues with copious amounts of malodorous drainage per our intake nurse. The PCR culture I did 2 weeks ago showed E. coli and low quantities of Enterococcus. There were multiple resistance genes detected. I put Neosporin on him last week although this does not seem to have helped. The wound is slightly deeper today. Offloading continues to be an issue here although with the amount of drainage she has a total contact cast is just not going to work Objective Constitutional Patient is hypertensive.. Pulse regular and within target range for patient.Marland Kitchen Respirations regular, non-labored and within target range.. Temperature is normal and within the target range for the patient.Marland Kitchen Appears in no distress. Vitals Time Taken: 7:53 AM, Height: 77 in, Source: Stated, Weight: 280 lbs, Source: Stated, BMI: 33.2, Temperature: 98.6 F, Pulse: 90 bpm, Respiratory Rate: 18 breaths/min, Blood Pressure: 142/87 mmHg, Capillary Blood Glucose: 109 mg/dl. General Notes: glucose per pt report this am Cardiovascular Needle pulses are palpable. His lymphedema is controlled in the left leg. General Notes: Wound exam; left lateral fifth metatarsal base. Tissue does not look as healthy this week although I do not think there is been much change in the wound perhaps slightly deeper. I used a #5 curette to take off subcutaneous tissue from the wound surface. Purpose of this was to reduce the bioburden. There is no palpable bone no surrounding soft tissue infection Integumentary (Hair, Skin) Wound #3 status is Open. Original cause of wound was Trauma. The wound is located on the Left,Lateral Foot. The wound measures 2.6cm length x 3.3cm width x 1.1cm depth; 6.739cm^2 area and 7.413cm^3 volume. There is Fat Layer (Subcutaneous Tissue) exposed. There is no tunneling noted, however, there is undermining starting at 3:00 and ending at 6:00 with a maximum  distance of 0.6cm. There is a medium amount of serosanguineous drainage noted. The wound margin is thickened. There is large (67-100%) red granulation within the wound bed. There is no necrotic tissue within the wound bed. 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 There was a Excisional Skin/Subcutaneous Tissue Debridement with a total area of 10.5 sq cm performed by [name], MD. With the following  instrument(s): Curette to remove Viable and Non-Viable tissue/material. Material removed includes Callus, Subcutaneous Tissue, Slough, Skin: Dermis, Skin: Epidermis, and Fibrin/Exudate after achieving pain control using Lidocaine 4% T opical Solution. A time out was conducted at 08:10, prior to the start of the procedure. A Moderate amount of bleeding was controlled with Pressure. The procedure was tolerated well with a pain level of 0 throughout and a pain level of 0 following the procedure. Post Debridement Measurements: 2.6cm length x 3.3cm width x 1cm depth; 6.739cm^3 volume. Character of Wound/Ulcer Post Debridement is improved. Severity of Tissue Post Debridement is: Fat layer exposed. Post procedure Diagnosis Wound #3: Same as Pre-Procedure Plan Follow-up Appointments: Return Appointment in 1 week. Bathing/ Shower/ Hygiene: May shower with protection but do not get wound dressing(s) wet. - may use cast protectors Edema Control - Lymphedema / SCD / Other: Elevate legs to the level of the heart or above for 30 minutes daily and/or when sitting, a frequency of: - throughout the day Avoid standing for long periods of time. Exercise regularly Moisturize legs daily. - both  legs every night before bed. Compression stocking or Garment 20-30 mm/Hg pressure to: - Patient to purchase compression stockings. apply to both legs in the morning and remove at night. WOUND #3: - Foot Wound Laterality: Left, Lateral Cleanser: Soap and Water 1 x Per Day/30 Days Discharge Instructions: May shower and wash wound with dial antibacterial soap and water prior to dressing change. Peri-Wound Care: Zinc Oxide Ointment 30g tube 1 x Per Day/30 Days Discharge Instructions: Apply Zinc Oxide to periwound with each dressing change Peri-Wound Care: Sween Lotion (Moisturizing lotion) 1 x Per Day/30 Days Discharge Instructions: Apply moisturizing lotion as directed Topical: Triple Antibiotic Ointment, 1 (oz) Tube 1 x Per Day/30 Days Discharge Instructions: Apply under the alginate Ag until the keystone antibiotics arrive. Then apply keystone antibiotic to wound daily once arrives. Prim Dressing: KerraCel Ag Gelling Fiber Dressing, 4x5 in (silver alginate) (DME) (Generic) 1 x Per Day/30 Days ary Discharge Instructions: Apply silver alginate to wound bed as instructed Secondary Dressing: Woven Gauze Sponge, Non-Sterile 4x4 in (DME) (Generic) 1 x Per Day/30 Days Discharge Instructions: Apply over primary dressing as directed. Secondary Dressing: ABD Pad, 8x10 (DME) 1 x Per Day/30 Days Discharge Instructions: Apply over primary dressing as directed. Secured With: [employer], 4.5x3.1 (in/yd) (DME) (Generic) 1 x Per Day/30 Days Discharge Instructions: Secure with Kerlix as directed. Secured With: 41M Medipore H Soft Cloth Surgical T 4 x 2 (in/yd) (DME) (Generic) 1 x Per Day/30 Days ape Discharge Instructions: Secure dressing with tape as directed. Add-Ons: felt 1 x Per Day/30 Days Discharge Instructions: apply felt in shoe. 1. Aggressive debridement 2. I am going to try to get a compounded antibiotic this is going to be complicated given the multiresistance genes detected 3. I would  like to try oral combination therapy based on the PCR culture but I will have to see about this later in the day as we cannot find the culture result 4. I am going to get the patient to get compression stockings. This will be so he can wash the wound and apply the topical antibiotic daily. Were going to give him his measurements today. 5. He really is not offloading this still working and while I understand this we do not have a lot of options for a wound in this area. T contact casting is not otal going to work until we can control the Human resources officer) Signed: 05/07/2020 4:57:15  PM By: [name] MD Entered By: [name] on 05/07/2020 08:31:10 -------------------------------------------------------------------------------- SuperBill Details Patient Name: Date of Service: [name], [name] 05/07/2020 Medical Record Number: [id number] Patient Account Number: [id number] Date of Birth/Sex: Treating RN: 12-15-1985 (35 y.o. [name] Primary Care Provider: [name] Other Clinician: Referring Provider: Treating Provider/Extender: [name] in Treatment: 28 Diagnosis Coding ICD-10 Codes Code Description E11.621 Type 2 diabetes mellitus with foot ulcer L97.528 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ity Facility Procedures CPT4 Code: [id number] Description: 17494 - DEB SUBQ TISSUE 20 SQ CM/< ICD-10 Diagnosis Description L97.528 Non-pressure chronic ulcer of other part of left foot with other specified seve Modifier: rity Quantity: 1 Physician Procedures : CPT4 Code Description Modifier [id number] - WC PHYS SUBQ TISS 20 SQ CM ICD-10 Diagnosis Description L97.528 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ity Quantity: 1 Electronic Signature(s) Signed: 05/07/2020 4:57:15 PM By: [name] MD Entered By: [name] on 05/07/2020 08:31:23

## 2020-05-08 NOTE — Progress Notes (Signed)
[name], [name] ([id number]) Visit Report for 05/07/2020 Arrival Information Details Patient Name: Date of Service: [name] 05/07/2020 7:30 A M Medical Record Number: [id number] Patient Account Number: [id number] Date of Birth/Sex: Treating RN: May 16, 1985 (35 y.o. [name] Primary Care Ronn Smolinsky: [name] Other Clinician: Referring Kayin Kettering: Treating Dorothie Wah/Extender: [name] in Treatment: 7 Visit Information History Since Last Visit Added or deleted any medications: No Patient Arrived: Ambulatory Any new allergies or adverse reactions: No Arrival Time: 07:53 Had a fall or experienced change in No Accompanied By: self activities of daily living that may affect Transfer Assistance: None risk of falls: Patient Identification Verified: Yes Signs or symptoms of abuse/neglect since last visito No Secondary Verification Process Completed: Yes Hospitalized since last visit: No Patient Requires Transmission-Based Precautions: No Implantable device outside of the clinic excluding No Patient Has Alerts: No cellular tissue based products placed in the center since last visit: Has Dressing in Place as Prescribed: Yes Has Footwear/Offloading in Place as Prescribed: Yes Left: Wedge Shoe Pain Present Now: No Electronic Signature(s) Signed: 05/07/2020 5:53:40 PM By: Baruch Gouty RN, BSN Entered By: Baruch Gouty on 05/07/2020 07:53:42 -------------------------------------------------------------------------------- Encounter Discharge Information Details Patient Name: Date of Service: [name], [name] 05/07/2020 7:30 A M Medical Record Number: [id number] Patient Account Number: [id number] Date of Birth/Sex: Treating RN: 1985-11-20 (35 y.o. [name] Primary Care Prudie Guthridge: [name] Other Clinician: Referring Branndon Tuite: Treating Enrigue Hashimi/Extender: [name] in Treatment: 28 Encounter Discharge  Information Items Post Procedure Vitals Discharge Condition: Stable Temperature (F): 98.6 Ambulatory Status: Ambulatory Pulse (bpm): 90 Discharge Destination: Home Respiratory Rate (breaths/min): 18 Transportation: Private Auto Blood Pressure (mmHg): 142/87 Accompanied By: alone Schedule Follow-up Appointment: Yes Clinical Summary of Care: Patient Declined Electronic Signature(s) Signed: 05/08/2020 4:51:44 PM By: [name] RN, BSN Entered By: [name] on 05/07/2020 09:23:11 -------------------------------------------------------------------------------- Lower Extremity Assessment Details Patient Name: Date of Service: [name], [name] 05/07/2020 7:30 A M Medical Record Number: [id number] Patient Account Number: [id number] Date of Birth/Sex: Treating RN: 18-Apr-1985 (35 y.o. [name] Primary Care Xochilth Standish: [name] Other Clinician: Referring Phylisha Dix: Treating Norvell Caswell/Extender: [name] in Treatment: 28 Edema Assessment Assessed: [name]: No] [name]: No] Edema: [Left: Ye] [Right: s] Calf Left: Right: Point of Measurement: 48 cm From Medial Instep 46.4 cm Ankle Left: Right: Point of Measurement: 10 cm From Medial Instep 30 cm Vascular Assessment Pulses: Dorsalis Pedis Palpable: [Left:Yes] Electronic Signature(s) Signed: 05/07/2020 5:53:40 PM By: Baruch Gouty RN, BSN Entered By: Baruch Gouty on 05/07/2020 08:01:30 -------------------------------------------------------------------------------- Multi Wound Chart Details Patient Name: Date of Service: [name], [name] 05/07/2020 7:30 A M Medical Record Number: [id number] Patient Account Number: [id number] Date of Birth/Sex: Treating RN: [birth year] (35 y.o. [name] Primary Care Meli Faley: [name] Other Clinician: Referring Valentina Alcoser: Treating Marquee Fuchs/Extender: [name] in Treatment: 28 Vital Signs Height(in):  77 Capillary Blood Glucose(mg/dl): 109 Weight(lbs): 280 Pulse(bpm): 90 Body Mass Index(BMI): 33 Blood Pressure(mmHg): 142/87 Temperature(F): 98.6 Respiratory Rate(breaths/min): 18 Photos: [3:No Photos Left, Lateral Foot] [N/A:N/A N/A] Wound Location: [3:Trauma] [N/A:N/A] Wounding Event: [3:Diabetic Wound/Ulcer of the Lower] [N/A:N/A] Primary Etiology: [3:Extremity Type II Diabetes] [N/A:N/A] Comorbid History: [3:10/02/2019] [N/A:N/A] Date Acquired: [3:28] [N/A:N/A] Weeks of Treatment: [3:Open] [N/A:N/A] Wound Status: [3:2.6x3.3x1.1] [N/A:N/A] Measurements L x W x [name] [3:6.739] [N/A:N/A] A (cm) : rea [3:7.413] [N/A:N/A] Volume (cm) : [3:-308.70%] [N/A:N/A] % Reduction in A rea: [3:-4392.70%] [N/A:N/A] % Reduction in Volume: [  3:3] Starting Position 1 (o'clock): [3:6] Ending Position 1 (o'clock): [3:0.6] Maximum Distance 1 (cm): [3:Yes] [N/A:N/A] Undermining: [3:Grade 2] [N/A:N/A] Classification: [3:Medium] [N/A:N/A] Exudate A mount: [3:Serosanguineous] [N/A:N/A] Exudate Type: [3:red, brown] [N/A:N/A] Exudate Color: [3:Thickened] [N/A:N/A] Wound Margin: [3:Large (67-100%)] [N/A:N/A] Granulation A mount: [3:Red] [N/A:N/A] Granulation Quality: [3:None Present (0%)] [N/A:N/A] Necrotic A mount: [3:Fat Layer (Subcutaneous Tissue): Yes N/A] Exposed Structures: [3:Fascia: No Tendon: No Muscle: No Joint: No Bone: No None] [N/A:N/A] Epithelialization: [3:Debridement - Excisional] [N/A:N/A] Debridement: Pre-procedure Verification/Time Out 08:10 [N/A:N/A] Taken: [3:Lidocaine 4% Topical Solution] [N/A:N/A] Pain Control: [3:Callus, Subcutaneous, Slough] [N/A:N/A] Tissue Debrided: [3:Skin/Subcutaneous Tissue] [N/A:N/A] Level: [3:10.5] [N/A:N/A] Debridement A (sq cm): [3:rea Curette] [N/A:N/A] Instrument: [3:Moderate] [N/A:N/A] Bleeding: [3:Pressure] [N/A:N/A] Hemostasis A chieved: [3:0] [N/A:N/A] Procedural Pain: [3:0] [N/A:N/A] Post Procedural Pain: [3:Procedure was tolerated  well] [N/A:N/A] Debridement Treatment Response: [3:2.6x3.3x1] [N/A:N/A] Post Debridement Measurements L x W x [name] [3:6.739] [N/A:N/A] Post Debridement Volume: (cm) [3:Debridement] [N/A:N/A] Treatment Notes Electronic Signature(s) Signed: 05/07/2020 4:57:15 PM By: [name] MD Signed: 05/07/2020 5:30:16 PM By: Deon Pilling Entered By: [name] on 05/07/2020 08:26:51 -------------------------------------------------------------------------------- Multi-Disciplinary Care Plan Details Patient Name: Date of Service: [name], [name] 05/07/2020 7:30 A M Medical Record Number: [id number] Patient Account Number: [id number] Date of Birth/Sex: Treating RN: 1986-01-31 (35 y.o. [name] Primary Care Qunisha Bryk: [name] Other Clinician: Referring Nayely Dingus: Treating Tewana Bohlen/Extender: [name] in Treatment: 28 Active Inactive Nutrition Nursing Diagnoses: Imbalanced nutrition Potential for alteratiion in Nutrition/Potential for imbalanced nutrition Goals: Patient/caregiver agrees to and verbalizes understanding of need to use nutritional supplements and/or vitamins as prescribed Date Initiated: 10/24/2019 Date Inactivated: 04/06/2020 Target Resolution Date: 04/03/2020 Goal Status: Met Patient/caregiver will maintain therapeutic glucose control Date Initiated: 10/24/2019 Target Resolution Date: 06/05/2020 Goal Status: Active Interventions: Assess HgA1c results as ordered upon admission and as needed Assess patient nutrition upon admission and as needed per policy Provide education on elevated blood sugars and impact on wound healing Provide education on nutrition Treatment Activities: Education provided on Nutrition : 04/23/2020 Notes: Wound/Skin Impairment Nursing Diagnoses: Impaired tissue integrity Knowledge deficit related to ulceration/compromised skin integrity Goals: Patient/caregiver will verbalize understanding of skin care  regimen Date Initiated: 10/24/2019 Target Resolution Date: 06/06/2020 Goal Status: Active Ulcer/skin breakdown will have a volume reduction of 30% by week 4 Date Initiated: 10/24/2019 Date Inactivated: 01/16/2020 Target Resolution Date: 01/10/2020 Unmet Reason: no change in Goal Status: Unmet measurements. Interventions: Assess patient/caregiver ability to obtain necessary supplies Assess patient/caregiver ability to perform ulcer/skin care regimen upon admission and as needed Assess ulceration(s) every visit Provide education on ulcer and skin care Notes: Electronic Signature(s) Signed: 05/07/2020 5:30:16 PM By: Deon Pilling Entered By: Deon Pilling on 05/07/2020 08:03:07 -------------------------------------------------------------------------------- Pain Assessment Details Patient Name: Date of Service: [name] 05/07/2020 7:30 A M Medical Record Number: [id number] Patient Account Number: [id number] Date of Birth/Sex: Treating RN: 08/06/1985 (35 y.o. [name] Primary Care Isiah Scheel: [name] Other Clinician: Referring Khushboo Chuck: Treating Nehal Witting/Extender: [name] in Treatment: 28 Active Problems Location of Pain Severity and Description of Pain Patient Has Paino No Site Locations Rate the pain. Rate the pain. Current Pain Level: 0 Pain Management and Medication Current Pain Management: Electronic Signature(s) Signed: 05/07/2020 5:53:40 PM By: Baruch Gouty RN, BSN Entered By: Baruch Gouty on 05/07/2020 07:54:43 -------------------------------------------------------------------------------- Patient/Caregiver Education Details Patient Name: Date of Service: [name], [name] 2/3/2022andnbsp7:30 A M Medical Record Number: [id number] Patient Account Number: [id number] Date of Birth/Gender: Treating  RN: [birth year] (35 y.o. [name] Primary Care Physician: [name] Other Clinician: Referring  Physician: Treating Physician/Extender: [name] in Treatment: 28 Education Assessment Education Provided To: Patient Education Topics Provided Elevated Blood Sugar/ Impact on Healing: Handouts: Elevated Blood Sugars: How Do They Affect Wound Healing Methods: Explain/Verbal Responses: Reinforcements needed Electronic Signature(s) Signed: 05/07/2020 5:30:16 PM By: Deon Pilling Entered By: Deon Pilling on 05/07/2020 08:03:59 -------------------------------------------------------------------------------- Wound Assessment Details Patient Name: Date of Service: [name] 05/07/2020 7:30 A M Medical Record Number: [id number] Patient Account Number: [id number] Date of Birth/Sex: Treating RN: 08-Apr-1985 (35 y.o. [name] Primary Care Raliyah Montella: [name] Other Clinician: Referring Samantha Ragen: Treating Makenzy Krist/Extender: [name] in Treatment: 28 Wound Status Wound Number: 3 Primary Etiology: Diabetic Wound/Ulcer of the Lower Extremity Wound Location: Left, Lateral Foot Wound Status: Open Wounding Event: Trauma Comorbid History: Type II Diabetes Date Acquired: 10/02/2019 Weeks Of Treatment: 28 Clustered Wound: No Wound Measurements Length: (cm) 2.6 Width: (cm) 3.3 Depth: (cm) 1.1 Area: (cm) 6.739 Volume: (cm) 7.413 % Reduction in Area: -308.7% % Reduction in Volume: -4392.7% Epithelialization: None Tunneling: No Undermining: Yes Starting Position (o'clock): 3 Ending Position (o'clock): 6 Maximum Distance: (cm) 0.6 Wound Description Classification: Grade 2 Wound Margin: Thickened Exudate Amount: Medium Exudate Type: Serosanguineous Exudate Color: red, brown Foul Odor After Cleansing: No Slough/Fibrino No Wound Bed Granulation Amount: Large (67-100%) Exposed Structure Granulation Quality: Red Fascia Exposed: No Necrotic Amount: None Present (0%) Fat Layer (Subcutaneous Tissue) Exposed:  Yes Tendon Exposed: No Muscle Exposed: No Joint Exposed: No Bone Exposed: No Treatment Notes Wound #3 (Foot) Wound Laterality: Left, Lateral Cleanser Soap and Water Discharge Instruction: May shower and wash wound with dial antibacterial soap and water prior to dressing change. Peri-Wound Care Zinc Oxide Ointment 30g tube Discharge Instruction: Apply Zinc Oxide to periwound with each dressing change Sween Lotion (Moisturizing lotion) Discharge Instruction: Apply moisturizing lotion as directed Topical Triple Antibiotic Ointment, 1 (oz) Tube Discharge Instruction: Apply under the alginate Ag until the keystone antibiotics arrive. Then apply keystone antibiotic to wound daily once arrives. Primary Dressing KerraCel Ag Gelling Fiber Dressing, 4x5 in (silver alginate) Discharge Instruction: Apply silver alginate to wound bed as instructed Secondary Dressing Woven Gauze Sponge, Non-Sterile 4x4 in Discharge Instruction: Apply over primary dressing as directed. ABD Pad, 8x10 Discharge Instruction: Apply over primary dressing as directed. Secured With [employer], 4.5x3.1 (in/yd) Discharge Instruction: Secure with Kerlix as directed. 85M Medipore H Soft Cloth Surgical T 4 x 2 (in/yd) ape Discharge Instruction: Secure dressing with tape as directed. Compression Wrap Compression Stockings Add-Ons felt Discharge Instruction: apply felt in shoe. Electronic Signature(s) Signed: 05/07/2020 5:53:40 PM By: Baruch Gouty RN, BSN Entered By: Baruch Gouty on 05/07/2020 08:06:49 -------------------------------------------------------------------------------- [place] Details Patient Name: Date of Service: [name], [name] 05/07/2020 7:30 A M Medical Record Number: [id number] Patient Account Number: [id number] Date of Birth/Sex: Treating RN: 1985-08-26 (35 y.o. [name] Primary Care Kaylla Cobos: [name] Other Clinician: Referring Danean Marner: Treating  Jw Covin/Extender: [name] in Treatment: 28 Vital Signs Time Taken: 07:53 Temperature (F): 98.6 Height (in): 77 Pulse (bpm): 90 Source: Stated Respiratory Rate (breaths/min): 18 Weight (lbs): 280 Blood Pressure (mmHg): 142/87 Source: Stated Capillary Blood Glucose (mg/dl): 109 Body Mass Index (BMI): 33.2 Reference Range: 80 - 120 mg / dl Notes glucose per pt report this am Electronic Signature(s) Signed: 05/07/2020 5:53:40 PM By: Baruch Gouty RN, BSN Entered By: Baruch Gouty  on 05/07/2020 07:54:34

## 2020-05-12 NOTE — Progress Notes (Signed)
[name], [name] ([id number]) Visit Report for 04/23/2020 Arrival Information Details Patient Name: Date of Service: [name] 04/23/2020 8:15 A M Medical Record Number: [id number] Patient Account Number: [id number] Date of Birth/Sex: Treating RN: 06/14/1985 (35 y.o. [name], Meta.Reding Primary Care Weslynn Ke: [name] Other Clinician: Referring Stormi Vandevelde: Treating Kushi Kun/Extender: [name] in Treatment: 44 Visit Information History Since Last Visit Added or deleted any medications: No Patient Arrived: Ambulatory Any new allergies or adverse reactions: No Arrival Time: 08:23 Had a fall or experienced change in No Accompanied By: self activities of daily living that may affect Transfer Assistance: None risk of falls: Patient Identification Verified: Yes Signs or symptoms of abuse/neglect since last visito No Secondary Verification Process Completed: Yes Hospitalized since last visit: No Patient Requires Transmission-Based Precautions: No Implantable device outside of the clinic excluding No Patient Has Alerts: No cellular tissue based products placed in the center since last visit: Has Dressing in Place as Prescribed: Yes Pain Present Now: No Electronic Signature(s) Signed: 04/23/2020 2:45:22 PM By: Sandre Kitty Entered By: Sandre Kitty on 04/23/2020 08:23:57 -------------------------------------------------------------------------------- Encounter Discharge Information Details Patient Name: Date of Service: [name], [name] 04/23/2020 8:15 A M Medical Record Number: [id number] Patient Account Number: [id number] Date of Birth/Sex: Treating RN: [birth year] (35 y.o. [name] Primary Care Beaulah Romanek: [name] Other Clinician: Referring Kurstin Dimarzo: Treating Jarika Robben/Extender: [name] in Treatment: 26 Encounter Discharge Information Items Discharge Condition: Stable Ambulatory Status:  Ambulatory Discharge Destination: Home Transportation: Private Auto Accompanied By: self Schedule Follow-up Appointment: Yes Clinical Summary of Care: Patient Declined Electronic Signature(s) Signed: 04/23/2020 4:58:08 PM By: Baruch Gouty RN, BSN Entered By: Baruch Gouty on 04/23/2020 10:02:03 -------------------------------------------------------------------------------- Lower Extremity Assessment Details Patient Name: Date of Service: [name], [name] 04/23/2020 8:15 A M Medical Record Number: [id number] Patient Account Number: [id number] Date of Birth/Sex: Treating RN: 11-25-1985 (35 y.o. [name] Primary Care Dionysios Massman: [name] Other Clinician: Referring Jazell Rosenau: Treating [name]/Extender: [name] in Treatment: 26 Edema Assessment Assessed: [name]: No] [name]: No] Edema: [Left: Yes] [Right: Yes] Calf Left: Right: Point of Measurement: 48 cm From Medial Instep 52.8 cm 45 cm Ankle Left: Right: Point of Measurement: 10 cm From Medial Instep 30 cm 27 cm Knee To Floor Left: Right: From Medial Instep 56 cm Vascular Assessment Pulses: Dorsalis Pedis Palpable: [Left:Yes] [Right:Yes] Electronic Signature(s) Signed: 04/23/2020 4:56:08 PM By: Deon Pilling Signed: 05/12/2020 4:02:54 PM By: [name] RN, BSN Entered By: Deon Pilling on 04/23/2020 09:24:07 -------------------------------------------------------------------------------- Multi Wound Chart Details Patient Name: Date of Service: [name], [name] 04/23/2020 8:15 A M Medical Record Number: [id number] Patient Account Number: [id number] Date of Birth/Sex: Treating RN: May 28, 1985 (35 y.o. [name] Primary Care Peony Barner: [name] Other Clinician: Referring Satonya Lux: Treating Khloie Hamada/Extender: [name] in Treatment: 26 Vital Signs Height(in): 10 Capillary Blood Glucose(mg/dl): 140 Weight(lbs): 280 Pulse(bpm):  88 Body Mass Index(BMI): 33 Blood Pressure(mmHg): 134/81 Temperature(F): 98.2 Respiratory Rate(breaths/min): 17 Photos: [3:No Photos Left, Lateral Foot] [4:No Photos Right, Lateral Foot] [N/A:N/A N/A] Wound Location: [3:Trauma] [4:Gradually Appeared] [N/A:N/A] Wounding Event: [3:Diabetic Wound/Ulcer of the Lower] [4:Diabetic Wound/Ulcer of the Lower] [N/A:N/A] Primary Etiology: [3:Extremity Type II Diabetes] [4:Extremity Type II Diabetes] [N/A:N/A] Comorbid History: [3:10/02/2019] [4:02/06/2020] [N/A:N/A] Date Acquired: [3:26] [4:10] [N/A:N/A] Weeks of Treatment: [3:Open] [4:Open] [N/A:N/A] Wound Status: [3:2.8x3x0.9] [4:0x0x0] [N/A:N/A] Measurements L x W x [name] [3:6.597] [4:0] [N/A:N/A] A (cm) : rea [3:5.938] [4:0] [N/A:N/A] Volume (  cm) : [3:-300.10%] [4:100.00%] [N/A:N/A] % Reduction in A rea: [3:-3498.80%] [4:100.00%] [N/A:N/A] % Reduction in Volume: [3:2] Starting Position 1 (o'clock): [3:4] Ending Position 1 (o'clock): [3:0.9] Maximum Distance 1 (cm): [3:Yes] [4:No] [N/A:N/A] Undermining: [3:Grade 2] [4:Grade 2] [N/A:N/A] Classification: [3:Large] [4:None Present] [N/A:N/A] Exudate A mount: [3:Serous] [4:N/A] [N/A:N/A] Exudate Type: [3:amber] [4:N/A] [N/A:N/A] Exudate Color: [3:Thickened] [4:Distinct, outline attached] [N/A:N/A] Wound Margin: [3:Large (67-100%)] [4:None Present (0%)] [N/A:N/A] Granulation A mount: [3:Red, Friable] [4:N/A] [N/A:N/A] Granulation Quality: [3:Small (1-33%)] [4:None Present (0%)] [N/A:N/A] Necrotic A mount: [3:Fat Layer (Subcutaneous Tissue): Yes Fascia: No] [N/A:N/A] Exposed Structures: [3:Fascia: No Tendon: No Muscle: No Joint: No Bone: No Small (1-33%)] [4:Fat Layer (Subcutaneous Tissue): No Tendon: No Muscle: No Joint: No Bone: No Large (67-100%)] [N/A:N/A] Epithelialization: [3:T Contact Cast otal] [4:N/A] [N/A:N/A] Treatment Notes Wound #3 (Foot) Wound Laterality: Left, Lateral Cleanser Peri-Wound Care Zinc Oxide Ointment 30g  tube Discharge Instruction: Apply Zinc Oxide to periwound with each dressing change Sween Lotion (Moisturizing lotion) Discharge Instruction: Apply moisturizing lotion as directed Topical Primary Dressing KerraCel Ag Gelling Fiber Dressing, 4x5 in (silver alginate) Discharge Instruction: Apply silver alginate to wound bed as instructed Secondary Dressing ABD Pad, 5x9 Discharge Instruction: Apply over primary dressing as directed. Zetuvit Plus 4x8 in Discharge Instruction: Apply over primary dressing as directed. Drawtex 4x4 in Discharge Instruction: Apply over primary dressing as directed. T Contact Cast otal Secured With [employer], 4.5x3.1 (in/yd) Discharge Instruction: Secure with Kerlix as directed. Compression Wrap Compression Stockings Add-Ons Wound #4 (Foot) Wound Laterality: Right, Lateral Cleanser Peri-Wound Care Topical Primary Dressing Secondary Dressing Secured With Compression Wrap Compression Stockings Add-Ons Electronic Signature(s) Signed: 04/23/2020 4:36:20 PM By: [name] MD Signed: 04/23/2020 4:56:08 PM By: Deon Pilling Entered By: [name] on 04/23/2020 10:05:24 -------------------------------------------------------------------------------- Multi-Disciplinary Care Plan Details Patient Name: Date of Service: [name], [name] 04/23/2020 8:15 A M Medical Record Number: [id number] Patient Account Number: [id number] Date of Birth/Sex: Treating RN: [birth year] (35 y.o. [name] Primary Care Tarvares Lant: [name] Other Clinician: Referring Kaylani Fromme: Treating Yaelis Scharfenberg/Extender: [name] in Treatment: 26 Active Inactive Nutrition Nursing Diagnoses: Imbalanced nutrition Potential for alteratiion in Nutrition/Potential for imbalanced nutrition Goals: Patient/caregiver agrees to and verbalizes understanding of need to use nutritional supplements and/or vitamins as prescribed Date Initiated:  10/24/2019 Date Inactivated: 04/06/2020 Target Resolution Date: 04/03/2020 Goal Status: Met Patient/caregiver will maintain therapeutic glucose control Date Initiated: 10/24/2019 Target Resolution Date: 05/01/2020 Goal Status: Active Interventions: Assess HgA1c results as ordered upon admission and as needed Assess patient nutrition upon admission and as needed per policy Provide education on elevated blood sugars and impact on wound healing Provide education on nutrition Treatment Activities: Education provided on Nutrition : 11/01/2019 Notes: Wound/Skin Impairment Nursing Diagnoses: Impaired tissue integrity Knowledge deficit related to ulceration/compromised skin integrity Goals: Patient/caregiver will verbalize understanding of skin care regimen Date Initiated: 10/24/2019 Target Resolution Date: 05/01/2020 Goal Status: Active Ulcer/skin breakdown will have a volume reduction of 30% by week 4 Date Initiated: 10/24/2019 Date Inactivated: 01/16/2020 Target Resolution Date: 01/10/2020 Unmet Reason: no change in Goal Status: Unmet measurements. Interventions: Assess patient/caregiver ability to obtain necessary supplies Assess patient/caregiver ability to perform ulcer/skin care regimen upon admission and as needed Assess ulceration(s) every visit Provide education on ulcer and skin care Notes: Electronic Signature(s) Signed: 04/23/2020 4:56:08 PM By: Deon Pilling Entered By: Deon Pilling on 04/23/2020 08:23:15 -------------------------------------------------------------------------------- Pain Assessment Details Patient Name: Date of Service: [name], [name] 04/23/2020 8:15 A M Medical Record Number:  [id number] Patient Account Number: [id number] Date of Birth/Sex: Treating RN: 1985/11/24 (35 y.o. [name] Primary Care Tahliyah Anagnos: [name] Other Clinician: Referring Katheen Aslin: Treating Goldman Birchall/Extender: [name] in Treatment:  26 Active Problems Location of Pain Severity and Description of Pain Patient Has Paino No Site Locations Pain Management and Medication Current Pain Management: Electronic Signature(s) Signed: 04/23/2020 2:45:22 PM By: Sandre Kitty Signed: 04/23/2020 4:56:08 PM By: Deon Pilling Entered By: Sandre Kitty on 04/23/2020 08:26:22 -------------------------------------------------------------------------------- Patient/Caregiver Education Details Patient Name: Date of Service: [name], [name] 1/20/2022andnbsp8:15 [place] Record Number: [id number] Patient Account Number: [id number] Date of Birth/Gender: Treating RN: 1985/10/08 (35 y.o. [name] Primary Care Physician: [name] Other Clinician: Referring Physician: Treating Physician/Extender: [name] in Treatment: 26 Education Assessment Education Provided To: Patient Education Topics Provided Nutrition: Handouts: Nutrition Methods: Explain/Verbal Responses: Reinforcements needed Electronic Signature(s) Signed: 04/23/2020 4:56:08 PM By: Deon Pilling Entered By: Deon Pilling on 04/23/2020 08:23:28 -------------------------------------------------------------------------------- Wound Assessment Details Patient Name: Date of Service: [name], [name] 04/23/2020 8:15 A M Medical Record Number: [id number] Patient Account Number: [id number] Date of Birth/Sex: Treating RN: Jun 10, 1985 (35 y.o. [name] Primary Care Yatzari Jonsson: [name] Other Clinician: Referring Daisuke Bailey: Treating [place] Liz/Extender: [name] in Treatment: 26 Wound Status Wound Number: 3 Primary Etiology: Diabetic Wound/Ulcer of the Lower Extremity Wound Location: Left, Lateral Foot Wound Status: Open Wounding Event: Trauma Comorbid History: Type II Diabetes Date Acquired: 10/02/2019 Weeks Of Treatment: 26 Clustered Wound: No Photos Photo Uploaded By: [name] on 04/29/2020 11:16:26 Wound Measurements Length: (cm) 2.8 Width: (cm) 3 Depth: (cm) 0.9 Area: (cm) 6.597 Volume: (cm) 5.938 % Reduction in Area: -300.1% % Reduction in Volume: -3498.8% Epithelialization: Small (1-33%) Tunneling: No Undermining: Yes Starting Position (o'clock): 2 Ending Position (o'clock): 4 Maximum Distance: (cm) 0.9 Wound Description Classification: Grade 2 Wound Margin: Thickened Exudate Amount: Large Exudate Type: Serous Exudate Color: amber Foul Odor After Cleansing: No Slough/Fibrino Yes Wound Bed Granulation Amount: Large (67-100%) Exposed Structure Granulation Quality: Red, Friable Fascia Exposed: No Necrotic Amount: Small (1-33%) Fat Layer (Subcutaneous Tissue) Exposed: Yes Necrotic Quality: Adherent Slough Tendon Exposed: No Muscle Exposed: No Joint Exposed: No Bone Exposed: No Electronic Signature(s) Signed: 05/12/2020 4:02:54 PM By: [name] RN, BSN Entered By: [name] on 04/23/2020 08:43:51 -------------------------------------------------------------------------------- Wound Assessment Details Patient Name: Date of Service: [name], [name] 04/23/2020 8:15 A M Medical Record Number: [id number] Patient Account Number: [id number] Date of Birth/Sex: Treating RN: April 23, 1985 (35 y.o. [name] Primary Care Shandrea Lusk: [name] Other Clinician: Referring Tonisha Silvey: Treating Bitania Shankland/Extender: [name] in Treatment: 26 Wound Status Wound Number: 4 Primary Etiology: Diabetic Wound/Ulcer of the Lower Extremity Wound Location: Right, Lateral Foot Wound Status: Open Wounding Event: Gradually Appeared Comorbid History: Type II Diabetes Date Acquired: 02/06/2020 Weeks Of Treatment: 10 Clustered Wound: No Photos Photo Uploaded By: [name] on 04/29/2020 11:16:27 Wound Measurements Length: (cm) Width: (cm) Depth: (cm) Area: (cm) Volume: (cm) 0 % Reduction in Area:  100% 0 % Reduction in Volume: 100% 0 Epithelialization: Large (67-100%) 0 Tunneling: No 0 Undermining: No Wound Description Classification: Grade 2 Wound Margin: Distinct, outline attached Exudate Amount: None Present Foul Odor After Cleansing: No Slough/Fibrino No Wound Bed Granulation Amount: None Present (0%) Exposed Structure Necrotic Amount: None Present (0%) Fascia Exposed: No Fat Layer (Subcutaneous Tissue) Exposed: No Tendon Exposed: No Muscle Exposed: No Joint Exposed: No Bone Exposed: No  Electronic Signature(s) Signed: 05/12/2020 4:02:54 PM By: [name] RN, BSN Entered By: [name] on 04/23/2020 08:44:07 -------------------------------------------------------------------------------- Vitals Details Patient Name: Date of Service: [name], [name] 04/23/2020 8:15 A M Medical Record Number: [id number] Patient Account Number: [id number] Date of Birth/Sex: Treating RN: Apr 19, 1985 (35 y.o. [name] Primary Care Lejla Moeser: [name] Other Clinician: Referring Geovanie Winnett: Treating Carlus Stay/Extender: [name] in Treatment: 26 Vital Signs Time Taken: 08:25 Temperature (F): 98.2 Height (in): 77 Pulse (bpm): 89 Weight (lbs): 280 Respiratory Rate (breaths/min): 17 Body Mass Index (BMI): 33.2 Blood Pressure (mmHg): 134/81 Capillary Blood Glucose (mg/dl): 140 Reference Range: 80 - 120 mg / dl Electronic Signature(s) Signed: 04/23/2020 2:45:22 PM By: Sandre Kitty Entered By: Sandre Kitty on 04/23/2020 08:26:15

## 2020-05-14 ENCOUNTER — Other Ambulatory Visit: Payer: Self-pay

## 2020-05-14 ENCOUNTER — Encounter (HOSPITAL_BASED_OUTPATIENT_CLINIC_OR_DEPARTMENT_OTHER): Payer: BC Managed Care – PPO | Admitting: Internal Medicine

## 2020-05-14 DIAGNOSIS — E11621 Type 2 diabetes mellitus with foot ulcer: Secondary | ICD-10-CM | POA: Diagnosis not present

## 2020-05-14 NOTE — Progress Notes (Signed)
[place], [name] ([id number]) Visit Report for 05/14/2020 HPI Details Patient Name: Date of Service: [name] 05/14/2020 7:30 A M Medical Record Number: [id number] Patient Account Number: [id number] Date of Birth/Sex: Treating RN: 08-14-1985 (35 y.o. 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 He comes in for review of a wound over the plantar fifth metatarsal head extending into the lateral part of the foot. He was followed for this previously by his podiatrist Dr. Cornelius Moras. As the patient tells his story he went to see podiatry first for a swelling he developed on the lateral part of his fifth metatarsal head in May. He states this was "open" by podiatry and the area closed. He was followed up in June and it was again opened callus removed and it closed promptly. There were plans being made for surgery on the fifth metatarsal head in June however his blood sugar was apparently too high for anesthesia. Apparently the area was debrided and opened again in June and it is never closed since. Looking over the records from podiatry I am really not able to follow this. It was clear when he was first seen it was before 5/14 at that point he already had a wound. By 5/17 the ulcer was resolved. I do not see anything about a procedure. On 5/28 noted to have pre-ulcerative moderate keratosis. X-ray noted 1/5 contracted toe and tailor's bunion and metatarsal deformity. On a visit date on 09/28/2018 the dorsal part of the left foot it healed and resolved. There was concern about swelling in his lower extremity he was sent to the ER.. As far as I can tell he was seen in the ER on 7/12 with an ulcer on his left foot. A DVT rule out  of the left leg was negative. I do not think I have complete records from podiatry but I am not able to verify the procedures this patient states he had. He states after the last procedure the wound has never closed although I am not able to follow this in the records I have from podiatry. He has not had a recent x-ray The patient has been using Neosporin on the wound. He is wearing a Darco shoe. He is still very active up on his foot working and exercising. Past medical history; type 2 diabetes ketosis-prone, leg swelling with a negative DVT study in July. Non-smoker ABI in our clinic was 0.85 on the left 10/16; substantial wound on the plantar left fifth met head extending laterally almost to the dorsal fifth MTP. We have been using silver alginate we gave him a Darco forefoot off loader. An x-ray did not show evidence of osteomyelitis did note soft tissue emphysema which I think was due to gas tracking through an open wound. There is no doubt in my mind he requires an MRI 10/23; MRI not booked until 34 November at the earliest this is largely due to his glucose sensor in the right arm. We have been using silver alginate. There has been an improvement 10/29; I am still not exactly sure when his MRI is booked for. He says it is the third but it is the 10th in epic. This definitely needs to be done. He is running a low-grade fever today but no other symptoms. No real  improvement in the 1 02/26/2019 patient presents today for a follow-up visit here in our clinic he is last been seen in the clinic on October 29. Subsequently we were working on getting MRI to evaluate and see what exactly was going on and where we would need to go from the standpoint of whether or not he had osteomyelitis and again what treatments were going be required. Subsequently the patient ended up being admitted to the hospital on 02/07/2019 and was discharged on 02/14/2019. This is a somewhat interesting admission with a discharge  diagnosis of pneumonia due to COVID-19 although he was positive for COVID-19 when tested at the urgent care but negative x2 when he was actually in the hospital. With that being said he did have acute respiratory failure with hypoxia and it was noted he also have a left foot ulceration with osteomyelitis. With that being said he did require oxygen for his pneumonia and I level 4 L. He was placed on antivirals and steroids for the COVID-19. He was also transferred to the [facility] at one point. Nonetheless he did subsequently discharged home and since being home has done much better in that regard. The CT angiogram did not show any pulmonary embolism. With regard to the osteomyelitis the patient was placed on vancomycin and Zosyn while in the hospital but has been changed to Augmentin at discharge. It was also recommended that he follow- up with wound care and podiatry. Podiatry however wanted him to see [name] according to the patient prior to them doing anything further. His hemoglobin A1c was 9.9 as noted in the hospital. Have an MRI of the left foot performed while in the hospital on 02/04/2019. 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 There is an overlying plantar open wound noted an abscess tracking back along the lateral aspect of the fifth metatarsal shaft. There is otherwise diffuse cellulitis and mild fasciitis without findings of polymyositis. The patient did have recently pneumonia secondary to COVID-19 I looked in the chart through epic and it does appear that the patient may need to have an additional x-ray just to ensure everything is cleared and that he has no airspace disease prior to putting him into the chamber. 03/05/2019; patient was readmitted to the clinic last week. He was hospitalized twice for a viral upper respiratory tract infection from 11/1 through 11/4 and then 11/5  through 11/12 ultimately this turned out to be Covid pneumonitis. Although he was discharged on oxygen he is not using it. He says he feels fine. He has no exercise limitation no cough no sputum. His O2 sat in our clinic today was 100% on room air. 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 He received Vanco and Zosyn in the hospital and then was discharged on 2 weeks of Augmentin. I do not see any relevant cultures. He was supposed to follow-up with infectious disease but I do not see that he has an appointment. 12/8; patient saw Dr. Novella Olive of infectious disease last week. He felt that he had had adequate antibiotic therapy. He did not go to follow-up with Dr. Amalia Hailey of podiatry and I have again talked to him about the pros and cons of this. He does not want to consider a ray amputation of this time. He is aware of the risks  of recurrence, migration etc. He started HBO today and tolerated this well. He can complete the Augmentin that I gave him last week. I have looked over the lab work that Dr. Chana Bode ordered his C-reactive protein was 3.3 and his sedimentation rate was 17. The C-reactive protein is never really been measurably that high in this patient 12/15; not much change in the wound today however he has undermining along the lateral part of the foot again more extensively than last week. He has some rims of epithelialization. We have been using silver alginate. He is undergoing hyperbarics but did not dive today 12/18; in for his obligatory first total contact cast change. Unfortunately there was pus coming from the undermining area around his fifth metatarsal head. This was cultured but will preclude reapplication of a cast. He is seen in conjunction with HBO 12/24; patient had staph lugdunensis in the wound in the undermining area laterally last time. We put him on doxycycline  which should have covered this. The wound looks better today. I am going to give him another week of doxycycline before reattempting the total contact cast 12/31; the patient is completing antibiotics. Hemorrhagic debris in the distal part of the wound with some undermining distally. He also had hyper granulation. Extensive debridement with a #5 curette. The infected area that was on the lateral part of the fifth met head is closed over. I do not think he needs any more antibiotics. Patient was seen prior to HBO. Preparations for a total contact cast were made in the cast will be placed post hyperbarics 04/11/19; once again the patient arrives today without complaint. He had been in a cast all week noted that he had heavy drainage this week. This resulted in large raised areas of macerated tissue around the wound 1/14; wound bed looks better slightly smaller. Hydrofera Blue has been changing himself. He had a heavy drainage last week which caused a lot of maceration around the wound so I took him out of a total contact cast he says the drainage is actually better this week He is seen today in conjunction with HBO 1/21; returns to clinic. He was up in [place] for a day or 2 attending a funeral. He comes back in with the wound larger and with a large area of exposed bone. He had osteomyelitis and septic arthritis of the fifth left metatarsal head while he was in hospital. He received IV antibiotics in the hospital for a prolonged period of time then 3 weeks of Augmentin. Subsequently I gave him 2 weeks of doxycycline for more superficial wound infection. When I saw this last week the wound was smaller the surface of the wound looks satisfactory. 1/28; patient missed hyperbarics today. Bone biopsy I did last time showed Enterococcus faecalis and Staphylococcus lugdunensis . He has a wide area of exposed bone. We are going to use silver alginate as of today. I had another ethical discussion with the  patient. 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 In this situation I think the likelihood of healing this is low. Therefore I have recommended a ray amputation and with the patient's agreement I have referred him to Dr. Doran Durand. The other issue is that his compliance with hyperbarics has been minimal because of his work schedule and given his underlying decision I am going to stop this today READMISSION 10/24/2019 MRI 09/29/2019 left foot IMPRESSION: 1. Apparent skin ulceration inferior and lateral to the 5th metatarsal base with underlying heterogeneous T2  signal and enhancement in the subcutaneous fat. Small peripherally enhancing fluid collections along the plantar and lateral aspects of the 5th metatarsal base suspicious for abscesses. 2. Interval amputation through the mid 5th metatarsal with nonspecific low-level marrow edema and enhancement. Given the proximity to the adjacent soft tissue inflammatory changes, osteomyelitis cannot be excluded. 3. The additional bones appear unremarkable. MRI 09/29/2019 right foot IMPRESSION: 1. Soft tissue ulceration lateral to the 5th MTP joint. There is low-level T2 hyperintensity within the 4th and 5th metatarsal heads and adjacent proximal phalanges without abnormal T1 signal or cortical destruction. These findings are nonspecific and could be seen with early marrow edema, hyperemia or early osteomyelitis. No evidence of septic joint. 2. Mild tenosynovitis and synovial enhancement associated with the extensor digitorum tendons at the level of the midfoot. 3. Diffuse low-level muscular T2 hyperintensity and enhancement, most consistent with diabetic myopathy. LEFT FOOT BONE Methicillin resistant staphylococcus aureus Staphylococcus lugdunensis MIC MIC CIPROFLOXACIN >=8 RESISTANT Resistant <=0.5 SENSI... Sensitive CLINDAMYCIN <=0.25 SENS... Sensitive >=8 RESISTANT Resistant ERYTHROMYCIN >=8 RESISTANT  Resistant >=8 RESISTANT Resistant GENTAMICIN <=0.5 SENSI... Sensitive <=0.5 SENSI... Sensitive Inducible Clindamycin NEGATIVE Sensitive NEGATIVE Sensitive OXACILLIN >=4 RESISTANT Resistant 2 SENSITIVE Sensitive RIFAMPIN <=0.5 SENSI... Sensitive <=0.5 SENSI... Sensitive TETRACYCLINE <=1 SENSITIVE Sensitive <=1 SENSITIVE Sensitive TRIMETH/SULFA <=10 SENSIT Sensitive <=10 SENSIT Sensitive ... Marland Kitchen.. VANCOMYCIN 1 SENSITIVE Sensitive <=0.5 SENSI... Sensitive Right foot bone . Component 3 wk ago Specimen Description BONE Special Requests RIGHT 4 METATARSAL SAMPLE B Gram Stain NO WBC SEEN NO ORGANISMS SEEN Culture RARE METHICILLIN RESISTANT STAPHYLOCOCCUS AUREUS NO ANAEROBES ISOLATED Performed at [facility], [place] [address], [place], [place] [address] Report Status 10/08/2019 FINAL Organism ID, Bacteria METHICILLIN RESISTANT STAPHYLOCOCCUS AUREUS Resulting Agency [facility] Susceptibility Methicillin resistant staphylococcus aureus MIC CIPROFLOXACIN >=8 RESISTANT Resistant CLINDAMYCIN <=0.25 SENS... Sensitive ERYTHROMYCIN >=8 RESISTANT Resistant GENTAMICIN <=0.5 SENSI... Sensitive Inducible Clindamycin NEGATIVE Sensitive OXACILLIN >=4 RESISTANT Resistant RIFAMPIN <=0.5 SENSI... Sensitive TETRACYCLINE <=1 SENSITIVE Sensitive TRIMETH/SULFA <=10 SENSIT Sensitive ... VANCOMYCIN 1 SENSITIVE Sensitive This is a patient we had in clinic earlier this year with a wound over his left fifth metatarsal head. 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 Doran Durand on 05/16/2019. He seems to have developed problems with wounds on his bilateral feet in June 2021 although he may have had problems earlier than this. He was  in an urgent care with a right foot ulcer on 09/26/2019 and given a course of doxycycline. This was apparently after having trouble getting into see orthopedics. He was seen by podiatry on 09/28/2019 noted to have bilateral lower extremity ulcers including the left lateral fifth metatarsal base and the right subfifth met head. It was noted that had purulent drainage at that time. He required hospitalization from 6/20 through 7/2. This was because of worsening right foot wounds. He underwent bilateral operative incision and drainage and bone biopsies bilaterally. Culture results are listed above. He has been referred back to clinic by Dr. Jacqualyn Posey of podiatry. He is also followed by Dr. Megan Salon who saw him yesterday. He was discharged from hospital on Zyvox Flagyl and Levaquin and yesterday changed to doxycycline Flagyl and Levaquin. His inflammatory markers on 6/26 showed a sedimentation rate of 129 and a C-reactive protein of 5. This  is improved to 14 and 1.3 respectively. This would indicate improvement. ABIs in our clinic today were 1.23 on the right and 1.20 on the left 11/01/2019 on evaluation today patient appears to be doing fairly well in regard to the wounds on his feet at this point. Fortunately there is no signs of active infection at this time. No fevers, chills, nausea, vomiting, or diarrhea. He currently is seeing infectious disease and still under their care at this point. Subsequently he also has both wounds which she has not been using collagen on as he did not receive that in his packaging he did not call us and let us know that. Apparently that just was missed on the order. Nonetheless we will get that straightened out today. 8/9-Patient returns for bilateral foot wounds, using Prisma with hydrogel moistened dressings, and the wounds appear stable. Patient using surgical shoes, avoiding much pressure or weightbearing as much as possible 8/16; patient has bilateral foot wounds. 1 on the  right lateral foot proximally the other is on the left mid lateral foot. Both required debridement of callus and thick skin around the wounds. We have been using silver collagen 8/27; patient has bilateral lateral foot wounds. The area on the left substantially surrounded by callus and dry skin. This was removed from the wound edge. The underlying wound is small. The area on the right measured somewhat smaller today. 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 Unfortunately I did not update his antibiotics during today's visit. 9/10 I reviewed Dr. Hale Bogus last notes he felt he had completed antibiotics his inflammatory markers were reasonably well controlled. He has a small wound on the lateral left foot and a tiny area on the right which is just above closed. He is using Hydrofera Blue with border foam he has bilateral surgical shoes 9/24; 2 week f/u. doing well. right foot is closed. left foot still undermined. 10/14; right foot remains closed at the fifth met head. The area over the base of the left fifth metatarsal has a small open area but considerable undermining towards the plantar foot. Thick callus skin around this suggests an adequate pressure relief. We have talked about this. He says he is going to go back into his cam boot. I suggested a total contact cast he did not seem enamored with this suggestion 10/26; left foot base of the fifth metatarsal. Same condition as last time. He has skin over the area with an open wound however the skin is not adherent. He went to see Dr. Earleen Newport who did an x-ray and culture of his foot I have not reviewed the x-ray but the patient was not told anything. He is on doxycycline 11/11; since the patient was last here he was in the emergency room on 10/30 he was concerned about swelling in the left foot. They did not do any cultures or x-rays. They changed his antibiotics to cephalexin.  Previous culture showed group B strep. The cephalexin is appropriate as doxycycline has less than predictable coverage. Arrives in clinic today with swelling over this area under the wound. He also has a new wound on the right fifth metatarsal head 11/18; the patient has a difficult wound on the lateral aspect of the left fifth metatarsal head. The wound was almost ballotable last week I opened it slightly expecting to see purulence however there was just bleeding. I cultured this this was negative. X-ray unchanged. We are trying to get an  MRI but I am not sure were going to be able to get this through his insurance. He also has an area on the right lateral fifth metatarsal head this looks healthier 12/3; the patient finally got our MRI. Surprisingly this did not show osteomyelitis. I did show the soft tissue ulceration at the lateral plantar aspect of the fifth metatarsal base with a tiny residual 6 mm abscess overlying the superficial fascia I have tried to culture this area I have not been able to get this to grow anything. Nevertheless the protruding tissue looks aggravated. I suspect we should try to treat the underlying "abscess with broad-spectrum antibiotics. I am going to start him on Levaquin and Flagyl. He has much less edema in his legs and I am going to continue to wrap his legs and see him weekly 12/10. I started Levaquin and Flagyl on him last week. He just picked up the Flagyl apparently there was some delay. The worry is the wound on the left fifth metatarsal base which is substantial and worsening. His foot looks like he inverts at the ankle making this a weightbearing surface. Certainly no improvement in fact I think the measurements of this are somewhat worse. We have been using 12/17; he apparently just got the Levaquin yesterday this is 2 weeks after the fact. He has completed the Flagyl. The area over the left fifth metatarsal base still has protruding granulation tissue although  it does not look quite as bad as it did some weeks ago. He has severe bilateral lymphedema although we have not been treating him for wounds on his legs this is definitely going to require compression. There was so much edema in the left I did not wish to put him in a total contact cast today. I am going to increase his compression from 3-4 layer. The area on the right lateral fifth met head actually look quite good and superficial. 12/23; patient arrived with callus on the right fifth met head and the substantial hyper granulated callused wound on the base of his fifth metatarsal. He says he is completing his Levaquin in 2 days but I do not think that adds up with what I gave him but I will have to double check this. We are using Hydrofera Blue on both areas. My plan is to put the left leg in a cast the week after New Year's 04/06/2020; patient's wounds about the same. Right lateral fifth metatarsal head and left lateral foot over the base of the fifth metatarsal. There is undermining on the left lateral foot which I removed before application of total contact cast continuing with Hydrofera Blue new. Patient tells me he was seen by endocrinology today lab work was done [Dr. Kerr]. Also wondering whether he was referred to cardiology. I went over some lab work from previously does not have chronic renal failure certainly not nephrotic range proteinuria he does have very poorly controlled diabetes but this is not his most updated lab work. Hemoglobin A1c has been over 11 1/10; the patient had a considerable amount of leakage towards mid part of his left foot with macerated skin however the wound surface looks better the area on the right lateral fifth met head is better as well. I am going to change the dressing on the left foot under the total contact cast to silver alginate, continue with Hydrofera Blue on the right. 1/20; patient was in the total contact cast for 10 days. Considerable amount of  drainage although the skin  around the wound does not look too bad on the left foot. The area on the right fifth metatarsal head is closed. Our nursing staff reports large amount of drainage out of the left lateral foot wound 1/25; continues with copious amounts of drainage described by our intake staff. PCR culture I did last week showed E. coli and Enterococcus faecalis and low quantities. Multiple resistance genes documented including extended spectrum beta lactamase, MRSA, MRSE, quinolone, tetracycline. The wound is not quite as good this week as it was 5 days ago but about the same size 2/3; continues with copious amounts of malodorous drainage per our intake nurse. The PCR culture I did 2 weeks ago showed E. coli and low quantities of Enterococcus. There were multiple resistance genes detected. I put Neosporin on him last week although this does not seem to have helped. The wound is slightly deeper today. Offloading continues to be an issue here although with the amount of drainage she has a total contact cast is just not going to work 2/10; moderate amount of drainage. Patient reports he cannot get his stocking on over the dressing. I told him we have to do that the nurse gave him suggestions on how to make this work. The wound is on the bottom and lateral part of his left foot. Is cultured predominantly grew low amounts of Enterococcus, E. coli and anaerobes. There were multiple resistance genes detected including extended spectrum beta lactamase, quinolone, tetracycline. I could not think of an easy oral combination to address this so for now I am going to do topical antibiotics provided by [facility] I think the main agents here are vancomycin and an aminoglycoside. We have to be able to give him access to the wounds to get the topical antibiotic on Electronic Signature(s) Signed: 05/14/2020 5:13:40 PM By: [name] MD Entered By: [name] on 05/14/2020  08:48:10 -------------------------------------------------------------------------------- Physical Exam Details Patient Name: Date of Service: [name], [name] 05/14/2020 7:30 A M Medical Record Number: [id number] Patient Account Number: [id number] Date of Birth/Sex: Treating RN: [birth year] (35 y.o. [name] Primary Care Provider: [name] Other Clinician: Referring Provider: Treating Provider/Extender: [name] in Treatment: 29 Constitutional Sitting or standing Blood Pressure is within target range for patient.. Pulse regular and within target range for patient.Marland Kitchen Respirations regular, non-labored and within target range.. Temperature is normal and within the target range for the patient.Marland Kitchen Appears in no distress. Notes Wound exam; left lateral fifth metatarsal base. This does not look too bad there is depth of this in the middle but it does not go to bone. I did not debride this but I think ongoing debridement may be necessary to remove the biofilm after we start him on topical antibiotics. We are using silver alginate in the meantime. Also noteworthy is that the patient has lymphedema Electronic Signature(s) Signed: 05/14/2020 5:13:40 PM By: [name] MD Entered By: [name] on 05/14/2020 08:49:01 -------------------------------------------------------------------------------- Physician Orders Details Patient Name: Date of Service: [name], [name] 05/14/2020 7:30 A M Medical Record Number: [id number] Patient Account Number: [id number] Date of Birth/Sex: Treating RN: 1985/08/04 (35 y.o. [name] Primary Care Provider: [name] Other Clinician: Referring Provider: Treating Provider/Extender: [name] in Treatment: 29 Verbal / Phone Orders: No Diagnosis Coding ICD-10 Coding Code Description E11.621 Type 2 diabetes mellitus with foot ulcer L97.528 Non-pressure chronic ulcer of other  part of left foot with other specified severity Follow-up Appointments Return Appointment  in 1 week. Bathing/ Shower/ Hygiene May shower with protection but do not get wound dressing(s) wet. - may use cast protectors Edema Control - Lymphedema / SCD / Other Bilateral Lower Extremities Elevate legs to the level of the heart or above for 30 minutes daily and/or when sitting, a frequency of: - throughout the day Avoid standing for long periods of time. Exercise regularly Moisturize legs daily. - both legs every night before bed. Compression stocking or Garment 20-30 mm/Hg pressure to: - Apply to both legs in the morning and remove at night. Wound Treatment Wound #3 - Foot Wound Laterality: Left, Lateral Cleanser: Soap and Water 1 x Per Day/30 Days Discharge Instructions: May shower and wash wound with dial antibacterial soap and water prior to dressing change. Peri-Wound Care: Zinc Oxide Ointment 30g tube 1 x Per Day/30 Days Discharge Instructions: Apply Zinc Oxide to periwound with each dressing change Peri-Wound Care: Sween Lotion (Moisturizing lotion) 1 x Per Day/30 Days Discharge Instructions: Apply moisturizing lotion as directed Topical: Triple Antibiotic Ointment, 1 (oz) Tube 1 x Per Day/30 Days Discharge Instructions: Apply under the alginate Ag until the keystone antibiotics arrive. Then apply keystone antibiotic to wound daily once arrives. Topical: keystone antibiotics 1 x Per Day/30 Days Discharge Instructions: apply topically once arrives to home. apply daily in place of Neosporin. Prim Dressing: KerraCel Ag Gelling Fiber Dressing, 4x5 in (silver alginate) (Generic) 1 x Per Day/30 Days ary Discharge Instructions: Apply silver alginate to wound bed as instructed Secondary Dressing: ABD Pad, 8x10 1 x Per Day/30 Days Discharge Instructions: T down before applying kerlix. Apply over primary dressing as directed. ape Secured With: [employer], 4.5x3.1 (in/yd) (Generic) 1 x  Per Day/30 Days Discharge Instructions: Secure with Kerlix as directed. Secured With: 52M Medipore H Soft Cloth Surgical T 4 x 2 (in/yd) (Generic) 1 x Per Day/30 Days ape Discharge Instructions: Secure dressing with tape as directed. Add-Ons: felt 1 x Per Day/30 Days Discharge Instructions: apply felt in shoe. Electronic Signature(s) Signed: 05/14/2020 5:13:40 PM By: [name] MD Signed: 05/14/2020 6:08:01 PM By: Deon Pilling Entered By: Deon Pilling on 05/14/2020 08:30:44 -------------------------------------------------------------------------------- Problem List Details Patient Name: Date of Service: [name], [name] 05/14/2020 7:30 A M Medical Record Number: [id number] Patient Account Number: [id number] Date of Birth/Sex: Treating RN: November 08, 1985 (35 y.o. 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, right ankle and foot 10/24/2019 10/24/2019 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, left ankle and foot 10/24/2019 10/24/2019 B95.62 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 10/24/2019 10/24/2019 classified elsewhere Resolved Problems Electronic Signature(s) Signed: 05/14/2020 5:13:40 PM By: [name] MD Entered By: [name] on 05/14/2020  08:46:20 -------------------------------------------------------------------------------- Progress Note Details Patient Name: Date of Service: [name], [name] 05/14/2020 7:30 A M Medical Record Number: [id number] Patient Account Number: [id number] Date of Birth/Sex: Treating RN: 1986/01/16 (35 y.o. 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 He comes in for review of a wound over the plantar fifth metatarsal head extending into the lateral part of the foot. He was followed for this previously by his podiatrist Dr. Cornelius Moras. As the patient tells his story he went to see podiatry first for a swelling he developed on the lateral part of his fifth metatarsal head in May. He states this was "open" by podiatry and the area closed. He was followed up in June and it was again opened callus removed and it closed promptly. There were plans being made for surgery on the fifth metatarsal head in June however his blood sugar was apparently too high for anesthesia. Apparently the area was debrided and opened again in June and it is never closed since. Looking over the records from podiatry I am really not able to follow this. It was clear when he was first seen it was before 5/14 at that point he already had a wound. By 5/17 the ulcer was resolved. I do not see anything about a procedure. On 5/28 noted to have pre-ulcerative moderate keratosis. X-ray noted 1/5 contracted toe and tailor's bunion and metatarsal deformity. On a visit date on 09/28/2018 the dorsal part of the left foot it healed and resolved. There was concern about swelling in his lower extremity he was sent to the ER.. As far as I can tell he was seen in the ER on 7/12  with an ulcer on his left foot. A DVT rule out of the left leg was negative. I do not think I have complete records from podiatry but I am not able to verify the procedures this patient states he had. He states after the last procedure the wound has never closed although I am not able to follow this in the records I have from podiatry. He has not had a recent x-ray The patient has been using Neosporin on the wound. He is wearing a Darco shoe. He is still very active up on his foot working and exercising. Past medical history; type 2 diabetes ketosis-prone, leg swelling with a negative DVT study in July. Non-smoker ABI in our clinic was 0.85 on the left 10/16; substantial wound on the plantar left fifth met head extending laterally almost to the dorsal fifth MTP. We have been using silver alginate we gave him a Darco forefoot off loader. An x-ray did not show evidence of osteomyelitis did note soft tissue emphysema which I think was due to gas tracking through an open wound. There is no doubt in my mind he requires an MRI 10/23; MRI not booked until 34 November at the earliest this is largely due to his glucose sensor in the right arm. We have been using silver alginate. There has been an improvement 10/29; I am still not exactly sure when his MRI is booked for. He says it is the third but it is the 10th in epic. This definitely needs to be done. He is running a low-grade fever today but no other symptoms. No real improvement in the 1 02/26/2019 patient presents today for a follow-up visit here in our clinic he is last been seen in the clinic on October 29. Subsequently we were working on getting MRI to evaluate and see what exactly was going on and where we would need to go from the standpoint of whether or not he had osteomyelitis and again what treatments  were going be required. Subsequently the patient ended up being admitted to the hospital on 02/07/2019 and was discharged on 02/14/2019. This is a  somewhat interesting admission with a discharge diagnosis of pneumonia due to COVID-19 although he was positive for COVID-19 when tested at the urgent care but negative x2 when he was actually in the hospital. With that being said he did have acute respiratory failure with hypoxia and it was noted he also have a left foot ulceration with osteomyelitis. With that being said he did require oxygen for his pneumonia and I level 4 L. He was placed on antivirals and steroids for the COVID-19. He was also transferred to the [place] at one point. Nonetheless he did subsequently discharged home and since being home has done much better in that regard. The CT angiogram did not show any pulmonary embolism. With regard to the osteomyelitis the patient was placed on vancomycin and Zosyn while in the hospital but has been changed to Augmentin at discharge. It was also recommended that he follow- up with wound care and podiatry. Podiatry however wanted him to see [name] according to the patient prior to them doing anything further. His hemoglobin A1c was 9.9 as noted in the hospital. Have an MRI of the left foot performed while in the hospital on 02/04/2019. 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 There is an overlying plantar open wound noted an abscess tracking back along the lateral aspect of the fifth metatarsal shaft. There is otherwise diffuse cellulitis and mild fasciitis without findings of polymyositis. The patient did have recently pneumonia secondary to COVID-19 I looked in the chart through epic and it does appear that the patient may need to have an additional x-ray just to ensure everything is cleared and that he has no airspace disease prior to putting him into the chamber. 03/05/2019; patient was readmitted to the clinic last week. He was hospitalized twice for a viral upper respiratory tract  infection from 11/1 through 11/4 and then 11/5 through 11/12 ultimately this turned out to be Covid pneumonitis. Although he was discharged on oxygen he is not using it. He says he feels fine. He has no exercise limitation no cough no sputum. His O2 sat in our clinic today was 100% on room air. 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 He received Vanco and Zosyn in the hospital and then was discharged on 2 weeks of Augmentin. I do not see any relevant cultures. He was supposed to follow-up with infectious disease but I do not see that he has an appointment. 12/8; patient saw Dr. Novella Olive of infectious disease last week. He felt that he had had adequate antibiotic therapy. He did not go to follow-up with Dr. Amalia Hailey of podiatry and I have again talked to him about the pros and cons of this. He does not want to consider a ray amputation of this time. He is aware of the risks of recurrence, migration etc. He started HBO today and tolerated this well. He can complete the Augmentin that I gave him last week. I have looked over the lab work that Dr. Chana Bode ordered his C-reactive protein was 3.3 and his sedimentation rate was 17. The C-reactive protein is never really been measurably that high in this patient 12/15; not much change in the  wound today however he has undermining along the lateral part of the foot again more extensively than last week. He has some rims of epithelialization. We have been using silver alginate. He is undergoing hyperbarics but did not dive today 12/18; in for his obligatory first total contact cast change. Unfortunately there was pus coming from the undermining area around his fifth metatarsal head. This was cultured but will preclude reapplication of a cast. He is seen in conjunction with HBO 12/24; patient had staph lugdunensis in the wound in the undermining area  laterally last time. We put him on doxycycline which should have covered this. The wound looks better today. I am going to give him another week of doxycycline before reattempting the total contact cast 12/31; the patient is completing antibiotics. Hemorrhagic debris in the distal part of the wound with some undermining distally. He also had hyper granulation. Extensive debridement with a #5 curette. The infected area that was on the lateral part of the fifth met head is closed over. I do not think he needs any more antibiotics. Patient was seen prior to HBO. Preparations for a total contact cast were made in the cast will be placed post hyperbarics 04/11/19; once again the patient arrives today without complaint. He had been in a cast all week noted that he had heavy drainage this week. This resulted in large raised areas of macerated tissue around the wound 1/14; wound bed looks better slightly smaller. Hydrofera Blue has been changing himself. He had a heavy drainage last week which caused a lot of maceration around the wound so I took him out of a total contact cast he says the drainage is actually better this week He is seen today in conjunction with HBO 1/21; returns to clinic. He was up in [place] for a day or 2 attending a funeral. He comes back in with the wound larger and with a large area of exposed bone. He had osteomyelitis and septic arthritis of the fifth left metatarsal head while he was in hospital. He received IV antibiotics in the hospital for a prolonged period of time then 3 weeks of Augmentin. Subsequently I gave him 2 weeks of doxycycline for more superficial wound infection. When I saw this last week the wound was smaller the surface of the wound looks satisfactory. 1/28; patient missed hyperbarics today. Bone biopsy I did last time showed Enterococcus faecalis and Staphylococcus lugdunensis . He has a wide area of exposed bone. We are going to use silver alginate as of  today. I had another ethical discussion with the patient. 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 In this situation I think the likelihood of healing this is low. Therefore I have recommended a ray amputation and with the patient's agreement I have referred him to Dr. Doran Durand. The other issue is that his compliance with hyperbarics has been minimal because of his work schedule and given his underlying decision I am going to stop this today READMISSION 10/24/2019 MRI 09/29/2019 left foot IMPRESSION: 1. Apparent skin ulceration inferior and lateral to the 5th metatarsal base with underlying heterogeneous T2 signal and enhancement in the subcutaneous fat. Small peripherally enhancing fluid collections along the plantar and lateral aspects of the 5th metatarsal base suspicious for abscesses. 2. Interval amputation through the mid 5th metatarsal with nonspecific low-level marrow edema and enhancement. Given the proximity to the adjacent soft tissue inflammatory changes, osteomyelitis cannot be excluded. 3. The additional bones appear unremarkable. MRI 09/29/2019 right  foot IMPRESSION: 1. Soft tissue ulceration lateral to the 5th MTP joint. There is low-level T2 hyperintensity within the 4th and 5th metatarsal heads and adjacent proximal phalanges without abnormal T1 signal or cortical destruction. These findings are nonspecific and could be seen with early marrow edema, hyperemia or early osteomyelitis. No evidence of septic joint. 2. Mild tenosynovitis and synovial enhancement associated with the extensor digitorum tendons at the level of the midfoot. 3. Diffuse low-level muscular T2 hyperintensity and enhancement, most consistent with diabetic myopathy. LEFT FOOT BONE Methicillin resistant staphylococcus aureus Staphylococcus lugdunensis MIC MIC CIPROFLOXACIN >=8 RESISTANT Resistant <=0.5 SENSI... Sensitive CLINDAMYCIN <=0.25 SENS... Sensitive >=8  RESISTANT Resistant ERYTHROMYCIN >=8 RESISTANT Resistant >=8 RESISTANT Resistant GENTAMICIN <=0.5 SENSI... Sensitive <=0.5 SENSI... Sensitive Inducible Clindamycin NEGATIVE Sensitive NEGATIVE Sensitive OXACILLIN >=4 RESISTANT Resistant 2 SENSITIVE Sensitive RIFAMPIN <=0.5 SENSI... Sensitive <=0.5 SENSI... Sensitive TETRACYCLINE <=1 SENSITIVE Sensitive <=1 SENSITIVE Sensitive TRIMETH/SULFA <=10 SENSIT Sensitive <=10 SENSIT Sensitive ... Marland Kitchen.. VANCOMYCIN 1 SENSITIVE Sensitive <=0.5 SENSI... Sensitive Right foot bone . Component 3 wk ago Specimen Description BONE Special Requests RIGHT 4 METATARSAL SAMPLE B Gram Stain NO WBC SEEN NO ORGANISMS SEEN Culture RARE METHICILLIN RESISTANT STAPHYLOCOCCUS AUREUS NO ANAEROBES ISOLATED Performed at [facility], [place] [address], [place], [place] [address] Report Status 10/08/2019 FINAL Organism ID, Bacteria METHICILLIN RESISTANT STAPHYLOCOCCUS AUREUS Resulting Agency [facility] Susceptibility Methicillin resistant staphylococcus aureus MIC CIPROFLOXACIN >=8 RESISTANT Resistant CLINDAMYCIN <=0.25 SENS... Sensitive ERYTHROMYCIN >=8 RESISTANT Resistant GENTAMICIN <=0.5 SENSI... Sensitive Inducible Clindamycin NEGATIVE Sensitive OXACILLIN >=4 RESISTANT Resistant RIFAMPIN <=0.5 SENSI... Sensitive TETRACYCLINE <=1 SENSITIVE Sensitive TRIMETH/SULFA <=10 SENSIT Sensitive ... VANCOMYCIN 1 SENSITIVE Sensitive This is a patient we had in clinic earlier this year with a wound over his left fifth metatarsal head. 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 Doran Durand on 05/16/2019. He seems to have developed problems with wounds on his bilateral feet in June 2021 although he  may have had problems earlier than this. He was in an urgent care with a right foot ulcer on 09/26/2019 and given a course of doxycycline. This was apparently after having trouble getting into see orthopedics. He was seen by podiatry on 09/28/2019 noted to have bilateral lower extremity ulcers including the left lateral fifth metatarsal base and the right subfifth met head. It was noted that had purulent drainage at that time. He required hospitalization from 6/20 through 7/2. This was because of worsening right foot wounds. He underwent bilateral operative incision and drainage and bone biopsies bilaterally. Culture results are listed above. He has been referred back to clinic by Dr. Jacqualyn Posey of podiatry. He is also followed by Dr. Megan Salon who saw him yesterday. He was discharged from hospital on Zyvox Flagyl and Levaquin and yesterday changed to doxycycline Flagyl and Levaquin. His inflammatory markers on 6/26 showed a sedimentation rate of 129 and a C-reactive protein of 5. This is improved to 14 and 1.3 respectively. This would indicate improvement. ABIs in our clinic today were 1.23 on the right and 1.20 on the left 11/01/2019 on evaluation today patient appears to be doing fairly well in regard to the wounds on his feet at this point. Fortunately there is no signs of active infection at this time. No fevers, chills, nausea, vomiting,  or diarrhea. He currently is seeing infectious disease and still under their care at this point. Subsequently he also has both wounds which she has not been using collagen on as he did not receive that in his packaging he did not call us and let us know that. Apparently that just was missed on the order. Nonetheless we will get that straightened out today. 8/9-Patient returns for bilateral foot wounds, using Prisma with hydrogel moistened dressings, and the wounds appear stable. Patient using surgical shoes, avoiding much pressure or weightbearing as much as  possible 8/16; patient has bilateral foot wounds. 1 on the right lateral foot proximally the other is on the left mid lateral foot. Both required debridement of callus and thick skin around the wounds. We have been using silver collagen 8/27; patient has bilateral lateral foot wounds. The area on the left substantially surrounded by callus and dry skin. This was removed from the wound edge. The underlying wound is small. The area on the right measured somewhat smaller today. 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 Unfortunately I did not update his antibiotics during today's visit. 9/10 I reviewed Dr. Hale Bogus last notes he felt he had completed antibiotics his inflammatory markers were reasonably well controlled. He has a small wound on the lateral left foot and a tiny area on the right which is just above closed. He is using Hydrofera Blue with border foam he has bilateral surgical shoes 9/24; 2 week f/u. doing well. right foot is closed. left foot still undermined. 10/14; right foot remains closed at the fifth met head. The area over the base of the left fifth metatarsal has a small open area but considerable undermining towards the plantar foot. Thick callus skin around this suggests an adequate pressure relief. We have talked about this. He says he is going to go back into his cam boot. I suggested a total contact cast he did not seem enamored with this suggestion 10/26; left foot base of the fifth metatarsal. Same condition as last time. He has skin over the area with an open wound however the skin is not adherent. He went to see Dr. Earleen Newport who did an x-ray and culture of his foot I have not reviewed the x-ray but the patient was not told anything. He is on doxycycline 11/11; since the patient was last here he was in the emergency room on 10/30 he was concerned about swelling in the left foot. They did not do any cultures  or x-rays. They changed his antibiotics to cephalexin. Previous culture showed group B strep. The cephalexin is appropriate as doxycycline has less than predictable coverage. Arrives in clinic today with swelling over this area under the wound. He also has a new wound on the right fifth metatarsal head 11/18; the patient has a difficult wound on the lateral aspect of the left fifth metatarsal head. The wound was almost ballotable last week I opened it slightly expecting to see purulence however there was just bleeding. I cultured this this was negative. X-ray unchanged. We are trying to get an MRI but I am not sure were going to be able to get this through his insurance. He also has an area on the right lateral fifth metatarsal head this looks healthier 12/3; the patient finally got our MRI. Surprisingly this did not show osteomyelitis. I did show the soft tissue ulceration at the lateral plantar aspect of the fifth metatarsal base with a  tiny residual 6 mm abscess overlying the superficial fascia I have tried to culture this area I have not been able to get this to grow anything. Nevertheless the protruding tissue looks aggravated. I suspect we should try to treat the underlying "abscess with broad-spectrum antibiotics. I am going to start him on Levaquin and Flagyl. He has much less edema in his legs and I am going to continue to wrap his legs and see him weekly 12/10. I started Levaquin and Flagyl on him last week. He just picked up the Flagyl apparently there was some delay. The worry is the wound on the left fifth metatarsal base which is substantial and worsening. His foot looks like he inverts at the ankle making this a weightbearing surface. Certainly no improvement in fact I think the measurements of this are somewhat worse. We have been using 12/17; he apparently just got the Levaquin yesterday this is 2 weeks after the fact. He has completed the Flagyl. The area over the left fifth  metatarsal base still has protruding granulation tissue although it does not look quite as bad as it did some weeks ago. He has severe bilateral lymphedema although we have not been treating him for wounds on his legs this is definitely going to require compression. There was so much edema in the left I did not wish to put him in a total contact cast today. I am going to increase his compression from 3-4 layer. The area on the right lateral fifth met head actually look quite good and superficial. 12/23; patient arrived with callus on the right fifth met head and the substantial hyper granulated callused wound on the base of his fifth metatarsal. He says he is completing his Levaquin in 2 days but I do not think that adds up with what I gave him but I will have to double check this. We are using Hydrofera Blue on both areas. My plan is to put the left leg in a cast the week after New Year's 04/06/2020; patient's wounds about the same. Right lateral fifth metatarsal head and left lateral foot over the base of the fifth metatarsal. There is undermining on the left lateral foot which I removed before application of total contact cast continuing with Hydrofera Blue new. Patient tells me he was seen by endocrinology today lab work was done [Dr. Kerr]. Also wondering whether he was referred to cardiology. I went over some lab work from previously does not have chronic renal failure certainly not nephrotic range proteinuria he does have very poorly controlled diabetes but this is not his most updated lab work. Hemoglobin A1c has been over 11 1/10; the patient had a considerable amount of leakage towards mid part of his left foot with macerated skin however the wound surface looks better the area on the right lateral fifth met head is better as well. I am going to change the dressing on the left foot under the total contact cast to silver alginate, continue with Hydrofera Blue on the right. 1/20; patient was in  the total contact cast for 10 days. Considerable amount of drainage although the skin around the wound does not look too bad on the left foot. The area on the right fifth metatarsal head is closed. Our nursing staff reports large amount of drainage out of the left lateral foot wound 1/25; continues with copious amounts of drainage described by our intake staff. PCR culture I did last week showed E. coli and Enterococcus faecalis and low quantities.  Multiple resistance genes documented including extended spectrum beta lactamase, MRSA, MRSE, quinolone, tetracycline. The wound is not quite as good this week as it was 5 days ago but about the same size 2/3; continues with copious amounts of malodorous drainage per our intake nurse. The PCR culture I did 2 weeks ago showed E. coli and low quantities of Enterococcus. There were multiple resistance genes detected. I put Neosporin on him last week although this does not seem to have helped. The wound is slightly deeper today. Offloading continues to be an issue here although with the amount of drainage she has a total contact cast is just not going to work 2/10; moderate amount of drainage. Patient reports he cannot get his stocking on over the dressing. I told him we have to do that the nurse gave him suggestions on how to make this work. The wound is on the bottom and lateral part of his left foot. Is cultured predominantly grew low amounts of Enterococcus, E. coli and anaerobes. There were multiple resistance genes detected including extended spectrum beta lactamase, quinolone, tetracycline. I could not think of an easy oral combination to address this so for now I am going to do topical antibiotics provided by [facility] I think the main agents here are vancomycin and an aminoglycoside. We have to be able to give him access to the wounds to get the topical antibiotic on Objective Constitutional Sitting or standing Blood Pressure is within target range for  patient.. Pulse regular and within target range for patient.Marland Kitchen Respirations regular, non-labored and within target range.. Temperature is normal and within the target range for the patient.Marland Kitchen Appears in no distress. Vitals Time Taken: 8:03 AM, Height: 77 in, Source: Stated, Weight: 280 lbs, Source: Stated, BMI: 33.2, Temperature: 98.5 F, Pulse: 94 bpm, Respiratory Rate: 18 breaths/min, Blood Pressure: 131/87 mmHg, Capillary Blood Glucose: 130 mg/dl. General Notes: glucose per pt report this am General Notes: Wound exam; left lateral fifth metatarsal base. This does not look too bad there is depth of this in the middle but it does not go to bone. I did not debride this but I think ongoing debridement may be necessary to remove the biofilm after we start him on topical antibiotics. We are using silver alginate in the meantime. Also noteworthy is that the patient has lymphedema Integumentary (Hair, Skin) Wound #3 status is Open. Original cause of wound was Trauma. The wound is located on the Left,Lateral Foot. The wound measures 2.8cm length x 2.6cm width x 0.8cm depth; 5.718cm^2 area and 4.574cm^3 volume. There is Fat Layer (Subcutaneous Tissue) exposed. There is no tunneling noted, however, there is undermining starting at 10:00 and ending at 4:00 with a maximum distance of 0.6cm. There is a medium amount of serosanguineous drainage noted. The wound margin is thickened. There is large (67-100%) red, friable granulation within the wound bed. There is a small (1-33%) amount of necrotic tissue within the wound bed including Adherent Slough. 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 Bathing/ Shower/ Hygiene: May shower with protection but do not get wound dressing(s) wet. - may use cast protectors Edema Control - Lymphedema  / SCD / Other: Elevate legs to the level of the heart or above for 30 minutes daily and/or when sitting, a frequency of: - throughout the day Avoid standing for long periods of time. Exercise regularly Moisturize  legs daily. - both legs every night before bed. Compression stocking or Garment 20-30 mm/Hg pressure to: - Apply to both legs in the morning and remove at night. WOUND #3: - Foot Wound Laterality: Left, Lateral Cleanser: Soap and Water 1 x Per Day/30 Days Discharge Instructions: May shower and wash wound with dial antibacterial soap and water prior to dressing change. Peri-Wound Care: Zinc Oxide Ointment 30g tube 1 x Per Day/30 Days Discharge Instructions: Apply Zinc Oxide to periwound with each dressing change Peri-Wound Care: Sween Lotion (Moisturizing lotion) 1 x Per Day/30 Days Discharge Instructions: Apply moisturizing lotion as directed Topical: Triple Antibiotic Ointment, 1 (oz) Tube 1 x Per Day/30 Days Discharge Instructions: Apply under the alginate Ag until the keystone antibiotics arrive. Then apply keystone antibiotic to wound daily once arrives. Topical: keystone antibiotics 1 x Per Day/30 Days Discharge Instructions: apply topically once arrives to home. apply daily in place of Neosporin. Prim Dressing: KerraCel Ag Gelling Fiber Dressing, 4x5 in (silver alginate) (Generic) 1 x Per Day/30 Days ary Discharge Instructions: Apply silver alginate to wound bed as instructed Secondary Dressing: ABD Pad, 8x10 1 x Per Day/30 Days Discharge Instructions: T down before applying kerlix. Apply over primary dressing as directed. ape Secured With: [employer], 4.5x3.1 (in/yd) (Generic) 1 x Per Day/30 Days Discharge Instructions: Secure with Kerlix as directed. Secured With: 29M Medipore H Soft Cloth Surgical T 4 x 2 (in/yd) (Generic) 1 x Per Day/30 Days ape Discharge Instructions: Secure dressing with tape as directed. Add-Ons: felt 1 x Per Day/30 Days Discharge  Instructions: apply felt in shoe. 1. I am going to continue with silver alginate 2. We told him he has to get the stockings on and our nurse gave him some suggestions 3. T much drainage for a total contact cast oo 4. There was not an easy combination of oral antibiotics to address what came out of the PCR culture which was a deep tissue culture. This in itself was only low quantities but nevertheless I think the issue here is largely a biofilm issue Electronic Signature(s) Signed: 05/14/2020 5:13:40 PM By: [name] MD Entered By: [name] on 05/14/2020 08:50:15 -------------------------------------------------------------------------------- SuperBill Details Patient Name: Date of Service: [name], [name] 05/14/2020 Medical Record Number: [id number] Patient Account Number: [id number] Date of Birth/Sex: Treating RN: 14-Jul-1985 (35 y.o. [name] Primary Care Provider: [name] Other Clinician: Referring Provider: Treating Provider/Extender: [name] in Treatment: 29 Diagnosis Coding ICD-10 Codes Code Description E11.621 Type 2 diabetes mellitus with foot ulcer L97.528 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ity Facility Procedures CPT4 Code: [id number] Description: 99214 - WOUND CARE VISIT-LEV 4 EST PT Modifier: Quantity: 1 Physician Procedures : CPT4 Code Description Modifier [id number] - WC PHYS LEVEL 3 - EST PT ICD-10 Diagnosis Description E11.621 Type 2 diabetes mellitus with foot ulcer L97.528 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ity Quantity: 1 Electronic Signature(s) Signed: 05/14/2020 5:13:40 PM By: [name] MD Entered By: [name] on 05/14/2020 08:50:30

## 2020-05-14 NOTE — Progress Notes (Addendum)
[place], [name] ([id number]) Visit Report for 05/14/2020 Arrival Information Details Patient Name: Date of Service: [name] 05/14/2020 7:30 A M Medical Record Number: [id number] Patient Account Number: [id number] Date of Birth/Sex: Treating RN: 02-19-1986 (35 y.o. [name] Primary Care Provider: [name] Other Clinician: Referring Provider: Treating Provider/Extender: [name] in Treatment: 29 Visit Information History Since Last Visit Added or deleted any medications: No Patient Arrived: Ambulatory Any new allergies or adverse reactions: No Arrival Time: 07:56 Had a fall or experienced change in No Accompanied By: self activities of daily living that may affect Transfer Assistance: None risk of falls: Patient Identification Verified: Yes Signs or symptoms of abuse/neglect since No Secondary Verification Process Completed: Yes last visito Patient Requires Transmission-Based Precautions: No Hospitalized since last visit: No Patient Has Alerts: No Implantable device outside of the clinic No excluding cellular tissue based products placed in the center since last visit: Has Dressing in Place as Prescribed: Yes Has Footwear/Offloading in Place as Yes Prescribed: Left: Surgical Shoe with Pressure Relief Insole Pain Present Now: No Electronic Signature(s) Signed: 05/14/2020 5:55:42 PM By: Baruch Gouty RN, BSN Entered By: Baruch Gouty on 05/14/2020 08:00:43 -------------------------------------------------------------------------------- Clinic Level of Care Assessment Details Patient Name: Date of Service: [name] 05/14/2020 7:30 A M Medical Record Number: [id number] Patient Account Number: [id number] Date of Birth/Sex: Treating RN: 1985/10/23 (35 y.o. [name], Meta.Reding Primary Care Provider: [name] Other Clinician: Referring Provider: Treating Provider/Extender: [name] in  Treatment: 29 Clinic Level of Care Assessment Items TOOL 4 Quantity Score X- 1 0 Use when only an EandM is performed on FOLLOW-UP visit ASSESSMENTS - Nursing Assessment / Reassessment X- 1 10 Reassessment of Co-morbidities (includes updates in patient status) X- 1 5 Reassessment of Adherence to Treatment Plan ASSESSMENTS - Wound and Skin A ssessment / Reassessment [] - 0 Simple Wound Assessment / Reassessment - one wound X- 1 5 Complex Wound Assessment / Reassessment - multiple wounds X- 1 10 Dermatologic / Skin Assessment (not related to wound area) ASSESSMENTS - Focused Assessment X- 1 5 Circumferential Edema Measurements - multi extremities X- 1 10 Nutritional Assessment / Counseling / Intervention [] - 0 Lower Extremity Assessment (monofilament, tuning fork, pulses) [] - 0 Peripheral Arterial Disease Assessment (using hand held doppler) ASSESSMENTS - Ostomy and/or Continence Assessment and Care [] - 0 Incontinence Assessment and Management [] - 0 Ostomy Care Assessment and Management (repouching, etc.) PROCESS - Coordination of Care [] - 0 Simple Patient / Family Education for ongoing care X- 1 20 Complex (extensive) Patient / Family Education for ongoing care X- 1 10 Staff obtains Programmer, systems, Records, T Results / Process Orders est [] - 0 Staff telephones HHA, Nursing Homes / Clarify orders / etc [] - 0 Routine Transfer to another Facility (non-emergent condition) [] - 0 Routine Hospital Admission (non-emergent condition) [] - 0 New Admissions / Biomedical engineer / Ordering NPWT Apligraf, etc. , [] - 0 Emergency Hospital Admission (emergent condition) [] - 0 Simple Discharge Coordination X- 1 15 Complex (extensive) Discharge Coordination PROCESS - Special Needs [] - 0 Pediatric / Minor Patient Management [] - 0 Isolation Patient Management [] - 0 Hearing / Language / Visual special needs [] - 0 Assessment of Community assistance (transportation,  [name]/C planning, etc.) [] - 0 Additional assistance / Altered mentation [] - 0 Support Surface(s) Assessment (bed, cushion, seat, etc.) INTERVENTIONS - Wound Cleansing / Measurement [] - 0 Simple  Wound Cleansing - one wound X- 1 5 Complex Wound Cleansing - multiple wounds X- 1 5 Wound Imaging (photographs - any number of wounds) [] - 0 Wound Tracing (instead of photographs) [] - 0 Simple Wound Measurement - one wound X- 1 5 Complex Wound Measurement - multiple wounds INTERVENTIONS - Wound Dressings [] - 0 Small Wound Dressing one or multiple wounds X- 1 15 Medium Wound Dressing one or multiple wounds [] - 0 Large Wound Dressing one or multiple wounds X- 1 5 Application of Medications - topical [] - 0 Application of Medications - injection INTERVENTIONS - Miscellaneous [] - 0 External ear exam [] - 0 Specimen Collection (cultures, biopsies, blood, body fluids, etc.) [] - 0 Specimen(s) / Culture(s) sent or taken to Lab for analysis [] - 0 Patient Transfer (multiple staff / Civil Service fast streamer / Similar devices) [] - 0 Simple Staple / Suture removal (25 or less) [] - 0 Complex Staple / Suture removal (26 or more) [] - 0 Hypo / Hyperglycemic Management (close monitor of Blood Glucose) [] - 0 Ankle / Brachial Index (ABI) - do not check if billed separately X- 1 5 Vital Signs Has the patient been seen at the hospital within the last three years: Yes Total Score: 130 Level Of Care: New/Established - Level 4 Electronic Signature(s) Signed: 05/14/2020 6:08:01 PM By: Deon Pilling Entered By: Deon Pilling on 05/14/2020 08:48:43 -------------------------------------------------------------------------------- Encounter Discharge Information Details Patient Name: Date of Service: [name], [name] 05/14/2020 7:30 A M Medical Record Number: [id number] Patient Account Number: [id number] Date of Birth/Sex: Treating RN: 30-Jun-1985 (35 y.o. [name] Primary Care Provider:  [name] Other Clinician: Referring Provider: Treating Provider/Extender: [name] in Treatment: 29 Encounter Discharge Information Items Discharge Condition: Stable Ambulatory Status: Ambulatory Discharge Destination: Home Transportation: Private Auto Accompanied By: self Schedule Follow-up Appointment: Yes Clinical Summary of Care: Patient Declined Electronic Signature(s) Signed: 05/14/2020 5:55:42 PM By: Baruch Gouty RN, BSN Entered By: Baruch Gouty on 05/14/2020 08:43:01 -------------------------------------------------------------------------------- Lower Extremity Assessment Details Patient Name: Date of Service: [name], [name] 05/14/2020 7:30 A M Medical Record Number: [id number] Patient Account Number: [id number] Date of Birth/Sex: Treating RN: 05-01-1985 (35 y.o. [name] Primary Care Provider: [name] Other Clinician: Referring Provider: Treating Provider/Extender: [name] in Treatment: 29 Edema Assessment Assessed: [name]: No] [name]: No] Edema: [Left: Ye] [Right: s] Calf Left: Right: Point of Measurement: 48 cm From Medial Instep 52.5 cm Ankle Left: Right: Point of Measurement: 10 cm From Medial Instep 36.6 cm Vascular Assessment Pulses: Dorsalis Pedis Palpable: [Left:No] Electronic Signature(s) Signed: 05/14/2020 5:55:42 PM By: Baruch Gouty RN, BSN Entered By: Baruch Gouty on 05/14/2020 08:15:51 -------------------------------------------------------------------------------- Multi Wound Chart Details Patient Name: Date of Service: [name], [name] 05/14/2020 7:30 A M Medical Record Number: [id number] Patient Account Number: [id number] Date of Birth/Sex: Treating RN: Sep 10, 1985 (35 y.o. [name] Primary Care Provider: [name] Other Clinician: Referring Provider: Treating Provider/Extender: [name] in Treatment:  29 Vital Signs Height(in): 77 Capillary Blood Glucose(mg/dl): 130 Weight(lbs): 280 Pulse(bpm): 94 Body Mass Index(BMI): 33 Blood Pressure(mmHg): 131/87 Temperature(F): 98.5 Respiratory Rate(breaths/min): 18 Photos: [3:No Photos Left, Lateral Foot] [N/A:N/A N/A] Wound Location: [3:Trauma] [N/A:N/A] Wounding Event: [3:Diabetic Wound/Ulcer of the Lower] [N/A:N/A] Primary Etiology: [3:Extremity Type II Diabetes] [N/A:N/A] Comorbid History: [3:10/02/2019] [N/A:N/A] Date Acquired: [3:29] [N/A:N/A] Weeks of Treatment: [3:Open] [N/A:N/A] Wound Status: [3:2.8x2.6x0.8] [N/A:N/A] Measurements L x W x [name] [3:5.718] [  N/A:N/A] A (cm) : rea [3:4.574] [N/A:N/A] Volume (cm) : [3:-246.80%] [N/A:N/A] % Reduction in A rea: [3:-2672.10%] [N/A:N/A] % Reduction in Volume: [3:10] Starting Position 1 (o'clock): [3:4] Ending Position 1 (o'clock): [3:0.6] Maximum Distance 1 (cm): [3:Yes] [N/A:N/A] Undermining: [3:Grade 2] [N/A:N/A] Classification: [3:Medium] [N/A:N/A] Exudate A mount: [3:Serosanguineous] [N/A:N/A] Exudate Type: [3:red, brown] [N/A:N/A] Exudate Color: [3:Thickened] [N/A:N/A] Wound Margin: [3:Large (67-100%)] [N/A:N/A] Granulation A mount: [3:Red, Friable] [N/A:N/A] Granulation Quality: [3:Small (1-33%)] [N/A:N/A] Necrotic A mount: [3:Fat Layer (Subcutaneous Tissue): Yes N/A] Exposed Structures: [3:Fascia: No Tendon: No Muscle: No Joint: No Bone: No Small (1-33%)] [N/A:N/A] Treatment Notes Wound #3 (Foot) Wound Laterality: Left, Lateral Cleanser Soap and Water Discharge Instruction: May shower and wash wound with dial antibacterial soap and water prior to dressing change. Peri-Wound Care Zinc Oxide Ointment 30g tube Discharge Instruction: Apply Zinc Oxide to periwound with each dressing change Sween Lotion (Moisturizing lotion) Discharge Instruction: Apply moisturizing lotion as directed Topical Triple Antibiotic Ointment, 1 (oz) Tube Discharge Instruction: Apply under  the alginate Ag until the keystone antibiotics arrive. Then apply keystone antibiotic to wound daily once arrives. keystone antibiotics Discharge Instruction: apply topically once arrives to home. apply daily in place of Neosporin. Primary Dressing KerraCel Ag Gelling Fiber Dressing, 4x5 in (silver alginate) Discharge Instruction: Apply silver alginate to wound bed as instructed Secondary Dressing ABD Pad, 8x10 Discharge Instruction: T down before applying kerlix. Apply over primary dressing as directed. ape Secured With [employer], 4.5x3.1 (in/yd) Discharge Instruction: Secure with Kerlix as directed. 17M Medipore H Soft Cloth Surgical T 4 x 2 (in/yd) ape Discharge Instruction: Secure dressing with tape as directed. Compression Wrap Compression Stockings Add-Ons felt Discharge Instruction: apply felt in shoe. Electronic Signature(s) Signed: 05/14/2020 5:13:40 PM By: [name] MD Signed: 05/14/2020 6:08:01 PM By: Deon Pilling Entered By: [name] on 05/14/2020 08:46:25 -------------------------------------------------------------------------------- Multi-Disciplinary Care Plan Details Patient Name: Date of Service: [name], [name] 05/14/2020 7:30 A M Medical Record Number: [id number] Patient Account Number: [id number] Date of Birth/Sex: Treating RN: 1986/01/30 (35 y.o. [name] Primary Care Leamon Palau: [name] Other Clinician: Referring Amilcar Reever: Treating Orpheus [place]/Extender: [name] in Treatment: 29 Active Inactive Nutrition Nursing Diagnoses: Imbalanced nutrition Potential for alteratiion in Nutrition/Potential for imbalanced nutrition Goals: Patient/caregiver agrees to and verbalizes understanding of need to use nutritional supplements and/or vitamins as prescribed Date Initiated: 10/24/2019 Date Inactivated: 04/06/2020 Target Resolution Date: 04/03/2020 Goal Status: Met Patient/caregiver will maintain  therapeutic glucose control Date Initiated: 10/24/2019 Target Resolution Date: 06/05/2020 Goal Status: Active Interventions: Assess HgA1c results as ordered upon admission and as needed Assess patient nutrition upon admission and as needed per policy Provide education on elevated blood sugars and impact on wound healing Provide education on nutrition Treatment Activities: Education provided on Nutrition : 04/28/2020 Notes: Wound/Skin Impairment Nursing Diagnoses: Impaired tissue integrity Knowledge deficit related to ulceration/compromised skin integrity Goals: Patient/caregiver will verbalize understanding of skin care regimen Date Initiated: 10/24/2019 Target Resolution Date: 06/06/2020 Goal Status: Active Ulcer/skin breakdown will have a volume reduction of 30% by week 4 Date Initiated: 10/24/2019 Date Inactivated: 01/16/2020 Target Resolution Date: 01/10/2020 Unmet Reason: no change in Goal Status: Unmet measurements. Interventions: Assess patient/caregiver ability to obtain necessary supplies Assess patient/caregiver ability to perform ulcer/skin care regimen upon admission and as needed Assess ulceration(s) every visit Provide education on ulcer and skin care Notes: Electronic Signature(s) Signed: 05/14/2020 6:08:01 PM By: Deon Pilling Entered By: Deon Pilling on 05/14/2020 08:04:31 -------------------------------------------------------------------------------- Pain Assessment Details Patient Name: Date of  Service: [name] 05/14/2020 7:30 A M Medical Record Number: [id number] Patient Account Number: [id number] Date of Birth/Sex: Treating RN: 1986/03/28 (35 y.o. [name] Primary Care Provider: [name] Other Clinician: Referring Provider: Treating Provider/Extender: [name] in Treatment: 29 Active Problems Location of Pain Severity and Description of Pain Patient Has Paino No Site Locations Rate the pain. Rate  the pain. Current Pain Level: 0 Pain Management and Medication Current Pain Management: Electronic Signature(s) Signed: 05/14/2020 5:55:42 PM By: Baruch Gouty RN, BSN Entered By: Baruch Gouty on 05/14/2020 08:15:12 -------------------------------------------------------------------------------- Patient/Caregiver Education Details Patient Name: Date of Service: [name], [name] 2/10/2022andnbsp7:30 A M Medical Record Number: [id number] Patient Account Number: [id number] Date of Birth/Gender: Treating RN: [birth year] (35 y.o. [name] Primary Care Physician: [name] Other Clinician: Referring Physician: Treating Physician/Extender: [name] in Treatment: 29 Education Assessment Education Provided To: Patient Education Topics Provided Elevated Blood Sugar/ Impact on Healing: Handouts: Elevated Blood Sugars: How Do They Affect Wound Healing Methods: Explain/Verbal Responses: Reinforcements needed Electronic Signature(s) Signed: 05/14/2020 6:08:01 PM By: Deon Pilling Entered By: Deon Pilling on 05/14/2020 08:04:41 -------------------------------------------------------------------------------- Wound Assessment Details Patient Name: Date of Service: [name] 05/14/2020 7:30 A M Medical Record Number: [id number] Patient Account Number: [id number] Date of Birth/Sex: Treating RN: 17-Aug-1985 (35 y.o. [name] Primary Care Provider: [name] Other Clinician: Referring Provider: Treating Provider/Extender: [name] in Treatment: 29 Wound Status Wound Number: 3 Primary Etiology: Diabetic Wound/Ulcer of the Lower Extremity Wound Location: Left, Lateral Foot Wound Status: Open Wounding Event: Trauma Comorbid History: Type II Diabetes Date Acquired: 10/02/2019 Weeks Of Treatment: 29 Clustered Wound: No Photos Wound Measurements Length: (cm) 2.8 Width: (cm) 2.6 Depth: (cm)  0.8 Area: (cm) 5.718 Volume: (cm) 4.574 % Reduction in Area: -246.8% % Reduction in Volume: -2672.1% Epithelialization: Small (1-33%) Tunneling: No Undermining: Yes Starting Position (o'clock): 10 Ending Position (o'clock): 4 Maximum Distance: (cm) 0.6 Wound Description Classification: Grade 2 Wound Margin: Thickened Exudate Amount: Medium Exudate Type: Serosanguineous Exudate Color: red, brown Foul Odor After Cleansing: No Slough/Fibrino No Wound Bed Granulation Amount: Large (67-100%) Exposed Structure Granulation Quality: Red, Friable Fascia Exposed: No Necrotic Amount: Small (1-33%) Fat Layer (Subcutaneous Tissue) Exposed: Yes Necrotic Quality: Adherent Slough Tendon Exposed: No Muscle Exposed: No Joint Exposed: No Bone Exposed: No Treatment Notes Wound #3 (Foot) Wound Laterality: Left, Lateral Cleanser Soap and Water Discharge Instruction: May shower and wash wound with dial antibacterial soap and water prior to dressing change. Peri-Wound Care Zinc Oxide Ointment 30g tube Discharge Instruction: Apply Zinc Oxide to periwound with each dressing change Sween Lotion (Moisturizing lotion) Discharge Instruction: Apply moisturizing lotion as directed Topical Triple Antibiotic Ointment, 1 (oz) Tube Discharge Instruction: Apply under the alginate Ag until the keystone antibiotics arrive. Then apply keystone antibiotic to wound daily once arrives. keystone antibiotics Discharge Instruction: apply topically once arrives to home. apply daily in place of Neosporin. Primary Dressing KerraCel Ag Gelling Fiber Dressing, 4x5 in (silver alginate) Discharge Instruction: Apply silver alginate to wound bed as instructed Secondary Dressing ABD Pad, 8x10 Discharge Instruction: T down before applying kerlix. Apply over primary dressing as directed. ape Secured With [employer], 4.5x3.1 (in/yd) Discharge Instruction: Secure with Kerlix as directed. 71M Medipore H Soft  Cloth Surgical T 4 x 2 (in/yd) ape Discharge Instruction: Secure dressing with tape as directed. Compression Wrap Compression Stockings Add-Ons felt Discharge Instruction: apply felt in shoe.  Electronic Signature(s) Signed: 05/18/2020 2:23:11 PM By: [name] EMT/HBOT/SD Signed: 05/19/2020 5:47:53 PM By: Baruch Gouty RN, BSN Previous Signature: 05/14/2020 5:55:42 PM Version By: Baruch Gouty RN, BSN Entered By: [name] on 05/18/2020 13:34:08 -------------------------------------------------------------------------------- Vitals Details Patient Name: Date of Service: [name], [name] 05/14/2020 7:30 A M Medical Record Number: [id number] Patient Account Number: [id number] Date of Birth/Sex: Treating RN: 15-Apr-1985 (35 y.o. [name] Primary Care Provider: [name] Other Clinician: Referring Provider: Treating Provider/Extender: [name] in Treatment: 29 Vital Signs Time Taken: 08:03 Temperature (F): 98.5 Height (in): 77 Pulse (bpm): 94 Source: Stated Respiratory Rate (breaths/min): 18 Weight (lbs): 280 Blood Pressure (mmHg): 131/87 Source: Stated Capillary Blood Glucose (mg/dl): 130 Body Mass Index (BMI): 33.2 Reference Range: 80 - 120 mg / dl Notes glucose per pt report this am Electronic Signature(s) Signed: 05/14/2020 5:55:42 PM By: Baruch Gouty RN, BSN Entered By: Baruch Gouty on 05/14/2020 08:15:04

## 2020-05-21 ENCOUNTER — Encounter (HOSPITAL_BASED_OUTPATIENT_CLINIC_OR_DEPARTMENT_OTHER): Payer: BC Managed Care – PPO | Admitting: Internal Medicine

## 2020-05-21 ENCOUNTER — Other Ambulatory Visit: Payer: Self-pay

## 2020-05-21 DIAGNOSIS — E11621 Type 2 diabetes mellitus with foot ulcer: Secondary | ICD-10-CM | POA: Diagnosis not present

## 2020-05-21 NOTE — Progress Notes (Signed)
[name] ([id number]) Visit Report for 05/21/2020 Debridement Details Patient Name: Date of Service: [name] 05/21/2020 7:30 A M Medical Record Number: [id number] Patient Account Number: [id number] Date of Birth/Sex: Treating RN: 08/26/1985 (35 y.o. [name] Primary Care Provider: [name] Other Clinician: Referring Provider: Treating Provider/Extender: [name] in Treatment: 30 Debridement Performed for Assessment: Wound #3 Left,Lateral Foot Performed By: Physician [name], MD Debridement Type: Debridement Severity of Tissue Pre Debridement: Fat layer exposed Level of Consciousness (Pre-procedure): Awake and Alert Pre-procedure Verification/Time Out Yes - 07:59 Taken: Start Time: 08:00 Pain Control: Other : benzocaine 20% T Area Debrided (L x W): otal 2.7 (cm) x 2.5 (cm) = 6.75 (cm) Tissue and other material debrided: Viable, Non-Viable, Callus, Slough, Subcutaneous, Skin: Dermis , Skin: Epidermis, Fibrin/Exudate, Slough Level: Skin/Subcutaneous Tissue Debridement Description: Excisional Instrument: Curette Bleeding: Moderate Hemostasis Achieved: Pressure End Time: 08:03 Procedural Pain: 0 Post Procedural Pain: 0 Response to Treatment: Procedure was tolerated well Level of Consciousness (Post- Awake and Alert procedure): Post Debridement Measurements of Total Wound Length: (cm) 2.7 Width: (cm) 2.5 Depth: (cm) 0.9 Volume: (cm) 4.771 Character of Wound/Ulcer Post Debridement: Improved Severity of Tissue Post Debridement: Fat layer exposed Post Procedure Diagnosis Same as Pre-procedure Electronic Signature(s) Signed: 05/21/2020 5:29:55 PM By: [name] MD Signed: 05/21/2020 5:51:10 PM By: Deon Pilling Entered By: [name] on 05/21/2020 08:08:54 -------------------------------------------------------------------------------- HPI Details Patient Name: Date of Service: [name], [name]  05/21/2020 7:30 A M Medical Record Number: [id number] Patient Account Number: [id number] Date of Birth/Sex: Treating RN: 06/25/1985 (35 y.o. 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 He comes in for review of a wound over the plantar fifth metatarsal head extending into the lateral part of the foot. He was followed for this previously by his podiatrist Dr. Cornelius Moras. As the patient tells his story he went to see podiatry first for a swelling he developed on the lateral part of his fifth metatarsal head in May. He states this was "open" by podiatry and the area closed. He was followed up in June and it was again opened callus removed and it closed promptly. There were plans being made for surgery on the fifth metatarsal head in June however his blood sugar was apparently too high for anesthesia. Apparently the area was debrided and opened again in June and it is never closed since. Looking over the records from podiatry I am really not able to follow this. It was clear when he was first seen it was before 5/14 at that point he already had a wound. By 5/17 the ulcer was resolved. I do not see anything about a procedure. On 5/28 noted to have pre-ulcerative moderate keratosis. X-ray noted 1/5 contracted toe and tailor's bunion and metatarsal deformity. On a visit date on 09/28/2018 the dorsal part of the left foot it healed and resolved. There was concern about swelling in his lower extremity he was sent to the ER.. As far as I can tell he was seen in the ER on 7/12 with an ulcer on his left foot. A DVT rule out of the left leg was negative. I do not think I have complete records from podiatry but I am not able to verify the  procedures  this patient states he had. He states after the last procedure the wound has never closed although I am not able to follow this in the records I have from podiatry. He has not had a recent x-ray The patient has been using Neosporin on the wound. He is wearing a Darco shoe. He is still very active up on his foot working and exercising. Past medical history; type 2 diabetes ketosis-prone, leg swelling with a negative DVT study in July. Non-smoker ABI in our clinic was 0.85 on the left 10/16; substantial wound on the plantar left fifth met head extending laterally almost to the dorsal fifth MTP. We have been using silver alginate we gave him a Darco forefoot off loader. An x-ray did not show evidence of osteomyelitis did note soft tissue emphysema which I think was due to gas tracking through an open wound. There is no doubt in my mind he requires an MRI 10/23; MRI not booked until 3 November at the earliest this is largely due to his glucose sensor in the right arm. We have been using silver alginate. There has been an improvement 10/29; I am still not exactly sure when his MRI is booked for. He says it is the third but it is the 10th in epic. This definitely needs to be done. He is running a low-grade fever today but no other symptoms. No real improvement in the 1 02/26/2019 patient presents today for a follow-up visit here in our clinic he is last been seen in the clinic on October 29. Subsequently we were working on getting MRI to evaluate and see what exactly was going on and where we would need to go from the standpoint of whether or not he had osteomyelitis and again what treatments were going be required. Subsequently the patient ended up being admitted to the hospital on 02/07/2019 and was discharged on 02/14/2019. This is a somewhat interesting admission with a discharge diagnosis of pneumonia due to COVID-19 although he was positive for COVID-19 when tested at the urgent care but  negative x2 when he was actually in the hospital. With that being said he did have acute respiratory failure with hypoxia and it was noted he also have a left foot ulceration with osteomyelitis. With that being said he did require oxygen for his pneumonia and I level 4 L. He was placed on antivirals and steroids for the COVID-19. He was also transferred to the [facility] at one point. Nonetheless he did subsequently discharged home and since being home has done much better in that regard. The CT angiogram did not show any pulmonary embolism. With regard to the osteomyelitis the patient was placed on vancomycin and Zosyn while in the hospital but has been changed to Augmentin at discharge. It was also recommended that he follow- up with wound care and podiatry. Podiatry however wanted him to see [name] according to the patient prior to them doing anything further. His hemoglobin A1c was 9.9 as noted in the hospital. Have an MRI of the left foot performed while in the hospital on 02/04/2019. 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 There is an overlying plantar open wound noted an abscess tracking back along the lateral aspect of the fifth metatarsal shaft. There is otherwise diffuse cellulitis and mild fasciitis without findings of polymyositis. The patient did have recently pneumonia secondary to COVID-19 I looked in the chart through epic and it  does appear that the patient may need to have an additional x-ray just to ensure everything is cleared and that he has no airspace disease prior to putting him into the [name]. 03/05/2019; patient was readmitted to the clinic last week. He was hospitalized twice for a viral upper respiratory tract infection from 11/1 through 11/4 and then 11/5 through 11/12 ultimately this turned out to be Covid pneumonitis. Although he was discharged on oxygen he is not using  it. He says he feels fine. He has no exercise limitation no cough no sputum. His O2 sat in our clinic today was 100% on room air. 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 He received Vanco and Zosyn in the hospital and then was discharged on 2 weeks of Augmentin. I do not see any relevant cultures. He was supposed to follow-up with infectious disease but I do not see that he has an appointment. 12/8; patient saw Dr. Novella Olive of infectious disease last week. He felt that he had had adequate antibiotic therapy. He did not go to follow-up with Dr. Amalia Hailey of podiatry and I have again talked to him about the pros and cons of this. He does not want to consider a ray amputation of this time. He is aware of the risks of recurrence, migration etc. He started HBO today and tolerated this well. He can complete the Augmentin that I gave him last week. I have looked over the lab work that Dr. Chana Bode ordered his C-reactive protein was 3.3 and his sedimentation rate was 17. The C-reactive protein is never really been measurably that high in this patient 12/15; not much change in the wound today however he has undermining along the lateral part of the foot again more extensively than last week. He has some rims of epithelialization. We have been using silver alginate. He is undergoing hyperbarics but did not dive today 12/18; in for his obligatory first total contact cast change. Unfortunately there was pus coming from the undermining area around his fifth metatarsal head. This was cultured but will preclude reapplication of a cast. He is seen in conjunction with HBO 12/24; patient had staph lugdunensis in the wound in the undermining area laterally last time. We put him on doxycycline which should have covered this. The wound looks better today. I am going to give him another week of doxycycline before  reattempting the total contact cast 12/31; the patient is completing antibiotics. Hemorrhagic debris in the distal part of the wound with some undermining distally. He also had hyper granulation. Extensive debridement with a #5 curette. The infected area that was on the lateral part of the fifth met head is closed over. I do not think he needs any more antibiotics. Patient was seen prior to HBO. Preparations for a total contact cast were made in the cast will be placed post hyperbarics 04/11/19; once again the patient arrives today without complaint. He had been in a cast all week noted that he had heavy drainage this week. This resulted in large raised areas of macerated tissue around the wound 1/14; wound bed looks better slightly smaller. Hydrofera Blue has been changing himself. He had a heavy drainage last week which caused a lot of maceration around the wound so I took him out of a total contact cast he says the drainage is actually better this week He is seen today in conjunction with HBO  1/21; returns to clinic. He was up in [place] for a day or 2 attending a funeral. He comes back in with the wound larger and with a large area of exposed bone. He had osteomyelitis and septic arthritis of the fifth left metatarsal head while he was in hospital. He received IV antibiotics in the hospital for a prolonged period of time then 3 weeks of Augmentin. Subsequently I gave him 2 weeks of doxycycline for more superficial wound infection. When I saw this last week the wound was smaller the surface of the wound looks satisfactory. 1/28; patient missed hyperbarics today. Bone biopsy I did last time showed Enterococcus faecalis and Staphylococcus lugdunensis . He has a wide area of exposed bone. We are going to use silver alginate as of today. I had another ethical discussion with the patient. 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 In this situation I think  the likelihood of healing this is low. Therefore I have recommended a ray amputation and with the patient's agreement I have referred him to Dr. Doran Durand. The other issue is that his compliance with hyperbarics has been minimal because of his work schedule and given his underlying decision I am going to stop this today READMISSION 10/24/2019 MRI 09/29/2019 left foot IMPRESSION: 1. Apparent skin ulceration inferior and lateral to the 5th metatarsal base with underlying heterogeneous T2 signal and enhancement in the subcutaneous fat. Small peripherally enhancing fluid collections along the plantar and lateral aspects of the 5th metatarsal base suspicious for abscesses. 2. Interval amputation through the mid 5th metatarsal with nonspecific low-level marrow edema and enhancement. Given the proximity to the adjacent soft tissue inflammatory changes, osteomyelitis cannot be excluded. 3. The additional bones appear unremarkable. MRI 09/29/2019 right foot IMPRESSION: 1. Soft tissue ulceration lateral to the 5th MTP joint. There is low-level T2 hyperintensity within the 4th and 5th metatarsal heads and adjacent proximal phalanges without abnormal T1 signal or cortical destruction. These findings are nonspecific and could be seen with early marrow edema, hyperemia or early osteomyelitis. No evidence of septic joint. 2. Mild tenosynovitis and synovial enhancement associated with the extensor digitorum tendons at the level of the midfoot. 3. Diffuse low-level muscular T2 hyperintensity and enhancement, most consistent with diabetic myopathy. LEFT FOOT BONE Methicillin resistant staphylococcus aureus Staphylococcus lugdunensis MIC MIC CIPROFLOXACIN >=8 RESISTANT Resistant <=0.5 SENSI... Sensitive CLINDAMYCIN <=0.25 SENS... Sensitive >=8 RESISTANT Resistant ERYTHROMYCIN >=8 RESISTANT Resistant >=8 RESISTANT Resistant GENTAMICIN <=0.5 SENSI... Sensitive <=0.5 SENSI... Sensitive Inducible Clindamycin  NEGATIVE Sensitive NEGATIVE Sensitive OXACILLIN >=4 RESISTANT Resistant 2 SENSITIVE Sensitive RIFAMPIN <=0.5 SENSI... Sensitive <=0.5 SENSI... Sensitive TETRACYCLINE <=1 SENSITIVE Sensitive <=1 SENSITIVE Sensitive TRIMETH/SULFA <=10 SENSIT Sensitive <=10 SENSIT Sensitive ... Marland Kitchen.. VANCOMYCIN 1 SENSITIVE Sensitive <=0.5 SENSI... Sensitive Right foot bone . Component 3 wk ago Specimen Description BONE Special Requests RIGHT 4 METATARSAL SAMPLE B Gram Stain NO WBC SEEN NO ORGANISMS SEEN Culture RARE METHICILLIN RESISTANT STAPHYLOCOCCUS AUREUS NO ANAEROBES ISOLATED Performed at [facility], [place] [address] [place], [place] [address] Report Status 10/08/2019 FINAL Organism ID, Bacteria METHICILLIN RESISTANT STAPHYLOCOCCUS AUREUS Resulting Agency [facility] Susceptibility Methicillin resistant staphylococcus aureus MIC CIPROFLOXACIN >=8 RESISTANT Resistant CLINDAMYCIN <=0.25 SENS... Sensitive ERYTHROMYCIN >=8 RESISTANT Resistant GENTAMICIN <=0.5 SENSI... Sensitive Inducible Clindamycin NEGATIVE Sensitive OXACILLIN >=4 RESISTANT Resistant RIFAMPIN <=0.5 SENSI... Sensitive TETRACYCLINE <=1 SENSITIVE Sensitive TRIMETH/SULFA <=10 SENSIT Sensitive ... VANCOMYCIN 1 SENSITIVE Sensitive This is a patient we had in clinic earlier this year with a wound over his left fifth  metatarsal head. 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 Doran Durand on 05/16/2019. He seems to have developed problems with wounds on his bilateral feet in June 2021 although he may have had problems earlier than this. He was in an urgent care with a right foot ulcer on 09/26/2019 and given a course of doxycycline. This was apparently after  having trouble getting into see orthopedics. He was seen by podiatry on 09/28/2019 noted to have bilateral lower extremity ulcers including the left lateral fifth metatarsal base and the right subfifth met head. It was noted that had purulent drainage at that time. He required hospitalization from 6/20 through 7/2. This was because of worsening right foot wounds. He underwent bilateral operative incision and drainage and bone biopsies bilaterally. Culture results are listed above. He has been referred back to clinic by Dr. Jacqualyn Posey of podiatry. He is also followed by Dr. Megan Salon who saw him yesterday. He was discharged from hospital on Zyvox Flagyl and Levaquin and yesterday changed to doxycycline Flagyl and Levaquin. His inflammatory markers on 6/26 showed a sedimentation rate of 129 and a C-reactive protein of 5. This is improved to 14 and 1.3 respectively. This would indicate improvement. ABIs in our clinic today were 1.23 on the right and 1.20 on the left 11/01/2019 on evaluation today patient appears to be doing fairly well in regard to the wounds on his feet at this point. Fortunately there is no signs of active infection at this time. No fevers, chills, nausea, vomiting, or diarrhea. He currently is seeing infectious disease and still under their care at this point. Subsequently he also has both wounds which she has not been using collagen on as he did not receive that in his packaging he did not call us and let us know that. Apparently that just was missed on the order. Nonetheless we will get that straightened out today. 8/9-Patient returns for bilateral foot wounds, using Prisma with hydrogel moistened dressings, and the wounds appear stable. Patient using surgical shoes, avoiding much pressure or weightbearing as much as possible 8/16; patient has bilateral foot wounds. 1 on the right lateral foot proximally the other is on the left mid lateral foot. Both required debridement of callus  and thick skin around the wounds. We have been using silver collagen 8/27; patient has bilateral lateral foot wounds. The area on the left substantially surrounded by callus and dry skin. This was removed from the wound edge. The underlying wound is small. The area on the right measured somewhat smaller today. 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 Unfortunately I did not update his antibiotics during today's visit. 9/10 I reviewed Dr. Hale Bogus last notes he felt he had completed antibiotics his inflammatory markers were reasonably well controlled. He has a small wound on the lateral left foot and a tiny area on the right which is just above closed. He is using Hydrofera Blue with border foam he has bilateral surgical shoes 9/24; 2 week f/u. doing well. right foot is closed. left foot still undermined. 10/14; right foot remains closed at the fifth met head. The area over the base of the left fifth metatarsal has a small open area but considerable undermining  towards the plantar foot. Thick callus skin around this suggests an adequate pressure relief. We have talked about this. He says he is going to go back into his cam boot. I suggested a total contact cast he did not seem enamored with this suggestion 10/26; left foot base of the fifth metatarsal. Same condition as last time. He has skin over the area with an open wound however the skin is not adherent. He went to see Dr. Earleen Newport who did an x-ray and culture of his foot I have not reviewed the x-ray but the patient was not told anything. He is on doxycycline 11/11; since the patient was last here he was in the emergency room on 10/30 he was concerned about swelling in the left foot. They did not do any cultures or x-rays. They changed his antibiotics to cephalexin. Previous culture showed group B strep. The cephalexin is appropriate as doxycycline has less than predictable  coverage. Arrives in clinic today with swelling over this area under the wound. He also has a new wound on the right fifth metatarsal head 11/18; the patient has a difficult wound on the lateral aspect of the left fifth metatarsal head. The wound was almost ballotable last week I opened it slightly expecting to see purulence however there was just bleeding. I cultured this this was negative. X-ray unchanged. We are trying to get an MRI but I am not sure were going to be able to get this through his insurance. He also has an area on the right lateral fifth metatarsal head this looks healthier 12/3; the patient finally got our MRI. Surprisingly this did not show osteomyelitis. I did show the soft tissue ulceration at the lateral plantar aspect of the fifth metatarsal base with a tiny residual 6 mm abscess overlying the superficial fascia I have tried to culture this area I have not been able to get this to grow anything. Nevertheless the protruding tissue looks aggravated. I suspect we should try to treat the underlying "abscess with broad-spectrum antibiotics. I am going to start him on Levaquin and Flagyl. He has much less edema in his legs and I am going to continue to wrap his legs and see him weekly 12/10. I started Levaquin and Flagyl on him last week. He just picked up the Flagyl apparently there was some delay. The worry is the wound on the left fifth metatarsal base which is substantial and worsening. His foot looks like he inverts at the ankle making this a weightbearing surface. Certainly no improvement in fact I think the measurements of this are somewhat worse. We have been using 12/17; he apparently just got the Levaquin yesterday this is 2 weeks after the fact. He has completed the Flagyl. The area over the left fifth metatarsal base still has protruding granulation tissue although it does not look quite as bad as it did some weeks ago. He has severe bilateral lymphedema although we  have not been treating him for wounds on his legs this is definitely going to require compression. There was so much edema in the left I did not wish to put him in a total contact cast today. I am going to increase his compression from 3-4 layer. The area on the right lateral fifth met head actually look quite good and superficial. 12/23; patient arrived with callus on the right fifth met head and the substantial hyper granulated callused wound on the base of his fifth metatarsal. He says he is completing his Levaquin in 2  days but I do not think that adds up with what I gave him but I will have to double check this. We are using Hydrofera Blue on both areas. My plan is to put the left leg in a cast the week after New Year's 04/06/2020; patient's wounds about the same. Right lateral fifth metatarsal head and left lateral foot over the base of the fifth metatarsal. There is undermining on the left lateral foot which I removed before application of total contact cast continuing with Hydrofera Blue new. Patient tells me he was seen by endocrinology today lab work was done [Dr. Kerr]. Also wondering whether he was referred to cardiology. I went over some lab work from previously does not have chronic renal failure certainly not nephrotic range proteinuria he does have very poorly controlled diabetes but this is not his most updated lab work. Hemoglobin A1c has been over 11 1/10; the patient had a considerable amount of leakage towards mid part of his left foot with macerated skin however the wound surface looks better the area on the right lateral fifth met head is better as well. I am going to change the dressing on the left foot under the total contact cast to silver alginate, continue with Hydrofera Blue on the right. 1/20; patient was in the total contact cast for 10 days. Considerable amount of drainage although the skin around the wound does not look too bad on the left foot. The area on the right  fifth metatarsal head is closed. Our nursing staff reports large amount of drainage out of the left lateral foot wound 1/25; continues with copious amounts of drainage described by our intake staff. PCR culture I did last week showed E. coli and Enterococcus faecalis and low quantities. Multiple resistance genes documented including extended spectrum beta lactamase, MRSA, MRSE, quinolone, tetracycline. The wound is not quite as good this week as it was 5 days ago but about the same size 2/3; continues with copious amounts of malodorous drainage per our intake nurse. The PCR culture I did 2 weeks ago showed E. coli and low quantities of Enterococcus. There were multiple resistance genes detected. I put Neosporin on him last week although this does not seem to have helped. The wound is slightly deeper today. Offloading continues to be an issue here although with the amount of drainage she has a total contact cast is just not going to work 2/10; moderate amount of drainage. Patient reports he cannot get his stocking on over the dressing. I told him we have to do that the nurse gave him suggestions on how to make this work. The wound is on the bottom and lateral part of his left foot. Is cultured predominantly grew low amounts of Enterococcus, E. coli and anaerobes. There were multiple resistance genes detected including extended spectrum beta lactamase, quinolone, tetracycline. I could not think of an easy oral combination to address this so for now I am going to do topical antibiotics provided by [facility] I think the main agents here are vancomycin and an aminoglycoside. We have to be able to give him access to the wounds to get the topical antibiotic on 2/17; moderate amount of drainage this is unchanged. He has his Keystone topical antibiotic against the deep tissue culture organisms. He has been using this and changing the dressing daily. Silver alginate on the wound surface. Electronic  Signature(s) Signed: 05/21/2020 5:29:55 PM By: [name] MD Entered By: [name] on 05/21/2020 08:09:33 -------------------------------------------------------------------------------- Physical Exam Details Patient  Name: Date of Service: [name] 05/21/2020 7:30 A M Medical Record Number: [id number] Patient Account Number: [id number] Date of Birth/Sex: Treating RN: [birth year] (35 y.o. [name] Primary Care Provider: [name] Other Clinician: Referring Provider: Treating Provider/Extender: [name] in Treatment: 30 Constitutional Sitting or standing Blood Pressure is within target range for patient.. Pulse regular and within target range for patient.Marland Kitchen Respirations regular, non-labored and within target range.. Temperature is normal and within the target range for the patient.Marland Kitchen Appears in no distress. Notes Wound exam; left lateral fifth metatarsal base. Wound looks about the same. Mechanical debridement with a #5 curette removing subcutaneous tissue from the wound surface thick callus skin from the wound margin hemostasis with silver nitrate and a pressure dressing Electronic Signature(s) Signed: 05/21/2020 5:29:55 PM By: [name] MD Entered By: [name] on 05/21/2020 08:10:33 -------------------------------------------------------------------------------- Physician Orders Details Patient Name: Date of Service: [name], [name] 05/21/2020 7:30 A M Medical Record Number: [id number] Patient Account Number: [id number] Date of Birth/Sex: Treating RN: 10-18-1985 (35 y.o. 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 Bathing/ Shower/ Hygiene May shower and wash wound with soap and water. Edema Control - Lymphedema / SCD / Other Bilateral Lower Extremities Elevate legs to the level of the heart or above for 30 minutes daily and/or when sitting, a frequency of: - throughout the day Avoid standing for long periods of time. Exercise regularly Moisturize legs daily. - both legs every night before bed. Compression stocking or Garment 20-30 mm/Hg pressure to: - Apply to both legs in the morning and remove at night. Wound Treatment Wound #3 - Foot Wound Laterality: Left, Lateral Cleanser: Soap and Water 1 x Per Day/30 Days Discharge Instructions: May shower and wash wound with dial antibacterial soap and water prior to dressing change. Peri-Wound Care: Zinc Oxide Ointment 30g tube 1 x Per Day/30 Days Discharge Instructions: Apply Zinc Oxide to periwound with each dressing change as needed. Peri-Wound Care: Sween Lotion (Moisturizing lotion) 1 x Per Day/30 Days Discharge Instructions: Apply moisturizing lotion as directed Topical: keystone antibiotics 1 x Per Day/30 Days Discharge Instructions: apply topically daily. Prim Dressing: KerraCel Ag Gelling Fiber Dressing, 4x5 in (silver alginate) (Generic) 1 x Per Day/30 Days ary Discharge Instructions: Apply silver alginate over the keystone antibiotics to wound bed as instructed Secondary Dressing: ABD Pad, 8x10 1 x Per Day/30 Days Discharge Instructions: T down before applying kerlix. Apply over primary dressing as directed. ape Secured With: [employer], 4.5x3.1 (in/yd) (Generic) 1 x Per Day/30 Days Discharge Instructions: Secure with Kerlix as directed. Secured With: 22M Medipore H Soft Cloth Surgical T 4 x 2 (in/yd) (Generic) 1 x Per Day/30 Days ape Discharge Instructions: Secure dressing with tape as directed. Add-Ons: felt 1 x Per Day/30  Days Discharge Instructions: apply felt in shoe. Electronic Signature(s) Signed: 05/21/2020 5:29:55 PM By: [name] MD Signed: 05/21/2020 5:51:10 PM By: Deon Pilling Entered By: Deon Pilling on 05/21/2020 08:02:00 -------------------------------------------------------------------------------- Problem List Details Patient Name: Date of Service: [name], [name] 05/21/2020 7:30 A M Medical Record Number: [id number] Patient Account Number: [id number] Date of Birth/Sex: Treating RN: [birth year] (  35 y.o. 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, right ankle and foot 10/24/2019 10/24/2019 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, left ankle and foot 10/24/2019 10/24/2019 B95.62 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 10/24/2019 10/24/2019 classified elsewhere Resolved Problems Electronic Signature(s) Signed: 05/21/2020 5:29:55 PM By: [name] MD Entered By: [name] on 05/21/2020 08:07:50 -------------------------------------------------------------------------------- Progress Note Details Patient Name: Date of Service: [name], [name] 05/21/2020 7:30 A M Medical Record Number: [id number] Patient Account Number: [id number] Date of Birth/Sex: Treating RN: 1985-06-18 (35 y.o. 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 He comes in for review of a wound over the plantar fifth metatarsal head extending into the lateral part of the foot. He was followed for this previously by his podiatrist Dr. Cornelius Moras. As the patient tells his story he went to see podiatry first for a swelling he developed on the lateral part of his fifth metatarsal head in May. He states this was "open" by podiatry and the area closed. He was followed up in June and it was again opened callus removed and it closed promptly. There were plans being made for surgery on the fifth metatarsal head in June however his blood sugar was apparently too high for anesthesia. Apparently the area was debrided and opened again in June and it is never closed since. Looking over the records from podiatry I am really not able to follow this. It was clear when he was first seen it was before 5/14 at that point he already had a wound. By 5/17 the ulcer was resolved. I do not see anything about a procedure. On 5/28 noted to have pre-ulcerative moderate keratosis. X-ray noted 1/5 contracted toe and tailor's bunion and metatarsal deformity. On a visit date on 09/28/2018 the dorsal part of the left foot it healed and resolved. There was concern about swelling in his lower extremity he was sent to the ER.. As far as I can tell he was seen in the ER on 7/12 with an ulcer on his left foot. A DVT rule out of the left leg was negative. I do not think I have complete records from podiatry but I am not able to verify the procedures this patient states he had. He states after the last procedure the wound has never closed although I am not able to follow this in the records I have from podiatry. He has not had  a recent x-ray The patient has been using Neosporin on the wound. He is wearing a Darco shoe. He is still very active up on his foot working and exercising. Past medical history; type 2 diabetes ketosis-prone, leg swelling with a negative DVT study in July. Non-smoker ABI in our clinic was 0.85 on the left 10/16; substantial wound on the plantar left fifth  met head extending laterally almost to the dorsal fifth MTP. We have been using silver alginate we gave him a Darco forefoot off loader. An x-ray did not show evidence of osteomyelitis did note soft tissue emphysema which I think was due to gas tracking through an open wound. There is no doubt in my mind he requires an MRI 10/23; MRI not booked until 3 November at the earliest this is largely due to his glucose sensor in the right arm. We have been using silver alginate. There has been an improvement 10/29; I am still not exactly sure when his MRI is booked for. He says it is the third but it is the 10th in epic. This definitely needs to be done. He is running a low-grade fever today but no other symptoms. No real improvement in the 1 02/26/2019 patient presents today for a follow-up visit here in our clinic he is last been seen in the clinic on October 29. Subsequently we were working on getting MRI to evaluate and see what exactly was going on and where we would need to go from the standpoint of whether or not he had osteomyelitis and again what treatments were going be required. Subsequently the patient ended up being admitted to the hospital on 02/07/2019 and was discharged on 02/14/2019. This is a somewhat interesting admission with a discharge diagnosis of pneumonia due to COVID-19 although he was positive for COVID-19 when tested at the urgent care but negative x2 when he was actually in the hospital. With that being said he did have acute respiratory failure with hypoxia and it was noted he also have a left foot ulceration with  osteomyelitis. With that being said he did require oxygen for his pneumonia and I level 4 L. He was placed on antivirals and steroids for the COVID-19. He was also transferred to the [facility] at one point. Nonetheless he did subsequently discharged home and since being home has done much better in that regard. The CT angiogram did not show any pulmonary embolism. With regard to the osteomyelitis the patient was placed on vancomycin and Zosyn while in the hospital but has been changed to Augmentin at discharge. It was also recommended that he follow- up with wound care and podiatry. Podiatry however wanted him to see [name] according to the patient prior to them doing anything further. His hemoglobin A1c was 9.9 as noted in the hospital. Have an MRI of the left foot performed while in the hospital on 02/04/2019. 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 There is an overlying plantar open wound noted an abscess tracking back along the lateral aspect of the fifth metatarsal shaft. There is otherwise diffuse cellulitis and mild fasciitis without findings of polymyositis. The patient did have recently pneumonia secondary to COVID-19 I looked in the chart through epic and it does appear that the patient may need to have an additional x-ray just to ensure everything is cleared and that he has no airspace disease prior to putting him into the [name]. 03/05/2019; patient was readmitted to the clinic last week. He was hospitalized twice for a viral upper respiratory tract infection from 11/1 through 11/4 and then 11/5 through 11/12 ultimately this turned out to be Covid pneumonitis. Although he was discharged on oxygen he is not using it. He says he feels fine. He has no exercise limitation no cough no sputum. His O2 sat in our  clinic today was 100% on room air. He did manage to have his MRI which showed septic  arthritis at the fifth MTP joint and osteomyelitis involving the fifth metatarsal head and proximal phalanx. He received Vanco and Zosyn in the hospital and then was discharged on 2 weeks of Augmentin. I do not see any relevant cultures. He was supposed to follow-up with infectious disease but I do not see that he has an appointment. 12/8; patient saw Dr. Novella Olive of infectious disease last week. He felt that he had had adequate antibiotic therapy. He did not go to follow-up with Dr. Amalia Hailey of podiatry and I have again talked to him about the pros and cons of this. He does not want to consider a ray amputation of this time. He is aware of the risks of recurrence, migration etc. He started HBO today and tolerated this well. He can complete the Augmentin that I gave him last week. I have looked over the lab work that Dr. Chana Bode ordered his C-reactive protein was 3.3 and his sedimentation rate was 17. The C-reactive protein is never really been measurably that high in this patient 12/15; not much change in the wound today however he has undermining along the lateral part of the foot again more extensively than last week. He has some rims of epithelialization. We have been using silver alginate. He is undergoing hyperbarics but did not dive today 12/18; in for his obligatory first total contact cast change. Unfortunately there was pus coming from the undermining area around his fifth metatarsal head. This was cultured but will preclude reapplication of a cast. He is seen in conjunction with HBO 12/24; patient had staph lugdunensis in the wound in the undermining area laterally last time. We put him on doxycycline which should have covered this. The wound looks better today. I am going to give him another week of doxycycline before reattempting the total contact cast 12/31; the patient is completing antibiotics. Hemorrhagic debris in the distal part of the wound with some undermining distally. He also had  hyper granulation. Extensive debridement with a #5 curette. The infected area that was on the lateral part of the fifth met head is closed over. I do not think he needs any more antibiotics. Patient was seen prior to HBO. Preparations for a total contact cast were made in the cast will be placed post hyperbarics 04/11/19; once again the patient arrives today without complaint. He had been in a cast all week noted that he had heavy drainage this week. This resulted in large raised areas of macerated tissue around the wound 1/14; wound bed looks better slightly smaller. Hydrofera Blue has been changing himself. He had a heavy drainage last week which caused a lot of maceration around the wound so I took him out of a total contact cast he says the drainage is actually better this week He is seen today in conjunction with HBO 1/21; returns to clinic. He was up in [place] for a day or 2 attending a funeral. He comes back in with the wound larger and with a large area of exposed bone. He had osteomyelitis and septic arthritis of the fifth left metatarsal head while he was in hospital. He received IV antibiotics in the hospital for a prolonged period of time then 3 weeks of Augmentin. Subsequently I gave him 2 weeks of doxycycline for more superficial wound infection. When I saw this last week the wound was smaller the surface of the wound looks satisfactory. 1/28;  patient missed hyperbarics today. Bone biopsy I did last time showed Enterococcus faecalis and Staphylococcus lugdunensis . He has a wide area of exposed bone. We are going to use silver alginate as of today. I had another ethical discussion with the patient. 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 In this situation I think the likelihood of healing this is low. Therefore I have recommended a ray amputation and with the patient's agreement I have referred him to Dr. Doran Durand. The other issue is that his  compliance with hyperbarics has been minimal because of his work schedule and given his underlying decision I am going to stop this today READMISSION 10/24/2019 MRI 09/29/2019 left foot IMPRESSION: 1. Apparent skin ulceration inferior and lateral to the 5th metatarsal base with underlying heterogeneous T2 signal and enhancement in the subcutaneous fat. Small peripherally enhancing fluid collections along the plantar and lateral aspects of the 5th metatarsal base suspicious for abscesses. 2. Interval amputation through the mid 5th metatarsal with nonspecific low-level marrow edema and enhancement. Given the proximity to the adjacent soft tissue inflammatory changes, osteomyelitis cannot be excluded. 3. The additional bones appear unremarkable. MRI 09/29/2019 right foot IMPRESSION: 1. Soft tissue ulceration lateral to the 5th MTP joint. There is low-level T2 hyperintensity within the 4th and 5th metatarsal heads and adjacent proximal phalanges without abnormal T1 signal or cortical destruction. These findings are nonspecific and could be seen with early marrow edema, hyperemia or early osteomyelitis. No evidence of septic joint. 2. Mild tenosynovitis and synovial enhancement associated with the extensor digitorum tendons at the level of the midfoot. 3. Diffuse low-level muscular T2 hyperintensity and enhancement, most consistent with diabetic myopathy. LEFT FOOT BONE Methicillin resistant staphylococcus aureus Staphylococcus lugdunensis MIC MIC CIPROFLOXACIN >=8 RESISTANT Resistant <=0.5 SENSI... Sensitive CLINDAMYCIN <=0.25 SENS... Sensitive >=8 RESISTANT Resistant ERYTHROMYCIN >=8 RESISTANT Resistant >=8 RESISTANT Resistant GENTAMICIN <=0.5 SENSI... Sensitive <=0.5 SENSI... Sensitive Inducible Clindamycin NEGATIVE Sensitive NEGATIVE Sensitive OXACILLIN >=4 RESISTANT Resistant 2 SENSITIVE Sensitive RIFAMPIN <=0.5 SENSI... Sensitive <=0.5 SENSI... Sensitive TETRACYCLINE <=1 SENSITIVE  Sensitive <=1 SENSITIVE Sensitive TRIMETH/SULFA <=10 SENSIT Sensitive <=10 SENSIT Sensitive ... Marland Kitchen.. VANCOMYCIN 1 SENSITIVE Sensitive <=0.5 SENSI... Sensitive Right foot bone . Component 3 wk ago Specimen Description BONE Special Requests RIGHT 4 METATARSAL SAMPLE B Gram Stain NO WBC SEEN NO ORGANISMS SEEN Culture RARE METHICILLIN RESISTANT STAPHYLOCOCCUS AUREUS NO ANAEROBES ISOLATED Performed at [facility], [place] [address] [place], [place] [address] Report Status 10/08/2019 FINAL Organism ID, Bacteria METHICILLIN RESISTANT STAPHYLOCOCCUS AUREUS Resulting Agency [facility] Susceptibility Methicillin resistant staphylococcus aureus MIC CIPROFLOXACIN >=8 RESISTANT Resistant CLINDAMYCIN <=0.25 SENS... Sensitive ERYTHROMYCIN >=8 RESISTANT Resistant GENTAMICIN <=0.5 SENSI... Sensitive Inducible Clindamycin NEGATIVE Sensitive OXACILLIN >=4 RESISTANT Resistant RIFAMPIN <=0.5 SENSI... Sensitive TETRACYCLINE <=1 SENSITIVE Sensitive TRIMETH/SULFA <=10 SENSIT Sensitive ... VANCOMYCIN 1 SENSITIVE Sensitive This is a patient we had in clinic earlier this year with a wound over his left fifth metatarsal head. 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 Doran Durand on 05/16/2019. He seems to have developed problems with wounds on his bilateral feet in June 2021 although he may have had problems earlier than this. He was in an urgent care with a right foot ulcer on 09/26/2019 and given a course of doxycycline.  This was apparently after having trouble getting into see orthopedics. He was seen by podiatry on 09/28/2019 noted to have bilateral lower extremity ulcers including the left lateral fifth metatarsal base and the  right subfifth met head. It was noted that had purulent drainage at that time. He required hospitalization from 6/20 through 7/2. This was because of worsening right foot wounds. He underwent bilateral operative incision and drainage and bone biopsies bilaterally. Culture results are listed above. He has been referred back to clinic by Dr. Jacqualyn Posey of podiatry. He is also followed by Dr. Megan Salon who saw him yesterday. He was discharged from hospital on Zyvox Flagyl and Levaquin and yesterday changed to doxycycline Flagyl and Levaquin. His inflammatory markers on 6/26 showed a sedimentation rate of 129 and a C-reactive protein of 5. This is improved to 14 and 1.3 respectively. This would indicate improvement. ABIs in our clinic today were 1.23 on the right and 1.20 on the left 11/01/2019 on evaluation today patient appears to be doing fairly well in regard to the wounds on his feet at this point. Fortunately there is no signs of active infection at this time. No fevers, chills, nausea, vomiting, or diarrhea. He currently is seeing infectious disease and still under their care at this point. Subsequently he also has both wounds which she has not been using collagen on as he did not receive that in his packaging he did not call us and let us know that. Apparently that just was missed on the order. Nonetheless we will get that straightened out today. 8/9-Patient returns for bilateral foot wounds, using Prisma with hydrogel moistened dressings, and the wounds appear stable. Patient using surgical shoes, avoiding much pressure or weightbearing as much as possible 8/16; patient has bilateral foot wounds. 1 on the right lateral foot proximally the other is on the left mid lateral foot. Both required debridement of callus and thick skin around the wounds. We have been using silver collagen 8/27; patient has bilateral lateral foot wounds. The area on the left substantially surrounded by callus and dry skin. This  was removed from the wound edge. The underlying wound is small. The area on the right measured somewhat smaller today. 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 Unfortunately I did not update his antibiotics during today's visit. 9/10 I reviewed Dr. Hale Bogus last notes he felt he had completed antibiotics his inflammatory markers were reasonably well controlled. He has a small wound on the lateral left foot and a tiny area on the right which is just above closed. He is using Hydrofera Blue with border foam he has bilateral surgical shoes 9/24; 2 week f/u. doing well. right foot is closed. left foot still undermined. 10/14; right foot remains closed at the fifth met head. The area over the base of the left fifth metatarsal has a small open area but considerable undermining towards the plantar foot. Thick callus skin around this suggests an adequate pressure relief. We have talked about this. He says he is going to go back into his cam boot. I suggested a total contact cast he did not seem enamored with this suggestion 10/26; left foot base of the fifth metatarsal. Same condition as last time. He has skin over the area with an open wound however the skin is not adherent. He went to see Dr. Earleen Newport who did an x-ray and culture of his foot I have not reviewed the x-ray but the patient was  not told anything. He is on doxycycline 11/11; since the patient was last here he was in the emergency room on 10/30 he was concerned about swelling in the left foot. They did not do any cultures or x-rays. They changed his antibiotics to cephalexin. Previous culture showed group B strep. The cephalexin is appropriate as doxycycline has less than predictable coverage. Arrives in clinic today with swelling over this area under the wound. He also has a new wound on the right fifth metatarsal head 11/18; the patient has a difficult wound on the lateral  aspect of the left fifth metatarsal head. The wound was almost ballotable last week I opened it slightly expecting to see purulence however there was just bleeding. I cultured this this was negative. X-ray unchanged. We are trying to get an MRI but I am not sure were going to be able to get this through his insurance. He also has an area on the right lateral fifth metatarsal head this looks healthier 12/3; the patient finally got our MRI. Surprisingly this did not show osteomyelitis. I did show the soft tissue ulceration at the lateral plantar aspect of the fifth metatarsal base with a tiny residual 6 mm abscess overlying the superficial fascia I have tried to culture this area I have not been able to get this to grow anything. Nevertheless the protruding tissue looks aggravated. I suspect we should try to treat the underlying "abscess with broad-spectrum antibiotics. I am going to start him on Levaquin and Flagyl. He has much less edema in his legs and I am going to continue to wrap his legs and see him weekly 12/10. I started Levaquin and Flagyl on him last week. He just picked up the Flagyl apparently there was some delay. The worry is the wound on the left fifth metatarsal base which is substantial and worsening. His foot looks like he inverts at the ankle making this a weightbearing surface. Certainly no improvement in fact I think the measurements of this are somewhat worse. We have been using 12/17; he apparently just got the Levaquin yesterday this is 2 weeks after the fact. He has completed the Flagyl. The area over the left fifth metatarsal base still has protruding granulation tissue although it does not look quite as bad as it did some weeks ago. He has severe bilateral lymphedema although we have not been treating him for wounds on his legs this is definitely going to require compression. There was so much edema in the left I did not wish to put him in a total contact cast today. I am  going to increase his compression from 3-4 layer. The area on the right lateral fifth met head actually look quite good and superficial. 12/23; patient arrived with callus on the right fifth met head and the substantial hyper granulated callused wound on the base of his fifth metatarsal. He says he is completing his Levaquin in 2 days but I do not think that adds up with what I gave him but I will have to double check this. We are using Hydrofera Blue on both areas. My plan is to put the left leg in a cast the week after New Year's 04/06/2020; patient's wounds about the same. Right lateral fifth metatarsal head and left lateral foot over the base of the fifth metatarsal. There is undermining on the left lateral foot which I removed before application of total contact cast continuing with Hydrofera Blue new. Patient tells me he was seen by endocrinology today  lab work was done [Dr. Kerr]. Also wondering whether he was referred to cardiology. I went over some lab work from previously does not have chronic renal failure certainly not nephrotic range proteinuria he does have very poorly controlled diabetes but this is not his most updated lab work. Hemoglobin A1c has been over 11 1/10; the patient had a considerable amount of leakage towards mid part of his left foot with macerated skin however the wound surface looks better the area on the right lateral fifth met head is better as well. I am going to change the dressing on the left foot under the total contact cast to silver alginate, continue with Hydrofera Blue on the right. 1/20; patient was in the total contact cast for 10 days. Considerable amount of drainage although the skin around the wound does not look too bad on the left foot. The area on the right fifth metatarsal head is closed. Our nursing staff reports large amount of drainage out of the left lateral foot wound 1/25; continues with copious amounts of drainage described by our intake staff.  PCR culture I did last week showed E. coli and Enterococcus faecalis and low quantities. Multiple resistance genes documented including extended spectrum beta lactamase, MRSA, MRSE, quinolone, tetracycline. The wound is not quite as good this week as it was 5 days ago but about the same size 2/3; continues with copious amounts of malodorous drainage per our intake nurse. The PCR culture I did 2 weeks ago showed E. coli and low quantities of Enterococcus. There were multiple resistance genes detected. I put Neosporin on him last week although this does not seem to have helped. The wound is slightly deeper today. Offloading continues to be an issue here although with the amount of drainage she has a total contact cast is just not going to work 2/10; moderate amount of drainage. Patient reports he cannot get his stocking on over the dressing. I told him we have to do that the nurse gave him suggestions on how to make this work. The wound is on the bottom and lateral part of his left foot. Is cultured predominantly grew low amounts of Enterococcus, E. coli and anaerobes. There were multiple resistance genes detected including extended spectrum beta lactamase, quinolone, tetracycline. I could not think of an easy oral combination to address this so for now I am going to do topical antibiotics provided by [facility] I think the main agents here are vancomycin and an aminoglycoside. We have to be able to give him access to the wounds to get the topical antibiotic on 2/17; moderate amount of drainage this is unchanged. He has his Keystone topical antibiotic against the deep tissue culture organisms. He has been using this and changing the dressing daily. Silver alginate on the wound surface. Objective Constitutional Sitting or standing Blood Pressure is within target range for patient.. Pulse regular and within target range for patient.Marland Kitchen Respirations regular, non-labored and within target range.. Temperature  is normal and within the target range for the patient.Marland Kitchen Appears in no distress. Vitals Time Taken: 7:49 AM, Height: 77 in, Weight: 280 lbs, BMI: 33.2, Temperature: 98.5 F, Pulse: 93 bpm, Respiratory Rate: 18 breaths/min, Blood Pressure: 134/81 mmHg, Capillary Blood Glucose: 140 mg/dl. General Notes: glucose per pt report General Notes: Wound exam; left lateral fifth metatarsal base. Wound looks about the same. Mechanical debridement with a #5 curette removing subcutaneous tissue from the wound surface thick callus skin from the wound margin hemostasis with silver nitrate and a  pressure dressing Integumentary (Hair, Skin) Wound #3 status is Open. Original cause of wound was Trauma. The wound is located on the Left,Lateral Foot. The wound measures 2.7cm length x 2.5cm width x 0.9cm depth; 5.301cm^2 area and 4.771cm^3 volume. There is Fat Layer (Subcutaneous Tissue) exposed. There is no tunneling noted, however, there is undermining starting at 4:00 and ending at 12:00 with a maximum distance of 0.6cm. There is a medium amount of serosanguineous drainage noted. The wound margin is thickened. There is large (67-100%) red, pink, friable granulation within the wound bed. There is a small (1-33%) amount of necrotic tissue within the wound bed including Adherent Slough. 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 There was a Excisional Skin/Subcutaneous Tissue Debridement with a total area of 6.75 sq cm performed by [name], MD. With the following instrument(s): Curette to remove Viable and Non-Viable tissue/material. Material removed includes Callus, Subcutaneous Tissue,  Slough, Skin: Dermis, Skin: Epidermis, and Fibrin/Exudate after achieving pain control using Other (benzocaine 20%). A time out was conducted at 07:59, prior to the start of the procedure. A Moderate amount of bleeding was controlled with Pressure. The procedure was tolerated well with a pain level of 0 throughout and a pain level of 0 following the procedure. Post Debridement Measurements: 2.7cm length x 2.5cm width x 0.9cm depth; 4.771cm^3 volume. Character of Wound/Ulcer Post Debridement is improved. Severity of Tissue Post Debridement is: Fat layer exposed. Post procedure Diagnosis Wound #3: Same as Pre-Procedure Plan Follow-up Appointments: Return Appointment in 1 week. Bathing/ Shower/ Hygiene: May shower and wash wound with soap and water. Edema Control - Lymphedema / SCD / Other: Elevate legs to the level of the heart or above for 30 minutes daily and/or when sitting, a frequency of: - throughout the day Avoid standing for long periods of time. Exercise regularly Moisturize legs daily. - both legs every night before bed. Compression stocking or Garment 20-30 mm/Hg pressure to: - Apply to both legs in the morning and remove at night. WOUND #3: - Foot Wound Laterality: Left, Lateral Cleanser: Soap and Water 1 x Per Day/30 Days Discharge Instructions: May shower and wash wound with dial antibacterial soap and water prior to dressing change. Peri-Wound Care: Zinc Oxide Ointment 30g tube 1 x Per Day/30 Days Discharge Instructions: Apply Zinc Oxide to periwound with each dressing change as needed. Peri-Wound Care: Sween Lotion (Moisturizing lotion) 1 x Per Day/30 Days Discharge Instructions: Apply moisturizing lotion as directed Topical: keystone antibiotics 1 x Per Day/30 Days Discharge Instructions: apply topically daily. Prim Dressing: KerraCel Ag Gelling Fiber Dressing, 4x5 in (silver alginate) (Generic) 1 x Per Day/30 Days ary Discharge Instructions: Apply silver alginate over the  keystone antibiotics to wound bed as instructed Secondary Dressing: ABD Pad, 8x10 1 x Per Day/30 Days Discharge Instructions: T down before applying kerlix. Apply over primary dressing as directed. ape Secured With: [employer], 4.5x3.1 (in/yd) (Generic) 1 x Per Day/30 Days Discharge Instructions: Secure with Kerlix as directed. Secured With: 82M Medipore H Soft Cloth Surgical T 4 x 2 (in/yd) (Generic) 1 x Per Day/30 Days ape Discharge Instructions: Secure dressing with tape as directed. Add-Ons: felt 1 x Per Day/30 Days Discharge Instructions: apply felt in shoe. 1.  Stalled wound with excessive drainage on the plantar foot. This is a diabetic wound. 2. Topical antibiotics through the [facility] compounded pharmacy 3. Debridements are going to continue to be necessary to remove the biofilm there presumably is a hint [place] wound healing 4. There is no evidence of deep tissue infection no palpable bone. He is offloading this is best as he can. If the drainage would reduce I had like to put him back in a total contact cast that is what I am really hoping to see Electronic Signature(s) Signed: 05/21/2020 5:29:55 PM By: [name] MD Entered By: [name] on 05/21/2020 08:16:22 -------------------------------------------------------------------------------- SuperBill Details Patient Name: Date of Service: [name], [name] 05/21/2020 Medical Record Number: [id number] Patient Account Number: [id number] Date of Birth/Sex: Treating RN: [birth year] (35 y.o. [name] Primary Care Provider: [name] Other Clinician: Referring Provider: Treating Provider/Extender: [name] in Treatment: 30 Diagnosis Coding ICD-10 Codes Code Description [phone] Type 2 diabetes mellitus with foot ulcer L97.528 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ity Facility Procedures CPT4 Code: [id number] Description: 82800 -  DEB SUBQ TISSUE 20 SQ CM/< ICD-10 Diagnosis Description L97.528 Non-pressure chronic ulcer of other part of left foot with other specified seve Modifier: rity Quantity: 1 Physician Procedures : CPT4 Code Description Modifier [id number] - WC PHYS SUBQ TISS 20 SQ CM ICD-10 Diagnosis Description L97.528 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ity Quantity: 1 Electronic Signature(s) Signed: 05/21/2020 5:29:55 PM By: [name] MD Entered By: [name] on 05/21/2020 08:16:36

## 2020-05-22 NOTE — Progress Notes (Signed)
[name] ([id number]) Visit Report for 05/21/2020 Arrival Information Details Patient Name: Date of Service: [name] D 05/21/2020 7:30 A M Medical Record Number: [id number] Patient Account Number: [id number] Date of Birth/Sex: Treating RN: March 30, 1986 (35 y.o. [name] Primary Care Eva Griffo: [name] Other Clinician: Referring Miriam Kestler: Treating Mattalyn Anderegg/Extender: [name] in Treatment: 55 Visit Information History Since Last Visit Added or deleted any medications: No Patient Arrived: Ambulatory Any new allergies or adverse reactions: No Arrival Time: 07:49 Had a fall or experienced change in No Accompanied By: alone activities of daily living that may affect Transfer Assistance: None risk of falls: Patient Identification Verified: Yes Signs or symptoms of abuse/neglect since last visito No Secondary Verification Process Completed: Yes Hospitalized since last visit: No Patient Requires Transmission-Based Precautions: No Implantable device outside of the clinic excluding No Patient Has Alerts: No cellular tissue based products placed in the center since last visit: Has Dressing in Place as Prescribed: Yes Pain Present Now: No Electronic Signature(s) Signed: 05/22/2020 4:25:56 PM By: [name] RN, BSN Entered By: [name] on 05/21/2020 07:49:55 -------------------------------------------------------------------------------- Encounter Discharge Information Details Patient Name: Date of Service: [name], [name] 05/21/2020 7:30 A M Medical Record Number: [id number] Patient Account Number: [id number] Date of Birth/Sex: Treating RN: Aug 31, 1985 (35 y.o. [name] Primary Care Keola Heninger: [name] Other Clinician: Referring Ariana Juul: Treating Atina Feeley/Extender: [name] in Treatment: 30 Encounter Discharge Information Items Post Procedure Vitals Discharge Condition:  Stable Temperature (F): 98.5 Ambulatory Status: Ambulatory Pulse (bpm): 93 Discharge Destination: Home Respiratory Rate (breaths/min): 18 Transportation: Private Auto Blood Pressure (mmHg): 134/81 Accompanied By: self Schedule Follow-up Appointment: Yes Clinical Summary of Care: Patient Declined Electronic Signature(s) Signed: 05/21/2020 6:31:13 PM By: Baruch Gouty RN, BSN Entered By: Baruch Gouty on 05/21/2020 08:19:11 -------------------------------------------------------------------------------- Lower Extremity Assessment Details Patient Name: Date of Service: [name], [name] 05/21/2020 7:30 A M Medical Record Number: [id number] Patient Account Number: [id number] Date of Birth/Sex: Treating RN: [birth year] (35 y.o. [name] Primary Care Geraldene Eisel: [name] Other Clinician: Referring Lizbet Cirrincione: Treating Merdis Snodgrass/Extender: [name] in Treatment: 30 Edema Assessment Assessed: [name]: No] [name]: No] Edema: [Left: Ye] [Right: s] Calf Left: Right: Point of Measurement: 48 cm From Medial Instep 43.5 cm Ankle Left: Right: Point of Measurement: 10 cm From Medial Instep 29 cm Vascular Assessment Pulses: Dorsalis Pedis Palpable: [Left:Yes] Electronic Signature(s) Signed: 05/22/2020 4:25:56 PM By: [name] RN, BSN Entered By: [name] on 05/21/2020 07:55:21 -------------------------------------------------------------------------------- Multi Wound Chart Details Patient Name: Date of Service: [name], [name] 05/21/2020 7:30 A M Medical Record Number: [id number] Patient Account Number: [id number] Date of Birth/Sex: Treating RN: [birth year] (35 y.o. [name] Primary Care Khushi Zupko: [name] Other Clinician: Referring Brenn Gatton: Treating Giovanni Biby/Extender: [name] in Treatment: 30 Vital Signs Height(in): 77 Capillary Blood Glucose(mg/dl): 140 Weight(lbs):  280 Pulse(bpm): 93 Body Mass Index(BMI): 33 Blood Pressure(mmHg): 134/81 Temperature(F): 98.5 Respiratory Rate(breaths/min): 18 Photos: [3:No Photos Left, Lateral Foot] [N/A:N/A N/A] Wound Location: [3:Trauma] [N/A:N/A] Wounding Event: [3:Diabetic Wound/Ulcer of the Lower] [N/A:N/A] Primary Etiology: [3:Extremity Type II Diabetes] [N/A:N/A] Comorbid History: [3:10/02/2019] [N/A:N/A] Date Acquired: [3:30] [N/A:N/A] Weeks of Treatment: [3:Open] [N/A:N/A] Wound Status: [3:2.7x2.5x0.9] [N/A:N/A] Measurements L x W x D (cm) [3:5.301] [N/A:N/A] A (cm) : rea [3:4.771] [N/A:N/A] Volume (cm) : [3:-221.50%] [N/A:N/A] % Reduction in A rea: [3:-2791.50%] [N/A:N/A] % Reduction in Volume: [3:4] Starting Position 1 (o'clock): [3:12] Ending Position 1 (o'clock): [  3:0.6] Maximum Distance 1 (cm): [3:Yes] [N/A:N/A] Undermining: [3:Grade 2] [N/A:N/A] Classification: [3:Medium] [N/A:N/A] Exudate A mount: [3:Serosanguineous] [N/A:N/A] Exudate Type: [3:red, brown] [N/A:N/A] Exudate Color: [3:Thickened] [N/A:N/A] Wound Margin: [3:Large (67-100%)] [N/A:N/A] Granulation A mount: [3:Red, Pink, Friable] [N/A:N/A] Granulation Quality: [3:Small (1-33%)] [N/A:N/A] Necrotic A mount: [3:Fat Layer (Subcutaneous Tissue): Yes N/A] Exposed Structures: [3:Fascia: No Tendon: No Muscle: No Joint: No Bone: No Small (1-33%)] [N/A:N/A] Epithelialization: [3:Debridement - Excisional] [N/A:N/A] Debridement: Pre-procedure Verification/Time Out 07:59 [N/A:N/A] Taken: [3:Other] [N/A:N/A] Pain Control: [3:Callus, Subcutaneous, Slough] [N/A:N/A] Tissue Debrided: [3:Skin/Subcutaneous Tissue] [N/A:N/A] Level: [3:6.75] [N/A:N/A] Debridement A (sq cm): [3:rea Curette] [N/A:N/A] Instrument: [3:Moderate] [N/A:N/A] Bleeding: [3:Pressure] [N/A:N/A] Hemostasis A chieved: [3:0] [N/A:N/A] Procedural Pain: [3:0] [N/A:N/A] Post Procedural Pain: [3:Procedure was tolerated well] [N/A:N/A] Debridement Treatment Response:  [3:2.7x2.5x0.9] [N/A:N/A] Post Debridement Measurements L x W x D (cm) [3:4.771] [N/A:N/A] Post Debridement Volume: (cm) [3:Debridement] [N/A:N/A] Treatment Notes Electronic Signature(s) Signed: 05/21/2020 5:29:55 PM By: [name] MD Signed: 05/21/2020 5:51:10 PM By: Deon Pilling Entered By: [name] on 05/21/2020 08:07:58 -------------------------------------------------------------------------------- Multi-Disciplinary Care Plan Details Patient Name: Date of Service: [name], [name] 05/21/2020 7:30 A M Medical Record Number: [id number] Patient Account Number: [id number] Date of Birth/Sex: Treating RN: 1985/05/08 (35 y.o. [name] Primary Care Arihant Pennings: [name] Other Clinician: Referring Zafar Debrosse: Treating Breccan Galant/Extender: [name] in Treatment: 30 Active Inactive Nutrition Nursing Diagnoses: Imbalanced nutrition Potential for alteratiion in Nutrition/Potential for imbalanced nutrition Goals: Patient/caregiver agrees to and verbalizes understanding of need to use nutritional supplements and/or vitamins as prescribed Date Initiated: 10/24/2019 Date Inactivated: 04/06/2020 Target Resolution Date: 04/03/2020 Goal Status: Met Patient/caregiver will maintain therapeutic glucose control Date Initiated: 10/24/2019 Target Resolution Date: 06/05/2020 Goal Status: Active Interventions: Assess HgA1c results as ordered upon admission and as needed Assess patient nutrition upon admission and as needed per policy Provide education on elevated blood sugars and impact on wound healing Provide education on nutrition Treatment Activities: Education provided on Nutrition : 04/23/2020 Notes: Wound/Skin Impairment Nursing Diagnoses: Impaired tissue integrity Knowledge deficit related to ulceration/compromised skin integrity Goals: Patient/caregiver will verbalize understanding of skin care regimen Date Initiated: 10/24/2019 Target  Resolution Date: 06/06/2020 Goal Status: Active Ulcer/skin breakdown will have a volume reduction of 30% by week 4 Date Initiated: 10/24/2019 Date Inactivated: 01/16/2020 Target Resolution Date: 01/10/2020 Unmet Reason: no change in Goal Status: Unmet measurements. Interventions: Assess patient/caregiver ability to obtain necessary supplies Assess patient/caregiver ability to perform ulcer/skin care regimen upon admission and as needed Assess ulceration(s) every visit Provide education on ulcer and skin care Notes: Electronic Signature(s) Signed: 05/21/2020 5:51:10 PM By: Deon Pilling Entered By: Deon Pilling on 05/21/2020 07:55:35 -------------------------------------------------------------------------------- Pain Assessment Details Patient Name: Date of Service: [name] D 05/21/2020 7:30 A M Medical Record Number: [id number] Patient Account Number: [id number] Date of Birth/Sex: Treating RN: 1986/01/03 (35 y.o. [name] Primary Care Laurette Villescas: [name] Other Clinician: Referring Marivel Mcclarty: Treating Dorman Calderwood/Extender: [name] in Treatment: 30 Active Problems Location of Pain Severity and Description of Pain Patient Has Paino No Site Locations Pain Management and Medication Current Pain Management: Electronic Signature(s) Signed: 05/22/2020 4:25:56 PM By: [name] RN, BSN Entered By: [name] on 05/21/2020 07:50:20 -------------------------------------------------------------------------------- Patient/Caregiver Education Details Patient Name: Date of Service: [name], [name] 2/17/2022andnbsp7:30 A M Medical Record Number: [id number] Patient Account Number: [id number] Date of Birth/Gender: Treating RN: 1985-05-30 (35 y.o. [name] Primary Care Physician: [name] Other Clinician: Referring Physician: Treating Physician/Extender: Fuller Canada,  Lowell Bouton in Treatment:  30 Education Assessment Education Provided To: Patient Education Topics Provided Elevated Blood Sugar/ Impact on Healing: Handouts: Elevated Blood Sugars: How Do They Affect Wound Healing Methods: Explain/Verbal Responses: Reinforcements needed Electronic Signature(s) Signed: 05/21/2020 5:51:10 PM By: Deon Pilling Entered By: Deon Pilling on 05/21/2020 07:56:16 -------------------------------------------------------------------------------- Wound Assessment Details Patient Name: Date of Service: [name] D 05/21/2020 7:30 A M Medical Record Number: [id number] Patient Account Number: [id number] Date of Birth/Sex: Treating RN: [birth year] (35 y.o. [name] Primary Care Keyanna Sandefer: [name] Other Clinician: Referring Brilyn Tuller: Treating Merlene Dante/Extender: [name] in Treatment: 30 Wound Status Wound Number: 3 Primary Etiology: Diabetic Wound/Ulcer of the Lower Extremity Wound Location: Left, Lateral Foot Wound Status: Open Wounding Event: Trauma Comorbid History: Type II Diabetes Date Acquired: 10/02/2019 Weeks Of Treatment: 30 Clustered Wound: No Wound Measurements Length: (cm) 2.7 Width: (cm) 2.5 Depth: (cm) 0.9 Area: (cm) 5.301 Volume: (cm) 4.771 % Reduction in Area: -221.5% % Reduction in Volume: -2791.5% Epithelialization: Small (1-33%) Tunneling: No Undermining: Yes Starting Position (o'clock): 4 Ending Position (o'clock): 12 Maximum Distance: (cm) 0.6 Wound Description Classification: Grade 2 Wound Margin: Thickened Exudate Amount: Medium Exudate Type: Serosanguineous Exudate Color: red, brown Foul Odor After Cleansing: No Slough/Fibrino Yes Wound Bed Granulation Amount: Large (67-100%) Exposed Structure Granulation Quality: Red, Pink, Friable Fascia Exposed: No Necrotic Amount: Small (1-33%) Fat Layer (Subcutaneous Tissue) Exposed: Yes Necrotic Quality: Adherent Slough Tendon Exposed: No Muscle  Exposed: No Joint Exposed: No Bone Exposed: No Treatment Notes Wound #3 (Foot) Wound Laterality: Left, Lateral Cleanser Soap and Water Discharge Instruction: May shower and wash wound with dial antibacterial soap and water prior to dressing change. Peri-Wound Care Zinc Oxide Ointment 30g tube Discharge Instruction: Apply Zinc Oxide to periwound with each dressing change as needed. Sween Lotion (Moisturizing lotion) Discharge Instruction: Apply moisturizing lotion as directed Topical keystone antibiotics Discharge Instruction: apply topically daily. Primary Dressing KerraCel Ag Gelling Fiber Dressing, 4x5 in (silver alginate) Discharge Instruction: Apply silver alginate over the keystone antibiotics to wound bed as instructed Secondary Dressing ABD Pad, 8x10 Discharge Instruction: T down before applying kerlix. Apply over primary dressing as directed. ape Secured With [employer], 4.5x3.1 (in/yd) Discharge Instruction: Secure with Kerlix as directed. 69M Medipore H Soft Cloth Surgical T 4 x 2 (in/yd) ape Discharge Instruction: Secure dressing with tape as directed. Compression Wrap Compression Stockings Add-Ons felt Discharge Instruction: apply felt in shoe. Electronic Signature(s) Signed: 05/22/2020 4:25:56 PM By: [name] RN, BSN Entered By: [name] on 05/21/2020 07:56:07 -------------------------------------------------------------------------------- [place] Details Patient Name: Date of Service: [name], [name] 05/21/2020 7:30 A M Medical Record Number: [id number] Patient Account Number: [id number] Date of Birth/Sex: Treating RN: 09-17-1985 (35 y.o. [name] Primary Care Iain Sawchuk: [name] Other Clinician: Referring Jerris Keltz: Treating Daaiyah Baumert/Extender: [name] in Treatment: 30 Vital Signs Time Taken: 07:49 Temperature (F): 98.5 Height (in): 77 Pulse (bpm): 93 Weight (lbs): 280 Respiratory Rate  (breaths/min): 18 Body Mass Index (BMI): 33.2 Blood Pressure (mmHg): 134/81 Capillary Blood Glucose (mg/dl): 140 Reference Range: 80 - 120 mg / dl Notes glucose per pt report Electronic Signature(s) Signed: 05/22/2020 4:25:56 PM By: [name] RN, BSN Entered By: [name] on 05/21/2020 07:50:15

## 2020-05-28 ENCOUNTER — Encounter (HOSPITAL_BASED_OUTPATIENT_CLINIC_OR_DEPARTMENT_OTHER): Payer: BC Managed Care – PPO | Admitting: Internal Medicine

## 2020-05-28 ENCOUNTER — Other Ambulatory Visit: Payer: Self-pay

## 2020-05-28 DIAGNOSIS — E11621 Type 2 diabetes mellitus with foot ulcer: Secondary | ICD-10-CM | POA: Diagnosis not present

## 2020-05-28 NOTE — Progress Notes (Signed)
[place], [name] ([id number]) Visit Report for 05/28/2020 Arrival Information Details Patient Name: Date of Service: [name] 05/28/2020 8:15 A M Medical Record Number: [id number] Patient Account Number: [id number] Date of Birth/Sex: Treating RN: 02/03/1986 (35 y.o. [name], Meta.Reding Primary Care Shekina Cordell: [name] Other Clinician: Referring Cody Oliger: Treating Aldine Chakraborty/Extender: [name] in Treatment: 62 Visit Information History Since Last Visit Added or deleted any medications: No Patient Arrived: Ambulatory Any new allergies or adverse reactions: No Arrival Time: 08:03 Had a fall or experienced change in No Accompanied By: self activities of daily living that may affect Transfer Assistance: None risk of falls: Patient Identification Verified: Yes Signs or symptoms of abuse/neglect since last visito No Secondary Verification Process Completed: Yes Hospitalized since last visit: No Patient Requires Transmission-Based Precautions: No Implantable device outside of the clinic excluding No Patient Has Alerts: No cellular tissue based products placed in the center since last visit: Has Dressing in Place as Prescribed: Yes Pain Present Now: No Electronic Signature(s) Signed: 05/28/2020 10:36:55 AM By: Sandre Kitty Entered By: Sandre Kitty on 05/28/2020 08:04:13 -------------------------------------------------------------------------------- Encounter Discharge Information Details Patient Name: Date of Service: [name], [name] 05/28/2020 8:15 A M Medical Record Number: [id number] Patient Account Number: [id number] Date of Birth/Sex: Treating RN: [birth year] (35 y.o. [name] Primary Care Gladies Sofranko: [name] Other Clinician: Referring Giorgio Chabot: Treating Zaccheus Edmister/Extender: [name] in Treatment: 31 Encounter Discharge Information Items Post Procedure Vitals Discharge Condition:  Stable Temperature (F): 97.9 Ambulatory Status: Ambulatory Pulse (bpm): 97 Discharge Destination: Home Respiratory Rate (breaths/min): 18 Transportation: Private Auto Blood Pressure (mmHg): 127/82 Accompanied By: self Schedule Follow-up Appointment: Yes Clinical Summary of Care: Patient Declined Electronic Signature(s) Signed: 05/28/2020 5:40:23 PM By: Baruch Gouty RN, BSN Entered By: Baruch Gouty on 05/28/2020 09:25:42 -------------------------------------------------------------------------------- Lower Extremity Assessment Details Patient Name: Date of Service: [name], [name] 05/28/2020 8:15 A M Medical Record Number: [id number] Patient Account Number: [id number] Date of Birth/Sex: Treating RN: [birth year] (35 y.o. [name] Primary Care Mattthew Ziomek: [name] Other Clinician: Referring Legrande Hao: Treating Saturnino Liew/Extender: [name] in Treatment: 31 Edema Assessment Assessed: [name]: Yes] [name]: No] Edema: [Left: Ye] [Right: s] Calf Left: Right: Point of Measurement: 48 cm From Medial Instep 39 cm Ankle Left: Right: Point of Measurement: 10 cm From Medial Instep 25 cm Knee To Floor Left: Right: From Medial Instep 54 cm Vascular Assessment Pulses: Dorsalis Pedis Palpable: [Left:Yes] Electronic Signature(s) Signed: 05/28/2020 5:39:27 PM By: Deon Pilling Entered By: Deon Pilling on 05/28/2020 08:23:14 -------------------------------------------------------------------------------- Multi Wound Chart Details Patient Name: Date of Service: [name], [name] 05/28/2020 8:15 A M Medical Record Number: [id number] Patient Account Number: [id number] Date of Birth/Sex: Treating RN: 05/01/1985 (35 y.o. [name] Primary Care Marque Bango: [name] Other Clinician: Referring Fianna Snowball: Treating Petrea Fredenburg/Extender: [name] in Treatment: 31 Vital Signs Height(in): 77 Capillary Blood  Glucose(mg/dl): 101 Weight(lbs): 280 Pulse(bpm): 97 Body Mass Index(BMI): 33 Blood Pressure(mmHg): 127/82 Temperature(F): 97.9 Respiratory Rate(breaths/min): 18 Photos: [3:No Photos Left, Lateral Foot] [N/A:N/A N/A] Wound Location: [3:Trauma] [N/A:N/A] Wounding Event: [3:Diabetic Wound/Ulcer of the Lower] [N/A:N/A] Primary Etiology: [3:Extremity Type II Diabetes] [N/A:N/A] Comorbid History: [3:10/02/2019] [N/A:N/A] Date Acquired: [3:31] [N/A:N/A] Weeks of Treatment: [3:Open] [N/A:N/A] Wound Status: [3:2.6x2.5x0.9] [N/A:N/A] Measurements L x W x D (cm) [3:5.105] [N/A:N/A] A (cm) : rea [3:4.595] [N/A:N/A] Volume (cm) : [3:-209.60%] [N/A:N/A] % Reduction in A rea: [3:-2684.80%] [N/A:N/A] % Reduction in Volume: [3:Grade 2] [N/A:N/A] Classification: [  3:Medium] [N/A:N/A] Exudate A mount: [3:Serosanguineous] [N/A:N/A] Exudate Type: [3:red, brown] [N/A:N/A] Exudate Color: [3:Thickened] [N/A:N/A] Wound Margin: [3:Large (67-100%)] [N/A:N/A] Granulation A mount: [3:Red, Pink] [N/A:N/A] Granulation Quality: [3:Small (1-33%)] [N/A:N/A] Necrotic A mount: [3:Fat Layer (Subcutaneous Tissue): Yes N/A] Exposed Structures: [3:Fascia: No Tendon: No Muscle: No Joint: No Bone: No Small (1-33%)] [N/A:N/A] Epithelialization: [3:Debridement - Excisional] [N/A:N/A] Debridement: Pre-procedure Verification/Time Out 08:50 [N/A:N/A] Taken: [3:Lidocaine 4% T opical Solution] [N/A:N/A] Pain Control: [3:Subcutaneous] [N/A:N/A] Tissue Debrided: [3:Skin/Subcutaneous Tissue] [N/A:N/A] Level: [3:6.5] [N/A:N/A] Debridement A (sq cm): [3:rea Curette] [N/A:N/A] Instrument: [3:Moderate] [N/A:N/A] Bleeding: [3:Silver Nitrate] [N/A:N/A] Hemostasis A chieved: [3:0] [N/A:N/A] Procedural Pain: [3:0] [N/A:N/A] Post Procedural Pain: [3:Procedure was tolerated well] [N/A:N/A] Debridement Treatment Response: [3:2.6x2.5x0.9] [N/A:N/A] Post Debridement Measurements L x W x D (cm) [3:4.595] [N/A:N/A] Post Debridement  Volume: (cm) [3:Debridement] [N/A:N/A] Treatment Notes Electronic Signature(s) Signed: 05/28/2020 5:09:37 PM By: [name] MD Signed: 05/28/2020 5:39:27 PM By: Deon Pilling Entered By: [name] on 05/28/2020 09:06:10 -------------------------------------------------------------------------------- Multi-Disciplinary Care Plan Details Patient Name: Date of Service: [name], [name] 05/28/2020 8:15 A M Medical Record Number: [id number] Patient Account Number: [id number] Date of Birth/Sex: Treating RN: [birth year] (35 y.o. [name] Primary Care Kimberlye Dilger: [name] Other Clinician: Referring Patriciann Becht: Treating Shermika Balthaser/Extender: [name] in Treatment: 1 Multidisciplinary Care Plan reviewed with physician Active Inactive Nutrition Nursing Diagnoses: Imbalanced nutrition Potential for alteratiion in Nutrition/Potential for imbalanced nutrition Goals: Patient/caregiver agrees to and verbalizes understanding of need to use nutritional supplements and/or vitamins as prescribed Date Initiated: 10/24/2019 Date Inactivated: 04/06/2020 Target Resolution Date: 04/03/2020 Goal Status: Met Patient/caregiver will maintain therapeutic glucose control Date Initiated: 10/24/2019 Target Resolution Date: 06/05/2020 Goal Status: Active Interventions: Assess HgA1c results as ordered upon admission and as needed Assess patient nutrition upon admission and as needed per policy Provide education on elevated blood sugars and impact on wound healing Provide education on nutrition Treatment Activities: Education provided on Nutrition : 04/28/2020 Notes: Wound/Skin Impairment Nursing Diagnoses: Impaired tissue integrity Knowledge deficit related to ulceration/compromised skin integrity Goals: Patient/caregiver will verbalize understanding of skin care regimen Date Initiated: 10/24/2019 Target Resolution Date: 06/06/2020 Goal Status: Active Ulcer/skin  breakdown will have a volume reduction of 30% by week 4 Date Initiated: 10/24/2019 Date Inactivated: 01/16/2020 Target Resolution Date: 01/10/2020 Unmet Reason: no change in Goal Status: Unmet measurements. Interventions: Assess patient/caregiver ability to obtain necessary supplies Assess patient/caregiver ability to perform ulcer/skin care regimen upon admission and as needed Assess ulceration(s) every visit Provide education on ulcer and skin care Notes: Electronic Signature(s) Signed: 05/28/2020 5:39:27 PM By: Deon Pilling Entered By: Deon Pilling on 05/28/2020 08:09:34 -------------------------------------------------------------------------------- Pain Assessment Details Patient Name: Date of Service: [name] 05/28/2020 8:15 A M Medical Record Number: [id number] Patient Account Number: [id number] Date of Birth/Sex: Treating RN: 21-Nov-1985 (35 y.o. [name] Primary Care Maryalice Pasley: [name] Other Clinician: Referring Javed Cotto: Treating Wyn Nettle/Extender: [name] in Treatment: 31 Active Problems Location of Pain Severity and Description of Pain Patient Has Paino No Site Locations Pain Management and Medication Current Pain Management: Electronic Signature(s) Signed: 05/28/2020 10:36:55 AM By: Sandre Kitty Signed: 05/28/2020 5:39:27 PM By: Deon Pilling Entered By: Sandre Kitty on 05/28/2020 08:07:30 -------------------------------------------------------------------------------- Patient/Caregiver Education Details Patient Name: Date of Service: [name], [name] 2/24/2022andnbsp8:15 A M Medical Record Number: [id number] Patient Account Number: [id number] Date of Birth/Gender: Treating RN: Jan 08, 1986 (35 y.o. [name] Primary Care Physician: [name] Other Clinician: Referring Physician: Treating Physician/Extender: [name]  [name], Lowell Bouton in Treatment: 31 Education  Assessment Education Provided To: Patient Education Topics Provided Elevated Blood Sugar/ Impact on Healing: Handouts: Elevated Blood Sugars: How Do They Affect Wound Healing Methods: Explain/Verbal Responses: Reinforcements needed Electronic Signature(s) Signed: 05/28/2020 5:39:27 PM By: Deon Pilling Entered By: Deon Pilling on 05/28/2020 08:09:56 -------------------------------------------------------------------------------- Wound Assessment Details Patient Name: Date of Service: [name] 05/28/2020 8:15 A M Medical Record Number: [id number] Patient Account Number: [id number] Date of Birth/Sex: Treating RN: May 15, 1985 (35 y.o. [name] Primary Care Leonte Horrigan: [name] Other Clinician: Referring Murriel Holwerda: Treating Yanin Muhlestein/Extender: [name] in Treatment: 31 Wound Status Wound Number: 3 Primary Etiology: Diabetic Wound/Ulcer of the Lower Extremity Wound Location: Left, Lateral Foot Wound Status: Open Wounding Event: Trauma Comorbid History: Type II Diabetes Date Acquired: 10/02/2019 Weeks Of Treatment: 31 Clustered Wound: No Wound Measurements Length: (cm) 2.6 Width: (cm) 2.5 Depth: (cm) 0.9 Area: (cm) 5.105 Volume: (cm) 4.595 % Reduction in Area: -209.6% % Reduction in Volume: -2684.8% Epithelialization: Small (1-33%) Tunneling: No Undermining: No Wound Description Classification: Grade 2 Wound Margin: Thickened Exudate Amount: Medium Exudate Type: Serosanguineous Exudate Color: red, brown Foul Odor After Cleansing: No Slough/Fibrino Yes Wound Bed Granulation Amount: Large (67-100%) Exposed Structure Granulation Quality: Red, Pink Fascia Exposed: No Necrotic Amount: Small (1-33%) Fat Layer (Subcutaneous Tissue) Exposed: Yes Necrotic Quality: Adherent Slough Tendon Exposed: No Muscle Exposed: No Joint Exposed: No Bone Exposed: No Treatment Notes Wound #3 (Foot) Wound Laterality: Left,  Lateral Cleanser Normal Saline Discharge Instruction: Cleanse the wound with Normal Saline prior to applying a clean dressing using gauze sponges, not tissue or cotton balls. Soap and Water Discharge Instruction: May shower and wash wound with dial antibacterial soap and water prior to dressing change. Peri-Wound Care Zinc Oxide Ointment 30g tube Discharge Instruction: Apply Zinc Oxide to periwound with each dressing change as needed. Sween Lotion (Moisturizing lotion) Discharge Instruction: Apply moisturizing lotion as directed Topical keystone antibiotics Discharge Instruction: apply topically daily. Primary Dressing KerraCel Ag Gelling Fiber Dressing, 4x5 in (silver alginate) Discharge Instruction: Apply silver alginate over the keystone antibiotics to wound bed as instructed Secondary Dressing ABD Pad, 8x10 Discharge Instruction: T down before applying kerlix. Apply over primary dressing as directed. ape Secured With [employer], 4.5x3.1 (in/yd) Discharge Instruction: Secure with Kerlix as directed. 27M Medipore H Soft Cloth Surgical T 4 x 2 (in/yd) ape Discharge Instruction: Secure dressing with tape as directed. Compression Wrap Compression Stockings Add-Ons felt Discharge Instruction: apply felt in shoe. Electronic Signature(s) Signed: 05/28/2020 5:39:27 PM By: Deon Pilling Entered By: Deon Pilling on 05/28/2020 08:23:49 -------------------------------------------------------------------------------- Vitals Details Patient Name: Date of Service: [name], [name] 05/28/2020 8:15 A M Medical Record Number: [id number] Patient Account Number: [id number] Date of Birth/Sex: Treating RN: 1985-09-26 (35 y.o. [name] Primary Care Lazette Estala: [name] Other Clinician: Referring Graceann Boileau: Treating Revella Shelton/Extender: [name] in Treatment: 31 Vital Signs Time Taken: 08:04 Temperature (F): 97.9 Height (in): 77 Pulse  (bpm): 97 Weight (lbs): 280 Respiratory Rate (breaths/min): 18 Body Mass Index (BMI): 33.2 Blood Pressure (mmHg): 127/82 Capillary Blood Glucose (mg/dl): 101 Reference Range: 80 - 120 mg / dl Electronic Signature(s) Signed: 05/28/2020 10:36:55 AM By: Sandre Kitty Entered By: Sandre Kitty on 05/28/2020 08:07:26

## 2020-05-28 NOTE — Progress Notes (Signed)
[name], Max ([id number]) Visit Report for 05/28/2020 Debridement Details Patient Name: Date of Service: [name] 05/28/2020 8:15 A M Medical Record Number: [id number] Patient Account Number: [id number] Date of Birth/Sex: Treating RN: Dec 28, 1985 (35 y.o. [name] Primary Care Provider: [name] Other Clinician: Referring Provider: Treating Provider/Extender: [name] in Treatment: 31 Debridement Performed for Assessment: Wound #3 Left,Lateral Foot Performed By: Physician [name], MD Debridement Type: Debridement Severity of Tissue Pre Debridement: Fat layer exposed Level of Consciousness (Pre-procedure): Awake and Alert Pre-procedure Verification/Time Out Yes - 08:50 Taken: Start Time: 08:51 Pain Control: Lidocaine 4% T opical Solution T Area Debrided (L x W): otal 2.6 (cm) x 2.5 (cm) = 6.5 (cm) Tissue and other material debrided: Viable, Non-Viable, Subcutaneous, Skin: Dermis , Skin: Epidermis, Fibrin/Exudate Level: Skin/Subcutaneous Tissue Debridement Description: Excisional Instrument: Curette Bleeding: Moderate Hemostasis Achieved: Silver Nitrate End Time: 08:56 Procedural Pain: 0 Post Procedural Pain: 0 Response to Treatment: Procedure was tolerated well Level of Consciousness (Post- Awake and Alert procedure): Post Debridement Measurements of Total Wound Length: (cm) 2.6 Width: (cm) 2.5 Depth: (cm) 0.9 Volume: (cm) 4.595 Character of Wound/Ulcer Post Debridement: Improved Severity of Tissue Post Debridement: Fat layer exposed Post Procedure Diagnosis Same as Pre-procedure Electronic Signature(s) Signed: 05/28/2020 5:09:37 PM By: [name] MD Signed: 05/28/2020 5:39:27 PM By: Deon Pilling Entered By: [name] on 05/28/2020 09:06:19 -------------------------------------------------------------------------------- HPI Details Patient Name: Date of Service: [name], [name] 05/28/2020  8:15 A M Medical Record Number: [id number] Patient Account Number: [id number] Date of Birth/Sex: Treating RN: 19-Sep-1985 (35 y.o. 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 He comes in for review of a wound over the plantar fifth metatarsal head extending into the lateral part of the foot. He was followed for this previously by his podiatrist Dr. Cornelius Moras. As the patient tells his story he went to see podiatry first for a swelling he developed on the lateral part of his fifth metatarsal head in May. He states this was "open" by podiatry and the area closed. He was followed up in June and it was again opened callus removed and it closed promptly. There were plans being made for surgery on the fifth metatarsal head in June however his blood sugar was apparently too high for anesthesia. Apparently the area was debrided and opened again in June and it is never closed since. Looking over the records from podiatry I am really not able to follow this. It was clear when he was first seen it was before 5/14 at that point he already had a wound. By 5/17 the ulcer was resolved. I do not see anything about a procedure. On 5/28 noted to have pre-ulcerative moderate keratosis. X-ray noted 1/5 contracted toe and tailor's bunion and metatarsal deformity. On a visit date on 09/28/2018 the dorsal part of the left foot it healed and resolved. There was concern about swelling in his lower extremity he was sent to the ER.. As far as I can tell he was seen in the ER on 7/12 with an ulcer on his left foot. A DVT rule out of the left leg was negative. I do not think I have complete records from podiatry but I am not able to verify the procedures this  patient states he had. He states after the last procedure the wound has never closed although I am not able to follow this in the records I have from podiatry. He has not had a recent x-ray The patient has been using Neosporin on the wound. He is wearing a Darco shoe. He is still very active up on his foot working and exercising. Past medical history; type 2 diabetes ketosis-prone, leg swelling with a negative DVT study in July. Non-smoker ABI in our clinic was 0.85 on the left 10/16; substantial wound on the plantar left fifth met head extending laterally almost to the dorsal fifth MTP. We have been using silver alginate we gave him a Darco forefoot off loader. An x-ray did not show evidence of osteomyelitis did note soft tissue emphysema which I think was due to gas tracking through an open wound. There is no doubt in my mind he requires an MRI 10/23; MRI not booked until 3 November at the earliest this is largely due to his glucose sensor in the right arm. We have been using silver alginate. There has been an improvement 10/29; I am still not exactly sure when his MRI is booked for. He says it is the third but it is the 10th in epic. This definitely needs to be done. He is running a low-grade fever today but no other symptoms. No real improvement in the 1 02/26/2019 patient presents today for a follow-up visit here in our clinic he is last been seen in the clinic on October 29. Subsequently we were working on getting MRI to evaluate and see what exactly was going on and where we would need to go from the standpoint of whether or not he had osteomyelitis and again what treatments were going be required. Subsequently the patient ended up being admitted to the hospital on 02/07/2019 and was discharged on 02/14/2019. This is a somewhat interesting admission with a discharge diagnosis of pneumonia due to COVID-19 although he was positive for COVID-19 when tested at the urgent care but negative x2 when  he was actually in the hospital. With that being said he did have acute respiratory failure with hypoxia and it was noted he also have a left foot ulceration with osteomyelitis. With that being said he did require oxygen for his pneumonia and I level 4 L. He was placed on antivirals and steroids for the COVID-19. He was also transferred to the [facility] at one point. Nonetheless he did subsequently discharged home and since being home has done much better in that regard. The CT angiogram did not show any pulmonary embolism. With regard to the osteomyelitis the patient was placed on vancomycin and Zosyn while in the hospital but has been changed to Augmentin at discharge. It was also recommended that he follow- up with wound care and podiatry. Podiatry however wanted him to see [name] according to the patient prior to them doing anything further. His hemoglobin A1c was 9.9 as noted in the hospital. Have an MRI of the left foot performed while in the hospital on 02/04/2019. 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 There is an overlying plantar open wound noted an abscess tracking back along the lateral aspect of the fifth metatarsal shaft. There is otherwise diffuse cellulitis and mild fasciitis without findings of polymyositis. The patient did have recently pneumonia secondary to COVID-19 I looked in the chart through epic and it does  appear that the patient may need to have an additional x-ray just to ensure everything is cleared and that he has no airspace disease prior to putting him into the [name]. 03/05/2019; patient was readmitted to the clinic last week. He was hospitalized twice for a viral upper respiratory tract infection from 11/1 through 11/4 and then 11/5 through 11/12 ultimately this turned out to be Covid pneumonitis. Although he was discharged on oxygen he is not using it. He says he  feels fine. He has no exercise limitation no cough no sputum. His O2 sat in our clinic today was 100% on room air. 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 He received Vanco and Zosyn in the hospital and then was discharged on 2 weeks of Augmentin. I do not see any relevant cultures. He was supposed to follow-up with infectious disease but I do not see that he has an appointment. 12/8; patient saw Dr. Novella Olive of infectious disease last week. He felt that he had had adequate antibiotic therapy. He did not go to follow-up with Dr. Amalia Hailey of podiatry and I have again talked to him about the pros and cons of this. He does not want to consider a ray amputation of this time. He is aware of the risks of recurrence, migration etc. He started HBO today and tolerated this well. He can complete the Augmentin that I gave him last week. I have looked over the lab work that Dr. Chana Bode ordered his C-reactive protein was 3.3 and his sedimentation rate was 17. The C-reactive protein is never really been measurably that high in this patient 12/15; not much change in the wound today however he has undermining along the lateral part of the foot again more extensively than last week. He has some rims of epithelialization. We have been using silver alginate. He is undergoing hyperbarics but did not dive today 12/18; in for his obligatory first total contact cast change. Unfortunately there was pus coming from the undermining area around his fifth metatarsal head. This was cultured but will preclude reapplication of a cast. He is seen in conjunction with HBO 12/24; patient had staph lugdunensis in the wound in the undermining area laterally last time. We put him on doxycycline which should have covered this. The wound looks better today. I am going to give him another week of doxycycline before reattempting  the total contact cast 12/31; the patient is completing antibiotics. Hemorrhagic debris in the distal part of the wound with some undermining distally. He also had hyper granulation. Extensive debridement with a #5 curette. The infected area that was on the lateral part of the fifth met head is closed over. I do not think he needs any more antibiotics. Patient was seen prior to HBO. Preparations for a total contact cast were made in the cast will be placed post hyperbarics 04/11/19; once again the patient arrives today without complaint. He had been in a cast all week noted that he had heavy drainage this week. This resulted in large raised areas of macerated tissue around the wound 1/14; wound bed looks better slightly smaller. Hydrofera Blue has been changing himself. He had a heavy drainage last week which caused a lot of maceration around the wound so I took him out of a total contact cast he says the drainage is actually better this week He is seen today in conjunction with HBO 1/21;  returns to clinic. He was up in [place] for a day or 2 attending a funeral. He comes back in with the wound larger and with a large area of exposed bone. He had osteomyelitis and septic arthritis of the fifth left metatarsal head while he was in hospital. He received IV antibiotics in the hospital for a prolonged period of time then 3 weeks of Augmentin. Subsequently I gave him 2 weeks of doxycycline for more superficial wound infection. When I saw this last week the wound was smaller the surface of the wound looks satisfactory. 1/28; patient missed hyperbarics today. Bone biopsy I did last time showed Enterococcus faecalis and Staphylococcus lugdunensis . He has a wide area of exposed bone. We are going to use silver alginate as of today. I had another ethical discussion with the patient. 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 In this situation I think the likelihood of healing  this is low. Therefore I have recommended a ray amputation and with the patient's agreement I have referred him to Dr. Doran Durand. The other issue is that his compliance with hyperbarics has been minimal because of his work schedule and given his underlying decision I am going to stop this today READMISSION 10/24/2019 MRI 09/29/2019 left foot IMPRESSION: 1. Apparent skin ulceration inferior and lateral to the 5th metatarsal base with underlying heterogeneous T2 signal and enhancement in the subcutaneous fat. Small peripherally enhancing fluid collections along the plantar and lateral aspects of the 5th metatarsal base suspicious for abscesses. 2. Interval amputation through the mid 5th metatarsal with nonspecific low-level marrow edema and enhancement. Given the proximity to the adjacent soft tissue inflammatory changes, osteomyelitis cannot be excluded. 3. The additional bones appear unremarkable. MRI 09/29/2019 right foot IMPRESSION: 1. Soft tissue ulceration lateral to the 5th MTP joint. There is low-level T2 hyperintensity within the 4th and 5th metatarsal heads and adjacent proximal phalanges without abnormal T1 signal or cortical destruction. These findings are nonspecific and could be seen with early marrow edema, hyperemia or early osteomyelitis. No evidence of septic joint. 2. Mild tenosynovitis and synovial enhancement associated with the extensor digitorum tendons at the level of the midfoot. 3. Diffuse low-level muscular T2 hyperintensity and enhancement, most consistent with diabetic myopathy. LEFT FOOT BONE Methicillin resistant staphylococcus aureus Staphylococcus lugdunensis MIC MIC CIPROFLOXACIN >=8 RESISTANT Resistant <=0.5 SENSI... Sensitive CLINDAMYCIN <=0.25 SENS... Sensitive >=8 RESISTANT Resistant ERYTHROMYCIN >=8 RESISTANT Resistant >=8 RESISTANT Resistant GENTAMICIN <=0.5 SENSI... Sensitive <=0.5 SENSI... Sensitive Inducible Clindamycin NEGATIVE Sensitive  NEGATIVE Sensitive OXACILLIN >=4 RESISTANT Resistant 2 SENSITIVE Sensitive RIFAMPIN <=0.5 SENSI... Sensitive <=0.5 SENSI... Sensitive TETRACYCLINE <=1 SENSITIVE Sensitive <=1 SENSITIVE Sensitive TRIMETH/SULFA <=10 SENSIT Sensitive <=10 SENSIT Sensitive ... Marland Kitchen.. VANCOMYCIN 1 SENSITIVE Sensitive <=0.5 SENSI... Sensitive Right foot bone . Component 3 wk ago Specimen Description BONE Special Requests RIGHT 4 METATARSAL SAMPLE B Gram Stain NO WBC SEEN NO ORGANISMS SEEN Culture RARE METHICILLIN RESISTANT STAPHYLOCOCCUS AUREUS NO ANAEROBES ISOLATED Performed at [facility], [place] [address], [place], [place] [address] Report Status 10/08/2019 FINAL Organism ID, Bacteria METHICILLIN RESISTANT STAPHYLOCOCCUS AUREUS Resulting Agency [facility] Susceptibility Methicillin resistant staphylococcus aureus MIC CIPROFLOXACIN >=8 RESISTANT Resistant CLINDAMYCIN <=0.25 SENS... Sensitive ERYTHROMYCIN >=8 RESISTANT Resistant GENTAMICIN <=0.5 SENSI... Sensitive Inducible Clindamycin NEGATIVE Sensitive OXACILLIN >=4 RESISTANT Resistant RIFAMPIN <=0.5 SENSI... Sensitive TETRACYCLINE <=1 SENSITIVE Sensitive TRIMETH/SULFA <=10 SENSIT Sensitive ... VANCOMYCIN 1 SENSITIVE Sensitive This is a patient we had in clinic earlier this year with a wound over his left fifth metatarsal  head. 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 Doran Durand on 05/16/2019. He seems to have developed problems with wounds on his bilateral feet in June 2021 although he may have had problems earlier than this. He was in an urgent care with a right foot ulcer on 09/26/2019 and given a course of doxycycline. This was apparently after having trouble getting  into see orthopedics. He was seen by podiatry on 09/28/2019 noted to have bilateral lower extremity ulcers including the left lateral fifth metatarsal base and the right subfifth met head. It was noted that had purulent drainage at that time. He required hospitalization from 6/20 through 7/2. This was because of worsening right foot wounds. He underwent bilateral operative incision and drainage and bone biopsies bilaterally. Culture results are listed above. He has been referred back to clinic by Dr. Jacqualyn Posey of podiatry. He is also followed by Dr. Megan Salon who saw him yesterday. He was discharged from hospital on Zyvox Flagyl and Levaquin and yesterday changed to doxycycline Flagyl and Levaquin. His inflammatory markers on 6/26 showed a sedimentation rate of 129 and a C-reactive protein of 5. This is improved to 14 and 1.3 respectively. This would indicate improvement. ABIs in our clinic today were 1.23 on the right and 1.20 on the left 11/01/2019 on evaluation today patient appears to be doing fairly well in regard to the wounds on his feet at this point. Fortunately there is no signs of active infection at this time. No fevers, chills, nausea, vomiting, or diarrhea. He currently is seeing infectious disease and still under their care at this point. Subsequently he also has both wounds which she has not been using collagen on as he did not receive that in his packaging he did not call us and let us know that. Apparently that just was missed on the order. Nonetheless we will get that straightened out today. 8/9-Patient returns for bilateral foot wounds, using Prisma with hydrogel moistened dressings, and the wounds appear stable. Patient using surgical shoes, avoiding much pressure or weightbearing as much as possible 8/16; patient has bilateral foot wounds. 1 on the right lateral foot proximally the other is on the left mid lateral foot. Both required debridement of callus and thick skin around the  wounds. We have been using silver collagen 8/27; patient has bilateral lateral foot wounds. The area on the left substantially surrounded by callus and dry skin. This was removed from the wound edge. The underlying wound is small. The area on the right measured somewhat smaller today. 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 Unfortunately I did not update his antibiotics during today's visit. 9/10 I reviewed Dr. Hale Bogus last notes he felt he had completed antibiotics his inflammatory markers were reasonably well controlled. He has a small wound on the lateral left foot and a tiny area on the right which is just above closed. He is using Hydrofera Blue with border foam he has bilateral surgical shoes 9/24; 2 week f/u. doing well. right foot is closed. left foot still undermined. 10/14; right foot remains closed at the fifth met head. The area over the base of the left fifth metatarsal has a small open area but considerable undermining towards  the plantar foot. Thick callus skin around this suggests an adequate pressure relief. We have talked about this. He says he is going to go back into his cam boot. I suggested a total contact cast he did not seem enamored with this suggestion 10/26; left foot base of the fifth metatarsal. Same condition as last time. He has skin over the area with an open wound however the skin is not adherent. He went to see Dr. Earleen Newport who did an x-ray and culture of his foot I have not reviewed the x-ray but the patient was not told anything. He is on doxycycline 11/11; since the patient was last here he was in the emergency room on 10/30 he was concerned about swelling in the left foot. They did not do any cultures or x-rays. They changed his antibiotics to cephalexin. Previous culture showed group B strep. The cephalexin is appropriate as doxycycline has less than predictable coverage. Arrives in clinic  today with swelling over this area under the wound. He also has a new wound on the right fifth metatarsal head 11/18; the patient has a difficult wound on the lateral aspect of the left fifth metatarsal head. The wound was almost ballotable last week I opened it slightly expecting to see purulence however there was just bleeding. I cultured this this was negative. X-ray unchanged. We are trying to get an MRI but I am not sure were going to be able to get this through his insurance. He also has an area on the right lateral fifth metatarsal head this looks healthier 12/3; the patient finally got our MRI. Surprisingly this did not show osteomyelitis. I did show the soft tissue ulceration at the lateral plantar aspect of the fifth metatarsal base with a tiny residual 6 mm abscess overlying the superficial fascia I have tried to culture this area I have not been able to get this to grow anything. Nevertheless the protruding tissue looks aggravated. I suspect we should try to treat the underlying "abscess with broad-spectrum antibiotics. I am going to start him on Levaquin and Flagyl. He has much less edema in his legs and I am going to continue to wrap his legs and see him weekly 12/10. I started Levaquin and Flagyl on him last week. He just picked up the Flagyl apparently there was some delay. The worry is the wound on the left fifth metatarsal base which is substantial and worsening. His foot looks like he inverts at the ankle making this a weightbearing surface. Certainly no improvement in fact I think the measurements of this are somewhat worse. We have been using 12/17; he apparently just got the Levaquin yesterday this is 2 weeks after the fact. He has completed the Flagyl. The area over the left fifth metatarsal base still has protruding granulation tissue although it does not look quite as bad as it did some weeks ago. He has severe bilateral lymphedema although we have not been treating him for  wounds on his legs this is definitely going to require compression. There was so much edema in the left I did not wish to put him in a total contact cast today. I am going to increase his compression from 3-4 layer. The area on the right lateral fifth met head actually look quite good and superficial. 12/23; patient arrived with callus on the right fifth met head and the substantial hyper granulated callused wound on the base of his fifth metatarsal. He says he is completing his Levaquin in 2 days  but I do not think that adds up with what I gave him but I will have to double check this. We are using Hydrofera Blue on both areas. My plan is to put the left leg in a cast the week after New Year's 04/06/2020; patient's wounds about the same. Right lateral fifth metatarsal head and left lateral foot over the base of the fifth metatarsal. There is undermining on the left lateral foot which I removed before application of total contact cast continuing with Hydrofera Blue new. Patient tells me he was seen by endocrinology today lab work was done [Dr. Kerr]. Also wondering whether he was referred to cardiology. I went over some lab work from previously does not have chronic renal failure certainly not nephrotic range proteinuria he does have very poorly controlled diabetes but this is not his most updated lab work. Hemoglobin A1c has been over 11 1/10; the patient had a considerable amount of leakage towards mid part of his left foot with macerated skin however the wound surface looks better the area on the right lateral fifth met head is better as well. I am going to change the dressing on the left foot under the total contact cast to silver alginate, continue with Hydrofera Blue on the right. 1/20; patient was in the total contact cast for 10 days. Considerable amount of drainage although the skin around the wound does not look too bad on the left foot. The area on the right fifth metatarsal head is closed.  Our nursing staff reports large amount of drainage out of the left lateral foot wound 1/25; continues with copious amounts of drainage described by our intake staff. PCR culture I did last week showed E. coli and Enterococcus faecalis and low quantities. Multiple resistance genes documented including extended spectrum beta lactamase, MRSA, MRSE, quinolone, tetracycline. The wound is not quite as good this week as it was 5 days ago but about the same size 2/3; continues with copious amounts of malodorous drainage per our intake nurse. The PCR culture I did 2 weeks ago showed E. coli and low quantities of Enterococcus. There were multiple resistance genes detected. I put Neosporin on him last week although this does not seem to have helped. The wound is slightly deeper today. Offloading continues to be an issue here although with the amount of drainage she has a total contact cast is just not going to work 2/10; moderate amount of drainage. Patient reports he cannot get his stocking on over the dressing. I told him we have to do that the nurse gave him suggestions on how to make this work. The wound is on the bottom and lateral part of his left foot. Is cultured predominantly grew low amounts of Enterococcus, E. coli and anaerobes. There were multiple resistance genes detected including extended spectrum beta lactamase, quinolone, tetracycline. I could not think of an easy oral combination to address this so for now I am going to do topical antibiotics provided by [facility] I think the main agents here are vancomycin and an aminoglycoside. We have to be able to give him access to the wounds to get the topical antibiotic on 2/17; moderate amount of drainage this is unchanged. He has his Keystone topical antibiotic against the deep tissue culture organisms. He has been using this and changing the dressing daily. Silver alginate on the wound surface. 2/24; using Keystone antibiotic with silver alginate on  the top. He had too much drainage for a total contact cast at one point  although I think that is improving and I think in the next week or 2 it might be possible to replace a total contact cast I did not do this today. In general the wound surface looks healthy however he continues to have thick rims of skin and subcutaneous tissue around the wide area of the circumference which I debrided Electronic Signature(s) Signed: 05/28/2020 5:09:37 PM By: [name] MD Entered By: [name] on 05/28/2020 09:08:17 -------------------------------------------------------------------------------- Physical Exam Details Patient Name: Date of Service: [name], [name] 05/28/2020 8:15 A M Medical Record Number: [id number] Patient Account Number: [id number] Date of Birth/Sex: Treating RN: Oct 17, 1985 (35 y.o. [name] Primary Care Provider: [name] Other Clinician: Referring Provider: Treating Provider/Extender: [name] in Treatment: 31 Constitutional Sitting or standing Blood Pressure is within target range for patient.. Pulse regular and within target range for patient.Marland Kitchen Respirations regular, non-labored and within target range.. Temperature is normal and within the target range for the patient.Marland Kitchen Appears in no distress. Cardiovascular Pedal pulses are palpable. Notes Wound exam; left lateral fifth metatarsal base. The wound looks somewhat dry but again raised thick edges of skin and subcutaneous tissue around the margins I remove this with a #5 curette hemostasis with silver nitrate and a pressure dressing. Looking at his dressing from last night shows really not that much drainage. No evidence of infection around the wound margin Electronic Signature(s) Signed: 05/28/2020 5:09:37 PM By: [name] MD Entered By: [name] on 05/28/2020 09:09:36 -------------------------------------------------------------------------------- Physician  Orders Details Patient Name: Date of Service: [name], [name] 05/28/2020 8:15 A M Medical Record Number: [id number] Patient Account Number: [id number] Date of Birth/Sex: Treating RN: Mar 03, 1986 (35 y.o. 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 Bathing/ Shower/ Hygiene May shower and wash wound with soap and water. Edema Control - Lymphedema / SCD / Other Bilateral Lower Extremities Elevate legs to the level of the heart or above for 30 minutes daily and/or when sitting, a frequency of: - throughout the day Avoid standing for long periods of time. Exercise regularly Moisturize legs daily. - both legs every night before bed. Compression stocking or Garment 20-30 mm/Hg pressure to: - Apply to both legs in the morning and remove at night. Wound Treatment Wound #3 - Foot Wound Laterality: Left, Lateral Cleanser: Normal Saline (DME) (Generic) 1 x Per Day/30 Days Discharge Instructions: Cleanse the wound with Normal Saline prior to applying a clean dressing using gauze sponges, not tissue or cotton balls. Cleanser: Soap and Water 1 x Per Day/30 Days Discharge Instructions: May shower and wash wound with dial antibacterial soap and water prior to dressing change. Peri-Wound Care: Zinc Oxide Ointment 30g tube 1 x Per Day/30 Days Discharge Instructions: Apply Zinc Oxide to periwound with each dressing change as needed. Peri-Wound Care: Sween Lotion (Moisturizing lotion) 1 x Per Day/30 Days Discharge Instructions: Apply moisturizing lotion as directed Topical: keystone antibiotics 1 x Per Day/30  Days Discharge Instructions: apply topically daily. Prim Dressing: KerraCel Ag Gelling Fiber Dressing, 4x5 in (silver alginate) 1 x Per Day/30 Days ary Discharge Instructions: Apply silver alginate over the keystone antibiotics to wound bed as instructed Secondary Dressing:  ABD Pad, 8x10 1 x Per Day/30 Days Discharge Instructions: T down before applying kerlix. Apply over primary dressing as directed. ape Secured With: [employer], 4.5x3.1 (in/yd) (Generic) 1 x Per Day/30 Days Discharge Instructions: Secure with Kerlix as directed. Secured With: 40M Medipore H Soft Cloth Surgical T 4 x 2 (in/yd) (Generic) 1 x Per Day/30 Days ape Discharge Instructions: Secure dressing with tape as directed. Add-Ons: felt 1 x Per Day/30 Days Discharge Instructions: apply felt in shoe. Electronic Signature(s) Signed: 05/28/2020 5:09:37 PM By: [name] MD Signed: 05/28/2020 5:39:27 PM By: Deon Pilling Entered By: Deon Pilling on 05/28/2020 08:59:09 -------------------------------------------------------------------------------- Problem List Details Patient Name: Date of Service: [name], [name] 05/28/2020 8:15 A M Medical Record Number: [id number] Patient Account Number: [id number] Date of Birth/Sex: Treating RN: [birth year] (35 y.o. 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, right ankle  and foot 10/24/2019 10/24/2019 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, left ankle and foot 10/24/2019 10/24/2019 B95.62 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 10/24/2019 10/24/2019 classified elsewhere Resolved Problems Electronic Signature(s) Signed: 05/28/2020 5:09:37 PM By: [name] MD Entered By: [name] on 05/28/2020 09:06:03 -------------------------------------------------------------------------------- Progress Note Details Patient Name: Date of Service: [name], [name] 05/28/2020 8:15 A M Medical Record Number: [id number] Patient Account Number: [id number] Date of Birth/Sex: Treating RN: 1985-09-12 (35 y.o. 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 He comes in for review of a wound over the plantar fifth metatarsal head extending into the lateral part of the foot. He was followed for this previously by his podiatrist Dr. Cornelius Moras. As the patient tells his story he went to see podiatry first for a swelling he developed on the lateral part of his fifth metatarsal head in May. He states this was "open" by podiatry and the area closed. He was followed up in June and it was again opened callus removed and it closed promptly. There were plans being made for surgery on the fifth metatarsal head in June however his blood sugar was apparently too high for anesthesia. Apparently the area was debrided and opened again in June and it is never closed since. Looking over the records from podiatry I am really not able to follow this. It was clear when he was first seen it was before 5/14 at that point he already had a wound. By 5/17 the ulcer was resolved. I do  not see anything about a procedure. On 5/28 noted to have pre-ulcerative moderate keratosis. X-ray noted 1/5 contracted toe and tailor's bunion and metatarsal deformity. On a visit date on 09/28/2018 the dorsal part of the left foot it healed and resolved. There was concern about swelling in his lower extremity he was sent to the ER.. As far as I can tell he was seen in  the ER on 7/12 with an ulcer on his left foot. A DVT rule out of the left leg was negative. I do not think I have complete records from podiatry but I am not able to verify the procedures this patient states he had. He states after the last procedure the wound has never closed although I am not able to follow this in the records I have from podiatry. He has not had a recent x-ray The patient has been using Neosporin on the wound. He is wearing a Darco shoe. He is still very active up on his foot working and exercising. Past medical history; type 2 diabetes ketosis-prone, leg swelling with a negative DVT study in July. Non-smoker ABI in our clinic was 0.85 on the left 10/16; substantial wound on the plantar left fifth met head extending laterally almost to the dorsal fifth MTP. We have been using silver alginate we gave him a Darco forefoot off loader. An x-ray did not show evidence of osteomyelitis did note soft tissue emphysema which I think was due to gas tracking through an open wound. There is no doubt in my mind he requires an MRI 10/23; MRI not booked until 3 November at the earliest this is largely due to his glucose sensor in the right arm. We have been using silver alginate. There has been an improvement 10/29; I am still not exactly sure when his MRI is booked for. He says it is the third but it is the 10th in epic. This definitely needs to be done. He is running a low-grade fever today but no other symptoms. No real improvement in the 1 02/26/2019 patient presents today for a follow-up visit here in our clinic he is last  been seen in the clinic on October 29. Subsequently we were working on getting MRI to evaluate and see what exactly was going on and where we would need to go from the standpoint of whether or not he had osteomyelitis and again what treatments were going be required. Subsequently the patient ended up being admitted to the hospital on 02/07/2019 and was discharged on 02/14/2019. This is a somewhat interesting admission with a discharge diagnosis of pneumonia due to COVID-19 although he was positive for COVID-19 when tested at the urgent care but negative x2 when he was actually in the hospital. With that being said he did have acute respiratory failure with hypoxia and it was noted he also have a left foot ulceration with osteomyelitis. With that being said he did require oxygen for his pneumonia and I level 4 L. He was placed on antivirals and steroids for the COVID-19. He was also transferred to the [facility] at one point. Nonetheless he did subsequently discharged home and since being home has done much better in that regard. The CT angiogram did not show any pulmonary embolism. With regard to the osteomyelitis the patient was placed on vancomycin and Zosyn while in the hospital but has been changed to Augmentin at discharge. It was also recommended that he follow- up with wound care and podiatry. Podiatry however wanted him to see [name] according to the patient prior to them doing anything further. His hemoglobin A1c was 9.9 as noted in the hospital. Have an MRI of the left foot performed while in the hospital on 02/04/2019. 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 There is an overlying plantar open wound noted an abscess tracking  back along the lateral aspect of the fifth metatarsal shaft. There is otherwise diffuse cellulitis and mild fasciitis without findings of polymyositis. The patient  did have recently pneumonia secondary to COVID-19 I looked in the chart through epic and it does appear that the patient may need to have an additional x-ray just to ensure everything is cleared and that he has no airspace disease prior to putting him into the [name]. 03/05/2019; patient was readmitted to the clinic last week. He was hospitalized twice for a viral upper respiratory tract infection from 11/1 through 11/4 and then 11/5 through 11/12 ultimately this turned out to be Covid pneumonitis. Although he was discharged on oxygen he is not using it. He says he feels fine. He has no exercise limitation no cough no sputum. His O2 sat in our clinic today was 100% on room air. 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 He received Vanco and Zosyn in the hospital and then was discharged on 2 weeks of Augmentin. I do not see any relevant cultures. He was supposed to follow-up with infectious disease but I do not see that he has an appointment. 12/8; patient saw Dr. Novella Olive of infectious disease last week. He felt that he had had adequate antibiotic therapy. He did not go to follow-up with Dr. Amalia Hailey of podiatry and I have again talked to him about the pros and cons of this. He does not want to consider a ray amputation of this time. He is aware of the risks of recurrence, migration etc. He started HBO today and tolerated this well. He can complete the Augmentin that I gave him last week. I have looked over the lab work that Dr. Chana Bode ordered his C-reactive protein was 3.3 and his sedimentation rate was 17. The C-reactive protein is never really been measurably that high in this patient 12/15; not much change in the wound today however he has undermining along the lateral part of the foot again more extensively than last week. He has some rims of epithelialization. We have been using  silver alginate. He is undergoing hyperbarics but did not dive today 12/18; in for his obligatory first total contact cast change. Unfortunately there was pus coming from the undermining area around his fifth metatarsal head. This was cultured but will preclude reapplication of a cast. He is seen in conjunction with HBO 12/24; patient had staph lugdunensis in the wound in the undermining area laterally last time. We put him on doxycycline which should have covered this. The wound looks better today. I am going to give him another week of doxycycline before reattempting the total contact cast 12/31; the patient is completing antibiotics. Hemorrhagic debris in the distal part of the wound with some undermining distally. He also had hyper granulation. Extensive debridement with a #5 curette. The infected area that was on the lateral part of the fifth met head is closed over. I do not think he needs any more antibiotics. Patient was seen prior to HBO. Preparations for a total contact cast were made in the cast will be placed post hyperbarics 04/11/19; once again the patient arrives today without complaint. He had been in a cast all week noted that he had heavy drainage this week. This resulted in large raised areas of macerated tissue around the wound 1/14; wound bed looks better slightly smaller. Hydrofera Blue has been changing himself. He had a  heavy drainage last week which caused a lot of maceration around the wound so I took him out of a total contact cast he says the drainage is actually better this week He is seen today in conjunction with HBO 1/21; returns to clinic. He was up in [place] for a day or 2 attending a funeral. He comes back in with the wound larger and with a large area of exposed bone. He had osteomyelitis and septic arthritis of the fifth left metatarsal head while he was in hospital. He received IV antibiotics in the hospital for a prolonged period of time then 3 weeks of  Augmentin. Subsequently I gave him 2 weeks of doxycycline for more superficial wound infection. When I saw this last week the wound was smaller the surface of the wound looks satisfactory. 1/28; patient missed hyperbarics today. Bone biopsy I did last time showed Enterococcus faecalis and Staphylococcus lugdunensis . He has a wide area of exposed bone. We are going to use silver alginate as of today. I had another ethical discussion with the patient. 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 In this situation I think the likelihood of healing this is low. Therefore I have recommended a ray amputation and with the patient's agreement I have referred him to Dr. Doran Durand. The other issue is that his compliance with hyperbarics has been minimal because of his work schedule and given his underlying decision I am going to stop this today READMISSION 10/24/2019 MRI 09/29/2019 left foot IMPRESSION: 1. Apparent skin ulceration inferior and lateral to the 5th metatarsal base with underlying heterogeneous T2 signal and enhancement in the subcutaneous fat. Small peripherally enhancing fluid collections along the plantar and lateral aspects of the 5th metatarsal base suspicious for abscesses. 2. Interval amputation through the mid 5th metatarsal with nonspecific low-level marrow edema and enhancement. Given the proximity to the adjacent soft tissue inflammatory changes, osteomyelitis cannot be excluded. 3. The additional bones appear unremarkable. MRI 09/29/2019 right foot IMPRESSION: 1. Soft tissue ulceration lateral to the 5th MTP joint. There is low-level T2 hyperintensity within the 4th and 5th metatarsal heads and adjacent proximal phalanges without abnormal T1 signal or cortical destruction. These findings are nonspecific and could be seen with early marrow edema, hyperemia or early osteomyelitis. No evidence of septic joint. 2. Mild tenosynovitis and synovial  enhancement associated with the extensor digitorum tendons at the level of the midfoot. 3. Diffuse low-level muscular T2 hyperintensity and enhancement, most consistent with diabetic myopathy. LEFT FOOT BONE Methicillin resistant staphylococcus aureus Staphylococcus lugdunensis MIC MIC CIPROFLOXACIN >=8 RESISTANT Resistant <=0.5 SENSI... Sensitive CLINDAMYCIN <=0.25 SENS... Sensitive >=8 RESISTANT Resistant ERYTHROMYCIN >=8 RESISTANT Resistant >=8 RESISTANT Resistant GENTAMICIN <=0.5 SENSI... Sensitive <=0.5 SENSI... Sensitive Inducible Clindamycin NEGATIVE Sensitive NEGATIVE Sensitive OXACILLIN >=4 RESISTANT Resistant 2 SENSITIVE Sensitive RIFAMPIN <=0.5 SENSI... Sensitive <=0.5 SENSI... Sensitive TETRACYCLINE <=1 SENSITIVE Sensitive <=1 SENSITIVE Sensitive TRIMETH/SULFA <=10 SENSIT Sensitive <=10 SENSIT Sensitive ... Marland Kitchen.. VANCOMYCIN 1 SENSITIVE Sensitive <=0.5 SENSI... Sensitive Right foot bone . Component 3 wk ago Specimen Description BONE Special Requests RIGHT 4 METATARSAL SAMPLE B Gram Stain NO WBC SEEN NO ORGANISMS SEEN Culture RARE METHICILLIN RESISTANT STAPHYLOCOCCUS AUREUS NO ANAEROBES ISOLATED Performed at [facility], [place] [address], [place], [place] [address] Report Status 10/08/2019 FINAL Organism ID, Bacteria METHICILLIN RESISTANT STAPHYLOCOCCUS AUREUS Resulting Agency [facility] Susceptibility Methicillin resistant staphylococcus aureus MIC CIPROFLOXACIN >=8 RESISTANT Resistant CLINDAMYCIN <=0.25 SENS... Sensitive ERYTHROMYCIN >=8 RESISTANT Resistant GENTAMICIN <=0.5 SENSI... Sensitive Inducible Clindamycin NEGATIVE  Sensitive OXACILLIN >=4 RESISTANT Resistant RIFAMPIN <=0.5 SENSI... Sensitive TETRACYCLINE <=1 SENSITIVE Sensitive TRIMETH/SULFA <=10 SENSIT Sensitive ... VANCOMYCIN 1 SENSITIVE Sensitive This is a patient we had in clinic earlier this year with a wound over his left fifth metatarsal head. 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 Victorino Dike on 05/16/2019. He seems to have developed problems with wounds on his bilateral feet in June 2021 although he may have had problems earlier than this. He was in an urgent care with a right foot ulcer on 09/26/2019 and given a course of doxycycline. This was apparently after having trouble getting into see orthopedics. He was seen by podiatry on 09/28/2019 noted to have bilateral lower extremity ulcers including the left lateral fifth metatarsal base and the right subfifth met head. It was noted that had purulent drainage at that time. He required hospitalization from 6/20 through 7/2. This was because of worsening right foot wounds. He underwent bilateral operative incision and drainage and bone biopsies bilaterally. Culture results are listed above. He has been referred back to clinic by Dr. Ardelle Anton of podiatry. He is also followed by Dr. Orvan Falconer who saw him yesterday. He was discharged from hospital on Zyvox Flagyl and Levaquin and yesterday changed to doxycycline Flagyl and Levaquin. His inflammatory markers on 6/26 showed a sedimentation rate of 129 and a C-reactive protein of 5. This is improved to 14 and 1.3 respectively. This would indicate improvement. ABIs in our clinic today were 1.23 on the right and 1.20 on the left 11/01/2019 on evaluation today patient appears to be doing fairly well in regard to the wounds on his feet at this point. Fortunately there is no signs of active infection at this time. No fevers, chills, nausea, vomiting, or diarrhea. He currently is seeing infectious disease and still under their care at this point. Subsequently he also has both wounds which she has not been using collagen on as he  did not receive that in his packaging he did not call us and let us know that. Apparently that just was missed on the order. Nonetheless we will get that straightened out today. 8/9-Patient returns for bilateral foot wounds, using Prisma with hydrogel moistened dressings, and the wounds appear stable. Patient using surgical shoes, avoiding much pressure or weightbearing as much as possible 8/16; patient has bilateral foot wounds. 1 on the right lateral foot proximally the other is on the left mid lateral foot. Both required debridement of callus and thick skin around the wounds. We have been using silver collagen 8/27; patient has bilateral lateral foot wounds. The area on the left substantially surrounded by callus and dry skin. This was removed from the wound edge. The underlying wound is small. The area on the right measured somewhat smaller today. 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 Unfortunately I did not update his antibiotics during today's visit. 9/10 I reviewed Dr. Blair Dolphin last notes he felt he had completed antibiotics his inflammatory markers were reasonably well controlled. He has a small wound on the lateral left foot and a tiny area on the right which is just above closed. He is using Hydrofera Blue with border foam he has bilateral surgical shoes 9/24; 2  week f/u. doing well. right foot is closed. left foot still undermined. 10/14; right foot remains closed at the fifth met head. The area over the base of the left fifth metatarsal has a small open area but considerable undermining towards the plantar foot. Thick callus skin around this suggests an adequate pressure relief. We have talked about this. He says he is going to go back into his cam boot. I suggested a total contact cast he did not seem enamored with this suggestion 10/26; left foot base of the fifth metatarsal. Same condition as last time. He has  skin over the area with an open wound however the skin is not adherent. He went to see Dr. Earleen Newport who did an x-ray and culture of his foot I have not reviewed the x-ray but the patient was not told anything. He is on doxycycline 11/11; since the patient was last here he was in the emergency room on 10/30 he was concerned about swelling in the left foot. They did not do any cultures or x-rays. They changed his antibiotics to cephalexin. Previous culture showed group B strep. The cephalexin is appropriate as doxycycline has less than predictable coverage. Arrives in clinic today with swelling over this area under the wound. He also has a new wound on the right fifth metatarsal head 11/18; the patient has a difficult wound on the lateral aspect of the left fifth metatarsal head. The wound was almost ballotable last week I opened it slightly expecting to see purulence however there was just bleeding. I cultured this this was negative. X-ray unchanged. We are trying to get an MRI but I am not sure were going to be able to get this through his insurance. He also has an area on the right lateral fifth metatarsal head this looks healthier 12/3; the patient finally got our MRI. Surprisingly this did not show osteomyelitis. I did show the soft tissue ulceration at the lateral plantar aspect of the fifth metatarsal base with a tiny residual 6 mm abscess overlying the superficial fascia I have tried to culture this area I have not been able to get this to grow anything. Nevertheless the protruding tissue looks aggravated. I suspect we should try to treat the underlying "abscess with broad-spectrum antibiotics. I am going to start him on Levaquin and Flagyl. He has much less edema in his legs and I am going to continue to wrap his legs and see him weekly 12/10. I started Levaquin and Flagyl on him last week. He just picked up the Flagyl apparently there was some delay. The worry is the wound on the left  fifth metatarsal base which is substantial and worsening. His foot looks like he inverts at the ankle making this a weightbearing surface. Certainly no improvement in fact I think the measurements of this are somewhat worse. We have been using 12/17; he apparently just got the Levaquin yesterday this is 2 weeks after the fact. He has completed the Flagyl. The area over the left fifth metatarsal base still has protruding granulation tissue although it does not look quite as bad as it did some weeks ago. He has severe bilateral lymphedema although we have not been treating him for wounds on his legs this is definitely going to require compression. There was so much edema in the left I did not wish to put him in a total contact cast today. I am going to increase his compression from 3-4 layer. The area on the right lateral fifth met head  actually look quite good and superficial. 12/23; patient arrived with callus on the right fifth met head and the substantial hyper granulated callused wound on the base of his fifth metatarsal. He says he is completing his Levaquin in 2 days but I do not think that adds up with what I gave him but I will have to double check this. We are using Hydrofera Blue on both areas. My plan is to put the left leg in a cast the week after New Year's 04/06/2020; patient's wounds about the same. Right lateral fifth metatarsal head and left lateral foot over the base of the fifth metatarsal. There is undermining on the left lateral foot which I removed before application of total contact cast continuing with Hydrofera Blue new. Patient tells me he was seen by endocrinology today lab work was done [Dr. Kerr]. Also wondering whether he was referred to cardiology. I went over some lab work from previously does not have chronic renal failure certainly not nephrotic range proteinuria he does have very poorly controlled diabetes but this is not his most updated lab work. Hemoglobin A1c has  been over 11 1/10; the patient had a considerable amount of leakage towards mid part of his left foot with macerated skin however the wound surface looks better the area on the right lateral fifth met head is better as well. I am going to change the dressing on the left foot under the total contact cast to silver alginate, continue with Hydrofera Blue on the right. 1/20; patient was in the total contact cast for 10 days. Considerable amount of drainage although the skin around the wound does not look too bad on the left foot. The area on the right fifth metatarsal head is closed. Our nursing staff reports large amount of drainage out of the left lateral foot wound 1/25; continues with copious amounts of drainage described by our intake staff. PCR culture I did last week showed E. coli and Enterococcus faecalis and low quantities. Multiple resistance genes documented including extended spectrum beta lactamase, MRSA, MRSE, quinolone, tetracycline. The wound is not quite as good this week as it was 5 days ago but about the same size 2/3; continues with copious amounts of malodorous drainage per our intake nurse. The PCR culture I did 2 weeks ago showed E. coli and low quantities of Enterococcus. There were multiple resistance genes detected. I put Neosporin on him last week although this does not seem to have helped. The wound is slightly deeper today. Offloading continues to be an issue here although with the amount of drainage she has a total contact cast is just not going to work 2/10; moderate amount of drainage. Patient reports he cannot get his stocking on over the dressing. I told him we have to do that the nurse gave him suggestions on how to make this work. The wound is on the bottom and lateral part of his left foot. Is cultured predominantly grew low amounts of Enterococcus, E. coli and anaerobes. There were multiple resistance genes detected including extended spectrum beta lactamase,  quinolone, tetracycline. I could not think of an easy oral combination to address this so for now I am going to do topical antibiotics provided by [facility] I think the main agents here are vancomycin and an aminoglycoside. We have to be able to give him access to the wounds to get the topical antibiotic on 2/17; moderate amount of drainage this is unchanged. He has his Keystone topical antibiotic against the deep tissue  culture organisms. He has been using this and changing the dressing daily. Silver alginate on the wound surface. 2/24; using Keystone antibiotic with silver alginate on the top. He had too much drainage for a total contact cast at one point although I think that is improving and I think in the next week or 2 it might be possible to replace a total contact cast I did not do this today. In general the wound surface looks healthy however he continues to have thick rims of skin and subcutaneous tissue around the wide area of the circumference which I debrided Objective Constitutional Sitting or standing Blood Pressure is within target range for patient.. Pulse regular and within target range for patient.Marland Kitchen Respirations regular, non-labored and within target range.. Temperature is normal and within the target range for the patient.Marland Kitchen Appears in no distress. Vitals Time Taken: 8:04 AM, Height: 77 in, Weight: 280 lbs, BMI: 33.2, Temperature: 97.9 F, Pulse: 97 bpm, Respiratory Rate: 18 breaths/min, Blood Pressure: 127/82 mmHg, Capillary Blood Glucose: 101 mg/dl. Cardiovascular Pedal pulses are palpable. General Notes: Wound exam; left lateral fifth metatarsal base. The wound looks somewhat dry but again raised thick edges of skin and subcutaneous tissue around the margins I remove this with a #5 curette hemostasis with silver nitrate and a pressure dressing. Looking at his dressing from last night shows really not that much drainage. No evidence of infection around the wound  margin Integumentary (Hair, Skin) Wound #3 status is Open. Original cause of wound was Trauma. The date acquired was: 10/02/2019. The wound has been in treatment 31 weeks. The wound is located on the Left,Lateral Foot. The wound measures 2.6cm length x 2.5cm width x 0.9cm depth; 5.105cm^2 area and 4.595cm^3 volume. There is Fat Layer (Subcutaneous Tissue) exposed. There is no tunneling or undermining noted. There is a medium amount of serosanguineous drainage noted. The wound margin is thickened. There is large (67-100%) red, pink granulation within the wound bed. There is a small (1-33%) amount of necrotic tissue within the wound bed including Adherent Slough. 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 There was a Excisional Skin/Subcutaneous Tissue Debridement with a total area of 6.5 sq cm performed by [name], MD. With the following instrument(s): Curette to remove Viable and Non-Viable tissue/material. Material removed includes Subcutaneous Tissue, Skin: Dermis, Skin: Epidermis, and Fibrin/Exudate after achieving pain control using Lidocaine 4% Topical Solution. A time out was conducted at 08:50, prior to the start of the procedure. A Moderate amount of bleeding was controlled with Silver Nitrate. The procedure was tolerated well with a pain level of 0 throughout and a pain level of 0 following the procedure. Post Debridement Measurements: 2.6cm length x 2.5cm width x 0.9cm depth; 4.595cm^3 volume. Character of Wound/Ulcer Post Debridement is improved. Severity of Tissue Post Debridement is: Fat layer exposed. Post procedure Diagnosis Wound #3: Same as  Pre-Procedure Plan Follow-up Appointments: Return Appointment in 1 week. Bathing/ Shower/ Hygiene: May shower and wash wound with soap and water. Edema Control - Lymphedema / SCD / Other: Elevate legs to the level of the heart or above for 30 minutes daily and/or when sitting, a frequency of: - throughout the day Avoid standing for long periods of time. Exercise  regularly Moisturize legs daily. - both legs every night before bed. Compression stocking or Garment 20-30 mm/Hg pressure to: - Apply to both legs in the morning and remove at night. WOUND #3: - Foot Wound Laterality: Left, Lateral Cleanser: Normal Saline (DME) (Generic) 1 x Per Day/30 Days Discharge Instructions: Cleanse the wound with Normal Saline prior to applying a clean dressing using gauze sponges, not tissue or cotton balls. Cleanser: Soap and Water 1 x Per Day/30 Days Discharge Instructions: May shower and wash wound with dial antibacterial soap and water prior to dressing change. Peri-Wound Care: Zinc Oxide Ointment 30g tube 1 x Per Day/30 Days Discharge Instructions: Apply Zinc Oxide to periwound with each dressing change as needed. Peri-Wound Care: Sween Lotion (Moisturizing lotion) 1 x Per Day/30 Days Discharge Instructions: Apply moisturizing lotion as directed Topical: keystone antibiotics 1 x Per Day/30 Days Discharge Instructions: apply topically daily. Prim Dressing: KerraCel Ag Gelling Fiber Dressing, 4x5 in (silver alginate) 1 x Per Day/30 Days ary Discharge Instructions: Apply silver alginate over the keystone antibiotics to wound bed as instructed Secondary Dressing: ABD Pad, 8x10 1 x Per Day/30 Days Discharge Instructions: T down before applying kerlix. Apply over primary dressing as directed. ape Secured With: [employer], 4.5x3.1 (in/yd) (Generic) 1 x Per Day/30 Days Discharge Instructions: Secure with Kerlix as directed. Secured With: 54M Medipore H Soft Cloth Surgical T 4 x 2 (in/yd) (Generic)  1 x Per Day/30 Days ape Discharge Instructions: Secure dressing with tape as directed. Add-Ons: felt 1 x Per Day/30 Days Discharge Instructions: apply felt in shoe. 1. I continued with the silver alginate this week. If his drainage continues to improve then I will moved to a total contact cast next week. He has been using the [facility] antibiotic for 2 to 3 weeks now which was the other issue. I think we should be able to move back into a total contact cast perhaps with something more hydrating question silver collagen question Hydrofera Blue 2. No evidence of surrounding infection in general the wound bed looks better Electronic Signature(s) Signed: 05/28/2020 5:09:37 PM By: [name] MD Entered By: [name] on 05/28/2020 09:13:07 -------------------------------------------------------------------------------- SuperBill Details Patient Name: Date of Service: [name], [name] 05/28/2020 Medical Record Number: [id number] Patient Account Number: [id number] Date of Birth/Sex: Treating RN: Oct 03, 1985 (35 y.o. [name] Primary Care Provider: [name] Other Clinician: Referring Provider: Treating Provider/Extender: [name] in Treatment: 31 Diagnosis Coding ICD-10 Codes Code Description E11.621 Type 2 diabetes mellitus with foot ulcer L97.528 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ity Facility Procedures CPT4 Code: [id number] Description: 67341 - DEB SUBQ TISSUE 20 SQ CM/< ICD-10 Diagnosis Description E11.621 Type 2 diabetes mellitus with foot ulcer L97.528 Non-pressure chronic ulcer of other part of left foot with other specified seve Modifier: rity Quantity: 1 Physician Procedures : CPT4 Code Description Modifier [id number] - WC PHYS SUBQ TISS 20 SQ CM ICD-10 Diagnosis Description E11.621 Type 2 diabetes mellitus with foot ulcer L97.528 Non-pressure chronic ulcer of other part of left foot  with other specified severity Quantity: 1 Electronic Signature(s) Signed: 05/28/2020 5:09:37 PM By: [name] MD Entered By: [name] on 05/28/2020 09:13:20

## 2020-06-04 ENCOUNTER — Other Ambulatory Visit: Payer: Self-pay

## 2020-06-04 ENCOUNTER — Encounter (HOSPITAL_BASED_OUTPATIENT_CLINIC_OR_DEPARTMENT_OTHER): Payer: BC Managed Care – PPO | Attending: Internal Medicine | Admitting: Physician Assistant

## 2020-06-04 DIAGNOSIS — E11621 Type 2 diabetes mellitus with foot ulcer: Secondary | ICD-10-CM | POA: Diagnosis not present

## 2020-06-04 DIAGNOSIS — Z8616 Personal history of COVID-19: Secondary | ICD-10-CM | POA: Diagnosis not present

## 2020-06-04 DIAGNOSIS — I890 Lymphedema, not elsewhere classified: Secondary | ICD-10-CM | POA: Diagnosis not present

## 2020-06-04 DIAGNOSIS — L97528 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ity: Secondary | ICD-10-CM | POA: Insufficient documentation

## 2020-06-04 NOTE — Progress Notes (Addendum)
Dupriest, [name] ([id number]) Visit Report for 06/04/2020 Chief Complaint Document Details Patient Name: Date of Service: [name] D 06/04/2020 3:00 PM Medical Record Number: [id number] Patient Account Number: [id number] Date of Birth/Sex: Treating RN: 1985/08/01 (35 y.o. [name] Primary Care Provider: [name] Other Clinician: Referring Provider: Treating Provider/Extender: Jennye Moccasin in Treatment: 31 Information Obtained from: Patient Chief Complaint 01/11/2019; patient is here for review of a rather substantial wound over the left fifth plantar metatarsal head extending into the lateral part of his foot 10/24/2019; patient returns to clinic with wounds on his bilateral feet with underlying osteomyelitis biopsy-proven Electronic Signature(s) Signed: 06/04/2020 3:04:24 PM By: Worthy Keeler PA-C Entered By: Worthy Keeler on 06/04/2020 15:04:24 -------------------------------------------------------------------------------- Debridement Details Patient Name: Date of Service: [name], [name] 06/04/2020 3:00 PM Medical Record Number: [id number] Patient Account Number: [id number] Date of Birth/Sex: Treating RN: 10-22-1985 (35 y.o. [name], Meta.Reding Primary Care Provider: [name] Other Clinician: Referring Provider: Treating Provider/Extender: Jennye Moccasin in Treatment: 32 Debridement Performed for Assessment: Wound #3 Left,Lateral Foot Performed By: Physician Worthy Keeler, PA Debridement Type: Debridement Severity of Tissue Pre Debridement: Fat layer exposed Level of Consciousness (Pre-procedure): Awake and Alert Pre-procedure Verification/Time Out Yes - 16:35 Taken: Start Time: 16:36 Pain Control: Lidocaine 4% T opical Solution T Area Debrided (L x W): otal 4 (cm) x 3 (cm) = 12 (cm) Tissue and other material debrided: Viable, Non-Viable, Callus, Slough, Subcutaneous, Skin: Dermis , Skin: Epidermis,  Fibrin/Exudate, Slough Level: Skin/Subcutaneous Tissue Debridement Description: Excisional Instrument: Curette, Forceps, Scissors Bleeding: Minimum Hemostasis Achieved: Pressure End Time: 16:41 Procedural Pain: 0 Post Procedural Pain: 0 Response to Treatment: Procedure was tolerated well Level of Consciousness (Post- Awake and Alert procedure): Post Debridement Measurements of Total Wound Length: (cm) 3 Width: (cm) 2.5 Depth: (cm) 0.6 Volume: (cm) 3.534 Character of Wound/Ulcer Post Debridement: Improved Severity of Tissue Post Debridement: Fat layer exposed Post Procedure Diagnosis Same as Pre-procedure Electronic Signature(s) Signed: 06/04/2020 5:31:07 PM By: Worthy Keeler PA-C Signed: 06/04/2020 5:49:32 PM By: Deon Pilling Entered By: Deon Pilling on 06/04/2020 16:42:28 -------------------------------------------------------------------------------- HPI Details Patient Name: Date of Service: [name], [name] 06/04/2020 3:00 PM Medical Record Number: [id number] Patient Account Number: [id number] Date of Birth/Sex: Treating RN: 05-18-1985 (35 y.o. 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 He comes in for review of a wound over the plantar fifth metatarsal head extending into the lateral part of the foot. He was followed for this previously by his podiatrist Dr. Cornelius Moras. As the patient tells his story he went to see podiatry first for a swelling he developed on the lateral part of his fifth metatarsal head in May. He states this was "open" by podiatry and the area closed. He was followed up in June and it was again opened callus removed and it closed promptly. There were plans being made for surgery  on the fifth metatarsal head in June however his blood sugar was apparently too high for anesthesia. Apparently the area was debrided and opened again in June and it is never closed since. Looking over the records from podiatry I am really not able to follow this. It was clear when he was first seen  it was before 5/14 at that point he already had a wound. By 5/17 the ulcer was resolved. I do not see anything about a procedure. On 5/28 noted to have pre-ulcerative moderate keratosis. X-ray noted 1/5 contracted toe and tailor's bunion and metatarsal deformity. On a visit date on 09/28/2018 the dorsal part of the left foot it healed and resolved. There was concern about swelling in his lower extremity he was sent to the ER.. As far as I can tell he was seen in the ER on 7/12 with an ulcer on his left foot. A DVT rule out of the left leg was negative. I do not think I have complete records from podiatry but I am not able to verify the procedures this patient states he had. He states after the last procedure the wound has never closed although I am not able to follow this in the records I have from podiatry. He has not had a recent x-ray The patient has been using Neosporin on the wound. He is wearing a Darco shoe. He is still very active up on his foot working and exercising. Past medical history; type 2 diabetes ketosis-prone, leg swelling with a negative DVT study in July. Non-smoker ABI in our clinic was 0.85 on the left 10/16; substantial wound on the plantar left fifth met head extending laterally almost to the dorsal fifth MTP. We have been using silver alginate we gave him a Darco forefoot off loader. An x-ray did not show evidence of osteomyelitis did note soft tissue emphysema which I think was due to gas tracking through an open wound. There is no doubt in my mind he requires an MRI 10/23; MRI not booked until 3 November at the earliest this is largely due to his glucose sensor in the right arm.  We have been using silver alginate. There has been an improvement 10/29; I am still not exactly sure when his MRI is booked for. He says it is the third but it is the 10th in epic. This definitely needs to be done. He is running a low-grade fever today but no other symptoms. No real improvement in the 1 02/26/2019 patient presents today for a follow-up visit here in our clinic he is last been seen in the clinic on October 29. Subsequently we were working on getting MRI to evaluate and see what exactly was going on and where we would need to go from the standpoint of whether or not he had osteomyelitis and again what treatments were going be required. Subsequently the patient ended up being admitted to the hospital on 02/07/2019 and was discharged on 02/14/2019. This is a somewhat interesting admission with a discharge diagnosis of pneumonia due to COVID-19 although he was positive for COVID-19 when tested at the urgent care but negative x2 when he was actually in the hospital. With that being said he did have acute respiratory failure with hypoxia and it was noted he also have a left foot ulceration with osteomyelitis. With that being said he did require oxygen for his pneumonia and I level 4 L. He was placed on antivirals and steroids for the COVID-19. He was also transferred to the [facility] at one point. Nonetheless he did subsequently discharged home and since being home has done much better in that regard. The CT angiogram did not show any pulmonary embolism. With regard to the osteomyelitis the patient was placed on vancomycin and Zosyn while in the hospital but has been changed to Augmentin at  discharge. It was also recommended that he follow- up with wound care and podiatry. Podiatry however wanted him to see [name] according to the patient prior to them doing anything further. His hemoglobin A1c was 9.9 as noted in the hospital. Have an MRI of the left foot performed while in the hospital  on 02/04/2019. 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 There is an overlying plantar open wound noted an abscess tracking back along the lateral aspect of the fifth metatarsal shaft. There is otherwise diffuse cellulitis and mild fasciitis without findings of polymyositis. The patient did have recently pneumonia secondary to COVID-19 I looked in the chart through epic and it does appear that the patient may need to have an additional x-ray just to ensure everything is cleared and that he has no airspace disease prior to putting him into the [name]. 03/05/2019; patient was readmitted to the clinic last week. He was hospitalized twice for a viral upper respiratory tract infection from 11/1 through 11/4 and then 11/5 through 11/12 ultimately this turned out to be Covid pneumonitis. Although he was discharged on oxygen he is not using it. He says he feels fine. He has no exercise limitation no cough no sputum. His O2 sat in our clinic today was 100% on room air. 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 He received Vanco and Zosyn in the hospital and then was discharged on 2 weeks of Augmentin. I do not see any relevant cultures. He was supposed to follow-up with infectious disease but I do not see that he has an appointment. 12/8; patient saw Dr. Novella Olive of infectious disease last week. He felt that he had had adequate antibiotic therapy. He did not go to follow-up with Dr. Amalia Hailey of podiatry and I have again talked to him about the pros and cons of this. He does not want to consider a ray amputation of this time. He is aware of the risks of recurrence, migration etc. He started HBO today and tolerated this well. He can complete the Augmentin that I gave him last week. I have looked over the lab  work that Dr. Chana Bode ordered his C-reactive protein was 3.3 and his sedimentation rate was 17. The C-reactive protein is never really been measurably that high in this patient 12/15; not much change in the wound today however he has undermining along the lateral part of the foot again more extensively than last week. He has some rims of epithelialization. We have been using silver alginate. He is undergoing hyperbarics but did not dive today 12/18; in for his obligatory first total contact cast change. Unfortunately there was pus coming from the undermining area around his fifth metatarsal head. This was cultured but will preclude reapplication of a cast. He is seen in conjunction with HBO 12/24; patient had staph lugdunensis in the wound in the undermining area laterally last time. We put him on doxycycline which should have covered this. The wound looks better today. I am going to give him another week of doxycycline before reattempting the total contact cast 12/31; the patient is completing antibiotics. Hemorrhagic debris in the distal part of the wound with some undermining distally. He also had hyper granulation. Extensive debridement with a #5 curette. The infected area that was on the lateral part of the fifth met head  is closed over. I do not think he needs any more antibiotics. Patient was seen prior to HBO. Preparations for a total contact cast were made in the cast will be placed post hyperbarics 04/11/19; once again the patient arrives today without complaint. He had been in a cast all week noted that he had heavy drainage this week. This resulted in large raised areas of macerated tissue around the wound 1/14; wound bed looks better slightly smaller. Hydrofera Blue has been changing himself. He had a heavy drainage last week which caused a lot of maceration around the wound so I took him out of a total contact cast he says the drainage is actually better this week He is seen today in  conjunction with HBO 1/21; returns to clinic. He was up in [place] for a day or 2 attending a funeral. He comes back in with the wound larger and with a large area of exposed bone. He had osteomyelitis and septic arthritis of the fifth left metatarsal head while he was in hospital. He received IV antibiotics in the hospital for a prolonged period of time then 3 weeks of Augmentin. Subsequently I gave him 2 weeks of doxycycline for more superficial wound infection. When I saw this last week the wound was smaller the surface of the wound looks satisfactory. 1/28; patient missed hyperbarics today. Bone biopsy I did last time showed Enterococcus faecalis and Staphylococcus lugdunensis . He has a wide area of exposed bone. We are going to use silver alginate as of today. I had another ethical discussion with the patient. 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 In this situation I think the likelihood of healing this is low. Therefore I have recommended a ray amputation and with the patient's agreement I have referred him to Dr. Doran Durand. The other issue is that his compliance with hyperbarics has been minimal because of his work schedule and given his underlying decision I am going to stop this today READMISSION 10/24/2019 MRI 09/29/2019 left foot IMPRESSION: 1. Apparent skin ulceration inferior and lateral to the 5th metatarsal base with underlying heterogeneous T2 signal and enhancement in the subcutaneous fat. Small peripherally enhancing fluid collections along the plantar and lateral aspects of the 5th metatarsal base suspicious for abscesses. 2. Interval amputation through the mid 5th metatarsal with nonspecific low-level marrow edema and enhancement. Given the proximity to the adjacent soft tissue inflammatory changes, osteomyelitis cannot be excluded. 3. The additional bones appear unremarkable. MRI 09/29/2019 right foot IMPRESSION: 1. Soft tissue  ulceration lateral to the 5th MTP joint. There is low-level T2 hyperintensity within the 4th and 5th metatarsal heads and adjacent proximal phalanges without abnormal T1 signal or cortical destruction. These findings are nonspecific and could be seen with early marrow edema, hyperemia or early osteomyelitis. No evidence of septic joint. 2. Mild tenosynovitis and synovial enhancement associated with the extensor digitorum tendons at the level of the midfoot. 3. Diffuse low-level muscular T2 hyperintensity and enhancement, most consistent with diabetic myopathy. LEFT FOOT BONE Methicillin resistant staphylococcus aureus Staphylococcus lugdunensis MIC MIC CIPROFLOXACIN >=8 RESISTANT Resistant <=0.5 SENSI... Sensitive CLINDAMYCIN <=0.25 SENS... Sensitive >=8 RESISTANT Resistant ERYTHROMYCIN >=8 RESISTANT Resistant >=8 RESISTANT Resistant GENTAMICIN <=0.5 SENSI... Sensitive <=0.5 SENSI... Sensitive Inducible Clindamycin NEGATIVE Sensitive NEGATIVE Sensitive OXACILLIN >=4 RESISTANT Resistant 2 SENSITIVE Sensitive RIFAMPIN <=0.5 SENSI... Sensitive <=0.5 SENSI... Sensitive TETRACYCLINE <=1 SENSITIVE Sensitive <=1 SENSITIVE Sensitive TRIMETH/SULFA <=10 SENSIT Sensitive <=10 SENSIT Sensitive ... Marland Kitchen.. VANCOMYCIN 1 SENSITIVE Sensitive <=0.5 SENSI... Sensitive Right foot  bone . Component 3 wk ago Specimen Description BONE Special Requests RIGHT 4 METATARSAL SAMPLE B Gram Stain NO WBC SEEN NO ORGANISMS SEEN Culture RARE METHICILLIN RESISTANT STAPHYLOCOCCUS AUREUS NO ANAEROBES ISOLATED Performed at [facility], [place] [address] [place], [place] [address] Report Status 10/08/2019 FINAL Organism ID, Bacteria METHICILLIN RESISTANT STAPHYLOCOCCUS AUREUS Resulting Agency [facility] Susceptibility Methicillin resistant staphylococcus aureus MIC CIPROFLOXACIN >=8 RESISTANT Resistant CLINDAMYCIN <=0.25 SENS... Sensitive ERYTHROMYCIN >=8 RESISTANT Resistant GENTAMICIN <=0.5 SENSI...  Sensitive Inducible Clindamycin NEGATIVE Sensitive OXACILLIN >=4 RESISTANT Resistant RIFAMPIN <=0.5 SENSI... Sensitive TETRACYCLINE <=1 SENSITIVE Sensitive TRIMETH/SULFA <=10 SENSIT Sensitive ... VANCOMYCIN 1 SENSITIVE Sensitive This is a patient we had in clinic earlier this year with a wound over his left fifth metatarsal head. 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 Doran Durand on 05/16/2019. He seems to have developed problems with wounds on his bilateral feet in June 2021 although he may have had problems earlier than this. He was in an urgent care with a right foot ulcer on 09/26/2019 and given a course of doxycycline. This was apparently after having trouble getting into see orthopedics. He was seen by podiatry on 09/28/2019 noted to have bilateral lower extremity ulcers including the left lateral fifth metatarsal base and the right subfifth met head. It was noted that had purulent drainage at that time. He required hospitalization from 6/20 through 7/2. This was because of worsening right foot wounds. He underwent bilateral operative incision and drainage and bone biopsies bilaterally. Culture results are listed above. He has been referred back to clinic by Dr. Jacqualyn Posey of podiatry. He is also followed by Dr. Megan Salon who saw him yesterday. He was discharged from hospital on Zyvox Flagyl and Levaquin and yesterday changed to doxycycline Flagyl and Levaquin. His inflammatory markers on 6/26 showed a sedimentation rate of 129 and a C-reactive protein of 5. This is improved to 14 and 1.3 respectively. This would indicate improvement. ABIs in our clinic today were 1.23 on the right and 1.20 on the left 11/01/2019 on  evaluation today patient appears to be doing fairly well in regard to the wounds on his feet at this point. Fortunately there is no signs of active infection at this time. No fevers, chills, nausea, vomiting, or diarrhea. He currently is seeing infectious disease and still under their care at this point. Subsequently he also has both wounds which she has not been using collagen on as he did not receive that in his packaging he did not call us and let us know that. Apparently that just was missed on the order. Nonetheless we will get that straightened out today. 8/9-Patient returns for bilateral foot wounds, using Prisma with hydrogel moistened dressings, and the wounds appear stable. Patient using surgical shoes, avoiding much pressure or weightbearing as much as possible 8/16; patient has bilateral foot wounds. 1 on the right lateral foot proximally the other is on the left mid lateral foot. Both required debridement of callus and thick skin around the wounds. We have been using silver collagen 8/27; patient has bilateral lateral foot wounds. The area on the left substantially surrounded by callus and dry skin. This was removed from the wound edge. The underlying wound is small. The area on the right measured  somewhat smaller today. 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 Unfortunately I did not update his antibiotics during today's visit. 9/10 I reviewed Dr. Hale Bogus last notes he felt he had completed antibiotics his inflammatory markers were reasonably well controlled. He has a small wound on the lateral left foot and a tiny area on the right which is just above closed. He is using Hydrofera Blue with border foam he has bilateral surgical shoes 9/24; 2 week f/u. doing well. right foot is closed. left foot still undermined. 10/14; right foot remains closed at the fifth met head. The area over the base of the left fifth  metatarsal has a small open area but considerable undermining towards the plantar foot. Thick callus skin around this suggests an adequate pressure relief. We have talked about this. He says he is going to go back into his cam boot. I suggested a total contact cast he did not seem enamored with this suggestion 10/26; left foot base of the fifth metatarsal. Same condition as last time. He has skin over the area with an open wound however the skin is not adherent. He went to see Dr. Earleen Newport who did an x-ray and culture of his foot I have not reviewed the x-ray but the patient was not told anything. He is on doxycycline 11/11; since the patient was last here he was in the emergency room on 10/30 he was concerned about swelling in the left foot. They did not do any cultures or x-rays. They changed his antibiotics to cephalexin. Previous culture showed group B strep. The cephalexin is appropriate as doxycycline has less than predictable coverage. Arrives in clinic today with swelling over this area under the wound. He also has a new wound on the right fifth metatarsal head 11/18; the patient has a difficult wound on the lateral aspect of the left fifth metatarsal head. The wound was almost ballotable last week I opened it slightly expecting to see purulence however there was just bleeding. I cultured this this was negative. X-ray unchanged. We are trying to get an MRI but I am not sure were going to be able to get this through his insurance. He also has an area on the right lateral fifth metatarsal head this looks healthier 12/3; the patient finally got our MRI. Surprisingly this did not show osteomyelitis. I did show the soft tissue ulceration at the lateral plantar aspect of the fifth metatarsal base with a tiny residual 6 mm abscess overlying the superficial fascia I have tried to culture this area I have not been able to get this to grow anything. Nevertheless the protruding tissue looks aggravated. I  suspect we should try to treat the underlying "abscess with broad-spectrum antibiotics. I am going to start him on Levaquin and Flagyl. He has much less edema in his legs and I am going to continue to wrap his legs and see him weekly 12/10. I started Levaquin and Flagyl on him last week. He just picked up the Flagyl apparently there was some delay. The worry is the wound on the left fifth metatarsal base which is substantial and worsening. His foot looks like he inverts at the ankle making this a weightbearing surface. Certainly no improvement in fact I think the measurements of this are somewhat worse. We have been using 12/17; he apparently just got the Levaquin yesterday this is 2 weeks after the fact. He has completed the Flagyl. The area over the  left fifth metatarsal base still has protruding granulation tissue although it does not look quite as bad as it did some weeks ago. He has severe bilateral lymphedema although we have not been treating him for wounds on his legs this is definitely going to require compression. There was so much edema in the left I did not wish to put him in a total contact cast today. I am going to increase his compression from 3-4 layer. The area on the right lateral fifth met head actually look quite good and superficial. 12/23; patient arrived with callus on the right fifth met head and the substantial hyper granulated callused wound on the base of his fifth metatarsal. He says he is completing his Levaquin in 2 days but I do not think that adds up with what I gave him but I will have to double check this. We are using Hydrofera Blue on both areas. My plan is to put the left leg in a cast the week after New Year's 04/06/2020; patient's wounds about the same. Right lateral fifth metatarsal head and left lateral foot over the base of the fifth metatarsal. There is undermining on the left lateral foot which I removed before application of total contact cast continuing with  Hydrofera Blue new. Patient tells me he was seen by endocrinology today lab work was done [Dr. Kerr]. Also wondering whether he was referred to cardiology. I went over some lab work from previously does not have chronic renal failure certainly not nephrotic range proteinuria he does have very poorly controlled diabetes but this is not his most updated lab work. Hemoglobin A1c has been over 11 1/10; the patient had a considerable amount of leakage towards mid part of his left foot with macerated skin however the wound surface looks better the area on the right lateral fifth met head is better as well. I am going to change the dressing on the left foot under the total contact cast to silver alginate, continue with Hydrofera Blue on the right. 1/20; patient was in the total contact cast for 10 days. Considerable amount of drainage although the skin around the wound does not look too bad on the left foot. The area on the right fifth metatarsal head is closed. Our nursing staff reports large amount of drainage out of the left lateral foot wound 1/25; continues with copious amounts of drainage described by our intake staff. PCR culture I did last week showed E. coli and Enterococcus faecalis and low quantities. Multiple resistance genes documented including extended spectrum beta lactamase, MRSA, MRSE, quinolone, tetracycline. The wound is not quite as good this week as it was 5 days ago but about the same size 2/3; continues with copious amounts of malodorous drainage per our intake nurse. The PCR culture I did 2 weeks ago showed E. coli and low quantities of Enterococcus. There were multiple resistance genes detected. I put Neosporin on him last week although this does not seem to have helped. The wound is slightly deeper today. Offloading continues to be an issue here although with the amount of drainage she has a total contact cast is just not going to work 2/10; moderate amount of drainage. Patient  reports he cannot get his stocking on over the dressing. I told him we have to do that the nurse gave him suggestions on how to make this work. The wound is on the bottom and lateral part of his left foot. Is cultured predominantly grew low amounts of Enterococcus, E. coli  and anaerobes. There were multiple resistance genes detected including extended spectrum beta lactamase, quinolone, tetracycline. I could not think of an easy oral combination to address this so for now I am going to do topical antibiotics provided by [facility] I think the main agents here are vancomycin and an aminoglycoside. We have to be able to give him access to the wounds to get the topical antibiotic on 2/17; moderate amount of drainage this is unchanged. He has his Keystone topical antibiotic against the deep tissue culture organisms. He has been using this and changing the dressing daily. Silver alginate on the wound surface. 2/24; using Keystone antibiotic with silver alginate on the top. He had too much drainage for a total contact cast at one point although I think that is improving and I think in the next week or 2 it might be possible to replace a total contact cast I did not do this today. In general the wound surface looks healthy however he continues to have thick rims of skin and subcutaneous tissue around the wide area of the circumference which I debrided 06/04/2020 upon evaluation today patient appears to be doing well in regard to his wound. I do feel like he is showing signs of improvement. There is little bit of callus and dead tissue around the edges of the wound as well as what appears to be a little bit of a sinus tract that is off to the side laterally I would perform debridement to clear that away today. Electronic Signature(s) Signed: 06/04/2020 4:53:02 PM By: Worthy Keeler PA-C Entered By: Worthy Keeler on 06/04/2020  16:53:02 -------------------------------------------------------------------------------- Physical Exam Details Patient Name: Date of Service: [name], [name] 06/04/2020 3:00 PM Medical Record Number: [id number] Patient Account Number: [id number] Date of Birth/Sex: Treating RN: 1986/03/30 (35 y.o. [name] Primary Care Provider: [name] Other Clinician: Referring Provider: Treating Provider/Extender: Jennye Moccasin in Treatment: 25 Constitutional Well-nourished and well-hydrated in no acute distress. Respiratory normal breathing without difficulty. Psychiatric this patient is able to make decisions and demonstrates good insight into disease process. Alert and Oriented x 3. pleasant and cooperative. Notes Patient's wound bed actually showed signs of pretty good granulation at this time. There does not appear to be any evidence of active infection which is great news and overall very pleased in that regard. Fortunately I think that he does seem to be making some progress here although it is slow again we discussed the possibility of a total contact cast in the future. With that being said I think that this is still not quite at the point of reinitiating that yet we want to get some of the drainage under better control. Electronic Signature(s) Signed: 06/04/2020 4:53:35 PM By: Worthy Keeler PA-C Entered By: Worthy Keeler on 06/04/2020 16:53:35 -------------------------------------------------------------------------------- Physician Orders Details Patient Name: Date of Service: A RMSTRO [name], [name] 06/04/2020 3:00 PM Medical Record Number: [id number] Patient Account Number: [id number] Date of Birth/Sex: Treating RN: 07/15/1985 (35 y.o. 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 Bathing/ Shower/ Hygiene May shower and wash wound with soap and water. Edema Control - Lymphedema / SCD /  Other Bilateral Lower Extremities Elevate legs to the level of the heart or above for 30 minutes daily and/or when sitting, a frequency of: - throughout the day Avoid standing for long periods of time. Exercise regularly Moisturize legs daily. - both legs every night before bed. Compression stocking or Garment 20-30 mm/Hg pressure to: - Apply to both legs in the morning and remove at night. Wound Treatment Wound #3 - Foot Wound Laterality: Left, Lateral Cleanser: Normal Saline (Generic) 1 x Per Day/30 Days Discharge Instructions: Cleanse the wound with Normal Saline prior to applying a clean dressing using gauze sponges, not tissue or cotton balls. Cleanser: Soap and Water 1 x Per Day/30 Days Discharge Instructions: May shower and wash wound with dial antibacterial soap and water prior to dressing change. Peri-Wound Care: Zinc Oxide Ointment 30g tube 1 x Per Day/30 Days Discharge Instructions: Apply Zinc Oxide to periwound with each dressing change as needed. Peri-Wound Care: Sween Lotion (Moisturizing lotion) 1 x Per Day/30 Days Discharge Instructions: Apply moisturizing lotion as directed Topical: keystone antibiotics 1 x Per Day/30 Days Discharge Instructions: apply topically daily. Prim Dressing: KerraCel Ag Gelling Fiber Dressing, 4x5 in (silver alginate) 1 x Per Day/30 Days ary Discharge Instructions: Apply silver alginate over the keystone antibiotics to wound bed as instructed Secondary Dressing: ABD Pad, 8x10 1 x Per Day/30 Days Discharge Instructions: T down before applying kerlix. Apply over primary dressing as directed. ape Secured With:  [employer], 4.5x3.1 (in/yd) (Generic) 1 x Per Day/30 Days Discharge Instructions: Secure with Kerlix as directed. Secured With: 69M Medipore H Soft Cloth Surgical T 4 x 2 (in/yd) (Generic) 1 x Per Day/30 Days ape Discharge Instructions: Secure dressing with tape as directed. Add-Ons: felt 1 x Per Day/30 Days Discharge Instructions: apply felt in shoe. Electronic Signature(s) Signed: 06/04/2020 5:31:07 PM By: Worthy Keeler PA-C Signed: 06/04/2020 5:49:32 PM By: Deon Pilling Entered By: Deon Pilling on 06/04/2020 16:50:23 -------------------------------------------------------------------------------- Problem List Details Patient Name: Date of Service: [name], [name] 06/04/2020 3:00 PM Medical Record Number: [id number] Patient Account Number: [id number] Date of Birth/Sex: Treating RN: 04-01-1986 (35 y.o. 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, right ankle and foot 10/24/2019 10/24/2019 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, left ankle and foot 10/24/2019 10/24/2019 B95.62 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 10/24/2019 10/24/2019 classified elsewhere Resolved Problems Electronic Signature(s) Signed: 06/04/2020 3:04:03 PM By: Worthy Keeler PA-C Entered By: Worthy Keeler on 06/04/2020  15:04:02 -------------------------------------------------------------------------------- Progress Note Details Patient Name: Date of Service: A RMSTRO [name], [name] 06/04/2020 3:00 PM Medical Record Number: [id number] Patient Account Number: [id number] Date of Birth/Sex: Treating RN: Jun 28, 1985 (35 y.o. 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 He comes in for review of a wound over the plantar fifth metatarsal head extending into the lateral part of the foot. He was followed for this previously by his podiatrist Dr. Cornelius Moras. As the patient tells his story he went to see podiatry first for a swelling he developed on the lateral part of his fifth metatarsal head in May. He states this was "open" by podiatry and the area closed. He was followed up in June and it was again opened callus removed and it closed promptly. There were plans being made for surgery on the fifth metatarsal head in June however his blood sugar was apparently too high for anesthesia. Apparently the area was debrided and opened again in June and it is never closed since. Looking over the records from podiatry I am really not able to follow this. It was clear when he was first seen it was before 5/14 at that point he already had a wound. By 5/17 the ulcer was resolved. I do not see anything about a procedure. On 5/28 noted to have  pre-ulcerative moderate keratosis. X-ray noted 1/5 contracted toe and tailor's bunion and metatarsal deformity. On a visit date on 09/28/2018 the dorsal part of the left foot it healed and resolved. There was concern about swelling in his lower extremity he was sent to the ER.. As far as I can tell he was seen in the ER on 7/12 with an ulcer on his left foot. A DVT rule out of the left leg was negative. I do not think I have complete records from podiatry but I am not able to verify the procedures this patient states he had. He states after the last procedure the wound has never closed although I am not able to follow this in the records I have from podiatry. He has not had a recent x-ray The patient has been using Neosporin on the wound. He is wearing a Darco shoe. He is still very active up on his foot working and exercising. Past medical history; type 2 diabetes ketosis-prone, leg swelling with a negative DVT study in July. Non-smoker ABI in our clinic was 0.85 on the left 10/16; substantial wound on the plantar left fifth met head extending laterally almost to the dorsal fifth MTP. We have been using silver alginate we gave him a Darco forefoot off loader. An x-ray did not show evidence of osteomyelitis did note soft tissue emphysema which I think was due to gas tracking through an open wound. There is no doubt in my mind he requires an MRI 10/23; MRI not booked until 3 November at the earliest this is largely due to his glucose sensor in the right arm. We have been using silver alginate. There has been an improvement 10/29; I am still not exactly sure when his MRI is booked for. He says it is the third but it is the 10th in epic. This definitely needs to be done. He is running a low-grade fever today but no other symptoms. No real improvement in the 1 02/26/2019 patient presents today for a follow-up visit here in our clinic he is last been seen in the clinic on October 29. Subsequently we were  working on getting MRI to evaluate and see what exactly was going on and where we would need  to go from the standpoint of whether or not he had osteomyelitis and again what treatments were going be required. Subsequently the patient ended up being admitted to the hospital on 02/07/2019 and was discharged on 02/14/2019. This is a somewhat interesting admission with a discharge diagnosis of pneumonia due to COVID-19 although he was positive for COVID-19 when tested at the urgent care but negative x2 when he was actually in the hospital. With that being said he did have acute respiratory failure with hypoxia and it was noted he also have a left foot ulceration with osteomyelitis. With that being said he did require oxygen for his pneumonia and I level 4 L. He was placed on antivirals and steroids for the COVID-19. He was also transferred to the [facility] at one point. Nonetheless he did subsequently discharged home and since being home has done much better in that regard. The CT angiogram did not show any pulmonary embolism. With regard to the osteomyelitis the patient was placed on vancomycin and Zosyn while in the hospital but has been changed to Augmentin at discharge. It was also recommended that he follow- up with wound care and podiatry. Podiatry however wanted him to see [name] according to the patient prior to them doing anything further. His hemoglobin A1c was 9.9 as noted in the hospital. Have an MRI of the left foot performed while in the hospital on 02/04/2019. 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 There is an overlying plantar open wound noted an abscess tracking back along the lateral aspect of the fifth metatarsal shaft. There is otherwise diffuse cellulitis and mild fasciitis without findings of polymyositis. The patient did have recently pneumonia secondary to COVID-19 I looked in the  chart through epic and it does appear that the patient may need to have an additional x-ray just to ensure everything is cleared and that he has no airspace disease prior to putting him into the [name]. 03/05/2019; patient was readmitted to the clinic last week. He was hospitalized twice for a viral upper respiratory tract infection from 11/1 through 11/4 and then 11/5 through 11/12 ultimately this turned out to be Covid pneumonitis. Although he was discharged on oxygen he is not using it. He says he feels fine. He has no exercise limitation no cough no sputum. His O2 sat in our clinic today was 100% on room air. 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 He received Vanco and Zosyn in the hospital and then was discharged on 2 weeks of Augmentin. I do not see any relevant cultures. He was supposed to follow-up with infectious disease but I do not see that he has an appointment. 12/8; patient saw Dr. Novella Olive of infectious disease last week. He felt that he had had adequate antibiotic therapy. He did not go to follow-up with Dr. Amalia Hailey of podiatry and I have again talked to him about the pros and cons of this. He does not want to consider a ray amputation of this time. He is aware of the risks of recurrence, migration etc. He started HBO today and tolerated this well. He can complete the Augmentin that I gave him last week. I have looked over the lab work that Dr. Chana Bode ordered his C-reactive protein was 3.3 and his sedimentation rate was 17. The C-reactive protein is  never really been measurably that high in this patient 12/15; not much change in the wound today however he has undermining along the lateral part of the foot again more extensively than last week. He has some rims of epithelialization. We have been using silver alginate. He is undergoing hyperbarics but did not dive  today 12/18; in for his obligatory first total contact cast change. Unfortunately there was pus coming from the undermining area around his fifth metatarsal head. This was cultured but will preclude reapplication of a cast. He is seen in conjunction with HBO 12/24; patient had staph lugdunensis in the wound in the undermining area laterally last time. We put him on doxycycline which should have covered this. The wound looks better today. I am going to give him another week of doxycycline before reattempting the total contact cast 12/31; the patient is completing antibiotics. Hemorrhagic debris in the distal part of the wound with some undermining distally. He also had hyper granulation. Extensive debridement with a #5 curette. The infected area that was on the lateral part of the fifth met head is closed over. I do not think he needs any more antibiotics. Patient was seen prior to HBO. Preparations for a total contact cast were made in the cast will be placed post hyperbarics 04/11/19; once again the patient arrives today without complaint. He had been in a cast all week noted that he had heavy drainage this week. This resulted in large raised areas of macerated tissue around the wound 1/14; wound bed looks better slightly smaller. Hydrofera Blue has been changing himself. He had a heavy drainage last week which caused a lot of maceration around the wound so I took him out of a total contact cast he says the drainage is actually better this week He is seen today in conjunction with HBO 1/21; returns to clinic. He was up in [place] for a day or 2 attending a funeral. He comes back in with the wound larger and with a large area of exposed bone. He had osteomyelitis and septic arthritis of the fifth left metatarsal head while he was in hospital. He received IV antibiotics in the hospital for a prolonged period of time then 3 weeks of Augmentin. Subsequently I gave him 2 weeks of doxycycline for more  superficial wound infection. When I saw this last week the wound was smaller the surface of the wound looks satisfactory. 1/28; patient missed hyperbarics today. Bone biopsy I did last time showed Enterococcus faecalis and Staphylococcus lugdunensis . He has a wide area of exposed bone. We are going to use silver alginate as of today. I had another ethical discussion with the patient. 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 In this situation I think the likelihood of healing this is low. Therefore I have recommended a ray amputation and with the patient's agreement I have referred him to Dr. Doran Durand. The other issue is that his compliance with hyperbarics has been minimal because of his work schedule and given his underlying decision I am going to stop this today READMISSION 10/24/2019 MRI 09/29/2019 left foot IMPRESSION: 1. Apparent skin ulceration inferior and lateral to the 5th metatarsal base with underlying heterogeneous T2 signal and enhancement in the subcutaneous fat. Small peripherally enhancing fluid collections along the plantar and lateral aspects of the 5th metatarsal base suspicious for abscesses. 2. Interval amputation through the mid 5th metatarsal with nonspecific low-level marrow edema and enhancement. Given the proximity to the adjacent soft tissue  inflammatory changes, osteomyelitis cannot be excluded. 3. The additional bones appear unremarkable. MRI 09/29/2019 right foot IMPRESSION: 1. Soft tissue ulceration lateral to the 5th MTP joint. There is low-level T2 hyperintensity within the 4th and 5th metatarsal heads and adjacent proximal phalanges without abnormal T1 signal or cortical destruction. These findings are nonspecific and could be seen with early marrow edema, hyperemia or early osteomyelitis. No evidence of septic joint. 2. Mild tenosynovitis and synovial enhancement associated with the extensor digitorum tendons at the  level of the midfoot. 3. Diffuse low-level muscular T2 hyperintensity and enhancement, most consistent with diabetic myopathy. LEFT FOOT BONE Methicillin resistant staphylococcus aureus Staphylococcus lugdunensis MIC MIC CIPROFLOXACIN >=8 RESISTANT Resistant <=0.5 SENSI... Sensitive CLINDAMYCIN <=0.25 SENS... Sensitive >=8 RESISTANT Resistant ERYTHROMYCIN >=8 RESISTANT Resistant >=8 RESISTANT Resistant GENTAMICIN <=0.5 SENSI... Sensitive <=0.5 SENSI... Sensitive Inducible Clindamycin NEGATIVE Sensitive NEGATIVE Sensitive OXACILLIN >=4 RESISTANT Resistant 2 SENSITIVE Sensitive RIFAMPIN <=0.5 SENSI... Sensitive <=0.5 SENSI... Sensitive TETRACYCLINE <=1 SENSITIVE Sensitive <=1 SENSITIVE Sensitive TRIMETH/SULFA <=10 SENSIT Sensitive <=10 SENSIT Sensitive ... Marland Kitchen.. VANCOMYCIN 1 SENSITIVE Sensitive <=0.5 SENSI... Sensitive Right foot bone . Component 3 wk ago Specimen Description BONE Special Requests RIGHT 4 METATARSAL SAMPLE B Gram Stain NO WBC SEEN NO ORGANISMS SEEN Culture RARE METHICILLIN RESISTANT STAPHYLOCOCCUS AUREUS NO ANAEROBES ISOLATED Performed at [facility], [place] [address], [place], [place] [address] Report Status 10/08/2019 FINAL Organism ID, Bacteria METHICILLIN RESISTANT STAPHYLOCOCCUS AUREUS Resulting Agency [facility] Susceptibility Methicillin resistant staphylococcus aureus MIC CIPROFLOXACIN >=8 RESISTANT Resistant CLINDAMYCIN <=0.25 SENS... Sensitive ERYTHROMYCIN >=8 RESISTANT Resistant GENTAMICIN <=0.5 SENSI... Sensitive Inducible Clindamycin NEGATIVE Sensitive OXACILLIN >=4 RESISTANT Resistant RIFAMPIN <=0.5 SENSI... Sensitive TETRACYCLINE <=1 SENSITIVE Sensitive TRIMETH/SULFA <=10 SENSIT Sensitive ... VANCOMYCIN 1 SENSITIVE Sensitive This is a patient we had in clinic earlier this year with a wound over his left fifth metatarsal head. 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 Doran Durand on 05/16/2019. He seems to have developed problems with wounds on his bilateral feet in June 2021 although he may have had problems earlier than this. He was in an urgent care with a right foot ulcer on 09/26/2019 and given a course of doxycycline. This was apparently after having trouble getting into see orthopedics. He was seen by podiatry on 09/28/2019 noted to have bilateral lower extremity ulcers including the left lateral fifth metatarsal base and the right subfifth met head. It was noted that had purulent drainage at that time. He required hospitalization from 6/20 through 7/2. This was because of worsening right foot wounds. He underwent bilateral operative incision and drainage and bone biopsies bilaterally. Culture results are listed above. He has been referred back to clinic by Dr. Jacqualyn Posey of podiatry. He is also followed by Dr. Megan Salon who saw him yesterday. He was discharged from hospital on Zyvox Flagyl and Levaquin and yesterday changed to doxycycline Flagyl and Levaquin. His inflammatory markers on 6/26 showed a sedimentation rate of 129 and a C-reactive protein of 5. This is improved to 14 and 1.3 respectively. This would indicate improvement. ABIs in our clinic today were 1.23 on the right and 1.20 on the left 11/01/2019 on evaluation today patient appears to be doing fairly well in regard to the wounds on his feet at this point. Fortunately  there is no signs of active infection at this time. No fevers, chills, nausea, vomiting, or diarrhea. He currently is seeing infectious disease and still under their care at this point. Subsequently he also has both wounds which she has not been using collagen on as he did not receive that in his packaging he did not call us and  let us know that. Apparently that just was missed on the order. Nonetheless we will get that straightened out today. 8/9-Patient returns for bilateral foot wounds, using Prisma with hydrogel moistened dressings, and the wounds appear stable. Patient using surgical shoes, avoiding much pressure or weightbearing as much as possible 8/16; patient has bilateral foot wounds. 1 on the right lateral foot proximally the other is on the left mid lateral foot. Both required debridement of callus and thick skin around the wounds. We have been using silver collagen 8/27; patient has bilateral lateral foot wounds. The area on the left substantially surrounded by callus and dry skin. This was removed from the wound edge. The underlying wound is small. The area on the right measured somewhat smaller today. 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 Unfortunately I did not update his antibiotics during today's visit. 9/10 I reviewed Dr. Hale Bogus last notes he felt he had completed antibiotics his inflammatory markers were reasonably well controlled. He has a small wound on the lateral left foot and a tiny area on the right which is just above closed. He is using Hydrofera Blue with border foam he has bilateral surgical shoes 9/24; 2 week f/u. doing well. right foot is closed. left foot still undermined. 10/14; right foot remains closed at the fifth met head. The area over the base of the left fifth metatarsal has a small open area but considerable undermining towards the plantar foot. Thick callus skin around this suggests an adequate pressure relief. We have talked about this. He says he is going to go back into his cam boot. I suggested a total contact cast he did not seem enamored with this suggestion 10/26; left foot base of the fifth metatarsal. Same condition as last time. He has skin over the area with an open wound however the skin is not  adherent. He went to see Dr. Earleen Newport who did an x-ray and culture of his foot I have not reviewed the x-ray but the patient was not told anything. He is on doxycycline 11/11; since the patient was last here he was in the emergency room on 10/30 he was concerned about swelling in the left foot. They did not do any cultures or x-rays. They changed his antibiotics to cephalexin. Previous culture showed group B strep. The cephalexin is appropriate as doxycycline has less than predictable coverage. Arrives in clinic today with swelling over this area under the wound. He also has a new wound on the right fifth metatarsal head 11/18; the patient has a difficult wound on the lateral aspect of the left fifth metatarsal head. The wound was almost ballotable last week I opened it slightly expecting to see purulence however there was just bleeding. I cultured this this was negative. X-ray unchanged. We are trying to get an MRI but I am not sure were going to be able to get this through his insurance. He also has an area on the right lateral fifth metatarsal head this looks healthier 12/3; the patient finally got our MRI. Surprisingly this did not show osteomyelitis. I did show the  soft tissue ulceration at the lateral plantar aspect of the fifth metatarsal base with a tiny residual 6 mm abscess overlying the superficial fascia I have tried to culture this area I have not been able to get this to grow anything. Nevertheless the protruding tissue looks aggravated. I suspect we should try to treat the underlying "abscess with broad-spectrum antibiotics. I am going to start him on Levaquin and Flagyl. He has much less edema in his legs and I am going to continue to wrap his legs and see him weekly 12/10. I started Levaquin and Flagyl on him last week. He just picked up the Flagyl apparently there was some delay. The worry is the wound on the left fifth metatarsal base which is substantial and worsening. His foot looks  like he inverts at the ankle making this a weightbearing surface. Certainly no improvement in fact I think the measurements of this are somewhat worse. We have been using 12/17; he apparently just got the Levaquin yesterday this is 2 weeks after the fact. He has completed the Flagyl. The area over the left fifth metatarsal base still has protruding granulation tissue although it does not look quite as bad as it did some weeks ago. He has severe bilateral lymphedema although we have not been treating him for wounds on his legs this is definitely going to require compression. There was so much edema in the left I did not wish to put him in a total contact cast today. I am going to increase his compression from 3-4 layer. The area on the right lateral fifth met head actually look quite good and superficial. 12/23; patient arrived with callus on the right fifth met head and the substantial hyper granulated callused wound on the base of his fifth metatarsal. He says he is completing his Levaquin in 2 days but I do not think that adds up with what I gave him but I will have to double check this. We are using Hydrofera Blue on both areas. My plan is to put the left leg in a cast the week after New Year's 04/06/2020; patient's wounds about the same. Right lateral fifth metatarsal head and left lateral foot over the base of the fifth metatarsal. There is undermining on the left lateral foot which I removed before application of total contact cast continuing with Hydrofera Blue new. Patient tells me he was seen by endocrinology today lab work was done [Dr. Kerr]. Also wondering whether he was referred to cardiology. I went over some lab work from previously does not have chronic renal failure certainly not nephrotic range proteinuria he does have very poorly controlled diabetes but this is not his most updated lab work. Hemoglobin A1c has been over 11 1/10; the patient had a considerable amount of leakage towards  mid part of his left foot with macerated skin however the wound surface looks better the area on the right lateral fifth met head is better as well. I am going to change the dressing on the left foot under the total contact cast to silver alginate, continue with Hydrofera Blue on the right. 1/20; patient was in the total contact cast for 10 days. Considerable amount of drainage although the skin around the wound does not look too bad on the left foot. The area on the right fifth metatarsal head is closed. Our nursing staff reports large amount of drainage out of the left lateral foot wound 1/25; continues with copious amounts of drainage described by our intake staff.  PCR culture I did last week showed E. coli and Enterococcus faecalis and low quantities. Multiple resistance genes documented including extended spectrum beta lactamase, MRSA, MRSE, quinolone, tetracycline. The wound is not quite as good this week as it was 5 days ago but about the same size 2/3; continues with copious amounts of malodorous drainage per our intake nurse. The PCR culture I did 2 weeks ago showed E. coli and low quantities of Enterococcus. There were multiple resistance genes detected. I put Neosporin on him last week although this does not seem to have helped. The wound is slightly deeper today. Offloading continues to be an issue here although with the amount of drainage she has a total contact cast is just not going to work 2/10; moderate amount of drainage. Patient reports he cannot get his stocking on over the dressing. I told him we have to do that the nurse gave him suggestions on how to make this work. The wound is on the bottom and lateral part of his left foot. Is cultured predominantly grew low amounts of Enterococcus, E. coli and anaerobes. There were multiple resistance genes detected including extended spectrum beta lactamase, quinolone, tetracycline. I could not think of an easy oral combination to address  this so for now I am going to do topical antibiotics provided by [facility] I think the main agents here are vancomycin and an aminoglycoside. We have to be able to give him access to the wounds to get the topical antibiotic on 2/17; moderate amount of drainage this is unchanged. He has his Keystone topical antibiotic against the deep tissue culture organisms. He has been using this and changing the dressing daily. Silver alginate on the wound surface. 2/24; using Keystone antibiotic with silver alginate on the top. He had too much drainage for a total contact cast at one point although I think that is improving and I think in the next week or 2 it might be possible to replace a total contact cast I did not do this today. In general the wound surface looks healthy however he continues to have thick rims of skin and subcutaneous tissue around the wide area of the circumference which I debrided 06/04/2020 upon evaluation today patient appears to be doing well in regard to his wound. I do feel like he is showing signs of improvement. There is little bit of callus and dead tissue around the edges of the wound as well as what appears to be a little bit of a sinus tract that is off to the side laterally I would perform debridement to clear that away today. Objective Constitutional Well-nourished and well-hydrated in no acute distress. Vitals Time Taken: 3:33 PM, Height: 77 in, Weight: 280 lbs, BMI: 33.2, Temperature: 97.8 F, Pulse: 97 bpm, Respiratory Rate: 18 breaths/min, Blood Pressure: 114/77 mmHg, Capillary Blood Glucose: 130 mg/dl. Respiratory normal breathing without difficulty. Psychiatric this patient is able to make decisions and demonstrates good insight into disease process. Alert and Oriented x 3. pleasant and cooperative. General Notes: Patient's wound bed actually showed signs of pretty good granulation at this time. There does not appear to be any evidence of active infection which is  great news and overall very pleased in that regard. Fortunately I think that he does seem to be making some progress here although it is slow again we discussed the possibility of a total contact cast in the future. With that being said I think that this is still not quite at the point of reinitiating that  yet we want to get some of the drainage under better control. Integumentary (Hair, Skin) Wound #3 status is Open. Original cause of wound was Trauma. The date acquired was: 10/02/2019. The wound has been in treatment 32 weeks. The wound is located on the Left,Lateral Foot. The wound measures 3cm length x 2.5cm width x 0.6cm depth; 5.89cm^2 area and 3.534cm^3 volume. There is Fat Layer (Subcutaneous Tissue) exposed. There is undermining starting at 5:00 and ending at 9:00 with a maximum distance of 0.6cm. There is a medium amount of serosanguineous drainage noted. The wound margin is thickened. There is large (67-100%) red, friable granulation within the wound bed. There is a small (1-33%) amount of necrotic tissue within the wound bed including Adherent Slough. 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 There was a Excisional Skin/Subcutaneous Tissue Debridement with a total area of 12 sq cm performed by Worthy Keeler, PA. With the following instrument(s): Curette, Forceps, and Scissors to remove Viable and Non-Viable tissue/material. Material removed includes Callus, Subcutaneous Tissue, Slough, Skin: Dermis, Skin: Epidermis, and Fibrin/Exudate after achieving pain control using Lidocaine 4% T opical Solution. A time out was conducted at 16:35, prior to the start of the procedure. A  Minimum amount of bleeding was controlled with Pressure. The procedure was tolerated well with a pain level of 0 throughout and a pain level of 0 following the procedure. Post Debridement Measurements: 3cm length x 2.5cm width x 0.6cm depth; 3.534cm^3 volume. Character of Wound/Ulcer Post Debridement is improved. Severity of Tissue Post Debridement is: Fat layer exposed. Post procedure Diagnosis Wound #3: Same as Pre-Procedure Plan Follow-up Appointments: Return Appointment in 2 weeks. Bathing/ Shower/ Hygiene: May shower and wash wound with soap and water. Edema Control - Lymphedema / SCD / Other: Elevate legs to the level of the heart or above for 30 minutes daily and/or when sitting, a frequency of: - throughout the day Avoid standing for long periods of time. Exercise regularly Moisturize legs daily. - both legs every night before bed. Compression stocking or Garment 20-30 mm/Hg pressure to: - Apply to both legs in the morning and remove at night. WOUND #3: - Foot Wound Laterality: Left, Lateral Cleanser: Normal Saline (Generic) 1 x Per Day/30 Days Discharge Instructions: Cleanse the wound with Normal Saline prior to applying a clean dressing using gauze sponges, not tissue or cotton balls. Cleanser: Soap and Water 1 x Per Day/30 Days Discharge Instructions: May shower and wash wound with dial antibacterial soap and water prior to dressing change. Peri-Wound Care: Zinc Oxide Ointment 30g tube 1 x Per Day/30 Days Discharge Instructions: Apply Zinc Oxide to periwound with each dressing change as needed. Peri-Wound Care: Sween Lotion (Moisturizing lotion) 1 x Per Day/30 Days Discharge Instructions: Apply moisturizing lotion as directed Topical: keystone antibiotics 1 x Per Day/30 Days Discharge Instructions: apply topically daily. Prim Dressing: KerraCel Ag Gelling Fiber Dressing, 4x5 in (silver alginate) 1 x Per Day/30 Days ary Discharge Instructions: Apply silver alginate over the  keystone antibiotics to wound bed as instructed Secondary Dressing: ABD Pad, 8x10 1 x Per Day/30 Days Discharge Instructions: T down before applying kerlix. Apply over primary dressing as directed. ape Secured With: [employer], 4.5x3.1 (in/yd) (Generic) 1 x Per  Day/30 Days Discharge Instructions: Secure with Kerlix as directed. Secured With: 11M Medipore H Soft Cloth Surgical T 4 x 2 (in/yd) (Generic) 1 x Per Day/30 Days ape Discharge Instructions: Secure dressing with tape as directed. Add-Ons: felt 1 x Per Day/30 Days Discharge Instructions: apply felt in shoe. 1. Would recommend currently that we going continue with the wound care measures as before and the patient is in agreement the plan that includes the use of the silver alginate dressing we will use the [facility] antibiotic gel on top of the wound bed for application of the alginate. 2. Also can recommend that he continue with ABD pad roll gauze and secure. 3. I am also going to recommend the patient continue with using the offloading techniques mentioned above in order to try to keep pressure off of the foot is much as possible he should limit his walking to only what he absolutely has to do. We will see patient back for reevaluation in 2 weeks here in the clinic. If anything worsens or changes patient will contact our office for additional recommendations. Electronic Signature(s) Signed: 06/04/2020 4:54:49 PM By: Worthy Keeler PA-C Previous Signature: 06/04/2020 4:54:31 PM Version By: Worthy Keeler PA-C Entered By: Worthy Keeler on 06/04/2020 16:54:48 -------------------------------------------------------------------------------- SuperBill Details Patient Name: Date of Service: [name], [name] 06/04/2020 Medical Record Number: [id number] Patient Account Number: [id number] Date of Birth/Sex: Treating RN: Aug 15, 1985 (35 y.o. [name] Primary Care Provider: [name] Other Clinician: Referring  Provider: Treating Provider/Extender: Jennye Moccasin in Treatment: 32 Diagnosis Coding ICD-10 Codes Code Description E11.621 Type 2 diabetes mellitus with foot ulcer L97.528 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ity Facility Procedures CPT4 Code: [id number] Description: 43888 - DEB SUBQ TISSUE 20 SQ CM/< ICD-10 Diagnosis Description L97.528 Non-pressure chronic ulcer of other part of left foot with other specified seve Modifier: rity Quantity: 1 Physician Procedures : CPT4 Code Description Modifier [id number] - WC PHYS SUBQ TISS 20 SQ CM ICD-10 Diagnosis Description L97.528 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ity Quantity: 1 Electronic Signature(s) Signed: 06/04/2020 4:54:55 PM By: Worthy Keeler PA-C Entered By: Worthy Keeler on 06/04/2020 16:54:54

## 2020-06-04 NOTE — Progress Notes (Signed)
[name], [name] ([id number]) Visit Report for 06/04/2020 Arrival Information Details Patient Name: Date of Service: [name] 06/04/2020 3:00 PM Medical Record Number: [id number] Patient Account Number: [id number] Date of Birth/Sex: Treating RN: June 29, 1985 (35 y.o. [name], [name] Primary Care [name]: [name] Other Clinician: Referring [name]: Treating [name]/Extender: [name] in Treatment: 43 Visit Information History Since Last Visit Added or deleted any medications: No Patient Arrived: Ambulatory Any new allergies or adverse reactions: No Arrival Time: 15:31 Had a fall or experienced change in No Accompanied By: self activities of daily living that may affect Transfer Assistance: None risk of falls: Patient Identification Verified: Yes Signs or symptoms of abuse/neglect since last visito No Secondary Verification Process Completed: Yes Hospitalized since last visit: No Patient Requires Transmission-Based Precautions: No Implantable device outside of the clinic excluding No Patient Has Alerts: No cellular tissue based products placed in the center since last visit: Has Dressing in Place as Prescribed: Yes Pain Present Now: No Electronic Signature(s) Signed: 06/04/2020 5:30:41 PM By: [name] Entered By: [name] on 06/04/2020 15:33:23 -------------------------------------------------------------------------------- Encounter Discharge Information Details Patient Name: Date of Service: [name], [name] 06/04/2020 3:00 PM Medical Record Number: [id number] Patient Account Number: [id number] Date of Birth/Sex: Treating RN: Mar 24, 1986 (35 y.o. [name] Primary Care [name]: [name] Other Clinician: Referring [name]: Treating [name]/Extender: [name] in Treatment: [phone] Encounter Discharge Information Items Post Procedure Vitals Discharge Condition: Stable Temperature  (F): 97.8 Ambulatory Status: Ambulatory Pulse (bpm): 97 Discharge Destination: Home Respiratory Rate (breaths/min): 18 Transportation: Private Auto Blood Pressure (mmHg): 114/77 Schedule Follow-up Appointment: Yes Clinical Summary of Care: Provided on 06/04/2020 Form Type Recipient Paper Patient Patient Electronic Signature(s) Signed: 06/04/2020 5:10:26 PM By: [name] Entered By: [name] on 06/04/2020 17:10:26 -------------------------------------------------------------------------------- Lower Extremity Assessment Details Patient Name: Date of Service: [name], [name] 06/04/2020 3:00 PM Medical Record Number: [id number] Patient Account Number: [id number] Date of Birth/Sex: Treating RN: Dec 20, 1985 (35 y.o. [name] Primary Care Bronte [name]: [name] Other Clinician: Referring Shabana Armentrout: Treating Karyn Brull/Extender: [name] in Treatment: 32 Edema Assessment Assessed: [Left: No] [Right: No] Edema: [Left: Ye] [Right: s] Calf Left: Right: Point of Measurement: 48 cm From Medial Instep 47.5 cm Ankle Left: Right: Point of Measurement: 10 cm From Medial Instep 30.2 cm Vascular Assessment Pulses: Dorsalis Pedis Palpable: [Left:Yes] Electronic Signature(s) Signed: 06/04/2020 6:02:48 PM By: Max Gouty RN, [name] Entered By: [name] on 06/04/2020 15:50:40 -------------------------------------------------------------------------------- Multi-Disciplinary Care Plan Details Patient Name: Date of Service: [name], [name] 06/04/2020 3:00 PM Medical Record Number: [id number] Patient Account Number: [id number] Date of Birth/Sex: Treating RN: [birth year] (35 y.o. [name] Primary Care [name]: [name] Other Clinician: Referring [name]: Treating [name]/Extender: [name] in Treatment: [name] reviewed with physician Active Inactive Nutrition Nursing  Diagnoses: Imbalanced nutrition Potential for alteratiion in Nutrition/Potential for imbalanced nutrition Goals: Patient/caregiver agrees to and verbalizes understanding of need to use nutritional supplements and/or vitamins as prescribed Date Initiated: 10/24/2019 Date Inactivated: 04/06/2020 Target Resolution Date: 04/03/2020 Goal Status: Met Patient/caregiver will maintain therapeutic glucose control Date Initiated: 10/24/2019 Target Resolution Date: 06/05/2020 Goal Status: Active Interventions: Assess HgA1c results as ordered upon admission and as needed Assess patient nutrition upon admission and as needed per policy Provide education on elevated blood sugars and impact on wound healing Provide education on nutrition Treatment Activities: Education provided on Nutrition :  04/23/2020 Notes: Wound/Skin Impairment Nursing Diagnoses: Impaired tissue integrity Knowledge deficit related to ulceration/compromised skin integrity Goals: Patient/caregiver will verbalize understanding of skin care regimen Date Initiated: 10/24/2019 Target Resolution Date: 06/06/2020 Goal Status: Active Ulcer/skin breakdown will have a volume reduction of 30% by week 4 Date Initiated: 10/24/2019 Date Inactivated: 01/16/2020 Target Resolution Date: 01/10/2020 Unmet Reason: no change in Goal Status: Unmet measurements. Interventions: Assess patient/caregiver ability to obtain necessary supplies Assess patient/caregiver ability to perform ulcer/skin care regimen upon admission and as needed Assess ulceration(s) every visit Provide education on ulcer and skin care Notes: Electronic Signature(s) Signed: 06/04/2020 5:49:32 PM By: [name] Entered By: [name] on 06/04/2020 16:50:51 -------------------------------------------------------------------------------- Pain Assessment Details Patient Name: Date of Service: [name] 06/04/2020 3:00 PM Medical Record Number: [id number] Patient Account  Number: [id number] Date of Birth/Sex: Treating RN: [birth year] (35 y.o. [name] Primary Care [name]: [name] Other Clinician: Referring [name]: Treating [name]/Extender: [name] in Treatment: 32 Active Problems Location of Pain Severity and Description of Pain Patient Has Paino No Site Locations Pain Management and Medication Current Pain Management: Electronic Signature(s) Signed: 06/04/2020 5:30:41 PM By: [name] Signed: 06/04/2020 5:49:32 PM By: [name] Entered By: [name] on 06/04/2020 15:33:54 -------------------------------------------------------------------------------- Patient/Caregiver Education Details Patient Name: Date of Service: [name] 3/3/2022andnbsp3:00 PM Medical Record Number: [id number] Patient Account Number: [id number] Date of Birth/Gender: Treating RN: September 04, 1985 (35 y.o. [name] Primary Care Physician: [name] Other Clinician: Referring Physician: Treating Physician/Extender: [name] in Treatment: 67 Education Assessment Education Provided To: Patient Education Topics Provided Wound/Skin Impairment: Handouts: Skin Care Do's and Dont's Methods: Explain/Verbal Responses: Reinforcements needed Electronic Signature(s) Signed: 06/04/2020 5:49:32 PM By: [name] Entered By: [name] on 06/04/2020 16:51:08 -------------------------------------------------------------------------------- Wound Assessment Details Patient Name: Date of Service: [name] 06/04/2020 3:00 PM Medical Record Number: [id number] Patient Account Number: [id number] Date of Birth/Sex: Treating RN: 1985/06/13 (35 y.o. [name] Primary Care Debie Ashline: [name] Other Clinician: Referring Rojelio Uhrich: Treating Garritt Molyneux/Extender: [name] in Treatment: 32 Wound Status Wound Number: 3 Primary Etiology:  Diabetic Wound/Ulcer of the Lower Extremity Wound Location: Left, Lateral Foot Wound Status: Open Wounding Event: Trauma Comorbid History: Type II Diabetes Date Acquired: 10/02/2019 Weeks Of Treatment: 32 Clustered Wound: No Photos Wound Measurements Length: (cm) 3 Width: (cm) 2.5 Depth: (cm) 0.6 Area: (cm) 5.89 Volume: (cm) 3.534 % Reduction in Area: -257.2% % Reduction in Volume: -2041.8% Epithelialization: Small (1-33%) Undermining: Yes Starting Position (o'clock): 5 Ending Position (o'clock): 9 Maximum Distance: (cm) 0.6 Wound Description Classification: Grade 2 Wound Margin: Thickened Exudate Amount: Medium Exudate Type: Serosanguineous Exudate Color: red, brown Foul Odor After Cleansing: No Slough/Fibrino Yes Wound Bed Granulation Amount: Large (67-100%) Exposed Structure Granulation Quality: Red, Friable Fascia Exposed: No Necrotic Amount: Small (1-33%) Fat Layer (Subcutaneous Tissue) Exposed: Yes Necrotic Quality: Adherent Slough Tendon Exposed: No Muscle Exposed: No Joint Exposed: No Bone Exposed: No Treatment Notes Wound #3 (Foot) Wound Laterality: Left, Lateral Cleanser Normal Saline Discharge Instruction: Cleanse the wound with Normal Saline prior to applying a clean dressing using gauze sponges, not tissue or cotton balls. Soap and Water Discharge Instruction: May shower and wash wound with dial antibacterial soap and water prior to dressing change. Peri-Wound Care Zinc Oxide Ointment 30g tube Discharge Instruction: Apply Zinc Oxide to periwound with each dressing change as needed. Sween Lotion (Moisturizing lotion) Discharge Instruction: Apply moisturizing  lotion as directed Topical keystone antibiotics Discharge Instruction: apply topically daily. Primary Dressing KerraCel Ag Gelling Fiber Dressing, 4x5 in (silver alginate) Discharge Instruction: Apply silver alginate over the keystone antibiotics to wound bed as instructed Secondary  Dressing ABD Pad, 8x10 Discharge Instruction: T down before applying kerlix. Apply over primary dressing as directed. ape Secured With [employer], 4.5x3.1 (in/yd) Discharge Instruction: Secure with Kerlix as directed. 20M Medipore H Soft Cloth Surgical T 4 x 2 (in/yd) ape Discharge Instruction: Secure dressing with tape as directed. Compression Wrap Compression Stockings Add-Ons felt Discharge Instruction: apply felt in shoe. Electronic Signature(s) Signed: 06/04/2020 5:30:41 PM By: [name] Signed: 06/04/2020 5:49:32 PM By: [name] Entered By: [name] on 06/04/2020 17:28:52 -------------------------------------------------------------------------------- Vitals Details Patient Name: Date of Service: [name], [name] 06/04/2020 3:00 PM Medical Record Number: [id number] Patient Account Number: [id number] Date of Birth/Sex: Treating RN: 02-Sep-1985 (35 y.o. [name] Primary Care Teffany Blaszczyk: [name] Other Clinician: Referring Amariz Flamenco: Treating Leonardo [name]/Extender: [name] in Treatment: 32 Vital Signs Time Taken: 15:33 Temperature (F): 97.8 Height (in): 77 Pulse (bpm): 97 Weight (lbs): 280 Respiratory Rate (breaths/min): 18 Body Mass Index (BMI): 33.2 Blood Pressure (mmHg): 114/77 Capillary Blood Glucose (mg/dl): 130 Reference Range: 80 - 120 mg / dl Electronic Signature(s) Signed: 06/04/2020 5:30:41 PM By: [name] Entered By: [name] on 06/04/2020 15:33:47

## 2020-06-18 ENCOUNTER — Other Ambulatory Visit: Payer: Self-pay

## 2020-06-18 ENCOUNTER — Encounter (HOSPITAL_BASED_OUTPATIENT_CLINIC_OR_DEPARTMENT_OTHER): Payer: BC Managed Care – PPO | Admitting: Internal Medicine

## 2020-06-18 DIAGNOSIS — E11621 Type 2 diabetes mellitus with foot ulcer: Secondary | ICD-10-CM | POA: Diagnosis not present

## 2020-06-18 NOTE — Progress Notes (Signed)
[name], Max ([id number]) Visit Report for 06/18/2020 HPI Details Patient Name: Date of Service: [name] 06/18/2020 3:15 PM Medical Record Number: [id number] Patient Account Number: [id number] Date of Birth/Sex: Treating RN: 12-18-1985 (35 y.o. 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 He comes in for review of a wound over the plantar fifth metatarsal head extending into the lateral part of the foot. He was followed for this previously by his podiatrist Dr. Cornelius Moras. As the patient tells his story he went to see podiatry first for a swelling he developed on the lateral part of his fifth metatarsal head in May. He states this was "open" by podiatry and the area closed. He was followed up in June and it was again opened callus removed and it closed promptly. There were plans being made for surgery on the fifth metatarsal head in June however his blood sugar was apparently too high for anesthesia. Apparently the area was debrided and opened again in June and it is never closed since. Looking over the records from podiatry I am really not able to follow this. It was clear when he was first seen it was before 5/14 at that point he already had a wound. By 5/17 the ulcer was resolved. I do not see anything about a procedure. On 5/28 noted to have pre-ulcerative moderate keratosis. X-ray noted 1/5 contracted toe and tailor's bunion and metatarsal deformity. On a visit date on 09/28/2018 the dorsal part of the left foot it healed and resolved. There was concern about swelling in his lower extremity he was sent to the ER.. As far as I can tell he was seen in the ER on 7/12 with an ulcer on his left foot. A DVT rule out  of the left leg was negative. I do not think I have complete records from podiatry but I am not able to verify the procedures this patient states he had. He states after the last procedure the wound has never closed although I am not able to follow this in the records I have from podiatry. He has not had a recent x-ray The patient has been using Neosporin on the wound. He is wearing a Darco shoe. He is still very active up on his foot working and exercising. Past medical history; type 2 diabetes ketosis-prone, leg swelling with a negative DVT study in July. Non-smoker ABI in our clinic was 0.85 on the left 10/16; substantial wound on the plantar left fifth met head extending laterally almost to the dorsal fifth MTP. We have been using silver alginate we gave him a Darco forefoot off loader. An x-ray did not show evidence of osteomyelitis did note soft tissue emphysema which I think was due to gas tracking through an open wound. There is no doubt in my mind he requires an MRI 10/23; MRI not booked until 3 November at the earliest this is largely due to his glucose sensor in the right arm. We have been using silver alginate. There has been an improvement 10/29; I am still not exactly sure when his MRI is booked for. He says it is the third but it is the 10th in epic. This definitely needs to be done. He is running a low-grade fever today but no other symptoms. No real improvement  in the 1 02/26/2019 patient presents today for a follow-up visit here in our clinic he is last been seen in the clinic on October 29. Subsequently we were working on getting MRI to evaluate and see what exactly was going on and where we would need to go from the standpoint of whether or not he had osteomyelitis and again what treatments were going be required. Subsequently the patient ended up being admitted to the hospital on 02/07/2019 and was discharged on 02/14/2019. This is a somewhat interesting admission with a discharge  diagnosis of pneumonia due to COVID-19 although he was positive for COVID-19 when tested at the urgent care but negative x2 when he was actually in the hospital. With that being said he did have acute respiratory failure with hypoxia and it was noted he also have a left foot ulceration with osteomyelitis. With that being said he did require oxygen for his pneumonia and I level 4 L. He was placed on antivirals and steroids for the COVID-19. He was also transferred to the [facility] at one point. Nonetheless he did subsequently discharged home and since being home has done much better in that regard. The CT angiogram did not show any pulmonary embolism. With regard to the osteomyelitis the patient was placed on vancomycin and Zosyn while in the hospital but has been changed to Augmentin at discharge. It was also recommended that he follow- up with wound care and podiatry. Podiatry however wanted him to see [name] according to the patient prior to them doing anything further. His hemoglobin A1c was 9.9 as noted in the hospital. Have an MRI of the left foot performed while in the hospital on 02/04/2019. 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 There is an overlying plantar open wound noted an abscess tracking back along the lateral aspect of the fifth metatarsal shaft. There is otherwise diffuse cellulitis and mild fasciitis without findings of polymyositis. The patient did have recently pneumonia secondary to COVID-19 I looked in the chart through epic and it does appear that the patient may need to have an additional x-ray just to ensure everything is cleared and that he has no airspace disease prior to putting him into the [name]. 03/05/2019; patient was readmitted to the clinic last week. He was hospitalized twice for a viral upper respiratory tract infection from 11/1 through 11/4 and then 11/5  through 11/12 ultimately this turned out to be Covid pneumonitis. Although he was discharged on oxygen he is not using it. He says he feels fine. He has no exercise limitation no cough no sputum. His O2 sat in our clinic today was 100% on room air. 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 He received Vanco and Zosyn in the hospital and then was discharged on 2 weeks of Augmentin. I do not see any relevant cultures. He was supposed to follow-up with infectious disease but I do not see that he has an appointment. 12/8; patient saw Dr. Novella Olive of infectious disease last week. He felt that he had had adequate antibiotic therapy. He did not go to follow-up with Dr. Amalia Hailey of podiatry and I have again talked to him about the pros and cons of this. He does not want to consider a ray amputation of this time. He is aware of the risks of  recurrence, migration etc. He started HBO today and tolerated this well. He can complete the Augmentin that I gave him last week. I have looked over the lab work that Dr. Chana Bode ordered his C-reactive protein was 3.3 and his sedimentation rate was 17. The C-reactive protein is never really been measurably that high in this patient 12/15; not much change in the wound today however he has undermining along the lateral part of the foot again more extensively than last week. He has some rims of epithelialization. We have been using silver alginate. He is undergoing hyperbarics but did not dive today 12/18; in for his obligatory first total contact cast change. Unfortunately there was pus coming from the undermining area around his fifth metatarsal head. This was cultured but will preclude reapplication of a cast. He is seen in conjunction with HBO 12/24; patient had staph lugdunensis in the wound in the undermining area laterally last time. We put him on doxycycline  which should have covered this. The wound looks better today. I am going to give him another week of doxycycline before reattempting the total contact cast 12/31; the patient is completing antibiotics. Hemorrhagic debris in the distal part of the wound with some undermining distally. He also had hyper granulation. Extensive debridement with a #5 curette. The infected area that was on the lateral part of the fifth met head is closed over. I do not think he needs any more antibiotics. Patient was seen prior to HBO. Preparations for a total contact cast were made in the cast will be placed post hyperbarics 04/11/19; once again the patient arrives today without complaint. He had been in a cast all week noted that he had heavy drainage this week. This resulted in large raised areas of macerated tissue around the wound 1/14; wound bed looks better slightly smaller. Hydrofera Blue has been changing himself. He had a heavy drainage last week which caused a lot of maceration around the wound so I took him out of a total contact cast he says the drainage is actually better this week He is seen today in conjunction with HBO 1/21; returns to clinic. He was up in [place] for a day or 2 attending a funeral. He comes back in with the wound larger and with a large area of exposed bone. He had osteomyelitis and septic arthritis of the fifth left metatarsal head while he was in hospital. He received IV antibiotics in the hospital for a prolonged period of time then 3 weeks of Augmentin. Subsequently I gave him 2 weeks of doxycycline for more superficial wound infection. When I saw this last week the wound was smaller the surface of the wound looks satisfactory. 1/28; patient missed hyperbarics today. Bone biopsy I did last time showed Enterococcus faecalis and Staphylococcus lugdunensis . He has a wide area of exposed bone. We are going to use silver alginate as of today. I had another ethical discussion with the  patient. 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 In this situation I think the likelihood of healing this is low. Therefore I have recommended a ray amputation and with the patient's agreement I have referred him to Dr. Doran Durand. The other issue is that his compliance with hyperbarics has been minimal because of his work schedule and given his underlying decision I am going to stop this today READMISSION 10/24/2019 MRI 09/29/2019 left foot IMPRESSION: 1. Apparent skin ulceration inferior and lateral to the 5th metatarsal base with underlying heterogeneous T2 signal  and enhancement in the subcutaneous fat. Small peripherally enhancing fluid collections along the plantar and lateral aspects of the 5th metatarsal base suspicious for abscesses. 2. Interval amputation through the mid 5th metatarsal with nonspecific low-level marrow edema and enhancement. Given the proximity to the adjacent soft tissue inflammatory changes, osteomyelitis cannot be excluded. 3. The additional bones appear unremarkable. MRI 09/29/2019 right foot IMPRESSION: 1. Soft tissue ulceration lateral to the 5th MTP joint. There is low-level T2 hyperintensity within the 4th and 5th metatarsal heads and adjacent proximal phalanges without abnormal T1 signal or cortical destruction. These findings are nonspecific and could be seen with early marrow edema, hyperemia or early osteomyelitis. No evidence of septic joint. 2. Mild tenosynovitis and synovial enhancement associated with the extensor digitorum tendons at the level of the midfoot. 3. Diffuse low-level muscular T2 hyperintensity and enhancement, most consistent with diabetic myopathy. LEFT FOOT BONE Methicillin resistant staphylococcus aureus Staphylococcus lugdunensis MIC MIC CIPROFLOXACIN >=8 RESISTANT Resistant <=0.5 SENSI... Sensitive CLINDAMYCIN <=0.25 SENS... Sensitive >=8 RESISTANT Resistant ERYTHROMYCIN >=8 RESISTANT  Resistant >=8 RESISTANT Resistant GENTAMICIN <=0.5 SENSI... Sensitive <=0.5 SENSI... Sensitive Inducible Clindamycin NEGATIVE Sensitive NEGATIVE Sensitive OXACILLIN >=4 RESISTANT Resistant 2 SENSITIVE Sensitive RIFAMPIN <=0.5 SENSI... Sensitive <=0.5 SENSI... Sensitive TETRACYCLINE <=1 SENSITIVE Sensitive <=1 SENSITIVE Sensitive TRIMETH/SULFA <=10 SENSIT Sensitive <=10 SENSIT Sensitive ... Marland Kitchen.. VANCOMYCIN 1 SENSITIVE Sensitive <=0.5 SENSI... Sensitive Right foot bone . Component 3 wk ago Specimen Description BONE Special Requests RIGHT 4 METATARSAL SAMPLE B Gram Stain NO WBC SEEN NO ORGANISMS SEEN Culture RARE METHICILLIN RESISTANT STAPHYLOCOCCUS AUREUS NO ANAEROBES ISOLATED Performed at [facility], [place] [address], [place],  [address] Report Status 10/08/2019 FINAL Organism ID, Bacteria METHICILLIN RESISTANT STAPHYLOCOCCUS AUREUS Resulting Agency [facility] Susceptibility Methicillin resistant staphylococcus aureus MIC CIPROFLOXACIN >=8 RESISTANT Resistant CLINDAMYCIN <=0.25 SENS... Sensitive ERYTHROMYCIN >=8 RESISTANT Resistant GENTAMICIN <=0.5 SENSI... Sensitive Inducible Clindamycin NEGATIVE Sensitive OXACILLIN >=4 RESISTANT Resistant RIFAMPIN <=0.5 SENSI... Sensitive TETRACYCLINE <=1 SENSITIVE Sensitive TRIMETH/SULFA <=10 SENSIT Sensitive ... VANCOMYCIN 1 SENSITIVE Sensitive This is a patient we had in clinic earlier this year with a wound over his left fifth metatarsal head. 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 Doran Durand on 05/16/2019. He seems to have developed problems with wounds on his bilateral feet in June 2021 although he may have had problems earlier than this. He was  in an urgent care with a right foot ulcer on 09/26/2019 and given a course of doxycycline. This was apparently after having trouble getting into see orthopedics. He was seen by podiatry on 09/28/2019 noted to have bilateral lower extremity ulcers including the left lateral fifth metatarsal base and the right subfifth met head. It was noted that had purulent drainage at that time. He required hospitalization from 6/20 through 7/2. This was because of worsening right foot wounds. He underwent bilateral operative incision and drainage and bone biopsies bilaterally. Culture results are listed above. He has been referred back to clinic by Dr. Jacqualyn Posey of podiatry. He is also followed by Dr. Megan Salon who saw him yesterday. He was discharged from hospital on Zyvox Flagyl and Levaquin and yesterday changed to doxycycline Flagyl and Levaquin. His inflammatory markers on 6/26 showed a sedimentation rate of 129 and a C-reactive protein of 5. This is  improved to 14 and 1.3 respectively. This would indicate improvement. ABIs in our clinic today were 1.23 on the right and 1.20 on the left 11/01/2019 on evaluation today patient appears to be doing fairly well in regard to the wounds on his feet at this point. Fortunately there is no signs of active infection at this time. No fevers, chills, nausea, vomiting, or diarrhea. He currently is seeing infectious disease and still under their care at this point. Subsequently he also has both wounds which she has not been using collagen on as he did not receive that in his packaging he did not call us and let us know that. Apparently that just was missed on the order. Nonetheless we will get that straightened out today. 8/9-Patient returns for bilateral foot wounds, using Prisma with hydrogel moistened dressings, and the wounds appear stable. Patient using surgical shoes, avoiding much pressure or weightbearing as much as possible 8/16; patient has bilateral foot wounds. 1 on the  right lateral foot proximally the other is on the left mid lateral foot. Both required debridement of callus and thick skin around the wounds. We have been using silver collagen 8/27; patient has bilateral lateral foot wounds. The area on the left substantially surrounded by callus and dry skin. This was removed from the wound edge. The underlying wound is small. The area on the right measured somewhat smaller today. 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 Unfortunately I did not update his antibiotics during today's visit. 9/10 I reviewed Dr. Hale Bogus last notes he felt he had completed antibiotics his inflammatory markers were reasonably well controlled. He has a small wound on the lateral left foot and a tiny area on the right which is just above closed. He is using Hydrofera Blue with border foam he has bilateral surgical shoes 9/24; 2 week f/u. doing well. right foot is closed. left foot still undermined. 10/14; right foot remains closed at the fifth met head. The area over the base of the left fifth metatarsal has a small open area but considerable undermining towards the plantar foot. Thick callus skin around this suggests an adequate pressure relief. We have talked about this. He says he is going to go back into his cam boot. I suggested a total contact cast he did not seem enamored with this suggestion 10/26; left foot base of the fifth metatarsal. Same condition as last time. He has skin over the area with an open wound however the skin is not adherent. He went to see Dr. Earleen Newport who did an x-ray and culture of his foot I have not reviewed the x-ray but the patient was not told anything. He is on doxycycline 11/11; since the patient was last here he was in the emergency room on 10/30 he was concerned about swelling in the left foot. They did not do any cultures or x-rays. They changed his antibiotics to cephalexin.  Previous culture showed group B strep. The cephalexin is appropriate as doxycycline has less than predictable coverage. Arrives in clinic today with swelling over this area under the wound. He also has a new wound on the right fifth metatarsal head 11/18; the patient has a difficult wound on the lateral aspect of the left fifth metatarsal head. The wound was almost ballotable last week I opened it slightly expecting to see purulence however there was just bleeding. I cultured this this was negative. X-ray unchanged. We are trying to get an MRI  but I am not sure were going to be able to get this through his insurance. He also has an area on the right lateral fifth metatarsal head this looks healthier 12/3; the patient finally got our MRI. Surprisingly this did not show osteomyelitis. I did show the soft tissue ulceration at the lateral plantar aspect of the fifth metatarsal base with a tiny residual 6 mm abscess overlying the superficial fascia I have tried to culture this area I have not been able to get this to grow anything. Nevertheless the protruding tissue looks aggravated. I suspect we should try to treat the underlying "abscess with broad-spectrum antibiotics. I am going to start him on Levaquin and Flagyl. He has much less edema in his legs and I am going to continue to wrap his legs and see him weekly 12/10. I started Levaquin and Flagyl on him last week. He just picked up the Flagyl apparently there was some delay. The worry is the wound on the left fifth metatarsal base which is substantial and worsening. His foot looks like he inverts at the ankle making this a weightbearing surface. Certainly no improvement in fact I think the measurements of this are somewhat worse. We have been using 12/17; he apparently just got the Levaquin yesterday this is 2 weeks after the fact. He has completed the Flagyl. The area over the left fifth metatarsal base still has protruding granulation tissue although  it does not look quite as bad as it did some weeks ago. He has severe bilateral lymphedema although we have not been treating him for wounds on his legs this is definitely going to require compression. There was so much edema in the left I did not wish to put him in a total contact cast today. I am going to increase his compression from 3-4 layer. The area on the right lateral fifth met head actually look quite good and superficial. 12/23; patient arrived with callus on the right fifth met head and the substantial hyper granulated callused wound on the base of his fifth metatarsal. He says he is completing his Levaquin in 2 days but I do not think that adds up with what I gave him but I will have to double check this. We are using Hydrofera Blue on both areas. My plan is to put the left leg in a cast the week after New Year's 04/06/2020; patient's wounds about the same. Right lateral fifth metatarsal head and left lateral foot over the base of the fifth metatarsal. There is undermining on the left lateral foot which I removed before application of total contact cast continuing with Hydrofera Blue new. Patient tells me he was seen by endocrinology today lab work was done [Dr. Kerr]. Also wondering whether he was referred to cardiology. I went over some lab work from previously does not have chronic renal failure certainly not nephrotic range proteinuria he does have very poorly controlled diabetes but this is not his most updated lab work. Hemoglobin A1c has been over 11 1/10; the patient had a considerable amount of leakage towards mid part of his left foot with macerated skin however the wound surface looks better the area on the right lateral fifth met head is better as well. I am going to change the dressing on the left foot under the total contact cast to silver alginate, continue with Hydrofera Blue on the right. 1/20; patient was in the total contact cast for 10 days. Considerable amount of  drainage although the skin around  the wound does not look too bad on the left foot. The area on the right fifth metatarsal head is closed. Our nursing staff reports large amount of drainage out of the left lateral foot wound 1/25; continues with copious amounts of drainage described by our intake staff. PCR culture I did last week showed E. coli and Enterococcus faecalis and low quantities. Multiple resistance genes documented including extended spectrum beta lactamase, MRSA, MRSE, quinolone, tetracycline. The wound is not quite as good this week as it was 5 days ago but about the same size 2/3; continues with copious amounts of malodorous drainage per our intake nurse. The PCR culture I did 2 weeks ago showed E. coli and low quantities of Enterococcus. There were multiple resistance genes detected. I put Neosporin on him last week although this does not seem to have helped. The wound is slightly deeper today. Offloading continues to be an issue here although with the amount of drainage she has a total contact cast is just not going to work 2/10; moderate amount of drainage. Patient reports he cannot get his stocking on over the dressing. I told him we have to do that the nurse gave him suggestions on how to make this work. The wound is on the bottom and lateral part of his left foot. Is cultured predominantly grew low amounts of Enterococcus, E. coli and anaerobes. There were multiple resistance genes detected including extended spectrum beta lactamase, quinolone, tetracycline. I could not think of an easy oral combination to address this so for now I am going to do topical antibiotics provided by [facility] I think the main agents here are vancomycin and an aminoglycoside. We have to be able to give him access to the wounds to get the topical antibiotic on 2/17; moderate amount of drainage this is unchanged. He has his Keystone topical antibiotic against the deep tissue culture organisms. He has been  using this and changing the dressing daily. Silver alginate on the wound surface. 2/24; using Keystone antibiotic with silver alginate on the top. He had too much drainage for a total contact cast at one point although I think that is improving and I think in the next week or 2 it might be possible to replace a total contact cast I did not do this today. In general the wound surface looks healthy however he continues to have thick rims of skin and subcutaneous tissue around the wide area of the circumference which I debrided 06/04/2020 upon evaluation today patient appears to be doing well in regard to his wound. I do feel like he is showing signs of improvement. There is little bit of callus and dead tissue around the edges of the wound as well as what appears to be a little bit of a sinus tract that is off to the side laterally I would perform debridement to clear that away today. 3/17; left lateral foot. The wound looks about the same as I remember. Not much depth surface looks healthy. No evidence of infection Electronic Signature(s) Signed: 06/18/2020 5:46:09 PM By: [name] MD Entered By: [name] on 06/18/2020 17:30:45 -------------------------------------------------------------------------------- Physical Exam Details Patient Name: Date of Service: [name], [name] 06/18/2020 3:15 PM Medical Record Number: [id number] Patient Account Number: [id number] Date of Birth/Sex: Treating RN: September 10, 1985 (35 y.o. [name] Primary Care Provider: [name] Other Clinician: Referring Provider: Treating Provider/Extender: [name] in Treatment: 34 Constitutional Sitting or standing Blood Pressure is within target range for patient.Marland Kitchen  Pulse regular and within target range for patient.Marland Kitchen Respirations regular, non-labored and within target range.. Temperature is normal and within the target range for the patient.Marland Kitchen Appears in no  distress. Cardiovascular Pedal pulses are palpable. Notes Wound exam; some nonattached skin around the lateral aspect of the wound but most of this looks satisfactory. No major debridement. Electronic Signature(s) Signed: 06/18/2020 5:46:09 PM By: [name] MD Entered By: [name] on 06/18/2020 17:31:47 -------------------------------------------------------------------------------- Physician Orders Details Patient Name: Date of Service: [name], [name] 06/18/2020 3:15 PM Medical Record Number: [id number] Patient Account Number: [id number] Date of Birth/Sex: Treating RN: 08/19/1985 (35 y.o. 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 - Cast next week. Return A Cellular or Tissue Based Products Cellular or Tissue Based Product Type: - wound center to run insurance authorization for approval for Dermagraft. Bathing/ Shower/ Hygiene May shower and wash wound with soap and water. Edema Control - Lymphedema / SCD / Other Bilateral Lower Extremities Elevate legs to the level of the heart or above for 30 minutes daily and/or when sitting, a frequency of: - throughout the day Avoid standing for long periods of time. Exercise regularly Moisturize legs daily. - both legs every night before bed. Compression stocking or Garment 20-30 mm/Hg pressure to: - Apply to both legs in the morning and remove at night. Off-Loading Removable cast walker boot to: - left foot Wound Treatment Wound #3 - Foot Wound Laterality: Left, Lateral Cleanser: Normal Saline (Generic) 1 x Per Day/30 Days Discharge  Instructions: Cleanse the wound with Normal Saline prior to applying a clean dressing using gauze sponges, not tissue or cotton balls. Cleanser: Soap and Water 1 x Per Day/30 Days Discharge Instructions: May shower and wash wound with dial antibacterial soap and water prior to dressing change. Peri-Wound Care: Zinc Oxide Ointment 30g tube 1 x Per Day/30 Days Discharge Instructions: Apply Zinc Oxide to periwound with each dressing change as needed. Peri-Wound Care: Sween Lotion (Moisturizing lotion) 1 x Per Day/30 Days Discharge Instructions: Apply moisturizing lotion as directed Topical: keystone antibiotics 1 x Per Day/30 Days Discharge Instructions: apply topically daily. Prim Dressing: KerraCel Ag Gelling Fiber Dressing, 4x5 in (silver alginate) 1 x Per Day/30 Days ary Discharge Instructions: Apply silver alginate over the keystone antibiotics to wound bed as instructed Secondary Dressing: ABD Pad, 8x10 1 x Per Day/30 Days Discharge Instructions: T down before applying kerlix. Apply over primary dressing as directed. ape Secured With: [employer], 4.5x3.1 (in/yd) (Generic) 1 x Per Day/30 Days Discharge Instructions: Secure with Kerlix as directed. Secured With: 5M Medipore H Soft Cloth Surgical T 4 x 2 (in/yd) (Generic) 1 x Per Day/30 Days ape Discharge Instructions: Secure dressing with tape as directed. Add-Ons: felt 1 x Per Day/30 Days Discharge Instructions: apply felt in shoe. Electronic Signature(s) Signed: 06/18/2020 5:46:09 PM By: [name] MD Signed: 06/18/2020 5:53:43 PM By: Deon Pilling Entered By: Deon Pilling on 06/18/2020 16:19:43 -------------------------------------------------------------------------------- Problem List Details Patient Name: Date of Service: [name], [name] 06/18/2020 3:15 PM Medical Record Number: [id number] Patient Account Number: [id number] Date of Birth/Sex: Treating RN: 08-05-1985 (35 y.o. 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, right ankle and foot 10/24/2019 10/24/2019 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o. 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 He comes in for review of a wound over the plantar fifth metatarsal head extending into the lateral part of the foot. He was followed for this previously by his podiatrist Dr. Cornelius Moras. As the patient tells his story he went to see podiatry first for a swelling he developed on  the lateral part of his fifth metatarsal head in May. He states this was "open" by podiatry and the area closed. He was followed up in June and it was again opened callus removed and it closed promptly. There were plans being made for surgery on the fifth metatarsal head in June however his blood sugar was apparently too high for anesthesia. Apparently the area was debrided and opened again in June and it is never closed since. Looking over the records from podiatry I am really not able to follow this. It was clear when he was first seen it was before 5/14 at that point he already had a wound. By 5/17 the ulcer was resolved. I do not see anything about a procedure. On 5/28 noted to have pre-ulcerative moderate keratosis. X-ray noted 1/5 contracted toe and tailor's bunion and metatarsal deformity. On a visit date on 09/28/2018 the dorsal part of the left foot it healed and resolved. There was concern about swelling in his lower extremity he was sent to the ER.. As far as I can tell he was seen in the ER on 7/12 with an ulcer on his left foot. A DVT rule out of the left leg was negative. I do not think I have complete records from podiatry but I am not able to verify the procedures this patient states he had. He states after the last procedure the wound has never closed although I am not able to follow this in the records I have from podiatry. He has not had a recent x-ray The patient has been using Neosporin on the wound. He is wearing a Darco shoe. He is still very active up on his foot working and exercising. Past medical history; type 2 diabetes ketosis-prone, leg swelling with a negative DVT study in July. Non-smoker ABI in our clinic was 0.85 on the left 10/16; substantial wound on the plantar left fifth met head extending laterally almost to the dorsal fifth MTP. We have been using  silver alginate we gave him a Darco forefoot off loader. An x-ray did not show evidence of osteomyelitis did note soft  tissue emphysema which I think was due to gas tracking through an open wound. There is no doubt in my mind he requires an MRI 10/23; MRI not booked until 3 November at the earliest this is largely due to his glucose sensor in the right arm. We have been using silver alginate. There has been an improvement 10/29; I am still not exactly sure when his MRI is booked for. He says it is the third but it is the 10th in epic. This definitely needs to be done. He is running a low-grade fever today but no other symptoms. No real improvement in the 1 02/26/2019 patient presents today for a follow-up visit here in our clinic he is last been seen in the clinic on October 29. Subsequently we were working on getting MRI to evaluate and see what exactly was going on and where we would need to go from the standpoint of whether or not he had osteomyelitis and again what treatments were going be required. Subsequently the patient ended up being admitted to the hospital on 02/07/2019 and was discharged on 02/14/2019. This is a somewhat interesting admission with a discharge diagnosis of pneumonia due to COVID-19 although he was positive for COVID-19 when tested at the urgent care but negative x2 when he was actually in the hospital. With that being said he did have acute respiratory failure with hypoxia and it was noted he also have a left foot ulceration with osteomyelitis. With that being said he did require oxygen for his pneumonia and I level 4 L. He was placed on antivirals and steroids for the COVID-19. He was also transferred to the [facility] at one point. Nonetheless he did subsequently discharged home and since being home has done much better in that regard. The CT angiogram did not show any pulmonary embolism. With regard to the osteomyelitis the patient was placed on vancomycin and Zosyn while in the hospital but has been changed to Augmentin at discharge. It was also recommended that he follow- up  with wound care and podiatry. Podiatry however wanted him to see [name] according to the patient prior to them doing anything further. His hemoglobin A1c was 9.9 as noted in the hospital. Have an MRI of the left foot performed while in the hospital on 02/04/2019. 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 There is an overlying plantar open wound noted an abscess tracking back along the lateral aspect of the fifth metatarsal shaft. There is otherwise diffuse cellulitis and mild fasciitis without findings of polymyositis. The patient did have recently pneumonia secondary to COVID-19 I looked in the chart through epic and it does appear that the patient may need to have an additional x-ray just to ensure everything is cleared and that he has no airspace disease prior to putting him into the [name]. 03/05/2019; patient was readmitted to the clinic last week. He was hospitalized twice for a viral upper respiratory tract infection from 11/1 through 11/4 and then 11/5 through 11/12 ultimately this turned out to be Covid pneumonitis. Although he was discharged on oxygen he is not using it. He says he feels fine. He has no exercise limitation no cough no sputum. His O2 sat in our clinic today was 100% on room air. He did manage to have his  MRI which showed septic arthritis at the fifth MTP joint and osteomyelitis involving the fifth metatarsal head and proximal phalanx. He received Vanco and Zosyn in the hospital and then was discharged on 2 weeks of Augmentin. I do not see any relevant cultures. He was supposed to follow-up with infectious disease but I do not see that he has an appointment. 12/8; patient saw Dr. Ronnie Derby of infectious disease last week. He felt that he had had adequate antibiotic therapy. He did not go to follow-up with Dr. Logan Bores of podiatry and I have again talked to him about the pros and cons of this.  He does not want to consider a ray amputation of this time. He is aware of the risks of recurrence, migration etc. He started HBO today and tolerated this well. He can complete the Augmentin that I gave him last week. I have looked over the lab work that Dr. Effie Shy ordered his C-reactive protein was 3.3 and his sedimentation rate was 17. The C-reactive protein is never really been measurably that high in this patient 12/15; not much change in the wound today however he has undermining along the lateral part of the foot again more extensively than last week. He has some rims of epithelialization. We have been using silver alginate. He is undergoing hyperbarics but did not dive today 12/18; in for his obligatory first total contact cast change. Unfortunately there was pus coming from the undermining area around his fifth metatarsal head. This was cultured but will preclude reapplication of a cast. He is seen in conjunction with HBO 12/24; patient had staph lugdunensis in the wound in the undermining area laterally last time. We put him on doxycycline which should have covered this. The wound looks better today. I am going to give him another week of doxycycline before reattempting the total contact cast 12/31; the patient is completing antibiotics. Hemorrhagic debris in the distal part of the wound with some undermining distally. He also had hyper granulation. Extensive debridement with a #5 curette. The infected area that was on the lateral part of the fifth met head is closed over. I do not think he needs any more antibiotics. Patient was seen prior to HBO. Preparations for a total contact cast were made in the cast will be placed post hyperbarics 04/11/19; once again the patient arrives today without complaint. He had been in a cast all week noted that he had heavy drainage this week. This resulted in large raised areas of macerated tissue around the wound 1/14; wound bed looks better slightly  smaller. Hydrofera Blue has been changing himself. He had a heavy drainage last week which caused a lot of maceration around the wound so I took him out of a total contact cast he says the drainage is actually better this week He is seen today in conjunction with HBO 1/21; returns to clinic. He was up in [place] for a day or 2 attending a funeral. He comes back in with the wound larger and with a large area of exposed bone. He had osteomyelitis and septic arthritis of the fifth left metatarsal head while he was in hospital. He received IV antibiotics in the hospital for a prolonged period of time then 3 weeks of Augmentin. Subsequently I gave him 2 weeks of doxycycline for more superficial wound infection. When I saw this last week the wound was smaller the surface of the wound looks satisfactory. 1/28; patient missed hyperbarics today. Bone biopsy I did last time showed Enterococcus faecalis  and Staphylococcus lugdunensis . He has a wide area of exposed bone. We are going to use silver alginate as of today. I had another ethical discussion with the patient. 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 In this situation I think the likelihood of healing this is low. Therefore I have recommended a ray amputation and with the patient's agreement I have referred him to Dr. Victorino Dike. The other issue is that his compliance with hyperbarics has been minimal because of his work schedule and given his underlying decision I am going to stop this today READMISSION 10/24/2019 MRI 09/29/2019 left foot IMPRESSION: 1. Apparent skin ulceration inferior and lateral to the 5th metatarsal base with underlying heterogeneous T2 signal and enhancement in the subcutaneous fat. Small peripherally enhancing fluid collections along the plantar and lateral aspects of the 5th metatarsal base suspicious for abscesses. 2. Interval amputation through the mid 5th metatarsal with nonspecific  low-level marrow edema and enhancement. Given the proximity to the adjacent soft tissue inflammatory changes, osteomyelitis cannot be excluded. 3. The additional bones appear unremarkable. MRI 09/29/2019 right foot IMPRESSION: 1. Soft tissue ulceration lateral to the 5th MTP joint. There is low-level T2 hyperintensity within the 4th and 5th metatarsal heads and adjacent proximal phalanges without abnormal T1 signal or cortical destruction. These findings are nonspecific and could be seen with early marrow edema, hyperemia or early osteomyelitis. No evidence of septic joint. 2. Mild tenosynovitis and synovial enhancement associated with the extensor digitorum tendons at the level of the midfoot. 3. Diffuse low-level muscular T2 hyperintensity and enhancement, most consistent with diabetic myopathy. LEFT FOOT BONE Methicillin resistant staphylococcus aureus Staphylococcus lugdunensis MIC MIC CIPROFLOXACIN >=8 RESISTANT Resistant <=0.5 SENSI... Sensitive CLINDAMYCIN <=0.25 SENS... Sensitive >=8 RESISTANT Resistant ERYTHROMYCIN >=8 RESISTANT Resistant >=8 RESISTANT Resistant GENTAMICIN <=0.5 SENSI... Sensitive <=0.5 SENSI... Sensitive Inducible Clindamycin NEGATIVE Sensitive NEGATIVE Sensitive OXACILLIN >=4 RESISTANT Resistant 2 SENSITIVE Sensitive RIFAMPIN <=0.5 SENSI... Sensitive <=0.5 SENSI... Sensitive TETRACYCLINE <=1 SENSITIVE Sensitive <=1 SENSITIVE Sensitive TRIMETH/SULFA <=10 SENSIT Sensitive <=10 SENSIT Sensitive ... Marland Kitchen.. VANCOMYCIN 1 SENSITIVE Sensitive <=0.5 SENSI... Sensitive Right foot bone . Component 3 wk ago Specimen Description BONE Special Requests RIGHT 4 METATARSAL SAMPLE B Gram Stain NO WBC SEEN NO ORGANISMS SEEN Culture RARE METHICILLIN RESISTANT STAPHYLOCOCCUS AUREUS NO ANAEROBES ISOLATED Performed at [facility], [address] [place], [place] [address] Report Status 10/08/2019 FINAL Organism ID, Bacteria METHICILLIN RESISTANT STAPHYLOCOCCUS  AUREUS Resulting Agency [facility] Susceptibility Methicillin resistant staphylococcus aureus MIC CIPROFLOXACIN >=8 RESISTANT Resistant CLINDAMYCIN <=0.25 SENS... Sensitive ERYTHROMYCIN >=8 RESISTANT Resistant GENTAMICIN <=0.5 SENSI... Sensitive Inducible Clindamycin NEGATIVE Sensitive OXACILLIN >=4 RESISTANT Resistant RIFAMPIN <=0.5 SENSI... Sensitive TETRACYCLINE <=1 SENSITIVE Sensitive TRIMETH/SULFA <=10 SENSIT Sensitive ... VANCOMYCIN 1 SENSITIVE Sensitive This is a patient we had in clinic earlier this year with a wound over his left fifth metatarsal head. 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 Victorino Dike on 05/16/2019. He seems to have developed problems with wounds on his bilateral feet in June 2021 although he may have had problems earlier than this. He was in an urgent care with a right foot ulcer on 09/26/2019 and given a course of doxycycline. This was apparently after having trouble getting into see orthopedics. He was seen  by podiatry on 09/28/2019 noted to have bilateral lower extremity ulcers including the left lateral fifth metatarsal base and the right subfifth met head. It was noted that had purulent drainage at that time. He required hospitalization from 6/20 through 7/2. This was because of worsening right foot wounds. He underwent bilateral operative incision and drainage and bone biopsies bilaterally. Culture results are listed above. He has been referred back to clinic by Dr. Jacqualyn Posey of podiatry. He is also followed by Dr. Megan Salon who saw him yesterday. He was discharged from hospital on Zyvox Flagyl and Levaquin and yesterday changed to doxycycline Flagyl and Levaquin. His inflammatory  markers on 6/26 showed a sedimentation rate of 129 and a C-reactive protein of 5. This is improved to 14 and 1.3 respectively. This would indicate improvement. ABIs in our clinic today were 1.23 on the right and 1.20 on the left 11/01/2019 on evaluation today patient appears to be doing fairly well in regard to the wounds on his feet at this point. Fortunately there is no signs of active infection at this time. No fevers, chills, nausea, vomiting, or diarrhea. He currently is seeing infectious disease and still under their care at this point. Subsequently he also has both wounds which she has not been using collagen on as he did not receive that in his packaging he did not call us and let us know that. Apparently that just was missed on the order. Nonetheless we will get that straightened out today. 8/9-Patient returns for bilateral foot wounds, using Prisma with hydrogel moistened dressings, and the wounds appear stable. Patient using surgical shoes, avoiding much pressure or weightbearing as much as possible 8/16; patient has bilateral foot wounds. 1 on the right lateral foot proximally the other is on the left mid lateral foot. Both required debridement of callus and thick skin around the wounds. We have been using silver collagen 8/27; patient has bilateral lateral foot wounds. The area on the left substantially surrounded by callus and dry skin. This was removed from the wound edge. The underlying wound is small. The area on the right measured somewhat smaller today. 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 Unfortunately I did not update his antibiotics during today's visit. 9/10 I reviewed Dr. Hale Bogus last notes he felt he had completed antibiotics his inflammatory markers were reasonably well controlled. He has a small wound on the lateral left foot and a tiny area on the right which is just above closed. He is using  Hydrofera Blue with border foam he has bilateral surgical shoes 9/24; 2 week f/u. doing well. right foot is closed. left foot still undermined. 10/14; right foot remains closed at the fifth met head. The area over the base of the left fifth metatarsal has a small open area but considerable undermining towards the plantar foot. Thick callus skin around this suggests an adequate pressure relief. We have talked about this. He says he is going to go back into his cam boot. I suggested a total contact cast he did not seem enamored with this suggestion 10/26; left foot base of the fifth metatarsal. Same condition as last time. He has skin over the area with an open wound however the skin is not adherent. He went to see Dr. Earleen Newport who did an x-ray and culture of his foot I have not reviewed the x-ray but the patient was not told anything. He is on doxycycline 11/11; since the patient was last  here he was in the emergency room on 10/30 he was concerned about swelling in the left foot. They did not do any cultures or x-rays. They changed his antibiotics to cephalexin. Previous culture showed group B strep. The cephalexin is appropriate as doxycycline has less than predictable coverage. Arrives in clinic today with swelling over this area under the wound. He also has a new wound on the right fifth metatarsal head 11/18; the patient has a difficult wound on the lateral aspect of the left fifth metatarsal head. The wound was almost ballotable last week I opened it slightly expecting to see purulence however there was just bleeding. I cultured this this was negative. X-ray unchanged. We are trying to get an MRI but I am not sure were going to be able to get this through his insurance. He also has an area on the right lateral fifth metatarsal head this looks healthier 12/3; the patient finally got our MRI. Surprisingly this did not show osteomyelitis. I did show the soft tissue ulceration at the lateral plantar  aspect of the fifth metatarsal base with a tiny residual 6 mm abscess overlying the superficial fascia I have tried to culture this area I have not been able to get this to grow anything. Nevertheless the protruding tissue looks aggravated. I suspect we should try to treat the underlying "abscess with broad-spectrum antibiotics. I am going to start him on Levaquin and Flagyl. He has much less edema in his legs and I am going to continue to wrap his legs and see him weekly 12/10. I started Levaquin and Flagyl on him last week. He just picked up the Flagyl apparently there was some delay. The worry is the wound on the left fifth metatarsal base which is substantial and worsening. His foot looks like he inverts at the ankle making this a weightbearing surface. Certainly no improvement in fact I think the measurements of this are somewhat worse. We have been using 12/17; he apparently just got the Levaquin yesterday this is 2 weeks after the fact. He has completed the Flagyl. The area over the left fifth metatarsal base still has protruding granulation tissue although it does not look quite as bad as it did some weeks ago. He has severe bilateral lymphedema although we have not been treating him for wounds on his legs this is definitely going to require compression. There was so much edema in the left I did not wish to put him in a total contact cast today. I am going to increase his compression from 3-4 layer. The area on the right lateral fifth met head actually look quite good and superficial. 12/23; patient arrived with callus on the right fifth met head and the substantial hyper granulated callused wound on the base of his fifth metatarsal. He says he is completing his Levaquin in 2 days but I do not think that adds up with what I gave him but I will have to double check this. We are using Hydrofera Blue on both areas. My plan is to put the left leg in a cast the week after New Year's 04/06/2020;  patient's wounds about the same. Right lateral fifth metatarsal head and left lateral foot over the base of the fifth metatarsal. There is undermining on the left lateral foot which I removed before application of total contact cast continuing with Hydrofera Blue new. Patient tells me he was seen by endocrinology today lab work was done [Dr. Kerr]. Also wondering whether he was referred to  cardiology. I went over some lab work from previously does not have chronic renal failure certainly not nephrotic range proteinuria he does have very poorly controlled diabetes but this is not his most updated lab work. Hemoglobin A1c has been over 11 1/10; the patient had a considerable amount of leakage towards mid part of his left foot with macerated skin however the wound surface looks better the area on the right lateral fifth met head is better as well. I am going to change the dressing on the left foot under the total contact cast to silver alginate, continue with Hydrofera Blue on the right. 1/20; patient was in the total contact cast for 10 days. Considerable amount of drainage although the skin around the wound does not look too bad on the left foot. The area on the right fifth metatarsal head is closed. Our nursing staff reports large amount of drainage out of the left lateral foot wound 1/25; continues with copious amounts of drainage described by our intake staff. PCR culture I did last week showed E. coli and Enterococcus faecalis and low quantities. Multiple resistance genes documented including extended spectrum beta lactamase, MRSA, MRSE, quinolone, tetracycline. The wound is not quite as good this week as it was 5 days ago but about the same size 2/3; continues with copious amounts of malodorous drainage per our intake nurse. The PCR culture I did 2 weeks ago showed E. coli and low quantities of Enterococcus. There were multiple resistance genes detected. I put Neosporin on him last week although  this does not seem to have helped. The wound is slightly deeper today. Offloading continues to be an issue here although with the amount of drainage she has a total contact cast is just not going to work 2/10; moderate amount of drainage. Patient reports he cannot get his stocking on over the dressing. I told him we have to do that the nurse gave him suggestions on how to make this work. The wound is on the bottom and lateral part of his left foot. Is cultured predominantly grew low amounts of Enterococcus, E. coli and anaerobes. There were multiple resistance genes detected including extended spectrum beta lactamase, quinolone, tetracycline. I could not think of an easy oral combination to address this so for now I am going to do topical antibiotics provided by [facility] I think the main agents here are vancomycin and an aminoglycoside. We have to be able to give him access to the wounds to get the topical antibiotic on 2/17; moderate amount of drainage this is unchanged. He has his Keystone topical antibiotic against the deep tissue culture organisms. He has been using this and changing the dressing daily. Silver alginate on the wound surface. 2/24; using Keystone antibiotic with silver alginate on the top. He had too much drainage for a total contact cast at one point although I think that is improving and I think in the next week or 2 it might be possible to replace a total contact cast I did not do this today. In general the wound surface looks healthy however he continues to have thick rims of skin and subcutaneous tissue around the wide area of the circumference which I debrided 06/04/2020 upon evaluation today patient appears to be doing well in regard to his wound. I do feel like he is showing signs of improvement. There is little bit of callus and dead tissue around the edges of the wound as well as what appears to be a little bit of a  sinus tract that is off to the side laterally I would  perform debridement to clear that away today. 3/17; left lateral foot. The wound looks about the same as I remember. Not much depth surface looks healthy. No evidence of infection Objective Constitutional Sitting or standing Blood Pressure is within target range for patient.. Pulse regular and within target range for patient.Marland Kitchen Respirations regular, non-labored and within target range.. Temperature is normal and within the target range for the patient.Marland Kitchen Appears in no distress. Vitals Time Taken: 3:40 PM, Height: 77 in, Weight: 280 lbs, BMI: 33.2, Temperature: 98.0 F, Pulse: 87 bpm, Respiratory Rate: 18 breaths/min, Blood Pressure: 130/83 mmHg, Capillary Blood Glucose: 70 mg/dl. Cardiovascular Pedal pulses are palpable. General Notes: Wound exam; some nonattached skin around the lateral aspect of the wound but most of this looks satisfactory. No major debridement. Integumentary (Hair, Skin) Wound #3 status is Open. Original cause of wound was Trauma. The date acquired was: 10/02/2019. The wound has been in treatment 34 weeks. The wound is located on the Left,Lateral Foot. The wound measures 3cm length x 2.9cm width x 0.5cm depth; 6.833cm^2 area and 3.416cm^3 volume. There is Fat Layer (Subcutaneous Tissue) exposed. There is no tunneling or undermining noted. There is a medium amount of serosanguineous drainage noted. The wound margin is thickened. There is large (67-100%) red, friable granulation within the wound bed. There is a small (1-33%) amount of necrotic tissue within the wound bed including Adherent Slough. 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 - Cast next week. Cellular or Tissue Based Products: Cellular or Tissue Based Product Type: - wound center to run insurance  authorization for approval for Dermagraft. Bathing/ Shower/ Hygiene: May shower and wash wound with soap and water. Edema Control - Lymphedema / SCD / Other: Elevate legs to the level of the heart or above for 30 minutes daily and/or when sitting, a frequency of: - throughout the day Avoid standing for long periods of time. Exercise regularly Moisturize legs daily. - both legs every night before bed. Compression stocking or Garment 20-30 mm/Hg pressure to: - Apply to both legs in the morning and remove at night. Off-Loading: Removable cast walker boot to: - left foot WOUND #3: - Foot Wound Laterality: Left, Lateral Cleanser: Normal Saline (Generic) 1 x Per Day/30 Days Discharge Instructions: Cleanse the wound with Normal Saline prior to applying a clean dressing using gauze sponges, not tissue or cotton balls. Cleanser: Soap and Water 1 x Per Day/30 Days Discharge Instructions: May shower and wash wound with dial antibacterial soap and water prior to dressing change. Peri-Wound Care: Zinc Oxide Ointment 30g tube 1 x Per Day/30 Days Discharge Instructions: Apply Zinc Oxide to periwound with each dressing change as needed. Peri-Wound Care: Sween Lotion (Moisturizing lotion) 1 x Per Day/30 Days Discharge Instructions: Apply moisturizing lotion as directed Topical: keystone antibiotics 1 x Per Day/30 Days Discharge Instructions: apply topically daily. Prim Dressing: KerraCel Ag Gelling Fiber Dressing, 4x5 in (silver alginate) 1 x Per Day/30 Days ary Discharge Instructions: Apply silver alginate over the keystone antibiotics to wound bed as instructed Secondary Dressing: ABD Pad, 8x10 1 x Per Day/30 Days Discharge Instructions: T down before applying kerlix. Apply over primary dressing as directed. ape Secured With: [employer], 4.5x3.1 (in/yd) (Generic) 1 x Per Day/30 Days Discharge Instructions: Secure with Kerlix  as directed. Secured With: 40M Medipore H Soft Cloth Surgical T 4 x 2  (in/yd) (Generic) 1 x Per Day/30 Days ape Discharge Instructions: Secure dressing with tape as directed. Add-Ons: felt 1 x Per Day/30 Days Discharge Instructions: apply felt in shoe. 1. I continued with the Aquacel Ag under compression 2. He has a cam boot but I think he is going to needed attempted a total contact cast the drainage seems better and I think we are going to put this on next week 3. Also Dermagraft through his Musician) Signed: 06/18/2020 5:46:09 PM By: [name] MD Entered By: [name] on 06/18/2020 17:32:31 -------------------------------------------------------------------------------- SuperBill Details Patient Name: Date of Service: [name], [name] 06/18/2020 Medical Record Number: [id number] Patient Account Number: [id number] Date of Birth/Sex: Treating RN: November 04, 1985 (35 y.o. [name] Primary Care Provider: [name] Other Clinician: Referring Provider: Treating Provider/Extender: [name] in Treatment: 34 Diagnosis Coding ICD-10 Codes Code Description E11.621 Type 2 diabetes mellitus with foot ulcer L97.528 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ity Facility Procedures CPT4 Code: [id number] Description: 99213 - WOUND CARE VISIT-LEV 3 EST PT Modifier: Quantity: 1 Physician Procedures : CPT4 Code Description Modifier [id number] - WC PHYS LEVEL 3 - EST PT 1 ICD-10 Diagnosis Description E11.621 Type 2 diabetes mellitus with foot ulcer L97.528 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ity Quantity: Electronic Signature(s) Signed: 06/18/2020 5:46:09 PM By: [name] MD Entered By: [name] on 06/18/2020 17:32:45

## 2020-06-18 NOTE — Progress Notes (Signed)
Azam, [name] ([id number]) Visit Report for 06/18/2020 Arrival Information Details Patient Name: Date of Service: [name] 06/18/2020 3:15 PM Medical Record Number: [id number] Patient Account Number: [id number] Date of Birth/Sex: Treating RN: [birth year] (35 y.o. [name], Meta.Reding Primary Care Lousie Calico: [name] Other Clinician: Referring Geet Hosking: Treating Japji Kok/Extender: [name] in Treatment: 62 Visit Information History Since Last Visit Added or deleted any medications: No Patient Arrived: Ambulatory Any new allergies or adverse reactions: No Arrival Time: 15:40 Had a fall or experienced change in No Accompanied By: self activities of daily living that may affect Transfer Assistance: None risk of falls: Patient Identification Verified: Yes Signs or symptoms of abuse/neglect since last visito No Secondary Verification Process Completed: Yes Hospitalized since last visit: No Patient Requires Transmission-Based Precautions: No Implantable device outside of the clinic excluding No Patient Has Alerts: No cellular tissue based products placed in the center since last visit: Has Dressing in Place as Prescribed: Yes Pain Present Now: No Electronic Signature(s) Signed: 06/18/2020 5:41:28 PM By: Sandre Kitty Entered By: Sandre Kitty on 06/18/2020 15:40:21 -------------------------------------------------------------------------------- Clinic Level of Care Assessment Details Patient Name: Date of Service: [name] 06/18/2020 3:15 PM Medical Record Number: [id number] Patient Account Number: [id number] Date of Birth/Sex: Treating RN: [birth year] (35 y.o. [name] Primary Care Zyah Gomm: [name] Other Clinician: Referring Shenita Trego: Treating Yanique Mulvihill/Extender: [name] in Treatment: 74 Clinic Level of Care Assessment Items TOOL 4 Quantity Score X- 1 0 Use when only an EandM is  performed on FOLLOW-UP visit ASSESSMENTS - Nursing Assessment / Reassessment X- 1 10 Reassessment of Co-morbidities (includes updates in patient status) X- 1 5 Reassessment of Adherence to Treatment Plan ASSESSMENTS - Wound and Skin A ssessment / Reassessment X - Simple Wound Assessment / Reassessment - one wound 1 5 []  - 0 Complex Wound Assessment / Reassessment - multiple wounds X- 1 10 Dermatologic / Skin Assessment (not related to wound area) ASSESSMENTS - Focused Assessment X- 1 5 Circumferential Edema Measurements - multi extremities X- 1 10 Nutritional Assessment / Counseling / Intervention []  - 0 Lower Extremity Assessment (monofilament, tuning fork, pulses) []  - 0 Peripheral Arterial Disease Assessment (using hand held doppler) ASSESSMENTS - Ostomy and/or Continence Assessment and Care []  - 0 Incontinence Assessment and Management []  - 0 Ostomy Care Assessment and Management (repouching, etc.) PROCESS - Coordination of Care X - Simple Patient / Family Education for ongoing care 1 15 []  - 0 Complex (extensive) Patient / Family Education for ongoing care X- 1 10 Staff obtains Programmer, systems, Records, T Results / Process Orders est []  - 0 Staff telephones HHA, Nursing Homes / Clarify orders / etc []  - 0 Routine Transfer to another Facility (non-emergent condition) []  - 0 Routine Hospital Admission (non-emergent condition) []  - 0 New Admissions / Biomedical engineer / Ordering NPWT Apligraf, etc. , []  - 0 Emergency Hospital Admission (emergent condition) X- 1 10 Simple Discharge Coordination []  - 0 Complex (extensive) Discharge Coordination PROCESS - Special Needs []  - 0 Pediatric / Minor Patient Management []  - 0 Isolation Patient Management []  - 0 Hearing / Language / Visual special needs []  - 0 Assessment of Community assistance (transportation, [name]/C planning, etc.) []  - 0 Additional assistance / Altered mentation []  - 0 Support Surface(s) Assessment  (bed, cushion, seat, etc.) INTERVENTIONS - Wound Cleansing / Measurement X - Simple Wound Cleansing - one wound 1 5 []  - 0 Complex Wound Cleansing - multiple wounds X-  1 5 Wound Imaging (photographs - any number of wounds) []  - 0 Wound Tracing (instead of photographs) X- 1 5 Simple Wound Measurement - one wound []  - 0 Complex Wound Measurement - multiple wounds INTERVENTIONS - Wound Dressings X - Small Wound Dressing one or multiple wounds 1 10 []  - 0 Medium Wound Dressing one or multiple wounds []  - 0 Large Wound Dressing one or multiple wounds X- 1 5 Application of Medications - topical []  - 0 Application of Medications - injection INTERVENTIONS - Miscellaneous []  - 0 External ear exam []  - 0 Specimen Collection (cultures, biopsies, blood, body fluids, etc.) []  - 0 Specimen(s) / Culture(s) sent or taken to Lab for analysis []  - 0 Patient Transfer (multiple staff / Civil Service fast streamer / Similar devices) []  - 0 Simple Staple / Suture removal (25 or less) []  - 0 Complex Staple / Suture removal (26 or more) []  - 0 Hypo / Hyperglycemic Management (close monitor of Blood Glucose) []  - 0 Ankle / Brachial Index (ABI) - do not check if billed separately X- 1 5 Vital Signs Has the patient been seen at the hospital within the last three years: Yes Total Score: 115 Level Of Care: New/Established - Level 3 Electronic Signature(s) Signed: 06/18/2020 5:53:43 PM By: Deon Pilling Entered By: Deon Pilling on 06/18/2020 16:43:12 -------------------------------------------------------------------------------- Lower Extremity Assessment Details Patient Name: Date of Service: [name] 06/18/2020 3:15 PM Medical Record Number: [id number] Patient Account Number: [id number] Date of Birth/Sex: Treating RN: 12-25-1985 (35 y.o. [name] Primary Care Dewain Platz: [name] Other Clinician: Referring Sherika Kubicki: Treating Denzell Colasanti/Extender: [name] in  Treatment: 34 Edema Assessment Assessed: [name]: No] [name]: No] Edema: [Left: Ye] [Right: s] Calf Left: Right: Point of Measurement: 48 cm From Medial Instep 42.4 cm Ankle Left: Right: Point of Measurement: 10 cm From Medial Instep 27 cm Vascular Assessment Pulses: Dorsalis Pedis Palpable: [Left:Yes] Electronic Signature(s) Signed: 06/18/2020 5:43:09 PM By: [name] Signed: 06/18/2020 5:53:43 PM By: Deon Pilling Entered By: [name] on 06/18/2020 15:57:46 -------------------------------------------------------------------------------- Multi Wound Chart Details Patient Name: Date of Service: [name], [name] 06/18/2020 3:15 PM Medical Record Number: [id number] Patient Account Number: [id number] Date of Birth/Sex: Treating RN: 1985-04-14 (35 y.o. [name] Primary Care Rotha Cassels: [name] Other Clinician: Referring Joleigh Mineau: Treating Adiel Mcnamara/Extender: [name] in Treatment: 34 Vital Signs Height(in): 77 Capillary Blood Glucose(mg/dl): 70 Weight(lbs): 280 Pulse(bpm): 17 Body Mass Index(BMI): 33 Blood Pressure(mmHg): 130/83 Temperature(F): 98.0 Respiratory Rate(breaths/min): 18 Photos: [N/A:N/A] Left, Lateral Foot N/A N/A Wound Location: Trauma N/A N/A Wounding Event: Diabetic Wound/Ulcer of the Lower N/A N/A Primary Etiology: Extremity Type II Diabetes N/A N/A Comorbid History: 10/02/2019 N/A N/A Date Acquired: 58 N/A N/A Weeks of Treatment: Open N/A N/A Wound Status: 3x2.9x0.5 N/A N/A Measurements L x W x [name] 6.833 N/A N/A A (cm) : rea 3.416 N/A N/A Volume (cm) : -314.40% N/A N/A % Reduction in A rea: -1970.30% N/A N/A % Reduction in Volume: Grade 2 N/A N/A Classification: Medium N/A N/A Exudate A mount: Serosanguineous N/A N/A Exudate Type: red, brown N/A N/A Exudate Color: Thickened N/A N/A Wound Margin: Large (67-100%) N/A N/A Granulation A mount: Red, Friable N/A N/A Granulation  Quality: Small (1-33%) N/A N/A Necrotic A mount: Fat Layer (Subcutaneous Tissue): Yes N/A N/A Exposed Structures: Fascia: No Tendon: No Muscle: No Joint: No Bone: No Small (1-33%) N/A N/A Epithelialization: Callused Periwound N/A N/A Assessment Notes: Treatment Notes Electronic Signature(s) Signed:  06/18/2020 5:46:09 PM By: [name] MD Signed: 06/18/2020 5:53:43 PM By: Deon Pilling Entered By: [name] on 06/18/2020 17:29:53 -------------------------------------------------------------------------------- Multi-Disciplinary Care Plan Details Patient Name: Date of Service: [name], [name] 06/18/2020 3:15 PM Medical Record Number: [id number] Patient Account Number: [id number] Date of Birth/Sex: Treating RN: 26-Feb-1986 (35 y.o. [name] Primary Care Obdulia Steier: [name] Other Clinician: Referring Ericia Moxley: Treating Jyll Tomaro/Extender: [name] in Treatment: 35 Multidisciplinary Care Plan reviewed with physician Active Inactive Nutrition Nursing Diagnoses: Imbalanced nutrition Potential for alteratiion in Nutrition/Potential for imbalanced nutrition Goals: Patient/caregiver agrees to and verbalizes understanding of need to use nutritional supplements and/or vitamins as prescribed Date Initiated: 10/24/2019 Date Inactivated: 04/06/2020 Target Resolution Date: 04/03/2020 Goal Status: Met Patient/caregiver will maintain therapeutic glucose control Date Initiated: 10/24/2019 Target Resolution Date: 07/31/2020 Goal Status: Active Interventions: Assess HgA1c results as ordered upon admission and as needed Assess patient nutrition upon admission and as needed per policy Provide education on elevated blood sugars and impact on wound healing Provide education on nutrition Treatment Activities: Education provided on Nutrition : 04/28/2020 Notes: Electronic Signature(s) Signed: 06/18/2020 5:53:43 PM By: Deon Pilling Entered By:  Deon Pilling on 06/18/2020 16:00:17 -------------------------------------------------------------------------------- Pain Assessment Details Patient Name: Date of Service: [name] 06/18/2020 3:15 PM Medical Record Number: [id number] Patient Account Number: [id number] Date of Birth/Sex: Treating RN: May 02, 1985 (35 y.o. [name] Primary Care Jeriko Kowalke: [name] Other Clinician: Referring Katheen Aslin: Treating Kendon Sedeno/Extender: [name] in Treatment: 34 Active Problems Location of Pain Severity and Description of Pain Patient Has Paino No Site Locations Pain Management and Medication Current Pain Management: Electronic Signature(s) Signed: 06/18/2020 5:41:28 PM By: Sandre Kitty Signed: 06/18/2020 5:53:43 PM By: Deon Pilling Entered By: Sandre Kitty on 06/18/2020 15:41:59 -------------------------------------------------------------------------------- Patient/Caregiver Education Details Patient Name: Date of Service: [name] 3/17/2022andnbsp3:15 PM Medical Record Number: [id number] Patient Account Number: [id number] Date of Birth/Gender: Treating RN: 02/16/1986 (35 y.o. [name] Primary Care Physician: [name] Other Clinician: Referring Physician: Treating Physician/Extender: [name] in Treatment: 34 Education Assessment Education Provided To: Patient Education Topics Provided Nutrition: Handouts: Elevated Blood Sugars: How Do They Affect Wound Healing Methods: Explain/Verbal Responses: State content correctly Electronic Signature(s) Signed: 06/18/2020 5:53:43 PM By: Deon Pilling Entered By: Deon Pilling on 06/18/2020 16:00:54 -------------------------------------------------------------------------------- Wound Assessment Details Patient Name: Date of Service: [name] 06/18/2020 3:15 PM Medical Record Number: [id number] Patient Account Number:  [id number] Date of Birth/Sex: Treating RN: [birth year] (35 y.o. [name] Primary Care Kirsti Mcalpine: [name] Other Clinician: Referring Leah Skora: Treating Malori Myers/Extender: [name] in Treatment: 34 Wound Status Wound Number: 3 Primary Etiology: Diabetic Wound/Ulcer of the Lower Extremity Wound Location: Left, Lateral Foot Wound Status: Open Wounding Event: Trauma Comorbid History: Type II Diabetes Date Acquired: 10/02/2019 Weeks Of Treatment: 34 Clustered Wound: No Photos Photo Uploaded By: Sandre Kitty on 06/18/2020 16:43:59 Wound Measurements Length: (cm) 3 Width: (cm) 2.9 Depth: (cm) 0.5 Area: (cm) 6.833 Volume: (cm) 3.416 % Reduction in Area: -314.4% % Reduction in Volume: -1970.3% Epithelialization: Small (1-33%) Tunneling: No Undermining: No Wound Description Classification: Grade 2 Wound Margin: Thickened Exudate Amount: Medium Exudate Type: Serosanguineous Exudate Color: red, brown Foul Odor After Cleansing: No Slough/Fibrino Yes Wound Bed Granulation Amount: Large (67-100%) Exposed Structure Granulation Quality: Red, Friable Fascia Exposed: No Necrotic Amount: Small (1-33%) Fat Layer (Subcutaneous Tissue) Exposed: Yes Necrotic Quality: Adherent Slough Tendon Exposed: No Muscle Exposed:  No Joint Exposed: No Bone Exposed: No Assessment Notes Callused Periwound Electronic Signature(s) Signed: 06/18/2020 5:43:09 PM By: [name] Signed: 06/18/2020 5:53:43 PM By: Deon Pilling Entered By: [name] on 06/18/2020 15:56:35 -------------------------------------------------------------------------------- [place] Details Patient Name: Date of Service: [name], [name] 06/18/2020 3:15 PM Medical Record Number: [id number] Patient Account Number: [id number] Date of Birth/Sex: Treating RN: 09/13/1985 (35 y.o. [name] Primary Care Benjy Kana: [name] Other Clinician: Referring  Devonia Farro: Treating Diarra Kos/Extender: [name] in Treatment: 34 Vital Signs Time Taken: 15:40 Temperature (F): 98.0 Height (in): 77 Pulse (bpm): 87 Weight (lbs): 280 Respiratory Rate (breaths/min): 18 Body Mass Index (BMI): 33.2 Blood Pressure (mmHg): 130/83 Capillary Blood Glucose (mg/dl): 70 Reference Range: 80 - 120 mg / dl Electronic Signature(s) Signed: 06/18/2020 5:41:28 PM By: Sandre Kitty Entered By: Sandre Kitty on 06/18/2020 15:41:50

## 2020-06-25 ENCOUNTER — Encounter (HOSPITAL_BASED_OUTPATIENT_CLINIC_OR_DEPARTMENT_OTHER): Payer: BC Managed Care – PPO | Admitting: Internal Medicine

## 2020-06-26 ENCOUNTER — Encounter (HOSPITAL_BASED_OUTPATIENT_CLINIC_OR_DEPARTMENT_OTHER): Payer: BC Managed Care – PPO | Admitting: Internal Medicine

## 2020-06-26 ENCOUNTER — Other Ambulatory Visit: Payer: Self-pay

## 2020-06-26 DIAGNOSIS — E11621 Type 2 diabetes mellitus with foot ulcer: Secondary | ICD-10-CM | POA: Diagnosis not present

## 2020-06-29 NOTE — Progress Notes (Signed)
[place], [name] ([id number]) Visit Report for 06/26/2020 Debridement Details Patient Name: Date of Service: [name] 06/26/2020 4:00 PM Medical Record Number: [id number] Patient Account Number: [id number] Date of Birth/Sex: Treating RN: 01-11-1986 (35 y.o. [name] Primary Care Provider: [name] Other Clinician: Referring Provider: Treating Provider/Extender: [name] in Treatment: 35 Debridement Performed for Assessment: Wound #3 Left,Lateral Foot Performed By: Physician [name], MD Debridement Type: Debridement Severity of Tissue Pre Debridement: Fat layer exposed Level of Consciousness (Pre-procedure): Awake and Alert Pre-procedure Verification/Time Out Yes - 17:20 Taken: Start Time: 17:21 Pain Control: Other : Benzocaine T Area Debrided (L x W): otal 2.8 (cm) x 3.4 (cm) = 9.52 (cm) Tissue and other material debrided: Non-Viable, Callus, Subcutaneous Level: Skin/Subcutaneous Tissue Debridement Description: Excisional Instrument: Curette Bleeding: Moderate Hemostasis Achieved: Silver Nitrate End Time: 17:27 Response to Treatment: Procedure was tolerated well Level of Consciousness (Post- Awake and Alert procedure): Post Debridement Measurements of Total Wound Length: (cm) 2.8 Width: (cm) 3.4 Depth: (cm) 0.4 Volume: (cm) 2.991 Character of Wound/Ulcer Post Debridement: Stable Severity of Tissue Post Debridement: Fat layer exposed Post Procedure Diagnosis Same as Pre-procedure Electronic Signature(s) Signed: 06/29/2020 9:50:14 AM By: [name] MD Signed: 06/29/2020 5:20:59 PM By: [name] Previous Signature: 06/26/2020 5:34:53 PM Version By: [name] Entered By: [name] on 06/28/2020 07:55:19 -------------------------------------------------------------------------------- HPI Details Patient Name: Date of Service: [name], [name] 06/26/2020 4:00 PM Medical Record Number:  [id number] Patient Account Number: [id number] Date of Birth/Sex: Treating RN: [birth year] (35 y.o. 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 He comes in for review of a wound over the plantar fifth metatarsal head extending into the lateral part of the foot. He was followed for this previously by his podiatrist Dr. Cornelius Moras. As the patient tells his story he went to see podiatry first for a swelling he developed on the lateral part of his fifth metatarsal head in May. He states this was "open" by podiatry and the area closed. He was followed up in June and it was again opened callus removed and it closed promptly. There were plans being made for surgery on the fifth metatarsal head in June however his blood sugar was apparently too high for anesthesia. Apparently the area was debrided and opened again in June and it is never closed since. Looking over the records from podiatry I am really not able to follow this. It was clear when he was first seen it was before 5/14 at that point he already had a wound. By 5/17 the ulcer was resolved. I do not see anything about a procedure. On 5/28 noted to have pre-ulcerative moderate keratosis. X-ray noted 1/5 contracted toe and tailor's bunion and metatarsal deformity. On a visit date on 09/28/2018 the dorsal part of the left foot it healed and resolved. There was concern about swelling in his lower extremity he was sent to the ER.. As far as I can tell he was seen in the ER on 7/12 with an ulcer on his left foot. A DVT rule out of the left leg was negative. I do not think I have complete records from podiatry but I am not able to verify the procedures this patient states he had. He  states after the  last procedure the wound has never closed although I am not able to follow this in the records I have from podiatry. He has not had a recent x-ray The patient has been using Neosporin on the wound. He is wearing a Darco shoe. He is still very active up on his foot working and exercising. Past medical history; type 2 diabetes ketosis-prone, leg swelling with a negative DVT study in July. Non-smoker ABI in our clinic was 0.85 on the left 10/16; substantial wound on the plantar left fifth met head extending laterally almost to the dorsal fifth MTP. We have been using silver alginate we gave him a Darco forefoot off loader. An x-ray did not show evidence of osteomyelitis did note soft tissue emphysema which I think was due to gas tracking through an open wound. There is no doubt in my mind he requires an MRI 10/23; MRI not booked until 3 November at the earliest this is largely due to his glucose sensor in the right arm. We have been using silver alginate. There has been an improvement 10/29; I am still not exactly sure when his MRI is booked for. He says it is the third but it is the 10th in epic. This definitely needs to be done. He is running a low-grade fever today but no other symptoms. No real improvement in the 1 02/26/2019 patient presents today for a follow-up visit here in our clinic he is last been seen in the clinic on October 29. Subsequently we were working on getting MRI to evaluate and see what exactly was going on and where we would need to go from the standpoint of whether or not he had osteomyelitis and again what treatments were going be required. Subsequently the patient ended up being admitted to the hospital on 02/07/2019 and was discharged on 02/14/2019. This is a somewhat interesting admission with a discharge diagnosis of pneumonia due to COVID-19 although he was positive for COVID-19 when tested at the urgent care but negative x2 when he was actually in the  hospital. With that being said he did have acute respiratory failure with hypoxia and it was noted he also have a left foot ulceration with osteomyelitis. With that being said he did require oxygen for his pneumonia and I level 4 L. He was placed on antivirals and steroids for the COVID-19. He was also transferred to the [facility] at one point. Nonetheless he did subsequently discharged home and since being home has done much better in that regard. The CT angiogram did not show any pulmonary embolism. With regard to the osteomyelitis the patient was placed on vancomycin and Zosyn while in the hospital but has been changed to Augmentin at discharge. It was also recommended that he follow- up with wound care and podiatry. Podiatry however wanted him to see [name] according to the patient prior to them doing anything further. His hemoglobin A1c was 9.9 as noted in the hospital. Have an MRI of the left foot performed while in the hospital on 02/04/2019. 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 There is an overlying plantar open wound noted an abscess tracking back along the lateral aspect of the fifth metatarsal shaft. There is otherwise diffuse cellulitis and mild fasciitis without findings of polymyositis. The patient did have recently pneumonia secondary to COVID-19 I looked in the chart through epic and it does appear that the patient may need to have  an additional x-ray just to ensure everything is cleared and that he has no airspace disease prior to putting him into the [name]. 03/05/2019; patient was readmitted to the clinic last week. He was hospitalized twice for a viral upper respiratory tract infection from 11/1 through 11/4 and then 11/5 through 11/12 ultimately this turned out to be Covid pneumonitis. Although he was discharged on oxygen he is not using it. He says he feels fine. He has no  exercise limitation no cough no sputum. His O2 sat in our clinic today was 100% on room air. 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 He received Vanco and Zosyn in the hospital and then was discharged on 2 weeks of Augmentin. I do not see any relevant cultures. He was supposed to follow-up with infectious disease but I do not see that he has an appointment. 12/8; patient saw Dr. Novella Olive of infectious disease last week. He felt that he had had adequate antibiotic therapy. He did not go to follow-up with Dr. Amalia Hailey of podiatry and I have again talked to him about the pros and cons of this. He does not want to consider a ray amputation of this time. He is aware of the risks of recurrence, migration etc. He started HBO today and tolerated this well. He can complete the Augmentin that I gave him last week. I have looked over the lab work that Dr. Chana Bode ordered his C-reactive protein was 3.3 and his sedimentation rate was 17. The C-reactive protein is never really been measurably that high in this patient 12/15; not much change in the wound today however he has undermining along the lateral part of the foot again more extensively than last week. He has some rims of epithelialization. We have been using silver alginate. He is undergoing hyperbarics but did not dive today 12/18; in for his obligatory first total contact cast change. Unfortunately there was pus coming from the undermining area around his fifth metatarsal head. This was cultured but will preclude reapplication of a cast. He is seen in conjunction with HBO 12/24; patient had staph lugdunensis in the wound in the undermining area laterally last time. We put him on doxycycline which should have covered this. The wound looks better today. I am going to give him another week of doxycycline before reattempting the total contact  cast 12/31; the patient is completing antibiotics. Hemorrhagic debris in the distal part of the wound with some undermining distally. He also had hyper granulation. Extensive debridement with a #5 curette. The infected area that was on the lateral part of the fifth met head is closed over. I do not think he needs any more antibiotics. Patient was seen prior to HBO. Preparations for a total contact cast were made in the cast will be placed post hyperbarics 04/11/19; once again the patient arrives today without complaint. He had been in a cast all week noted that he had heavy drainage this week. This resulted in large raised areas of macerated tissue around the wound 1/14; wound bed looks better slightly smaller. Hydrofera Blue has been changing himself. He had a heavy drainage last week which caused a lot of maceration around the wound so I took him out of a total contact cast he says the drainage is actually better this week He is seen today in conjunction with HBO 1/21; returns to clinic. He was up in [place]  for a day or 2 attending a funeral. He comes back in with the wound larger and with a large area of exposed bone. He had osteomyelitis and septic arthritis of the fifth left metatarsal head while he was in hospital. He received IV antibiotics in the hospital for a prolonged period of time then 3 weeks of Augmentin. Subsequently I gave him 2 weeks of doxycycline for more superficial wound infection. When I saw this last week the wound was smaller the surface of the wound looks satisfactory. 1/28; patient missed hyperbarics today. Bone biopsy I did last time showed Enterococcus faecalis and Staphylococcus lugdunensis . He has a wide area of exposed bone. We are going to use silver alginate as of today. I had another ethical discussion with the patient. 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 In this situation I think the likelihood of healing this is low.  Therefore I have recommended a ray amputation and with the patient's agreement I have referred him to Dr. Doran Durand. The other issue is that his compliance with hyperbarics has been minimal because of his work schedule and given his underlying decision I am going to stop this today READMISSION 10/24/2019 MRI 09/29/2019 left foot IMPRESSION: 1. Apparent skin ulceration inferior and lateral to the 5th metatarsal base with underlying heterogeneous T2 signal and enhancement in the subcutaneous fat. Small peripherally enhancing fluid collections along the plantar and lateral aspects of the 5th metatarsal base suspicious for abscesses. 2. Interval amputation through the mid 5th metatarsal with nonspecific low-level marrow edema and enhancement. Given the proximity to the adjacent soft tissue inflammatory changes, osteomyelitis cannot be excluded. 3. The additional bones appear unremarkable. MRI 09/29/2019 right foot IMPRESSION: 1. Soft tissue ulceration lateral to the 5th MTP joint. There is low-level T2 hyperintensity within the 4th and 5th metatarsal heads and adjacent proximal phalanges without abnormal T1 signal or cortical destruction. These findings are nonspecific and could be seen with early marrow edema, hyperemia or early osteomyelitis. No evidence of septic joint. 2. Mild tenosynovitis and synovial enhancement associated with the extensor digitorum tendons at the level of the midfoot. 3. Diffuse low-level muscular T2 hyperintensity and enhancement, most consistent with diabetic myopathy. LEFT FOOT BONE Methicillin resistant staphylococcus aureus Staphylococcus lugdunensis MIC MIC CIPROFLOXACIN >=8 RESISTANT Resistant <=0.5 SENSI... Sensitive CLINDAMYCIN <=0.25 SENS... Sensitive >=8 RESISTANT Resistant ERYTHROMYCIN >=8 RESISTANT Resistant >=8 RESISTANT Resistant GENTAMICIN <=0.5 SENSI... Sensitive <=0.5 SENSI... Sensitive Inducible Clindamycin NEGATIVE Sensitive NEGATIVE  Sensitive OXACILLIN >=4 RESISTANT Resistant 2 SENSITIVE Sensitive RIFAMPIN <=0.5 SENSI... Sensitive <=0.5 SENSI... Sensitive TETRACYCLINE <=1 SENSITIVE Sensitive <=1 SENSITIVE Sensitive TRIMETH/SULFA <=10 SENSIT Sensitive <=10 SENSIT Sensitive ... Marland Kitchen.. VANCOMYCIN 1 SENSITIVE Sensitive <=0.5 SENSI... Sensitive Right foot bone . Component 3 wk ago Specimen Description BONE Special Requests RIGHT 4 METATARSAL SAMPLE B Gram Stain NO WBC SEEN NO ORGANISMS SEEN Culture RARE METHICILLIN RESISTANT STAPHYLOCOCCUS AUREUS NO ANAEROBES ISOLATED Performed at [facility], [place] [address] [place], [place] [address] Report Status 10/08/2019 FINAL Organism ID, Bacteria METHICILLIN RESISTANT STAPHYLOCOCCUS AUREUS Resulting Agency [facility] Susceptibility Methicillin resistant staphylococcus aureus MIC CIPROFLOXACIN >=8 RESISTANT Resistant CLINDAMYCIN <=0.25 SENS... Sensitive ERYTHROMYCIN >=8 RESISTANT Resistant GENTAMICIN <=0.5 SENSI... Sensitive Inducible Clindamycin NEGATIVE Sensitive OXACILLIN >=4 RESISTANT Resistant RIFAMPIN <=0.5 SENSI... Sensitive TETRACYCLINE <=1 SENSITIVE Sensitive TRIMETH/SULFA <=10 SENSIT Sensitive ... VANCOMYCIN 1 SENSITIVE Sensitive This is a patient we had in clinic earlier this year with a wound over his left fifth metatarsal head. 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 Doran Durand on 05/16/2019. He seems to have developed problems with wounds on his bilateral feet in June 2021 although he may have had problems earlier than this. He was in an urgent care with a right foot ulcer on 09/26/2019 and given a course of doxycycline. This was apparently after having trouble getting into see  orthopedics. He was seen by podiatry on 09/28/2019 noted to have bilateral lower extremity ulcers including the left lateral fifth metatarsal base and the right subfifth met head. It was noted that had purulent drainage at that time. He required hospitalization from 6/20 through 7/2. This was because of worsening right foot wounds. He underwent bilateral operative incision and drainage and bone biopsies bilaterally. Culture results are listed above. He has been referred back to clinic by Dr. Jacqualyn Posey of podiatry. He is also followed by Dr. Megan Salon who saw him yesterday. He was discharged from hospital on Zyvox Flagyl and Levaquin and yesterday changed to doxycycline Flagyl and Levaquin. His inflammatory markers on 6/26 showed a sedimentation rate of 129 and a C-reactive protein of 5. This is improved to 14 and 1.3 respectively. This would indicate improvement. ABIs in our clinic today were 1.23 on the right and 1.20 on the left 11/01/2019 on evaluation today patient appears to be doing fairly well in regard to the wounds on his feet at this point. Fortunately there is no signs of active infection at this time. No fevers, chills, nausea, vomiting, or diarrhea. He currently is seeing infectious disease and still under their care at this point. Subsequently he also has both wounds which she has not been using collagen on as he did not receive that in his packaging he did not call us and let us know that. Apparently that just was missed on the order. Nonetheless we will get that straightened out today. 8/9-Patient returns for bilateral foot wounds, using Prisma with hydrogel moistened dressings, and the wounds appear stable. Patient using surgical shoes, avoiding much pressure or weightbearing as much as possible 8/16; patient has bilateral foot wounds. 1 on the right lateral foot proximally the other is on the left mid lateral foot. Both required debridement of callus and thick skin around the wounds. We  have been using silver collagen 8/27; patient has bilateral lateral foot wounds. The area on the left substantially surrounded by callus and dry skin. This was removed from the wound edge. The underlying wound is small. The area on the right measured somewhat smaller today. 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 Unfortunately I did not update his antibiotics during today's visit. 9/10 I reviewed Dr. Hale Bogus last notes he felt he had completed antibiotics his inflammatory markers were reasonably well controlled. He has a small wound on the lateral left foot and a tiny area on the right which is just above closed. He is using Hydrofera Blue with border foam he has bilateral surgical shoes 9/24; 2 week f/u. doing well. right foot is closed. left foot still undermined. 10/14; right foot remains closed at the fifth met head. The area over the base of the left fifth metatarsal has a small open area but considerable undermining towards the plantar foot. Thick callus skin around this  suggests an adequate pressure relief. We have talked about this. He says he is going to go back into his cam boot. I suggested a total contact cast he did not seem enamored with this suggestion 10/26; left foot base of the fifth metatarsal. Same condition as last time. He has skin over the area with an open wound however the skin is not adherent. He went to see Dr. Earleen Newport who did an x-ray and culture of his foot I have not reviewed the x-ray but the patient was not told anything. He is on doxycycline 11/11; since the patient was last here he was in the emergency room on 10/30 he was concerned about swelling in the left foot. They did not do any cultures or x-rays. They changed his antibiotics to cephalexin. Previous culture showed group B strep. The cephalexin is appropriate as doxycycline has less than predictable coverage. Arrives in clinic today with  swelling over this area under the wound. He also has a new wound on the right fifth metatarsal head 11/18; the patient has a difficult wound on the lateral aspect of the left fifth metatarsal head. The wound was almost ballotable last week I opened it slightly expecting to see purulence however there was just bleeding. I cultured this this was negative. X-ray unchanged. We are trying to get an MRI but I am not sure were going to be able to get this through his insurance. He also has an area on the right lateral fifth metatarsal head this looks healthier 12/3; the patient finally got our MRI. Surprisingly this did not show osteomyelitis. I did show the soft tissue ulceration at the lateral plantar aspect of the fifth metatarsal base with a tiny residual 6 mm abscess overlying the superficial fascia I have tried to culture this area I have not been able to get this to grow anything. Nevertheless the protruding tissue looks aggravated. I suspect we should try to treat the underlying "abscess with broad-spectrum antibiotics. I am going to start him on Levaquin and Flagyl. He has much less edema in his legs and I am going to continue to wrap his legs and see him weekly 12/10. I started Levaquin and Flagyl on him last week. He just picked up the Flagyl apparently there was some delay. The worry is the wound on the left fifth metatarsal base which is substantial and worsening. His foot looks like he inverts at the ankle making this a weightbearing surface. Certainly no improvement in fact I think the measurements of this are somewhat worse. We have been using 12/17; he apparently just got the Levaquin yesterday this is 2 weeks after the fact. He has completed the Flagyl. The area over the left fifth metatarsal base still has protruding granulation tissue although it does not look quite as bad as it did some weeks ago. He has severe bilateral lymphedema although we have not been treating him for wounds on his  legs this is definitely going to require compression. There was so much edema in the left I did not wish to put him in a total contact cast today. I am going to increase his compression from 3-4 layer. The area on the right lateral fifth met head actually look quite good and superficial. 12/23; patient arrived with callus on the right fifth met head and the substantial hyper granulated callused wound on the base of his fifth metatarsal. He says he is completing his Levaquin in 2 days but I do not think that adds up  with what I gave him but I will have to double check this. We are using Hydrofera Blue on both areas. My plan is to put the left leg in a cast the week after New Year's 04/06/2020; patient's wounds about the same. Right lateral fifth metatarsal head and left lateral foot over the base of the fifth metatarsal. There is undermining on the left lateral foot which I removed before application of total contact cast continuing with Hydrofera Blue new. Patient tells me he was seen by endocrinology today lab work was done [Dr. Kerr]. Also wondering whether he was referred to cardiology. I went over some lab work from previously does not have chronic renal failure certainly not nephrotic range proteinuria he does have very poorly controlled diabetes but this is not his most updated lab work. Hemoglobin A1c has been over 11 1/10; the patient had a considerable amount of leakage towards mid part of his left foot with macerated skin however the wound surface looks better the area on the right lateral fifth met head is better as well. I am going to change the dressing on the left foot under the total contact cast to silver alginate, continue with Hydrofera Blue on the right. 1/20; patient was in the total contact cast for 10 days. Considerable amount of drainage although the skin around the wound does not look too bad on the left foot. The area on the right fifth metatarsal head is closed. Our nursing  staff reports large amount of drainage out of the left lateral foot wound 1/25; continues with copious amounts of drainage described by our intake staff. PCR culture I did last week showed E. coli and Enterococcus faecalis and low quantities. Multiple resistance genes documented including extended spectrum beta lactamase, MRSA, MRSE, quinolone, tetracycline. The wound is not quite as good this week as it was 5 days ago but about the same size 2/3; continues with copious amounts of malodorous drainage per our intake nurse. The PCR culture I did 2 weeks ago showed E. coli and low quantities of Enterococcus. There were multiple resistance genes detected. I put Neosporin on him last week although this does not seem to have helped. The wound is slightly deeper today. Offloading continues to be an issue here although with the amount of drainage she has a total contact cast is just not going to work 2/10; moderate amount of drainage. Patient reports he cannot get his stocking on over the dressing. I told him we have to do that the nurse gave him suggestions on how to make this work. The wound is on the bottom and lateral part of his left foot. Is cultured predominantly grew low amounts of Enterococcus, E. coli and anaerobes. There were multiple resistance genes detected including extended spectrum beta lactamase, quinolone, tetracycline. I could not think of an easy oral combination to address this so for now I am going to do topical antibiotics provided by [facility] I think the main agents here are vancomycin and an aminoglycoside. We have to be able to give him access to the wounds to get the topical antibiotic on 2/17; moderate amount of drainage this is unchanged. He has his Keystone topical antibiotic against the deep tissue culture organisms. He has been using this and changing the dressing daily. Silver alginate on the wound surface. 2/24; using Keystone antibiotic with silver alginate on the top. He  had too much drainage for a total contact cast at one point although I think that is improving and I  think in the next week or 2 it might be possible to replace a total contact cast I did not do this today. In general the wound surface looks healthy however he continues to have thick rims of skin and subcutaneous tissue around the wide area of the circumference which I debrided 06/04/2020 upon evaluation today patient appears to be doing well in regard to his wound. I do feel like he is showing signs of improvement. There is little bit of callus and dead tissue around the edges of the wound as well as what appears to be a little bit of a sinus tract that is off to the side laterally I would perform debridement to clear that away today. 3/17; left lateral foot. The wound looks about the same as I remember. Not much depth surface looks healthy. No evidence of infection 3/25; left lateral foot. Wound surface looks about the same. Separating epithelium from the circumference. There really is no evidence of infection here however not making progress by my view Electronic Signature(s) Signed: 06/29/2020 9:50:14 AM By: [name] MD Entered By: [name] on 06/28/2020 07:56:16 -------------------------------------------------------------------------------- Physical Exam Details Patient Name: Date of Service: [name], [name] 06/26/2020 4:00 PM Medical Record Number: [id number] Patient Account Number: [id number] Date of Birth/Sex: Treating RN: 1985/08/29 (35 y.o. [name] Primary Care Provider: [name] Other Clinician: Referring Provider: Treating Provider/Extender: [name] in Treatment: 70 Constitutional Patient is hypertensive.. Pulse regular and within target range for patient.Marland Kitchen Respirations regular, non-labored and within target range.. Temperature is normal and within the target range for the patient.Marland Kitchen Appears in no  distress. Cardiovascular pulses are palpable. edema control in the wound at site looks acceptable. Notes Wound exam; not much change from last week. In fact the separating epithelium superiorly tells me there is too much pressure on this wound. I used a #5 curet to remove the separating epithelium and then on the surface of the wound febrile illness debris. Hemostasis with silver nitrate and direct pressure Electronic Signature(s) Signed: 06/29/2020 9:50:14 AM By: [name] MD Entered By: [name] on 06/28/2020 08:00:16 -------------------------------------------------------------------------------- Physician Orders Details Patient Name: Date of Service: [name], [name] 06/26/2020 4:00 PM Medical Record Number: [id number] Patient Account Number: [id number] Date of Birth/Sex: Treating RN: May 22, 1985 (35 y.o. [name] Primary Care Provider: [name] Other Clinician: Referring Provider: Treating Provider/Extender: [name] in Treatment: 48 Verbal / Phone Orders: No Diagnosis Coding Follow-up Appointments Return Appointment in 1 week. Cellular or Tissue Based Products Cellular or Tissue Based Product Type: - Dermagraft approved but has 20% copay. Hold off for now per Dr. Dellia Nims. Bathing/ Shower/ Hygiene May shower and wash wound with soap and water. Edema Control - Lymphedema / SCD / Other Bilateral Lower Extremities Elevate legs to the level of the heart or above for 30 minutes daily and/or when sitting, a frequency of: - throughout the day Avoid standing for long periods of time. Exercise regularly Moisturize legs daily. - both legs every night before bed. Compression stocking or Garment 20-30 mm/Hg pressure to: - Apply to right leg in the morning and remove at night. Off-Loading Total Contact Cast to Left Lower Extremity Wound Treatment Wound #3 - Foot Wound Laterality: Left, Lateral Cleanser: Normal Saline (Generic) 1 x  Per Day/30 Days Discharge Instructions: Cleanse the wound with Normal Saline prior to applying a clean dressing using gauze sponges, not tissue or cotton balls. Cleanser: Soap and Water 1  x Per Day/30 Days Discharge Instructions: May shower and wash wound with dial antibacterial soap and water prior to dressing change. Peri-Wound Care: Zinc Oxide Ointment 30g tube 1 x Per Day/30 Days Discharge Instructions: Apply Zinc Oxide to periwound with each dressing change as needed. Peri-Wound Care: Sween Lotion (Moisturizing lotion) 1 x Per Day/30 Days Discharge Instructions: Apply moisturizing lotion as directed Topical: 1 x Per Day/30 Days Prim Dressing: Hydrofera Blue Ready Foam, 2.5 x2.5 in 1 x Per Day/30 Days ary Discharge Instructions: Apply to wound bed as instructed Secondary Dressing: Woven Gauze Sponge, Non-Sterile 4x4 in 1 x Per Day/30 Days Discharge Instructions: Apply over primary dressing as directed. Secondary Dressing: ABD Pad, 8x10 1 x Per Day/30 Days Discharge Instructions: T down before applying kerlix. Apply over primary dressing as directed. ape Secured With: [employer], 4.5x3.1 (in/yd) (Generic) 1 x Per Day/30 Days Discharge Instructions: Secure with Kerlix as directed. Secured With: 80M Medipore H Soft Cloth Surgical T 4 x 2 (in/yd) (Generic) 1 x Per Day/30 Days ape Discharge Instructions: Secure dressing with tape as directed. Add-Ons: 1 x Per Day/30 Days Electronic Signature(s) Signed: 06/26/2020 6:16:52 PM By: [name] Signed: 06/29/2020 9:50:14 AM By: [name] MD Entered By: [name] on 06/26/2020 17:42:06 -------------------------------------------------------------------------------- Problem List Details Patient Name: Date of Service: [name], [name] 06/26/2020 4:00 PM Medical Record Number: [id number] Patient Account Number: [id number] Date of Birth/Sex: Treating RN: 17-Apr-1985 (35 y.o. 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, right ankle and foot 10/24/2019 10/24/2019 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, left ankle and foot 10/24/2019 10/24/2019 B95.62 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 10/24/2019 10/24/2019 classified elsewhere Resolved Problems Electronic Signature(s) Signed: 06/29/2020 9:50:14 AM By: [name] MD Entered By: [name] on 06/28/2020 07:54:37 -------------------------------------------------------------------------------- Progress Note Details Patient Name: Date of Service: [name], [name] 06/26/2020 4:00 PM Medical Record Number: [id number] Patient Account Number: [id number] Date of Birth/Sex: Treating RN: 29-Dec-1985 (35 y.o. 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 He comes in for review of a wound over the plantar fifth metatarsal head extending into the lateral part of the foot. He was followed for this previously by his podiatrist Dr. Cornelius Moras. As the patient tells his story he went to see podiatry first for a swelling he developed on the lateral part  of his fifth metatarsal head in May. He states this was "open" by podiatry and the area closed. He was followed up in June and it was again opened callus removed and it closed promptly. There were plans being made for surgery on the fifth metatarsal head in June however his blood sugar was apparently too high for anesthesia. Apparently the area was debrided and opened again in June and it is never closed since. Looking over  the records from podiatry I am really not able to follow this. It was clear when he was first seen it was before 5/14 at that point he already had a wound. By 5/17 the ulcer was resolved. I do not see anything about a procedure. On 5/28 noted to have pre-ulcerative moderate keratosis. X-ray noted 1/5 contracted toe and tailor's bunion and metatarsal deformity. On a visit date on 09/28/2018 the dorsal part of the left foot it healed and resolved. There was concern about swelling in his lower extremity he was sent to the ER.. As far as I can tell he was seen in the ER on 7/12 with an ulcer on his left foot. A DVT rule out of the left leg was negative. I do not think I have complete records from podiatry but I am not able to verify the procedures this patient states he had. He states after the last procedure the wound has never closed although I am not able to follow this in the records I have from podiatry. He has not had a recent x-ray The patient has been using Neosporin on the wound. He is wearing a Darco shoe. He is still very active up on his foot working and exercising. Past medical history; type 2 diabetes ketosis-prone, leg swelling with a negative DVT study in July. Non-smoker ABI in our clinic was 0.85 on the left 10/16; substantial wound on the plantar left fifth met head extending laterally almost to the dorsal fifth MTP. We have been using silver alginate we gave him a Darco forefoot off loader. An x-ray did not show evidence of osteomyelitis did note soft tissue emphysema  which I think was due to gas tracking through an open wound. There is no doubt in my mind he requires an MRI 10/23; MRI not booked until 3 November at the earliest this is largely due to his glucose sensor in the right arm. We have been using silver alginate. There has been an improvement 10/29; I am still not exactly sure when his MRI is booked for. He says it is the third but it is the 10th in epic. This definitely needs to be done. He is running a low-grade fever today but no other symptoms. No real improvement in the 1 02/26/2019 patient presents today for a follow-up visit here in our clinic he is last been seen in the clinic on October 29. Subsequently we were working on getting MRI to evaluate and see what exactly was going on and where we would need to go from the standpoint of whether or not he had osteomyelitis and again what treatments were going be required. Subsequently the patient ended up being admitted to the hospital on 02/07/2019 and was discharged on 02/14/2019. This is a somewhat interesting admission with a discharge diagnosis of pneumonia due to COVID-19 although he was positive for COVID-19 when tested at the urgent care but negative x2 when he was actually in the hospital. With that being said he did have acute respiratory failure with hypoxia and it was noted he also have a left foot ulceration with osteomyelitis. With that being said he did require oxygen for his pneumonia and I level 4 L. He was placed on antivirals and steroids for the COVID-19. He was also transferred to the [place] at one point. Nonetheless he did subsequently discharged home and since being home has done much better in that regard. The CT angiogram did not show any pulmonary embolism. With regard to the  osteomyelitis the patient was placed on vancomycin and Zosyn while in the hospital but has been changed to Augmentin at discharge. It was also recommended that he follow- up with wound care and  podiatry. Podiatry however wanted him to see [name] according to the patient prior to them doing anything further. His hemoglobin A1c was 9.9 as noted in the hospital. Have an MRI of the left foot performed while in the hospital on 02/04/2019. 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 There is an overlying plantar open wound noted an abscess tracking back along the lateral aspect of the fifth metatarsal shaft. There is otherwise diffuse cellulitis and mild fasciitis without findings of polymyositis. The patient did have recently pneumonia secondary to COVID-19 I looked in the chart through epic and it does appear that the patient may need to have an additional x-ray just to ensure everything is cleared and that he has no airspace disease prior to putting him into the [name]. 03/05/2019; patient was readmitted to the clinic last week. He was hospitalized twice for a viral upper respiratory tract infection from 11/1 through 11/4 and then 11/5 through 11/12 ultimately this turned out to be Covid pneumonitis. Although he was discharged on oxygen he is not using it. He says he feels fine. He has no exercise limitation no cough no sputum. His O2 sat in our clinic today was 100% on room air. 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 He received Vanco and Zosyn in the hospital and then was discharged on 2 weeks of Augmentin. I do not see any relevant cultures. He was supposed to follow-up with infectious disease but I do not see that he has an appointment. 12/8; patient saw Dr. Novella Olive of infectious disease last week. He felt that he had had adequate antibiotic therapy. He did not go to follow-up with Dr. Amalia Hailey of podiatry and I have again talked to him about the pros and cons of this. He does not want to  consider a ray amputation of this time. He is aware of the risks of recurrence, migration etc. He started HBO today and tolerated this well. He can complete the Augmentin that I gave him last week. I have looked over the lab work that Dr. Chana Bode ordered his C-reactive protein was 3.3 and his sedimentation rate was 17. The C-reactive protein is never really been measurably that high in this patient 12/15; not much change in the wound today however he has undermining along the lateral part of the foot again more extensively than last week. He has some rims of epithelialization. We have been using silver alginate. He is undergoing hyperbarics but did not dive today 12/18; in for his obligatory first total contact cast change. Unfortunately there was pus coming from the undermining area around his fifth metatarsal head. This was cultured but will preclude reapplication of a cast. He is seen in conjunction with HBO 12/24; patient had staph lugdunensis in the wound in the undermining area laterally last time. We put him on doxycycline which should have covered this. The wound looks better today. I am going to give him another week of doxycycline before reattempting the total contact cast 12/31; the patient is completing antibiotics. Hemorrhagic debris in the distal part of the wound with some undermining distally. He also had hyper granulation.  Extensive debridement with a #5 curette. The infected area that was on the lateral part of the fifth met head is closed over. I do not think he needs any more antibiotics. Patient was seen prior to HBO. Preparations for a total contact cast were made in the cast will be placed post hyperbarics 04/11/19; once again the patient arrives today without complaint. He had been in a cast all week noted that he had heavy drainage this week. This resulted in large raised areas of macerated tissue around the wound 1/14; wound bed looks better slightly smaller. Hydrofera Blue has  been changing himself. He had a heavy drainage last week which caused a lot of maceration around the wound so I took him out of a total contact cast he says the drainage is actually better this week He is seen today in conjunction with HBO 1/21; returns to clinic. He was up in [place] for a day or 2 attending a funeral. He comes back in with the wound larger and with a large area of exposed bone. He had osteomyelitis and septic arthritis of the fifth left metatarsal head while he was in hospital. He received IV antibiotics in the hospital for a prolonged period of time then 3 weeks of Augmentin. Subsequently I gave him 2 weeks of doxycycline for more superficial wound infection. When I saw this last week the wound was smaller the surface of the wound looks satisfactory. 1/28; patient missed hyperbarics today. Bone biopsy I did last time showed Enterococcus faecalis and Staphylococcus lugdunensis . He has a wide area of exposed bone. We are going to use silver alginate as of today. I had another ethical discussion with the patient. 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 In this situation I think the likelihood of healing this is low. Therefore I have recommended a ray amputation and with the patient's agreement I have referred him to Dr. Doran Durand. The other issue is that his compliance with hyperbarics has been minimal because of his work schedule and given his underlying decision I am going to stop this today READMISSION 10/24/2019 MRI 09/29/2019 left foot IMPRESSION: 1. Apparent skin ulceration inferior and lateral to the 5th metatarsal base with underlying heterogeneous T2 signal and enhancement in the subcutaneous fat. Small peripherally enhancing fluid collections along the plantar and lateral aspects of the 5th metatarsal base suspicious for abscesses. 2. Interval amputation through the mid 5th metatarsal with nonspecific low-level marrow edema and  enhancement. Given the proximity to the adjacent soft tissue inflammatory changes, osteomyelitis cannot be excluded. 3. The additional bones appear unremarkable. MRI 09/29/2019 right foot IMPRESSION: 1. Soft tissue ulceration lateral to the 5th MTP joint. There is low-level T2 hyperintensity within the 4th and 5th metatarsal heads and adjacent proximal phalanges without abnormal T1 signal or cortical destruction. These findings are nonspecific and could be seen with early marrow edema, hyperemia or early osteomyelitis. No evidence of septic joint. 2. Mild tenosynovitis and synovial enhancement associated with the extensor digitorum tendons at the level of the midfoot. 3. Diffuse low-level muscular T2 hyperintensity and enhancement, most consistent with diabetic myopathy. LEFT FOOT BONE Methicillin resistant staphylococcus aureus Staphylococcus lugdunensis MIC MIC CIPROFLOXACIN >=8 RESISTANT Resistant <=0.5 SENSI... Sensitive CLINDAMYCIN <=0.25 SENS... Sensitive >=8 RESISTANT Resistant ERYTHROMYCIN >=8 RESISTANT Resistant >=8 RESISTANT Resistant GENTAMICIN <=0.5 SENSI... Sensitive <=0.5 SENSI... Sensitive Inducible Clindamycin NEGATIVE Sensitive NEGATIVE Sensitive OXACILLIN >=4 RESISTANT Resistant 2 SENSITIVE Sensitive RIFAMPIN <=0.5 SENSI... Sensitive <=0.5 SENSI... Sensitive TETRACYCLINE <=1 SENSITIVE Sensitive <=1  SENSITIVE Sensitive TRIMETH/SULFA <=10 SENSIT Sensitive <=10 SENSIT Sensitive ... Marland Kitchen.. VANCOMYCIN 1 SENSITIVE Sensitive <=0.5 SENSI... Sensitive Right foot bone . Component 3 wk ago Specimen Description BONE Special Requests RIGHT 4 METATARSAL SAMPLE B Gram Stain NO WBC SEEN NO ORGANISMS SEEN Culture RARE METHICILLIN RESISTANT STAPHYLOCOCCUS AUREUS NO ANAEROBES ISOLATED Performed at [facility], [place] [address], [place], [place] [address] Report Status 10/08/2019 FINAL Organism ID, Bacteria METHICILLIN RESISTANT STAPHYLOCOCCUS AUREUS Resulting Agency [facility] Susceptibility Methicillin resistant staphylococcus aureus MIC CIPROFLOXACIN >=8 RESISTANT Resistant CLINDAMYCIN <=0.25 SENS... Sensitive ERYTHROMYCIN >=8 RESISTANT Resistant GENTAMICIN <=0.5 SENSI... Sensitive Inducible Clindamycin NEGATIVE Sensitive OXACILLIN >=4 RESISTANT Resistant RIFAMPIN <=0.5 SENSI... Sensitive TETRACYCLINE <=1 SENSITIVE Sensitive TRIMETH/SULFA <=10 SENSIT Sensitive ... VANCOMYCIN 1 SENSITIVE Sensitive This is a patient we had in clinic earlier this year with a wound over his left fifth metatarsal head. 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 Doran Durand on 05/16/2019. He seems to have developed problems with wounds on his bilateral feet in June 2021 although he may have had problems earlier than this. He was in an urgent care with a right foot ulcer on 09/26/2019 and given a course of doxycycline. This was apparently after having trouble getting into see orthopedics. He was seen by podiatry on 09/28/2019 noted to have bilateral lower extremity ulcers including the left lateral fifth metatarsal base and the right subfifth met head. It was noted that had purulent drainage at that time. He required hospitalization from 6/20 through 7/2. This was because of worsening right foot wounds. He underwent bilateral operative incision and drainage and bone biopsies bilaterally. Culture results are listed above. He has been referred back to clinic by Dr. Jacqualyn Posey of podiatry. He is also followed by Dr. Megan Salon who saw him yesterday. He was discharged from hospital on Zyvox Flagyl and Levaquin and yesterday changed to doxycycline Flagyl and Levaquin. His inflammatory markers on 6/26 showed a  sedimentation rate of 129 and a C-reactive protein of 5. This is improved to 14 and 1.3 respectively. This would indicate improvement. ABIs in our clinic today were 1.23 on the right and 1.20 on the left 11/01/2019 on evaluation today patient appears to be doing fairly well in regard to the wounds on his feet at this point. Fortunately there is no signs of active infection at this time. No fevers, chills, nausea, vomiting, or diarrhea. He currently is seeing infectious disease and still under their care at this point. Subsequently he also has both wounds which she has not been using collagen on as he did not receive that in his packaging he did not call us and let us know that. Apparently that just was missed on the order. Nonetheless we will get that straightened out today. 8/9-Patient returns for bilateral foot wounds, using Prisma with hydrogel moistened dressings, and the wounds appear stable. Patient using surgical shoes, avoiding much pressure or weightbearing as much as possible 8/16; patient has bilateral foot wounds. 1 on the right lateral foot proximally the other is on the left mid lateral foot. Both required debridement of callus and thick skin around the wounds. We have been using silver collagen 8/27; patient has bilateral lateral foot wounds. The area on the left substantially surrounded by callus and  dry skin. This was removed from the wound edge. The underlying wound is small. The area on the right measured somewhat smaller today. 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 Unfortunately I did not update his antibiotics during today's visit. 9/10 I reviewed Dr. Hale Bogus last notes he felt he had completed antibiotics his inflammatory markers were reasonably well controlled. He has a small wound on the lateral left foot and a tiny area on the right which is just above closed. He is using Hydrofera Blue with border foam  he has bilateral surgical shoes 9/24; 2 week f/u. doing well. right foot is closed. left foot still undermined. 10/14; right foot remains closed at the fifth met head. The area over the base of the left fifth metatarsal has a small open area but considerable undermining towards the plantar foot. Thick callus skin around this suggests an adequate pressure relief. We have talked about this. He says he is going to go back into his cam boot. I suggested a total contact cast he did not seem enamored with this suggestion 10/26; left foot base of the fifth metatarsal. Same condition as last time. He has skin over the area with an open wound however the skin is not adherent. He went to see Dr. Earleen Newport who did an x-ray and culture of his foot I have not reviewed the x-ray but the patient was not told anything. He is on doxycycline 11/11; since the patient was last here he was in the emergency room on 10/30 he was concerned about swelling in the left foot. They did not do any cultures or x-rays. They changed his antibiotics to cephalexin. Previous culture showed group B strep. The cephalexin is appropriate as doxycycline has less than predictable coverage. Arrives in clinic today with swelling over this area under the wound. He also has a new wound on the right fifth metatarsal head 11/18; the patient has a difficult wound on the lateral aspect of the left fifth metatarsal head. The wound was almost ballotable last week I opened it slightly expecting to see purulence however there was just bleeding. I cultured this this was negative. X-ray unchanged. We are trying to get an MRI but I am not sure were going to be able to get this through his insurance. He also has an area on the right lateral fifth metatarsal head this looks healthier 12/3; the patient finally got our MRI. Surprisingly this did not show osteomyelitis. I did show the soft tissue ulceration at the lateral plantar aspect of the fifth metatarsal base  with a tiny residual 6 mm abscess overlying the superficial fascia I have tried to culture this area I have not been able to get this to grow anything. Nevertheless the protruding tissue looks aggravated. I suspect we should try to treat the underlying "abscess with broad-spectrum antibiotics. I am going to start him on Levaquin and Flagyl. He has much less edema in his legs and I am going to continue to wrap his legs and see him weekly 12/10. I started Levaquin and Flagyl on him last week. He just picked up the Flagyl apparently there was some delay. The worry is the wound on the left fifth metatarsal base which is substantial and worsening. His foot looks like he inverts at the ankle making this a weightbearing surface. Certainly no improvement in fact I think the measurements of this are somewhat worse. We have been using 12/17; he apparently just  got the Levaquin yesterday this is 2 weeks after the fact. He has completed the Flagyl. The area over the left fifth metatarsal base still has protruding granulation tissue although it does not look quite as bad as it did some weeks ago. He has severe bilateral lymphedema although we have not been treating him for wounds on his legs this is definitely going to require compression. There was so much edema in the left I did not wish to put him in a total contact cast today. I am going to increase his compression from 3-4 layer. The area on the right lateral fifth met head actually look quite good and superficial. 12/23; patient arrived with callus on the right fifth met head and the substantial hyper granulated callused wound on the base of his fifth metatarsal. He says he is completing his Levaquin in 2 days but I do not think that adds up with what I gave him but I will have to double check this. We are using Hydrofera Blue on both areas. My plan is to put the left leg in a cast the week after New Year's 04/06/2020; patient's wounds about the same. Right  lateral fifth metatarsal head and left lateral foot over the base of the fifth metatarsal. There is undermining on the left lateral foot which I removed before application of total contact cast continuing with Hydrofera Blue new. Patient tells me he was seen by endocrinology today lab work was done [Dr. Kerr]. Also wondering whether he was referred to cardiology. I went over some lab work from previously does not have chronic renal failure certainly not nephrotic range proteinuria he does have very poorly controlled diabetes but this is not his most updated lab work. Hemoglobin A1c has been over 11 1/10; the patient had a considerable amount of leakage towards mid part of his left foot with macerated skin however the wound surface looks better the area on the right lateral fifth met head is better as well. I am going to change the dressing on the left foot under the total contact cast to silver alginate, continue with Hydrofera Blue on the right. 1/20; patient was in the total contact cast for 10 days. Considerable amount of drainage although the skin around the wound does not look too bad on the left foot. The area on the right fifth metatarsal head is closed. Our nursing staff reports large amount of drainage out of the left lateral foot wound 1/25; continues with copious amounts of drainage described by our intake staff. PCR culture I did last week showed E. coli and Enterococcus faecalis and low quantities. Multiple resistance genes documented including extended spectrum beta lactamase, MRSA, MRSE, quinolone, tetracycline. The wound is not quite as good this week as it was 5 days ago but about the same size 2/3; continues with copious amounts of malodorous drainage per our intake nurse. The PCR culture I did 2 weeks ago showed E. coli and low quantities of Enterococcus. There were multiple resistance genes detected. I put Neosporin on him last week although this does not seem to have helped. The  wound is slightly deeper today. Offloading continues to be an issue here although with the amount of drainage she has a total contact cast is just not going to work 2/10; moderate amount of drainage. Patient reports he cannot get his stocking on over the dressing. I told him we have to do that the nurse gave him suggestions on how to make this work. The wound is  on the bottom and lateral part of his left foot. Is cultured predominantly grew low amounts of Enterococcus, E. coli and anaerobes. There were multiple resistance genes detected including extended spectrum beta lactamase, quinolone, tetracycline. I could not think of an easy oral combination to address this so for now I am going to do topical antibiotics provided by [facility] I think the main agents here are vancomycin and an aminoglycoside. We have to be able to give him access to the wounds to get the topical antibiotic on 2/17; moderate amount of drainage this is unchanged. He has his Keystone topical antibiotic against the deep tissue culture organisms. He has been using this and changing the dressing daily. Silver alginate on the wound surface. 2/24; using Keystone antibiotic with silver alginate on the top. He had too much drainage for a total contact cast at one point although I think that is improving and I think in the next week or 2 it might be possible to replace a total contact cast I did not do this today. In general the wound surface looks healthy however he continues to have thick rims of skin and subcutaneous tissue around the wide area of the circumference which I debrided 06/04/2020 upon evaluation today patient appears to be doing well in regard to his wound. I do feel like he is showing signs of improvement. There is little bit of callus and dead tissue around the edges of the wound as well as what appears to be a little bit of a sinus tract that is off to the side laterally I would perform debridement to clear that away  today. 3/17; left lateral foot. The wound looks about the same as I remember. Not much depth surface looks healthy. No evidence of infection 3/25; left lateral foot. Wound surface looks about the same. Separating epithelium from the circumference. There really is no evidence of infection here however not making progress by my view Objective Constitutional Patient is hypertensive.. Pulse regular and within target range for patient.Marland Kitchen Respirations regular, non-labored and within target range.. Temperature is normal and within the target range for the patient.Marland Kitchen Appears in no distress. Vitals Time Taken: 4:27 PM, Height: 77 in, Weight: 280 lbs, BMI: 33.2, Temperature: 98.5 F, Pulse: 99 bpm, Respiratory Rate: 16 breaths/min, Blood Pressure: 150/88 mmHg, Capillary Blood Glucose: 139 mg/dl. General Notes: glucose per pt report Cardiovascular pulses are palpable. edema control in the wound at site looks acceptable. General Notes: Wound exam; not much change from last week. In fact the separating epithelium superiorly tells me there is too much pressure on this wound. I used a #5 curet to remove the separating epithelium and then on the surface of the wound febrile illness debris. Hemostasis with silver nitrate and direct pressure Integumentary (Hair, Skin) Wound #3 status is Open. Original cause of wound was Trauma. The date acquired was: 10/02/2019. The wound has been in treatment 35 weeks. The wound is located on the Left,Lateral Foot. The wound measures 2.8cm length x 3.4cm width x 0.4cm depth; 7.477cm^2 area and 2.991cm^3 volume. There is Fat Layer (Subcutaneous Tissue) exposed. There is no tunneling or undermining noted. There is a medium amount of serosanguineous drainage noted. The wound margin is thickened. There is large (67-100%) red granulation within the wound bed. There is a small (1-33%) amount of necrotic tissue within the wound bed including Adherent Slough. 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 There was a Excisional Skin/Subcutaneous Tissue Debridement with a total area of 9.52 sq cm performed by [name], MD. With the following instrument(s): Curette to remove Non-Viable tissue/material. Material removed includes Callus and Subcutaneous Tissue and after achieving pain control using Other (Benzocaine). No specimens were taken. A time out was conducted at 17:20, prior to the start of the procedure. A Moderate amount of bleeding was controlled with Silver Nitrate. The procedure was tolerated well. Post Debridement Measurements: 2.8cm length x 3.4cm width x 0.4cm depth; 2.991cm^3 volume. Character of Wound/Ulcer Post Debridement is stable. Severity of Tissue Post Debridement is: Fat layer exposed. Post procedure Diagnosis Wound #3: Same as Pre-Procedure Pre-procedure diagnosis of Wound #3 is a Diabetic Wound/Ulcer of the Lower Extremity located on the Left,Lateral Foot . There was a T Contact Cast otal Procedure by [name], MD. Post procedure Diagnosis Wound #3: Same as Pre-Procedure Plan Follow-up Appointments: Return Appointment in 1 week. Cellular or Tissue Based Products: Cellular or Tissue Based Product Type: - Dermagraft approved but has 20% copay. Hold off for now per Dr. Dellia Nims. Bathing/ Shower/ Hygiene: May shower and wash wound with soap and water. Edema Control - Lymphedema / SCD / Other: Elevate legs to the level of the heart or above for 30 minutes daily and/or when sitting, a frequency of: - throughout the day Avoid standing for long periods of time. Exercise regularly Moisturize legs daily. - both legs every  night before bed. Compression stocking or Garment 20-30 mm/Hg pressure to: - Apply to right leg in the morning and remove at night. Off-Loading: T Contact Cast to Left Lower Extremity otal WOUND #3: - Foot Wound Laterality: Left, Lateral Cleanser: Normal Saline (Generic) 1 x Per Day/30 Days Discharge Instructions: Cleanse the wound with Normal Saline prior to applying a clean dressing using gauze sponges, not tissue or cotton balls. Cleanser: Soap and Water 1 x Per Day/30 Days Discharge Instructions: May shower and wash wound with dial antibacterial soap and water prior to dressing change. Peri-Wound Care: Zinc Oxide Ointment 30g tube 1 x Per Day/30 Days Discharge Instructions: Apply Zinc Oxide to periwound with each dressing change as needed. Peri-Wound Care: Sween Lotion (Moisturizing lotion) 1 x Per Day/30 Days Discharge Instructions: Apply moisturizing lotion as directed Topical: 1 x Per Day/30 Days Prim Dressing: Hydrofera Blue Ready Foam, 2.5 x2.5 in 1 x Per Day/30 Days ary Discharge Instructions: Apply to wound bed as instructed Secondary Dressing: Woven Gauze Sponge, Non-Sterile 4x4 in 1 x Per Day/30 Days Discharge Instructions: Apply over primary dressing as directed. Secondary Dressing: ABD Pad, 8x10 1 x Per Day/30 Days Discharge Instructions: T down before applying kerlix. Apply over primary dressing as directed. ape Secured With: [employer], 4.5x3.1 (in/yd) (Generic) 1 x Per Day/30 Days Discharge Instructions: Secure with Kerlix as directed. Secured With: 63M Medipore H Soft Cloth Surgical T 4 x 2 (in/yd) (Generic) 1 x Per Day/30 Days ape Discharge Instructions: Secure dressing with tape as directed. Add-Ons: 1 x Per Day/30 Days #1using cocci to the periwound to prevent damage from drainage to the normal periwound skin #2 s Hydrofera Blue to the wound surface. #3 I may attempt a double layer of Hydrofera Blue if this does not work. #4 I thought the drainage was  sufficient here to look at putting  a total contact cast back on the wound although we will have to be vigilant about this next week because of drainage issues. #5 simply not making enough progress Electronic Signature(s) Signed: 06/29/2020 9:50:14 AM By: [name] MD Entered By: [name] on 06/28/2020 08:02:13 -------------------------------------------------------------------------------- Total Contact Cast Details Patient Name: Date of Service: [name], [name] 06/26/2020 4:00 PM Medical Record Number: [id number] Patient Account Number: [id number] Date of Birth/Sex: Treating RN: September 23, 1985 (35 y.o. [name] Primary Care Provider: [name] Other Clinician: Referring Provider: Treating Provider/Extender: [name] in Treatment: 35 T Contact Cast Applied for Wound Assessment: otal Wound #3 Left,Lateral Foot Performed By: Physician [name], MD Post Procedure Diagnosis Same as Pre-procedure Electronic Signature(s) Signed: 06/29/2020 9:50:14 AM By: [name] MD Previous Signature: 06/26/2020 6:16:52 PM Version By: [name] Entered By: [name] on 06/28/2020 07:55:04 -------------------------------------------------------------------------------- [place] Details Patient Name: Date of Service: [name], [name] 06/26/2020 Medical Record Number: [id number] Patient Account Number: [id number] Date of Birth/Sex: Treating RN: 1985/12/08 (35 y.o. [name] Primary Care Provider: [name] Other Clinician: Referring Provider: Treating Provider/Extender: [name] in Treatment: 35 Diagnosis Coding ICD-10 Codes Code Description E11.621 Type 2 diabetes mellitus with foot ulcer L97.528 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ity Facility Procedures CPT4 Code: [id number] Description: 47841 - DEB SUBQ TISSUE 20 SQ CM/< ICD-10  Diagnosis Description L97.528 Non-pressure chronic ulcer of other part of left foot with other specified seve E11.621 Type 2 diabetes mellitus with foot ulcer Modifier: rity Quantity: 1 Physician Procedures : CPT4 Code Description Modifier [id number] - WC PHYS SUBQ TISS 20 SQ CM ICD-10 Diagnosis Description L97.528 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ity E11.621 Type 2 diabetes mellitus with foot ulcer Quantity: 1 Electronic Signature(s) Signed: 06/29/2020 9:50:14 AM By: [name] MD Previous Signature: 06/26/2020 6:16:52 PM Version By: [name] Entered By: [name] on 06/28/2020 08:02:27

## 2020-06-30 ENCOUNTER — Other Ambulatory Visit: Payer: Self-pay

## 2020-06-30 ENCOUNTER — Encounter (HOSPITAL_BASED_OUTPATIENT_CLINIC_OR_DEPARTMENT_OTHER): Payer: BC Managed Care – PPO | Admitting: Internal Medicine

## 2020-06-30 DIAGNOSIS — E11621 Type 2 diabetes mellitus with foot ulcer: Secondary | ICD-10-CM | POA: Diagnosis not present

## 2020-07-01 NOTE — Progress Notes (Signed)
[place], [name] ([id number]) Visit Report for 06/26/2020 Arrival Information Details Patient Name: Date of Service: [name] D 06/26/2020 4:00 PM Medical Record Number: [id number] Patient Account Number: [id number] Date of Birth/Sex: Treating RN: 1985-11-03 (35 y.o. [name] Primary Care Zacheriah Stumpe: [name] Other Clinician: Referring Romie Keeble: Treating Aarvi Stotts/Extender: [name] in Treatment: 59 Visit Information History Since Last Visit Added or deleted any medications: No Patient Arrived: Ambulatory Any new allergies or adverse reactions: No Arrival Time: 16:26 Had a fall or experienced change in No Accompanied By: alone activities of daily living that may affect Transfer Assistance: None risk of falls: Patient Identification Verified: Yes Signs or symptoms of abuse/neglect since last No Secondary Verification Process Completed: Yes visito Patient Requires Transmission-Based Precautions: No Hospitalized since last visit: No Patient Has Alerts: No Implantable device outside of the clinic No excluding cellular tissue based products placed in the center since last visit: Has Dressing in Place as Prescribed: Yes Has Footwear/Offloading in Place as Yes Prescribed: Left: Removable Cast Walker/Walking Boot Pain Present Now: No Electronic Signature(s) Signed: 06/29/2020 5:28:50 PM By: [name] RN, BSN Entered By: [name] on 06/26/2020 16:27:09 -------------------------------------------------------------------------------- Encounter Discharge Information Details Patient Name: Date of Service: [name], [name] 06/26/2020 4:00 PM Medical Record Number: [id number] Patient Account Number: [id number] Date of Birth/Sex: Treating RN: [birth year] (35 y.o. [name] Primary Care Zane Samson: [name] Other Clinician: Referring Nesa Distel: Treating Jonae Renshaw/Extender: [name] in  Treatment: 35 Encounter Discharge Information Items Post Procedure Vitals Discharge Condition: Stable Temperature (F): 98.5 Ambulatory Status: Ambulatory Pulse (bpm): 99 Discharge Destination: Home Respiratory Rate (breaths/min): 18 Transportation: Private Auto Blood Pressure (mmHg): 150/88 Accompanied By: self Schedule Follow-up Appointment: Yes Clinical Summary of Care: Patient Declined Electronic Signature(s) Signed: 06/26/2020 6:33:47 PM By: Baruch Gouty RN, BSN Entered By: Baruch Gouty on 06/26/2020 18:08:22 -------------------------------------------------------------------------------- Lower Extremity Assessment Details Patient Name: Date of Service: [name], [name] 06/26/2020 4:00 PM Medical Record Number: [id number] Patient Account Number: [id number] Date of Birth/Sex: Treating RN: Sep 16, 1985 (35 y.o. [name] Primary Care Jennie Hannay: [name] Other Clinician: Referring Elman Dettman: Treating Latreshia Beauchaine/Extender: [name] in Treatment: 35 Edema Assessment Assessed: [name]: No] [name]: No] Edema: [Left: Ye] [Right: s] Calf Left: Right: Point of Measurement: 48 cm From Medial Instep 45.5 cm Ankle Left: Right: Point of Measurement: 10 cm From Medial Instep 27.5 cm Vascular Assessment Pulses: Dorsalis Pedis Palpable: [Left:Yes] Electronic Signature(s) Signed: 06/29/2020 5:28:50 PM By: [name] RN, BSN Entered By: [name] on 06/26/2020 16:28:20 -------------------------------------------------------------------------------- Multi Wound Chart Details Patient Name: Date of Service: [name], [name] 06/26/2020 4:00 PM Medical Record Number: [id number] Patient Account Number: [id number] Date of Birth/Sex: Treating RN: [birth year] (35 y.o. [name] Primary Care Hunter Pinkard: [name] Other Clinician: Referring Christophere Hillhouse: Treating Laneah Luft/Extender: [name] in Treatment:  35 Vital Signs Height(in): 77 Capillary Blood Glucose(mg/dl): 139 Weight(lbs): 280 Pulse(bpm): 99 Body Mass Index(BMI): 33 Blood Pressure(mmHg): 150/88 Temperature(F): 98.5 Respiratory Rate(breaths/min): 16 Photos: [3:Left, Lateral Foot] [N/A:N/A N/A] Wound Location: [3:Trauma] [N/A:N/A] Wounding Event: [3:Diabetic Wound/Ulcer of the Lower] [N/A:N/A] Primary Etiology: [3:Extremity Type II Diabetes] [N/A:N/A] Comorbid History: [3:10/02/2019] [N/A:N/A] Date Acquired: [3:35] [N/A:N/A] Weeks of Treatment: [3:Open] [N/A:N/A] Wound Status: [3:2.8x3.4x0.4] [N/A:N/A] Measurements L x W x D (cm) [3:7.477] [N/A:N/A] A (cm) : rea [3:2.991] [N/A:N/A] Volume (cm) : [3:-353.40%] [N/A:N/A] % Reduction in A [3:rea: -1712.70%] [N/A:N/A] % Reduction in Volume: [3:Grade 2] [N/A:N/A] Classification: [  3:Medium] [N/A:N/A] Exudate A mount: [3:Serosanguineous] [N/A:N/A] Exudate Type: [3:red, brown] [N/A:N/A] Exudate Color: [3:Thickened] [N/A:N/A] Wound Margin: [3:Large (67-100%)] [N/A:N/A] Granulation A mount: [3:Red] [N/A:N/A] Granulation Quality: [3:Small (1-33%)] [N/A:N/A] Necrotic A mount: [3:Fat Layer (Subcutaneous Tissue): Yes N/A] Exposed Structures: [3:Fascia: No Tendon: No Muscle: No Joint: No Bone: No Small (1-33%)] [N/A:N/A] Epithelialization: [3:Debridement - Excisional] [N/A:N/A] Debridement: Pre-procedure Verification/Time Out 17:20 [N/A:N/A] Taken: [3:Other] [N/A:N/A] Pain Control: [3:Callus, Subcutaneous] [N/A:N/A] Tissue Debrided: [3:Skin/Subcutaneous Tissue] [N/A:N/A] Level: [3:9.52] [N/A:N/A] Debridement A (sq cm): [3:rea Curette] [N/A:N/A] Instrument: [3:Moderate] [N/A:N/A] Bleeding: [3:Silver Nitrate] [N/A:N/A] Hemostasis A chieved: [3:Procedure was tolerated well] [N/A:N/A] Debridement Treatment Response: [3:2.8x3.4x0.4] [N/A:N/A] Post Debridement Measurements L x W x D (cm) [3:2.991] [N/A:N/A] Post Debridement Volume: (cm) [3:Debridement] [N/A:N/A] Procedures  Performed: [3:T Contact Cast otal] Treatment Notes Wound #3 (Foot) Wound Laterality: Left, Lateral Cleanser Normal Saline Discharge Instruction: Cleanse the wound with Normal Saline prior to applying a clean dressing using gauze sponges, not tissue or cotton balls. Soap and Water Discharge Instruction: May shower and wash wound with dial antibacterial soap and water prior to dressing change. Peri-Wound Care Zinc Oxide Ointment 30g tube Discharge Instruction: Apply Zinc Oxide to periwound with each dressing change as needed. Sween Lotion (Moisturizing lotion) Discharge Instruction: Apply moisturizing lotion as directed Topical Primary Dressing Hydrofera Blue Ready Foam, 2.5 x2.5 in Discharge Instruction: Apply to wound bed as instructed Secondary Dressing Woven Gauze Sponge, Non-Sterile 4x4 in Discharge Instruction: Apply over primary dressing as directed. ABD Pad, 8x10 Discharge Instruction: T down before applying kerlix. Apply over primary dressing as directed. ape Secured With [employer], 4.5x3.1 (in/yd) Discharge Instruction: Secure with Kerlix as directed. 61M Medipore H Soft Cloth Surgical T 4 x 2 (in/yd) ape Discharge Instruction: Secure dressing with tape as directed. Compression Wrap Compression Stockings Add-Ons Electronic Signature(s) Signed: 06/29/2020 9:50:14 AM By: [name] MD Signed: 06/29/2020 5:20:59 PM By: [name] Entered By: [name] on 06/28/2020 07:54:53 -------------------------------------------------------------------------------- Multi-Disciplinary Care Plan Details Patient Name: Date of Service: [name], [name] 06/26/2020 4:00 PM Medical Record Number: [id number] Patient Account Number: [id number] Date of Birth/Sex: Treating RN: 02/26/1986 (35 y.o. [name] Primary Care Lazaria Schaben: [name] Other Clinician: Referring Abigayle Wilinski: Treating Miela Desjardin/Extender: [name] in  Treatment: 6 Multidisciplinary Care Plan reviewed with physician Active Inactive Nutrition Nursing Diagnoses: Imbalanced nutrition Potential for alteratiion in Nutrition/Potential for imbalanced nutrition Goals: Patient/caregiver agrees to and verbalizes understanding of need to use nutritional supplements and/or vitamins as prescribed Date Initiated: 10/24/2019 Date Inactivated: 04/06/2020 Target Resolution Date: 04/03/2020 Goal Status: Met Patient/caregiver will maintain therapeutic glucose control Date Initiated: 10/24/2019 Target Resolution Date: 07/31/2020 Goal Status: Active Interventions: Assess HgA1c results as ordered upon admission and as needed Assess patient nutrition upon admission and as needed per policy Provide education on elevated blood sugars and impact on wound healing Provide education on nutrition Treatment Activities: Education provided on Nutrition : 06/18/2020 Notes: Electronic Signature(s) Signed: 06/26/2020 6:16:52 PM By: [name] Entered By: [name] on 06/26/2020 17:31:59 -------------------------------------------------------------------------------- Pain Assessment Details Patient Name: Date of Service: [name], [name] 06/26/2020 4:00 PM Medical Record Number: [id number] Patient Account Number: [id number] Date of Birth/Sex: Treating RN: [birth year] (35 y.o. [name] Primary Care Rateel Beldin: [name] Other Clinician: Referring Arlow Spiers: Treating Rosely Fernandez/Extender: [name] in Treatment: 35 Active Problems Location of Pain Severity and Description of Pain Patient Has Paino No Site Locations Pain Management and Medication Current Pain Management: Electronic Signature(s) Signed: 06/29/2020 5:28:50  PM By: [name] RN, BSN Entered By: [name] on 06/26/2020 16:28:42 -------------------------------------------------------------------------------- Patient/Caregiver Education  Details Patient Name: Date of Service: [name] D 3/25/2022andnbsp4:00 PM Medical Record Number: [id number] Patient Account Number: [id number] Date of Birth/Gender: Treating RN: 1986/01/21 (35 y.o. [name] Primary Care Physician: [name] Other Clinician: Referring Physician: Treating Physician/Extender: [name] in Treatment: 3 Education Assessment Education Provided To: Patient Education Topics Provided Offloading: Methods: Explain/Verbal, Printed Responses: State content correctly Wound/Skin Impairment: Methods: Explain/Verbal, Printed Responses: State content correctly Electronic Signature(s) Signed: 06/26/2020 6:16:52 PM By: [name] Entered By: [name] on 06/26/2020 17:32:30 -------------------------------------------------------------------------------- Wound Assessment Details Patient Name: Date of Service: [name], [name] 06/26/2020 4:00 PM Medical Record Number: [id number] Patient Account Number: [id number] Date of Birth/Sex: Treating RN: 1985-07-02 (35 y.o. [name] Primary Care Quinta Eimer: [name] Other Clinician: Referring Lorna Strother: Treating Tana Trefry/Extender: [name] in Treatment: 35 Wound Status Wound Number: 3 Primary Etiology: Diabetic Wound/Ulcer of the Lower Extremity Wound Location: Left, Lateral Foot Wound Status: Open Wounding Event: Trauma Comorbid History: Type II Diabetes Date Acquired: 10/02/2019 Weeks Of Treatment: 35 Clustered Wound: No Photos Wound Measurements Length: (cm) 2.8 Width: (cm) 3.4 Depth: (cm) 0.4 Area: (cm) 7.477 Volume: (cm) 2.991 % Reduction in Area: -353.4% % Reduction in Volume: -1712.7% Epithelialization: Small (1-33%) Tunneling: No Undermining: No Wound Description Classification: Grade 2 Wound Margin: Thickened Exudate Amount: Medium Exudate Type: Serosanguineous Exudate Color: red, brown Foul  Odor After Cleansing: No Slough/Fibrino Yes Wound Bed Granulation Amount: Large (67-100%) Exposed Structure Granulation Quality: Red Fascia Exposed: No Necrotic Amount: Small (1-33%) Fat Layer (Subcutaneous Tissue) Exposed: Yes Necrotic Quality: Adherent Slough Tendon Exposed: No Muscle Exposed: No Joint Exposed: No Bone Exposed: No Electronic Signature(s) Signed: 06/29/2020 5:28:50 PM By: [name] RN, BSN Signed: 07/01/2020 9:05:34 AM By: Sandre Kitty Entered By: Sandre Kitty on 06/26/2020 17:33:40 -------------------------------------------------------------------------------- Vitals Details Patient Name: Date of Service: [name], [name] 06/26/2020 4:00 PM Medical Record Number: [id number] Patient Account Number: [id number] Date of Birth/Sex: Treating RN: Aug 15, 1985 (35 y.o. [name] Primary Care Salmaan Patchin: [name] Other Clinician: Referring Tylen Leverich: Treating Dennis Killilea/Extender: [name] in Treatment: 35 Vital Signs Time Taken: 16:27 Temperature (F): 98.5 Height (in): 77 Pulse (bpm): 99 Weight (lbs): 280 Respiratory Rate (breaths/min): 16 Body Mass Index (BMI): 33.2 Blood Pressure (mmHg): 150/88 Capillary Blood Glucose (mg/dl): 139 Reference Range: 80 - 120 mg / dl Notes glucose per pt report Electronic Signature(s) Signed: 06/29/2020 5:28:50 PM By: [name] RN, BSN Entered By: [name] on 06/26/2020 16:27:50

## 2020-07-03 NOTE — Progress Notes (Signed)
[name], Max ([id number]) Visit Report for 06/30/2020 Debridement Details Patient Name: Date of Service: [name] 06/30/2020 4:00 PM Medical Record Number: [id number] Patient Account Number: [id number] Date of Birth/Sex: Treating RN: [birth year] (35 y.o. [name], [name] Primary Care Provider: [name] Other Clinician: Referring Provider: Treating Provider/Extender: [name] in Treatment: 35 Debridement Performed for Assessment: Wound #3 Left,Lateral Foot Performed By: Physician [name], MD Debridement Type: Debridement Severity of Tissue Pre Debridement: Fat layer exposed Level of Consciousness (Pre-procedure): Awake and Alert Pre-procedure Verification/Time Out Yes - 16:29 Taken: Start Time: 16:29 Pain Control: Lidocaine T Area Debrided (L x W): otal 10 (cm) x 1 (cm) = 10 (cm) Tissue and other material debrided: Viable, Non-Viable, Subcutaneous, Skin: Dermis , Skin: Epidermis Level: Skin/Subcutaneous Tissue Debridement Description: Excisional Instrument: Curette Bleeding: Minimum Hemostasis Achieved: Pressure End Time: 16:29 Procedural Pain: 0 Post Procedural Pain: 0 Response to Treatment: Procedure was tolerated well Level of Consciousness (Post- Awake and Alert procedure): Post Debridement Measurements of Total Wound Length: (cm) 3 Width: (cm) 3.1 Depth: (cm) 0.4 Volume: (cm) 2.922 Character of Wound/Ulcer Post Debridement: Improved Severity of Tissue Post Debridement: Fat layer exposed Post Procedure Diagnosis Same as Pre-procedure Electronic Signature(s) Signed: 07/02/2020 7:47:56 AM By: [name] MD Signed: 07/03/2020 5:01:26 PM By: Rhae Hammock RN Previous Signature: 06/30/2020 5:10:40 PM Version By: Rhae Hammock RN Entered By: [name] on 07/01/2020 18:11:44 -------------------------------------------------------------------------------- HPI Details Patient Name: Date of  Service: [name], [name] 06/30/2020 4:00 PM Medical Record Number: [id number] Patient Account Number: [id number] Date of Birth/Sex: Treating RN: 1986/03/10 (35 y.o. 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 He comes in for review of a wound over the plantar fifth metatarsal head extending into the lateral part of the foot. He was followed for this previously by his podiatrist Dr. Cornelius Moras. As the patient tells his story he went to see podiatry first for a swelling he developed on the lateral part of his fifth metatarsal head in May. He states this was "open" by podiatry and the area closed. He was followed up in June and it was again opened callus removed and it closed promptly. There were plans being made for surgery on the fifth metatarsal head in June however his blood sugar was apparently too high for anesthesia. Apparently the area was debrided and opened again in June and it is never closed since. Looking over the records from podiatry I am really not able to follow this. It was clear when he was first seen it was before 5/14 at that point he already had a wound. By 5/17 the ulcer was resolved. I do not see anything about a procedure. On 5/28 noted to have pre-ulcerative moderate keratosis. X-ray noted 1/5 contracted toe and tailor's bunion and metatarsal deformity. On a visit date on 09/28/2018 the dorsal part of the left foot it healed and resolved. There was concern about swelling in his lower extremity he was sent to the ER.. As far as I can tell he was seen in the ER on 7/12 with an ulcer on his left foot. A DVT rule out of the left leg was negative. I do not think I have complete records from podiatry but I  am not able to verify  the procedures this patient states he had. He states after the last procedure the wound has never closed although I am not able to follow this in the records I have from podiatry. He has not had a recent x-ray The patient has been using Neosporin on the wound. He is wearing a Darco shoe. He is still very active up on his foot working and exercising. Past medical history; type 2 diabetes ketosis-prone, leg swelling with a negative DVT study in July. Non-smoker ABI in our clinic was 0.85 on the left 10/16; substantial wound on the plantar left fifth met head extending laterally almost to the dorsal fifth MTP. We have been using silver alginate we gave him a Darco forefoot off loader. An x-ray did not show evidence of osteomyelitis did note soft tissue emphysema which I think was due to gas tracking through an open wound. There is no doubt in my mind he requires an MRI 10/23; MRI not booked until 3 November at the earliest this is largely due to his glucose sensor in the right arm. We have been using silver alginate. There has been an improvement 10/29; I am still not exactly sure when his MRI is booked for. He says it is the third but it is the 10th in epic. This definitely needs to be done. He is running a low-grade fever today but no other symptoms. No real improvement in the 1 02/26/2019 patient presents today for a follow-up visit here in our clinic he is last been seen in the clinic on October 29. Subsequently we were working on getting MRI to evaluate and see what exactly was going on and where we would need to go from the standpoint of whether or not he had osteomyelitis and again what treatments were going be required. Subsequently the patient ended up being admitted to the hospital on 02/07/2019 and was discharged on 02/14/2019. This is a somewhat interesting admission with a discharge diagnosis of pneumonia due to COVID-19 although he was positive for COVID-19  when tested at the urgent care but negative x2 when he was actually in the hospital. With that being said he did have acute respiratory failure with hypoxia and it was noted he also have a left foot ulceration with osteomyelitis. With that being said he did require oxygen for his pneumonia and I level 4 L. He was placed on antivirals and steroids for the COVID-19. He was also transferred to the [facility] at one point. Nonetheless he did subsequently discharged home and since being home has done much better in that regard. The CT angiogram did not show any pulmonary embolism. With regard to the osteomyelitis the patient was placed on vancomycin and Zosyn while in the hospital but has been changed to Augmentin at discharge. It was also recommended that he follow- up with wound care and podiatry. Podiatry however wanted him to see [name] according to the patient prior to them doing anything further. His hemoglobin A1c was 9.9 as noted in the hospital. Have an MRI of the left foot performed while in the hospital on 02/04/2019. 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 There is an overlying plantar open wound noted an abscess tracking back along the lateral aspect of the fifth metatarsal shaft. There is otherwise diffuse cellulitis and mild fasciitis without findings of polymyositis. The patient did have recently pneumonia secondary to COVID-19 I looked in the chart through epic  and it does appear that the patient may need to have an additional x-ray just to ensure everything is cleared and that he has no airspace disease prior to putting him into the [name]. 03/05/2019; patient was readmitted to the clinic last week. He was hospitalized twice for a viral upper respiratory tract infection from 11/1 through 11/4 and then 11/5 through 11/12 ultimately this turned out to be Covid pneumonitis. Although he was  discharged on oxygen he is not using it. He says he feels fine. He has no exercise limitation no cough no sputum. His O2 sat in our clinic today was 100% on room air. 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 He received Vanco and Zosyn in the hospital and then was discharged on 2 weeks of Augmentin. I do not see any relevant cultures. He was supposed to follow-up with infectious disease but I do not see that he has an appointment. 12/8; patient saw Dr. Novella Olive of infectious disease last week. He felt that he had had adequate antibiotic therapy. He did not go to follow-up with Dr. Amalia Hailey of podiatry and I have again talked to him about the pros and cons of this. He does not want to consider a ray amputation of this time. He is aware of the risks of recurrence, migration etc. He started HBO today and tolerated this well. He can complete the Augmentin that I gave him last week. I have looked over the lab work that Dr. Chana Bode ordered his C-reactive protein was 3.3 and his sedimentation rate was 17. The C-reactive protein is never really been measurably that high in this patient 12/15; not much change in the wound today however he has undermining along the lateral part of the foot again more extensively than last week. He has some rims of epithelialization. We have been using silver alginate. He is undergoing hyperbarics but did not dive today 12/18; in for his obligatory first total contact cast change. Unfortunately there was pus coming from the undermining area around his fifth metatarsal head. This was cultured but will preclude reapplication of a cast. He is seen in conjunction with HBO 12/24; patient had staph lugdunensis in the wound in the undermining area laterally last time. We put him on doxycycline which should have covered this. The wound looks better today. I am going to give  him another week of doxycycline before reattempting the total contact cast 12/31; the patient is completing antibiotics. Hemorrhagic debris in the distal part of the wound with some undermining distally. He also had hyper granulation. Extensive debridement with a #5 curette. The infected area that was on the lateral part of the fifth met head is closed over. I do not think he needs any more antibiotics. Patient was seen prior to HBO. Preparations for a total contact cast were made in the cast will be placed post hyperbarics 04/11/19; once again the patient arrives today without complaint. He had been in a cast all week noted that he had heavy drainage this week. This resulted in large raised areas of macerated tissue around the wound 1/14; wound bed looks better slightly smaller. Hydrofera Blue has been changing himself. He had a heavy drainage last week which caused a lot of maceration around the wound so I took him out of a total contact cast he says the drainage is actually better this week He is seen today in conjunction  with HBO 1/21; returns to clinic. He was up in [place] for a day or 2 attending a funeral. He comes back in with the wound larger and with a large area of exposed bone. He had osteomyelitis and septic arthritis of the fifth left metatarsal head while he was in hospital. He received IV antibiotics in the hospital for a prolonged period of time then 3 weeks of Augmentin. Subsequently I gave him 2 weeks of doxycycline for more superficial wound infection. When I saw this last week the wound was smaller the surface of the wound looks satisfactory. 1/28; patient missed hyperbarics today. Bone biopsy I did last time showed Enterococcus faecalis and Staphylococcus lugdunensis . He has a wide area of exposed bone. We are going to use silver alginate as of today. I had another ethical discussion with the patient. 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  In this situation I think the likelihood of healing this is low. Therefore I have recommended a ray amputation and with the patient's agreement I have referred him to Dr. Doran Durand. The other issue is that his compliance with hyperbarics has been minimal because of his work schedule and given his underlying decision I am going to stop this today READMISSION 10/24/2019 MRI 09/29/2019 left foot IMPRESSION: 1. Apparent skin ulceration inferior and lateral to the 5th metatarsal base with underlying heterogeneous T2 signal and enhancement in the subcutaneous fat. Small peripherally enhancing fluid collections along the plantar and lateral aspects of the 5th metatarsal base suspicious for abscesses. 2. Interval amputation through the mid 5th metatarsal with nonspecific low-level marrow edema and enhancement. Given the proximity to the adjacent soft tissue inflammatory changes, osteomyelitis cannot be excluded. 3. The additional bones appear unremarkable. MRI 09/29/2019 right foot IMPRESSION: 1. Soft tissue ulceration lateral to the 5th MTP joint. There is low-level T2 hyperintensity within the 4th and 5th metatarsal heads and adjacent proximal phalanges without abnormal T1 signal or cortical destruction. These findings are nonspecific and could be seen with early marrow edema, hyperemia or early osteomyelitis. No evidence of septic joint. 2. Mild tenosynovitis and synovial enhancement associated with the extensor digitorum tendons at the level of the midfoot. 3. Diffuse low-level muscular T2 hyperintensity and enhancement, most consistent with diabetic myopathy. LEFT FOOT BONE Methicillin resistant staphylococcus aureus Staphylococcus lugdunensis MIC MIC CIPROFLOXACIN >=8 RESISTANT Resistant <=0.5 SENSI... Sensitive CLINDAMYCIN <=0.25 SENS... Sensitive >=8 RESISTANT Resistant ERYTHROMYCIN >=8 RESISTANT Resistant >=8 RESISTANT Resistant GENTAMICIN <=0.5 SENSI... Sensitive <=0.5 SENSI...  Sensitive Inducible Clindamycin NEGATIVE Sensitive NEGATIVE Sensitive OXACILLIN >=4 RESISTANT Resistant 2 SENSITIVE Sensitive RIFAMPIN <=0.5 SENSI... Sensitive <=0.5 SENSI... Sensitive TETRACYCLINE <=1 SENSITIVE Sensitive <=1 SENSITIVE Sensitive TRIMETH/SULFA <=10 SENSIT Sensitive <=10 SENSIT Sensitive ... Marland Kitchen.. VANCOMYCIN 1 SENSITIVE Sensitive <=0.5 SENSI... Sensitive Right foot bone . Component 3 wk ago Specimen Description BONE Special Requests RIGHT 4 METATARSAL SAMPLE B Gram Stain NO WBC SEEN NO ORGANISMS SEEN Culture RARE METHICILLIN RESISTANT STAPHYLOCOCCUS AUREUS NO ANAEROBES ISOLATED Performed at [facility], [place] [address] [place], [place] [address] Report Status 10/08/2019 FINAL Organism ID, Bacteria METHICILLIN RESISTANT STAPHYLOCOCCUS AUREUS Resulting Agency [facility] Susceptibility Methicillin resistant staphylococcus aureus MIC CIPROFLOXACIN >=8 RESISTANT Resistant CLINDAMYCIN <=0.25 SENS... Sensitive ERYTHROMYCIN >=8 RESISTANT Resistant GENTAMICIN <=0.5 SENSI... Sensitive Inducible Clindamycin NEGATIVE Sensitive OXACILLIN >=4 RESISTANT Resistant RIFAMPIN <=0.5 SENSI... Sensitive TETRACYCLINE <=1 SENSITIVE Sensitive TRIMETH/SULFA <=10 SENSIT Sensitive ... VANCOMYCIN 1 SENSITIVE Sensitive This is a patient we had in clinic earlier this year with a wound over his  left fifth metatarsal head. 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 Doran Durand on 05/16/2019. He seems to have developed problems with wounds on his bilateral feet in June 2021 although he may have had problems earlier than this. He was in an urgent care with a right foot ulcer on 09/26/2019 and given a course of  doxycycline. This was apparently after having trouble getting into see orthopedics. He was seen by podiatry on 09/28/2019 noted to have bilateral lower extremity ulcers including the left lateral fifth metatarsal base and the right subfifth met head. It was noted that had purulent drainage at that time. He required hospitalization from 6/20 through 7/2. This was because of worsening right foot wounds. He underwent bilateral operative incision and drainage and bone biopsies bilaterally. Culture results are listed above. He has been referred back to clinic by Dr. Jacqualyn Posey of podiatry. He is also followed by Dr. Megan Salon who saw him yesterday. He was discharged from hospital on Zyvox Flagyl and Levaquin and yesterday changed to doxycycline Flagyl and Levaquin. His inflammatory markers on 6/26 showed a sedimentation rate of 129 and a C-reactive protein of 5. This is improved to 14 and 1.3 respectively. This would indicate improvement. ABIs in our clinic today were 1.23 on the right and 1.20 on the left 11/01/2019 on evaluation today patient appears to be doing fairly well in regard to the wounds on his feet at this point. Fortunately there is no signs of active infection at this time. No fevers, chills, nausea, vomiting, or diarrhea. He currently is seeing infectious disease and still under their care at this point. Subsequently he also has both wounds which she has not been using collagen on as he did not receive that in his packaging he did not call us and let us know that. Apparently that just was missed on the order. Nonetheless we will get that straightened out today. 8/9-Patient returns for bilateral foot wounds, using Prisma with hydrogel moistened dressings, and the wounds appear stable. Patient using surgical shoes, avoiding much pressure or weightbearing as much as possible 8/16; patient has bilateral foot wounds. 1 on the right lateral foot proximally the other is on the left mid lateral foot. Both  required debridement of callus and thick skin around the wounds. We have been using silver collagen 8/27; patient has bilateral lateral foot wounds. The area on the left substantially surrounded by callus and dry skin. This was removed from the wound edge. The underlying wound is small. The area on the right measured somewhat smaller today. 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 Unfortunately I did not update his antibiotics during today's visit. 9/10 I reviewed Dr. Hale Bogus last notes he felt he had completed antibiotics his inflammatory markers were reasonably well controlled. He has a small wound on the lateral left foot and a tiny area on the right which is just above closed. He is using Hydrofera Blue with border foam he has bilateral surgical shoes 9/24; 2 week f/u. doing well. right foot is closed. left foot still undermined. 10/14; right foot remains closed at the fifth met head. The area over the base of the left fifth metatarsal has a small open area but  considerable undermining towards the plantar foot. Thick callus skin around this suggests an adequate pressure relief. We have talked about this. He says he is going to go back into his cam boot. I suggested a total contact cast he did not seem enamored with this suggestion 10/26; left foot base of the fifth metatarsal. Same condition as last time. He has skin over the area with an open wound however the skin is not adherent. He went to see Dr. Earleen Newport who did an x-ray and culture of his foot I have not reviewed the x-ray but the patient was not told anything. He is on doxycycline 11/11; since the patient was last here he was in the emergency room on 10/30 he was concerned about swelling in the left foot. They did not do any cultures or x-rays. They changed his antibiotics to cephalexin. Previous culture showed group B strep. The cephalexin is appropriate as doxycycline  has less than predictable coverage. Arrives in clinic today with swelling over this area under the wound. He also has a new wound on the right fifth metatarsal head 11/18; the patient has a difficult wound on the lateral aspect of the left fifth metatarsal head. The wound was almost ballotable last week I opened it slightly expecting to see purulence however there was just bleeding. I cultured this this was negative. X-ray unchanged. We are trying to get an MRI but I am not sure were going to be able to get this through his insurance. He also has an area on the right lateral fifth metatarsal head this looks healthier 12/3; the patient finally got our MRI. Surprisingly this did not show osteomyelitis. I did show the soft tissue ulceration at the lateral plantar aspect of the fifth metatarsal base with a tiny residual 6 mm abscess overlying the superficial fascia I have tried to culture this area I have not been able to get this to grow anything. Nevertheless the protruding tissue looks aggravated. I suspect we should try to treat the underlying "abscess with broad-spectrum antibiotics. I am going to start him on Levaquin and Flagyl. He has much less edema in his legs and I am going to continue to wrap his legs and see him weekly 12/10. I started Levaquin and Flagyl on him last week. He just picked up the Flagyl apparently there was some delay. The worry is the wound on the left fifth metatarsal base which is substantial and worsening. His foot looks like he inverts at the ankle making this a weightbearing surface. Certainly no improvement in fact I think the measurements of this are somewhat worse. We have been using 12/17; he apparently just got the Levaquin yesterday this is 2 weeks after the fact. He has completed the Flagyl. The area over the left fifth metatarsal base still has protruding granulation tissue although it does not look quite as bad as it did some weeks ago. He has severe bilateral  lymphedema although we have not been treating him for wounds on his legs this is definitely going to require compression. There was so much edema in the left I did not wish to put him in a total contact cast today. I am going to increase his compression from 3-4 layer. The area on the right lateral fifth met head actually look quite good and superficial. 12/23; patient arrived with callus on the right fifth met head and the substantial hyper granulated callused wound on the base of his fifth metatarsal. He says he is completing his Levaquin  in 2 days but I do not think that adds up with what I gave him but I will have to double check this. We are using Hydrofera Blue on both areas. My plan is to put the left leg in a cast the week after New Year's 04/06/2020; patient's wounds about the same. Right lateral fifth metatarsal head and left lateral foot over the base of the fifth metatarsal. There is undermining on the left lateral foot which I removed before application of total contact cast continuing with Hydrofera Blue new. Patient tells me he was seen by endocrinology today lab work was done [Dr. Kerr]. Also wondering whether he was referred to cardiology. I went over some lab work from previously does not have chronic renal failure certainly not nephrotic range proteinuria he does have very poorly controlled diabetes but this is not his most updated lab work. Hemoglobin A1c has been over 11 1/10; the patient had a considerable amount of leakage towards mid part of his left foot with macerated skin however the wound surface looks better the area on the right lateral fifth met head is better as well. I am going to change the dressing on the left foot under the total contact cast to silver alginate, continue with Hydrofera Blue on the right. 1/20; patient was in the total contact cast for 10 days. Considerable amount of drainage although the skin around the wound does not look too bad on the left foot.  The area on the right fifth metatarsal head is closed. Our nursing staff reports large amount of drainage out of the left lateral foot wound 1/25; continues with copious amounts of drainage described by our intake staff. PCR culture I did last week showed E. coli and Enterococcus faecalis and low quantities. Multiple resistance genes documented including extended spectrum beta lactamase, MRSA, MRSE, quinolone, tetracycline. The wound is not quite as good this week as it was 5 days ago but about the same size 2/3; continues with copious amounts of malodorous drainage per our intake nurse. The PCR culture I did 2 weeks ago showed E. coli and low quantities of Enterococcus. There were multiple resistance genes detected. I put Neosporin on him last week although this does not seem to have helped. The wound is slightly deeper today. Offloading continues to be an issue here although with the amount of drainage she has a total contact cast is just not going to work 2/10; moderate amount of drainage. Patient reports he cannot get his stocking on over the dressing. I told him we have to do that the nurse gave him suggestions on how to make this work. The wound is on the bottom and lateral part of his left foot. Is cultured predominantly grew low amounts of Enterococcus, E. coli and anaerobes. There were multiple resistance genes detected including extended spectrum beta lactamase, quinolone, tetracycline. I could not think of an easy oral combination to address this so for now I am going to do topical antibiotics provided by [facility] I think the main agents here are vancomycin and an aminoglycoside. We have to be able to give him access to the wounds to get the topical antibiotic on 2/17; moderate amount of drainage this is unchanged. He has his Keystone topical antibiotic against the deep tissue culture organisms. He has been using this and changing the dressing daily. Silver alginate on the wound  surface. 2/24; using Keystone antibiotic with silver alginate on the top. He had too much drainage for a total contact cast  at one point although I think that is improving and I think in the next week or 2 it might be possible to replace a total contact cast I did not do this today. In general the wound surface looks healthy however he continues to have thick rims of skin and subcutaneous tissue around the wide area of the circumference which I debrided 06/04/2020 upon evaluation today patient appears to be doing well in regard to his wound. I do feel like he is showing signs of improvement. There is little bit of callus and dead tissue around the edges of the wound as well as what appears to be a little bit of a sinus tract that is off to the side laterally I would perform debridement to clear that away today. 3/17; left lateral foot. The wound looks about the same as I remember. Not much depth surface looks healthy. No evidence of infection 3/25; left lateral foot. Wound surface looks about the same. Separating epithelium from the circumference. There really is no evidence of infection here however not making progress by my view 3/29; left lateral foot. Surface of the wound again looks reasonably healthy still thick skin and subcutaneous tissue around the wound margins. There is no evidence of infection. One of the concerns being brought up by the nurses has again the amount of drainage vis--vis continued use of a total contact cast Electronic Signature(s) Signed: 07/02/2020 7:47:56 AM By: [name] MD Entered By: [name] on 07/01/2020 18:12:58 -------------------------------------------------------------------------------- Physical Exam Details Patient Name: Date of Service: [name], [name] 06/30/2020 4:00 PM Medical Record Number: [id number] Patient Account Number: [id number] Date of Birth/Sex: Treating RN: May 05, 1985 (35 y.o. [name] Primary Care Provider: [name] Other Clinician: Referring Provider: Treating Provider/Extender: [name] in Treatment: 35 Constitutional Sitting or standing Blood Pressure is within target range for patient.. Pulse regular and within target range for patient.Marland Kitchen Respirations regular, non-labored and within target range.. Temperature is normal and within the target range for the patient.Marland Kitchen Appears in no distress. Notes Wound exam; no major changes. Some maceration around the wound edges but not much. The big problem here is thick skin and subcutaneous tissue around the wound circumference sited an aggressive debridement today with a #5 curet hemostasis with silver nitrate and direct pressure we then replaced the total contact cast still using Hydrofera Blue Electronic Signature(s) Signed: 07/02/2020 7:47:56 AM By: [name] MD Entered By: [name] on 07/01/2020 18:14:24 -------------------------------------------------------------------------------- Physician Orders Details Patient Name: Date of Service: [name], [name] 06/30/2020 4:00 PM Medical Record Number: [id number] Patient Account Number: [id number] Date of Birth/Sex: Treating RN: [birth year] (35 y.o. [name] Primary Care Provider: [name] Other Clinician: Referring Provider: Treating Provider/Extender: [name] in Treatment: 15 Verbal / Phone Orders: No Diagnosis Coding Follow-up Appointments Return Appointment in 1 week. Cellular or Tissue Based Products Cellular or Tissue Based Product Type: - Dermagraft approved but has 20% copay. Hold off for now per Dr. Dellia Nims. Bathing/ Shower/ Hygiene May shower and wash wound with soap and water. Edema Control - Lymphedema / SCD / Other Bilateral Lower Extremities Elevate legs to the level of the heart or above for 30 minutes daily and/or when sitting, a frequency of: - throughout the day Avoid standing for long periods of  time. Exercise regularly Moisturize legs daily. - both legs every night before bed. Compression stocking or Garment 20-30 mm/Hg pressure to: - Apply to right leg  in the morning and remove at night. Off-Loading Total Contact Cast to Left Lower Extremity Wound Treatment Wound #3 - Foot Wound Laterality: Left, Lateral Cleanser: Normal Saline (Generic) 1 x Per Day/30 Days Discharge Instructions: Cleanse the wound with Normal Saline prior to applying a clean dressing using gauze sponges, not tissue or cotton balls. Cleanser: Soap and Water 1 x Per Day/30 Days Discharge Instructions: May shower and wash wound with dial antibacterial soap and water prior to dressing change. Peri-Wound Care: Zinc Oxide Ointment 30g tube 1 x Per Day/30 Days Discharge Instructions: Apply Zinc Oxide to periwound with each dressing change as needed. Peri-Wound Care: Sween Lotion (Moisturizing lotion) 1 x Per Day/30 Days Discharge Instructions: Apply moisturizing lotion as directed Topical: 1 x Per Day/30 Days Prim Dressing: Hydrofera Blue Ready Foam, 2.5 x2.5 in 1 x Per Day/30 Days ary Discharge Instructions: Apply to wound bed as instructed Secondary Dressing: Woven Gauze Sponge, Non-Sterile 4x4 in 1 x Per Day/30 Days Discharge Instructions: Apply over primary dressing as directed. Secondary Dressing: ABD Pad, 8x10 1 x Per Day/30 Days Discharge Instructions: T down before applying kerlix. Apply over primary dressing as directed. ape Secondary Dressing: Zetuvit Plus 4x8 in 1 x Per Day/30 Days Discharge Instructions: Apply over primary dressing as directed. Secured With: [employer], 4.5x3.1 (in/yd) (Generic) 1 x Per Day/30 Days Discharge Instructions: Secure with Kerlix as directed. Secured With: 61M Medipore H Soft Cloth Surgical T 4 x 2 (in/yd) (Generic) 1 x Per Day/30 Days ape Discharge Instructions: Secure dressing with tape as directed. Add-Ons: 1 x Per Day/30 Days Electronic Signature(s) Signed:  06/30/2020 5:10:40 PM By: Rhae Hammock RN Signed: 07/02/2020 7:47:56 AM By: [name] MD Entered By: Rhae Hammock on 06/30/2020 16:28:51 -------------------------------------------------------------------------------- Problem List Details Patient Name: Date of Service: [name], [name] 06/30/2020 4:00 PM Medical Record Number: [id number] Patient Account Number: [id number] Date of Birth/Sex: Treating RN: December 28, 1985 (35 y.o. 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, right ankle and foot 10/24/2019 10/24/2019 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, left ankle and foot 10/24/2019 10/24/2019 B95.62 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 10/24/2019 10/24/2019 classified elsewhere Resolved Problems Electronic Signature(s) Signed: 07/02/2020 7:47:56 AM By: [name] MD Entered By: [name] on 07/01/2020 18:10:46 -------------------------------------------------------------------------------- Progress Note Details Patient Name: Date of Service: [name], [name] 06/30/2020 4:00 PM Medical Record Number: [id number] Patient Account Number: [id number] Date of Birth/Sex: Treating RN: 11-03-1985 (35 y.o. 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 He comes in for review of a wound over the plantar fifth metatarsal head extending into the lateral part of the foot. He was followed for this previously by his podiatrist Dr. Cornelius Moras. As the patient tells his story he went to see podiatry first for a swelling he developed  on the lateral part of his fifth metatarsal head in May. He states this was "open" by podiatry and the area closed. He was followed up in June and it was again opened callus removed and it closed promptly. There were plans being made for surgery on the fifth metatarsal head in June however his blood sugar was apparently too high for anesthesia. Apparently the area was debrided and opened again in June and it is never closed since. Looking over the records from podiatry I am really not able to follow this. It was clear when he was first seen it was before 5/14 at that point he already had a wound. By 5/17 the ulcer was resolved. I do not see anything about a procedure. On 5/28 noted to have pre-ulcerative moderate keratosis. X-ray noted 1/5 contracted toe and tailor's bunion and metatarsal deformity. On a visit date on 09/28/2018 the dorsal part of the left foot it healed and resolved. There was concern about swelling in his lower extremity he was sent to the ER.. As far as I can tell he was seen in the ER on 7/12 with an ulcer on his left foot. A DVT rule out of the left leg was negative. I do not think I have complete records from podiatry but I am not able to verify the procedures this patient states he had. He states after the last procedure the wound has never closed although I am not able to follow this in the records I have from podiatry. He has not had a recent x-ray The patient has been using Neosporin on  the wound. He is wearing a Darco shoe. He is still very active up on his foot working and exercising. Past medical history; type 2 diabetes ketosis-prone, leg swelling with a negative DVT study in July. Non-smoker ABI in our clinic was 0.85 on the left 10/16; substantial wound on the plantar left fifth met head extending laterally almost to the dorsal fifth MTP. We have been using silver alginate we gave him a Darco forefoot off loader. An x-ray did not show evidence of osteomyelitis did note soft tissue emphysema which I think was due to gas tracking through an open wound. There is no doubt in my mind he requires an MRI 10/23; MRI not booked until 3 November at the earliest this is largely due to his glucose sensor in the right arm. We have been using silver alginate. There has been an improvement 10/29; I am still not exactly sure when his MRI is booked for. He says it is the third but it is the 10th in epic. This definitely needs to be done. He is running a low-grade fever today but no other symptoms. No real improvement in the 1 02/26/2019 patient presents today for a follow-up visit here in our clinic he is last been seen in the clinic on October 29. Subsequently we were working on getting MRI to evaluate and see what exactly was going on and where we would need to go from the standpoint of whether or not he had osteomyelitis and again what treatments were going be required. Subsequently the patient ended up being admitted to the hospital on 02/07/2019 and was discharged on 02/14/2019. This is a somewhat interesting admission with a discharge diagnosis of pneumonia due to COVID-19 although he was positive for COVID-19 when tested at the urgent care but negative x2 when he was actually in the hospital. With that being said he did have acute respiratory failure with hypoxia  and it was noted he also have a left foot ulceration with osteomyelitis. With that being said he did require oxygen for his  pneumonia and I level 4 L. He was placed on antivirals and steroids for the COVID-19. He was also transferred to the [facility] at one point. Nonetheless he did subsequently discharged home and since being home has done much better in that regard. The CT angiogram did not show any pulmonary embolism. With regard to the osteomyelitis the patient was placed on vancomycin and Zosyn while in the hospital but has been changed to Augmentin at discharge. It was also recommended that he follow- up with wound care and podiatry. Podiatry however wanted him to see [name] according to the patient prior to them doing anything further. His hemoglobin A1c was 9.9 as noted in the hospital. Have an MRI of the left foot performed while in the hospital on 02/04/2019. 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 There is an overlying plantar open wound noted an abscess tracking back along the lateral aspect of the fifth metatarsal shaft. There is otherwise diffuse cellulitis and mild fasciitis without findings of polymyositis. The patient did have recently pneumonia secondary to COVID-19 I looked in the chart through epic and it does appear that the patient may need to have an additional x-ray just to ensure everything is cleared and that he has no airspace disease prior to putting him into the [name]. 03/05/2019; patient was readmitted to the clinic last week. He was hospitalized twice for a viral upper respiratory tract infection from 11/1 through 11/4 and then 11/5 through 11/12 ultimately this turned out to be Covid pneumonitis. Although he was discharged on oxygen he is not using it. He says he feels fine. He has no exercise limitation no cough no sputum. His O2 sat in our clinic today was 100% on room air. He did manage to have his MRI which showed septic arthritis at the fifth MTP joint and osteomyelitis involving the  fifth metatarsal head and proximal phalanx. He received Vanco and Zosyn in the hospital and then was discharged on 2 weeks of Augmentin. I do not see any relevant cultures. He was supposed to follow-up with infectious disease but I do not see that he has an appointment. 12/8; patient saw Dr. Novella Olive of infectious disease last week. He felt that he had had adequate antibiotic therapy. He did not go to follow-up with Dr. Amalia Hailey of podiatry and I have again talked to him about the pros and cons of this. He does not want to consider a ray amputation of this time. He is aware of the risks of recurrence, migration etc. He started HBO today and tolerated this well. He can complete the Augmentin that I gave him last week. I have looked over the lab work that Dr. Chana Bode ordered his C-reactive protein was 3.3 and his sedimentation rate was 17. The C-reactive protein is never really been measurably that high in this patient 12/15; not much change in the wound today however he has undermining along the lateral part of the foot again more extensively than last week. He has some rims of epithelialization. We have been using silver alginate. He is undergoing hyperbarics but did not dive today 12/18; in for his obligatory first total contact cast change. Unfortunately there was pus coming from the undermining area around his fifth metatarsal head. This was cultured but will preclude  reapplication of a cast. He is seen in conjunction with HBO 12/24; patient had staph lugdunensis in the wound in the undermining area laterally last time. We put him on doxycycline which should have covered this. The wound looks better today. I am going to give him another week of doxycycline before reattempting the total contact cast 12/31; the patient is completing antibiotics. Hemorrhagic debris in the distal part of the wound with some undermining distally. He also had hyper granulation. Extensive debridement with a #5 curette. The  infected area that was on the lateral part of the fifth met head is closed over. I do not think he needs any more antibiotics. Patient was seen prior to HBO. Preparations for a total contact cast were made in the cast will be placed post hyperbarics 04/11/19; once again the patient arrives today without complaint. He had been in a cast all week noted that he had heavy drainage this week. This resulted in large raised areas of macerated tissue around the wound 1/14; wound bed looks better slightly smaller. Hydrofera Blue has been changing himself. He had a heavy drainage last week which caused a lot of maceration around the wound so I took him out of a total contact cast he says the drainage is actually better this week He is seen today in conjunction with HBO 1/21; returns to clinic. He was up in [place] for a day or 2 attending a funeral. He comes back in with the wound larger and with a large area of exposed bone. He had osteomyelitis and septic arthritis of the fifth left metatarsal head while he was in hospital. He received IV antibiotics in the hospital for a prolonged period of time then 3 weeks of Augmentin. Subsequently I gave him 2 weeks of doxycycline for more superficial wound infection. When I saw this last week the wound was smaller the surface of the wound looks satisfactory. 1/28; patient missed hyperbarics today. Bone biopsy I did last time showed Enterococcus faecalis and Staphylococcus lugdunensis . He has a wide area of exposed bone. We are going to use silver alginate as of today. I had another ethical discussion with the patient. 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 In this situation I think the likelihood of healing this is low. Therefore I have recommended a ray amputation and with the patient's agreement I have referred him to Dr. Doran Durand. The other issue is that his compliance with hyperbarics has been minimal because of his work schedule  and given his underlying decision I am going to stop this today READMISSION 10/24/2019 MRI 09/29/2019 left foot IMPRESSION: 1. Apparent skin ulceration inferior and lateral to the 5th metatarsal base with underlying heterogeneous T2 signal and enhancement in the subcutaneous fat. Small peripherally enhancing fluid collections along the plantar and lateral aspects of the 5th metatarsal base suspicious for abscesses. 2. Interval amputation through the mid 5th metatarsal with nonspecific low-level marrow edema and enhancement. Given the proximity to the adjacent soft tissue inflammatory changes, osteomyelitis cannot be excluded. 3. The additional bones appear unremarkable. MRI 09/29/2019 right foot IMPRESSION: 1. Soft tissue ulceration lateral to the 5th MTP joint. There is low-level T2 hyperintensity within the 4th and 5th metatarsal heads and adjacent proximal phalanges without abnormal T1 signal or cortical destruction. These findings are nonspecific and could be seen with early marrow edema, hyperemia or early osteomyelitis. No evidence of septic joint. 2. Mild tenosynovitis and synovial enhancement associated with the extensor digitorum tendons at the  level of the midfoot. 3. Diffuse low-level muscular T2 hyperintensity and enhancement, most consistent with diabetic myopathy. LEFT FOOT BONE Methicillin resistant staphylococcus aureus Staphylococcus lugdunensis MIC MIC CIPROFLOXACIN >=8 RESISTANT Resistant <=0.5 SENSI... Sensitive CLINDAMYCIN <=0.25 SENS... Sensitive >=8 RESISTANT Resistant ERYTHROMYCIN >=8 RESISTANT Resistant >=8 RESISTANT Resistant GENTAMICIN <=0.5 SENSI... Sensitive <=0.5 SENSI... Sensitive Inducible Clindamycin NEGATIVE Sensitive NEGATIVE Sensitive OXACILLIN >=4 RESISTANT Resistant 2 SENSITIVE Sensitive RIFAMPIN <=0.5 SENSI... Sensitive <=0.5 SENSI... Sensitive TETRACYCLINE <=1 SENSITIVE Sensitive <=1 SENSITIVE Sensitive TRIMETH/SULFA <=10 SENSIT Sensitive  <=10 SENSIT Sensitive ... Marland Kitchen.. VANCOMYCIN 1 SENSITIVE Sensitive <=0.5 SENSI... Sensitive Right foot bone . Component 3 wk ago Specimen Description BONE Special Requests RIGHT 4 METATARSAL SAMPLE B Gram Stain NO WBC SEEN NO ORGANISMS SEEN Culture RARE METHICILLIN RESISTANT STAPHYLOCOCCUS AUREUS NO ANAEROBES ISOLATED Performed at [facility], [place] [address], [place], [place] [address] Report Status 10/08/2019 FINAL Organism ID, Bacteria METHICILLIN RESISTANT STAPHYLOCOCCUS AUREUS Resulting Agency [facility] Susceptibility Methicillin resistant staphylococcus aureus MIC CIPROFLOXACIN >=8 RESISTANT Resistant CLINDAMYCIN <=0.25 SENS... Sensitive ERYTHROMYCIN >=8 RESISTANT Resistant GENTAMICIN <=0.5 SENSI... Sensitive Inducible Clindamycin NEGATIVE Sensitive OXACILLIN >=4 RESISTANT Resistant RIFAMPIN <=0.5 SENSI... Sensitive TETRACYCLINE <=1 SENSITIVE Sensitive TRIMETH/SULFA <=10 SENSIT Sensitive ... VANCOMYCIN 1 SENSITIVE Sensitive This is a patient we had in clinic earlier this year with a wound over his left fifth metatarsal head. 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 Doran Durand on 05/16/2019. He seems to have developed problems with wounds on his bilateral feet in June 2021 although he may have had problems earlier than this. He was in an urgent care with a right foot ulcer on 09/26/2019 and given a course of doxycycline. This was apparently after having trouble getting into see orthopedics. He was seen by podiatry on 09/28/2019 noted to have bilateral lower extremity ulcers including the left lateral fifth metatarsal base and the right subfifth met head. It was noted that had purulent drainage at  that time. He required hospitalization from 6/20 through 7/2. This was because of worsening right foot wounds. He underwent bilateral operative incision and drainage and bone biopsies bilaterally. Culture results are listed above. He has been referred back to clinic by Dr. Jacqualyn Posey of podiatry. He is also followed by Dr. Megan Salon who saw him yesterday. He was discharged from hospital on Zyvox Flagyl and Levaquin and yesterday changed to doxycycline Flagyl and Levaquin. His inflammatory markers on 6/26 showed a sedimentation rate of 129 and a C-reactive protein of 5. This is improved to 14 and 1.3 respectively. This would indicate improvement. ABIs in our clinic today were 1.23 on the right and 1.20 on the left 11/01/2019 on evaluation today patient appears to be doing fairly well in regard to the wounds on his feet at this point. Fortunately there is no signs of active infection at this time. No fevers, chills, nausea, vomiting, or diarrhea. He currently is seeing infectious disease and still under their care at this point. Subsequently he also has both wounds which she has not been using collagen on as he did not receive that in his packaging he did not call us and let us know that. Apparently that just was missed on the order. Nonetheless we will get that straightened out today. 8/9-Patient returns for bilateral foot  wounds, using Prisma with hydrogel moistened dressings, and the wounds appear stable. Patient using surgical shoes, avoiding much pressure or weightbearing as much as possible 8/16; patient has bilateral foot wounds. 1 on the right lateral foot proximally the other is on the left mid lateral foot. Both required debridement of callus and thick skin around the wounds. We have been using silver collagen 8/27; patient has bilateral lateral foot wounds. The area on the left substantially surrounded by callus and dry skin. This was removed from the wound edge. The underlying wound is small. The  area on the right measured somewhat smaller today. 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 Unfortunately I did not update his antibiotics during today's visit. 9/10 I reviewed Dr. Hale Bogus last notes he felt he had completed antibiotics his inflammatory markers were reasonably well controlled. He has a small wound on the lateral left foot and a tiny area on the right which is just above closed. He is using Hydrofera Blue with border foam he has bilateral surgical shoes 9/24; 2 week f/u. doing well. right foot is closed. left foot still undermined. 10/14; right foot remains closed at the fifth met head. The area over the base of the left fifth metatarsal has a small open area but considerable undermining towards the plantar foot. Thick callus skin around this suggests an adequate pressure relief. We have talked about this. He says he is going to go back into his cam boot. I suggested a total contact cast he did not seem enamored with this suggestion 10/26; left foot base of the fifth metatarsal. Same condition as last time. He has skin over the area with an open wound however the skin is not adherent. He went to see Dr. Earleen Newport who did an x-ray and culture of his foot I have not reviewed the x-ray but the patient was not told anything. He is on doxycycline 11/11; since the patient was last here he was in the emergency room on 10/30 he was concerned about swelling in the left foot. They did not do any cultures or x-rays. They changed his antibiotics to cephalexin. Previous culture showed group B strep. The cephalexin is appropriate as doxycycline has less than predictable coverage. Arrives in clinic today with swelling over this area under the wound. He also has a new wound on the right fifth metatarsal head 11/18; the patient has a difficult wound on the lateral aspect of the left fifth metatarsal head. The wound was almost  ballotable last week I opened it slightly expecting to see purulence however there was just bleeding. I cultured this this was negative. X-ray unchanged. We are trying to get an MRI but I am not sure were going to be able to get this through his insurance. He also has an area on the right lateral fifth metatarsal head this looks healthier 12/3; the patient finally got our MRI. Surprisingly this did not show osteomyelitis. I did show the soft tissue ulceration at the lateral plantar aspect of the fifth metatarsal base with a tiny residual 6 mm abscess overlying the superficial fascia I have tried to culture this area I have not been able to get this to grow anything. Nevertheless the protruding tissue looks aggravated. I suspect we should try to treat the underlying "abscess with broad-spectrum antibiotics. I am going to start him on Levaquin and Flagyl. He has much less edema in his legs and I am going to continue  to wrap his legs and see him weekly 12/10. I started Levaquin and Flagyl on him last week. He just picked up the Flagyl apparently there was some delay. The worry is the wound on the left fifth metatarsal base which is substantial and worsening. His foot looks like he inverts at the ankle making this a weightbearing surface. Certainly no improvement in fact I think the measurements of this are somewhat worse. We have been using 12/17; he apparently just got the Levaquin yesterday this is 2 weeks after the fact. He has completed the Flagyl. The area over the left fifth metatarsal base still has protruding granulation tissue although it does not look quite as bad as it did some weeks ago. He has severe bilateral lymphedema although we have not been treating him for wounds on his legs this is definitely going to require compression. There was so much edema in the left I did not wish to put him in a total contact cast today. I am going to increase his compression from 3-4 layer. The area on the  right lateral fifth met head actually look quite good and superficial. 12/23; patient arrived with callus on the right fifth met head and the substantial hyper granulated callused wound on the base of his fifth metatarsal. He says he is completing his Levaquin in 2 days but I do not think that adds up with what I gave him but I will have to double check this. We are using Hydrofera Blue on both areas. My plan is to put the left leg in a cast the week after New Year's 04/06/2020; patient's wounds about the same. Right lateral fifth metatarsal head and left lateral foot over the base of the fifth metatarsal. There is undermining on the left lateral foot which I removed before application of total contact cast continuing with Hydrofera Blue new. Patient tells me he was seen by endocrinology today lab work was done [Dr. Kerr]. Also wondering whether he was referred to cardiology. I went over some lab work from previously does not have chronic renal failure certainly not nephrotic range proteinuria he does have very poorly controlled diabetes but this is not his most updated lab work. Hemoglobin A1c has been over 11 1/10; the patient had a considerable amount of leakage towards mid part of his left foot with macerated skin however the wound surface looks better the area on the right lateral fifth met head is better as well. I am going to change the dressing on the left foot under the total contact cast to silver alginate, continue with Hydrofera Blue on the right. 1/20; patient was in the total contact cast for 10 days. Considerable amount of drainage although the skin around the wound does not look too bad on the left foot. The area on the right fifth metatarsal head is closed. Our nursing staff reports large amount of drainage out of the left lateral foot wound 1/25; continues with copious amounts of drainage described by our intake staff. PCR culture I did last week showed E. coli and Enterococcus faecalis  and low quantities. Multiple resistance genes documented including extended spectrum beta lactamase, MRSA, MRSE, quinolone, tetracycline. The wound is not quite as good this week as it was 5 days ago but about the same size 2/3; continues with copious amounts of malodorous drainage per our intake nurse. The PCR culture I did 2 weeks ago showed E. coli and low quantities of Enterococcus. There were multiple resistance genes detected. I put Neosporin  on him last week although this does not seem to have helped. The wound is slightly deeper today. Offloading continues to be an issue here although with the amount of drainage she has a total contact cast is just not going to work 2/10; moderate amount of drainage. Patient reports he cannot get his stocking on over the dressing. I told him we have to do that the nurse gave him suggestions on how to make this work. The wound is on the bottom and lateral part of his left foot. Is cultured predominantly grew low amounts of Enterococcus, E. coli and anaerobes. There were multiple resistance genes detected including extended spectrum beta lactamase, quinolone, tetracycline. I could not think of an easy oral combination to address this so for now I am going to do topical antibiotics provided by [facility] I think the main agents here are vancomycin and an aminoglycoside. We have to be able to give him access to the wounds to get the topical antibiotic on 2/17; moderate amount of drainage this is unchanged. He has his Keystone topical antibiotic against the deep tissue culture organisms. He has been using this and changing the dressing daily. Silver alginate on the wound surface. 2/24; using Keystone antibiotic with silver alginate on the top. He had too much drainage for a total contact cast at one point although I think that is improving and I think in the next week or 2 it might be possible to replace a total contact cast I did not do this today. In general the  wound surface looks healthy however he continues to have thick rims of skin and subcutaneous tissue around the wide area of the circumference which I debrided 06/04/2020 upon evaluation today patient appears to be doing well in regard to his wound. I do feel like he is showing signs of improvement. There is little bit of callus and dead tissue around the edges of the wound as well as what appears to be a little bit of a sinus tract that is off to the side laterally I would perform debridement to clear that away today. 3/17; left lateral foot. The wound looks about the same as I remember. Not much depth surface looks healthy. No evidence of infection 3/25; left lateral foot. Wound surface looks about the same. Separating epithelium from the circumference. There really is no evidence of infection here however not making progress by my view 3/29; left lateral foot. Surface of the wound again looks reasonably healthy still thick skin and subcutaneous tissue around the wound margins. There is no evidence of infection. One of the concerns being brought up by the nurses has again the amount of drainage vis--vis continued use of a total contact cast Objective Constitutional Sitting or standing Blood Pressure is within target range for patient.. Pulse regular and within target range for patient.Marland Kitchen Respirations regular, non-labored and within target range.. Temperature is normal and within the target range for the patient.Marland Kitchen Appears in no distress. Vitals Time Taken: 4:08 PM, Height: 77 in, Weight: 280 lbs, BMI: 33.2, Temperature: 98.3 F, Pulse: 99 bpm, Respiratory Rate: 18 breaths/min, Blood Pressure: 138/82 mmHg, Capillary Blood Glucose: 150 mg/dl. General Notes: glucose per pt report General Notes: Wound exam; no major changes. Some maceration around the wound edges but not much. The big problem here is thick skin and subcutaneous tissue around the wound circumference sited an aggressive debridement today  with a #5 curet hemostasis with silver nitrate and direct pressure we then replaced the total contact cast  still using Hydrofera Blue Integumentary (Hair, Skin) Wound #3 status is Open. Original cause of wound was Trauma. The date acquired was: 10/02/2019. The wound has been in treatment 35 weeks. The wound is located on the Left,Lateral Foot. The wound measures 3cm length x 3.1cm width x 0.4cm depth; 7.304cm^2 area and 2.922cm^3 volume. There is Fat Layer (Subcutaneous Tissue) exposed. There is no tunneling or undermining noted. There is a medium amount of serous drainage noted. The wound margin is thickened. There is large (67-100%) red granulation within the wound bed. There is no necrotic tissue within the wound bed. 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 There was a Excisional Skin/Subcutaneous Tissue Debridement with a total area of 10 sq cm performed by [name], MD. With the following instrument(s): Curette to remove Viable and Non-Viable tissue/material. Material removed includes Subcutaneous Tissue, Skin: Dermis, and Skin: Epidermis after achieving pain control using Lidocaine. No specimens were taken. A time out was conducted at 16:29, prior to the start of the procedure. A Minimum amount of bleeding was controlled with Pressure. The procedure was tolerated well with a pain level of 0 throughout and a pain level of 0 following the procedure. Post Debridement Measurements: 3cm length x 3.1cm width x 0.4cm depth; 2.922cm^3 volume. Character of Wound/Ulcer Post Debridement is improved. Severity of Tissue Post Debridement is: Fat layer exposed. Post procedure Diagnosis  Wound #3: Same as Pre-Procedure Pre-procedure diagnosis of Wound #3 is a Diabetic Wound/Ulcer of the Lower Extremity located on the Left,Lateral Foot . There was a T Contact Cast otal Procedure by [name], MD. Post procedure Diagnosis Wound #3: Same as Pre-Procedure Plan Follow-up Appointments: Return Appointment in 1 week. Cellular or Tissue Based Products: Cellular or Tissue Based Product Type: - Dermagraft approved but has 20% copay. Hold off for now per Dr. Dellia Nims. Bathing/ Shower/ Hygiene: May shower and wash wound with soap and water. Edema Control - Lymphedema / SCD / Other: Elevate legs to the level of the heart or above for 30 minutes daily and/or when sitting, a frequency of: - throughout the day Avoid standing for long periods of time. Exercise regularly Moisturize legs daily. - both legs every night before bed. Compression stocking or Garment 20-30 mm/Hg pressure to: - Apply to right leg in the morning and remove at night. Off-Loading: T Contact Cast to Left Lower Extremity otal WOUND #3: - Foot Wound Laterality: Left, Lateral Cleanser: Normal Saline (Generic) 1 x Per Day/30 Days Discharge Instructions: Cleanse the wound with Normal Saline prior to applying a clean dressing using gauze sponges, not tissue or cotton balls. Cleanser: Soap and Water 1 x Per Day/30 Days Discharge Instructions: May shower and wash wound with dial antibacterial soap and water prior to dressing change. Peri-Wound Care: Zinc Oxide Ointment 30g tube 1 x Per Day/30 Days Discharge Instructions: Apply Zinc Oxide to periwound with each dressing change as needed. Peri-Wound Care: Sween Lotion (Moisturizing lotion) 1 x Per Day/30 Days Discharge Instructions: Apply moisturizing lotion as directed Topical: 1 x Per Day/30 Days Prim Dressing: Hydrofera Blue Ready Foam, 2.5 x2.5 in 1 x Per Day/30 Days ary Discharge Instructions: Apply to wound bed as instructed Secondary Dressing: Woven Gauze  Sponge, Non-Sterile 4x4  in 1 x Per Day/30 Days Discharge Instructions: Apply over primary dressing as directed. Secondary Dressing: ABD Pad, 8x10 1 x Per Day/30 Days Discharge Instructions: T down before applying kerlix. Apply over primary dressing as directed. ape Secondary Dressing: Zetuvit Plus 4x8 in 1 x Per Day/30 Days Discharge Instructions: Apply over primary dressing as directed. Secured With: [employer], 4.5x3.1 (in/yd) (Generic) 1 x Per Day/30 Days Discharge Instructions: Secure with Kerlix as directed. Secured With: 10M Medipore H Soft Cloth Surgical T 4 x 2 (in/yd) (Generic) 1 x Per Day/30 Days ape Discharge Instructions: Secure dressing with tape as directed. Add-Ons: 1 x Per Day/30 Days #1Continue with Hydrofera Blue, is to visit, AVD, kerlix and again under the total contact cast. #2 the major question is whether with we are going to be able to handle the drainage. We were using zinc oxide around the wound to protect the surrounding skin. #3 aggressive debridement today I'm hopeful to finally get some epithelialization here. Electronic Signature(s) Signed: 07/02/2020 7:47:56 AM By: [name] MD Entered By: [name] on 07/01/2020 18:16:45 -------------------------------------------------------------------------------- Total Contact Cast Details Patient Name: Date of Service: [name], [name] 06/30/2020 4:00 PM Medical Record Number: [id number] Patient Account Number: [id number] Date of Birth/Sex: Treating RN: 11-Apr-1985 (35 y.o. [name], [name] Primary Care Provider: [name] Other Clinician: Referring Provider: Treating Provider/Extender: [name] in Treatment: 28 T Contact Cast Applied for Wound Assessment: otal Wound #3 Left,Lateral Foot Performed By: Physician [name], MD Post Procedure Diagnosis Same as Pre-procedure Electronic Signature(s) Signed: 06/30/2020 5:10:40 PM By: Rhae Hammock  RN Signed: 07/02/2020 7:47:56 AM By: [name] MD Entered By: Rhae Hammock on 06/30/2020 16:31:01 -------------------------------------------------------------------------------- SuperBill Details Patient Name: Date of Service: [name], [name] 06/30/2020 Medical Record Number: [id number] Patient Account Number: [id number] Date of Birth/Sex: Treating RN: March 11, 1986 (35 y.o. [name], [name] Primary Care Provider: [name] Other Clinician: Referring Provider: Treating Provider/Extender: [name] in Treatment: 35 Diagnosis Coding ICD-10 Codes Code Description E11.621 Type 2 diabetes mellitus with foot ulcer L97.528 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ity Facility Procedures CPT4 Code: [id number] Description: 49324 - DEB SUBQ TISSUE 20 SQ CM/< ICD-10 Diagnosis Description L97.528 Non-pressure chronic ulcer of other part of left foot with other specified seve Modifier: rity Quantity: 1 Physician Procedures : CPT4 Code Description Modifier [id number] - WC PHYS SUBQ TISS 20 SQ CM ICD-10 Diagnosis Description L97.528 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ity Quantity: 1 Electronic Signature(s) Signed: 07/02/2020 7:47:56 AM By: [name] MD Previous Signature: 06/30/2020 5:10:40 PM Version By: Rhae Hammock RN Entered By: [name] on 07/01/2020 18:17:39

## 2020-07-03 NOTE — Progress Notes (Signed)
[place], [name] ([id number]) Visit Report for 06/30/2020 Arrival Information Details Patient Name: Date of Service: [name] 06/30/2020 4:00 PM Medical Record Number: [id number] Patient Account Number: [id number] Date of Birth/Sex: Treating RN: [birth year] (35 y.o. [name] Primary Care Shaundrea Carrigg: [name] Other Clinician: Referring Dondrea Clendenin: Treating Evelia Waskey/Extender: [name] in Treatment: 59 Visit Information History Since Last Visit Added or deleted any medications: No Patient Arrived: Ambulatory Any new allergies or adverse reactions: No Arrival Time: 16:07 Had a fall or experienced change in No Accompanied By: alone activities of daily living that may affect Transfer Assistance: None risk of falls: Patient Identification Verified: Yes Signs or symptoms of abuse/neglect since last visito No Secondary Verification Process Completed: Yes Hospitalized since last visit: No Patient Requires Transmission-Based Precautions: No Implantable device outside of the clinic excluding No Patient Has Alerts: No cellular tissue based products placed in the center since last visit: Has Dressing in Place as Prescribed: Yes Has Footwear/Offloading in Place as Prescribed: Yes Left: T Contact Cast otal Pain Present Now: No Electronic Signature(s) Signed: 07/02/2020 5:46:28 PM By: [name] RN, BSN Entered By: [name] on 06/30/2020 16:08:05 -------------------------------------------------------------------------------- Encounter Discharge Information Details Patient Name: Date of Service: [name], [name] 06/30/2020 4:00 PM Medical Record Number: [id number] Patient Account Number: [id number] Date of Birth/Sex: Treating RN: 1985-12-20 (35 y.o. [name] Primary Care Yarielys Beed: [name] Other Clinician: Referring Yukari Flax: Treating Deadra Diggins/Extender: [name] in Treatment: 35 Encounter  Discharge Information Items Post Procedure Vitals Discharge Condition: Stable Temperature (F): 98.3 Ambulatory Status: Ambulatory Pulse (bpm): 99 Discharge Destination: Home Respiratory Rate (breaths/min): 18 Transportation: Private Auto Blood Pressure (mmHg): 138/82 Schedule Follow-up Appointment: Yes Clinical Summary of Care: Provided on 06/30/2020 Form Type Recipient Paper Patient Patient Electronic Signature(s) Signed: 06/30/2020 5:06:26 PM By: [name] Entered By: [name] on 06/30/2020 17:06:26 -------------------------------------------------------------------------------- Multi Wound Chart Details Patient Name: Date of Service: [name], [name] 06/30/2020 4:00 PM Medical Record Number: [id number] Patient Account Number: [id number] Date of Birth/Sex: Treating RN: 1985/11/27 (35 y.o. [name], [name] Primary Care Seville Downs: [name] Other Clinician: Referring Deandrew Hoecker: Treating Norabelle Kondo/Extender: [name] in Treatment: 35 Vital Signs Height(in): 77 Capillary Blood Glucose(mg/dl): 150 Weight(lbs): 280 Pulse(bpm): 99 Body Mass Index(BMI): 33 Blood Pressure(mmHg): 138/82 Temperature(F): 98.3 Respiratory Rate(breaths/min): 18 Photos: [N/A:N/A] Left, Lateral Foot N/A N/A Wound Location: Trauma N/A N/A Wounding Event: Diabetic Wound/Ulcer of the Lower N/A N/A Primary Etiology: Extremity Type II Diabetes N/A N/A Comorbid History: 10/02/2019 N/A N/A Date Acquired: 35 N/A N/A Weeks of Treatment: Open N/A N/A Wound Status: 3x3.1x0.4 N/A N/A Measurements L x W x D (cm) 7.304 N/A N/A A (cm) : rea 2.922 N/A N/A Volume (cm) : -342.90% N/A N/A % Reduction in A rea: -1670.90% N/A N/A % Reduction in Volume: Grade 2 N/A N/A Classification: Medium N/A N/A Exudate A mount: Serous N/A N/A Exudate Type: amber N/A N/A Exudate Color: Thickened N/A N/A Wound Margin: Large (67-100%) N/A N/A Granulation A  mount: Red N/A N/A Granulation Quality: None Present (0%) N/A N/A Necrotic A mount: Fat Layer (Subcutaneous Tissue): Yes N/A N/A Exposed Structures: Fascia: No Tendon: No Muscle: No Joint: No Bone: No Small (1-33%) N/A N/A Epithelialization: Debridement - Excisional N/A N/A Debridement: Pre-procedure Verification/Time Out 16:29 N/A N/A Taken: Lidocaine N/A N/A Pain Control: Subcutaneous N/A N/A Tissue Debrided: Skin/Subcutaneous Tissue N/A N/A Level: 10 N/A N/A Debridement A (sq cm): rea Curette N/A N/A  Instrument: Minimum N/A N/A Bleeding: Pressure N/A N/A Hemostasis A chieved: 0 N/A N/A Procedural Pain: 0 N/A N/A Post Procedural Pain: Procedure was tolerated well N/A N/A Debridement Treatment Response: 3x3.1x0.4 N/A N/A Post Debridement Measurements L x W x D (cm) 2.922 N/A N/A Post Debridement Volume: (cm) Debridement N/A N/A Procedures Performed: T Contact Cast otal Treatment Notes Wound #3 (Foot) Wound Laterality: Left, Lateral Cleanser Normal Saline Discharge Instruction: Cleanse the wound with Normal Saline prior to applying a clean dressing using gauze sponges, not tissue or cotton balls. Soap and Water Discharge Instruction: May shower and wash wound with dial antibacterial soap and water prior to dressing change. Peri-Wound Care Zinc Oxide Ointment 30g tube Discharge Instruction: Apply Zinc Oxide to periwound with each dressing change as needed. Sween Lotion (Moisturizing lotion) Discharge Instruction: Apply moisturizing lotion as directed Topical Primary Dressing Hydrofera Blue Ready Foam, 2.5 x2.5 in Discharge Instruction: Apply to wound bed as instructed Secondary Dressing Woven Gauze Sponge, Non-Sterile 4x4 in Discharge Instruction: Apply over primary dressing as directed. ABD Pad, 8x10 Discharge Instruction: T down before applying kerlix. Apply over primary dressing as directed. ape Zetuvit Plus 4x8 in Discharge Instruction: Apply  over primary dressing as directed. Secured With [employer], 4.5x3.1 (in/yd) Discharge Instruction: Secure with Kerlix as directed. 97M Medipore H Soft Cloth Surgical T 4 x 2 (in/yd) ape Discharge Instruction: Secure dressing with tape as directed. Compression Wrap Compression Stockings Add-Ons Electronic Signature(s) Signed: 07/02/2020 7:47:56 AM By: [name] MD Signed: 07/03/2020 5:01:26 PM By: Rhae Hammock RN Entered By: [name] on 07/01/2020 18:10:57 -------------------------------------------------------------------------------- Multi-Disciplinary Care Plan Details Patient Name: Date of Service: [name], [name] 06/30/2020 4:00 PM Medical Record Number: [id number] Patient Account Number: [id number] Date of Birth/Sex: Treating RN: 01-10-1986 (35 y.o. [name], [name] Primary Care Sacha Radloff: [name] Other Clinician: Referring Monicka Cyran: Treating Sharol Croghan/Extender: [name] in Treatment: 81 Multidisciplinary Care Plan reviewed with physician Active Inactive Nutrition Nursing Diagnoses: Imbalanced nutrition Potential for alteratiion in Nutrition/Potential for imbalanced nutrition Goals: Patient/caregiver agrees to and verbalizes understanding of need to use nutritional supplements and/or vitamins as prescribed Date Initiated: 10/24/2019 Date Inactivated: 04/06/2020 Target Resolution Date: 04/03/2020 Goal Status: Met Patient/caregiver will maintain therapeutic glucose control Date Initiated: 10/24/2019 Target Resolution Date: 08/18/2020 Goal Status: Active Interventions: Assess HgA1c results as ordered upon admission and as needed Assess patient nutrition upon admission and as needed per policy Provide education on elevated blood sugars and impact on wound healing Provide education on nutrition Treatment Activities: Education provided on Nutrition : 04/28/2020 Notes: Electronic Signature(s) Signed: 06/30/2020  5:10:40 PM By: Rhae Hammock RN Entered By: Rhae Hammock on 06/30/2020 16:27:56 -------------------------------------------------------------------------------- Pain Assessment Details Patient Name: Date of Service: [name], [name] 06/30/2020 4:00 PM Medical Record Number: [id number] Patient Account Number: [id number] Date of Birth/Sex: Treating RN: [birth year] (35 y.o. [name] Primary Care Tildon Silveria: [name] Other Clinician: Referring Cherylyn Sundby: Treating Demetrious Rainford/Extender: [name] in Treatment: 35 Active Problems Location of Pain Severity and Description of Pain Patient Has Paino No Site Locations Pain Management and Medication Current Pain Management: Electronic Signature(s) Signed: 07/02/2020 5:46:28 PM By: [name] RN, BSN Entered By: [name] on 06/30/2020 16:09:12 -------------------------------------------------------------------------------- Patient/Caregiver Education Details Patient Name: Date of Service: [name] 3/29/2022andnbsp4:00 PM Medical Record Number: [id number] Patient Account Number: [id number] Date of Birth/Gender: Treating RN: 08/10/1985 (35 y.o. [name] Primary Care Physician: [name] Other Clinician: Referring Physician: Treating Physician/Extender:  [name] in Treatment: 77 Education Assessment Education Provided To: Patient Education Topics Provided Wound/Skin Impairment: Methods: Explain/Verbal Responses: State content correctly [employer]) Signed: 06/30/2020 5:10:40 PM By: Rhae Hammock RN Entered By: Rhae Hammock on 06/30/2020 16:34:57 -------------------------------------------------------------------------------- Wound Assessment Details Patient Name: Date of Service: [name], [name] 06/30/2020 4:00 PM Medical Record Number: [id number] Patient Account Number: [id number] Date of  Birth/Sex: Treating RN: 1985-12-29 (35 y.o. [name] Primary Care Larrell Rapozo: [name] Other Clinician: Referring Harjot Dibello: Treating Nadean Montanaro/Extender: [name] in Treatment: 35 Wound Status Wound Number: 3 Primary Etiology: Diabetic Wound/Ulcer of the Lower Extremity Wound Location: Left, Lateral Foot Wound Status: Open Wounding Event: Trauma Comorbid History: Type II Diabetes Date Acquired: 10/02/2019 Weeks Of Treatment: 35 Clustered Wound: No Photos Wound Measurements Length: (cm) 3 Width: (cm) 3.1 Depth: (cm) 0.4 Area: (cm) 7.304 Volume: (cm) 2.922 % Reduction in Area: -342.9% % Reduction in Volume: -1670.9% Epithelialization: Small (1-33%) Tunneling: No Undermining: No Wound Description Classification: Grade 2 Wound Margin: Thickened Exudate Amount: Medium Exudate Type: Serous Exudate Color: amber Foul Odor After Cleansing: No Slough/Fibrino No Wound Bed Granulation Amount: Large (67-100%) Exposed Structure Granulation Quality: Red Fascia Exposed: No Necrotic Amount: None Present (0%) Fat Layer (Subcutaneous Tissue) Exposed: Yes Tendon Exposed: No Muscle Exposed: No Joint Exposed: No Bone Exposed: No Treatment Notes Wound #3 (Foot) Wound Laterality: Left, Lateral Cleanser Normal Saline Discharge Instruction: Cleanse the wound with Normal Saline prior to applying a clean dressing using gauze sponges, not tissue or cotton balls. Soap and Water Discharge Instruction: May shower and wash wound with dial antibacterial soap and water prior to dressing change. Peri-Wound Care Zinc Oxide Ointment 30g tube Discharge Instruction: Apply Zinc Oxide to periwound with each dressing change as needed. Sween Lotion (Moisturizing lotion) Discharge Instruction: Apply moisturizing lotion as directed Topical Primary Dressing Hydrofera Blue Ready Foam, 2.5 x2.5 in Discharge Instruction: Apply to wound bed as instructed Secondary  Dressing Woven Gauze Sponge, Non-Sterile 4x4 in Discharge Instruction: Apply over primary dressing as directed. ABD Pad, 8x10 Discharge Instruction: T down before applying kerlix. Apply over primary dressing as directed. ape Zetuvit Plus 4x8 in Discharge Instruction: Apply over primary dressing as directed. Secured With [employer], 4.5x3.1 (in/yd) Discharge Instruction: Secure with Kerlix as directed. 8M Medipore H Soft Cloth Surgical T 4 x 2 (in/yd) ape Discharge Instruction: Secure dressing with tape as directed. Compression Wrap Compression Stockings Add-Ons Electronic Signature(s) Signed: 07/01/2020 9:05:34 AM By: Sandre Kitty Signed: 07/02/2020 5:46:28 PM By: [name] RN, BSN Entered By: Sandre Kitty on 06/30/2020 16:49:14 -------------------------------------------------------------------------------- [place] Details Patient Name: Date of Service: [name], [name] 06/30/2020 4:00 PM Medical Record Number: [id number] Patient Account Number: [id number] Date of Birth/Sex: Treating RN: November 10, 1985 (35 y.o. [name] Primary Care Ronak Duquette: [name] Other Clinician: Referring Judyth Demarais: Treating Kishon Garriga/Extender: [name] in Treatment: 35 Vital Signs Time Taken: 16:08 Temperature (F): 98.3 Height (in): 77 Pulse (bpm): 99 Weight (lbs): 280 Respiratory Rate (breaths/min): 18 Body Mass Index (BMI): 33.2 Blood Pressure (mmHg): 138/82 Capillary Blood Glucose (mg/dl): 150 Reference Range: 80 - 120 mg / dl Notes glucose per pt report Electronic Signature(s) Signed: 07/02/2020 5:46:28 PM By: [name] RN, BSN Entered By: [name] on 06/30/2020 16:09:07

## 2020-07-07 ENCOUNTER — Encounter (HOSPITAL_BASED_OUTPATIENT_CLINIC_OR_DEPARTMENT_OTHER): Payer: BC Managed Care – PPO | Attending: Internal Medicine | Admitting: Internal Medicine

## 2020-07-07 ENCOUNTER — Other Ambulatory Visit: Payer: Self-pay

## 2020-07-07 DIAGNOSIS — Z8616 Personal history of COVID-19: Secondary | ICD-10-CM | POA: Insufficient documentation

## 2020-07-07 DIAGNOSIS — E11621 Type 2 diabetes mellitus with foot ulcer: Secondary | ICD-10-CM | POA: Diagnosis present

## 2020-07-07 DIAGNOSIS — L97528 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ity: Secondary | ICD-10-CM | POA: Insufficient documentation

## 2020-07-07 DIAGNOSIS — L97519 Non-pressure chronic ulcer of other part of right foot with unspecified severity: Secondary | ICD-10-CM | POA: Insufficient documentation

## 2020-07-08 NOTE — Progress Notes (Signed)
[name] ([id number]) Visit Report for 07/07/2020 HPI Details Patient Name: Date of Service: [name] 07/07/2020 4:00 PM Medical Record Number: [id number] Patient Account Number: [id number] Date of Birth/Sex: Treating RN: 1985-12-03 (35 y.o. 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 He comes in for review of a wound over the plantar fifth metatarsal head extending into the lateral part of the foot. He was followed for this previously by his podiatrist Dr. Cornelius Moras. As the patient tells his story he went to see podiatry first for a swelling he developed on the lateral part of his fifth metatarsal head in May. He states this was "open" by podiatry and the area closed. He was followed up in June and it was again opened callus removed and it closed promptly. There were plans being made for surgery on the fifth metatarsal head in June however his blood sugar was apparently too high for anesthesia. Apparently the area was debrided and opened again in June and it is never closed since. Looking over the records from podiatry I am really not able to follow this. It was clear when he was first seen it was before 5/14 at that point he already had a wound. By 5/17 the ulcer was resolved. I do not see anything about a procedure. On 5/28 noted to have pre-ulcerative moderate keratosis. X-ray noted 1/5 contracted toe and tailor's bunion and metatarsal deformity. On a visit date on 09/28/2018 the dorsal part of the left foot it healed and resolved. There was concern about swelling in his lower extremity he was sent to the ER.. As far as I can tell he was seen in the ER on 7/12 with an ulcer on his left foot. A DVT rule  out of the left leg was negative. I do not think I have complete records from podiatry but I am not able to verify the procedures this patient states he had. He states after the last procedure the wound has never closed although I am not able to follow this in the records I have from podiatry. He has not had a recent x-ray The patient has been using Neosporin on the wound. He is wearing a Darco shoe. He is still very active up on his foot working and exercising. Past medical history; type 2 diabetes ketosis-prone, leg swelling with a negative DVT study in July. Non-smoker ABI in our clinic was 0.85 on the left 10/16; substantial wound on the plantar left fifth met head extending laterally almost to the dorsal fifth MTP. We have been using silver alginate we gave him a Darco forefoot off loader. An x-ray did not show evidence of osteomyelitis did note soft tissue emphysema which I think was due to gas tracking through an open wound. There is no doubt in my mind he requires an MRI 10/23; MRI not booked until 3 November at the earliest this is largely due to his glucose sensor in the right arm. We have been using silver alginate. There has been an improvement 10/29; I am still not exactly sure when his MRI is booked for. He says it is the third but it is the 10th in epic. This definitely needs to be done. He is running a low-grade fever today but no other symptoms. No real improvement  in the 1 02/26/2019 35 year old man presents today for a follow-up visit here in our clinic he is last been seen in the clinic on October 29. Subsequently we were working on getting MRI to evaluate and see what exactly was going on and where we would need to go from the standpoint of whether or not he had osteomyelitis and again what treatments were going be required. Subsequently the patient ended up being admitted to the hospital on 02/07/2019 and was discharged on 02/14/2019. This is a somewhat interesting admission with a  discharge diagnosis of pneumonia due to COVID-19 although he was positive for COVID-19 when tested at the urgent care but negative x2 when he was actually in the hospital. With that being said he did have acute respiratory failure with hypoxia and it was noted he also have a left foot ulceration with osteomyelitis. With that being said he did require oxygen for his pneumonia and I level 4 L. He was placed on antivirals and steroids for the COVID-19. He was also transferred to the [facility] at one point. Nonetheless he did subsequently discharged home and since being home has done much better in that regard. The CT angiogram did not show any pulmonary embolism. With regard to the osteomyelitis the patient was placed on vancomycin and Zosyn while in the hospital but has been changed to Augmentin at discharge. It was also recommended that he follow- up with wound care and podiatry. Podiatry however wanted him to see [name] according to the patient prior to them doing anything further. His hemoglobin A1c was 9.9 as noted in the hospital. Have an MRI of the left foot performed while in the hospital on 02/04/2019. 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 There is an overlying plantar open wound noted an abscess tracking back along the lateral aspect of the fifth metatarsal shaft. There is otherwise diffuse cellulitis and mild fasciitis without findings of polymyositis. The patient did have recently pneumonia secondary to COVID-19 I looked in the chart through epic and it does appear that the patient may need to have an additional x-ray just to ensure everything is cleared and that he has no airspace disease prior to putting him into the [name]. 03/05/2019; patient was readmitted to the clinic last week. He was hospitalized twice for a viral upper respiratory tract infection from 11/1 through 11/4 and  then 11/5 through 11/12 ultimately this turned out to be Covid pneumonitis. Although he was discharged on oxygen he is not using it. He says he feels fine. He has no exercise limitation no cough no sputum. His O2 sat in our clinic today was 100% on room air. 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 He received Vanco and Zosyn in the hospital and then was discharged on 2 weeks of Augmentin. I do not see any relevant cultures. He was supposed to follow-up with infectious disease but I do not see that he has an appointment. 12/8; patient saw Dr. Novella Olive of infectious disease last week. He felt that he had had adequate antibiotic therapy. He did not go to follow-up with Dr. Amalia Hailey of podiatry and I have again talked to him about the pros and cons of this. He does not want to consider a ray amputation of this time. He is aware of the risks of  recurrence, migration etc. He started HBO today and tolerated this well. He can complete the Augmentin that I gave him last week. I have looked over the lab work that Dr. Chana Bode ordered his C-reactive protein was 3.3 and his sedimentation rate was 17. The C-reactive protein is never really been measurably that high in this patient 12/15; not much change in the wound today however he has undermining along the lateral part of the foot again more extensively than last week. He has some rims of epithelialization. We have been using silver alginate. He is undergoing hyperbarics but did not dive today 12/18; in for his obligatory first total contact cast change. Unfortunately there was pus coming from the undermining area around his fifth metatarsal head. This was cultured but will preclude reapplication of a cast. He is seen in conjunction with HBO 12/24; patient had staph lugdunensis in the wound in the undermining area laterally last time. We put him on  doxycycline which should have covered this. The wound looks better today. I am going to give him another week of doxycycline before reattempting the total contact cast 12/31; the patient is completing antibiotics. Hemorrhagic debris in the distal part of the wound with some undermining distally. He also had hyper granulation. Extensive debridement with a #5 curette. The infected area that was on the lateral part of the fifth met head is closed over. I do not think he needs any more antibiotics. Patient was seen prior to HBO. Preparations for a total contact cast were made in the cast will be placed post hyperbarics 04/11/19; once again the patient arrives today without complaint. He had been in a cast all week noted that he had heavy drainage this week. This resulted in large raised areas of macerated tissue around the wound 1/14; wound bed looks better slightly smaller. Hydrofera Blue has been changing himself. He had a heavy drainage last week which caused a lot of maceration around the wound so I took him out of a total contact cast he says the drainage is actually better this week He is seen today in conjunction with HBO 1/21; returns to clinic. He was up in [place] for a day or 2 attending a funeral. He comes back in with the wound larger and with a large area of exposed bone. He had osteomyelitis and septic arthritis of the fifth left metatarsal head while he was in hospital. He received IV antibiotics in the hospital for a prolonged period of time then 3 weeks of Augmentin. Subsequently I gave him 2 weeks of doxycycline for more superficial wound infection. When I saw this last week the wound was smaller the surface of the wound looks satisfactory. 1/28; patient missed hyperbarics today. Bone biopsy I did last time showed Enterococcus faecalis and Staphylococcus lugdunensis . He has a wide area of exposed bone. We are going to use silver alginate as of today. I had another ethical discussion  with the patient. 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 In this situation I think the likelihood of healing this is low. Therefore I have recommended a ray amputation and with the patient's agreement I have referred him to Dr. Doran Durand. The other issue is that his compliance with hyperbarics has been minimal because of his work schedule and given his underlying decision I am going to stop this today READMISSION 10/24/2019 MRI 09/29/2019 left foot IMPRESSION: 1. Apparent skin ulceration inferior and lateral to the 5th metatarsal base with underlying heterogeneous T2 signal  and enhancement in the subcutaneous fat. Small peripherally enhancing fluid collections along the plantar and lateral aspects of the 5th metatarsal base suspicious for abscesses. 2. Interval amputation through the mid 5th metatarsal with nonspecific low-level marrow edema and enhancement. Given the proximity to the adjacent soft tissue inflammatory changes, osteomyelitis cannot be excluded. 3. The additional bones appear unremarkable. MRI 09/29/2019 right foot IMPRESSION: 1. Soft tissue ulceration lateral to the 5th MTP joint. There is low-level T2 hyperintensity within the 4th and 5th metatarsal heads and adjacent proximal phalanges without abnormal T1 signal or cortical destruction. These findings are nonspecific and could be seen with early marrow edema, hyperemia or early osteomyelitis. No evidence of septic joint. 2. Mild tenosynovitis and synovial enhancement associated with the extensor digitorum tendons at the level of the midfoot. 3. Diffuse low-level muscular T2 hyperintensity and enhancement, most consistent with diabetic myopathy. LEFT FOOT BONE Methicillin resistant staphylococcus aureus Staphylococcus lugdunensis MIC MIC CIPROFLOXACIN >=8 RESISTANT Resistant <=0.5 SENSI... Sensitive CLINDAMYCIN <=0.25 SENS... Sensitive >=8 RESISTANT Resistant ERYTHROMYCIN >=8  RESISTANT Resistant >=8 RESISTANT Resistant GENTAMICIN <=0.5 SENSI... Sensitive <=0.5 SENSI... Sensitive Inducible Clindamycin NEGATIVE Sensitive NEGATIVE Sensitive OXACILLIN >=4 RESISTANT Resistant 2 SENSITIVE Sensitive RIFAMPIN <=0.5 SENSI... Sensitive <=0.5 SENSI... Sensitive TETRACYCLINE <=1 SENSITIVE Sensitive <=1 SENSITIVE Sensitive TRIMETH/SULFA <=10 SENSIT Sensitive <=10 SENSIT Sensitive ... Marland Kitchen.. VANCOMYCIN 1 SENSITIVE Sensitive <=0.5 SENSI... Sensitive Right foot bone . Component 3 wk ago Specimen Description BONE Special Requests RIGHT 4 METATARSAL SAMPLE B Gram Stain NO WBC SEEN NO ORGANISMS SEEN Culture RARE METHICILLIN RESISTANT STAPHYLOCOCCUS AUREUS NO ANAEROBES ISOLATED Performed at [facility], [place] [address], [place], [place] [address] Report Status 10/08/2019 FINAL Organism ID, Bacteria METHICILLIN RESISTANT STAPHYLOCOCCUS AUREUS Resulting Agency [facility] Susceptibility Methicillin resistant staphylococcus aureus MIC CIPROFLOXACIN >=8 RESISTANT Resistant CLINDAMYCIN <=0.25 SENS... Sensitive ERYTHROMYCIN >=8 RESISTANT Resistant GENTAMICIN <=0.5 SENSI... Sensitive Inducible Clindamycin NEGATIVE Sensitive OXACILLIN >=4 RESISTANT Resistant RIFAMPIN <=0.5 SENSI... Sensitive TETRACYCLINE <=1 SENSITIVE Sensitive TRIMETH/SULFA <=10 SENSIT Sensitive ... VANCOMYCIN 1 SENSITIVE Sensitive This is a patient we had in clinic earlier this year with a wound over his left fifth metatarsal head. 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 Doran Durand on 05/16/2019. He seems to have developed problems with wounds on his bilateral feet in June 2021 although he may have had problems earlier than  this. He was in an urgent care with a right foot ulcer on 09/26/2019 and given a course of doxycycline. This was apparently after having trouble getting into see orthopedics. He was seen by podiatry on 09/28/2019 noted to have bilateral lower extremity ulcers including the left lateral fifth metatarsal base and the right subfifth met head. It was noted that had purulent drainage at that time. He required hospitalization from 6/20 through 7/2. This was because of worsening right foot wounds. He underwent bilateral operative incision and drainage and bone biopsies bilaterally. Culture results are listed above. He has been referred back to clinic by Dr. Jacqualyn Posey of podiatry. He is also followed by Dr. Megan Salon who saw him yesterday. He was discharged from hospital on Zyvox Flagyl and Levaquin and yesterday changed to doxycycline Flagyl and Levaquin. His inflammatory markers on 6/26 showed a sedimentation rate of 129 and a C-reactive protein of 5. This is  improved to 14 and 1.3 respectively. This would indicate improvement. ABIs in our clinic today were 1.23 on the right and 1.20 on the left 11/01/2019 on evaluation today patient appears to be doing fairly well in regard to the wounds on his feet at this point. Fortunately there is no signs of active infection at this time. No fevers, chills, nausea, vomiting, or diarrhea. He currently is seeing infectious disease and still under their care at this point. Subsequently he also has both wounds which she has not been using collagen on as he did not receive that in his packaging he did not call us and let us know that. Apparently that just was missed on the order. Nonetheless we will get that straightened out today. 8/9-Patient returns for bilateral foot wounds, using Prisma with hydrogel moistened dressings, and the wounds appear stable. Patient using surgical shoes, avoiding much pressure or weightbearing as much as possible 8/16; patient has bilateral foot  wounds. 1 on the right lateral foot proximally the other is on the left mid lateral foot. Both required debridement of callus and thick skin around the wounds. We have been using silver collagen 8/27; patient has bilateral lateral foot wounds. The area on the left substantially surrounded by callus and dry skin. This was removed from the wound edge. The underlying wound is small. The area on the right measured somewhat smaller today. 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 Unfortunately I did not update his antibiotics during today's visit. 9/10 I reviewed Dr. Hale Bogus last notes he felt he had completed antibiotics his inflammatory markers were reasonably well controlled. He has a small wound on the lateral left foot and a tiny area on the right which is just above closed. He is using Hydrofera Blue with border foam he has bilateral surgical shoes 9/24; 2 week f/u. doing well. right foot is closed. left foot still undermined. 10/14; right foot remains closed at the fifth met head. The area over the base of the left fifth metatarsal has a small open area but considerable undermining towards the plantar foot. Thick callus skin around this suggests an adequate pressure relief. We have talked about this. He says he is going to go back into his cam boot. I suggested a total contact cast he did not seem enamored with this suggestion 10/26; left foot base of the fifth metatarsal. Same condition as last time. He has skin over the area with an open wound however the skin is not adherent. He went to see Dr. Earleen Newport who did an x-ray and culture of his foot I have not reviewed the x-ray but the patient was not told anything. He is on doxycycline 11/11; since the patient was last here he was in the emergency room on 10/30 he was concerned about swelling in the left foot. They did not do any cultures or x-rays. They changed his antibiotics to  cephalexin. Previous culture showed group B strep. The cephalexin is appropriate as doxycycline has less than predictable coverage. Arrives in clinic today with swelling over this area under the wound. He also has a new wound on the right fifth metatarsal head 11/18; the patient has a difficult wound on the lateral aspect of the left fifth metatarsal head. The wound was almost ballotable last week I opened it slightly expecting to see purulence however there was just bleeding. I cultured this this was negative. X-ray unchanged. We are trying to get an MRI  but I am not sure were going to be able to get this through his insurance. He also has an area on the right lateral fifth metatarsal head this looks healthier 12/3; the patient finally got our MRI. Surprisingly this did not show osteomyelitis. I did show the soft tissue ulceration at the lateral plantar aspect of the fifth metatarsal base with a tiny residual 6 mm abscess overlying the superficial fascia I have tried to culture this area I have not been able to get this to grow anything. Nevertheless the protruding tissue looks aggravated. I suspect we should try to treat the underlying "abscess with broad-spectrum antibiotics. I am going to start him on Levaquin and Flagyl. He has much less edema in his legs and I am going to continue to wrap his legs and see him weekly 12/10. I started Levaquin and Flagyl on him last week. He just picked up the Flagyl apparently there was some delay. The worry is the wound on the left fifth metatarsal base which is substantial and worsening. His foot looks like he inverts at the ankle making this a weightbearing surface. Certainly no improvement in fact I think the measurements of this are somewhat worse. We have been using 12/17; he apparently just got the Levaquin yesterday this is 2 weeks after the fact. He has completed the Flagyl. The area over the left fifth metatarsal base still has protruding granulation  tissue although it does not look quite as bad as it did some weeks ago. He has severe bilateral lymphedema although we have not been treating him for wounds on his legs this is definitely going to require compression. There was so much edema in the left I did not wish to put him in a total contact cast today. I am going to increase his compression from 3-4 layer. The area on the right lateral fifth met head actually look quite good and superficial. 12/23; patient arrived with callus on the right fifth met head and the substantial hyper granulated callused wound on the base of his fifth metatarsal. He says he is completing his Levaquin in 2 days but I do not think that adds up with what I gave him but I will have to double check this. We are using Hydrofera Blue on both areas. My plan is to put the left leg in a cast the week after New Year's 04/06/2020; patient's wounds about the same. Right lateral fifth metatarsal head and left lateral foot over the base of the fifth metatarsal. There is undermining on the left lateral foot which I removed before application of total contact cast continuing with Hydrofera Blue new. Patient tells me he was seen by endocrinology today lab work was done [Dr. Kerr]. Also wondering whether he was referred to cardiology. I went over some lab work from previously does not have chronic renal failure certainly not nephrotic range proteinuria he does have very poorly controlled diabetes but this is not his most updated lab work. Hemoglobin A1c has been over 11 1/10; the patient had a considerable amount of leakage towards mid part of his left foot with macerated skin however the wound surface looks better the area on the right lateral fifth met head is better as well. I am going to change the dressing on the left foot under the total contact cast to silver alginate, continue with Hydrofera Blue on the right. 1/20; patient was in the total contact cast for 10 days. Considerable  amount of drainage although the skin around  the wound does not look too bad on the left foot. The area on the right fifth metatarsal head is closed. Our nursing staff reports large amount of drainage out of the left lateral foot wound 1/25; continues with copious amounts of drainage described by our intake staff. PCR culture I did last week showed E. coli and Enterococcus faecalis and low quantities. Multiple resistance genes documented including extended spectrum beta lactamase, MRSA, MRSE, quinolone, tetracycline. The wound is not quite as good this week as it was 5 days ago but about the same size 2/3; continues with copious amounts of malodorous drainage per our intake nurse. The PCR culture I did 2 weeks ago showed E. coli and low quantities of Enterococcus. There were multiple resistance genes detected. I put Neosporin on him last week although this does not seem to have helped. The wound is slightly deeper today. Offloading continues to be an issue here although with the amount of drainage she has a total contact cast is just not going to work 2/10; moderate amount of drainage. Patient reports he cannot get his stocking on over the dressing. I told him we have to do that the nurse gave him suggestions on how to make this work. The wound is on the bottom and lateral part of his left foot. Is cultured predominantly grew low amounts of Enterococcus, E. coli and anaerobes. There were multiple resistance genes detected including extended spectrum beta lactamase, quinolone, tetracycline. I could not think of an easy oral combination to address this so for now I am going to do topical antibiotics provided by [facility] I think the main agents here are vancomycin and an aminoglycoside. We have to be able to give him access to the wounds to get the topical antibiotic on 2/17; moderate amount of drainage this is unchanged. He has his Keystone topical antibiotic against the deep tissue culture organisms. He  has been using this and changing the dressing daily. Silver alginate on the wound surface. 2/24; using Keystone antibiotic with silver alginate on the top. He had too much drainage for a total contact cast at one point although I think that is improving and I think in the next week or 2 it might be possible to replace a total contact cast I did not do this today. In general the wound surface looks healthy however he continues to have thick rims of skin and subcutaneous tissue around the wide area of the circumference which I debrided 06/04/2020 upon evaluation today patient appears to be doing well in regard to his wound. I do feel like he is showing signs of improvement. There is little bit of callus and dead tissue around the edges of the wound as well as what appears to be a little bit of a sinus tract that is off to the side laterally I would perform debridement to clear that away today. 3/17; left lateral foot. The wound looks about the same as I remember. Not much depth surface looks healthy. No evidence of infection 3/25; left lateral foot. Wound surface looks about the same. Separating epithelium from the circumference. There really is no evidence of infection here however not making progress by my view 3/29; left lateral foot. Surface of the wound again looks reasonably healthy still thick skin and subcutaneous tissue around the wound margins. There is no evidence of infection. One of the concerns being brought up by the nurses has again the amount of drainage vis--vis continued use of a total contact cast 4/5; left  lateral foot at roughly the base of the fifth metatarsal. Nice healthy looking granulated tissue with rims of epithelialization. The overall wound measurements are not any better but the tissue looks healthy. The only concern is the amount of drainage although he has no surrounding maceration with what we have been doing recently to absorb fluid and protect his skin. He also has  lymphedema. 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 [name]: [name] Other Clinician: Referring Provider: Treating Provider/Extender: [name] in Treatment: 36 Constitutional Sitting or standing Blood Pressure is within target range for patient.. Pulse regular and within target range for patient.Marland Kitchen Respirations regular, non-labored and within target range.. Temperature is normal and within the target range for the patient.Marland Kitchen Appears in no distress. Notes Wound exam; no major changes. He has no maceration around the wound. A reasonably healthy looking 360 degree rim of epithelialization the granulation looks reasonably healthy not particularly friable. He does have fairly significant lymphedema peripheral pulses are palpable Electronic Signature(s) Signed: 07/07/2020 4:57:40 PM By: [name] MD Entered By: [name] on 07/07/2020 16:46:47 -------------------------------------------------------------------------------- Physician Orders Details Patient Name: Date of Service: [name], [name] 07/07/2020 4:00 PM Medical Record Number: [id number] Patient Account Number: [id number] Date of Birth/Sex: Treating RN: [birth year] (35 y.o. [name]: [name] Other Clinician: Referring Provider: Treating  Provider/Extender: [name] in Treatment: 62 Verbal / Phone Orders: No Diagnosis Coding Follow-up Appointments Return Appointment in 1 week. Cellular or Tissue Based Products Cellular or Tissue Based Product Type: - Dermagraft approved but has 20% copay. Hold off for now per Dr. Dellia Nims. Bathing/ Shower/ Hygiene May shower and wash wound with soap and water. Edema Control - Lymphedema / SCD / Other Bilateral Lower Extremities Elevate legs to the level of the heart or above for 30 minutes daily and/or when sitting, a frequency of: - throughout the day Avoid standing for long periods of time. Exercise regularly Moisturize legs daily. - both legs every night before bed. Compression stocking or Garment 20-30 mm/Hg pressure to: - Apply to right leg in the morning and remove at night. Off-Loading Total Contact Cast to Left Lower Extremity Wound Treatment Wound #3 - Foot Wound Laterality: Left, Lateral Cleanser: Normal Saline (Generic) 1 x Per Day/30 Days Discharge Instructions: Cleanse the wound with Normal Saline prior to applying a clean dressing using gauze sponges, not tissue or cotton balls. Cleanser: Soap and Water 1 x Per Day/30 Days Discharge Instructions: May shower and wash wound with dial antibacterial soap and water prior to dressing change. Peri-Wound Care: Zinc Oxide Ointment 30g tube 1 x Per Day/30 Days Discharge Instructions: Apply Zinc Oxide to periwound with each dressing change as needed. Peri-Wound Care: Sween Lotion (Moisturizing lotion) 1 x Per Day/30 Days Discharge Instructions: Apply moisturizing lotion as directed Topical: 1 x Per Day/30 Days Prim Dressing: Hydrofera Blue Ready Foam, 2.5 x2.5 in 1 x Per Day/30 Days ary Discharge Instructions: Apply to  wound bed as instructed Secondary Dressing: Woven Gauze Sponge, Non-Sterile 4x4 in 1 x Per Day/30 Days Discharge Instructions: Apply over primary dressing as directed. Secondary Dressing:  ABD Pad, 8x10 1 x Per Day/30 Days Discharge Instructions: T down before applying kerlix. Apply over primary dressing as directed. ape Secondary Dressing: Zetuvit Plus 4x8 in 1 x Per Day/30 Days Discharge Instructions: Apply over primary dressing as directed. Compression Wrap: Kerlix Roll 4.5x3.1 (in/yd) 1 x Per Day/30 Days Discharge Instructions: Apply Kerlix and Coban compression as directed. Compression Wrap: Coban Self-Adherent Wrap 4x5 (in/yd) 1 x Per Day/30 Days Discharge Instructions: Apply over Kerlix as directed. Add-Ons: 1 x Per Day/30 Days Electronic Signature(s) Signed: 07/07/2020 4:57:40 PM By: [name] MD Signed: 07/08/2020 5:28:50 PM By: Rhae Hammock RN Entered By: Rhae Hammock on 07/07/2020 16:37:02 -------------------------------------------------------------------------------- Problem List Details Patient Name: Date of Service: [name], [name] 07/07/2020 4:00 PM Medical Record Number: [id number] Patient Account Number: [id number] Date of Birth/Sex: Treating RN: April 29, 1985 (35 y.o. 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, right ankle and foot 10/24/2019 10/24/2019 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, left ankle and foot 10/24/2019 10/24/2019 B95.62 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 10/24/2019  10/24/2019 classified elsewhere Resolved Problems Electronic Signature(s) Signed: 07/07/2020 4:57:40 PM By: [name] MD Entered By: [name] on 07/07/2020 16:37:28 -------------------------------------------------------------------------------- Progress Note Details Patient Name: Date of Service: [name], [name] 07/07/2020 4:00 PM Medical Record Number: [id number] Patient Account Number: [id number] Date of Birth/Sex: Treating RN: 1985/04/18 (35 y.o. 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 He comes in for review of a wound over the plantar fifth metatarsal head extending into the lateral part of the foot. He was followed for this previously by his podiatrist Dr. Cornelius Moras. As the patient tells his story he went to see podiatry first for a swelling he developed on the lateral part of his fifth metatarsal head in May. He states this was "open" by podiatry and the area closed. He was followed up in June and it was again opened callus removed and it closed promptly. There were plans being made for surgery on the fifth metatarsal head in June however his blood sugar was apparently too high for anesthesia. Apparently the area was debrided and opened again in June and it is never closed since. Looking over the records from podiatry I am really not able to follow this. It was clear when he was first seen it was before 5/14 at that point he already had a wound. By 5/17 the ulcer was resolved. I do not see anything about a procedure. On 5/28 noted to have pre-ulcerative moderate keratosis. X-ray noted 1/5 contracted toe and tailor's bunion and metatarsal deformity. On a visit date on 09/28/2018 the dorsal part  of the left  foot it healed and resolved. There was concern about swelling in his lower extremity he was sent to the ER.. As far as I can tell he was seen in the ER on 7/12 with an ulcer on his left foot. A DVT rule out of the left leg was negative. I do not think I have complete records from podiatry but I am not able to verify the procedures this patient states he had. He states after the last procedure the wound has never closed although I am not able to follow this in the records I have from podiatry. He has not had a recent x-ray The patient has been using Neosporin on the wound. He is wearing a Darco shoe. He is still very active up on his foot working and exercising. Past medical history; type 2 diabetes ketosis-prone, leg swelling with a negative DVT study in July. Non-smoker ABI in our clinic was 0.85 on the left 10/16; substantial wound on the plantar left fifth met head extending laterally almost to the dorsal fifth MTP. We have been using silver alginate we gave him a Darco forefoot off loader. An x-ray did not show evidence of osteomyelitis did note soft tissue emphysema which I think was due to gas tracking through an open wound. There is no doubt in my mind he requires an MRI 10/23; MRI not booked until 3 November at the earliest this is largely due to his glucose sensor in the right arm. We have been using silver alginate. There has been an improvement 10/29; I am still not exactly sure when his MRI is booked for. He says it is the third but it is the 10th in epic. This definitely needs to be done. He is running a low-grade fever today but no other symptoms. No real improvement in the 1 02/26/2019 35 year old man presents today for a follow-up visit here in our clinic he is last been seen in the clinic on October 29. Subsequently we were working on getting MRI to evaluate and see what exactly was going on and where we would need to go from the standpoint of whether or not he had osteomyelitis  and again what treatments were going be required. Subsequently the patient ended up being admitted to the hospital on 02/07/2019 and was discharged on 02/14/2019. This is a somewhat interesting admission with a discharge diagnosis of pneumonia due to COVID-19 although he was positive for COVID-19 when tested at the urgent care but negative x2 when he was actually in the hospital. With that being said he did have acute respiratory failure with hypoxia and it was noted he also have a left foot ulceration with osteomyelitis. With that being said he did require oxygen for his pneumonia and I level 4 L. He was placed on antivirals and steroids for the COVID-19. He was also transferred to the [facility] at one point. Nonetheless he did subsequently discharged home and since being home has done much better in that regard. The CT angiogram did not show any pulmonary embolism. With regard to the osteomyelitis the patient was placed on vancomycin and Zosyn while in the hospital but has been changed to Augmentin at discharge. It was also recommended that he follow- up with wound care and podiatry. Podiatry however wanted him to see [name] according to the patient prior to them doing anything further. His hemoglobin A1c was 9.9 as noted in the hospital. Have an MRI of the left foot performed while in the hospital on 02/04/2019. This showed  evidence of septic arthritis at the fifth MTP joint and osteomyelitis involving the fifth metatarsal head and proximal phalanx. There is an overlying plantar open wound noted an abscess tracking back along the lateral aspect of the fifth metatarsal shaft. There is otherwise diffuse cellulitis and mild fasciitis without findings of polymyositis. The patient did have recently pneumonia secondary to COVID-19 I looked in the chart through epic and it does appear that the patient may need to have an additional x-ray just to ensure everything is cleared and that he has no airspace  disease prior to putting him into the [name]. 03/05/2019; patient was readmitted to the clinic last week. He was hospitalized twice for a viral upper respiratory tract infection from 11/1 through 11/4 and then 11/5 through 11/12 ultimately this turned out to be Covid pneumonitis. Although he was discharged on oxygen he is not using it. He says he feels fine. He has no exercise limitation no cough no sputum. His O2 sat in our clinic today was 100% on room air. 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 He received Vanco and Zosyn in the hospital and then was discharged on 2 weeks of Augmentin. I do not see any relevant cultures. He was supposed to follow-up with infectious disease but I do not see that he has an appointment. 12/8; patient saw Dr. Novella Olive of infectious disease last week. He felt that he had had adequate antibiotic therapy. He did not go to follow-up with Dr. Amalia Hailey of podiatry and I have again talked to him about the pros and cons of this. He does not want to consider a ray amputation of this time. He is aware of the risks of recurrence, migration etc. He started HBO today and tolerated this well. He can complete the Augmentin that I gave him last week. I have looked over the lab work that Dr. Chana Bode ordered his C-reactive protein was 3.3 and his sedimentation rate was 17. The C-reactive protein is never really been measurably that high in this patient 12/15; not much change in the wound today however he has undermining along the lateral part of the foot again more extensively than last week. He has some rims of epithelialization. We have been using silver alginate. He is undergoing hyperbarics but did not dive today 12/18; in for his obligatory first total contact cast change. Unfortunately there was pus coming from the undermining area around his fifth metatarsal  head. This was cultured but will preclude reapplication of a cast. He is seen in conjunction with HBO 12/24; patient had staph lugdunensis in the wound in the undermining area laterally last time. We put him on doxycycline which should have covered this. The wound looks better today. I am going to give him another week of doxycycline before reattempting the total contact cast 12/31; the patient is completing antibiotics. Hemorrhagic debris in the distal part of the wound with some undermining distally. He also had hyper granulation. Extensive debridement with a #5 curette. The infected area that was on the lateral part of the fifth met head is closed over. I do not think he needs any more antibiotics. Patient was seen prior to HBO. Preparations for a total contact cast were made in the cast will be placed post hyperbarics 04/11/19; once again the patient arrives today without complaint. He had been in a cast all week noted that he had heavy drainage  this week. This resulted in large raised areas of macerated tissue around the wound 1/14; wound bed looks better slightly smaller. Hydrofera Blue has been changing himself. He had a heavy drainage last week which caused a lot of maceration around the wound so I took him out of a total contact cast he says the drainage is actually better this week He is seen today in conjunction with HBO 1/21; returns to clinic. He was up in [place] for a day or 2 attending a funeral. He comes back in with the wound larger and with a large area of exposed bone. He had osteomyelitis and septic arthritis of the fifth left metatarsal head while he was in hospital. He received IV antibiotics in the hospital for a prolonged period of time then 3 weeks of Augmentin. Subsequently I gave him 2 weeks of doxycycline for more superficial wound infection. When I saw this last week the wound was smaller the surface of the wound looks satisfactory. 1/28; patient missed hyperbarics today.  Bone biopsy I did last time showed Enterococcus faecalis and Staphylococcus lugdunensis . He has a wide area of exposed bone. We are going to use silver alginate as of today. I had another ethical discussion with the patient. 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 In this situation I think the likelihood of healing this is low. Therefore I have recommended a ray amputation and with the patient's agreement I have referred him to Dr. Doran Durand. The other issue is that his compliance with hyperbarics has been minimal because of his work schedule and given his underlying decision I am going to stop this today READMISSION 10/24/2019 MRI 09/29/2019 left foot IMPRESSION: 1. Apparent skin ulceration inferior and lateral to the 5th metatarsal base with underlying heterogeneous T2 signal and enhancement in the subcutaneous fat. Small peripherally enhancing fluid collections along the plantar and lateral aspects of the 5th metatarsal base suspicious for abscesses. 2. Interval amputation through the mid 5th metatarsal with nonspecific low-level marrow edema and enhancement. Given the proximity to the adjacent soft tissue inflammatory changes, osteomyelitis cannot be excluded. 3. The additional bones appear unremarkable. MRI 09/29/2019 right foot IMPRESSION: 1. Soft tissue ulceration lateral to the 5th MTP joint. There is low-level T2 hyperintensity within the 4th and 5th metatarsal heads and adjacent proximal phalanges without abnormal T1 signal or cortical destruction. These findings are nonspecific and could be seen with early marrow edema, hyperemia or early osteomyelitis. No evidence of septic joint. 2. Mild tenosynovitis and synovial enhancement associated with the extensor digitorum tendons at the level of the midfoot. 3. Diffuse low-level muscular T2 hyperintensity and enhancement, most consistent with diabetic myopathy. LEFT FOOT BONE Methicillin resistant  staphylococcus aureus Staphylococcus lugdunensis MIC MIC CIPROFLOXACIN >=8 RESISTANT Resistant <=0.5 SENSI... Sensitive CLINDAMYCIN <=0.25 SENS... Sensitive >=8 RESISTANT Resistant ERYTHROMYCIN >=8 RESISTANT Resistant >=8 RESISTANT Resistant GENTAMICIN <=0.5 SENSI... Sensitive <=0.5 SENSI... Sensitive Inducible Clindamycin NEGATIVE Sensitive NEGATIVE Sensitive OXACILLIN >=4 RESISTANT Resistant 2 SENSITIVE Sensitive RIFAMPIN <=0.5 SENSI... Sensitive <=0.5 SENSI... Sensitive TETRACYCLINE <=1 SENSITIVE Sensitive <=1 SENSITIVE Sensitive TRIMETH/SULFA <=10 SENSIT Sensitive <=10 SENSIT Sensitive ... Marland Kitchen.. VANCOMYCIN 1 SENSITIVE Sensitive <=0.5 SENSI... Sensitive Right foot bone . Component 3 wk ago Specimen Description BONE Special Requests RIGHT 4 METATARSAL SAMPLE B Gram Stain NO WBC SEEN NO ORGANISMS SEEN Culture RARE METHICILLIN RESISTANT STAPHYLOCOCCUS AUREUS NO ANAEROBES ISOLATED Performed at [facility], [place] [address] [place], [place] [address] Report Status 10/08/2019 FINAL Organism ID, Bacteria METHICILLIN RESISTANT STAPHYLOCOCCUS AUREUS  Resulting Agency [facility] Susceptibility Methicillin resistant staphylococcus aureus MIC CIPROFLOXACIN >=8 RESISTANT Resistant CLINDAMYCIN <=0.25 SENS... Sensitive ERYTHROMYCIN >=8 RESISTANT Resistant GENTAMICIN <=0.5 SENSI... Sensitive Inducible Clindamycin NEGATIVE Sensitive OXACILLIN >=4 RESISTANT Resistant RIFAMPIN <=0.5 SENSI... Sensitive TETRACYCLINE <=1 SENSITIVE Sensitive TRIMETH/SULFA <=10 SENSIT Sensitive ... VANCOMYCIN 1 SENSITIVE Sensitive This is a patient we had in clinic earlier this year with a wound over his left fifth metatarsal head. 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 Doran Durand on 05/16/2019. He seems to have developed problems with wounds on his bilateral feet in June 2021 although he may have had problems earlier than this. He was in an urgent care with a right foot ulcer on 09/26/2019 and given a course of doxycycline. This was apparently after having trouble getting into see orthopedics. He was seen by podiatry on 09/28/2019 noted to have bilateral lower extremity ulcers including the left lateral fifth metatarsal base and the right subfifth met head. It was noted that had purulent drainage at that time. He required hospitalization from 6/20 through 7/2. This was because of worsening right foot wounds. He underwent bilateral operative incision and drainage and bone biopsies bilaterally. Culture results are listed above. He has been referred back to clinic by Dr. Jacqualyn Posey of podiatry. He is also followed by Dr. Megan Salon who saw him yesterday. He was discharged from hospital on Zyvox Flagyl and Levaquin and yesterday changed to doxycycline Flagyl and Levaquin. His inflammatory markers on 6/26 showed a sedimentation rate of 129 and a C-reactive protein of 5. This is improved to 14 and 1.3 respectively. This would indicate improvement. ABIs in our clinic today were 1.23 on the right and 1.20 on the left 11/01/2019 on evaluation today patient appears to be doing fairly well in regard to the wounds on his feet at this point. Fortunately there is no signs of active infection at this time. No fevers, chills, nausea, vomiting, or diarrhea. He currently is seeing infectious disease and still under their care at this point. Subsequently he also has both wounds which she has not been using collagen on as he did not receive that in his packaging he did not call us and let us know that. Apparently that just was missed on the order. Nonetheless we will get that straightened out today. 8/9-Patient returns for bilateral foot wounds, using  Prisma with hydrogel moistened dressings, and the wounds appear stable. Patient using surgical shoes, avoiding much pressure or weightbearing as much as possible 8/16; patient has bilateral foot wounds. 1 on the right lateral foot proximally the other is on the left mid lateral foot. Both required debridement of callus and thick skin around the wounds. We have been using silver collagen 8/27; patient has bilateral lateral foot wounds. The area on the left substantially surrounded by callus and dry skin. This was removed from the wound edge. The underlying wound is small. The area on the right measured somewhat smaller today. 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 Unfortunately I did not update his antibiotics during today's visit. 9/10 I reviewed Dr. Hale Bogus last notes he felt he had completed antibiotics his inflammatory markers were reasonably well controlled. He has a small wound on the  lateral left foot and a tiny area on the right which is just above closed. He is using Hydrofera Blue with border foam he has bilateral surgical shoes 9/24; 2 week f/u. doing well. right foot is closed. left foot still undermined. 10/14; right foot remains closed at the fifth met head. The area over the base of the left fifth metatarsal has a small open area but considerable undermining towards the plantar foot. Thick callus skin around this suggests an adequate pressure relief. We have talked about this. He says he is going to go back into his cam boot. I suggested a total contact cast he did not seem enamored with this suggestion 10/26; left foot base of the fifth metatarsal. Same condition as last time. He has skin over the area with an open wound however the skin is not adherent. He went to see Dr. Earleen Newport who did an x-ray and culture of his foot I have not reviewed the x-ray but the patient was not told anything. He is on  doxycycline 11/11; since the patient was last here he was in the emergency room on 10/30 he was concerned about swelling in the left foot. They did not do any cultures or x-rays. They changed his antibiotics to cephalexin. Previous culture showed group B strep. The cephalexin is appropriate as doxycycline has less than predictable coverage. Arrives in clinic today with swelling over this area under the wound. He also has a new wound on the right fifth metatarsal head 11/18; the patient has a difficult wound on the lateral aspect of the left fifth metatarsal head. The wound was almost ballotable last week I opened it slightly expecting to see purulence however there was just bleeding. I cultured this this was negative. X-ray unchanged. We are trying to get an MRI but I am not sure were going to be able to get this through his insurance. He also has an area on the right lateral fifth metatarsal head this looks healthier 12/3; the patient finally got our MRI. Surprisingly this did not show osteomyelitis. I did show the soft tissue ulceration at the lateral plantar aspect of the fifth metatarsal base with a tiny residual 6 mm abscess overlying the superficial fascia I have tried to culture this area I have not been able to get this to grow anything. Nevertheless the protruding tissue looks aggravated. I suspect we should try to treat the underlying "abscess with broad-spectrum antibiotics. I am going to start him on Levaquin and Flagyl. He has much less edema in his legs and I am going to continue to wrap his legs and see him weekly 12/10. I started Levaquin and Flagyl on him last week. He just picked up the Flagyl apparently there was some delay. The worry is the wound on the left fifth metatarsal base which is substantial and worsening. His foot looks like he inverts at the ankle making this a weightbearing surface. Certainly no improvement in fact I think the measurements of this are somewhat worse. We  have been using 12/17; he apparently just got the Levaquin yesterday this is 2 weeks after the fact. He has completed the Flagyl. The area over the left fifth metatarsal base still has protruding granulation tissue although it does not look quite as bad as it did some weeks ago. He has severe bilateral lymphedema although we have not been treating him for wounds on his legs this is definitely going to require compression. There was so much edema in the left I did  not wish to put him in a total contact cast today. I am going to increase his compression from 3-4 layer. The area on the right lateral fifth met head actually look quite good and superficial. 12/23; patient arrived with callus on the right fifth met head and the substantial hyper granulated callused wound on the base of his fifth metatarsal. He says he is completing his Levaquin in 2 days but I do not think that adds up with what I gave him but I will have to double check this. We are using Hydrofera Blue on both areas. My plan is to put the left leg in a cast the week after New Year's 04/06/2020; patient's wounds about the same. Right lateral fifth metatarsal head and left lateral foot over the base of the fifth metatarsal. There is undermining on the left lateral foot which I removed before application of total contact cast continuing with Hydrofera Blue new. Patient tells me he was seen by endocrinology today lab work was done [Dr. Kerr]. Also wondering whether he was referred to cardiology. I went over some lab work from previously does not have chronic renal failure certainly not nephrotic range proteinuria he does have very poorly controlled diabetes but this is not his most updated lab work. Hemoglobin A1c has been over 11 1/10; the patient had a considerable amount of leakage towards mid part of his left foot with macerated skin however the wound surface looks better the area on the right lateral fifth met head is better as well. I am  going to change the dressing on the left foot under the total contact cast to silver alginate, continue with Hydrofera Blue on the right. 1/20; patient was in the total contact cast for 10 days. Considerable amount of drainage although the skin around the wound does not look too bad on the left foot. The area on the right fifth metatarsal head is closed. Our nursing staff reports large amount of drainage out of the left lateral foot wound 1/25; continues with copious amounts of drainage described by our intake staff. PCR culture I did last week showed E. coli and Enterococcus faecalis and low quantities. Multiple resistance genes documented including extended spectrum beta lactamase, MRSA, MRSE, quinolone, tetracycline. The wound is not quite as good this week as it was 5 days ago but about the same size 2/3; continues with copious amounts of malodorous drainage per our intake nurse. The PCR culture I did 2 weeks ago showed E. coli and low quantities of Enterococcus. There were multiple resistance genes detected. I put Neosporin on him last week although this does not seem to have helped. The wound is slightly deeper today. Offloading continues to be an issue here although with the amount of drainage she has a total contact cast is just not going to work 2/10; moderate amount of drainage. Patient reports he cannot get his stocking on over the dressing. I told him we have to do that the nurse gave him suggestions on how to make this work. The wound is on the bottom and lateral part of his left foot. Is cultured predominantly grew low amounts of Enterococcus, E. coli and anaerobes. There were multiple resistance genes detected including extended spectrum beta lactamase, quinolone, tetracycline. I could not think of an easy oral combination to address this so for now I am going to do topical antibiotics provided by [facility] I think the main agents here are vancomycin and an aminoglycoside. We have to be  able to  give him access to the wounds to get the topical antibiotic on 2/17; moderate amount of drainage this is unchanged. He has his Keystone topical antibiotic against the deep tissue culture organisms. He has been using this and changing the dressing daily. Silver alginate on the wound surface. 2/24; using Keystone antibiotic with silver alginate on the top. He had too much drainage for a total contact cast at one point although I think that is improving and I think in the next week or 2 it might be possible to replace a total contact cast I did not do this today. In general the wound surface looks healthy however he continues to have thick rims of skin and subcutaneous tissue around the wide area of the circumference which I debrided 06/04/2020 upon evaluation today patient appears to be doing well in regard to his wound. I do feel like he is showing signs of improvement. There is little bit of callus and dead tissue around the edges of the wound as well as what appears to be a little bit of a sinus tract that is off to the side laterally I would perform debridement to clear that away today. 3/17; left lateral foot. The wound looks about the same as I remember. Not much depth surface looks healthy. No evidence of infection 3/25; left lateral foot. Wound surface looks about the same. Separating epithelium from the circumference. There really is no evidence of infection here however not making progress by my view 3/29; left lateral foot. Surface of the wound again looks reasonably healthy still thick skin and subcutaneous tissue around the wound margins. There is no evidence of infection. One of the concerns being brought up by the nurses has again the amount of drainage vis--vis continued use of a total contact cast 4/5; left lateral foot at roughly the base of the fifth metatarsal. Nice healthy looking granulated tissue with rims of epithelialization. The overall wound measurements are not any  better but the tissue looks healthy. The only concern is the amount of drainage although he has no surrounding maceration with what we have been doing recently to absorb fluid and protect his skin. He also has lymphedema. 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 Pulse regular and within target range for patient.Marland Kitchen Respirations regular, non-labored and within target range.. Temperature is normal and within the target range for the patient.Marland Kitchen Appears in no distress. Vitals Time Taken: 3:55 PM, Height: 77 in, Weight: 280 lbs, BMI: 33.2, Temperature: 98.3 F, Pulse: 96 bpm, Respiratory Rate: 18 breaths/min, Blood Pressure: 139/83 mmHg, Capillary Blood Glucose: 139 mg/dl. General Notes: glucose per pt report General Notes: Wound exam; no major changes. He has no maceration around the wound. A reasonably healthy looking 360 degree rim of epithelialization the granulation looks reasonably healthy not particularly friable. He does have fairly significant lymphedema peripheral pulses are palpable Integumentary (Hair, Skin) Wound #3 status is Open. Original cause of wound was Trauma. The date acquired was: 10/02/2019. The wound has been in treatment 36 weeks. The wound is located on the Left,Lateral Foot. The wound measures 3cm length x 3.3cm width x 0.3cm depth; 7.775cm^2 area and 2.333cm^3 volume. There is Fat Layer (Subcutaneous Tissue) exposed. There is no tunneling or undermining noted.  There is a large amount of serous drainage noted. The wound margin is thickened. There is large (67-100%) pink granulation within the wound bed. There is no necrotic tissue within the wound bed. 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 There was a T Contact Cast otal Procedure by [name], MD. Post procedure Diagnosis Wound #3: Same as Pre-Procedure Plan Follow-up Appointments: Return Appointment in 1 week. Cellular or Tissue Based Products: Cellular or Tissue Based Product Type: - Dermagraft approved but has 20% copay. Hold off for now per Dr. Dellia Nims. Bathing/ Shower/ Hygiene: May shower and wash wound with soap and water. Edema Control - Lymphedema / SCD / Other: Elevate legs to the level of the heart or above for 30 minutes daily and/or when sitting, a frequency of: - throughout the day Avoid standing for long periods of time. Exercise regularly Moisturize legs daily. - both legs every night before bed. Compression stocking or Garment 20-30 mm/Hg pressure to: - Apply to right leg in the morning and remove at night. Off-Loading: T Contact Cast to Left Lower Extremity otal WOUND #3: - Foot Wound Laterality: Left, Lateral Cleanser: Normal Saline (Generic) 1 x Per Day/30 Days Discharge Instructions: Cleanse the wound with Normal Saline prior to applying a clean dressing using gauze sponges, not tissue or cotton balls. Cleanser: Soap and Water 1 x Per Day/30 Days Discharge Instructions: May shower and wash wound with dial antibacterial soap and water prior to dressing change. Peri-Wound Care: Zinc Oxide Ointment 30g tube 1 x Per Day/30 Days Discharge Instructions: Apply Zinc Oxide to periwound with each dressing change as needed. Peri-Wound Care: Sween Lotion (Moisturizing lotion) 1 x Per Day/30 Days Discharge Instructions: Apply moisturizing lotion as directed Topical: 1 x Per Day/30 Days Prim Dressing: Hydrofera Blue Ready Foam, 2.5 x2.5 in 1 x Per Day/30  Days ary Discharge Instructions: Apply to wound bed as instructed Secondary Dressing: Woven Gauze Sponge, Non-Sterile 4x4 in 1 x Per Day/30 Days Discharge Instructions: Apply over primary dressing as directed. Secondary Dressing: ABD Pad, 8x10 1 x Per Day/30 Days Discharge Instructions: T down before applying kerlix. Apply over primary dressing as directed. ape Secondary Dressing: Zetuvit Plus 4x8 in 1 x Per Day/30 Days Discharge Instructions: Apply over primary dressing as directed. Com pression Wrap: Kerlix Roll 4.5x3.1 (in/yd) 1 x Per Day/30 Days Discharge Instructions: Apply Kerlix and Coban compression as directed. Com pression Wrap: Coban Self-Adherent Wrap 4x5 (in/yd) 1 x Per Day/30 Days Discharge Instructions: Apply over Kerlix as directed. Add-Ons: 1 x Per Day/30 Days 1. Slight amount of progress 2. Still the Hydrofera Blue 3. I am going to put him in a kerlix Coban under the cast. I am trying to control swelling with the swelling for this man who is on his feet for long periods of the day. Hopefully to control some of the drainage from the wound area. 4. I counseled him to call us if there anything seems different or uncomfortable. I would view this is a bit of an experiment but I am really focused on trying to get further epithelialization. At one point we put a wound VAC on this area under a cast but we just could not get this to work 5. I am going to take him out of  school for 2 weeks we will then run into the spring break this should work in 2 3 weeks any degree of offloading we can do will be helpful Electronic Signature(s) Signed: 07/07/2020 4:57:40 PM By: [name] MD Entered By: [name] on 07/07/2020 16:48:21 -------------------------------------------------------------------------------- Total Contact Cast Details Patient Name: Date of Service: [name], [name] 07/07/2020 4:00 PM Medical Record Number: [id number] Patient Account Number: [id number] Date of  Birth/Sex: Treating RN: 1986-03-22 (35 y.o. [name]: [name] Other Clinician: Referring Provider: Treating Provider/Extender: [name] in Treatment: 36 T Contact Cast Applied for Wound Assessment: otal Wound #3 Left,Lateral Foot Performed By: Physician [name], MD Post Procedure Diagnosis Same as Pre-procedure Electronic Signature(s) Signed: 07/07/2020 4:57:40 PM By: [name] MD Entered By: [name] on 07/07/2020 16:44:40 -------------------------------------------------------------------------------- SuperBill Details Patient Name: Date of Service: [name], [name] 07/07/2020 Medical Record Number: [id number] Patient Account Number: [id number] Date of Birth/Sex: Treating RN: Oct 26, 1985 (35 y.o. [name], [name] Primary Care Provider: [name] Other Clinician: Referring Provider: Treating Provider/Extender: [name] in Treatment: 36 Diagnosis Coding ICD-10 Codes Code Description E11.621 Type 2 diabetes mellitus with foot ulcer L97.528 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ity Facility Procedures CPT4 Code: [id number] Description: 29445 - APPLY TOTAL CONTACT LEG CAST ICD-10 Diagnosis Description L97.528 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 E11.621 Type 2 diabetes mellitus with foot ulcer Modifier: ity Quantity: 1 Physician Procedures : CPT4 Code Description Modifier [id number] - WC PHYS APPLY TOTAL CONTACT CAST ICD-10 Diagnosis Description L97.528 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ity E11.621 Type 2 diabetes mellitus with foot ulcer Quantity: 1 Electronic Signature(s) Signed: 07/07/2020 4:57:40 PM By: [name] MD Entered By: [name] on 07/07/2020 16:48:40

## 2020-07-09 NOTE — Progress Notes (Signed)
[name], [name] ([id number]) Visit Report for 07/07/2020 Arrival Information Details Patient Name: Date of Service: [name] 07/07/2020 4:00 PM Medical Record Number: [id number] Patient Account Number: [id number] Date of Birth/Sex: Treating RN: 23-May-1985 (35 y.o. [name] Primary Care Zuri Lascala: [name] Other Clinician: Referring Korver Graybeal: Treating Briscoe Daniello/Extender: [name] in Treatment: 12 Visit Information History Since Last Visit Added or deleted any medications: No Patient Arrived: Knee Scooter Any new allergies or adverse reactions: No Arrival Time: 15:55 Had a fall or experienced change in No Accompanied By: alone activities of daily living that may affect Transfer Assistance: None risk of falls: Patient Identification Verified: Yes Signs or symptoms of abuse/neglect since last visito No Secondary Verification Process Completed: Yes Hospitalized since last visit: No Patient Requires Transmission-Based Precautions: No Implantable device outside of the clinic excluding No Patient Has Alerts: No cellular tissue based products placed in the center since last visit: Has Dressing in Place as Prescribed: Yes Has Footwear/Offloading in Place as Prescribed: Yes Left: T Contact Cast otal Pain Present Now: No Electronic Signature(s) Signed: 07/08/2020 5:44:43 PM By: [name] RN, BSN Entered By: [name] on 07/07/2020 16:09:05 -------------------------------------------------------------------------------- Encounter Discharge Information Details Patient Name: Date of Service: [name], [name] 07/07/2020 4:00 PM Medical Record Number: [id number] Patient Account Number: [id number] Date of Birth/Sex: Treating RN: [birth year] (35 y.o. [name] Primary Care Lillee Mooneyhan: [name] Other Clinician: Referring Desiree Fleming: Treating Naman Spychalski/Extender: [name] in Treatment: 36 Encounter  Discharge Information Items Discharge Condition: Stable Ambulatory Status: Walker Discharge Destination: Home Transportation: Private Auto Schedule Follow-up Appointment: Yes Clinical Summary of Care: Provided on 07/07/2020 Form Type Recipient Paper Patient Patient Electronic Signature(s) Signed: 07/07/2020 4:52:59 PM By: [name] Entered By: [name] on 07/07/2020 16:52:59 -------------------------------------------------------------------------------- Lower Extremity Assessment Details Patient Name: Date of Service: [name], [name] 07/07/2020 4:00 PM Medical Record Number: [id number] Patient Account Number: [id number] Date of Birth/Sex: Treating RN: 1985-06-14 (35 y.o. [name] Primary Care Anneta Rounds: [name] Other Clinician: Referring Raynisha Avilla: Treating Danel Studzinski/Extender: [name] in Treatment: 36 Edema Assessment Assessed: [name]: No] [name]: No] Edema: [Left: Ye] [Right: s] Calf Left: Right: Point of Measurement: 48 cm From Medial Instep 45.5 cm Ankle Left: Right: Point of Measurement: 10 cm From Medial Instep 27.5 cm Vascular Assessment Pulses: Dorsalis Pedis Palpable: [Left:Yes] Electronic Signature(s) Signed: 07/08/2020 5:44:43 PM By: [name] RN, BSN Entered By: [name] on 07/07/2020 16:09:39 -------------------------------------------------------------------------------- Multi Wound Chart Details Patient Name: Date of Service: [name], [name] 07/07/2020 4:00 PM Medical Record Number: [id number] Patient Account Number: [id number] Date of Birth/Sex: Treating RN: 09/01/1985 (35 y.o. [name], [name] Primary Care Eliel Dudding: [name] Other Clinician: Referring Pellegrino Kennard: Treating Earlie Arciga/Extender: [name] in Treatment: 36 Vital Signs Height(in): 77 Capillary Blood Glucose(mg/dl): 139 Weight(lbs): 280 Pulse(bpm): 96 Body Mass Index(BMI): 33 Blood  Pressure(mmHg): 139/83 Temperature(F): 98.3 Respiratory Rate(breaths/min): 18 Photos: [3:No Photos Left, Lateral Foot] [N/A:N/A N/A] Wound Location: [3:Trauma] [N/A:N/A] Wounding Event: [3:Diabetic Wound/Ulcer of the Lower] [N/A:N/A] Primary Etiology: [3:Extremity Type II Diabetes] [N/A:N/A] Comorbid History: [3:10/02/2019] [N/A:N/A] Date Acquired: [3:36] [N/A:N/A] Weeks of Treatment: [3:Open] [N/A:N/A] Wound Status: [3:3x3.3x0.3] [N/A:N/A] Measurements L x W x [name] [3:7.775] [N/A:N/A] A (cm) : rea [3:2.333] [N/A:N/A] Volume (cm) : [3:-371.50%] [N/A:N/A] % Reduction in A rea: [3:-1313.90%] [N/A:N/A] % Reduction in Volume: [3:Grade 2] [N/A:N/A] Classification: [3:Large] [N/A:N/A] Exudate A mount: [3:Serous] [N/A:N/A] Exudate Type: [3:amber] [N/A:N/A] Exudate  Color: [3:Thickened] [N/A:N/A] Wound Margin: [3:Large (67-100%)] [N/A:N/A] Granulation A mount: [3:Pink] [N/A:N/A] Granulation Quality: [3:None Present (0%)] [N/A:N/A] Necrotic A mount: [3:Fat Layer (Subcutaneous Tissue): Yes N/A] Exposed Structures: [3:Fascia: No Tendon: No Muscle: No Joint: No Bone: No Small (1-33%)] [N/A:N/A] Epithelialization: [3:T Contact Cast otal] [N/A:N/A] Treatment Notes Electronic Signature(s) Signed: 07/07/2020 4:57:40 PM By: [name] MD Signed: 07/08/2020 5:28:50 PM By: Rhae Hammock RN Entered By: [name] on 07/07/2020 16:44:19 -------------------------------------------------------------------------------- Multi-Disciplinary Care Plan Details Patient Name: Date of Service: [name], [name] 07/07/2020 4:00 PM Medical Record Number: [id number] Patient Account Number: [id number] Date of Birth/Sex: Treating RN: 10-Oct-1985 (35 y.o. [name], [name] Primary Care Adelma Bowdoin: [name] Other Clinician: Referring Belem Hintze: Treating Wanya Bangura/Extender: [name] in Treatment: 36 Multidisciplinary Care Plan reviewed with physician Active  Inactive Nutrition Nursing Diagnoses: Imbalanced nutrition Potential for alteratiion in Nutrition/Potential for imbalanced nutrition Goals: Patient/caregiver agrees to and verbalizes understanding of need to use nutritional supplements and/or vitamins as prescribed Date Initiated: 10/24/2019 Date Inactivated: 04/06/2020 Target Resolution Date: 04/03/2020 Goal Status: Met Patient/caregiver will maintain therapeutic glucose control Date Initiated: 10/24/2019 Target Resolution Date: 08/18/2020 Goal Status: Active Interventions: Assess HgA1c results as ordered upon admission and as needed Assess patient nutrition upon admission and as needed per policy Provide education on elevated blood sugars and impact on wound healing Provide education on nutrition Treatment Activities: Education provided on Nutrition : 06/18/2020 Notes: Electronic Signature(s) Signed: 07/08/2020 5:28:50 PM By: Rhae Hammock RN Entered By: Rhae Hammock on 07/07/2020 16:37:40 -------------------------------------------------------------------------------- Pain Assessment Details Patient Name: Date of Service: [name], [name] 07/07/2020 4:00 PM Medical Record Number: [id number] Patient Account Number: [id number] Date of Birth/Sex: Treating RN: Apr 09, 1985 (35 y.o. [name] Primary Care Sayid Moll: [name] Other Clinician: Referring Leesa Leifheit: Treating Imari Reen/Extender: [name] in Treatment: 36 Active Problems Location of Pain Severity and Description of Pain Patient Has Paino No Site Locations Pain Management and Medication Current Pain Management: Electronic Signature(s) Signed: 07/08/2020 5:44:43 PM By: [name] RN, BSN Entered By: [name] on 07/07/2020 16:09:34 -------------------------------------------------------------------------------- Patient/Caregiver Education Details Patient Name: Date of Service: [name]  4/5/2022andnbsp4:00 PM Medical Record Number: [id number] Patient Account Number: [id number] Date of Birth/Gender: Treating RN: Apr 24, 1985 (35 y.o. [name] Primary Care Physician: [name] Other Clinician: Referring Physician: Treating Physician/Extender: [name] in Treatment: 36 Education Assessment Education Provided To: Caregiver Education Topics Provided Elevated Blood Sugar/ Impact on Healing: Methods: Explain/Verbal Responses: State content correctly Nutrition: Methods: Explain/Verbal Responses: State content correctly Electronic Signature(s) Signed: 07/08/2020 5:28:50 PM By: Rhae Hammock RN Entered By: Rhae Hammock on 07/07/2020 16:38:08 -------------------------------------------------------------------------------- Wound Assessment Details Patient Name: Date of Service: [name], [name] 07/07/2020 4:00 PM Medical Record Number: [id number] Patient Account Number: [id number] Date of Birth/Sex: Treating RN: 05-May-1985 (35 y.o. [name] Primary Care Jandel Patriarca: [name] Other Clinician: Referring Jahquan Klugh: Treating Viyan Rosamond/Extender: [name] in Treatment: 36 Wound Status Wound Number: 3 Primary Etiology: Diabetic Wound/Ulcer of the Lower Extremity Wound Location: Left, Lateral Foot Wound Status: Open Wounding Event: Trauma Comorbid History: Type II Diabetes Date Acquired: 10/02/2019 Weeks Of Treatment: 36 Clustered Wound: No Photos Wound Measurements Length: (cm) 3 Width: (cm) 3.3 Depth: (cm) 0.3 Area: (cm) 7.775 Volume: (cm) 2.333 % Reduction in Area: -371.5% % Reduction in Volume: -1313.9% Epithelialization: Small (1-33%) Tunneling: No Undermining: No Wound Description Classification: Grade 2 Wound Margin: Thickened Exudate Amount: Large Exudate Type:  Serous Exudate Color: amber Foul Odor After Cleansing: No Slough/Fibrino No Wound Bed Granulation  Amount: Large (67-100%) Exposed Structure Granulation Quality: Pink Fascia Exposed: No Necrotic Amount: None Present (0%) Fat Layer (Subcutaneous Tissue) Exposed: Yes Tendon Exposed: No Muscle Exposed: No Joint Exposed: No Bone Exposed: No Treatment Notes Wound #3 (Foot) Wound Laterality: Left, Lateral Cleanser Normal Saline Discharge Instruction: Cleanse the wound with Normal Saline prior to applying a clean dressing using gauze sponges, not tissue or cotton balls. Soap and Water Discharge Instruction: May shower and wash wound with dial antibacterial soap and water prior to dressing change. Peri-Wound Care Zinc Oxide Ointment 30g tube Discharge Instruction: Apply Zinc Oxide to periwound with each dressing change as needed. Sween Lotion (Moisturizing lotion) Discharge Instruction: Apply moisturizing lotion as directed Topical Primary Dressing Hydrofera Blue Ready Foam, 2.5 x2.5 in Discharge Instruction: Apply to wound bed as instructed Secondary Dressing Woven Gauze Sponge, Non-Sterile 4x4 in Discharge Instruction: Apply over primary dressing as directed. ABD Pad, 8x10 Discharge Instruction: T down before applying kerlix. Apply over primary dressing as directed. ape Zetuvit Plus 4x8 in Discharge Instruction: Apply over primary dressing as directed. Secured With Compression Wrap Kerlix Roll 4.5x3.1 (in/yd) Discharge Instruction: Apply Kerlix and Coban compression as directed. Coban Self-Adherent Wrap 4x5 (in/yd) Discharge Instruction: Apply over Kerlix as directed. Compression Stockings Add-Ons Electronic Signature(s) Signed: 07/08/2020 5:44:43 PM By: [name] RN, BSN Signed: 07/09/2020 4:58:23 PM By: Sandre Kitty Entered By: Sandre Kitty on 07/08/2020 16:22:47 -------------------------------------------------------------------------------- Vitals Details Patient Name: Date of Service: [name], [name] 07/07/2020 4:00 PM Medical Record Number:  [id number] Patient Account Number: [id number] Date of Birth/Sex: Treating RN: [birth year] (35 y.o. [name] Primary Care Abella Shugart: [name] Other Clinician: Referring Ellanie Oppedisano: Treating Gunnard Dorrance/Extender: [name] in Treatment: 36 Vital Signs Time Taken: 15:55 Temperature (F): 98.3 Height (in): 77 Pulse (bpm): 96 Weight (lbs): 280 Respiratory Rate (breaths/min): 18 Body Mass Index (BMI): 33.2 Blood Pressure (mmHg): 139/83 Capillary Blood Glucose (mg/dl): 139 Reference Range: 80 - 120 mg / dl Notes glucose per pt report Electronic Signature(s) Signed: 07/08/2020 5:44:43 PM By: [name] RN, BSN Entered By: [name] on 07/07/2020 16:09:29

## 2020-07-14 ENCOUNTER — Other Ambulatory Visit: Payer: Self-pay

## 2020-07-14 ENCOUNTER — Encounter (HOSPITAL_BASED_OUTPATIENT_CLINIC_OR_DEPARTMENT_OTHER): Payer: BC Managed Care – PPO | Admitting: Internal Medicine

## 2020-07-14 DIAGNOSIS — E11621 Type 2 diabetes mellitus with foot ulcer: Secondary | ICD-10-CM

## 2020-07-14 DIAGNOSIS — L97518 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ity: Secondary | ICD-10-CM

## 2020-07-14 DIAGNOSIS — L97528 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ity: Secondary | ICD-10-CM

## 2020-07-16 NOTE — Progress Notes (Signed)
Garciaperez, [name] ([id number]) Visit Report for 07/14/2020 Arrival Information Details Patient Name: Date of Service: [name] 07/14/2020 2:15 PM Medical Record Number: [id number] Patient Account Number: [id number] Date of Birth/Sex: Treating RN: [birth year] (35 y.o. [name] Primary Care Provider: [name] Other Clinician: Referring Provider: Treating Provider/Extender: [name] in Treatment: 61 Visit Information History Since Last Visit Added or deleted any medications: No Patient Arrived: Ambulatory Any new allergies or adverse reactions: No Arrival Time: 15:19 Had a fall or experienced change in No Accompanied By: self activities of daily living that may affect Transfer Assistance: None risk of falls: Patient Identification Verified: Yes Signs or symptoms of abuse/neglect since last visito No Secondary Verification Process Completed: Yes Hospitalized since last visit: No Patient Requires Transmission-Based Precautions: No Implantable device outside of the clinic excluding No Patient Has Alerts: No cellular tissue based products placed in the center since last visit: Has Dressing in Place as Prescribed: Yes Has Footwear/Offloading in Place as Prescribed: Yes Left: T Contact Cast otal Pain Present Now: No Electronic Signature(s) Signed: 07/14/2020 5:56:52 PM By: Baruch Gouty RN, BSN Entered By: Baruch Gouty on 07/14/2020 15:21:50 -------------------------------------------------------------------------------- Encounter Discharge Information Details Patient Name: Date of Service: [name], [name] 07/14/2020 2:15 PM Medical Record Number: [id number] Patient Account Number: [id number] Date of Birth/Sex: Treating RN: 02-26-1986 (35 y.o. [name] Primary Care Provider: [name] Other Clinician: Referring Provider: Treating Provider/Extender: [name] in Treatment:  37 Encounter Discharge Information Items Post Procedure Vitals Discharge Condition: Stable Temperature (F): 98.4 Ambulatory Status: Ambulatory Pulse (bpm): 101 Discharge Destination: Home Respiratory Rate (breaths/min): 18 Transportation: Private Auto Blood Pressure (mmHg): 116/78 Schedule Follow-up Appointment: Yes Clinical Summary of Care: Provided on 07/14/2020 Form Type Recipient Paper Patient Patient Electronic Signature(s) Signed: 07/14/2020 4:57:03 PM By: [name] Entered By: [name] on 07/14/2020 16:57:03 -------------------------------------------------------------------------------- Lower Extremity Assessment Details Patient Name: Date of Service: [name] 07/14/2020 2:15 PM Medical Record Number: [id number] Patient Account Number: [id number] Date of Birth/Sex: Treating RN: 1985/09/18 (35 y.o. [name] Primary Care Provider: [name] Other Clinician: Referring Provider: Treating Provider/Extender: [name] in Treatment: 37 Edema Assessment Assessed: [name]: No] [name]: No] Edema: [Left: Ye] [Right: s] Calf Left: Right: Point of Measurement: 48 cm From Medial Instep 45.5 cm 47.4 cm Ankle Left: Right: Point of Measurement: 10 cm From Medial Instep 29.3 cm 31.3 cm Vascular Assessment Pulses: Dorsalis Pedis Palpable: [Left:Yes] [Right:Yes] Electronic Signature(s) Signed: 07/14/2020 5:56:52 PM By: Baruch Gouty RN, BSN Entered By: Baruch Gouty on 07/14/2020 15:41:29 -------------------------------------------------------------------------------- Multi Wound Chart Details Patient Name: Date of Service: [name], [name] 07/14/2020 2:15 PM Medical Record Number: [id number] Patient Account Number: [id number] Date of Birth/Sex: Treating RN: 1986-01-15 (35 y.o. [name], [name] Primary Care Provider: [name] Other Clinician: Referring Provider: Treating Provider/Extender: [name] in Treatment: 37 Vital Signs Height(in): 77 Capillary Blood Glucose(mg/dl): 120 Weight(lbs): 280 Pulse(bpm): 101 Body Mass Index(BMI): 33 Blood Pressure(mmHg): 116/78 Temperature(F): 98.4 Respiratory Rate(breaths/min): 18 Photos: [3:Left, Lateral Foot] [5:Right, Lateral Metatarsal head fifth Right, Anterior Ankle] Wound Location: [3:Trauma] [5:Gradually Appeared] [6:Gradually Appeared] Wounding Event: [3:Diabetic Wound/Ulcer of the Lower] [5:Diabetic Wound/Ulcer of the Lower] [6:Diabetic Wound/Ulcer of the Lower] Primary Etiology: [3:Extremity Type II Diabetes] [5:Extremity Type II Diabetes] [6:Extremity Type II Diabetes] Comorbid History: [3:10/02/2019] [5:07/07/2020] [6:06/02/2020] Date Acquired: [3:37] [5:0] [6:0] Weeks of Treatment: [3:Open] [5:Open] [6:Open] Wound Status: [3:3x2.7x0.1] [5:2.8x4x0.5] [  6:0.4x0.8x0.2] Measurements L x W x D (cm) [3:6.362] [5:8.796] [6:0.251] A (cm) : rea [3:0.636] [5:4.398] [6:0.05] Volume (cm) : [3:-285.80%] [5:0.00%] [6:0.00%] % Reduction in A [3:rea: -285.50%] [5:0.00%] [6:0.00%] % Reduction in Volume: [3:11] [5:5] Starting Position 1 (o'clock): [3:1] [5:12] Ending Position 1 (o'clock): [3:0.3] [5:0.8] Maximum Distance 1 (cm): [3:Yes] [5:Yes] [6:No] Undermining: [3:Grade 2] [5:Grade 2] [6:Grade 1] Classification: [3:Large] [5:Medium] [6:Medium] Exudate A mount: [3:Serosanguineous] [5:Serosanguineous] [6:Serosanguineous] Exudate Type: [3:red, brown] [5:red, brown] [6:red, brown] Exudate Color: [3:Flat and Intact] [5:Well defined, not attached] [6:Distinct, outline attached] Wound Margin: [3:Large (67-100%)] [5:Medium (34-66%)] [6:Medium (34-66%)] Granulation A mount: [3:Red] [5:Red] [6:Red] Granulation Quality: [3:None Present (0%)] [5:Small (1-33%)] [6:Medium (34-66%)] Necrotic A mount: [3:Fat Layer (Subcutaneous Tissue): Yes Fat Layer (Subcutaneous Tissue): Yes Fat Layer (Subcutaneous Tissue): Yes] Exposed  Structures: [3:Fascia: No Tendon: No Muscle: No Joint: No Bone: No Small (1-33%)] [5:Fascia: No Tendon: No Muscle: No Joint: No Bone: No Small (1-33%)] [6:Fascia: No Tendon: No Muscle: No Joint: No Bone: No Small (1-33%)] Epithelialization: [3:Debridement - Selective/Open Wound Debridement - Excisional] [6:Chemical/Enzymatic/Mechanical] Debridement: Pre-procedure Verification/Time Out 16:05 [5:16:05] [6:N/A] Taken: [3:Lidocaine 4% Topical Solution] [5:Lidocaine 4% Topical Solution] [6:N/A] Pain Control: [3:Callus] [5:Callus, Subcutaneous] [6:N/A] Tissue Debrided: [3:Non-Viable Tissue] [5:Skin/Subcutaneous Tissue] [6:N/A] Level: [3:1.5] [5:12] [6:N/A] Debridement A (sq cm): [3:rea Curette] [5:Curette, Forceps, Scissors] [6:N/A] Instrument: [3:Moderate] [5:Moderate] [6:None] Bleeding: [3:Pressure] [5:Pressure] [6:N/A] Hemostasis A chieved: [3:0] [5:0] [6:N/A] Procedural Pain: [3:0] [5:0] [6:N/A] Post Procedural Pain: [3:Procedure was tolerated well] [5:Procedure was tolerated well] [6:Procedure was tolerated well] Debridement Treatment Response: [3:3x2.7x0.2] [5:3x4x0.5] [6:0.4x0.8x0.2] Post Debridement Measurements L x W x D (cm) [3:1.272] [5:4.712] [6:0.05] Post Debridement Volume: (cm) [3:Debridement] [5:Debridement] [6:Debridement] Procedures Performed: [3:T Contact Cast otal] Treatment Notes Electronic Signature(s) Signed: 07/14/2020 5:15:42 PM By: [name] DO Signed: 07/16/2020 5:45:48 PM By: Rhae Hammock RN Entered By: [name] on 07/14/2020 16:44:25 -------------------------------------------------------------------------------- Multi-Disciplinary Care Plan Details Patient Name: Date of Service: [name], [name] 07/14/2020 2:15 PM Medical Record Number: [id number] Patient Account Number: [id number] Date of Birth/Sex: Treating RN: 12-31-1985 (35 y.o. [name] Primary Care Provider: [name] Other Clinician: Referring Provider: Treating  Provider/Extender: [name] in Treatment: 76 Multidisciplinary Care Plan reviewed with physician Active Inactive Nutrition Nursing Diagnoses: Imbalanced nutrition Potential for alteratiion in Nutrition/Potential for imbalanced nutrition Goals: Patient/caregiver agrees to and verbalizes understanding of need to use nutritional supplements and/or vitamins as prescribed Date Initiated: 10/24/2019 Date Inactivated: 04/06/2020 Target Resolution Date: 04/03/2020 Goal Status: Met Patient/caregiver will maintain therapeutic glucose control Date Initiated: 10/24/2019 Target Resolution Date: 08/18/2020 Goal Status: Active Interventions: Assess HgA1c results as ordered upon admission and as needed Assess patient nutrition upon admission and as needed per policy Provide education on elevated blood sugars and impact on wound healing Provide education on nutrition Treatment Activities: Education provided on Nutrition : 07/07/2020 Notes: Electronic Signature(s) Signed: 07/14/2020 6:06:00 PM By: Deon Pilling Entered By: Deon Pilling on 07/14/2020 15:13:43 -------------------------------------------------------------------------------- Pain Assessment Details Patient Name: Date of Service: [name] 07/14/2020 2:15 PM Medical Record Number: [id number] Patient Account Number: [id number] Date of Birth/Sex: Treating RN: 08-08-1985 (35 y.o. [name] Primary Care Provider: [name] Other Clinician: Referring Provider: Treating Provider/Extender: [name] in Treatment: 37 Active Problems Location of Pain Severity and Description of Pain Patient Has Paino No Site Locations Rate the pain. Current Pain Level: 0 Pain Management and Medication Current Pain Management: Electronic Signature(s) Signed: 07/14/2020 5:56:52 PM By: Baruch Gouty RN, BSN  Entered By: Baruch Gouty on 07/14/2020  15:25:17 -------------------------------------------------------------------------------- Patient/Caregiver Education Details Patient Name: Date of Service: [name] 4/12/2022andnbsp2:15 PM Medical Record Number: [id number] Patient Account Number: [id number] Date of Birth/Gender: Treating RN: 1985-06-14 (35 y.o. [name] Primary Care Physician: [name] Other Clinician: Referring Physician: Treating Physician/Extender: [name] in Treatment: 77 Education Assessment Education Provided To: Patient Education Topics Provided Elevated Blood Sugar/ Impact on Healing: Handouts: Elevated Blood Sugars: How Do They Affect Wound Healing Methods: Explain/Verbal Responses: Reinforcements needed Electronic Signature(s) Signed: 07/14/2020 6:06:00 PM By: Deon Pilling Entered By: Deon Pilling on 07/14/2020 15:13:56 -------------------------------------------------------------------------------- Wound Assessment Details Patient Name: Date of Service: [name] 07/14/2020 2:15 PM Medical Record Number: [id number] Patient Account Number: [id number] Date of Birth/Sex: Treating RN: Sep 13, 1985 (35 y.o. [name] Primary Care Provider: [name] Other Clinician: Referring Provider: Treating Provider/Extender: [name] in Treatment: 37 Wound Status Wound Number: 3 Primary Etiology: Diabetic Wound/Ulcer of the Lower Extremity Wound Location: Left, Lateral Foot Wound Status: Open Wounding Event: Trauma Comorbid History: Type II Diabetes Date Acquired: 10/02/2019 Weeks Of Treatment: 37 Clustered Wound: No Photos Wound Measurements Length: (cm) 3 Width: (cm) 2.7 Depth: (cm) 0.1 Area: (cm) 6.362 Volume: (cm) 0.636 % Reduction in Area: -285.8% % Reduction in Volume: -285.5% Epithelialization: Small (1-33%) Tunneling: No Undermining: Yes Starting Position (o'clock): 11 Ending  Position (o'clock): 1 Maximum Distance: (cm) 0.3 Wound Description Classification: Grade 2 Wound Margin: Flat and Intact Exudate Amount: Large Exudate Type: Serosanguineous Exudate Color: red, brown Foul Odor After Cleansing: No Slough/Fibrino No Wound Bed Granulation Amount: Large (67-100%) Exposed Structure Granulation Quality: Red Fascia Exposed: No Necrotic Amount: None Present (0%) Fat Layer (Subcutaneous Tissue) Exposed: Yes Tendon Exposed: No Muscle Exposed: No Joint Exposed: No Bone Exposed: No Treatment Notes Wound #3 (Foot) Wound Laterality: Left, Lateral Cleanser Soap and Water Discharge Instruction: May shower and wash wound with dial antibacterial soap and water prior to dressing change. Peri-Wound Care Zinc Oxide Ointment 30g tube Discharge Instruction: Apply Zinc Oxide to periwound with each dressing change as needed. Topical Primary Dressing Hydrofera Blue Classic Foam, 4x4 in Discharge Instruction: Moisten with saline prior to applying to wound bed Secondary Dressing Woven Gauze Sponge, Non-Sterile 4x4 in Discharge Instruction: Apply over primary dressing as directed. ABD Pad, 8x10 Discharge Instruction: T down before applying kerlix. Apply over primary dressing as directed. ape Zetuvit Plus 4x8 in Discharge Instruction: Apply over primary dressing as directed. Secured With [employer], 4.5x3.1 (in/yd) Discharge Instruction: Secure with Kerlix as directed. Compression Wrap Compression Stockings Add-Ons Electronic Signature(s) Signed: 07/14/2020 5:56:52 PM By: Baruch Gouty RN, BSN Signed: 07/15/2020 8:29:36 AM By: Sandre Kitty Entered By: Sandre Kitty on 07/14/2020 16:40:38 -------------------------------------------------------------------------------- Wound Assessment Details Patient Name: Date of Service: [name], [name] 07/14/2020 2:15 PM Medical Record Number: [id number] Patient Account Number: [id number] Date of  Birth/Sex: Treating RN: 1986/01/31 (35 y.o. [name] Primary Care Provider: [name] Other Clinician: Referring Provider: Treating Provider/Extender: [name] in Treatment: 37 Wound Status Wound Number: 5 Primary Etiology: Diabetic Wound/Ulcer of the Lower Extremity Wound Location: Right, Lateral Metatarsal head fifth Wound Status: Open Wounding Event: Gradually Appeared Comorbid History: Type II Diabetes Date Acquired: 07/07/2020 Weeks Of Treatment: 0 Clustered Wound: No Photos Wound Measurements Length: (cm) 2.8 Width: (cm) 4 Depth: (cm) 0.5 Area: (cm) 8.796 Volume: (cm) 4.398 % Reduction in Area: 0% % Reduction in Volume: 0% Epithelialization:  Small (1-33%) Tunneling: No Undermining: Yes Starting Position (o'clock): 5 Ending Position (o'clock): 12 Maximum Distance: (cm) 0.8 Wound Description Classification: Grade 2 Wound Margin: Well defined, not attached Exudate Amount: Medium Exudate Type: Serosanguineous Exudate Color: red, brown Foul Odor After Cleansing: No Slough/Fibrino Yes Wound Bed Granulation Amount: Medium (34-66%) Exposed Structure Granulation Quality: Red Fascia Exposed: No Necrotic Amount: Small (1-33%) Fat Layer (Subcutaneous Tissue) Exposed: Yes Necrotic Quality: Adherent Slough Tendon Exposed: No Muscle Exposed: No Joint Exposed: No Bone Exposed: No Treatment Notes Wound #5 (Metatarsal head fifth) Wound Laterality: Right, Lateral Cleanser Soap and Water Discharge Instruction: May shower and wash wound with dial antibacterial soap and water prior to dressing change. Byram Ancillary Kit - 15 Day Supply Discharge Instruction: Use supplies as instructed; Kit contains: (15) Saline Bullets; (15) 3x3 Gauze; 15 pr Gloves Peri-Wound Care Skin Prep Discharge Instruction: Use skin prep as directed Topical Primary Dressing Promogran Prisma Matrix, 4.34 (sq in) (silver collagen) Discharge Instruction:  Moisten collagen with saline or hydrogel Secondary Dressing ComfortFoam Border, 4x4 in (silicone border) Discharge Instruction: Apply over primary dressing as directed. Secured With Compression Wrap Compression Stockings Environmental education officer) Signed: 07/14/2020 5:56:52 PM By: Baruch Gouty RN, BSN Signed: 07/15/2020 8:29:36 AM By: Sandre Kitty Entered By: Sandre Kitty on 07/14/2020 16:40:10 -------------------------------------------------------------------------------- Wound Assessment Details Patient Name: Date of Service: [name], [name] 07/14/2020 2:15 PM Medical Record Number: [id number] Patient Account Number: [id number] Date of Birth/Sex: Treating RN: 04-10-1985 (35 y.o. [name] Primary Care Thorne Wirz: [name] Other Clinician: Referring Lakrista Scaduto: Treating Kalliope Riesen/Extender: [name] in Treatment: 37 Wound Status Wound Number: 6 Primary Etiology: Diabetic Wound/Ulcer of the Lower Extremity Wound Location: Right, Anterior Ankle Wound Status: Open Wounding Event: Gradually Appeared Comorbid History: Type II Diabetes Date Acquired: 06/02/2020 Weeks Of Treatment: 0 Clustered Wound: No Photos Wound Measurements Length: (cm) 0.4 Width: (cm) 0.8 Depth: (cm) 0.2 Area: (cm) 0.251 Volume: (cm) 0.05 % Reduction in Area: 0% % Reduction in Volume: 0% Epithelialization: Small (1-33%) Tunneling: No Undermining: No Wound Description Classification: Grade 1 Wound Margin: Distinct, outline attached Exudate Amount: Medium Exudate Type: Serosanguineous Exudate Color: red, brown Foul Odor After Cleansing: No Slough/Fibrino Yes Wound Bed Granulation Amount: Medium (34-66%) Exposed Structure Granulation Quality: Red Fascia Exposed: No Necrotic Amount: Medium (34-66%) Fat Layer (Subcutaneous Tissue) Exposed: Yes Necrotic Quality: Adherent Slough Tendon Exposed: No Muscle Exposed: No Joint Exposed: No Bone  Exposed: No Treatment Notes Wound #6 (Ankle) Wound Laterality: Right, Anterior Cleanser Soap and Water Discharge Instruction: May shower and wash wound with dial antibacterial soap and water prior to dressing change. Byram Ancillary Kit - 15 Day Supply Discharge Instruction: Use supplies as instructed; Kit contains: (15) Saline Bullets; (15) 3x3 Gauze; 15 pr Gloves Peri-Wound Care Skin Prep Discharge Instruction: Use skin prep as directed Topical Primary Dressing Promogran Prisma Matrix, 4.34 (sq in) (silver collagen) Discharge Instruction: Moisten collagen with saline or hydrogel Secondary Dressing ComfortFoam Border, 4x4 in (silicone border) Discharge Instruction: Apply over primary dressing as directed. Secured With Compression Wrap Compression Stockings Environmental education officer) Signed: 07/14/2020 5:56:52 PM By: Baruch Gouty RN, BSN Signed: 07/15/2020 8:29:36 AM By: Sandre Kitty Entered By: Sandre Kitty on 07/14/2020 16:39:11 -------------------------------------------------------------------------------- Vitals Details Patient Name: Date of Service: [name], [name] 07/14/2020 2:15 PM Medical Record Number: [id number] Patient Account Number: [id number] Date of Birth/Sex: Treating RN: [birth year] (35 y.o. [name] Primary Care Herman Fiero: [name] Other Clinician: Referring Eldwin Volkov: Treating Tiffiny Worthy/Extender: Mellody Memos,  Lowell Bouton in Treatment: 37 Vital Signs Time Taken: 15:22 Temperature (F): 98.4 Height (in): 77 Pulse (bpm): 101 Source: Stated Respiratory Rate (breaths/min): 18 Weight (lbs): 280 Blood Pressure (mmHg): 116/78 Source: Stated Capillary Blood Glucose (mg/dl): 120 Body Mass Index (BMI): 33.2 Reference Range: 80 - 120 mg / dl Electronic Signature(s) Signed: 07/14/2020 5:56:52 PM By: Baruch Gouty RN, BSN Entered By: Baruch Gouty on 07/14/2020 15:25:09

## 2020-07-21 ENCOUNTER — Encounter (HOSPITAL_BASED_OUTPATIENT_CLINIC_OR_DEPARTMENT_OTHER): Payer: BC Managed Care – PPO | Admitting: Internal Medicine

## 2020-07-21 ENCOUNTER — Other Ambulatory Visit: Payer: Self-pay

## 2020-07-21 DIAGNOSIS — L97518 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ity: Secondary | ICD-10-CM

## 2020-07-21 DIAGNOSIS — E11621 Type 2 diabetes mellitus with foot ulcer: Secondary | ICD-10-CM | POA: Diagnosis not present

## 2020-07-27 ENCOUNTER — Other Ambulatory Visit: Payer: Self-pay

## 2020-07-27 ENCOUNTER — Encounter (HOSPITAL_BASED_OUTPATIENT_CLINIC_OR_DEPARTMENT_OTHER): Payer: BC Managed Care – PPO | Admitting: Internal Medicine

## 2020-07-27 DIAGNOSIS — E11621 Type 2 diabetes mellitus with foot ulcer: Secondary | ICD-10-CM | POA: Diagnosis not present

## 2020-07-27 NOTE — Progress Notes (Signed)
[name], [name] ([id number]) Visit Report for 07/27/2020 Chief Complaint Document Details Patient Name: Date of Service: [name] D 07/27/2020 9:30 A M Medical Record Number: [id number] Patient Account Number: [id number] Date of Birth/Sex: Treating RN: Feb 01, 1986 (35 y.o. [name] Primary Care Provider: [name] Other Clinician: Referring Provider: Treating Provider/Extender: [name] in Treatment: 39 Information Obtained from: Patient Chief Complaint 01/11/2019; patient is here for review of a rather substantial wound over the left fifth plantar metatarsal head extending into the lateral part of his foot 10/24/2019; patient returns to clinic with wounds on his bilateral feet with underlying osteomyelitis biopsy-proven Electronic Signature(s) Signed: 07/27/2020 12:43:16 PM By: [name] DO Entered By: [name] on 07/27/2020 12:35:43 -------------------------------------------------------------------------------- Debridement Details Patient Name: Date of Service: [name], [name] 07/27/2020 9:30 A M Medical Record Number: [id number] Patient Account Number: [id number] Date of Birth/Sex: Treating RN: 1985-05-08 (35 y.o. [name] Primary Care Provider: [name] Other Clinician: Referring Provider: Treating Provider/Extender: [name] in Treatment: 39 Debridement Performed for Assessment: Wound #3 Left,Lateral Foot Performed By: Physician [name], DO Debridement Type: Debridement Severity of Tissue Pre Debridement: Fat layer exposed Level of Consciousness (Pre-procedure): Awake and Alert Pre-procedure Verification/Time Out Yes - 11:05 Taken: Start Time: 11:05 Pain Control: Other : benzocaine 20% spray T Area Debrided (L x W): otal 2.5 (cm) x 2.7 (cm) = 6.75 (cm) Tissue and other material debrided: Viable, Non-Viable, Callus, Slough, Subcutaneous, Slough Level:  Skin/Subcutaneous Tissue Debridement Description: Excisional Instrument: Curette Bleeding: Minimum Hemostasis Achieved: Pressure End Time: 11:13 Procedural Pain: 0 Post Procedural Pain: 0 Response to Treatment: Procedure was tolerated well Level of Consciousness (Post- Awake and Alert procedure): Post Debridement Measurements of Total Wound Length: (cm) 2.5 Width: (cm) 2.7 Depth: (cm) 0.1 Volume: (cm) 0.53 Character of Wound/Ulcer Post Debridement: Improved Severity of Tissue Post Debridement: Fat layer exposed Post Procedure Diagnosis Same as Pre-procedure Electronic Signature(s) Signed: 07/27/2020 12:43:16 PM By: [name] DO Signed: 07/27/2020 4:57:45 PM By: Baruch Gouty RN, BSN Entered By: Baruch Gouty on 07/27/2020 11:11:57 -------------------------------------------------------------------------------- Debridement Details Patient Name: Date of Service: [name], [name] 07/27/2020 9:30 A M Medical Record Number: [id number] Patient Account Number: [id number] Date of Birth/Sex: Treating RN: 02-Jul-1985 (35 y.o. [name] Primary Care Provider: [name] Other Clinician: Referring Provider: Treating Provider/Extender: [name] in Treatment: 39 Debridement Performed for Assessment: Wound #5 Right,Lateral Metatarsal head fifth Performed By: Physician [name], DO Debridement Type: Debridement Severity of Tissue Pre Debridement: Fat layer exposed Level of Consciousness (Pre-procedure): Awake and Alert Pre-procedure Verification/Time Out Yes - 11:05 Taken: Start Time: 11:05 Pain Control: Other : benzocaine 20% spray T Area Debrided (L x W): otal 3 (cm) x 3.5 (cm) = 10.5 (cm) Tissue and other material debrided: Viable, Non-Viable, Callus, Subcutaneous, Skin: Epidermis Level: Skin/Subcutaneous Tissue Debridement Description: Excisional Instrument: Curette Bleeding: Minimum Hemostasis Achieved:  Pressure End Time: 11:13 Procedural Pain: 0 Post Procedural Pain: 0 Response to Treatment: Procedure was tolerated well Level of Consciousness (Post- Awake and Alert procedure): Post Debridement Measurements of Total Wound Length: (cm) 3 Width: (cm) 3.5 Depth: (cm) 0.5 Volume: (cm) 4.123 Character of Wound/Ulcer Post Debridement: Improved Severity of Tissue Post Debridement: Fat layer exposed Post Procedure Diagnosis Same as Pre-procedure Electronic Signature(s) Signed: 07/27/2020 12:43:16 PM By: [name] DO Signed: 07/27/2020 4:57:45 PM By: Baruch Gouty RN, BSN Entered By: Baruch Gouty on 07/27/2020 11:13:41 -------------------------------------------------------------------------------- HPI Details Patient Name:  Date of Service: [name] D 07/27/2020 9:30 A M Medical Record Number: [id number] Patient Account Number: [id number] Date of Birth/Sex: Treating RN: Dec 25, 1985 (35 y.o. 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 He comes in for review of a wound over the plantar fifth metatarsal head extending into the lateral part of the foot. He was followed for this previously by his podiatrist Dr. Cornelius Moras. As the patient tells his story he went to see podiatry first for a swelling he developed on the lateral part of his fifth metatarsal head in May. He states this was "open" by podiatry and the area closed. He was followed up in June and it was again opened callus removed and it closed promptly. There were plans being made for surgery on the fifth metatarsal head in June however his blood sugar was apparently too high for anesthesia. Apparently the area was debrided and opened again in  June and it is never closed since. Looking over the records from podiatry I am really not able to follow this. It was clear when he was first seen it was before 5/14 at that point he already had a wound. By 5/17 the ulcer was resolved. I do not see anything about a procedure. On 5/28 noted to have pre-ulcerative moderate keratosis. X-ray noted 1/5 contracted toe and tailor's bunion and metatarsal deformity. On a visit date on 09/28/2018 the dorsal part of the left foot it healed and resolved. There was concern about swelling in his lower extremity he was sent to the ER.. As far as I can tell he was seen in the ER on 7/12 with an ulcer on his left foot. A DVT rule out of the left leg was negative. I do not think I have complete records from podiatry but I am not able to verify the procedures this patient states he had. He states after the last procedure the wound has never closed although I am not able to follow this in the records I have from podiatry. He has not had a recent x-ray The patient has been using Neosporin on the wound. He is wearing a Darco shoe. He is still very active up on his foot working and exercising. Past medical history; type 2 diabetes ketosis-prone, leg swelling with a negative DVT study in July. Non-smoker ABI in our clinic was 0.85 on the left 10/16; substantial wound on the plantar left fifth met head extending laterally almost to the dorsal fifth MTP. We have been using silver alginate we gave him a Darco forefoot off loader. An x-ray did not show evidence of osteomyelitis did note soft tissue emphysema which I think was due to gas tracking through an open wound. There is no doubt in my mind he requires an MRI 10/23; MRI not booked until 3 November at the earliest this is largely due to his glucose sensor in the right arm. We have been using silver alginate. There has been an improvement 10/29; I am still not exactly sure when his MRI is booked for. He says it is the third  but it is the 10th in epic. This definitely needs to be done. He is running a low-grade fever today but no other symptoms. No real improvement in the 1 02/26/2019 patient presents today for a  follow-up visit here in our clinic he is last been seen in the clinic on October 29. Subsequently we were working on getting MRI to evaluate and see what exactly was going on and where we would need to go from the standpoint of whether or not he had osteomyelitis and again what treatments were going be required. Subsequently the patient ended up being admitted to the hospital on 02/07/2019 and was discharged on 02/14/2019. This is a somewhat interesting admission with a discharge diagnosis of pneumonia due to COVID-19 although he was positive for COVID-19 when tested at the urgent care but negative x2 when he was actually in the hospital. With that being said he did have acute respiratory failure with hypoxia and it was noted he also have a left foot ulceration with osteomyelitis. With that being said he did require oxygen for his pneumonia and I level 4 L. He was placed on antivirals and steroids for the COVID-19. He was also transferred to the [facility] at one point. Nonetheless he did subsequently discharged home and since being home has done much better in that regard. The CT angiogram did not show any pulmonary embolism. With regard to the osteomyelitis the patient was placed on vancomycin and Zosyn while in the hospital but has been changed to Augmentin at discharge. It was also recommended that he follow- up with wound care and podiatry. Podiatry however wanted him to see [name] according to the patient prior to them doing anything further. His hemoglobin A1c was 9.9 as noted in the hospital. Have an MRI of the left foot performed while in the hospital on 02/04/2019. 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  There is an overlying plantar open wound noted an abscess tracking back along the lateral aspect of the fifth metatarsal shaft. There is otherwise diffuse cellulitis and mild fasciitis without findings of polymyositis. The patient did have recently pneumonia secondary to COVID-19 I looked in the chart through epic and it does appear that the patient may need to have an additional x-ray just to ensure everything is cleared and that he has no airspace disease prior to putting him into the [name]. 03/05/2019; patient was readmitted to the clinic last week. He was hospitalized twice for a viral upper respiratory tract infection from 11/1 through 11/4 and then 11/5 through 11/12 ultimately this turned out to be Covid pneumonitis. Although he was discharged on oxygen he is not using it. He says he feels fine. He has no exercise limitation no cough no sputum. His O2 sat in our clinic today was 100% on room air. 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 He received Vanco and Zosyn in the hospital and then was discharged on 2 weeks of Augmentin. I do not see any relevant cultures. He was supposed to follow-up with infectious disease but I do not see that he has an appointment. 12/8; patient saw Dr. Novella Olive of infectious disease last week. He felt that he had had adequate antibiotic therapy. He did not go to follow-up with Dr. Amalia Hailey of podiatry and I have again talked to him about the pros and cons of this. He does not want to consider a ray amputation of this time. He is aware of the risks of recurrence, migration etc. He started HBO today and tolerated this  well. He can complete the Augmentin that I gave him last week. I have looked over the lab work that Dr. Chana Bode ordered his C-reactive protein was 3.3 and his sedimentation rate was 17. The C-reactive protein is never really been  measurably that high in this patient 12/15; not much change in the wound today however he has undermining along the lateral part of the foot again more extensively than last week. He has some rims of epithelialization. We have been using silver alginate. He is undergoing hyperbarics but did not dive today 12/18; in for his obligatory first total contact cast change. Unfortunately there was pus coming from the undermining area around his fifth metatarsal head. This was cultured but will preclude reapplication of a cast. He is seen in conjunction with HBO 12/24; patient had staph lugdunensis in the wound in the undermining area laterally last time. We put him on doxycycline which should have covered this. The wound looks better today. I am going to give him another week of doxycycline before reattempting the total contact cast 12/31; the patient is completing antibiotics. Hemorrhagic debris in the distal part of the wound with some undermining distally. He also had hyper granulation. Extensive debridement with a #5 curette. The infected area that was on the lateral part of the fifth met head is closed over. I do not think he needs any more antibiotics. Patient was seen prior to HBO. Preparations for a total contact cast were made in the cast will be placed post hyperbarics 04/11/19; once again the patient arrives today without complaint. He had been in a cast all week noted that he had heavy drainage this week. This resulted in large raised areas of macerated tissue around the wound 1/14; wound bed looks better slightly smaller. Hydrofera Blue has been changing himself. He had a heavy drainage last week which caused a lot of maceration around the wound so I took him out of a total contact cast he says the drainage is actually better this week He is seen today in conjunction with HBO 1/21; returns to clinic. He was up in [place] for a day or 2 attending a funeral. He comes back in with the wound larger  and with a large area of exposed bone. He had osteomyelitis and septic arthritis of the fifth left metatarsal head while he was in hospital. He received IV antibiotics in the hospital for a prolonged period of time then 3 weeks of Augmentin. Subsequently I gave him 2 weeks of doxycycline for more superficial wound infection. When I saw this last week the wound was smaller the surface of the wound looks satisfactory. 1/28; patient missed hyperbarics today. Bone biopsy I did last time showed Enterococcus faecalis and Staphylococcus lugdunensis . He has a wide area of exposed bone. We are going to use silver alginate as of today. I had another ethical discussion with the patient. 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 In this situation I think the likelihood of healing this is low. Therefore I have recommended a ray amputation and with the patient's agreement I have referred him to Dr. Doran Durand. The other issue is that his compliance with hyperbarics has been minimal because of his work schedule and given his underlying decision I am going to stop this today READMISSION 10/24/2019 MRI 09/29/2019 left foot IMPRESSION: 1. Apparent skin ulceration inferior and lateral to the 5th metatarsal base with underlying heterogeneous T2 signal and enhancement in the subcutaneous fat. Small peripherally enhancing fluid  collections along the plantar and lateral aspects of the 5th metatarsal base suspicious for abscesses. 2. Interval amputation through the mid 5th metatarsal with nonspecific low-level marrow edema and enhancement. Given the proximity to the adjacent soft tissue inflammatory changes, osteomyelitis cannot be excluded. 3. The additional bones appear unremarkable. MRI 09/29/2019 right foot IMPRESSION: 1. Soft tissue ulceration lateral to the 5th MTP joint. There is low-level T2 hyperintensity within the 4th and 5th metatarsal heads and adjacent proximal  phalanges without abnormal T1 signal or cortical destruction. These findings are nonspecific and could be seen with early marrow edema, hyperemia or early osteomyelitis. No evidence of septic joint. 2. Mild tenosynovitis and synovial enhancement associated with the extensor digitorum tendons at the level of the midfoot. 3. Diffuse low-level muscular T2 hyperintensity and enhancement, most consistent with diabetic myopathy. LEFT FOOT BONE Methicillin resistant staphylococcus aureus Staphylococcus lugdunensis MIC MIC CIPROFLOXACIN >=8 RESISTANT Resistant <=0.5 SENSI... Sensitive CLINDAMYCIN <=0.25 SENS... Sensitive >=8 RESISTANT Resistant ERYTHROMYCIN >=8 RESISTANT Resistant >=8 RESISTANT Resistant GENTAMICIN <=0.5 SENSI... Sensitive <=0.5 SENSI... Sensitive Inducible Clindamycin NEGATIVE Sensitive NEGATIVE Sensitive OXACILLIN >=4 RESISTANT Resistant 2 SENSITIVE Sensitive RIFAMPIN <=0.5 SENSI... Sensitive <=0.5 SENSI... Sensitive TETRACYCLINE <=1 SENSITIVE Sensitive <=1 SENSITIVE Sensitive TRIMETH/SULFA <=10 SENSIT Sensitive <=10 SENSIT Sensitive ... Marland Kitchen.. VANCOMYCIN 1 SENSITIVE Sensitive <=0.5 SENSI... Sensitive Right foot bone . Component 3 wk ago Specimen Description BONE Special Requests RIGHT 4 METATARSAL SAMPLE B Gram Stain NO WBC SEEN NO ORGANISMS SEEN Culture RARE METHICILLIN RESISTANT STAPHYLOCOCCUS AUREUS NO ANAEROBES ISOLATED Performed at [facility], [place] [address], [place], [place] [address] Report Status 10/08/2019 FINAL Organism ID, Bacteria METHICILLIN RESISTANT STAPHYLOCOCCUS AUREUS Resulting Agency [facility] Susceptibility Methicillin resistant staphylococcus aureus MIC CIPROFLOXACIN >=8 RESISTANT Resistant CLINDAMYCIN <=0.25 SENS... Sensitive ERYTHROMYCIN >=8 RESISTANT Resistant GENTAMICIN <=0.5 SENSI... Sensitive Inducible Clindamycin NEGATIVE Sensitive OXACILLIN >=4 RESISTANT Resistant RIFAMPIN <=0.5 SENSI... Sensitive TETRACYCLINE <=1 SENSITIVE  Sensitive TRIMETH/SULFA <=10 SENSIT Sensitive ... VANCOMYCIN 1 SENSITIVE Sensitive This is a patient we had in clinic earlier this year with a wound over his left fifth metatarsal head. 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 Doran Durand on 05/16/2019. He seems to have developed problems with wounds on his bilateral feet in June 2021 although he may have had problems earlier than this. He was in an urgent care with a right foot ulcer on 09/26/2019 and given a course of doxycycline. This was apparently after having trouble getting into see orthopedics. He was seen by podiatry on 09/28/2019 noted to have bilateral lower extremity ulcers including the left lateral fifth metatarsal base and the right subfifth met head. It was noted that had purulent drainage at that time. He required hospitalization from 6/20 through 7/2. This was because of worsening right foot wounds. He underwent bilateral operative incision and drainage and bone biopsies bilaterally. Culture results are listed above. He has been referred back to clinic by Dr. Jacqualyn Posey of podiatry. He is also followed by Dr. Megan Salon who saw him yesterday. He was discharged from hospital on Zyvox Flagyl and Levaquin and yesterday changed to doxycycline Flagyl and Levaquin. His inflammatory markers on 6/26 showed a sedimentation rate of 129 and a C-reactive protein of 5. This is improved to 14 and 1.3 respectively. This would indicate improvement.  ABIs in our clinic today were 1.23 on the right and 1.20 on the left 11/01/2019 on evaluation today patient appears to be doing fairly well in regard to the wounds on his feet at this point. Fortunately there is no signs of  active infection at this time. No fevers, chills, nausea, vomiting, or diarrhea. He currently is seeing infectious disease and still under their care at this point. Subsequently he also has both wounds which she has not been using collagen on as he did not receive that in his packaging he did not call us and let us know that. Apparently that just was missed on the order. Nonetheless we will get that straightened out today. 8/9-Patient returns for bilateral foot wounds, using Prisma with hydrogel moistened dressings, and the wounds appear stable. Patient using surgical shoes, avoiding much pressure or weightbearing as much as possible 8/16; patient has bilateral foot wounds. 1 on the right lateral foot proximally the other is on the left mid lateral foot. Both required debridement of callus and thick skin around the wounds. We have been using silver collagen 8/27; patient has bilateral lateral foot wounds. The area on the left substantially surrounded by callus and dry skin. This was removed from the wound edge. The underlying wound is small. The area on the right measured somewhat smaller today. 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 Unfortunately I did not update his antibiotics during today's visit. 9/10 I reviewed Dr. Hale Bogus last notes he felt he had completed antibiotics his inflammatory markers were reasonably well controlled. He has a small wound on the lateral left foot and a tiny area on the right which is just above closed. He is using Hydrofera Blue with border foam he has bilateral surgical shoes 9/24; 2 week f/u. doing well. right foot is closed. left foot still undermined. 10/14; right foot remains closed at the fifth met head. The area over the base of the left fifth metatarsal has a small open area but considerable undermining towards the plantar foot. Thick callus skin around this suggests an adequate pressure  relief. We have talked about this. He says he is going to go back into his cam boot. I suggested a total contact cast he did not seem enamored with this suggestion 10/26; left foot base of the fifth metatarsal. Same condition as last time. He has skin over the area with an open wound however the skin is not adherent. He went to see Dr. Earleen Newport who did an x-ray and culture of his foot I have not reviewed the x-ray but the patient was not told anything. He is on doxycycline 11/11; since the patient was last here he was in the emergency room on 10/30 he was concerned about swelling in the left foot. They did not do any cultures or x-rays. They changed his antibiotics to cephalexin. Previous culture showed group B strep. The cephalexin is appropriate as doxycycline has less than predictable coverage. Arrives in clinic today with swelling over this area under the wound. He also has a new wound on the right fifth metatarsal head 11/18; the patient has a difficult wound on the lateral aspect of the left fifth metatarsal head. The wound was almost ballotable last week I opened it slightly expecting to see purulence however there was just bleeding. I cultured this this was negative. X-ray unchanged. We are trying to get an MRI but I am not sure were going to be able  to get this through his insurance. He also has an area on the right lateral fifth metatarsal head this looks healthier 12/3; the patient finally got our MRI. Surprisingly this did not show osteomyelitis. I did show the soft tissue ulceration at the lateral plantar aspect of the fifth metatarsal base with a tiny residual 6 mm abscess overlying the superficial fascia I have tried to culture this area I have not been able to get this to grow anything. Nevertheless the protruding tissue looks aggravated. I suspect we should try to treat the underlying "abscess with broad-spectrum antibiotics. I am going to start him on Levaquin and Flagyl. He has much  less edema in his legs and I am going to continue to wrap his legs and see him weekly 12/10. I started Levaquin and Flagyl on him last week. He just picked up the Flagyl apparently there was some delay. The worry is the wound on the left fifth metatarsal base which is substantial and worsening. His foot looks like he inverts at the ankle making this a weightbearing surface. Certainly no improvement in fact I think the measurements of this are somewhat worse. We have been using 12/17; he apparently just got the Levaquin yesterday this is 2 weeks after the fact. He has completed the Flagyl. The area over the left fifth metatarsal base still has protruding granulation tissue although it does not look quite as bad as it did some weeks ago. He has severe bilateral lymphedema although we have not been treating him for wounds on his legs this is definitely going to require compression. There was so much edema in the left I did not wish to put him in a total contact cast today. I am going to increase his compression from 3-4 layer. The area on the right lateral fifth met head actually look quite good and superficial. 12/23; patient arrived with callus on the right fifth met head and the substantial hyper granulated callused wound on the base of his fifth metatarsal. He says he is completing his Levaquin in 2 days but I do not think that adds up with what I gave him but I will have to double check this. We are using Hydrofera Blue on both areas. My plan is to put the left leg in a cast the week after New Year's 04/06/2020; patient's wounds about the same. Right lateral fifth metatarsal head and left lateral foot over the base of the fifth metatarsal. There is undermining on the left lateral foot which I removed before application of total contact cast continuing with Hydrofera Blue new. Patient tells me he was seen by endocrinology today lab work was done [Dr. Kerr]. Also wondering whether he was referred to  cardiology. I went over some lab work from previously does not have chronic renal failure certainly not nephrotic range proteinuria he does have very poorly controlled diabetes but this is not his most updated lab work. Hemoglobin A1c has been over 11 1/10; the patient had a considerable amount of leakage towards mid part of his left foot with macerated skin however the wound surface looks better the area on the right lateral fifth met head is better as well. I am going to change the dressing on the left foot under the total contact cast to silver alginate, continue with Hydrofera Blue on the right. 1/20; patient was in the total contact cast for 10 days. Considerable amount of drainage although the skin around the wound does not look too bad on the left  foot. The area on the right fifth metatarsal head is closed. Our nursing staff reports large amount of drainage out of the left lateral foot wound 1/25; continues with copious amounts of drainage described by our intake staff. PCR culture I did last week showed E. coli and Enterococcus faecalis and low quantities. Multiple resistance genes documented including extended spectrum beta lactamase, MRSA, MRSE, quinolone, tetracycline. The wound is not quite as good this week as it was 5 days ago but about the same size 2/3; continues with copious amounts of malodorous drainage per our intake nurse. The PCR culture I did 2 weeks ago showed E. coli and low quantities of Enterococcus. There were multiple resistance genes detected. I put Neosporin on him last week although this does not seem to have helped. The wound is slightly deeper today. Offloading continues to be an issue here although with the amount of drainage she has a total contact cast is just not going to work 2/10; moderate amount of drainage. Patient reports he cannot get his stocking on over the dressing. I told him we have to do that the nurse gave him suggestions on how to make this work. The  wound is on the bottom and lateral part of his left foot. Is cultured predominantly grew low amounts of Enterococcus, E. coli and anaerobes. There were multiple resistance genes detected including extended spectrum beta lactamase, quinolone, tetracycline. I could not think of an easy oral combination to address this so for now I am going to do topical antibiotics provided by [facility] I think the main agents here are vancomycin and an aminoglycoside. We have to be able to give him access to the wounds to get the topical antibiotic on 2/17; moderate amount of drainage this is unchanged. He has his Keystone topical antibiotic against the deep tissue culture organisms. He has been using this and changing the dressing daily. Silver alginate on the wound surface. 2/24; using Keystone antibiotic with silver alginate on the top. He had too much drainage for a total contact cast at one point although I think that is improving and I think in the next week or 2 it might be possible to replace a total contact cast I did not do this today. In general the wound surface looks healthy however he continues to have thick rims of skin and subcutaneous tissue around the wide area of the circumference which I debrided 06/04/2020 upon evaluation today patient appears to be doing well in regard to his wound. I do feel like he is showing signs of improvement. There is little bit of callus and dead tissue around the edges of the wound as well as what appears to be a little bit of a sinus tract that is off to the side laterally I would perform debridement to clear that away today. 3/17; left lateral foot. The wound looks about the same as I remember. Not much depth surface looks healthy. No evidence of infection 3/25; left lateral foot. Wound surface looks about the same. Separating epithelium from the circumference. There really is no evidence of infection here however not making progress by my view 3/29; left lateral foot.  Surface of the wound again looks reasonably healthy still thick skin and subcutaneous tissue around the wound margins. There is no evidence of infection. One of the concerns being brought up by the nurses has again the amount of drainage vis--vis continued use of a total contact cast 4/5; left lateral foot at roughly the base of the fifth  metatarsal. Nice healthy looking granulated tissue with rims of epithelialization. The overall wound measurements are not any better but the tissue looks healthy. The only concern is the amount of drainage although he has no surrounding maceration with what we have been doing recently to absorb fluid and protect his skin. He also has lymphedema. 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 He states that the kerlix/coban under the TCC rolled down and could not get it back up. He has been using an offloading scooter and has somehow hurt his right foot using this device. This happened last week. He states that the side of his right foot developed a blister and opened. The top of his foot also has a few small open wounds he thinks is due to his socks rubbing in his shoes. He has not been using any dressings to the wound. He denies purulent drainage, fever/chills or erythema to the wounds. 4/22; patient presents for 1 week follow-up. He developed new wounds to the right foot that were evaluated at last clinic visit. He continues to have a total contact cast to the left leg and he reports no issues. He has been using silver collagen to the right foot wounds with no issues. He denies purulent drainage, fever/chills or erythema to the right foot wounds. He has no complaints today 4/25; patient presents for 1 week follow-up. He has  a total contact cast of the left leg and reports no issues. He has been using silver alginate to the right foot wound. He denies purulent drainage, fever/chills or erythema to the right foot wounds. Electronic Signature(s) Signed: 07/27/2020 12:43:16 PM By: [name] DO Entered By: [name] on 07/27/2020 12:36:34 -------------------------------------------------------------------------------- Physical Exam Details Patient Name: Date of Service: [name], [name] 07/27/2020 9:30 A M Medical Record Number: [id number] Patient Account Number: [id number] Date of Birth/Sex: Treating RN: 02/10/1986 (35 y.o. [name] Primary Care Provider: [name] Other Clinician: Referring Provider: Treating Provider/Extender: [name] in Treatment: 28 Constitutional respirations regular, non-labored and within target range for patient.. Cardiovascular 2+ dorsalis pedis/posterior tibialis pulses. Psychiatric pleasant and cooperative. Notes Left plantar foot wound: There is circumferential callus. There is healthy granulation tissue present. No signs of infection Right lateral foot: Subcutaneous tissue exposed but appears healthy. No signs of infection. Right dorsum of foot: Epithelialized previous wound site Electronic Signature(s) Signed: 07/27/2020 12:43:16 PM By: [name] DO Entered By: [name] on 07/27/2020 12:37:39 -------------------------------------------------------------------------------- Physician Orders Details Patient Name: Date of Service: [name], [name] 07/27/2020 9:30 A M Medical Record Number: [id number] Patient Account Number: [id number] Date of Birth/Sex: Treating RN: 1985-12-29 (35 y.o. [name] Primary Care Provider: [name] Other Clinician: Referring Provider: Treating Provider/Extender: [name] in Treatment: 55 Verbal / Phone Orders: No Diagnosis  Coding ICD-10 Coding Code Description E11.621 Type 2 diabetes mellitus with foot ulcer L97.528 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ity L97.518 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ity Follow-up Appointments ppointment in 1 week. - on Monday Return A Bathing/ Shower/ Hygiene May shower with protection but do not get wound dressing(s) wet. - use a  cast protector to left leg. May shower and wash wound with soap and water. - with dressing changes only to right foot. Edema Control - Lymphedema / SCD / Other Bilateral Lower Extremities Elevate legs to the level of the heart or above for 30 minutes daily and/or when sitting, a frequency of: - throughout the day Avoid standing for long periods of time. Exercise regularly Moisturize legs daily. - right leg every night before bed. Compression stocking or Garment 20-30 mm/Hg pressure to: - Apply to right leg in the morning and remove at night. Off-Loading Total Contact Cast to Left Lower Extremity Open toe surgical shoe to: - right foot, felt callous pad to medial part of shoe to help offload Wound Treatment Wound #3 - Foot Wound Laterality: Left, Lateral Cleanser: Soap and Water 1 x Per Week/30 Days Discharge Instructions: May shower and wash wound with dial antibacterial soap and water prior to dressing change. Peri-Wound Care: Zinc Oxide Ointment 30g tube 1 x Per Week/30 Days Discharge Instructions: Apply Zinc Oxide to periwound with each dressing change as needed. Prim Dressing: Hydrofera Blue Classic Foam, 2x2 in 1 x Per Week/30 Days ary Discharge Instructions: Moisten with saline prior to applying to wound bed Secondary Dressing: Woven Gauze Sponge, Non-Sterile 4x4 in 1 x Per Week/30 Days Discharge Instructions: Apply over primary dressing as directed. Secondary Dressing: ABD Pad, 8x10 1 x Per Week/30 Days Discharge Instructions: T down before  applying kerlix. Apply over primary dressing as directed. ape Secondary Dressing: Zetuvit Plus 4x8 in 1 x Per Week/30 Days Discharge Instructions: Apply over primary dressing as directed. Secured With: [employer], 4.5x3.1 (in/yd) 1 x Per Week/30 Days Discharge Instructions: Secure with Kerlix as directed. Add-Ons: 1 x Per Week/30 Days Wound #5 - Metatarsal head fifth Wound Laterality: Right, Lateral Cleanser: Soap and Water Every Other Day/30 Days Discharge Instructions: May shower and wash wound with dial antibacterial soap and water prior to dressing change. Prim Dressing: KerraCel Ag Gelling Fiber Dressing, 4x5 in (silver alginate) Every Other Day/30 Days ary Discharge Instructions: Apply silver alginate to wound bed, tuck into hole in middle of wound bed Secondary Dressing: ABD Pad, 8x10 Every Other Day/30 Days Discharge Instructions: Apply over primary dressing as directed. Secondary Dressing: Optifoam Non-Adhesive Dressing, 4x4 in Every Other Day/30 Days Discharge Instructions: Apply over primary dressing, cut to make foam donut to offload Secured With: [employer], 4.5x3.1 (in/yd) Every Other Day/30 Days Discharge Instructions: Secure with Kerlix as directed. Secured With: 63M Medipore H Soft Cloth Surgical T 4 x 2 (in/yd) Every Other Day/30 Days ape Discharge Instructions: Secure dressing with tape as directed. Electronic Signature(s) Signed: 07/27/2020 12:43:16 PM By: [name] DO Entered By: [name] on 07/27/2020 12:37:55 -------------------------------------------------------------------------------- Problem List Details Patient Name: Date of Service: [name], [name] 07/27/2020 9:30 A M Medical Record Number: [id number] Patient Account Number: [id number] Date of Birth/Sex: Treating RN: [birth year] (35 y.o. [name] Primary Care Provider: [name] Other Clinician: Referring Provider: Treating Provider/Extender: [name] in Treatment: 39 Active Problems ICD-10 Encounter Code Description Active Date MDM Diagnosis E11.621 Type 2 diabetes mellitus with foot ulcer 10/24/2019 No Yes L97.528 Non-pressure chronic ulcer of other part of left foot with other specified 10/24/2019 No Yes severity L97.518 Non-pressure chronic ulcer of other part of right foot with other specified 07/14/2020 No Yes severity Inactive Problems ICD-10 Code Description Active Date Inactive Date L97.518 Non-pressure chronic ulcer of other part of right foot with  other specified severity 10/24/2019 10/24/2019 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, right ankle and foot 10/24/2019 10/24/2019 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o. 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 He comes in for review of a wound over the plantar fifth metatarsal head extending into the lateral part of the foot. He was followed for this previously by his podiatrist Dr. Cornelius Moras. As the patient tells his story he went to see podiatry first for a swelling he developed on the lateral part of his fifth metatarsal head in May. He states this was "open" by podiatry and the area closed. He was followed up in June and it was again opened callus removed and it closed promptly. There were plans being made for surgery on the fifth metatarsal head in June however his blood sugar was apparently too high for anesthesia. Apparently the area was debrided and opened again in June and it is never closed since. Looking over the records from podiatry I am really not able to follow this. It was clear when he was first seen it was before 5/14 at that point he already had a wound. By 5/17 the ulcer was resolved. I do not see anything about a procedure. On 5/28 noted to have pre-ulcerative moderate keratosis. X-ray noted 1/5 contracted toe and tailor's bunion and metatarsal deformity. On a visit date on 09/28/2018 the dorsal part of the left foot it healed and resolved. There was concern about swelling in his lower extremity he was sent to the ER.. As far as I can tell he was seen in the ER on 7/12 with an ulcer on his left foot. A DVT rule out of the left leg was negative. I do not think I have complete records from podiatry but I am not able to verify the procedures this patient states he had. He states after the last procedure the wound has never closed although I am not able to follow this in the records I have from podiatry. He has not had a recent x-ray The patient has been using Neosporin on the wound. He is wearing a Darco shoe. He is still very active up on his foot working and exercising. Past medical history; type 2 diabetes  ketosis-prone, leg swelling with a negative DVT study in July. Non-smoker ABI in our clinic was 0.85 on the left 10/16; substantial wound on the plantar left fifth met head extending laterally almost to the dorsal fifth MTP. We have been using silver alginate we gave him a Darco forefoot off loader. An x-ray did not show evidence of osteomyelitis did note soft  tissue emphysema which I think was due to gas tracking through an open wound. There is no doubt in my mind he requires an MRI 10/23; MRI not booked until 3 November at the earliest this is largely due to his glucose sensor in the right arm. We have been using silver alginate. There has been an improvement 10/29; I am still not exactly sure when his MRI is booked for. He says it is the third but it is the 10th in epic. This definitely needs to be done. He is running a low-grade fever today but no other symptoms. No real improvement in the 1 02/26/2019 patient presents today for a follow-up visit here in our clinic he is last been seen in the clinic on October 29. Subsequently we were working on getting MRI to evaluate and see what exactly was going on and where we would need to go from the standpoint of whether or not he had osteomyelitis and again what treatments were going be required. Subsequently the patient ended up being admitted to the hospital on 02/07/2019 and was discharged on 02/14/2019. This is a somewhat interesting admission with a discharge diagnosis of pneumonia due to COVID-19 although he was positive for COVID-19 when tested at the urgent care but negative x2 when he was actually in the hospital. With that being said he did have acute respiratory failure with hypoxia and it was noted he also have a left foot ulceration with osteomyelitis. With that being said he did require oxygen for his pneumonia and I level 4 L. He was placed on antivirals and steroids for the COVID-19. He was also transferred to the [place] at one  point. Nonetheless he did subsequently discharged home and since being home has done much better in that regard. The CT angiogram did not show any pulmonary embolism. With regard to the osteomyelitis the patient was placed on vancomycin and Zosyn while in the hospital but has been changed to Augmentin at discharge. It was also recommended that he follow- up with wound care and podiatry. Podiatry however wanted him to see [name] according to the patient prior to them doing anything further. His hemoglobin A1c was 9.9 as noted in the hospital. Have an MRI of the left foot performed while in the hospital on 02/04/2019. 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 There is an overlying plantar open wound noted an abscess tracking back along the lateral aspect of the fifth metatarsal shaft. There is otherwise diffuse cellulitis and mild fasciitis without findings of polymyositis. The patient did have recently pneumonia secondary to COVID-19 I looked in the chart through epic and it does appear that the patient may need to have an additional x-ray just to ensure everything is cleared and that he has no airspace disease prior to putting him into the [name]. 03/05/2019; patient was readmitted to the clinic last week. He was hospitalized twice for a viral upper respiratory tract infection from 11/1 through 11/4 and then 11/5 through 11/12 ultimately this turned out to be Covid pneumonitis. Although he was discharged on oxygen he is not using it. He says he feels fine. He has no exercise limitation no cough no sputum. His O2 sat in our clinic today was 100% on room air. 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 He received  Vanco and Zosyn in the hospital and then was discharged on 2 weeks of Augmentin. I do  not see any relevant cultures. He was supposed to follow-up with infectious disease but I do not see that he has an appointment. 12/8; patient saw Dr. Novella Olive of infectious disease last week. He felt that he had had adequate antibiotic therapy. He did not go to follow-up with Dr. Amalia Hailey of podiatry and I have again talked to him about the pros and cons of this. He does not want to consider a ray amputation of this time. He is aware of the risks of recurrence, migration etc. He started HBO today and tolerated this well. He can complete the Augmentin that I gave him last week. I have looked over the lab work that Dr. Chana Bode ordered his C-reactive protein was 3.3 and his sedimentation rate was 17. The C-reactive protein is never really been measurably that high in this patient 12/15; not much change in the wound today however he has undermining along the lateral part of the foot again more extensively than last week. He has some rims of epithelialization. We have been using silver alginate. He is undergoing hyperbarics but did not dive today 12/18; in for his obligatory first total contact cast change. Unfortunately there was pus coming from the undermining area around his fifth metatarsal head. This was cultured but will preclude reapplication of a cast. He is seen in conjunction with HBO 12/24; patient had staph lugdunensis in the wound in the undermining area laterally last time. We put him on doxycycline which should have covered this. The wound looks better today. I am going to give him another week of doxycycline before reattempting the total contact cast 12/31; the patient is completing antibiotics. Hemorrhagic debris in the distal part of the wound with some undermining distally. He also had hyper granulation. Extensive debridement with a #5 curette. The infected area that was on the lateral part of the fifth met head is closed over. I do not think he needs any more antibiotics. Patient was seen  prior to HBO. Preparations for a total contact cast were made in the cast will be placed post hyperbarics 04/11/19; once again the patient arrives today without complaint. He had been in a cast all week noted that he had heavy drainage this week. This resulted in large raised areas of macerated tissue around the wound 1/14; wound bed looks better slightly smaller. Hydrofera Blue has been changing himself. He had a heavy drainage last week which caused a lot of maceration around the wound so I took him out of a total contact cast he says the drainage is actually better this week He is seen today in conjunction with HBO 1/21; returns to clinic. He was up in [place] for a day or 2 attending a funeral. He comes back in with the wound larger and with a large area of exposed bone. He had osteomyelitis and septic arthritis of the fifth left metatarsal head while he was in hospital. He received IV antibiotics in the hospital for a prolonged period of time then 3 weeks of Augmentin. Subsequently I gave him 2 weeks of doxycycline for more superficial wound infection. When I saw this last week the wound was smaller the surface of the wound looks satisfactory. 1/28; patient missed hyperbarics today. Bone biopsy I did last time showed Enterococcus faecalis and Staphylococcus lugdunensis . He has a wide area of exposed bone. We are going to use silver alginate as of today.  I had another ethical discussion with the patient. 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 In this situation I think the likelihood of healing this is low. Therefore I have recommended a ray amputation and with the patient's agreement I have referred him to Dr. Doran Durand. The other issue is that his compliance with hyperbarics has been minimal because of his work schedule and given his underlying decision I am going to stop this today READMISSION 10/24/2019 MRI 09/29/2019 left foot IMPRESSION: 1. Apparent skin  ulceration inferior and lateral to the 5th metatarsal base with underlying heterogeneous T2 signal and enhancement in the subcutaneous fat. Small peripherally enhancing fluid collections along the plantar and lateral aspects of the 5th metatarsal base suspicious for abscesses. 2. Interval amputation through the mid 5th metatarsal with nonspecific low-level marrow edema and enhancement. Given the proximity to the adjacent soft tissue inflammatory changes, osteomyelitis cannot be excluded. 3. The additional bones appear unremarkable. MRI 09/29/2019 right foot IMPRESSION: 1. Soft tissue ulceration lateral to the 5th MTP joint. There is low-level T2 hyperintensity within the 4th and 5th metatarsal heads and adjacent proximal phalanges without abnormal T1 signal or cortical destruction. These findings are nonspecific and could be seen with early marrow edema, hyperemia or early osteomyelitis. No evidence of septic joint. 2. Mild tenosynovitis and synovial enhancement associated with the extensor digitorum tendons at the level of the midfoot. 3. Diffuse low-level muscular T2 hyperintensity and enhancement, most consistent with diabetic myopathy. LEFT FOOT BONE Methicillin resistant staphylococcus aureus Staphylococcus lugdunensis MIC MIC CIPROFLOXACIN >=8 RESISTANT Resistant <=0.5 SENSI... Sensitive CLINDAMYCIN <=0.25 SENS... Sensitive >=8 RESISTANT Resistant ERYTHROMYCIN >=8 RESISTANT Resistant >=8 RESISTANT Resistant GENTAMICIN <=0.5 SENSI... Sensitive <=0.5 SENSI... Sensitive Inducible Clindamycin NEGATIVE Sensitive NEGATIVE Sensitive OXACILLIN >=4 RESISTANT Resistant 2 SENSITIVE Sensitive RIFAMPIN <=0.5 SENSI... Sensitive <=0.5 SENSI... Sensitive TETRACYCLINE <=1 SENSITIVE Sensitive <=1 SENSITIVE Sensitive TRIMETH/SULFA <=10 SENSIT Sensitive <=10 SENSIT Sensitive ... Marland Kitchen.. VANCOMYCIN 1 SENSITIVE Sensitive <=0.5 SENSI... Sensitive Right foot bone . Component 3 wk ago Specimen  Description BONE Special Requests RIGHT 4 METATARSAL SAMPLE B Gram Stain NO WBC SEEN NO ORGANISMS SEEN Culture RARE METHICILLIN RESISTANT STAPHYLOCOCCUS AUREUS NO ANAEROBES ISOLATED Performed at [facility], [place] [address] [place], [place] [id number] Report Status 10/08/2019 FINAL Organism ID, Bacteria METHICILLIN RESISTANT STAPHYLOCOCCUS AUREUS Resulting Agency [facility] Susceptibility Methicillin resistant staphylococcus aureus MIC CIPROFLOXACIN >=8 RESISTANT Resistant CLINDAMYCIN <=0.25 SENS... Sensitive ERYTHROMYCIN >=8 RESISTANT Resistant GENTAMICIN <=0.5 SENSI... Sensitive Inducible Clindamycin NEGATIVE Sensitive OXACILLIN >=4 RESISTANT Resistant RIFAMPIN <=0.5 SENSI... Sensitive TETRACYCLINE <=1 SENSITIVE Sensitive TRIMETH/SULFA <=10 SENSIT Sensitive ... VANCOMYCIN 1 SENSITIVE Sensitive This is a patient we had in clinic earlier this year with a wound over his left fifth metatarsal head. 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 Doran Durand on 05/16/2019. He seems to have developed problems with wounds on his bilateral feet in June 2021 although he may have had problems earlier than this. He was in an urgent care with a right foot ulcer on 09/26/2019 and given a course of doxycycline. This was apparently after having trouble getting into see orthopedics. He was seen by podiatry on 09/28/2019 noted to have bilateral lower extremity ulcers including the left lateral fifth metatarsal base and the right subfifth  met head. It was noted that had purulent drainage at that time. He required hospitalization from 6/20 through 7/2. This was because of worsening right foot wounds. He underwent  bilateral operative incision and drainage and bone biopsies bilaterally. Culture results are listed above. He has been referred back to clinic by Dr. Jacqualyn Posey of podiatry. He is also followed by Dr. Megan Salon who saw him yesterday. He was discharged from hospital on Zyvox Flagyl and Levaquin and yesterday changed to doxycycline Flagyl and Levaquin. His inflammatory markers on 6/26 showed a sedimentation rate of 129 and a C-reactive protein of 5. This is improved to 14 and 1.3 respectively. This would indicate improvement. ABIs in our clinic today were 1.23 on the right and 1.20 on the left 11/01/2019 on evaluation today patient appears to be doing fairly well in regard to the wounds on his feet at this point. Fortunately there is no signs of active infection at this time. No fevers, chills, nausea, vomiting, or diarrhea. He currently is seeing infectious disease and still under their care at this point. Subsequently he also has both wounds which she has not been using collagen on as he did not receive that in his packaging he did not call us and let us know that. Apparently that just was missed on the order. Nonetheless we will get that straightened out today. 8/9-Patient returns for bilateral foot wounds, using Prisma with hydrogel moistened dressings, and the wounds appear stable. Patient using surgical shoes, avoiding much pressure or weightbearing as much as possible 8/16; patient has bilateral foot wounds. 1 on the right lateral foot proximally the other is on the left mid lateral foot. Both required debridement of callus and thick skin around the wounds. We have been using silver collagen 8/27; patient has bilateral lateral foot wounds. The area on the left substantially surrounded by callus and dry skin. This was removed from the wound edge. The underlying wound is small. The area on the right measured somewhat smaller today. We've been using silver collagen the patient was on antibiotics for  underlying osteomyelitis in the left foot. Unfortunately I did not update his antibiotics during today's visit. 9/10 I reviewed Dr. Hale Bogus last notes he felt he had completed antibiotics his inflammatory markers were reasonably well controlled. He has a small wound on the lateral left foot and a tiny area on the right which is just above closed. He is using Hydrofera Blue with border foam he has bilateral surgical shoes 9/24; 2 week f/u. doing well. right foot is closed. left foot still undermined. 10/14; right foot remains closed at the fifth met head. The area over the base of the left fifth metatarsal has a small open area but considerable undermining towards the plantar foot. Thick callus skin around this suggests an adequate pressure relief. We have talked about this. He says he is going to go back into his cam boot. I suggested a total contact cast he did not seem enamored with this suggestion 10/26; left foot base of the fifth metatarsal. Same condition as last time. He has skin over the area with an open wound however the skin is not adherent. He went to see Dr. Earleen Newport who did an x-ray and culture of his foot I have not reviewed the x-ray but the patient was not told anything. He is on doxycycline 11/11; since the patient was last here he was in the emergency room on 10/30 he was concerned about swelling in the left foot. They did not do  any cultures or x-rays. They changed his antibiotics to cephalexin. Previous culture showed group B strep. The cephalexin is appropriate as doxycycline has less than predictable coverage. Arrives in clinic today with swelling over this area under the wound. He also has a new wound on the right fifth metatarsal head 11/18; the patient has a difficult wound on the lateral aspect of the left fifth metatarsal head. The wound was almost ballotable last week I opened it slightly expecting to see purulence however there was just bleeding. I cultured this this was  negative. X-ray unchanged. We are trying to get an MRI but I am not sure were going to be able to get this through his insurance. He also has an area on the right lateral fifth metatarsal head this looks healthier 12/3; the patient finally got our MRI. Surprisingly this did not show osteomyelitis. I did show the soft tissue ulceration at the lateral plantar aspect of the fifth metatarsal base with a tiny residual 6 mm abscess overlying the superficial fascia I have tried to culture this area I have not been able to get this to grow anything. Nevertheless the protruding tissue looks aggravated. I suspect we should try to treat the underlying "abscess with broad-spectrum antibiotics. I am going to start him on Levaquin and Flagyl. He has much less edema in his legs and I am going to continue to wrap his legs and see him weekly 12/10. I started Levaquin and Flagyl on him last week. He just picked up the Flagyl apparently there was some delay. The worry is the wound on the left fifth metatarsal base which is substantial and worsening. His foot looks like he inverts at the ankle making this a weightbearing surface. Certainly no improvement in fact I think the measurements of this are somewhat worse. We have been using 12/17; he apparently just got the Levaquin yesterday this is 2 weeks after the fact. He has completed the Flagyl. The area over the left fifth metatarsal base still has protruding granulation tissue although it does not look quite as bad as it did some weeks ago. He has severe bilateral lymphedema although we have not been treating him for wounds on his legs this is definitely going to require compression. There was so much edema in the left I did not wish to put him in a total contact cast today. I am going to increase his compression from 3-4 layer. The area on the right lateral fifth met head actually look quite good and superficial. 12/23; patient arrived with callus on the right fifth  met head and the substantial hyper granulated callused wound on the base of his fifth metatarsal. He says he is completing his Levaquin in 2 days but I do not think that adds up with what I gave him but I will have to double check this. We are using Hydrofera Blue on both areas. My plan is to put the left leg in a cast the week after New Year's 04/06/2020; patient's wounds about the same. Right lateral fifth metatarsal head and left lateral foot over the base of the fifth metatarsal. There is undermining on the left lateral foot which I removed before application of total contact cast continuing with Hydrofera Blue new. Patient tells me he was seen by endocrinology today lab work was done [Dr. Kerr]. Also wondering whether he was referred to cardiology. I went over some lab work from previously does not have chronic renal failure certainly not nephrotic range proteinuria he does  have very poorly controlled diabetes but this is not his most updated lab work. Hemoglobin A1c has been over 11 1/10; the patient had a considerable amount of leakage towards mid part of his left foot with macerated skin however the wound surface looks better the area on the right lateral fifth met head is better as well. I am going to change the dressing on the left foot under the total contact cast to silver alginate, continue with Hydrofera Blue on the right. 1/20; patient was in the total contact cast for 10 days. Considerable amount of drainage although the skin around the wound does not look too bad on the left foot. The area on the right fifth metatarsal head is closed. Our nursing staff reports large amount of drainage out of the left lateral foot wound 1/25; continues with copious amounts of drainage described by our intake staff. PCR culture I did last week showed E. coli and Enterococcus faecalis and low quantities. Multiple resistance genes documented including extended spectrum beta lactamase, MRSA, MRSE, quinolone,  tetracycline. The wound is not quite as good this week as it was 5 days ago but about the same size 2/3; continues with copious amounts of malodorous drainage per our intake nurse. The PCR culture I did 2 weeks ago showed E. coli and low quantities of Enterococcus. There were multiple resistance genes detected. I put Neosporin on him last week although this does not seem to have helped. The wound is slightly deeper today. Offloading continues to be an issue here although with the amount of drainage she has a total contact cast is just not going to work 2/10; moderate amount of drainage. Patient reports he cannot get his stocking on over the dressing. I told him we have to do that the nurse gave him suggestions on how to make this work. The wound is on the bottom and lateral part of his left foot. Is cultured predominantly grew low amounts of Enterococcus, E. coli and anaerobes. There were multiple resistance genes detected including extended spectrum beta lactamase, quinolone, tetracycline. I could not think of an easy oral combination to address this so for now I am going to do topical antibiotics provided by [facility] I think the main agents here are vancomycin and an aminoglycoside. We have to be able to give him access to the wounds to get the topical antibiotic on 2/17; moderate amount of drainage this is unchanged. He has his Keystone topical antibiotic against the deep tissue culture organisms. He has been using this and changing the dressing daily. Silver alginate on the wound surface. 2/24; using Keystone antibiotic with silver alginate on the top. He had too much drainage for a total contact cast at one point although I think that is improving and I think in the next week or 2 it might be possible to replace a total contact cast I did not do this today. In general the wound surface looks healthy however he continues to have thick rims of skin and subcutaneous tissue around the wide area of the  circumference which I debrided 06/04/2020 upon evaluation today patient appears to be doing well in regard to his wound. I do feel like he is showing signs of improvement. There is little bit of callus and dead tissue around the edges of the wound as well as what appears to be a little bit of a sinus tract that is off to the side laterally I would perform debridement to clear that away today. 3/17; left lateral  foot. The wound looks about the same as I remember. Not much depth surface looks healthy. No evidence of infection 3/25; left lateral foot. Wound surface looks about the same. Separating epithelium from the circumference. There really is no evidence of infection here however not making progress by my view 3/29; left lateral foot. Surface of the wound again looks reasonably healthy still thick skin and subcutaneous tissue around the wound margins. There is no evidence of infection. One of the concerns being brought up by the nurses has again the amount of drainage vis--vis continued use of a total contact cast 4/5; left lateral foot at roughly the base of the fifth metatarsal. Nice healthy looking granulated tissue with rims of epithelialization. The overall wound measurements are not any better but the tissue looks healthy. The only concern is the amount of drainage although he has no surrounding maceration with what we have been doing recently to absorb fluid and protect his skin. He also has lymphedema. 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 He states that the kerlix/coban under the TCC rolled down and could not get it back up. He has been using an offloading scooter and has somehow hurt his right foot using this device. This happened last  week. He states that the side of his right foot developed a blister and opened. The top of his foot also has a few small open wounds he thinks is due to his socks rubbing in his shoes. He has not been using any dressings to the wound. He denies purulent drainage, fever/chills or erythema to the wounds. 4/22; patient presents for 1 week follow-up. He developed new wounds to the right foot that were evaluated at last clinic visit. He continues to have a total contact cast to the left leg and he reports no issues. He has been using silver collagen to the right foot wounds with no issues. He denies purulent drainage, fever/chills or erythema to the right foot wounds. He has no complaints today 4/25; patient presents for 1 week follow-up. He has a total contact cast of the left leg and reports no issues. He has been using silver alginate to the right foot wound. He denies purulent drainage, fever/chills or erythema to the right foot wounds. Patient History Information obtained from Patient. Family History No family history of Cancer, Diabetes, Hereditary Spherocytosis, Hypertension, Kidney Disease, Lung Disease, Seizures, Stroke, Thyroid Problems, Tuberculosis. Social History Never smoker, Marital Status - Single, Alcohol Use - Rarely, Drug Use - No History, Caffeine Use - Never. Medical History Eyes Denies history of Cataracts, Glaucoma, Optic Neuritis Ear/Nose/Mouth/Throat Denies history of Chronic sinus problems/congestion, Middle ear problems Hematologic/Lymphatic Denies history of Anemia, Hemophilia, Human Immunodeficiency Virus, Lymphedema, Sickle Cell Disease Respiratory Denies history of Aspiration, Asthma, Chronic Obstructive Pulmonary Disease (COPD), Pneumothorax, Sleep Apnea, Tuberculosis Cardiovascular Denies history of Angina, Arrhythmia, Congestive Heart Failure, Coronary Artery Disease, Deep Vein Thrombosis, Hypertension, Hypotension, Myocardial Infarction, Peripheral Arterial  Disease, Peripheral Venous Disease, Phlebitis, Vasculitis Gastrointestinal Denies history of Cirrhosis , Colitis, Crohnoos, Hepatitis A, Hepatitis B, Hepatitis C Endocrine Patient has history of Type II Diabetes Denies history of Type I Diabetes Immunological Denies history of Lupus Erythematosus, Raynaudoos, Scleroderma Integumentary (Skin) Denies history of History of Burn Musculoskeletal Denies  history of Gout, Rheumatoid Arthritis, Osteoarthritis, Osteomyelitis Neurologic Denies history of Dementia, Neuropathy, Quadriplegia, Paraplegia, Seizure Disorder Oncologic Denies history of Received Chemotherapy, Received Radiation Psychiatric Denies history of Anorexia/bulimia, Confinement Anxiety Hospitalization/Surgery History - 11/1-11/06/2018- sepsis foot infection. - 11/4-11/5 02 sats low respiratory distress. Objective Constitutional respirations regular, non-labored and within target range for patient.. Vitals Time Taken: 9:58 AM, Height: 77 in, Weight: 280 lbs, BMI: 33.2, Temperature: 98.4 F, Pulse: 92 bpm, Respiratory Rate: 18 breaths/min, Blood Pressure: 131/84 mmHg, Capillary Blood Glucose: 130 mg/dl. Cardiovascular 2+ dorsalis pedis/posterior tibialis pulses. Psychiatric pleasant and cooperative. General Notes: Left plantar foot wound: There is circumferential callus. There is healthy granulation tissue present. No signs of infection Right lateral foot: Subcutaneous tissue exposed but appears healthy. No signs of infection. Right dorsum of foot: Epithelialized previous wound site Integumentary (Hair, Skin) Wound #3 status is Open. Original cause of wound was Trauma. The date acquired was: 10/02/2019. The wound has been in treatment 39 weeks. The wound is located on the Left,Lateral Foot. The wound measures 2.5cm length x 2.7cm width x 0.1cm depth; 5.301cm^2 area and 0.53cm^3 volume. There is Fat Layer (Subcutaneous Tissue) exposed. There is no tunneling or undermining noted.  There is a medium amount of serosanguineous drainage noted. The wound margin is flat and intact. There is large (67-100%) red granulation within the wound bed. There is no necrotic tissue within the wound bed. Wound #5 status is Open. Original cause of wound was Gradually Appeared. The date acquired was: 07/07/2020. The wound has been in treatment 1 weeks. The wound is located on the Right,Lateral Metatarsal head fifth. The wound measures 3cm length x 3.5cm width x 0.5cm depth; 8.247cm^2 area and 4.123cm^3 volume. There is Fat Layer (Subcutaneous Tissue) exposed. There is no tunneling or undermining noted. There is a medium amount of serosanguineous drainage noted. The wound margin is well defined and not attached to the wound base. There is large (67-100%) red, pink granulation within the wound bed. There is a small (1-33%) amount of necrotic tissue within the wound bed including Adherent Slough. Wound #6 status is Healed - Epithelialized. Original cause of wound was Gradually Appeared. The date acquired was: 06/02/2020. The wound has been in treatment 1 weeks. The wound is located on the Right,Anterior Ankle. The wound measures 0cm length x 0cm width x 0cm depth; 0cm^2 area and 0cm^3 volume. 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 We will continue with total contact cast. The wound on the right side is not showing progression although the tissue appears healthy. I believe this is a pressure point for the patient and I recommended he use a surgical shoe with offloading felt pad. This was provided to him today. If I do not see signs of improvement on the next visit he may benefit from an x-ray. Procedures Wound #3 Pre-procedure diagnosis of Wound #3  is a Diabetic Wound/Ulcer of the Lower Extremity located on the Left,Lateral Foot .Severity of Tissue Pre Debridement is: Fat layer exposed. There was a Excisional Skin/Subcutaneous Tissue Debridement with a total area of 6.75 sq cm performed by [name], DO. With the following instrument(s): Curette to remove Viable and Non-Viable tissue/material. Material removed includes Callus, Subcutaneous Tissue, and Slough after achieving pain control using Other (benzocaine 20% spray). No specimens were  taken. A time out was conducted at 11:05, prior to the start of the procedure. A Minimum amount of bleeding was controlled with Pressure. The procedure was tolerated well with a pain level of 0 throughout and a pain level of 0 following the procedure. Post Debridement Measurements: 2.5cm length x 2.7cm width x 0.1cm depth; 0.53cm^3 volume. Character of Wound/Ulcer Post Debridement is improved. Severity of Tissue Post Debridement is: Fat layer exposed. Post procedure Diagnosis Wound #3: Same as Pre-Procedure Pre-procedure diagnosis of Wound #3 is a Diabetic Wound/Ulcer of the Lower Extremity located on the Left,Lateral Foot . There was a T Programmer, multimedia otal Procedure by [name], DO. Post procedure Diagnosis Wound #3: Same as Pre-Procedure Wound #5 Pre-procedure diagnosis of Wound #5 is a Diabetic Wound/Ulcer of the Lower Extremity located on the Right,Lateral Metatarsal head fifth .Severity of Tissue Pre Debridement is: Fat layer exposed. There was a Excisional Skin/Subcutaneous Tissue Debridement with a total area of 10.5 sq cm performed by [name], DO. With the following instrument(s): Curette to remove Viable and Non-Viable tissue/material. Material removed includes Callus, Subcutaneous Tissue, and Skin: Epidermis after achieving pain control using Other (benzocaine 20% spray). No specimens were taken. A time out was conducted at 11:05, prior to the start of the procedure. A  Minimum amount of bleeding was controlled with Pressure. The procedure was tolerated well with a pain level of 0 throughout and a pain level of 0 following the procedure. Post Debridement Measurements: 3cm length x 3.5cm width x 0.5cm depth; 4.123cm^3 volume. Character of Wound/Ulcer Post Debridement is improved. Severity of Tissue Post Debridement is: Fat layer exposed. Post procedure Diagnosis Wound #5: Same as Pre-Procedure Plan Follow-up Appointments: Return Appointment in 1 week. - on Monday Bathing/ Shower/ Hygiene: May shower with protection but do not get wound dressing(s) wet. - use a cast protector to left leg. May shower and wash wound with soap and water. - with dressing changes only to right foot. Edema Control - Lymphedema / SCD / Other: Elevate legs to the level of the heart or above for 30 minutes daily and/or when sitting, a frequency of: - throughout the day Avoid standing for long periods of time. Exercise regularly Moisturize legs daily. - right leg every night before bed. Compression stocking or Garment 20-30 mm/Hg pressure to: - Apply to right leg in the morning and remove at night. Off-Loading: T Contact Cast to Left Lower Extremity otal Open toe surgical shoe to: - right foot, felt callous pad to medial part of shoe to help offload WOUND #3: - Foot Wound Laterality: Left, Lateral Cleanser: Soap and Water 1 x Per Week/30 Days Discharge Instructions: May shower and wash wound with dial antibacterial soap and water prior to dressing change. Peri-Wound Care: Zinc Oxide Ointment 30g tube 1 x Per Week/30 Days Discharge Instructions: Apply Zinc Oxide to periwound with each dressing change as needed. Prim Dressing: Hydrofera Blue Classic Foam, 2x2 in 1 x Per Week/30 Days ary Discharge Instructions: Moisten with saline prior to applying to wound bed Secondary Dressing: Woven Gauze Sponge, Non-Sterile 4x4 in 1 x Per Week/30 Days Discharge Instructions: Apply over primary  dressing as directed. Secondary Dressing: ABD Pad, 8x10 1 x Per Week/30 Days Discharge Instructions: T down before applying kerlix. Apply over primary dressing as directed. ape Secondary Dressing: Zetuvit Plus 4x8 in 1 x Per Week/30 Days Discharge Instructions: Apply over primary dressing as directed. Secured With: [employer], 4.5x3.1 (in/yd) 1 x Per Week/30 Days Discharge Instructions: Secure  with Kerlix as directed. Add-Ons: 1 x Per Week/30 Days WOUND #5: - Metatarsal head fifth Wound Laterality: Right, Lateral Cleanser: Soap and Water Every Other Day/30 Days Discharge Instructions: May shower and wash wound with dial antibacterial soap and water prior to dressing change. Prim Dressing: KerraCel Ag Gelling Fiber Dressing, 4x5 in (silver alginate) Every Other Day/30 Days ary Discharge Instructions: Apply silver alginate to wound bed, tuck into hole in middle of wound bed Secondary Dressing: ABD Pad, 8x10 Every Other Day/30 Days Discharge Instructions: Apply over primary dressing as directed. Secondary Dressing: Optifoam Non-Adhesive Dressing, 4x4 in Every Other Day/30 Days Discharge Instructions: Apply over primary dressing, cut to make foam donut to offload Secured With: [employer], 4.5x3.1 (in/yd) Every Other Day/30 Days Discharge Instructions: Secure with Kerlix as directed. Secured With: 9M Medipore H Soft Cloth Surgical T 4 x 2 (in/yd) Every Other Day/30 Days ape Discharge Instructions: Secure dressing with tape as directed. 1. Left lower extremity and total contact cast with Hydrofera Blue 2. Silver alginate to the right lateral foot wound 3. Aggressive offloading Electronic Signature(s) Signed: 07/27/2020 12:43:16 PM By: [name] DO Entered By: [name] on 07/27/2020 12:41:53 -------------------------------------------------------------------------------- HxROS Details Patient Name: Date of Service: [name], [name] 07/27/2020 9:30 A  M Medical Record Number: [id number] Patient Account Number: [id number] Date of Birth/Sex: Treating RN: 06-19-1985 (34 y.o. [name] Primary Care Provider: [name] Other Clinician: Referring Provider: Treating Provider/Extender: [name] in Treatment: 39 Information Obtained From Patient Eyes Medical History: Negative for: Cataracts; Glaucoma; Optic Neuritis Ear/Nose/Mouth/Throat Medical History: Negative for: Chronic sinus problems/congestion; Middle ear problems Hematologic/Lymphatic Medical History: Negative for: Anemia; Hemophilia; Human Immunodeficiency Virus; Lymphedema; Sickle Cell Disease Respiratory Medical History: Negative for: Aspiration; Asthma; Chronic Obstructive Pulmonary Disease (COPD); Pneumothorax; Sleep Apnea; Tuberculosis Cardiovascular Medical History: Negative for: Angina; Arrhythmia; Congestive Heart Failure; Coronary Artery Disease; Deep Vein Thrombosis; Hypertension; Hypotension; Myocardial Infarction; Peripheral Arterial Disease; Peripheral Venous Disease; Phlebitis; Vasculitis Gastrointestinal Medical History: Negative for: Cirrhosis ; Colitis; Crohns; Hepatitis A; Hepatitis B; Hepatitis C Endocrine Medical History: Positive for: Type II Diabetes Negative for: Type I Diabetes Time with diabetes: 8 Treated with: Insulin Blood sugar tested every day: No Immunological Medical History: Negative for: Lupus Erythematosus; Raynauds; Scleroderma Integumentary (Skin) Medical History: Negative for: History of Burn Musculoskeletal Medical History: Negative for: Gout; Rheumatoid Arthritis; Osteoarthritis; Osteomyelitis Neurologic Medical History: Negative for: Dementia; Neuropathy; Quadriplegia; Paraplegia; Seizure Disorder Oncologic Medical History: Negative for: Received Chemotherapy; Received Radiation Psychiatric Medical History: Negative for: Anorexia/bulimia; Confinement  Anxiety Immunizations Pneumococcal Vaccine: Received Pneumococcal Vaccination: No Implantable Devices None Hospitalization / Surgery History Type of Hospitalization/Surgery 11/1-11/06/2018- sepsis foot infection 11/4-11/5 02 sats low respiratory distress Family and Social History Cancer: No; Diabetes: No; Hereditary Spherocytosis: No; Hypertension: No; Kidney Disease: No; Lung Disease: No; Seizures: No; Stroke: No; Thyroid Problems: No; Tuberculosis: No; Never smoker; Marital Status - Single; Alcohol Use: Rarely; Drug Use: No History; Caffeine Use: Never; Financial Concerns: No; Food, Clothing or Shelter Needs: No; Support System Lacking: No; Transportation Concerns: No Electronic Signature(s) Signed: 07/27/2020 12:43:16 PM By: [name] DO Signed: 07/27/2020 4:57:45 PM By: Baruch Gouty RN, BSN Entered By: [name] on 07/27/2020 12:36:41 -------------------------------------------------------------------------------- Total Contact Cast Details Patient Name: Date of Service: [name], [name] 07/27/2020 9:30 A M Medical Record Number: [id number] Patient Account Number: [id number] Date of Birth/Sex: Treating RN: 03-21-1986 (35 y.o. [name] Primary Care Provider: [name] Other Clinician: Referring Provider: Treating Provider/Extender:  [name] [name] Weeks in Treatment: 39 T Contact Cast Applied for Wound Assessment: otal Wound #3 Left,Lateral Foot Performed By: Physician [name], DO Post Procedure Diagnosis Same as Pre-procedure Electronic Signature(s) Signed: 07/27/2020 12:43:16 PM By: [name] DO Signed: 07/27/2020 4:57:45 PM By: Baruch Gouty RN, BSN Entered By: Baruch Gouty on 07/27/2020 11:21:02 -------------------------------------------------------------------------------- [place] Details Patient Name: Date of Service: [name], [name] 07/27/2020 Medical Record Number: [id number] Patient Account  Number: [id number] Date of Birth/Sex: Treating RN: 11/30/1985 (35 y.o. [name] Primary Care Provider: [name] Other Clinician: Referring Provider: Treating Provider/Extender: [name] in Treatment: 39 Diagnosis Coding ICD-10 Codes Code Description E11.621 Type 2 diabetes mellitus with foot ulcer L97.528 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ity L97.518 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ity Facility Procedures CPT4 Code: 82800349 Description: 17915 - DEB SUBQ TISSUE 20 SQ CM/< ICD-10 Diagnosis Description L97.528 Non-pressure chronic ulcer of other part of left foot with other specified seve L97.518 Non-pressure chronic ulcer of other part of right foot with other specified sev Modifier: rity erity Quantity: 1 Electronic Signature(s) Signed: 07/27/2020 12:43:16 PM By: [name] DO Entered By: [name] on 07/27/2020 12:42:46

## 2020-08-02 ENCOUNTER — Ambulatory Visit (INDEPENDENT_AMBULATORY_CARE_PROVIDER_SITE_OTHER): Payer: BC Managed Care – PPO

## 2020-08-02 ENCOUNTER — Encounter (HOSPITAL_COMMUNITY): Payer: Self-pay

## 2020-08-02 ENCOUNTER — Other Ambulatory Visit: Payer: Self-pay

## 2020-08-02 ENCOUNTER — Ambulatory Visit (HOSPITAL_COMMUNITY)
Admission: EM | Admit: 2020-08-02 | Discharge: 2020-08-02 | Disposition: A | Discharge status: Home / Self Care | Payer: BC Managed Care – PPO | Attending: Medical Oncology | Admitting: Medical Oncology

## 2020-08-02 DIAGNOSIS — R6883 Chills (without fever): Secondary | ICD-10-CM

## 2020-08-02 DIAGNOSIS — Z794 Long term (current) use of insulin: Secondary | ICD-10-CM | POA: Diagnosis not present

## 2020-08-02 DIAGNOSIS — Z789 Other specified health status: Secondary | ICD-10-CM | POA: Diagnosis present

## 2020-08-02 DIAGNOSIS — Z20822 Contact with and (suspected) exposure to covid-19: Secondary | ICD-10-CM | POA: Diagnosis not present

## 2020-08-02 DIAGNOSIS — R059 Cough, unspecified: Secondary | ICD-10-CM

## 2020-08-02 DIAGNOSIS — R058 Other specified cough: Secondary | ICD-10-CM | POA: Diagnosis not present

## 2020-08-02 DIAGNOSIS — E10621 Type 1 diabetes mellitus with foot ulcer: Secondary | ICD-10-CM | POA: Diagnosis not present

## 2020-08-02 MED ORDER — FLUTICASONE PROPIONATE 50 MCG/ACT NA SUSP: 2.0000 | Freq: Every day | NASAL | 0 refills | Status: DC

## 2020-08-02 MED ORDER — BENZONATATE 100 MG PO CAPS: 100.0000 mg | ORAL_CAPSULE | Freq: Three times a day (TID) | ORAL | 0 refills | Status: DC

## 2020-08-02 NOTE — ED Provider Notes (Signed)
[facility]    CSN: [id number] Arrival date & time: 08/02/20  1117      History   Chief Complaint Chief Complaint  Patient presents with  . Cough  . Chills    HPI [name] Lundin is a 35 y.o. male.   HPI   Cough and chills: Patient reports that he has had a dry cough and chills for the past week.  He has noticed that his glucose values at home have increased steadily over the last few days so he is concerned that he is developing an infection.  Normal average glucose values are around 120 but he notes that with eating his sugars rise up to about 320 even despite taking his normal insulin.  He does report a foot wound of the left foot that has been stable and improving.  He has been recently seen by his podiatrist for this foot and has been ensured that the foot is not the cause of his elevated glucose levels and he does not have an infection currently. In terms of his sinus medications he has not tried anything yet for symptoms. No fever, wheezing, chest pain. Scant SOB with coughing. Defers foot examination as it has been professionally wrapped by his specialist.   Past Medical History:  Diagnosis Date  . Diabetes mellitus   . Osteomyelitis [facility])     Patient Active Problem List   Diagnosis Date Noted  . Diabetic foot ulcer associated with type 1 diabetes mellitus (HCC)   . Leg swelling   . Cellulitis of right lower extremity   . Diabetic foot infection (HCC) 09/28/2019  . Normocytic anemia 09/28/2019  . 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 Severe nonproliferative diabetic retinopathy of left eye, with macular edema, associated with type 1 diabetes mellitus (HCC) 07/18/2019  . Diabetic cataract (HCC) 07/18/2019  . Osteomyelitis of ankle or foot, acute, left (HCC) 03/11/2019  . Pneumonia due to severe acute respiratory syndrome coronavirus 2 (SARS-CoV-2) 02/12/2019   . Pneumonia due to COVID-19 virus 02/07/2019  . Acute respiratory failure with hypoxia (HCC) 02/07/2019  . Uncontrolled type 1 diabetes mellitus with hyperglycemia (HCC) 02/07/2019  . COVID-19 virus infection 02/04/2019  . Sepsis (HCC) 02/03/2019  . 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 AKI (acute kidney injury) (HCC) 02/03/2019  . CAP (community acquired pneumonia) 06/28/2017  . 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 Hyperglycemia without ketosis 01/19/2013    Past Surgical History:  Procedure Laterality Date  . 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 NO PAST SURGERIES    . WOUND DEBRIDEMENT Bilateral 10/02/2019   Procedure: DEBRIDEMENT WOUNDS BOTH LOWER EXTREMITIES WITH BONE BIOPSIES;  Surgeon: Vivi Barrack, DPM;  Location: [facility];  Service: Podiatry;  Laterality: Bilateral;       Home Medications    Prior to Admission medications   Medication Sig Start Date End Date Taking? Authorizing Provider  Continuous Blood Gluc Sensor (FREESTYLE LIBRE 14 DAY SENSOR) MISC Inject 1 patch into the skin every 14 (fourteen) days.    [provider]  Continuous Blood Gluc Sensor MISC 1 each by Does not apply route as directed. Use as directed every 14 days. May dispense FreeStyle [employer] or similar. 06/30/17   Margo Aye,  Oliver Pila, DO  doxycycline (VIBRA-TABS) 100 MG tablet Take 1 tablet (100 mg total) by mouth 2 (two) times daily. 01/27/20   Vivi Barrack, DPM  insulin aspart (NOVOLOG) 100 UNIT/ML injection Inject 0-15 Units into the skin 3 (three) times daily with meals. Patient taking differently: Inject 7-12 Units into the skin 3 (three) times daily as needed (before meals, if BGL is 250 or greater).  07/01/17   Lonia Blood, MD  Insulin Disposable Pump (OMNIPOD DASH 5  PACK PODS) MISC Inject into the skin. 01/10/20   [provider]  insulin glargine (LANTUS SOLOSTAR) 100 UNIT/ML Solostar Pen Inject 40 Units into the skin daily before breakfast. 08/20/19   Gwyneth Sprout, MD  SANTYL ointment Apply topically daily. 10/02/19   [provider]  silver sulfADIAZINE (SILVADENE) 1 % cream Apply pea-sized amount to wound daily. 10/10/19   Park Liter, DPM    Family History Family History  Problem Relation Age of Onset  . Hypertension Other   . Diabetes Other   . Healthy Mother   . Colon cancer Neg Hx   . Stomach cancer Neg Hx   . Pancreatic cancer Neg Hx   . Esophageal cancer Neg Hx     Social History Social History   Tobacco Use  . Smoking status: Never Smoker  . Smokeless tobacco: Never Used  Vaping Use  . Vaping Use: Never used  Substance Use Topics  . Alcohol use: No  . Drug use: No     Allergies   Basaglar kwikpen [insulin glargine], Metformin and related, and Trulicity [dulaglutide]   Review of Systems Review of Systems  As stated above in HPI Physical Exam Triage Vital Signs ED Triage Vitals  Enc Vitals Group     BP 08/02/20 1231 124/80     Pulse Rate 08/02/20 1231 91     Resp 08/02/20 1231 18     Temp --      Temp src --      SpO2 08/02/20 1231 99 %     Weight --      Height --      Head Circumference --      Peak Flow --      Pain Score 08/02/20 1230 0     Pain Loc --      Pain Edu? --      Excl. in GC? --    No data found.  Updated Vital Signs BP 124/80 (BP Location: Right Arm)   Pulse 91   Resp 18   SpO2 99%   Physical Exam Vitals and nursing note reviewed.  Constitutional:      General: He is not in acute distress.    Appearance: Normal appearance. He is obese. He is not ill-appearing, toxic-appearing or diaphoretic.  HENT:     Head: Normocephalic and atraumatic.     Right Ear: Tympanic membrane, ear canal and external ear normal.     Left Ear: Tympanic membrane, ear canal and  external ear normal.     Nose: Nose normal.     Mouth/Throat:     Mouth: Mucous membranes are moist.     Pharynx: No oropharyngeal exudate or posterior oropharyngeal erythema.  Eyes:     Extraocular Movements: Extraocular movements intact.     Pupils: Pupils are equal, round, and reactive to light.  Cardiovascular:     Rate and Rhythm: Normal rate and regular rhythm.     Heart sounds: Normal heart sounds.  Pulmonary:  Effort: Pulmonary effort is normal.     Breath sounds: Normal breath sounds.  Musculoskeletal:     Cervical back: Normal range of motion and neck supple.  Lymphadenopathy:     Cervical: No cervical adenopathy.  Neurological:     Mental Status: He is alert and oriented to person, place, and time.      UC Treatments / Results  Labs (all labs ordered are listed, but only abnormal results are displayed) Labs Reviewed  SARS CORONAVIRUS 2 (TAT 6-24 HRS)    EKG   Radiology DG Chest 2 View  Result Date: 08/02/2020 CLINICAL DATA:  Cough and chills. EXAM: CHEST - 2 VIEW COMPARISON:  03/04/2019 and prior studies FINDINGS: The cardiomediastinal silhouette is unremarkable. Mild peribronchial thickening again noted. There is no evidence of focal airspace disease, pulmonary edema, suspicious pulmonary nodule/mass, pleural effusion, or pneumothorax. No acute bony abnormalities are identified. IMPRESSION: No active cardiopulmonary disease. Electronically Signed   By: Harmon Pier M.D.   On: 08/02/2020 13:27    Procedures Procedures (including critical care time)  Medications Ordered in UC Medications - No data to display  Initial Impression / Assessment and Plan / UC Course  I have reviewed the triage vital signs and the nursing notes.  Pertinent labs & imaging results that were available during my care of the patient were reviewed by me and considered in my medical decision making (see chart for details).     New.  COVID-19 testing pending.  Chest x-ray  pending.  UPDATE: His x-ray is negative for abnormality. NO urinary symptoms. AT this time we have elected a watchful waiting approach as he has an appointment with his specialist tomorrow. HE is agreeable with this plan. Red flag signs and symptoms discussed.    Final Clinical Impressions(s) / UC Diagnoses   Final diagnoses:  None   Discharge Instructions   None    ED Prescriptions    None     PDMP not reviewed this encounter.   Rushie Chestnut, [place] 08/02/20 1353

## 2020-08-02 NOTE — ED Triage Notes (Signed)
Pt present night sweets and coughing. Symptom started a week ago. Pt states he has some type of infection because his glucose levels has increased

## 2020-08-03 ENCOUNTER — Encounter (HOSPITAL_BASED_OUTPATIENT_CLINIC_OR_DEPARTMENT_OTHER): Payer: BC Managed Care – PPO | Attending: Internal Medicine | Admitting: Internal Medicine

## 2020-08-03 DIAGNOSIS — Z8616 Personal history of COVID-19: Secondary | ICD-10-CM | POA: Insufficient documentation

## 2020-08-03 DIAGNOSIS — L97518 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ity: Secondary | ICD-10-CM | POA: Insufficient documentation

## 2020-08-03 DIAGNOSIS — E11621 Type 2 diabetes mellitus with foot ulcer: Secondary | ICD-10-CM | POA: Insufficient documentation

## 2020-08-03 DIAGNOSIS — L97528 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ity: Secondary | ICD-10-CM | POA: Diagnosis not present

## 2020-08-03 DIAGNOSIS — M659 Synovitis and tenosynovitis, unspecified: Secondary | ICD-10-CM | POA: Diagnosis not present

## 2020-08-03 DIAGNOSIS — L97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: Secondary | ICD-10-CM | POA: Insufficient documentation

## 2020-08-03 LAB — SARS CORONAVIRUS 2 (TAT 6-24 HRS): SARS Coronavirus 2: NEGATIVE

## 2020-08-03 NOTE — Progress Notes (Signed)
[name], Max ([id number]) Visit Report for 08/03/2020 Arrival Information Details Patient Name: Date of Service: [name] 08/03/2020 12:30 PM Medical Record Number: [id number] Patient Account Number: [id number] Date of Birth/Sex: Treating RN: 05/06/1985 (35 y.o. [name] Primary Care Lavra Imler: [name] Other Clinician: Referring Kamiryn Bezanson: Treating Algie Cales/Extender: [name] in Treatment: 44 Visit Information History Since Last Visit Added or deleted any medications: No Patient Arrived: Ambulatory Any new allergies or adverse reactions: No Arrival Time: 12:40 Had a fall or experienced change in No Accompanied By: self activities of daily living that may affect Transfer Assistance: None risk of falls: Patient Identification Verified: Yes Signs or symptoms of abuse/neglect since last visito No Secondary Verification Process Completed: Yes Hospitalized since last visit: No Patient Requires Transmission-Based Precautions: No Implantable device outside of the clinic excluding No Patient Has Alerts: No cellular tissue based products placed in the center since last visit: Has Dressing in Place as Prescribed: Yes Pain Present Now: No Electronic Signature(s) Signed: 08/03/2020 5:00:30 PM By: Sandre Kitty Entered By: Sandre Kitty on 08/03/2020 12:41:18 -------------------------------------------------------------------------------- Encounter Discharge Information Details Patient Name: Date of Service: [name], [name] 08/03/2020 12:30 PM Medical Record Number: [id number] Patient Account Number: [id number] Date of Birth/Sex: Treating RN: [birth year] (35 y.o. [name] Primary Care Crystalyn Delia: [name] Other Clinician: Referring Kit Mollett: Treating Guiliana Shor/Extender: [name] in Treatment: 35 Encounter Discharge Information Items Discharge Condition: Stable Ambulatory Status:  Ambulatory Discharge Destination: Home Transportation: Private Auto Accompanied By: self Schedule Follow-up Appointment: Yes Clinical Summary of Care: Patient Declined Electronic Signature(s) Signed: 08/03/2020 5:18:58 PM By: Rhae Hammock RN Entered By: Rhae Hammock on 08/03/2020 13:33:03 -------------------------------------------------------------------------------- Lower Extremity Assessment Details Patient Name: Date of Service: [name], [name] 08/03/2020 12:30 PM Medical Record Number: [id number] Patient Account Number: [id number] Date of Birth/Sex: Treating RN: 09/08/1985 (35 y.o. [name] Primary Care Joevon Holliman: [name] Other Clinician: Referring Amando Ishikawa: Treating Marion Seese/Extender: [name] in Treatment: 40 Edema Assessment Assessed: [name]: Yes] [name]: Yes] Edema: [Left: Yes] [Right: Yes] Calf Left: Right: Point of Measurement: 48 cm From Medial Instep 53 cm 47 cm Ankle Left: Right: Point of Measurement: 10 cm From Medial Instep 31.5 cm 30 cm Vascular Assessment Pulses: Dorsalis Pedis Palpable: [Left:Yes] [Right:Yes] Electronic Signature(s) Signed: 08/03/2020 5:21:21 PM By: [name] Entered By: [name] on 08/03/2020 13:08:14 -------------------------------------------------------------------------------- Multi Wound Chart Details Patient Name: Date of Service: [name], [name] 08/03/2020 12:30 PM Medical Record Number: [id number] Patient Account Number: [id number] Date of Birth/Sex: Treating RN: 08-Dec-1985 (35 y.o. [name] Primary Care Katori Wirsing: [name] Other Clinician: Referring Nanami Whitelaw: Treating Daneli Butkiewicz/Extender: [name] in Treatment: 40 Vital Signs Height(in): 77 Capillary Blood Glucose(mg/dl): 180 Weight(lbs): 280 Pulse(bpm): 105 Body Mass Index(BMI): 33 Blood Pressure(mmHg): 146/90 Temperature(F): 97.8 Respiratory Rate(breaths/min):  18 Photos: [3:No Photos Left, Lateral Foot] [5:No Photos Right, Lateral Metatarsal head fifth] [N/A:N/A N/A] Wound Location: [3:Trauma] [5:Gradually Appeared] [N/A:N/A] Wounding Event: [3:Diabetic Wound/Ulcer of the Lower] [5:Diabetic Wound/Ulcer of the Lower] [N/A:N/A] Primary Etiology: [3:Extremity Type II Diabetes] [5:Extremity Type II Diabetes] [N/A:N/A] Comorbid History: [3:10/02/2019] [5:07/07/2020] [N/A:N/A] Date Acquired: [3:40] [5:2] [N/A:N/A] Weeks of Treatment: [3:Open] [5:Open] [N/A:N/A] Wound Status: [3:2.5x2.1x0.5] [5:2.9x3.6x0.5] [N/A:N/A] Measurements L x W x [name] [3:4.123] [5:8.2] [N/A:N/A] A (cm) : rea [3:2.062] [5:4.1] [N/A:N/A] Volume (cm) : [3:-150.00%] [5:6.80%] [N/A:N/A] % Reduction in A rea: [3:-1149.70%] [5:6.80%] [N/A:N/A] % Reduction in Volume: [5:4] Position 1 (o'clock): [5:1.5]  Maximum Distance 1 (cm): [3:No] [5:Yes] [N/A:N/A] Tunneling: [3:Grade 2] [5:Grade 2] [N/A:N/A] Classification: [3:Medium] [5:Medium] [N/A:N/A] Exudate A mount: [3:Serosanguineous] [5:Serosanguineous] [N/A:N/A] Exudate Type: [3:red, brown] [5:red, brown] [N/A:N/A] Exudate Color: [3:Well defined, not attached] [5:Well defined, not attached] [N/A:N/A] Wound Margin: [3:Large (67-100%)] [5:Large (67-100%)] [N/A:N/A] Granulation A mount: [3:Red] [5:Red, Pink] [N/A:N/A] Granulation Quality: [3:None Present (0%)] [5:Small (1-33%)] [N/A:N/A] Necrotic A mount: [3:Fat Layer (Subcutaneous Tissue): Yes Fat Layer (Subcutaneous Tissue): Yes N/A] Exposed Structures: [3:Fascia: No Tendon: No Muscle: No Joint: No Bone: No Small (1-33%)] [5:Fascia: No Tendon: No Muscle: No Joint: No Bone: No Small (1-33%)] [N/A:N/A] Epithelialization: [3:Callused periwound] [5:N/A] [N/A:N/A] Assessment Notes: [3:T Contact Cast otal] [5:Chemical Cauterization] [N/A:N/A] Treatment Notes Electronic Signature(s) Signed: 08/03/2020 5:25:01 PM By: [name] MD Signed: 08/03/2020 5:39:21 PM By: [name] RN,  BSN Entered By: [name] on 08/03/2020 13:23:07 -------------------------------------------------------------------------------- Multi-Disciplinary Care Plan Details Patient Name: Date of Service: [name], [name] 08/03/2020 12:30 PM Medical Record Number: [id number] Patient Account Number: [id number] Date of Birth/Sex: Treating RN: 04/30/1985 (35 y.o. [name] Primary Care Holland Kotter: [name] Other Clinician: Referring Serrina Minogue: Treating Lotta Frankenfield/Extender: [name] in Treatment: 82 Multidisciplinary Care Plan reviewed with physician Active Inactive Nutrition Nursing Diagnoses: Imbalanced nutrition Potential for alteratiion in Nutrition/Potential for imbalanced nutrition Goals: Patient/caregiver agrees to and verbalizes understanding of need to use nutritional supplements and/or vitamins as prescribed Date Initiated: 10/24/2019 Date Inactivated: 04/06/2020 Target Resolution Date: 04/03/2020 Goal Status: Met Patient/caregiver will maintain therapeutic glucose control Date Initiated: 10/24/2019 Target Resolution Date: 08/18/2020 Goal Status: Active Interventions: Assess HgA1c results as ordered upon admission and as needed Assess patient nutrition upon admission and as needed per policy Provide education on elevated blood sugars and impact on wound healing Provide education on nutrition Treatment Activities: Education provided on Nutrition : 07/07/2020 Notes: Wound/Skin Impairment Nursing Diagnoses: Impaired tissue integrity Knowledge deficit related to ulceration/compromised skin integrity Goals: Patient/caregiver will verbalize understanding of skin care regimen Date Initiated: 10/24/2019 Target Resolution Date: 08/24/2020 Goal Status: Active Ulcer/skin breakdown will have a volume reduction of 30% by week 4 Date Initiated: 10/24/2019 Date Inactivated: 01/16/2020 Target Resolution Date: 01/10/2020 Unmet Reason: no change  in Goal Status: Unmet measurements. Interventions: Assess patient/caregiver ability to obtain necessary supplies Assess patient/caregiver ability to perform ulcer/skin care regimen upon admission and as needed Assess ulceration(s) every visit Provide education on ulcer and skin care Notes: Electronic Signature(s) Signed: 08/03/2020 5:39:21 PM By: [name] RN, BSN Entered By: [name] on 08/03/2020 16:54:58 -------------------------------------------------------------------------------- Pain Assessment Details Patient Name: Date of Service: [name], [name] 08/03/2020 12:30 PM Medical Record Number: [id number] Patient Account Number: [id number] Date of Birth/Sex: Treating RN: April 18, 1985 (35 y.o. [name] Primary Care Ekam Besson: [name] Other Clinician: Referring Danis Pembleton: Treating Rhyli Depaula/Extender: [name] in Treatment: 40 Active Problems Location of Pain Severity and Description of Pain Patient Has Paino No Site Locations Pain Management and Medication Current Pain Management: Electronic Signature(s) Signed: 08/03/2020 5:00:30 PM By: Sandre Kitty Signed: 08/03/2020 5:39:21 PM By: [name] RN, BSN Entered By: Sandre Kitty on 08/03/2020 12:41:52 -------------------------------------------------------------------------------- Patient/Caregiver Education Details Patient Name: Date of Service: [name] 5/2/2022andnbsp12:30 PM Medical Record Number: [id number] Patient Account Number: [id number] Date of Birth/Gender: Treating RN: 1985-06-30 (35 y.o. [name] Primary Care Physician: [name] Other Clinician: Referring Physician: Treating Physician/Extender: [name] in Treatment: 51 Education Assessment Education Provided To: Patient Education Topics Provided Wound/Skin Impairment: Methods:  Explain/Verbal Responses: State content correctly Electronic  Signature(s) Signed: 08/03/2020 5:39:21 PM By: [name] RN, BSN Entered By: [name] on 08/03/2020 16:55:09 -------------------------------------------------------------------------------- Wound Assessment Details Patient Name: Date of Service: [name], [name] 08/03/2020 12:30 PM Medical Record Number: [id number] Patient Account Number: [id number] Date of Birth/Sex: Treating RN: 12-Jul-1985 (35 y.o. [name] Primary Care Chudney Scheffler: [name] Other Clinician: Referring Jamontae Thwaites: Treating Makeba Delcastillo/Extender: [name] in Treatment: 40 Wound Status Wound Number: 3 Primary Etiology: Diabetic Wound/Ulcer of the Lower Extremity Wound Location: Left, Lateral Foot Wound Status: Open Wounding Event: Trauma Comorbid History: Type II Diabetes Date Acquired: 10/02/2019 Weeks Of Treatment: 40 Clustered Wound: No Photos Wound Measurements Length: (cm) 2.5 Width: (cm) 2.1 Depth: (cm) 0.5 Area: (cm) 4.123 Volume: (cm) 2.062 % Reduction in Area: -150% % Reduction in Volume: -1149.7% Epithelialization: Small (1-33%) Tunneling: No Undermining: No Wound Description Classification: Grade 2 Wound Margin: Well defined, not attached Exudate Amount: Medium Exudate Type: Serosanguineous Exudate Color: red, brown Foul Odor After Cleansing: No Slough/Fibrino No Wound Bed Granulation Amount: Large (67-100%) Exposed Structure Granulation Quality: Red Fascia Exposed: No Necrotic Amount: None Present (0%) Fat Layer (Subcutaneous Tissue) Exposed: Yes Tendon Exposed: No Muscle Exposed: No Joint Exposed: No Bone Exposed: No Assessment Notes Callused periwound Treatment Notes Wound #3 (Foot) Wound Laterality: Left, Lateral Cleanser Soap and Water Discharge Instruction: May shower and wash wound with dial antibacterial soap and water prior to dressing change. Peri-Wound Care Zinc Oxide Ointment 30g tube Discharge Instruction: Apply Zinc  Oxide to periwound with each dressing change as needed. Topical Primary Dressing Hydrofera Blue Classic Foam, 2x2 in Discharge Instruction: Moisten with saline prior to applying to wound bed Secondary Dressing Woven Gauze Sponge, Non-Sterile 4x4 in Discharge Instruction: Apply over primary dressing as directed. ABD Pad, 8x10 Discharge Instruction: T down before applying kerlix. Apply over primary dressing as directed. ape Zetuvit Plus 4x8 in Discharge Instruction: Apply over primary dressing as directed. Secured With [employer], 4.5x3.1 (in/yd) Discharge Instruction: Secure with Kerlix as directed. Compression Wrap Compression Stockings Add-Ons Electronic Signature(s) Signed: 08/03/2020 5:00:30 PM By: Sandre Kitty Signed: 08/03/2020 5:39:21 PM By: [name] RN, BSN Entered By: Sandre Kitty on 08/03/2020 16:52:27 -------------------------------------------------------------------------------- Wound Assessment Details Patient Name: Date of Service: [name], [name] 08/03/2020 12:30 PM Medical Record Number: [id number] Patient Account Number: [id number] Date of Birth/Sex: Treating RN: [birth year] (35 y.o. [name] Primary Care [name]: [name] Other Clinician: Referring Kaimana Lurz: Treating Layken Beg/Extender: [name] in Treatment: 40 Wound Status Wound Number: 5 Primary Etiology: Diabetic Wound/Ulcer of the Lower Extremity Wound Location: Right, Lateral Metatarsal head fifth Wound Status: Open Wounding Event: Gradually Appeared Comorbid History: Type II Diabetes Date Acquired: 07/07/2020 Weeks Of Treatment: 2 Clustered Wound: No Photos Wound Measurements Length: (cm) 2.9 Width: (cm) 3.6 Depth: (cm) 0.5 Area: (cm) 8.2 Volume: (cm) 4.1 % Reduction in Area: 6.8% % Reduction in Volume: 6.8% Epithelialization: Small (1-33%) Tunneling: Yes Position (o'clock): 4 Maximum Distance: (cm) 1.5 Undermining:  No Wound Description Classification: Grade 2 Wound Margin: Well defined, not attached Exudate Amount: Medium Exudate Type: Serosanguineous Exudate Color: red, brown Foul Odor After Cleansing: No Slough/Fibrino No Wound Bed Granulation Amount: Large (67-100%) Exposed Structure Granulation Quality: Red, Pink Fascia Exposed: No Necrotic Amount: Small (1-33%) Fat Layer (Subcutaneous Tissue) Exposed: Yes Necrotic Quality: Adherent Slough Tendon Exposed: No Muscle Exposed: No Joint Exposed: No Bone Exposed: No Treatment Notes Wound #5 (Metatarsal head fifth) Wound Laterality:  Right, Lateral Cleanser Soap and Water Discharge Instruction: May shower and wash wound with dial antibacterial soap and water prior to dressing change. Peri-Wound Care Topical Primary Dressing Hydrofera Blue Classic Foam, 4x4 in Discharge Instruction: Moisten with saline prior to applying to wound bed Secondary Dressing ABD Pad, 8x10 Discharge Instruction: Apply over primary dressing as directed. Optifoam Non-Adhesive Dressing, 4x4 in Discharge Instruction: Apply over primary dressing, cut to make foam donut to offload Secured With [employer] Sterile, 4.5x3.1 (in/yd) Discharge Instruction: Secure with Kerlix as directed. 53M Medipore H Soft Cloth Surgical T 4 x 2 (in/yd) ape Discharge Instruction: Secure dressing with tape as directed. Compression Wrap Compression Stockings Add-Ons Electronic Signature(s) Signed: 08/03/2020 5:00:30 PM By: Sandre Kitty Signed: 08/03/2020 5:39:21 PM By: [name] RN, BSN Entered By: Sandre Kitty on 08/03/2020 16:52:47 -------------------------------------------------------------------------------- Vitals Details Patient Name: Date of Service: [name], [name] 08/03/2020 12:30 PM Medical Record Number: [id number] Patient Account Number: [id number] Date of Birth/Sex: Treating RN: 1985/10/03 (35 y.o. [name] Primary Care Trevel Dillenbeck: [name] Other Clinician: Referring Ruvi Fullenwider: Treating Dekker Verga/Extender: [name] in Treatment: 40 Vital Signs Time Taken: 12:41 Temperature (F): 97.8 Height (in): 77 Pulse (bpm): 105 Weight (lbs): 280 Respiratory Rate (breaths/min): 18 Body Mass Index (BMI): 33.2 Blood Pressure (mmHg): 146/90 Capillary Blood Glucose (mg/dl): 180 Reference Range: 80 - 120 mg / dl Electronic Signature(s) Signed: 08/03/2020 5:00:30 PM By: Sandre Kitty Entered By: Sandre Kitty on 08/03/2020 12:41:46

## 2020-08-03 NOTE — Progress Notes (Signed)
[name], Max ([id number]) Visit Report for 08/03/2020 HPI Details Patient Name: Date of Service: [name] 08/03/2020 12:30 PM Medical Record Number: [id number] Patient Account Number: [id number] Date of Birth/Sex: Treating RN: [birth year] (35 y.o. 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 He comes in for review of a wound over the plantar fifth metatarsal head extending into the lateral part of the foot. He was followed for this previously by his podiatrist Dr. Cornelius Moras. As the patient tells his story he went to see podiatry first for a swelling he developed on the lateral part of his fifth metatarsal head in May. He states this was "open" by podiatry and the area closed. He was followed up in June and it was again opened callus removed and it closed promptly. There were plans being made for surgery on the fifth metatarsal head in June however his blood sugar was apparently too high for anesthesia. Apparently the area was debrided and opened again in June and it is never closed since. Looking over the records from podiatry I am really not able to follow this. It was clear when he was first seen it was before 5/14 at that point he already had a wound. By 5/17 the ulcer was resolved. I do not see anything about a procedure. On 5/28 noted to have pre-ulcerative moderate keratosis. X-ray noted 1/5 contracted toe and tailor's bunion and metatarsal deformity. On a visit date on 09/28/2018 the dorsal part of the left foot it healed and resolved. There was concern about swelling in his lower extremity he was sent to the ER.. As far as I can tell he was seen in the ER on 7/12 with an ulcer on his left foot. A DVT rule out  of the left leg was negative. I do not think I have complete records from podiatry but I am not able to verify the procedures this patient states he had. He states after the last procedure the wound has never closed although I am not able to follow this in the records I have from podiatry. He has not had a recent x-ray The patient has been using Neosporin on the wound. He is wearing a Darco shoe. He is still very active up on his foot working and exercising. Past medical history; type 2 diabetes ketosis-prone, leg swelling with a negative DVT study in July. Non-smoker ABI in our clinic was 0.85 on the left 10/16; substantial wound on the plantar left fifth met head extending laterally almost to the dorsal fifth MTP. We have been using silver alginate we gave him a Darco forefoot off loader. An x-ray did not show evidence of osteomyelitis did note soft tissue emphysema which I think was due to gas tracking through an open wound. There is no doubt in my mind he requires an MRI 10/23; MRI not booked until 3 November at the earliest this is largely due to his glucose sensor in the right arm. We have been using silver alginate. There has been an improvement 10/29; I am still not exactly sure when his MRI is booked for. He says it is the third but it is the 10th in epic. This definitely needs to be done. He is running a low-grade fever today but no other symptoms. No real improvement  in the 1 02/26/2019 patient presents today for a follow-up visit here in our clinic he is last been seen in the clinic on October 29. Subsequently we were working on getting MRI to evaluate and see what exactly was going on and where we would need to go from the standpoint of whether or not he had osteomyelitis and again what treatments were going be required. Subsequently the patient ended up being admitted to the hospital on 02/07/2019 and was discharged on 02/14/2019. This is a somewhat interesting admission with a discharge  diagnosis of pneumonia due to COVID-19 although he was positive for COVID-19 when tested at the urgent care but negative x2 when he was actually in the hospital. With that being said he did have acute respiratory failure with hypoxia and it was noted he also have a left foot ulceration with osteomyelitis. With that being said he did require oxygen for his pneumonia and I level 4 L. He was placed on antivirals and steroids for the COVID-19. He was also transferred to the [facility] at one point. Nonetheless he did subsequently discharged home and since being home has done much better in that regard. The CT angiogram did not show any pulmonary embolism. With regard to the osteomyelitis the patient was placed on vancomycin and Zosyn while in the hospital but has been changed to Augmentin at discharge. It was also recommended that he follow- up with wound care and podiatry. Podiatry however wanted him to see [name] according to the patient prior to them doing anything further. His hemoglobin A1c was 9.9 as noted in the hospital. Have an MRI of the left foot performed while in the hospital on 02/04/2019. 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 There is an overlying plantar open wound noted an abscess tracking back along the lateral aspect of the fifth metatarsal shaft. There is otherwise diffuse cellulitis and mild fasciitis without findings of polymyositis. The patient did have recently pneumonia secondary to COVID-19 I looked in the chart through epic and it does appear that the patient may need to have an additional x-ray just to ensure everything is cleared and that he has no airspace disease prior to putting him into the [name]. 03/05/2019; patient was readmitted to the clinic last week. He was hospitalized twice for a viral upper respiratory tract infection from 11/1 through 11/4 and then 11/5  through 11/12 ultimately this turned out to be Covid pneumonitis. Although he was discharged on oxygen he is not using it. He says he feels fine. He has no exercise limitation no cough no sputum. His O2 sat in our clinic today was 100% on room air. 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 He received Vanco and Zosyn in the hospital and then was discharged on 2 weeks of Augmentin. I do not see any relevant cultures. He was supposed to follow-up with infectious disease but I do not see that he has an appointment. 12/8; patient saw Dr. Novella Olive of infectious disease last week. He felt that he had had adequate antibiotic therapy. He did not go to follow-up with Dr. Amalia Hailey of podiatry and I have again talked to him about the pros and cons of this. He does not want to consider a ray amputation of this time. He is aware of the risks of  recurrence, migration etc. He started HBO today and tolerated this well. He can complete the Augmentin that I gave him last week. I have looked over the lab work that Dr. Chana Bode ordered his C-reactive protein was 3.3 and his sedimentation rate was 17. The C-reactive protein is never really been measurably that high in this patient 12/15; not much change in the wound today however he has undermining along the lateral part of the foot again more extensively than last week. He has some rims of epithelialization. We have been using silver alginate. He is undergoing hyperbarics but did not dive today 12/18; in for his obligatory first total contact cast change. Unfortunately there was pus coming from the undermining area around his fifth metatarsal head. This was cultured but will preclude reapplication of a cast. He is seen in conjunction with HBO 12/24; patient had staph lugdunensis in the wound in the undermining area laterally last time. We put him on doxycycline  which should have covered this. The wound looks better today. I am going to give him another week of doxycycline before reattempting the total contact cast 12/31; the patient is completing antibiotics. Hemorrhagic debris in the distal part of the wound with some undermining distally. He also had hyper granulation. Extensive debridement with a #5 curette. The infected area that was on the lateral part of the fifth met head is closed over. I do not think he needs any more antibiotics. Patient was seen prior to HBO. Preparations for a total contact cast were made in the cast will be placed post hyperbarics 04/11/19; once again the patient arrives today without complaint. He had been in a cast all week noted that he had heavy drainage this week. This resulted in large raised areas of macerated tissue around the wound 1/14; wound bed looks better slightly smaller. Hydrofera Blue has been changing himself. He had a heavy drainage last week which caused a lot of maceration around the wound so I took him out of a total contact cast he says the drainage is actually better this week He is seen today in conjunction with HBO 1/21; returns to clinic. He was up in [place] for a day or 2 attending a funeral. He comes back in with the wound larger and with a large area of exposed bone. He had osteomyelitis and septic arthritis of the fifth left metatarsal head while he was in hospital. He received IV antibiotics in the hospital for a prolonged period of time then 3 weeks of Augmentin. Subsequently I gave him 2 weeks of doxycycline for more superficial wound infection. When I saw this last week the wound was smaller the surface of the wound looks satisfactory. 1/28; patient missed hyperbarics today. Bone biopsy I did last time showed Enterococcus faecalis and Staphylococcus lugdunensis . He has a wide area of exposed bone. We are going to use silver alginate as of today. I had another ethical discussion with the  patient. 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 In this situation I think the likelihood of healing this is low. Therefore I have recommended a ray amputation and with the patient's agreement I have referred him to Dr. Doran Durand. The other issue is that his compliance with hyperbarics has been minimal because of his work schedule and given his underlying decision I am going to stop this today READMISSION 10/24/2019 MRI 09/29/2019 left foot IMPRESSION: 1. Apparent skin ulceration inferior and lateral to the 5th metatarsal base with underlying heterogeneous T2 signal  and enhancement in the subcutaneous fat. Small peripherally enhancing fluid collections along the plantar and lateral aspects of the 5th metatarsal base suspicious for abscesses. 2. Interval amputation through the mid 5th metatarsal with nonspecific low-level marrow edema and enhancement. Given the proximity to the adjacent soft tissue inflammatory changes, osteomyelitis cannot be excluded. 3. The additional bones appear unremarkable. MRI 09/29/2019 right foot IMPRESSION: 1. Soft tissue ulceration lateral to the 5th MTP joint. There is low-level T2 hyperintensity within the 4th and 5th metatarsal heads and adjacent proximal phalanges without abnormal T1 signal or cortical destruction. These findings are nonspecific and could be seen with early marrow edema, hyperemia or early osteomyelitis. No evidence of septic joint. 2. Mild tenosynovitis and synovial enhancement associated with the extensor digitorum tendons at the level of the midfoot. 3. Diffuse low-level muscular T2 hyperintensity and enhancement, most consistent with diabetic myopathy. LEFT FOOT BONE Methicillin resistant staphylococcus aureus Staphylococcus lugdunensis MIC MIC CIPROFLOXACIN >=8 RESISTANT Resistant <=0.5 SENSI... Sensitive CLINDAMYCIN <=0.25 SENS... Sensitive >=8 RESISTANT Resistant ERYTHROMYCIN >=8 RESISTANT  Resistant >=8 RESISTANT Resistant GENTAMICIN <=0.5 SENSI... Sensitive <=0.5 SENSI... Sensitive Inducible Clindamycin NEGATIVE Sensitive NEGATIVE Sensitive OXACILLIN >=4 RESISTANT Resistant 2 SENSITIVE Sensitive RIFAMPIN <=0.5 SENSI... Sensitive <=0.5 SENSI... Sensitive TETRACYCLINE <=1 SENSITIVE Sensitive <=1 SENSITIVE Sensitive TRIMETH/SULFA <=10 SENSIT Sensitive <=10 SENSIT Sensitive ... Marland Kitchen.. VANCOMYCIN 1 SENSITIVE Sensitive <=0.5 SENSI... Sensitive Right foot bone . Component 3 wk ago Specimen Description BONE Special Requests RIGHT 4 METATARSAL SAMPLE B Gram Stain NO WBC SEEN NO ORGANISMS SEEN Culture RARE METHICILLIN RESISTANT STAPHYLOCOCCUS AUREUS NO ANAEROBES ISOLATED Performed at [facility], [place] [address], [place], [place] [address] Report Status 10/08/2019 FINAL Organism ID, Bacteria METHICILLIN RESISTANT STAPHYLOCOCCUS AUREUS Resulting Agency [facility] Susceptibility Methicillin resistant staphylococcus aureus MIC CIPROFLOXACIN >=8 RESISTANT Resistant CLINDAMYCIN <=0.25 SENS... Sensitive ERYTHROMYCIN >=8 RESISTANT Resistant GENTAMICIN <=0.5 SENSI... Sensitive Inducible Clindamycin NEGATIVE Sensitive OXACILLIN >=4 RESISTANT Resistant RIFAMPIN <=0.5 SENSI... Sensitive TETRACYCLINE <=1 SENSITIVE Sensitive TRIMETH/SULFA <=10 SENSIT Sensitive ... VANCOMYCIN 1 SENSITIVE Sensitive This is a patient we had in clinic earlier this year with a wound over his left fifth metatarsal head. 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 Doran Durand on 05/16/2019. He seems to have developed problems with wounds on his bilateral feet in June 2021 although he may have had problems earlier than this. He was  in an urgent care with a right foot ulcer on 09/26/2019 and given a course of doxycycline. This was apparently after having trouble getting into see orthopedics. He was seen by podiatry on 09/28/2019 noted to have bilateral lower extremity ulcers including the left lateral fifth metatarsal base and the right subfifth met head. It was noted that had purulent drainage at that time. He required hospitalization from 6/20 through 7/2. This was because of worsening right foot wounds. He underwent bilateral operative incision and drainage and bone biopsies bilaterally. Culture results are listed above. He has been referred back to clinic by Dr. Jacqualyn Posey of podiatry. He is also followed by Dr. Megan Salon who saw him yesterday. He was discharged from hospital on Zyvox Flagyl and Levaquin and yesterday changed to doxycycline Flagyl and Levaquin. His inflammatory markers on 6/26 showed a sedimentation rate of 129 and a C-reactive protein of 5. This is  improved to 14 and 1.3 respectively. This would indicate improvement. ABIs in our clinic today were 1.23 on the right and 1.20 on the left 11/01/2019 on evaluation today patient appears to be doing fairly well in regard to the wounds on his feet at this point. Fortunately there is no signs of active infection at this time. No fevers, chills, nausea, vomiting, or diarrhea. He currently is seeing infectious disease and still under their care at this point. Subsequently he also has both wounds which she has not been using collagen on as he did not receive that in his packaging he did not call us and let us know that. Apparently that just was missed on the order. Nonetheless we will get that straightened out today. 8/9-Patient returns for bilateral foot wounds, using Prisma with hydrogel moistened dressings, and the wounds appear stable. Patient using surgical shoes, avoiding much pressure or weightbearing as much as possible 8/16; patient has bilateral foot wounds. 1 on the  right lateral foot proximally the other is on the left mid lateral foot. Both required debridement of callus and thick skin around the wounds. We have been using silver collagen 8/27; patient has bilateral lateral foot wounds. The area on the left substantially surrounded by callus and dry skin. This was removed from the wound edge. The underlying wound is small. The area on the right measured somewhat smaller today. 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 Unfortunately I did not update his antibiotics during today's visit. 9/10 I reviewed Dr. Hale Bogus last notes he felt he had completed antibiotics his inflammatory markers were reasonably well controlled. He has a small wound on the lateral left foot and a tiny area on the right which is just above closed. He is using Hydrofera Blue with border foam he has bilateral surgical shoes 9/24; 2 week f/u. doing well. right foot is closed. left foot still undermined. 10/14; right foot remains closed at the fifth met head. The area over the base of the left fifth metatarsal has a small open area but considerable undermining towards the plantar foot. Thick callus skin around this suggests an adequate pressure relief. We have talked about this. He says he is going to go back into his cam boot. I suggested a total contact cast he did not seem enamored with this suggestion 10/26; left foot base of the fifth metatarsal. Same condition as last time. He has skin over the area with an open wound however the skin is not adherent. He went to see Dr. Earleen Newport who did an x-ray and culture of his foot I have not reviewed the x-ray but the patient was not told anything. He is on doxycycline 11/11; since the patient was last here he was in the emergency room on 10/30 he was concerned about swelling in the left foot. They did not do any cultures or x-rays. They changed his antibiotics to cephalexin.  Previous culture showed group B strep. The cephalexin is appropriate as doxycycline has less than predictable coverage. Arrives in clinic today with swelling over this area under the wound. He also has a new wound on the right fifth metatarsal head 11/18; the patient has a difficult wound on the lateral aspect of the left fifth metatarsal head. The wound was almost ballotable last week I opened it slightly expecting to see purulence however there was just bleeding. I cultured this this was negative. X-ray unchanged. We are trying to get an MRI  but I am not sure were going to be able to get this through his insurance. He also has an area on the right lateral fifth metatarsal head this looks healthier 12/3; the patient finally got our MRI. Surprisingly this did not show osteomyelitis. I did show the soft tissue ulceration at the lateral plantar aspect of the fifth metatarsal base with a tiny residual 6 mm abscess overlying the superficial fascia I have tried to culture this area I have not been able to get this to grow anything. Nevertheless the protruding tissue looks aggravated. I suspect we should try to treat the underlying "abscess with broad-spectrum antibiotics. I am going to start him on Levaquin and Flagyl. He has much less edema in his legs and I am going to continue to wrap his legs and see him weekly 12/10. I started Levaquin and Flagyl on him last week. He just picked up the Flagyl apparently there was some delay. The worry is the wound on the left fifth metatarsal base which is substantial and worsening. His foot looks like he inverts at the ankle making this a weightbearing surface. Certainly no improvement in fact I think the measurements of this are somewhat worse. We have been using 12/17; he apparently just got the Levaquin yesterday this is 2 weeks after the fact. He has completed the Flagyl. The area over the left fifth metatarsal base still has protruding granulation tissue although  it does not look quite as bad as it did some weeks ago. He has severe bilateral lymphedema although we have not been treating him for wounds on his legs this is definitely going to require compression. There was so much edema in the left I did not wish to put him in a total contact cast today. I am going to increase his compression from 3-4 layer. The area on the right lateral fifth met head actually look quite good and superficial. 12/23; patient arrived with callus on the right fifth met head and the substantial hyper granulated callused wound on the base of his fifth metatarsal. He says he is completing his Levaquin in 2 days but I do not think that adds up with what I gave him but I will have to double check this. We are using Hydrofera Blue on both areas. My plan is to put the left leg in a cast the week after New Year's 04/06/2020; patient's wounds about the same. Right lateral fifth metatarsal head and left lateral foot over the base of the fifth metatarsal. There is undermining on the left lateral foot which I removed before application of total contact cast continuing with Hydrofera Blue new. Patient tells me he was seen by endocrinology today lab work was done [Dr. Kerr]. Also wondering whether he was referred to cardiology. I went over some lab work from previously does not have chronic renal failure certainly not nephrotic range proteinuria he does have very poorly controlled diabetes but this is not his most updated lab work. Hemoglobin A1c has been over 11 1/10; the patient had a considerable amount of leakage towards mid part of his left foot with macerated skin however the wound surface looks better the area on the right lateral fifth met head is better as well. I am going to change the dressing on the left foot under the total contact cast to silver alginate, continue with Hydrofera Blue on the right. 1/20; patient was in the total contact cast for 10 days. Considerable amount of  drainage although the skin around  the wound does not look too bad on the left foot. The area on the right fifth metatarsal head is closed. Our nursing staff reports large amount of drainage out of the left lateral foot wound 1/25; continues with copious amounts of drainage described by our intake staff. PCR culture I did last week showed E. coli and Enterococcus faecalis and low quantities. Multiple resistance genes documented including extended spectrum beta lactamase, MRSA, MRSE, quinolone, tetracycline. The wound is not quite as good this week as it was 5 days ago but about the same size 2/3; continues with copious amounts of malodorous drainage per our intake nurse. The PCR culture I did 2 weeks ago showed E. coli and low quantities of Enterococcus. There were multiple resistance genes detected. I put Neosporin on him last week although this does not seem to have helped. The wound is slightly deeper today. Offloading continues to be an issue here although with the amount of drainage she has a total contact cast is just not going to work 2/10; moderate amount of drainage. Patient reports he cannot get his stocking on over the dressing. I told him we have to do that the nurse gave him suggestions on how to make this work. The wound is on the bottom and lateral part of his left foot. Is cultured predominantly grew low amounts of Enterococcus, E. coli and anaerobes. There were multiple resistance genes detected including extended spectrum beta lactamase, quinolone, tetracycline. I could not think of an easy oral combination to address this so for now I am going to do topical antibiotics provided by [facility] I think the main agents here are vancomycin and an aminoglycoside. We have to be able to give him access to the wounds to get the topical antibiotic on 2/17; moderate amount of drainage this is unchanged. He has his Keystone topical antibiotic against the deep tissue culture organisms. He has been  using this and changing the dressing daily. Silver alginate on the wound surface. 2/24; using Keystone antibiotic with silver alginate on the top. He had too much drainage for a total contact cast at one point although I think that is improving and I think in the next week or 2 it might be possible to replace a total contact cast I did not do this today. In general the wound surface looks healthy however he continues to have thick rims of skin and subcutaneous tissue around the wide area of the circumference which I debrided 06/04/2020 upon evaluation today patient appears to be doing well in regard to his wound. I do feel like he is showing signs of improvement. There is little bit of callus and dead tissue around the edges of the wound as well as what appears to be a little bit of a sinus tract that is off to the side laterally I would perform debridement to clear that away today. 3/17; left lateral foot. The wound looks about the same as I remember. Not much depth surface looks healthy. No evidence of infection 3/25; left lateral foot. Wound surface looks about the same. Separating epithelium from the circumference. There really is no evidence of infection here however not making progress by my view 3/29; left lateral foot. Surface of the wound again looks reasonably healthy still thick skin and subcutaneous tissue around the wound margins. There is no evidence of infection. One of the concerns being brought up by the nurses has again the amount of drainage vis--vis continued use of a total contact cast 4/5; left  lateral foot at roughly the base of the fifth metatarsal. Nice healthy looking granulated tissue with rims of epithelialization. The overall wound measurements are not any better but the tissue looks healthy. The only concern is the amount of drainage although he has no surrounding maceration with what we have been doing recently to absorb fluid and protect his skin. He also has lymphedema.  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 He states that the kerlix/coban under the TCC rolled down and could not get it back up. He has been using an offloading scooter and has somehow hurt his right foot using this device. This happened last week. He states that the side of his right foot developed a blister and opened. The top of his foot also has a few small open wounds he thinks is due to his socks rubbing in his shoes. He has not been using any dressings to the wound. He denies purulent drainage, fever/chills or erythema to the wounds. 4/22; patient presents for 1 week follow-up. He developed new wounds to the right foot that were evaluated at last clinic visit. He continues to have a total contact cast to the left leg and he reports no issues. He has been using silver collagen to the right foot wounds with no issues. He denies purulent drainage, fever/chills or erythema to the right foot wounds. He has no complaints today 4/25; patient presents for 1 week follow-up. He has a total contact cast of the left leg and reports no issues. He has been using silver alginate to the right foot wound. He denies purulent drainage, fever/chills or erythema to the right foot wounds. 5/2 patient presents for 1 week follow-up. T contact cast on the left. The wound which is on the base of the plantar foot at the base of the fifth metatarsal otal actually looks quite good and dimensions continue to gradually contract. HOWEVER the area on the right lateral fifth metatarsal head is much larger than what I remember from 2 weeks ago. Once more is he has significant levels of hypergranulation. Noteworthy that he had this same hyper granulated response on his wound on the  left foot at one point in time. So much so that he I thought there was an underlying fluid collection. Based on this I think this just needs debridement. Electronic Signature(s) Signed: 08/03/2020 5:25:01 PM By: [name] MD Entered By: [name] on 08/03/2020 13:26:56 -------------------------------------------------------------------------------- Chemical Cauterization Details Patient Name: Date of Service: [name], [name] 08/03/2020 12:30 PM Medical Record Number: [id number] Patient Account Number: [id number] Date of Birth/Sex: Treating RN: 04/16/1985 (35 y.o. [name] Primary Care Provider: [name] Other Clinician: Referring Provider: Treating Provider/Extender: [name] in Treatment: 40 Procedure Performed for: Wound #5 Right,Lateral Metatarsal head fifth Performed By: Physician [name], MD Post Procedure Diagnosis Same as Pre-procedure Notes silver nitrate Electronic Signature(s) Signed: 08/03/2020 5:25:01 PM By: [name] MD Signed: 08/03/2020 5:39:21 PM By: [name] RN, BSN Entered By: [name] on 08/03/2020 13:18:14 -------------------------------------------------------------------------------- Physical Exam Details Patient Name: Date of Service: [name], [name] 08/03/2020 12:30 PM Medical Record Number: [id number] Patient Account Number: [id number] Date of Birth/Sex: Treating RN: 1985-12-19 (35 y.o. M)  [name] Primary Care Provider: [name] Other Clinician: Referring Provider: Treating Provider/Extender: [name] in Treatment: 40 Constitutional Sitting or standing Blood Pressure is within target range for patient.. Pulse regular and within target range for patient.Marland Kitchen Respirations regular, non-labored and within target range.. Temperature is normal and within the target range for the patient.Marland Kitchen Appears in no distress. Notes Wound exam Left plantar  foot at the level of the base of the fifth metatarsal. This actually looks quite good. There is some depth but the tissue looks good. No debridement is required. I am hopeful to have this fill into a surface wound The right foot wound on the base of the fifth metatarsal and once again has an extremely hyper granulated response. I used aggressive silver nitrate on this but this may require a full mechanical debridement Electronic Signature(s) Signed: 08/03/2020 5:25:01 PM By: [name] MD Entered By: [name] on 08/03/2020 13:28:25 -------------------------------------------------------------------------------- Physician Orders Details Patient Name: Date of Service: [name], [name] 08/03/2020 12:30 PM Medical Record Number: [id number] Patient Account Number: [id number] Date of Birth/Sex: Treating RN: [birth year] (35 y.o. [name] Primary Care Provider: [name] Other Clinician: Referring Provider: Treating Provider/Extender: [name] in Treatment: 49 Verbal / Phone Orders: No Diagnosis Coding ICD-10 Coding Code Description E11.621 Type 2 diabetes mellitus with foot ulcer L97.528 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ity L97.518 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ity Follow-up Appointments ppointment in 1 week. - Dr. Dellia Nims Return A Bathing/ Shower/ Hygiene May shower with protection but do not get wound dressing(s) wet. - use a cast protector to left leg. May shower and wash wound with soap and water. - with dressing changes only to right foot. Edema Control - Lymphedema / SCD / Other Bilateral Lower Extremities Elevate legs to the level of the heart or above for 30 minutes daily and/or when sitting, a frequency of: - throughout the day Avoid standing for long periods of time. Exercise regularly Moisturize legs daily. - right leg every night  before bed. Compression stocking or Garment 20-30 mm/Hg pressure to: - Apply to right leg in the morning and remove at night. Off-Loading Total Contact Cast to Left Lower Extremity Open toe surgical shoe to: - right foot Wound Treatment Wound #3 - Foot Wound Laterality: Left, Lateral Cleanser: Soap and Water 1 x Per Week/30 Days Discharge Instructions: May shower and wash wound with dial antibacterial soap and water prior to dressing change. Peri-Wound Care: Zinc Oxide Ointment 30g tube 1 x Per Week/30 Days Discharge Instructions: Apply Zinc Oxide to periwound with each dressing change as needed. Prim Dressing: Hydrofera Blue Classic Foam, 2x2 in 1 x Per Week/30 Days ary Discharge Instructions: Moisten with saline prior to applying to wound bed Secondary Dressing: Woven Gauze Sponge, Non-Sterile 4x4 in 1 x Per Week/30 Days Discharge Instructions: Apply over primary dressing as directed. Secondary Dressing: ABD Pad, 8x10 1 x Per Week/30 Days Discharge Instructions: T down before applying kerlix. Apply over primary dressing as directed. ape Secondary Dressing: Zetuvit Plus 4x8 in 1 x Per Week/30 Days Discharge Instructions: Apply over primary dressing as directed. Secured With: [employer], 4.5x3.1 (in/yd) 1 x Per Week/30 Days Discharge Instructions: Secure with Kerlix as directed. Wound #5 - Metatarsal head fifth Wound Laterality: Right, Lateral Cleanser: Normal Saline (DME) (Generic) Every Other Day/15 Days Discharge Instructions: Cleanse the wound with Normal Saline  prior to applying a clean dressing using gauze sponges, not tissue or cotton balls. Cleanser: Soap and Water Every Other Day/15 Days Discharge Instructions: May shower and wash wound with dial antibacterial soap and water prior to dressing change. Prim Dressing: Hydrofera Blue Classic Foam, 4x4 in (DME) (Generic) Every Other Day/15 Days ary Discharge Instructions: Moisten with saline prior to applying to wound  bed Secondary Dressing: ABD Pad, 8x10 (DME) (Generic) Every Other Day/15 Days Discharge Instructions: Apply over primary dressing as directed. Secondary Dressing: Optifoam Non-Adhesive Dressing, 4x4 in (DME) (Generic) Every Other Day/15 Days Discharge Instructions: Apply over primary dressing, cut to make foam donut to offload Secured With: Kerlix Roll Sterile, 4.5x3.1 (in/yd) (DME) (Generic) Every Other Day/15 Days Discharge Instructions: Secure with Kerlix as directed. Secured With: 26M Medipore H Soft Cloth Surgical Tape, 2x2 (in/yd) (DME) (Generic) Every Other Day/15 Days Discharge Instructions: Secure dressing with tape as directed. Electronic Signature(s) Signed: 08/03/2020 5:25:01 PM By: [name] MD Signed: 08/03/2020 5:39:21 PM By: [name] RN, BSN Entered By: [name] on 08/03/2020 15:19:23 -------------------------------------------------------------------------------- Problem List Details Patient Name: Date of Service: [name], [name] 08/03/2020 12:30 PM Medical Record Number: [id number] Patient Account Number: [id number] Date of Birth/Sex: Treating RN: [birth year] (35 y.o. [name] Primary Care Provider: [name] Other Clinician: Referring Provider: Treating Provider/Extender: [name] in Treatment: 47 Active Problems ICD-10 Encounter Code Description Active Date MDM Diagnosis E11.621 Type 2 diabetes mellitus with foot ulcer 10/24/2019 No Yes L97.528 Non-pressure chronic ulcer of other part of left foot with other specified 10/24/2019 No Yes severity L97.518 Non-pressure chronic ulcer of other part of right foot with other specified 07/14/2020 No Yes severity Inactive Problems ICD-10 Code Description Active Date Inactive Date L97.518 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ity 10/24/2019 10/24/2019 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, right ankle and foot 10/24/2019  10/24/2019 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, left ankle and foot 10/24/2019 10/24/2019 B95.62 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 10/24/2019 10/24/2019 classified elsewhere Resolved Problems Electronic Signature(s) Signed: 08/03/2020 5:25:01 PM By: [name] MD Entered By: [name] on 08/03/2020 13:22:55 -------------------------------------------------------------------------------- Progress Note Details Patient Name: Date of Service: [name], [name] 08/03/2020 12:30 PM Medical Record Number: [id number] Patient Account Number: [id number] Date of Birth/Sex: Treating RN: Oct 03, 1985 (35 y.o. 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 He comes in for review of a wound over the plantar fifth metatarsal head extending into the lateral part of the foot. He was followed for this previously by his podiatrist Dr. Cornelius Moras. As the patient tells his story he went to see podiatry first for a swelling he developed on the lateral part of his fifth metatarsal head in May. He states this was "open" by podiatry and the area closed. He was followed up in June and it was again opened callus removed and it closed promptly. There were plans being made for surgery on the fifth metatarsal head in June however his blood sugar was apparently too high for anesthesia. Apparently the area was debrided and opened again in June and it is never closed since. Looking over the records from podiatry I am really not able to follow this. It was clear when he was  first seen it was before 5/14 at that point he already had a wound. By 5/17 the ulcer was resolved. I do not see anything  about a procedure. On 5/28 noted to have pre-ulcerative moderate keratosis. X-ray noted 1/5 contracted toe and tailor's bunion and metatarsal deformity. On a visit date on 09/28/2018 the dorsal part of the left foot it healed and resolved. There was concern about swelling in his lower extremity he was sent to the ER.. As far as I can tell he was seen in the ER on 7/12 with an ulcer on his left foot. A DVT rule out of the left leg was negative. I do not think I have complete records from podiatry but I am not able to verify the procedures this patient states he had. He states after the last procedure the wound has never closed although I am not able to follow this in the records I have from podiatry. He has not had a recent x-ray The patient has been using Neosporin on the wound. He is wearing a Darco shoe. He is still very active up on his foot working and exercising. Past medical history; type 2 diabetes ketosis-prone, leg swelling with a negative DVT study in July. Non-smoker ABI in our clinic was 0.85 on the left 10/16; substantial wound on the plantar left fifth met head extending laterally almost to the dorsal fifth MTP. We have been using silver alginate we gave him a Darco forefoot off loader. An x-ray did not show evidence of osteomyelitis did note soft tissue emphysema which I think was due to gas tracking through an open wound. There is no doubt in my mind he requires an MRI 10/23; MRI not booked until 3 November at the earliest this is largely due to his glucose sensor in the right arm. We have been using silver alginate. There has been an improvement 10/29; I am still not exactly sure when his MRI is booked for. He says it is the third but it is the 10th in epic. This definitely needs to be done. He is running a low-grade fever today but no other symptoms. No real improvement in the 1 02/26/2019 patient presents today for a follow-up visit here in our clinic he is last been seen in the  clinic on October 29. Subsequently we were working on getting MRI to evaluate and see what exactly was going on and where we would need to go from the standpoint of whether or not he had osteomyelitis and again what treatments were going be required. Subsequently the patient ended up being admitted to the hospital on 02/07/2019 and was discharged on 02/14/2019. This is a somewhat interesting admission with a discharge diagnosis of pneumonia due to COVID-19 although he was positive for COVID-19 when tested at the urgent care but negative x2 when he was actually in the hospital. With that being said he did have acute respiratory failure with hypoxia and it was noted he also have a left foot ulceration with osteomyelitis. With that being said he did require oxygen for his pneumonia and I level 4 L. He was placed on antivirals and steroids for the COVID-19. He was also transferred to the [facility] at one point. Nonetheless he did subsequently discharged home and since being home has done much better in that regard. The CT angiogram did not show any pulmonary embolism. With regard to the osteomyelitis the patient was placed on vancomycin and Zosyn while in the hospital but has been changed  to Augmentin at discharge. It was also recommended that he follow- up with wound care and podiatry. Podiatry however wanted him to see [name] according to the patient prior to them doing anything further. His hemoglobin A1c was 9.9 as noted in the hospital. Have an MRI of the left foot performed while in the hospital on 02/04/2019. 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 There is an overlying plantar open wound noted an abscess tracking back along the lateral aspect of the fifth metatarsal shaft. There is otherwise diffuse cellulitis and mild fasciitis without findings of polymyositis. The patient did have recently  pneumonia secondary to COVID-19 I looked in the chart through epic and it does appear that the patient may need to have an additional x-ray just to ensure everything is cleared and that he has no airspace disease prior to putting him into the [name]. 03/05/2019; patient was readmitted to the clinic last week. He was hospitalized twice for a viral upper respiratory tract infection from 11/1 through 11/4 and then 11/5 through 11/12 ultimately this turned out to be Covid pneumonitis. Although he was discharged on oxygen he is not using it. He says he feels fine. He has no exercise limitation no cough no sputum. His O2 sat in our clinic today was 100% on room air. 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 He received Vanco and Zosyn in the hospital and then was discharged on 2 weeks of Augmentin. I do not see any relevant cultures. He was supposed to follow-up with infectious disease but I do not see that he has an appointment. 12/8; patient saw Dr. Novella Olive of infectious disease last week. He felt that he had had adequate antibiotic therapy. He did not go to follow-up with Dr. Amalia Hailey of podiatry and I have again talked to him about the pros and cons of this. He does not want to consider a ray amputation of this time. He is aware of the risks of recurrence, migration etc. He started HBO today and tolerated this well. He can complete the Augmentin that I gave him last week. I have looked over the lab work that Dr. Chana Bode ordered his C-reactive protein was 3.3 and his sedimentation rate was 17. The C-reactive protein is never really been measurably that high in this patient 12/15; not much change in the wound today however he has undermining along the lateral part of the foot again more extensively than last week. He has some rims of epithelialization. We have been using silver alginate. He is  undergoing hyperbarics but did not dive today 12/18; in for his obligatory first total contact cast change. Unfortunately there was pus coming from the undermining area around his fifth metatarsal head. This was cultured but will preclude reapplication of a cast. He is seen in conjunction with HBO 12/24; patient had staph lugdunensis in the wound in the undermining area laterally last time. We put him on doxycycline which should have covered this. The wound looks better today. I am going to give him another week of doxycycline before reattempting the total contact cast 12/31; the patient is completing antibiotics. Hemorrhagic debris in the distal part of the wound with some undermining distally. He also had hyper granulation. Extensive debridement with a #5 curette. The infected area that was on the lateral part of the  fifth met head is closed over. I do not think he needs any more antibiotics. Patient was seen prior to HBO. Preparations for a total contact cast were made in the cast will be placed post hyperbarics 04/11/19; once again the patient arrives today without complaint. He had been in a cast all week noted that he had heavy drainage this week. This resulted in large raised areas of macerated tissue around the wound 1/14; wound bed looks better slightly smaller. Hydrofera Blue has been changing himself. He had a heavy drainage last week which caused a lot of maceration around the wound so I took him out of a total contact cast he says the drainage is actually better this week He is seen today in conjunction with HBO 1/21; returns to clinic. He was up in [place] for a day or 2 attending a funeral. He comes back in with the wound larger and with a large area of exposed bone. He had osteomyelitis and septic arthritis of the fifth left metatarsal head while he was in hospital. He received IV antibiotics in the hospital for a prolonged period of time then 3 weeks of Augmentin. Subsequently I gave  him 2 weeks of doxycycline for more superficial wound infection. When I saw this last week the wound was smaller the surface of the wound looks satisfactory. 1/28; patient missed hyperbarics today. Bone biopsy I did last time showed Enterococcus faecalis and Staphylococcus lugdunensis . He has a wide area of exposed bone. We are going to use silver alginate as of today. I had another ethical discussion with the patient. 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 In this situation I think the likelihood of healing this is low. Therefore I have recommended a ray amputation and with the patient's agreement I have referred him to Dr. Doran Durand. The other issue is that his compliance with hyperbarics has been minimal because of his work schedule and given his underlying decision I am going to stop this today READMISSION 10/24/2019 MRI 09/29/2019 left foot IMPRESSION: 1. Apparent skin ulceration inferior and lateral to the 5th metatarsal base with underlying heterogeneous T2 signal and enhancement in the subcutaneous fat. Small peripherally enhancing fluid collections along the plantar and lateral aspects of the 5th metatarsal base suspicious for abscesses. 2. Interval amputation through the mid 5th metatarsal with nonspecific low-level marrow edema and enhancement. Given the proximity to the adjacent soft tissue inflammatory changes, osteomyelitis cannot be excluded. 3. The additional bones appear unremarkable. MRI 09/29/2019 right foot IMPRESSION: 1. Soft tissue ulceration lateral to the 5th MTP joint. There is low-level T2 hyperintensity within the 4th and 5th metatarsal heads and adjacent proximal phalanges without abnormal T1 signal or cortical destruction. These findings are nonspecific and could be seen with early marrow edema, hyperemia or early osteomyelitis. No evidence of septic joint. 2. Mild tenosynovitis and synovial enhancement associated with  the extensor digitorum tendons at the level of the midfoot. 3. Diffuse low-level muscular T2 hyperintensity and enhancement, most consistent with diabetic myopathy. LEFT FOOT BONE Methicillin resistant staphylococcus aureus Staphylococcus lugdunensis MIC MIC CIPROFLOXACIN >=8 RESISTANT Resistant <=0.5 SENSI... Sensitive CLINDAMYCIN <=0.25 SENS... Sensitive >=8 RESISTANT Resistant ERYTHROMYCIN >=8 RESISTANT Resistant >=8 RESISTANT Resistant GENTAMICIN <=0.5 SENSI... Sensitive <=0.5 SENSI... Sensitive Inducible Clindamycin NEGATIVE Sensitive NEGATIVE Sensitive OXACILLIN >=4 RESISTANT Resistant 2 SENSITIVE Sensitive RIFAMPIN <=0.5 SENSI... Sensitive <=0.5 SENSI... Sensitive TETRACYCLINE <=1 SENSITIVE Sensitive <=1 SENSITIVE Sensitive TRIMETH/SULFA <=10 SENSIT Sensitive <=10 SENSIT Sensitive ... Marland Kitchen.. VANCOMYCIN 1 SENSITIVE Sensitive <=0.5 SENSI.Marland KitchenMarland Kitchen  Sensitive Right foot bone . Component 3 wk ago Specimen Description BONE Special Requests RIGHT 4 METATARSAL SAMPLE B Gram Stain NO WBC SEEN NO ORGANISMS SEEN Culture RARE METHICILLIN RESISTANT STAPHYLOCOCCUS AUREUS NO ANAEROBES ISOLATED Performed at [facility], [place] [address], [place], [place] [address] Report Status 10/08/2019 FINAL Organism ID, Bacteria METHICILLIN RESISTANT STAPHYLOCOCCUS AUREUS Resulting Agency [facility] Susceptibility Methicillin resistant staphylococcus aureus MIC CIPROFLOXACIN >=8 RESISTANT Resistant CLINDAMYCIN <=0.25 SENS... Sensitive ERYTHROMYCIN >=8 RESISTANT Resistant GENTAMICIN <=0.5 SENSI... Sensitive Inducible Clindamycin NEGATIVE Sensitive OXACILLIN >=4 RESISTANT Resistant RIFAMPIN <=0.5 SENSI... Sensitive TETRACYCLINE <=1 SENSITIVE Sensitive TRIMETH/SULFA <=10 SENSIT Sensitive ... VANCOMYCIN 1 SENSITIVE Sensitive This is a patient we had in clinic earlier this year with a wound over his left fifth metatarsal head. 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 Doran Durand on 05/16/2019. He seems to have developed problems with wounds on his bilateral feet in June 2021 although he may have had problems earlier than this. He was in an urgent care with a right foot ulcer on 09/26/2019 and given a course of doxycycline. This was apparently after having trouble getting into see orthopedics. He was seen by podiatry on 09/28/2019 noted to have bilateral lower extremity ulcers including the left lateral fifth metatarsal base and the right subfifth met head. It was noted that had purulent drainage at that time. He required hospitalization from 6/20 through 7/2. This was because of worsening right foot wounds. He underwent bilateral operative incision and drainage and bone biopsies bilaterally. Culture results are listed above. He has been referred back to clinic by Dr. Jacqualyn Posey of podiatry. He is also followed by Dr. Megan Salon who saw him yesterday. He was discharged from hospital on Zyvox Flagyl and Levaquin and yesterday changed to doxycycline Flagyl and Levaquin. His inflammatory markers on 6/26 showed a sedimentation rate of 129 and a C-reactive protein of 5. This is improved to 14 and 1.3 respectively. This would indicate improvement. ABIs in our clinic today were 1.23 on the right and 1.20 on the left 11/01/2019 on evaluation today patient appears to be doing fairly well in regard to the wounds on his feet at this point. Fortunately there is no signs of active infection at this time. No fevers, chills, nausea, vomiting, or diarrhea. He currently is seeing infectious disease and still under their care at this point. Subsequently he also has both wounds which she has not been using collagen on as he did not receive that in  his packaging he did not call us and let us know that. Apparently that just was missed on the order. Nonetheless we will get that straightened out today. 8/9-Patient returns for bilateral foot wounds, using Prisma with hydrogel moistened dressings, and the wounds appear stable. Patient using surgical shoes, avoiding much pressure or weightbearing as much as possible 8/16; patient has bilateral foot wounds. 1 on the right lateral foot proximally the other is on the left mid lateral foot. Both required debridement of callus and thick skin around the wounds. We have been using silver collagen 8/27; patient has bilateral lateral foot wounds. The area on the left substantially surrounded by callus and dry skin. This was removed from the wound edge. The underlying wound is small. The area on  the right measured somewhat smaller today. 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 Unfortunately I did not update his antibiotics during today's visit. 9/10 I reviewed Dr. Hale Bogus last notes he felt he had completed antibiotics his inflammatory markers were reasonably well controlled. He has a small wound on the lateral left foot and a tiny area on the right which is just above closed. He is using Hydrofera Blue with border foam he has bilateral surgical shoes 9/24; 2 week f/u. doing well. right foot is closed. left foot still undermined. 10/14; right foot remains closed at the fifth met head. The area over the base of the left fifth metatarsal has a small open area but considerable undermining towards the plantar foot. Thick callus skin around this suggests an adequate pressure relief. We have talked about this. He says he is going to go back into his cam boot. I suggested a total contact cast he did not seem enamored with this suggestion 10/26; left foot base of the fifth metatarsal. Same condition as last time. He has skin over the area with an  open wound however the skin is not adherent. He went to see Dr. Earleen Newport who did an x-ray and culture of his foot I have not reviewed the x-ray but the patient was not told anything. He is on doxycycline 11/11; since the patient was last here he was in the emergency room on 10/30 he was concerned about swelling in the left foot. They did not do any cultures or x-rays. They changed his antibiotics to cephalexin. Previous culture showed group B strep. The cephalexin is appropriate as doxycycline has less than predictable coverage. Arrives in clinic today with swelling over this area under the wound. He also has a new wound on the right fifth metatarsal head 11/18; the patient has a difficult wound on the lateral aspect of the left fifth metatarsal head. The wound was almost ballotable last week I opened it slightly expecting to see purulence however there was just bleeding. I cultured this this was negative. X-ray unchanged. We are trying to get an MRI but I am not sure were going to be able to get this through his insurance. He also has an area on the right lateral fifth metatarsal head this looks healthier 12/3; the patient finally got our MRI. Surprisingly this did not show osteomyelitis. I did show the soft tissue ulceration at the lateral plantar aspect of the fifth metatarsal base with a tiny residual 6 mm abscess overlying the superficial fascia I have tried to culture this area I have not been able to get this to grow anything. Nevertheless the protruding tissue looks aggravated. I suspect we should try to treat the underlying "abscess with broad-spectrum antibiotics. I am going to start him on Levaquin and Flagyl. He has much less edema in his legs and I am going to continue to wrap his legs and see him weekly 12/10. I started Levaquin and Flagyl on him last week. He just picked up the Flagyl apparently there was some delay. The worry is the wound on the left fifth metatarsal base which is  substantial and worsening. His foot looks like he inverts at the ankle making this a weightbearing surface. Certainly no improvement in fact I think the measurements of this are somewhat worse. We have been using 12/17; he apparently just got the Levaquin yesterday this is 2 weeks after the fact. He has completed the Flagyl. The  area over the left fifth metatarsal base still has protruding granulation tissue although it does not look quite as bad as it did some weeks ago. He has severe bilateral lymphedema although we have not been treating him for wounds on his legs this is definitely going to require compression. There was so much edema in the left I did not wish to put him in a total contact cast today. I am going to increase his compression from 3-4 layer. The area on the right lateral fifth met head actually look quite good and superficial. 12/23; patient arrived with callus on the right fifth met head and the substantial hyper granulated callused wound on the base of his fifth metatarsal. He says he is completing his Levaquin in 2 days but I do not think that adds up with what I gave him but I will have to double check this. We are using Hydrofera Blue on both areas. My plan is to put the left leg in a cast the week after New Year's 04/06/2020; patient's wounds about the same. Right lateral fifth metatarsal head and left lateral foot over the base of the fifth metatarsal. There is undermining on the left lateral foot which I removed before application of total contact cast continuing with Hydrofera Blue new. Patient tells me he was seen by endocrinology today lab work was done [Dr. Kerr]. Also wondering whether he was referred to cardiology. I went over some lab work from previously does not have chronic renal failure certainly not nephrotic range proteinuria he does have very poorly controlled diabetes but this is not his most updated lab work. Hemoglobin A1c has been over 11 1/10; the patient  had a considerable amount of leakage towards mid part of his left foot with macerated skin however the wound surface looks better the area on the right lateral fifth met head is better as well. I am going to change the dressing on the left foot under the total contact cast to silver alginate, continue with Hydrofera Blue on the right. 1/20; patient was in the total contact cast for 10 days. Considerable amount of drainage although the skin around the wound does not look too bad on the left foot. The area on the right fifth metatarsal head is closed. Our nursing staff reports large amount of drainage out of the left lateral foot wound 1/25; continues with copious amounts of drainage described by our intake staff. PCR culture I did last week showed E. coli and Enterococcus faecalis and low quantities. Multiple resistance genes documented including extended spectrum beta lactamase, MRSA, MRSE, quinolone, tetracycline. The wound is not quite as good this week as it was 5 days ago but about the same size 2/3; continues with copious amounts of malodorous drainage per our intake nurse. The PCR culture I did 2 weeks ago showed E. coli and low quantities of Enterococcus. There were multiple resistance genes detected. I put Neosporin on him last week although this does not seem to have helped. The wound is slightly deeper today. Offloading continues to be an issue here although with the amount of drainage she has a total contact cast is just not going to work 2/10; moderate amount of drainage. Patient reports he cannot get his stocking on over the dressing. I told him we have to do that the nurse gave him suggestions on how to make this work. The wound is on the bottom and lateral part of his left foot. Is cultured predominantly grew low amounts of Enterococcus,  E. coli and anaerobes. There were multiple resistance genes detected including extended spectrum beta lactamase, quinolone, tetracycline. I could not  think of an easy oral combination to address this so for now I am going to do topical antibiotics provided by [facility] I think the main agents here are vancomycin and an aminoglycoside. We have to be able to give him access to the wounds to get the topical antibiotic on 2/17; moderate amount of drainage this is unchanged. He has his Keystone topical antibiotic against the deep tissue culture organisms. He has been using this and changing the dressing daily. Silver alginate on the wound surface. 2/24; using Keystone antibiotic with silver alginate on the top. He had too much drainage for a total contact cast at one point although I think that is improving and I think in the next week or 2 it might be possible to replace a total contact cast I did not do this today. In general the wound surface looks healthy however he continues to have thick rims of skin and subcutaneous tissue around the wide area of the circumference which I debrided 06/04/2020 upon evaluation today patient appears to be doing well in regard to his wound. I do feel like he is showing signs of improvement. There is little bit of callus and dead tissue around the edges of the wound as well as what appears to be a little bit of a sinus tract that is off to the side laterally I would perform debridement to clear that away today. 3/17; left lateral foot. The wound looks about the same as I remember. Not much depth surface looks healthy. No evidence of infection 3/25; left lateral foot. Wound surface looks about the same. Separating epithelium from the circumference. There really is no evidence of infection here however not making progress by my view 3/29; left lateral foot. Surface of the wound again looks reasonably healthy still thick skin and subcutaneous tissue around the wound margins. There is no evidence of infection. One of the concerns being brought up by the nurses has again the amount of drainage vis--vis continued use of a  total contact cast 4/5; left lateral foot at roughly the base of the fifth metatarsal. Nice healthy looking granulated tissue with rims of epithelialization. The overall wound measurements are not any better but the tissue looks healthy. The only concern is the amount of drainage although he has no surrounding maceration with what we have been doing recently to absorb fluid and protect his skin. He also has lymphedema. 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 He states that the kerlix/coban under the TCC rolled down and could not get it back up. He has been using an offloading scooter and has somehow hurt his right foot using this device. This happened last week. He states that the side of his right foot developed a blister and opened. The top of his foot also has a few small open wounds he thinks is due to his socks rubbing in his shoes. He has not been using any dressings to the wound. He denies purulent drainage, fever/chills or erythema to the wounds. 4/22; patient presents for 1 week follow-up. He developed new wounds to the right foot that were evaluated at  last clinic visit. He continues to have a total contact cast to the left leg and he reports no issues. He has been using silver collagen to the right foot wounds with no issues. He denies purulent drainage, fever/chills or erythema to the right foot wounds. He has no complaints today 4/25; patient presents for 1 week follow-up. He has a total contact cast of the left leg and reports no issues. He has been using silver alginate to the right foot wound. He denies purulent drainage, fever/chills or erythema to the right foot wounds. 5/2 patient presents for 1 week follow-up. T contact cast on the  left. The wound which is on the base of the plantar foot at the base of the fifth metatarsal otal actually looks quite good and dimensions continue to gradually contract. HOWEVER the area on the right lateral fifth metatarsal head is much larger than what I remember from 2 weeks ago. Once more is he has significant levels of hypergranulation. Noteworthy that he had this same hyper granulated response on his wound on the left foot at one point in time. So much so that he I thought there was an underlying fluid collection. Based on this I think this just needs debridement. Objective Constitutional Sitting or standing Blood Pressure is within target range for patient.. Pulse regular and within target range for patient.Marland Kitchen Respirations regular, non-labored and within target range.. Temperature is normal and within the target range for the patient.Marland Kitchen Appears in no distress. Vitals Time Taken: 12:41 PM, Height: 77 in, Weight: 280 lbs, BMI: 33.2, Temperature: 97.8 F, Pulse: 105 bpm, Respiratory Rate: 18 breaths/min, Blood Pressure: 146/90 mmHg, Capillary Blood Glucose: 180 mg/dl. General Notes: Wound exam oo Left plantar foot at the level of the base of the fifth metatarsal. This actually looks quite good. There is some depth but the tissue looks good. No debridement is required. I am hopeful to have this fill into a surface wound oo The right foot wound on the base of the fifth metatarsal and once again has an extremely hyper granulated response. I used aggressive silver nitrate on this but this may require a full mechanical debridement Integumentary (Hair, Skin) Wound #3 status is Open. Original cause of wound was Trauma. The date acquired was: 10/02/2019. The wound has been in treatment 40 weeks. The wound is located on the Left,Lateral Foot. The wound measures 2.5cm length x 2.1cm width x 0.5cm depth; 4.123cm^2 area and 2.062cm^3 volume. There is Fat Layer (Subcutaneous Tissue) exposed. There is no  tunneling or undermining noted. There is a medium amount of serosanguineous drainage noted. The wound margin is well defined and not attached to the wound base. There is large (67-100%) red granulation within the wound bed. There is no necrotic tissue within the wound bed. General Notes: Callused periwound Wound #5 status is Open. Original cause of wound was Gradually Appeared. The date acquired was: 07/07/2020. The wound has been in treatment 2 weeks. The wound is located on the Right,Lateral Metatarsal head fifth. The wound measures 2.9cm length x 3.6cm width x 0.5cm depth; 8.2cm^2 area and 4.1cm^3 volume. There is Fat Layer (Subcutaneous Tissue) exposed. There is no undermining noted, however, there is tunneling at 4:00 with a maximum distance of 1.5cm. There is a medium amount of serosanguineous drainage noted. The wound margin is well defined and not attached to the wound base. There is large (67- 100%) red, pink granulation within the wound bed. There is a small (1-33%) amount of necrotic tissue within the  wound bed including Adherent Slough. 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 There was a T Contact Cast otal Procedure by [name], MD. Post procedure Diagnosis Wound #3: Same as Pre-Procedure Wound #5 Pre-procedure diagnosis of Wound #5 is a Diabetic Wound/Ulcer of the Lower Extremity located on the Right,Lateral Metatarsal head fifth . An Chemical Cauterization procedure was performed by [name], MD. Post procedure Diagnosis Wound #5: Same as Pre-Procedure Notes: silver nitrate Plan Follow-up Appointments: Return  Appointment in 1 week. - Dr. Dellia Nims Bathing/ Shower/ Hygiene: May shower with protection but do not get wound dressing(s) wet. - use a cast protector to left leg. May shower and wash wound with soap and water. - with dressing changes only to right foot. Edema Control - Lymphedema / SCD / Other: Elevate legs to the level of the heart or above for 30 minutes daily and/or when sitting, a frequency of: - throughout the day Avoid standing for long periods of time. Exercise regularly Moisturize legs daily. - right leg every night before bed. Compression stocking or Garment 20-30 mm/Hg pressure to: - Apply to right leg in the morning and remove at night. Off-Loading: T Contact Cast to Left Lower Extremity otal Open toe surgical shoe to: - right foot WOUND #3: - Foot Wound Laterality: Left, Lateral Cleanser: Soap and Water 1 x Per Week/30 Days Discharge Instructions: May shower and wash wound with dial antibacterial soap and water prior to dressing change. Peri-Wound Care: Zinc Oxide Ointment 30g tube 1 x Per Week/30 Days Discharge Instructions: Apply Zinc Oxide to periwound with each dressing change as needed. Prim Dressing: Hydrofera Blue Classic Foam, 2x2 in 1 x Per Week/30 Days ary Discharge Instructions: Moisten with saline prior to applying to wound bed Secondary Dressing: Woven Gauze Sponge, Non-Sterile 4x4 in 1 x Per Week/30 Days Discharge Instructions: Apply over primary dressing as directed. Secondary Dressing: ABD Pad, 8x10 1 x Per Week/30 Days Discharge Instructions: T down before applying kerlix. Apply over primary dressing as directed. ape Secondary Dressing: Zetuvit Plus 4x8 in 1 x Per Week/30 Days Discharge Instructions: Apply over primary dressing as directed. Secured With: [employer], 4.5x3.1 (in/yd) 1 x Per Week/30 Days Discharge Instructions: Secure with Kerlix as directed. WOUND #5: - Metatarsal head fifth Wound Laterality: Right, Lateral Cleanser: Soap and Water  Every Other Day/30 Days Discharge Instructions: May shower and wash wound with dial antibacterial soap and water prior to dressing change. Prim Dressing: Hydrofera Blue Classic Foam, 4x4 in Every Other Day/30 Days ary Discharge Instructions: Moisten with saline prior to applying to wound bed Secondary Dressing: ABD Pad, 8x10 Every Other Day/30 Days Discharge Instructions: Apply over primary dressing as directed. Secondary Dressing: Optifoam Non-Adhesive Dressing, 4x4 in Every Other Day/30 Days Discharge Instructions: Apply over primary dressing, cut to make foam donut to offload Secured With: [employer], 4.5x3.1 (in/yd) Every Other Day/30 Days Discharge Instructions: Secure with Kerlix as directed. Secured With: 60M Medipore H Soft Cloth Surgical T 4 x 2 (in/yd) Every Other Day/30 Days ape Discharge Instructions: Secure dressing with tape as directed. 1. Changing both dressings to [facility]. Hydrofera Blue is new for the wound on the right 2. He  may require a mechanical debridement of the hypergranulation on the right 3. I am hopeful that we are making excellent progress on the left. Smaller but still with some depth Electronic Signature(s) Signed: 08/03/2020 5:25:01 PM By: [name] MD Entered By: [name] on 08/03/2020 49:61:16 -------------------------------------------------------------------------------- Total Contact Cast Details Patient Name: Date of Service: [name], [name] 08/03/2020 12:30 PM Medical Record Number: [id number] Patient Account Number: [id number] Date of Birth/Sex: Treating RN: [birth year] (35 y.o. [name] Primary Care Provider: [name] Other Clinician: Referring Provider: Treating Provider/Extender: [name] in Treatment: 61 T Contact Cast Applied for Wound Assessment: otal Wound #3 Left,Lateral Foot Performed By: Physician [name], MD Post Procedure Diagnosis Same as  Pre-procedure Electronic Signature(s) Signed: 08/03/2020 5:25:01 PM By: [name] MD Entered By: [name] on 08/03/2020 13:24:27 -------------------------------------------------------------------------------- SuperBill Details Patient Name: Date of Service: [name], [name] 08/03/2020 Medical Record Number: [id number] Patient Account Number: [id number] Date of Birth/Sex: Treating RN: 1986-02-02 (35 y.o. [name] Primary Care Provider: [name] Other Clinician: Referring Provider: Treating Provider/Extender: [name] in Treatment: 40 Diagnosis Coding ICD-10 Codes Code Description E11.621 Type 2 diabetes mellitus with foot ulcer L97.528 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ity L97.518 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ity Facility Procedures CPT4 Code: [id number] I Description: [phone] - APPLY TOTAL CONTACT LEG CAST ICD-10 Diagnosis Description CD-10 Diagnosis Description L97.528 Non-pressure chronic ulcer of other part of left foot with other specified se Modifier: verity Quantity: 1 Physician Procedures : CPT4 Code Description Modifier [id number] - WC PHYS APPLY TOTAL CONTACT CAST ICD-10 Diagnosis Description L97.528 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ity Quantity: 1 Electronic Signature(s) Signed: 08/03/2020 5:25:01 PM By: [name] MD Entered By: [name] on 08/03/2020 13:29:47

## 2020-08-07 NOTE — Progress Notes (Signed)
[place], [name] ([id number]) Visit Report for 07/27/2020 Arrival Information Details Patient Name: Date of Service: [name] 07/27/2020 9:30 A M Medical Record Number: [id number] Patient Account Number: [id number] Date of Birth/Sex: Treating RN: [birth year] (36 y.o. [name], [name] Primary Care Ridwan Bondy: [name] Other Clinician: Referring Nour Rodrigues: Treating Malcolm Hetz/Extender: [name] in Treatment: 39 Visit Information History Since Last Visit Added or deleted any medications: No Patient Arrived: Ambulatory Any new allergies or adverse reactions: No Arrival Time: 09:58 Had a fall or experienced change in No Accompanied By: self activities of daily living that may affect Transfer Assistance: None risk of falls: Patient Identification Verified: Yes Signs or symptoms of abuse/neglect since last visito No Secondary Verification Process Completed: Yes Hospitalized since last visit: No Patient Requires Transmission-Based Precautions: No Implantable device outside of the clinic excluding No Patient Has Alerts: No cellular tissue based products placed in the center since last visit: Has Dressing in Place as Prescribed: Yes Pain Present Now: No Electronic Signature(s) Signed: 07/28/2020 2:09:33 PM By: Sandre Kitty Entered By: Sandre Kitty on 07/27/2020 09:58:39 -------------------------------------------------------------------------------- Encounter Discharge Information Details Patient Name: Date of Service: [name], [name] 07/27/2020 9:30 A M Medical Record Number: [id number] Patient Account Number: [id number] Date of Birth/Sex: Treating RN: July 12, 1985 (35 y.o. [name] Primary Care Aron Needles: [name] Other Clinician: Referring Demiah Gullickson: Treating Oluwaseyi Raffel/Extender: [name] in Treatment: 78 Encounter Discharge Information Items Post Procedure Vitals Discharge Condition:  Stable Temperature (F): 98.4 Ambulatory Status: Ambulatory Pulse (bpm): 92 Discharge Destination: Home Respiratory Rate (breaths/min): 18 Transportation: Private Auto Blood Pressure (mmHg): 131/84 Schedule Follow-up Appointment: Yes Clinical Summary of Care: Provided on 07/27/2020 Form Type Recipient Paper Patient Patient Electronic Signature(s) Signed: 07/27/2020 11:21:35 AM By: [name] Previous Signature: 07/27/2020 11:05:24 AM Version By: [name] Entered By: [name] on 07/27/2020 11:21:34 -------------------------------------------------------------------------------- Lower Extremity Assessment Details Patient Name: Date of Service: [name], [name] 07/27/2020 9:30 A M Medical Record Number: [id number] Patient Account Number: [id number] Date of Birth/Sex: Treating RN: February 07, 1986 (35 y.o. [name], [name] Primary Care Yahshua Thibault: [name] Other Clinician: Referring Ruwayda Curet: Treating Farris Blash/Extender: [name] in Treatment: 39 Edema Assessment Assessed: [name]: Yes] [name]: Yes] Edema: [Left: Yes] [Right: Yes] Calf Left: Right: Point of Measurement: 48 cm From Medial Instep 53.2 cm 48.5 cm Ankle Left: Right: Point of Measurement: 10 cm From Medial Instep 31.6 cm 31.5 cm Vascular Assessment Pulses: Dorsalis Pedis Palpable: [Left:Yes] [Right:Yes] Posterior Tibial Palpable: [Left:Yes] [Right:Yes] Electronic Signature(s) Signed: 08/07/2020 3:14:58 PM By: Rhae Hammock RN Entered By: Rhae Hammock on 07/27/2020 10:16:36 -------------------------------------------------------------------------------- Multi Wound Chart Details Patient Name: Date of Service: [name], [name] 07/27/2020 9:30 A M Medical Record Number: [id number] Patient Account Number: [id number] Date of Birth/Sex: Treating RN: [birth year] (35 y.o. [name] Primary Care Kanye Depree: [name] Other Clinician: Referring  Nicolette Gieske: Treating Samentha Perham/Extender: [name] in Treatment: 39 Vital Signs Height(in): 77 Capillary Blood Glucose(mg/dl): 130 Weight(lbs): 280 Pulse(bpm): 92 Body Mass Index(BMI): 33 Blood Pressure(mmHg): 131/84 Temperature(F): 98.4 Respiratory Rate(breaths/min): 18 Photos: [3:No Photos Left, Lateral Foot] [5:No Photos Right, Lateral Metatarsal head fifth] [6:No Photos Right, Anterior Ankle] Wound Location: [3:Trauma] [5:Gradually Appeared] [6:Gradually Appeared] Wounding Event: [3:Diabetic Wound/Ulcer of the Lower] [5:Diabetic Wound/Ulcer of the Lower] [6:Diabetic Wound/Ulcer of the Lower] Primary Etiology: [3:Extremity Type II Diabetes] [5:Extremity Type II Diabetes] [6:Extremity N/A] Comorbid History: [3:10/02/2019] [5:07/07/2020] [6:06/02/2020] Date Acquired: [3:39] [5:1] [6:1] Weeks of Treatment: [  3:Open] [5:Open] [6:Healed - Epithelialized] Wound Status: [3:2.5x2.7x0.1] [5:3x3.5x0.5] [6:0x0x0] Measurements L x W x [name] [3:5.301] [5:8.247] [6:0] A (cm) : rea [3:0.53] [5:4.123] [6:0] Volume (cm) : [3:-221.50%] [5:6.20%] [6:100.00%] % Reduction in A rea: [3:-221.20%] [5:6.30%] [6:100.00%] % Reduction in Volume: [3:Grade 2] [5:Grade 2] [6:Grade 1] Classification: [3:Medium] [5:Medium] [6:N/A] Exudate A mount: [3:Serosanguineous] [5:Serosanguineous] [6:N/A] Exudate Type: [3:red, brown] [5:red, brown] [6:N/A] Exudate Color: [3:Flat and Intact] [5:Well defined, not attached] [6:N/A] Wound Margin: [3:Large (67-100%)] [5:Large (67-100%)] [6:N/A] Granulation A mount: [3:Red] [5:Red, Pink] [6:N/A] Granulation Quality: [3:None Present (0%)] [5:Small (1-33%)] [6:N/A] Necrotic A mount: [3:Fat Layer (Subcutaneous Tissue): Yes Fat Layer (Subcutaneous Tissue): Yes N/A] Exposed Structures: [3:Fascia: No Tendon: No Muscle: No Joint: No Bone: No Small (1-33%)] [5:Fascia: No Tendon: No Muscle: No Joint: No Bone: No Small (1-33%)] [6:N/A] Epithelialization:  [3:Debridement - Excisional] [5:Debridement - Excisional] [6:N/A] Debridement: Pre-procedure Verification/Time Out 11:05 [5:11:05] [6:N/A] Taken: [3:Other] [5:Other] [6:N/A] Pain Control: [3:Callus, Subcutaneous, Slough] [5:Callus, Subcutaneous] [6:N/A] Tissue Debrided: [3:Skin/Subcutaneous Tissue] [5:Skin/Subcutaneous Tissue] [6:N/A] Level: [3:6.75] [5:10.5] [6:N/A] Debridement A (sq cm): [3:rea Curette] [5:Curette] [6:N/A] Instrument: [3:Minimum] [5:Minimum] [6:N/A] Bleeding: [3:Pressure] [5:Pressure] [6:N/A] Hemostasis A chieved: [3:0] [5:0] [6:N/A] Procedural Pain: [3:0] [5:0] [6:N/A] Post Procedural Pain: [3:Procedure was tolerated well] [5:Procedure was tolerated well] [6:N/A] Debridement Treatment Response: [3:2.5x2.7x0.1] [5:3x3.5x0.5] [6:N/A] Post Debridement Measurements L x W x [name] [3:0.53] [5:4.123] [6:N/A] Post Debridement Volume: (cm) [3:Debridement] [5:Debridement] [6:N/A] Procedures Performed: [3:T Contact Cast otal] Treatment Notes Wound #3 (Foot) Wound Laterality: Left, Lateral Cleanser Soap and Water Discharge Instruction: May shower and wash wound with dial antibacterial soap and water prior to dressing change. Peri-Wound Care Zinc Oxide Ointment 30g tube Discharge Instruction: Apply Zinc Oxide to periwound with each dressing change as needed. Topical Primary Dressing Hydrofera Blue Classic Foam, 2x2 in Discharge Instruction: Moisten with saline prior to applying to wound bed Secondary Dressing Woven Gauze Sponge, Non-Sterile 4x4 in Discharge Instruction: Apply over primary dressing as directed. ABD Pad, 8x10 Discharge Instruction: T down before applying kerlix. Apply over primary dressing as directed. ape Zetuvit Plus 4x8 in Discharge Instruction: Apply over primary dressing as directed. Secured With [employer], 4.5x3.1 (in/yd) Discharge Instruction: Secure with Kerlix as directed. Compression Wrap Compression Stockings Add-Ons Wound #5  (Metatarsal head fifth) Wound Laterality: Right, Lateral Cleanser Soap and Water Discharge Instruction: May shower and wash wound with dial antibacterial soap and water prior to dressing change. Peri-Wound Care Topical Primary Dressing KerraCel Ag Gelling Fiber Dressing, 4x5 in (silver alginate) Discharge Instruction: Apply silver alginate to wound bed, tuck into hole in middle of wound bed Secondary Dressing ABD Pad, 8x10 Discharge Instruction: Apply over primary dressing as directed. Optifoam Non-Adhesive Dressing, 4x4 in Discharge Instruction: Apply over primary dressing, cut to make foam donut to offload Secured With [employer] Sterile, 4.5x3.1 (in/yd) Discharge Instruction: Secure with Kerlix as directed. 35M Medipore H Soft Cloth Surgical T 4 x 2 (in/yd) ape Discharge Instruction: Secure dressing with tape as directed. Compression Wrap Compression Stockings Add-Ons Wound #6 (Ankle) Wound Laterality: Right, Anterior Cleanser Peri-Wound Care Topical Primary Dressing Secondary Dressing Secured With Compression Wrap Compression Stockings Add-Ons Electronic Signature(s) Signed: 07/27/2020 12:43:16 PM By: [name] DO Signed: 07/27/2020 4:57:45 PM By: Baruch Gouty RN, BSN Entered By: [name] on 07/27/2020 12:35:25 -------------------------------------------------------------------------------- Multi-Disciplinary Care Plan Details Patient Name: Date of Service: [name], [name] 07/27/2020 9:30 A M Medical Record Number: [id number] Patient Account Number: [id number] Date of Birth/Sex: Treating RN: November 17, 1985 (35 y.o. [name],  [name] Primary Care Richardson Dubree: [name] Other Clinician: Referring Michie Molnar: Treating Dorene Bruni/Extender: [name] in Treatment: 39 Multidisciplinary Care Plan reviewed with physician Active Inactive Nutrition Nursing Diagnoses: Imbalanced nutrition Potential for alteratiion in  Nutrition/Potential for imbalanced nutrition Goals: Patient/caregiver agrees to and verbalizes understanding of need to use nutritional supplements and/or vitamins as prescribed Date Initiated: 10/24/2019 Date Inactivated: 04/06/2020 Target Resolution Date: 04/03/2020 Goal Status: Met Patient/caregiver will maintain therapeutic glucose control Date Initiated: 10/24/2019 Target Resolution Date: 08/18/2020 Goal Status: Active Interventions: Assess HgA1c results as ordered upon admission and as needed Assess patient nutrition upon admission and as needed per policy Provide education on elevated blood sugars and impact on wound healing Provide education on nutrition Treatment Activities: Education provided on Nutrition : 07/21/2020 Notes: Wound/Skin Impairment Nursing Diagnoses: Impaired tissue integrity Knowledge deficit related to ulceration/compromised skin integrity Goals: Patient/caregiver will verbalize understanding of skin care regimen Date Initiated: 10/24/2019 Target Resolution Date: 08/24/2020 Goal Status: Active Ulcer/skin breakdown will have a volume reduction of 30% by week 4 Date Initiated: 10/24/2019 Date Inactivated: 01/16/2020 Target Resolution Date: 01/10/2020 Unmet Reason: no change in Goal Status: Unmet measurements. Interventions: Assess patient/caregiver ability to obtain necessary supplies Assess patient/caregiver ability to perform ulcer/skin care regimen upon admission and as needed Assess ulceration(s) every visit Provide education on ulcer and skin care Notes: Electronic Signature(s) Signed: 07/27/2020 4:57:45 PM By: Baruch Gouty RN, BSN Entered By: Baruch Gouty on 07/27/2020 11:03:53 -------------------------------------------------------------------------------- Pain Assessment Details Patient Name: Date of Service: [name], [name] 07/27/2020 9:30 A M Medical Record Number: [id number] Patient Account Number: [id number] Date of  Birth/Sex: Treating RN: 1985-11-10 (35 y.o. [name] Primary Care Mayling Aber: [name] Other Clinician: Referring Wilmina [name]: Treating Mikhai Bienvenue/Extender: [name] in Treatment: 39 Active Problems Location of Pain Severity and Description of Pain Patient Has Paino No Site Locations Pain Management and Medication Current Pain Management: Electronic Signature(s) Signed: 07/27/2020 4:57:45 PM By: Baruch Gouty RN, BSN Signed: 07/28/2020 2:09:33 PM By: Sandre Kitty Entered By: Sandre Kitty on 07/27/2020 09:59:07 -------------------------------------------------------------------------------- Patient/Caregiver Education Details Patient Name: Date of Service: [name], [name] 4/25/2022andnbsp9:30 A M Medical Record Number: [id number] Patient Account Number: [id number] Date of Birth/Gender: Treating RN: 1985/04/20 (35 y.o. [name] Primary Care Physician: [name] Other Clinician: Referring Physician: Treating Physician/Extender: [name] in Treatment: 61 Education Assessment Education Provided To: Patient Education Topics Provided Elevated Blood Sugar/ Impact on Healing: Methods: Explain/Verbal Responses: Reinforcements needed, State content correctly Offloading: Methods: Explain/Verbal Responses: Reinforcements needed, State content correctly Wound/Skin Impairment: Methods: Explain/Verbal Responses: Reinforcements needed, State content correctly Electronic Signature(s) Signed: 07/27/2020 4:57:45 PM By: Baruch Gouty RN, BSN Entered By: Baruch Gouty on 07/27/2020 11:04:19 -------------------------------------------------------------------------------- Wound Assessment Details Patient Name: Date of Service: [name], [name] 07/27/2020 9:30 A M Medical Record Number: [id number] Patient Account Number: [id number] Date of Birth/Sex: Treating RN: Jun 19, 1985 (35 y.o. [name], [name] Primary Care Corbyn Wildey: [name] Other Clinician: Referring Abrielle Finck: Treating Saajan Willmon/Extender: [name] in Treatment: 39 Wound Status Wound Number: 3 Primary Etiology: Diabetic Wound/Ulcer of the Lower Extremity Wound Location: Left, Lateral Foot Wound Status: Open Wounding Event: Trauma Comorbid History: Type II Diabetes Date Acquired: 10/02/2019 Weeks Of Treatment: 39 Clustered Wound: No Photos Wound Measurements Length: (cm) 2.5 Width: (cm) 2.7 Depth: (cm) 0.1 Area: (cm) 5.301 Volume: (cm) 0.53 % Reduction in Area: -221.5% % Reduction in Volume: -221.2% Epithelialization: Small (1-33%) Tunneling: No Undermining: No Wound  Description Classification: Grade 2 Wound Margin: Flat and Intact Exudate Amount: Medium Exudate Type: Serosanguineous Exudate Color: red, brown Foul Odor After Cleansing: No Slough/Fibrino No Wound Bed Granulation Amount: Large (67-100%) Exposed Structure Granulation Quality: Red Fascia Exposed: No Necrotic Amount: None Present (0%) Fat Layer (Subcutaneous Tissue) Exposed: Yes Tendon Exposed: No Muscle Exposed: No Joint Exposed: No Bone Exposed: No Electronic Signature(s) Signed: 07/28/2020 2:09:33 PM By: Sandre Kitty Signed: 08/07/2020 3:14:58 PM By: Rhae Hammock RN Entered By: Sandre Kitty on 07/27/2020 14:57:42 -------------------------------------------------------------------------------- Wound Assessment Details Patient Name: Date of Service: [name], [name] 07/27/2020 9:30 A M Medical Record Number: [id number] Patient Account Number: [id number] Date of Birth/Sex: Treating RN: 07/25/1985 (35 y.o. [name], [name] Primary Care Haydan Wedig: [name] Other Clinician: Referring Kacper Cartlidge: Treating Brynden Thune/Extender: [name] in Treatment: 39 Wound Status Wound Number: 5 Primary Etiology: Diabetic Wound/Ulcer of the Lower  Extremity Wound Location: Right, Lateral Metatarsal head fifth Wound Status: Open Wounding Event: Gradually Appeared Comorbid History: Type II Diabetes Date Acquired: 07/07/2020 Weeks Of Treatment: 1 Clustered Wound: No Photos Wound Measurements Length: (cm) 3 Width: (cm) 3.5 Depth: (cm) 0.5 Area: (cm) 8.247 Volume: (cm) 4.123 % Reduction in Area: 6.2% % Reduction in Volume: 6.3% Epithelialization: Small (1-33%) Tunneling: No Undermining: No Wound Description Classification: Grade 2 Wound Margin: Well defined, not attached Exudate Amount: Medium Exudate Type: Serosanguineous Exudate Color: red, brown Foul Odor After Cleansing: No Slough/Fibrino No Wound Bed Granulation Amount: Large (67-100%) Exposed Structure Granulation Quality: Red, Pink Fascia Exposed: No Necrotic Amount: Small (1-33%) Fat Layer (Subcutaneous Tissue) Exposed: Yes Necrotic Quality: Adherent Slough Tendon Exposed: No Muscle Exposed: No Joint Exposed: No Bone Exposed: No Electronic Signature(s) Signed: 07/28/2020 2:09:33 PM By: Sandre Kitty Signed: 08/07/2020 3:14:58 PM By: Rhae Hammock RN Entered By: Sandre Kitty on 07/27/2020 14:56:33 -------------------------------------------------------------------------------- Wound Assessment Details Patient Name: Date of Service: [name], [name] 07/27/2020 9:30 A M Medical Record Number: [id number] Patient Account Number: [id number] Date of Birth/Sex: Treating RN: Oct 13, 1985 (35 y.o. [name], [name] Primary Care Peyten Weare: [name] Other Clinician: Referring Celesta Funderburk: Treating Aarvi Stotts/Extender: [name] in Treatment: 39 Wound Status Wound Number: 6 Primary Etiology: Diabetic Wound/Ulcer of the Lower Extremity Wound Location: Right, Anterior Ankle Wound Status: Healed - Epithelialized Wounding Event: Gradually Appeared Date Acquired: 06/02/2020 Weeks Of Treatment: 1 Clustered Wound: No Wound  Measurements Length: (cm) Width: (cm) Depth: (cm) Area: (cm) Volume: (cm) 0 % Reduction in Area: 100% 0 % Reduction in Volume: 100% 0 0 0 Wound Description Classification: Grade 1 Treatment Notes Wound #6 (Ankle) Wound Laterality: Right, Anterior Cleanser Peri-Wound Care Topical Primary Dressing Secondary Dressing Secured With Compression Wrap Compression Stockings Add-Ons Electronic Signature(s) Signed: 08/07/2020 3:14:58 PM By: Rhae Hammock RN Entered By: Rhae Hammock on 07/27/2020 10:16:48 -------------------------------------------------------------------------------- Vitals Details Patient Name: Date of Service: [name], [name] 07/27/2020 9:30 A M Medical Record Number: [id number] Patient Account Number: [id number] Date of Birth/Sex: Treating RN: 05-07-1985 (35 y.o. [name] Primary Care Jakeb Lamping: [name] Other Clinician: Referring Ferd Horrigan: Treating Aalivia Mcgraw/Extender: [name] in Treatment: 39 Vital Signs Time Taken: 09:58 Temperature (F): 98.4 Height (in): 77 Pulse (bpm): 92 Weight (lbs): 280 Respiratory Rate (breaths/min): 18 Body Mass Index (BMI): 33.2 Blood Pressure (mmHg): 131/84 Capillary Blood Glucose (mg/dl): 130 Reference Range: 80 - 120 mg / dl Electronic Signature(s) Signed: 07/28/2020 2:09:33 PM By: Sandre Kitty Entered By: Sandre Kitty on 07/27/2020 09:59:02

## 2020-08-07 NOTE — Progress Notes (Signed)
[place], [name] ([id number]) Visit Report for 07/21/2020 Chief Complaint Document Details Patient Name: Date of Service: [name] 07/21/2020 3:00 PM Medical Record Number: [id number] Patient Account Number: [id number] Date of Birth/Sex: Treating RN: 03-13-1986 (35 y.o. [name], [name] Primary Care Provider: [name] Other Clinician: Referring Provider: Treating Provider/Extender: [name] in Treatment: 55 Information Obtained from: Patient Chief Complaint 01/11/2019; patient is here for review of a rather substantial wound over the left fifth plantar metatarsal head extending into the lateral part of his foot 10/24/2019; patient returns to clinic with wounds on his bilateral feet with underlying osteomyelitis biopsy-proven Electronic Signature(s) Signed: 07/24/2020 12:51:47 PM By: [name] DO Entered By: [name] on 07/24/2020 12:40:36 -------------------------------------------------------------------------------- Debridement Details Patient Name: Date of Service: [name], [name] 07/21/2020 3:00 PM Medical Record Number: [id number] Patient Account Number: [id number] Date of Birth/Sex: Treating RN: 02-22-1986 (35 y.o. [name], [name] Primary Care Provider: [name] Other Clinician: Referring Provider: Treating Provider/Extender: [name] in Treatment: 38 Debridement Performed for Assessment: Wound #5 Right,Lateral Metatarsal head fifth Performed By: Physician [name], DO Debridement Type: Debridement Severity of Tissue Pre Debridement: Fat layer exposed Level of Consciousness (Pre-procedure): Awake and Alert Pre-procedure Verification/Time Out Yes - 16:08 Taken: Start Time: 16:08 Pain Control: Lidocaine T Area Debrided (L x W): otal 1.7 (cm) x 1.5 (cm) = 2.55 (cm) Tissue and other material debrided: Viable, Non-Viable, Callus Level: Non-Viable Tissue Debridement  Description: Selective/Open Wound Instrument: Blade, Forceps Bleeding: Minimum Hemostasis Achieved: Pressure End Time: 16:09 Procedural Pain: 0 Post Procedural Pain: 0 Response to Treatment: Procedure was tolerated well Level of Consciousness (Post- Awake and Alert procedure): Post Debridement Measurements of Total Wound Length: (cm) 2.5 Width: (cm) 3 Depth: (cm) 0.5 Volume: (cm) 2.945 Character of Wound/Ulcer Post Debridement: Improved Severity of Tissue Post Debridement: Fat layer exposed Post Procedure Diagnosis Same as Pre-procedure Electronic Signature(s) Signed: 07/24/2020 12:51:47 PM By: [name] DO Signed: 08/07/2020 3:14:58 PM By: Rhae Hammock RN Entered By: Rhae Hammock on 07/21/2020 16:09:57 -------------------------------------------------------------------------------- Debridement Details Patient Name: Date of Service: [name], [name] 07/21/2020 3:00 PM Medical Record Number: [id number] Patient Account Number: [id number] Date of Birth/Sex: Treating RN: 02/03/1986 (35 y.o. [name], [name] Primary Care Provider: [name] Other Clinician: Referring Provider: Treating Provider/Extender: [name] in Treatment: 38 Debridement Performed for Assessment: Wound #3 Left,Lateral Foot Performed By: Physician [name], DO Debridement Type: Debridement Severity of Tissue Pre Debridement: Fat layer exposed Level of Consciousness (Pre-procedure): Awake and Alert Pre-procedure Verification/Time Out Yes - 16:09 Taken: Start Time: 16:09 Pain Control: Lidocaine T Area Debrided (L x W): otal 2.7 (cm) x 2.7 (cm) = 7.29 (cm) Tissue and other material debrided: Viable, Non-Viable, Callus, Slough, Subcutaneous, Skin: Dermis , Skin: Epidermis, Slough Level: Skin/Subcutaneous Tissue Debridement Description: Excisional Instrument: Blade, Curette, Forceps Bleeding: Minimum Hemostasis Achieved: Silver Nitrate End  Time: 16:10 Procedural Pain: 0 Post Procedural Pain: 0 Response to Treatment: Procedure was tolerated well Level of Consciousness (Post- Awake and Alert procedure): Post Debridement Measurements of Total Wound Length: (cm) 2.7 Width: (cm) 2.7 Depth: (cm) 0.1 Volume: (cm) 0.573 Character of Wound/Ulcer Post Debridement: Improved Severity of Tissue Post Debridement: Fat layer exposed Post Procedure Diagnosis Same as Pre-procedure Electronic Signature(s) Signed: 07/24/2020 12:51:47 PM By: [name] DO Signed: 08/07/2020 3:14:58 PM By: Rhae Hammock RN Entered By: Rhae Hammock on 07/21/2020 16:15:46 -------------------------------------------------------------------------------- HPI Details Patient Name: Date of Service: [name],  [name] 07/21/2020 3:00 PM Medical Record Number: [id number] Patient Account Number: [id number] Date of Birth/Sex: Treating RN: July 19, 1985 (35 y.o. 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 He comes in for review of a wound over the plantar fifth metatarsal head extending into the lateral part of the foot. He was followed for this previously by his podiatrist Dr. Cornelius Moras. As the patient tells his story he went to see podiatry first for a swelling he developed on the lateral part of his fifth metatarsal head in May. He states this was "open" by podiatry and the area closed. He was followed up in June and it was again opened callus removed and it closed promptly. There were plans being made for surgery on the fifth metatarsal head in June however his blood sugar was apparently too high for anesthesia. Apparently the area was debrided and opened again in June and it is  never closed since. Looking over the records from podiatry I am really not able to follow this. It was clear when he was first seen it was before 5/14 at that point he already had a wound. By 5/17 the ulcer was resolved. I do not see anything about a procedure. On 5/28 noted to have pre-ulcerative moderate keratosis. X-ray noted 1/5 contracted toe and tailor's bunion and metatarsal deformity. On a visit date on 09/28/2018 the dorsal part of the left foot it healed and resolved. There was concern about swelling in his lower extremity he was sent to the ER.. As far as I can tell he was seen in the ER on 7/12 with an ulcer on his left foot. A DVT rule out of the left leg was negative. I do not think I have complete records from podiatry but I am not able to verify the procedures this patient states he had. He states after the last procedure the wound has never closed although I am not able to follow this in the records I have from podiatry. He has not had a recent x-ray The patient has been using Neosporin on the wound. He is wearing a Darco shoe. He is still very active up on his foot working and exercising. Past medical history; type 2 diabetes ketosis-prone, leg swelling with a negative DVT study in July. Non-smoker ABI in our clinic was 0.85 on the left 10/16; substantial wound on the plantar left fifth met head extending laterally almost to the dorsal fifth MTP. We have been using silver alginate we gave him a Darco forefoot off loader. An x-ray did not show evidence of osteomyelitis did note soft tissue emphysema which I think was due to gas tracking through an open wound. There is no doubt in my mind he requires an MRI 10/23; MRI not booked until 3 November at the earliest this is largely due to his glucose sensor in the right arm. We have been using silver alginate. There has been an improvement 10/29; I am still not exactly sure when his MRI is booked for. He says it is the third but it is the  10th in epic. This definitely needs to be done. He is running a low-grade fever today but no other symptoms. No real improvement in the 1 02/26/2019 patient presents today for a follow-up visit here in our clinic he  is last been seen in the clinic on October 29. Subsequently we were working on getting MRI to evaluate and see what exactly was going on and where we would need to go from the standpoint of whether or not he had osteomyelitis and again what treatments were going be required. Subsequently the patient ended up being admitted to the hospital on 02/07/2019 and was discharged on 02/14/2019. This is a somewhat interesting admission with a discharge diagnosis of pneumonia due to COVID-19 although he was positive for COVID-19 when tested at the urgent care but negative x2 when he was actually in the hospital. With that being said he did have acute respiratory failure with hypoxia and it was noted he also have a left foot ulceration with osteomyelitis. With that being said he did require oxygen for his pneumonia and I level 4 L. He was placed on antivirals and steroids for the COVID-19. He was also transferred to the [facility] at one point. Nonetheless he did subsequently discharged home and since being home has done much better in that regard. The CT angiogram did not show any pulmonary embolism. With regard to the osteomyelitis the patient was placed on vancomycin and Zosyn while in the hospital but has been changed to Augmentin at discharge. It was also recommended that he follow- up with wound care and podiatry. Podiatry however wanted him to see [name] according to the patient prior to them doing anything further. His hemoglobin A1c was 9.9 as noted in the hospital. Have an MRI of the left foot performed while in the hospital on 02/04/2019. 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 There is an  overlying plantar open wound noted an abscess tracking back along the lateral aspect of the fifth metatarsal shaft. There is otherwise diffuse cellulitis and mild fasciitis without findings of polymyositis. The patient did have recently pneumonia secondary to COVID-19 I looked in the chart through epic and it does appear that the patient may need to have an additional x-ray just to ensure everything is cleared and that he has no airspace disease prior to putting him into the chamber. 03/05/2019; patient was readmitted to the clinic last week. He was hospitalized twice for a viral upper respiratory tract infection from 11/1 through 11/4 and then 11/5 through 11/12 ultimately this turned out to be Covid pneumonitis. Although he was discharged on oxygen he is not using it. He says he feels fine. He has no exercise limitation no cough no sputum. His O2 sat in our clinic today was 100% on room air. 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 He received Vanco and Zosyn in the hospital and then was discharged on 2 weeks of Augmentin. I do not see any relevant cultures. He was supposed to follow-up with infectious disease but I do not see that he has an appointment. 12/8; patient saw Dr. Novella Olive of infectious disease last week. He felt that he had had adequate antibiotic therapy. He did not go to follow-up with Dr. Amalia Hailey of podiatry and I have again talked to him about the pros and cons of this. He does not want to consider a ray amputation of this time. He is aware of the risks of recurrence, migration etc. He started HBO today and tolerated this well. He can complete the Augmentin that  I gave him last week. I have looked over the lab work that Dr. Chana Bode ordered his C-reactive protein was 3.3 and his sedimentation rate was 17. The C-reactive protein is never really been measurably that high in  this patient 12/15; not much change in the wound today however he has undermining along the lateral part of the foot again more extensively than last week. He has some rims of epithelialization. We have been using silver alginate. He is undergoing hyperbarics but did not dive today 12/18; in for his obligatory first total contact cast change. Unfortunately there was pus coming from the undermining area around his fifth metatarsal head. This was cultured but will preclude reapplication of a cast. He is seen in conjunction with HBO 12/24; patient had staph lugdunensis in the wound in the undermining area laterally last time. We put him on doxycycline which should have covered this. The wound looks better today. I am going to give him another week of doxycycline before reattempting the total contact cast 12/31; the patient is completing antibiotics. Hemorrhagic debris in the distal part of the wound with some undermining distally. He also had hyper granulation. Extensive debridement with a #5 curette. The infected area that was on the lateral part of the fifth met head is closed over. I do not think he needs any more antibiotics. Patient was seen prior to HBO. Preparations for a total contact cast were made in the cast will be placed post hyperbarics 04/11/19; once again the patient arrives today without complaint. He had been in a cast all week noted that he had heavy drainage this week. This resulted in large raised areas of macerated tissue around the wound 1/14; wound bed looks better slightly smaller. Hydrofera Blue has been changing himself. He had a heavy drainage last week which caused a lot of maceration around the wound so I took him out of a total contact cast he says the drainage is actually better this week He is seen today in conjunction with HBO 1/21; returns to clinic. He was up in [place] for a day or 2 attending a funeral. He comes back in with the wound larger and with a large area of  exposed bone. He had osteomyelitis and septic arthritis of the fifth left metatarsal head while he was in hospital. He received IV antibiotics in the hospital for a prolonged period of time then 3 weeks of Augmentin. Subsequently I gave him 2 weeks of doxycycline for more superficial wound infection. When I saw this last week the wound was smaller the surface of the wound looks satisfactory. 1/28; patient missed hyperbarics today. Bone biopsy I did last time showed Enterococcus faecalis and Staphylococcus lugdunensis . He has a wide area of exposed bone. We are going to use silver alginate as of today. I had another ethical discussion with the patient. 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 In this situation I think the likelihood of healing this is low. Therefore I have recommended a ray amputation and with the patient's agreement I have referred him to Dr. Doran Durand. The other issue is that his compliance with hyperbarics has been minimal because of his work schedule and given his underlying decision I am going to stop this today READMISSION 10/24/2019 MRI 09/29/2019 left foot IMPRESSION: 1. Apparent skin ulceration inferior and lateral to the 5th metatarsal base with underlying heterogeneous T2 signal and enhancement in the subcutaneous fat. Small peripherally enhancing fluid collections along the plantar and lateral aspects  of the 5th metatarsal base suspicious for abscesses. 2. Interval amputation through the mid 5th metatarsal with nonspecific low-level marrow edema and enhancement. Given the proximity to the adjacent soft tissue inflammatory changes, osteomyelitis cannot be excluded. 3. The additional bones appear unremarkable. MRI 09/29/2019 right foot IMPRESSION: 1. Soft tissue ulceration lateral to the 5th MTP joint. There is low-level T2 hyperintensity within the 4th and 5th metatarsal heads and adjacent proximal phalanges without abnormal T1  signal or cortical destruction. These findings are nonspecific and could be seen with early marrow edema, hyperemia or early osteomyelitis. No evidence of septic joint. 2. Mild tenosynovitis and synovial enhancement associated with the extensor digitorum tendons at the level of the midfoot. 3. Diffuse low-level muscular T2 hyperintensity and enhancement, most consistent with diabetic myopathy. LEFT FOOT BONE Methicillin resistant staphylococcus aureus Staphylococcus lugdunensis MIC MIC CIPROFLOXACIN >=8 RESISTANT Resistant <=0.5 SENSI... Sensitive CLINDAMYCIN <=0.25 SENS... Sensitive >=8 RESISTANT Resistant ERYTHROMYCIN >=8 RESISTANT Resistant >=8 RESISTANT Resistant GENTAMICIN <=0.5 SENSI... Sensitive <=0.5 SENSI... Sensitive Inducible Clindamycin NEGATIVE Sensitive NEGATIVE Sensitive OXACILLIN >=4 RESISTANT Resistant 2 SENSITIVE Sensitive RIFAMPIN <=0.5 SENSI... Sensitive <=0.5 SENSI... Sensitive TETRACYCLINE <=1 SENSITIVE Sensitive <=1 SENSITIVE Sensitive TRIMETH/SULFA <=10 SENSIT Sensitive <=10 SENSIT Sensitive ... Marland Kitchen.. VANCOMYCIN 1 SENSITIVE Sensitive <=0.5 SENSI... Sensitive Right foot bone . Component 3 wk ago Specimen Description BONE Special Requests RIGHT 4 METATARSAL SAMPLE B Gram Stain NO WBC SEEN NO ORGANISMS SEEN Culture RARE METHICILLIN RESISTANT STAPHYLOCOCCUS AUREUS NO ANAEROBES ISOLATED Performed at [facility], [place] [address], [place], [place] [address] Report Status 10/08/2019 FINAL Organism ID, Bacteria METHICILLIN RESISTANT STAPHYLOCOCCUS AUREUS Resulting Agency [facility] Susceptibility Methicillin resistant staphylococcus aureus MIC CIPROFLOXACIN >=8 RESISTANT Resistant CLINDAMYCIN <=0.25 SENS... Sensitive ERYTHROMYCIN >=8 RESISTANT Resistant GENTAMICIN <=0.5 SENSI... Sensitive Inducible Clindamycin NEGATIVE Sensitive OXACILLIN >=4 RESISTANT Resistant RIFAMPIN <=0.5 SENSI... Sensitive TETRACYCLINE <=1 SENSITIVE Sensitive TRIMETH/SULFA <=10  SENSIT Sensitive ... VANCOMYCIN 1 SENSITIVE Sensitive This is a patient we had in clinic earlier this year with a wound over his left fifth metatarsal head. 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 Doran Durand on 05/16/2019. He seems to have developed problems with wounds on his bilateral feet in June 2021 although he may have had problems earlier than this. He was in an urgent care with a right foot ulcer on 09/26/2019 and given a course of doxycycline. This was apparently after having trouble getting into see orthopedics. He was seen by podiatry on 09/28/2019 noted to have bilateral lower extremity ulcers including the left lateral fifth metatarsal base and the right subfifth met head. It was noted that had purulent drainage at that time. He required hospitalization from 6/20 through 7/2. This was because of worsening right foot wounds. He underwent bilateral operative incision and drainage and bone biopsies bilaterally. Culture results are listed above. He has been referred back to clinic by Dr. Jacqualyn Posey of podiatry. He is also followed by Dr. Megan Salon who saw him yesterday. He was discharged from hospital on Zyvox Flagyl and Levaquin and yesterday changed to doxycycline Flagyl and Levaquin. His inflammatory markers on 6/26 showed a sedimentation rate of 129 and a C-reactive protein of 5. This is improved to 14 and 1.3 respectively. This would indicate improvement. ABIs in our clinic today were 1.23  on the right and 1.20 on the left 11/01/2019 on evaluation today patient appears to be doing fairly well in regard to the wounds on his feet at this point. Fortunately there is no signs of active infection at this time. No fevers,  chills, nausea, vomiting, or diarrhea. He currently is seeing infectious disease and still under their care at this point. Subsequently he also has both wounds which she has not been using collagen on as he did not receive that in his packaging he did not call us and let us know that. Apparently that just was missed on the order. Nonetheless we will get that straightened out today. 8/9-Patient returns for bilateral foot wounds, using Prisma with hydrogel moistened dressings, and the wounds appear stable. Patient using surgical shoes, avoiding much pressure or weightbearing as much as possible 8/16; patient has bilateral foot wounds. 1 on the right lateral foot proximally the other is on the left mid lateral foot. Both required debridement of callus and thick skin around the wounds. We have been using silver collagen 8/27; patient has bilateral lateral foot wounds. The area on the left substantially surrounded by callus and dry skin. This was removed from the wound edge. The underlying wound is small. The area on the right measured somewhat smaller today. 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 Unfortunately I did not update his antibiotics during today's visit. 9/10 I reviewed Dr. Hale Bogus last notes he felt he had completed antibiotics his inflammatory markers were reasonably well controlled. He has a small wound on the lateral left foot and a tiny area on the right which is just above closed. He is using Hydrofera Blue with border foam he has bilateral surgical shoes 9/24; 2 week f/u. doing well. right foot is closed. left foot still undermined. 10/14; right foot remains closed at the fifth met head. The area over the base of the left fifth metatarsal has a small open area but considerable undermining towards the plantar foot. Thick callus skin around this suggests an adequate pressure relief. We have talked about this. He says  he is going to go back into his cam boot. I suggested a total contact cast he did not seem enamored with this suggestion 10/26; left foot base of the fifth metatarsal. Same condition as last time. He has skin over the area with an open wound however the skin is not adherent. He went to see Dr. Earleen Newport who did an x-ray and culture of his foot I have not reviewed the x-ray but the patient was not told anything. He is on doxycycline 11/11; since the patient was last here he was in the emergency room on 10/30 he was concerned about swelling in the left foot. They did not do any cultures or x-rays. They changed his antibiotics to cephalexin. Previous culture showed group B strep. The cephalexin is appropriate as doxycycline has less than predictable coverage. Arrives in clinic today with swelling over this area under the wound. He also has a new wound on the right fifth metatarsal head 11/18; the patient has a difficult wound on the lateral aspect of the left fifth metatarsal head. The wound was almost ballotable last week I opened it slightly expecting to see purulence however there was just bleeding. I cultured this this was negative. X-ray unchanged. We are trying to get an MRI but I am not sure were going to be able to get this through his insurance. He  also has an area on the right lateral fifth metatarsal head this looks healthier 12/3; the patient finally got our MRI. Surprisingly this did not show osteomyelitis. I did show the soft tissue ulceration at the lateral plantar aspect of the fifth metatarsal base with a tiny residual 6 mm abscess overlying the superficial fascia I have tried to culture this area I have not been able to get this to grow anything. Nevertheless the protruding tissue looks aggravated. I suspect we should try to treat the underlying "abscess with broad-spectrum antibiotics. I am going to start him on Levaquin and Flagyl. He has much less edema in his legs and I am going to  continue to wrap his legs and see him weekly 12/10. I started Levaquin and Flagyl on him last week. He just picked up the Flagyl apparently there was some delay. The worry is the wound on the left fifth metatarsal base which is substantial and worsening. His foot looks like he inverts at the ankle making this a weightbearing surface. Certainly no improvement in fact I think the measurements of this are somewhat worse. We have been using 12/17; he apparently just got the Levaquin yesterday this is 2 weeks after the fact. He has completed the Flagyl. The area over the left fifth metatarsal base still has protruding granulation tissue although it does not look quite as bad as it did some weeks ago. He has severe bilateral lymphedema although we have not been treating him for wounds on his legs this is definitely going to require compression. There was so much edema in the left I did not wish to put him in a total contact cast today. I am going to increase his compression from 3-4 layer. The area on the right lateral fifth met head actually look quite good and superficial. 12/23; patient arrived with callus on the right fifth met head and the substantial hyper granulated callused wound on the base of his fifth metatarsal. He says he is completing his Levaquin in 2 days but I do not think that adds up with what I gave him but I will have to double check this. We are using Hydrofera Blue on both areas. My plan is to put the left leg in a cast the week after New Year's 04/06/2020; patient's wounds about the same. Right lateral fifth metatarsal head and left lateral foot over the base of the fifth metatarsal. There is undermining on the left lateral foot which I removed before application of total contact cast continuing with Hydrofera Blue new. Patient tells me he was seen by endocrinology today lab work was done [Dr. Kerr]. Also wondering whether he was referred to cardiology. I went over some lab work from  previously does not have chronic renal failure certainly not nephrotic range proteinuria he does have very poorly controlled diabetes but this is not his most updated lab work. Hemoglobin A1c has been over 11 1/10; the patient had a considerable amount of leakage towards mid part of his left foot with macerated skin however the wound surface looks better the area on the right lateral fifth met head is better as well. I am going to change the dressing on the left foot under the total contact cast to silver alginate, continue with Hydrofera Blue on the right. 1/20; patient was in the total contact cast for 10 days. Considerable amount of drainage although the skin around the wound does not look too bad on the left foot. The area on the right fifth  metatarsal head is closed. Our nursing staff reports large amount of drainage out of the left lateral foot wound 1/25; continues with copious amounts of drainage described by our intake staff. PCR culture I did last week showed E. coli and Enterococcus faecalis and low quantities. Multiple resistance genes documented including extended spectrum beta lactamase, MRSA, MRSE, quinolone, tetracycline. The wound is not quite as good this week as it was 5 days ago but about the same size 2/3; continues with copious amounts of malodorous drainage per our intake nurse. The PCR culture I did 2 weeks ago showed E. coli and low quantities of Enterococcus. There were multiple resistance genes detected. I put Neosporin on him last week although this does not seem to have helped. The wound is slightly deeper today. Offloading continues to be an issue here although with the amount of drainage she has a total contact cast is just not going to work 2/10; moderate amount of drainage. Patient reports he cannot get his stocking on over the dressing. I told him we have to do that the nurse gave him suggestions on how to make this work. The wound is on the bottom and lateral part of  his left foot. Is cultured predominantly grew low amounts of Enterococcus, E. coli and anaerobes. There were multiple resistance genes detected including extended spectrum beta lactamase, quinolone, tetracycline. I could not think of an easy oral combination to address this so for now I am going to do topical antibiotics provided by [facility] I think the main agents here are vancomycin and an aminoglycoside. We have to be able to give him access to the wounds to get the topical antibiotic on 2/17; moderate amount of drainage this is unchanged. He has his Keystone topical antibiotic against the deep tissue culture organisms. He has been using this and changing the dressing daily. Silver alginate on the wound surface. 2/24; using Keystone antibiotic with silver alginate on the top. He had too much drainage for a total contact cast at one point although I think that is improving and I think in the next week or 2 it might be possible to replace a total contact cast I did not do this today. In general the wound surface looks healthy however he continues to have thick rims of skin and subcutaneous tissue around the wide area of the circumference which I debrided 06/04/2020 upon evaluation today patient appears to be doing well in regard to his wound. I do feel like he is showing signs of improvement. There is little bit of callus and dead tissue around the edges of the wound as well as what appears to be a little bit of a sinus tract that is off to the side laterally I would perform debridement to clear that away today. 3/17; left lateral foot. The wound looks about the same as I remember. Not much depth surface looks healthy. No evidence of infection 3/25; left lateral foot. Wound surface looks about the same. Separating epithelium from the circumference. There really is no evidence of infection here however not making progress by my view 3/29; left lateral foot. Surface of the wound again looks reasonably  healthy still thick skin and subcutaneous tissue around the wound margins. There is no evidence of infection. One of the concerns being brought up by the nurses has again the amount of drainage vis--vis continued use of a total contact cast 4/5; left lateral foot at roughly the base of the fifth metatarsal. Nice healthy looking granulated tissue with  rims of epithelialization. The overall wound measurements are not any better but the tissue looks healthy. The only concern is the amount of drainage although he has no surrounding maceration with what we have been doing recently to absorb fluid and protect his skin. He also has lymphedema. 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 He states that the kerlix/coban under the TCC rolled down and could not get it back up. He has been using an offloading scooter and has somehow hurt his right foot using this device. This happened last week. He states that the side of his right foot developed a blister and opened. The top of his foot also has a few small open wounds he thinks is due to his socks rubbing in his shoes. He has not been using any dressings to the wound. He denies purulent drainage, fever/chills or erythema to the wounds. 4/22; patient presents for 1 week follow-up. He developed new wounds to the right foot that were evaluated at last clinic visit. He continues to have a total contact cast to the left leg and he reports no issues. He has been using silver collagen to the right foot wounds with no issues. He denies purulent drainage, fever/chills or erythema to the right foot wounds. He has no complaints today Electronic Signature(s) Signed: 07/24/2020 12:51:47 PM By: [name] DO Entered By:  [name] on 07/24/2020 12:42:16 -------------------------------------------------------------------------------- Physical Exam Details Patient Name: Date of Service: [name], [name] 07/21/2020 3:00 PM Medical Record Number: [id number] Patient Account Number: [id number] Date of Birth/Sex: Treating RN: Jun 17, 1985 (35 y.o. [name]: [name] Other Clinician: Referring Provider: Treating Provider/Extender: [name] in Treatment: 24 Constitutional respirations regular, non-labored and within target range for patient.Marland Kitchen Psychiatric pleasant and cooperative. Notes Left plantar foot wound: There is circumferential callus. There is healthy granulation tissue present. No signs of infection Right lateral foot: Subcutaneous tissue exposed but appears healthy. No signs of infection. Right dorsum of foot: Epithelialized previous wound site Electronic Signature(s) Signed: 07/24/2020 12:51:47 PM By: [name] DO Entered By: [name] on 07/24/2020 12:44:04 -------------------------------------------------------------------------------- Physician Orders Details Patient Name: Date of Service: [name], [name] 07/21/2020 3:00 PM Medical Record Number: [id number] Patient Account Number: [id number] Date of Birth/Sex: Treating RN: 1985/09/04 (35 y.o. [name], [name] Primary Care Provider: [name] Other Clinician: Referring Provider: Treating Provider/Extender: [name] in Treatment: 40 Verbal / Phone Orders: No Diagnosis Coding ICD-10 Coding Code Description E11.621 Type 2 diabetes mellitus with foot ulcer L97.528 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ity L97.518 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ity Follow-up Appointments ppointment in 1 week. - on Monday Return A Bathing/  Shower/ Hygiene May shower with protection but do not get wound dressing(s) wet. - use a cast protector to left leg. May shower and wash wound with soap and water. - with dressing changes only to right foot. Edema Control - Lymphedema / SCD / Other Bilateral Lower Extremities Elevate legs to the level of the heart or above for 30 minutes daily and/or when sitting, a frequency of: - throughout the day Avoid standing  for long periods of time. Exercise regularly Moisturize legs daily. - right leg every night before bed. Compression stocking or Garment 20-30 mm/Hg pressure to: - Apply to right leg in the morning and remove at night. Off-Loading Total Contact Cast to Left Lower Extremity Wound Treatment Wound #3 - Foot Wound Laterality: Left, Lateral Cleanser: Soap and Water 1 x Per Week/30 Days Discharge Instructions: May shower and wash wound with dial antibacterial soap and water prior to dressing change. Peri-Wound Care: Zinc Oxide Ointment 30g tube 1 x Per Week/30 Days Discharge Instructions: Apply Zinc Oxide to periwound with each dressing change as needed. Prim Dressing: Hydrofera Blue Classic Foam, 4x4 in 1 x Per Week/30 Days ary Discharge Instructions: Moisten with saline prior to applying to wound bed Secondary Dressing: Woven Gauze Sponge, Non-Sterile 4x4 in 1 x Per Week/30 Days Discharge Instructions: Apply over primary dressing as directed. Secondary Dressing: ABD Pad, 8x10 1 x Per Week/30 Days Discharge Instructions: T down before applying kerlix. Apply over primary dressing as directed. ape Secondary Dressing: Zetuvit Plus 4x8 in 1 x Per Week/30 Days Discharge Instructions: Apply over primary dressing as directed. Secured With: [employer], 4.5x3.1 (in/yd) 1 x Per Week/30 Days Discharge Instructions: Secure with Kerlix as directed. Add-Ons: 1 x Per Week/30 Days Wound #5 - Metatarsal head fifth Wound Laterality: Right, Lateral Cleanser: Soap and Water Every Other  Day/30 Days Discharge Instructions: May shower and wash wound with dial antibacterial soap and water prior to dressing change. Prim Dressing: KerraCel Ag Gelling Fiber Dressing, 4x5 in (silver alginate) Every Other Day/30 Days ary Discharge Instructions: Apply silver alginate to wound bed as instructed Secondary Dressing: ABD Pad, 8x10 Every Other Day/30 Days Discharge Instructions: Apply over primary dressing as directed. Secondary Dressing: Optifoam Non-Adhesive Dressing, 4x4 in Every Other Day/30 Days Discharge Instructions: Apply over primary dressing as directed. Secured With: [employer], 4.5x3.1 (in/yd) Every Other Day/30 Days Discharge Instructions: Secure with Kerlix as directed. Secured With: 106M Medipore H Soft Cloth Surgical T 4 x 2 (in/yd) Every Other Day/30 Days ape Discharge Instructions: Secure dressing with tape as directed. Wound #6 - Ankle Wound Laterality: Right, Anterior Cleanser: Soap and Water Every Other Day/30 Days Discharge Instructions: May shower and wash wound with dial antibacterial soap and water prior to dressing change. Prim Dressing: KerraCel Ag Gelling Fiber Dressing, 4x5 in (silver alginate) Every Other Day/30 Days ary Discharge Instructions: Apply silver alginate to wound bed as instructed Secondary Dressing: ABD Pad, 8x10 Every Other Day/30 Days Discharge Instructions: Apply over primary dressing as directed. Secondary Dressing: Optifoam Non-Adhesive Dressing, 4x4 in Every Other Day/30 Days Discharge Instructions: Apply over primary dressing as directed. Secured With: [employer], 4.5x3.1 (in/yd) Every Other Day/30 Days Discharge Instructions: Secure with Kerlix as directed. Secured With: 106M Medipore H Soft Cloth Surgical T 4 x 2 (in/yd) Every Other Day/30 Days ape Discharge Instructions: Secure dressing with tape as directed. Electronic Signature(s) Signed: 07/24/2020 12:51:47 PM By: [name] DO Entered By: [name]  on 07/24/2020 12:44:39 -------------------------------------------------------------------------------- Problem List Details Patient Name: Date of Service: [name], [name] 07/21/2020 3:00 PM Medical Record Number: [id number] Patient Account Number: [id number] Date of Birth/Sex: Treating RN: 03-24-1986 (36 y.o. [name]: [name] Other Clinician: Referring Provider: Treating Provider/Extender: [name] in Treatment: 61 Active Problems ICD-10 Encounter Code Description Active Date MDM Diagnosis E11.621 Type 2 diabetes mellitus with foot ulcer 10/24/2019 No Yes L97.528 Non-pressure chronic ulcer of  other part of left foot with other specified 10/24/2019 No Yes severity L97.518 Non-pressure chronic ulcer of other part of right foot with other specified 07/14/2020 No Yes severity Inactive Problems ICD-10 Code Description Active Date Inactive Date L97.518 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ity 10/24/2019 10/24/2019 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, right ankle and foot 10/24/2019 10/24/2019 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, left ankle and foot 10/24/2019 10/24/2019 B95.62 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 10/24/2019 10/24/2019 classified elsewhere Resolved Problems Electronic Signature(s) Signed: 07/24/2020 12:51:47 PM By: [name] DO Entered By: [name] on 07/24/2020 12:39:59 -------------------------------------------------------------------------------- Progress Note Details Patient Name: Date of Service: [name], [name] 07/21/2020 3:00 PM Medical Record Number: [id number] Patient Account Number: [id number] Date of Birth/Sex: Treating RN: 1985/11/18 (35 y.o. 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 He comes in for review of a wound over the plantar fifth metatarsal head extending into the lateral part of the foot. He was followed for this previously by his podiatrist Dr. Cornelius Moras. As the patient tells his story he went to see podiatry first for a swelling he developed on the lateral part of his fifth metatarsal head in May. He states this was "open" by podiatry and the area closed. He was followed up in June and it was again opened callus removed and it closed promptly. There were plans being made for surgery on the fifth metatarsal head in June however his blood sugar was apparently too high for anesthesia. Apparently the area was debrided and opened again in June and it is never closed since. Looking over the records from podiatry I am really not able to follow this. It was clear when he was first seen it was before 5/14 at that point he already had a wound. By 5/17 the ulcer was resolved. I do not see anything about a procedure. On 5/28 noted to have pre-ulcerative moderate keratosis. X-ray noted 1/5 contracted toe and tailor's bunion and metatarsal deformity. On a visit date on 09/28/2018 the dorsal part of the left foot it healed and resolved. There was concern about swelling in his lower extremity he was sent to the ER.. As far as I can tell he was seen in the ER on 7/12 with an ulcer on his left foot. A DVT rule out of the left leg was negative. I do not think I have complete  records from podiatry but I am not able to verify the procedures this patient states he had. He states after the last procedure the wound has never closed although I am not able to follow this in the records I have from podiatry. He has not had a recent x-ray The patient has been using Neosporin on the wound. He is wearing a Darco shoe. He is still very active up on his foot working and exercising. Past medical history; type 2 diabetes ketosis-prone, leg swelling with a negative DVT study in July. Non-smoker ABI in our clinic  was 0.85 on the left 10/16; substantial wound on the plantar left fifth met head extending laterally almost to the dorsal fifth MTP. We have been using silver alginate we gave him a Darco forefoot off loader. An x-ray did not show evidence of osteomyelitis did note soft tissue emphysema which I think was due to gas tracking through an open wound. There is no doubt in my mind he requires an MRI 10/23; MRI not booked until 3 November at the earliest this is largely due to his glucose sensor in the right arm. We have been using silver alginate. There has been an improvement 10/29; I am still not exactly sure when his MRI is booked for. He says it is the third but it is the 10th in epic. This definitely needs to be done. He is running a low-grade fever today but no other symptoms. No real improvement in the 1 02/26/2019 patient presents today for a follow-up visit here in our clinic he is last been seen in the clinic on October 29. Subsequently we were working on getting MRI to evaluate and see what exactly was going on and where we would need to go from the standpoint of whether or not he had osteomyelitis and again what treatments were going be required. Subsequently the patient ended up being admitted to the hospital on 02/07/2019 and was discharged on 02/14/2019. This is a somewhat interesting admission with a discharge diagnosis of pneumonia due to COVID-19 although he was  positive for COVID-19 when tested at the urgent care but negative x2 when he was actually in the hospital. With that being said he did have acute respiratory failure with hypoxia and it was noted he also have a left foot ulceration with osteomyelitis. With that being said he did require oxygen for his pneumonia and I level 4 L. He was placed on antivirals and steroids for the COVID-19. He was also transferred to the [place] at one point. Nonetheless he did subsequently discharged home and since being home has done much better in that regard. The CT angiogram did not show any pulmonary embolism. With regard to the osteomyelitis the patient was placed on vancomycin and Zosyn while in the hospital but has been changed to Augmentin at discharge. It was also recommended that he follow- up with wound care and podiatry. Podiatry however wanted him to see [name] according to the patient prior to them doing anything further. His hemoglobin A1c was 9.9 as noted in the hospital. Have an MRI of the left foot performed while in the hospital on 02/04/2019. 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 There is an overlying plantar open wound noted an abscess tracking back along the lateral aspect of the fifth metatarsal shaft. There is otherwise diffuse cellulitis and mild fasciitis without findings of polymyositis. The patient did have recently pneumonia secondary to COVID-19 I looked in the chart through epic and it does appear that the patient may need to have an additional x-ray just to ensure everything is cleared and that he has no airspace disease prior to putting him into the chamber. 03/05/2019; patient was readmitted to the clinic last week. He was hospitalized twice for a viral upper respiratory tract infection from 11/1 through 11/4 and then 11/5 through 11/12 ultimately this turned out to be Covid  pneumonitis. Although he was discharged on oxygen he is not using it. He says he feels fine.  He has no exercise limitation no cough no sputum. His O2 sat in our clinic today was 100% on room air. 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 He received Vanco and Zosyn in the hospital and then was discharged on 2 weeks of Augmentin. I do not see any relevant cultures. He was supposed to follow-up with infectious disease but I do not see that he has an appointment. 12/8; patient saw Dr. Novella Olive of infectious disease last week. He felt that he had had adequate antibiotic therapy. He did not go to follow-up with Dr. Amalia Hailey of podiatry and I have again talked to him about the pros and cons of this. He does not want to consider a ray amputation of this time. He is aware of the risks of recurrence, migration etc. He started HBO today and tolerated this well. He can complete the Augmentin that I gave him last week. I have looked over the lab work that Dr. Chana Bode ordered his C-reactive protein was 3.3 and his sedimentation rate was 17. The C-reactive protein is never really been measurably that high in this patient 12/15; not much change in the wound today however he has undermining along the lateral part of the foot again more extensively than last week. He has some rims of epithelialization. We have been using silver alginate. He is undergoing hyperbarics but did not dive today 12/18; in for his obligatory first total contact cast change. Unfortunately there was pus coming from the undermining area around his fifth metatarsal head. This was cultured but will preclude reapplication of a cast. He is seen in conjunction with HBO 12/24; patient had staph lugdunensis in the wound in the undermining area laterally last time. We put him on doxycycline which should have covered this. The wound looks  better today. I am going to give him another week of doxycycline before reattempting the total contact cast 12/31; the patient is completing antibiotics. Hemorrhagic debris in the distal part of the wound with some undermining distally. He also had hyper granulation. Extensive debridement with a #5 curette. The infected area that was on the lateral part of the fifth met head is closed over. I do not think he needs any more antibiotics. Patient was seen prior to HBO. Preparations for a total contact cast were made in the cast will be placed post hyperbarics 04/11/19; once again the patient arrives today without complaint. He had been in a cast all week noted that he had heavy drainage this week. This resulted in large raised areas of macerated tissue around the wound 1/14; wound bed looks better slightly smaller. Hydrofera Blue has been changing himself. He had a heavy drainage last week which caused a lot of maceration around the wound so I took him out of a total contact cast he says the drainage is actually better this week He is seen today in conjunction with HBO 1/21; returns to clinic. He was up in [place] for a day or 2 attending a funeral. He comes back in with the wound larger and with a large area of exposed bone. He had osteomyelitis and septic arthritis of the fifth left metatarsal head while he was in hospital. He received IV antibiotics in the hospital for a prolonged period of time then 3 weeks of Augmentin. Subsequently I gave him 2 weeks of doxycycline for more superficial wound infection. When I saw this last  week the wound was smaller the surface of the wound looks satisfactory. 1/28; patient missed hyperbarics today. Bone biopsy I did last time showed Enterococcus faecalis and Staphylococcus lugdunensis . He has a wide area of exposed bone. We are going to use silver alginate as of today. I had another ethical discussion with the patient. This would be recurrent osteomyelitis he is  already received IV antibiotics. In this situation I think the likelihood of healing this is low. Therefore I have recommended a ray amputation and with the patient's agreement I have referred him to Dr. Doran Durand. The other issue is that his compliance with hyperbarics has been minimal because of his work schedule and given his underlying decision I am going to stop this today READMISSION 10/24/2019 MRI 09/29/2019 left foot IMPRESSION: 1. Apparent skin ulceration inferior and lateral to the 5th metatarsal base with underlying heterogeneous T2 signal and enhancement in the subcutaneous fat. Small peripherally enhancing fluid collections along the plantar and lateral aspects of the 5th metatarsal base suspicious for abscesses. 2. Interval amputation through the mid 5th metatarsal with nonspecific low-level marrow edema and enhancement. Given the proximity to the adjacent soft tissue inflammatory changes, osteomyelitis cannot be excluded. 3. The additional bones appear unremarkable. MRI 09/29/2019 right foot IMPRESSION: 1. Soft tissue ulceration lateral to the 5th MTP joint. There is low-level T2 hyperintensity within the 4th and 5th metatarsal heads and adjacent proximal phalanges without abnormal T1 signal or cortical destruction. These findings are nonspecific and could be seen with early marrow edema, hyperemia or early osteomyelitis. No evidence of septic joint. 2. Mild tenosynovitis and synovial enhancement associated with the extensor digitorum tendons at the level of the midfoot. 3. Diffuse low-level muscular T2 hyperintensity and enhancement, most consistent with diabetic myopathy. LEFT FOOT BONE Methicillin resistant staphylococcus aureus Staphylococcus lugdunensis MIC MIC CIPROFLOXACIN >=8 RESISTANT Resistant <=0.5 SENSI... Sensitive CLINDAMYCIN <=0.25 SENS... Sensitive >=8 RESISTANT Resistant ERYTHROMYCIN >=8 RESISTANT Resistant >=8 RESISTANT Resistant GENTAMICIN <=0.5  SENSI... Sensitive <=0.5 SENSI... Sensitive Inducible Clindamycin NEGATIVE Sensitive NEGATIVE Sensitive OXACILLIN >=4 RESISTANT Resistant 2 SENSITIVE Sensitive RIFAMPIN <=0.5 SENSI... Sensitive <=0.5 SENSI... Sensitive TETRACYCLINE <=1 SENSITIVE Sensitive <=1 SENSITIVE Sensitive TRIMETH/SULFA <=10 SENSIT Sensitive <=10 SENSIT Sensitive ... Marland Kitchen.. VANCOMYCIN 1 SENSITIVE Sensitive <=0.5 SENSI... Sensitive Right foot bone . Component 3 wk ago Specimen Description BONE Special Requests RIGHT 4 METATARSAL SAMPLE B Gram Stain NO WBC SEEN NO ORGANISMS SEEN Culture RARE METHICILLIN RESISTANT STAPHYLOCOCCUS AUREUS NO ANAEROBES ISOLATED Performed at [facility], [place] [address] [place], [place] [address] Report Status 10/08/2019 FINAL Organism ID, Bacteria METHICILLIN RESISTANT STAPHYLOCOCCUS AUREUS Resulting Agency [facility] Susceptibility Methicillin resistant staphylococcus aureus MIC CIPROFLOXACIN >=8 RESISTANT Resistant CLINDAMYCIN <=0.25 SENS... Sensitive ERYTHROMYCIN >=8 RESISTANT Resistant GENTAMICIN <=0.5 SENSI... Sensitive Inducible Clindamycin NEGATIVE Sensitive OXACILLIN >=4 RESISTANT Resistant RIFAMPIN <=0.5 SENSI... Sensitive TETRACYCLINE <=1 SENSITIVE Sensitive TRIMETH/SULFA <=10 SENSIT Sensitive ... VANCOMYCIN 1 SENSITIVE Sensitive This is a patient we had in clinic earlier this year with a wound over his left fifth metatarsal head. 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 Doran Durand on 05/16/2019. He seems to have developed problems with wounds on his bilateral feet in June 2021 although he may have had problems earlier than this. He was in an urgent care  with a right foot ulcer on  09/26/2019 and given a course of doxycycline. This was apparently after having trouble getting into see orthopedics. He was seen by podiatry on 09/28/2019 noted to have bilateral lower extremity ulcers including the left lateral fifth metatarsal base and the right subfifth met head. It was noted that had purulent drainage at that time. He required hospitalization from 6/20 through 7/2. This was because of worsening right foot wounds. He underwent bilateral operative incision and drainage and bone biopsies bilaterally. Culture results are listed above. He has been referred back to clinic by Dr. Jacqualyn Posey of podiatry. He is also followed by Dr. Megan Salon who saw him yesterday. He was discharged from hospital on Zyvox Flagyl and Levaquin and yesterday changed to doxycycline Flagyl and Levaquin. His inflammatory markers on 6/26 showed a sedimentation rate of 129 and a C-reactive protein of 5. This is improved to 14 and 1.3 respectively. This would indicate improvement. ABIs in our clinic today were 1.23 on the right and 1.20 on the left 11/01/2019 on evaluation today patient appears to be doing fairly well in regard to the wounds on his feet at this point. Fortunately there is no signs of active infection at this time. No fevers, chills, nausea, vomiting, or diarrhea. He currently is seeing infectious disease and still under their care at this point. Subsequently he also has both wounds which she has not been using collagen on as he did not receive that in his packaging he did not call us and let us know that. Apparently that just was missed on the order. Nonetheless we will get that straightened out today. 8/9-Patient returns for bilateral foot wounds, using Prisma with hydrogel moistened dressings, and the wounds appear stable. Patient using surgical shoes, avoiding much pressure or weightbearing as much as possible 8/16; patient has bilateral foot wounds. 1 on the right lateral foot proximally the other is on  the left mid lateral foot. Both required debridement of callus and thick skin around the wounds. We have been using silver collagen 8/27; patient has bilateral lateral foot wounds. The area on the left substantially surrounded by callus and dry skin. This was removed from the wound edge. The underlying wound is small. The area on the right measured somewhat smaller today. 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 Unfortunately I did not update his antibiotics during today's visit. 9/10 I reviewed Dr. Hale Bogus last notes he felt he had completed antibiotics his inflammatory markers were reasonably well controlled. He has a small wound on the lateral left foot and a tiny area on the right which is just above closed. He is using Hydrofera Blue with border foam he has bilateral surgical shoes 9/24; 2 week f/u. doing well. right foot is closed. left foot still undermined. 10/14; right foot remains closed at the fifth met head. The area over the base of the left fifth metatarsal has a small open area but considerable undermining towards the plantar foot. Thick callus skin around this suggests an adequate pressure relief. We have talked about this. He says he is going to go back into his cam boot. I suggested a total contact cast he did not seem enamored with this suggestion 10/26; left foot base of the fifth metatarsal. Same condition as last time. He has skin over the area with an open wound however the skin is not adherent. He went to see Dr. Earleen Newport who did an x-ray and culture  of his foot I have not reviewed the x-ray but the patient was not told anything. He is on doxycycline 11/11; since the patient was last here he was in the emergency room on 10/30 he was concerned about swelling in the left foot. They did not do any cultures or x-rays. They changed his antibiotics to cephalexin. Previous culture showed group B strep. The  cephalexin is appropriate as doxycycline has less than predictable coverage. Arrives in clinic today with swelling over this area under the wound. He also has a new wound on the right fifth metatarsal head 11/18; the patient has a difficult wound on the lateral aspect of the left fifth metatarsal head. The wound was almost ballotable last week I opened it slightly expecting to see purulence however there was just bleeding. I cultured this this was negative. X-ray unchanged. We are trying to get an MRI but I am not sure were going to be able to get this through his insurance. He also has an area on the right lateral fifth metatarsal head this looks healthier 12/3; the patient finally got our MRI. Surprisingly this did not show osteomyelitis. I did show the soft tissue ulceration at the lateral plantar aspect of the fifth metatarsal base with a tiny residual 6 mm abscess overlying the superficial fascia I have tried to culture this area I have not been able to get this to grow anything. Nevertheless the protruding tissue looks aggravated. I suspect we should try to treat the underlying "abscess with broad-spectrum antibiotics. I am going to start him on Levaquin and Flagyl. He has much less edema in his legs and I am going to continue to wrap his legs and see him weekly 12/10. I started Levaquin and Flagyl on him last week. He just picked up the Flagyl apparently there was some delay. The worry is the wound on the left fifth metatarsal base which is substantial and worsening. His foot looks like he inverts at the ankle making this a weightbearing surface. Certainly no improvement in fact I think the measurements of this are somewhat worse. We have been using 12/17; he apparently just got the Levaquin yesterday this is 2 weeks after the fact. He has completed the Flagyl. The area over the left fifth metatarsal base still has protruding granulation tissue although it does not look quite as bad as it did  some weeks ago. He has severe bilateral lymphedema although we have not been treating him for wounds on his legs this is definitely going to require compression. There was so much edema in the left I did not wish to put him in a total contact cast today. I am going to increase his compression from 3-4 layer. The area on the right lateral fifth met head actually look quite good and superficial. 12/23; patient arrived with callus on the right fifth met head and the substantial hyper granulated callused wound on the base of his fifth metatarsal. He says he is completing his Levaquin in 2 days but I do not think that adds up with what I gave him but I will have to double check this. We are using Hydrofera Blue on both areas. My plan is to put the left leg in a cast the week after New Year's 04/06/2020; patient's wounds about the same. Right lateral fifth metatarsal head and left lateral foot over the base of the fifth metatarsal. There is undermining on the left lateral foot which I removed before application of total contact cast continuing  with Hydrofera Blue new. Patient tells me he was seen by endocrinology today lab work was done [Dr. Kerr]. Also wondering whether he was referred to cardiology. I went over some lab work from previously does not have chronic renal failure certainly not nephrotic range proteinuria he does have very poorly controlled diabetes but this is not his most updated lab work. Hemoglobin A1c has been over 11 1/10; the patient had a considerable amount of leakage towards mid part of his left foot with macerated skin however the wound surface looks better the area on the right lateral fifth met head is better as well. I am going to change the dressing on the left foot under the total contact cast to silver alginate, continue with Hydrofera Blue on the right. 1/20; patient was in the total contact cast for 10 days. Considerable amount of drainage although the skin around the wound  does not look too bad on the left foot. The area on the right fifth metatarsal head is closed. Our nursing staff reports large amount of drainage out of the left lateral foot wound 1/25; continues with copious amounts of drainage described by our intake staff. PCR culture I did last week showed E. coli and Enterococcus faecalis and low quantities. Multiple resistance genes documented including extended spectrum beta lactamase, MRSA, MRSE, quinolone, tetracycline. The wound is not quite as good this week as it was 5 days ago but about the same size 2/3; continues with copious amounts of malodorous drainage per our intake nurse. The PCR culture I did 2 weeks ago showed E. coli and low quantities of Enterococcus. There were multiple resistance genes detected. I put Neosporin on him last week although this does not seem to have helped. The wound is slightly deeper today. Offloading continues to be an issue here although with the amount of drainage she has a total contact cast is just not going to work 2/10; moderate amount of drainage. Patient reports he cannot get his stocking on over the dressing. I told him we have to do that the nurse gave him suggestions on how to make this work. The wound is on the bottom and lateral part of his left foot. Is cultured predominantly grew low amounts of Enterococcus, E. coli and anaerobes. There were multiple resistance genes detected including extended spectrum beta lactamase, quinolone, tetracycline. I could not think of an easy oral combination to address this so for now I am going to do topical antibiotics provided by [facility] I think the main agents here are vancomycin and an aminoglycoside. We have to be able to give him access to the wounds to get the topical antibiotic on 2/17; moderate amount of drainage this is unchanged. He has his Keystone topical antibiotic against the deep tissue culture organisms. He has been using this and changing the dressing daily.  Silver alginate on the wound surface. 2/24; using Keystone antibiotic with silver alginate on the top. He had too much drainage for a total contact cast at one point although I think that is improving and I think in the next week or 2 it might be possible to replace a total contact cast I did not do this today. In general the wound surface looks healthy however he continues to have thick rims of skin and subcutaneous tissue around the wide area of the circumference which I debrided 06/04/2020 upon evaluation today patient appears to be doing well in regard to his wound. I do feel like he is showing signs of improvement.  There is little bit of callus and dead tissue around the edges of the wound as well as what appears to be a little bit of a sinus tract that is off to the side laterally I would perform debridement to clear that away today. 3/17; left lateral foot. The wound looks about the same as I remember. Not much depth surface looks healthy. No evidence of infection 3/25; left lateral foot. Wound surface looks about the same. Separating epithelium from the circumference. There really is no evidence of infection here however not making progress by my view 3/29; left lateral foot. Surface of the wound again looks reasonably healthy still thick skin and subcutaneous tissue around the wound margins. There is no evidence of infection. One of the concerns being brought up by the nurses has again the amount of drainage vis--vis continued use of a total contact cast 4/5; left lateral foot at roughly the base of the fifth metatarsal. Nice healthy looking granulated tissue with rims of epithelialization. The overall wound measurements are not any better but the tissue looks healthy. The only concern is the amount of drainage although he has no surrounding maceration with what we have been doing recently to absorb fluid and protect his skin. He also has lymphedema. 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 He states that the kerlix/coban under the TCC rolled down and could not get it back up. He has been using an offloading scooter and has somehow hurt his right foot using this device. This happened last week. He states that the side of his right foot developed a blister and opened. The top of his foot also has a few small open wounds he thinks is due to his socks rubbing in his shoes. He has not been using any dressings to the wound. He denies purulent drainage, fever/chills or erythema to the wounds. 4/22; patient presents for 1 week follow-up. He developed new wounds to the right foot that were evaluated at last clinic visit. He continues to have a total contact cast to the left leg and he reports no issues. He has been using silver collagen to the right foot wounds with no issues. He denies purulent drainage, fever/chills or erythema to the right foot wounds. He has no complaints today Patient History Information obtained from Patient. Family History No family history of Cancer, Diabetes, Hereditary Spherocytosis, Hypertension, Kidney Disease, Lung Disease, Seizures, Stroke, Thyroid Problems, Tuberculosis. Social History Never smoker, Marital Status - Single, Alcohol Use - Rarely, Drug Use - No History, Caffeine Use - Never. Medical History Eyes Denies history of Cataracts, Glaucoma, Optic Neuritis Ear/Nose/Mouth/Throat Denies history of Chronic sinus problems/congestion, Middle ear problems Hematologic/Lymphatic Denies history of Anemia, Hemophilia, Human Immunodeficiency Virus, Lymphedema, Sickle Cell Disease Respiratory Denies history of Aspiration, Asthma, Chronic Obstructive Pulmonary Disease (COPD), Pneumothorax, Sleep Apnea,  Tuberculosis Cardiovascular Denies history of Angina, Arrhythmia, Congestive Heart Failure, Coronary Artery Disease, Deep Vein Thrombosis, Hypertension, Hypotension, Myocardial Infarction, Peripheral Arterial Disease, Peripheral Venous Disease, Phlebitis, Vasculitis Gastrointestinal Denies history of Cirrhosis , Colitis, Crohnoos, Hepatitis A, Hepatitis B, Hepatitis C Endocrine Patient has history of Type II Diabetes Denies history of Type I Diabetes Immunological Denies history of Lupus Erythematosus, Raynaudoos, Scleroderma Integumentary (Skin) Denies history of History  of Burn Musculoskeletal Denies history of Gout, Rheumatoid Arthritis, Osteoarthritis, Osteomyelitis Neurologic Denies history of Dementia, Neuropathy, Quadriplegia, Paraplegia, Seizure Disorder Oncologic Denies history of Received Chemotherapy, Received Radiation Psychiatric Denies history of Anorexia/bulimia, Confinement Anxiety Hospitalization/Surgery History - 11/1-11/06/2018- sepsis foot infection. - 11/4-11/5 02 sats low respiratory distress. Objective Constitutional respirations regular, non-labored and within target range for patient.. Vitals Time Taken: 3:18 PM, Height: 77 in, Weight: 280 lbs, BMI: 33.2, Temperature: 98.2 F, Pulse: 94 bpm, Respiratory Rate: 18 breaths/min, Blood Pressure: 145/91 mmHg, Capillary Blood Glucose: 149 mg/dl. Psychiatric pleasant and cooperative. General Notes: Left plantar foot wound: There is circumferential callus. There is healthy granulation tissue present. No signs of infection Right lateral foot: Subcutaneous tissue exposed but appears healthy. No signs of infection. Right dorsum of foot: Epithelialized previous wound site Integumentary (Hair, Skin) Wound #3 status is Open. Original cause of wound was Trauma. The date acquired was: 10/02/2019. The wound has been in treatment 38 weeks. The wound is located on the Left,Lateral Foot. The wound measures 2.7cm length x 2.7cm  width x 0.1cm depth; 5.726cm^2 area and 0.573cm^3 volume. There is Fat Layer (Subcutaneous Tissue) exposed. There is no tunneling or undermining noted. There is a medium amount of serosanguineous drainage noted. The wound margin is flat and intact. There is large (67-100%) red granulation within the wound bed. There is no necrotic tissue within the wound bed. Wound #5 status is Open. Original cause of wound was Gradually Appeared. The date acquired was: 07/07/2020. The wound has been in treatment 1 weeks. The wound is located on the Right,Lateral Metatarsal head fifth. The wound measures 2.5cm length x 3cm width x 0.5cm depth; 5.89cm^2 area and 2.945cm^3 volume. There is Fat Layer (Subcutaneous Tissue) exposed. There is no tunneling noted, however, there is undermining starting at 4:00 and ending at 9:00 with a maximum distance of 0.9cm. There is a medium amount of serosanguineous drainage noted. The wound margin is well defined and not attached to the wound base. There is large (67-100%) red, pink granulation within the wound bed. There is a small (1-33%) amount of necrotic tissue within the wound bed including Adherent Slough. Wound #6 status is Open. Original cause of wound was Gradually Appeared. The date acquired was: 06/02/2020. The wound has been in treatment 1 weeks. The wound is located on the Right,Anterior Ankle. The wound measures 0.2cm length x 0.2cm width x 0.1cm depth; 0.031cm^2 area and 0.003cm^3 volume. There is no tunneling or undermining noted. There is a small amount of serosanguineous drainage noted. The wound margin is distinct with the outline attached to the wound base. There is no granulation within the wound bed. There is no necrotic tissue within the wound bed. 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 The left foot ulcer had callus present and this was debrided. There is healthy granulation tissue present. We replaced the total contact cast. The right lateral foot also appears well-healing. This was debrided due to callus present. We will switch to silver alginate as there is some maceration noted and this will help with draining. Procedures Wound #3 Pre-procedure diagnosis of Wound #3 is a Diabetic Wound/Ulcer of the Lower Extremity located on the Left,Lateral Foot .Severity of Tissue Pre Debridement is: Fat layer exposed. There was a Excisional Skin/Subcutaneous Tissue Debridement  with a total area of 7.29 sq cm performed by [name], DO. With the following instrument(s): Blade, Curette, and Forceps to remove Viable and Non-Viable tissue/material. Material removed includes Callus, Subcutaneous Tissue, Slough, Skin: Dermis, and Skin: Epidermis after achieving pain control using Lidocaine. No specimens were taken. A time out was conducted at 16:09, prior to the start of the procedure. A Minimum amount of bleeding was controlled with Silver Nitrate. The procedure was tolerated well with a pain level of 0 throughout and a pain level of 0 following the procedure. Post Debridement Measurements: 2.7cm length x 2.7cm width x 0.1cm depth; 0.573cm^3 volume. Character of Wound/Ulcer Post Debridement is improved. Severity of Tissue Post Debridement is: Fat layer exposed. Post procedure Diagnosis Wound #3: Same as Pre-Procedure Pre-procedure diagnosis of Wound #3 is a Diabetic Wound/Ulcer of the Lower Extremity located on the Left,Lateral Foot . There was a T Programmer, multimedia otal Procedure by [name], DO. Post procedure Diagnosis Wound #3: Same as Pre-Procedure Wound #5 Pre-procedure diagnosis of Wound #5 is a Diabetic Wound/Ulcer of the Lower Extremity located on the Right,Lateral Metatarsal head fifth  .Severity of Tissue Pre Debridement is: Fat layer exposed. There was a Selective/Open Wound Non-Viable Tissue Debridement with a total area of 2.55 sq cm performed by [name], DO. With the following instrument(s): Blade, and Forceps to remove Viable and Non-Viable tissue/material. Material removed includes Callus after achieving pain control using Lidocaine. No specimens were taken. A time out was conducted at 16:08, prior to the start of the procedure. A Minimum amount of bleeding was controlled with Pressure. The procedure was tolerated well with a pain level of 0 throughout and a pain level of 0 following the procedure. Post Debridement Measurements: 2.5cm length x 3cm width x 0.5cm depth; 2.945cm^3 volume. Character of Wound/Ulcer Post Debridement is improved. Severity of Tissue Post Debridement is: Fat layer exposed. Post procedure Diagnosis Wound #5: Same as Pre-Procedure Plan Follow-up Appointments: Return Appointment in 1 week. - on Monday Bathing/ Shower/ Hygiene: May shower with protection but do not get wound dressing(s) wet. - use a cast protector to left leg. May shower and wash wound with soap and water. - with dressing changes only to right foot. Edema Control - Lymphedema / SCD / Other: Elevate legs to the level of the heart or above for 30 minutes daily and/or when sitting, a frequency of: - throughout the day Avoid standing for long periods of time. Exercise regularly Moisturize legs daily. - right leg every night before bed. Compression stocking or Garment 20-30 mm/Hg pressure to: - Apply to right leg in the morning and remove at night. Off-Loading: T Contact Cast to Left Lower Extremity otal WOUND #3: - Foot Wound Laterality: Left, Lateral Cleanser: Soap and Water 1 x Per Week/30 Days Discharge Instructions: May shower and wash wound with dial antibacterial soap and water prior to dressing change. Peri-Wound Care: Zinc Oxide Ointment 30g tube 1 x Per Week/30  Days Discharge Instructions: Apply Zinc Oxide to periwound with each dressing change as needed. Prim Dressing: Hydrofera Blue Classic Foam, 4x4 in 1 x Per Week/30 Days ary Discharge Instructions: Moisten with saline prior to applying to wound bed Secondary Dressing: Woven Gauze Sponge, Non-Sterile 4x4 in 1 x Per Week/30 Days Discharge Instructions: Apply over primary dressing as directed. Secondary Dressing: ABD Pad, 8x10 1 x Per Week/30 Days Discharge Instructions: T down before applying kerlix. Apply over primary dressing as directed. ape Secondary Dressing: Zetuvit Plus 4x8 in 1 x Per  Week/30 Days Discharge Instructions: Apply over primary dressing as directed. Secured With: [employer], 4.5x3.1 (in/yd) 1 x Per Week/30 Days Discharge Instructions: Secure with Kerlix as directed. Add-Ons: 1 x Per Week/30 Days WOUND #5: - Metatarsal head fifth Wound Laterality: Right, Lateral Cleanser: Soap and Water Every Other Day/30 Days Discharge Instructions: May shower and wash wound with dial antibacterial soap and water prior to dressing change. Prim Dressing: KerraCel Ag Gelling Fiber Dressing, 4x5 in (silver alginate) Every Other Day/30 Days ary Discharge Instructions: Apply silver alginate to wound bed as instructed Secondary Dressing: ABD Pad, 8x10 Every Other Day/30 Days Discharge Instructions: Apply over primary dressing as directed. Secondary Dressing: Optifoam Non-Adhesive Dressing, 4x4 in Every Other Day/30 Days Discharge Instructions: Apply over primary dressing as directed. Secured With: [employer], 4.5x3.1 (in/yd) Every Other Day/30 Days Discharge Instructions: Secure with Kerlix as directed. Secured With: 68M Medipore H Soft Cloth Surgical T 4 x 2 (in/yd) Every Other Day/30 Days ape Discharge Instructions: Secure dressing with tape as directed. WOUND #6: - Ankle Wound Laterality: Right, Anterior Cleanser: Soap and Water Every Other Day/30 Days Discharge  Instructions: May shower and wash wound with dial antibacterial soap and water prior to dressing change. Prim Dressing: KerraCel Ag Gelling Fiber Dressing, 4x5 in (silver alginate) Every Other Day/30 Days ary Discharge Instructions: Apply silver alginate to wound bed as instructed Secondary Dressing: ABD Pad, 8x10 Every Other Day/30 Days Discharge Instructions: Apply over primary dressing as directed. Secondary Dressing: Optifoam Non-Adhesive Dressing, 4x4 in Every Other Day/30 Days Discharge Instructions: Apply over primary dressing as directed. Secured With: [employer], 4.5x3.1 (in/yd) Every Other Day/30 Days Discharge Instructions: Secure with Kerlix as directed. Secured With: 68M Medipore H Soft Cloth Surgical T 4 x 2 (in/yd) Every Other Day/30 Days ape Discharge Instructions: Secure dressing with tape as directed. 1. T contact cast the left lower leg with Hydrofera Blue otal 2. Silver alginate to the right lateral foot wound to be changed every other day 3. Follow-up in 1 week Electronic Signature(s) Signed: 07/24/2020 12:51:47 PM By: [name] DO Entered By: [name] on 07/24/2020 12:49:26 -------------------------------------------------------------------------------- HxROS Details Patient Name: Date of Service: [name], [name] 07/21/2020 3:00 PM Medical Record Number: [id number] Patient Account Number: [id number] Date of Birth/Sex: Treating RN: 30-Nov-1985 (35 y.o. [name]: [name] Other Clinician: Referring Provider: Treating Provider/Extender: [name] in Treatment: 26 Information Obtained From Patient Eyes Medical History: Negative for: Cataracts; Glaucoma; Optic Neuritis Ear/Nose/Mouth/Throat Medical History: Negative for: Chronic sinus problems/congestion; Middle ear problems Hematologic/Lymphatic Medical History: Negative for: Anemia; Hemophilia; Human Immunodeficiency  Virus; Lymphedema; Sickle Cell Disease Respiratory Medical History: Negative for: Aspiration; Asthma; Chronic Obstructive Pulmonary Disease (COPD); Pneumothorax; Sleep Apnea; Tuberculosis Cardiovascular Medical History: Negative for: Angina; Arrhythmia; Congestive Heart Failure; Coronary Artery Disease; Deep Vein Thrombosis; Hypertension; Hypotension; Myocardial Infarction; Peripheral Arterial Disease; Peripheral Venous Disease; Phlebitis; Vasculitis Gastrointestinal Medical History: Negative for: Cirrhosis ; Colitis; Crohns; Hepatitis A; Hepatitis B; Hepatitis C Endocrine Medical History: Positive for: Type II Diabetes Negative for: Type I Diabetes Time with diabetes: 8 Treated with: Insulin Blood sugar tested every day: No Immunological Medical History: Negative for: Lupus Erythematosus; Raynauds; Scleroderma Integumentary (Skin) Medical History: Negative for: History of Burn Musculoskeletal Medical History: Negative for: Gout; Rheumatoid Arthritis; Osteoarthritis; Osteomyelitis Neurologic Medical History: Negative for: Dementia; Neuropathy; Quadriplegia; Paraplegia; Seizure Disorder Oncologic Medical History: Negative for: Received Chemotherapy; Received Radiation Psychiatric Medical History: Negative for: Anorexia/bulimia; Confinement Anxiety Immunizations  Pneumococcal Vaccine: Received Pneumococcal Vaccination: No Implantable Devices None Hospitalization / Surgery History Type of Hospitalization/Surgery 11/1-11/06/2018- sepsis foot infection 11/4-11/5 02 sats low respiratory distress Family and Social History Cancer: No; Diabetes: No; Hereditary Spherocytosis: No; Hypertension: No; Kidney Disease: No; Lung Disease: No; Seizures: No; Stroke: No; Thyroid Problems: No; Tuberculosis: No; Never smoker; Marital Status - Single; Alcohol Use: Rarely; Drug Use: No History; Caffeine Use: Never; Financial Concerns: No; Food, Clothing or Shelter Needs: No; Support System  Lacking: No; Transportation Concerns: No Electronic Signature(s) Signed: 07/24/2020 12:51:47 PM By: [name] DO Signed: 08/07/2020 3:14:58 PM By: Rhae Hammock RN Entered By: [name] on 07/24/2020 12:42:24 -------------------------------------------------------------------------------- Total Contact Cast Details Patient Name: Date of Service: [name], [name] 07/21/2020 3:00 PM Medical Record Number: [id number] Patient Account Number: [id number] Date of Birth/Sex: Treating RN: [birth year] (34 y.o. [name]: [name] Other Clinician: Referring Provider: Treating Provider/Extender: [name] in Treatment: 74 T Contact Cast Applied for Wound Assessment: otal Wound #3 Left,Lateral Foot Performed By: Physician [name], DO Post Procedure Diagnosis Same as Pre-procedure Electronic Signature(s) Signed: 07/24/2020 12:51:47 PM By: [name] DO Signed: 08/07/2020 3:14:58 PM By: Rhae Hammock RN Entered By: Rhae Hammock on 07/21/2020 16:37:49 -------------------------------------------------------------------------------- SuperBill Details Patient Name: Date of Service: [name], [name] 07/21/2020 Medical Record Number: [id number] Patient Account Number: [id number] Date of Birth/Sex: Treating RN: [birth year] (35 y.o. 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 L97.518 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ity Facility Procedures CPT4 Code: [id number] 1 Description: 1216 - DEB SUBQ TISSUE 20 SQ CM/< ICD-10 Diagnosis  Description L97.528 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i L97.518 Non-pressure chronic ulcer of other part of right foot with other specified  sever Modifier: ty ity Quantity: 1 CPT4 Code: [id number] 9 Description: 7597 - DEBRIDE WOUND 1ST 20 SQ CM OR < ICD-10 Diagnosis Description L97.528 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ity L97.518 Non-pressure chronic ulcer of other part of right foot with other  specified severity Modifier: Quantity: 1 Physician Procedures : CPT4 Code Description Modifier [id number] - WC PHYS SUBQ TISS 20 SQ CM ICD-10 Diagnosis Description L97.528 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ity L97.518 Non-pressure chronic ulcer of other part of right  foot with other specified severity Quantity: 1 : [id number] 42103 - WC PHYS DEBR WO ANESTH 20 SQ CM ICD-10 Diagnosis Description L97.528 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ity L97.518 Non-pressure chronic ulcer of other part of right foot with other specified  severity Quantity: 1 Electronic Signature(s) Signed: 07/24/2020 12:51:47 PM By: [name] DO Entered By: [name] on 07/24/2020 12:51:14

## 2020-08-07 NOTE — Progress Notes (Signed)
[place], [name] ([id number]) Visit Report for 07/21/2020 Arrival Information Details Patient Name: Date of Service: [name] 07/21/2020 3:00 PM Medical Record Number: [id number] Patient Account Number: [id number] Date of Birth/Sex: Treating RN: [birth year] (35 y.o. [name], [name] Primary Care Angelize Ryce: [name] Other Clinician: Referring Delaine Hernandez: Treating Jamani Bearce/Extender: [name] in Treatment: 67 Visit Information History Since Last Visit Added or deleted any medications: No Patient Arrived: Ambulatory Any new allergies or adverse reactions: No Arrival Time: 15:16 Had a fall or experienced change in No Accompanied By: self activities of daily living that may affect Transfer Assistance: None risk of falls: Patient Identification Verified: Yes Signs or symptoms of abuse/neglect since last visito No Secondary Verification Process Completed: Yes Hospitalized since last visit: No Patient Requires Transmission-Based Precautions: No Implantable device outside of the clinic excluding No Patient Has Alerts: No cellular tissue based products placed in the center since last visit: Has Dressing in Place as Prescribed: No Has Compression in Place as Prescribed: No Pain Present Now: No Electronic Signature(s) Signed: 07/21/2020 3:47:28 PM By: Sandre Kitty Entered By: Sandre Kitty on 07/21/2020 15:18:31 -------------------------------------------------------------------------------- Encounter Discharge Information Details Patient Name: Date of Service: [name], [name] 07/21/2020 3:00 PM Medical Record Number: [id number] Patient Account Number: [id number] Date of Birth/Sex: Treating RN: 24-Sep-1985 (35 y.o. [name] Primary Care Brettney Ficken: [name] Other Clinician: Referring Leylany Nored: Treating Valla Pacey/Extender: [name] in Treatment: 31 Encounter Discharge Information Items Post  Procedure Vitals Discharge Condition: Stable Temperature (F): 98.2 Ambulatory Status: Ambulatory Pulse (bpm): 94 Discharge Destination: Home Respiratory Rate (breaths/min): 18 Transportation: Private Auto Blood Pressure (mmHg): 145/91 Schedule Follow-up Appointment: Yes Clinical Summary of Care: Provided on 07/21/2020 Form Type Recipient Paper Patient Patient Electronic Signature(s) Signed: 07/21/2020 4:54:32 PM By: [name] Entered By: [name] on 07/21/2020 16:54:32 -------------------------------------------------------------------------------- Lower Extremity Assessment Details Patient Name: Date of Service: [name], [name] 07/21/2020 3:00 PM Medical Record Number: [id number] Patient Account Number: [id number] Date of Birth/Sex: Treating RN: [birth year] (35 y.o. [name] Primary Care Lucylle Foulkes: [name] Other Clinician: Referring Quantay Zaremba: Treating Ayen Viviano/Extender: [name] in Treatment: 38 Edema Assessment Assessed: [Left: No] [Right: No] Edema: [Left: Ye] [Right: s] Calf Left: Right: Point of Measurement: 48 cm From Medial Instep 53.2 cm 48.5 cm Ankle Left: Right: Point of Measurement: 10 cm From Medial Instep 31.6 cm 31.5 cm Vascular Assessment Pulses: Dorsalis Pedis Palpable: [Left:Yes] [Right:Yes] Electronic Signature(s) Signed: 07/22/2020 6:20:01 PM By: Baruch Gouty RN, BSN Entered By: Baruch Gouty on 07/21/2020 15:49:26 -------------------------------------------------------------------------------- Multi Wound Chart Details Patient Name: Date of Service: [name], [name] 07/21/2020 3:00 PM Medical Record Number: [id number] Patient Account Number: [id number] Date of Birth/Sex: Treating RN: March 30, 1986 (35 y.o. [name], [name] Primary Care Trevin Gartrell: [name] Other Clinician: Referring Nathanuel Cabreja: Treating Bexleigh Theriault/Extender: [name] in Treatment:  72 Vital Signs Height(in): 77 Capillary Blood Glucose(mg/dl): 149 Weight(lbs): 280 Pulse(bpm): 94 Body Mass Index(BMI): 33 Blood Pressure(mmHg): 145/91 Temperature(F): 98.2 Respiratory Rate(breaths/min): 18 Photos: [3:Left, Lateral Foot] [5:Right, Lateral Metatarsal head fifth Right, Anterior Ankle] Wound Location: [3:Trauma] [5:Gradually Appeared] [6:Gradually Appeared] Wounding Event: [3:Diabetic Wound/Ulcer of the Lower] [6:Diabetic Wound/Ulcer of the Lower] Primary Etiology: [3:Extremity Type II Diabetes] [5:Extremity Type II Diabetes] [6:Extremity Type II Diabetes] Comorbid History: [3:10/02/2019] [5:07/07/2020] [6:06/02/2020] Date Acquired: [3:38] [5:1] [6:1] Weeks of Treatment: [3:Open] [5:Open] [6:Open] Wound Status: [3:2.7x2.7x0.1] [5:2.5x3x0.5] [6:0.2x0.2x0.1] Measurements L x W x D (cm) [3:5.726] [5:5.89] [6:0.031] A (  cm) : rea [3:0.573] [5:2.945] [6:0.003] Volume (cm) : [3:-247.20%] [5:33.00%] [6:87.60%] % Reduction in A [3:rea: -247.30%] [5:33.00%] [6:94.00%] % Reduction in Volume: [5:4] Starting Position 1 (o'clock): [5:9] Ending Position 1 (o'clock): [5:0.9] Maximum Distance 1 (cm): [3:No] [5:Yes] [6:No] Undermining: [3:Grade 2] [5:Grade 2] [6:Grade 1] Classification: [3:Medium] [5:Medium] [6:Small] Exudate A mount: [3:Serosanguineous] [5:Serosanguineous] [6:Serosanguineous] Exudate Type: [3:red, brown] [5:red, brown] [6:red, brown] Exudate Color: [3:Flat and Intact] [5:Well defined, not attached] [6:Distinct, outline attached] Wound Margin: [3:Large (67-100%)] [5:Large (67-100%)] [6:None Present (0%)] Granulation A mount: [3:Red] [5:Red, Pink] [6:N/A] Granulation Quality: [3:None Present (0%)] [5:Small (1-33%)] [6:None Present (0%)] Necrotic A mount: [3:Fat Layer (Subcutaneous Tissue): Yes Fat Layer (Subcutaneous Tissue): Yes Fascia: No] Exposed Structures: [3:Fascia: No Tendon: No Muscle: No Joint: No Bone: No Small (1-33%)] [5:Fascia: No Tendon: No Muscle: No  Joint: No Bone: No Small (1-33%)] [6:Fat Layer (Subcutaneous Tissue): No Tendon: No Muscle: No Joint: No Bone: No Large (67-100%)] Epithelialization: [3:Debridement - Excisional] [5:Debridement - Selective/Open Wound N/A] Debridement: Pre-procedure Verification/Time Out 16:09 [5:16:08] [6:N/A] Taken: [3:Lidocaine] [5:Lidocaine] [6:N/A] Pain Control: [3:Callus, Subcutaneous, Slough] [5:Callus] [6:N/A] Tissue Debrided: [3:Skin/Subcutaneous Tissue] [5:Non-Viable Tissue] [6:N/A] Level: [3:7.29] [5:2.55] [6:N/A] Debridement A (sq cm): [3:rea Blade, Curette, Forceps] [5:Blade, Forceps] [6:N/A] Instrument: [3:Minimum] [5:Minimum] [6:N/A] Bleeding: [3:Silver Nitrate] [5:Pressure] [6:N/A] Hemostasis A chieved: [3:0] [5:0] [6:N/A] Procedural Pain: [3:0] [5:0] [6:N/A] Post Procedural Pain: [3:Procedure was tolerated well] [5:Procedure was tolerated well] [6:N/A] Debridement Treatment Response: [3:2.7x2.7x0.1] [5:2.5x3x0.5] [6:N/A] Post Debridement Measurements L x W x D (cm) [3:0.573] [5:2.945] [6:N/A] Post Debridement Volume: (cm) [3:N/A] [5:N/A] [6:scabbed] Assessment Notes: [3:Debridement] [5:Debridement] [6:N/A] Procedures Performed: [3:T Contact Cast otal] Treatment Notes Wound #3 (Foot) Wound Laterality: Left, Lateral Cleanser Soap and Water Discharge Instruction: May shower and wash wound with dial antibacterial soap and water prior to dressing change. Peri-Wound Care Zinc Oxide Ointment 30g tube Discharge Instruction: Apply Zinc Oxide to periwound with each dressing change as needed. Topical Primary Dressing Hydrofera Blue Classic Foam, 4x4 in Discharge Instruction: Moisten with saline prior to applying to wound bed Secondary Dressing Woven Gauze Sponge, Non-Sterile 4x4 in Discharge Instruction: Apply over primary dressing as directed. ABD Pad, 8x10 Discharge Instruction: T down before applying kerlix. Apply over primary dressing as directed. ape Zetuvit Plus 4x8 in Discharge  Instruction: Apply over primary dressing as directed. Secured With [employer], 4.5x3.1 (in/yd) Discharge Instruction: Secure with Kerlix as directed. Compression Wrap Compression Stockings Add-Ons Wound #5 (Metatarsal head fifth) Wound Laterality: Right, Lateral Cleanser Soap and Water Discharge Instruction: May shower and wash wound with dial antibacterial soap and water prior to dressing change. Peri-Wound Care Topical Primary Dressing KerraCel Ag Gelling Fiber Dressing, 4x5 in (silver alginate) Discharge Instruction: Apply silver alginate to wound bed as instructed Secondary Dressing ABD Pad, 8x10 Discharge Instruction: Apply over primary dressing as directed. Optifoam Non-Adhesive Dressing, 4x4 in Discharge Instruction: Apply over primary dressing as directed. Secured With [employer], 4.5x3.1 (in/yd) Discharge Instruction: Secure with Kerlix as directed. 70M Medipore H Soft Cloth Surgical T 4 x 2 (in/yd) ape Discharge Instruction: Secure dressing with tape as directed. Compression Wrap Compression Stockings Add-Ons Wound #6 (Ankle) Wound Laterality: Right, Anterior Cleanser Soap and Water Discharge Instruction: May shower and wash wound with dial antibacterial soap and water prior to dressing change. Peri-Wound Care Topical Primary Dressing KerraCel Ag Gelling Fiber Dressing, 4x5 in (silver alginate) Discharge Instruction: Apply silver alginate to wound bed as instructed Secondary Dressing ABD Pad, 8x10 Discharge Instruction: Apply over primary dressing as directed. Optifoam Non-Adhesive  Dressing, 4x4 in Discharge Instruction: Apply over primary dressing as directed. Secured With [employer], 4.5x3.1 (in/yd) Discharge Instruction: Secure with Kerlix as directed. 72M Medipore H Soft Cloth Surgical T 4 x 2 (in/yd) ape Discharge Instruction: Secure dressing with tape as directed. Compression Wrap Compression Stockings Add-Ons Electronic  Signature(s) Signed: 07/24/2020 12:51:47 PM By: [name] DO Signed: 08/07/2020 3:14:58 PM By: Rhae Hammock RN Entered By: [name] on 07/24/2020 12:40:15 -------------------------------------------------------------------------------- Multi-Disciplinary Care Plan Details Patient Name: Date of Service: [name], [name] 07/21/2020 3:00 PM Medical Record Number: [id number] Patient Account Number: [id number] Date of Birth/Sex: Treating RN: [birth year] (35 y.o. [name], [name] Primary Care Shandon Burlingame: [name] Other Clinician: Referring Renad Jenniges: Treating Kannan Proia/Extender: [name] in Treatment: 21 Multidisciplinary Care Plan reviewed with physician Active Inactive Nutrition Nursing Diagnoses: Imbalanced nutrition Potential for alteratiion in Nutrition/Potential for imbalanced nutrition Goals: Patient/caregiver agrees to and verbalizes understanding of need to use nutritional supplements and/or vitamins as prescribed Date Initiated: 10/24/2019 Date Inactivated: 04/06/2020 Target Resolution Date: 04/03/2020 Goal Status: Met Patient/caregiver will maintain therapeutic glucose control Date Initiated: 10/24/2019 Target Resolution Date: 08/18/2020 Goal Status: Active Interventions: Assess HgA1c results as ordered upon admission and as needed Assess patient nutrition upon admission and as needed per policy Provide education on elevated blood sugars and impact on wound healing Provide education on nutrition Treatment Activities: Education provided on Nutrition : 06/18/2020 Notes: Electronic Signature(s) Signed: 08/07/2020 3:14:58 PM By: Rhae Hammock RN Entered By: Rhae Hammock on 07/21/2020 16:37:58 -------------------------------------------------------------------------------- Pain Assessment Details Patient Name: Date of Service: [name], [name] 07/21/2020 3:00 PM Medical Record Number: [id number] Patient Account  Number: [id number] Date of Birth/Sex: Treating RN: Oct 17, 1985 (35 y.o. [name] Primary Care Jolyne Laye: [name] Other Clinician: Referring Alleah Dearman: Treating Carrson Lightcap/Extender: [name] in Treatment: 38 Active Problems Location of Pain Severity and Description of Pain Patient Has Paino No Site Locations Rate the pain. Current Pain Level: 0 Pain Management and Medication Current Pain Management: Electronic Signature(s) Signed: 07/22/2020 6:20:01 PM By: Baruch Gouty RN, BSN Signed: 08/07/2020 3:14:58 PM By: Rhae Hammock RN Entered By: Baruch Gouty on 07/21/2020 15:47:05 -------------------------------------------------------------------------------- Patient/Caregiver Education Details Patient Name: Date of Service: [name] 4/19/2022andnbsp3:00 PM Medical Record Number: [id number] Patient Account Number: [id number] Date of Birth/Gender: Treating RN: 1985-11-25 (35 y.o. [name] Primary Care Physician: [name] Other Clinician: Referring Physician: Treating Physician/Extender: [name] in Treatment: 21 Education Assessment Education Provided To: Patient Education Topics Provided Elevated Blood Sugar/ Impact on Healing: Methods: Explain/Verbal Responses: State content correctly Nutrition: Methods: Explain/Verbal Responses: State content correctly Electronic Signature(s) Signed: 08/07/2020 3:14:58 PM By: Rhae Hammock RN Entered By: Rhae Hammock on 07/21/2020 16:38:13 -------------------------------------------------------------------------------- Wound Assessment Details Patient Name: Date of Service: [name], [name] 07/21/2020 3:00 PM Medical Record Number: [id number] Patient Account Number: [id number] Date of Birth/Sex: Treating RN: November 05, 1985 (35 y.o. [name], [name] Primary Care Kateline Kinkade: [name] Other Clinician: Referring  Latravion Graves: Treating Sheletha Bow/Extender: [name] in Treatment: 38 Wound Status Wound Number: 3 Primary Etiology: Diabetic Wound/Ulcer of the Lower Extremity Wound Location: Left, Lateral Foot Wound Status: Open Wounding Event: Trauma Comorbid History: Type II Diabetes Date Acquired: 10/02/2019 Weeks Of Treatment: 38 Clustered Wound: No Photos Wound Measurements Length: (cm) 2.7 Width: (cm) 2.7 Depth: (cm) 0.1 Area: (cm) 5.726 Volume: (cm) 0.573 % Reduction in Area: -247.2% % Reduction in Volume: -247.3% Epithelialization: Small (1-33%) Tunneling: No  Undermining: No Wound Description Classification: Grade 2 Wound Margin: Flat and Intact Exudate Amount: Medium Exudate Type: Serosanguineous Exudate Color: red, brown Foul Odor After Cleansing: No Slough/Fibrino No Wound Bed Granulation Amount: Large (67-100%) Exposed Structure Granulation Quality: Red Fascia Exposed: No Necrotic Amount: None Present (0%) Fat Layer (Subcutaneous Tissue) Exposed: Yes Tendon Exposed: No Muscle Exposed: No Joint Exposed: No Bone Exposed: No Electronic Signature(s) Signed: 07/22/2020 10:33:47 AM By: Sandre Kitty Signed: 08/07/2020 3:14:58 PM By: Rhae Hammock RN Previous Signature: 07/21/2020 3:47:28 PM Version By: Sandre Kitty Entered By: Sandre Kitty on 07/21/2020 16:51:47 -------------------------------------------------------------------------------- Wound Assessment Details Patient Name: Date of Service: [name], [name] 07/21/2020 3:00 PM Medical Record Number: [id number] Patient Account Number: [id number] Date of Birth/Sex: Treating RN: 1985/10/01 (35 y.o. [name], [name] Primary Care Zuley Lutter: [name] Other Clinician: Referring Ruairi Stutsman: Treating Laine Fonner/Extender: [name] in Treatment: 38 Wound Status Wound Number: 5 Primary Etiology: Diabetic Wound/Ulcer of the Lower Extremity Wound  Location: Right, Lateral Metatarsal head fifth Wound Status: Open Wounding Event: Gradually Appeared Comorbid History: Type II Diabetes Date Acquired: 07/07/2020 Weeks Of Treatment: 1 Clustered Wound: No Photos Wound Measurements Length: (cm) 2.5 Width: (cm) 3 Depth: (cm) 0.5 Area: (cm) 5.89 Volume: (cm) 2.945 % Reduction in Area: 33% % Reduction in Volume: 33% Epithelialization: Small (1-33%) Tunneling: No Undermining: Yes Starting Position (o'clock): 4 Ending Position (o'clock): 9 Maximum Distance: (cm) 0.9 Wound Description Classification: Grade 2 Wound Margin: Well defined, not attached Exudate Amount: Medium Exudate Type: Serosanguineous Exudate Color: red, brown Foul Odor After Cleansing: No Slough/Fibrino No Wound Bed Granulation Amount: Large (67-100%) Exposed Structure Granulation Quality: Red, Pink Fascia Exposed: No Necrotic Amount: Small (1-33%) Fat Layer (Subcutaneous Tissue) Exposed: Yes Necrotic Quality: Adherent Slough Tendon Exposed: No Muscle Exposed: No Joint Exposed: No Bone Exposed: No Electronic Signature(s) Signed: 07/22/2020 10:33:47 AM By: Sandre Kitty Signed: 08/07/2020 3:14:58 PM By: Rhae Hammock RN Previous Signature: 07/21/2020 3:47:28 PM Version By: Sandre Kitty Entered By: Sandre Kitty on 07/21/2020 16:54:00 -------------------------------------------------------------------------------- Wound Assessment Details Patient Name: Date of Service: [name], [name] 07/21/2020 3:00 PM Medical Record Number: [id number] Patient Account Number: [id number] Date of Birth/Sex: Treating RN: 03-Nov-1985 (35 y.o. [name], [name] Primary Care Sevyn Paredez: [name] Other Clinician: Referring Dacia Capers: Treating Daeron Carreno/Extender: [name] in Treatment: 38 Wound Status Wound Number: 6 Primary Etiology: Diabetic Wound/Ulcer of the Lower Extremity Wound Location: Right, Anterior Ankle Wound  Status: Open Wounding Event: Gradually Appeared Comorbid History: Type II Diabetes Date Acquired: 06/02/2020 Weeks Of Treatment: 1 Clustered Wound: No Photos Wound Measurements Length: (cm) 0.2 Width: (cm) 0.2 Depth: (cm) 0.1 Area: (cm) 0.031 Volume: (cm) 0.003 % Reduction in Area: 87.6% % Reduction in Volume: 94% Epithelialization: Large (67-100%) Tunneling: No Undermining: No Wound Description Classification: Grade 1 Wound Margin: Distinct, outline attached Exudate Amount: Small Exudate Type: Serosanguineous Exudate Color: red, brown Foul Odor After Cleansing: No Slough/Fibrino No Wound Bed Granulation Amount: None Present (0%) Exposed Structure Necrotic Amount: None Present (0%) Fascia Exposed: No Fat Layer (Subcutaneous Tissue) Exposed: No Tendon Exposed: No Muscle Exposed: No Joint Exposed: No Bone Exposed: No Assessment Notes scabbed Electronic Signature(s) Signed: 07/22/2020 10:33:47 AM By: Sandre Kitty Signed: 08/07/2020 3:14:58 PM By: Rhae Hammock RN Previous Signature: 07/21/2020 3:47:28 PM Version By: Sandre Kitty Entered By: Sandre Kitty on 07/21/2020 16:51:22 -------------------------------------------------------------------------------- [place] Details Patient Name: Date of Service: [name], [name] 07/21/2020 3:00 PM Medical Record Number: [id number] Patient Account Number: [id number] Date  of Birth/Sex: Treating RN: February 22, 1986 (35 y.o. [name], [name] Primary Care Chauntelle Azpeitia: [name] Other Clinician: Referring Mamie Hundertmark: Treating Bhavin Monjaraz/Extender: [name] in Treatment: 43 Vital Signs Time Taken: 15:18 Temperature (F): 98.2 Height (in): 77 Pulse (bpm): 94 Weight (lbs): 280 Respiratory Rate (breaths/min): 18 Body Mass Index (BMI): 33.2 Blood Pressure (mmHg): 145/91 Capillary Blood Glucose (mg/dl): 149 Reference Range: 80 - 120 mg / dl Electronic Signature(s) Signed: 07/21/2020  3:47:28 PM By: Sandre Kitty Entered By: Sandre Kitty on 07/21/2020 15:19:14

## 2020-08-10 ENCOUNTER — Other Ambulatory Visit: Payer: Self-pay

## 2020-08-10 ENCOUNTER — Encounter (HOSPITAL_BASED_OUTPATIENT_CLINIC_OR_DEPARTMENT_OTHER): Payer: BC Managed Care – PPO | Admitting: Internal Medicine

## 2020-08-10 DIAGNOSIS — E11621 Type 2 diabetes mellitus with foot ulcer: Secondary | ICD-10-CM | POA: Diagnosis not present

## 2020-08-10 NOTE — Progress Notes (Addendum)
[name], Max ([id number]) Visit Report for 08/10/2020 Arrival Information Details Patient Name: Date of Service: [name] 08/10/2020 1:30 PM Medical Record Number: [id number] Patient Account Number: [id number] Date of Birth/Sex: Treating RN: 05-Dec-1985 (35 y.o. [name] Primary Care [name]: [name] Other Clinician: Referring [name]: Treating [name] Date/Extender: [name] in Treatment: 90 Visit Information History Since Last Visit Added or deleted any medications: No Patient Arrived: Ambulatory Any new allergies or adverse reactions: No Arrival Time: 13:42 Had a fall or experienced change in No Transfer Assistance: None activities of daily living that may affect Patient Identification Verified: Yes risk of falls: Secondary Verification Process Completed: Yes Signs or symptoms of abuse/neglect since No Patient Requires Transmission-Based Precautions: No last visito Patient Has Alerts: No Hospitalized since last visit: No Implantable device outside of the clinic No excluding cellular tissue based products placed in the center since last visit: Has Dressing in Place as Prescribed: Yes Has Footwear/Offloading in Place as Yes Prescribed: Left: T Contact Cast otal Right: Surgical Shoe with Pressure Relief Insole Pain Present Now: No Electronic Signature(s) Signed: 08/10/2020 5:46:23 PM By: [name] Entered By: [name] on 08/10/2020 13:44:08 -------------------------------------------------------------------------------- Encounter Discharge Information Details Patient Name: Date of Service: [name], [name] 08/10/2020 1:30 PM Medical Record Number: [id number] Patient Account Number: [id number] Date of Birth/Sex: Treating RN: 06-26-1985 (35 y.o. [name] Primary Care [name]: [name] Other Clinician: Referring [name]: Treating [name]/Extender: [name] in  Treatment: 9 Encounter Discharge Information Items Discharge Condition: Stable Ambulatory Status: Ambulatory Discharge Destination: Home Transportation: Private Auto Schedule Follow-up Appointment: Yes Clinical Summary of Care: Provided on 08/10/2020 Form Type Recipient Paper Patient Patient Electronic Signature(s) Signed: 08/10/2020 5:07:09 PM By: [name] Signed: 08/10/2020 5:07:09 PM By: [name] Entered By: [name] on 08/10/2020 17:07:09 -------------------------------------------------------------------------------- Lower Extremity Assessment Details Patient Name: Date of Service: [name], [name] 08/10/2020 1:30 PM Medical Record Number: [id number] Patient Account Number: [id number] Date of Birth/Sex: Treating RN: 1986/02/26 (35 y.o. [name] Primary Care [name]: [name] Other Clinician: Referring [name]: Treating [name]/Extender: [name] in Treatment: 41 Edema Assessment Assessed: [name]: Yes] [name]: Yes] Edema: [Left: Yes] [Right: Yes] Calf Left: Right: Point of Measurement: 48 cm From Medial Instep 50.5 cm 47.2 cm Ankle Left: Right: Point of Measurement: 10 cm From Medial Instep 31.8 cm 29 cm Vascular Assessment Pulses: Dorsalis Pedis Palpable: [Left:Yes] [Right:Yes] Electronic Signature(s) Signed: 08/10/2020 5:46:23 PM By: [name] Entered By: [name] on 08/10/2020 14:07:15 -------------------------------------------------------------------------------- Multi Wound Chart Details Patient Name: Date of Service: [name], [name] 08/10/2020 1:30 PM Medical Record Number: [id number] Patient Account Number: [id number] Date of Birth/Sex: Treating RN: [birth year] (35 y.o. [name] Primary Care [name]: [name] Other Clinician: Referring [name]: Treating [name]/Extender: [name] in Treatment: 41 Vital Signs Height(in): 77 Capillary  Blood Glucose(mg/dl): 96 Weight(lbs): 280 Pulse(bpm): 68 Body Mass Index(BMI): 33 Blood Pressure(mmHg): 143/89 Temperature(F): 97.5 Respiratory Rate(breaths/min): 18 Photos: [3:No Photos Left, Lateral Foot] [5:No Photos Right, Lateral Metatarsal head fifth] [N/A:N/A N/A] Wound Location: [3:Trauma] [5:Gradually Appeared] [N/A:N/A] Wounding Event: [3:Diabetic Wound/Ulcer of the Lower] [5:Diabetic Wound/Ulcer of the Lower] [N/A:N/A] Primary Etiology: [3:Extremity Type II Diabetes] [5:Extremity Type II Diabetes] [N/A:N/A] Comorbid History: [3:10/02/2019] [5:07/07/2020] [N/A:N/A] Date Acquired: [3:41] [5:3] [N/A:N/A] Weeks of Treatment: [3:Open] [5:Open] [N/A:N/A] Wound Status: [3:2.5x1.9x0.5] [5:2.9x3.3x0.4] [N/A:N/A] Measurements L x W x [name] [3:3.731] [5:7.516] [N/A:N/A] A (cm) : rea [  3:1.865] [5:3.007] [N/A:N/A] Volume (cm) : [3:-126.30%] [5:14.60%] [N/A:N/A] % Reduction in A rea: [3:-1030.30%] [5:31.60%] [N/A:N/A] % Reduction in Volume: [5:3] Starting Position 1 (o'clock): [5:7] Ending Position 1 (o'clock): [5:1.2] Maximum Distance 1 (cm): [3:No] [5:Yes] [N/A:N/A] Undermining: [3:Grade 2] [5:Grade 2] [N/A:N/A] Classification: [3:Medium] [5:Medium] [N/A:N/A] Exudate A mount: [3:Serosanguineous] [5:Serosanguineous] [N/A:N/A] Exudate Type: [3:red, brown] [5:red, brown] [N/A:N/A] Exudate Color: [3:Well defined, not attached] [5:Well defined, not attached] [N/A:N/A] Wound Margin: [3:Large (67-100%)] [5:Large (67-100%)] [N/A:N/A] Granulation A mount: [3:Red] [5:Red, Pink] [N/A:N/A] Granulation Quality: [3:None Present (0%)] [5:None Present (0%)] [N/A:N/A] Necrotic A mount: [3:Fat Layer (Subcutaneous Tissue): Yes Fat Layer (Subcutaneous Tissue): Yes N/A] Exposed Structures: [3:Fascia: No Tendon: No Muscle: No Joint: No Bone: No Small (1-33%)] [5:Fascia: No Tendon: No Muscle: No Joint: No Bone: No Small (1-33%)] [N/A:N/A] Epithelialization: [3:calloused periwound] [5:calloused  periwound] [N/A:N/A] Assessment Notes: [3:T Contact Cast otal] [5:Chemical Cauterization] [N/A:N/A] Treatment Notes Electronic Signature(s) Signed: 08/10/2020 4:39:47 PM By: Max Ham MD Signed: 08/10/2020 5:39:20 PM By: Max Hurst RN, BSN Entered By: [name] on 08/10/2020 14:55:06 -------------------------------------------------------------------------------- Multi-Disciplinary Care Plan Details Patient Name: Date of Service: [name], [name] 08/10/2020 1:30 PM Medical Record Number: [id number] Patient Account Number: [id number] Date of Birth/Sex: Treating RN: [birth year] (35 y.o. [name] Primary Care Josearmando Kuhnert: [name] Other Clinician: Referring Julieanne Hadsall: Treating Jimmie Rueter/Extender: [name] in Treatment: 63 Multidisciplinary Care Plan reviewed with physician Active Inactive Nutrition Nursing Diagnoses: Imbalanced nutrition Potential for alteratiion in Nutrition/Potential for imbalanced nutrition Goals: Patient/caregiver agrees to and verbalizes understanding of need to use nutritional supplements and/or vitamins as prescribed Date Initiated: 10/24/2019 Date Inactivated: 04/06/2020 Target Resolution Date: 04/03/2020 Goal Status: Met Patient/caregiver will maintain therapeutic glucose control Date Initiated: 10/24/2019 Target Resolution Date: 08/18/2020 Goal Status: Active Interventions: Assess HgA1c results as ordered upon admission and as needed Assess patient nutrition upon admission and as needed per policy Provide education on elevated blood sugars and impact on wound healing Provide education on nutrition Treatment Activities: Education provided on Nutrition : 07/21/2020 Notes: Wound/Skin Impairment Nursing Diagnoses: Impaired tissue integrity Knowledge deficit related to ulceration/compromised skin integrity Goals: Patient/caregiver will verbalize understanding of skin care regimen Date Initiated:  10/24/2019 Target Resolution Date: 08/24/2020 Goal Status: Active Ulcer/skin breakdown will have a volume reduction of 30% by week 4 Date Initiated: 10/24/2019 Date Inactivated: 01/16/2020 Target Resolution Date: 01/10/2020 Unmet Reason: no change in Goal Status: Unmet measurements. Interventions: Assess patient/caregiver ability to obtain necessary supplies Assess patient/caregiver ability to perform ulcer/skin care regimen upon admission and as needed Assess ulceration(s) every visit Provide education on ulcer and skin care Notes: Electronic Signature(s) Signed: 08/10/2020 5:39:20 PM By: Max Hurst RN, BSN Entered By: [name] on 08/10/2020 14:35:42 -------------------------------------------------------------------------------- Pain Assessment Details Patient Name: Date of Service: [name], [name] 08/10/2020 1:30 PM Medical Record Number: [id number] Patient Account Number: [id number] Date of Birth/Sex: Treating RN: 1986-03-11 (35 y.o. [name] Primary Care Ankur Snowdon: [name] Other Clinician: Referring Karam Dunson: Treating Gurley Climer/Extender: [name] in Treatment: 41 Active Problems Location of Pain Severity and Description of Pain Patient Has Paino No Site Locations Pain Management and Medication Current Pain Management: Electronic Signature(s) Signed: 08/10/2020 5:46:23 PM By: [name] Entered By: [name] on 08/10/2020 13:46:26 -------------------------------------------------------------------------------- Patient/Caregiver Education Details Patient Name: Date of Service: [name] 5/9/2022andnbsp1:30 PM Medical Record Number: [id number] Patient Account Number: [id number] Date of Birth/Gender: Treating RN: [birth year] (35 y.o. [name] Primary Care Physician: [name] Other  Clinician: Referring Physician: Treating Physician/Extender: [name] in  Treatment: 32 Education Assessment Education Provided To: Patient Education Topics Provided Wound/Skin Impairment: Methods: Explain/Verbal Responses: State content correctly [employer]) Signed: 08/10/2020 5:39:20 PM By: Max Hurst RN, BSN Entered By: [name] on 08/10/2020 14:36:20 -------------------------------------------------------------------------------- Wound Assessment Details Patient Name: Date of Service: [name], [name] 08/10/2020 1:30 PM Medical Record Number: [id number] Patient Account Number: [id number] Date of Birth/Sex: Treating RN: 1985/08/11 (34 y.o. [name] Primary Care Allyanna Appleman: [name] Other Clinician: Referring Bethani Brugger: Treating Abenezer Odonell/Extender: [name] in Treatment: 41 Wound Status Wound Number: 3 Primary Etiology: Diabetic Wound/Ulcer of the Lower Extremity Wound Location: Left, Lateral Foot Wound Status: Open Wounding Event: Trauma Comorbid History: Type II Diabetes Date Acquired: 10/02/2019 Weeks Of Treatment: 41 Clustered Wound: No Photos Wound Measurements Length: (cm) 2.5 Width: (cm) 1.9 Depth: (cm) 0.5 Area: (cm) 3.731 Volume: (cm) 1.865 % Reduction in Area: -126.3% % Reduction in Volume: -1030.3% Epithelialization: Small (1-33%) Tunneling: No Undermining: No Wound Description Classification: Grade 2 Wound Margin: Well defined, not attached Exudate Amount: Medium Exudate Type: Serosanguineous Exudate Color: red, brown Foul Odor After Cleansing: No Slough/Fibrino No Wound Bed Granulation Amount: Large (67-100%) Exposed Structure Granulation Quality: Red Fascia Exposed: No Necrotic Amount: None Present (0%) Fat Layer (Subcutaneous Tissue) Exposed: Yes Tendon Exposed: No Muscle Exposed: No Joint Exposed: No Bone Exposed: No Assessment Notes calloused periwound Treatment Notes Wound #3 (Foot) Wound Laterality: Left, Lateral Cleanser Soap and  Water Discharge Instruction: May shower and wash wound with dial antibacterial soap and water prior to dressing change. Peri-Wound Care Zinc Oxide Ointment 30g tube Discharge Instruction: Apply Zinc Oxide to periwound with each dressing change as needed. Topical Primary Dressing Hydrofera Blue Classic Foam, 2x2 in Discharge Instruction: Moisten with saline prior to applying to wound bed Secondary Dressing Woven Gauze Sponge, Non-Sterile 4x4 in Discharge Instruction: Apply over primary dressing as directed. Zetuvit Plus 4x8 in Discharge Instruction: Apply over primary dressing as directed. ABD Pad, 8x10 Discharge Instruction: T down before applying kerlix. Apply over primary dressing as directed. ape Secured With [employer], 4.5x3.1 (in/yd) Discharge Instruction: Secure with Kerlix as directed. Compression Wrap Compression Stockings Add-Ons Electronic Signature(s) Signed: 08/11/2020 5:40:35 PM By: [name] Signed: 08/12/2020 9:46:23 AM By: Sandre Kitty Previous Signature: 08/10/2020 5:46:23 PM Version By: [name] Entered By: Sandre Kitty on 08/11/2020 15:55:30 -------------------------------------------------------------------------------- Wound Assessment Details Patient Name: Date of Service: [name], [name] 08/10/2020 1:30 PM Medical Record Number: [id number] Patient Account Number: [id number] Date of Birth/Sex: Treating RN: 12-27-1985 (35 y.o. [name] Primary Care Samel Bruna: [name] Other Clinician: Referring Praneel Haisley: Treating Jaylanni Eltringham/Extender: [name] in Treatment: 41 Wound Status Wound Number: 5 Primary Etiology: Diabetic Wound/Ulcer of the Lower Extremity Wound Location: Right, Lateral Metatarsal head fifth Wound Status: Open Wounding Event: Gradually Appeared Comorbid History: Type II Diabetes Date Acquired: 07/07/2020 Weeks Of Treatment: 3 Clustered Wound: No Photos Wound  Measurements Length: (cm) 2.9 Width: (cm) 3.3 Depth: (cm) 0.4 Area: (cm) 7.516 Volume: (cm) 3.007 % Reduction in Area: 14.6% % Reduction in Volume: 31.6% Epithelialization: Small (1-33%) Tunneling: No Undermining: Yes Starting Position (o'clock): 3 Ending Position (o'clock): 7 Maximum Distance: (cm) 1.2 Wound Description Classification: Grade 2 Wound Margin: Well defined, not attached Exudate Amount: Medium Exudate Type: Serosanguineous Exudate Color: red, brown Foul Odor After Cleansing: No Slough/Fibrino No Wound Bed Granulation Amount: Large (67-100%) Exposed Structure Granulation Quality: Red, Pink Fascia  Exposed: No Necrotic Amount: None Present (0%) Fat Layer (Subcutaneous Tissue) Exposed: Yes Tendon Exposed: No Muscle Exposed: No Joint Exposed: No Bone Exposed: No Assessment Notes calloused periwound Treatment Notes Wound #5 (Metatarsal head fifth) Wound Laterality: Right, Lateral Cleanser Normal Saline Discharge Instruction: Cleanse the wound with Normal Saline prior to applying a clean dressing using gauze sponges, not tissue or cotton balls. Soap and Water Discharge Instruction: May shower and wash wound with dial antibacterial soap and water prior to dressing change. Peri-Wound Care Topical Primary Dressing Hydrofera Blue Classic Foam, 4x4 in Discharge Instruction: Moisten with saline prior to applying to wound bed Secondary Dressing ABD Pad, 8x10 Discharge Instruction: Apply over primary dressing as directed. Optifoam Non-Adhesive Dressing, 4x4 in Discharge Instruction: Apply over primary dressing, cut to make foam donut to offload Secured With [employer] Sterile, 4.5x3.1 (in/yd) Discharge Instruction: Secure with Kerlix as directed. 30M Medipore H Soft Cloth Surgical T ape, 2x2 (in/yd) Discharge Instruction: Secure dressing with tape as directed. Compression Wrap Compression Stockings Add-Ons Electronic Signature(s) Signed: 08/11/2020 5:40:35  PM By: [name] Signed: 08/12/2020 9:46:23 AM By: Sandre Kitty Previous Signature: 08/10/2020 5:46:23 PM Version By: [name] Entered By: Sandre Kitty on 08/11/2020 15:54:54 -------------------------------------------------------------------------------- Vitals Details Patient Name: Date of Service: [name], [name] 08/10/2020 1:30 PM Medical Record Number: [id number] Patient Account Number: [id number] Date of Birth/Sex: Treating RN: 30-Sep-1985 (35 y.o. [name] Primary Care Leya Paige: [name] Other Clinician: Referring Kelin Nixon: Treating Khrystian Schauf/Extender: [name] in Treatment: 41 Vital Signs Time Taken: 13:45 Temperature (F): 97.5 Height (in): 77 Pulse (bpm): 86 Weight (lbs): 280 Respiratory Rate (breaths/min): 18 Body Mass Index (BMI): 33.2 Blood Pressure (mmHg): 143/89 Capillary Blood Glucose (mg/dl): 77 Reference Range: 80 - 120 mg / dl Electronic Signature(s) Signed: 08/10/2020 5:46:23 PM By: [name] Entered By: [name] on 08/10/2020 13:46:20

## 2020-08-10 NOTE — Progress Notes (Signed)
[name], [name] ([id number]) Visit Report for 08/10/2020 HPI Details Patient Name: Date of Service: [name] D 08/10/2020 1:30 PM Medical Record Number: [id number] Patient Account Number: [id number] Date of Birth/Sex: Treating RN: July 14, 1985 (35 y.o. 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 He comes in for review of a wound over the plantar fifth metatarsal head extending into the lateral part of the foot. He was followed for this previously by his podiatrist Dr. Cornelius Moras. As the patient tells his story he went to see podiatry first for a swelling he developed on the lateral part of his fifth metatarsal head in May. He states this was "open" by podiatry and the area closed. He was followed up in June and it was again opened callus removed and it closed promptly. There were plans being made for surgery on the fifth metatarsal head in June however his blood sugar was apparently too high for anesthesia. Apparently the area was debrided and opened again in June and it is never closed since. Looking over the records from podiatry I am really not able to follow this. It was clear when he was first seen it was before 5/14 at that point he already had a wound. By 5/17 the ulcer was resolved. I do not see anything about a procedure. On 5/28 noted to have pre-ulcerative moderate keratosis. X-ray noted 1/5 contracted toe and tailor's bunion and metatarsal deformity. On a visit date on 09/28/2018 the dorsal part of the left foot it healed and resolved. There was concern about swelling in his lower extremity he was sent to the ER.. As far as I can tell he was seen in the ER on 7/12 with an ulcer on his left foot. A DVT rule out  of the left leg was negative. I do not think I have complete records from podiatry but I am not able to verify the procedures this patient states he had. He states after the last procedure the wound has never closed although I am not able to follow this in the records I have from podiatry. He has not had a recent x-ray The patient has been using Neosporin on the wound. He is wearing a Darco shoe. He is still very active up on his foot working and exercising. Past medical history; type 2 diabetes ketosis-prone, leg swelling with a negative DVT study in July. Non-smoker ABI in our clinic was 0.85 on the left 34/16; substantial wound on the plantar left fifth met head extending laterally almost to the dorsal fifth MTP. We have been using silver alginate we gave him a Darco forefoot off loader. An x-ray did not show evidence of osteomyelitis did note soft tissue emphysema which I think was due to gas tracking through an open wound. There is no doubt in my mind he requires an MRI 10/23; MRI not booked until 3 November at the earliest this is largely due to his glucose sensor in the right arm. We have been using silver alginate. There has been an improvement 10/29; I am still not exactly sure when his MRI is booked for. He says it is the third but it is the 10th in epic. This definitely needs to be done. He is running a low-grade fever today but no other symptoms. No real improvement  in the 1 02/26/2019 patient presents today for a follow-up visit here in our clinic he is last been seen in the clinic on October 29. Subsequently we were working on getting MRI to evaluate and see what exactly was going on and where we would need to go from the standpoint of whether or not he had osteomyelitis and again what treatments were going be required. Subsequently the patient ended up being admitted to the hospital on 02/07/2019 and was discharged on 02/14/2019. This is a somewhat interesting admission with a discharge  diagnosis of pneumonia due to COVID-19 although he was positive for COVID-19 when tested at the urgent care but negative x2 when he was actually in the hospital. With that being said he did have acute respiratory failure with hypoxia and it was noted he also have a left foot ulceration with osteomyelitis. With that being said he did require oxygen for his pneumonia and I level 4 L. He was placed on antivirals and steroids for the COVID-19. He was also transferred to the [facility] at one point. Nonetheless he did subsequently discharged home and since being home has done much better in that regard. The CT angiogram did not show any pulmonary embolism. With regard to the osteomyelitis the patient was placed on vancomycin and Zosyn while in the hospital but has been changed to Augmentin at discharge. It was also recommended that he follow- up with wound care and podiatry. Podiatry however wanted him to see [name] according to the patient prior to them doing anything further. His hemoglobin A1c was 9.9 as noted in the hospital. Have an MRI of the left foot performed while in the hospital on 02/04/2019. 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 There is an overlying plantar open wound noted an abscess tracking back along the lateral aspect of the fifth metatarsal shaft. There is otherwise diffuse cellulitis and mild fasciitis without findings of polymyositis. The patient did have recently pneumonia secondary to COVID-19 I looked in the chart through epic and it does appear that the patient may need to have an additional x-ray just to ensure everything is cleared and that he has no airspace disease prior to putting him into the chamber. 03/05/2019; patient was readmitted to the clinic last week. He was hospitalized twice for a viral upper respiratory tract infection from 11/1 through 11/4 and then 11/5  through 11/12 ultimately this turned out to be Covid pneumonitis. Although he was discharged on oxygen he is not using it. He says he feels fine. He has no exercise limitation no cough no sputum. His O2 sat in our clinic today was 100% on room air. 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 He received Vanco and Zosyn in the hospital and then was discharged on 2 weeks of Augmentin. I do not see any relevant cultures. He was supposed to follow-up with infectious disease but I do not see that he has an appointment. 12/8; patient saw Dr. Novella Olive of infectious disease last week. He felt that he had had adequate antibiotic therapy. He did not go to follow-up with Dr. Amalia Hailey of podiatry and I have again talked to him about the pros and cons of this. He does not want to consider a ray amputation of this time. He is aware of the risks of  recurrence, migration etc. He started HBO today and tolerated this well. He can complete the Augmentin that I gave him last week. I have looked over the lab work that Dr. Chana Bode ordered his C-reactive protein was 3.3 and his sedimentation rate was 17. The C-reactive protein is never really been measurably that high in this patient 12/15; not much change in the wound today however he has undermining along the lateral part of the foot again more extensively than last week. He has some rims of epithelialization. We have been using silver alginate. He is undergoing hyperbarics but did not dive today 12/18; in for his obligatory first total contact cast change. Unfortunately there was pus coming from the undermining area around his fifth metatarsal head. This was cultured but will preclude reapplication of a cast. He is seen in conjunction with HBO 12/24; patient had staph lugdunensis in the wound in the undermining area laterally last time. We put him on doxycycline  which should have covered this. The wound looks better today. I am going to give him another week of doxycycline before reattempting the total contact cast 12/31; the patient is completing antibiotics. Hemorrhagic debris in the distal part of the wound with some undermining distally. He also had hyper granulation. Extensive debridement with a #5 curette. The infected area that was on the lateral part of the fifth met head is closed over. I do not think he needs any more antibiotics. Patient was seen prior to HBO. Preparations for a total contact cast were made in the cast will be placed post hyperbarics 04/11/19; once again the patient arrives today without complaint. He had been in a cast all week noted that he had heavy drainage this week. This resulted in large raised areas of macerated tissue around the wound 1/14; wound bed looks better slightly smaller. Hydrofera Blue has been changing himself. He had a heavy drainage last week which caused a lot of maceration around the wound so I took him out of a total contact cast he says the drainage is actually better this week He is seen today in conjunction with HBO 1/21; returns to clinic. He was up in [place] for a day or 2 attending a funeral. He comes back in with the wound larger and with a large area of exposed bone. He had osteomyelitis and septic arthritis of the fifth left metatarsal head while he was in hospital. He received IV antibiotics in the hospital for a prolonged period of time then 3 weeks of Augmentin. Subsequently I gave him 2 weeks of doxycycline for more superficial wound infection. When I saw this last week the wound was smaller the surface of the wound looks satisfactory. 1/28; patient missed hyperbarics today. Bone biopsy I did last time showed Enterococcus faecalis and Staphylococcus lugdunensis . He has a wide area of exposed bone. We are going to use silver alginate as of today. I had another ethical discussion with the  patient. 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 In this situation I think the likelihood of healing this is low. Therefore I have recommended a ray amputation and with the patient's agreement I have referred him to Dr. Doran Durand. The other issue is that his compliance with hyperbarics has been minimal because of his work schedule and given his underlying decision I am going to stop this today READMISSION 10/24/2019 MRI 09/29/2019 left foot IMPRESSION: 1. Apparent skin ulceration inferior and lateral to the 5th metatarsal base with underlying heterogeneous T2 signal  and enhancement in the subcutaneous fat. Small peripherally enhancing fluid collections along the plantar and lateral aspects of the 5th metatarsal base suspicious for abscesses. 2. Interval amputation through the mid 5th metatarsal with nonspecific low-level marrow edema and enhancement. Given the proximity to the adjacent soft tissue inflammatory changes, osteomyelitis cannot be excluded. 3. The additional bones appear unremarkable. MRI 09/29/2019 right foot IMPRESSION: 1. Soft tissue ulceration lateral to the 5th MTP joint. There is low-level T2 hyperintensity within the 4th and 5th metatarsal heads and adjacent proximal phalanges without abnormal T1 signal or cortical destruction. These findings are nonspecific and could be seen with early marrow edema, hyperemia or early osteomyelitis. No evidence of septic joint. 2. Mild tenosynovitis and synovial enhancement associated with the extensor digitorum tendons at the level of the midfoot. 3. Diffuse low-level muscular T2 hyperintensity and enhancement, most consistent with diabetic myopathy. LEFT FOOT BONE Methicillin resistant staphylococcus aureus Staphylococcus lugdunensis MIC MIC CIPROFLOXACIN >=8 RESISTANT Resistant <=0.5 SENSI... Sensitive CLINDAMYCIN <=0.25 SENS... Sensitive >=8 RESISTANT Resistant ERYTHROMYCIN >=8 RESISTANT  Resistant >=8 RESISTANT Resistant GENTAMICIN <=0.5 SENSI... Sensitive <=0.5 SENSI... Sensitive Inducible Clindamycin NEGATIVE Sensitive NEGATIVE Sensitive OXACILLIN >=4 RESISTANT Resistant 2 SENSITIVE Sensitive RIFAMPIN <=0.5 SENSI... Sensitive <=0.5 SENSI... Sensitive TETRACYCLINE <=1 SENSITIVE Sensitive <=1 SENSITIVE Sensitive TRIMETH/SULFA <=10 SENSIT Sensitive <=10 SENSIT Sensitive ... Marland Kitchen.. VANCOMYCIN 1 SENSITIVE Sensitive <=0.5 SENSI... Sensitive Right foot bone . Component 3 wk ago Specimen Description BONE Special Requests RIGHT 4 METATARSAL SAMPLE B Gram Stain NO WBC SEEN NO ORGANISMS SEEN Culture RARE METHICILLIN RESISTANT STAPHYLOCOCCUS AUREUS NO ANAEROBES ISOLATED Performed at [facility], [place] [address], [place], [place] [address] Report Status 10/08/2019 FINAL Organism ID, Bacteria METHICILLIN RESISTANT STAPHYLOCOCCUS AUREUS Resulting Agency [facility] Susceptibility Methicillin resistant staphylococcus aureus MIC CIPROFLOXACIN >=8 RESISTANT Resistant CLINDAMYCIN <=0.25 SENS... Sensitive ERYTHROMYCIN >=8 RESISTANT Resistant GENTAMICIN <=0.5 SENSI... Sensitive Inducible Clindamycin NEGATIVE Sensitive OXACILLIN >=4 RESISTANT Resistant RIFAMPIN <=0.5 SENSI... Sensitive TETRACYCLINE <=1 SENSITIVE Sensitive TRIMETH/SULFA <=10 SENSIT Sensitive ... VANCOMYCIN 1 SENSITIVE Sensitive This is a patient we had in clinic earlier this year with a wound over his left fifth metatarsal head. 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 Doran Durand on 05/16/2019. He seems to have developed problems with wounds on his bilateral feet in June 2021 although he may have had problems earlier than this. He was  in an urgent care with a right foot ulcer on 09/26/2019 and given a course of doxycycline. This was apparently after having trouble getting into see orthopedics. He was seen by podiatry on 09/28/2019 noted to have bilateral lower extremity ulcers including the left lateral fifth metatarsal base and the right subfifth met head. It was noted that had purulent drainage at that time. He required hospitalization from 6/20 through 7/2. This was because of worsening right foot wounds. He underwent bilateral operative incision and drainage and bone biopsies bilaterally. Culture results are listed above. He has been referred back to clinic by Dr. Jacqualyn Posey of podiatry. He is also followed by Dr. Megan Salon who saw him yesterday. He was discharged from hospital on Zyvox Flagyl and Levaquin and yesterday changed to doxycycline Flagyl and Levaquin. His inflammatory markers on 6/26 showed a sedimentation rate of 129 and a C-reactive protein of 5. This is  improved to 14 and 1.3 respectively. This would indicate improvement. ABIs in our clinic today were 1.23 on the right and 1.20 on the left 11/01/2019 on evaluation today patient appears to be doing fairly well in regard to the wounds on his feet at this point. Fortunately there is no signs of active infection at this time. No fevers, chills, nausea, vomiting, or diarrhea. He currently is seeing infectious disease and still under their care at this point. Subsequently he also has both wounds which she has not been using collagen on as he did not receive that in his packaging he did not call us and let us know that. Apparently that just was missed on the order. Nonetheless we will get that straightened out today. 8/9-Patient returns for bilateral foot wounds, using Prisma with hydrogel moistened dressings, and the wounds appear stable. Patient using surgical shoes, avoiding much pressure or weightbearing as much as possible 8/16; patient has bilateral foot wounds. 1 on the  right lateral foot proximally the other is on the left mid lateral foot. Both required debridement of callus and thick skin around the wounds. We have been using silver collagen 8/27; patient has bilateral lateral foot wounds. The area on the left substantially surrounded by callus and dry skin. This was removed from the wound edge. The underlying wound is small. The area on the right measured somewhat smaller today. 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 Unfortunately I did not update his antibiotics during today's visit. 9/10 I reviewed Dr. Hale Bogus last notes he felt he had completed antibiotics his inflammatory markers were reasonably well controlled. He has a small wound on the lateral left foot and a tiny area on the right which is just above closed. He is using Hydrofera Blue with border foam he has bilateral surgical shoes 9/24; 2 week f/u. doing well. right foot is closed. left foot still undermined. 10/14; right foot remains closed at the fifth met head. The area over the base of the left fifth metatarsal has a small open area but considerable undermining towards the plantar foot. Thick callus skin around this suggests an adequate pressure relief. We have talked about this. He says he is going to go back into his cam boot. I suggested a total contact cast he did not seem enamored with this suggestion 10/26; left foot base of the fifth metatarsal. Same condition as last time. He has skin over the area with an open wound however the skin is not adherent. He went to see Dr. Earleen Newport who did an x-ray and culture of his foot I have not reviewed the x-ray but the patient was not told anything. He is on doxycycline 11/11; since the patient was last here he was in the emergency room on 10/30 he was concerned about swelling in the left foot. They did not do any cultures or x-rays. They changed his antibiotics to cephalexin.  Previous culture showed group B strep. The cephalexin is appropriate as doxycycline has less than predictable coverage. Arrives in clinic today with swelling over this area under the wound. He also has a new wound on the right fifth metatarsal head 11/18; the patient has a difficult wound on the lateral aspect of the left fifth metatarsal head. The wound was almost ballotable last week I opened it slightly expecting to see purulence however there was just bleeding. I cultured this this was negative. X-ray unchanged. We are trying to get an MRI  but I am not sure were going to be able to get this through his insurance. He also has an area on the right lateral fifth metatarsal head this looks healthier 12/3; the patient finally got our MRI. Surprisingly this did not show osteomyelitis. I did show the soft tissue ulceration at the lateral plantar aspect of the fifth metatarsal base with a tiny residual 6 mm abscess overlying the superficial fascia I have tried to culture this area I have not been able to get this to grow anything. Nevertheless the protruding tissue looks aggravated. I suspect we should try to treat the underlying "abscess with broad-spectrum antibiotics. I am going to start him on Levaquin and Flagyl. He has much less edema in his legs and I am going to continue to wrap his legs and see him weekly 12/10. I started Levaquin and Flagyl on him last week. He just picked up the Flagyl apparently there was some delay. The worry is the wound on the left fifth metatarsal base which is substantial and worsening. His foot looks like he inverts at the ankle making this a weightbearing surface. Certainly no improvement in fact I think the measurements of this are somewhat worse. We have been using 12/17; he apparently just got the Levaquin yesterday this is 2 weeks after the fact. He has completed the Flagyl. The area over the left fifth metatarsal base still has protruding granulation tissue although  it does not look quite as bad as it did some weeks ago. He has severe bilateral lymphedema although we have not been treating him for wounds on his legs this is definitely going to require compression. There was so much edema in the left I did not wish to put him in a total contact cast today. I am going to increase his compression from 3-4 layer. The area on the right lateral fifth met head actually look quite good and superficial. 12/23; patient arrived with callus on the right fifth met head and the substantial hyper granulated callused wound on the base of his fifth metatarsal. He says he is completing his Levaquin in 2 days but I do not think that adds up with what I gave him but I will have to double check this. We are using Hydrofera Blue on both areas. My plan is to put the left leg in a cast the week after New Year's 04/06/2020; patient's wounds about the same. Right lateral fifth metatarsal head and left lateral foot over the base of the fifth metatarsal. There is undermining on the left lateral foot which I removed before application of total contact cast continuing with Hydrofera Blue new. Patient tells me he was seen by endocrinology today lab work was done [Dr. Kerr]. Also wondering whether he was referred to cardiology. I went over some lab work from previously does not have chronic renal failure certainly not nephrotic range proteinuria he does have very poorly controlled diabetes but this is not his most updated lab work. Hemoglobin A1c has been over 11 1/10; the patient had a considerable amount of leakage towards mid part of his left foot with macerated skin however the wound surface looks better the area on the right lateral fifth met head is better as well. I am going to change the dressing on the left foot under the total contact cast to silver alginate, continue with Hydrofera Blue on the right. 1/20; patient was in the total contact cast for 10 days. Considerable amount of  drainage although the skin around  the wound does not look too bad on the left foot. The area on the right fifth metatarsal head is closed. Our nursing staff reports large amount of drainage out of the left lateral foot wound 1/25; continues with copious amounts of drainage described by our intake staff. PCR culture I did last week showed E. coli and Enterococcus faecalis and low quantities. Multiple resistance genes documented including extended spectrum beta lactamase, MRSA, MRSE, quinolone, tetracycline. The wound is not quite as good this week as it was 5 days ago but about the same size 2/3; continues with copious amounts of malodorous drainage per our intake nurse. The PCR culture I did 2 weeks ago showed E. coli and low quantities of Enterococcus. There were multiple resistance genes detected. I put Neosporin on him last week although this does not seem to have helped. The wound is slightly deeper today. Offloading continues to be an issue here although with the amount of drainage she has a total contact cast is just not going to work 2/10; moderate amount of drainage. Patient reports he cannot get his stocking on over the dressing. I told him we have to do that the nurse gave him suggestions on how to make this work. The wound is on the bottom and lateral part of his left foot. Is cultured predominantly grew low amounts of Enterococcus, E. coli and anaerobes. There were multiple resistance genes detected including extended spectrum beta lactamase, quinolone, tetracycline. I could not think of an easy oral combination to address this so for now I am going to do topical antibiotics provided by [facility] I think the main agents here are vancomycin and an aminoglycoside. We have to be able to give him access to the wounds to get the topical antibiotic on 2/17; moderate amount of drainage this is unchanged. He has his Keystone topical antibiotic against the deep tissue culture organisms. He has been  using this and changing the dressing daily. Silver alginate on the wound surface. 2/24; using Keystone antibiotic with silver alginate on the top. He had too much drainage for a total contact cast at one point although I think that is improving and I think in the next week or 2 it might be possible to replace a total contact cast I did not do this today. In general the wound surface looks healthy however he continues to have thick rims of skin and subcutaneous tissue around the wide area of the circumference which I debrided 06/04/2020 upon evaluation today patient appears to be doing well in regard to his wound. I do feel like he is showing signs of improvement. There is little bit of callus and dead tissue around the edges of the wound as well as what appears to be a little bit of a sinus tract that is off to the side laterally I would perform debridement to clear that away today. 3/17; left lateral foot. The wound looks about the same as I remember. Not much depth surface looks healthy. No evidence of infection 3/25; left lateral foot. Wound surface looks about the same. Separating epithelium from the circumference. There really is no evidence of infection here however not making progress by my view 3/29; left lateral foot. Surface of the wound again looks reasonably healthy still thick skin and subcutaneous tissue around the wound margins. There is no evidence of infection. One of the concerns being brought up by the nurses has again the amount of drainage vis--vis continued use of a total contact cast 4/5; left  lateral foot at roughly the base of the fifth metatarsal. Nice healthy looking granulated tissue with rims of epithelialization. The overall wound measurements are not any better but the tissue looks healthy. The only concern is the amount of drainage although he has no surrounding maceration with what we have been doing recently to absorb fluid and protect his skin. He also has lymphedema.  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 He states that the kerlix/coban under the TCC rolled down and could not get it back up. He has been using an offloading scooter and has somehow hurt his right foot using this device. This happened last week. He states that the side of his right foot developed a blister and opened. The top of his foot also has a few small open wounds he thinks is due to his socks rubbing in his shoes. He has not been using any dressings to the wound. He denies purulent drainage, fever/chills or erythema to the wounds. 4/22; patient presents for 1 week follow-up. He developed new wounds to the right foot that were evaluated at last clinic visit. He continues to have a total contact cast to the left leg and he reports no issues. He has been using silver collagen to the right foot wounds with no issues. He denies purulent drainage, fever/chills or erythema to the right foot wounds. He has no complaints today 4/25; patient presents for 1 week follow-up. He has a total contact cast of the left leg and reports no issues. He has been using silver alginate to the right foot wound. He denies purulent drainage, fever/chills or erythema to the right foot wounds. 5/2 patient presents for 1 week follow-up. T contact cast on the left. The wound which is on the base of the plantar foot at the base of the fifth metatarsal otal actually looks quite good and dimensions continue to gradually contract. HOWEVER the area on the right lateral fifth metatarsal head is much larger than what I remember from 2 weeks ago. Once more is he has significant levels of hypergranulation. Noteworthy that he had this same hyper granulated response on his wound on the  left foot at one point in time. So much so that he I thought there was an underlying fluid collection. Based on this I think this just needs debridement. 5/9; the wound on the left actually continues to be gradually smaller with a healthy surface. Slight amount of drainage and maceration of the skin around but not too bad. However he has a large wound over the right fifth metatarsal head very much in the same configuration as his left foot wound was initially. I used silver nitrate to address the hyper granulated tissue no mechanical debridement Electronic Signature(s) Signed: 08/10/2020 4:39:47 PM By: [name] MD Entered By: [name] on 08/10/2020 15:07:18 -------------------------------------------------------------------------------- Chemical Cauterization Details Patient Name: Date of Service: [name], [name] 08/10/2020 1:30 PM Medical Record Number: [id number] Patient Account Number: [id number] Date of Birth/Sex: Treating RN: [birth year] (35 y.o. [name] Primary Care Provider: [name] Other Clinician: Referring Provider: Treating Provider/Extender: [name] in Treatment: 82 Procedure Performed for: Wound #5 Right,Lateral Metatarsal head fifth Performed By: Physician [name], MD Post  Procedure Diagnosis Same as Pre-procedure Notes silver nitrate Electronic Signature(s) Signed: 08/10/2020 4:39:47 PM By: [name] MD Entered By: [name] on 08/10/2020 15:26:55 -------------------------------------------------------------------------------- Physical Exam Details Patient Name: Date of Service: [name], [name] 08/10/2020 1:30 PM Medical Record Number: [id number] Patient Account Number: [id number] Date of Birth/Sex: Treating RN: [birth year] (35 y.o. [name] Primary Care Provider: [name] Other Clinician: Referring Provider: Treating Provider/Extender: [name] in Treatment: 41 Notes Wound exam; left plantar foot wound continues to look healthier and measure smaller However the area over the right first metatarsal head has hypergranulation undermining. Not overtly infected Electronic Signature(s) Signed: 08/10/2020 4:39:47 PM By: [name] MD Entered By: [name] on 08/10/2020 15:10:49 -------------------------------------------------------------------------------- Physician Orders Details Patient Name: Date of Service: [name], [name] 08/10/2020 1:30 PM Medical Record Number: [id number] Patient Account Number: [id number] Date of Birth/Sex: Treating RN: Aug 08, 1985 (35 y.o. [name] Primary Care Provider: [name] Other Clinician: Referring Provider: Treating Provider/Extender: [name] in Treatment: 33 Verbal / Phone Orders: No Diagnosis Coding ICD-10 Coding Code Description E11.621 Type 2 diabetes mellitus with foot ulcer L97.528 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ity L97.518 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ity Follow-up Appointments ppointment in 1 week. - Dr. Dellia Nims Return A Bathing/ Shower/ Hygiene May shower with protection but do not get wound dressing(s) wet. - use a cast protector to left leg. Edema Control - Lymphedema / SCD / Other Bilateral Lower Extremities Elevate legs to the level of the heart or above for 30 minutes daily and/or when sitting, a frequency of: - throughout the day Avoid standing for long periods of time. Exercise regularly Moisturize legs daily. - right leg every night before bed. Compression stocking or Garment 20-30 mm/Hg pressure to: - Apply to right leg in the morning and remove at night. Off-Loading Total Contact Cast to Left Lower Extremity Open toe surgical shoe to: - right foot Wound Treatment Wound #3 - Foot Wound Laterality: Left,  Lateral Cleanser: Soap and Water 1 x Per Week/30 Days Discharge Instructions: May shower and wash wound with dial antibacterial soap and water prior to dressing change. Peri-Wound Care: Zinc Oxide Ointment 30g tube 1 x Per Week/30 Days Discharge Instructions: Apply Zinc Oxide to periwound with each dressing change as needed. Prim Dressing: Hydrofera Blue Classic Foam, 2x2 in 1 x Per Week/30 Days ary Discharge Instructions: Moisten with saline prior to applying to wound bed Secondary Dressing: Woven Gauze Sponge, Non-Sterile 4x4 in 1 x Per Week/30 Days Discharge Instructions: Apply over primary dressing as directed. Secondary Dressing: ABD Pad, 8x10 1 x Per Week/30 Days Discharge Instructions: T down before applying kerlix. Apply over primary dressing as directed. ape Secondary Dressing: Zetuvit Plus 4x8 in 1 x Per Week/30 Days Discharge Instructions: Apply over primary dressing as directed. Secured With: [employer], 4.5x3.1 (in/yd) 1 x Per Week/30 Days Discharge Instructions: Secure with Kerlix as directed. Wound #5 - Metatarsal head fifth Wound Laterality: Right, Lateral Cleanser: Normal Saline (Generic) Every Other Day/15 Days Discharge Instructions: Cleanse the wound with Normal Saline prior to applying a clean dressing using gauze sponges, not tissue or cotton balls. Cleanser: Soap and Water Every Other Day/15 Days Discharge Instructions: May shower and wash wound with dial antibacterial soap and water prior to dressing change. Prim Dressing: Hydrofera Blue Classic Foam, 4x4 in (Generic) Every Other Day/15 Days  ary Discharge Instructions: Moisten with saline prior to applying to wound bed Secondary Dressing: ABD Pad, 8x10 (Generic) Every Other Day/15 Days Discharge Instructions: Apply over primary dressing as directed. Secondary Dressing: Optifoam Non-Adhesive Dressing, 4x4 in (Generic) Every Other Day/15 Days Discharge Instructions: Apply over primary dressing, cut to make  foam donut to offload Secured With: Kerlix Roll Sterile, 4.5x3.1 (in/yd) (Generic) Every Other Day/15 Days Discharge Instructions: Secure with Kerlix as directed. Secured With: 37M Medipore H Soft Cloth Surgical Tape, 2x2 (in/yd) (Generic) Every Other Day/15 Days Discharge Instructions: Secure dressing with tape as directed. Electronic Signature(s) Signed: 08/10/2020 4:39:47 PM By: [name] MD Signed: 08/10/2020 5:39:20 PM By: [name] RN, BSN Entered By: [name] on 08/10/2020 14:34:53 -------------------------------------------------------------------------------- Problem List Details Patient Name: Date of Service: [name], [name] 08/10/2020 1:30 PM Medical Record Number: [id number] Patient Account Number: [id number] Date of Birth/Sex: Treating RN: 09-08-1985 (35 y.o. [name] Primary Care Provider: [name] Other Clinician: Referring Provider: Treating Provider/Extender: [name] in Treatment: 4 Active Problems ICD-10 Encounter Code Description Active Date MDM Diagnosis E11.621 Type 2 diabetes mellitus with foot ulcer 10/24/2019 No Yes L97.528 Non-pressure chronic ulcer of other part of left foot with other specified 10/24/2019 No Yes severity L97.518 Non-pressure chronic ulcer of other part of right foot with other specified 07/14/2020 No Yes severity 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 08/10/2020 No Yes Inactive Problems ICD-10 Code Description Active Date Inactive Date L97.518 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ity 10/24/2019 10/24/2019 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, right ankle and foot 10/24/2019 10/24/2019 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, left ankle and foot 10/24/2019 10/24/2019 B95.62 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 10/24/2019 10/24/2019 classified  elsewhere Resolved Problems Electronic Signature(s) Signed: 08/10/2020 4:39:47 PM By: [name] MD Entered By: [name] on 08/10/2020 14:53:16 -------------------------------------------------------------------------------- Progress Note Details Patient Name: Date of Service: [name], [name] 08/10/2020 1:30 PM Medical Record Number: [id number] Patient Account Number: [id number] Date of Birth/Sex: Treating RN: [birth year] (35 y.o. 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 He comes in for review of a wound over the plantar fifth metatarsal head extending into the lateral part of the foot. He was followed for this previously by his podiatrist Dr. Cornelius Moras. As the patient tells his story he went to see podiatry first for a swelling he developed on the lateral part of his fifth metatarsal head in May. He states this was "open" by podiatry and the area closed. He was followed up in June and it was again opened callus removed and it closed promptly. There were plans being made for surgery on the fifth metatarsal head in June however his blood sugar was apparently too high for anesthesia. Apparently the area was debrided and opened again in June and it is never closed since. Looking over the records from podiatry I am really not able to follow this. It was clear when he was first seen it was before 5/14 at that point he already had a wound. By 5/17 the ulcer was resolved. I do not see anything about a procedure. On 5/28 noted to have pre-ulcerative moderate keratosis. X-ray noted 1/5 contracted toe and tailor's  bunion and metatarsal deformity. On a visit date on 09/28/2018 the dorsal part of the left foot it healed  and resolved. There was concern about swelling in his lower extremity he was sent to the ER.. As far as I can tell he was seen in the ER on 7/12 with an ulcer on his left foot. A DVT rule out of the left leg was negative. I do not think I have complete records from podiatry but I am not able to verify the procedures this patient states he had. He states after the last procedure the wound has never closed although I am not able to follow this in the records I have from podiatry. He has not had a recent x-ray The patient has been using Neosporin on the wound. He is wearing a Darco shoe. He is still very active up on his foot working and exercising. Past medical history; type 2 diabetes ketosis-prone, leg swelling with a negative DVT study in July. Non-smoker ABI in our clinic was 0.85 on the left 34/16; substantial wound on the plantar left fifth met head extending laterally almost to the dorsal fifth MTP. We have been using silver alginate we gave him a Darco forefoot off loader. An x-ray did not show evidence of osteomyelitis did note soft tissue emphysema which I think was due to gas tracking through an open wound. There is no doubt in my mind he requires an MRI 10/23; MRI not booked until 3 November at the earliest this is largely due to his glucose sensor in the right arm. We have been using silver alginate. There has been an improvement 10/29; I am still not exactly sure when his MRI is booked for. He says it is the third but it is the 10th in epic. This definitely needs to be done. He is running a low-grade fever today but no other symptoms. No real improvement in the 1 02/26/2019 patient presents today for a follow-up visit here in our clinic he is last been seen in the clinic on October 29. Subsequently we were working on getting MRI to evaluate and see what exactly was going on and where we would need to go from the standpoint of whether or not he had osteomyelitis and again what treatments  were going be required. Subsequently the patient ended up being admitted to the hospital on 02/07/2019 and was discharged on 02/14/2019. This is a somewhat interesting admission with a discharge diagnosis of pneumonia due to COVID-19 although he was positive for COVID-19 when tested at the urgent care but negative x2 when he was actually in the hospital. With that being said he did have acute respiratory failure with hypoxia and it was noted he also have a left foot ulceration with osteomyelitis. With that being said he did require oxygen for his pneumonia and I level 4 L. He was placed on antivirals and steroids for the COVID-19. He was also transferred to the [facility] at one point. Nonetheless he did subsequently discharged home and since being home has done much better in that regard. The CT angiogram did not show any pulmonary embolism. With regard to the osteomyelitis the patient was placed on vancomycin and Zosyn while in the hospital but has been changed to Augmentin at discharge. It was also recommended that he follow- up with wound care and podiatry. Podiatry however wanted him to see [name] according to the patient prior to them doing anything further. His hemoglobin A1c was 9.9 as noted in the  hospital. Have an MRI of the left foot performed while in the hospital on 02/04/2019. 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 There is an overlying plantar open wound noted an abscess tracking back along the lateral aspect of the fifth metatarsal shaft. There is otherwise diffuse cellulitis and mild fasciitis without findings of polymyositis. The patient did have recently pneumonia secondary to COVID-19 I looked in the chart through epic and it does appear that the patient may need to have an additional x-ray just to ensure everything is cleared and that he has no airspace disease prior to putting him  into the chamber. 03/05/2019; patient was readmitted to the clinic last week. He was hospitalized twice for a viral upper respiratory tract infection from 11/1 through 11/4 and then 11/5 through 11/12 ultimately this turned out to be Covid pneumonitis. Although he was discharged on oxygen he is not using it. He says he feels fine. He has no exercise limitation no cough no sputum. His O2 sat in our clinic today was 100% on room air. 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 He received Vanco and Zosyn in the hospital and then was discharged on 2 weeks of Augmentin. I do not see any relevant cultures. He was supposed to follow-up with infectious disease but I do not see that he has an appointment. 12/8; patient saw Dr. Novella Olive of infectious disease last week. He felt that he had had adequate antibiotic therapy. He did not go to follow-up with Dr. Amalia Hailey of podiatry and I have again talked to him about the pros and cons of this. He does not want to consider a ray amputation of this time. He is aware of the risks of recurrence, migration etc. He started HBO today and tolerated this well. He can complete the Augmentin that I gave him last week. I have looked over the lab work that Dr. Chana Bode ordered his C-reactive protein was 3.3 and his sedimentation rate was 17. The C-reactive protein is never really been measurably that high in this patient 12/15; not much change in the wound today however he has undermining along the lateral part of the foot again more extensively than last week. He has some rims of epithelialization. We have been using silver alginate. He is undergoing hyperbarics but did not dive today 12/18; in for his obligatory first total contact cast change. Unfortunately there was pus coming from the undermining area around his fifth metatarsal head. This was cultured but will  preclude reapplication of a cast. He is seen in conjunction with HBO 12/24; patient had staph lugdunensis in the wound in the undermining area laterally last time. We put him on doxycycline which should have covered this. The wound looks better today. I am going to give him another week of doxycycline before reattempting the total contact cast 12/31; the patient is completing antibiotics. Hemorrhagic debris in the distal part of the wound with some undermining distally. He also had hyper granulation. Extensive debridement with a #5 curette. The infected area that was on the lateral part of the fifth met head is closed over. I do not think he needs any more antibiotics. Patient was seen prior to HBO. Preparations for a total contact cast were made in the cast will be placed post hyperbarics 04/11/19; once again the patient arrives  today without complaint. He had been in a cast all week noted that he had heavy drainage this week. This resulted in large raised areas of macerated tissue around the wound 1/14; wound bed looks better slightly smaller. Hydrofera Blue has been changing himself. He had a heavy drainage last week which caused a lot of maceration around the wound so I took him out of a total contact cast he says the drainage is actually better this week He is seen today in conjunction with HBO 1/21; returns to clinic. He was up in [place] for a day or 2 attending a funeral. He comes back in with the wound larger and with a large area of exposed bone. He had osteomyelitis and septic arthritis of the fifth left metatarsal head while he was in hospital. He received IV antibiotics in the hospital for a prolonged period of time then 3 weeks of Augmentin. Subsequently I gave him 2 weeks of doxycycline for more superficial wound infection. When I saw this last week the wound was smaller the surface of the wound looks satisfactory. 1/28; patient missed hyperbarics today. Bone biopsy I did last time  showed Enterococcus faecalis and Staphylococcus lugdunensis . He has a wide area of exposed bone. We are going to use silver alginate as of today. I had another ethical discussion with the patient. 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 In this situation I think the likelihood of healing this is low. Therefore I have recommended a ray amputation and with the patient's agreement I have referred him to Dr. Doran Durand. The other issue is that his compliance with hyperbarics has been minimal because of his work schedule and given his underlying decision I am going to stop this today READMISSION 10/24/2019 MRI 09/29/2019 left foot IMPRESSION: 1. Apparent skin ulceration inferior and lateral to the 5th metatarsal base with underlying heterogeneous T2 signal and enhancement in the subcutaneous fat. Small peripherally enhancing fluid collections along the plantar and lateral aspects of the 5th metatarsal base suspicious for abscesses. 2. Interval amputation through the mid 5th metatarsal with nonspecific low-level marrow edema and enhancement. Given the proximity to the adjacent soft tissue inflammatory changes, osteomyelitis cannot be excluded. 3. The additional bones appear unremarkable. MRI 09/29/2019 right foot IMPRESSION: 1. Soft tissue ulceration lateral to the 5th MTP joint. There is low-level T2 hyperintensity within the 4th and 5th metatarsal heads and adjacent proximal phalanges without abnormal T1 signal or cortical destruction. These findings are nonspecific and could be seen with early marrow edema, hyperemia or early osteomyelitis. No evidence of septic joint. 2. Mild tenosynovitis and synovial enhancement associated with the extensor digitorum tendons at the level of the midfoot. 3. Diffuse low-level muscular T2 hyperintensity and enhancement, most consistent with diabetic myopathy. LEFT FOOT BONE Methicillin resistant staphylococcus aureus  Staphylococcus lugdunensis MIC MIC CIPROFLOXACIN >=8 RESISTANT Resistant <=0.5 SENSI... Sensitive CLINDAMYCIN <=0.25 SENS... Sensitive >=8 RESISTANT Resistant ERYTHROMYCIN >=8 RESISTANT Resistant >=8 RESISTANT Resistant GENTAMICIN <=0.5 SENSI... Sensitive <=0.5 SENSI... Sensitive Inducible Clindamycin NEGATIVE Sensitive NEGATIVE Sensitive OXACILLIN >=4 RESISTANT Resistant 2 SENSITIVE Sensitive RIFAMPIN <=0.5 SENSI... Sensitive <=0.5 SENSI... Sensitive TETRACYCLINE <=1 SENSITIVE Sensitive <=1 SENSITIVE Sensitive TRIMETH/SULFA <=10 SENSIT Sensitive <=10 SENSIT Sensitive ... Marland Kitchen.. VANCOMYCIN 1 SENSITIVE Sensitive <=0.5 SENSI... Sensitive Right foot bone . Component 3 wk ago Specimen Description BONE Special Requests RIGHT 4 METATARSAL SAMPLE B Gram Stain NO WBC SEEN NO ORGANISMS SEEN Culture RARE METHICILLIN RESISTANT STAPHYLOCOCCUS AUREUS NO ANAEROBES ISOLATED Performed at [facility], [address] [place], [place] [address] Report Status 10/08/2019 FINAL Organism ID, Bacteria METHICILLIN RESISTANT STAPHYLOCOCCUS AUREUS Resulting Agency [facility] Susceptibility Methicillin resistant staphylococcus aureus MIC CIPROFLOXACIN >=8 RESISTANT Resistant CLINDAMYCIN <=0.25 SENS... Sensitive ERYTHROMYCIN >=8 RESISTANT Resistant GENTAMICIN <=0.5 SENSI... Sensitive Inducible Clindamycin NEGATIVE Sensitive OXACILLIN >=4 RESISTANT Resistant RIFAMPIN <=0.5 SENSI... Sensitive TETRACYCLINE <=1 SENSITIVE Sensitive TRIMETH/SULFA <=10 SENSIT Sensitive ... VANCOMYCIN 1 SENSITIVE Sensitive This is a patient we had in clinic earlier this year with a wound over his left fifth metatarsal head. 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 Doran Durand on 05/16/2019. He seems to have developed problems with wounds on his bilateral feet in June 2021 although he may have had problems earlier than this. He was in an urgent care with a right foot ulcer on 09/26/2019 and given a course of doxycycline. This was apparently after having trouble getting into see orthopedics. He was seen by podiatry on 09/28/2019 noted to have bilateral lower extremity ulcers including the left lateral fifth metatarsal base and the right subfifth met head. It was noted that had purulent drainage at that time. He required hospitalization from 6/20 through 7/2. This was because of worsening right foot wounds. He underwent bilateral operative incision and drainage and bone biopsies bilaterally. Culture results are listed above. He has been referred back to clinic by Dr. Jacqualyn Posey of podiatry. He is also followed by Dr. Megan Salon who saw him yesterday. He was discharged from hospital on Zyvox Flagyl and Levaquin and yesterday changed to doxycycline Flagyl and Levaquin. His inflammatory markers on 6/26 showed a sedimentation rate of 129 and a C-reactive protein of 5. This is improved to 14 and 1.3 respectively. This would indicate improvement. ABIs in our clinic today were 1.23 on the right and 1.20 on the left 11/01/2019 on evaluation today patient appears to be doing fairly well in regard to the wounds on his feet at this point. Fortunately there is no signs of active infection at this time. No fevers, chills, nausea, vomiting, or diarrhea. He currently is seeing infectious disease and still under their care at this point. Subsequently he also has both wounds which she has not been using collagen on as he did not receive that in his packaging he did not call us and let us know that. Apparently that just was missed on the order. Nonetheless we will get that straightened out today. 8/9-Patient returns for bilateral foot wounds, using Prisma with  hydrogel moistened dressings, and the wounds appear stable. Patient using surgical shoes, avoiding much pressure or weightbearing as much as possible 8/16; patient has bilateral foot wounds. 1 on the right lateral foot proximally the other is on the left mid lateral foot. Both required debridement of callus and thick skin around the wounds. We have been using silver collagen 8/27; patient has bilateral lateral foot wounds. The area on the left substantially surrounded by callus and dry skin. This was removed from the wound edge. The underlying wound is small. The area on the right measured somewhat smaller today. 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 Unfortunately I did not update his antibiotics during today's visit. 9/10 I reviewed Dr. Hale Bogus last notes he felt he  had completed antibiotics his inflammatory markers were reasonably well controlled. He has a small wound on the lateral left foot and a tiny area on the right which is just above closed. He is using Hydrofera Blue with border foam he has bilateral surgical shoes 9/24; 2 week f/u. doing well. right foot is closed. left foot still undermined. 10/14; right foot remains closed at the fifth met head. The area over the base of the left fifth metatarsal has a small open area but considerable undermining towards the plantar foot. Thick callus skin around this suggests an adequate pressure relief. We have talked about this. He says he is going to go back into his cam boot. I suggested a total contact cast he did not seem enamored with this suggestion 10/26; left foot base of the fifth metatarsal. Same condition as last time. He has skin over the area with an open wound however the skin is not adherent. He went to see Dr. Earleen Newport who did an x-ray and culture of his foot I have not reviewed the x-ray but the patient was not told anything. He is on doxycycline 11/11; since the  patient was last here he was in the emergency room on 10/30 he was concerned about swelling in the left foot. They did not do any cultures or x-rays. They changed his antibiotics to cephalexin. Previous culture showed group B strep. The cephalexin is appropriate as doxycycline has less than predictable coverage. Arrives in clinic today with swelling over this area under the wound. He also has a new wound on the right fifth metatarsal head 11/18; the patient has a difficult wound on the lateral aspect of the left fifth metatarsal head. The wound was almost ballotable last week I opened it slightly expecting to see purulence however there was just bleeding. I cultured this this was negative. X-ray unchanged. We are trying to get an MRI but I am not sure were going to be able to get this through his insurance. He also has an area on the right lateral fifth metatarsal head this looks healthier 12/3; the patient finally got our MRI. Surprisingly this did not show osteomyelitis. I did show the soft tissue ulceration at the lateral plantar aspect of the fifth metatarsal base with a tiny residual 6 mm abscess overlying the superficial fascia I have tried to culture this area I have not been able to get this to grow anything. Nevertheless the protruding tissue looks aggravated. I suspect we should try to treat the underlying "abscess with broad-spectrum antibiotics. I am going to start him on Levaquin and Flagyl. He has much less edema in his legs and I am going to continue to wrap his legs and see him weekly 12/10. I started Levaquin and Flagyl on him last week. He just picked up the Flagyl apparently there was some delay. The worry is the wound on the left fifth metatarsal base which is substantial and worsening. His foot looks like he inverts at the ankle making this a weightbearing surface. Certainly no improvement in fact I think the measurements of this are somewhat worse. We have been using 12/17; he  apparently just got the Levaquin yesterday this is 2 weeks after the fact. He has completed the Flagyl. The area over the left fifth metatarsal base still has protruding granulation tissue although it does not look quite as bad as it did some weeks ago. He has severe bilateral lymphedema although we have not been treating him for wounds on his legs  this is definitely going to require compression. There was so much edema in the left I did not wish to put him in a total contact cast today. I am going to increase his compression from 3-4 layer. The area on the right lateral fifth met head actually look quite good and superficial. 12/23; patient arrived with callus on the right fifth met head and the substantial hyper granulated callused wound on the base of his fifth metatarsal. He says he is completing his Levaquin in 2 days but I do not think that adds up with what I gave him but I will have to double check this. We are using Hydrofera Blue on both areas. My plan is to put the left leg in a cast the week after New Year's 04/06/2020; patient's wounds about the same. Right lateral fifth metatarsal head and left lateral foot over the base of the fifth metatarsal. There is undermining on the left lateral foot which I removed before application of total contact cast continuing with Hydrofera Blue new. Patient tells me he was seen by endocrinology today lab work was done [Dr. Kerr]. Also wondering whether he was referred to cardiology. I went over some lab work from previously does not have chronic renal failure certainly not nephrotic range proteinuria he does have very poorly controlled diabetes but this is not his most updated lab work. Hemoglobin A1c has been over 11 1/10; the patient had a considerable amount of leakage towards mid part of his left foot with macerated skin however the wound surface looks better the area on the right lateral fifth met head is better as well. I am going to change the  dressing on the left foot under the total contact cast to silver alginate, continue with Hydrofera Blue on the right. 1/20; patient was in the total contact cast for 10 days. Considerable amount of drainage although the skin around the wound does not look too bad on the left foot. The area on the right fifth metatarsal head is closed. Our nursing staff reports large amount of drainage out of the left lateral foot wound 1/25; continues with copious amounts of drainage described by our intake staff. PCR culture I did last week showed E. coli and Enterococcus faecalis and low quantities. Multiple resistance genes documented including extended spectrum beta lactamase, MRSA, MRSE, quinolone, tetracycline. The wound is not quite as good this week as it was 5 days ago but about the same size 2/3; continues with copious amounts of malodorous drainage per our intake nurse. The PCR culture I did 2 weeks ago showed E. coli and low quantities of Enterococcus. There were multiple resistance genes detected. I put Neosporin on him last week although this does not seem to have helped. The wound is slightly deeper today. Offloading continues to be an issue here although with the amount of drainage she has a total contact cast is just not going to work 2/10; moderate amount of drainage. Patient reports he cannot get his stocking on over the dressing. I told him we have to do that the nurse gave him suggestions on how to make this work. The wound is on the bottom and lateral part of his left foot. Is cultured predominantly grew low amounts of Enterococcus, E. coli and anaerobes. There were multiple resistance genes detected including extended spectrum beta lactamase, quinolone, tetracycline. I could not think of an easy oral combination to address this so for now I am going to do topical antibiotics provided by [employer] I  think the main agents here are vancomycin and an aminoglycoside. We have to be able to give him  access to the wounds to get the topical antibiotic on 2/17; moderate amount of drainage this is unchanged. He has his Keystone topical antibiotic against the deep tissue culture organisms. He has been using this and changing the dressing daily. Silver alginate on the wound surface. 2/24; using Keystone antibiotic with silver alginate on the top. He had too much drainage for a total contact cast at one point although I think that is improving and I think in the next week or 2 it might be possible to replace a total contact cast I did not do this today. In general the wound surface looks healthy however he continues to have thick rims of skin and subcutaneous tissue around the wide area of the circumference which I debrided 06/04/2020 upon evaluation today patient appears to be doing well in regard to his wound. I do feel like he is showing signs of improvement. There is little bit of callus and dead tissue around the edges of the wound as well as what appears to be a little bit of a sinus tract that is off to the side laterally I would perform debridement to clear that away today. 3/17; left lateral foot. The wound looks about the same as I remember. Not much depth surface looks healthy. No evidence of infection 3/25; left lateral foot. Wound surface looks about the same. Separating epithelium from the circumference. There really is no evidence of infection here however not making progress by my view 3/29; left lateral foot. Surface of the wound again looks reasonably healthy still thick skin and subcutaneous tissue around the wound margins. There is no evidence of infection. One of the concerns being brought up by the nurses has again the amount of drainage vis--vis continued use of a total contact cast 4/5; left lateral foot at roughly the base of the fifth metatarsal. Nice healthy looking granulated tissue with rims of epithelialization. The overall wound measurements are not any better but the  tissue looks healthy. The only concern is the amount of drainage although he has no surrounding maceration with what we have been doing recently to absorb fluid and protect his skin. He also has lymphedema. 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 He states that the kerlix/coban under the TCC rolled down and could not get it back up. He has been using an offloading scooter and has somehow hurt his right foot using this device. This happened last week. He states that the side of his right foot developed a blister and opened. The top of his foot also has a few small open wounds he thinks is due to his socks rubbing in his shoes. He has not been using any dressings to the wound. He denies purulent drainage, fever/chills or erythema to the wounds. 4/22; patient presents for 1 week follow-up. He developed new wounds to the right foot that were evaluated at last clinic visit. He continues to have a total contact cast to the left leg and he reports no issues. He has been using silver collagen to the right foot wounds with no issues. He denies purulent drainage, fever/chills or erythema to the  right foot wounds. He has no complaints today 4/25; patient presents for 1 week follow-up. He has a total contact cast of the left leg and reports no issues. He has been using silver alginate to the right foot wound. He denies purulent drainage, fever/chills or erythema to the right foot wounds. 5/2 patient presents for 1 week follow-up. T contact cast on the left. The wound which is on the base of the plantar foot at the base of the fifth metatarsal otal actually looks quite good and dimensions continue to gradually contract. HOWEVER the area on the right lateral fifth  metatarsal head is much larger than what I remember from 2 weeks ago. Once more is he has significant levels of hypergranulation. Noteworthy that he had this same hyper granulated response on his wound on the left foot at one point in time. So much so that he I thought there was an underlying fluid collection. Based on this I think this just needs debridement. 5/9; the wound on the left actually continues to be gradually smaller with a healthy surface. Slight amount of drainage and maceration of the skin around but not too bad. However he has a large wound over the right fifth metatarsal head very much in the same configuration as his left foot wound was initially. I used silver nitrate to address the hyper granulated tissue no mechanical debridement Objective Constitutional Vitals Time Taken: 1:45 PM, Height: 77 in, Weight: 280 lbs, BMI: 33.2, Temperature: 97.5 F, Pulse: 86 bpm, Respiratory Rate: 18 breaths/min, Blood Pressure: 143/89 mmHg, Capillary Blood Glucose: 77 mg/dl. Integumentary (Hair, Skin) Wound #3 status is Open. Original cause of wound was Trauma. The date acquired was: 10/02/2019. The wound has been in treatment 41 weeks. The wound is located on the Left,Lateral Foot. The wound measures 2.5cm length x 1.9cm width x 0.5cm depth; 3.731cm^2 area and 1.865cm^3 volume. There is Fat Layer (Subcutaneous Tissue) exposed. There is no tunneling or undermining noted. There is a medium amount of serosanguineous drainage noted. The wound margin is well defined and not attached to the wound base. There is large (67-100%) red granulation within the wound bed. There is no necrotic tissue within the wound bed. General Notes: calloused periwound Wound #5 status is Open. Original cause of wound was Gradually Appeared. The date acquired was: 07/07/2020. The wound has been in treatment 3 weeks. The wound is located on the Right,Lateral Metatarsal head fifth. The wound measures 2.9cm length x 3.3cm width  x 0.4cm depth; 7.516cm^2 area and 3.007cm^3 volume. There is Fat Layer (Subcutaneous Tissue) exposed. There is no tunneling noted, however, there is undermining starting at 3:00 and ending at 7:00 with a maximum distance of 1.2cm. There is a medium amount of serosanguineous drainage noted. The wound margin is well defined and not attached to the wound base. There is large (67-100%) red, pink granulation within the wound bed. There is no necrotic tissue within the wound bed. 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 There was a T Contact Cast otal Procedure by [name], MD. Post procedure Diagnosis Wound #3: Same as Pre-Procedure Wound #5 Pre-procedure  diagnosis of Wound #5 is a Diabetic Wound/Ulcer of the Lower Extremity located on the Right,Lateral Metatarsal head fifth . An Chemical Cauterization procedure was performed by [name], MD. Post procedure Diagnosis Wound #5: Same as Pre-Procedure Notes: silver nitrate Plan Follow-up Appointments: Return Appointment in 1 week. - Dr. Dellia Nims Bathing/ Shower/ Hygiene: May shower with protection but do not get wound dressing(s) wet. - use a cast protector to left leg. Edema Control - Lymphedema / SCD / Other: Elevate legs to the level of the heart or above for 30 minutes daily and/or when sitting, a frequency of: - throughout the day Avoid standing for long periods of time. Exercise regularly Moisturize legs daily. - right leg every night before bed. Compression stocking or  Garment 20-30 mm/Hg pressure to: - Apply to right leg in the morning and remove at night. Off-Loading: T Contact Cast to Left Lower Extremity otal Open toe surgical shoe to: - right foot WOUND #3: - Foot Wound Laterality: Left, Lateral Cleanser: Soap and Water 1 x Per Week/30 Days Discharge Instructions: May shower and wash wound with dial antibacterial soap and water prior to dressing change. Peri-Wound Care: Zinc Oxide Ointment 30g tube 1 x Per Week/30 Days Discharge Instructions: Apply Zinc Oxide to periwound with each dressing change as needed. Prim Dressing: Hydrofera Blue Classic Foam, 2x2 in 1 x Per Week/30 Days ary Discharge Instructions: Moisten with saline prior to applying to wound bed Secondary Dressing: Woven Gauze Sponge, Non-Sterile 4x4 in 1 x Per Week/30 Days Discharge Instructions: Apply over primary dressing as directed. Secondary Dressing: ABD Pad, 8x10 1 x Per Week/30 Days Discharge Instructions: T down before applying kerlix. Apply over primary dressing as directed. ape Secondary Dressing: Zetuvit Plus 4x8 in 1 x Per Week/30 Days Discharge Instructions: Apply over primary dressing as directed. Secured With: [employer], 4.5x3.1 (in/yd) 1 x Per Week/30 Days Discharge Instructions: Secure with Kerlix as directed. WOUND #5: - Metatarsal head fifth Wound Laterality: Right, Lateral Cleanser: Normal Saline (Generic) Every Other Day/15 Days Discharge Instructions: Cleanse the wound with Normal Saline prior to applying a clean dressing using gauze sponges, not tissue or cotton balls. Cleanser: Soap and Water Every Other Day/15 Days Discharge Instructions: May shower and wash wound with dial antibacterial soap and water prior to dressing change. Prim Dressing: Hydrofera Blue Classic Foam, 4x4 in (Generic) Every Other Day/15 Days ary Discharge Instructions: Moisten with saline prior to applying to wound bed Secondary Dressing: ABD Pad, 8x10 (Generic) Every Other  Day/15 Days Discharge Instructions: Apply over primary dressing as directed. Secondary Dressing: Optifoam Non-Adhesive Dressing, 4x4 in (Generic) Every Other Day/15 Days Discharge Instructions: Apply over primary dressing, cut to make foam donut to offload Secured With: Kerlix Roll Sterile, 4.5x3.1 (in/yd) (Generic) Every Other Day/15 Days Discharge Instructions: Secure with Kerlix as directed. Secured With: 34M Medipore H Soft Cloth Surgical T ape, 2x2 (in/yd) (Generic) Every Other Day/15 Days Discharge Instructions: Secure dressing with tape as directed. 1. I continue with Hydrofera Blue to both wound areas 2. T contact cast to continue on the left foot otal 3. The overall plan here would be to consider total contact casting the right foot when the left is closed over. Measuring slightly smaller today on the left Electronic Signature(s) Signed: 08/10/2020 4:39:47 PM By: [name] MD Entered By: [name] on 08/10/2020 15:13:49 -------------------------------------------------------------------------------- Total Contact Cast Details Patient Name: Date of Service: [name], [name] 08/10/2020 1:30 PM Medical Record Number: [id number] Patient Account Number:  [id number] Date of Birth/Sex: Treating RN: 07/07/1985 (35 y.o. [name] Primary Care Provider: [name] Other Clinician: Referring Provider: Treating Provider/Extender: [name] in Treatment: 98 T Contact Cast Applied for Wound Assessment: otal Wound #3 Left,Lateral Foot Performed By: Physician [name], MD Post Procedure Diagnosis Same as Pre-procedure Electronic Signature(s) Signed: 08/10/2020 4:39:47 PM By: [name] MD Entered By: [name] on 08/10/2020 15:26:45 -------------------------------------------------------------------------------- SuperBill Details Patient Name: Date of Service: [name], [name] 08/10/2020 Medical Record Number:  [id number] Patient Account Number: [id number] Date of Birth/Sex: Treating RN: Mar 11, 1986 (35 y.o. 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 Facility Procedures CPT4 Code: [id number] Description: [phone] - APPLY TOTAL CONTACT LEG CAST ICD-10 Diagnosis Description L97.528 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 Modifier: ity Quantity: 1 CPT4 Code: [id number] Description: [id number] - CHEM CAUT GRANULATION TISS ICD-10 Diagnosis Description L97.518 Non-pressure chronic ulcer of other part of right foot with other specified seve Modifier: 59 rity Quantity: 1 Physician Procedures : CPT4 Code Description Modifier [id number] - WC PHYS APPLY TOTAL CONTACT CAST ICD-10 Diagnosis Description L97.528 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ity Quantity: 1 : [phone] [id number] - WC PHYS CHEM CAUT GRAN TISSUE 59 ICD-10 Diagnosis Description L97.518 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ity Quantity: 1 Electronic Signature(s) Signed: 08/10/2020 4:39:47 PM By: [name] MD Entered By: [name] on 08/10/2020 15:28:00

## 2020-08-11 NOTE — Progress Notes (Signed)
[place], [name] ([id number]) Visit Report for 07/14/2020 Chief Complaint Document Details Patient Name: Date of Service: [name] 07/14/2020 2:15 PM Medical Record Number: [id number] Patient Account Number: [id number] Date of Birth/Sex: Treating RN: 1985/07/31 (35 y.o. [name], [name] Primary Care Provider: [name] Other Clinician: Referring Provider: Treating Provider/Extender: [name] in Treatment: 37 Information Obtained from: Patient Chief Complaint 01/11/2019; patient is here for review of a rather substantial wound over the left fifth plantar metatarsal head extending into the lateral part of his foot 10/24/2019; patient returns to clinic with wounds on his bilateral feet with underlying osteomyelitis biopsy-proven Electronic Signature(s) Signed: 07/14/2020 5:15:42 PM By: [name] DO Entered By: [name] on 07/14/2020 16:44:51 -------------------------------------------------------------------------------- Debridement Details Patient Name: Date of Service: [name], [name] 07/14/2020 2:15 PM Medical Record Number: [id number] Patient Account Number: [id number] Date of Birth/Sex: Treating RN: 1986/02/04 (35 y.o. [name] Primary Care Provider: [name] Other Clinician: Referring Provider: Treating Provider/Extender: [name] in Treatment: 37 Debridement Performed for Assessment: Wound #3 Left,Lateral Foot Performed By: Physician [name], DO Debridement Type: Debridement Severity of Tissue Pre Debridement: Fat layer exposed Level of Consciousness (Pre-procedure): Awake and Alert Pre-procedure Verification/Time Out Yes - 16:05 Taken: Start Time: 16:06 Pain Control: Lidocaine 4% T opical Solution T Area Debrided (L x W): otal 3 (cm) x 0.5 (cm) = 1.5 (cm) Tissue and other material debrided: Non-Viable, Callus Level: Non-Viable Tissue Debridement Description:  Selective/Open Wound Instrument: Curette Bleeding: Moderate Hemostasis Achieved: Pressure End Time: 16:10 Procedural Pain: 0 Post Procedural Pain: 0 Response to Treatment: Procedure was tolerated well Level of Consciousness (Post- Awake and Alert procedure): Post Debridement Measurements of Total Wound Length: (cm) 3 Width: (cm) 2.7 Depth: (cm) 0.2 Volume: (cm) 1.272 Character of Wound/Ulcer Post Debridement: Improved Severity of Tissue Post Debridement: Fat layer exposed Post Procedure Diagnosis Same as Pre-procedure Electronic Signature(s) Signed: 07/14/2020 5:15:42 PM By: [name] DO Signed: 07/14/2020 6:06:00 PM By: Deon Pilling Entered By: Deon Pilling on 07/14/2020 16:11:08 -------------------------------------------------------------------------------- Debridement Details Patient Name: Date of Service: [name], [name] 07/14/2020 2:15 PM Medical Record Number: [id number] Patient Account Number: [id number] Date of Birth/Sex: Treating RN: 1985/04/12 (35 y.o. [name] Primary Care Provider: [name] Other Clinician: Referring Provider: Treating Provider/Extender: [name] in Treatment: 37 Debridement Performed for Assessment: Wound #5 Right,Lateral Metatarsal head fifth Performed By: Physician [name], DO Debridement Type: Debridement Severity of Tissue Pre Debridement: Fat layer exposed Level of Consciousness (Pre-procedure): Awake and Alert Pre-procedure Verification/Time Out Yes - 16:05 Taken: Start Time: 16:06 Pain Control: Lidocaine 4% T opical Solution T Area Debrided (L x W): otal 3 (cm) x 4 (cm) = 12 (cm) Tissue and other material debrided: Viable, Non-Viable, Callus, Subcutaneous, Skin: Dermis , Skin: Epidermis, Fibrin/Exudate Level: Skin/Subcutaneous Tissue Debridement Description: Excisional Instrument: Curette, Forceps, Scissors Bleeding: Moderate Hemostasis Achieved: Pressure End  Time: 16:10 Procedural Pain: 0 Post Procedural Pain: 0 Response to Treatment: Procedure was tolerated well Level of Consciousness (Post- Awake and Alert procedure): Post Debridement Measurements of Total Wound Length: (cm) 3 Width: (cm) 4 Depth: (cm) 0.5 Volume: (cm) 4.712 Character of Wound/Ulcer Post Debridement: Improved Severity of Tissue Post Debridement: Fat layer exposed Post Procedure Diagnosis Same as Pre-procedure Electronic Signature(s) Signed: 07/14/2020 5:15:42 PM By: [name] DO Signed: 07/14/2020 6:06:00 PM By: Deon Pilling Entered By: Deon Pilling on 07/14/2020 16:21:05 -------------------------------------------------------------------------------- Debridement Details Patient Name: Date of Service: A  RMSTRO NG, [name] 07/14/2020 2:15 PM Medical Record Number: [id number] Patient Account Number: [id number] Date of Birth/Sex: Treating RN: Aug 03, 1985 (35 y.o. [name] Primary Care Provider: [name] Other Clinician: Referring Provider: Treating Provider/Extender: [name] in Treatment: 37 Debridement Performed for Assessment: Wound #6 Right,Anterior Ankle Performed By: Clinician Baruch Gouty, RN Debridement Type: Chemical/Enzymatic/Mechanical Agent Used: gauze and wound cleanser Severity of Tissue Pre Debridement: Fat layer exposed Level of Consciousness (Pre-procedure): Awake and Alert Pre-procedure Verification/Time Out No Taken: Bleeding: None Response to Treatment: Procedure was tolerated well Level of Consciousness (Post- Awake and Alert procedure): Post Debridement Measurements of Total Wound Length: (cm) 0.4 Width: (cm) 0.8 Depth: (cm) 0.2 Volume: (cm) 0.05 Character of Wound/Ulcer Post Debridement: Improved Severity of Tissue Post Debridement: Fat layer exposed Post Procedure Diagnosis Same as Pre-procedure Electronic Signature(s) Signed: 07/14/2020 5:15:42 PM By: [name] DO Signed:  07/14/2020 6:06:00 PM By: Deon Pilling Entered By: Deon Pilling on 07/14/2020 16:30:03 -------------------------------------------------------------------------------- HPI Details Patient Name: Date of Service: [name], [name] 07/14/2020 2:15 PM Medical Record Number: [id number] Patient Account Number: [id number] Date of Birth/Sex: Treating RN: [birth year] (35 y.o. 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 He comes in for review of a wound over the plantar fifth metatarsal head extending into the lateral part of the foot. He was followed for this previously by his podiatrist Dr. Cornelius Moras. As the patient tells his story he went to see podiatry first for a swelling he developed on the lateral part of his fifth metatarsal head in May. He states this was "open" by podiatry and the area closed. He was followed up in June and it was again opened callus removed and it closed promptly. There were plans being made for surgery on the fifth metatarsal head in June however his blood sugar was apparently too high for anesthesia. Apparently the area was debrided and opened again in June and it is never closed since. Looking over the records from podiatry I am really not able to follow this. It was clear when he was first seen it was before 5/14 at that point he already had a wound. By 5/17 the ulcer was resolved. I do not see anything about a procedure. On 5/28 noted to have pre-ulcerative moderate keratosis. X-ray noted 1/5 contracted toe and tailor's bunion and metatarsal deformity. On a visit date on 09/28/2018 the dorsal part of the left foot it healed and resolved. There was concern about swelling in his lower extremity he  was sent to the ER.. As far as I can tell he was seen in the ER on 7/12 with an ulcer on his left foot. A DVT rule out of the left leg was negative. I do not think I have complete records from podiatry but I am not able to verify the procedures this patient states he had. He states after the last procedure the wound has never closed although I am not able to follow this in the records I have from podiatry. He has not had a recent x-ray The patient has been using Neosporin on the wound. He is wearing a Darco shoe. He is still very active up on his foot working and exercising. Past medical history; type 2 diabetes ketosis-prone, leg swelling with a  negative DVT study in July. Non-smoker ABI in our clinic was 0.85 on the left 10/16; substantial wound on the plantar left fifth met head extending laterally almost to the dorsal fifth MTP. We have been using silver alginate we gave him a Darco forefoot off loader. An x-ray did not show evidence of osteomyelitis did note soft tissue emphysema which I think was due to gas tracking through an open wound. There is no doubt in my mind he requires an MRI 10/23; MRI not booked until 3 November at the earliest this is largely due to his glucose sensor in the right arm. We have been using silver alginate. There has been an improvement 10/29; I am still not exactly sure when his MRI is booked for. He says it is the third but it is the 10th in epic. This definitely needs to be done. He is running a low-grade fever today but no other symptoms. No real improvement in the 1 02/26/2019 patient presents today for a follow-up visit here in our clinic he is last been seen in the clinic on October 29. Subsequently we were working on getting MRI to evaluate and see what exactly was going on and where we would need to go from the standpoint of whether or not he had osteomyelitis and again what treatments were going be required. Subsequently the patient ended up being admitted  to the hospital on 02/07/2019 and was discharged on 02/14/2019. This is a somewhat interesting admission with a discharge diagnosis of pneumonia due to COVID-19 although he was positive for COVID-19 when tested at the urgent care but negative x2 when he was actually in the hospital. With that being said he did have acute respiratory failure with hypoxia and it was noted he also have a left foot ulceration with osteomyelitis. With that being said he did require oxygen for his pneumonia and I level 4 L. He was placed on antivirals and steroids for the COVID-19. He was also transferred to the [place] at one point. Nonetheless he did subsequently discharged home and since being home has done much better in that regard. The CT angiogram did not show any pulmonary embolism. With regard to the osteomyelitis the patient was placed on vancomycin and Zosyn while in the hospital but has been changed to Augmentin at discharge. It was also recommended that he follow- up with wound care and podiatry. Podiatry however wanted him to see [name] according to the patient prior to them doing anything further. His hemoglobin A1c was 9.9 as noted in the hospital. Have an MRI of the left foot performed while in the hospital on 02/04/2019. 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 There is an overlying plantar open wound noted an abscess tracking back along the lateral aspect of the fifth metatarsal shaft. There is otherwise diffuse cellulitis and mild fasciitis without findings of polymyositis. The patient did have recently pneumonia secondary to COVID-19 I looked in the chart through epic and it does appear that the patient may need to have an additional x-ray just to ensure everything is cleared and that he has no airspace disease prior to putting him into the chamber. 03/05/2019; patient was readmitted to the clinic last  week. He was hospitalized twice for a viral upper respiratory tract infection from 11/1 through 11/4 and then 11/5 through 11/12 ultimately this turned out to be Covid pneumonitis. Although he was discharged on oxygen  he is not using it. He says he feels fine. He has no exercise limitation no cough no sputum. His O2 sat in our clinic today was 100% on room air. 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 He received Vanco and Zosyn in the hospital and then was discharged on 2 weeks of Augmentin. I do not see any relevant cultures. He was supposed to follow-up with infectious disease but I do not see that he has an appointment. 12/8; patient saw Dr. Novella Olive of infectious disease last week. He felt that he had had adequate antibiotic therapy. He did not go to follow-up with Dr. Amalia Hailey of podiatry and I have again talked to him about the pros and cons of this. He does not want to consider a ray amputation of this time. He is aware of the risks of recurrence, migration etc. He started HBO today and tolerated this well. He can complete the Augmentin that I gave him last week. I have looked over the lab work that Dr. Chana Bode ordered his C-reactive protein was 3.3 and his sedimentation rate was 17. The C-reactive protein is never really been measurably that high in this patient 12/15; not much change in the wound today however he has undermining along the lateral part of the foot again more extensively than last week. He has some rims of epithelialization. We have been using silver alginate. He is undergoing hyperbarics but did not dive today 12/18; in for his obligatory first total contact cast change. Unfortunately there was pus coming from the undermining area around his fifth metatarsal head. This was cultured but will preclude reapplication of a cast. He is seen in conjunction with HBO 12/24;  patient had staph lugdunensis in the wound in the undermining area laterally last time. We put him on doxycycline which should have covered this. The wound looks better today. I am going to give him another week of doxycycline before reattempting the total contact cast 12/31; the patient is completing antibiotics. Hemorrhagic debris in the distal part of the wound with some undermining distally. He also had hyper granulation. Extensive debridement with a #5 curette. The infected area that was on the lateral part of the fifth met head is closed over. I do not think he needs any more antibiotics. Patient was seen prior to HBO. Preparations for a total contact cast were made in the cast will be placed post hyperbarics 04/11/19; once again the patient arrives today without complaint. He had been in a cast all week noted that he had heavy drainage this week. This resulted in large raised areas of macerated tissue around the wound 1/14; wound bed looks better slightly smaller. Hydrofera Blue has been changing himself. He had a heavy drainage last week which caused a lot of maceration around the wound so I took him out of a total contact cast he says the drainage is actually better this week He is seen today in conjunction with HBO 1/21; returns to clinic. He was up in [place] for a day or 2 attending a funeral. He comes back in with the wound larger and with a large area of exposed bone. He had osteomyelitis and septic arthritis of the fifth left metatarsal head while he was in hospital. He received IV antibiotics in the hospital for a prolonged period of time then 3 weeks of Augmentin. Subsequently I gave him 2 weeks of doxycycline  for more superficial wound infection. When I saw this last week the wound was smaller the surface of the wound looks satisfactory. 1/28; patient missed hyperbarics today. Bone biopsy I did last time showed Enterococcus faecalis and Staphylococcus lugdunensis . He has a wide area  of exposed bone. We are going to use silver alginate as of today. I had another ethical discussion with the patient. 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 In this situation I think the likelihood of healing this is low. Therefore I have recommended a ray amputation and with the patient's agreement I have referred him to Dr. Doran Durand. The other issue is that his compliance with hyperbarics has been minimal because of his work schedule and given his underlying decision I am going to stop this today READMISSION 10/24/2019 MRI 09/29/2019 left foot IMPRESSION: 1. Apparent skin ulceration inferior and lateral to the 5th metatarsal base with underlying heterogeneous T2 signal and enhancement in the subcutaneous fat. Small peripherally enhancing fluid collections along the plantar and lateral aspects of the 5th metatarsal base suspicious for abscesses. 2. Interval amputation through the mid 5th metatarsal with nonspecific low-level marrow edema and enhancement. Given the proximity to the adjacent soft tissue inflammatory changes, osteomyelitis cannot be excluded. 3. The additional bones appear unremarkable. MRI 09/29/2019 right foot IMPRESSION: 1. Soft tissue ulceration lateral to the 5th MTP joint. There is low-level T2 hyperintensity within the 4th and 5th metatarsal heads and adjacent proximal phalanges without abnormal T1 signal or cortical destruction. These findings are nonspecific and could be seen with early marrow edema, hyperemia or early osteomyelitis. No evidence of septic joint. 2. Mild tenosynovitis and synovial enhancement associated with the extensor digitorum tendons at the level of the midfoot. 3. Diffuse low-level muscular T2 hyperintensity and enhancement, most consistent with diabetic myopathy. LEFT FOOT BONE Methicillin resistant staphylococcus aureus Staphylococcus lugdunensis MIC MIC CIPROFLOXACIN >=8 RESISTANT Resistant <=0.5  SENSI... Sensitive CLINDAMYCIN <=0.25 SENS... Sensitive >=8 RESISTANT Resistant ERYTHROMYCIN >=8 RESISTANT Resistant >=8 RESISTANT Resistant GENTAMICIN <=0.5 SENSI... Sensitive <=0.5 SENSI... Sensitive Inducible Clindamycin NEGATIVE Sensitive NEGATIVE Sensitive OXACILLIN >=4 RESISTANT Resistant 2 SENSITIVE Sensitive RIFAMPIN <=0.5 SENSI... Sensitive <=0.5 SENSI... Sensitive TETRACYCLINE <=1 SENSITIVE Sensitive <=1 SENSITIVE Sensitive TRIMETH/SULFA <=10 SENSIT Sensitive <=10 SENSIT Sensitive ... Marland Kitchen.. VANCOMYCIN 1 SENSITIVE Sensitive <=0.5 SENSI... Sensitive Right foot bone . Component 3 wk ago Specimen Description BONE Special Requests RIGHT 4 METATARSAL SAMPLE B Gram Stain NO WBC SEEN NO ORGANISMS SEEN Culture RARE METHICILLIN RESISTANT STAPHYLOCOCCUS AUREUS NO ANAEROBES ISOLATED Performed at [facility], [place] [address] [place], [place] [address] Report Status 10/08/2019 FINAL Organism ID, Bacteria METHICILLIN RESISTANT STAPHYLOCOCCUS AUREUS Resulting Agency [facility] Susceptibility Methicillin resistant staphylococcus aureus MIC CIPROFLOXACIN >=8 RESISTANT Resistant CLINDAMYCIN <=0.25 SENS... Sensitive ERYTHROMYCIN >=8 RESISTANT Resistant GENTAMICIN <=0.5 SENSI... Sensitive Inducible Clindamycin NEGATIVE Sensitive OXACILLIN >=4 RESISTANT Resistant RIFAMPIN <=0.5 SENSI... Sensitive TETRACYCLINE <=1 SENSITIVE Sensitive TRIMETH/SULFA <=10 SENSIT Sensitive ... VANCOMYCIN 1 SENSITIVE Sensitive This is a patient we had in clinic earlier this year with a wound over his left fifth metatarsal head. 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 Doran Durand on 05/16/2019. He seems to have developed problems  with wounds on his bilateral feet in June 2021 although he may have  had problems earlier than this. He was in an urgent care with a right foot ulcer on 09/26/2019 and given a course of doxycycline. This was apparently after having trouble getting into see orthopedics. He was seen by podiatry on 09/28/2019 noted to have bilateral lower extremity ulcers including the left lateral fifth metatarsal base and the right subfifth met head. It was noted that had purulent drainage at that time. He required hospitalization from 6/20 through 7/2. This was because of worsening right foot wounds. He underwent bilateral operative incision and drainage and bone biopsies bilaterally. Culture results are listed above. He has been referred back to clinic by Dr. Jacqualyn Posey of podiatry. He is also followed by Dr. Megan Salon who saw him yesterday. He was discharged from hospital on Zyvox Flagyl and Levaquin and yesterday changed to doxycycline Flagyl and Levaquin. His inflammatory markers on 6/26 showed a sedimentation rate of 129 and a C-reactive protein of 5. This is improved to 14 and 1.3 respectively. This would indicate improvement. ABIs in our clinic today were 1.23 on the right and 1.20 on the left 11/01/2019 on evaluation today patient appears to be doing fairly well in regard to the wounds on his feet at this point. Fortunately there is no signs of active infection at this time. No fevers, chills, nausea, vomiting, or diarrhea. He currently is seeing infectious disease and still under their care at this point. Subsequently he also has both wounds which she has not been using collagen on as he did not receive that in his packaging he did not call us and let us know that. Apparently that just was missed on the order. Nonetheless we will get that straightened out today. 8/9-Patient returns for bilateral foot wounds, using Prisma with hydrogel moistened dressings, and the wounds appear stable. Patient using surgical  shoes, avoiding much pressure or weightbearing as much as possible 8/16; patient has bilateral foot wounds. 1 on the right lateral foot proximally the other is on the left mid lateral foot. Both required debridement of callus and thick skin around the wounds. We have been using silver collagen 8/27; patient has bilateral lateral foot wounds. The area on the left substantially surrounded by callus and dry skin. This was removed from the wound edge. The underlying wound is small. The area on the right measured somewhat smaller today. 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 Unfortunately I did not update his antibiotics during today's visit. 9/10 I reviewed Dr. Hale Bogus last notes he felt he had completed antibiotics his inflammatory markers were reasonably well controlled. He has a small wound on the lateral left foot and a tiny area on the right which is just above closed. He is using Hydrofera Blue with border foam he has bilateral surgical shoes 9/24; 2 week f/u. doing well. right foot is closed. left foot still undermined. 10/14; right foot remains closed at the fifth met head. The area over the base of the left fifth metatarsal has a small open area but considerable undermining towards the plantar foot. Thick callus skin around this suggests an adequate pressure relief. We have talked about this. He says he is going to go back into his cam boot. I suggested a total contact cast he did not seem enamored with this suggestion 10/26; left foot base of the fifth metatarsal. Same condition as last time. He has skin over the area with an open wound however the skin is not adherent. He went  to see Dr. Earleen Newport who did an x-ray and culture of his foot I have not reviewed the x-ray but the patient was not told anything. He is on doxycycline 11/11; since the patient was last here he was in the emergency room on 10/30 he was concerned about  swelling in the left foot. They did not do any cultures or x-rays. They changed his antibiotics to cephalexin. Previous culture showed group B strep. The cephalexin is appropriate as doxycycline has less than predictable coverage. Arrives in clinic today with swelling over this area under the wound. He also has a new wound on the right fifth metatarsal head 11/18; the patient has a difficult wound on the lateral aspect of the left fifth metatarsal head. The wound was almost ballotable last week I opened it slightly expecting to see purulence however there was just bleeding. I cultured this this was negative. X-ray unchanged. We are trying to get an MRI but I am not sure were going to be able to get this through his insurance. He also has an area on the right lateral fifth metatarsal head this looks healthier 12/3; the patient finally got our MRI. Surprisingly this did not show osteomyelitis. I did show the soft tissue ulceration at the lateral plantar aspect of the fifth metatarsal base with a tiny residual 6 mm abscess overlying the superficial fascia I have tried to culture this area I have not been able to get this to grow anything. Nevertheless the protruding tissue looks aggravated. I suspect we should try to treat the underlying "abscess with broad-spectrum antibiotics. I am going to start him on Levaquin and Flagyl. He has much less edema in his legs and I am going to continue to wrap his legs and see him weekly 12/10. I started Levaquin and Flagyl on him last week. He just picked up the Flagyl apparently there was some delay. The worry is the wound on the left fifth metatarsal base which is substantial and worsening. His foot looks like he inverts at the ankle making this a weightbearing surface. Certainly no improvement in fact I think the measurements of this are somewhat worse. We have been using 12/17; he apparently just got the Levaquin yesterday this is 2 weeks after the fact. He has  completed the Flagyl. The area over the left fifth metatarsal base still has protruding granulation tissue although it does not look quite as bad as it did some weeks ago. He has severe bilateral lymphedema although we have not been treating him for wounds on his legs this is definitely going to require compression. There was so much edema in the left I did not wish to put him in a total contact cast today. I am going to increase his compression from 3-4 layer. The area on the right lateral fifth met head actually look quite good and superficial. 12/23; patient arrived with callus on the right fifth met head and the substantial hyper granulated callused wound on the base of his fifth metatarsal. He says he is completing his Levaquin in 2 days but I do not think that adds up with what I gave him but I will have to double check this. We are using Hydrofera Blue on both areas. My plan is to put the left leg in a cast the week after New Year's 04/06/2020; patient's wounds about the same. Right lateral fifth metatarsal head and left lateral foot over the base of the fifth metatarsal. There is undermining on the left lateral foot  which I removed before application of total contact cast continuing with Hydrofera Blue new. Patient tells me he was seen by endocrinology today lab work was done [Dr. Kerr]. Also wondering whether he was referred to cardiology. I went over some lab work from previously does not have chronic renal failure certainly not nephrotic range proteinuria he does have very poorly controlled diabetes but this is not his most updated lab work. Hemoglobin A1c has been over 11 1/10; the patient had a considerable amount of leakage towards mid part of his left foot with macerated skin however the wound surface looks better the area on the right lateral fifth met head is better as well. I am going to change the dressing on the left foot under the total contact cast to silver alginate, continue with  Hydrofera Blue on the right. 1/20; patient was in the total contact cast for 10 days. Considerable amount of drainage although the skin around the wound does not look too bad on the left foot. The area on the right fifth metatarsal head is closed. Our nursing staff reports large amount of drainage out of the left lateral foot wound 1/25; continues with copious amounts of drainage described by our intake staff. PCR culture I did last week showed E. coli and Enterococcus faecalis and low quantities. Multiple resistance genes documented including extended spectrum beta lactamase, MRSA, MRSE, quinolone, tetracycline. The wound is not quite as good this week as it was 5 days ago but about the same size 2/3; continues with copious amounts of malodorous drainage per our intake nurse. The PCR culture I did 2 weeks ago showed E. coli and low quantities of Enterococcus. There were multiple resistance genes detected. I put Neosporin on him last week although this does not seem to have helped. The wound is slightly deeper today. Offloading continues to be an issue here although with the amount of drainage she has a total contact cast is just not going to work 2/10; moderate amount of drainage. Patient reports he cannot get his stocking on over the dressing. I told him we have to do that the nurse gave him suggestions on how to make this work. The wound is on the bottom and lateral part of his left foot. Is cultured predominantly grew low amounts of Enterococcus, E. coli and anaerobes. There were multiple resistance genes detected including extended spectrum beta lactamase, quinolone, tetracycline. I could not think of an easy oral combination to address this so for now I am going to do topical antibiotics provided by [facility] I think the main agents here are vancomycin and an aminoglycoside. We have to be able to give him access to the wounds to get the topical antibiotic on 2/17; moderate amount of drainage this  is unchanged. He has his Keystone topical antibiotic against the deep tissue culture organisms. He has been using this and changing the dressing daily. Silver alginate on the wound surface. 2/24; using Keystone antibiotic with silver alginate on the top. He had too much drainage for a total contact cast at one point although I think that is improving and I think in the next week or 2 it might be possible to replace a total contact cast I did not do this today. In general the wound surface looks healthy however he continues to have thick rims of skin and subcutaneous tissue around the wide area of the circumference which I debrided 06/04/2020 upon evaluation today patient appears to be doing well in regard to his wound.  I do feel like he is showing signs of improvement. There is little bit of callus and dead tissue around the edges of the wound as well as what appears to be a little bit of a sinus tract that is off to the side laterally I would perform debridement to clear that away today. 3/17; left lateral foot. The wound looks about the same as I remember. Not much depth surface looks healthy. No evidence of infection 3/25; left lateral foot. Wound surface looks about the same. Separating epithelium from the circumference. There really is no evidence of infection here however not making progress by my view 3/29; left lateral foot. Surface of the wound again looks reasonably healthy still thick skin and subcutaneous tissue around the wound margins. There is no evidence of infection. One of the concerns being brought up by the nurses has again the amount of drainage vis--vis continued use of a total contact cast 4/5; left lateral foot at roughly the base of the fifth metatarsal. Nice healthy looking granulated tissue with rims of epithelialization. The overall wound measurements are not any better but the tissue looks healthy. The only concern is the amount of drainage although he has no surrounding  maceration with what we have been doing recently to absorb fluid and protect his skin. He also has lymphedema. 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 He states that the kerlix/coban under the TCC rolled down and could not get it back up. He has been using an offloading scooter and has somehow hurt his right foot using this device. This happened last week. He states that the side of his right foot developed a blister and opened. The tope of his foot also has a few small open wounds he thinks is due to his socks rubbing in his shoes. He has not been using any dressings to the wound. He denies purulent drainage, fever/chills or erythema to the wounds. Electronic Signature(s) Signed: 07/14/2020 5:15:42 PM By: [name] DO Entered By: [name] on 07/14/2020 16:59:46 -------------------------------------------------------------------------------- Physical Exam Details Patient Name: Date of Service: [name], [name] 07/14/2020 2:15 PM Medical Record Number: [id number] Patient Account Number: [id number] Date of Birth/Sex: Treating RN: 12-09-1985 (35 y.o. [name]: [name] Other Clinician: Referring Provider: Treating Provider/Extender: [name] in Treatment: 37 Constitutional respirations regular, non-labored and within target range for patient.Marland Kitchen Psychiatric pleasant and cooperative. Notes left plantar foot: there is circumferential callous. Healthy granulation tissue is present. Peripheral pulses are palpable Right lateral foot: subcutaneous tissue exposed with surrounding callous. No drainage noted right dorsum of foot: small punctate lesions with minimal  drainage. Appear to almost be epithelialized Electronic Signature(s) Signed: 07/14/2020 5:15:42 PM By: [name] DO Entered By: [name] on 07/14/2020 16:59:58 -------------------------------------------------------------------------------- Physician Orders Details Patient Name: Date of Service: [name], [name] 07/14/2020 2:15 PM Medical Record Number: [id number] Patient Account Number: [id number] Date of Birth/Sex: Treating RN: 03-21-1986 (35 y.o. [name] Primary Care Provider: [name] Other Clinician: Referring Provider: Treating Provider/Extender: [name] in Treatment: [phone] Verbal / Phone Orders: No Diagnosis Coding ICD-10 Coding Code Description E11.621 Type 2 diabetes mellitus with foot ulcer L97.528 Non-pressure chronic  ulcer of other part of left foot with other specified severity L97.518 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ity Follow-up Appointments Return Appointment in 1 week. Bathing/ Shower/ Hygiene May shower with protection but do not get wound dressing(s) wet. - use a cast protector to left leg. May shower and wash wound with soap and water. - with dressing changes only to right foot. Edema Control - Lymphedema / SCD / Other Bilateral Lower Extremities Elevate legs to the level of the heart or above for 30 minutes daily and/or when sitting, a frequency of: - throughout the day Avoid standing for long periods of time. Exercise regularly Moisturize legs daily. - right leg every night before bed. Compression stocking or Garment 20-30 mm/Hg pressure to: - Apply to right leg in the morning and remove at night. Off-Loading Total Contact Cast to Left Lower Extremity Wound Treatment Wound #3 - Foot Wound Laterality: Left, Lateral Cleanser: Soap and Water 1 x Per Week/30 Days Discharge Instructions: May shower and wash wound with dial antibacterial soap and water prior  to dressing change. Peri-Wound Care: Zinc Oxide Ointment 30g tube 1 x Per Week/30 Days Discharge Instructions: Apply Zinc Oxide to periwound with each dressing change as needed. Prim Dressing: Hydrofera Blue Classic Foam, 4x4 in (DME) (Generic) 1 x Per Week/30 Days ary Discharge Instructions: Moisten with saline prior to applying to wound bed Secondary Dressing: Woven Gauze Sponge, Non-Sterile 4x4 in 1 x Per Week/30 Days Discharge Instructions: Apply over primary dressing as directed. Secondary Dressing: ABD Pad, 8x10 1 x Per Week/30 Days Discharge Instructions: T down before applying kerlix. Apply over primary dressing as directed. ape Secondary Dressing: Zetuvit Plus 4x8 in 1 x Per Week/30 Days Discharge Instructions: Apply over primary dressing as directed. Secured With: [employer], 4.5x3.1 (in/yd) 1 x Per Week/30 Days Discharge Instructions: Secure with Kerlix as directed. Add-Ons: 1 x Per Week/30 Days Wound #5 - Metatarsal head fifth Wound Laterality: Right, Lateral Cleanser: Soap and Water Every Other Day/30 Days Discharge Instructions: May shower and wash wound with dial antibacterial soap and water prior to dressing change. Cleanser: Byram Ancillary Kit - 15 Day Supply (DME) (Generic) Every Other Day/30 Days Discharge Instructions: Use supplies as instructed; Kit contains: (15) Saline Bullets; (15) 3x3 Gauze; 15 pr Gloves Electronic Signature(s) Signed: 07/14/2020 5:15:42 PM By: [name] DO Entered By: [name] on 07/14/2020 17:00:13 -------------------------------------------------------------------------------- Problem List Details Patient Name: Date of Service: [name], [name] 07/14/2020 2:15 PM Medical Record Number: [id number] Patient Account Number: [id number] Date of Birth/Sex: Treating RN: 06/23/1985 (35 y.o. [name], [name] Primary Care Provider: [name] Other Clinician: Referring Provider: Treating Provider/Extender: [name] in Treatment: 37 Active Problems ICD-10 Encounter Code Description Active Date MDM Diagnosis E11.621 Type 2 diabetes mellitus with foot ulcer 10/24/2019 No Yes L97.528 Non-pressure chronic ulcer of other part of left foot with other specified 10/24/2019 No Yes severity L97.518 Non-pressure chronic ulcer of other part of right foot with other specified 07/14/2020 No Yes severity Inactive Problems ICD-10 Code Description Active Date Inactive Date L97.518 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ity 10/24/2019 10/24/2019 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, right ankle and foot 10/24/2019 10/24/2019 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 He comes in for review of a wound over the plantar fifth metatarsal head extending into the lateral part of the foot. He was followed for this previously by his podiatrist Dr. Cornelius Moras. As the patient tells his story he went to see podiatry first for a swelling he developed on the lateral part of his fifth metatarsal head in May. He states this was "open" by podiatry and the area closed. He was followed up in June and it was again opened callus removed and it closed promptly. There were plans being made for surgery on the fifth metatarsal head in June however his blood sugar was apparently too high for anesthesia. Apparently the area was debrided and opened again in June and it is never closed since. Looking over the records from podiatry I am really not able to follow this. It was clear when he was first seen it was before 5/14 at that point he already had a wound. By 5/17 the ulcer was resolved. I do not see anything about a procedure. On 5/28 noted to have pre-ulcerative moderate keratosis. X-ray noted 1/5 contracted toe and tailor's bunion and metatarsal deformity. On a visit date on 09/28/2018 the dorsal part of the left foot it healed and resolved. There was concern about swelling in his lower extremity he was sent to the ER.. As far as I can tell he was seen in the ER on 7/12 with an ulcer on his left foot. A DVT rule out of the left leg was negative. I do not think I have complete records from podiatry but I am not able to verify the procedures this patient states he had. He states after the last procedure the wound has never closed although I am not able to follow this in the records I have from podiatry. He has not had a recent x-ray The patient has been using Neosporin on the wound. He is wearing a Darco shoe. He is still very active up on his foot working and exercising. Past medical history; type 2 diabetes  ketosis-prone, leg swelling with a negative DVT study in July. Non-smoker ABI in our clinic was 0.85 on the left 10/16; substantial wound on the plantar left fifth met head extending laterally almost to the dorsal fifth MTP. We have been using silver alginate we gave him a Darco forefoot off loader. An x-ray did not show evidence of osteomyelitis did note soft tissue emphysema which I think was due to gas tracking through an open wound. There is no doubt in my mind he requires an MRI 10/23; MRI not booked until 3 November at the earliest this is largely due to his glucose sensor in the right arm. We have been using silver alginate. There has been  an improvement 10/29; I am still not exactly sure when his MRI is booked for. He says it is the third but it is the 10th in epic. This definitely needs to be done. He is running a low-grade fever today but no other symptoms. No real improvement in the 1 02/26/2019 patient presents today for a follow-up visit here in our clinic he is last been seen in the clinic on October 29. Subsequently we were working on getting MRI to evaluate and see what exactly was going on and where we would need to go from the standpoint of whether or not he had osteomyelitis and again what treatments were going be required. Subsequently the patient ended up being admitted to the hospital on 02/07/2019 and was discharged on 02/14/2019. This is a somewhat interesting admission with a discharge diagnosis of pneumonia due to COVID-19 although he was positive for COVID-19 when tested at the urgent care but negative x2 when he was actually in the hospital. With that being said he did have acute respiratory failure with hypoxia and it was noted he also have a left foot ulceration with osteomyelitis. With that being said he did require oxygen for his pneumonia and I level 4 L. He was placed on antivirals and steroids for the COVID-19. He was also transferred to the [place] at one  point. Nonetheless he did subsequently discharged home and since being home has done much better in that regard. The CT angiogram did not show any pulmonary embolism. With regard to the osteomyelitis the patient was placed on vancomycin and Zosyn while in the hospital but has been changed to Augmentin at discharge. It was also recommended that he follow- up with wound care and podiatry. Podiatry however wanted him to see [name] according to the patient prior to them doing anything further. His hemoglobin A1c was 9.9 as noted in the hospital. Have an MRI of the left foot performed while in the hospital on 02/04/2019. 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 There is an overlying plantar open wound noted an abscess tracking back along the lateral aspect of the fifth metatarsal shaft. There is otherwise diffuse cellulitis and mild fasciitis without findings of polymyositis. The patient did have recently pneumonia secondary to COVID-19 I looked in the chart through epic and it does appear that the patient may need to have an additional x-ray just to ensure everything is cleared and that he has no airspace disease prior to putting him into the chamber. 03/05/2019; patient was readmitted to the clinic last week. He was hospitalized twice for a viral upper respiratory tract infection from 11/1 through 11/4 and then 11/5 through 11/12 ultimately this turned out to be Covid pneumonitis. Although he was discharged on oxygen he is not using it. He says he feels fine. He has no exercise limitation no cough no sputum. His O2 sat in our clinic today was 100% on room air. 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 He received Vanco and Zosyn in the hospital and then was discharged on 2 weeks of Augmentin. I do  not see any relevant cultures. He was supposed to follow-up with infectious disease but I do not see that he has an appointment. 12/8; patient saw Dr. Novella Olive of infectious disease last week. He felt that he had  had adequate antibiotic therapy. He did not go to follow-up with Dr. Amalia Hailey of podiatry and I have again talked to him about the pros and cons of this. He does not want to consider a ray amputation of this time. He is aware of the risks of recurrence, migration etc. He started HBO today and tolerated this well. He can complete the Augmentin that I gave him last week. I have looked over the lab work that Dr. Chana Bode ordered his C-reactive protein was 3.3 and his sedimentation rate was 17. The C-reactive protein is never really been measurably that high in this patient 12/15; not much change in the wound today however he has undermining along the lateral part of the foot again more extensively than last week. He has some rims of epithelialization. We have been using silver alginate. He is undergoing hyperbarics but did not dive today 12/18; in for his obligatory first total contact cast change. Unfortunately there was pus coming from the undermining area around his fifth metatarsal head. This was cultured but will preclude reapplication of a cast. He is seen in conjunction with HBO 12/24; patient had staph lugdunensis in the wound in the undermining area laterally last time. We put him on doxycycline which should have covered this. The wound looks better today. I am going to give him another week of doxycycline before reattempting the total contact cast 12/31; the patient is completing antibiotics. Hemorrhagic debris in the distal part of the wound with some undermining distally. He also had hyper granulation. Extensive debridement with a #5 curette. The infected area that was on the lateral part of the fifth met head is closed over. I do not think he needs any more antibiotics. Patient was seen  prior to HBO. Preparations for a total contact cast were made in the cast will be placed post hyperbarics 04/11/19; once again the patient arrives today without complaint. He had been in a cast all week noted that he had heavy drainage this week. This resulted in large raised areas of macerated tissue around the wound 1/14; wound bed looks better slightly smaller. Hydrofera Blue has been changing himself. He had a heavy drainage last week which caused a lot of maceration around the wound so I took him out of a total contact cast he says the drainage is actually better this week He is seen today in conjunction with HBO 1/21; returns to clinic. He was up in [place] for a day or 2 attending a funeral. He comes back in with the wound larger and with a large area of exposed bone. He had osteomyelitis and septic arthritis of the fifth left metatarsal head while he was in hospital. He received IV antibiotics in the hospital for a prolonged period of time then 3 weeks of Augmentin. Subsequently I gave him 2 weeks of doxycycline for more superficial wound infection. When I saw this last week the wound was smaller the surface of the wound looks satisfactory. 1/28; patient missed hyperbarics today. Bone biopsy I did last time showed Enterococcus faecalis and Staphylococcus lugdunensis . He has a wide area of exposed bone. We are going to use silver alginate as of today. I had another ethical discussion with the patient. 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 In this situation I think the likelihood of healing this is low. Therefore I have recommended a ray amputation and with the patient's agreement I have referred him to Dr. Doran Durand. The other issue is that his  compliance with hyperbarics has been minimal because of his work schedule and given his underlying decision I am going to stop this today READMISSION 10/24/2019 MRI 09/29/2019 left foot IMPRESSION: 1. Apparent skin  ulceration inferior and lateral to the 5th metatarsal base with underlying heterogeneous T2 signal and enhancement in the subcutaneous fat. Small peripherally enhancing fluid collections along the plantar and lateral aspects of the 5th metatarsal base suspicious for abscesses. 2. Interval amputation through the mid 5th metatarsal with nonspecific low-level marrow edema and enhancement. Given the proximity to the adjacent soft tissue inflammatory changes, osteomyelitis cannot be excluded. 3. The additional bones appear unremarkable. MRI 09/29/2019 right foot IMPRESSION: 1. Soft tissue ulceration lateral to the 5th MTP joint. There is low-level T2 hyperintensity within the 4th and 5th metatarsal heads and adjacent proximal phalanges without abnormal T1 signal or cortical destruction. These findings are nonspecific and could be seen with early marrow edema, hyperemia or early osteomyelitis. No evidence of septic joint. 2. Mild tenosynovitis and synovial enhancement associated with the extensor digitorum tendons at the level of the midfoot. 3. Diffuse low-level muscular T2 hyperintensity and enhancement, most consistent with diabetic myopathy. LEFT FOOT BONE Methicillin resistant staphylococcus aureus Staphylococcus lugdunensis MIC MIC CIPROFLOXACIN >=8 RESISTANT Resistant <=0.5 SENSI... Sensitive CLINDAMYCIN <=0.25 SENS... Sensitive >=8 RESISTANT Resistant ERYTHROMYCIN >=8 RESISTANT Resistant >=8 RESISTANT Resistant GENTAMICIN <=0.5 SENSI... Sensitive <=0.5 SENSI... Sensitive Inducible Clindamycin NEGATIVE Sensitive NEGATIVE Sensitive OXACILLIN >=4 RESISTANT Resistant 2 SENSITIVE Sensitive RIFAMPIN <=0.5 SENSI... Sensitive <=0.5 SENSI... Sensitive TETRACYCLINE <=1 SENSITIVE Sensitive <=1 SENSITIVE Sensitive TRIMETH/SULFA <=10 SENSIT Sensitive <=10 SENSIT Sensitive ... Marland Kitchen.. VANCOMYCIN 1 SENSITIVE Sensitive <=0.5 SENSI... Sensitive Right foot bone . Component 3 wk ago Specimen  Description BONE Special Requests RIGHT 4 METATARSAL SAMPLE B Gram Stain NO WBC SEEN NO ORGANISMS SEEN Culture RARE METHICILLIN RESISTANT STAPHYLOCOCCUS AUREUS NO ANAEROBES ISOLATED Performed at [facility], [place] [address] [place], [place] [address] Report Status 10/08/2019 FINAL Organism ID, Bacteria METHICILLIN RESISTANT STAPHYLOCOCCUS AUREUS Resulting Agency [facility] Susceptibility Methicillin resistant staphylococcus aureus MIC CIPROFLOXACIN >=8 RESISTANT Resistant CLINDAMYCIN <=0.25 SENS... Sensitive ERYTHROMYCIN >=8 RESISTANT Resistant GENTAMICIN <=0.5 SENSI... Sensitive Inducible Clindamycin NEGATIVE Sensitive OXACILLIN >=4 RESISTANT Resistant RIFAMPIN <=0.5 SENSI... Sensitive TETRACYCLINE <=1 SENSITIVE Sensitive TRIMETH/SULFA <=10 SENSIT Sensitive ... VANCOMYCIN 1 SENSITIVE Sensitive This is a patient we had in clinic earlier this year with a wound over his left fifth metatarsal head. 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 Doran Durand on 05/16/2019. He seems to have developed problems with wounds on his bilateral feet in June 2021 although he may have had problems earlier than this. He was in an urgent care with a right foot ulcer on 09/26/2019 and given a course of doxycycline. This was apparently after having trouble getting into see orthopedics. He was seen by podiatry on 09/28/2019 noted to have bilateral lower extremity ulcers including the left lateral fifth metatarsal base and the right subfifth met head. It was noted that had purulent drainage at that time. He required hospitalization from 6/20 through 7/2. This was because of worsening right foot wounds. He underwent  bilateral operative incision and drainage and bone biopsies bilaterally. Culture results are listed above. He has been referred back to clinic by Dr. Jacqualyn Posey of podiatry.  He is also followed by Dr. Megan Salon who saw him yesterday. He was discharged from hospital on Zyvox Flagyl and Levaquin and yesterday changed to doxycycline Flagyl and Levaquin. His inflammatory markers on 6/26 showed a sedimentation rate of 129 and a C-reactive protein of 5. This is improved to 14 and 1.3 respectively. This would indicate improvement. ABIs in our clinic today were 1.23 on the right and 1.20 on the left 11/01/2019 on evaluation today patient appears to be doing fairly well in regard to the wounds on his feet at this point. Fortunately there is no signs of active infection at this time. No fevers, chills, nausea, vomiting, or diarrhea. He currently is seeing infectious disease and still under their care at this point. Subsequently he also has both wounds which she has not been using collagen on as he did not receive that in his packaging he did not call us and let us know that. Apparently that just was missed on the order. Nonetheless we will get that straightened out today. 8/9-Patient returns for bilateral foot wounds, using Prisma with hydrogel moistened dressings, and the wounds appear stable. Patient using surgical shoes, avoiding much pressure or weightbearing as much as possible 8/16; patient has bilateral foot wounds. 1 on the right lateral foot proximally the other is on the left mid lateral foot. Both required debridement of callus and thick skin around the wounds. We have been using silver collagen 8/27; patient has bilateral lateral foot wounds. The area on the left substantially surrounded by callus and dry skin. This was removed from the wound edge. The underlying wound is small. The area on the right measured somewhat smaller today. We've been using silver collagen the patient was on antibiotics for  underlying osteomyelitis in the left foot. Unfortunately I did not update his antibiotics during today's visit. 9/10 I reviewed Dr. Hale Bogus last notes he felt he had completed antibiotics his inflammatory markers were reasonably well controlled. He has a small wound on the lateral left foot and a tiny area on the right which is just above closed. He is using Hydrofera Blue with border foam he has bilateral surgical shoes 9/24; 2 week f/u. doing well. right foot is closed. left foot still undermined. 10/14; right foot remains closed at the fifth met head. The area over the base of the left fifth metatarsal has a small open area but considerable undermining towards the plantar foot. Thick callus skin around this suggests an adequate pressure relief. We have talked about this. He says he is going to go back into his cam boot. I suggested a total contact cast he did not seem enamored with this suggestion 10/26; left foot base of the fifth metatarsal. Same condition as last time. He has skin over the area with an open wound however the skin is not adherent. He went to see Dr. Earleen Newport who did an x-ray and culture of his foot I have not reviewed the x-ray but the patient was not told anything. He is on doxycycline 11/11; since the patient was last here he was in the emergency room on 10/30 he was concerned about swelling in the left foot. They did not do any cultures or x-rays. They changed his antibiotics to cephalexin. Previous culture showed group B strep. The cephalexin is appropriate as doxycycline has less than predictable coverage. Arrives in clinic today with swelling over this area under the wound. He also has a new wound on the right fifth metatarsal head 11/18; the patient has a  difficult wound on the lateral aspect of the left fifth metatarsal head. The wound was almost ballotable last week I opened it slightly expecting to see purulence however there was just bleeding. I cultured this this was  negative. X-ray unchanged. We are trying to get an MRI but I am not sure were going to be able to get this through his insurance. He also has an area on the right lateral fifth metatarsal head this looks healthier 12/3; the patient finally got our MRI. Surprisingly this did not show osteomyelitis. I did show the soft tissue ulceration at the lateral plantar aspect of the fifth metatarsal base with a tiny residual 6 mm abscess overlying the superficial fascia I have tried to culture this area I have not been able to get this to grow anything. Nevertheless the protruding tissue looks aggravated. I suspect we should try to treat the underlying "abscess with broad-spectrum antibiotics. I am going to start him on Levaquin and Flagyl. He has much less edema in his legs and I am going to continue to wrap his legs and see him weekly 12/10. I started Levaquin and Flagyl on him last week. He just picked up the Flagyl apparently there was some delay. The worry is the wound on the left fifth metatarsal base which is substantial and worsening. His foot looks like he inverts at the ankle making this a weightbearing surface. Certainly no improvement in fact I think the measurements of this are somewhat worse. We have been using 12/17; he apparently just got the Levaquin yesterday this is 2 weeks after the fact. He has completed the Flagyl. The area over the left fifth metatarsal base still has protruding granulation tissue although it does not look quite as bad as it did some weeks ago. He has severe bilateral lymphedema although we have not been treating him for wounds on his legs this is definitely going to require compression. There was so much edema in the left I did not wish to put him in a total contact cast today. I am going to increase his compression from 3-4 layer. The area on the right lateral fifth met head actually look quite good and superficial. 12/23; patient arrived with callus on the right fifth  met head and the substantial hyper granulated callused wound on the base of his fifth metatarsal. He says he is completing his Levaquin in 2 days but I do not think that adds up with what I gave him but I will have to double check this. We are using Hydrofera Blue on both areas. My plan is to put the left leg in a cast the week after New Year's 04/06/2020; patient's wounds about the same. Right lateral fifth metatarsal head and left lateral foot over the base of the fifth metatarsal. There is undermining on the left lateral foot which I removed before application of total contact cast continuing with Hydrofera Blue new. Patient tells me he was seen by endocrinology today lab work was done [Dr. Kerr]. Also wondering whether he was referred to cardiology. I went over some lab work from previously does not have chronic renal failure certainly not nephrotic range proteinuria he does have very poorly controlled diabetes but this is not his most updated lab work. Hemoglobin A1c has been over 11 1/10; the patient had a considerable amount of leakage towards mid part of his left foot with macerated skin however the wound surface looks better the area on the right lateral fifth met head is better  as well. I am going to change the dressing on the left foot under the total contact cast to silver alginate, continue with Hydrofera Blue on the right. 1/20; patient was in the total contact cast for 10 days. Considerable amount of drainage although the skin around the wound does not look too bad on the left foot. The area on the right fifth metatarsal head is closed. Our nursing staff reports large amount of drainage out of the left lateral foot wound 1/25; continues with copious amounts of drainage described by our intake staff. PCR culture I did last week showed E. coli and Enterococcus faecalis and low quantities. Multiple resistance genes documented including extended spectrum beta lactamase, MRSA, MRSE, quinolone,  tetracycline. The wound is not quite as good this week as it was 5 days ago but about the same size 2/3; continues with copious amounts of malodorous drainage per our intake nurse. The PCR culture I did 2 weeks ago showed E. coli and low quantities of Enterococcus. There were multiple resistance genes detected. I put Neosporin on him last week although this does not seem to have helped. The wound is slightly deeper today. Offloading continues to be an issue here although with the amount of drainage she has a total contact cast is just not going to work 2/10; moderate amount of drainage. Patient reports he cannot get his stocking on over the dressing. I told him we have to do that the nurse gave him suggestions on how to make this work. The wound is on the bottom and lateral part of his left foot. Is cultured predominantly grew low amounts of Enterococcus, E. coli and anaerobes. There were multiple resistance genes detected including extended spectrum beta lactamase, quinolone, tetracycline. I could not think of an easy oral combination to address this so for now I am going to do topical antibiotics provided by [facility] I think the main agents here are vancomycin and an aminoglycoside. We have to be able to give him access to the wounds to get the topical antibiotic on 2/17; moderate amount of drainage this is unchanged. He has his Keystone topical antibiotic against the deep tissue culture organisms. He has been using this and changing the dressing daily. Silver alginate on the wound surface. 2/24; using Keystone antibiotic with silver alginate on the top. He had too much drainage for a total contact cast at one point although I think that is improving and I think in the next week or 2 it might be possible to replace a total contact cast I did not do this today. In general the wound surface looks healthy however he continues to have thick rims of skin and subcutaneous tissue around the wide area of the  circumference which I debrided 06/04/2020 upon evaluation today patient appears to be doing well in regard to his wound. I do feel like he is showing signs of improvement. There is little bit of callus and dead tissue around the edges of the wound as well as what appears to be a little bit of a sinus tract that is off to the side laterally I would perform debridement to clear that away today. 3/17; left lateral foot. The wound looks about the same as I remember. Not much depth surface looks healthy. No evidence of infection 3/25; left lateral foot. Wound surface looks about the same. Separating epithelium from the circumference. There really is no evidence of infection here however not making progress by my view 3/29; left lateral foot. Surface of  the wound again looks reasonably healthy still thick skin and subcutaneous tissue around the wound margins. There is no evidence of infection. One of the concerns being brought up by the nurses has again the amount of drainage vis--vis continued use of a total contact cast 4/5; left lateral foot at roughly the base of the fifth metatarsal. Nice healthy looking granulated tissue with rims of epithelialization. The overall wound measurements are not any better but the tissue looks healthy. The only concern is the amount of drainage although he has no surrounding maceration with what we have been doing recently to absorb fluid and protect his skin. He also has lymphedema. 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 He states that the kerlix/coban under the TCC rolled down and could not get it back up. He has been using an offloading scooter and has somehow hurt his right foot using this device. This happened last  week. He states that the side of his right foot developed a blister and opened. The tope of his foot also has a few small open wounds he thinks is due to his socks rubbing in his shoes. He has not been using any dressings to the wound. He denies purulent drainage, fever/chills or erythema to the wounds. Patient History Information obtained from Patient. Family History No family history of Cancer, Diabetes, Hereditary Spherocytosis, Hypertension, Kidney Disease, Lung Disease, Seizures, Stroke, Thyroid Problems, Tuberculosis. Social History Never smoker, Marital Status - Single, Alcohol Use - Rarely, Drug Use - No History, Caffeine Use - Never. Medical History Eyes Denies history of Cataracts, Glaucoma, Optic Neuritis Ear/Nose/Mouth/Throat Denies history of Chronic sinus problems/congestion, Middle ear problems Hematologic/Lymphatic Denies history of Anemia, Hemophilia, Human Immunodeficiency Virus, Lymphedema, Sickle Cell Disease Respiratory Denies history of Aspiration, Asthma, Chronic Obstructive Pulmonary Disease (COPD), Pneumothorax, Sleep Apnea, Tuberculosis Cardiovascular Denies history of Angina, Arrhythmia, Congestive Heart Failure, Coronary Artery Disease, Deep Vein Thrombosis, Hypertension, Hypotension, Myocardial Infarction, Peripheral Arterial Disease, Peripheral Venous Disease, Phlebitis, Vasculitis Gastrointestinal Denies history of Cirrhosis , Colitis, Crohnoos, Hepatitis A, Hepatitis B, Hepatitis C Endocrine Patient has history of Type II Diabetes Denies history of Type I Diabetes Immunological Denies history of Lupus Erythematosus, Raynaudoos, Scleroderma Integumentary (Skin) Denies history of History of Burn Musculoskeletal Denies history of Gout, Rheumatoid Arthritis, Osteoarthritis, Osteomyelitis Neurologic Denies history of Dementia, Neuropathy, Quadriplegia, Paraplegia, Seizure Disorder Oncologic Denies history of Received Chemotherapy, Received  Radiation Psychiatric Denies history of Anorexia/bulimia, Confinement Anxiety Hospitalization/Surgery History - 11/1-11/06/2018- sepsis foot infection. - 11/4-11/5 02 sats low respiratory distress. Objective Constitutional respirations regular, non-labored and within target range for patient.. Vitals Time Taken: 3:22 PM, Height: 77 in, Source: Stated, Weight: 280 lbs, Source: Stated, BMI: 33.2, Temperature: 98.4 F, Pulse: 101 bpm, Respiratory Rate: 18 breaths/min, Blood Pressure: 116/78 mmHg, Capillary Blood Glucose: 120 mg/dl. Psychiatric pleasant and cooperative. General Notes: left plantar foot: there is circumferential callous. Healthy granulation tissue is present. Peripheral pulses are palpable Right lateral foot: subcutaneous tissue exposed with surrounding callous. No drainage noted right dorsum of foot: small punctate lesions with minimal drainage. Appear to almost be epithelialized Integumentary (Hair, Skin) Wound #3 status is Open. Original cause of wound was Trauma. The date acquired was: 10/02/2019. The wound has been in treatment 37 weeks. The  wound is located on the Left,Lateral Foot. The wound measures 3cm length x 2.7cm width x 0.1cm depth; 6.362cm^2 area and 0.636cm^3 volume. There is Fat Layer (Subcutaneous Tissue) exposed. There is no tunneling noted, however, there is undermining starting at 11:00 and ending at 1:00 with a maximum distance of 0.3cm. There is a large amount of serosanguineous drainage noted. The wound margin is flat and intact. There is large (67-100%) red granulation within the wound bed. There is no necrotic tissue within the wound bed. Wound #5 status is Open. Original cause of wound was Gradually Appeared. The date acquired was: 07/07/2020. The wound is located on the Right,Lateral Metatarsal head fifth. The wound measures 2.8cm length x 4cm width x 0.5cm depth; 8.796cm^2 area and 4.398cm^3 volume. There is Fat Layer (Subcutaneous Tissue) exposed. There is  no tunneling noted, however, there is undermining starting at 5:00 and ending at 12:00 with a maximum distance of 0.8cm. There is a medium amount of serosanguineous drainage noted. The wound margin is well defined and not attached to the wound base. There is medium (34-66%) red granulation within the wound bed. There is a small (1-33%) amount of necrotic tissue within the wound bed including Adherent Slough. Wound #6 status is Open. Original cause of wound was Gradually Appeared. The date acquired was: 06/02/2020. The wound is located on the Right,Anterior Ankle. The wound measures 0.4cm length x 0.8cm width x 0.2cm depth; 0.251cm^2 area and 0.05cm^3 volume. There is Fat Layer (Subcutaneous Tissue) exposed. There is no tunneling or undermining noted. There is a medium amount of serosanguineous drainage noted. The wound margin is distinct with the outline attached to the wound base. There is medium (34-66%) red granulation within the wound bed. There is a medium (34-66%) amount of necrotic tissue within the wound bed including Adherent Slough. 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 Today the wound has shown improvement. There was circumferential callus and this was debrided. He has been using hydrofera blue under the cast and we will continue with this treatment. The right lateral and dorsum of the foot now have wounds due to trauma from his scooter. I am concerned based on the boggy appearance they have been there for longer than a week. No signs of infeciton. Will try silver collegen every other day. Procedures Wound #3 Pre-procedure diagnosis of Wound #3 is a Diabetic Wound/Ulcer of the Lower Extremity located on  the Left,Lateral Foot .Severity of Tissue Pre Debridement is: Fat layer exposed. There was a Selective/Open Wound Non-Viable Tissue Debridement with a total area of 1.5 sq cm performed by [name], DO. With the following instrument(s): Curette to remove Non-Viable tissue/material. Material removed includes Callus after achieving pain control using Lidocaine 4% T opical Solution. A time out was conducted at 16:05, prior to the start of the procedure. A Moderate amount of bleeding was controlled with Pressure. The procedure was tolerated well with a pain level of 0 throughout and a pain level of 0 following the procedure. Post Debridement Measurements: 3cm length x 2.7cm width x 0.2cm depth; 1.272cm^3 volume. Character of Wound/Ulcer Post Debridement is improved. Severity of Tissue Post Debridement is: Fat layer exposed. Post procedure Diagnosis Wound #3: Same as Pre-Procedure Pre-procedure diagnosis of Wound #  3 is a Diabetic Wound/Ulcer of the Lower Extremity located on the Left,Lateral Foot . There was a T Programmer, multimedia otal Procedure by [name], DO. Post procedure Diagnosis Wound #3: Same as Pre-Procedure Wound #5 Pre-procedure diagnosis of Wound #5 is a Diabetic Wound/Ulcer of the Lower Extremity located on the Right,Lateral Metatarsal head fifth .Severity of Tissue Pre Debridement is: Fat layer exposed. There was a Excisional Skin/Subcutaneous Tissue Debridement with a total area of 12 sq cm performed by [name], DO. With the following instrument(s): Curette, Forceps, and Scissors to remove Viable and Non-Viable tissue/material. Material removed includes Callus, Subcutaneous Tissue, Skin: Dermis, Skin: Epidermis, and Fibrin/Exudate after achieving pain control using Lidocaine 4% T opical Solution. A time out was conducted at 16:05, prior to the start of the procedure. A Moderate amount of bleeding was controlled with Pressure. The procedure was tolerated well with a pain  level of 0 throughout and a pain level of 0 following the procedure. Post Debridement Measurements: 3cm length x 4cm width x 0.5cm depth; 4.712cm^3 volume. Character of Wound/Ulcer Post Debridement is improved. Severity of Tissue Post Debridement is: Fat layer exposed. Post procedure Diagnosis Wound #5: Same as Pre-Procedure Wound #6 Pre-procedure diagnosis of Wound #6 is a Diabetic Wound/Ulcer of the Lower Extremity located on the Right,Anterior Ankle .Severity of Tissue Pre Debridement is: Fat layer exposed. There was a Chemical/Enzymatic/Mechanical debridement performed by Baruch Gouty, RN.. Other agent used was gauze and wound cleanser. There was no bleeding. The procedure was tolerated well. Post Debridement Measurements: 0.4cm length x 0.8cm width x 0.2cm depth; 0.05cm^3 volume. Character of Wound/Ulcer Post Debridement is improved. Severity of Tissue Post Debridement is: Fat layer exposed. Post procedure Diagnosis Wound #6: Same as Pre-Procedure Plan Follow-up Appointments: Return Appointment in 1 week. Bathing/ Shower/ Hygiene: May shower with protection but do not get wound dressing(s) wet. - use a cast protector to left leg. May shower and wash wound with soap and water. - with dressing changes only to right foot. Edema Control - Lymphedema / SCD / Other: Elevate legs to the level of the heart or above for 30 minutes daily and/or when sitting, a frequency of: - throughout the day Avoid standing for long periods of time. Exercise regularly Moisturize legs daily. - right leg every night before bed. Compression stocking or Garment 20-30 mm/Hg pressure to: - Apply to right leg in the morning and remove at night. Off-Loading: T Contact Cast to Left Lower Extremity otal WOUND #3: - Foot Wound Laterality: Left, Lateral Cleanser: Soap and Water 1 x Per Week/30 Days Discharge Instructions: May shower and wash wound with dial antibacterial soap and water prior to dressing  change. Peri-Wound Care: Zinc Oxide Ointment 30g tube 1 x Per Week/30 Days Discharge Instructions: Apply Zinc Oxide to periwound with each dressing change as needed. Prim Dressing: Hydrofera Blue Classic Foam, 4x4 in (DME) (Generic) 1 x Per Week/30 Days ary Discharge Instructions: Moisten with saline prior to applying to wound bed Secondary Dressing: Woven Gauze Sponge, Non-Sterile 4x4 in 1 x Per Week/30 Days Discharge Instructions: Apply over primary dressing as directed. Secondary Dressing: ABD Pad, 8x10 1 x Per Week/30 Days Discharge Instructions: T down before applying kerlix. Apply over primary dressing as directed. ape Secondary Dressing: Zetuvit Plus 4x8 in 1 x Per Week/30 Days Discharge Instructions: Apply over primary dressing as directed. Secured With: [employer], 4.5x3.1 (in/yd) 1 x Per Week/30 Days Discharge Instructions: Secure with Kerlix as directed. Add-Ons: 1 x Per Week/30 Days  WOUND #5: - Metatarsal head fifth Wound Laterality: Right, Lateral Cleanser: Soap and Water Every Other Day/30 Days Discharge Instructions: May shower and wash wound with dial antibacterial soap and water prior to dressing change. Cleanser: [employer] Ancillary Kit - 15 Day Supply (DME) (Generic) Every Other Day/30 Days Discharge Instructions: Use supplies as instructed; Kit contains: (15) Saline Bullets; (15) 3x3 Gauze; 15 pr Gloves 1. TCC to the left leg with hydrofera blue 2. Silver collagen to the right dorsum and lateral aspect 3. Follow up in 1 week Electronic Signature(s) Signed: 07/14/2020 5:15:42 PM By: [name] DO Entered By: [name] on 07/14/2020 17:14:05 -------------------------------------------------------------------------------- HxROS Details Patient Name: Date of Service: [name], [name] 07/14/2020 2:15 PM Medical Record Number: [id number] Patient Account Number: [id number] Date of Birth/Sex: Treating RN: 09/04/1985 (35 y.o. [name] Primary  Care Provider: [name] Other Clinician: Referring Provider: Treating Provider/Extender: [name] in Treatment: 40 Information Obtained From Patient Eyes Medical History: Negative for: Cataracts; Glaucoma; Optic Neuritis Ear/Nose/Mouth/Throat Medical History: Negative for: Chronic sinus problems/congestion; Middle ear problems Hematologic/Lymphatic Medical History: Negative for: Anemia; Hemophilia; Human Immunodeficiency Virus; Lymphedema; Sickle Cell Disease Respiratory Medical History: Negative for: Aspiration; Asthma; Chronic Obstructive Pulmonary Disease (COPD); Pneumothorax; Sleep Apnea; Tuberculosis Cardiovascular Medical History: Negative for: Angina; Arrhythmia; Congestive Heart Failure; Coronary Artery Disease; Deep Vein Thrombosis; Hypertension; Hypotension; Myocardial Infarction; Peripheral Arterial Disease; Peripheral Venous Disease; Phlebitis; Vasculitis Gastrointestinal Medical History: Negative for: Cirrhosis ; Colitis; Crohns; Hepatitis A; Hepatitis B; Hepatitis C Endocrine Medical History: Positive for: Type II Diabetes Negative for: Type I Diabetes Time with diabetes: 8 Treated with: Insulin Blood sugar tested every day: No Immunological Medical History: Negative for: Lupus Erythematosus; Raynauds; Scleroderma Integumentary (Skin) Medical History: Negative for: History of Burn Musculoskeletal Medical History: Negative for: Gout; Rheumatoid Arthritis; Osteoarthritis; Osteomyelitis Neurologic Medical History: Negative for: Dementia; Neuropathy; Quadriplegia; Paraplegia; Seizure Disorder Oncologic Medical History: Negative for: Received Chemotherapy; Received Radiation Psychiatric Medical History: Negative for: Anorexia/bulimia; Confinement Anxiety Immunizations Pneumococcal Vaccine: Received Pneumococcal Vaccination: No Implantable Devices None Hospitalization / Surgery History Type of  Hospitalization/Surgery 11/1-11/06/2018- sepsis foot infection 11/4-11/5 02 sats low respiratory distress Family and Social History Cancer: No; Diabetes: No; Hereditary Spherocytosis: No; Hypertension: No; Kidney Disease: No; Lung Disease: No; Seizures: No; Stroke: No; Thyroid Problems: No; Tuberculosis: No; Never smoker; Marital Status - Single; Alcohol Use: Rarely; Drug Use: No History; Caffeine Use: Never; Financial Concerns: No; Food, Clothing or Shelter Needs: No; Support System Lacking: No; Transportation Concerns: No Electronic Signature(s) Signed: 07/14/2020 5:15:42 PM By: [name] DO Signed: 07/16/2020 5:45:48 PM By: Rhae Hammock RN Entered By: [name] on 07/14/2020 16:59:51 -------------------------------------------------------------------------------- Total Contact Cast Details Patient Name: Date of Service: [name], [name] 07/14/2020 2:15 PM Medical Record Number: [id number] Patient Account Number: [id number] Date of Birth/Sex: Treating RN: 1985/05/30 (35 y.o. [name] Primary Care Provider: [name] Other Clinician: Referring Provider: Treating Provider/Extender: [name] in Treatment: [id number] T Contact Cast Applied for Wound Assessment: otal Wound #3 Left,Lateral Foot Performed By: Physician [name], DO Post Procedure Diagnosis Same as Pre-procedure Electronic Signature(s) Signed: 07/14/2020 5:15:42 PM By: [name] DO Signed: 07/14/2020 6:06:00 PM By: Deon Pilling Entered By: Deon Pilling on 07/14/2020 16:10:21 -------------------------------------------------------------------------------- SuperBill Details Patient Name: Date of Service: [name], [name] 07/14/2020 Medical Record Number: [id number] Patient Account Number: [id number] Date of Birth/Sex: Treating RN: 1986/01/13 (35 y.o. 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 L97.518 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ity Facility Procedures CPT4 Code: 24401027 Description: 25366 - DEBRIDE W/O ANES NON SELECT Modifier: Quantity: 1 CPT4 Code: 44034742 I Description: 11042 - DEB SUBQ TISSUE 20 SQ CM/< ICD-10 Diagnosis Description CD-10 Diagnosis Description L97.518 Non-pressure chronic ulcer of other part of right foot with other specified se Modifier: verity Quantity: 1 CPT4 Code: [id number] I Description: 7597 - DEBRIDE WOUND 1ST 20 SQ CM OR < CD-10 Diagnosis Description L97.528 Non-pressure chronic ulcer of other part of left foot with other specified sev Modifier: 1 erity Quantity: Electronic Signature(s) Signed: 07/15/2020 12:28:06 PM By: Deon Pilling Signed: 08/11/2020 3:23:56 PM By: [name] DO Previous Signature: 07/15/2020 12:26:47 PM Version By: Deon Pilling Entered By: Deon Pilling on 07/15/2020 12:28:05

## 2020-08-17 ENCOUNTER — Encounter (HOSPITAL_BASED_OUTPATIENT_CLINIC_OR_DEPARTMENT_OTHER): Payer: BC Managed Care – PPO | Admitting: Internal Medicine

## 2020-08-17 ENCOUNTER — Other Ambulatory Visit: Payer: Self-pay

## 2020-08-17 DIAGNOSIS — E11621 Type 2 diabetes mellitus with foot ulcer: Secondary | ICD-10-CM | POA: Diagnosis not present

## 2020-08-19 NOTE — Progress Notes (Signed)
[name], Max ([id number]) Visit Report for 08/17/2020 Debridement Details Patient Name: Date of Service: [name] 08/17/2020 12:45 PM Medical Record Number: [id number] Patient Account Number: [id number] Date of Birth/Sex: Treating RN: 26-Oct-1985 (35 y.o. [name] Primary Care Provider: [name] Other Clinician: Referring Provider: Treating Provider/Extender: [name] in Treatment: 42 Debridement Performed for Assessment: Wound #3 Left,Lateral Foot Performed By: Physician [name], MD Debridement Type: Debridement Severity of Tissue Pre Debridement: Fat layer exposed Level of Consciousness (Pre-procedure): Awake and Alert Pre-procedure Verification/Time Out Yes - 13:53 Taken: Start Time: 13:53 T Area Debrided (L x W): otal 2.3 (cm) x 2.5 (cm) = 5.75 (cm) Tissue and other material debrided: Viable, Non-Viable, Callus, Subcutaneous Level: Skin/Subcutaneous Tissue Debridement Description: Excisional Instrument: Curette Bleeding: Moderate Hemostasis Achieved: Pressure End Time: 13:55 Procedural Pain: 0 Post Procedural Pain: 0 Response to Treatment: Procedure was tolerated well Level of Consciousness (Post- Awake and Alert procedure): Post Debridement Measurements of Total Wound Length: (cm) 2.3 Width: (cm) 2.5 Depth: (cm) 0.6 Volume: (cm) 2.71 Character of Wound/Ulcer Post Debridement: Stable Severity of Tissue Post Debridement: Fat layer exposed Post Procedure Diagnosis Same as Pre-procedure Electronic Signature(s) Signed: 08/18/2020 4:16:43 PM By: [name] MD Signed: 08/19/2020 6:30:22 PM By: [name] RN, BSN Entered By: [name] on 08/17/2020 14:01:19 -------------------------------------------------------------------------------- HPI Details Patient Name: Date of Service: [name], [name] 08/17/2020 12:45 PM Medical Record Number: [id number] Patient Account Number: [id number] Date of  Birth/Sex: Treating RN: 01/03/1986 (35 y.o. 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 He comes in for review of a wound over the plantar fifth metatarsal head extending into the lateral part of the foot. He was followed for this previously by his podiatrist Dr. Cornelius Moras. As the patient tells his story he went to see podiatry first for a swelling he developed on the lateral part of his fifth metatarsal head in May. He states this was "open" by podiatry and the area closed. He was followed up in June and it was again opened callus removed and it closed promptly. There were plans being made for surgery on the fifth metatarsal head in June however his blood sugar was apparently too high for anesthesia. Apparently the area was debrided and opened again in June and it is never closed since. Looking over the records from podiatry I am really not able to follow this. It was clear when he was first seen it was before 5/14 at that point he already had a wound. By 5/17 the ulcer was resolved. I do not see anything about a procedure. On 5/28 noted to have pre-ulcerative moderate keratosis. X-ray noted 1/5 contracted toe and tailor's bunion and metatarsal deformity. On a visit date on 09/28/2018 the dorsal part of the left foot it healed and resolved. There was concern about swelling in his lower extremity he was sent to the ER.. As far as I can tell he was seen in the ER on 7/12 with an ulcer on his left foot. A DVT rule out of the left leg was negative. I do not think I have complete records from podiatry but I am not able to verify the procedures this patient states he had. He states after the last procedure the wound has never  closed  although I am not able to follow this in the records I have from podiatry. He has not had a recent x-ray The patient has been using Neosporin on the wound. He is wearing a Darco shoe. He is still very active up on his foot working and exercising. Past medical history; type 2 diabetes ketosis-prone, leg swelling with a negative DVT study in July. Non-smoker ABI in our clinic was 0.85 on the left 10/16; substantial wound on the plantar left fifth met head extending laterally almost to the dorsal fifth MTP. We have been using silver alginate we gave him a Darco forefoot off loader. An x-ray did not show evidence of osteomyelitis did note soft tissue emphysema which I think was due to gas tracking through an open wound. There is no doubt in my mind he requires an MRI 10/23; MRI not booked until 3 November at the earliest this is largely due to his glucose sensor in the right arm. We have been using silver alginate. There has been an improvement 10/29; I am still not exactly sure when his MRI is booked for. He says it is the third but it is the 10th in epic. This definitely needs to be done. He is running a low-grade fever today but no other symptoms. No real improvement in the 1 02/26/2019 patient presents today for a follow-up visit here in our clinic he is last been seen in the clinic on October 29. Subsequently we were working on getting MRI to evaluate and see what exactly was going on and where we would need to go from the standpoint of whether or not he had osteomyelitis and again what treatments were going be required. Subsequently the patient ended up being admitted to the hospital on 02/07/2019 and was discharged on 02/14/2019. This is a somewhat interesting admission with a discharge diagnosis of pneumonia due to COVID-19 although he was positive for COVID-19 when tested at the urgent care but negative x2 when he was actually in the hospital. With that being said he did have acute respiratory  failure with hypoxia and it was noted he also have a left foot ulceration with osteomyelitis. With that being said he did require oxygen for his pneumonia and I level 4 L. He was placed on antivirals and steroids for the COVID-19. He was also transferred to the [facility] at one point. Nonetheless he did subsequently discharged home and since being home has done much better in that regard. The CT angiogram did not show any pulmonary embolism. With regard to the osteomyelitis the patient was placed on vancomycin and Zosyn while in the hospital but has been changed to Augmentin at discharge. It was also recommended that he follow- up with wound care and podiatry. Podiatry however wanted him to see [name] according to the patient prior to them doing anything further. His hemoglobin A1c was 9.9 as noted in the hospital. Have an MRI of the left foot performed while in the hospital on 02/04/2019. 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 There is an overlying plantar open wound noted an abscess tracking back along the lateral aspect of the fifth metatarsal shaft. There is otherwise diffuse cellulitis and mild fasciitis without findings of polymyositis. The patient did have recently pneumonia secondary to COVID-19 I looked in the chart through epic and it does appear that the patient may need to have an additional x-ray just to  ensure everything is cleared and that he has no airspace disease prior to putting him into the [name]. 03/05/2019; patient was readmitted to the clinic last week. He was hospitalized twice for a viral upper respiratory tract infection from 11/1 through 11/4 and then 11/5 through 11/12 ultimately this turned out to be Covid pneumonitis. Although he was discharged on oxygen he is not using it. He says he feels fine. He has no exercise limitation no cough no sputum. His O2 sat in our  clinic today was 100% on room air. 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 He received Vanco and Zosyn in the hospital and then was discharged on 2 weeks of Augmentin. I do not see any relevant cultures. He was supposed to follow-up with infectious disease but I do not see that he has an appointment. 12/8; patient saw Dr. Novella Olive of infectious disease last week. He felt that he had had adequate antibiotic therapy. He did not go to follow-up with Dr. Amalia Hailey of podiatry and I have again talked to him about the pros and cons of this. He does not want to consider a ray amputation of this time. He is aware of the risks of recurrence, migration etc. He started HBO today and tolerated this well. He can complete the Augmentin that I gave him last week. I have looked over the lab work that Dr. Chana Bode ordered his C-reactive protein was 3.3 and his sedimentation rate was 17. The C-reactive protein is never really been measurably that high in this patient 12/15; not much change in the wound today however he has undermining along the lateral part of the foot again more extensively than last week. He has some rims of epithelialization. We have been using silver alginate. He is undergoing hyperbarics but did not dive today 12/18; in for his obligatory first total contact cast change. Unfortunately there was pus coming from the undermining area around his fifth metatarsal head. This was cultured but will preclude reapplication of a cast. He is seen in conjunction with HBO 12/24; patient had staph lugdunensis in the wound in the undermining area laterally last time. We put him on doxycycline which should have covered this. The wound looks better today. I am going to give him another week of doxycycline before reattempting the total contact cast 12/31; the patient is completing antibiotics. Hemorrhagic  debris in the distal part of the wound with some undermining distally. He also had hyper granulation. Extensive debridement with a #5 curette. The infected area that was on the lateral part of the fifth met head is closed over. I do not think he needs any more antibiotics. Patient was seen prior to HBO. Preparations for a total contact cast were made in the cast will be placed post hyperbarics 04/11/19; once again the patient arrives today without complaint. He had been in a cast all week noted that he had heavy drainage this week. This resulted in large raised areas of macerated tissue around the wound 1/14; wound bed looks better slightly smaller. Hydrofera Blue has been changing himself. He had a heavy drainage last week which caused a lot of maceration around the wound so I took him out of a total contact cast he says the drainage is actually better this week He is seen today in conjunction with HBO 1/21; returns to clinic. He was up in [place] for a day or 2  attending a funeral. He comes back in with the wound larger and with a large area of exposed bone. He had osteomyelitis and septic arthritis of the fifth left metatarsal head while he was in hospital. He received IV antibiotics in the hospital for a prolonged period of time then 3 weeks of Augmentin. Subsequently I gave him 2 weeks of doxycycline for more superficial wound infection. When I saw this last week the wound was smaller the surface of the wound looks satisfactory. 1/28; patient missed hyperbarics today. Bone biopsy I did last time showed Enterococcus faecalis and Staphylococcus lugdunensis . He has a wide area of exposed bone. We are going to use silver alginate as of today. I had another ethical discussion with the patient. 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 In this situation I think the likelihood of healing this is low. Therefore I have recommended a ray amputation and with the patient's  agreement I have referred him to Dr. Doran Durand. The other issue is that his compliance with hyperbarics has been minimal because of his work schedule and given his underlying decision I am going to stop this today READMISSION 10/24/2019 MRI 09/29/2019 left foot IMPRESSION: 1. Apparent skin ulceration inferior and lateral to the 5th metatarsal base with underlying heterogeneous T2 signal and enhancement in the subcutaneous fat. Small peripherally enhancing fluid collections along the plantar and lateral aspects of the 5th metatarsal base suspicious for abscesses. 2. Interval amputation through the mid 5th metatarsal with nonspecific low-level marrow edema and enhancement. Given the proximity to the adjacent soft tissue inflammatory changes, osteomyelitis cannot be excluded. 3. The additional bones appear unremarkable. MRI 09/29/2019 right foot IMPRESSION: 1. Soft tissue ulceration lateral to the 5th MTP joint. There is low-level T2 hyperintensity within the 4th and 5th metatarsal heads and adjacent proximal phalanges without abnormal T1 signal or cortical destruction. These findings are nonspecific and could be seen with early marrow edema, hyperemia or early osteomyelitis. No evidence of septic joint. 2. Mild tenosynovitis and synovial enhancement associated with the extensor digitorum tendons at the level of the midfoot. 3. Diffuse low-level muscular T2 hyperintensity and enhancement, most consistent with diabetic myopathy. LEFT FOOT BONE Methicillin resistant staphylococcus aureus Staphylococcus lugdunensis MIC MIC CIPROFLOXACIN >=8 RESISTANT Resistant <=0.5 SENSI... Sensitive CLINDAMYCIN <=0.25 SENS... Sensitive >=8 RESISTANT Resistant ERYTHROMYCIN >=8 RESISTANT Resistant >=8 RESISTANT Resistant GENTAMICIN <=0.5 SENSI... Sensitive <=0.5 SENSI... Sensitive Inducible Clindamycin NEGATIVE Sensitive NEGATIVE Sensitive OXACILLIN >=4 RESISTANT Resistant 2 SENSITIVE Sensitive RIFAMPIN  <=0.5 SENSI... Sensitive <=0.5 SENSI... Sensitive TETRACYCLINE <=1 SENSITIVE Sensitive <=1 SENSITIVE Sensitive TRIMETH/SULFA <=10 SENSIT Sensitive <=10 SENSIT Sensitive ... Marland Kitchen.. VANCOMYCIN 1 SENSITIVE Sensitive <=0.5 SENSI... Sensitive Right foot bone . Component 3 wk ago Specimen Description BONE Special Requests RIGHT 4 METATARSAL SAMPLE B Gram Stain NO WBC SEEN NO ORGANISMS SEEN Culture RARE METHICILLIN RESISTANT STAPHYLOCOCCUS AUREUS NO ANAEROBES ISOLATED Performed at [facility], [place] [address], [place], [place] [address] Report Status 10/08/2019 FINAL Organism ID, Bacteria METHICILLIN RESISTANT STAPHYLOCOCCUS AUREUS Resulting Agency [facility] Susceptibility Methicillin resistant staphylococcus aureus MIC CIPROFLOXACIN >=8 RESISTANT Resistant CLINDAMYCIN <=0.25 SENS... Sensitive ERYTHROMYCIN >=8 RESISTANT Resistant GENTAMICIN <=0.5 SENSI... Sensitive Inducible Clindamycin NEGATIVE Sensitive OXACILLIN >=4 RESISTANT Resistant RIFAMPIN <=0.5 SENSI... Sensitive TETRACYCLINE <=1 SENSITIVE Sensitive TRIMETH/SULFA <=10 SENSIT Sensitive ... VANCOMYCIN 1 SENSITIVE Sensitive This is a patient we had in clinic earlier this year with a wound over his left fifth metatarsal head. 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 Doran Durand on 05/16/2019. He seems to have developed problems with wounds on his bilateral feet in June 2021 although he may have had problems earlier than this. He was in an urgent care with a right foot ulcer on 09/26/2019 and given a course of doxycycline. This was apparently after having trouble getting into see orthopedics. He was seen by podiatry on 09/28/2019 noted to have bilateral  lower extremity ulcers including the left lateral fifth metatarsal base and the right subfifth met head. It was noted that had purulent drainage at that time. He required hospitalization from 6/20 through 7/2. This was because of worsening right foot wounds. He underwent bilateral operative incision and drainage and bone biopsies bilaterally. Culture results are listed above. He has been referred back to clinic by Dr. Jacqualyn Posey of podiatry. He is also followed by Dr. Megan Salon who saw him yesterday. He was discharged from hospital on Zyvox Flagyl and Levaquin and yesterday changed to doxycycline Flagyl and Levaquin. His inflammatory markers on 6/26 showed a sedimentation rate of 129 and a C-reactive protein of 5. This is improved to 14 and 1.3 respectively. This would indicate improvement. ABIs in our clinic today were 1.23 on the right and 1.20 on the left 11/01/2019 on evaluation today patient appears to be doing fairly well in regard to the wounds on his feet at this point. Fortunately there is no signs of active infection at this time. No fevers, chills, nausea, vomiting, or diarrhea. He currently is seeing infectious disease and still under their care at this point. Subsequently he also has both wounds which she has not been using collagen on as he did not receive that in his packaging he did not call us and let us know that. Apparently that just was missed on the order. Nonetheless we will get that straightened out today. 8/9-Patient returns for bilateral foot wounds, using Prisma with hydrogel moistened dressings, and the wounds appear stable. Patient using surgical shoes, avoiding much pressure or weightbearing as much as possible 8/16; patient has bilateral foot wounds. 1 on the right lateral foot proximally the other is on the left mid lateral foot. Both required debridement of callus and thick skin around the wounds. We have been using silver collagen 8/27; patient has bilateral lateral foot  wounds. The area on the left substantially surrounded by callus and dry skin. This was removed from the wound edge. The underlying wound is small. The area on the right measured somewhat smaller today. 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 Unfortunately I did not update his antibiotics during today's visit. 9/10 I reviewed Dr. Hale Bogus last notes he felt he had completed antibiotics his inflammatory markers were reasonably well controlled. He has a small wound on the lateral left foot and a tiny area on the right which is just above closed. He is using Hydrofera Blue with border foam he has bilateral surgical shoes 9/24; 2 week f/u. doing well. right foot is closed. left foot still undermined. 10/14; right foot remains closed at the fifth met head. The area over the base of the left fifth metatarsal has a small open area but considerable undermining towards the plantar foot. Thick callus skin around this suggests an adequate pressure relief.  We have talked about this. He says he is going to go back into his cam boot. I suggested a total contact cast he did not seem enamored with this suggestion 10/26; left foot base of the fifth metatarsal. Same condition as last time. He has skin over the area with an open wound however the skin is not adherent. He went to see Dr. Earleen Newport who did an x-ray and culture of his foot I have not reviewed the x-ray but the patient was not told anything. He is on doxycycline 11/11; since the patient was last here he was in the emergency room on 10/30 he was concerned about swelling in the left foot. They did not do any cultures or x-rays. They changed his antibiotics to cephalexin. Previous culture showed group B strep. The cephalexin is appropriate as doxycycline has less than predictable coverage. Arrives in clinic today with swelling over this area under the wound. He also has a new wound on the right  fifth metatarsal head 11/18; the patient has a difficult wound on the lateral aspect of the left fifth metatarsal head. The wound was almost ballotable last week I opened it slightly expecting to see purulence however there was just bleeding. I cultured this this was negative. X-ray unchanged. We are trying to get an MRI but I am not sure were going to be able to get this through his insurance. He also has an area on the right lateral fifth metatarsal head this looks healthier 12/3; the patient finally got our MRI. Surprisingly this did not show osteomyelitis. I did show the soft tissue ulceration at the lateral plantar aspect of the fifth metatarsal base with a tiny residual 6 mm abscess overlying the superficial fascia I have tried to culture this area I have not been able to get this to grow anything. Nevertheless the protruding tissue looks aggravated. I suspect we should try to treat the underlying "abscess with broad-spectrum antibiotics. I am going to start him on Levaquin and Flagyl. He has much less edema in his legs and I am going to continue to wrap his legs and see him weekly 12/10. I started Levaquin and Flagyl on him last week. He just picked up the Flagyl apparently there was some delay. The worry is the wound on the left fifth metatarsal base which is substantial and worsening. His foot looks like he inverts at the ankle making this a weightbearing surface. Certainly no improvement in fact I think the measurements of this are somewhat worse. We have been using 12/17; he apparently just got the Levaquin yesterday this is 2 weeks after the fact. He has completed the Flagyl. The area over the left fifth metatarsal base still has protruding granulation tissue although it does not look quite as bad as it did some weeks ago. He has severe bilateral lymphedema although we have not been treating him for wounds on his legs this is definitely going to require compression. There was so much edema  in the left I did not wish to put him in a total contact cast today. I am going to increase his compression from 3-4 layer. The area on the right lateral fifth met head actually look quite good and superficial. 12/23; patient arrived with callus on the right fifth met head and the substantial hyper granulated callused wound on the base of his fifth metatarsal. He says he is completing his Levaquin in 2 days but I do not think that adds up with what I gave him  but I will have to double check this. We are using Hydrofera Blue on both areas. My plan is to put the left leg in a cast the week after New Year's 04/06/2020; patient's wounds about the same. Right lateral fifth metatarsal head and left lateral foot over the base of the fifth metatarsal. There is undermining on the left lateral foot which I removed before application of total contact cast continuing with Hydrofera Blue new. Patient tells me he was seen by endocrinology today lab work was done [Dr. Kerr]. Also wondering whether he was referred to cardiology. I went over some lab work from previously does not have chronic renal failure certainly not nephrotic range proteinuria he does have very poorly controlled diabetes but this is not his most updated lab work. Hemoglobin A1c has been over 11 1/10; the patient had a considerable amount of leakage towards mid part of his left foot with macerated skin however the wound surface looks better the area on the right lateral fifth met head is better as well. I am going to change the dressing on the left foot under the total contact cast to silver alginate, continue with Hydrofera Blue on the right. 1/20; patient was in the total contact cast for 10 days. Considerable amount of drainage although the skin around the wound does not look too bad on the left foot. The area on the right fifth metatarsal head is closed. Our nursing staff reports large amount of drainage out of the left lateral foot wound 1/25;  continues with copious amounts of drainage described by our intake staff. PCR culture I did last week showed E. coli and Enterococcus faecalis and low quantities. Multiple resistance genes documented including extended spectrum beta lactamase, MRSA, MRSE, quinolone, tetracycline. The wound is not quite as good this week as it was 5 days ago but about the same size 2/3; continues with copious amounts of malodorous drainage per our intake nurse. The PCR culture I did 2 weeks ago showed E. coli and low quantities of Enterococcus. There were multiple resistance genes detected. I put Neosporin on him last week although this does not seem to have helped. The wound is slightly deeper today. Offloading continues to be an issue here although with the amount of drainage she has a total contact cast is just not going to work 2/10; moderate amount of drainage. Patient reports he cannot get his stocking on over the dressing. I told him we have to do that the nurse gave him suggestions on how to make this work. The wound is on the bottom and lateral part of his left foot. Is cultured predominantly grew low amounts of Enterococcus, E. coli and anaerobes. There were multiple resistance genes detected including extended spectrum beta lactamase, quinolone, tetracycline. I could not think of an easy oral combination to address this so for now I am going to do topical antibiotics provided by [facility] I think the main agents here are vancomycin and an aminoglycoside. We have to be able to give him access to the wounds to get the topical antibiotic on 2/17; moderate amount of drainage this is unchanged. He has his Keystone topical antibiotic against the deep tissue culture organisms. He has been using this and changing the dressing daily. Silver alginate on the wound surface. 2/24; using Keystone antibiotic with silver alginate on the top. He had too much drainage for a total contact cast at one point although I think that  is improving and I think in the next week  or 2 it might be possible to replace a total contact cast I did not do this today. In general the wound surface looks healthy however he continues to have thick rims of skin and subcutaneous tissue around the wide area of the circumference which I debrided 06/04/2020 upon evaluation today patient appears to be doing well in regard to his wound. I do feel like he is showing signs of improvement. There is little bit of callus and dead tissue around the edges of the wound as well as what appears to be a little bit of a sinus tract that is off to the side laterally I would perform debridement to clear that away today. 3/17; left lateral foot. The wound looks about the same as I remember. Not much depth surface looks healthy. No evidence of infection 3/25; left lateral foot. Wound surface looks about the same. Separating epithelium from the circumference. There really is no evidence of infection here however not making progress by my view 3/29; left lateral foot. Surface of the wound again looks reasonably healthy still thick skin and subcutaneous tissue around the wound margins. There is no evidence of infection. One of the concerns being brought up by the nurses has again the amount of drainage vis--vis continued use of a total contact cast 4/5; left lateral foot at roughly the base of the fifth metatarsal. Nice healthy looking granulated tissue with rims of epithelialization. The overall wound measurements are not any better but the tissue looks healthy. The only concern is the amount of drainage although he has no surrounding maceration with what we have been doing recently to absorb fluid and protect his skin. He also has lymphedema. 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 He states that the kerlix/coban under the TCC rolled down and could not get it back up. He has been using an offloading scooter and has somehow hurt his right foot using this device. This happened last week. He states that the side of his right foot developed a blister and opened. The top of his foot also has a few small open wounds he thinks is due to his socks rubbing in his shoes. He has not been using any dressings to the wound. He denies purulent drainage, fever/chills or erythema to the wounds. 4/22; patient presents for 1 week follow-up. He developed new wounds to the right foot that were evaluated at last clinic visit. He continues to have a total contact cast to the left leg and he reports no issues. He has been using silver collagen to the right foot wounds with no issues. He denies purulent drainage, fever/chills or erythema to the right foot wounds. He has no complaints today 4/25; patient presents for 1 week follow-up. He has a total contact cast of the left leg and reports no issues. He has been using silver alginate to the right foot wound. He denies purulent drainage, fever/chills or erythema to the right foot wounds. 5/2 patient presents for 1 week follow-up. T contact cast on the left. The wound which is on the base of the plantar foot at the base of the fifth metatarsal otal actually looks quite good and dimensions continue to gradually contract. HOWEVER the area on the right lateral  fifth metatarsal head is much larger than what I remember from 2 weeks ago. Once more is he has significant levels of hypergranulation. Noteworthy that he had this same hyper granulated response on his wound on the left foot at one point in time. So much so that he I thought there was an underlying fluid collection. Based on this I think this just needs debridement. 5/9; the wound on the left actually continues to be gradually smaller  with a healthy surface. Slight amount of drainage and maceration of the skin around but not too bad. However he has a large wound over the right fifth metatarsal head very much in the same configuration as his left foot wound was initially. I used silver nitrate to address the hyper granulated tissue no mechanical debridement 5/16; area on the left foot did not look as healthy this week deeper thick surrounding macerated skin and subcutaneous tissue. The area on the right foot fifth met head was about the same The area on the right ankle that we identified last week is completely broken down into an open wound presumably a stocking rubbing issue Electronic Signature(s) Signed: 08/18/2020 4:16:43 PM By: [name] MD Entered By: [name] on 08/17/2020 14:02:41 -------------------------------------------------------------------------------- Physical Exam Details Patient Name: Date of Service: [name], [name] 08/17/2020 12:45 PM Medical Record Number: [id number] Patient Account Number: [id number] Date of Birth/Sex: Treating RN: [birth year] (35 y.o. [name] Primary Care Provider: [name] Other Clinician: Referring Provider: Treating Provider/Extender: [name] in Treatment: 17 Constitutional Patient is hypertensive.. Pulse regular and within target range for patient.Marland Kitchen Respirations regular, non-labored and within target range.. Temperature is normal and within the target range for the patient.Marland Kitchen Appears in no distress. Notes Wound exam; left plantar foot wound did not look as good this week. Thick raised edges of skin and subcutaneous tissue macerated skin on the bottom. I used a #5 curette to remove this hemostasis with silver nitrate and a pressure dressing. Trying to get a better edge on the wound surface Right fifth metatarsal head did not look too bad but still hyper granulated. I knocked this down with silver nitrate Electronic  Signature(s) Signed: 08/18/2020 4:16:43 PM By: [name] MD Entered By: [name] on 08/17/2020 14:06:24 -------------------------------------------------------------------------------- Physician Orders Details Patient Name: Date of Service: [name], [name] 08/17/2020 12:45 PM Medical Record Number: [id number] Patient Account Number: [id number] Date of Birth/Sex: Treating RN: 04-Jun-1985 (35 y.o. 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 - ***60 minutes for cast*** Return A Bathing/ Shower/ Hygiene May shower with protection but do not get wound dressing(s) wet. - use a cast protector to left leg. Edema Control - Lymphedema / SCD / Other Bilateral Lower Extremities Elevate legs to the level of the heart or above for 30 minutes daily and/or when sitting, a frequency of: - throughout the day Avoid standing for long periods of time. Exercise regularly Moisturize legs daily. - right leg every night before bed. Compression stocking or Garment 20-30 mm/Hg pressure to: -  Apply to right leg in the morning and remove at night. Off-Loading Total Contact Cast to Left Lower Extremity Open toe surgical shoe to: - right foot Wound Treatment Wound #3 - Foot Wound Laterality: Left, Lateral Cleanser: Soap and Water 1 x Per Week/30 Days Discharge Instructions: May shower and wash wound with dial antibacterial soap and water  prior to dressing change. Peri-Wound Care: Zinc Oxide Ointment 30g tube 1 x Per Week/30 Days Discharge Instructions: Apply Zinc Oxide to periwound with each dressing change as needed. Prim Dressing: KerraCel Ag Gelling Fiber Dressing, 2x2 in (silver alginate) 1 x Per Week/30 Days ary Discharge Instructions: Apply silver alginate to wound bed as instructed Secondary Dressing: Woven Gauze Sponge, Non-Sterile 4x4 in 1 x Per Week/30 Days Discharge Instructions: Apply over primary dressing as directed. Secondary Dressing: ABD Pad, 8x10 1 x Per Week/30 Days Discharge Instructions: T down before applying kerlix. Apply over primary dressing as directed. ape Secondary Dressing: Zetuvit Plus 4x8 in 1 x Per Week/30 Days Discharge Instructions: Apply over primary dressing as directed. Secured With: [employer], 4.5x3.1 (in/yd) 1 x Per Week/30 Days Discharge Instructions: Secure with Kerlix as directed. Wound #5 - Metatarsal head fifth Wound Laterality: Right, Lateral Cleanser: Normal Saline (DME) (Generic) Every Other Day/15 Days Discharge Instructions: Cleanse the wound with Normal Saline prior to applying a clean dressing using gauze sponges, not tissue or cotton balls. Cleanser: Soap and Water Every Other Day/15 Days Discharge Instructions: May shower and wash wound with dial antibacterial soap and water prior to dressing change. Prim Dressing: KerraCel Ag Gelling Fiber Dressing, 2x2 in (silver alginate) (DME) (Generic) Every Other Day/15 Days ary Discharge Instructions: Apply silver alginate to wound bed as instructed Secondary Dressing: ABD Pad, 8x10 (DME) (Generic) Every Other Day/15 Days Discharge Instructions: Apply over primary dressing as directed. Secondary Dressing: Optifoam Non-Adhesive Dressing, 4x4 in (DME) (Generic) Every Other Day/15 Days Discharge Instructions: Apply over primary dressing, cut to make foam donut to offload Secured With: Kerlix Roll Sterile, 4.5x3.1 (in/yd)  (DME) (Generic) Every Other Day/15 Days Discharge Instructions: Secure with Kerlix as directed. Secured With: 85M Medipore H Soft Cloth Surgical Tape, 2x2 (in/yd) (DME) (Generic) Every Other Day/15 Days Discharge Instructions: Secure dressing with tape as directed. Wound #7 - Ankle Wound Laterality: Right, Anterior Cleanser: Normal Saline Every Other Day/15 Days Discharge Instructions: Cleanse the wound with Normal Saline prior to applying a clean dressing using gauze sponges, not tissue or cotton balls. Cleanser: Soap and Water Every Other Day/15 Days Discharge Instructions: May shower and wash wound with dial antibacterial soap and water prior to dressing change. Prim Dressing: KerraCel Ag Gelling Fiber Dressing, 2x2 in (silver alginate) Every Other Day/15 Days ary Discharge Instructions: Apply silver alginate to wound bed as instructed Secondary Dressing: Woven Gauze Sponge, Non-Sterile 4x4 in Every Other Day/15 Days Discharge Instructions: Apply over primary dressing as directed. Secondary Dressing: ABD Pad, 8x10 Every Other Day/15 Days Discharge Instructions: Apply over primary dressing as directed. Secured With: [employer], 4.5x3.1 (in/yd) Every Other Day/15 Days Discharge Instructions: Secure with Kerlix as directed. Secured With: 85M Medipore H Soft Cloth Surgical Tape, 2x2 (in/yd) Every Other Day/15 Days Discharge Instructions: Secure dressing with tape as directed. Electronic Signature(s) Signed: 08/18/2020 4:16:43 PM By: [name] MD Signed: 08/19/2020 6:30:22 PM By: [name] RN, BSN Entered By: [name] on 08/17/2020 17:06:51 -------------------------------------------------------------------------------- Problem List Details Patient Name: Date of Service: [name], [name] 08/17/2020 12:45 PM Medical Record Number: [id number] Patient Account Number:  [id number] Date of Birth/Sex: Treating RN: 09-26-1985 (35 y.o. 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 08/10/2020 No Yes Inactive Problems ICD-10 Code Description Active Date Inactive Date L97.518 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ity 10/24/2019 10/24/2019 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, right ankle and foot 10/24/2019 10/24/2019 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, left ankle and foot 10/24/2019 10/24/2019 B95.62 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 10/24/2019 10/24/2019 classified elsewhere Resolved Problems Electronic Signature(s) Signed: 08/18/2020 4:16:43 PM By: [name] MD Entered By: [name] on 08/17/2020 14:00:52 -------------------------------------------------------------------------------- Progress Note Details Patient Name: Date of Service: [name], [name] 08/17/2020 12:45 PM Medical Record Number: [id number] Patient Account Number: [id number] Date of Birth/Sex: Treating RN: 26-Apr-1985 (35 y.o. 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 He comes in for review of a wound over the plantar fifth metatarsal  head extending into the lateral part of the foot. He was followed for this previously by his podiatrist Dr. Cornelius Moras. As the patient tells his story he went to see podiatry first for a swelling he developed on the lateral part of his fifth metatarsal head in May. He states this was "open" by podiatry and the area closed. He was followed up in June and it was again opened callus removed and it closed promptly. There were plans being made for surgery on the fifth metatarsal head in June however his blood sugar was apparently too high for anesthesia. Apparently the area was debrided and opened again in June and it is never closed since. Looking over the records from podiatry I am really not able to follow this. It was clear when he was first seen it was before 5/14 at that point he already had a wound. By 5/17 the ulcer was resolved. I do not see anything about a procedure. On 5/28 noted to have pre-ulcerative moderate keratosis. X-ray noted 1/5 contracted toe and tailor's bunion and metatarsal deformity. On a visit date on 09/28/2018 the dorsal part of the left foot it healed and resolved. There was concern about swelling in his lower extremity he was sent to the ER.. As far as I can tell he was seen in the ER on 7/12 with an ulcer on his left foot. A DVT rule out of the left leg was negative. I do not think I have complete records from podiatry but I am not able to verify the procedures this patient states he had. He states after the last procedure the wound has never closed although I am not able to follow this in the records I have from podiatry. He has not had a recent x-ray The patient has been using Neosporin on the wound. He is wearing a Darco shoe. He is still very active up on his foot  working and exercising. Past medical history; type 2 diabetes ketosis-prone, leg swelling with a negative DVT study in July. Non-smoker ABI in our clinic was 0.85 on the left 10/16; substantial wound on the plantar  left fifth met head extending laterally almost to the dorsal fifth MTP. We have been using silver alginate we gave him a Darco forefoot off loader. An x-ray did not show evidence of osteomyelitis did note soft tissue emphysema which I think was due to gas tracking through an open wound. There is no doubt in my mind he requires an MRI 10/23; MRI not booked until 3 November at the earliest this is largely due to his glucose sensor in the right arm. We have been using silver alginate. There has been an improvement 10/29; I am still not exactly sure when his MRI is booked for. He says it is the third but it is the 10th in epic. This definitely needs to be done. He is running a low-grade fever today but no other symptoms. No real improvement in the 1 02/26/2019 patient presents today for a follow-up visit here in our clinic he is last been seen in the clinic on October 29. Subsequently we were working on getting MRI to evaluate and see what exactly was going on and where we would need to go from the standpoint of whether or not he had osteomyelitis and again what treatments were going be required. Subsequently the patient ended up being admitted to the hospital on 02/07/2019 and was discharged on 02/14/2019. This is a somewhat interesting admission with a discharge diagnosis of pneumonia due to COVID-19 although he was positive for COVID-19 when tested at the urgent care but negative x2 when he was actually in the hospital. With that being said he did have acute respiratory failure with hypoxia and it was noted he also have a left foot ulceration with osteomyelitis. With that being said he did require oxygen for his pneumonia and I level 4 L. He was placed on antivirals and steroids for the COVID-19. He was also transferred to the [facility] at one point. Nonetheless he did subsequently discharged home and since being home has done much better in that regard. The CT angiogram did not show any  pulmonary embolism. With regard to the osteomyelitis the patient was placed on vancomycin and Zosyn while in the hospital but has been changed to Augmentin at discharge. It was also recommended that he follow- up with wound care and podiatry. Podiatry however wanted him to see [name] according to the patient prior to them doing anything further. His hemoglobin A1c was 9.9 as noted in the hospital. Have an MRI of the left foot performed while in the hospital on 02/04/2019. 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 There is an overlying plantar open wound noted an abscess tracking back along the lateral aspect of the fifth metatarsal shaft. There is otherwise diffuse cellulitis and mild fasciitis without findings of polymyositis. The patient did have recently pneumonia secondary to COVID-19 I looked in the chart through epic and it does appear that the patient may need to have an additional x-ray just to ensure everything is cleared and that he has no airspace disease prior to putting him into the [name]. 03/05/2019; patient was readmitted to the clinic last week. He was hospitalized twice for a viral upper respiratory tract infection from 11/1 through 11/4 and then 11/5 through 11/12  ultimately this turned out to be Covid pneumonitis. Although he was discharged on oxygen he is not using it. He says he feels fine. He has no exercise limitation no cough no sputum. His O2 sat in our clinic today was 100% on room air. 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 He received Vanco and Zosyn in the hospital and then was discharged on 2 weeks of Augmentin. I do not see any relevant cultures. He was supposed to follow-up with infectious disease but I do not see that he has an appointment. 12/8; patient saw  Dr. Novella Olive of infectious disease last week. He felt that he had had adequate antibiotic therapy. He did not go to follow-up with Dr. Amalia Hailey of podiatry and I have again talked to him about the pros and cons of this. He does not want to consider a ray amputation of this time. He is aware of the risks of recurrence, migration etc. He started HBO today and tolerated this well. He can complete the Augmentin that I gave him last week. I have looked over the lab work that Dr. Chana Bode ordered his C-reactive protein was 3.3 and his sedimentation rate was 17. The C-reactive protein is never really been measurably that high in this patient 12/15; not much change in the wound today however he has undermining along the lateral part of the foot again more extensively than last week. He has some rims of epithelialization. We have been using silver alginate. He is undergoing hyperbarics but did not dive today 12/18; in for his obligatory first total contact cast change. Unfortunately there was pus coming from the undermining area around his fifth metatarsal head. This was cultured but will preclude reapplication of a cast. He is seen in conjunction with HBO 12/24; patient had staph lugdunensis in the wound in the undermining area laterally last time. We put him on doxycycline which should have covered this. The wound looks better today. I am going to give him another week of doxycycline before reattempting the total contact cast 12/31; the patient is completing antibiotics. Hemorrhagic debris in the distal part of the wound with some undermining distally. He also had hyper granulation. Extensive debridement with a #5 curette. The infected area that was on the lateral part of the fifth met head is closed over. I do not think he needs any more antibiotics. Patient was seen prior to HBO. Preparations for a total contact cast were made in the cast will be placed post hyperbarics 04/11/19; once again the patient arrives  today without complaint. He had been in a cast all week noted that he had heavy drainage this week. This resulted in large raised areas of macerated tissue around the wound 1/14; wound bed looks better slightly smaller. Hydrofera Blue has been changing himself. He had a heavy drainage last week which caused a lot of maceration around the wound so I took him out of a total contact cast he says the drainage is actually better this week He is seen today in conjunction with HBO 1/21; returns to clinic. He was up in [place] for a day or 2 attending a funeral. He comes back in with the wound larger and with a large area of exposed bone. He had osteomyelitis and septic arthritis of the fifth left metatarsal head while he was in hospital. He received IV antibiotics in the hospital for a prolonged period  of time then 3 weeks of Augmentin. Subsequently I gave him 2 weeks of doxycycline for more superficial wound infection. When I saw this last week the wound was smaller the surface of the wound looks satisfactory. 1/28; patient missed hyperbarics today. Bone biopsy I did last time showed Enterococcus faecalis and Staphylococcus lugdunensis . He has a wide area of exposed bone. We are going to use silver alginate as of today. I had another ethical discussion with the patient. 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 In this situation I think the likelihood of healing this is low. Therefore I have recommended a ray amputation and with the patient's agreement I have referred him to Dr. Doran Durand. The other issue is that his compliance with hyperbarics has been minimal because of his work schedule and given his underlying decision I am going to stop this today READMISSION 10/24/2019 MRI 09/29/2019 left foot IMPRESSION: 1. Apparent skin ulceration inferior and lateral to the 5th metatarsal base with underlying heterogeneous T2 signal and enhancement in the subcutaneous fat.  Small peripherally enhancing fluid collections along the plantar and lateral aspects of the 5th metatarsal base suspicious for abscesses. 2. Interval amputation through the mid 5th metatarsal with nonspecific low-level marrow edema and enhancement. Given the proximity to the adjacent soft tissue inflammatory changes, osteomyelitis cannot be excluded. 3. The additional bones appear unremarkable. MRI 09/29/2019 right foot IMPRESSION: 1. Soft tissue ulceration lateral to the 5th MTP joint. There is low-level T2 hyperintensity within the 4th and 5th metatarsal heads and adjacent proximal phalanges without abnormal T1 signal or cortical destruction. These findings are nonspecific and could be seen with early marrow edema, hyperemia or early osteomyelitis. No evidence of septic joint. 2. Mild tenosynovitis and synovial enhancement associated with the extensor digitorum tendons at the level of the midfoot. 3. Diffuse low-level muscular T2 hyperintensity and enhancement, most consistent with diabetic myopathy. LEFT FOOT BONE Methicillin resistant staphylococcus aureus Staphylococcus lugdunensis MIC MIC CIPROFLOXACIN >=8 RESISTANT Resistant <=0.5 SENSI... Sensitive CLINDAMYCIN <=0.25 SENS... Sensitive >=8 RESISTANT Resistant ERYTHROMYCIN >=8 RESISTANT Resistant >=8 RESISTANT Resistant GENTAMICIN <=0.5 SENSI... Sensitive <=0.5 SENSI... Sensitive Inducible Clindamycin NEGATIVE Sensitive NEGATIVE Sensitive OXACILLIN >=4 RESISTANT Resistant 2 SENSITIVE Sensitive RIFAMPIN <=0.5 SENSI... Sensitive <=0.5 SENSI... Sensitive TETRACYCLINE <=1 SENSITIVE Sensitive <=1 SENSITIVE Sensitive TRIMETH/SULFA <=10 SENSIT Sensitive <=10 SENSIT Sensitive ... Marland Kitchen.. VANCOMYCIN 1 SENSITIVE Sensitive <=0.5 SENSI... Sensitive Right foot bone . Component 3 wk ago Specimen Description BONE Special Requests RIGHT 4 METATARSAL SAMPLE B Gram Stain NO WBC SEEN NO ORGANISMS SEEN Culture RARE METHICILLIN RESISTANT  STAPHYLOCOCCUS AUREUS NO ANAEROBES ISOLATED Performed at [facility], [place] [address] [place], [place] [address] Report Status 10/08/2019 FINAL Organism ID, Bacteria METHICILLIN RESISTANT STAPHYLOCOCCUS AUREUS Resulting Agency [facility] Susceptibility Methicillin resistant staphylococcus aureus MIC CIPROFLOXACIN >=8 RESISTANT Resistant CLINDAMYCIN <=0.25 SENS... Sensitive ERYTHROMYCIN >=8 RESISTANT Resistant GENTAMICIN <=0.5 SENSI... Sensitive Inducible Clindamycin NEGATIVE Sensitive OXACILLIN >=4 RESISTANT Resistant RIFAMPIN <=0.5 SENSI... Sensitive TETRACYCLINE <=1 SENSITIVE Sensitive TRIMETH/SULFA <=10 SENSIT Sensitive ... VANCOMYCIN 1 SENSITIVE Sensitive This is a patient we had in clinic earlier this year with a wound over his left fifth metatarsal head. 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 Doran Durand on 05/16/2019. He seems to have developed  problems with wounds on his bilateral feet in June 2021 although he may have had problems earlier than this. He was in an urgent care with a right foot ulcer on 09/26/2019 and given a course of doxycycline. This was apparently after having trouble getting into see orthopedics. He was seen by podiatry on 09/28/2019 noted to have bilateral lower extremity ulcers including the left lateral fifth metatarsal base and the right subfifth met head. It was noted that had purulent drainage at that time. He required hospitalization from 6/20 through 7/2. This was because of worsening right foot wounds. He underwent bilateral operative incision and drainage and bone biopsies bilaterally. Culture results are listed above. He has been referred back to clinic by Dr. Jacqualyn Posey  of podiatry. He is also followed by Dr. Megan Salon who saw him yesterday. He was discharged from hospital on Zyvox Flagyl and Levaquin and yesterday changed to doxycycline Flagyl and Levaquin. His inflammatory markers on 6/26 showed a sedimentation rate of 129 and a C-reactive protein of 5. This is improved to 14 and 1.3 respectively. This would indicate improvement. ABIs in our clinic today were 1.23 on the right and 1.20 on the left 11/01/2019 on evaluation today patient appears to be doing fairly well in regard to the wounds on his feet at this point. Fortunately there is no signs of active infection at this time. No fevers, chills, nausea, vomiting, or diarrhea. He currently is seeing infectious disease and still under their care at this point. Subsequently he also has both wounds which she has not been using collagen on as he did not receive that in his packaging he did not call us and let us know that. Apparently that just was missed on the order. Nonetheless we will get that straightened out today. 8/9-Patient returns for bilateral foot wounds, using Prisma with hydrogel moistened dressings, and the wounds appear stable. Patient using surgical shoes, avoiding much pressure or weightbearing as much as possible 8/16; patient has bilateral foot wounds. 1 on the right lateral foot proximally the other is on the left mid lateral foot. Both required debridement of callus and thick skin around the wounds. We have been using silver collagen 8/27; patient has bilateral lateral foot wounds. The area on the left substantially surrounded by callus and dry skin. This was removed from the wound edge. The underlying wound is small. The area on the right measured somewhat smaller today. 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 Unfortunately I did not update his antibiotics during today's visit. 9/10 I reviewed Dr. Hale Bogus last notes he felt  he had completed antibiotics his inflammatory markers were reasonably well controlled. He has a small wound on the lateral left foot and a tiny area on the right which is just above closed. He is using Hydrofera Blue with border foam he has bilateral surgical shoes 9/24; 2 week f/u. doing well. right foot is closed. left foot still undermined. 10/14; right foot remains closed at the fifth met head. The area over the base of the left fifth metatarsal has a small open area but considerable undermining towards the plantar foot. Thick callus skin around this suggests an adequate pressure relief. We have talked about this. He says he is going to go back into his cam boot. I suggested a total contact cast he did not seem enamored with this suggestion 10/26; left foot base of the fifth metatarsal. Same condition as last time. He has skin  over the area with an open wound however the skin is not adherent. He went to see Dr. Earleen Newport who did an x-ray and culture of his foot I have not reviewed the x-ray but the patient was not told anything. He is on doxycycline 11/11; since the patient was last here he was in the emergency room on 10/30 he was concerned about swelling in the left foot. They did not do any cultures or x-rays. They changed his antibiotics to cephalexin. Previous culture showed group B strep. The cephalexin is appropriate as doxycycline has less than predictable coverage. Arrives in clinic today with swelling over this area under the wound. He also has a new wound on the right fifth metatarsal head 11/18; the patient has a difficult wound on the lateral aspect of the left fifth metatarsal head. The wound was almost ballotable last week I opened it slightly expecting to see purulence however there was just bleeding. I cultured this this was negative. X-ray unchanged. We are trying to get an MRI but I am not sure were going to be able to get this through his insurance. He also has an area on the right  lateral fifth metatarsal head this looks healthier 12/3; the patient finally got our MRI. Surprisingly this did not show osteomyelitis. I did show the soft tissue ulceration at the lateral plantar aspect of the fifth metatarsal base with a tiny residual 6 mm abscess overlying the superficial fascia I have tried to culture this area I have not been able to get this to grow anything. Nevertheless the protruding tissue looks aggravated. I suspect we should try to treat the underlying "abscess with broad-spectrum antibiotics. I am going to start him on Levaquin and Flagyl. He has much less edema in his legs and I am going to continue to wrap his legs and see him weekly 12/10. I started Levaquin and Flagyl on him last week. He just picked up the Flagyl apparently there was some delay. The worry is the wound on the left fifth metatarsal base which is substantial and worsening. His foot looks like he inverts at the ankle making this a weightbearing surface. Certainly no improvement in fact I think the measurements of this are somewhat worse. We have been using 12/17; he apparently just got the Levaquin yesterday this is 2 weeks after the fact. He has completed the Flagyl. The area over the left fifth metatarsal base still has protruding granulation tissue although it does not look quite as bad as it did some weeks ago. He has severe bilateral lymphedema although we have not been treating him for wounds on his legs this is definitely going to require compression. There was so much edema in the left I did not wish to put him in a total contact cast today. I am going to increase his compression from 3-4 layer. The area on the right lateral fifth met head actually look quite good and superficial. 12/23; patient arrived with callus on the right fifth met head and the substantial hyper granulated callused wound on the base of his fifth metatarsal. He says he is completing his Levaquin in 2 days but I do not think  that adds up with what I gave him but I will have to double check this. We are using Hydrofera Blue on both areas. My plan is to put the left leg in a cast the week after New Year's 04/06/2020; patient's wounds about the same. Right lateral fifth metatarsal head and left lateral foot  over the base of the fifth metatarsal. There is undermining on the left lateral foot which I removed before application of total contact cast continuing with Hydrofera Blue new. Patient tells me he was seen by endocrinology today lab work was done [Dr. Kerr]. Also wondering whether he was referred to cardiology. I went over some lab work from previously does not have chronic renal failure certainly not nephrotic range proteinuria he does have very poorly controlled diabetes but this is not his most updated lab work. Hemoglobin A1c has been over 11 1/10; the patient had a considerable amount of leakage towards mid part of his left foot with macerated skin however the wound surface looks better the area on the right lateral fifth met head is better as well. I am going to change the dressing on the left foot under the total contact cast to silver alginate, continue with Hydrofera Blue on the right. 1/20; patient was in the total contact cast for 10 days. Considerable amount of drainage although the skin around the wound does not look too bad on the left foot. The area on the right fifth metatarsal head is closed. Our nursing staff reports large amount of drainage out of the left lateral foot wound 1/25; continues with copious amounts of drainage described by our intake staff. PCR culture I did last week showed E. coli and Enterococcus faecalis and low quantities. Multiple resistance genes documented including extended spectrum beta lactamase, MRSA, MRSE, quinolone, tetracycline. The wound is not quite as good this week as it was 5 days ago but about the same size 2/3; continues with copious amounts of malodorous drainage per  our intake nurse. The PCR culture I did 2 weeks ago showed E. coli and low quantities of Enterococcus. There were multiple resistance genes detected. I put Neosporin on him last week although this does not seem to have helped. The wound is slightly deeper today. Offloading continues to be an issue here although with the amount of drainage she has a total contact cast is just not going to work 2/10; moderate amount of drainage. Patient reports he cannot get his stocking on over the dressing. I told him we have to do that the nurse gave him suggestions on how to make this work. The wound is on the bottom and lateral part of his left foot. Is cultured predominantly grew low amounts of Enterococcus, E. coli and anaerobes. There were multiple resistance genes detected including extended spectrum beta lactamase, quinolone, tetracycline. I could not think of an easy oral combination to address this so for now I am going to do topical antibiotics provided by [facility] I think the main agents here are vancomycin and an aminoglycoside. We have to be able to give him access to the wounds to get the topical antibiotic on 2/17; moderate amount of drainage this is unchanged. He has his Keystone topical antibiotic against the deep tissue culture organisms. He has been using this and changing the dressing daily. Silver alginate on the wound surface. 2/24; using Keystone antibiotic with silver alginate on the top. He had too much drainage for a total contact cast at one point although I think that is improving and I think in the next week or 2 it might be possible to replace a total contact cast I did not do this today. In general the wound surface looks healthy however he continues to have thick rims of skin and subcutaneous tissue around the wide area of the circumference which I debrided 06/04/2020  upon evaluation today patient appears to be doing well in regard to his wound. I do feel like he is showing signs of  improvement. There is little bit of callus and dead tissue around the edges of the wound as well as what appears to be a little bit of a sinus tract that is off to the side laterally I would perform debridement to clear that away today. 3/17; left lateral foot. The wound looks about the same as I remember. Not much depth surface looks healthy. No evidence of infection 3/25; left lateral foot. Wound surface looks about the same. Separating epithelium from the circumference. There really is no evidence of infection here however not making progress by my view 3/29; left lateral foot. Surface of the wound again looks reasonably healthy still thick skin and subcutaneous tissue around the wound margins. There is no evidence of infection. One of the concerns being brought up by the nurses has again the amount of drainage vis--vis continued use of a total contact cast 4/5; left lateral foot at roughly the base of the fifth metatarsal. Nice healthy looking granulated tissue with rims of epithelialization. The overall wound measurements are not any better but the tissue looks healthy. The only concern is the amount of drainage although he has no surrounding maceration with what we have been doing recently to absorb fluid and protect his skin. He also has lymphedema. 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 He states that the kerlix/coban under the TCC rolled down and could not get it back up. He has been using an offloading scooter and has somehow hurt his right foot using this device. This happened last week. He states that the side of his right foot developed a blister and opened. The top of his foot also has a few small open wounds he thinks is due to his  socks rubbing in his shoes. He has not been using any dressings to the wound. He denies purulent drainage, fever/chills or erythema to the wounds. 4/22; patient presents for 1 week follow-up. He developed new wounds to the right foot that were evaluated at last clinic visit. He continues to have a total contact cast to the left leg and he reports no issues. He has been using silver collagen to the right foot wounds with no issues. He denies purulent drainage, fever/chills or erythema to the right foot wounds. He has no complaints today 4/25; patient presents for 1 week follow-up. He has a total contact cast of the left leg and reports no issues. He has been using silver alginate to the right foot wound. He denies purulent drainage, fever/chills or erythema to the right foot wounds. 5/2 patient presents for 1 week follow-up. T contact cast on the left. The wound which is on the base of the plantar foot at the base of the fifth metatarsal otal actually looks quite good and dimensions continue to gradually contract. HOWEVER the area on the right lateral fifth metatarsal head is much larger than what I remember from 2 weeks ago. Once more is he has significant levels of hypergranulation. Noteworthy that he had this same hyper granulated response on his wound on the left foot at one point in time. So much so  that he I thought there was an underlying fluid collection. Based on this I think this just needs debridement. 5/9; the wound on the left actually continues to be gradually smaller with a healthy surface. Slight amount of drainage and maceration of the skin around but not too bad. However he has a large wound over the right fifth metatarsal head very much in the same configuration as his left foot wound was initially. I used silver nitrate to address the hyper granulated tissue no mechanical debridement 5/16; area on the left foot did not look as healthy this week deeper thick surrounding macerated  skin and subcutaneous tissue. oo The area on the right foot fifth met head was about the same oo The area on the right ankle that we identified last week is completely broken down into an open wound presumably a stocking rubbing issue Objective Constitutional Patient is hypertensive.. Pulse regular and within target range for patient.Marland Kitchen Respirations regular, non-labored and within target range.. Temperature is normal and within the target range for the patient.Marland Kitchen Appears in no distress. Vitals Time Taken: 1:14 PM, Height: 77 in, Weight: 280 lbs, BMI: 33.2, Temperature: 98.3 F, Pulse: 99 bpm, Respiratory Rate: 18 breaths/min, Blood Pressure: 147/82 mmHg, Capillary Blood Glucose: 137 mg/dl. General Notes: Wound exam; left plantar foot wound did not look as good this week. Thick raised edges of skin and subcutaneous tissue macerated skin on the bottom. I used a #5 curette to remove this hemostasis with silver nitrate and a pressure dressing. Trying to get a better edge on the wound surface oo Right fifth metatarsal head did not look too bad but still hyper granulated. I knocked this down with silver nitrate Integumentary (Hair, Skin) Wound #3 status is Open. Original cause of wound was Trauma. The date acquired was: 10/02/2019. The wound has been in treatment 42 weeks. The wound is located on the Left,Lateral Foot. The wound measures 2.3cm length x 2.5cm width x 0.6cm depth; 4.516cm^2 area and 2.71cm^3 volume. There is Fat Layer (Subcutaneous Tissue) exposed. There is no tunneling noted, however, there is undermining starting at 12:00 and ending at 12:00 with a maximum distance of 0.6cm. There is a medium amount of serosanguineous drainage noted. The wound margin is well defined and not attached to the wound base. There is large (67- 100%) red granulation within the wound bed. There is no necrotic tissue within the wound bed. Wound #5 status is Open. Original cause of wound was Gradually Appeared.  The date acquired was: 07/07/2020. The wound has been in treatment 4 weeks. The wound is located on the Right,Lateral Metatarsal head fifth. The wound measures 2.5cm length x 3cm width x 0.6cm depth; 5.89cm^2 area and 3.534cm^3 volume. There is Fat Layer (Subcutaneous Tissue) exposed. There is no tunneling or undermining noted. There is a medium amount of serosanguineous drainage noted. The wound margin is well defined and not attached to the wound base. There is large (67-100%) red, pink, hyper - granulation within the wound bed. There is a small (1-33%) amount of necrotic tissue within the wound bed including Adherent Slough. Wound #7 status is Open. Original cause of wound was Gradually Appeared. The date acquired was: 08/17/2020. The wound is located on the Right,Anterior Ankle. The wound measures 1.7cm length x 1.7cm width x 0.1cm depth; 2.27cm^2 area and 0.227cm^3 volume. There is Fat Layer (Subcutaneous Tissue) exposed. There is no tunneling or undermining noted. There is a medium amount of serosanguineous drainage noted. The wound margin is flat and intact.  There is large (67-100%) pink granulation within the wound bed. There is a small (1-33%) amount of necrotic tissue within the wound bed including Adherent Slough. 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 There was a Excisional Skin/Subcutaneous Tissue Debridement with a total area of 5.75 sq cm performed by [name], MD. With the following instrument(s): Curette to  remove Viable and Non-Viable tissue/material. Material removed includes Callus and Subcutaneous Tissue and. No specimens were taken. A time out was conducted at 13:53, prior to the start of the procedure. A Moderate amount of bleeding was controlled with Pressure. The procedure was tolerated well with a pain level of 0 throughout and a pain level of 0 following the procedure. Post Debridement Measurements: 2.3cm length x 2.5cm width x 0.6cm depth; 2.71cm^3 volume. Character of Wound/Ulcer Post Debridement is stable. Severity of Tissue Post Debridement is: Fat layer exposed. Post procedure Diagnosis Wound #3: Same as Pre-Procedure Pre-procedure diagnosis of Wound #3 is a Diabetic Wound/Ulcer of the Lower Extremity located on the Left,Lateral Foot . There was a T Contact Cast otal Procedure by [name], MD. Post procedure Diagnosis Wound #3: Same as Pre-Procedure Plan Follow-up Appointments: Return Appointment in 1 week. - ***60 minutes for cast*** Bathing/ Shower/ Hygiene: May shower with protection but do not get wound dressing(s) wet. - use a cast protector to left leg. Edema Control - Lymphedema / SCD / Other: Elevate legs to the level of the heart or above for 30 minutes daily and/or when sitting, a frequency of: - throughout the day Avoid standing for long periods of time. Exercise regularly Moisturize legs daily. - right leg every night before bed. Compression stocking or Garment 20-30 mm/Hg pressure to: - Apply to right leg in the morning and remove at night. Off-Loading: T Contact Cast to Left Lower Extremity otal Open toe surgical shoe to: - right foot WOUND #3: - Foot Wound Laterality: Left, Lateral Cleanser: Soap and Water 1 x Per Week/30 Days Discharge Instructions: May shower and wash wound with dial antibacterial soap and water prior to dressing change. Peri-Wound Care: Zinc Oxide Ointment 30g tube 1 x Per Week/30 Days Discharge Instructions: Apply Zinc Oxide to  periwound with each dressing change as needed. Prim Dressing: KerraCel Ag Gelling Fiber Dressing, 2x2 in (silver alginate) 1 x Per Week/30 Days ary Discharge Instructions: Apply silver alginate to wound bed as instructed Secondary Dressing: Woven Gauze Sponge, Non-Sterile 4x4 in 1 x Per Week/30 Days Discharge Instructions: Apply over primary dressing as directed. Secondary Dressing: ABD Pad, 8x10 1 x Per Week/30 Days Discharge Instructions: T down before applying kerlix. Apply over primary dressing as directed. ape Secondary Dressing: Zetuvit Plus 4x8 in 1 x Per Week/30 Days Discharge Instructions: Apply over primary dressing as directed. Secured With: [employer], 4.5x3.1 (in/yd) 1 x Per Week/30 Days Discharge Instructions: Secure with Kerlix as directed. WOUND #5: - Metatarsal head fifth Wound Laterality: Right, Lateral Cleanser: Normal Saline (DME) (Generic) Every Other Day/15 Days Discharge Instructions: Cleanse the wound with Normal Saline prior to applying a clean dressing using gauze sponges, not tissue  or cotton balls. Cleanser: Soap and Water Every Other Day/15 Days Discharge Instructions: May shower and wash wound with dial antibacterial soap and water prior to dressing change. Prim Dressing: KerraCel Ag Gelling Fiber Dressing, 2x2 in (silver alginate) (DME) (Generic) Every Other Day/15 Days ary Discharge Instructions: Apply silver alginate to wound bed as instructed Secondary Dressing: ABD Pad, 8x10 (DME) (Generic) Every Other Day/15 Days Discharge Instructions: Apply over primary dressing as directed. Secondary Dressing: Optifoam Non-Adhesive Dressing, 4x4 in (DME) (Generic) Every Other Day/15 Days Discharge Instructions: Apply over primary dressing, cut to make foam donut to offload Secured With: Kerlix Roll Sterile, 4.5x3.1 (in/yd) (DME) (Generic) Every Other Day/15 Days Discharge Instructions: Secure with Kerlix as directed. Secured With: 64M Medipore H Soft Cloth  Surgical T ape, 2x2 (in/yd) (DME) (Generic) Every Other Day/15 Days Discharge Instructions: Secure dressing with tape as directed. WOUND #7: - Ankle Wound Laterality: Right, Anterior Cleanser: Normal Saline Every Other Day/15 Days Discharge Instructions: Cleanse the wound with Normal Saline prior to applying a clean dressing using gauze sponges, not tissue or cotton balls. Cleanser: Soap and Water Every Other Day/15 Days Discharge Instructions: May shower and wash wound with dial antibacterial soap and water prior to dressing change. Prim Dressing: KerraCel Ag Gelling Fiber Dressing, 2x2 in (silver alginate) Every Other Day/15 Days ary Discharge Instructions: Apply silver alginate to wound bed as instructed Secondary Dressing: Woven Gauze Sponge, Non-Sterile 4x4 in Every Other Day/15 Days Discharge Instructions: Apply over primary dressing as directed. Secondary Dressing: ABD Pad, 8x10 Every Other Day/15 Days Discharge Instructions: Apply over primary dressing as directed. Secured With: [employer], 4.5x3.1 (in/yd) Every Other Day/15 Days Discharge Instructions: Secure with Kerlix as directed. Secured With: 64M Medipore H Soft Cloth Surgical T ape, 2x2 (in/yd) Every Other Day/15 Days Discharge Instructions: Secure dressing with tape as directed. 1. Because of the maceration on the left foot I change the primary dressing to Hydrofera Blue to silver alginate under the cast 2. Silver alginate to the area on the right foot as well as the right dorsal ankle as well. 3. There is undermining on the wound on the right foot this is going to need to be removed although I did not do this today 4. Fortunately no evidence of infection at this point Electronic Signature(s) Signed: 08/18/2020 4:16:43 PM By: [name] MD Entered By: [name] on 08/17/2020 14:07:17 -------------------------------------------------------------------------------- Total Contact Cast Details Patient  Name: Date of Service: [name] 08/17/2020 12:45 PM Medical Record Number: [id number] Patient Account Number: [id number] Date of Birth/Sex: Treating RN: 07-01-1985 (35 y.o. [name] Primary Care Provider: [name] Other Clinician: Referring Provider: Treating Provider/Extender: [name] in Treatment: 42 T Contact Cast Applied for Wound Assessment: otal Wound #3 Left,Lateral Foot Performed By: Physician [name], MD Post Procedure Diagnosis Same as Pre-procedure Electronic Signature(s) Signed: 08/18/2020 4:16:43 PM By: [name] MD Entered By: [name] on 08/17/2020 14:01:30 -------------------------------------------------------------------------------- SuperBill Details Patient Name: Date of Service: [name], [name] 08/17/2020 Medical Record Number: [id number] Patient Account Number: [id number] Date of Birth/Sex: Treating RN: Aug 06, 1985 (35 y.o. 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

## 2020-08-19 NOTE — Progress Notes (Signed)
[name] ([id number]) Visit Report for 08/17/2020 Arrival Information Details Patient Name: Date of Service: [name] 08/17/2020 12:45 PM Medical Record Number: [id number] Patient Account Number: [id number] Date of Birth/Sex: Treating RN: Oct 04, 1985 (35 y.o. [name] Primary Care Provider: [name] Other Clinician: Referring Provider: Treating Provider/Extender: [name] in Treatment: 42 Visit Information History Since Last Visit Added or deleted any medications: No Patient Arrived: Ambulatory Any new allergies or adverse reactions: No Arrival Time: 13:13 Had a fall or experienced change in No Accompanied By: self activities of daily living that may affect Transfer Assistance: None risk of falls: Patient Identification Verified: Yes Signs or symptoms of abuse/neglect since last visito No Secondary Verification Process Completed: Yes Hospitalized since last visit: No Patient Requires Transmission-Based Precautions: No Implantable device outside of the clinic excluding No Patient Has Alerts: No cellular tissue based products placed in the center since last visit: Has Dressing in Place as Prescribed: Yes Pain Present Now: No Electronic Signature(s) Signed: 08/17/2020 2:51:01 PM By: [name] Entered By: [name] on 08/17/2020 13:14:07 -------------------------------------------------------------------------------- Encounter Discharge Information Details Patient Name: Date of Service: [name], [name] 08/17/2020 12:45 PM Medical Record Number: [id number] Patient Account Number: [id number] Date of Birth/Sex: Treating RN: 07-Jan-1986 (34 y.o. [name] Primary Care Provider: [name] Other Clinician: Referring Provider: Treating Provider/Extender: [name] in Treatment: 42 Encounter Discharge Information Items Post Procedure Vitals Discharge Condition:  Stable Temperature (F): 98.3 Ambulatory Status: Ambulatory Pulse (bpm): 99 Discharge Destination: Home Respiratory Rate (breaths/min): 18 Transportation: Private Auto Blood Pressure (mmHg): 147/82 Schedule Follow-up Appointment: Yes Clinical Summary of Care: Provided on 08/17/2020 Form Type Recipient Paper Patient Patient Electronic Signature(s) Signed: 08/17/2020 6:02:50 PM By: [name] Previous Signature: 08/17/2020 6:02:20 PM Version By: [name] Entered By: [name] on 08/17/2020 18:02:50 -------------------------------------------------------------------------------- Lower Extremity Assessment Details Patient Name: Date of Service: [name] 08/17/2020 12:45 PM Medical Record Number: [id number] Patient Account Number: [id number] Date of Birth/Sex: Treating RN: [birth year] (36 y.o. [name] Primary Care Provider: [name] Other Clinician: Referring Provider: Treating Provider/Extender: [name] in Treatment: 42 Edema Assessment Assessed: [name]: No] [name]: No] Edema: [Left: Yes] [Right: Yes] Calf Left: Right: Point of Measurement: 48 cm From Medial Instep 40 cm 44 cm Ankle Left: Right: Point of Measurement: 10 cm From Medial Instep 33 cm 31 cm Vascular Assessment Pulses: Dorsalis Pedis Palpable: [Left:Yes] [Right:Yes] Electronic Signature(s) Signed: 08/17/2020 2:51:01 PM By: [name] Signed: 08/19/2020 6:30:22 PM By: [name] Entered By: [name] on 08/17/2020 13:45:51 -------------------------------------------------------------------------------- Multi Wound Chart Details Patient Name: Date of Service: [name], [name] 08/17/2020 12:45 PM Medical Record Number: [id number] Patient Account Number: [id number] Date of Birth/Sex: Treating RN: May 06, 1985 (35 y.o. [name] Primary Care Provider: [name] Other Clinician: Referring Provider: Treating  Provider/Extender: [name] in Treatment: 42 Vital Signs Height(in): 77 Capillary Blood Glucose(mg/dl): 137 Weight(lbs): 280 Pulse(bpm): 99 Body Mass Index(BMI): 33 Blood Pressure(mmHg): 147/82 Temperature(F): 98.3 Respiratory Rate(breaths/min): 18 Photos: [3:No Photos Left, Lateral Foot] [5:No Photos Right, Lateral Metatarsal head fifth] [7:No Photos Right, Anterior Ankle] Wound Location: [3:Trauma] [5:Gradually Appeared] [7:Gradually Appeared] Wounding Event: [3:Diabetic Wound/Ulcer of the Lower] [5:Diabetic Wound/Ulcer of the Lower] [7:Diabetic Wound/Ulcer of the Lower] Primary Etiology: [3:Extremity Type II Diabetes] [5:Extremity Type II Diabetes] [7:Extremity Type II Diabetes] Comorbid History: [3:10/02/2019] [5:07/07/2020] [7:08/17/2020] Date Acquired: [3:42] [5:4] [7:0] Weeks of  Treatment: [3:Open] [5:Open] [7:Open] Wound Status: [3:2.3x2.5x0.6] [5:2.5x3x0.6] [7:1.7x1.7x0.1] Measurements L x W x [name] [3:4.516] [5:5.89] [7:2.27] A (cm) : rea [3:2.71] [5:3.534] [7:0.227] Volume (cm) : [3:-173.90%] [5:33.00%] [7:0.00%] % Reduction in A rea: [3:-1542.40%] [5:19.60%] [7:0.00%] % Reduction in Volume: [3:12] Starting Position 1 (o'clock): [3:12] Ending Position 1 (o'clock): [3:0.6] Maximum Distance 1 (cm): [3:Yes] [5:No] [7:No] Undermining: [3:Grade 2] [5:Grade 2] [7:Grade 2] Classification: [3:Medium] [5:Medium] [7:Medium] Exudate A mount: [3:Serosanguineous] [5:Serosanguineous] [7:Serosanguineous] Exudate Type: [3:red, brown] [5:red, brown] [7:red, brown] Exudate Color: [3:Well defined, not attached] [5:Well defined, not attached] [7:Flat and Intact] Wound Margin: [3:Large (67-100%)] [5:Large (67-100%)] [7:Large (67-100%)] Granulation A mount: [3:Red] [5:Red, Pink, Hyper-granulation] [7:Pink] Granulation Quality: [3:None Present (0%)] [5:Small (1-33%)] [7:Small (1-33%)] Necrotic A mount: [3:Fat Layer (Subcutaneous Tissue): Yes Fat Layer  (Subcutaneous Tissue): Yes Fat Layer (Subcutaneous Tissue): Yes] Exposed Structures: [3:Fascia: No Tendon: No Muscle: No Joint: No Bone: No Small (1-33%)] [5:Fascia: No Tendon: No Muscle: No Joint: No Bone: No Small (1-33%)] [7:Fascia: No Tendon: No Muscle: No Joint: No Bone: No None] Epithelialization: [3:Debridement - Excisional] [5:N/A] [7:N/A] Debridement: Pre-procedure Verification/Time Out 13:53 [5:N/A] [7:N/A] Taken: [3:Callus, Subcutaneous] [5:N/A] [7:N/A] Tissue Debrided: [3:Skin/Subcutaneous Tissue] [5:N/A] [7:N/A] Level: [3:5.75] [5:N/A] [7:N/A] Debridement A (sq cm): [3:rea Curette] [5:N/A] [7:N/A] Instrument: [3:Moderate] [5:N/A] [7:N/A] Bleeding: [3:Pressure] [5:N/A] [7:N/A] Hemostasis A chieved: [3:0] [5:N/A] [7:N/A] Procedural Pain: [3:0] [5:N/A] [7:N/A] Post Procedural Pain: [3:Procedure was tolerated well] [5:N/A] [7:N/A] Debridement Treatment Response: [3:2.3x2.5x0.6] [5:N/A] [7:N/A] Post Debridement Measurements L x W x [name] [3:2.71] [5:N/A] [7:N/A] Post Debridement Volume: (cm) [3:Debridement] [5:N/A] [7:N/A] Treatment Notes Electronic Signature(s) Signed: 08/18/2020 4:16:43 PM By: [name] MD Signed: 08/19/2020 6:30:22 PM By: [name] Entered By: [name] on 08/17/2020 14:01:01 -------------------------------------------------------------------------------- Multi-Disciplinary Care Plan Details Patient Name: Date of Service: [name], [name] 08/17/2020 12:45 PM Medical Record Number: [id number] Patient Account Number: [id number] Date of Birth/Sex: Treating RN: 1985-07-18 (35 y.o. [name] Primary Care Provider: [name] Other Clinician: Referring Provider: Treating Provider/Extender: [name] in Treatment: 42 Multidisciplinary Care Plan reviewed with physician Active Inactive Nutrition Nursing Diagnoses: Imbalanced nutrition Potential for alteratiion in Nutrition/Potential for  imbalanced nutrition Goals: Patient/caregiver agrees to and verbalizes understanding of need to use nutritional supplements and/or vitamins as prescribed Date Initiated: 10/24/2019 Date Inactivated: 04/06/2020 Target Resolution Date: 04/03/2020 Goal Status: Met Patient/caregiver will maintain therapeutic glucose control Date Initiated: 10/24/2019 Target Resolution Date: 09/18/2020 Goal Status: Active Interventions: Assess HgA1c results as ordered upon admission and as needed Assess patient nutrition upon admission and as needed per policy Provide education on elevated blood sugars and impact on wound healing Provide education on nutrition Treatment Activities: Education provided on Nutrition : 07/07/2020 Notes: Wound/Skin Impairment Nursing Diagnoses: Impaired tissue integrity Knowledge deficit related to ulceration/compromised skin integrity Goals: Patient/caregiver will verbalize understanding of skin care regimen Date Initiated: 10/24/2019 Target Resolution Date: 09/18/2020 Goal Status: Active Ulcer/skin breakdown will have a volume reduction of 30% by week 4 Date Initiated: 10/24/2019 Date Inactivated: 01/16/2020 Target Resolution Date: 01/10/2020 Unmet Reason: no change in Goal Status: Unmet measurements. Interventions: Assess patient/caregiver ability to obtain necessary supplies Assess patient/caregiver ability to perform ulcer/skin care regimen upon admission and as needed Assess ulceration(s) every visit Provide education on ulcer and skin care Notes: Electronic Signature(s) Signed: 08/19/2020 6:30:22 PM By: [name] Entered By: Max Hurst on 08/17/2020 17:14:38 -------------------------------------------------------------------------------- Pain Assessment Details Patient Name: Date of Service: [name], [name] 08/17/2020 12:45 PM Medical  Record Number: [id number] Patient Account Number: [id number] Date of Birth/Sex: Treating RN: 1986/01/22 (35 y.o.  [name] Primary Care Provider: [name] Other Clinician: Referring Provider: Treating Provider/Extender: [name] in Treatment: 42 Active Problems Location of Pain Severity and Description of Pain Patient Has Paino No Site Locations Pain Management and Medication Current Pain Management: Electronic Signature(s) Signed: 08/17/2020 2:51:01 PM By: [name] Signed: 08/19/2020 6:30:22 PM By: [name] Entered By: [name] on 08/17/2020 13:19:31 -------------------------------------------------------------------------------- Patient/Caregiver Education Details Patient Name: Date of Service: [name] 5/16/2022andnbsp12:45 PM Medical Record Number: [id number] Patient Account Number: [id number] Date of Birth/Gender: Treating RN: [birth year] (35 y.o. [name] Primary Care Physician: [name] Other Clinician: Referring Physician: Treating Physician/Extender: [name] in Treatment: 64 Education Assessment Education Provided To: Patient Education Topics Provided Wound/Skin Impairment: Methods: Explain/Verbal Responses: State content correctly [employer]) Signed: 08/19/2020 6:30:22 PM By: [name] Entered By: Max Hurst on 08/17/2020 17:14:48 -------------------------------------------------------------------------------- Wound Assessment Details Patient Name: Date of Service: [name], [name] 08/17/2020 12:45 PM Medical Record Number: [id number] Patient Account Number: [id number] Date of Birth/Sex: Treating RN: [birth year] (35 y.o. [name] Primary Care Provider: [name] Other Clinician: Referring Provider: Treating Provider/Extender: [name] in Treatment: 42 Wound Status Wound Number: 3 Primary Etiology: Diabetic Wound/Ulcer of the Lower Extremity Wound Location: Left, Lateral  Foot Wound Status: Open Wounding Event: Trauma Comorbid History: Type II Diabetes Date Acquired: 10/02/2019 Weeks Of Treatment: 42 Clustered Wound: No Photos Wound Measurements Length: (cm) 2.3 Width: (cm) 2.5 Depth: (cm) 0.6 Area: (cm) 4.516 Volume: (cm) 2.71 % Reduction in Area: -173.9% % Reduction in Volume: -1542.4% Epithelialization: Small (1-33%) Tunneling: No Undermining: Yes Starting Position (o'clock): 12 Ending Position (o'clock): 12 Maximum Distance: (cm) 0.6 Wound Description Classification: Grade 2 Wound Margin: Well defined, not attached Exudate Amount: Medium Exudate Type: Serosanguineous Exudate Color: red, brown Foul Odor After Cleansing: No Slough/Fibrino No Wound Bed Granulation Amount: Large (67-100%) Exposed Structure Granulation Quality: Red Fascia Exposed: No Necrotic Amount: None Present (0%) Fat Layer (Subcutaneous Tissue) Exposed: Yes Tendon Exposed: No Muscle Exposed: No Joint Exposed: No Bone Exposed: No Treatment Notes Wound #3 (Foot) Wound Laterality: Left, Lateral Cleanser Soap and Water Discharge Instruction: May shower and wash wound with dial antibacterial soap and water prior to dressing change. Peri-Wound Care Zinc Oxide Ointment 30g tube Discharge Instruction: Apply Zinc Oxide to periwound with each dressing change as needed. Topical Primary Dressing KerraCel Ag Gelling Fiber Dressing, 2x2 in (silver alginate) Discharge Instruction: Apply silver alginate to wound bed as instructed Secondary Dressing Woven Gauze Sponge, Non-Sterile 4x4 in Discharge Instruction: Apply over primary dressing as directed. Zetuvit Plus 4x8 in Discharge Instruction: Apply over primary dressing as directed. ABD Pad, 8x10 Discharge Instruction: T down before applying kerlix. Apply over primary dressing as directed. ape Secured With [employer], 4.5x3.1 (in/yd) Discharge Instruction: Secure with Kerlix as directed. Compression  Wrap Compression Stockings Add-Ons Electronic Signature(s) Signed: 08/17/2020 5:26:16 PM By: [name] Signed: 08/19/2020 6:30:22 PM By: [name] Entered By: [name] on 08/17/2020 17:22:36 -------------------------------------------------------------------------------- Wound Assessment Details Patient Name: Date of Service: [name], [name] 08/17/2020 12:45 PM Medical Record Number: [id number] Patient Account Number: [id number] Date of Birth/Sex: Treating RN: November 15, 1985 (35 y.o. [name] Primary Care Provider: [name] Other Clinician: Referring Provider: Treating Provider/Extender: [name] in Treatment: 42 Wound  Status Wound Number: 5 Primary Etiology: Diabetic Wound/Ulcer of the Lower Extremity Wound Location: Right, Lateral Metatarsal head fifth Wound Status: Open Wounding Event: Gradually Appeared Comorbid History: Type II Diabetes Date Acquired: 07/07/2020 Weeks Of Treatment: 4 Clustered Wound: No Photos Wound Measurements Length: (cm) 2.5 Width: (cm) 3 Depth: (cm) 0.6 Area: (cm) 5.89 Volume: (cm) 3.534 % Reduction in Area: 33% % Reduction in Volume: 19.6% Epithelialization: Small (1-33%) Tunneling: No Undermining: No Wound Description Classification: Grade 2 Wound Margin: Well defined, not attached Exudate Amount: Medium Exudate Type: Serosanguineous Exudate Color: red, brown Foul Odor After Cleansing: No Slough/Fibrino No Wound Bed Granulation Amount: Large (67-100%) Exposed Structure Granulation Quality: Red, Pink, Hyper-granulation Fascia Exposed: No Necrotic Amount: Small (1-33%) Fat Layer (Subcutaneous Tissue) Exposed: Yes Necrotic Quality: Adherent Slough Tendon Exposed: No Muscle Exposed: No Joint Exposed: No Bone Exposed: No Treatment Notes Wound #5 (Metatarsal head fifth) Wound Laterality: Right, Lateral Cleanser Normal Saline Discharge Instruction: Cleanse the  wound with Normal Saline prior to applying a clean dressing using gauze sponges, not tissue or cotton balls. Soap and Water Discharge Instruction: May shower and wash wound with dial antibacterial soap and water prior to dressing change. Peri-Wound Care Topical Primary Dressing KerraCel Ag Gelling Fiber Dressing, 2x2 in (silver alginate) Discharge Instruction: Apply silver alginate to wound bed as instructed Secondary Dressing ABD Pad, 8x10 Discharge Instruction: Apply over primary dressing as directed. Optifoam Non-Adhesive Dressing, 4x4 in Discharge Instruction: Apply over primary dressing, cut to make foam donut to offload Secured With [employer] Sterile, 4.5x3.1 (in/yd) Discharge Instruction: Secure with Kerlix as directed. 74M Medipore H Soft Cloth Surgical T ape, 2x2 (in/yd) Discharge Instruction: Secure dressing with tape as directed. Compression Wrap Compression Stockings Add-Ons Electronic Signature(s) Signed: 08/17/2020 5:26:16 PM By: [name] Signed: 08/19/2020 6:30:22 PM By: [name] Entered By: [name] on 08/17/2020 17:22:58 -------------------------------------------------------------------------------- Wound Assessment Details Patient Name: Date of Service: [name], [name] 08/17/2020 12:45 PM Medical Record Number: [id number] Patient Account Number: [id number] Date of Birth/Sex: Treating RN: 1985/04/30 (35 y.o. [name] Primary Care Provider: [name] Other Clinician: Referring Provider: Treating Provider/Extender: [name] in Treatment: 42 Wound Status Wound Number: 7 Primary Etiology: Diabetic Wound/Ulcer of the Lower Extremity Wound Location: Right, Anterior Ankle Wound Status: Open Wounding Event: Gradually Appeared Comorbid History: Type II Diabetes Date Acquired: 08/17/2020 Weeks Of Treatment: 0 Clustered Wound: No Wound Measurements Length: (cm) 1.7 Width: (cm) 1.7 Depth:  (cm) 0.1 Area: (cm) 2.27 Volume: (cm) 0.227 % Reduction in Area: 0% % Reduction in Volume: 0% Epithelialization: None Tunneling: No Undermining: No Wound Description Classification: Grade 2 Wound Margin: Flat and Intact Exudate Amount: Medium Exudate Type: Serosanguineous Exudate Color: red, brown Foul Odor After Cleansing: No Slough/Fibrino Yes Wound Bed Granulation Amount: Large (67-100%) Exposed Structure Granulation Quality: Pink Fascia Exposed: No Necrotic Amount: Small (1-33%) Fat Layer (Subcutaneous Tissue) Exposed: Yes Necrotic Quality: Adherent Slough Tendon Exposed: No Muscle Exposed: No Joint Exposed: No Bone Exposed: No Treatment Notes Wound #7 (Ankle) Wound Laterality: Right, Anterior Cleanser Normal Saline Discharge Instruction: Cleanse the wound with Normal Saline prior to applying a clean dressing using gauze sponges, not tissue or cotton balls. Soap and Water Discharge Instruction: May shower and wash wound with dial antibacterial soap and water prior to dressing change. Peri-Wound Care Topical Primary Dressing KerraCel Ag Gelling Fiber Dressing, 2x2 in (silver alginate) Discharge Instruction: Apply silver alginate to wound bed as instructed Secondary Dressing Woven Gauze Sponge, Non-Sterile  4x4 in Discharge Instruction: Apply over primary dressing as directed. ABD Pad, 8x10 Discharge Instruction: Apply over primary dressing as directed. Secured With [employer], 4.5x3.1 (in/yd) Discharge Instruction: Secure with Kerlix as directed. 47M Medipore H Soft Cloth Surgical T ape, 2x2 (in/yd) Discharge Instruction: Secure dressing with tape as directed. Compression Wrap Compression Stockings Add-Ons Electronic Signature(s) Signed: 08/19/2020 6:30:22 PM By: [name] Entered By: Max Hurst on 08/17/2020 13:50:06 -------------------------------------------------------------------------------- Vitals Details Patient Name: Date  of Service: [name], [name] 08/17/2020 12:45 PM Medical Record Number: [id number] Patient Account Number: [id number] Date of Birth/Sex: Treating RN: [birth year] (35 y.o. [name] Primary Care Sherrol Vicars: Other Clinician: [name] Referring Eldra Word: Treating Sharlie Shreffler/Extender: [name] in Treatment: 42 Vital Signs Time Taken: 13:14 Temperature (F): 98.3 Height (in): 77 Pulse (bpm): 99 Weight (lbs): 280 Respiratory Rate (breaths/min): 18 Body Mass Index (BMI): 33.2 Blood Pressure (mmHg): 147/82 Capillary Blood Glucose (mg/dl): 137 Reference Range: 80 - 120 mg / dl Electronic Signature(s) Signed: 08/17/2020 2:51:01 PM By: [name] Entered By: [name] on 08/17/2020 13:19:23

## 2020-08-24 ENCOUNTER — Encounter (HOSPITAL_BASED_OUTPATIENT_CLINIC_OR_DEPARTMENT_OTHER): Payer: BC Managed Care – PPO | Admitting: Internal Medicine

## 2020-08-24 ENCOUNTER — Other Ambulatory Visit: Payer: Self-pay

## 2020-08-24 DIAGNOSIS — L97518 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ity: Secondary | ICD-10-CM | POA: Diagnosis not present

## 2020-08-24 DIAGNOSIS — L97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: Secondary | ICD-10-CM

## 2020-08-24 DIAGNOSIS — E11621 Type 2 diabetes mellitus with foot ulcer: Secondary | ICD-10-CM

## 2020-08-24 DIAGNOSIS — L97528 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ity: Secondary | ICD-10-CM

## 2020-08-24 NOTE — Progress Notes (Signed)
[name] ([id number]) Visit Report for 08/24/2020 Chief Complaint Document Details Patient Name: Date of Service: [name] 08/24/2020 3:15 PM Medical Record Number: [id number] Patient Account Number: [id number] Date of Birth/Sex: Treating RN: 1985/04/05 (35 y.o. M) Primary Care Provider: [name] Other Clinician: Referring Provider: Treating Provider/Extender: [name] in Treatment: 87 Information Obtained from: Patient Chief Complaint 01/11/2019; patient is here for review of a rather substantial wound over the left fifth plantar metatarsal head extending into the lateral part of his foot 10/24/2019; patient returns to clinic with wounds on his bilateral feet with underlying osteomyelitis biopsy-proven Electronic Signature(s) Signed: 08/24/2020 4:59:14 PM By: [name] DO Entered By: [name] on 08/24/2020 16:53:05 -------------------------------------------------------------------------------- Debridement Details Patient Name: Date of Service: [name], [name] 08/24/2020 3:15 PM Medical Record Number: [id number] Patient Account Number: [id number] Date of Birth/Sex: Treating RN: Jan 14, 1986 (35 y.o. [name] Primary Care Provider: [name] Other Clinician: Referring Provider: Treating Provider/Extender: [name] in Treatment: 43 Debridement Performed for Assessment: Wound #3 Left,Lateral Foot Performed By: Physician [name], DO Debridement Type: Debridement Severity of Tissue Pre Debridement: Fat layer exposed Level of Consciousness (Pre-procedure): Awake and Alert Pre-procedure Verification/Time Out Yes - 16:43 Taken: Start Time: 16:43 T Area Debrided (L x W): otal 2.4 (cm) x 2.2 (cm) = 5.28 (cm) Tissue and other material debrided: Viable, Non-Viable, Callus, Slough, Subcutaneous, Slough Level: Skin/Subcutaneous Tissue Debridement Description:  Excisional Instrument: Curette Bleeding: Minimum Hemostasis Achieved: Pressure End Time: 16:45 Procedural Pain: 0 Post Procedural Pain: 0 Response to Treatment: Procedure was tolerated well Level of Consciousness (Post- Awake and Alert procedure): Post Debridement Measurements of Total Wound Length: (cm) 2.4 Width: (cm) 2.2 Depth: (cm) 0.5 Volume: (cm) 2.073 Character of Wound/Ulcer Post Debridement: Stable Severity of Tissue Post Debridement: Fat layer exposed Post Procedure Diagnosis Same as Pre-procedure Electronic Signature(s) Signed: 08/24/2020 4:59:14 PM By: [name] DO Signed: 08/24/2020 5:27:53 PM By: [name] RN, BSN Entered By: [name] on 08/24/2020 16:45:22 -------------------------------------------------------------------------------- Debridement Details Patient Name: Date of Service: [name], [name] 08/24/2020 3:15 PM Medical Record Number: [id number] Patient Account Number: [id number] Date of Birth/Sex: Treating RN: 03-Nov-1985 (35 y.o. [name] Primary Care Provider: [name] Other Clinician: Referring Provider: Treating Provider/Extender: [name] in Treatment: 43 Debridement Performed for Assessment: Wound #5 Right,Lateral Metatarsal head fifth Performed By: Physician [name], DO Debridement Type: Debridement Severity of Tissue Pre Debridement: Fat layer exposed Level of Consciousness (Pre-procedure): Awake and Alert Pre-procedure Verification/Time Out Yes - 16:43 Taken: Start Time: 16:43 T Area Debrided (L x W): otal 3 (cm) x 3 (cm) = 9 (cm) Tissue and other material debrided: Viable, Non-Viable, Callus, Slough, Subcutaneous, Slough Level: Skin/Subcutaneous Tissue Debridement Description: Excisional Instrument: Curette Bleeding: Moderate Hemostasis Achieved: Silver Nitrate End Time: 16:45 Procedural Pain: 0 Post Procedural Pain: 0 Response to Treatment: Procedure was  tolerated well Level of Consciousness (Post- Awake and Alert procedure): Post Debridement Measurements of Total Wound Length: (cm) 3 Width: (cm) 3 Depth: (cm) 0.2 Volume: (cm) 1.414 Character of Wound/Ulcer Post Debridement: Stable Severity of Tissue Post Debridement: Fat layer exposed Post Procedure Diagnosis Same as Pre-procedure Electronic Signature(s) Signed: 08/24/2020 4:59:14 PM By: [name] DO Signed: 08/24/2020 5:27:53 PM By: [name] RN, BSN Entered By: [name] on 08/24/2020 16:47:48 -------------------------------------------------------------------------------- Debridement Details Patient Name: Date of Service: [name], [name] 08/24/2020 3:15 PM Medical Record Number: [id number] Patient Account Number:  [id number] Date of Birth/Sex: Treating RN: 1985/12/29 (35 y.o. [name] Primary Care Provider: [name] Other Clinician: Referring Provider: Treating Provider/Extender: [name] in Treatment: 43 Debridement Performed for Assessment: Wound #7 Right,Anterior Ankle Performed By: Physician [name], DO Debridement Type: Debridement Severity of Tissue Pre Debridement: Fat layer exposed Level of Consciousness (Pre-procedure): Awake and Alert Pre-procedure Verification/Time Out Yes - 16:43 Taken: Start Time: 16:43 T Area Debrided (L x W): otal 2 (cm) x 2.4 (cm) = 4.8 (cm) Tissue and other material debrided: Viable, Non-Viable, Slough, Subcutaneous, Slough Level: Skin/Subcutaneous Tissue Debridement Description: Excisional Instrument: Curette Bleeding: Minimum Hemostasis Achieved: Pressure End Time: 16:45 Procedural Pain: 0 Post Procedural Pain: 0 Response to Treatment: Procedure was tolerated well Level of Consciousness (Post- Awake and Alert procedure): Post Debridement Measurements of Total Wound Length: (cm) 2 Width: (cm) 2.4 Depth: (cm) 0.1 Volume: (cm) 0.377 Character of Wound/Ulcer  Post Debridement: Stable Severity of Tissue Post Debridement: Fat layer exposed Post Procedure Diagnosis Same as Pre-procedure Electronic Signature(s) Signed: 08/24/2020 4:59:14 PM By: [name] DO Signed: 08/24/2020 5:27:53 PM By: [name] RN, BSN Entered By: [name] on 08/24/2020 16:48:45 -------------------------------------------------------------------------------- HPI Details Patient Name: Date of Service: [name], [name] 08/24/2020 3:15 PM Medical Record Number: [id number] Patient Account Number: [id number] Date of Birth/Sex: Treating RN: October 27, 1985 (35 y.o. 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 He comes in for review of a wound over the plantar fifth metatarsal head extending into the lateral part of the foot. He was followed for this previously by his podiatrist Dr. Cornelius Moras. As the patient tells his story he went to see podiatry first for a swelling he developed on the lateral part of his fifth metatarsal head in May. He states this was "open" by podiatry and the area closed. He was followed up in June and it was again opened callus removed and it closed promptly. There were plans being made for surgery on the fifth metatarsal head in June however his blood sugar was apparently too high for anesthesia. Apparently the area was debrided and opened again in June and it is never closed since. Looking over the records from podiatry I am really not able to follow this. It was clear when he was first seen it was before 5/14 at that point he already had a wound. By 5/17 the ulcer was resolved. I do not see anything about a procedure. On 5/28 noted to have pre-ulcerative moderate keratosis. X-ray noted  1/5 contracted toe and tailor's bunion and metatarsal deformity. On a visit date on 09/28/2018 the dorsal part of the left foot it healed and resolved. There was concern about swelling in his lower extremity he was sent to the ER.. As far as I can tell he was seen in the ER on 7/12 with an ulcer on his left foot. A DVT rule out of the left leg was negative. I do not think I have complete records from podiatry but I am not able to verify the procedures this patient states he had. He states after the last procedure the wound has never closed although I am not able to follow this in the records I have from podiatry. He has not had a recent x-ray The patient has been using Neosporin on  the wound. He is wearing a Darco shoe. He is still very active up on his foot working and exercising. Past medical history; type 2 diabetes ketosis-prone, leg swelling with a negative DVT study in July. Non-smoker ABI in our clinic was 0.85 on the left 10/16; substantial wound on the plantar left fifth met head extending laterally almost to the dorsal fifth MTP. We have been using silver alginate we gave him a Darco forefoot off loader. An x-ray did not show evidence of osteomyelitis did note soft tissue emphysema which I think was due to gas tracking through an open wound. There is no doubt in my mind he requires an MRI 10/23; MRI not booked until 3 November at the earliest this is largely due to his glucose sensor in the right arm. We have been using silver alginate. There has been an improvement 10/29; I am still not exactly sure when his MRI is booked for. He says it is the third but it is the 10th in epic. This definitely needs to be done. He is running a low-grade fever today but no other symptoms. No real improvement in the 1 02/26/2019 patient presents today for a follow-up visit here in our clinic he is last been seen in the clinic on October 29. Subsequently we were working on getting MRI to evaluate and see what  exactly was going on and where we would need to go from the standpoint of whether or not he had osteomyelitis and again what treatments were going be required. Subsequently the patient ended up being admitted to the hospital on 02/07/2019 and was discharged on 02/14/2019. This is a somewhat interesting admission with a discharge diagnosis of pneumonia due to COVID-19 although he was positive for COVID-19 when tested at the urgent care but negative x2 when he was actually in the hospital. With that being said he did have acute respiratory failure with hypoxia and it was noted he also have a left foot ulceration with osteomyelitis. With that being said he did require oxygen for his pneumonia and I level 4 L. He was placed on antivirals and steroids for the COVID-19. He was also transferred to the [facility] at one point. Nonetheless he did subsequently discharged home and since being home has done much better in that regard. The CT angiogram did not show any pulmonary embolism. With regard to the osteomyelitis the patient was placed on vancomycin and Zosyn while in the hospital but has been changed to Augmentin at discharge. It was also recommended that he follow- up with wound care and podiatry. Podiatry however wanted him to see [name] according to the patient prior to them doing anything further. His hemoglobin A1c was 9.9 as noted in the hospital. Have an MRI of the left foot performed while in the hospital on 02/04/2019. 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 There is an overlying plantar open wound noted an abscess tracking back along the lateral aspect of the fifth metatarsal shaft. There is otherwise diffuse cellulitis and mild fasciitis without findings of polymyositis. The patient did have recently pneumonia secondary to COVID-19 I looked in the chart through epic and it does appear that the  patient may need to have an additional x-ray just to ensure everything is cleared and that he has no airspace disease prior to putting him into the [name]. 03/05/2019; patient was readmitted to the clinic last week. He was hospitalized  twice for a viral upper respiratory tract infection from 11/1 through 11/4 and then 11/5 through 11/12 ultimately this turned out to be Covid pneumonitis. Although he was discharged on oxygen he is not using it. He says he feels fine. He has no exercise limitation no cough no sputum. His O2 sat in our clinic today was 100% on room air. 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 He received Vanco and Zosyn in the hospital and then was discharged on 2 weeks of Augmentin. I do not see any relevant cultures. He was supposed to follow-up with infectious disease but I do not see that he has an appointment. 12/8; patient saw Dr. Novella Olive of infectious disease last week. He felt that he had had adequate antibiotic therapy. He did not go to follow-up with Dr. Amalia Hailey of podiatry and I have again talked to him about the pros and cons of this. He does not want to consider a ray amputation of this time. He is aware of the risks of recurrence, migration etc. He started HBO today and tolerated this well. He can complete the Augmentin that I gave him last week. I have looked over the lab work that Dr. Chana Bode ordered his C-reactive protein was 3.3 and his sedimentation rate was 17. The C-reactive protein is never really been measurably that high in this patient 12/15; not much change in the wound today however he has undermining along the lateral part of the foot again more extensively than last week. He has some rims of epithelialization. We have been using silver alginate. He is undergoing hyperbarics but did not dive today 12/18; in for his obligatory first total contact  cast change. Unfortunately there was pus coming from the undermining area around his fifth metatarsal head. This was cultured but will preclude reapplication of a cast. He is seen in conjunction with HBO 12/24; patient had staph lugdunensis in the wound in the undermining area laterally last time. We put him on doxycycline which should have covered this. The wound looks better today. I am going to give him another week of doxycycline before reattempting the total contact cast 12/31; the patient is completing antibiotics. Hemorrhagic debris in the distal part of the wound with some undermining distally. He also had hyper granulation. Extensive debridement with a #5 curette. The infected area that was on the lateral part of the fifth met head is closed over. I do not think he needs any more antibiotics. Patient was seen prior to HBO. Preparations for a total contact cast were made in the cast will be placed post hyperbarics 04/11/19; once again the patient arrives today without complaint. He had been in a cast all week noted that he had heavy drainage this week. This resulted in large raised areas of macerated tissue around the wound 1/14; wound bed looks better slightly smaller. Hydrofera Blue has been changing himself. He had a heavy drainage last week which caused a lot of maceration around the wound so I took him out of a total contact cast he says the drainage is actually better this week He is seen today in conjunction with HBO 1/21; returns to clinic. He was up in [place] for a day or 2 attending a funeral. He comes back in with the wound larger and with a large area of exposed bone. He had osteomyelitis and septic arthritis of the fifth left metatarsal head  while he was in hospital. He received IV antibiotics in the hospital for a prolonged period of time then 3 weeks of Augmentin. Subsequently I gave him 2 weeks of doxycycline for more superficial wound infection. When I saw this last week the  wound was smaller the surface of the wound looks satisfactory. 1/28; patient missed hyperbarics today. Bone biopsy I did last time showed Enterococcus faecalis and Staphylococcus lugdunensis . He has a wide area of exposed bone. We are going to use silver alginate as of today. I had another ethical discussion with the patient. 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 In this situation I think the likelihood of healing this is low. Therefore I have recommended a ray amputation and with the patient's agreement I have referred him to Dr. Doran Durand. The other issue is that his compliance with hyperbarics has been minimal because of his work schedule and given his underlying decision I am going to stop this today READMISSION 10/24/2019 MRI 09/29/2019 left foot IMPRESSION: 1. Apparent skin ulceration inferior and lateral to the 5th metatarsal base with underlying heterogeneous T2 signal and enhancement in the subcutaneous fat. Small peripherally enhancing fluid collections along the plantar and lateral aspects of the 5th metatarsal base suspicious for abscesses. 2. Interval amputation through the mid 5th metatarsal with nonspecific low-level marrow edema and enhancement. Given the proximity to the adjacent soft tissue inflammatory changes, osteomyelitis cannot be excluded. 3. The additional bones appear unremarkable. MRI 09/29/2019 right foot IMPRESSION: 1. Soft tissue ulceration lateral to the 5th MTP joint. There is low-level T2 hyperintensity within the 4th and 5th metatarsal heads and adjacent proximal phalanges without abnormal T1 signal or cortical destruction. These findings are nonspecific and could be seen with early marrow edema, hyperemia or early osteomyelitis. No evidence of septic joint. 2. Mild tenosynovitis and synovial enhancement associated with the extensor digitorum tendons at the level of the midfoot. 3. Diffuse low-level muscular T2  hyperintensity and enhancement, most consistent with diabetic myopathy. LEFT FOOT BONE Methicillin resistant staphylococcus aureus Staphylococcus lugdunensis MIC MIC CIPROFLOXACIN >=8 RESISTANT Resistant <=0.5 SENSI... Sensitive CLINDAMYCIN <=0.25 SENS... Sensitive >=8 RESISTANT Resistant ERYTHROMYCIN >=8 RESISTANT Resistant >=8 RESISTANT Resistant GENTAMICIN <=0.5 SENSI... Sensitive <=0.5 SENSI... Sensitive Inducible Clindamycin NEGATIVE Sensitive NEGATIVE Sensitive OXACILLIN >=4 RESISTANT Resistant 2 SENSITIVE Sensitive RIFAMPIN <=0.5 SENSI... Sensitive <=0.5 SENSI... Sensitive TETRACYCLINE <=1 SENSITIVE Sensitive <=1 SENSITIVE Sensitive TRIMETH/SULFA <=10 SENSIT Sensitive <=10 SENSIT Sensitive ... Marland Kitchen.. VANCOMYCIN 1 SENSITIVE Sensitive <=0.5 SENSI... Sensitive Right foot bone . Component 3 wk ago Specimen Description BONE Special Requests RIGHT 4 METATARSAL SAMPLE B Gram Stain NO WBC SEEN NO ORGANISMS SEEN Culture RARE METHICILLIN RESISTANT STAPHYLOCOCCUS AUREUS NO ANAEROBES ISOLATED Performed at [facility], [place] [address], [place], [place] [address] Report Status 10/08/2019 FINAL Organism ID, Bacteria METHICILLIN RESISTANT STAPHYLOCOCCUS AUREUS Resulting Agency [facility] Susceptibility Methicillin resistant staphylococcus aureus MIC CIPROFLOXACIN >=8 RESISTANT Resistant CLINDAMYCIN <=0.25 SENS... Sensitive ERYTHROMYCIN >=8 RESISTANT Resistant GENTAMICIN <=0.5 SENSI... Sensitive Inducible Clindamycin NEGATIVE Sensitive OXACILLIN >=4 RESISTANT Resistant RIFAMPIN <=0.5 SENSI... Sensitive TETRACYCLINE <=1 SENSITIVE Sensitive TRIMETH/SULFA <=10 SENSIT Sensitive ... VANCOMYCIN 1 SENSITIVE Sensitive This is a patient we had in clinic earlier this year with a wound over his left fifth metatarsal head. 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 Doran Durand on 05/16/2019. He seems to have developed problems with wounds on his bilateral feet in June 2021 although he may have had problems earlier than this. He was in an urgent care with a right foot ulcer on 09/26/2019 and given a course of doxycycline. This was apparently after having trouble getting into see orthopedics. He was seen by podiatry on 09/28/2019 noted to have bilateral lower extremity ulcers including the left lateral fifth metatarsal base and the right subfifth met head. It was noted that had purulent drainage at that time. He required hospitalization from 6/20 through 7/2. This was because of worsening right foot wounds. He underwent bilateral operative incision and drainage and bone biopsies bilaterally. Culture results are listed above. He has been referred back to clinic by Dr. Jacqualyn Posey of podiatry. He is also followed by Dr. Megan Salon who saw him yesterday. He was discharged from hospital on Zyvox Flagyl and Levaquin and yesterday changed to doxycycline Flagyl and Levaquin. His inflammatory markers on 6/26 showed a sedimentation rate of 129 and a C-reactive protein of 5. This is improved to 14 and 1.3 respectively. This would indicate improvement. ABIs in our clinic today were 1.23 on the right and 1.20 on the left 11/01/2019 on evaluation today patient appears to be doing fairly well in regard to the wounds on his feet at this point. Fortunately there is no signs of active infection at this time. No fevers, chills, nausea, vomiting, or diarrhea. He currently is seeing infectious disease and still under their care at this point. Subsequently he also has both wounds which she has not been using collagen on as he did not receive that in his packaging he did not call us and let us know that. Apparently that just was missed on the  order. Nonetheless we will get that straightened out today. 8/9-Patient returns for bilateral foot wounds, using Prisma with hydrogel moistened dressings, and the wounds appear stable. Patient using surgical shoes, avoiding much pressure or weightbearing as much as possible 8/16; patient has bilateral foot wounds. 1 on the right lateral foot proximally the other is on the left mid lateral foot. Both required debridement of callus and thick skin around the wounds. We have been using silver collagen 8/27; patient has bilateral lateral foot wounds. The area on the left substantially surrounded by callus and dry skin. This was removed from the wound edge. The underlying wound is small. The area on the right measured somewhat smaller today. 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 Unfortunately I did not update his antibiotics during today's visit. 9/10 I reviewed Dr. Hale Bogus last notes he felt he had completed antibiotics his inflammatory markers were reasonably well controlled. He has a small wound on the lateral left foot and a tiny area on the right which is just above closed. He is using Hydrofera Blue with border foam he has bilateral surgical shoes 9/24; 2 week f/u. doing well. right foot is closed. left foot still undermined. 10/14; right foot remains closed at the fifth met head. The area over the base of the left fifth metatarsal has a small open area but considerable undermining towards the plantar foot. Thick callus skin around this suggests an adequate pressure relief. We have talked about this. He says he is going to go back into his cam boot. I suggested a total contact cast he did not seem enamored with this suggestion  10/26; left foot base of the fifth metatarsal. Same condition as last time. He has skin over the area with an open wound however the skin is not adherent. He went to see Dr. Earleen Newport who did an x-ray and  culture of his foot I have not reviewed the x-ray but the patient was not told anything. He is on doxycycline 11/11; since the patient was last here he was in the emergency room on 10/30 he was concerned about swelling in the left foot. They did not do any cultures or x-rays. They changed his antibiotics to cephalexin. Previous culture showed group B strep. The cephalexin is appropriate as doxycycline has less than predictable coverage. Arrives in clinic today with swelling over this area under the wound. He also has a new wound on the right fifth metatarsal head 11/18; the patient has a difficult wound on the lateral aspect of the left fifth metatarsal head. The wound was almost ballotable last week I opened it slightly expecting to see purulence however there was just bleeding. I cultured this this was negative. X-ray unchanged. We are trying to get an MRI but I am not sure were going to be able to get this through his insurance. He also has an area on the right lateral fifth metatarsal head this looks healthier 12/3; the patient finally got our MRI. Surprisingly this did not show osteomyelitis. I did show the soft tissue ulceration at the lateral plantar aspect of the fifth metatarsal base with a tiny residual 6 mm abscess overlying the superficial fascia I have tried to culture this area I have not been able to get this to grow anything. Nevertheless the protruding tissue looks aggravated. I suspect we should try to treat the underlying "abscess with broad-spectrum antibiotics. I am going to start him on Levaquin and Flagyl. He has much less edema in his legs and I am going to continue to wrap his legs and see him weekly 12/10. I started Levaquin and Flagyl on him last week. He just picked up the Flagyl apparently there was some delay. The worry is the wound on the left fifth metatarsal base which is substantial and worsening. His foot looks like he inverts at the ankle making this a weightbearing  surface. Certainly no improvement in fact I think the measurements of this are somewhat worse. We have been using 12/17; he apparently just got the Levaquin yesterday this is 2 weeks after the fact. He has completed the Flagyl. The area over the left fifth metatarsal base still has protruding granulation tissue although it does not look quite as bad as it did some weeks ago. He has severe bilateral lymphedema although we have not been treating him for wounds on his legs this is definitely going to require compression. There was so much edema in the left I did not wish to put him in a total contact cast today. I am going to increase his compression from 3-4 layer. The area on the right lateral fifth met head actually look quite good and superficial. 12/23; patient arrived with callus on the right fifth met head and the substantial hyper granulated callused wound on the base of his fifth metatarsal. He says he is completing his Levaquin in 2 days but I do not think that adds up with what I gave him but I will have to double check this. We are using Hydrofera Blue on both areas. My plan is to put the left leg in a cast the week after New  Year's 04/06/2020; patient's wounds about the same. Right lateral fifth metatarsal head and left lateral foot over the base of the fifth metatarsal. There is undermining on the left lateral foot which I removed before application of total contact cast continuing with Hydrofera Blue new. Patient tells me he was seen by endocrinology today lab work was done [Dr. Kerr]. Also wondering whether he was referred to cardiology. I went over some lab work from previously does not have chronic renal failure certainly not nephrotic range proteinuria he does have very poorly controlled diabetes but this is not his most updated lab work. Hemoglobin A1c has been over 11 1/10; the patient had a considerable amount of leakage towards mid part of his left foot with macerated skin however  the wound surface looks better the area on the right lateral fifth met head is better as well. I am going to change the dressing on the left foot under the total contact cast to silver alginate, continue with Hydrofera Blue on the right. 1/20; patient was in the total contact cast for 10 days. Considerable amount of drainage although the skin around the wound does not look too bad on the left foot. The area on the right fifth metatarsal head is closed. Our nursing staff reports large amount of drainage out of the left lateral foot wound 1/25; continues with copious amounts of drainage described by our intake staff. PCR culture I did last week showed E. coli and Enterococcus faecalis and low quantities. Multiple resistance genes documented including extended spectrum beta lactamase, MRSA, MRSE, quinolone, tetracycline. The wound is not quite as good this week as it was 5 days ago but about the same size 2/3; continues with copious amounts of malodorous drainage per our intake nurse. The PCR culture I did 2 weeks ago showed E. coli and low quantities of Enterococcus. There were multiple resistance genes detected. I put Neosporin on him last week although this does not seem to have helped. The wound is slightly deeper today. Offloading continues to be an issue here although with the amount of drainage she has a total contact cast is just not going to work 2/10; moderate amount of drainage. Patient reports he cannot get his stocking on over the dressing. I told him we have to do that the nurse gave him suggestions on how to make this work. The wound is on the bottom and lateral part of his left foot. Is cultured predominantly grew low amounts of Enterococcus, E. coli and anaerobes. There were multiple resistance genes detected including extended spectrum beta lactamase, quinolone, tetracycline. I could not think of an easy oral combination to address this so for now I am going to do topical antibiotics  provided by [facility] I think the main agents here are vancomycin and an aminoglycoside. We have to be able to give him access to the wounds to get the topical antibiotic on 2/17; moderate amount of drainage this is unchanged. He has his Keystone topical antibiotic against the deep tissue culture organisms. He has been using this and changing the dressing daily. Silver alginate on the wound surface. 2/24; using Keystone antibiotic with silver alginate on the top. He had too much drainage for a total contact cast at one point although I think that is improving and I think in the next week or 2 it might be possible to replace a total contact cast I did not do this today. In general the wound surface looks healthy however he continues to have thick  rims of skin and subcutaneous tissue around the wide area of the circumference which I debrided 06/04/2020 upon evaluation today patient appears to be doing well in regard to his wound. I do feel like he is showing signs of improvement. There is little bit of callus and dead tissue around the edges of the wound as well as what appears to be a little bit of a sinus tract that is off to the side laterally I would perform debridement to clear that away today. 3/17; left lateral foot. The wound looks about the same as I remember. Not much depth surface looks healthy. No evidence of infection 3/25; left lateral foot. Wound surface looks about the same. Separating epithelium from the circumference. There really is no evidence of infection here however not making progress by my view 3/29; left lateral foot. Surface of the wound again looks reasonably healthy still thick skin and subcutaneous tissue around the wound margins. There is no evidence of infection. One of the concerns being brought up by the nurses has again the amount of drainage vis--vis continued use of a total contact cast 4/5; left lateral foot at roughly the base of the fifth metatarsal. Nice healthy  looking granulated tissue with rims of epithelialization. The overall wound measurements are not any better but the tissue looks healthy. The only concern is the amount of drainage although he has no surrounding maceration with what we have been doing recently to absorb fluid and protect his skin. He also has lymphedema. 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 He states that the kerlix/coban under the TCC rolled down and could not get it back up. He has been using an offloading scooter and has somehow hurt his right foot using this device. This happened last week. He states that the side of his right foot developed a blister and opened. The top of his foot also has a few small open wounds he thinks is due to his socks rubbing in his shoes. He has not been using any dressings to the wound. He denies purulent drainage, fever/chills or erythema to the wounds. 4/22; patient presents for 1 week follow-up. He developed new wounds to the right foot that were evaluated at last clinic visit. He continues to have a total contact cast to the left leg and he reports no issues. He has been using silver collagen to the right foot wounds with no issues. He denies purulent drainage, fever/chills or erythema to the right foot wounds. He has no complaints today 4/25; patient presents for 1 week follow-up. He has a total contact cast of the left leg and reports no issues. He has been using silver alginate to the right foot wound. He denies purulent drainage, fever/chills or erythema to the right foot wounds. 5/2 patient presents for 1 week follow-up. T contact cast on the left. The wound which is on the base of the plantar foot at the base of the fifth  metatarsal otal actually looks quite good and dimensions continue to gradually contract. HOWEVER the area on the right lateral fifth metatarsal head is much larger than what I remember from 2 weeks ago. Once more is he has significant levels of hypergranulation. Noteworthy that he had this same hyper  granulated response on his wound on the left foot at one point in time. So much so that he I thought there was an underlying fluid collection. Based on this I think this just needs debridement. 5/9; the wound on the left actually continues to be gradually smaller with a healthy surface. Slight amount of drainage and maceration of the skin around but not too bad. However he has a large wound over the right fifth metatarsal head very much in the same configuration as his left foot wound was initially. I used silver nitrate to address the hyper granulated tissue no mechanical debridement 5/16; area on the left foot did not look as healthy this week deeper thick surrounding macerated skin and subcutaneous tissue. The area on the right foot fifth met head was about the same The area on the right ankle that we identified last week is completely broken down into an open wound presumably a stocking rubbing issue 5/23; patient has been using a total contact cast to the left side. He has been using silver alginate underneath. He has also been using silver alginate to the right foot wounds. He has no complaints today. He denies any signs of infection. Electronic Signature(s) Signed: 08/24/2020 4:59:14 PM By: [name] DO Entered By: [name] on 08/24/2020 16:54:38 -------------------------------------------------------------------------------- Physical Exam Details Patient Name: Date of Service: [name] 08/24/2020 3:15 PM Medical Record Number: [id number] Patient Account Number: [id number] Date of Birth/Sex: Treating RN: August 01, 1985 (35 y.o. M) Primary Care Provider: [name]  Other Clinician: Referring Provider: Treating Provider/Extender: [name] in Treatment: 30 Constitutional respirations regular, non-labored and within target range for patient.. Cardiovascular 2+ dorsalis pedis/posterior tibialis pulses. Psychiatric pleasant and cooperative. Notes Left foot: Plantar lateral aspect has circumferential callus with maceration. There is granulation tissue present. Right foot: The right fifth metatarsal head has circumferential callus with maceration and granulation tissue present. The dorsal wound has fibrinous tissue. No acute signs of infection. Electronic Signature(s) Signed: 08/24/2020 4:59:14 PM By: [name] DO Entered By: [name] on 08/24/2020 16:56:24 -------------------------------------------------------------------------------- Physician Orders Details Patient Name: Date of Service: [name], [name] 08/24/2020 3:15 PM Medical Record Number: [id number] Patient Account Number: [id number] Date of Birth/Sex: Treating RN: Dec 23, 1985 (35 y.o. 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 - ***60 minutes for cast*** - with Dr. Dellia Nims Return A Bathing/ Shower/ Hygiene May shower with protection but do not get wound dressing(s) wet. - use a cast protector to left leg. Edema Control - Lymphedema / SCD / Other Bilateral Lower Extremities Elevate  legs to the level of the heart or above for 30 minutes daily and/or when sitting, a frequency of: - throughout the day Avoid standing for long periods of time. Exercise regularly Moisturize legs daily. - right leg every night before bed. Compression stocking or Garment 20-30 mm/Hg pressure to: - Apply to right leg in the morning and remove at night. Off-Loading Total Contact Cast to Left Lower Extremity Open toe surgical shoe to: - right foot Wound  Treatment Wound #3 - Foot Wound Laterality: Left, Lateral Cleanser: Soap and Water 1 x Per Week/30 Days Discharge Instructions: May shower and wash wound with dial antibacterial soap and water prior to dressing change. Peri-Wound Care: Zinc Oxide Ointment 30g tube 1 x Per Week/30 Days Discharge Instructions: Apply Zinc Oxide to periwound with each dressing change as needed. Prim Dressing: KerraCel Ag Gelling Fiber Dressing, 2x2 in (silver alginate) 1 x Per Week/30 Days ary Discharge Instructions: Apply silver alginate to wound bed as instructed Secondary Dressing: Woven Gauze Sponge, Non-Sterile 4x4 in 1 x Per Week/30 Days Discharge Instructions: Apply over primary dressing as directed. Secondary Dressing: ABD Pad, 8x10 1 x Per Week/30 Days Discharge Instructions: T down before applying kerlix. Apply over primary dressing as directed. ape Secondary Dressing: Zetuvit Plus 4x8 in 1 x Per Week/30 Days Discharge Instructions: Apply over primary dressing as directed. Secured With: [employer], 4.5x3.1 (in/yd) 1 x Per Week/30 Days Discharge Instructions: Secure with Kerlix as directed. Wound #5 - Metatarsal head fifth Wound Laterality: Right, Lateral Cleanser: Normal Saline (Generic) Every Other Day/15 Days Discharge Instructions: Cleanse the wound with Normal Saline prior to applying a clean dressing using gauze sponges, not tissue or cotton balls. Cleanser: Soap and Water Every Other Day/15 Days Discharge Instructions: May shower and wash  wound with dial antibacterial soap and water prior to dressing change. Prim Dressing: KerraCel Ag Gelling Fiber Dressing, 2x2 in (silver alginate) (Generic) Every Other Day/15 Days ary Discharge Instructions: Apply silver alginate to wound bed as instructed Secondary Dressing: ABD Pad, 8x10 (Generic) Every Other Day/15 Days Discharge Instructions: Apply over primary dressing as directed. Secondary Dressing: Optifoam Non-Adhesive Dressing, 4x4 in (Generic) Every Other Day/15 Days Discharge Instructions: Apply over primary dressing, cut to make foam donut to offload Secured With: Kerlix Roll Sterile, 4.5x3.1 (in/yd) (Generic) Every Other Day/15 Days Discharge Instructions: Secure with Kerlix as directed. Secured With: 106M Medipore H Soft Cloth Surgical Tape, 2x2 (in/yd) (Generic) Every Other Day/15 Days Discharge Instructions: Secure dressing with tape as directed. Wound #7 - Ankle Wound Laterality: Right, Anterior Cleanser: Normal Saline Every Other Day/15 Days Discharge Instructions: Cleanse the wound with Normal Saline prior to applying a clean dressing using gauze sponges, not tissue or cotton balls. Cleanser: Soap and Water Every Other Day/15 Days Discharge Instructions: May shower and wash wound with dial antibacterial soap and water prior to dressing change. Prim Dressing: KerraCel Ag Gelling Fiber Dressing, 2x2 in (silver alginate) Every Other Day/15 Days ary Discharge Instructions: Apply silver alginate to wound bed as instructed Secondary Dressing: Woven Gauze Sponge, Non-Sterile 4x4 in Every Other Day/15 Days Discharge Instructions: Apply over primary dressing as directed. Secondary Dressing: ABD Pad, 8x10 Every Other Day/15 Days Discharge Instructions: Apply over primary dressing as directed. Secured With: [employer], 4.5x3.1 (in/yd) Every Other Day/15 Days Discharge Instructions: Secure with Kerlix as directed. Secured With: 106M Medipore H Soft Cloth Surgical Tape, 2x2  (in/yd) Every Other Day/15 Days Discharge Instructions: Secure dressing with tape as directed. Electronic Signature(s) Signed: 08/24/2020 4:59:14 PM By: [name] DO Entered By: [name] on 08/24/2020 16:56:38 -------------------------------------------------------------------------------- Problem List Details Patient Name: Date of Service: [name], [name] 08/24/2020 3:15 PM Medical Record Number: [id number] Patient Account Number: [id number] Date of Birth/Sex: Treating RN: 1985-06-14 (35 y.o. 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 08/10/2020 No Yes Inactive Problems ICD-10 Code Description Active Date Inactive Date L97.518 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ity 10/24/2019 10/24/2019 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, right ankle and foot 10/24/2019 10/24/2019 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, left ankle and foot 10/24/2019 10/24/2019 B95.62 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 10/24/2019 10/24/2019 classified elsewhere Resolved Problems Electronic Signature(s) Signed: 08/24/2020 4:59:14 PM By: [name] DO Signed: 08/24/2020 4:59:14 PM By: [name] DO Entered By: [name] on 08/24/2020  16:52:48 -------------------------------------------------------------------------------- Progress Note Details Patient Name: Date of Service: [name], [name] 08/24/2020 3:15 PM Medical Record Number: [id number] Patient Account Number: [id number] Date of Birth/Sex: Treating RN: 1986/03/19 (35 y.o. 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 He comes in for review of a wound over the plantar fifth metatarsal head extending into the lateral part of the foot. He was followed for this previously by his podiatrist Dr. Cornelius Moras. As the patient tells his story he went to see podiatry first for a swelling he developed on the lateral part of his fifth metatarsal head in May. He states this was "open" by podiatry and the area closed. He was followed up in June and it was again opened callus removed and it closed promptly. There were plans being made for surgery on the fifth metatarsal head in June however his blood sugar was apparently too high for anesthesia. Apparently the area was debrided and opened again in June and it is never closed since. Looking over the records from podiatry I am really not able to follow this. It was clear when he was first seen it was before 5/14 at that point he already had a wound. By 5/17 the ulcer was resolved. I do not see anything about a procedure. On 5/28 noted to have pre-ulcerative  moderate keratosis. X-ray noted 1/5 contracted toe and tailor's bunion and metatarsal deformity. On a visit date on 09/28/2018 the dorsal part of the left foot it healed and resolved. There was concern about swelling in his lower extremity he was sent to the ER.. As far as I can tell he was seen in the ER on 7/12 with an ulcer on his left foot. A DVT rule out of the left leg was negative. I do not think I have complete records from podiatry but I am not able to verify the procedures this patient states he had. He states after the last procedure the wound has never closed although I am not able to follow this in the records I have from podiatry. He has not had a recent x-ray The patient has been using Neosporin on the wound. He is wearing a Darco  shoe. He is still very active up on his foot working and exercising. Past medical history; type 2 diabetes ketosis-prone, leg swelling with a negative DVT study in July. Non-smoker ABI in our clinic was 0.85 on the left 10/16; substantial wound on the plantar left fifth met head extending laterally almost to the dorsal fifth MTP. We have been using silver alginate we gave him a Darco forefoot off loader. An x-ray did not show evidence of osteomyelitis did note soft tissue emphysema which I think was due to gas tracking through an open wound. There is no doubt in my mind he requires an MRI 10/23; MRI not booked until 3 November at the earliest this is largely due to his glucose sensor in the right arm. We have been using silver alginate. There has been an improvement 10/29; I am still not exactly sure when his MRI is booked for. He says it is the third but it is the 10th in epic. This definitely needs to be done. He is running a low-grade fever today but no other symptoms. No real improvement in the 1 02/26/2019 patient presents today for a follow-up visit here in our clinic he is last been seen in the clinic on October 29. Subsequently we were working  on getting MRI to evaluate and see what exactly was going on and where we would need to go from the standpoint of whether or not he had osteomyelitis and again what treatments were going be required. Subsequently the patient ended up being admitted to the hospital on 02/07/2019 and was discharged on 02/14/2019. This is a somewhat interesting admission with a discharge diagnosis of pneumonia due to COVID-19 although he was positive for COVID-19 when tested at the urgent care but negative x2 when he was actually in the hospital. With that being said he did have acute respiratory failure with hypoxia and it was noted he also have a left foot ulceration with osteomyelitis. With that being said he did require oxygen for his pneumonia and I level 4 L. He was placed on antivirals and steroids for the COVID-19. He was also transferred to the [place] [facility] at one point. Nonetheless he did subsequently discharged home and since being home has done much better in that regard. The CT angiogram did not show any pulmonary embolism. With regard to the osteomyelitis the patient was placed on vancomycin and Zosyn while in the hospital but has been changed to Augmentin at discharge. It was also recommended that he follow- up with wound care and podiatry. Podiatry however wanted him to see [name] according to the patient prior to them doing anything further. His hemoglobin A1c was 9.9 as noted in the hospital. Have an MRI of the left foot performed while in the hospital on 02/04/2019. 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 There is an overlying plantar open wound noted an abscess tracking back along the lateral aspect of the fifth metatarsal shaft. There is otherwise diffuse cellulitis and mild fasciitis without findings of polymyositis. The patient did have recently pneumonia secondary to COVID-19 I looked in the chart  through epic and it does appear that the patient may need to have an additional x-ray just to ensure everything is cleared and that he has no airspace disease prior to putting him into the [name]. 03/05/2019; patient was readmitted to the clinic last week. He was hospitalized twice for a viral upper respiratory tract  infection from 11/1 through 11/4 and then 11/5 through 11/12 ultimately this turned out to be Covid pneumonitis. Although he was discharged on oxygen he is not using it. He says he feels fine. He has no exercise limitation no cough no sputum. His O2 sat in our clinic today was 100% on room air. 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 He received Vanco and Zosyn in the hospital and then was discharged on 2 weeks of Augmentin. I do not see any relevant cultures. He was supposed to follow-up with infectious disease but I do not see that he has an appointment. 12/8; patient saw Dr. Novella Olive of infectious disease last week. He felt that he had had adequate antibiotic therapy. He did not go to follow-up with Dr. Amalia Hailey of podiatry and I have again talked to him about the pros and cons of this. He does not want to consider a ray amputation of this time. He is aware of the risks of recurrence, migration etc. He started HBO today and tolerated this well. He can complete the Augmentin that I gave him last week. I have looked over the lab work that Dr. Chana Bode ordered his C-reactive protein was 3.3 and his sedimentation rate was 17. The C-reactive protein is never really been measurably that high in this patient 12/15; not much change in the wound today however he has undermining along the lateral part of the foot again more extensively than last week. He has some rims of epithelialization. We have been using silver alginate. He is undergoing hyperbarics but did not dive today 12/18;  in for his obligatory first total contact cast change. Unfortunately there was pus coming from the undermining area around his fifth metatarsal head. This was cultured but will preclude reapplication of a cast. He is seen in conjunction with HBO 12/24; patient had staph lugdunensis in the wound in the undermining area laterally last time. We put him on doxycycline which should have covered this. The wound looks better today. I am going to give him another week of doxycycline before reattempting the total contact cast 12/31; the patient is completing antibiotics. Hemorrhagic debris in the distal part of the wound with some undermining distally. He also had hyper granulation. Extensive debridement with a #5 curette. The infected area that was on the lateral part of the fifth met head is closed over. I do not think he needs any more antibiotics. Patient was seen prior to HBO. Preparations for a total contact cast were made in the cast will be placed post hyperbarics 04/11/19; once again the patient arrives today without complaint. He had been in a cast all week noted that he had heavy drainage this week. This resulted in large raised areas of macerated tissue around the wound 1/14; wound bed looks better slightly smaller. Hydrofera Blue has been changing himself. He had a heavy drainage last week which caused a lot of maceration around the wound so I took him out of a total contact cast he says the drainage is actually better this week He is seen today in conjunction with HBO 1/21; returns to clinic. He was up in [place] for a day or 2 attending a funeral. He comes back in with the wound larger and with a large area of exposed bone. He had osteomyelitis and septic arthritis of the fifth left metatarsal head while he was in hospital. He received  IV antibiotics in the hospital for a prolonged period of time then 3 weeks of Augmentin. Subsequently I gave him 2 weeks of doxycycline for more superficial wound  infection. When I saw this last week the wound was smaller the surface of the wound looks satisfactory. 1/28; patient missed hyperbarics today. Bone biopsy I did last time showed Enterococcus faecalis and Staphylococcus lugdunensis . He has a wide area of exposed bone. We are going to use silver alginate as of today. I had another ethical discussion with the patient. 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 In this situation I think the likelihood of healing this is low. Therefore I have recommended a ray amputation and with the patient's agreement I have referred him to Dr. Doran Durand. The other issue is that his compliance with hyperbarics has been minimal because of his work schedule and given his underlying decision I am going to stop this today READMISSION 10/24/2019 MRI 09/29/2019 left foot IMPRESSION: 1. Apparent skin ulceration inferior and lateral to the 5th metatarsal base with underlying heterogeneous T2 signal and enhancement in the subcutaneous fat. Small peripherally enhancing fluid collections along the plantar and lateral aspects of the 5th metatarsal base suspicious for abscesses. 2. Interval amputation through the mid 5th metatarsal with nonspecific low-level marrow edema and enhancement. Given the proximity to the adjacent soft tissue inflammatory changes, osteomyelitis cannot be excluded. 3. The additional bones appear unremarkable. MRI 09/29/2019 right foot IMPRESSION: 1. Soft tissue ulceration lateral to the 5th MTP joint. There is low-level T2 hyperintensity within the 4th and 5th metatarsal heads and adjacent proximal phalanges without abnormal T1 signal or cortical destruction. These findings are nonspecific and could be seen with early marrow edema, hyperemia or early osteomyelitis. No evidence of septic joint. 2. Mild tenosynovitis and synovial enhancement associated with the extensor digitorum tendons at the level of the  midfoot. 3. Diffuse low-level muscular T2 hyperintensity and enhancement, most consistent with diabetic myopathy. LEFT FOOT BONE Methicillin resistant staphylococcus aureus Staphylococcus lugdunensis MIC MIC CIPROFLOXACIN >=8 RESISTANT Resistant <=0.5 SENSI... Sensitive CLINDAMYCIN <=0.25 SENS... Sensitive >=8 RESISTANT Resistant ERYTHROMYCIN >=8 RESISTANT Resistant >=8 RESISTANT Resistant GENTAMICIN <=0.5 SENSI... Sensitive <=0.5 SENSI... Sensitive Inducible Clindamycin NEGATIVE Sensitive NEGATIVE Sensitive OXACILLIN >=4 RESISTANT Resistant 2 SENSITIVE Sensitive RIFAMPIN <=0.5 SENSI... Sensitive <=0.5 SENSI... Sensitive TETRACYCLINE <=1 SENSITIVE Sensitive <=1 SENSITIVE Sensitive TRIMETH/SULFA <=10 SENSIT Sensitive <=10 SENSIT Sensitive ... Marland Kitchen.. VANCOMYCIN 1 SENSITIVE Sensitive <=0.5 SENSI... Sensitive Right foot bone . Component 3 wk ago Specimen Description BONE Special Requests RIGHT 4 METATARSAL SAMPLE B Gram Stain NO WBC SEEN NO ORGANISMS SEEN Culture RARE METHICILLIN RESISTANT STAPHYLOCOCCUS AUREUS NO ANAEROBES ISOLATED Performed at [facility], [place] [address] [place], [place] [address] Report Status 10/08/2019 FINAL Organism ID, Bacteria METHICILLIN RESISTANT STAPHYLOCOCCUS AUREUS Resulting Agency [facility] Susceptibility Methicillin resistant staphylococcus aureus MIC CIPROFLOXACIN >=8 RESISTANT Resistant CLINDAMYCIN <=0.25 SENS... Sensitive ERYTHROMYCIN >=8 RESISTANT Resistant GENTAMICIN <=0.5 SENSI... Sensitive Inducible Clindamycin NEGATIVE Sensitive OXACILLIN >=4 RESISTANT Resistant RIFAMPIN <=0.5 SENSI... Sensitive TETRACYCLINE <=1 SENSITIVE Sensitive TRIMETH/SULFA <=10 SENSIT Sensitive ... VANCOMYCIN 1 SENSITIVE Sensitive This is a patient we had in clinic earlier this year with a wound over his left fifth metatarsal head. 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 Doran Durand on 05/16/2019. He seems to have developed problems with wounds on his bilateral feet in June 2021 although he may have had problems earlier than this. He was in an urgent care with a right foot ulcer on 09/26/2019 and given a course of doxycycline. This was apparently after having trouble getting into see orthopedics. He was seen by podiatry on 09/28/2019 noted to have bilateral lower extremity ulcers including the left lateral fifth metatarsal base and the right subfifth met head. It was noted that had purulent drainage at that time. He required hospitalization from 6/20 through 7/2. This was because of worsening right foot wounds. He underwent bilateral operative incision and drainage and bone biopsies bilaterally. Culture results are listed above. He has been referred back to clinic by Dr. Jacqualyn Posey of podiatry. He is also followed by Dr. Megan Salon who saw him yesterday. He was discharged from hospital on Zyvox Flagyl and Levaquin and yesterday changed to doxycycline Flagyl and Levaquin. His inflammatory markers on 6/26 showed a sedimentation rate of 129 and a C-reactive protein of 5. This is improved to 14 and 1.3 respectively. This would indicate improvement. ABIs in our clinic today were 1.23 on the right and 1.20 on the left 11/01/2019 on evaluation today patient appears to be doing fairly well in regard to the wounds on his feet at this point. Fortunately there is no signs of active infection at this time. No fevers, chills, nausea, vomiting, or diarrhea. He currently is seeing infectious disease and still under their care at this point. Subsequently he also has both wounds which she has not been using collagen on as he did not receive that in his packaging he did not call us and let us know that.  Apparently that just was missed on the order. Nonetheless we will get that straightened out today. 8/9-Patient returns for bilateral foot wounds, using Prisma with hydrogel moistened dressings, and the wounds appear stable. Patient using surgical shoes, avoiding much pressure or weightbearing as much as possible 8/16; patient has bilateral foot wounds. 1 on the right lateral foot proximally the other is on the left mid lateral foot. Both required debridement of callus and thick skin around the wounds. We have been using silver collagen 8/27; patient has bilateral lateral foot wounds. The area on the left substantially surrounded by callus and dry skin. This was removed from the wound edge. The underlying wound is small. The area on the right measured somewhat smaller today. 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 Unfortunately I did not update his antibiotics during today's visit. 9/10 I reviewed Dr. Hale Bogus last notes he felt he had completed antibiotics his inflammatory markers were reasonably well controlled. He has a small wound on the lateral left foot and a tiny area on the right which is just above closed. He is using Hydrofera Blue with border foam he has bilateral surgical shoes 9/24; 2 week f/u. doing well. right foot is closed. left foot still undermined. 10/14; right foot remains closed at the fifth met head. The area over the base of the left fifth metatarsal has a small open area but considerable undermining towards the plantar foot. Thick callus skin around this suggests an adequate pressure relief. We have talked about this. He says he is going to go back into his cam boot. I suggested a total contact cast he did not seem enamored with this suggestion 10/26; left foot base of the fifth  metatarsal. Same condition as last time. He has skin over the area with an open wound however the skin is not adherent. He went  to see Dr. Earleen Newport who did an x-ray and culture of his foot I have not reviewed the x-ray but the patient was not told anything. He is on doxycycline 11/11; since the patient was last here he was in the emergency room on 10/30 he was concerned about swelling in the left foot. They did not do any cultures or x-rays. They changed his antibiotics to cephalexin. Previous culture showed group B strep. The cephalexin is appropriate as doxycycline has less than predictable coverage. Arrives in clinic today with swelling over this area under the wound. He also has a new wound on the right fifth metatarsal head 11/18; the patient has a difficult wound on the lateral aspect of the left fifth metatarsal head. The wound was almost ballotable last week I opened it slightly expecting to see purulence however there was just bleeding. I cultured this this was negative. X-ray unchanged. We are trying to get an MRI but I am not sure were going to be able to get this through his insurance. He also has an area on the right lateral fifth metatarsal head this looks healthier 12/3; the patient finally got our MRI. Surprisingly this did not show osteomyelitis. I did show the soft tissue ulceration at the lateral plantar aspect of the fifth metatarsal base with a tiny residual 6 mm abscess overlying the superficial fascia I have tried to culture this area I have not been able to get this to grow anything. Nevertheless the protruding tissue looks aggravated. I suspect we should try to treat the underlying "abscess with broad-spectrum antibiotics. I am going to start him on Levaquin and Flagyl. He has much less edema in his legs and I am going to continue to wrap his legs and see him weekly 12/10. I started Levaquin and Flagyl on him last week. He just picked up the Flagyl apparently there was some delay. The worry is the wound on the left fifth metatarsal base which is substantial and worsening. His foot looks like he inverts at  the ankle making this a weightbearing surface. Certainly no improvement in fact I think the measurements of this are somewhat worse. We have been using 12/17; he apparently just got the Levaquin yesterday this is 2 weeks after the fact. He has completed the Flagyl. The area over the left fifth metatarsal base still has protruding granulation tissue although it does not look quite as bad as it did some weeks ago. He has severe bilateral lymphedema although we have not been treating him for wounds on his legs this is definitely going to require compression. There was so much edema in the left I did not wish to put him in a total contact cast today. I am going to increase his compression from 3-4 layer. The area on the right lateral fifth met head actually look quite good and superficial. 12/23; patient arrived with callus on the right fifth met head and the substantial hyper granulated callused wound on the base of his fifth metatarsal. He says he is completing his Levaquin in 2 days but I do not think that adds up with what I gave him but I will have to double check this. We are using Hydrofera Blue on both areas. My plan is to put the left leg in a cast the week after New Year's 04/06/2020; patient's wounds about the same.  Right lateral fifth metatarsal head and left lateral foot over the base of the fifth metatarsal. There is undermining on the left lateral foot which I removed before application of total contact cast continuing with Hydrofera Blue new. Patient tells me he was seen by endocrinology today lab work was done [Dr. Kerr]. Also wondering whether he was referred to cardiology. I went over some lab work from previously does not have chronic renal failure certainly not nephrotic range proteinuria he does have very poorly controlled diabetes but this is not his most updated lab work. Hemoglobin A1c has been over 11 1/10; the patient had a considerable amount of leakage towards mid part of his  left foot with macerated skin however the wound surface looks better the area on the right lateral fifth met head is better as well. I am going to change the dressing on the left foot under the total contact cast to silver alginate, continue with Hydrofera Blue on the right. 1/20; patient was in the total contact cast for 10 days. Considerable amount of drainage although the skin around the wound does not look too bad on the left foot. The area on the right fifth metatarsal head is closed. Our nursing staff reports large amount of drainage out of the left lateral foot wound 1/25; continues with copious amounts of drainage described by our intake staff. PCR culture I did last week showed E. coli and Enterococcus faecalis and low quantities. Multiple resistance genes documented including extended spectrum beta lactamase, MRSA, MRSE, quinolone, tetracycline. The wound is not quite as good this week as it was 5 days ago but about the same size 2/3; continues with copious amounts of malodorous drainage per our intake nurse. The PCR culture I did 2 weeks ago showed E. coli and low quantities of Enterococcus. There were multiple resistance genes detected. I put Neosporin on him last week although this does not seem to have helped. The wound is slightly deeper today. Offloading continues to be an issue here although with the amount of drainage she has a total contact cast is just not going to work 2/10; moderate amount of drainage. Patient reports he cannot get his stocking on over the dressing. I told him we have to do that the nurse gave him suggestions on how to make this work. The wound is on the bottom and lateral part of his left foot. Is cultured predominantly grew low amounts of Enterococcus, E. coli and anaerobes. There were multiple resistance genes detected including extended spectrum beta lactamase, quinolone, tetracycline. I could not think of an easy oral combination to address this so for now I  am going to do topical antibiotics provided by [facility] I think the main agents here are vancomycin and an aminoglycoside. We have to be able to give him access to the wounds to get the topical antibiotic on 2/17; moderate amount of drainage this is unchanged. He has his Keystone topical antibiotic against the deep tissue culture organisms. He has been using this and changing the dressing daily. Silver alginate on the wound surface. 2/24; using Keystone antibiotic with silver alginate on the top. He had too much drainage for a total contact cast at one point although I think that is improving and I think in the next week or 2 it might be possible to replace a total contact cast I did not do this today. In general the wound surface looks healthy however he continues to have thick rims of skin and subcutaneous tissue around  the wide area of the circumference which I debrided 06/04/2020 upon evaluation today patient appears to be doing well in regard to his wound. I do feel like he is showing signs of improvement. There is little bit of callus and dead tissue around the edges of the wound as well as what appears to be a little bit of a sinus tract that is off to the side laterally I would perform debridement to clear that away today. 3/17; left lateral foot. The wound looks about the same as I remember. Not much depth surface looks healthy. No evidence of infection 3/25; left lateral foot. Wound surface looks about the same. Separating epithelium from the circumference. There really is no evidence of infection here however not making progress by my view 3/29; left lateral foot. Surface of the wound again looks reasonably healthy still thick skin and subcutaneous tissue around the wound margins. There is no evidence of infection. One of the concerns being brought up by the nurses has again the amount of drainage vis--vis continued use of a total contact cast 4/5; left lateral foot at roughly the base of  the fifth metatarsal. Nice healthy looking granulated tissue with rims of epithelialization. The overall wound measurements are not any better but the tissue looks healthy. The only concern is the amount of drainage although he has no surrounding maceration with what we have been doing recently to absorb fluid and protect his skin. He also has lymphedema. 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 He states that the kerlix/coban under the TCC rolled down and could not get it back up. He has been using an offloading scooter and has somehow hurt his right foot using this device. This happened last week. He states that the side of his right foot developed a blister and opened. The top of his foot also has a few small open wounds he thinks is due to his socks rubbing in his shoes. He has not been using any dressings to the wound. He denies purulent drainage, fever/chills or erythema to the wounds. 4/22; patient presents for 1 week follow-up. He developed new wounds to the right foot that were evaluated at last clinic visit. He continues to have a total contact cast to the left leg and he reports no issues. He has been using silver collagen to the right foot wounds with no issues. He denies purulent drainage, fever/chills or erythema to the right foot wounds. He has no complaints today 4/25; patient presents for 1 week follow-up. He has a total contact cast of the left leg and reports no issues. He has been using silver alginate to the right foot wound. He denies purulent drainage, fever/chills or erythema to the right foot wounds. 5/2 patient presents for 1 week follow-up. T contact cast on the left. The wound which is on the base of the plantar foot at the base  of the fifth metatarsal otal actually looks quite good and dimensions continue to gradually contract. HOWEVER the area on the right lateral fifth metatarsal head is much larger than what I remember from 2 weeks ago. Once more is he has significant levels of hypergranulation. Noteworthy that he had this same hyper granulated response on his wound on the  left foot at one point in time. So much so that he I thought there was an underlying fluid collection. Based on this I think this just needs debridement. 5/9; the wound on the left actually continues to be gradually smaller with a healthy surface. Slight amount of drainage and maceration of the skin around but not too bad. However he has a large wound over the right fifth metatarsal head very much in the same configuration as his left foot wound was initially. I used silver nitrate to address the hyper granulated tissue no mechanical debridement 5/16; area on the left foot did not look as healthy this week deeper thick surrounding macerated skin and subcutaneous tissue. oo The area on the right foot fifth met head was about the same oo The area on the right ankle that we identified last week is completely broken down into an open wound presumably a stocking rubbing issue 5/23; patient has been using a total contact cast to the left side. He has been using silver alginate underneath. He has also been using silver alginate to the right foot wounds. He has no complaints today. He denies any signs of infection. Patient History Information obtained from Patient. Family History No family history of Cancer, Diabetes, Hereditary Spherocytosis, Hypertension, Kidney Disease, Lung Disease, Seizures, Stroke, Thyroid Problems, Tuberculosis. Social History Never smoker, Marital Status - Single, Alcohol Use - Rarely, Drug Use - No History, Caffeine Use - Never. Medical History Eyes Denies history of Cataracts, Glaucoma, Optic  Neuritis Ear/Nose/Mouth/Throat Denies history of Chronic sinus problems/congestion, Middle ear problems Hematologic/Lymphatic Denies history of Anemia, Hemophilia, Human Immunodeficiency Virus, Lymphedema, Sickle Cell Disease Respiratory Denies history of Aspiration, Asthma, Chronic Obstructive Pulmonary Disease (COPD), Pneumothorax, Sleep Apnea, Tuberculosis Cardiovascular Denies history of Angina, Arrhythmia, Congestive Heart Failure, Coronary Artery Disease, Deep Vein Thrombosis, Hypertension, Hypotension, Myocardial Infarction, Peripheral Arterial Disease, Peripheral Venous Disease, Phlebitis, Vasculitis Gastrointestinal Denies history of Cirrhosis , Colitis, Crohnoos, Hepatitis A, Hepatitis B, Hepatitis C Endocrine Patient has history of Type II Diabetes Denies history of Type I Diabetes Immunological Denies history of Lupus Erythematosus, Raynaudoos, Scleroderma Integumentary (Skin) Denies history of History of Burn Musculoskeletal Denies history of Gout, Rheumatoid Arthritis, Osteoarthritis, Osteomyelitis Neurologic Denies history of Dementia, Neuropathy, Quadriplegia, Paraplegia, Seizure Disorder Oncologic Denies history of Received Chemotherapy, Received Radiation Psychiatric Denies history of Anorexia/bulimia, Confinement Anxiety Hospitalization/Surgery History - 11/1-11/06/2018- sepsis foot infection. - 11/4-11/5 02 sats low respiratory distress. Objective Constitutional respirations regular, non-labored and within target range for patient.. Vitals Time Taken: 3:55 PM, Height: 77 in, Weight: 280 lbs, BMI: 33.2, Temperature: 98.4 F, Pulse: 89 bpm, Respiratory Rate: 16 breaths/min, Blood Pressure: 125/83 mmHg, Capillary Blood Glucose: 124 mg/dl. General Notes: Glucose per patient report. Cardiovascular 2+ dorsalis pedis/posterior tibialis pulses. Psychiatric pleasant and cooperative. General Notes: Left foot: Plantar lateral aspect has circumferential callus with  maceration. There is granulation tissue present. Right foot: The right fifth metatarsal head has circumferential callus with maceration and granulation tissue present. The dorsal wound has fibrinous tissue. No acute signs of infection. Integumentary (Hair, Skin) Wound #3 status is Open. Original cause of wound was Trauma. The date acquired was: 10/02/2019. The wound has been in treatment 43 weeks. The wound is located on the Left,Lateral Foot. The wound measures 2.4cm length x 2.2cm width x 0.5cm depth; 4.147cm^2 area and 2.073cm^3 volume. There is Fat Layer (Subcutaneous Tissue) exposed. There is no tunneling or undermining noted. There is a medium amount of serosanguineous drainage noted. The wound margin is well defined and  not attached to the wound base. There is large (67-100%) red granulation within the wound bed. There is no necrotic tissue within the wound bed. Wound #5 status is Open. Original cause of wound was Gradually Appeared. The date acquired was: 07/07/2020. The wound has been in treatment 5 weeks. The wound is located on the Right,Lateral Metatarsal head fifth. The wound measures 3cm length x 3cm width x 0.2cm depth; 7.069cm^2 area and 1.414cm^3 volume. There is Fat Layer (Subcutaneous Tissue) exposed. There is a medium amount of serosanguineous drainage noted. The wound margin is well defined and not attached to the wound base. There is large (67-100%) red, pink, hyper - granulation within the wound bed. There is a small (1-33%) amount of necrotic tissue within the wound bed including Adherent Slough. Wound #7 status is Open. Original cause of wound was Gradually Appeared. The date acquired was: 08/17/2020. The wound has been in treatment 1 weeks. The wound is located on the Right,Anterior Ankle. The wound measures 2cm length x 2.4cm width x 0.1cm depth; 3.77cm^2 area and 0.377cm^3 volume. There is Fat Layer (Subcutaneous Tissue) exposed. There is a medium amount of serosanguineous  drainage noted. The wound margin is flat and intact. There is large (67-100%) pink granulation within the wound bed. There is a small (1-33%) amount of necrotic tissue within the wound bed including Adherent Slough. 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 Janalyn Rouse. Patient's wounds are overall stable Since last clinic visit. There are no signs of infection. All wounds were debrided. We will continue with silver alginate to all wounds And TCC to the left. Procedures Wound #3 Pre-procedure diagnosis of Wound #3 is a Diabetic Wound/Ulcer of the Lower Extremity located on the Left,Lateral Foot .Severity of Tissue Pre Debridement is: Fat layer exposed. There was a Excisional Skin/Subcutaneous Tissue Debridement with a total area of 5.28 sq cm performed by [name], DO. With the following instrument(s): Curette to remove Viable and Non-Viable tissue/material. Material removed includes Callus, Subcutaneous Tissue, and Slough. No specimens were taken. A time out was conducted at 16:43, prior to the start of the procedure. A Minimum amount of bleeding was controlled with Pressure. The procedure was tolerated well with a pain level of 0 throughout and a pain level of 0 following the procedure. Post Debridement Measurements: 2.4cm length x 2.2cm width x 0.5cm depth; 2.073cm^3 volume. Character of Wound/Ulcer Post Debridement is stable. Severity of Tissue Post Debridement is: Fat layer exposed. Post procedure Diagnosis Wound #3: Same as Pre-Procedure Pre-procedure diagnosis of Wound #3 is a Diabetic Wound/Ulcer of the Lower Extremity located on the Left,Lateral Foot . There was a T Sports coach otal Procedure by [name], DO. Post procedure Diagnosis Wound #3: Same as Pre-Procedure Wound #5 Pre-procedure diagnosis of Wound #5 is a Diabetic Wound/Ulcer of the Lower Extremity located on the Right,Lateral Metatarsal head fifth .Severity of Tissue Pre Debridement is: Fat layer exposed. There was a Excisional Skin/Subcutaneous Tissue Debridement with a total area of 9 sq cm performed by [name], DO. With the following instrument(s): Curette to remove Viable and Non-Viable tissue/material. Material removed includes Callus, Subcutaneous Tissue, and Slough. No specimens were taken. A time out was conducted at 16:43, prior to the start of  the procedure. A Moderate amount of bleeding was controlled with Silver Nitrate. The procedure was tolerated well with a pain level of 0 throughout and a pain level of 0 following the procedure. Post Debridement Measurements: 3cm length x 3cm width x 0.2cm depth; 1.414cm^3 volume. Character of Wound/Ulcer Post Debridement is stable. Severity of Tissue Post Debridement is: Fat layer exposed. Post procedure Diagnosis Wound #5: Same as Pre-Procedure Wound #7 Pre-procedure diagnosis of Wound #7 is a Diabetic Wound/Ulcer of the Lower Extremity located on the Right,Anterior Ankle .Severity of Tissue Pre Debridement is: Fat layer exposed. There was a Excisional Skin/Subcutaneous Tissue Debridement with a total area of 4.8 sq cm performed by [name], DO. With the following instrument(s): Curette to remove Viable and Non-Viable tissue/material. Material removed includes Subcutaneous Tissue and Slough and. No specimens were taken. A time out was conducted at 16:43, prior to the start of the procedure. A Minimum amount of bleeding was controlled with Pressure. The procedure was tolerated well with a pain level of 0 throughout and a pain level of 0 following the procedure. Post Debridement Measurements: 2cm length x 2.4cm width x 0.1cm depth;  0.377cm^3 volume. Character of Wound/Ulcer Post Debridement is stable. Severity of Tissue Post Debridement is: Fat layer exposed. Post procedure Diagnosis Wound #7: Same as Pre-Procedure Plan Follow-up Appointments: Return Appointment in 1 week. - ***60 minutes for cast*** - with Dr. Arcola Jansky Shower/ Hygiene: May shower with protection but do not get wound dressing(s) wet. - use a cast protector to left leg. Edema Control - Lymphedema / SCD / Other: Elevate legs to the level of the heart or above for 30 minutes daily and/or when sitting, a frequency of: - throughout the day Avoid standing for long periods of time. Exercise regularly Moisturize legs daily. - right leg every night before bed. Compression stocking or Garment 20-30 mm/Hg pressure to: - Apply to right leg in the morning and remove at night. Off-Loading: T Contact Cast to Left Lower Extremity otal Open toe surgical shoe to: - right foot WOUND #3: - Foot Wound Laterality: Left, Lateral Cleanser: Soap and Water 1 x Per Week/30 Days Discharge Instructions: May shower and wash wound with dial antibacterial soap and water prior to dressing change. Peri-Wound Care: Zinc Oxide Ointment 30g tube 1 x Per Week/30 Days Discharge Instructions: Apply Zinc Oxide to periwound with each dressing change as needed. Prim Dressing: KerraCel Ag Gelling Fiber Dressing, 2x2 in (silver alginate) 1 x Per Week/30 Days ary Discharge Instructions: Apply silver alginate to wound bed as instructed Secondary Dressing: Woven Gauze Sponge, Non-Sterile 4x4 in 1 x Per Week/30 Days Discharge Instructions: Apply over primary dressing as directed. Secondary Dressing: ABD Pad, 8x10 1 x Per Week/30 Days Discharge Instructions: T down before applying kerlix. Apply over primary dressing as directed. ape Secondary Dressing: Zetuvit Plus 4x8 in 1 x Per Week/30 Days Discharge Instructions: Apply over primary dressing as directed. Secured With: [employer], 4.5x3.1 (in/yd) 1 x Per Week/30 Days Discharge Instructions: Secure with Kerlix as directed. WOUND #5: - Metatarsal head fifth Wound Laterality: Right, Lateral Cleanser: Normal Saline (Generic) Every Other Day/15 Days Discharge Instructions: Cleanse the wound with Normal Saline prior to applying a clean dressing using gauze sponges, not tissue or cotton balls. Cleanser: Soap and Water Every Other Day/15 Days Discharge Instructions: May shower and wash wound with dial antibacterial soap and water prior to dressing change. Prim Dressing: KerraCel Ag Gelling Fiber Dressing, 2x2 in (silver alginate) (Generic) Every Other  Day/15 Days ary Discharge Instructions: Apply silver alginate to wound bed as instructed Secondary Dressing: ABD Pad, 8x10 (Generic) Every Other Day/15 Days Discharge Instructions: Apply over primary dressing as directed. Secondary Dressing: Optifoam Non-Adhesive Dressing, 4x4 in (Generic) Every Other Day/15 Days Discharge Instructions: Apply over primary dressing, cut to make foam donut to offload Secured With: Kerlix Roll Sterile, 4.5x3.1 (in/yd) (Generic) Every Other Day/15 Days Discharge Instructions: Secure with Kerlix as directed. Secured With: 435M Medipore H Soft Cloth Surgical T ape, 2x2 (in/yd) (Generic) Every Other Day/15 Days Discharge Instructions: Secure dressing with tape as directed. WOUND #7: - Ankle Wound Laterality: Right, Anterior Cleanser: Normal Saline Every Other Day/15 Days Discharge Instructions: Cleanse the wound with Normal Saline prior to applying a clean dressing using gauze sponges, not tissue or cotton balls. Cleanser: Soap and Water Every Other Day/15 Days Discharge Instructions: May shower and wash wound with dial antibacterial soap and water prior to dressing change. Prim Dressing: KerraCel Ag Gelling Fiber Dressing, 2x2 in (silver alginate) Every Other Day/15 Days ary Discharge Instructions: Apply silver alginate to wound bed as  instructed Secondary Dressing: Woven Gauze Sponge, Non-Sterile 4x4 in Every Other Day/15 Days Discharge Instructions: Apply over primary dressing as directed. Secondary Dressing: ABD Pad, 8x10 Every Other Day/15 Days Discharge Instructions: Apply over primary dressing as directed. Secured With: [employer], 4.5x3.1 (in/yd) Every Other Day/15 Days Discharge Instructions: Secure with Kerlix as directed. Secured With: 435M Medipore H Soft Cloth Surgical T ape, 2x2 (in/yd) Every Other Day/15 Days Discharge Instructions: Secure dressing with tape as directed. 1. T contact cast with silver alginate otal 2. Silver alginate to the right foot wounds 3. In office sharp debridement 4. Follow-up in 1 week with Dr. Dellia Nims Electronic Signature(s) Signed: 08/24/2020 4:59:14 PM By: [name] DO Entered By: [name] on 08/24/2020 16:58:31 -------------------------------------------------------------------------------- HxROS Details Patient Name: Date of Service: [name], [name] 08/24/2020 3:15 PM Medical Record Number: [id number] Patient Account Number: [id number] Date of Birth/Sex: Treating RN: 10-19-1985 (35 y.o. M) Primary Care Provider: [name] Other Clinician: Referring Provider: Treating Provider/Extender: [name] in Treatment: 78 Information Obtained From Patient Eyes Medical History: Negative for: Cataracts; Glaucoma; Optic Neuritis Ear/Nose/Mouth/Throat Medical History: Negative for: Chronic sinus problems/congestion; Middle ear problems Hematologic/Lymphatic Medical History: Negative for: Anemia; Hemophilia; Human Immunodeficiency Virus; Lymphedema; Sickle Cell Disease Respiratory Medical History: Negative for: Aspiration; Asthma; Chronic Obstructive Pulmonary Disease (COPD); Pneumothorax; Sleep Apnea; Tuberculosis Cardiovascular Medical History: Negative for: Angina; Arrhythmia; Congestive Heart Failure; Coronary Artery  Disease; Deep Vein Thrombosis; Hypertension; Hypotension; Myocardial Infarction; Peripheral Arterial Disease; Peripheral Venous Disease; Phlebitis; Vasculitis Gastrointestinal Medical History: Negative for: Cirrhosis ; Colitis; Crohns; Hepatitis A; Hepatitis B; Hepatitis C Endocrine Medical History: Positive for: Type II Diabetes Negative for: Type I Diabetes Time with diabetes: 8 Treated with: Insulin Blood sugar tested every day: No Immunological Medical History: Negative for: Lupus Erythematosus; Raynauds; Scleroderma Integumentary (Skin) Medical History: Negative for: History of Burn Musculoskeletal Medical History: Negative for: Gout; Rheumatoid Arthritis; Osteoarthritis; Osteomyelitis Neurologic Medical History: Negative for: Dementia; Neuropathy; Quadriplegia; Paraplegia; Seizure Disorder Oncologic Medical History: Negative for: Received Chemotherapy; Received Radiation Psychiatric Medical History: Negative for: Anorexia/bulimia; Confinement Anxiety Immunizations Pneumococcal Vaccine: Received Pneumococcal Vaccination: No Implantable Devices None Hospitalization / Surgery History Type of Hospitalization/Surgery 11/1-11/06/2018- sepsis foot infection 11/4-11/5 02 sats low respiratory distress Family and Social History Cancer: No; Diabetes: No; Hereditary Spherocytosis: No; Hypertension: No; Kidney Disease: No; Lung Disease: No; Seizures: No; Stroke: No; Thyroid Problems: No; Tuberculosis:  No; Never smoker; Marital Status - Single; Alcohol Use: Rarely; Drug Use: No History; Caffeine Use: Never; Financial Concerns: No; Food, Clothing or Shelter Needs: No; Support System Lacking: No; Transportation Concerns: No Electronic Signature(s) Signed: 08/24/2020 4:59:14 PM By: [name] DO Entered By: [name] on 08/24/2020 16:54:46 -------------------------------------------------------------------------------- Total Contact Cast Details Patient Name: Date of  Service: [name] 08/24/2020 3:15 PM Medical Record Number: [id number] Patient Account Number: [id number] Date of Birth/Sex: Treating RN: [birth year] (35 y.o. [name] Primary Care Provider: [name] Other Clinician: Referring Provider: Treating Provider/Extender: [name] in Treatment: 73 T Contact Cast Applied for Wound Assessment: otal Wound #3 Left,Lateral Foot Performed By: Physician [name], DO Post Procedure Diagnosis Same as Pre-procedure Electronic Signature(s) Signed: 08/24/2020 4:59:14 PM By: [name] DO Signed: 08/24/2020 5:27:53 PM By: [name] RN, BSN Entered By: [name] on 08/24/2020 16:49:02 -------------------------------------------------------------------------------- SuperBill Details Patient Name: Date of Service: [name], [name] 08/24/2020 Medical Record Number: [id number] Patient Account Number: [id number] Date of Birth/Sex: Treating RN: January 09, 1986 (35 y.o. 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 Facility Procedures CPT4 Code: [id number] Description: 29798 - DEB SUBQ TISSUE 20 SQ CM/< ICD-10 Diagnosis Description E11.621 Type 2 diabetes mellitus with foot ulcer L97.528 Non-pressure chronic ulcer of other part of left foot with other specified seve L97.518 Non-pressure chronic ulcer of  other part of right foot with other specified sev L97.318 Non-pressure chronic ulcer of right ankle with other  specified severity Modifier: rity erity Quantity: 1 Physician Procedures : CPT4 Code Description Modifier [id number] - WC PHYS SUBQ TISS 20 SQ CM ICD-10 Diagnosis Description E11.621 Type 2 diabetes mellitus with foot ulcer L97.528 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ity L97.518  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

## 2020-08-25 NOTE — Progress Notes (Signed)
[name] ([id number]) Visit Report for 08/24/2020 Arrival Information Details Patient Name: Date of Service: [name] 08/24/2020 3:15 PM Medical Record Number: [id number] Patient Account Number: [id number] Date of Birth/Sex: Treating RN: 02/22/1986 (35 y.o. [name] Primary Care [name]: [name] Other Clinician: Referring [name]: Treating [name]/Extender: [name] in Treatment: 69 Visit Information History Since Last Visit Added or deleted any medications: No Patient Arrived: Ambulatory Any new allergies or adverse reactions: No Arrival Time: 15:54 Had a fall or experienced change in No Transfer Assistance: None activities of daily living that may affect Patient Identification Verified: Yes risk of falls: Secondary Verification Process Completed: Yes Signs or symptoms of abuse/neglect since last visito No Patient Requires Transmission-Based Precautions: No Hospitalized since last visit: No Patient Has Alerts: No Implantable device outside of the clinic excluding No cellular tissue based products placed in the center since last visit: Has Dressing in Place as Prescribed: Yes Has Footwear/Offloading in Place as Prescribed: Yes Left: T Contact Cast otal Right: Wedge Shoe Pain Present Now: No Electronic Signature(s) Signed: 08/24/2020 6:04:09 PM By: [name] Entered By: [name] on 08/24/2020 15:55:36 -------------------------------------------------------------------------------- Encounter Discharge Information Details Patient Name: Date of Service: [name], [name] 08/24/2020 3:15 PM Medical Record Number: [id number] Patient Account Number: [id number] Date of Birth/Sex: Treating RN: 18-Jan-1986 (35 y.o. [name] Primary Care Harlan Ervine: [name] Other Clinician: Referring [name]: Treating [name]/Extender: [name] in Treatment: 34 Encounter Discharge  Information Items Post Procedure Vitals Discharge Condition: Stable Temperature (F): 98.4 Ambulatory Status: Ambulatory Pulse (bpm): 84 Discharge Destination: Home Respiratory Rate (breaths/min): 16 Transportation: Private Auto Blood Pressure (mmHg): 125/83 Accompanied By: self Schedule Follow-up Appointment: Yes Clinical Summary of Care: Electronic Signature(s) Signed: 08/24/2020 6:20:16 PM By: [name] Entered By: [name] on 08/24/2020 18:20:01 -------------------------------------------------------------------------------- Lower Extremity Assessment Details Patient Name: Date of Service: [name] 08/24/2020 3:15 PM Medical Record Number: [id number] Patient Account Number: [id number] Date of Birth/Sex: Treating RN: 03/05/1986 (35 y.o. [name], [name] Primary Care Mikka Kissner: [name] Other Clinician: Referring Jennfer Gassen: Treating Daci Stubbe/Extender: [name] in Treatment: 20 [name]: No] [name]: No] Max: [Left: Yes] [Right: Yes] Calf Left: Right: Point of Measurement: 48 cm From Medial Instep 51 cm 47 cm Ankle Left: Right: Point of Measurement: 10 cm From Medial Instep 33 cm 34 cm Vascular Assessment Pulses: Dorsalis Pedis Palpable: [Left:Yes] [Right:Yes] Posterior Tibial Palpable: [Left:Yes] [Right:Yes] Electronic Signature(s) Signed: 08/24/2020 5:30:27 PM By: Max Hammock RN Entered By: [name] on 08/24/2020 16:05:09 -------------------------------------------------------------------------------- Multi Wound Chart Details Patient Name: Date of Service: [name], [name] 08/24/2020 3:15 PM Medical Record Number: [id number] Patient Account Number: [id number] Date of Birth/Sex: Treating RN: 1986-01-09 (35 y.o. M) Primary Care Maxtyn Nuzum: [name] Other Clinician: Referring Jayliah Benett: Treating [name]/Extender: [name] in  Treatment: 1 Vital Signs Height(in): 77 Capillary Blood Glucose(mg/dl): 124 Weight(lbs): 280 Pulse(bpm): 89 Body Mass Index(BMI): 33 Blood Pressure(mmHg): 125/83 Temperature(F): 98.4 Respiratory Rate(breaths/min): 16 Photos: [3:Left, Lateral Foot] [5:Right, Lateral Metatarsal head fifth Right, Anterior Ankle] Wound Location: [3:Trauma] [5:Gradually Appeared] [7:Gradually Appeared] Wounding Event: [3:Diabetic Wound/Ulcer of the Lower] [5:Diabetic Wound/Ulcer of the Lower] [7:Diabetic Wound/Ulcer of the Lower] Primary Etiology: [3:Extremity Type II Diabetes] [5:Extremity Type II Diabetes] [7:Extremity Type II Diabetes] Comorbid History: [3:10/02/2019] [5:07/07/2020] [7:08/17/2020] Date Acquired: [3:43] [5:5] [7:1] Weeks of Treatment: [3:Open] [5:Open] [7:Open] Wound Status: [3:2.4x2.2x0.5] [5:3x3x0.2] [7:2x2.4x0.1] Measurements L x W x  [name] [3:4.147] [5:7.069] [7:3.77] A (cm) : rea [3:2.073] [5:1.414] [7:0.377] Volume (cm) : [3:-151.50%] [5:19.60%] [7:-66.10%] % Reduction in A [3:rea: -1156.40%] [5:67.80%] [7:-66.10%] % Reduction in Volume: [3:Grade 2] [5:Grade 2] [7:Grade 2] Classification: [3:Medium] [5:Medium] [7:Medium] Exudate A mount: [3:Serosanguineous] [5:Serosanguineous] [7:Serosanguineous] Exudate Type: [3:red, brown] [5:red, brown] [7:red, brown] Exudate Color: [3:Well defined, not attached] [5:Well defined, not attached] [7:Flat and Intact] Wound Margin: [3:Large (67-100%)] [5:Large (67-100%)] [7:Large (67-100%)] Granulation A mount: [3:Red] [5:Red, Pink, Hyper-granulation] [7:Pink] Granulation Quality: [3:None Present (0%)] [5:Small (1-33%)] [7:Small (1-33%)] Necrotic A mount: [3:Fat Layer (Subcutaneous Tissue): Yes Fat Layer (Subcutaneous Tissue): Yes Fat Layer (Subcutaneous Tissue): Yes] Exposed Structures: [3:Fascia: No Tendon: No Muscle: No Joint: No Bone: No Small (1-33%)] [5:Fascia: No Tendon: No Muscle: No Joint: No Bone: No Small (1-33%)] [7:Fascia: No Tendon:  No Muscle: No Joint: No Bone: No None] Epithelialization: [3:Debridement - Excisional] [5:Debridement - Excisional] [7:Debridement - Excisional] Debridement: Pre-procedure Verification/Time Out 16:43 [5:16:43] [7:16:43] Taken: [3:Callus, Subcutaneous, Slough] [5:Callus, Subcutaneous, Slough] [7:Subcutaneous, Slough] Tissue Debrided: [3:Skin/Subcutaneous Tissue] [5:Skin/Subcutaneous Tissue] [7:Skin/Subcutaneous Tissue] Level: [3:5.28] [5:9] [7:4.8] Debridement A (sq cm): [3:rea Curette] [5:Curette] [7:Curette] Instrument: [3:Minimum] [5:Moderate] [7:Minimum] Bleeding: [3:Pressure] [5:Silver Nitrate] [7:Pressure] Hemostasis A chieved: [3:0] [5:0] [7:0] Procedural Pain: [3:0] [5:0] [7:0] Post Procedural Pain: [3:Procedure was tolerated well] [5:Procedure was tolerated well] [7:Procedure was tolerated well] Debridement Treatment Response: [3:2.4x2.2x0.5] [5:3x3x0.2] [7:2x2.4x0.1] Post Debridement Measurements L x W x [name] [3:2.073] [5:1.414] [7:0.377] Post Debridement Volume: (cm) [3:Debridement] [5:Debridement] [7:Debridement] Procedures Performed: [3:T Contact Cast otal] Treatment Notes Electronic Signature(s) Signed: 08/24/2020 4:59:14 PM By: [name] DO Entered By: [name] on 08/24/2020 16:52:56 -------------------------------------------------------------------------------- [place] Details Patient Name: Date of Service: [name], [name] 08/24/2020 3:15 PM Medical Record Number: [id number] Patient Account Number: [id number] Date of Birth/Sex: Treating RN: Oct 23, 1985 (35 y.o. [name] Primary Care Gwendalyn Mcgonagle: [name] Other Clinician: Referring Darris Staiger: Treating Brodey Bonn/Extender: [name] in Treatment: 67 Multidisciplinary Care Plan reviewed with physician Active Inactive Nutrition Nursing Diagnoses: Imbalanced nutrition Potential for alteratiion in Nutrition/Potential for imbalanced  nutrition Goals: Patient/caregiver agrees to and verbalizes understanding of need to use nutritional supplements and/or vitamins as prescribed Date Initiated: 10/24/2019 Date Inactivated: 04/06/2020 Target Resolution Date: 04/03/2020 Goal Status: Met Patient/caregiver will maintain therapeutic glucose control Date Initiated: 10/24/2019 Target Resolution Date: 09/18/2020 Goal Status: Active Interventions: Assess HgA1c results as ordered upon admission and as needed Assess patient nutrition upon admission and as needed per policy Provide education on elevated blood sugars and impact on wound healing Provide education on nutrition Treatment Activities: Education provided on Nutrition : 07/21/2020 Notes: Wound/Skin Impairment Nursing Diagnoses: Impaired tissue integrity Knowledge deficit related to ulceration/compromised skin integrity Goals: Patient/caregiver will verbalize understanding of skin care regimen Date Initiated: 10/24/2019 Target Resolution Date: 09/18/2020 Goal Status: Active Ulcer/skin breakdown will have a volume reduction of 30% by week 4 Date Initiated: 10/24/2019 Date Inactivated: 01/16/2020 Target Resolution Date: 01/10/2020 Unmet Reason: no change in Goal Status: Unmet measurements. Interventions: Assess patient/caregiver ability to obtain necessary supplies Assess patient/caregiver ability to perform ulcer/skin care regimen upon admission and as needed Assess ulceration(s) every visit Provide education on ulcer and skin care Notes: Electronic Signature(s) Signed: 08/24/2020 5:27:53 PM By: [name] RN, BSN Entered By: [name] on 08/24/2020 16:49:16 -------------------------------------------------------------------------------- Pain Assessment Details Patient Name: Date of Service: [name], [name] 08/24/2020 3:15 PM Medical Record Number: [id number] Patient Account Number: [id number] Date of Birth/Sex: Treating RN: 12/10/1985 (35 y.o. [name]  Primary Care Elica Almas: [name] Other Clinician: Referring Luverta Korte: Treating Jaden Batchelder/Extender: [name] in Treatment: 20 Active Problems Location of Pain Severity and Description of Pain Patient Has Paino No Site Locations Pain Management and Medication Current Pain Management: Electronic Signature(s) Signed: 08/24/2020 6:04:09 PM By: [name] Entered By: [name] on 08/24/2020 15:56:33 -------------------------------------------------------------------------------- Wound Assessment Details Patient Name: Date of Service: [name] 08/24/2020 3:15 PM Medical Record Number: [id number] Patient Account Number: [id number] Date of Birth/Sex: Treating RN: 1985-04-21 (35 y.o. [name], [name] Primary Care Tanya Crothers: [name] Other Clinician: Referring Jasmeet Manton: Treating Latonyia Lopata/Extender: [name] in Treatment: 22 Wound Status Wound Number: 3 Primary Etiology: Diabetic Wound/Ulcer of the Lower Extremity Wound Location: Left, Lateral Foot Wound Status: Open Wounding Event: Trauma Comorbid History: Type II Diabetes Date Acquired: 10/02/2019 Weeks Of Treatment: 43 Clustered Wound: No Photos Wound Measurements Length: (cm) 2.4 Width: (cm) 2.2 Depth: (cm) 0.5 Area: (cm) 4.147 Volume: (cm) 2.073 % Reduction in Area: -151.5% % Reduction in Volume: -1156.4% Epithelialization: Small (1-33%) Tunneling: No Undermining: No Wound Description Classification: Grade 2 Wound Margin: Well defined, not attached Exudate Amount: Medium Exudate Type: Serosanguineous Exudate Color: red, brown Foul Odor After Cleansing: No Slough/Fibrino No Wound Bed Granulation Amount: Large (67-100%) Exposed Structure Granulation Quality: Red Fascia Exposed: No Necrotic Amount: None Present (0%) Fat Layer (Subcutaneous Tissue) Exposed: Yes Tendon Exposed: No Muscle Exposed: No Joint Exposed:  No Bone Exposed: No Treatment Notes Wound #3 (Foot) Wound Laterality: Left, Lateral Cleanser Soap and Water Discharge Instruction: May shower and wash wound with dial antibacterial soap and water prior to dressing change. Peri-Wound Care Zinc Oxide Ointment 30g tube Discharge Instruction: Apply Zinc Oxide to periwound with each dressing change as needed. Topical Primary Dressing KerraCel Ag Gelling Fiber Dressing, 2x2 in (silver alginate) Discharge Instruction: Apply silver alginate to wound bed as instructed Secondary Dressing Woven Gauze Sponge, Non-Sterile 4x4 in Discharge Instruction: Apply over primary dressing as directed. Zetuvit Plus 4x8 in Discharge Instruction: Apply over primary dressing as directed. ABD Pad, 8x10 Discharge Instruction: T down before applying kerlix. Apply over primary dressing as directed. ape Secured With [employer], 4.5x3.1 (in/yd) Discharge Instruction: Secure with Kerlix as directed. Compression Wrap Compression Stockings Add-Ons Notes TCC to left leg last layer applied by MD. Electronic Signature(s) Signed: 08/24/2020 5:06:59 PM By: Sandre Kitty Signed: 08/24/2020 5:30:27 PM By: Max Hammock RN Entered By: Sandre Kitty on 08/24/2020 16:44:05 -------------------------------------------------------------------------------- Wound Assessment Details Patient Name: Date of Service: [name], [name] 08/24/2020 3:15 PM Medical Record Number: [id number] Patient Account Number: [id number] Date of Birth/Sex: Treating RN: 04-18-1985 (35 y.o. [name], [name] Primary Care Keondrick Dilks: [name] Other Clinician: Referring Ivis Henneman: Treating Dovber Ernest/Extender: [name] in Treatment: 93 Wound Status Wound Number: 5 Primary Etiology: Diabetic Wound/Ulcer of the Lower Extremity Wound Location: Right, Lateral Metatarsal head fifth Wound Status: Open Wounding Event: Gradually Appeared Comorbid  History: Type II Diabetes Date Acquired: 07/07/2020 Weeks Of Treatment: 5 Clustered Wound: No Photos Wound Measurements Length: (cm) 3 Width: (cm) 3 Depth: (cm) 0.2 Area: (cm) 7.069 Volume: (cm) 1.414 % Reduction in Area: 19.6% % Reduction in Volume: 67.8% Epithelialization: Small (1-33%) Wound Description Classification: Grade 2 Wound Margin: Well defined, not attached Exudate Amount: Medium Exudate Type: Serosanguineous Exudate Color: red, brown Foul Odor After Cleansing: No Slough/Fibrino No Wound Bed Granulation Amount: Large (67-100%) Exposed Structure Granulation Quality: Red, Pink, Hyper-granulation Fascia Exposed: No Necrotic Amount: Small (1-33%)  Fat Layer (Subcutaneous Tissue) Exposed: Yes Necrotic Quality: Adherent Slough Tendon Exposed: No Muscle Exposed: No Joint Exposed: No Bone Exposed: No Treatment Notes Wound #5 (Metatarsal head fifth) Wound Laterality: Right, Lateral Cleanser Normal Saline Discharge Instruction: Cleanse the wound with Normal Saline prior to applying a clean dressing using gauze sponges, not tissue or cotton balls. Soap and Water Discharge Instruction: May shower and wash wound with dial antibacterial soap and water prior to dressing change. Peri-Wound Care Topical Primary Dressing KerraCel Ag Gelling Fiber Dressing, 2x2 in (silver alginate) Discharge Instruction: Apply silver alginate to wound bed as instructed Secondary Dressing ABD Pad, 8x10 Discharge Instruction: Apply over primary dressing as directed. Optifoam Non-Adhesive Dressing, 4x4 in Discharge Instruction: Apply over primary dressing, cut to make foam donut to offload Secured With [employer] Sterile, 4.5x3.1 (in/yd) Discharge Instruction: Secure with Kerlix as directed. 9M Medipore H Soft Cloth Surgical T ape, 2x2 (in/yd) Discharge Instruction: Secure dressing with tape as directed. Compression Wrap Compression Stockings Add-Ons Notes TCC to left leg last layer  applied by MD. Electronic Signature(s) Signed: 08/24/2020 5:06:59 PM By: Sandre Kitty Signed: 08/24/2020 5:30:27 PM By: Max Hammock RN Entered By: Sandre Kitty on 08/24/2020 16:45:00 -------------------------------------------------------------------------------- Wound Assessment Details Patient Name: Date of Service: [name], [name] 08/24/2020 3:15 PM Medical Record Number: [id number] Patient Account Number: [id number] Date of Birth/Sex: Treating RN: 06-Nov-1985 (35 y.o. [name], [name] Primary Care Ming Kunka: [name] Other Clinician: Referring Caron Ode: Treating Dennise Raabe/Extender: [name] in Treatment: 57 Wound Status Wound Number: 7 Primary Etiology: Diabetic Wound/Ulcer of the Lower Extremity Wound Location: Right, Anterior Ankle Wound Status: Open Wounding Event: Gradually Appeared Comorbid History: Type II Diabetes Date Acquired: 08/17/2020 Weeks Of Treatment: 1 Clustered Wound: No Photos Wound Measurements Length: (cm) 2 Width: (cm) 2.4 Depth: (cm) 0.1 Area: (cm) 3.77 Volume: (cm) 0.377 % Reduction in Area: -66.1% % Reduction in Volume: -66.1% Epithelialization: None Wound Description Classification: Grade 2 Wound Margin: Flat and Intact Exudate Amount: Medium Exudate Type: Serosanguineous Exudate Color: red, brown Foul Odor After Cleansing: No Slough/Fibrino Yes Wound Bed Granulation Amount: Large (67-100%) Exposed Structure Granulation Quality: Pink Fascia Exposed: No Necrotic Amount: Small (1-33%) Fat Layer (Subcutaneous Tissue) Exposed: Yes Necrotic Quality: Adherent Slough Tendon Exposed: No Muscle Exposed: No Joint Exposed: No Bone Exposed: No Treatment Notes Wound #7 (Ankle) Wound Laterality: Right, Anterior Cleanser Normal Saline Discharge Instruction: Cleanse the wound with Normal Saline prior to applying a clean dressing using gauze sponges, not tissue or cotton balls. Soap and  Water Discharge Instruction: May shower and wash wound with dial antibacterial soap and water prior to dressing change. Peri-Wound Care Topical Primary Dressing KerraCel Ag Gelling Fiber Dressing, 2x2 in (silver alginate) Discharge Instruction: Apply silver alginate to wound bed as instructed Secondary Dressing Woven Gauze Sponge, Non-Sterile 4x4 in Discharge Instruction: Apply over primary dressing as directed. ABD Pad, 8x10 Discharge Instruction: Apply over primary dressing as directed. Secured With [employer], 4.5x3.1 (in/yd) Discharge Instruction: Secure with Kerlix as directed. 9M Medipore H Soft Cloth Surgical T ape, 2x2 (in/yd) Discharge Instruction: Secure dressing with tape as directed. Compression Wrap Compression Stockings Add-Ons Notes TCC to left leg last layer applied by MD. Electronic Signature(s) Signed: 08/24/2020 5:06:59 PM By: Sandre Kitty Signed: 08/24/2020 5:30:27 PM By: Max Hammock RN Entered By: Sandre Kitty on 08/24/2020 16:44:34 -------------------------------------------------------------------------------- Vitals Details Patient Name: Date of Service: [name], [name] 08/24/2020 3:15 PM Medical Record Number: [id number] Patient Account Number: [id number] Date of  Birth/Sex: Treating RN: Nov 29, 1985 (35 y.o. [name] Primary Care Madelynn Malson: [name] Other Clinician: Referring Fadi Menter: Treating Kebrina Friend/Extender: [name] in Treatment: 57 Vital Signs Time Taken: 15:55 Temperature (F): 98.4 Height (in): 77 Pulse (bpm): 89 Weight (lbs): 280 Respiratory Rate (breaths/min): 16 Body Mass Index (BMI): 33.2 Blood Pressure (mmHg): 125/83 Capillary Blood Glucose (mg/dl): 124 Reference Range: 80 - 120 mg / dl Notes Glucose per patient report. Electronic Signature(s) Signed: 08/24/2020 6:04:09 PM By: [name] Entered By: [name] on 08/24/2020 15:56:24

## 2020-09-01 ENCOUNTER — Encounter (HOSPITAL_BASED_OUTPATIENT_CLINIC_OR_DEPARTMENT_OTHER): Payer: BC Managed Care – PPO | Admitting: Internal Medicine

## 2020-09-01 ENCOUNTER — Other Ambulatory Visit: Payer: Self-pay

## 2020-09-01 DIAGNOSIS — E11621 Type 2 diabetes mellitus with foot ulcer: Secondary | ICD-10-CM | POA: Diagnosis not present

## 2020-09-01 NOTE — Progress Notes (Signed)
[place], [name] ([id number]) Visit Report for 09/01/2020 HPI Details Patient Name: Date of Service: [name] 09/01/2020 8:00 A M Medical Record Number: [id number] Patient Account Number: [id number] Date of Birth/Sex: Treating RN: Aug 23, 1985 (35 y.o. 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 He comes in for review of a wound over the plantar fifth metatarsal head extending into the lateral part of the foot. He was followed for this previously by his podiatrist Dr. Cornelius Moras. As the patient tells his story he went to see podiatry first for a swelling he developed on the lateral part of his fifth metatarsal head in May. He states this was "open" by podiatry and the area closed. He was followed up in June and it was again opened callus removed and it closed promptly. There were plans being made for surgery on the fifth metatarsal head in June however his blood sugar was apparently too high for anesthesia. Apparently the area was debrided and opened again in June and it is never closed since. Looking over the records from podiatry I am really not able to follow this. It was clear when he was first seen it was before 5/14 at that point he already had a wound. By 5/17 the ulcer was resolved. I do not see anything about a procedure. On 5/28 noted to have pre-ulcerative moderate keratosis. X-ray noted 1/5 contracted toe and tailor's bunion and metatarsal deformity. On a visit date on 09/28/2018 the dorsal part of the left foot it healed and resolved. There was concern about swelling in his lower extremity he was sent to the ER.. As far as I can tell he was seen in the ER on 7/12 with an ulcer on his left foot. A DVT rule  out of the left leg was negative. I do not think I have complete records from podiatry but I am not able to verify the procedures this patient states he had. He states after the last procedure the wound has never closed although I am not able to follow this in the records I have from podiatry. He has not had a recent x-ray The patient has been using Neosporin on the wound. He is wearing a Darco shoe. He is still very active up on his foot working and exercising. Past medical history; type 2 diabetes ketosis-prone, leg swelling with a negative DVT study in July. Non-smoker ABI in our clinic was 0.85 on the left 10/16; substantial wound on the plantar left fifth met head extending laterally almost to the dorsal fifth MTP. We have been using silver alginate we gave him a Darco forefoot off loader. An x-ray did not show evidence of osteomyelitis did note soft tissue emphysema which I think was due to gas tracking through an open wound. There is no doubt in my mind he requires an MRI 10/23; MRI not booked until 3 November at the earliest this is largely due to his glucose sensor in the right arm. We have been using silver alginate. There has been an improvement 10/29; I am still not exactly sure when his MRI is booked for. He says it is the third but it is the 10th in epic. This definitely needs to be done. He is running a low-grade fever today but no other symptoms. No real  improvement in the 1 02/26/2019 patient presents today for a follow-up visit here in our clinic he is last been seen in the clinic on October 29. Subsequently we were working on getting MRI to evaluate and see what exactly was going on and where we would need to go from the standpoint of whether or not he had osteomyelitis and again what treatments were going be required. Subsequently the patient ended up being admitted to the hospital on 02/07/2019 and was discharged on 02/14/2019. This is a somewhat interesting admission with a  discharge diagnosis of pneumonia due to COVID-19 although he was positive for COVID-19 when tested at the urgent care but negative x2 when he was actually in the hospital. With that being said he did have acute respiratory failure with hypoxia and it was noted he also have a left foot ulceration with osteomyelitis. With that being said he did require oxygen for his pneumonia and I level 4 L. He was placed on antivirals and steroids for the COVID-19. He was also transferred to the [facility] at one point. Nonetheless he did subsequently discharged home and since being home has done much better in that regard. The CT angiogram did not show any pulmonary embolism. With regard to the osteomyelitis the patient was placed on vancomycin and Zosyn while in the hospital but has been changed to Augmentin at discharge. It was also recommended that he follow- up with wound care and podiatry. Podiatry however wanted him to see [name] according to the patient prior to them doing anything further. His hemoglobin A1c was 9.9 as noted in the hospital. Have an MRI of the left foot performed while in the hospital on 02/04/2019. 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 There is an overlying plantar open wound noted an abscess tracking back along the lateral aspect of the fifth metatarsal shaft. There is otherwise diffuse cellulitis and mild fasciitis without findings of polymyositis. The patient did have recently pneumonia secondary to COVID-19 I looked in the chart through epic and it does appear that the patient may need to have an additional x-ray just to ensure everything is cleared and that he has no airspace disease prior to putting him into the chamber. 03/05/2019; patient was readmitted to the clinic last week. He was hospitalized twice for a viral upper respiratory tract infection from 11/1 through 11/4 and  then 11/5 through 11/12 ultimately this turned out to be Covid pneumonitis. Although he was discharged on oxygen he is not using it. He says he feels fine. He has no exercise limitation no cough no sputum. His O2 sat in our clinic today was 100% on room air. 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 He received Vanco and Zosyn in the hospital and then was discharged on 2 weeks of Augmentin. I do not see any relevant cultures. He was supposed to follow-up with infectious disease but I do not see that he has an appointment. 12/8; patient saw Dr. Novella Olive of infectious disease last week. He felt that he had had adequate antibiotic therapy. He did not go to follow-up with Dr. Amalia Hailey of podiatry and I have again talked to him about the pros and cons of this. He does not want to consider a ray amputation of this time. He is aware of the risks  of recurrence, migration etc. He started HBO today and tolerated this well. He can complete the Augmentin that I gave him last week. I have looked over the lab work that Dr. Chana Bode ordered his C-reactive protein was 3.3 and his sedimentation rate was 17. The C-reactive protein is never really been measurably that high in this patient 12/15; not much change in the wound today however he has undermining along the lateral part of the foot again more extensively than last week. He has some rims of epithelialization. We have been using silver alginate. He is undergoing hyperbarics but did not dive today 12/18; in for his obligatory first total contact cast change. Unfortunately there was pus coming from the undermining area around his fifth metatarsal head. This was cultured but will preclude reapplication of a cast. He is seen in conjunction with HBO 12/24; patient had staph lugdunensis in the wound in the undermining area laterally last time. We put him on  doxycycline which should have covered this. The wound looks better today. I am going to give him another week of doxycycline before reattempting the total contact cast 12/31; the patient is completing antibiotics. Hemorrhagic debris in the distal part of the wound with some undermining distally. He also had hyper granulation. Extensive debridement with a #5 curette. The infected area that was on the lateral part of the fifth met head is closed over. I do not think he needs any more antibiotics. Patient was seen prior to HBO. Preparations for a total contact cast were made in the cast will be placed post hyperbarics 04/11/19; once again the patient arrives today without complaint. He had been in a cast all week noted that he had heavy drainage this week. This resulted in large raised areas of macerated tissue around the wound 1/14; wound bed looks better slightly smaller. Hydrofera Blue has been changing himself. He had a heavy drainage last week which caused a lot of maceration around the wound so I took him out of a total contact cast he says the drainage is actually better this week He is seen today in conjunction with HBO 1/21; returns to clinic. He was up in [place] for a day or 2 attending a funeral. He comes back in with the wound larger and with a large area of exposed bone. He had osteomyelitis and septic arthritis of the fifth left metatarsal head while he was in hospital. He received IV antibiotics in the hospital for a prolonged period of time then 3 weeks of Augmentin. Subsequently I gave him 2 weeks of doxycycline for more superficial wound infection. When I saw this last week the wound was smaller the surface of the wound looks satisfactory. 1/28; patient missed hyperbarics today. Bone biopsy I did last time showed Enterococcus faecalis and Staphylococcus lugdunensis . He has a wide area of exposed bone. We are going to use silver alginate as of today. I had another ethical discussion  with the patient. 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 In this situation I think the likelihood of healing this is low. Therefore I have recommended a ray amputation and with the patient's agreement I have referred him to Dr. Doran Durand. The other issue is that his compliance with hyperbarics has been minimal because of his work schedule and given his underlying decision I am going to stop this today READMISSION 10/24/2019 MRI 09/29/2019 left foot IMPRESSION: 1. Apparent skin ulceration inferior and lateral to the 5th metatarsal base with underlying heterogeneous T2  signal and enhancement in the subcutaneous fat. Small peripherally enhancing fluid collections along the plantar and lateral aspects of the 5th metatarsal base suspicious for abscesses. 2. Interval amputation through the mid 5th metatarsal with nonspecific low-level marrow edema and enhancement. Given the proximity to the adjacent soft tissue inflammatory changes, osteomyelitis cannot be excluded. 3. The additional bones appear unremarkable. MRI 09/29/2019 right foot IMPRESSION: 1. Soft tissue ulceration lateral to the 5th MTP joint. There is low-level T2 hyperintensity within the 4th and 5th metatarsal heads and adjacent proximal phalanges without abnormal T1 signal or cortical destruction. These findings are nonspecific and could be seen with early marrow edema, hyperemia or early osteomyelitis. No evidence of septic joint. 2. Mild tenosynovitis and synovial enhancement associated with the extensor digitorum tendons at the level of the midfoot. 3. Diffuse low-level muscular T2 hyperintensity and enhancement, most consistent with diabetic myopathy. LEFT FOOT BONE Methicillin resistant staphylococcus aureus Staphylococcus lugdunensis MIC MIC CIPROFLOXACIN >=8 RESISTANT Resistant <=0.5 SENSI... Sensitive CLINDAMYCIN <=0.25 SENS... Sensitive >=8 RESISTANT Resistant ERYTHROMYCIN >=8  RESISTANT Resistant >=8 RESISTANT Resistant GENTAMICIN <=0.5 SENSI... Sensitive <=0.5 SENSI... Sensitive Inducible Clindamycin NEGATIVE Sensitive NEGATIVE Sensitive OXACILLIN >=4 RESISTANT Resistant 2 SENSITIVE Sensitive RIFAMPIN <=0.5 SENSI... Sensitive <=0.5 SENSI... Sensitive TETRACYCLINE <=1 SENSITIVE Sensitive <=1 SENSITIVE Sensitive TRIMETH/SULFA <=10 SENSIT Sensitive <=10 SENSIT Sensitive ... Marland Kitchen.. VANCOMYCIN 1 SENSITIVE Sensitive <=0.5 SENSI... Sensitive Right foot bone . Component 3 wk ago Specimen Description BONE Special Requests RIGHT 4 METATARSAL SAMPLE B Gram Stain NO WBC SEEN NO ORGANISMS SEEN Culture RARE METHICILLIN RESISTANT STAPHYLOCOCCUS AUREUS NO ANAEROBES ISOLATED Performed at [facility], [place] [address], [place], [place] [address] Report Status 10/08/2019 FINAL Organism ID, Bacteria METHICILLIN RESISTANT STAPHYLOCOCCUS AUREUS Resulting Agency [facility] Susceptibility Methicillin resistant staphylococcus aureus MIC CIPROFLOXACIN >=8 RESISTANT Resistant CLINDAMYCIN <=0.25 SENS... Sensitive ERYTHROMYCIN >=8 RESISTANT Resistant GENTAMICIN <=0.5 SENSI... Sensitive Inducible Clindamycin NEGATIVE Sensitive OXACILLIN >=4 RESISTANT Resistant RIFAMPIN <=0.5 SENSI... Sensitive TETRACYCLINE <=1 SENSITIVE Sensitive TRIMETH/SULFA <=10 SENSIT Sensitive ... VANCOMYCIN 1 SENSITIVE Sensitive This is a patient we had in clinic earlier this year with a wound over his left fifth metatarsal head. 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 Doran Durand on 05/16/2019. He seems to have developed problems with wounds on his bilateral feet in June 2021 although he may have had problems earlier than  this. He was in an urgent care with a right foot ulcer on 09/26/2019 and given a course of doxycycline. This was apparently after having trouble getting into see orthopedics. He was seen by podiatry on 09/28/2019 noted to have bilateral lower extremity ulcers including the left lateral fifth metatarsal base and the right subfifth met head. It was noted that had purulent drainage at that time. He required hospitalization from 6/20 through 7/2. This was because of worsening right foot wounds. He underwent bilateral operative incision and drainage and bone biopsies bilaterally. Culture results are listed above. He has been referred back to clinic by Dr. Jacqualyn Posey of podiatry. He is also followed by Dr. Megan Salon who saw him yesterday. He was discharged from hospital on Zyvox Flagyl and Levaquin and yesterday changed to doxycycline Flagyl and Levaquin. His inflammatory markers on 6/26 showed a sedimentation rate of 129 and a C-reactive protein of 5. This  is improved to 14 and 1.3 respectively. This would indicate improvement. ABIs in our clinic today were 1.23 on the right and 1.20 on the left 11/01/2019 on evaluation today patient appears to be doing fairly well in regard to the wounds on his feet at this point. Fortunately there is no signs of active infection at this time. No fevers, chills, nausea, vomiting, or diarrhea. He currently is seeing infectious disease and still under their care at this point. Subsequently he also has both wounds which she has not been using collagen on as he did not receive that in his packaging he did not call us and let us know that. Apparently that just was missed on the order. Nonetheless we will get that straightened out today. 8/9-Patient returns for bilateral foot wounds, using Prisma with hydrogel moistened dressings, and the wounds appear stable. Patient using surgical shoes, avoiding much pressure or weightbearing as much as possible 8/16; patient has bilateral foot  wounds. 1 on the right lateral foot proximally the other is on the left mid lateral foot. Both required debridement of callus and thick skin around the wounds. We have been using silver collagen 8/27; patient has bilateral lateral foot wounds. The area on the left substantially surrounded by callus and dry skin. This was removed from the wound edge. The underlying wound is small. The area on the right measured somewhat smaller today. 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 Unfortunately I did not update his antibiotics during today's visit. 9/10 I reviewed Dr. Hale Bogus last notes he felt he had completed antibiotics his inflammatory markers were reasonably well controlled. He has a small wound on the lateral left foot and a tiny area on the right which is just above closed. He is using Hydrofera Blue with border foam he has bilateral surgical shoes 9/24; 2 week f/u. doing well. right foot is closed. left foot still undermined. 10/14; right foot remains closed at the fifth met head. The area over the base of the left fifth metatarsal has a small open area but considerable undermining towards the plantar foot. Thick callus skin around this suggests an adequate pressure relief. We have talked about this. He says he is going to go back into his cam boot. I suggested a total contact cast he did not seem enamored with this suggestion 10/26; left foot base of the fifth metatarsal. Same condition as last time. He has skin over the area with an open wound however the skin is not adherent. He went to see Dr. Earleen Newport who did an x-ray and culture of his foot I have not reviewed the x-ray but the patient was not told anything. He is on doxycycline 11/11; since the patient was last here he was in the emergency room on 10/30 he was concerned about swelling in the left foot. They did not do any cultures or x-rays. They changed his antibiotics to  cephalexin. Previous culture showed group B strep. The cephalexin is appropriate as doxycycline has less than predictable coverage. Arrives in clinic today with swelling over this area under the wound. He also has a new wound on the right fifth metatarsal head 11/18; the patient has a difficult wound on the lateral aspect of the left fifth metatarsal head. The wound was almost ballotable last week I opened it slightly expecting to see purulence however there was just bleeding. I cultured this this was negative. X-ray unchanged. We are trying to get an  MRI but I am not sure were going to be able to get this through his insurance. He also has an area on the right lateral fifth metatarsal head this looks healthier 12/3; the patient finally got our MRI. Surprisingly this did not show osteomyelitis. I did show the soft tissue ulceration at the lateral plantar aspect of the fifth metatarsal base with a tiny residual 6 mm abscess overlying the superficial fascia I have tried to culture this area I have not been able to get this to grow anything. Nevertheless the protruding tissue looks aggravated. I suspect we should try to treat the underlying "abscess with broad-spectrum antibiotics. I am going to start him on Levaquin and Flagyl. He has much less edema in his legs and I am going to continue to wrap his legs and see him weekly 12/10. I started Levaquin and Flagyl on him last week. He just picked up the Flagyl apparently there was some delay. The worry is the wound on the left fifth metatarsal base which is substantial and worsening. His foot looks like he inverts at the ankle making this a weightbearing surface. Certainly no improvement in fact I think the measurements of this are somewhat worse. We have been using 12/17; he apparently just got the Levaquin yesterday this is 2 weeks after the fact. He has completed the Flagyl. The area over the left fifth metatarsal base still has protruding granulation  tissue although it does not look quite as bad as it did some weeks ago. He has severe bilateral lymphedema although we have not been treating him for wounds on his legs this is definitely going to require compression. There was so much edema in the left I did not wish to put him in a total contact cast today. I am going to increase his compression from 3-4 layer. The area on the right lateral fifth met head actually look quite good and superficial. 12/23; patient arrived with callus on the right fifth met head and the substantial hyper granulated callused wound on the base of his fifth metatarsal. He says he is completing his Levaquin in 2 days but I do not think that adds up with what I gave him but I will have to double check this. We are using Hydrofera Blue on both areas. My plan is to put the left leg in a cast the week after New Year's 04/06/2020; patient's wounds about the same. Right lateral fifth metatarsal head and left lateral foot over the base of the fifth metatarsal. There is undermining on the left lateral foot which I removed before application of total contact cast continuing with Hydrofera Blue new. Patient tells me he was seen by endocrinology today lab work was done [Dr. Kerr]. Also wondering whether he was referred to cardiology. I went over some lab work from previously does not have chronic renal failure certainly not nephrotic range proteinuria he does have very poorly controlled diabetes but this is not his most updated lab work. Hemoglobin A1c has been over 11 1/10; the patient had a considerable amount of leakage towards mid part of his left foot with macerated skin however the wound surface looks better the area on the right lateral fifth met head is better as well. I am going to change the dressing on the left foot under the total contact cast to silver alginate, continue with Hydrofera Blue on the right. 1/20; patient was in the total contact cast for 10 days. Considerable  amount of drainage although the skin  around the wound does not look too bad on the left foot. The area on the right fifth metatarsal head is closed. Our nursing staff reports large amount of drainage out of the left lateral foot wound 1/25; continues with copious amounts of drainage described by our intake staff. PCR culture I did last week showed E. coli and Enterococcus faecalis and low quantities. Multiple resistance genes documented including extended spectrum beta lactamase, MRSA, MRSE, quinolone, tetracycline. The wound is not quite as good this week as it was 5 days ago but about the same size 2/3; continues with copious amounts of malodorous drainage per our intake nurse. The PCR culture I did 2 weeks ago showed E. coli and low quantities of Enterococcus. There were multiple resistance genes detected. I put Neosporin on him last week although this does not seem to have helped. The wound is slightly deeper today. Offloading continues to be an issue here although with the amount of drainage she has a total contact cast is just not going to work 2/10; moderate amount of drainage. Patient reports he cannot get his stocking on over the dressing. I told him we have to do that the nurse gave him suggestions on how to make this work. The wound is on the bottom and lateral part of his left foot. Is cultured predominantly grew low amounts of Enterococcus, E. coli and anaerobes. There were multiple resistance genes detected including extended spectrum beta lactamase, quinolone, tetracycline. I could not think of an easy oral combination to address this so for now I am going to do topical antibiotics provided by [facility] I think the main agents here are vancomycin and an aminoglycoside. We have to be able to give him access to the wounds to get the topical antibiotic on 2/17; moderate amount of drainage this is unchanged. He has his Keystone topical antibiotic against the deep tissue culture organisms. He  has been using this and changing the dressing daily. Silver alginate on the wound surface. 2/24; using Keystone antibiotic with silver alginate on the top. He had too much drainage for a total contact cast at one point although I think that is improving and I think in the next week or 2 it might be possible to replace a total contact cast I did not do this today. In general the wound surface looks healthy however he continues to have thick rims of skin and subcutaneous tissue around the wide area of the circumference which I debrided 06/04/2020 upon evaluation today patient appears to be doing well in regard to his wound. I do feel like he is showing signs of improvement. There is little bit of callus and dead tissue around the edges of the wound as well as what appears to be a little bit of a sinus tract that is off to the side laterally I would perform debridement to clear that away today. 3/17; left lateral foot. The wound looks about the same as I remember. Not much depth surface looks healthy. No evidence of infection 3/25; left lateral foot. Wound surface looks about the same. Separating epithelium from the circumference. There really is no evidence of infection here however not making progress by my view 3/29; left lateral foot. Surface of the wound again looks reasonably healthy still thick skin and subcutaneous tissue around the wound margins. There is no evidence of infection. One of the concerns being brought up by the nurses has again the amount of drainage vis--vis continued use of a total contact cast 4/5;  left lateral foot at roughly the base of the fifth metatarsal. Nice healthy looking granulated tissue with rims of epithelialization. The overall wound measurements are not any better but the tissue looks healthy. The only concern is the amount of drainage although he has no surrounding maceration with what we have been doing recently to absorb fluid and protect his skin. He also has  lymphedema. 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 He states that the kerlix/coban under the TCC rolled down and could not get it back up. He has been using an offloading scooter and has somehow hurt his right foot using this device. This happened last week. He states that the side of his right foot developed a blister and opened. The top of his foot also has a few small open wounds he thinks is due to his socks rubbing in his shoes. He has not been using any dressings to the wound. He denies purulent drainage, fever/chills or erythema to the wounds. 4/22; patient presents for 1 week follow-up. He developed new wounds to the right foot that were evaluated at last clinic visit. He continues to have a total contact cast to the left leg and he reports no issues. He has been using silver collagen to the right foot wounds with no issues. He denies purulent drainage, fever/chills or erythema to the right foot wounds. He has no complaints today 4/25; patient presents for 1 week follow-up. He has a total contact cast of the left leg and reports no issues. He has been using silver alginate to the right foot wound. He denies purulent drainage, fever/chills or erythema to the right foot wounds. 5/2 patient presents for 1 week follow-up. T contact cast on the left. The wound which is on the base of the plantar foot at the base of the fifth metatarsal otal actually looks quite good and dimensions continue to gradually contract. HOWEVER the area on the right lateral fifth metatarsal head is much larger than what I remember from 2 weeks ago. Once more is he has significant levels of hypergranulation. Noteworthy that he had this same hyper granulated response on his  wound on the left foot at one point in time. So much so that he I thought there was an underlying fluid collection. Based on this I think this just needs debridement. 5/9; the wound on the left actually continues to be gradually smaller with a healthy surface. Slight amount of drainage and maceration of the skin around but not too bad. However he has a large wound over the right fifth metatarsal head very much in the same configuration as his left foot wound was initially. I used silver nitrate to address the hyper granulated tissue no mechanical debridement 5/16; area on the left foot did not look as healthy this week deeper thick surrounding macerated skin and subcutaneous tissue. The area on the right foot fifth met head was about the same The area on the right ankle that we identified last week is completely broken down into an open wound presumably a stocking rubbing issue 5/23; patient has been using a total contact cast to the left side. He has been using silver alginate underneath. He has also been using silver alginate to the right  foot wounds. He has no complaints today. He denies any signs of infection. 5/31; the left-sided wound looks some better measure smaller surface granulation looks better. We have been using silver alginate under the total contact cast The large area on his right fifth met head and right dorsal foot look about the same still using silver alginate Electronic Signature(s) Signed: 09/01/2020 5:02:17 PM By: [name] MD Entered By: [name] on 09/01/2020 09:26:34 -------------------------------------------------------------------------------- Physical Exam Details Patient Name: Date of Service: [name], [name] 09/01/2020 8:00 A M Medical Record Number: [id number] Patient Account Number: [id number] Date of Birth/Sex: Treating RN: [birth year] (35 y.o. [name] Primary Care Provider: [name] Other Clinician: Referring Provider: Treating  Provider/Extender: [name] in Treatment: 44 Constitutional Sitting or standing Blood Pressure is within target range for patient.. Pulse regular and within target range for patient.Marland Kitchen Respirations regular, non-labored and within target range.. Temperature is normal and within the target range for the patient.Marland Kitchen Appears in no distress. Notes Wound exam; left foot laterally. Surface looks better. Granulation looks healthier. Some maceration of skin around the wound reflecting the drainage but generally not too bad Right fifth met head large wound no obvious infection no debridement. Dorsal foot also in the same condition Electronic Signature(s) Signed: 09/01/2020 5:02:17 PM By: [name] MD Entered By: [name] on 09/01/2020 09:28:52 -------------------------------------------------------------------------------- Physician Orders Details Patient Name: Date of Service: [name], [name] 09/01/2020 8:00 A M Medical Record Number: [id number] Patient Account Number: [id number] Date of Birth/Sex: Treating RN: 1986-03-27 (35 y.o. 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 - ***60 minutes for cast*** - with Dr. Dellia Nims Return A Bathing/ Shower/ Hygiene May shower with protection but do not get wound dressing(s) wet. - use a cast protector to left leg. Edema Control - Lymphedema / SCD /  Other Bilateral Lower Extremities Elevate legs to the level of the heart or above for 30 minutes daily and/or when sitting, a frequency of: - throughout the day Avoid standing for long periods of time. Exercise regularly Moisturize legs daily. - right leg every night before bed. Compression stocking or Garment 20-30 mm/Hg pressure to: - Apply to right leg in the morning and remove at night. Off-Loading Total Contact Cast to Left Lower Extremity Open toe surgical shoe to: - right foot Wound Treatment Wound #3 - Foot Wound Laterality: Left, Lateral Cleanser: Soap and Water 1 x Per Week/30 Days Discharge Instructions: May shower and wash wound with dial antibacterial soap and water prior to dressing change. Peri-Wound Care: Zinc Oxide Ointment 30g tube 1 x Per Week/30 Days Discharge Instructions: Apply Zinc Oxide to periwound with each dressing change as needed. Prim Dressing: KerraCel Ag Gelling Fiber Dressing, 2x2 in (silver alginate) 1 x Per Week/30 Days ary Discharge Instructions: Apply silver alginate to wound bed as instructed Secondary Dressing: Woven Gauze Sponge, Non-Sterile 4x4 in 1 x Per Week/30 Days Discharge Instructions: Apply over primary dressing as directed. Secondary Dressing: ABD Pad, 8x10 1 x Per Week/30 Days Discharge  Instructions: T down before applying kerlix. Apply over primary dressing as directed. ape Secondary Dressing: Zetuvit Plus 4x8 in 1 x Per Week/30 Days Discharge Instructions: Apply over primary dressing as directed. Secured With: [employer], 4.5x3.1 (in/yd) 1 x Per Week/30 Days Discharge Instructions: Secure with Kerlix as directed. Wound #5 - Metatarsal head fifth Wound Laterality: Right, Lateral Cleanser: Normal Saline (Generic) Every Other Day/15 Days Discharge Instructions: Cleanse the wound with Normal Saline prior to applying a clean dressing using gauze sponges, not tissue or cotton balls. Cleanser: Soap and Water Every Other Day/15  Days Discharge Instructions: May shower and wash wound with dial antibacterial soap and water prior to dressing change. Prim Dressing: KerraCel Ag Gelling Fiber Dressing, 2x2 in (silver alginate) (Generic) Every Other Day/15 Days ary Discharge Instructions: Apply silver alginate to wound bed as instructed Secondary Dressing: ABD Pad, 8x10 (Generic) Every Other Day/15 Days Discharge Instructions: Apply over primary dressing as directed. Secondary Dressing: Optifoam Non-Adhesive Dressing, 4x4 in (Generic) Every Other Day/15 Days Discharge Instructions: Apply over primary dressing, cut to make foam donut to offload Secured With: Kerlix Roll Sterile, 4.5x3.1 (in/yd) (Generic) Every Other Day/15 Days Discharge Instructions: Secure with Kerlix as directed. Secured With: 76M Medipore H Soft Cloth Surgical Tape, 2x2 (in/yd) (Generic) Every Other Day/15 Days Discharge Instructions: Secure dressing with tape as directed. Wound #7 - Ankle Wound Laterality: Right, Anterior Cleanser: Normal Saline Every Other Day/15 Days Discharge Instructions: Cleanse the wound with Normal Saline prior to applying a clean dressing using gauze sponges, not tissue or cotton balls. Cleanser: Soap and Water Every Other Day/15 Days Discharge Instructions: May shower and wash wound with dial antibacterial soap and water prior to dressing change. Prim Dressing: KerraCel Ag Gelling Fiber Dressing, 2x2 in (silver alginate) Every Other Day/15 Days ary Discharge Instructions: Apply silver alginate to wound bed as instructed Secondary Dressing: Woven Gauze Sponge, Non-Sterile 4x4 in Every Other Day/15 Days Discharge Instructions: Apply over primary dressing as directed. Secondary Dressing: ABD Pad, 8x10 Every Other Day/15 Days Discharge Instructions: Apply over primary dressing as directed. Secured With: [employer], 4.5x3.1 (in/yd) Every Other Day/15 Days Discharge Instructions: Secure with Kerlix as directed. Secured  With: 76M Medipore H Soft Cloth Surgical Tape, 2x2 (in/yd) Every Other Day/15 Days Discharge Instructions: Secure dressing with tape as directed. Electronic Signature(s) Signed: 09/01/2020 5:02:17 PM By: [name] MD Signed: 09/01/2020 5:56:30 PM By: [name] RN, BSN Entered By: [name] on 09/01/2020 09:07:30 -------------------------------------------------------------------------------- Problem List Details Patient Name: Date of Service: [name], [name] 09/01/2020 8:00 A M Medical Record Number: [id number] Patient Account Number: [id number] Date of Birth/Sex: Treating RN: Jul 10, 1985 (35 y.o. 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 08/10/2020 No Yes Inactive Problems ICD-10 Code Description Active Date Inactive Date L97.518 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ity 10/24/2019 10/24/2019 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, right ankle and foot 10/24/2019 10/24/2019 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, left ankle and foot 10/24/2019 10/24/2019 B95.62 Methicillin resistant Staphylococcus aureus infection  as the cause of diseases 10/24/2019 10/24/2019 classified elsewhere Resolved Problems Electronic Signature(s) Signed: 09/01/2020 5:02:17 PM By: [name] MD Entered By: [name] on 09/01/2020  09:25:32 -------------------------------------------------------------------------------- Progress Note Details Patient Name: Date of Service: [name], [name] 09/01/2020 8:00 A M Medical Record Number: [id number] Patient Account Number: [id number] Date of Birth/Sex: Treating RN: [birth year] (35 y.o. 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 He comes in for review of a wound over the plantar fifth metatarsal head extending into the lateral part of the foot. He was followed for this previously by his podiatrist Dr. Cornelius Moras. As the patient tells his story he went to see podiatry first for a swelling he developed on the lateral part of his fifth metatarsal head in May. He states this was "open" by podiatry and the area closed. He was followed up in June and it was again opened callus removed and it closed promptly. There were plans being made for surgery on the fifth metatarsal head in June however his blood sugar was apparently too high for anesthesia. Apparently the area was debrided and opened again in June and it is never closed since. Looking over the records from podiatry I am really not able to follow this. It was clear when he was first seen it was before 5/14 at that point he already had a wound. By 5/17 the ulcer was resolved. I do not see anything about a procedure. On 5/28 noted to have pre-ulcerative moderate keratosis. X-ray noted 1/5 contracted toe and tailor's bunion and metatarsal deformity. On a visit date on 09/28/2018 the dorsal part of the left foot it healed and resolved. There was concern about swelling in his lower extremity he was sent to the ER.. As far as I can tell he was seen in the ER on 7/12  with an ulcer on his left foot. A DVT rule out of the left leg was negative. I do not think I have complete records from podiatry but I am not able to verify the procedures this patient states he had. He states after the last procedure the wound has never closed although I am not able to follow this in the records I have from podiatry. He has not had a recent x-ray The patient has been using Neosporin on the wound. He is wearing a Darco shoe. He is still very active up on his foot working and exercising. Past medical history; type 2 diabetes ketosis-prone, leg swelling with a negative DVT study in July. Non-smoker ABI in our clinic was 0.85 on the left 10/16; substantial wound on the plantar left fifth met head extending laterally almost to the dorsal fifth MTP. We have been using silver alginate we gave him a Darco forefoot off loader. An x-ray did not show evidence of osteomyelitis did note soft tissue emphysema which I think was due to gas tracking through an open wound. There is no doubt in my mind he requires an MRI 10/23; MRI not booked until 3 November at the earliest this is largely due to his glucose sensor in the right arm. We have been using silver alginate. There has been an improvement 10/29; I am still not exactly sure when his MRI is booked for. He says it is the third but it  is the 10th in epic. This definitely needs to be done. He is running a low-grade fever today but no other symptoms. No real improvement in the 1 02/26/2019 patient presents today for a follow-up visit here in our clinic he is last been seen in the clinic on October 29. Subsequently we were working on getting MRI to evaluate and see what exactly was going on and where we would need to go from the standpoint of whether or not he had osteomyelitis and again what treatments were going be required. Subsequently the patient ended up being admitted to the hospital on 02/07/2019 and was discharged on 02/14/2019. This is a  somewhat interesting admission with a discharge diagnosis of pneumonia due to COVID-19 although he was positive for COVID-19 when tested at the urgent care but negative x2 when he was actually in the hospital. With that being said he did have acute respiratory failure with hypoxia and it was noted he also have a left foot ulceration with osteomyelitis. With that being said he did require oxygen for his pneumonia and I level 4 L. He was placed on antivirals and steroids for the COVID-19. He was also transferred to the [facility] at one point. Nonetheless he did subsequently discharged home and since being home has done much better in that regard. The CT angiogram did not show any pulmonary embolism. With regard to the osteomyelitis the patient was placed on vancomycin and Zosyn while in the hospital but has been changed to Augmentin at discharge. It was also recommended that he follow- up with wound care and podiatry. Podiatry however wanted him to see [name] according to the patient prior to them doing anything further. His hemoglobin A1c was 9.9 as noted in the hospital. Have an MRI of the left foot performed while in the hospital on 02/04/2019. 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 There is an overlying plantar open wound noted an abscess tracking back along the lateral aspect of the fifth metatarsal shaft. There is otherwise diffuse cellulitis and mild fasciitis without findings of polymyositis. The patient did have recently pneumonia secondary to COVID-19 I looked in the chart through epic and it does appear that the patient may need to have an additional x-ray just to ensure everything is cleared and that he has no airspace disease prior to putting him into the chamber. 03/05/2019; patient was readmitted to the clinic last week. He was hospitalized twice for a viral upper respiratory tract  infection from 11/1 through 11/4 and then 11/5 through 11/12 ultimately this turned out to be Covid pneumonitis. Although he was discharged on oxygen he is not using it. He says he feels fine. He has no exercise limitation no cough no sputum. His O2 sat in our clinic today was 100% on room air. 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 He received Vanco and Zosyn in the hospital and then was discharged on 2 weeks of Augmentin. I do not see any relevant cultures. He was supposed to follow-up with infectious disease but I do not see that he has an appointment. 12/8; patient saw Dr. Novella Olive of infectious disease last week. He felt that he had had adequate antibiotic therapy. He did not go to follow-up with Dr. Amalia Hailey of podiatry and I have again talked to him about  the pros and cons of this. He does not want to consider a ray amputation of this time. He is aware of the risks of recurrence, migration etc. He started HBO today and tolerated this well. He can complete the Augmentin that I gave him last week. I have looked over the lab work that Dr. Chana Bode ordered his C-reactive protein was 3.3 and his sedimentation rate was 17. The C-reactive protein is never really been measurably that high in this patient 12/15; not much change in the wound today however he has undermining along the lateral part of the foot again more extensively than last week. He has some rims of epithelialization. We have been using silver alginate. He is undergoing hyperbarics but did not dive today 12/18; in for his obligatory first total contact cast change. Unfortunately there was pus coming from the undermining area around his fifth metatarsal head. This was cultured but will preclude reapplication of a cast. He is seen in conjunction with HBO 12/24; patient had staph lugdunensis in the wound in the undermining area  laterally last time. We put him on doxycycline which should have covered this. The wound looks better today. I am going to give him another week of doxycycline before reattempting the total contact cast 12/31; the patient is completing antibiotics. Hemorrhagic debris in the distal part of the wound with some undermining distally. He also had hyper granulation. Extensive debridement with a #5 curette. The infected area that was on the lateral part of the fifth met head is closed over. I do not think he needs any more antibiotics. Patient was seen prior to HBO. Preparations for a total contact cast were made in the cast will be placed post hyperbarics 04/11/19; once again the patient arrives today without complaint. He had been in a cast all week noted that he had heavy drainage this week. This resulted in large raised areas of macerated tissue around the wound 1/14; wound bed looks better slightly smaller. Hydrofera Blue has been changing himself. He had a heavy drainage last week which caused a lot of maceration around the wound so I took him out of a total contact cast he says the drainage is actually better this week He is seen today in conjunction with HBO 1/21; returns to clinic. He was up in [place] for a day or 2 attending a funeral. He comes back in with the wound larger and with a large area of exposed bone. He had osteomyelitis and septic arthritis of the fifth left metatarsal head while he was in hospital. He received IV antibiotics in the hospital for a prolonged period of time then 3 weeks of Augmentin. Subsequently I gave him 2 weeks of doxycycline for more superficial wound infection. When I saw this last week the wound was smaller the surface of the wound looks satisfactory. 1/28; patient missed hyperbarics today. Bone biopsy I did last time showed Enterococcus faecalis and Staphylococcus lugdunensis . He has a wide area of exposed bone. We are going to use silver alginate as of  today. I had another ethical discussion with the patient. 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 In this situation I think the likelihood of healing this is low. Therefore I have recommended a ray amputation and with the patient's agreement I have referred him to Dr. Doran Durand. The other issue is that his compliance with hyperbarics has been minimal because of his work schedule and given his underlying decision I am going to stop this  today READMISSION 10/24/2019 MRI 09/29/2019 left foot IMPRESSION: 1. Apparent skin ulceration inferior and lateral to the 5th metatarsal base with underlying heterogeneous T2 signal and enhancement in the subcutaneous fat. Small peripherally enhancing fluid collections along the plantar and lateral aspects of the 5th metatarsal base suspicious for abscesses. 2. Interval amputation through the mid 5th metatarsal with nonspecific low-level marrow edema and enhancement. Given the proximity to the adjacent soft tissue inflammatory changes, osteomyelitis cannot be excluded. 3. The additional bones appear unremarkable. MRI 09/29/2019 right foot IMPRESSION: 1. Soft tissue ulceration lateral to the 5th MTP joint. There is low-level T2 hyperintensity within the 4th and 5th metatarsal heads and adjacent proximal phalanges without abnormal T1 signal or cortical destruction. These findings are nonspecific and could be seen with early marrow edema, hyperemia or early osteomyelitis. No evidence of septic joint. 2. Mild tenosynovitis and synovial enhancement associated with the extensor digitorum tendons at the level of the midfoot. 3. Diffuse low-level muscular T2 hyperintensity and enhancement, most consistent with diabetic myopathy. LEFT FOOT BONE Methicillin resistant staphylococcus aureus Staphylococcus lugdunensis MIC MIC CIPROFLOXACIN >=8 RESISTANT Resistant <=0.5 SENSI... Sensitive CLINDAMYCIN <=0.25 SENS... Sensitive >=8  RESISTANT Resistant ERYTHROMYCIN >=8 RESISTANT Resistant >=8 RESISTANT Resistant GENTAMICIN <=0.5 SENSI... Sensitive <=0.5 SENSI... Sensitive Inducible Clindamycin NEGATIVE Sensitive NEGATIVE Sensitive OXACILLIN >=4 RESISTANT Resistant 2 SENSITIVE Sensitive RIFAMPIN <=0.5 SENSI... Sensitive <=0.5 SENSI... Sensitive TETRACYCLINE <=1 SENSITIVE Sensitive <=1 SENSITIVE Sensitive TRIMETH/SULFA <=10 SENSIT Sensitive <=10 SENSIT Sensitive ... Marland Kitchen.. VANCOMYCIN 1 SENSITIVE Sensitive <=0.5 SENSI... Sensitive Right foot bone . Component 3 wk ago Specimen Description BONE Special Requests RIGHT 4 METATARSAL SAMPLE B Gram Stain NO WBC SEEN NO ORGANISMS SEEN Culture RARE METHICILLIN RESISTANT STAPHYLOCOCCUS AUREUS NO ANAEROBES ISOLATED Performed at [facility], [place] [address] [place], [place] [address] Report Status 10/08/2019 FINAL Organism ID, Bacteria METHICILLIN RESISTANT STAPHYLOCOCCUS AUREUS Resulting Agency [facility] Susceptibility Methicillin resistant staphylococcus aureus MIC CIPROFLOXACIN >=8 RESISTANT Resistant CLINDAMYCIN <=0.25 SENS... Sensitive ERYTHROMYCIN >=8 RESISTANT Resistant GENTAMICIN <=0.5 SENSI... Sensitive Inducible Clindamycin NEGATIVE Sensitive OXACILLIN >=4 RESISTANT Resistant RIFAMPIN <=0.5 SENSI... Sensitive TETRACYCLINE <=1 SENSITIVE Sensitive TRIMETH/SULFA <=10 SENSIT Sensitive ... VANCOMYCIN 1 SENSITIVE Sensitive This is a patient we had in clinic earlier this year with a wound over his left fifth metatarsal head. 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 Doran Durand on 05/16/2019. He seems to have developed problems with wounds on his bilateral feet in June 2021 although he  may have had problems earlier than this. He was in an urgent care with a right foot ulcer on 09/26/2019 and given a course of doxycycline. This was apparently after having trouble getting into see orthopedics. He was seen by podiatry on 09/28/2019 noted to have bilateral lower extremity ulcers including the left lateral fifth metatarsal base and the right subfifth met head. It was noted that had purulent drainage at that time. He required hospitalization from 6/20 through 7/2. This was because of worsening right foot wounds. He underwent bilateral operative incision and drainage and bone biopsies bilaterally. Culture results are listed above. He has been referred back to clinic by Dr. Jacqualyn Posey of podiatry. He is also followed by Dr. Megan Salon who saw him yesterday. He was discharged from hospital on Zyvox Flagyl and Levaquin and yesterday  changed to doxycycline Flagyl and Levaquin. His inflammatory markers on 6/26 showed a sedimentation rate of 129 and a C-reactive protein of 5. This is improved to 14 and 1.3 respectively. This would indicate improvement. ABIs in our clinic today were 1.23 on the right and 1.20 on the left 11/01/2019 on evaluation today patient appears to be doing fairly well in regard to the wounds on his feet at this point. Fortunately there is no signs of active infection at this time. No fevers, chills, nausea, vomiting, or diarrhea. He currently is seeing infectious disease and still under their care at this point. Subsequently he also has both wounds which she has not been using collagen on as he did not receive that in his packaging he did not call us and let us know that. Apparently that just was missed on the order. Nonetheless we will get that straightened out today. 8/9-Patient returns for bilateral foot wounds, using Prisma with hydrogel moistened dressings, and the wounds appear stable. Patient using surgical shoes, avoiding much pressure or weightbearing as much as  possible 8/16; patient has bilateral foot wounds. 1 on the right lateral foot proximally the other is on the left mid lateral foot. Both required debridement of callus and thick skin around the wounds. We have been using silver collagen 8/27; patient has bilateral lateral foot wounds. The area on the left substantially surrounded by callus and dry skin. This was removed from the wound edge. The underlying wound is small. The area on the right measured somewhat smaller today. 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 Unfortunately I did not update his antibiotics during today's visit. 9/10 I reviewed Dr. Hale Bogus last notes he felt he had completed antibiotics his inflammatory markers were reasonably well controlled. He has a small wound on the lateral left foot and a tiny area on the right which is just above closed. He is using Hydrofera Blue with border foam he has bilateral surgical shoes 9/24; 2 week f/u. doing well. right foot is closed. left foot still undermined. 10/14; right foot remains closed at the fifth met head. The area over the base of the left fifth metatarsal has a small open area but considerable undermining towards the plantar foot. Thick callus skin around this suggests an adequate pressure relief. We have talked about this. He says he is going to go back into his cam boot. I suggested a total contact cast he did not seem enamored with this suggestion 10/26; left foot base of the fifth metatarsal. Same condition as last time. He has skin over the area with an open wound however the skin is not adherent. He went to see Dr. Earleen Newport who did an x-ray and culture of his foot I have not reviewed the x-ray but the patient was not told anything. He is on doxycycline 11/11; since the patient was last here he was in the emergency room on 10/30 he was concerned about swelling in the left foot. They did not do any cultures  or x-rays. They changed his antibiotics to cephalexin. Previous culture showed group B strep. The cephalexin is appropriate as doxycycline has less than predictable coverage. Arrives in clinic today with swelling over this area under the wound. He also has a new wound on the right fifth metatarsal head 11/18; the patient has a difficult wound on the lateral aspect of the left fifth metatarsal head. The wound was almost ballotable last week I opened it  slightly expecting to see purulence however there was just bleeding. I cultured this this was negative. X-ray unchanged. We are trying to get an MRI but I am not sure were going to be able to get this through his insurance. He also has an area on the right lateral fifth metatarsal head this looks healthier 12/3; the patient finally got our MRI. Surprisingly this did not show osteomyelitis. I did show the soft tissue ulceration at the lateral plantar aspect of the fifth metatarsal base with a tiny residual 6 mm abscess overlying the superficial fascia I have tried to culture this area I have not been able to get this to grow anything. Nevertheless the protruding tissue looks aggravated. I suspect we should try to treat the underlying "abscess with broad-spectrum antibiotics. I am going to start him on Levaquin and Flagyl. He has much less edema in his legs and I am going to continue to wrap his legs and see him weekly 12/10. I started Levaquin and Flagyl on him last week. He just picked up the Flagyl apparently there was some delay. The worry is the wound on the left fifth metatarsal base which is substantial and worsening. His foot looks like he inverts at the ankle making this a weightbearing surface. Certainly no improvement in fact I think the measurements of this are somewhat worse. We have been using 12/17; he apparently just got the Levaquin yesterday this is 2 weeks after the fact. He has completed the Flagyl. The area over the left fifth  metatarsal base still has protruding granulation tissue although it does not look quite as bad as it did some weeks ago. He has severe bilateral lymphedema although we have not been treating him for wounds on his legs this is definitely going to require compression. There was so much edema in the left I did not wish to put him in a total contact cast today. I am going to increase his compression from 3-4 layer. The area on the right lateral fifth met head actually look quite good and superficial. 12/23; patient arrived with callus on the right fifth met head and the substantial hyper granulated callused wound on the base of his fifth metatarsal. He says he is completing his Levaquin in 2 days but I do not think that adds up with what I gave him but I will have to double check this. We are using Hydrofera Blue on both areas. My plan is to put the left leg in a cast the week after New Year's 04/06/2020; patient's wounds about the same. Right lateral fifth metatarsal head and left lateral foot over the base of the fifth metatarsal. There is undermining on the left lateral foot which I removed before application of total contact cast continuing with Hydrofera Blue new. Patient tells me he was seen by endocrinology today lab work was done [Dr. Kerr]. Also wondering whether he was referred to cardiology. I went over some lab work from previously does not have chronic renal failure certainly not nephrotic range proteinuria he does have very poorly controlled diabetes but this is not his most updated lab work. Hemoglobin A1c has been over 11 1/10; the patient had a considerable amount of leakage towards mid part of his left foot with macerated skin however the wound surface looks better the area on the right lateral fifth met head is better as well. I am going to change the dressing on the left foot under the total contact cast to silver alginate, continue with  Hydrofera Blue on the right. 1/20; patient was in  the total contact cast for 10 days. Considerable amount of drainage although the skin around the wound does not look too bad on the left foot. The area on the right fifth metatarsal head is closed. Our nursing staff reports large amount of drainage out of the left lateral foot wound 1/25; continues with copious amounts of drainage described by our intake staff. PCR culture I did last week showed E. coli and Enterococcus faecalis and low quantities. Multiple resistance genes documented including extended spectrum beta lactamase, MRSA, MRSE, quinolone, tetracycline. The wound is not quite as good this week as it was 5 days ago but about the same size 2/3; continues with copious amounts of malodorous drainage per our intake nurse. The PCR culture I did 2 weeks ago showed E. coli and low quantities of Enterococcus. There were multiple resistance genes detected. I put Neosporin on him last week although this does not seem to have helped. The wound is slightly deeper today. Offloading continues to be an issue here although with the amount of drainage she has a total contact cast is just not going to work 2/10; moderate amount of drainage. Patient reports he cannot get his stocking on over the dressing. I told him we have to do that the nurse gave him suggestions on how to make this work. The wound is on the bottom and lateral part of his left foot. Is cultured predominantly grew low amounts of Enterococcus, E. coli and anaerobes. There were multiple resistance genes detected including extended spectrum beta lactamase, quinolone, tetracycline. I could not think of an easy oral combination to address this so for now I am going to do topical antibiotics provided by [facility] I think the main agents here are vancomycin and an aminoglycoside. We have to be able to give him access to the wounds to get the topical antibiotic on 2/17; moderate amount of drainage this is unchanged. He has his Keystone topical  antibiotic against the deep tissue culture organisms. He has been using this and changing the dressing daily. Silver alginate on the wound surface. 2/24; using Keystone antibiotic with silver alginate on the top. He had too much drainage for a total contact cast at one point although I think that is improving and I think in the next week or 2 it might be possible to replace a total contact cast I did not do this today. In general the wound surface looks healthy however he continues to have thick rims of skin and subcutaneous tissue around the wide area of the circumference which I debrided 06/04/2020 upon evaluation today patient appears to be doing well in regard to his wound. I do feel like he is showing signs of improvement. There is little bit of callus and dead tissue around the edges of the wound as well as what appears to be a little bit of a sinus tract that is off to the side laterally I would perform debridement to clear that away today. 3/17; left lateral foot. The wound looks about the same as I remember. Not much depth surface looks healthy. No evidence of infection 3/25; left lateral foot. Wound surface looks about the same. Separating epithelium from the circumference. There really is no evidence of infection here however not making progress by my view 3/29; left lateral foot. Surface of the wound again looks reasonably healthy still thick skin and subcutaneous tissue around the wound margins. There is no evidence of infection. One  of the concerns being brought up by the nurses has again the amount of drainage vis--vis continued use of a total contact cast 4/5; left lateral foot at roughly the base of the fifth metatarsal. Nice healthy looking granulated tissue with rims of epithelialization. The overall wound measurements are not any better but the tissue looks healthy. The only concern is the amount of drainage although he has no surrounding maceration with what we have been doing  recently to absorb fluid and protect his skin. He also has lymphedema. 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 He states that the kerlix/coban under the TCC rolled down and could not get it back up. He has been using an offloading scooter and has somehow hurt his right foot using this device. This happened last week. He states that the side of his right foot developed a blister and opened. The top of his foot also has a few small open wounds he thinks is due to his socks rubbing in his shoes. He has not been using any dressings to the wound. He denies purulent drainage, fever/chills or erythema to the wounds. 4/22; patient presents for 1 week follow-up. He developed new wounds to the right foot that were evaluated at last clinic visit. He continues to have a total contact cast to the left leg and he reports no issues. He has been using silver collagen to the right foot wounds with no issues. He denies purulent drainage, fever/chills or erythema to the right foot wounds. He has no complaints today 4/25; patient presents for 1 week follow-up. He has a total contact cast of the left leg and reports no issues. He has been using silver alginate to the right foot wound. He denies purulent drainage, fever/chills or erythema to the right foot wounds. 5/2 patient presents for 1 week follow-up. T contact cast on the left. The wound which is on the base of the plantar foot at the base of the fifth metatarsal otal actually looks quite good and dimensions continue to gradually contract. HOWEVER the area on the right lateral fifth metatarsal head is much larger than what I remember from 2 weeks ago. Once more is he has significant levels of hypergranulation.  Noteworthy that he had this same hyper granulated response on his wound on the left foot at one point in time. So much so that he I thought there was an underlying fluid collection. Based on this I think this just needs debridement. 5/9; the wound on the left actually continues to be gradually smaller with a healthy surface. Slight amount of drainage and maceration of the skin around but not too bad. However he has a large wound over the right fifth metatarsal head very much in the same configuration as his left foot wound was initially. I used silver nitrate to address the hyper granulated tissue no mechanical debridement 5/16; area on the left foot did not look as healthy this week deeper thick surrounding macerated skin and subcutaneous tissue. oo The area on the right foot fifth met head was about the same oo The area on the right ankle that we identified last week is completely broken down into an open wound presumably a stocking rubbing issue 5/23; patient has been  using a total contact cast to the left side. He has been using silver alginate underneath. He has also been using silver alginate to the right foot wounds. He has no complaints today. He denies any signs of infection. 5/31; the left-sided wound looks some better measure smaller surface granulation looks better. We have been using silver alginate under the total contact cast oo The large area on his right fifth met head and right dorsal foot look about the same still using silver alginate Objective Constitutional Sitting or standing Blood Pressure is within target range for patient.. Pulse regular and within target range for patient.Marland Kitchen Respirations regular, non-labored and within target range.. Temperature is normal and within the target range for the patient.Marland Kitchen Appears in no distress. Vitals Time Taken: 8:34 AM, Height: 77 in, Source: Stated, Weight: 280 lbs, Source: Stated, BMI: 33.2, Temperature: 98.8 F, Pulse: 94 bpm,  Respiratory Rate: 18 breaths/min, Blood Pressure: 138/86 mmHg, Capillary Blood Glucose: 90 mg/dl. General Notes: glucose per pt report this am General Notes: Wound exam; left foot laterally. Surface looks better. Granulation looks healthier. Some maceration of skin around the wound reflecting the drainage but generally not too bad oo Right fifth met head large wound no obvious infection no debridement. Dorsal foot also in the same condition Integumentary (Hair, Skin) Wound #3 status is Open. Original cause of wound was Trauma. The date acquired was: 10/02/2019. The wound has been in treatment 44 weeks. The wound is located on the Left,Lateral Foot. The wound measures 2.3cm length x 1.8cm width x 0.6cm depth; 3.252cm^2 area and 1.951cm^3 volume. There is Fat Layer (Subcutaneous Tissue) exposed. There is no tunneling noted, however, there is undermining starting at 2:00 and ending at 6:00 with a maximum distance of 0.6cm. There is a large amount of serosanguineous drainage noted. The wound margin is distinct with the outline attached to the wound base. There is large (67- 100%) red granulation within the wound bed. There is no necrotic tissue within the wound bed. Wound #5 status is Open. Original cause of wound was Gradually Appeared. The date acquired was: 07/07/2020. The wound has been in treatment 7 weeks. The wound is located on the Right,Lateral Metatarsal head fifth. The wound measures 2.7cm length x 3.3cm width x 0.7cm depth; 6.998cm^2 area and 4.899cm^3 volume. There is Fat Layer (Subcutaneous Tissue) exposed. There is no tunneling noted, however, there is undermining starting at 8:00 and ending at 10:00 with a maximum distance of 0.4cm. There is a medium amount of serosanguineous drainage noted. The wound margin is well defined and not attached to the wound base. There is large (67-100%) red, pink granulation within the wound bed. There is a small (1-33%) amount of necrotic tissue within the  wound bed including Adherent Slough. Wound #7 status is Open. Original cause of wound was Gradually Appeared. The date acquired was: 08/17/2020. The wound has been in treatment 2 weeks. The wound is located on the Right,Anterior Ankle. The wound measures 2cm length x 2.3cm width x 0.1cm depth; 3.613cm^2 area and 0.361cm^3 volume. There is Fat Layer (Subcutaneous Tissue) exposed. There is no tunneling or undermining noted. There is a medium amount of serosanguineous drainage noted. The wound margin is flat and intact. There is large (67-100%) pink, friable granulation within the wound bed. There is a small (1-33%) amount of necrotic tissue within the wound bed including Adherent Slough. 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 There was a T Contact Cast otal Procedure by [name], MD. Post procedure Diagnosis Wound #3: Same as Pre-Procedure Plan Follow-up Appointments: Return Appointment in 1 week. - ***60 minutes for cast*** - with Dr. Arcola Jansky Shower/ Hygiene: May shower with protection but do not get wound dressing(s) wet. - use a cast protector to left leg. Edema Control - Lymphedema / SCD / Other: Elevate legs to the level of the heart or above for 30 minutes daily and/or when sitting, a frequency of: - throughout the day Avoid standing for long periods of time. Exercise regularly Moisturize legs daily. - right leg every night before bed. Compression stocking or Garment 20-30 mm/Hg pressure to: - Apply to right leg in the morning and remove at  night. Off-Loading: T Contact Cast to Left Lower Extremity otal Open toe surgical shoe to: - right foot WOUND #3: - Foot Wound Laterality: Left, Lateral Cleanser: Soap and Water 1 x Per Week/30 Days Discharge Instructions: May shower and wash wound with dial antibacterial soap and water prior to dressing change. Peri-Wound Care: Zinc Oxide Ointment 30g tube 1 x Per Week/30 Days Discharge Instructions: Apply Zinc Oxide to periwound with each dressing change as needed. Prim Dressing: KerraCel Ag Gelling Fiber Dressing, 2x2 in (silver alginate) 1 x Per Week/30 Days ary Discharge Instructions: Apply silver alginate to wound bed as instructed Secondary Dressing: Woven Gauze Sponge, Non-Sterile 4x4 in 1 x Per Week/30 Days Discharge Instructions: Apply over primary dressing as directed. Secondary Dressing: ABD Pad, 8x10 1 x Per Week/30 Days Discharge Instructions: T down before applying kerlix. Apply over primary dressing as directed. ape Secondary Dressing: Zetuvit Plus 4x8 in 1 x Per Week/30 Days Discharge Instructions: Apply over primary dressing as directed. Secured With: [employer], 4.5x3.1 (in/yd) 1 x Per Week/30 Days Discharge Instructions: Secure with Kerlix as directed. WOUND #5: - Metatarsal head fifth Wound Laterality: Right, Lateral Cleanser: Normal Saline (Generic) Every Other Day/15 Days Discharge Instructions: Cleanse the wound with Normal Saline prior to applying a clean dressing using gauze sponges, not tissue or cotton balls. Cleanser: Soap and Water Every Other Day/15 Days Discharge Instructions: May shower and wash wound with dial antibacterial soap and water prior to dressing change. Prim Dressing: KerraCel Ag Gelling Fiber Dressing, 2x2 in (silver alginate) (Generic) Every Other Day/15 Days ary Discharge Instructions: Apply silver alginate to wound bed as instructed Secondary Dressing: ABD Pad, 8x10 (Generic) Every Other Day/15 Days Discharge Instructions: Apply  over primary dressing as directed. Secondary Dressing: Optifoam Non-Adhesive Dressing, 4x4 in (Generic) Every Other Day/15 Days Discharge Instructions: Apply over primary dressing, cut to make foam donut to offload Secured With: Kerlix Roll Sterile, 4.5x3.1 (in/yd) (Generic) Every Other Day/15 Days Discharge Instructions: Secure with Kerlix as directed. Secured With: 76M Medipore H Soft Cloth Surgical T ape, 2x2 (in/yd) (Generic) Every Other Day/15 Days Discharge Instructions: Secure dressing with tape as directed. WOUND #7: - Ankle Wound Laterality: Right, Anterior Cleanser: Normal Saline Every Other Day/15 Days Discharge Instructions: Cleanse the wound with Normal Saline prior to applying a clean dressing using gauze sponges, not tissue or cotton balls. Cleanser: Soap and Water Every Other Day/15 Days Discharge Instructions: May shower and wash wound with dial antibacterial  soap and water prior to dressing change. Prim Dressing: KerraCel Ag Gelling Fiber Dressing, 2x2 in (silver alginate) Every Other Day/15 Days ary Discharge Instructions: Apply silver alginate to wound bed as instructed Secondary Dressing: Woven Gauze Sponge, Non-Sterile 4x4 in Every Other Day/15 Days Discharge Instructions: Apply over primary dressing as directed. Secondary Dressing: ABD Pad, 8x10 Every Other Day/15 Days Discharge Instructions: Apply over primary dressing as directed. Secured With: [employer], 4.5x3.1 (in/yd) Every Other Day/15 Days Discharge Instructions: Secure with Kerlix as directed. Secured With: 56M Medipore H Soft Cloth Surgical T ape, 2x2 (in/yd) Every Other Day/15 Days Discharge Instructions: Secure dressing with tape as directed. 1. I continue to put silver alginate and total contact cast on the left may consider Hydrofera Blue 2. Silver alginate to the right fifth met head and the dorsal foot. I would hope I can eventually get the left leg to close over so I can put a cast  here Electronic Signature(s) Signed: 09/01/2020 5:02:17 PM By: [name] MD Entered By: [name] on 09/01/2020 09:30:21 -------------------------------------------------------------------------------- Total Contact Cast Details Patient Name: Date of Service: [name], [name] 09/01/2020 8:00 A M Medical Record Number: [id number] Patient Account Number: [id number] Date of Birth/Sex: Treating RN: [birth year] (35 y.o. [name] Primary Care Provider: [name] Other Clinician: Referring Provider: Treating Provider/Extender: [name] in Treatment: 51 T Contact Cast Applied for Wound Assessment: otal Wound #3 Left,Lateral Foot Performed By: Physician [name], MD Post Procedure Diagnosis Same as Pre-procedure Electronic Signature(s) Signed: 09/01/2020 5:02:17 PM By: [name] MD Entered By: [name] on 09/01/2020 09:25:54 -------------------------------------------------------------------------------- SuperBill Details Patient Name: Date of Service: [name], [name] 09/01/2020 Medical Record Number: [id number] Patient Account Number: [id number] Date of Birth/Sex: Treating RN: January 25, 1986 (35 y.o. 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 Facility Procedures CPT4 Code: 16606301 Description: [phone] - APPLY TOTAL CONTACT LEG CAST ICD-10 Diagnosis Description L97.528 Non-pressure chronic ulcer of other part of left foot with  other specified sever Modifier: ity Quantity: 1 Physician Procedures : CPT4 Code Description Modifier [id number] - WC PHYS APPLY TOTAL CONTACT CAST ICD-10 Diagnosis Description L97.528 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ity Quantity: 1 Electronic Signature(s) Signed: 09/01/2020 5:02:17 PM By: [name] MD Entered By: [name] on 09/01/2020 09:31:22

## 2020-09-02 NOTE — Progress Notes (Signed)
[name], [name] ([id number]) Visit Report for 09/01/2020 Arrival Information Details Patient Name: Date of Service: [name] 09/01/2020 8:00 Max M Medical Record Number: [id number] Patient Account Number: [id number] Date of Birth/Sex: Treating RN: 10/09/1985 (34 y.o. M) [name] Primary Care Provider: [name], [name] Other Clinician: Referring Provider: Treating Provider/Extender: [name] [name] Weeks in Treatment: 44 Visit Information History Since Last Visit Added or deleted any medications: No Patient Arrived: Ambulatory Any new allergies or adverse reactions: No Arrival Time: 08:33 Had Max fall or experienced change in No Accompanied By: self activities of daily living that may affect Transfer Assistance: None risk of falls: Patient Identification Verified: Yes Signs or symptoms of abuse/neglect since last visito No Secondary Verification Process Completed: Yes Hospitalized since last visit: No Patient Requires Transmission-Based Precautions: No Implantable device outside of the clinic excluding No Patient Has Alerts: No cellular tissue based products placed in the center since last visit: Has Dressing in Place as Prescribed: Yes Has Footwear/Offloading in Place as Prescribed: Yes Left: T Contact Cast otal Pain Present Now: No Electronic Signature(s) Signed: 09/02/2020 6:06:42 PM By: Boehlein, Linda RN, BSN Entered By: [name] on 09/01/2020 08:33:58 -------------------------------------------------------------------------------- Encounter Discharge Information Details Patient Name: Date of Service: [name] 09/01/2020 8:00 Max M Medical Record Number: [id number] Patient Account Number: [id number] Date of Birth/Sex: Treating RN: 11/05/1985 (34 y.o. M) [name] Primary Care Provider: [name], [name] Other Clinician: Referring Provider: Treating Provider/Extender: [name] [name] Weeks in Treatment:  44 Encounter Discharge Information Items Discharge Condition: Stable Ambulatory Status: Ambulatory Discharge Destination: Home Transportation: Private Auto Schedule Follow-up Appointment: Yes Clinical Summary of Care: Provided on 09/01/2020 Form Type Recipient Paper Patient Patient Electronic Signature(s) Signed: 09/01/2020 9:41:52 AM By: [name] Entered By: [name] on 09/01/2020 09:41:52 -------------------------------------------------------------------------------- Lower Extremity Assessment Details Patient Name: Date of Service: [name] 09/01/2020 8:00 Max M Medical Record Number: [id number] Patient Account Number: [id number] Date of Birth/Sex: Treating RN: 10/17/1985 (34 y.o. M) [name] Primary Care Provider: [name], [name] Other Clinician: Referring Provider: Treating Provider/Extender: [name] [name] Weeks in Treatment: 44 Edema Assessment Assessed: [Left: No] [Right: No] Edema: [Left: Yes] [Right: Yes] Calf Left: Right: Point of Measurement: 48 cm From Medial Instep 54.5 cm 49 cm Ankle Left: Right: Point of Measurement: 10 cm From Medial Instep 34 cm 29.8 cm Vascular Assessment Pulses: Dorsalis Pedis Palpable: [Left:Yes] [Right:Yes] Electronic Signature(s) Signed: 09/02/2020 6:06:42 PM By: Boehlein, Linda RN, BSN Entered By: [name] on 09/01/2020 08:54:35 -------------------------------------------------------------------------------- Multi Wound Chart Details Patient Name: Date of Service: [name] 09/01/2020 8:00 Max M Medical Record Number: [id number] Patient Account Number: [id number] Date of Birth/Sex: Treating RN: 07/03/1985 (34 y.o. M) [name] Primary Care Provider: [name], [name] Other Clinician: Referring Provider: Treating Provider/Extender: [name] [name] Weeks in Treatment: 44 Vital Signs Height(in): 77 Capillary Blood Glucose(mg/dl): 90 Weight(lbs):  280 Pulse(bpm): 94 Body Mass Index(BMI): 33 Blood Pressure(mmHg): 138/86 Temperature(°F): 98.8 Respiratory Rate(breaths/min): 18 Photos: [3:No Photos Left, Lateral Foot] [5:No Photos Right, Lateral Metatarsal head fifth] [7:No Photos Right, Anterior Ankle] Wound Location: [3:Trauma] [5:Gradually Appeared] [7:Gradually Appeared] Wounding Event: [3:Diabetic Wound/Ulcer of the Lower] [5:Diabetic Wound/Ulcer of the Lower] [7:Diabetic Wound/Ulcer of the Lower] Primary Etiology: [3:Extremity Type II Diabetes] [5:Extremity Type II Diabetes] [7:Extremity Type II Diabetes] Comorbid History: [3:10/02/2019] [5:07/07/2020] [7:08/17/2020] Date Acquired: [3:44] [5:7] [7:2] Weeks of Treatment: [3:Open] [5:Open] [7:Open] Wound Status: [3:2.3x1.8x0.6] [5:2.7x3.3x0.7] [7:2x2.3x0.1] Measurements L x W x [name] (  cm) [3:3.252] [5:6.998] [7:3.613] Max (cm) : rea [3:1.951] [5:4.899] [7:0.361] Volume (cm) : [3:-97.20%] [5:20.40%] [7:-59.20%] % Reduction in Max rea: [3:-1082.40%] [5:-11.40%] [7:-59.00%] % Reduction in Volume: [3:2] [5:8] Starting Position 1 (o'clock): [3:6] [5:10] Ending Position 1 (o'clock): [3:0.6] [5:0.4] Maximum Distance 1 (cm): [3:Yes] [5:Yes] [7:No] Undermining: [3:Grade 2] [5:Grade 2] [7:Grade 2] Classification: [3:Large] [5:Medium] [7:Medium] Exudate Max mount: [3:Serosanguineous] [5:Serosanguineous] [7:Serosanguineous] Exudate Type: [3:red, brown] [5:red, brown] [7:red, brown] Exudate Color: [3:Distinct, outline attached] [5:Well defined, not attached] [7:Flat and Intact] Wound Margin: [3:Large (67-100%)] [5:Large (67-100%)] [7:Large (67-100%)] Granulation Max mount: [3:Red] [5:Red, Pink] [7:Pink, Friable] Granulation Quality: [3:None Present (0%)] [5:Small (1-33%)] [7:Small (1-33%)] Necrotic Max mount: [3:Fat Layer (Subcutaneous Tissue): Yes Fat Layer (Subcutaneous Tissue): Yes Fat Layer (Subcutaneous Tissue): Yes] Exposed Structures: [3:Fascia: No Tendon: No Muscle: No Joint: No Bone: No Small  (1-33%)] [5:Fascia: No Tendon: No Muscle: No Joint: No Bone: No None] [7:Fascia: No Tendon: No Muscle: No Joint: No Bone: No Small (1-33%)] Epithelialization: [3:T Contact Cast otal] [5:N/Max] [7:N/Max] Treatment Notes Electronic Signature(s) Signed: 09/01/2020 5:02:17 PM By: Robson, Michael MD Signed: 09/01/2020 5:56:30 PM By: Lynch, Shatara RN, BSN Entered By: [name] on 09/01/2020 09:25:39 -------------------------------------------------------------------------------- Multi-Disciplinary Care Plan Details Patient Name: Date of Service: [name] 09/01/2020 8:00 Max M Medical Record Number: [id number] Patient Account Number: [id number] Date of Birth/Sex: Treating RN: 05/25/1985 (34 y.o. M) [name] Primary Care Provider: [name], [name] Other Clinician: Referring Provider: Treating Provider/Extender: [name] [name] Weeks in Treatment: 44 Multidisciplinary Care Plan reviewed with physician Active Inactive Nutrition Nursing Diagnoses: Imbalanced nutrition Potential for alteratiion in Nutrition/Potential for imbalanced nutrition Goals: Patient/caregiver agrees to and verbalizes understanding of need to use nutritional supplements and/or vitamins as prescribed Date Initiated: 10/24/2019 Date Inactivated: 04/06/2020 Target Resolution Date: 04/03/2020 Goal Status: Met Patient/caregiver will maintain therapeutic glucose control Date Initiated: 10/24/2019 Target Resolution Date: 09/18/2020 Goal Status: Active Interventions: Assess HgA1c results as ordered upon admission and as needed Assess patient nutrition upon admission and as needed per policy Provide education on elevated blood sugars and impact on wound healing Provide education on nutrition Treatment Activities: Education provided on Nutrition : 07/07/2020 Notes: Wound/Skin Impairment Nursing Diagnoses: Impaired tissue integrity Knowledge deficit related to ulceration/compromised skin  integrity Goals: Patient/caregiver will verbalize understanding of skin care regimen Date Initiated: 10/24/2019 Target Resolution Date: 09/18/2020 Goal Status: Active Ulcer/skin breakdown will have Max volume reduction of 30% by week 4 Date Initiated: 10/24/2019 Date Inactivated: 01/16/2020 Target Resolution Date: 01/10/2020 Unmet Reason: no change in Goal Status: Unmet measurements. Interventions: Assess patient/caregiver ability to obtain necessary supplies Assess patient/caregiver ability to perform ulcer/skin care regimen upon admission and as needed Assess ulceration(s) every visit Provide education on ulcer and skin care Notes: Electronic Signature(s) Signed: 09/01/2020 5:56:30 PM By: Lynch, Shatara RN, BSN Entered By: [name] on 09/01/2020 09:07:41 -------------------------------------------------------------------------------- Pain Assessment Details Patient Name: Date of Service: [name] 09/01/2020 8:00 Max M Medical Record Number: [id number] Patient Account Number: [id number] Date of Birth/Sex: Treating RN: 12/05/1985 (34 y.o. M) [name] Primary Care Provider: [name], [name] Other Clinician: Referring Provider: Treating Provider/Extender: [name] [name] Weeks in Treatment: 44 Active Problems Location of Pain Severity and Description of Pain Patient Has Paino No Site Locations Rate the pain. Current Pain Level: 0 Pain Management and Medication Current Pain Management: Electronic Signature(s) Signed: 09/02/2020 6:06:42 PM By: Boehlein, Linda RN, BSN Entered By: [name] on 09/01/2020 08:34:59 -------------------------------------------------------------------------------- Patient/Caregiver Education Details Patient Name: Date of   Service: [name] 5/31/2022andnbsp8:00 Max M Medical Record Number: [id number] Patient Account Number: [id number] Date of Birth/Gender: Treating RN: 1985-09-13 (35 y.o. [name] Primary Care Physician: [name] Other Clinician: Referring Physician: Treating Physician/Extender: [name] in Treatment: 67 Education Assessment Education Provided To: Patient Education Topics Provided Wound/Skin Impairment: Methods: Explain/Verbal Responses: State content correctly [employer]) Signed: 09/01/2020 5:56:30 PM By: [name] RN, BSN Entered By: [name] on 09/01/2020 09:08:17 -------------------------------------------------------------------------------- Wound Assessment Details Patient Name: Date of Service: [name], [name] 09/01/2020 8:00 Max M Medical Record Number: [id number] Patient Account Number: [id number] Date of Birth/Sex: Treating RN: [birth year] (35 y.o. [name] Primary Care Clariece Roesler: [name] Other Clinician: Referring Alena Blankenbeckler: Treating Dashton Czerwinski/Extender: [name] in Treatment: 44 Wound Status Wound Number: 3 Primary Etiology: Diabetic Wound/Ulcer of the Lower Extremity Wound Location: Left, Lateral Foot Wound Status: Open Wounding Event: Trauma Comorbid History: Type II Diabetes Date Acquired: 10/02/2019 Weeks Of Treatment: 44 Clustered Wound: No Photos Wound Measurements Length: (cm) 2.3 Width: (cm) 1.8 Depth: (cm) 0.6 Area: (cm) 3.252 Volume: (cm) 1.951 % Reduction in Area: -97.2% % Reduction in Volume: -1082.4% Epithelialization: Small (1-33%) Tunneling: No Undermining: Yes Starting Position (o'clock): 2 Ending Position (o'clock): 6 Maximum Distance: (cm) 0.6 Wound Description Classification: Grade 2 Wound Margin: Distinct, outline attached Exudate Amount: Large Exudate Type: Serosanguineous Exudate Color: red, brown Foul Odor After Cleansing: No Slough/Fibrino No Wound Bed Granulation Amount: Large (67-100%) Exposed Structure Granulation Quality: Red Fascia Exposed: No Necrotic Amount: None Present (0%) Fat  Layer (Subcutaneous Tissue) Exposed: Yes Tendon Exposed: No Muscle Exposed: No Joint Exposed: No Bone Exposed: No Treatment Notes Wound #3 (Foot) Wound Laterality: Left, Lateral Cleanser Soap and Water Discharge Instruction: May shower and wash wound with dial antibacterial soap and water prior to dressing change. Peri-Wound Care Zinc Oxide Ointment 30g tube Discharge Instruction: Apply Zinc Oxide to periwound with each dressing change as needed. Topical Primary Dressing KerraCel Ag Gelling Fiber Dressing, 2x2 in (silver alginate) Discharge Instruction: Apply silver alginate to wound bed as instructed Secondary Dressing Woven Gauze Sponge, Non-Sterile 4x4 in Discharge Instruction: Apply over primary dressing as directed. Zetuvit Plus 4x8 in Discharge Instruction: Apply over primary dressing as directed. ABD Pad, 8x10 Discharge Instruction: T down before applying kerlix. Apply over primary dressing as directed. ape Secured With [employer], 4.5x3.1 (in/yd) Discharge Instruction: Secure with Kerlix as directed. Compression Wrap Compression Stockings Add-Ons Electronic Signature(s) Signed: 09/01/2020 5:24:49 PM By: Sandre Kitty Signed: 09/02/2020 6:06:42 PM By: Baruch Gouty RN, BSN Entered By: Sandre Kitty on 09/01/2020 16:04:28 -------------------------------------------------------------------------------- Wound Assessment Details Patient Name: Date of Service: [name], [name] 09/01/2020 8:00 Max M Medical Record Number: [id number] Patient Account Number: [id number] Date of Birth/Sex: Treating RN: 1986-02-20 (35 y.o. [name] Primary Care Ossiel Marchio: [name] Other Clinician: Referring Yeimi Debnam: Treating Taysha Majewski/Extender: [name] in Treatment: 44 Wound Status Wound Number: 5 Primary Etiology: Diabetic Wound/Ulcer of the Lower Extremity Wound Location: Right, Lateral Metatarsal head fifth Wound Status:  Open Wounding Event: Gradually Appeared Comorbid History: Type II Diabetes Date Acquired: 07/07/2020 Weeks Of Treatment: 7 Clustered Wound: No Photos Wound Measurements Length: (cm) 2.7 Width: (cm) 3.3 Depth: (cm) 0.7 Area: (cm) 6.998 Volume: (cm) 4.899 % Reduction in Area: 20.4% % Reduction in Volume: -11.4% Epithelialization: None Tunneling: No Undermining: Yes Starting Position (o'clock): 8 Ending Position (o'clock): 10 Maximum Distance: (cm) 0.4 Wound Description Classification: Grade  2 Wound Margin: Well defined, not attached Exudate Amount: Medium Exudate Type: Serosanguineous Exudate Color: red, brown Foul Odor After Cleansing: No Slough/Fibrino No Wound Bed Granulation Amount: Large (67-100%) Exposed Structure Granulation Quality: Red, Pink Fascia Exposed: No Necrotic Amount: Small (1-33%) Fat Layer (Subcutaneous Tissue) Exposed: Yes Necrotic Quality: Adherent Slough Tendon Exposed: No Muscle Exposed: No Joint Exposed: No Bone Exposed: No Treatment Notes Wound #5 (Metatarsal head fifth) Wound Laterality: Right, Lateral Cleanser Normal Saline Discharge Instruction: Cleanse the wound with Normal Saline prior to applying Max clean dressing using gauze sponges, not tissue or cotton balls. Soap and Water Discharge Instruction: May shower and wash wound with dial antibacterial soap and water prior to dressing change. Peri-Wound Care Topical Primary Dressing KerraCel Ag Gelling Fiber Dressing, 2x2 in (silver alginate) Discharge Instruction: Apply silver alginate to wound bed as instructed Secondary Dressing ABD Pad, 8x10 Discharge Instruction: Apply over primary dressing as directed. Optifoam Non-Adhesive Dressing, 4x4 in Discharge Instruction: Apply over primary dressing, cut to make foam donut to offload Secured With Kerlix Roll Sterile, 4.5x3.1 (in/yd) Discharge Instruction: Secure with Kerlix as directed. 3M Medipore H Soft Cloth Surgical T ape, 2x2  (in/yd) Discharge Instruction: Secure dressing with tape as directed. Compression Wrap Compression Stockings Add-Ons Electronic Signature(s) Signed: 09/01/2020 5:24:49 PM By: Dawkins, Destiny Signed: 09/02/2020 6:06:42 PM By: Boehlein, Linda RN, BSN Entered By: Dawkins, Destiny on 09/01/2020 16:02:04 -------------------------------------------------------------------------------- Wound Assessment Details Patient Name: Date of Service: [name] 09/01/2020 8:00 Max M Medical Record Number: [id number] Patient Account Number: [id number] Date of Birth/Sex: Treating RN: 02/21/1986 (34 y.o. M) [name] Primary Care Provider: [name], [name] Other Clinician: Referring Provider: Treating Provider/Extender: [name] [name] Weeks in Treatment: 44 Wound Status Wound Number: 7 Primary Etiology: Diabetic Wound/Ulcer of the Lower Extremity Wound Location: Right, Anterior Ankle Wound Status: Open Wounding Event: Gradually Appeared Comorbid History: Type II Diabetes Date Acquired: 08/17/2020 Weeks Of Treatment: 2 Clustered Wound: No Photos Wound Measurements Length: (cm) 2 Width: (cm) 2.3 Depth: (cm) 0.1 Area: (cm) 3.613 Volume: (cm) 0.361 % Reduction in Area: -59.2% % Reduction in Volume: -59% Epithelialization: Small (1-33%) Tunneling: No Undermining: No Wound Description Classification: Grade 2 Wound Margin: Flat and Intact Exudate Amount: Medium Exudate Type: Serosanguineous Exudate Color: red, brown Foul Odor After Cleansing: No Slough/Fibrino Yes Wound Bed Granulation Amount: Large (67-100%) Exposed Structure Granulation Quality: Pink, Friable Fascia Exposed: No Necrotic Amount: Small (1-33%) Fat Layer (Subcutaneous Tissue) Exposed: Yes Necrotic Quality: Adherent Slough Tendon Exposed: No Muscle Exposed: No Joint Exposed: No Bone Exposed: No Treatment Notes Wound #7 (Ankle) Wound Laterality: Right, Anterior Cleanser Normal  Saline Discharge Instruction: Cleanse the wound with Normal Saline prior to applying Max clean dressing using gauze sponges, not tissue or cotton balls. Soap and Water Discharge Instruction: May shower and wash wound with dial antibacterial soap and water prior to dressing change. Peri-Wound Care Topical Primary Dressing KerraCel Ag Gelling Fiber Dressing, 2x2 in (silver alginate) Discharge Instruction: Apply silver alginate to wound bed as instructed Secondary Dressing Woven Gauze Sponge, Non-Sterile 4x4 in Discharge Instruction: Apply over primary dressing as directed. ABD Pad, 8x10 Discharge Instruction: Apply over primary dressing as directed. Secured With Kerlix Roll Sterile, 4.5x3.1 (in/yd) Discharge Instruction: Secure with Kerlix as directed. 3M Medipore H Soft Cloth Surgical T ape, 2x2 (in/yd) Discharge Instruction: Secure dressing with tape as directed. Compression Wrap Compression Stockings Add-Ons Electronic Signature(s) Signed: 09/01/2020 5:24:49 PM By: Dawkins, Destiny Signed: 09/02/2020 6:06:42 PM By: Boehlein, Linda RN, BSN Entered   By: Dawkins, Destiny on 09/01/2020 16:02:39 -------------------------------------------------------------------------------- Vitals Details Patient Name: Date of Service: [name] 09/01/2020 8:00 Max M Medical Record Number: [id number] Patient Account Number: [id number] Date of Birth/Sex: Treating RN: 05/20/1985 (34 y.o. M) [name] Primary Care Provider: [name], [name] Other Clinician: Referring Provider: Treating Provider/Extender: [name] [name] Weeks in Treatment: 44 Vital Signs Time Taken: 08:34 Temperature (°F): 98.8 Height (in): 77 Pulse (bpm): 94 Source: Stated Respiratory Rate (breaths/min): 18 Weight (lbs): 280 Blood Pressure (mmHg): 138/86 Source: Stated Capillary Blood Glucose (mg/dl): 90 Body Mass Index (BMI): 33.2 Reference Range: 80 - 120 mg / dl Notes glucose per pt report this  am Electronic Signature(s) Signed: 09/02/2020 6:06:42 PM By: Boehlein, Linda RN, BSN Entered By: [name] on 09/01/2020 08:34:50 

## 2020-09-07 ENCOUNTER — Encounter (HOSPITAL_BASED_OUTPATIENT_CLINIC_OR_DEPARTMENT_OTHER): Payer: BC Managed Care – PPO | Attending: Internal Medicine | Admitting: Internal Medicine

## 2020-09-07 ENCOUNTER — Other Ambulatory Visit: Payer: Self-pay

## 2020-09-07 DIAGNOSIS — L97512 Non-pressure chronic ulcer of other part of right foot with fat layer exposed: Secondary | ICD-10-CM | POA: Diagnosis not present

## 2020-09-07 DIAGNOSIS — L97522 Non-pressure chronic ulcer of other part of left foot with fat layer exposed: Secondary | ICD-10-CM | POA: Diagnosis not present

## 2020-09-07 DIAGNOSIS — Z8616 Personal history of COVID-19: Secondary | ICD-10-CM | POA: Insufficient documentation

## 2020-09-07 DIAGNOSIS — E11621 Type 2 diabetes mellitus with foot ulcer: Secondary | ICD-10-CM | POA: Diagnosis not present

## 2020-09-07 DIAGNOSIS — I890 Lymphedema, not elsewhere classified: Secondary | ICD-10-CM | POA: Diagnosis not present

## 2020-09-07 DIAGNOSIS — L97312 Non-pressure chronic ulcer of right ankle with fat layer exposed: Secondary | ICD-10-CM | POA: Diagnosis not present

## 2020-09-09 NOTE — Progress Notes (Signed)
[name] ([id number]) Visit Report for 09/07/2020 Debridement Details Patient Name: Date of Service: [name] 09/07/2020 12:30 PM Medical Record Number: [id number] Patient Account Number: [id number] Date of Birth/Sex: Treating RN: 1985-05-17 (35 y.o. [name] Primary Care Provider: Seward [name] Other Clinician: Referring Provider: Treating Provider/Extender: [name] in Treatment: 20 Debridement Performed for Assessment: Wound #5 Right,Lateral Metatarsal head fifth Performed By: Physician [name]., MD Debridement Type: Debridement Severity of Tissue Pre Debridement: Fat layer exposed Level of Consciousness (Pre-procedure): Awake and Alert Pre-procedure Verification/Time Out Yes - 13:10 Taken: Start Time: 13:10 T Area Debrided (L x W): otal 2.4 (cm) x 3.1 (cm) = 7.44 (cm) Tissue and other material debrided: Viable, Non-Viable, Skin: Epidermis, Fibrin/Exudate Level: Skin/Epidermis Debridement Description: Selective/Open Wound Instrument: Curette Bleeding: Moderate Hemostasis Achieved: Silver Nitrate End Time: 13:11 Procedural Pain: 0 Post Procedural Pain: 0 Response to Treatment: Procedure was tolerated well Level of Consciousness (Post- Awake and Alert procedure): Post Debridement Measurements of Total Wound Length: (cm) 2.4 Width: (cm) 3.1 Depth: (cm) 0.5 Volume: (cm) 2.922 Character of Wound/Ulcer Post Debridement: Stable Severity of Tissue Post Debridement: Fat layer exposed Post Procedure Diagnosis Same as Pre-procedure Electronic Signature(s) Signed: 09/07/2020 4:50:54 PM By: Max Ham MD Signed: 09/09/2020 5:57:05 PM By: Max Hurst RN, BSN Entered By: [name] on 09/07/2020 13:27:49 -------------------------------------------------------------------------------- HPI Details Patient Name: Date of Service: [name], [name] 09/07/2020 12:30 PM Medical Record Number: [id number] Patient  Account Number: [id number] Date of Birth/Sex: Treating RN: September 06, 1985 (35 y.o. 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 He comes in for review of a wound over the plantar fifth metatarsal head extending into the lateral part of the foot. He was followed for this previously by his podiatrist Dr. Cornelius [name]. As the patient tells his story he went to see podiatry first for a swelling he developed on the lateral part of his fifth metatarsal head in May. He states this was "open" by podiatry and the area closed. He was followed up in June and it was again opened callus removed and it closed promptly. There were plans being made for surgery on the fifth metatarsal head in June however his blood sugar was apparently too high for anesthesia. Apparently the area was debrided and opened again in June and it is never closed since. Looking over the records from podiatry I am really not able to follow this. It was clear when he was first seen it was before 5/14 at that point he already had a wound. By 5/17 the ulcer was resolved. I do not see anything about a procedure. On 5/28 noted to have pre-ulcerative moderate keratosis. X-ray noted 1/5 contracted toe and tailor's bunion and metatarsal deformity. On a visit date on 09/28/2018 the dorsal part of the left foot it healed and resolved. There was concern about swelling in his lower extremity he was sent to the ER.. As far as I can tell he was seen in the ER on 7/12 with an ulcer on his left foot. A DVT rule out of the left leg was negative. I do not think I have complete records from podiatry but I am not able to verify the procedures this patient states he had. He states after the last  procedure the wound has never closed although I am not able to follow this in the records I have from podiatry. He has not had a recent x-ray The patient has been using Neosporin on the wound. He is wearing a Darco shoe. He is still very active up on his foot working and exercising. Past medical history; type 2 diabetes ketosis-prone, leg swelling with a negative DVT study in July. Non-smoker ABI in our clinic was 0.85 on the left 10/16; substantial wound on the plantar left fifth met head extending laterally almost to the dorsal fifth MTP. We have been using silver alginate we gave him a Darco forefoot off loader. An x-ray did not show evidence of osteomyelitis did note soft tissue emphysema which I think was due to gas tracking through an open wound. There is no doubt in my mind he requires an MRI 10/23; MRI not booked until 3 November at the earliest this is largely due to his glucose sensor in the right arm. We have been using silver alginate. There has been an improvement 10/29; I am still not exactly sure when his MRI is booked for. He says it is the third but it is the 10th in epic. This definitely needs to be done. He is running a low-grade fever today but no other symptoms. No real improvement in the 1 02/26/2019 patient presents today for a follow-up visit here in our clinic he is last been seen in the clinic on October 29. Subsequently we were working on getting MRI to evaluate and see what exactly was going on and where we would need to go from the standpoint of whether or not he had osteomyelitis and again what treatments were going be required. Subsequently the patient ended up being admitted to the hospital on 02/07/2019 and was discharged on 02/14/2019. This is a somewhat interesting admission with a discharge diagnosis of pneumonia due to COVID-19 although he was positive for COVID-19 when tested at the urgent care but negative x2 when he was actually in the hospital. With that being  said he did have acute respiratory failure with hypoxia and it was noted he also have a left foot ulceration with osteomyelitis. With that being said he did require oxygen for his pneumonia and I level 4 L. He was placed on antivirals and steroids for the COVID-19. He was also transferred to the [facility] at one point. Nonetheless he did subsequently discharged home and since being home has done much better in that regard. The CT angiogram did not show any pulmonary embolism. With regard to the osteomyelitis the patient was placed on vancomycin and Zosyn while in the hospital but has been changed to Augmentin at discharge. It was also recommended that he follow- up with wound care and podiatry. Podiatry however wanted him to see [name] according to the patient prior to them doing anything further. His hemoglobin A1c was 9.9 as noted in the hospital. Have an MRI of the left foot performed while in the hospital on 02/04/2019. 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 There is an overlying plantar open wound noted an abscess tracking back along the lateral aspect of the fifth metatarsal shaft. There is otherwise diffuse cellulitis and mild fasciitis without findings of polymyositis. The patient did have recently pneumonia secondary to COVID-19 I looked in the chart through epic and it does appear that the patient may need to have an  additional x-ray just to ensure everything is cleared and that he has no airspace disease prior to putting him into the [name]. 03/05/2019; patient was readmitted to the clinic last week. He was hospitalized twice for a viral upper respiratory tract infection from 11/1 through 11/4 and then 11/5 through 11/12 ultimately this turned out to be Covid pneumonitis. Although he was discharged on oxygen he is not using it. He says he feels fine. He has no exercise limitation no cough  no sputum. His O2 sat in our clinic today was 100% on room air. 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 He received Vanco and Zosyn in the hospital and then was discharged on 2 weeks of Augmentin. I do not see any relevant cultures. He was supposed to follow-up with infectious disease but I do not see that he has an appointment. 12/8; patient saw Dr. Novella Olive of infectious disease last week. He felt that he had had adequate antibiotic therapy. He did not go to follow-up with Dr. Amalia Hailey of podiatry and I have again talked to him about the pros and cons of this. He does not want to consider a ray amputation of this time. He is aware of the risks of recurrence, migration etc. He started HBO today and tolerated this well. He can complete the Augmentin that I gave him last week. I have looked over the lab work that Dr. Chana Bode ordered his C-reactive protein was 3.3 and his sedimentation rate was 17. The C-reactive protein is never really been measurably that high in this patient 12/15; not much change in the wound today however he has undermining along the lateral part of the foot again more extensively than last week. He has some rims of epithelialization. We have been using silver alginate. He is undergoing hyperbarics but did not dive today 12/18; in for his obligatory first total contact cast change. Unfortunately there was pus coming from the undermining area around his fifth metatarsal head. This was cultured but will preclude reapplication of a cast. He is seen in conjunction with HBO 12/24; patient had staph lugdunensis in the wound in the undermining area laterally last time. We put him on doxycycline which should have covered this. The wound looks better today. I am going to give him another week of doxycycline before reattempting the total contact cast 12/31; the patient is  completing antibiotics. Hemorrhagic debris in the distal part of the wound with some undermining distally. He also had hyper granulation. Extensive debridement with a #5 curette. The infected area that was on the lateral part of the fifth met head is closed over. I do not think he needs any more antibiotics. Patient was seen prior to HBO. Preparations for a total contact cast were made in the cast will be placed post hyperbarics 04/11/19; once again the patient arrives today without complaint. He had been in a cast all week noted that he had heavy drainage this week. This resulted in large raised areas of macerated tissue around the wound 1/14; wound bed looks better slightly smaller. Hydrofera Blue has been changing himself. He had a heavy drainage last week which caused a lot of maceration around the wound so I took him out of a total contact cast he says the drainage is actually better this week He is seen today in conjunction with HBO 1/21; returns to clinic. He was up in [place] for  a day or 2 attending a funeral. He comes back in with the wound larger and with a large area of exposed bone. He had osteomyelitis and septic arthritis of the fifth left metatarsal head while he was in hospital. He received IV antibiotics in the hospital for a prolonged period of time then 3 weeks of Augmentin. Subsequently I gave him 2 weeks of doxycycline for more superficial wound infection. When I saw this last week the wound was smaller the surface of the wound looks satisfactory. 1/28; patient missed hyperbarics today. Bone biopsy I did last time showed Enterococcus faecalis and Staphylococcus lugdunensis . He has a wide area of exposed bone. We are going to use silver alginate as of today. I had another ethical discussion with the patient. 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 In this situation I think the likelihood of healing this is low. Therefore I have recommended a  ray amputation and with the patient's agreement I have referred him to Dr. Doran Durand. The other issue is that his compliance with hyperbarics has been minimal because of his work schedule and given his underlying decision I am going to stop this today READMISSION 10/24/2019 MRI 09/29/2019 left foot IMPRESSION: 1. Apparent skin ulceration inferior and lateral to the 5th metatarsal base with underlying heterogeneous T2 signal and enhancement in the subcutaneous fat. Small peripherally enhancing fluid collections along the plantar and lateral aspects of the 5th metatarsal base suspicious for abscesses. 2. Interval amputation through the mid 5th metatarsal with nonspecific low-level marrow edema and enhancement. Given the proximity to the adjacent soft tissue inflammatory changes, osteomyelitis cannot be excluded. 3. The additional bones appear unremarkable. MRI 09/29/2019 right foot IMPRESSION: 1. Soft tissue ulceration lateral to the 5th MTP joint. There is low-level T2 hyperintensity within the 4th and 5th metatarsal heads and adjacent proximal phalanges without abnormal T1 signal or cortical destruction. These findings are nonspecific and could be seen with early marrow edema, hyperemia or early osteomyelitis. No evidence of septic joint. 2. Mild tenosynovitis and synovial enhancement associated with the extensor digitorum tendons at the level of the midfoot. 3. Diffuse low-level muscular T2 hyperintensity and enhancement, most consistent with diabetic myopathy. LEFT FOOT BONE Methicillin resistant staphylococcus aureus Staphylococcus lugdunensis MIC MIC CIPROFLOXACIN >=8 RESISTANT Resistant <=0.5 SENSI... Sensitive CLINDAMYCIN <=0.25 SENS... Sensitive >=8 RESISTANT Resistant ERYTHROMYCIN >=8 RESISTANT Resistant >=8 RESISTANT Resistant GENTAMICIN <=0.5 SENSI... Sensitive <=0.5 SENSI... Sensitive Inducible Clindamycin NEGATIVE Sensitive NEGATIVE Sensitive OXACILLIN >=4 RESISTANT  Resistant 2 SENSITIVE Sensitive RIFAMPIN <=0.5 SENSI... Sensitive <=0.5 SENSI... Sensitive TETRACYCLINE <=1 SENSITIVE Sensitive <=1 SENSITIVE Sensitive TRIMETH/SULFA <=10 SENSIT Sensitive <=10 SENSIT Sensitive ... Marland Kitchen.. VANCOMYCIN 1 SENSITIVE Sensitive <=0.5 SENSI... Sensitive Right foot bone . Component 3 wk ago Specimen Description BONE Special Requests RIGHT 4 METATARSAL SAMPLE B Gram Stain NO WBC SEEN NO ORGANISMS SEEN Culture RARE METHICILLIN RESISTANT STAPHYLOCOCCUS AUREUS NO ANAEROBES ISOLATED Performed at [facility], [place] [address], [place], [place] [address] Report Status 10/08/2019 FINAL Organism ID, Bacteria METHICILLIN RESISTANT STAPHYLOCOCCUS AUREUS Resulting Agency [facility] Susceptibility Methicillin resistant staphylococcus aureus MIC CIPROFLOXACIN >=8 RESISTANT Resistant CLINDAMYCIN <=0.25 SENS... Sensitive ERYTHROMYCIN >=8 RESISTANT Resistant GENTAMICIN <=0.5 SENSI... Sensitive Inducible Clindamycin NEGATIVE Sensitive OXACILLIN >=4 RESISTANT Resistant RIFAMPIN <=0.5 SENSI... Sensitive TETRACYCLINE <=1 SENSITIVE Sensitive TRIMETH/SULFA <=10 SENSIT Sensitive ... VANCOMYCIN 1 SENSITIVE Sensitive This is a patient we had in clinic earlier this year with a wound over his left fifth metatarsal head. 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 Doran Durand on 05/16/2019. He seems to have developed problems with wounds on his bilateral feet in June 2021 although he may have had problems earlier than this. He was in an urgent care with a right foot ulcer on 09/26/2019 and given a course of doxycycline. This was apparently after having trouble getting into see orthopedics. He was seen by  podiatry on 09/28/2019 noted to have bilateral lower extremity ulcers including the left lateral fifth metatarsal base and the right subfifth met head. It was noted that had purulent drainage at that time. He required hospitalization from 6/20 through 7/2. This was because of worsening right foot wounds. He underwent bilateral operative incision and drainage and bone biopsies bilaterally. Culture results are listed above. He has been referred back to clinic by Dr. Jacqualyn Posey of podiatry. He is also followed by Dr. Megan Salon who saw him yesterday. He was discharged from hospital on Zyvox Flagyl and Levaquin and yesterday changed to doxycycline Flagyl and Levaquin. His inflammatory markers on 6/26 showed a sedimentation rate of 129 and a C-reactive protein of 5. This is improved to 14 and 1.3 respectively. This would indicate improvement. ABIs in our clinic today were 1.23 on the right and 1.20 on the left 11/01/2019 on evaluation today patient appears to be doing fairly well in regard to the wounds on his feet at this point. Fortunately there is no signs of active infection at this time. No fevers, chills, nausea, vomiting, or diarrhea. He currently is seeing infectious disease and still under their care at this point. Subsequently he also has both wounds which she has not been using collagen on as he did not receive that in his packaging he did not call us and let us know that. Apparently that just was missed on the order. Nonetheless we will get that straightened out today. 8/9-Patient returns for bilateral foot wounds, using Prisma with hydrogel moistened dressings, and the wounds appear stable. Patient using surgical shoes, avoiding much pressure or weightbearing as much as possible 8/16; patient has bilateral foot wounds. 1 on the right lateral foot proximally the other is on the left mid lateral foot. Both required debridement of callus and thick skin around the wounds. We have been using silver  collagen 8/27; patient has bilateral lateral foot wounds. The area on the left substantially surrounded by callus and dry skin. This was removed from the wound edge. The underlying wound is small. The area on the right measured somewhat smaller today. 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 Unfortunately I did not update his antibiotics during today's visit. 9/10 I reviewed Dr. Hale Bogus last notes he felt he had completed antibiotics his inflammatory markers were reasonably well controlled. He has a small wound on the lateral left foot and a tiny area on the right which is just above closed. He is using Hydrofera Blue with border foam he has bilateral surgical shoes 9/24; 2 week f/u. doing well. right foot is closed. left foot still undermined. 10/14; right foot remains closed at the fifth met head. The area over the base of the left fifth metatarsal has a small open area but considerable undermining towards the plantar foot. Thick callus skin around this suggests  an adequate pressure relief. We have talked about this. He says he is going to go back into his cam boot. I suggested a total contact cast he did not seem enamored with this suggestion 10/26; left foot base of the fifth metatarsal. Same condition as last time. He has skin over the area with an open wound however the skin is not adherent. He went to see Dr. Earleen Newport who did an x-ray and culture of his foot I have not reviewed the x-ray but the patient was not told anything. He is on doxycycline 11/11; since the patient was last here he was in the emergency room on 10/30 he was concerned about swelling in the left foot. They did not do any cultures or x-rays. They changed his antibiotics to cephalexin. Previous culture showed group B strep. The cephalexin is appropriate as doxycycline has less than predictable coverage. Arrives in clinic today with swelling over this area  under the wound. He also has a new wound on the right fifth metatarsal head 11/18; the patient has a difficult wound on the lateral aspect of the left fifth metatarsal head. The wound was almost ballotable last week I opened it slightly expecting to see purulence however there was just bleeding. I cultured this this was negative. X-ray unchanged. We are trying to get an MRI but I am not sure were going to be able to get this through his insurance. He also has an area on the right lateral fifth metatarsal head this looks healthier 12/3; the patient finally got our MRI. Surprisingly this did not show osteomyelitis. I did show the soft tissue ulceration at the lateral plantar aspect of the fifth metatarsal base with a tiny residual 6 mm abscess overlying the superficial fascia I have tried to culture this area I have not been able to get this to grow anything. Nevertheless the protruding tissue looks aggravated. I suspect we should try to treat the underlying "abscess with broad-spectrum antibiotics. I am going to start him on Levaquin and Flagyl. He has much less edema in his legs and I am going to continue to wrap his legs and see him weekly 12/10. I started Levaquin and Flagyl on him last week. He just picked up the Flagyl apparently there was some delay. The worry is the wound on the left fifth metatarsal base which is substantial and worsening. His foot looks like he inverts at the ankle making this a weightbearing surface. Certainly no improvement in fact I think the measurements of this are somewhat worse. We have been using 12/17; he apparently just got the Levaquin yesterday this is 2 weeks after the fact. He has completed the Flagyl. The area over the left fifth metatarsal base still has protruding granulation tissue although it does not look quite as bad as it did some weeks ago. He has severe bilateral lymphedema although we have not been treating him for wounds on his legs this is definitely  going to require compression. There was so much edema in the left I did not wish to put him in a total contact cast today. I am going to increase his compression from 3-4 layer. The area on the right lateral fifth met head actually look quite good and superficial. 12/23; patient arrived with callus on the right fifth met head and the substantial hyper granulated callused wound on the base of his fifth metatarsal. He says he is completing his Levaquin in 2 days but I do not think that adds up with  what I gave him but I will have to double check this. We are using Hydrofera Blue on both areas. My plan is to put the left leg in a cast the week after New Year's 04/06/2020; patient's wounds about the same. Right lateral fifth metatarsal head and left lateral foot over the base of the fifth metatarsal. There is undermining on the left lateral foot which I removed before application of total contact cast continuing with Hydrofera Blue new. Patient tells me he was seen by endocrinology today lab work was done [Dr. Kerr]. Also wondering whether he was referred to cardiology. I went over some lab work from previously does not have chronic renal failure certainly not nephrotic range proteinuria he does have very poorly controlled diabetes but this is not his most updated lab work. Hemoglobin A1c has been over 11 1/10; the patient had a considerable amount of leakage towards mid part of his left foot with macerated skin however the wound surface looks better the area on the right lateral fifth met head is better as well. I am going to change the dressing on the left foot under the total contact cast to silver alginate, continue with Hydrofera Blue on the right. 1/20; patient was in the total contact cast for 10 days. Considerable amount of drainage although the skin around the wound does not look too bad on the left foot. The area on the right fifth metatarsal head is closed. Our nursing staff reports large amount  of drainage out of the left lateral foot wound 1/25; continues with copious amounts of drainage described by our intake staff. PCR culture I did last week showed E. coli and Enterococcus faecalis and low quantities. Multiple resistance genes documented including extended spectrum beta lactamase, MRSA, MRSE, quinolone, tetracycline. The wound is not quite as good this week as it was 5 days ago but about the same size 2/3; continues with copious amounts of malodorous drainage per our intake nurse. The PCR culture I did 2 weeks ago showed E. coli and low quantities of Enterococcus. There were multiple resistance genes detected. I put Neosporin on him last week although this does not seem to have helped. The wound is slightly deeper today. Offloading continues to be an issue here although with the amount of drainage she has a total contact cast is just not going to work 2/10; moderate amount of drainage. Patient reports he cannot get his stocking on over the dressing. I told him we have to do that the nurse gave him suggestions on how to make this work. The wound is on the bottom and lateral part of his left foot. Is cultured predominantly grew low amounts of Enterococcus, E. coli and anaerobes. There were multiple resistance genes detected including extended spectrum beta lactamase, quinolone, tetracycline. I could not think of an easy oral combination to address this so for now I am going to do topical antibiotics provided by [facility] I think the main agents here are vancomycin and an aminoglycoside. We have to be able to give him access to the wounds to get the topical antibiotic on 2/17; moderate amount of drainage this is unchanged. He has his Keystone topical antibiotic against the deep tissue culture organisms. He has been using this and changing the dressing daily. Silver alginate on the wound surface. 2/24; using Keystone antibiotic with silver alginate on the top. He had too much drainage for a  total contact cast at one point although I think that is improving and I think  in the next week or 2 it might be possible to replace a total contact cast I did not do this today. In general the wound surface looks healthy however he continues to have thick rims of skin and subcutaneous tissue around the wide area of the circumference which I debrided 06/04/2020 upon evaluation today patient appears to be doing well in regard to his wound. I do feel like he is showing signs of improvement. There is little bit of callus and dead tissue around the edges of the wound as well as what appears to be a little bit of a sinus tract that is off to the side laterally I would perform debridement to clear that away today. 3/17; left lateral foot. The wound looks about the same as I remember. Not much depth surface looks healthy. No evidence of infection 3/25; left lateral foot. Wound surface looks about the same. Separating epithelium from the circumference. There really is no evidence of infection here however not making progress by my view 3/29; left lateral foot. Surface of the wound again looks reasonably healthy still thick skin and subcutaneous tissue around the wound margins. There is no evidence of infection. One of the concerns being brought up by the nurses has again the amount of drainage vis--vis continued use of a total contact cast 4/5; left lateral foot at roughly the base of the fifth metatarsal. Nice healthy looking granulated tissue with rims of epithelialization. The overall wound measurements are not any better but the tissue looks healthy. The only concern is the amount of drainage although he has no surrounding maceration with what we have been doing recently to absorb fluid and protect his skin. He also has lymphedema. 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 He states that the kerlix/coban under the TCC rolled down and could not get it back up. He has been using an offloading scooter and has somehow hurt his right foot using this device. This happened last week. He states that the side of his right foot developed a blister and opened. The top of his foot also has a few small open wounds he thinks is due to his socks rubbing in his shoes. He has not been using any dressings to the wound. He denies purulent drainage, fever/chills or erythema to the wounds. 4/22; patient presents for 1 week follow-up. He developed new wounds to the right foot that were evaluated at last clinic visit. He continues to have a total contact cast to the left leg and he reports no issues. He has been using silver collagen to the right foot wounds with no issues. He denies purulent drainage, fever/chills or erythema to the right foot wounds. He has no complaints today 4/25; patient presents for 1 week follow-up. He has a total contact cast of the left leg and reports no issues. He has been using silver alginate to the right foot wound. He denies purulent drainage, fever/chills or erythema to the right foot wounds. 5/2 patient presents for 1 week follow-up. T contact cast on the left. The wound which is on the base of the plantar foot at the base of the fifth metatarsal otal actually looks quite good and dimensions continue to gradually contract. HOWEVER the area  on the right lateral fifth metatarsal head is much larger than what I remember from 2 weeks ago. Once more is he has significant levels of hypergranulation. Noteworthy that he had this same hyper granulated response on his wound on the left foot at one point in time. So much so that he I thought there was an underlying fluid collection. Based on this I think this just needs debridement. 5/9; the wound on the  left actually continues to be gradually smaller with a healthy surface. Slight amount of drainage and maceration of the skin around but not too bad. However he has a large wound over the right fifth metatarsal head very much in the same configuration as his left foot wound was initially. I used silver nitrate to address the hyper granulated tissue no mechanical debridement 5/16; area on the left foot did not look as healthy this week deeper thick surrounding macerated skin and subcutaneous tissue. The area on the right foot fifth met head was about the same The area on the right ankle that we identified last week is completely broken down into an open wound presumably a stocking rubbing issue 5/23; patient has been using a total contact cast to the left side. He has been using silver alginate underneath. He has also been using silver alginate to the right foot wounds. He has no complaints today. He denies any signs of infection. 5/31; the left-sided wound looks some better measure smaller surface granulation looks better. We have been using silver alginate under the total contact cast The large area on his right fifth met head and right dorsal foot look about the same still using silver alginate 6/6; neither side is good as I was hoping although the surface area dimensions are better. A lot of maceration on his left and right foot around the wound edge. Area on the dorsal right foot looks better. He says he was traveling. I am not sure what does the amount of maceration around the plantar wounds may be drainage issues Electronic Signature(s) Signed: 09/07/2020 4:50:54 PM By: Max Ham MD Entered By: [name] on 09/07/2020 13:23:36 -------------------------------------------------------------------------------- Physical Exam Details Patient Name: Date of Service: [name], [name] 09/07/2020 12:30 PM Medical Record Number: [id number] Patient Account Number: [id number] Date of Birth/Sex:  Treating RN: 1985-10-08 (35 y.o. [name] Primary Care Provider: Seward [name] Other Clinician: Referring Provider: Treating Provider/Extender: [name] in Treatment: 82 Constitutional Sitting or standing Blood Pressure is within target range for patient.. Pulse regular and within target range for patient.Marland Kitchen Respirations regular, non-labored and within target range.. Temperature is normal and within the target range for the patient.Marland Kitchen Appears in no distress. Cardiovascular Pedal pulses palpable and strong bilaterally.. Notes Wound exam; left lateral foot. A lot of surrounding maceration here. I suspect this is drainage from the wound as it is isolated. Same issue on the right removed callus and skin from the edge of this wound which is really built up. The dorsal right foot wound actually looks a little better. Still the total contact cast on the left Electronic Signature(s) Signed: 09/07/2020 4:50:54 PM By: Max Ham MD Entered By: [name] on 09/07/2020 01:60:10 -------------------------------------------------------------------------------- Physician Orders Details Patient Name: Date of Service: [name], [name] 09/07/2020 12:30 PM Medical Record Number: [id number] Patient Account Number: [id number] Date of Birth/Sex: Treating RN: November 07, 1985 (35 y.o. 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 - ***60 minutes for cast*** - with Dr. Dellia Nims Return A Bathing/ Shower/ Hygiene May shower with protection but do not get wound dressing(s) wet. - use a cast protector to left leg. Edema Control - Lymphedema / SCD / Other Bilateral Lower Extremities Elevate legs to the level of the heart or above for 30 minutes daily and/or when sitting, a frequency of: - throughout the day Avoid standing for long periods of time. Exercise regularly Moisturize legs daily. - right leg every night before bed. Compression stocking or Garment 20-30 mm/Hg pressure to: - Apply to right leg in the morning and remove at night. Off-Loading Total Contact Cast to Left Lower Extremity Open toe surgical shoe to: - right foot Wound Treatment Wound #3 - Foot Wound Laterality: Left, Lateral Cleanser: Soap and Water 1 x Per Week/30 Days Discharge Instructions: May shower and wash wound with dial antibacterial soap and water prior to dressing change. Peri-Wound Care: Zinc Oxide Ointment 30g tube 1 x Per Week/30 Days Discharge Instructions: Apply Zinc Oxide to periwound with each dressing change as needed. Prim Dressing: KerraCel Ag Gelling Fiber Dressing, 2x2 in (silver alginate) 1 x Per Week/30 Days ary Discharge Instructions: Apply silver alginate to wound bed as instructed Secondary Dressing: Woven Gauze Sponge, Non-Sterile 4x4 in 1 x Per Week/30 Days Discharge Instructions: Apply over primary dressing as directed. Secondary Dressing: ABD Pad, 8x10 1 x Per Week/30 Days Discharge Instructions: T down before applying kerlix. Apply over primary dressing as directed. ape Secondary Dressing: Zetuvit Plus 4x8 in 1 x Per Week/30 Days Discharge Instructions: Apply over primary dressing as directed. Secured With: [employer], 4.5x3.1 (in/yd) 1 x Per Week/30 Days Discharge Instructions: Secure with Kerlix as directed. Wound #5 - Metatarsal head fifth Wound Laterality: Right,  Lateral Cleanser: Normal Saline (DME) (Generic) Every Other Day/15 Days Discharge Instructions: Cleanse the wound with Normal Saline prior to applying a clean dressing using gauze sponges, not tissue or cotton balls. Cleanser: Soap and Water Every Other Day/15 Days Discharge Instructions: May shower and wash wound with dial antibacterial soap and water prior to dressing change. Prim Dressing: KerraCel Ag Gelling Fiber Dressing, 2x2 in (silver alginate) (DME) (Generic) Every Other Day/15 Days ary Discharge Instructions: Apply silver alginate to wound bed as instructed Secondary Dressing: ABD Pad, 8x10 (DME) (Generic) Every Other Day/15 Days Discharge Instructions: Apply over primary dressing as directed. Secondary Dressing: Optifoam Non-Adhesive Dressing, 4x4 in (DME) (Generic) Every Other Day/15 Days Discharge Instructions: Apply over primary dressing, cut to make foam donut to offload Secured With: Kerlix Roll Sterile, 4.5x3.1 (in/yd) (DME) (Generic) Every Other Day/15 Days Discharge Instructions: Secure with Kerlix as directed. Secured With: 41M Medipore H Soft Cloth Surgical Tape, 2x2 (in/yd) (DME) (Generic) Every Other Day/15 Days Discharge Instructions: Secure dressing with tape as directed. Wound #7 - Ankle Wound Laterality: Right, Anterior Cleanser: Normal Saline (DME) (Generic) Every Other Day/15 Days Discharge Instructions: Cleanse the wound with Normal Saline prior to applying a clean dressing using gauze sponges, not tissue or cotton balls. Cleanser: Soap and Water Every Other Day/15 Days Discharge Instructions: May shower and wash wound  with dial antibacterial soap and water prior to dressing change. Prim Dressing: KerraCel Ag Gelling Fiber Dressing, 2x2 in (silver alginate) (DME) (Generic) Every Other Day/15 Days ary Discharge Instructions: Apply silver alginate to wound bed as instructed Secondary Dressing: Woven Gauze Sponge, Non-Sterile 4x4 in (DME) (Generic) Every Other Day/15  Days Discharge Instructions: Apply over primary dressing as directed. Secondary Dressing: ABD Pad, 8x10 (DME) (Generic) Every Other Day/15 Days Discharge Instructions: Apply over primary dressing as directed. Secured With: [employer], 4.5x3.1 (in/yd) (DME) (Generic) Every Other Day/15 Days Discharge Instructions: Secure with Kerlix as directed. Secured With: 83M Medipore H Soft Cloth Surgical Tape, 2x2 (in/yd) (DME) (Generic) Every Other Day/15 Days Discharge Instructions: Secure dressing with tape as directed. Electronic Signature(s) Signed: 09/07/2020 4:50:54 PM By: Max Ham MD Signed: 09/09/2020 5:57:05 PM By: Max Hurst RN, BSN Entered By: [name] on 09/07/2020 14:12:36 -------------------------------------------------------------------------------- Problem List Details Patient Name: Date of Service: [name], [name] 09/07/2020 12:30 PM Medical Record Number: [id number] Patient Account Number: [id number] Date of Birth/Sex: Treating RN: 10-01-1985 (35 y.o. 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 08/10/2020 No Yes Inactive Problems ICD-10 Code Description Active Date Inactive Date L97.518 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ity 10/24/2019 10/24/2019 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, right ankle and foot 10/24/2019 10/24/2019 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, left ankle and foot 10/24/2019 10/24/2019 B95.62 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 10/24/2019 10/24/2019 classified elsewhere Resolved Problems Electronic Signature(s) Signed: 09/07/2020 4:50:54 PM By: Max Ham MD Entered By: [name] on 09/07/2020 13:20:52 -------------------------------------------------------------------------------- Progress Note Details Patient Name: Date of Service: [name], [name] 09/07/2020 12:30 PM Medical Record Number: [id number] Patient Account Number: [id number] Date of Birth/Sex: Treating RN: 10/18/1985 (35 y.o. 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 He comes in for review of a wound over the plantar fifth metatarsal head extending into the lateral part of the foot. He was followed for this previously by his podiatrist Dr. Cornelius [name]. As the patient tells his story he went to see podiatry first for a swelling he developed on the lateral part of his fifth metatarsal head in May. He states this was "open" by podiatry and the area closed. He was followed up in June and it was again opened callus removed and it closed promptly. There were plans being made for surgery on the fifth metatarsal head in June however his blood sugar was apparently too high for anesthesia. Apparently the area was debrided and opened again in June and it is never closed since. Looking over the records from podiatry I am really not able to follow this. It was clear when he was first seen it was before 5/14 at that point he already had a wound. By 5/17 the  ulcer was resolved. I do not see anything about a procedure. On 5/28 noted to  have pre-ulcerative moderate keratosis. X-ray noted 1/5 contracted toe and tailor's bunion and metatarsal deformity. On a visit date on 09/28/2018 the dorsal part of the left foot it healed and resolved. There was concern about swelling in his lower extremity he was sent to the ER.. As far as I can tell he was seen in the ER on 7/12 with an ulcer on his left foot. A DVT rule out of the left leg was negative. I do not think I have complete records from podiatry but I am not able to verify the procedures this patient states he had. He states after the last procedure the wound has never closed although I am not able to follow this in the records I have from podiatry. He has not had a recent x-ray The patient has been using Neosporin on the wound. He is wearing a Darco shoe. He is still very active up on his foot working and exercising. Past medical history; type 2 diabetes ketosis-prone, leg swelling with a negative DVT study in July. Non-smoker ABI in our clinic was 0.85 on the left 10/16; substantial wound on the plantar left fifth met head extending laterally almost to the dorsal fifth MTP. We have been using silver alginate we gave him a Darco forefoot off loader. An x-ray did not show evidence of osteomyelitis did note soft tissue emphysema which I think was due to gas tracking through an open wound. There is no doubt in my mind he requires an MRI 10/23; MRI not booked until 3 November at the earliest this is largely due to his glucose sensor in the right arm. We have been using silver alginate. There has been an improvement 10/29; I am still not exactly sure when his MRI is booked for. He says it is the third but it is the 10th in epic. This definitely needs to be done. He is running a low-grade fever today but no other symptoms. No real improvement in the 1 02/26/2019 patient presents today for a follow-up visit here in our clinic he is last been seen in the clinic on October 29. Subsequently we  were working on getting MRI to evaluate and see what exactly was going on and where we would need to go from the standpoint of whether or not he had osteomyelitis and again what treatments were going be required. Subsequently the patient ended up being admitted to the hospital on 02/07/2019 and was discharged on 02/14/2019. This is a somewhat interesting admission with a discharge diagnosis of pneumonia due to COVID-19 although he was positive for COVID-19 when tested at the urgent care but negative x2 when he was actually in the hospital. With that being said he did have acute respiratory failure with hypoxia and it was noted he also have a left foot ulceration with osteomyelitis. With that being said he did require oxygen for his pneumonia and I level 4 L. He was placed on antivirals and steroids for the COVID-19. He was also transferred to the [facility] at one point. Nonetheless he did subsequently discharged home and since being home has done much better in that regard. The CT angiogram did not show any pulmonary embolism. With regard to the osteomyelitis the patient was placed on vancomycin and Zosyn while in the hospital but has been changed to Augmentin at discharge. It was also recommended that he follow- up with wound care and podiatry.  Podiatry however wanted him to see [name] according to the patient prior to them doing anything further. His hemoglobin A1c was 9.9 as noted in the hospital. Have an MRI of the left foot performed while in the hospital on 02/04/2019. 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 There is an overlying plantar open wound noted an abscess tracking back along the lateral aspect of the fifth metatarsal shaft. There is otherwise diffuse cellulitis and mild fasciitis without findings of polymyositis. The patient did have recently pneumonia secondary to COVID-19 I looked in  the chart through epic and it does appear that the patient may need to have an additional x-ray just to ensure everything is cleared and that he has no airspace disease prior to putting him into the [name]. 03/05/2019; patient was readmitted to the clinic last week. He was hospitalized twice for a viral upper respiratory tract infection from 11/1 through 11/4 and then 11/5 through 11/12 ultimately this turned out to be Covid pneumonitis. Although he was discharged on oxygen he is not using it. He says he feels fine. He has no exercise limitation no cough no sputum. His O2 sat in our clinic today was 100% on room air. 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 He received Vanco and Zosyn in the hospital and then was discharged on 2 weeks of Augmentin. I do not see any relevant cultures. He was supposed to follow-up with infectious disease but I do not see that he has an appointment. 12/8; patient saw Dr. Novella Olive of infectious disease last week. He felt that he had had adequate antibiotic therapy. He did not go to follow-up with Dr. Amalia Hailey of podiatry and I have again talked to him about the pros and cons of this. He does not want to consider a ray amputation of this time. He is aware of the risks of recurrence, migration etc. He started HBO today and tolerated this well. He can complete the Augmentin that I gave him last week. I have looked over the lab work that Dr. Chana Bode ordered his C-reactive protein was 3.3 and his sedimentation rate was 17. The C-reactive protein is never really been measurably that high in this patient 12/15; not much change in the wound today however he has undermining along the lateral part of the foot again more extensively than last week. He has some rims of epithelialization. We have been using silver alginate. He is undergoing hyperbarics but did not dive  today 12/18; in for his obligatory first total contact cast change. Unfortunately there was pus coming from the undermining area around his fifth metatarsal head. This was cultured but will preclude reapplication of a cast. He is seen in conjunction with HBO 12/24; patient had staph lugdunensis in the wound in the undermining area laterally last time. We put him on doxycycline which should have covered this. The wound looks better today. I am going to give him another week of doxycycline before reattempting the total contact cast 12/31; the patient is completing antibiotics. Hemorrhagic debris in the distal part of the wound with some undermining distally. He also had hyper granulation. Extensive debridement with a #5 curette. The infected area that was on the lateral part of the fifth met head is closed over. I do not think he needs any more antibiotics. Patient was  seen prior to HBO. Preparations for a total contact cast were made in the cast will be placed post hyperbarics 04/11/19; once again the patient arrives today without complaint. He had been in a cast all week noted that he had heavy drainage this week. This resulted in large raised areas of macerated tissue around the wound 1/14; wound bed looks better slightly smaller. Hydrofera Blue has been changing himself. He had a heavy drainage last week which caused a lot of maceration around the wound so I took him out of a total contact cast he says the drainage is actually better this week He is seen today in conjunction with HBO 1/21; returns to clinic. He was up in [place] for a day or 2 attending a funeral. He comes back in with the wound larger and with a large area of exposed bone. He had osteomyelitis and septic arthritis of the fifth left metatarsal head while he was in hospital. He received IV antibiotics in the hospital for a prolonged period of time then 3 weeks of Augmentin. Subsequently I gave him 2 weeks of doxycycline for more  superficial wound infection. When I saw this last week the wound was smaller the surface of the wound looks satisfactory. 1/28; patient missed hyperbarics today. Bone biopsy I did last time showed Enterococcus faecalis and Staphylococcus lugdunensis . He has a wide area of exposed bone. We are going to use silver alginate as of today. I had another ethical discussion with the patient. 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 In this situation I think the likelihood of healing this is low. Therefore I have recommended a ray amputation and with the patient's agreement I have referred him to Dr. Doran Durand. The other issue is that his compliance with hyperbarics has been minimal because of his work schedule and given his underlying decision I am going to stop this today READMISSION 10/24/2019 MRI 09/29/2019 left foot IMPRESSION: 1. Apparent skin ulceration inferior and lateral to the 5th metatarsal base with underlying heterogeneous T2 signal and enhancement in the subcutaneous fat. Small peripherally enhancing fluid collections along the plantar and lateral aspects of the 5th metatarsal base suspicious for abscesses. 2. Interval amputation through the mid 5th metatarsal with nonspecific low-level marrow edema and enhancement. Given the proximity to the adjacent soft tissue inflammatory changes, osteomyelitis cannot be excluded. 3. The additional bones appear unremarkable. MRI 09/29/2019 right foot IMPRESSION: 1. Soft tissue ulceration lateral to the 5th MTP joint. There is low-level T2 hyperintensity within the 4th and 5th metatarsal heads and adjacent proximal phalanges without abnormal T1 signal or cortical destruction. These findings are nonspecific and could be seen with early marrow edema, hyperemia or early osteomyelitis. No evidence of septic joint. 2. Mild tenosynovitis and synovial enhancement associated with the extensor digitorum tendons at the  level of the midfoot. 3. Diffuse low-level muscular T2 hyperintensity and enhancement, most consistent with diabetic myopathy. LEFT FOOT BONE Methicillin resistant staphylococcus aureus Staphylococcus lugdunensis MIC MIC CIPROFLOXACIN >=8 RESISTANT Resistant <=0.5 SENSI... Sensitive CLINDAMYCIN <=0.25 SENS... Sensitive >=8 RESISTANT Resistant ERYTHROMYCIN >=8 RESISTANT Resistant >=8 RESISTANT Resistant GENTAMICIN <=0.5 SENSI... Sensitive <=0.5 SENSI... Sensitive Inducible Clindamycin NEGATIVE Sensitive NEGATIVE Sensitive OXACILLIN >=4 RESISTANT Resistant 2 SENSITIVE Sensitive RIFAMPIN <=0.5 SENSI... Sensitive <=0.5 SENSI... Sensitive TETRACYCLINE <=1 SENSITIVE Sensitive <=1 SENSITIVE Sensitive TRIMETH/SULFA <=10 SENSIT Sensitive <=10 SENSIT Sensitive ... Marland Kitchen.. VANCOMYCIN 1 SENSITIVE Sensitive <=0.5 SENSI... Sensitive Right foot bone . Component 3 wk ago Specimen Description BONE Special Requests RIGHT 4 METATARSAL  SAMPLE B Gram Stain NO WBC SEEN NO ORGANISMS SEEN Culture RARE METHICILLIN RESISTANT STAPHYLOCOCCUS AUREUS NO ANAEROBES ISOLATED Performed at [facility], [place] [address], [place], [place] [address] Report Status 10/08/2019 FINAL Organism ID, Bacteria METHICILLIN RESISTANT STAPHYLOCOCCUS AUREUS Resulting Agency [facility] Susceptibility Methicillin resistant staphylococcus aureus MIC CIPROFLOXACIN >=8 RESISTANT Resistant CLINDAMYCIN <=0.25 SENS... Sensitive ERYTHROMYCIN >=8 RESISTANT Resistant GENTAMICIN <=0.5 SENSI... Sensitive Inducible Clindamycin NEGATIVE Sensitive OXACILLIN >=4 RESISTANT Resistant RIFAMPIN <=0.5 SENSI... Sensitive TETRACYCLINE <=1 SENSITIVE Sensitive TRIMETH/SULFA <=10 SENSIT Sensitive ... VANCOMYCIN 1 SENSITIVE Sensitive This is a patient we had in clinic earlier this year with a wound over his left fifth metatarsal head. 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 Doran Durand on 05/16/2019. He seems to have developed problems with wounds on his bilateral feet in June 2021 although he may have had problems earlier than this. He was in an urgent care with a right foot ulcer on 09/26/2019 and given a course of doxycycline. This was apparently after having trouble getting into see orthopedics. He was seen by podiatry on 09/28/2019 noted to have bilateral lower extremity ulcers including the left lateral fifth metatarsal base and the right subfifth met head. It was noted that had purulent drainage at that time. He required hospitalization from 6/20 through 7/2. This was because of worsening right foot wounds. He underwent bilateral operative incision and drainage and bone biopsies bilaterally. Culture results are listed above. He has been referred back to clinic by Dr. Jacqualyn Posey of podiatry. He is also followed by Dr. Megan Salon who saw him yesterday. He was discharged from hospital on Zyvox Flagyl and Levaquin and yesterday changed to doxycycline Flagyl and Levaquin. His inflammatory markers on 6/26 showed a sedimentation rate of 129 and a C-reactive protein of 5. This is improved to 14 and 1.3 respectively. This would indicate improvement. ABIs in our clinic today were 1.23 on the right and 1.20 on the left 11/01/2019 on evaluation today patient appears to be doing fairly well in regard to the wounds on his feet at this point. Fortunately there is no signs of active infection at this time. No fevers, chills, nausea, vomiting, or diarrhea. He currently is seeing infectious disease and still under their care at this point. Subsequently he also has both wounds which she has not been using collagen on as he did not receive that in his packaging he did not call us and  let us know that. Apparently that just was missed on the order. Nonetheless we will get that straightened out today. 8/9-Patient returns for bilateral foot wounds, using Prisma with hydrogel moistened dressings, and the wounds appear stable. Patient using surgical shoes, avoiding much pressure or weightbearing as much as possible 8/16; patient has bilateral foot wounds. 1 on the right lateral foot proximally the other is on the left mid lateral foot. Both required debridement of callus and thick skin around the wounds. We have been using silver collagen 8/27; patient has bilateral lateral foot wounds. The area on the left substantially surrounded by callus and dry skin. This was removed from the wound edge. The underlying wound is small. The area on the right measured somewhat smaller today. We've been using silver collagen the patient was on antibiotics for  underlying osteomyelitis in the left foot. Unfortunately I did not update his antibiotics during today's visit. 9/10 I reviewed Dr. Hale Bogus last notes he felt he had completed antibiotics his inflammatory markers were reasonably well controlled. He has a small wound on the lateral left foot and a tiny area on the right which is just above closed. He is using Hydrofera Blue with border foam he has bilateral surgical shoes 9/24; 2 week f/u. doing well. right foot is closed. left foot still undermined. 10/14; right foot remains closed at the fifth met head. The area over the base of the left fifth metatarsal has a small open area but considerable undermining towards the plantar foot. Thick callus skin around this suggests an adequate pressure relief. We have talked about this. He says he is going to go back into his cam boot. I suggested a total contact cast he did not seem enamored with this suggestion 10/26; left foot base of the fifth metatarsal. Same condition as last time. He has skin over the area with an open wound however the skin is not  adherent. He went to see Dr. Earleen Newport who did an x-ray and culture of his foot I have not reviewed the x-ray but the patient was not told anything. He is on doxycycline 11/11; since the patient was last here he was in the emergency room on 10/30 he was concerned about swelling in the left foot. They did not do any cultures or x-rays. They changed his antibiotics to cephalexin. Previous culture showed group B strep. The cephalexin is appropriate as doxycycline has less than predictable coverage. Arrives in clinic today with swelling over this area under the wound. He also has a new wound on the right fifth metatarsal head 11/18; the patient has a difficult wound on the lateral aspect of the left fifth metatarsal head. The wound was almost ballotable last week I opened it slightly expecting to see purulence however there was just bleeding. I cultured this this was negative. X-ray unchanged. We are trying to get an MRI but I am not sure were going to be able to get this through his insurance. He also has an area on the right lateral fifth metatarsal head this looks healthier 12/3; the patient finally got our MRI. Surprisingly this did not show osteomyelitis. I did show the soft tissue ulceration at the lateral plantar aspect of the fifth metatarsal base with a tiny residual 6 mm abscess overlying the superficial fascia I have tried to culture this area I have not been able to get this to grow anything. Nevertheless the protruding tissue looks aggravated. I suspect we should try to treat the underlying "abscess with broad-spectrum antibiotics. I am going to start him on Levaquin and Flagyl. He has much less edema in his legs and I am going to continue to wrap his legs and see him weekly 12/10. I started Levaquin and Flagyl on him last week. He just picked up the Flagyl apparently there was some delay. The worry is the wound on the left fifth metatarsal base which is substantial and worsening. His foot looks  like he inverts at the ankle making this a weightbearing surface. Certainly no improvement in fact I think the measurements of this are somewhat worse. We have been using 12/17; he apparently just got the Levaquin yesterday this is 2 weeks after the fact. He has completed the Flagyl. The area over the left fifth metatarsal base still has protruding granulation tissue although it does not look  quite as bad as it did some weeks ago. He has severe bilateral lymphedema although we have not been treating him for wounds on his legs this is definitely going to require compression. There was so much edema in the left I did not wish to put him in a total contact cast today. I am going to increase his compression from 3-4 layer. The area on the right lateral fifth met head actually look quite good and superficial. 12/23; patient arrived with callus on the right fifth met head and the substantial hyper granulated callused wound on the base of his fifth metatarsal. He says he is completing his Levaquin in 2 days but I do not think that adds up with what I gave him but I will have to double check this. We are using Hydrofera Blue on both areas. My plan is to put the left leg in a cast the week after New Year's 04/06/2020; patient's wounds about the same. Right lateral fifth metatarsal head and left lateral foot over the base of the fifth metatarsal. There is undermining on the left lateral foot which I removed before application of total contact cast continuing with Hydrofera Blue new. Patient tells me he was seen by endocrinology today lab work was done [Dr. Kerr]. Also wondering whether he was referred to cardiology. I went over some lab work from previously does not have chronic renal failure certainly not nephrotic range proteinuria he does have very poorly controlled diabetes but this is not his most updated lab work. Hemoglobin A1c has been over 11 1/10; the patient had a considerable amount of leakage towards  mid part of his left foot with macerated skin however the wound surface looks better the area on the right lateral fifth met head is better as well. I am going to change the dressing on the left foot under the total contact cast to silver alginate, continue with Hydrofera Blue on the right. 1/20; patient was in the total contact cast for 10 days. Considerable amount of drainage although the skin around the wound does not look too bad on the left foot. The area on the right fifth metatarsal head is closed. Our nursing staff reports large amount of drainage out of the left lateral foot wound 1/25; continues with copious amounts of drainage described by our intake staff. PCR culture I did last week showed E. coli and Enterococcus faecalis and low quantities. Multiple resistance genes documented including extended spectrum beta lactamase, MRSA, MRSE, quinolone, tetracycline. The wound is not quite as good this week as it was 5 days ago but about the same size 2/3; continues with copious amounts of malodorous drainage per our intake nurse. The PCR culture I did 2 weeks ago showed E. coli and low quantities of Enterococcus. There were multiple resistance genes detected. I put Neosporin on him last week although this does not seem to have helped. The wound is slightly deeper today. Offloading continues to be an issue here although with the amount of drainage she has a total contact cast is just not going to work 2/10; moderate amount of drainage. Patient reports he cannot get his stocking on over the dressing. I told him we have to do that the nurse gave him suggestions on how to make this work. The wound is on the bottom and lateral part of his left foot. Is cultured predominantly grew low amounts of Enterococcus, E. coli and anaerobes. There were multiple resistance genes detected including extended spectrum beta lactamase, quinolone, tetracycline.  I could not think of an easy oral combination to address  this so for now I am going to do topical antibiotics provided by [facility] I think the main agents here are vancomycin and an aminoglycoside. We have to be able to give him access to the wounds to get the topical antibiotic on 2/17; moderate amount of drainage this is unchanged. He has his Keystone topical antibiotic against the deep tissue culture organisms. He has been using this and changing the dressing daily. Silver alginate on the wound surface. 2/24; using Keystone antibiotic with silver alginate on the top. He had too much drainage for a total contact cast at one point although I think that is improving and I think in the next week or 2 it might be possible to replace a total contact cast I did not do this today. In general the wound surface looks healthy however he continues to have thick rims of skin and subcutaneous tissue around the wide area of the circumference which I debrided 06/04/2020 upon evaluation today patient appears to be doing well in regard to his wound. I do feel like he is showing signs of improvement. There is little bit of callus and dead tissue around the edges of the wound as well as what appears to be a little bit of a sinus tract that is off to the side laterally I would perform debridement to clear that away today. 3/17; left lateral foot. The wound looks about the same as I remember. Not much depth surface looks healthy. No evidence of infection 3/25; left lateral foot. Wound surface looks about the same. Separating epithelium from the circumference. There really is no evidence of infection here however not making progress by my view 3/29; left lateral foot. Surface of the wound again looks reasonably healthy still thick skin and subcutaneous tissue around the wound margins. There is no evidence of infection. One of the concerns being brought up by the nurses has again the amount of drainage vis--vis continued use of a total contact cast 4/5; left lateral foot at  roughly the base of the fifth metatarsal. Nice healthy looking granulated tissue with rims of epithelialization. The overall wound measurements are not any better but the tissue looks healthy. The only concern is the amount of drainage although he has no surrounding maceration with what we have been doing recently to absorb fluid and protect his skin. He also has lymphedema. 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 He states that the kerlix/coban under the TCC rolled down and could not get it back up. He has been using an offloading scooter and has somehow hurt his right foot using this device. This happened last week. He states that the side of his right foot developed a blister and opened. The top of his foot also has a few small open wounds he thinks is due to his socks rubbing in his shoes. He has not been using any dressings to the wound. He denies purulent drainage, fever/chills or erythema to the wounds. 4/22; patient presents for 1 week follow-up. He developed new wounds to the right foot that were evaluated at last clinic visit. He continues to have a total contact cast to the left leg and he  reports no issues. He has been using silver collagen to the right foot wounds with no issues. He denies purulent drainage, fever/chills or erythema to the right foot wounds. He has no complaints today 4/25; patient presents for 1 week follow-up. He has a total contact cast of the left leg and reports no issues. He has been using silver alginate to the right foot wound. He denies purulent drainage, fever/chills or erythema to the right foot wounds. 5/2 patient presents for 1 week follow-up. T contact cast on the left. The wound which is on the base of the  plantar foot at the base of the fifth metatarsal otal actually looks quite good and dimensions continue to gradually contract. HOWEVER the area on the right lateral fifth metatarsal head is much larger than what I remember from 2 weeks ago. Once more is he has significant levels of hypergranulation. Noteworthy that he had this same hyper granulated response on his wound on the left foot at one point in time. So much so that he I thought there was an underlying fluid collection. Based on this I think this just needs debridement. 5/9; the wound on the left actually continues to be gradually smaller with a healthy surface. Slight amount of drainage and maceration of the skin around but not too bad. However he has a large wound over the right fifth metatarsal head very much in the same configuration as his left foot wound was initially. I used silver nitrate to address the hyper granulated tissue no mechanical debridement 5/16; area on the left foot did not look as healthy this week deeper thick surrounding macerated skin and subcutaneous tissue. oo The area on the right foot fifth met head was about the same oo The area on the right ankle that we identified last week is completely broken down into an open wound presumably a stocking rubbing issue 5/23; patient has been using a total contact cast to the left side. He has been using silver alginate underneath. He has also been using silver alginate to the right foot wounds. He has no complaints today. He denies any signs of infection. 5/31; the left-sided wound looks some better measure smaller surface granulation looks better. We have been using silver alginate under the total contact cast oo The large area on his right fifth met head and right dorsal foot look about the same still using silver alginate 6/6; neither side is good as I was hoping although the surface area dimensions are better. A lot of maceration on his left and right foot around the  wound edge. Area on the dorsal right foot looks better. He says he was traveling. I am not sure what does the amount of maceration around the plantar wounds may be drainage issues Objective Constitutional Sitting or standing Blood Pressure is within target range for patient.. Pulse regular and within target range for patient.Marland Kitchen Respirations regular, non-labored and within target range.. Temperature is normal and within the target range for the patient.Marland Kitchen Appears in no distress. Vitals Time Taken: 12:43 PM, Height: 77 in, Weight: 280 lbs, BMI: 33.2, Temperature: 98.7 F, Pulse: 106 bpm, Respiratory Rate: 18 breaths/min, Blood Pressure: 123/69 mmHg, Capillary Blood Glucose: 140 mg/dl. Cardiovascular Pedal pulses palpable and strong bilaterally.. General Notes: Wound exam; left lateral foot. A lot of surrounding maceration here. I suspect this is drainage from the wound as it is isolated. oo Same issue on the right removed callus and skin from the edge of this wound which is really built  up. The dorsal right foot wound actually looks a little better. oo Still the total contact cast on the left Integumentary (Hair, Skin) Wound #3 status is Open. Original cause of wound was Trauma. The date acquired was: 10/02/2019. The wound has been in treatment 45 weeks. The wound is located on the Left,Lateral Foot. The wound measures 2.1cm length x 1.9cm width x 0.4cm depth; 3.134cm^2 area and 1.253cm^3 volume. There is Fat Layer (Subcutaneous Tissue) exposed. There is no tunneling or undermining noted. There is a large amount of serosanguineous drainage noted. The wound margin is thickened. There is large (67-100%) red granulation within the wound bed. There is no necrotic tissue within the wound bed. Wound #5 status is Open. Original cause of wound was Gradually Appeared. The date acquired was: 07/07/2020. The wound has been in treatment 7 weeks. The wound is located on the Right,Lateral Metatarsal head fifth.  The wound measures 2.4cm length x 3.1cm width x 0.5cm depth; 5.843cm^2 area and 2.922cm^3 volume. There is Fat Layer (Subcutaneous Tissue) exposed. There is no tunneling noted, however, there is undermining starting at 2:00 and ending at 3:00 with a maximum distance of 0.4cm. There is a medium amount of serosanguineous drainage noted. The wound margin is well defined and not attached to the wound base. There is large (67-100%) red, pink granulation within the wound bed. There is a small (1-33%) amount of necrotic tissue within the wound bed including Adherent Slough. General Notes: Calloused periwound Wound #7 status is Open. Original cause of wound was Gradually Appeared. The date acquired was: 08/17/2020. The wound has been in treatment 3 weeks. The wound is located on the Right,Anterior Ankle. The wound measures 1.7cm length x 2cm width x 0.1cm depth; 2.67cm^2 area and 0.267cm^3 volume. There is Fat Layer (Subcutaneous Tissue) exposed. There is no tunneling or undermining noted. There is a medium amount of serosanguineous drainage noted. The wound margin is distinct with the outline attached to the wound base. There is large (67-100%) pink, friable granulation within the wound bed. There is a small (1- 33%) amount of necrotic tissue within the wound bed including Adherent Slough. 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 There was a Excisional Skin/Subcutaneous Tissue Debridement with a total area of 7.44 sq cm  performed by [name]., MD. With the following instrument(s): Curette to remove Viable and Non-Viable tissue/material. Material removed includes Subcutaneous Tissue and Skin: Epidermis and. No specimens were taken. A time out was conducted at 13:10, prior to the start of the procedure. A Moderate amount of bleeding was controlled with Silver Nitrate. The procedure was tolerated well with a pain level of 0 throughout and a pain level of 0 following the procedure. Post Debridement Measurements: 2.4cm length x 3.1cm width x 0.5cm depth; 2.922cm^3 volume. Character of Wound/Ulcer Post Debridement is stable. Severity of Tissue Post Debridement is: Fat layer exposed. 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 There was a T Contact Cast otal  Procedure by [name]., MD. Post procedure Diagnosis Wound #3: Same as Pre-Procedure Plan Follow-up Appointments: Return Appointment in 1 week. - ***60 minutes for cast*** - with Dr. Arcola Jansky Shower/ Hygiene: May shower with protection but do not get wound dressing(s) wet. - use a cast protector to left leg. Edema Control - Lymphedema / SCD / Other: Elevate legs to the level of the heart or above for 30 minutes daily and/or when sitting, a frequency of: - throughout the day Avoid standing for long periods of time. Exercise regularly Moisturize legs daily. - right leg every night before bed. Compression stocking or Garment 20-30 mm/Hg pressure to: - Apply to right leg in the morning and remove at night. Off-Loading: T Contact Cast to Left Lower Extremity otal Open toe surgical shoe to: - right foot WOUND #3: - Foot Wound Laterality: Left, Lateral Cleanser: Soap and Water 1 x Per Week/30 Days Discharge Instructions: May shower and wash wound with dial antibacterial soap and water prior to  dressing change. Peri-Wound Care: Zinc Oxide Ointment 30g tube 1 x Per Week/30 Days Discharge Instructions: Apply Zinc Oxide to periwound with each dressing change as needed. Prim Dressing: KerraCel Ag Gelling Fiber Dressing, 2x2 in (silver alginate) 1 x Per Week/30 Days ary Discharge Instructions: Apply silver alginate to wound bed as instructed Secondary Dressing: Woven Gauze Sponge, Non-Sterile 4x4 in 1 x Per Week/30 Days Discharge Instructions: Apply over primary dressing as directed. Secondary Dressing: ABD Pad, 8x10 1 x Per Week/30 Days Discharge Instructions: T down before applying kerlix. Apply over primary dressing as directed. ape Secondary Dressing: Zetuvit Plus 4x8 in 1 x Per Week/30 Days Discharge Instructions: Apply over primary dressing as directed. Secured With: [employer], 4.5x3.1 (in/yd) 1 x Per Week/30 Days Discharge Instructions: Secure with Kerlix as directed. WOUND #5: - Metatarsal head fifth Wound Laterality: Right, Lateral Cleanser: Normal Saline (Generic) Every Other Day/15 Days Discharge Instructions: Cleanse the wound with Normal Saline prior to applying a clean dressing using gauze sponges, not tissue or cotton balls. Cleanser: Soap and Water Every Other Day/15 Days Discharge Instructions: May shower and wash wound with dial antibacterial soap and water prior to dressing change. Prim Dressing: KerraCel Ag Gelling Fiber Dressing, 2x2 in (silver alginate) (Generic) Every Other Day/15 Days ary Discharge Instructions: Apply silver alginate to wound bed as instructed Secondary Dressing: ABD Pad, 8x10 (Generic) Every Other Day/15 Days Discharge Instructions: Apply over primary dressing as directed. Secondary Dressing: Optifoam Non-Adhesive Dressing, 4x4 in (Generic) Every Other Day/15 Days Discharge Instructions: Apply over primary dressing, cut to make foam donut to offload Secured With: Kerlix Roll Sterile, 4.5x3.1 (in/yd) (Generic) Every Other Day/15  Days Discharge Instructions: Secure with Kerlix as directed. Secured With: 69M Medipore H Soft Cloth Surgical T ape, 2x2 (in/yd) (Generic) Every Other Day/15 Days Discharge Instructions: Secure dressing with tape as directed. WOUND #7: - Ankle Wound Laterality: Right, Anterior Cleanser: Normal Saline Every Other Day/15 Days Discharge Instructions: Cleanse the wound with Normal Saline prior to applying a clean dressing using gauze sponges, not tissue or cotton balls. Cleanser: Soap and Water Every Other Day/15 Days Discharge Instructions: May shower and wash wound with dial antibacterial soap and water prior to dressing change. Prim Dressing: KerraCel Ag Gelling Fiber Dressing, 2x2 in (silver alginate) Every Other Day/15 Days ary Discharge Instructions: Apply silver alginate to wound bed as instructed Secondary Dressing: Woven Gauze Sponge, Non-Sterile 4x4 in Every Other Day/15 Days Discharge Instructions: Apply over primary dressing as directed. Secondary  Dressing: ABD Pad, 8x10 Every Other Day/15 Days Discharge Instructions: Apply over primary dressing as directed. Secured With: [employer], 4.5x3.1 (in/yd) Every Other Day/15 Days Discharge Instructions: Secure with Kerlix as directed. Secured With: 59M Medipore H Soft Cloth Surgical T ape, 2x2 (in/yd) Every Other Day/15 Days Discharge Instructions: Secure dressing with tape as directed. 1. I continue with silver alginate to all wounds 2. Continue with a total contact cast on the left 3. Debridement on the right plantar foot wound with a #5 curette Electronic Signature(s) Signed: 09/07/2020 4:50:54 PM By: Max Ham MD Entered By: [name] on 09/07/2020 13:27:02 -------------------------------------------------------------------------------- Total Contact Cast Details Patient Name: Date of Service: [name] 09/07/2020 12:30 PM Medical Record Number: [id number] Patient Account Number: [id number] Date of  Birth/Sex: Treating RN: 30-Aug-1985 (35 y.o. [name] Primary Care Provider: Seward [name] Other Clinician: Referring Provider: Treating Provider/Extender: [name] in Treatment: 51 T Contact Cast Applied for Wound Assessment: otal Wound #3 Left,Lateral Foot Performed By: Physician [name]., MD Post Procedure Diagnosis Same as Pre-procedure Electronic Signature(s) Signed: 09/07/2020 4:50:54 PM By: Max Ham MD Entered By: [name] on 09/07/2020 13:27:59 -------------------------------------------------------------------------------- SuperBill Details Patient Name: Date of Service: [name], [name] 09/07/2020 Medical Record Number: [id number] Patient Account Number: [id number] Date of Birth/Sex: Treating RN: 02-27-1986 (35 y.o. 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 Facility Procedures CPT4 Code: [id number] Description: [phone] - DEBRIDE WOUND 1ST 20 SQ CM OR < ICD-10 Diagnosis Description L97.518 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 Modifier: 56 ity Quantity: 1 CPT4 Code: [id number] Description: 97471 - APPLY TOTAL CONTACT LEG CAST ICD-10 Diagnosis Description L97.528 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i Modifier: ty Quantity: 1 Physician Procedures : CPT4 Code Description Modifier [id number] - WC PHYS DEBR WO ANESTH 20 SQ CM 74 ICD-10  Diagnosis Description L97.518 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ity Quantity: 1 : [id number] - WC PHYS APPLY TOTAL CONTACT CAST ICD-10 Diagnosis Description L97.528 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ity Quantity: 1 Electronic Signature(s) Signed: 09/07/2020 4:50:54 PM By: Max Ham MD Entered By: [name] on 09/07/2020 13:28:41

## 2020-09-09 NOTE — Progress Notes (Signed)
[name], Max ([id number]) Visit Report for 09/07/2020 Arrival Information Details Patient Name: Date of Service: [name], [name] 09/07/2020 12:30 PM Medical Record Number: [id number] Patient Account Number: [id number] Date of Birth/Sex: Treating RN: 04/21/1985 (34 y.o. M) [name], Max Primary Care Provider: [name], Max Other Clinician: Referring Provider: Treating Provider/Extender: [name], [name], Max Weeks in Treatment: 45 Visit Information History Since Last Visit Added or deleted any medications: No Patient Arrived: Ambulatory Any new allergies or adverse reactions: No Arrival Time: 12:42 Had a fall or experienced change in No Accompanied By: self activities of daily living that may affect Transfer Assistance: None risk of falls: Patient Identification Verified: Yes Signs or symptoms of abuse/neglect since last visito No Secondary Verification Process Completed: Yes Hospitalized since last visit: No Patient Requires Transmission-Based Precautions: No Implantable device outside of the clinic excluding No Patient Has Alerts: No cellular tissue based products placed in the center since last visit: Has Dressing in Place as Prescribed: Yes Pain Present Now: Yes Electronic Signature(s) Signed: 09/07/2020 2:54:57 PM By: [name], Max Entered By: [name], Max on 09/07/2020 12:43:33 -------------------------------------------------------------------------------- Encounter Discharge Information Details Patient Name: Date of Service: [name], [name] 09/07/2020 12:30 PM Medical Record Number: [id number] Patient Account Number: [id number] Date of Birth/Sex: Treating RN: 10/16/1985 (34 y.o. M) [name], Max Primary Care Provider: [name], Max Other Clinician: Referring Provider: Treating Provider/Extender: [name], [name], Max Weeks in Treatment: 45 Encounter Discharge Information Items Post Procedure Vitals Discharge Condition:  Stable Temperature (F): 98.7 Ambulatory Status: Ambulatory Pulse (bpm): 106 Discharge Destination: Home Respiratory Rate (breaths/min): 18 Transportation: Private Auto Blood Pressure (mmHg): 123/69 Accompanied By: self Schedule Follow-up Appointment: Yes Clinical Summary of Care: Electronic Signature(s) Signed: 09/07/2020 5:44:33 PM By: [name], Max Entered By: [name], Max on 09/07/2020 17:43:55 -------------------------------------------------------------------------------- Lower Extremity Assessment Details Patient Name: Date of Service: [name], [name] 09/07/2020 12:30 PM Medical Record Number: [id number] Patient Account Number: [id number] Date of Birth/Sex: Treating RN: 02/01/1986 (34 y.o. M) [name], Max Primary Care Provider: [name], Max Other Clinician: Referring Provider: Treating Provider/Extender: [name], [name], Max Weeks in Treatment: 45 Edema Assessment Assessed: [Left: Yes] [Right: Yes] Edema: [Left: Yes] [Right: Yes] Calf Left: Right: Point of Measurement: 48 cm From Medial Instep 53 cm 51 cm Ankle Left: Right: Point of Measurement: 10 cm From Medial Instep 34 cm 32 cm Vascular Assessment Pulses: Dorsalis Pedis Palpable: [Left:Yes] [Right:Yes] Electronic Signature(s) Signed: 09/07/2020 5:38:29 PM By: [name], Max Entered By: [name], Max on 09/07/2020 13:00:19 -------------------------------------------------------------------------------- Multi Wound Chart Details Patient Name: Date of Service: [name], [name] 09/07/2020 12:30 PM Medical Record Number: [id number] Patient Account Number: [id number] Date of Birth/Sex: Treating RN: 04/18/1985 (34 y.o. M) [name], Max Primary Care Provider: [name], Max Other Clinician: Referring Provider: Treating Provider/Extender: [name], [name], Max Weeks in Treatment: 45 Vital Signs Height(in): 77 Capillary Blood Glucose(mg/dl): 140 Weight(lbs): 280 Pulse(bpm): 106 Body  Mass Index(BMI): 33 Blood Pressure(mmHg): 123/69 Temperature(°F): 98.7 Respiratory Rate(breaths/min): 18 Photos: [3:No Photos Left, Lateral Foot] [5:No Photos Right, Lateral Metatarsal head fifth] [7:No Photos Right, Anterior Ankle] Wound Location: [3:Trauma] [5:Gradually Appeared] [7:Gradually Appeared] Wounding Event: [3:Diabetic Wound/Ulcer of the Lower] [5:Diabetic Wound/Ulcer of the Lower] [7:Diabetic Wound/Ulcer of the Lower] Primary Etiology: [3:Extremity Type II Diabetes] [5:Extremity Type II Diabetes] [7:Extremity Type II Diabetes] Comorbid History: [3:10/02/2019] [5:07/07/2020] [7:08/17/2020] Date Acquired: [3:45] [5:7] [7:3] Weeks of Treatment: [3:Open] [5:Open] [7:Open] Wound Status: [3:2.1x1.9x0.4] [5:2.4x3.1x0.5] [7:1.7x2x0.1] Measurements L x W x [name] [3:3.134] [5:5.843] [7:2.67] A (cm) : rea [3:1.253] [  5:2.922] [7:0.267] Volume (cm) : [3:-90.10%] [5:33.60%] [7:-17.60%] % Reduction in A rea: [3:-659.40%] [5:33.60%] [7:-17.60%] % Reduction in Volume: [5:2] Starting Position 1 (o'clock): [5:3] Ending Position 1 (o'clock): [5:0.4] Maximum Distance 1 (cm): [3:No] [5:Yes] [7:No] Undermining: [3:Grade 2] [5:Grade 2] [7:Grade 2] Classification: [3:Large] [5:Medium] [7:Medium] Exudate A mount: [3:Serosanguineous] [5:Serosanguineous] [7:Serosanguineous] Exudate Type: [3:red, brown] [5:red, brown] [7:red, brown] Exudate Color: [3:Thickened] [5:Well defined, not attached] [7:Distinct, outline attached] Wound Margin: [3:Large (67-100%)] [5:Large (67-100%)] [7:Large (67-100%)] Granulation A mount: [3:Red] [5:Red, Pink] [7:Pink, Friable] Granulation Quality: [3:None Present (0%)] [5:Small (1-33%)] [7:Small (1-33%)] Necrotic A mount: [3:Fat Layer (Subcutaneous Tissue): Yes Fat Layer (Subcutaneous Tissue): Yes Fat Layer (Subcutaneous Tissue): Yes] Exposed Structures: [3:Fascia: No Tendon: No Muscle: No Joint: No Bone: No Small (1-33%)] [5:Fascia: No Tendon: No Muscle: No Joint: No  Bone: No None] [7:Fascia: No Tendon: No Muscle: No Joint: No Bone: No Small (1-33%)] Epithelialization: [3:N/A] [5:Debridement - Excisional] [7:N/A] Debridement: Pre-procedure Verification/Time Out N/A [5:13:10] [7:N/A] Taken: [3:N/A] [5:Subcutaneous] [7:N/A] Tissue Debrided: [3:N/A] [5:Skin/Subcutaneous Tissue] [7:N/A] Level: [3:N/A] [5:7.44] [7:N/A] Debridement A (sq cm): [3:rea N/A] [5:Curette] [7:N/A] Instrument: [3:N/A] [5:Moderate] [7:N/A] Bleeding: [3:N/A] [5:Silver Nitrate] [7:N/A] Hemostasis A chieved: [3:N/A] [5:0] [7:N/A] Procedural Pain: [3:N/A] [5:0] [7:N/A] Post Procedural Pain: [3:N/A] [5:Procedure was tolerated well] [7:N/A] Debridement Treatment Response: [3:N/A] [5:2.4x3.1x0.5] [7:N/A] Post Debridement Measurements L x W x [name] [3:N/A] [5:2.922] [7:N/A] Post Debridement Volume: (cm) [3:N/A] [5:Calloused periwound] [7:N/A] Assessment Notes: [3:T Contact Cast otal] [5:Debridement] [7:N/A] Treatment Notes Electronic Signature(s) Signed: 09/07/2020 4:50:54 PM By: [name], Michael MD Signed: 09/09/2020 5:57:05 PM By: [name], Shatara RN, BSN Entered By: [name], Max on 09/07/2020 13:21:03 -------------------------------------------------------------------------------- Multi-Disciplinary Care Plan Details Patient Name: Date of Service: [name], [name] 09/07/2020 12:30 PM Medical Record Number: [id number] Patient Account Number: [id number] Date of Birth/Sex: Treating RN: 07/12/1985 (34 y.o. M) [name], Max Primary Care Provider: [name], Max Other Clinician: Referring Provider: Treating Provider/Extender: [name], [name], Max Weeks in Treatment: 45 Multidisciplinary Care Plan reviewed with physician Active Inactive Nutrition Nursing Diagnoses: Imbalanced nutrition Potential for alteratiion in Nutrition/Potential for imbalanced nutrition Goals: Patient/caregiver agrees to and verbalizes understanding of need to use nutritional supplements and/or  vitamins as prescribed Date Initiated: 10/24/2019 Date Inactivated: 04/06/2020 Target Resolution Date: 04/03/2020 Goal Status: Met Patient/caregiver will maintain therapeutic glucose control Date Initiated: 10/24/2019 Target Resolution Date: 09/18/2020 Goal Status: Active Interventions: Assess HgA1c results as ordered upon admission and as needed Assess patient nutrition upon admission and as needed per policy Provide education on elevated blood sugars and impact on wound healing Provide education on nutrition Treatment Activities: Education provided on Nutrition : 07/21/2020 Notes: Wound/Skin Impairment Nursing Diagnoses: Impaired tissue integrity Knowledge deficit related to ulceration/compromised skin integrity Goals: Patient/caregiver will verbalize understanding of skin care regimen Date Initiated: 10/24/2019 Target Resolution Date: 09/18/2020 Goal Status: Active Ulcer/skin breakdown will have a volume reduction of 30% by week 4 Date Initiated: 10/24/2019 Date Inactivated: 01/16/2020 Target Resolution Date: 01/10/2020 Unmet Reason: no change in Goal Status: Unmet measurements. Interventions: Assess patient/caregiver ability to obtain necessary supplies Assess patient/caregiver ability to perform ulcer/skin care regimen upon admission and as needed Assess ulceration(s) every visit Provide education on ulcer and skin care Notes: Electronic Signature(s) Signed: 09/09/2020 5:57:05 PM By: [name], Shatara RN, BSN Entered By: [name], Max on 09/07/2020 14:13:39 -------------------------------------------------------------------------------- Pain Assessment Details Patient Name: Date of Service: [name], [name] 09/07/2020 12:30 PM Medical Record Number: [id number] Patient Account Number: [id number] Date of Birth/Sex: Treating RN: 09/27/1985 (34 y.o. M) [name], Max   Primary Care Provider: [name], Max Other Clinician: Referring Provider: Treating Provider/Extender: [name],  [name], Max Weeks in Treatment: 45 Active Problems Location of Pain Severity and Description of Pain Patient Has Paino Yes Site Locations Rate the pain. Rate the pain. Current Pain Level: 4 Pain Management and Medication Current Pain Management: Electronic Signature(s) Signed: 09/07/2020 2:54:57 PM By: [name], Max Signed: 09/09/2020 5:57:05 PM By: [name], Shatara RN, BSN Entered By: [name], Max on 09/07/2020 12:44:53 -------------------------------------------------------------------------------- Patient/Caregiver Education Details Patient Name: Date of Service: [name], [name] 6/6/2022andnbsp12:30 PM Medical Record Number: [id number] Patient Account Number: [id number] Date of Birth/Gender: Treating RN: 05/26/1985 (34 y.o. M) [name], Max Primary Care Physician: [name], Max Other Clinician: Referring Physician: Treating Physician/Extender: [name], [name], Max Weeks in Treatment: 45 Education Assessment Education Provided To: Patient Education Topics Provided Wound/Skin Impairment: Methods: Explain/Verbal Responses: State content correctly Electronic Signature(s) Signed: 09/09/2020 5:57:05 PM By: [name], Shatara RN, BSN Entered By: [name], Max on 09/07/2020 14:13:50 -------------------------------------------------------------------------------- Wound Assessment Details Patient Name: Date of Service: [name], [name] 09/07/2020 12:30 PM Medical Record Number: [id number] Patient Account Number: [id number] Date of Birth/Sex: Treating RN: 04/13/1985 (34 y.o. M) [name], Max Primary Care Provider: [name], Max Other Clinician: Referring Provider: Treating Provider/Extender: [name], [name], Max Weeks in Treatment: 45 Wound Status Wound Number: 3 Primary Etiology: Diabetic Wound/Ulcer of the Lower Extremity Wound Location: Left, Lateral Foot Wound Status: Open Wounding Event: Trauma Comorbid History: Type II  Diabetes Date Acquired: 10/02/2019 Weeks Of Treatment: 45 Clustered Wound: No Photos Wound Measurements Length: (cm) 2.1 Width: (cm) 1.9 Depth: (cm) 0.4 Area: (cm) 3.134 Volume: (cm) 1.253 % Reduction in Area: -90.1% % Reduction in Volume: -659.4% Epithelialization: Small (1-33%) Tunneling: No Undermining: No Wound Description Classification: Grade 2 Wound Margin: Thickened Exudate Amount: Large Exudate Type: Serosanguineous Exudate Color: red, brown Foul Odor After Cleansing: No Slough/Fibrino No Wound Bed Granulation Amount: Large (67-100%) Exposed Structure Granulation Quality: Red Fascia Exposed: No Necrotic Amount: None Present (0%) Fat Layer (Subcutaneous Tissue) Exposed: Yes Tendon Exposed: No Muscle Exposed: No Joint Exposed: No Bone Exposed: No Treatment Notes Wound #3 (Foot) Wound Laterality: Left, Lateral Cleanser Soap and Water Discharge Instruction: May shower and wash wound with dial antibacterial soap and water prior to dressing change. Peri-Wound Care Zinc Oxide Ointment 30g tube Discharge Instruction: Apply Zinc Oxide to periwound with each dressing change as needed. Topical Primary Dressing KerraCel Ag Gelling Fiber Dressing, 2x2 in (silver alginate) Discharge Instruction: Apply silver alginate to wound bed as instructed Secondary Dressing Woven Gauze Sponge, Non-Sterile 4x4 in Discharge Instruction: Apply over primary dressing as directed. Zetuvit Plus 4x8 in Discharge Instruction: Apply over primary dressing as directed. ABD Pad, 8x10 Discharge Instruction: T down before applying kerlix. Apply over primary dressing as directed. ape Secured With Kerlix Roll Sterile, 4.5x3.1 (in/yd) Discharge Instruction: Secure with Kerlix as directed. Compression Wrap Compression Stockings Add-Ons Notes TCC applied, last layer applied by MD. Electronic Signature(s) Signed: 09/08/2020 2:13:29 PM By: [name], Max Signed: 09/08/2020 5:30:54 PM By:  [name], Max Previous Signature: 09/07/2020 5:38:29 PM Version By: [name], Max Entered By: [name], Max on 09/08/2020 13:37:41 -------------------------------------------------------------------------------- Wound Assessment Details Patient Name: Date of Service: [name], [name] 09/07/2020 12:30 PM Medical Record Number: [id number] Patient Account Number: [id number] Date of Birth/Sex: Treating RN: 04/19/1985 (34 y.o. M) [name], Max Primary Care Provider: [name], Max Other Clinician: Referring Provider: Treating Provider/Extender: [name], [name], Max Weeks in Treatment: 45 Wound Status Wound Number: 5 Primary Etiology: Diabetic Wound/Ulcer   of the Lower Extremity Wound Location: Right, Lateral Metatarsal head fifth Wound Status: Open Wounding Event: Gradually Appeared Comorbid History: Type II Diabetes Date Acquired: 07/07/2020 Weeks Of Treatment: 7 Clustered Wound: No Photos Wound Measurements Length: (cm) 2.4 Width: (cm) 3.1 Depth: (cm) 0.5 Area: (cm) 5.843 Volume: (cm) 2.922 % Reduction in Area: 33.6% % Reduction in Volume: 33.6% Epithelialization: None Tunneling: No Undermining: Yes Starting Position (o'clock): 2 Ending Position (o'clock): 3 Maximum Distance: (cm) 0.4 Wound Description Classification: Grade 2 Wound Margin: Well defined, not attached Exudate Amount: Medium Exudate Type: Serosanguineous Exudate Color: red, brown Foul Odor After Cleansing: No Slough/Fibrino No Wound Bed Granulation Amount: Large (67-100%) Exposed Structure Granulation Quality: Red, Pink Fascia Exposed: No Necrotic Amount: Small (1-33%) Fat Layer (Subcutaneous Tissue) Exposed: Yes Necrotic Quality: Adherent Slough Tendon Exposed: No Muscle Exposed: No Joint Exposed: No Bone Exposed: No Assessment Notes Calloused periwound Treatment Notes Wound #5 (Metatarsal head fifth) Wound Laterality: Right, Lateral Cleanser Normal Saline Discharge  Instruction: Cleanse the wound with Normal Saline prior to applying a clean dressing using gauze sponges, not tissue or cotton balls. Soap and Water Discharge Instruction: May shower and wash wound with dial antibacterial soap and water prior to dressing change. Peri-Wound Care Topical Primary Dressing KerraCel Ag Gelling Fiber Dressing, 2x2 in (silver alginate) Discharge Instruction: Apply silver alginate to wound bed as instructed Secondary Dressing ABD Pad, 8x10 Discharge Instruction: Apply over primary dressing as directed. Optifoam Non-Adhesive Dressing, 4x4 in Discharge Instruction: Apply over primary dressing, cut to make foam donut to offload Secured With [employer] Sterile, 4.5x3.1 (in/yd) Discharge Instruction: Secure with Kerlix as directed. 66M Medipore H Soft Cloth Surgical T ape, 2x2 (in/yd) Discharge Instruction: Secure dressing with tape as directed. Compression Wrap Compression Stockings Add-Ons Notes TCC applied, last layer applied by MD. Electronic Signature(s) Signed: 09/08/2020 2:13:29 PM By: Sandre Kitty Signed: 09/08/2020 5:30:54 PM By: [name] Previous Signature: 09/07/2020 5:38:29 PM Version By: [name] Entered By: Sandre Kitty on 09/08/2020 13:37:16 -------------------------------------------------------------------------------- Wound Assessment Details Patient Name: Date of Service: [name], [name] 09/07/2020 12:30 PM Medical Record Number: [id number] Patient Account Number: [id number] Date of Birth/Sex: Treating RN: Jan 08, 1986 (35 y.o. [name] Primary Care Christohper Dube: [name] Other Clinician: Referring Admir Candelas: Treating [place] Greb/Extender: [name] in Treatment: 45 Wound Status Wound Number: 7 Primary Etiology: Diabetic Wound/Ulcer of the Lower Extremity Wound Location: Right, Anterior Ankle Wound Status: Open Wounding Event: Gradually Appeared Comorbid History: Type II  Diabetes Date Acquired: 08/17/2020 Weeks Of Treatment: 3 Clustered Wound: No Photos Wound Measurements Length: (cm) 1.7 Width: (cm) 2 Depth: (cm) 0.1 Area: (cm) 2.67 Volume: (cm) 0.267 % Reduction in Area: -17.6% % Reduction in Volume: -17.6% Epithelialization: Small (1-33%) Tunneling: No Undermining: No Wound Description Classification: Grade 2 Wound Margin: Distinct, outline attached Exudate Amount: Medium Exudate Type: Serosanguineous Exudate Color: red, brown Foul Odor After Cleansing: No Slough/Fibrino Yes Wound Bed Granulation Amount: Large (67-100%) Exposed Structure Granulation Quality: Pink, Friable Fascia Exposed: No Necrotic Amount: Small (1-33%) Fat Layer (Subcutaneous Tissue) Exposed: Yes Necrotic Quality: Adherent Slough Tendon Exposed: No Muscle Exposed: No Joint Exposed: No Bone Exposed: No Treatment Notes Wound #7 (Ankle) Wound Laterality: Right, Anterior Cleanser Normal Saline Discharge Instruction: Cleanse the wound with Normal Saline prior to applying a clean dressing using gauze sponges, not tissue or cotton balls. Soap and Water Discharge Instruction: May shower and wash wound with dial antibacterial soap and water prior to dressing change. Peri-Wound Care Topical Primary Dressing KerraCel  Ag Gelling Fiber Dressing, 2x2 in (silver alginate) Discharge Instruction: Apply silver alginate to wound bed as instructed Secondary Dressing Woven Gauze Sponge, Non-Sterile 4x4 in Discharge Instruction: Apply over primary dressing as directed. ABD Pad, 8x10 Discharge Instruction: Apply over primary dressing as directed. Secured With [employer], 4.5x3.1 (in/yd) Discharge Instruction: Secure with Kerlix as directed. 8M Medipore H Soft Cloth Surgical T ape, 2x2 (in/yd) Discharge Instruction: Secure dressing with tape as directed. Compression Wrap Compression Stockings Add-Ons Notes TCC applied, last layer applied by MD. Electronic  Signature(s) Signed: 09/08/2020 2:13:29 PM By: Sandre Kitty Signed: 09/08/2020 5:30:54 PM By: [name] Previous Signature: 09/07/2020 5:38:29 PM Version By: [name] Entered By: Sandre Kitty on 09/08/2020 13:36:44 -------------------------------------------------------------------------------- [place]ter Details Patient Name: Date of Service: [name], [name] 09/07/2020 12:30 PM Medical Record Number: [id number] Patient Account Number: [id number] Date of Birth/Sex: Treating RN: 03-Mar-1986 (35 y.o. [name] Primary Care Tujuana Kilmartin: [name] Other Clinician: Referring Tesla Keeler: Treating Shomari Matusik/Extender: [name] in Treatment: 45 Vital Signs Time Taken: 12:43 Temperature (F): 98.7 Height (in): 77 Pulse (bpm): 106 Weight (lbs): 280 Respiratory Rate (breaths/min): 18 Body Mass Index (BMI): 33.2 Blood Pressure (mmHg): 123/69 Capillary Blood Glucose (mg/dl): 140 Reference Range: 80 - 120 mg / dl Electronic Signature(s) Signed: 09/07/2020 2:54:57 PM By: Sandre Kitty Entered By: Sandre Kitty on 09/07/2020 12:44:41

## 2020-09-11 ENCOUNTER — Encounter (HOSPITAL_BASED_OUTPATIENT_CLINIC_OR_DEPARTMENT_OTHER): Payer: BC Managed Care – PPO | Admitting: Internal Medicine

## 2020-09-11 ENCOUNTER — Other Ambulatory Visit: Payer: Self-pay

## 2020-09-11 DIAGNOSIS — E11621 Type 2 diabetes mellitus with foot ulcer: Secondary | ICD-10-CM | POA: Diagnosis not present

## 2020-09-11 NOTE — Progress Notes (Signed)
[place], [name] ([id number]) Visit Report for 09/11/2020 Arrival Information Details Patient Name: Date of Service: [name] 09/11/2020 2:45 PM Medical Record Number: [id number] Patient Account Number: [id number] Date of Birth/Sex: Treating RN: 1985/05/22 (35 y.o. M) Primary Care Gregary Blackard: [name] Other Clinician: Referring Teagyn Fishel: Treating Avian Greenawalt/Extender: [name] in Treatment: 44 Visit Information History Since Last Visit Added or deleted any medications: No Patient Arrived: Ambulatory Any new allergies or adverse reactions: No Arrival Time: 14:41 Had a fall or experienced change in No Accompanied By: self activities of daily living that may affect Transfer Assistance: None risk of falls: Patient Identification Verified: Yes Signs or symptoms of abuse/neglect since last visito No Secondary Verification Process Completed: Yes Hospitalized since last visit: No Patient Requires Transmission-Based Precautions: No Implantable device outside of the clinic excluding No Patient Has Alerts: No cellular tissue based products placed in the center since last visit: Has Dressing in Place as Prescribed: Yes Pain Present Now: No Electronic Signature(s) Signed: 09/11/2020 3:50:24 PM By: Sandre Kitty Entered By: Sandre Kitty on 09/11/2020 14:42:04 -------------------------------------------------------------------------------- Encounter Discharge Information Details Patient Name: Date of Service: [name], [name] 09/11/2020 2:45 PM Medical Record Number: [id number] Patient Account Number: [id number] Date of Birth/Sex: Treating RN: 1985-05-18 (35 y.o. [name] Primary Care Jacquese Cassarino: [name] Other Clinician: Referring Ryott Rafferty: Treating Katena Petitjean/Extender: [name] in Treatment: 43 Encounter Discharge Information Items Discharge Condition: Stable Ambulatory Status: Ambulatory Discharge Destination: Home Transportation:  Private Auto Schedule Follow-up Appointment: Yes Clinical Summary of Care: Provided on 09/11/2020 Form Type Recipient Paper Patient Patient Electronic Signature(s) Signed: 09/11/2020 3:59:20 PM By: [name] Entered By: [name] on 09/11/2020 15:59:20 -------------------------------------------------------------------------------- Lower Extremity Assessment Details Patient Name: Date of Service: [name] 09/11/2020 2:45 PM Medical Record Number: [id number] Patient Account Number: [id number] Date of Birth/Sex: Treating RN: May 17, 1985 (35 y.o. [name] Primary Care Emelly Wurtz: [name] Other Clinician: Referring Desirre Eickhoff: Treating Jazell Rosenau/Extender: [name] in Treatment: 46 Edema Assessment Assessed: [name]: Yes] [name]: Yes] Edema: [Left: Yes] [Right: Yes] Calf Left: Right: Point of Measurement: 48 cm From Medial Instep 53 cm 51 cm Ankle Left: Right: Point of Measurement: 10 cm From Medial Instep 34 cm 32 cm Vascular Assessment Pulses: Dorsalis Pedis Palpable: [Left:Yes] [Right:Yes] Electronic Signature(s) Signed: 09/11/2020 2:51:01 PM By: [name] Entered By: [name] on 09/11/2020 14:51:01 -------------------------------------------------------------------------------- Multi-Disciplinary Care Plan Details Patient Name: Date of Service: [name], [name] 09/11/2020 2:45 PM Medical Record Number: [id number] Patient Account Number: [id number] Date of Birth/Sex: Treating RN: 31-Aug-1985 (35 y.o. [name] Primary Care Nachmen Mansel: [name] Other Clinician: Referring Zurii Hewes: Treating Linah Klapper/Extender: [name] in Treatment: 64 Multidisciplinary Care Plan reviewed with physician Active Inactive Nutrition Nursing Diagnoses: Imbalanced nutrition Potential for alteratiion in Nutrition/Potential for imbalanced nutrition Goals: Patient/caregiver agrees to and verbalizes understanding of need  to use nutritional supplements and/or vitamins as prescribed Date Initiated: 10/24/2019 Date Inactivated: 04/06/2020 Target Resolution Date: 04/03/2020 Goal Status: Met Patient/caregiver will maintain therapeutic glucose control Date Initiated: 10/24/2019 Target Resolution Date: 09/18/2020 Goal Status: Active Interventions: Assess HgA1c results as ordered upon admission and as needed Assess patient nutrition upon admission and as needed per policy Provide education on elevated blood sugars and impact on wound healing Provide education on nutrition Treatment Activities: Education provided on Nutrition : 07/07/2020 Notes: Wound/Skin Impairment Nursing Diagnoses: Impaired tissue integrity Knowledge deficit related to ulceration/compromised skin integrity Goals: Patient/caregiver will verbalize understanding of skin care regimen Date Initiated:  10/24/2019 Target Resolution Date: 09/18/2020 Goal Status: Active Ulcer/skin breakdown will have a volume reduction of 30% by week 4 Date Initiated: 10/24/2019 Date Inactivated: 01/16/2020 Target Resolution Date: 01/10/2020 Unmet Reason: no change in Goal Status: Unmet measurements. Interventions: Assess patient/caregiver ability to obtain necessary supplies Assess patient/caregiver ability to perform ulcer/skin care regimen upon admission and as needed Assess ulceration(s) every visit Provide education on ulcer and skin care Notes: Electronic Signature(s) Signed: 09/11/2020 2:52:45 PM By: [name] Entered By: [name] on 09/11/2020 14:52:44 -------------------------------------------------------------------------------- Pain Assessment Details Patient Name: Date of Service: [name] 09/11/2020 2:45 PM Medical Record Number: [id number] Patient Account Number: [id number] Date of Birth/Sex: Treating RN: Dec 04, 1985 (35 y.o. M) Primary Care Atwood Adcock: [name] Other Clinician: Referring Mithra Spano: Treating  Erian Rosengren/Extender: [name] in Treatment: 18 Active Problems Location of Pain Severity and Description of Pain Patient Has Paino No Site Locations Pain Management and Medication Current Pain Management: Electronic Signature(s) Signed: 09/11/2020 3:50:24 PM By: Sandre Kitty Entered By: Sandre Kitty on 09/11/2020 14:42:48 -------------------------------------------------------------------------------- Patient/Caregiver Education Details Patient Name: Date of Service: [name] 6/10/2022andnbsp2:45 PM Medical Record Number: [id number] Patient Account Number: [id number] Date of Birth/Gender: Treating RN: [birth year] (35 y.o. [name] Primary Care Physician: [name] Other Clinician: Referring Physician: Treating Physician/Extender: [name] in Treatment: 51 Education Assessment Education Provided To: Patient Education Topics Provided Elevated Blood Sugar/ Impact on Healing: Methods: Explain/Verbal Responses: State content correctly Offloading: Methods: Explain/Verbal Responses: State content correctly Wound/Skin Impairment: Methods: Explain/Verbal Responses: State content correctly Electronic Signature(s) Signed: 09/11/2020 5:10:19 PM By: [name] Entered By: [name] on 09/11/2020 14:53:38 -------------------------------------------------------------------------------- Wound Assessment Details Patient Name: Date of Service: [name], [name] 09/11/2020 2:45 PM Medical Record Number: [id number] Patient Account Number: [id number] Date of Birth/Sex: Treating RN: 13-Mar-1986 (35 y.o. M) Primary Care Hans Rusher: [name] Other Clinician: Referring Kariana Wiles: Treating Lorianna Spadaccini/Extender: [name] in Treatment: 46 Wound Status Wound Number: 3 Primary Etiology: Diabetic Wound/Ulcer of the Lower Extremity Wound Location: Left, Lateral Foot Wound Status: Open Wounding Event: Trauma Comorbid  History: Type II Diabetes Date Acquired: 10/02/2019 Weeks Of Treatment: 46 Clustered Wound: No Wound Measurements Length: (cm) 2.1 Width: (cm) 1.9 Depth: (cm) 0.4 Area: (cm) 3.134 Volume: (cm) 1.253 % Reduction in Area: -90.1% % Reduction in Volume: -659.4% Epithelialization: Small (1-33%) Tunneling: No Undermining: No Wound Description Classification: Grade 2 Wound Margin: Thickened Exudate Amount: Large Exudate Type: Serosanguineous Exudate Color: red, brown Foul Odor After Cleansing: No Slough/Fibrino No Wound Bed Granulation Amount: Large (67-100%) Exposed Structure Granulation Quality: Red Fascia Exposed: No Necrotic Amount: None Present (0%) Fat Layer (Subcutaneous Tissue) Exposed: Yes Tendon Exposed: No Muscle Exposed: No Joint Exposed: No Bone Exposed: No Treatment Notes Wound #3 (Foot) Wound Laterality: Left, Lateral Cleanser Soap and Water Discharge Instruction: May shower and wash wound with dial antibacterial soap and water prior to dressing change. Peri-Wound Care Zinc Oxide Ointment 30g tube Discharge Instruction: Apply Zinc Oxide to periwound with each dressing change as needed. Topical Primary Dressing KerraCel Ag Gelling Fiber Dressing, 2x2 in (silver alginate) Discharge Instruction: Apply silver alginate to wound bed as instructed Secondary Dressing Woven Gauze Sponge, Non-Sterile 4x4 in Discharge Instruction: Apply over primary dressing as directed. Zetuvit Plus 4x8 in Discharge Instruction: Apply over primary dressing as directed. ABD Pad, 8x10 Discharge Instruction: T down before applying kerlix. Apply over primary dressing as directed. ape Secured With [employer], 4.5x3.1 (in/yd) Discharge Instruction: Secure with Kerlix as  directed. Compression Wrap Compression Stockings Add-Ons Electronic Signature(s) Signed: 09/11/2020 6:16:40 PM By: Baruch Gouty RN, BSN Entered By: Baruch Gouty on 09/11/2020  15:11:44 -------------------------------------------------------------------------------- Wound Assessment Details Patient Name: Date of Service: [name], [name] 09/11/2020 2:45 PM Medical Record Number: [id number] Patient Account Number: [id number] Date of Birth/Sex: Treating RN: [birth year] (35 y.o. M) Primary Care Mertice Uffelman: [name] Other Clinician: Referring Cuthbert Turton: Treating Fue Cervenka/Extender: [name] in Treatment: 46 Wound Status Wound Number: 5 Primary Etiology: Diabetic Wound/Ulcer of the Lower Extremity Wound Location: Right, Lateral Metatarsal head fifth Wound Status: Open Wounding Event: Gradually Appeared Date Acquired: 07/07/2020 Weeks Of Treatment: 8 Clustered Wound: No Wound Measurements Length: (cm) 2.4 Width: (cm) 3.1 Depth: (cm) 0.5 Area: (cm) 5.843 Volume: (cm) 2.922 % Reduction in Area: 33.6% % Reduction in Volume: 33.6% Wound Description Classification: Grade 2 Treatment Notes Wound #5 (Metatarsal head fifth) Wound Laterality: Right, Lateral Cleanser Normal Saline Discharge Instruction: Cleanse the wound with Normal Saline prior to applying a clean dressing using gauze sponges, not tissue or cotton balls. Soap and Water Discharge Instruction: May shower and wash wound with dial antibacterial soap and water prior to dressing change. Peri-Wound Care Topical Primary Dressing KerraCel Ag Gelling Fiber Dressing, 2x2 in (silver alginate) Discharge Instruction: Apply silver alginate to wound bed as instructed Secondary Dressing ABD Pad, 8x10 Discharge Instruction: Apply over primary dressing as directed. Optifoam Non-Adhesive Dressing, 4x4 in Discharge Instruction: Apply over primary dressing, cut to make foam donut to offload Secured With [employer] Sterile, 4.5x3.1 (in/yd) Discharge Instruction: Secure with Kerlix as directed. 41M Medipore H Soft Cloth Surgical T ape, 2x2 (in/yd) Discharge Instruction: Secure dressing with  tape as directed. Compression Wrap Compression Stockings Add-Ons Electronic Signature(s) Signed: 09/11/2020 3:50:24 PM By: Sandre Kitty Entered By: Sandre Kitty on 09/11/2020 14:43:33 -------------------------------------------------------------------------------- Wound Assessment Details Patient Name: Date of Service: [name] 09/11/2020 2:45 PM Medical Record Number: [id number] Patient Account Number: [id number] Date of Birth/Sex: Treating RN: 1985-08-16 (35 y.o. M) Primary Care Shawnell Dykes: [name] Other Clinician: Referring Freddi Schrager: Treating Laura Caldas/Extender: [name] in Treatment: 46 Wound Status Wound Number: 7 Primary Etiology: Diabetic Wound/Ulcer of the Lower Extremity Wound Location: Right, Anterior Ankle Wound Status: Open Wounding Event: Gradually Appeared Date Acquired: 08/17/2020 Weeks Of Treatment: 3 Clustered Wound: No Wound Measurements Length: (cm) 1.7 Width: (cm) 2 Depth: (cm) 0.1 Area: (cm) 2.67 Volume: (cm) 0.267 % Reduction in Area: -17.6% % Reduction in Volume: -17.6% Wound Description Classification: Grade 2 Treatment Notes Wound #7 (Ankle) Wound Laterality: Right, Anterior Cleanser Normal Saline Discharge Instruction: Cleanse the wound with Normal Saline prior to applying a clean dressing using gauze sponges, not tissue or cotton balls. Soap and Water Discharge Instruction: May shower and wash wound with dial antibacterial soap and water prior to dressing change. Peri-Wound Care Topical Primary Dressing KerraCel Ag Gelling Fiber Dressing, 2x2 in (silver alginate) Discharge Instruction: Apply silver alginate to wound bed as instructed Secondary Dressing Woven Gauze Sponge, Non-Sterile 4x4 in Discharge Instruction: Apply over primary dressing as directed. ABD Pad, 8x10 Discharge Instruction: Apply over primary dressing as directed. Secured With [employer], 4.5x3.1 (in/yd) Discharge  Instruction: Secure with Kerlix as directed. 41M Medipore H Soft Cloth Surgical T ape, 2x2 (in/yd) Discharge Instruction: Secure dressing with tape as directed. Compression Wrap Compression Stockings Add-Ons Electronic Signature(s) Signed: 09/11/2020 3:50:24 PM By: Sandre Kitty Entered By: Sandre Kitty on 09/11/2020 14:43:33 -------------------------------------------------------------------------------- Vitals Details Patient Name: Date of Service: [name], [name]  09/11/2020 2:45 PM Medical Record Number: [id number] Patient Account Number: [id number] Date of Birth/Sex: Treating RN: 08-08-1985 (35 y.o. M) Primary Care Alick Lecomte: [name] Other Clinician: Referring Jahfari Ambers: Treating Tammi Boulier/Extender: [name] in Treatment: 16 Vital Signs Time Taken: 14:42 Temperature (F): 98.5 Height (in): 77 Pulse (bpm): 103 Weight (lbs): 280 Respiratory Rate (breaths/min): 18 Body Mass Index (BMI): 33.2 Blood Pressure (mmHg): 122/76 Capillary Blood Glucose (mg/dl): 115 Reference Range: 80 - 120 mg / dl Electronic Signature(s) Signed: 09/11/2020 3:50:24 PM By: Sandre Kitty Entered By: Sandre Kitty on 09/11/2020 14:42:37

## 2020-09-14 ENCOUNTER — Other Ambulatory Visit: Payer: Self-pay

## 2020-09-14 ENCOUNTER — Encounter (HOSPITAL_BASED_OUTPATIENT_CLINIC_OR_DEPARTMENT_OTHER): Payer: BC Managed Care – PPO | Admitting: Internal Medicine

## 2020-09-14 DIAGNOSIS — E11621 Type 2 diabetes mellitus with foot ulcer: Secondary | ICD-10-CM | POA: Diagnosis not present

## 2020-09-15 NOTE — Progress Notes (Signed)
[name], [name] ([id number]) Visit Report for 09/14/2020 Debridement Details Patient Name: Date of Service: [name] 09/14/2020 3:45 PM Medical Record Number: [id number] Patient Account Number: [id number] Date of Birth/Sex: Treating RN: 04-Apr-1986 (35 y.o. [name] Primary Care Provider: [name] Other Clinician: Referring Provider: Treating Provider/Extender: [name] in Treatment: 46 Debridement Performed for Assessment: Wound #3 Left,Lateral Foot Performed By: Physician [name], MD Debridement Type: Debridement Severity of Tissue Pre Debridement: Fat layer exposed Level of Consciousness (Pre-procedure): Awake and Alert Pre-procedure Verification/Time Out Yes - 16:35 Taken: Start Time: 16:35 Pain Control: Other : benzocaine 20% spray T Area Debrided (L x W): otal 1.8 (cm) x 1 (cm) = 1.8 (cm) Tissue and other material debrided: Non-Viable, Callus, Skin: Epidermis Level: Skin/Epidermis Debridement Description: Selective/Open Wound Instrument: Curette Bleeding: Minimum Hemostasis Achieved: Pressure Procedural Pain: 0 Post Procedural Pain: 0 Response to Treatment: Procedure was tolerated well Level of Consciousness (Post- Awake and Alert procedure): Post Debridement Measurements of Total Wound Length: (cm) 1.8 Width: (cm) 2.1 Depth: (cm) 0.3 Volume: (cm) 0.891 Character of Wound/Ulcer Post Debridement: Improved Severity of Tissue Post Debridement: Fat layer exposed Post Procedure Diagnosis Same as Pre-procedure Electronic Signature(s) Signed: 09/14/2020 5:24:16 PM By: [name] MD Signed: 09/15/2020 6:10:01 PM By: Baruch Gouty RN, BSN Entered By: [name] on 09/14/2020 17:12:50 -------------------------------------------------------------------------------- Debridement Details Patient Name: Date of Service: [name], [name] 09/14/2020 3:45 PM Medical Record Number: [id number] Patient  Account Number: [id number] Date of Birth/Sex: Treating RN: [birth year] (35 y.o. [name] Primary Care Provider: [name] Other Clinician: Referring Provider: Treating Provider/Extender: [name] in Treatment: 46 Debridement Performed for Assessment: Wound #5 Right,Lateral Metatarsal head fifth Performed By: Physician [name], MD Debridement Type: Debridement Severity of Tissue Pre Debridement: Fat layer exposed Level of Consciousness (Pre-procedure): Awake and Alert Pre-procedure Verification/Time Out Yes - 16:35 Taken: Start Time: 16:35 Pain Control: Other : benzocaine 20% spray T Area Debrided (L x W): otal 2.3 (cm) x 1 (cm) = 2.3 (cm) Tissue and other material debrided: Non-Viable, Callus, Skin: Epidermis Level: Skin/Epidermis Debridement Description: Selective/Open Wound Instrument: Curette Bleeding: Minimum Hemostasis Achieved: Pressure End Time: 16:38 Procedural Pain: 0 Post Procedural Pain: 0 Response to Treatment: Procedure was tolerated well Level of Consciousness (Post- Awake and Alert procedure): Post Debridement Measurements of Total Wound Length: (cm) 2.3 Width: (cm) 3.2 Depth: (cm) 0.4 Volume: (cm) 2.312 Character of Wound/Ulcer Post Debridement: Improved Severity of Tissue Post Debridement: Fat layer exposed Post Procedure Diagnosis Same as Pre-procedure Electronic Signature(s) Signed: 09/14/2020 5:24:16 PM By: [name] MD Signed: 09/15/2020 6:10:01 PM By: Baruch Gouty RN, BSN Entered By: [name] on 09/14/2020 17:13:01 -------------------------------------------------------------------------------- HPI Details Patient Name: Date of Service: [name], [name] 09/14/2020 3:45 PM Medical Record Number: [id number] Patient Account Number: [id number] Date of Birth/Sex: Treating RN: 10-21-1985 (35 y.o. 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 He comes in for review of a wound over the plantar fifth metatarsal head extending into the lateral part of the foot. He was followed for this previously by his podiatrist Dr. Cornelius Moras. As the patient tells his story he went to see podiatry first for a swelling he developed on the lateral part of his  fifth metatarsal head in May. He states this was "open" by podiatry and the area closed. He was followed up in June and it was again opened callus removed and it closed promptly. There were plans being made for surgery on the fifth metatarsal head in June however his blood sugar was apparently too high for anesthesia. Apparently the area was debrided and opened again in June and it is never closed since. Looking over the records from podiatry I am really not able to follow this. It was clear when he was first seen it was before 5/14 at that point he already had a wound. By 5/17 the ulcer was resolved. I do not see anything about a procedure. On 5/28 noted to have pre-ulcerative moderate keratosis. X-ray noted 1/5 contracted toe and tailor's bunion and metatarsal deformity. On a visit date on 09/28/2018 the dorsal part of the left foot it healed and resolved. There was concern about swelling in his lower extremity he was sent to the ER.. As far as I can tell he was seen in the ER on 7/12 with an ulcer on his left foot. A DVT rule out of the left leg was negative. I do not think I have complete records from podiatry but I am not able to verify the procedures this patient states he had. He states after the last procedure the wound has never closed although I am not able to follow this in the records I have from podiatry. He has not had a recent x-ray The patient has been  using Neosporin on the wound. He is wearing a Darco shoe. He is still very active up on his foot working and exercising. Past medical history; type 2 diabetes ketosis-prone, leg swelling with a negative DVT study in July. Non-smoker ABI in our clinic was 0.85 on the left 10/16; substantial wound on the plantar left fifth met head extending laterally almost to the dorsal fifth MTP. We have been using silver alginate we gave him a Darco forefoot off loader. An x-ray did not show evidence of osteomyelitis did note soft tissue emphysema which I think was due to gas tracking through an open wound. There is no doubt in my mind he requires an MRI 10/23; MRI not booked until 3 November at the earliest this is largely due to his glucose sensor in the right arm. We have been using silver alginate. There has been an improvement 10/29; I am still not exactly sure when his MRI is booked for. He says it is the third but it is the 10th in epic. This definitely needs to be done. He is running a low-grade fever today but no other symptoms. No real improvement in the 1 02/26/2019 patient presents today for a follow-up visit here in our clinic he is last been seen in the clinic on October 29. Subsequently we were working on getting MRI to evaluate and see what exactly was going on and where we would need to go from the standpoint of whether or not he had osteomyelitis and again what treatments were going be required. Subsequently the patient ended up being admitted to the hospital on 02/07/2019 and was discharged on 02/14/2019. This is a somewhat interesting admission with a discharge diagnosis of pneumonia due to COVID-19 although he was positive for COVID-19 when tested at the urgent care but negative x2 when he was actually in the hospital. With that being said he did have acute respiratory failure with hypoxia and it was noted he also  have a left foot ulceration with osteomyelitis. With that being said he did require  oxygen for his pneumonia and I level 4 L. He was placed on antivirals and steroids for the COVID-19. He was also transferred to the [facility] at one point. Nonetheless he did subsequently discharged home and since being home has done much better in that regard. The CT angiogram did not show any pulmonary embolism. With regard to the osteomyelitis the patient was placed on vancomycin and Zosyn while in the hospital but has been changed to Augmentin at discharge. It was also recommended that he follow- up with wound care and podiatry. Podiatry however wanted him to see [name] according to the patient prior to them doing anything further. His hemoglobin A1c was 9.9 as noted in the hospital. Have an MRI of the left foot performed while in the hospital on 02/04/2019. 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 There is an overlying plantar open wound noted an abscess tracking back along the lateral aspect of the fifth metatarsal shaft. There is otherwise diffuse cellulitis and mild fasciitis without findings of polymyositis. The patient did have recently pneumonia secondary to COVID-19 I looked in the chart through epic and it does appear that the patient may need to have an additional x-ray just to ensure everything is cleared and that he has no airspace disease prior to putting him into the [name]. 03/05/2019; patient was readmitted to the clinic last week. He was hospitalized twice for a viral upper respiratory tract infection from 11/1 through 11/4 and then 11/5 through 11/12 ultimately this turned out to be Covid pneumonitis. Although he was discharged on oxygen he is not using it. He says he feels fine. He has no exercise limitation no cough no sputum. His O2 sat in our clinic today was 100% on room air. He did manage to have his MRI which showed septic arthritis at the fifth MTP joint and osteomyelitis  involving the fifth metatarsal head and proximal phalanx. He received Vanco and Zosyn in the hospital and then was discharged on 2 weeks of Augmentin. I do not see any relevant cultures. He was supposed to follow-up with infectious disease but I do not see that he has an appointment. 12/8; patient saw Dr. Novella Olive of infectious disease last week. He felt that he had had adequate antibiotic therapy. He did not go to follow-up with Dr. Amalia Hailey of podiatry and I have again talked to him about the pros and cons of this. He does not want to consider a ray amputation of this time. He is aware of the risks of recurrence, migration etc. He started HBO today and tolerated this well. He can complete the Augmentin that I gave him last week. I have looked over the lab work that Dr. Chana Bode ordered his C-reactive protein was 3.3 and his sedimentation rate was 17. The C-reactive protein is never really been measurably that high in this patient 12/15; not much change in the wound today however he has undermining along the lateral part of the foot again more extensively than last week. He has some rims of epithelialization. We have been using silver alginate. He is undergoing hyperbarics but did not dive today 12/18; in for his obligatory first total contact cast change. Unfortunately there was pus coming from the undermining area around his fifth metatarsal head. This was cultured but will preclude reapplication of a cast. He is  seen in conjunction with HBO 12/24; patient had staph lugdunensis in the wound in the undermining area laterally last time. We put him on doxycycline which should have covered this. The wound looks better today. I am going to give him another week of doxycycline before reattempting the total contact cast 12/31; the patient is completing antibiotics. Hemorrhagic debris in the distal part of the wound with some undermining distally. He also had hyper granulation. Extensive debridement with a #5  curette. The infected area that was on the lateral part of the fifth met head is closed over. I do not think he needs any more antibiotics. Patient was seen prior to HBO. Preparations for a total contact cast were made in the cast will be placed post hyperbarics 04/11/19; once again the patient arrives today without complaint. He had been in a cast all week noted that he had heavy drainage this week. This resulted in large raised areas of macerated tissue around the wound 1/14; wound bed looks better slightly smaller. Hydrofera Blue has been changing himself. He had a heavy drainage last week which caused a lot of maceration around the wound so I took him out of a total contact cast he says the drainage is actually better this week He is seen today in conjunction with HBO 1/21; returns to clinic. He was up in [place] for a day or 2 attending a funeral. He comes back in with the wound larger and with a large area of exposed bone. He had osteomyelitis and septic arthritis of the fifth left metatarsal head while he was in hospital. He received IV antibiotics in the hospital for a prolonged period of time then 3 weeks of Augmentin. Subsequently I gave him 2 weeks of doxycycline for more superficial wound infection. When I saw this last week the wound was smaller the surface of the wound looks satisfactory. 1/28; patient missed hyperbarics today. Bone biopsy I did last time showed Enterococcus faecalis and Staphylococcus lugdunensis . He has a wide area of exposed bone. We are going to use silver alginate as of today. I had another ethical discussion with the patient. 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 In this situation I think the likelihood of healing this is low. Therefore I have recommended a ray amputation and with the patient's agreement I have referred him to Dr. Doran Durand. The other issue is that his compliance with hyperbarics has been minimal because of his  work schedule and given his underlying decision I am going to stop this today READMISSION 10/24/2019 MRI 09/29/2019 left foot IMPRESSION: 1. Apparent skin ulceration inferior and lateral to the 5th metatarsal base with underlying heterogeneous T2 signal and enhancement in the subcutaneous fat. Small peripherally enhancing fluid collections along the plantar and lateral aspects of the 5th metatarsal base suspicious for abscesses. 2. Interval amputation through the mid 5th metatarsal with nonspecific low-level marrow edema and enhancement. Given the proximity to the adjacent soft tissue inflammatory changes, osteomyelitis cannot be excluded. 3. The additional bones appear unremarkable. MRI 09/29/2019 right foot IMPRESSION: 1. Soft tissue ulceration lateral to the 5th MTP joint. There is low-level T2 hyperintensity within the 4th and 5th metatarsal heads and adjacent proximal phalanges without abnormal T1 signal or cortical destruction. These findings are nonspecific and could be seen with early marrow edema, hyperemia or early osteomyelitis. No evidence of septic joint. 2. Mild tenosynovitis and synovial enhancement associated with the extensor digitorum tendons at the level of the midfoot. 3. Diffuse  low-level muscular T2 hyperintensity and enhancement, most consistent with diabetic myopathy. LEFT FOOT BONE Methicillin resistant staphylococcus aureus Staphylococcus lugdunensis MIC MIC CIPROFLOXACIN >=8 RESISTANT Resistant <=0.5 SENSI... Sensitive CLINDAMYCIN <=0.25 SENS... Sensitive >=8 RESISTANT Resistant ERYTHROMYCIN >=8 RESISTANT Resistant >=8 RESISTANT Resistant GENTAMICIN <=0.5 SENSI... Sensitive <=0.5 SENSI... Sensitive Inducible Clindamycin NEGATIVE Sensitive NEGATIVE Sensitive OXACILLIN >=4 RESISTANT Resistant 2 SENSITIVE Sensitive RIFAMPIN <=0.5 SENSI... Sensitive <=0.5 SENSI... Sensitive TETRACYCLINE <=1 SENSITIVE Sensitive <=1 SENSITIVE Sensitive TRIMETH/SULFA <=10  SENSIT Sensitive <=10 SENSIT Sensitive ... Marland Kitchen.. VANCOMYCIN 1 SENSITIVE Sensitive <=0.5 SENSI... Sensitive Right foot bone . Component 3 wk ago Specimen Description BONE Special Requests RIGHT 4 METATARSAL SAMPLE B Gram Stain NO WBC SEEN NO ORGANISMS SEEN Culture RARE METHICILLIN RESISTANT STAPHYLOCOCCUS AUREUS NO ANAEROBES ISOLATED Performed at [facility], [place] [address] [place], [place] [address] Report Status 10/08/2019 FINAL Organism ID, Bacteria METHICILLIN RESISTANT STAPHYLOCOCCUS AUREUS Resulting Agency [facility] Susceptibility Methicillin resistant staphylococcus aureus MIC CIPROFLOXACIN >=8 RESISTANT Resistant CLINDAMYCIN <=0.25 SENS... Sensitive ERYTHROMYCIN >=8 RESISTANT Resistant GENTAMICIN <=0.5 SENSI... Sensitive Inducible Clindamycin NEGATIVE Sensitive OXACILLIN >=4 RESISTANT Resistant RIFAMPIN <=0.5 SENSI... Sensitive TETRACYCLINE <=1 SENSITIVE Sensitive TRIMETH/SULFA <=10 SENSIT Sensitive ... VANCOMYCIN 1 SENSITIVE Sensitive This is a patient we had in clinic earlier this year with a wound over his left fifth metatarsal head. 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 Doran Durand on 05/16/2019. He seems to have developed problems with wounds on his bilateral feet in June 2021 although he may have had problems earlier than this. He was in an urgent care with a right foot ulcer on 09/26/2019 and given a course of doxycycline. This was apparently after having trouble getting into see orthopedics. He was seen by podiatry on 09/28/2019 noted to have bilateral lower extremity ulcers including the left lateral fifth metatarsal base and the right subfifth met head. It was noted that  had purulent drainage at that time. He required hospitalization from 6/20 through 7/2. This was because of worsening right foot wounds. He underwent bilateral operative incision and drainage and bone biopsies bilaterally. Culture results are listed above. He has been referred back to clinic by Dr. Jacqualyn Posey of podiatry. He is also followed by Dr. Megan Salon who saw him yesterday. He was discharged from hospital on Zyvox Flagyl and Levaquin and yesterday changed to doxycycline Flagyl and Levaquin. His inflammatory markers on 6/26 showed a sedimentation rate of 129 and a C-reactive protein of 5. This is improved to 14 and 1.3 respectively. This would indicate improvement. ABIs in our clinic today were 1.23 on the right and 1.20 on the left 11/01/2019 on evaluation today patient appears to be doing fairly well in regard to the wounds on his feet at this point. Fortunately there is no signs of active infection at this time. No fevers, chills, nausea, vomiting, or diarrhea. He currently is seeing infectious disease and still under their care at this point. Subsequently he also has both wounds which she has not been using collagen on as he did not receive that in his packaging he did not call us and let us know that. Apparently that just was missed on the order. Nonetheless we will get that straightened out today. 8/9-Patient returns for bilateral foot wounds, using Prisma with hydrogel moistened  dressings, and the wounds appear stable. Patient using surgical shoes, avoiding much pressure or weightbearing as much as possible 8/16; patient has bilateral foot wounds. 1 on the right lateral foot proximally the other is on the left mid lateral foot. Both required debridement of callus and thick skin around the wounds. We have been using silver collagen 8/27; patient has bilateral lateral foot wounds. The area on the left substantially surrounded by callus and dry skin. This was removed from the wound edge. The  underlying wound is small. The area on the right measured somewhat smaller today. 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 Unfortunately I did not update his antibiotics during today's visit. 9/10 I reviewed Dr. Hale Bogus last notes he felt he had completed antibiotics his inflammatory markers were reasonably well controlled. He has a small wound on the lateral left foot and a tiny area on the right which is just above closed. He is using Hydrofera Blue with border foam he has bilateral surgical shoes 9/24; 2 week f/u. doing well. right foot is closed. left foot still undermined. 10/14; right foot remains closed at the fifth met head. The area over the base of the left fifth metatarsal has a small open area but considerable undermining towards the plantar foot. Thick callus skin around this suggests an adequate pressure relief. We have talked about this. He says he is going to go back into his cam boot. I suggested a total contact cast he did not seem enamored with this suggestion 10/26; left foot base of the fifth metatarsal. Same condition as last time. He has skin over the area with an open wound however the skin is not adherent. He went to see Dr. Earleen Newport who did an x-ray and culture of his foot I have not reviewed the x-ray but the patient was not told anything. He is on doxycycline 11/11; since the patient was last here he was in the emergency room on 10/30 he was concerned about swelling in the left foot. They did not do any cultures or x-rays. They changed his antibiotics to cephalexin. Previous culture showed group B strep. The cephalexin is appropriate as doxycycline has less than predictable coverage. Arrives in clinic today with swelling over this area under the wound. He also has a new wound on the right fifth metatarsal head 11/18; the patient has a difficult wound on the lateral aspect of the left fifth metatarsal head.  The wound was almost ballotable last week I opened it slightly expecting to see purulence however there was just bleeding. I cultured this this was negative. X-ray unchanged. We are trying to get an MRI but I am not sure were going to be able to get this through his insurance. He also has an area on the right lateral fifth metatarsal head this looks healthier 12/3; the patient finally got our MRI. Surprisingly this did not show osteomyelitis. I did show the soft tissue ulceration at the lateral plantar aspect of the fifth metatarsal base with a tiny residual 6 mm abscess overlying the superficial fascia I have tried to culture this area I have not been able to get this to grow anything. Nevertheless the protruding tissue looks aggravated. I suspect we should try to treat the underlying "abscess with broad-spectrum antibiotics. I am going to start him on Levaquin and Flagyl. He has much less edema in his legs and I am going to continue to wrap his legs and see  him weekly 12/10. I started Levaquin and Flagyl on him last week. He just picked up the Flagyl apparently there was some delay. The worry is the wound on the left fifth metatarsal base which is substantial and worsening. His foot looks like he inverts at the ankle making this a weightbearing surface. Certainly no improvement in fact I think the measurements of this are somewhat worse. We have been using 12/17; he apparently just got the Levaquin yesterday this is 2 weeks after the fact. He has completed the Flagyl. The area over the left fifth metatarsal base still has protruding granulation tissue although it does not look quite as bad as it did some weeks ago. He has severe bilateral lymphedema although we have not been treating him for wounds on his legs this is definitely going to require compression. There was so much edema in the left I did not wish to put him in a total contact cast today. I am going to increase his compression from 3-4  layer. The area on the right lateral fifth met head actually look quite good and superficial. 12/23; patient arrived with callus on the right fifth met head and the substantial hyper granulated callused wound on the base of his fifth metatarsal. He says he is completing his Levaquin in 2 days but I do not think that adds up with what I gave him but I will have to double check this. We are using Hydrofera Blue on both areas. My plan is to put the left leg in a cast the week after New Year's 04/06/2020; patient's wounds about the same. Right lateral fifth metatarsal head and left lateral foot over the base of the fifth metatarsal. There is undermining on the left lateral foot which I removed before application of total contact cast continuing with Hydrofera Blue new. Patient tells me he was seen by endocrinology today lab work was done [Dr. Kerr]. Also wondering whether he was referred to cardiology. I went over some lab work from previously does not have chronic renal failure certainly not nephrotic range proteinuria he does have very poorly controlled diabetes but this is not his most updated lab work. Hemoglobin A1c has been over 11 1/10; the patient had a considerable amount of leakage towards mid part of his left foot with macerated skin however the wound surface looks better the area on the right lateral fifth met head is better as well. I am going to change the dressing on the left foot under the total contact cast to silver alginate, continue with Hydrofera Blue on the right. 1/20; patient was in the total contact cast for 10 days. Considerable amount of drainage although the skin around the wound does not look too bad on the left foot. The area on the right fifth metatarsal head is closed. Our nursing staff reports large amount of drainage out of the left lateral foot wound 1/25; continues with copious amounts of drainage described by our intake staff. PCR culture I did last week showed E. coli  and Enterococcus faecalis and low quantities. Multiple resistance genes documented including extended spectrum beta lactamase, MRSA, MRSE, quinolone, tetracycline. The wound is not quite as good this week as it was 5 days ago but about the same size 2/3; continues with copious amounts of malodorous drainage per our intake nurse. The PCR culture I did 2 weeks ago showed E. coli and low quantities of Enterococcus. There were multiple resistance genes detected. I put Neosporin on him last week although this  does not seem to have helped. The wound is slightly deeper today. Offloading continues to be an issue here although with the amount of drainage she has a total contact cast is just not going to work 2/10; moderate amount of drainage. Patient reports he cannot get his stocking on over the dressing. I told him we have to do that the nurse gave him suggestions on how to make this work. The wound is on the bottom and lateral part of his left foot. Is cultured predominantly grew low amounts of Enterococcus, E. coli and anaerobes. There were multiple resistance genes detected including extended spectrum beta lactamase, quinolone, tetracycline. I could not think of an easy oral combination to address this so for now I am going to do topical antibiotics provided by [facility] I think the main agents here are vancomycin and an aminoglycoside. We have to be able to give him access to the wounds to get the topical antibiotic on 2/17; moderate amount of drainage this is unchanged. He has his Keystone topical antibiotic against the deep tissue culture organisms. He has been using this and changing the dressing daily. Silver alginate on the wound surface. 2/24; using Keystone antibiotic with silver alginate on the top. He had too much drainage for a total contact cast at one point although I think that is improving and I think in the next week or 2 it might be possible to replace a total contact cast I did not do this  today. In general the wound surface looks healthy however he continues to have thick rims of skin and subcutaneous tissue around the wide area of the circumference which I debrided 06/04/2020 upon evaluation today patient appears to be doing well in regard to his wound. I do feel like he is showing signs of improvement. There is little bit of callus and dead tissue around the edges of the wound as well as what appears to be a little bit of a sinus tract that is off to the side laterally I would perform debridement to clear that away today. 3/17; left lateral foot. The wound looks about the same as I remember. Not much depth surface looks healthy. No evidence of infection 3/25; left lateral foot. Wound surface looks about the same. Separating epithelium from the circumference. There really is no evidence of infection here however not making progress by my view 3/29; left lateral foot. Surface of the wound again looks reasonably healthy still thick skin and subcutaneous tissue around the wound margins. There is no evidence of infection. One of the concerns being brought up by the nurses has again the amount of drainage vis--vis continued use of a total contact cast 4/5; left lateral foot at roughly the base of the fifth metatarsal. Nice healthy looking granulated tissue with rims of epithelialization. The overall wound measurements are not any better but the tissue looks healthy. The only concern is the amount of drainage although he has no surrounding maceration with what we have been doing recently to absorb fluid and protect his skin. He also has lymphedema. 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 He states that the kerlix/coban under the TCC rolled down  and could not get it back up. He has been using  an offloading scooter and has somehow hurt his right foot using this device. This happened last week. He states that the side of his right foot developed a blister and opened. The top of his foot also has a few small open wounds he thinks is due to his socks rubbing in his shoes. He has not been using any dressings to the wound. He denies purulent drainage, fever/chills or erythema to the wounds. 4/22; patient presents for 1 week follow-up. He developed new wounds to the right foot that were evaluated at last clinic visit. He continues to have a total contact cast to the left leg and he reports no issues. He has been using silver collagen to the right foot wounds with no issues. He denies purulent drainage, fever/chills or erythema to the right foot wounds. He has no complaints today 4/25; patient presents for 1 week follow-up. He has a total contact cast of the left leg and reports no issues. He has been using silver alginate to the right foot wound. He denies purulent drainage, fever/chills or erythema to the right foot wounds. 5/2 patient presents for 1 week follow-up. T contact cast on the left. The wound which is on the base of the plantar foot at the base of the fifth metatarsal otal actually looks quite good and dimensions continue to gradually contract. HOWEVER the area on the right lateral fifth metatarsal head is much larger than what I remember from 2 weeks ago. Once more is he has significant levels of hypergranulation. Noteworthy that he had this same hyper granulated response on his wound on the left foot at one point in time. So much so that he I thought there was an underlying fluid collection. Based on this I think this just needs debridement. 5/9; the wound on the left actually continues to be gradually smaller with a healthy surface. Slight amount of drainage and maceration of the skin around but not too bad. However he has a large  wound over the right fifth metatarsal head very much in the same configuration as his left foot wound was initially. I used silver nitrate to address the hyper granulated tissue no mechanical debridement 5/16; area on the left foot did not look as healthy this week deeper thick surrounding macerated skin and subcutaneous tissue. The area on the right foot fifth met head was about the same The area on the right ankle that we identified last week is completely broken down into an open wound presumably a stocking rubbing issue 5/23; patient has been using a total contact cast to the left side. He has been using silver alginate underneath. He has also been using silver alginate to the right foot wounds. He has no complaints today. He denies any signs of infection. 5/31; the left-sided wound looks some better measure smaller surface granulation looks better. We have been using silver alginate under the total contact cast The large area on his right fifth met head and right dorsal foot look about the same still using silver alginate 6/6; neither side is good as I was hoping although the surface area dimensions are better. A lot of maceration on his left and right foot around the wound edge. Area on the dorsal right foot looks better. He says he was traveling. I am not sure what does the amount of maceration around the plantar wounds may be drainage issues 6/13; in general the wound surfaces look quite good on both sides. Macerated skin and raised edges around the wound required  debridement although in general especially on the left the surface area seems improved. The area on the right dorsal ankle is about the same I thought this would not be such a problem to close Electronic Signature(s) Signed: 09/14/2020 5:24:16 PM By: [name] MD Entered By: [name] on 09/14/2020 17:14:03 -------------------------------------------------------------------------------- Physical Exam Details Patient  Name: Date of Service: [name], [name] 09/14/2020 3:45 PM Medical Record Number: [id number] Patient Account Number: [id number] Date of Birth/Sex: Treating RN: 02/13/1986 (35 y.o. [name] Primary Care Provider: [name] Other Clinician: Referring Provider: Treating Provider/Extender: [name] in Treatment: 46 Constitutional Sitting or standing Blood Pressure is within target range for patient.. Pulse regular and within target range for patient.Marland Kitchen Respirations regular, non-labored and within target range.. Temperature is normal and within the target range for the patient.Marland Kitchen Appears in no distress. Cardiovascular Pedal pulses palpable and strong bilaterally.. Significant lymphedema in both lower legs. Notes Wound exam; left lateral foot a lot of maceration. I removed macerated skin from around the circumference of both wounds. Fortunately the surfaces continue to look really quite good and I believe the condition of both wounds is improved over time. Frustratingly the area on the right dorsal ankle is not nearly as good as I had hoped No evidence of infection in any wound area Electronic Signature(s) Signed: 09/14/2020 5:24:16 PM By: [name] MD Entered By: [name] on 09/14/2020 17:15:29 -------------------------------------------------------------------------------- Physician Orders Details Patient Name: Date of Service: [name], [name] 09/14/2020 3:45 PM Medical Record Number: [id number] Patient Account Number: [id number] Date of Birth/Sex: Treating RN: 1985/08/17 (35 y.o. 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 - ***60 minutes for cast*** - with Dr. Dellia Nims Return A Bathing/ Shower/ Hygiene May shower with protection but do not get wound dressing(s) wet. - use a cast protector to left leg. Edema Control - Lymphedema / SCD / Other Bilateral Lower Extremities Elevate legs to the level of the heart or above for 30 minutes daily and/or when sitting, a frequency of: - throughout the day Avoid standing for long periods of time. Exercise regularly Moisturize legs daily. - right leg every night before bed. Compression stocking or Garment 20-30 mm/Hg pressure to: - Apply to right leg in the morning and remove at night. Off-Loading Total Contact Cast to Left Lower Extremity Open toe surgical shoe to: - right foot Wound Treatment Wound #3 - Foot Wound Laterality: Left, Lateral Cleanser: Soap and Water 1 x Per Week/30 Days Discharge Instructions: May shower and wash wound with dial antibacterial soap and water prior to dressing change. Peri-Wound Care: Zinc Oxide Ointment 30g tube 1 x Per Week/30 Days Discharge Instructions: Apply Zinc Oxide to periwound with each dressing change as needed. Prim Dressing: KerraCel Ag Gelling Fiber Dressing, 2x2 in (silver alginate) 1 x Per Week/30 Days ary Discharge Instructions: Apply silver alginate to wound bed as instructed Secondary Dressing: Woven Gauze Sponge, Non-Sterile 4x4 in 1 x Per Week/30 Days Discharge Instructions: Apply over primary dressing as directed. Secondary  Dressing: ABD Pad, 8x10 1 x Per Week/30 Days Discharge Instructions: T down before applying kerlix. Apply over primary dressing as directed. ape Secondary Dressing: Zetuvit Plus 4x8 in 1 x Per Week/30 Days Discharge Instructions: Apply over  primary dressing as directed. Secured With: [employer], 4.5x3.1 (in/yd) 1 x Per Week/30 Days Discharge Instructions: Secure with Kerlix as directed. Compression Wrap: Kerlix Roll 4.5x3.1 (in/yd) 1 x Per Week/30 Days Discharge Instructions: Apply Kerlix and Coban compression under TCC. Compression Wrap: Coban Self-Adherent Wrap 4x5 (in/yd) 1 x Per Week/30 Days Discharge Instructions: Apply over Kerlix as directed. Wound #5 - Metatarsal head fifth Wound Laterality: Right, Lateral Cleanser: Normal Saline (Generic) Every Other Day/15 Days Discharge Instructions: Cleanse the wound with Normal Saline prior to applying a clean dressing using gauze sponges, not tissue or cotton balls. Cleanser: Soap and Water Every Other Day/15 Days Discharge Instructions: May shower and wash wound with dial antibacterial soap and water prior to dressing change. Prim Dressing: KerraCel Ag Gelling Fiber Dressing, 2x2 in (silver alginate) (Generic) Every Other Day/15 Days ary Discharge Instructions: Apply silver alginate to wound bed as instructed Secondary Dressing: ABD Pad, 8x10 (Generic) Every Other Day/15 Days Discharge Instructions: Apply over primary dressing as directed. Secondary Dressing: Optifoam Non-Adhesive Dressing, 4x4 in (Generic) Every Other Day/15 Days Discharge Instructions: Apply over primary dressing, cut to make foam donut to offload Secured With: Kerlix Roll Sterile, 4.5x3.1 (in/yd) (Generic) Every Other Day/15 Days Discharge Instructions: Secure with Kerlix as directed. Secured With: 84M Medipore H Soft Cloth Surgical Tape, 2x2 (in/yd) (Generic) Every Other Day/15 Days Discharge Instructions: Secure dressing with tape as directed. Wound #7 - Ankle Wound Laterality: Right, Anterior Cleanser: Normal Saline (Generic) Every Other Day/15 Days Discharge Instructions: Cleanse the wound with Normal Saline prior to applying a clean dressing using gauze sponges, not tissue or cotton  balls. Cleanser: Soap and Water Every Other Day/15 Days Discharge Instructions: May shower and wash wound with dial antibacterial soap and water prior to dressing change. Prim Dressing: KerraCel Ag Gelling Fiber Dressing, 2x2 in (silver alginate) (Generic) Every Other Day/15 Days ary Discharge Instructions: Apply silver alginate to wound bed as instructed Secondary Dressing: Woven Gauze Sponge, Non-Sterile 4x4 in (Generic) Every Other Day/15 Days Discharge Instructions: Apply over primary dressing as directed. Secondary Dressing: ABD Pad, 8x10 (Generic) Every Other Day/15 Days Discharge Instructions: Apply over primary dressing as directed. Secured With: [employer], 4.5x3.1 (in/yd) (Generic) Every Other Day/15 Days Discharge Instructions: Secure with Kerlix as directed. Secured With: 84M Medipore H Soft Cloth Surgical Tape, 2x2 (in/yd) (Generic) Every Other Day/15 Days Discharge Instructions: Secure dressing with tape as directed. Electronic Signature(s) Signed: 09/14/2020 5:24:16 PM By: [name] MD Signed: 09/15/2020 6:10:01 PM By: Baruch Gouty RN, BSN Entered By: Baruch Gouty on 09/14/2020 16:44:47 -------------------------------------------------------------------------------- Problem List Details Patient Name: Date of Service: [name], [name] 09/14/2020 3:45 PM Medical Record Number: [id number] Patient Account Number: [id number] Date of Birth/Sex: Treating RN: 1985/11/14 (35 y.o. 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  08/10/2020 No Yes Inactive Problems ICD-10 Code Description Active Date Inactive Date L97.518 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ity 10/24/2019 10/24/2019 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, right ankle and foot 10/24/2019 10/24/2019 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o. 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 He comes in for review of a wound over the plantar fifth metatarsal head extending into the lateral part of the foot. He was followed for this previously by his podiatrist Dr. Cornelius Moras. As the patient tells his story he went to see podiatry first for a swelling he developed on the lateral part of his fifth metatarsal head in May. He states this was "open" by podiatry and the area closed. He was followed up in June and it was again opened callus removed and it closed promptly. There were plans being made for surgery on the  fifth metatarsal head in June however his blood sugar was apparently too high for anesthesia. Apparently the area was debrided and opened again in June and it is never closed since. Looking over the records from podiatry I am really not able to follow this. It was clear when he was first seen it was before 5/14 at that point he already had a wound. By 5/17 the ulcer was resolved. I do not see anything about a procedure. On 5/28 noted to have pre-ulcerative moderate keratosis. X-ray noted 1/5 contracted toe and tailor's bunion and metatarsal deformity. On a visit date on 09/28/2018 the dorsal part of the left foot it healed and resolved. There was concern about swelling in his lower extremity he was sent to the ER.. As far as I can tell he was seen in the ER on 7/12 with an ulcer on his left foot. A DVT rule out of the left leg was negative. I do not think I have complete records from podiatry but I am not able to verify the procedures this patient states he had. He states after the last procedure the wound has never closed although I am not able to follow this in the records I have from podiatry. He has not had a recent x-ray The patient has been using Neosporin on the wound. He is wearing a Darco shoe. He is still very active up on his foot working and exercising. Past medical history; type 2 diabetes ketosis-prone, leg swelling with a negative DVT study in July. Non-smoker ABI in our clinic was 0.85 on the left 10/16; substantial wound on the plantar left fifth met head extending laterally almost to the dorsal fifth MTP. We have been using silver alginate we gave him a Darco forefoot off loader. An x-ray did not show evidence of osteomyelitis did note soft tissue emphysema which I think was due to gas tracking through an open wound. There is no doubt in my mind he requires an MRI 10/23;  MRI not booked until 3 November at the earliest this is largely due to his glucose sensor in the right arm. We have  been using silver alginate. There has been an improvement 10/29; I am still not exactly sure when his MRI is booked for. He says it is the third but it is the 10th in epic. This definitely needs to be done. He is running a low-grade fever today but no other symptoms. No real improvement in the 1 02/26/2019 patient presents today for a follow-up visit here in our clinic he is last been seen in the clinic on October 29. Subsequently we were working on getting MRI to evaluate and see what exactly was going on and where we would need to go from the standpoint of whether or not he had osteomyelitis and again what treatments were going be required. Subsequently the patient ended up being admitted to the hospital on 02/07/2019 and was discharged on 02/14/2019. This is a somewhat interesting admission with a discharge diagnosis of pneumonia due to COVID-19 although he was positive for COVID-19 when tested at the urgent care but negative x2 when he was actually in the hospital. With that being said he did have acute respiratory failure with hypoxia and it was noted he also have a left foot ulceration with osteomyelitis. With that being said he did require oxygen for his pneumonia and I level 4 L. He was placed on antivirals and steroids for the COVID-19. He was also transferred to the [place] at one point. Nonetheless he did subsequently discharged home and since being home has done much better in that regard. The CT angiogram did not show any pulmonary embolism. With regard to the osteomyelitis the patient was placed on vancomycin and Zosyn while in the hospital but has been changed to Augmentin at discharge. It was also recommended that he follow- up with wound care and podiatry. Podiatry however wanted him to see [name] according to the patient prior to them doing anything further. His hemoglobin A1c was 9.9 as noted in the hospital. Have an MRI of the left foot performed while in the hospital on  02/04/2019. 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 There is an overlying plantar open wound noted an abscess tracking back along the lateral aspect of the fifth metatarsal shaft. There is otherwise diffuse cellulitis and mild fasciitis without findings of polymyositis. The patient did have recently pneumonia secondary to COVID-19 I looked in the chart through epic and it does appear that the patient may need to have an additional x-ray just to ensure everything is cleared and that he has no airspace disease prior to putting him into the [name]. 03/05/2019; patient was readmitted to the clinic last week. He was hospitalized twice for a viral upper respiratory tract infection from 11/1 through 11/4 and then 11/5 through 11/12 ultimately this turned out to be Covid pneumonitis. Although he was discharged on oxygen he is not using it. He says he feels fine. He has no exercise limitation no cough no sputum. His O2 sat in our clinic today was 100% on room air. 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 He received Vanco and Zosyn in the hospital and then was discharged on 2 weeks of Augmentin. I do not see any relevant cultures. He was supposed  to follow-up with infectious disease but I do not see that he has an appointment. 12/8; patient saw Dr. Novella Olive of infectious disease last week. He felt that he had had adequate antibiotic therapy. He did not go to follow-up with Dr. Amalia Hailey of podiatry and I have again talked to him about the pros and cons of this. He does not want to consider a ray amputation of this time. He is aware of the risks of recurrence, migration etc. He started HBO today and tolerated this well. He can complete the Augmentin that I gave him last week. I have looked over the lab  work that Dr. Chana Bode ordered his C-reactive protein was 3.3 and his sedimentation rate was 17. The C-reactive protein is never really been measurably that high in this patient 12/15; not much change in the wound today however he has undermining along the lateral part of the foot again more extensively than last week. He has some rims of epithelialization. We have been using silver alginate. He is undergoing hyperbarics but did not dive today 12/18; in for his obligatory first total contact cast change. Unfortunately there was pus coming from the undermining area around his fifth metatarsal head. This was cultured but will preclude reapplication of a cast. He is seen in conjunction with HBO 12/24; patient had staph lugdunensis in the wound in the undermining area laterally last time. We put him on doxycycline which should have covered this. The wound looks better today. I am going to give him another week of doxycycline before reattempting the total contact cast 12/31; the patient is completing antibiotics. Hemorrhagic debris in the distal part of the wound with some undermining distally. He also had hyper granulation. Extensive debridement with a #5 curette. The infected area that was on the lateral part of the fifth met head is closed over. I do not think he needs any more antibiotics. Patient was seen prior to HBO. Preparations for a total contact cast were made in the cast will be placed post hyperbarics 04/11/19; once again the patient arrives today without complaint. He had been in a cast all week noted that he had heavy drainage this week. This resulted in large raised areas of macerated tissue around the wound 1/14; wound bed looks better slightly smaller. Hydrofera Blue has been changing himself. He had a heavy drainage last week which caused a lot of maceration around the wound so I took him out of a total contact cast he says the drainage is actually better this week He is seen today in  conjunction with HBO 1/21; returns to clinic. He was up in [place] for a day or 2 attending a funeral. He comes back in with the wound larger and with a large area of exposed bone. He had osteomyelitis and septic arthritis of the fifth left metatarsal head while he was in hospital. He received IV antibiotics in the hospital for a prolonged period of time then 3 weeks of Augmentin. Subsequently I gave him 2 weeks of doxycycline for more superficial wound infection. When I saw this last week the wound was smaller the surface of the wound looks satisfactory. 1/28; patient missed hyperbarics today. Bone biopsy I did last time showed Enterococcus faecalis and Staphylococcus lugdunensis . He has a wide area of exposed bone. We are going to use silver alginate as of today. I had another ethical discussion with the patient. 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 In this situation I think the  likelihood of healing this is low. Therefore I have recommended a ray amputation and with the patient's agreement I have referred him to Dr. Doran Durand. The other issue is that his compliance with hyperbarics has been minimal because of his work schedule and given his underlying decision I am going to stop this today READMISSION 10/24/2019 MRI 09/29/2019 left foot IMPRESSION: 1. Apparent skin ulceration inferior and lateral to the 5th metatarsal base with underlying heterogeneous T2 signal and enhancement in the subcutaneous fat. Small peripherally enhancing fluid collections along the plantar and lateral aspects of the 5th metatarsal base suspicious for abscesses. 2. Interval amputation through the mid 5th metatarsal with nonspecific low-level marrow edema and enhancement. Given the proximity to the adjacent soft tissue inflammatory changes, osteomyelitis cannot be excluded. 3. The additional bones appear unremarkable. MRI 09/29/2019 right foot IMPRESSION: 1. Soft tissue  ulceration lateral to the 5th MTP joint. There is low-level T2 hyperintensity within the 4th and 5th metatarsal heads and adjacent proximal phalanges without abnormal T1 signal or cortical destruction. These findings are nonspecific and could be seen with early marrow edema, hyperemia or early osteomyelitis. No evidence of septic joint. 2. Mild tenosynovitis and synovial enhancement associated with the extensor digitorum tendons at the level of the midfoot. 3. Diffuse low-level muscular T2 hyperintensity and enhancement, most consistent with diabetic myopathy. LEFT FOOT BONE Methicillin resistant staphylococcus aureus Staphylococcus lugdunensis MIC MIC CIPROFLOXACIN >=8 RESISTANT Resistant <=0.5 SENSI... Sensitive CLINDAMYCIN <=0.25 SENS... Sensitive >=8 RESISTANT Resistant ERYTHROMYCIN >=8 RESISTANT Resistant >=8 RESISTANT Resistant GENTAMICIN <=0.5 SENSI... Sensitive <=0.5 SENSI... Sensitive Inducible Clindamycin NEGATIVE Sensitive NEGATIVE Sensitive OXACILLIN >=4 RESISTANT Resistant 2 SENSITIVE Sensitive RIFAMPIN <=0.5 SENSI... Sensitive <=0.5 SENSI... Sensitive TETRACYCLINE <=1 SENSITIVE Sensitive <=1 SENSITIVE Sensitive TRIMETH/SULFA <=10 SENSIT Sensitive <=10 SENSIT Sensitive ... Marland Kitchen.. VANCOMYCIN 1 SENSITIVE Sensitive <=0.5 SENSI... Sensitive Right foot bone . Component 3 wk ago Specimen Description BONE Special Requests RIGHT 4 METATARSAL SAMPLE B Gram Stain NO WBC SEEN NO ORGANISMS SEEN Culture RARE METHICILLIN RESISTANT STAPHYLOCOCCUS AUREUS NO ANAEROBES ISOLATED Performed at [facility], [place] [address] [place],  [id number] Report Status 10/08/2019 FINAL Organism ID, Bacteria METHICILLIN RESISTANT STAPHYLOCOCCUS AUREUS Resulting Agency [facility] Susceptibility Methicillin resistant staphylococcus aureus MIC CIPROFLOXACIN >=8 RESISTANT Resistant CLINDAMYCIN <=0.25 SENS... Sensitive ERYTHROMYCIN >=8 RESISTANT Resistant GENTAMICIN <=0.5 SENSI...  Sensitive Inducible Clindamycin NEGATIVE Sensitive OXACILLIN >=4 RESISTANT Resistant RIFAMPIN <=0.5 SENSI... Sensitive TETRACYCLINE <=1 SENSITIVE Sensitive TRIMETH/SULFA <=10 SENSIT Sensitive ... VANCOMYCIN 1 SENSITIVE Sensitive This is a patient we had in clinic earlier this year with a wound over his left fifth metatarsal head. 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 Doran Durand on 05/16/2019. He seems to have developed problems with wounds on his bilateral feet in June 2021 although he may have had problems earlier than this. He was in an urgent care with a right foot ulcer on 09/26/2019 and given a course of doxycycline. This was apparently after having trouble getting into see orthopedics. He was seen by podiatry on 09/28/2019 noted to have bilateral lower extremity ulcers including the left lateral fifth metatarsal base and the right subfifth met head. It was noted that had purulent drainage at that time. He required hospitalization from 6/20 through 7/2. This was because of worsening right  foot wounds. He underwent bilateral operative incision and drainage and bone biopsies bilaterally. Culture results are listed above. He has been referred back to clinic by Dr. Jacqualyn Posey of podiatry. He is also followed by Dr. Megan Salon who saw him yesterday. He was discharged from hospital on Zyvox Flagyl and Levaquin and yesterday changed to doxycycline Flagyl and Levaquin. His inflammatory markers on 6/26 showed a sedimentation rate of 129 and a C-reactive protein of 5. This is improved to 14 and 1.3 respectively. This would indicate improvement. ABIs in our clinic today were 1.23 on the right and 1.20 on the left 11/01/2019 on  evaluation today patient appears to be doing fairly well in regard to the wounds on his feet at this point. Fortunately there is no signs of active infection at this time. No fevers, chills, nausea, vomiting, or diarrhea. He currently is seeing infectious disease and still under their care at this point. Subsequently he also has both wounds which she has not been using collagen on as he did not receive that in his packaging he did not call us and let us know that. Apparently that just was missed on the order. Nonetheless we will get that straightened out today. 8/9-Patient returns for bilateral foot wounds, using Prisma with hydrogel moistened dressings, and the wounds appear stable. Patient using surgical shoes, avoiding much pressure or weightbearing as much as possible 8/16; patient has bilateral foot wounds. 1 on the right lateral foot proximally the other is on the left mid lateral foot. Both required debridement of callus and thick skin around the wounds. We have been using silver collagen 8/27; patient has bilateral lateral foot wounds. The area on the left substantially surrounded by callus and dry skin. This was removed from the wound edge. The underlying wound is small. The area on the right measured somewhat smaller today. 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 Unfortunately I did not update his antibiotics during today's visit. 9/10 I reviewed Dr. Hale Bogus last notes he felt he had completed antibiotics his inflammatory markers were reasonably well controlled. He has a small wound on the lateral left foot and a tiny area on the right which is just above closed. He is using Hydrofera Blue with border foam he has bilateral surgical shoes 9/24; 2 week f/u. doing well. right foot is closed. left foot still undermined. 10/14; right foot remains closed at the fifth met head. The area over the base of the left fifth  metatarsal has a small open area but considerable undermining towards the plantar foot. Thick callus skin around this suggests an adequate pressure relief. We have talked about this. He says he is going to go back into his cam boot. I suggested a total contact cast he did not seem enamored with this suggestion 10/26; left foot base of the fifth metatarsal. Same condition as last time. He has skin over the area with an open wound however the skin is not adherent. He went to see Dr. Earleen Newport who did an x-ray and culture of his foot I have not reviewed the x-ray but the patient was not told anything. He is on doxycycline 11/11; since the patient was last here he was in the emergency room on 10/30 he was concerned about swelling in the left foot. They did not do any cultures or x-rays. They changed his antibiotics to cephalexin. Previous culture showed group B strep. The cephalexin is appropriate as doxycycline has less than  predictable coverage. Arrives in clinic today with swelling over this area under the wound. He also has a new wound on the right fifth metatarsal head 11/18; the patient has a difficult wound on the lateral aspect of the left fifth metatarsal head. The wound was almost ballotable last week I opened it slightly expecting to see purulence however there was just bleeding. I cultured this this was negative. X-ray unchanged. We are trying to get an MRI but I am not sure were going to be able to get this through his insurance. He also has an area on the right lateral fifth metatarsal head this looks healthier 12/3; the patient finally got our MRI. Surprisingly this did not show osteomyelitis. I did show the soft tissue ulceration at the lateral plantar aspect of the fifth metatarsal base with a tiny residual 6 mm abscess overlying the superficial fascia I have tried to culture this area I have not been able to get this to grow anything. Nevertheless the protruding tissue looks aggravated. I  suspect we should try to treat the underlying "abscess with broad-spectrum antibiotics. I am going to start him on Levaquin and Flagyl. He has much less edema in his legs and I am going to continue to wrap his legs and see him weekly 12/10. I started Levaquin and Flagyl on him last week. He just picked up the Flagyl apparently there was some delay. The worry is the wound on the left fifth metatarsal base which is substantial and worsening. His foot looks like he inverts at the ankle making this a weightbearing surface. Certainly no improvement in fact I think the measurements of this are somewhat worse. We have been using 12/17; he apparently just got the Levaquin yesterday this is 2 weeks after the fact. He has completed the Flagyl. The area over the left fifth metatarsal base still has protruding granulation tissue although it does not look quite as bad as it did some weeks ago. He has severe bilateral lymphedema although we have not been treating him for wounds on his legs this is definitely going to require compression. There was so much edema in the left I did not wish to put him in a total contact cast today. I am going to increase his compression from 3-4 layer. The area on the right lateral fifth met head actually look quite good and superficial. 12/23; patient arrived with callus on the right fifth met head and the substantial hyper granulated callused wound on the base of his fifth metatarsal. He says he is completing his Levaquin in 2 days but I do not think that adds up with what I gave him but I will have to double check this. We are using Hydrofera Blue on both areas. My plan is to put the left leg in a cast the week after New Year's 04/06/2020; patient's wounds about the same. Right lateral fifth metatarsal head and left lateral foot over the base of the fifth metatarsal. There is undermining on the left lateral foot which I removed before application of total contact cast continuing with  Hydrofera Blue new. Patient tells me he was seen by endocrinology today lab work was done [Dr. Kerr]. Also wondering whether he was referred to cardiology. I went over some lab work from previously does not have chronic renal failure certainly not nephrotic range proteinuria he does have very poorly controlled diabetes but this is not his most updated lab work. Hemoglobin A1c has been over 11 1/10; the patient had a  considerable amount of leakage towards mid part of his left foot with macerated skin however the wound surface looks better the area on the right lateral fifth met head is better as well. I am going to change the dressing on the left foot under the total contact cast to silver alginate, continue with Hydrofera Blue on the right. 1/20; patient was in the total contact cast for 10 days. Considerable amount of drainage although the skin around the wound does not look too bad on the left foot. The area on the right fifth metatarsal head is closed. Our nursing staff reports large amount of drainage out of the left lateral foot wound 1/25; continues with copious amounts of drainage described by our intake staff. PCR culture I did last week showed E. coli and Enterococcus faecalis and low quantities. Multiple resistance genes documented including extended spectrum beta lactamase, MRSA, MRSE, quinolone, tetracycline. The wound is not quite as good this week as it was 5 days ago but about the same size 2/3; continues with copious amounts of malodorous drainage per our intake nurse. The PCR culture I did 2 weeks ago showed E. coli and low quantities of Enterococcus. There were multiple resistance genes detected. I put Neosporin on him last week although this does not seem to have helped. The wound is slightly deeper today. Offloading continues to be an issue here although with the amount of drainage she has a total contact cast is just not going to work 2/10; moderate amount of drainage. Patient  reports he cannot get his stocking on over the dressing. I told him we have to do that the nurse gave him suggestions on how to make this work. The wound is on the bottom and lateral part of his left foot. Is cultured predominantly grew low amounts of Enterococcus, E. coli and anaerobes. There were multiple resistance genes detected including extended spectrum beta lactamase, quinolone, tetracycline. I could not think of an easy oral combination to address this so for now I am going to do topical antibiotics provided by [facility] I think the main agents here are vancomycin and an aminoglycoside. We have to be able to give him access to the wounds to get the topical antibiotic on 2/17; moderate amount of drainage this is unchanged. He has his Keystone topical antibiotic against the deep tissue culture organisms. He has been using this and changing the dressing daily. Silver alginate on the wound surface. 2/24; using Keystone antibiotic with silver alginate on the top. He had too much drainage for a total contact cast at one point although I think that is improving and I think in the next week or 2 it might be possible to replace a total contact cast I did not do this today. In general the wound surface looks healthy however he continues to have thick rims of skin and subcutaneous tissue around the wide area of the circumference which I debrided 06/04/2020 upon evaluation today patient appears to be doing well in regard to his wound. I do feel like he is showing signs of improvement. There is little bit of callus and dead tissue around the edges of the wound as well as what appears to be a little bit of a sinus tract that is off to the side laterally I would perform debridement to clear that away today. 3/17; left lateral foot. The wound looks about the same as I remember. Not much depth surface looks healthy. No evidence of infection 3/25; left lateral foot. Wound surface  looks about the same. Separating  epithelium from the circumference. There really is no evidence of infection here however not making progress by my view 3/29; left lateral foot. Surface of the wound again looks reasonably healthy still thick skin and subcutaneous tissue around the wound margins. There is no evidence of infection. One of the concerns being brought up by the nurses has again the amount of drainage vis--vis continued use of a total contact cast 4/5; left lateral foot at roughly the base of the fifth metatarsal. Nice healthy looking granulated tissue with rims of epithelialization. The overall wound measurements are not any better but the tissue looks healthy. The only concern is the amount of drainage although he has no surrounding maceration with what we have been doing recently to absorb fluid and protect his skin. He also has lymphedema. 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 He states that the kerlix/coban under the TCC rolled down and could not get it back up. He has been using an offloading scooter and has somehow hurt his right foot using this device. This happened last week. He states that the side of his right foot developed a blister and opened. The top of his foot also has a few small open wounds he thinks is due to his socks rubbing in his shoes. He has not been using any dressings to the wound. He denies purulent drainage, fever/chills or erythema to the wounds. 4/22; patient presents for 1 week follow-up. He developed new wounds to the right foot that were evaluated at last clinic visit. He continues to have a total contact cast to the left leg and he reports no issues. He has been using silver collagen to the right foot wounds with no issues. He denies  purulent drainage, fever/chills or erythema to the right foot wounds. He has no complaints today 4/25; patient presents for 1 week follow-up. He has a total contact cast of the left leg and reports no issues. He has been using silver alginate to the right foot wound. He denies purulent drainage, fever/chills or erythema to the right foot wounds. 5/2 patient presents for 1 week follow-up. T contact cast on the left. The wound which is on the base of the plantar foot at the base of the fifth metatarsal otal actually looks quite good and dimensions continue to gradually contract. HOWEVER the area on the right lateral fifth metatarsal head is much larger than what I remember from 2 weeks ago. Once more is he has significant levels of hypergranulation. Noteworthy that he had this same hyper granulated response on his wound on the left foot at one point in time. So much so that he I thought there was an underlying fluid collection. Based on this I think this just needs debridement. 5/9; the wound on the left actually continues to be gradually smaller with a healthy surface. Slight amount of drainage and maceration of the skin around but not too bad. However he has a large wound over the right fifth metatarsal head very much in the same configuration as his left foot wound was initially. I used silver nitrate to address the hyper granulated tissue no mechanical debridement 5/16; area on the left foot did not look as healthy  this week deeper thick surrounding macerated skin and subcutaneous tissue. oo The area on the right foot fifth met head was about the same oo The area on the right ankle that we identified last week is completely broken down into an open wound presumably a stocking rubbing issue 5/23; patient has been using a total contact cast to the left side. He has been using silver alginate underneath. He has also been using silver alginate to the right foot wounds. He has no complaints today.  He denies any signs of infection. 5/31; the left-sided wound looks some better measure smaller surface granulation looks better. We have been using silver alginate under the total contact cast oo The large area on his right fifth met head and right dorsal foot look about the same still using silver alginate 6/6; neither side is good as I was hoping although the surface area dimensions are better. A lot of maceration on his left and right foot around the wound edge. Area on the dorsal right foot looks better. He says he was traveling. I am not sure what does the amount of maceration around the plantar wounds may be drainage issues 6/13; in general the wound surfaces look quite good on both sides. Macerated skin and raised edges around the wound required debridement although in general especially on the left the surface area seems improved. oo The area on the right dorsal ankle is about the same I thought this would not be such a problem to close Objective Constitutional Sitting or standing Blood Pressure is within target range for patient.. Pulse regular and within target range for patient.Marland Kitchen Respirations regular, non-labored and within target range.. Temperature is normal and within the target range for the patient.Marland Kitchen Appears in no distress. Vitals Time Taken: 2:00 PM, Height: 77 in, Weight: 280 lbs, BMI: 33.2, Temperature: 99.2 F, Pulse: 101 bpm, Respiratory Rate: 18 breaths/min, Blood Pressure: 115/83 mmHg, Capillary Blood Glucose: 130 mg/dl. Cardiovascular Pedal pulses palpable and strong bilaterally.. Significant lymphedema in both lower legs. General Notes: Wound exam; left lateral foot a lot of maceration. I removed macerated skin from around the circumference of both wounds. Fortunately the surfaces continue to look really quite good and I believe the condition of both wounds is improved over time. oo Frustratingly the area on the right dorsal ankle is not nearly as good as I had hoped  oo No evidence of infection in any wound area Integumentary (Hair, Skin) Wound #3 status is Open. Original cause of wound was Trauma. The date acquired was: 10/02/2019. The wound has been in treatment 46 weeks. The wound is located on the Left,Lateral Foot. The wound measures 1.8cm length x 2.1cm width x 0.3cm depth; 2.969cm^2 area and 0.891cm^3 volume. There is Fat Layer (Subcutaneous Tissue) exposed. There is no tunneling noted, however, there is undermining starting at 8:00 and ending at 12:00 with a maximum distance of 0.8cm. There is a large amount of serosanguineous drainage noted. The wound margin is thickened. There is large (67-100%) red granulation within the wound bed. There is no necrotic tissue within the wound bed. General Notes: calloused periwound Wound #5 status is Open. Original cause of wound was Gradually Appeared. The date acquired was: 07/07/2020. The wound has been in treatment 8 weeks. The wound is located on the Right,Lateral Metatarsal head fifth. The wound measures 2.3cm length x 3.2cm width x 0.4cm depth; 5.781cm^2 area and 2.312cm^3 volume. There is no tunneling noted, however, there is undermining starting at 1:00 and ending at 3:00  with a maximum distance of 0.5cm. There is a medium amount of serosanguineous drainage noted. The wound margin is distinct with the outline attached to the wound base. There is large (67-100%) red granulation within the wound bed. There is no necrotic tissue within the wound bed. General Notes: calloused periwound Wound #7 status is Open. Original cause of wound was Gradually Appeared. The date acquired was: 08/17/2020. The wound has been in treatment 4 weeks. The wound is located on the Right,Anterior Ankle. The wound measures 1.6cm length x 1.2cm width x 0.1cm depth; 1.508cm^2 area and 0.151cm^3 volume. There is Fat Layer (Subcutaneous Tissue) exposed. There is a medium amount of serosanguineous drainage noted. The wound margin is distinct  with the outline attached to the wound base. There is large (67-100%) red granulation within the wound bed. There is a small (1-33%) amount of necrotic tissue within the wound bed including Adherent Slough. 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 There was a Selective/Open Wound Skin/Epidermis Debridement with a total area of 1.8 sq cm performed by [name], MD. With the following instrument(s): Curette to remove Non-Viable tissue/material. Material removed includes Callus and Skin: Epidermis and after achieving pain control using Other (benzocaine 20% spray). No specimens were taken. A time out was conducted at 16:35, prior to the start of the procedure. A Minimum amount of bleeding was controlled with Pressure. The procedure was tolerated well with a pain level of 0 throughout and a pain level of 0 following the procedure. Post Debridement Measurements: 1.8cm length x 2.1cm width x 0.3cm depth; 0.891cm^3 volume. Character of Wound/Ulcer Post Debridement is improved. Severity of Tissue Post Debridement is: Fat layer exposed. Post procedure Diagnosis Wound #3: Same as Pre-Procedure Pre-procedure diagnosis of Wound #3 is a Diabetic Wound/Ulcer of the Lower Extremity located on the Left,Lateral Foot . There was a T Contact Cast otal Procedure by [name], MD. Post procedure Diagnosis Wound #3: Same as Pre-Procedure Wound #5 Pre-procedure diagnosis of Wound #5 is a Diabetic Wound/Ulcer of the Lower Extremity  located on the Right,Lateral Metatarsal head fifth .Severity of Tissue Pre Debridement is: Fat layer exposed. There was a Selective/Open Wound Skin/Epidermis Debridement with a total area of 2.3 sq cm performed by [name], MD. With the following instrument(s): Curette to remove Non-Viable tissue/material. Material removed includes Callus and Skin: Epidermis and after achieving pain control using Other (benzocaine 20% spray). No specimens were taken. A time out was conducted at 16:35, prior to the start of the procedure. A Minimum amount of bleeding was controlled with Pressure. The procedure was tolerated well with a pain level of 0 throughout and a pain level of 0 following the procedure. Post Debridement Measurements: 2.3cm length x 3.2cm width x 0.4cm depth; 2.312cm^3 volume. Character of Wound/Ulcer Post Debridement is improved. Severity of Tissue Post Debridement is: Fat layer exposed. Post procedure Diagnosis Wound #5: Same as Pre-Procedure Plan Follow-up Appointments: Return Appointment in 1 week. - ***60 minutes for cast*** - with Dr. Arcola Jansky Shower/ Hygiene: May shower with protection but do not get wound dressing(s) wet. -  use a cast protector to left leg. Edema Control - Lymphedema / SCD / Other: Elevate legs to the level of the heart or above for 30 minutes daily and/or when sitting, a frequency of: - throughout the day Avoid standing for long periods of time. Exercise regularly Moisturize legs daily. - right leg every night before bed. Compression stocking or Garment 20-30 mm/Hg pressure to: - Apply to right leg in the morning and remove at night. Off-Loading: T Contact Cast to Left Lower Extremity otal Open toe surgical shoe to: - right foot WOUND #3: - Foot Wound Laterality: Left, Lateral Cleanser: Soap and Water 1 x Per Week/30 Days Discharge Instructions: May shower and wash wound with dial antibacterial soap and water prior to dressing change. Peri-Wound  Care: Zinc Oxide Ointment 30g tube 1 x Per Week/30 Days Discharge Instructions: Apply Zinc Oxide to periwound with each dressing change as needed. Prim Dressing: KerraCel Ag Gelling Fiber Dressing, 2x2 in (silver alginate) 1 x Per Week/30 Days ary Discharge Instructions: Apply silver alginate to wound bed as instructed Secondary Dressing: Woven Gauze Sponge, Non-Sterile 4x4 in 1 x Per Week/30 Days Discharge Instructions: Apply over primary dressing as directed. Secondary Dressing: ABD Pad, 8x10 1 x Per Week/30 Days Discharge Instructions: T down before applying kerlix. Apply over primary dressing as directed. ape Secondary Dressing: Zetuvit Plus 4x8 in 1 x Per Week/30 Days Discharge Instructions: Apply over primary dressing as directed. Secured With: [employer], 4.5x3.1 (in/yd) 1 x Per Week/30 Days Discharge Instructions: Secure with Kerlix as directed. Com pression Wrap: Kerlix Roll 4.5x3.1 (in/yd) 1 x Per Week/30 Days Discharge Instructions: Apply Kerlix and Coban compression under TCC. Com pression Wrap: Coban Self-Adherent Wrap 4x5 (in/yd) 1 x Per Week/30 Days Discharge Instructions: Apply over Kerlix as directed. WOUND #5: - Metatarsal head fifth Wound Laterality: Right, Lateral Cleanser: Normal Saline (Generic) Every Other Day/15 Days Discharge Instructions: Cleanse the wound with Normal Saline prior to applying a clean dressing using gauze sponges, not tissue or cotton balls. Cleanser: Soap and Water Every Other Day/15 Days Discharge Instructions: May shower and wash wound with dial antibacterial soap and water prior to dressing change. Prim Dressing: KerraCel Ag Gelling Fiber Dressing, 2x2 in (silver alginate) (Generic) Every Other Day/15 Days ary Discharge Instructions: Apply silver alginate to wound bed as instructed Secondary Dressing: ABD Pad, 8x10 (Generic) Every Other Day/15 Days Discharge Instructions: Apply over primary dressing as directed. Secondary Dressing:  Optifoam Non-Adhesive Dressing, 4x4 in (Generic) Every Other Day/15 Days Discharge Instructions: Apply over primary dressing, cut to make foam donut to offload Secured With: Kerlix Roll Sterile, 4.5x3.1 (in/yd) (Generic) Every Other Day/15 Days Discharge Instructions: Secure with Kerlix as directed. Secured With: 78M Medipore H Soft Cloth Surgical T ape, 2x2 (in/yd) (Generic) Every Other Day/15 Days Discharge Instructions: Secure dressing with tape as directed. WOUND #7: - Ankle Wound Laterality: Right, Anterior Cleanser: Normal Saline (Generic) Every Other Day/15 Days Discharge Instructions: Cleanse the wound with Normal Saline prior to applying a clean dressing using gauze sponges, not tissue or cotton balls. Cleanser: Soap and Water Every Other Day/15 Days Discharge Instructions: May shower and wash wound with dial antibacterial soap and water prior to dressing change. Prim Dressing: KerraCel Ag Gelling Fiber Dressing, 2x2 in (silver alginate) (Generic) Every Other Day/15 Days ary Discharge Instructions: Apply silver alginate to wound bed as instructed Secondary Dressing: Woven Gauze Sponge, Non-Sterile 4x4 in (Generic) Every Other Day/15 Days Discharge Instructions: Apply over primary dressing as directed. Secondary Dressing: ABD  Pad, 8x10 (Generic) Every Other Day/15 Days Discharge Instructions: Apply over primary dressing as directed. Secured With: [employer], 4.5x3.1 (in/yd) (Generic) Every Other Day/15 Days Discharge Instructions: Secure with Kerlix as directed. Secured With: 65M Medipore H Soft Cloth Surgical T ape, 2x2 (in/yd) (Generic) Every Other Day/15 Days Discharge Instructions: Secure dressing with tape as directed. 1. I continued with silver alginate as the primary dressing 2. Continue with total contact cast on the left however we also put kerlix Coban under the cast to help control some of the swelling 3. May consider wrapping the right leg as well 4. I feel like we  are making some progress albeit slowly in the plantar foot wounds. Electronic Signature(s) Signed: 09/14/2020 5:24:16 PM By: [name] MD Entered By: [name] on 09/14/2020 17:16:32 -------------------------------------------------------------------------------- Total Contact Cast Details Patient Name: Date of Service: [name] 09/14/2020 3:45 PM Medical Record Number: [id number] Patient Account Number: [id number] Date of Birth/Sex: Treating RN: 20-Mar-1986 (35 y.o. [name] Primary Care Provider: [name] Other Clinician: Referring Provider: Treating Provider/Extender: [name] in Treatment: 46 T Contact Cast Applied for Wound Assessment: otal Wound #3 Left,Lateral Foot Performed By: Physician [name], MD Post Procedure Diagnosis Same as Pre-procedure Electronic Signature(s) Signed: 09/14/2020 5:24:16 PM By: [name] MD Entered By: [name] on 09/14/2020 17:13:10 -------------------------------------------------------------------------------- SuperBill Details Patient Name: Date of Service: [name], [name] 09/14/2020 Medical Record Number: [id number] Patient Account Number: [id number] Date of Birth/Sex: Treating RN: [birth year] (35 y.o. 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 Facility Procedures CPT4 Code: [id number] Description: [id number] - DEBRIDE WOUND 1ST 20 SQ CM OR < ICD-10 Diagnosis Description  L97.528 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i L97.518 Non-pressure chronic ulcer of other part of right foot with other  specified sever Modifier: ty ity Quantity: 1 Physician Procedures : CPT4 Code Description Modifier [id number] - WC PHYS DEBR WO ANESTH 20 SQ CM ICD-10 Diagnosis Description L97.528 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ity L97.518 Non-pressure chronic ulcer of other part of  right foot with other specified severity Quantity: 1 Electronic Signature(s) Signed: 09/14/2020 5:24:16 PM By: [name] MD Entered By: [name] on 09/14/2020 17:16:46

## 2020-09-16 NOTE — Progress Notes (Signed)
[place], [name] ([id number]) Visit Report for 09/14/2020 Arrival Information Details Patient Name: Date of Service: [name] 09/14/2020 3:45 PM Medical Record Number: [id number] Patient Account Number: [id number] Date of Birth/Sex: Treating RN: [birth year] (35 y.o. [name] Primary Care [name]: [name] Other Clinician: Referring [name]: Treating [name]/Extender: [name] in Treatment: 46 Visit Information History Since Last Visit Added or deleted any medications: No Patient Arrived: Ambulatory Any new allergies or adverse reactions: No Arrival Time: 15:57 Had a fall or experienced change in No Transfer Assistance: None activities of daily living that may affect Patient Identification Verified: Yes risk of falls: Secondary Verification Process Completed: Yes Signs or symptoms of abuse/neglect since No Patient Requires Transmission-Based Precautions: No last visito Patient Has Alerts: No Hospitalized since last visit: No Implantable device outside of the clinic No excluding cellular tissue based products placed in the center since last visit: Has Dressing in Place as Prescribed: Yes Has Footwear/Offloading in Place as Yes Prescribed: Left: Surgical Shoe with Pressure Relief Insole Right: T Contact Cast otal Pain Present Now: No Electronic Signature(s) Signed: 09/14/2020 5:21:11 PM By: [name] Entered By: [name] on 09/14/2020 16:01:37 -------------------------------------------------------------------------------- Encounter Discharge Information Details Patient Name: Date of Service: [name], [name] 09/14/2020 3:45 PM Medical Record Number: [id number] Patient Account Number: [id number] Date of Birth/Sex: Treating RN: 02/04/1986 (35 y.o. [name], [name] Primary Care [name]: [name] Other Clinician: Referring [name]: Treating [name]/Extender: [name] in  Treatment: 46 Encounter Discharge Information Items Post Procedure Vitals Discharge Condition: Stable Temperature (F): 98.4 Ambulatory Status: Ambulatory Pulse (bpm): 74 Discharge Destination: Home Respiratory Rate (breaths/min): 17 Transportation: Private Auto Blood Pressure (mmHg): 147/74 Accompanied By: self Schedule Follow-up Appointment: Yes Clinical Summary of Care: Patient Declined Electronic Signature(s) Signed: 09/16/2020 6:22:58 PM By: Max Hammock RN Entered By: [name] on 09/14/2020 16:59:46 -------------------------------------------------------------------------------- Lower Extremity Assessment Details Patient Name: Date of Service: [name], [name] 09/14/2020 3:45 PM Medical Record Number: [id number] Patient Account Number: [id number] Date of Birth/Sex: Treating RN: 05-23-1985 (35 y.o. [name] Primary Care Tobenna [name]: [name] Other Clinician: Referring [name]: Treating [name]/Extender: [name] in Treatment: 46 Edema Assessment Assessed: [name]: Yes] [name]: Yes] Edema: [Left: Yes] [Right: Yes] Calf Left: Right: Point of Measurement: 48 cm From Medial Instep 52 cm 48.2 cm Ankle Left: Right: Point of Measurement: 10 cm From Medial Instep 33.8 cm 32 cm Vascular Assessment Pulses: Dorsalis Pedis Palpable: [Left:Yes] [Right:Yes] Electronic Signature(s) Signed: 09/14/2020 5:21:11 PM By: [name] Entered By: [name] on 09/14/2020 16:20:24 -------------------------------------------------------------------------------- Multi Wound Chart Details Patient Name: Date of Service: [name], [name] 09/14/2020 3:45 PM Medical Record Number: [id number] Patient Account Number: [id number] Date of Birth/Sex: Treating RN: 05-07-1985 (35 y.o. [name] Primary Care Melaina Howerton: [name] Other Clinician: Referring [name]: Treating Makalyn [name]/Extender: [name] in Treatment: 46 Vital Signs Height(in): 77 Capillary Blood Glucose(mg/dl): 130 Weight(lbs): 280 Pulse(bpm): 101 Body Mass Index(BMI): 33 Blood Pressure(mmHg): 115/83 Temperature(F): 99.2 Respiratory Rate(breaths/min): 18 Photos: [3:No Photos Left, Lateral Foot] [5:No Photos Right, Lateral Metatarsal head fifth] [7:No Photos Right, Anterior Ankle] Wound Location: [3:Trauma] [5:Gradually Appeared] [7:Gradually Appeared] Wounding Event: [3:Diabetic Wound/Ulcer of the Lower] [5:Diabetic Wound/Ulcer of the Lower] [7:Diabetic Wound/Ulcer of the Lower] Primary Etiology: [3:Extremity Type II Diabetes] [5:Extremity Type II Diabetes] [7:Extremity Type II Diabetes] Comorbid History: [3:10/02/2019] [5:07/07/2020] [7:08/17/2020] Date Acquired: [3:46] [5:8] [7:4] Weeks of Treatment: [3:Open] [5:Open] [7:Open] Wound  Status: [3:1.8x2.1x0.3] [5:2.3x3.2x0.4] [7:1.6x1.2x0.1] Measurements L x W x D (cm) [3:2.969] [5:5.781] [7:1.508] A (cm) : rea [3:0.891] [5:2.312] [7:0.151] Volume (cm) : [3:-80.00%] [5:34.30%] [7:33.60%] % Reduction in A rea: [3:-440.00%] [5:47.40%] [7:33.50%] % Reduction in Volume: [3:8] [5:1] Starting Position 1 (o'clock): [3:12] [5:3] Ending Position 1 (o'clock): [3:0.8] [5:0.5] Maximum Distance 1 (cm): [3:Yes] [5:Yes] [7:N/A] Undermining: [3:Grade 2] [5:Grade 2] [7:Grade 2] Classification: [3:Large] [5:Medium] [7:Medium] Exudate A mount: [3:Serosanguineous] [5:Serosanguineous] [7:Serosanguineous] Exudate Type: [3:red, brown] [5:red, brown] [7:red, brown] Exudate Color: [3:Thickened] [5:Distinct, outline attached] [7:Distinct, outline attached] Wound Margin: [3:Large (67-100%)] [5:Large (67-100%)] [7:Large (67-100%)] Granulation A mount: [3:Red] [5:Red] [7:Red] Granulation Quality: [3:None Present (0%)] [5:None Present (0%)] [7:Small (1-33%)] Necrotic A mount: [3:Fat Layer (Subcutaneous Tissue): Yes N/A] [7:Fat Layer (Subcutaneous Tissue): Yes] Exposed  Structures: [3:Fascia: No Tendon: No Muscle: No Joint: No Bone: No Small (1-33%)] [5:Small (1-33%)] [7:Fascia: No Tendon: No Muscle: No Joint: No Bone: No N/A] Epithelialization: [3:Debridement - Selective/Open Wound Debridement - Selective/Open Wound] [7:N/A] Debridement: Pre-procedure Verification/Time Out 16:35 [5:16:35] [7:N/A] Taken: [3:Other] [5:Other] [7:N/A] Pain Control: [3:Callus] [5:Callus] [7:N/A] Tissue Debrided: [3:Skin/Epidermis] [5:Skin/Epidermis] [7:N/A] Level: [3:1.8] [5:2.3] [7:N/A] Debridement A (sq cm): [3:rea Curette] [5:Curette] [7:N/A] Instrument: [3:Minimum] [5:Minimum] [7:N/A] Bleeding: [3:Pressure] [5:Pressure] [7:N/A] Hemostasis A chieved: [3:0] [5:0] [7:N/A] Procedural Pain: [3:0] [5:0] [7:N/A] Post Procedural Pain: [3:Procedure was tolerated well] [5:Procedure was tolerated well] [7:N/A] Debridement Treatment Response: [3:1.8x2.1x0.3] [5:2.3x3.2x0.4] [7:N/A] Post Debridement Measurements L x W x D (cm) [3:0.891] [5:2.312] [7:N/A] Post Debridement Volume: (cm) [3:calloused periwound] [5:calloused periwound] [7:N/A] Assessment Notes: [3:Debridement] [5:Debridement] [7:N/A] Procedures Performed: [3:T Contact Cast otal] Treatment Notes Wound #3 (Foot) Wound Laterality: Left, Lateral Cleanser Soap and Water Discharge Instruction: May shower and wash wound with dial antibacterial soap and water prior to dressing change. Peri-Wound Care Zinc Oxide Ointment 30g tube Discharge Instruction: Apply Zinc Oxide to periwound with each dressing change as needed. Topical Primary Dressing KerraCel Ag Gelling Fiber Dressing, 2x2 in (silver alginate) Discharge Instruction: Apply silver alginate to wound bed as instructed Secondary Dressing Woven Gauze Sponge, Non-Sterile 4x4 in Discharge Instruction: Apply over primary dressing as directed. Zetuvit Plus 4x8 in Discharge Instruction: Apply over primary dressing as directed. ABD Pad, 8x10 Discharge Instruction: T down  before applying kerlix. Apply over primary dressing as directed. ape Secured With [employer], 4.5x3.1 (in/yd) Discharge Instruction: Secure with Kerlix as directed. Compression Wrap Kerlix Roll 4.5x3.1 (in/yd) Discharge Instruction: Apply Kerlix and Coban compression under TCC. Coban Self-Adherent Wrap 4x5 (in/yd) Discharge Instruction: Apply over Kerlix as directed. Compression Stockings Add-Ons Wound #5 (Metatarsal head fifth) Wound Laterality: Right, Lateral Cleanser Normal Saline Discharge Instruction: Cleanse the wound with Normal Saline prior to applying a clean dressing using gauze sponges, not tissue or cotton balls. Soap and Water Discharge Instruction: May shower and wash wound with dial antibacterial soap and water prior to dressing change. Peri-Wound Care Topical Primary Dressing KerraCel Ag Gelling Fiber Dressing, 2x2 in (silver alginate) Discharge Instruction: Apply silver alginate to wound bed as instructed Secondary Dressing ABD Pad, 8x10 Discharge Instruction: Apply over primary dressing as directed. Optifoam Non-Adhesive Dressing, 4x4 in Discharge Instruction: Apply over primary dressing, cut to make foam donut to offload Secured With [employer] Sterile, 4.5x3.1 (in/yd) Discharge Instruction: Secure with Kerlix as directed. 16M Medipore H Soft Cloth Surgical T ape, 2x2 (in/yd) Discharge Instruction: Secure dressing with tape as directed. Compression Wrap Compression Stockings Add-Ons Wound #7 (Ankle) Wound Laterality: Right, Anterior Cleanser Normal Saline Discharge Instruction: Cleanse the wound with Normal Saline prior to  applying a clean dressing using gauze sponges, not tissue or cotton balls. Soap and Water Discharge Instruction: May shower and wash wound with dial antibacterial soap and water prior to dressing change. Peri-Wound Care Topical Primary Dressing KerraCel Ag Gelling Fiber Dressing, 2x2 in (silver alginate) Discharge  Instruction: Apply silver alginate to wound bed as instructed Secondary Dressing Woven Gauze Sponge, Non-Sterile 4x4 in Discharge Instruction: Apply over primary dressing as directed. ABD Pad, 8x10 Discharge Instruction: Apply over primary dressing as directed. Secured With [employer], 4.5x3.1 (in/yd) Discharge Instruction: Secure with Kerlix as directed. 43M Medipore H Soft Cloth Surgical T ape, 2x2 (in/yd) Discharge Instruction: Secure dressing with tape as directed. Compression Wrap Compression Stockings Add-Ons Electronic Signature(s) Signed: 09/14/2020 5:24:16 PM By: [name] MD Signed: 09/15/2020 6:10:01 PM By: Baruch Gouty RN, BSN Entered By: [name] on 09/14/2020 17:12:34 -------------------------------------------------------------------------------- Multi-Disciplinary Care Plan Details Patient Name: Date of Service: [name], [name] 09/14/2020 3:45 PM Medical Record Number: [id number] Patient Account Number: [id number] Date of Birth/Sex: Treating RN: 03/28/1986 (35 y.o. [name] Primary Care Joshua Zeringue: [name] Other Clinician: Referring Chancy Smigiel: Treating Kilie Rund/Extender: [name] in Treatment: 46 Multidisciplinary Care Plan reviewed with physician Active Inactive Nutrition Nursing Diagnoses: Imbalanced nutrition Potential for alteratiion in Nutrition/Potential for imbalanced nutrition Goals: Patient/caregiver agrees to and verbalizes understanding of need to use nutritional supplements and/or vitamins as prescribed Date Initiated: 10/24/2019 Date Inactivated: 04/06/2020 Target Resolution Date: 04/03/2020 Goal Status: Met Patient/caregiver will maintain therapeutic glucose control Date Initiated: 10/24/2019 Target Resolution Date: 09/18/2020 Goal Status: Active Interventions: Assess HgA1c results as ordered upon admission and as needed Assess patient nutrition upon admission and as needed per  policy Provide education on elevated blood sugars and impact on wound healing Provide education on nutrition Treatment Activities: Education provided on Nutrition : 07/21/2020 Notes: Wound/Skin Impairment Nursing Diagnoses: Impaired tissue integrity Knowledge deficit related to ulceration/compromised skin integrity Goals: Patient/caregiver will verbalize understanding of skin care regimen Date Initiated: 10/24/2019 Target Resolution Date: 09/18/2020 Goal Status: Active Ulcer/skin breakdown will have a volume reduction of 30% by week 4 Date Initiated: 10/24/2019 Date Inactivated: 01/16/2020 Target Resolution Date: 01/10/2020 Unmet Reason: no change in Goal Status: Unmet measurements. Interventions: Assess patient/caregiver ability to obtain necessary supplies Assess patient/caregiver ability to perform ulcer/skin care regimen upon admission and as needed Assess ulceration(s) every visit Provide education on ulcer and skin care Notes: Electronic Signature(s) Signed: 09/15/2020 6:10:01 PM By: Baruch Gouty RN, BSN Entered By: Baruch Gouty on 09/14/2020 16:17:53 -------------------------------------------------------------------------------- Pain Assessment Details Patient Name: Date of Service: [name], [name] 09/14/2020 3:45 PM Medical Record Number: [id number] Patient Account Number: [id number] Date of Birth/Sex: Treating RN: [birth year] (35 y.o. [name] Primary Care Winnie Umali: [name] Other Clinician: Referring Mertie Haslem: Treating Jeferson [name]/Extender: [name] in Treatment: 46 Active Problems Location of Pain Severity and Description of Pain Patient Has Paino No Site Locations Pain Management and Medication Current Pain Management: Electronic Signature(s) Signed: 09/14/2020 5:21:11 PM By: [name] Entered By: [name] on 09/14/2020  16:07:40 -------------------------------------------------------------------------------- Patient/Caregiver Education Details Patient Name: Date of Service: [name] 6/13/2022andnbsp3:45 PM Medical Record Number: [id number] Patient Account Number: [id number] Date of Birth/Gender: Treating RN: 1985-10-15 (35 y.o. [name] Primary Care Physician: [name] Other Clinician: Referring Physician: Treating Physician/Extender: [name] in Treatment: 17 Education Assessment Education Provided To: Patient Education Topics Provided Offloading: Methods: Explain/Verbal Responses: Reinforcements needed, State content correctly Wound/Skin Impairment:  Methods: Explain/Verbal Responses: Reinforcements needed, State content correctly Electronic Signature(s) Signed: 09/15/2020 6:10:01 PM By: Baruch Gouty RN, BSN Entered By: Baruch Gouty on 09/14/2020 16:18:26 -------------------------------------------------------------------------------- Wound Assessment Details Patient Name: Date of Service: [name], [name] 09/14/2020 3:45 PM Medical Record Number: [id number] Patient Account Number: [id number] Date of Birth/Sex: Treating RN: 11-Jul-1985 (35 y.o. [name] Primary Care Jamarius Saha: [name] Other Clinician: Referring Crestina Strike: Treating Pina Sirianni/Extender: [name] in Treatment: 46 Wound Status Wound Number: 3 Primary Etiology: Diabetic Wound/Ulcer of the Lower Extremity Wound Location: Left, Lateral Foot Wound Status: Open Wounding Event: Trauma Comorbid History: Type II Diabetes Date Acquired: 10/02/2019 Weeks Of Treatment: 46 Clustered Wound: No Photos Wound Measurements Length: (cm) 1.8 Width: (cm) 2.1 Depth: (cm) 0.3 Area: (cm) 2.969 Volume: (cm) 0.891 % Reduction in Area: -80% % Reduction in Volume: -440% Epithelialization: Small (1-33%) Tunneling: No Undermining:  Yes Starting Position (o'clock): 8 Ending Position (o'clock): 12 Maximum Distance: (cm) 0.8 Wound Description Classification: Grade 2 Wound Margin: Thickened Exudate Amount: Large Exudate Type: Serosanguineous Exudate Color: red, brown Wound Bed Granulation Amount: Large (67-100%) Granulation Quality: Red Necrotic Amount: None Present (0%) Foul Odor After Cleansing: No Slough/Fibrino No Exposed Structure Fascia Exposed: No Fat Layer (Subcutaneous Tissue) Exposed: Yes Tendon Exposed: No Muscle Exposed: No Joint Exposed: No Bone Exposed: No Assessment Notes calloused periwound Treatment Notes Wound #3 (Foot) Wound Laterality: Left, Lateral Cleanser Soap and Water Discharge Instruction: May shower and wash wound with dial antibacterial soap and water prior to dressing change. Peri-Wound Care Zinc Oxide Ointment 30g tube Discharge Instruction: Apply Zinc Oxide to periwound with each dressing change as needed. Topical Primary Dressing KerraCel Ag Gelling Fiber Dressing, 2x2 in (silver alginate) Discharge Instruction: Apply silver alginate to wound bed as instructed Secondary Dressing Woven Gauze Sponge, Non-Sterile 4x4 in Discharge Instruction: Apply over primary dressing as directed. Zetuvit Plus 4x8 in Discharge Instruction: Apply over primary dressing as directed. ABD Pad, 8x10 Discharge Instruction: T down before applying kerlix. Apply over primary dressing as directed. ape Secured With [employer], 4.5x3.1 (in/yd) Discharge Instruction: Secure with Kerlix as directed. Compression Wrap Kerlix Roll 4.5x3.1 (in/yd) Discharge Instruction: Apply Kerlix and Coban compression under TCC. Coban Self-Adherent Wrap 4x5 (in/yd) Discharge Instruction: Apply over Kerlix as directed. Compression Stockings Add-Ons Electronic Signature(s) Signed: 09/15/2020 1:59:21 PM By: [name] Signed: 09/16/2020 1:43:18 PM By: Sandre Kitty Previous Signature: 09/14/2020  5:21:11 PM Version By: [name] Entered By: Sandre Kitty on 09/15/2020 10:41:20 -------------------------------------------------------------------------------- Wound Assessment Details Patient Name: Date of Service: [name], [name] 09/14/2020 3:45 PM Medical Record Number: [id number] Patient Account Number: [id number] Date of Birth/Sex: Treating RN: Mar 10, 1986 (35 y.o. [name] Primary Care Elvenia Godden: [name] Other Clinician: Referring Kanen Mottola: Treating Phill Steck/Extender: [name] in Treatment: 46 Wound Status Wound Number: 5 Primary Etiology: Diabetic Wound/Ulcer of the Lower Extremity Wound Location: Right, Lateral Metatarsal head fifth Wound Status: Open Wounding Event: Gradually Appeared Comorbid History: Type II Diabetes Date Acquired: 07/07/2020 Weeks Of Treatment: 8 Clustered Wound: No Photos Wound Measurements Length: (cm) 2.3 Width: (cm) 3.2 Depth: (cm) 0.4 Area: (cm) 5.781 Volume: (cm) 2.312 % Reduction in Area: 34.3% % Reduction in Volume: 47.4% Epithelialization: Small (1-33%) Tunneling: No Undermining: Yes Starting Position (o'clock): 1 Ending Position (o'clock): 3 Maximum Distance: (cm) 0.5 Wound Description Classification: Grade 2 Wound Margin: Distinct, outline attached Exudate Amount: Medium Exudate Type: Serosanguineous Exudate Color: red, brown Foul Odor After Cleansing: No Slough/Fibrino No Wound  Bed Granulation Amount: Large (67-100%) Granulation Quality: Red Necrotic Amount: None Present (0%) Assessment Notes calloused periwound Treatment Notes Wound #5 (Metatarsal head fifth) Wound Laterality: Right, Lateral Cleanser Normal Saline Discharge Instruction: Cleanse the wound with Normal Saline prior to applying a clean dressing using gauze sponges, not tissue or cotton balls. Soap and Water Discharge Instruction: May shower and wash wound with dial antibacterial soap and water prior  to dressing change. Peri-Wound Care Topical Primary Dressing KerraCel Ag Gelling Fiber Dressing, 2x2 in (silver alginate) Discharge Instruction: Apply silver alginate to wound bed as instructed Secondary Dressing ABD Pad, 8x10 Discharge Instruction: Apply over primary dressing as directed. Optifoam Non-Adhesive Dressing, 4x4 in Discharge Instruction: Apply over primary dressing, cut to make foam donut to offload Secured With [employer] Sterile, 4.5x3.1 (in/yd) Discharge Instruction: Secure with Kerlix as directed. 24M Medipore H Soft Cloth Surgical T ape, 2x2 (in/yd) Discharge Instruction: Secure dressing with tape as directed. Compression Wrap Compression Stockings Add-Ons Electronic Signature(s) Signed: 09/15/2020 1:59:21 PM By: [name] Signed: 09/16/2020 1:43:18 PM By: Sandre Kitty Previous Signature: 09/14/2020 5:21:11 PM Version By: [name] Entered By: Sandre Kitty on 09/15/2020 10:41:55 -------------------------------------------------------------------------------- Wound Assessment Details Patient Name: Date of Service: [name], [name] 09/14/2020 3:45 PM Medical Record Number: [id number] Patient Account Number: [id number] Date of Birth/Sex: Treating RN: 12/24/1985 (35 y.o. [name] Primary Care Mariano Doshi: [name] Other Clinician: Referring Sanaia Jasso: Treating Cheryn Lundquist/Extender: [name] in Treatment: 46 Wound Status Wound Number: 7 Primary Etiology: Diabetic Wound/Ulcer of the Lower Extremity Wound Location: Right, Anterior Ankle Wound Status: Open Wounding Event: Gradually Appeared Comorbid History: Type II Diabetes Date Acquired: 08/17/2020 Weeks Of Treatment: 4 Clustered Wound: No Photos Wound Measurements Length: (cm) 1.6 Width: (cm) 1.2 Depth: (cm) 0.1 Area: (cm) 1.508 Volume: (cm) 0.151 % Reduction in Area: 33.6% % Reduction in Volume: 33.5% Wound Description Classification: Grade  2 Wound Margin: Distinct, outline attached Exudate Amount: Medium Exudate Type: Serosanguineous Exudate Color: red, brown Foul Odor After Cleansing: No Slough/Fibrino Yes Wound Bed Granulation Amount: Large (67-100%) Exposed Structure Granulation Quality: Red Fascia Exposed: No Necrotic Amount: Small (1-33%) Fat Layer (Subcutaneous Tissue) Exposed: Yes Necrotic Quality: Adherent Slough Tendon Exposed: No Muscle Exposed: No Joint Exposed: No Bone Exposed: No Treatment Notes Wound #7 (Ankle) Wound Laterality: Right, Anterior Cleanser Normal Saline Discharge Instruction: Cleanse the wound with Normal Saline prior to applying a clean dressing using gauze sponges, not tissue or cotton balls. Soap and Water Discharge Instruction: May shower and wash wound with dial antibacterial soap and water prior to dressing change. Peri-Wound Care Topical Primary Dressing KerraCel Ag Gelling Fiber Dressing, 2x2 in (silver alginate) Discharge Instruction: Apply silver alginate to wound bed as instructed Secondary Dressing Woven Gauze Sponge, Non-Sterile 4x4 in Discharge Instruction: Apply over primary dressing as directed. ABD Pad, 8x10 Discharge Instruction: Apply over primary dressing as directed. Secured With [employer], 4.5x3.1 (in/yd) Discharge Instruction: Secure with Kerlix as directed. 24M Medipore H Soft Cloth Surgical T ape, 2x2 (in/yd) Discharge Instruction: Secure dressing with tape as directed. Compression Wrap Compression Stockings Add-Ons Electronic Signature(s) Signed: 09/15/2020 1:59:21 PM By: [name] Signed: 09/16/2020 1:43:18 PM By: Sandre Kitty Previous Signature: 09/14/2020 5:21:11 PM Version By: [name] Entered By: Sandre Kitty on 09/15/2020 10:42:45 -------------------------------------------------------------------------------- Vitals Details Patient Name: Date of Service: [name], [name] 09/14/2020 3:45 PM Medical Record Number:  [id number] Patient Account Number: [id number] Date of Birth/Sex: Treating RN: 1985-12-02 (35 y.o. [name] Primary  Care Markevious Ehmke: [name] Other Clinician: Referring Alexcis Bicking: Treating Neno Hohensee/Extender: [name] in Treatment: 46 Vital Signs Time Taken: 14:00 Temperature (F): 99.2 Height (in): 77 Pulse (bpm): 101 Weight (lbs): 280 Respiratory Rate (breaths/min): 18 Body Mass Index (BMI): 33.2 Blood Pressure (mmHg): 115/83 Capillary Blood Glucose (mg/dl): 130 Reference Range: 80 - 120 mg / dl Electronic Signature(s) Signed: 09/14/2020 5:21:11 PM By: [name] Entered By: [name] on 09/14/2020 16:07:33

## 2020-09-17 NOTE — Progress Notes (Addendum)
[name], [name] ([id number]) Visit Report for 09/11/2020 Physician Orders Details Patient Name: Date of Service: [name] 09/11/2020 2:45 PM Medical Record Number: [id number] Patient Account Number: [id number] Date of Birth/Sex: Treating RN: 03-25-1986 (35 y.o. [name] Primary Care Raed Schalk: [name] Other Clinician: Referring Zaccheus Edmister: Treating Carlette Palmatier/Extender: Greer Ee in Treatment: 46 Verbal / Phone Orders: No Diagnosis Coding Follow-up Appointments ppointment in 1 week. - ***60 minutes for cast*** - with Dr. Leanord Hawking Return A Bathing/ Shower/ Hygiene May shower with protection but do not get wound dressing(s) wet. - use a cast protector to left leg. Edema Control - Lymphedema / SCD / Other Bilateral Lower Extremities Elevate legs to the level of the heart or above for 30 minutes daily and/or when sitting, a frequency of: - throughout the day Avoid standing for long periods of time. Exercise regularly Moisturize legs daily. - right leg every night before bed. Compression stocking or Garment 20-30 mm/Hg pressure to: - Apply to right leg in the morning and remove at night. Off-Loading Total Contact Cast to Left Lower Extremity Open toe surgical shoe to: - right foot Wound Treatment Wound #3 - Foot Wound Laterality: Left, Lateral Cleanser: Soap and Water 1 x Per Week/30 Days Discharge Instructions: May shower and wash wound with dial antibacterial soap and water prior to dressing change. Peri-Wound Care: Zinc Oxide Ointment 30g tube 1 x Per Week/30 Days Discharge Instructions: Apply Zinc Oxide to periwound with each dressing change as needed. Prim Dressing: KerraCel Ag Gelling Fiber Dressing, 2x2 in (silver alginate) 1 x Per Week/30 Days ary Discharge Instructions: Apply silver alginate to wound bed as instructed Secondary Dressing: Woven Gauze Sponge, Non-Sterile 4x4 in 1 x Per Week/30 Days Discharge Instructions: Apply over primary dressing as  directed. Secondary Dressing: ABD Pad, 8x10 1 x Per Week/30 Days Discharge Instructions: T down before applying kerlix. Apply over primary dressing as directed. ape Secondary Dressing: Zetuvit Plus 4x8 in 1 x Per Week/30 Days Discharge Instructions: Apply over primary dressing as directed. Secured With: [employer], 4.5x3.1 (in/yd) 1 x Per Week/30 Days Discharge Instructions: Secure with Kerlix as directed. Wound #5 - Metatarsal head fifth Wound Laterality: Right, Lateral Cleanser: Normal Saline (Generic) Every Other Day/15 Days Discharge Instructions: Cleanse the wound with Normal Saline prior to applying a clean dressing using gauze sponges, not tissue or cotton balls. Cleanser: Soap and Water Every Other Day/15 Days Discharge Instructions: May shower and wash wound with dial antibacterial soap and water prior to dressing change. Prim Dressing: KerraCel Ag Gelling Fiber Dressing, 2x2 in (silver alginate) (Generic) Every Other Day/15 Days ary Discharge Instructions: Apply silver alginate to wound bed as instructed Secondary Dressing: ABD Pad, 8x10 (Generic) Every Other Day/15 Days Discharge Instructions: Apply over primary dressing as directed. Secondary Dressing: Optifoam Non-Adhesive Dressing, 4x4 in (Generic) Every Other Day/15 Days Discharge Instructions: Apply over primary dressing, cut to make foam donut to offload Secured With: Kerlix Roll Sterile, 4.5x3.1 (in/yd) (Generic) Every Other Day/15 Days Discharge Instructions: Secure with Kerlix as directed. Secured With: 7M Medipore H Soft Cloth Surgical Tape, 2x2 (in/yd) (Generic) Every Other Day/15 Days Discharge Instructions: Secure dressing with tape as directed. Wound #7 - Ankle Wound Laterality: Right, Anterior Cleanser: Normal Saline (Generic) Every Other Day/15 Days Discharge Instructions: Cleanse the wound with Normal Saline prior to applying a clean dressing using gauze sponges, not tissue or cotton balls. Cleanser:  Soap and Water Every Other Day/15 Days Discharge Instructions: May shower and wash wound with  dial antibacterial soap and water prior to dressing change. Prim Dressing: KerraCel Ag Gelling Fiber Dressing, 2x2 in (silver alginate) (Generic) Every Other Day/15 Days ary Discharge Instructions: Apply silver alginate to wound bed as instructed Secondary Dressing: Woven Gauze Sponge, Non-Sterile 4x4 in (Generic) Every Other Day/15 Days Discharge Instructions: Apply over primary dressing as directed. Secondary Dressing: ABD Pad, 8x10 (Generic) Every Other Day/15 Days Discharge Instructions: Apply over primary dressing as directed. Secured With: [employer], 4.5x3.1 (in/yd) (Generic) Every Other Day/15 Days Discharge Instructions: Secure with Kerlix as directed. Secured With: 29M Medipore H Soft Cloth Surgical Tape, 2x2 (in/yd) (Generic) Every Other Day/15 Days Discharge Instructions: Secure dressing with tape as directed. Electronic Signature(s) Signed: 09/11/2020 2:52:34 PM By: Antonieta Iba Entered By: Antonieta Iba on 09/11/2020 14:52:32 -------------------------------------------------------------------------------- Problem List Details Patient Name: Date of Service: [name], [name] 09/11/2020 2:45 PM Medical Record Number: [id number] Patient Account Number: [id number] Date of Birth/Sex: Treating RN: 1985-06-20 (35 y.o. 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 08/10/2020 No Yes Inactive Problems ICD-10 Code Description Active Date Inactive Date L97.518 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ity 10/24/2019 10/24/2019 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, right ankle and foot 10/24/2019 10/24/2019 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, left ankle and foot 10/24/2019 10/24/2019 B95.62 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 10/24/2019 10/24/2019 classified elsewhere Resolved Problems Electronic Signature(s) Signed: 09/11/2020 6:16:40 PM By: Zenaida Deed RN, BSN Entered By: Zenaida Deed on 09/11/2020 15:11:56 -------------------------------------------------------------------------------- Total Contact Cast Details Patient Name: Date of Service: [name] 09/11/2020 2:45 PM Medical Record Number: [id number] Patient Account Number: [id number] Date of Birth/Sex: Treating RN: [birth year] (35 y.o. [name] Primary Care Darden Flemister: [name] Other Clinician: Referring Bebe Moncure: Treating June Rode/Extender: Greer Ee in Treatment: 40 T Contact Cast Applied for Wound Assessment: otal Wound #3 Left,Lateral Foot Performed By: Physician Maxwell Caul., MD Post Procedure Diagnosis Same as Pre-procedure Electronic Signature(s) Signed: 09/11/2020 2:52:06 PM By: Antonieta Iba Entered By: Antonieta Iba on 09/11/2020 14:52:06 -------------------------------------------------------------------------------- SuperBill Details Patient Name: Date of Service: [name], [name] 09/11/2020 Medical Record Number: [id number] Patient Account Number: [id number] Date of Birth/Sex: Treating RN: 1985-07-21 (35 y.o. 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 Facility Procedures CPT4 Code: [id number] Description: [phone] - APPLY TOTAL CONTACT LEG CAST ICD-10 Diagnosis Description L97.528 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 Modifier: ity Quantity: 1 Physician Procedures : CPT4 Code Description Modifier [id number] - WC PHYS APPLY TOTAL CONTACT CAST ICD-10 Diagnosis Description L97.528 Non-pressure chronic ulcer of other part of left foot  with other specified severity Quantity: 1 Electronic Signature(s) Signed: 09/11/2020 3:58:24 PM By: Antonieta Iba Entered By: Antonieta Iba on 09/11/2020 15:58:23

## 2020-09-21 ENCOUNTER — Other Ambulatory Visit: Payer: Self-pay

## 2020-09-21 ENCOUNTER — Encounter (HOSPITAL_BASED_OUTPATIENT_CLINIC_OR_DEPARTMENT_OTHER): Payer: BC Managed Care – PPO | Admitting: Internal Medicine

## 2020-09-21 DIAGNOSIS — E11621 Type 2 diabetes mellitus with foot ulcer: Secondary | ICD-10-CM | POA: Diagnosis not present

## 2020-09-21 NOTE — Progress Notes (Signed)
[place], [name] ([id number]) Visit Report for 09/21/2020 Debridement Details Patient Name: Date of Service: [name] 09/21/2020 3:30 PM Medical Record Number: [id number] Patient Account Number: [id number] Date of Birth/Sex: Treating RN: 1986-03-01 (35 y.o. [name] Primary Care Provider: [name] Other Clinician: Referring Provider: Treating Provider/Extender: [name] in Treatment: 47 Debridement Performed for Assessment: Wound #3 Left,Lateral Foot Performed By: Physician [name], MD Debridement Type: Debridement Severity of Tissue Pre Debridement: Fat layer exposed Level of Consciousness (Pre-procedure): Awake and Alert Pre-procedure Verification/Time Out Yes - 16:30 Taken: Start Time: 16:30 T Area Debrided (L x W): otal 2.4 (cm) x 1.8 (cm) = 4.32 (cm) Tissue and other material debrided: Viable, Non-Viable, Callus, Subcutaneous Level: Skin/Subcutaneous Tissue Debridement Description: Excisional Instrument: Curette Bleeding: Moderate Hemostasis Achieved: Silver Nitrate End Time: 16:32 Procedural Pain: 0 Post Procedural Pain: 0 Response to Treatment: Procedure was tolerated well Level of Consciousness (Post- Awake and Alert procedure): Post Debridement Measurements of Total Wound Length: (cm) 2.4 Width: (cm) 4.8 Depth: (cm) 0.3 Volume: (cm) 2.714 Character of Wound/Ulcer Post Debridement: Improved Severity of Tissue Post Debridement: Fat layer exposed Post Procedure Diagnosis Same as Pre-procedure Electronic Signature(s) Signed: 09/21/2020 5:10:27 PM By: [name] MD Signed: 09/21/2020 5:37:12 PM By: [name] RN, BSN Entered By: [name] on 09/21/2020 16:46:45 -------------------------------------------------------------------------------- Debridement Details Patient Name: Date of Service: [name], [name] 09/21/2020 3:30 PM Medical Record Number: [id number] Patient Account Number:  [id number] Date of Birth/Sex: Treating RN: 1985/09/03 (35 y.o. [name] Primary Care Provider: [name] Other Clinician: Referring Provider: Treating Provider/Extender: [name] in Treatment: 47 Debridement Performed for Assessment: Wound #5 Right,Lateral Metatarsal head fifth Performed By: Physician [name], MD Debridement Type: Debridement Severity of Tissue Pre Debridement: Fat layer exposed Level of Consciousness (Pre-procedure): Awake and Alert Pre-procedure Verification/Time Out Yes - 16:30 Taken: Start Time: 16:30 T Area Debrided (L x W): otal 2.4 (cm) x 2.7 (cm) = 6.48 (cm) Tissue and other material debrided: Viable, Non-Viable, Callus, Subcutaneous Level: Skin/Subcutaneous Tissue Debridement Description: Excisional Instrument: Curette Bleeding: Moderate Hemostasis Achieved: Silver Nitrate End Time: 16:32 Procedural Pain: 0 Post Procedural Pain: 0 Response to Treatment: Procedure was tolerated well Level of Consciousness (Post- Awake and Alert procedure): Post Debridement Measurements of Total Wound Length: (cm) 2.4 Width: (cm) 2.7 Depth: (cm) 0.4 Volume: (cm) 2.036 Character of Wound/Ulcer Post Debridement: Improved Severity of Tissue Post Debridement: Fat layer exposed Post Procedure Diagnosis Same as Pre-procedure Electronic Signature(s) Signed: 09/21/2020 5:10:27 PM By: [name] MD Signed: 09/21/2020 5:37:12 PM By: [name] RN, BSN Entered By: [name] on 09/21/2020 16:46:55 -------------------------------------------------------------------------------- HPI Details Patient Name: Date of Service: [name], [name] 09/21/2020 3:30 PM Medical Record Number: [id number] Patient Account Number: [id number] Date of Birth/Sex: Treating RN: 11/16/1985 (35 y.o. 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 He comes in for review of a wound over the plantar fifth metatarsal head extending into the lateral part of the foot. He was followed for this previously by his podiatrist Dr. Cornelius Moras. As the patient tells his story he went to see podiatry first for a swelling he developed on the lateral part of his fifth metatarsal head in May. He states this was "  open" by podiatry and the area closed. He was followed up in June and it was again opened callus removed and it closed promptly. There were plans being made for surgery on the fifth metatarsal head in June however his blood sugar was apparently too high for anesthesia. Apparently the area was debrided and opened again in June and it is never closed since. Looking over the records from podiatry I am really not able to follow this. It was clear when he was first seen it was before 5/14 at that point he already had a wound. By 5/17 the ulcer was resolved. I do not see anything about a procedure. On 5/28 noted to have pre-ulcerative moderate keratosis. X-ray noted 1/5 contracted toe and tailor's bunion and metatarsal deformity. On a visit date on 09/28/2018 the dorsal part of the left foot it healed and resolved. There was concern about swelling in his lower extremity he was sent to the ER.. As far as I can tell he was seen in the ER on 7/12 with an ulcer on his left foot. A DVT rule out of the left leg was negative. I do not think I have complete records from podiatry but I am not able to verify the procedures this patient states he had. He states after the last procedure the wound has never closed although I am not able to follow this in the records I have from podiatry. He has not had a recent x-ray The patient has been using Neosporin on the wound. He is wearing a  Darco shoe. He is still very active up on his foot working and exercising. Past medical history; type 2 diabetes ketosis-prone, leg swelling with a negative DVT study in July. Non-smoker ABI in our clinic was 0.85 on the left 10/16; substantial wound on the plantar left fifth met head extending laterally almost to the dorsal fifth MTP. We have been using silver alginate we gave him a Darco forefoot off loader. An x-ray did not show evidence of osteomyelitis did note soft tissue emphysema which I think was due to gas tracking through an open wound. There is no doubt in my mind he requires an MRI 10/23; MRI not booked until 3 November at the earliest this is largely due to his glucose sensor in the right arm. We have been using silver alginate. There has been an improvement 10/29; I am still not exactly sure when his MRI is booked for. He says it is the third but it is the 10th in epic. This definitely needs to be done. He is running a low-grade fever today but no other symptoms. No real improvement in the 1 02/26/2019 patient presents today for a follow-up visit here in our clinic he is last been seen in the clinic on October 29. Subsequently we were working on getting MRI to evaluate and see what exactly was going on and where we would need to go from the standpoint of whether or not he had osteomyelitis and again what treatments were going be required. Subsequently the patient ended up being admitted to the hospital on 02/07/2019 and was discharged on 02/14/2019. This is a somewhat interesting admission with a discharge diagnosis of pneumonia due to COVID-19 although he was positive for COVID-19 when tested at the urgent care but negative x2 when he was actually in the hospital. With that being said he did have acute respiratory failure with hypoxia and it was noted he also have a left foot ulceration with osteomyelitis. With that  being said he did require oxygen for his pneumonia and I level 4 L. He  was placed on antivirals and steroids for the COVID-19. He was also transferred to the [facility] at one point. Nonetheless he did subsequently discharged home and since being home has done much better in that regard. The CT angiogram did not show any pulmonary embolism. With regard to the osteomyelitis the patient was placed on vancomycin and Zosyn while in the hospital but has been changed to Augmentin at discharge. It was also recommended that he follow- up with wound care and podiatry. Podiatry however wanted him to see [name] according to the patient prior to them doing anything further. His hemoglobin A1c was 9.9 as noted in the hospital. Have an MRI of the left foot performed while in the hospital on 02/04/2019. 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 There is an overlying plantar open wound noted an abscess tracking back along the lateral aspect of the fifth metatarsal shaft. There is otherwise diffuse cellulitis and mild fasciitis without findings of polymyositis. The patient did have recently pneumonia secondary to COVID-19 I looked in the chart through epic and it does appear that the patient may need to have an additional x-ray just to ensure everything is cleared and that he has no airspace disease prior to putting him into the chamber. 03/05/2019; patient was readmitted to the clinic last week. He was hospitalized twice for a viral upper respiratory tract infection from 11/1 through 11/4 and then 11/5 through 11/12 ultimately this turned out to be Covid pneumonitis. Although he was discharged on oxygen he is not using it. He says he feels fine. He has no exercise limitation no cough no sputum. His O2 sat in our clinic today was 100% on room air. He did manage to have his MRI which showed septic arthritis at the fifth MTP joint and osteomyelitis involving the fifth metatarsal head and  proximal phalanx. He received Vanco and Zosyn in the hospital and then was discharged on 2 weeks of Augmentin. I do not see any relevant cultures. He was supposed to follow-up with infectious disease but I do not see that he has an appointment. 12/8; patient saw Dr. Novella Olive of infectious disease last week. He felt that he had had adequate antibiotic therapy. He did not go to follow-up with Dr. Amalia Hailey of podiatry and I have again talked to him about the pros and cons of this. He does not want to consider a ray amputation of this time. He is aware of the risks of recurrence, migration etc. He started HBO today and tolerated this well. He can complete the Augmentin that I gave him last week. I have looked over the lab work that Dr. Chana Bode ordered his C-reactive protein was 3.3 and his sedimentation rate was 17. The C-reactive protein is never really been measurably that high in this patient 12/15; not much change in the wound today however he has undermining along the lateral part of the foot again more extensively than last week. He has some rims of epithelialization. We have been using silver alginate. He is undergoing hyperbarics but did not dive today 12/18; in for his obligatory first total contact cast change. Unfortunately there was pus coming from the undermining area around his fifth metatarsal head. This was cultured but will preclude reapplication of a cast. He is seen in conjunction with HBO 12/24; patient had staph  lugdunensis in the wound in the undermining area laterally last time. We put him on doxycycline which should have covered this. The wound looks better today. I am going to give him another week of doxycycline before reattempting the total contact cast 12/31; the patient is completing antibiotics. Hemorrhagic debris in the distal part of the wound with some undermining distally. He also had hyper granulation. Extensive debridement with a #5 curette. The infected area that was on the  lateral part of the fifth met head is closed over. I do not think he needs any more antibiotics. Patient was seen prior to HBO. Preparations for a total contact cast were made in the cast will be placed post hyperbarics 04/11/19; once again the patient arrives today without complaint. He had been in a cast all week noted that he had heavy drainage this week. This resulted in large raised areas of macerated tissue around the wound 1/14; wound bed looks better slightly smaller. Hydrofera Blue has been changing himself. He had a heavy drainage last week which caused a lot of maceration around the wound so I took him out of a total contact cast he says the drainage is actually better this week He is seen today in conjunction with HBO 1/21; returns to clinic. He was up in [place] for a day or 2 attending a funeral. He comes back in with the wound larger and with a large area of exposed bone. He had osteomyelitis and septic arthritis of the fifth left metatarsal head while he was in hospital. He received IV antibiotics in the hospital for a prolonged period of time then 3 weeks of Augmentin. Subsequently I gave him 2 weeks of doxycycline for more superficial wound infection. When I saw this last week the wound was smaller the surface of the wound looks satisfactory. 1/28; patient missed hyperbarics today. Bone biopsy I did last time showed Enterococcus faecalis and Staphylococcus lugdunensis . He has a wide area of exposed bone. We are going to use silver alginate as of today. I had another ethical discussion with the patient. 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 In this situation I think the likelihood of healing this is low. Therefore I have recommended a ray amputation and with the patient's agreement I have referred him to Dr. Doran Durand. The other issue is that his compliance with hyperbarics has been minimal because of his work schedule and given his underlying  decision I am going to stop this today READMISSION 10/24/2019 MRI 09/29/2019 left foot IMPRESSION: 1. Apparent skin ulceration inferior and lateral to the 5th metatarsal base with underlying heterogeneous T2 signal and enhancement in the subcutaneous fat. Small peripherally enhancing fluid collections along the plantar and lateral aspects of the 5th metatarsal base suspicious for abscesses. 2. Interval amputation through the mid 5th metatarsal with nonspecific low-level marrow edema and enhancement. Given the proximity to the adjacent soft tissue inflammatory changes, osteomyelitis cannot be excluded. 3. The additional bones appear unremarkable. MRI 09/29/2019 right foot IMPRESSION: 1. Soft tissue ulceration lateral to the 5th MTP joint. There is low-level T2 hyperintensity within the 4th and 5th metatarsal heads and adjacent proximal phalanges without abnormal T1 signal or cortical destruction. These findings are nonspecific and could be seen with early marrow edema, hyperemia or early osteomyelitis. No evidence of septic joint. 2. Mild tenosynovitis and synovial enhancement associated with the extensor digitorum tendons at the level of the midfoot. 3. Diffuse low-level muscular T2 hyperintensity and enhancement, most consistent with  diabetic myopathy. LEFT FOOT BONE Methicillin resistant staphylococcus aureus Staphylococcus lugdunensis MIC MIC CIPROFLOXACIN >=8 RESISTANT Resistant <=0.5 SENSI... Sensitive CLINDAMYCIN <=0.25 SENS... Sensitive >=8 RESISTANT Resistant ERYTHROMYCIN >=8 RESISTANT Resistant >=8 RESISTANT Resistant GENTAMICIN <=0.5 SENSI... Sensitive <=0.5 SENSI... Sensitive Inducible Clindamycin NEGATIVE Sensitive NEGATIVE Sensitive OXACILLIN >=4 RESISTANT Resistant 2 SENSITIVE Sensitive RIFAMPIN <=0.5 SENSI... Sensitive <=0.5 SENSI... Sensitive TETRACYCLINE <=1 SENSITIVE Sensitive <=1 SENSITIVE Sensitive TRIMETH/SULFA <=10 SENSIT Sensitive <=10 SENSIT Sensitive ...  Marland Kitchen.. VANCOMYCIN 1 SENSITIVE Sensitive <=0.5 SENSI... Sensitive Right foot bone . Component 3 wk ago Specimen Description BONE Special Requests RIGHT 4 METATARSAL SAMPLE B Gram Stain NO WBC SEEN NO ORGANISMS SEEN Culture RARE METHICILLIN RESISTANT STAPHYLOCOCCUS AUREUS NO ANAEROBES ISOLATED Performed at [facility], [place] [address] [place], [place] [address] Report Status 10/08/2019 FINAL Organism ID, Bacteria METHICILLIN RESISTANT STAPHYLOCOCCUS AUREUS Resulting Agency [facility] Susceptibility Methicillin resistant staphylococcus aureus MIC CIPROFLOXACIN >=8 RESISTANT Resistant CLINDAMYCIN <=0.25 SENS... Sensitive ERYTHROMYCIN >=8 RESISTANT Resistant GENTAMICIN <=0.5 SENSI... Sensitive Inducible Clindamycin NEGATIVE Sensitive OXACILLIN >=4 RESISTANT Resistant RIFAMPIN <=0.5 SENSI... Sensitive TETRACYCLINE <=1 SENSITIVE Sensitive TRIMETH/SULFA <=10 SENSIT Sensitive ... VANCOMYCIN 1 SENSITIVE Sensitive This is a patient we had in clinic earlier this year with a wound over his left fifth metatarsal head. 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 Doran Durand on 05/16/2019. He seems to have developed problems with wounds on his bilateral feet in June 2021 although he may have had problems earlier than this. He was in an urgent care with a right foot ulcer on 09/26/2019 and given a course of doxycycline. This was apparently after having trouble getting into see orthopedics. He was seen by podiatry on 09/28/2019 noted to have bilateral lower extremity ulcers including the left lateral fifth metatarsal base and the right subfifth met head. It was noted that had purulent drainage at that time. He required  hospitalization from 6/20 through 7/2. This was because of worsening right foot wounds. He underwent bilateral operative incision and drainage and bone biopsies bilaterally. Culture results are listed above. He has been referred back to clinic by Dr. Jacqualyn Posey of podiatry. He is also followed by Dr. Megan Salon who saw him yesterday. He was discharged from hospital on Zyvox Flagyl and Levaquin and yesterday changed to doxycycline Flagyl and Levaquin. His inflammatory markers on 6/26 showed a sedimentation rate of 129 and a C-reactive protein of 5. This is improved to 14 and 1.3 respectively. This would indicate improvement. ABIs in our clinic today were 1.23 on the right and 1.20 on the left 11/01/2019 on evaluation today patient appears to be doing fairly well in regard to the wounds on his feet at this point. Fortunately there is no signs of active infection at this time. No fevers, chills, nausea, vomiting, or diarrhea. He currently is seeing infectious disease and still under their care at this point. Subsequently he also has both wounds which she has not been using collagen on as he did not receive that in his packaging he did not call us and let us know that. Apparently that just was missed on the order. Nonetheless we will get that straightened out today. 8/9-Patient returns for bilateral foot wounds, using Prisma with hydrogel moistened dressings, and the wounds appear stable. Patient using surgical  shoes, avoiding much pressure or weightbearing as much as possible 8/16; patient has bilateral foot wounds. 1 on the right lateral foot proximally the other is on the left mid lateral foot. Both required debridement of callus and thick skin around the wounds. We have been using silver collagen 8/27; patient has bilateral lateral foot wounds. The area on the left substantially surrounded by callus and dry skin. This was removed from the wound edge. The underlying wound is small. The area on the right  measured somewhat smaller today. 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 Unfortunately I did not update his antibiotics during today's visit. 9/10 I reviewed Dr. Hale Bogus last notes he felt he had completed antibiotics his inflammatory markers were reasonably well controlled. He has a small wound on the lateral left foot and a tiny area on the right which is just above closed. He is using Hydrofera Blue with border foam he has bilateral surgical shoes 9/24; 2 week f/u. doing well. right foot is closed. left foot still undermined. 10/14; right foot remains closed at the fifth met head. The area over the base of the left fifth metatarsal has a small open area but considerable undermining towards the plantar foot. Thick callus skin around this suggests an adequate pressure relief. We have talked about this. He says he is going to go back into his cam boot. I suggested a total contact cast he did not seem enamored with this suggestion 10/26; left foot base of the fifth metatarsal. Same condition as last time. He has skin over the area with an open wound however the skin is not adherent. He went to see Dr. Earleen Newport who did an x-ray and culture of his foot I have not reviewed the x-ray but the patient was not told anything. He is on doxycycline 11/11; since the patient was last here he was in the emergency room on 10/30 he was concerned about swelling in the left foot. They did not do any cultures or x-rays. They changed his antibiotics to cephalexin. Previous culture showed group B strep. The cephalexin is appropriate as doxycycline has less than predictable coverage. Arrives in clinic today with swelling over this area under the wound. He also has a new wound on the right fifth metatarsal head 11/18; the patient has a difficult wound on the lateral aspect of the left fifth metatarsal head. The wound was almost ballotable last week I  opened it slightly expecting to see purulence however there was just bleeding. I cultured this this was negative. X-ray unchanged. We are trying to get an MRI but I am not sure were going to be able to get this through his insurance. He also has an area on the right lateral fifth metatarsal head this looks healthier 12/3; the patient finally got our MRI. Surprisingly this did not show osteomyelitis. I did show the soft tissue ulceration at the lateral plantar aspect of the fifth metatarsal base with a tiny residual 6 mm abscess overlying the superficial fascia I have tried to culture this area I have not been able to get this to grow anything. Nevertheless the protruding tissue looks aggravated. I suspect we should try to treat the underlying "abscess with broad-spectrum antibiotics. I am going to start him on Levaquin and Flagyl. He has much less edema in his legs and I am going to continue to wrap his legs and see him weekly 12/10. I started Levaquin and Flagyl on  him last week. He just picked up the Flagyl apparently there was some delay. The worry is the wound on the left fifth metatarsal base which is substantial and worsening. His foot looks like he inverts at the ankle making this a weightbearing surface. Certainly no improvement in fact I think the measurements of this are somewhat worse. We have been using 12/17; he apparently just got the Levaquin yesterday this is 2 weeks after the fact. He has completed the Flagyl. The area over the left fifth metatarsal base still has protruding granulation tissue although it does not look quite as bad as it did some weeks ago. He has severe bilateral lymphedema although we have not been treating him for wounds on his legs this is definitely going to require compression. There was so much edema in the left I did not wish to put him in a total contact cast today. I am going to increase his compression from 3-4 layer. The area on the right lateral fifth met  head actually look quite good and superficial. 12/23; patient arrived with callus on the right fifth met head and the substantial hyper granulated callused wound on the base of his fifth metatarsal. He says he is completing his Levaquin in 2 days but I do not think that adds up with what I gave him but I will have to double check this. We are using Hydrofera Blue on both areas. My plan is to put the left leg in a cast the week after New Year's 04/06/2020; patient's wounds about the same. Right lateral fifth metatarsal head and left lateral foot over the base of the fifth metatarsal. There is undermining on the left lateral foot which I removed before application of total contact cast continuing with Hydrofera Blue new. Patient tells me he was seen by endocrinology today lab work was done [Dr. Kerr]. Also wondering whether he was referred to cardiology. I went over some lab work from previously does not have chronic renal failure certainly not nephrotic range proteinuria he does have very poorly controlled diabetes but this is not his most updated lab work. Hemoglobin A1c has been over 11 1/10; the patient had a considerable amount of leakage towards mid part of his left foot with macerated skin however the wound surface looks better the area on the right lateral fifth met head is better as well. I am going to change the dressing on the left foot under the total contact cast to silver alginate, continue with Hydrofera Blue on the right. 1/20; patient was in the total contact cast for 10 days. Considerable amount of drainage although the skin around the wound does not look too bad on the left foot. The area on the right fifth metatarsal head is closed. Our nursing staff reports large amount of drainage out of the left lateral foot wound 1/25; continues with copious amounts of drainage described by our intake staff. PCR culture I did last week showed E. coli and Enterococcus faecalis and low quantities.  Multiple resistance genes documented including extended spectrum beta lactamase, MRSA, MRSE, quinolone, tetracycline. The wound is not quite as good this week as it was 5 days ago but about the same size 2/3; continues with copious amounts of malodorous drainage per our intake nurse. The PCR culture I did 2 weeks ago showed E. coli and low quantities of Enterococcus. There were multiple resistance genes detected. I put Neosporin on him last week although this does not seem to have helped. The wound is  slightly deeper today. Offloading continues to be an issue here although with the amount of drainage she has a total contact cast is just not going to work 2/10; moderate amount of drainage. Patient reports he cannot get his stocking on over the dressing. I told him we have to do that the nurse gave him suggestions on how to make this work. The wound is on the bottom and lateral part of his left foot. Is cultured predominantly grew low amounts of Enterococcus, E. coli and anaerobes. There were multiple resistance genes detected including extended spectrum beta lactamase, quinolone, tetracycline. I could not think of an easy oral combination to address this so for now I am going to do topical antibiotics provided by [facility] I think the main agents here are vancomycin and an aminoglycoside. We have to be able to give him access to the wounds to get the topical antibiotic on 2/17; moderate amount of drainage this is unchanged. He has his Keystone topical antibiotic against the deep tissue culture organisms. He has been using this and changing the dressing daily. Silver alginate on the wound surface. 2/24; using Keystone antibiotic with silver alginate on the top. He had too much drainage for a total contact cast at one point although I think that is improving and I think in the next week or 2 it might be possible to replace a total contact cast I did not do this today. In general the wound surface looks  healthy however he continues to have thick rims of skin and subcutaneous tissue around the wide area of the circumference which I debrided 06/04/2020 upon evaluation today patient appears to be doing well in regard to his wound. I do feel like he is showing signs of improvement. There is little bit of callus and dead tissue around the edges of the wound as well as what appears to be a little bit of a sinus tract that is off to the side laterally I would perform debridement to clear that away today. 3/17; left lateral foot. The wound looks about the same as I remember. Not much depth surface looks healthy. No evidence of infection 3/25; left lateral foot. Wound surface looks about the same. Separating epithelium from the circumference. There really is no evidence of infection here however not making progress by my view 3/29; left lateral foot. Surface of the wound again looks reasonably healthy still thick skin and subcutaneous tissue around the wound margins. There is no evidence of infection. One of the concerns being brought up by the nurses has again the amount of drainage vis--vis continued use of a total contact cast 4/5; left lateral foot at roughly the base of the fifth metatarsal. Nice healthy looking granulated tissue with rims of epithelialization. The overall wound measurements are not any better but the tissue looks healthy. The only concern is the amount of drainage although he has no surrounding maceration with what we have been doing recently to absorb fluid and protect his skin. He also has lymphedema. 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 He states that the kerlix/coban under the TCC rolled down and could not get it back up. He has  been using an offloading scooter and has somehow hurt his right  foot using this device. This happened last week. He states that the side of his right foot developed a blister and opened. The top of his foot also has a few small open wounds he thinks is due to his socks rubbing in his shoes. He has not been using any dressings to the wound. He denies purulent drainage, fever/chills or erythema to the wounds. 4/22; patient presents for 1 week follow-up. He developed new wounds to the right foot that were evaluated at last clinic visit. He continues to have a total contact cast to the left leg and he reports no issues. He has been using silver collagen to the right foot wounds with no issues. He denies purulent drainage, fever/chills or erythema to the right foot wounds. He has no complaints today 4/25; patient presents for 1 week follow-up. He has a total contact cast of the left leg and reports no issues. He has been using silver alginate to the right foot wound. He denies purulent drainage, fever/chills or erythema to the right foot wounds. 5/2 patient presents for 1 week follow-up. T contact cast on the left. The wound which is on the base of the plantar foot at the base of the fifth metatarsal otal actually looks quite good and dimensions continue to gradually contract. HOWEVER the area on the right lateral fifth metatarsal head is much larger than what I remember from 2 weeks ago. Once more is he has significant levels of hypergranulation. Noteworthy that he had this same hyper granulated response on his wound on the left foot at one point in time. So much so that he I thought there was an underlying fluid collection. Based on this I think this just needs debridement. 5/9; the wound on the left actually continues to be gradually smaller with a healthy surface. Slight amount of drainage and maceration of the skin around but not too bad. However he has a large wound over the right fifth metatarsal  head very much in the same configuration as his left foot wound was initially. I used silver nitrate to address the hyper granulated tissue no mechanical debridement 5/16; area on the left foot did not look as healthy this week deeper thick surrounding macerated skin and subcutaneous tissue. The area on the right foot fifth met head was about the same The area on the right ankle that we identified last week is completely broken down into an open wound presumably a stocking rubbing issue 5/23; patient has been using a total contact cast to the left side. He has been using silver alginate underneath. He has also been using silver alginate to the right foot wounds. He has no complaints today. He denies any signs of infection. 5/31; the left-sided wound looks some better measure smaller surface granulation looks better. We have been using silver alginate under the total contact cast The large area on his right fifth met head and right dorsal foot look about the same still using silver alginate 6/6; neither side is good as I was hoping although the surface area dimensions are better. A lot of maceration on his left and right foot around the wound edge. Area on the dorsal right foot looks better. He says he was traveling. I am not sure what does the amount of maceration around the plantar wounds may be drainage issues 6/13; in general the wound surfaces look quite good on both sides. Macerated skin and raised edges around the wound required debridement although in general especially on the left the  surface area seems improved. The area on the right dorsal ankle is about the same I thought this would not be such a problem to close 6/20; not much change in either wound although the one on the right looks a little better. Both wounds have thick macerated edges to the skin requiring debridements. We have been using silver alginate. The area on the dorsal right ankle is still open I thought this would be  closed. Electronic Signature(s) Signed: 09/21/2020 5:10:27 PM By: [name] MD Entered By: [name] on 09/21/2020 16:47:52 -------------------------------------------------------------------------------- Physical Exam Details Patient Name: Date of Service: [name], [name] 09/21/2020 3:30 PM Medical Record Number: [id number] Patient Account Number: [id number] Date of Birth/Sex: Treating RN: [birth year] (35 y.o. [name] Primary Care Provider: [name] Other Clinician: Referring Provider: Treating Provider/Extender: [name] in Treatment: 47 Constitutional Sitting or standing Blood Pressure is within target range for patient.. Pulse regular and within target range for patient.Marland Kitchen Respirations regular, non-labored and within target range.. Temperature is normal and within the target range for the patient.Marland Kitchen Appears in no distress. Cardiovascular Bilateral lymphedema marginally controlled. Notes Wound exam; left lateral a lot of maceration thick raised skin around the wound. Actually the area on the right looks somewhat smaller but with still the thick macerated skin tissue around the wound edge. I used a #5 curette to debride both wounds hemostasis with silver nitrate and direct pressure. I am trying to get a better wound edge. The left lateral ankle again is about the same Electronic Signature(s) Signed: 09/21/2020 5:10:27 PM By: [name] MD Entered By: [name] on 09/21/2020 16:49:03 -------------------------------------------------------------------------------- Physician Orders Details Patient Name: Date of Service: [name], [name] 09/21/2020 3:30 PM Medical Record Number: [id number] Patient Account Number: [id number] Date of Birth/Sex: Treating RN: 09-Jan-1986 (35 y.o. 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 - ***60 minutes for cast*** - with Dr. Dellia Nims Return A Bathing/ Shower/ Hygiene May shower with protection but do not get wound dressing(s) wet. - use a cast protector to left leg. Edema Control - Lymphedema / SCD / Other Bilateral Lower Extremities Elevate legs to the level of the heart or above for 30 minutes daily and/or when sitting, a frequency of: - throughout the day Avoid standing for long periods of time. Exercise regularly Moisturize legs daily. - right leg every night before bed. Compression stocking or Garment 20-30 mm/Hg pressure to: - Apply to right leg in the morning and remove at night. Off-Loading Total Contact Cast to Left Lower Extremity Open toe surgical shoe to: - right foot Wound Treatment Wound #3 - Foot Wound Laterality: Left, Lateral Cleanser: Soap and Water 1 x Per Week/30 Days Discharge Instructions: May shower and wash wound with dial antibacterial soap and water prior to dressing change. Peri-Wound Care: Zinc Oxide Ointment 30g tube 1 x Per Week/30 Days Discharge Instructions: Apply Zinc Oxide to periwound with each dressing change as needed. Prim Dressing: KerraCel Ag Gelling Fiber Dressing, 2x2 in (silver alginate) 1 x  Per Week/30 Days ary Discharge Instructions: Apply silver alginate to wound bed as instructed Secondary Dressing: Woven Gauze Sponge, Non-Sterile 4x4 in 1 x Per Week/30 Days Discharge Instructions: Apply over primary dressing as directed. Secondary Dressing: ABD Pad, 8x10 1 x Per  Week/30 Days Discharge Instructions: T down before applying kerlix. Apply over primary dressing as directed. ape Secondary Dressing: Zetuvit Plus 4x8 in 1 x Per Week/30 Days Discharge Instructions: Apply over primary dressing as directed. Secured With: [employer], 4.5x3.1 (in/yd) 1 x Per Week/30 Days Discharge Instructions: Secure with Kerlix as directed. Compression Wrap: Kerlix Roll 4.5x3.1 (in/yd) 1 x Per Week/30 Days Discharge Instructions: Apply Kerlix and Coban compression under TCC. Compression Wrap: Coban Self-Adherent Wrap 4x5 (in/yd) 1 x Per Week/30 Days Discharge Instructions: Apply over Kerlix as directed. Wound #5 - Metatarsal head fifth Wound Laterality: Right, Lateral Cleanser: Normal Saline (Generic) Every Other Day/15 Days Discharge Instructions: Cleanse the wound with Normal Saline prior to applying a clean dressing using gauze sponges, not tissue or cotton balls. Cleanser: Soap and Water Every Other Day/15 Days Discharge Instructions: May shower and wash wound with dial antibacterial soap and water prior to dressing change. Prim Dressing: KerraCel Ag Gelling Fiber Dressing, 2x2 in (silver alginate) (Generic) Every Other Day/15 Days ary Discharge Instructions: Apply silver alginate to wound bed as instructed Secondary Dressing: ABD Pad, 5x9 (DME) (Generic) Every Other Day/15 Days Discharge Instructions: Apply over primary dressing as directed. Secondary Dressing: Optifoam Non-Adhesive Dressing, 4x4 in (Generic) Every Other Day/15 Days Discharge Instructions: Apply over primary dressing, cut to make foam donut to offload Secured With: Kerlix Roll Sterile, 4.5x3.1 (in/yd) (DME) (Generic) Every Other Day/15 Days Discharge Instructions: Secure with Kerlix as directed. Secured With: 77M Medipore H Soft Cloth Surgical Tape, 2x2 (in/yd) (Generic) Every Other Day/15 Days Discharge Instructions: Secure dressing with tape as directed. Wound #7 - Ankle Wound Laterality:  Right, Anterior Cleanser: Normal Saline (Generic) Every Other Day/15 Days Discharge Instructions: Cleanse the wound with Normal Saline prior to applying a clean dressing using gauze sponges, not tissue or cotton balls. Cleanser: Soap and Water Every Other Day/15 Days Discharge Instructions: May shower and wash wound with dial antibacterial soap and water prior to dressing change. Prim Dressing: KerraCel Ag Gelling Fiber Dressing, 2x2 in (silver alginate) (Generic) Every Other Day/15 Days ary Discharge Instructions: Apply silver alginate to wound bed as instructed Secondary Dressing: Woven Gauze Sponge, Non-Sterile 4x4 in (Generic) Every Other Day/15 Days Discharge Instructions: Apply over primary dressing as directed. Secondary Dressing: ABD Pad, 5x9 (DME) (Generic) Every Other Day/15 Days Discharge Instructions: Apply over primary dressing as directed. Secured With: [employer], 4.5x3.1 (in/yd) (DME) (Generic) Every Other Day/15 Days Discharge Instructions: Secure with Kerlix as directed. Secured With: 77M Medipore H Soft Cloth Surgical Tape, 2x2 (in/yd) (Generic) Every Other Day/15 Days Discharge Instructions: Secure dressing with tape as directed. Electronic Signature(s) Signed: 09/21/2020 5:10:27 PM By: [name] MD Signed: 09/21/2020 5:37:12 PM By: [name] RN, BSN Entered By: [name] on 09/21/2020 16:43:32 -------------------------------------------------------------------------------- Problem List Details Patient Name: Date of Service: [name], [name] 09/21/2020 3:30 PM Medical Record Number: [id number] Patient Account Number: [id number] Date of Birth/Sex: Treating RN: January 31, 1986 (35 y.o. 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 08/10/2020 No Yes Inactive Problems ICD-10 Code Description Active Date Inactive Date L97.518 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ity 10/24/2019 10/24/2019 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, right ankle and foot 10/24/2019 10/24/2019 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o. 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 He comes in for review of a wound over the plantar fifth metatarsal head extending into the lateral part of the foot. He was followed for this previously by his podiatrist Dr. Cornelius Moras. As the patient tells his story he went to see podiatry first for a swelling he developed on the lateral part of his fifth  metatarsal head in May. He states this was "open" by podiatry and the area closed. He was followed up in June and it was again opened callus removed and it closed promptly. There were plans being made for surgery on the fifth metatarsal head in June however his blood sugar was apparently too high for anesthesia. Apparently the area was debrided and opened again in June and it is never closed since. Looking over the records from podiatry I am really not able to follow this. It was clear when he was first seen it was before 5/14 at that point he already had a wound. By 5/17 the ulcer was resolved. I do not see anything about a procedure. On 5/28 noted to have pre-ulcerative moderate keratosis. X-ray noted 1/5 contracted toe and tailor's bunion and metatarsal deformity. On a visit date on 09/28/2018 the dorsal part of the left foot it healed and resolved. There was concern about swelling in his lower extremity he was sent to the ER.. As far as I can tell he was seen in the ER on 7/12 with an ulcer on his left foot. A DVT rule out of the left leg was negative. I do not think I have complete records from podiatry but I am not able to verify the procedures this patient states he had. He states after the last procedure the wound has never closed although I am not able to follow this in the records I have from podiatry. He has not had a recent x-ray The patient has been using Neosporin on the wound. He is wearing a Darco shoe. He is still very active up on his foot working and exercising. Past medical history; type 2 diabetes ketosis-prone, leg swelling with a negative DVT study in July. Non-smoker ABI in our clinic was 0.85 on the left 10/16; substantial wound on the plantar left fifth met head extending laterally almost to the dorsal fifth MTP. We have been using silver alginate we gave him a Darco  forefoot off loader. An x-ray did not show evidence of osteomyelitis did note soft tissue emphysema which I  think was due to gas tracking through an open wound. There is no doubt in my mind he requires an MRI 10/23; MRI not booked until 3 November at the earliest this is largely due to his glucose sensor in the right arm. We have been using silver alginate. There has been an improvement 10/29; I am still not exactly sure when his MRI is booked for. He says it is the third but it is the 10th in epic. This definitely needs to be done. He is running a low-grade fever today but no other symptoms. No real improvement in the 1 02/26/2019 patient presents today for a follow-up visit here in our clinic he is last been seen in the clinic on October 29. Subsequently we were working on getting MRI to evaluate and see what exactly was going on and where we would need to go from the standpoint of whether or not he had osteomyelitis and again what treatments were going be required. Subsequently the patient ended up being admitted to the hospital on 02/07/2019 and was discharged on 02/14/2019. This is a somewhat interesting admission with a discharge diagnosis of pneumonia due to COVID-19 although he was positive for COVID-19 when tested at the urgent care but negative x2 when he was actually in the hospital. With that being said he did have acute respiratory failure with hypoxia and it was noted he also have a left foot ulceration with osteomyelitis. With that being said he did require oxygen for his pneumonia and I level 4 L. He was placed on antivirals and steroids for the COVID-19. He was also transferred to the [place] at one point. Nonetheless he did subsequently discharged home and since being home has done much better in that regard. The CT angiogram did not show any pulmonary embolism. With regard to the osteomyelitis the patient was placed on vancomycin and Zosyn while in the hospital but has been changed to Augmentin at discharge. It was also recommended that he follow- up with wound care and  podiatry. Podiatry however wanted him to see [name] according to the patient prior to them doing anything further. His hemoglobin A1c was 9.9 as noted in the hospital. Have an MRI of the left foot performed while in the hospital on 02/04/2019. 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 There is an overlying plantar open wound noted an abscess tracking back along the lateral aspect of the fifth metatarsal shaft. There is otherwise diffuse cellulitis and mild fasciitis without findings of polymyositis. The patient did have recently pneumonia secondary to COVID-19 I looked in the chart through epic and it does appear that the patient may need to have an additional x-ray just to ensure everything is cleared and that he has no airspace disease prior to putting him into the chamber. 03/05/2019; patient was readmitted to the clinic last week. He was hospitalized twice for a viral upper respiratory tract infection from 11/1 through 11/4 and then 11/5 through 11/12 ultimately this turned out to be Covid pneumonitis. Although he was discharged on oxygen he is not using it. He says he feels fine. He has no exercise limitation no cough no sputum. His O2 sat in our clinic today was 100% on room air. He did manage to have his MRI which showed septic arthritis at the  fifth MTP joint and osteomyelitis involving the fifth metatarsal head and proximal phalanx. He received Vanco and Zosyn in the hospital and then was discharged on 2 weeks of Augmentin. I do not see any relevant cultures. He was supposed to follow-up with infectious disease but I do not see that he has an appointment. 12/8; patient saw Dr. Novella Olive of infectious disease last week. He felt that he had had adequate antibiotic therapy. He did not go to follow-up with Dr. Amalia Hailey of podiatry and I have again talked to him about the pros and cons of this. He does not want to  consider a ray amputation of this time. He is aware of the risks of recurrence, migration etc. He started HBO today and tolerated this well. He can complete the Augmentin that I gave him last week. I have looked over the lab work that Dr. Chana Bode ordered his C-reactive protein was 3.3 and his sedimentation rate was 17. The C-reactive protein is never really been measurably that high in this patient 12/15; not much change in the wound today however he has undermining along the lateral part of the foot again more extensively than last week. He has some rims of epithelialization. We have been using silver alginate. He is undergoing hyperbarics but did not dive today 12/18; in for his obligatory first total contact cast change. Unfortunately there was pus coming from the undermining area around his fifth metatarsal head. This was cultured but will preclude reapplication of a cast. He is seen in conjunction with HBO 12/24; patient had staph lugdunensis in the wound in the undermining area laterally last time. We put him on doxycycline which should have covered this. The wound looks better today. I am going to give him another week of doxycycline before reattempting the total contact cast 12/31; the patient is completing antibiotics. Hemorrhagic debris in the distal part of the wound with some undermining distally. He also had hyper granulation. Extensive debridement with a #5 curette. The infected area that was on the lateral part of the fifth met head is closed over. I do not think he needs any more antibiotics. Patient was seen prior to HBO. Preparations for a total contact cast were made in the cast will be placed post hyperbarics 04/11/19; once again the patient arrives today without complaint. He had been in a cast all week noted that he had heavy drainage this week. This resulted in large raised areas of macerated tissue around the wound 1/14; wound bed looks better slightly smaller. Hydrofera Blue has  been changing himself. He had a heavy drainage last week which caused a lot of maceration around the wound so I took him out of a total contact cast he says the drainage is actually better this week He is seen today in conjunction with HBO 1/21; returns to clinic. He was up in [place] for a day or 2 attending a funeral. He comes back in with the wound larger and with a large area of exposed bone. He had osteomyelitis and septic arthritis of the fifth left metatarsal head while he was in hospital. He received IV antibiotics in the hospital for a prolonged period of time then 3 weeks of Augmentin. Subsequently I gave him 2 weeks of doxycycline for more superficial wound infection. When I saw this last week the wound was smaller the surface of the wound looks satisfactory. 1/28; patient missed hyperbarics today. Bone biopsy I did last time showed Enterococcus faecalis and Staphylococcus lugdunensis . He has a  wide area of exposed bone. We are going to use silver alginate as of today. I had another ethical discussion with the patient. 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 In this situation I think the likelihood of healing this is low. Therefore I have recommended a ray amputation and with the patient's agreement I have referred him to Dr. Doran Durand. The other issue is that his compliance with hyperbarics has been minimal because of his work schedule and given his underlying decision I am going to stop this today READMISSION 10/24/2019 MRI 09/29/2019 left foot IMPRESSION: 1. Apparent skin ulceration inferior and lateral to the 5th metatarsal base with underlying heterogeneous T2 signal and enhancement in the subcutaneous fat. Small peripherally enhancing fluid collections along the plantar and lateral aspects of the 5th metatarsal base suspicious for abscesses. 2. Interval amputation through the mid 5th metatarsal with nonspecific low-level marrow edema and  enhancement. Given the proximity to the adjacent soft tissue inflammatory changes, osteomyelitis cannot be excluded. 3. The additional bones appear unremarkable. MRI 09/29/2019 right foot IMPRESSION: 1. Soft tissue ulceration lateral to the 5th MTP joint. There is low-level T2 hyperintensity within the 4th and 5th metatarsal heads and adjacent proximal phalanges without abnormal T1 signal or cortical destruction. These findings are nonspecific and could be seen with early marrow edema, hyperemia or early osteomyelitis. No evidence of septic joint. 2. Mild tenosynovitis and synovial enhancement associated with the extensor digitorum tendons at the level of the midfoot. 3. Diffuse low-level muscular T2 hyperintensity and enhancement, most consistent with diabetic myopathy. LEFT FOOT BONE Methicillin resistant staphylococcus aureus Staphylococcus lugdunensis MIC MIC CIPROFLOXACIN >=8 RESISTANT Resistant <=0.5 SENSI... Sensitive CLINDAMYCIN <=0.25 SENS... Sensitive >=8 RESISTANT Resistant ERYTHROMYCIN >=8 RESISTANT Resistant >=8 RESISTANT Resistant GENTAMICIN <=0.5 SENSI... Sensitive <=0.5 SENSI... Sensitive Inducible Clindamycin NEGATIVE Sensitive NEGATIVE Sensitive OXACILLIN >=4 RESISTANT Resistant 2 SENSITIVE Sensitive RIFAMPIN <=0.5 SENSI... Sensitive <=0.5 SENSI... Sensitive TETRACYCLINE <=1 SENSITIVE Sensitive <=1 SENSITIVE Sensitive TRIMETH/SULFA <=10 SENSIT Sensitive <=10 SENSIT Sensitive ... Marland Kitchen.. VANCOMYCIN 1 SENSITIVE Sensitive <=0.5 SENSI... Sensitive Right foot bone . Component 3 wk ago Specimen Description BONE Special Requests RIGHT 4 METATARSAL SAMPLE B Gram Stain NO WBC SEEN NO ORGANISMS SEEN Culture RARE METHICILLIN RESISTANT STAPHYLOCOCCUS AUREUS NO ANAEROBES ISOLATED Performed at [facility], [place] [address], [place], [place] [address] Report Status 10/08/2019 FINAL Organism ID, Bacteria METHICILLIN RESISTANT STAPHYLOCOCCUS AUREUS Resulting Agency [facility] Susceptibility Methicillin resistant staphylococcus aureus MIC CIPROFLOXACIN >=8 RESISTANT Resistant CLINDAMYCIN <=0.25 SENS... Sensitive ERYTHROMYCIN >=8 RESISTANT Resistant GENTAMICIN <=0.5 SENSI... Sensitive Inducible Clindamycin NEGATIVE Sensitive OXACILLIN >=4 RESISTANT Resistant RIFAMPIN <=0.5 SENSI... Sensitive TETRACYCLINE <=1 SENSITIVE Sensitive TRIMETH/SULFA <=10 SENSIT Sensitive ... VANCOMYCIN 1 SENSITIVE Sensitive This is a patient we had in clinic earlier this year with a wound over his left fifth metatarsal head. 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 Doran Durand on 05/16/2019. He seems to have developed problems with wounds on his bilateral feet in June 2021 although he may have had problems earlier than this. He was in an urgent care with a right foot ulcer on 09/26/2019 and given a course of doxycycline. This was apparently after having trouble getting into see orthopedics. He was seen by podiatry on 09/28/2019 noted to have  bilateral lower extremity ulcers including the left lateral fifth metatarsal base and the right subfifth met head. It was noted that had purulent drainage at that time. He required hospitalization from 6/20 through 7/2. This was because of worsening right foot wounds. He underwent bilateral operative incision and drainage and bone biopsies bilaterally. Culture results are listed above. He has been referred back to clinic by Dr. Jacqualyn Posey of podiatry. He is also followed by Dr. Megan Salon who saw him yesterday. He was discharged from hospital on Zyvox Flagyl and Levaquin and yesterday changed to doxycycline Flagyl and Levaquin. His inflammatory markers on 6/26 showed a  sedimentation rate of 129 and a C-reactive protein of 5. This is improved to 14 and 1.3 respectively. This would indicate improvement. ABIs in our clinic today were 1.23 on the right and 1.20 on the left 11/01/2019 on evaluation today patient appears to be doing fairly well in regard to the wounds on his feet at this point. Fortunately there is no signs of active infection at this time. No fevers, chills, nausea, vomiting, or diarrhea. He currently is seeing infectious disease and still under their care at this point. Subsequently he also has both wounds which she has not been using collagen on as he did not receive that in his packaging he did not call us and let us know that. Apparently that just was missed on the order. Nonetheless we will get that straightened out today. 8/9-Patient returns for bilateral foot wounds, using Prisma with hydrogel moistened dressings, and the wounds appear stable. Patient using surgical shoes, avoiding much pressure or weightbearing as much as possible 8/16; patient has bilateral foot wounds. 1 on the right lateral foot proximally the other is on the left mid lateral foot. Both required debridement of callus and thick skin around the wounds. We have been using silver collagen 8/27; patient has bilateral lateral foot wounds. The area on the left substantially surrounded by callus and dry skin. This was removed from the wound edge. The underlying wound is small. The area on the right measured somewhat smaller today. 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 Unfortunately I did not update his antibiotics during today's visit. 9/10 I reviewed Dr. Hale Bogus last notes he felt he had completed antibiotics his inflammatory markers were reasonably well controlled. He has a small wound on the lateral left foot and a tiny area on the right which is just above closed. He is using Hydrofera Blue with border foam  he has bilateral surgical shoes 9/24; 2 week f/u. doing well. right foot is closed. left foot still undermined. 10/14; right foot remains closed at the fifth met head. The area over the base of the left fifth metatarsal has a small open area but considerable undermining towards the plantar foot. Thick callus skin around this suggests an adequate pressure relief. We have talked about this. He says he is going to go back into his cam boot. I suggested a total contact cast he did not seem enamored with this suggestion 10/26; left foot base of the fifth metatarsal. Same condition as last time. He has skin over the area with an open wound however the skin is not adherent. He went to see Dr. Earleen Newport who did an x-ray and culture of his foot I have not reviewed the x-ray but the patient was not told anything. He is on doxycycline 11/11; since the patient was last here he was in the emergency room  on 10/30 he was concerned about swelling in the left foot. They did not do any cultures or x-rays. They changed his antibiotics to cephalexin. Previous culture showed group B strep. The cephalexin is appropriate as doxycycline has less than predictable coverage. Arrives in clinic today with swelling over this area under the wound. He also has a new wound on the right fifth metatarsal head 11/18; the patient has a difficult wound on the lateral aspect of the left fifth metatarsal head. The wound was almost ballotable last week I opened it slightly expecting to see purulence however there was just bleeding. I cultured this this was negative. X-ray unchanged. We are trying to get an MRI but I am not sure were going to be able to get this through his insurance. He also has an area on the right lateral fifth metatarsal head this looks healthier 12/3; the patient finally got our MRI. Surprisingly this did not show osteomyelitis. I did show the soft tissue ulceration at the lateral plantar aspect of the fifth metatarsal base  with a tiny residual 6 mm abscess overlying the superficial fascia I have tried to culture this area I have not been able to get this to grow anything. Nevertheless the protruding tissue looks aggravated. I suspect we should try to treat the underlying "abscess with broad-spectrum antibiotics. I am going to start him on Levaquin and Flagyl. He has much less edema in his legs and I am going to continue to wrap his legs and see him weekly 12/10. I started Levaquin and Flagyl on him last week. He just picked up the Flagyl apparently there was some delay. The worry is the wound on the left fifth metatarsal base which is substantial and worsening. His foot looks like he inverts at the ankle making this a weightbearing surface. Certainly no improvement in fact I think the measurements of this are somewhat worse. We have been using 12/17; he apparently just got the Levaquin yesterday this is 2 weeks after the fact. He has completed the Flagyl. The area over the left fifth metatarsal base still has protruding granulation tissue although it does not look quite as bad as it did some weeks ago. He has severe bilateral lymphedema although we have not been treating him for wounds on his legs this is definitely going to require compression. There was so much edema in the left I did not wish to put him in a total contact cast today. I am going to increase his compression from 3-4 layer. The area on the right lateral fifth met head actually look quite good and superficial. 12/23; patient arrived with callus on the right fifth met head and the substantial hyper granulated callused wound on the base of his fifth metatarsal. He says he is completing his Levaquin in 2 days but I do not think that adds up with what I gave him but I will have to double check this. We are using Hydrofera Blue on both areas. My plan is to put the left leg in a cast the week after New Year's 04/06/2020; patient's wounds about the same. Right  lateral fifth metatarsal head and left lateral foot over the base of the fifth metatarsal. There is undermining on the left lateral foot which I removed before application of total contact cast continuing with Hydrofera Blue new. Patient tells me he was seen by endocrinology today lab work was done [Dr. Kerr]. Also wondering whether he was referred to cardiology. I went over some lab work  from previously does not have chronic renal failure certainly not nephrotic range proteinuria he does have very poorly controlled diabetes but this is not his most updated lab work. Hemoglobin A1c has been over 11 1/10; the patient had a considerable amount of leakage towards mid part of his left foot with macerated skin however the wound surface looks better the area on the right lateral fifth met head is better as well. I am going to change the dressing on the left foot under the total contact cast to silver alginate, continue with Hydrofera Blue on the right. 1/20; patient was in the total contact cast for 10 days. Considerable amount of drainage although the skin around the wound does not look too bad on the left foot. The area on the right fifth metatarsal head is closed. Our nursing staff reports large amount of drainage out of the left lateral foot wound 1/25; continues with copious amounts of drainage described by our intake staff. PCR culture I did last week showed E. coli and Enterococcus faecalis and low quantities. Multiple resistance genes documented including extended spectrum beta lactamase, MRSA, MRSE, quinolone, tetracycline. The wound is not quite as good this week as it was 5 days ago but about the same size 2/3; continues with copious amounts of malodorous drainage per our intake nurse. The PCR culture I did 2 weeks ago showed E. coli and low quantities of Enterococcus. There were multiple resistance genes detected. I put Neosporin on him last week although this does not seem to have helped. The  wound is slightly deeper today. Offloading continues to be an issue here although with the amount of drainage she has a total contact cast is just not going to work 2/10; moderate amount of drainage. Patient reports he cannot get his stocking on over the dressing. I told him we have to do that the nurse gave him suggestions on how to make this work. The wound is on the bottom and lateral part of his left foot. Is cultured predominantly grew low amounts of Enterococcus, E. coli and anaerobes. There were multiple resistance genes detected including extended spectrum beta lactamase, quinolone, tetracycline. I could not think of an easy oral combination to address this so for now I am going to do topical antibiotics provided by [facility] I think the main agents here are vancomycin and an aminoglycoside. We have to be able to give him access to the wounds to get the topical antibiotic on 2/17; moderate amount of drainage this is unchanged. He has his Keystone topical antibiotic against the deep tissue culture organisms. He has been using this and changing the dressing daily. Silver alginate on the wound surface. 2/24; using Keystone antibiotic with silver alginate on the top. He had too much drainage for a total contact cast at one point although I think that is improving and I think in the next week or 2 it might be possible to replace a total contact cast I did not do this today. In general the wound surface looks healthy however he continues to have thick rims of skin and subcutaneous tissue around the wide area of the circumference which I debrided 06/04/2020 upon evaluation today patient appears to be doing well in regard to his wound. I do feel like he is showing signs of improvement. There is little bit of callus and dead tissue around the edges of the wound as well as what appears to be a little bit of a sinus tract that is off to the  side laterally I would perform debridement to clear that away  today. 3/17; left lateral foot. The wound looks about the same as I remember. Not much depth surface looks healthy. No evidence of infection 3/25; left lateral foot. Wound surface looks about the same. Separating epithelium from the circumference. There really is no evidence of infection here however not making progress by my view 3/29; left lateral foot. Surface of the wound again looks reasonably healthy still thick skin and subcutaneous tissue around the wound margins. There is no evidence of infection. One of the concerns being brought up by the nurses has again the amount of drainage vis--vis continued use of a total contact cast 4/5; left lateral foot at roughly the base of the fifth metatarsal. Nice healthy looking granulated tissue with rims of epithelialization. The overall wound measurements are not any better but the tissue looks healthy. The only concern is the amount of drainage although he has no surrounding maceration with what we have been doing recently to absorb fluid and protect his skin. He also has lymphedema. 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 He states that the kerlix/coban under the TCC rolled down and could not get it back up. He has been using an offloading scooter and has somehow hurt his right foot using this device. This happened last week. He states that the side of his right foot developed a blister and opened. The top of his foot also has a few small open wounds he thinks is due to his socks rubbing in his shoes. He has not been using any dressings to the wound. He denies purulent drainage, fever/chills or erythema to the wounds. 4/22; patient presents for 1 week follow-up. He developed new wounds to the right foot  that were evaluated at last clinic visit. He continues to have a total contact cast to the left leg and he reports no issues. He has been using silver collagen to the right foot wounds with no issues. He denies purulent drainage, fever/chills or erythema to the right foot wounds. He has no complaints today 4/25; patient presents for 1 week follow-up. He has a total contact cast of the left leg and reports no issues. He has been using silver alginate to the right foot wound. He denies purulent drainage, fever/chills or erythema to the right foot wounds. 5/2 patient presents for 1 week follow-up. T contact cast on the left. The wound which is on the base of the plantar foot at the base of the fifth metatarsal otal actually looks quite good and dimensions continue to gradually contract. HOWEVER the area on the right lateral fifth metatarsal head is much larger than what I remember from 2 weeks ago. Once more is he has significant levels of hypergranulation. Noteworthy that he had this same hyper granulated response on his wound on the left foot at one point in time. So much so that he I thought there was an underlying fluid collection. Based on this I think this just needs debridement. 5/9; the wound on the left actually continues to be gradually smaller with a healthy surface. Slight amount of drainage and maceration of the skin around but not too bad. However he has a large wound over the right  fifth metatarsal head very much in the same configuration as his left foot wound was initially. I used silver nitrate to address the hyper granulated tissue no mechanical debridement 5/16; area on the left foot did not look as healthy this week deeper thick surrounding macerated skin and subcutaneous tissue. oo The area on the right foot fifth met head was about the same oo The area on the right ankle that we identified last week is completely broken down into an open wound presumably a stocking rubbing  issue 5/23; patient has been using a total contact cast to the left side. He has been using silver alginate underneath. He has also been using silver alginate to the right foot wounds. He has no complaints today. He denies any signs of infection. 5/31; the left-sided wound looks some better measure smaller surface granulation looks better. We have been using silver alginate under the total contact cast oo The large area on his right fifth met head and right dorsal foot look about the same still using silver alginate 6/6; neither side is good as I was hoping although the surface area dimensions are better. A lot of maceration on his left and right foot around the wound edge. Area on the dorsal right foot looks better. He says he was traveling. I am not sure what does the amount of maceration around the plantar wounds may be drainage issues 6/13; in general the wound surfaces look quite good on both sides. Macerated skin and raised edges around the wound required debridement although in general especially on the left the surface area seems improved. oo The area on the right dorsal ankle is about the same I thought this would not be such a problem to close 6/20; not much change in either wound although the one on the right looks a little better. Both wounds have thick macerated edges to the skin requiring debridements. We have been using silver alginate. The area on the dorsal right ankle is still open I thought this would be closed. Objective Constitutional Sitting or standing Blood Pressure is within target range for patient.. Pulse regular and within target range for patient.Marland Kitchen Respirations regular, non-labored and within target range.. Temperature is normal and within the target range for the patient.Marland Kitchen Appears in no distress. Vitals Time Taken: 3:50 PM, Height: 77 in, Weight: 280 lbs, BMI: 33.2, Temperature: 99.2 F, Pulse: 104 bpm, Respiratory Rate: 18 breaths/min, Blood Pressure: 120/67 mmHg,  Capillary Blood Glucose: 137 mg/dl. Cardiovascular Bilateral lymphedema marginally controlled. General Notes: Wound exam; left lateral a lot of maceration thick raised skin around the wound. Actually the area on the right looks somewhat smaller but with still the thick macerated skin tissue around the wound edge. I used a #5 curette to debride both wounds hemostasis with silver nitrate and direct pressure. I am trying to get a better wound edge. oo The left lateral ankle again is about the same Integumentary (Hair, Skin) Wound #3 status is Open. Original cause of wound was Trauma. The date acquired was: 10/02/2019. The wound has been in treatment 47 weeks. The wound is located on the Left,Lateral Foot. The wound measures 2.4cm length x 1.8cm width x 0.3cm depth; 3.393cm^2 area and 1.018cm^3 volume. There is Fat Layer (Subcutaneous Tissue) exposed. There is no tunneling noted, however, there is undermining starting at 7:00 and ending at 2:00 with a maximum distance of 0.8cm. There is a large amount of serosanguineous drainage noted. The wound margin is thickened. There is large (67-100%) red granulation  within the wound bed. There is no necrotic tissue within the wound bed. General Notes: maceration, callous noted Wound #5 status is Open. Original cause of wound was Gradually Appeared. The date acquired was: 07/07/2020. The wound has been in treatment 9 weeks. The wound is located on the Right,Lateral Metatarsal head fifth. The wound measures 2.4cm length x 2.7cm width x 0.4cm depth; 5.089cm^2 area and 2.036cm^3 volume. There is Fat Layer (Subcutaneous Tissue) exposed. There is no tunneling or undermining noted. There is a medium amount of serosanguineous drainage noted. The wound margin is distinct with the outline attached to the wound base. There is large (67-100%) red granulation within the wound bed. There is no necrotic tissue within the wound bed. General Notes: Calloused periwound Wound #7  status is Open. Original cause of wound was Gradually Appeared. The date acquired was: 08/17/2020. The wound has been in treatment 5 weeks. The wound is located on the Right,Anterior Ankle. The wound measures 1.2cm length x 1cm width x 0.1cm depth; 0.942cm^2 area and 0.094cm^3 volume. There is Fat Layer (Subcutaneous Tissue) exposed. There is no tunneling or undermining noted. There is a medium amount of serosanguineous drainage noted. The wound margin is distinct with the outline attached to the wound base. There is large (67-100%) red granulation within the wound bed. There is a small (1-33%) amount of necrotic tissue within the wound bed including Adherent Slough. 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 There was a Excisional Skin/Subcutaneous Tissue Debridement with a total area of 4.32 sq cm performed by [name], MD. With the following instrument(s): Curette to remove Viable and Non-Viable tissue/material. Material removed includes Callus and Subcutaneous Tissue and. No specimens were taken. A time out was conducted at 16:30, prior to the start of the procedure. A Moderate amount of bleeding was controlled with Silver Nitrate. The procedure was tolerated well with a pain level of 0 throughout and a pain level of 0 following the procedure. Post Debridement Measurements: 2.4cm length x 4.8cm width x 0.3cm depth; 2.714cm^3 volume. Character of Wound/Ulcer Post Debridement is improved. Severity of Tissue Post Debridement  is: Fat layer exposed. Post procedure Diagnosis Wound #3: Same as Pre-Procedure Pre-procedure diagnosis of Wound #3 is a Diabetic Wound/Ulcer of the Lower Extremity located on the Left,Lateral Foot . There was a T Contact Cast otal Procedure by [name], MD. Post procedure Diagnosis Wound #3: Same as Pre-Procedure Wound #5 Pre-procedure diagnosis of Wound #5 is a Diabetic Wound/Ulcer of the Lower Extremity located on the Right,Lateral Metatarsal head fifth .Severity of Tissue Pre Debridement is: Fat layer exposed. There was a Excisional Skin/Subcutaneous Tissue Debridement with a total area of 6.48 sq cm performed by [name], MD. With the following instrument(s): Curette to remove Viable and Non-Viable tissue/material. Material removed includes Callus and Subcutaneous Tissue and. No specimens were taken. A time out was conducted at 16:30, prior to the start of the procedure. A Moderate amount of bleeding was controlled with Silver Nitrate. The procedure was tolerated well with a pain level of 0 throughout and a pain level of  0 following the procedure. Post Debridement Measurements: 2.4cm length x 2.7cm width x 0.4cm depth; 2.036cm^3 volume. Character of Wound/Ulcer Post Debridement is improved. Severity of Tissue Post Debridement is: Fat layer exposed. Post procedure Diagnosis Wound #5: Same as Pre-Procedure Plan Follow-up Appointments: Return Appointment in 1 week. - ***60 minutes for cast*** - with Dr. Arcola Jansky Shower/ Hygiene: May shower with protection but do not get wound dressing(s) wet. - use a cast protector to left leg. Edema Control - Lymphedema / SCD / Other: Elevate legs to the level of the heart or above for 30 minutes daily and/or when sitting, a frequency of: - throughout the day Avoid standing for long periods of time. Exercise regularly Moisturize legs daily. - right leg every night before bed. Compression stocking or Garment 20-30 mm/Hg pressure  to: - Apply to right leg in the morning and remove at night. Off-Loading: T Contact Cast to Left Lower Extremity otal Open toe surgical shoe to: - right foot WOUND #3: - Foot Wound Laterality: Left, Lateral Cleanser: Soap and Water 1 x Per Week/30 Days Discharge Instructions: May shower and wash wound with dial antibacterial soap and water prior to dressing change. Peri-Wound Care: Zinc Oxide Ointment 30g tube 1 x Per Week/30 Days Discharge Instructions: Apply Zinc Oxide to periwound with each dressing change as needed. Prim Dressing: KerraCel Ag Gelling Fiber Dressing, 2x2 in (silver alginate) 1 x Per Week/30 Days ary Discharge Instructions: Apply silver alginate to wound bed as instructed Secondary Dressing: Woven Gauze Sponge, Non-Sterile 4x4 in 1 x Per Week/30 Days Discharge Instructions: Apply over primary dressing as directed. Secondary Dressing: ABD Pad, 8x10 1 x Per Week/30 Days Discharge Instructions: T down before applying kerlix. Apply over primary dressing as directed. ape Secondary Dressing: Zetuvit Plus 4x8 in 1 x Per Week/30 Days Discharge Instructions: Apply over primary dressing as directed. Secured With: [employer], 4.5x3.1 (in/yd) 1 x Per Week/30 Days Discharge Instructions: Secure with Kerlix as directed. Com pression Wrap: Kerlix Roll 4.5x3.1 (in/yd) 1 x Per Week/30 Days Discharge Instructions: Apply Kerlix and Coban compression under TCC. Com pression Wrap: Coban Self-Adherent Wrap 4x5 (in/yd) 1 x Per Week/30 Days Discharge Instructions: Apply over Kerlix as directed. WOUND #5: - Metatarsal head fifth Wound Laterality: Right, Lateral Cleanser: Normal Saline (Generic) Every Other Day/15 Days Discharge Instructions: Cleanse the wound with Normal Saline prior to applying a clean dressing using gauze sponges, not tissue or cotton balls. Cleanser: Soap and Water Every Other Day/15 Days Discharge Instructions: May shower and wash wound with dial antibacterial  soap and water prior to dressing change. Prim Dressing: KerraCel Ag Gelling Fiber Dressing, 2x2 in (silver alginate) (Generic) Every Other Day/15 Days ary Discharge Instructions: Apply silver alginate to wound bed as instructed Secondary Dressing: ABD Pad, 5x9 (DME) (Generic) Every Other Day/15 Days Discharge Instructions: Apply over primary dressing as directed. Secondary Dressing: Optifoam Non-Adhesive Dressing, 4x4 in (Generic) Every Other Day/15 Days Discharge Instructions: Apply over primary dressing, cut to make foam donut to offload Secured With: Kerlix Roll Sterile, 4.5x3.1 (in/yd) (DME) (Generic) Every Other Day/15 Days Discharge Instructions: Secure with Kerlix as directed. Secured With: 65M Medipore H Soft Cloth Surgical T ape, 2x2 (in/yd) (Generic) Every Other Day/15 Days Discharge Instructions: Secure dressing with tape as directed. WOUND #7: - Ankle Wound Laterality: Right, Anterior Cleanser: Normal Saline (Generic) Every Other Day/15 Days Discharge Instructions: Cleanse the wound with Normal Saline prior to applying a clean dressing using gauze sponges, not tissue or cotton balls. Cleanser:  Soap and Water Every Other Day/15 Days Discharge Instructions: May shower and wash wound with dial antibacterial soap and water prior to dressing change. Prim Dressing: KerraCel Ag Gelling Fiber Dressing, 2x2 in (silver alginate) (Generic) Every Other Day/15 Days ary Discharge Instructions: Apply silver alginate to wound bed as instructed Secondary Dressing: Woven Gauze Sponge, Non-Sterile 4x4 in (Generic) Every Other Day/15 Days Discharge Instructions: Apply over primary dressing as directed. Secondary Dressing: ABD Pad, 5x9 (DME) (Generic) Every Other Day/15 Days Discharge Instructions: Apply over primary dressing as directed. Secured With: [employer], 4.5x3.1 (in/yd) (DME) (Generic) Every Other Day/15 Days Discharge Instructions: Secure with Kerlix as directed. Secured With: 61M  Medipore H Soft Cloth Surgical T ape, 2x2 (in/yd) (Generic) Every Other Day/15 Days Discharge Instructions: Secure dressing with tape as directed. #1 Silver alginate to continue 2. Still the total contact cast on the left 3. Somewhat frustrating lack of progress here. I have asked him to keep the pressure off his feet even with the cast on 4. No evidence of infection. He does not have an arterial issue Electronic Signature(s) Signed: 09/21/2020 5:10:27 PM By: [name] MD Entered By: [name] on 09/21/2020 16:49:55 -------------------------------------------------------------------------------- Total Contact Cast Details Patient Name: Date of Service: [name] 09/21/2020 3:30 PM Medical Record Number: [id number] Patient Account Number: [id number] Date of Birth/Sex: Treating RN: [birth year] (35 y.o. [name] Primary Care Provider: [name] Other Clinician: Referring Provider: Treating Provider/Extender: [name] in Treatment: 47 T Contact Cast Applied for Wound Assessment: otal Wound #3 Left,Lateral Foot Performed By: Physician [name], MD Post Procedure Diagnosis Same as Pre-procedure Electronic Signature(s) Signed: 09/21/2020 5:10:27 PM By: [name] MD Entered By: [name] on 09/21/2020 16:47:06 -------------------------------------------------------------------------------- SuperBill Details Patient Name: Date of Service: [name], [name] 09/21/2020 Medical Record Number: [id number] Patient Account Number: [id number] Date of Birth/Sex: Treating RN: 11-30-1985 (35 y.o. 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 Facility Procedures CPT4 Code: [id number] Description: 85027 - DEB SUBQ TISSUE 20 SQ CM/< ICD-10 Diagnosis Description L97.528 Non-pressure chronic ulcer of other part of left foot with other specified seve L97.518 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

## 2020-09-21 NOTE — Progress Notes (Addendum)
[name] ([id number]) Visit Report for 09/21/2020 Arrival Information Details Patient Name: Date of Service: [name] 09/21/2020 3:30 PM Medical Record Number: [id number] Patient Account Number: [id number] Date of Birth/Sex: Treating RN: 23-Jul-1985 (35 y.o. [name] Primary Care Joselyne Spake: [name] Other Clinician: Referring Velmer Broadfoot: Treating Keilah Lemire/Extender: [name] in Treatment: 59 Visit Information History Since Last Visit Added or deleted any medications: No Patient Arrived: Ambulatory Any new allergies or adverse reactions: No Arrival Time: 15:44 Had a fall or experienced change in No Accompanied By: self activities of daily living that may affect Transfer Assistance: None risk of falls: Patient Identification Verified: Yes Signs or symptoms of abuse/neglect since last visito No Secondary Verification Process Completed: Yes Hospitalized since last visit: No Patient Requires Transmission-Based Precautions: No Implantable device outside of the clinic excluding No Patient Has Alerts: No cellular tissue based products placed in the center since last visit: Has Dressing in Place as Prescribed: Yes Has Footwear/Offloading in Place as Prescribed: Yes Left: T Contact Cast otal Pain Present Now: No Electronic Signature(s) Signed: 09/21/2020 6:32:01 PM By: Baruch Gouty RN, BSN Entered By: Baruch Gouty on 09/21/2020 15:48:00 -------------------------------------------------------------------------------- Lower Extremity Assessment Details Patient Name: Date of Service: [name], [name] 09/21/2020 3:30 PM Medical Record Number: [id number] Patient Account Number: [id number] Date of Birth/Sex: Treating RN: 1985/06/19 (35 y.o. [name] Primary Care Alondria Mousseau: [name] Other Clinician: Referring Kaisen Ackers: Treating Elery Cadenhead/Extender: [name] in Treatment: 47 Edema  Assessment Assessed: [name]: Yes] [name]: Yes] Edema: [Left: Yes] [Right: Yes] Calf Left: Right: Point of Measurement: 48 cm From Medial Instep 50.5 cm 48 cm Ankle Left: Right: Point of Measurement: 10 cm From Medial Instep 33 cm 31 cm Vascular Assessment Pulses: Dorsalis Pedis Palpable: [Left:Yes] [Right:Yes] Electronic Signature(s) Signed: 09/21/2020 6:31:05 PM By: [name] Entered By: [name] on 09/21/2020 16:07:26 -------------------------------------------------------------------------------- Multi Wound Chart Details Patient Name: Date of Service: [name], [name] 09/21/2020 3:30 PM Medical Record Number: [id number] Patient Account Number: [id number] Date of Birth/Sex: Treating RN: [birth year] (35 y.o. [name] Primary Care Kahlil Cowans: [name] Other Clinician: Referring Chantelle Verdi: Treating Katira Dumais/Extender: [name] in Treatment: 47 Vital Signs Height(in): 77 Capillary Blood Glucose(mg/dl): 137 Weight(lbs): 280 Pulse(bpm): 104 Body Mass Index(BMI): 87 Blood Pressure(mmHg): 120/67 Temperature(F): 99.2 Respiratory Rate(breaths/min): 18 Photos: [3:No Photos Left, Lateral Foot] [5:No Photos Right, Lateral Metatarsal head fifth Right, Anterior Ankle] [7:No Photos] Wound Location: [3:Trauma] [5:Gradually Appeared] [7:Gradually Appeared] Wounding Event: [3:Diabetic Wound/Ulcer of the Lower] [5:Diabetic Wound/Ulcer of the Lower] [7:Diabetic Wound/Ulcer of the Lower] Primary Etiology: [3:Extremity Type II Diabetes] [5:Extremity Type II Diabetes] [7:Extremity Type II Diabetes] Comorbid History: [3:10/02/2019] [5:07/07/2020] [7:08/17/2020] Date Acquired: [3:47] [5:9] [7:5] Weeks of Treatment: [3:Open] [5:Open] [7:Open] Wound Status: [3:2.4x1.8x0.3] [5:2.4x2.7x0.4] [7:1.2x1x0.1] Measurements L x W x D (cm) [3:3.393] [5:5.089] [7:0.942] A (cm) : rea [3:1.018] [5:2.036] [7:0.094] Volume (cm) : [3:-105.80%] [5:42.10%]  [7:58.50%] % Reduction in A [3:rea: -517.00%] [5:53.70%] [7:58.60%] % Reduction in Volume: [3:7] Starting Position 1 (o'clock): [3:2] Ending Position 1 (o'clock): [3:0.8] Maximum Distance 1 (cm): [3:Yes] [5:No] [7:No] Undermining: [3:Grade 2] [5:Grade 2] [7:Grade 2] Classification: [3:Large] [5:Medium] [7:Medium] Exudate A mount: [3:Serosanguineous] [5:Serosanguineous] [7:Serosanguineous] Exudate Type: [3:red, brown] [5:red, brown] [7:red, brown] Exudate Color: [3:Thickened] [5:Distinct, outline attached] [7:Distinct, outline attached] Wound Margin: [3:Large (67-100%)] [5:Large (67-100%)] [7:Large (67-100%)] Granulation A mount: [3:Red] [5:Red] [7:Red] Granulation Quality: [3:None Present (0%)] [5:None Present (0%)] [7:Small (1-33%)] Necrotic A mount: [3:Fat Layer (Subcutaneous  Tissue): Yes Fat Layer (Subcutaneous Tissue): Yes Fat Layer (Subcutaneous Tissue): Yes] Exposed Structures: [3:Fascia: No Tendon: No Muscle: No Joint: No Bone: No Small (1-33%)] [5:Fascia: No Tendon: No Muscle: No Joint: No Bone: No Small (1-33%)] [7:Fascia: No Tendon: No Muscle: No Joint: No Bone: No Medium (34-66%)] Epithelialization: [3:Debridement - Excisional] [5:Debridement - Excisional] [7:N/A] Debridement: Pre-procedure Verification/Time Out 16:30 [5:16:30] [7:N/A] Taken: [3:Callus, Subcutaneous] [5:Callus, Subcutaneous] [7:N/A] Tissue Debrided: [3:Skin/Subcutaneous Tissue] [5:Skin/Subcutaneous Tissue] [7:N/A] Level: [3:4.32] [5:6.48] [7:N/A] Debridement A (sq cm): [3:rea Curette] [5:Curette] [7:N/A] Instrument: [3:Moderate] [5:Moderate] [7:N/A] Bleeding: [3:Silver Nitrate] [5:Silver Nitrate] [7:N/A] Hemostasis A chieved: [3:0] [5:0] [7:N/A] Procedural Pain: [3:0] [5:0] [7:N/A] Post Procedural Pain: [3:Procedure was tolerated well] [5:Procedure was tolerated well] [7:N/A] Debridement Treatment Response: [3:2.4x4.8x0.3] [5:2.4x2.7x0.4] [7:N/A] Post Debridement Measurements L x W x D (cm) [3:2.714]  [5:2.036] [7:N/A] Post Debridement Volume: (cm) [3:maceration, callous noted] [5:Calloused periwound] [7:N/A] Assessment Notes: [3:Debridement] [5:Debridement] [7:N/A] Procedures Performed: [3:T Contact Cast otal] Treatment Notes Electronic Signature(s) Signed: 09/21/2020 5:10:27 PM By: [name] MD Signed: 09/21/2020 5:37:12 PM By: [name] RN, BSN Entered By: [name] on 09/21/2020 16:46:34 -------------------------------------------------------------------------------- Multi-Disciplinary Care Plan Details Patient Name: Date of Service: [name], [name] 09/21/2020 3:30 PM Medical Record Number: [id number] Patient Account Number: [id number] Date of Birth/Sex: Treating RN: 1985/06/04 (35 y.o. [name] Primary Care Ireland Virrueta: [name] Other Clinician: Referring Vonte Rossin: Treating Jinger Middlesworth/Extender: [name] in Treatment: 43 Multidisciplinary Care Plan reviewed with physician Active Inactive Nutrition Nursing Diagnoses: Imbalanced nutrition Potential for alteratiion in Nutrition/Potential for imbalanced nutrition Goals: Patient/caregiver agrees to and verbalizes understanding of need to use nutritional supplements and/or vitamins as prescribed Date Initiated: 10/24/2019 Date Inactivated: 04/06/2020 Target Resolution Date: 04/03/2020 Goal Status: Met Patient/caregiver will maintain therapeutic glucose control Date Initiated: 10/24/2019 Target Resolution Date: 10/16/2020 Goal Status: Active Interventions: Assess HgA1c results as ordered upon admission and as needed Assess patient nutrition upon admission and as needed per policy Provide education on elevated blood sugars and impact on wound healing Provide education on nutrition Treatment Activities: Education provided on Nutrition : 07/07/2020 Notes: Wound/Skin Impairment Nursing Diagnoses: Impaired tissue integrity Knowledge deficit related to ulceration/compromised  skin integrity Goals: Patient/caregiver will verbalize understanding of skin care regimen Date Initiated: 10/24/2019 Target Resolution Date: 10/16/2020 Goal Status: Active Ulcer/skin breakdown will have a volume reduction of 30% by week 4 Date Initiated: 10/24/2019 Date Inactivated: 01/16/2020 Target Resolution Date: 01/10/2020 Unmet Reason: no change in Goal Status: Unmet measurements. Interventions: Assess patient/caregiver ability to obtain necessary supplies Assess patient/caregiver ability to perform ulcer/skin care regimen upon admission and as needed Assess ulceration(s) every visit Provide education on ulcer and skin care Notes: Electronic Signature(s) Signed: 09/21/2020 5:37:12 PM By: [name] RN, BSN Entered By: [name] on 09/21/2020 16:43:47 -------------------------------------------------------------------------------- Pain Assessment Details Patient Name: Date of Service: [name], [name] 09/21/2020 3:30 PM Medical Record Number: [id number] Patient Account Number: [id number] Date of Birth/Sex: Treating RN: [birth year] (35 y.o. [name] Primary Care Skylen Spiering: [name] Other Clinician: Referring Ivee Poellnitz: Treating Misty Foutz/Extender: [name] in Treatment: 47 Active Problems Location of Pain Severity and Description of Pain Patient Has Paino No Site Locations Pain Management and Medication Current Pain Management: Electronic Signature(s) Signed: 09/21/2020 6:31:05 PM By: [name] Entered By: [name] on 09/21/2020 15:40:08 -------------------------------------------------------------------------------- Patient/Caregiver Education Details Patient Name: Date of Service: [name] 6/20/2022andnbsp3:30 PM Medical Record Number: [id number] Patient Account Number: [id number] Date of Birth/Gender: Treating RN: October 15, 1985 (34  y.o. Jerilynn Mages) [name] Primary Care Physician: [name] Other  Clinician: Referring Physician: Treating Physician/Extender: [name] in Treatment: 65 Education Assessment Education Provided To: Patient Education Topics Provided Wound/Skin Impairment: Methods: Explain/Verbal Responses: State content correctly [employer]) Signed: 09/21/2020 5:37:12 PM By: [name] RN, BSN Entered By: [name] on 09/21/2020 16:44:00 -------------------------------------------------------------------------------- Wound Assessment Details Patient Name: Date of Service: [name], [name] 09/21/2020 3:30 PM Medical Record Number: [id number] Patient Account Number: [id number] Date of Birth/Sex: Treating RN: 1985-11-13 (35 y.o. [name] Primary Care Cayne Yom: [name] Other Clinician: Referring Yamilett Anastos: Treating Adalene Gulotta/Extender: [name] in Treatment: 47 Wound Status Wound Number: 3 Primary Etiology: Diabetic Wound/Ulcer of the Lower Extremity Wound Location: Left, Lateral Foot Wound Status: Open Wounding Event: Trauma Comorbid History: Type II Diabetes Date Acquired: 10/02/2019 Weeks Of Treatment: 47 Clustered Wound: No Photos Wound Measurements Length: (cm) 2.4 Width: (cm) 1.8 Depth: (cm) 0.3 Area: (cm) 3.393 Volume: (cm) 1.018 % Reduction in Area: -105.8% % Reduction in Volume: -517% Epithelialization: Small (1-33%) Tunneling: No Undermining: Yes Starting Position (o'clock): 7 Ending Position (o'clock): 2 Maximum Distance: (cm) 0.8 Wound Description Classification: Grade 2 Wound Margin: Thickened Exudate Amount: Large Exudate Type: Serosanguineous Exudate Color: red, brown Foul Odor After Cleansing: No Slough/Fibrino No Wound Bed Granulation Amount: Large (67-100%) Exposed Structure Granulation Quality: Red Fascia Exposed: No Necrotic Amount: None Present (0%) Fat Layer (Subcutaneous Tissue) Exposed: Yes Tendon Exposed: No Muscle  Exposed: No Joint Exposed: No Bone Exposed: No Assessment Notes maceration, callous noted Electronic Signature(s) Signed: 09/22/2020 10:33:03 AM By: Sandre Kitty Signed: 09/22/2020 4:28:34 PM By: [name] Previous Signature: 09/21/2020 6:31:05 PM Version By: [name] Entered By: Sandre Kitty on 09/22/2020 10:18:37 -------------------------------------------------------------------------------- Wound Assessment Details Patient Name: Date of Service: [name], [name] 09/21/2020 3:30 PM Medical Record Number: [id number] Patient Account Number: [id number] Date of Birth/Sex: Treating RN: 06-24-1985 (35 y.o. [name] Primary Care Reyanna Baley: [name] Other Clinician: Referring Antonietta Lansdowne: Treating Sincere Liuzzi/Extender: [name] in Treatment: 47 Wound Status Wound Number: 5 Primary Etiology: Diabetic Wound/Ulcer of the Lower Extremity Wound Location: Right, Lateral Metatarsal head fifth Wound Status: Open Wounding Event: Gradually Appeared Comorbid History: Type II Diabetes Date Acquired: 07/07/2020 Weeks Of Treatment: 9 Clustered Wound: No Photos Wound Measurements Length: (cm) 2.4 Width: (cm) 2.7 Depth: (cm) 0.4 Area: (cm) 5.089 Volume: (cm) 2.036 % Reduction in Area: 42.1% % Reduction in Volume: 53.7% Epithelialization: Small (1-33%) Tunneling: No Undermining: No Wound Description Classification: Grade 2 Wound Margin: Distinct, outline attached Exudate Amount: Medium Exudate Type: Serosanguineous Exudate Color: red, brown Wound Bed Granulation Amount: Large (67-100%) Granulation Quality: Red Necrotic Amount: None Present (0%) Foul Odor After Cleansing: No Slough/Fibrino No Exposed Structure Fascia Exposed: No Fat Layer (Subcutaneous Tissue) Exposed: Yes Tendon Exposed: No Muscle Exposed: No Joint Exposed: No Bone Exposed: No Assessment Notes Calloused periwound Electronic Signature(s) Signed:  09/22/2020 10:33:03 AM By: Sandre Kitty Signed: 09/22/2020 4:28:34 PM By: [name] Previous Signature: 09/21/2020 5:37:12 PM Version By: [name] RN, BSN Previous Signature: 09/21/2020 6:31:05 PM Version By: [name] Entered By: Sandre Kitty on 09/22/2020 10:19:33 -------------------------------------------------------------------------------- Wound Assessment Details Patient Name: Date of Service: [name], [name] 09/21/2020 3:30 PM Medical Record Number: [id number] Patient Account Number: [id number] Date of Birth/Sex: Treating RN: December 03, 1985 (35 y.o. [name] Primary Care Darris Carachure: [name] Other Clinician: Referring Byrdie Miyazaki: Treating Jalaiyah Throgmorton/Extender: [name] in Treatment: 47 Wound  Status Wound Number: 7 Primary Etiology: Diabetic Wound/Ulcer of the Lower Extremity Wound Location: Right, Anterior Ankle Wound Status: Open Wounding Event: Gradually Appeared Comorbid History: Type II Diabetes Date Acquired: 08/17/2020 Weeks Of Treatment: 5 Clustered Wound: No Photos Wound Measurements Length: (cm) 1.2 Width: (cm) 1 Depth: (cm) 0.1 Area: (cm) 0.942 Volume: (cm) 0.094 % Reduction in Area: 58.5% % Reduction in Volume: 58.6% Epithelialization: Medium (34-66%) Tunneling: No Undermining: No Wound Description Classification: Grade 2 Wound Margin: Distinct, outline attached Exudate Amount: Medium Exudate Type: Serosanguineous Exudate Color: red, brown Foul Odor After Cleansing: No Slough/Fibrino Yes Wound Bed Granulation Amount: Large (67-100%) Exposed Structure Granulation Quality: Red Fascia Exposed: No Necrotic Amount: Small (1-33%) Fat Layer (Subcutaneous Tissue) Exposed: Yes Necrotic Quality: Adherent Slough Tendon Exposed: No Muscle Exposed: No Joint Exposed: No Bone Exposed: No Electronic Signature(s) Signed: 09/22/2020 10:33:03 AM By: Sandre Kitty Signed: 09/22/2020 4:28:34 PM By:  [name] Previous Signature: 09/21/2020 6:31:05 PM Version By: [name] Entered By: Sandre Kitty on 09/22/2020 10:19:50 -------------------------------------------------------------------------------- Vitals Details Patient Name: Date of Service: [name], [name] 09/21/2020 3:30 PM Medical Record Number: [id number] Patient Account Number: [id number] Date of Birth/Sex: Treating RN: 17-Sep-1985 (35 y.o. [name] Primary Care Jameshia Hayashida: [name] Other Clinician: Referring Celie Desrochers: Treating Constancia Geeting/Extender: [name] in Treatment: 47 Vital Signs Time Taken: 15:50 Temperature (F): 99.2 Height (in): 77 Pulse (bpm): 104 Weight (lbs): 280 Respiratory Rate (breaths/min): 18 Body Mass Index (BMI): 33.2 Blood Pressure (mmHg): 120/67 Capillary Blood Glucose (mg/dl): 137 Reference Range: 80 - 120 mg / dl Electronic Signature(s) Signed: 09/21/2020 6:31:05 PM By: [name] Previous Signature: 09/21/2020 3:50:58 PM Version By: [name] Entered By: [name] on 09/21/2020 15:54:31

## 2020-09-29 ENCOUNTER — Other Ambulatory Visit: Payer: Self-pay

## 2020-09-29 ENCOUNTER — Encounter (HOSPITAL_BASED_OUTPATIENT_CLINIC_OR_DEPARTMENT_OTHER): Payer: BC Managed Care – PPO | Admitting: Internal Medicine

## 2020-09-29 DIAGNOSIS — E11621 Type 2 diabetes mellitus with foot ulcer: Secondary | ICD-10-CM | POA: Diagnosis not present

## 2020-09-29 NOTE — Progress Notes (Addendum)
[name], Max ([id number]) Visit Report for 09/29/2020 Debridement Details Patient Name: Date of Service: [name] 09/29/2020 3:00 PM Medical Record Number: [id number] Patient Account Number: [id number] Date of Birth/Sex: Treating RN: 1985-07-28 (35 y.o. [name], [name] Primary Care Provider: Seward [name] Other Clinician: Referring Provider: Treating Provider/Extender: [name] in Treatment: 48 Debridement Performed for Assessment: Wound #3 Left,Lateral Foot Performed By: Physician [name]., MD Debridement Type: Debridement Severity of Tissue Pre Debridement: Fat layer exposed Level of Consciousness (Pre-procedure): Awake and Alert Pre-procedure Verification/Time Out Yes - 16:55 Taken: Start Time: 16:55 Pain Control: Lidocaine T Area Debrided (L x W): otal 2 (cm) x 2 (cm) = 4 (cm) Tissue and other material debrided: Viable, Non-Viable, Callus, Slough, Subcutaneous, Skin: Dermis , Skin: Epidermis, Slough Level: Skin/Subcutaneous Tissue Debridement Description: Excisional Instrument: Curette Bleeding: Minimum Hemostasis Achieved: Pressure End Time: 16:55 Procedural Pain: 0 Post Procedural Pain: 0 Response to Treatment: Procedure was tolerated well Level of Consciousness (Post- Awake and Alert procedure): Post Debridement Measurements of Total Wound Length: (cm) 2 Width: (cm) 2 Depth: (cm) 0.5 Volume: (cm) 1.571 Character of Wound/Ulcer Post Debridement: Improved Severity of Tissue Post Debridement: Fat layer exposed Post Procedure Diagnosis Same as Pre-procedure Electronic Signature(s) Signed: 09/29/2020 5:29:17 PM By: Max Ham MD Signed: 09/29/2020 5:48:53 PM By: Max Hammock RN Entered By: [name] on 09/29/2020 17:17:55 -------------------------------------------------------------------------------- Debridement Details Patient Name: Date of Service: [name], [name] 09/29/2020 3:00  PM Medical Record Number: [id number] Patient Account Number: [id number] Date of Birth/Sex: Treating RN: Jul 28, 1985 (35 y.o. [name], [name] Primary Care Provider: Seward [name] Other Clinician: Referring Provider: Treating Provider/Extender: [name] in Treatment: 41 Debridement Performed for Assessment: Wound #5 Right,Lateral Metatarsal head fifth Performed By: Physician [name]., MD Debridement Type: Debridement Severity of Tissue Pre Debridement: Bone involvement without necrosis Level of Consciousness (Pre-procedure): Awake and Alert Pre-procedure Verification/Time Out Yes - 16:55 Taken: Start Time: 16:55 Pain Control: Lidocaine T Area Debrided (L x W): otal 2.4 (cm) x 2.5 (cm) = 6 (cm) Tissue and other material debrided: Viable, Non-Viable, Bone, Callus, Slough, Subcutaneous, Skin: Dermis , Skin: Epidermis, Slough Level: Skin/Subcutaneous Tissue/Muscle/Bone Debridement Description: Excisional Instrument: Curette Bleeding: Minimum Hemostasis Achieved: Pressure End Time: 16:55 Procedural Pain: 0 Post Procedural Pain: 0 Response to Treatment: Procedure was tolerated well Level of Consciousness (Post- Awake and Alert procedure): Post Debridement Measurements of Total Wound Length: (cm) 2.4 Width: (cm) 2.5 Depth: (cm) 0.8 Volume: (cm) 3.77 Character of Wound/Ulcer Post Debridement: Improved Severity of Tissue Post Debridement: Bone involvement without necrosis Post Procedure Diagnosis Same as Pre-procedure Electronic Signature(s) Signed: 09/29/2020 5:29:17 PM By: Max Ham MD Signed: 09/29/2020 5:48:53 PM By: Max Hammock RN Entered By: [name] on 09/29/2020 17:18:12 -------------------------------------------------------------------------------- HPI Details Patient Name: Date of Service: [name], [name] 09/29/2020 3:00 PM Medical Record Number: [id number] Patient Account Number: [id number] Date of  Birth/Sex: Treating RN: 1985/09/28 (35 y.o. 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 He comes in for review of a wound over the plantar fifth metatarsal head extending into the lateral part of the foot. He was followed for this previously by his podiatrist Dr. Cornelius [name]. As the patient tells his story he went to see podiatry first for  a swelling he developed on the lateral part of his fifth metatarsal head in May. He states this was "open" by podiatry and the area closed. He was followed up in June and it was again opened callus removed and it closed promptly. There were plans being made for surgery on the fifth metatarsal head in June however his blood sugar was apparently too high for anesthesia. Apparently the area was debrided and opened again in June and it is never closed since. Looking over the records from podiatry I am really not able to follow this. It was clear when he was first seen it was before 5/14 at that point he already had a wound. By 5/17 the ulcer was resolved. I do not see anything about a procedure. On 5/28 noted to have pre-ulcerative moderate keratosis. X-ray noted 1/5 contracted toe and tailor's bunion and metatarsal deformity. On a visit date on 09/28/2018 the dorsal part of the left foot it healed and resolved. There was concern about swelling in his lower extremity he was sent to the ER.. As far as I can tell he was seen in the ER on 7/12 with an ulcer on his left foot. A DVT rule out of the left leg was negative. I do not think I have complete records from podiatry but I am not able to verify the procedures this patient states he had. He states after the last procedure the wound has never  closed although I am not able to follow this in the records I have from podiatry. He has not had a recent x-ray The patient has been using Neosporin on the wound. He is wearing a Darco shoe. He is still very active up on his foot working and exercising. Past medical history; type 2 diabetes ketosis-prone, leg swelling with a negative DVT study in July. Non-smoker ABI in our clinic was 0.85 on the left 10/16; substantial wound on the plantar left fifth met head extending laterally almost to the dorsal fifth MTP. We have been using silver alginate we gave him a Darco forefoot off loader. An x-ray did not show evidence of osteomyelitis did note soft tissue emphysema which I think was due to gas tracking through an open wound. There is no doubt in my mind he requires an MRI 10/23; MRI not booked until 3 November at the earliest this is largely due to his glucose sensor in the right arm. We have been using silver alginate. There has been an improvement 10/29; I am still not exactly sure when his MRI is booked for. He says it is the third but it is the 10th in epic. This definitely needs to be done. He is running a low-grade fever today but no other symptoms. No real improvement in the 1 02/26/2019 patient presents today for a follow-up visit here in our clinic he is last been seen in the clinic on October 29. Subsequently we were working on getting MRI to evaluate and see what exactly was going on and where we would need to go from the standpoint of whether or not he had osteomyelitis and again what treatments were going be required. Subsequently the patient ended up being admitted to the hospital on 02/07/2019 and was discharged on 02/14/2019. This is a somewhat interesting admission with a discharge diagnosis of pneumonia due to COVID-19 although he was positive for COVID-19 when tested at the urgent care but negative x2 when he was actually in the hospital. With that being said he did have acute  respiratory failure with hypoxia and it was noted he also have a left foot ulceration with osteomyelitis. With that being said he did require oxygen for his pneumonia and I level 4 L. He was placed on antivirals and steroids for the COVID-19. He was also transferred to the [facility] at one point. Nonetheless he did subsequently discharged home and since being home has done much better in that regard. The CT angiogram did not show any pulmonary embolism. With regard to the osteomyelitis the patient was placed on vancomycin and Zosyn while in the hospital but has been changed to Augmentin at discharge. It was also recommended that he follow- up with wound care and podiatry. Podiatry however wanted him to see [name] according to the patient prior to them doing anything further. His hemoglobin A1c was 9.9 as noted in the hospital. Have an MRI of the left foot performed while in the hospital on 02/04/2019. 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 There is an overlying plantar open wound noted an abscess tracking back along the lateral aspect of the fifth metatarsal shaft. There is otherwise diffuse cellulitis and mild fasciitis without findings of polymyositis. The patient did have recently pneumonia secondary to COVID-19 I looked in the chart through epic and it does appear that the patient may need to have an additional x-ray just to ensure everything is cleared and that he has no airspace disease prior to putting him into the [name]. 03/05/2019; patient was readmitted to the clinic last week. He was hospitalized twice for a viral upper respiratory tract infection from 11/1 through 11/4 and then 11/5 through 11/12 ultimately this turned out to be Covid pneumonitis. Although he was discharged on oxygen he is not using it. He says he feels fine. He has no exercise limitation no cough no sputum. His O2 sat  in our clinic today was 100% on room air. 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 He received Vanco and Zosyn in the hospital and then was discharged on 2 weeks of Augmentin. I do not see any relevant cultures. He was supposed to follow-up with infectious disease but I do not see that he has an appointment. 12/8; patient saw Dr. Novella Olive of infectious disease last week. He felt that he had had adequate antibiotic therapy. He did not go to follow-up with Dr. Amalia Hailey of podiatry and I have again talked to him about the pros and cons of this. He does not want to consider a ray amputation of this time. He is aware of the risks of recurrence, migration etc. He started HBO today and tolerated this well. He can complete the Augmentin that I gave him last week. I have looked over the lab work that Dr. Chana Bode ordered his C-reactive protein was 3.3 and his sedimentation rate was 17. The C-reactive protein is never really been measurably that high in this patient 12/15; not much change in the wound today however he has undermining along the lateral part of the foot again more extensively than last week. He has some rims of epithelialization. We have been using silver alginate. He is undergoing hyperbarics but did not dive today 12/18; in for his obligatory first total contact cast change. Unfortunately there was pus coming from the undermining area around his fifth metatarsal head. This was cultured  but will preclude reapplication of a cast. He is seen in conjunction with HBO 12/24; patient had staph lugdunensis in the wound in the undermining area laterally last time. We put him on doxycycline which should have covered this. The wound looks better today. I am going to give him another week of doxycycline before reattempting the total contact cast 12/31; the patient is completing antibiotics.  Hemorrhagic debris in the distal part of the wound with some undermining distally. He also had hyper granulation. Extensive debridement with a #5 curette. The infected area that was on the lateral part of the fifth met head is closed over. I do not think he needs any more antibiotics. Patient was seen prior to HBO. Preparations for a total contact cast were made in the cast will be placed post hyperbarics 04/11/19; once again the patient arrives today without complaint. He had been in a cast all week noted that he had heavy drainage this week. This resulted in large raised areas of macerated tissue around the wound 1/14; wound bed looks better slightly smaller. Hydrofera Blue has been changing himself. He had a heavy drainage last week which caused a lot of maceration around the wound so I took him out of a total contact cast he says the drainage is actually better this week He is seen today in conjunction with HBO 1/21; returns to clinic. He was up in [place] for a day or 2 attending a funeral. He comes back in with the wound larger and with a large area of exposed bone. He had osteomyelitis and septic arthritis of the fifth left metatarsal head while he was in hospital. He received IV antibiotics in the hospital for a prolonged period of time then 3 weeks of Augmentin. Subsequently I gave him 2 weeks of doxycycline for more superficial wound infection. When I saw this last week the wound was smaller the surface of the wound looks satisfactory. 1/28; patient missed hyperbarics today. Bone biopsy I did last time showed Enterococcus faecalis and Staphylococcus lugdunensis . He has a wide area of exposed bone. We are going to use silver alginate as of today. I had another ethical discussion with the patient. 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 In this situation I think the likelihood of healing this is low. Therefore I have recommended a ray amputation and with  the patient's agreement I have referred him to Dr. Doran Durand. The other issue is that his compliance with hyperbarics has been minimal because of his work schedule and given his underlying decision I am going to stop this today READMISSION 10/24/2019 MRI 09/29/2019 left foot IMPRESSION: 1. Apparent skin ulceration inferior and lateral to the 5th metatarsal base with underlying heterogeneous T2 signal and enhancement in the subcutaneous fat. Small peripherally enhancing fluid collections along the plantar and lateral aspects of the 5th metatarsal base suspicious for abscesses. 2. Interval amputation through the mid 5th metatarsal with nonspecific low-level marrow edema and enhancement. Given the proximity to the adjacent soft tissue inflammatory changes, osteomyelitis cannot be excluded. 3. The additional bones appear unremarkable. MRI 09/29/2019 right foot IMPRESSION: 1. Soft tissue ulceration lateral to the 5th MTP joint. There is low-level T2 hyperintensity within the 4th and 5th metatarsal heads and adjacent proximal phalanges without abnormal T1 signal or cortical destruction. These findings are nonspecific and could be seen with early marrow edema, hyperemia or early osteomyelitis. No evidence of septic joint. 2. Mild tenosynovitis and synovial enhancement associated with the extensor digitorum  tendons at the level of the midfoot. 3. Diffuse low-level muscular T2 hyperintensity and enhancement, most consistent with diabetic myopathy. LEFT FOOT BONE Methicillin resistant staphylococcus aureus Staphylococcus lugdunensis MIC MIC CIPROFLOXACIN >=8 RESISTANT Resistant <=0.5 SENSI... Sensitive CLINDAMYCIN <=0.25 SENS... Sensitive >=8 RESISTANT Resistant ERYTHROMYCIN >=8 RESISTANT Resistant >=8 RESISTANT Resistant GENTAMICIN <=0.5 SENSI... Sensitive <=0.5 SENSI... Sensitive Inducible Clindamycin NEGATIVE Sensitive NEGATIVE Sensitive OXACILLIN >=4 RESISTANT Resistant 2 SENSITIVE  Sensitive RIFAMPIN <=0.5 SENSI... Sensitive <=0.5 SENSI... Sensitive TETRACYCLINE <=1 SENSITIVE Sensitive <=1 SENSITIVE Sensitive TRIMETH/SULFA <=10 SENSIT Sensitive <=10 SENSIT Sensitive ... Marland Kitchen.. VANCOMYCIN 1 SENSITIVE Sensitive <=0.5 SENSI... Sensitive Right foot bone . Component 3 wk ago Specimen Description BONE Special Requests RIGHT 4 METATARSAL SAMPLE B Gram Stain NO WBC SEEN NO ORGANISMS SEEN Culture RARE METHICILLIN RESISTANT STAPHYLOCOCCUS AUREUS NO ANAEROBES ISOLATED Performed at [facility], [place] [address], [place], [place] [address] Report Status 10/08/2019 FINAL Organism ID, Bacteria METHICILLIN RESISTANT STAPHYLOCOCCUS AUREUS Resulting Agency [facility] Susceptibility Methicillin resistant staphylococcus aureus MIC CIPROFLOXACIN >=8 RESISTANT Resistant CLINDAMYCIN <=0.25 SENS... Sensitive ERYTHROMYCIN >=8 RESISTANT Resistant GENTAMICIN <=0.5 SENSI... Sensitive Inducible Clindamycin NEGATIVE Sensitive OXACILLIN >=4 RESISTANT Resistant RIFAMPIN <=0.5 SENSI... Sensitive TETRACYCLINE <=1 SENSITIVE Sensitive TRIMETH/SULFA <=10 SENSIT Sensitive ... VANCOMYCIN 1 SENSITIVE Sensitive This is a patient we had in clinic earlier this year with a wound over his left fifth metatarsal head. 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 Doran Durand on 05/16/2019. He seems to have developed problems with wounds on his bilateral feet in June 2021 although he may have had problems earlier than this. He was in an urgent care with a right foot ulcer on 09/26/2019 and given a course of doxycycline. This was apparently after having trouble getting into see orthopedics. He was seen by podiatry on 09/28/2019 noted  to have bilateral lower extremity ulcers including the left lateral fifth metatarsal base and the right subfifth met head. It was noted that had purulent drainage at that time. He required hospitalization from 6/20 through 7/2. This was because of worsening right foot wounds. He underwent bilateral operative incision and drainage and bone biopsies bilaterally. Culture results are listed above. He has been referred back to clinic by Dr. Jacqualyn Posey of podiatry. He is also followed by Dr. Megan Salon who saw him yesterday. He was discharged from hospital on Zyvox Flagyl and Levaquin and yesterday changed to doxycycline Flagyl and Levaquin. His inflammatory markers on 6/26 showed a sedimentation rate of 129 and a C-reactive protein of 5. This is improved to 14 and 1.3 respectively. This would indicate improvement. ABIs in our clinic today were 1.23 on the right and 1.20 on the left 11/01/2019 on evaluation today patient appears to be doing fairly well in regard to the wounds on his feet at this point. Fortunately there is no signs of active infection at this time. No fevers, chills, nausea, vomiting, or diarrhea. He currently is seeing infectious disease and still under their care at this point. Subsequently he also has both wounds which she has not been using collagen on as he did not receive that in his packaging he did not call us and let us know that. Apparently that just was missed on the order. Nonetheless we will get that straightened out today. 8/9-Patient returns  for bilateral foot wounds, using Prisma with hydrogel moistened dressings, and the wounds appear stable. Patient using surgical shoes, avoiding much pressure or weightbearing as much as possible 8/16; patient has bilateral foot wounds. 1 on the right lateral foot proximally the other is on the left mid lateral foot. Both required debridement of callus and thick skin around the wounds. We have been using silver collagen 8/27; patient has  bilateral lateral foot wounds. The area on the left substantially surrounded by callus and dry skin. This was removed from the wound edge. The underlying wound is small. The area on the right measured somewhat smaller today. 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 Unfortunately I did not update his antibiotics during today's visit. 9/10 I reviewed Dr. Hale Bogus last notes he felt he had completed antibiotics his inflammatory markers were reasonably well controlled. He has a small wound on the lateral left foot and a tiny area on the right which is just above closed. He is using Hydrofera Blue with border foam he has bilateral surgical shoes 9/24; 2 week f/u. doing well. right foot is closed. left foot still undermined. 10/14; right foot remains closed at the fifth met head. The area over the base of the left fifth metatarsal has a small open area but considerable undermining towards the plantar foot. Thick callus skin around this suggests an adequate pressure relief. We have talked about this. He says he is going to go back into his cam boot. I suggested a total contact cast he did not seem enamored with this suggestion 10/26; left foot base of the fifth metatarsal. Same condition as last time. He has skin over the area with an open wound however the skin is not adherent. He went to see Dr. Earleen Newport who did an x-ray and culture of his foot I have not reviewed the x-ray but the patient was not told anything. He is on doxycycline 11/11; since the patient was last here he was in the emergency room on 10/30 he was concerned about swelling in the left foot. They did not do any cultures or x-rays. They changed his antibiotics to cephalexin. Previous culture showed group B strep. The cephalexin is appropriate as doxycycline has less than predictable coverage. Arrives in clinic today with swelling over this area under the wound. He also has a  new wound on the right fifth metatarsal head 11/18; the patient has a difficult wound on the lateral aspect of the left fifth metatarsal head. The wound was almost ballotable last week I opened it slightly expecting to see purulence however there was just bleeding. I cultured this this was negative. X-ray unchanged. We are trying to get an MRI but I am not sure were going to be able to get this through his insurance. He also has an area on the right lateral fifth metatarsal head this looks healthier 12/3; the patient finally got our MRI. Surprisingly this did not show osteomyelitis. I did show the soft tissue ulceration at the lateral plantar aspect of the fifth metatarsal base with a tiny residual 6 mm abscess overlying the superficial fascia I have tried to culture this area I have not been able to get this to grow anything. Nevertheless the protruding tissue looks aggravated. I suspect we should try to treat the underlying "abscess with broad-spectrum antibiotics. I am going to start him on Levaquin and Flagyl. He has much less edema in his legs and I am  going to continue to wrap his legs and see him weekly 12/10. I started Levaquin and Flagyl on him last week. He just picked up the Flagyl apparently there was some delay. The worry is the wound on the left fifth metatarsal base which is substantial and worsening. His foot looks like he inverts at the ankle making this a weightbearing surface. Certainly no improvement in fact I think the measurements of this are somewhat worse. We have been using 12/17; he apparently just got the Levaquin yesterday this is 2 weeks after the fact. He has completed the Flagyl. The area over the left fifth metatarsal base still has protruding granulation tissue although it does not look quite as bad as it did some weeks ago. He has severe bilateral lymphedema although we have not been treating him for wounds on his legs this is definitely going to require compression.  There was so much edema in the left I did not wish to put him in a total contact cast today. I am going to increase his compression from 3-4 layer. The area on the right lateral fifth met head actually look quite good and superficial. 12/23; patient arrived with callus on the right fifth met head and the substantial hyper granulated callused wound on the base of his fifth metatarsal. He says he is completing his Levaquin in 2 days but I do not think that adds up with what I gave him but I will have to double check this. We are using Hydrofera Blue on both areas. My plan is to put the left leg in a cast the week after New Year's 04/06/2020; patient's wounds about the same. Right lateral fifth metatarsal head and left lateral foot over the base of the fifth metatarsal. There is undermining on the left lateral foot which I removed before application of total contact cast continuing with Hydrofera Blue new. Patient tells me he was seen by endocrinology today lab work was done [Dr. Kerr]. Also wondering whether he was referred to cardiology. I went over some lab work from previously does not have chronic renal failure certainly not nephrotic range proteinuria he does have very poorly controlled diabetes but this is not his most updated lab work. Hemoglobin A1c has been over 11 1/10; the patient had a considerable amount of leakage towards mid part of his left foot with macerated skin however the wound surface looks better the area on the right lateral fifth met head is better as well. I am going to change the dressing on the left foot under the total contact cast to silver alginate, continue with Hydrofera Blue on the right. 1/20; patient was in the total contact cast for 10 days. Considerable amount of drainage although the skin around the wound does not look too bad on the left foot. The area on the right fifth metatarsal head is closed. Our nursing staff reports large amount of drainage out of the left  lateral foot wound 1/25; continues with copious amounts of drainage described by our intake staff. PCR culture I did last week showed E. coli and Enterococcus faecalis and low quantities. Multiple resistance genes documented including extended spectrum beta lactamase, MRSA, MRSE, quinolone, tetracycline. The wound is not quite as good this week as it was 5 days ago but about the same size 2/3; continues with copious amounts of malodorous drainage per our intake nurse. The PCR culture I did 2 weeks ago showed E. coli and low quantities of Enterococcus. There were multiple resistance genes detected.  I put Neosporin on him last week although this does not seem to have helped. The wound is slightly deeper today. Offloading continues to be an issue here although with the amount of drainage she has a total contact cast is just not going to work 2/10; moderate amount of drainage. Patient reports he cannot get his stocking on over the dressing. I told him we have to do that the nurse gave him suggestions on how to make this work. The wound is on the bottom and lateral part of his left foot. Is cultured predominantly grew low amounts of Enterococcus, E. coli and anaerobes. There were multiple resistance genes detected including extended spectrum beta lactamase, quinolone, tetracycline. I could not think of an easy oral combination to address this so for now I am going to do topical antibiotics provided by [facility] I think the main agents here are vancomycin and an aminoglycoside. We have to be able to give him access to the wounds to get the topical antibiotic on 2/17; moderate amount of drainage this is unchanged. He has his Keystone topical antibiotic against the deep tissue culture organisms. He has been using this and changing the dressing daily. Silver alginate on the wound surface. 2/24; using Keystone antibiotic with silver alginate on the top. He had too much drainage for a total contact cast at one  point although I think that is improving and I think in the next week or 2 it might be possible to replace a total contact cast I did not do this today. In general the wound surface looks healthy however he continues to have thick rims of skin and subcutaneous tissue around the wide area of the circumference which I debrided 06/04/2020 upon evaluation today patient appears to be doing well in regard to his wound. I do feel like he is showing signs of improvement. There is little bit of callus and dead tissue around the edges of the wound as well as what appears to be a little bit of a sinus tract that is off to the side laterally I would perform debridement to clear that away today. 3/17; left lateral foot. The wound looks about the same as I remember. Not much depth surface looks healthy. No evidence of infection 3/25; left lateral foot. Wound surface looks about the same. Separating epithelium from the circumference. There really is no evidence of infection here however not making progress by my view 3/29; left lateral foot. Surface of the wound again looks reasonably healthy still thick skin and subcutaneous tissue around the wound margins. There is no evidence of infection. One of the concerns being brought up by the nurses has again the amount of drainage vis--vis continued use of a total contact cast 4/5; left lateral foot at roughly the base of the fifth metatarsal. Nice healthy looking granulated tissue with rims of epithelialization. The overall wound measurements are not any better but the tissue looks healthy. The only concern is the amount of drainage although he has no surrounding maceration with what we have been doing recently to absorb fluid and protect his skin. He also has lymphedema. 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 He states that the kerlix/coban under the TCC rolled down and could  not get it back up. He has been using an offloading scooter and has somehow hurt his right foot using this device. This happened last week. He states that the side of his right foot developed a blister and opened. The top of his foot also has a few small open wounds he thinks is due to his socks rubbing in his shoes. He has not been using any dressings to the wound. He denies purulent drainage, fever/chills or erythema to the wounds. 4/22; patient presents for 1 week follow-up. He developed new wounds to the right foot that were evaluated at last clinic visit. He continues to have a total contact cast to the left leg and he reports no issues. He has been using silver collagen to the right foot wounds with no issues. He denies purulent drainage, fever/chills or erythema to the right foot wounds. He has no complaints today 4/25; patient presents for 1 week follow-up. He has a total contact cast of the left leg and reports no issues. He has been using silver alginate to the right foot wound. He denies purulent drainage, fever/chills or erythema to the right foot wounds. 5/2 patient presents for 1 week follow-up. T contact cast on the left. The wound which is on the base of the plantar foot at the base of the fifth metatarsal otal actually looks quite good and dimensions continue to gradually contract. HOWEVER the area on the right lateral fifth metatarsal head is much larger than what I remember from 2 weeks ago. Once more is he has significant levels of hypergranulation. Noteworthy that he had this same hyper granulated response on his wound on the left foot at one point in time. So much so that he I thought there was an underlying fluid collection. Based on this I think this just needs debridement. 5/9; the wound on the left actually continues to  be gradually smaller with a healthy surface. Slight amount of drainage and maceration of the skin around but not too bad. However he has a large wound over the right fifth metatarsal head very much in the same configuration as his left foot wound was initially. I used silver nitrate to address the hyper granulated tissue no mechanical debridement 5/16; area on the left foot did not look as healthy this week deeper thick surrounding macerated skin and subcutaneous tissue. The area on the right foot fifth met head was about the same The area on the right ankle that we identified last week is completely broken down into an open wound presumably a stocking rubbing issue 5/23; patient has been using a total contact cast to the left side. He has been using silver alginate underneath. He has also been using silver alginate to the right foot wounds. He has no complaints today. He denies any signs of infection. 5/31; the left-sided wound looks some better measure smaller surface granulation looks better. We have been using silver alginate under the total contact cast The large area on his right fifth met head and right dorsal foot look about the same still using silver alginate 6/6; neither side is good as I was hoping although the surface area dimensions are better. A lot of maceration on his left and right foot around the wound edge. Area on the dorsal right foot looks better. He says he was traveling. I am not sure what does the amount of maceration around the plantar wounds may be drainage issues 6/13; in general the wound surfaces look quite good on both sides.  Macerated skin and raised edges around the wound required debridement although in general especially on the left the surface area seems improved. The area on the right dorsal ankle is about the same I thought this would not be such a problem to close 6/20; not much change in either wound although the one on the right looks a little better. Both  wounds have thick macerated edges to the skin requiring debridements. We have been using silver alginate. The area on the dorsal right ankle is still open I thought this would be closed. 6/28; patient comes in today with a marked deterioration in the right foot wound fifth met head. Wide area of exposed bone this is a drastic change from last time. The area on the left there we have been casting is stagnant. We have been using silver alginate in both wound areas. Electronic Signature(s) Signed: 09/29/2020 5:29:17 PM By: Max Ham MD Entered By: [name] on 09/29/2020 17:19:10 -------------------------------------------------------------------------------- Physical Exam Details Patient Name: Date of Service: [name], [name] 09/29/2020 3:00 PM Medical Record Number: [id number] Patient Account Number: [id number] Date of Birth/Sex: Treating RN: 07-Mar-1986 (35 y.o. [name] Primary Care Provider: Seward [name] Other Clinician: Referring Provider: Treating Provider/Extender: [name] in Treatment: 49 Constitutional Patient is hypertensive.. Pulse regular and within target range for patient.Marland Kitchen Respirations regular, non-labored and within target range.. Temperature is normal and within the target range for the patient.Marland Kitchen Appears in no distress. Notes Wound exam; again macerated tissue around the wound with thick callus on the left. I used pickups and a #15 scalpel to remove this and attempt to get an edge to epithelialize this area which we have not been able to get the date. Hemostasis with silver nitrate and a pressure dressing. On the right he looked healthy last week this week he is a wide area of exposed bone. I used a #5 curette to do a bone shaving and obtain a specimen for culture. He will also need an x-ray of this area Electronic Signature(s) Signed: 09/29/2020 5:29:17 PM By: Max Ham MD Entered By: [name] on 09/29/2020  17:20:19 -------------------------------------------------------------------------------- Physician Orders Details Patient Name: Date of Service: [name], [name] 09/29/2020 3:00 PM Medical Record Number: [id number] Patient Account Number: [id number] Date of Birth/Sex: Treating RN: 06-Feb-1986 (35 y.o. [name] Primary Care Provider: Seward [name] Other Clinician: Referring Provider: Treating Provider/Extender: [name] in Treatment: 41 Verbal / Phone Orders: No Diagnosis Coding Follow-up Appointments ppointment in 1 week. - ***60 minutes for cast*** - with Dr. Dellia Nims Return A Bathing/ Shower/ Hygiene May shower with protection but do not get wound dressing(s) wet. - use a cast protector to left leg. Edema Control - Lymphedema / SCD / Other Bilateral Lower Extremities Elevate legs to the level of the heart or above for 30 minutes daily and/or when sitting, a frequency of: - throughout the day Avoid standing for long periods of time. Exercise regularly Moisturize legs daily. - right leg every night before bed. Compression stocking or Garment 20-30 mm/Hg pressure to: - Apply to right leg in the morning and remove at night. Off-Loading Total Contact Cast to Left Lower Extremity Open toe surgical shoe to: - right foot Wound Treatment Wound #3 - Foot Wound Laterality: Left, Lateral Cleanser: Soap and Water 1 x Per Week/30 Days Discharge Instructions: May shower and wash wound with dial antibacterial soap and water prior to dressing change. Peri-Wound Care: Zinc Oxide Ointment 30g  tube 1 x Per Week/30 Days Discharge Instructions: Apply Zinc Oxide to periwound with each dressing change as needed. Prim Dressing: Hydrofera Blue Classic Foam, 2x2 in 1 x Per Week/30 Days ary Discharge Instructions: Moisten with saline prior to applying to wound bed Secondary Dressing: Woven Gauze Sponge, Non-Sterile 4x4 in 1 x Per Week/30 Days Discharge  Instructions: Apply over primary dressing as directed. Secondary Dressing: ABD Pad, 8x10 1 x Per Week/30 Days Discharge Instructions: T down before applying kerlix. Apply over primary dressing as directed. ape Secondary Dressing: Zetuvit Plus 4x8 in 1 x Per Week/30 Days Discharge Instructions: Apply over primary dressing as directed. Secured With: [employer], 4.5x3.1 (in/yd) 1 x Per Week/30 Days Discharge Instructions: Secure with Kerlix as directed. Compression Wrap: Kerlix Roll 4.5x3.1 (in/yd) 1 x Per Week/30 Days Discharge Instructions: Apply Kerlix and Coban compression under TCC. Compression Wrap: Coban Self-Adherent Wrap 4x5 (in/yd) 1 x Per Week/30 Days Discharge Instructions: Apply over Kerlix as directed. Wound #5 - Metatarsal head fifth Wound Laterality: Right, Lateral Cleanser: Normal Saline (Generic) Every Other Day/15 Days Discharge Instructions: Cleanse the wound with Normal Saline prior to applying a clean dressing using gauze sponges, not tissue or cotton balls. Cleanser: Soap and Water Every Other Day/15 Days Discharge Instructions: May shower and wash wound with dial antibacterial soap and water prior to dressing change. Prim Dressing: KerraCel Ag Gelling Fiber Dressing, 2x2 in (silver alginate) (Generic) Every Other Day/15 Days ary Discharge Instructions: Apply silver alginate to wound bed as instructed Secondary Dressing: ABD Pad, 5x9 (Generic) Every Other Day/15 Days Discharge Instructions: Apply over primary dressing as directed. Secondary Dressing: Optifoam Non-Adhesive Dressing, 4x4 in (Generic) Every Other Day/15 Days Discharge Instructions: Apply over primary dressing, cut to make foam donut to offload Secured With: Kerlix Roll Sterile, 4.5x3.1 (in/yd) (Generic) Every Other Day/15 Days Discharge Instructions: Secure with Kerlix as directed. Secured With: 532M Medipore H Soft Cloth Surgical Tape, 2x2 (in/yd) (Generic) Every Other Day/15 Days Discharge  Instructions: Secure dressing with tape as directed. Wound #7 - Ankle Wound Laterality: Right, Anterior Cleanser: Normal Saline (Generic) Every Other Day/15 Days Discharge Instructions: Cleanse the wound with Normal Saline prior to applying a clean dressing using gauze sponges, not tissue or cotton balls. Cleanser: Soap and Water Every Other Day/15 Days Discharge Instructions: May shower and wash wound with dial antibacterial soap and water prior to dressing change. Prim Dressing: KerraCel Ag Gelling Fiber Dressing, 2x2 in (silver alginate) (Generic) Every Other Day/15 Days ary Discharge Instructions: Apply silver alginate to wound bed as instructed Secondary Dressing: Woven Gauze Sponge, Non-Sterile 4x4 in (Generic) Every Other Day/15 Days Discharge Instructions: Apply over primary dressing as directed. Secondary Dressing: ABD Pad, 5x9 (Generic) Every Other Day/15 Days Discharge Instructions: Apply over primary dressing as directed. Secured With: [employer], 4.5x3.1 (in/yd) (Generic) Every Other Day/15 Days Discharge Instructions: Secure with Kerlix as directed. Secured With: 532M Medipore H Soft Cloth Surgical Tape, 2x2 (in/yd) (Generic) Every Other Day/15 Days Discharge Instructions: Secure dressing with tape as directed. Radiology X-ray, foot - right foot Electronic Signature(s) Signed: 09/29/2020 5:29:17 PM By: Max Ham MD Signed: 09/29/2020 5:48:53 PM By: Max Hammock RN Entered By: [name] on 09/29/2020 16:58:19 Prescription 09/29/2020 -------------------------------------------------------------------------------- Wik, Max Jabez Molner MD Patient Name: Provider: [birth year] [id number] Date of Birth: NPI#: Jerilynn Mages [id number] Sex: DEA #: [phone] [id number] Phone #: License #: [place] Patient Address: [address] [place], [place] [address] [place]  [place], [place]  [address] [phone] Allergies metformin Provider's Orders X-ray, foot - right foot Hand Signature: Date(s): Electronic Signature(s) Signed: 09/29/2020 5:29:17 PM By: Max Ham MD Signed: 09/29/2020 5:48:53 PM By: Max Hammock RN Entered By: [name] on 09/29/2020 16:58:20 -------------------------------------------------------------------------------- Problem List Details Patient Name: Date of Service: [name], [name] 09/29/2020 3:00 PM Medical Record Number: [id number] Patient Account Number: [id number] Date of Birth/Sex: Treating RN: 1985/12/18 (35 y.o. [name], [name] Primary Care Provider: Seward [name] Other Clinician: Referring Provider: Treating Provider/Extender: [name] in Treatment: 48 Active Problems ICD-10 Encounter Code Description Active Date MDM Diagnosis E11.621 Type 2 diabetes mellitus with foot ulcer 10/24/2019 No Yes L97.528 Non-pressure chronic ulcer of other part of left foot with other specified 10/24/2019 No Yes severity L97.518 Non-pressure chronic ulcer of other part of right foot with other specified 07/14/2020 No Yes severity 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 08/10/2020 No Yes Inactive Problems ICD-10 Code Description Active Date Inactive Date L97.518 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ity 10/24/2019 10/24/2019 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, right ankle and foot 10/24/2019 10/24/2019 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, left ankle and foot 10/24/2019 10/24/2019 B95.62 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 10/24/2019 10/24/2019 classified elsewhere Resolved Problems Electronic Signature(s) Signed: 09/29/2020 5:29:17 PM By: Max Ham MD Entered By: [name] on 09/29/2020  17:17:23 -------------------------------------------------------------------------------- Progress Note Details Patient Name: Date of Service: [name], [name] 09/29/2020 3:00 PM Medical Record Number: [id number] Patient Account Number: [id number] Date of Birth/Sex: Treating RN: February 03, 1986 (35 y.o. 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 He comes in for review of a wound over the plantar fifth metatarsal head extending into the lateral part of the foot. He was followed for this previously by his podiatrist Dr. Cornelius [name]. As the patient tells his story he went to see podiatry first for a swelling he developed on the lateral part of his fifth metatarsal head in May. He states this was "open" by podiatry and the area closed. He was followed up in June and it was again opened callus removed and it closed promptly. There were plans being made for surgery on the fifth metatarsal head in June however his blood sugar was apparently too high for anesthesia. Apparently the area was debrided and opened again in June and it is never closed since. Looking over the records from podiatry I am really not able to follow this. It was clear when he was first seen it was before 5/14 at that point he already had a wound. By 5/17 the ulcer was resolved. I do not see anything about a procedure. On 5/28 noted to have pre-ulcerative moderate keratosis. X-ray noted 1/5 contracted toe and tailor's bunion and metatarsal deformity. On a visit date on 09/28/2018 the dorsal part of the left foot it healed and resolved. There was concern about swelling in his lower extremity he was sent to the ER.. As far as I can tell he was seen in the ER on  7/12 with an ulcer on his left foot. A DVT rule out of the left leg was negative. I do not think I have complete records from podiatry but I am not able  to verify the procedures this patient states he had. He states after the last procedure the wound has never closed although I am not able to follow this in the records I have from podiatry. He has not had a recent x-ray The patient has been using Neosporin on the wound. He is wearing a Darco shoe. He is still very active up on his foot working and exercising. Past medical history; type 2 diabetes ketosis-prone, leg swelling with a negative DVT study in July. Non-smoker ABI in our clinic was 0.85 on the left 10/16; substantial wound on the plantar left fifth met head extending laterally almost to the dorsal fifth MTP. We have been using silver alginate we gave him a Darco forefoot off loader. An x-ray did not show evidence of osteomyelitis did note soft tissue emphysema which I think was due to gas tracking through an open wound. There is no doubt in my mind he requires an MRI 10/23; MRI not booked until 3 November at the earliest this is largely due to his glucose sensor in the right arm. We have been using silver alginate. There has been an improvement 10/29; I am still not exactly sure when his MRI is booked for. He says it is the third but it is the 10th in epic. This definitely needs to be done. He is running a low-grade fever today but no other symptoms. No real improvement in the 1 02/26/2019 patient presents today for a follow-up visit here in our clinic he is last been seen in the clinic on October 29. Subsequently we were working on getting MRI to evaluate and see what exactly was going on and where we would need to go from the standpoint of whether or not he had osteomyelitis and again what treatments were going be required. Subsequently the patient ended up being admitted to the hospital on 02/07/2019 and was discharged on 02/14/2019. This  is a somewhat interesting admission with a discharge diagnosis of pneumonia due to COVID-19 although he was positive for COVID-19 when tested at the urgent care but negative x2 when he was actually in the hospital. With that being said he did have acute respiratory failure with hypoxia and it was noted he also have a left foot ulceration with osteomyelitis. With that being said he did require oxygen for his pneumonia and I level 4 L. He was placed on antivirals and steroids for the COVID-19. He was also transferred to the [facility] at one point. Nonetheless he did subsequently discharged home and since being home has done much better in that regard. The CT angiogram did not show any pulmonary embolism. With regard to the osteomyelitis the patient was placed on vancomycin and Zosyn while in the hospital but has been changed to Augmentin at discharge. It was also recommended that he follow- up with wound care and podiatry. Podiatry however wanted him to see [name] according to the patient prior to them doing anything further. His hemoglobin A1c was 9.9 as noted in the hospital. Have an MRI of the left foot performed while in the hospital on 02/04/2019. 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 There is an overlying plantar open wound noted an abscess tracking back along the lateral aspect of the fifth metatarsal shaft. There is otherwise diffuse cellulitis and mild fasciitis without findings of polymyositis. The patient did have recently pneumonia secondary to COVID-19 I looked in the chart  through epic and it does appear that the patient may need to have an additional x-ray just to ensure everything is cleared and that he has no airspace disease prior to putting him into the [name]. 03/05/2019; patient was readmitted to the clinic last week. He was hospitalized twice for a viral upper respiratory tract  infection from 11/1 through 11/4 and then 11/5 through 11/12 ultimately this turned out to be Covid pneumonitis. Although he was discharged on oxygen he is not using it. He says he feels fine. He has no exercise limitation no cough no sputum. His O2 sat in our clinic today was 100% on room air. 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 He received Vanco and Zosyn in the hospital and then was discharged on 2 weeks of Augmentin. I do not see any relevant cultures. He was supposed to follow-up with infectious disease but I do not see that he has an appointment. 12/8; patient saw Dr. Novella Olive of infectious disease last week. He felt that he had had adequate antibiotic therapy. He did not go to follow-up with Dr. Amalia Hailey of podiatry and I have again talked to him about the pros and cons of this. He does not want to consider a ray amputation of this time. He is aware of the risks of recurrence, migration etc. He started HBO today and tolerated this well. He can complete the Augmentin that I gave him last week. I have looked over the lab work that Dr. Chana Bode ordered his C-reactive protein was 3.3 and his sedimentation rate was 17. The C-reactive protein is never really been measurably that high in this patient 12/15; not much change in the wound today however he has undermining along the lateral part of the foot again more extensively than last week. He has some rims of epithelialization. We have been using silver alginate. He is undergoing hyperbarics but did not dive today 12/18; in for his obligatory first total contact cast change. Unfortunately there was pus coming from the undermining area around his fifth metatarsal head. This was cultured but will preclude reapplication of a cast. He is seen in conjunction with HBO 12/24; patient had staph lugdunensis in the wound in the undermining area  laterally last time. We put him on doxycycline which should have covered this. The wound looks better today. I am going to give him another week of doxycycline before reattempting the total contact cast 12/31; the patient is completing antibiotics. Hemorrhagic debris in the distal part of the wound with some undermining distally. He also had hyper granulation. Extensive debridement with a #5 curette. The infected area that was on the lateral part of the fifth met head is closed over. I do not think he needs any more antibiotics. Patient was seen prior to HBO. Preparations for a total contact cast were made in the cast will be placed post hyperbarics 04/11/19; once again the patient arrives today without complaint. He had been in a cast all week noted that he had heavy drainage this week. This resulted in large raised areas of macerated tissue around the wound 1/14; wound bed looks better slightly smaller. Hydrofera Blue has been changing himself. He had a heavy drainage last week which caused a lot of maceration around the wound so I took him out of a total contact cast he says the drainage is actually better this week He is seen today  in conjunction with HBO 1/21; returns to clinic. He was up in [place] for a day or 2 attending a funeral. He comes back in with the wound larger and with a large area of exposed bone. He had osteomyelitis and septic arthritis of the fifth left metatarsal head while he was in hospital. He received IV antibiotics in the hospital for a prolonged period of time then 3 weeks of Augmentin. Subsequently I gave him 2 weeks of doxycycline for more superficial wound infection. When I saw this last week the wound was smaller the surface of the wound looks satisfactory. 1/28; patient missed hyperbarics today. Bone biopsy I did last time showed Enterococcus faecalis and Staphylococcus lugdunensis . He has a wide area of exposed bone. We are going to use silver alginate as of  today. I had another ethical discussion with the patient. 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 In this situation I think the likelihood of healing this is low. Therefore I have recommended a ray amputation and with the patient's agreement I have referred him to Dr. Doran Durand. The other issue is that his compliance with hyperbarics has been minimal because of his work schedule and given his underlying decision I am going to stop this today READMISSION 10/24/2019 MRI 09/29/2019 left foot IMPRESSION: 1. Apparent skin ulceration inferior and lateral to the 5th metatarsal base with underlying heterogeneous T2 signal and enhancement in the subcutaneous fat. Small peripherally enhancing fluid collections along the plantar and lateral aspects of the 5th metatarsal base suspicious for abscesses. 2. Interval amputation through the mid 5th metatarsal with nonspecific low-level marrow edema and enhancement. Given the proximity to the adjacent soft tissue inflammatory changes, osteomyelitis cannot be excluded. 3. The additional bones appear unremarkable. MRI 09/29/2019 right foot IMPRESSION: 1. Soft tissue ulceration lateral to the 5th MTP joint. There is low-level T2 hyperintensity within the 4th and 5th metatarsal heads and adjacent proximal phalanges without abnormal T1 signal or cortical destruction. These findings are nonspecific and could be seen with early marrow edema, hyperemia or early osteomyelitis. No evidence of septic joint. 2. Mild tenosynovitis and synovial enhancement associated with the extensor digitorum tendons at the level of the midfoot. 3. Diffuse low-level muscular T2 hyperintensity and enhancement, most consistent with diabetic myopathy. LEFT FOOT BONE Methicillin resistant staphylococcus aureus Staphylococcus lugdunensis MIC MIC CIPROFLOXACIN >=8 RESISTANT Resistant <=0.5 SENSI... Sensitive CLINDAMYCIN <=0.25 SENS... Sensitive >=8  RESISTANT Resistant ERYTHROMYCIN >=8 RESISTANT Resistant >=8 RESISTANT Resistant GENTAMICIN <=0.5 SENSI... Sensitive <=0.5 SENSI... Sensitive Inducible Clindamycin NEGATIVE Sensitive NEGATIVE Sensitive OXACILLIN >=4 RESISTANT Resistant 2 SENSITIVE Sensitive RIFAMPIN <=0.5 SENSI... Sensitive <=0.5 SENSI... Sensitive TETRACYCLINE <=1 SENSITIVE Sensitive <=1 SENSITIVE Sensitive TRIMETH/SULFA <=10 SENSIT Sensitive <=10 SENSIT Sensitive ... Marland Kitchen.. VANCOMYCIN 1 SENSITIVE Sensitive <=0.5 SENSI... Sensitive Right foot bone . Component 3 wk ago Specimen Description BONE Special Requests RIGHT 4 METATARSAL SAMPLE B Gram Stain NO WBC SEEN NO ORGANISMS SEEN Culture RARE METHICILLIN RESISTANT STAPHYLOCOCCUS AUREUS NO ANAEROBES ISOLATED Performed at [facility], [place] [address], [place], [place] [address] Report Status 10/08/2019 FINAL Organism ID, Bacteria METHICILLIN RESISTANT STAPHYLOCOCCUS AUREUS Resulting Agency [facility] Susceptibility Methicillin resistant staphylococcus aureus MIC CIPROFLOXACIN >=8 RESISTANT Resistant CLINDAMYCIN <=0.25 SENS... Sensitive ERYTHROMYCIN >=8 RESISTANT Resistant GENTAMICIN <=0.5 SENSI... Sensitive Inducible Clindamycin NEGATIVE Sensitive OXACILLIN >=4 RESISTANT Resistant RIFAMPIN <=0.5 SENSI... Sensitive TETRACYCLINE <=1 SENSITIVE Sensitive TRIMETH/SULFA <=10 SENSIT Sensitive ... VANCOMYCIN 1 SENSITIVE Sensitive This is a patient we had in clinic earlier this year with a wound  over his left fifth metatarsal head. 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 Doran Durand on 05/16/2019. He seems to have developed problems with wounds on his bilateral feet in June 2021 although he  may have had problems earlier than this. He was in an urgent care with a right foot ulcer on 09/26/2019 and given a course of doxycycline. This was apparently after having trouble getting into see orthopedics. He was seen by podiatry on 09/28/2019 noted to have bilateral lower extremity ulcers including the left lateral fifth metatarsal base and the right subfifth met head. It was noted that had purulent drainage at that time. He required hospitalization from 6/20 through 7/2. This was because of worsening right foot wounds. He underwent bilateral operative incision and drainage and bone biopsies bilaterally. Culture results are listed above. He has been referred back to clinic by Dr. Jacqualyn Posey of podiatry. He is also followed by Dr. Megan Salon who saw him yesterday. He was discharged from hospital on Zyvox Flagyl and Levaquin and yesterday changed to doxycycline Flagyl and Levaquin. His inflammatory markers on 6/26 showed a sedimentation rate of 129 and a C-reactive protein of 5. This is improved to 14 and 1.3 respectively. This would indicate improvement. ABIs in our clinic today were 1.23 on the right and 1.20 on the left 11/01/2019 on evaluation today patient appears to be doing fairly well in regard to the wounds on his feet at this point. Fortunately there is no signs of active infection at this time. No fevers, chills, nausea, vomiting, or diarrhea. He currently is seeing infectious disease and still under their care at this point. Subsequently he also has both wounds which she has not been using collagen on as he did not receive that in his packaging he did not call us and let us know that. Apparently that just was missed on the order. Nonetheless we will get that straightened out today. 8/9-Patient returns for bilateral foot wounds, using Prisma with hydrogel moistened dressings, and the wounds appear stable. Patient using surgical shoes, avoiding much pressure or weightbearing as much as  possible 8/16; patient has bilateral foot wounds. 1 on the right lateral foot proximally the other is on the left mid lateral foot. Both required debridement of callus and thick skin around the wounds. We have been using silver collagen 8/27; patient has bilateral lateral foot wounds. The area on the left substantially surrounded by callus and dry skin. This was removed from the wound edge. The underlying wound is small. The area on the right measured somewhat smaller today. 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 Unfortunately I did not update his antibiotics during today's visit. 9/10 I reviewed Dr. Hale Bogus last notes he felt he had completed antibiotics his inflammatory markers were reasonably well controlled. He has a small wound on the lateral left foot and a tiny area on the right which is just above closed. He is using Hydrofera Blue with border foam he has bilateral surgical shoes 9/24; 2 week f/u. doing well. right foot is closed. left foot still undermined. 10/14; right foot remains closed at the fifth met head. The area over the base of the left fifth metatarsal has a small open  area but considerable undermining towards the plantar foot. Thick callus skin around this suggests an adequate pressure relief. We have talked about this. He says he is going to go back into his cam boot. I suggested a total contact cast he did not seem enamored with this suggestion 10/26; left foot base of the fifth metatarsal. Same condition as last time. He has skin over the area with an open wound however the skin is not adherent. He went to see Dr. Earleen Newport who did an x-ray and culture of his foot I have not reviewed the x-ray but the patient was not told anything. He is on doxycycline 11/11; since the patient was last here he was in the emergency room on 10/30 he was concerned about swelling in the left foot. They did not do any cultures  or x-rays. They changed his antibiotics to cephalexin. Previous culture showed group B strep. The cephalexin is appropriate as doxycycline has less than predictable coverage. Arrives in clinic today with swelling over this area under the wound. He also has a new wound on the right fifth metatarsal head 11/18; the patient has a difficult wound on the lateral aspect of the left fifth metatarsal head. The wound was almost ballotable last week I opened it slightly expecting to see purulence however there was just bleeding. I cultured this this was negative. X-ray unchanged. We are trying to get an MRI but I am not sure were going to be able to get this through his insurance. He also has an area on the right lateral fifth metatarsal head this looks healthier 12/3; the patient finally got our MRI. Surprisingly this did not show osteomyelitis. I did show the soft tissue ulceration at the lateral plantar aspect of the fifth metatarsal base with a tiny residual 6 mm abscess overlying the superficial fascia I have tried to culture this area I have not been able to get this to grow anything. Nevertheless the protruding tissue looks aggravated. I suspect we should try to treat the underlying "abscess with broad-spectrum antibiotics. I am going to start him on Levaquin and Flagyl. He has much less edema in his legs and I am going to continue to wrap his legs and see him weekly 12/10. I started Levaquin and Flagyl on him last week. He just picked up the Flagyl apparently there was some delay. The worry is the wound on the left fifth metatarsal base which is substantial and worsening. His foot looks like he inverts at the ankle making this a weightbearing surface. Certainly no improvement in fact I think the measurements of this are somewhat worse. We have been using 12/17; he apparently just got the Levaquin yesterday this is 2 weeks after the fact. He has completed the Flagyl. The area over the left fifth  metatarsal base still has protruding granulation tissue although it does not look quite as bad as it did some weeks ago. He has severe bilateral lymphedema although we have not been treating him for wounds on his legs this is definitely going to require compression. There was so much edema in the left I did not wish to put him in a total contact cast today. I am going to increase his compression from 3-4 layer. The area on the right lateral fifth met head actually look quite good and superficial. 12/23; patient arrived with callus on the right fifth met head and the substantial hyper granulated callused wound on the base of his fifth metatarsal. He says he is completing  his Levaquin in 2 days but I do not think that adds up with what I gave him but I will have to double check this. We are using Hydrofera Blue on both areas. My plan is to put the left leg in a cast the week after New Year's 04/06/2020; patient's wounds about the same. Right lateral fifth metatarsal head and left lateral foot over the base of the fifth metatarsal. There is undermining on the left lateral foot which I removed before application of total contact cast continuing with Hydrofera Blue new. Patient tells me he was seen by endocrinology today lab work was done [Dr. Kerr]. Also wondering whether he was referred to cardiology. I went over some lab work from previously does not have chronic renal failure certainly not nephrotic range proteinuria he does have very poorly controlled diabetes but this is not his most updated lab work. Hemoglobin A1c has been over 11 1/10; the patient had a considerable amount of leakage towards mid part of his left foot with macerated skin however the wound surface looks better the area on the right lateral fifth met head is better as well. I am going to change the dressing on the left foot under the total contact cast to silver alginate, continue with Hydrofera Blue on the right. 1/20; patient was in  the total contact cast for 10 days. Considerable amount of drainage although the skin around the wound does not look too bad on the left foot. The area on the right fifth metatarsal head is closed. Our nursing staff reports large amount of drainage out of the left lateral foot wound 1/25; continues with copious amounts of drainage described by our intake staff. PCR culture I did last week showed E. coli and Enterococcus faecalis and low quantities. Multiple resistance genes documented including extended spectrum beta lactamase, MRSA, MRSE, quinolone, tetracycline. The wound is not quite as good this week as it was 5 days ago but about the same size 2/3; continues with copious amounts of malodorous drainage per our intake nurse. The PCR culture I did 2 weeks ago showed E. coli and low quantities of Enterococcus. There were multiple resistance genes detected. I put Neosporin on him last week although this does not seem to have helped. The wound is slightly deeper today. Offloading continues to be an issue here although with the amount of drainage she has a total contact cast is just not going to work 2/10; moderate amount of drainage. Patient reports he cannot get his stocking on over the dressing. I told him we have to do that the nurse gave him suggestions on how to make this work. The wound is on the bottom and lateral part of his left foot. Is cultured predominantly grew low amounts of Enterococcus, E. coli and anaerobes. There were multiple resistance genes detected including extended spectrum beta lactamase, quinolone, tetracycline. I could not think of an easy oral combination to address this so for now I am going to do topical antibiotics provided by [facility] I think the main agents here are vancomycin and an aminoglycoside. We have to be able to give him access to the wounds to get the topical antibiotic on 2/17; moderate amount of drainage this is unchanged. He has his Keystone topical  antibiotic against the deep tissue culture organisms. He has been using this and changing the dressing daily. Silver alginate on the wound surface. 2/24; using Keystone antibiotic with silver alginate on the top. He had too much drainage for a total  contact cast at one point although I think that is improving and I think in the next week or 2 it might be possible to replace a total contact cast I did not do this today. In general the wound surface looks healthy however he continues to have thick rims of skin and subcutaneous tissue around the wide area of the circumference which I debrided 06/04/2020 upon evaluation today patient appears to be doing well in regard to his wound. I do feel like he is showing signs of improvement. There is little bit of callus and dead tissue around the edges of the wound as well as what appears to be a little bit of a sinus tract that is off to the side laterally I would perform debridement to clear that away today. 3/17; left lateral foot. The wound looks about the same as I remember. Not much depth surface looks healthy. No evidence of infection 3/25; left lateral foot. Wound surface looks about the same. Separating epithelium from the circumference. There really is no evidence of infection here however not making progress by my view 3/29; left lateral foot. Surface of the wound again looks reasonably healthy still thick skin and subcutaneous tissue around the wound margins. There is no evidence of infection. One of the concerns being brought up by the nurses has again the amount of drainage vis--vis continued use of a total contact cast 4/5; left lateral foot at roughly the base of the fifth metatarsal. Nice healthy looking granulated tissue with rims of epithelialization. The overall wound measurements are not any better but the tissue looks healthy. The only concern is the amount of drainage although he has no surrounding maceration with what we have been doing  recently to absorb fluid and protect his skin. He also has lymphedema. 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 He states that the kerlix/coban under the TCC rolled down and could not get it back up. He has been using an offloading scooter and has somehow hurt his right foot using this device. This happened last week. He states that the side of his right foot developed a blister and opened. The top of his foot also has a few small open wounds he thinks is due to his socks rubbing in his shoes. He has not been using any dressings to the wound. He denies purulent drainage, fever/chills or erythema to the wounds. 4/22; patient presents for 1 week follow-up. He developed new wounds to the right foot that were evaluated at last clinic visit. He continues to have a total contact cast to the left leg and he reports no issues. He has been using silver collagen to the right foot wounds with no issues. He denies purulent drainage, fever/chills or erythema to the right foot wounds. He has no complaints today 4/25; patient presents for 1 week follow-up. He has a total contact cast of the left leg and reports no issues. He has been using silver alginate to the right foot wound. He denies purulent drainage, fever/chills or erythema to the right foot wounds. 5/2 patient presents for 1 week follow-up. T contact cast on the left. The wound which is on the base of the plantar foot at the base of the fifth metatarsal  otal actually looks quite good and dimensions continue to gradually contract. HOWEVER the area on the right lateral fifth metatarsal head is much larger than what I remember from 2 weeks ago. Once more is he has significant levels of hypergranulation.  Noteworthy that he had this same hyper granulated response on his wound on the left foot at one point in time. So much so that he I thought there was an underlying fluid collection. Based on this I think this just needs debridement. 5/9; the wound on the left actually continues to be gradually smaller with a healthy surface. Slight amount of drainage and maceration of the skin around but not too bad. However he has a large wound over the right fifth metatarsal head very much in the same configuration as his left foot wound was initially. I used silver nitrate to address the hyper granulated tissue no mechanical debridement 5/16; area on the left foot did not look as healthy this week deeper thick surrounding macerated skin and subcutaneous tissue. oo The area on the right foot fifth met head was about the same oo The area on the right ankle that we identified last week is completely broken down into an open wound presumably a stocking rubbing issue 5/23; patient has been using a total contact cast to the left side. He has been using silver alginate underneath. He has also been using silver alginate to the right foot wounds. He has no complaints today. He denies any signs of infection. 5/31; the left-sided wound looks some better measure smaller surface granulation looks better. We have been using silver alginate under the total contact cast oo The large area on his right fifth met head and right dorsal foot look about the same still using silver alginate 6/6; neither side is good as I was hoping although the surface area dimensions are better. A lot of maceration on his left and right foot around the wound edge. Area on the dorsal right foot looks better. He says he was traveling. I am not sure what does the amount of maceration around the plantar wounds may be drainage issues 6/13; in general the wound surfaces look quite good on both sides. Macerated skin and raised edges around the wound  required debridement although in general especially on the left the surface area seems improved. oo The area on the right dorsal ankle is about the same I thought this would not be such a problem to close 6/20; not much change in either wound although the one on the right looks a little better. Both wounds have thick macerated edges to the skin requiring debridements. We have been using silver alginate. The area on the dorsal right ankle is still open I thought this would be closed. 6/28; patient comes in today with a marked deterioration in the right foot wound fifth met head. Wide area of exposed bone this is a drastic change from last time. The area on the left there we have been casting is stagnant. We have been using silver alginate in both wound areas. Objective Constitutional Patient is hypertensive.. Pulse regular and within target range for patient.Marland Kitchen Respirations regular, non-labored and within target range.. Temperature is normal and within the target range for the patient.Marland Kitchen Appears in no distress. Vitals Time Taken: 4:12 PM, Height: 77 in, Weight: 280 lbs, BMI: 33.2, Temperature: 99.0 F, Pulse: 104 bpm, Respiratory Rate: 18 breaths/min, Blood Pressure: 159/92 mmHg, Capillary Blood Glucose: 120 mg/dl. General Notes: Wound exam; again macerated tissue around the wound  with thick callus on the left. I used pickups and a #15 scalpel to remove this and attempt to get an edge to epithelialize this area which we have not been able to get the date. Hemostasis with silver nitrate and a pressure dressing. oo On the right he looked healthy last week this week he is a wide area of exposed bone. I used a #5 curette to do a bone shaving and obtain a specimen for culture. He will also need an x-ray of this area Integumentary (Hair, Skin) Wound #3 status is Open. Original cause of wound was Trauma. The date acquired was: 10/02/2019. The wound has been in treatment 48 weeks. The wound is located on  the Left,Lateral Foot. The wound measures 2cm length x 2cm width x 0.5cm depth; 3.142cm^2 area and 1.571cm^3 volume. There is Fat Layer (Subcutaneous Tissue) exposed. There is no tunneling or undermining noted. There is a large amount of serosanguineous drainage noted. The wound margin is thickened. There is large (67-100%) red granulation within the wound bed. There is no necrotic tissue within the wound bed. Wound #5 status is Open. Original cause of wound was Gradually Appeared. The date acquired was: 07/07/2020. The wound has been in treatment 11 weeks. The wound is located on the Right,Lateral Metatarsal head fifth. The wound measures 2.4cm length x 2.5cm width x 0.8cm depth; 4.712cm^2 area and 3.77cm^3 volume. There is Fat Layer (Subcutaneous Tissue) exposed. There is no tunneling or undermining noted. There is a medium amount of serosanguineous drainage noted. The wound margin is distinct with the outline attached to the wound base. There is large (67-100%) red granulation within the wound bed. There is no necrotic tissue within the wound bed. Wound #7 status is Open. Original cause of wound was Gradually Appeared. The date acquired was: 08/17/2020. The wound has been in treatment 6 weeks. The wound is located on the Right,Anterior Ankle. The wound measures 1cm length x 0.6cm width x 0.1cm depth; 0.471cm^2 area and 0.047cm^3 volume. There is Fat Layer (Subcutaneous Tissue) exposed. There is no tunneling or undermining noted. There is a medium amount of serosanguineous drainage noted. The wound margin is distinct with the outline attached to the wound base. There is large (67-100%) red granulation within the wound bed. There is a small (1-33%) amount of necrotic tissue within the wound bed including Adherent Slough. 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 There was a Excisional Skin/Subcutaneous Tissue Debridement with a total area of 4 sq cm performed by [name]., MD. With the following instrument(s): Curette to remove Viable and Non-Viable tissue/material. Material removed includes Callus, Subcutaneous Tissue, Slough, Skin: Dermis, and Skin: Epidermis after achieving pain control using Lidocaine. No specimens were taken. A time out was conducted at 16:55, prior to the start of the procedure. A Minimum amount of bleeding was controlled with Pressure. The procedure was tolerated well with a pain level of 0 throughout and a pain level of 0 following the procedure. Post Debridement Measurements: 2cm length x 2cm width x 0.5cm depth; 1.571cm^3 volume. Character of Wound/Ulcer Post Debridement is improved.  Severity of Tissue Post Debridement is: Fat layer exposed. Post procedure Diagnosis Wound #3: Same as Pre-Procedure Pre-procedure diagnosis of Wound #3 is a Diabetic Wound/Ulcer of the Lower Extremity located on the Left,Lateral Foot . There was a T Contact Cast otal Procedure by [name]., MD. Post procedure Diagnosis Wound #3: Same as Pre-Procedure Wound #5 Pre-procedure diagnosis of Wound #5 is a Diabetic Wound/Ulcer of the Lower Extremity located on the Right,Lateral Metatarsal head fifth .Severity of Tissue Pre Debridement is: Bone involvement without necrosis. There was a Excisional Skin/Subcutaneous Tissue/Muscle/Bone Debridement with a total area of 6 sq cm performed by [name]., MD. With the following instrument(s): Curette to remove Viable and Non-Viable tissue/material. Material  removed includes Bone,Callus, Subcutaneous Tissue, Slough, Skin: Dermis, and Skin: Epidermis after achieving pain control using Lidocaine. No specimens were taken. A time out was conducted at 16:55, prior to the start of the procedure. A Minimum amount of bleeding was controlled with Pressure. The procedure was tolerated well with a pain level of 0 throughout and a pain level of 0 following the procedure. Post Debridement Measurements: 2.4cm length x 2.5cm width x 0.8cm depth; 3.77cm^3 volume. Character of Wound/Ulcer Post Debridement is improved. Severity of Tissue Post Debridement is: Bone involvement without necrosis. Post procedure Diagnosis Wound #5: Same as Pre-Procedure Plan Follow-up Appointments: Return Appointment in 1 week. - ***60 minutes for cast*** - with Dr. Arcola Jansky Shower/ Hygiene: May shower with protection but do not get wound dressing(s) wet. - use a cast protector to left leg. Edema Control - Lymphedema / SCD / Other: Elevate legs to the level of the heart or above for 30 minutes daily and/or when sitting, a frequency of: - throughout the day Avoid standing for long periods of time. Exercise regularly Moisturize legs daily. - right leg every night before bed. Compression stocking or Garment 20-30 mm/Hg pressure to: - Apply to right leg in the morning and remove at night. Off-Loading: T Contact Cast to Left Lower Extremity otal Open toe surgical shoe to: - right foot Radiology ordered were: X-ray, foot - right foot WOUND #3: - Foot Wound Laterality: Left, Lateral Cleanser: Soap and Water 1 x Per Week/30 Days Discharge Instructions: May shower and wash wound with dial antibacterial soap and water prior to dressing change. Peri-Wound Care: Zinc Oxide Ointment 30g tube 1 x Per Week/30 Days Discharge Instructions: Apply Zinc Oxide to periwound with each dressing change as needed. Prim Dressing: Hydrofera Blue Classic Foam, 2x2 in 1 x Per Week/30  Days ary Discharge Instructions: Moisten with saline prior to applying to wound bed Secondary Dressing: Woven Gauze Sponge, Non-Sterile 4x4 in 1 x Per Week/30 Days Discharge Instructions: Apply over primary dressing as directed. Secondary Dressing: ABD Pad, 8x10 1 x Per Week/30 Days Discharge Instructions: T down before applying kerlix. Apply over primary dressing as directed. ape Secondary Dressing: Zetuvit Plus 4x8 in 1 x Per Week/30 Days Discharge Instructions: Apply over primary dressing as directed. Secured With: [employer], 4.5x3.1 (in/yd) 1 x Per Week/30 Days Discharge Instructions: Secure with Kerlix as directed. Com pression Wrap: Kerlix Roll 4.5x3.1 (in/yd) 1 x Per Week/30 Days Discharge Instructions: Apply Kerlix and Coban compression under TCC. Com pression Wrap: Coban Self-Adherent Wrap 4x5 (in/yd) 1 x Per Week/30 Days Discharge Instructions: Apply over Kerlix as directed. WOUND #5: - Metatarsal head fifth Wound Laterality: Right, Lateral Cleanser: Normal Saline (Generic) Every Other Day/15 Days Discharge Instructions: Cleanse the wound with Normal Saline prior to applying a  clean dressing using gauze sponges, not tissue or cotton balls. Cleanser: Soap and Water Every Other Day/15 Days Discharge Instructions: May shower and wash wound with dial antibacterial soap and water prior to dressing change. Prim Dressing: KerraCel Ag Gelling Fiber Dressing, 2x2 in (silver alginate) (Generic) Every Other Day/15 Days ary Discharge Instructions: Apply silver alginate to wound bed as instructed Secondary Dressing: ABD Pad, 5x9 (Generic) Every Other Day/15 Days Discharge Instructions: Apply over primary dressing as directed. Secondary Dressing: Optifoam Non-Adhesive Dressing, 4x4 in (Generic) Every Other Day/15 Days Discharge Instructions: Apply over primary dressing, cut to make foam donut to offload Secured With: Kerlix Roll Sterile, 4.5x3.1 (in/yd) (Generic) Every Other Day/15  Days Discharge Instructions: Secure with Kerlix as directed. Secured With: 50M Medipore H Soft Cloth Surgical T ape, 2x2 (in/yd) (Generic) Every Other Day/15 Days Discharge Instructions: Secure dressing with tape as directed. WOUND #7: - Ankle Wound Laterality: Right, Anterior Cleanser: Normal Saline (Generic) Every Other Day/15 Days Discharge Instructions: Cleanse the wound with Normal Saline prior to applying a clean dressing using gauze sponges, not tissue or cotton balls. Cleanser: Soap and Water Every Other Day/15 Days Discharge Instructions: May shower and wash wound with dial antibacterial soap and water prior to dressing change. Prim Dressing: KerraCel Ag Gelling Fiber Dressing, 2x2 in (silver alginate) (Generic) Every Other Day/15 Days ary Discharge Instructions: Apply silver alginate to wound bed as instructed Secondary Dressing: Woven Gauze Sponge, Non-Sterile 4x4 in (Generic) Every Other Day/15 Days Discharge Instructions: Apply over primary dressing as directed. Secondary Dressing: ABD Pad, 5x9 (Generic) Every Other Day/15 Days Discharge Instructions: Apply over primary dressing as directed. Secured With: [employer], 4.5x3.1 (in/yd) (Generic) Every Other Day/15 Days Discharge Instructions: Secure with Kerlix as directed. Secured With: 50M Medipore H Soft Cloth Surgical T ape, 2x2 (in/yd) (Generic) Every Other Day/15 Days Discharge Instructions: Secure dressing with tape as directed. 1. Changed to [facility] on the left under a total contact cast 2. Bone specimen for PCR culture from the right still using silver alginate 3. X-ray of the right foot 4. Very disappointing deterioration on the right this week Electronic Signature(s) Signed: 09/29/2020 5:29:17 PM By: Max Ham MD Entered By: [name] on 09/29/2020 17:21:09 -------------------------------------------------------------------------------- Total Contact Cast Details Patient Name: Date of  Service: [name], [name] 09/29/2020 3:00 PM Medical Record Number: [id number] Patient Account Number: [id number] Date of Birth/Sex: Treating RN: 1985-07-12 (35 y.o. [name] Primary Care Provider: Seward [name] Other Clinician: Referring Provider: Treating Provider/Extender: [name] in Treatment: 48 T Contact Cast Applied for Wound Assessment: otal Wound #3 Left,Lateral Foot Performed By: Physician [name]., MD Post Procedure Diagnosis Same as Pre-procedure Electronic Signature(s) Signed: 09/29/2020 5:29:17 PM By: Max Ham MD Entered By: [name] on 09/29/2020 17:18:23 -------------------------------------------------------------------------------- SuperBill Details Patient Name: Date of Service: [name], [name] 09/29/2020 Medical Record Number: [id number] Patient Account Number: [id number] Date of Birth/Sex: Treating RN: [birth year] (35 y.o. 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 Facility Procedures CPT4 Code: [id number] Description: 67341 - DEB SUBQ TISSUE 20 SQ CM/< ICD-10  Diagnosis Description L97.528 Non-pressure chronic ulcer of other part of left foot with other specified seve Modifier: 59 rity Quantity: 1 Physician Procedures CPT4: Description Modifier Code [id number] - WC PHYS SUBQ TISS 20 SQ CM 28 ICD-10 Diagnosis Description L97.528 Non-pressure chronic ulcer of other part of left foot with other specified  severity Quantity: 1 CPT4: 1102111 Debridement; bone (includes epidermis, dermis, subQ tissue, muscle and/or fascia, if performed) 1st 20 sqcm or less ICD-10 Diagnosis Description L97.518 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ity Quantity: 1 Electronic Signature(s) Signed: 09/29/2020 5:29:17 PM By: Max Ham MD Entered By: [name] on 09/29/2020 17:21:38

## 2020-09-30 ENCOUNTER — Ambulatory Visit (HOSPITAL_COMMUNITY)
Admission: RE | Admit: 2020-09-30 | Discharge: 2020-09-30 | Disposition: A | Discharge status: Home / Self Care | Payer: BC Managed Care – PPO | Source: Ambulatory Visit | Attending: Internal Medicine | Admitting: Internal Medicine

## 2020-09-30 ENCOUNTER — Other Ambulatory Visit (HOSPITAL_COMMUNITY): Payer: Self-pay | Admitting: Internal Medicine

## 2020-09-30 DIAGNOSIS — L97518 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ity: Secondary | ICD-10-CM | POA: Diagnosis not present

## 2020-09-30 DIAGNOSIS — E11621 Type 2 diabetes mellitus with foot ulcer: Secondary | ICD-10-CM | POA: Diagnosis present

## 2020-09-30 DIAGNOSIS — M869 Osteomyelitis, unspecified: Secondary | ICD-10-CM | POA: Diagnosis present

## 2020-09-30 DIAGNOSIS — L97509 Non-pressure chronic ulcer of other part of unspecified foot with unspecified severity: Secondary | ICD-10-CM | POA: Insufficient documentation

## 2020-09-30 DIAGNOSIS — E1169 Type 2 diabetes mellitus with other specified complication: Secondary | ICD-10-CM | POA: Insufficient documentation

## 2020-10-06 ENCOUNTER — Encounter (HOSPITAL_BASED_OUTPATIENT_CLINIC_OR_DEPARTMENT_OTHER): Payer: BC Managed Care – PPO | Attending: Internal Medicine | Admitting: Internal Medicine

## 2020-10-06 ENCOUNTER — Other Ambulatory Visit: Payer: Self-pay

## 2020-10-06 DIAGNOSIS — L97319 Non-pressure chronic ulcer of right ankle with unspecified severity: Secondary | ICD-10-CM | POA: Diagnosis not present

## 2020-10-06 DIAGNOSIS — L97529 Non-pressure chronic ulcer of other part of left foot with unspecified severity: Secondary | ICD-10-CM | POA: Diagnosis not present

## 2020-10-06 DIAGNOSIS — L97519 Non-pressure chronic ulcer of other part of right foot with unspecified severity: Secondary | ICD-10-CM | POA: Diagnosis not present

## 2020-10-06 DIAGNOSIS — E11621 Type 2 diabetes mellitus with foot ulcer: Secondary | ICD-10-CM | POA: Diagnosis present

## 2020-10-07 NOTE — Progress Notes (Signed)
[name], [name] ([id number]) Visit Report for 10/06/2020 Arrival Information Details Patient Name: Date of Service: [name] 10/06/2020 3:00 PM Medical Record Number: [id number] Patient Account Number: [id number] Date of Birth/Sex: Treating RN: [birth year] (35 y.o. [name] Primary Care Wells Mabe: [name] Other Clinician: Referring Zya Finkle: Treating Lataunya Ruud/Extender: [name] in Treatment: 79 Visit Information History Since Last Visit Added or deleted any medications: No Patient Arrived: Ambulatory Any new allergies or adverse reactions: No Arrival Time: 16:31 Had a fall or experienced change in No Accompanied By: alone activities of daily living that may affect Transfer Assistance: None risk of falls: Patient Identification Verified: Yes Signs or symptoms of abuse/neglect since last visito No Secondary Verification Process Completed: Yes Hospitalized since last visit: No Patient Requires Transmission-Based Precautions: No Implantable device outside of the clinic excluding No Patient Has Alerts: No cellular tissue based products placed in the center since last visit: Has Dressing in Place as Prescribed: Yes Has Footwear/Offloading in Place as Prescribed: Yes Left: T Contact Cast otal Pain Present Now: No Electronic Signature(s) Signed: 10/06/2020 7:10:28 PM By: [name] RN, BSN Entered By: [name] on 10/06/2020 16:46:19 -------------------------------------------------------------------------------- Encounter Discharge Information Details Patient Name: Date of Service: [name], [name] 10/06/2020 3:00 PM Medical Record Number: [id number] Patient Account Number: [id number] Date of Birth/Sex: Treating RN: June 25, 1985 (35 y.o. [name] Primary Care Cameron Schwinn: [name] Other Clinician: Referring Azaiah Licciardi: Treating Lydie Stammen/Extender: [name] in Treatment: 4 Encounter  Discharge Information Items Post Procedure Vitals Discharge Condition: Stable Temperature (F): 99.8 Ambulatory Status: Ambulatory Pulse (bpm): 102 Discharge Destination: Home Respiratory Rate (breaths/min): 18 Transportation: Private Auto Blood Pressure (mmHg): 123/85 Accompanied By: alone Schedule Follow-up Appointment: No Clinical Summary of Care: Patient Declined Electronic Signature(s) Signed: 10/06/2020 6:17:43 PM By: Leane Call Entered By: Leane Call on 10/06/2020 18:01:40 -------------------------------------------------------------------------------- Lower Extremity Assessment Details Patient Name: Date of Service: [name] 10/06/2020 3:00 PM Medical Record Number: [id number] Patient Account Number: [id number] Date of Birth/Sex: Treating RN: 25-Jun-1985 (35 y.o. [name] Primary Care Ashayla Subia: [name] Other Clinician: Referring Desarai Barrack: Treating Talton Delpriore/Extender: [name] in Treatment: 49 Edema Assessment Assessed: [name]: No] [name]: No] Edema: [Left: Yes] [Right: Yes] Calf Left: Right: Point of Measurement: 48 cm From Medial Instep 48 cm 47 cm Ankle Left: Right: Point of Measurement: 11 cm From Medial Instep 31 cm 30 cm Vascular Assessment Pulses: Dorsalis Pedis Palpable: [Left:Yes] [Right:Yes] Electronic Signature(s) Signed: 10/06/2020 7:10:28 PM By: [name] RN, BSN Entered By: [name] on 10/06/2020 16:47:11 -------------------------------------------------------------------------------- Multi Wound Chart Details Patient Name: Date of Service: [name], [name] 10/06/2020 3:00 PM Medical Record Number: [id number] Patient Account Number: [id number] Date of Birth/Sex: Treating RN: Dec 21, 1985 (35 y.o. [name] Primary Care Mayara Paulson: [name] Other Clinician: Referring Nikira Kushnir: Treating Shallon Yaklin/Extender: [name] in Treatment: 49 Vital  Signs Height(in): 77 Capillary Blood Glucose(mg/dl): 148 Weight(lbs): 280 Pulse(bpm): 63 Body Mass Index(BMI): 33 Blood Pressure(mmHg): 123/85 Temperature(F): 99.8 Respiratory Rate(breaths/min): 18 Photos: [3:Left, Lateral Foot] [5:Right, Lateral Metatarsal head fifth Right, Anterior Ankle] Wound Location: [3:Trauma] [5:Gradually Appeared] [7:Gradually Appeared] Wounding Event: [3:Diabetic Wound/Ulcer of the LowerDiabetic Wound/Ulcer of the Lower] [7:Diabetic Wound/Ulcer of the Lower] Primary Etiology: [3:Extremity Type II Diabetes] [5:Extremity Type II Diabetes] [7:Extremity Type II Diabetes] Comorbid History: [3:10/02/2019] [5:07/07/2020] [7:08/17/2020] Date Acquired: [3:49] [5:12] [7:7] Weeks of Treatment: [3:Open] [5:Open] [7:Open] Wound Status: [3:2.5x2.5x0.2] [5:2x2.5x1.3] [7:0.8x0.3x0.1] Measurements L x W  x [name] [3:4.909] [5:3.927] [7:0.188] A (cm) : rea [3:0.982] [5:5.105] [7:0.019] Volume (cm) : [3:-197.70%] [5:55.40%] [7:91.70%] % Reduction in A [3:rea: -495.20%] [5:-16.10%] [7:91.60%] % Reduction in Volume: [5:6] Starting Position 1 (o'clock): [5:12] Ending Position 1 (o'clock): [5:1.5] Maximum Distance 1 (cm): [3:No] [5:Yes] [7:No] Undermining: [3:Grade 2] [5:Grade 2] [7:Grade 2] Classification: [3:Large] [5:Medium] [7:Medium] Exudate A mount: [3:Serosanguineous] [5:Serosanguineous] [7:Serosanguineous] Exudate Type: [3:red, brown] [5:red, brown] [7:red, brown] Exudate Color: [3:Thickened] [5:Distinct, outline attached] [7:Distinct, outline attached] Wound Margin: [3:Large (67-100%)] [5:Medium (34-66%)] [7:Large (67-100%)] Granulation A mount: [3:Pink] [5:Pink] [7:Red] Granulation Quality: [3:Small (1-33%)] [5:Medium (34-66%)] [7:Small (1-33%)] Necrotic A mount: [3:Fat Layer (Subcutaneous Tissue): Yes Fat Layer (Subcutaneous Tissue): Yes Fat Layer (Subcutaneous Tissue): Yes] Exposed Structures: [3:Fascia: No Tendon: No Muscle: No Joint: No Bone: No Small (1-33%)]  [5:Bone: Yes Fascia: No Tendon: No Muscle: No Joint: No Small (1-33%)] [7:Fascia: No Tendon: No Muscle: No Joint: No Bone: No Large (67-100%)] Epithelialization: [3:Debridement - Excisional] [5:N/A] [7:N/A] Debridement: Pre-procedure Verification/Time Out 17:24 [5:N/A] [7:N/A] Taken: [3:Other] [5:N/A] [7:N/A] Pain Control: [3:Subcutaneous] [5:N/A] [7:N/A] Tissue Debrided: [3:Skin/Subcutaneous Tissue] [5:N/A] [7:N/A] Level: [3:6.25] [5:N/A] [7:N/A] Debridement A (sq cm): [3:rea Curette] [5:N/A] [7:N/A] Instrument: [3:Moderate] [5:N/A] [7:N/A] Bleeding: [3:Pressure] [5:N/A] [7:N/A] Hemostasis A chieved: [3:Procedure was tolerated well] [5:N/A] [7:N/A] Debridement Treatment Response: [3:2.5x2.5x0.2] [5:N/A] [7:N/A] Post Debridement Measurements L x W x [name] [3:0.982] [5:N/A] [7:N/A] Post Debridement Volume: (cm) [3:Debridement] [5:N/A] [7:N/A] Procedures Performed: [3:T Contact Cast otal] Treatment Notes Electronic Signature(s) Signed: 10/06/2020 7:08:24 PM By: [name] Signed: 10/07/2020 1:36:13 PM By: [name] MD Entered By: [name] on 10/06/2020 17:53:51 -------------------------------------------------------------------------------- Multi-Disciplinary Care Plan Details Patient Name: Date of Service: [name], [name] 10/06/2020 3:00 PM Medical Record Number: [id number] Patient Account Number: [id number] Date of Birth/Sex: Treating RN: [birth year] (35 y.o. [name] Primary Care Laisha Rau: [name] Other Clinician: Referring Ngoc Detjen: Treating Hayden Mabin/Extender: [name] in Treatment: 75 Multidisciplinary Care Plan reviewed with physician Active Inactive Nutrition Nursing Diagnoses: Imbalanced nutrition Potential for alteratiion in Nutrition/Potential for imbalanced nutrition Goals: Patient/caregiver agrees to and verbalizes understanding of need to use nutritional supplements and/or vitamins as prescribed Date  Initiated: 10/24/2019 Date Inactivated: 04/06/2020 Target Resolution Date: 04/03/2020 Goal Status: Met Patient/caregiver will maintain therapeutic glucose control Date Initiated: 10/24/2019 Target Resolution Date: 10/16/2020 Goal Status: Active Interventions: Assess HgA1c results as ordered upon admission and as needed Assess patient nutrition upon admission and as needed per policy Provide education on elevated blood sugars and impact on wound healing Provide education on nutrition Treatment Activities: Education provided on Nutrition : 09/29/2020 Notes: Wound/Skin Impairment Nursing Diagnoses: Impaired tissue integrity Knowledge deficit related to ulceration/compromised skin integrity Goals: Patient/caregiver will verbalize understanding of skin care regimen Date Initiated: 10/24/2019 Target Resolution Date: 10/16/2020 Goal Status: Active Ulcer/skin breakdown will have a volume reduction of 30% by week 4 Date Initiated: 10/24/2019 Date Inactivated: 01/16/2020 Target Resolution Date: 01/10/2020 Unmet Reason: no change in Goal Status: Unmet measurements. Interventions: Assess patient/caregiver ability to obtain necessary supplies Assess patient/caregiver ability to perform ulcer/skin care regimen upon admission and as needed Assess ulceration(s) every visit Provide education on ulcer and skin care Notes: Electronic Signature(s) Signed: 10/06/2020 7:08:24 PM By: [name] Entered By: [name] on 10/06/2020 17:26:23 -------------------------------------------------------------------------------- Pain Assessment Details Patient Name: Date of Service: [name], [name] 10/06/2020 3:00 PM Medical Record Number: [id number] Patient Account Number: [id number] Date of Birth/Sex: Treating RN: 1985/12/10 (35 y.o. [name] Primary Care Naliah Eddington: [name] Other  Clinician: Referring Mate Alegria: Treating Heidi Lemay/Extender: [name] in  Treatment: 49 Active Problems Location of Pain Severity and Description of Pain Patient Has Paino No Site Locations Pain Management and Medication Current Pain Management: Electronic Signature(s) Signed: 10/06/2020 7:10:28 PM By: [name] RN, BSN Entered By: [name] on 10/06/2020 16:46:48 -------------------------------------------------------------------------------- Patient/Caregiver Education Details Patient Name: Date of Service: [name] 7/5/2022andnbsp3:00 PM Medical Record Number: [id number] Patient Account Number: [id number] Date of Birth/Gender: Treating RN: 1985-07-03 (35 y.o. [name] Primary Care Physician: [name] Other Clinician: Referring Physician: Treating Physician/Extender: [name] in Treatment: 29 Education Assessment Education Provided To: Patient Education Topics Provided Elevated Blood Sugar/ Impact on Healing: Methods: Explain/Verbal, Printed Responses: State content correctly Offloading: Methods: Explain/Verbal, Printed Responses: State content correctly Wound/Skin Impairment: Methods: Explain/Verbal, Printed Responses: State content correctly Electronic Signature(s) Signed: 10/06/2020 7:08:24 PM By: [name] Entered By: [name] on 10/06/2020 17:27:02 -------------------------------------------------------------------------------- Wound Assessment Details Patient Name: Date of Service: [name], [name] 10/06/2020 3:00 PM Medical Record Number: [id number] Patient Account Number: [id number] Date of Birth/Sex: Treating RN: 1985/04/30 (35 y.o. [name] Primary Care Dealie Koelzer: [name] Other Clinician: Referring Davyd Podgorski: Treating Jazlyne Gauger/Extender: [name] in Treatment: 49 Wound Status Wound Number: 3 Primary Etiology: Diabetic Wound/Ulcer of the Lower Extremity Wound Location: Left, Lateral Foot Wound Status:  Open Wounding Event: Trauma Comorbid History: Type II Diabetes Date Acquired: 10/02/2019 Weeks Of Treatment: 49 Clustered Wound: No Photos Wound Measurements Length: (cm) 2.5 Width: (cm) 2.5 Depth: (cm) 0.2 Area: (cm) 4.909 Volume: (cm) 0.982 % Reduction in Area: -197.7% % Reduction in Volume: -495.2% Epithelialization: Small (1-33%) Tunneling: No Undermining: No Wound Description Classification: Grade 2 Wound Margin: Thickened Exudate Amount: Large Exudate Type: Serosanguineous Exudate Color: red, brown Foul Odor After Cleansing: No Slough/Fibrino Yes Wound Bed Granulation Amount: Large (67-100%) Exposed Structure Granulation Quality: Pink Fascia Exposed: No Necrotic Amount: Small (1-33%) Fat Layer (Subcutaneous Tissue) Exposed: Yes Necrotic Quality: Adherent Slough Tendon Exposed: No Muscle Exposed: No Joint Exposed: No Bone Exposed: No Treatment Notes Wound #3 (Foot) Wound Laterality: Left, Lateral Cleanser Soap and Water Discharge Instruction: May shower and wash wound with dial antibacterial soap and water prior to dressing change. Peri-Wound Care Zinc Oxide Ointment 30g tube Discharge Instruction: Apply Zinc Oxide to periwound with each dressing change as needed. Topical Primary Dressing Hydrofera Blue Classic Foam, 2x2 in Discharge Instruction: Moisten with saline prior to applying to wound bed Secondary Dressing Woven Gauze Sponge, Non-Sterile 4x4 in Discharge Instruction: Apply over primary dressing as directed. ABD Pad, 8x10 Discharge Instruction: T down before applying kerlix. Apply over primary dressing as directed. ape Zetuvit Plus 4x8 in Discharge Instruction: Apply over primary dressing as directed. Secured With [employer], 4.5x3.1 (in/yd) Discharge Instruction: Secure with Kerlix as directed. Compression Wrap Kerlix Roll 4.5x3.1 (in/yd) Discharge Instruction: Apply Kerlix and Coban compression under TCC. Coban Self-Adherent Wrap  4x5 (in/yd) Discharge Instruction: Apply over Kerlix as directed. Compression Stockings Add-Ons Electronic Signature(s) Signed: 10/06/2020 5:48:24 PM By: Sandre Kitty Signed: 10/06/2020 7:10:28 PM By: [name] RN, BSN Entered By: Sandre Kitty on 10/06/2020 17:40:20 -------------------------------------------------------------------------------- Wound Assessment Details Patient Name: Date of Service: [name], [name] 10/06/2020 3:00 PM Medical Record Number: [id number] Patient Account Number: [id number] Date of Birth/Sex: Treating RN: 21-Feb-1986 (35 y.o. [name] Primary Care Eufemia Prindle: [name] Other Clinician: Referring Jaiyden Laur: Treating Abbie Jablon/Extender: [name] in Treatment: 49 Wound  Status Wound Number: 5 Primary Etiology: Diabetic Wound/Ulcer of the Lower Extremity Wound Location: Right, Lateral Metatarsal head fifth Wound Status: Open Wounding Event: Gradually Appeared Comorbid History: Type II Diabetes Date Acquired: 07/07/2020 Weeks Of Treatment: 12 Clustered Wound: No Photos Wound Measurements Length: (cm) 2 Width: (cm) 2.5 Depth: (cm) 1.3 Area: (cm) 3.927 Volume: (cm) 5.105 % Reduction in Area: 55.4% % Reduction in Volume: -16.1% Epithelialization: Small (1-33%) Tunneling: No Undermining: Yes Starting Position (o'clock): 6 Ending Position (o'clock): 12 Maximum Distance: (cm) 1.5 Wound Description Classification: Grade 2 Wound Margin: Distinct, outline attached Exudate Amount: Medium Exudate Type: Serosanguineous Exudate Color: red, brown Foul Odor After Cleansing: No Slough/Fibrino Yes Wound Bed Granulation Amount: Medium (34-66%) Exposed Structure Granulation Quality: Pink Fascia Exposed: No Necrotic Amount: Medium (34-66%) Fat Layer (Subcutaneous Tissue) Exposed: Yes Necrotic Quality: Adherent Slough Tendon Exposed: No Muscle Exposed: No Joint Exposed: No Bone Exposed: Yes Treatment  Notes Wound #5 (Metatarsal head fifth) Wound Laterality: Right, Lateral Cleanser Normal Saline Discharge Instruction: Cleanse the wound with Normal Saline prior to applying a clean dressing using gauze sponges, not tissue or cotton balls. Soap and Water Discharge Instruction: May shower and wash wound with dial antibacterial soap and water prior to dressing change. Peri-Wound Care Topical Primary Dressing KerraCel Ag Gelling Fiber Dressing, 2x2 in (silver alginate) Discharge Instruction: Apply silver alginate to wound bed as instructed Secondary Dressing ABD Pad, 5x9 Discharge Instruction: Apply over primary dressing as directed. Optifoam Non-Adhesive Dressing, 4x4 in Discharge Instruction: Apply over primary dressing, cut to make foam donut to offload Secured With [employer] Sterile, 4.5x3.1 (in/yd) Discharge Instruction: Secure with Kerlix as directed. 9M Medipore H Soft Cloth Surgical T ape, 2x2 (in/yd) Discharge Instruction: Secure dressing with tape as directed. Compression Wrap Compression Stockings Add-Ons Electronic Signature(s) Signed: 10/06/2020 5:48:24 PM By: Sandre Kitty Signed: 10/06/2020 7:10:28 PM By: [name] RN, BSN Entered By: Sandre Kitty on 10/06/2020 17:39:59 -------------------------------------------------------------------------------- Wound Assessment Details Patient Name: Date of Service: [name], [name] 10/06/2020 3:00 PM Medical Record Number: [id number] Patient Account Number: [id number] Date of Birth/Sex: Treating RN: [birth year] (35 y.o. [name] Primary Care Spencer Peterkin: [name] Other Clinician: Referring Vergil Burby: Treating Endre Coutts/Extender: [name] in Treatment: 49 Wound Status Wound Number: 7 Primary Etiology: Diabetic Wound/Ulcer of the Lower Extremity Wound Location: Right, Anterior Ankle Wound Status: Open Wounding Event: Gradually Appeared Comorbid History: Type II  Diabetes Date Acquired: 08/17/2020 Weeks Of Treatment: 7 Clustered Wound: No Photos Wound Measurements Length: (cm) 0.8 Width: (cm) 0.3 Depth: (cm) 0.1 Area: (cm) 0.188 Volume: (cm) 0.019 % Reduction in Area: 91.7% % Reduction in Volume: 91.6% Epithelialization: Large (67-100%) Tunneling: No Undermining: No Wound Description Classification: Grade 2 Wound Margin: Distinct, outline attached Exudate Amount: Medium Exudate Type: Serosanguineous Exudate Color: red, brown Foul Odor After Cleansing: No Slough/Fibrino Yes Wound Bed Granulation Amount: Large (67-100%) Exposed Structure Granulation Quality: Red Fascia Exposed: No Necrotic Amount: Small (1-33%) Fat Layer (Subcutaneous Tissue) Exposed: Yes Necrotic Quality: Adherent Slough Tendon Exposed: No Muscle Exposed: No Joint Exposed: No Bone Exposed: No Treatment Notes Wound #7 (Ankle) Wound Laterality: Right, Anterior Cleanser Normal Saline Discharge Instruction: Cleanse the wound with Normal Saline prior to applying a clean dressing using gauze sponges, not tissue or cotton balls. Soap and Water Discharge Instruction: May shower and wash wound with dial antibacterial soap and water prior to dressing change. Peri-Wound Care Topical Primary Dressing KerraCel Ag Gelling Fiber Dressing, 2x2 in (silver alginate) Discharge Instruction: Apply silver alginate  to wound bed as instructed Secondary Dressing Woven Gauze Sponge, Non-Sterile 4x4 in Discharge Instruction: Apply over primary dressing as directed. ABD Pad, 5x9 Discharge Instruction: Apply over primary dressing as directed. Secured With [employer], 4.5x3.1 (in/yd) Discharge Instruction: Secure with Kerlix as directed. 51M Medipore H Soft Cloth Surgical T ape, 2x2 (in/yd) Discharge Instruction: Secure dressing with tape as directed. Compression Wrap Compression Stockings Add-Ons Electronic Signature(s) Signed: 10/06/2020 5:48:24 PM By: Sandre Kitty Signed: 10/06/2020 7:10:28 PM By: [name] RN, BSN Entered By: Sandre Kitty on 10/06/2020 17:39:24 -------------------------------------------------------------------------------- Vitals Details Patient Name: Date of Service: [name], [name] 10/06/2020 3:00 PM Medical Record Number: [id number] Patient Account Number: [id number] Date of Birth/Sex: Treating RN: 02-11-1986 (36 y.o. [name] Primary Care Reyn Faivre: [name] Other Clinician: Referring Vester Balthazor: Treating Keylee Shrestha/Extender: [name] in Treatment: 49 Vital Signs Time Taken: 16:31 Temperature (F): 99.8 Height (in): 77 Pulse (bpm): 102 Weight (lbs): 280 Respiratory Rate (breaths/min): 18 Body Mass Index (BMI): 33.2 Blood Pressure (mmHg): 123/85 Capillary Blood Glucose (mg/dl): 148 Reference Range: 80 - 120 mg / dl Notes glucose per pt report Electronic Signature(s) Signed: 10/06/2020 7:10:28 PM By: [name] RN, BSN Entered By: [name] on 10/06/2020 16:46:43

## 2020-10-07 NOTE — Progress Notes (Addendum)
Rampersaud, Max ([id number]) Visit Report for 10/06/2020 Debridement Details Patient Name: Date of Service: [name] 10/06/2020 3:00 PM Medical Record Number: [id number] Patient Account Number: [id number] Date of Birth/Sex: Treating RN: 09-20-1985 (35 y.o. [name] Primary Care Provider: [name] Other Clinician: Referring Provider: Treating Provider/Extender: [name] in Treatment: 49 Debridement Performed for Assessment: Wound #3 Left,Lateral Foot Performed By: Physician [name], MD Debridement Type: Debridement Severity of Tissue Pre Debridement: Fat layer exposed Level of Consciousness (Pre-procedure): Awake and Alert Pre-procedure Verification/Time Out Yes - 17:24 Taken: Start Time: 17:25 Pain Control: Other : Benzocaine T Area Debrided (L x W): otal 2.5 (cm) x 2.5 (cm) = 6.25 (cm) Tissue and other material debrided: Non-Viable, Subcutaneous Level: Skin/Subcutaneous Tissue Debridement Description: Excisional Instrument: Curette Bleeding: Moderate Hemostasis Achieved: Pressure End Time: 17:33 Response to Treatment: Procedure was tolerated well Level of Consciousness (Post- Awake and Alert procedure): Post Debridement Measurements of Total Wound Length: (cm) 2.5 Width: (cm) 2.5 Depth: (cm) 0.2 Volume: (cm) 0.982 Character of Wound/Ulcer Post Debridement: Stable Severity of Tissue Post Debridement: Fat layer exposed Post Procedure Diagnosis Same as Pre-procedure Electronic Signature(s) Signed: 10/06/2020 7:08:24 PM By: [name] Signed: 10/07/2020 1:36:13 PM By: [name] MD Entered By: [name] on 10/06/2020 17:54:01 -------------------------------------------------------------------------------- HPI Details Patient Name: Date of Service: [name], [name] 10/06/2020 3:00 PM Medical Record Number: [id number] Patient Account Number: [id number] Date of Birth/Sex: Treating RN: 1985-11-04 (35  y.o. 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 He comes in for review of a wound over the plantar fifth metatarsal head extending into the lateral part of the foot. He was followed for this previously by his podiatrist Dr. Cornelius Moras. As the patient tells his story he went to see podiatry first for a swelling he developed on the lateral part of his fifth metatarsal head in May. He states this was "open" by podiatry and the area closed. He was followed up in June and it was again opened callus removed and it closed promptly. There were plans being made for surgery on the fifth metatarsal head in June however his blood sugar was apparently too high for anesthesia. Apparently the area was debrided and opened again in June and it is never closed since. Looking over the records from podiatry I am really not able to follow this. It was clear when he was first seen it was before 5/14 at that point he already had a wound. By 5/17 the ulcer was resolved. I do not see anything about a procedure. On 5/28 noted to have pre-ulcerative moderate keratosis. X-ray noted 1/5 contracted toe and tailor's bunion and metatarsal deformity. On a visit date on 09/28/2018 the dorsal part of the left foot it healed and resolved. There was concern about swelling in his lower extremity he was sent to the ER.. As far as I can tell he was seen in the ER on 7/12 with an ulcer on his left foot. A DVT rule out of the left leg was negative. I do not think I have complete records from podiatry but I am not able to verify the procedures this patient states he had. He states after the last procedure the wound has never closed although I am not able  to follow this  in the records I have from podiatry. He has not had a recent x-ray The patient has been using Neosporin on the wound. He is wearing a Darco shoe. He is still very active up on his foot working and exercising. Past medical history; type 2 diabetes ketosis-prone, leg swelling with a negative DVT study in July. Non-smoker ABI in our clinic was 0.85 on the left 10/16; substantial wound on the plantar left fifth met head extending laterally almost to the dorsal fifth MTP. We have been using silver alginate we gave him a Darco forefoot off loader. An x-ray did not show evidence of osteomyelitis did note soft tissue emphysema which I think was due to gas tracking through an open wound. There is no doubt in my mind he requires an MRI 10/23; MRI not booked until 3 November at the earliest this is largely due to his glucose sensor in the right arm. We have been using silver alginate. There has been an improvement 10/29; I am still not exactly sure when his MRI is booked for. He says it is the third but it is the 10th in epic. This definitely needs to be done. He is running a low-grade fever today but no other symptoms. No real improvement in the 1 02/26/2019 patient presents today for a follow-up visit here in our clinic he is last been seen in the clinic on October 29. Subsequently we were working on getting MRI to evaluate and see what exactly was going on and where we would need to go from the standpoint of whether or not he had osteomyelitis and again what treatments were going be required. Subsequently the patient ended up being admitted to the hospital on 02/07/2019 and was discharged on 02/14/2019. This is a somewhat interesting admission with a discharge diagnosis of pneumonia due to COVID-19 although he was positive for COVID-19 when tested at the urgent care but negative x2 when he was actually in the hospital. With that being said he did have acute respiratory failure with hypoxia and it was noted  he also have a left foot ulceration with osteomyelitis. With that being said he did require oxygen for his pneumonia and I level 4 L. He was placed on antivirals and steroids for the COVID-19. He was also transferred to the [facility] at one point. Nonetheless he did subsequently discharged home and since being home has done much better in that regard. The CT angiogram did not show any pulmonary embolism. With regard to the osteomyelitis the patient was placed on vancomycin and Zosyn while in the hospital but has been changed to Augmentin at discharge. It was also recommended that he follow- up with wound care and podiatry. Podiatry however wanted him to see [name] according to the patient prior to them doing anything further. His hemoglobin A1c was 9.9 as noted in the hospital. Have an MRI of the left foot performed while in the hospital on 02/04/2019. 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 There is an overlying plantar open wound noted an abscess tracking back along the lateral aspect of the fifth metatarsal shaft. There is otherwise diffuse cellulitis and mild fasciitis without findings of polymyositis. The patient did have recently pneumonia secondary to COVID-19 I looked in the chart through epic and it does appear that the patient may need to have an additional x-ray just to ensure everything is cleared and that  he has no airspace disease prior to putting him into the [name]. 03/05/2019; patient was readmitted to the clinic last week. He was hospitalized twice for a viral upper respiratory tract infection from 11/1 through 11/4 and then 11/5 through 11/12 ultimately this turned out to be Covid pneumonitis. Although he was discharged on oxygen he is not using it. He says he feels fine. He has no exercise limitation no cough no sputum. His O2 sat in our clinic today was 100% on room air. He did  manage to have his MRI which showed septic arthritis at the fifth MTP joint and osteomyelitis involving the fifth metatarsal head and proximal phalanx. He received Vanco and Zosyn in the hospital and then was discharged on 2 weeks of Augmentin. I do not see any relevant cultures. He was supposed to follow-up with infectious disease but I do not see that he has an appointment. 12/8; patient saw Dr. Novella Olive of infectious disease last week. He felt that he had had adequate antibiotic therapy. He did not go to follow-up with Dr. Amalia Hailey of podiatry and I have again talked to him about the pros and cons of this. He does not want to consider a ray amputation of this time. He is aware of the risks of recurrence, migration etc. He started HBO today and tolerated this well. He can complete the Augmentin that I gave him last week. I have looked over the lab work that Dr. Chana Bode ordered his C-reactive protein was 3.3 and his sedimentation rate was 17. The C-reactive protein is never really been measurably that high in this patient 12/15; not much change in the wound today however he has undermining along the lateral part of the foot again more extensively than last week. He has some rims of epithelialization. We have been using silver alginate. He is undergoing hyperbarics but did not dive today 12/18; in for his obligatory first total contact cast change. Unfortunately there was pus coming from the undermining area around his fifth metatarsal head. This was cultured but will preclude reapplication of a cast. He is seen in conjunction with HBO 12/24; patient had staph lugdunensis in the wound in the undermining area laterally last time. We put him on doxycycline which should have covered this. The wound looks better today. I am going to give him another week of doxycycline before reattempting the total contact cast 12/31; the patient is completing antibiotics. Hemorrhagic debris in the distal part of the wound with  some undermining distally. He also had hyper granulation. Extensive debridement with a #5 curette. The infected area that was on the lateral part of the fifth met head is closed over. I do not think he needs any more antibiotics. Patient was seen prior to HBO. Preparations for a total contact cast were made in the cast will be placed post hyperbarics 04/11/19; once again the patient arrives today without complaint. He had been in a cast all week noted that he had heavy drainage this week. This resulted in large raised areas of macerated tissue around the wound 1/14; wound bed looks better slightly smaller. Hydrofera Blue has been changing himself. He had a heavy drainage last week which caused a lot of maceration around the wound so I took him out of a total contact cast he says the drainage is actually better this week He is seen today in conjunction with HBO 1/21; returns to clinic. He was up in [place] for a day or 2 attending a funeral. He comes back  in with the wound larger and with a large area of exposed bone. He had osteomyelitis and septic arthritis of the fifth left metatarsal head while he was in hospital. He received IV antibiotics in the hospital for a prolonged period of time then 3 weeks of Augmentin. Subsequently I gave him 2 weeks of doxycycline for more superficial wound infection. When I saw this last week the wound was smaller the surface of the wound looks satisfactory. 1/28; patient missed hyperbarics today. Bone biopsy I did last time showed Enterococcus faecalis and Staphylococcus lugdunensis . He has a wide area of exposed bone. We are going to use silver alginate as of today. I had another ethical discussion with the patient. 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 In this situation I think the likelihood of healing this is low. Therefore I have recommended a ray amputation and with the patient's agreement I have referred him to Dr.  Doran Durand. The other issue is that his compliance with hyperbarics has been minimal because of his work schedule and given his underlying decision I am going to stop this today READMISSION 10/24/2019 MRI 09/29/2019 left foot IMPRESSION: 1. Apparent skin ulceration inferior and lateral to the 5th metatarsal base with underlying heterogeneous T2 signal and enhancement in the subcutaneous fat. Small peripherally enhancing fluid collections along the plantar and lateral aspects of the 5th metatarsal base suspicious for abscesses. 2. Interval amputation through the mid 5th metatarsal with nonspecific low-level marrow edema and enhancement. Given the proximity to the adjacent soft tissue inflammatory changes, osteomyelitis cannot be excluded. 3. The additional bones appear unremarkable. MRI 09/29/2019 right foot IMPRESSION: 1. Soft tissue ulceration lateral to the 5th MTP joint. There is low-level T2 hyperintensity within the 4th and 5th metatarsal heads and adjacent proximal phalanges without abnormal T1 signal or cortical destruction. These findings are nonspecific and could be seen with early marrow edema, hyperemia or early osteomyelitis. No evidence of septic joint. 2. Mild tenosynovitis and synovial enhancement associated with the extensor digitorum tendons at the level of the midfoot. 3. Diffuse low-level muscular T2 hyperintensity and enhancement, most consistent with diabetic myopathy. LEFT FOOT BONE Methicillin resistant staphylococcus aureus Staphylococcus lugdunensis MIC MIC CIPROFLOXACIN >=8 RESISTANT Resistant <=0.5 SENSI... Sensitive CLINDAMYCIN <=0.25 SENS... Sensitive >=8 RESISTANT Resistant ERYTHROMYCIN >=8 RESISTANT Resistant >=8 RESISTANT Resistant GENTAMICIN <=0.5 SENSI... Sensitive <=0.5 SENSI... Sensitive Inducible Clindamycin NEGATIVE Sensitive NEGATIVE Sensitive OXACILLIN >=4 RESISTANT Resistant 2 SENSITIVE Sensitive RIFAMPIN <=0.5 SENSI... Sensitive <=0.5 SENSI...  Sensitive TETRACYCLINE <=1 SENSITIVE Sensitive <=1 SENSITIVE Sensitive TRIMETH/SULFA <=10 SENSIT Sensitive <=10 SENSIT Sensitive ... Marland Kitchen.. VANCOMYCIN 1 SENSITIVE Sensitive <=0.5 SENSI... Sensitive Right foot bone . Component 3 wk ago Specimen Description BONE Special Requests RIGHT 4 METATARSAL SAMPLE B Gram Stain NO WBC SEEN NO ORGANISMS SEEN Culture RARE METHICILLIN RESISTANT STAPHYLOCOCCUS AUREUS NO ANAEROBES ISOLATED Performed at [facility], [place] [address], [place], [place] [address] Report Status 10/08/2019 FINAL Organism ID, Bacteria METHICILLIN RESISTANT STAPHYLOCOCCUS AUREUS Resulting Agency [facility] Susceptibility Methicillin resistant staphylococcus aureus MIC CIPROFLOXACIN >=8 RESISTANT Resistant CLINDAMYCIN <=0.25 SENS... Sensitive ERYTHROMYCIN >=8 RESISTANT Resistant GENTAMICIN <=0.5 SENSI... Sensitive Inducible Clindamycin NEGATIVE Sensitive OXACILLIN >=4 RESISTANT Resistant RIFAMPIN <=0.5 SENSI... Sensitive TETRACYCLINE <=1 SENSITIVE Sensitive TRIMETH/SULFA <=10 SENSIT Sensitive ... VANCOMYCIN 1 SENSITIVE Sensitive This is a patient we had in clinic earlier this year with a wound over his left fifth metatarsal head. 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 Doran Durand on 05/16/2019. He seems to have developed problems with wounds on his bilateral feet in June 2021 although he may have had problems earlier than this. He was in an urgent care with a right foot ulcer on 09/26/2019 and given a course of doxycycline. This was apparently after having trouble getting into see orthopedics. He was seen by podiatry on 09/28/2019 noted to have bilateral lower extremity ulcers including the left  lateral fifth metatarsal base and the right subfifth met head. It was noted that had purulent drainage at that time. He required hospitalization from 6/20 through 7/2. This was because of worsening right foot wounds. He underwent bilateral operative incision and drainage and bone biopsies bilaterally. Culture results are listed above. He has been referred back to clinic by Dr. Jacqualyn Posey of podiatry. He is also followed by Dr. Megan Salon who saw him yesterday. He was discharged from hospital on Zyvox Flagyl and Levaquin and yesterday changed to doxycycline Flagyl and Levaquin. His inflammatory markers on 6/26 showed a sedimentation rate of 129 and a C-reactive protein of 5. This is improved to 14 and 1.3 respectively. This would indicate improvement. ABIs in our clinic today were 1.23 on the right and 1.20 on the left 11/01/2019 on evaluation today patient appears to be doing fairly well in regard to the wounds on his feet at this point. Fortunately there is no signs of active infection at this time. No fevers, chills, nausea, vomiting, or diarrhea. He currently is seeing infectious disease and still under their care at this point. Subsequently he also has both wounds which she has not been using collagen on as he did not receive that in his packaging he did not call us and let us know that. Apparently that just was missed on the order. Nonetheless we will get that straightened out today. 8/9-Patient returns for bilateral foot wounds, using Prisma with hydrogel moistened dressings, and the wounds appear stable. Patient using surgical shoes, avoiding much pressure or weightbearing as much as possible 8/16; patient has bilateral foot wounds. 1 on the right lateral foot proximally the other is on the left mid lateral foot. Both required debridement of callus and thick skin around the wounds. We have been using silver collagen 8/27; patient has bilateral lateral foot wounds. The area on the left substantially  surrounded by callus and dry skin. This was removed from the wound edge. The underlying wound is small. The area on the right measured somewhat smaller today. 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 Unfortunately I did not update his antibiotics during today's visit. 9/10 I reviewed Dr. Hale Bogus last notes he felt he had completed antibiotics his inflammatory markers were reasonably well controlled. He has a small wound on the lateral left foot and a tiny area on the right which is just above closed. He is using Hydrofera Blue with border foam he has bilateral surgical shoes 9/24; 2 week f/u. doing well. right foot is closed. left foot still undermined. 10/14; right foot remains closed at the fifth met head. The area over the base of the left fifth metatarsal has a small open area but considerable undermining towards the plantar foot. Thick callus skin around this suggests an adequate pressure relief. We have talked about this. He  says he is going to go back into his cam boot. I suggested a total contact cast he did not seem enamored with this suggestion 10/26; left foot base of the fifth metatarsal. Same condition as last time. He has skin over the area with an open wound however the skin is not adherent. He went to see Dr. Earleen Newport who did an x-ray and culture of his foot I have not reviewed the x-ray but the patient was not told anything. He is on doxycycline 11/11; since the patient was last here he was in the emergency room on 10/30 he was concerned about swelling in the left foot. They did not do any cultures or x-rays. They changed his antibiotics to cephalexin. Previous culture showed group B strep. The cephalexin is appropriate as doxycycline has less than predictable coverage. Arrives in clinic today with swelling over this area under the wound. He also has a new wound on the right fifth metatarsal head 11/18; the patient  has a difficult wound on the lateral aspect of the left fifth metatarsal head. The wound was almost ballotable last week I opened it slightly expecting to see purulence however there was just bleeding. I cultured this this was negative. X-ray unchanged. We are trying to get an MRI but I am not sure were going to be able to get this through his insurance. He also has an area on the right lateral fifth metatarsal head this looks healthier 12/3; the patient finally got our MRI. Surprisingly this did not show osteomyelitis. I did show the soft tissue ulceration at the lateral plantar aspect of the fifth metatarsal base with a tiny residual 6 mm abscess overlying the superficial fascia I have tried to culture this area I have not been able to get this to grow anything. Nevertheless the protruding tissue looks aggravated. I suspect we should try to treat the underlying "abscess with broad-spectrum antibiotics. I am going to start him on Levaquin and Flagyl. He has much less edema in his legs and I am going to continue to wrap his legs and see him weekly 12/10. I started Levaquin and Flagyl on him last week. He just picked up the Flagyl apparently there was some delay. The worry is the wound on the left fifth metatarsal base which is substantial and worsening. His foot looks like he inverts at the ankle making this a weightbearing surface. Certainly no improvement in fact I think the measurements of this are somewhat worse. We have been using 12/17; he apparently just got the Levaquin yesterday this is 2 weeks after the fact. He has completed the Flagyl. The area over the left fifth metatarsal base still has protruding granulation tissue although it does not look quite as bad as it did some weeks ago. He has severe bilateral lymphedema although we have not been treating him for wounds on his legs this is definitely going to require compression. There was so much edema in the left I did not wish to put him in  a total contact cast today. I am going to increase his compression from 3-4 layer. The area on the right lateral fifth met head actually look quite good and superficial. 12/23; patient arrived with callus on the right fifth met head and the substantial hyper granulated callused wound on the base of his fifth metatarsal. He says he is completing his Levaquin in 2 days but I do not think that adds up with what I gave him but I will have to double  check this. We are using Hydrofera Blue on both areas. My plan is to put the left leg in a cast the week after New Year's 04/06/2020; patient's wounds about the same. Right lateral fifth metatarsal head and left lateral foot over the base of the fifth metatarsal. There is undermining on the left lateral foot which I removed before application of total contact cast continuing with Hydrofera Blue new. Patient tells me he was seen by endocrinology today lab work was done [Dr. Kerr]. Also wondering whether he was referred to cardiology. I went over some lab work from previously does not have chronic renal failure certainly not nephrotic range proteinuria he does have very poorly controlled diabetes but this is not his most updated lab work. Hemoglobin A1c has been over 11 1/10; the patient had a considerable amount of leakage towards mid part of his left foot with macerated skin however the wound surface looks better the area on the right lateral fifth met head is better as well. I am going to change the dressing on the left foot under the total contact cast to silver alginate, continue with Hydrofera Blue on the right. 1/20; patient was in the total contact cast for 10 days. Considerable amount of drainage although the skin around the wound does not look too bad on the left foot. The area on the right fifth metatarsal head is closed. Our nursing staff reports large amount of drainage out of the left lateral foot wound 1/25; continues with copious amounts of  drainage described by our intake staff. PCR culture I did last week showed E. coli and Enterococcus faecalis and low quantities. Multiple resistance genes documented including extended spectrum beta lactamase, MRSA, MRSE, quinolone, tetracycline. The wound is not quite as good this week as it was 5 days ago but about the same size 2/3; continues with copious amounts of malodorous drainage per our intake nurse. The PCR culture I did 2 weeks ago showed E. coli and low quantities of Enterococcus. There were multiple resistance genes detected. I put Neosporin on him last week although this does not seem to have helped. The wound is slightly deeper today. Offloading continues to be an issue here although with the amount of drainage she has a total contact cast is just not going to work 2/10; moderate amount of drainage. Patient reports he cannot get his stocking on over the dressing. I told him we have to do that the nurse gave him suggestions on how to make this work. The wound is on the bottom and lateral part of his left foot. Is cultured predominantly grew low amounts of Enterococcus, E. coli and anaerobes. There were multiple resistance genes detected including extended spectrum beta lactamase, quinolone, tetracycline. I could not think of an easy oral combination to address this so for now I am going to do topical antibiotics provided by [facility] I think the main agents here are vancomycin and an aminoglycoside. We have to be able to give him access to the wounds to get the topical antibiotic on 2/17; moderate amount of drainage this is unchanged. He has his Keystone topical antibiotic against the deep tissue culture organisms. He has been using this and changing the dressing daily. Silver alginate on the wound surface. 2/24; using Keystone antibiotic with silver alginate on the top. He had too much drainage for a total contact cast at one point although I think that is improving and I think in the  next week or 2 it might be possible  to replace a total contact cast I did not do this today. In general the wound surface looks healthy however he continues to have thick rims of skin and subcutaneous tissue around the wide area of the circumference which I debrided 06/04/2020 upon evaluation today patient appears to be doing well in regard to his wound. I do feel like he is showing signs of improvement. There is little bit of callus and dead tissue around the edges of the wound as well as what appears to be a little bit of a sinus tract that is off to the side laterally I would perform debridement to clear that away today. 3/17; left lateral foot. The wound looks about the same as I remember. Not much depth surface looks healthy. No evidence of infection 3/25; left lateral foot. Wound surface looks about the same. Separating epithelium from the circumference. There really is no evidence of infection here however not making progress by my view 3/29; left lateral foot. Surface of the wound again looks reasonably healthy still thick skin and subcutaneous tissue around the wound margins. There is no evidence of infection. One of the concerns being brought up by the nurses has again the amount of drainage vis--vis continued use of a total contact cast 4/5; left lateral foot at roughly the base of the fifth metatarsal. Nice healthy looking granulated tissue with rims of epithelialization. The overall wound measurements are not any better but the tissue looks healthy. The only concern is the amount of drainage although he has no surrounding maceration with what we have been doing recently to absorb fluid and protect his skin. He also has lymphedema. 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 He states that the kerlix/coban under the TCC rolled down and could not get it back up. He has been using an offloading scooter and has somehow hurt his right foot using this device. This happened last week. He states that the side of his right foot developed a blister and opened. The top of his foot also has a few small open wounds he thinks is due to his socks rubbing in his shoes. He has not been using any dressings to the wound. He denies purulent drainage, fever/chills or erythema to the wounds. 4/22; patient presents for 1 week follow-up. He developed new wounds to the right foot that were evaluated at last clinic visit. He continues to have a total contact cast to the left leg and he reports no issues. He has been using silver collagen to the right foot wounds with no issues. He denies purulent drainage, fever/chills or erythema to the right foot wounds. He has no complaints today 4/25; patient presents for 1 week follow-up. He has a total contact cast of the left leg and reports no issues. He has been using silver alginate to the right foot wound. He denies purulent drainage, fever/chills or erythema to the right foot wounds. 5/2 patient presents for 1 week follow-up. T contact cast on the left. The wound which is on the base of the plantar foot at the base of the fifth metatarsal otal actually looks quite good and dimensions continue to gradually contract. HOWEVER the area on the right lateral fifth metatarsal head is much larger  than what I remember from 2 weeks ago. Once more is he has significant levels of hypergranulation. Noteworthy that he had this same hyper granulated response on his wound on the left foot at one point in time. So much so that he I thought there was an underlying fluid collection. Based on this I think this just needs debridement. 5/9; the wound on the left actually continues to be gradually smaller with a healthy surface. Slight amount  of drainage and maceration of the skin around but not too bad. However he has a large wound over the right fifth metatarsal head very much in the same configuration as his left foot wound was initially. I used silver nitrate to address the hyper granulated tissue no mechanical debridement 5/16; area on the left foot did not look as healthy this week deeper thick surrounding macerated skin and subcutaneous tissue. The area on the right foot fifth met head was about the same The area on the right ankle that we identified last week is completely broken down into an open wound presumably a stocking rubbing issue 5/23; patient has been using a total contact cast to the left side. He has been using silver alginate underneath. He has also been using silver alginate to the right foot wounds. He has no complaints today. He denies any signs of infection. 5/31; the left-sided wound looks some better measure smaller surface granulation looks better. We have been using silver alginate under the total contact cast The large area on his right fifth met head and right dorsal foot look about the same still using silver alginate 6/6; neither side is good as I was hoping although the surface area dimensions are better. A lot of maceration on his left and right foot around the wound edge. Area on the dorsal right foot looks better. He says he was traveling. I am not sure what does the amount of maceration around the plantar wounds may be drainage issues 6/13; in general the wound surfaces look quite good on both sides. Macerated skin and raised edges around the wound required debridement although in general especially on the left the surface area seems improved. The area on the right dorsal ankle is about the same I thought this would not be such a problem to close 6/20; not much change in either wound although the one on the right looks a little better. Both wounds have thick macerated edges to the skin  requiring debridements. We have been using silver alginate. The area on the dorsal right ankle is still open I thought this would be closed. 6/28; patient comes in today with a marked deterioration in the right foot wound fifth met head. Wide area of exposed bone this is a drastic change from last time. The area on the left there we have been casting is stagnant. We have been using silver alginate in both wound areas. 7/25; bone culture I did for PCR last time was positive for Pseudomonas, group B strep, Enterococcus and Staph aureus. There was no suggestion of methicillin resistance or ampicillin resistant genes. This was resistant to tetracycline however He comes into the clinic today with the area over his right plantar fifth metatarsal head which had been doing so well 2 weeks ago completely necrotic feeling bone. I do not know that this is going to be salvageable. The left foot wound is certainly no smaller but it has a better surface and is superficial. Electronic Signature(s) Signed: 10/07/2020 1:36:13 PM By: [name] MD Entered  By: [name] on 10/06/2020 17:55:49 -------------------------------------------------------------------------------- Physical Exam Details Patient Name: Date of Service: [name] 10/06/2020 3:00 PM Medical Record Number: [id number] Patient Account Number: [id number] Date of Birth/Sex: Treating RN: [birth year] (35 y.o. [name] Primary Care Provider: [name] Other Clinician: Referring Provider: Treating Provider/Extender: [name] in Treatment: 49 Constitutional Sitting or standing Blood Pressure is within target range for patient.. Pulse regular and within target range for patient.Marland Kitchen Respirations regular, non-labored and within target range.. Temperature is normal and within the target range for the patient.Marland Kitchen Appears in no distress. Notes Wound exam; On the left the wound on the left plantar first  MTP is about the same. Still not a completely viable surface and still some thick edges around the wound I used a #5 curette to debride this. Although is disappointing that the dimensions are not coming down the wound surface looks somewhat better to me Unfortunately on the right the area on the fifth MTP is down to bone. The bone itself does not feel viable. Certainly I think this man has osteomyelitis based not just on my culture but clinical exam. Electronic Signature(s) Signed: 10/07/2020 1:36:13 PM By: [name] MD Entered By: [name] on 10/06/2020 17:57:22 -------------------------------------------------------------------------------- Physician Orders Details Patient Name: Date of Service: [name], [name] 10/06/2020 3:00 PM Medical Record Number: [id number] Patient Account Number: [id number] Date of Birth/Sex: Treating RN: Apr 06, 1985 (35 y.o. 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 - ***60 minutes for cast*** - with Dr. Dellia Nims Return A Bathing/ Shower/ Hygiene May shower with protection but do not get wound dressing(s) wet. - use a cast protector to left leg. Edema Control - Lymphedema / SCD / Other Bilateral Lower Extremities Elevate legs to the level of the heart or above for 30 minutes daily and/or when sitting, a frequency of: - throughout the day Avoid standing for long periods of time. Exercise  regularly Moisturize legs daily. - right leg every night before bed. Compression stocking or Garment 20-30 mm/Hg pressure to: - Apply to right leg in the morning and remove at night. Off-Loading Total Contact Cast to Left Lower Extremity Open toe surgical shoe to: - right foot Wound Treatment Wound #3 - Foot Wound Laterality: Left, Lateral Cleanser: Soap and Water 1 x Per Week/30 Days Discharge Instructions: May shower and wash wound with dial antibacterial soap and water prior to dressing change. Peri-Wound Care: Zinc Oxide Ointment 30g tube 1 x Per Week/30 Days Discharge Instructions: Apply Zinc Oxide to periwound with each dressing change as needed. Prim Dressing: Hydrofera Blue Classic Foam, 2x2 in 1 x Per Week/30 Days ary Discharge Instructions: Moisten with saline prior to applying to wound bed Secondary Dressing: Woven Gauze Sponge, Non-Sterile 4x4 in 1 x Per Week/30 Days Discharge Instructions: Apply over primary dressing as directed. Secondary Dressing: ABD Pad, 8x10 1 x Per Week/30 Days Discharge Instructions: T down before applying kerlix. Apply over primary dressing as directed. ape Secondary Dressing: Zetuvit Plus 4x8 in 1 x Per Week/30  Days Discharge Instructions: Apply over primary dressing as directed. Secured With: [employer], 4.5x3.1 (in/yd) 1 x Per Week/30 Days Discharge Instructions: Secure with Kerlix as directed. Compression Wrap: Kerlix Roll 4.5x3.1 (in/yd) 1 x Per Week/30 Days Discharge Instructions: Apply Kerlix and Coban compression under TCC. Compression Wrap: Coban Self-Adherent Wrap 4x5 (in/yd) 1 x Per Week/30 Days Discharge Instructions: Apply over Kerlix as directed. Wound #5 - Metatarsal head fifth Wound Laterality: Right, Lateral Cleanser: Normal Saline (Generic) Every Other Day/15 Days Discharge Instructions: Cleanse the wound with Normal Saline prior to applying a clean dressing using gauze sponges, not tissue or cotton balls. Cleanser: Soap  and Water Every Other Day/15 Days Discharge Instructions: May shower and wash wound with dial antibacterial soap and water prior to dressing change. Prim Dressing: KerraCel Ag Gelling Fiber Dressing, 2x2 in (silver alginate) (Generic) Every Other Day/15 Days ary Discharge Instructions: Apply silver alginate to wound bed as instructed Secondary Dressing: ABD Pad, 5x9 (Generic) Every Other Day/15 Days Discharge Instructions: Apply over primary dressing as directed. Secondary Dressing: Optifoam Non-Adhesive Dressing, 4x4 in (Generic) Every Other Day/15 Days Discharge Instructions: Apply over primary dressing, cut to make foam donut to offload Secured With: Kerlix Roll Sterile, 4.5x3.1 (in/yd) (Generic) Every Other Day/15 Days Discharge Instructions: Secure with Kerlix as directed. Secured With: 102M Medipore H Soft Cloth Surgical Tape, 2x2 (in/yd) (Generic) Every Other Day/15 Days Discharge Instructions: Secure dressing with tape as directed. Wound #7 - Ankle Wound Laterality: Right, Anterior Cleanser: Normal Saline (Generic) Every Other Day/15 Days Discharge Instructions: Cleanse the wound with Normal Saline prior to applying a clean dressing using gauze sponges, not tissue or cotton balls. Cleanser: Soap and Water Every Other Day/15 Days Discharge Instructions: May shower and wash wound with dial antibacterial soap and water prior to dressing change. Prim Dressing: KerraCel Ag Gelling Fiber Dressing, 2x2 in (silver alginate) (Generic) Every Other Day/15 Days ary Discharge Instructions: Apply silver alginate to wound bed as instructed Secondary Dressing: Woven Gauze Sponge, Non-Sterile 4x4 in (Generic) Every Other Day/15 Days Discharge Instructions: Apply over primary dressing as directed. Secondary Dressing: ABD Pad, 5x9 (Generic) Every Other Day/15 Days Discharge Instructions: Apply over primary dressing as directed. Secured With: [employer], 4.5x3.1 (in/yd) (Generic) Every Other  Day/15 Days Discharge Instructions: Secure with Kerlix as directed. Secured With: 102M Medipore H Soft Cloth Surgical Tape, 2x2 (in/yd) (Generic) Every Other Day/15 Days Discharge Instructions: Secure dressing with tape as directed. Consults Infectious Disease - ASAP: Rapidly Declining Right Foot Wound - (ICD10 L97.518 - 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ity) Patient Medications llergies: metformin A Notifications Medication Indication Start End 10/06/2020 ciprofloxacin DOSE oral 500 mg/5 mL suspension,microcapsule recon - 1 tab Bid X 7 DAYS suspension,microcapsule recon oral 10/06/2020 Augmentin DOSE oral 875 mg-125 mg tablet - 1 tablet oral bid X 7 DAYS Electronic Signature(s) Signed: 10/13/2020 5:25:56 PM By: [name] MD Signed: 04/22/2021 12:40:52 PM By: Rhae Hammock RN Previous Signature: 10/06/2020 5:43:41 PM Version By: [name] Previous Signature: 10/07/2020 1:36:13 PM Version By: [name] MD Entered By: Rhae Hammock on 10/13/2020 17:22:16 Prescription 10/06/2020 -------------------------------------------------------------------------------- Rhyne, Max Idriss Quackenbush MD Patient Name: Provider: [birth year] [id number] Date of Birth: NPI#: Jerilynn Mages [id number] Sex: DEA #: [phone] [id number] Phone #: License #: [place] Patient Address: [address] [place], [place] [address] [place], [place] [address] [phone] Allergies metformin Provider's Orders Infectious Disease - ICD10: G64.403 -  ASAP: Rapidly Declining Right Foot Wound Hand Signature: Date(s): Prescription 10/06/2020 Yepes, Max Benicia Bergevin MD Patient Name: Provider: 25-Dec-1985 [id number] Date of Birth: NPI#: Jerilynn Mages [id number] Sex: DEA #: [phone] [id number] Phone #: License #: [place] Patient Address: [address] [place], [place] [address] [place], [place] [address] [phone] Allergies metformin Medication Medication: Route: Strength: Form: ciprofloxacin oral 500 mg/5 mL suspension,microcapsule recon Class: QUINOLONE ANTIBIOTICS Dose: Frequency / Time: Indication: 1 tab Bid X 7 DAYS suspension,microcapsule recon oral Number of Refills: Number of Units: 0 Milliliter(s) Generic Substitution: Start Date: End Date: Administered at Facility: Substitution Permitted 07/04/6382 No Note to Pharmacy: Hand Signature: Date(s): Prescription 10/06/2020 Knouff, Max Karlena Luebke MD Patient Name: Provider: 07/02/1985 [id number] Date of Birth: NPI#: Jerilynn Mages [id number] Sex: DEA #: [phone] [id number] Phone #: License #: [place] Patient Address: [address] [place], [place] [address] [place], [place] [address] [phone] Allergies metformin Medication Medication: Route: Strength: Form: Augmentin oral 875 mg-125 mg tablet Class: PENICILLIN ANTIBIOTICS Dose: Frequency / Time: Indication: 1 tablet oral bid X 7 DAYS Number of Refills: Number of Units: 0 Milliliter(s) Generic Substitution: Start Date: End Date: Administered at Facility: Substitution Permitted 0/04/6551 No Note to Pharmacy: Hand Signature: Date(s): Electronic Signature(s) Signed: 10/13/2020 5:25:56 PM By: [name] MD Signed: 04/22/2021 12:40:52 PM By: Rhae Hammock RN Previous Signature: 10/06/2020 7:08:24 PM Version By: [name] Previous Signature: 10/07/2020 1:36:13 PM Version By: [name] MD Entered By: Rhae Hammock on 10/13/2020 17:22:18 -------------------------------------------------------------------------------- Problem List Details Patient Name: Date of Service: [name], [name] 10/06/2020 3:00 PM Medical Record Number: [id number] Patient Account Number: [id number] Date of  Birth/Sex: Treating RN: [birth year] (35 y.o. 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 08/10/2020 No Yes Inactive Problems ICD-10 Code Description Active Date Inactive Date L97.518 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ity 10/24/2019 10/24/2019 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, right ankle and foot 10/24/2019 10/24/2019 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, left ankle and foot 10/24/2019 10/24/2019 B95.62 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 10/24/2019 10/24/2019 classified elsewhere Resolved Problems Electronic Signature(s) Signed: 10/07/2020 1:36:13 PM By: [name] MD Entered By: [name] on 10/06/2020 17:53:44 -------------------------------------------------------------------------------- Progress Note Details Patient Name: Date of Service: [name], [name] 10/06/2020 3:00 PM Medical Record Number: [id number] Patient Account Number: [id number] Date of Birth/Sex: Treating RN: March 06, 1986 (35 y.o. 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 He comes in for review of a wound over the plantar fifth metatarsal head extending into the lateral part of the foot. He was followed for this previously by his podiatrist Dr. Cornelius Moras. As the patient tells his story he went  to see podiatry first for a swelling he developed on the lateral part of his fifth metatarsal head in May. He states this was "open" by podiatry and the area closed. He was followed up in June and it was again opened callus removed and it closed promptly. There were plans being made for surgery on the fifth metatarsal head in June however his blood sugar was apparently too high for anesthesia. Apparently the area was debrided and opened again in June and it is never closed since. Looking over the records from podiatry I am really not able to follow this. It was clear when he was first seen it was before 5/14 at that point he already had a wound. By 5/17 the ulcer was resolved. I do not see anything about a procedure. On 5/28 noted to have pre-ulcerative moderate keratosis. X-ray noted 1/5 contracted toe and tailor's bunion and metatarsal deformity. On a visit date on 09/28/2018 the dorsal part of the left foot it healed and resolved. There was concern about swelling in his lower extremity he was sent to the ER.. As far as I can tell he was seen in the ER on 7/12 with an ulcer on his left foot. A DVT rule out of the left leg was negative. I do not think I have complete records from podiatry but I am not able to verify the procedures this patient states he had. He states after the last procedure the wound has never closed although I am not able to follow this in the records I have from podiatry. He has not had a recent x-ray The patient has been using Neosporin on the wound. He is wearing a Darco shoe. He is still very active up on his foot working and exercising. Past medical history; type 2 diabetes ketosis-prone, leg swelling with a negative DVT study in July. Non-smoker ABI  in our clinic was 0.85 on the left 10/16; substantial wound on the plantar left fifth met head extending laterally almost to the dorsal fifth MTP. We have been using silver alginate we gave him a Darco forefoot off loader. An x-ray did not show evidence of osteomyelitis did note soft tissue emphysema which I think was due to gas tracking through an open wound. There is no doubt in my mind he requires an MRI 10/23; MRI not booked until 3 November at the earliest this is largely due to his glucose sensor in the right arm. We have been using silver alginate. There has been an improvement 10/29; I am still not exactly sure when his MRI is booked for. He says it is the third but it is the 10th in epic. This definitely needs to be done. He is running a low-grade fever today but no other symptoms. No real improvement in the 1 02/26/2019 patient presents today for a follow-up visit here in our clinic he is last been seen in the clinic on October 29. Subsequently we were working on getting MRI to evaluate and see what exactly was going on and where we would need to go from the standpoint of whether or not he had osteomyelitis and again what treatments were going be required. Subsequently the patient ended up being admitted to the hospital on 02/07/2019 and was discharged on 02/14/2019. This is a somewhat interesting admission with a discharge diagnosis of pneumonia due to COVID-19 although he was positive for COVID-19 when tested at the urgent care but negative x2 when he was actually in the hospital. With that being  said he did have acute respiratory failure with hypoxia and it was noted he also have a left foot ulceration with osteomyelitis. With that being said he did require oxygen for his pneumonia and I level 4 L. He was placed on antivirals and steroids for the COVID-19. He was also transferred to the [facility] at one point. Nonetheless he did subsequently discharged home and since being home  has done much better in that regard. The CT angiogram did not show any pulmonary embolism. With regard to the osteomyelitis the patient was placed on vancomycin and Zosyn while in the hospital but has been changed to Augmentin at discharge. It was also recommended that he follow- up with wound care and podiatry. Podiatry however wanted him to see [name] according to the patient prior to them doing anything further. His hemoglobin A1c was 9.9 as noted in the hospital. Have an MRI of the left foot performed while in the hospital on 02/04/2019. 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 There is an overlying plantar open wound noted an abscess tracking back along the lateral aspect of the fifth metatarsal shaft. There is otherwise diffuse cellulitis and mild fasciitis without findings of polymyositis. The patient did have recently pneumonia secondary to COVID-19 I looked in the chart through epic and it does appear that the patient may need to have an additional x-ray just to ensure everything is cleared and that he has no airspace disease prior to putting him into the [name]. 03/05/2019; patient was readmitted to the clinic last week. He was hospitalized twice for a viral upper respiratory tract infection from 11/1 through 11/4 and then 11/5 through 11/12 ultimately this turned out to be Covid pneumonitis. Although he was discharged on oxygen he is not using it. He says he feels fine. He has no exercise limitation no cough no sputum. His O2 sat in our clinic today was 100% on room air. 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 He received Vanco and Zosyn in the hospital and then was discharged on 2 weeks of Augmentin. I do not see any relevant cultures. He was supposed to follow-up with infectious  disease but I do not see that he has an appointment. 12/8; patient saw Dr. Novella Olive of infectious disease last week. He felt that he had had adequate antibiotic therapy. He did not go to follow-up with Dr. Amalia Hailey of podiatry and I have again talked to him about the pros and cons of this. He does not want to consider a ray amputation of this time. He is aware of the risks of recurrence, migration etc. He started HBO today and tolerated this well. He can complete the Augmentin that I gave him last week. I have looked over the lab work that Dr. Chana Bode ordered his C-reactive protein was 3.3 and his sedimentation rate was 17. The C-reactive protein is never really been measurably that high in this patient 12/15; not much change in the wound today however he has undermining along the lateral part of the foot again more extensively than last week. He has some rims of epithelialization. We have been using silver alginate. He is undergoing hyperbarics but did not dive today 12/18; in for his obligatory first total contact cast change. Unfortunately there was pus coming from the undermining area around his  fifth metatarsal head. This was cultured but will preclude reapplication of a cast. He is seen in conjunction with HBO 12/24; patient had staph lugdunensis in the wound in the undermining area laterally last time. We put him on doxycycline which should have covered this. The wound looks better today. I am going to give him another week of doxycycline before reattempting the total contact cast 12/31; the patient is completing antibiotics. Hemorrhagic debris in the distal part of the wound with some undermining distally. He also had hyper granulation. Extensive debridement with a #5 curette. The infected area that was on the lateral part of the fifth met head is closed over. I do not think he needs any more antibiotics. Patient was seen prior to HBO. Preparations for a total contact cast were made in the cast will  be placed post hyperbarics 04/11/19; once again the patient arrives today without complaint. He had been in a cast all week noted that he had heavy drainage this week. This resulted in large raised areas of macerated tissue around the wound 1/14; wound bed looks better slightly smaller. Hydrofera Blue has been changing himself. He had a heavy drainage last week which caused a lot of maceration around the wound so I took him out of a total contact cast he says the drainage is actually better this week He is seen today in conjunction with HBO 1/21; returns to clinic. He was up in [place] for a day or 2 attending a funeral. He comes back in with the wound larger and with a large area of exposed bone. He had osteomyelitis and septic arthritis of the fifth left metatarsal head while he was in hospital. He received IV antibiotics in the hospital for a prolonged period of time then 3 weeks of Augmentin. Subsequently I gave him 2 weeks of doxycycline for more superficial wound infection. When I saw this last week the wound was smaller the surface of the wound looks satisfactory. 1/28; patient missed hyperbarics today. Bone biopsy I did last time showed Enterococcus faecalis and Staphylococcus lugdunensis . He has a wide area of exposed bone. We are going to use silver alginate as of today. I had another ethical discussion with the patient. 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 In this situation I think the likelihood of healing this is low. Therefore I have recommended a ray amputation and with the patient's agreement I have referred him to Dr. Doran Durand. The other issue is that his compliance with hyperbarics has been minimal because of his work schedule and given his underlying decision I am going to stop this today READMISSION 10/24/2019 MRI 09/29/2019 left foot IMPRESSION: 1. Apparent skin ulceration inferior and lateral to the 5th metatarsal base with underlying  heterogeneous T2 signal and enhancement in the subcutaneous fat. Small peripherally enhancing fluid collections along the plantar and lateral aspects of the 5th metatarsal base suspicious for abscesses. 2. Interval amputation through the mid 5th metatarsal with nonspecific low-level marrow edema and enhancement. Given the proximity to the adjacent soft tissue inflammatory changes, osteomyelitis cannot be excluded. 3. The additional bones appear unremarkable. MRI 09/29/2019 right foot IMPRESSION: 1. Soft tissue ulceration lateral to the 5th MTP joint. There is low-level T2 hyperintensity within the 4th and 5th metatarsal heads and adjacent proximal phalanges without abnormal T1 signal or cortical destruction. These findings are nonspecific and could be seen with early marrow edema, hyperemia or early osteomyelitis. No evidence of septic joint. 2. Mild tenosynovitis and synovial  enhancement associated with the extensor digitorum tendons at the level of the midfoot. 3. Diffuse low-level muscular T2 hyperintensity and enhancement, most consistent with diabetic myopathy. LEFT FOOT BONE Methicillin resistant staphylococcus aureus Staphylococcus lugdunensis MIC MIC CIPROFLOXACIN >=8 RESISTANT Resistant <=0.5 SENSI... Sensitive CLINDAMYCIN <=0.25 SENS... Sensitive >=8 RESISTANT Resistant ERYTHROMYCIN >=8 RESISTANT Resistant >=8 RESISTANT Resistant GENTAMICIN <=0.5 SENSI... Sensitive <=0.5 SENSI... Sensitive Inducible Clindamycin NEGATIVE Sensitive NEGATIVE Sensitive OXACILLIN >=4 RESISTANT Resistant 2 SENSITIVE Sensitive RIFAMPIN <=0.5 SENSI... Sensitive <=0.5 SENSI... Sensitive TETRACYCLINE <=1 SENSITIVE Sensitive <=1 SENSITIVE Sensitive TRIMETH/SULFA <=10 SENSIT Sensitive <=10 SENSIT Sensitive ... Marland Kitchen.. VANCOMYCIN 1 SENSITIVE Sensitive <=0.5 SENSI... Sensitive Right foot bone . Component 3 wk ago Specimen Description BONE Special Requests RIGHT 4 METATARSAL SAMPLE B Gram Stain NO WBC  SEEN NO ORGANISMS SEEN Culture RARE METHICILLIN RESISTANT STAPHYLOCOCCUS AUREUS NO ANAEROBES ISOLATED Performed at [facility], [place] [address] [place], [place] [address] Report Status 10/08/2019 FINAL Organism ID, Bacteria METHICILLIN RESISTANT STAPHYLOCOCCUS AUREUS Resulting Agency [facility] Susceptibility Methicillin resistant staphylococcus aureus MIC CIPROFLOXACIN >=8 RESISTANT Resistant CLINDAMYCIN <=0.25 SENS... Sensitive ERYTHROMYCIN >=8 RESISTANT Resistant GENTAMICIN <=0.5 SENSI... Sensitive Inducible Clindamycin NEGATIVE Sensitive OXACILLIN >=4 RESISTANT Resistant RIFAMPIN <=0.5 SENSI... Sensitive TETRACYCLINE <=1 SENSITIVE Sensitive TRIMETH/SULFA <=10 SENSIT Sensitive ... VANCOMYCIN 1 SENSITIVE Sensitive This is a patient we had in clinic earlier this year with a wound over his left fifth metatarsal head. 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 Doran Durand on 05/16/2019. He seems to have developed problems with wounds on his bilateral feet in June 2021 although he may have had problems earlier than this. He was in an urgent care with a right foot ulcer on 09/26/2019 and given a course of doxycycline. This was apparently after having trouble getting into see orthopedics. He was seen by podiatry on 09/28/2019 noted to have bilateral lower extremity ulcers including the left lateral fifth metatarsal base and the right subfifth met head. It was noted that had purulent drainage at that time. He required hospitalization from 6/20 through 7/2. This was because of worsening right foot wounds. He underwent bilateral operative incision and drainage and bone biopsies bilaterally. Culture results are  listed above. He has been referred back to clinic by Dr. Jacqualyn Posey of podiatry. He is also followed by Dr. Megan Salon who saw him yesterday. He was discharged from hospital on Zyvox Flagyl and Levaquin and yesterday changed to doxycycline Flagyl and Levaquin. His inflammatory markers on 6/26 showed a sedimentation rate of 129 and a C-reactive protein of 5. This is improved to 14 and 1.3 respectively. This would indicate improvement. ABIs in our clinic today were 1.23 on the right and 1.20 on the left 11/01/2019 on evaluation today patient appears to be doing fairly well in regard to the wounds on his feet at this point. Fortunately there is no signs of active infection at this time. No fevers, chills, nausea, vomiting, or diarrhea. He currently is seeing infectious disease and still under their care at this point. Subsequently he also has both wounds which she has not been using collagen on as he did not receive that in his packaging he did not call us and let us know that. Apparently that just was missed on the order. Nonetheless we will get  that straightened out today. 8/9-Patient returns for bilateral foot wounds, using Prisma with hydrogel moistened dressings, and the wounds appear stable. Patient using surgical shoes, avoiding much pressure or weightbearing as much as possible 8/16; patient has bilateral foot wounds. 1 on the right lateral foot proximally the other is on the left mid lateral foot. Both required debridement of callus and thick skin around the wounds. We have been using silver collagen 8/27; patient has bilateral lateral foot wounds. The area on the left substantially surrounded by callus and dry skin. This was removed from the wound edge. The underlying wound is small. The area on the right measured somewhat smaller today. 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 Unfortunately I did not update his antibiotics during  today's visit. 9/10 I reviewed Dr. Hale Bogus last notes he felt he had completed antibiotics his inflammatory markers were reasonably well controlled. He has a small wound on the lateral left foot and a tiny area on the right which is just above closed. He is using Hydrofera Blue with border foam he has bilateral surgical shoes 9/24; 2 week f/u. doing well. right foot is closed. left foot still undermined. 10/14; right foot remains closed at the fifth met head. The area over the base of the left fifth metatarsal has a small open area but considerable undermining towards the plantar foot. Thick callus skin around this suggests an adequate pressure relief. We have talked about this. He says he is going to go back into his cam boot. I suggested a total contact cast he did not seem enamored with this suggestion 10/26; left foot base of the fifth metatarsal. Same condition as last time. He has skin over the area with an open wound however the skin is not adherent. He went to see Dr. Earleen Newport who did an x-ray and culture of his foot I have not reviewed the x-ray but the patient was not told anything. He is on doxycycline 11/11; since the patient was last here he was in the emergency room on 10/30 he was concerned about swelling in the left foot. They did not do any cultures or x-rays. They changed his antibiotics to cephalexin. Previous culture showed group B strep. The cephalexin is appropriate as doxycycline has less than predictable coverage. Arrives in clinic today with swelling over this area under the wound. He also has a new wound on the right fifth metatarsal head 11/18; the patient has a difficult wound on the lateral aspect of the left fifth metatarsal head. The wound was almost ballotable last week I opened it slightly expecting to see purulence however there was just bleeding. I cultured this this was negative. X-ray unchanged. We are trying to get an MRI but I am not sure were going to be able to  get this through his insurance. He also has an area on the right lateral fifth metatarsal head this looks healthier 12/3; the patient finally got our MRI. Surprisingly this did not show osteomyelitis. I did show the soft tissue ulceration at the lateral plantar aspect of the fifth metatarsal base with a tiny residual 6 mm abscess overlying the superficial fascia I have tried to culture this area I have not been able to get this to grow anything. Nevertheless the protruding tissue looks aggravated. I suspect we should try to treat the underlying "abscess with broad-spectrum antibiotics. I am going to start him on Levaquin and Flagyl. He has much less edema  in his legs and I am going to continue to wrap his legs and see him weekly 12/10. I started Levaquin and Flagyl on him last week. He just picked up the Flagyl apparently there was some delay. The worry is the wound on the left fifth metatarsal base which is substantial and worsening. His foot looks like he inverts at the ankle making this a weightbearing surface. Certainly no improvement in fact I think the measurements of this are somewhat worse. We have been using 12/17; he apparently just got the Levaquin yesterday this is 2 weeks after the fact. He has completed the Flagyl. The area over the left fifth metatarsal base still has protruding granulation tissue although it does not look quite as bad as it did some weeks ago. He has severe bilateral lymphedema although we have not been treating him for wounds on his legs this is definitely going to require compression. There was so much edema in the left I did not wish to put him in a total contact cast today. I am going to increase his compression from 3-4 layer. The area on the right lateral fifth met head actually look quite good and superficial. 12/23; patient arrived with callus on the right fifth met head and the substantial hyper granulated callused wound on the base of his fifth metatarsal. He  says he is completing his Levaquin in 2 days but I do not think that adds up with what I gave him but I will have to double check this. We are using Hydrofera Blue on both areas. My plan is to put the left leg in a cast the week after New Year's 04/06/2020; patient's wounds about the same. Right lateral fifth metatarsal head and left lateral foot over the base of the fifth metatarsal. There is undermining on the left lateral foot which I removed before application of total contact cast continuing with Hydrofera Blue new. Patient tells me he was seen by endocrinology today lab work was done [Dr. Kerr]. Also wondering whether he was referred to cardiology. I went over some lab work from previously does not have chronic renal failure certainly not nephrotic range proteinuria he does have very poorly controlled diabetes but this is not his most updated lab work. Hemoglobin A1c has been over 11 1/10; the patient had a considerable amount of leakage towards mid part of his left foot with macerated skin however the wound surface looks better the area on the right lateral fifth met head is better as well. I am going to change the dressing on the left foot under the total contact cast to silver alginate, continue with Hydrofera Blue on the right. 1/20; patient was in the total contact cast for 10 days. Considerable amount of drainage although the skin around the wound does not look too bad on the left foot. The area on the right fifth metatarsal head is closed. Our nursing staff reports large amount of drainage out of the left lateral foot wound 1/25; continues with copious amounts of drainage described by our intake staff. PCR culture I did last week showed E. coli and Enterococcus faecalis and low quantities. Multiple resistance genes documented including extended spectrum beta lactamase, MRSA, MRSE, quinolone, tetracycline. The wound is not quite as good this week as it was 5 days ago but about the same  size 2/3; continues with copious amounts of malodorous drainage per our intake nurse. The PCR culture I did 2 weeks ago showed E. coli and low quantities of Enterococcus.  There were multiple resistance genes detected. I put Neosporin on him last week although this does not seem to have helped. The wound is slightly deeper today. Offloading continues to be an issue here although with the amount of drainage she has a total contact cast is just not going to work 2/10; moderate amount of drainage. Patient reports he cannot get his stocking on over the dressing. I told him we have to do that the nurse gave him suggestions on how to make this work. The wound is on the bottom and lateral part of his left foot. Is cultured predominantly grew low amounts of Enterococcus, E. coli and anaerobes. There were multiple resistance genes detected including extended spectrum beta lactamase, quinolone, tetracycline. I could not think of an easy oral combination to address this so for now I am going to do topical antibiotics provided by [facility] I think the main agents here are vancomycin and an aminoglycoside. We have to be able to give him access to the wounds to get the topical antibiotic on 2/17; moderate amount of drainage this is unchanged. He has his Keystone topical antibiotic against the deep tissue culture organisms. He has been using this and changing the dressing daily. Silver alginate on the wound surface. 2/24; using Keystone antibiotic with silver alginate on the top. He had too much drainage for a total contact cast at one point although I think that is improving and I think in the next week or 2 it might be possible to replace a total contact cast I did not do this today. In general the wound surface looks healthy however he continues to have thick rims of skin and subcutaneous tissue around the wide area of the circumference which I debrided 06/04/2020 upon evaluation today patient appears to be doing well  in regard to his wound. I do feel like he is showing signs of improvement. There is little bit of callus and dead tissue around the edges of the wound as well as what appears to be a little bit of a sinus tract that is off to the side laterally I would perform debridement to clear that away today. 3/17; left lateral foot. The wound looks about the same as I remember. Not much depth surface looks healthy. No evidence of infection 3/25; left lateral foot. Wound surface looks about the same. Separating epithelium from the circumference. There really is no evidence of infection here however not making progress by my view 3/29; left lateral foot. Surface of the wound again looks reasonably healthy still thick skin and subcutaneous tissue around the wound margins. There is no evidence of infection. One of the concerns being brought up by the nurses has again the amount of drainage vis--vis continued use of a total contact cast 4/5; left lateral foot at roughly the base of the fifth metatarsal. Nice healthy looking granulated tissue with rims of epithelialization. The overall wound measurements are not any better but the tissue looks healthy. The only concern is the amount of drainage although he has no surrounding maceration with what we have been doing recently to absorb fluid and protect his skin. He also has lymphedema. 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 He states that the kerlix/coban under the  TCC rolled down and could not get it back up. He has been using an offloading scooter and has somehow hurt his right foot using this device. This happened last week. He states that the side of his right foot developed a blister and opened. The top of his foot  also has a few small open wounds he thinks is due to his socks rubbing in his shoes. He has not been using any dressings to the wound. He denies purulent drainage, fever/chills or erythema to the wounds. 4/22; patient presents for 1 week follow-up. He developed new wounds to the right foot that were evaluated at last clinic visit. He continues to have a total contact cast to the left leg and he reports no issues. He has been using silver collagen to the right foot wounds with no issues. He denies purulent drainage, fever/chills or erythema to the right foot wounds. He has no complaints today 4/25; patient presents for 1 week follow-up. He has a total contact cast of the left leg and reports no issues. He has been using silver alginate to the right foot wound. He denies purulent drainage, fever/chills or erythema to the right foot wounds. 5/2 patient presents for 1 week follow-up. T contact cast on the left. The wound which is on the base of the plantar foot at the base of the fifth metatarsal otal actually looks quite good and dimensions continue to gradually contract. HOWEVER the area on the right lateral fifth metatarsal head is much larger than what I remember from 2 weeks ago. Once more is he has significant levels of hypergranulation. Noteworthy that he had this same hyper granulated response on his wound on the left foot at one point in time. So much so that he I thought there was an underlying fluid collection. Based on this I think this just needs debridement. 5/9; the wound on the left actually continues to be gradually smaller with a healthy surface. Slight amount of drainage and maceration of the skin around but not too bad. However he has a large wound over the right fifth metatarsal head very much in the same configuration as his left foot wound was initially. I used silver nitrate to address the hyper granulated tissue no mechanical debridement 5/16; area on the left foot did not look  as healthy this week deeper thick surrounding macerated skin and subcutaneous tissue. oo The area on the right foot fifth met head was about the same oo The area on the right ankle that we identified last week is completely broken down into an open wound presumably a stocking rubbing issue 5/23; patient has been using a total contact cast to the left side. He has been using silver alginate underneath. He has also been using silver alginate to the right foot wounds. He has no complaints today. He denies any signs of infection. 5/31; the left-sided wound looks some better measure smaller surface granulation looks better. We have been using silver alginate under the total contact cast oo The large area on his right fifth met head and right dorsal foot look about the same still using silver alginate 6/6; neither side is good as I was hoping although the surface area dimensions are better. A lot of maceration on his left and right foot around the wound edge. Area on the dorsal right foot looks better. He says he was traveling. I am not sure what does the amount of maceration around the plantar wounds may be drainage issues 6/13; in general  the wound surfaces look quite good on both sides. Macerated skin and raised edges around the wound required debridement although in general especially on the left the surface area seems improved. oo The area on the right dorsal ankle is about the same I thought this would not be such a problem to close 6/20; not much change in either wound although the one on the right looks a little better. Both wounds have thick macerated edges to the skin requiring debridements. We have been using silver alginate. The area on the dorsal right ankle is still open I thought this would be closed. 6/28; patient comes in today with a marked deterioration in the right foot wound fifth met head. Wide area of exposed bone this is a drastic change from last time. The area on the left  there we have been casting is stagnant. We have been using silver alginate in both wound areas. 7/25; bone culture I did for PCR last time was positive for Pseudomonas, group B strep, Enterococcus and Staph aureus. There was no suggestion of methicillin resistance or ampicillin resistant genes. This was resistant to tetracycline however He comes into the clinic today with the area over his right plantar fifth metatarsal head which had been doing so well 2 weeks ago completely necrotic feeling bone. I do not know that this is going to be salvageable. The left foot wound is certainly no smaller but it has a better surface and is superficial. Objective Constitutional Sitting or standing Blood Pressure is within target range for patient.. Pulse regular and within target range for patient.Marland Kitchen Respirations regular, non-labored and within target range.. Temperature is normal and within the target range for the patient.Marland Kitchen Appears in no distress. Vitals Time Taken: 4:31 PM, Height: 77 in, Weight: 280 lbs, BMI: 33.2, Temperature: 99.8 F, Pulse: 102 bpm, Respiratory Rate: 18 breaths/min, Blood Pressure: 123/85 mmHg, Capillary Blood Glucose: 148 mg/dl. General Notes: glucose per pt report General Notes: Wound exam; oo On the left the wound on the left plantar first MTP is about the same. Still not a completely viable surface and still some thick edges around the wound I used a #5 curette to debride this. Although is disappointing that the dimensions are not coming down the wound surface looks somewhat better to me oo Unfortunately on the right the area on the fifth MTP is down to bone. The bone itself does not feel viable. Certainly I think this man has osteomyelitis based not just on my culture but clinical exam. Integumentary (Hair, Skin) Wound #3 status is Open. Original cause of wound was Trauma. The date acquired was: 10/02/2019. The wound has been in treatment 49 weeks. The wound is located on the  Left,Lateral Foot. The wound measures 2.5cm length x 2.5cm width x 0.2cm depth; 4.909cm^2 area and 0.982cm^3 volume. There is Fat Layer (Subcutaneous Tissue) exposed. There is no tunneling or undermining noted. There is a large amount of serosanguineous drainage noted. The wound margin is thickened. There is large (67-100%) pink granulation within the wound bed. There is a small (1-33%) amount of necrotic tissue within the wound bed including Adherent Slough. Wound #5 status is Open. Original cause of wound was Gradually Appeared. The date acquired was: 07/07/2020. The wound has been in treatment 12 weeks. The wound is located on the Right,Lateral Metatarsal head fifth. The wound measures 2cm length x 2.5cm width x 1.3cm depth; 3.927cm^2 area and 5.105cm^3 volume. There is bone and Fat Layer (Subcutaneous Tissue) exposed. There is no tunneling  noted, however, there is undermining starting at 6:00 and ending at 12:00 with a maximum distance of 1.5cm. There is a medium amount of serosanguineous drainage noted. The wound margin is distinct with the outline attached to the wound base. There is medium (34-66%) pink granulation within the wound bed. There is a medium (34-66%) amount of necrotic tissue within the wound bed including Adherent Slough. Wound #7 status is Open. Original cause of wound was Gradually Appeared. The date acquired was: 08/17/2020. The wound has been in treatment 7 weeks. The wound is located on the Right,Anterior Ankle. The wound measures 0.8cm length x 0.3cm width x 0.1cm depth; 0.188cm^2 area and 0.019cm^3 volume. There is Fat Layer (Subcutaneous Tissue) exposed. There is no tunneling or undermining noted. There is a medium amount of serosanguineous drainage noted. The wound margin is distinct with the outline attached to the wound base. There is large (67-100%) red granulation within the wound bed. There is a small (1-33%) amount of necrotic tissue within the wound bed including  Adherent Slough. 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 There was a Excisional Skin/Subcutaneous Tissue Debridement with a total area of 6.25 sq cm performed by [name], MD. With the following instrument(s): Curette to remove Non-Viable tissue/material. Material removed includes Subcutaneous Tissue after achieving pain control using Other (Benzocaine). No specimens were taken. A time out was conducted at 17:24, prior to the start of the procedure. A Moderate amount of bleeding was controlled with Pressure. The procedure was tolerated well. Post Debridement Measurements: 2.5cm length x 2.5cm width x 0.2cm depth; 0.982cm^3 volume. Character of Wound/Ulcer Post Debridement is stable. Severity of Tissue Post Debridement is: Fat layer exposed. Post procedure Diagnosis Wound #3: Same as Pre-Procedure Pre-procedure diagnosis of Wound #3 is a Diabetic Wound/Ulcer of the Lower Extremity located on the Left,Lateral Foot . There was a T Contact Cast otal Procedure by [name], MD. Post procedure Diagnosis Wound #3: Same as Pre-Procedure Plan Follow-up Appointments: Return Appointment in 1 week. - ***60 minutes for cast*** - with Dr. Arcola Jansky Shower/ Hygiene: May shower with protection but do not get wound dressing(s) wet. - use a cast protector to left leg. Edema Control - Lymphedema / SCD / Other: Elevate legs to the level of the heart or above for 30 minutes daily and/or when sitting, a frequency of: -  throughout the day Avoid standing for long periods of time. Exercise regularly Moisturize legs daily. - right leg every night before bed. Compression stocking or Garment 20-30 mm/Hg pressure to: - Apply to right leg in the morning and remove at night. Off-Loading: T Contact Cast to Left Lower Extremity otal Open toe surgical shoe to: - right foot Consults ordered were: Infectious Disease - ASAP: Rapidly Declining Right Foot Wound WOUND #3: - Foot Wound Laterality: Left, Lateral Cleanser: Soap and Water 1 x Per Week/30 Days Discharge Instructions: May shower and wash wound with dial antibacterial soap and water prior to dressing change. Peri-Wound Care: Zinc Oxide Ointment 30g tube 1 x Per Week/30 Days Discharge Instructions: Apply Zinc Oxide to periwound with  each dressing change as needed. Prim Dressing: Hydrofera Blue Classic Foam, 2x2 in 1 x Per Week/30 Days ary Discharge Instructions: Moisten with saline prior to applying to wound bed Secondary Dressing: Woven Gauze Sponge, Non-Sterile 4x4 in 1 x Per Week/30 Days Discharge Instructions: Apply over primary dressing as directed. Secondary Dressing: ABD Pad, 8x10 1 x Per Week/30 Days Discharge Instructions: T down before applying kerlix. Apply over primary dressing as directed. ape Secondary Dressing: Zetuvit Plus 4x8 in 1 x Per Week/30 Days Discharge Instructions: Apply over primary dressing as directed. Secured With: [employer], 4.5x3.1 (in/yd) 1 x Per Week/30 Days Discharge Instructions: Secure with Kerlix as directed. Com pression Wrap: Kerlix Roll 4.5x3.1 (in/yd) 1 x Per Week/30 Days Discharge Instructions: Apply Kerlix and Coban compression under TCC. Com pression Wrap: Coban Self-Adherent Wrap 4x5 (in/yd) 1 x Per Week/30 Days Discharge Instructions: Apply over Kerlix as directed. WOUND #5: - Metatarsal head fifth Wound Laterality: Right, Lateral Cleanser: Normal Saline (Generic) Every Other Day/15 Days Discharge  Instructions: Cleanse the wound with Normal Saline prior to applying a clean dressing using gauze sponges, not tissue or cotton balls. Cleanser: Soap and Water Every Other Day/15 Days Discharge Instructions: May shower and wash wound with dial antibacterial soap and water prior to dressing change. Prim Dressing: KerraCel Ag Gelling Fiber Dressing, 2x2 in (silver alginate) (Generic) Every Other Day/15 Days ary Discharge Instructions: Apply silver alginate to wound bed as instructed Secondary Dressing: ABD Pad, 5x9 (Generic) Every Other Day/15 Days Discharge Instructions: Apply over primary dressing as directed. Secondary Dressing: Optifoam Non-Adhesive Dressing, 4x4 in (Generic) Every Other Day/15 Days Discharge Instructions: Apply over primary dressing, cut to make foam donut to offload Secured With: Kerlix Roll Sterile, 4.5x3.1 (in/yd) (Generic) Every Other Day/15 Days Discharge Instructions: Secure with Kerlix as directed. Secured With: 36M Medipore H Soft Cloth Surgical T ape, 2x2 (in/yd) (Generic) Every Other Day/15 Days Discharge Instructions: Secure dressing with tape as directed. WOUND #7: - Ankle Wound Laterality: Right, Anterior Cleanser: Normal Saline (Generic) Every Other Day/15 Days Discharge Instructions: Cleanse the wound with Normal Saline prior to applying a clean dressing using gauze sponges, not tissue or cotton balls. Cleanser: Soap and Water Every Other Day/15 Days Discharge Instructions: May shower and wash wound with dial antibacterial soap and water prior to dressing change. Prim Dressing: KerraCel Ag Gelling Fiber Dressing, 2x2 in (silver alginate) (Generic) Every Other Day/15 Days ary Discharge Instructions: Apply silver alginate to wound bed as instructed Secondary Dressing: Woven Gauze Sponge, Non-Sterile 4x4 in (Generic) Every Other Day/15 Days Discharge Instructions: Apply over primary dressing as directed. Secondary Dressing: ABD Pad, 5x9 (Generic) Every Other  Day/15 Days Discharge Instructions: Apply over primary dressing as directed. Secured With: [employer], 4.5x3.1 (in/yd) (Generic) Every Other Day/15 Days Discharge Instructions: Secure with Kerlix as directed. Secured With: 36M Medipore H Soft Cloth Surgical T ape, 2x2 (in/yd) (Generic) Every Other Day/15 Days Discharge Instructions: Secure dressing with tape as directed. 1. I still have cautious optimism about the left foot. Changed his dressing to [facility] under the total contact cast 2. Unfortunately I have no optimism about the wound on the right foot I think you will need a right fifth ray amputation he has osteomyelitis based on my bone biopsy from last week. At this point I do not think he needs an MRI 3. 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 I think he either needs to go to infectious disease and get hyperbaric oxygen or needs to be referred for consideration of a ray amputation on the right foot. Still using silver alginate here. 4. Based on my culture from last time I put him on Augmentin and ciprofloxacin. I think he would need IV antibiotics and I am referring him to infectious disease. I have asked the patient to get back to [name] if he wants to proceed the surgical route we will cancel the infectious disease and send him back to [facility] I believe] Electronic Signature(s) Signed: 10/07/2020 1:36:13 PM By: [name] MD Entered By: [name] on 10/06/2020 18:05:53 -------------------------------------------------------------------------------- Total Contact Cast Details Patient Name: Date of Service: [name], [name] 10/06/2020 3:00 PM Medical Record Number: [id number] Patient Account Number: [id number] Date of Birth/Sex: Treating RN: 09/27/1985 (35 y.o. [name] Primary Care Provider: [name] Other  Clinician: Referring Provider: Treating Provider/Extender: [name] in Treatment: 49 T Contact Cast Applied for Wound Assessment: otal Wound #3 Left,Lateral Foot Performed By: Physician [name], MD Post Procedure Diagnosis Same as Pre-procedure Electronic Signature(s) Signed: 10/07/2020 1:36:13 PM By: [name] MD Entered By: [name] on 10/06/2020 17:54:10 -------------------------------------------------------------------------------- SuperBill Details Patient Name: Date of Service: [name], [name] 10/06/2020 Medical Record Number: [id number] Patient Account Number: [id number] Date of Birth/Sex: Treating RN: 31-Oct-1985 (35 y.o. 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

## 2020-10-09 ENCOUNTER — Encounter (HOSPITAL_BASED_OUTPATIENT_CLINIC_OR_DEPARTMENT_OTHER): Payer: BC Managed Care – PPO | Admitting: Internal Medicine

## 2020-10-09 ENCOUNTER — Other Ambulatory Visit: Payer: Self-pay

## 2020-10-09 DIAGNOSIS — E11621 Type 2 diabetes mellitus with foot ulcer: Secondary | ICD-10-CM | POA: Diagnosis not present

## 2020-10-09 DIAGNOSIS — L97528 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ity: Secondary | ICD-10-CM | POA: Diagnosis not present

## 2020-10-09 NOTE — Progress Notes (Signed)
[name] ([id number]) Visit Report for 10/09/2020 Arrival Information Details Patient Name: Date of Service: [name] 10/09/2020 1:15 PM Medical Record Number: [id number] Patient Account Number: [id number] Date of Birth/Sex: Treating RN: [birth year] (35 y.o. [name] Primary Care Bryah Ocheltree: [name] Other Clinician: Referring Rifka Ramey: Treating Sharrieff Spratlin/Extender: [name] in Treatment: 55 Visit Information History Since Last Visit Added or deleted any medications: No Patient Arrived: Ambulatory Any new allergies or adverse reactions: No Arrival Time: 13:52 Had a fall or experienced change in No Transfer Assistance: None activities of daily living that may affect Patient Identification Verified: Yes risk of falls: Secondary Verification Process Completed: Yes Signs or symptoms of abuse/neglect since last visito No Patient Requires Transmission-Based Precautions: No Hospitalized since last visit: No Patient Has Alerts: No Implantable device outside of the clinic excluding No cellular tissue based products placed in the center since last visit: Has Dressing in Place as Prescribed: Yes Has Compression in Place as Prescribed: Yes Has Footwear/Offloading in Place as Prescribed: Yes Left: T Contact Cast otal Pain Present Now: No Electronic Signature(s) Signed: 10/09/2020 5:49:30 PM By: [name] Entered By: [name] on 10/09/2020 13:53:15 -------------------------------------------------------------------------------- Encounter Discharge Information Details Patient Name: Date of Service: [name], [name] 10/09/2020 1:15 PM Medical Record Number: [id number] Patient Account Number: [id number] Date of Birth/Sex: Treating RN: 05-Jul-1985 (35 y.o. [name], [name] Primary Care Brittanny Levenhagen: [name] Other Clinician: Referring Siobahn Worsley: Treating Kirstein Baxley/Extender: [name] in Treatment:  86 Encounter Discharge Information Items Discharge Condition: Stable Ambulatory Status: Ambulatory Discharge Destination: Home Transportation: Private Auto Accompanied By: self Schedule Follow-up Appointment: Yes Clinical Summary of Care: Patient Declined Electronic Signature(s) Signed: 10/09/2020 5:38:08 PM By: Rhae Hammock RN Entered By: Rhae Hammock on 10/09/2020 17:09:06 -------------------------------------------------------------------------------- Lower Extremity Assessment Details Patient Name: Date of Service: [name], [name] 10/09/2020 1:15 PM Medical Record Number: [id number] Patient Account Number: [id number] Date of Birth/Sex: Treating RN: 03-May-1985 (35 y.o. [name] Primary Care Viviane Semidey: [name] Other Clinician: Referring Muhamad Serano: Treating Larah Kuntzman/Extender: [name] in Treatment: 50 Edema Assessment Assessed: [name]: Yes] [name]: Yes] Edema: [Left: Yes] [Right: Yes] Calf Left: Right: Point of Measurement: 48 cm From Medial Instep 48 cm 47 cm Ankle Left: Right: Point of Measurement: 11 cm From Medial Instep 31 cm 30 cm Electronic Signature(s) Signed: 10/09/2020 5:49:30 PM By: [name] Entered By: [name] on 10/09/2020 13:54:05 -------------------------------------------------------------------------------- Multi Wound Chart Details Patient Name: Date of Service: [name], [name] 10/09/2020 1:15 PM Medical Record Number: [id number] Patient Account Number: [id number] Date of Birth/Sex: Treating RN: [birth year] (35 y.o. M) Primary Care [name]: [name] Other Clinician: Referring Avon Mergenthaler: Treating [name]/Extender: [name] in Treatment: 50 Vital Signs Height(in): 77 Pulse(bpm): 98 Weight(lbs): 280 Blood Pressure(mmHg): 130/82 Body Mass Index(BMI): 33 Temperature(F): 98.8 Respiratory Rate(breaths/min): 18 Photos: [3:No Photos Left, Lateral  Foot] [5:No Photos Right, Lateral Metatarsal head fifth] [7:No Photos Right, Anterior Ankle] Wound Location: [3:Trauma] [5:Gradually Appeared] [7:Gradually Appeared] Wounding Event: [3:Diabetic Wound/Ulcer of the Lower] [5:Diabetic Wound/Ulcer of the Lower] [7:Diabetic Wound/Ulcer of the Lower] Primary Etiology: [3:Extremity Type II Diabetes] [5:Extremity Type II Diabetes] [7:Extremity Type II Diabetes] Comorbid History: [3:10/02/2019] [5:07/07/2020] [7:08/17/2020] Date Acquired: [3:50] [5:12] [7:7] Weeks of Treatment: [3:Open] [5:Open] [7:Open] Wound Status: [3:2.5x2.5x0.2] [5:2x2.5x0.3] [7:0.8x0.3x0.1] Measurements L x W x D (cm) [3:4.909] [5:3.927] [7:0.188] A (cm) : rea [3:0.982] [5:1.178] [7:0.019] Volume (cm) : [3:-197.70%] [5:55.40%] [7:91.70%] % Reduction in A  rea: [3:-495.20%] [5:73.20%] [7:91.60%] % Reduction in Volume: [3:Grade 2] [5:Grade 2] [7:Grade 2] Classification: [3:Large] [5:Medium] [7:Medium] Exudate A mount: [3:Serosanguineous] [5:Serosanguineous] [7:Serosanguineous] Exudate Type: [3:red, brown] [5:red, brown] [7:red, brown] Exudate Color: [3:Thickened] [5:Distinct, outline attached] [7:Distinct, outline attached] Wound Margin: [3:Large (67-100%)] [5:Medium (34-66%)] [7:Large (67-100%)] Granulation Amount: [3:Pink] [5:Pink] [7:Red] Granulation Quality: [3:Small (1-33%)] [5:Medium (34-66%)] [7:Small (1-33%)] Necrotic Amount: [3:Fat Layer (Subcutaneous Tissue): Yes Fat Layer (Subcutaneous Tissue): Yes Fat Layer (Subcutaneous Tissue): Yes] Exposed Structures: [3:Fascia: No Tendon: No Muscle: No Joint: No Bone: No Small (1-33%)] [5:Bone: Yes Fascia: No Tendon: No Muscle: No Joint: No Small (1-33%)] [7:Fascia: No Tendon: No Muscle: No Joint: No Bone: No Large (67-100%)] Treatment Notes Electronic Signature(s) Signed: 10/09/2020 4:29:21 PM By: [name] DO Entered By: [name] on 10/09/2020  14:23:01 -------------------------------------------------------------------------------- Multi-Disciplinary Care Plan Details Patient Name: Date of Service: [name], [name] 10/09/2020 1:15 PM Medical Record Number: [id number] Patient Account Number: [id number] Date of Birth/Sex: Treating RN: 07/30/1985 (34 y.o. [name] Primary Care Trig Mcbryar: [name] Other Clinician: Referring Shatoya Roets: Treating Salome Hautala/Extender: [name] in Treatment: 45 Multidisciplinary Care Plan reviewed with physician Active Inactive Nutrition Nursing Diagnoses: Imbalanced nutrition Potential for alteratiion in Nutrition/Potential for imbalanced nutrition Goals: Patient/caregiver agrees to and verbalizes understanding of need to use nutritional supplements and/or vitamins as prescribed Date Initiated: 10/24/2019 Date Inactivated: 04/06/2020 Target Resolution Date: 04/03/2020 Goal Status: Met Patient/caregiver will maintain therapeutic glucose control Date Initiated: 10/24/2019 Target Resolution Date: 10/16/2020 Goal Status: Active Interventions: Assess HgA1c results as ordered upon admission and as needed Assess patient nutrition upon admission and as needed per policy Provide education on elevated blood sugars and impact on wound healing Provide education on nutrition Treatment Activities: Education provided on Nutrition : 07/21/2020 Notes: Wound/Skin Impairment Nursing Diagnoses: Impaired tissue integrity Knowledge deficit related to ulceration/compromised skin integrity Goals: Patient/caregiver will verbalize understanding of skin care regimen Date Initiated: 10/24/2019 Target Resolution Date: 10/16/2020 Goal Status: Active Ulcer/skin breakdown will have a volume reduction of 30% by week 4 Date Initiated: 10/24/2019 Date Inactivated: 01/16/2020 Target Resolution Date: 01/10/2020 Unmet Reason: no change in Goal Status:  Unmet measurements. Interventions: Assess patient/caregiver ability to obtain necessary supplies Assess patient/caregiver ability to perform ulcer/skin care regimen upon admission and as needed Assess ulceration(s) every visit Provide education on ulcer and skin care Notes: Electronic Signature(s) Signed: 10/09/2020 3:57:13 PM By: [name] Entered By: [name] on 10/09/2020 15:57:12 -------------------------------------------------------------------------------- Pain Assessment Details Patient Name: Date of Service: [name] [name] 10/09/2020 1:15 PM Medical Record Number: [id number] Patient Account Number: [id number] Date of Birth/Sex: Treating RN: 01/23/1986 (35 y.o. [name] Primary Care Mancel Lardizabal: [name] Other Clinician: Referring Remus Hagedorn: Treating Oluwasemilore Pascuzzi/Extender: [name] in Treatment: 50 Active Problems Location of Pain Severity and Description of Pain Patient Has Paino No Site Locations Pain Management and Medication Current Pain Management: Electronic Signature(s) Signed: 10/09/2020 5:49:30 PM By: [name] Entered By: [name] on 10/09/2020 13:53:53 -------------------------------------------------------------------------------- Patient/Caregiver Education Details Patient Name: Date of Service: [name] 7/8/2022andnbsp1:15 PM Medical Record Number: [id number] Patient Account Number: [id number] Date of Birth/Gender: Treating RN: [birth year] (35 y.o. [name] Primary Care Physician: [name] Other Clinician: Referring Physician: Treating Physician/Extender: [name] in Treatment: 110 Education Assessment Education Provided To: Patient Education Topics Provided Elevated Blood Sugar/ Impact on Healing: Methods: Explain/Verbal, Printed Responses: State content correctly Offloading: Methods: Explain/Verbal, Printed Responses: State content  correctly Wound/Skin Impairment:  Methods: Explain/Verbal, Printed Responses: State content correctly Electronic Signature(s) Signed: 10/09/2020 5:49:30 PM By: [name] Entered By: [name] on 10/09/2020 15:57:38 -------------------------------------------------------------------------------- Wound Assessment Details Patient Name: Date of Service: [name] 10/09/2020 1:15 PM Medical Record Number: [id number] Patient Account Number: [id number] Date of Birth/Sex: Treating RN: 01-30-1986 (35 y.o. [name] Primary Care Guilianna Mckoy: [name] Other Clinician: Referring Kodi Steil: Treating Sair Faulcon/Extender: [name] in Treatment: 50 Wound Status Wound Number: 3 Primary Etiology: Diabetic Wound/Ulcer of the Lower Extremity Wound Location: Left, Lateral Foot Wound Status: Open Wounding Event: Trauma Comorbid History: Type II Diabetes Date Acquired: 10/02/2019 Weeks Of Treatment: 50 Clustered Wound: No Wound Measurements Length: (cm) 2.5 Width: (cm) 2.5 Depth: (cm) 0.2 Area: (cm) 4.909 Volume: (cm) 0.982 % Reduction in Area: -197.7% % Reduction in Volume: -495.2% Epithelialization: Small (1-33%) Tunneling: No Undermining: No Wound Description Classification: Grade 2 Wound Margin: Thickened Exudate Amount: Large Exudate Type: Serosanguineous Exudate Color: red, brown Foul Odor After Cleansing: No Slough/Fibrino Yes Wound Bed Granulation Amount: Large (67-100%) Exposed Structure Granulation Quality: Pink Fascia Exposed: No Necrotic Amount: Small (1-33%) Fat Layer (Subcutaneous Tissue) Exposed: Yes Necrotic Quality: Adherent Slough Tendon Exposed: No Muscle Exposed: No Joint Exposed: No Bone Exposed: No Treatment Notes Wound #3 (Foot) Wound Laterality: Left, Lateral Cleanser Peri-Wound Care Topical Primary Dressing Secondary Dressing Secured With Compression Wrap Compression  Stockings Add-Ons Electronic Signature(s) Signed: 10/09/2020 5:49:30 PM By: [name] Entered By: [name] on 10/09/2020 13:54:27 -------------------------------------------------------------------------------- Wound Assessment Details Patient Name: Date of Service: [name], [name] 10/09/2020 1:15 PM Medical Record Number: [id number] Patient Account Number: [id number] Date of Birth/Sex: Treating RN: 1985/10/09 (35 y.o. [name] Primary Care Bama Hanselman: [name] Other Clinician: Referring Marianne Golightly: Treating Lomax Poehler/Extender: [name] in Treatment: 69 Wound Status Wound Number: 5 Primary Etiology: Diabetic Wound/Ulcer of the Lower Extremity Wound Location: Right, Lateral Metatarsal head fifth Wound Status: Open Wounding Event: Gradually Appeared Comorbid History: Type II Diabetes Date Acquired: 07/07/2020 Weeks Of Treatment: 12 Clustered Wound: No Wound Measurements Length: (cm) 2 Width: (cm) 2.5 Depth: (cm) 0.3 Area: (cm) 3.927 Volume: (cm) 1.178 % Reduction in Area: 55.4% % Reduction in Volume: 73.2% Epithelialization: Small (1-33%) Tunneling: No Undermining: No Wound Description Classification: Grade 2 Wound Margin: Distinct, outline attached Exudate Amount: Medium Exudate Type: Serosanguineous Exudate Color: red, brown Foul Odor After Cleansing: No Slough/Fibrino Yes Wound Bed Granulation Amount: Medium (34-66%) Exposed Structure Granulation Quality: Pink Fascia Exposed: No Necrotic Amount: Medium (34-66%) Fat Layer (Subcutaneous Tissue) Exposed: Yes Necrotic Quality: Adherent Slough Tendon Exposed: No Muscle Exposed: No Joint Exposed: No Bone Exposed: Yes Treatment Notes Wound #5 (Metatarsal head fifth) Wound Laterality: Right, Lateral Cleanser Peri-Wound Care Topical Primary Dressing Secondary Dressing Secured With Compression Wrap Compression Stockings Add-Ons Electronic  Signature(s) Signed: 10/09/2020 5:49:30 PM By: [name] Entered By: [name] on 10/09/2020 13:54:49 -------------------------------------------------------------------------------- Wound Assessment Details Patient Name: Date of Service: [name], [name] 10/09/2020 1:15 PM Medical Record Number: [id number] Patient Account Number: [id number] Date of Birth/Sex: Treating RN: 1986/04/04 (34 y.o. [name] Primary Care Zanayah Shadowens: [name] Other Clinician: Referring Ghadeer Kastelic: Treating Emoni Whitworth/Extender: [name] in Treatment: 50 Wound Status Wound Number: 7 Primary Etiology: Diabetic Wound/Ulcer of the Lower Extremity Wound Location: Right, Anterior Ankle Wound Status: Open Wounding Event: Gradually Appeared Comorbid History: Type II Diabetes Date Acquired: 08/17/2020 Weeks Of Treatment: 7 Clustered Wound: No Wound Measurements Length: (cm) 0.8 Width: (cm)  0.3 Depth: (cm) 0.1 Area: (cm) 0.188 Volume: (cm) 0.019 % Reduction in Area: 91.7% % Reduction in Volume: 91.6% Epithelialization: Large (67-100%) Tunneling: No Undermining: No Wound Description Classification: Grade 2 Wound Margin: Distinct, outline attached Exudate Amount: Medium Exudate Type: Serosanguineous Exudate Color: red, brown Foul Odor After Cleansing: No Slough/Fibrino Yes Wound Bed Granulation Amount: Large (67-100%) Exposed Structure Granulation Quality: Red Fascia Exposed: No Necrotic Amount: Small (1-33%) Fat Layer (Subcutaneous Tissue) Exposed: Yes Necrotic Quality: Adherent Slough Tendon Exposed: No Muscle Exposed: No Joint Exposed: No Bone Exposed: No Treatment Notes Wound #7 (Ankle) Wound Laterality: Right, Anterior Cleanser Peri-Wound Care Topical Primary Dressing Secondary Dressing Secured With Compression Wrap Compression Stockings Add-Ons Electronic Signature(s) Signed: 10/09/2020 5:49:30 PM By: [name] Entered By:  [name] on 10/09/2020 13:55:11 -------------------------------------------------------------------------------- Vitals Details Patient Name: Date of Service: [name], [name] 10/09/2020 1:15 PM Medical Record Number: [id number] Patient Account Number: [id number] Date of Birth/Sex: Treating RN: 1985/10/20 (35 y.o. [name] Primary Care Ysabella Babiarz: [name] Other Clinician: Referring Fizza Scales: Treating Koi Zangara/Extender: [name] in Treatment: 50 Vital Signs Time Taken: 13:45 Temperature (F): 98.8 Height (in): 77 Pulse (bpm): 98 Weight (lbs): 280 Respiratory Rate (breaths/min): 18 Body Mass Index (BMI): 33.2 Blood Pressure (mmHg): 130/82 Reference Range: 80 - 120 mg / dl Electronic Signature(s) Signed: 10/09/2020 5:49:30 PM By: [name] Entered By: [name] on 10/09/2020 13:53:46

## 2020-10-13 ENCOUNTER — Encounter (HOSPITAL_BASED_OUTPATIENT_CLINIC_OR_DEPARTMENT_OTHER): Payer: BC Managed Care – PPO | Admitting: Internal Medicine

## 2020-10-13 ENCOUNTER — Other Ambulatory Visit: Payer: Self-pay

## 2020-10-13 DIAGNOSIS — E11621 Type 2 diabetes mellitus with foot ulcer: Secondary | ICD-10-CM | POA: Diagnosis not present

## 2020-10-13 NOTE — Progress Notes (Signed)
[name], Max ([id number]) Visit Report for 10/09/2020 Chief Complaint Document Details Patient Name: Date of Service: [name] 10/09/2020 1:15 PM Medical Record Number: [id number] Patient Account Number: [id number] Date of Birth/Sex: Treating RN: 15-May-1985 (35 y.o. M) Primary Care Provider: [name] Other Clinician: Referring Provider: Treating Provider/Extender: [name] in Treatment: 20 Information Obtained from: Patient Chief Complaint 01/11/2019; patient is here for review of a rather substantial wound over the left fifth plantar metatarsal head extending into the lateral part of his foot 10/24/2019; patient returns to clinic with wounds on his bilateral feet with underlying osteomyelitis biopsy-proven Electronic Signature(s) Signed: 10/09/2020 4:29:21 PM By: [name] DO Entered By: [name] on 10/09/2020 14:23:15 -------------------------------------------------------------------------------- HPI Details Patient Name: Date of Service: [name], [name] 10/09/2020 1:15 PM Medical Record Number: [id number] Patient Account Number: [id number] Date of Birth/Sex: Treating RN: 20-Dec-1985 (35 y.o. 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 He comes in for review of a wound over the plantar fifth metatarsal head extending into the lateral part of the foot. He was followed for this previously by his podiatrist Dr. Cornelius Moras. As the patient tells his story he went to see podiatry first for a swelling he developed on the lateral part of his fifth metatarsal head in May. He states this was "open" by podiatry and the area closed. He was followed up in June and it was again opened  callus removed and it closed promptly. There were plans being made for surgery on the fifth metatarsal head in June however his blood sugar was apparently too high for anesthesia. Apparently the area was debrided and opened again in June and it is never closed since. Looking over the records from podiatry I am really not able to follow this. It was clear when he was first seen it was before 5/14 at that point he already had a wound. By 5/17 the ulcer was resolved. I do not see anything about a procedure. On 5/28 noted to have pre-ulcerative moderate keratosis. X-ray noted 1/5 contracted toe and tailor's bunion and metatarsal deformity. On a visit date on 09/28/2018 the dorsal part of the left foot it healed and resolved. There was concern about swelling in his lower extremity he was sent to the ER.. As far as I can tell he was seen in the ER on 7/12 with an ulcer on his left foot. A DVT rule out of the left leg was negative. I do not think I have complete records from podiatry but I am not able to verify the procedures this patient states he had. He states after the last procedure the wound has never closed although I am not able to follow this in the records I have from podiatry. He has not had a recent x-ray The patient has been using Neosporin on the wound. He is wearing a Darco shoe. He is still very active up on his foot working and exercising. Past medical history; type 2 diabetes ketosis-prone, leg swelling with a negative DVT study in July. Non-smoker ABI in our clinic was 0.85 on the left 10/16; substantial wound on the plantar left fifth met head extending laterally almost to the dorsal fifth MTP. We have been using silver alginate we gave him a Darco forefoot off  loader. An x-ray did not show evidence of osteomyelitis did note soft tissue emphysema which I think was due to gas tracking through an open wound. There is no doubt in my mind he requires an MRI 10/23; MRI not booked until 3  November at the earliest this is largely due to his glucose sensor in the right arm. We have been using silver alginate. There has been an improvement 10/29; I am still not exactly sure when his MRI is booked for. He says it is the third but it is the 10th in epic. This definitely needs to be done. He is running a low-grade fever today but no other symptoms. No real improvement in the 1 02/26/2019 patient presents today for a follow-up visit here in our clinic he is last been seen in the clinic on October 29. Subsequently we were working on getting MRI to evaluate and see what exactly was going on and where we would need to go from the standpoint of whether or not he had osteomyelitis and again what treatments were going be required. Subsequently the patient ended up being admitted to the hospital on 02/07/2019 and was discharged on 02/14/2019. This is a somewhat interesting admission with a discharge diagnosis of pneumonia due to COVID-19 although he was positive for COVID-19 when tested at the urgent care but negative x2 when he was actually in the hospital. With that being said he did have acute respiratory failure with hypoxia and it was noted he also have a left foot ulceration with osteomyelitis. With that being said he did require oxygen for his pneumonia and I level 4 L. He was placed on antivirals and steroids for the COVID-19. He was also transferred to the [facility] at one point. Nonetheless he did subsequently discharged home and since being home has done much better in that regard. The CT angiogram did not show any pulmonary embolism. With regard to the osteomyelitis the patient was placed on vancomycin and Zosyn while in the hospital but has been changed to Augmentin at discharge. It was also recommended that he follow- up with wound care and podiatry. Podiatry however wanted him to see [name] according to the patient prior to them doing anything further. His hemoglobin A1c was 9.9  as noted in the hospital. Have an MRI of the left foot performed while in the hospital on 02/04/2019. 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 There is an overlying plantar open wound noted an abscess tracking back along the lateral aspect of the fifth metatarsal shaft. There is otherwise diffuse cellulitis and mild fasciitis without findings of polymyositis. The patient did have recently pneumonia secondary to COVID-19 I looked in the chart through epic and it does appear that the patient may need to have an additional x-ray just to ensure everything is cleared and that he has no airspace disease prior to putting him into the [name]. 03/05/2019; patient was readmitted to the clinic last week. He was hospitalized twice for a viral upper respiratory tract infection from 11/1 through 11/4 and then 11/5 through 11/12 ultimately this turned out to be Covid pneumonitis. Although he was discharged on oxygen he is not using it. He says he feels fine. He has no exercise limitation no cough no sputum. His O2 sat in our clinic today was 100% on room air. He did manage to have his MRI which showed septic arthritis at the fifth MTP  joint and osteomyelitis involving the fifth metatarsal head and proximal phalanx. He received Vanco and Zosyn in the hospital and then was discharged on 2 weeks of Augmentin. I do not see any relevant cultures. He was supposed to follow-up with infectious disease but I do not see that he has an appointment. 12/8; patient saw Dr. Novella Olive of infectious disease last week. He felt that he had had adequate antibiotic therapy. He did not go to follow-up with Dr. Amalia Hailey of podiatry and I have again talked to him about the pros and cons of this. He does not want to consider a ray amputation of this time. He is aware of the risks of recurrence, migration etc. He started HBO today and tolerated this  well. He can complete the Augmentin that I gave him last week. I have looked over the lab work that Dr. Chana Bode ordered his C-reactive protein was 3.3 and his sedimentation rate was 17. The C-reactive protein is never really been measurably that high in this patient 12/15; not much change in the wound today however he has undermining along the lateral part of the foot again more extensively than last week. He has some rims of epithelialization. We have been using silver alginate. He is undergoing hyperbarics but did not dive today 12/18; in for his obligatory first total contact cast change. Unfortunately there was pus coming from the undermining area around his fifth metatarsal head. This was cultured but will preclude reapplication of a cast. He is seen in conjunction with HBO 12/24; patient had staph lugdunensis in the wound in the undermining area laterally last time. We put him on doxycycline which should have covered this. The wound looks better today. I am going to give him another week of doxycycline before reattempting the total contact cast 12/31; the patient is completing antibiotics. Hemorrhagic debris in the distal part of the wound with some undermining distally. He also had hyper granulation. Extensive debridement with a #5 curette. The infected area that was on the lateral part of the fifth met head is closed over. I do not think he needs any more antibiotics. Patient was seen prior to HBO. Preparations for a total contact cast were made in the cast will be placed post hyperbarics 04/11/19; once again the patient arrives today without complaint. He had been in a cast all week noted that he had heavy drainage this week. This resulted in large raised areas of macerated tissue around the wound 1/14; wound bed looks better slightly smaller. Hydrofera Blue has been changing himself. He had a heavy drainage last week which caused a lot of maceration around the wound so I took him out of a  total contact cast he says the drainage is actually better this week He is seen today in conjunction with HBO 1/21; returns to clinic. He was up in [place] for a day or 2 attending a funeral. He comes back in with the wound larger and with a large area of exposed bone. He had osteomyelitis and septic arthritis of the fifth left metatarsal head while he was in hospital. He received IV antibiotics in the hospital for a prolonged period of time then 3 weeks of Augmentin. Subsequently I gave him 2 weeks of doxycycline for more superficial wound infection. When I saw this last week the wound was smaller the surface of the wound looks satisfactory. 1/28; patient missed hyperbarics today. Bone biopsy I did last time showed Enterococcus faecalis and Staphylococcus lugdunensis . He has a wide area  of exposed bone. We are going to use silver alginate as of today. I had another ethical discussion with the patient. 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 In this situation I think the likelihood of healing this is low. Therefore I have recommended a ray amputation and with the patient's agreement I have referred him to Dr. Doran Durand. The other issue is that his compliance with hyperbarics has been minimal because of his work schedule and given his underlying decision I am going to stop this today READMISSION 10/24/2019 MRI 09/29/2019 left foot IMPRESSION: 1. Apparent skin ulceration inferior and lateral to the 5th metatarsal base with underlying heterogeneous T2 signal and enhancement in the subcutaneous fat. Small peripherally enhancing fluid collections along the plantar and lateral aspects of the 5th metatarsal base suspicious for abscesses. 2. Interval amputation through the mid 5th metatarsal with nonspecific low-level marrow edema and enhancement. Given the proximity to the adjacent soft tissue inflammatory changes, osteomyelitis cannot be excluded. 3. The  additional bones appear unremarkable. MRI 09/29/2019 right foot IMPRESSION: 1. Soft tissue ulceration lateral to the 5th MTP joint. There is low-level T2 hyperintensity within the 4th and 5th metatarsal heads and adjacent proximal phalanges without abnormal T1 signal or cortical destruction. These findings are nonspecific and could be seen with early marrow edema, hyperemia or early osteomyelitis. No evidence of septic joint. 2. Mild tenosynovitis and synovial enhancement associated with the extensor digitorum tendons at the level of the midfoot. 3. Diffuse low-level muscular T2 hyperintensity and enhancement, most consistent with diabetic myopathy. LEFT FOOT BONE Methicillin resistant staphylococcus aureus Staphylococcus lugdunensis MIC MIC CIPROFLOXACIN >=8 RESISTANT Resistant <=0.5 SENSI... Sensitive CLINDAMYCIN <=0.25 SENS... Sensitive >=8 RESISTANT Resistant ERYTHROMYCIN >=8 RESISTANT Resistant >=8 RESISTANT Resistant GENTAMICIN <=0.5 SENSI... Sensitive <=0.5 SENSI... Sensitive Inducible Clindamycin NEGATIVE Sensitive NEGATIVE Sensitive OXACILLIN >=4 RESISTANT Resistant 2 SENSITIVE Sensitive RIFAMPIN <=0.5 SENSI... Sensitive <=0.5 SENSI... Sensitive TETRACYCLINE <=1 SENSITIVE Sensitive <=1 SENSITIVE Sensitive TRIMETH/SULFA <=10 SENSIT Sensitive <=10 SENSIT Sensitive ... [name].. VANCOMYCIN 1 SENSITIVE Sensitive <=0.5 SENSI... Sensitive Right foot bone . Component 3 wk ago Specimen Description BONE Special Requests RIGHT 4 METATARSAL SAMPLE B Gram Stain NO WBC SEEN NO ORGANISMS SEEN Culture RARE METHICILLIN RESISTANT STAPHYLOCOCCUS AUREUS NO ANAEROBES ISOLATED Performed at [facility], [place] [address] [place], [place] [address] Report Status 10/08/2019 FINAL Organism ID, Bacteria METHICILLIN RESISTANT STAPHYLOCOCCUS AUREUS Resulting Agency [facility] Susceptibility Methicillin resistant staphylococcus aureus MIC CIPROFLOXACIN >=8 RESISTANT Resistant CLINDAMYCIN  <=0.25 SENS... Sensitive ERYTHROMYCIN >=8 RESISTANT Resistant GENTAMICIN <=0.5 SENSI... Sensitive Inducible Clindamycin NEGATIVE Sensitive OXACILLIN >=4 RESISTANT Resistant RIFAMPIN <=0.5 SENSI... Sensitive TETRACYCLINE <=1 SENSITIVE Sensitive TRIMETH/SULFA <=10 SENSIT Sensitive ... VANCOMYCIN 1 SENSITIVE Sensitive This is a patient we had in clinic earlier this year with a wound over his left fifth metatarsal head. 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 Doran Durand on 05/16/2019. He seems to have developed problems with wounds on his bilateral feet in June 2021 although he may have had problems earlier than this. He was in an urgent care with a right foot ulcer on 09/26/2019 and given a course of doxycycline. This was apparently after having trouble getting into see orthopedics. He was seen by podiatry on 09/28/2019 noted to have bilateral lower  extremity ulcers including the left lateral fifth metatarsal base and the right subfifth met head. It was noted that had purulent drainage at that time. He required hospitalization from 6/20 through 7/2. This was because of worsening right foot wounds. He underwent bilateral operative incision and drainage and bone biopsies bilaterally. Culture results are listed above. He has been referred back to clinic by Dr. Jacqualyn Posey of podiatry. He is also followed by Dr. Megan Salon who saw him yesterday. He was discharged from hospital on Zyvox Flagyl and Levaquin and yesterday changed to doxycycline Flagyl and Levaquin. His inflammatory markers on 6/26 showed a sedimentation rate of 129 and a C-reactive protein of 5. This is improved to 14 and 1.3 respectively. This would indicate  improvement. ABIs in our clinic today were 1.23 on the right and 1.20 on the left 11/01/2019 on evaluation today patient appears to be doing fairly well in regard to the wounds on his feet at this point. Fortunately there is no signs of active infection at this time. No fevers, chills, nausea, vomiting, or diarrhea. He currently is seeing infectious disease and still under their care at this point. Subsequently he also has both wounds which she has not been using collagen on as he did not receive that in his packaging he did not call us and let us know that. Apparently that just was missed on the order. Nonetheless we will get that straightened out today. 8/9-Patient returns for bilateral foot wounds, using Prisma with hydrogel moistened dressings, and the wounds appear stable. Patient using surgical shoes, avoiding much pressure or weightbearing as much as possible 8/16; patient has bilateral foot wounds. 1 on the right lateral foot proximally the other is on the left mid lateral foot. Both required debridement of callus and thick skin around the wounds. We have been using silver collagen 8/27; patient has bilateral lateral foot wounds. The area on the left substantially surrounded by callus and dry skin. This was removed from the wound edge. The underlying wound is small. The area on the right measured somewhat smaller today. 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 Unfortunately I did not update his antibiotics during today's visit. 9/10 I reviewed Dr. Hale Bogus last notes he felt he had completed antibiotics his inflammatory markers were reasonably well controlled. He has a small wound on the lateral left foot and a tiny area on the right which is just above closed. He is using Hydrofera Blue with border foam he has bilateral surgical shoes 9/24; 2 week f/u. doing well. right foot is closed. left foot still undermined. 10/14;  right foot remains closed at the fifth met head. The area over the base of the left fifth metatarsal has a small open area but considerable undermining towards the plantar foot. Thick callus skin around this suggests an adequate pressure relief. We have talked about this. He says he is going to go back into his cam boot. I suggested a total contact cast he did not seem enamored with this suggestion 10/26; left foot base of the fifth metatarsal. Same condition as last time. He has skin over the area with an open wound however the skin is not adherent. He went to see Dr. Earleen Newport who did an x-ray and culture of his foot I have not reviewed the x-ray but the patient was not told anything. He is on doxycycline 11/11; since the patient was last here he was in the emergency room on 10/30  he was concerned about swelling in the left foot. They did not do any cultures or x-rays. They changed his antibiotics to cephalexin. Previous culture showed group B strep. The cephalexin is appropriate as doxycycline has less than predictable coverage. Arrives in clinic today with swelling over this area under the wound. He also has a new wound on the right fifth metatarsal head 11/18; the patient has a difficult wound on the lateral aspect of the left fifth metatarsal head. The wound was almost ballotable last week I opened it slightly expecting to see purulence however there was just bleeding. I cultured this this was negative. X-ray unchanged. We are trying to get an MRI but I am not sure were going to be able to get this through his insurance. He also has an area on the right lateral fifth metatarsal head this looks healthier 12/3; the patient finally got our MRI. Surprisingly this did not show osteomyelitis. I did show the soft tissue ulceration at the lateral plantar aspect of the fifth metatarsal base with a tiny residual 6 mm abscess overlying the superficial fascia I have tried to culture this area I have not been  able to get this to grow anything. Nevertheless the protruding tissue looks aggravated. I suspect we should try to treat the underlying "abscess with broad-spectrum antibiotics. I am going to start him on Levaquin and Flagyl. He has much less edema in his legs and I am going to continue to wrap his legs and see him weekly 12/10. I started Levaquin and Flagyl on him last week. He just picked up the Flagyl apparently there was some delay. The worry is the wound on the left fifth metatarsal base which is substantial and worsening. His foot looks like he inverts at the ankle making this a weightbearing surface. Certainly no improvement in fact I think the measurements of this are somewhat worse. We have been using 12/17; he apparently just got the Levaquin yesterday this is 2 weeks after the fact. He has completed the Flagyl. The area over the left fifth metatarsal base still has protruding granulation tissue although it does not look quite as bad as it did some weeks ago. He has severe bilateral lymphedema although we have not been treating him for wounds on his legs this is definitely going to require compression. There was so much edema in the left I did not wish to put him in a total contact cast today. I am going to increase his compression from 3-4 layer. The area on the right lateral fifth met head actually look quite good and superficial. 12/23; patient arrived with callus on the right fifth met head and the substantial hyper granulated callused wound on the base of his fifth metatarsal. He says he is completing his Levaquin in 2 days but I do not think that adds up with what I gave him but I will have to double check this. We are using Hydrofera Blue on both areas. My plan is to put the left leg in a cast the week after New Year's 04/06/2020; patient's wounds about the same. Right lateral fifth metatarsal head and left lateral foot over the base of the fifth metatarsal. There is undermining on the  left lateral foot which I removed before application of total contact cast continuing with Hydrofera Blue new. Patient tells me he was seen by endocrinology today lab work was done [Dr. Kerr]. Also wondering whether he was referred to cardiology. I went over some lab work from previously  does not have chronic renal failure certainly not nephrotic range proteinuria he does have very poorly controlled diabetes but this is not his most updated lab work. Hemoglobin A1c has been over 11 1/10; the patient had a considerable amount of leakage towards mid part of his left foot with macerated skin however the wound surface looks better the area on the right lateral fifth met head is better as well. I am going to change the dressing on the left foot under the total contact cast to silver alginate, continue with Hydrofera Blue on the right. 1/20; patient was in the total contact cast for 10 days. Considerable amount of drainage although the skin around the wound does not look too bad on the left foot. The area on the right fifth metatarsal head is closed. Our nursing staff reports large amount of drainage out of the left lateral foot wound 1/25; continues with copious amounts of drainage described by our intake staff. PCR culture I did last week showed E. coli and Enterococcus faecalis and low quantities. Multiple resistance genes documented including extended spectrum beta lactamase, MRSA, MRSE, quinolone, tetracycline. The wound is not quite as good this week as it was 5 days ago but about the same size 2/3; continues with copious amounts of malodorous drainage per our intake nurse. The PCR culture I did 2 weeks ago showed E. coli and low quantities of Enterococcus. There were multiple resistance genes detected. I put Neosporin on him last week although this does not seem to have helped. The wound is slightly deeper today. Offloading continues to be an issue here although with the amount of drainage she has a  total contact cast is just not going to work 2/10; moderate amount of drainage. Patient reports he cannot get his stocking on over the dressing. I told him we have to do that the nurse gave him suggestions on how to make this work. The wound is on the bottom and lateral part of his left foot. Is cultured predominantly grew low amounts of Enterococcus, E. coli and anaerobes. There were multiple resistance genes detected including extended spectrum beta lactamase, quinolone, tetracycline. I could not think of an easy oral combination to address this so for now I am going to do topical antibiotics provided by [facility] I think the main agents here are vancomycin and an aminoglycoside. We have to be able to give him access to the wounds to get the topical antibiotic on 2/17; moderate amount of drainage this is unchanged. He has his Keystone topical antibiotic against the deep tissue culture organisms. He has been using this and changing the dressing daily. Silver alginate on the wound surface. 2/24; using Keystone antibiotic with silver alginate on the top. He had too much drainage for a total contact cast at one point although I think that is improving and I think in the next week or 2 it might be possible to replace a total contact cast I did not do this today. In general the wound surface looks healthy however he continues to have thick rims of skin and subcutaneous tissue around the wide area of the circumference which I debrided 06/04/2020 upon evaluation today patient appears to be doing well in regard to his wound. I do feel like he is showing signs of improvement. There is little bit of callus and dead tissue around the edges of the wound as well as what appears to be a little bit of a sinus tract that is off to the side laterally  I would perform debridement to clear that away today. 3/17; left lateral foot. The wound looks about the same as I remember. Not much depth surface looks healthy. No  evidence of infection 3/25; left lateral foot. Wound surface looks about the same. Separating epithelium from the circumference. There really is no evidence of infection here however not making progress by my view 3/29; left lateral foot. Surface of the wound again looks reasonably healthy still thick skin and subcutaneous tissue around the wound margins. There is no evidence of infection. One of the concerns being brought up by the nurses has again the amount of drainage vis--vis continued use of a total contact cast 4/5; left lateral foot at roughly the base of the fifth metatarsal. Nice healthy looking granulated tissue with rims of epithelialization. The overall wound measurements are not any better but the tissue looks healthy. The only concern is the amount of drainage although he has no surrounding maceration with what we have been doing recently to absorb fluid and protect his skin. He also has lymphedema. 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 He states that the kerlix/coban under the TCC rolled down and could not get it back up. He has been using an offloading scooter and has somehow hurt his right foot using this device. This happened last week. He states that the side of his right foot developed a blister and opened. The top of his foot also has a few small open wounds he thinks is due to his socks rubbing in his shoes. He has not been using any dressings to the wound. He denies purulent drainage, fever/chills or erythema to the wounds. 4/22; patient presents for 1 week follow-up. He developed new wounds to the right foot that were evaluated at last clinic visit. He continues to have a total contact cast to the left leg and he reports no  issues. He has been using silver collagen to the right foot wounds with no issues. He denies purulent drainage, fever/chills or erythema to the right foot wounds. He has no complaints today 4/25; patient presents for 1 week follow-up. He has a total contact cast of the left leg and reports no issues. He has been using silver alginate to the right foot wound. He denies purulent drainage, fever/chills or erythema to the right foot wounds. 5/2 patient presents for 1 week follow-up. T contact cast on the left. The wound which is on the base of the plantar foot at the base of the fifth metatarsal otal actually looks quite good and dimensions continue to gradually contract. HOWEVER the area on the right lateral fifth metatarsal head is much larger than what I remember from 2 weeks ago. Once more is he has significant levels of hypergranulation. Noteworthy that he had this same hyper granulated response on his wound on the left foot at one point in time. So much so that he I thought there was an underlying fluid collection. Based on this I think this just needs debridement. 5/9; the wound on the left actually continues to be gradually smaller with a healthy surface. Slight amount of drainage and maceration of the skin around but not too bad. However he has a large wound over the right fifth metatarsal  head very much in the same configuration as his left foot wound was initially. I used silver nitrate to address the hyper granulated tissue no mechanical debridement 5/16; area on the left foot did not look as healthy this week deeper thick surrounding macerated skin and subcutaneous tissue. The area on the right foot fifth met head was about the same The area on the right ankle that we identified last week is completely broken down into an open wound presumably a stocking rubbing issue 5/23; patient has been using a total contact cast to the left side. He has been using silver alginate underneath. He has  also been using silver alginate to the right foot wounds. He has no complaints today. He denies any signs of infection. 5/31; the left-sided wound looks some better measure smaller surface granulation looks better. We have been using silver alginate under the total contact cast The large area on his right fifth met head and right dorsal foot look about the same still using silver alginate 6/6; neither side is good as I was hoping although the surface area dimensions are better. A lot of maceration on his left and right foot around the wound edge. Area on the dorsal right foot looks better. He says he was traveling. I am not sure what does the amount of maceration around the plantar wounds may be drainage issues 6/13; in general the wound surfaces look quite good on both sides. Macerated skin and raised edges around the wound required debridement although in general especially on the left the surface area seems improved. The area on the right dorsal ankle is about the same I thought this would not be such a problem to close 6/20; not much change in either wound although the one on the right looks a little better. Both wounds have thick macerated edges to the skin requiring debridements. We have been using silver alginate. The area on the dorsal right ankle is still open I thought this would be closed. 6/28; patient comes in today with a marked deterioration in the right foot wound fifth met head. Wide area of exposed bone this is a drastic change from last time. The area on the left there we have been casting is stagnant. We have been using silver alginate in both wound areas. 7/25; bone culture I did for PCR last time was positive for Pseudomonas, group B strep, Enterococcus and Staph aureus. There was no suggestion of methicillin resistance or ampicillin resistant genes. This was resistant to tetracycline however He comes into the clinic today with the area over his right plantar fifth metatarsal  head which had been doing so well 2 weeks ago completely necrotic feeling bone. I do not know that this is going to be salvageable. The left foot wound is certainly no smaller but it has a better surface and is superficial. 7/8; patient called in this morning to say that his total contact cast was rubbing against his foot. He states he is doing fine overall. He denies signs of infection. Electronic Signature(s) Signed: 10/09/2020 4:29:21 PM By: [name] DO Entered By: [name] on 10/09/2020 14:24:20 -------------------------------------------------------------------------------- Physical Exam Details Patient Name: Date of Service: [name], [name] 10/09/2020 1:15 PM Medical Record Number: [id number] Patient Account Number: [id number] Date of Birth/Sex: Treating RN: 11/15/1985 (35 y.o. M) Primary Care Provider: [name] Other Clinician: Referring Provider: Treating Provider/Extender: [name] in Treatment: 50 Notes Left foot wound with granulation tissue. No signs of infection. Electronic  Signature(s) Signed: 10/09/2020 4:29:21 PM By: [name] DO Entered By: [name] on 10/09/2020 14:27:20 -------------------------------------------------------------------------------- Physician Orders Details Patient Name: Date of Service: [name], [name] 10/09/2020 1:15 PM Medical Record Number: [id number] Patient Account Number: [id number] Date of Birth/Sex: Treating RN: Mar 28, 1986 (35 y.o. M) Primary Care Provider: [name] Other Clinician: Referring Provider: Treating Provider/Extender: [name] in Treatment: 56 Verbal / Phone Orders: No Diagnosis Coding ICD-10 Coding Code Description E11.621 Type 2 diabetes mellitus with foot ulcer L97.528 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ity L97.518 Non-pressure chronic ulcer of other part of right foot with  other specified severity 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 Wound Treatment Electronic Signature(s) Signed: 10/09/2020 4:29:21 PM By: [name] DO Entered By: [name] on 10/09/2020 14:27:30 -------------------------------------------------------------------------------- Problem List Details Patient Name: Date of Service: [name], [name] 10/09/2020 1:15 PM Medical Record Number: [id number] Patient Account Number: [id number] Date of Birth/Sex: Treating RN: Jun 04, 1985 (35 y.o. 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 08/10/2020 No Yes Inactive Problems ICD-10 Code Description Active Date Inactive Date L97.518 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ity 10/24/2019 10/24/2019 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, right ankle and foot 10/24/2019 10/24/2019 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 He comes in for review of a wound over the plantar fifth metatarsal head extending into the lateral part of the foot. He was followed for this previously by his podiatrist Dr. Cornelius Moras. As the patient tells his story he went to see podiatry first for a swelling he developed on the lateral part of his fifth metatarsal head in May. He states this was "open" by podiatry and the area closed. He was followed up in June and it was again opened callus removed and it closed promptly. There were plans being made for surgery on the fifth metatarsal head in June however his blood sugar was apparently too high for anesthesia. Apparently the area was debrided and opened again in June and it is never  closed since. Looking over the records from podiatry I am really not able to follow this. It was clear when he was first seen it was before 5/14 at that point he already had a wound. By 5/17 the ulcer was resolved. I do not see anything about a procedure. On 5/28 noted to have pre-ulcerative moderate keratosis. X-ray noted 1/5 contracted toe and tailor's bunion and metatarsal deformity. On a visit date on 09/28/2018 the dorsal part  of the left foot it healed and resolved. There was concern about swelling in his lower extremity he was sent to the ER.. As far as I can tell he was seen in the ER on 7/12 with an ulcer on his left foot. A DVT rule out of the left leg was negative. I do not think I have complete records from podiatry but I am not able to verify the procedures this patient states he had. He states after the last procedure the wound has never closed although I am not able to follow this in the records I have from podiatry. He has not had a recent x-ray The patient has been using Neosporin on the wound. He is wearing a Darco shoe. He is still very active up on his foot working and exercising. Past medical history; type 2 diabetes ketosis-prone, leg swelling with a negative DVT study in July. Non-smoker ABI in our clinic was 0.85 on the left 10/16; substantial wound on the plantar left fifth met head extending laterally almost to the dorsal fifth MTP. We have been using silver alginate we gave him a Darco forefoot off loader. An x-ray did not show evidence of osteomyelitis did note soft tissue emphysema which I think was due to gas tracking through an open wound. There is no doubt in my mind he requires an MRI 10/23; MRI not booked until 3 November at the earliest this is largely due to his glucose sensor in the right arm. We have been using silver alginate. There has been an improvement 10/29; I am still not exactly sure when his MRI is booked for. He says it is the third but it is the 10th in epic. This definitely needs to be done. He is running a low-grade fever today but no other symptoms. No real improvement in the 1 02/26/2019 patient presents today for a follow-up visit here in our clinic he is last been seen in the clinic on October 29. Subsequently we were working on getting MRI to evaluate and see what exactly was going on and where we would need to go from the standpoint of whether or not he had osteomyelitis  and again what treatments were going be required. Subsequently the patient ended up being admitted to the hospital on 02/07/2019 and was discharged on 02/14/2019. This is a somewhat interesting admission with a discharge diagnosis of pneumonia due to COVID-19 although he was positive for COVID-19 when tested at the urgent care but negative x2 when he was actually in the hospital. With that being said he did have acute respiratory failure with hypoxia and it was noted he also have a left foot ulceration with osteomyelitis. With that being said he did require oxygen for his pneumonia and I level 4 L. He was placed on antivirals and steroids for the COVID-19. He was also transferred to the [facility] at one point. Nonetheless he did subsequently discharged home and since being home has done much better in that regard. The CT angiogram did not show any pulmonary  embolism. With regard to the osteomyelitis the patient was placed on vancomycin and Zosyn while in the hospital but has been changed to Augmentin at discharge. It was also recommended that he follow- up with wound care and podiatry. Podiatry however wanted him to see [name] according to the patient prior to them doing anything further. His hemoglobin A1c was 9.9 as noted in the hospital. Have an MRI of the left foot performed while in the hospital on 02/04/2019. 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 There is an overlying plantar open wound noted an abscess tracking back along the lateral aspect of the fifth metatarsal shaft. There is otherwise diffuse cellulitis and mild fasciitis without findings of polymyositis. The patient did have recently pneumonia secondary to COVID-19 I looked in the chart through epic and it does appear that the patient may need to have an additional x-ray just to ensure everything is cleared and that he has no airspace  disease prior to putting him into the [name]. 03/05/2019; patient was readmitted to the clinic last week. He was hospitalized twice for a viral upper respiratory tract infection from 11/1 through 11/4 and then 11/5 through 11/12 ultimately this turned out to be Covid pneumonitis. Although he was discharged on oxygen he is not using it. He says he feels fine. He has no exercise limitation no cough no sputum. His O2 sat in our clinic today was 100% on room air. 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 He received Vanco and Zosyn in the hospital and then was discharged on 2 weeks of Augmentin. I do not see any relevant cultures. He was supposed to follow-up with infectious disease but I do not see that he has an appointment. 12/8; patient saw Dr. Novella Olive of infectious disease last week. He felt that he had had adequate antibiotic therapy. He did not go to follow-up with Dr. Amalia Hailey of podiatry and I have again talked to him about the pros and cons of this. He does not want to consider a ray amputation of this time. He is aware of the risks of recurrence, migration etc. He started HBO today and tolerated this well. He can complete the Augmentin that I gave him last week. I have looked over the lab work that Dr. Chana Bode ordered his C-reactive protein was 3.3 and his sedimentation rate was 17. The C-reactive protein is never really been measurably that high in this patient 12/15; not much change in the wound today however he has undermining along the lateral part of the foot again more extensively than last week. He has some rims of epithelialization. We have been using silver alginate. He is undergoing hyperbarics but did not dive today 12/18; in for his obligatory first total contact cast change. Unfortunately there was pus coming from the undermining area around his fifth metatarsal  head. This was cultured but will preclude reapplication of a cast. He is seen in conjunction with HBO 12/24; patient had staph lugdunensis in the wound in the undermining area laterally last time. We put him on doxycycline which should have covered this. The wound looks better today. I am going to give him another week of doxycycline before reattempting the total contact cast 12/31; the patient is completing antibiotics. Hemorrhagic debris in the distal part of the wound with some undermining distally.  He also had hyper granulation. Extensive debridement with a #5 curette. The infected area that was on the lateral part of the fifth met head is closed over. I do not think he needs any more antibiotics. Patient was seen prior to HBO. Preparations for a total contact cast were made in the cast will be placed post hyperbarics 04/11/19; once again the patient arrives today without complaint. He had been in a cast all week noted that he had heavy drainage this week. This resulted in large raised areas of macerated tissue around the wound 1/14; wound bed looks better slightly smaller. Hydrofera Blue has been changing himself. He had a heavy drainage last week which caused a lot of maceration around the wound so I took him out of a total contact cast he says the drainage is actually better this week He is seen today in conjunction with HBO 1/21; returns to clinic. He was up in [place] for a day or 2 attending a funeral. He comes back in with the wound larger and with a large area of exposed bone. He had osteomyelitis and septic arthritis of the fifth left metatarsal head while he was in hospital. He received IV antibiotics in the hospital for a prolonged period of time then 3 weeks of Augmentin. Subsequently I gave him 2 weeks of doxycycline for more superficial wound infection. When I saw this last week the wound was smaller the surface of the wound looks satisfactory. 1/28; patient missed hyperbarics today.  Bone biopsy I did last time showed Enterococcus faecalis and Staphylococcus lugdunensis . He has a wide area of exposed bone. We are going to use silver alginate as of today. I had another ethical discussion with the patient. 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 In this situation I think the likelihood of healing this is low. Therefore I have recommended a ray amputation and with the patient's agreement I have referred him to Dr. Doran Durand. The other issue is that his compliance with hyperbarics has been minimal because of his work schedule and given his underlying decision I am going to stop this today READMISSION 10/24/2019 MRI 09/29/2019 left foot IMPRESSION: 1. Apparent skin ulceration inferior and lateral to the 5th metatarsal base with underlying heterogeneous T2 signal and enhancement in the subcutaneous fat. Small peripherally enhancing fluid collections along the plantar and lateral aspects of the 5th metatarsal base suspicious for abscesses. 2. Interval amputation through the mid 5th metatarsal with nonspecific low-level marrow edema and enhancement. Given the proximity to the adjacent soft tissue inflammatory changes, osteomyelitis cannot be excluded. 3. The additional bones appear unremarkable. MRI 09/29/2019 right foot IMPRESSION: 1. Soft tissue ulceration lateral to the 5th MTP joint. There is low-level T2 hyperintensity within the 4th and 5th metatarsal heads and adjacent proximal phalanges without abnormal T1 signal or cortical destruction. These findings are nonspecific and could be seen with early marrow edema, hyperemia or early osteomyelitis. No evidence of septic joint. 2. Mild tenosynovitis and synovial enhancement associated with the extensor digitorum tendons at the level of the midfoot. 3. Diffuse low-level muscular T2 hyperintensity and enhancement, most consistent with diabetic myopathy. LEFT FOOT BONE Methicillin resistant  staphylococcus aureus Staphylococcus lugdunensis MIC MIC CIPROFLOXACIN >=8 RESISTANT Resistant <=0.5 SENSI... Sensitive CLINDAMYCIN <=0.25 SENS... Sensitive >=8 RESISTANT Resistant ERYTHROMYCIN >=8 RESISTANT Resistant >=8 RESISTANT Resistant GENTAMICIN <=0.5 SENSI... Sensitive <=0.5 SENSI... Sensitive Inducible Clindamycin NEGATIVE Sensitive NEGATIVE Sensitive OXACILLIN >=4 RESISTANT Resistant 2 SENSITIVE Sensitive RIFAMPIN <=0.5 SENSI... Sensitive <=0.5 SENSI... Sensitive  TETRACYCLINE <=1 SENSITIVE Sensitive <=1 SENSITIVE Sensitive TRIMETH/SULFA <=10 SENSIT Sensitive <=10 SENSIT Sensitive ... [name].. VANCOMYCIN 1 SENSITIVE Sensitive <=0.5 SENSI... Sensitive Right foot bone . Component 3 wk ago Specimen Description BONE Special Requests RIGHT 4 METATARSAL SAMPLE B Gram Stain NO WBC SEEN NO ORGANISMS SEEN Culture RARE METHICILLIN RESISTANT STAPHYLOCOCCUS AUREUS NO ANAEROBES ISOLATED Performed at [facility], [place] [address] [place], [place] [address] Report Status 10/08/2019 FINAL Organism ID, Bacteria METHICILLIN RESISTANT STAPHYLOCOCCUS AUREUS Resulting Agency [facility] Susceptibility Methicillin resistant staphylococcus aureus MIC CIPROFLOXACIN >=8 RESISTANT Resistant CLINDAMYCIN <=0.25 SENS... Sensitive ERYTHROMYCIN >=8 RESISTANT Resistant GENTAMICIN <=0.5 SENSI... Sensitive Inducible Clindamycin NEGATIVE Sensitive OXACILLIN >=4 RESISTANT Resistant RIFAMPIN <=0.5 SENSI... Sensitive TETRACYCLINE <=1 SENSITIVE Sensitive TRIMETH/SULFA <=10 SENSIT Sensitive ... VANCOMYCIN 1 SENSITIVE Sensitive This is a patient we had in clinic earlier this year with a wound over his left fifth metatarsal head. 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 Doran Durand on 05/16/2019. He seems to have developed problems with wounds on his bilateral feet in June 2021 although he may have had problems earlier than this. He was in an urgent care with a right foot ulcer on 09/26/2019 and given a course of doxycycline. This was apparently after having trouble getting into see orthopedics. He was seen by podiatry on 09/28/2019 noted to have bilateral lower extremity ulcers including the left lateral fifth metatarsal base and the right subfifth met head. It was noted that had purulent drainage at that time. He required hospitalization from 6/20 through 7/2. This was because of worsening right foot wounds. He underwent bilateral operative incision and drainage and bone biopsies bilaterally. Culture results are listed above. He has been referred back to clinic by Dr. Jacqualyn Posey of podiatry. He is also followed by Dr. Megan Salon who saw him yesterday. He was discharged from hospital on Zyvox Flagyl and Levaquin and yesterday changed to doxycycline Flagyl and Levaquin. His inflammatory markers on 6/26 showed a sedimentation rate of 129 and a C-reactive protein of 5. This is improved to 14 and 1.3 respectively. This would indicate improvement. ABIs in our clinic today were 1.23 on the right and 1.20 on the left 11/01/2019 on evaluation today patient appears to be doing fairly well in regard to the wounds on his feet at this point. Fortunately there is no signs of active infection at this time. No fevers, chills, nausea, vomiting, or diarrhea. He currently is seeing infectious disease and still under their care at this point. Subsequently he also has both wounds which she has not been using collagen on as he did not receive that in his packaging he did not call us and let us know that. Apparently that just was missed on the order. Nonetheless we will get that straightened out today. 8/9-Patient returns for bilateral foot wounds, using  Prisma with hydrogel moistened dressings, and the wounds appear stable. Patient using surgical shoes, avoiding much pressure or weightbearing as much as possible 8/16; patient has bilateral foot wounds. 1 on the right lateral foot proximally the other is on the left mid lateral foot. Both required debridement of callus and thick skin around the wounds. We have been using silver collagen 8/27; patient has bilateral lateral foot wounds. The area on the left  substantially surrounded by callus and dry skin. This was removed from the wound edge. The underlying wound is small. The area on the right measured somewhat smaller today. 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 Unfortunately I did not update his antibiotics during today's visit. 9/10 I reviewed Dr. Hale Bogus last notes he felt he had completed antibiotics his inflammatory markers were reasonably well controlled. He has a small wound on the lateral left foot and a tiny area on the right which is just above closed. He is using Hydrofera Blue with border foam he has bilateral surgical shoes 9/24; 2 week f/u. doing well. right foot is closed. left foot still undermined. 10/14; right foot remains closed at the fifth met head. The area over the base of the left fifth metatarsal has a small open area but considerable undermining towards the plantar foot. Thick callus skin around this suggests an adequate pressure relief. We have talked about this. He says he is going to go back into his cam boot. I suggested a total contact cast he did not seem enamored with this suggestion 10/26; left foot base of the fifth metatarsal. Same condition as last time. He has skin over the area with an open wound however the skin is not adherent. He went to see Dr. Earleen Newport who did an x-ray and culture of his foot I have not reviewed the x-ray but the patient was not told anything. He is on  doxycycline 11/11; since the patient was last here he was in the emergency room on 10/30 he was concerned about swelling in the left foot. They did not do any cultures or x-rays. They changed his antibiotics to cephalexin. Previous culture showed group B strep. The cephalexin is appropriate as doxycycline has less than predictable coverage. Arrives in clinic today with swelling over this area under the wound. He also has a new wound on the right fifth metatarsal head 11/18; the patient has a difficult wound on the lateral aspect of the left fifth metatarsal head. The wound was almost ballotable last week I opened it slightly expecting to see purulence however there was just bleeding. I cultured this this was negative. X-ray unchanged. We are trying to get an MRI but I am not sure were going to be able to get this through his insurance. He also has an area on the right lateral fifth metatarsal head this looks healthier 12/3; the patient finally got our MRI. Surprisingly this did not show osteomyelitis. I did show the soft tissue ulceration at the lateral plantar aspect of the fifth metatarsal base with a tiny residual 6 mm abscess overlying the superficial fascia I have tried to culture this area I have not been able to get this to grow anything. Nevertheless the protruding tissue looks aggravated. I suspect we should try to treat the underlying "abscess with broad-spectrum antibiotics. I am going to start him on Levaquin and Flagyl. He has much less edema in his legs and I am going to continue to wrap his legs and see him weekly 12/10. I started Levaquin and Flagyl on him last week. He just picked up the Flagyl apparently there was some delay. The worry is the wound on the left fifth metatarsal base which is substantial and worsening. His foot looks like he inverts at the ankle making this a weightbearing surface. Certainly no improvement in fact I think the measurements of this are somewhat worse. We  have been  using 12/17; he apparently just got the Levaquin yesterday this is 2 weeks after the fact. He has completed the Flagyl. The area over the left fifth metatarsal base still has protruding granulation tissue although it does not look quite as bad as it did some weeks ago. He has severe bilateral lymphedema although we have not been treating him for wounds on his legs this is definitely going to require compression. There was so much edema in the left I did not wish to put him in a total contact cast today. I am going to increase his compression from 3-4 layer. The area on the right lateral fifth met head actually look quite good and superficial. 12/23; patient arrived with callus on the right fifth met head and the substantial hyper granulated callused wound on the base of his fifth metatarsal. He says he is completing his Levaquin in 2 days but I do not think that adds up with what I gave him but I will have to double check this. We are using Hydrofera Blue on both areas. My plan is to put the left leg in a cast the week after New Year's 04/06/2020; patient's wounds about the same. Right lateral fifth metatarsal head and left lateral foot over the base of the fifth metatarsal. There is undermining on the left lateral foot which I removed before application of total contact cast continuing with Hydrofera Blue new. Patient tells me he was seen by endocrinology today lab work was done [Dr. Kerr]. Also wondering whether he was referred to cardiology. I went over some lab work from previously does not have chronic renal failure certainly not nephrotic range proteinuria he does have very poorly controlled diabetes but this is not his most updated lab work. Hemoglobin A1c has been over 11 1/10; the patient had a considerable amount of leakage towards mid part of his left foot with macerated skin however the wound surface looks better the area on the right lateral fifth met head is better as well. I am  going to change the dressing on the left foot under the total contact cast to silver alginate, continue with Hydrofera Blue on the right. 1/20; patient was in the total contact cast for 10 days. Considerable amount of drainage although the skin around the wound does not look too bad on the left foot. The area on the right fifth metatarsal head is closed. Our nursing staff reports large amount of drainage out of the left lateral foot wound 1/25; continues with copious amounts of drainage described by our intake staff. PCR culture I did last week showed E. coli and Enterococcus faecalis and low quantities. Multiple resistance genes documented including extended spectrum beta lactamase, MRSA, MRSE, quinolone, tetracycline. The wound is not quite as good this week as it was 5 days ago but about the same size 2/3; continues with copious amounts of malodorous drainage per our intake nurse. The PCR culture I did 2 weeks ago showed E. coli and low quantities of Enterococcus. There were multiple resistance genes detected. I put Neosporin on him last week although this does not seem to have helped. The wound is slightly deeper today. Offloading continues to be an issue here although with the amount of drainage she has a total contact cast is just not going to work 2/10; moderate amount of drainage. Patient reports he cannot get his stocking on over the dressing. I told him we have to do that the nurse gave him suggestions on how to make this  work. The wound is on the bottom and lateral part of his left foot. Is cultured predominantly grew low amounts of Enterococcus, E. coli and anaerobes. There were multiple resistance genes detected including extended spectrum beta lactamase, quinolone, tetracycline. I could not think of an easy oral combination to address this so for now I am going to do topical antibiotics provided by [facility] I think the main agents here are vancomycin and an aminoglycoside. We have to be  able to give him access to the wounds to get the topical antibiotic on 2/17; moderate amount of drainage this is unchanged. He has his Keystone topical antibiotic against the deep tissue culture organisms. He has been using this and changing the dressing daily. Silver alginate on the wound surface. 2/24; using Keystone antibiotic with silver alginate on the top. He had too much drainage for a total contact cast at one point although I think that is improving and I think in the next week or 2 it might be possible to replace a total contact cast I did not do this today. In general the wound surface looks healthy however he continues to have thick rims of skin and subcutaneous tissue around the wide area of the circumference which I debrided 06/04/2020 upon evaluation today patient appears to be doing well in regard to his wound. I do feel like he is showing signs of improvement. There is little bit of callus and dead tissue around the edges of the wound as well as what appears to be a little bit of a sinus tract that is off to the side laterally I would perform debridement to clear that away today. 3/17; left lateral foot. The wound looks about the same as I remember. Not much depth surface looks healthy. No evidence of infection 3/25; left lateral foot. Wound surface looks about the same. Separating epithelium from the circumference. There really is no evidence of infection here however not making progress by my view 3/29; left lateral foot. Surface of the wound again looks reasonably healthy still thick skin and subcutaneous tissue around the wound margins. There is no evidence of infection. One of the concerns being brought up by the nurses has again the amount of drainage vis--vis continued use of a total contact cast 4/5; left lateral foot at roughly the base of the fifth metatarsal. Nice healthy looking granulated tissue with rims of epithelialization. The overall wound measurements are not any  better but the tissue looks healthy. The only concern is the amount of drainage although he has no surrounding maceration with what we have been doing recently to absorb fluid and protect his skin. He also has lymphedema. 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 He states that the kerlix/coban under the TCC rolled down and could not get it back up. He has been using an offloading scooter and has somehow hurt his right foot using this device. This happened last week. He states that the side of his right foot developed a blister and opened. The top of his foot also has a few small open wounds he thinks is due to his socks rubbing in his shoes. He has not been using any dressings to the wound. He denies purulent drainage, fever/chills or erythema  to the wounds. 4/22; patient presents for 1 week follow-up. He developed new wounds to the right foot that were evaluated at last clinic visit. He continues to have a total contact cast to the left leg and he reports no issues. He has been using silver collagen to the right foot wounds with no issues. He denies purulent drainage, fever/chills or erythema to the right foot wounds. He has no complaints today 4/25; patient presents for 1 week follow-up. He has a total contact cast of the left leg and reports no issues. He has been using silver alginate to the right foot wound. He denies purulent drainage, fever/chills or erythema to the right foot wounds. 5/2 patient presents for 1 week follow-up. T contact cast on the left. The wound which is on the base of the plantar foot at the base of the fifth metatarsal otal actually looks quite good and dimensions continue to gradually contract. HOWEVER the area on the right  lateral fifth metatarsal head is much larger than what I remember from 2 weeks ago. Once more is he has significant levels of hypergranulation. Noteworthy that he had this same hyper granulated response on his wound on the left foot at one point in time. So much so that he I thought there was an underlying fluid collection. Based on this I think this just needs debridement. 5/9; the wound on the left actually continues to be gradually smaller with a healthy surface. Slight amount of drainage and maceration of the skin around but not too bad. However he has a large wound over the right fifth metatarsal head very much in the same configuration as his left foot wound was initially. I used silver nitrate to address the hyper granulated tissue no mechanical debridement 5/16; area on the left foot did not look as healthy this week deeper thick surrounding macerated skin and subcutaneous tissue. oo The area on the right foot fifth met head was about the same oo The area on the right ankle that we identified last week is completely broken down into an open wound presumably a stocking rubbing issue 5/23; patient has been using a total contact cast to the left side. He has been using silver alginate underneath. He has also been using silver alginate to the right foot wounds. He has no complaints today. He denies any signs of infection. 5/31; the left-sided wound looks some better measure smaller surface granulation looks better. We have been using silver alginate under the total contact cast oo The large area on his right fifth met head and right dorsal foot look about the same still using silver alginate 6/6; neither side is good as I was hoping although the surface area dimensions are better. A lot of maceration on his left and right foot around the wound edge. Area on the dorsal right foot looks better. He says he was traveling. I am not sure what does the amount of maceration around the plantar wounds  may be drainage issues 6/13; in general the wound surfaces look quite good on both sides. Macerated skin and raised edges around the wound required debridement although in general especially on the left the surface area seems improved. oo The area on the right dorsal ankle is about the same I thought this would not be such a problem to close 6/20; not much change in either wound although the one on the right looks a little better. Both wounds have thick macerated edges to the skin requiring debridements. We have been  using silver alginate. The area on the dorsal right ankle is still open I thought this would be closed. 6/28; patient comes in today with a marked deterioration in the right foot wound fifth met head. Wide area of exposed bone this is a drastic change from last time. The area on the left there we have been casting is stagnant. We have been using silver alginate in both wound areas. 7/25; bone culture I did for PCR last time was positive for Pseudomonas, group B strep, Enterococcus and Staph aureus. There was no suggestion of methicillin resistance or ampicillin resistant genes. This was resistant to tetracycline however He comes into the clinic today with the area over his right plantar fifth metatarsal head which had been doing so well 2 weeks ago completely necrotic feeling bone. I do not know that this is going to be salvageable. The left foot wound is certainly no smaller but it has a better surface and is superficial. 7/8; patient called in this morning to say that his total contact cast was rubbing against his foot. He states he is doing fine overall. He denies signs of infection. Patient History Information obtained from Patient. Family History No family history of Cancer, Diabetes, Hereditary Spherocytosis, Hypertension, Kidney Disease, Lung Disease, Seizures, Stroke, Thyroid Problems, Tuberculosis. Social History Never smoker, Marital Status - Single, Alcohol Use - Rarely,  Drug Use - No History, Caffeine Use - Never. Medical History Eyes Denies history of Cataracts, Glaucoma, Optic Neuritis Ear/Nose/Mouth/Throat Denies history of Chronic sinus problems/congestion, Middle ear problems Hematologic/Lymphatic Denies history of Anemia, Hemophilia, Human Immunodeficiency Virus, Lymphedema, Sickle Cell Disease Respiratory Denies history of Aspiration, Asthma, Chronic Obstructive Pulmonary Disease (COPD), Pneumothorax, Sleep Apnea, Tuberculosis Cardiovascular Denies history of Angina, Arrhythmia, Congestive Heart Failure, Coronary Artery Disease, Deep Vein Thrombosis, Hypertension, Hypotension, Myocardial Infarction, Peripheral Arterial Disease, Peripheral Venous Disease, Phlebitis, Vasculitis Gastrointestinal Denies history of Cirrhosis , Colitis, Crohnoos, Hepatitis A, Hepatitis B, Hepatitis C Endocrine Patient has history of Type II Diabetes Denies history of Type I Diabetes Immunological Denies history of Lupus Erythematosus, Raynaudoos, Scleroderma Integumentary (Skin) Denies history of History of Burn Musculoskeletal Denies history of Gout, Rheumatoid Arthritis, Osteoarthritis, Osteomyelitis Neurologic Denies history of Dementia, Neuropathy, Quadriplegia, Paraplegia, Seizure Disorder Oncologic Denies history of Received Chemotherapy, Received Radiation Psychiatric Denies history of Anorexia/bulimia, Confinement Anxiety Hospitalization/Surgery History - 11/1-11/06/2018- sepsis foot infection. - 11/4-11/5 02 sats low respiratory distress. Objective Constitutional Vitals Time Taken: 1:45 PM, Height: 77 in, Weight: 280 lbs, BMI: 33.2, Temperature: 98.8 F, Pulse: 98 bpm, Respiratory Rate: 18 breaths/min, Blood Pressure: 130/82 mmHg. Integumentary (Hair, Skin) Wound #3 status is Open. Original cause of wound was Trauma. The date acquired was: 10/02/2019. The wound has been in treatment 50 weeks. The wound is located on the Left,Lateral Foot. The wound  measures 2.5cm length x 2.5cm width x 0.2cm depth; 4.909cm^2 area and 0.982cm^3 volume. There is Fat Layer (Subcutaneous Tissue) exposed. There is no tunneling or undermining noted. There is a large amount of serosanguineous drainage noted. The wound margin is thickened. There is large (67-100%) pink granulation within the wound bed. There is a small (1-33%) amount of necrotic tissue within the wound bed including Adherent Slough. Wound #5 status is Open. Original cause of wound was Gradually Appeared. The date acquired was: 07/07/2020. The wound has been in treatment 12 weeks. The wound is located on the Right,Lateral Metatarsal head fifth. The wound measures 2cm length x 2.5cm width x 0.3cm depth; 3.927cm^2 area and 1.178cm^3 volume. There is bone and  Fat Layer (Subcutaneous Tissue) exposed. There is no tunneling or undermining noted. There is a medium amount of serosanguineous drainage noted. The wound margin is distinct with the outline attached to the wound base. There is medium (34-66%) pink granulation within the wound bed. There is a medium (34-66%) amount of necrotic tissue within the wound bed including Adherent Slough. Wound #7 status is Open. Original cause of wound was Gradually Appeared. The date acquired was: 08/17/2020. The wound has been in treatment 7 weeks. The wound is located on the Right,Anterior Ankle. The wound measures 0.8cm length x 0.3cm width x 0.1cm depth; 0.188cm^2 area and 0.019cm^3 volume. There is Fat Layer (Subcutaneous Tissue) exposed. There is no tunneling or undermining noted. There is a medium amount of serosanguineous drainage noted. The wound margin is distinct with the outline attached to the wound base. There is large (67-100%) red granulation within the wound bed. There is a small (1-33%) amount of necrotic tissue within the wound bed including Adherent Slough. 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 There are no areas of skin breakdown where the patient reports the cast is rubbing. We will add extra padding to this area. The cast was replaced today. Procedures Wound #3 Pre-procedure diagnosis of Wound #3 is a Diabetic Wound/Ulcer of the Lower Extremity located on the Left,Lateral Foot . There was a T Programmer, multimedia otal Procedure by [name], DO. Post procedure Diagnosis Wound #3: Same as Pre-Procedure Plan 1. T contact cast With Hydrofera Blue otal Electronic Signature(s) Signed: 10/09/2020 4:29:21 PM By: [name] DO Entered By: [name] on 10/09/2020 14:29:01 -------------------------------------------------------------------------------- HxROS Details Patient Name: Date of Service: [name], [name] 10/09/2020 1:15 PM Medical Record Number: [id number] Patient Account Number: [id number] Date of Birth/Sex: Treating RN: 1986-01-21 (35 y.o. M) Primary Care Provider: [name] Other Clinician: Referring Provider: Treating Provider/Extender: [name] in Treatment: 55 Information Obtained From Patient Eyes Medical History: Negative for: Cataracts; Glaucoma; Optic Neuritis Ear/Nose/Mouth/Throat Medical History: Negative for: Chronic sinus problems/congestion; Middle ear problems Hematologic/Lymphatic Medical History: Negative for: Anemia; Hemophilia; Human Immunodeficiency Virus; Lymphedema; Sickle Cell Disease Respiratory Medical History: Negative for: Aspiration; Asthma; Chronic Obstructive Pulmonary Disease (COPD); Pneumothorax; Sleep Apnea; Tuberculosis Cardiovascular Medical History: Negative for: Angina; Arrhythmia; Congestive Heart Failure; Coronary Artery Disease; Deep  Vein Thrombosis; Hypertension; Hypotension; Myocardial Infarction; Peripheral Arterial Disease; Peripheral Venous Disease; Phlebitis; Vasculitis Gastrointestinal Medical History: Negative for: Cirrhosis ; Colitis; Crohns; Hepatitis A; Hepatitis B; Hepatitis C Endocrine Medical History: Positive for: Type II Diabetes Negative for: Type I Diabetes Time with diabetes: 8 Treated with: Insulin Blood sugar tested every day: No Immunological Medical History: Negative for: Lupus Erythematosus; Raynauds; Scleroderma Integumentary (Skin) Medical History: Negative for: History of Burn Musculoskeletal Medical History: Negative for: Gout; Rheumatoid Arthritis; Osteoarthritis; Osteomyelitis Neurologic Medical History: Negative for: Dementia; Neuropathy; Quadriplegia; Paraplegia; Seizure Disorder Oncologic Medical History: Negative for: Received Chemotherapy; Received Radiation Psychiatric Medical History: Negative for: Anorexia/bulimia; Confinement Anxiety Immunizations Pneumococcal Vaccine: Received Pneumococcal Vaccination: No Implantable Devices None Hospitalization / Surgery History Type of Hospitalization/Surgery 11/1-11/06/2018- sepsis foot infection 11/4-11/5 02 sats low respiratory distress Family and Social History Cancer: No; Diabetes: No; Hereditary Spherocytosis: No; Hypertension: No; Kidney Disease: No; Lung Disease: No; Seizures: No; Stroke:  No; Thyroid Problems: No; Tuberculosis: No; Never smoker; Marital Status - Single; Alcohol Use: Rarely; Drug Use: No History; Caffeine Use: Never; Financial Concerns: No; Food, Clothing or Shelter Needs: No; Support System Lacking: No; Transportation Concerns: No Electronic Signature(s) Signed: 10/09/2020 4:29:21 PM By: [name] DO Entered By: [name] on 10/09/2020 14:24:48 -------------------------------------------------------------------------------- Total Contact Cast Details Patient Name: Date of Service: [name] D 10/09/2020 1:15 PM Medical Record Number: [id number] Patient Account Number: [id number] Date of Birth/Sex: Treating RN: 23-Sep-1985 (35 y.o. [name] Primary Care Provider: [name] Other Clinician: Referring Provider: Treating Provider/Extender: [name] in Treatment: 45 T Contact Cast Applied for Wound Assessment: otal Wound #3 Left,Lateral Foot Performed By: Physician [name], DO Post Procedure Diagnosis Same as Pre-procedure Electronic Signature(s) Signed: 10/09/2020 4:29:21 PM By: [name] DO Signed: 10/13/2020 5:43:47 PM By: [name] RN, BSN Entered By: [name] on 10/09/2020 14:24:22 -------------------------------------------------------------------------------- SuperBill Details Patient Name: Date of Service: [name], [name] 10/09/2020 Medical Record Number: [id number] Patient Account Number: [id number] Date of Birth/Sex: Treating RN: 01/23/1986 (35 y.o. 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 Facility Procedures CPT4 Code: [id number] Description: 29445 - APPLY TOTAL CONTACT LEG CAST ICD-10 Diagnosis Description 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

## 2020-10-14 NOTE — Progress Notes (Signed)
[name], Max ([id number]) Visit Report for 10/13/2020 HPI Details Patient Name: Date of Service: [name] 10/13/2020 3:00 PM Medical Record Number: [id number] Patient Account Number: [id number] Date of Birth/Sex: Treating RN: 1985/12/01 (35 y.o. 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 He comes in for review of a wound over the plantar fifth metatarsal head extending into the lateral part of the foot. He was followed for this previously by his podiatrist Dr. Cornelius [name]. As the patient tells his story he went to see podiatry first for a swelling he developed on the lateral part of his fifth metatarsal head in May. He states this was "open" by podiatry and the area closed. He was followed up in June and it was again opened callus removed and it closed promptly. There were plans being made for surgery on the fifth metatarsal head in June however his blood sugar was apparently too high for anesthesia. Apparently the area was debrided and opened again in June and it is never closed since. Looking over the records from podiatry I am really not able to follow this. It was clear when he was first seen it was before 5/14 at that point he already had a wound. By 5/17 the ulcer was resolved. I do not see anything about a procedure. On 5/28 noted to have pre-ulcerative moderate keratosis. X-ray noted 1/5 contracted toe and tailor's bunion and metatarsal deformity. On a visit date on 09/28/2018 the dorsal part of the left foot it healed and resolved. There was concern about swelling in his lower extremity he was sent to the ER.. As far as I can tell he was seen in the ER on 7/12 with an ulcer on his left foot. A DVT rule  out of the left leg was negative. I do not think I have complete records from podiatry but I am not able to verify the procedures this patient states he had. He states after the last procedure the wound has never closed although I am not able to follow this in the records I have from podiatry. He has not had a recent x-ray The patient has been using Neosporin on the wound. He is wearing a [name] shoe. He is still very active up on his foot working and exercising. Past medical history; type 2 diabetes ketosis-prone, leg swelling with a negative DVT study in July. Non-smoker ABI in our clinic was 0.85 on the left 10/16; substantial wound on the plantar left fifth met head extending laterally almost to the dorsal fifth MTP. We have been using silver alginate we gave him a [name] forefoot off loader. An x-ray did not show evidence of osteomyelitis did note soft tissue emphysema which I think was due to gas tracking through an open wound. There is no doubt in my mind he requires an MRI 10/23; MRI not booked until 3 November at the earliest this is largely due to his glucose sensor in the right arm. We have been using silver alginate. There has been an improvement 10/29; I am still not exactly sure when his MRI is booked for. He says it is the third but it is the 10th in epic. This definitely needs to be done. He is running a low-grade fever today but no other symptoms. No real improvement  in the 1 02/26/2019 patient presents today for a follow-up visit here in our clinic he is last been seen in the clinic on October 29. Subsequently we were working on getting MRI to evaluate and see what exactly was going on and where we would need to go from the standpoint of whether or not he had osteomyelitis and again what treatments were going be required. Subsequently the patient ended up being admitted to the hospital on 02/07/2019 and was discharged on 02/14/2019. This is a somewhat interesting admission with a  discharge diagnosis of pneumonia due to COVID-19 although he was positive for COVID-19 when tested at the urgent care but negative x2 when he was actually in the hospital. With that being said he did have acute respiratory failure with hypoxia and it was noted he also have a left foot ulceration with osteomyelitis. With that being said he did require oxygen for his pneumonia and I level 4 L. He was placed on antivirals and steroids for the COVID-19. He was also transferred to the [facility] at one point. Nonetheless he did subsequently discharged home and since being home has done much better in that regard. The CT angiogram did not show any pulmonary embolism. With regard to the osteomyelitis the patient was placed on vancomycin and Zosyn while in the hospital but has been changed to Augmentin at discharge. It was also recommended that he follow- up with wound care and podiatry. Podiatry however wanted him to see [name] according to the patient prior to them doing anything further. His hemoglobin A1c was 9.9 as noted in the hospital. Have an MRI of the left foot performed while in the hospital on 02/04/2019. 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 There is an overlying plantar open wound noted an abscess tracking back along the lateral aspect of the fifth metatarsal shaft. There is otherwise diffuse cellulitis and mild fasciitis without findings of polymyositis. The patient did have recently pneumonia secondary to COVID-19 I looked in the chart through epic and it does appear that the patient may need to have an additional x-ray just to ensure everything is cleared and that he has no airspace disease prior to putting him into the chamber. 03/05/2019; patient was readmitted to the clinic last week. He was hospitalized twice for a viral upper respiratory tract infection from 11/1 through 11/4 and  then 11/5 through 11/12 ultimately this turned out to be Covid pneumonitis. Although he was discharged on oxygen he is not using it. He says he feels fine. He has no exercise limitation no cough no sputum. His O2 sat in our clinic today was 100% on room air. 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 He received Vanco and Zosyn in the hospital and then was discharged on 2 weeks of Augmentin. I do not see any relevant cultures. He was supposed to follow-up with infectious disease but I do not see that he has an appointment. 12/8; patient saw Dr. Novella Olive of infectious disease last week. He felt that he had had adequate antibiotic therapy. He did not go to follow-up with Dr. Amalia Hailey of podiatry and I have again talked to him about the pros and cons of this. He does not want to consider a ray amputation of this time. He is aware of the risks of  recurrence, migration etc. He started HBO today and tolerated this well. He can complete the Augmentin that I gave him last week. I have looked over the lab work that Dr. Chana Bode ordered his C-reactive protein was 3.3 and his sedimentation rate was 17. The C-reactive protein is never really been measurably that high in this patient 12/15; not much change in the wound today however he has undermining along the lateral part of the foot again more extensively than last week. He has some rims of epithelialization. We have been using silver alginate. He is undergoing hyperbarics but did not dive today 12/18; in for his obligatory first total contact cast change. Unfortunately there was pus coming from the undermining area around his fifth metatarsal head. This was cultured but will preclude reapplication of a cast. He is seen in conjunction with HBO 12/24; patient had staph lugdunensis in the wound in the undermining area laterally last time. We put him on  doxycycline which should have covered this. The wound looks better today. I am going to give him another week of doxycycline before reattempting the total contact cast 12/31; the patient is completing antibiotics. Hemorrhagic debris in the distal part of the wound with some undermining distally. He also had hyper granulation. Extensive debridement with a #5 curette. The infected area that was on the lateral part of the fifth met head is closed over. I do not think he needs any more antibiotics. Patient was seen prior to HBO. Preparations for a total contact cast were made in the cast will be placed post hyperbarics 04/11/19; once again the patient arrives today without complaint. He had been in a cast all week noted that he had heavy drainage this week. This resulted in large raised areas of macerated tissue around the wound 1/14; wound bed looks better slightly smaller. Hydrofera Blue has been changing himself. He had a heavy drainage last week which caused a lot of maceration around the wound so I took him out of a total contact cast he says the drainage is actually better this week He is seen today in conjunction with HBO 1/21; returns to clinic. He was up in [place] for a day or 2 attending a funeral. He comes back in with the wound larger and with a large area of exposed bone. He had osteomyelitis and septic arthritis of the fifth left metatarsal head while he was in hospital. He received IV antibiotics in the hospital for a prolonged period of time then 3 weeks of Augmentin. Subsequently I gave him 2 weeks of doxycycline for more superficial wound infection. When I saw this last week the wound was smaller the surface of the wound looks satisfactory. 1/28; patient missed hyperbarics today. Bone biopsy I did last time showed Enterococcus faecalis and Staphylococcus lugdunensis . He has a wide area of exposed bone. We are going to use silver alginate as of today. I had another ethical discussion  with the patient. 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 In this situation I think the likelihood of healing this is low. Therefore I have recommended a ray amputation and with the patient's agreement I have referred him to Dr. Doran Durand. The other issue is that his compliance with hyperbarics has been minimal because of his work schedule and given his underlying decision I am going to stop this today READMISSION 10/24/2019 MRI 09/29/2019 left foot IMPRESSION: 1. Apparent skin ulceration inferior and lateral to the 5th metatarsal base with underlying heterogeneous T2 signal  and enhancement in the subcutaneous fat. Small peripherally enhancing fluid collections along the plantar and lateral aspects of the 5th metatarsal base suspicious for abscesses. 2. Interval amputation through the mid 5th metatarsal with nonspecific low-level marrow edema and enhancement. Given the proximity to the adjacent soft tissue inflammatory changes, osteomyelitis cannot be excluded. 3. The additional bones appear unremarkable. MRI 09/29/2019 right foot IMPRESSION: 1. Soft tissue ulceration lateral to the 5th MTP joint. There is low-level T2 hyperintensity within the 4th and 5th metatarsal heads and adjacent proximal phalanges without abnormal T1 signal or cortical destruction. These findings are nonspecific and could be seen with early marrow edema, hyperemia or early osteomyelitis. No evidence of septic joint. 2. Mild tenosynovitis and synovial enhancement associated with the extensor digitorum tendons at the level of the midfoot. 3. Diffuse low-level muscular T2 hyperintensity and enhancement, most consistent with diabetic myopathy. LEFT FOOT BONE Methicillin resistant staphylococcus aureus Staphylococcus lugdunensis MIC MIC CIPROFLOXACIN >=8 RESISTANT Resistant <=0.5 SENSI... Sensitive CLINDAMYCIN <=0.25 SENS... Sensitive >=8 RESISTANT Resistant ERYTHROMYCIN >=8  RESISTANT Resistant >=8 RESISTANT Resistant GENTAMICIN <=0.5 SENSI... Sensitive <=0.5 SENSI... Sensitive Inducible Clindamycin NEGATIVE Sensitive NEGATIVE Sensitive OXACILLIN >=4 RESISTANT Resistant 2 SENSITIVE Sensitive RIFAMPIN <=0.5 SENSI... Sensitive <=0.5 SENSI... Sensitive TETRACYCLINE <=1 SENSITIVE Sensitive <=1 SENSITIVE Sensitive TRIMETH/SULFA <=10 SENSIT Sensitive <=10 SENSIT Sensitive ... Marland Kitchen.. VANCOMYCIN 1 SENSITIVE Sensitive <=0.5 SENSI... Sensitive Right foot bone . Component 3 wk ago Specimen Description BONE Special Requests RIGHT 4 METATARSAL SAMPLE B Gram Stain NO WBC SEEN NO ORGANISMS SEEN Culture RARE METHICILLIN RESISTANT STAPHYLOCOCCUS AUREUS NO ANAEROBES ISOLATED Performed at [facility], [place] [address], [place], [place] [address] Report Status 10/08/2019 FINAL Organism ID, Bacteria METHICILLIN RESISTANT STAPHYLOCOCCUS AUREUS Resulting Agency [facility] Susceptibility Methicillin resistant staphylococcus aureus MIC CIPROFLOXACIN >=8 RESISTANT Resistant CLINDAMYCIN <=0.25 SENS... Sensitive ERYTHROMYCIN >=8 RESISTANT Resistant GENTAMICIN <=0.5 SENSI... Sensitive Inducible Clindamycin NEGATIVE Sensitive OXACILLIN >=4 RESISTANT Resistant RIFAMPIN <=0.5 SENSI... Sensitive TETRACYCLINE <=1 SENSITIVE Sensitive TRIMETH/SULFA <=10 SENSIT Sensitive ... VANCOMYCIN 1 SENSITIVE Sensitive This is a patient we had in clinic earlier this year with a wound over his left fifth metatarsal head. 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 Doran Durand on 05/16/2019. He seems to have developed problems with wounds on his bilateral feet in June 2021 although he may have had problems earlier than  this. He was in an urgent care with a right foot ulcer on 09/26/2019 and given a course of doxycycline. This was apparently after having trouble getting into see orthopedics. He was seen by podiatry on 09/28/2019 noted to have bilateral lower extremity ulcers including the left lateral fifth metatarsal base and the right subfifth met head. It was noted that had purulent drainage at that time. He required hospitalization from 6/20 through 7/2. This was because of worsening right foot wounds. He underwent bilateral operative incision and drainage and bone biopsies bilaterally. Culture results are listed above. He has been referred back to clinic by Dr. Jacqualyn Posey of podiatry. He is also followed by Dr. Megan Salon who saw him yesterday. He was discharged from hospital on Zyvox Flagyl and Levaquin and yesterday changed to doxycycline Flagyl and Levaquin. His inflammatory markers on 6/26 showed a sedimentation rate of 129 and a C-reactive protein of 5. This is  improved to 14 and 1.3 respectively. This would indicate improvement. ABIs in our clinic today were 1.23 on the right and 1.20 on the left 11/01/2019 on evaluation today patient appears to be doing fairly well in regard to the wounds on his feet at this point. Fortunately there is no signs of active infection at this time. No fevers, chills, nausea, vomiting, or diarrhea. He currently is seeing infectious disease and still under their care at this point. Subsequently he also has both wounds which she has not been using collagen on as he did not receive that in his packaging he did not call us and let us know that. Apparently that just was missed on the order. Nonetheless we will get that straightened out today. 8/9-Patient returns for bilateral foot wounds, using Prisma with hydrogel moistened dressings, and the wounds appear stable. Patient using surgical shoes, avoiding much pressure or weightbearing as much as possible 8/16; patient has bilateral foot  wounds. 1 on the right lateral foot proximally the other is on the left mid lateral foot. Both required debridement of callus and thick skin around the wounds. We have been using silver collagen 8/27; patient has bilateral lateral foot wounds. The area on the left substantially surrounded by callus and dry skin. This was removed from the wound edge. The underlying wound is small. The area on the right measured somewhat smaller today. 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 Unfortunately I did not update his antibiotics during today's visit. 9/10 I reviewed Dr. Hale Bogus last notes he felt he had completed antibiotics his inflammatory markers were reasonably well controlled. He has a small wound on the lateral left foot and a tiny area on the right which is just above closed. He is using Hydrofera Blue with border foam he has bilateral surgical shoes 9/24; 2 week f/u. doing well. right foot is closed. left foot still undermined. 10/14; right foot remains closed at the fifth met head. The area over the base of the left fifth metatarsal has a small open area but considerable undermining towards the plantar foot. Thick callus skin around this suggests an adequate pressure relief. We have talked about this. He says he is going to go back into his cam boot. I suggested a total contact cast he did not seem enamored with this suggestion 10/26; left foot base of the fifth metatarsal. Same condition as last time. He has skin over the area with an open wound however the skin is not adherent. He went to see Dr. Earleen Newport who did an x-ray and culture of his foot I have not reviewed the x-ray but the patient was not told anything. He is on doxycycline 11/11; since the patient was last here he was in the emergency room on 10/30 he was concerned about swelling in the left foot. They did not do any cultures or x-rays. They changed his antibiotics to  cephalexin. Previous culture showed group B strep. The cephalexin is appropriate as doxycycline has less than predictable coverage. Arrives in clinic today with swelling over this area under the wound. He also has a new wound on the right fifth metatarsal head 11/18; the patient has a difficult wound on the lateral aspect of the left fifth metatarsal head. The wound was almost ballotable last week I opened it slightly expecting to see purulence however there was just bleeding. I cultured this this was negative. X-ray unchanged. We are trying to get an MRI  but I am not sure were going to be able to get this through his insurance. He also has an area on the right lateral fifth metatarsal head this looks healthier 12/3; the patient finally got our MRI. Surprisingly this did not show osteomyelitis. I did show the soft tissue ulceration at the lateral plantar aspect of the fifth metatarsal base with a tiny residual 6 mm abscess overlying the superficial fascia I have tried to culture this area I have not been able to get this to grow anything. Nevertheless the protruding tissue looks aggravated. I suspect we should try to treat the underlying "abscess with broad-spectrum antibiotics. I am going to start him on Levaquin and Flagyl. He has much less edema in his legs and I am going to continue to wrap his legs and see him weekly 12/10. I started Levaquin and Flagyl on him last week. He just picked up the Flagyl apparently there was some delay. The worry is the wound on the left fifth metatarsal base which is substantial and worsening. His foot looks like he inverts at the ankle making this a weightbearing surface. Certainly no improvement in fact I think the measurements of this are somewhat worse. We have been using 12/17; he apparently just got the Levaquin yesterday this is 2 weeks after the fact. He has completed the Flagyl. The area over the left fifth metatarsal base still has protruding granulation  tissue although it does not look quite as bad as it did some weeks ago. He has severe bilateral lymphedema although we have not been treating him for wounds on his legs this is definitely going to require compression. There was so much edema in the left I did not wish to put him in a total contact cast today. I am going to increase his compression from 3-4 layer. The area on the right lateral fifth met head actually look quite good and superficial. 12/23; patient arrived with callus on the right fifth met head and the substantial hyper granulated callused wound on the base of his fifth metatarsal. He says he is completing his Levaquin in 2 days but I do not think that adds up with what I gave him but I will have to double check this. We are using Hydrofera Blue on both areas. My plan is to put the left leg in a cast the week after New Year's 04/06/2020; patient's wounds about the same. Right lateral fifth metatarsal head and left lateral foot over the base of the fifth metatarsal. There is undermining on the left lateral foot which I removed before application of total contact cast continuing with Hydrofera Blue new. Patient tells me he was seen by endocrinology today lab work was done [Dr. Kerr]. Also wondering whether he was referred to cardiology. I went over some lab work from previously does not have chronic renal failure certainly not nephrotic range proteinuria he does have very poorly controlled diabetes but this is not his most updated lab work. Hemoglobin A1c has been over 11 1/10; the patient had a considerable amount of leakage towards mid part of his left foot with macerated skin however the wound surface looks better the area on the right lateral fifth met head is better as well. I am going to change the dressing on the left foot under the total contact cast to silver alginate, continue with Hydrofera Blue on the right. 1/20; patient was in the total contact cast for 10 days. Considerable  amount of drainage although the skin around  the wound does not look too bad on the left foot. The area on the right fifth metatarsal head is closed. Our nursing staff reports large amount of drainage out of the left lateral foot wound 1/25; continues with copious amounts of drainage described by our intake staff. PCR culture I did last week showed E. coli and Enterococcus faecalis and low quantities. Multiple resistance genes documented including extended spectrum beta lactamase, MRSA, MRSE, quinolone, tetracycline. The wound is not quite as good this week as it was 5 days ago but about the same size 2/3; continues with copious amounts of malodorous drainage per our intake nurse. The PCR culture I did 2 weeks ago showed E. coli and low quantities of Enterococcus. There were multiple resistance genes detected. I put Neosporin on him last week although this does not seem to have helped. The wound is slightly deeper today. Offloading continues to be an issue here although with the amount of drainage she has a total contact cast is just not going to work 2/10; moderate amount of drainage. Patient reports he cannot get his stocking on over the dressing. I told him we have to do that the nurse gave him suggestions on how to make this work. The wound is on the bottom and lateral part of his left foot. Is cultured predominantly grew low amounts of Enterococcus, E. coli and anaerobes. There were multiple resistance genes detected including extended spectrum beta lactamase, quinolone, tetracycline. I could not think of an easy oral combination to address this so for now I am going to do topical antibiotics provided by [facility] I think the main agents here are vancomycin and an aminoglycoside. We have to be able to give him access to the wounds to get the topical antibiotic on 2/17; moderate amount of drainage this is unchanged. He has his Keystone topical antibiotic against the deep tissue culture organisms. He  has been using this and changing the dressing daily. Silver alginate on the wound surface. 2/24; using Keystone antibiotic with silver alginate on the top. He had too much drainage for a total contact cast at one point although I think that is improving and I think in the next week or 2 it might be possible to replace a total contact cast I did not do this today. In general the wound surface looks healthy however he continues to have thick rims of skin and subcutaneous tissue around the wide area of the circumference which I debrided 06/04/2020 upon evaluation today patient appears to be doing well in regard to his wound. I do feel like he is showing signs of improvement. There is little bit of callus and dead tissue around the edges of the wound as well as what appears to be a little bit of a sinus tract that is off to the side laterally I would perform debridement to clear that away today. 3/17; left lateral foot. The wound looks about the same as I remember. Not much depth surface looks healthy. No evidence of infection 3/25; left lateral foot. Wound surface looks about the same. Separating epithelium from the circumference. There really is no evidence of infection here however not making progress by my view 3/29; left lateral foot. Surface of the wound again looks reasonably healthy still thick skin and subcutaneous tissue around the wound margins. There is no evidence of infection. One of the concerns being brought up by the nurses has again the amount of drainage vis--vis continued use of a total contact cast 4/5; left  lateral foot at roughly the base of the fifth metatarsal. Nice healthy looking granulated tissue with rims of epithelialization. The overall wound measurements are not any better but the tissue looks healthy. The only concern is the amount of drainage although he has no surrounding maceration with what we have been doing recently to absorb fluid and protect his skin. He also has  lymphedema. 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 He states that the kerlix/coban under the TCC rolled down and could not get it back up. He has been using an offloading scooter and has somehow hurt his right foot using this device. This happened last week. He states that the side of his right foot developed a blister and opened. The top of his foot also has a few small open wounds he thinks is due to his socks rubbing in his shoes. He has not been using any dressings to the wound. He denies purulent drainage, fever/chills or erythema to the wounds. 4/22; patient presents for 1 week follow-up. He developed new wounds to the right foot that were evaluated at last clinic visit. He continues to have a total contact cast to the left leg and he reports no issues. He has been using silver collagen to the right foot wounds with no issues. He denies purulent drainage, fever/chills or erythema to the right foot wounds. He has no complaints today 4/25; patient presents for 1 week follow-up. He has a total contact cast of the left leg and reports no issues. He has been using silver alginate to the right foot wound. He denies purulent drainage, fever/chills or erythema to the right foot wounds. 5/2 patient presents for 1 week follow-up. T contact cast on the left. The wound which is on the base of the plantar foot at the base of the fifth metatarsal otal actually looks quite good and dimensions continue to gradually contract. HOWEVER the area on the right lateral fifth metatarsal head is much larger than what I remember from 2 weeks ago. Once more is he has significant levels of hypergranulation. Noteworthy that he had this same hyper granulated response on his  wound on the left foot at one point in time. So much so that he I thought there was an underlying fluid collection. Based on this I think this just needs debridement. 5/9; the wound on the left actually continues to be gradually smaller with a healthy surface. Slight amount of drainage and maceration of the skin around but not too bad. However he has a large wound over the right fifth metatarsal head very much in the same configuration as his left foot wound was initially. I used silver nitrate to address the hyper granulated tissue no mechanical debridement 5/16; area on the left foot did not look as healthy this week deeper thick surrounding macerated skin and subcutaneous tissue. The area on the right foot fifth met head was about the same The area on the right ankle that we identified last week is completely broken down into an open wound presumably a stocking rubbing issue 5/23; patient has been using a total contact cast to the left side. He has been using silver alginate underneath. He has also been using silver alginate to the right foot  wounds. He has no complaints today. He denies any signs of infection. 5/31; the left-sided wound looks some better measure smaller surface granulation looks better. We have been using silver alginate under the total contact cast The large area on his right fifth met head and right dorsal foot look about the same still using silver alginate 6/6; neither side is good as I was hoping although the surface area dimensions are better. A lot of maceration on his left and right foot around the wound edge. Area on the dorsal right foot looks better. He says he was traveling. I am not sure what does the amount of maceration around the plantar wounds may be drainage issues 6/13; in general the wound surfaces look quite good on both sides. Macerated skin and raised edges around the wound required debridement although in general especially on the left the surface area  seems improved. The area on the right dorsal ankle is about the same I thought this would not be such a problem to close 6/20; not much change in either wound although the one on the right looks a little better. Both wounds have thick macerated edges to the skin requiring debridements. We have been using silver alginate. The area on the dorsal right ankle is still open I thought this would be closed. 6/28; patient comes in today with a marked deterioration in the right foot wound fifth met head. Wide area of exposed bone this is a drastic change from last time. The area on the left there we have been casting is stagnant. We have been using silver alginate in both wound areas. 7/5; bone culture I did for PCR last time was positive for Pseudomonas, group B strep, Enterococcus and Staph aureus. There was no suggestion of methicillin resistance or ampicillin resistant genes. This was resistant to tetracycline however He comes into the clinic today with the area over his right plantar fifth metatarsal head which had been doing so well 2 weeks ago completely necrotic feeling bone. I do not know that this is going to be salvageable. The left foot wound is certainly no smaller but it has a better surface and is superficial. 7/8; patient called in this morning to say that his total contact cast was rubbing against his foot. He states he is doing fine overall. He denies signs of infection. 7/12; continued deterioration in the wound over the right fifth metatarsal head crumbling bone. This is not going to be salvageable. The patient agrees and wants to be referred to Dr. Doran Durand which we will attempt to arrange as soon as possible. I am going to continue him on antibiotics as long as that takes so I will renew those today. The area on the left foot which is the base of the fifth metatarsal continues to look somewhat better. Healthy looking tissue no depth no debridement is necessary here. Electronic  Signature(s) Signed: 10/13/2020 5:25:56 PM By: [name] MD Entered By: [name] on 10/13/2020 16:59:14 -------------------------------------------------------------------------------- Physical Exam Details Patient Name: Date of Service: [name], [name] 10/13/2020 3:00 PM Medical Record Number: [id number] Patient Account Number: [id number] Date of Birth/Sex: Treating RN: 09/14/1985 (35 y.o. [name] Primary Care Provider: Other Clinician: Seward [name] Referring Provider: Treating Provider/Extender: [name] in Treatment: 60 Constitutional Sitting or standing Blood Pressure is within target range for patient.. Pulse regular and within target range for patient.Marland Kitchen Respirations regular, non-labored and within target range.. Temperature is normal and within the target range for  the patient.Marland Kitchen Appears in no distress. Notes Wound exam At the base of the left fifth metatarsal a clean healthy looking wound continuing to get better. Minimal depth no evidence of infection The right fifth metatarsal wound has deteriorated necrotic bone in the center of this. Pieces of bone falling out of this. This is not going to last and is not going to be salvageable. He does not look toxic there is no surrounding infection Electronic Signature(s) Signed: 10/13/2020 5:25:56 PM By: [name] MD Entered By: [name] on 10/13/2020 17:01:53 -------------------------------------------------------------------------------- Physician Orders Details Patient Name: Date of Service: [name], [name] 10/13/2020 3:00 PM Medical Record Number: [id number] Patient Account Number: [id number] Date of Birth/Sex: Treating RN: Mar 28, 1986 (35 y.o. [name], [name] Primary Care Provider: Seward [name] Other Clinician: Referring Provider: Treating Provider/Extender: [name] in Treatment: 29 Verbal / Phone Orders: No Diagnosis  Coding Follow-up Appointments ppointment in 1 week. - ***60 minutes for cast*** - with Dr. Dellia Nims Return A Other: - We are referring you to Dr. Doran Durand for a positive PCR bone culture of your right lateral foot. They will call you to schedule an appt. Bathing/ Shower/ Hygiene May shower with protection but do not get wound dressing(s) wet. - use a cast protector to left leg. Edema Control - Lymphedema / SCD / Other Bilateral Lower Extremities Elevate legs to the level of the heart or above for 30 minutes daily and/or when sitting, a frequency of: - throughout the day Avoid standing for long periods of time. Exercise regularly Moisturize legs daily. - right leg every night before bed. Compression stocking or Garment 20-30 mm/Hg pressure to: - Apply to right leg in the morning and remove at night. Off-Loading Total Contact Cast to Left Lower Extremity Open toe surgical shoe to: - right foot Wound Treatment Wound #3 - Foot Wound Laterality: Left, Lateral Cleanser: Soap and Water 1 x Per Week/30 Days Discharge Instructions: May shower and wash wound with dial antibacterial soap and water prior to dressing change. Peri-Wound Care: Zinc Oxide Ointment 30g tube 1 x Per Week/30 Days Discharge Instructions: Apply Zinc Oxide to periwound with each dressing change as needed. Prim Dressing: Hydrofera Blue Classic Foam, 2x2 in 1 x Per Week/30 Days ary Discharge Instructions: Moisten with saline prior to applying to wound bed Secondary Dressing: Woven Gauze Sponge, Non-Sterile 4x4 in 1 x Per Week/30 Days Discharge Instructions: Apply over primary dressing as directed. Secondary Dressing: ABD Pad, 8x10 1 x Per Week/30 Days Discharge Instructions: T down before applying kerlix. Apply over primary dressing as directed. ape Secondary Dressing: Zetuvit Plus 4x8 in 1 x Per Week/30 Days Discharge Instructions: Apply over primary dressing as directed. Secured With: [employer], 4.5x3.1 (in/yd) 1 x  Per Week/30 Days Discharge Instructions: Secure with Kerlix as directed. Compression Wrap: Kerlix Roll 4.5x3.1 (in/yd) 1 x Per Week/30 Days Discharge Instructions: Apply Kerlix and Coban compression under TCC. Compression Wrap: Coban Self-Adherent Wrap 4x5 (in/yd) 1 x Per Week/30 Days Discharge Instructions: Apply over Kerlix as directed. Wound #5 - Metatarsal head fifth Wound Laterality: Right, Lateral Cleanser: Normal Saline (DME) (Generic) Every Other Day/15 Days Discharge Instructions: Cleanse the wound with Normal Saline prior to applying a clean dressing using gauze sponges, not tissue or cotton balls. Cleanser: Soap and Water Every Other Day/15 Days Discharge Instructions: May shower and wash wound with dial antibacterial soap and water prior to dressing change. Prim Dressing: KerraCel Ag Gelling Fiber Dressing, 2x2 in (silver alginate) (DME) (  Generic) Every Other Day/15 Days ary Discharge Instructions: Apply silver alginate to wound bed as instructed Secondary Dressing: ABD Pad, 5x9 (DME) (Generic) Every Other Day/15 Days Discharge Instructions: Apply over primary dressing as directed. Secondary Dressing: Optifoam Non-Adhesive Dressing, 4x4 in (DME) (Generic) Every Other Day/15 Days Discharge Instructions: Apply over primary dressing, cut to make foam donut to offload Secured With: Kerlix Roll Sterile, 4.5x3.1 (in/yd) (DME) (Generic) Every Other Day/15 Days Discharge Instructions: Secure with Kerlix as directed. Secured With: 78M Medipore H Soft Cloth Surgical Tape, 2x2 (in/yd) (DME) (Generic) Every Other Day/15 Days Discharge Instructions: Secure dressing with tape as directed. Wound #7 - Ankle Wound Laterality: Right, Anterior Cleanser: Normal Saline (DME) (Generic) Every Other Day/15 Days Discharge Instructions: Cleanse the wound with Normal Saline prior to applying a clean dressing using gauze sponges, not tissue or cotton balls. Cleanser: Soap and Water Every Other Day/15  Days Discharge Instructions: May shower and wash wound with dial antibacterial soap and water prior to dressing change. Prim Dressing: KerraCel Ag Gelling Fiber Dressing, 2x2 in (silver alginate) (DME) (Generic) Every Other Day/15 Days ary Discharge Instructions: Apply silver alginate to wound bed as instructed Secondary Dressing: Woven Gauze Sponge, Non-Sterile 4x4 in (DME) (Generic) Every Other Day/15 Days Discharge Instructions: Apply over primary dressing as directed. Secondary Dressing: ABD Pad, 5x9 (DME) (Generic) Every Other Day/15 Days Discharge Instructions: Apply over primary dressing as directed. Secured With: [employer], 4.5x3.1 (in/yd) (DME) (Generic) Every Other Day/15 Days Discharge Instructions: Secure with Kerlix as directed. Secured With: 78M Medipore H Soft Cloth Surgical Tape, 2x2 (in/yd) (DME) (Generic) Every Other Day/15 Days Discharge Instructions: Secure dressing with tape as directed. Custom Services Referral - Urgent referral to Dr. Doran Durand for a possible right foot ray amputation r/t positive PCR bone culture of the right lateral foot Patient Medications llergies: metformin A Notifications Medication Indication Start End wound infection 10/13/2020 Augmentin DOSE oral 875 mg-125 mg tablet - 1 tablet oral bid for 2 weeks (continuing rx) wound infection 10/13/2020 Cipro DOSE oral 500 mg tablet - 1 tablet oral bid for 2 weeks (continuing rx) Electronic Signature(s) Signed: 10/13/2020 5:12:50 PM By: [name] MD Entered By: [name] on 10/13/2020 17:12:48 Prescription 10/13/2020 -------------------------------------------------------------------------------- Stineman, Max Mirl Hillery MD Patient Name: Provider: Sep 09, 1985 [id number] Date of Birth: NPI#: [id number] Sex: DEA #: [id number] Phone #: License #: [place] Patient Address: [address] [place], [place]  [address] [place], [place] [address] [phone] Allergies metformin Provider's Orders Referral - Urgent referral to Dr. Doran Durand for a possible right foot ray amputation r/t positive PCR bone culture of the right lateral foot Hand Signature: Date(s): Electronic Signature(s) Signed: 10/13/2020 5:25:56 PM By: [name] MD Entered By: [name] on 10/13/2020 17:12:51 -------------------------------------------------------------------------------- Problem List Details Patient Name: Date of Service: [name], [name] 10/13/2020 3:00 PM Medical Record Number: [id number] Patient Account Number: [id number] Date of Birth/Sex: Treating RN: [birth year] (35 y.o. [name] Primary Care Provider: Seward [name] Other Clinician: Referring Provider: Treating Provider/Extender: [name] in Treatment: 5 Active Problems ICD-10 Encounter Code Description Active Date MDM Diagnosis E11.621 Type 2 diabetes mellitus with foot ulcer 10/24/2019 No Yes L97.528 Non-pressure chronic ulcer of other part of left foot with other specified 10/24/2019 No Yes severity L97.518 Non-pressure chronic ulcer of other part of right foot with other specified 07/14/2020 No Yes severity L97.318 Non-pressure chronic ulcer of right ankle with other  specified severity 08/10/2020 No Yes Inactive Problems ICD-10 Code Description Active Date Inactive Date L97.518 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ity 10/24/2019 10/24/2019 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, right ankle and foot 10/24/2019 10/24/2019 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, left ankle and foot 10/24/2019 10/24/2019 B95.62 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 10/24/2019 10/24/2019 classified elsewhere Resolved Problems Electronic Signature(s) Signed: 10/13/2020 5:25:56 PM By: [name] MD Entered By: [name] on 10/13/2020 16:56:44 -------------------------------------------------------------------------------- Progress Note Details Patient Name: Date of Service: [name], [name] 10/13/2020 3:00 PM Medical Record Number: [id number] Patient Account Number: [id number] Date of Birth/Sex: Treating RN: 05/17/1985 (35 y.o. 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 He comes in for review of a wound over the plantar fifth metatarsal head extending into the lateral part of the foot. He was followed for this previously by his podiatrist Dr. Cornelius [name]. As the patient tells his story he went to see podiatry first for a swelling he developed on the lateral part of his fifth metatarsal head in May. He states this was "open" by podiatry and the area closed. He was followed up in June and it was again opened callus removed and it closed promptly. There were plans being made for surgery on the fifth metatarsal head in June however his blood sugar was apparently too high for anesthesia. Apparently the area was debrided and opened again in June and it is never closed since. Looking over the records from podiatry I am really not able to follow this. It was clear when he was first seen it was before 5/14 at that point he already had a wound. By 5/17 the ulcer was resolved. I do not see anything about a procedure. On 5/28 noted to have pre-ulcerative moderate keratosis. X-ray noted 1/5 contracted toe and tailor's bunion and metatarsal deformity. On a visit date on 09/28/2018 the dorsal part of the left foot it healed and resolved. There was concern about swelling in his lower extremity he was sent to the ER.. As far as I can tell he was  seen in the ER on 7/12 with an ulcer on his left foot. A DVT rule out of the left leg was negative. I do not think I have complete records from podiatry but I am not able to verify the procedures this patient states he had. He states after the last procedure the wound has never closed although I am not able to follow this in the records I have from podiatry. He has not had a recent x-ray The patient has been using Neosporin on the wound. He is wearing a [name] shoe. He is still very active up on his foot working and exercising. Past medical history; type 2 diabetes ketosis-prone, leg swelling with a negative DVT study in July. Non-smoker ABI in our clinic was 0.85 on the left 10/16; substantial wound on the plantar left fifth met head extending laterally almost to the dorsal fifth MTP. We have been using silver alginate we gave him a [name] forefoot off loader. An x-ray did not show evidence of osteomyelitis did note soft tissue emphysema which I think was due to gas tracking through an open wound. There is no doubt in my mind he requires  an MRI 10/23; MRI not booked until 3 November at the earliest this is largely due to his glucose sensor in the right arm. We have been using silver alginate. There has been an improvement 10/29; I am still not exactly sure when his MRI is booked for. He says it is the third but it is the 10th in epic. This definitely needs to be done. He is running a low-grade fever today but no other symptoms. No real improvement in the 1 02/26/2019 patient presents today for a follow-up visit here in our clinic he is last been seen in the clinic on October 29. Subsequently we were working on getting MRI to evaluate and see what exactly was going on and where we would need to go from the standpoint of whether or not he had osteomyelitis and again what treatments were going be required. Subsequently the patient ended up being admitted to the hospital on 02/07/2019 and was discharged  on 02/14/2019. This is a somewhat interesting admission with a discharge diagnosis of pneumonia due to COVID-19 although he was positive for COVID-19 when tested at the urgent care but negative x2 when he was actually in the hospital. With that being said he did have acute respiratory failure with hypoxia and it was noted he also have a left foot ulceration with osteomyelitis. With that being said he did require oxygen for his pneumonia and I level 4 L. He was placed on antivirals and steroids for the COVID-19. He was also transferred to the [facility] at one point. Nonetheless he did subsequently discharged home and since being home has done much better in that regard. The CT angiogram did not show any pulmonary embolism. With regard to the osteomyelitis the patient was placed on vancomycin and Zosyn while in the hospital but has been changed to Augmentin at discharge. It was also recommended that he follow- up with wound care and podiatry. Podiatry however wanted him to see [name] according to the patient prior to them doing anything further. His hemoglobin A1c was 9.9 as noted in the hospital. Have an MRI of the left foot performed while in the hospital on 02/04/2019. 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 There is an overlying plantar open wound noted an abscess tracking back along the lateral aspect of the fifth metatarsal shaft. There is otherwise diffuse cellulitis and mild fasciitis without findings of polymyositis. The patient did have recently pneumonia secondary to COVID-19 I looked in the chart through epic and it does appear that the patient may need to have an additional x-ray just to ensure everything is cleared and that he has no airspace disease prior to putting him into the chamber. 03/05/2019; patient was readmitted to the clinic last week. He was hospitalized twice for a viral  upper respiratory tract infection from 11/1 through 11/4 and then 11/5 through 11/12 ultimately this turned out to be Covid pneumonitis. Although he was discharged on oxygen he is not using it. He says he feels fine. He has no exercise limitation no cough no sputum. His O2 sat in our clinic today was 100% on room air. 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 He received Vanco and Zosyn in the hospital and then was discharged on 2 weeks of Augmentin. I do not see any relevant cultures.  He was supposed to follow-up with infectious disease but I do not see that he has an appointment. 12/8; patient saw Dr. Novella Olive of infectious disease last week. He felt that he had had adequate antibiotic therapy. He did not go to follow-up with Dr. Amalia Hailey of podiatry and I have again talked to him about the pros and cons of this. He does not want to consider a ray amputation of this time. He is aware of the risks of recurrence, migration etc. He started HBO today and tolerated this well. He can complete the Augmentin that I gave him last week. I have looked over the lab work that Dr. Chana Bode ordered his C-reactive protein was 3.3 and his sedimentation rate was 17. The C-reactive protein is never really been measurably that high in this patient 12/15; not much change in the wound today however he has undermining along the lateral part of the foot again more extensively than last week. He has some rims of epithelialization. We have been using silver alginate. He is undergoing hyperbarics but did not dive today 12/18; in for his obligatory first total contact cast change. Unfortunately there was pus coming from the undermining area around his fifth metatarsal head. This was cultured but will preclude reapplication of a cast. He is seen in conjunction with HBO 12/24; patient had staph lugdunensis in the wound  in the undermining area laterally last time. We put him on doxycycline which should have covered this. The wound looks better today. I am going to give him another week of doxycycline before reattempting the total contact cast 12/31; the patient is completing antibiotics. Hemorrhagic debris in the distal part of the wound with some undermining distally. He also had hyper granulation. Extensive debridement with a #5 curette. The infected area that was on the lateral part of the fifth met head is closed over. I do not think he needs any more antibiotics. Patient was seen prior to HBO. Preparations for a total contact cast were made in the cast will be placed post hyperbarics 04/11/19; once again the patient arrives today without complaint. He had been in a cast all week noted that he had heavy drainage this week. This resulted in large raised areas of macerated tissue around the wound 1/14; wound bed looks better slightly smaller. Hydrofera Blue has been changing himself. He had a heavy drainage last week which caused a lot of maceration around the wound so I took him out of a total contact cast he says the drainage is actually better this week He is seen today in conjunction with HBO 1/21; returns to clinic. He was up in [place] for a day or 2 attending a funeral. He comes back in with the wound larger and with a large area of exposed bone. He had osteomyelitis and septic arthritis of the fifth left metatarsal head while he was in hospital. He received IV antibiotics in the hospital for a prolonged period of time then 3 weeks of Augmentin. Subsequently I gave him 2 weeks of doxycycline for more superficial wound infection. When I saw this last week the wound was smaller the surface of the wound looks satisfactory. 1/28; patient missed hyperbarics today. Bone biopsy I did last time showed Enterococcus faecalis and Staphylococcus lugdunensis . He has a wide area of exposed bone. We are going to use silver  alginate as of today. I had another ethical discussion with the patient. 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 In this situation  I think the likelihood of healing this is low. Therefore I have recommended a ray amputation and with the patient's agreement I have referred him to Dr. Doran Durand. The other issue is that his compliance with hyperbarics has been minimal because of his work schedule and given his underlying decision I am going to stop this today READMISSION 10/24/2019 MRI 09/29/2019 left foot IMPRESSION: 1. Apparent skin ulceration inferior and lateral to the 5th metatarsal base with underlying heterogeneous T2 signal and enhancement in the subcutaneous fat. Small peripherally enhancing fluid collections along the plantar and lateral aspects of the 5th metatarsal base suspicious for abscesses. 2. Interval amputation through the mid 5th metatarsal with nonspecific low-level marrow edema and enhancement. Given the proximity to the adjacent soft tissue inflammatory changes, osteomyelitis cannot be excluded. 3. The additional bones appear unremarkable. MRI 09/29/2019 right foot IMPRESSION: 1. Soft tissue ulceration lateral to the 5th MTP joint. There is low-level T2 hyperintensity within the 4th and 5th metatarsal heads and adjacent proximal phalanges without abnormal T1 signal or cortical destruction. These findings are nonspecific and could be seen with early marrow edema, hyperemia or early osteomyelitis. No evidence of septic joint. 2. Mild tenosynovitis and synovial enhancement associated with the extensor digitorum tendons at the level of the midfoot. 3. Diffuse low-level muscular T2 hyperintensity and enhancement, most consistent with diabetic myopathy. LEFT FOOT BONE Methicillin resistant staphylococcus aureus Staphylococcus lugdunensis MIC MIC CIPROFLOXACIN >=8 RESISTANT Resistant <=0.5 SENSI... Sensitive CLINDAMYCIN <=0.25 SENS...  Sensitive >=8 RESISTANT Resistant ERYTHROMYCIN >=8 RESISTANT Resistant >=8 RESISTANT Resistant GENTAMICIN <=0.5 SENSI... Sensitive <=0.5 SENSI... Sensitive Inducible Clindamycin NEGATIVE Sensitive NEGATIVE Sensitive OXACILLIN >=4 RESISTANT Resistant 2 SENSITIVE Sensitive RIFAMPIN <=0.5 SENSI... Sensitive <=0.5 SENSI... Sensitive TETRACYCLINE <=1 SENSITIVE Sensitive <=1 SENSITIVE Sensitive TRIMETH/SULFA <=10 SENSIT Sensitive <=10 SENSIT Sensitive ... Marland Kitchen.. VANCOMYCIN 1 SENSITIVE Sensitive <=0.5 SENSI... Sensitive Right foot bone . Component 3 wk ago Specimen Description BONE Special Requests RIGHT 4 METATARSAL SAMPLE B Gram Stain NO WBC SEEN NO ORGANISMS SEEN Culture RARE METHICILLIN RESISTANT STAPHYLOCOCCUS AUREUS NO ANAEROBES ISOLATED Performed at [facility], [place] [address], [place], [place] [address] Report Status 10/08/2019 FINAL Organism ID, Bacteria METHICILLIN RESISTANT STAPHYLOCOCCUS AUREUS Resulting Agency [facility] Susceptibility Methicillin resistant staphylococcus aureus MIC CIPROFLOXACIN >=8 RESISTANT Resistant CLINDAMYCIN <=0.25 SENS... Sensitive ERYTHROMYCIN >=8 RESISTANT Resistant GENTAMICIN <=0.5 SENSI... Sensitive Inducible Clindamycin NEGATIVE Sensitive OXACILLIN >=4 RESISTANT Resistant RIFAMPIN <=0.5 SENSI... Sensitive TETRACYCLINE <=1 SENSITIVE Sensitive TRIMETH/SULFA <=10 SENSIT Sensitive ... VANCOMYCIN 1 SENSITIVE Sensitive This is a patient we had in clinic earlier this year with a wound over his left fifth metatarsal head. 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 Doran Durand on 05/16/2019. He seems to have developed problems with wounds on his bilateral feet in June 2021  although he may have had problems earlier than this. He was in an urgent care with a right foot ulcer on 09/26/2019 and given a course of doxycycline. This was apparently after having trouble getting into see orthopedics. He was seen by podiatry on 09/28/2019 noted to have bilateral lower extremity ulcers including the left lateral fifth metatarsal base and the right subfifth met head. It was noted that had purulent drainage at that time. He required hospitalization from 6/20 through 7/2. This was because  of worsening right foot wounds. He underwent bilateral operative incision and drainage and bone biopsies bilaterally. Culture results are listed above. He has been referred back to clinic by Dr. Jacqualyn Posey of podiatry. He is also followed by Dr. Megan Salon who saw him yesterday. He was discharged from hospital on Zyvox Flagyl and Levaquin and yesterday changed to doxycycline Flagyl and Levaquin. His inflammatory markers on 6/26 showed a sedimentation rate of 129 and a C-reactive protein of 5. This is improved to 14 and 1.3 respectively. This would indicate improvement. ABIs in our clinic today were 1.23 on the right and 1.20 on the left 11/01/2019 on evaluation today patient appears to be doing fairly well in regard to the wounds on his feet at this point. Fortunately there is no signs of active infection at this time. No fevers, chills, nausea, vomiting, or diarrhea. He currently is seeing infectious disease and still under their care at this point. Subsequently he also has both wounds which she has not been using collagen on as he did not receive that in his packaging he did not call us and let us know that. Apparently that just was missed on the order. Nonetheless we will get that straightened out today. 8/9-Patient returns for bilateral foot wounds, using Prisma with hydrogel moistened dressings, and the wounds appear stable. Patient using surgical shoes, avoiding much pressure or weightbearing as much as  possible 8/16; patient has bilateral foot wounds. 1 on the right lateral foot proximally the other is on the left mid lateral foot. Both required debridement of callus and thick skin around the wounds. We have been using silver collagen 8/27; patient has bilateral lateral foot wounds. The area on the left substantially surrounded by callus and dry skin. This was removed from the wound edge. The underlying wound is small. The area on the right measured somewhat smaller today. 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 Unfortunately I did not update his antibiotics during today's visit. 9/10 I reviewed Dr. Hale Bogus last notes he felt he had completed antibiotics his inflammatory markers were reasonably well controlled. He has a small wound on the lateral left foot and a tiny area on the right which is just above closed. He is using Hydrofera Blue with border foam he has bilateral surgical shoes 9/24; 2 week f/u. doing well. right foot is closed. left foot still undermined. 10/14; right foot remains closed at the fifth met head. The area over the base of the left fifth metatarsal has a small open area but considerable undermining towards the plantar foot. Thick callus skin around this suggests an adequate pressure relief. We have talked about this. He says he is going to go back into his cam boot. I suggested a total contact cast he did not seem enamored with this suggestion 10/26; left foot base of the fifth metatarsal. Same condition as last time. He has skin over the area with an open wound however the skin is not adherent. He went to see Dr. Earleen Newport who did an x-ray and culture of his foot I have not reviewed the x-ray but the patient was not told anything. He is on doxycycline 11/11; since the patient was last here he was in the emergency room on 10/30 he was concerned about swelling in the left foot. They did not do any cultures  or x-rays. They changed his antibiotics to cephalexin. Previous culture showed group B strep. The cephalexin is appropriate as doxycycline  has less than predictable coverage. Arrives in clinic today with swelling over this area under the wound. He also has a new wound on the right fifth metatarsal head 11/18; the patient has a difficult wound on the lateral aspect of the left fifth metatarsal head. The wound was almost ballotable last week I opened it slightly expecting to see purulence however there was just bleeding. I cultured this this was negative. X-ray unchanged. We are trying to get an MRI but I am not sure were going to be able to get this through his insurance. He also has an area on the right lateral fifth metatarsal head this looks healthier 12/3; the patient finally got our MRI. Surprisingly this did not show osteomyelitis. I did show the soft tissue ulceration at the lateral plantar aspect of the fifth metatarsal base with a tiny residual 6 mm abscess overlying the superficial fascia I have tried to culture this area I have not been able to get this to grow anything. Nevertheless the protruding tissue looks aggravated. I suspect we should try to treat the underlying "abscess with broad-spectrum antibiotics. I am going to start him on Levaquin and Flagyl. He has much less edema in his legs and I am going to continue to wrap his legs and see him weekly 12/10. I started Levaquin and Flagyl on him last week. He just picked up the Flagyl apparently there was some delay. The worry is the wound on the left fifth metatarsal base which is substantial and worsening. His foot looks like he inverts at the ankle making this a weightbearing surface. Certainly no improvement in fact I think the measurements of this are somewhat worse. We have been using 12/17; he apparently just got the Levaquin yesterday this is 2 weeks after the fact. He has completed the Flagyl. The area over the left fifth  metatarsal base still has protruding granulation tissue although it does not look quite as bad as it did some weeks ago. He has severe bilateral lymphedema although we have not been treating him for wounds on his legs this is definitely going to require compression. There was so much edema in the left I did not wish to put him in a total contact cast today. I am going to increase his compression from 3-4 layer. The area on the right lateral fifth met head actually look quite good and superficial. 12/23; patient arrived with callus on the right fifth met head and the substantial hyper granulated callused wound on the base of his fifth metatarsal. He says he is completing his Levaquin in 2 days but I do not think that adds up with what I gave him but I will have to double check this. We are using Hydrofera Blue on both areas. My plan is to put the left leg in a cast the week after New Year's 04/06/2020; patient's wounds about the same. Right lateral fifth metatarsal head and left lateral foot over the base of the fifth metatarsal. There is undermining on the left lateral foot which I removed before application of total contact cast continuing with Hydrofera Blue new. Patient tells me he was seen by endocrinology today lab work was done [Dr. Kerr]. Also wondering whether he was referred to cardiology. I went over some lab work from previously does not have chronic renal failure certainly not nephrotic range proteinuria he does have very poorly controlled diabetes but this is not his most updated lab work. Hemoglobin A1c has been over 11 1/10; the patient  had a considerable amount of leakage towards mid part of his left foot with macerated skin however the wound surface looks better the area on the right lateral fifth met head is better as well. I am going to change the dressing on the left foot under the total contact cast to silver alginate, continue with Hydrofera Blue on the right. 1/20; patient was in  the total contact cast for 10 days. Considerable amount of drainage although the skin around the wound does not look too bad on the left foot. The area on the right fifth metatarsal head is closed. Our nursing staff reports large amount of drainage out of the left lateral foot wound 1/25; continues with copious amounts of drainage described by our intake staff. PCR culture I did last week showed E. coli and Enterococcus faecalis and low quantities. Multiple resistance genes documented including extended spectrum beta lactamase, MRSA, MRSE, quinolone, tetracycline. The wound is not quite as good this week as it was 5 days ago but about the same size 2/3; continues with copious amounts of malodorous drainage per our intake nurse. The PCR culture I did 2 weeks ago showed E. coli and low quantities of Enterococcus. There were multiple resistance genes detected. I put Neosporin on him last week although this does not seem to have helped. The wound is slightly deeper today. Offloading continues to be an issue here although with the amount of drainage she has a total contact cast is just not going to work 2/10; moderate amount of drainage. Patient reports he cannot get his stocking on over the dressing. I told him we have to do that the nurse gave him suggestions on how to make this work. The wound is on the bottom and lateral part of his left foot. Is cultured predominantly grew low amounts of Enterococcus, E. coli and anaerobes. There were multiple resistance genes detected including extended spectrum beta lactamase, quinolone, tetracycline. I could not think of an easy oral combination to address this so for now I am going to do topical antibiotics provided by [facility] I think the main agents here are vancomycin and an aminoglycoside. We have to be able to give him access to the wounds to get the topical antibiotic on 2/17; moderate amount of drainage this is unchanged. He has his Keystone topical  antibiotic against the deep tissue culture organisms. He has been using this and changing the dressing daily. Silver alginate on the wound surface. 2/24; using Keystone antibiotic with silver alginate on the top. He had too much drainage for a total contact cast at one point although I think that is improving and I think in the next week or 2 it might be possible to replace a total contact cast I did not do this today. In general the wound surface looks healthy however he continues to have thick rims of skin and subcutaneous tissue around the wide area of the circumference which I debrided 06/04/2020 upon evaluation today patient appears to be doing well in regard to his wound. I do feel like he is showing signs of improvement. There is little bit of callus and dead tissue around the edges of the wound as well as what appears to be a little bit of a sinus tract that is off to the side laterally I would perform debridement to clear that away today. 3/17; left lateral foot. The wound looks about the same as I remember. Not much depth surface looks healthy. No evidence of infection 3/25; left lateral  foot. Wound surface looks about the same. Separating epithelium from the circumference. There really is no evidence of infection here however not making progress by my view 3/29; left lateral foot. Surface of the wound again looks reasonably healthy still thick skin and subcutaneous tissue around the wound margins. There is no evidence of infection. One of the concerns being brought up by the nurses has again the amount of drainage vis--vis continued use of a total contact cast 4/5; left lateral foot at roughly the base of the fifth metatarsal. Nice healthy looking granulated tissue with rims of epithelialization. The overall wound measurements are not any better but the tissue looks healthy. The only concern is the amount of drainage although he has no surrounding maceration with what we have been doing  recently to absorb fluid and protect his skin. He also has lymphedema. 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 He states that the kerlix/coban under the TCC rolled down and could not get it back up. He has been using an offloading scooter and has somehow hurt his right foot using this device. This happened last week. He states that the side of his right foot developed a blister and opened. The top of his foot also has a few small open wounds he thinks is due to his socks rubbing in his shoes. He has not been using any dressings to the wound. He denies purulent drainage, fever/chills or erythema to the wounds. 4/22; patient presents for 1 week follow-up. He developed new wounds to the right foot that were evaluated at last clinic visit. He continues to have a total contact cast to the left leg and he reports no issues. He has been using silver collagen to the right foot wounds with no issues. He denies purulent drainage, fever/chills or erythema to the right foot wounds. He has no complaints today 4/25; patient presents for 1 week follow-up. He has a total contact cast of the left leg and reports no issues. He has been using silver alginate to the right foot wound. He denies purulent drainage, fever/chills or erythema to the right foot wounds. 5/2 patient presents for 1 week follow-up. T contact cast on the left. The wound which is on the base of the plantar foot at the base of the fifth metatarsal otal actually looks quite good and dimensions continue to gradually contract. HOWEVER the area on the right lateral fifth metatarsal head is much larger than what I remember from 2 weeks ago. Once more is he has significant levels of hypergranulation.  Noteworthy that he had this same hyper granulated response on his wound on the left foot at one point in time. So much so that he I thought there was an underlying fluid collection. Based on this I think this just needs debridement. 5/9; the wound on the left actually continues to be gradually smaller with a healthy surface. Slight amount of drainage and maceration of the skin around but not too bad. However he has a large wound over the right fifth metatarsal head very much in the same configuration as his left foot wound was initially. I used silver nitrate to address the hyper granulated tissue no mechanical debridement 5/16; area on the left foot did not  look as healthy this week deeper thick surrounding macerated skin and subcutaneous tissue. oo The area on the right foot fifth met head was about the same oo The area on the right ankle that we identified last week is completely broken down into an open wound presumably a stocking rubbing issue 5/23; patient has been using a total contact cast to the left side. He has been using silver alginate underneath. He has also been using silver alginate to the right foot wounds. He has no complaints today. He denies any signs of infection. 5/31; the left-sided wound looks some better measure smaller surface granulation looks better. We have been using silver alginate under the total contact cast oo The large area on his right fifth met head and right dorsal foot look about the same still using silver alginate 6/6; neither side is good as I was hoping although the surface area dimensions are better. A lot of maceration on his left and right foot around the wound edge. Area on the dorsal right foot looks better. He says he was traveling. I am not sure what does the amount of maceration around the plantar wounds may be drainage issues 6/13; in general the wound surfaces look quite good on both sides. Macerated skin and raised edges around the wound  required debridement although in general especially on the left the surface area seems improved. oo The area on the right dorsal ankle is about the same I thought this would not be such a problem to close 6/20; not much change in either wound although the one on the right looks a little better. Both wounds have thick macerated edges to the skin requiring debridements. We have been using silver alginate. The area on the dorsal right ankle is still open I thought this would be closed. 6/28; patient comes in today with a marked deterioration in the right foot wound fifth met head. Wide area of exposed bone this is a drastic change from last time. The area on the left there we have been casting is stagnant. We have been using silver alginate in both wound areas. 7/5; bone culture I did for PCR last time was positive for Pseudomonas, group B strep, Enterococcus and Staph aureus. There was no suggestion of methicillin resistance or ampicillin resistant genes. This was resistant to tetracycline however He comes into the clinic today with the area over his right plantar fifth metatarsal head which had been doing so well 2 weeks ago completely necrotic feeling bone. I do not know that this is going to be salvageable. The left foot wound is certainly no smaller but it has a better surface and is superficial. 7/8; patient called in this morning to say that his total contact cast was rubbing against his foot. He states he is doing fine overall. He denies signs of infection. 7/12; continued deterioration in the wound over the right fifth metatarsal head crumbling bone. This is not going to be salvageable. The patient agrees and wants to be referred to Dr. Doran Durand which we will attempt to arrange as soon as possible. I am going to continue him on antibiotics as long as that takes so I will renew those today. The area on the left foot which is the base of the fifth metatarsal continues to look somewhat better.  Healthy looking tissue no depth no debridement is necessary here. Objective Constitutional Sitting or standing Blood Pressure is within target range for patient.. Pulse regular and within target range for patient.Marland Kitchen Respirations regular,  non-labored and within target range.. Temperature is normal and within the target range for the patient.Marland Kitchen Appears in no distress. Vitals Time Taken: 3:45 PM, Height: 77 in, Weight: 280 lbs, BMI: 33.2, Temperature: 98.3 F, Pulse: 92 bpm, Respiratory Rate: 20 breaths/min, Blood Pressure: 136/91 mmHg, Capillary Blood Glucose: 131 mg/dl. General Notes: Wound exam oo At the base of the left fifth metatarsal a clean healthy looking wound continuing to get better. Minimal depth no evidence of infection oo The right fifth metatarsal wound has deteriorated necrotic bone in the center of this. Pieces of bone falling out of this. This is not going to last and is not going to be salvageable. He does not look toxic there is no surrounding infection Integumentary (Hair, Skin) Wound #3 status is Open. Original cause of wound was Trauma. The date acquired was: 10/02/2019. The wound has been in treatment 50 weeks. The wound is located on the Left,Lateral Foot. The wound measures 2.3cm length x 2.5cm width x 0.2cm depth; 4.516cm^2 area and 0.903cm^3 volume. There is Fat Layer (Subcutaneous Tissue) exposed. There is no tunneling or undermining noted. There is a large amount of serosanguineous drainage noted. The wound margin is thickened. There is large (67-100%) pink granulation within the wound bed. There is no necrotic tissue within the wound bed. Wound #5 status is Open. Original cause of wound was Gradually Appeared. The date acquired was: 07/07/2020. The wound has been in treatment 13 weeks. The wound is located on the Right,Lateral Metatarsal head fifth. The wound measures 1.5cm length x 1.9cm width x 1.5cm depth; 2.238cm^2 area and 3.358cm^3 volume. There is bone and Fat  Layer (Subcutaneous Tissue) exposed. There is no tunneling noted, however, there is undermining starting at 8:00 and ending at 5:00 with a maximum distance of 1.3cm. There is a medium amount of serosanguineous drainage noted. The wound margin is distinct with the outline attached to the wound base. There is medium (34-66%) pink granulation within the wound bed. There is a medium (34-66%) amount of necrotic tissue within the wound bed including Adherent Slough. General Notes: macerated periwound. Wound #7 status is Open. Original cause of wound was Gradually Appeared. The date acquired was: 08/17/2020. The wound has been in treatment 8 weeks. The wound is located on the Right,Anterior Ankle. The wound measures 1.5cm length x 0.8cm width x 0.1cm depth; 0.942cm^2 area and 0.094cm^3 volume. There is Fat Layer (Subcutaneous Tissue) exposed. There is no tunneling or undermining noted. There is a medium amount of serosanguineous drainage noted. The wound margin is distinct with the outline attached to the wound base. There is large (67-100%) red granulation within the wound bed. There is no necrotic tissue within the wound bed. 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 There was a T Contact Cast otal Procedure by [name], MD. Post procedure Diagnosis Wound #3: Same as Pre-Procedure Plan Follow-up Appointments: Return Appointment in 1 week. - ***60 minutes for cast*** - with Dr. Dellia Nims Other: - We are referring you to Dr. Doran Durand for a positive PCR bone culture of your right lateral  foot. They will call you to schedule an appt. Bathing/ Shower/  Hygiene: May shower with protection but do not get wound dressing(s) wet. - use a cast protector to left leg. Edema Control - Lymphedema / SCD / Other: Elevate legs to the level of the heart or above for 30 minutes daily and/or when sitting, a frequency of: - throughout the day Avoid standing for long periods of time. Exercise regularly Moisturize legs daily. - right leg every night before bed. Compression stocking or Garment 20-30 mm/Hg pressure to: - Apply to right leg in the morning and remove at night. Off-Loading: T Contact Cast to Left Lower Extremity otal Open toe surgical shoe to: - right foot ordered were: Referral - Urgent referral to Dr. Doran Durand for a possible right foot ray amputation r/t positive PCR bone culture of the right lateral foot The following medication(s) was prescribed: Augmentin oral 875 mg-125 mg tablet 1 tablet oral bid for 2 weeks (continuing rx) for wound infection starting 10/13/2020 Cipro oral 500 mg tablet 1 tablet oral bid for 2 weeks (continuing rx) for wound infection starting 10/13/2020 WOUND #3: - Foot Wound Laterality: Left, Lateral Cleanser: Soap and Water 1 x Per Week/30 Days Discharge Instructions: May shower and wash wound with dial antibacterial soap and water prior to dressing change. Peri-Wound Care: Zinc Oxide Ointment 30g tube 1 x Per Week/30 Days Discharge Instructions: Apply Zinc Oxide to periwound with each dressing change as needed. Prim Dressing: Hydrofera Blue Classic Foam, 2x2 in 1 x Per Week/30 Days ary Discharge Instructions: Moisten with saline prior to applying to wound bed Secondary Dressing: Woven Gauze Sponge, Non-Sterile 4x4 in 1 x Per Week/30 Days Discharge Instructions: Apply over primary dressing as directed. Secondary Dressing: ABD Pad, 8x10 1 x Per Week/30 Days Discharge Instructions: T down before applying kerlix. Apply over primary dressing as  directed. ape Secondary Dressing: Zetuvit Plus 4x8 in 1 x Per Week/30 Days Discharge Instructions: Apply over primary dressing as directed. Secured With: [employer], 4.5x3.1 (in/yd) 1 x Per Week/30 Days Discharge Instructions: Secure with Kerlix as directed. Com pression Wrap: Kerlix Roll 4.5x3.1 (in/yd) 1 x Per Week/30 Days Discharge Instructions: Apply Kerlix and Coban compression under TCC. Com pression Wrap: Coban Self-Adherent Wrap 4x5 (in/yd) 1 x Per Week/30 Days Discharge Instructions: Apply over Kerlix as directed. WOUND #5: - Metatarsal head fifth Wound Laterality: Right, Lateral Cleanser: Normal Saline (DME) (Generic) Every Other Day/15 Days Discharge Instructions: Cleanse the wound with Normal Saline prior to applying a clean dressing using gauze sponges, not tissue or cotton balls. Cleanser: Soap and Water Every Other Day/15 Days Discharge Instructions: May shower and wash wound with dial antibacterial soap and water prior to dressing change. Prim Dressing: KerraCel Ag Gelling Fiber Dressing, 2x2 in (silver alginate) (DME) (Generic) Every Other Day/15 Days ary Discharge Instructions: Apply silver alginate to wound bed as instructed Secondary Dressing: ABD Pad, 5x9 (DME) (Generic) Every Other Day/15 Days Discharge Instructions: Apply over primary dressing as directed. Secondary Dressing: Optifoam Non-Adhesive Dressing, 4x4 in (DME) (Generic) Every Other Day/15 Days Discharge Instructions: Apply over primary dressing, cut to make foam donut to offload Secured With: Kerlix Roll Sterile, 4.5x3.1 (in/yd) (DME) (Generic) Every Other Day/15 Days Discharge Instructions: Secure with Kerlix as directed. Secured With: 33M Medipore H Soft Cloth Surgical T ape, 2x2 (in/yd) (DME) (Generic) Every Other Day/15 Days Discharge Instructions: Secure dressing with tape as directed. WOUND #7: - Ankle Wound Laterality: Right, Anterior Cleanser: Normal Saline (DME) (Generic) Every Other Day/15  Days Discharge Instructions: Cleanse the wound with Normal Saline prior to  applying a clean dressing using gauze sponges, not tissue or cotton balls. Cleanser: Soap and Water Every Other Day/15 Days Discharge Instructions: May shower and wash wound with dial antibacterial soap and water prior to dressing change. Prim Dressing: KerraCel Ag Gelling Fiber Dressing, 2x2 in (silver alginate) (DME) (Generic) Every Other Day/15 Days ary Discharge Instructions: Apply silver alginate to wound bed as instructed Secondary Dressing: Woven Gauze Sponge, Non-Sterile 4x4 in (DME) (Generic) Every Other Day/15 Days Discharge Instructions: Apply over primary dressing as directed. Secondary Dressing: ABD Pad, 5x9 (DME) (Generic) Every Other Day/15 Days Discharge Instructions: Apply over primary dressing as directed. Secured With: [employer], 4.5x3.1 (in/yd) (DME) (Generic) Every Other Day/15 Days Discharge Instructions: Secure with Kerlix as directed. Secured With: 6M Medipore H Soft Cloth Surgical T ape, 2x2 (in/yd) (DME) (Generic) Every Other Day/15 Days Discharge Instructions: Secure dressing with tape as directed. 1. He is going to need a right fifth ray amputation or at least assessment for this. I am going to refer him to Dr. Doran Durand 2. I am going to renew his antibiotics until we can get arrangements for surgery Augmentin and ciprofloxacin 3. Using silver algiante on the right including the right ankle 4. The left continues to improve her using Hydrofera Blue under a total contact cast I would really like to try and get this wound to heal 5. I told the patient that the right fifth metatarsal head wound is worrisome. If he develops fevers chills spreading redness or pain he is going to need to go to the emergency room Electronic Signature(s) Signed: 10/13/2020 5:13:18 PM By: [name] MD Entered By: [name] on 10/13/2020  17:13:18 -------------------------------------------------------------------------------- Total Contact Cast Details Patient Name: Date of Service: [name], [name] 10/13/2020 3:00 PM Medical Record Number: [id number] Patient Account Number: [id number] Date of Birth/Sex: Treating RN: 02-26-1986 (35 y.o. [name] Primary Care Provider: Seward [name] Other Clinician: Referring Provider: Treating Provider/Extender: [name] in Treatment: 45 T Contact Cast Applied for Wound Assessment: otal Wound #3 Left,Lateral Foot Performed By: Physician [name], MD Post Procedure Diagnosis Same as Pre-procedure Electronic Signature(s) Signed: 10/13/2020 5:25:56 PM By: [name] MD Entered By: [name] on 10/13/2020 16:57:18 -------------------------------------------------------------------------------- SuperBill Details Patient Name: Date of Service: [name], [name] 10/13/2020 Medical Record Number: [id number] Patient Account Number: [id number] Date of Birth/Sex: Treating RN: 02/17/1986 (35 y.o. 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 Facility Procedures CPT4 Code: [id number] Description: [id number] - APPLY TOTAL CONTACT LEG CAST ICD-10 Diagnosis Description L97.528 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

## 2020-10-14 NOTE — Progress Notes (Signed)
[name] ([id number]) Visit Report for 10/13/2020 Arrival Information Details Patient Name: Date of Service: [name] 10/13/2020 3:00 PM Medical Record Number: [id number] Patient Account Number: [id number] Date of Birth/Sex: Treating RN: 01-26-1986 (35 y.o. [name] Primary Care Jacquel Redditt: [name] Other Clinician: Referring Zacary Bauer: Treating Norton Bivins/Extender: [name] in Treatment: 47 Visit Information History Since Last Visit Added or deleted any medications: No Patient Arrived: Ambulatory Any new allergies or adverse reactions: No Arrival Time: 15:45 Had a fall or experienced change in No Accompanied By: self activities of daily living that may affect Transfer Assistance: None risk of falls: Patient Identification Verified: Yes Signs or symptoms of abuse/neglect since No Secondary Verification Process Completed: Yes last visito Patient Requires Transmission-Based Precautions: No Hospitalized since last visit: No Patient Has Alerts: No Implantable device outside of the clinic No excluding cellular tissue based products placed in the center since last visit: Has Dressing in Place as Prescribed: Yes Has Compression in Place as Prescribed: Yes Has Footwear/Offloading in Place as Yes Prescribed: Left: T Contact Cast otal Right: Surgical Shoe with Pressure Relief Insole Pain Present Now: No Electronic Signature(s) Signed: 10/13/2020 6:16:29 PM By: Deon Pilling Entered By: Deon Pilling on 10/13/2020 16:07:36 -------------------------------------------------------------------------------- Encounter Discharge Information Details Patient Name: Date of Service: [name] [name] 10/13/2020 3:00 PM Medical Record Number: [id number] Patient Account Number: [id number] Date of Birth/Sex: Treating RN: 10/20/1985 (35 y.o. [name] Primary Care Cas Tracz: [name] Other Clinician: Referring Maxximus Gotay: Treating  Annalaya Wile/Extender: [name] in Treatment: 41 Encounter Discharge Information Items Discharge Condition: Stable Ambulatory Status: Ambulatory Discharge Destination: Home Transportation: Private Auto Schedule Follow-up Appointment: Yes Clinical Summary of Care: Provided on 10/13/2020 Form Type Recipient Paper Patient Patient Electronic Signature(s) Signed: 10/13/2020 4:36:34 PM By: [name] Entered By: [name] on 10/13/2020 16:36:34 -------------------------------------------------------------------------------- Lower Extremity Assessment Details Patient Name: Date of Service: [name] [name] 10/13/2020 3:00 PM Medical Record Number: [id number] Patient Account Number: [id number] Date of Birth/Sex: Treating RN: 01-Oct-1985 (35 y.o. [name] Primary Care Nyaire Denbleyker: [name] Other Clinician: Referring Leydy Worthey: Treating Selma Mink/Extender: [name] in Treatment: 50 Edema Assessment Assessed: [name]: Yes] [name]: Yes] Edema: [Left: Yes] [Right: Yes] Calf Left: Right: Point of Measurement: 48 cm From Medial Instep 50 cm 48 cm Ankle Left: Right: Point of Measurement: 11 cm From Medial Instep 28 cm 29 cm Vascular Assessment Pulses: Dorsalis Pedis Palpable: [Left:Yes] [Right:Yes] Electronic Signature(s) Signed: 10/13/2020 6:16:29 PM By: Deon Pilling Entered By: Deon Pilling on 10/13/2020 16:09:10 -------------------------------------------------------------------------------- Multi Wound Chart Details Patient Name: Date of Service: [name] [name] 10/13/2020 3:00 PM Medical Record Number: [id number] Patient Account Number: [id number] Date of Birth/Sex: Treating RN: 1985-08-18 (35 y.o. [name], [name] Primary Care Tacari Repass: [name] Other Clinician: Referring [name]: Treating Sara Selvidge/Extender: [name] in Treatment: 50 Vital Signs Height(in):  77 Capillary Blood Glucose(mg/dl): 131 Weight(lbs): 280 Pulse(bpm): 92 Body Mass Index(BMI): 33 Blood Pressure(mmHg): 136/91 Temperature(F): 98.3 Respiratory Rate(breaths/min): 20 Photos: [3:No Photos Left, Lateral Foot] [5:No Photos Right, Lateral Metatarsal head fifth] [7:No Photos Right, Anterior Ankle] Wound Location: [3:Trauma] [5:Gradually Appeared] [7:Gradually Appeared] Wounding Event: [3:Diabetic Wound/Ulcer of the Lower] [5:Diabetic Wound/Ulcer of the Lower] [7:Diabetic Wound/Ulcer of the Lower] Primary Etiology: [3:Extremity Type II Diabetes] [5:Extremity Type II Diabetes] [7:Extremity Type II Diabetes] Comorbid History: [3:10/02/2019] [5:07/07/2020] [7:08/17/2020] Date Acquired: [3:50] [5:13] [7:8] Weeks of Treatment: [3:Open] [5:Open] [7:Open] Wound Status: [3:2.3x2.5x0.2] [5:1.5x1.9x1.5] [7:1.5x0.8x0.1]  Measurements L x W x [name] [3:4.516] [5:2.238] [7:0.942] A (cm) : rea [3:0.903] [5:3.358] [7:0.094] Volume (cm) : [3:-173.90%] [5:74.60%] [7:58.50%] % Reduction in A rea: [3:-447.30%] [5:23.60%] [7:58.60%] % Reduction in Volume: [5:8] Starting Position 1 (o'clock): [5:5] Ending Position 1 (o'clock): [5:1.3] Maximum Distance 1 (cm): [3:No] [5:Yes] [7:No] Undermining: [3:Grade 2] [5:Grade 2] [7:Grade 2] Classification: [3:Large] [5:Medium] [7:Medium] Exudate A mount: [3:Serosanguineous] [5:Serosanguineous] [7:Serosanguineous] Exudate Type: [3:red, brown] [5:red, brown] [7:red, brown] Exudate Color: [3:Thickened] [5:Distinct, outline attached] [7:Distinct, outline attached] Wound Margin: [3:Large (67-100%)] [5:Medium (34-66%)] [7:Large (67-100%)] Granulation A mount: [3:Pink] [5:Pink] [7:Red] Granulation Quality: [3:None Present (0%)] [5:Medium (34-66%)] [7:None Present (0%)] Necrotic A mount: [3:Fat Layer (Subcutaneous Tissue): Yes Fat Layer (Subcutaneous Tissue): Yes Fat Layer (Subcutaneous Tissue): Yes] Exposed Structures: [3:Fascia: No Tendon: No Muscle: No Joint:  No Bone: No Small (1-33%)] [5:Bone: Yes Fascia: No Tendon: No Muscle: No Joint: No Small (1-33%)] [7:Fascia: No Tendon: No Muscle: No Joint: No Bone: No Medium (34-66%)] Epithelialization: [3:N/A] [5:macerated periwound.] [7:N/A] Assessment Notes: [3:T Contact Cast otal] [5:N/A] [7:N/A] Treatment Notes Electronic Signature(s) Signed: 10/13/2020 5:25:56 PM By: [name] MD Signed: 10/14/2020 6:12:45 PM By: Rhae Hammock RN Entered By: [name] on 10/13/2020 16:57:04 -------------------------------------------------------------------------------- Multi-Disciplinary Care Plan Details Patient Name: Date of Service: [name], [name] [name] 10/13/2020 3:00 PM Medical Record Number: [id number] Patient Account Number: [id number] Date of Birth/Sex: Treating RN: 1986/01/26 (35 y.o. [name], [name] Primary Care Almer Littleton: [name] Other Clinician: Referring Sanyia Dini: Treating Marea Reasner/Extender: [name] in Treatment: 46 Multidisciplinary Care Plan reviewed with physician Active Inactive Nutrition Nursing Diagnoses: Imbalanced nutrition Potential for alteratiion in Nutrition/Potential for imbalanced nutrition Goals: Patient/caregiver agrees to and verbalizes understanding of need to use nutritional supplements and/or vitamins as prescribed Date Initiated: 10/24/2019 Date Inactivated: 04/06/2020 Target Resolution Date: 04/03/2020 Goal Status: Met Patient/caregiver will maintain therapeutic glucose control Date Initiated: 10/24/2019 Target Resolution Date: 10/16/2020 Goal Status: Active Interventions: Assess HgA1c results as ordered upon admission and as needed Assess patient nutrition upon admission and as needed per policy Provide education on elevated blood sugars and impact on wound healing Provide education on nutrition Treatment Activities: Education provided on Nutrition : 09/29/2020 Notes: Wound/Skin Impairment Nursing  Diagnoses: Impaired tissue integrity Knowledge deficit related to ulceration/compromised skin integrity Goals: Patient/caregiver will verbalize understanding of skin care regimen Date Initiated: 10/24/2019 Target Resolution Date: 10/16/2020 Goal Status: Active Ulcer/skin breakdown will have a volume reduction of 30% by week 4 Date Initiated: 10/24/2019 Date Inactivated: 01/16/2020 Target Resolution Date: 01/10/2020 Unmet Reason: no change in Goal Status: Unmet measurements. Interventions: Assess patient/caregiver ability to obtain necessary supplies Assess patient/caregiver ability to perform ulcer/skin care regimen upon admission and as needed Assess ulceration(s) every visit Provide education on ulcer and skin care Notes: Electronic Signature(s) Signed: 10/14/2020 6:12:45 PM By: Rhae Hammock RN Entered By: Rhae Hammock on 10/13/2020 16:38:56 -------------------------------------------------------------------------------- Pain Assessment Details Patient Name: Date of Service: [name] [name] 10/13/2020 3:00 PM Medical Record Number: [id number] Patient Account Number: [id number] Date of Birth/Sex: Treating RN: [birth year] (35 y.o. [name] Primary Care Tarea Skillman: [name] Other Clinician: Referring Donnavan Covault: Treating Halah Whiteside/Extender: [name] in Treatment: 50 Active Problems Location of Pain Severity and Description of Pain Patient Has Paino No Site Locations Rate the pain. Current Pain Level: 0 Pain Management and Medication Current Pain Management: Medication: No Cold Application: No Rest: No Massage: No Activity: No T.E.N.S.: No Heat Application: No Leg drop or elevation: No Is the Current  Pain Management Adequate: Adequate How does your wound impact your activities of daily livingo Sleep: No Bathing: No Appetite: No Relationship With Others: No Bladder Continence: No Emotions: No Bowel Continence:  No Work: No Toileting: No Drive: No Dressing: No Hobbies: No Electronic Signature(s) Signed: 10/13/2020 6:16:29 PM By: Deon Pilling Entered By: Deon Pilling on 10/13/2020 16:08:51 -------------------------------------------------------------------------------- Patient/Caregiver Education Details Patient Name: Date of Service: [name] [name] 7/12/2022andnbsp3:00 PM Medical Record Number: [id number] Patient Account Number: [id number] Date of Birth/Gender: Treating RN: 05-Apr-1985 (35 y.o. [name] Primary Care Physician: [name] Other Clinician: Referring Physician: Treating Physician/Extender: [name] in Treatment: 42 Education Assessment Education Provided To: Patient Education Topics Provided Elevated Blood Sugar/ Impact on Healing: Methods: Explain/Verbal Responses: State content correctly Nutrition: Methods: Explain/Verbal Responses: State content correctly Wound/Skin Impairment: Methods: Explain/Verbal Responses: State content correctly Electronic Signature(s) Signed: 10/14/2020 6:12:45 PM By: Rhae Hammock RN Entered By: Rhae Hammock on 10/13/2020 16:46:29 -------------------------------------------------------------------------------- Wound Assessment Details Patient Name: Date of Service: [name] [name] 10/13/2020 3:00 PM Medical Record Number: [id number] Patient Account Number: [id number] Date of Birth/Sex: Treating RN: 1985/05/08 (35 y.o. [name] Primary Care Provider: [name] Other Clinician: Referring Provider: Treating Provider/Extender: [name] in Treatment: 51 Wound Status Wound Number: 3 Primary Etiology: Diabetic Wound/Ulcer of the Lower Extremity Wound Location: Left, Lateral Foot Wound Status: Open Wounding Event: Trauma Comorbid History: Type II Diabetes Date Acquired: 10/02/2019 Weeks Of Treatment: 50 Clustered Wound:  No Photos Photo Uploaded By: Sandre Kitty on 10/13/2020 17:08:14 Wound Measurements Length: (cm) 2.3 Width: (cm) 2.5 Depth: (cm) 0.2 Area: (cm) 4.516 Volume: (cm) 0.903 % Reduction in Area: -173.9% % Reduction in Volume: -447.3% Epithelialization: Small (1-33%) Tunneling: No Undermining: No Wound Description Classification: Grade 2 Wound Margin: Thickened Exudate Amount: Large Exudate Type: Serosanguineous Exudate Color: red, brown Foul Odor After Cleansing: No Slough/Fibrino No Wound Bed Granulation Amount: Large (67-100%) Exposed Structure Granulation Quality: Pink Fascia Exposed: No Necrotic Amount: None Present (0%) Fat Layer (Subcutaneous Tissue) Exposed: Yes Tendon Exposed: No Muscle Exposed: No Joint Exposed: No Bone Exposed: No Treatment Notes Wound #3 (Foot) Wound Laterality: Left, Lateral Cleanser Soap and Water Discharge Instruction: May shower and wash wound with dial antibacterial soap and water prior to dressing change. Peri-Wound Care Zinc Oxide Ointment 30g tube Discharge Instruction: Apply Zinc Oxide to periwound with each dressing change as needed. Topical Primary Dressing Hydrofera Blue Classic Foam, 2x2 in Discharge Instruction: Moisten with saline prior to applying to wound bed Secondary Dressing Woven Gauze Sponge, Non-Sterile 4x4 in Discharge Instruction: Apply over primary dressing as directed. ABD Pad, 8x10 Discharge Instruction: T down before applying kerlix. Apply over primary dressing as directed. ape Zetuvit Plus 4x8 in Discharge Instruction: Apply over primary dressing as directed. Secured With [employer], 4.5x3.1 (in/yd) Discharge Instruction: Secure with Kerlix as directed. Compression Wrap Kerlix Roll 4.5x3.1 (in/yd) Discharge Instruction: Apply Kerlix and Coban compression under TCC. Coban Self-Adherent Wrap 4x5 (in/yd) Discharge Instruction: Apply over Kerlix as directed. Compression  Stockings Add-Ons Notes TCC last layer applied by MD to left leg. Electronic Signature(s) Signed: 10/13/2020 6:16:29 PM By: Deon Pilling Entered By: Deon Pilling on 10/13/2020 16:09:34 -------------------------------------------------------------------------------- Wound Assessment Details Patient Name: Date of Service: [name] 10/13/2020 3:00 PM Medical Record Number: [id number] Patient Account Number: [id number] Date of Birth/Sex: Treating RN: 05/20/1985 (35 y.o. [name] Primary Care Provider: [name] Other Clinician: Referring Provider: Treating Provider/Extender: Dellia Nims  [name], Lowell Bouton in Treatment: 50 Wound Status Wound Number: 5 Primary Etiology: Diabetic Wound/Ulcer of the Lower Extremity Wound Location: Right, Lateral Metatarsal head fifth Wound Status: Open Wounding Event: Gradually Appeared Comorbid History: Type II Diabetes Date Acquired: 07/07/2020 Weeks Of Treatment: 13 Clustered Wound: No Photos Photo Uploaded By: Sandre Kitty on 10/13/2020 17:08:01 Wound Measurements Length: (cm) 1.5 Width: (cm) 1.9 Depth: (cm) 1.5 Area: (cm) 2.238 Volume: (cm) 3.358 Wound Description Classification: Grade 2 Wound Margin: Distinct, outline attached Exudate Amount: Medium Exudate Type: Serosanguineous Exudate Color: red, brown Foul Odor After Cleansing: Slough/Fibrino % Reduction in Area: 74.6% % Reduction in Volume: 23.6% Epithelialization: Small (1-33%) Tunneling: No Undermining: Yes Starting Position (o'clock): 8 Ending Position (o'clock): 5 Maximum Distance: (cm) 1.3 No Yes Wound Bed Granulation Amount: Medium (34-66%) Exposed Structure Granulation Quality: Pink Fascia Exposed: No Necrotic Amount: Medium (34-66%) Fat Layer (Subcutaneous Tissue) Exposed: Yes Necrotic Quality: Adherent Slough Tendon Exposed: No Muscle Exposed: No Joint Exposed: No Bone Exposed: Yes Assessment Notes macerated  periwound. Treatment Notes Wound #5 (Metatarsal head fifth) Wound Laterality: Right, Lateral Cleanser Normal Saline Discharge Instruction: Cleanse the wound with Normal Saline prior to applying a clean dressing using gauze sponges, not tissue or cotton balls. Soap and Water Discharge Instruction: May shower and wash wound with dial antibacterial soap and water prior to dressing change. Peri-Wound Care Topical Primary Dressing KerraCel Ag Gelling Fiber Dressing, 2x2 in (silver alginate) Discharge Instruction: Apply silver alginate to wound bed as instructed Secondary Dressing ABD Pad, 5x9 Discharge Instruction: Apply over primary dressing as directed. Optifoam Non-Adhesive Dressing, 4x4 in Discharge Instruction: Apply over primary dressing, cut to make foam donut to offload Secured With [employer] Sterile, 4.5x3.1 (in/yd) Discharge Instruction: Secure with Kerlix as directed. 21M Medipore H Soft Cloth Surgical T ape, 2x2 (in/yd) Discharge Instruction: Secure dressing with tape as directed. Compression Wrap Compression Stockings Add-Ons Notes TCC last layer applied by MD to left leg. Electronic Signature(s) Signed: 10/13/2020 6:16:29 PM By: Deon Pilling Entered By: Deon Pilling on 10/13/2020 16:10:39 -------------------------------------------------------------------------------- Wound Assessment Details Patient Name: Date of Service: [name] 10/13/2020 3:00 PM Medical Record Number: [id number] Patient Account Number: [id number] Date of Birth/Sex: Treating RN: 05-18-1985 (35 y.o. [name], Meta.Reding Primary Care Provider: Other Clinician: [name] Referring Provider: Treating Provider/Extender: [name] in Treatment: 16 Wound Status Wound Number: 7 Primary Etiology: Diabetic Wound/Ulcer of the Lower Extremity Wound Location: Right, Anterior Ankle Wound Status: Open Wounding Event: Gradually Appeared Comorbid History: Type II  Diabetes Date Acquired: 08/17/2020 Weeks Of Treatment: 8 Clustered Wound: No Photos Photo Uploaded By: Sandre Kitty on 10/13/2020 17:08:01 Wound Measurements Length: (cm) 1.5 Width: (cm) 0.8 Depth: (cm) 0.1 Area: (cm) 0.942 Volume: (cm) 0.094 % Reduction in Area: 58.5% % Reduction in Volume: 58.6% Epithelialization: Medium (34-66%) Tunneling: No Undermining: No Wound Description Classification: Grade 2 Wound Margin: Distinct, outline attached Exudate Amount: Medium Exudate Type: Serosanguineous Exudate Color: red, brown Foul Odor After Cleansing: No Slough/Fibrino No Wound Bed Granulation Amount: Large (67-100%) Exposed Structure Granulation Quality: Red Fascia Exposed: No Necrotic Amount: None Present (0%) Fat Layer (Subcutaneous Tissue) Exposed: Yes Tendon Exposed: No Muscle Exposed: No Joint Exposed: No Bone Exposed: No Treatment Notes Wound #7 (Ankle) Wound Laterality: Right, Anterior Cleanser Normal Saline Discharge Instruction: Cleanse the wound with Normal Saline prior to applying a clean dressing using gauze sponges, not tissue or cotton balls. Soap and Water Discharge Instruction: May shower and wash wound with dial antibacterial  soap and water prior to dressing change. Peri-Wound Care Topical Primary Dressing KerraCel Ag Gelling Fiber Dressing, 2x2 in (silver alginate) Discharge Instruction: Apply silver alginate to wound bed as instructed Secondary Dressing Woven Gauze Sponge, Non-Sterile 4x4 in Discharge Instruction: Apply over primary dressing as directed. ABD Pad, 5x9 Discharge Instruction: Apply over primary dressing as directed. Secured With [employer], 4.5x3.1 (in/yd) Discharge Instruction: Secure with Kerlix as directed. 5M Medipore H Soft Cloth Surgical T ape, 2x2 (in/yd) Discharge Instruction: Secure dressing with tape as directed. Compression Wrap Compression Stockings Add-Ons Notes TCC last layer applied by MD to left  leg. Electronic Signature(s) Signed: 10/13/2020 6:16:29 PM By: Deon Pilling Entered By: Deon Pilling on 10/13/2020 16:09:59 -------------------------------------------------------------------------------- Vitals Details Patient Name: Date of Service: [name] [name] 10/13/2020 3:00 PM Medical Record Number: [id number] Patient Account Number: [id number] Date of Birth/Sex: Treating RN: Aug 21, 1985 (35 y.o. [name] Primary Care Provider: [name] Other Clinician: Referring Provider: Treating Provider/Extender: [name] in Treatment: 50 Vital Signs Time Taken: 15:45 Temperature (F): 98.3 Height (in): 77 Pulse (bpm): 92 Weight (lbs): 280 Respiratory Rate (breaths/min): 20 Body Mass Index (BMI): 33.2 Blood Pressure (mmHg): 136/91 Capillary Blood Glucose (mg/dl): 131 Reference Range: 80 - 120 mg / dl Electronic Signature(s) Signed: 10/13/2020 6:16:29 PM By: Deon Pilling Entered By: Deon Pilling on 10/13/2020 16:08:41

## 2020-10-15 ENCOUNTER — Other Ambulatory Visit (HOSPITAL_COMMUNITY): Payer: Self-pay

## 2020-10-15 ENCOUNTER — Ambulatory Visit: Payer: BC Managed Care – PPO | Admitting: Internal Medicine

## 2020-10-16 ENCOUNTER — Encounter (HOSPITAL_BASED_OUTPATIENT_CLINIC_OR_DEPARTMENT_OTHER): Payer: BC Managed Care – PPO | Admitting: Internal Medicine

## 2020-10-19 ENCOUNTER — Other Ambulatory Visit (HOSPITAL_COMMUNITY): Payer: Self-pay | Admitting: Orthopedic Surgery

## 2020-10-20 ENCOUNTER — Encounter (HOSPITAL_BASED_OUTPATIENT_CLINIC_OR_DEPARTMENT_OTHER): Payer: Self-pay | Admitting: Orthopedic Surgery

## 2020-10-20 ENCOUNTER — Other Ambulatory Visit: Payer: Self-pay

## 2020-10-20 ENCOUNTER — Encounter (HOSPITAL_BASED_OUTPATIENT_CLINIC_OR_DEPARTMENT_OTHER): Payer: BC Managed Care – PPO | Admitting: Internal Medicine

## 2020-10-20 NOTE — Progress Notes (Signed)
Pietrzyk, [name] ([id number]) Visit Report for 09/29/2020 Arrival Information Details Patient Name: Date of Service: [name] 09/29/2020 3:00 PM Medical Record Number: [id number] Patient Account Number: [id number] Date of Birth/Sex: Treating RN: [birth year] (35 y.o. [name], [name] Primary Care Saskia Simerson: [name] Other Clinician: Referring Giankarlo Leamer: Treating Mertie Haslem/Extender: [name] in Treatment: 21 Visit Information History Since Last Visit Added or deleted any medications: No Patient Arrived: Ambulatory Any new allergies or adverse reactions: No Arrival Time: 16:12 Had a fall or experienced change in No Accompanied By: self activities of daily living that may affect Transfer Assistance: None risk of falls: Patient Identification Verified: Yes Signs or symptoms of abuse/neglect since last visito No Secondary Verification Process Completed: Yes Hospitalized since last visit: No Patient Requires Transmission-Based Precautions: No Implantable device outside of the clinic excluding No Patient Has Alerts: No cellular tissue based products placed in the center since last visit: Has Dressing in Place as Prescribed: Yes Pain Present Now: No Electronic Signature(s) Signed: 09/29/2020 4:39:21 PM By: Sandre Kitty Entered By: Sandre Kitty on 09/29/2020 16:12:34 -------------------------------------------------------------------------------- Encounter Discharge Information Details Patient Name: Date of Service: [name], [name] 09/29/2020 3:00 PM Medical Record Number: [id number] Patient Account Number: [id number] Date of Birth/Sex: Treating RN: June 27, 1985 (35 y.o. [name] Primary Care Keshonda Monsour: [name] Other Clinician: Referring Trish Mancinelli: Treating Demico Ploch/Extender: [name] in Treatment: 48 Encounter Discharge Information Items Post Procedure Vitals Discharge Condition:  Stable Temperature (F): 99 Ambulatory Status: Ambulatory Pulse (bpm): 104 Discharge Destination: Home Respiratory Rate (breaths/min): 18 Transportation: Private Auto Blood Pressure (mmHg): 159/92 Schedule Follow-up Appointment: Yes Clinical Summary of Care: Provided on 09/29/2020 Form Type Recipient Paper Patient Patient Electronic Signature(s) Signed: 09/29/2020 5:22:36 PM By: [name] Entered By: [name] on 09/29/2020 17:22:36 -------------------------------------------------------------------------------- Lower Extremity Assessment Details Patient Name: Date of Service: [name], [name] 09/29/2020 3:00 PM Medical Record Number: [id number] Patient Account Number: [id number] Date of Birth/Sex: Treating RN: [birth year] (35 y.o. [name] Primary Care Eliab Closson: [name] Other Clinician: Referring Kaisey Huseby: Treating Debria Broecker/Extender: [name] in Treatment: 48 Edema Assessment Assessed: [name]: No] [name]: No] Edema: [Left: Yes] [Right: Yes] Calf Left: Right: Point of Measurement: 48 cm From Medial Instep 46.5 cm 48.5 cm Ankle Left: Right: Point of Measurement: 11 cm From Medial Instep 33 cm 33.4 cm Vascular Assessment Pulses: Dorsalis Pedis Palpable: [Left:Yes] [Right:Yes] Electronic Signature(s) Signed: 10/20/2020 8:52:52 AM By: Deon Pilling Previous Signature: 10/20/2020 8:50:44 AM Version By: Deon Pilling Previous Signature: 10/19/2020 8:46:08 PM Version By: Deon Pilling Entered By: Deon Pilling on 10/20/2020 08:52:52 -------------------------------------------------------------------------------- Multi Wound Chart Details Patient Name: Date of Service: [name], [name] 09/29/2020 3:00 PM Medical Record Number: [id number] Patient Account Number: [id number] Date of Birth/Sex: Treating RN: 1985/09/10 (35 y.o. [name], [name] Primary Care Shuntell Foody: [name] Other Clinician: Referring  Cydni Reddoch: Treating Mikahla Wisor/Extender: [name] in Treatment: 48 Vital Signs Height(in): 77 Capillary Blood Glucose(mg/dl): 120 Weight(lbs): 280 Pulse(bpm): 104 Body Mass Index(BMI): 33 Blood Pressure(mmHg): 159/92 Temperature(F): 99.0 Respiratory Rate(breaths/min): 18 Photos: [3:No Photos Left, Lateral Foot] [5:No Photos Right, Lateral Metatarsal head fifth] [7:No Photos Right, Anterior Ankle] Wound Location: [3:Trauma] [5:Gradually Appeared] [7:Gradually Appeared] Wounding Event: [3:Diabetic Wound/Ulcer of the Lower] [5:Diabetic Wound/Ulcer of the Lower] [7:Diabetic Wound/Ulcer of the Lower] Primary Etiology: [3:Extremity Type II Diabetes] [5:Extremity Type II Diabetes] [7:Extremity Type II Diabetes] Comorbid History: [3:10/02/2019] [5:07/07/2020] [7:08/17/2020] Date Acquired: [3:48] [5:11] [7:6] Weeks of  Treatment: [3:Open] [5:Open] [7:Open] Wound Status: [3:2x2x0.5] [5:2.4x2.5x0.8] [7:1x0.6x0.1] Measurements L x W x D (cm) [3:3.142] [5:4.712] [7:0.471] A (cm) : rea [3:1.571] [5:3.77] [7:0.047] Volume (cm) : [3:-90.50%] [5:46.40%] [7:79.30%] % Reduction in A rea: [3:-852.10%] [5:14.30%] [7:79.30%] % Reduction in Volume: [3:Grade 2] [5:Grade 2] [7:Grade 2] Classification: [3:Large] [5:Medium] [7:Medium] Exudate A mount: [3:Serosanguineous] [5:Serosanguineous] [7:Serosanguineous] Exudate Type: [3:red, brown] [5:red, brown] [7:red, brown] Exudate Color: [3:Thickened] [5:Distinct, outline attached] [7:Distinct, outline attached] Wound Margin: [3:Large (67-100%)] [5:Large (67-100%)] [7:Large (67-100%)] Granulation A mount: [3:Red] [5:Red] [7:Red] Granulation Quality: [3:None Present (0%)] [5:None Present (0%)] [7:Small (1-33%)] Necrotic A mount: [3:Fat Layer (Subcutaneous Tissue): Yes Fat Layer (Subcutaneous Tissue): Yes Fat Layer (Subcutaneous Tissue): Yes] Exposed Structures: [3:Fascia: No Tendon: No Muscle: No Joint: No Bone: No Small (1-33%)]  [5:Fascia: No Tendon: No Muscle: No Joint: No Bone: No Small (1-33%)] [7:Fascia: No Tendon: No Muscle: No Joint: No Bone: No Medium (34-66%)] Epithelialization: [3:Debridement - Excisional] [5:Debridement - Excisional] [7:N/A] Debridement: Pre-procedure Verification/Time Out 16:55 [5:16:55] [7:N/A] Taken: [3:Lidocaine] [5:Lidocaine] [7:N/A] Pain Control: [3:Callus, Subcutaneous, Slough] [5:Bone, Callus, Subcutaneous, Slough] [7:N/A] Tissue Debrided: [3:Skin/Subcutaneous Tissue] [5:Skin/Subcutaneous] [7:N/A] Level: [3:4] [5:Tissue/Muscle/Bone 6] [7:N/A] Debridement A (sq cm): [3:rea Curette] [5:Curette] [7:N/A] Instrument: [3:Minimum] [5:Minimum] [7:N/A] Bleeding: [3:Pressure] [5:Pressure] [7:N/A] Hemostasis Achieved: [3:0] [5:0] [7:N/A] Procedural Pain: [3:0] [5:0] [7:N/A] Post Procedural Pain: [3:Procedure was tolerated well] [5:Procedure was tolerated well] [7:N/A] Debridement Treatment Response: [3:2x2x0.5] [5:2.4x2.5x0.8] [7:N/A] Post Debridement Measurements L x W x D (cm) [3:1.571] [5:3.77] [7:N/A] Post Debridement Volume: (cm) [3:Debridement] [5:Debridement] [7:N/A] Procedures Performed: [3:T Contact Cast otal] Treatment Notes Electronic Signature(s) Signed: 09/29/2020 5:29:17 PM By: [name] MD Signed: 09/29/2020 5:48:53 PM By: Rhae Hammock RN Entered By: [name] on 09/29/2020 17:17:36 -------------------------------------------------------------------------------- Multi-Disciplinary Care Plan Details Patient Name: Date of Service: [name], [name] 09/29/2020 3:00 PM Medical Record Number: [id number] Patient Account Number: [id number] Date of Birth/Sex: Treating RN: 27-Jul-1985 (35 y.o. [name], [name] Primary Care Sears Oran: [name] Other Clinician: Referring Edie Vallandingham: Treating Milyn Stapleton/Extender: [name] in Treatment: 49 Multidisciplinary Care Plan reviewed with physician Active Inactive Nutrition Nursing  Diagnoses: Imbalanced nutrition Potential for alteratiion in Nutrition/Potential for imbalanced nutrition Goals: Patient/caregiver agrees to and verbalizes understanding of need to use nutritional supplements and/or vitamins as prescribed Date Initiated: 10/24/2019 Date Inactivated: 04/06/2020 Target Resolution Date: 04/03/2020 Goal Status: Met Patient/caregiver will maintain therapeutic glucose control Date Initiated: 10/24/2019 Target Resolution Date: 10/16/2020 Goal Status: Active Interventions: Assess HgA1c results as ordered upon admission and as needed Assess patient nutrition upon admission and as needed per policy Provide education on elevated blood sugars and impact on wound healing Provide education on nutrition Treatment Activities: Education provided on Nutrition : 07/07/2020 Notes: Wound/Skin Impairment Nursing Diagnoses: Impaired tissue integrity Knowledge deficit related to ulceration/compromised skin integrity Goals: Patient/caregiver will verbalize understanding of skin care regimen Date Initiated: 10/24/2019 Target Resolution Date: 10/16/2020 Goal Status: Active Ulcer/skin breakdown will have a volume reduction of 30% by week 4 Date Initiated: 10/24/2019 Date Inactivated: 01/16/2020 Target Resolution Date: 01/10/2020 Unmet Reason: no change in Goal Status: Unmet measurements. Interventions: Assess patient/caregiver ability to obtain necessary supplies Assess patient/caregiver ability to perform ulcer/skin care regimen upon admission and as needed Assess ulceration(s) every visit Provide education on ulcer and skin care Notes: Electronic Signature(s) Signed: 09/29/2020 5:48:53 PM By: Rhae Hammock RN Entered By: Rhae Hammock on 09/29/2020 16:53:41 -------------------------------------------------------------------------------- Pain Assessment Details Patient Name: Date of Service: [name], [name] 09/29/2020 3:00 PM Medical Record Number:  [id number] Patient  Account Number: [id number] Date of Birth/Sex: Treating RN: 1985-08-30 (35 y.o. [name], [name] Primary Care Breniya Goertzen: [name] Other Clinician: Referring Shalonda Sachse: Treating Annalysia Willenbring/Extender: [name] in Treatment: 48 Active Problems Location of Pain Severity and Description of Pain Patient Has Paino No Site Locations Pain Management and Medication Current Pain Management: Electronic Signature(s) Signed: 09/29/2020 4:39:21 PM By: Sandre Kitty Signed: 09/29/2020 5:48:53 PM By: Rhae Hammock RN Entered By: Sandre Kitty on 09/29/2020 16:13:18 -------------------------------------------------------------------------------- Patient/Caregiver Education Details Patient Name: Date of Service: [name] 6/28/2022andnbsp3:00 PM Medical Record Number: [id number] Patient Account Number: [id number] Date of Birth/Gender: Treating RN: 08-22-1985 (35 y.o. [name] Primary Care Physician: [name] Other Clinician: Referring Physician: Treating Physician/Extender: [name] in Treatment: 5 Education Assessment Education Provided To: Patient Education Topics Provided Basic Hygiene: Methods: Explain/Verbal Responses: State content correctly Elevated Blood Sugar/ Impact on Healing: Methods: Explain/Verbal Responses: State content correctly Nutrition: Methods: Explain/Verbal Responses: State content correctly Wound/Skin Impairment: Methods: Explain/Verbal Responses: State content correctly Electronic Signature(s) Signed: 09/29/2020 5:48:53 PM By: Rhae Hammock RN Entered By: Rhae Hammock on 09/29/2020 16:54:03 -------------------------------------------------------------------------------- Wound Assessment Details Patient Name: Date of Service: [name], [name] 09/29/2020 3:00 PM Medical Record Number: [id number] Patient Account Number: [id number] Date  of Birth/Sex: Treating RN: 06-27-1985 (35 y.o. [name], [name] Primary Care Dandy Lazaro: [name] Other Clinician: Referring Laiba Fuerte: Treating Kathrine Rieves/Extender: [name] in Treatment: 48 Wound Status Wound Number: 3 Primary Etiology: Diabetic Wound/Ulcer of the Lower Extremity Wound Location: Left, Lateral Foot Wound Status: Open Wounding Event: Trauma Comorbid History: Type II Diabetes Date Acquired: 10/02/2019 Weeks Of Treatment: 48 Clustered Wound: No Photos Wound Measurements Length: (cm) 2 Width: (cm) 2 Depth: (cm) 0.5 Area: (cm) 3.142 Volume: (cm) 1.571 % Reduction in Area: -90.5% % Reduction in Volume: -852.1% Epithelialization: Small (1-33%) Tunneling: No Undermining: No Wound Description Classification: Grade 2 Wound Margin: Thickened Exudate Amount: Large Exudate Type: Serosanguineous Exudate Color: red, brown Foul Odor After Cleansing: No Slough/Fibrino No Wound Bed Granulation Amount: Large (67-100%) Exposed Structure Granulation Quality: Red Fascia Exposed: No Necrotic Amount: None Present (0%) Fat Layer (Subcutaneous Tissue) Exposed: Yes Tendon Exposed: No Muscle Exposed: No Joint Exposed: No Bone Exposed: No Electronic Signature(s) Signed: 09/30/2020 10:48:43 AM By: Sandre Kitty Signed: 09/30/2020 6:02:58 PM By: Rhae Hammock RN Previous Signature: 09/29/2020 5:48:53 PM Version By: Rhae Hammock RN Entered By: Sandre Kitty on 09/30/2020 10:33:51 -------------------------------------------------------------------------------- Wound Assessment Details Patient Name: Date of Service: [name], [name] 09/29/2020 3:00 PM Medical Record Number: [id number] Patient Account Number: [id number] Date of Birth/Sex: Treating RN: 1985-09-19 (35 y.o. [name], [name] Primary Care Dezerae Freiberger: [name] Other Clinician: Referring Rydge Texidor: Treating Japhet Morgenthaler/Extender: [name] in Treatment: 49 Wound Status Wound Number: 5 Primary Etiology: Diabetic Wound/Ulcer of the Lower Extremity Wound Location: Right, Lateral Metatarsal head fifth Wound Status: Open Wounding Event: Gradually Appeared Comorbid History: Type II Diabetes Date Acquired: 07/07/2020 Weeks Of Treatment: 11 Clustered Wound: No Photos Wound Measurements Length: (cm) 2.4 Width: (cm) 2.5 Depth: (cm) 0.8 Area: (cm) 4.712 Volume: (cm) 3.77 % Reduction in Area: 46.4% % Reduction in Volume: 14.3% Epithelialization: Small (1-33%) Tunneling: No Undermining: No Wound Description Classification: Grade 2 Wound Margin: Distinct, outline attached Exudate Amount: Medium Exudate Type: Serosanguineous Exudate Color: red, brown Foul Odor After Cleansing: No Slough/Fibrino No Wound Bed Granulation Amount: Large (67-100%) Exposed Structure Granulation Quality: Red Fascia Exposed: No Necrotic Amount: None Present (0%)  Fat Layer (Subcutaneous Tissue) Exposed: Yes Tendon Exposed: No Muscle Exposed: No Joint Exposed: No Bone Exposed: No Electronic Signature(s) Signed: 09/30/2020 10:48:43 AM By: Sandre Kitty Signed: 09/30/2020 6:02:58 PM By: Rhae Hammock RN Previous Signature: 09/29/2020 5:48:53 PM Version By: Rhae Hammock RN Entered By: Sandre Kitty on 09/30/2020 10:34:48 -------------------------------------------------------------------------------- Wound Assessment Details Patient Name: Date of Service: [name], [name] 09/29/2020 3:00 PM Medical Record Number: [id number] Patient Account Number: [id number] Date of Birth/Sex: Treating RN: 1986-02-18 (35 y.o. [name], [name] Primary Care Abrish Erny: [name] Other Clinician: Referring Aurther Harlin: Treating Shawne Bulow/Extender: [name] in Treatment: 65 Wound Status Wound Number: 7 Primary Etiology: Diabetic Wound/Ulcer of the Lower Extremity Wound Location: Right, Anterior  Ankle Wound Status: Open Wounding Event: Gradually Appeared Comorbid History: Type II Diabetes Date Acquired: 08/17/2020 Weeks Of Treatment: 6 Clustered Wound: No Photos Wound Measurements Length: (cm) 1 Width: (cm) 0.6 Depth: (cm) 0.1 Area: (cm) 0.471 Volume: (cm) 0.047 % Reduction in Area: 79.3% % Reduction in Volume: 79.3% Epithelialization: Medium (34-66%) Tunneling: No Undermining: No Wound Description Classification: Grade 2 Wound Margin: Distinct, outline attached Exudate Amount: Medium Exudate Type: Serosanguineous Exudate Color: red, brown Foul Odor After Cleansing: No Slough/Fibrino Yes Wound Bed Granulation Amount: Large (67-100%) Exposed Structure Granulation Quality: Red Fascia Exposed: No Necrotic Amount: Small (1-33%) Fat Layer (Subcutaneous Tissue) Exposed: Yes Necrotic Quality: Adherent Slough Tendon Exposed: No Muscle Exposed: No Joint Exposed: No Bone Exposed: No Electronic Signature(s) Signed: 09/30/2020 10:48:43 AM By: Sandre Kitty Signed: 09/30/2020 6:02:58 PM By: Rhae Hammock RN Previous Signature: 09/29/2020 5:48:53 PM Version By: Rhae Hammock RN Entered By: Sandre Kitty on 09/30/2020 10:34:13 -------------------------------------------------------------------------------- Vitals Details Patient Name: Date of Service: [name], [name] 09/29/2020 3:00 PM Medical Record Number: [id number] Patient Account Number: [id number] Date of Birth/Sex: Treating RN: [birth year] (35 y.o. [name], [name] Primary Care Hayzen Lorenson: [name] Other Clinician: Referring Sharyl Panchal: Treating Afra Tricarico/Extender: [name] in Treatment: 48 Vital Signs Time Taken: 16:12 Temperature (F): 99.0 Height (in): 77 Pulse (bpm): 104 Weight (lbs): 280 Respiratory Rate (breaths/min): 18 Body Mass Index (BMI): 33.2 Blood Pressure (mmHg): 159/92 Capillary Blood Glucose (mg/dl): 120 Reference Range: 80 - 120 mg /  dl Electronic Signature(s) Signed: 09/29/2020 4:39:21 PM By: Sandre Kitty Entered By: Sandre Kitty on 09/29/2020 16:13:02

## 2020-10-21 ENCOUNTER — Encounter (HOSPITAL_BASED_OUTPATIENT_CLINIC_OR_DEPARTMENT_OTHER): Payer: BC Managed Care – PPO | Admitting: Internal Medicine

## 2020-10-21 ENCOUNTER — Encounter (HOSPITAL_BASED_OUTPATIENT_CLINIC_OR_DEPARTMENT_OTHER)
Admission: RE | Admit: 2020-10-21 | Discharge: 2020-10-21 | Disposition: A | Discharge status: Home / Self Care | Payer: BC Managed Care – PPO | Source: Ambulatory Visit | Attending: Orthopedic Surgery | Admitting: Orthopedic Surgery

## 2020-10-21 DIAGNOSIS — M869 Osteomyelitis, unspecified: Secondary | ICD-10-CM | POA: Diagnosis not present

## 2020-10-21 DIAGNOSIS — Z833 Family history of diabetes mellitus: Secondary | ICD-10-CM | POA: Diagnosis not present

## 2020-10-21 DIAGNOSIS — E11621 Type 2 diabetes mellitus with foot ulcer: Secondary | ICD-10-CM | POA: Diagnosis not present

## 2020-10-21 DIAGNOSIS — Z79899 Other long term (current) drug therapy: Secondary | ICD-10-CM | POA: Diagnosis not present

## 2020-10-21 DIAGNOSIS — Z01818 Encounter for other preprocedural examination: Secondary | ICD-10-CM | POA: Insufficient documentation

## 2020-10-21 DIAGNOSIS — E1142 Type 2 diabetes mellitus with diabetic polyneuropathy: Secondary | ICD-10-CM | POA: Diagnosis present

## 2020-10-21 DIAGNOSIS — Z794 Long term (current) use of insulin: Secondary | ICD-10-CM | POA: Diagnosis not present

## 2020-10-21 DIAGNOSIS — Z8249 Family history of ischemic heart disease and other diseases of the circulatory system: Secondary | ICD-10-CM | POA: Diagnosis not present

## 2020-10-21 DIAGNOSIS — E1169 Type 2 diabetes mellitus with other specified complication: Secondary | ICD-10-CM | POA: Diagnosis not present

## 2020-10-21 DIAGNOSIS — Z888 Allergy status to other drugs, medicaments and biological substances status: Secondary | ICD-10-CM | POA: Diagnosis not present

## 2020-10-21 DIAGNOSIS — L97519 Non-pressure chronic ulcer of other part of right foot with unspecified severity: Secondary | ICD-10-CM | POA: Diagnosis not present

## 2020-10-21 LAB — BASIC METABOLIC PANEL
Anion gap: 5 (ref 5–15)
BUN: 15 mg/dL (ref 6–20)
CO2: 26 mmol/L (ref 22–32)
Calcium: 8.9 mg/dL (ref 8.9–10.3)
Chloride: 106 mmol/L (ref 98–111)
Creatinine, Ser: 1.11 mg/dL (ref 0.61–1.24)
GFR, Estimated: >60 mL/min (ref >60)
Glucose, Bld: 159 mg/dL — ABNORMAL HIGH (ref 70–99)
Potassium: 4.9 mmol/L (ref 3.5–5.1)
Sodium: 137 mmol/L (ref 135–145)

## 2020-10-21 NOTE — Progress Notes (Signed)
      Enhanced Recovery after Surgery for Orthopedics Enhanced Recovery after Surgery is a protocol used to improve the stress on your body and your recovery after surgery.  Patient Instructions  The night before surgery:  No food after midnight. ONLY clear liquids after midnight  The day of surgery (if you do NOT have diabetes):  Drink ONE (1) Pre-Surgery Clear Ensure as directed.   This drink was given to you during your hospital  pre-op appointment visit. The pre-op nurse will instruct you on the time to drink the  Pre-Surgery Ensure depending on your surgery time. Finish the drink at the designated time by the pre-op nurse.  Nothing else to drink after completing the  Pre-Surgery Clear Ensure.  The day of surgery (if you have diabetes): Drink ONE (1) Gatorade 2 (G2) as directed. This drink was given to you during your hospital  pre-op appointment visit.  The pre-op nurse will instruct you on the time to drink the   Gatorade 2 (G2) depending on your surgery time. Color of the Gatorade may vary. Red is not allowed. Nothing else to drink after completing the  Gatorade 2 (G2).         If you have questions, please contact your surgeon's office. 

## 2020-10-22 ENCOUNTER — Encounter (HOSPITAL_BASED_OUTPATIENT_CLINIC_OR_DEPARTMENT_OTHER): Payer: Self-pay | Admitting: Orthopedic Surgery

## 2020-10-22 ENCOUNTER — Encounter (HOSPITAL_BASED_OUTPATIENT_CLINIC_OR_DEPARTMENT_OTHER)
Admission: RE | Disposition: A | Discharge status: Home / Self Care | Payer: Self-pay | Source: Home / Self Care | Attending: Orthopedic Surgery

## 2020-10-22 ENCOUNTER — Ambulatory Visit (HOSPITAL_BASED_OUTPATIENT_CLINIC_OR_DEPARTMENT_OTHER): Payer: BC Managed Care – PPO | Admitting: Anesthesiology

## 2020-10-22 ENCOUNTER — Ambulatory Visit (HOSPITAL_BASED_OUTPATIENT_CLINIC_OR_DEPARTMENT_OTHER)
Admission: RE | Admit: 2020-10-22 | Discharge: 2020-10-22 | Disposition: A | Discharge status: Home / Self Care | Payer: BC Managed Care – PPO | Attending: Orthopedic Surgery | Admitting: Orthopedic Surgery

## 2020-10-22 DIAGNOSIS — E11621 Type 2 diabetes mellitus with foot ulcer: Secondary | ICD-10-CM | POA: Insufficient documentation

## 2020-10-22 DIAGNOSIS — Z794 Long term (current) use of insulin: Secondary | ICD-10-CM | POA: Insufficient documentation

## 2020-10-22 DIAGNOSIS — E1142 Type 2 diabetes mellitus with diabetic polyneuropathy: Secondary | ICD-10-CM | POA: Insufficient documentation

## 2020-10-22 DIAGNOSIS — Z79899 Other long term (current) drug therapy: Secondary | ICD-10-CM | POA: Insufficient documentation

## 2020-10-22 DIAGNOSIS — E1169 Type 2 diabetes mellitus with other specified complication: Secondary | ICD-10-CM | POA: Insufficient documentation

## 2020-10-22 DIAGNOSIS — Z8249 Family history of ischemic heart disease and other diseases of the circulatory system: Secondary | ICD-10-CM | POA: Insufficient documentation

## 2020-10-22 DIAGNOSIS — Z888 Allergy status to other drugs, medicaments and biological substances status: Secondary | ICD-10-CM | POA: Insufficient documentation

## 2020-10-22 DIAGNOSIS — Z833 Family history of diabetes mellitus: Secondary | ICD-10-CM | POA: Insufficient documentation

## 2020-10-22 DIAGNOSIS — M869 Osteomyelitis, unspecified: Secondary | ICD-10-CM | POA: Insufficient documentation

## 2020-10-22 DIAGNOSIS — L97519 Non-pressure chronic ulcer of other part of right foot with unspecified severity: Secondary | ICD-10-CM | POA: Insufficient documentation

## 2020-10-22 HISTORY — PX: AMPUTATION: SHX166

## 2020-10-22 LAB — GLUCOSE, CAPILLARY
Glucose-Capillary: 92 mg/dL (ref 70–99)
Glucose-Capillary: 70 mg/dL (ref 70–99)

## 2020-10-22 SURGERY — AMPUTATION, FOOT, RAY
Anesthesia: General | Site: Foot | Laterality: Right

## 2020-10-22 MED ORDER — LIDOCAINE HCL (CARDIAC) PF 100 MG/5ML IV SOSY
PREFILLED_SYRINGE | INTRAVENOUS | Status: DC | PRN
  Administered 2020-10-22: 60 mg via INTRAVENOUS

## 2020-10-22 MED ORDER — HYDROMORPHONE HCL 1 MG/ML IJ SOLN: 0.2500 mg | INTRAMUSCULAR | Status: DC | PRN

## 2020-10-22 MED ORDER — MIDAZOLAM HCL 2 MG/2ML IJ SOLN
2.0000 mg | Freq: Once | INTRAMUSCULAR | Status: AC
  Administered 2020-10-22: 2 mg via INTRAVENOUS

## 2020-10-22 MED ORDER — MIDAZOLAM HCL 2 MG/2ML IJ SOLN
INTRAMUSCULAR | Status: AC
  Filled 2020-10-22: qty 2

## 2020-10-22 MED ORDER — PROPOFOL 10 MG/ML IV BOLUS
INTRAVENOUS | Status: DC | PRN
  Administered 2020-10-22: 400 mg via INTRAVENOUS

## 2020-10-22 MED ORDER — CEFAZOLIN IN SODIUM CHLORIDE 3-0.9 GM/100ML-% IV SOLN
INTRAVENOUS | Status: AC
  Filled 2020-10-22: qty 100

## 2020-10-22 MED ORDER — EPHEDRINE SULFATE 50 MG/ML IJ SOLN
INTRAMUSCULAR | Status: DC | PRN
  Administered 2020-10-22: 15 mg via INTRAVENOUS

## 2020-10-22 MED ORDER — ACETAMINOPHEN 500 MG PO TABS
ORAL_TABLET | ORAL | Status: AC
  Filled 2020-10-22: qty 2

## 2020-10-22 MED ORDER — DROPERIDOL 2.5 MG/ML IJ SOLN
INTRAMUSCULAR | Status: DC | PRN
  Administered 2020-10-22: .625 mg via INTRAVENOUS

## 2020-10-22 MED ORDER — 0.9 % SODIUM CHLORIDE (POUR BTL) OPTIME
TOPICAL | Status: DC | PRN
  Administered 2020-10-22: 700 mL

## 2020-10-22 MED ORDER — ACETAMINOPHEN 500 MG PO TABS
1000.0000 mg | ORAL_TABLET | Freq: Once | ORAL | Status: AC
  Administered 2020-10-22: 1000 mg via ORAL

## 2020-10-22 MED ORDER — FENTANYL CITRATE (PF) 100 MCG/2ML IJ SOLN
INTRAMUSCULAR | Status: AC
  Filled 2020-10-22: qty 2

## 2020-10-22 MED ORDER — DEXAMETHASONE SODIUM PHOSPHATE 10 MG/ML IJ SOLN
INTRAMUSCULAR | Status: DC | PRN
  Administered 2020-10-22: 5 mg via INTRAVENOUS

## 2020-10-22 MED ORDER — SODIUM CHLORIDE 0.9 % IV SOLN: INTRAVENOUS | Status: DC

## 2020-10-22 MED ORDER — PROMETHAZINE HCL 25 MG/ML IJ SOLN: 6.2500 mg | INTRAMUSCULAR | Status: DC | PRN

## 2020-10-22 MED ORDER — OXYCODONE HCL 5 MG/5ML PO SOLN
5.0000 mg | Freq: Once | ORAL | Status: DC | PRN
Start: 2020-10-22 — End: 2020-10-22

## 2020-10-22 MED ORDER — CEFAZOLIN IN SODIUM CHLORIDE 3-0.9 GM/100ML-% IV SOLN
3.0000 g | INTRAVENOUS | Status: AC
  Administered 2020-10-22: 3 g via INTRAVENOUS

## 2020-10-22 MED ORDER — FENTANYL CITRATE (PF) 100 MCG/2ML IJ SOLN
50.0000 ug | Freq: Once | INTRAMUSCULAR | Status: AC
Start: 2020-10-22 — End: 2020-10-22
  Administered 2020-10-22: 50 ug via INTRAVENOUS

## 2020-10-22 MED ORDER — OXYCODONE HCL 5 MG PO TABS: 5.0000 mg | ORAL_TABLET | Freq: Once | ORAL | Status: DC | PRN

## 2020-10-22 MED ORDER — ONDANSETRON HCL 4 MG/2ML IJ SOLN
INTRAMUSCULAR | Status: DC | PRN
  Administered 2020-10-22: 4 mg via INTRAVENOUS

## 2020-10-22 MED ORDER — VANCOMYCIN HCL 500 MG IV SOLR
INTRAVENOUS | Status: DC | PRN
  Administered 2020-10-22: 500 mg via TOPICAL

## 2020-10-22 MED ORDER — ROPIVACAINE HCL 5 MG/ML IJ SOLN
INTRAMUSCULAR | Status: DC | PRN
  Administered 2020-10-22: 30 mL via PERINEURAL

## 2020-10-22 MED ORDER — LACTATED RINGERS IV SOLN: INTRAVENOUS | Status: DC

## 2020-10-22 SURGICAL SUPPLY — 65 items
BLADE AVERAGE 25X9 (BLADE) ×2 IMPLANT
BLADE MICRO SAGITTAL (BLADE) IMPLANT
BLADE OSC/SAG .038X5.5 CUT EDG (BLADE) IMPLANT
BLADE SURG 10 STRL SS (BLADE) ×2 IMPLANT
BLADE SURG 15 STRL LF DISP TIS (BLADE) ×1 IMPLANT
BLADE SURG 15 STRL SS (BLADE) ×1
BNDG COHESIVE 4X5 TAN ST LF (GAUZE/BANDAGES/DRESSINGS) ×2 IMPLANT
BNDG COHESIVE 4X5 TAN STRL (GAUZE/BANDAGES/DRESSINGS) ×2 IMPLANT
BNDG CONFORM 3 STRL LF (GAUZE/BANDAGES/DRESSINGS) ×2 IMPLANT
BNDG ESMARK 4X9 LF (GAUZE/BANDAGES/DRESSINGS) ×2 IMPLANT
BNDG GAUZE ELAST 4 BULKY (GAUZE/BANDAGES/DRESSINGS) ×2 IMPLANT
BRUSH SCRUB EZ PLAIN DRY (MISCELLANEOUS) ×2 IMPLANT
CHLORAPREP W/TINT 26 (MISCELLANEOUS) IMPLANT
COVER BACK TABLE 60X90IN (DRAPES) ×2 IMPLANT
DECANTER SPIKE VIAL GLASS SM (MISCELLANEOUS) IMPLANT
DRAPE EXTREMITY T 121X128X90 (DISPOSABLE) ×2 IMPLANT
DRAPE OEC MINIVIEW 54X84 (DRAPES) IMPLANT
DRAPE SURG 17X23 STRL (DRAPES) IMPLANT
DRAPE U-SHAPE 47X51 STRL (DRAPES) IMPLANT
DRESSING MEPILEX FLEX 4X4 (GAUZE/BANDAGES/DRESSINGS) ×1 IMPLANT
DRSG MEPILEX FLEX 4X4 (GAUZE/BANDAGES/DRESSINGS) ×2
DRSG MEPITEL 4X7.2 (GAUZE/BANDAGES/DRESSINGS) ×2 IMPLANT
DRSG PAD ABDOMINAL 8X10 ST (GAUZE/BANDAGES/DRESSINGS) ×6 IMPLANT
ELECT REM PT RETURN 9FT ADLT (ELECTROSURGICAL) ×2
ELECTRODE REM PT RTRN 9FT ADLT (ELECTROSURGICAL) ×1 IMPLANT
GAUZE SPONGE 4X4 12PLY STRL (GAUZE/BANDAGES/DRESSINGS) ×4 IMPLANT
GLOVE SRG 8 PF TXTR STRL LF DI (GLOVE) ×1 IMPLANT
GLOVE SURG ENC MOIS LTX SZ8 (GLOVE) ×2 IMPLANT
GLOVE SURG LTX SZ8 (GLOVE) IMPLANT
GLOVE SURG POLYISO LF SZ7 (GLOVE) ×2 IMPLANT
GLOVE SURG UNDER POLY LF SZ7 (GLOVE) ×2 IMPLANT
GLOVE SURG UNDER POLY LF SZ8 (GLOVE) ×1
GOWN STRL REUS W/ TWL LRG LVL3 (GOWN DISPOSABLE) ×1 IMPLANT
GOWN STRL REUS W/ TWL XL LVL3 (GOWN DISPOSABLE) ×1 IMPLANT
GOWN STRL REUS W/TWL LRG LVL3 (GOWN DISPOSABLE) ×1
GOWN STRL REUS W/TWL XL LVL3 (GOWN DISPOSABLE) ×1
NDL SAFETY ECLIPSE 18X1.5 (NEEDLE) IMPLANT
NEEDLE HYPO 18GX1.5 SHARP (NEEDLE)
NEEDLE HYPO 25X1 1.5 SAFETY (NEEDLE) IMPLANT
NS IRRIG 1000ML POUR BTL (IV SOLUTION) ×2 IMPLANT
PACK BASIN DAY SURGERY FS (CUSTOM PROCEDURE TRAY) ×2 IMPLANT
PAD CAST 4YDX4 CTTN HI CHSV (CAST SUPPLIES) ×1 IMPLANT
PADDING CAST COTTON 4X4 STRL (CAST SUPPLIES) ×1
PENCIL SMOKE EVACUATOR (MISCELLANEOUS) ×2 IMPLANT
SANITIZER HAND PURELL 535ML FO (MISCELLANEOUS) IMPLANT
SHEET MEDIUM DRAPE 40X70 STRL (DRAPES) ×2 IMPLANT
SPONGE T-LAP 18X18 ~~LOC~~+RFID (SPONGE) ×4 IMPLANT
STOCKINETTE 6  STRL (DRAPES) ×1
STOCKINETTE 6 STRL (DRAPES) ×1 IMPLANT
SUCTION FRAZIER HANDLE 10FR (MISCELLANEOUS) ×1
SUCTION TUBE FRAZIER 10FR DISP (MISCELLANEOUS) ×1 IMPLANT
SUT ETHILON 2 0 FS 18 (SUTURE) ×4 IMPLANT
SUT ETHILON 2 0 FSLX (SUTURE) IMPLANT
SUT ETHILON 3 0 PS 1 (SUTURE) IMPLANT
SUT MNCRL AB 3-0 PS2 18 (SUTURE) IMPLANT
SUT PDS AB 0 CT 36 (SUTURE) IMPLANT
SUT PDS AB 2-0 CT2 27 (SUTURE) IMPLANT
SWAB COLLECTION DEVICE MRSA (MISCELLANEOUS) IMPLANT
SYR BULB EAR ULCER 3OZ GRN STR (SYRINGE) ×2 IMPLANT
SYR CONTROL 10ML LL (SYRINGE) IMPLANT
TOWEL GREEN STERILE FF (TOWEL DISPOSABLE) ×2 IMPLANT
TRAY DSU PREP LF (CUSTOM PROCEDURE TRAY) ×2 IMPLANT
TUBE CONNECTING 20X1/4 (TUBING) ×2 IMPLANT
UNDERPAD 30X36 HEAVY ABSORB (UNDERPADS AND DIAPERS) ×8 IMPLANT
YANKAUER SUCT BULB TIP NO VENT (SUCTIONS) ×2 IMPLANT

## 2020-10-22 NOTE — Anesthesia Procedure Notes (Signed)
Anesthesia Regional Block: Popliteal block   Pre-Anesthetic Checklist: , timeout performed,  Correct Patient, Correct Site, Correct Laterality,  Correct Procedure, Correct Position, site marked,  Risks and benefits discussed,  Surgical consent,  Pre-op evaluation,  At surgeon's request and post-op pain management  Laterality: Right  Prep: chloraprep       Needles:  Injection technique: Single-shot  Needle Type: Echogenic Stimulator Needle     Needle Length: 10cm  Needle Gauge: 20     Additional Needles:   Procedures:,,,, ultrasound used (permanent image in chart),,    Narrative:  Start time: 10/22/2020 1:00 PM End time: 10/22/2020 1:10 PM Injection made incrementally with aspirations every 5 mL.  Performed by: Personally  Anesthesiologist: Heather Roberts, MD  Additional Notes: A functioning IV was confirmed and monitors were applied.  Sterile prep and drape, hand hygiene and sterile gloves were used.  Negative aspiration and test dose prior to incremental administration of local anesthetic. The patient tolerated the procedure well.Ultrasound  guidance: relevant anatomy identified, needle position confirmed, local anesthetic spread visualized around nerve(s), vascular puncture avoided.  Image printed for medical record.

## 2020-10-22 NOTE — Discharge Instructions (Addendum)
Toni Arthurs, MD [facility]  Please read the following information regarding your care after surgery.  Medications  You only need a prescription for the narcotic pain medicine (ex. oxycodone, Percocet, Norco).  All of the other medicines listed below are available over the counter. ? Aleve 2 pills twice a day for the first 3 days after surgery. X acetominophen (Tylenol) 650 mg every 4-6 hours as you need for minor to moderate pain ? oxycodone as prescribed for severe pain  Narcotic pain medicine (ex. oxycodone, Percocet, Vicodin) will cause constipation.  To prevent this problem, take the following medicines while you are taking any pain medicine. ? docusate sodium (Colace) 100 mg twice a day ? senna (Senokot) 2 tablets twice a day  ? To help prevent blood clots, take a baby aspirin (81 mg) twice a day for two weeks after surgery.  You should also get up every hour while you are awake to move around.    Weight Bearing ? Bear weight when you are able on your operated leg or foot. X Bear weight only on your operated foot in the post-op shoe. ? Do not bear any weight on the operated leg or foot.  Cast / Splint / Dressing X Keep your splint, cast or dressing clean and dry.  Don't put anything (coat hanger, pencil, etc) down inside of it.  If it gets damp, use a hair dryer on the cool setting to dry it.  If it gets soaked, call the office to schedule an appointment for a cast change. ? Remove your dressing 3 days after surgery and cover the incisions with dry dressings.    After your dressing, cast or splint is removed; you may shower, but do not soak or scrub the wound.  Allow the water to run over it, and then gently pat it dry.  Swelling It is normal for you to have swelling where you had surgery.  To reduce swelling and pain, keep your toes above your nose for at least 3 days after surgery.  It may be necessary to keep your foot or leg elevated for several weeks.  If it hurts, it should be  elevated.  Follow Up Call my office at [phone] when you are discharged from the hospital or surgery center to schedule an appointment to be seen two weeks after surgery.  Call my office at [phone] if you develop a fever >101.5 F, nausea, vomiting, bleeding from the surgical site or severe pain.    No Tylenol until 6:43 pm   Post Anesthesia Home Care Instructions  Activity: Get plenty of rest for the remainder of the day. A responsible individual must stay with you for 24 hours following the procedure.  For the next 24 hours, DO NOT: -Drive a car -Advertising copywriter -Drink alcoholic beverages -Take any medication unless instructed by your physician -Make any legal decisions or sign important papers.  Meals: Start with liquid foods such as gelatin or soup. Progress to regular foods as tolerated. Avoid greasy, spicy, heavy foods. If nausea and/or vomiting occur, drink only clear liquids until the nausea and/or vomiting subsides. Call your physician if vomiting continues.  Special Instructions/Symptoms: Your throat may feel dry or sore from the anesthesia or the breathing tube placed in your throat during surgery. If this causes discomfort, gargle with warm salt water. The discomfort should disappear within 24 hours.  If you had a scopolamine patch placed behind your ear for the management of post- operative nausea and/or vomiting:  1.  The medication in the patch is effective for 72 hours, after which it should be removed.  Wrap patch in a tissue and discard in the trash. Wash hands thoroughly with soap and water. 2. You may remove the patch earlier than 72 hours if you experience unpleasant side effects which may include dry mouth, dizziness or visual disturbances. 3. Avoid touching the patch. Wash your hands with soap and water after contact with the patch.

## 2020-10-22 NOTE — Op Note (Signed)
10/22/2020  2:57 PM  PATIENT:  [name]  35 y.o. male  PRE-OPERATIVE DIAGNOSIS:  Right foot diabetic ulcer and osteomyelitis  POST-OPERATIVE DIAGNOSIS:  Right foot diabetic ulcer and osteomyelitis  Procedure(s):  Right Fifth ray amputation  SURGEON:  Toni Arthurs, MD  ASSISTANT: none  ANESTHESIA:   General  EBL:  minimal   TOURNIQUET:   Total Tourniquet Time Documented: Calf (Right) - 26 minutes Total: Calf (Right) - 26 minutes  COMPLICATIONS:  None apparent  DISPOSITION:  Extubated, awake and stable to recovery.  INDICATION FOR PROCEDURE: The patient is a 35 year old male with a past medical history significant for type 2 diabetes complicated by peripheral neuropathy.  He has a nonhealing ulcer of the plantar lateral right forefoot with underlying osteomyelitis of the fifth metatarsal head.  He has failed nonoperative treatment to date including extensive wound care.  He presents now for fifth ray amputation.  The risks and benefits of the alternative treatment options have been discussed in detail.  The patient wishes to proceed with surgery and specifically understands risks of bleeding, infection, nerve damage, blood clots, need for additional surgery, amputation and death.    PROCEDURE IN DETAIL:  After pre operative consent was obtained, and the correct operative site was identified, the patient was brought to the operating room and placed supine on the OR table.  Anesthesia was administered.  Pre-operative antibiotics were administered.  A surgical timeout was taken.  The right lower extremity was prepped and draped in standard sterile fashion with a tourniquet around the calf.  The extremity was elevated and the tourniquet inflated to 200 mmHg.  A racquet incision was made around the base of the fifth toe extending down the glabrous border of the lateral forefoot.  The plantar ulcer was excised in its entirety.  Dissection was carried down to the fifth metatarsal shaft.   Subperiosteal dissection was carried proximally along the metatarsal.  The oscillating saw was used to cut the fifth metatarsal shaft beveling the cut appropriately.  The fifth ray was then removed in its entirety.  The remaining soft tissues appeared generally healthy.  Neurovascular bundles were cauterized.  The cut surface of bone was smoothed with a rasp.  The wound was irrigated with a liter of normal saline.  500 mg of vancomycin powder was placed in the deep layer of the wound.  The incision was then closed with simple and horizontal mattress sutures of 3-0 nylon.  Sterile dressings were applied followed by a compression wrap.  The tourniquet was released after application of the dressings.  The patient was awakened from anesthesia and transported to the recovery room in stable condition.   FOLLOW UP PLAN: Weightbearing as tolerated on the right heel in a flat postop shoe.  Follow-up in the office in 2 weeks for wound check and possible suture removal.

## 2020-10-22 NOTE — Progress Notes (Signed)
Assisted Dr. Bass with right, ultrasound guided, popliteal block. Side rails up, monitors on throughout procedure. See vital signs in flow sheet. Tolerated Procedure well. °

## 2020-10-22 NOTE — Anesthesia Preprocedure Evaluation (Addendum)
Anesthesia Evaluation  Patient identified by MRN, date of birth, ID band Patient awake    Reviewed: Allergy & Precautions, NPO status , Patient's Chart, lab work & pertinent test results  Airway Mallampati: II  TM Distance: >3 FB Neck ROM: Full    Dental no notable dental hx.    Pulmonary neg pulmonary ROS,    Pulmonary exam normal breath sounds clear to auscultation       Cardiovascular negative cardio ROS Normal cardiovascular exam Rhythm:Regular Rate:Normal     Neuro/Psych negative neurological ROS  negative psych ROS   GI/Hepatic negative GI ROS, Neg liver ROS,   Endo/Other  diabetes, Poorly Controlled, Type 1, Insulin DependentMorbid obesityBMI 42 Last a1c 11.7  Renal/GU negative Renal ROS  negative genitourinary   Musculoskeletal Right foot diabetic ulcer   Abdominal (+) + obese,   Peds  Hematology negative hematology ROS (+)   Anesthesia Other Findings   Reproductive/Obstetrics negative OB ROS                            Anesthesia Physical Anesthesia Plan  ASA: 3  Anesthesia Plan: General and Regional   Post-op Pain Management: GA combined w/ Regional for post-op pain   Induction: Intravenous  PONV Risk Score and Plan: 2 and Ondansetron, Midazolam and Treatment may vary due to age or medical condition  Airway Management Planned: LMA  Additional Equipment: None  Intra-op Plan:   Post-operative Plan: Extubation in OR  Informed Consent: I have reviewed the patients History and Physical, chart, labs and discussed the procedure including the risks, benefits and alternatives for the proposed anesthesia with the patient or authorized representative who has indicated his/her understanding and acceptance.     Dental advisory given  Plan Discussed with: CRNA, Anesthesiologist and Surgeon  Anesthesia Plan Comments:       Anesthesia Quick Evaluation

## 2020-10-22 NOTE — H&P (Signed)
[name] is an 35 y.o. male.   Chief Complaint: Right foot diabetic ulcer and osteomyelitis HPI: The patient is a 35 year old male with a past medical history significant for type 2 diabetes complicated by peripheral neuropathy.  He is known to me from previous left fifth ray amputation.  He has developed a right forefoot ulcer which has not healed despite months of wound center treatment.  Bone is exposed within the wound.  He presents now for right fifth ray amputation.  Past Medical History:  Diagnosis Date   Diabetes mellitus    Osteomyelitis [facility])     Past Surgical History:  Procedure Laterality Date   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;  Surgeon: Toni Arthurs, MD;  Location: [facility];  Service: Orthopedics;  Laterality: Left;   WOUND DEBRIDEMENT Bilateral 10/02/2019   Procedure: DEBRIDEMENT WOUNDS BOTH LOWER EXTREMITIES WITH BONE BIOPSIES;  Surgeon: Vivi Barrack, DPM;  Location: [facility] OR;  Service: Podiatry;  Laterality: Bilateral;    Family History  Problem Relation Age of Onset   Hypertension Other    Diabetes Other    Healthy Mother    Colon cancer Neg Hx    Stomach cancer Neg Hx    Pancreatic cancer Neg Hx    Esophageal cancer Neg Hx    Social History:  reports that he has never smoked. He has never used smokeless tobacco. He reports that he does not drink alcohol and does not use drugs.  Allergies:  Allergies  Allergen Reactions   Basaglar Kwikpen [Insulin Glargine] Diarrhea   Metformin And Related Diarrhea and Nausea And Vomiting   Trulicity [Dulaglutide] Diarrhea    Medications Prior to Admission  Medication Sig Dispense Refill   Continuous Blood Gluc Sensor (FREESTYLE LIBRE 14 DAY SENSOR) MISC Inject 1 patch into the skin every 14 (fourteen) days.     Continuous Blood Gluc Sensor MISC 1 each by Does not apply route as directed. Use as directed every 14 days. May dispense FreeStyle [employer] or  similar. 2 each 0   doxycycline (VIBRA-TABS) 100 MG tablet Take 1 tablet (100 mg total) by mouth 2 (two) times daily. 20 tablet 0   insulin aspart (NOVOLOG) 100 UNIT/ML injection Inject 0-15 Units into the skin 3 (three) times daily with meals. (Patient taking differently: Inject 7-12 Units into the skin 3 (three) times daily as needed (before meals, if BGL is 250 or greater).) 10 mL 1   Insulin Disposable Pump (OMNIPOD DASH 5 PACK PODS) MISC Inject into the skin.     insulin glargine (LANTUS SOLOSTAR) 100 UNIT/ML Solostar Pen Inject 40 Units into the skin daily before breakfast. (Patient taking differently: Inject 60 Units into the skin daily before breakfast.) 15 mL 2   SANTYL ointment Apply topically daily.     silver sulfADIAZINE (SILVADENE) 1 % cream Apply pea-sized amount to wound daily. 50 g 0   benzonatate (TESSALON) 100 MG capsule Take 1 capsule (100 mg total) by mouth every 8 (eight) hours. 21 capsule 0    Results for orders placed or performed during the hospital encounter of 10/22/20 (from the past 48 hour(s))  Basic metabolic panel per protocol     Status: Abnormal   Collection Time: 10/21/20 12:54 PM  Result Value Ref Range   Sodium 137 135 - 145 mmol/L   Potassium 4.9 3.5 - 5.1 mmol/L   Chloride 106 98 - 111 mmol/L   CO2 26 22 - 32 mmol/L  Glucose, Bld 159 (H) 70 - 99 mg/dL    Comment: Glucose reference range applies only to samples taken after fasting for at least 8 hours.   BUN 15 6 - 20 mg/dL   Creatinine, Ser 7.51 0.61 - 1.24 mg/dL   Calcium 8.9 8.9 - 02.5 mg/dL   GFR, Estimated >85 >27 mL/min    Comment: (NOTE) Calculated using the CKD-EPI Creatinine Equation (2021)    Anion gap 5 5 - 15    Comment: Performed at [facility], [address] [place], [place] [address]  Glucose, capillary     Status: None   Collection Time: 11/05/20 12:44 PM  Result Value Ref Range   Glucose-Capillary 92 70 - 99 mg/dL    Comment: Glucose reference range applies only to  samples taken after fasting for at least 8 hours.   Comment 1 Notify RN    Comment 2 Document in Chart    No results found.  Review of Systems no recent fever, chills, nausea, vomiting or changes in his appetite  Blood pressure 119/68, pulse 76, temperature 98.2 F (36.8 C), temperature source Oral, resp. rate 11, height 6\' 5"  (1.956 m), weight (!) 160.6 kg, SpO2 96 %. Physical Exam  Well-nourished well-developed man in no apparent distress.  Alert and oriented x4.  Normal mood and affect.  Gait is flatfoot flatfoot on the right in a flat postop shoe.  The right forefoot has an ulcer lateral to the fifth metatarsal head.  A cotton-tipped applicator easily probes to bone.  There is serous drainage.  Pulses are palpable in the foot.  Absent sensibility to light touch dorsally and plantarly at the forefoot.  5 out of 5 strength in plantarflexion and dorsiflexion of the ankle and toes.   Assessment/Plan Right forefoot nonhealing diabetic ulcer and underlying osteomyelitis -to the operating room today for right foot fifth ray amputation and possible fourth ray amputation.  The risks and benefits of the alternative treatment options have been discussed in detail.  The patient wishes to proceed with surgery and specifically understands risks of bleeding, infection, nerve damage, blood clots, need for additional surgery, amputation and death.   , MD 2020-11-05, 1:47 PM

## 2020-10-22 NOTE — OR Nursing (Signed)
After moving patient from OR bed to stretcher for transport to PACU, it was noted that the operative site was visibly bleeding through bandage. Contacted surgeon who returned to operating room to assess the surgical wound. Surgeon applied pressure and stopped bleeding. Site was redressed by surgeon with fresh dressings. Patient transported out of room to PACU.

## 2020-10-22 NOTE — Transfer of Care (Signed)
Immediate Anesthesia Transfer of Care Note  Patient: [name]  Procedure(s) Performed: Right Fifth ray amputation (Right: Foot)  Patient Location: PACU  Anesthesia Type:GA combined with regional for post-op pain  Level of Consciousness: awake, alert , oriented, drowsy and patient cooperative  Airway & Oxygen Therapy: Patient Spontanous Breathing and Patient connected to face mask oxygen  Post-op Assessment: Report given to RN and Post -op Vital signs reviewed and stable  Post vital signs: Reviewed and stable  Last Vitals:  Vitals Value Taken Time  BP 123/79 10/22/20 1518  Temp 36.4 C 10/22/20 1518  Pulse 76 10/22/20 1519  Resp 18 10/22/20 1519  SpO2 96 % 10/22/20 1519  Vitals shown include unvalidated device data.  Last Pain:  Vitals:   10/22/20 1230  TempSrc: Oral  PainSc: 3       Patients Stated Pain Goal: 3 (10/22/20 1230)  Complications: No notable events documented.

## 2020-10-22 NOTE — Anesthesia Postprocedure Evaluation (Signed)
Anesthesia Post Note  Patient: [name]  Procedure(s) Performed: Right Fifth ray amputation (Right: Foot)     Patient location during evaluation: PACU Anesthesia Type: General Level of consciousness: awake and alert Pain management: pain level controlled Vital Signs Assessment: post-procedure vital signs reviewed and stable Respiratory status: spontaneous breathing, nonlabored ventilation and respiratory function stable Cardiovascular status: blood pressure returned to baseline and stable Postop Assessment: no apparent nausea or vomiting Anesthetic complications: no   No notable events documented.  Last Vitals:  Vitals:   10/22/20 1518 10/22/20 1530  BP: 123/79 127/76  Pulse: 82 70  Resp: (!) 21 13  Temp: 36.4 C   SpO2: 96% 98%    Last Pain:  Vitals:   10/22/20 1530  TempSrc:   PainSc: Asleep                 Mellody Dance

## 2020-10-22 NOTE — Anesthesia Procedure Notes (Addendum)
Procedure Name: LMA Insertion Date/Time: 10/22/2020 2:01 PM Performed by: Ronnette Hila, CRNA Pre-anesthesia Checklist: Patient identified, Emergency Drugs available, Suction available and Patient being monitored Patient Re-evaluated:Patient Re-evaluated prior to induction Oxygen Delivery Method: Circle system utilized Preoxygenation: Pre-oxygenation with 100% oxygen Induction Type: IV induction Ventilation: Mask ventilation without difficulty LMA: LMA inserted LMA Size: 5.0 Number of attempts: 1 Airway Equipment and Method: Bite block Placement Confirmation: positive ETCO2 Tube secured with: Tape Dental Injury: Teeth and Oropharynx as per pre-operative assessment

## 2020-10-23 ENCOUNTER — Encounter (HOSPITAL_BASED_OUTPATIENT_CLINIC_OR_DEPARTMENT_OTHER): Payer: Self-pay | Admitting: Orthopedic Surgery

## 2020-10-26 NOTE — Progress Notes (Signed)
[name], [name] ([id number]) Visit Report for 10/21/2020 HPI Details Patient Name: Date of Service: [name] 10/21/2020 1:00 PM Medical Record Number: [id number] Patient Account Number: [id number] Date of Birth/Sex: Treating RN: [birth year] (35 y.o. 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 He comes in for review of a wound over the plantar fifth metatarsal head extending into the lateral part of the foot. He was followed for this previously by his podiatrist Dr. Cornelius [name]. As the patient tells his story he went to see podiatry first for a swelling he developed on the lateral part of his fifth metatarsal head in May. He states this was "open" by podiatry and the area closed. He was followed up in June and it was again opened callus removed and it closed promptly. There were plans being made for surgery on the fifth metatarsal head in June however his blood sugar was apparently too high for anesthesia. Apparently the area was debrided and opened again in June and it is never closed since. Looking over the records from podiatry I am really not able to follow this. It was clear when he was first seen it was before 5/14 at that point he already had a wound. By 5/17 the ulcer was resolved. I do not see anything about a procedure. On 5/28 noted to have pre-ulcerative moderate keratosis. X-ray noted 1/5 contracted toe and tailor's bunion and metatarsal deformity. On a visit date on 09/28/2018 the dorsal part of the left foot it healed and resolved. There was concern about swelling in his lower extremity he was sent to the ER.. As far as I can tell he was seen in the ER on 7/12 with an ulcer on his left foot. A DVT rule out  of the left leg was negative. I do not think I have complete records from podiatry but I am not able to verify the procedures this patient states he had. He states after the last procedure the wound has never closed although I am not able to follow this in the records I have from podiatry. He has not had a recent x-ray The patient has been using Neosporin on the wound. He is wearing a Darco shoe. He is still very active up on his foot working and exercising. Past medical history; type 2 diabetes ketosis-prone, leg swelling with a negative DVT study in July. Non-smoker ABI in our clinic was 0.85 on the left 10/16; substantial wound on the plantar left fifth met head extending laterally almost to the dorsal fifth MTP. We have been using silver alginate we gave him a Darco forefoot off loader. An x-ray did not show evidence of osteomyelitis did note soft tissue emphysema which I think was due to gas tracking through an open wound. There is no doubt in my mind he requires an MRI 10/23; MRI not booked until 3 November at the earliest this is largely due to his glucose sensor in the right arm. We have been using silver alginate. There has been an improvement 10/29; I am still not exactly sure when his MRI is booked for. He says it is the third but it is the 10th in epic. This definitely needs to be done. He is running a low-grade fever today but no other symptoms. No real improvement  in the 1 02/26/2019 patient presents today for a follow-up visit here in our clinic he is last been seen in the clinic on October 29. Subsequently we were working on getting MRI to evaluate and see what exactly was going on and where we would need to go from the standpoint of whether or not he had osteomyelitis and again what treatments were going be required. Subsequently the patient ended up being admitted to the hospital on 02/07/2019 and was discharged on 02/14/2019. This is a somewhat interesting admission with a discharge  diagnosis of pneumonia due to COVID-19 although he was positive for COVID-19 when tested at the urgent care but negative x2 when he was actually in the hospital. With that being said he did have acute respiratory failure with hypoxia and it was noted he also have a left foot ulceration with osteomyelitis. With that being said he did require oxygen for his pneumonia and I level 4 L. He was placed on antivirals and steroids for the COVID-19. He was also transferred to the [facility] at one point. Nonetheless he did subsequently discharged home and since being home has done much better in that regard. The CT angiogram did not show any pulmonary embolism. With regard to the osteomyelitis the patient was placed on vancomycin and Zosyn while in the hospital but has been changed to Augmentin at discharge. It was also recommended that he follow- up with wound care and podiatry. Podiatry however wanted him to see [name] according to the patient prior to them doing anything further. His hemoglobin A1c was 9.9 as noted in the hospital. Have an MRI of the left foot performed while in the hospital on 02/04/2019. 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 There is an overlying plantar open wound noted an abscess tracking back along the lateral aspect of the fifth metatarsal shaft. There is otherwise diffuse cellulitis and mild fasciitis without findings of polymyositis. The patient did have recently pneumonia secondary to COVID-19 I looked in the chart through epic and it does appear that the patient may need to have an additional x-ray just to ensure everything is cleared and that he has no airspace disease prior to putting him into the [name]. 03/05/2019; patient was readmitted to the clinic last week. He was hospitalized twice for a viral upper respiratory tract infection from 11/1 through 11/4 and then 11/5  through 11/12 ultimately this turned out to be Covid pneumonitis. Although he was discharged on oxygen he is not using it. He says he feels fine. He has no exercise limitation no cough no sputum. His O2 sat in our clinic today was 100% on room air. 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 He received Vanco and Zosyn in the hospital and then was discharged on 2 weeks of Augmentin. I do not see any relevant cultures. He was supposed to follow-up with infectious disease but I do not see that he has an appointment. 12/8; patient saw Dr. Novella Olive of infectious disease last week. He felt that he had had adequate antibiotic therapy. He did not go to follow-up with Dr. Amalia Hailey of podiatry and I have again talked to him about the pros and cons of this. He does not want to consider a ray amputation of this time. He is aware of the risks of  recurrence, migration etc. He started HBO today and tolerated this well. He can complete the Augmentin that I gave him last week. I have looked over the lab work that Dr. Chana Bode ordered his C-reactive protein was 3.3 and his sedimentation rate was 17. The C-reactive protein is never really been measurably that high in this patient 12/15; not much change in the wound today however he has undermining along the lateral part of the foot again more extensively than last week. He has some rims of epithelialization. We have been using silver alginate. He is undergoing hyperbarics but did not dive today 12/18; in for his obligatory first total contact cast change. Unfortunately there was pus coming from the undermining area around his fifth metatarsal head. This was cultured but will preclude reapplication of a cast. He is seen in conjunction with HBO 12/24; patient had staph lugdunensis in the wound in the undermining area laterally last time. We put him on doxycycline  which should have covered this. The wound looks better today. I am going to give him another week of doxycycline before reattempting the total contact cast 12/31; the patient is completing antibiotics. Hemorrhagic debris in the distal part of the wound with some undermining distally. He also had hyper granulation. Extensive debridement with a #5 curette. The infected area that was on the lateral part of the fifth met head is closed over. I do not think he needs any more antibiotics. Patient was seen prior to HBO. Preparations for a total contact cast were made in the cast will be placed post hyperbarics 04/11/19; once again the patient arrives today without complaint. He had been in a cast all week noted that he had heavy drainage this week. This resulted in large raised areas of macerated tissue around the wound 1/14; wound bed looks better slightly smaller. Hydrofera Blue has been changing himself. He had a heavy drainage last week which caused a lot of maceration around the wound so I took him out of a total contact cast he says the drainage is actually better this week He is seen today in conjunction with HBO 1/21; returns to clinic. He was up in [place] for a day or 2 attending a funeral. He comes back in with the wound larger and with a large area of exposed bone. He had osteomyelitis and septic arthritis of the fifth left metatarsal head while he was in hospital. He received IV antibiotics in the hospital for a prolonged period of time then 3 weeks of Augmentin. Subsequently I gave him 2 weeks of doxycycline for more superficial wound infection. When I saw this last week the wound was smaller the surface of the wound looks satisfactory. 1/28; patient missed hyperbarics today. Bone biopsy I did last time showed Enterococcus faecalis and Staphylococcus lugdunensis . He has a wide area of exposed bone. We are going to use silver alginate as of today. I had another ethical discussion with the  patient. 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 In this situation I think the likelihood of healing this is low. Therefore I have recommended a ray amputation and with the patient's agreement I have referred him to Dr. Doran Durand. The other issue is that his compliance with hyperbarics has been minimal because of his work schedule and given his underlying decision I am going to stop this today READMISSION 10/24/2019 MRI 09/29/2019 left foot IMPRESSION: 1. Apparent skin ulceration inferior and lateral to the 5th metatarsal base with underlying heterogeneous T2 signal  and enhancement in the subcutaneous fat. Small peripherally enhancing fluid collections along the plantar and lateral aspects of the 5th metatarsal base suspicious for abscesses. 2. Interval amputation through the mid 5th metatarsal with nonspecific low-level marrow edema and enhancement. Given the proximity to the adjacent soft tissue inflammatory changes, osteomyelitis cannot be excluded. 3. The additional bones appear unremarkable. MRI 09/29/2019 right foot IMPRESSION: 1. Soft tissue ulceration lateral to the 5th MTP joint. There is low-level T2 hyperintensity within the 4th and 5th metatarsal heads and adjacent proximal phalanges without abnormal T1 signal or cortical destruction. These findings are nonspecific and could be seen with early marrow edema, hyperemia or early osteomyelitis. No evidence of septic joint. 2. Mild tenosynovitis and synovial enhancement associated with the extensor digitorum tendons at the level of the midfoot. 3. Diffuse low-level muscular T2 hyperintensity and enhancement, most consistent with diabetic myopathy. LEFT FOOT BONE Methicillin resistant staphylococcus aureus Staphylococcus lugdunensis MIC MIC CIPROFLOXACIN >=8 RESISTANT Resistant <=0.5 SENSI... Sensitive CLINDAMYCIN <=0.25 SENS... Sensitive >=8 RESISTANT Resistant ERYTHROMYCIN >=8 RESISTANT  Resistant >=8 RESISTANT Resistant GENTAMICIN <=0.5 SENSI... Sensitive <=0.5 SENSI... Sensitive Inducible Clindamycin NEGATIVE Sensitive NEGATIVE Sensitive OXACILLIN >=4 RESISTANT Resistant 2 SENSITIVE Sensitive RIFAMPIN <=0.5 SENSI... Sensitive <=0.5 SENSI... Sensitive TETRACYCLINE <=1 SENSITIVE Sensitive <=1 SENSITIVE Sensitive TRIMETH/SULFA <=10 SENSIT Sensitive <=10 SENSIT Sensitive ... Marland Kitchen.. VANCOMYCIN 1 SENSITIVE Sensitive <=0.5 SENSI... Sensitive Right foot bone . Component 3 wk ago Specimen Description BONE Special Requests RIGHT 4 METATARSAL SAMPLE B Gram Stain NO WBC SEEN NO ORGANISMS SEEN Culture RARE METHICILLIN RESISTANT STAPHYLOCOCCUS AUREUS NO ANAEROBES ISOLATED Performed at [facility], [place] [address], [place], [place] [address] Report Status 10/08/2019 FINAL Organism ID, Bacteria METHICILLIN RESISTANT STAPHYLOCOCCUS AUREUS Resulting Agency [facility] Susceptibility Methicillin resistant staphylococcus aureus MIC CIPROFLOXACIN >=8 RESISTANT Resistant CLINDAMYCIN <=0.25 SENS... Sensitive ERYTHROMYCIN >=8 RESISTANT Resistant GENTAMICIN <=0.5 SENSI... Sensitive Inducible Clindamycin NEGATIVE Sensitive OXACILLIN >=4 RESISTANT Resistant RIFAMPIN <=0.5 SENSI... Sensitive TETRACYCLINE <=1 SENSITIVE Sensitive TRIMETH/SULFA <=10 SENSIT Sensitive ... VANCOMYCIN 1 SENSITIVE Sensitive This is a patient we had in clinic earlier this year with a wound over his left fifth metatarsal head. 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 Doran Durand on 05/16/2019. He seems to have developed problems with wounds on his bilateral feet in June 2021 although he may have had problems earlier than this. He was  in an urgent care with a right foot ulcer on 09/26/2019 and given a course of doxycycline. This was apparently after having trouble getting into see orthopedics. He was seen by podiatry on 09/28/2019 noted to have bilateral lower extremity ulcers including the left lateral fifth metatarsal base and the right subfifth met head. It was noted that had purulent drainage at that time. He required hospitalization from 6/20 through 7/2. This was because of worsening right foot wounds. He underwent bilateral operative incision and drainage and bone biopsies bilaterally. Culture results are listed above. He has been referred back to clinic by Dr. Jacqualyn Posey of podiatry. He is also followed by Dr. Megan Salon who saw him yesterday. He was discharged from hospital on Zyvox Flagyl and Levaquin and yesterday changed to doxycycline Flagyl and Levaquin. His inflammatory markers on 6/26 showed a sedimentation rate of 129 and a C-reactive protein of 5. This is  improved to 14 and 1.3 respectively. This would indicate improvement. ABIs in our clinic today were 1.23 on the right and 1.20 on the left 11/01/2019 on evaluation today patient appears to be doing fairly well in regard to the wounds on his feet at this point. Fortunately there is no signs of active infection at this time. No fevers, chills, nausea, vomiting, or diarrhea. He currently is seeing infectious disease and still under their care at this point. Subsequently he also has both wounds which she has not been using collagen on as he did not receive that in his packaging he did not call us and let us know that. Apparently that just was missed on the order. Nonetheless we will get that straightened out today. 8/9-Patient returns for bilateral foot wounds, using Prisma with hydrogel moistened dressings, and the wounds appear stable. Patient using surgical shoes, avoiding much pressure or weightbearing as much as possible 8/16; patient has bilateral foot wounds. 1 on the  right lateral foot proximally the other is on the left mid lateral foot. Both required debridement of callus and thick skin around the wounds. We have been using silver collagen 8/27; patient has bilateral lateral foot wounds. The area on the left substantially surrounded by callus and dry skin. This was removed from the wound edge. The underlying wound is small. The area on the right measured somewhat smaller today. 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 Unfortunately I did not update his antibiotics during today's visit. 9/10 I reviewed Dr. Hale Bogus last notes he felt he had completed antibiotics his inflammatory markers were reasonably well controlled. He has a small wound on the lateral left foot and a tiny area on the right which is just above closed. He is using Hydrofera Blue with border foam he has bilateral surgical shoes 9/24; 2 week f/u. doing well. right foot is closed. left foot still undermined. 10/14; right foot remains closed at the fifth met head. The area over the base of the left fifth metatarsal has a small open area but considerable undermining towards the plantar foot. Thick callus skin around this suggests an adequate pressure relief. We have talked about this. He says he is going to go back into his cam boot. I suggested a total contact cast he did not seem enamored with this suggestion 10/26; left foot base of the fifth metatarsal. Same condition as last time. He has skin over the area with an open wound however the skin is not adherent. He went to see Dr. Earleen Newport who did an x-ray and culture of his foot I have not reviewed the x-ray but the patient was not told anything. He is on doxycycline 11/11; since the patient was last here he was in the emergency room on 10/30 he was concerned about swelling in the left foot. They did not do any cultures or x-rays. They changed his antibiotics to cephalexin.  Previous culture showed group B strep. The cephalexin is appropriate as doxycycline has less than predictable coverage. Arrives in clinic today with swelling over this area under the wound. He also has a new wound on the right fifth metatarsal head 11/18; the patient has a difficult wound on the lateral aspect of the left fifth metatarsal head. The wound was almost ballotable last week I opened it slightly expecting to see purulence however there was just bleeding. I cultured this this was negative. X-ray unchanged. We are trying to get an MRI  but I am not sure were going to be able to get this through his insurance. He also has an area on the right lateral fifth metatarsal head this looks healthier 12/3; the patient finally got our MRI. Surprisingly this did not show osteomyelitis. I did show the soft tissue ulceration at the lateral plantar aspect of the fifth metatarsal base with a tiny residual 6 mm abscess overlying the superficial fascia I have tried to culture this area I have not been able to get this to grow anything. Nevertheless the protruding tissue looks aggravated. I suspect we should try to treat the underlying "abscess with broad-spectrum antibiotics. I am going to start him on Levaquin and Flagyl. He has much less edema in his legs and I am going to continue to wrap his legs and see him weekly 12/10. I started Levaquin and Flagyl on him last week. He just picked up the Flagyl apparently there was some delay. The worry is the wound on the left fifth metatarsal base which is substantial and worsening. His foot looks like he inverts at the ankle making this a weightbearing surface. Certainly no improvement in fact I think the measurements of this are somewhat worse. We have been using 12/17; he apparently just got the Levaquin yesterday this is 2 weeks after the fact. He has completed the Flagyl. The area over the left fifth metatarsal base still has protruding granulation tissue although  it does not look quite as bad as it did some weeks ago. He has severe bilateral lymphedema although we have not been treating him for wounds on his legs this is definitely going to require compression. There was so much edema in the left I did not wish to put him in a total contact cast today. I am going to increase his compression from 3-4 layer. The area on the right lateral fifth met head actually look quite good and superficial. 12/23; patient arrived with callus on the right fifth met head and the substantial hyper granulated callused wound on the base of his fifth metatarsal. He says he is completing his Levaquin in 2 days but I do not think that adds up with what I gave him but I will have to double check this. We are using Hydrofera Blue on both areas. My plan is to put the left leg in a cast the week after New Year's 04/06/2020; patient's wounds about the same. Right lateral fifth metatarsal head and left lateral foot over the base of the fifth metatarsal. There is undermining on the left lateral foot which I removed before application of total contact cast continuing with Hydrofera Blue new. Patient tells me he was seen by endocrinology today lab work was done [Dr. Kerr]. Also wondering whether he was referred to cardiology. I went over some lab work from previously does not have chronic renal failure certainly not nephrotic range proteinuria he does have very poorly controlled diabetes but this is not his most updated lab work. Hemoglobin A1c has been over 11 1/10; the patient had a considerable amount of leakage towards mid part of his left foot with macerated skin however the wound surface looks better the area on the right lateral fifth met head is better as well. I am going to change the dressing on the left foot under the total contact cast to silver alginate, continue with Hydrofera Blue on the right. 1/20; patient was in the total contact cast for 10 days. Considerable amount of  drainage although the skin around  the wound does not look too bad on the left foot. The area on the right fifth metatarsal head is closed. Our nursing staff reports large amount of drainage out of the left lateral foot wound 1/25; continues with copious amounts of drainage described by our intake staff. PCR culture I did last week showed E. coli and Enterococcus faecalis and low quantities. Multiple resistance genes documented including extended spectrum beta lactamase, MRSA, MRSE, quinolone, tetracycline. The wound is not quite as good this week as it was 5 days ago but about the same size 2/3; continues with copious amounts of malodorous drainage per our intake nurse. The PCR culture I did 2 weeks ago showed E. coli and low quantities of Enterococcus. There were multiple resistance genes detected. I put Neosporin on him last week although this does not seem to have helped. The wound is slightly deeper today. Offloading continues to be an issue here although with the amount of drainage she has a total contact cast is just not going to work 2/10; moderate amount of drainage. Patient reports he cannot get his stocking on over the dressing. I told him we have to do that the nurse gave him suggestions on how to make this work. The wound is on the bottom and lateral part of his left foot. Is cultured predominantly grew low amounts of Enterococcus, E. coli and anaerobes. There were multiple resistance genes detected including extended spectrum beta lactamase, quinolone, tetracycline. I could not think of an easy oral combination to address this so for now I am going to do topical antibiotics provided by [facility] I think the main agents here are vancomycin and an aminoglycoside. We have to be able to give him access to the wounds to get the topical antibiotic on 2/17; moderate amount of drainage this is unchanged. He has his Keystone topical antibiotic against the deep tissue culture organisms. He has been  using this and changing the dressing daily. Silver alginate on the wound surface. 2/24; using Keystone antibiotic with silver alginate on the top. He had too much drainage for a total contact cast at one point although I think that is improving and I think in the next week or 2 it might be possible to replace a total contact cast I did not do this today. In general the wound surface looks healthy however he continues to have thick rims of skin and subcutaneous tissue around the wide area of the circumference which I debrided 06/04/2020 upon evaluation today patient appears to be doing well in regard to his wound. I do feel like he is showing signs of improvement. There is little bit of callus and dead tissue around the edges of the wound as well as what appears to be a little bit of a sinus tract that is off to the side laterally I would perform debridement to clear that away today. 3/17; left lateral foot. The wound looks about the same as I remember. Not much depth surface looks healthy. No evidence of infection 3/25; left lateral foot. Wound surface looks about the same. Separating epithelium from the circumference. There really is no evidence of infection here however not making progress by my view 3/29; left lateral foot. Surface of the wound again looks reasonably healthy still thick skin and subcutaneous tissue around the wound margins. There is no evidence of infection. One of the concerns being brought up by the nurses has again the amount of drainage vis--vis continued use of a total contact cast 4/5; left  lateral foot at roughly the base of the fifth metatarsal. Nice healthy looking granulated tissue with rims of epithelialization. The overall wound measurements are not any better but the tissue looks healthy. The only concern is the amount of drainage although he has no surrounding maceration with what we have been doing recently to absorb fluid and protect his skin. He also has lymphedema.  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 He states that the kerlix/coban under the TCC rolled down and could not get it back up. He has been using an offloading scooter and has somehow hurt his right foot using this device. This happened last week. He states that the side of his right foot developed a blister and opened. The top of his foot also has a few small open wounds he thinks is due to his socks rubbing in his shoes. He has not been using any dressings to the wound. He denies purulent drainage, fever/chills or erythema to the wounds. 4/22; patient presents for 1 week follow-up. He developed new wounds to the right foot that were evaluated at last clinic visit. He continues to have a total contact cast to the left leg and he reports no issues. He has been using silver collagen to the right foot wounds with no issues. He denies purulent drainage, fever/chills or erythema to the right foot wounds. He has no complaints today 4/25; patient presents for 1 week follow-up. He has a total contact cast of the left leg and reports no issues. He has been using silver alginate to the right foot wound. He denies purulent drainage, fever/chills or erythema to the right foot wounds. 5/2 patient presents for 1 week follow-up. T contact cast on the left. The wound which is on the base of the plantar foot at the base of the fifth metatarsal otal actually looks quite good and dimensions continue to gradually contract. HOWEVER the area on the right lateral fifth metatarsal head is much larger than what I remember from 2 weeks ago. Once more is he has significant levels of hypergranulation. Noteworthy that he had this same hyper granulated response on his wound on the  left foot at one point in time. So much so that he I thought there was an underlying fluid collection. Based on this I think this just needs debridement. 5/9; the wound on the left actually continues to be gradually smaller with a healthy surface. Slight amount of drainage and maceration of the skin around but not too bad. However he has a large wound over the right fifth metatarsal head very much in the same configuration as his left foot wound was initially. I used silver nitrate to address the hyper granulated tissue no mechanical debridement 5/16; area on the left foot did not look as healthy this week deeper thick surrounding macerated skin and subcutaneous tissue. The area on the right foot fifth met head was about the same The area on the right ankle that we identified last week is completely broken down into an open wound presumably a stocking rubbing issue 5/23; patient has been using a total contact cast to the left side. He has been using silver alginate underneath. He has also been using silver alginate to the right foot  wounds. He has no complaints today. He denies any signs of infection. 5/31; the left-sided wound looks some better measure smaller surface granulation looks better. We have been using silver alginate under the total contact cast The large area on his right fifth met head and right dorsal foot look about the same still using silver alginate 6/6; neither side is good as I was hoping although the surface area dimensions are better. A lot of maceration on his left and right foot around the wound edge. Area on the dorsal right foot looks better. He says he was traveling. I am not sure what does the amount of maceration around the plantar wounds may be drainage issues 6/13; in general the wound surfaces look quite good on both sides. Macerated skin and raised edges around the wound required debridement although in general especially on the left the surface area seems  improved. The area on the right dorsal ankle is about the same I thought this would not be such a problem to close 6/20; not much change in either wound although the one on the right looks a little better. Both wounds have thick macerated edges to the skin requiring debridements. We have been using silver alginate. The area on the dorsal right ankle is still open I thought this would be closed. 6/28; patient comes in today with a marked deterioration in the right foot wound fifth met head. Wide area of exposed bone this is a drastic change from last time. The area on the left there we have been casting is stagnant. We have been using silver alginate in both wound areas. 7/5; bone culture I did for PCR last time was positive for Pseudomonas, group B strep, Enterococcus and Staph aureus. There was no suggestion of methicillin resistance or ampicillin resistant genes. This was resistant to tetracycline however He comes into the clinic today with the area over his right plantar fifth metatarsal head which had been doing so well 2 weeks ago completely necrotic feeling bone. I do not know that this is going to be salvageable. The left foot wound is certainly no smaller but it has a better surface and is superficial. 7/8; patient called in this morning to say that his total contact cast was rubbing against his foot. He states he is doing fine overall. He denies signs of infection. 7/12; continued deterioration in the wound over the right fifth metatarsal head crumbling bone. This is not going to be salvageable. The patient agrees and wants to be referred to Dr. Doran Durand which we will attempt to arrange as soon as possible. I am going to continue him on antibiotics as long as that takes so I will renew those today. The area on the left foot which is the base of the fifth metatarsal continues to look somewhat better. Healthy looking tissue no depth no debridement is necessary here. 7/20; the patient was kindly  seen by Dr. Doran Durand of orthopedics on 10/19/2020. 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 Towards this end we have taken him out of the total contact cast on the left we will put him in a wrap with Hydrofera Blue. As I understand things surgery is planned for 7/21 Electronic Signature(s) Signed: 10/21/2020 4:58:30 PM By: [name] MD Entered By: [name] on 10/21/2020 14:15:29 -------------------------------------------------------------------------------- Physical Exam Details Patient Name: Date of Service: [name], [name] 10/21/2020  1:00 PM Medical Record Number: [id number] Patient Account Number: [id number] Date of Birth/Sex: Treating RN: September 28, 1985 (35 y.o. [name] Primary Care Provider: Seward [name] Other Clinician: Referring Provider: Treating Provider/Extender: [name] Read in Treatment: 40 Constitutional Patient is hypertensive.. Pulse regular and within target range for patient.Marland Kitchen Respirations regular, non-labored and within target range.. Temperature is normal and within the target range for the patient.Marland Kitchen Appears in no distress. Notes Wound exam; at the base of the left fifth metatarsal wound is clean with healthy granulation minimal decrease in width. The right fifth metatarsal wound again has necrotic bone. She has the area on the right dorsal ankle which is smaller as well Electronic Signature(s) Signed: 10/21/2020 4:58:30 PM By: [name] MD Entered By: [name] on 10/21/2020 14:16:33 -------------------------------------------------------------------------------- Physician Orders Details Patient Name: Date of Service: [name], [name] 10/21/2020 1:00 PM Medical Record Number: [id number] Patient Account Number: [id number] Date of Birth/Sex: Treating RN: 01/05/1986 (35 y.o. 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 - with Dr. Dellia Nims Return A Bathing/ Shower/ Hygiene May shower with protection but do not get wound dressing(s) wet. - use a cast protector to left leg. Edema Control - Lymphedema / SCD / Other Bilateral Lower Extremities Elevate legs to the level of the heart or above for 30 minutes daily and/or when sitting, a frequency of: - throughout the day Avoid standing for long periods of time. Exercise regularly Moisturize legs daily. - right leg every night before bed. Compression stocking or Garment 20-30 mm/Hg pressure to: - Apply to right leg in the morning and remove at night. Off-Loading Open toe surgical shoe to: - right foot Wound Treatment Wound #3 - Foot Wound Laterality: Left, Lateral Cleanser: Soap and Water 1 x Per Week/30 Days Discharge Instructions: May shower and wash wound with dial antibacterial soap and water prior to dressing change. Peri-Wound Care: Zinc Oxide Ointment 30g tube 1 x Per Week/30 Days Discharge Instructions: Apply Zinc Oxide to periwound with each dressing change as needed. Prim Dressing: Hydrofera Blue Classic Foam, 2x2 in 1 x Per Week/30 Days ary Discharge Instructions: Moisten with saline prior to applying to wound bed Secondary Dressing: Woven Gauze Sponge, Non-Sterile 4x4 in 1 x Per Week/30 Days Discharge Instructions: Apply over primary dressing as directed. Secondary Dressing: ABD Pad,  8x10 1 x Per Week/30 Days Discharge Instructions: T down before applying kerlix. Apply over primary dressing as directed. ape Secondary Dressing: Zetuvit Plus 4x8 in 1 x Per Week/30 Days Discharge Instructions: Apply over primary dressing as directed. Compression Wrap: Kerlix Roll 4.5x3.1 (in/yd) 1 x Per Week/30 Days Discharge Instructions: Apply Kerlix and Coban compression under TCC. Compression Wrap: Coban Self-Adherent Wrap 4x5 (in/yd) 1 x Per Week/30 Days Discharge Instructions: Apply over Kerlix as directed. Wound #5 - Metatarsal head fifth Wound Laterality: Right, Lateral Cleanser: Normal Saline (Generic) Every Other Day/15 Days Discharge Instructions: Cleanse the wound with Normal Saline prior to applying a clean dressing using gauze sponges, not tissue or cotton balls. Cleanser: Soap and Water  Every Other Day/15 Days Discharge Instructions: May shower and wash wound with dial antibacterial soap and water prior to dressing change. Prim Dressing: KerraCel Ag Gelling Fiber Dressing, 2x2 in (silver alginate) (DME) (Generic) Every Other Day/15 Days ary Discharge Instructions: Apply silver alginate to wound bed as instructed Secondary Dressing: ABD Pad, 5x9 (DME) (Generic) Every Other Day/15 Days Discharge Instructions: Apply over primary dressing as directed. Secondary Dressing: Optifoam Non-Adhesive Dressing, 4x4 in (DME) (Generic) Every Other Day/15 Days Discharge Instructions: Apply over primary dressing, cut to make foam donut to offload Secured With: Kerlix Roll Sterile, 4.5x3.1 (in/yd) (DME) (Generic) Every Other Day/15 Days Discharge Instructions: Secure with Kerlix as directed. Secured With: 64M Medipore H Soft Cloth Surgical Tape, 2x2 (in/yd) (DME) (Generic) Every Other Day/15 Days Discharge Instructions: Secure dressing with tape as directed. Wound #7 - Ankle Wound Laterality: Right, Anterior Cleanser: Normal Saline (Generic) Every Other Day/15 Days Discharge Instructions:  Cleanse the wound with Normal Saline prior to applying a clean dressing using gauze sponges, not tissue or cotton balls. Cleanser: Soap and Water Every Other Day/15 Days Discharge Instructions: May shower and wash wound with dial antibacterial soap and water prior to dressing change. Prim Dressing: KerraCel Ag Gelling Fiber Dressing, 2x2 in (silver alginate) (DME) (Generic) Every Other Day/15 Days ary Discharge Instructions: Apply silver alginate to wound bed as instructed Secondary Dressing: Woven Gauze Sponge, Non-Sterile 4x4 in (DME) (Generic) Every Other Day/15 Days Discharge Instructions: Apply over primary dressing as directed. Secondary Dressing: ABD Pad, 5x9 (DME) (Generic) Every Other Day/15 Days Discharge Instructions: Apply over primary dressing as directed. Secured With: [employer], 4.5x3.1 (in/yd) (DME) (Generic) Every Other Day/15 Days Discharge Instructions: Secure with Kerlix as directed. Secured With: 64M Medipore H Soft Cloth Surgical Tape, 2x2 (in/yd) (DME) (Generic) Every Other Day/15 Days Discharge Instructions: Secure dressing with tape as directed. Electronic Signature(s) Signed: 10/22/2020 5:24:41 PM By: [name] MD Signed: 10/26/2020 4:44:07 PM By: [name] RN, BSN Previous Signature: 10/21/2020 4:58:30 PM Version By: [name] MD Entered By: [name] on 10/21/2020 18:38:07 -------------------------------------------------------------------------------- Problem List Details Patient Name: Date of Service: [name], [name] 10/21/2020 1:00 PM Medical Record Number: [id number] Patient Account Number: [id number] Date of Birth/Sex: Treating RN: 1986-02-19 (35 y.o. 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 08/10/2020 No Yes Inactive Problems ICD-10 Code Description Active Date Inactive Date L97.518 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ity 10/24/2019 10/24/2019 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, right ankle and foot 10/24/2019 10/24/2019 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, left ankle and foot 10/24/2019 10/24/2019 B95.62 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 10/24/2019 10/24/2019 classified elsewhere Resolved Problems Electronic Signature(s) Signed: 10/21/2020 4:58:30 PM By: [name] MD Entered By: [name] on 10/21/2020 14:12:38 -------------------------------------------------------------------------------- Progress Note Details Patient Name: Date of Service: [name], [name] 10/21/2020 1:00 PM Medical Record Number: [id number] Patient Account Number: [id number] Date of Birth/Sex: Treating RN: Oct 12, 1985 (35 y.o. 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 He comes in for review of a wound over the plantar fifth metatarsal head extending into the lateral part of the foot. He was followed for this previously by his podiatrist Dr. Cornelius [name]. As the patient tells his story he went to see podiatry first for a swelling he developed on the lateral part of his fifth  metatarsal head in May. He states this was "open" by podiatry and the area closed. He was followed up in June and it was again opened callus removed and it closed promptly. There were plans being made for surgery on the fifth metatarsal head in June however his blood sugar was apparently too high for anesthesia. Apparently the area was debrided and opened again in June and it is never closed since. Looking over the records from podiatry I am really not able to follow this. It was clear when he was first seen it was before 5/14 at that point he already had a wound. By 5/17 the ulcer was resolved. I do not see anything about a procedure. On 5/28 noted to have pre-ulcerative moderate keratosis. X-ray noted 1/5 contracted toe and tailor's bunion and metatarsal deformity. On a visit date on 09/28/2018 the dorsal part of the left foot it healed and resolved. There was concern about swelling in his lower extremity he was sent to the ER.. As far as I can tell he was seen in the ER on 7/12 with an ulcer on his left foot. A DVT rule out of the left leg was negative. I do not think I have complete records from podiatry but I am not able to verify the procedures this patient states he had. He states after the last procedure the wound has never closed although I am not able to follow this in the records I have from podiatry. He has not had a recent x-ray The patient has been using Neosporin on the wound. He is wearing a Darco shoe. He is still very active up on his foot working and exercising. Past medical history; type 2 diabetes ketosis-prone, leg swelling with a negative DVT study in July. Non-smoker ABI in our clinic was 0.85 on the left 10/16; substantial wound on the plantar left fifth met head extending laterally almost to the dorsal fifth MTP. We have been using silver alginate we gave him a Darco forefoot off loader. An x-ray did not show evidence of osteomyelitis did note soft tissue emphysema which I  think was due to gas tracking through an open wound. There is no doubt in my mind he requires an MRI 10/23; MRI not booked until 3 November at the earliest this is largely due to his glucose sensor in the right arm. We have been using silver alginate. There has been an improvement 10/29; I am still not exactly sure when his MRI is booked for. He says it is the third but it is the 10th in epic. This definitely needs to be done. He is running a low-grade fever today but no other symptoms. No real improvement in the 1 02/26/2019 patient presents today for a follow-up visit here in our clinic he is last been seen in the clinic on October 29. Subsequently we were working on getting MRI to evaluate and see what exactly was going on and where we would  need to go from the standpoint of whether or not he had osteomyelitis and again what treatments were going be required. Subsequently the patient ended up being admitted to the hospital on 02/07/2019 and was discharged on 02/14/2019. This is a somewhat interesting admission with a discharge diagnosis of pneumonia due to COVID-19 although he was positive for COVID-19 when tested at the urgent care but negative x2 when he was actually in the hospital. With that being said he did have acute respiratory failure with hypoxia and it was noted he also have a left foot ulceration with osteomyelitis. With that being said he did require oxygen for his pneumonia and I level 4 L. He was placed on antivirals and steroids for the COVID-19. He was also transferred to the [facility] at one point. Nonetheless he did subsequently discharged home and since being home has done much better in that regard. The CT angiogram did not show any pulmonary embolism. With regard to the osteomyelitis the patient was placed on vancomycin and Zosyn while in the hospital but has been changed to Augmentin at discharge. It was also recommended that he follow- up with wound care and  podiatry. Podiatry however wanted him to see [name] according to the patient prior to them doing anything further. His hemoglobin A1c was 9.9 as noted in the hospital. Have an MRI of the left foot performed while in the hospital on 02/04/2019. 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 There is an overlying plantar open wound noted an abscess tracking back along the lateral aspect of the fifth metatarsal shaft. There is otherwise diffuse cellulitis and mild fasciitis without findings of polymyositis. The patient did have recently pneumonia secondary to COVID-19 I looked in the chart through epic and it does appear that the patient may need to have an additional x-ray just to ensure everything is cleared and that he has no airspace disease prior to putting him into the [name]. 03/05/2019; patient was readmitted to the clinic last week. He was hospitalized twice for a viral upper respiratory tract infection from 11/1 through 11/4 and then 11/5 through 11/12 ultimately this turned out to be Covid pneumonitis. Although he was discharged on oxygen he is not using it. He says he feels fine. He has no exercise limitation no cough no sputum. His O2 sat in our clinic today was 100% on room air. 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 He received Vanco and Zosyn in the hospital and then was discharged on 2 weeks of Augmentin. I do not see any relevant cultures. He was supposed to follow-up with infectious disease but I do not see that he has an appointment. 12/8; patient saw Dr. Novella Olive of infectious disease last week. He felt that he had had adequate antibiotic therapy. He did not go to follow-up with Dr. Amalia Hailey of podiatry and I have again talked to him about the pros and cons of this. He does not want to  consider a ray amputation of this time. He is aware of the risks of recurrence, migration etc. He started HBO today and tolerated this well. He can complete the Augmentin that I gave him last week. I have looked over the lab work that Dr. Chana Bode ordered his C-reactive protein was 3.3 and his sedimentation rate was 17. The C-reactive  protein is never really been measurably that high in this patient 12/15; not much change in the wound today however he has undermining along the lateral part of the foot again more extensively than last week. He has some rims of epithelialization. We have been using silver alginate. He is undergoing hyperbarics but did not dive today 12/18; in for his obligatory first total contact cast change. Unfortunately there was pus coming from the undermining area around his fifth metatarsal head. This was cultured but will preclude reapplication of a cast. He is seen in conjunction with HBO 12/24; patient had staph lugdunensis in the wound in the undermining area laterally last time. We put him on doxycycline which should have covered this. The wound looks better today. I am going to give him another week of doxycycline before reattempting the total contact cast 12/31; the patient is completing antibiotics. Hemorrhagic debris in the distal part of the wound with some undermining distally. He also had hyper granulation. Extensive debridement with a #5 curette. The infected area that was on the lateral part of the fifth met head is closed over. I do not think he needs any more antibiotics. Patient was seen prior to HBO. Preparations for a total contact cast were made in the cast will be placed post hyperbarics 04/11/19; once again the patient arrives today without complaint. He had been in a cast all week noted that he had heavy drainage this week. This resulted in large raised areas of macerated tissue around the wound 1/14; wound bed looks better slightly smaller. Hydrofera Blue has  been changing himself. He had a heavy drainage last week which caused a lot of maceration around the wound so I took him out of a total contact cast he says the drainage is actually better this week He is seen today in conjunction with HBO 1/21; returns to clinic. He was up in [place] for a day or 2 attending a funeral. He comes back in with the wound larger and with a large area of exposed bone. He had osteomyelitis and septic arthritis of the fifth left metatarsal head while he was in hospital. He received IV antibiotics in the hospital for a prolonged period of time then 3 weeks of Augmentin. Subsequently I gave him 2 weeks of doxycycline for more superficial wound infection. When I saw this last week the wound was smaller the surface of the wound looks satisfactory. 1/28; patient missed hyperbarics today. Bone biopsy I did last time showed Enterococcus faecalis and Staphylococcus lugdunensis . He has a wide area of exposed bone. We are going to use silver alginate as of today. I had another ethical discussion with the patient. 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 In this situation I think the likelihood of healing this is low. Therefore I have recommended a ray amputation and with the patient's agreement I have referred him to Dr. Doran Durand. The other issue is that his compliance with hyperbarics has been minimal because of his work schedule and given his underlying decision I am going to stop this today READMISSION 10/24/2019 MRI 09/29/2019 left foot IMPRESSION: 1. Apparent skin ulceration inferior and lateral to the 5th metatarsal base with underlying heterogeneous T2 signal and enhancement in the subcutaneous fat. Small peripherally enhancing fluid collections along the plantar and lateral aspects of the 5th metatarsal base suspicious for abscesses. 2. Interval amputation through the mid 5th metatarsal with nonspecific low-level marrow edema and  enhancement. Given the proximity to the adjacent  soft tissue inflammatory changes, osteomyelitis cannot be excluded. 3. The additional bones appear unremarkable. MRI 09/29/2019 right foot IMPRESSION: 1. Soft tissue ulceration lateral to the 5th MTP joint. There is low-level T2 hyperintensity within the 4th and 5th metatarsal heads and adjacent proximal phalanges without abnormal T1 signal or cortical destruction. These findings are nonspecific and could be seen with early marrow edema, hyperemia or early osteomyelitis. No evidence of septic joint. 2. Mild tenosynovitis and synovial enhancement associated with the extensor digitorum tendons at the level of the midfoot. 3. Diffuse low-level muscular T2 hyperintensity and enhancement, most consistent with diabetic myopathy. LEFT FOOT BONE Methicillin resistant staphylococcus aureus Staphylococcus lugdunensis MIC MIC CIPROFLOXACIN >=8 RESISTANT Resistant <=0.5 SENSI... Sensitive CLINDAMYCIN <=0.25 SENS... Sensitive >=8 RESISTANT Resistant ERYTHROMYCIN >=8 RESISTANT Resistant >=8 RESISTANT Resistant GENTAMICIN <=0.5 SENSI... Sensitive <=0.5 SENSI... Sensitive Inducible Clindamycin NEGATIVE Sensitive NEGATIVE Sensitive OXACILLIN >=4 RESISTANT Resistant 2 SENSITIVE Sensitive RIFAMPIN <=0.5 SENSI... Sensitive <=0.5 SENSI... Sensitive TETRACYCLINE <=1 SENSITIVE Sensitive <=1 SENSITIVE Sensitive TRIMETH/SULFA <=10 SENSIT Sensitive <=10 SENSIT Sensitive ... Marland Kitchen.. VANCOMYCIN 1 SENSITIVE Sensitive <=0.5 SENSI... Sensitive Right foot bone . Component 3 wk ago Specimen Description BONE Special Requests RIGHT 4 METATARSAL SAMPLE B Gram Stain NO WBC SEEN NO ORGANISMS SEEN Culture RARE METHICILLIN RESISTANT STAPHYLOCOCCUS AUREUS NO ANAEROBES ISOLATED Performed at [facility], [place] [address], [place], [place] [address] Report Status 10/08/2019 FINAL Organism ID, Bacteria METHICILLIN RESISTANT STAPHYLOCOCCUS AUREUS Resulting Agency [facility] Susceptibility Methicillin resistant staphylococcus aureus MIC CIPROFLOXACIN >=8 RESISTANT Resistant CLINDAMYCIN <=0.25 SENS... Sensitive ERYTHROMYCIN >=8 RESISTANT Resistant GENTAMICIN <=0.5 SENSI... Sensitive Inducible Clindamycin NEGATIVE Sensitive OXACILLIN >=4 RESISTANT Resistant RIFAMPIN <=0.5 SENSI... Sensitive TETRACYCLINE <=1 SENSITIVE Sensitive TRIMETH/SULFA <=10 SENSIT Sensitive ... VANCOMYCIN 1 SENSITIVE Sensitive This is a patient we had in clinic earlier this year with a wound over his left fifth metatarsal head. 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 Doran Durand on 05/16/2019. He seems to have developed problems with wounds on his bilateral feet in June 2021 although he may have had problems earlier than this. He was in an urgent care with a right foot ulcer on 09/26/2019 and given a course of doxycycline. This was apparently after having trouble getting into see orthopedics. He was seen by podiatry on 09/28/2019 noted to have bilateral lower extremity ulcers including the left lateral fifth metatarsal base and the right subfifth met head. It was noted that had purulent drainage at that time. He required hospitalization from 6/20 through 7/2. This was because of worsening right foot wounds. He underwent bilateral operative incision and drainage and bone biopsies bilaterally. Culture results are listed above. He has been referred back to clinic by Dr. Jacqualyn Posey of podiatry. He is also followed by Dr. Megan Salon who saw him yesterday. He was discharged from hospital on Zyvox Flagyl and Levaquin and yesterday changed to doxycycline Flagyl and Levaquin. His inflammatory markers on 6/26 showed a  sedimentation rate of 129 and a C-reactive protein of 5. This is improved to 14 and 1.3 respectively. This would indicate improvement. ABIs in our clinic today were 1.23 on the right and 1.20 on the left 11/01/2019 on evaluation today patient appears to be doing fairly well in regard to the wounds on his feet at this  point. Fortunately there is no signs of active infection at this time. No fevers, chills, nausea, vomiting, or diarrhea. He currently is seeing infectious disease and still under their care at this point. Subsequently he also has both wounds which she has not been using collagen on as he did not receive that in his packaging he did not call us and let us know that. Apparently that just was missed on the order. Nonetheless we will get that straightened out today. 8/9-Patient returns for bilateral foot wounds, using Prisma with hydrogel moistened dressings, and the wounds appear stable. Patient using surgical shoes, avoiding much pressure or weightbearing as much as possible 8/16; patient has bilateral foot wounds. 1 on the right lateral foot proximally the other is on the left mid lateral foot. Both required debridement of callus and thick skin around the wounds. We have been using silver collagen 8/27; patient has bilateral lateral foot wounds. The area on the left substantially surrounded by callus and dry skin. This was removed from the wound edge. The underlying wound is small. The area on the right measured somewhat smaller today. 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 Unfortunately I did not update his antibiotics during today's visit. 9/10 I reviewed Dr. Hale Bogus last notes he felt he had completed antibiotics his inflammatory markers were reasonably well controlled. He has a small wound on the lateral left foot and a tiny area on the right which is just above closed. He is using Hydrofera Blue with border foam  he has bilateral surgical shoes 9/24; 2 week f/u. doing well. right foot is closed. left foot still undermined. 10/14; right foot remains closed at the fifth met head. The area over the base of the left fifth metatarsal has a small open area but considerable undermining towards the plantar foot. Thick callus skin around this suggests an adequate pressure relief. We have talked about this. He says he is going to go back into his cam boot. I suggested a total contact cast he did not seem enamored with this suggestion 10/26; left foot base of the fifth metatarsal. Same condition as last time. He has skin over the area with an open wound however the skin is not adherent. He went to see Dr. Earleen Newport who did an x-ray and culture of his foot I have not reviewed the x-ray but the patient was not told anything. He is on doxycycline 11/11; since the patient was last here he was in the emergency room on 10/30 he was concerned about swelling in the left foot. They did not do any cultures or x-rays. They changed his antibiotics to cephalexin. Previous culture showed group B strep. The cephalexin is appropriate as doxycycline has less than predictable coverage. Arrives in clinic today with swelling over this area under the wound. He also has a new wound on the right fifth metatarsal head 11/18; the patient has a difficult wound on the lateral aspect of the left fifth metatarsal head. The wound was almost ballotable last week I opened it slightly expecting to see purulence however there was just bleeding. I cultured this this was negative. X-ray unchanged. We are trying to get an MRI but I am not sure were going to be able to get this through his insurance. He also has an area on the right lateral fifth metatarsal head this looks healthier 12/3; the patient finally got our MRI. Surprisingly this did not show osteomyelitis. I did show  the soft tissue ulceration at the lateral plantar aspect of the fifth metatarsal base  with a tiny residual 6 mm abscess overlying the superficial fascia I have tried to culture this area I have not been able to get this to grow anything. Nevertheless the protruding tissue looks aggravated. I suspect we should try to treat the underlying "abscess with broad-spectrum antibiotics. I am going to start him on Levaquin and Flagyl. He has much less edema in his legs and I am going to continue to wrap his legs and see him weekly 12/10. I started Levaquin and Flagyl on him last week. He just picked up the Flagyl apparently there was some delay. The worry is the wound on the left fifth metatarsal base which is substantial and worsening. His foot looks like he inverts at the ankle making this a weightbearing surface. Certainly no improvement in fact I think the measurements of this are somewhat worse. We have been using 12/17; he apparently just got the Levaquin yesterday this is 2 weeks after the fact. He has completed the Flagyl. The area over the left fifth metatarsal base still has protruding granulation tissue although it does not look quite as bad as it did some weeks ago. He has severe bilateral lymphedema although we have not been treating him for wounds on his legs this is definitely going to require compression. There was so much edema in the left I did not wish to put him in a total contact cast today. I am going to increase his compression from 3-4 layer. The area on the right lateral fifth met head actually look quite good and superficial. 12/23; patient arrived with callus on the right fifth met head and the substantial hyper granulated callused wound on the base of his fifth metatarsal. He says he is completing his Levaquin in 2 days but I do not think that adds up with what I gave him but I will have to double check this. We are using Hydrofera Blue on both areas. My plan is to put the left leg in a cast the week after New Year's 04/06/2020; patient's wounds about the same. Right  lateral fifth metatarsal head and left lateral foot over the base of the fifth metatarsal. There is undermining on the left lateral foot which I removed before application of total contact cast continuing with Hydrofera Blue new. Patient tells me he was seen by endocrinology today lab work was done [Dr. Kerr]. Also wondering whether he was referred to cardiology. I went over some lab work from previously does not have chronic renal failure certainly not nephrotic range proteinuria he does have very poorly controlled diabetes but this is not his most updated lab work. Hemoglobin A1c has been over 11 1/10; the patient had a considerable amount of leakage towards mid part of his left foot with macerated skin however the wound surface looks better the area on the right lateral fifth met head is better as well. I am going to change the dressing on the left foot under the total contact cast to silver alginate, continue with Hydrofera Blue on the right. 1/20; patient was in the total contact cast for 10 days. Considerable amount of drainage although the skin around the wound does not look too bad on the left foot. The area on the right fifth metatarsal head is closed. Our nursing staff reports large amount of drainage out of the left lateral foot wound 1/25; continues with copious amounts of drainage described by our intake  staff. PCR culture I did last week showed E. coli and Enterococcus faecalis and low quantities. Multiple resistance genes documented including extended spectrum beta lactamase, MRSA, MRSE, quinolone, tetracycline. The wound is not quite as good this week as it was 5 days ago but about the same size 2/3; continues with copious amounts of malodorous drainage per our intake nurse. The PCR culture I did 2 weeks ago showed E. coli and low quantities of Enterococcus. There were multiple resistance genes detected. I put Neosporin on him last week although this does not seem to have helped. The  wound is slightly deeper today. Offloading continues to be an issue here although with the amount of drainage she has a total contact cast is just not going to work 2/10; moderate amount of drainage. Patient reports he cannot get his stocking on over the dressing. I told him we have to do that the nurse gave him suggestions on how to make this work. The wound is on the bottom and lateral part of his left foot. Is cultured predominantly grew low amounts of Enterococcus, E. coli and anaerobes. There were multiple resistance genes detected including extended spectrum beta lactamase, quinolone, tetracycline. I could not think of an easy oral combination to address this so for now I am going to do topical antibiotics provided by [facility] I think the main agents here are vancomycin and an aminoglycoside. We have to be able to give him access to the wounds to get the topical antibiotic on 2/17; moderate amount of drainage this is unchanged. He has his Keystone topical antibiotic against the deep tissue culture organisms. He has been using this and changing the dressing daily. Silver alginate on the wound surface. 2/24; using Keystone antibiotic with silver alginate on the top. He had too much drainage for a total contact cast at one point although I think that is improving and I think in the next week or 2 it might be possible to replace a total contact cast I did not do this today. In general the wound surface looks healthy however he continues to have thick rims of skin and subcutaneous tissue around the wide area of the circumference which I debrided 06/04/2020 upon evaluation today patient appears to be doing well in regard to his wound. I do feel like he is showing signs of improvement. There is little bit of callus and dead tissue around the edges of the wound as well as what appears to be a little bit of a sinus tract that is off to the side laterally I would perform debridement to clear that away  today. 3/17; left lateral foot. The wound looks about the same as I remember. Not much depth surface looks healthy. No evidence of infection 3/25; left lateral foot. Wound surface looks about the same. Separating epithelium from the circumference. There really is no evidence of infection here however not making progress by my view 3/29; left lateral foot. Surface of the wound again looks reasonably healthy still thick skin and subcutaneous tissue around the wound margins. There is no evidence of infection. One of the concerns being brought up by the nurses has again the amount of drainage vis--vis continued use of a total contact cast 4/5; left lateral foot at roughly the base of the fifth metatarsal. Nice healthy looking granulated tissue with rims of epithelialization. The overall wound measurements are not any better but the tissue looks healthy. The only concern is the amount of drainage although he has no surrounding maceration  with what we have been doing recently to absorb fluid and protect his skin. He also has lymphedema. 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 He states that the kerlix/coban under the TCC rolled down and could not get it back up. He has been using an offloading scooter and has somehow hurt his right foot using this device. This happened last week. He states that the side of his right foot developed a blister and opened. The top of his foot also has a few small open wounds he thinks is due to his socks rubbing in his shoes. He has not been using any dressings to the wound. He denies purulent drainage, fever/chills or erythema to the wounds. 4/22; patient presents for 1 week follow-up. He developed new wounds to the right foot  that were evaluated at last clinic visit. He continues to have a total contact cast to the left leg and he reports no issues. He has been using silver collagen to the right foot wounds with no issues. He denies purulent drainage, fever/chills or erythema to the right foot wounds. He has no complaints today 4/25; patient presents for 1 week follow-up. He has a total contact cast of the left leg and reports no issues. He has been using silver alginate to the right foot wound. He denies purulent drainage, fever/chills or erythema to the right foot wounds. 5/2 patient presents for 1 week follow-up. T contact cast on the left. The wound which is on the base of the plantar foot at the base of the fifth metatarsal otal actually looks quite good and dimensions continue to gradually contract. HOWEVER the area on the right lateral fifth metatarsal head is much larger than what I remember from 2 weeks ago. Once more is he has significant levels of hypergranulation. Noteworthy that he had this same hyper granulated response on his wound on the left foot at one point in time. So much so that he I thought there was an underlying fluid collection. Based on this I think this just needs debridement. 5/9; the wound on the left actually continues to be gradually smaller with a healthy surface. Slight amount of drainage and maceration of the skin around but not too bad. However he has a large wound over the right fifth metatarsal head very much in the same configuration as his left foot wound was initially. I used silver nitrate to address the hyper granulated tissue no mechanical debridement 5/16; area on the left foot did not look as healthy this week deeper thick surrounding macerated skin and subcutaneous tissue. oo The area on the right foot fifth met head was about the same oo The area on the right ankle that we identified last week is completely broken down into an open wound presumably a stocking rubbing  issue 5/23; patient has been using a total contact cast to the left side. He has been using silver alginate underneath. He has also been using silver alginate to the right foot wounds. He has no complaints today. He denies any signs of infection. 5/31; the left-sided wound looks some better measure smaller surface granulation looks better. We have been using silver alginate under the total contact cast oo The large area on his right fifth  met head and right dorsal foot look about the same still using silver alginate 6/6; neither side is good as I was hoping although the surface area dimensions are better. A lot of maceration on his left and right foot around the wound edge. Area on the dorsal right foot looks better. He says he was traveling. I am not sure what does the amount of maceration around the plantar wounds may be drainage issues 6/13; in general the wound surfaces look quite good on both sides. Macerated skin and raised edges around the wound required debridement although in general especially on the left the surface area seems improved. oo The area on the right dorsal ankle is about the same I thought this would not be such a problem to close 6/20; not much change in either wound although the one on the right looks a little better. Both wounds have thick macerated edges to the skin requiring debridements. We have been using silver alginate. The area on the dorsal right ankle is still open I thought this would be closed. 6/28; patient comes in today with a marked deterioration in the right foot wound fifth met head. Wide area of exposed bone this is a drastic change from last time. The area on the left there we have been casting is stagnant. We have been using silver alginate in both wound areas. 7/5; bone culture I did for PCR last time was positive for Pseudomonas, group B strep, Enterococcus and Staph aureus. There was no suggestion of methicillin resistance or ampicillin resistant  genes. This was resistant to tetracycline however He comes into the clinic today with the area over his right plantar fifth metatarsal head which had been doing so well 2 weeks ago completely necrotic feeling bone. I do not know that this is going to be salvageable. The left foot wound is certainly no smaller but it has a better surface and is superficial. 7/8; patient called in this morning to say that his total contact cast was rubbing against his foot. He states he is doing fine overall. He denies signs of infection. 7/12; continued deterioration in the wound over the right fifth metatarsal head crumbling bone. This is not going to be salvageable. The patient agrees and wants to be referred to Dr. Doran Durand which we will attempt to arrange as soon as possible. I am going to continue him on antibiotics as long as that takes so I will renew those today. The area on the left foot which is the base of the fifth metatarsal continues to look somewhat better. Healthy looking tissue no depth no debridement is necessary here. 7/20; the patient was kindly seen by Dr. Doran Durand of orthopedics on 10/19/2020. 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 Towards this end we have taken him out of the total contact cast on the left we will put him in a wrap with Hydrofera Blue. As I understand things surgery is planned for 7/21 Objective Constitutional Patient is hypertensive.. Pulse regular and within target range for patient.Marland Kitchen Respirations regular, non-labored and within target range.. Temperature is normal and within the target range for the patient.Marland Kitchen Appears in no distress. Vitals Time Taken: 1:18 PM, Height: 77 in, Weight: 280 lbs, BMI: 33.2, Temperature: 98.5 F, Pulse: 89 bpm, Respiratory Rate: 20 breaths/min, Blood Pressure: 141/93 mmHg, Capillary Blood Glucose: 130 mg/dl. General Notes: Wound exam; at the base of  the left fifth metatarsal wound is clean with healthy granulation minimal decrease in width. oo The right fifth metatarsal wound again has necrotic bone. oo She has the area on the right dorsal ankle which is smaller as well Integumentary (Hair, Skin) Wound #3 status is Open. Original cause of wound was Trauma. The date acquired was: 10/02/2019. The wound has been in treatment 51 weeks. The wound is located on the Left,Lateral Foot. The wound measures 2.5cm length x 2cm width x 0.3cm depth; 3.927cm^2 area and 1.178cm^3 volume. There is Fat Layer (Subcutaneous Tissue) exposed. There is no tunneling or undermining noted. There is a large amount of serosanguineous drainage noted. The wound margin is thickened. There is large (67-100%) pink granulation within the wound bed. There is no necrotic tissue within the wound bed. Wound #5 status is Open. Original cause of wound was Gradually Appeared. The date acquired was: 07/07/2020. The wound has been in treatment 14 weeks. The wound is located on the Right,Lateral Metatarsal head fifth. The wound measures 1.3cm length x 1.4cm width x 1.3cm depth; 1.429cm^2 area and 1.858cm^3 volume. There is bone and Fat Layer (Subcutaneous Tissue) exposed. There is no tunneling or undermining noted. There is a medium amount of serosanguineous drainage noted. The wound margin is distinct with the outline attached to the wound base. There is medium (34-66%) pink granulation within the wound bed. There is a medium (34-66%) amount of necrotic tissue within the wound bed including Adherent Slough. Wound #7 status is Open. Original cause of wound was Gradually Appeared. The date acquired was: 08/17/2020. The wound has been in treatment 9 weeks. The wound is located on the Right,Anterior Ankle. The wound measures 0.5cm length x 0.5cm width x 0.1cm depth; 0.196cm^2 area and 0.02cm^3 volume. There is Fat Layer (Subcutaneous Tissue) exposed. There is no tunneling or undermining noted.  There is a medium amount of serosanguineous drainage noted. The wound margin is distinct with the outline attached to the wound base. There is large (67-100%) red granulation within the wound bed. There is no necrotic tissue within the wound bed. 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 - with Dr. Arcola Jansky Shower/ Hygiene: May shower with protection but do not get wound dressing(s) wet. - use a cast protector to left leg. Edema Control - Lymphedema / SCD / Other: Elevate legs to the level of the heart or above for 30 minutes daily and/or when sitting, a frequency of: - throughout the day Avoid standing for long periods of time. Exercise regularly Moisturize legs daily. - right leg every night before bed. Compression stocking or Garment 20-30 mm/Hg pressure to: - Apply to right leg in the morning and remove at night. Off-Loading: Open toe surgical shoe to: - right foot WOUND #3: - Foot Wound Laterality: Left, Lateral Cleanser: Soap and Water 1 x Per Week/30 Days Discharge Instructions: May shower and wash wound with dial antibacterial soap and water prior to dressing change. Peri-Wound Care: Zinc Oxide Ointment 30g tube 1 x Per Week/30 Days Discharge Instructions: Apply Zinc Oxide to periwound with each dressing change as needed. Prim Dressing: Hydrofera Blue Classic Foam, 2x2 in 1 x Per Week/30 Days ary Discharge Instructions: Moisten with saline prior to applying to  wound bed Secondary Dressing: Woven Gauze Sponge, Non-Sterile 4x4 in 1 x Per Week/30 Days Discharge Instructions: Apply over primary dressing as directed. Secondary Dressing: ABD Pad,  8x10 1 x Per Week/30 Days Discharge Instructions: T down before applying kerlix. Apply over primary dressing as directed. ape Secondary Dressing: Zetuvit Plus 4x8 in 1 x Per Week/30 Days Discharge Instructions: Apply over primary dressing as directed. Com pression Wrap: Kerlix Roll 4.5x3.1 (in/yd) 1 x Per Week/30 Days Discharge Instructions: Apply Kerlix and Coban compression under TCC. Com pression Wrap: Coban Self-Adherent Wrap 4x5 (in/yd) 1 x Per Week/30 Days Discharge Instructions: Apply over Kerlix as directed. WOUND #5: - Metatarsal head fifth Wound Laterality: Right, Lateral Cleanser: Normal Saline (Generic) Every Other Day/15 Days Discharge Instructions: Cleanse the wound with Normal Saline prior to applying a clean dressing using gauze sponges, not tissue or cotton balls. Cleanser: Soap and Water Every Other Day/15 Days Discharge Instructions: May shower and wash wound with dial antibacterial soap and water prior to dressing change. Prim Dressing: KerraCel Ag Gelling Fiber Dressing, 2x2 in (silver alginate) (Generic) Every Other Day/15 Days ary Discharge Instructions: Apply silver alginate to wound bed as instructed Secondary Dressing: ABD Pad, 5x9 (Generic) Every Other Day/15 Days Discharge Instructions: Apply over primary dressing as directed. Secondary Dressing: Optifoam Non-Adhesive Dressing, 4x4 in (Generic) Every Other Day/15 Days Discharge Instructions: Apply over primary dressing, cut to make foam donut to offload Secured With: Kerlix Roll Sterile, 4.5x3.1 (in/yd) (Generic) Every Other Day/15 Days Discharge Instructions: Secure with Kerlix as directed. Secured With: 4M Medipore H Soft Cloth Surgical T ape, 2x2 (in/yd) (Generic) Every Other Day/15 Days Discharge Instructions: Secure dressing with tape as directed. WOUND #7: - Ankle Wound Laterality: Right, Anterior Cleanser: Normal Saline (Generic) Every Other Day/15 Days Discharge Instructions: Cleanse the wound with  Normal Saline prior to applying a clean dressing using gauze sponges, not tissue or cotton balls. Cleanser: Soap and Water Every Other Day/15 Days Discharge Instructions: May shower and wash wound with dial antibacterial soap and water prior to dressing change. Prim Dressing: KerraCel Ag Gelling Fiber Dressing, 2x2 in (silver alginate) (Generic) Every Other Day/15 Days ary Discharge Instructions: Apply silver alginate to wound bed as instructed Secondary Dressing: Woven Gauze Sponge, Non-Sterile 4x4 in (Generic) Every Other Day/15 Days Discharge Instructions: Apply over primary dressing as directed. Secondary Dressing: ABD Pad, 5x9 (Generic) Every Other Day/15 Days Discharge Instructions: Apply over primary dressing as directed. Secured With: [employer], 4.5x3.1 (in/yd) (Generic) Every Other Day/15 Days Discharge Instructions: Secure with Kerlix as directed. Secured With: 4M Medipore H Soft Cloth Surgical T ape, 2x2 (in/yd) (Generic) Every Other Day/15 Days Discharge Instructions: Secure dressing with tape as directed. 1. Surgery tomorrow. We did not put him in a cast 2. Hydrofera Blue to the left fifth metatarsal base wound with kerlix Coban 3. Hydrofera Blue to the area on the right fifth met head and right dorsal ankle. No doubt these will be changed in the hospital 4. We will make him an appointment for next week however he is to call us if this will not be possible. Electronic Signature(s) Signed: 10/21/2020 4:58:30 PM By: [name] MD Entered By: [name] on 10/21/2020 14:17:36 -------------------------------------------------------------------------------- SuperBill Details Patient Name: Date of Service: [name], [name] 10/21/2020 Medical Record Number: [id number] Patient Account Number: [id number] Date of Birth/Sex: Treating RN: 1985-04-22 (35 y.o. 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 Facility Procedures CPT4 Code: [id number] Description: 99214 - WOUND CARE VISIT-LEV 4 EST PT Modifier: Quantity: 1 Physician Procedures : CPT4 Code Description Modifier [id number] - WC PHYS LEVEL 3 - EST PT ICD-10 Diagnosis Description L97.518 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ity L97.528 Non-pressure chronic ulcer of other part of left  foot with other specified severity E11.621 Type 2 diabetes mellitus with foot ulcer Quantity: 1 Electronic Signature(s) Signed: 10/22/2020 5:24:41 PM By: [name] MD Signed: 10/26/2020 4:44:07 PM By: [name] RN, BSN Previous Signature: 10/21/2020 4:58:30 PM Version By: [name] MD Entered By: [name] on 10/21/2020 18:07:25

## 2020-10-27 NOTE — Progress Notes (Signed)
[name] ([id number]) Visit Report for 10/21/2020 Arrival Information Details Patient Name: Date of Service: [name] 10/21/2020 1:00 PM Medical Record Number: [id number] Patient Account Number: [id number] Date of Birth/Sex: Treating RN: 04/04/1986 (35 y.o. [name] Primary Care [name]: [name] Other Clinician: Referring [name]: Treating [name]/Extender: [name] in Treatment: 27 Visit Information History Since Last Visit Added or deleted any medications: No Patient Arrived: Ambulatory Any new allergies or adverse reactions: No Arrival Time: 13:18 Had Max fall or experienced change in No Accompanied By: self activities of daily living that may affect Transfer Assistance: None risk of falls: Patient Identification Verified: Yes Signs or symptoms of abuse/neglect since last visito No Secondary Verification Process Completed: Yes Hospitalized since last visit: No Patient Requires Transmission-Based Precautions: No Implantable device outside of the clinic excluding No Patient Has Alerts: No cellular tissue based products placed in the center since last visit: Has Dressing in Place as Prescribed: Yes Pain Present Now: No Electronic Signature(s) Signed: 10/23/2020 10:49:45 AM By: [name] Entered By: [name] on 10/21/2020 13:18:42 -------------------------------------------------------------------------------- Clinic Level of Care Assessment Details Patient Name: Date of Service: [name] 10/21/2020 1:00 PM Medical Record Number: [id number] Patient Account Number: [id number] Date of Birth/Sex: Treating RN: [birth year] (35 y.o. [name] Primary Care [name]: [name] Other Clinician: Referring [name]: Treating [name]/Extender: [name] in Treatment: 51 Clinic Level of Care Assessment Items TOOL 4 Quantity Score X- 1 0 Use when only an EandM is  performed on FOLLOW-UP visit ASSESSMENTS - Nursing Assessment / Reassessment X- 1 10 Reassessment of Co-morbidities (includes updates in patient status) X- 1 5 Reassessment of Adherence to Treatment Plan ASSESSMENTS - Wound and Skin Max ssessment / Reassessment _0  - 0 Simple Wound Assessment / Reassessment - one wound X- 3 5 Complex Wound Assessment / Reassessment - multiple wounds _1  - 0 Dermatologic / Skin Assessment (not related to wound area) ASSESSMENTS - Focused Assessment _2  - 0 Circumferential Edema Measurements - multi extremities _3  - 0 Nutritional Assessment / Counseling / Intervention X- 1 5 Lower Extremity Assessment (monofilament, tuning fork, pulses) _4  - 0 Peripheral Arterial Disease Assessment (using hand held doppler) ASSESSMENTS - Ostomy and/or Continence Assessment and Care _5  - 0 Incontinence Assessment and Management _6  - 0 Ostomy Care Assessment and Management (repouching, etc.) PROCESS - Coordination of Care X - Simple Patient / Family Education for ongoing care 1 15 _7  - 0 Complex (extensive) Patient / Family Education for ongoing care X- 1 10 Staff obtains Programmer, systems, Records, T Results / Process Orders est _8  - 0 Staff telephones HHA, Nursing Homes / Clarify orders / etc _9  - 0 Routine Transfer to another Facility (non-emergent condition) _10  - 0 Routine Hospital Admission (non-emergent condition) _11  - 0 New Admissions / Biomedical engineer / Ordering NPWT Apligraf, etc. , _12  - 0 Emergency Hospital Admission (emergent condition) X- 1 10 Simple Discharge Coordination _13  - 0 Complex (extensive) Discharge Coordination PROCESS - Special Needs _14  - 0 Pediatric / Minor Patient Management _15  - 0 Isolation Patient Management _16  - 0 Hearing / Language / Visual special needs _17  - 0 Assessment of Community assistance (transportation, [name]/C planning, etc.) _18  - 0 Additional assistance / Altered mentation _19  - 0 Support Surface(s) Assessment  (bed, cushion, seat, etc.) INTERVENTIONS - Wound Cleansing / Measurement _20  - 0 Simple Wound Cleansing - one wound X- 3 5 Complex Wound Cleansing - multiple wounds X- 1 5  Wound Imaging (photographs - any number of wounds) _0  - 0 Wound Tracing (instead of photographs) _1  - 0 Simple Wound Measurement - one wound X- 3 5 Complex Wound Measurement - multiple wounds INTERVENTIONS - Wound Dressings _2  - 0 Small Wound Dressing one or multiple wounds X- 1 15 Medium Wound Dressing one or multiple wounds X- 1 20 Large Wound Dressing one or multiple wounds <[id number]>_1<\/HYQMVHQIONGEXBMW>_4  - 0 Application of Medications - topical <XLKGMWNUUVOZDGUY>_4<\/IHKVQQVZDGLOVFIE>_3  - 0 Application of Medications - injection INTERVENTIONS - Miscellaneous _5  - 0 External ear exam _6  - 0 Specimen Collection (cultures, biopsies, blood, body fluids, etc.) _7  - 0 Specimen(s) / Culture(s) sent or taken to Lab for analysis _8  - 0 Patient Transfer (multiple staff / Civil Service fast streamer / Similar devices) _9  - 0 Simple Staple / Suture removal (25 or less) _10  - 0 Complex Staple / Suture removal (26 or more) _11  - 0 Hypo / Hyperglycemic Management (close monitor of Blood Glucose) _12  - 0 Ankle / Brachial Index (ABI) - do not check if billed separately X- 1 5 Vital Signs Has the patient been seen at the hospital within the last three years: Yes Total Score: 145 Level Of Care: New/Established - Level 4 Electronic Signature(s) Signed: 10/26/2020 4:44:07 PM By: [name] RN, BSN Entered By: [name] on 10/21/2020 18:07:18 -------------------------------------------------------------------------------- Complex / Palliative Patient Assessment Details Patient Name: Date of Service: [name], [name] 10/21/2020 1:00 PM Medical Record Number: [id number] Patient Account Number: [id number] Date of Birth/Sex: Treating RN: 07/20/1985 (35 y.o. [name] Primary Care Livingston Denner: [name] Other Clinician: Referring Christifer Chapdelaine: Treating Elouise Divelbiss/Extender: [name] in Treatment: 51 Palliative Management Criteria Complex Wound Management Criteria Patient has remarkable or complex co-morbidities requiring medications or treatments that extend wound healing times. Examples: Diabetes mellitus with chronic renal failure or end stage renal disease requiring dialysis Advanced or poorly controlled rheumatoid arthritis Diabetes mellitus and end stage chronic obstructive pulmonary disease Active cancer with current chemo- or radiation therapy DM, osteomyelitis, bil foot wounds, needs toe ray amp Care Approach Wound Care Plan: Complex Wound Management Electronic Signature(s) Signed: 10/26/2020 4:44:37 PM By: Baruch Gouty RN, BSN Signed: 10/27/2020 5:43:33 PM By: [name] MD Entered By: Baruch Gouty on 10/26/2020 16:16:17 -------------------------------------------------------------------------------- Encounter Discharge Information Details Patient Name: Date of Service: [name], [name] 10/21/2020 1:00 PM Medical Record Number: [id number] Patient Account Number: [id number] Date of Birth/Sex: Treating RN: [birth year] (35 y.o. [name] Primary Care Ayde Record: [name] Other Clinician: Referring Avett Reineck: Treating Aleia Larocca/Extender: [name] in Treatment: 83 Encounter Discharge Information Items Discharge Condition: Stable Ambulatory Status: Ambulatory Discharge Destination: Home Transportation: Private Auto Accompanied By: self Schedule Follow-up Appointment: Yes Clinical Summary of Care: Patient Declined Electronic Signature(s) Signed: 10/22/2020 7:48:44 PM By: Rhae Hammock RN Entered By: Rhae Hammock on 10/21/2020 16:44:59 -------------------------------------------------------------------------------- Lower Extremity Assessment Details Patient Name: Date of Service: [name], [name] 10/21/2020 1:00 PM Medical Record Number: [id number] Patient Account  Number: [id number] Date of Birth/Sex: Treating RN: 08/11/1985 (35 y.o. [name] Primary Care Mailani Degroote: [name] Other Clinician: Referring Manasi Dishon: Treating Eldena Dede/Extender: [name] in Treatment: 51 Edema Assessment Assessed: [name]: Yes] [name]: Yes] Edema: [Left: Yes] [Right: Yes] Calf Left: Right: Point of Measurement: 48 cm From Medial Instep 50 cm 48 cm Ankle Left: Right: Point of Measurement: 11 cm From Medial Instep 28 cm 29 cm Vascular Assessment Pulses: Dorsalis Pedis Palpable: [Left:Yes] [Right:Yes] Posterior Tibial Palpable: [Left:Yes] [  Right:Yes] Electronic Signature(s) Signed: 10/22/2020 7:48:44 PM By: Rhae Hammock RN Entered By: Rhae Hammock on 10/21/2020 13:48:28 -------------------------------------------------------------------------------- Multi Wound Chart Details Patient Name: Date of Service: [name], [name] 10/21/2020 1:00 PM Medical Record Number: [id number] Patient Account Number: [id number] Date of Birth/Sex: Treating RN: [birth year] (35 y.o. [name] Primary Care Miguel Christiana: [name] Other Clinician: Referring Ahmir Bracken: Treating Shantoria Ellwood/Extender: [name] in Treatment: 51 Vital Signs Height(in): 77 Capillary Blood Glucose(mg/dl): 130 Weight(lbs): 280 Pulse(bpm): 5 Body Mass Index(BMI): 33 Blood Pressure(mmHg): 141/93 Temperature(F): 98.5 Respiratory Rate(breaths/min): 20 Photos: [3:No Photos Left, Lateral Foot] [5:No Photos Right, Lateral Metatarsal head fifth Right, Anterior Ankle] [7:No Photos] Wound Location: [3:Trauma] [5:Gradually Appeared] [7:Gradually Appeared] Wounding Event: [3:Diabetic Wound/Ulcer of the Lower] [5:Diabetic Wound/Ulcer of the Lower] [7:Diabetic Wound/Ulcer of the Lower] Primary Etiology: [3:Extremity Type II Diabetes] [5:Extremity Type II Diabetes] [7:Extremity Type II Diabetes] Comorbid History: [3:10/02/2019]  [5:07/07/2020] [7:08/17/2020] Date Acquired: [3:51] [5:14] [7:9] Weeks of Treatment: [3:Open] [5:Open] [7:Open] Wound Status: [3:2.5x2x0.3] [5:1.3x1.4x1.3] [7:0.5x0.5x0.1] Measurements L x W x [name] [3:3.927] [5:1.429] [7:0.196] Max (cm) : rea [3:1.178] [5:1.858] [7:0.02] Volume (cm) : [3:-138.10%] [5:83.80%] [7:91.40%] % Reduction in Max rea: [3:-613.90%] [5:57.80%] [7:91.20%] % Reduction in Volume: [3:Grade 2] [5:Grade 2] [7:Grade 2] Classification: [3:Large] [5:Medium] [7:Medium] Exudate Max mount: [3:Serosanguineous] [5:Serosanguineous] [7:Serosanguineous] Exudate Type: [3:red, brown] [5:red, brown] [7:red, brown] Exudate Color: [3:Thickened] [5:Distinct, outline attached] [7:Distinct, outline attached] Wound Margin: [3:Large (67-100%)] [5:Medium (34-66%)] [7:Large (67-100%)] Granulation Max mount: [3:Pink] [5:Pink] [7:Red] Granulation Quality: [3:None Present (0%)] [5:Medium (34-66%)] [7:None Present (0%)] Necrotic Max mount: [3:Fat Layer (Subcutaneous Tissue): Yes Fat Layer (Subcutaneous Tissue): Yes Fat Layer (Subcutaneous Tissue): Yes] Exposed Structures: [3:Fascia: No Tendon: No Muscle: No Joint: No Bone: No Small (1-33%)] [5:Bone: Yes Fascia: No Tendon: No Muscle: No Joint: No Small (1-33%)] [7:Fascia: No Tendon: No Muscle: No Joint: No Bone: No Medium (34-66%)] Treatment Notes Electronic Signature(s) Signed: 10/21/2020 4:58:30 PM By: [name] MD Signed: 10/26/2020 4:44:07 PM By: [name] RN, BSN Entered By: [name] on 10/21/2020 14:12:46 -------------------------------------------------------------------------------- Multi-Disciplinary Care Plan Details Patient Name: Date of Service: [name], [name] 10/21/2020 1:00 PM Medical Record Number: [id number] Patient Account Number: [id number] Date of Birth/Sex: Treating RN: 12/24/1985 (35 y.o. [name] Primary Care Ebone Alcivar: [name] Other Clinician: Referring Aracelly Tencza: Treating Tida Saner/Extender:  [name] in Treatment: 66 Multidisciplinary Care Plan reviewed with physician Active Inactive Nutrition Nursing Diagnoses: Imbalanced nutrition Potential for alteratiion in Nutrition/Potential for imbalanced nutrition Goals: Patient/caregiver agrees to and verbalizes understanding of need to use nutritional supplements and/or vitamins as prescribed Date Initiated: 10/24/2019 Date Inactivated: 04/06/2020 Target Resolution Date: 04/03/2020 Goal Status: Met Patient/caregiver will maintain therapeutic glucose control Date Initiated: 10/24/2019 Target Resolution Date: 11/20/2020 Goal Status: Active Interventions: Assess HgA1c results as ordered upon admission and as needed Assess patient nutrition upon admission and as needed per policy Provide education on elevated blood sugars and impact on wound healing Provide education on nutrition Treatment Activities: Education provided on Nutrition : 10/13/2020 Notes: Wound/Skin Impairment Nursing Diagnoses: Impaired tissue integrity Knowledge deficit related to ulceration/compromised skin integrity Goals: Patient/caregiver will verbalize understanding of skin care regimen Date Initiated: 10/24/2019 Target Resolution Date: 11/20/2020 Goal Status: Active Ulcer/skin breakdown will have Max volume reduction of 30% by week 4 Date Initiated: 10/24/2019 Date Inactivated: 01/16/2020 Target Resolution Date: 01/10/2020 Unmet Reason: no change in Goal Status: Unmet measurements. Interventions: Assess patient/caregiver ability to obtain necessary supplies Assess patient/caregiver ability to perform ulcer/skin care  regimen upon admission and as needed Assess ulceration(s) every visit Provide education on ulcer and skin care Notes: Electronic Signature(s) Signed: 10/26/2020 4:44:07 PM By: [name] RN, BSN Entered By: [name] on 10/21/2020  85:88:50 -------------------------------------------------------------------------------- Pain Assessment Details Patient Name: Date of Service: [name], [name] 10/21/2020 1:00 PM Medical Record Number: [id number] Patient Account Number: [id number] Date of Birth/Sex: Treating RN: [birth year] (35 y.o. [name] Primary Care Jerian Morais: [name] Other Clinician: Referring Hale Chalfin: Treating Lumen Brinlee/Extender: [name] in Treatment: 51 Active Problems Location of Pain Severity and Description of Pain Patient Has Paino No Site Locations Pain Management and Medication Current Pain Management: Electronic Signature(s) Signed: 10/23/2020 10:49:45 AM By: [name] Signed: 10/26/2020 4:44:07 PM By: [name] RN, BSN Entered By: [name] on 10/21/2020 13:19:17 -------------------------------------------------------------------------------- Patient/Caregiver Education Details Patient Name: Date of Service: [name] 7/20/2022andnbsp1:00 PM Medical Record Number: [id number] Patient Account Number: [id number] Date of Birth/Gender: Treating RN: February 26, 1986 (35 y.o. [name] Primary Care Physician: [name] Other Clinician: Referring Physician: Treating Physician/Extender: [name] in Treatment: 46 Education Assessment Education Provided To: Patient Education Topics Provided Wound/Skin Impairment: Methods: Explain/Verbal Responses: State content correctly [employer]) Signed: 10/26/2020 4:44:07 PM By: [name] RN, BSN Entered By: [name] on 10/21/2020 18:06:19 -------------------------------------------------------------------------------- Wound Assessment Details Patient Name: Date of Service: [name], [name] 10/21/2020 1:00 PM Medical Record Number: [id number] Patient Account Number: [id number] Date of Birth/Sex: Treating RN: [birth year] (35 y.o.  [name] Primary Care Lashun Mccants: [name] Other Clinician: Referring Dannell Gortney: Treating Sherrita Riederer/Extender: [name] in Treatment: 45 Wound Status Wound Number: 3 Primary Etiology: Diabetic Wound/Ulcer of the Lower Extremity Wound Location: Left, Lateral Foot Wound Status: Open Wounding Event: Trauma Comorbid History: Type II Diabetes Date Acquired: 10/02/2019 Weeks Of Treatment: 51 Clustered Wound: No Photos Photo Uploaded By: Donavan Burnet on 10/22/2020 09:38:26 Wound Measurements Length: (cm) 2.5 Width: (cm) 2 Depth: (cm) 0.3 Area: (cm) 3.927 Volume: (cm) 1.178 % Reduction in Area: -138.1% % Reduction in Volume: -613.9% Epithelialization: Small (1-33%) Tunneling: No Undermining: No Wound Description Classification: Grade 2 Wound Margin: Thickened Exudate Amount: Large Exudate Type: Serosanguineous Exudate Color: red, brown Foul Odor After Cleansing: No Slough/Fibrino No Wound Bed Granulation Amount: Large (67-100%) Exposed Structure Granulation Quality: Pink Fascia Exposed: No Necrotic Amount: None Present (0%) Fat Layer (Subcutaneous Tissue) Exposed: Yes Tendon Exposed: No Muscle Exposed: No Joint Exposed: No Bone Exposed: No Treatment Notes Wound #3 (Foot) Wound Laterality: Left, Lateral Cleanser Soap and Water Discharge Instruction: May shower and wash wound with dial antibacterial soap and water prior to dressing change. Peri-Wound Care Zinc Oxide Ointment 30g tube Discharge Instruction: Apply Zinc Oxide to periwound with each dressing change as needed. Topical Primary Dressing Hydrofera Blue Classic Foam, 2x2 in Discharge Instruction: Moisten with saline prior to applying to wound bed Secondary Dressing Woven Gauze Sponge, Non-Sterile 4x4 in Discharge Instruction: Apply over primary dressing as directed. ABD Pad, 8x10 Discharge Instruction: T down before applying kerlix. Apply over primary dressing  as directed. ape Zetuvit Plus 4x8 in Discharge Instruction: Apply over primary dressing as directed. Secured With Compression Wrap Kerlix Roll 4.5x3.1 (in/yd) Discharge Instruction: Apply Kerlix and Coban compression under TCC. Coban Self-Adherent Wrap 4x5 (in/yd) Discharge Instruction: Apply over Kerlix as directed. Compression Stockings Add-Ons Electronic Signature(s) Signed: 10/22/2020 7:48:44 PM By: Rhae Hammock RN Signed: 10/26/2020 4:44:07 PM By: [name] RN, BSN Entered By: Rhae Hammock  on 10/21/2020 13:49:08 -------------------------------------------------------------------------------- Wound Assessment Details Patient Name: Date of Service: [name] 10/21/2020 1:00 PM Medical Record Number: [id number] Patient Account Number: [id number] Date of Birth/Sex: Treating RN: 11-02-1985 (35 y.o. [name] Primary Care Tarvaris Puglia: [name] Other Clinician: Referring Skarlet Lyons: Treating Brentin Shin/Extender: [name] in Treatment: 90 Wound Status Wound Number: 5 Primary Etiology: Diabetic Wound/Ulcer of the Lower Extremity Wound Location: Right, Lateral Metatarsal head fifth Wound Status: Open Wounding Event: Gradually Appeared Comorbid History: Type II Diabetes Date Acquired: 07/07/2020 Weeks Of Treatment: 14 Clustered Wound: No Photos Photo Uploaded By: Donavan Burnet on 10/22/2020 09:38:26 Wound Measurements Length: (cm) 1.3 Width: (cm) 1.4 Depth: (cm) 1.3 Area: (cm) 1.429 Volume: (cm) 1.858 % Reduction in Area: 83.8% % Reduction in Volume: 57.8% Epithelialization: Small (1-33%) Tunneling: No Undermining: No Wound Description Classification: Grade 2 Wound Margin: Distinct, outline attached Exudate Amount: Medium Exudate Type: Serosanguineous Exudate Color: red, brown Foul Odor After Cleansing: No Slough/Fibrino Yes Wound Bed Granulation Amount: Medium (34-66%) Exposed Structure Granulation  Quality: Pink Fascia Exposed: No Necrotic Amount: Medium (34-66%) Fat Layer (Subcutaneous Tissue) Exposed: Yes Necrotic Quality: Adherent Slough Tendon Exposed: No Muscle Exposed: No Joint Exposed: No Bone Exposed: Yes Treatment Notes Wound #5 (Metatarsal head fifth) Wound Laterality: Right, Lateral Cleanser Normal Saline Discharge Instruction: Cleanse the wound with Normal Saline prior to applying Max clean dressing using gauze sponges, not tissue or cotton balls. Soap and Water Discharge Instruction: May shower and wash wound with dial antibacterial soap and water prior to dressing change. Peri-Wound Care Topical Primary Dressing KerraCel Ag Gelling Fiber Dressing, 2x2 in (silver alginate) Discharge Instruction: Apply silver alginate to wound bed as instructed Secondary Dressing ABD Pad, 5x9 Discharge Instruction: Apply over primary dressing as directed. Optifoam Non-Adhesive Dressing, 4x4 in Discharge Instruction: Apply over primary dressing, cut to make foam donut to offload Secured With [employer] Sterile, 4.5x3.1 (in/yd) Discharge Instruction: Secure with Kerlix as directed. 55M Medipore H Soft Cloth Surgical T ape, 2x2 (in/yd) Discharge Instruction: Secure dressing with tape as directed. Compression Wrap Compression Stockings Add-Ons Electronic Signature(s) Signed: 10/26/2020 4:44:07 PM By: [name] RN, BSN Entered By: [name] on 10/21/2020 14:08:13 -------------------------------------------------------------------------------- Wound Assessment Details Patient Name: Date of Service: [name], [name] 10/21/2020 1:00 PM Medical Record Number: [id number] Patient Account Number: [id number] Date of Birth/Sex: Treating RN: [birth year] (35 y.o. [name] Primary Care Yahel Fuston: [name] Other Clinician: Referring Hayleen Clinkscales: Treating Gwendalyn Mcgonagle/Extender: [name] in Treatment: 49 Wound Status Wound Number: 7 Primary  Etiology: Diabetic Wound/Ulcer of the Lower Extremity Wound Location: Right, Anterior Ankle Wound Status: Open Wounding Event: Gradually Appeared Comorbid History: Type II Diabetes Date Acquired: 08/17/2020 Weeks Of Treatment: 9 Clustered Wound: No Photos Photo Uploaded By: Donavan Burnet on 10/22/2020 12:50:40 Wound Measurements Length: (cm) 0.5 Width: (cm) 0.5 Depth: (cm) 0.1 Area: (cm) 0.196 Volume: (cm) 0.02 % Reduction in Area: 91.4% % Reduction in Volume: 91.2% Epithelialization: Medium (34-66%) Tunneling: No Undermining: No Wound Description Classification: Grade 2 Wound Margin: Distinct, outline attached Exudate Amount: Medium Exudate Type: Serosanguineous Exudate Color: red, brown Foul Odor After Cleansing: No Slough/Fibrino No Wound Bed Granulation Amount: Large (67-100%) Exposed Structure Granulation Quality: Red Fascia Exposed: No Necrotic Amount: None Present (0%) Fat Layer (Subcutaneous Tissue) Exposed: Yes Tendon Exposed: No Muscle Exposed: No Joint Exposed: No Bone Exposed: No Treatment Notes Wound #7 (Ankle) Wound Laterality: Right, Anterior Cleanser Normal Saline Discharge Instruction: Cleanse the wound with Normal Saline  prior to applying Max clean dressing using gauze sponges, not tissue or cotton balls. Soap and Water Discharge Instruction: May shower and wash wound with dial antibacterial soap and water prior to dressing change. Peri-Wound Care Topical Primary Dressing KerraCel Ag Gelling Fiber Dressing, 2x2 in (silver alginate) Discharge Instruction: Apply silver alginate to wound bed as instructed Secondary Dressing Woven Gauze Sponge, Non-Sterile 4x4 in Discharge Instruction: Apply over primary dressing as directed. ABD Pad, 5x9 Discharge Instruction: Apply over primary dressing as directed. Secured With [employer], 4.5x3.1 (in/yd) Discharge Instruction: Secure with Kerlix as directed. 5M Medipore H Soft Cloth Surgical T  ape, 2x2 (in/yd) Discharge Instruction: Secure dressing with tape as directed. Compression Wrap Compression Stockings Add-Ons Electronic Signature(s) Signed: 10/26/2020 4:44:07 PM By: [name] RN, BSN Entered By: [name] on 10/21/2020 14:08:30 -------------------------------------------------------------------------------- Vitals Details Patient Name: Date of Service: [name], [name] 10/21/2020 1:00 PM Medical Record Number: [id number] Patient Account Number: [id number] Date of Birth/Sex: Treating RN: [birth year] (35 y.o. [name] Primary Care Tanylah Schnoebelen: [name] Other Clinician: Referring Arthor Gorter: Treating Aneliese Beaudry/Extender: [name] in Treatment: 51 Vital Signs Time Taken: 13:18 Temperature (F): 98.5 Height (in): 77 Pulse (bpm): 89 Weight (lbs): 280 Respiratory Rate (breaths/min): 20 Body Mass Index (BMI): 33.2 Blood Pressure (mmHg): 141/93 Capillary Blood Glucose (mg/dl): 130 Reference Range: 80 - 120 mg / dl Electronic Signature(s) Signed: 10/23/2020 10:49:45 AM By: [name] Entered By: [name] on 10/21/2020 13:19:10

## 2020-10-28 ENCOUNTER — Encounter (HOSPITAL_BASED_OUTPATIENT_CLINIC_OR_DEPARTMENT_OTHER): Payer: BC Managed Care – PPO | Admitting: Internal Medicine

## 2020-10-28 ENCOUNTER — Other Ambulatory Visit: Payer: Self-pay

## 2020-10-28 DIAGNOSIS — E11621 Type 2 diabetes mellitus with foot ulcer: Secondary | ICD-10-CM | POA: Diagnosis not present

## 2020-11-09 ENCOUNTER — Other Ambulatory Visit: Payer: Self-pay

## 2020-11-09 ENCOUNTER — Encounter (HOSPITAL_BASED_OUTPATIENT_CLINIC_OR_DEPARTMENT_OTHER): Payer: BC Managed Care – PPO | Attending: Internal Medicine | Admitting: Physician Assistant

## 2020-11-09 DIAGNOSIS — M659 Synovitis and tenosynovitis, unspecified: Secondary | ICD-10-CM | POA: Diagnosis not present

## 2020-11-09 DIAGNOSIS — Z8616 Personal history of COVID-19: Secondary | ICD-10-CM | POA: Insufficient documentation

## 2020-11-09 DIAGNOSIS — I10 Essential (primary) hypertension: Secondary | ICD-10-CM | POA: Diagnosis not present

## 2020-11-09 DIAGNOSIS — E11621 Type 2 diabetes mellitus with foot ulcer: Secondary | ICD-10-CM | POA: Diagnosis not present

## 2020-11-09 DIAGNOSIS — L97529 Non-pressure chronic ulcer of other part of left foot with unspecified severity: Secondary | ICD-10-CM | POA: Diagnosis present

## 2020-11-09 DIAGNOSIS — L97528 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ity: Secondary | ICD-10-CM | POA: Insufficient documentation

## 2020-11-10 NOTE — Progress Notes (Signed)
[place], [name] ([id number]) Visit Report for 11/09/2020 Chief Complaint Document Details Patient Name: Date of Service: [name] 11/09/2020 3:15 PM Medical Record Number: [id number] Patient Account Number: [id number] Date of Birth/Sex: Treating RN: [birth year] (35 y.o. [name] Primary Care Provider: [name] Other Clinician: Referring Provider: Treating Provider/Extender: Jennye Moccasin in Treatment: 42 Information Obtained from: Patient Chief Complaint 01/11/2019; patient is here for review of a rather substantial wound over the left fifth plantar metatarsal head extending into the lateral part of his foot 10/24/2019; patient returns to clinic with wounds on his bilateral feet with underlying osteomyelitis biopsy-proven Electronic Signature(s) Signed: 11/09/2020 4:45:41 PM By: Worthy Keeler PA-C Entered By: Worthy Keeler on 11/09/2020 16:45:41 -------------------------------------------------------------------------------- Debridement Details Patient Name: Date of Service: [name], [name] 11/09/2020 3:15 PM Medical Record Number: [id number] Patient Account Number: [id number] Date of Birth/Sex: Treating RN: 1985-07-01 (35 y.o. [name] Primary Care Provider: [name] Other Clinician: Referring Provider: Treating Provider/Extender: Jennye Moccasin in Treatment: 54 Debridement Performed for Assessment: Wound #3 Left,Lateral Foot Performed By: Physician Worthy Keeler, PA Debridement Type: Debridement Severity of Tissue Pre Debridement: Fat layer exposed Level of Consciousness (Pre-procedure): Awake and Alert Pre-procedure Verification/Time Out Yes - 16:44 Taken: Start Time: 16:45 Pain Control: Other : Benzocaine T Area Debrided (L x W): otal 1.7 (cm) x 0.8 (cm) = 1.36 (cm) Tissue and other material debrided: Non-Viable, Callus, Subcutaneous, Biofilm Level: Skin/Subcutaneous Tissue Debridement  Description: Excisional Instrument: Curette Bleeding: Minimum Hemostasis Achieved: Pressure End Time: 16:50 Response to Treatment: Procedure was tolerated well Level of Consciousness (Post- Awake and Alert procedure): Post Debridement Measurements of Total Wound Length: (cm) 1.7 Width: (cm) 0.8 Depth: (cm) 0.2 Volume: (cm) 0.214 Character of Wound/Ulcer Post Debridement: Stable Severity of Tissue Post Debridement: Necrosis of muscle Post Procedure Diagnosis Same as Pre-procedure Electronic Signature(s) Signed: 11/09/2020 5:53:47 PM By: [name] Signed: 11/09/2020 6:00:42 PM By: Worthy Keeler PA-C Entered By: [name] on 11/09/2020 16:50:47 -------------------------------------------------------------------------------- HPI Details Patient Name: Date of Service: [name], [name] 11/09/2020 3:15 PM Medical Record Number: [id number] Patient Account Number: [id number] Date of Birth/Sex: Treating RN: Apr 07, 1985 (35 y.o. 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 He comes in for review of a wound over the plantar fifth metatarsal head extending into the lateral part of the foot. He was followed for this previously by his podiatrist Dr. Cornelius Moras. As the patient tells his story he went to see podiatry first for a swelling he developed on the lateral part of his fifth metatarsal head in May. He states this was "open" by podiatry and the area closed. He was followed up in June and it was again opened callus removed and it closed promptly. There were plans being made for surgery on the fifth metatarsal head in June however his blood sugar was apparently too high for anesthesia. Apparently the area was  debrided and opened again in June and it is never closed since. Looking over the records from podiatry I am really not able to follow this. It was clear when he was first seen it was before 5/14 at that point he already had a wound. By 5/17 the ulcer was resolved. I  do not see anything about a procedure. On 5/28 noted to have pre-ulcerative moderate keratosis. X-ray noted 1/5 contracted toe and tailor's bunion and metatarsal deformity. On a visit date on 09/28/2018 the dorsal part of the left foot it healed and resolved. There was concern about swelling in his lower extremity he was sent to the ER.. As far as I can tell he was seen in the ER on 7/12 with an ulcer on his left foot. A DVT rule out of the left leg was negative. I do not think I have complete records from podiatry but I am not able to verify the procedures this patient states he had. He states after the last procedure the wound has never closed although I am not able to follow this in the records I have from podiatry. He has not had a recent x-ray The patient has been using Neosporin on the wound. He is wearing a Darco shoe. He is still very active up on his foot working and exercising. Past medical history; type 2 diabetes ketosis-prone, leg swelling with a negative DVT study in July. Non-smoker ABI in our clinic was 0.85 on the left 10/16; substantial wound on the plantar left fifth met head extending laterally almost to the dorsal fifth MTP. We have been using silver alginate we gave him a Darco forefoot off loader. An x-ray did not show evidence of osteomyelitis did note soft tissue emphysema which I think was due to gas tracking through an open wound. There is no doubt in my mind he requires an MRI 10/23; MRI not booked until 3 November at the earliest this is largely due to his glucose sensor in the right arm. We have been using silver alginate. There has been an improvement 10/29; I am still not exactly sure when his MRI is booked  for. He says it is the third but it is the 10th in epic. This definitely needs to be done. He is running a low-grade fever today but no other symptoms. No real improvement in the 1 02/26/2019 patient presents today for a follow-up visit here in our clinic he is last been seen in the clinic on October 29. Subsequently we were working on getting MRI to evaluate and see what exactly was going on and where we would need to go from the standpoint of whether or not he had osteomyelitis and again what treatments were going be required. Subsequently the patient ended up being admitted to the hospital on 02/07/2019 and was discharged on 02/14/2019. This is a somewhat interesting admission with a discharge diagnosis of pneumonia due to COVID-19 although he was positive for COVID-19 when tested at the urgent care but negative x2 when he was actually in the hospital. With that being said he did have acute respiratory failure with hypoxia and it was noted he also have a left foot ulceration with osteomyelitis. With that being said he did require oxygen for his pneumonia and I level 4 L. He was placed on antivirals and steroids for the COVID-19. He was also transferred to the [facility] at one point. Nonetheless he did subsequently discharged home and since being home has done much better in that regard. The CT angiogram did not show any pulmonary embolism. With regard to the osteomyelitis the patient was placed on vancomycin and Zosyn while in the hospital but has been changed to Augmentin at discharge. It was also recommended that he follow- up with wound care and podiatry. Podiatry however wanted him to  see [name] according to the patient prior to them doing anything further. His hemoglobin A1c was 9.9 as noted in the hospital. Have an MRI of the left foot performed while in the hospital on 02/04/2019. This showed evidence of septic arthritis at the fifth MTP joint and osteomyelitis involving the fifth  metatarsal head and proximal phalanx. There is an overlying plantar open wound noted an abscess tracking back along the lateral aspect of the fifth metatarsal shaft. There is otherwise diffuse cellulitis and mild fasciitis without findings of polymyositis. The patient did have recently pneumonia secondary to COVID-19 I looked in the chart through epic and it does appear that the patient may need to have an additional x-ray just to ensure everything is cleared and that he has no airspace disease prior to putting him into the [name]. 03/05/2019; patient was readmitted to the clinic last week. He was hospitalized twice for a viral upper respiratory tract infection from 11/1 through 11/4 and then 11/5 through 11/12 ultimately this turned out to be Covid pneumonitis. Although he was discharged on oxygen he is not using it. He says he feels fine. He has no exercise limitation no cough no sputum. His O2 sat in our clinic today was 100% on room air. 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 He received Vanco and Zosyn in the hospital and then was discharged on 2 weeks of Augmentin. I do not see any relevant cultures. He was supposed to follow-up with infectious disease but I do not see that he has an appointment. 12/8; patient saw Dr. Novella Olive of infectious disease last week. He felt that he had had adequate antibiotic therapy. He did not go to follow-up with Dr. Amalia Hailey of podiatry and I have again talked to him about the pros and cons of this. He does not want to consider a ray amputation of this time. He is aware of the risks of recurrence, migration etc. He started HBO today and tolerated this well. He can complete the Augmentin that I gave him last week. I have looked over the lab work that Dr. Chana Bode ordered his C-reactive protein was 3.3 and his sedimentation rate was 17. The C-reactive  protein is never really been measurably that high in this patient 12/15; not much change in the wound today however he has undermining along the lateral part of the foot again more extensively than last week. He has some rims of epithelialization. We have been using silver alginate. He is undergoing hyperbarics but did not dive today 12/18; in for his obligatory first total contact cast change. Unfortunately there was pus coming from the undermining area around his fifth metatarsal head. This was cultured but will preclude reapplication of a cast. He is seen in conjunction with HBO 12/24; patient had staph lugdunensis in the wound in the undermining area laterally last time. We put him on doxycycline which should have covered this. The wound looks better today. I am going to give him another week of doxycycline before reattempting the total contact cast 12/31; the patient is completing antibiotics. Hemorrhagic debris in the distal part of the wound with some undermining distally. He also had hyper granulation. Extensive debridement with a #5 curette. The infected area that was on the lateral part of the fifth met head is closed over. I do not think he needs any more antibiotics. Patient was seen prior to HBO. Preparations  for a total contact cast were made in the cast will be placed post hyperbarics 04/11/19; once again the patient arrives today without complaint. He had been in a cast all week noted that he had heavy drainage this week. This resulted in large raised areas of macerated tissue around the wound 1/14; wound bed looks better slightly smaller. Hydrofera Blue has been changing himself. He had a heavy drainage last week which caused a lot of maceration around the wound so I took him out of a total contact cast he says the drainage is actually better this week He is seen today in conjunction with HBO 1/21; returns to clinic. He was up in [place] for a day or 2 attending a funeral. He comes  back in with the wound larger and with a large area of exposed bone. He had osteomyelitis and septic arthritis of the fifth left metatarsal head while he was in hospital. He received IV antibiotics in the hospital for a prolonged period of time then 3 weeks of Augmentin. Subsequently I gave him 2 weeks of doxycycline for more superficial wound infection. When I saw this last week the wound was smaller the surface of the wound looks satisfactory. 1/28; patient missed hyperbarics today. Bone biopsy I did last time showed Enterococcus faecalis and Staphylococcus lugdunensis . He has a wide area of exposed bone. We are going to use silver alginate as of today. I had another ethical discussion with the patient. 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 In this situation I think the likelihood of healing this is low. Therefore I have recommended a ray amputation and with the patient's agreement I have referred him to Dr. Doran Durand. The other issue is that his compliance with hyperbarics has been minimal because of his work schedule and given his underlying decision I am going to stop this today READMISSION 10/24/2019 MRI 09/29/2019 left foot IMPRESSION: 1. Apparent skin ulceration inferior and lateral to the 5th metatarsal base with underlying heterogeneous T2 signal and enhancement in the subcutaneous fat. Small peripherally enhancing fluid collections along the plantar and lateral aspects of the 5th metatarsal base suspicious for abscesses. 2. Interval amputation through the mid 5th metatarsal with nonspecific low-level marrow edema and enhancement. Given the proximity to the adjacent soft tissue inflammatory changes, osteomyelitis cannot be excluded. 3. The additional bones appear unremarkable. MRI 09/29/2019 right foot IMPRESSION: 1. Soft tissue ulceration lateral to the 5th MTP joint. There is low-level T2 hyperintensity within the 4th and 5th metatarsal  heads and adjacent proximal phalanges without abnormal T1 signal or cortical destruction. These findings are nonspecific and could be seen with early marrow edema, hyperemia or early osteomyelitis. No evidence of septic joint. 2. Mild tenosynovitis and synovial enhancement associated with the extensor digitorum tendons at the level of the midfoot. 3. Diffuse low-level muscular T2 hyperintensity and enhancement, most consistent with diabetic myopathy. LEFT FOOT BONE Methicillin resistant staphylococcus aureus Staphylococcus lugdunensis MIC MIC CIPROFLOXACIN >=8 RESISTANT Resistant <=0.5 SENSI... Sensitive CLINDAMYCIN <=0.25 SENS... Sensitive >=8 RESISTANT Resistant ERYTHROMYCIN >=8 RESISTANT Resistant >=8 RESISTANT Resistant GENTAMICIN <=0.5 SENSI... Sensitive <=0.5 SENSI... Sensitive Inducible Clindamycin NEGATIVE Sensitive NEGATIVE Sensitive OXACILLIN >=4 RESISTANT Resistant 2 SENSITIVE Sensitive RIFAMPIN <=0.5 SENSI... Sensitive <=0.5 SENSI... Sensitive TETRACYCLINE <=1 SENSITIVE Sensitive <=1 SENSITIVE Sensitive TRIMETH/SULFA <=10 SENSIT Sensitive <=10 SENSIT Sensitive ... Marland Kitchen.. VANCOMYCIN 1 SENSITIVE Sensitive <=0.5 SENSI... Sensitive Right foot bone . Component 3 wk ago Specimen Description BONE Special Requests RIGHT 4 METATARSAL SAMPLE B Gram Stain NO  WBC SEEN NO ORGANISMS SEEN Culture RARE METHICILLIN RESISTANT STAPHYLOCOCCUS AUREUS NO ANAEROBES ISOLATED Performed at [facility], [place] [address], [place], [place] [address] Report Status 10/08/2019 FINAL Organism ID, Bacteria METHICILLIN RESISTANT STAPHYLOCOCCUS AUREUS Resulting Agency [facility] Susceptibility Methicillin resistant staphylococcus aureus MIC CIPROFLOXACIN >=8 RESISTANT Resistant CLINDAMYCIN <=0.25 SENS... Sensitive ERYTHROMYCIN >=8 RESISTANT Resistant GENTAMICIN <=0.5 SENSI... Sensitive Inducible Clindamycin NEGATIVE Sensitive OXACILLIN >=4 RESISTANT Resistant RIFAMPIN <=0.5 SENSI...  Sensitive TETRACYCLINE <=1 SENSITIVE Sensitive TRIMETH/SULFA <=10 SENSIT Sensitive ... VANCOMYCIN 1 SENSITIVE Sensitive This is a patient we had in clinic earlier this year with a wound over his left fifth metatarsal head. 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 Doran Durand on 05/16/2019. He seems to have developed problems with wounds on his bilateral feet in June 2021 although he may have had problems earlier than this. He was in an urgent care with a right foot ulcer on 09/26/2019 and given a course of doxycycline. This was apparently after having trouble getting into see orthopedics. He was seen by podiatry on 09/28/2019 noted to have bilateral lower extremity ulcers including the left lateral fifth metatarsal base and the right subfifth met head. It was noted that had purulent drainage at that time. He required hospitalization from 6/20 through 7/2. This was because of worsening right foot wounds. He underwent bilateral operative incision and drainage and bone biopsies bilaterally. Culture results are listed above. He has been referred back to clinic by Dr. Jacqualyn Posey of podiatry. He is also followed by Dr. Megan Salon who saw him yesterday. He was discharged from hospital on Zyvox Flagyl and Levaquin and yesterday changed to doxycycline Flagyl and Levaquin. His inflammatory markers on 6/26 showed a sedimentation rate of 129 and a C-reactive protein of 5. This is improved to 14 and 1.3 respectively. This would indicate improvement. ABIs in our clinic today were 1.23 on the right and 1.20 on the left 11/01/2019 on evaluation today patient appears to be doing fairly well in regard to the wounds on his feet at this point.  Fortunately there is no signs of active infection at this time. No fevers, chills, nausea, vomiting, or diarrhea. He currently is seeing infectious disease and still under their care at this point. Subsequently he also has both wounds which she has not been using collagen on as he did not receive that in his packaging he did not call us and let us know that. Apparently that just was missed on the order. Nonetheless we will get that straightened out today. 8/9-Patient returns for bilateral foot wounds, using Prisma with hydrogel moistened dressings, and the wounds appear stable. Patient using surgical shoes, avoiding much pressure or weightbearing as much as possible 8/16; patient has bilateral foot wounds. 1 on the right lateral foot proximally the other is on the left mid lateral foot. Both required debridement of callus and thick skin around the wounds. We have been using silver collagen 8/27; patient has bilateral lateral foot wounds. The area on the left substantially surrounded by callus and dry skin. This was removed from the wound edge. The underlying wound is small. The area on the right measured somewhat smaller today. We've been using silver collagen the patient was on antibiotics for underlying osteomyelitis in the left  foot. Unfortunately I did not update his antibiotics during today's visit. 9/10 I reviewed Dr. Hale Bogus last notes he felt he had completed antibiotics his inflammatory markers were reasonably well controlled. He has a small wound on the lateral left foot and a tiny area on the right which is just above closed. He is using Hydrofera Blue with border foam he has bilateral surgical shoes 9/24; 2 week f/u. doing well. right foot is closed. left foot still undermined. 10/14; right foot remains closed at the fifth met head. The area over the base of the left fifth metatarsal has a small open area but considerable undermining towards the plantar foot. Thick callus skin around  this suggests an adequate pressure relief. We have talked about this. He says he is going to go back into his cam boot. I suggested a total contact cast he did not seem enamored with this suggestion 10/26; left foot base of the fifth metatarsal. Same condition as last time. He has skin over the area with an open wound however the skin is not adherent. He went to see Dr. Earleen Newport who did an x-ray and culture of his foot I have not reviewed the x-ray but the patient was not told anything. He is on doxycycline 11/11; since the patient was last here he was in the emergency room on 10/30 he was concerned about swelling in the left foot. They did not do any cultures or x-rays. They changed his antibiotics to cephalexin. Previous culture showed group B strep. The cephalexin is appropriate as doxycycline has less than predictable coverage. Arrives in clinic today with swelling over this area under the wound. He also has a new wound on the right fifth metatarsal head 11/18; the patient has a difficult wound on the lateral aspect of the left fifth metatarsal head. The wound was almost ballotable last week I opened it slightly expecting to see purulence however there was just bleeding. I cultured this this was negative. X-ray unchanged. We are trying to get an MRI but I am not sure were going to be able to get this through his insurance. He also has an area on the right lateral fifth metatarsal head this looks healthier 12/3; the patient finally got our MRI. Surprisingly this did not show osteomyelitis. I did show the soft tissue ulceration at the lateral plantar aspect of the fifth metatarsal base with a tiny residual 6 mm abscess overlying the superficial fascia I have tried to culture this area I have not been able to get this to grow anything. Nevertheless the protruding tissue looks aggravated. I suspect we should try to treat the underlying "abscess with broad-spectrum antibiotics. I am going to start him on  Levaquin and Flagyl. He has much less edema in his legs and I am going to continue to wrap his legs and see him weekly 12/10. I started Levaquin and Flagyl on him last week. He just picked up the Flagyl apparently there was some delay. The worry is the wound on the left fifth metatarsal base which is substantial and worsening. His foot looks like he inverts at the ankle making this a weightbearing surface. Certainly no improvement in fact I think the measurements of this are somewhat worse. We have been using 12/17; he apparently just got the Levaquin yesterday this is 2 weeks after the fact. He has completed the Flagyl. The area over the left fifth metatarsal base still has protruding granulation tissue although it does not look quite as bad as it  did some weeks ago. He has severe bilateral lymphedema although we have not been treating him for wounds on his legs this is definitely going to require compression. There was so much edema in the left I did not wish to put him in a total contact cast today. I am going to increase his compression from 3-4 layer. The area on the right lateral fifth met head actually look quite good and superficial. 12/23; patient arrived with callus on the right fifth met head and the substantial hyper granulated callused wound on the base of his fifth metatarsal. He says he is completing his Levaquin in 2 days but I do not think that adds up with what I gave him but I will have to double check this. We are using Hydrofera Blue on both areas. My plan is to put the left leg in a cast the week after New Year's 04/06/2020; patient's wounds about the same. Right lateral fifth metatarsal head and left lateral foot over the base of the fifth metatarsal. There is undermining on the left lateral foot which I removed before application of total contact cast continuing with Hydrofera Blue new. Patient tells me he was seen by endocrinology today lab work was done [Dr. Kerr]. Also  wondering whether he was referred to cardiology. I went over some lab work from previously does not have chronic renal failure certainly not nephrotic range proteinuria he does have very poorly controlled diabetes but this is not his most updated lab work. Hemoglobin A1c has been over 11 1/10; the patient had a considerable amount of leakage towards mid part of his left foot with macerated skin however the wound surface looks better the area on the right lateral fifth met head is better as well. I am going to change the dressing on the left foot under the total contact cast to silver alginate, continue with Hydrofera Blue on the right. 1/20; patient was in the total contact cast for 10 days. Considerable amount of drainage although the skin around the wound does not look too bad on the left foot. The area on the right fifth metatarsal head is closed. Our nursing staff reports large amount of drainage out of the left lateral foot wound 1/25; continues with copious amounts of drainage described by our intake staff. PCR culture I did last week showed E. coli and Enterococcus faecalis and low quantities. Multiple resistance genes documented including extended spectrum beta lactamase, MRSA, MRSE, quinolone, tetracycline. The wound is not quite as good this week as it was 5 days ago but about the same size 2/3; continues with copious amounts of malodorous drainage per our intake nurse. The PCR culture I did 2 weeks ago showed E. coli and low quantities of Enterococcus. There were multiple resistance genes detected. I put Neosporin on him last week although this does not seem to have helped. The wound is slightly deeper today. Offloading continues to be an issue here although with the amount of drainage she has a total contact cast is just not going to work 2/10; moderate amount of drainage. Patient reports he cannot get his stocking on over the dressing. I told him we have to do that the nurse gave  him suggestions on how to make this work. The wound is on the bottom and lateral part of his left foot. Is cultured predominantly grew low amounts of Enterococcus, E. coli and anaerobes. There were multiple resistance genes detected including extended spectrum beta lactamase, quinolone, tetracycline. I could not think  of an easy oral combination to address this so for now I am going to do topical antibiotics provided by [facility] I think the main agents here are vancomycin and an aminoglycoside. We have to be able to give him access to the wounds to get the topical antibiotic on 2/17; moderate amount of drainage this is unchanged. He has his Keystone topical antibiotic against the deep tissue culture organisms. He has been using this and changing the dressing daily. Silver alginate on the wound surface. 2/24; using Keystone antibiotic with silver alginate on the top. He had too much drainage for a total contact cast at one point although I think that is improving and I think in the next week or 2 it might be possible to replace a total contact cast I did not do this today. In general the wound surface looks healthy however he continues to have thick rims of skin and subcutaneous tissue around the wide area of the circumference which I debrided 06/04/2020 upon evaluation today patient appears to be doing well in regard to his wound. I do feel like he is showing signs of improvement. There is little bit of callus and dead tissue around the edges of the wound as well as what appears to be a little bit of a sinus tract that is off to the side laterally I would perform debridement to clear that away today. 3/17; left lateral foot. The wound looks about the same as I remember. Not much depth surface looks healthy. No evidence of infection 3/25; left lateral foot. Wound surface looks about the same. Separating epithelium from the circumference. There really is no evidence of infection here however not making  progress by my view 3/29; left lateral foot. Surface of the wound again looks reasonably healthy still thick skin and subcutaneous tissue around the wound margins. There is no evidence of infection. One of the concerns being brought up by the nurses has again the amount of drainage vis--vis continued use of a total contact cast 4/5; left lateral foot at roughly the base of the fifth metatarsal. Nice healthy looking granulated tissue with rims of epithelialization. The overall wound measurements are not any better but the tissue looks healthy. The only concern is the amount of drainage although he has no surrounding maceration with what we have been doing recently to absorb fluid and protect his skin. He also has lymphedema. 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 He states that the kerlix/coban under the TCC rolled down and could not get it back up. He has been using an offloading scooter and has somehow hurt his right foot using this device. This happened last week. He states that the side of his right foot developed a blister and opened. The top of his foot also has a few small open wounds he thinks is due to his socks rubbing in his shoes. He has not been using any dressings to the wound. He denies purulent drainage, fever/chills or erythema to the wounds. 4/22; patient presents for 1 week follow-up. He developed new wounds to the right foot that were evaluated at last clinic visit. He continues to have a total contact cast to the left leg and he reports no issues. He has  been using silver collagen to the right foot wounds with no issues. He denies purulent drainage, fever/chills or erythema to the right foot wounds. He has no complaints today 4/25;  patient presents for 1 week follow-up. He has a total contact cast of the left leg and reports no issues. He has been using silver alginate to the right foot wound. He denies purulent drainage, fever/chills or erythema to the right foot wounds. 5/2 patient presents for 1 week follow-up. T contact cast on the left. The wound which is on the base of the plantar foot at the base of the fifth metatarsal otal actually looks quite good and dimensions continue to gradually contract. HOWEVER the area on the right lateral fifth metatarsal head is much larger than what I remember from 2 weeks ago. Once more is he has significant levels of hypergranulation. Noteworthy that he had this same hyper granulated response on his wound on the left foot at one point in time. So much so that he I thought there was an underlying fluid collection. Based on this I think this just needs debridement. 5/9; the wound on the left actually continues to be gradually smaller with a healthy surface. Slight amount of drainage and maceration of the skin around but not too bad. However he has a large wound over the right fifth metatarsal head very much in the same configuration as his left foot wound was initially. I used silver nitrate to address the hyper granulated tissue no mechanical debridement 5/16; area on the left foot did not look as healthy this week deeper thick surrounding macerated skin and subcutaneous tissue. The area on the right foot fifth met head was about the same The area on the right ankle that we identified last week is completely broken down into an open wound presumably a stocking rubbing issue 5/23; patient has been using a total contact cast to the left side. He has been using silver alginate underneath. He has also been using silver alginate to the right foot wounds. He has no complaints today. He denies any signs of infection. 5/31; the left-sided wound looks some better measure smaller surface  granulation looks better. We have been using silver alginate under the total contact cast The large area on his right fifth met head and right dorsal foot look about the same still using silver alginate 6/6; neither side is good as I was hoping although the surface area dimensions are better. A lot of maceration on his left and right foot around the wound edge. Area on the dorsal right foot looks better. He says he was traveling. I am not sure what does the amount of maceration around the plantar wounds may be drainage issues 6/13; in general the wound surfaces look quite good on both sides. Macerated skin and raised edges around the wound required debridement although in general especially on the left the surface area seems improved. The area on the right dorsal ankle is about the same I thought this would not be such a problem to close 6/20; not much change in either wound although the one on the right looks a little better. Both wounds have thick macerated edges to the skin requiring debridements. We have been using silver alginate. The area on the dorsal right ankle is still open I thought this would be closed. 6/28; patient comes in today with a marked deterioration in the right foot wound fifth met head. Wide area of exposed bone this is a drastic change from last  time. The area on the left there we have been casting is stagnant. We have been using silver alginate in both wound areas. 7/5; bone culture I did for PCR last time was positive for Pseudomonas, group B strep, Enterococcus and Staph aureus. There was no suggestion of methicillin resistance or ampicillin resistant genes. This was resistant to tetracycline however He comes into the clinic today with the area over his right plantar fifth metatarsal head which had been doing so well 2 weeks ago completely necrotic feeling bone. I do not know that this is going to be salvageable. The left foot wound is certainly no smaller but it has a  better surface and is superficial. 7/8; patient called in this morning to say that his total contact cast was rubbing against his foot. He states he is doing fine overall. He denies signs of infection. 7/12; continued deterioration in the wound over the right fifth metatarsal head crumbling bone. This is not going to be salvageable. The patient agrees and wants to be referred to Dr. Doran Durand which we will attempt to arrange as soon as possible. I am going to continue him on antibiotics as long as that takes so I will renew those today. The area on the left foot which is the base of the fifth metatarsal continues to look somewhat better. Healthy looking tissue no depth no debridement is necessary here. 7/20; the patient was kindly seen by Dr. Doran Durand of orthopedics on 10/19/2020. 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 Towards this end we have taken him out of the total contact cast on the left we will put him in a wrap with Hydrofera Blue. As I understand things surgery is planned for 7/21 7/27; patient had his surgery last Thursday. 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 Apparently everything went well we did not still disturb that today The area on the left foot actually looks quite good. He has been much less mobile which probably explains this he did not seem to do well in the total contact cast secondary to drainage and maceration I think. 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 Fortunately there is no evidence of active infection at this time. No fevers, chills, nausea, vomiting, or diarrhea. Overall I think that he is actually doing extremely well. Nonetheless I do believe that he is staying off of this more following the surgery in his right foot that is the reason the left is doing so  great. Electronic Signature(s) Signed: 11/09/2020 4:56:09 PM By: Worthy Keeler PA-C Entered By: Worthy Keeler on 11/09/2020 16:56:09 -------------------------------------------------------------------------------- Physical Exam Details Patient Name: Date of Service: [name] [name] 11/09/2020 3:15 PM Medical Record Number: [id number] Patient Account Number: [id number] Date of Birth/Sex: Treating RN: Jan 04, 1986 (35 y.o. [name] Primary Care Provider: [name] Other Clinician: Referring Provider: Treating Provider/Extender: Jennye Moccasin in Treatment: 98 Constitutional Well-nourished and well-hydrated in no acute distress. Respiratory normal breathing without difficulty. Psychiatric this patient is able to make decisions and demonstrates good insight into disease process. Alert and Oriented x 3. pleasant and cooperative. Notes Upon inspection patient's wound bed showed signs of good granulation epithelization at this point on the left. With that being said I do believe that  he is doing so well being off of this currently but we may need to consider a total contact cast on the left foot going forward if things do not seem to show signs of improvement. Especially when he begins to get back to school and work I think that is good to be a bigger issue potentially for him. Electronic Signature(s) Signed: 11/09/2020 4:56:36 PM By: Worthy Keeler PA-C Entered By: Worthy Keeler on 11/09/2020 16:56:36 -------------------------------------------------------------------------------- Physician Orders Details Patient Name: Date of Service: [name], [name] 11/09/2020 3:15 PM Medical Record Number: [id number] Patient Account Number: [id number] Date of Birth/Sex: Treating RN: 03-May-1985 (35 y.o. [name] Primary Care Provider: [name] Other Clinician: Referring Provider: Treating Provider/Extender: Jennye Moccasin in  Treatment: 76 Verbal / Phone Orders: No Diagnosis Coding ICD-10 Coding Code Description E11.621 Type 2 diabetes mellitus with foot ulcer L97.528 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ity L97.518 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ity Follow-up Appointments ppointment in 1 week. - with Dr. Dellia Nims Return A Edema Control - Lymphedema / SCD / Other Bilateral Lower Extremities Elevate legs to the level of the heart or above for 30 minutes daily and/or when sitting, a frequency of: - throughout the day Avoid standing for long periods of time. Exercise regularly Moisturize legs daily. - right leg every night before bed. Compression stocking or Garment 20-30 mm/Hg pressure to: - Apply to right leg in the morning and remove at night. Off-Loading Open toe surgical shoe to: - bilateral feet Wound Treatment Wound #3 - Foot Wound Laterality: Left, Lateral Cleanser: Soap and Water Every Other Day/30 Days Discharge Instructions: May shower and wash wound with dial antibacterial soap and water prior to dressing change. Peri-Wound Care: Zinc Oxide Ointment 30g tube Every Other Day/30 Days Discharge Instructions: Apply Zinc Oxide to periwound with each dressing change as needed. Prim Dressing: Hydrofera Blue Classic Foam, 2x2 in (Generic) Every Other Day/30 Days ary Discharge Instructions: Moisten with saline prior to applying to wound bed Secondary Dressing: Woven Gauze Sponge, Non-Sterile 4x4 in (Generic) Every Other Day/30 Days Discharge Instructions: Apply over primary dressing as directed. Secondary Dressing: ABD Pad, 8x10 (Generic) Every Other Day/30 Days Discharge Instructions: T down before applying kerlix. Apply over primary dressing as directed. ape Secured With: Coban Self-Adherent Wrap 4x5 (in/yd) Every Other Day/30 Days Discharge Instructions: Secure with Coban as directed. Secured With: [employer], 4.5x3.1 (in/yd) Every Other Day/30 Days Discharge Instructions: Secure with Kerlix as directed. Electronic Signature(s) Signed: 11/09/2020 5:53:47 PM By: [name] Signed: 11/09/2020 6:00:42 PM By: Worthy Keeler PA-C Entered By: [name] on 11/09/2020 16:54:31 -------------------------------------------------------------------------------- Problem List Details Patient Name: Date of Service: [name], [name] 11/09/2020 3:15 PM Medical Record Number: [id number] Patient Account Number: [id number] Date of Birth/Sex: Treating RN: 05-13-1985 (35 y.o. [name] Primary Care Provider: [name] Other Clinician: Referring Provider: Treating Provider/Extender: Jennye Moccasin in Treatment: 28 Active Problems ICD-10 Encounter Code Description Active Date MDM Diagnosis E11.621 Type 2 diabetes mellitus with foot ulcer 10/24/2019 No Yes L97.528 Non-pressure chronic ulcer of other part of left foot with other specified 10/24/2019 No Yes severity L97.518 Non-pressure chronic ulcer of other part of right foot with other specified 07/14/2020 No Yes severity Inactive Problems ICD-10 Code Description Active Date Inactive Date L97.518 Non-pressure chronic ulcer of other part of right foot with  other specified severity 10/24/2019 10/24/2019 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, right ankle and foot 10/24/2019 10/24/2019 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 08/10/2020 08/10/2020 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, left ankle and foot 10/24/2019 10/24/2019 B95.62 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 10/24/2019 10/24/2019 classified elsewhere Resolved Problems Electronic Signature(s) Signed: 11/09/2020 4:45:35 PM By: Worthy Keeler PA-C Previous Signature: 11/09/2020 4:08:33 PM Version By: [name] Entered By: Worthy Keeler on 11/09/2020  16:45:35 -------------------------------------------------------------------------------- Progress Note Details Patient Name: Date of Service: [name], [name] 11/09/2020 3:15 PM Medical Record Number: [id number] Patient Account Number: [id number] Date of Birth/Sex: Treating RN: [birth year] (35 y.o. 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 He comes in for review of a wound over the plantar fifth metatarsal head extending into the lateral part of the foot. He was followed for this previously by his podiatrist Dr. Cornelius Moras. As the patient tells his story he went to see podiatry first for a swelling he developed on the lateral part of his fifth metatarsal head in May. He states this was "open" by podiatry and the area closed. He was followed up in June and it was again opened callus removed and it closed promptly. There were plans being made for surgery on the fifth metatarsal head in June however his blood sugar was apparently too high for anesthesia. Apparently the area was debrided and opened again in June and it is never closed since. Looking over the records from podiatry I am really not able to follow this. It was clear when he was first seen it was before 5/14 at that point he already had a wound. By 5/17 the ulcer was resolved. I do not see anything about a procedure. On 5/28 noted to have  pre-ulcerative moderate keratosis. X-ray noted 1/5 contracted toe and tailor's bunion and metatarsal deformity. On a visit date on 09/28/2018 the dorsal part of the left foot it healed and resolved. There was concern about swelling in his lower extremity he was sent to the ER.. As far as I can tell he was seen in the ER on 7/12 with an ulcer on his left foot. A DVT rule out of the left leg was negative. I do not think I have complete records from podiatry but I am not able to verify the procedures this patient states he had. He states after the last procedure the wound has never closed although I am not able to follow this in the records I have from podiatry. He has not had a recent x-ray The patient has been using Neosporin on the wound. He is wearing a Darco shoe. He is still very active up on his foot working and exercising. Past medical history; type 2 diabetes ketosis-prone, leg swelling with a negative DVT study in July. Non-smoker ABI in our clinic was 0.85 on the left 10/16; substantial wound on the plantar left fifth met head extending laterally almost to the dorsal fifth MTP. We  have been using silver alginate we gave him a Darco forefoot off loader. An x-ray did not show evidence of osteomyelitis did note soft tissue emphysema which I think was due to gas tracking through an open wound. There is no doubt in my mind he requires an MRI 10/23; MRI not booked until 3 November at the earliest this is largely due to his glucose sensor in the right arm. We have been using silver alginate. There has been an improvement 10/29; I am still not exactly sure when his MRI is booked for. He says it is the third but it is the 10th in epic. This definitely needs to be done. He is running a low-grade fever today but no other symptoms. No real improvement in the 1 02/26/2019 patient presents today for a follow-up visit here in our clinic he is last been seen in the clinic on October 29. Subsequently we were  working on getting MRI to evaluate and see what exactly was going on and where we would need to go from the standpoint of whether or not he had osteomyelitis and again what treatments were going be required. Subsequently the patient ended up being admitted to the hospital on 02/07/2019 and was discharged on 02/14/2019. This is a somewhat interesting admission with a discharge diagnosis of pneumonia due to COVID-19 although he was positive for COVID-19 when tested at the urgent care but negative x2 when he was actually in the hospital. With that being said he did have acute respiratory failure with hypoxia and it was noted he also have a left foot ulceration with osteomyelitis. With that being said he did require oxygen for his pneumonia and I level 4 L. He was placed on antivirals and steroids for the COVID-19. He was also transferred to the [facility] at one point. Nonetheless he did subsequently discharged home and since being home has done much better in that regard. The CT angiogram did not show any pulmonary embolism. With regard to the osteomyelitis the patient was placed on vancomycin and Zosyn while in the hospital but has been changed to Augmentin at discharge. It was also recommended that he follow- up with wound care and podiatry. Podiatry however wanted him to see [name] according to the patient prior to them doing anything further. His hemoglobin A1c was 9.9 as noted in the hospital. Have an MRI of the left foot performed while in the hospital on 02/04/2019. 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 There is an overlying plantar open wound noted an abscess tracking back along the lateral aspect of the fifth metatarsal shaft. There is otherwise diffuse cellulitis and mild fasciitis without findings of polymyositis. The patient did have recently pneumonia secondary to COVID-19 I looked in the  chart through epic and it does appear that the patient may need to have an additional x-ray just to ensure everything is cleared and that he has no airspace disease prior to putting him into the [name]. 03/05/2019; patient was readmitted to the clinic last week. He was hospitalized twice for a viral upper respiratory tract infection from 11/1 through 11/4 and then 11/5 through 11/12 ultimately this turned out to be Covid pneumonitis. Although he was discharged on oxygen he is not using it. He says he feels fine. He has no exercise limitation no cough no sputum. His O2 sat in our clinic today was 100% on room air. 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 He received Vanco and Zosyn in the hospital and then was discharged on 2 weeks of Augmentin. I do not see any relevant cultures. He was supposed to follow-up with infectious disease but I do not see that he has an appointment. 12/8; patient saw Dr. Novella Olive of infectious disease last week. He felt that he had had adequate antibiotic therapy. He did not go to follow-up with Dr. Amalia Hailey of podiatry and I have again talked to him about the pros and cons of this. He does not want to consider a ray amputation of this time. He is aware of the risks of recurrence, migration etc. He started HBO today and tolerated this well. He can complete the Augmentin that I gave him last week. I have looked over the lab work that Dr. Chana Bode ordered his C-reactive protein was 3.3 and his sedimentation rate was 17. The C-reactive protein is never really been measurably that high in this patient 12/15; not much change in the wound today however he has undermining along the lateral part of the foot again more extensively than last week. He has some rims of epithelialization. We have been using silver alginate. He is undergoing hyperbarics but did not dive  today 12/18; in for his obligatory first total contact cast change. Unfortunately there was pus coming from the undermining area around his fifth metatarsal head. This was cultured but will preclude reapplication of a cast. He is seen in conjunction with HBO 12/24; patient had staph lugdunensis in the wound in the undermining area laterally last time. We put him on doxycycline which should have covered this. The wound looks better today. I am going to give him another week of doxycycline before reattempting the total contact cast 12/31; the patient is completing antibiotics. Hemorrhagic debris in the distal part of the wound with some undermining distally. He also had hyper granulation. Extensive debridement with a #5 curette. The infected area that was on the lateral part of the fifth met head is closed over. I do not think he needs any more antibiotics. Patient was seen prior to HBO. Preparations for a total contact cast were made in the cast will be placed post hyperbarics 04/11/19; once again the patient arrives today without complaint. He had been in a cast all week noted that he had heavy drainage this week. This resulted in large raised areas of macerated tissue around the wound 1/14; wound bed looks better slightly smaller. Hydrofera Blue has been changing himself. He had a heavy drainage last week which caused a lot of maceration around the wound so I took him out of a total contact cast he says the drainage is actually better this week He is seen today in conjunction with HBO 1/21; returns to clinic. He was up in [place] for a day or 2 attending a funeral. He comes back in with the wound larger and with a large area of exposed bone. He had osteomyelitis and septic arthritis of the fifth left metatarsal head while he was in hospital. He received IV antibiotics in the hospital for a prolonged period of time then 3 weeks of Augmentin. Subsequently I gave him 2 weeks of doxycycline for more  superficial wound infection. When I saw this last week the wound was smaller the surface of the wound looks satisfactory. 1/28; patient missed hyperbarics today. Bone biopsy I did last time  showed Enterococcus faecalis and Staphylococcus lugdunensis . He has a wide area of exposed bone. We are going to use silver alginate as of today. I had another ethical discussion with the patient. 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 In this situation I think the likelihood of healing this is low. Therefore I have recommended a ray amputation and with the patient's agreement I have referred him to Dr. Doran Durand. The other issue is that his compliance with hyperbarics has been minimal because of his work schedule and given his underlying decision I am going to stop this today READMISSION 10/24/2019 MRI 09/29/2019 left foot IMPRESSION: 1. Apparent skin ulceration inferior and lateral to the 5th metatarsal base with underlying heterogeneous T2 signal and enhancement in the subcutaneous fat. Small peripherally enhancing fluid collections along the plantar and lateral aspects of the 5th metatarsal base suspicious for abscesses. 2. Interval amputation through the mid 5th metatarsal with nonspecific low-level marrow edema and enhancement. Given the proximity to the adjacent soft tissue inflammatory changes, osteomyelitis cannot be excluded. 3. The additional bones appear unremarkable. MRI 09/29/2019 right foot IMPRESSION: 1. Soft tissue ulceration lateral to the 5th MTP joint. There is low-level T2 hyperintensity within the 4th and 5th metatarsal heads and adjacent proximal phalanges without abnormal T1 signal or cortical destruction. These findings are nonspecific and could be seen with early marrow edema, hyperemia or early osteomyelitis. No evidence of septic joint. 2. Mild tenosynovitis and synovial enhancement associated with the extensor digitorum tendons at the  level of the midfoot. 3. Diffuse low-level muscular T2 hyperintensity and enhancement, most consistent with diabetic myopathy. LEFT FOOT BONE Methicillin resistant staphylococcus aureus Staphylococcus lugdunensis MIC MIC CIPROFLOXACIN >=8 RESISTANT Resistant <=0.5 SENSI... Sensitive CLINDAMYCIN <=0.25 SENS... Sensitive >=8 RESISTANT Resistant ERYTHROMYCIN >=8 RESISTANT Resistant >=8 RESISTANT Resistant GENTAMICIN <=0.5 SENSI... Sensitive <=0.5 SENSI... Sensitive Inducible Clindamycin NEGATIVE Sensitive NEGATIVE Sensitive OXACILLIN >=4 RESISTANT Resistant 2 SENSITIVE Sensitive RIFAMPIN <=0.5 SENSI... Sensitive <=0.5 SENSI... Sensitive TETRACYCLINE <=1 SENSITIVE Sensitive <=1 SENSITIVE Sensitive TRIMETH/SULFA <=10 SENSIT Sensitive <=10 SENSIT Sensitive ... Marland Kitchen.. VANCOMYCIN 1 SENSITIVE Sensitive <=0.5 SENSI... Sensitive Right foot bone . Component 3 wk ago Specimen Description BONE Special Requests RIGHT 4 METATARSAL SAMPLE B Gram Stain NO WBC SEEN NO ORGANISMS SEEN Culture RARE METHICILLIN RESISTANT STAPHYLOCOCCUS AUREUS NO ANAEROBES ISOLATED Performed at [facility], [place] [address], [place], [place] [address] Report Status 10/08/2019 FINAL Organism ID, Bacteria METHICILLIN RESISTANT STAPHYLOCOCCUS AUREUS Resulting Agency [facility] Susceptibility Methicillin resistant staphylococcus aureus MIC CIPROFLOXACIN >=8 RESISTANT Resistant CLINDAMYCIN <=0.25 SENS... Sensitive ERYTHROMYCIN >=8 RESISTANT Resistant GENTAMICIN <=0.5 SENSI... Sensitive Inducible Clindamycin NEGATIVE Sensitive OXACILLIN >=4 RESISTANT Resistant RIFAMPIN <=0.5 SENSI... Sensitive TETRACYCLINE <=1 SENSITIVE Sensitive TRIMETH/SULFA <=10 SENSIT Sensitive ... VANCOMYCIN 1 SENSITIVE Sensitive This is a patient we had in clinic earlier this year with a wound over his left fifth metatarsal head. 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 Doran Durand on 05/16/2019. He seems to have developed problems with wounds on his bilateral feet in June 2021 although he may have had problems earlier than this. He was in an urgent care with a right foot ulcer on 09/26/2019 and given a course of doxycycline. This was apparently after having trouble getting into see orthopedics.  He was seen by podiatry on 09/28/2019 noted to have bilateral lower extremity ulcers including the left lateral fifth metatarsal base and the right subfifth met head. It was noted that had purulent drainage at that time. He required hospitalization from 6/20 through 7/2. This was because of worsening right foot wounds. He underwent bilateral operative incision and drainage and bone biopsies bilaterally. Culture results are listed above. He has been referred back to clinic by Dr. Jacqualyn Posey of podiatry. He is also followed by Dr. Megan Salon who saw him yesterday. He was discharged from hospital on Zyvox Flagyl and Levaquin and yesterday changed to doxycycline Flagyl and Levaquin. His inflammatory markers on 6/26 showed a sedimentation rate of 129 and a C-reactive protein of 5. This is improved to 14 and 1.3 respectively. This would indicate improvement. ABIs in our clinic today were 1.23 on the right and 1.20 on the left 11/01/2019 on evaluation today patient appears to be doing fairly well in regard to the wounds on his feet at this point. Fortunately there is no signs of active infection at this time. No fevers, chills, nausea, vomiting, or diarrhea. He currently is seeing infectious disease and still under their care at this point. Subsequently he also has both wounds which she has not been using collagen on as he did not receive that in his packaging he did not call us and  let us know that. Apparently that just was missed on the order. Nonetheless we will get that straightened out today. 8/9-Patient returns for bilateral foot wounds, using Prisma with hydrogel moistened dressings, and the wounds appear stable. Patient using surgical shoes, avoiding much pressure or weightbearing as much as possible 8/16; patient has bilateral foot wounds. 1 on the right lateral foot proximally the other is on the left mid lateral foot. Both required debridement of callus and thick skin around the wounds. We have been using silver collagen 8/27; patient has bilateral lateral foot wounds. The area on the left substantially surrounded by callus and dry skin. This was removed from the wound edge. The underlying wound is small. The area on the right measured somewhat smaller today. 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 Unfortunately I did not update his antibiotics during today's visit. 9/10 I reviewed Dr. Hale Bogus last notes he felt he had completed antibiotics his inflammatory markers were reasonably well controlled. He has a small wound on the lateral left foot and a tiny area on the right which is just above closed. He is using Hydrofera Blue with border foam he has bilateral surgical shoes 9/24; 2 week f/u. doing well. right foot is closed. left foot still undermined. 10/14; right foot remains closed at the fifth met head. The area over the base of the left fifth metatarsal has a small open area but considerable undermining towards the plantar foot. Thick callus skin around this suggests an adequate pressure relief. We have talked about this. He says he is going to go back into his cam boot. I suggested a total contact cast he did not seem enamored with this suggestion 10/26; left foot base of the fifth metatarsal. Same condition as last time. He has skin over the area with an open wound however the skin is not  adherent. He went to see Dr. Earleen Newport who did an x-ray and culture of his foot I have not reviewed the x-ray but the patient was not told anything. He is on doxycycline 11/11; since the  patient was last here he was in the emergency room on 10/30 he was concerned about swelling in the left foot. They did not do any cultures or x-rays. They changed his antibiotics to cephalexin. Previous culture showed group B strep. The cephalexin is appropriate as doxycycline has less than predictable coverage. Arrives in clinic today with swelling over this area under the wound. He also has a new wound on the right fifth metatarsal head 11/18; the patient has a difficult wound on the lateral aspect of the left fifth metatarsal head. The wound was almost ballotable last week I opened it slightly expecting to see purulence however there was just bleeding. I cultured this this was negative. X-ray unchanged. We are trying to get an MRI but I am not sure were going to be able to get this through his insurance. He also has an area on the right lateral fifth metatarsal head this looks healthier 12/3; the patient finally got our MRI. Surprisingly this did not show osteomyelitis. I did show the soft tissue ulceration at the lateral plantar aspect of the fifth metatarsal base with a tiny residual 6 mm abscess overlying the superficial fascia I have tried to culture this area I have not been able to get this to grow anything. Nevertheless the protruding tissue looks aggravated. I suspect we should try to treat the underlying "abscess with broad-spectrum antibiotics. I am going to start him on Levaquin and Flagyl. He has much less edema in his legs and I am going to continue to wrap his legs and see him weekly 12/10. I started Levaquin and Flagyl on him last week. He just picked up the Flagyl apparently there was some delay. The worry is the wound on the left fifth metatarsal base which is substantial and worsening. His foot looks  like he inverts at the ankle making this a weightbearing surface. Certainly no improvement in fact I think the measurements of this are somewhat worse. We have been using 12/17; he apparently just got the Levaquin yesterday this is 2 weeks after the fact. He has completed the Flagyl. The area over the left fifth metatarsal base still has protruding granulation tissue although it does not look quite as bad as it did some weeks ago. He has severe bilateral lymphedema although we have not been treating him for wounds on his legs this is definitely going to require compression. There was so much edema in the left I did not wish to put him in a total contact cast today. I am going to increase his compression from 3-4 layer. The area on the right lateral fifth met head actually look quite good and superficial. 12/23; patient arrived with callus on the right fifth met head and the substantial hyper granulated callused wound on the base of his fifth metatarsal. He says he is completing his Levaquin in 2 days but I do not think that adds up with what I gave him but I will have to double check this. We are using Hydrofera Blue on both areas. My plan is to put the left leg in a cast the week after New Year's 04/06/2020; patient's wounds about the same. Right lateral fifth metatarsal head and left lateral foot over the base of the fifth metatarsal. There is undermining on the left lateral foot which I removed before application of total contact cast continuing with Hydrofera Blue new. Patient tells me he was seen by endocrinology today lab work was done [Dr. Kerr]. Also wondering whether he was  referred to cardiology. I went over some lab work from previously does not have chronic renal failure certainly not nephrotic range proteinuria he does have very poorly controlled diabetes but this is not his most updated lab work. Hemoglobin A1c has been over 11 1/10; the patient had a considerable amount of leakage towards  mid part of his left foot with macerated skin however the wound surface looks better the area on the right lateral fifth met head is better as well. I am going to change the dressing on the left foot under the total contact cast to silver alginate, continue with Hydrofera Blue on the right. 1/20; patient was in the total contact cast for 10 days. Considerable amount of drainage although the skin around the wound does not look too bad on the left foot. The area on the right fifth metatarsal head is closed. Our nursing staff reports large amount of drainage out of the left lateral foot wound 1/25; continues with copious amounts of drainage described by our intake staff. PCR culture I did last week showed E. coli and Enterococcus faecalis and low quantities. Multiple resistance genes documented including extended spectrum beta lactamase, MRSA, MRSE, quinolone, tetracycline. The wound is not quite as good this week as it was 5 days ago but about the same size 2/3; continues with copious amounts of malodorous drainage per our intake nurse. The PCR culture I did 2 weeks ago showed E. coli and low quantities of Enterococcus. There were multiple resistance genes detected. I put Neosporin on him last week although this does not seem to have helped. The wound is slightly deeper today. Offloading continues to be an issue here although with the amount of drainage she has a total contact cast is just not going to work 2/10; moderate amount of drainage. Patient reports he cannot get his stocking on over the dressing. I told him we have to do that the nurse gave him suggestions on how to make this work. The wound is on the bottom and lateral part of his left foot. Is cultured predominantly grew low amounts of Enterococcus, E. coli and anaerobes. There were multiple resistance genes detected including extended spectrum beta lactamase, quinolone, tetracycline. I could not think of an easy oral combination to address  this so for now I am going to do topical antibiotics provided by [facility] I think the main agents here are vancomycin and an aminoglycoside. We have to be able to give him access to the wounds to get the topical antibiotic on 2/17; moderate amount of drainage this is unchanged. He has his Keystone topical antibiotic against the deep tissue culture organisms. He has been using this and changing the dressing daily. Silver alginate on the wound surface. 2/24; using Keystone antibiotic with silver alginate on the top. He had too much drainage for a total contact cast at one point although I think that is improving and I think in the next week or 2 it might be possible to replace a total contact cast I did not do this today. In general the wound surface looks healthy however he continues to have thick rims of skin and subcutaneous tissue around the wide area of the circumference which I debrided 06/04/2020 upon evaluation today patient appears to be doing well in regard to his wound. I do feel like he is showing signs of improvement. There is little bit of callus and dead tissue around the edges of the wound as well as what appears to be a little  bit of a sinus tract that is off to the side laterally I would perform debridement to clear that away today. 3/17; left lateral foot. The wound looks about the same as I remember. Not much depth surface looks healthy. No evidence of infection 3/25; left lateral foot. Wound surface looks about the same. Separating epithelium from the circumference. There really is no evidence of infection here however not making progress by my view 3/29; left lateral foot. Surface of the wound again looks reasonably healthy still thick skin and subcutaneous tissue around the wound margins. There is no evidence of infection. One of the concerns being brought up by the nurses has again the amount of drainage vis--vis continued use of a total contact cast 4/5; left lateral foot at  roughly the base of the fifth metatarsal. Nice healthy looking granulated tissue with rims of epithelialization. The overall wound measurements are not any better but the tissue looks healthy. The only concern is the amount of drainage although he has no surrounding maceration with what we have been doing recently to absorb fluid and protect his skin. He also has lymphedema. 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 He states that the kerlix/coban under the TCC rolled down and could not get it back up. He has been using an offloading scooter and has somehow hurt his right foot using this device. This happened last week. He states that the side of his right foot developed a blister and opened. The top of his foot also has a few small open wounds he thinks is due to his socks rubbing in his shoes. He has not been using any dressings to the wound. He denies purulent drainage, fever/chills or erythema to the wounds. 4/22; patient presents for 1 week follow-up. He developed new wounds to the right foot that were evaluated at last clinic visit. He continues to have a total contact cast to the left leg and he reports no issues. He has been using silver collagen to the right foot wounds with no issues. He denies purulent drainage, fever/chills or erythema to the right foot wounds. He has no complaints today 4/25; patient presents for 1 week follow-up. He has a total contact cast of the left leg and reports no issues. He has been using silver alginate to the right foot wound. He denies purulent drainage, fever/chills or erythema to the right foot wounds. 5/2 patient presents for 1 week follow-up. T contact cast on the left. The wound which is on the base of the  plantar foot at the base of the fifth metatarsal otal actually looks quite good and dimensions continue to gradually contract. HOWEVER the area on the right lateral fifth metatarsal head is much larger than what I remember from 2 weeks ago. Once more is he has significant levels of hypergranulation. Noteworthy that he had this same hyper granulated response on his wound on the left foot at one point in time. So much so that he I thought there was an underlying fluid collection. Based on this I think this just needs debridement. 5/9; the wound on the left actually continues to be gradually smaller with a healthy surface. Slight amount of drainage and maceration of the skin around but not too  bad. However he has a large wound over the right fifth metatarsal head very much in the same configuration as his left foot wound was initially. I used silver nitrate to address the hyper granulated tissue no mechanical debridement 5/16; area on the left foot did not look as healthy this week deeper thick surrounding macerated skin and subcutaneous tissue. oo The area on the right foot fifth met head was about the same oo The area on the right ankle that we identified last week is completely broken down into an open wound presumably a stocking rubbing issue 5/23; patient has been using a total contact cast to the left side. He has been using silver alginate underneath. He has also been using silver alginate to the right foot wounds. He has no complaints today. He denies any signs of infection. 5/31; the left-sided wound looks some better measure smaller surface granulation looks better. We have been using silver alginate under the total contact cast oo The large area on his right fifth met head and right dorsal foot look about the same still using silver alginate 6/6; neither side is good as I was hoping although the surface area dimensions are better. A lot of maceration on his left and right foot around the  wound edge. Area on the dorsal right foot looks better. He says he was traveling. I am not sure what does the amount of maceration around the plantar wounds may be drainage issues 6/13; in general the wound surfaces look quite good on both sides. Macerated skin and raised edges around the wound required debridement although in general especially on the left the surface area seems improved. oo The area on the right dorsal ankle is about the same I thought this would not be such a problem to close 6/20; not much change in either wound although the one on the right looks a little better. Both wounds have thick macerated edges to the skin requiring debridements. We have been using silver alginate. The area on the dorsal right ankle is still open I thought this would be closed. 6/28; patient comes in today with a marked deterioration in the right foot wound fifth met head. Wide area of exposed bone this is a drastic change from last time. The area on the left there we have been casting is stagnant. We have been using silver alginate in both wound areas. 7/5; bone culture I did for PCR last time was positive for Pseudomonas, group B strep, Enterococcus and Staph aureus. There was no suggestion of methicillin resistance or ampicillin resistant genes. This was resistant to tetracycline however He comes into the clinic today with the area over his right plantar fifth metatarsal head which had been doing so well 2 weeks ago completely necrotic feeling bone. I do not know that this is going to be salvageable. The left foot wound is certainly no smaller but it has a better surface and is superficial. 7/8; patient called in this morning to say that his total contact cast was rubbing against his foot. He states he is doing fine overall. He denies signs of infection. 7/12; continued deterioration in the wound over the right fifth metatarsal head crumbling bone. This is not going to be salvageable. The patient  agrees and wants to be referred to Dr. Doran Durand which we will attempt to arrange as soon as possible. I am going to continue him on antibiotics as long as that takes so I will renew those today. The area on the left  foot which is the base of the fifth metatarsal continues to look somewhat better. Healthy looking tissue no depth no debridement is necessary here. 7/20; the patient was kindly seen by Dr. Doran Durand of orthopedics on 10/19/2020. 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 Towards this end we have taken him out of the total contact cast on the left we will put him in a wrap with Hydrofera Blue. As I understand things surgery is planned for 7/21 7/27; patient had his surgery last Thursday. 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 Apparently everything went well we did not still disturb that today The area on the left foot actually looks quite good. He has been much less mobile which probably explains this he did not seem to do well in the total contact cast secondary to drainage and maceration I think. 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 Fortunately there is no evidence of active infection at this time. No fevers, chills, nausea, vomiting, or diarrhea. Overall I think that he is actually doing extremely well. Nonetheless I do believe that he is staying off of this more following the surgery in his right foot that is the reason the left is doing so great. Objective Constitutional Well-nourished and well-hydrated in no acute distress. Vitals Time Taken: 3:27 PM, Height: 77 in, Weight: 280 lbs, BMI: 33.2, Temperature: 98.5 F, Pulse: 97 bpm, Respiratory Rate: 20 breaths/min, Blood Pressure: 137/89 mmHg, Capillary Blood Glucose: 90 mg/dl. Respiratory normal breathing without  difficulty. Psychiatric this patient is able to make decisions and demonstrates good insight into disease process. Alert and Oriented x 3. pleasant and cooperative. General Notes: Upon inspection patient's wound bed showed signs of good granulation epithelization at this point on the left. With that being said I do believe that he is doing so well being off of this currently but we may need to consider a total contact cast on the left foot going forward if things do not seem to show signs of improvement. Especially when he begins to get back to school and work I think that is good to be a bigger issue potentially for him. Integumentary (Hair, Skin) Wound #3 status is Open. Original cause of wound was Trauma. The date acquired was: 10/02/2019. The wound has been in treatment 54 weeks. The wound is located on the Left,Lateral Foot. The wound measures 1.7cm length x 0.8cm width x 0.2cm depth; 1.068cm^2 area and 0.214cm^3 volume. There is Fat Layer (Subcutaneous Tissue) exposed. There is no tunneling noted, however, there is undermining starting at 5:00 and ending at 7:00 with a maximum distance of 0.2cm. There is a medium amount of serosanguineous drainage noted. The wound margin is distinct with the outline attached to the wound base. There is large (67-100%) pink granulation within the wound bed. There is no necrotic tissue within the wound bed. General Notes: thin callous noted to periwound. Wound #7 status is Healed - Epithelialized. Original cause of wound was Gradually Appeared. The date acquired was: 08/17/2020. The wound has been in treatment 12 weeks. The wound is located on the Right,Anterior Ankle. The wound measures 0cm length x 0cm width x 0cm depth; 0cm^2 area and 0cm^3 volume. There is Fat Layer (Subcutaneous Tissue) exposed. There is no tunneling or undermining noted. There is  a none present amount of drainage noted. The wound margin is distinct with the outline attached to the wound base.  There is no granulation within the wound bed. There is no necrotic tissue within the wound bed. 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 There was a Excisional Skin/Subcutaneous Tissue Debridement with a total area of 1.36 sq cm performed by Worthy Keeler, PA. With the following instrument(s): Curette to remove Non-Viable tissue/material. Material removed includes Callus, Subcutaneous Tissue, and Biofilm after achieving pain control using Other (Benzocaine). No specimens were taken. A time out was conducted at 16:44, prior to the start of the procedure. A Minimum amount of bleeding was controlled with Pressure. The procedure was tolerated well. Post Debridement Measurements: 1.7cm length x 0.8cm width x 0.2cm depth; 0.214cm^3 volume. Character of Wound/Ulcer Post Debridement is stable. Severity of Tissue Post Debridement is: Necrosis of muscle. Post procedure Diagnosis Wound #3: Same as Pre-Procedure Plan Follow-up Appointments: Return Appointment in 1 week. - with Dr. Dellia Nims Edema Control - Lymphedema / SCD / Other: Elevate legs to the level of the heart or above for 30 minutes daily and/or when sitting, a frequency of: - throughout the day Avoid standing for long periods of time. Exercise regularly Moisturize legs daily. - right leg every night before bed. Compression stocking or Garment 20-30 mm/Hg pressure to: - Apply to right leg in the morning and remove at night. Off-Loading: Open toe surgical shoe to: - bilateral feet WOUND #3: - Foot Wound Laterality: Left, Lateral Cleanser: Soap and  Water Every Other Day/30 Days Discharge Instructions: May shower and wash wound with dial antibacterial soap and water prior to dressing change. Peri-Wound Care: Zinc Oxide Ointment 30g tube Every Other Day/30 Days Discharge Instructions: Apply Zinc Oxide to periwound with each dressing change as needed. Prim Dressing: Hydrofera Blue Classic Foam, 2x2 in (Generic) Every Other Day/30 Days ary Discharge Instructions: Moisten with saline prior to applying to wound bed Secondary Dressing: Woven Gauze Sponge, Non-Sterile 4x4 in (Generic) Every Other Day/30 Days Discharge Instructions: Apply over primary dressing as directed. Secondary Dressing: ABD Pad, 8x10 (Generic) Every Other Day/30 Days Discharge Instructions: T down before applying kerlix. Apply over primary dressing as directed. ape Secured With: Coban Self-Adherent Wrap 4x5 (in/yd) Every Other Day/30 Days Discharge Instructions: Secure with Coban as directed. Secured With: [employer], 4.5x3.1 (in/yd) Every Other Day/30 Days Discharge Instructions: Secure with Kerlix as directed. 1. Would recommend currently that we going continue with the wound care measures as before and the patient is in agreement with plan. This includes the use of the Hydrofera Blue dressing on the left foot I think this is the right way to go. 2. Muscle and recommend at this time that we have the patient continue with the offloading is much as he can. If he is getting get up and move around using the healing sandal or even better potentially the cam walker boot may be a good way to go. 3. Also can recommend we continue to monitor for any signs of worsening or infection if anything changes he should let us know. We will see patient back for reevaluation in  1 week here in the clinic. If anything worsens or changes patient will contact our office for additional recommendations. Electronic Signature(s) Signed: 11/09/2020 4:57:19 PM By: Worthy Keeler PA-C Entered  By: Worthy Keeler on 11/09/2020 16:57:19 -------------------------------------------------------------------------------- SuperBill Details Patient Name: Date of Service: [name], [name] 11/09/2020 Medical Record Number: [id number] Patient Account Number: [id number] Date of Birth/Sex: Treating RN: 23-Jul-1985 (34 y.o. [name] Primary Care Provider: [name] Other Clinician: Referring Provider: Treating Provider/Extender: Jennye Moccasin in Treatment: 54 Diagnosis Coding ICD-10 Codes Code Description E11.621 Type 2 diabetes mellitus with foot ulcer L97.528 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ity L97.518 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ity Facility Procedures CPT4 Code: [id number] Description: 05110 - DEB SUBQ TISSUE 20 SQ CM/< ICD-10 Diagnosis Description L97.528 Non-pressure chronic ulcer of other part of left foot with other specified seve Modifier: rity Quantity: 1 Physician Procedures : CPT4 Code Description Modifier [id number] - WC PHYS SUBQ TISS 20 SQ CM ICD-10 Diagnosis Description L97.528 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ity Quantity: 1 Electronic Signature(s) Signed: 11/09/2020 4:57:28 PM By: Worthy Keeler PA-C Entered By: Worthy Keeler on 11/09/2020 16:57:28

## 2020-11-10 NOTE — Progress Notes (Signed)
[name], [name] ([id number]) Visit Report for 11/09/2020 Arrival Information Details Patient Name: Date of Service: [name], [name] 11/09/2020 3:15 PM Medical Record Number: [id number] Patient Account Number: [id number] Date of Birth/Sex: Treating RN: 01/05/1986 (35 y.o. M) Deaton, Bobbi Primary Care Provider: [name], Ronald Other Clinician: Referring Provider: Treating Provider/Extender: Stone III, Hoyt [name], Ronald Weeks in Treatment: 54 Visit Information History Since Last Visit Added or deleted any medications: No Patient Arrived: Ambulatory Any new allergies or adverse reactions: No Arrival Time: 15:27 Had a fall or experienced change in No Accompanied By: self activities of daily living that may affect Transfer Assistance: None risk of falls: Patient Identification Verified: Yes Signs or symptoms of abuse/neglect since No Secondary Verification Process Completed: Yes last visito Patient Requires Transmission-Based Precautions: No Hospitalized since last visit: No Patient Has Alerts: No Implantable device outside of the clinic No excluding cellular tissue based products placed in the center since last visit: Has Dressing in Place as Prescribed: Yes Has Footwear/Offloading in Place as Yes Prescribed: Left: Surgical Shoe with Pressure Relief Insole Right: Surgical Shoe with Pressure Relief Insole Pain Present Now: No Electronic Signature(s) Signed: 11/09/2020 5:35:12 PM By: Deaton, Bobbi Entered By: Deaton, Bobbi on 11/09/2020 15:31:18 -------------------------------------------------------------------------------- Encounter Discharge Information Details Patient Name: Date of Service: [name], [name] 11/09/2020 3:15 PM Medical Record Number: [id number] Patient Account Number: [id number] Date of Birth/Sex: Treating RN: 10/13/1985 (35 y.o. M) Lynch, Shatara Primary Care Provider: [name], Ronald Other Clinician: Referring Provider: Treating Provider/Extender: Stone III,  Hoyt [name], Ronald Weeks in Treatment: 54 Encounter Discharge Information Items Post Procedure Vitals Discharge Condition: Stable Temperature (F): 98.5 Ambulatory Status: Ambulatory Pulse (bpm): 97 Discharge Destination: Home Respiratory Rate (breaths/min): 20 Transportation: Private Auto Blood Pressure (mmHg): 137/89 Accompanied By: alone Schedule Follow-up Appointment: Yes Clinical Summary of Care: Patient Declined Electronic Signature(s) Signed: 11/09/2020 5:45:52 PM By: Lynch, Shatara RN, BSN Entered By: Lynch, Shatara on 11/09/2020 17:10:11 -------------------------------------------------------------------------------- Lower Extremity Assessment Details Patient Name: Date of Service: [name], [name] 11/09/2020 3:15 PM Medical Record Number: [id number] Patient Account Number: [id number] Date of Birth/Sex: Treating RN: 06/12/1985 (35 y.o. M) Deaton, Bobbi Primary Care Provider: [name], Ronald Other Clinician: Referring Provider: Treating Provider/Extender: Stone III, Hoyt [name], Ronald Weeks in Treatment: 54 Edema Assessment Assessed: [Left: Yes] [Right: Yes] Edema: [Left: Yes] [Right: Yes] Calf Left: Right: Point of Measurement: 48 cm From Medial Instep 47 cm 47 cm Ankle Left: Right: Point of Measurement: 11 cm From Medial Instep 27.5 cm 29.5 cm Vascular Assessment Pulses: Dorsalis Pedis Palpable: [Left:Yes] [Right:Yes] Electronic Signature(s) Signed: 11/09/2020 5:35:12 PM By: Deaton, Bobbi Entered By: Deaton, Bobbi on 11/09/2020 15:40:09 -------------------------------------------------------------------------------- Multi-Disciplinary Care Plan Details Patient Name: Date of Service: [name], [name] 11/09/2020 3:15 PM Medical Record Number: [id number] Patient Account Number: [id number] Date of Birth/Sex: Treating RN: 02/06/1986 (35 y.o. M) Barnhart, Jodi Primary Care Provider: [name], Ronald Other Clinician: Referring Provider: Treating  Provider/Extender: Stone III, Hoyt [name], Ronald Weeks in Treatment: 54 Multidisciplinary Care Plan reviewed with physician Active Inactive Nutrition Nursing Diagnoses: Imbalanced nutrition Potential for alteratiion in Nutrition/Potential for imbalanced nutrition Goals: Patient/caregiver agrees to and verbalizes understanding of need to use nutritional supplements and/or vitamins as prescribed Date Initiated: 10/24/2019 Date Inactivated: 04/06/2020 Target Resolution Date: 04/03/2020 Goal Status: Met Patient/caregiver will maintain therapeutic glucose control Date Initiated: 10/24/2019 Target Resolution Date: 11/20/2020 Goal Status: Active Interventions: Assess HgA1c results as ordered upon admission and as needed Assess patient nutrition upon admission and as needed per   policy Provide education on elevated blood sugars and impact on wound healing Provide education on nutrition Treatment Activities: Education provided on Nutrition : 10/13/2020 Notes: Wound/Skin Impairment Nursing Diagnoses: Impaired tissue integrity Knowledge deficit related to ulceration/compromised skin integrity Goals: Patient/caregiver will verbalize understanding of skin care regimen Date Initiated: 10/24/2019 Target Resolution Date: 11/20/2020 Goal Status: Active Ulcer/skin breakdown will have a volume reduction of 30% by week 4 Date Initiated: 10/24/2019 Date Inactivated: 01/16/2020 Target Resolution Date: 01/10/2020 Unmet Reason: no change in Goal Status: Unmet measurements. Interventions: Assess patient/caregiver ability to obtain necessary supplies Assess patient/caregiver ability to perform ulcer/skin care regimen upon admission and as needed Assess ulceration(s) every visit Provide education on ulcer and skin care Notes: Electronic Signature(s) Signed: 11/09/2020 4:30:53 PM By: Barnhart, Jodi Entered By: Barnhart, Jodi on 11/09/2020  16:30:53 -------------------------------------------------------------------------------- Pain Assessment Details Patient Name: Date of Service: [name], [name] 11/09/2020 3:15 PM Medical Record Number: [id number] Patient Account Number: [id number] Date of Birth/Sex: Treating RN: 04/27/1985 (35 y.o. M) Deaton, Bobbi Primary Care Provider: [name], Ronald Other Clinician: Referring Provider: Treating Provider/Extender: Stone III, Hoyt [name], Ronald Weeks in Treatment: 54 Active Problems Location of Pain Severity and Description of Pain Patient Has Paino No Site Locations Rate the pain. Rate the pain. Current Pain Level: 0 Pain Management and Medication Current Pain Management: Medication: No Cold Application: No Rest: No Massage: No Activity: No T.E.N.S.: No Heat Application: No Leg drop or elevation: No Is the Current Pain Management Adequate: Adequate How does your wound impact your activities of daily livingo Sleep: No Bathing: No Appetite: No Relationship With Others: No Bladder Continence: No Emotions: No Bowel Continence: No Work: No Toileting: No Drive: No Dressing: No Hobbies: No Electronic Signature(s) Signed: 11/09/2020 5:35:12 PM By: Deaton, Bobbi Entered By: Deaton, Bobbi on 11/09/2020 15:35:36 -------------------------------------------------------------------------------- Patient/Caregiver Education Details Patient Name: Date of Service: [name], [name] 8/8/2022andnbsp3:15 PM Medical Record Number: [id number] Patient Account Number: [id number] Date of Birth/Gender: Treating RN: 09/08/1985 (35 y.o. M) Barnhart, Jodi Primary Care Physician: [name], Ronald Other Clinician: Referring Physician: Treating Physician/Extender: Stone III, Hoyt [name], Ronald Weeks in Treatment: 54 Education Assessment Education Provided To: Patient Education Topics Provided Elevated Blood Sugar/ Impact on Healing: Methods: Explain/Verbal Responses: State  content correctly Offloading: Methods: Explain/Verbal, Printed Responses: State content correctly Wound/Skin Impairment: Methods: Demonstration, Explain/Verbal, Printed Responses: State content correctly Electronic Signature(s) Signed: 11/09/2020 5:53:47 PM By: Barnhart, Jodi Entered By: Barnhart, Jodi on 11/09/2020 16:49:00 -------------------------------------------------------------------------------- Wound Assessment Details Patient Name: Date of Service: [name], [name] 11/09/2020 3:15 PM Medical Record Number: [id number] Patient Account Number: [id number] Date of Birth/Sex: Treating RN: 12/08/1985 (35 y.o. M) Deaton, Bobbi Primary Care Provider: [name], Ronald Other Clinician: Referring Provider: Treating Provider/Extender: Stone III, Hoyt [name], Ronald Weeks in Treatment: 54 Wound Status Wound Number: 3 Primary Etiology: Diabetic Wound/Ulcer of the Lower Extremity Wound Location: Left, Lateral Foot Wound Status: Open Wounding Event: Trauma Comorbid History: Type II Diabetes Date Acquired: 10/02/2019 Weeks Of Treatment: 54 Clustered Wound: No Photos Wound Measurements Length: (cm) 1.7 Width: (cm) 0.8 Depth: (cm) 0.2 Area: (cm) 1.068 Volume: (cm) 0.214 % Reduction in Area: 35.2% % Reduction in Volume: -29.7% Epithelialization: Small (1-33%) Tunneling: No Undermining: Yes Starting Position (o'clock): 5 Ending Position (o'clock): 7 Maximum Distance: (cm) 0.2 Wound Description Classification: Grade 2 Wound Margin: Distinct, outline attached Exudate Amount: Medium Exudate Type: Serosanguineous Exudate Color: red, brown Foul Odor After Cleansing: No Slough/Fibrino No Wound Bed Granulation Amount: Large (67-100%) Exposed Structure Granulation Quality:   Pink Fascia Exposed: No Necrotic Amount: None Present (0%) Fat Layer (Subcutaneous Tissue) Exposed: Yes Tendon Exposed: No Muscle Exposed: No Joint Exposed: No Bone Exposed: No Assessment Notes thin  callous noted to periwound. Treatment Notes Wound #3 (Foot) Wound Laterality: Left, Lateral Cleanser Soap and Water Discharge Instruction: May shower and wash wound with dial antibacterial soap and water prior to dressing change. Peri-Wound Care Zinc Oxide Ointment 30g tube Discharge Instruction: Apply Zinc Oxide to periwound with each dressing change as needed. Topical Primary Dressing Hydrofera Blue Classic Foam, 2x2 in Discharge Instruction: Moisten with saline prior to applying to wound bed Secondary Dressing Woven Gauze Sponge, Non-Sterile 4x4 in Discharge Instruction: Apply over primary dressing as directed. ABD Pad, 8x10 Discharge Instruction: T down before applying kerlix. Apply over primary dressing as directed. ape Secured With Coban Self-Adherent Wrap 4x5 (in/yd) Discharge Instruction: Secure with Coban as directed. Kerlix Roll Sterile, 4.5x3.1 (in/yd) Discharge Instruction: Secure with Kerlix as directed. Compression Wrap Compression Stockings Add-Ons Electronic Signature(s) Signed: 11/09/2020 5:35:12 PM By: Deaton, Bobbi Entered By: Deaton, Bobbi on 11/09/2020 15:40:38 -------------------------------------------------------------------------------- Wound Assessment Details Patient Name: Date of Service: [name], [name] 11/09/2020 3:15 PM Medical Record Number: [id number] Patient Account Number: [id number] Date of Birth/Sex: Treating RN: 01/18/1986 (35 y.o. M) Deaton, Bobbi Primary Care Provider: [name], Ronald Other Clinician: Referring Provider: Treating Provider/Extender: Stone III, Hoyt [name], Ronald Weeks in Treatment: 54 Wound Status Wound Number: 7 Primary Etiology: Diabetic Wound/Ulcer of the Lower Extremity Wound Location: Right, Anterior Ankle Wound Status: Healed - Epithelialized Wounding Event: Gradually Appeared Comorbid History: Type II Diabetes Date Acquired: 08/17/2020 Weeks Of Treatment: 12 Clustered Wound: No Photos Wound  Measurements Length: (cm) Width: (cm) Depth: (cm) Area: (cm) Volume: (cm) 0 % Reduction in Area: 100% 0 % Reduction in Volume: 100% 0 Epithelialization: Large (67-100%) 0 Tunneling: No 0 Undermining: No Wound Description Classification: Grade 2 Wound Margin: Distinct, outline attached Exudate Amount: None Present Foul Odor After Cleansing: No Slough/Fibrino No Wound Bed Granulation Amount: None Present (0%) Exposed Structure Necrotic Amount: None Present (0%) Fascia Exposed: No Fat Layer (Subcutaneous Tissue) Exposed: Yes Tendon Exposed: No Muscle Exposed: No Joint Exposed: No Bone Exposed: No Electronic Signature(s) Signed: 11/09/2020 5:35:12 PM By: Deaton, Bobbi Entered By: Deaton, Bobbi on 11/09/2020 15:41:12 -------------------------------------------------------------------------------- Vitals Details Patient Name: Date of Service: [name], [name] 11/09/2020 3:15 PM Medical Record Number: [id number] Patient Account Number: [id number] Date of Birth/Sex: Treating RN: 06/04/1985 (35 y.o. M) Deaton, Bobbi Primary Care Provider: [name], Ronald Other Clinician: Referring Provider: Treating Provider/Extender: Stone III, Hoyt [name], Ronald Weeks in Treatment: 54 Vital Signs Time Taken: 15:27 Temperature (°F): 98.5 Height (in): 77 Pulse (bpm): 97 Weight (lbs): 280 Respiratory Rate (breaths/min): 20 Body Mass Index (BMI): 33.2 Blood Pressure (mmHg): 137/89 Capillary Blood Glucose (mg/dl): 90 Reference Range: 80 - 120 mg / dl Electronic Signature(s) Signed: 11/09/2020 5:35:12 PM By: Deaton, Bobbi Entered By: Deaton, Bobbi on 11/09/2020 15:31:54 

## 2020-11-12 ENCOUNTER — Encounter (HOSPITAL_BASED_OUTPATIENT_CLINIC_OR_DEPARTMENT_OTHER): Payer: BC Managed Care – PPO | Admitting: Internal Medicine

## 2020-11-17 ENCOUNTER — Encounter (HOSPITAL_BASED_OUTPATIENT_CLINIC_OR_DEPARTMENT_OTHER): Payer: BC Managed Care – PPO | Admitting: Internal Medicine

## 2020-11-17 ENCOUNTER — Other Ambulatory Visit: Payer: Self-pay

## 2020-11-17 DIAGNOSIS — E11621 Type 2 diabetes mellitus with foot ulcer: Secondary | ICD-10-CM | POA: Diagnosis not present

## 2020-11-17 NOTE — Progress Notes (Signed)
[name] ([id number]) Visit Report for 11/17/2020 Arrival Information Details Patient Name: Date of Service: [name] 11/17/2020 2:45 PM Medical Record Number: [id number] Patient Account Number: [id number] Date of Birth/Sex: Treating RN: 11/09/1985 (35 y.o. [name] Primary Care Anniyah Mood: [name] Other Clinician: Referring Yeshaya Vath: Treating Bethanny Toelle/Extender: [name] in Treatment: 30 Visit Information History Since Last Visit Added or deleted any medications: No Patient Arrived: Ambulatory Any new allergies or adverse reactions: No Arrival Time: 14:58 Had a fall or experienced change in No Transfer Assistance: None activities of daily living that may affect Patient Identification Verified: Yes risk of falls: Secondary Verification Process Completed: Yes Signs or symptoms of abuse/neglect since No Patient Requires Transmission-Based Precautions: No last visito Patient Has Alerts: No Hospitalized since last visit: No Implantable device outside of the clinic No excluding cellular tissue based products placed in the center since last visit: Has Dressing in Place as Prescribed: Yes Has Footwear/Offloading in Place as Yes Prescribed: Left: Surgical Shoe with Pressure Relief Insole Right: Surgical Shoe with Pressure Relief Insole Pain Present Now: No Electronic Signature(s) Signed: 11/17/2020 5:08:22 PM By: [name] Entered By: [name] on 11/17/2020 15:02:48 -------------------------------------------------------------------------------- Clinic Level of Care Assessment Details Patient Name: Date of Service: [name] 11/17/2020 2:45 PM Medical Record Number: [id number] Patient Account Number: [id number] Date of Birth/Sex: Treating RN: [birth year] (35 y.o. [name] Primary Care [name]: [name] Other Clinician: Referring [name]: Treating Constant [place]/Extender: [name] in Treatment: 55 Clinic Level of Care Assessment Items TOOL 4 Quantity Score X- 1 0 Use when only an EandM is performed on FOLLOW-UP visit ASSESSMENTS - Nursing Assessment / Reassessment X- 1 10 Reassessment of Co-morbidities (includes updates in patient status) X- 1 5 Reassessment of Adherence to Treatment Plan ASSESSMENTS - Wound and Skin A ssessment / Reassessment X - Simple Wound Assessment / Reassessment - one wound 1 5 [] - 0 Complex Wound Assessment / Reassessment - multiple wounds [] - 0 Dermatologic / Skin Assessment (not related to wound area) ASSESSMENTS - Focused Assessment [] - 0 Circumferential Edema Measurements - multi extremities [] - 0 Nutritional Assessment / Counseling / Intervention [] - 0 Lower Extremity Assessment (monofilament, tuning fork, pulses) [] - 0 Peripheral Arterial Disease Assessment (using hand held doppler) ASSESSMENTS - Ostomy and/or Continence Assessment and Care [] - 0 Incontinence Assessment and Management [] - 0 Ostomy Care Assessment and Management (repouching, etc.) PROCESS - Coordination of Care [] - 0 Simple Patient / Family Education for ongoing care X- 1 20 Complex (extensive) Patient / Family Education for ongoing care X- 1 10 Staff obtains Programmer, systems, Records, T Results / Process Orders est [] - 0 Staff telephones HHA, Nursing Homes / Clarify orders / etc [] - 0 Routine Transfer to another Facility (non-emergent condition) [] - 0 Routine Hospital Admission (non-emergent condition) [] - 0 New Admissions / Biomedical engineer / Ordering NPWT Apligraf, etc. , [] - 0 Emergency Hospital Admission (emergent condition) [] - 0 Simple Discharge Coordination [] - 0 Complex (extensive) Discharge Coordination PROCESS - Special Needs [] - 0 Pediatric / Minor Patient Management [] - 0 Isolation Patient Management [] - 0 Hearing / Language / Visual special needs [] - 0 Assessment of Community assistance  (transportation, D/C planning, etc.) [] - 0 Additional assistance / Altered mentation [] - 0 Support Surface(s) Assessment (bed, cushion, seat, etc.) INTERVENTIONS - Wound Cleansing / Measurement X - Simple  Wound Cleansing - one wound 1 5 [] - 0 Complex Wound Cleansing - multiple wounds X- 1 5 Wound Imaging (photographs - any number of wounds) [] - 0 Wound Tracing (instead of photographs) X- 1 5 Simple Wound Measurement - one wound [] - 0 Complex Wound Measurement - multiple wounds INTERVENTIONS - Wound Dressings [] - 0 Small Wound Dressing one or multiple wounds X- 1 15 Medium Wound Dressing one or multiple wounds [] - 0 Large Wound Dressing one or multiple wounds [] - 0 Application of Medications - topical [] - 0 Application of Medications - injection INTERVENTIONS - Miscellaneous [] - 0 External ear exam [] - 0 Specimen Collection (cultures, biopsies, blood, body fluids, etc.) [] - 0 Specimen(s) / Culture(s) sent or taken to Lab for analysis [] - 0 Patient Transfer (multiple staff / Hoyer Lift / Similar devices) [] - 0 Simple Staple / Suture removal (25 or less) [] - 0 Complex Staple / Suture removal (26 or more) [] - 0 Hypo / Hyperglycemic Management (close monitor of Blood Glucose) [] - 0 Ankle / Brachial Index (ABI) - do not check if billed separately X- 1 5 Vital Signs Has the patient been seen at the hospital within the last three years: Yes Total Score: 85 Level Of Care: New/Established - Level 3 Electronic Signature(s) Signed: 11/17/2020 5:08:22 PM By: [name] Entered By: [name] on 11/17/2020 15:59:06 -------------------------------------------------------------------------------- Encounter Discharge Information Details Patient Name: Date of Service: [name], [name] 11/17/2020 2:45 PM Medical Record Number: [id number] Patient Account Number: [id number] Date of Birth/Sex: Treating RN: 12/12/1985 (35 y.o. M) [name] Primary Care  Provider: [name], Ronald Other Clinician: Referring Provider: Treating Provider/Extender: [name] [name], Ronald Weeks in Treatment: 55 Encounter Discharge Information Items Discharge Condition: Stable Ambulatory Status: Ambulatory Discharge Destination: Home Transportation: Private Auto Schedule Follow-up Appointment: Yes Clinical Summary of Care: Provided on 11/17/2020 Form Type Recipient Paper Patient Patient Electronic Signature(s) Signed: 11/17/2020 5:08:22 PM By: [name] Entered By: [name] on 11/17/2020 16:00:24 -------------------------------------------------------------------------------- Lower Extremity Assessment Details Patient Name: Date of Service: [name], [name] 11/17/2020 2:45 PM Medical Record Number: [id number] Patient Account Number: [id number] Date of Birth/Sex: Treating RN: 02/10/1986 (35 y.o. M) [name] Primary Care Provider: [name], Ronald Other Clinician: Referring Provider: Treating Provider/Extender: [name] [name], Ronald Weeks in Treatment: 55 Edema Assessment Assessed: [Left: Yes] [Right: No] Edema: [Left: Ye] [Right: s] Calf Left: Right: Point of Measurement: 48 cm From Medial Instep 45 cm Ankle Left: Right: Point of Measurement: 11 cm From Medial Instep 28.5 cm Vascular Assessment Pulses: Dorsalis Pedis Palpable: [Left:Yes] Electronic Signature(s) Signed: 11/17/2020 5:08:22 PM By: [name] Entered By: [name] on 11/17/2020 15:10:55 -------------------------------------------------------------------------------- Multi Wound Chart Details Patient Name: Date of Service: [name], [name] 11/17/2020 2:45 PM Medical Record Number: [id number] Patient Account Number: [id number] Date of Birth/Sex: Treating RN: 12/05/1985 (35 y.o. M) Deaton, Bobbi Primary Care Provider: [name], Ronald Other Clinician: Referring Provider: Treating Provider/Extender: [name] [name], Ronald Weeks in  Treatment: 55 Vital Signs Height(in): 77 Capillary Blood Glucose(mg/dl): 134 Weight(lbs): 280 Pulse(bpm): 94 Body Mass Index(BMI): 33 Blood Pressure(mmHg): 143/96 Temperature(°F): 99.1 Respiratory Rate(breaths/min): 18 Photos: [N/A:N/A] Left, Lateral Foot N/A N/A Wound Location: Trauma N/A N/A Wounding Event: Diabetic Wound/Ulcer of the Lower N/A N/A Primary Etiology: Extremity Type II Diabetes N/A N/A Comorbid History: 10/02/2019 N/A N/A Date Acquired: 55 N/A N/A Weeks of Treatment: Open N/A N/A Wound Status: 1.6x0.7x0.2 N/A N/A Measurements L x   W x D (cm) 0.88 N/A N/A A (cm) : rea 0.176 N/A N/A Volume (cm) : 46.60% N/A N/A % Reduction in A rea: -6.70% N/A N/A % Reduction in Volume: 1 Starting Position 1 (o'clock): 4 Ending Position 1 (o'clock): 2 Maximum Distance 1 (cm): Yes N/A N/A Undermining: Grade 2 N/A N/A Classification: Medium N/A N/A Exudate A mount: Serosanguineous N/A N/A Exudate Type: red, brown N/A N/A Exudate Color: Distinct, outline attached N/A N/A Wound Margin: Large (67-100%) N/A N/A Granulation A mount: Pink N/A N/A Granulation Quality: None Present (0%) N/A N/A Necrotic Amount: Fat Layer (Subcutaneous Tissue): Yes N/A N/A Exposed Structures: Fascia: No Tendon: No Muscle: No Joint: No Bone: No Small (1-33%) N/A N/A Epithelialization: Treatment Notes Wound #3 (Foot) Wound Laterality: Left, Lateral Cleanser Soap and Water Discharge Instruction: May shower and wash wound with dial antibacterial soap and water prior to dressing change. Peri-Wound Care Zinc Oxide Ointment 30g tube Discharge Instruction: Apply Zinc Oxide to periwound with each dressing change as needed. Topical Primary Dressing Hydrofera Blue Classic Foam, 2x2 in Discharge Instruction: Moisten with saline prior to applying to wound bed Secondary Dressing Woven Gauze Sponge, Non-Sterile 4x4 in Discharge Instruction: Apply over primary dressing as  directed. ABD Pad, 8x10 Discharge Instruction: T down before applying kerlix. Apply over primary dressing as directed. ape Secured With Coban Self-Adherent Wrap 4x5 (in/yd) Discharge Instruction: Secure with Coban as directed. Kerlix Roll Sterile, 4.5x3.1 (in/yd) Discharge Instruction: Secure with Kerlix as directed. Compression Wrap Compression Stockings Add-Ons Electronic Signature(s) Signed: 11/17/2020 4:53:39 PM By: Robson, Michael MD Signed: 11/17/2020 5:19:53 PM By: Deaton, Bobbi Entered By: [name] on 11/17/2020 16:29:47 -------------------------------------------------------------------------------- Multi-Disciplinary Care Plan Details Patient Name: Date of Service: [name], [name] 11/17/2020 2:45 PM Medical Record Number: [id number] Patient Account Number: [id number] Date of Birth/Sex: Treating RN: 01/01/1986 (35 y.o. M) [name] Primary Care Wilbern Pennypacker: [name], Ronald Other Clinician: Referring Sherman Lipuma: Treating Shawnte Demarest/Extender: [name] [name], Ronald Weeks in Treatment: 55 Multidisciplinary Care Plan reviewed with physician Active Inactive Nutrition Nursing Diagnoses: Imbalanced nutrition Potential for alteratiion in Nutrition/Potential for imbalanced nutrition Goals: Patient/caregiver agrees to and verbalizes understanding of need to use nutritional supplements and/or vitamins as prescribed Date Initiated: 10/24/2019 Date Inactivated: 04/06/2020 Target Resolution Date: 04/03/2020 Goal Status: Met Patient/caregiver will maintain therapeutic glucose control Date Initiated: 10/24/2019 Target Resolution Date: 12/22/2020 Goal Status: Active Interventions: Assess HgA1c results as ordered upon admission and as needed Assess patient nutrition upon admission and as needed per policy Provide education on elevated blood sugars and impact on wound healing Provide education on nutrition Treatment Activities: Education provided on Nutrition :  09/29/2020 Notes: 11/17/20: Glucose control ongoing issue, target date extended. Wound/Skin Impairment Nursing Diagnoses: Impaired tissue integrity Knowledge deficit related to ulceration/compromised skin integrity Goals: Patient/caregiver will verbalize understanding of skin care regimen Date Initiated: 10/24/2019 Target Resolution Date: 12/21/2020 Goal Status: Active Ulcer/skin breakdown will have a volume reduction of 30% by week 4 Date Initiated: 10/24/2019 Date Inactivated: 01/16/2020 Target Resolution Date: 01/10/2020 Unmet Reason: no change in Goal Status: Unmet measurements. Interventions: Assess patient/caregiver ability to obtain necessary supplies Assess patient/caregiver ability to perform ulcer/skin care regimen upon admission and as needed Assess ulceration(s) every visit Provide education on ulcer and skin care Notes: 11/17/20: Wound care regimen continues Electronic Signature(s) Signed: 11/17/2020 5:08:22 PM By: [name] Entered By: [name] on 11/17/2020 15:12:18 -------------------------------------------------------------------------------- Pain Assessment Details Patient Name: Date of Service: [name], [name] 11/17/2020 2:45 PM Medical Record Number: [id number] Patient Account   Number: [id number] Date of Birth/Sex: Treating RN: 10/22/1985 (35 y.o. M) [name] Primary Care Provider: [name], Ronald Other Clinician: Referring Provider: Treating Provider/Extender: [name] [name], Ronald Weeks in Treatment: 55 Active Problems Location of Pain Severity and Description of Pain Patient Has Paino No Site Locations Pain Management and Medication Current Pain Management: Electronic Signature(s) Signed: 11/17/2020 5:08:22 PM By: [name] Entered By: [name] on 11/17/2020 15:03:30 -------------------------------------------------------------------------------- Patient/Caregiver Education Details Patient Name: Date of  Service: [name], [name] 8/16/2022andnbsp2:45 PM Medical Record Number: [id number] Patient Account Number: [id number] Date of Birth/Gender: Treating RN: 11/21/1985 (35 y.o. M) [name] Primary Care Physician: [name], Ronald Other Clinician: Referring Physician: Treating Physician/Extender: [name] [name], Ronald Weeks in Treatment: 55 Education Assessment Education Provided To: Patient Education Topics Provided Elevated Blood Sugar/ Impact on Healing: Methods: Explain/Verbal, Printed Responses: State content correctly Offloading: Methods: Explain/Verbal, Printed Responses: State content correctly Wound/Skin Impairment: Methods: Explain/Verbal, Printed Responses: State content correctly Electronic Signature(s) Signed: 11/17/2020 5:08:22 PM By: [name] Entered By: [name] on 11/17/2020 15:12:47 -------------------------------------------------------------------------------- Wound Assessment Details Patient Name: Date of Service: [name], [name] 11/17/2020 2:45 PM Medical Record Number: [id number] Patient Account Number: [id number] Date of Birth/Sex: Treating RN: 09/02/1985 (35 y.o. M) [name] Primary Care Provider: [name], Ronald Other Clinician: Referring Provider: Treating Provider/Extender: [name] [name], Ronald Weeks in Treatment: 55 Wound Status Wound Number: 3 Primary Etiology: Diabetic Wound/Ulcer of the Lower Extremity Wound Location: Left, Lateral Foot Wound Status: Open Wounding Event: Trauma Comorbid History: Type II Diabetes Date Acquired: 10/02/2019 Weeks Of Treatment: 55 Clustered Wound: No Photos Wound Measurements Length: (cm) 1.6 Width: (cm) 0.7 Depth: (cm) 0.2 Area: (cm) 0.88 Volume: (cm) 0.176 % Reduction in Area: 46.6% % Reduction in Volume: -6.7% Epithelialization: Small (1-33%) Tunneling: No Undermining: Yes Starting Position (o'clock): 1 Ending Position (o'clock): 4 Maximum Distance:  (cm) 2 Wound Description Classification: Grade 2 Wound Margin: Distinct, outline attached Exudate Amount: Medium Exudate Type: Serosanguineous Exudate Color: red, brown Foul Odor After Cleansing: No Slough/Fibrino No Wound Bed Granulation Amount: Large (67-100%) Exposed Structure Granulation Quality: Pink Fascia Exposed: No Necrotic Amount: None Present (0%) Fat Layer (Subcutaneous Tissue) Exposed: Yes Tendon Exposed: No Muscle Exposed: No Joint Exposed: No Bone Exposed: No Treatment Notes Wound #3 (Foot) Wound Laterality: Left, Lateral Cleanser Soap and Water Discharge Instruction: May shower and wash wound with dial antibacterial soap and water prior to dressing change. Peri-Wound Care Zinc Oxide Ointment 30g tube Discharge Instruction: Apply Zinc Oxide to periwound with each dressing change as needed. Topical Primary Dressing Hydrofera Blue Classic Foam, 2x2 in Discharge Instruction: Moisten with saline prior to applying to wound bed Secondary Dressing Woven Gauze Sponge, Non-Sterile 4x4 in Discharge Instruction: Apply over primary dressing as directed. ABD Pad, 8x10 Discharge Instruction: T down before applying kerlix. Apply over primary dressing as directed. ape Secured With Coban Self-Adherent Wrap 4x5 (in/yd) Discharge Instruction: Secure with Coban as directed. Kerlix Roll Sterile, 4.5x3.1 (in/yd) Discharge Instruction: Secure with Kerlix as directed. Compression Wrap Compression Stockings Add-Ons Electronic Signature(s) Signed: 11/17/2020 5:08:22 PM By: [name] Entered By: [name] on 11/17/2020 15:09:33 -------------------------------------------------------------------------------- Vitals Details Patient Name: Date of Service: [name], [name] 11/17/2020 2:45 PM Medical Record Number: [id number] Patient Account Number: [id number] Date of Birth/Sex: Treating RN: 08/09/1985 (35 y.o. M) [name] Primary Care Provider: [name],  Ronald Other Clinician: Referring Provider: Treating Provider/Extender: [name] [name], Ronald Weeks in Treatment: 55 Vital Signs Time Taken: 15:02 Temperature (°F): 99.1   Height (in): 77 Pulse (bpm): 94 Weight (lbs): 280 Respiratory Rate (breaths/min): 18 Body Mass Index (BMI): 33.2 Blood Pressure (mmHg): 143/96 Capillary Blood Glucose (mg/dl): 134 Reference Range: 80 - 120 mg / dl Electronic Signature(s) Signed: 11/17/2020 5:08:22 PM By: [name] Entered By: [name] on 11/17/2020 15:03:19

## 2020-11-17 NOTE — Progress Notes (Signed)
[place], [name] ([id number]) Visit Report for 11/17/2020 HPI Details Patient Name: Date of Service: [name] 11/17/2020 2:45 PM Medical Record Number: [id number] Patient Account Number: [id number] Date of Birth/Sex: Treating RN: [birth year] (35 y.o. 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 He comes in for review of a wound over the plantar fifth metatarsal head extending into the lateral part of the foot. He was followed for this previously by his podiatrist Dr. Cornelius Moras. As the patient tells his story he went to see podiatry first for a swelling he developed on the lateral part of his fifth metatarsal head in May. He states this was "open" by podiatry and the area closed. He was followed up in June and it was again opened callus removed and it closed promptly. There were plans being made for surgery on the fifth metatarsal head in June however his blood sugar was apparently too high for anesthesia. Apparently the area was debrided and opened again in June and it is never closed since. Looking over the records from podiatry I am really not able to follow this. It was clear when he was first seen it was before 5/14 at that point he already had a wound. By 5/17 the ulcer was resolved. I do not see anything about a procedure. On 5/28 noted to have pre-ulcerative moderate keratosis. X-ray noted 1/5 contracted toe and tailor's bunion and metatarsal deformity. On a visit date on 09/28/2018 the dorsal part of the left foot it healed and resolved. There was concern about swelling in his lower extremity he was sent to the ER.. As far as I can tell he was seen in the ER on 7/12 with an ulcer on his left foot. A DVT rule out  of the left leg was negative. I do not think I have complete records from podiatry but I am not able to verify the procedures this patient states he had. He states after the last procedure the wound has never closed although I am not able to follow this in the records I have from podiatry. He has not had a recent x-ray The patient has been using Neosporin on the wound. He is wearing a Darco shoe. He is still very active up on his foot working and exercising. Past medical history; type 2 diabetes ketosis-prone, leg swelling with a negative DVT study in July. Non-smoker ABI in our clinic was 0.85 on the left 10/16; substantial wound on the plantar left fifth met head extending laterally almost to the dorsal fifth MTP. We have been using silver alginate we gave him a Darco forefoot off loader. An x-ray did not show evidence of osteomyelitis did note soft tissue emphysema which I think was due to gas tracking through an open wound. There is no doubt in my mind he requires an MRI 10/23; MRI not booked until 3 November at the earliest this is largely due to his glucose sensor in the right arm. We have been using silver alginate. There has been an improvement 10/29; I am still not exactly sure when his MRI is booked for. He says it is the third but it is the 10th in epic. This definitely needs to be done. He is running a low-grade fever today but no other symptoms. No real improvement  in the 1 02/26/2019 patient presents today for a follow-up visit here in our clinic he is last been seen in the clinic on October 29. Subsequently we were working on getting MRI to evaluate and see what exactly was going on and where we would need to go from the standpoint of whether or not he had osteomyelitis and again what treatments were going be required. Subsequently the patient ended up being admitted to the hospital on 02/07/2019 and was discharged on 02/14/2019. This is a somewhat interesting admission with a discharge  diagnosis of pneumonia due to COVID-19 although he was positive for COVID-19 when tested at the urgent care but negative x2 when he was actually in the hospital. With that being said he did have acute respiratory failure with hypoxia and it was noted he also have a left foot ulceration with osteomyelitis. With that being said he did require oxygen for his pneumonia and I level 4 L. He was placed on antivirals and steroids for the COVID-19. He was also transferred to the [facility] at one point. Nonetheless he did subsequently discharged home and since being home has done much better in that regard. The CT angiogram did not show any pulmonary embolism. With regard to the osteomyelitis the patient was placed on vancomycin and Zosyn while in the hospital but has been changed to Augmentin at discharge. It was also recommended that he follow- up with wound care and podiatry. Podiatry however wanted him to see [name] according to the patient prior to them doing anything further. His hemoglobin A1c was 9.9 as noted in the hospital. Have an MRI of the left foot performed while in the hospital on 02/04/2019. 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 There is an overlying plantar open wound noted an abscess tracking back along the lateral aspect of the fifth metatarsal shaft. There is otherwise diffuse cellulitis and mild fasciitis without findings of polymyositis. The patient did have recently pneumonia secondary to COVID-19 I looked in the chart through epic and it does appear that the patient may need to have an additional x-ray just to ensure everything is cleared and that he has no airspace disease prior to putting him into the [name]. 03/05/2019; patient was readmitted to the clinic last week. He was hospitalized twice for a viral upper respiratory tract infection from 11/1 through 11/4 and then 11/5  through 11/12 ultimately this turned out to be Covid pneumonitis. Although he was discharged on oxygen he is not using it. He says he feels fine. He has no exercise limitation no cough no sputum. His O2 sat in our clinic today was 100% on room air. 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 He received Vanco and Zosyn in the hospital and then was discharged on 2 weeks of Augmentin. I do not see any relevant cultures. He was supposed to follow-up with infectious disease but I do not see that he has an appointment. 12/8; patient saw Dr. Novella Olive of infectious disease last week. He felt that he had had adequate antibiotic therapy. He did not go to follow-up with Dr. Amalia Hailey of podiatry and I have again talked to him about the pros and cons of this. He does not want to consider a ray amputation of this time. He is aware of the risks of  recurrence, migration etc. He started HBO today and tolerated this well. He can complete the Augmentin that I gave him last week. I have looked over the lab work that Dr. Chana Bode ordered his C-reactive protein was 3.3 and his sedimentation rate was 17. The C-reactive protein is never really been measurably that high in this patient 12/15; not much change in the wound today however he has undermining along the lateral part of the foot again more extensively than last week. He has some rims of epithelialization. We have been using silver alginate. He is undergoing hyperbarics but did not dive today 12/18; in for his obligatory first total contact cast change. Unfortunately there was pus coming from the undermining area around his fifth metatarsal head. This was cultured but will preclude reapplication of a cast. He is seen in conjunction with HBO 12/24; patient had staph lugdunensis in the wound in the undermining area laterally last time. We put him on doxycycline  which should have covered this. The wound looks better today. I am going to give him another week of doxycycline before reattempting the total contact cast 12/31; the patient is completing antibiotics. Hemorrhagic debris in the distal part of the wound with some undermining distally. He also had hyper granulation. Extensive debridement with a #5 curette. The infected area that was on the lateral part of the fifth met head is closed over. I do not think he needs any more antibiotics. Patient was seen prior to HBO. Preparations for a total contact cast were made in the cast will be placed post hyperbarics 04/11/19; once again the patient arrives today without complaint. He had been in a cast all week noted that he had heavy drainage this week. This resulted in large raised areas of macerated tissue around the wound 1/14; wound bed looks better slightly smaller. Hydrofera Blue has been changing himself. He had a heavy drainage last week which caused a lot of maceration around the wound so I took him out of a total contact cast he says the drainage is actually better this week He is seen today in conjunction with HBO 1/21; returns to clinic. He was up in [place] for a day or 2 attending a funeral. He comes back in with the wound larger and with a large area of exposed bone. He had osteomyelitis and septic arthritis of the fifth left metatarsal head while he was in hospital. He received IV antibiotics in the hospital for a prolonged period of time then 3 weeks of Augmentin. Subsequently I gave him 2 weeks of doxycycline for more superficial wound infection. When I saw this last week the wound was smaller the surface of the wound looks satisfactory. 1/28; patient missed hyperbarics today. Bone biopsy I did last time showed Enterococcus faecalis and Staphylococcus lugdunensis . He has a wide area of exposed bone. We are going to use silver alginate as of today. I had another ethical discussion with the  patient. 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 In this situation I think the likelihood of healing this is low. Therefore I have recommended a ray amputation and with the patient's agreement I have referred him to Dr. Doran Durand. The other issue is that his compliance with hyperbarics has been minimal because of his work schedule and given his underlying decision I am going to stop this today READMISSION 10/24/2019 MRI 09/29/2019 left foot IMPRESSION: 1. Apparent skin ulceration inferior and lateral to the 5th metatarsal base with underlying heterogeneous T2 signal  and enhancement in the subcutaneous fat. Small peripherally enhancing fluid collections along the plantar and lateral aspects of the 5th metatarsal base suspicious for abscesses. 2. Interval amputation through the mid 5th metatarsal with nonspecific low-level marrow edema and enhancement. Given the proximity to the adjacent soft tissue inflammatory changes, osteomyelitis cannot be excluded. 3. The additional bones appear unremarkable. MRI 09/29/2019 right foot IMPRESSION: 1. Soft tissue ulceration lateral to the 5th MTP joint. There is low-level T2 hyperintensity within the 4th and 5th metatarsal heads and adjacent proximal phalanges without abnormal T1 signal or cortical destruction. These findings are nonspecific and could be seen with early marrow edema, hyperemia or early osteomyelitis. No evidence of septic joint. 2. Mild tenosynovitis and synovial enhancement associated with the extensor digitorum tendons at the level of the midfoot. 3. Diffuse low-level muscular T2 hyperintensity and enhancement, most consistent with diabetic myopathy. LEFT FOOT BONE Methicillin resistant staphylococcus aureus Staphylococcus lugdunensis MIC MIC CIPROFLOXACIN >=8 RESISTANT Resistant <=0.5 SENSI... Sensitive CLINDAMYCIN <=0.25 SENS... Sensitive >=8 RESISTANT Resistant ERYTHROMYCIN >=8 RESISTANT  Resistant >=8 RESISTANT Resistant GENTAMICIN <=0.5 SENSI... Sensitive <=0.5 SENSI... Sensitive Inducible Clindamycin NEGATIVE Sensitive NEGATIVE Sensitive OXACILLIN >=4 RESISTANT Resistant 2 SENSITIVE Sensitive RIFAMPIN <=0.5 SENSI... Sensitive <=0.5 SENSI... Sensitive TETRACYCLINE <=1 SENSITIVE Sensitive <=1 SENSITIVE Sensitive TRIMETH/SULFA <=10 SENSIT Sensitive <=10 SENSIT Sensitive ... Marland Kitchen.. VANCOMYCIN 1 SENSITIVE Sensitive <=0.5 SENSI... Sensitive Right foot bone . Component 3 wk ago Specimen Description BONE Special Requests RIGHT 4 METATARSAL SAMPLE B Gram Stain NO WBC SEEN NO ORGANISMS SEEN Culture RARE METHICILLIN RESISTANT STAPHYLOCOCCUS AUREUS NO ANAEROBES ISOLATED Performed at [facility], [place] [address], [place], [place] [address] Report Status 10/08/2019 FINAL Organism ID, Bacteria METHICILLIN RESISTANT STAPHYLOCOCCUS AUREUS Resulting Agency [facility] Susceptibility Methicillin resistant staphylococcus aureus MIC CIPROFLOXACIN >=8 RESISTANT Resistant CLINDAMYCIN <=0.25 SENS... Sensitive ERYTHROMYCIN >=8 RESISTANT Resistant GENTAMICIN <=0.5 SENSI... Sensitive Inducible Clindamycin NEGATIVE Sensitive OXACILLIN >=4 RESISTANT Resistant RIFAMPIN <=0.5 SENSI... Sensitive TETRACYCLINE <=1 SENSITIVE Sensitive TRIMETH/SULFA <=10 SENSIT Sensitive ... VANCOMYCIN 1 SENSITIVE Sensitive This is a patient we had in clinic earlier this year with a wound over his left fifth metatarsal head. 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 Doran Durand on 05/16/2019. He seems to have developed problems with wounds on his bilateral feet in June 2021 although he may have had problems earlier than this. He was  in an urgent care with a right foot ulcer on 09/26/2019 and given a course of doxycycline. This was apparently after having trouble getting into see orthopedics. He was seen by podiatry on 09/28/2019 noted to have bilateral lower extremity ulcers including the left lateral fifth metatarsal base and the right subfifth met head. It was noted that had purulent drainage at that time. He required hospitalization from 6/20 through 7/2. This was because of worsening right foot wounds. He underwent bilateral operative incision and drainage and bone biopsies bilaterally. Culture results are listed above. He has been referred back to clinic by Dr. Jacqualyn Posey of podiatry. He is also followed by Dr. Megan Salon who saw him yesterday. He was discharged from hospital on Zyvox Flagyl and Levaquin and yesterday changed to doxycycline Flagyl and Levaquin. His inflammatory markers on 6/26 showed a sedimentation rate of 129 and a C-reactive protein of 5. This is  improved to 14 and 1.3 respectively. This would indicate improvement. ABIs in our clinic today were 1.23 on the right and 1.20 on the left 11/01/2019 on evaluation today patient appears to be doing fairly well in regard to the wounds on his feet at this point. Fortunately there is no signs of active infection at this time. No fevers, chills, nausea, vomiting, or diarrhea. He currently is seeing infectious disease and still under their care at this point. Subsequently he also has both wounds which she has not been using collagen on as he did not receive that in his packaging he did not call us and let us know that. Apparently that just was missed on the order. Nonetheless we will get that straightened out today. 8/9-Patient returns for bilateral foot wounds, using Prisma with hydrogel moistened dressings, and the wounds appear stable. Patient using surgical shoes, avoiding much pressure or weightbearing as much as possible 8/16; patient has bilateral foot wounds. 1 on the  right lateral foot proximally the other is on the left mid lateral foot. Both required debridement of callus and thick skin around the wounds. We have been using silver collagen 8/27; patient has bilateral lateral foot wounds. The area on the left substantially surrounded by callus and dry skin. This was removed from the wound edge. The underlying wound is small. The area on the right measured somewhat smaller today. 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 Unfortunately I did not update his antibiotics during today's visit. 9/10 I reviewed Dr. Hale Bogus last notes he felt he had completed antibiotics his inflammatory markers were reasonably well controlled. He has a small wound on the lateral left foot and a tiny area on the right which is just above closed. He is using Hydrofera Blue with border foam he has bilateral surgical shoes 9/24; 2 week f/u. doing well. right foot is closed. left foot still undermined. 10/14; right foot remains closed at the fifth met head. The area over the base of the left fifth metatarsal has a small open area but considerable undermining towards the plantar foot. Thick callus skin around this suggests an adequate pressure relief. We have talked about this. He says he is going to go back into his cam boot. I suggested a total contact cast he did not seem enamored with this suggestion 10/26; left foot base of the fifth metatarsal. Same condition as last time. He has skin over the area with an open wound however the skin is not adherent. He went to see Dr. Earleen Newport who did an x-ray and culture of his foot I have not reviewed the x-ray but the patient was not told anything. He is on doxycycline 11/11; since the patient was last here he was in the emergency room on 10/30 he was concerned about swelling in the left foot. They did not do any cultures or x-rays. They changed his antibiotics to cephalexin.  Previous culture showed group B strep. The cephalexin is appropriate as doxycycline has less than predictable coverage. Arrives in clinic today with swelling over this area under the wound. He also has a new wound on the right fifth metatarsal head 11/18; the patient has a difficult wound on the lateral aspect of the left fifth metatarsal head. The wound was almost ballotable last week I opened it slightly expecting to see purulence however there was just bleeding. I cultured this this was negative. X-ray unchanged. We are trying to get an MRI  but I am not sure were going to be able to get this through his insurance. He also has an area on the right lateral fifth metatarsal head this looks healthier 12/3; the patient finally got our MRI. Surprisingly this did not show osteomyelitis. I did show the soft tissue ulceration at the lateral plantar aspect of the fifth metatarsal base with a tiny residual 6 mm abscess overlying the superficial fascia I have tried to culture this area I have not been able to get this to grow anything. Nevertheless the protruding tissue looks aggravated. I suspect we should try to treat the underlying "abscess with broad-spectrum antibiotics. I am going to start him on Levaquin and Flagyl. He has much less edema in his legs and I am going to continue to wrap his legs and see him weekly 12/10. I started Levaquin and Flagyl on him last week. He just picked up the Flagyl apparently there was some delay. The worry is the wound on the left fifth metatarsal base which is substantial and worsening. His foot looks like he inverts at the ankle making this a weightbearing surface. Certainly no improvement in fact I think the measurements of this are somewhat worse. We have been using 12/17; he apparently just got the Levaquin yesterday this is 2 weeks after the fact. He has completed the Flagyl. The area over the left fifth metatarsal base still has protruding granulation tissue although  it does not look quite as bad as it did some weeks ago. He has severe bilateral lymphedema although we have not been treating him for wounds on his legs this is definitely going to require compression. There was so much edema in the left I did not wish to put him in a total contact cast today. I am going to increase his compression from 3-4 layer. The area on the right lateral fifth met head actually look quite good and superficial. 12/23; patient arrived with callus on the right fifth met head and the substantial hyper granulated callused wound on the base of his fifth metatarsal. He says he is completing his Levaquin in 2 days but I do not think that adds up with what I gave him but I will have to double check this. We are using Hydrofera Blue on both areas. My plan is to put the left leg in a cast the week after New Year's 04/06/2020; patient's wounds about the same. Right lateral fifth metatarsal head and left lateral foot over the base of the fifth metatarsal. There is undermining on the left lateral foot which I removed before application of total contact cast continuing with Hydrofera Blue new. Patient tells me he was seen by endocrinology today lab work was done [Dr. Kerr]. Also wondering whether he was referred to cardiology. I went over some lab work from previously does not have chronic renal failure certainly not nephrotic range proteinuria he does have very poorly controlled diabetes but this is not his most updated lab work. Hemoglobin A1c has been over 11 1/10; the patient had a considerable amount of leakage towards mid part of his left foot with macerated skin however the wound surface looks better the area on the right lateral fifth met head is better as well. I am going to change the dressing on the left foot under the total contact cast to silver alginate, continue with Hydrofera Blue on the right. 1/20; patient was in the total contact cast for 10 days. Considerable amount of  drainage although the skin around  the wound does not look too bad on the left foot. The area on the right fifth metatarsal head is closed. Our nursing staff reports large amount of drainage out of the left lateral foot wound 1/25; continues with copious amounts of drainage described by our intake staff. PCR culture I did last week showed E. coli and Enterococcus faecalis and low quantities. Multiple resistance genes documented including extended spectrum beta lactamase, MRSA, MRSE, quinolone, tetracycline. The wound is not quite as good this week as it was 5 days ago but about the same size 2/3; continues with copious amounts of malodorous drainage per our intake nurse. The PCR culture I did 2 weeks ago showed E. coli and low quantities of Enterococcus. There were multiple resistance genes detected. I put Neosporin on him last week although this does not seem to have helped. The wound is slightly deeper today. Offloading continues to be an issue here although with the amount of drainage she has a total contact cast is just not going to work 2/10; moderate amount of drainage. Patient reports he cannot get his stocking on over the dressing. I told him we have to do that the nurse gave him suggestions on how to make this work. The wound is on the bottom and lateral part of his left foot. Is cultured predominantly grew low amounts of Enterococcus, E. coli and anaerobes. There were multiple resistance genes detected including extended spectrum beta lactamase, quinolone, tetracycline. I could not think of an easy oral combination to address this so for now I am going to do topical antibiotics provided by [facility] I think the main agents here are vancomycin and an aminoglycoside. We have to be able to give him access to the wounds to get the topical antibiotic on 2/17; moderate amount of drainage this is unchanged. He has his Keystone topical antibiotic against the deep tissue culture organisms. He has been  using this and changing the dressing daily. Silver alginate on the wound surface. 2/24; using Keystone antibiotic with silver alginate on the top. He had too much drainage for a total contact cast at one point although I think that is improving and I think in the next week or 2 it might be possible to replace a total contact cast I did not do this today. In general the wound surface looks healthy however he continues to have thick rims of skin and subcutaneous tissue around the wide area of the circumference which I debrided 06/04/2020 upon evaluation today patient appears to be doing well in regard to his wound. I do feel like he is showing signs of improvement. There is little bit of callus and dead tissue around the edges of the wound as well as what appears to be a little bit of a sinus tract that is off to the side laterally I would perform debridement to clear that away today. 3/17; left lateral foot. The wound looks about the same as I remember. Not much depth surface looks healthy. No evidence of infection 3/25; left lateral foot. Wound surface looks about the same. Separating epithelium from the circumference. There really is no evidence of infection here however not making progress by my view 3/29; left lateral foot. Surface of the wound again looks reasonably healthy still thick skin and subcutaneous tissue around the wound margins. There is no evidence of infection. One of the concerns being brought up by the nurses has again the amount of drainage vis--vis continued use of a total contact cast 4/5; left  lateral foot at roughly the base of the fifth metatarsal. Nice healthy looking granulated tissue with rims of epithelialization. The overall wound measurements are not any better but the tissue looks healthy. The only concern is the amount of drainage although he has no surrounding maceration with what we have been doing recently to absorb fluid and protect his skin. He also has lymphedema.  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 He states that the kerlix/coban under the TCC rolled down and could not get it back up. He has been using an offloading scooter and has somehow hurt his right foot using this device. This happened last week. He states that the side of his right foot developed a blister and opened. The top of his foot also has a few small open wounds he thinks is due to his socks rubbing in his shoes. He has not been using any dressings to the wound. He denies purulent drainage, fever/chills or erythema to the wounds. 4/22; patient presents for 1 week follow-up. He developed new wounds to the right foot that were evaluated at last clinic visit. He continues to have a total contact cast to the left leg and he reports no issues. He has been using silver collagen to the right foot wounds with no issues. He denies purulent drainage, fever/chills or erythema to the right foot wounds. He has no complaints today 4/25; patient presents for 1 week follow-up. He has a total contact cast of the left leg and reports no issues. He has been using silver alginate to the right foot wound. He denies purulent drainage, fever/chills or erythema to the right foot wounds. 5/2 patient presents for 1 week follow-up. T contact cast on the left. The wound which is on the base of the plantar foot at the base of the fifth metatarsal otal actually looks quite good and dimensions continue to gradually contract. HOWEVER the area on the right lateral fifth metatarsal head is much larger than what I remember from 2 weeks ago. Once more is he has significant levels of hypergranulation. Noteworthy that he had this same hyper granulated response on his wound on the  left foot at one point in time. So much so that he I thought there was an underlying fluid collection. Based on this I think this just needs debridement. 5/9; the wound on the left actually continues to be gradually smaller with a healthy surface. Slight amount of drainage and maceration of the skin around but not too bad. However he has a large wound over the right fifth metatarsal head very much in the same configuration as his left foot wound was initially. I used silver nitrate to address the hyper granulated tissue no mechanical debridement 5/16; area on the left foot did not look as healthy this week deeper thick surrounding macerated skin and subcutaneous tissue. The area on the right foot fifth met head was about the same The area on the right ankle that we identified last week is completely broken down into an open wound presumably a stocking rubbing issue 5/23; patient has been using a total contact cast to the left side. He has been using silver alginate underneath. He has also been using silver alginate to the right foot  wounds. He has no complaints today. He denies any signs of infection. 5/31; the left-sided wound looks some better measure smaller surface granulation looks better. We have been using silver alginate under the total contact cast The large area on his right fifth met head and right dorsal foot look about the same still using silver alginate 6/6; neither side is good as I was hoping although the surface area dimensions are better. A lot of maceration on his left and right foot around the wound edge. Area on the dorsal right foot looks better. He says he was traveling. I am not sure what does the amount of maceration around the plantar wounds may be drainage issues 6/13; in general the wound surfaces look quite good on both sides. Macerated skin and raised edges around the wound required debridement although in general especially on the left the surface area seems  improved. The area on the right dorsal ankle is about the same I thought this would not be such a problem to close 6/20; not much change in either wound although the one on the right looks a little better. Both wounds have thick macerated edges to the skin requiring debridements. We have been using silver alginate. The area on the dorsal right ankle is still open I thought this would be closed. 6/28; patient comes in today with a marked deterioration in the right foot wound fifth met head. Wide area of exposed bone this is a drastic change from last time. The area on the left there we have been casting is stagnant. We have been using silver alginate in both wound areas. 7/5; bone culture I did for PCR last time was positive for Pseudomonas, group B strep, Enterococcus and Staph aureus. There was no suggestion of methicillin resistance or ampicillin resistant genes. This was resistant to tetracycline however He comes into the clinic today with the area over his right plantar fifth metatarsal head which had been doing so well 2 weeks ago completely necrotic feeling bone. I do not know that this is going to be salvageable. The left foot wound is certainly no smaller but it has a better surface and is superficial. 7/8; patient called in this morning to say that his total contact cast was rubbing against his foot. He states he is doing fine overall. He denies signs of infection. 7/12; continued deterioration in the wound over the right fifth metatarsal head crumbling bone. This is not going to be salvageable. The patient agrees and wants to be referred to Dr. Doran Durand which we will attempt to arrange as soon as possible. I am going to continue him on antibiotics as long as that takes so I will renew those today. The area on the left foot which is the base of the fifth metatarsal continues to look somewhat better. Healthy looking tissue no depth no debridement is necessary here. 7/20; the patient was kindly  seen by Dr. Doran Durand of orthopedics on 10/19/2020. 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 Towards this end we have taken him out of the total contact cast on the left we will put him in a wrap with Hydrofera Blue. As I understand things surgery is planned for 7/21 7/27; patient had his surgery last Thursday. 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 Apparently everything went well we did not still disturb that today The area on the left foot actually  looks quite good. He has been much less mobile which probably explains this he did not seem to do well in the total contact cast secondary to drainage and maceration I think. 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 Fortunately there is no evidence of active infection at this time. No fevers, chills, nausea, vomiting, or diarrhea. Overall I think that he is actually doing extremely well. Nonetheless I do believe that he is staying off of this more following the surgery in his right foot that is the reason the left is doing so great. 8/16; left plantar foot wound. This looks smaller than the last time I saw this he is using Hydrofera Blue. The surgical wound on the right foot is being followed by Dr. Doran Durand we did not look at this today. He has surgical shoes on both feet Electronic Signature(s) Signed: 11/17/2020 4:53:39 PM By: [name] MD Entered By: [name] on 11/17/2020 16:31:28 -------------------------------------------------------------------------------- Physical Exam Details Patient Name: Date of Service: [name], [name] 11/17/2020 2:45 PM Medical Record Number: [id number] Patient Account Number: [id number] Date of Birth/Sex: Treating RN: January 30, 1986 (35 y.o. [name] Primary Care Provider: [name] Other  Clinician: Referring Provider: Treating Provider/Extender: [name] in Treatment: 71 Constitutional Patient is hypertensive.. Pulse regular and within target range for patient.Marland Kitchen Respirations regular, non-labored and within target range.. Temperature is normal and within the target range for the patient.Marland Kitchen Appears in no distress. Cardiovascular Pedal pulses are palpable. Integumentary (Hair, Skin) No erythema around the wound. Notes Wound exam; left lateral plantar foot. The wound still has some depth but generally looks healthy. There is no evidence of surrounding infection. Electronic Signature(s) Signed: 11/17/2020 4:53:39 PM By: [name] MD Entered By: [name] on 11/17/2020 16:32:43 -------------------------------------------------------------------------------- Physician Orders Details Patient Name: Date of Service: [name], [name] 11/17/2020 2:45 PM Medical Record Number: [id number] Patient Account Number: [id number] Date of Birth/Sex: Treating RN: 1985-07-04 (35 y.o. [name] Primary Care Provider: [name] Other Clinician: Referring Provider: Treating Provider/Extender: [name] in Treatment: 62 Verbal / Phone Orders: No Diagnosis Coding ICD-10 Coding Code Description E11.621 Type 2 diabetes mellitus with foot ulcer L97.528 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ity L97.518 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ity Follow-up Appointments ppointment in 1 week. - with Dr. Dellia Nims Return A Edema Control - Lymphedema / SCD / Other Bilateral Lower Extremities Elevate legs to the level of the heart or above for 30 minutes daily and/or when sitting, a frequency of: - throughout the day Avoid standing for long periods of time. Exercise regularly Moisturize legs daily. - right leg every night before  bed. Compression stocking or Garment 20-30 mm/Hg pressure to: - Apply to right leg in the morning and remove at night. Off-Loading Open toe surgical shoe to: - bilateral feet Wound Treatment Wound #3 - Foot Wound Laterality: Left, Lateral Cleanser: Soap and Water Every Other Day/30 Days Discharge Instructions: May shower and wash wound with dial antibacterial soap and water prior to dressing change. Peri-Wound Care: Zinc Oxide Ointment 30g tube Every Other Day/30 Days Discharge Instructions: Apply Zinc Oxide to periwound with each dressing change as needed. Prim Dressing: Hydrofera Blue Classic Foam, 2x2 in (Generic) Every Other Day/30 Days ary Discharge Instructions: Moisten with saline  prior to applying to wound bed Secondary Dressing: Woven Gauze Sponge, Non-Sterile 4x4 in (Generic) Every Other Day/30 Days Discharge Instructions: Apply over primary dressing as directed. Secondary Dressing: ABD Pad, 8x10 (Generic) Every Other Day/30 Days Discharge Instructions: T down before applying kerlix. Apply over primary dressing as directed. ape Secured With: Coban Self-Adherent Wrap 4x5 (in/yd) Every Other Day/30 Days Discharge Instructions: Secure with Coban as directed. Secured With: [employer], 4.5x3.1 (in/yd) Every Other Day/30 Days Discharge Instructions: Secure with Kerlix as directed. Electronic Signature(s) Signed: 11/17/2020 4:53:39 PM By: [name] MD Signed: 11/17/2020 5:08:22 PM By: [name] Entered By: [name] on 11/17/2020 15:57:47 -------------------------------------------------------------------------------- Problem List Details Patient Name: Date of Service: [name], [name] 11/17/2020 2:45 PM Medical Record Number: [id number] Patient Account Number: [id number] Date of Birth/Sex: Treating RN: Apr 29, 1985 (35 y.o. [name] Primary Care Provider: [name] Other Clinician: Referring Provider: Treating Provider/Extender: [name] in Treatment: 55 Active Problems ICD-10 Encounter Code Description Active Date MDM Diagnosis E11.621 Type 2 diabetes mellitus with foot ulcer 10/24/2019 No Yes L97.528 Non-pressure chronic ulcer of other part of left foot with other specified 10/24/2019 No Yes severity L97.518 Non-pressure chronic ulcer of other part of right foot with other specified 07/14/2020 No Yes severity Inactive Problems ICD-10 Code Description Active Date Inactive Date L97.518 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ity 10/24/2019 10/24/2019 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, right ankle and foot 10/24/2019 10/24/2019 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 08/10/2020 08/10/2020 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 He comes in for review of a wound over the plantar fifth metatarsal head extending into the lateral part of the foot. He was followed for this previously by his  podiatrist Dr. Cornelius Moras. As the patient tells his story he went to see podiatry first for a swelling he developed on the lateral part of his fifth metatarsal head in May. He states this was "open" by podiatry and the area closed. He was followed up in June and it was again opened callus removed and it closed promptly. There were plans being made for surgery on the fifth metatarsal head in June however his blood sugar was apparently too high for anesthesia. Apparently the area was debrided and opened again in June and it is never closed since. Looking over the records from podiatry I am really not able to follow this. It was clear when he was first seen it was before 5/14 at that point he already had a wound. By 5/17 the ulcer was resolved. I do not see anything about a procedure. On 5/28 noted to have pre-ulcerative moderate keratosis. X-ray noted 1/5 contracted toe and tailor's bunion and metatarsal deformity. On a visit date on 09/28/2018 the dorsal part of  the left foot it healed and resolved. There was concern about swelling in his lower extremity he was sent to the ER.. As far as I can tell he was seen in the ER on 7/12 with an ulcer on his left foot. A DVT rule out of the left leg was negative. I do not think I have complete records from podiatry but I am not able to verify the procedures this patient states he had. He states after the last procedure the wound has never closed although I am not able to follow this in the records I have from podiatry. He has not had a recent x-ray The patient has been using Neosporin on the wound. He is wearing a Darco shoe. He is still very active up on his foot working and exercising. Past medical history; type 2 diabetes ketosis-prone, leg swelling with a negative DVT study in July. Non-smoker ABI in our clinic was 0.85 on the left 10/16; substantial wound on the plantar left fifth met head extending laterally almost to the dorsal fifth MTP. We have been using  silver alginate we gave him a Darco forefoot off loader. An x-ray did not show evidence of osteomyelitis did note soft tissue emphysema which I think was due to gas tracking through an open wound. There is no doubt in my mind he requires an MRI 10/23; MRI not booked until 3 November at the earliest this is largely due to his glucose sensor in the right arm. We have been using silver alginate. There has been an improvement 10/29; I am still not exactly sure when his MRI is booked for. He says it is the third but it is the 10th in epic. This definitely needs to be done. He is running a low-grade fever today but no other symptoms. No real improvement in the 1 02/26/2019 patient presents today for a follow-up visit here in our clinic he is last been seen in the clinic on October 29. Subsequently we were working on getting MRI to evaluate and see what exactly was going on and where we would need to go from the standpoint of whether or not he had osteomyelitis and again what treatments were going be required. Subsequently the patient ended up being admitted to the hospital on 02/07/2019 and was discharged on 02/14/2019. This is a somewhat interesting admission with a discharge diagnosis of pneumonia due to COVID-19 although he was positive for COVID-19 when tested at the urgent care but negative x2 when he was actually in the hospital. With that being said he did have acute respiratory failure with hypoxia and it was noted he also have a left foot ulceration with osteomyelitis. With that being said he did require oxygen for his pneumonia and I level 4 L. He was placed on antivirals and steroids for the COVID-19. He was also transferred to the [facility] at one point. Nonetheless he did subsequently discharged home and since being home has done much better in that regard. The CT angiogram did not show any pulmonary embolism. With regard to the osteomyelitis the patient was placed on vancomycin and  Zosyn while in the hospital but has been changed to Augmentin at discharge. It was also recommended that he follow- up with wound care and podiatry. Podiatry however wanted him to see [name] according to the patient prior to them doing anything further. His hemoglobin A1c was 9.9 as noted in the hospital. Have an MRI of the left foot performed while in the hospital on  02/04/2019. 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 There is an overlying plantar open wound noted an abscess tracking back along the lateral aspect of the fifth metatarsal shaft. There is otherwise diffuse cellulitis and mild fasciitis without findings of polymyositis. The patient did have recently pneumonia secondary to COVID-19 I looked in the chart through epic and it does appear that the patient may need to have an additional x-ray just to ensure everything is cleared and that he has no airspace disease prior to putting him into the [name]. 03/05/2019; patient was readmitted to the clinic last week. He was hospitalized twice for a viral upper respiratory tract infection from 11/1 through 11/4 and then 11/5 through 11/12 ultimately this turned out to be Covid pneumonitis. Although he was discharged on oxygen he is not using it. He says he feels fine. He has no exercise limitation no cough no sputum. His O2 sat in our clinic today was 100% on room air. 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 He received Vanco and Zosyn in the hospital and then was discharged on 2 weeks of Augmentin. I do not see any relevant cultures. He was supposed to follow-up with infectious disease but I do not see that he has an appointment. 12/8; patient saw Dr. Novella Olive of infectious disease last week. He felt that he had had adequate antibiotic therapy.  He did not go to follow-up with Dr. Amalia Hailey of podiatry and I have again talked to him about the pros and cons of this. He does not want to consider a ray amputation of this time. He is aware of the risks of recurrence, migration etc. He started HBO today and tolerated this well. He can complete the Augmentin that I gave him last week. I have looked over the lab work that Dr. Chana Bode ordered his C-reactive protein was 3.3 and his sedimentation rate was 17. The C-reactive protein is never really been measurably that high in this patient 12/15; not much change in the wound today however he has undermining along the lateral part of the foot again more extensively than last week. He has some rims of epithelialization. We have been using silver alginate. He is undergoing hyperbarics but did not dive today 12/18; in for his obligatory first total contact cast change. Unfortunately there was pus coming from the undermining area around his fifth metatarsal head. This was cultured but will preclude reapplication of a cast. He is seen in conjunction with HBO 12/24; patient had staph lugdunensis in the wound in the undermining area laterally last time. We put him on doxycycline which should have covered this. The wound looks better today. I am going to give him another week of doxycycline before reattempting the total contact cast 12/31; the patient is completing antibiotics. Hemorrhagic debris in the distal part of the wound with some undermining distally. He also had hyper granulation. Extensive debridement with a #5 curette. The infected area that was on the lateral part of the fifth met head is closed over. I do not think he needs any more antibiotics. Patient was seen prior to HBO. Preparations for a total contact cast were made in the cast will be placed post hyperbarics 04/11/19; once again the patient arrives today without complaint. He had been in a cast all week noted that he had  heavy drainage this week.  This resulted in large raised areas of macerated tissue around the wound 1/14; wound bed looks better slightly smaller. Hydrofera Blue has been changing himself. He had a heavy drainage last week which caused a lot of maceration around the wound so I took him out of a total contact cast he says the drainage is actually better this week He is seen today in conjunction with HBO 1/21; returns to clinic. He was up in [place] for a day or 2 attending a funeral. He comes back in with the wound larger and with a large area of exposed bone. He had osteomyelitis and septic arthritis of the fifth left metatarsal head while he was in hospital. He received IV antibiotics in the hospital for a prolonged period of time then 3 weeks of Augmentin. Subsequently I gave him 2 weeks of doxycycline for more superficial wound infection. When I saw this last week the wound was smaller the surface of the wound looks satisfactory. 1/28; patient missed hyperbarics today. Bone biopsy I did last time showed Enterococcus faecalis and Staphylococcus lugdunensis . He has a wide area of exposed bone. We are going to use silver alginate as of today. I had another ethical discussion with the patient. 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 In this situation I think the likelihood of healing this is low. Therefore I have recommended a ray amputation and with the patient's agreement I have referred him to Dr. Doran Durand. The other issue is that his compliance with hyperbarics has been minimal because of his work schedule and given his underlying decision I am going to stop this today READMISSION 10/24/2019 MRI 09/29/2019 left foot IMPRESSION: 1. Apparent skin ulceration inferior and lateral to the 5th metatarsal base with underlying heterogeneous T2 signal and enhancement in the subcutaneous fat. Small peripherally enhancing fluid collections along the plantar and lateral aspects of the  5th metatarsal base suspicious for abscesses. 2. Interval amputation through the mid 5th metatarsal with nonspecific low-level marrow edema and enhancement. Given the proximity to the adjacent soft tissue inflammatory changes, osteomyelitis cannot be excluded. 3. The additional bones appear unremarkable. MRI 09/29/2019 right foot IMPRESSION: 1. Soft tissue ulceration lateral to the 5th MTP joint. There is low-level T2 hyperintensity within the 4th and 5th metatarsal heads and adjacent proximal phalanges without abnormal T1 signal or cortical destruction. These findings are nonspecific and could be seen with early marrow edema, hyperemia or early osteomyelitis. No evidence of septic joint. 2. Mild tenosynovitis and synovial enhancement associated with the extensor digitorum tendons at the level of the midfoot. 3. Diffuse low-level muscular T2 hyperintensity and enhancement, most consistent with diabetic myopathy. LEFT FOOT BONE Methicillin resistant staphylococcus aureus Staphylococcus lugdunensis MIC MIC CIPROFLOXACIN >=8 RESISTANT Resistant <=0.5 SENSI... Sensitive CLINDAMYCIN <=0.25 SENS... Sensitive >=8 RESISTANT Resistant ERYTHROMYCIN >=8 RESISTANT Resistant >=8 RESISTANT Resistant GENTAMICIN <=0.5 SENSI... Sensitive <=0.5 SENSI... Sensitive Inducible Clindamycin NEGATIVE Sensitive NEGATIVE Sensitive OXACILLIN >=4 RESISTANT Resistant 2 SENSITIVE Sensitive RIFAMPIN <=0.5 SENSI... Sensitive <=0.5 SENSI... Sensitive TETRACYCLINE <=1 SENSITIVE Sensitive <=1 SENSITIVE Sensitive TRIMETH/SULFA <=10 SENSIT Sensitive <=10 SENSIT Sensitive ... Marland Kitchen.. VANCOMYCIN 1 SENSITIVE Sensitive <=0.5 SENSI... Sensitive Right foot bone . Component 3 wk ago Specimen Description BONE Special Requests RIGHT 4 METATARSAL SAMPLE B Gram Stain NO WBC SEEN NO ORGANISMS SEEN Culture RARE METHICILLIN RESISTANT STAPHYLOCOCCUS AUREUS NO ANAEROBES ISOLATED Performed at [facility], [place] [address]  [place], [place] [address] Report Status 10/08/2019 FINAL Organism ID, Bacteria METHICILLIN  RESISTANT STAPHYLOCOCCUS AUREUS Resulting Agency [facility] Susceptibility Methicillin resistant staphylococcus aureus MIC CIPROFLOXACIN >=8 RESISTANT Resistant CLINDAMYCIN <=0.25 SENS... Sensitive ERYTHROMYCIN >=8 RESISTANT Resistant GENTAMICIN <=0.5 SENSI... Sensitive Inducible Clindamycin NEGATIVE Sensitive OXACILLIN >=4 RESISTANT Resistant RIFAMPIN <=0.5 SENSI... Sensitive TETRACYCLINE <=1 SENSITIVE Sensitive TRIMETH/SULFA <=10 SENSIT Sensitive ... VANCOMYCIN 1 SENSITIVE Sensitive This is a patient we had in clinic earlier this year with a wound over his left fifth metatarsal head. 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 Doran Durand on 05/16/2019. He seems to have developed problems with wounds on his bilateral feet in June 2021 although he may have had problems earlier than this. He was in an urgent care with a right foot ulcer on 09/26/2019 and given a course of doxycycline. This was apparently after having trouble getting into see orthopedics. He was seen by podiatry on 09/28/2019 noted to have bilateral lower extremity ulcers including the left lateral fifth metatarsal base and the right subfifth met head. It was noted that had purulent drainage at that time. He required hospitalization from 6/20 through 7/2. This was because of worsening right foot wounds. He underwent bilateral operative incision and drainage and bone biopsies bilaterally. Culture results are listed above. He has been referred back to clinic by Dr. Jacqualyn Posey of podiatry. He is also followed by Dr. Megan Salon who saw him yesterday. He was discharged from  hospital on Zyvox Flagyl and Levaquin and yesterday changed to doxycycline Flagyl and Levaquin. His inflammatory markers on 6/26 showed a sedimentation rate of 129 and a C-reactive protein of 5. This is improved to 14 and 1.3 respectively. This would indicate improvement. ABIs in our clinic today were 1.23 on the right and 1.20 on the left 11/01/2019 on evaluation today patient appears to be doing fairly well in regard to the wounds on his feet at this point. Fortunately there is no signs of active infection at this time. No fevers, chills, nausea, vomiting, or diarrhea. He currently is seeing infectious disease and still under their care at this point. Subsequently he also has both wounds which she has not been using collagen on as he did not receive that in his packaging he did not call us and let us know that. Apparently that just was missed on the order. Nonetheless we will get that straightened out today. 8/9-Patient returns for bilateral foot wounds, using Prisma with hydrogel moistened dressings, and the wounds appear stable. Patient using surgical shoes, avoiding much pressure or weightbearing as much as possible 8/16; patient has bilateral foot wounds. 1 on the right lateral foot proximally the other is on the left mid lateral foot. Both required debridement of callus and thick skin around the wounds. We have been using silver collagen 8/27; patient has bilateral lateral foot wounds. The area on the left substantially surrounded by callus and dry skin. This was removed from the wound edge. The underlying wound is small. The area on the right measured somewhat smaller today. 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 Unfortunately I did not update his antibiotics during today's visit. 9/10 I reviewed Dr. Hale Bogus last notes he felt he had completed antibiotics his inflammatory markers were reasonably well controlled. He has a  small  wound on the lateral left foot and a tiny area on the right which is just above closed. He is using Hydrofera Blue with border foam he has bilateral surgical shoes 9/24; 2 week f/u. doing well. right foot is closed. left foot still undermined. 10/14; right foot remains closed at the fifth met head. The area over the base of the left fifth metatarsal has a small open area but considerable undermining towards the plantar foot. Thick callus skin around this suggests an adequate pressure relief. We have talked about this. He says he is going to go back into his cam boot. I suggested a total contact cast he did not seem enamored with this suggestion 10/26; left foot base of the fifth metatarsal. Same condition as last time. He has skin over the area with an open wound however the skin is not adherent. He went to see Dr. Earleen Newport who did an x-ray and culture of his foot I have not reviewed the x-ray but the patient was not told anything. He is on doxycycline 11/11; since the patient was last here he was in the emergency room on 10/30 he was concerned about swelling in the left foot. They did not do any cultures or x-rays. They changed his antibiotics to cephalexin. Previous culture showed group B strep. The cephalexin is appropriate as doxycycline has less than predictable coverage. Arrives in clinic today with swelling over this area under the wound. He also has a new wound on the right fifth metatarsal head 11/18; the patient has a difficult wound on the lateral aspect of the left fifth metatarsal head. The wound was almost ballotable last week I opened it slightly expecting to see purulence however there was just bleeding. I cultured this this was negative. X-ray unchanged. We are trying to get an MRI but I am not sure were going to be able to get this through his insurance. He also has an area on the right lateral fifth metatarsal head this looks healthier 12/3; the patient finally got our MRI.  Surprisingly this did not show osteomyelitis. I did show the soft tissue ulceration at the lateral plantar aspect of the fifth metatarsal base with a tiny residual 6 mm abscess overlying the superficial fascia I have tried to culture this area I have not been able to get this to grow anything. Nevertheless the protruding tissue looks aggravated. I suspect we should try to treat the underlying "abscess with broad-spectrum antibiotics. I am going to start him on Levaquin and Flagyl. He has much less edema in his legs and I am going to continue to wrap his legs and see him weekly 12/10. I started Levaquin and Flagyl on him last week. He just picked up the Flagyl apparently there was some delay. The worry is the wound on the left fifth metatarsal base which is substantial and worsening. His foot looks like he inverts at the ankle making this a weightbearing surface. Certainly no improvement in fact I think the measurements of this are somewhat worse. We have been using 12/17; he apparently just got the Levaquin yesterday this is 2 weeks after the fact. He has completed the Flagyl. The area over the left fifth metatarsal base still has protruding granulation tissue although it does not look quite as bad as it did some weeks ago. He has severe bilateral lymphedema although we have not been treating him for wounds on his legs this is definitely going to require compression. There was so much edema in the left  I did not wish to put him in a total contact cast today. I am going to increase his compression from 3-4 layer. The area on the right lateral fifth met head actually look quite good and superficial. 12/23; patient arrived with callus on the right fifth met head and the substantial hyper granulated callused wound on the base of his fifth metatarsal. He says he is completing his Levaquin in 2 days but I do not think that adds up with what I gave him but I will have to double check this. We are using  Hydrofera Blue on both areas. My plan is to put the left leg in a cast the week after New Year's 04/06/2020; patient's wounds about the same. Right lateral fifth metatarsal head and left lateral foot over the base of the fifth metatarsal. There is undermining on the left lateral foot which I removed before application of total contact cast continuing with Hydrofera Blue new. Patient tells me he was seen by endocrinology today lab work was done [Dr. Kerr]. Also wondering whether he was referred to cardiology. I went over some lab work from previously does not have chronic renal failure certainly not nephrotic range proteinuria he does have very poorly controlled diabetes but this is not his most updated lab work. Hemoglobin A1c has been over 11 1/10; the patient had a considerable amount of leakage towards mid part of his left foot with macerated skin however the wound surface looks better the area on the right lateral fifth met head is better as well. I am going to change the dressing on the left foot under the total contact cast to silver alginate, continue with Hydrofera Blue on the right. 1/20; patient was in the total contact cast for 10 days. Considerable amount of drainage although the skin around the wound does not look too bad on the left foot. The area on the right fifth metatarsal head is closed. Our nursing staff reports large amount of drainage out of the left lateral foot wound 1/25; continues with copious amounts of drainage described by our intake staff. PCR culture I did last week showed E. coli and Enterococcus faecalis and low quantities. Multiple resistance genes documented including extended spectrum beta lactamase, MRSA, MRSE, quinolone, tetracycline. The wound is not quite as good this week as it was 5 days ago but about the same size 2/3; continues with copious amounts of malodorous drainage per our intake nurse. The PCR culture I did 2 weeks ago showed E. coli and low quantities  of Enterococcus. There were multiple resistance genes detected. I put Neosporin on him last week although this does not seem to have helped. The wound is slightly deeper today. Offloading continues to be an issue here although with the amount of drainage she has a total contact cast is just not going to work 2/10; moderate amount of drainage. Patient reports he cannot get his stocking on over the dressing. I told him we have to do that the nurse gave him suggestions on how to make this work. The wound is on the bottom and lateral part of his left foot. Is cultured predominantly grew low amounts of Enterococcus, E. coli and anaerobes. There were multiple resistance genes detected including extended spectrum beta lactamase, quinolone, tetracycline. I could not think of an easy oral combination to address this so for now I am going to do topical antibiotics provided by [facility] I think the main agents here are vancomycin and an aminoglycoside. We have to be  able to give him access to the wounds to get the topical antibiotic on 2/17; moderate amount of drainage this is unchanged. He has his Keystone topical antibiotic against the deep tissue culture organisms. He has been using this and changing the dressing daily. Silver alginate on the wound surface. 2/24; using Keystone antibiotic with silver alginate on the top. He had too much drainage for a total contact cast at one point although I think that is improving and I think in the next week or 2 it might be possible to replace a total contact cast I did not do this today. In general the wound surface looks healthy however he continues to have thick rims of skin and subcutaneous tissue around the wide area of the circumference which I debrided 06/04/2020 upon evaluation today patient appears to be doing well in regard to his wound. I do feel like he is showing signs of improvement. There is little bit of callus and dead tissue around the edges of the wound as  well as what appears to be a little bit of a sinus tract that is off to the side laterally I would perform debridement to clear that away today. 3/17; left lateral foot. The wound looks about the same as I remember. Not much depth surface looks healthy. No evidence of infection 3/25; left lateral foot. Wound surface looks about the same. Separating epithelium from the circumference. There really is no evidence of infection here however not making progress by my view 3/29; left lateral foot. Surface of the wound again looks reasonably healthy still thick skin and subcutaneous tissue around the wound margins. There is no evidence of infection. One of the concerns being brought up by the nurses has again the amount of drainage vis--vis continued use of a total contact cast 4/5; left lateral foot at roughly the base of the fifth metatarsal. Nice healthy looking granulated tissue with rims of epithelialization. The overall wound measurements are not any better but the tissue looks healthy. The only concern is the amount of drainage although he has no surrounding maceration with what we have been doing recently to absorb fluid and protect his skin. He also has lymphedema. 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 He states that the kerlix/coban under the TCC rolled down and could not get it back up. He has been using an offloading scooter and has somehow hurt his right foot using this device. This happened last week. He states that the side of his right foot developed a blister and opened. The top of his foot also has a few small open wounds he thinks is due to his socks rubbing in his shoes. He has not been using any dressings to the wound. He denies purulent  drainage, fever/chills or erythema to the wounds. 4/22; patient presents for 1 week follow-up. He developed new wounds to the right foot that were evaluated at last clinic visit. He continues to have a total contact cast to the left leg and he reports no issues. He has been using silver collagen to the right foot wounds with no issues. He denies purulent drainage, fever/chills or erythema to the right foot wounds. He has no complaints today 4/25; patient presents for 1 week  follow-up. He has a total contact cast of the left leg and reports no issues. He has been using silver alginate to the right foot wound. He denies purulent drainage, fever/chills or erythema to the right foot wounds. 5/2 patient presents for 1 week follow-up. T contact cast on the left. The wound which is on the base of the plantar foot at the base of the fifth metatarsal otal actually looks quite good and dimensions continue to gradually contract. HOWEVER the area on the right lateral fifth metatarsal head is much larger than what I remember from 2 weeks ago. Once more is he has significant levels of hypergranulation. Noteworthy that he had this same hyper granulated response on his wound on the left foot at one point in time. So much so that he I thought there was an underlying fluid collection. Based on this I think this just needs debridement. 5/9; the wound on the left actually continues to be gradually smaller with a healthy surface. Slight amount of drainage and maceration of the skin around but not too bad. However he has a large wound over the right fifth metatarsal head very much in the same configuration as his left foot wound was initially. I used silver nitrate to address the hyper granulated tissue no mechanical debridement 5/16; area on the left foot did not look as healthy this week deeper thick surrounding macerated skin and subcutaneous tissue. oo The area on the right foot fifth met head was about the  same oo The area on the right ankle that we identified last week is completely broken down into an open wound presumably a stocking rubbing issue 5/23; patient has been using a total contact cast to the left side. He has been using silver alginate underneath. He has also been using silver alginate to the right foot wounds. He has no complaints today. He denies any signs of infection. 5/31; the left-sided wound looks some better measure smaller surface granulation looks better. We have been using silver alginate under the total contact cast oo The large area on his right fifth met head and right dorsal foot look about the same still using silver alginate 6/6; neither side is good as I was hoping although the surface area dimensions are better. A lot of maceration on his left and right foot around the wound edge. Area on the dorsal right foot looks better. He says he was traveling. I am not sure what does the amount of maceration around the plantar wounds may be drainage issues 6/13; in general the wound surfaces look quite good on both sides. Macerated skin and raised edges around the wound required debridement although in general especially on the left the surface area seems improved. oo The area on the right dorsal ankle is about the same I thought this would not be such a problem to close 6/20; not much change in either wound although the one on the right looks a little better. Both wounds have thick macerated edges to the skin requiring debridements. We have been using silver alginate. The area on the dorsal right ankle is still open I thought this would be closed. 6/28; patient comes in today with a marked deterioration in the right foot wound fifth met head. Wide area of exposed bone this is a drastic change from last time. The area on the left there we have been casting is stagnant. We have been using silver alginate in both wound areas. 7/5; bone culture I did for PCR last time  was positive  for Pseudomonas, group B strep, Enterococcus and Staph aureus. There was no suggestion of methicillin resistance or ampicillin resistant genes. This was resistant to tetracycline however He comes into the clinic today with the area over his right plantar fifth metatarsal head which had been doing so well 2 weeks ago completely necrotic feeling bone. I do not know that this is going to be salvageable. The left foot wound is certainly no smaller but it has a better surface and is superficial. 7/8; patient called in this morning to say that his total contact cast was rubbing against his foot. He states he is doing fine overall. He denies signs of infection. 7/12; continued deterioration in the wound over the right fifth metatarsal head crumbling bone. This is not going to be salvageable. The patient agrees and wants to be referred to Dr. Doran Durand which we will attempt to arrange as soon as possible. I am going to continue him on antibiotics as long as that takes so I will renew those today. The area on the left foot which is the base of the fifth metatarsal continues to look somewhat better. Healthy looking tissue no depth no debridement is necessary here. 7/20; the patient was kindly seen by Dr. Doran Durand of orthopedics on 10/19/2020. 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 Towards this end we have taken him out of the total contact cast on the left we will put him in a wrap with Hydrofera Blue. As I understand things surgery is planned for 7/21 7/27; patient had his surgery last Thursday. 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 Apparently everything went well we did not still disturb that today The area on the left foot actually looks quite good. He has been much less mobile which probably explains this he did not seem to do well in the total contact cast secondary to drainage and maceration I think. We have  been using Hydrofera Blue 11/09/2020 upon evaluation today patient appears to be doing well with regard to his plantar foot ulcer on the left foot. Fortunately there is no evidence of active infection at this time. No fevers, chills, nausea, vomiting, or diarrhea. Overall I think that he is actually doing extremely well. Nonetheless I do believe that he is staying off of this more following the surgery in his right foot that is the reason the left is doing so great. 8/16; left plantar foot wound. This looks smaller than the last time I saw this he is using Hydrofera Blue. The surgical wound on the right foot is being followed by Dr. Doran Durand we did not look at this today. He has surgical shoes on both feet Objective Constitutional Patient is hypertensive.. Pulse regular and within target range for patient.Marland Kitchen Respirations regular, non-labored and within target range.. Temperature is normal and within the target range for the patient.Marland Kitchen Appears in no distress. Vitals Time Taken: 3:02 PM, Height: 77 in, Weight: 280 lbs, BMI: 33.2, Temperature: 99.1 F, Pulse: 94 bpm, Respiratory Rate: 18 breaths/min, Blood Pressure: 143/96 mmHg, Capillary Blood Glucose: 134 mg/dl. Cardiovascular Pedal pulses are palpable. General Notes: Wound exam; left lateral plantar foot. The wound still has some depth but generally looks healthy. There is no evidence of surrounding infection. Integumentary (Hair, Skin) No erythema around the wound. Wound #3 status is Open. Original cause of wound was Trauma. The date acquired was: 10/02/2019. The  wound has been in treatment 55 weeks. The wound is located on the Left,Lateral Foot. The wound measures 1.6cm length x 0.7cm width x 0.2cm depth; 0.88cm^2 area and 0.176cm^3 volume. There is Fat Layer (Subcutaneous Tissue) exposed. There is no tunneling noted, however, there is undermining starting at 1:00 and ending at 4:00 with a maximum distance of 2cm. There is a medium amount of  serosanguineous drainage noted. The wound margin is distinct with the outline attached to the wound base. There is large (67-100%) pink granulation within the wound bed. There is no necrotic tissue within the wound bed. 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 - with Dr. Dellia [facility] Edema Control - Lymphedema / SCD / Other: Elevate legs to the level of the heart or above for 30 minutes daily and/or when sitting, a frequency of: - throughout the day Avoid standing for long periods of time. Exercise regularly Moisturize legs daily. - right leg every night before bed. Compression stocking or Garment 20-30 mm/Hg pressure to: - Apply to right leg in the morning and remove at night. Off-Loading: Open toe surgical shoe to: - bilateral feet WOUND #3: - Foot Wound Laterality: Left, Lateral Cleanser: Soap and Water Every Other Day/30 Days Discharge Instructions: May shower and wash wound with dial antibacterial soap and water prior to dressing change. Peri-Wound Care: Zinc Oxide Ointment 30g tube Every Other Day/30 Days Discharge Instructions: Apply Zinc Oxide to periwound with each dressing change as needed. Prim Dressing: Hydrofera Blue Classic Foam, 2x2 in (Generic) Every Other Day/30 Days ary Discharge Instructions: Moisten with saline prior to applying to wound bed Secondary Dressing: Woven Gauze Sponge, Non-Sterile 4x4 in (Generic) Every Other Day/30 Days Discharge Instructions: Apply over primary dressing as directed. Secondary Dressing: ABD Pad, 8x10 (Generic) Every Other Day/30 Days Discharge Instructions: T down before applying kerlix. Apply over primary dressing as directed. ape Secured With: Coban Self-Adherent Wrap 4x5 (in/yd)  Every Other Day/30 Days Discharge Instructions: Secure with Coban as directed. Secured With: [employer], 4.5x3.1 (in/yd) Every Other Day/30 Days Discharge Instructions: Secure with Kerlix as directed. 1. I am continuing with Hydrofera Blue 2. The school year is starting him once again I am concerned about the offloading of this area in a busy active school day. He tells me that his walking during the day is not going to be as extensive this year as it has been in the past nevertheless my concern remains. 3. He did not do well in a total contact cast Electronic Signature(s) Signed: 11/17/2020 4:53:39 PM By: [name] MD Entered By: [name] on 11/17/2020 16:34:10 -------------------------------------------------------------------------------- SuperBill Details Patient Name: Date of Service: [name], [name] 11/17/2020 Medical Record Number: [id number] Patient Account Number: [id number] Date of Birth/Sex: Treating RN: [birth year] (35 y.o. 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

## 2020-11-24 ENCOUNTER — Other Ambulatory Visit: Payer: Self-pay

## 2020-11-24 ENCOUNTER — Encounter (HOSPITAL_BASED_OUTPATIENT_CLINIC_OR_DEPARTMENT_OTHER): Payer: BC Managed Care – PPO | Admitting: Internal Medicine

## 2020-11-24 DIAGNOSIS — E11621 Type 2 diabetes mellitus with foot ulcer: Secondary | ICD-10-CM | POA: Diagnosis not present

## 2020-11-25 NOTE — Progress Notes (Signed)
[name] ([id number]) Visit Report for 11/24/2020 Debridement Details Patient Name: Date of Service: [name] 11/24/2020 12:30 PM Medical Record Number: [id number] Patient Account Number: [id number] Date of Birth/Sex: Treating RN: 1986-02-05 (35 y.o. [name], [name] Primary Care Provider: [name] Other Clinician: Referring Provider: Treating Provider/Extender: [name] in Treatment: 56 Debridement Performed for Assessment: Wound #3 Left,Lateral Foot Performed By: Physician [name], MD Debridement Type: Debridement Severity of Tissue Pre Debridement: Fat layer exposed Level of Consciousness (Pre-procedure): Awake and Alert Pre-procedure Verification/Time Out Yes - 13:20 Taken: Start Time: 13:20 Pain Control: Lidocaine T Area Debrided (L x W): otal 1.4 (cm) x 4.7 (cm) = 6.58 (cm) Tissue and other material debrided: Viable, Non-Viable, Slough, Subcutaneous, Skin: Dermis , Skin: Epidermis, Slough Level: Skin/Subcutaneous Tissue Debridement Description: Excisional Instrument: Blade, Forceps, Scissors Bleeding: Moderate Hemostasis Achieved: Silver Nitrate End Time: 13:20 Procedural Pain: 0 Post Procedural Pain: 0 Response to Treatment: Procedure was tolerated well Level of Consciousness (Post- Awake and Alert procedure): Post Debridement Measurements of Total Wound Length: (cm) 1.4 Width: (cm) 4.7 Depth: (cm) 0.4 Volume: (cm) 2.067 Character of Wound/Ulcer Post Debridement: Improved Severity of Tissue Post Debridement: Fat layer exposed Post Procedure Diagnosis Same as Pre-procedure Electronic Signature(s) Signed: 11/24/2020 5:31:05 PM By: Rhae Hammock RN Signed: 11/25/2020 8:02:04 AM By: [name] MD Entered By: [name] on 11/24/2020 13:32:18 -------------------------------------------------------------------------------- HPI Details Patient Name: Date of Service: [name], [name]  11/24/2020 12:30 PM Medical Record Number: [id number] Patient Account Number: [id number] Date of Birth/Sex: Treating RN: [birth year] (35 y.o. 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 He comes in for review of a wound over the plantar fifth metatarsal head extending into the lateral part of the foot. He was followed for this previously by his podiatrist Dr. Cornelius Moras. As the patient tells his story he went to see podiatry first for a swelling he developed on the lateral part of his fifth metatarsal head in May. He states this was "open" by podiatry and the area closed. He was followed up in June and it was again opened callus removed and it closed promptly. There were plans being made for surgery on the fifth metatarsal head in June however his blood sugar was apparently too high for anesthesia. Apparently the area was debrided and opened again in June and it is never closed since. Looking over the records from podiatry I am really not able to follow this. It was clear when he was first seen it was before 5/14 at that point he already had a wound. By 5/17 the ulcer was resolved. I do not see anything about a procedure. On 5/28 noted to have pre-ulcerative moderate keratosis. X-ray noted 1/5 contracted toe and tailor's bunion and metatarsal deformity. On a visit date on 09/28/2018 the dorsal part of the left foot it healed and resolved. There was concern about swelling in his lower extremity he was sent to the ER.. As far as I can tell he was seen in the ER on 7/12 with an ulcer on his left foot. A DVT rule out of the left leg was negative. I do not think I have complete records from podiatry but I am not able to verify the  procedures this patient states  he had. He states after the last procedure the wound has never closed although I am not able to follow this in the records I have from podiatry. He has not had a recent x-ray The patient has been using Neosporin on the wound. He is wearing a Darco shoe. He is still very active up on his foot working and exercising. Past medical history; type 2 diabetes ketosis-prone, leg swelling with a negative DVT study in July. Non-smoker ABI in our clinic was 0.85 on the left 10/16; substantial wound on the plantar left fifth met head extending laterally almost to the dorsal fifth MTP. We have been using silver alginate we gave him a Darco forefoot off loader. An x-ray did not show evidence of osteomyelitis did note soft tissue emphysema which I think was due to gas tracking through an open wound. There is no doubt in my mind he requires an MRI 10/23; MRI not booked until 3 November at the earliest this is largely due to his glucose sensor in the right arm. We have been using silver alginate. There has been an improvement 10/29; I am still not exactly sure when his MRI is booked for. He says it is the third but it is the 10th in epic. This definitely needs to be done. He is running a low-grade fever today but no other symptoms. No real improvement in the 1 02/26/2019 patient presents today for a follow-up visit here in our clinic he is last been seen in the clinic on October 29. Subsequently we were working on getting MRI to evaluate and see what exactly was going on and where we would need to go from the standpoint of whether or not he had osteomyelitis and again what treatments were going be required. Subsequently the patient ended up being admitted to the hospital on 02/07/2019 and was discharged on 02/14/2019. This is a somewhat interesting admission with a discharge diagnosis of pneumonia due to COVID-19 although he was positive for COVID-19 when tested at the urgent care but  negative x2 when he was actually in the hospital. With that being said he did have acute respiratory failure with hypoxia and it was noted he also have a left foot ulceration with osteomyelitis. With that being said he did require oxygen for his pneumonia and I level 4 L. He was placed on antivirals and steroids for the COVID-19. He was also transferred to the [facility] at one point. Nonetheless he did subsequently discharged home and since being home has done much better in that regard. The CT angiogram did not show any pulmonary embolism. With regard to the osteomyelitis the patient was placed on vancomycin and Zosyn while in the hospital but has been changed to Augmentin at discharge. It was also recommended that he follow- up with wound care and podiatry. Podiatry however wanted him to see [name] according to the patient prior to them doing anything further. His hemoglobin A1c was 9.9 as noted in the hospital. Have an MRI of the left foot performed while in the hospital on 02/04/2019. 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 There is an overlying plantar open wound noted an abscess tracking back along the lateral aspect of the fifth metatarsal shaft. There is otherwise diffuse cellulitis and mild fasciitis without findings of polymyositis. The patient did have recently pneumonia secondary to COVID-19 I looked in the chart through epic and it does appear that  the patient may need to have an additional x-ray just to ensure everything is cleared and that he has no airspace disease prior to putting him into the [name]. 03/05/2019; patient was readmitted to the clinic last week. He was hospitalized twice for a viral upper respiratory tract infection from 11/1 through 11/4 and then 11/5 through 11/12 ultimately this turned out to be Covid pneumonitis. Although he was discharged on oxygen he is not using  it. He says he feels fine. He has no exercise limitation no cough no sputum. His O2 sat in our clinic today was 100% on room air. 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 He received Vanco and Zosyn in the hospital and then was discharged on 2 weeks of Augmentin. I do not see any relevant cultures. He was supposed to follow-up with infectious disease but I do not see that he has an appointment. 12/8; patient saw Dr. Novella Olive of infectious disease last week. He felt that he had had adequate antibiotic therapy. He did not go to follow-up with Dr. Amalia Hailey of podiatry and I have again talked to him about the pros and cons of this. He does not want to consider a ray amputation of this time. He is aware of the risks of recurrence, migration etc. He started HBO today and tolerated this well. He can complete the Augmentin that I gave him last week. I have looked over the lab work that Dr. Chana Bode ordered his C-reactive protein was 3.3 and his sedimentation rate was 17. The C-reactive protein is never really been measurably that high in this patient 12/15; not much change in the wound today however he has undermining along the lateral part of the foot again more extensively than last week. He has some rims of epithelialization. We have been using silver alginate. He is undergoing hyperbarics but did not dive today 12/18; in for his obligatory first total contact cast change. Unfortunately there was pus coming from the undermining area around his fifth metatarsal head. This was cultured but will preclude reapplication of a cast. He is seen in conjunction with HBO 12/24; patient had staph lugdunensis in the wound in the undermining area laterally last time. We put him on doxycycline which should have covered this. The wound looks better today. I am going to give him another week of doxycycline before  reattempting the total contact cast 12/31; the patient is completing antibiotics. Hemorrhagic debris in the distal part of the wound with some undermining distally. He also had hyper granulation. Extensive debridement with a #5 curette. The infected area that was on the lateral part of the fifth met head is closed over. I do not think he needs any more antibiotics. Patient was seen prior to HBO. Preparations for a total contact cast were made in the cast will be placed post hyperbarics 04/11/19; once again the patient arrives today without complaint. He had been in a cast all week noted that he had heavy drainage this week. This resulted in large raised areas of macerated tissue around the wound 1/14; wound bed looks better slightly smaller. Hydrofera Blue has been changing himself. He had a heavy drainage last week which caused a lot of maceration around the wound so I took him out of a total contact cast he says the drainage is actually better this week He is seen today in conjunction with HBO 1/21; returns to  clinic. He was up in [place] for a day or 2 attending a funeral. He comes back in with the wound larger and with a large area of exposed bone. He had osteomyelitis and septic arthritis of the fifth left metatarsal head while he was in hospital. He received IV antibiotics in the hospital for a prolonged period of time then 3 weeks of Augmentin. Subsequently I gave him 2 weeks of doxycycline for more superficial wound infection. When I saw this last week the wound was smaller the surface of the wound looks satisfactory. 1/28; patient missed hyperbarics today. Bone biopsy I did last time showed Enterococcus faecalis and Staphylococcus lugdunensis . He has a wide area of exposed bone. We are going to use silver alginate as of today. I had another ethical discussion with the patient. 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 In this situation I think  the likelihood of healing this is low. Therefore I have recommended a ray amputation and with the patient's agreement I have referred him to Dr. Doran Durand. The other issue is that his compliance with hyperbarics has been minimal because of his work schedule and given his underlying decision I am going to stop this today READMISSION 10/24/2019 MRI 09/29/2019 left foot IMPRESSION: 1. Apparent skin ulceration inferior and lateral to the 5th metatarsal base with underlying heterogeneous T2 signal and enhancement in the subcutaneous fat. Small peripherally enhancing fluid collections along the plantar and lateral aspects of the 5th metatarsal base suspicious for abscesses. 2. Interval amputation through the mid 5th metatarsal with nonspecific low-level marrow edema and enhancement. Given the proximity to the adjacent soft tissue inflammatory changes, osteomyelitis cannot be excluded. 3. The additional bones appear unremarkable. MRI 09/29/2019 right foot IMPRESSION: 1. Soft tissue ulceration lateral to the 5th MTP joint. There is low-level T2 hyperintensity within the 4th and 5th metatarsal heads and adjacent proximal phalanges without abnormal T1 signal or cortical destruction. These findings are nonspecific and could be seen with early marrow edema, hyperemia or early osteomyelitis. No evidence of septic joint. 2. Mild tenosynovitis and synovial enhancement associated with the extensor digitorum tendons at the level of the midfoot. 3. Diffuse low-level muscular T2 hyperintensity and enhancement, most consistent with diabetic myopathy. LEFT FOOT BONE Methicillin resistant staphylococcus aureus Staphylococcus lugdunensis MIC MIC CIPROFLOXACIN >=8 RESISTANT Resistant <=0.5 SENSI... Sensitive CLINDAMYCIN <=0.25 SENS... Sensitive >=8 RESISTANT Resistant ERYTHROMYCIN >=8 RESISTANT Resistant >=8 RESISTANT Resistant GENTAMICIN <=0.5 SENSI... Sensitive <=0.5 SENSI... Sensitive Inducible Clindamycin  NEGATIVE Sensitive NEGATIVE Sensitive OXACILLIN >=4 RESISTANT Resistant 2 SENSITIVE Sensitive RIFAMPIN <=0.5 SENSI... Sensitive <=0.5 SENSI... Sensitive TETRACYCLINE <=1 SENSITIVE Sensitive <=1 SENSITIVE Sensitive TRIMETH/SULFA <=10 SENSIT Sensitive <=10 SENSIT Sensitive ... Marland Kitchen.. VANCOMYCIN 1 SENSITIVE Sensitive <=0.5 SENSI... Sensitive Right foot bone . Component 3 wk ago Specimen Description BONE Special Requests RIGHT 4 METATARSAL SAMPLE B Gram Stain NO WBC SEEN NO ORGANISMS SEEN Culture RARE METHICILLIN RESISTANT STAPHYLOCOCCUS AUREUS NO ANAEROBES ISOLATED Performed at [facility], [place] [address] [place], [place] [address] Report Status 10/08/2019 FINAL Organism ID, Bacteria METHICILLIN RESISTANT STAPHYLOCOCCUS AUREUS Resulting Agency [facility] Susceptibility Methicillin resistant staphylococcus aureus MIC CIPROFLOXACIN >=8 RESISTANT Resistant CLINDAMYCIN <=0.25 SENS... Sensitive ERYTHROMYCIN >=8 RESISTANT Resistant GENTAMICIN <=0.5 SENSI... Sensitive Inducible Clindamycin NEGATIVE Sensitive OXACILLIN >=4 RESISTANT Resistant RIFAMPIN <=0.5 SENSI... Sensitive TETRACYCLINE <=1 SENSITIVE Sensitive TRIMETH/SULFA <=10 SENSIT Sensitive ... VANCOMYCIN 1 SENSITIVE Sensitive This is a patient we had in clinic earlier this year with a wound over his left fifth metatarsal head. 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 Doran Durand on 05/16/2019. He seems to have developed problems with wounds on his bilateral feet in June 2021 although he may have had problems earlier than this. He was in an urgent care with a right foot ulcer on 09/26/2019 and given a course of doxycycline. This was apparently after  having trouble getting into see orthopedics. He was seen by podiatry on 09/28/2019 noted to have bilateral lower extremity ulcers including the left lateral fifth metatarsal base and the right subfifth met head. It was noted that had purulent drainage at that time. He required hospitalization from 6/20 through 7/2. This was because of worsening right foot wounds. He underwent bilateral operative incision and drainage and bone biopsies bilaterally. Culture results are listed above. He has been referred back to clinic by Dr. Jacqualyn Posey of podiatry. He is also followed by Dr. Megan Salon who saw him yesterday. He was discharged from hospital on Zyvox Flagyl and Levaquin and yesterday changed to doxycycline Flagyl and Levaquin. His inflammatory markers on 6/26 showed a sedimentation rate of 129 and a C-reactive protein of 5. This is improved to 14 and 1.3 respectively. This would indicate improvement. ABIs in our clinic today were 1.23 on the right and 1.20 on the left 11/01/2019 on evaluation today patient appears to be doing fairly well in regard to the wounds on his feet at this point. Fortunately there is no signs of active infection at this time. No fevers, chills, nausea, vomiting, or diarrhea. He currently is seeing infectious disease and still under their care at this point. Subsequently he also has both wounds which she has not been using collagen on as he did not receive that in his packaging he did not call us and let us know that. Apparently that just was missed on the order. Nonetheless we will get that straightened out today. 8/9-Patient returns for bilateral foot wounds, using Prisma with hydrogel moistened dressings, and the wounds appear stable. Patient using surgical shoes, avoiding much pressure or weightbearing as much as possible 8/16; patient has bilateral foot wounds. 1 on the right lateral foot proximally the other is on the left mid lateral foot. Both required debridement of callus  and thick skin around the wounds. We have been using silver collagen 8/27; patient has bilateral lateral foot wounds. The area on the left substantially surrounded by callus and dry skin. This was removed from the wound edge. The underlying wound is small. The area on the right measured somewhat smaller today. 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 Unfortunately I did not update his antibiotics during today's visit. 9/10 I reviewed Dr. Hale Bogus last notes he felt he had completed antibiotics his inflammatory markers were reasonably well controlled. He has a small wound on the lateral left foot and a tiny area on the right which is just above closed. He is using Hydrofera Blue with border foam he has bilateral surgical shoes 9/24; 2 week f/u. doing well. right foot is closed. left foot still undermined. 10/14; right foot remains closed at the fifth met head. The area over the base of the left fifth metatarsal has a small open area but considerable undermining towards the plantar  foot. Thick callus skin around this suggests an adequate pressure relief. We have talked about this. He says he is going to go back into his cam boot. I suggested a total contact cast he did not seem enamored with this suggestion 10/26; left foot base of the fifth metatarsal. Same condition as last time. He has skin over the area with an open wound however the skin is not adherent. He went to see Dr. Earleen Newport who did an x-ray and culture of his foot I have not reviewed the x-ray but the patient was not told anything. He is on doxycycline 11/11; since the patient was last here he was in the emergency room on 10/30 he was concerned about swelling in the left foot. They did not do any cultures or x-rays. They changed his antibiotics to cephalexin. Previous culture showed group B strep. The cephalexin is appropriate as doxycycline has less than predictable  coverage. Arrives in clinic today with swelling over this area under the wound. He also has a new wound on the right fifth metatarsal head 11/18; the patient has a difficult wound on the lateral aspect of the left fifth metatarsal head. The wound was almost ballotable last week I opened it slightly expecting to see purulence however there was just bleeding. I cultured this this was negative. X-ray unchanged. We are trying to get an MRI but I am not sure were going to be able to get this through his insurance. He also has an area on the right lateral fifth metatarsal head this looks healthier 12/3; the patient finally got our MRI. Surprisingly this did not show osteomyelitis. I did show the soft tissue ulceration at the lateral plantar aspect of the fifth metatarsal base with a tiny residual 6 mm abscess overlying the superficial fascia I have tried to culture this area I have not been able to get this to grow anything. Nevertheless the protruding tissue looks aggravated. I suspect we should try to treat the underlying "abscess with broad-spectrum antibiotics. I am going to start him on Levaquin and Flagyl. He has much less edema in his legs and I am going to continue to wrap his legs and see him weekly 12/10. I started Levaquin and Flagyl on him last week. He just picked up the Flagyl apparently there was some delay. The worry is the wound on the left fifth metatarsal base which is substantial and worsening. His foot looks like he inverts at the ankle making this a weightbearing surface. Certainly no improvement in fact I think the measurements of this are somewhat worse. We have been using 12/17; he apparently just got the Levaquin yesterday this is 2 weeks after the fact. He has completed the Flagyl. The area over the left fifth metatarsal base still has protruding granulation tissue although it does not look quite as bad as it did some weeks ago. He has severe bilateral lymphedema although we  have not been treating him for wounds on his legs this is definitely going to require compression. There was so much edema in the left I did not wish to put him in a total contact cast today. I am going to increase his compression from 3-4 layer. The area on the right lateral fifth met head actually look quite good and superficial. 12/23; patient arrived with callus on the right fifth met head and the substantial hyper granulated callused wound on the base of his fifth metatarsal. He says he is completing his Levaquin in 2 days but I  do not think that adds up with what I gave him but I will have to double check this. We are using Hydrofera Blue on both areas. My plan is to put the left leg in a cast the week after New Year's 04/06/2020; patient's wounds about the same. Right lateral fifth metatarsal head and left lateral foot over the base of the fifth metatarsal. There is undermining on the left lateral foot which I removed before application of total contact cast continuing with Hydrofera Blue new. Patient tells me he was seen by endocrinology today lab work was done [Dr. Kerr]. Also wondering whether he was referred to cardiology. I went over some lab work from previously does not have chronic renal failure certainly not nephrotic range proteinuria he does have very poorly controlled diabetes but this is not his most updated lab work. Hemoglobin A1c has been over 11 1/10; the patient had a considerable amount of leakage towards mid part of his left foot with macerated skin however the wound surface looks better the area on the right lateral fifth met head is better as well. I am going to change the dressing on the left foot under the total contact cast to silver alginate, continue with Hydrofera Blue on the right. 1/20; patient was in the total contact cast for 10 days. Considerable amount of drainage although the skin around the wound does not look too bad on the left foot. The area on the right  fifth metatarsal head is closed. Our nursing staff reports large amount of drainage out of the left lateral foot wound 1/25; continues with copious amounts of drainage described by our intake staff. PCR culture I did last week showed E. coli and Enterococcus faecalis and low quantities. Multiple resistance genes documented including extended spectrum beta lactamase, MRSA, MRSE, quinolone, tetracycline. The wound is not quite as good this week as it was 5 days ago but about the same size 2/3; continues with copious amounts of malodorous drainage per our intake nurse. The PCR culture I did 2 weeks ago showed E. coli and low quantities of Enterococcus. There were multiple resistance genes detected. I put Neosporin on him last week although this does not seem to have helped. The wound is slightly deeper today. Offloading continues to be an issue here although with the amount of drainage she has a total contact cast is just not going to work 2/10; moderate amount of drainage. Patient reports he cannot get his stocking on over the dressing. I told him we have to do that the nurse gave him suggestions on how to make this work. The wound is on the bottom and lateral part of his left foot. Is cultured predominantly grew low amounts of Enterococcus, E. coli and anaerobes. There were multiple resistance genes detected including extended spectrum beta lactamase, quinolone, tetracycline. I could not think of an easy oral combination to address this so for now I am going to do topical antibiotics provided by [facility] I think the main agents here are vancomycin and an aminoglycoside. We have to be able to give him access to the wounds to get the topical antibiotic on 2/17; moderate amount of drainage this is unchanged. He has his Keystone topical antibiotic against the deep tissue culture organisms. He has been using this and changing the dressing daily. Silver alginate on the wound surface. 2/24; using Keystone  antibiotic with silver alginate on the top. He had too much drainage for a total contact cast at one point although I  think that is improving and I think in the next week or 2 it might be possible to replace a total contact cast I did not do this today. In general the wound surface looks healthy however he continues to have thick rims of skin and subcutaneous tissue around the wide area of the circumference which I debrided 06/04/2020 upon evaluation today patient appears to be doing well in regard to his wound. I do feel like he is showing signs of improvement. There is little bit of callus and dead tissue around the edges of the wound as well as what appears to be a little bit of a sinus tract that is off to the side laterally I would perform debridement to clear that away today. 3/17; left lateral foot. The wound looks about the same as I remember. Not much depth surface looks healthy. No evidence of infection 3/25; left lateral foot. Wound surface looks about the same. Separating epithelium from the circumference. There really is no evidence of infection here however not making progress by my view 3/29; left lateral foot. Surface of the wound again looks reasonably healthy still thick skin and subcutaneous tissue around the wound margins. There is no evidence of infection. One of the concerns being brought up by the nurses has again the amount of drainage vis--vis continued use of a total contact cast 4/5; left lateral foot at roughly the base of the fifth metatarsal. Nice healthy looking granulated tissue with rims of epithelialization. The overall wound measurements are not any better but the tissue looks healthy. The only concern is the amount of drainage although he has no surrounding maceration with what we have been doing recently to absorb fluid and protect his skin. He also has lymphedema. 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 He states that the kerlix/coban under the TCC rolled down and could not get it back up. He has been using an offloading scooter and has somehow hurt his right foot using this device. This happened last week. He states that the side of his right foot developed a blister and opened. The top of his foot also has a few small open wounds he thinks is due to his socks rubbing in his shoes. He has not been using any dressings to the wound. He denies purulent drainage, fever/chills or erythema to the wounds. 4/22; patient presents for 1 week follow-up. He developed new wounds to the right foot that were evaluated at last clinic visit. He continues to have a total contact cast to the left leg and he reports no issues. He has been using silver collagen to the right foot wounds with no issues. He denies purulent drainage, fever/chills or erythema to the right foot wounds. He has no complaints today 4/25; patient presents for 1 week follow-up. He has a total contact cast of the left leg and reports no issues. He has been using silver alginate to the right foot wound. He denies purulent drainage, fever/chills or erythema to the right foot wounds. 5/2 patient presents for 1 week follow-up. T contact cast on the left. The wound which is on the base of the plantar foot at the base of the fifth metatarsal otal actually looks quite good and dimensions  continue to gradually contract. HOWEVER the area on the right lateral fifth metatarsal head is much larger than what I remember from 2 weeks ago. Once more is he has significant levels of hypergranulation. Noteworthy that he had this same hyper granulated response on his wound on the left foot at one point in time. So much so that he I thought there was an underlying fluid collection.  Based on this I think this just needs debridement. 5/9; the wound on the left actually continues to be gradually smaller with a healthy surface. Slight amount of drainage and maceration of the skin around but not too bad. However he has a large wound over the right fifth metatarsal head very much in the same configuration as his left foot wound was initially. I used silver nitrate to address the hyper granulated tissue no mechanical debridement 5/16; area on the left foot did not look as healthy this week deeper thick surrounding macerated skin and subcutaneous tissue. The area on the right foot fifth met head was about the same The area on the right ankle that we identified last week is completely broken down into an open wound presumably a stocking rubbing issue 5/23; patient has been using a total contact cast to the left side. He has been using silver alginate underneath. He has also been using silver alginate to the right foot wounds. He has no complaints today. He denies any signs of infection. 5/31; the left-sided wound looks some better measure smaller surface granulation looks better. We have been using silver alginate under the total contact cast The large area on his right fifth met head and right dorsal foot look about the same still using silver alginate 6/6; neither side is good as I was hoping although the surface area dimensions are better. A lot of maceration on his left and right foot around the wound edge. Area on the dorsal right foot looks better. He says he was traveling. I am not sure what does the amount of maceration around the plantar wounds may be drainage issues 6/13; in general the wound surfaces look quite good on both sides. Macerated skin and raised edges around the wound required debridement although in general especially on the left the surface area seems improved. The area on the right dorsal ankle is about the same I thought this would not be such a problem to  close 6/20; not much change in either wound although the one on the right looks a little better. Both wounds have thick macerated edges to the skin requiring debridements. We have been using silver alginate. The area on the dorsal right ankle is still open I thought this would be closed. 6/28; patient comes in today with a marked deterioration in the right foot wound fifth met head. Wide area of exposed bone this is a drastic change from last time. The area on the left there we have been casting is stagnant. We have been using silver alginate in both wound areas. 7/5; bone culture I did for PCR last time was positive for Pseudomonas, group B strep, Enterococcus and Staph aureus. There was no suggestion of methicillin resistance or ampicillin resistant genes. This was resistant to tetracycline however He comes into the clinic today with the area over his right plantar fifth metatarsal head which had been doing so well 2 weeks ago completely necrotic feeling bone. I do not know that this is going to be salvageable. The left foot wound is certainly no smaller but it has  a better surface and is superficial. 7/8; patient called in this morning to say that his total contact cast was rubbing against his foot. He states he is doing fine overall. He denies signs of infection. 7/12; continued deterioration in the wound over the right fifth metatarsal head crumbling bone. This is not going to be salvageable. The patient agrees and wants to be referred to Dr. Doran Durand which we will attempt to arrange as soon as possible. I am going to continue him on antibiotics as long as that takes so I will renew those today. The area on the left foot which is the base of the fifth metatarsal continues to look somewhat better. Healthy looking tissue no depth no debridement is necessary here. 7/20; the patient was kindly seen by Dr. Doran Durand of orthopedics on 10/19/2020. He agreed that he needed a ray amputation on the right and he  said he would have a look at the fourth as well while he was intraoperative. Towards this end we have taken him out of the total contact cast on the left we will put him in a wrap with Hydrofera Blue. As I understand things surgery is planned for 7/21 7/27; patient had his surgery last Thursday. 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 Apparently everything went well we did not still disturb that today The area on the left foot actually looks quite good. He has been much less mobile which probably explains this he did not seem to do well in the total contact cast secondary to drainage and maceration I think. 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 Fortunately there is no evidence of active infection at this time. No fevers, chills, nausea, vomiting, or diarrhea. Overall I think that he is actually doing extremely well. Nonetheless I do believe that he is staying off of this more following the surgery in his right foot that is the reason the left is doing so great. 8/16; left plantar foot wound. This looks smaller than the last time I saw this he is using Hydrofera Blue. The surgical wound on the right foot is being followed by Dr. Doran Durand we did not look at this today. He has surgical shoes on both feet 8/23; left plantar foot wound not as good this week. Surrounding macerated skin and subcutaneous tissue everything looks moist and wet. I do not think he is offloading this adequately. He is using a surgical shoe Apparently the right foot surgical wound is not open although I did not check his foot Electronic Signature(s) Signed: 11/25/2020 8:02:04 AM By: [name] MD Entered By: [name] on 11/24/2020 13:33:41 -------------------------------------------------------------------------------- Physical Exam Details Patient Name: Date of Service: [name], [name] 11/24/2020 12:30  PM Medical Record Number: [id number] Patient Account Number: [id number] Date of Birth/Sex: Treating RN: December 29, 1985 (35 y.o. [name] Primary Care Provider: [name] Other Clinician: Referring Provider: Treating Provider/Extender: [name] in Treatment: 56 Constitutional Sitting or standing Blood Pressure is within target range for patient.. Pulse regular and within target range for patient.Marland Kitchen Respirations regular, non-labored and within target range.. Temperature is normal and within the target range for the patient.Marland Kitchen Appears in no distress. Notes Wound exam; left plantar foot at the base of the fifth metatarsal. Completely nonviable macerated skin around a large part of the circumference. I removed skin and subcutaneous tissue with pickups and a #15  scalpel. Hemostasis with silver nitrate and a pressure dressing. There is no obvious soft tissue infection Electronic Signature(s) Signed: 11/25/2020 8:02:04 AM By: [name] MD Entered By: [name] on 11/24/2020 13:34:31 -------------------------------------------------------------------------------- Physician Orders Details Patient Name: Date of Service: [name], [name] 11/24/2020 12:30 PM Medical Record Number: [id number] Patient Account Number: [id number] Date of Birth/Sex: Treating RN: 1985/06/07 (35 y.o. [name] Primary Care Provider: [name] Other Clinician: Referring Provider: Treating Provider/Extender: [name] in Treatment: 20 Verbal / Phone Orders: No Diagnosis Coding Follow-up Appointments ppointment in 1 week. - with Dr. Dellia Nims Return A Edema Control - Lymphedema / SCD / Other Bilateral Lower Extremities Elevate legs to the level of the heart or above for 30 minutes daily and/or when sitting, a frequency of: - throughout the day Avoid standing for long periods of time. Exercise regularly Moisturize legs daily. -  right leg every night before bed. Compression stocking or Garment 20-30 mm/Hg pressure to: - Apply to right leg in the morning and remove at night. Off-Loading Open toe surgical shoe to: - bilateral feet Wound Treatment Wound #3 - Foot Wound Laterality: Left, Lateral Cleanser: Soap and Water Every Other Day/30 Days Discharge Instructions: May shower and wash wound with dial antibacterial soap and water prior to dressing change. Cleanser: Wound Cleanser (DME) (Generic) Every Other Day/30 Days Discharge Instructions: Cleanse the wound with wound cleanser prior to applying a clean dressing using gauze sponges, not tissue or cotton balls. Peri-Wound Care: Zinc Oxide Ointment 30g tube Every Other Day/30 Days Discharge Instructions: Apply Zinc Oxide to periwound with each dressing change as needed. Prim Dressing: KerraCel Ag Gelling Fiber Dressing, 4x5 in (silver alginate) (DME) (Generic) Every Other Day/30 Days ary Discharge Instructions: Apply silver alginate to wound bed as instructed Secondary Dressing: Woven Gauze Sponge, Non-Sterile 4x4 in (DME) (Generic) Every Other Day/30 Days Discharge Instructions: Apply over primary dressing as directed. Secondary Dressing: Zetuvit Plus 4x8 in (DME) (Generic) Every Other Day/30 Days Discharge Instructions: Apply over primary dressing as directed. Secondary Dressing: Optifoam Non-Adhesive Dressing, 4x4 in (DME) (Generic) Every Other Day/30 Days Discharge Instructions: Apply over primary dressing as directed. Secured With: Coban Self-Adherent Wrap 4x5 (in/yd) (DME) (Generic) Every Other Day/30 Days Discharge Instructions: Secure with Coban as directed. Secured With: [employer], 4.5x3.1 (in/yd) (DME) (Generic) Every Other Day/30 Days Discharge Instructions: Secure with Kerlix as directed. Secured With: 42M Medipore H Soft Cloth Surgical T 4 x 2 (in/yd) (DME) (Generic) Every Other Day/30 Days ape Discharge Instructions: Secure dressing with tape  as directed. Electronic Signature(s) Signed: 11/24/2020 5:31:05 PM By: Rhae Hammock RN Signed: 11/25/2020 8:02:04 AM By: [name] MD Entered By: Rhae Hammock on 11/24/2020 13:30:48 -------------------------------------------------------------------------------- Problem List Details Patient Name: Date of Service: [name], [name] 11/24/2020 12:30 PM Medical Record Number: [id number] Patient Account Number: [id number] Date of Birth/Sex: Treating RN: 1985-11-09 (35 y.o. 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, right ankle and foot 10/24/2019 10/24/2019 L97.318 Non-pressure chronic ulcer of right  ankle with other specified severity 08/10/2020 08/10/2020 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, left ankle and foot 10/24/2019 10/24/2019 B95.62 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 10/24/2019 10/24/2019 classified elsewhere L97.518 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ity 07/14/2020 07/14/2020 Resolved Problems Electronic Signature(s) Signed: 11/25/2020 8:02:04 AM By: [name] MD Entered By: [name] on 11/24/2020 13:30:52 -------------------------------------------------------------------------------- Progress Note Details Patient Name: Date of Service: [name], [name]  11/24/2020 12:30 PM Medical Record Number: [id number] Patient Account Number: [id number] Date of Birth/Sex: Treating RN: 18-Mar-1986 (35 y.o. 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 He comes in for review of a wound over the plantar fifth metatarsal head extending into the lateral part of the foot. He was followed for this previously by his podiatrist Dr. Cornelius Moras. As the patient tells his story he went to see podiatry first for a swelling he developed on the lateral part of his fifth metatarsal head in May. He states this was "open" by podiatry and the area closed. He was followed up in June and it was again opened callus removed and it closed promptly. There were plans being made for surgery on the fifth metatarsal head in June however his blood sugar was apparently too high for anesthesia. Apparently the area was debrided and opened again in June and it is never closed since. Looking over the records from podiatry I am really not able to follow this. It was clear when he was first seen it was before 5/14 at that point he already had a wound. By 5/17 the ulcer was resolved. I do not see anything about a procedure. On 5/28 noted to have pre-ulcerative moderate keratosis. X-ray noted 1/5 contracted toe and tailor's bunion and metatarsal deformity. On a visit date on 09/28/2018 the dorsal part of the left foot it healed and resolved. There was concern about swelling in his lower extremity he was sent to the ER.. As far as I can tell he was seen in the ER on 7/12 with an ulcer on his left foot. A DVT rule out of the left leg was negative. I do not think I have complete records from podiatry but I am not able to verify the  procedures this patient states he had. He states after the last procedure the wound has never closed although I am not able to follow this in the records I have from podiatry. He has not had a recent x-ray The patient has been using Neosporin on the wound. He is wearing a Darco shoe. He is still very active up on his foot working and exercising. Past medical history; type 2 diabetes ketosis-prone, leg swelling with a negative DVT study in July. Non-smoker ABI in our clinic was 0.85 on the left 10/16; substantial wound on the plantar left fifth met head extending laterally almost to the dorsal fifth MTP. We have been using silver alginate we gave him a Darco forefoot off loader. An x-ray did not show evidence of osteomyelitis did note soft tissue emphysema which I think was due to gas tracking through an open wound. There is no doubt in my mind he requires an MRI 10/23; MRI not booked until 3 November at the earliest this is largely due to  his glucose sensor in the right arm. We have been using silver alginate. There has been an improvement 10/29; I am still not exactly sure when his MRI is booked for. He says it is the third but it is the 10th in epic. This definitely needs to be done. He is running a low-grade fever today but no other symptoms. No real improvement in the 1 02/26/2019 patient presents today for a follow-up visit here in our clinic he is last been seen in the clinic on October 29. Subsequently we were working on getting MRI to evaluate and see what exactly was going on and where we would need to go from the standpoint of whether or not he had osteomyelitis and again what treatments were going be required. Subsequently the patient ended up being admitted to the hospital on 02/07/2019 and was discharged on 02/14/2019. This is a somewhat interesting admission with a discharge diagnosis of pneumonia due to COVID-19 although he was positive for COVID-19 when tested at the urgent care but  negative x2 when he was actually in the hospital. With that being said he did have acute respiratory failure with hypoxia and it was noted he also have a left foot ulceration with osteomyelitis. With that being said he did require oxygen for his pneumonia and I level 4 L. He was placed on antivirals and steroids for the COVID-19. He was also transferred to the [facility] at one point. Nonetheless he did subsequently discharged home and since being home has done much better in that regard. The CT angiogram did not show any pulmonary embolism. With regard to the osteomyelitis the patient was placed on vancomycin and Zosyn while in the hospital but has been changed to Augmentin at discharge. It was also recommended that he follow- up with wound care and podiatry. Podiatry however wanted him to see [name] according to the patient prior to them doing anything further. His hemoglobin A1c was 9.9 as noted in the hospital. Have an MRI of the left foot performed while in the hospital on 02/04/2019. 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 There is an overlying plantar open wound noted an abscess tracking back along the lateral aspect of the fifth metatarsal shaft. There is otherwise diffuse cellulitis and mild fasciitis without findings of polymyositis. The patient did have recently pneumonia secondary to COVID-19 I looked in the chart through epic and it does appear that the patient may need to have an additional x-ray just to ensure everything is cleared and that he has no airspace disease prior to putting him into the [name]. 03/05/2019; patient was readmitted to the clinic last week. He was hospitalized twice for a viral upper respiratory tract infection from 11/1 through 11/4 and then 11/5 through 11/12 ultimately this turned out to be Covid pneumonitis. Although he was discharged on oxygen he is not using  it. He says he feels fine. He has no exercise limitation no cough no sputum. His O2 sat in our clinic today was 100% on room air. 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 He received Vanco and Zosyn in the hospital and then was discharged on 2 weeks of Augmentin. I do not see any relevant cultures. He was supposed to follow-up with infectious disease but I do not see that he has an  appointment. 12/8; patient saw Dr. Novella Olive of infectious disease last week. He felt that he had had adequate antibiotic therapy. He did not go to follow-up with Dr. Amalia Hailey of podiatry and I have again talked to him about the pros and cons of this. He does not want to consider a ray amputation of this time. He is aware of the risks of recurrence, migration etc. He started HBO today and tolerated this well. He can complete the Augmentin that I gave him last week. I have looked over the lab work that Dr. Chana Bode ordered his C-reactive protein was 3.3 and his sedimentation rate was 17. The C-reactive protein is never really been measurably that high in this patient 12/15; not much change in the wound today however he has undermining along the lateral part of the foot again more extensively than last week. He has some rims of epithelialization. We have been using silver alginate. He is undergoing hyperbarics but did not dive today 12/18; in for his obligatory first total contact cast change. Unfortunately there was pus coming from the undermining area around his fifth metatarsal head. This was cultured but will preclude reapplication of a cast. He is seen in conjunction with HBO 12/24; patient had staph lugdunensis in the wound in the undermining area laterally last time. We put him on doxycycline which should have covered this. The wound looks better today. I am going to give him another week of doxycycline before  reattempting the total contact cast 12/31; the patient is completing antibiotics. Hemorrhagic debris in the distal part of the wound with some undermining distally. He also had hyper granulation. Extensive debridement with a #5 curette. The infected area that was on the lateral part of the fifth met head is closed over. I do not think he needs any more antibiotics. Patient was seen prior to HBO. Preparations for a total contact cast were made in the cast will be placed post hyperbarics 04/11/19; once again the patient arrives today without complaint. He had been in a cast all week noted that he had heavy drainage this week. This resulted in large raised areas of macerated tissue around the wound 1/14; wound bed looks better slightly smaller. Hydrofera Blue has been changing himself. He had a heavy drainage last week which caused a lot of maceration around the wound so I took him out of a total contact cast he says the drainage is actually better this week He is seen today in conjunction with HBO 1/21; returns to clinic. He was up in [place] for a day or 2 attending a funeral. He comes back in with the wound larger and with a large area of exposed bone. He had osteomyelitis and septic arthritis of the fifth left metatarsal head while he was in hospital. He received IV antibiotics in the hospital for a prolonged period of time then 3 weeks of Augmentin. Subsequently I gave him 2 weeks of doxycycline for more superficial wound infection. When I saw this last week the wound was smaller the surface of the wound looks satisfactory. 1/28; patient missed hyperbarics today. Bone biopsy I did last time showed Enterococcus faecalis and Staphylococcus lugdunensis . He has a wide area of exposed bone. We are going to use silver alginate as of today. I had another ethical discussion with the patient. 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 In this situation I think  the likelihood of healing this is low. Therefore I have recommended a ray amputation and  with the patient's agreement I have referred him to Dr. Doran Durand. The other issue is that his compliance with hyperbarics has been minimal because of his work schedule and given his underlying decision I am going to stop this today READMISSION 10/24/2019 MRI 09/29/2019 left foot IMPRESSION: 1. Apparent skin ulceration inferior and lateral to the 5th metatarsal base with underlying heterogeneous T2 signal and enhancement in the subcutaneous fat. Small peripherally enhancing fluid collections along the plantar and lateral aspects of the 5th metatarsal base suspicious for abscesses. 2. Interval amputation through the mid 5th metatarsal with nonspecific low-level marrow edema and enhancement. Given the proximity to the adjacent soft tissue inflammatory changes, osteomyelitis cannot be excluded. 3. The additional bones appear unremarkable. MRI 09/29/2019 right foot IMPRESSION: 1. Soft tissue ulceration lateral to the 5th MTP joint. There is low-level T2 hyperintensity within the 4th and 5th metatarsal heads and adjacent proximal phalanges without abnormal T1 signal or cortical destruction. These findings are nonspecific and could be seen with early marrow edema, hyperemia or early osteomyelitis. No evidence of septic joint. 2. Mild tenosynovitis and synovial enhancement associated with the extensor digitorum tendons at the level of the midfoot. 3. Diffuse low-level muscular T2 hyperintensity and enhancement, most consistent with diabetic myopathy. LEFT FOOT BONE Methicillin resistant staphylococcus aureus Staphylococcus lugdunensis MIC MIC CIPROFLOXACIN >=8 RESISTANT Resistant <=0.5 SENSI... Sensitive CLINDAMYCIN <=0.25 SENS... Sensitive >=8 RESISTANT Resistant ERYTHROMYCIN >=8 RESISTANT Resistant >=8 RESISTANT Resistant GENTAMICIN <=0.5 SENSI... Sensitive <=0.5 SENSI... Sensitive Inducible Clindamycin  NEGATIVE Sensitive NEGATIVE Sensitive OXACILLIN >=4 RESISTANT Resistant 2 SENSITIVE Sensitive RIFAMPIN <=0.5 SENSI... Sensitive <=0.5 SENSI... Sensitive TETRACYCLINE <=1 SENSITIVE Sensitive <=1 SENSITIVE Sensitive TRIMETH/SULFA <=10 SENSIT Sensitive <=10 SENSIT Sensitive ... Marland Kitchen.. VANCOMYCIN 1 SENSITIVE Sensitive <=0.5 SENSI... Sensitive Right foot bone . Component 3 wk ago Specimen Description BONE Special Requests RIGHT 4 METATARSAL SAMPLE B Gram Stain NO WBC SEEN NO ORGANISMS SEEN Culture RARE METHICILLIN RESISTANT STAPHYLOCOCCUS AUREUS NO ANAEROBES ISOLATED Performed at [facility], [place] [address], [place], [place] [address] Report Status 10/08/2019 FINAL Organism ID, Bacteria METHICILLIN RESISTANT STAPHYLOCOCCUS AUREUS Resulting Agency [facility] Susceptibility Methicillin resistant staphylococcus aureus MIC CIPROFLOXACIN >=8 RESISTANT Resistant CLINDAMYCIN <=0.25 SENS... Sensitive ERYTHROMYCIN >=8 RESISTANT Resistant GENTAMICIN <=0.5 SENSI... Sensitive Inducible Clindamycin NEGATIVE Sensitive OXACILLIN >=4 RESISTANT Resistant RIFAMPIN <=0.5 SENSI... Sensitive TETRACYCLINE <=1 SENSITIVE Sensitive TRIMETH/SULFA <=10 SENSIT Sensitive ... VANCOMYCIN 1 SENSITIVE Sensitive This is a patient we had in clinic earlier this year with a wound over his left fifth metatarsal head. 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 Doran Durand on 05/16/2019. He seems to have developed problems with wounds on his bilateral feet in June 2021 although he may have had problems earlier than this. He was in an urgent care with a right foot ulcer on 09/26/2019 and given a course of doxycycline. This was apparently after  having trouble getting into see orthopedics. He was seen by podiatry on 09/28/2019 noted to have bilateral lower extremity ulcers including the left lateral fifth metatarsal base and the right subfifth met head. It was noted that had purulent drainage at that time. He required hospitalization from 6/20 through 7/2. This was because of worsening right foot wounds. He underwent bilateral operative incision and drainage and bone biopsies bilaterally. Culture  results are listed above. He has been referred back to clinic by Dr. Jacqualyn Posey of podiatry. He is also followed by Dr. Megan Salon who saw him yesterday. He was discharged from hospital on Zyvox Flagyl and Levaquin and yesterday changed to doxycycline Flagyl and Levaquin. His inflammatory markers on 6/26 showed a sedimentation rate of 129 and a C-reactive protein of 5. This is improved to 14 and 1.3 respectively. This would indicate improvement. ABIs in our clinic today were 1.23 on the right and 1.20 on the left 11/01/2019 on evaluation today patient appears to be doing fairly well in regard to the wounds on his feet at this point. Fortunately there is no signs of active infection at this time. No fevers, chills, nausea, vomiting, or diarrhea. He currently is seeing infectious disease and still under their care at this point. Subsequently he also has both wounds which she has not been using collagen on as he did not receive that in his packaging he did not call us and let us know that. Apparently that just was missed on the order. Nonetheless we will get that straightened out today. 8/9-Patient returns for bilateral foot wounds, using Prisma with hydrogel moistened dressings, and the wounds appear stable. Patient using surgical shoes, avoiding much pressure or weightbearing as much as possible 8/16; patient has bilateral foot wounds. 1 on the right lateral foot proximally the other is on the left mid lateral foot. Both required debridement of callus  and thick skin around the wounds. We have been using silver collagen 8/27; patient has bilateral lateral foot wounds. The area on the left substantially surrounded by callus and dry skin. This was removed from the wound edge. The underlying wound is small. The area on the right measured somewhat smaller today. 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 Unfortunately I did not update his antibiotics during today's visit. 9/10 I reviewed Dr. Hale Bogus last notes he felt he had completed antibiotics his inflammatory markers were reasonably well controlled. He has a small wound on the lateral left foot and a tiny area on the right which is just above closed. He is using Hydrofera Blue with border foam he has bilateral surgical shoes 9/24; 2 week f/u. doing well. right foot is closed. left foot still undermined. 10/14; right foot remains closed at the fifth met head. The area over the base of the left fifth metatarsal has a small open area but considerable undermining towards the plantar foot. Thick callus skin around this suggests an adequate pressure relief. We have talked about this. He says he is going to go back into his cam boot. I suggested a total contact cast he did not seem enamored with this suggestion 10/26; left foot base of the fifth metatarsal. Same condition as last time. He has skin over the area with an open wound however the skin is not adherent. He went to see Dr. Earleen Newport who did an x-ray and culture of his foot I have not reviewed the x-ray but the patient was not told anything. He is on doxycycline 11/11; since the patient was last here he was in the emergency room on 10/30 he was concerned about swelling in the left foot. They did not do any cultures or x-rays. They changed his antibiotics to cephalexin. Previous culture showed group B strep. The cephalexin is appropriate as doxycycline has less than predictable  coverage. Arrives in clinic today with swelling over this area under the wound.  He also has a new wound on the right fifth metatarsal head 11/18; the patient has a difficult wound on the lateral aspect of the left fifth metatarsal head. The wound was almost ballotable last week I opened it slightly expecting to see purulence however there was just bleeding. I cultured this this was negative. X-ray unchanged. We are trying to get an MRI but I am not sure were going to be able to get this through his insurance. He also has an area on the right lateral fifth metatarsal head this looks healthier 12/3; the patient finally got our MRI. Surprisingly this did not show osteomyelitis. I did show the soft tissue ulceration at the lateral plantar aspect of the fifth metatarsal base with a tiny residual 6 mm abscess overlying the superficial fascia I have tried to culture this area I have not been able to get this to grow anything. Nevertheless the protruding tissue looks aggravated. I suspect we should try to treat the underlying "abscess with broad-spectrum antibiotics. I am going to start him on Levaquin and Flagyl. He has much less edema in his legs and I am going to continue to wrap his legs and see him weekly 12/10. I started Levaquin and Flagyl on him last week. He just picked up the Flagyl apparently there was some delay. The worry is the wound on the left fifth metatarsal base which is substantial and worsening. His foot looks like he inverts at the ankle making this a weightbearing surface. Certainly no improvement in fact I think the measurements of this are somewhat worse. We have been using 12/17; he apparently just got the Levaquin yesterday this is 2 weeks after the fact. He has completed the Flagyl. The area over the left fifth metatarsal base still has protruding granulation tissue although it does not look quite as bad as it did some weeks ago. He has severe bilateral lymphedema although we  have not been treating him for wounds on his legs this is definitely going to require compression. There was so much edema in the left I did not wish to put him in a total contact cast today. I am going to increase his compression from 3-4 layer. The area on the right lateral fifth met head actually look quite good and superficial. 12/23; patient arrived with callus on the right fifth met head and the substantial hyper granulated callused wound on the base of his fifth metatarsal. He says he is completing his Levaquin in 2 days but I do not think that adds up with what I gave him but I will have to double check this. We are using Hydrofera Blue on both areas. My plan is to put the left leg in a cast the week after New Year's 04/06/2020; patient's wounds about the same. Right lateral fifth metatarsal head and left lateral foot over the base of the fifth metatarsal. There is undermining on the left lateral foot which I removed before application of total contact cast continuing with Hydrofera Blue new. Patient tells me he was seen by endocrinology today lab work was done [Dr. Kerr]. Also wondering whether he was referred to cardiology. I went over some lab work from previously does not have chronic renal failure certainly not nephrotic range proteinuria he does have very poorly controlled diabetes but this is not his most updated lab work. Hemoglobin A1c has been over 11 1/10; the patient had a considerable amount of leakage towards mid part of his left foot with macerated skin however  the wound surface looks better the area on the right lateral fifth met head is better as well. I am going to change the dressing on the left foot under the total contact cast to silver alginate, continue with Hydrofera Blue on the right. 1/20; patient was in the total contact cast for 10 days. Considerable amount of drainage although the skin around the wound does not look too bad on the left foot. The area on the right  fifth metatarsal head is closed. Our nursing staff reports large amount of drainage out of the left lateral foot wound 1/25; continues with copious amounts of drainage described by our intake staff. PCR culture I did last week showed E. coli and Enterococcus faecalis and low quantities. Multiple resistance genes documented including extended spectrum beta lactamase, MRSA, MRSE, quinolone, tetracycline. The wound is not quite as good this week as it was 5 days ago but about the same size 2/3; continues with copious amounts of malodorous drainage per our intake nurse. The PCR culture I did 2 weeks ago showed E. coli and low quantities of Enterococcus. There were multiple resistance genes detected. I put Neosporin on him last week although this does not seem to have helped. The wound is slightly deeper today. Offloading continues to be an issue here although with the amount of drainage she has a total contact cast is just not going to work 2/10; moderate amount of drainage. Patient reports he cannot get his stocking on over the dressing. I told him we have to do that the nurse gave him suggestions on how to make this work. The wound is on the bottom and lateral part of his left foot. Is cultured predominantly grew low amounts of Enterococcus, E. coli and anaerobes. There were multiple resistance genes detected including extended spectrum beta lactamase, quinolone, tetracycline. I could not think of an easy oral combination to address this so for now I am going to do topical antibiotics provided by [facility] I think the main agents here are vancomycin and an aminoglycoside. We have to be able to give him access to the wounds to get the topical antibiotic on 2/17; moderate amount of drainage this is unchanged. He has his Keystone topical antibiotic against the deep tissue culture organisms. He has been using this and changing the dressing daily. Silver alginate on the wound surface. 2/24; using Keystone  antibiotic with silver alginate on the top. He had too much drainage for a total contact cast at one point although I think that is improving and I think in the next week or 2 it might be possible to replace a total contact cast I did not do this today. In general the wound surface looks healthy however he continues to have thick rims of skin and subcutaneous tissue around the wide area of the circumference which I debrided 06/04/2020 upon evaluation today patient appears to be doing well in regard to his wound. I do feel like he is showing signs of improvement. There is little bit of callus and dead tissue around the edges of the wound as well as what appears to be a little bit of a sinus tract that is off to the side laterally I would perform debridement to clear that away today. 3/17; left lateral foot. The wound looks about the same as I remember. Not much depth surface looks healthy. No evidence of infection 3/25; left lateral foot. Wound surface looks about the same. Separating epithelium from the circumference. There really is no evidence  of infection here however not making progress by my view 3/29; left lateral foot. Surface of the wound again looks reasonably healthy still thick skin and subcutaneous tissue around the wound margins. There is no evidence of infection. One of the concerns being brought up by the nurses has again the amount of drainage vis--vis continued use of a total contact cast 4/5; left lateral foot at roughly the base of the fifth metatarsal. Nice healthy looking granulated tissue with rims of epithelialization. The overall wound measurements are not any better but the tissue looks healthy. The only concern is the amount of drainage although he has no surrounding maceration with what we have been doing recently to absorb fluid and protect his skin. He also has lymphedema. 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 He states that the kerlix/coban under the TCC rolled down and could not get it back up. He has been using an offloading scooter and has somehow hurt his right foot using this device. This happened last week. He states that the side of his right foot developed a blister and opened. The top of his foot also has a few small open wounds he thinks is due to his socks rubbing in his shoes. He has not been using any dressings to the wound. He denies purulent drainage, fever/chills or erythema to the wounds. 4/22; patient presents for 1 week follow-up. He developed new wounds to the right foot that were evaluated at last clinic visit. He continues to have a total contact cast to the left leg and he reports no issues. He has been using silver collagen to the right foot wounds with no issues. He denies purulent drainage, fever/chills or erythema to the right foot wounds. He has no complaints today 4/25; patient presents for 1 week follow-up. He has a total contact cast of the left leg and reports no issues. He has been using silver alginate to the right foot wound. He denies purulent drainage, fever/chills or erythema to the right foot wounds. 5/2 patient presents for 1 week follow-up. T contact cast on the left. The wound which is on the base of the plantar foot at the base of the fifth metatarsal otal actually looks quite good and dimensions continue to gradually contract. HOWEVER the area on the right lateral fifth metatarsal head is much larger than what I remember from 2 weeks ago. Once more is he has significant levels of hypergranulation. Noteworthy that he had this same hyper granulated response on his wound on the left foot at one point in time. So much so that he I thought there was an underlying fluid collection.  Based on this I think this just needs debridement. 5/9; the wound on the left actually continues to be gradually smaller with a healthy surface. Slight amount of drainage and maceration of the skin around but not too bad. However he has a large wound over the right fifth metatarsal head very much in the same configuration as his left foot wound was initially. I used silver nitrate to address the hyper granulated tissue no mechanical debridement 5/16; area on the left foot did not look as healthy this week deeper thick surrounding macerated skin and subcutaneous tissue. oo The area on  the right foot fifth met head was about the same oo The area on the right ankle that we identified last week is completely broken down into an open wound presumably a stocking rubbing issue 5/23; patient has been using a total contact cast to the left side. He has been using silver alginate underneath. He has also been using silver alginate to the right foot wounds. He has no complaints today. He denies any signs of infection. 5/31; the left-sided wound looks some better measure smaller surface granulation looks better. We have been using silver alginate under the total contact cast oo The large area on his right fifth met head and right dorsal foot look about the same still using silver alginate 6/6; neither side is good as I was hoping although the surface area dimensions are better. A lot of maceration on his left and right foot around the wound edge. Area on the dorsal right foot looks better. He says he was traveling. I am not sure what does the amount of maceration around the plantar wounds may be drainage issues 6/13; in general the wound surfaces look quite good on both sides. Macerated skin and raised edges around the wound required debridement although in general especially on the left the surface area seems improved. oo The area on the right dorsal ankle is about the same I thought this would not be such  a problem to close 6/20; not much change in either wound although the one on the right looks a little better. Both wounds have thick macerated edges to the skin requiring debridements. We have been using silver alginate. The area on the dorsal right ankle is still open I thought this would be closed. 6/28; patient comes in today with a marked deterioration in the right foot wound fifth met head. Wide area of exposed bone this is a drastic change from last time. The area on the left there we have been casting is stagnant. We have been using silver alginate in both wound areas. 7/5; bone culture I did for PCR last time was positive for Pseudomonas, group B strep, Enterococcus and Staph aureus. There was no suggestion of methicillin resistance or ampicillin resistant genes. This was resistant to tetracycline however He comes into the clinic today with the area over his right plantar fifth metatarsal head which had been doing so well 2 weeks ago completely necrotic feeling bone. I do not know that this is going to be salvageable. The left foot wound is certainly no smaller but it has a better surface and is superficial. 7/8; patient called in this morning to say that his total contact cast was rubbing against his foot. He states he is doing fine overall. He denies signs of infection. 7/12; continued deterioration in the wound over the right fifth metatarsal head crumbling bone. This is not going to be salvageable. The patient agrees and wants to be referred to Dr. Doran Durand which we will attempt to arrange as soon as possible. I am going to continue him on antibiotics as long as that takes so I will renew those today. The area on the left foot which is the base of the fifth metatarsal continues to look somewhat better. Healthy looking tissue no depth no debridement is necessary here. 7/20; the patient was kindly seen by Dr. Doran Durand of orthopedics on 10/19/2020. He agreed that he needed a ray amputation on the  right and he said he would have a look at the fourth as well while he was  intraoperative. Towards this end we have taken him out of the total contact cast on the left we will put him in a wrap with Hydrofera Blue. As I understand things surgery is planned for 7/21 7/27; patient had his surgery last Thursday. 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 Apparently everything went well we did not still disturb that today The area on the left foot actually looks quite good. He has been much less mobile which probably explains this he did not seem to do well in the total contact cast secondary to drainage and maceration I think. 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 Fortunately there is no evidence of active infection at this time. No fevers, chills, nausea, vomiting, or diarrhea. Overall I think that he is actually doing extremely well. Nonetheless I do believe that he is staying off of this more following the surgery in his right foot that is the reason the left is doing so great. 8/16; left plantar foot wound. This looks smaller than the last time I saw this he is using Hydrofera Blue. The surgical wound on the right foot is being followed by Dr. Doran Durand we did not look at this today. He has surgical shoes on both feet 8/23; left plantar foot wound not as good this week. Surrounding macerated skin and subcutaneous tissue everything looks moist and wet. I do not think he is offloading this adequately. He is using a surgical shoe Apparently the right foot surgical wound is not open although I did not check his foot Objective Constitutional Sitting or standing Blood Pressure is within target range for patient.. Pulse regular and within target range for patient.Marland Kitchen Respirations regular, non-labored and within target range.. Temperature is normal and within the target range for the patient.Marland Kitchen Appears  in no distress. Vitals Time Taken: 12:49 PM, Height: 77 in, Source: Stated, Weight: 280 lbs, Source: Stated, BMI: 33.2, Temperature: 99.2 F, Pulse: 89 bpm, Respiratory Rate: 18 breaths/min, Blood Pressure: 146/89 mmHg, Capillary Blood Glucose: 84 mg/dl. General Notes: glucose per pt report this am General Notes: Wound exam; left plantar foot at the base of the fifth metatarsal. Completely nonviable macerated skin around a large part of the circumference. I removed skin and subcutaneous tissue with pickups and a #15 scalpel. Hemostasis with silver nitrate and a pressure dressing. There is no obvious soft tissue infection Integumentary (Hair, Skin) Wound #3 status is Open. Original cause of wound was Trauma. The date acquired was: 10/02/2019. The wound has been in treatment 56 weeks. The wound is located on the Left,Lateral Foot. The wound measures 1.4cm length x 4.7cm width x 0.4cm depth; 5.168cm^2 area and 2.067cm^3 volume. There is Fat Layer (Subcutaneous Tissue) exposed. There is no tunneling noted, however, there is undermining starting at 3:00 and ending at 10:00 with a maximum distance of 1.2cm. There is a large amount of serosanguineous drainage noted. The wound margin is well defined and not attached to the wound base. There is large (67- 100%) red granulation within the wound bed. There is no necrotic tissue within the wound bed. General Notes: macerated periwound Wound #3 status is Open. Original cause of wound was Trauma. The date acquired was: 10/02/2019. The wound has been in treatment 56 weeks. The wound is located on the Left,Lateral Foot. The wound measures 1.4cm length x 4.7cm width x 0.4cm depth; 5.168cm^2 area and 2.067cm^3 volume. There is Fat  Layer (Subcutaneous Tissue) exposed. There is no tunneling noted, however, there is undermining starting at 3:00 and ending at 10:00 with a maximum distance of 1.2cm. There is a large amount of serosanguineous drainage noted. The wound  margin is well defined and not attached to the wound base. There is large (67- 100%) red granulation within the wound bed. There is no necrotic tissue within the wound bed. 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 There was a Excisional Skin/Subcutaneous Tissue Debridement with a total area of 6.58 sq cm performed by [name], MD. With the following instrument(s): Blade, Forceps, and Scissors to remove Viable and Non-Viable tissue/material. Material removed includes Subcutaneous Tissue, Slough, Skin: Dermis, and Skin: Epidermis after achieving pain control using Lidocaine. No specimens were taken. A time out was conducted at 13:20, prior to the start of the procedure. A Moderate amount of bleeding was controlled with Silver Nitrate. The procedure was tolerated well with a pain level of 0 throughout and a pain level of 0 following the procedure. Post Debridement Measurements: 1.4cm length x 4.7cm width x 0.4cm depth; 2.067cm^3 volume. Character of Wound/Ulcer Post Debridement is improved. Severity of Tissue Post Debridement is: Fat layer exposed. Post procedure Diagnosis Wound #3: Same as Pre-Procedure Plan Follow-up Appointments: Return Appointment in 1 week. - with Dr. Dellia Nims Edema Control - Lymphedema / SCD / Other: Elevate legs to the level of the heart or above for 30 minutes daily and/or when sitting, a frequency of: - throughout the day Avoid standing for long periods of time. Exercise regularly Moisturize legs daily. - right leg every night before bed. Compression stocking or Garment 20-30 mm/Hg pressure to: - Apply to right leg  in the morning and remove at night. Off-Loading: Open toe surgical shoe to: - bilateral feet WOUND #3: - Foot Wound Laterality: Left, Lateral Cleanser: Soap and Water Every Other Day/30 Days Discharge Instructions: May shower and wash wound with dial antibacterial soap and water prior to dressing change. Cleanser: Wound Cleanser (DME) (Generic) Every Other Day/30 Days Discharge Instructions: Cleanse the wound with wound cleanser prior to applying a clean dressing using gauze sponges, not tissue or cotton balls. Peri-Wound Care: Zinc Oxide Ointment 30g tube Every Other Day/30 Days Discharge Instructions: Apply Zinc Oxide to periwound with each dressing change as needed. Prim Dressing: KerraCel Ag Gelling Fiber Dressing, 4x5 in (silver alginate) (DME) (Generic) Every Other Day/30 Days ary Discharge Instructions: Apply silver alginate to wound bed as instructed Secondary Dressing: Woven Gauze Sponge, Non-Sterile 4x4 in (DME) (Generic) Every Other Day/30 Days Discharge Instructions: Apply over primary dressing as directed. Secondary Dressing: Zetuvit Plus 4x8 in (DME) (Generic) Every Other Day/30 Days Discharge Instructions: Apply over primary dressing as directed. Secondary Dressing: Optifoam Non-Adhesive Dressing, 4x4 in (DME) (Generic) Every Other Day/30 Days Discharge Instructions: Apply over primary dressing as directed. Secured With: Coban Self-Adherent Wrap 4x5 (in/yd) (DME) (Generic) Every Other Day/30 Days Discharge Instructions: Secure with Coban as directed. Secured With: [employer], 4.5x3.1 (in/yd) (DME) (Generic) Every Other Day/30 Days Discharge Instructions: Secure with Kerlix as directed. Secured With: 69M Medipore H Soft Cloth Surgical T 4 x 2 (in/yd) (DME) (Generic) Every Other Day/30 Days ape Discharge Instructions: Secure  dressing with tape as directed. 1. I changed back from [facility] to silver alginate to help with absorption. Zetuvit, foam and Coban 2 if  there is no improvement next week likely a deep tissue culture for PCR. Electronic Signature(s) Signed: 11/25/2020 8:02:04 AM By: [name] MD Entered By: [name] on 11/24/2020 13:35:56 -------------------------------------------------------------------------------- SuperBill Details Patient Name: Date of Service: [name], [name] 11/24/2020 Medical Record Number: [id number] Patient Account Number: [id number] Date of Birth/Sex: Treating RN: 1985/05/15 (35 y.o. [name], [name] Primary Care Provider: [name] Other Clinician: Referring Provider: Treating Provider/Extender: [name] in Treatment: 56 Diagnosis Coding ICD-10 Codes Code Description E11.621 Type 2 diabetes mellitus with foot ulcer L97.528 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ity L97.518 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ity Facility Procedures CPT4 Code: [id number] Description: 91504 - DEB SUBQ TISSUE 20 SQ CM/< ICD-10 Diagnosis Description L97.528 Non-pressure chronic ulcer of other part of left foot with other specified seve Modifier: rity Quantity: 1 Physician Procedures : CPT4 Code Description Modifier [id number] - WC PHYS SUBQ TISS 20 SQ CM ICD-10 Diagnosis Description L97.528 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ity Quantity: 1 Electronic Signature(s) Signed: 11/24/2020 5:31:05 PM By: Rhae Hammock RN Signed: 11/25/2020 8:02:04 AM By: [name] MD Entered By: Rhae Hammock on 11/24/2020 17:15:42

## 2020-11-25 NOTE — Progress Notes (Signed)
[place], [name] ([id number]) Visit Report for 11/24/2020 Arrival Information Details Patient Name: Date of Service: [name] 11/24/2020 12:30 PM Medical Record Number: [id number] Patient Account Number: [id number] Date of Birth/Sex: Treating RN: October 14, 1985 (35 y.o. [name] Primary Care Letricia Krinsky: [name] Other Clinician: Referring Talani Brazee: Treating Irvan Tiedt/Extender: [name] in Treatment: 56 Visit Information History Since Last Visit Added or deleted any medications: No Patient Arrived: Ambulatory Any new allergies or adverse reactions: No Arrival Time: 12:45 Had a fall or experienced change in No Accompanied By: self activities of daily living that may affect Transfer Assistance: None risk of falls: Patient Identification Verified: Yes Signs or symptoms of abuse/neglect since No Secondary Verification Process Completed: Yes last visito Patient Requires Transmission-Based Precautions: No Hospitalized since last visit: No Patient Has Alerts: No Implantable device outside of the clinic No excluding cellular tissue based products placed in the center since last visit: Has Dressing in Place as Prescribed: Yes Has Footwear/Offloading in Place as Yes Prescribed: Left: Surgical Shoe with Pressure Relief Insole Pain Present Now: No Electronic Signature(s) Signed: 11/24/2020 6:01:23 PM By: Baruch Gouty RN, BSN Entered By: Baruch Gouty on 11/24/2020 12:47:42 -------------------------------------------------------------------------------- Encounter Discharge Information Details Patient Name: Date of Service: [name], [name] 11/24/2020 12:30 PM Medical Record Number: [id number] Patient Account Number: [id number] Date of Birth/Sex: Treating RN: 04/16/1985 (35 y.o. [name] Primary Care Rashika Bettes: [name] Other Clinician: Referring Ardit Danh: Treating Moritz Lever/Extender: [name]  in Treatment: 56 Encounter Discharge Information Items Post Procedure Vitals Discharge Condition: Stable Temperature (F): 99.2 Ambulatory Status: Ambulatory Pulse (bpm): 89 Discharge Destination: Home Respiratory Rate (breaths/min): 18 Transportation: Private Auto Blood Pressure (mmHg): 146/89 Schedule Follow-up Appointment: Yes Clinical Summary of Care: Provided on 11/24/2020 Form Type Recipient Paper Patient Patient Electronic Signature(s) Signed: 11/24/2020 5:20:22 PM By: [name] Entered By: [name] on 11/24/2020 17:20:22 -------------------------------------------------------------------------------- Lower Extremity Assessment Details Patient Name: Date of Service: [name] D 11/24/2020 12:30 PM Medical Record Number: [id number] Patient Account Number: [id number] Date of Birth/Sex: Treating RN: 10/18/1985 (35 y.o. [name] Primary Care Leba Tibbitts: [name] Other Clinician: Referring Kidada Ging: Treating Tamryn Popko/Extender: [name] in Treatment: 56 Edema Assessment Assessed: [name]: No] [name]: No] Edema: [Left: Ye] [Right: s] Calf Left: Right: Point of Measurement: 48 cm From Medial Instep 48.5 cm Ankle Left: Right: Point of Measurement: 11 cm From Medial Instep 28.5 cm Vascular Assessment Pulses: Dorsalis Pedis Palpable: [Left:Yes] Electronic Signature(s) Signed: 11/24/2020 6:01:23 PM By: Baruch Gouty RN, BSN Entered By: Baruch Gouty on 11/24/2020 12:54:22 -------------------------------------------------------------------------------- Multi Wound Chart Details Patient Name: Date of Service: [name], [name] 11/24/2020 12:30 PM Medical Record Number: [id number] Patient Account Number: [id number] Date of Birth/Sex: Treating RN: [birth year] (35 y.o. [name] Primary Care Kameria Canizares: [name] Other Clinician: Referring Trevyon Swor: Treating Hema Lanza/Extender: [name] in Treatment: 56 Vital Signs Height(in): 77 Capillary Blood Glucose(mg/dl): 84 Weight(lbs): 280 Pulse(bpm): 55 Body Mass Index(BMI): 33 Blood Pressure(mmHg): 146/89 Temperature(F): 99.2 Respiratory Rate(breaths/min): 18 Photos: [3:Left, Lateral Foot] [N/A:N/A Left, Lateral Foot N/A] Wound Location: [3:Trauma] [N/A:Trauma N/A] Wounding Event: [3:Diabetic Wound/Ulcer of the Lower] [N/A:Diabetic Wound/Ulcer of the Lower N/A] Primary Etiology: [3:Extremity Type II Diabetes] [N/A:Extremity Type II Diabetes N/A] Comorbid History: [3:10/02/2019] [N/A:10/02/2019 N/A] Date Acquired: [3:56] [N/A:56 N/A] Weeks of Treatment: [3:Open] [N/A:Open N/A] Wound Status: [3:1.4x4.7x0.4] [N/A:1.4x4.7x0.4 N/A] Measurements L x W x D (cm) [3:5.168] [N/A:5.168 N/A] A (cm) : rea [  3:2.067] [N/A:2.067 N/A] Volume (cm) : [3:-213.40%] [N/A:-213.40% N/A] % Reduction in A [3:rea: -1152.70%] [N/A:-1152.70% N/A] % Reduction in Volume: [3:3] [N/A:3] Starting Position 1 (o'clock): [3:10] [N/A:10] Ending Position 1 (o'clock): [3:1.2] [N/A:1.2] Maximum Distance 1 (cm): [3:Yes] [N/A:Yes N/A] Undermining: [3:Grade 2] [N/A:Grade 2 N/A] Classification: [3:Large] [N/A:Large N/A] Exudate A mount: [3:Serosanguineous] [N/A:Serosanguineous N/A] Exudate Type: [3:red, brown] [N/A:red, brown N/A] Exudate Color: [3:Well defined, not attached] [N/A:Well defined, not attached N/A] Wound Margin: [3:Large (67-100%)] [N/A:Large (67-100%) N/A] Granulation A mount: [3:Red] [N/A:Red N/A] Granulation Quality: [3:None Present (0%)] [N/A:None Present (0%) N/A] Necrotic A mount: [3:Fat Layer (Subcutaneous Tissue): Yes Fat Layer (Subcutaneous Tissue): Yes N/A] Exposed Structures: [3:Fascia: No Tendon: No Muscle: No Joint: No Bone: No Small (1-33%)] [N/A:Fascia: No Tendon: No Muscle: No Joint: No Bone: No Small (1-33%) N/A] Epithelialization: [3:Debridement - Excisional] [N/A:Debridement - Excisional  N/A] Debridement: Pre-procedure Verification/Time Out 13:20 [N/A:13:20 N/A] Taken: [3:Lidocaine] [N/A:Lidocaine N/A] Pain Control: [3:Subcutaneous, Slough] [N/A:Subcutaneous, Slough N/A] Tissue Debrided: [3:Skin/Subcutaneous Tissue] [N/A:Skin/Subcutaneous Tissue N/A] Level: [3:6.58] [N/A:6.58 N/A] Debridement A (sq cm): [3:rea Blade, Forceps, Scissors] [N/A:Blade, Forceps, Scissors N/A] Instrument: [3:Moderate] [N/A:Moderate N/A] Bleeding: [3:Silver Nitrate] [N/A:Silver Nitrate N/A] Hemostasis A chieved: [3:0] [N/A:0 N/A] Procedural Pain: [3:0] [N/A:0 N/A] Post Procedural Pain: [3:Procedure was tolerated well] [N/A:Procedure was tolerated well N/A] Debridement Treatment Response: [3:1.4x4.7x0.4] [N/A:1.4x4.7x0.4 N/A] Post Debridement Measurements L x W x D (cm) [3:2.067] [N/A:2.067 N/A] Post Debridement Volume: (cm) [3:macerated periwound] [N/A:macerated periwound N/A] Assessment Notes: [3:Debridement] [N/A:Debridement N/A] Treatment Notes Electronic Signature(s) Signed: 11/24/2020 5:31:05 PM By: Rhae Hammock RN Signed: 11/25/2020 8:02:04 AM By: [name] MD Entered By: [name] on 11/24/2020 13:32:06 -------------------------------------------------------------------------------- Multi-Disciplinary Care Plan Details Patient Name: Date of Service: [name], [name] 11/24/2020 12:30 PM Medical Record Number: [id number] Patient Account Number: [id number] Date of Birth/Sex: Treating RN: [birth year] (35 y.o. [name], [name] Primary Care Dayanne Yiu: [name] Other Clinician: Referring Haylen Shelnutt: Treating Maison Kestenbaum/Extender: [name] in Treatment: 56 Multidisciplinary Care Plan reviewed with physician Active Inactive Nutrition Nursing Diagnoses: Imbalanced nutrition Potential for alteratiion in Nutrition/Potential for imbalanced nutrition Goals: Patient/caregiver agrees to and verbalizes understanding of need to use nutritional  supplements and/or vitamins as prescribed Date Initiated: 10/24/2019 Date Inactivated: 04/06/2020 Target Resolution Date: 04/03/2020 Goal Status: Met Patient/caregiver will maintain therapeutic glucose control Date Initiated: 10/24/2019 Target Resolution Date: 12/22/2020 Goal Status: Active Interventions: Assess HgA1c results as ordered upon admission and as needed Assess patient nutrition upon admission and as needed per policy Provide education on elevated blood sugars and impact on wound healing Provide education on nutrition Treatment Activities: Education provided on Nutrition : 11/24/2020 Notes: 11/17/20: Glucose control ongoing issue, target date extended. Wound/Skin Impairment Nursing Diagnoses: Impaired tissue integrity Knowledge deficit related to ulceration/compromised skin integrity Goals: Patient/caregiver will verbalize understanding of skin care regimen Date Initiated: 10/24/2019 Target Resolution Date: 12/21/2020 Goal Status: Active Ulcer/skin breakdown will have a volume reduction of 30% by week 4 Date Initiated: 10/24/2019 Date Inactivated: 01/16/2020 Target Resolution Date: 01/10/2020 Unmet Reason: no change in Goal Status: Unmet measurements. Interventions: Assess patient/caregiver ability to obtain necessary supplies Assess patient/caregiver ability to perform ulcer/skin care regimen upon admission and as needed Assess ulceration(s) every visit Provide education on ulcer and skin care Notes: 11/17/20: Wound care regimen continues Electronic Signature(s) Signed: 11/24/2020 5:31:05 PM By: Rhae Hammock RN Entered By: Rhae Hammock on 11/24/2020 13:26:19 -------------------------------------------------------------------------------- Pain Assessment Details Patient Name: Date of Service: [name], [name] 11/24/2020 12:30 PM Medical Record Number: [id number] Patient Account  Number: [id number] Date of Birth/Sex: Treating RN: [birth year] (35 y.o. [name] Primary Care Estalene Bergey: [name] Other Clinician: Referring Danelle Curiale: Treating Brenley Priore/Extender: [name] in Treatment: 56 Active Problems Location of Pain Severity and Description of Pain Patient Has Paino No Site Locations Rate the pain. Current Pain Level: 0 Pain Management and Medication Current Pain Management: Electronic Signature(s) Signed: 11/24/2020 6:01:23 PM By: Baruch Gouty RN, BSN Entered By: Baruch Gouty on 11/24/2020 12:50:43 -------------------------------------------------------------------------------- Patient/Caregiver Education Details Patient Name: Date of Service: [name] D 8/23/2022andnbsp12:30 PM Medical Record Number: [id number] Patient Account Number: [id number] Date of Birth/Gender: Treating RN: [birth year] (35 y.o. [name] Primary Care Physician: [name] Other Clinician: Referring Physician: Treating Physician/Extender: [name] in Treatment: 57 Education Assessment Education Provided To: Patient Education Topics Provided Elevated Blood Sugar/ Impact on Healing: Methods: Explain/Verbal Responses: State content correctly Nutrition: Methods: Explain/Verbal Responses: State content correctly Wound/Skin Impairment: Methods: Explain/Verbal Responses: State content correctly Electronic Signature(s) Signed: 11/24/2020 5:31:05 PM By: Rhae Hammock RN Entered By: Rhae Hammock on 11/24/2020 13:22:27 -------------------------------------------------------------------------------- Wound Assessment Details Patient Name: Date of Service: [name], [name] 11/24/2020 12:30 PM Medical Record Number: [id number] Patient Account Number: [id number] Date of Birth/Sex: Treating RN: 1985-12-27 (35 y.o. [name] Primary Care Kylen Ismael: [name] Other Clinician: Referring Shianna Bally: Treating Chaise Mahabir/Extender: [name] in Treatment: 56 Wound Status Wound Number: 3 Primary Etiology: Diabetic Wound/Ulcer of the Lower Extremity Wound Location: Left, Lateral Foot Wound Status: Open Wounding Event: Trauma Comorbid History: Type II Diabetes Date Acquired: 10/02/2019 Weeks Of Treatment: 56 Clustered Wound: No Photos Wound Measurements Length: (cm) 1.4 Width: (cm) 4.7 Depth: (cm) 0.4 Area: (cm) 5.168 Volume: (cm) 2.067 % Reduction in Area: -213.4% % Reduction in Volume: -1152.7% Epithelialization: Small (1-33%) Tunneling: No Undermining: Yes Starting Position (o'clock): 3 Ending Position (o'clock): 10 Maximum Distance: (cm) 1.2 Wound Description Classification: Grade 2 Wound Margin: Well defined, not attached Exudate Amount: Large Exudate Type: Serosanguineous Exudate Color: red, brown Foul Odor After Cleansing: No Slough/Fibrino No Wound Bed Granulation Amount: Large (67-100%) Exposed Structure Granulation Quality: Red Fascia Exposed: No Necrotic Amount: None Present (0%) Fat Layer (Subcutaneous Tissue) Exposed: Yes Tendon Exposed: No Muscle Exposed: No Joint Exposed: No Bone Exposed: No Assessment Notes macerated periwound Electronic Signature(s) Signed: 11/24/2020 6:01:23 PM By: Baruch Gouty RN, BSN Entered By: Baruch Gouty on 11/24/2020 13:04:43 -------------------------------------------------------------------------------- Wound Assessment Details Patient Name: Date of Service: [name], [name] 11/24/2020 12:30 PM Medical Record Number: [id number] Patient Account Number: [id number] Date of Birth/Sex: Treating RN: Jan 20, 1986 (35 y.o. [name] Primary Care [name]: [name] Other Clinician: Referring Bayley Hurn: Treating [name]/Extender: [name] in Treatment: 56 Wound Status Wound Number: 3 Primary Etiology: Diabetic Wound/Ulcer of the Lower Extremity Wound Location: Left, Lateral  Foot Wound Status: Open Wounding Event: Trauma Comorbid History: Type II Diabetes Date Acquired: 10/02/2019 Weeks Of Treatment: 56 Clustered Wound: No Photos Wound Measurements Length: (cm) 1.4 Width: (cm) 4.7 Depth: (cm) 0.4 Area: (cm) 5.168 Volume: (cm) 2.067 % Reduction in Area: -213.4% % Reduction in Volume: -1152.7% Epithelialization: Small (1-33%) Tunneling: No Undermining: Yes Starting Position (o'clock): 3 Ending Position (o'clock): 10 Maximum Distance: (cm) 1.2 Wound Description Classification: Grade 2 Wound Margin: Well defined, not attached Exudate Amount: Large Exudate Type: Serosanguineous Exudate Color: red, brown Foul Odor After Cleansing: No Slough/Fibrino No Wound Bed Granulation Amount: Large (67-100%) Exposed Structure Granulation Quality: Red Fascia Exposed:  No Necrotic Amount: None Present (0%) Fat Layer (Subcutaneous Tissue) Exposed: Yes Tendon Exposed: No Muscle Exposed: No Joint Exposed: No Bone Exposed: No Assessment Notes macerated periwound Treatment Notes Wound #3 (Foot) Wound Laterality: Left, Lateral Cleanser Soap and Water Discharge Instruction: May shower and wash wound with dial antibacterial soap and water prior to dressing change. Wound Cleanser Discharge Instruction: Cleanse the wound with wound cleanser prior to applying a clean dressing using gauze sponges, not tissue or cotton balls. Peri-Wound Care Zinc Oxide Ointment 30g tube Discharge Instruction: Apply Zinc Oxide to periwound with each dressing change as needed. Topical Primary Dressing KerraCel Ag Gelling Fiber Dressing, 4x5 in (silver alginate) Discharge Instruction: Apply silver alginate to wound bed as instructed Secondary Dressing Woven Gauze Sponge, Non-Sterile 4x4 in Discharge Instruction: Apply over primary dressing as directed. Zetuvit Plus 4x8 in Discharge Instruction: Apply over primary dressing as directed. Optifoam Non-Adhesive Dressing, 4x4  in Discharge Instruction: Apply over primary dressing as directed. Secured With [employer] 4x5 (in/yd) Discharge Instruction: Secure with Coban as directed. Kerlix Roll Sterile, 4.5x3.1 (in/yd) Discharge Instruction: Secure with Kerlix as directed. 26M Medipore H Soft Cloth Surgical T 4 x 2 (in/yd) ape Discharge Instruction: Secure dressing with tape as directed. Compression Wrap Compression Stockings Add-Ons Electronic Signature(s) Signed: 11/24/2020 6:01:23 PM By: Baruch Gouty RN, BSN Entered By: Baruch Gouty on 11/24/2020 72:89:79 -------------------------------------------------------------------------------- Vitals Details Patient Name: Date of Service: [name], [name] 11/24/2020 12:30 PM Medical Record Number: [id number] Patient Account Number: [id number] Date of Birth/Sex: Treating RN: 1986-01-06 (35 y.o. [name] Primary Care Micky Overturf: [name] Other Clinician: Referring Nikiyah Fackler: Treating Hisae Decoursey/Extender: [name] in Treatment: 56 Vital Signs Time Taken: 12:49 Temperature (F): 99.2 Height (in): 77 Pulse (bpm): 89 Source: Stated Respiratory Rate (breaths/min): 18 Weight (lbs): 280 Blood Pressure (mmHg): 146/89 Source: Stated Capillary Blood Glucose (mg/dl): 84 Body Mass Index (BMI): 33.2 Reference Range: 80 - 120 mg / dl Notes glucose per pt report this am Electronic Signature(s) Signed: 11/24/2020 6:01:23 PM By: Baruch Gouty RN, BSN Entered By: Baruch Gouty on 11/24/2020 12:50:29

## 2020-12-02 ENCOUNTER — Encounter (HOSPITAL_BASED_OUTPATIENT_CLINIC_OR_DEPARTMENT_OTHER): Payer: BC Managed Care – PPO | Admitting: Internal Medicine

## 2020-12-02 ENCOUNTER — Other Ambulatory Visit: Payer: Self-pay

## 2020-12-02 DIAGNOSIS — E11621 Type 2 diabetes mellitus with foot ulcer: Secondary | ICD-10-CM | POA: Diagnosis not present

## 2020-12-03 NOTE — Progress Notes (Signed)
[place], [name] ([id number]) Visit Report for 12/02/2020 Arrival Information Details Patient Name: Date of Service: [name] 12/02/2020 7:30 A M Medical Record Number: [id number] Patient Account Number: [id number] Date of Birth/Sex: Treating RN: January 18, 1986 (35 y.o. [name], [name] Primary Care Ladarian Bonczek: [name] Other Clinician: Referring Mohamedamin Nifong: Treating Shean Gerding/Extender: [name] in Treatment: 23 Visit Information History Since Last Visit Added or deleted any medications: No Patient Arrived: Ambulatory Any new allergies or adverse reactions: No Arrival Time: 07:43 Had a fall or experienced change in No Accompanied By: self activities of daily living that may affect Transfer Assistance: None risk of falls: Patient Identification Verified: Yes Signs or symptoms of abuse/neglect since last visito No Secondary Verification Process Completed: Yes Hospitalized since last visit: No Patient Requires Transmission-Based Precautions: No Implantable device outside of the clinic excluding No Patient Has Alerts: No cellular tissue based products placed in the center since last visit: Has Dressing in Place as Prescribed: Yes Pain Present Now: No Electronic Signature(s) Signed: 12/03/2020 4:23:50 PM By: Rhae Hammock RN Entered By: Rhae Hammock on 12/02/2020 07:44:06 -------------------------------------------------------------------------------- Clinic Level of Care Assessment Details Patient Name: Date of Service: [name] 12/02/2020 7:30 A M Medical Record Number: [id number] Patient Account Number: [id number] Date of Birth/Sex: Treating RN: [birth year] (35 y.o. [name], [name] Primary Care Brecklynn Jian: [name] Other Clinician: Referring Twila Rappa: Treating Markayla Reichart/Extender: [name] in Treatment: 57 Clinic Level of Care Assessment Items TOOL 4 Quantity Score X- 1 0 Use when only  an EandM is performed on FOLLOW-UP visit ASSESSMENTS - Nursing Assessment / Reassessment X- 1 10 Reassessment of Co-morbidities (includes updates in patient status) X- 1 5 Reassessment of Adherence to Treatment Plan ASSESSMENTS - Wound and Skin A ssessment / Reassessment X - Simple Wound Assessment / Reassessment - one wound 1 5 []  - 0 Complex Wound Assessment / Reassessment - multiple wounds []  - 0 Dermatologic / Skin Assessment (not related to wound area) ASSESSMENTS - Focused Assessment X- 1 5 Circumferential Edema Measurements - multi extremities []  - 0 Nutritional Assessment / Counseling / Intervention []  - 0 Lower Extremity Assessment (monofilament, tuning fork, pulses) []  - 0 Peripheral Arterial Disease Assessment (using hand held doppler) ASSESSMENTS - Ostomy and/or Continence Assessment and Care []  - 0 Incontinence Assessment and Management []  - 0 Ostomy Care Assessment and Management (repouching, etc.) PROCESS - Coordination of Care X - Simple Patient / Family Education for ongoing care 1 15 []  - 0 Complex (extensive) Patient / Family Education for ongoing care X- 1 10 Staff obtains Programmer, systems, Records, T Results / Process Orders est []  - 0 Staff telephones HHA, Nursing Homes / Clarify orders / etc []  - 0 Routine Transfer to another Facility (non-emergent condition) []  - 0 Routine Hospital Admission (non-emergent condition) []  - 0 New Admissions / Biomedical engineer / Ordering NPWT Apligraf, etc. , []  - 0 Emergency Hospital Admission (emergent condition) X- 1 10 Simple Discharge Coordination []  - 0 Complex (extensive) Discharge Coordination PROCESS - Special Needs []  - 0 Pediatric / Minor Patient Management []  - 0 Isolation Patient Management []  - 0 Hearing / Language / Visual special needs []  - 0 Assessment of Community assistance (transportation, [name]/C planning, etc.) []  - 0 Additional assistance / Altered mentation []  - 0 Support Surface(s)  Assessment (bed, cushion, seat, etc.) INTERVENTIONS - Wound Cleansing / Measurement X - Simple Wound Cleansing - one wound 1 5 []  - 0 Complex Wound Cleansing -  multiple wounds X- 1 5 Wound Imaging (photographs - any number of wounds) []  - 0 Wound Tracing (instead of photographs) X- 1 5 Simple Wound Measurement - one wound []  - 0 Complex Wound Measurement - multiple wounds INTERVENTIONS - Wound Dressings X - Small Wound Dressing one or multiple wounds 1 10 []  - 0 Medium Wound Dressing one or multiple wounds []  - 0 Large Wound Dressing one or multiple wounds X- 1 5 Application of Medications - topical []  - 0 Application of Medications - injection INTERVENTIONS - Miscellaneous []  - 0 External ear exam []  - 0 Specimen Collection (cultures, biopsies, blood, body fluids, etc.) []  - 0 Specimen(s) / Culture(s) sent or taken to Lab for analysis []  - 0 Patient Transfer (multiple staff / Civil Service fast streamer / Similar devices) []  - 0 Simple Staple / Suture removal (25 or less) []  - 0 Complex Staple / Suture removal (26 or more) []  - 0 Hypo / Hyperglycemic Management (close monitor of Blood Glucose) []  - 0 Ankle / Brachial Index (ABI) - do not check if billed separately X- 1 5 Vital Signs Has the patient been seen at the hospital within the last three years: Yes Total Score: 95 Level Of Care: New/Established - Level 3 Electronic Signature(s) Signed: 12/03/2020 4:23:50 PM By: Rhae Hammock RN Entered By: Rhae Hammock on 12/02/2020 11:58:17 -------------------------------------------------------------------------------- Encounter Discharge Information Details Patient Name: Date of Service: [name], [name] 12/02/2020 7:30 A M Medical Record Number: [id number] Patient Account Number: [id number] Date of Birth/Sex: Treating RN: [birth year] (35 y.o. [name], [name] Primary Care Kimika Streater: [name] Other Clinician: Referring Jailen Lung: Treating Alexas Basulto/Extender: [name] in Treatment: 13 Encounter Discharge Information Items Discharge Condition: Stable Ambulatory Status: Ambulatory Discharge Destination: Home Transportation: Private Auto Accompanied By: self Schedule Follow-up Appointment: Yes Clinical Summary of Care: Patient Declined Electronic Signature(s) Signed: 12/03/2020 4:23:50 PM By: Rhae Hammock RN Entered By: Rhae Hammock on 12/02/2020 11:59:52 -------------------------------------------------------------------------------- Lower Extremity Assessment Details Patient Name: Date of Service: [name], [name] 12/02/2020 7:30 A M Medical Record Number: [id number] Patient Account Number: [id number] Date of Birth/Sex: Treating RN: [birth year] (35 y.o. [name], [name] Primary Care Beauregard Jarrells: [name] Other Clinician: Referring Nghia Mcentee: Treating Jaan Fischel/Extender: [name] in Treatment: 57 Edema Assessment Assessed: [name]: Yes] [name]: No] Edema: [Left: Ye] [Right: s] Calf Left: Right: Point of Measurement: 48 cm From Medial Instep 48.5 cm Ankle Left: Right: Point of Measurement: 11 cm From Medial Instep 28.5 cm Vascular Assessment Pulses: Dorsalis Pedis Palpable: [Left:Yes] Posterior Tibial Palpable: [Left:Yes] Electronic Signature(s) Signed: 12/03/2020 4:23:50 PM By: Rhae Hammock RN Entered By: Rhae Hammock on 12/02/2020 07:57:14 -------------------------------------------------------------------------------- Multi Wound Chart Details Patient Name: Date of Service: [name], [name] 12/02/2020 7:30 A M Medical Record Number: [id number] Patient Account Number: [id number] Date of Birth/Sex: Treating RN: 1985/11/11 (35 y.o. [name] Primary Care Maite Burlison: [name] Other Clinician: Referring Jamerius Boeckman: Treating Tameyah Koch/Extender: [name] in Treatment: 57 Vital Signs Height(in): 77 Capillary Blood  Glucose(mg/dl): 130 Weight(lbs): 280 Pulse(bpm): 93 Body Mass Index(BMI): 33 Blood Pressure(mmHg): 147/95 Temperature(F): 98.7 Respiratory Rate(breaths/min): 17 Photos: [N/A:N/A] Left, Lateral Foot N/A N/A Wound Location: Trauma N/A N/A Wounding Event: Diabetic Wound/Ulcer of the Lower N/A N/A Primary Etiology: Extremity Type II Diabetes N/A N/A Comorbid History: 10/02/2019 N/A N/A Date Acquired: 68 N/A N/A Weeks of Treatment: Open N/A N/A Wound Status: 2.2x3x0.3 N/A N/A Measurements L x W x [name] 5.184 N/A N/A  A (cm) : rea 1.555 N/A N/A Volume (cm) : -214.40% N/A N/A % Reduction in A rea: -842.40% N/A N/A % Reduction in Volume: 7 Starting Position 1 (o'clock): 11 Ending Position 1 (o'clock): 0.6 Maximum Distance 1 (cm): Yes N/A N/A Undermining: Grade 2 N/A N/A Classification: Large N/A N/A Exudate A mount: Serosanguineous N/A N/A Exudate Type: red, brown N/A N/A Exudate Color: Well defined, not attached N/A N/A Wound Margin: Large (67-100%) N/A N/A Granulation A mount: Red N/A N/A Granulation Quality: None Present (0%) N/A N/A Necrotic A mount: Fat Layer (Subcutaneous Tissue): Yes N/A N/A Exposed Structures: Fascia: No Tendon: No Muscle: No Joint: No Bone: No Small (1-33%) N/A N/A Epithelialization: Treatment Notes Electronic Signature(s) Signed: 12/02/2020 4:28:25 PM By: [name] MD Signed: 12/03/2020 5:19:32 PM By: [name] RN, BSN Entered By: [name] on 12/02/2020 08:17:07 -------------------------------------------------------------------------------- Multi-Disciplinary Care Plan Details Patient Name: Date of Service: [name], [name] 12/02/2020 7:30 A M Medical Record Number: [id number] Patient Account Number: [id number] Date of Birth/Sex: Treating RN: Jul 10, 1985 (35 y.o. [name], [name] Primary Care Xaine Sansom: [name] Other Clinician: Referring Junia Nygren: Treating Tala Eber/Extender: [name] in Treatment: 9 Multidisciplinary Care Plan reviewed with physician Active Inactive Nutrition Nursing Diagnoses: Imbalanced nutrition Potential for alteratiion in Nutrition/Potential for imbalanced nutrition Goals: Patient/caregiver agrees to and verbalizes understanding of need to use nutritional supplements and/or vitamins as prescribed Date Initiated: 10/24/2019 Date Inactivated: 04/06/2020 Target Resolution Date: 04/03/2020 Goal Status: Met Patient/caregiver will maintain therapeutic glucose control Date Initiated: 10/24/2019 Target Resolution Date: 12/25/2020 Goal Status: Active Interventions: Assess HgA1c results as ordered upon admission and as needed Assess patient nutrition upon admission and as needed per policy Provide education on elevated blood sugars and impact on wound healing Provide education on nutrition Treatment Activities: Education provided on Nutrition : 10/13/2020 Notes: 11/17/20: Glucose control ongoing issue, target date extended. Wound/Skin Impairment Nursing Diagnoses: Impaired tissue integrity Knowledge deficit related to ulceration/compromised skin integrity Goals: Patient/caregiver will verbalize understanding of skin care regimen Date Initiated: 10/24/2019 Target Resolution Date: 12/21/2020 Goal Status: Active Ulcer/skin breakdown will have a volume reduction of 30% by week 4 Date Initiated: 10/24/2019 Date Inactivated: 01/16/2020 Target Resolution Date: 01/10/2020 Unmet Reason: no change in Goal Status: Unmet measurements. Interventions: Assess patient/caregiver ability to obtain necessary supplies Assess patient/caregiver ability to perform ulcer/skin care regimen upon admission and as needed Assess ulceration(s) every visit Provide education on ulcer and skin care Notes: 11/17/20: Wound care regimen continues Electronic Signature(s) Signed: 12/03/2020 4:23:50 PM By: Rhae Hammock RN Entered By: Rhae Hammock on 12/02/2020 08:10:10 -------------------------------------------------------------------------------- Pain Assessment Details Patient Name: Date of Service: [name], [name] 12/02/2020 7:30 A M Medical Record Number: [id number] Patient Account Number: [id number] Date of Birth/Sex: Treating RN: 09-Dec-1985 (35 y.o. [name] Primary Care Marysol Wellnitz: [name] Other Clinician: Referring Berkley Wrightsman: Treating Dolphus Linch/Extender: [name] in Treatment: 81 Active Problems Location of Pain Severity and Description of Pain Patient Has Paino No Site Locations Pain Management and Medication Current Pain Management: Electronic Signature(s) Signed: 12/03/2020 4:23:50 PM By: Rhae Hammock RN Entered By: Rhae Hammock on 12/02/2020 07:44:41 -------------------------------------------------------------------------------- Patient/Caregiver Education Details Patient Name: Date of Service: [name], [name] 8/31/2022andnbsp7:30 A M Medical Record Number: [id number] Patient Account Number: [id number] Date of Birth/Gender: Treating RN: Jun 15, 1985 (35 y.o. [name] Primary Care Physician: [name] Other Clinician: Referring Physician: Treating Physician/Extender: [name] in Treatment: 57 Education Assessment Education Provided To:  Patient Education Topics Provided Elevated Blood Sugar/ Impact on Healing: Methods: Explain/Verbal Responses: State content correctly Nutrition: Methods: Explain/Verbal Responses: State content correctly Wound/Skin Impairment: Methods: Explain/Verbal Responses: State content correctly Electronic Signature(s) Signed: 12/03/2020 4:23:50 PM By: Rhae Hammock RN Entered By: Rhae Hammock on 12/02/2020 08:10:33 -------------------------------------------------------------------------------- Wound Assessment Details Patient Name: Date of Service: [name], [name] 12/02/2020 7:30 A M Medical Record Number: [id number] Patient Account Number: [id number] Date of Birth/Sex: Treating RN: Apr 10, 1985 (35 y.o. [name], [place] Primary Care Brittney Caraway: [name] Other Clinician: Referring Basilio Meadow: Treating Arlando Leisinger/Extender: [name] in Treatment: 57 Wound Status Wound Number: 3 Primary Etiology: Diabetic Wound/Ulcer of the Lower Extremity Wound Location: Left, Lateral Foot Wound Status: Open Wounding Event: Trauma Comorbid History: Type II Diabetes Date Acquired: 10/02/2019 Weeks Of Treatment: 57 Clustered Wound: No Photos Wound Measurements Length: (cm) 2.2 Width: (cm) 3 Depth: (cm) 0.3 Area: (cm) 5.184 Volume: (cm) 1.555 Wound Description Classification: Grade 2 Wound Margin: Well defined, not attached Exudate Amount: Large Exudate Type: Serosanguineous Exudate Color: red, brown Foul Odor After Cleansing: Slough/Fibrino % Reduction in Area: -214.4% % Reduction in Volume: -842.4% Epithelialization: Small (1-33%) Tunneling: No Undermining: Yes Starting Position (o'clock): 7 Ending Position (o'clock): 11 Maximum Distance: (cm) 0.6 No No Wound Bed Granulation Amount: Large (67-100%) Exposed Structure Granulation Quality: Red Fascia Exposed: No Necrotic Amount: None Present (0%) Fat Layer (Subcutaneous Tissue) Exposed: Yes Tendon Exposed: No Muscle Exposed: No Joint Exposed: No Bone Exposed: No Treatment Notes Wound #3 (Foot) Wound Laterality: Left, Lateral Cleanser Soap and Water Discharge Instruction: May shower and wash wound with dial antibacterial soap and water prior to dressing change. Wound Cleanser Discharge Instruction: Cleanse the wound with wound cleanser prior to applying a clean dressing using gauze sponges, not tissue or cotton balls. Peri-Wound Care Zinc Oxide Ointment 30g tube Discharge Instruction: Apply Zinc Oxide to periwound with each dressing change as  needed. Topical Primary Dressing KerraCel Ag Gelling Fiber Dressing, 4x5 in (silver alginate) Discharge Instruction: Apply silver alginate to wound bed as instructed Secondary Dressing Woven Gauze Sponge, Non-Sterile 4x4 in Discharge Instruction: Apply over primary dressing as directed. Zetuvit Plus 4x8 in Discharge Instruction: Apply over primary dressing as directed. Optifoam Non-Adhesive Dressing, 4x4 in Discharge Instruction: Apply over primary dressing as directed. Secured With [employer] 4x5 (in/yd) Discharge Instruction: Secure with Coban as directed. Kerlix Roll Sterile, 4.5x3.1 (in/yd) Discharge Instruction: Secure with Kerlix as directed. 25M Medipore H Soft Cloth Surgical T 4 x 2 (in/yd) ape Discharge Instruction: Secure dressing with tape as directed. Compression Wrap Compression Stockings Add-Ons Electronic Signature(s) Signed: 12/03/2020 4:23:50 PM By: Rhae Hammock RN Entered By: Rhae Hammock on 12/02/2020 08:00:40 -------------------------------------------------------------------------------- Vitals Details Patient Name: Date of Service: [name], [name] 12/02/2020 7:30 A M Medical Record Number: [id number] Patient Account Number: [id number] Date of Birth/Sex: Treating RN: January 06, 1986 (35 y.o. [name], [name] Primary Care Izea Livolsi: [name] Other Clinician: Referring Daxter Paule: Treating Queena Monrreal/Extender: [name] in Treatment: 57 Vital Signs Time Taken: 07:44 Temperature (F): 98.7 Height (in): 77 Pulse (bpm): 93 Weight (lbs): 280 Respiratory Rate (breaths/min): 17 Body Mass Index (BMI): 33.2 Blood Pressure (mmHg): 147/95 Capillary Blood Glucose (mg/dl): 130 Reference Range: 80 - 120 mg / dl Electronic Signature(s) Signed: 12/03/2020 4:23:50 PM By: Rhae Hammock RN Entered By: Rhae Hammock on 12/02/2020 07:44:31

## 2020-12-03 NOTE — Progress Notes (Signed)
Orzechowski, [name] ([id number]) Visit Report for 12/02/2020 HPI Details Patient Name: Date of Service: [name] 12/02/2020 7:30 A M Medical Record Number: [id number] Patient Account Number: [id number] Date of Birth/Sex: Treating RN: 1985-04-26 (35 y.o. 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 He comes in for review of a wound over the plantar fifth metatarsal head extending into the lateral part of the foot. He was followed for this previously by his podiatrist Dr. Cornelius Moras. As the patient tells his story he went to see podiatry first for a swelling he developed on the lateral part of his fifth metatarsal head in May. He states this was "open" by podiatry and the area closed. He was followed up in June and it was again opened callus removed and it closed promptly. There were plans being made for surgery on the fifth metatarsal head in June however his blood sugar was apparently too high for anesthesia. Apparently the area was debrided and opened again in June and it is never closed since. Looking over the records from podiatry I am really not able to follow this. It was clear when he was first seen it was before 5/14 at that point he already had a wound. By 5/17 the ulcer was resolved. I do not see anything about a procedure. On 5/28 noted to have pre-ulcerative moderate keratosis. X-ray noted 1/5 contracted toe and tailor's bunion and metatarsal deformity. On a visit date on 09/28/2018 the dorsal part of the left foot it healed and resolved. There was concern about swelling in his lower extremity he was sent to the ER.. As far as I can tell he was seen in the ER on 7/12 with an ulcer on his left foot. A DVT rule  out of the left leg was negative. I do not think I have complete records from podiatry but I am not able to verify the procedures this patient states he had. He states after the last procedure the wound has never closed although I am not able to follow this in the records I have from podiatry. He has not had a recent x-ray The patient has been using Neosporin on the wound. He is wearing a Darco shoe. He is still very active up on his foot working and exercising. Past medical history; type 2 diabetes ketosis-prone, leg swelling with a negative DVT study in July. Non-smoker ABI in our clinic was 0.85 on the left 10/16; substantial wound on the plantar left fifth met head extending laterally almost to the dorsal fifth MTP. We have been using silver alginate we gave him a Darco forefoot off loader. An x-ray did not show evidence of osteomyelitis did note soft tissue emphysema which I think was due to gas tracking through an open wound. There is no doubt in my mind he requires an MRI 10/23; MRI not booked until 3 November at the earliest this is largely due to his glucose sensor in the right arm. We have been using silver alginate. There has been an improvement 10/29; I am still not exactly sure when his MRI is booked for. He says it is the third but it is the 10th in epic. This definitely needs to be done. He is running a low-grade fever today but no other symptoms. No real  improvement in the 1 02/26/2019 patient presents today for a follow-up visit here in our clinic he is last been seen in the clinic on October 29. Subsequently we were working on getting MRI to evaluate and see what exactly was going on and where we would need to go from the standpoint of whether or not he had osteomyelitis and again what treatments were going be required. Subsequently the patient ended up being admitted to the hospital on 02/07/2019 and was discharged on 02/14/2019. This is a somewhat interesting admission with a  discharge diagnosis of pneumonia due to COVID-19 although he was positive for COVID-19 when tested at the urgent care but negative x2 when he was actually in the hospital. With that being said he did have acute respiratory failure with hypoxia and it was noted he also have a left foot ulceration with osteomyelitis. With that being said he did require oxygen for his pneumonia and I level 4 L. He was placed on antivirals and steroids for the COVID-19. He was also transferred to the [facility] at one point. Nonetheless he did subsequently discharged home and since being home has done much better in that regard. The CT angiogram did not show any pulmonary embolism. With regard to the osteomyelitis the patient was placed on vancomycin and Zosyn while in the hospital but has been changed to Augmentin at discharge. It was also recommended that he follow- up with wound care and podiatry. Podiatry however wanted him to see [name] according to the patient prior to them doing anything further. His hemoglobin A1c was 9.9 as noted in the hospital. Have an MRI of the left foot performed while in the hospital on 02/04/2019. 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 There is an overlying plantar open wound noted an abscess tracking back along the lateral aspect of the fifth metatarsal shaft. There is otherwise diffuse cellulitis and mild fasciitis without findings of polymyositis. The patient did have recently pneumonia secondary to COVID-19 I looked in the chart through epic and it does appear that the patient may need to have an additional x-ray just to ensure everything is cleared and that he has no airspace disease prior to putting him into the chamber. 03/05/2019; patient was readmitted to the clinic last week. He was hospitalized twice for a viral upper respiratory tract infection from 11/1 through 11/4 and  then 11/5 through 11/12 ultimately this turned out to be Covid pneumonitis. Although he was discharged on oxygen he is not using it. He says he feels fine. He has no exercise limitation no cough no sputum. His O2 sat in our clinic today was 100% on room air. 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 He received Vanco and Zosyn in the hospital and then was discharged on 2 weeks of Augmentin. I do not see any relevant cultures. He was supposed to follow-up with infectious disease but I do not see that he has an appointment. 12/8; patient saw Dr. Novella Olive of infectious disease last week. He felt that he had had adequate antibiotic therapy. He did not go to follow-up with Dr. Amalia Hailey of podiatry and I have again talked to him about the pros and cons of this. He does not want to consider a ray amputation of this time. He is aware of the risks  of recurrence, migration etc. He started HBO today and tolerated this well. He can complete the Augmentin that I gave him last week. I have looked over the lab work that Dr. Chana Bode ordered his C-reactive protein was 3.3 and his sedimentation rate was 17. The C-reactive protein is never really been measurably that high in this patient 12/15; not much change in the wound today however he has undermining along the lateral part of the foot again more extensively than last week. He has some rims of epithelialization. We have been using silver alginate. He is undergoing hyperbarics but did not dive today 12/18; in for his obligatory first total contact cast change. Unfortunately there was pus coming from the undermining area around his fifth metatarsal head. This was cultured but will preclude reapplication of a cast. He is seen in conjunction with HBO 12/24; patient had staph lugdunensis in the wound in the undermining area laterally last time. We put him on  doxycycline which should have covered this. The wound looks better today. I am going to give him another week of doxycycline before reattempting the total contact cast 12/31; the patient is completing antibiotics. Hemorrhagic debris in the distal part of the wound with some undermining distally. He also had hyper granulation. Extensive debridement with a #5 curette. The infected area that was on the lateral part of the fifth met head is closed over. I do not think he needs any more antibiotics. Patient was seen prior to HBO. Preparations for a total contact cast were made in the cast will be placed post hyperbarics 04/11/19; once again the patient arrives today without complaint. He had been in a cast all week noted that he had heavy drainage this week. This resulted in large raised areas of macerated tissue around the wound 1/14; wound bed looks better slightly smaller. Hydrofera Blue has been changing himself. He had a heavy drainage last week which caused a lot of maceration around the wound so I took him out of a total contact cast he says the drainage is actually better this week He is seen today in conjunction with HBO 1/21; returns to clinic. He was up in [place] for a day or 2 attending a funeral. He comes back in with the wound larger and with a large area of exposed bone. He had osteomyelitis and septic arthritis of the fifth left metatarsal head while he was in hospital. He received IV antibiotics in the hospital for a prolonged period of time then 3 weeks of Augmentin. Subsequently I gave him 2 weeks of doxycycline for more superficial wound infection. When I saw this last week the wound was smaller the surface of the wound looks satisfactory. 1/28; patient missed hyperbarics today. Bone biopsy I did last time showed Enterococcus faecalis and Staphylococcus lugdunensis . He has a wide area of exposed bone. We are going to use silver alginate as of today. I had another ethical discussion  with the patient. 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 In this situation I think the likelihood of healing this is low. Therefore I have recommended a ray amputation and with the patient's agreement I have referred him to Dr. Doran Durand. The other issue is that his compliance with hyperbarics has been minimal because of his work schedule and given his underlying decision I am going to stop this today READMISSION 10/24/2019 MRI 09/29/2019 left foot IMPRESSION: 1. Apparent skin ulceration inferior and lateral to the 5th metatarsal base with underlying heterogeneous T2  signal and enhancement in the subcutaneous fat. Small peripherally enhancing fluid collections along the plantar and lateral aspects of the 5th metatarsal base suspicious for abscesses. 2. Interval amputation through the mid 5th metatarsal with nonspecific low-level marrow edema and enhancement. Given the proximity to the adjacent soft tissue inflammatory changes, osteomyelitis cannot be excluded. 3. The additional bones appear unremarkable. MRI 09/29/2019 right foot IMPRESSION: 1. Soft tissue ulceration lateral to the 5th MTP joint. There is low-level T2 hyperintensity within the 4th and 5th metatarsal heads and adjacent proximal phalanges without abnormal T1 signal or cortical destruction. These findings are nonspecific and could be seen with early marrow edema, hyperemia or early osteomyelitis. No evidence of septic joint. 2. Mild tenosynovitis and synovial enhancement associated with the extensor digitorum tendons at the level of the midfoot. 3. Diffuse low-level muscular T2 hyperintensity and enhancement, most consistent with diabetic myopathy. LEFT FOOT BONE Methicillin resistant staphylococcus aureus Staphylococcus lugdunensis MIC MIC CIPROFLOXACIN >=8 RESISTANT Resistant <=0.5 SENSI... Sensitive CLINDAMYCIN <=0.25 SENS... Sensitive >=8 RESISTANT Resistant ERYTHROMYCIN >=8  RESISTANT Resistant >=8 RESISTANT Resistant GENTAMICIN <=0.5 SENSI... Sensitive <=0.5 SENSI... Sensitive Inducible Clindamycin NEGATIVE Sensitive NEGATIVE Sensitive OXACILLIN >=4 RESISTANT Resistant 2 SENSITIVE Sensitive RIFAMPIN <=0.5 SENSI... Sensitive <=0.5 SENSI... Sensitive TETRACYCLINE <=1 SENSITIVE Sensitive <=1 SENSITIVE Sensitive TRIMETH/SULFA <=10 SENSIT Sensitive <=10 SENSIT Sensitive ... Marland Kitchen.. VANCOMYCIN 1 SENSITIVE Sensitive <=0.5 SENSI... Sensitive Right foot bone . Component 3 wk ago Specimen Description BONE Special Requests RIGHT 4 METATARSAL SAMPLE B Gram Stain NO WBC SEEN NO ORGANISMS SEEN Culture RARE METHICILLIN RESISTANT STAPHYLOCOCCUS AUREUS NO ANAEROBES ISOLATED Performed at [facility], [place] [address], [place], [place] [address] Report Status 10/08/2019 FINAL Organism ID, Bacteria METHICILLIN RESISTANT STAPHYLOCOCCUS AUREUS Resulting Agency [facility] Susceptibility Methicillin resistant staphylococcus aureus MIC CIPROFLOXACIN >=8 RESISTANT Resistant CLINDAMYCIN <=0.25 SENS... Sensitive ERYTHROMYCIN >=8 RESISTANT Resistant GENTAMICIN <=0.5 SENSI... Sensitive Inducible Clindamycin NEGATIVE Sensitive OXACILLIN >=4 RESISTANT Resistant RIFAMPIN <=0.5 SENSI... Sensitive TETRACYCLINE <=1 SENSITIVE Sensitive TRIMETH/SULFA <=10 SENSIT Sensitive ... VANCOMYCIN 1 SENSITIVE Sensitive This is a patient we had in clinic earlier this year with a wound over his left fifth metatarsal head. 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 Doran Durand on 05/16/2019. He seems to have developed problems with wounds on his bilateral feet in June 2021 although he may have had problems earlier than  this. He was in an urgent care with a right foot ulcer on 09/26/2019 and given a course of doxycycline. This was apparently after having trouble getting into see orthopedics. He was seen by podiatry on 09/28/2019 noted to have bilateral lower extremity ulcers including the left lateral fifth metatarsal base and the right subfifth met head. It was noted that had purulent drainage at that time. He required hospitalization from 6/20 through 7/2. This was because of worsening right foot wounds. He underwent bilateral operative incision and drainage and bone biopsies bilaterally. Culture results are listed above. He has been referred back to clinic by Dr. Jacqualyn Posey of podiatry. He is also followed by Dr. Megan Salon who saw him yesterday. He was discharged from hospital on Zyvox Flagyl and Levaquin and yesterday changed to doxycycline Flagyl and Levaquin. His inflammatory markers on 6/26 showed a sedimentation rate of 129 and a C-reactive protein of 5. This  is improved to 14 and 1.3 respectively. This would indicate improvement. ABIs in our clinic today were 1.23 on the right and 1.20 on the left 11/01/2019 on evaluation today patient appears to be doing fairly well in regard to the wounds on his feet at this point. Fortunately there is no signs of active infection at this time. No fevers, chills, nausea, vomiting, or diarrhea. He currently is seeing infectious disease and still under their care at this point. Subsequently he also has both wounds which she has not been using collagen on as he did not receive that in his packaging he did not call us and let us know that. Apparently that just was missed on the order. Nonetheless we will get that straightened out today. 8/9-Patient returns for bilateral foot wounds, using Prisma with hydrogel moistened dressings, and the wounds appear stable. Patient using surgical shoes, avoiding much pressure or weightbearing as much as possible 8/16; patient has bilateral foot  wounds. 1 on the right lateral foot proximally the other is on the left mid lateral foot. Both required debridement of callus and thick skin around the wounds. We have been using silver collagen 8/27; patient has bilateral lateral foot wounds. The area on the left substantially surrounded by callus and dry skin. This was removed from the wound edge. The underlying wound is small. The area on the right measured somewhat smaller today. 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 Unfortunately I did not update his antibiotics during today's visit. 9/10 I [address] Dr. Hale Bogus last notes he felt he had completed antibiotics his inflammatory markers were reasonably well controlled. He has a small wound on the lateral left foot and a tiny area on the right which is just above closed. He is using Hydrofera Blue with border foam he has bilateral surgical shoes 9/24; 2 week f/u. doing well. right foot is closed. left foot still undermined. 10/14; right foot remains closed at the fifth met head. The area over the base of the left fifth metatarsal has a small open area but considerable undermining towards the plantar foot. Thick callus skin around this suggests an adequate pressure relief. We have talked about this. He says he is going to go back into his cam boot. I suggested a total contact cast he did not seem enamored with this suggestion 10/26; left foot base of the fifth metatarsal. Same condition as last time. He has skin over the area with an open wound however the skin is not adherent. He went to see Dr. Earleen Newport who did an x-ray and culture of his foot I have not reviewed the x-ray but the patient was not told anything. He is on doxycycline 11/11; since the patient was last here he was in the emergency room on 10/30 he was concerned about swelling in the left foot. They did not do any cultures or x-rays. They changed his antibiotics to  cephalexin. Previous culture showed group B strep. The cephalexin is appropriate as doxycycline has less than predictable coverage. Arrives in clinic today with swelling over this area under the wound. He also has a new wound on the right fifth metatarsal head 11/18; the patient has a difficult wound on the lateral aspect of the left fifth metatarsal head. The wound was almost ballotable last week I opened it slightly expecting to see purulence however there was just bleeding. I cultured this this was negative. X-ray unchanged. We are trying to get an  MRI but I am not sure were going to be able to get this through his insurance. He also has an area on the right lateral fifth metatarsal head this looks healthier 12/3; the patient finally got our MRI. Surprisingly this did not show osteomyelitis. I did show the soft tissue ulceration at the lateral plantar aspect of the fifth metatarsal base with a tiny residual 6 mm abscess overlying the superficial fascia I have tried to culture this area I have not been able to get this to grow anything. Nevertheless the protruding tissue looks aggravated. I suspect we should try to treat the underlying "abscess with broad-spectrum antibiotics. I am going to start him on Levaquin and Flagyl. He has much less edema in his legs and I am going to continue to wrap his legs and see him weekly 12/10. I started Levaquin and Flagyl on him last week. He just picked up the Flagyl apparently there was some delay. The worry is the wound on the left fifth metatarsal base which is substantial and worsening. His foot looks like he inverts at the ankle making this a weightbearing surface. Certainly no improvement in fact I think the measurements of this are somewhat worse. We have been using 12/17; he apparently just got the Levaquin yesterday this is 2 weeks after the fact. He has completed the Flagyl. The area over the left fifth metatarsal base still has protruding granulation  tissue although it does not look quite as bad as it did some weeks ago. He has severe bilateral lymphedema although we have not been treating him for wounds on his legs this is definitely going to require compression. There was so much edema in the left I did not wish to put him in a total contact cast today. I am going to increase his compression from 3-4 layer. The area on the right lateral fifth met head actually look quite good and superficial. 12/23; patient arrived with callus on the right fifth met head and the substantial hyper granulated callused wound on the base of his fifth metatarsal. He says he is completing his Levaquin in 2 days but I do not think that adds up with what I gave him but I will have to double check this. We are using Hydrofera Blue on both areas. My plan is to put the left leg in a cast the week after New Year's 04/06/2020; patient's wounds about the same. Right lateral fifth metatarsal head and left lateral foot over the base of the fifth metatarsal. There is undermining on the left lateral foot which I removed before application of total contact cast continuing with Hydrofera Blue new. Patient tells me he was seen by endocrinology today lab work was done [Dr. Kerr]. Also wondering whether he was referred to cardiology. I went over some lab work from previously does not have chronic renal failure certainly not nephrotic range proteinuria he does have very poorly controlled diabetes but this is not his most updated lab work. Hemoglobin A1c has been over 11 1/10; the patient had a considerable amount of leakage towards mid part of his left foot with macerated skin however the wound surface looks better the area on the right lateral fifth met head is better as well. I am going to change the dressing on the left foot under the total contact cast to silver alginate, continue with Hydrofera Blue on the right. 1/20; patient was in the total contact cast for 10 days. Considerable  amount of drainage although the skin  around the wound does not look too bad on the left foot. The area on the right fifth metatarsal head is closed. Our nursing staff reports large amount of drainage out of the left lateral foot wound 1/25; continues with copious amounts of drainage described by our intake staff. PCR culture I did last week showed E. coli and Enterococcus faecalis and low quantities. Multiple resistance genes documented including extended spectrum beta lactamase, MRSA, MRSE, quinolone, tetracycline. The wound is not quite as good this week as it was 5 days ago but about the same size 2/3; continues with copious amounts of malodorous drainage per our intake nurse. The PCR culture I did 2 weeks ago showed E. coli and low quantities of Enterococcus. There were multiple resistance genes detected. I put Neosporin on him last week although this does not seem to have helped. The wound is slightly deeper today. Offloading continues to be an issue here although with the amount of drainage she has a total contact cast is just not going to work 2/10; moderate amount of drainage. Patient reports he cannot get his stocking on over the dressing. I told him we have to do that the nurse gave him suggestions on how to make this work. The wound is on the bottom and lateral part of his left foot. Is cultured predominantly grew low amounts of Enterococcus, E. coli and anaerobes. There were multiple resistance genes detected including extended spectrum beta lactamase, quinolone, tetracycline. I could not think of an easy oral combination to address this so for now I am going to do topical antibiotics provided by [facility] I think the main agents here are vancomycin and an aminoglycoside. We have to be able to give him access to the wounds to get the topical antibiotic on 2/17; moderate amount of drainage this is unchanged. He has his Keystone topical antibiotic against the deep tissue culture organisms. He  has been using this and changing the dressing daily. Silver alginate on the wound surface. 2/24; using Keystone antibiotic with silver alginate on the top. He had too much drainage for a total contact cast at one point although I think that is improving and I think in the next week or 2 it might be possible to replace a total contact cast I did not do this today. In general the wound surface looks healthy however he continues to have thick rims of skin and subcutaneous tissue around the wide area of the circumference which I debrided 06/04/2020 upon evaluation today patient appears to be doing well in regard to his wound. I do feel like he is showing signs of improvement. There is little bit of callus and dead tissue around the edges of the wound as well as what appears to be a little bit of a sinus tract that is off to the side laterally I would perform debridement to clear that away today. 3/17; left lateral foot. The wound looks about the same as I remember. Not much depth surface looks healthy. No evidence of infection 3/25; left lateral foot. Wound surface looks about the same. Separating epithelium from the circumference. There really is no evidence of infection here however not making progress by my view 3/29; left lateral foot. Surface of the wound again looks reasonably healthy still thick skin and subcutaneous tissue around the wound margins. There is no evidence of infection. One of the concerns being brought up by the nurses has again the amount of drainage vis--vis continued use of a total contact cast 4/5;  left lateral foot at roughly the base of the fifth metatarsal. Nice healthy looking granulated tissue with rims of epithelialization. The overall wound measurements are not any better but the tissue looks healthy. The only concern is the amount of drainage although he has no surrounding maceration with what we have been doing recently to absorb fluid and protect his skin. He also has  lymphedema. 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 He states that the kerlix/coban under the TCC rolled down and could not get it back up. He has been using an offloading scooter and has somehow hurt his right foot using this device. This happened last week. He states that the side of his right foot developed a blister and opened. The top of his foot also has a few small open wounds he thinks is due to his socks rubbing in his shoes. He has not been using any dressings to the wound. He denies purulent drainage, fever/chills or erythema to the wounds. 4/22; patient presents for 1 week follow-up. He developed new wounds to the right foot that were evaluated at last clinic visit. He continues to have a total contact cast to the left leg and he reports no issues. He has been using silver collagen to the right foot wounds with no issues. He denies purulent drainage, fever/chills or erythema to the right foot wounds. He has no complaints today 4/25; patient presents for 1 week follow-up. He has a total contact cast of the left leg and reports no issues. He has been using silver alginate to the right foot wound. He denies purulent drainage, fever/chills or erythema to the right foot wounds. 5/2 patient presents for 1 week follow-up. T contact cast on the left. The wound which is on the base of the plantar foot at the base of the fifth metatarsal otal actually looks quite good and dimensions continue to gradually contract. HOWEVER the area on the right lateral fifth metatarsal head is much larger than what I remember from 2 weeks ago. Once more is he has significant levels of hypergranulation. Noteworthy that he had this same hyper granulated response on his  wound on the left foot at one point in time. So much so that he I thought there was an underlying fluid collection. Based on this I think this just needs debridement. 5/9; the wound on the left actually continues to be gradually smaller with a healthy surface. Slight amount of drainage and maceration of the skin around but not too bad. However he has a large wound over the right fifth metatarsal head very much in the same configuration as his left foot wound was initially. I used silver nitrate to address the hyper granulated tissue no mechanical debridement 5/16; area on the left foot did not look as healthy this week deeper thick surrounding macerated skin and subcutaneous tissue. The area on the right foot fifth met head was about the same The area on the right ankle that we identified last week is completely broken down into an open wound presumably a stocking rubbing issue 5/23; patient has been using a total contact cast to the left side. He has been using silver alginate underneath. He has also been using silver alginate to the right  foot wounds. He has no complaints today. He denies any signs of infection. 5/31; the left-sided wound looks some better measure smaller surface granulation looks better. We have been using silver alginate under the total contact cast The large area on his right fifth met head and right dorsal foot look about the same still using silver alginate 6/6; neither side is good as I was hoping although the surface area dimensions are better. A lot of maceration on his left and right foot around the wound edge. Area on the dorsal right foot looks better. He says he was traveling. I am not sure what does the amount of maceration around the plantar wounds may be drainage issues 6/13; in general the wound surfaces look quite good on both sides. Macerated skin and raised edges around the wound required debridement although in general especially on the left the surface area  seems improved. The area on the right dorsal ankle is about the same I thought this would not be such a problem to close 6/20; not much change in either wound although the one on the right looks a little better. Both wounds have thick macerated edges to the skin requiring debridements. We have been using silver alginate. The area on the dorsal right ankle is still open I thought this would be closed. 6/28; patient comes in today with a marked deterioration in the right foot wound fifth met head. Wide area of exposed bone this is a drastic change from last time. The area on the left there we have been casting is stagnant. We have been using silver alginate in both wound areas. 7/5; bone culture I did for PCR last time was positive for Pseudomonas, group B strep, Enterococcus and Staph aureus. There was no suggestion of methicillin resistance or ampicillin resistant genes. This was resistant to tetracycline however He comes into the clinic today with the area over his right plantar fifth metatarsal head which had been doing so well 2 weeks ago completely necrotic feeling bone. I do not know that this is going to be salvageable. The left foot wound is certainly no smaller but it has a better surface and is superficial. 7/8; patient called in this morning to say that his total contact cast was rubbing against his foot. He states he is doing fine overall. He denies signs of infection. 7/12; continued deterioration in the wound over the right fifth metatarsal head crumbling bone. This is not going to be salvageable. The patient agrees and wants to be referred to Dr. Doran Durand which we will attempt to arrange as soon as possible. I am going to continue him on antibiotics as long as that takes so I will renew those today. The area on the left foot which is the base of the fifth metatarsal continues to look somewhat better. Healthy looking tissue no depth no debridement is necessary here. 7/20; the patient was  kindly seen by Dr. Doran Durand of orthopedics on 10/19/2020. 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 Towards this end we have taken him out of the total contact cast on the left we will put him in a wrap with Hydrofera Blue. As I understand things surgery is planned for 7/21 7/27; patient had his surgery last Thursday. 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 Apparently everything went well we did not still disturb that today The area on the left foot  actually looks quite good. He has been much less mobile which probably explains this he did not seem to do well in the total contact cast secondary to drainage and maceration I think. 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 Fortunately there is no evidence of active infection at this time. No fevers, chills, nausea, vomiting, or diarrhea. Overall I think that he is actually doing extremely well. Nonetheless I do believe that he is staying off of this more following the surgery in his right foot that is the reason the left is doing so great. 8/16; left plantar foot wound. This looks smaller than the last time I saw this he is using Hydrofera Blue. The surgical wound on the right foot is being followed by Dr. Doran Durand we did not look at this today. He has surgical shoes on both feet 8/23; left plantar foot wound not as good this week. Surrounding macerated skin and subcutaneous tissue everything looks moist and wet. I do not think he is offloading this adequately. He is using a surgical shoe Apparently the right foot surgical wound is not open although I did not check his foot 8/31; left plantar foot lateral aspect. Much improved this week. He has no maceration. Some improvement in the surface area of the wound but most impressively the depth is come in we  are using silver alginate. The patient is a Product/process development scientist. He is asked that we write him a letter so he can go back to work. I have also tried to see if we can write something that will allow him to limit the amount of time that he is on his foot at work. 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 Hopefully the latter can be avoided Electronic Signature(s) Signed: 12/02/2020 4:28:25 PM By: [name] MD Entered By: [name] on 12/02/2020 35:70:17 -------------------------------------------------------------------------------- Physical Exam Details Patient Name: Date of Service: [name], [name] 12/02/2020 7:30 A M Medical Record Number: [id number] Patient Account Number: [id number] Date of Birth/Sex: Treating RN: June 27, 1985 (35 y.o. [name] Primary Care Provider: [name] Other Clinician: Referring Provider: Treating Provider/Extender: [name] in Treatment: 49 Constitutional Patient is hypertensive.. Pulse regular and within target range for patient.Marland Kitchen Respirations regular, non-labored and within target range.. Temperature is normal and within the target range for the patient.Marland Kitchen Appears in no distress. Notes Wound exam; left plantar foot at the base of the fifth metatarsal. The maceration that we noted last week is better. Surface of the wound looks better after last week's debridement. There is no evidence of surrounding infection. Impressively the wound depth is come in much better. Electronic Signature(s) Signed: 12/02/2020 4:28:25 PM By: [name] MD Entered By: [name] on 12/02/2020 08:20:20 -------------------------------------------------------------------------------- Physician Orders Details Patient Name: Date of Service: [name], [name] 12/02/2020 7:30 A M Medical Record Number: [id number] Patient Account Number:  [id number] Date of Birth/Sex: Treating RN: 05-Apr-1985 (35 y.o. [name]: [name] Other Clinician: Referring Provider: Treating Provider/Extender: [name] in Treatment: 14 Verbal / Phone Orders: No Diagnosis Coding Follow-up Appointments ppointment in 1 week. - with Dr. Dellia Nims Return A Edema Control - Lymphedema / SCD / Other Bilateral Lower Extremities Elevate legs to the level of the  heart or above for 30 minutes daily and/or when sitting, a frequency of: - throughout the day Avoid standing for long periods of time. Exercise regularly Moisturize legs daily. - right leg every night before bed. Compression stocking or Garment 20-30 mm/Hg pressure to: - Apply to right leg in the morning and remove at night. Off-Loading Open toe surgical shoe to: - bilateral feet Wound Treatment Wound #3 - Foot Wound Laterality: Left, Lateral Cleanser: Soap and Water Every Other Day/30 Days Discharge Instructions: May shower and wash wound with dial antibacterial soap and water prior to dressing change. Cleanser: Wound Cleanser (DME) (Generic) Every Other Day/30 Days Discharge Instructions: Cleanse the wound with wound cleanser prior to applying a clean dressing using gauze sponges, not tissue or cotton balls. Peri-Wound Care: Zinc Oxide Ointment 30g tube Every Other Day/30 Days Discharge Instructions: Apply Zinc Oxide to periwound with each dressing change as needed. Prim Dressing: KerraCel Ag Gelling Fiber Dressing, 4x5 in (silver alginate) (Generic) Every Other Day/30 Days ary Discharge Instructions: Apply silver alginate to wound bed as instructed Secondary Dressing: Woven Gauze Sponge, Non-Sterile 4x4 in (Generic) Every Other Day/30 Days Discharge Instructions: Apply over primary dressing as directed. Secondary Dressing: Zetuvit Plus 4x8 in (Generic) Every Other Day/30 Days Discharge Instructions: Apply over primary dressing as  directed. Secondary Dressing: Optifoam Non-Adhesive Dressing, 4x4 in (Generic) Every Other Day/30 Days Discharge Instructions: Apply over primary dressing as directed. Secured With: Coban Self-Adherent Wrap 4x5 (in/yd) (Generic) Every Other Day/30 Days Discharge Instructions: Secure with Coban as directed. Secured With: [employer], 4.5x3.1 (in/yd) (DME) (Generic) Every Other Day/30 Days Discharge Instructions: Secure with Kerlix as directed. Secured With: 60M Medipore H Soft Cloth Surgical T 4 x 2 (in/yd) (DME) (Generic) Every Other Day/30 Days ape Discharge Instructions: Secure dressing with tape as directed. Electronic Signature(s) Signed: 12/02/2020 4:28:25 PM By: [name] MD Signed: 12/03/2020 4:23:50 PM By: Rhae Hammock RN Entered By: Rhae Hammock on 12/02/2020 08:09:55 -------------------------------------------------------------------------------- Problem List Details Patient Name: Date of Service: [name], [name] 12/02/2020 7:30 A M Medical Record Number: [id number] Patient Account Number: [id number] Date of Birth/Sex: Treating RN: 06/10/1985 (35 y.o. [name] Primary Care Provider: [name] Other Clinician: Referring Provider: Treating Provider/Extender: [name] in Treatment: 68 Active Problems ICD-10 Encounter Code Description Active Date MDM Diagnosis E11.621 Type 2 diabetes mellitus with foot ulcer 10/24/2019 No Yes L97.528 Non-pressure chronic ulcer of other part of left foot with other specified 10/24/2019 No Yes severity Inactive Problems ICD-10 Code Description Active Date Inactive Date L97.518 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ity 10/24/2019 10/24/2019 L97.518 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ity 07/14/2020 07/14/2020 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, right ankle and foot 10/24/2019 10/24/2019 L97.318  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 08/10/2020 08/10/2020 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, left ankle and foot 10/24/2019 10/24/2019 B95.62 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 10/24/2019 10/24/2019 classified elsewhere Resolved Problems Electronic Signature(s) Signed: 12/02/2020 4:28:25 PM By: [name] MD Entered By: [name] on 12/02/2020 08:16:43 -------------------------------------------------------------------------------- Progress Note Details Patient Name: Date of Service: [name], [name] 12/02/2020 7:30 A M Medical Record Number: [id number] Patient Account Number: [id number] Date of Birth/Sex: Treating RN: 1985/09/20 (35 y.o. 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 He comes in for review of a wound over the plantar fifth metatarsal head extending into the lateral part of the foot. He was followed for this previously by his podiatrist Dr. Cornelius Moras. As the patient tells his story he went to see podiatry first for a swelling he developed on the lateral part of his fifth metatarsal head in May. He states this was "open" by podiatry and the area closed. He was followed up in June and it was again opened callus removed and it closed promptly. There were plans being made for surgery on the fifth metatarsal head in June however his blood sugar was apparently too high for anesthesia. Apparently the area was debrided and opened again in June and it is never closed since. Looking over the records from podiatry I am really not able to follow this. It was clear when he was first seen it was before 5/14 at that point  he already had a wound. By 5/17 the ulcer was resolved. I do not see anything about a procedure. On 5/28 noted to have pre-ulcerative moderate keratosis. X-ray noted 1/5 contracted toe and tailor's bunion and metatarsal deformity. On a visit date on 09/28/2018 the dorsal part of the left foot it healed and resolved. There was concern about swelling in his lower extremity he was sent to the ER.. As far as I can tell he was seen in the ER on 7/12 with an ulcer on his left foot. A DVT rule out of the left leg was negative. I do not think I have complete records from podiatry but I am not able to verify the procedures this patient states he had. He states after the last procedure the wound has never closed although I am not able to follow this in the records I have from podiatry. He has not had a recent x-ray The patient has been using Neosporin on the wound. He is wearing a Darco shoe. He is still very active up on his foot working and exercising. Past medical history; type 2 diabetes ketosis-prone, leg swelling with a negative DVT study in July. Non-smoker ABI in our clinic was 0.85 on the left 10/16; substantial wound on the plantar left fifth met head extending laterally almost to the dorsal fifth MTP. We have been using silver alginate we gave him a Darco forefoot off loader. An x-ray did not show evidence of osteomyelitis did note soft tissue emphysema which I think was due to gas tracking through an open wound. There is no doubt in my mind he requires an MRI 10/23; MRI not booked until 3 November at the earliest this is largely due to his glucose sensor in the right arm. We have been using silver alginate. There has been an improvement 10/29; I am still not exactly sure when his MRI is booked for. He says it is the third but it is the 10th in epic. This definitely needs to be done. He is running a low-grade fever today but no other symptoms. No real improvement in the 1 34/24/2020 patient presents  today for a follow-up visit here in our clinic he is last been seen in the clinic on October 29. Subsequently we were working on getting MRI to evaluate and see what exactly was going on and where we would need to go from the standpoint of whether or not he had osteomyelitis and again what treatments were  going be required. Subsequently the patient ended up being admitted to the hospital on 02/07/2019 and was discharged on 02/14/2019. This is a somewhat interesting admission with a discharge diagnosis of pneumonia due to COVID-19 although he was positive for COVID-19 when tested at the urgent care but negative x2 when he was actually in the hospital. With that being said he did have acute respiratory failure with hypoxia and it was noted he also have a left foot ulceration with osteomyelitis. With that being said he did require oxygen for his pneumonia and I level 4 L. He was placed on antivirals and steroids for the COVID-19. He was also transferred to the [facility] at one point. Nonetheless he did subsequently discharged home and since being home has done much better in that regard. The CT angiogram did not show any pulmonary embolism. With regard to the osteomyelitis the patient was placed on vancomycin and Zosyn while in the hospital but has been changed to Augmentin at discharge. It was also recommended that he follow- up with wound care and podiatry. Podiatry however wanted him to see [name] according to the patient prior to them doing anything further. His hemoglobin A1c was 9.9 as noted in the hospital. Have an MRI of the left foot performed while in the hospital on 02/04/2019. 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 There is an overlying plantar open wound noted an abscess tracking back along the lateral aspect of the fifth metatarsal shaft. There is otherwise diffuse cellulitis and mild  fasciitis without findings of polymyositis. The patient did have recently pneumonia secondary to COVID-19 I looked in the chart through epic and it does appear that the patient may need to have an additional x-ray just to ensure everything is cleared and that he has no airspace disease prior to putting him into the chamber. 03/05/2019; patient was readmitted to the clinic last week. He was hospitalized twice for a viral upper respiratory tract infection from 11/1 through 11/4 and then 11/5 through 11/12 ultimately this turned out to be Covid pneumonitis. Although he was discharged on oxygen he is not using it. He says he feels fine. He has no exercise limitation no cough no sputum. His O2 sat in our clinic today was 100% on room air. 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 He received Vanco and Zosyn in the hospital and then was discharged on 2 weeks of Augmentin. I do not see any relevant cultures. He was supposed to follow-up with infectious disease but I do not see that he has an appointment. 12/8; patient saw Dr. Novella Olive of infectious disease last week. He felt that he had had adequate antibiotic therapy. He did not go to follow-up with Dr. Amalia Hailey of podiatry and I have again talked to him about the pros and cons of this. He does not want to consider a ray amputation of this time. He is aware of the risks of recurrence, migration etc. He started HBO today and tolerated this well. He can complete the Augmentin that I gave him last week. I have looked over the lab work that Dr. Chana Bode ordered his C-reactive protein was 3.3 and his sedimentation rate was 17. The C-reactive protein is never really been measurably that high in this patient 12/15; not much change in the wound today  however he has undermining along the lateral part of the foot again more extensively than last week. He has  some rims of epithelialization. We have been using silver alginate. He is undergoing hyperbarics but did not dive today 12/18; in for his obligatory first total contact cast change. Unfortunately there was pus coming from the undermining area around his fifth metatarsal head. This was cultured but will preclude reapplication of a cast. He is seen in conjunction with HBO 12/24; patient had staph lugdunensis in the wound in the undermining area laterally last time. We put him on doxycycline which should have covered this. The wound looks better today. I am going to give him another week of doxycycline before reattempting the total contact cast 12/31; the patient is completing antibiotics. Hemorrhagic debris in the distal part of the wound with some undermining distally. He also had hyper granulation. Extensive debridement with a #5 curette. The infected area that was on the lateral part of the fifth met head is closed over. I do not think he needs any more antibiotics. Patient was seen prior to HBO. Preparations for a total contact cast were made in the cast will be placed post hyperbarics 04/11/19; once again the patient arrives today without complaint. He had been in a cast all week noted that he had heavy drainage this week. This resulted in large raised areas of macerated tissue around the wound 1/14; wound bed looks better slightly smaller. Hydrofera Blue has been changing himself. He had a heavy drainage last week which caused a lot of maceration around the wound so I took him out of a total contact cast he says the drainage is actually better this week He is seen today in conjunction with HBO 1/21; returns to clinic. He was up in [place] for a day or 2 attending a funeral. He comes back in with the wound larger and with a large area of exposed bone. He had osteomyelitis and septic arthritis of the fifth left metatarsal head while he was in hospital. He received IV antibiotics in the hospital for a  prolonged period of time then 3 weeks of Augmentin. Subsequently I gave him 2 weeks of doxycycline for more superficial wound infection. When I saw this last week the wound was smaller the surface of the wound looks satisfactory. 1/28; patient missed hyperbarics today. Bone biopsy I did last time showed Enterococcus faecalis and Staphylococcus lugdunensis . He has a wide area of exposed bone. We are going to use silver alginate as of today. I had another ethical discussion with the patient. 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 In this situation I think the likelihood of healing this is low. Therefore I have recommended a ray amputation and with the patient's agreement I have referred him to Dr. Doran Durand. The other issue is that his compliance with hyperbarics has been minimal because of his work schedule and given his underlying decision I am going to stop this today READMISSION 10/24/2019 MRI 09/29/2019 left foot IMPRESSION: 1. Apparent skin ulceration inferior and lateral to the 5th metatarsal base with underlying heterogeneous T2 signal and enhancement in the subcutaneous fat. Small peripherally enhancing fluid collections along the plantar and lateral aspects of the 5th metatarsal base suspicious for abscesses. 2. Interval amputation through the mid 5th metatarsal with nonspecific low-level marrow edema and enhancement. Given the proximity to the adjacent soft tissue inflammatory changes, osteomyelitis cannot be excluded. 3. The additional bones appear unremarkable. MRI 09/29/2019 right foot IMPRESSION:  1. Soft tissue ulceration lateral to the 5th MTP joint. There is low-level T2 hyperintensity within the 4th and 5th metatarsal heads and adjacent proximal phalanges without abnormal T1 signal or cortical destruction. These findings are nonspecific and could be seen with early marrow edema, hyperemia or early osteomyelitis. No evidence of septic  joint. 2. Mild tenosynovitis and synovial enhancement associated with the extensor digitorum tendons at the level of the midfoot. 3. Diffuse low-level muscular T2 hyperintensity and enhancement, most consistent with diabetic myopathy. LEFT FOOT BONE Methicillin resistant staphylococcus aureus Staphylococcus lugdunensis MIC MIC CIPROFLOXACIN >=8 RESISTANT Resistant <=0.5 SENSI... Sensitive CLINDAMYCIN <=0.25 SENS... Sensitive >=8 RESISTANT Resistant ERYTHROMYCIN >=8 RESISTANT Resistant >=8 RESISTANT Resistant GENTAMICIN <=0.5 SENSI... Sensitive <=0.5 SENSI... Sensitive Inducible Clindamycin NEGATIVE Sensitive NEGATIVE Sensitive OXACILLIN >=4 RESISTANT Resistant 2 SENSITIVE Sensitive RIFAMPIN <=0.5 SENSI... Sensitive <=0.5 SENSI... Sensitive TETRACYCLINE <=1 SENSITIVE Sensitive <=1 SENSITIVE Sensitive TRIMETH/SULFA <=10 SENSIT Sensitive <=10 SENSIT Sensitive ... Marland Kitchen.. VANCOMYCIN 1 SENSITIVE Sensitive <=0.5 SENSI... Sensitive Right foot bone . Component 3 wk ago Specimen Description BONE Special Requests RIGHT 4 METATARSAL SAMPLE B Gram Stain NO WBC SEEN NO ORGANISMS SEEN Culture RARE METHICILLIN RESISTANT STAPHYLOCOCCUS AUREUS NO ANAEROBES ISOLATED Performed at [facility], [place] [address] [place], [place] [address] Report Status 10/08/2019 FINAL Organism ID, Bacteria METHICILLIN RESISTANT STAPHYLOCOCCUS AUREUS Resulting Agency [facility] Susceptibility Methicillin resistant staphylococcus aureus MIC CIPROFLOXACIN >=8 RESISTANT Resistant CLINDAMYCIN <=0.25 SENS... Sensitive ERYTHROMYCIN >=8 RESISTANT Resistant GENTAMICIN <=0.5 SENSI... Sensitive Inducible Clindamycin NEGATIVE Sensitive OXACILLIN >=4 RESISTANT Resistant RIFAMPIN <=0.5 SENSI... Sensitive TETRACYCLINE <=1 SENSITIVE Sensitive TRIMETH/SULFA <=10 SENSIT Sensitive ... VANCOMYCIN 1 SENSITIVE Sensitive This is a patient we had in clinic earlier this year with a wound over his left fifth metatarsal head. 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 Doran Durand on 05/16/2019. He seems to have developed problems with wounds on his bilateral feet in June 2021 although he may have had problems earlier than this. He was in an urgent care with a right foot ulcer on 09/26/2019 and given a course of doxycycline. This was apparently after having trouble getting into see orthopedics. He was seen by podiatry on 09/28/2019 noted to have bilateral lower extremity ulcers including the left lateral fifth metatarsal base and the right subfifth met head. It was noted that had purulent drainage at that time. He required hospitalization from 6/20 through 7/2. This was because of worsening right foot wounds. He underwent bilateral operative incision and drainage and bone biopsies bilaterally. Culture results are listed above. He has been referred back to clinic by Dr. Jacqualyn Posey of podiatry. He is also followed by Dr. Megan Salon who saw him yesterday. He was discharged from hospital on Zyvox Flagyl and Levaquin and yesterday changed to doxycycline Flagyl and Levaquin. His inflammatory markers on 6/26 showed a sedimentation rate of 129 and a C-reactive protein of 5. This is improved to 14 and 1.3 respectively. This would indicate improvement. ABIs in our clinic today were 1.23 on the right and 1.20 on the left 11/01/2019 on evaluation today patient appears to be doing fairly well in regard to the wounds on his feet at this point. Fortunately there is no signs of active infection at this time. No fevers, chills, nausea, vomiting, or diarrhea.  He currently is seeing infectious disease and still under their care at this point. Subsequently he also has both wounds  which she has not been using collagen on as he did not receive that in his packaging he did not call us and let us know that. Apparently that just was missed on the order. Nonetheless we will get that straightened out today. 8/9-Patient returns for bilateral foot wounds, using Prisma with hydrogel moistened dressings, and the wounds appear stable. Patient using surgical shoes, avoiding much pressure or weightbearing as much as possible 8/16; patient has bilateral foot wounds. 1 on the right lateral foot proximally the other is on the left mid lateral foot. Both required debridement of callus and thick skin around the wounds. We have been using silver collagen 8/27; patient has bilateral lateral foot wounds. The area on the left substantially surrounded by callus and dry skin. This was removed from the wound edge. The underlying wound is small. The area on the right measured somewhat smaller today. 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 Unfortunately I did not update his antibiotics during today's visit. 9/10 I reviewed Dr. Hale Bogus last notes he felt he had completed antibiotics his inflammatory markers were reasonably well controlled. He has a small wound on the lateral left foot and a tiny area on the right which is just above closed. He is using Hydrofera Blue with border foam he has bilateral surgical shoes 9/24; 2 week f/u. doing well. right foot is closed. left foot still undermined. 10/14; right foot remains closed at the fifth met head. The area over the base of the left fifth metatarsal has a small open area but considerable undermining towards the plantar foot. Thick callus skin around this suggests an adequate pressure relief. We have talked about this. He says he is going to go back into his cam boot. I suggested a total contact cast he did not seem enamored with this suggestion 10/26; left foot base of the fifth  metatarsal. Same condition as last time. He has skin over the area with an open wound however the skin is not adherent. He went to see Dr. Earleen Newport who did an x-ray and culture of his foot I have not reviewed the x-ray but the patient was not told anything. He is on doxycycline 11/11; since the patient was last here he was in the emergency room on 10/30 he was concerned about swelling in the left foot. They did not do any cultures or x-rays. They changed his antibiotics to cephalexin. Previous culture showed group B strep. The cephalexin is appropriate as doxycycline has less than predictable coverage. Arrives in clinic today with swelling over this area under the wound. He also has a new wound on the right fifth metatarsal head 11/18; the patient has a difficult wound on the lateral aspect of the left fifth metatarsal head. The wound was almost ballotable last week I opened it slightly expecting to see purulence however there was just bleeding. I cultured this this was negative. X-ray unchanged. We are trying to get an MRI but I am not sure were going to be able to get this through his insurance. He also has an area on the right lateral fifth metatarsal head this looks healthier 12/3; the patient finally got our MRI. Surprisingly this did not show osteomyelitis. I did show the soft tissue ulceration at the lateral plantar aspect of the fifth metatarsal base with a tiny residual  6 mm abscess overlying the superficial fascia I have tried to culture this area I have not been able to get this to grow anything. Nevertheless the protruding tissue looks aggravated. I suspect we should try to treat the underlying "abscess with broad-spectrum antibiotics. I am going to start him on Levaquin and Flagyl. He has much less edema in his legs and I am going to continue to wrap his legs and see him weekly 12/10. I started Levaquin and Flagyl on him last week. He just picked up the Flagyl apparently there was some delay.  The worry is the wound on the left fifth metatarsal base which is substantial and worsening. His foot looks like he inverts at the ankle making this a weightbearing surface. Certainly no improvement in fact I think the measurements of this are somewhat worse. We have been using 12/17; he apparently just got the Levaquin yesterday this is 2 weeks after the fact. He has completed the Flagyl. The area over the left fifth metatarsal base still has protruding granulation tissue although it does not look quite as bad as it did some weeks ago. He has severe bilateral lymphedema although we have not been treating him for wounds on his legs this is definitely going to require compression. There was so much edema in the left I did not wish to put him in a total contact cast today. I am going to increase his compression from 3-4 layer. The area on the right lateral fifth met head actually look quite good and superficial. 12/23; patient arrived with callus on the right fifth met head and the substantial hyper granulated callused wound on the base of his fifth metatarsal. He says he is completing his Levaquin in 2 days but I do not think that adds up with what I gave him but I will have to double check this. We are using Hydrofera Blue on both areas. My plan is to put the left leg in a cast the week after New Year's 04/06/2020; patient's wounds about the same. Right lateral fifth metatarsal head and left lateral foot over the base of the fifth metatarsal. There is undermining on the left lateral foot which I removed before application of total contact cast continuing with Hydrofera Blue new. Patient tells me he was seen by endocrinology today lab work was done [Dr. Kerr]. Also wondering whether he was referred to cardiology. I went over some lab work from previously does not have chronic renal failure certainly not nephrotic range proteinuria he does have very poorly controlled diabetes but this is not his most  updated lab work. Hemoglobin A1c has been over 11 1/10; the patient had a considerable amount of leakage towards mid part of his left foot with macerated skin however the wound surface looks better the area on the right lateral fifth met head is better as well. I am going to change the dressing on the left foot under the total contact cast to silver alginate, continue with Hydrofera Blue on the right. 1/20; patient was in the total contact cast for 10 days. Considerable amount of drainage although the skin around the wound does not look too bad on the left foot. The area on the right fifth metatarsal head is closed. Our nursing staff reports large amount of drainage out of the left lateral foot wound 1/25; continues with copious amounts of drainage described by our intake staff. PCR culture I did last week showed E. coli and Enterococcus faecalis and low quantities. Multiple resistance  genes documented including extended spectrum beta lactamase, MRSA, MRSE, quinolone, tetracycline. The wound is not quite as good this week as it was 5 days ago but about the same size 2/3; continues with copious amounts of malodorous drainage per our intake nurse. The PCR culture I did 2 weeks ago showed E. coli and low quantities of Enterococcus. There were multiple resistance genes detected. I put Neosporin on him last week although this does not seem to have helped. The wound is slightly deeper today. Offloading continues to be an issue here although with the amount of drainage she has a total contact cast is just not going to work 2/10; moderate amount of drainage. Patient reports he cannot get his stocking on over the dressing. I told him we have to do that the nurse gave him suggestions on how to make this work. The wound is on the bottom and lateral part of his left foot. Is cultured predominantly grew low amounts of Enterococcus, E. coli and anaerobes. There were multiple resistance genes detected including  extended spectrum beta lactamase, quinolone, tetracycline. I could not think of an easy oral combination to address this so for now I am going to do topical antibiotics provided by [facility] I think the main agents here are vancomycin and an aminoglycoside. We have to be able to give him access to the wounds to get the topical antibiotic on 2/17; moderate amount of drainage this is unchanged. He has his Keystone topical antibiotic against the deep tissue culture organisms. He has been using this and changing the dressing daily. Silver alginate on the wound surface. 2/24; using Keystone antibiotic with silver alginate on the top. He had too much drainage for a total contact cast at one point although I think that is improving and I think in the next week or 2 it might be possible to replace a total contact cast I did not do this today. In general the wound surface looks healthy however he continues to have thick rims of skin and subcutaneous tissue around the wide area of the circumference which I debrided 06/04/2020 upon evaluation today patient appears to be doing well in regard to his wound. I do feel like he is showing signs of improvement. There is little bit of callus and dead tissue around the edges of the wound as well as what appears to be a little bit of a sinus tract that is off to the side laterally I would perform debridement to clear that away today. 3/17; left lateral foot. The wound looks about the same as I remember. Not much depth surface looks healthy. No evidence of infection 3/25; left lateral foot. Wound surface looks about the same. Separating epithelium from the circumference. There really is no evidence of infection here however not making progress by my view 3/29; left lateral foot. Surface of the wound again looks reasonably healthy still thick skin and subcutaneous tissue around the wound margins. There is no evidence of infection. One of the concerns being brought up by the  nurses has again the amount of drainage vis--vis continued use of a total contact cast 4/5; left lateral foot at roughly the base of the fifth metatarsal. Nice healthy looking granulated tissue with rims of epithelialization. The overall wound measurements are not any better but the tissue looks healthy. The only concern is the amount of drainage although he has no surrounding maceration with what we have been doing recently to absorb fluid and protect his skin. He also has lymphedema.  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 He states that the kerlix/coban under the TCC rolled down and could not get it back up. He has been using an offloading scooter and has somehow hurt his right foot using this device. This happened last week. He states that the side of his right foot developed a blister and opened. The top of his foot also has a few small open wounds he thinks is due to his socks rubbing in his shoes. He has not been using any dressings to the wound. He denies purulent drainage, fever/chills or erythema to the wounds. 4/22; patient presents for 1 week follow-up. He developed new wounds to the right foot that were evaluated at last clinic visit. He continues to have a total contact cast to the left leg and he reports no issues. He has been using silver collagen to the right foot wounds with no issues. He denies purulent drainage, fever/chills or erythema to the right foot wounds. He has no complaints today 4/25; patient presents for 1 week follow-up. He has a total contact cast of the left leg and reports no issues. He has been using silver alginate to the right foot wound. He denies purulent drainage, fever/chills or erythema to the right foot  wounds. 5/2 patient presents for 1 week follow-up. T contact cast on the left. The wound which is on the base of the plantar foot at the base of the fifth metatarsal otal actually looks quite good and dimensions continue to gradually contract. HOWEVER the area on the right lateral fifth metatarsal head is much larger than what I remember from 2 weeks ago. Once more is he has significant levels of hypergranulation. Noteworthy that he had this same hyper granulated response on his wound on the left foot at one point in time. So much so that he I thought there was an underlying fluid collection. Based on this I think this just needs debridement. 5/9; the wound on the left actually continues to be gradually smaller with a healthy surface. Slight amount of drainage and maceration of the skin around but not too bad. However he has a large wound over the right fifth metatarsal head very much in the same configuration as his left foot wound was initially. I used silver nitrate to address the hyper granulated tissue no mechanical debridement 5/16; area on the left foot did not look as healthy this week deeper thick surrounding macerated skin and subcutaneous tissue. oo The area on the right foot fifth met head was about the same oo The area on the right ankle that we identified last week is completely broken down into an open wound presumably a stocking rubbing issue 5/23; patient has been using a total contact cast to the left side. He has been using silver alginate underneath. He has also been using silver alginate to the right foot wounds. He has no complaints today. He denies any signs of infection. 5/31; the left-sided wound looks some better measure smaller surface granulation looks better. We have been using silver alginate under the total contact cast oo The large area on his right fifth met head and right dorsal foot look about the same still using silver alginate 6/6; neither side is good  as I  was hoping although the surface area dimensions are better. A lot of maceration on his left and right foot around the wound edge. Area on the dorsal right foot looks better. He says he was traveling. I am not sure what does the amount of maceration around the plantar wounds may be drainage issues 6/13; in general the wound surfaces look quite good on both sides. Macerated skin and raised edges around the wound required debridement although in general especially on the left the surface area seems improved. oo The area on the right dorsal ankle is about the same I thought this would not be such a problem to close 6/20; not much change in either wound although the one on the right looks a little better. Both wounds have thick macerated edges to the skin requiring debridements. We have been using silver alginate. The area on the dorsal right ankle is still open I thought this would be closed. 6/28; patient comes in today with a marked deterioration in the right foot wound fifth met head. Wide area of exposed bone this is a drastic change from last time. The area on the left there we have been casting is stagnant. We have been using silver alginate in both wound areas. 7/5; bone culture I did for PCR last time was positive for Pseudomonas, group B strep, Enterococcus and Staph aureus. There was no suggestion of methicillin resistance or ampicillin resistant genes. This was resistant to tetracycline however He comes into the clinic today with the area over his right plantar fifth metatarsal head which had been doing so well 2 weeks ago completely necrotic feeling bone. I do not know that this is going to be salvageable. The left foot wound is certainly no smaller but it has a better surface and is superficial. 7/8; patient called in this morning to say that his total contact cast was rubbing against his foot. He states he is doing fine overall. He denies signs of infection. 7/12; continued deterioration  in the wound over the right fifth metatarsal head crumbling bone. This is not going to be salvageable. The patient agrees and wants to be referred to Dr. Doran Durand which we will attempt to arrange as soon as possible. I am going to continue him on antibiotics as long as that takes so I will renew those today. The area on the left foot which is the base of the fifth metatarsal continues to look somewhat better. Healthy looking tissue no depth no debridement is necessary here. 7/20; the patient was kindly seen by Dr. Doran Durand of orthopedics on 10/19/2020. 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 Towards this end we have taken him out of the total contact cast on the left we will put him in a wrap with Hydrofera Blue. As I understand things surgery is planned for 7/21 7/27; patient had his surgery last Thursday. 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 Apparently everything went well we did not still disturb that today The area on the left foot actually looks quite good. He has been much less mobile which probably explains this he did not seem to do well in the total contact cast secondary to drainage and maceration I think. 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 Fortunately there  is no evidence of active infection at this time. No fevers, chills, nausea, vomiting, or diarrhea. Overall I think that he is actually doing extremely well. Nonetheless I do believe that he is staying off of this more following the surgery in his right foot that is the reason the left is doing so great. 8/16; left plantar foot wound. This looks smaller than the last time I saw this he is using Hydrofera Blue. The surgical wound on the right foot is being followed by Dr. Doran Durand we did not look at this today. He has surgical  shoes on both feet 8/23; left plantar foot wound not as good this week. Surrounding macerated skin and subcutaneous tissue everything looks moist and wet. I do not think he is offloading this adequately. He is using a surgical shoe Apparently the right foot surgical wound is not open although I did not check his foot 8/31; left plantar foot lateral aspect. Much improved this week. He has no maceration. Some improvement in the surface area of the wound but most impressively the depth is come in we are using silver alginate. The patient is a Product/process development scientist. He is asked that we write him a letter so he can go back to work. I have also tried to see if we can write something that will allow him to limit the amount of time that he is on his foot at work. 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 Hopefully the latter can be avoided Objective Constitutional Patient is hypertensive.. Pulse regular and within target range for patient.Marland Kitchen Respirations regular, non-labored and within target range.. Temperature is normal and within the target range for the patient.Marland Kitchen Appears in no distress. Vitals Time Taken: 7:44 AM, Height: 77 in, Weight: 280 lbs, BMI: 33.2, Temperature: 98.7 F, Pulse: 93 bpm, Respiratory Rate: 17 breaths/min, Blood Pressure: 147/95 mmHg, Capillary Blood Glucose: 130 mg/dl. General Notes: Wound exam; left plantar foot at the base of the fifth metatarsal. The maceration that we noted last week is better. Surface of the wound looks better after last week's debridement. There is no evidence of surrounding infection. Impressively the wound depth is come in much better. Integumentary (Hair, Skin) Wound #3 status is Open. Original cause of wound was Trauma. The date acquired was: 10/02/2019. The wound has been in treatment 57 weeks. The wound is located on the Left,Lateral Foot. The wound measures 2.2cm length x  3cm width x 0.3cm depth; 5.184cm^2 area and 1.555cm^3 volume. There is Fat Layer (Subcutaneous Tissue) exposed. There is no tunneling noted, however, there is undermining starting at 7:00 and ending at 11:00 with a maximum distance of 0.6cm. There is a large amount of serosanguineous drainage noted. The wound margin is well defined and not attached to the wound base. There is large (67- 100%) red granulation within the wound bed. There is no necrotic tissue within the wound bed. 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 - with Dr. Dellia Nims Edema Control - Lymphedema / SCD / Other: Elevate legs to the level of the heart or above for 30 minutes daily and/or when sitting, a frequency of: - throughout the day Avoid standing for long periods of time. Exercise regularly Moisturize legs daily. - right leg every night before bed. Compression stocking  or Garment 20-30 mm/Hg pressure to: - Apply to right leg in the morning and remove at night. Off-Loading: Open toe surgical shoe to: - bilateral feet WOUND #3: - Foot Wound Laterality: Left, Lateral Cleanser: Soap and Water Every Other Day/30 Days Discharge Instructions: May shower and wash wound with dial antibacterial soap and water prior to dressing change. Cleanser: Wound Cleanser (DME) (Generic) Every Other Day/30 Days Discharge Instructions: Cleanse the wound with wound cleanser prior to applying a clean dressing using gauze sponges, not tissue or cotton balls. Peri-Wound Care: Zinc Oxide Ointment 30g tube Every Other Day/30 Days Discharge Instructions: Apply Zinc Oxide to periwound with each dressing change as needed. Prim Dressing: KerraCel Ag Gelling Fiber Dressing, 4x5 in (silver alginate) (Generic) Every Other Day/30 Days ary Discharge Instructions: Apply silver  alginate to wound bed as instructed Secondary Dressing: Woven Gauze Sponge, Non-Sterile 4x4 in (Generic) Every Other Day/30 Days Discharge Instructions: Apply over primary dressing as directed. Secondary Dressing: Zetuvit Plus 4x8 in (Generic) Every Other Day/30 Days Discharge Instructions: Apply over primary dressing as directed. Secondary Dressing: Optifoam Non-Adhesive Dressing, 4x4 in (Generic) Every Other Day/30 Days Discharge Instructions: Apply over primary dressing as directed. Secured With: Coban Self-Adherent Wrap 4x5 (in/yd) (Generic) Every Other Day/30 Days Discharge Instructions: Secure with Coban as directed. Secured With: [employer], 4.5x3.1 (in/yd) (DME) (Generic) Every Other Day/30 Days Discharge Instructions: Secure with Kerlix as directed. Secured With: 82M Medipore H Soft Cloth Surgical T 4 x 2 (in/yd) (DME) (Generic) Every Other Day/30 Days ape Discharge Instructions: Secure dressing with tape as directed. #1 the patient's wound surface and the overall wound volume look better this week. 2. I have continued silver alginate 3. I was prepared to do a PCR culture of deep tissue however given the improvement this week I have decided to put this off for another week to see if we see continued improvement 4. I see no reason for systemic antibiotics 5. Somewhat surprisingly no need for surface debridement this week. Visually the granulation looks healthy and while this is sometimes misleading, we will follow this carefully into the next week to see if we have continued improvement Electronic Signature(s) Signed: 12/02/2020 4:28:25 PM By: [name] MD Entered By: [name] on 12/02/2020 08:22:34 -------------------------------------------------------------------------------- SuperBill Details Patient Name: Date of Service: [name], [name] 12/02/2020 Medical Record Number: [id number] Patient Account Number: [id number] Date of Birth/Sex: Treating  RN: [birth year] (35 y.o. [name] Primary Care Provider: [name] Other Clinician: Referring Provider: Treating Provider/Extender: [name] in Treatment: 57 Diagnosis Coding ICD-10 Codes Code Description E11.621 Type 2 diabetes mellitus with foot ulcer L97.528 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ity Facility Procedures CPT4 Code: [id number] Description: 99213 - WOUND CARE VISIT-LEV 3 EST PT Modifier: Quantity: 1 Physician Procedures Electronic Signature(s) Signed: 12/02/2020 4:28:25 PM By: [name] MD Signed: 12/03/2020 4:23:50 PM By: Rhae Hammock RN Entered By: Rhae Hammock on 12/02/2020 11:58:24

## 2020-12-08 ENCOUNTER — Encounter (HOSPITAL_BASED_OUTPATIENT_CLINIC_OR_DEPARTMENT_OTHER): Payer: BC Managed Care – PPO | Attending: Internal Medicine | Admitting: Internal Medicine

## 2020-12-15 ENCOUNTER — Encounter (HOSPITAL_BASED_OUTPATIENT_CLINIC_OR_DEPARTMENT_OTHER): Payer: BC Managed Care – PPO | Attending: Internal Medicine | Admitting: Internal Medicine

## 2020-12-15 ENCOUNTER — Other Ambulatory Visit: Payer: Self-pay

## 2020-12-15 DIAGNOSIS — I890 Lymphedema, not elsewhere classified: Secondary | ICD-10-CM | POA: Insufficient documentation

## 2020-12-15 DIAGNOSIS — E11621 Type 2 diabetes mellitus with foot ulcer: Secondary | ICD-10-CM | POA: Insufficient documentation

## 2020-12-15 DIAGNOSIS — L97528 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ity: Secondary | ICD-10-CM | POA: Insufficient documentation

## 2020-12-15 DIAGNOSIS — E1151 Type 2 diabetes mellitus with diabetic peripheral angiopathy without gangrene: Secondary | ICD-10-CM | POA: Diagnosis not present

## 2020-12-16 NOTE — Progress Notes (Signed)
[place], [name] ([id number]) Visit Report for 12/15/2020 Arrival Information Details Patient Name: Date of Service: [name] 12/15/2020 7:30 A M Medical Record Number: [id number] Patient Account Number: [id number] Date of Birth/Sex: Treating RN: 04/23/1985 (35 y.o. [name] Primary Care Roseline Ebarb: [name] Other Clinician: Referring Sherlyne Crownover: Treating Chapel Silverthorn/Extender: [name] in Treatment: 60 Visit Information History Since Last Visit Added or deleted any medications: No Patient Arrived: Ambulatory Any new allergies or adverse reactions: No Arrival Time: 07:48 Had a fall or experienced change in No Transfer Assistance: None activities of daily living that may affect Patient Identification Verified: Yes risk of falls: Secondary Verification Process Completed: Yes Signs or symptoms of abuse/neglect since last visito No Patient Requires Transmission-Based Precautions: No Hospitalized since last visit: No Patient Has Alerts: No Implantable device outside of the clinic excluding No cellular tissue based products placed in the center since last visit: Has Dressing in Place as Prescribed: Yes Pain Present Now: No Electronic Signature(s) Signed: 12/15/2020 4:34:23 PM By: [name] Entered By: [name] on 12/15/2020 07:50:23 -------------------------------------------------------------------------------- Encounter Discharge Information Details Patient Name: Date of Service: [name], [name] 12/15/2020 7:30 A M Medical Record Number: [id number] Patient Account Number: [id number] Date of Birth/Sex: Treating RN: [birth year] (35 y.o. [name] Primary Care Doneshia Hill: [name] Other Clinician: Referring Karimah Winquist: Treating Weslie Rasmus/Extender: [name] in Treatment: 44 Encounter Discharge Information Items Post Procedure Vitals Discharge Condition: Stable Temperature (F):  98.4 Ambulatory Status: Ambulatory Pulse (bpm): 75 Discharge Destination: Home Respiratory Rate (breaths/min): 16 Transportation: Private Auto Blood Pressure (mmHg): 133/88 Schedule Follow-up Appointment: Yes Clinical Summary of Care: Provided on 12/15/2020 Form Type Recipient Paper Patient Patient Electronic Signature(s) Signed: 12/15/2020 9:11:12 AM By: [name] Entered By: [name] on 12/15/2020 09:11:12 -------------------------------------------------------------------------------- Lower Extremity Assessment Details Patient Name: Date of Service: [name] 12/15/2020 7:30 A M Medical Record Number: [id number] Patient Account Number: [id number] Date of Birth/Sex: Treating RN: [birth year] (35 y.o. [name] Primary Care Rose Hippler: [name] Other Clinician: Referring Kennah Hehr: Treating Luzelena Heeg/Extender: [name] in Treatment: 59 Edema Assessment Assessed: [name]: Yes] [name]: No] Edema: [Left: Ye] [Right: s] Calf Left: Right: Point of Measurement: 48 cm From Medial Instep 47.3 cm Ankle Left: Right: Point of Measurement: 11 cm From Medial Instep 27 cm Vascular Assessment Pulses: Dorsalis Pedis Palpable: [Left:Yes] Electronic Signature(s) Signed: 12/15/2020 4:34:23 PM By: [name] Entered By: [name] on 12/15/2020 07:58:11 -------------------------------------------------------------------------------- Multi Wound Chart Details Patient Name: Date of Service: [name], [name] 12/15/2020 7:30 A M Medical Record Number: [id number] Patient Account Number: [id number] Date of Birth/Sex: Treating RN: [birth year] (35 y.o. [name], [name] Primary Care Jacquelyn Antony: [name] Other Clinician: Referring Moises Terpstra: Treating Ashad Fawbush/Extender: [name] in Treatment: 59 Vital Signs Height(in): 77 Capillary Blood Glucose(mg/dl): 60 Weight(lbs): 280 Pulse(bpm): 26 Body  Mass Index(BMI): 33 Blood Pressure(mmHg): 133/88 Temperature(F): Respiratory Rate(breaths/min): 16 Photos: [3:Left, Lateral Foot] [N/A:N/A N/A] Wound Location: [3:Trauma] [N/A:N/A] Wounding Event: [3:Diabetic Wound/Ulcer of the Lower] [N/A:N/A] Primary Etiology: [3:Extremity Type II Diabetes] [N/A:N/A] Comorbid History: [3:10/02/2019] [N/A:N/A] Date Acquired: [3:59] [N/A:N/A] Weeks of Treatment: [3:Open] [N/A:N/A] Wound Status: [3:1.8x2.6x0.3] [N/A:N/A] Measurements L x W x [name] [3:3.676] [N/A:N/A] A (cm) : rea [3:1.103] [N/A:N/A] Volume (cm) : [3:-122.90%] [N/A:N/A] % Reduction in A [3:rea: -568.50%] [N/A:N/A] % Reduction in Volume: [3:7] Starting Position 1 (o'clock): [3:11] Ending Position 1 (o'clock): [3:0.7] Maximum Distance 1 (cm): [3:Yes] [N/A:N/A] Undermining: [  3:Grade 2] [N/A:N/A] Classification: [3:Large] [N/A:N/A] Exudate A mount: [3:Serosanguineous] [N/A:N/A] Exudate Type: [3:red, brown] [N/A:N/A] Exudate Color: [3:Well defined, not attached] [N/A:N/A] Wound Margin: [3:Large (67-100%)] [N/A:N/A] Granulation A mount: [3:Red] [N/A:N/A] Granulation Quality: [3:None Present (0%)] [N/A:N/A] Necrotic A mount: [3:Fat Layer (Subcutaneous Tissue): Yes N/A] Exposed Structures: [3:Fascia: No Tendon: No Muscle: No Joint: No Bone: No Small (1-33%)] [N/A:N/A] Epithelialization: [3:Debridement - Excisional] [N/A:N/A] Debridement: Pre-procedure Verification/Time Out 08:15 [N/A:N/A] Taken: [3:Other] [N/A:N/A] Pain Control: [3:Callus, Subcutaneous] [N/A:N/A] Tissue Debrided: [3:Skin/Subcutaneous Tissue] [N/A:N/A] Level: [3:2] [N/A:N/A] Debridement A (sq cm): [3:rea Curette, Forceps, Scissors] [N/A:N/A] Instrument: [3:Moderate] [N/A:N/A] Bleeding: [3:Silver Nitrate] [N/A:N/A] Hemostasis A chieved: [3:Procedure was tolerated well] [N/A:N/A] Debridement Treatment Response: [3:3.3x4x0.3] [N/A:N/A] Post Debridement Measurements L x W x [name] [3:3.11] [N/A:N/A] Post  Debridement Volume: (cm) [3:Debridement] [N/A:N/A] Treatment Notes Electronic Signature(s) Signed: 12/15/2020 5:15:03 PM By: Rhae Hammock RN Signed: 12/16/2020 3:40:47 PM By: [name] MD Entered By: [name] on 12/15/2020 08:32:37 -------------------------------------------------------------------------------- Multi-Disciplinary Care Plan Details Patient Name: Date of Service: [name], [name] 12/15/2020 7:30 A M Medical Record Number: [id number] Patient Account Number: [id number] Date of Birth/Sex: Treating RN: [birth year] (35 y.o. [name] Primary Care Yohana Bartha: [name] Other Clinician: Referring Tesean Stump: Treating Shanya Ferriss/Extender: [name] in Treatment: 30 Multidisciplinary Care Plan reviewed with physician Active Inactive Nutrition Nursing Diagnoses: Imbalanced nutrition Potential for alteratiion in Nutrition/Potential for imbalanced nutrition Goals: Patient/caregiver agrees to and verbalizes understanding of need to use nutritional supplements and/or vitamins as prescribed Date Initiated: 10/24/2019 Date Inactivated: 04/06/2020 Target Resolution Date: 04/03/2020 Goal Status: Met Patient/caregiver will maintain therapeutic glucose control Date Initiated: 10/24/2019 Target Resolution Date: 12/25/2020 Goal Status: Active Interventions: Assess HgA1c results as ordered upon admission and as needed Assess patient nutrition upon admission and as needed per policy Provide education on elevated blood sugars and impact on wound healing Provide education on nutrition Treatment Activities: Education provided on Nutrition : 12/02/2020 Notes: 11/17/20: Glucose control ongoing issue, target date extended. Wound/Skin Impairment Nursing Diagnoses: Impaired tissue integrity Knowledge deficit related to ulceration/compromised skin integrity Goals: Patient/caregiver will verbalize understanding of skin care regimen Date  Initiated: 10/24/2019 Target Resolution Date: 12/21/2020 Goal Status: Active Ulcer/skin breakdown will have a volume reduction of 30% by week 4 Date Initiated: 10/24/2019 Date Inactivated: 01/16/2020 Target Resolution Date: 01/10/2020 Unmet Reason: no change in Goal Status: Unmet measurements. Interventions: Assess patient/caregiver ability to obtain necessary supplies Assess patient/caregiver ability to perform ulcer/skin care regimen upon admission and as needed Assess ulceration(s) every visit Provide education on ulcer and skin care Notes: 11/17/20: Wound care regimen continues Electronic Signature(s) Signed: 12/15/2020 4:34:23 PM By: [name] Entered By: [name] on 12/15/2020 07:58:39 -------------------------------------------------------------------------------- Pain Assessment Details Patient Name: Date of Service: [name], [name] 12/15/2020 7:30 A M Medical Record Number: [id number] Patient Account Number: [id number] Date of Birth/Sex: Treating RN: 1985-09-14 (36 y.o. [name] Primary Care Jamesmichael Shadd: [name] Other Clinician: Referring Maigen Mozingo: Treating Adelise Buswell/Extender: [name] in Treatment: 59 Active Problems Location of Pain Severity and Description of Pain Patient Has Paino No Site Locations Pain Management and Medication Current Pain Management: Electronic Signature(s) Signed: 12/15/2020 4:34:23 PM By: [name] Entered By: [name] on 12/15/2020 07:54:22 -------------------------------------------------------------------------------- Patient/Caregiver Education Details Patient Name: Date of Service: [name], [name] 9/13/2022andnbsp7:30 A M Medical Record Number: [id number] Patient Account Number: [id number] Date of Birth/Gender: Treating RN: 04/10/1985 (35 y.o. [name] Primary Care Physician: [name] Other Clinician: Referring Physician:  Treating Physician/Extender:  [name] in Treatment: 59 Education Assessment Education Provided To: Patient Education Topics Provided Offloading: Methods: Explain/Verbal, Printed Responses: State content correctly Wound/Skin Impairment: Methods: Demonstration, Explain/Verbal, Printed Responses: State content correctly Electronic Signature(s) Signed: 12/15/2020 4:34:23 PM By: [name] Entered By: [name] on 12/15/2020 07:59:11 -------------------------------------------------------------------------------- Wound Assessment Details Patient Name: Date of Service: [name], [name] 12/15/2020 7:30 A M Medical Record Number: [id number] Patient Account Number: [id number] Date of Birth/Sex: Treating RN: October 09, 1985 (35 y.o. [name] Primary Care Zaia Carre: [name] Other Clinician: Referring Eamonn Sermeno: Treating Mary-Ann Pennella/Extender: [name] in Treatment: 59 Wound Status Wound Number: 3 Primary Etiology: Diabetic Wound/Ulcer of the Lower Extremity Wound Location: Left, Lateral Foot Wound Status: Open Wounding Event: Trauma Comorbid History: Type II Diabetes Date Acquired: 10/02/2019 Weeks Of Treatment: 59 Clustered Wound: No Photos Wound Measurements Length: (cm) 1.8 Width: (cm) 2.6 Depth: (cm) 0.3 Area: (cm) 3.676 Volume: (cm) 1.103 % Reduction in Area: -122.9% % Reduction in Volume: -568.5% Epithelialization: Small (1-33%) Tunneling: No Undermining: Yes Starting Position (o'clock): 7 Ending Position (o'clock): 11 Maximum Distance: (cm) 0.7 Wound Description Classification: Grade 2 Wound Margin: Well defined, not attached Exudate Amount: Large Exudate Type: Serosanguineous Exudate Color: red, brown Foul Odor After Cleansing: No Slough/Fibrino No Wound Bed Granulation Amount: Large (67-100%) Exposed Structure Granulation Quality: Red Fascia Exposed: No Necrotic Amount: None Present (0%) Fat Layer (Subcutaneous  Tissue) Exposed: Yes Tendon Exposed: No Muscle Exposed: No Joint Exposed: No Bone Exposed: No Treatment Notes Wound #3 (Foot) Wound Laterality: Left, Lateral Cleanser Soap and Water Discharge Instruction: May shower and wash wound with dial antibacterial soap and water prior to dressing change. Wound Cleanser Discharge Instruction: Cleanse the wound with wound cleanser prior to applying a clean dressing using gauze sponges, not tissue or cotton balls. Peri-Wound Care Zinc Oxide Ointment 30g tube Discharge Instruction: Apply Zinc Oxide to periwound with each dressing change as needed. Topical Primary Dressing KerraCel Ag Gelling Fiber Dressing, 4x5 in (silver alginate) Discharge Instruction: Apply silver alginate to wound bed as instructed Secondary Dressing Woven Gauze Sponge, Non-Sterile 4x4 in Discharge Instruction: Apply over primary dressing as directed. Zetuvit Plus 4x8 in Discharge Instruction: Apply over primary dressing as directed. Optifoam Non-Adhesive Dressing, 4x4 in Discharge Instruction: Apply over primary dressing as directed. Secured With [employer] 4x5 (in/yd) Discharge Instruction: Secure with Coban as directed. Kerlix Roll Sterile, 4.5x3.1 (in/yd) Discharge Instruction: Secure with Kerlix as directed. 24M Medipore H Soft Cloth Surgical T 4 x 2 (in/yd) ape Discharge Instruction: Secure dressing with tape as directed. Compression Wrap Compression Stockings Add-Ons Electronic Signature(s) Signed: 12/15/2020 3:44:59 PM By: [name] Previous Signature: 12/15/2020 3:43:57 PM Version By: [name] Entered By: [name] on 12/15/2020 15:44:58 -------------------------------------------------------------------------------- Vitals Details Patient Name: Date of Service: [name], [name] 12/15/2020 7:30 A M Medical Record Number: [id number] Patient Account Number: [id number] Date of Birth/Sex: Treating RN: [birth year] (35 y.o. [name] Primary Care Eveline Sauve: [name] Other Clinician: Referring Trenee Igoe: Treating Taevon Aschoff/Extender: [name] in Treatment: 59 Vital Signs Time Taken: 07:50 Temperature (F): 98.4 Height (in): 77 Pulse (bpm): 75 Weight (lbs): 280 Respiratory Rate (breaths/min): 16 Body Mass Index (BMI): 33.2 Blood Pressure (mmHg): 133/88 Capillary Blood Glucose (mg/dl): 60 Reference Range: 80 - 120 mg / dl Electronic Signature(s) Signed: 12/15/2020 9:10:26 AM By: [name] Entered By: [name] on 12/15/2020 09:10:26

## 2020-12-16 NOTE — Progress Notes (Signed)
[place], [name] ([id number]) Visit Report for 12/15/2020 Debridement Details Patient Name: Date of Service: [name] 12/15/2020 7:30 A M Medical Record Number: [id number] Patient Account Number: [id number] Date of Birth/Sex: Treating RN: 1985/10/20 (35 y.o. [name] Primary Care Provider: [name] Other Clinician: Referring Provider: Treating Provider/Extender: [name] in Treatment: 59 Debridement Performed for Assessment: Wound #3 Left,Lateral Foot Performed By: Physician [name], MD Debridement Type: Debridement Severity of Tissue Pre Debridement: Fat layer exposed Level of Consciousness (Pre-procedure): Awake and Alert Pre-procedure Verification/Time Out Yes - 08:15 Taken: Start Time: 08:16 Pain Control: Other : Benzocaine T Area Debrided (L x W): otal 1 (cm) x 2 (cm) = 2 (cm) Tissue and other material debrided: Non-Viable, Callus, Subcutaneous Level: Skin/Subcutaneous Tissue Debridement Description: Excisional Instrument: Curette, Forceps, Scissors Specimen: Swab, Number of Specimens T aken: 1 Bleeding: Moderate Hemostasis Achieved: Silver Nitrate End Time: 08:20 Response to Treatment: Procedure was tolerated well Level of Consciousness (Post- Awake and Alert procedure): Post Debridement Measurements of Total Wound Length: (cm) 3.3 Width: (cm) 4 Depth: (cm) 0.3 Volume: (cm) 3.11 Character of Wound/Ulcer Post Debridement: Stable Severity of Tissue Post Debridement: Fat layer exposed Post Procedure Diagnosis Same as Pre-procedure Electronic Signature(s) Signed: 12/15/2020 9:06:48 AM By: [name] Signed: 12/16/2020 3:40:47 PM By: [name] MD Entered By: [name] on 12/15/2020 09:06:48 -------------------------------------------------------------------------------- HPI Details Patient Name: Date of Service: [name], [name] 12/15/2020 7:30 A M Medical Record Number: [id number] Patient  Account Number: [id number] Date of Birth/Sex: Treating RN: 1986-01-04 (35 y.o. 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 He comes in for review of a wound over the plantar fifth metatarsal head extending into the lateral part of the foot. He was followed for this previously by his podiatrist Dr. Cornelius Moras. As the patient tells his story he went to see podiatry first for a swelling he developed on the lateral part of his fifth metatarsal head in May. He states this was "open" by podiatry and the area closed. He was followed up in June and it was again opened callus removed and it closed promptly. There were plans being made for surgery on the fifth metatarsal head in June however his blood sugar was apparently too high for anesthesia. Apparently the area was debrided and opened again in June and it is never closed since. Looking over the records from podiatry I am really not able to follow this. It was clear when he was first seen it was before 5/14 at that point he already had a wound. By 5/17 the ulcer was resolved. I do not see anything about a procedure. On 5/28 noted to have pre-ulcerative moderate keratosis. X-ray noted 1/5 contracted toe and tailor's bunion and metatarsal deformity. On a visit date on 09/28/2018 the dorsal part of the left foot it healed and resolved. There was concern about swelling in his lower extremity he was sent to the ER.. As far as I can tell he was seen in the ER on 7/12 with an ulcer on his left foot. A DVT rule out of the left leg was negative. I do not think I have complete records from podiatry but I am not able to verify the procedures this patient states he had. He states  after the  last procedure the wound has never closed although I am not able to follow this in the records I have from podiatry. He has not had a recent x-ray The patient has been using Neosporin on the wound. He is wearing a Darco shoe. He is still very active up on his foot working and exercising. Past medical history; type 2 diabetes ketosis-prone, leg swelling with a negative DVT study in July. Non-smoker ABI in our clinic was 0.85 on the left 10/16; substantial wound on the plantar left fifth met head extending laterally almost to the dorsal fifth MTP. We have been using silver alginate we gave him a Darco forefoot off loader. An x-ray did not show evidence of osteomyelitis did note soft tissue emphysema which I think was due to gas tracking through an open wound. There is no doubt in my mind he requires an MRI 10/23; MRI not booked until 3 November at the earliest this is largely due to his glucose sensor in the right arm. We have been using silver alginate. There has been an improvement 10/29; I am still not exactly sure when his MRI is booked for. He says it is the third but it is the 10th in epic. This definitely needs to be done. He is running a low-grade fever today but no other symptoms. No real improvement in the 1 02/26/2019 patient presents today for a follow-up visit here in our clinic he is last been seen in the clinic on October 29. Subsequently we were working on getting MRI to evaluate and see what exactly was going on and where we would need to go from the standpoint of whether or not he had osteomyelitis and again what treatments were going be required. Subsequently the patient ended up being admitted to the hospital on 02/07/2019 and was discharged on 02/14/2019. This is a somewhat interesting admission with a discharge diagnosis of pneumonia due to COVID-19 although he was positive for COVID-19 when tested at the urgent care but negative x2 when he was actually in the hospital. With that  being said he did have acute respiratory failure with hypoxia and it was noted he also have a left foot ulceration with osteomyelitis. With that being said he did require oxygen for his pneumonia and I level 4 L. He was placed on antivirals and steroids for the COVID-19. He was also transferred to the [place] at one point. Nonetheless he did subsequently discharged home and since being home has done much better in that regard. The CT angiogram did not show any pulmonary embolism. With regard to the osteomyelitis the patient was placed on vancomycin and Zosyn while in the hospital but has been changed to Augmentin at discharge. It was also recommended that he follow- up with wound care and podiatry. Podiatry however wanted him to see [name] according to the patient prior to them doing anything further. His hemoglobin A1c was 9.9 as noted in the hospital. Have an MRI of the left foot performed while in the hospital on 02/04/2019. 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 There is an overlying plantar open wound noted an abscess tracking back along the lateral aspect of the fifth metatarsal shaft. There is otherwise diffuse cellulitis and mild fasciitis without findings of polymyositis. The patient did have recently pneumonia secondary to COVID-19 I looked in the chart through epic and it does appear that the patient may  need to have an additional x-ray just to ensure everything is cleared and that he has no airspace disease prior to putting him into the [name]. 03/05/2019; patient was readmitted to the clinic last week. He was hospitalized twice for a viral upper respiratory tract infection from 11/1 through 11/4 and then 11/5 through 11/12 ultimately this turned out to be Covid pneumonitis. Although he was discharged on oxygen he is not using it. He says he feels fine. He has no exercise limitation no  cough no sputum. His O2 sat in our clinic today was 100% on room air. 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 He received Vanco and Zosyn in the hospital and then was discharged on 2 weeks of Augmentin. I do not see any relevant cultures. He was supposed to follow-up with infectious disease but I do not see that he has an appointment. 12/8; patient saw Dr. Novella Olive of infectious disease last week. He felt that he had had adequate antibiotic therapy. He did not go to follow-up with Dr. Amalia Hailey of podiatry and I have again talked to him about the pros and cons of this. He does not want to consider a ray amputation of this time. He is aware of the risks of recurrence, migration etc. He started HBO today and tolerated this well. He can complete the Augmentin that I gave him last week. I have looked over the lab work that Dr. Chana Bode ordered his C-reactive protein was 3.3 and his sedimentation rate was 17. The C-reactive protein is never really been measurably that high in this patient 12/15; not much change in the wound today however he has undermining along the lateral part of the foot again more extensively than last week. He has some rims of epithelialization. We have been using silver alginate. He is undergoing hyperbarics but did not dive today 12/18; in for his obligatory first total contact cast change. Unfortunately there was pus coming from the undermining area around his fifth metatarsal head. This was cultured but will preclude reapplication of a cast. He is seen in conjunction with HBO 12/24; patient had staph lugdunensis in the wound in the undermining area laterally last time. We put him on doxycycline which should have covered this. The wound looks better today. I am going to give him another week of doxycycline before reattempting the total contact cast 12/31; the patient is  completing antibiotics. Hemorrhagic debris in the distal part of the wound with some undermining distally. He also had hyper granulation. Extensive debridement with a #5 curette. The infected area that was on the lateral part of the fifth met head is closed over. I do not think he needs any more antibiotics. Patient was seen prior to HBO. Preparations for a total contact cast were made in the cast will be placed post hyperbarics 04/11/19; once again the patient arrives today without complaint. He had been in a cast all week noted that he had heavy drainage this week. This resulted in large raised areas of macerated tissue around the wound 1/14; wound bed looks better slightly smaller. Hydrofera Blue has been changing himself. He had a heavy drainage last week which caused a lot of maceration around the wound so I took him out of a total contact cast he says the drainage is actually better this week He is seen today in conjunction with HBO 1/21; returns to clinic. He was  up in [place] for a day or 2 attending a funeral. He comes back in with the wound larger and with a large area of exposed bone. He had osteomyelitis and septic arthritis of the fifth left metatarsal head while he was in hospital. He received IV antibiotics in the hospital for a prolonged period of time then 3 weeks of Augmentin. Subsequently I gave him 2 weeks of doxycycline for more superficial wound infection. When I saw this last week the wound was smaller the surface of the wound looks satisfactory. 1/28; patient missed hyperbarics today. Bone biopsy I did last time showed Enterococcus faecalis and Staphylococcus lugdunensis . He has a wide area of exposed bone. We are going to use silver alginate as of today. I had another ethical discussion with the patient. 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 In this situation I think the likelihood of healing this is low. Therefore I have recommended a  ray amputation and with the patient's agreement I have referred him to Dr. Doran Durand. The other issue is that his compliance with hyperbarics has been minimal because of his work schedule and given his underlying decision I am going to stop this today READMISSION 10/24/2019 MRI 09/29/2019 left foot IMPRESSION: 1. Apparent skin ulceration inferior and lateral to the 5th metatarsal base with underlying heterogeneous T2 signal and enhancement in the subcutaneous fat. Small peripherally enhancing fluid collections along the plantar and lateral aspects of the 5th metatarsal base suspicious for abscesses. 2. Interval amputation through the mid 5th metatarsal with nonspecific low-level marrow edema and enhancement. Given the proximity to the adjacent soft tissue inflammatory changes, osteomyelitis cannot be excluded. 3. The additional bones appear unremarkable. MRI 09/29/2019 right foot IMPRESSION: 1. Soft tissue ulceration lateral to the 5th MTP joint. There is low-level T2 hyperintensity within the 4th and 5th metatarsal heads and adjacent proximal phalanges without abnormal T1 signal or cortical destruction. These findings are nonspecific and could be seen with early marrow edema, hyperemia or early osteomyelitis. No evidence of septic joint. 2. Mild tenosynovitis and synovial enhancement associated with the extensor digitorum tendons at the level of the midfoot. 3. Diffuse low-level muscular T2 hyperintensity and enhancement, most consistent with diabetic myopathy. LEFT FOOT BONE Methicillin resistant staphylococcus aureus Staphylococcus lugdunensis MIC MIC CIPROFLOXACIN >=8 RESISTANT Resistant <=0.5 SENSI... Sensitive CLINDAMYCIN <=0.25 SENS... Sensitive >=8 RESISTANT Resistant ERYTHROMYCIN >=8 RESISTANT Resistant >=8 RESISTANT Resistant GENTAMICIN <=0.5 SENSI... Sensitive <=0.5 SENSI... Sensitive Inducible Clindamycin NEGATIVE Sensitive NEGATIVE Sensitive OXACILLIN >=4 RESISTANT  Resistant 2 SENSITIVE Sensitive RIFAMPIN <=0.5 SENSI... Sensitive <=0.5 SENSI... Sensitive TETRACYCLINE <=1 SENSITIVE Sensitive <=1 SENSITIVE Sensitive TRIMETH/SULFA <=10 SENSIT Sensitive <=10 SENSIT Sensitive ... Marland Kitchen.. VANCOMYCIN 1 SENSITIVE Sensitive <=0.5 SENSI... Sensitive Right foot bone . Component 3 wk ago Specimen Description BONE Special Requests RIGHT 4 METATARSAL SAMPLE B Gram Stain NO WBC SEEN NO ORGANISMS SEEN Culture RARE METHICILLIN RESISTANT STAPHYLOCOCCUS AUREUS NO ANAEROBES ISOLATED Performed at [facility], [place] [address] [place], [place] [address] Report Status 10/08/2019 FINAL Organism ID, Bacteria METHICILLIN RESISTANT STAPHYLOCOCCUS AUREUS Resulting Agency [facility] Susceptibility Methicillin resistant staphylococcus aureus MIC CIPROFLOXACIN >=8 RESISTANT Resistant CLINDAMYCIN <=0.25 SENS... Sensitive ERYTHROMYCIN >=8 RESISTANT Resistant GENTAMICIN <=0.5 SENSI... Sensitive Inducible Clindamycin NEGATIVE Sensitive OXACILLIN >=4 RESISTANT Resistant RIFAMPIN <=0.5 SENSI... Sensitive TETRACYCLINE <=1 SENSITIVE Sensitive TRIMETH/SULFA <=10 SENSIT Sensitive ... VANCOMYCIN 1 SENSITIVE Sensitive This is a patient we had in clinic earlier this year with a wound over his left fifth metatarsal head. 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 Doran Durand on 05/16/2019. He seems to have developed problems with wounds on his bilateral feet in June 2021 although he may have had problems earlier than this. He was in an urgent care with a right foot ulcer on 09/26/2019 and given a course of doxycycline. This was apparently after having trouble getting into see orthopedics. He was seen by  podiatry on 09/28/2019 noted to have bilateral lower extremity ulcers including the left lateral fifth metatarsal base and the right subfifth met head. It was noted that had purulent drainage at that time. He required hospitalization from 6/20 through 7/2. This was because of worsening right foot wounds. He underwent bilateral operative incision and drainage and bone biopsies bilaterally. Culture results are listed above. He has been referred back to clinic by Dr. Jacqualyn Posey of podiatry. He is also followed by Dr. Megan Salon who saw him yesterday. He was discharged from hospital on Zyvox Flagyl and Levaquin and yesterday changed to doxycycline Flagyl and Levaquin. His inflammatory markers on 6/26 showed a sedimentation rate of 129 and a C-reactive protein of 5. This is improved to 14 and 1.3 respectively. This would indicate improvement. ABIs in our clinic today were 1.23 on the right and 1.20 on the left 11/01/2019 on evaluation today patient appears to be doing fairly well in regard to the wounds on his feet at this point. Fortunately there is no signs of active infection at this time. No fevers, chills, nausea, vomiting, or diarrhea. He currently is seeing infectious disease and still under their care at this point. Subsequently he also has both wounds which she has not been using collagen on as he did not receive that in his packaging he did not call us and let us know that. Apparently that just was missed on the order. Nonetheless we will get that straightened out today. 8/9-Patient returns for bilateral foot wounds, using Prisma with hydrogel moistened dressings, and the wounds appear stable. Patient using surgical shoes, avoiding much pressure or weightbearing as much as possible 8/16; patient has bilateral foot wounds. 1 on the right lateral foot proximally the other is on the left mid lateral foot. Both required debridement of callus and thick skin around the wounds. We have been using silver  collagen 8/27; patient has bilateral lateral foot wounds. The area on the left substantially surrounded by callus and dry skin. This was removed from the wound edge. The underlying wound is small. The area on the right measured somewhat smaller today. 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 Unfortunately I did not update his antibiotics during today's visit. 9/10 I reviewed Dr. Hale Bogus last notes he felt he had completed antibiotics his inflammatory markers were reasonably well controlled. He has a small wound on the lateral left foot and a tiny area on the right which is just above closed. He is using Hydrofera Blue with border foam he has bilateral surgical shoes 9/24; 2 week f/u. doing well. right foot is closed. left foot still undermined. 10/14; right foot remains closed at the fifth met head. The area over the base of the left fifth metatarsal has a small open area but considerable undermining towards the plantar foot. Thick callus  skin around this suggests an adequate pressure relief. We have talked about this. He says he is going to go back into his cam boot. I suggested a total contact cast he did not seem enamored with this suggestion 10/26; left foot base of the fifth metatarsal. Same condition as last time. He has skin over the area with an open wound however the skin is not adherent. He went to see Dr. Earleen Newport who did an x-ray and culture of his foot I have not reviewed the x-ray but the patient was not told anything. He is on doxycycline 11/11; since the patient was last here he was in the emergency room on 10/30 he was concerned about swelling in the left foot. They did not do any cultures or x-rays. They changed his antibiotics to cephalexin. Previous culture showed group B strep. The cephalexin is appropriate as doxycycline has less than predictable coverage. Arrives in clinic today with swelling over this area  under the wound. He also has a new wound on the right fifth metatarsal head 11/18; the patient has a difficult wound on the lateral aspect of the left fifth metatarsal head. The wound was almost ballotable last week I opened it slightly expecting to see purulence however there was just bleeding. I cultured this this was negative. X-ray unchanged. We are trying to get an MRI but I am not sure were going to be able to get this through his insurance. He also has an area on the right lateral fifth metatarsal head this looks healthier 12/3; the patient finally got our MRI. Surprisingly this did not show osteomyelitis. I did show the soft tissue ulceration at the lateral plantar aspect of the fifth metatarsal base with a tiny residual 6 mm abscess overlying the superficial fascia I have tried to culture this area I have not been able to get this to grow anything. Nevertheless the protruding tissue looks aggravated. I suspect we should try to treat the underlying "abscess with broad-spectrum antibiotics. I am going to start him on Levaquin and Flagyl. He has much less edema in his legs and I am going to continue to wrap his legs and see him weekly 12/10. I started Levaquin and Flagyl on him last week. He just picked up the Flagyl apparently there was some delay. The worry is the wound on the left fifth metatarsal base which is substantial and worsening. His foot looks like he inverts at the ankle making this a weightbearing surface. Certainly no improvement in fact I think the measurements of this are somewhat worse. We have been using 12/17; he apparently just got the Levaquin yesterday this is 2 weeks after the fact. He has completed the Flagyl. The area over the left fifth metatarsal base still has protruding granulation tissue although it does not look quite as bad as it did some weeks ago. He has severe bilateral lymphedema although we have not been treating him for wounds on his legs this is definitely  going to require compression. There was so much edema in the left I did not wish to put him in a total contact cast today. I am going to increase his compression from 3-4 layer. The area on the right lateral fifth met head actually look quite good and superficial. 12/23; patient arrived with callus on the right fifth met head and the substantial hyper granulated callused wound on the base of his fifth metatarsal. He says he is completing his Levaquin in 2 days but I do not think  that adds up with what I gave him but I will have to double check this. We are using Hydrofera Blue on both areas. My plan is to put the left leg in a cast the week after New Year's 04/06/2020; patient's wounds about the same. Right lateral fifth metatarsal head and left lateral foot over the base of the fifth metatarsal. There is undermining on the left lateral foot which I removed before application of total contact cast continuing with Hydrofera Blue new. Patient tells me he was seen by endocrinology today lab work was done [Dr. Kerr]. Also wondering whether he was referred to cardiology. I went over some lab work from previously does not have chronic renal failure certainly not nephrotic range proteinuria he does have very poorly controlled diabetes but this is not his most updated lab work. Hemoglobin A1c has been over 11 1/10; the patient had a considerable amount of leakage towards mid part of his left foot with macerated skin however the wound surface looks better the area on the right lateral fifth met head is better as well. I am going to change the dressing on the left foot under the total contact cast to silver alginate, continue with Hydrofera Blue on the right. 1/20; patient was in the total contact cast for 10 days. Considerable amount of drainage although the skin around the wound does not look too bad on the left foot. The area on the right fifth metatarsal head is closed. Our nursing staff reports large amount  of drainage out of the left lateral foot wound 1/25; continues with copious amounts of drainage described by our intake staff. PCR culture I did last week showed E. coli and Enterococcus faecalis and low quantities. Multiple resistance genes documented including extended spectrum beta lactamase, MRSA, MRSE, quinolone, tetracycline. The wound is not quite as good this week as it was 5 days ago but about the same size 2/3; continues with copious amounts of malodorous drainage per our intake nurse. The PCR culture I did 2 weeks ago showed E. coli and low quantities of Enterococcus. There were multiple resistance genes detected. I put Neosporin on him last week although this does not seem to have helped. The wound is slightly deeper today. Offloading continues to be an issue here although with the amount of drainage she has a total contact cast is just not going to work 2/10; moderate amount of drainage. Patient reports he cannot get his stocking on over the dressing. I told him we have to do that the nurse gave him suggestions on how to make this work. The wound is on the bottom and lateral part of his left foot. Is cultured predominantly grew low amounts of Enterococcus, E. coli and anaerobes. There were multiple resistance genes detected including extended spectrum beta lactamase, quinolone, tetracycline. I could not think of an easy oral combination to address this so for now I am going to do topical antibiotics provided by [facility] I think the main agents here are vancomycin and an aminoglycoside. We have to be able to give him access to the wounds to get the topical antibiotic on 2/17; moderate amount of drainage this is unchanged. He has his Keystone topical antibiotic against the deep tissue culture organisms. He has been using this and changing the dressing daily. Silver alginate on the wound surface. 2/24; using Keystone antibiotic with silver alginate on the top. He had too much drainage for a  total contact cast at one point although I think that is  improving and I think in the next week or 2 it might be possible to replace a total contact cast I did not do this today. In general the wound surface looks healthy however he continues to have thick rims of skin and subcutaneous tissue around the wide area of the circumference which I debrided 06/04/2020 upon evaluation today patient appears to be doing well in regard to his wound. I do feel like he is showing signs of improvement. There is little bit of callus and dead tissue around the edges of the wound as well as what appears to be a little bit of a sinus tract that is off to the side laterally I would perform debridement to clear that away today. 3/17; left lateral foot. The wound looks about the same as I remember. Not much depth surface looks healthy. No evidence of infection 3/25; left lateral foot. Wound surface looks about the same. Separating epithelium from the circumference. There really is no evidence of infection here however not making progress by my view 3/29; left lateral foot. Surface of the wound again looks reasonably healthy still thick skin and subcutaneous tissue around the wound margins. There is no evidence of infection. One of the concerns being brought up by the nurses has again the amount of drainage vis--vis continued use of a total contact cast 4/5; left lateral foot at roughly the base of the fifth metatarsal. Nice healthy looking granulated tissue with rims of epithelialization. The overall wound measurements are not any better but the tissue looks healthy. The only concern is the amount of drainage although he has no surrounding maceration with what we have been doing recently to absorb fluid and protect his skin. He also has lymphedema. 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 He states that the kerlix/coban under the TCC rolled down and could not get it back up. He has been using an offloading scooter and has somehow hurt his right foot using this device. This happened last week. He states that the side of his right foot developed a blister and opened. The top of his foot also has a few small open wounds he thinks is due to his socks rubbing in his shoes. He has not been using any dressings to the wound. He denies purulent drainage, fever/chills or erythema to the wounds. 4/22; patient presents for 1 week follow-up. He developed new wounds to the right foot that were evaluated at last clinic visit. He continues to have a total contact cast to the left leg and he reports no issues. He has been using silver collagen to the right foot wounds with no issues. He denies purulent drainage, fever/chills or erythema to the right foot wounds. He has no complaints today 4/25; patient presents for 1 week follow-up. He has a total contact cast of the left leg and reports no issues. He has been using silver alginate to the right foot wound. He denies purulent drainage, fever/chills or erythema to the right foot wounds. 5/2 patient presents for 1 week follow-up. T contact cast on the left. The wound which is on the base of the plantar foot at the base of the fifth metatarsal otal actually looks quite good and dimensions continue to gradually  contract. HOWEVER the area on the right lateral fifth metatarsal head is much larger than what I remember from 2 weeks ago. Once more is he has significant levels of hypergranulation. Noteworthy that he had this same hyper granulated response on his wound on the left foot at one point in time. So much so that he I thought there was an underlying fluid collection. Based on this I think this just needs debridement. 5/9; the wound on the  left actually continues to be gradually smaller with a healthy surface. Slight amount of drainage and maceration of the skin around but not too bad. However he has a large wound over the right fifth metatarsal head very much in the same configuration as his left foot wound was initially. I used silver nitrate to address the hyper granulated tissue no mechanical debridement 5/16; area on the left foot did not look as healthy this week deeper thick surrounding macerated skin and subcutaneous tissue. The area on the right foot fifth met head was about the same The area on the right ankle that we identified last week is completely broken down into an open wound presumably a stocking rubbing issue 5/23; patient has been using a total contact cast to the left side. He has been using silver alginate underneath. He has also been using silver alginate to the right foot wounds. He has no complaints today. He denies any signs of infection. 5/31; the left-sided wound looks some better measure smaller surface granulation looks better. We have been using silver alginate under the total contact cast The large area on his right fifth met head and right dorsal foot look about the same still using silver alginate 6/6; neither side is good as I was hoping although the surface area dimensions are better. A lot of maceration on his left and right foot around the wound edge. Area on the dorsal right foot looks better. He says he was traveling. I am not sure what does the amount of maceration around the plantar wounds may be drainage issues 6/13; in general the wound surfaces look quite good on both sides. Macerated skin and raised edges around the wound required debridement although in general especially on the left the surface area seems improved. The area on the right dorsal ankle is about the same I thought this would not be such a problem to close 6/20; not much change in either wound although the one on the right  looks a little better. Both wounds have thick macerated edges to the skin requiring debridements. We have been using silver alginate. The area on the dorsal right ankle is still open I thought this would be closed. 6/28; patient comes in today with a marked deterioration in the right foot wound fifth met head. Wide area of exposed bone this is a drastic change from last time. The area on the left there we have been casting is stagnant. We have been using silver alginate in both wound areas. 7/5; bone culture I did for PCR last time was positive for Pseudomonas, group B strep, Enterococcus and Staph aureus. There was no suggestion of methicillin resistance or ampicillin resistant genes. This was resistant to tetracycline however He comes into the clinic today with the area over his right plantar fifth metatarsal head which had been doing so well 2 weeks ago completely necrotic feeling bone. I do not know that this is going to be salvageable. The left foot wound is certainly no smaller but it has a better surface  and is superficial. 7/8; patient called in this morning to say that his total contact cast was rubbing against his foot. He states he is doing fine overall. He denies signs of infection. 7/12; continued deterioration in the wound over the right fifth metatarsal head crumbling bone. This is not going to be salvageable. The patient agrees and wants to be referred to Dr. Doran Durand which we will attempt to arrange as soon as possible. I am going to continue him on antibiotics as long as that takes so I will renew those today. The area on the left foot which is the base of the fifth metatarsal continues to look somewhat better. Healthy looking tissue no depth no debridement is necessary here. 7/20; the patient was kindly seen by Dr. Doran Durand of orthopedics on 10/19/2020. He agreed that he needed a ray amputation on the right and he said he would have a look at the fourth as well while he was  intraoperative. Towards this end we have taken him out of the total contact cast on the left we will put him in a wrap with Hydrofera Blue. As I understand things surgery is planned for 7/21 7/27; patient had his surgery last Thursday. 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 Apparently everything went well we did not still disturb that today The area on the left foot actually looks quite good. He has been much less mobile which probably explains this he did not seem to do well in the total contact cast secondary to drainage and maceration I think. 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 Fortunately there is no evidence of active infection at this time. No fevers, chills, nausea, vomiting, or diarrhea. Overall I think that he is actually doing extremely well. Nonetheless I do believe that he is staying off of this more following the surgery in his right foot that is the reason the left is doing so great. 8/16; left plantar foot wound. This looks smaller than the last time I saw this he is using Hydrofera Blue. The surgical wound on the right foot is being followed by Dr. Doran Durand we did not look at this today. He has surgical shoes on both feet 8/23; left plantar foot wound not as good this week. Surrounding macerated skin and subcutaneous tissue everything looks moist and wet. I do not think he is offloading this adequately. He is using a surgical shoe Apparently the right foot surgical wound is not open although I did not check his foot 8/31; left plantar foot lateral aspect. Much improved this week. He has no maceration. Some improvement in the surface area of the wound but most impressively the depth is come in we are using silver alginate. The patient is a Product/process development scientist. He is asked that we write him a letter so he can go back to work. I have also tried to see if we can write  something that will allow him to limit the amount of time that he is on his foot at work. 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 Hopefully the latter can be avoided 9/64; I believe the patient missed an appointment last week. He arrives in today with a wound looking roughly the same certainly no better. Undermining laterally and also inferiorly. We used molecuLight today in training  with the patient's permission.. We are using silver alginate Electronic Signature(s) Signed: 12/16/2020 3:40:47 PM By: [name] MD Entered By: [name] on 12/15/2020 08:37:08 -------------------------------------------------------------------------------- Physical Exam Details Patient Name: Date of Service: [name], [name] 12/15/2020 7:30 A M Medical Record Number: [id number] Patient Account Number: [id number] Date of Birth/Sex: Treating RN: Jun 09, 1985 (35 y.o. [name] Primary Care Provider: [name] Other Clinician: Referring Provider: Treating Provider/Extender: [name] in Treatment: 59 Constitutional Sitting or standing Blood Pressure is within target range for patient.. Pulse regular and within target range for patient.Marland Kitchen Respirations regular, non-labored and within target range.. Temperature is normal and within the target range for the patient.Marland Kitchen Appears in no distress. Notes Wound exam; left plantar foot at the base of the fifth metatarsal. Significant maceration and undermining especially inferiorly and towards middle of his foot. I used a #15 scalpel and pickups to remove skin and subcutaneous tissue to expose the undermining area. #5 curette to debride these areas and the circumference of the wound. Hemostasis with silver nitrate and direct pressure Electronic Signature(s) Signed: 12/16/2020 3:40:47 PM By: [name] MD Entered By: [name] on  12/15/2020 08:42:01 -------------------------------------------------------------------------------- Physician Orders Details Patient Name: Date of Service: [name], [name] 12/15/2020 7:30 A M Medical Record Number: [id number] Patient Account Number: [id number] Date of Birth/Sex: Treating RN: [birth year] (35 y.o. [name] Primary Care Provider: [name] Other Clinician: Referring Provider: Treating Provider/Extender: [name] in Treatment: 35 Verbal / Phone Orders: No Diagnosis Coding ICD-10 Coding Code Description E11.621 Type 2 diabetes mellitus with foot ulcer L97.528 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ity Follow-up Appointments ppointment in 1 week. - with Dr. Dellia Nims Return A Edema Control - Lymphedema / SCD / Other Bilateral Lower Extremities Elevate legs to the level of the heart or above for 30 minutes daily and/or when sitting, a frequency of: - throughout the day Avoid standing for long periods of time. Exercise regularly Moisturize legs daily. - right leg every night before bed. Compression stocking or Garment 20-30 mm/Hg pressure to: - Apply to right leg in the morning and remove at night. Off-Loading DH Walker Boot to: - Left Foot Additional Orders / Instructions Follow Nutritious Diet Wound Treatment Wound #3 - Foot Wound Laterality: Left, Lateral Cleanser: Soap and Water Every Other Day/30 Days Discharge Instructions: May shower and wash wound with dial antibacterial soap and water prior to dressing change. Cleanser: Wound Cleanser (Generic) Every Other Day/30 Days Discharge Instructions: Cleanse the wound with wound cleanser prior to applying a clean dressing using gauze sponges, not tissue or cotton balls. Peri-Wound Care: Zinc Oxide Ointment 30g tube Every Other Day/30 Days Discharge Instructions: Apply Zinc Oxide to periwound with each dressing change as needed. Prim  Dressing: KerraCel Ag Gelling Fiber Dressing, 4x5 in (silver alginate) (Generic) Every Other Day/30 Days ary Discharge Instructions: Apply silver alginate to wound bed as instructed Secondary Dressing: Woven Gauze Sponge, Non-Sterile 4x4 in (Generic) Every Other Day/30 Days Discharge Instructions: Apply over primary dressing as directed. Secondary Dressing: Zetuvit Plus 4x8 in (Generic) Every Other Day/30 Days Discharge Instructions: Apply over primary dressing as directed. Secondary Dressing: Optifoam Non-Adhesive Dressing, 4x4 in (Generic) Every Other Day/30 Days Discharge Instructions: Apply over primary dressing as directed. Secured With: Coban Self-Adherent Wrap 4x5 (in/yd) (Generic) Every Other Day/30 Days Discharge Instructions: Secure with Coban as directed. Secured With: [employer], 4.5x3.1 (in/yd) (Generic) Every Other  Day/30 Days Discharge Instructions: Secure with Kerlix as directed. Secured With: 102M Medipore H Soft Cloth Surgical T 4 x 2 (in/yd) (Generic) Every Other Day/30 Days ape Discharge Instructions: Secure dressing with tape as directed. Laboratory erobe culture (MICRO) - (ICD10 [phone] - Non-pressure chronic ulcer of other part of left foot Bacteria identified in Unspecified specimen by A with other specified severity) LOINC Code: 993-7 Convenience Name: Areobic culture-specimen not specified Electronic Signature(s) Signed: 12/15/2020 4:34:23 PM By: [name] Signed: 12/16/2020 3:40:47 PM By: [name] MD Entered By: [name] on 12/15/2020 09:06:25 -------------------------------------------------------------------------------- Problem List Details Patient Name: Date of Service: [name], [name] 12/15/2020 7:30 A M Medical Record Number: [id number] Patient Account Number: [id number] Date of Birth/Sex: Treating RN: 1985/09/02 (35 y.o. [name] Primary Care Provider: [name] Other Clinician: Referring Provider: Treating  Provider/Extender: [name] in Treatment: 59 Active Problems ICD-10 Encounter Code Description Active Date MDM Diagnosis E11.621 Type 2 diabetes mellitus with foot ulcer 10/24/2019 No Yes L97.528 Non-pressure chronic ulcer of other part of left foot with other specified 10/24/2019 No Yes severity Inactive Problems ICD-10 Code Description Active Date Inactive Date L97.518 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ity 10/24/2019 10/24/2019 L97.518 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ity 07/14/2020 07/14/2020 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, right ankle and foot 10/24/2019 10/24/2019 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 08/10/2020 08/10/2020 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, left ankle and foot 10/24/2019 10/24/2019 B95.62 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 10/24/2019 10/24/2019 classified elsewhere Resolved Problems Electronic Signature(s) Signed: 12/16/2020 3:40:47 PM By: [name] MD Entered By: [name] on 12/15/2020 08:32:28 -------------------------------------------------------------------------------- Progress Note Details Patient Name: Date of Service: [name], [name] 12/15/2020 7:30 A M Medical Record Number: [id number] Patient Account Number: [id number] Date of Birth/Sex: Treating RN: [birth year] (35 y.o. 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 He comes in for review of a wound over the plantar fifth metatarsal head extending into the lateral part of the foot. He was followed  for this previously by his podiatrist Dr. Cornelius Moras. As the patient tells his story he went to see podiatry first for a swelling he developed on the lateral part of his fifth metatarsal head in May. He states this was "open" by podiatry and the area closed. He was followed up in June and it was again opened callus removed and it closed promptly. There were plans being made for surgery on the fifth metatarsal head in June however his blood sugar was apparently too high for anesthesia. Apparently the area was debrided and opened again in June and it is never closed since. Looking over the records from podiatry I am really not able to follow this. It was clear when he was first seen it was before 5/14 at that point he already had a wound. By 5/17 the ulcer was resolved. I do not see anything about a procedure. On 5/28 noted to have pre-ulcerative moderate keratosis. X-ray noted 1/5 contracted toe and tailor's bunion and metatarsal deformity. On a visit date on 09/28/2018 the dorsal part of the left foot it healed and resolved. There was concern about swelling in  his lower extremity he was sent to the ER.. As far as I can tell he was seen in the ER on 7/12 with an ulcer on his left foot. A DVT rule out of the left leg was negative. I do not think I have complete records from podiatry but I am not able to verify the procedures this patient states he had. He states after the last procedure the wound has never closed although I am not able to follow this in the records I have from podiatry. He has not had a recent x-ray The patient has been using Neosporin on the wound. He is wearing a Darco shoe. He is still very active up on his foot working and exercising. Past medical history; type 2 diabetes ketosis-prone, leg swelling with a negative DVT study in July. Non-smoker ABI in our clinic was 0.85 on the left 10/16; substantial wound on the plantar left fifth met head extending laterally almost to the dorsal fifth  MTP. We have been using silver alginate we gave him a Darco forefoot off loader. An x-ray did not show evidence of osteomyelitis did note soft tissue emphysema which I think was due to gas tracking through an open wound. There is no doubt in my mind he requires an MRI 10/23; MRI not booked until 3 November at the earliest this is largely due to his glucose sensor in the right arm. We have been using silver alginate. There has been an improvement 10/29; I am still not exactly sure when his MRI is booked for. He says it is the third but it is the 10th in epic. This definitely needs to be done. He is running a low-grade fever today but no other symptoms. No real improvement in the 1 02/26/2019 patient presents today for a follow-up visit here in our clinic he is last been seen in the clinic on October 29. Subsequently we were working on getting MRI to evaluate and see what exactly was going on and where we would need to go from the standpoint of whether or not he had osteomyelitis and again what treatments were going be required. Subsequently the patient ended up being admitted to the hospital on 02/07/2019 and was discharged on 02/14/2019. This is a somewhat interesting admission with a discharge diagnosis of pneumonia due to COVID-19 although he was positive for COVID-19 when tested at the urgent care but negative x2 when he was actually in the hospital. With that being said he did have acute respiratory failure with hypoxia and it was noted he also have a left foot ulceration with osteomyelitis. With that being said he did require oxygen for his pneumonia and I level 4 L. He was placed on antivirals and steroids for the COVID-19. He was also transferred to the [facility] at one point. Nonetheless he did subsequently discharged home and since being home has done much better in that regard. The CT angiogram did not show any pulmonary embolism. With regard to the osteomyelitis the patient was  placed on vancomycin and Zosyn while in the hospital but has been changed to Augmentin at discharge. It was also recommended that he follow- up with wound care and podiatry. Podiatry however wanted him to see [name] according to the patient prior to them doing anything further. His hemoglobin A1c was 9.9 as noted in the hospital. Have an MRI of the left foot performed while in the hospital on 02/04/2019. 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 There is an overlying plantar open wound noted an abscess tracking back along the lateral aspect of the fifth metatarsal shaft. There is otherwise diffuse cellulitis and mild fasciitis without findings of polymyositis. The patient did have recently pneumonia secondary to COVID-19 I looked in the chart through epic and it does appear that the patient may need to have an additional x-ray just to ensure everything is cleared and that he has no airspace disease prior to putting him into the [name]. 03/05/2019; patient was readmitted to the clinic last week. He was hospitalized twice for a viral upper respiratory tract infection from 11/1 through 11/4 and then 11/5 through 11/12 ultimately this turned out to be Covid pneumonitis. Although he was discharged on oxygen he is not using it. He says he feels fine. He has no exercise limitation no cough no sputum. His O2 sat in our clinic today was 100% on room air. 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 He received Vanco and Zosyn in the hospital and then was discharged on 2 weeks of Augmentin. I do not see any relevant cultures. He was supposed to follow-up with infectious disease but I do not see that he has an appointment. 12/8; patient saw Dr. Novella Olive of infectious disease last week. He felt that he had had  adequate antibiotic therapy. He did not go to follow-up with Dr. Amalia Hailey of podiatry and I have again talked to him about the pros and cons of this. He does not want to consider a ray amputation of this time. He is aware of the risks of recurrence, migration etc. He started HBO today and tolerated this well. He can complete the Augmentin that I gave him last week. I have looked over the lab work that Dr. Chana Bode ordered his C-reactive protein was 3.3 and his sedimentation rate was 17. The C-reactive protein is never really been measurably that high in this patient 12/15; not much change in the wound today however he has undermining along the lateral part of the foot again more extensively than last week. He has some rims of epithelialization. We have been using silver alginate. He is undergoing hyperbarics but did not dive today 12/18; in for his obligatory first total contact cast change. Unfortunately there was pus coming from the undermining area around his fifth metatarsal head. This was cultured but will preclude reapplication of a cast. He is seen in conjunction with HBO 12/24; patient had staph lugdunensis in the wound in the undermining area laterally last time. We put him on doxycycline which should have covered this. The wound looks better today. I am going to give him another week of doxycycline before reattempting the total contact cast 12/31; the patient is completing antibiotics. Hemorrhagic debris in the distal part of the wound with some undermining distally. He also had hyper granulation. Extensive debridement with a #5 curette. The infected area that was on the lateral part of the fifth met head is closed over. I do not think he needs any more antibiotics. Patient was seen prior to HBO. Preparations for a total contact cast were made in the cast will be placed post hyperbarics 04/11/19; once again the patient arrives today without complaint. He had been in a cast all week noted that he  had heavy drainage this week. This resulted in large raised areas of macerated  tissue around the wound 1/14; wound bed looks better slightly smaller. Hydrofera Blue has been changing himself. He had a heavy drainage last week which caused a lot of maceration around the wound so I took him out of a total contact cast he says the drainage is actually better this week He is seen today in conjunction with HBO 1/21; returns to clinic. He was up in [place] for a day or 2 attending a funeral. He comes back in with the wound larger and with a large area of exposed bone. He had osteomyelitis and septic arthritis of the fifth left metatarsal head while he was in hospital. He received IV antibiotics in the hospital for a prolonged period of time then 3 weeks of Augmentin. Subsequently I gave him 2 weeks of doxycycline for more superficial wound infection. When I saw this last week the wound was smaller the surface of the wound looks satisfactory. 1/28; patient missed hyperbarics today. Bone biopsy I did last time showed Enterococcus faecalis and Staphylococcus lugdunensis . He has a wide area of exposed bone. We are going to use silver alginate as of today. I had another ethical discussion with the patient. 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 In this situation I think the likelihood of healing this is low. Therefore I have recommended a ray amputation and with the patient's agreement I have referred him to Dr. Doran Durand. The other issue is that his compliance with hyperbarics has been minimal because of his work schedule and given his underlying decision I am going to stop this today READMISSION 10/24/2019 MRI 09/29/2019 left foot IMPRESSION: 1. Apparent skin ulceration inferior and lateral to the 5th metatarsal base with underlying heterogeneous T2 signal and enhancement in the subcutaneous fat. Small peripherally enhancing fluid collections along the plantar and  lateral aspects of the 5th metatarsal base suspicious for abscesses. 2. Interval amputation through the mid 5th metatarsal with nonspecific low-level marrow edema and enhancement. Given the proximity to the adjacent soft tissue inflammatory changes, osteomyelitis cannot be excluded. 3. The additional bones appear unremarkable. MRI 09/29/2019 right foot IMPRESSION: 1. Soft tissue ulceration lateral to the 5th MTP joint. There is low-level T2 hyperintensity within the 4th and 5th metatarsal heads and adjacent proximal phalanges without abnormal T1 signal or cortical destruction. These findings are nonspecific and could be seen with early marrow edema, hyperemia or early osteomyelitis. No evidence of septic joint. 2. Mild tenosynovitis and synovial enhancement associated with the extensor digitorum tendons at the level of the midfoot. 3. Diffuse low-level muscular T2 hyperintensity and enhancement, most consistent with diabetic myopathy. LEFT FOOT BONE Methicillin resistant staphylococcus aureus Staphylococcus lugdunensis MIC MIC CIPROFLOXACIN >=8 RESISTANT Resistant <=0.5 SENSI... Sensitive CLINDAMYCIN <=0.25 SENS... Sensitive >=8 RESISTANT Resistant ERYTHROMYCIN >=8 RESISTANT Resistant >=8 RESISTANT Resistant GENTAMICIN <=0.5 SENSI... Sensitive <=0.5 SENSI... Sensitive Inducible Clindamycin NEGATIVE Sensitive NEGATIVE Sensitive OXACILLIN >=4 RESISTANT Resistant 2 SENSITIVE Sensitive RIFAMPIN <=0.5 SENSI... Sensitive <=0.5 SENSI... Sensitive TETRACYCLINE <=1 SENSITIVE Sensitive <=1 SENSITIVE Sensitive TRIMETH/SULFA <=10 SENSIT Sensitive <=10 SENSIT Sensitive ... Marland Kitchen.. VANCOMYCIN 1 SENSITIVE Sensitive <=0.5 SENSI... Sensitive Right foot bone . Component 3 wk ago Specimen Description BONE Special Requests RIGHT 4 METATARSAL SAMPLE B Gram Stain NO WBC SEEN NO ORGANISMS SEEN Culture RARE METHICILLIN RESISTANT STAPHYLOCOCCUS AUREUS NO ANAEROBES ISOLATED Performed at [facility], [place] [address], [place], [place] [address] Report Status 10/08/2019 FINAL Organism ID, Bacteria METHICILLIN RESISTANT STAPHYLOCOCCUS AUREUS Resulting Agency [facility] Susceptibility Methicillin resistant staphylococcus aureus  MIC CIPROFLOXACIN >=8 RESISTANT Resistant CLINDAMYCIN <=0.25 SENS... Sensitive ERYTHROMYCIN >=8 RESISTANT Resistant GENTAMICIN <=0.5 SENSI... Sensitive Inducible Clindamycin NEGATIVE Sensitive OXACILLIN >=4 RESISTANT Resistant RIFAMPIN <=0.5 SENSI... Sensitive TETRACYCLINE <=1 SENSITIVE Sensitive TRIMETH/SULFA <=10 SENSIT Sensitive ... VANCOMYCIN 1 SENSITIVE Sensitive This is a patient we had in clinic earlier this year with a wound over his left fifth metatarsal head. 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 Doran Durand on 05/16/2019. He seems to have developed problems with wounds on his bilateral feet in June 2021 although he may have had problems earlier than this. He was in an urgent care with a right foot ulcer on 09/26/2019 and given a course of doxycycline. This was apparently after having trouble getting into see orthopedics. He was seen by podiatry on 09/28/2019 noted to have bilateral lower extremity ulcers including the left lateral fifth metatarsal base and the right subfifth met head. It was noted that had purulent drainage at that time. He required hospitalization from 6/20 through 7/2. This was because of worsening right foot wounds. He underwent bilateral operative incision and drainage and bone biopsies bilaterally. Culture results are listed above. He has been referred back to clinic by Dr. Jacqualyn Posey of podiatry. He is also followed by Dr. Megan Salon who saw him  yesterday. He was discharged from hospital on Zyvox Flagyl and Levaquin and yesterday changed to doxycycline Flagyl and Levaquin. His inflammatory markers on 6/26 showed a sedimentation rate of 129 and a C-reactive protein of 5. This is improved to 14 and 1.3 respectively. This would indicate improvement. ABIs in our clinic today were 1.23 on the right and 1.20 on the left 11/01/2019 on evaluation today patient appears to be doing fairly well in regard to the wounds on his feet at this point. Fortunately there is no signs of active infection at this time. No fevers, chills, nausea, vomiting, or diarrhea. He currently is seeing infectious disease and still under their care at this point. Subsequently he also has both wounds which she has not been using collagen on as he did not receive that in his packaging he did not call us and let us know that. Apparently that just was missed on the order. Nonetheless we will get that straightened out today. 8/9-Patient returns for bilateral foot wounds, using Prisma with hydrogel moistened dressings, and the wounds appear stable. Patient using surgical shoes, avoiding much pressure or weightbearing as much as possible 8/16; patient has bilateral foot wounds. 1 on the right lateral foot proximally the other is on the left mid lateral foot. Both required debridement of callus and thick skin around the wounds. We have been using silver collagen 8/27; patient has bilateral lateral foot wounds. The area on the left substantially surrounded by callus and dry skin. This was removed from the wound edge. The underlying wound is small. The area on the right measured somewhat smaller today. 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 Unfortunately I did not update his antibiotics during today's visit. 9/10 I reviewed Dr. Hale Bogus last notes he felt he had completed antibiotics his inflammatory markers were  reasonably well controlled. He has a small wound on the lateral left foot and a tiny area on the right  which is just above closed. He is using Hydrofera Blue with border foam he has bilateral surgical shoes 9/24; 2 week f/u. doing well. right foot is closed. left foot still undermined. 10/14; right foot remains closed at the fifth met head. The area over the base of the left fifth metatarsal has a small open area but considerable undermining towards the plantar foot. Thick callus skin around this suggests an adequate pressure relief. We have talked about this. He says he is going to go back into his cam boot. I suggested a total contact cast he did not seem enamored with this suggestion 10/26; left foot base of the fifth metatarsal. Same condition as last time. He has skin over the area with an open wound however the skin is not adherent. He went to see Dr. Earleen Newport who did an x-ray and culture of his foot I have not reviewed the x-ray but the patient was not told anything. He is on doxycycline 11/11; since the patient was last here he was in the emergency room on 10/30 he was concerned about swelling in the left foot. They did not do any cultures or x-rays. They changed his antibiotics to cephalexin. Previous culture showed group B strep. The cephalexin is appropriate as doxycycline has less than predictable coverage. Arrives in clinic today with swelling over this area under the wound. He also has a new wound on the right fifth metatarsal head 11/18; the patient has a difficult wound on the lateral aspect of the left fifth metatarsal head. The wound was almost ballotable last week I opened it slightly expecting to see purulence however there was just bleeding. I cultured this this was negative. X-ray unchanged. We are trying to get an MRI but I am not sure were going to be able to get this through his insurance. He also has an area on the right lateral fifth metatarsal head this looks healthier 12/3;  the patient finally got our MRI. Surprisingly this did not show osteomyelitis. I did show the soft tissue ulceration at the lateral plantar aspect of the fifth metatarsal base with a tiny residual 6 mm abscess overlying the superficial fascia I have tried to culture this area I have not been able to get this to grow anything. Nevertheless the protruding tissue looks aggravated. I suspect we should try to treat the underlying "abscess with broad-spectrum antibiotics. I am going to start him on Levaquin and Flagyl. He has much less edema in his legs and I am going to continue to wrap his legs and see him weekly 12/10. I started Levaquin and Flagyl on him last week. He just picked up the Flagyl apparently there was some delay. The worry is the wound on the left fifth metatarsal base which is substantial and worsening. His foot looks like he inverts at the ankle making this a weightbearing surface. Certainly no improvement in fact I think the measurements of this are somewhat worse. We have been using 12/17; he apparently just got the Levaquin yesterday this is 2 weeks after the fact. He has completed the Flagyl. The area over the left fifth metatarsal base still has protruding granulation tissue although it does not look quite as bad as it did some weeks ago. He has severe bilateral lymphedema although we have not been treating him for wounds on his legs this is definitely going to require compression. There was so much edema in the left I did not wish to put him in a total contact cast today.  I am going to increase his compression from 3-4 layer. The area on the right lateral fifth met head actually look quite good and superficial. 12/23; patient arrived with callus on the right fifth met head and the substantial hyper granulated callused wound on the base of his fifth metatarsal. He says he is completing his Levaquin in 2 days but I do not think that adds up with what I gave him but I will have to  double check this. We are using Hydrofera Blue on both areas. My plan is to put the left leg in a cast the week after New Year's 04/06/2020; patient's wounds about the same. Right lateral fifth metatarsal head and left lateral foot over the base of the fifth metatarsal. There is undermining on the left lateral foot which I removed before application of total contact cast continuing with Hydrofera Blue new. Patient tells me he was seen by endocrinology today lab work was done [Dr. Kerr]. Also wondering whether he was referred to cardiology. I went over some lab work from previously does not have chronic renal failure certainly not nephrotic range proteinuria he does have very poorly controlled diabetes but this is not his most updated lab work. Hemoglobin A1c has been over 11 1/10; the patient had a considerable amount of leakage towards mid part of his left foot with macerated skin however the wound surface looks better the area on the right lateral fifth met head is better as well. I am going to change the dressing on the left foot under the total contact cast to silver alginate, continue with Hydrofera Blue on the right. 1/20; patient was in the total contact cast for 10 days. Considerable amount of drainage although the skin around the wound does not look too bad on the left foot. The area on the right fifth metatarsal head is closed. Our nursing staff reports large amount of drainage out of the left lateral foot wound 1/25; continues with copious amounts of drainage described by our intake staff. PCR culture I did last week showed E. coli and Enterococcus faecalis and low quantities. Multiple resistance genes documented including extended spectrum beta lactamase, MRSA, MRSE, quinolone, tetracycline. The wound is not quite as good this week as it was 5 days ago but about the same size 2/3; continues with copious amounts of malodorous drainage per our intake nurse. The PCR culture I did 2 weeks ago  showed E. coli and low quantities of Enterococcus. There were multiple resistance genes detected. I put Neosporin on him last week although this does not seem to have helped. The wound is slightly deeper today. Offloading continues to be an issue here although with the amount of drainage she has a total contact cast is just not going to work 2/10; moderate amount of drainage. Patient reports he cannot get his stocking on over the dressing. I told him we have to do that the nurse gave him suggestions on how to make this work. The wound is on the bottom and lateral part of his left foot. Is cultured predominantly grew low amounts of Enterococcus, E. coli and anaerobes. There were multiple resistance genes detected including extended spectrum beta lactamase, quinolone, tetracycline. I could not think of an easy oral combination to address this so for now I am going to do topical antibiotics provided by [facility] I think the main agents here are vancomycin and an aminoglycoside. We have to be able to give him access to the wounds to get the topical antibiotic  on 2/17; moderate amount of drainage this is unchanged. He has his Keystone topical antibiotic against the deep tissue culture organisms. He has been using this and changing the dressing daily. Silver alginate on the wound surface. 2/24; using Keystone antibiotic with silver alginate on the top. He had too much drainage for a total contact cast at one point although I think that is improving and I think in the next week or 2 it might be possible to replace a total contact cast I did not do this today. In general the wound surface looks healthy however he continues to have thick rims of skin and subcutaneous tissue around the wide area of the circumference which I debrided 06/04/2020 upon evaluation today patient appears to be doing well in regard to his wound. I do feel like he is showing signs of improvement. There is little bit of callus and dead  tissue around the edges of the wound as well as what appears to be a little bit of a sinus tract that is off to the side laterally I would perform debridement to clear that away today. 3/17; left lateral foot. The wound looks about the same as I remember. Not much depth surface looks healthy. No evidence of infection 3/25; left lateral foot. Wound surface looks about the same. Separating epithelium from the circumference. There really is no evidence of infection here however not making progress by my view 3/29; left lateral foot. Surface of the wound again looks reasonably healthy still thick skin and subcutaneous tissue around the wound margins. There is no evidence of infection. One of the concerns being brought up by the nurses has again the amount of drainage vis--vis continued use of a total contact cast 4/5; left lateral foot at roughly the base of the fifth metatarsal. Nice healthy looking granulated tissue with rims of epithelialization. The overall wound measurements are not any better but the tissue looks healthy. The only concern is the amount of drainage although he has no surrounding maceration with what we have been doing recently to absorb fluid and protect his skin. He also has lymphedema. 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 He states that the kerlix/coban under the TCC rolled down and could not get it back up. He has been using an offloading scooter and has somehow hurt his right foot using this device. This happened last week. He states that the side of his right foot developed a blister and opened. The top of his foot also has a few small open wounds he thinks is due to his socks rubbing in his shoes. He has not been using any  dressings to the wound. He denies purulent drainage, fever/chills or erythema to the wounds. 4/22; patient presents for 1 week follow-up. He developed new wounds to the right foot that were evaluated at last clinic visit. He continues to have a total contact cast to the left leg and he reports no issues. He has been using silver collagen to the right foot wounds with no issues. He denies purulent drainage, fever/chills or erythema to the right foot wounds. He has no complaints today 4/25; patient presents for 1 week follow-up. He has a total contact cast of the left leg and reports  no issues. He has been using silver alginate to the right foot wound. He denies purulent drainage, fever/chills or erythema to the right foot wounds. 5/2 patient presents for 1 week follow-up. T contact cast on the left. The wound which is on the base of the plantar foot at the base of the fifth metatarsal otal actually looks quite good and dimensions continue to gradually contract. HOWEVER the area on the right lateral fifth metatarsal head is much larger than what I remember from 2 weeks ago. Once more is he has significant levels of hypergranulation. Noteworthy that he had this same hyper granulated response on his wound on the left foot at one point in time. So much so that he I thought there was an underlying fluid collection. Based on this I think this just needs debridement. 5/9; the wound on the left actually continues to be gradually smaller with a healthy surface. Slight amount of drainage and maceration of the skin around but not too bad. However he has a large wound over the right fifth metatarsal head very much in the same configuration as his left foot wound was initially. I used silver nitrate to address the hyper granulated tissue no mechanical debridement 5/16; area on the left foot did not look as healthy this week deeper thick surrounding macerated skin and subcutaneous tissue. oo The area on the right  foot fifth met head was about the same oo The area on the right ankle that we identified last week is completely broken down into an open wound presumably a stocking rubbing issue 5/23; patient has been using a total contact cast to the left side. He has been using silver alginate underneath. He has also been using silver alginate to the right foot wounds. He has no complaints today. He denies any signs of infection. 5/31; the left-sided wound looks some better measure smaller surface granulation looks better. We have been using silver alginate under the total contact cast oo The large area on his right fifth met head and right dorsal foot look about the same still using silver alginate 6/6; neither side is good as I was hoping although the surface area dimensions are better. A lot of maceration on his left and right foot around the wound edge. Area on the dorsal right foot looks better. He says he was traveling. I am not sure what does the amount of maceration around the plantar wounds may be drainage issues 6/13; in general the wound surfaces look quite good on both sides. Macerated skin and raised edges around the wound required debridement although in general especially on the left the surface area seems improved. oo The area on the right dorsal ankle is about the same I thought this would not be such a problem to close 6/20; not much change in either wound although the one on the right looks a little better. Both wounds have thick macerated edges to the skin requiring debridements. We have been using silver alginate. The area on the dorsal right ankle is still open I thought this would be closed. 6/28; patient comes in today with a marked deterioration in the right foot wound fifth met head. Wide area of exposed bone this is a drastic change from last time. The area on the left there we have been casting is stagnant. We have been using silver alginate in both wound areas. 7/5; bone culture I  did for PCR last time was positive for Pseudomonas, group B strep, Enterococcus and Staph aureus. There  was no suggestion of methicillin resistance or ampicillin resistant genes. This was resistant to tetracycline however He comes into the clinic today with the area over his right plantar fifth metatarsal head which had been doing so well 2 weeks ago completely necrotic feeling bone. I do not know that this is going to be salvageable. The left foot wound is certainly no smaller but it has a better surface and is superficial. 7/8; patient called in this morning to say that his total contact cast was rubbing against his foot. He states he is doing fine overall. He denies signs of infection. 7/12; continued deterioration in the wound over the right fifth metatarsal head crumbling bone. This is not going to be salvageable. The patient agrees and wants to be referred to Dr. Doran Durand which we will attempt to arrange as soon as possible. I am going to continue him on antibiotics as long as that takes so I will renew those today. The area on the left foot which is the base of the fifth metatarsal continues to look somewhat better. Healthy looking tissue no depth no debridement is necessary here. 7/20; the patient was kindly seen by Dr. Doran Durand of orthopedics on 10/19/2020. 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 Towards this end we have taken him out of the total contact cast on the left we will put him in a wrap with Hydrofera Blue. As I understand things surgery is planned for 7/21 7/27; patient had his surgery last Thursday. 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 Apparently everything went well we did not still disturb that today The area on the left foot actually looks quite good. He has been much less mobile which probably explains this he did not seem to do well in the total contact cast secondary to drainage  and maceration I think. 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 Fortunately there is no evidence of active infection at this time. No fevers, chills, nausea, vomiting, or diarrhea. Overall I think that he is actually doing extremely well. Nonetheless I do believe that he is staying off of this more following the surgery in his right foot that is the reason the left is doing so great. 8/16; left plantar foot wound. This looks smaller than the last time I saw this he is using Hydrofera Blue. The surgical wound on the right foot is being followed by Dr. Doran Durand we did not look at this today. He has surgical shoes on both feet 8/23; left plantar foot wound not as good this week. Surrounding macerated skin and subcutaneous tissue everything looks moist and wet. I do not think he is offloading this adequately. He is using a surgical shoe Apparently the right foot surgical wound is not open although I did not check his foot 8/31; left plantar foot lateral aspect. Much improved this week. He has no maceration. Some improvement in the surface area of the wound but most impressively the depth is come in we are using silver alginate. The patient is a Product/process development scientist. He is asked that we write him a letter so he can go back to work. I have also tried to see if we can write something that will allow him to limit the amount of time that he is  on his foot at work. 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 Hopefully the latter can be avoided 9/64; I believe the patient missed an appointment last week. He arrives in today with a wound looking roughly the same certainly no better. Undermining laterally and also inferiorly. We used molecuLight today in training with the patient's permission.. We are using silver  alginate Objective Constitutional Sitting or standing Blood Pressure is within target range for patient.. Pulse regular and within target range for patient.Marland Kitchen Respirations regular, non-labored and within target range.. Temperature is normal and within the target range for the patient.Marland Kitchen Appears in no distress. Vitals Time Taken: 7:50 AM, Height: 77 in, Weight: 280 lbs, BMI: 33.2, Pulse: 75 bpm, Respiratory Rate: 16 breaths/min, Blood Pressure: 133/88 mmHg, Capillary Blood Glucose: 60 mg/dl. General Notes: Wound exam; left plantar foot at the base of the fifth metatarsal. Significant maceration and undermining especially inferiorly and towards middle of his foot. I used a #15 scalpel and pickups to remove skin and subcutaneous tissue to expose the undermining area. #5 curette to debride these areas and the circumference of the wound. Hemostasis with silver nitrate and direct pressure Integumentary (Hair, Skin) Wound #3 status is Open. Original cause of wound was Trauma. The date acquired was: 10/02/2019. The wound has been in treatment 59 weeks. The wound is located on the Left,Lateral Foot. The wound measures 1.8cm length x 2.6cm width x 0.3cm depth; 3.676cm^2 area and 1.103cm^3 volume. There is Fat Layer (Subcutaneous Tissue) exposed. There is no tunneling noted, however, there is undermining starting at 7:00 and ending at 11:00 with a maximum distance of 0.7cm. There is a large amount of serosanguineous drainage noted. The wound margin is well defined and not attached to the wound base. There is large (67- 100%) red granulation within the wound bed. There is no necrotic tissue within the wound bed. 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 There was a Excisional Skin/Subcutaneous Tissue Debridement with a total area of 2 sq cm performed by [name], MD. With the following instrument(s): Curette, Forceps, and Scissors to remove Non-Viable tissue/material. Material removed includes Callus and Subcutaneous Tissue and after achieving pain control using Other (Benzocaine). No specimens were taken. A time out was conducted at 08:15, prior to the start of the procedure. A Moderate amount of bleeding was controlled with Silver Nitrate. The procedure was tolerated well. Post Debridement Measurements: 3.3cm length x 4cm width x 0.3cm depth; 3.11cm^3 volume. Character of Wound/Ulcer Post Debridement is stable. Severity of Tissue Post Debridement is: Fat layer exposed. Post procedure Diagnosis Wound #3: Same as Pre-Procedure Plan Follow-up Appointments: Return Appointment in 1 week. - with Dr. Dellia Nims Edema Control - Lymphedema / SCD / Other: Elevate legs to the level of the heart or above for 30 minutes daily and/or when sitting, a frequency of: - throughout the day Avoid standing for long periods of time. Exercise regularly Moisturize legs daily. - right leg every night before bed. Compression stocking or Garment 20-30 mm/Hg pressure to: - Apply to right leg in the morning and remove at night. Off-Loading: DH Walker Boot to: - Left Foot  Additional Orders / Instructions: Follow Nutritious Diet WOUND #3: - Foot Wound Laterality: Left, Lateral Cleanser: Soap and Water Every Other Day/30 Days Discharge Instructions: May shower and wash wound with dial antibacterial soap and water prior to dressing change. Cleanser: Wound Cleanser (Generic) Every Other Day/30 Days Discharge Instructions: Cleanse the wound with wound cleanser prior to applying a clean dressing using gauze sponges, not tissue or cotton balls. Peri-Wound Care: Zinc Oxide Ointment 30g tube Every Other Day/30  Days Discharge Instructions: Apply Zinc Oxide to periwound with each dressing change as needed. Prim Dressing: KerraCel Ag Gelling Fiber Dressing, 4x5 in (silver alginate) (Generic) Every Other Day/30 Days ary Discharge Instructions: Apply silver alginate to wound bed as instructed Secondary Dressing: Woven Gauze Sponge, Non-Sterile 4x4 in (Generic) Every Other Day/30 Days Discharge Instructions: Apply over primary dressing as directed. Secondary Dressing: Zetuvit Plus 4x8 in (Generic) Every Other Day/30 Days Discharge Instructions: Apply over primary dressing as directed. Secondary Dressing: Optifoam Non-Adhesive Dressing, 4x4 in (Generic) Every Other Day/30 Days Discharge Instructions: Apply over primary dressing as directed. Secured With: Coban Self-Adherent Wrap 4x5 (in/yd) (Generic) Every Other Day/30 Days Discharge Instructions: Secure with Coban as directed. Secured With: [employer], 4.5x3.1 (in/yd) (Generic) Every Other Day/30 Days Discharge Instructions: Secure with Kerlix as directed. Secured With: 16M Medipore H Soft Cloth Surgical T 4 x 2 (in/yd) (Generic) Every Other Day/30 Days ape Discharge Instructions: Secure dressing with tape as directed. 1. I am continuing with silver alginate 2 postdebridement culture done for CandS. No empiric topical or systemic antibiotics were prescribed 3. We use molecuLight as a training exercise. It documented read for lessons around the circumference. Mid part of the wound looks like healthy granulation Electronic Signature(s) Signed: 12/16/2020 3:40:47 PM By: [name] MD Entered By: [name] on 12/15/2020 08:44:02 -------------------------------------------------------------------------------- SuperBill Details Patient Name: Date of Service: [name], [name] 12/15/2020 Medical Record Number: [id number] Patient Account Number: [id number] Date of Birth/Sex: Treating RN: 1985-07-18 (35 y.o. [name] Primary Care  Provider: [name] Other Clinician: Referring Provider: Treating Provider/Extender: [name] in Treatment: 59 Diagnosis Coding ICD-10 Codes Code Description E11.621 Type 2 diabetes mellitus with foot ulcer L97.528 Non-pressure chronic ulcer of other part of left foot with other specified severity Facility Procedures CPT4 Code: [id number] IC Description: 042 - DEB SUBQ TISSUE 20 SQ CM/< [name]-10 Diagnosis Description L97.528 Non-pressure chronic ulcer of other part of left foot with other specified severit Modifier: 1 y Quantity: Physician Procedures : CPT4 Code Description Modifier [id number] - WC PHYS SUBQ TISS 20 SQ CM ICD-10 Diagnosis Description L97.528 Non-pressure chronic ulcer of other part of left foot with other specified severity Quantity: 1 Electronic Signature(s) Signed: 12/16/2020 3:40:47 PM By: [name] MD Entered By: [name] on 12/15/2020 08:44:13

## 2020-12-21 LAB — AEROBIC/ANAEROBIC CULTURE W GRAM STAIN (SURGICAL/DEEP WOUND): Gram Stain: NONE SEEN

## 2020-12-23 ENCOUNTER — Encounter (HOSPITAL_BASED_OUTPATIENT_CLINIC_OR_DEPARTMENT_OTHER): Payer: BC Managed Care – PPO | Admitting: Internal Medicine

## 2020-12-23 ENCOUNTER — Other Ambulatory Visit: Payer: Self-pay

## 2020-12-23 DIAGNOSIS — E11621 Type 2 diabetes mellitus with foot ulcer: Secondary | ICD-10-CM | POA: Diagnosis not present

## 2020-12-23 NOTE — Progress Notes (Signed)
[place], [name] ([id number]) Visit Report for 12/23/2020 HPI Details Patient Name: Date of Service: [name] 12/23/2020 7:30 A M Medical Record Number: [id number] Patient Account Number: [id number] Date of Birth/Sex: Treating RN: 1985/05/20 (35 y.o. 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 He comes in for review of a wound over the plantar fifth metatarsal head extending into the lateral part of the foot. He was followed for this previously by his podiatrist Dr. Cornelius Moras. As the patient tells his story he went to see podiatry first for a swelling he developed on the lateral part of his fifth metatarsal head in May. He states this was "open" by podiatry and the area closed. He was followed up in June and it was again opened callus removed and it closed promptly. There were plans being made for surgery on the fifth metatarsal head in June however his blood sugar was apparently too high for anesthesia. Apparently the area was debrided and opened again in June and it is never closed since. Looking over the records from podiatry I am really not able to follow this. It was clear when he was first seen it was before 5/14 at that point he already had a wound. By 5/17 the ulcer was resolved. I do not see anything about a procedure. On 5/28 noted to have pre-ulcerative moderate keratosis. X-ray noted 1/5 contracted toe and tailor's bunion and metatarsal deformity. On a visit date on 09/28/2018 the dorsal part of the left foot it healed and resolved. There was concern about swelling in his lower extremity he was sent to the ER.. As far as I can tell he was seen in the ER on 7/12 with an ulcer on his left foot. A DVT rule  out of the left leg was negative. I do not think I have complete records from podiatry but I am not able to verify the procedures this patient states he had. He states after the last procedure the wound has never closed although I am not able to follow this in the records I have from podiatry. He has not had a recent x-ray The patient has been using Neosporin on the wound. He is wearing a Darco shoe. He is still very active up on his foot working and exercising. Past medical history; type 2 diabetes ketosis-prone, leg swelling with a negative DVT study in July. Non-smoker ABI in our clinic was 0.85 on the left 10/16; substantial wound on the plantar left fifth met head extending laterally almost to the dorsal fifth MTP. We have been using silver alginate we gave him a Darco forefoot off loader. An x-ray did not show evidence of osteomyelitis did note soft tissue emphysema which I think was due to gas tracking through an open wound. There is no doubt in my mind he requires an MRI 10/23; MRI not booked until 3 November at the earliest this is largely due to his glucose sensor in the right arm. We have been using silver alginate. There has been an improvement 10/29; I am still not exactly sure when his MRI is booked for. He says it is the third but it is the 10th in epic. This definitely needs to be done. He is running a low-grade fever today but no other symptoms. No real  improvement in the 1 02/26/2019 patient presents today for a follow-up visit here in our clinic he is last been seen in the clinic on October 29. Subsequently we were working on getting MRI to evaluate and see what exactly was going on and where we would need to go from the standpoint of whether or not he had osteomyelitis and again what treatments were going be required. Subsequently the patient ended up being admitted to the hospital on 02/07/2019 and was discharged on 02/14/2019. This is a somewhat interesting admission with a  discharge diagnosis of pneumonia due to COVID-19 although he was positive for COVID-19 when tested at the urgent care but negative x2 when he was actually in the hospital. With that being said he did have acute respiratory failure with hypoxia and it was noted he also have a left foot ulceration with osteomyelitis. With that being said he did require oxygen for his pneumonia and I level 4 L. He was placed on antivirals and steroids for the COVID-19. He was also transferred to the [facility] at one point. Nonetheless he did subsequently discharged home and since being home has done much better in that regard. The CT angiogram did not show any pulmonary embolism. With regard to the osteomyelitis the patient was placed on vancomycin and Zosyn while in the hospital but has been changed to Augmentin at discharge. It was also recommended that he follow- up with wound care and podiatry. Podiatry however wanted him to see [name] according to the patient prior to them doing anything further. His hemoglobin A1c was 9.9 as noted in the hospital. Have an MRI of the left foot performed while in the hospital on 02/04/2019. 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 There is an overlying plantar open wound noted an abscess tracking back along the lateral aspect of the fifth metatarsal shaft. There is otherwise diffuse cellulitis and mild fasciitis without findings of polymyositis. The patient did have recently pneumonia secondary to COVID-19 I looked in the chart through epic and it does appear that the patient may need to have an additional x-ray just to ensure everything is cleared and that he has no airspace disease prior to putting him into the [name]. 03/05/2019; patient was readmitted to the clinic last week. He was hospitalized twice for a viral upper respiratory tract infection from 11/1 through 11/4 and  then 11/5 through 11/12 ultimately this turned out to be Covid pneumonitis. Although he was discharged on oxygen he is not using it. He says he feels fine. He has no exercise limitation no cough no sputum. His O2 sat in our clinic today was 100% on room air. 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 He received Vanco and Zosyn in the hospital and then was discharged on 2 weeks of Augmentin. I do not see any relevant cultures. He was supposed to follow-up with infectious disease but I do not see that he has an appointment. 12/8; patient saw Dr. Novella Olive of infectious disease last week. He felt that he had had adequate antibiotic therapy. He did not go to follow-up with Dr. Amalia Hailey of podiatry and I have again talked to him about the pros and cons of this. He does not want to consider a ray amputation of this time. He is aware of the risks  of recurrence, migration etc. He started HBO today and tolerated this well. He can complete the Augmentin that I gave him last week. I have looked over the lab work that Dr. Chana Bode ordered his C-reactive protein was 3.3 and his sedimentation rate was 17. The C-reactive protein is never really been measurably that high in this patient 12/15; not much change in the wound today however he has undermining along the lateral part of the foot again more extensively than last week. He has some rims of epithelialization. We have been using silver alginate. He is undergoing hyperbarics but did not dive today 12/18; in for his obligatory first total contact cast change. Unfortunately there was pus coming from the undermining area around his fifth metatarsal head. This was cultured but will preclude reapplication of a cast. He is seen in conjunction with HBO 12/24; patient had staph lugdunensis in the wound in the undermining area laterally last time. We put him on  doxycycline which should have covered this. The wound looks better today. I am going to give him another week of doxycycline before reattempting the total contact cast 12/31; the patient is completing antibiotics. Hemorrhagic debris in the distal part of the wound with some undermining distally. He also had hyper granulation. Extensive debridement with a #5 curette. The infected area that was on the lateral part of the fifth met head is closed over. I do not think he needs any more antibiotics. Patient was seen prior to HBO. Preparations for a total contact cast were made in the cast will be placed post hyperbarics 04/11/19; once again the patient arrives today without complaint. He had been in a cast all week noted that he had heavy drainage this week. This resulted in large raised areas of macerated tissue around the wound 1/14; wound bed looks better slightly smaller. Hydrofera Blue has been changing himself. He had a heavy drainage last week which caused a lot of maceration around the wound so I took him out of a total contact cast he says the drainage is actually better this week He is seen today in conjunction with HBO 1/21; returns to clinic. He was up in [place] for a day or 2 attending a funeral. He comes back in with the wound larger and with a large area of exposed bone. He had osteomyelitis and septic arthritis of the fifth left metatarsal head while he was in hospital. He received IV antibiotics in the hospital for a prolonged period of time then 3 weeks of Augmentin. Subsequently I gave him 2 weeks of doxycycline for more superficial wound infection. When I saw this last week the wound was smaller the surface of the wound looks satisfactory. 1/28; patient missed hyperbarics today. Bone biopsy I did last time showed Enterococcus faecalis and Staphylococcus lugdunensis . He has a wide area of exposed bone. We are going to use silver alginate as of today. I had another ethical discussion  with the patient. 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 In this situation I think the likelihood of healing this is low. Therefore I have recommended a ray amputation and with the patient's agreement I have referred him to Dr. Doran Durand. The other issue is that his compliance with hyperbarics has been minimal because of his work schedule and given his underlying decision I am going to stop this today READMISSION 10/24/2019 MRI 09/29/2019 left foot IMPRESSION: 1. Apparent skin ulceration inferior and lateral to the 5th metatarsal base with underlying heterogeneous T2  signal and enhancement in the subcutaneous fat. Small peripherally enhancing fluid collections along the plantar and lateral aspects of the 5th metatarsal base suspicious for abscesses. 2. Interval amputation through the mid 5th metatarsal with nonspecific low-level marrow edema and enhancement. Given the proximity to the adjacent soft tissue inflammatory changes, osteomyelitis cannot be excluded. 3. The additional bones appear unremarkable. MRI 09/29/2019 right foot IMPRESSION: 1. Soft tissue ulceration lateral to the 5th MTP joint. There is low-level T2 hyperintensity within the 4th and 5th metatarsal heads and adjacent proximal phalanges without abnormal T1 signal or cortical destruction. These findings are nonspecific and could be seen with early marrow edema, hyperemia or early osteomyelitis. No evidence of septic joint. 2. Mild tenosynovitis and synovial enhancement associated with the extensor digitorum tendons at the level of the midfoot. 3. Diffuse low-level muscular T2 hyperintensity and enhancement, most consistent with diabetic myopathy. LEFT FOOT BONE Methicillin resistant staphylococcus aureus Staphylococcus lugdunensis MIC MIC CIPROFLOXACIN >=8 RESISTANT Resistant <=0.5 SENSI... Sensitive CLINDAMYCIN <=0.25 SENS... Sensitive >=8 RESISTANT Resistant ERYTHROMYCIN >=8  RESISTANT Resistant >=8 RESISTANT Resistant GENTAMICIN <=0.5 SENSI... Sensitive <=0.5 SENSI... Sensitive Inducible Clindamycin NEGATIVE Sensitive NEGATIVE Sensitive OXACILLIN >=4 RESISTANT Resistant 2 SENSITIVE Sensitive RIFAMPIN <=0.5 SENSI... Sensitive <=0.5 SENSI... Sensitive TETRACYCLINE <=1 SENSITIVE Sensitive <=1 SENSITIVE Sensitive TRIMETH/SULFA <=10 SENSIT Sensitive <=10 SENSIT Sensitive ... Marland Kitchen.. VANCOMYCIN 1 SENSITIVE Sensitive <=0.5 SENSI... Sensitive Right foot bone . Component 3 wk ago Specimen Description BONE Special Requests RIGHT 4 METATARSAL SAMPLE B Gram Stain NO WBC SEEN NO ORGANISMS SEEN Culture RARE METHICILLIN RESISTANT STAPHYLOCOCCUS AUREUS NO ANAEROBES ISOLATED Performed at [facility], [place] [address], [place], [place] [address] Report Status 10/08/2019 FINAL Organism ID, Bacteria METHICILLIN RESISTANT STAPHYLOCOCCUS AUREUS Resulting Agency [facility] Susceptibility Methicillin resistant staphylococcus aureus MIC CIPROFLOXACIN >=8 RESISTANT Resistant CLINDAMYCIN <=0.25 SENS... Sensitive ERYTHROMYCIN >=8 RESISTANT Resistant GENTAMICIN <=0.5 SENSI... Sensitive Inducible Clindamycin NEGATIVE Sensitive OXACILLIN >=4 RESISTANT Resistant RIFAMPIN <=0.5 SENSI... Sensitive TETRACYCLINE <=1 SENSITIVE Sensitive TRIMETH/SULFA <=10 SENSIT Sensitive ... VANCOMYCIN 1 SENSITIVE Sensitive This is a patient we had in clinic earlier this year with a wound over his left fifth metatarsal head. 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 Doran Durand on 05/16/2019. He seems to have developed problems with wounds on his bilateral feet in June 2021 although he may have had problems earlier than  this. He was in an urgent care with a right foot ulcer on 09/26/2019 and given a course of doxycycline. This was apparently after having trouble getting into see orthopedics. He was seen by podiatry on 09/28/2019 noted to have bilateral lower extremity ulcers including the left lateral fifth metatarsal base and the right subfifth met head. It was noted that had purulent drainage at that time. He required hospitalization from 6/20 through 7/2. This was because of worsening right foot wounds. He underwent bilateral operative incision and drainage and bone biopsies bilaterally. Culture results are listed above. He has been referred back to clinic by Dr. Jacqualyn Posey of podiatry. He is also followed by Dr. Megan Salon who saw him yesterday. He was discharged from hospital on Zyvox Flagyl and Levaquin and yesterday changed to doxycycline Flagyl and Levaquin. His inflammatory markers on 6/26 showed a sedimentation rate of 129 and a C-reactive protein of 5. This  is improved to 14 and 1.3 respectively. This would indicate improvement. ABIs in our clinic today were 1.23 on the right and 1.20 on the left 11/01/2019 on evaluation today patient appears to be doing fairly well in regard to the wounds on his feet at this point. Fortunately there is no signs of active infection at this time. No fevers, chills, nausea, vomiting, or diarrhea. He currently is seeing infectious disease and still under their care at this point. Subsequently he also has both wounds which she has not been using collagen on as he did not receive that in his packaging he did not call us and let us know that. Apparently that just was missed on the order. Nonetheless we will get that straightened out today. 8/9-Patient returns for bilateral foot wounds, using Prisma with hydrogel moistened dressings, and the wounds appear stable. Patient using surgical shoes, avoiding much pressure or weightbearing as much as possible 8/16; patient has bilateral foot  wounds. 1 on the right lateral foot proximally the other is on the left mid lateral foot. Both required debridement of callus and thick skin around the wounds. We have been using silver collagen 8/27; patient has bilateral lateral foot wounds. The area on the left substantially surrounded by callus and dry skin. This was removed from the wound edge. The underlying wound is small. The area on the right measured somewhat smaller today. 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 Unfortunately I did not update his antibiotics during today's visit. 9/10 I [address] Dr. Hale Bogus last notes he felt he had completed antibiotics his inflammatory markers were reasonably well controlled. He has a small wound on the lateral left foot and a tiny area on the right which is just above closed. He is using Hydrofera Blue with border foam he has bilateral surgical shoes 9/24; 2 week f/u. doing well. right foot is closed. left foot still undermined. 10/14; right foot remains closed at the fifth met head. The area over the base of the left fifth metatarsal has a small open area but considerable undermining towards the plantar foot. Thick callus skin around this suggests an adequate pressure relief. We have talked about this. He says he is going to go back into his cam boot. I suggested a total contact cast he did not seem enamored with this suggestion 10/26; left foot base of the fifth metatarsal. Same condition as last time. He has skin over the area with an open wound however the skin is not adherent. He went to see Dr. Earleen Newport who did an x-ray and culture of his foot I have not reviewed the x-ray but the patient was not told anything. He is on doxycycline 11/11; since the patient was last here he was in the emergency room on 10/30 he was concerned about swelling in the left foot. They did not do any cultures or x-rays. They changed his antibiotics to  cephalexin. Previous culture showed group B strep. The cephalexin is appropriate as doxycycline has less than predictable coverage. Arrives in clinic today with swelling over this area under the wound. He also has a new wound on the right fifth metatarsal head 11/18; the patient has a difficult wound on the lateral aspect of the left fifth metatarsal head. The wound was almost ballotable last week I opened it slightly expecting to see purulence however there was just bleeding. I cultured this this was negative. X-ray unchanged. We are trying to get an  MRI but I am not sure were going to be able to get this through his insurance. He also has an area on the right lateral fifth metatarsal head this looks healthier 12/3; the patient finally got our MRI. Surprisingly this did not show osteomyelitis. I did show the soft tissue ulceration at the lateral plantar aspect of the fifth metatarsal base with a tiny residual 6 mm abscess overlying the superficial fascia I have tried to culture this area I have not been able to get this to grow anything. Nevertheless the protruding tissue looks aggravated. I suspect we should try to treat the underlying "abscess with broad-spectrum antibiotics. I am going to start him on Levaquin and Flagyl. He has much less edema in his legs and I am going to continue to wrap his legs and see him weekly 12/10. I started Levaquin and Flagyl on him last week. He just picked up the Flagyl apparently there was some delay. The worry is the wound on the left fifth metatarsal base which is substantial and worsening. His foot looks like he inverts at the ankle making this a weightbearing surface. Certainly no improvement in fact I think the measurements of this are somewhat worse. We have been using 12/17; he apparently just got the Levaquin yesterday this is 2 weeks after the fact. He has completed the Flagyl. The area over the left fifth metatarsal base still has protruding granulation  tissue although it does not look quite as bad as it did some weeks ago. He has severe bilateral lymphedema although we have not been treating him for wounds on his legs this is definitely going to require compression. There was so much edema in the left I did not wish to put him in a total contact cast today. I am going to increase his compression from 3-4 layer. The area on the right lateral fifth met head actually look quite good and superficial. 12/23; patient arrived with callus on the right fifth met head and the substantial hyper granulated callused wound on the base of his fifth metatarsal. He says he is completing his Levaquin in 2 days but I do not think that adds up with what I gave him but I will have to double check this. We are using Hydrofera Blue on both areas. My plan is to put the left leg in a cast the week after New Year's 04/06/2020; patient's wounds about the same. Right lateral fifth metatarsal head and left lateral foot over the base of the fifth metatarsal. There is undermining on the left lateral foot which I removed before application of total contact cast continuing with Hydrofera Blue new. Patient tells me he was seen by endocrinology today lab work was done [Dr. Kerr]. Also wondering whether he was referred to cardiology. I went over some lab work from previously does not have chronic renal failure certainly not nephrotic range proteinuria he does have very poorly controlled diabetes but this is not his most updated lab work. Hemoglobin A1c has been over 11 1/10; the patient had a considerable amount of leakage towards mid part of his left foot with macerated skin however the wound surface looks better the area on the right lateral fifth met head is better as well. I am going to change the dressing on the left foot under the total contact cast to silver alginate, continue with Hydrofera Blue on the right. 1/20; patient was in the total contact cast for 10 days. Considerable  amount of drainage although the skin  around the wound does not look too bad on the left foot. The area on the right fifth metatarsal head is closed. Our nursing staff reports large amount of drainage out of the left lateral foot wound 1/25; continues with copious amounts of drainage described by our intake staff. PCR culture I did last week showed E. coli and Enterococcus faecalis and low quantities. Multiple resistance genes documented including extended spectrum beta lactamase, MRSA, MRSE, quinolone, tetracycline. The wound is not quite as good this week as it was 5 days ago but about the same size 2/3; continues with copious amounts of malodorous drainage per our intake nurse. The PCR culture I did 2 weeks ago showed E. coli and low quantities of Enterococcus. There were multiple resistance genes detected. I put Neosporin on him last week although this does not seem to have helped. The wound is slightly deeper today. Offloading continues to be an issue here although with the amount of drainage she has a total contact cast is just not going to work 2/10; moderate amount of drainage. Patient reports he cannot get his stocking on over the dressing. I told him we have to do that the nurse gave him suggestions on how to make this work. The wound is on the bottom and lateral part of his left foot. Is cultured predominantly grew low amounts of Enterococcus, E. coli and anaerobes. There were multiple resistance genes detected including extended spectrum beta lactamase, quinolone, tetracycline. I could not think of an easy oral combination to address this so for now I am going to do topical antibiotics provided by [facility] I think the main agents here are vancomycin and an aminoglycoside. We have to be able to give him access to the wounds to get the topical antibiotic on 2/17; moderate amount of drainage this is unchanged. He has his Keystone topical antibiotic against the deep tissue culture organisms. He  has been using this and changing the dressing daily. Silver alginate on the wound surface. 2/24; using Keystone antibiotic with silver alginate on the top. He had too much drainage for a total contact cast at one point although I think that is improving and I think in the next week or 2 it might be possible to replace a total contact cast I did not do this today. In general the wound surface looks healthy however he continues to have thick rims of skin and subcutaneous tissue around the wide area of the circumference which I debrided 06/04/2020 upon evaluation today patient appears to be doing well in regard to his wound. I do feel like he is showing signs of improvement. There is little bit of callus and dead tissue around the edges of the wound as well as what appears to be a little bit of a sinus tract that is off to the side laterally I would perform debridement to clear that away today. 3/17; left lateral foot. The wound looks about the same as I remember. Not much depth surface looks healthy. No evidence of infection 3/25; left lateral foot. Wound surface looks about the same. Separating epithelium from the circumference. There really is no evidence of infection here however not making progress by my view 3/29; left lateral foot. Surface of the wound again looks reasonably healthy still thick skin and subcutaneous tissue around the wound margins. There is no evidence of infection. One of the concerns being brought up by the nurses has again the amount of drainage vis--vis continued use of a total contact cast 4/5;  left lateral foot at roughly the base of the fifth metatarsal. Nice healthy looking granulated tissue with rims of epithelialization. The overall wound measurements are not any better but the tissue looks healthy. The only concern is the amount of drainage although he has no surrounding maceration with what we have been doing recently to absorb fluid and protect his skin. He also has  lymphedema. 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 He states that the kerlix/coban under the TCC rolled down and could not get it back up. He has been using an offloading scooter and has somehow hurt his right foot using this device. This happened last week. He states that the side of his right foot developed a blister and opened. The top of his foot also has a few small open wounds he thinks is due to his socks rubbing in his shoes. He has not been using any dressings to the wound. He denies purulent drainage, fever/chills or erythema to the wounds. 4/22; patient presents for 1 week follow-up. He developed new wounds to the right foot that were evaluated at last clinic visit. He continues to have a total contact cast to the left leg and he reports no issues. He has been using silver collagen to the right foot wounds with no issues. He denies purulent drainage, fever/chills or erythema to the right foot wounds. He has no complaints today 4/25; patient presents for 1 week follow-up. He has a total contact cast of the left leg and reports no issues. He has been using silver alginate to the right foot wound. He denies purulent drainage, fever/chills or erythema to the right foot wounds. 5/2 patient presents for 1 week follow-up. T contact cast on the left. The wound which is on the base of the plantar foot at the base of the fifth metatarsal otal actually looks quite good and dimensions continue to gradually contract. HOWEVER the area on the right lateral fifth metatarsal head is much larger than what I remember from 2 weeks ago. Once more is he has significant levels of hypergranulation. Noteworthy that he had this same hyper granulated response on his  wound on the left foot at one point in time. So much so that he I thought there was an underlying fluid collection. Based on this I think this just needs debridement. 5/9; the wound on the left actually continues to be gradually smaller with a healthy surface. Slight amount of drainage and maceration of the skin around but not too bad. However he has a large wound over the right fifth metatarsal head very much in the same configuration as his left foot wound was initially. I used silver nitrate to address the hyper granulated tissue no mechanical debridement 5/16; area on the left foot did not look as healthy this week deeper thick surrounding macerated skin and subcutaneous tissue. The area on the right foot fifth met head was about the same The area on the right ankle that we identified last week is completely broken down into an open wound presumably a stocking rubbing issue 5/23; patient has been using a total contact cast to the left side. He has been using silver alginate underneath. He has also been using silver alginate to the right  foot wounds. He has no complaints today. He denies any signs of infection. 5/31; the left-sided wound looks some better measure smaller surface granulation looks better. We have been using silver alginate under the total contact cast The large area on his right fifth met head and right dorsal foot look about the same still using silver alginate 6/6; neither side is good as I was hoping although the surface area dimensions are better. A lot of maceration on his left and right foot around the wound edge. Area on the dorsal right foot looks better. He says he was traveling. I am not sure what does the amount of maceration around the plantar wounds may be drainage issues 6/13; in general the wound surfaces look quite good on both sides. Macerated skin and raised edges around the wound required debridement although in general especially on the left the surface area  seems improved. The area on the right dorsal ankle is about the same I thought this would not be such a problem to close 6/20; not much change in either wound although the one on the right looks a little better. Both wounds have thick macerated edges to the skin requiring debridements. We have been using silver alginate. The area on the dorsal right ankle is still open I thought this would be closed. 6/28; patient comes in today with a marked deterioration in the right foot wound fifth met head. Wide area of exposed bone this is a drastic change from last time. The area on the left there we have been casting is stagnant. We have been using silver alginate in both wound areas. 7/5; bone culture I did for PCR last time was positive for Pseudomonas, group B strep, Enterococcus and Staph aureus. There was no suggestion of methicillin resistance or ampicillin resistant genes. This was resistant to tetracycline however He comes into the clinic today with the area over his right plantar fifth metatarsal head which had been doing so well 2 weeks ago completely necrotic feeling bone. I do not know that this is going to be salvageable. The left foot wound is certainly no smaller but it has a better surface and is superficial. 7/8; patient called in this morning to say that his total contact cast was rubbing against his foot. He states he is doing fine overall. He denies signs of infection. 7/12; continued deterioration in the wound over the right fifth metatarsal head crumbling bone. This is not going to be salvageable. The patient agrees and wants to be referred to Dr. Doran Durand which we will attempt to arrange as soon as possible. I am going to continue him on antibiotics as long as that takes so I will renew those today. The area on the left foot which is the base of the fifth metatarsal continues to look somewhat better. Healthy looking tissue no depth no debridement is necessary here. 7/20; the patient was  kindly seen by Dr. Doran Durand of orthopedics on 10/19/2020. 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 Towards this end we have taken him out of the total contact cast on the left we will put him in a wrap with Hydrofera Blue. As I understand things surgery is planned for 7/21 7/27; patient had his surgery last Thursday. 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 Apparently everything went well we did not still disturb that today The area on the left foot  actually looks quite good. He has been much less mobile which probably explains this he did not seem to do well in the total contact cast secondary to drainage and maceration I think. 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 Fortunately there is no evidence of active infection at this time. No fevers, chills, nausea, vomiting, or diarrhea. Overall I think that he is actually doing extremely well. Nonetheless I do believe that he is staying off of this more following the surgery in his right foot that is the reason the left is doing so great. 8/16; left plantar foot wound. This looks smaller than the last time I saw this he is using Hydrofera Blue. The surgical wound on the right foot is being followed by Dr. Doran Durand we did not look at this today. He has surgical shoes on both feet 8/23; left plantar foot wound not as good this week. Surrounding macerated skin and subcutaneous tissue everything looks moist and wet. I do not think he is offloading this adequately. He is using a surgical shoe Apparently the right foot surgical wound is not open although I did not check his foot 8/31; left plantar foot lateral aspect. Much improved this week. He has no maceration. Some improvement in the surface area of the wound but most impressively the depth is come in we  are using silver alginate. The patient is a Product/process development scientist. He is asked that we write him a letter so he can go back to work. I have also tried to see if we can write something that will allow him to limit the amount of time that he is on his foot at work. 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 Hopefully the latter can be avoided 9/6; I believe the patient missed an appointment last week. He arrives in today with a wound looking roughly the same certainly no better. Undermining laterally and also inferiorly. We used molecuLight today in training with the patient's permission.. We are using silver alginate 9/21 wound is measuring bigger this week although this may have to do with the aggressive circumferential debridement last week in response to the blush fluorescence on the MolecuLight. Culture I did last week showed significant MSSA and E. coli. I put him on Augmentin but he has not started it yet. We are also going to send this for compounded antibiotics at [facility]. There is no evidence of systemic infection Electronic Signature(s) Signed: 12/23/2020 4:56:16 PM By: [name] MD Entered By: [name] on 12/23/2020 08:16:34 -------------------------------------------------------------------------------- Physical Exam Details Patient Name: Date of Service: [name], [name] 12/23/2020 7:30 A M Medical Record Number: [id number] Patient Account Number: [id number] Date of Birth/Sex: Treating RN: [birth year] (35 y.o. [name] Primary Care Provider: [name] Other Clinician: Referring Provider: Treating Provider/Extender: [name] in Treatment: 60 Constitutional Sitting or standing Blood Pressure is within target range for patient.. Pulse regular and within target range for patient.Marland Kitchen Respirations regular, non-labored and within target range.. Temperature  is normal and within the target range for the patient.Marland Kitchen Appears in no distress. Integumentary (Hair, Skin) No surrounding erythema or tenderness. Notes Wound exam; left plantar foot at the base of the fifth metatarsal. Large wound however even under illumination this week the wound surface looks satisfactory no  evidence of surrounding infection. No palpable tenderness. Pedal pulses palpable Electronic Signature(s) Signed: 12/23/2020 4:56:16 PM By: [name] MD Entered By: [name] on 12/23/2020 08:17:37 -------------------------------------------------------------------------------- Physician Orders Details Patient Name: Date of Service: [name], [name] 12/23/2020 7:30 A M Medical Record Number: [id number] Patient Account Number: [id number] Date of Birth/Sex: Treating RN: 1985/07/24 (35 y.o. [name] Primary Care Provider: [name] Other Clinician: Referring Provider: Treating Provider/Extender: [name] in Treatment: 22 Verbal / Phone Orders: No Diagnosis Coding ICD-10 Coding Code Description E11.621 Type 2 diabetes mellitus with foot ulcer L97.528 Non-pressure chronic ulcer of other part of left foot with other specified severity Follow-up Appointments ppointment in 1 week. - with Dr. Dellia Nims Return A Edema Control - Lymphedema / SCD / Other Bilateral Lower Extremities Elevate legs to the level of the heart or above for 30 minutes daily and/or when sitting, a frequency of: - throughout the day Avoid standing for long periods of time. Exercise regularly Moisturize legs daily. - right leg every night before bed. Compression stocking or Garment 20-30 mm/Hg pressure to: - Apply to right leg in the morning and remove at night. Off-Loading DH Walker Boot to: - Left Foot Additional Orders / Instructions Follow Nutritious Diet Wound Treatment Wound #3 - Foot Wound Laterality: Left, Lateral Cleanser: Soap and Water Every Other  Day/30 Days Discharge Instructions: May shower and wash wound with dial antibacterial soap and water prior to dressing change. Cleanser: Wound Cleanser (DME) (Generic) Every Other Day/30 Days Discharge Instructions: Cleanse the wound with wound cleanser prior to applying a clean dressing using gauze sponges, not tissue or cotton balls. Peri-Wound Care: Zinc Oxide Ointment 30g tube Every Other Day/30 Days Discharge Instructions: Apply Zinc Oxide to periwound with each dressing change as needed. Prim Dressing: KerraCel Ag Gelling Fiber Dressing, 4x5 in (silver alginate) (DME) (Generic) Every Other Day/30 Days ary Discharge Instructions: Apply silver alginate to wound bed as instructed Secondary Dressing: Woven Gauze Sponge, Non-Sterile 4x4 in (DME) (Generic) Every Other Day/30 Days Discharge Instructions: Apply over primary dressing as directed. Secondary Dressing: Zetuvit Plus 4x8 in (DME) (Generic) Every Other Day/30 Days Discharge Instructions: Apply over primary dressing as directed. Secondary Dressing: Optifoam Non-Adhesive Dressing, 4x4 in (DME) (Generic) Every Other Day/30 Days Discharge Instructions: Apply over primary dressing as directed. Secured With: Coban Self-Adherent Wrap 4x5 (in/yd) (DME) (Generic) Every Other Day/30 Days Discharge Instructions: Secure with Coban as directed. Secured With: [employer], 4.5x3.1 (in/yd) (DME) (Generic) Every Other Day/30 Days Discharge Instructions: Secure with Kerlix as directed. Secured With: 102M Medipore H Soft Cloth Surgical T 4 x 2 (in/yd) (DME) (Generic) Every Other Day/30 Days ape Discharge Instructions: Secure dressing with tape as directed. Electronic Signature(s) Signed: 12/23/2020 4:56:16 PM By: [name] MD Signed: 12/23/2020 5:56:57 PM By: [name] RN, BSN Entered By: [name] on 12/23/2020 10:39:05 -------------------------------------------------------------------------------- Problem List Details Patient  Name: Date of Service: [name], [name] 12/23/2020 7:30 A M Medical Record Number: [id number] Patient Account Number: [id number] Date of Birth/Sex: Treating RN: October 11, 1985 (35 y.o. [name] Primary Care Provider: [name] Other Clinician: Referring Provider: Treating Provider/Extender: [name] in Treatment: 68 Active Problems ICD-10 Encounter Code Description Active Date MDM Diagnosis E11.621 Type 2 diabetes mellitus with foot ulcer 10/24/2019 No Yes L97.528 Non-pressure chronic ulcer of other part of left foot with other specified 10/24/2019 No Yes severity Inactive Problems ICD-10 Code Description Active Date Inactive Date L97.518 Non-pressure chronic  ulcer of other part of right foot with other specified severity 10/24/2019 10/24/2019 L97.518 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ity 07/14/2020 07/14/2020 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, right ankle and foot 10/24/2019 10/24/2019 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 08/10/2020 08/10/2020 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, left ankle and foot 10/24/2019 10/24/2019 B95.62 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 10/24/2019 10/24/2019 classified elsewhere Resolved Problems Electronic Signature(s) Signed: 12/23/2020 4:56:16 PM By: [name] MD Entered By: [name] on 12/23/2020 08:08:49 -------------------------------------------------------------------------------- Progress Note Details Patient Name: Date of Service: [name], [name] 12/23/2020 7:30 A M Medical Record Number: [id number] Patient Account Number: [id number] Date of Birth/Sex: Treating RN: July 17, 1985 (35 y.o. 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 He comes in for review of a wound over the plantar fifth metatarsal head extending into the lateral part of the foot. He was followed for this previously by his podiatrist Dr. Cornelius Moras. As the patient tells his story he went to see podiatry first for a swelling he developed on the lateral part of his fifth metatarsal head in May. He states this was "open" by podiatry and the area closed. He was followed up in June and it was again opened callus removed and it closed promptly. There were plans being made for surgery on the fifth metatarsal head in June however his blood sugar was apparently too high for anesthesia. Apparently the area was debrided and opened again in June and it is never closed since. Looking over the records from podiatry I am really not able to follow this. It was clear when he was first seen it was before 5/14 at that point he already had a wound. By 5/17 the ulcer was resolved. I do not see anything about a procedure. On 5/28 noted to have pre-ulcerative moderate keratosis. X-ray noted 1/5 contracted toe and tailor's bunion and metatarsal deformity. On a visit date on 09/28/2018 the dorsal part of the left foot it healed and resolved. There was concern about swelling in his lower extremity he was sent to the ER.. As far as I can tell he was seen in the ER on 7/12 with an ulcer on his left foot. A DVT rule out of the left leg was negative. I do not think I have complete records from podiatry but I am not able to verify the procedures this patient states he had. He states after the last procedure the wound has never closed although I am not able to follow this in the records I have from podiatry. He has not had a recent x-ray The patient has been using Neosporin on the wound. He is wearing a Darco shoe. He is still very active up on his  foot working and exercising. Past medical history; type 2 diabetes ketosis-prone, leg swelling with a negative DVT study in July. Non-smoker ABI in our clinic was 0.85 on the left 10/16; substantial wound on the plantar left fifth met head extending laterally almost to the dorsal fifth MTP. We have been using silver alginate we gave him a Darco forefoot off loader. An x-ray did not show evidence of osteomyelitis did note soft tissue emphysema which I think was due to gas tracking  through an open wound. There is no doubt in my mind he requires an MRI 10/23; MRI not booked until 3 November at the earliest this is largely due to his glucose sensor in the right arm. We have been using silver alginate. There has been an improvement 10/29; I am still not exactly sure when his MRI is booked for. He says it is the third but it is the 10th in epic. This definitely needs to be done. He is running a low-grade fever today but no other symptoms. No real improvement in the 1 02/26/2019 patient presents today for a follow-up visit here in our clinic he is last been seen in the clinic on October 29. Subsequently we were working on getting MRI to evaluate and see what exactly was going on and where we would need to go from the standpoint of whether or not he had osteomyelitis and again what treatments were going be required. Subsequently the patient ended up being admitted to the hospital on 02/07/2019 and was discharged on 02/14/2019. This is a somewhat interesting admission with a discharge diagnosis of pneumonia due to COVID-19 although he was positive for COVID-19 when tested at the urgent care but negative x2 when he was actually in the hospital. With that being said he did have acute respiratory failure with hypoxia and it was noted he also have a left foot ulceration with osteomyelitis. With that being said he did require oxygen for his pneumonia and I level 4 L. He was placed on antivirals and steroids for the  COVID-19. He was also transferred to the [place] at one point. Nonetheless he did subsequently discharged home and since being home has done much better in that regard. The CT angiogram did not show any pulmonary embolism. With regard to the osteomyelitis the patient was placed on vancomycin and Zosyn while in the hospital but has been changed to Augmentin at discharge. It was also recommended that he follow- up with wound care and podiatry. Podiatry however wanted him to see [name] according to the patient prior to them doing anything further. His hemoglobin A1c was 9.9 as noted in the hospital. Have an MRI of the left foot performed while in the hospital on 02/04/2019. 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 There is an overlying plantar open wound noted an abscess tracking back along the lateral aspect of the fifth metatarsal shaft. There is otherwise diffuse cellulitis and mild fasciitis without findings of polymyositis. The patient did have recently pneumonia secondary to COVID-19 I looked in the chart through epic and it does appear that the patient may need to have an additional x-ray just to ensure everything is cleared and that he has no airspace disease prior to putting him into the [name]. 03/05/2019; patient was readmitted to the clinic last week. He was hospitalized twice for a viral upper respiratory tract infection from 11/1 through 11/4 and then 11/5 through 11/12 ultimately this turned out to be Covid pneumonitis. Although he was discharged on oxygen he is not using it. He says he feels fine. He has no exercise limitation no cough no sputum. His O2 sat in our clinic today was 100% on room air. 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 He received Vanco and Zosyn in  the hospital and then was  discharged on 2 weeks of Augmentin. I do not see any relevant cultures. He was supposed to follow-up with infectious disease but I do not see that he has an appointment. 12/8; patient saw Dr. Novella Olive of infectious disease last week. He felt that he had had adequate antibiotic therapy. He did not go to follow-up with Dr. Amalia Hailey of podiatry and I have again talked to him about the pros and cons of this. He does not want to consider a ray amputation of this time. He is aware of the risks of recurrence, migration etc. He started HBO today and tolerated this well. He can complete the Augmentin that I gave him last week. I have looked over the lab work that Dr. Chana Bode ordered his C-reactive protein was 3.3 and his sedimentation rate was 17. The C-reactive protein is never really been measurably that high in this patient 12/15; not much change in the wound today however he has undermining along the lateral part of the foot again more extensively than last week. He has some rims of epithelialization. We have been using silver alginate. He is undergoing hyperbarics but did not dive today 12/18; in for his obligatory first total contact cast change. Unfortunately there was pus coming from the undermining area around his fifth metatarsal head. This was cultured but will preclude reapplication of a cast. He is seen in conjunction with HBO 12/24; patient had staph lugdunensis in the wound in the undermining area laterally last time. We put him on doxycycline which should have covered this. The wound looks better today. I am going to give him another week of doxycycline before reattempting the total contact cast 12/31; the patient is completing antibiotics. Hemorrhagic debris in the distal part of the wound with some undermining distally. He also had hyper granulation. Extensive debridement with a #5 curette. The infected area that was on the lateral part of the fifth met head is closed  over. I do not think he needs any more antibiotics. Patient was seen prior to HBO. Preparations for a total contact cast were made in the cast will be placed post hyperbarics 04/11/19; once again the patient arrives today without complaint. He had been in a cast all week noted that he had heavy drainage this week. This resulted in large raised areas of macerated tissue around the wound 1/14; wound bed looks better slightly smaller. Hydrofera Blue has been changing himself. He had a heavy drainage last week which caused a lot of maceration around the wound so I took him out of a total contact cast he says the drainage is actually better this week He is seen today in conjunction with HBO 1/21; returns to clinic. He was up in [place] for a day or 2 attending a funeral. He comes back in with the wound larger and with a large area of exposed bone. He had osteomyelitis and septic arthritis of the fifth left metatarsal head while he was in hospital. He received IV antibiotics in the hospital for a prolonged period of time then 3 weeks of Augmentin. Subsequently I gave him 2 weeks of doxycycline for more superficial wound infection. When I saw this last week the wound was smaller the surface of the wound looks satisfactory. 1/28; patient missed hyperbarics today. Bone biopsy I did last time showed Enterococcus faecalis and Staphylococcus lugdunensis . He has a wide area of exposed bone. We are going to use silver alginate as of today. I had another ethical discussion with the patient. 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 In this situation I think the likelihood of healing this is low. Therefore I have recommended a ray amputation and with the patient's agreement I have referred him to Dr. Doran Durand. The other issue is that his compliance with hyperbarics has been minimal because of his work schedule and given his underlying decision I am going to stop this  today READMISSION 10/24/2019 MRI 09/29/2019 left foot IMPRESSION: 1. Apparent skin ulceration inferior and lateral to the 5th metatarsal base with underlying heterogeneous T2 signal and enhancement in the subcutaneous fat. Small peripherally enhancing fluid collections along the plantar and lateral aspects of the 5th metatarsal base suspicious for abscesses. 2. Interval amputation through the mid 5th metatarsal with nonspecific low-level marrow edema and enhancement. Given the proximity to the adjacent soft tissue inflammatory changes, osteomyelitis cannot be excluded. 3. The additional bones appear unremarkable. MRI 09/29/2019 right foot IMPRESSION: 1. Soft tissue ulceration lateral to the 5th MTP joint. There is low-level T2 hyperintensity within the 4th and 5th metatarsal heads and adjacent proximal phalanges without abnormal T1 signal or cortical destruction. These findings are nonspecific and could be seen with early marrow edema, hyperemia or early osteomyelitis. No evidence of septic joint. 2. Mild tenosynovitis and synovial enhancement associated with the extensor digitorum tendons at the level of the midfoot. 3. Diffuse low-level muscular T2 hyperintensity and enhancement, most consistent with diabetic myopathy. LEFT FOOT BONE Methicillin resistant staphylococcus aureus Staphylococcus lugdunensis MIC MIC CIPROFLOXACIN >=8 RESISTANT Resistant <=0.5 SENSI... Sensitive CLINDAMYCIN <=0.25 SENS... Sensitive >=8 RESISTANT Resistant ERYTHROMYCIN >=8 RESISTANT Resistant >=8 RESISTANT Resistant GENTAMICIN <=0.5 SENSI... Sensitive <=0.5 SENSI... Sensitive Inducible Clindamycin NEGATIVE Sensitive NEGATIVE Sensitive OXACILLIN >=4 RESISTANT Resistant 2 SENSITIVE Sensitive RIFAMPIN <=0.5 SENSI... Sensitive <=0.5 SENSI... Sensitive TETRACYCLINE <=1 SENSITIVE Sensitive <=1 SENSITIVE Sensitive TRIMETH/SULFA <=10 SENSIT Sensitive <=10 SENSIT Sensitive ... Marland Kitchen.. VANCOMYCIN 1 SENSITIVE  Sensitive <=0.5 SENSI... Sensitive Right foot bone . Component 3 wk ago Specimen Description BONE Special Requests RIGHT 4 METATARSAL SAMPLE B Gram Stain NO WBC SEEN NO ORGANISMS SEEN Culture RARE METHICILLIN RESISTANT STAPHYLOCOCCUS AUREUS NO ANAEROBES ISOLATED Performed at [facility], [place] [address] [place], [place] [address] Report Status 10/08/2019 FINAL Organism ID, Bacteria METHICILLIN RESISTANT STAPHYLOCOCCUS AUREUS Resulting Agency [facility] Susceptibility Methicillin resistant staphylococcus aureus MIC CIPROFLOXACIN >=8 RESISTANT Resistant CLINDAMYCIN <=0.25 SENS... Sensitive ERYTHROMYCIN >=8 RESISTANT Resistant GENTAMICIN <=0.5 SENSI... Sensitive Inducible Clindamycin NEGATIVE Sensitive OXACILLIN >=4 RESISTANT Resistant RIFAMPIN <=0.5 SENSI... Sensitive TETRACYCLINE <=1 SENSITIVE Sensitive TRIMETH/SULFA <=10 SENSIT Sensitive ... VANCOMYCIN 1 SENSITIVE Sensitive This is a patient we had in clinic earlier this year with a wound over his left fifth metatarsal head. 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 Doran Durand on 05/16/2019. He seems to have developed problems with wounds on his bilateral feet in June 2021 although he may have had problems earlier than this. He was in an urgent care with a right foot ulcer on 09/26/2019 and given a course of doxycycline. This was apparently after having trouble getting into see orthopedics. He was seen by podiatry on 09/28/2019 noted to have bilateral lower extremity ulcers including the left lateral fifth metatarsal base and the right subfifth met head. It was noted that had purulent drainage at  that time. He required hospitalization from 6/20 through  7/2. This was because of worsening right foot wounds. He underwent bilateral operative incision and drainage and bone biopsies bilaterally. Culture results are listed above. He has been referred back to clinic by Dr. Jacqualyn Posey of podiatry. He is also followed by Dr. Megan Salon who saw him yesterday. He was discharged from hospital on Zyvox Flagyl and Levaquin and yesterday changed to doxycycline Flagyl and Levaquin. His inflammatory markers on 6/26 showed a sedimentation rate of 129 and a C-reactive protein of 5. This is improved to 14 and 1.3 respectively. This would indicate improvement. ABIs in our clinic today were 1.23 on the right and 1.20 on the left 11/01/2019 on evaluation today patient appears to be doing fairly well in regard to the wounds on his feet at this point. Fortunately there is no signs of active infection at this time. No fevers, chills, nausea, vomiting, or diarrhea. He currently is seeing infectious disease and still under their care at this point. Subsequently he also has both wounds which she has not been using collagen on as he did not receive that in his packaging he did not call us and let us know that. Apparently that just was missed on the order. Nonetheless we will get that straightened out today. 8/9-Patient returns for bilateral foot wounds, using Prisma with hydrogel moistened dressings, and the wounds appear stable. Patient using surgical shoes, avoiding much pressure or weightbearing as much as possible 8/16; patient has bilateral foot wounds. 1 on the right lateral foot proximally the other is on the left mid lateral foot. Both required debridement of callus and thick skin around the wounds. We have been using silver collagen 8/27; patient has bilateral lateral foot wounds. The area on the left substantially surrounded by callus and dry skin. This was removed from the wound edge. The underlying wound is small. The area on the right measured somewhat smaller today. We've  been using silver collagen the patient was on antibiotics for underlying osteomyelitis in the left foot. Unfortunately I did not update his antibiotics during today's visit. 9/10 I reviewed Dr. Hale Bogus last notes he felt he had completed antibiotics his inflammatory markers were reasonably well controlled. He has a small wound on the lateral left foot and a tiny area on the right which is just above closed. He is using Hydrofera Blue with border foam he has bilateral surgical shoes 9/24; 2 week f/u. doing well. right foot is closed. left foot still undermined. 10/14; right foot remains closed at the fifth met head. The area over the base of the left fifth metatarsal has a small open area but considerable undermining towards the plantar foot. Thick callus skin around this suggests an adequate pressure relief. We have talked about this. He says he is going to go back into his cam boot. I suggested a total contact cast he did not seem enamored with this suggestion 10/26; left foot base of the fifth metatarsal. Same condition as last time. He has skin over the area with an open wound however the skin is not adherent. He went to see Dr. Earleen Newport who did an x-ray and culture of his foot I have not reviewed the x-ray but the patient was not told anything. He is on doxycycline 11/11; since the patient was last here he was in the emergency room on 10/30 he was concerned about swelling in the left foot. They did not do any cultures or x-rays. They changed his antibiotics to cephalexin.  Previous culture showed group B strep. The cephalexin is appropriate as doxycycline has less than predictable coverage. Arrives in clinic today with swelling over this area under the wound. He also has a new wound on the right fifth metatarsal head 11/18; the patient has a difficult wound on the lateral aspect of the left fifth metatarsal head. The wound was almost ballotable last week I opened it slightly expecting to see  purulence however there was just bleeding. I cultured this this was negative. X-ray unchanged. We are trying to get an MRI but I am not sure were going to be able to get this through his insurance. He also has an area on the right lateral fifth metatarsal head this looks healthier 12/3; the patient finally got our MRI. Surprisingly this did not show osteomyelitis. I did show the soft tissue ulceration at the lateral plantar aspect of the fifth metatarsal base with a tiny residual 6 mm abscess overlying the superficial fascia I have tried to culture this area I have not been able to get this to grow anything. Nevertheless the protruding tissue looks aggravated. I suspect we should try to treat the underlying "abscess with broad-spectrum antibiotics. I am going to start him on Levaquin and Flagyl. He has much less edema in his legs and I am going to continue to wrap his legs and see him weekly 12/10. I started Levaquin and Flagyl on him last week. He just picked up the Flagyl apparently there was some delay. The worry is the wound on the left fifth metatarsal base which is substantial and worsening. His foot looks like he inverts at the ankle making this a weightbearing surface. Certainly no improvement in fact I think the measurements of this are somewhat worse. We have been using 12/17; he apparently just got the Levaquin yesterday this is 2 weeks after the fact. He has completed the Flagyl. The area over the left fifth metatarsal base still has protruding granulation tissue although it does not look quite as bad as it did some weeks ago. He has severe bilateral lymphedema although we have not been treating him for wounds on his legs this is definitely going to require compression. There was so much edema in the left I did not wish to put him in a total contact cast today. I am going to increase his compression from 3-4 layer. The area on the right lateral fifth met head actually look quite good and  superficial. 12/23; patient arrived with callus on the right fifth met head and the substantial hyper granulated callused wound on the base of his fifth metatarsal. He says he is completing his Levaquin in 2 days but I do not think that adds up with what I gave him but I will have to double check this. We are using Hydrofera Blue on both areas. My plan is to put the left leg in a cast the week after New Year's 04/06/2020; patient's wounds about the same. Right lateral fifth metatarsal head and left lateral foot over the base of the fifth metatarsal. There is undermining on the left lateral foot which I removed before application of total contact cast continuing with Hydrofera Blue new. Patient tells me he was seen by endocrinology today lab work was done [Dr. Kerr]. Also wondering whether he was referred to cardiology. I went over some lab work from previously does not have chronic renal failure certainly not nephrotic range proteinuria he does have very poorly controlled diabetes but this is not his  most updated lab work. Hemoglobin A1c has been over 11 1/10; the patient had a considerable amount of leakage towards mid part of his left foot with macerated skin however the wound surface looks better the area on the right lateral fifth met head is better as well. I am going to change the dressing on the left foot under the total contact cast to silver alginate, continue with Hydrofera Blue on the right. 1/20; patient was in the total contact cast for 10 days. Considerable amount of drainage although the skin around the wound does not look too bad on the left foot. The area on the right fifth metatarsal head is closed. Our nursing staff reports large amount of drainage out of the left lateral foot wound 1/25; continues with copious amounts of drainage described by our intake staff. PCR culture I did last week showed E. coli and Enterococcus faecalis and low quantities. Multiple resistance genes documented  including extended spectrum beta lactamase, MRSA, MRSE, quinolone, tetracycline. The wound is not quite as good this week as it was 5 days ago but about the same size 2/3; continues with copious amounts of malodorous drainage per our intake nurse. The PCR culture I did 2 weeks ago showed E. coli and low quantities of Enterococcus. There were multiple resistance genes detected. I put Neosporin on him last week although this does not seem to have helped. The wound is slightly deeper today. Offloading continues to be an issue here although with the amount of drainage she has a total contact cast is just not going to work 2/10; moderate amount of drainage. Patient reports he cannot get his stocking on over the dressing. I told him we have to do that the nurse gave him suggestions on how to make this work. The wound is on the bottom and lateral part of his left foot. Is cultured predominantly grew low amounts of Enterococcus, E. coli and anaerobes. There were multiple resistance genes detected including extended spectrum beta lactamase, quinolone, tetracycline. I could not think of an easy oral combination to address this so for now I am going to do topical antibiotics provided by [facility] I think the main agents here are vancomycin and an aminoglycoside. We have to be able to give him access to the wounds to get the topical antibiotic on 2/17; moderate amount of drainage this is unchanged. He has his Keystone topical antibiotic against the deep tissue culture organisms. He has been using this and changing the dressing daily. Silver alginate on the wound surface. 2/24; using Keystone antibiotic with silver alginate on the top. He had too much drainage for a total contact cast at one point although I think that is improving and I think in the next week or 2 it might be possible to replace a total contact cast I did not do this today. In general the wound surface looks healthy however he continues to have  thick rims of skin and subcutaneous tissue around the wide area of the circumference which I debrided 06/04/2020 upon evaluation today patient appears to be doing well in regard to his wound. I do feel like he is showing signs of improvement. There is little bit of callus and dead tissue around the edges of the wound as well as what appears to be a little bit of a sinus tract that is off to the side laterally I would perform debridement to clear that away today. 3/17; left lateral foot. The wound looks about the same as I remember.  Not much depth surface looks healthy. No evidence of infection 3/25; left lateral foot. Wound surface looks about the same. Separating epithelium from the circumference. There really is no evidence of infection here however not making progress by my view 3/29; left lateral foot. Surface of the wound again looks reasonably healthy still thick skin and subcutaneous tissue around the wound margins. There is no evidence of infection. One of the concerns being brought up by the nurses has again the amount of drainage vis--vis continued use of a total contact cast 4/5; left lateral foot at roughly the base of the fifth metatarsal. Nice healthy looking granulated tissue with rims of epithelialization. The overall wound measurements are not any better but the tissue looks healthy. The only concern is the amount of drainage although he has no surrounding maceration with what we have been doing recently to absorb fluid and protect his skin. He also has lymphedema. 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 He states that the kerlix/coban under the TCC rolled down and could not get it back up. He has been using an offloading scooter and has  somehow hurt his right foot using this device. This happened last week. He states that the side of his right foot developed a blister and opened. The top of his foot also has a few small open wounds he thinks is due to his socks rubbing in his shoes. He has not been using any dressings to the wound. He denies purulent drainage, fever/chills or erythema to the wounds. 4/22; patient presents for 1 week follow-up. He developed new wounds to the right foot that were evaluated at last clinic visit. He continues to have a total contact cast to the left leg and he reports no issues. He has been using silver collagen to the right foot wounds with no issues. He denies purulent drainage, fever/chills or erythema to the right foot wounds. He has no complaints today 4/25; patient presents for 1 week follow-up. He has a total contact cast of the left leg and reports no issues. He has been using silver alginate to the right foot wound. He denies purulent drainage, fever/chills or erythema to the right foot wounds. 5/2 patient presents for 1 week follow-up. T contact cast on the left. The wound which is on the base of the plantar foot at the base of the fifth metatarsal otal actually looks quite good and dimensions continue to gradually contract. HOWEVER the area on the right lateral fifth metatarsal head is much larger than what I remember from 2 weeks ago. Once more is he has significant levels of hypergranulation. Noteworthy that he had this same hyper granulated response on his wound on the left foot at one point in time. So much so that he I thought there was an underlying fluid collection. Based on this I think this just needs debridement. 5/9; the wound on the left actually continues to be gradually smaller with a healthy surface. Slight amount of drainage and maceration of the skin around but not too bad. However he has a large wound over the right fifth metatarsal head very much in the same configuration  as his left foot wound was initially. I used silver nitrate to address the hyper  granulated tissue no mechanical debridement 5/16; area on the left foot did not look as healthy this week deeper thick surrounding macerated skin and subcutaneous tissue. oo The area on the right foot fifth met head was about the same oo The area on the right ankle that we identified last week is completely broken down into an open wound presumably a stocking rubbing issue 5/23; patient has been using a total contact cast to the left side. He has been using silver alginate underneath. He has also been using silver alginate to the right foot wounds. He has no complaints today. He denies any signs of infection. 5/31; the left-sided wound looks some better measure smaller surface granulation looks better. We have been using silver alginate under the total contact cast oo The large area on his right fifth met head and right dorsal foot look about the same still using silver alginate 6/6; neither side is good as I was hoping although the surface area dimensions are better. A lot of maceration on his left and right foot around the wound edge. Area on the dorsal right foot looks better. He says he was traveling. I am not sure what does the amount of maceration around the plantar wounds may be drainage issues 6/13; in general the wound surfaces look quite good on both sides. Macerated skin and raised edges around the wound required debridement although in general especially on the left the surface area seems improved. oo The area on the right dorsal ankle is about the same I thought this would not be such a problem to close 6/20; not much change in either wound although the one on the right looks a little better. Both wounds have thick macerated edges to the skin requiring debridements. We have been using silver alginate. The area on the dorsal right ankle is still open I thought this would be closed. 6/28; patient comes in  today with a marked deterioration in the right foot wound fifth met head. Wide area of exposed bone this is a drastic change from last time. The area on the left there we have been casting is stagnant. We have been using silver alginate in both wound areas. 7/5; bone culture I did for PCR last time was positive for Pseudomonas, group B strep, Enterococcus and Staph aureus. There was no suggestion of methicillin resistance or ampicillin resistant genes. This was resistant to tetracycline however He comes into the clinic today with the area over his right plantar fifth metatarsal head which had been doing so well 2 weeks ago completely necrotic feeling bone. I do not know that this is going to be salvageable. The left foot wound is certainly no smaller but it has a better surface and is superficial. 7/8; patient called in this morning to say that his total contact cast was rubbing against his foot. He states he is doing fine overall. He denies signs of infection. 7/12; continued deterioration in the wound over the right fifth metatarsal head crumbling bone. This is not going to be salvageable. The patient agrees and wants to be referred to Dr. Doran Durand which we will attempt to arrange as soon as possible. I am going to continue him on antibiotics as long as that takes so I will renew those today. The area on the left foot which is the base of the fifth metatarsal continues to look somewhat better. Healthy looking tissue no depth no debridement is necessary here. 7/20; the patient was kindly seen by Dr. Doran Durand of orthopedics  on 10/19/2020. 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 Towards this end we have taken him out of the total contact cast on the left we will put him in a wrap with Hydrofera Blue. As I understand things surgery is planned for 7/21 7/27; patient had his surgery last Thursday. He only had  the fifth ray amputation. Apparently everything went well we did not still disturb that today The area on the left foot actually looks quite good. He has been much less mobile which probably explains this he did not seem to do well in the total contact cast secondary to drainage and maceration I think. 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 Fortunately there is no evidence of active infection at this time. No fevers, chills, nausea, vomiting, or diarrhea. Overall I think that he is actually doing extremely well. Nonetheless I do believe that he is staying off of this more following the surgery in his right foot that is the reason the left is doing so great. 8/16; left plantar foot wound. This looks smaller than the last time I saw this he is using Hydrofera Blue. The surgical wound on the right foot is being followed by Dr. Doran Durand we did not look at this today. He has surgical shoes on both feet 8/23; left plantar foot wound not as good this week. Surrounding macerated skin and subcutaneous tissue everything looks moist and wet. I do not think he is offloading this adequately. He is using a surgical shoe Apparently the right foot surgical wound is not open although I did not check his foot 8/31; left plantar foot lateral aspect. Much improved this week. He has no maceration. Some improvement in the surface area of the wound but most impressively the depth is come in we are using silver alginate. The patient is a Product/process development scientist. He is asked that we write him a letter so he can go back to work. I have also tried to see if we can write something that will allow him to limit the amount of time that he is on his foot at work. 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 Hopefully the latter can be  avoided 9/6; I believe the patient missed an appointment last week. He arrives in today with a wound looking roughly the same certainly no better. Undermining laterally and also inferiorly. We used molecuLight today in training with the patient's permission.. We are using silver alginate 9/21 wound is measuring bigger this week although this may have to do with the aggressive circumferential debridement last week in response to the blush fluorescence on the MolecuLight. Culture I did last week showed significant MSSA and E. coli. I put him on Augmentin but he has not started it yet. We are also going to send this for compounded antibiotics at [facility]. There is no evidence of systemic infection Objective Constitutional Sitting or standing Blood Pressure is within target range for patient.. Pulse regular and within target range for patient.Marland Kitchen Respirations regular, non-labored and within target range.. Temperature is normal and within the target range for the patient.Marland Kitchen Appears in no distress. Vitals Time Taken: 7:53 AM, Height: 77 in, Weight: 280 lbs,  BMI: 33.2, Temperature: 98.9 F, Pulse: 86 bpm, Respiratory Rate: 18 breaths/min, Blood Pressure: 143/88 mmHg, Capillary Blood Glucose: 111 mg/dl. General Notes: glucose per pt report General Notes: Wound exam; left plantar foot at the base of the fifth metatarsal. Large wound however even under illumination this week the wound surface looks satisfactory no evidence of surrounding infection. No palpable tenderness. Pedal pulses palpable Integumentary (Hair, Skin) No surrounding erythema or tenderness. Wound #3 status is Open. Original cause of wound was Trauma. The date acquired was: 10/02/2019. The wound has been in treatment 60 weeks. The wound is located on the Left,Lateral Foot. The wound measures 3.3cm length x 3.1cm width x 0.8cm depth; 8.035cm^2 area and 6.428cm^3 volume. There is Fat Layer (Subcutaneous Tissue) exposed. There is no tunneling  noted, however, there is undermining starting at 5:00 and ending at 7:00 with a maximum distance of 1cm. There is a large amount of serosanguineous drainage noted. The wound margin is well defined and not attached to the wound base. There is large (67- 100%) red, pink granulation within the wound bed. There is a small (1-33%) amount of necrotic tissue within the wound bed including Adherent Slough. 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 - with Dr. Dellia Nims Edema Control - Lymphedema / SCD / Other: Elevate legs to the level of the heart or above for 30 minutes daily and/or when sitting, a frequency of: - throughout the day Avoid standing for long periods of time. Exercise regularly Moisturize legs daily. - right leg every night before bed. Compression stocking or Garment 20-30 mm/Hg pressure to: - Apply to right leg in the morning and remove at night. Off-Loading: DH Walker Boot to: - Left Foot Additional Orders / Instructions: Follow Nutritious Diet WOUND #3: - Foot Wound Laterality: Left, Lateral Cleanser: Soap and Water Every Other Day/30 Days Discharge Instructions: May shower and wash wound with dial antibacterial soap and water prior to dressing change. Cleanser: Wound Cleanser (DME) (Generic) Every Other Day/30 Days Discharge Instructions: Cleanse the wound with wound cleanser prior to applying a clean dressing using gauze sponges, not tissue or cotton balls. Peri-Wound Care: Zinc Oxide Ointment 30g tube Every Other Day/30 Days Discharge Instructions: Apply Zinc Oxide to periwound with each dressing change as needed. Prim Dressing: KerraCel Ag Gelling Fiber Dressing, 4x5 in (silver alginate) (DME) (Generic) Every Other Day/30 Days ary Discharge Instructions: Apply silver alginate to wound bed as  instructed Secondary Dressing: Woven Gauze Sponge, Non-Sterile 4x4 in (DME) (Generic) Every Other Day/30 Days Discharge Instructions: Apply over primary dressing as directed. Secondary Dressing: Zetuvit Plus 4x8 in (DME) (Generic) Every Other Day/30 Days Discharge Instructions: Apply over primary dressing as directed. Secondary Dressing: Optifoam Non-Adhesive Dressing, 4x4 in (DME) (Generic) Every Other Day/30 Days Discharge Instructions: Apply over primary dressing as directed. Secured With: Coban Self-Adherent Wrap 4x5 (in/yd) (DME) (Generic) Every Other Day/30 Days Discharge Instructions: Secure with Coban as directed. Secured With: [employer], 4.5x3.1 (in/yd) (DME) (Generic) Every Other Day/30 Days Discharge Instructions: Secure with Kerlix as directed. Secured With: 63M Medipore H Soft Cloth Surgical T 4 x 2 (in/yd) (DME) (Generic) Every Other Day/30 Days ape Discharge Instructions: Secure dressing with tape as directed. 1. I am continuing with silver alginate. 2. We will send the culture of the MSSA and E. coli for compounded antibiotic therapy. 3. No debridement was necessary today  as noted an extensive debridement last week. 4. We will probably repeat MolecuLight scanning next week 5. I have talked to him about offloading he is currently using a cam boot. He did not do well with a total contact cast in terms of drainage and in any case I am too concerned about colonization/bioburden to consider doing this in the short-term. Electronic Signature(s) Signed: 12/23/2020 4:56:16 PM By: [name] MD Entered By: [name] on 12/23/2020 08:19:09 -------------------------------------------------------------------------------- SuperBill Details Patient Name: Date of Service: [name], [name] 12/23/2020 Medical Record Number: [id number] Patient Account Number: [id number] Date of Birth/Sex: Treating RN: [birth year] (35 y.o. [name] Primary Care Provider: [name] Other Clinician: Referring Provider: Treating Provider/Extender: [name] in Treatment: 60 Diagnosis Coding ICD-10 Codes Code Description E11.621 Type 2 diabetes mellitus with foot ulcer L97.528 Non-pressure chronic ulcer of other part of left foot with other specified severity Facility Procedures CPT4 Code: [id number] Description: 99213 - WOUND CARE VISIT-LEV 3 EST PT Modifier: Quantity: 1 Physician Procedures : CPT4 Code Description Modifier [id number] - WC PHYS LEVEL 4 - EST PT ICD-10 Diagnosis Description E11.621 Type 2 diabetes mellitus with foot ulcer L97.528 Non-pressure chronic ulcer of other part of left foot with other specified severity Quantity: 1 Electronic Signature(s) Signed: 12/23/2020 4:56:16 PM By: [name] MD Entered By: [name] on 12/23/2020 08:19:23

## 2020-12-23 NOTE — Progress Notes (Signed)
Mccleave, [name] ([id number]) Visit Report for 12/23/2020 Arrival Information Details Patient Name: Date of Service: [name] D 12/23/2020 7:30 A M Medical Record Number: [id number] Patient Account Number: [id number] Date of Birth/Sex: Treating RN: November 03, 1985 (35 y.o. [name] Primary Care Caroleena Paolini: [name] Other Clinician: Referring Shauna Bodkins: Treating Amiyah Shryock/Extender: [name] in Treatment: 75 Visit Information History Since Last Visit Added or deleted any medications: No Patient Arrived: Ambulatory Any new allergies or adverse reactions: No Arrival Time: 07:53 Had a fall or experienced change in No Accompanied By: alone activities of daily living that may affect Transfer Assistance: None risk of falls: Patient Identification Verified: Yes Signs or symptoms of abuse/neglect since last visito No Secondary Verification Process Completed: Yes Hospitalized since last visit: No Patient Requires Transmission-Based Precautions: No Implantable device outside of the clinic excluding No Patient Has Alerts: No cellular tissue based products placed in the center since last visit: Has Dressing in Place as Prescribed: Yes Pain Present Now: No Electronic Signature(s) Signed: 12/23/2020 5:56:57 PM By: [name] RN, BSN Entered By: [name] on 12/23/2020 07:53:51 -------------------------------------------------------------------------------- Clinic Level of Care Assessment Details Patient Name: Date of Service: [name], [name] 12/23/2020 7:30 A M Medical Record Number: [id number] Patient Account Number: [id number] Date of Birth/Sex: Treating RN: 29-Oct-1985 (35 y.o. [name] Primary Care [name]: [name] Other Clinician: Referring Kamden Stanislaw: Treating Umi Mainor/Extender: [name] in Treatment: 65 Clinic Level of Care Assessment Items TOOL 4 Quantity Score X- 1 0 Use when only an  EandM is performed on FOLLOW-UP visit ASSESSMENTS - Nursing Assessment / Reassessment X- 1 10 Reassessment of Co-morbidities (includes updates in patient status) X- 1 5 Reassessment of Adherence to Treatment Plan ASSESSMENTS - Wound and Skin A ssessment / Reassessment X - Simple Wound Assessment / Reassessment - one wound 1 5 _0  - 0 Complex Wound Assessment / Reassessment - multiple wounds _1  - 0 Dermatologic / Skin Assessment (not related to wound area) ASSESSMENTS - Focused Assessment _2  - 0 Circumferential Edema Measurements - multi extremities _3  - 0 Nutritional Assessment / Counseling / Intervention X- 1 5 Lower Extremity Assessment (monofilament, tuning fork, pulses) _4  - 0 Peripheral Arterial Disease Assessment (using hand held doppler) ASSESSMENTS - Ostomy and/or Continence Assessment and Care _5  - 0 Incontinence Assessment and Management _6  - 0 Ostomy Care Assessment and Management (repouching, etc.) PROCESS - Coordination of Care X - Simple Patient / Family Education for ongoing care 1 15 _7  - 0 Complex (extensive) Patient / Family Education for ongoing care X- 1 10 Staff obtains Programmer, systems, Records, T Results / Process Orders est _8  - 0 Staff telephones HHA, Nursing Homes / Clarify orders / etc _9  - 0 Routine Transfer to another Facility (non-emergent condition) _10  - 0 Routine Hospital Admission (non-emergent condition) _11  - 0 New Admissions / Biomedical engineer / Ordering NPWT Apligraf, etc. , _12  - 0 Emergency Hospital Admission (emergent condition) X- 1 10 Simple Discharge Coordination _13  - 0 Complex (extensive) Discharge Coordination PROCESS - Special Needs _14  - 0 Pediatric / Minor Patient Management _15  - 0 Isolation Patient Management _16  - 0 Hearing / Language / Visual special needs _17  - 0 Assessment of Community assistance (transportation, D/C planning, etc.) _18  - 0 Additional assistance / Altered mentation _19  - 0 Support Surface(s)  Assessment (bed, cushion, seat, etc.) INTERVENTIONS - Wound Cleansing / Measurement X - Simple Wound Cleansing - one wound 1 5 _20  - 0 Complex Wound Cleansing -  multiple wounds X- 1 5 Wound Imaging (photographs - any number of wounds) _0  - 0 Wound Tracing (instead of photographs) X- 1 5 Simple Wound Measurement - one wound _1  - 0 Complex Wound Measurement - multiple wounds INTERVENTIONS - Wound Dressings _2  - 0 Small Wound Dressing one or multiple wounds X- 1 15 Medium Wound Dressing one or multiple wounds _3  - 0 Large Wound Dressing one or multiple wounds <YBOFBPZWCHENIDPO>_2<\/UMPNTIRWERXVQMGQ>_6  - 0 Application of Medications - topical <PYPPJKDTOIZTIWPY>_0<\/DXIPJASNKNLZJQBH>_4  - 0 Application of Medications - injection INTERVENTIONS - Miscellaneous _6  - 0 External ear exam _7  - 0 Specimen Collection (cultures, biopsies, blood, body fluids, etc.) _8  - 0 Specimen(s) / Culture(s) sent or taken to Lab for analysis _9  - 0 Patient Transfer (multiple staff / Civil Service fast streamer / Similar devices) _10  - 0 Simple Staple / Suture removal (25 or less) _11  - 0 Complex Staple / Suture removal (26 or more) _12  - 0 Hypo / Hyperglycemic Management (close monitor of Blood Glucose) _13  - 0 Ankle / Brachial Index (ABI) - do not check if billed separately X- 1 5 Vital Signs Has the patient been seen at the hospital within the last three years: Yes Total Score: 95 Level Of Care: New/Established - Level 3 Electronic Signature(s) Signed: 12/23/2020 5:56:57 PM By: [name] RN, BSN Entered By: [name] on 12/23/2020 08:17:21 -------------------------------------------------------------------------------- Encounter Discharge Information Details Patient Name: Date of Service: [name], [name] 12/23/2020 7:30 A M Medical Record Number: [id number] Patient Account Number: [id number] Date of Birth/Sex: Treating RN: 28-May-1985 (35 y.o. [name] Primary Care Chloe Baig: [name] Other Clinician: Referring Ade Stmarie: Treating Otilio Groleau/Extender: [name] in Treatment: 61 Encounter Discharge Information Items Discharge Condition: Stable Ambulatory Status: Ambulatory Discharge Destination: Home Transportation: Private Auto Accompanied By: alone Schedule Follow-up Appointment: Yes Clinical Summary of Care: Patient Declined Electronic Signature(s) Signed: 12/23/2020 5:56:57 PM By: [name] RN, BSN Entered By: [name] on 12/23/2020 08:23:20 -------------------------------------------------------------------------------- Lower Extremity Assessment Details Patient Name: Date of Service: [name], [name] 12/23/2020 7:30 A M Medical Record Number: [id number] Patient Account Number: [id number] Date of Birth/Sex: Treating RN: 1985/07/19 (36 y.o. [name] Primary Care Kenyah Luba: [name] Other Clinician: Referring Dameka Younker: Treating Valori Hollenkamp/Extender: [name] in Treatment: 60 Edema Assessment Assessed: [name]: No] [name]: No] Edema: [Left: Ye] [Right: s] Calf Left: Right: Point of Measurement: 48 cm From Medial Instep 47 cm Ankle Left: Right: Point of Measurement: 11 cm From Medial Instep 27 cm Vascular Assessment Pulses: Dorsalis Pedis Palpable: [Left:Yes] Electronic Signature(s) Signed: 12/23/2020 5:56:57 PM By: [name] RN, BSN Entered By: [name] on 12/23/2020 08:02:51 -------------------------------------------------------------------------------- Multi Wound Chart Details Patient Name: Date of Service: [name], [name] 12/23/2020 7:30 A M Medical Record Number: [id number] Patient Account Number: [id number] Date of Birth/Sex: Treating RN: [birth year] (35 y.o. [name] Primary Care Zera Markwardt: [name] Other Clinician: Referring Jef Futch: Treating Izaiha Lo/Extender: [name] in Treatment: 60 Vital Signs Height(in): 77 Capillary Blood Glucose(mg/dl): 111 Weight(lbs): 280 Pulse(bpm):  30 Body Mass Index(BMI): 33 Blood Pressure(mmHg): 143/88 Temperature(F): 98.9 Respiratory Rate(breaths/min): 18 Photos: [N/A:N/A] Left, Lateral Foot N/A N/A Wound Location: Trauma N/A N/A Wounding Event: Diabetic Wound/Ulcer of the Lower N/A N/A Primary Etiology: Extremity Type II Diabetes N/A N/A Comorbid History: 10/02/2019 N/A N/A Date Acquired: 12 N/A N/A Weeks of Treatment: Open N/A N/A Wound Status: 3.3x3.1x0.8 N/A N/A Measurements L x W x D (cm) 8.035 N/A N/A A (cm) :  rea 63.428 N/A N/A Volume (cm) : -387.30% N/A N/A % Reduction in A rea: -3795.80% N/A N/A % Reduction in Volume: 5 Starting Position 1 (o'clock): 7 Ending Position 1 (o'clock): 1 Maximum Distance 1 (cm): Yes N/A N/A Undermining: Grade 2 N/A N/A Classification: Large N/A N/A Exudate A mount: Serosanguineous N/A N/A Exudate Type: red, brown N/A N/A Exudate Color: Well defined, not attached N/A N/A Wound Margin: Large (67-100%) N/A N/A Granulation A mount: Red, Pink N/A N/A Granulation Quality: Small (1-33%) N/A N/A Necrotic A mount: Fat Layer (Subcutaneous Tissue): Yes N/A N/A Exposed Structures: Fascia: No Tendon: No Muscle: No Joint: No Bone: No Small (1-33%) N/A N/A Epithelialization: Treatment Notes Electronic Signature(s) Signed: 12/23/2020 4:56:16 PM By: [name] MD Signed: 12/23/2020 5:56:57 PM By: [name] RN, BSN Entered By: [name] on 12/23/2020 08:13:44 -------------------------------------------------------------------------------- Multi-Disciplinary Care Plan Details Patient Name: Date of Service: [name], [name] 12/23/2020 7:30 A M Medical Record Number: [id number] Patient Account Number: [id number] Date of Birth/Sex: Treating RN: 11/05/1985 (35 y.o. [name] Primary Care Derisha Funderburke: [name] Other Clinician: Referring Naida Escalante: Treating Calee Nugent/Extender: [name] in Treatment:  71 Multidisciplinary Care Plan reviewed with physician Active Inactive Nutrition Nursing Diagnoses: Imbalanced nutrition Potential for alteratiion in Nutrition/Potential for imbalanced nutrition Goals: Patient/caregiver agrees to and verbalizes understanding of need to use nutritional supplements and/or vitamins as prescribed Date Initiated: 10/24/2019 Date Inactivated: 04/06/2020 Target Resolution Date: 04/03/2020 Goal Status: Met Patient/caregiver will maintain therapeutic glucose control Date Initiated: 10/24/2019 Target Resolution Date: 01/22/2021 Goal Status: Active Interventions: Assess HgA1c results as ordered upon admission and as needed Assess patient nutrition upon admission and as needed per policy Provide education on elevated blood sugars and impact on wound healing Provide education on nutrition Treatment Activities: Education provided on Nutrition : 11/24/2020 Notes: 11/17/20: Glucose control ongoing issue, target date extended. Wound/Skin Impairment Nursing Diagnoses: Impaired tissue integrity Knowledge deficit related to ulceration/compromised skin integrity Goals: Patient/caregiver will verbalize understanding of skin care regimen Date Initiated: 10/24/2019 Target Resolution Date: 01/22/2021 Goal Status: Active Ulcer/skin breakdown will have a volume reduction of 30% by week 4 Date Initiated: 10/24/2019 Date Inactivated: 01/16/2020 Target Resolution Date: 01/10/2020 Unmet Reason: no change in Goal Status: Unmet measurements. Interventions: Assess patient/caregiver ability to obtain necessary supplies Assess patient/caregiver ability to perform ulcer/skin care regimen upon admission and as needed Assess ulceration(s) every visit Provide education on ulcer and skin care Notes: 11/17/20: Wound care regimen continues Electronic Signature(s) Signed: 12/23/2020 5:56:57 PM By: [name] RN, BSN Entered By: [name] on 12/23/2020  08:05:15 -------------------------------------------------------------------------------- Pain Assessment Details Patient Name: Date of Service: [name], [name] 12/23/2020 7:30 A M Medical Record Number: [id number] Patient Account Number: [id number] Date of Birth/Sex: Treating RN: 1986/01/04 (35 y.o. [name] Primary Care Laylani Pudwill: [name] Other Clinician: Referring Ramonte Mena: Treating Loxley Cibrian/Extender: [name] in Treatment: 60 Active Problems Location of Pain Severity and Description of Pain Patient Has Paino No Site Locations Pain Management and Medication Current Pain Management: Electronic Signature(s) Signed: 12/23/2020 5:56:57 PM By: [name] RN, BSN Entered By: [name] on 12/23/2020 07:54:22 -------------------------------------------------------------------------------- Patient/Caregiver Education Details Patient Name: Date of Service: [name], [name] 9/21/2022andnbsp7:30 A M Medical Record Number: [id number] Patient Account Number: [id number] Date of Birth/Gender: Treating RN: 07-Mar-1986 (35 y.o. [name] Primary Care Physician: [name] Other Clinician: Referring Physician: Treating Physician/Extender: [name] in Treatment: 6 Education Assessment Education Provided To: Patient  Education Topics Provided Wound/Skin Impairment: Methods: Explain/Verbal Responses: State content correctly Electronic Signature(s) Signed: 12/23/2020 5:56:57 PM By: [name] RN, BSN Entered By: [name] on 12/23/2020 08:05:35 -------------------------------------------------------------------------------- Wound Assessment Details Patient Name: Date of Service: [name], [name] 12/23/2020 7:30 A M Medical Record Number: [id number] Patient Account Number: [id number] Date of Birth/Sex: Treating RN: [birth year] (35 y.o. [name] Primary Care Tensley Wery: [name] Other Clinician: Referring Jolyssa Oplinger: Treating Laylamarie Meuser/Extender: [name] in Treatment: 60 Wound Status Wound Number: 3 Primary Etiology: Diabetic Wound/Ulcer of the Lower Extremity Wound Location: Left, Lateral Foot Wound Status: Open Wounding Event: Trauma Comorbid History: Type II Diabetes Date Acquired: 10/02/2019 Weeks Of Treatment: 60 Clustered Wound: No Photos Wound Measurements Length: (cm) 3.3 Width: (cm) 3.1 Depth: (cm) 0.8 Area: (cm) 8.035 Volume: (cm) 6.428 % Reduction in Area: -387.3% % Reduction in Volume: -3795.8% Epithelialization: Small (1-33%) Tunneling: No Undermining: Yes Starting Position (o'clock): 5 Ending Position (o'clock): 7 Maximum Distance: (cm) 1 Wound Description Classification: Grade 2 Wound Margin: Well defined, not attached Exudate Amount: Large Exudate Type: Serosanguineous Exudate Color: red, brown Foul Odor After Cleansing: No Slough/Fibrino Yes Wound Bed Granulation Amount: Large (67-100%) Exposed Structure Granulation Quality: Red, Pink Fascia Exposed: No Necrotic Amount: Small (1-33%) Fat Layer (Subcutaneous Tissue) Exposed: Yes Necrotic Quality: Adherent Slough Tendon Exposed: No Muscle Exposed: No Joint Exposed: No Bone Exposed: No Treatment Notes Wound #3 (Foot) Wound Laterality: Left, Lateral Cleanser Soap and Water Discharge Instruction: May shower and wash wound with dial antibacterial soap and water prior to dressing change. Wound Cleanser Discharge Instruction: Cleanse the wound with wound cleanser prior to applying a clean dressing using gauze sponges, not tissue or cotton balls. Peri-Wound Care Zinc Oxide Ointment 30g tube Discharge Instruction: Apply Zinc Oxide to periwound with each dressing change as needed. Topical Primary Dressing KerraCel Ag Gelling Fiber Dressing, 4x5 in (silver alginate) Discharge Instruction: Apply silver alginate to wound bed as  instructed Secondary Dressing Woven Gauze Sponge, Non-Sterile 4x4 in Discharge Instruction: Apply over primary dressing as directed. Zetuvit Plus 4x8 in Discharge Instruction: Apply over primary dressing as directed. Optifoam Non-Adhesive Dressing, 4x4 in Discharge Instruction: Apply over primary dressing as directed. Secured With [employer] 4x5 (in/yd) Discharge Instruction: Secure with Coban as directed. Kerlix Roll Sterile, 4.5x3.1 (in/yd) Discharge Instruction: Secure with Kerlix as directed. 48M Medipore H Soft Cloth Surgical T 4 x 2 (in/yd) ape Discharge Instruction: Secure dressing with tape as directed. Compression Wrap Compression Stockings Add-Ons Electronic Signature(s) Signed: 12/23/2020 5:56:57 PM By: [name] RN, BSN Entered By: [name] on 12/23/2020 08:02:12 -------------------------------------------------------------------------------- Vitals Details Patient Name: Date of Service: [name], [name] 12/23/2020 7:30 A M Medical Record Number: [id number] Patient Account Number: [id number] Date of Birth/Sex: Treating RN: 1985/11/25 (35 y.o. [name] Primary Care Alila Sotero: [name] Other Clinician: Referring Pradeep Beaubrun: Treating Kaidan Harpster/Extender: [name] in Treatment: 60 Vital Signs Time Taken: 07:53 Temperature (F): 98.9 Height (in): 77 Pulse (bpm): 86 Weight (lbs): 280 Respiratory Rate (breaths/min): 18 Body Mass Index (BMI): 33.2 Blood Pressure (mmHg): 143/88 Capillary Blood Glucose (mg/dl): 111 Reference Range: 80 - 120 mg / dl Notes glucose per pt report Electronic Signature(s) Signed: 12/23/2020 5:56:57 PM By: [name] RN, BSN Entered By: [name] on 12/23/2020 07:54:17

## 2020-12-31 ENCOUNTER — Other Ambulatory Visit: Payer: Self-pay

## 2020-12-31 ENCOUNTER — Encounter (HOSPITAL_BASED_OUTPATIENT_CLINIC_OR_DEPARTMENT_OTHER): Payer: BC Managed Care – PPO | Admitting: Internal Medicine

## 2020-12-31 DIAGNOSIS — E11621 Type 2 diabetes mellitus with foot ulcer: Secondary | ICD-10-CM | POA: Diagnosis not present

## 2021-01-01 NOTE — Progress Notes (Signed)
Ley, [name] ([id number]) Visit Report for 12/31/2020 Arrival Information Details Patient Name: Date of Service: [name] 12/31/2020 7:30 A M Medical Record Number: [id number] Patient Account Number: [id number] Date of Birth/Sex: Treating RN: 14-Feb-1986 (35 y.o. [name] Primary Care Katrinka Herbison: [name] Other Clinician: Referring Bristol Osentoski: Treating Hodan Wurtz/Extender: [name] in Treatment: 44 Visit Information History Since Last Visit Added or deleted any medications: No Patient Arrived: Ambulatory Any new allergies or adverse reactions: No Arrival Time: 07:47 Had a fall or experienced change in No Accompanied By: self activities of daily living that may affect Transfer Assistance: None risk of falls: Patient Identification Verified: Yes Signs or symptoms of abuse/neglect since last No Secondary Verification Process Completed: Yes visito Patient Requires Transmission-Based Precautions: No Hospitalized since last visit: No Patient Has Alerts: No Implantable device outside of the clinic No excluding cellular tissue based products placed in the center since last visit: Has Dressing in Place as Prescribed: Yes Has Compression in Place as Prescribed: Yes Has Footwear/Offloading in Place as Yes Prescribed: Left: Removable Cast Walker/Walking Boot Pain Present Now: No Electronic Signature(s) Signed: 12/31/2020 6:01:32 PM By: Baruch Gouty RN, BSN Entered By: Baruch Gouty on 12/31/2020 07:48:49 -------------------------------------------------------------------------------- Clinic Level of Care Assessment Details Patient Name: Date of Service: [name], [name] 12/31/2020 7:30 A M Medical Record Number: [id number] Patient Account Number: [id number] Date of Birth/Sex: Treating RN: [birth year] (35 y.o. [name] Primary Care Noam Karaffa: [name] Other Clinician: Referring Endy Easterly: Treating Saharra Santo/Extender:  [name] in Treatment: 62 Clinic Level of Care Assessment Items TOOL 4 Quantity Score []  - 0 Use when only an EandM is performed on FOLLOW-UP visit ASSESSMENTS - Nursing Assessment / Reassessment X- 1 10 Reassessment of Co-morbidities (includes updates in patient status) X- 1 5 Reassessment of Adherence to Treatment Plan ASSESSMENTS - Wound and Skin A ssessment / Reassessment X - Simple Wound Assessment / Reassessment - one wound 1 5 []  - 0 Complex Wound Assessment / Reassessment - multiple wounds []  - 0 Dermatologic / Skin Assessment (not related to wound area) ASSESSMENTS - Focused Assessment X- 1 5 Circumferential Edema Measurements - multi extremities []  - 0 Nutritional Assessment / Counseling / Intervention X- 1 5 Lower Extremity Assessment (monofilament, tuning fork, pulses) []  - 0 Peripheral Arterial Disease Assessment (using hand held doppler) ASSESSMENTS - Ostomy and/or Continence Assessment and Care []  - 0 Incontinence Assessment and Management []  - 0 Ostomy Care Assessment and Management (repouching, etc.) PROCESS - Coordination of Care X - Simple Patient / Family Education for ongoing care 1 15 []  - 0 Complex (extensive) Patient / Family Education for ongoing care X- 1 10 Staff obtains Programmer, systems, Records, T Results / Process Orders est []  - 0 Staff telephones HHA, Nursing Homes / Clarify orders / etc []  - 0 Routine Transfer to another Facility (non-emergent condition) []  - 0 Routine Hospital Admission (non-emergent condition) []  - 0 New Admissions / Biomedical engineer / Ordering NPWT Apligraf, etc. , []  - 0 Emergency Hospital Admission (emergent condition) X- 1 10 Simple Discharge Coordination []  - 0 Complex (extensive) Discharge Coordination PROCESS - Special Needs []  - 0 Pediatric / Minor Patient Management []  - 0 Isolation Patient Management []  - 0 Hearing / Language / Visual special needs []  - 0 Assessment  of Community assistance (transportation, [name]/C planning, etc.) []  - 0 Additional assistance / Altered mentation []  - 0 Support Surface(s) Assessment (bed, cushion, seat, etc.) INTERVENTIONS - Wound  Cleansing / Measurement X - Simple Wound Cleansing - one wound 1 5 []  - 0 Complex Wound Cleansing - multiple wounds X- 1 5 Wound Imaging (photographs - any number of wounds) []  - 0 Wound Tracing (instead of photographs) X- 1 5 Simple Wound Measurement - one wound []  - 0 Complex Wound Measurement - multiple wounds INTERVENTIONS - Wound Dressings X - Small Wound Dressing one or multiple wounds 1 10 []  - 0 Medium Wound Dressing one or multiple wounds []  - 0 Large Wound Dressing one or multiple wounds X- 1 5 Application of Medications - topical []  - 0 Application of Medications - injection INTERVENTIONS - Miscellaneous []  - 0 External ear exam []  - 0 Specimen Collection (cultures, biopsies, blood, body fluids, etc.) []  - 0 Specimen(s) / Culture(s) sent or taken to Lab for analysis []  - 0 Patient Transfer (multiple staff / Civil Service fast streamer / Similar devices) []  - 0 Simple Staple / Suture removal (25 or less) []  - 0 Complex Staple / Suture removal (26 or more) []  - 0 Hypo / Hyperglycemic Management (close monitor of Blood Glucose) []  - 0 Ankle / Brachial Index (ABI) - do not check if billed separately X- 1 5 Vital Signs Has the patient been seen at the hospital within the last three years: Yes Total Score: 100 Level Of Care: New/Established - Level 3 Electronic Signature(s) Signed: 12/31/2020 6:01:32 PM By: Baruch Gouty RN, BSN Entered By: Baruch Gouty on 12/31/2020 08:30:00 -------------------------------------------------------------------------------- Lower Extremity Assessment Details Patient Name: Date of Service: [name], [name] 12/31/2020 7:30 A M Medical Record Number: [id number] Patient Account Number: [id number] Date of Birth/Sex: Treating RN: [birth year] (35 y.o.  [name] Primary Care Mearl Olver: [name] Other Clinician: Referring Kaylon Laroche: Treating Levar Fayson/Extender: [name] in Treatment: 62 Edema Assessment Assessed: [name]: No] [name]: No] Edema: [Left: Ye] [Right: s] Calf Left: Right: Point of Measurement: 48 cm From Medial Instep 47.6 cm Ankle Left: Right: Point of Measurement: 11 cm From Medial Instep 29.5 cm Knee To Floor Left: Right: From Medial Instep 53 cm Vascular Assessment Pulses: Dorsalis Pedis Palpable: [Left:Yes] Electronic Signature(s) Signed: 12/31/2020 6:01:32 PM By: Baruch Gouty RN, BSN Entered By: Baruch Gouty on 12/31/2020 07:51:24 -------------------------------------------------------------------------------- Multi Wound Chart Details Patient Name: Date of Service: [name], [name] 12/31/2020 7:30 A M Medical Record Number: [id number] Patient Account Number: [id number] Date of Birth/Sex: Treating RN: [birth year] (35 y.o. [name] Primary Care Roma Bierlein: [name] Other Clinician: Referring Princesa Willig: Treating Safi Culotta/Extender: [name] in Treatment: 62 Vital Signs Height(in): 77 Capillary Blood Glucose(mg/dl): 130 Weight(lbs): 280 Pulse(bpm): 29 Body Mass Index(BMI): 33 Blood Pressure(mmHg): 141/93 Temperature(F): 98.7 Respiratory Rate(breaths/min): 18 Photos: [N/A:N/A] Left, Lateral Foot N/A N/A Wound Location: Trauma N/A N/A Wounding Event: Diabetic Wound/Ulcer of the Lower N/A N/A Primary Etiology: Extremity Type II Diabetes N/A N/A Comorbid History: 10/02/2019 N/A N/A Date Acquired: 9 N/A N/A Weeks of Treatment: Open N/A N/A Wound Status: 3.4x3.2x0.7 N/A N/A Measurements L x W x [name] 8.545 N/A N/A A (cm) : rea 5.982 N/A N/A Volume (cm) : -418.20% N/A N/A % Reduction in A rea: -3525.50% N/A N/A % Reduction in Volume: 12 Starting Position 1 (o'clock): 9 Ending Position 1  (o'clock): 1.8 Maximum Distance 1 (cm): Yes N/A N/A Undermining: Grade 2 N/A N/A Classification: Medium N/A N/A Exudate A mount: Purulent N/A N/A Exudate Type: yellow, brown, green N/A N/A Exudate Color: Thickened N/A N/A Wound Margin: Large (67-100%) N/A  N/A Granulation A mount: Red, Pink, Friable N/A N/A Granulation Quality: Small (1-33%) N/A N/A Necrotic A mount: Fat Layer (Subcutaneous Tissue): Yes N/A N/A Exposed Structures: Fascia: No Tendon: No Muscle: No Joint: No Bone: No None N/A N/A Epithelialization: Treatment Notes Electronic Signature(s) Signed: 12/31/2020 6:01:32 PM By: Baruch Gouty RN, BSN Signed: 01/01/2021 3:46:44 PM By: [name] MD Entered By: [name] on 12/31/2020 08:22:17 -------------------------------------------------------------------------------- Multi-Disciplinary Care Plan Details Patient Name: Date of Service: [name], [name] 12/31/2020 7:30 A M Medical Record Number: [id number] Patient Account Number: [id number] Date of Birth/Sex: Treating RN: 1986/03/19 (35 y.o. [name] Primary Care Roch Quach: [name] Other Clinician: Referring Mohammedali Bedoy: Treating Loic Hobin/Extender: [name] in Treatment: 70 Multidisciplinary Care Plan reviewed with physician Active Inactive Nutrition Nursing Diagnoses: Imbalanced nutrition Potential for alteratiion in Nutrition/Potential for imbalanced nutrition Goals: Patient/caregiver agrees to and verbalizes understanding of need to use nutritional supplements and/or vitamins as prescribed Date Initiated: 10/24/2019 Date Inactivated: 04/06/2020 Target Resolution Date: 04/03/2020 Goal Status: Met Patient/caregiver will maintain therapeutic glucose control Date Initiated: 10/24/2019 Target Resolution Date: 01/22/2021 Goal Status: Active Interventions: Assess HgA1c results as ordered upon admission and as needed Assess patient nutrition upon  admission and as needed per policy Provide education on elevated blood sugars and impact on wound healing Provide education on nutrition Treatment Activities: Education provided on Nutrition : 12/02/2020 Notes: 11/17/20: Glucose control ongoing issue, target date extended. Wound/Skin Impairment Nursing Diagnoses: Impaired tissue integrity Knowledge deficit related to ulceration/compromised skin integrity Goals: Patient/caregiver will verbalize understanding of skin care regimen Date Initiated: 10/24/2019 Target Resolution Date: 01/22/2021 Goal Status: Active Ulcer/skin breakdown will have a volume reduction of 30% by week 4 Date Initiated: 10/24/2019 Date Inactivated: 01/16/2020 Target Resolution Date: 01/10/2020 Unmet Reason: no change in Goal Status: Unmet measurements. Interventions: Assess patient/caregiver ability to obtain necessary supplies Assess patient/caregiver ability to perform ulcer/skin care regimen upon admission and as needed Assess ulceration(s) every visit Provide education on ulcer and skin care Notes: 11/17/20: Wound care regimen continues Electronic Signature(s) Signed: 12/31/2020 6:01:32 PM By: Baruch Gouty RN, BSN Entered By: Baruch Gouty on 12/31/2020 07:57:09 -------------------------------------------------------------------------------- Pain Assessment Details Patient Name: Date of Service: [name], [name] 12/31/2020 7:30 A M Medical Record Number: [id number] Patient Account Number: [id number] Date of Birth/Sex: Treating RN: [birth year] (35 y.o. [name] Primary Care Teonna Coonan: [name] Other Clinician: Referring Stephanye Finnicum: Treating Olia Hinderliter/Extender: [name] in Treatment: 62 Active Problems Location of Pain Severity and Description of Pain Patient Has Paino No Site Locations Rate the pain. Current Pain Level: 0 Pain Management and Medication Current Pain Management: Electronic  Signature(s) Signed: 12/31/2020 6:01:32 PM By: Baruch Gouty RN, BSN Entered By: Baruch Gouty on 12/31/2020 07:49:52 -------------------------------------------------------------------------------- Patient/Caregiver Education Details Patient Name: Date of Service: [name], [name] 9/29/2022andnbsp7:30 A M Medical Record Number: [id number] Patient Account Number: [id number] Date of Birth/Gender: Treating RN: 02/20/1986 (35 y.o. [name] Primary Care Physician: [name] Other Clinician: Referring Physician: Treating Physician/Extender: [name] in Treatment: 29 Education Assessment Education Provided To: Patient Education Topics Provided Elevated Blood Sugar/ Impact on Healing: Methods: Explain/Verbal Responses: Reinforcements needed, State content correctly Offloading: Methods: Explain/Verbal Responses: Reinforcements needed, State content correctly Wound/Skin Impairment: Methods: Explain/Verbal Responses: Reinforcements needed, State content correctly Electronic Signature(s) Signed: 12/31/2020 6:01:32 PM By: Baruch Gouty RN, BSN Entered By: Baruch Gouty on 12/31/2020 07:57:46 -------------------------------------------------------------------------------- Wound Assessment Details Patient Name: Date of Service: [name], Max  [name] 12/31/2020 7:30 A M Medical Record Number: [id number] Patient Account Number: [id number] Date of Birth/Sex: Treating RN: 1985-06-28 (35 y.o. [name] Primary Care Miliani Deike: [name] Other Clinician: Referring Maneh Sieben: Treating Charmion Hapke/Extender: [name] in Treatment: 62 Wound Status Wound Number: 3 Primary Etiology: Diabetic Wound/Ulcer of the Lower Extremity Wound Location: Left, Lateral Foot Wound Status: Open Wounding Event: Trauma Comorbid History: Type II Diabetes Date Acquired: 10/02/2019 Weeks Of Treatment: 62 Clustered Wound:  No Photos Wound Measurements Length: (cm) 3.4 Width: (cm) 3.2 Depth: (cm) 0.7 Area: (cm) 8.545 Volume: (cm) 5.982 % Reduction in Area: -418.2% % Reduction in Volume: -3525.5% Epithelialization: None Tunneling: No Undermining: Yes Starting Position (o'clock): 12 Ending Position (o'clock): 9 Maximum Distance: (cm) 1.8 Wound Description Classification: Grade 2 Wound Margin: Thickened Exudate Amount: Medium Exudate Type: Purulent Exudate Color: yellow, brown, green Foul Odor After Cleansing: No Slough/Fibrino No Wound Bed Granulation Amount: Large (67-100%) Exposed Structure Granulation Quality: Red, Pink, Friable Fascia Exposed: No Necrotic Amount: Small (1-33%) Fat Layer (Subcutaneous Tissue) Exposed: Yes Necrotic Quality: Adherent Slough Tendon Exposed: No Muscle Exposed: No Joint Exposed: No Bone Exposed: No Electronic Signature(s) Signed: 12/31/2020 6:01:32 PM By: Baruch Gouty RN, BSN Entered By: Baruch Gouty on 12/31/2020 08:00:02 -------------------------------------------------------------------------------- Vitals Details Patient Name: Date of Service: [name], [name] 12/31/2020 7:30 A M Medical Record Number: [id number] Patient Account Number: [id number] Date of Birth/Sex: Treating RN: 06/29/1985 (35 y.o. [name] Primary Care Khylin Gutridge: [name] Other Clinician: Referring Arie Powell: Treating Lexander Tremblay/Extender: [name] in Treatment: 62 Vital Signs Time Taken: 07:48 Temperature (F): 98.7 Height (in): 77 Pulse (bpm): 92 Source: Stated Respiratory Rate (breaths/min): 18 Weight (lbs): 280 Blood Pressure (mmHg): 141/93 Source: Stated Capillary Blood Glucose (mg/dl): 130 Body Mass Index (BMI): 33.2 Reference Range: 80 - 120 mg / dl Notes glucose per pt report this am Electronic Signature(s) Signed: 12/31/2020 6:01:32 PM By: Baruch Gouty RN, BSN Entered By: Baruch Gouty on 12/31/2020  07:49:40

## 2021-01-01 NOTE — Progress Notes (Signed)
[name], Max ([id number]) Visit Report for 12/31/2020 HPI Details Patient Name: Date of Service: [name] 12/31/2020 7:30 A M Medical Record Number: [id number] Patient Account Number: [id number] Date of Birth/Sex: Treating RN: 08-19-1985 (35 y.o. 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 He comes in for review of a wound over the plantar fifth metatarsal head extending into the lateral part of the foot. He was followed for this previously by his podiatrist Dr. Cornelius Moras. As the patient tells his story he went to see podiatry first for a swelling he developed on the lateral part of his fifth metatarsal head in May. He states this was "open" by podiatry and the area closed. He was followed up in June and it was again opened callus removed and it closed promptly. There were plans being made for surgery on the fifth metatarsal head in June however his blood sugar was apparently too high for anesthesia. Apparently the area was debrided and opened again in June and it is never closed since. Looking over the records from podiatry I am really not able to follow this. It was clear when he was first seen it was before 5/14 at that point he already had a wound. By 5/17 the ulcer was resolved. I do not see anything about a procedure. On 5/28 noted to have pre-ulcerative moderate keratosis. X-ray noted 1/5 contracted toe and tailor's bunion and metatarsal deformity. On a visit date on 09/28/2018 the dorsal part of the left foot it healed and resolved. There was concern about swelling in his lower extremity he was sent to the ER.. As far as I can tell he was seen in the ER on 7/12 with an ulcer on his left foot. A DVT rule  out of the left leg was negative. I do not think I have complete records from podiatry but I am not able to verify the procedures this patient states he had. He states after the last procedure the wound has never closed although I am not able to follow this in the records I have from podiatry. He has not had a recent x-ray The patient has been using Neosporin on the wound. He is wearing a Darco shoe. He is still very active up on his foot working and exercising. Past medical history; type 2 diabetes ketosis-prone, leg swelling with a negative DVT study in July. Non-smoker ABI in our clinic was 0.85 on the left 10/16; substantial wound on the plantar left fifth met head extending laterally almost to the dorsal fifth MTP. We have been using silver alginate we gave him a Darco forefoot off loader. An x-ray did not show evidence of osteomyelitis did note soft tissue emphysema which I think was due to gas tracking through an open wound. There is no doubt in my mind he requires an MRI 10/23; MRI not booked until 3 November at the earliest this is largely due to his glucose sensor in the right arm. We have been using silver alginate. There has been an improvement 10/29; I am still not exactly sure when his MRI is booked for. He says it is the third but it is the 10th in epic. This definitely needs to be done. He is running a low-grade fever today but no other symptoms. No real  improvement in the 1 02/26/2019 patient presents today for a follow-up visit here in our clinic he is last been seen in the clinic on October 29. Subsequently we were working on getting MRI to evaluate and see what exactly was going on and where we would need to go from the standpoint of whether or not he had osteomyelitis and again what treatments were going be required. Subsequently the patient ended up being admitted to the hospital on 02/07/2019 and was discharged on 02/14/2019. This is a somewhat interesting admission with a  discharge diagnosis of pneumonia due to COVID-19 although he was positive for COVID-19 when tested at the urgent care but negative x2 when he was actually in the hospital. With that being said he did have acute respiratory failure with hypoxia and it was noted he also have a left foot ulceration with osteomyelitis. With that being said he did require oxygen for his pneumonia and I level 4 L. He was placed on antivirals and steroids for the COVID-19. He was also transferred to the [facility] at one point. Nonetheless he did subsequently discharged home and since being home has done much better in that regard. The CT angiogram did not show any pulmonary embolism. With regard to the osteomyelitis the patient was placed on vancomycin and Zosyn while in the hospital but has been changed to Augmentin at discharge. It was also recommended that he follow- up with wound care and podiatry. Podiatry however wanted him to see [name] according to the patient prior to them doing anything further. His hemoglobin A1c was 9.9 as noted in the hospital. Have an MRI of the left foot performed while in the hospital on 02/04/2019. 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 There is an overlying plantar open wound noted an abscess tracking back along the lateral aspect of the fifth metatarsal shaft. There is otherwise diffuse cellulitis and mild fasciitis without findings of polymyositis. The patient did have recently pneumonia secondary to COVID-19 I looked in the chart through epic and it does appear that the patient may need to have an additional x-ray just to ensure everything is cleared and that he has no airspace disease prior to putting him into the [name]. 03/05/2019; patient was readmitted to the clinic last week. He was hospitalized twice for a viral upper respiratory tract infection from 11/1 through 11/4 and  then 11/5 through 11/12 ultimately this turned out to be Covid pneumonitis. Although he was discharged on oxygen he is not using it. He says he feels fine. He has no exercise limitation no cough no sputum. His O2 sat in our clinic today was 100% on room air. 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 He received Vanco and Zosyn in the hospital and then was discharged on 2 weeks of Augmentin. I do not see any relevant cultures. He was supposed to follow-up with infectious disease but I do not see that he has an appointment. 12/8; patient saw Dr. Novella Olive of infectious disease last week. He felt that he had had adequate antibiotic therapy. He did not go to follow-up with Dr. Amalia Hailey of podiatry and I have again talked to him about the pros and cons of this. He does not want to consider a ray amputation of this time. He is aware of the risks  of recurrence, migration etc. He started HBO today and tolerated this well. He can complete the Augmentin that I gave him last week. I have looked over the lab work that Dr. Chana Bode ordered his C-reactive protein was 3.3 and his sedimentation rate was 17. The C-reactive protein is never really been measurably that high in this patient 12/15; not much change in the wound today however he has undermining along the lateral part of the foot again more extensively than last week. He has some rims of epithelialization. We have been using silver alginate. He is undergoing hyperbarics but did not dive today 12/18; in for his obligatory first total contact cast change. Unfortunately there was pus coming from the undermining area around his fifth metatarsal head. This was cultured but will preclude reapplication of a cast. He is seen in conjunction with HBO 12/24; patient had staph lugdunensis in the wound in the undermining area laterally last time. We put him on  doxycycline which should have covered this. The wound looks better today. I am going to give him another week of doxycycline before reattempting the total contact cast 12/31; the patient is completing antibiotics. Hemorrhagic debris in the distal part of the wound with some undermining distally. He also had hyper granulation. Extensive debridement with a #5 curette. The infected area that was on the lateral part of the fifth met head is closed over. I do not think he needs any more antibiotics. Patient was seen prior to HBO. Preparations for a total contact cast were made in the cast will be placed post hyperbarics 04/11/19; once again the patient arrives today without complaint. He had been in a cast all week noted that he had heavy drainage this week. This resulted in large raised areas of macerated tissue around the wound 1/14; wound bed looks better slightly smaller. Hydrofera Blue has been changing himself. He had a heavy drainage last week which caused a lot of maceration around the wound so I took him out of a total contact cast he says the drainage is actually better this week He is seen today in conjunction with HBO 1/21; returns to clinic. He was up in [place] for a day or 2 attending a funeral. He comes back in with the wound larger and with a large area of exposed bone. He had osteomyelitis and septic arthritis of the fifth left metatarsal head while he was in hospital. He received IV antibiotics in the hospital for a prolonged period of time then 3 weeks of Augmentin. Subsequently I gave him 2 weeks of doxycycline for more superficial wound infection. When I saw this last week the wound was smaller the surface of the wound looks satisfactory. 1/28; patient missed hyperbarics today. Bone biopsy I did last time showed Enterococcus faecalis and Staphylococcus lugdunensis . He has a wide area of exposed bone. We are going to use silver alginate as of today. I had another ethical discussion  with the patient. 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 In this situation I think the likelihood of healing this is low. Therefore I have recommended a ray amputation and with the patient's agreement I have referred him to Dr. Doran Durand. The other issue is that his compliance with hyperbarics has been minimal because of his work schedule and given his underlying decision I am going to stop this today READMISSION 10/24/2019 MRI 09/29/2019 left foot IMPRESSION: 1. Apparent skin ulceration inferior and lateral to the 5th metatarsal base with underlying heterogeneous T2  signal and enhancement in the subcutaneous fat. Small peripherally enhancing fluid collections along the plantar and lateral aspects of the 5th metatarsal base suspicious for abscesses. 2. Interval amputation through the mid 5th metatarsal with nonspecific low-level marrow edema and enhancement. Given the proximity to the adjacent soft tissue inflammatory changes, osteomyelitis cannot be excluded. 3. The additional bones appear unremarkable. MRI 09/29/2019 right foot IMPRESSION: 1. Soft tissue ulceration lateral to the 5th MTP joint. There is low-level T2 hyperintensity within the 4th and 5th metatarsal heads and adjacent proximal phalanges without abnormal T1 signal or cortical destruction. These findings are nonspecific and could be seen with early marrow edema, hyperemia or early osteomyelitis. No evidence of septic joint. 2. Mild tenosynovitis and synovial enhancement associated with the extensor digitorum tendons at the level of the midfoot. 3. Diffuse low-level muscular T2 hyperintensity and enhancement, most consistent with diabetic myopathy. LEFT FOOT BONE Methicillin resistant staphylococcus aureus Staphylococcus lugdunensis MIC MIC CIPROFLOXACIN >=8 RESISTANT Resistant <=0.5 SENSI... Sensitive CLINDAMYCIN <=0.25 SENS... Sensitive >=8 RESISTANT Resistant ERYTHROMYCIN >=8  RESISTANT Resistant >=8 RESISTANT Resistant GENTAMICIN <=0.5 SENSI... Sensitive <=0.5 SENSI... Sensitive Inducible Clindamycin NEGATIVE Sensitive NEGATIVE Sensitive OXACILLIN >=4 RESISTANT Resistant 2 SENSITIVE Sensitive RIFAMPIN <=0.5 SENSI... Sensitive <=0.5 SENSI... Sensitive TETRACYCLINE <=1 SENSITIVE Sensitive <=1 SENSITIVE Sensitive TRIMETH/SULFA <=10 SENSIT Sensitive <=10 SENSIT Sensitive ... Marland Kitchen.. VANCOMYCIN 1 SENSITIVE Sensitive <=0.5 SENSI... Sensitive Right foot bone . Component 3 wk ago Specimen Description BONE Special Requests RIGHT 4 METATARSAL SAMPLE B Gram Stain NO WBC SEEN NO ORGANISMS SEEN Culture RARE METHICILLIN RESISTANT STAPHYLOCOCCUS AUREUS NO ANAEROBES ISOLATED Performed at [facility], [place] [address], [place], [place] [address] Report Status 10/08/2019 FINAL Organism ID, Bacteria METHICILLIN RESISTANT STAPHYLOCOCCUS AUREUS Resulting Agency [facility] Susceptibility Methicillin resistant staphylococcus aureus MIC CIPROFLOXACIN >=8 RESISTANT Resistant CLINDAMYCIN <=0.25 SENS... Sensitive ERYTHROMYCIN >=8 RESISTANT Resistant GENTAMICIN <=0.5 SENSI... Sensitive Inducible Clindamycin NEGATIVE Sensitive OXACILLIN >=4 RESISTANT Resistant RIFAMPIN <=0.5 SENSI... Sensitive TETRACYCLINE <=1 SENSITIVE Sensitive TRIMETH/SULFA <=10 SENSIT Sensitive ... VANCOMYCIN 1 SENSITIVE Sensitive This is a patient we had in clinic earlier this year with a wound over his left fifth metatarsal head. 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 Doran Durand on 05/16/2019. He seems to have developed problems with wounds on his bilateral feet in June 2021 although he may have had problems earlier than  this. He was in an urgent care with a right foot ulcer on 09/26/2019 and given a course of doxycycline. This was apparently after having trouble getting into see orthopedics. He was seen by podiatry on 09/28/2019 noted to have bilateral lower extremity ulcers including the left lateral fifth metatarsal base and the right subfifth met head. It was noted that had purulent drainage at that time. He required hospitalization from 6/20 through 7/2. This was because of worsening right foot wounds. He underwent bilateral operative incision and drainage and bone biopsies bilaterally. Culture results are listed above. He has been referred back to clinic by Dr. Jacqualyn Posey of podiatry. He is also followed by Dr. Megan Salon who saw him yesterday. He was discharged from hospital on Zyvox Flagyl and Levaquin and yesterday changed to doxycycline Flagyl and Levaquin. His inflammatory markers on 6/26 showed a sedimentation rate of 129 and a C-reactive protein of 5. This  is improved to 14 and 1.3 respectively. This would indicate improvement. ABIs in our clinic today were 1.23 on the right and 1.20 on the left 11/01/2019 on evaluation today patient appears to be doing fairly well in regard to the wounds on his feet at this point. Fortunately there is no signs of active infection at this time. No fevers, chills, nausea, vomiting, or diarrhea. He currently is seeing infectious disease and still under their care at this point. Subsequently he also has both wounds which she has not been using collagen on as he did not receive that in his packaging he did not call us and let us know that. Apparently that just was missed on the order. Nonetheless we will get that straightened out today. 8/9-Patient returns for bilateral foot wounds, using Prisma with hydrogel moistened dressings, and the wounds appear stable. Patient using surgical shoes, avoiding much pressure or weightbearing as much as possible 8/16; patient has bilateral foot  wounds. 1 on the right lateral foot proximally the other is on the left mid lateral foot. Both required debridement of callus and thick skin around the wounds. We have been using silver collagen 8/27; patient has bilateral lateral foot wounds. The area on the left substantially surrounded by callus and dry skin. This was removed from the wound edge. The underlying wound is small. The area on the right measured somewhat smaller today. 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 Unfortunately I did not update his antibiotics during today's visit. 9/10 I reviewed Dr. Hale Bogus last notes he felt he had completed antibiotics his inflammatory markers were reasonably well controlled. He has a small wound on the lateral left foot and a tiny area on the right which is just above closed. He is using Hydrofera Blue with border foam he has bilateral surgical shoes 9/24; 2 week f/u. doing well. right foot is closed. left foot still undermined. 10/14; right foot remains closed at the fifth met head. The area over the base of the left fifth metatarsal has a small open area but considerable undermining towards the plantar foot. Thick callus skin around this suggests an adequate pressure relief. We have talked about this. He says he is going to go back into his cam boot. I suggested a total contact cast he did not seem enamored with this suggestion 10/26; left foot base of the fifth metatarsal. Same condition as last time. He has skin over the area with an open wound however the skin is not adherent. He went to see Dr. Earleen Newport who did an x-ray and culture of his foot I have not reviewed the x-ray but the patient was not told anything. He is on doxycycline 11/11; since the patient was last here he was in the emergency room on 10/30 he was concerned about swelling in the left foot. They did not do any cultures or x-rays. They changed his antibiotics to  cephalexin. Previous culture showed group B strep. The cephalexin is appropriate as doxycycline has less than predictable coverage. Arrives in clinic today with swelling over this area under the wound. He also has a new wound on the right fifth metatarsal head 11/18; the patient has a difficult wound on the lateral aspect of the left fifth metatarsal head. The wound was almost ballotable last week I opened it slightly expecting to see purulence however there was just bleeding. I cultured this this was negative. X-ray unchanged. We are trying to get an  MRI but I am not sure were going to be able to get this through his insurance. He also has an area on the right lateral fifth metatarsal head this looks healthier 12/3; the patient finally got our MRI. Surprisingly this did not show osteomyelitis. I did show the soft tissue ulceration at the lateral plantar aspect of the fifth metatarsal base with a tiny residual 6 mm abscess overlying the superficial fascia I have tried to culture this area I have not been able to get this to grow anything. Nevertheless the protruding tissue looks aggravated. I suspect we should try to treat the underlying "abscess with broad-spectrum antibiotics. I am going to start him on Levaquin and Flagyl. He has much less edema in his legs and I am going to continue to wrap his legs and see him weekly 12/10. I started Levaquin and Flagyl on him last week. He just picked up the Flagyl apparently there was some delay. The worry is the wound on the left fifth metatarsal base which is substantial and worsening. His foot looks like he inverts at the ankle making this a weightbearing surface. Certainly no improvement in fact I think the measurements of this are somewhat worse. We have been using 12/17; he apparently just got the Levaquin yesterday this is 2 weeks after the fact. He has completed the Flagyl. The area over the left fifth metatarsal base still has protruding granulation  tissue although it does not look quite as bad as it did some weeks ago. He has severe bilateral lymphedema although we have not been treating him for wounds on his legs this is definitely going to require compression. There was so much edema in the left I did not wish to put him in a total contact cast today. I am going to increase his compression from 3-4 layer. The area on the right lateral fifth met head actually look quite good and superficial. 12/23; patient arrived with callus on the right fifth met head and the substantial hyper granulated callused wound on the base of his fifth metatarsal. He says he is completing his Levaquin in 2 days but I do not think that adds up with what I gave him but I will have to double check this. We are using Hydrofera Blue on both areas. My plan is to put the left leg in a cast the week after New Year's 04/06/2020; patient's wounds about the same. Right lateral fifth metatarsal head and left lateral foot over the base of the fifth metatarsal. There is undermining on the left lateral foot which I removed before application of total contact cast continuing with Hydrofera Blue new. Patient tells me he was seen by endocrinology today lab work was done [Dr. Kerr]. Also wondering whether he was referred to cardiology. I went over some lab work from previously does not have chronic renal failure certainly not nephrotic range proteinuria he does have very poorly controlled diabetes but this is not his most updated lab work. Hemoglobin A1c has been over 11 1/10; the patient had a considerable amount of leakage towards mid part of his left foot with macerated skin however the wound surface looks better the area on the right lateral fifth met head is better as well. I am going to change the dressing on the left foot under the total contact cast to silver alginate, continue with Hydrofera Blue on the right. 1/20; patient was in the total contact cast for 10 days. Considerable  amount of drainage although the skin  around the wound does not look too bad on the left foot. The area on the right fifth metatarsal head is closed. Our nursing staff reports large amount of drainage out of the left lateral foot wound 1/25; continues with copious amounts of drainage described by our intake staff. PCR culture I did last week showed E. coli and Enterococcus faecalis and low quantities. Multiple resistance genes documented including extended spectrum beta lactamase, MRSA, MRSE, quinolone, tetracycline. The wound is not quite as good this week as it was 5 days ago but about the same size 2/3; continues with copious amounts of malodorous drainage per our intake nurse. The PCR culture I did 2 weeks ago showed E. coli and low quantities of Enterococcus. There were multiple resistance genes detected. I put Neosporin on him last week although this does not seem to have helped. The wound is slightly deeper today. Offloading continues to be an issue here although with the amount of drainage she has a total contact cast is just not going to work 2/10; moderate amount of drainage. Patient reports he cannot get his stocking on over the dressing. I told him we have to do that the nurse gave him suggestions on how to make this work. The wound is on the bottom and lateral part of his left foot. Is cultured predominantly grew low amounts of Enterococcus, E. coli and anaerobes. There were multiple resistance genes detected including extended spectrum beta lactamase, quinolone, tetracycline. I could not think of an easy oral combination to address this so for now I am going to do topical antibiotics provided by [facility] I think the main agents here are vancomycin and an aminoglycoside. We have to be able to give him access to the wounds to get the topical antibiotic on 2/17; moderate amount of drainage this is unchanged. He has his Keystone topical antibiotic against the deep tissue culture organisms. He  has been using this and changing the dressing daily. Silver alginate on the wound surface. 2/24; using Keystone antibiotic with silver alginate on the top. He had too much drainage for a total contact cast at one point although I think that is improving and I think in the next week or 2 it might be possible to replace a total contact cast I did not do this today. In general the wound surface looks healthy however he continues to have thick rims of skin and subcutaneous tissue around the wide area of the circumference which I debrided 06/04/2020 upon evaluation today patient appears to be doing well in regard to his wound. I do feel like he is showing signs of improvement. There is little bit of callus and dead tissue around the edges of the wound as well as what appears to be a little bit of a sinus tract that is off to the side laterally I would perform debridement to clear that away today. 3/17; left lateral foot. The wound looks about the same as I remember. Not much depth surface looks healthy. No evidence of infection 3/25; left lateral foot. Wound surface looks about the same. Separating epithelium from the circumference. There really is no evidence of infection here however not making progress by my view 3/29; left lateral foot. Surface of the wound again looks reasonably healthy still thick skin and subcutaneous tissue around the wound margins. There is no evidence of infection. One of the concerns being brought up by the nurses has again the amount of drainage vis--vis continued use of a total contact cast 4/5;  left lateral foot at roughly the base of the fifth metatarsal. Nice healthy looking granulated tissue with rims of epithelialization. The overall wound measurements are not any better but the tissue looks healthy. The only concern is the amount of drainage although he has no surrounding maceration with what we have been doing recently to absorb fluid and protect his skin. He also has  lymphedema. 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 He states that the kerlix/coban under the TCC rolled down and could not get it back up. He has been using an offloading scooter and has somehow hurt his right foot using this device. This happened last week. He states that the side of his right foot developed a blister and opened. The top of his foot also has a few small open wounds he thinks is due to his socks rubbing in his shoes. He has not been using any dressings to the wound. He denies purulent drainage, fever/chills or erythema to the wounds. 4/22; patient presents for 1 week follow-up. He developed new wounds to the right foot that were evaluated at last clinic visit. He continues to have a total contact cast to the left leg and he reports no issues. He has been using silver collagen to the right foot wounds with no issues. He denies purulent drainage, fever/chills or erythema to the right foot wounds. He has no complaints today 4/25; patient presents for 1 week follow-up. He has a total contact cast of the left leg and reports no issues. He has been using silver alginate to the right foot wound. He denies purulent drainage, fever/chills or erythema to the right foot wounds. 5/2 patient presents for 1 week follow-up. T contact cast on the left. The wound which is on the base of the plantar foot at the base of the fifth metatarsal otal actually looks quite good and dimensions continue to gradually contract. HOWEVER the area on the right lateral fifth metatarsal head is much larger than what I remember from 2 weeks ago. Once more is he has significant levels of hypergranulation. Noteworthy that he had this same hyper granulated response on his  wound on the left foot at one point in time. So much so that he I thought there was an underlying fluid collection. Based on this I think this just needs debridement. 5/9; the wound on the left actually continues to be gradually smaller with a healthy surface. Slight amount of drainage and maceration of the skin around but not too bad. However he has a large wound over the right fifth metatarsal head very much in the same configuration as his left foot wound was initially. I used silver nitrate to address the hyper granulated tissue no mechanical debridement 5/16; area on the left foot did not look as healthy this week deeper thick surrounding macerated skin and subcutaneous tissue. The area on the right foot fifth met head was about the same The area on the right ankle that we identified last week is completely broken down into an open wound presumably a stocking rubbing issue 5/23; patient has been using a total contact cast to the left side. He has been using silver alginate underneath. He has also been using silver alginate to the right  foot wounds. He has no complaints today. He denies any signs of infection. 5/31; the left-sided wound looks some better measure smaller surface granulation looks better. We have been using silver alginate under the total contact cast The large area on his right fifth met head and right dorsal foot look about the same still using silver alginate 6/6; neither side is good as I was hoping although the surface area dimensions are better. A lot of maceration on his left and right foot around the wound edge. Area on the dorsal right foot looks better. He says he was traveling. I am not sure what does the amount of maceration around the plantar wounds may be drainage issues 6/13; in general the wound surfaces look quite good on both sides. Macerated skin and raised edges around the wound required debridement although in general especially on the left the surface area  seems improved. The area on the right dorsal ankle is about the same I thought this would not be such a problem to close 6/20; not much change in either wound although the one on the right looks a little better. Both wounds have thick macerated edges to the skin requiring debridements. We have been using silver alginate. The area on the dorsal right ankle is still open I thought this would be closed. 6/28; patient comes in today with a marked deterioration in the right foot wound fifth met head. Wide area of exposed bone this is a drastic change from last time. The area on the left there we have been casting is stagnant. We have been using silver alginate in both wound areas. 7/5; bone culture I did for PCR last time was positive for Pseudomonas, group B strep, Enterococcus and Staph aureus. There was no suggestion of methicillin resistance or ampicillin resistant genes. This was resistant to tetracycline however He comes into the clinic today with the area over his right plantar fifth metatarsal head which had been doing so well 2 weeks ago completely necrotic feeling bone. I do not know that this is going to be salvageable. The left foot wound is certainly no smaller but it has a better surface and is superficial. 7/8; patient called in this morning to say that his total contact cast was rubbing against his foot. He states he is doing fine overall. He denies signs of infection. 7/12; continued deterioration in the wound over the right fifth metatarsal head crumbling bone. This is not going to be salvageable. The patient agrees and wants to be referred to Dr. Doran Durand which we will attempt to arrange as soon as possible. I am going to continue him on antibiotics as long as that takes so I will renew those today. The area on the left foot which is the base of the fifth metatarsal continues to look somewhat better. Healthy looking tissue no depth no debridement is necessary here. 7/20; the patient was  kindly seen by Dr. Doran Durand of orthopedics on 10/19/2020. 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 Towards this end we have taken him out of the total contact cast on the left we will put him in a wrap with Hydrofera Blue. As I understand things surgery is planned for 7/21 7/27; patient had his surgery last Thursday. 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 Apparently everything went well we did not still disturb that today The area on the left foot  actually looks quite good. He has been much less mobile which probably explains this he did not seem to do well in the total contact cast secondary to drainage and maceration I think. 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 Fortunately there is no evidence of active infection at this time. No fevers, chills, nausea, vomiting, or diarrhea. Overall I think that he is actually doing extremely well. Nonetheless I do believe that he is staying off of this more following the surgery in his right foot that is the reason the left is doing so great. 8/16; left plantar foot wound. This looks smaller than the last time I saw this he is using Hydrofera Blue. The surgical wound on the right foot is being followed by Dr. Doran Durand we did not look at this today. He has surgical shoes on both feet 8/23; left plantar foot wound not as good this week. Surrounding macerated skin and subcutaneous tissue everything looks moist and wet. I do not think he is offloading this adequately. He is using a surgical shoe Apparently the right foot surgical wound is not open although I did not check his foot 8/31; left plantar foot lateral aspect. Much improved this week. He has no maceration. Some improvement in the surface area of the wound but most impressively the depth is come in we  are using silver alginate. The patient is a Product/process development scientist. He is asked that we write him a letter so he can go back to work. I have also tried to see if we can write something that will allow him to limit the amount of time that he is on his foot at work. 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 Hopefully the latter can be avoided 9/6; I believe the patient missed an appointment last week. He arrives in today with a wound looking roughly the same certainly no better. Undermining laterally and also inferiorly. We used molecuLight today in training with the patient's permission.. We are using silver alginate 9/21 wound is measuring bigger this week although this may have to do with the aggressive circumferential debridement last week in response to the blush fluorescence on the MolecuLight. Culture I did last week showed significant MSSA and E. coli. I put him on Augmentin but he has not started it yet. We are also going to send this for compounded antibiotics at [facility]. There is no evidence of systemic infection 9/29; silver alginate. His Keystone arrived. He is completing Augmentin in 2 days. Offloading in a cam boot. Moderate drainage per our intake staff Electronic Signature(s) Signed: 01/01/2021 3:46:44 PM By: [name] MD Entered By: [name] on 12/31/2020 08:23:09 -------------------------------------------------------------------------------- Physical Exam Details Patient Name: Date of Service: [name], [name] 12/31/2020 7:30 A M Medical Record Number: [id number] Patient Account Number: [id number] Date of Birth/Sex: Treating RN: 03-08-1986 (35 y.o. [name] Mention Primary Care Provider: [name] Other Clinician: Referring Provider: Treating Provider/Extender: [name] in Treatment: 62 Constitutional Sitting or standing Blood Pressure is within  target range for patient.. Pulse regular and within target range for patient.Marland Kitchen Respirations regular, non-labored and within target range.. Temperature is normal and within the target range for the patient.Marland Kitchen Appears in no distress. Notes Wound exam; left plantar foot at the base of the  fifth metatarsal this is a large wound. 2-1/2 cm of undermining from 12-3 o'clock very friable. Leads very easily. No debridement was done. Electronic Signature(s) Signed: 01/01/2021 3:46:44 PM By: [name] MD Entered By: [name] on 12/31/2020 08:24:42 -------------------------------------------------------------------------------- Physician Orders Details Patient Name: Date of Service: [name], [name] 12/31/2020 7:30 A M Medical Record Number: [id number] Patient Account Number: [id number] Date of Birth/Sex: Treating RN: 25-Jul-1985 (35 y.o. [name] Mention Primary Care Provider: [name] Other Clinician: Referring Provider: Treating Provider/Extender: [name] in Treatment: 72 Verbal / Phone Orders: No Diagnosis Coding Follow-up Appointments ppointment in 1 week. - with Dr. Dellia Nims Return A Edema Control - Lymphedema / SCD / Other Bilateral Lower Extremities Elevate legs to the level of the heart or above for 30 minutes daily and/or when sitting, a frequency of: - throughout the day Avoid standing for long periods of time. Exercise regularly Moisturize legs daily. - right leg every night before bed. Compression stocking or Garment 20-30 mm/Hg pressure to: - Apply to right leg in the morning and remove at night. Off-Loading DH Walker Boot to: - Left Foot Other: - minimal weight bearing left foot Additional Orders / Instructions Follow Nutritious Diet Wound Treatment Wound #3 - Foot Wound Laterality: Left, Lateral Cleanser: Soap and Water 1 x Per Day/30 Days Discharge Instructions: May shower and wash wound with dial antibacterial soap and water  prior to dressing change. Cleanser: Wound Cleanser (Generic) 1 x Per Day/30 Days Discharge Instructions: Cleanse the wound with wound cleanser prior to applying a clean dressing using gauze sponges, not tissue or cotton balls. Peri-Wound Care: Zinc Oxide Ointment 30g tube 1 x Per Day/30 Days Discharge Instructions: Apply Zinc Oxide to periwound with each dressing change as needed. Topical: keystone antibiotic compound 1 x Per Day/30 Days Discharge Instructions: thin layer to wound bed Prim Dressing: KerraCel Ag Gelling Fiber Dressing, 4x5 in (silver alginate) (Generic) 1 x Per Day/30 Days ary Discharge Instructions: Apply silver alginate to wound bed as instructed Secondary Dressing: Woven Gauze Sponge, Non-Sterile 4x4 in (Generic) 1 x Per Day/30 Days Discharge Instructions: Apply over primary dressing as directed. Secondary Dressing: Zetuvit Plus 4x8 in (Generic) 1 x Per Day/30 Days Discharge Instructions: Apply over primary dressing as directed. Secondary Dressing: Optifoam Non-Adhesive Dressing, 4x4 in (Generic) 1 x Per Day/30 Days Discharge Instructions: Apply over primary dressing as directed. Secured With: Coban Self-Adherent Wrap 4x5 (in/yd) (Generic) 1 x Per Day/30 Days Discharge Instructions: Secure with Coban as directed. Secured With: [employer], 4.5x3.1 (in/yd) (Generic) 1 x Per Day/30 Days Discharge Instructions: Secure with Kerlix as directed. Secured With: 89M Medipore H Soft Cloth Surgical T 4 x 2 (in/yd) (Generic) 1 x Per Day/30 Days ape Discharge Instructions: Secure dressing with tape as directed. Electronic Signature(s) Signed: 12/31/2020 6:01:32 PM By: Baruch Gouty RN, BSN Signed: 01/01/2021 3:46:44 PM By: [name] MD Entered By: Baruch Gouty on 12/31/2020 08:13:12 -------------------------------------------------------------------------------- Problem List Details Patient Name: Date of Service: [name], [name] 12/31/2020 7:30 A M Medical  Record Number: [id number] Patient Account Number: [id number] Date of Birth/Sex: Treating RN: 1985-11-04 (35 y.o. [name] Mention Primary Care Provider: [name] Other Clinician: Referring Provider: Treating Provider/Extender: [name] in Treatment: 22 Active Problems ICD-10 Encounter Code Description Active Date MDM Diagnosis E11.621 Type 2 diabetes mellitus with foot ulcer 10/24/2019 No Yes L97.528 Non-pressure chronic ulcer of other part of left foot with other specified 10/24/2019 No Yes severity  Inactive Problems ICD-10 Code Description Active Date Inactive Date L97.518 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ity 10/24/2019 10/24/2019 L97.518 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ity 07/14/2020 07/14/2020 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, right ankle and foot 10/24/2019 10/24/2019 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 08/10/2020 08/10/2020 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, left ankle and foot 10/24/2019 10/24/2019 B95.62 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 10/24/2019 10/24/2019 classified elsewhere Resolved Problems Electronic Signature(s) Signed: 01/01/2021 3:46:44 PM By: [name] MD Entered By: [name] on 12/31/2020 08:21:55 -------------------------------------------------------------------------------- Progress Note Details Patient Name: Date of Service: [name], [name] 12/31/2020 7:30 A M Medical Record Number: [id number] Patient Account Number: [id number] Date of Birth/Sex: Treating RN: March 05, 1986 (35 y.o. 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 He comes in for review of a wound over the plantar fifth metatarsal head extending into the lateral part of the foot. He was followed for this previously by his podiatrist Dr. Cornelius Moras. As the patient tells his story he went to see podiatry first for a swelling he developed on the lateral part of his fifth metatarsal head in May. He states this was "open" by podiatry and the area closed. He was followed up in June and it was again opened callus removed and it closed promptly. There were plans being made for surgery on the fifth metatarsal head in June however his blood sugar was apparently too high for anesthesia. Apparently the area was debrided and opened again in June and it is never closed since. Looking over the records from podiatry I am really not able to follow this. It was clear when he was first seen it was before 5/14 at that point he already had a wound. By 5/17 the ulcer was resolved. I do not see anything about a procedure. On 5/28 noted to have pre-ulcerative moderate keratosis. X-ray noted 1/5 contracted toe and tailor's bunion and metatarsal deformity. On a visit date on 09/28/2018 the dorsal part of the left foot it healed and resolved. There was concern about swelling in his lower extremity he was sent to the ER.. As far as I can tell he was seen in the ER on 7/12 with an ulcer on his left foot. A DVT rule out of the left leg was negative. I do not think I have complete records from podiatry but I am not able to verify the procedures this patient states he had. He states after the last procedure the wound has never closed although I am not able to follow this in the records I have from podiatry. He has not had a recent x-ray The patient has been using Neosporin on the wound. He is wearing a Darco shoe. He is still very active up on his foot working and exercising. Past medical  history; type 2 diabetes ketosis-prone, leg swelling with a negative DVT study in July. Non-smoker ABI in our clinic was 0.85 on the left 10/16; substantial wound on the plantar left fifth met head extending laterally almost to the dorsal fifth MTP. We have been using silver alginate we gave him a Darco forefoot off loader. An x-ray did not show evidence of osteomyelitis did  note soft tissue emphysema which I think was due to gas tracking through an open wound. There is no doubt in my mind he requires an MRI 10/23; MRI not booked until 3 November at the earliest this is largely due to his glucose sensor in the right arm. We have been using silver alginate. There has been an improvement 10/29; I am still not exactly sure when his MRI is booked for. He says it is the third but it is the 10th in epic. This definitely needs to be done. He is running a low-grade fever today but no other symptoms. No real improvement in the 1 02/26/2019 patient presents today for a follow-up visit here in our clinic he is last been seen in the clinic on October 29. Subsequently we were working on getting MRI to evaluate and see what exactly was going on and where we would need to go from the standpoint of whether or not he had osteomyelitis and again what treatments were going be required. Subsequently the patient ended up being admitted to the hospital on 02/07/2019 and was discharged on 02/14/2019. This is a somewhat interesting admission with a discharge diagnosis of pneumonia due to COVID-19 although he was positive for COVID-19 when tested at the urgent care but negative x2 when he was actually in the hospital. With that being said he did have acute respiratory failure with hypoxia and it was noted he also have a left foot ulceration with osteomyelitis. With that being said he did require oxygen for his pneumonia and I level 4 L. He was placed on antivirals and steroids for the COVID-19. He was also transferred to the  [facility] at one point. Nonetheless he did subsequently discharged home and since being home has done much better in that regard. The CT angiogram did not show any pulmonary embolism. With regard to the osteomyelitis the patient was placed on vancomycin and Zosyn while in the hospital but has been changed to Augmentin at discharge. It was also recommended that he follow- up with wound care and podiatry. Podiatry however wanted him to see [name] according to the patient prior to them doing anything further. His hemoglobin A1c was 9.9 as noted in the hospital. Have an MRI of the left foot performed while in the hospital on 02/04/2019. 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 There is an overlying plantar open wound noted an abscess tracking back along the lateral aspect of the fifth metatarsal shaft. There is otherwise diffuse cellulitis and mild fasciitis without findings of polymyositis. The patient did have recently pneumonia secondary to COVID-19 I looked in the chart through epic and it does appear that the patient may need to have an additional x-ray just to ensure everything is cleared and that he has no airspace disease prior to putting him into the [name]. 03/05/2019; patient was readmitted to the clinic last week. He was hospitalized twice for a viral upper respiratory tract infection from 11/1 through 11/4 and then 11/5 through 11/12 ultimately this turned out to be Covid pneumonitis. Although he was discharged on oxygen he is not using it. He says he feels fine. He has no exercise limitation no cough no sputum. His O2 sat in our clinic today was 100% on room air. 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  He received Vanco and Zosyn in the hospital and then was discharged on 2  weeks of Augmentin. I do not see any relevant cultures. He was supposed to follow-up with infectious disease but I do not see that he has an appointment. 12/8; patient saw Dr. Novella Olive of infectious disease last week. He felt that he had had adequate antibiotic therapy. He did not go to follow-up with Dr. Amalia Hailey of podiatry and I have again talked to him about the pros and cons of this. He does not want to consider a ray amputation of this time. He is aware of the risks of recurrence, migration etc. He started HBO today and tolerated this well. He can complete the Augmentin that I gave him last week. I have looked over the lab work that Dr. Chana Bode ordered his C-reactive protein was 3.3 and his sedimentation rate was 17. The C-reactive protein is never really been measurably that high in this patient 12/15; not much change in the wound today however he has undermining along the lateral part of the foot again more extensively than last week. He has some rims of epithelialization. We have been using silver alginate. He is undergoing hyperbarics but did not dive today 12/18; in for his obligatory first total contact cast change. Unfortunately there was pus coming from the undermining area around his fifth metatarsal head. This was cultured but will preclude reapplication of a cast. He is seen in conjunction with HBO 12/24; patient had staph lugdunensis in the wound in the undermining area laterally last time. We put him on doxycycline which should have covered this. The wound looks better today. I am going to give him another week of doxycycline before reattempting the total contact cast 12/31; the patient is completing antibiotics. Hemorrhagic debris in the distal part of the wound with some undermining distally. He also had hyper granulation. Extensive debridement with a #5 curette. The infected area that was on the lateral part of the fifth met head is closed over. I do not think he needs any  more antibiotics. Patient was seen prior to HBO. Preparations for a total contact cast were made in the cast will be placed post hyperbarics 04/11/19; once again the patient arrives today without complaint. He had been in a cast all week noted that he had heavy drainage this week. This resulted in large raised areas of macerated tissue around the wound 1/14; wound bed looks better slightly smaller. Hydrofera Blue has been changing himself. He had a heavy drainage last week which caused a lot of maceration around the wound so I took him out of a total contact cast he says the drainage is actually better this week He is seen today in conjunction with HBO 1/21; returns to clinic. He was up in [place] for a day or 2 attending a funeral. He comes back in with the wound larger and with a large area of exposed bone. He had osteomyelitis and septic arthritis of the fifth left metatarsal head while he was in hospital. He received IV antibiotics in the hospital for a prolonged period of time then 3 weeks of Augmentin. Subsequently I gave him 2 weeks of doxycycline for more superficial wound infection. When I saw this last week the wound was smaller the surface of the wound looks satisfactory. 1/28; patient missed hyperbarics today. Bone biopsy I did last time showed Enterococcus faecalis and Staphylococcus lugdunensis . He has a wide area of exposed bone. We are going to use silver alginate as  of today. I had another ethical discussion with the patient. 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 In this situation I think the likelihood of healing this is low. Therefore I have recommended a ray amputation and with the patient's agreement I have referred him to Dr. Doran Durand. The other issue is that his compliance with hyperbarics has been minimal because of his work schedule and given his underlying decision I am going to stop this today READMISSION 10/24/2019 MRI 09/29/2019 left  foot IMPRESSION: 1. Apparent skin ulceration inferior and lateral to the 5th metatarsal base with underlying heterogeneous T2 signal and enhancement in the subcutaneous fat. Small peripherally enhancing fluid collections along the plantar and lateral aspects of the 5th metatarsal base suspicious for abscesses. 2. Interval amputation through the mid 5th metatarsal with nonspecific low-level marrow edema and enhancement. Given the proximity to the adjacent soft tissue inflammatory changes, osteomyelitis cannot be excluded. 3. The additional bones appear unremarkable. MRI 09/29/2019 right foot IMPRESSION: 1. Soft tissue ulceration lateral to the 5th MTP joint. There is low-level T2 hyperintensity within the 4th and 5th metatarsal heads and adjacent proximal phalanges without abnormal T1 signal or cortical destruction. These findings are nonspecific and could be seen with early marrow edema, hyperemia or early osteomyelitis. No evidence of septic joint. 2. Mild tenosynovitis and synovial enhancement associated with the extensor digitorum tendons at the level of the midfoot. 3. Diffuse low-level muscular T2 hyperintensity and enhancement, most consistent with diabetic myopathy. LEFT FOOT BONE Methicillin resistant staphylococcus aureus Staphylococcus lugdunensis MIC MIC CIPROFLOXACIN >=8 RESISTANT Resistant <=0.5 SENSI... Sensitive CLINDAMYCIN <=0.25 SENS... Sensitive >=8 RESISTANT Resistant ERYTHROMYCIN >=8 RESISTANT Resistant >=8 RESISTANT Resistant GENTAMICIN <=0.5 SENSI... Sensitive <=0.5 SENSI... Sensitive Inducible Clindamycin NEGATIVE Sensitive NEGATIVE Sensitive OXACILLIN >=4 RESISTANT Resistant 2 SENSITIVE Sensitive RIFAMPIN <=0.5 SENSI... Sensitive <=0.5 SENSI... Sensitive TETRACYCLINE <=1 SENSITIVE Sensitive <=1 SENSITIVE Sensitive TRIMETH/SULFA <=10 SENSIT Sensitive <=10 SENSIT Sensitive ... Marland Kitchen.. VANCOMYCIN 1 SENSITIVE Sensitive <=0.5 SENSI... Sensitive Right foot  bone . Component 3 wk ago Specimen Description BONE Special Requests RIGHT 4 METATARSAL SAMPLE B Gram Stain NO WBC SEEN NO ORGANISMS SEEN Culture RARE METHICILLIN RESISTANT STAPHYLOCOCCUS AUREUS NO ANAEROBES ISOLATED Performed at [facility], [place] [address], [place], [place] [address] Report Status 10/08/2019 FINAL Organism ID, Bacteria METHICILLIN RESISTANT STAPHYLOCOCCUS AUREUS Resulting Agency [facility] Susceptibility Methicillin resistant staphylococcus aureus MIC CIPROFLOXACIN >=8 RESISTANT Resistant CLINDAMYCIN <=0.25 SENS... Sensitive ERYTHROMYCIN >=8 RESISTANT Resistant GENTAMICIN <=0.5 SENSI... Sensitive Inducible Clindamycin NEGATIVE Sensitive OXACILLIN >=4 RESISTANT Resistant RIFAMPIN <=0.5 SENSI... Sensitive TETRACYCLINE <=1 SENSITIVE Sensitive TRIMETH/SULFA <=10 SENSIT Sensitive ... VANCOMYCIN 1 SENSITIVE Sensitive This is a patient we had in clinic earlier this year with a wound over his left fifth metatarsal head. 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 Doran Durand on 05/16/2019. He seems to have developed problems with wounds on his bilateral feet in June 2021 although he may have had problems earlier than this. He was in an urgent care with a right foot ulcer on 09/26/2019 and given a course of doxycycline. This was apparently after having trouble getting into see orthopedics. He was seen by podiatry on 09/28/2019 noted to have bilateral lower extremity ulcers including the left lateral fifth metatarsal base and the  right subfifth met head. It was noted that had purulent drainage at that time. He required hospitalization from 6/20 through 7/2. This was because of worsening right foot  wounds. He underwent bilateral operative incision and drainage and bone biopsies bilaterally. Culture results are listed above. He has been referred back to clinic by Dr. Jacqualyn Posey of podiatry. He is also followed by Dr. Megan Salon who saw him yesterday. He was discharged from hospital on Zyvox Flagyl and Levaquin and yesterday changed to doxycycline Flagyl and Levaquin. His inflammatory markers on 6/26 showed a sedimentation rate of 129 and a C-reactive protein of 5. This is improved to 14 and 1.3 respectively. This would indicate improvement. ABIs in our clinic today were 1.23 on the right and 1.20 on the left 11/01/2019 on evaluation today patient appears to be doing fairly well in regard to the wounds on his feet at this point. Fortunately there is no signs of active infection at this time. No fevers, chills, nausea, vomiting, or diarrhea. He currently is seeing infectious disease and still under their care at this point. Subsequently he also has both wounds which she has not been using collagen on as he did not receive that in his packaging he did not call us and let us know that. Apparently that just was missed on the order. Nonetheless we will get that straightened out today. 8/9-Patient returns for bilateral foot wounds, using Prisma with hydrogel moistened dressings, and the wounds appear stable. Patient using surgical shoes, avoiding much pressure or weightbearing as much as possible 8/16; patient has bilateral foot wounds. 1 on the right lateral foot proximally the other is on the left mid lateral foot. Both required debridement of callus and thick skin around the wounds. We have been using silver collagen 8/27; patient has bilateral lateral foot wounds. The area on the left substantially surrounded by callus and dry skin. This was removed from the wound edge. The underlying wound is small. The area on the right measured somewhat smaller today. We've been using silver collagen the patient was  on antibiotics for underlying osteomyelitis in the left foot. Unfortunately I did not update his antibiotics during today's visit. 9/10 I reviewed Dr. Hale Bogus last notes he felt he had completed antibiotics his inflammatory markers were reasonably well controlled. He has a small wound on the lateral left foot and a tiny area on the right which is just above closed. He is using Hydrofera Blue with border foam he has bilateral surgical shoes 9/24; 2 week f/u. doing well. right foot is closed. left foot still undermined. 10/14; right foot remains closed at the fifth met head. The area over the base of the left fifth metatarsal has a small open area but considerable undermining towards the plantar foot. Thick callus skin around this suggests an adequate pressure relief. We have talked about this. He says he is going to go back into his cam boot. I suggested a total contact cast he did not seem enamored with this suggestion 10/26; left foot base of the fifth metatarsal. Same condition as last time. He has skin over the area with an open wound however the skin is not adherent. He went to see Dr. Earleen Newport who did an x-ray and culture of his foot I have not reviewed the x-ray but the patient was not told anything. He is on doxycycline 11/11; since the patient was last here he was in the emergency room on 10/30 he was concerned about swelling in the left foot. They did  not do any cultures or x-rays. They changed his antibiotics to cephalexin. Previous culture showed group B strep. The cephalexin is appropriate as doxycycline has less than predictable coverage. Arrives in clinic today with swelling over this area under the wound. He also has a new wound on the right fifth metatarsal head 11/18; the patient has a difficult wound on the lateral aspect of the left fifth metatarsal head. The wound was almost ballotable last week I opened it slightly expecting to see purulence however there was just bleeding. I  cultured this this was negative. X-ray unchanged. We are trying to get an MRI but I am not sure were going to be able to get this through his insurance. He also has an area on the right lateral fifth metatarsal head this looks healthier 12/3; the patient finally got our MRI. Surprisingly this did not show osteomyelitis. I did show the soft tissue ulceration at the lateral plantar aspect of the fifth metatarsal base with a tiny residual 6 mm abscess overlying the superficial fascia I have tried to culture this area I have not been able to get this to grow anything. Nevertheless the protruding tissue looks aggravated. I suspect we should try to treat the underlying "abscess with broad-spectrum antibiotics. I am going to start him on Levaquin and Flagyl. He has much less edema in his legs and I am going to continue to wrap his legs and see him weekly 12/10. I started Levaquin and Flagyl on him last week. He just picked up the Flagyl apparently there was some delay. The worry is the wound on the left fifth metatarsal base which is substantial and worsening. His foot looks like he inverts at the ankle making this a weightbearing surface. Certainly no improvement in fact I think the measurements of this are somewhat worse. We have been using 12/17; he apparently just got the Levaquin yesterday this is 2 weeks after the fact. He has completed the Flagyl. The area over the left fifth metatarsal base still has protruding granulation tissue although it does not look quite as bad as it did some weeks ago. He has severe bilateral lymphedema although we have not been treating him for wounds on his legs this is definitely going to require compression. There was so much edema in the left I did not wish to put him in a total contact cast today. I am going to increase his compression from 3-4 layer. The area on the right lateral fifth met head actually look quite good and superficial. 12/23; patient arrived with  callus on the right fifth met head and the substantial hyper granulated callused wound on the base of his fifth metatarsal. He says he is completing his Levaquin in 2 days but I do not think that adds up with what I gave him but I will have to double check this. We are using Hydrofera Blue on both areas. My plan is to put the left leg in a cast the week after New Year's 04/06/2020; patient's wounds about the same. Right lateral fifth metatarsal head and left lateral foot over the base of the fifth metatarsal. There is undermining on the left lateral foot which I removed before application of total contact cast continuing with Hydrofera Blue new. Patient tells me he was seen by endocrinology today lab work was done [Dr. Kerr]. Also wondering whether he was referred to cardiology. I went over some lab work from previously does not have chronic renal failure certainly not nephrotic range proteinuria  he does have very poorly controlled diabetes but this is not his most updated lab work. Hemoglobin A1c has been over 11 1/10; the patient had a considerable amount of leakage towards mid part of his left foot with macerated skin however the wound surface looks better the area on the right lateral fifth met head is better as well. I am going to change the dressing on the left foot under the total contact cast to silver alginate, continue with Hydrofera Blue on the right. 1/20; patient was in the total contact cast for 10 days. Considerable amount of drainage although the skin around the wound does not look too bad on the left foot. The area on the right fifth metatarsal head is closed. Our nursing staff reports large amount of drainage out of the left lateral foot wound 1/25; continues with copious amounts of drainage described by our intake staff. PCR culture I did last week showed E. coli and Enterococcus faecalis and low quantities. Multiple resistance genes documented including extended spectrum beta  lactamase, MRSA, MRSE, quinolone, tetracycline. The wound is not quite as good this week as it was 5 days ago but about the same size 2/3; continues with copious amounts of malodorous drainage per our intake nurse. The PCR culture I did 2 weeks ago showed E. coli and low quantities of Enterococcus. There were multiple resistance genes detected. I put Neosporin on him last week although this does not seem to have helped. The wound is slightly deeper today. Offloading continues to be an issue here although with the amount of drainage she has a total contact cast is just not going to work 2/10; moderate amount of drainage. Patient reports he cannot get his stocking on over the dressing. I told him we have to do that the nurse gave him suggestions on how to make this work. The wound is on the bottom and lateral part of his left foot. Is cultured predominantly grew low amounts of Enterococcus, E. coli and anaerobes. There were multiple resistance genes detected including extended spectrum beta lactamase, quinolone, tetracycline. I could not think of an easy oral combination to address this so for now I am going to do topical antibiotics provided by [facility] I think the main agents here are vancomycin and an aminoglycoside. We have to be able to give him access to the wounds to get the topical antibiotic on 2/17; moderate amount of drainage this is unchanged. He has his Keystone topical antibiotic against the deep tissue culture organisms. He has been using this and changing the dressing daily. Silver alginate on the wound surface. 2/24; using Keystone antibiotic with silver alginate on the top. He had too much drainage for a total contact cast at one point although I think that is improving and I think in the next week or 2 it might be possible to replace a total contact cast I did not do this today. In general the wound surface looks healthy however he continues to have thick rims of skin and subcutaneous  tissue around the wide area of the circumference which I debrided 06/04/2020 upon evaluation today patient appears to be doing well in regard to his wound. I do feel like he is showing signs of improvement. There is little bit of callus and dead tissue around the edges of the wound as well as what appears to be a little bit of a sinus tract that is off to the side laterally I would perform debridement to clear that away today. 3/17;  left lateral foot. The wound looks about the same as I remember. Not much depth surface looks healthy. No evidence of infection 3/25; left lateral foot. Wound surface looks about the same. Separating epithelium from the circumference. There really is no evidence of infection here however not making progress by my view 3/29; left lateral foot. Surface of the wound again looks reasonably healthy still thick skin and subcutaneous tissue around the wound margins. There is no evidence of infection. One of the concerns being brought up by the nurses has again the amount of drainage vis--vis continued use of a total contact cast 4/5; left lateral foot at roughly the base of the fifth metatarsal. Nice healthy looking granulated tissue with rims of epithelialization. The overall wound measurements are not any better but the tissue looks healthy. The only concern is the amount of drainage although he has no surrounding maceration with what we have been doing recently to absorb fluid and protect his skin. He also has lymphedema. 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 He states that the kerlix/coban under the TCC rolled down and could not get it back up. He has been using an offloading scooter and has somehow hurt his right foot using  this device. This happened last week. He states that the side of his right foot developed a blister and opened. The top of his foot also has a few small open wounds he thinks is due to his socks rubbing in his shoes. He has not been using any dressings to the wound. He denies purulent drainage, fever/chills or erythema to the wounds. 4/22; patient presents for 1 week follow-up. He developed new wounds to the right foot that were evaluated at last clinic visit. He continues to have a total contact cast to the left leg and he reports no issues. He has been using silver collagen to the right foot wounds with no issues. He denies purulent drainage, fever/chills or erythema to the right foot wounds. He has no complaints today 4/25; patient presents for 1 week follow-up. He has a total contact cast of the left leg and reports no issues. He has been using silver alginate to the right foot wound. He denies purulent drainage, fever/chills or erythema to the right foot wounds. 5/2 patient presents for 1 week follow-up. T contact cast on the left. The wound which is on the base of the plantar foot at the base of the fifth metatarsal otal actually looks quite good and dimensions continue to gradually contract. HOWEVER the area on the right lateral fifth metatarsal head is much larger than what I remember from 2 weeks ago. Once more is he has significant levels of hypergranulation. Noteworthy that he had this same hyper granulated response on his wound on the left foot at one point in time. So much so that he I thought there was an underlying fluid collection. Based on this I think this just needs debridement. 5/9; the wound on the left actually continues to be gradually smaller with a healthy surface. Slight amount of drainage and maceration of the skin around but not too bad. However he has a large wound over the right fifth metatarsal head very much in the same configuration as his left foot  wound was  initially. I used silver nitrate to address the hyper granulated tissue no mechanical debridement 5/16; area on the left foot did not look as healthy this week deeper thick surrounding macerated skin and subcutaneous tissue. oo The area on the right foot fifth met head was about the same oo The area on the right ankle that we identified last week is completely broken down into an open wound presumably a stocking rubbing issue 5/23; patient has been using a total contact cast to the left side. He has been using silver alginate underneath. He has also been using silver alginate to the right foot wounds. He has no complaints today. He denies any signs of infection. 5/31; the left-sided wound looks some better measure smaller surface granulation looks better. We have been using silver alginate under the total contact cast oo The large area on his right fifth met head and right dorsal foot look about the same still using silver alginate 6/6; neither side is good as I was hoping although the surface area dimensions are better. A lot of maceration on his left and right foot around the wound edge. Area on the dorsal right foot looks better. He says he was traveling. I am not sure what does the amount of maceration around the plantar wounds may be drainage issues 6/13; in general the wound surfaces look quite good on both sides. Macerated skin and raised edges around the wound required debridement although in general especially on the left the surface area seems improved. oo The area on the right dorsal ankle is about the same I thought this would not be such a problem to close 6/20; not much change in either wound although the one on the right looks a little better. Both wounds have thick macerated edges to the skin requiring debridements. We have been using silver alginate. The area on the dorsal right ankle is still open I thought this would be closed. 6/28; patient comes in today with a marked  deterioration in the right foot wound fifth met head. Wide area of exposed bone this is a drastic change from last time. The area on the left there we have been casting is stagnant. We have been using silver alginate in both wound areas. 7/5; bone culture I did for PCR last time was positive for Pseudomonas, group B strep, Enterococcus and Staph aureus. There was no suggestion of methicillin resistance or ampicillin resistant genes. This was resistant to tetracycline however He comes into the clinic today with the area over his right plantar fifth metatarsal head which had been doing so well 2 weeks ago completely necrotic feeling bone. I do not know that this is going to be salvageable. The left foot wound is certainly no smaller but it has a better surface and is superficial. 7/8; patient called in this morning to say that his total contact cast was rubbing against his foot. He states he is doing fine overall. He denies signs of infection. 7/12; continued deterioration in the wound over the right fifth metatarsal head crumbling bone. This is not going to be salvageable. The patient agrees and wants to be referred to Dr. Doran Durand which we will attempt to arrange as soon as possible. I am going to continue him on antibiotics as long as that takes so I will renew those today. The area on the left foot which is the base of the fifth metatarsal continues to look somewhat better. Healthy looking tissue no depth no debridement is necessary  here. 7/20; the patient was kindly seen by Dr. Doran Durand of orthopedics on 10/19/2020. 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 Towards this end we have taken him out of the total contact cast on the left we will put him in a wrap with Hydrofera Blue. As I understand things surgery is planned for 7/21 7/27; patient had his surgery last Thursday. 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 Apparently everything went well we did not still disturb that today The area on the left foot actually looks quite good. He has been much less mobile which probably explains this he did not seem to do well in the total contact cast secondary to drainage and maceration I think. 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 Fortunately there is no evidence of active infection at this time. No fevers, chills, nausea, vomiting, or diarrhea. Overall I think that he is actually doing extremely well. Nonetheless I do believe that he is staying off of this more following the surgery in his right foot that is the reason the left is doing so great. 8/16; left plantar foot wound. This looks smaller than the last time I saw this he is using Hydrofera Blue. The surgical wound on the right foot is being followed by Dr. Doran Durand we did not look at this today. He has surgical shoes on both feet 8/23; left plantar foot wound not as good this week. Surrounding macerated skin and subcutaneous tissue everything looks moist and wet. I do not think he is offloading this adequately. He is using a surgical shoe Apparently the right foot surgical wound is not open although I did not check his foot 8/31; left plantar foot lateral aspect. Much improved this week. He has no maceration. Some improvement in the surface area of the wound but most impressively the depth is come in we are using silver alginate. The patient is a Product/process development scientist. He is asked that we write him a letter so he can go back to work. I have also tried to see if we can write something that will allow him to limit the amount of time that he is on his foot at work. 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 Hopefully the latter can be avoided 9/6; I believe  the patient missed an appointment last week. He arrives in today with a wound looking roughly the same certainly no better. Undermining laterally and also inferiorly. We used molecuLight today in training with the patient's permission.. We are using silver alginate 9/21 wound is measuring bigger this week although this may have to do with the aggressive circumferential debridement last week in response to the blush fluorescence on the MolecuLight. Culture I did last week showed significant MSSA and E. coli. I put him on Augmentin but he has not started it yet. We are also going to send this for compounded antibiotics at [facility]. There is no evidence of systemic infection 9/29; silver alginate. His Keystone arrived. He is completing Augmentin in 2 days. Offloading in a cam boot. Moderate drainage per our intake staff Objective Constitutional Sitting or standing Blood Pressure is within target range for patient.. Pulse regular and within target  range for patient.Marland Kitchen Respirations regular, non-labored and within target range.. Temperature is normal and within the target range for the patient.Marland Kitchen Appears in no distress. Vitals Time Taken: 7:48 AM, Height: 77 in, Source: Stated, Weight: 280 lbs, Source: Stated, BMI: 33.2, Temperature: 98.7 F, Pulse: 92 bpm, Respiratory Rate: 18 breaths/min, Blood Pressure: 141/93 mmHg, Capillary Blood Glucose: 130 mg/dl. General Notes: glucose per pt report this am General Notes: Wound exam; left plantar foot at the base of the fifth metatarsal this is a large wound. 2-1/2 cm of undermining from 12-3 o'clock very friable. Leads very easily. No debridement was done. Integumentary (Hair, Skin) Wound #3 status is Open. Original cause of wound was Trauma. The date acquired was: 10/02/2019. The wound has been in treatment 62 weeks. The wound is located on the Left,Lateral Foot. The wound measures 3.4cm length x 3.2cm width x 0.7cm depth; 8.545cm^2 area and 5.982cm^3 volume.  There is Fat Layer (Subcutaneous Tissue) exposed. There is no tunneling noted, however, there is undermining starting at 12:00 and ending at 9:00 with a maximum distance of 1.8cm. There is a medium amount of purulent drainage noted. The wound margin is thickened. There is large (67-100%) red, pink, friable granulation within the wound bed. There is a small (1-33%) amount of necrotic tissue within the wound bed including Adherent Slough. 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 - with Dr. Dellia Nims Edema Control - Lymphedema / SCD / Other: Elevate legs to the level of the heart or above for 30 minutes daily and/or when sitting, a frequency of: - throughout the day Avoid standing for long periods of time. Exercise regularly Moisturize legs daily. - right leg every night before bed. Compression stocking or Garment 20-30 mm/Hg pressure to: - Apply to right leg in the morning and remove at night. Off-Loading: DH Walker Boot to: - Left Foot Other: - minimal weight bearing left foot Additional Orders / Instructions: Follow Nutritious Diet WOUND #3: - Foot Wound Laterality: Left, Lateral Cleanser: Soap and Water 1 x Per Day/30 Days Discharge Instructions: May shower and wash wound with dial antibacterial soap and water prior to dressing change. Cleanser: Wound Cleanser (Generic) 1 x Per Day/30 Days Discharge Instructions: Cleanse the wound with wound cleanser prior to applying a clean dressing using gauze sponges, not tissue or cotton balls. Peri-Wound Care: Zinc Oxide Ointment 30g tube 1 x Per Day/30 Days Discharge Instructions: Apply Zinc Oxide to periwound with each dressing change as needed. Topical: keystone antibiotic compound 1 x Per Day/30 Days Discharge Instructions: thin layer to wound bed Prim Dressing:  KerraCel Ag Gelling Fiber Dressing, 4x5 in (silver alginate) (Generic) 1 x Per Day/30 Days ary Discharge Instructions: Apply silver alginate to wound bed as instructed Secondary Dressing: Woven Gauze Sponge, Non-Sterile 4x4 in (Generic) 1 x Per Day/30 Days Discharge Instructions: Apply over primary dressing as directed. Secondary Dressing: Zetuvit Plus 4x8 in (Generic) 1 x Per Day/30 Days Discharge Instructions: Apply over primary dressing as directed. Secondary Dressing: Optifoam Non-Adhesive Dressing, 4x4 in (Generic) 1 x Per Day/30 Days Discharge Instructions: Apply over primary dressing as directed. Secured With: Coban Self-Adherent Wrap 4x5 (in/yd) (Generic) 1 x Per Day/30 Days Discharge Instructions: Secure with Coban as directed. Secured With: [employer], 4.5x3.1 (in/yd) (Generic) 1 x Per Day/30 Days Discharge Instructions: Secure with Kerlix as directed. Secured With: 42M Medipore H Soft  Cloth Surgical T 4 x 2 (in/yd) (Generic) 1 x Per Day/30 Days ape Discharge Instructions: Secure dressing with tape as directed. 1. I have continued with the silver alginate. There is no exposed bone 2. I would love to give the [facility] about a week or 2 to see what happens to the surface of this. This was based on a deep tissue culture 3. The undermining area from about 12-3 o'clock may need to be removed this is a major debridement I did not do this today 4. I am still thinking about returning to a total contact cast but I do need to see if we can reduce the amount of drainage hopefully with the [facility] and the antibiotics he is already received 5. Cannot rule out additional advanced Electronic Signature(s) Signed: 01/01/2021 3:46:44 PM By: [name] MD Entered By: [name] on 12/31/2020 08:27:29 -------------------------------------------------------------------------------- SuperBill Details Patient Name: Date of Service: [name], [name] 12/31/2020 Medical Record Number:  [id number] Patient Account Number: [id number] Date of Birth/Sex: Treating RN: [birth year] (35 y.o. [name] Mention Primary Care Provider: [name] Other Clinician: Referring Provider: Treating Provider/Extender: [name] in Treatment: 62 Diagnosis Coding ICD-10 Codes Code Description E11.621 Type 2 diabetes mellitus with foot ulcer L97.528 Non-pressure chronic ulcer of other part of left foot with other specified severity Facility Procedures CPT4 Code: [id number] Description: 9213 - WOUND CARE VISIT-LEV 3 EST PT Modifier: Quantity: 1 Physician Procedures : CPT4 Code Description Modifier [id number] - WC PHYS LEVEL 3 - EST PT ICD-10 Diagnosis Description E11.621 Type 2 diabetes mellitus with foot ulcer L97.528 Non-pressure chronic ulcer of other part of left foot with other specified severity Quantity: 1 Electronic Signature(s) Signed: 12/31/2020 6:01:32 PM By: Baruch Gouty RN, BSN Signed: 01/01/2021 3:46:44 PM By: [name] MD Entered By: Baruch Gouty on 12/31/2020 08:30:12

## 2021-01-06 ENCOUNTER — Encounter (HOSPITAL_BASED_OUTPATIENT_CLINIC_OR_DEPARTMENT_OTHER): Payer: BC Managed Care – PPO | Attending: Internal Medicine | Admitting: Internal Medicine

## 2021-01-06 ENCOUNTER — Other Ambulatory Visit: Payer: Self-pay

## 2021-01-06 DIAGNOSIS — E11621 Type 2 diabetes mellitus with foot ulcer: Secondary | ICD-10-CM | POA: Diagnosis present

## 2021-01-06 DIAGNOSIS — L97528 Non-pressure chronic ulcer of other part of left foot with other specified severity: Secondary | ICD-10-CM | POA: Diagnosis not present

## 2021-01-07 NOTE — Progress Notes (Signed)
[name], [name] ([id number]) Visit Report for 01/06/2021 Debridement Details Patient Name: Date of Service: [name] 01/06/2021 7:30 A M Medical Record Number: [id number] Patient Account Number: [id number] Date of Birth/Sex: Treating RN: 21-Apr-1985 (35 y.o. [name] Primary Care Provider: Seward [name] Other Clinician: Referring Provider: Treating Provider/Extender: [name] in Treatment: 62 Debridement Performed for Assessment: Wound #3 Left,Lateral Foot Performed By: Physician [name]., MD Debridement Type: Debridement Severity of Tissue Pre Debridement: Fat layer exposed Level of Consciousness (Pre-procedure): Awake and Alert Pre-procedure Verification/Time Out Yes - 08:10 Taken: Start Time: 08:10 T Area Debrided (L x W): otal 1 (cm) x 3 (cm) = 3 (cm) Tissue and other material debrided: Non-Viable, Callus, Subcutaneous, Skin: Dermis Level: Skin/Subcutaneous Tissue Debridement Description: Excisional Instrument: Blade, Forceps Bleeding: Moderate Hemostasis Achieved: Pressure End Time: 08:11 Procedural Pain: 0 Post Procedural Pain: 0 Response to Treatment: Procedure was tolerated well Level of Consciousness (Post- Awake and Alert procedure): Post Debridement Measurements of Total Wound Length: (cm) 4 Width: (cm) 3.7 Depth: (cm) 0.4 Volume: (cm) 4.65 Character of Wound/Ulcer Post Debridement: Stable Severity of Tissue Post Debridement: Fat layer exposed Post Procedure Diagnosis Same as Pre-procedure Electronic Signature(s) Signed: 01/07/2021 5:45:05 PM By: Max Ham MD Signed: 01/07/2021 6:21:03 PM By: Max Hurst RN, BSN Entered By: [name] on 01/06/2021 08:17:29 -------------------------------------------------------------------------------- HPI Details Patient Name: Date of Service: [name], [name] 01/06/2021 7:30 A M Medical Record Number: [id number] Patient Account Number: [id number] Date  of Birth/Sex: Treating RN: [birth year] (35 y.o. 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 He comes in for review of a wound over the plantar fifth metatarsal head extending into the lateral part of the foot. He was followed for this previously by his podiatrist Dr. Cornelius [name]. As the patient tells his story he went to see podiatry first for a swelling he developed on the lateral part of his fifth metatarsal head in May. He states this was "open" by podiatry and the area closed. He was followed up in June and it was again opened callus removed and it closed promptly. There were plans being made for surgery on the fifth metatarsal head in June however his blood sugar was apparently too high for anesthesia. Apparently the area was debrided and opened again in June and it is never closed since. Looking over the records from podiatry I am really not able to follow this. It was clear when he was first seen it was before 5/14 at that point he already had a wound. By 5/17 the ulcer was resolved. I do not see anything about a procedure. On 5/28 noted to have pre-ulcerative moderate keratosis. X-ray noted 1/5 contracted toe and tailor's bunion and metatarsal deformity. On a visit date on 09/28/2018 the dorsal part of the left foot it healed and resolved. There was concern about swelling in his lower extremity he was sent to the ER.. As far as I can tell he was seen in the ER on 7/12 with an ulcer on his left foot. A DVT rule out of the left leg was negative. I do not think I have complete records from podiatry but I am not able to verify the procedures this patient states he had. He states after the last procedure  the wound has never  closed although I am not able to follow this in the records I have from podiatry. He has not had a recent x-ray The patient has been using Neosporin on the wound. He is wearing a Darco shoe. He is still very active up on his foot working and exercising. Past medical history; type 2 diabetes ketosis-prone, leg swelling with a negative DVT study in July. Non-smoker ABI in our clinic was 0.85 on the left 10/16; substantial wound on the plantar left fifth met head extending laterally almost to the dorsal fifth MTP. We have been using silver alginate we gave him a Darco forefoot off loader. An x-ray did not show evidence of osteomyelitis did note soft tissue emphysema which I think was due to gas tracking through an open wound. There is no doubt in my mind he requires an MRI 10/23; MRI not booked until 3 November at the earliest this is largely due to his glucose sensor in the right arm. We have been using silver alginate. There has been an improvement 10/29; I am still not exactly sure when his MRI is booked for. He says it is the third but it is the 10th in epic. This definitely needs to be done. He is running a low-grade fever today but no other symptoms. No real improvement in the 1 02/26/2019 patient presents today for a follow-up visit here in our clinic he is last been seen in the clinic on October 29. Subsequently we were working on getting MRI to evaluate and see what exactly was going on and where we would need to go from the standpoint of whether or not he had osteomyelitis and again what treatments were going be required. Subsequently the patient ended up being admitted to the hospital on 02/07/2019 and was discharged on 02/14/2019. This is a somewhat interesting admission with a discharge diagnosis of pneumonia due to COVID-19 although he was positive for COVID-19 when tested at the urgent care but negative x2 when he was actually in the hospital. With that being said he did have acute  respiratory failure with hypoxia and it was noted he also have a left foot ulceration with osteomyelitis. With that being said he did require oxygen for his pneumonia and I level 4 L. He was placed on antivirals and steroids for the COVID-19. He was also transferred to the [facility] at one point. Nonetheless he did subsequently discharged home and since being home has done much better in that regard. The CT angiogram did not show any pulmonary embolism. With regard to the osteomyelitis the patient was placed on vancomycin and Zosyn while in the hospital but has been changed to Augmentin at discharge. It was also recommended that he follow- up with wound care and podiatry. Podiatry however wanted him to see [name] according to the patient prior to them doing anything further. His hemoglobin A1c was 9.9 as noted in the hospital. Have an MRI of the left foot performed while in the hospital on 02/04/2019. 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 There is an overlying plantar open wound noted an abscess tracking back along the lateral aspect of the fifth metatarsal shaft. There is otherwise diffuse cellulitis and mild fasciitis without findings of polymyositis. The patient did have recently pneumonia secondary to COVID-19 I looked in the chart through epic and it does appear that the patient may need to have an  additional x-ray just to ensure everything is cleared and that he has no airspace disease prior to putting him into the [name]. 03/05/2019; patient was readmitted to the clinic last week. He was hospitalized twice for a viral upper respiratory tract infection from 11/1 through 11/4 and then 11/5 through 11/12 ultimately this turned out to be Covid pneumonitis. Although he was discharged on oxygen he is not using it. He says he feels fine. He has no exercise limitation no cough no sputum. His O2 sat  in our clinic today was 100% on room air. 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 He received Vanco and Zosyn in the hospital and then was discharged on 2 weeks of Augmentin. I do not see any relevant cultures. He was supposed to follow-up with infectious disease but I do not see that he has an appointment. 12/8; patient saw Dr. Novella Olive of infectious disease last week. He felt that he had had adequate antibiotic therapy. He did not go to follow-up with Dr. Amalia Hailey of podiatry and I have again talked to him about the pros and cons of this. He does not want to consider a ray amputation of this time. He is aware of the risks of recurrence, migration etc. He started HBO today and tolerated this well. He can complete the Augmentin that I gave him last week. I have looked over the lab work that Dr. Chana Bode ordered his C-reactive protein was 3.3 and his sedimentation rate was 17. The C-reactive protein is never really been measurably that high in this patient 12/15; not much change in the wound today however he has undermining along the lateral part of the foot again more extensively than last week. He has some rims of epithelialization. We have been using silver alginate. He is undergoing hyperbarics but did not dive today 12/18; in for his obligatory first total contact cast change. Unfortunately there was pus coming from the undermining area around his fifth metatarsal head. This was cultured but will preclude reapplication of a cast. He is seen in conjunction with HBO 12/24; patient had staph lugdunensis in the wound in the undermining area laterally last time. We put him on doxycycline which should have covered this. The wound looks better today. I am going to give him another week of doxycycline before reattempting the total contact cast 12/31; the patient is completing antibiotics.  Hemorrhagic debris in the distal part of the wound with some undermining distally. He also had hyper granulation. Extensive debridement with a #5 curette. The infected area that was on the lateral part of the fifth met head is closed over. I do not think he needs any more antibiotics. Patient was seen prior to HBO. Preparations for a total contact cast were made in the cast will be placed post hyperbarics 04/11/19; once again the patient arrives today without complaint. He had been in a cast all week noted that he had heavy drainage this week. This resulted in large raised areas of macerated tissue around the wound 1/14; wound bed looks better slightly smaller. Hydrofera Blue has been changing himself. He had a heavy drainage last week which caused a lot of maceration around the wound so I took him out of a total contact cast he says the drainage is actually better this week He is seen today in conjunction with HBO 1/21; returns to clinic. He was up in [place] for  a day or 2 attending a funeral. He comes back in with the wound larger and with a large area of exposed bone. He had osteomyelitis and septic arthritis of the fifth left metatarsal head while he was in hospital. He received IV antibiotics in the hospital for a prolonged period of time then 3 weeks of Augmentin. Subsequently I gave him 2 weeks of doxycycline for more superficial wound infection. When I saw this last week the wound was smaller the surface of the wound looks satisfactory. 1/28; patient missed hyperbarics today. Bone biopsy I did last time showed Enterococcus faecalis and Staphylococcus lugdunensis . He has a wide area of exposed bone. We are going to use silver alginate as of today. I had another ethical discussion with the patient. 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 In this situation I think the likelihood of healing this is low. Therefore I have recommended a ray amputation and with  the patient's agreement I have referred him to Dr. Doran Durand. The other issue is that his compliance with hyperbarics has been minimal because of his work schedule and given his underlying decision I am going to stop this today READMISSION 10/24/2019 MRI 09/29/2019 left foot IMPRESSION: 1. Apparent skin ulceration inferior and lateral to the 5th metatarsal base with underlying heterogeneous T2 signal and enhancement in the subcutaneous fat. Small peripherally enhancing fluid collections along the plantar and lateral aspects of the 5th metatarsal base suspicious for abscesses. 2. Interval amputation through the mid 5th metatarsal with nonspecific low-level marrow edema and enhancement. Given the proximity to the adjacent soft tissue inflammatory changes, osteomyelitis cannot be excluded. 3. The additional bones appear unremarkable. MRI 09/29/2019 right foot IMPRESSION: 1. Soft tissue ulceration lateral to the 5th MTP joint. There is low-level T2 hyperintensity within the 4th and 5th metatarsal heads and adjacent proximal phalanges without abnormal T1 signal or cortical destruction. These findings are nonspecific and could be seen with early marrow edema, hyperemia or early osteomyelitis. No evidence of septic joint. 2. Mild tenosynovitis and synovial enhancement associated with the extensor digitorum tendons at the level of the midfoot. 3. Diffuse low-level muscular T2 hyperintensity and enhancement, most consistent with diabetic myopathy. LEFT FOOT BONE Methicillin resistant staphylococcus aureus Staphylococcus lugdunensis MIC MIC CIPROFLOXACIN >=8 RESISTANT Resistant <=0.5 SENSI... Sensitive CLINDAMYCIN <=0.25 SENS... Sensitive >=8 RESISTANT Resistant ERYTHROMYCIN >=8 RESISTANT Resistant >=8 RESISTANT Resistant GENTAMICIN <=0.5 SENSI... Sensitive <=0.5 SENSI... Sensitive Inducible Clindamycin NEGATIVE Sensitive NEGATIVE Sensitive OXACILLIN >=4 RESISTANT Resistant 2 SENSITIVE  Sensitive RIFAMPIN <=0.5 SENSI... Sensitive <=0.5 SENSI... Sensitive TETRACYCLINE <=1 SENSITIVE Sensitive <=1 SENSITIVE Sensitive TRIMETH/SULFA <=10 SENSIT Sensitive <=10 SENSIT Sensitive ... Marland Kitchen.. VANCOMYCIN 1 SENSITIVE Sensitive <=0.5 SENSI... Sensitive Right foot bone . Component 3 wk ago Specimen Description BONE Special Requests RIGHT 4 METATARSAL SAMPLE B Gram Stain NO WBC SEEN NO ORGANISMS SEEN Culture RARE METHICILLIN RESISTANT STAPHYLOCOCCUS AUREUS NO ANAEROBES ISOLATED Performed at [facility], [place] [address] [place], [place] [address] Report Status 10/08/2019 FINAL Organism ID, Bacteria METHICILLIN RESISTANT STAPHYLOCOCCUS AUREUS Resulting Agency [facility] Susceptibility Methicillin resistant staphylococcus aureus MIC CIPROFLOXACIN >=8 RESISTANT Resistant CLINDAMYCIN <=0.25 SENS... Sensitive ERYTHROMYCIN >=8 RESISTANT Resistant GENTAMICIN <=0.5 SENSI... Sensitive Inducible Clindamycin NEGATIVE Sensitive OXACILLIN >=4 RESISTANT Resistant RIFAMPIN <=0.5 SENSI... Sensitive TETRACYCLINE <=1 SENSITIVE Sensitive TRIMETH/SULFA <=10 SENSIT Sensitive ... VANCOMYCIN 1 SENSITIVE Sensitive This is a patient we had in clinic earlier this year with a wound over his left fifth metatarsal head. 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 Doran Durand on 05/16/2019. He seems to have developed problems with wounds on his bilateral feet in June 2021 although he may have had problems earlier than this. He was in an urgent care with a right foot ulcer on 09/26/2019 and given a course of doxycycline. This was apparently after having trouble getting into see orthopedics. He was seen by podiatry on 09/28/2019 noted  to have bilateral lower extremity ulcers including the left lateral fifth metatarsal base and the right subfifth met head. It was noted that had purulent drainage at that time. He required hospitalization from 6/20 through 7/2. This was because of worsening right foot wounds. He underwent bilateral operative incision and drainage and bone biopsies bilaterally. Culture results are listed above. He has been referred back to clinic by Dr. Jacqualyn Posey of podiatry. He is also followed by Dr. Megan Salon who saw him yesterday. He was discharged from hospital on Zyvox Flagyl and Levaquin and yesterday changed to doxycycline Flagyl and Levaquin. His inflammatory markers on 6/26 showed a sedimentation rate of 129 and a C-reactive protein of 5. This is improved to 14 and 1.3 respectively. This would indicate improvement. ABIs in our clinic today were 1.23 on the right and 1.20 on the left 11/01/2019 on evaluation today patient appears to be doing fairly well in regard to the wounds on his feet at this point. Fortunately there is no signs of active infection at this time. No fevers, chills, nausea, vomiting, or diarrhea. He currently is seeing infectious disease and still under their care at this point. Subsequently he also has both wounds which she has not been using collagen on as he did not receive that in his packaging he did not call us and let us know that. Apparently that just was missed on the order. Nonetheless we will get that straightened out today. 8/9-Patient returns for bilateral foot wounds, using Prisma with hydrogel moistened dressings, and the wounds appear stable. Patient using surgical shoes, avoiding much pressure or weightbearing as much as possible 8/16; patient has bilateral foot wounds. 1 on the right lateral foot proximally the other is on the left mid lateral foot. Both required debridement of callus and thick skin around the wounds. We have been using silver collagen 8/27; patient has  bilateral lateral foot wounds. The area on the left substantially surrounded by callus and dry skin. This was removed from the wound edge. The underlying wound is small. The area on the right measured somewhat smaller today. 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 Unfortunately I did not update his antibiotics during today's visit. 9/10 I reviewed Dr. Hale Bogus last notes he felt he had completed antibiotics his inflammatory markers were reasonably well controlled. He has a small wound on the lateral left foot and a tiny area on the right which is just above closed. He is using Hydrofera Blue with border foam he has bilateral surgical shoes 9/24; 2 week f/u. doing well. right foot is closed. left foot still undermined. 10/14; right foot remains closed at the fifth met head. The area over the base of the left fifth metatarsal has a small open area but considerable undermining towards the plantar foot. Thick callus skin around this suggests  an adequate pressure relief. We have talked about this. He says he is going to go back into his cam boot. I suggested a total contact cast he did not seem enamored with this suggestion 10/26; left foot base of the fifth metatarsal. Same condition as last time. He has skin over the area with an open wound however the skin is not adherent. He went to see Dr. Earleen Newport who did an x-ray and culture of his foot I have not reviewed the x-ray but the patient was not told anything. He is on doxycycline 11/11; since the patient was last here he was in the emergency room on 10/30 he was concerned about swelling in the left foot. They did not do any cultures or x-rays. They changed his antibiotics to cephalexin. Previous culture showed group B strep. The cephalexin is appropriate as doxycycline has less than predictable coverage. Arrives in clinic today with swelling over this area under the wound. He also has a  new wound on the right fifth metatarsal head 11/18; the patient has a difficult wound on the lateral aspect of the left fifth metatarsal head. The wound was almost ballotable last week I opened it slightly expecting to see purulence however there was just bleeding. I cultured this this was negative. X-ray unchanged. We are trying to get an MRI but I am not sure were going to be able to get this through his insurance. He also has an area on the right lateral fifth metatarsal head this looks healthier 12/3; the patient finally got our MRI. Surprisingly this did not show osteomyelitis. I did show the soft tissue ulceration at the lateral plantar aspect of the fifth metatarsal base with a tiny residual 6 mm abscess overlying the superficial fascia I have tried to culture this area I have not been able to get this to grow anything. Nevertheless the protruding tissue looks aggravated. I suspect we should try to treat the underlying "abscess with broad-spectrum antibiotics. I am going to start him on Levaquin and Flagyl. He has much less edema in his legs and I am going to continue to wrap his legs and see him weekly 12/10. I started Levaquin and Flagyl on him last week. He just picked up the Flagyl apparently there was some delay. The worry is the wound on the left fifth metatarsal base which is substantial and worsening. His foot looks like he inverts at the ankle making this a weightbearing surface. Certainly no improvement in fact I think the measurements of this are somewhat worse. We have been using 12/17; he apparently just got the Levaquin yesterday this is 2 weeks after the fact. He has completed the Flagyl. The area over the left fifth metatarsal base still has protruding granulation tissue although it does not look quite as bad as it did some weeks ago. He has severe bilateral lymphedema although we have not been treating him for wounds on his legs this is definitely going to require compression.  There was so much edema in the left I did not wish to put him in a total contact cast today. I am going to increase his compression from 3-4 layer. The area on the right lateral fifth met head actually look quite good and superficial. 12/23; patient arrived with callus on the right fifth met head and the substantial hyper granulated callused wound on the base of his fifth metatarsal. He says he is completing his Levaquin in 2 days but I do not think that adds up with  what I gave him but I will have to double check this. We are using Hydrofera Blue on both areas. My plan is to put the left leg in a cast the week after New Year's 04/06/2020; patient's wounds about the same. Right lateral fifth metatarsal head and left lateral foot over the base of the fifth metatarsal. There is undermining on the left lateral foot which I removed before application of total contact cast continuing with Hydrofera Blue new. Patient tells me he was seen by endocrinology today lab work was done [Dr. Kerr]. Also wondering whether he was referred to cardiology. I went over some lab work from previously does not have chronic renal failure certainly not nephrotic range proteinuria he does have very poorly controlled diabetes but this is not his most updated lab work. Hemoglobin A1c has been over 11 1/10; the patient had a considerable amount of leakage towards mid part of his left foot with macerated skin however the wound surface looks better the area on the right lateral fifth met head is better as well. I am going to change the dressing on the left foot under the total contact cast to silver alginate, continue with Hydrofera Blue on the right. 1/20; patient was in the total contact cast for 10 days. Considerable amount of drainage although the skin around the wound does not look too bad on the left foot. The area on the right fifth metatarsal head is closed. Our nursing staff reports large amount of drainage out of the left  lateral foot wound 1/25; continues with copious amounts of drainage described by our intake staff. PCR culture I did last week showed E. coli and Enterococcus faecalis and low quantities. Multiple resistance genes documented including extended spectrum beta lactamase, MRSA, MRSE, quinolone, tetracycline. The wound is not quite as good this week as it was 5 days ago but about the same size 2/3; continues with copious amounts of malodorous drainage per our intake nurse. The PCR culture I did 2 weeks ago showed E. coli and low quantities of Enterococcus. There were multiple resistance genes detected. I put Neosporin on him last week although this does not seem to have helped. The wound is slightly deeper today. Offloading continues to be an issue here although with the amount of drainage she has a total contact cast is just not going to work 2/10; moderate amount of drainage. Patient reports he cannot get his stocking on over the dressing. I told him we have to do that the nurse gave him suggestions on how to make this work. The wound is on the bottom and lateral part of his left foot. Is cultured predominantly grew low amounts of Enterococcus, E. coli and anaerobes. There were multiple resistance genes detected including extended spectrum beta lactamase, quinolone, tetracycline. I could not think of an easy oral combination to address this so for now I am going to do topical antibiotics provided by [facility] I think the main agents here are vancomycin and an aminoglycoside. We have to be able to give him access to the wounds to get the topical antibiotic on 2/17; moderate amount of drainage this is unchanged. He has his Keystone topical antibiotic against the deep tissue culture organisms. He has been using this and changing the dressing daily. Silver alginate on the wound surface. 2/24; using Keystone antibiotic with silver alginate on the top. He had too much drainage for a total contact cast at one  point although I think that is improving and I think  in the next week or 2 it might be possible to replace a total contact cast I did not do this today. In general the wound surface looks healthy however he continues to have thick rims of skin and subcutaneous tissue around the wide area of the circumference which I debrided 06/04/2020 upon evaluation today patient appears to be doing well in regard to his wound. I do feel like he is showing signs of improvement. There is little bit of callus and dead tissue around the edges of the wound as well as what appears to be a little bit of a sinus tract that is off to the side laterally I would perform debridement to clear that away today. 3/17; left lateral foot. The wound looks about the same as I remember. Not much depth surface looks healthy. No evidence of infection 3/25; left lateral foot. Wound surface looks about the same. Separating epithelium from the circumference. There really is no evidence of infection here however not making progress by my view 3/29; left lateral foot. Surface of the wound again looks reasonably healthy still thick skin and subcutaneous tissue around the wound margins. There is no evidence of infection. One of the concerns being brought up by the nurses has again the amount of drainage vis--vis continued use of a total contact cast 4/5; left lateral foot at roughly the base of the fifth metatarsal. Nice healthy looking granulated tissue with rims of epithelialization. The overall wound measurements are not any better but the tissue looks healthy. The only concern is the amount of drainage although he has no surrounding maceration with what we have been doing recently to absorb fluid and protect his skin. He also has lymphedema. 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 He states that the kerlix/coban under the TCC rolled down and could not get it back up. He has been using an offloading scooter and has somehow hurt his right foot using this device. This happened last week. He states that the side of his right foot developed a blister and opened. The top of his foot also has a few small open wounds he thinks is due to his socks rubbing in his shoes. He has not been using any dressings to the wound. He denies purulent drainage, fever/chills or erythema to the wounds. 4/22; patient presents for 1 week follow-up. He developed new wounds to the right foot that were evaluated at last clinic visit. He continues to have a total contact cast to the left leg and he reports no issues. He has been using silver collagen to the right foot wounds with no issues. He denies purulent drainage, fever/chills or erythema to the right foot wounds. He has no complaints today 4/25; patient presents for 1 week follow-up. He has a total contact cast of the left leg and reports no issues. He has been using silver alginate to the right foot wound. He denies purulent drainage, fever/chills or erythema to the right foot wounds. 5/2 patient presents for 1 week follow-up. T contact cast on the left. The wound which is on the base of the plantar foot at the base of the fifth metatarsal otal actually looks quite good and dimensions continue to gradually contract. HOWEVER the area  on the right lateral fifth metatarsal head is much larger than what I remember from 2 weeks ago. Once more is he has significant levels of hypergranulation. Noteworthy that he had this same hyper granulated response on his wound on the left foot at one point in time. So much so that he I thought there was an underlying fluid collection. Based on this I think this just needs debridement. 5/9; the wound on the left actually continues to  be gradually smaller with a healthy surface. Slight amount of drainage and maceration of the skin around but not too bad. However he has a large wound over the right fifth metatarsal head very much in the same configuration as his left foot wound was initially. I used silver nitrate to address the hyper granulated tissue no mechanical debridement 5/16; area on the left foot did not look as healthy this week deeper thick surrounding macerated skin and subcutaneous tissue. The area on the right foot fifth met head was about the same The area on the right ankle that we identified last week is completely broken down into an open wound presumably a stocking rubbing issue 5/23; patient has been using a total contact cast to the left side. He has been using silver alginate underneath. He has also been using silver alginate to the right foot wounds. He has no complaints today. He denies any signs of infection. 5/31; the left-sided wound looks some better measure smaller surface granulation looks better. We have been using silver alginate under the total contact cast The large area on his right fifth met head and right dorsal foot look about the same still using silver alginate 6/6; neither side is good as I was hoping although the surface area dimensions are better. A lot of maceration on his left and right foot around the wound edge. Area on the dorsal right foot looks better. He says he was traveling. I am not sure what does the amount of maceration around the plantar wounds may be drainage issues 6/13; in general the wound surfaces look quite good on both sides. Macerated skin and raised edges around the wound required debridement although in general especially on the left the surface area seems improved. The area on the right dorsal ankle is about the same I thought this would not be such a problem to close 6/20; not much change in either wound although the one on the right looks a little better. Both  wounds have thick macerated edges to the skin requiring debridements. We have been using silver alginate. The area on the dorsal right ankle is still open I thought this would be closed. 6/28; patient comes in today with a marked deterioration in the right foot wound fifth met head. Wide area of exposed bone this is a drastic change from last time. The area on the left there we have been casting is stagnant. We have been using silver alginate in both wound areas. 7/5; bone culture I did for PCR last time was positive for Pseudomonas, group B strep, Enterococcus and Staph aureus. There was no suggestion of methicillin resistance or ampicillin resistant genes. This was resistant to tetracycline however He comes into the clinic today with the area over his right plantar fifth metatarsal head which had been doing so well 2 weeks ago completely necrotic feeling bone. I do not know that this is going to be salvageable. The left foot wound is certainly no smaller but it has a better surface and is superficial. 7/8;  patient called in this morning to say that his total contact cast was rubbing against his foot. He states he is doing fine overall. He denies signs of infection. 7/12; continued deterioration in the wound over the right fifth metatarsal head crumbling bone. This is not going to be salvageable. The patient agrees and wants to be referred to Dr. Doran Durand which we will attempt to arrange as soon as possible. I am going to continue him on antibiotics as long as that takes so I will renew those today. The area on the left foot which is the base of the fifth metatarsal continues to look somewhat better. Healthy looking tissue no depth no debridement is necessary here. 7/20; the patient was kindly seen by Dr. Doran Durand of orthopedics on 10/19/2020. 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 Towards this end we have  taken him out of the total contact cast on the left we will put him in a wrap with Hydrofera Blue. As I understand things surgery is planned for 7/21 7/27; patient had his surgery last Thursday. 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 Apparently everything went well we did not still disturb that today The area on the left foot actually looks quite good. He has been much less mobile which probably explains this he did not seem to do well in the total contact cast secondary to drainage and maceration I think. 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 Fortunately there is no evidence of active infection at this time. No fevers, chills, nausea, vomiting, or diarrhea. Overall I think that he is actually doing extremely well. Nonetheless I do believe that he is staying off of this more following the surgery in his right foot that is the reason the left is doing so great. 8/16; left plantar foot wound. This looks smaller than the last time I saw this he is using Hydrofera Blue. The surgical wound on the right foot is being followed by Dr. Doran Durand we did not look at this today. He has surgical shoes on both feet 8/23; left plantar foot wound not as good this week. Surrounding macerated skin and subcutaneous tissue everything looks moist and wet. I do not think he is offloading this adequately. He is using a surgical shoe Apparently the right foot surgical wound is not open although I did not check his foot 8/31; left plantar foot lateral aspect. Much improved this week. He has no maceration. Some improvement in the surface area of the wound but most impressively the depth is come in we are using silver alginate. The patient is a Product/process development scientist. He is asked that we write him a letter so he can go back to work. I have also tried to see if we can write something that will allow him to limit  the amount of time that he is on his foot at work. 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 Hopefully the latter can be avoided 9/6; I believe the patient missed an appointment last week. He arrives in today with a wound looking roughly the same certainly no better. Undermining laterally and also inferiorly. We used molecuLight today in training with the patient's permission.Marland Kitchen  We are using silver alginate 9/21 wound is measuring bigger this week although this may have to do with the aggressive circumferential debridement last week in response to the blush fluorescence on the MolecuLight. Culture I did last week showed significant MSSA and E. coli. I put him on Augmentin but he has not started it yet. We are also going to send this for compounded antibiotics at [facility]. There is no evidence of systemic infection 9/29; silver alginate. His Keystone arrived. He is completing Augmentin in 2 days. Offloading in a cam boot. Moderate drainage per our intake staff 10/5; using silver alginate. He has been using his Carlos. He has completed his Augmentin. Per our intake nurse still a lot of drainage, far too much to consider a total contact cast. Wound measures about the same. He had the same undermining area that I defined last week from a roughly 11-3. I remove this today Electronic Signature(s) Signed: 01/07/2021 5:45:05 PM By: Max Ham MD Entered By: [name] on 01/06/2021 08:18:17 -------------------------------------------------------------------------------- Physical Exam Details Patient Name: Date of Service: [name], [name] 01/06/2021 7:30 A M Medical Record Number: [id number] Patient Account Number: [id number] Date of Birth/Sex: Treating RN: [birth year] (35 y.o. [name] Primary Care Provider: Seward [name] Other Clinician: Referring Provider: Treating Provider/Extender: [name] in Treatment: 42 Constitutional Patient is hypertensive.. Pulse regular and within target range for patient.Marland Kitchen Respirations regular, non-labored and within target range.. Temperature is normal and within the target range for the patient.Marland Kitchen Appears in no distress. Cardiovascular Pedal pulses are palpable. Notes Wound exam; left plantar foot at the base of the fifth metatarsal. This week using pickups and a #15 scalpel I remove the undermining area so it could be properly dressed and also received its fair share of the topical Keystone antibiotic for surface biofilm. Hemostasis was with silver nitrate. The rest of the wound looks satisfactory but not epithelializing. He is wearing a cam boot. There is no evidence of surrounding infection Electronic Signature(s) Signed: 01/07/2021 5:45:05 PM By: Max Ham MD Entered By: [name] on 01/06/2021 08:20:21 -------------------------------------------------------------------------------- Physician Orders Details Patient Name: Date of Service: [name], [name] 01/06/2021 7:30 A M Medical Record Number: [id number] Patient Account Number: [id number] Date of Birth/Sex: Treating RN: 11/04/1985 (35 y.o. [name] Primary Care Provider: Seward [name] Other Clinician: Referring Provider: Treating Provider/Extender: [name] in Treatment: 25 Verbal / Phone Orders: No Diagnosis Coding ICD-10 Coding Code Description E11.621 Type 2 diabetes mellitus with foot ulcer L97.528 Non-pressure chronic ulcer of other part of left foot with other specified severity Follow-up Appointments ppointment in 1 week. - with Dr. Dellia Nims Return A Edema Control - Lymphedema / SCD / Other Bilateral Lower Extremities Elevate legs to the level of the heart or above for 30 minutes daily and/or when sitting, a frequency of: - throughout the day Avoid standing for long periods of time. Exercise  regularly Moisturize legs daily. - right leg every night before bed. Compression stocking or Garment 20-30 mm/Hg pressure to: - Apply to right leg in the morning and remove at night. Off-Loading DH Walker Boot to: - Left Foot Other: - minimal weight bearing left foot Additional Orders / Instructions Follow Nutritious Diet Wound Treatment Wound #3 - Foot Wound Laterality: Left, Lateral Cleanser: Soap and Water 1 x Per Day/30 Days Discharge Instructions: May shower and wash wound with dial antibacterial soap and water prior to dressing change. Cleanser: Wound Cleanser (DME) (Generic)  1 x Per Day/30 Days Discharge Instructions: Cleanse the wound with wound cleanser prior to applying a clean dressing using gauze sponges, not tissue or cotton balls. Peri-Wound Care: Zinc Oxide Ointment 30g tube 1 x Per Day/30 Days Discharge Instructions: Apply Zinc Oxide to periwound with each dressing change as needed. Topical: keystone antibiotic compound 1 x Per Day/30 Days Discharge Instructions: thin layer to wound bed Prim Dressing: KerraCel Ag Gelling Fiber Dressing, 4x5 in (silver alginate) (DME) (Generic) 1 x Per Day/30 Days ary Discharge Instructions: Apply silver alginate to wound bed as instructed Secondary Dressing: Woven Gauze Sponge, Non-Sterile 4x4 in (DME) (Generic) 1 x Per Day/30 Days Discharge Instructions: Apply over primary dressing as directed. Secondary Dressing: Zetuvit Plus 4x8 in (Generic) 1 x Per Day/30 Days Discharge Instructions: Apply over primary dressing as directed. Secondary Dressing: Optifoam Non-Adhesive Dressing, 4x4 in (DME) (Generic) 1 x Per Day/30 Days Discharge Instructions: Apply over primary dressing as directed. Secured With: Coban Self-Adherent Wrap 4x5 (in/yd) (DME) (Generic) 1 x Per Day/30 Days Discharge Instructions: Secure with Coban as directed. Secured With: [employer], 4.5x3.1 (in/yd) (DME) (Generic) 1 x Per Day/30 Days Discharge Instructions:  Secure with Kerlix as directed. Secured With: 7M Medipore H Soft Cloth Surgical T 4 x 2 (in/yd) (DME) (Generic) 1 x Per Day/30 Days ape Discharge Instructions: Secure dressing with tape as directed. Electronic Signature(s) Signed: 01/07/2021 5:45:05 PM By: Max Ham MD Signed: 01/07/2021 6:21:03 PM By: Max Hurst RN, BSN Entered By: [name] on 01/06/2021 11:56:06 -------------------------------------------------------------------------------- Problem List Details Patient Name: Date of Service: [name], [name] 01/06/2021 7:30 A M Medical Record Number: [id number] Patient Account Number: [id number] Date of Birth/Sex: Treating RN: [birth year] (35 y.o. [name] Primary Care Provider: Seward [name] Other Clinician: Referring Provider: Treating Provider/Extender: [name] in Treatment: 62 Active Problems ICD-10 Encounter Code Description Active Date MDM Diagnosis E11.621 Type 2 diabetes mellitus with foot ulcer 10/24/2019 No Yes L97.528 Non-pressure chronic ulcer of other part of left foot with other specified 10/24/2019 No Yes severity Inactive Problems ICD-10 Code Description Active Date Inactive Date L97.518 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ity 10/24/2019 10/24/2019 L97.518 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ity 07/14/2020 07/14/2020 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, right ankle and foot 10/24/2019 10/24/2019 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 08/10/2020 08/10/2020 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o. 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 He comes in for review of a wound over the plantar fifth metatarsal head extending into the lateral part of the foot. He was followed for this previously by his podiatrist Dr. Cornelius [name]. As the patient tells his story he went to see podiatry first for a swelling he developed on the lateral part of his fifth  metatarsal head in May. He states this was "open" by podiatry and the area closed. He was followed up in June and it was again opened callus removed and it closed promptly. There were plans being made for surgery on the fifth metatarsal head in June however his blood sugar was apparently too high for anesthesia. Apparently the area was debrided and opened again in June and it is never closed since. Looking over the records from podiatry I am really not able to follow this. It was clear when he was first seen it was before 5/14 at that point he already had a wound. By 5/17 the ulcer was resolved. I do not see anything about a procedure. On 5/28 noted to have pre-ulcerative moderate keratosis. X-ray noted 1/5 contracted toe and tailor's bunion and metatarsal deformity. On a visit date on 09/28/2018 the dorsal part of the left foot it healed and resolved. There was concern about swelling in his lower extremity he was sent to the ER.. As far as I can tell he was seen in  the ER on 7/12 with an ulcer on his left foot. A DVT rule out of the left leg was negative. I do not think I have complete records from podiatry but I am not able to verify the procedures this patient states he had. He states after the last procedure the wound has never closed although I am not able to follow this in the records I have from podiatry. He has not had a recent x-ray The patient has been using Neosporin on the wound. He is wearing a Darco shoe. He is still very active up on his foot working and exercising. Past medical history; type 2 diabetes ketosis-prone, leg swelling with a negative DVT study in July. Non-smoker ABI in our clinic was 0.85 on the left 10/16; substantial wound on the plantar left fifth met head extending laterally almost to the dorsal fifth MTP. We have been using silver alginate we gave him a Darco forefoot off loader. An x-ray did not show evidence of osteomyelitis did note soft tissue emphysema which I think was due to gas tracking through an open wound. There is no doubt in my mind he requires an MRI 10/23; MRI not booked until 3 November at the earliest this is largely due to his glucose sensor in the right arm. We have been using silver alginate. There has been an improvement 10/29; I am still not exactly sure when his MRI is booked for. He says it is the third but it is the 10th in epic. This definitely needs to be done. He is running a low-grade fever today but no other symptoms. No real improvement in the 1 02/26/2019 patient presents today for a follow-up visit here in our clinic he is last been seen in the clinic on October 29. Subsequently we were working on getting MRI to evaluate and see what exactly was going on and where we would need to go from the standpoint of whether or not he had osteomyelitis and again what treatments were going be required. Subsequently the patient ended up being admitted to the hospital on 02/07/2019 and was discharged  on 02/14/2019. This is a somewhat interesting admission with a discharge diagnosis of pneumonia due to COVID-19 although he was positive for COVID-19 when tested at the urgent care but negative x2 when he was actually in the hospital. With that being said he did have acute respiratory failure with hypoxia and it was noted he also  have a left foot ulceration with osteomyelitis. With that being said he did require oxygen for his pneumonia and I level 4 L. He was placed on antivirals and steroids for the COVID-19. He was also transferred to the [facility] at one point. Nonetheless he did subsequently discharged home and since being home has done much better in that regard. The CT angiogram did not show any pulmonary embolism. With regard to the osteomyelitis the patient was placed on vancomycin and Zosyn while in the hospital but has been changed to Augmentin at discharge. It was also recommended that he follow- up with wound care and podiatry. Podiatry however wanted him to see [name] according to the patient prior to them doing anything further. His hemoglobin A1c was 9.9 as noted in the hospital. Have an MRI of the left foot performed while in the hospital on 02/04/2019. 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 There is an overlying plantar open wound noted an abscess tracking back along the lateral aspect of the fifth metatarsal shaft. There is otherwise diffuse cellulitis and mild fasciitis without findings of polymyositis. The patient did have recently pneumonia secondary to COVID-19 I looked in the chart through epic and it does appear that the patient may need to have an additional x-ray just to ensure everything is cleared and that he has no airspace disease prior to putting him into the [name]. 03/05/2019; patient was readmitted to the clinic last week. He was hospitalized twice for a viral  upper respiratory tract infection from 11/1 through 11/4 and then 11/5 through 11/12 ultimately this turned out to be Covid pneumonitis. Although he was discharged on oxygen he is not using it. He says he feels fine. He has no exercise limitation no cough no sputum. His O2 sat in our clinic today was 100% on room air. 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 He received Vanco and Zosyn in the hospital and then was discharged on 2 weeks of Augmentin. I do not see any relevant cultures. He was supposed to follow-up with infectious disease but I do not see that he has an appointment. 12/8; patient saw Dr. Novella Olive of infectious disease last week. He felt that he had had adequate antibiotic therapy. He did not go to follow-up with Dr. Amalia Hailey of podiatry and I have again talked to him about the pros and cons of this. He does not want to consider a ray amputation of this time. He is aware of the risks of recurrence, migration etc. He started HBO today and tolerated this well. He can complete the Augmentin that I gave him last week. I have looked over the lab work that Dr. Chana Bode ordered his C-reactive protein was 3.3 and his sedimentation rate was 17. The C-reactive protein is never really been measurably that high in this patient 12/15; not much change in the wound today however he has undermining along the lateral part of the foot again more extensively than last week. He has some rims of epithelialization. We have been using silver alginate. He is undergoing hyperbarics but did not dive today 12/18; in for his obligatory first total contact cast change. Unfortunately there was pus coming from the undermining area around his fifth metatarsal head. This was cultured but will preclude reapplication of a cast. He is seen  in conjunction with HBO 12/24; patient had staph lugdunensis in the wound  in the undermining area laterally last time. We put him on doxycycline which should have covered this. The wound looks better today. I am going to give him another week of doxycycline before reattempting the total contact cast 12/31; the patient is completing antibiotics. Hemorrhagic debris in the distal part of the wound with some undermining distally. He also had hyper granulation. Extensive debridement with a #5 curette. The infected area that was on the lateral part of the fifth met head is closed over. I do not think he needs any more antibiotics. Patient was seen prior to HBO. Preparations for a total contact cast were made in the cast will be placed post hyperbarics 04/11/19; once again the patient arrives today without complaint. He had been in a cast all week noted that he had heavy drainage this week. This resulted in large raised areas of macerated tissue around the wound 1/14; wound bed looks better slightly smaller. Hydrofera Blue has been changing himself. He had a heavy drainage last week which caused a lot of maceration around the wound so I took him out of a total contact cast he says the drainage is actually better this week He is seen today in conjunction with HBO 1/21; returns to clinic. He was up in [place] for a day or 2 attending a funeral. He comes back in with the wound larger and with a large area of exposed bone. He had osteomyelitis and septic arthritis of the fifth left metatarsal head while he was in hospital. He received IV antibiotics in the hospital for a prolonged period of time then 3 weeks of Augmentin. Subsequently I gave him 2 weeks of doxycycline for more superficial wound infection. When I saw this last week the wound was smaller the surface of the wound looks satisfactory. 1/28; patient missed hyperbarics today. Bone biopsy I did last time showed Enterococcus faecalis and Staphylococcus lugdunensis . He has a wide area of exposed bone. We are going to use silver  alginate as of today. I had another ethical discussion with the patient. 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 In this situation I think the likelihood of healing this is low. Therefore I have recommended a ray amputation and with the patient's agreement I have referred him to Dr. Doran Durand. The other issue is that his compliance with hyperbarics has been minimal because of his work schedule and given his underlying decision I am going to stop this today READMISSION 10/24/2019 MRI 09/29/2019 left foot IMPRESSION: 1. Apparent skin ulceration inferior and lateral to the 5th metatarsal base with underlying heterogeneous T2 signal and enhancement in the subcutaneous fat. Small peripherally enhancing fluid collections along the plantar and lateral aspects of the 5th metatarsal base suspicious for abscesses. 2. Interval amputation through the mid 5th metatarsal with nonspecific low-level marrow edema and enhancement. Given the proximity to the adjacent soft tissue inflammatory changes, osteomyelitis cannot be excluded. 3. The additional bones appear unremarkable. MRI 09/29/2019 right foot IMPRESSION: 1. Soft tissue ulceration lateral to the 5th MTP joint. There is low-level T2 hyperintensity within the 4th and 5th metatarsal heads and adjacent proximal phalanges without abnormal T1 signal or cortical destruction. These findings are nonspecific and could be seen with early marrow edema, hyperemia or early osteomyelitis. No evidence of septic joint. 2. Mild tenosynovitis and synovial enhancement associated with the extensor digitorum tendons at the level of the midfoot. 3. Diffuse low-level  muscular T2 hyperintensity and enhancement, most consistent with diabetic myopathy. LEFT FOOT BONE Methicillin resistant staphylococcus aureus Staphylococcus lugdunensis MIC MIC CIPROFLOXACIN >=8 RESISTANT Resistant <=0.5 SENSI... Sensitive CLINDAMYCIN <=0.25 SENS...  Sensitive >=8 RESISTANT Resistant ERYTHROMYCIN >=8 RESISTANT Resistant >=8 RESISTANT Resistant GENTAMICIN <=0.5 SENSI... Sensitive <=0.5 SENSI... Sensitive Inducible Clindamycin NEGATIVE Sensitive NEGATIVE Sensitive OXACILLIN >=4 RESISTANT Resistant 2 SENSITIVE Sensitive RIFAMPIN <=0.5 SENSI... Sensitive <=0.5 SENSI... Sensitive TETRACYCLINE <=1 SENSITIVE Sensitive <=1 SENSITIVE Sensitive TRIMETH/SULFA <=10 SENSIT Sensitive <=10 SENSIT Sensitive ... Marland Kitchen.. VANCOMYCIN 1 SENSITIVE Sensitive <=0.5 SENSI... Sensitive Right foot bone . Component 3 wk ago Specimen Description BONE Special Requests RIGHT 4 METATARSAL SAMPLE B Gram Stain NO WBC SEEN NO ORGANISMS SEEN Culture RARE METHICILLIN RESISTANT STAPHYLOCOCCUS AUREUS NO ANAEROBES ISOLATED Performed at [facility], [place] [address], [place],  [address] Report Status 10/08/2019 FINAL Organism ID, Bacteria METHICILLIN RESISTANT STAPHYLOCOCCUS AUREUS Resulting Agency [facility] Susceptibility Methicillin resistant staphylococcus aureus MIC CIPROFLOXACIN >=8 RESISTANT Resistant CLINDAMYCIN <=0.25 SENS... Sensitive ERYTHROMYCIN >=8 RESISTANT Resistant GENTAMICIN <=0.5 SENSI... Sensitive Inducible Clindamycin NEGATIVE Sensitive OXACILLIN >=4 RESISTANT Resistant RIFAMPIN <=0.5 SENSI... Sensitive TETRACYCLINE <=1 SENSITIVE Sensitive TRIMETH/SULFA <=10 SENSIT Sensitive ... VANCOMYCIN 1 SENSITIVE Sensitive This is a patient we had in clinic earlier this year with a wound over his left fifth metatarsal head. 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 Doran Durand on 05/16/2019. He seems to have developed problems with wounds on his bilateral feet in June 2021  although he may have had problems earlier than this. He was in an urgent care with a right foot ulcer on 09/26/2019 and given a course of doxycycline. This was apparently after having trouble getting into see orthopedics. He was seen by podiatry on 09/28/2019 noted to have bilateral lower extremity ulcers including the left lateral fifth metatarsal base and the right subfifth met head. It was noted that had purulent drainage at that time. He required hospitalization from 6/20 through 7/2. This was because of worsening right foot wounds. He underwent bilateral operative incision and drainage and bone biopsies bilaterally. Culture results are listed above. He has been referred back to clinic by Dr. Jacqualyn Posey of podiatry. He is also followed by Dr. Megan Salon who saw him yesterday. He was discharged from hospital on Zyvox Flagyl and Levaquin and yesterday changed to doxycycline Flagyl and Levaquin. His inflammatory markers on 6/26 showed a sedimentation rate of 129 and a C-reactive protein of 5. This is improved to 14 and 1.3 respectively. This would indicate improvement. ABIs in our clinic today were 1.23 on the right and 1.20 on the left 11/01/2019 on evaluation today patient appears to be doing fairly well in regard to the wounds on his feet at this point. Fortunately there is no signs of active infection at this time. No fevers, chills, nausea, vomiting, or diarrhea. He currently is seeing infectious disease and still under their care at this point. Subsequently he also has both wounds which she has not been using collagen on as he did not receive that in his packaging he did not call us and let us know that. Apparently that just was missed on the order. Nonetheless we will get that straightened out today. 8/9-Patient returns for bilateral foot wounds, using Prisma with hydrogel moistened dressings,  and the wounds appear stable. Patient using surgical shoes, avoiding much pressure or weightbearing as much as  possible 8/16; patient has bilateral foot wounds. 1 on the right lateral foot proximally the other is on the left mid lateral foot. Both required debridement of callus and thick skin around the wounds. We have been using silver collagen 8/27; patient has bilateral lateral foot wounds. The area on the left substantially surrounded by callus and dry skin. This was removed from the wound edge. The underlying wound is small. The area on the right measured somewhat smaller today. 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 Unfortunately I did not update his antibiotics during today's visit. 9/10 I reviewed Dr. Hale Bogus last notes he felt he had completed antibiotics his inflammatory markers were reasonably well controlled. He has a small wound on the lateral left foot and a tiny area on the right which is just above closed. He is using Hydrofera Blue with border foam he has bilateral surgical shoes 9/24; 2 week f/u. doing well. right foot is closed. left foot still undermined. 10/14; right foot remains closed at the fifth met head. The area over the base of the left fifth metatarsal has a small open area but considerable undermining towards the plantar foot. Thick callus skin around this suggests an adequate pressure relief. We have talked about this. He says he is going to go back into his cam boot. I suggested a total contact cast he did not seem enamored with this suggestion 10/26; left foot base of the fifth metatarsal. Same condition as last time. He has skin over the area with an open wound however the skin is not adherent. He went to see Dr. Earleen Newport who did an x-ray and culture of his foot I have not reviewed the x-ray but the patient was not told anything. He is on doxycycline 11/11; since the patient was last here he was in the emergency room on 10/30 he was concerned about swelling in the left foot. They did not do any cultures  or x-rays. They changed his antibiotics to cephalexin. Previous culture showed group B strep. The cephalexin is appropriate as doxycycline has less than predictable coverage. Arrives in clinic today with swelling over this area under the wound. He also has a new wound on the right fifth metatarsal head 11/18; the patient has a difficult wound on the lateral aspect of the left fifth metatarsal head. The wound was almost ballotable last week I opened it slightly expecting to see purulence however there was just bleeding. I cultured this this was negative. X-ray unchanged. We are trying to get an MRI but I am not sure were going to be able to get this through his insurance. He also has an area on the right lateral fifth metatarsal head this looks healthier 12/3; the patient finally got our MRI. Surprisingly this did not show osteomyelitis. I did show the soft tissue ulceration at the lateral plantar aspect of the fifth metatarsal base with a tiny residual 6 mm abscess overlying the superficial fascia I have tried to culture this area I have not been able to get this to grow anything. Nevertheless the protruding tissue looks aggravated. I suspect we should try to treat the underlying "abscess with broad-spectrum antibiotics. I am going to start him on Levaquin and Flagyl. He has much less edema in his legs and I am going to continue to wrap his legs and see him  weekly 12/10. I started Levaquin and Flagyl on him last week. He just picked up the Flagyl apparently there was some delay. The worry is the wound on the left fifth metatarsal base which is substantial and worsening. His foot looks like he inverts at the ankle making this a weightbearing surface. Certainly no improvement in fact I think the measurements of this are somewhat worse. We have been using 12/17; he apparently just got the Levaquin yesterday this is 2 weeks after the fact. He has completed the Flagyl. The area over the left fifth  metatarsal base still has protruding granulation tissue although it does not look quite as bad as it did some weeks ago. He has severe bilateral lymphedema although we have not been treating him for wounds on his legs this is definitely going to require compression. There was so much edema in the left I did not wish to put him in a total contact cast today. I am going to increase his compression from 3-4 layer. The area on the right lateral fifth met head actually look quite good and superficial. 12/23; patient arrived with callus on the right fifth met head and the substantial hyper granulated callused wound on the base of his fifth metatarsal. He says he is completing his Levaquin in 2 days but I do not think that adds up with what I gave him but I will have to double check this. We are using Hydrofera Blue on both areas. My plan is to put the left leg in a cast the week after New Year's 04/06/2020; patient's wounds about the same. Right lateral fifth metatarsal head and left lateral foot over the base of the fifth metatarsal. There is undermining on the left lateral foot which I removed before application of total contact cast continuing with Hydrofera Blue new. Patient tells me he was seen by endocrinology today lab work was done [Dr. Kerr]. Also wondering whether he was referred to cardiology. I went over some lab work from previously does not have chronic renal failure certainly not nephrotic range proteinuria he does have very poorly controlled diabetes but this is not his most updated lab work. Hemoglobin A1c has been over 11 1/10; the patient had a considerable amount of leakage towards mid part of his left foot with macerated skin however the wound surface looks better the area on the right lateral fifth met head is better as well. I am going to change the dressing on the left foot under the total contact cast to silver alginate, continue with Hydrofera Blue on the right. 1/20; patient was in  the total contact cast for 10 days. Considerable amount of drainage although the skin around the wound does not look too bad on the left foot. The area on the right fifth metatarsal head is closed. Our nursing staff reports large amount of drainage out of the left lateral foot wound 1/25; continues with copious amounts of drainage described by our intake staff. PCR culture I did last week showed E. coli and Enterococcus faecalis and low quantities. Multiple resistance genes documented including extended spectrum beta lactamase, MRSA, MRSE, quinolone, tetracycline. The wound is not quite as good this week as it was 5 days ago but about the same size 2/3; continues with copious amounts of malodorous drainage per our intake nurse. The PCR culture I did 2 weeks ago showed E. coli and low quantities of Enterococcus. There were multiple resistance genes detected. I put Neosporin on him last week although this does  not seem to have helped. The wound is slightly deeper today. Offloading continues to be an issue here although with the amount of drainage she has a total contact cast is just not going to work 2/10; moderate amount of drainage. Patient reports he cannot get his stocking on over the dressing. I told him we have to do that the nurse gave him suggestions on how to make this work. The wound is on the bottom and lateral part of his left foot. Is cultured predominantly grew low amounts of Enterococcus, E. coli and anaerobes. There were multiple resistance genes detected including extended spectrum beta lactamase, quinolone, tetracycline. I could not think of an easy oral combination to address this so for now I am going to do topical antibiotics provided by [facility] I think the main agents here are vancomycin and an aminoglycoside. We have to be able to give him access to the wounds to get the topical antibiotic on 2/17; moderate amount of drainage this is unchanged. He has his Keystone topical  antibiotic against the deep tissue culture organisms. He has been using this and changing the dressing daily. Silver alginate on the wound surface. 2/24; using Keystone antibiotic with silver alginate on the top. He had too much drainage for a total contact cast at one point although I think that is improving and I think in the next week or 2 it might be possible to replace a total contact cast I did not do this today. In general the wound surface looks healthy however he continues to have thick rims of skin and subcutaneous tissue around the wide area of the circumference which I debrided 06/04/2020 upon evaluation today patient appears to be doing well in regard to his wound. I do feel like he is showing signs of improvement. There is little bit of callus and dead tissue around the edges of the wound as well as what appears to be a little bit of a sinus tract that is off to the side laterally I would perform debridement to clear that away today. 3/17; left lateral foot. The wound looks about the same as I remember. Not much depth surface looks healthy. No evidence of infection 3/25; left lateral foot. Wound surface looks about the same. Separating epithelium from the circumference. There really is no evidence of infection here however not making progress by my view 3/29; left lateral foot. Surface of the wound again looks reasonably healthy still thick skin and subcutaneous tissue around the wound margins. There is no evidence of infection. One of the concerns being brought up by the nurses has again the amount of drainage vis--vis continued use of a total contact cast 4/5; left lateral foot at roughly the base of the fifth metatarsal. Nice healthy looking granulated tissue with rims of epithelialization. The overall wound measurements are not any better but the tissue looks healthy. The only concern is the amount of drainage although he has no surrounding maceration with what we have been doing  recently to absorb fluid and protect his skin. He also has lymphedema. 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 He states that the kerlix/coban under the TCC rolled down and could not get it back up. He has been using an  offloading scooter and has somehow hurt his right foot using this device. This happened last week. He states that the side of his right foot developed a blister and opened. The top of his foot also has a few small open wounds he thinks is due to his socks rubbing in his shoes. He has not been using any dressings to the wound. He denies purulent drainage, fever/chills or erythema to the wounds. 4/22; patient presents for 1 week follow-up. He developed new wounds to the right foot that were evaluated at last clinic visit. He continues to have a total contact cast to the left leg and he reports no issues. He has been using silver collagen to the right foot wounds with no issues. He denies purulent drainage, fever/chills or erythema to the right foot wounds. He has no complaints today 4/25; patient presents for 1 week follow-up. He has a total contact cast of the left leg and reports no issues. He has been using silver alginate to the right foot wound. He denies purulent drainage, fever/chills or erythema to the right foot wounds. 5/2 patient presents for 1 week follow-up. T contact cast on the left. The wound which is on the base of the plantar foot at the base of the fifth metatarsal otal actually looks quite good and dimensions continue to gradually contract. HOWEVER the area on the right lateral fifth metatarsal head is much larger than what I remember from 2 weeks ago. Once more is he has significant levels of hypergranulation.  Noteworthy that he had this same hyper granulated response on his wound on the left foot at one point in time. So much so that he I thought there was an underlying fluid collection. Based on this I think this just needs debridement. 5/9; the wound on the left actually continues to be gradually smaller with a healthy surface. Slight amount of drainage and maceration of the skin around but not too bad. However he has a large wound over the right fifth metatarsal head very much in the same configuration as his left foot wound was initially. I used silver nitrate to address the hyper granulated tissue no mechanical debridement 5/16; area on the left foot did not look as healthy this week deeper thick surrounding macerated skin and subcutaneous tissue. oo The area on the right foot fifth met head was about the same oo The area on the right ankle that we identified last week is completely broken down into an open wound presumably a stocking rubbing issue 5/23; patient has been using a total contact cast to the left side. He has been using silver alginate underneath. He has also been using silver alginate to the right foot wounds. He has no complaints today. He denies any signs of infection. 5/31; the left-sided wound looks some better measure smaller surface granulation looks better. We have been using silver alginate under the total contact cast oo The large area on his right fifth met head and right dorsal foot look about the same still using silver alginate 6/6; neither side is good as I was hoping although the surface area dimensions are better. A lot of maceration on his left and right foot around the wound edge. Area on the dorsal right foot looks better. He says he was traveling. I am not sure what does the amount of maceration around the plantar wounds may be drainage issues 6/13; in general the wound surfaces look quite good on both sides. Macerated skin and raised edges around the  wound  required debridement although in general especially on the left the surface area seems improved. oo The area on the right dorsal ankle is about the same I thought this would not be such a problem to close 6/20; not much change in either wound although the one on the right looks a little better. Both wounds have thick macerated edges to the skin requiring debridements. We have been using silver alginate. The area on the dorsal right ankle is still open I thought this would be closed. 6/28; patient comes in today with a marked deterioration in the right foot wound fifth met head. Wide area of exposed bone this is a drastic change from last time. The area on the left there we have been casting is stagnant. We have been using silver alginate in both wound areas. 7/5; bone culture I did for PCR last time was positive for Pseudomonas, group B strep, Enterococcus and Staph aureus. There was no suggestion of methicillin resistance or ampicillin resistant genes. This was resistant to tetracycline however He comes into the clinic today with the area over his right plantar fifth metatarsal head which had been doing so well 2 weeks ago completely necrotic feeling bone. I do not know that this is going to be salvageable. The left foot wound is certainly no smaller but it has a better surface and is superficial. 7/8; patient called in this morning to say that his total contact cast was rubbing against his foot. He states he is doing fine overall. He denies signs of infection. 7/12; continued deterioration in the wound over the right fifth metatarsal head crumbling bone. This is not going to be salvageable. The patient agrees and wants to be referred to Dr. Doran Durand which we will attempt to arrange as soon as possible. I am going to continue him on antibiotics as long as that takes so I will renew those today. The area on the left foot which is the base of the fifth metatarsal continues to look somewhat better.  Healthy looking tissue no depth no debridement is necessary here. 7/20; the patient was kindly seen by Dr. Doran Durand of orthopedics on 10/19/2020. 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 Towards this end we have taken him out of the total contact cast on the left we will put him in a wrap with Hydrofera Blue. As I understand things surgery is planned for 7/21 7/27; patient had his surgery last Thursday. 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 Apparently everything went well we did not still disturb that today The area on the left foot actually looks quite good. He has been much less mobile which probably explains this he did not seem to do well in the total contact cast secondary to drainage and maceration I think. 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 Fortunately there is no evidence of active infection at this time. No fevers, chills, nausea, vomiting, or diarrhea. Overall I think that he is actually doing extremely well. Nonetheless I do believe that he is staying off of this more following the surgery in his right foot that is the reason the left is doing so great. 8/16; left plantar foot wound. This looks smaller than the last time I saw this he is using Hydrofera Blue.  The surgical wound on the right foot is being followed by Dr. Doran Durand we did not look at this today. He has surgical shoes on both feet 8/23; left plantar foot wound not as good this week. Surrounding macerated skin and subcutaneous tissue everything looks moist and wet. I do not think he is offloading this adequately. He is using a surgical shoe Apparently the right foot surgical wound is not open although I did not check his foot 8/31; left plantar foot lateral aspect. Much improved this week. He has no maceration. Some  improvement in the surface area of the wound but most impressively the depth is come in we are using silver alginate. The patient is a Product/process development scientist. He is asked that we write him a letter so he can go back to work. I have also tried to see if we can write something that will allow him to limit the amount of time that he is on his foot at work. 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 Hopefully the latter can be avoided 9/6; I believe the patient missed an appointment last week. He arrives in today with a wound looking roughly the same certainly no better. Undermining laterally and also inferiorly. We used molecuLight today in training with the patient's permission.. We are using silver alginate 9/21 wound is measuring bigger this week although this may have to do with the aggressive circumferential debridement last week in response to the blush fluorescence on the MolecuLight. Culture I did last week showed significant MSSA and E. coli. I put him on Augmentin but he has not started it yet. We are also going to send this for compounded antibiotics at [facility]. There is no evidence of systemic infection 9/29; silver alginate. His Keystone arrived. He is completing Augmentin in 2 days. Offloading in a cam boot. Moderate drainage per our intake staff 10/5; using silver alginate. He has been using his Ellerbe. He has completed his Augmentin. Per our intake nurse still a lot of drainage, far too much to consider a total contact cast. Wound measures about the same. He had the same undermining area that I defined last week from a roughly 11-3. I remove this today Objective Constitutional Patient is hypertensive.. Pulse regular and within target range for patient.Marland Kitchen Respirations regular, non-labored and within target range.. Temperature is normal and within the target range for the patient.Marland Kitchen Appears in no  distress. Vitals Time Taken: 7:52 AM, Height: 77 in, Weight: 280 lbs, BMI: 33.2, Temperature: 98.7 F, Pulse: 91 bpm, Respiratory Rate: 17 breaths/min, Blood Pressure: 146/94 mmHg, Capillary Blood Glucose: 127 mg/dl. Cardiovascular Pedal pulses are palpable. General Notes: Wound exam; left plantar foot at the base of the fifth metatarsal. This week using pickups and a #15 scalpel I remove the undermining area so it could be properly dressed and also received its fair share of the topical Keystone antibiotic for surface biofilm. Hemostasis was with silver nitrate. The rest of the wound looks satisfactory but not epithelializing. He is wearing a cam boot. There is no evidence of surrounding infection Integumentary (Hair, Skin) Wound #3 status is Open. Original cause of wound was Trauma. The date acquired was: 10/02/2019. The wound has been in treatment 62 weeks. The wound is located on the Left,Lateral Foot. The wound measures 4cm length x 3.7cm width x 0.4cm depth; 11.624cm^2 area and 4.65cm^3 volume. There is Fat Layer (Subcutaneous Tissue) exposed. Tunneling  has been noted at 9:00 with a maximum distance of 2.6cm. Undermining begins at 12:00 and ends at 8:00 with a maximum distance of 1cm. There is a medium amount of serosanguineous drainage noted. The wound margin is thickened. There is large (67-100%) red, pink granulation within the wound bed. There is a small (1-33%) amount of necrotic tissue within the wound bed. 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 There was a Excisional Skin/Subcutaneous Tissue Debridement with a total area of 3 sq cm performed by [name]., MD.  With the following instrument(s): Blade, and Forceps to remove Non-Viable tissue/material. Material removed includes Callus, Subcutaneous Tissue, and Skin: Dermis. No specimens were taken. A time out was conducted at 08:10, prior to the start of the procedure. A Moderate amount of bleeding was controlled with Pressure. The procedure was tolerated well with a pain level of 0 throughout and a pain level of 0 following the procedure. Post Debridement Measurements: 4cm length x 3.7cm width x 0.4cm depth; 4.65cm^3 volume. Character of Wound/Ulcer Post Debridement is stable. Severity of Tissue Post Debridement is: Fat layer exposed. Post procedure Diagnosis Wound #3: Same as Pre-Procedure Plan Follow-up Appointments: Return Appointment in 1 week. - with Dr. Dellia Nims Edema Control - Lymphedema / SCD / Other: Elevate legs to the level of the heart or above for 30 minutes daily and/or when sitting, a frequency of: - throughout the day Avoid standing for long periods of time. Exercise regularly Moisturize legs daily. - right leg every night before bed. Compression stocking or Garment 20-30 mm/Hg pressure to: - Apply to right leg in the morning and remove at night. Off-Loading: DH Walker Boot to: - Left Foot Other: - minimal weight bearing left foot Additional Orders / Instructions: Follow Nutritious Diet WOUND #3: - Foot Wound Laterality: Left, Lateral Cleanser: Soap and Water 1 x Per Day/30 Days Discharge Instructions: May shower and wash wound with dial antibacterial soap and water prior to dressing change. Cleanser: Wound Cleanser (Generic) 1 x Per Day/30 Days Discharge Instructions: Cleanse the wound with wound cleanser prior to applying a clean dressing using gauze sponges, not tissue or cotton balls. Peri-Wound Care: Zinc Oxide Ointment 30g tube 1 x Per Day/30 Days Discharge Instructions: Apply Zinc Oxide to periwound with each dressing change as needed. Topical: keystone antibiotic compound 1  x Per Day/30 Days Discharge Instructions: thin layer to wound bed Prim Dressing: KerraCel Ag Gelling Fiber Dressing, 4x5 in (silver alginate) (Generic) 1 x Per Day/30 Days ary Discharge Instructions: Apply silver alginate to wound bed as instructed Secondary Dressing: Woven Gauze Sponge, Non-Sterile 4x4 in (Generic) 1 x Per Day/30 Days Discharge Instructions: Apply over primary dressing as directed. Secondary Dressing: Zetuvit Plus 4x8 in (Generic) 1 x Per Day/30 Days Discharge Instructions: Apply over primary dressing as directed. Secondary Dressing: Optifoam Non-Adhesive Dressing, 4x4 in (Generic) 1 x Per Day/30 Days Discharge Instructions: Apply over primary dressing as directed. Secured With: Coban Self-Adherent Wrap 4x5 (in/yd) (Generic) 1 x Per Day/30 Days Discharge Instructions: Secure with Coban as directed. Secured With: [employer], 4.5x3.1 (in/yd) (Generic) 1 x Per Day/30 Days Discharge Instructions: Secure with Kerlix as directed. Secured With: 35M Medipore H Soft Cloth Surgical T 4 x 2 (in/yd) (  Generic) 1 x Per Day/30 Days ape Discharge Instructions: Secure dressing with tape as directed. #1 the patient has completed his Augmentin and he is using his Keystone antibiotics under silver alginate #2 he is still having far too much drainage to consider a total contact cast. I believe he is one of the patients we tried to put a wound VAC on under a total contact cast but this is never really worked in Oncologist. 3. Could consider polymen Ag if were not making any progress. 4. Perhaps another look with MolecuLight next week. 5. The patient is an active man. I am not sure that the cam boot actually is offloading this sufficiently however right now I think that is the best we can do with this wound in this location Electronic Signature(s) Signed: 01/07/2021 5:45:05 PM By: Max Ham MD Entered By: [name] on 01/06/2021  08:23:57 -------------------------------------------------------------------------------- SuperBill Details Patient Name: Date of Service: [name], [name] 01/06/2021 Medical Record Number: [id number] Patient Account Number: [id number] Date of Birth/Sex: Treating RN: June 05, 1985 (35 y.o. [name] Primary Care Provider: Seward [name] Other Clinician: Referring Provider: Treating Provider/Extender: [name] in Treatment: 62 Diagnosis Coding ICD-10 Codes Code Description E11.621 Type 2 diabetes mellitus with foot ulcer L97.528 Non-pressure chronic ulcer of other part of left foot with other specified severity Facility Procedures CPT4 Code: [id number] Description: 21747 - DEB SUBQ TISSUE 20 SQ CM/< ICD-10 Diagnosis Description L97.528 Non-pressure chronic ulcer of other part of left foot with other specified seve Modifier: rity Quantity: 1 Physician Procedures : CPT4 Code Description Modifier [id number] - WC PHYS SUBQ TISS 20 SQ CM ICD-10 Diagnosis Description L97.528 Non-pressure chronic ulcer of other part of left foot with other specified severity Quantity: 1 Electronic Signature(s) Signed: 01/07/2021 5:45:05 PM By: Max Ham MD Entered By: [name] on 01/06/2021 08:24:10

## 2021-01-12 NOTE — Progress Notes (Signed)
[name], [name] ([id number]) Visit Report for 01/06/2021 Arrival Information Details Patient Name: Date of Service: [name] 01/06/2021 7:30 A M Medical Record Number: [id number] Patient Account Number: [id number] Date of Birth/Sex: Treating RN: 1985/05/06 (35 y.o. [name], [name] Primary Care Dayanira Giovannetti: [name] Other Clinician: Referring Shatora Weatherbee: Treating Eustacia Urbanek/Extender: [name] in Treatment: 66 Visit Information History Since Last Visit Added or deleted any medications: No Patient Arrived: Ambulatory Any new allergies or adverse reactions: No Arrival Time: 07:51 Had a fall or experienced change in No Accompanied By: self activities of daily living that may affect Transfer Assistance: None risk of falls: Patient Identification Verified: Yes Signs or symptoms of abuse/neglect since last visito No Secondary Verification Process Completed: Yes Hospitalized since last visit: No Patient Requires Transmission-Based Precautions: No Implantable device outside of the clinic excluding No Patient Has Alerts: No cellular tissue based products placed in the center since last visit: Has Dressing in Place as Prescribed: Yes Pain Present Now: No Electronic Signature(s) Signed: 01/12/2021 5:02:53 PM By: Rhae Hammock RN Entered By: Rhae Hammock on 01/06/2021 07:52:19 -------------------------------------------------------------------------------- Encounter Discharge Information Details Patient Name: Date of Service: [name] 01/06/2021 7:30 A M Medical Record Number: [id number] Patient Account Number: [id number] Date of Birth/Sex: Treating RN: 1985/07/26 (35 y.o. [name] Primary Care Rebeka Kimble: [name] Other Clinician: Referring Rorey Hodges: Treating Jaquelynn Wanamaker/Extender: [name] in Treatment: 28 Encounter Discharge Information Items Post Procedure Vitals Discharge Condition:  Stable Temperature (F): 98.7 Ambulatory Status: Ambulatory Pulse (bpm): 91 Discharge Destination: Home Respiratory Rate (breaths/min): 17 Transportation: Private Auto Blood Pressure (mmHg): 146/94 Accompanied By: alone Schedule Follow-up Appointment: Yes Clinical Summary of Care: Patient Declined Electronic Signature(s) Signed: 01/07/2021 6:21:03 PM By: [name] RN, BSN Entered By: [name] on 01/06/2021 08:45:02 -------------------------------------------------------------------------------- Lower Extremity Assessment Details Patient Name: Date of Service: [name] 01/06/2021 7:30 A M Medical Record Number: [id number] Patient Account Number: [id number] Date of Birth/Sex: Treating RN: 30-Nov-1985 (35 y.o. [name], [name] Primary Care Marquisa Salih: [name] Other Clinician: Referring Johsua Shevlin: Treating Jonathyn Carothers/Extender: [name] in Treatment: 62 Edema Assessment Assessed: [name]: Yes] [name]: No] Edema: [Left: Ye] [Right: s] Calf Left: Right: Point of Measurement: 48 cm From Medial Instep 49.5 cm Ankle Left: Right: Point of Measurement: 11 cm From Medial Instep 30 cm Knee To Floor Left: Right: From Medial Instep 51 cm Vascular Assessment Pulses: Dorsalis Pedis Palpable: [Left:Yes] Posterior Tibial Palpable: [Left:Yes] Electronic Signature(s) Signed: 01/12/2021 5:02:53 PM By: Rhae Hammock RN Entered By: Rhae Hammock on 01/06/2021 07:55:57 -------------------------------------------------------------------------------- Multi Wound Chart Details Patient Name: Date of Service: [name] 01/06/2021 7:30 A M Medical Record Number: [id number] Patient Account Number: [id number] Date of Birth/Sex: Treating RN: Aug 27, 1985 (35 y.o. [name] Primary Care Moraima Burd: [name] Other Clinician: Referring Lewanna Petrak: Treating Fabianna Keats/Extender: [name] in  Treatment: 62 Vital Signs Height(in): 77 Capillary Blood Glucose(mg/dl): 127 Weight(lbs): 280 Pulse(bpm): 35 Body Mass Index(BMI): 33 Blood Pressure(mmHg): 146/94 Temperature(F): 98.7 Respiratory Rate(breaths/min): 17 Photos: [3:Left, Lateral Foot] [N/A:N/A N/A] Wound Location: [3:Trauma] [N/A:N/A] Wounding Event: [3:Diabetic Wound/Ulcer of the Lower] [N/A:N/A] Primary Etiology: [3:Extremity Type II Diabetes] [N/A:N/A] Comorbid History: [3:10/02/2019] [N/A:N/A] Date Acquired: [3:62] [N/A:N/A] Weeks of Treatment: [3:Open] [N/A:N/A] Wound Status: [3:4x3.7x0.4] [N/A:N/A] Measurements L x W x [name] [3:11.624] [N/A:N/A] A (cm) : rea [3:4.65] [N/A:N/A] Volume (cm) : [3:-604.90%] [N/A:N/A] % Reduction in A [3:rea: -2718.20%] [N/A:N/A] % Reduction in Volume: [  3:9] Position 1 (o'clock): [3:2.6] Maximum Distance 1 (cm): [3:12] Starting Position 1 (o'clock): [3:8] Ending Position 1 (o'clock): [3:1] Maximum Distance 1 (cm): [3:Yes] [N/A:N/A] Tunneling: [3:Yes] [N/A:N/A] Undermining: [3:Grade 2] [N/A:N/A] Classification: [3:Medium] [N/A:N/A] Exudate A mount: [3:Serosanguineous] [N/A:N/A] Exudate Type: [3:red, brown] [N/A:N/A] Exudate Color: [3:Thickened] [N/A:N/A] Wound Margin: [3:Large (67-100%)] [N/A:N/A] Granulation A mount: [3:Red, Pink] [N/A:N/A] Granulation Quality: [3:Small (1-33%)] [N/A:N/A] Necrotic A mount: [3:Fat Layer (Subcutaneous Tissue): Yes N/A] Exposed Structures: [3:Fascia: No Tendon: No Muscle: No Joint: No Bone: No None] [N/A:N/A] Epithelialization: [3:Debridement - Excisional] [N/A:N/A] Debridement: Pre-procedure Verification/Time Out 08:10 [N/A:N/A] Taken: [3:Callus, Subcutaneous] [N/A:N/A] Tissue Debrided: [3:Skin/Subcutaneous Tissue] [N/A:N/A] Level: [3:3] [N/A:N/A] Debridement A (sq cm): [3:rea Blade, Forceps] [N/A:N/A] Instrument: [3:Moderate] [N/A:N/A] Bleeding: [3:Pressure] [N/A:N/A] Hemostasis A chieved: [3:0] [N/A:N/A] Procedural Pain: [3:0]  [N/A:N/A] Post Procedural Pain: [3:Procedure was tolerated well] [N/A:N/A] Debridement Treatment Response: [3:4x3.7x0.4] [N/A:N/A] Post Debridement Measurements L x W x [name] [3:4.65] [N/A:N/A] Post Debridement Volume: (cm) [3:Debridement] [N/A:N/A] Treatment Notes Electronic Signature(s) Signed: 01/07/2021 5:45:05 PM By: [name] MD Signed: 01/07/2021 6:21:03 PM By: [name] RN, BSN Entered By: [name] on 01/06/2021 08:16:38 -------------------------------------------------------------------------------- Multi-Disciplinary Care Plan Details Patient Name: Date of Service: [name] 01/06/2021 7:30 A M Medical Record Number: [id number] Patient Account Number: [id number] Date of Birth/Sex: Treating RN: 02-May-1985 (35 y.o. [name] Primary Care Katti Pelle: [name] Other Clinician: Referring Maridel Pixler: Treating Derreck Wiltsey/Extender: [name] in Treatment: 31 Multidisciplinary Care Plan reviewed with physician Active Inactive Nutrition Nursing Diagnoses: Imbalanced nutrition Potential for alteratiion in Nutrition/Potential for imbalanced nutrition Goals: Patient/caregiver agrees to and verbalizes understanding of need to use nutritional supplements and/or vitamins as prescribed Date Initiated: 10/24/2019 Date Inactivated: 04/06/2020 Target Resolution Date: 04/03/2020 Goal Status: Met Patient/caregiver will maintain therapeutic glucose control Date Initiated: 10/24/2019 Target Resolution Date: 01/22/2021 Goal Status: Active Interventions: Assess HgA1c results as ordered upon admission and as needed Assess patient nutrition upon admission and as needed per policy Provide education on elevated blood sugars and impact on wound healing Provide education on nutrition Treatment Activities: Education provided on Nutrition : 11/24/2020 Notes: 11/17/20: Glucose control ongoing issue, target date extended. Wound/Skin  Impairment Nursing Diagnoses: Impaired tissue integrity Knowledge deficit related to ulceration/compromised skin integrity Goals: Patient/caregiver will verbalize understanding of skin care regimen Date Initiated: 10/24/2019 Target Resolution Date: 01/22/2021 Goal Status: Active Ulcer/skin breakdown will have a volume reduction of 30% by week 4 Date Initiated: 10/24/2019 Date Inactivated: 01/16/2020 Target Resolution Date: 01/10/2020 Unmet Reason: no change in Goal Status: Unmet measurements. Interventions: Assess patient/caregiver ability to obtain necessary supplies Assess patient/caregiver ability to perform ulcer/skin care regimen upon admission and as needed Assess ulceration(s) every visit Provide education on ulcer and skin care Notes: 11/17/20: Wound care regimen continues Electronic Signature(s) Signed: 01/07/2021 6:21:03 PM By: [name] RN, BSN Entered By: [name] on 01/06/2021 08:09:32 -------------------------------------------------------------------------------- Pain Assessment Details Patient Name: Date of Service: [name] 01/06/2021 7:30 A M Medical Record Number: [id number] Patient Account Number: [id number] Date of Birth/Sex: Treating RN: 10/03/1985 (35 y.o. [name] Primary Care Aidynn Krenn: [name] Other Clinician: Referring Jazminn Pomales: Treating Garey Alleva/Extender: [name] in Treatment: 62 Active Problems Location of Pain Severity and Description of Pain Patient Has Paino No Site Locations Pain Management and Medication Current Pain Management: Electronic Signature(s) Signed: 01/12/2021 5:02:53 PM By: Rhae Hammock RN Entered By: Rhae Hammock on 01/06/2021 07:52:45 -------------------------------------------------------------------------------- Patient/Caregiver Education Details Patient Name: Date of Service: [name],  [name] 10/5/2022andnbsp7:30 A M Medical Record Number:  [id number] Patient Account Number: [id number] Date of Birth/Gender: Treating RN: October 08, 1985 (35 y.o. [name] Primary Care Physician: [name] Other Clinician: Referring Physician: Treating Physician/Extender: [name] in Treatment: 74 Education Assessment Education Provided To: Patient Education Topics Provided Wound/Skin Impairment: Methods: Explain/Verbal Responses: State content correctly [employer]) Signed: 01/07/2021 6:21:03 PM By: [name] RN, BSN Entered By: [name] on 01/06/2021 08:09:56 -------------------------------------------------------------------------------- Wound Assessment Details Patient Name: Date of Service: [name] 01/06/2021 7:30 A M Medical Record Number: [id number] Patient Account Number: [id number] Date of Birth/Sex: Treating RN: [birth year] (35 y.o. [name] Primary Care Shellene Sweigert: [name] Other Clinician: Referring Han Vejar: Treating Vayla Wilhelmi/Extender: [name] in Treatment: 62 Wound Status Wound Number: 3 Primary Etiology: Diabetic Wound/Ulcer of the Lower Extremity Wound Location: Left, Lateral Foot Wound Status: Open Wounding Event: Trauma Comorbid History: Type II Diabetes Date Acquired: 10/02/2019 Weeks Of Treatment: 62 Clustered Wound: No Photos Wound Measurements Length: (cm) 4 Width: (cm) 3.7 Depth: (cm) 0.4 Area: (cm) 11.624 Volume: (cm) 4.65 % Reduction in Area: -604.9% % Reduction in Volume: -2718.2% Epithelialization: None Tunneling: Yes Position (o'clock): 9 Maximum Distance: (cm) 2.6 Undermining: Yes Starting Position (o'clock): 12 Ending Position (o'clock): 8 Maximum Distance: (cm) 1 Wound Description Classification: Grade 2 Wound Margin: Thickened Exudate Amount: Medium Exudate Type: Serosanguineous Exudate Color: red, brown Foul Odor After Cleansing: No Slough/Fibrino No Wound  Bed Granulation Amount: Large (67-100%) Exposed Structure Granulation Quality: Red, Pink Fascia Exposed: No Necrotic Amount: Small (1-33%) Fat Layer (Subcutaneous Tissue) Exposed: Yes Tendon Exposed: No Muscle Exposed: No Joint Exposed: No Bone Exposed: No Treatment Notes Wound #3 (Foot) Wound Laterality: Left, Lateral Cleanser Soap and Water Discharge Instruction: May shower and wash wound with dial antibacterial soap and water prior to dressing change. Wound Cleanser Discharge Instruction: Cleanse the wound with wound cleanser prior to applying a clean dressing using gauze sponges, not tissue or cotton balls. Peri-Wound Care Zinc Oxide Ointment 30g tube Discharge Instruction: Apply Zinc Oxide to periwound with each dressing change as needed. Topical keystone antibiotic compound Discharge Instruction: thin layer to wound bed Primary Dressing KerraCel Ag Gelling Fiber Dressing, 4x5 in (silver alginate) Discharge Instruction: Apply silver alginate to wound bed as instructed Secondary Dressing Woven Gauze Sponge, Non-Sterile 4x4 in Discharge Instruction: Apply over primary dressing as directed. Zetuvit Plus 4x8 in Discharge Instruction: Apply over primary dressing as directed. Optifoam Non-Adhesive Dressing, 4x4 in Discharge Instruction: Apply over primary dressing as directed. Secured With [employer] 4x5 (in/yd) Discharge Instruction: Secure with Coban as directed. Kerlix Roll Sterile, 4.5x3.1 (in/yd) Discharge Instruction: Secure with Kerlix as directed. 3M Medipore H Soft Cloth Surgical T 4 x 2 (in/yd) ape Discharge Instruction: Secure dressing with tape as directed. Compression Wrap Compression Stockings Add-Ons Electronic Signature(s) Signed: 01/07/2021 6:21:03 PM By: [name] RN, BSN Entered By: [name] on 01/06/2021 08:15:04 -------------------------------------------------------------------------------- [place] Details Patient  Name: Date of Service: [name] 01/06/2021 7:30 A M Medical Record Number: [id number] Patient Account Number: [id number] Date of Birth/Sex: Treating RN: 1985-07-03 (35 y.o. [name], [name] Primary Care Kasiya Burck: [name] Other Clinician: Referring Kellyjo Edgren: Treating Kaisey Huseby/Extender: [name] in Treatment: 62 Vital Signs Time Taken: 07:52 Temperature (F): 98.7 Height (in): 77 Pulse (bpm): 91 Weight (lbs): 280 Respiratory Rate (breaths/min): 17 Body Mass Index (BMI): 33.2 Blood Pressure (mmHg): 146/94 Capillary Blood Glucose (mg/dl): 127 Reference Range: 80 -  120 mg / dl Electronic Signature(s) Signed: 01/12/2021 5:02:53 PM By: Rhae Hammock RN Entered By: Rhae Hammock on 01/06/2021 07:53:18

## 2021-01-13 ENCOUNTER — Other Ambulatory Visit: Payer: Self-pay

## 2021-01-13 ENCOUNTER — Encounter (HOSPITAL_BASED_OUTPATIENT_CLINIC_OR_DEPARTMENT_OTHER): Payer: BC Managed Care – PPO | Admitting: Internal Medicine

## 2021-01-13 DIAGNOSIS — E11621 Type 2 diabetes mellitus with foot ulcer: Secondary | ICD-10-CM | POA: Diagnosis not present

## 2021-01-13 NOTE — Progress Notes (Signed)
[place], [name] ([id number]) Visit Report for 01/13/2021 HPI Details Patient Name: Date of Service: [name] 01/13/2021 7:30 A M Medical Record Number: [id number] Patient Account Number: [id number] Date of Birth/Sex: Treating RN: 10/10/1985 (35 y.o. 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 He comes in for review of a wound over the plantar fifth metatarsal head extending into the lateral part of the foot. He was followed for this previously by his podiatrist Dr. Cornelius Moras. As the patient tells his story he went to see podiatry first for a swelling he developed on the lateral part of his fifth metatarsal head in May. He states this was "open" by podiatry and the area closed. He was followed up in June and it was again opened callus removed and it closed promptly. There were plans being made for surgery on the fifth metatarsal head in June however his blood sugar was apparently too high for anesthesia. Apparently the area was debrided and opened again in June and it is never closed since. Looking over the records from podiatry I am really not able to follow this. It was clear when he was first seen it was before 5/14 at that point he already had a wound. By 5/17 the ulcer was resolved. I do not see anything about a procedure. On 5/28 noted to have pre-ulcerative moderate keratosis. X-ray noted 1/5 contracted toe and tailor's bunion and metatarsal deformity. On a visit date on 09/28/2018 the dorsal part of the left foot it healed and resolved. There was concern about swelling in his lower extremity he was sent to the ER.. As far as I can tell he was seen in the ER on 7/12 with an ulcer on his left foot. A DVT rule  out of the left leg was negative. I do not think I have complete records from podiatry but I am not able to verify the procedures this patient states he had. He states after the last procedure the wound has never closed although I am not able to follow this in the records I have from podiatry. He has not had a recent x-ray The patient has been using Neosporin on the wound. He is wearing a Darco shoe. He is still very active up on his foot working and exercising. Past medical history; type 2 diabetes ketosis-prone, leg swelling with a negative DVT study in July. Non-smoker ABI in our clinic was 0.85 on the left 10/16; substantial wound on the plantar left fifth met head extending laterally almost to the dorsal fifth MTP. We have been using silver alginate we gave him a Darco forefoot off loader. An x-ray did not show evidence of osteomyelitis did note soft tissue emphysema which I think was due to gas tracking through an open wound. There is no doubt in my mind he requires an MRI 10/23; MRI not booked until 34 November at the earliest this is largely due to his glucose sensor in the right arm. We have been using silver alginate. There has been an improvement 10/29; I am still not exactly sure when his MRI is booked for. He says it is the third but it is the 10th in epic. This definitely needs to be done. He is running a low-grade fever today but no other symptoms. No real  improvement in the 1 02/26/2019 patient presents today for a follow-up visit here in our clinic he is last been seen in the clinic on October 29. Subsequently we were working on getting MRI to evaluate and see what exactly was going on and where we would need to go from the standpoint of whether or not he had osteomyelitis and again what treatments were going be required. Subsequently the patient ended up being admitted to the hospital on 02/07/2019 and was discharged on 02/14/2019. This is a somewhat interesting admission with a  discharge diagnosis of pneumonia due to COVID-19 although he was positive for COVID-19 when tested at the urgent care but negative x2 when he was actually in the hospital. With that being said he did have acute respiratory failure with hypoxia and it was noted he also have a left foot ulceration with osteomyelitis. With that being said he did require oxygen for his pneumonia and I level 4 L. He was placed on antivirals and steroids for the COVID-19. He was also transferred to the [facility] at one point. Nonetheless he did subsequently discharged home and since being home has done much better in that regard. The CT angiogram did not show any pulmonary embolism. With regard to the osteomyelitis the patient was placed on vancomycin and Zosyn while in the hospital but has been changed to Augmentin at discharge. It was also recommended that he follow- up with wound care and podiatry. Podiatry however wanted him to see [name] according to the patient prior to them doing anything further. His hemoglobin A1c was 9.9 as noted in the hospital. Have an MRI of the left foot performed while in the hospital on 02/04/2019. 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 There is an overlying plantar open wound noted an abscess tracking back along the lateral aspect of the fifth metatarsal shaft. There is otherwise diffuse cellulitis and mild fasciitis without findings of polymyositis. The patient did have recently pneumonia secondary to COVID-19 I looked in the chart through epic and it does appear that the patient may need to have an additional x-ray just to ensure everything is cleared and that he has no airspace disease prior to putting him into the chamber. 03/05/2019; patient was readmitted to the clinic last week. He was hospitalized twice for a viral upper respiratory tract infection from 11/1 through 11/4 and  then 11/5 through 11/12 ultimately this turned out to be Covid pneumonitis. Although he was discharged on oxygen he is not using it. He says he feels fine. He has no exercise limitation no cough no sputum. His O2 sat in our clinic today was 100% on room air. 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 He received Vanco and Zosyn in the hospital and then was discharged on 2 weeks of Augmentin. I do not see any relevant cultures. He was supposed to follow-up with infectious disease but I do not see that he has an appointment. 12/8; patient saw Dr. Novella Olive of infectious disease last week. He felt that he had had adequate antibiotic therapy. He did not go to follow-up with Dr. Amalia Hailey of podiatry and I have again talked to him about the pros and cons of this. He does not want to consider a ray amputation of this time. He is aware of the risks  of recurrence, migration etc. He started HBO today and tolerated this well. He can complete the Augmentin that I gave him last week. I have looked over the lab work that Dr. Chana Bode ordered his C-reactive protein was 3.3 and his sedimentation rate was 17. The C-reactive protein is never really been measurably that high in this patient 12/15; not much change in the wound today however he has undermining along the lateral part of the foot again more extensively than last week. He has some rims of epithelialization. We have been using silver alginate. He is undergoing hyperbarics but did not dive today 12/18; in for his obligatory first total contact cast change. Unfortunately there was pus coming from the undermining area around his fifth metatarsal head. This was cultured but will preclude reapplication of a cast. He is seen in conjunction with HBO 12/24; patient had staph lugdunensis in the wound in the undermining area laterally last time. We put him on  doxycycline which should have covered this. The wound looks better today. I am going to give him another week of doxycycline before reattempting the total contact cast 12/31; the patient is completing antibiotics. Hemorrhagic debris in the distal part of the wound with some undermining distally. He also had hyper granulation. Extensive debridement with a #5 curette. The infected area that was on the lateral part of the fifth met head is closed over. I do not think he needs any more antibiotics. Patient was seen prior to HBO. Preparations for a total contact cast were made in the cast will be placed post hyperbarics 04/11/19; once again the patient arrives today without complaint. He had been in a cast all week noted that he had heavy drainage this week. This resulted in large raised areas of macerated tissue around the wound 1/14; wound bed looks better slightly smaller. Hydrofera Blue has been changing himself. He had a heavy drainage last week which caused a lot of maceration around the wound so I took him out of a total contact cast he says the drainage is actually better this week He is seen today in conjunction with HBO 1/21; returns to clinic. He was up in [place] for a day or 2 attending a funeral. He comes back in with the wound larger and with a large area of exposed bone. He had osteomyelitis and septic arthritis of the fifth left metatarsal head while he was in hospital. He received IV antibiotics in the hospital for a prolonged period of time then 3 weeks of Augmentin. Subsequently I gave him 2 weeks of doxycycline for more superficial wound infection. When I saw this last week the wound was smaller the surface of the wound looks satisfactory. 1/28; patient missed hyperbarics today. Bone biopsy I did last time showed Enterococcus faecalis and Staphylococcus lugdunensis . He has a wide area of exposed bone. We are going to use silver alginate as of today. I had another ethical discussion  with the patient. 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 In this situation I think the likelihood of healing this is low. Therefore I have recommended a ray amputation and with the patient's agreement I have referred him to Dr. Doran Durand. The other issue is that his compliance with hyperbarics has been minimal because of his work schedule and given his underlying decision I am going to stop this today READMISSION 10/24/2019 MRI 09/29/2019 left foot IMPRESSION: 1. Apparent skin ulceration inferior and lateral to the 5th metatarsal base with underlying heterogeneous T2  signal and enhancement in the subcutaneous fat. Small peripherally enhancing fluid collections along the plantar and lateral aspects of the 5th metatarsal base suspicious for abscesses. 2. Interval amputation through the mid 5th metatarsal with nonspecific low-level marrow edema and enhancement. Given the proximity to the adjacent soft tissue inflammatory changes, osteomyelitis cannot be excluded. 3. The additional bones appear unremarkable. MRI 09/29/2019 right foot IMPRESSION: 1. Soft tissue ulceration lateral to the 5th MTP joint. There is low-level T2 hyperintensity within the 4th and 5th metatarsal heads and adjacent proximal phalanges without abnormal T1 signal or cortical destruction. These findings are nonspecific and could be seen with early marrow edema, hyperemia or early osteomyelitis. No evidence of septic joint. 2. Mild tenosynovitis and synovial enhancement associated with the extensor digitorum tendons at the level of the midfoot. 3. Diffuse low-level muscular T2 hyperintensity and enhancement, most consistent with diabetic myopathy. LEFT FOOT BONE Methicillin resistant staphylococcus aureus Staphylococcus lugdunensis MIC MIC CIPROFLOXACIN >=8 RESISTANT Resistant <=0.5 SENSI... Sensitive CLINDAMYCIN <=0.25 SENS... Sensitive >=8 RESISTANT Resistant ERYTHROMYCIN >=8  RESISTANT Resistant >=8 RESISTANT Resistant GENTAMICIN <=0.5 SENSI... Sensitive <=0.5 SENSI... Sensitive Inducible Clindamycin NEGATIVE Sensitive NEGATIVE Sensitive OXACILLIN >=4 RESISTANT Resistant 2 SENSITIVE Sensitive RIFAMPIN <=0.5 SENSI... Sensitive <=0.5 SENSI... Sensitive TETRACYCLINE <=1 SENSITIVE Sensitive <=1 SENSITIVE Sensitive TRIMETH/SULFA <=10 SENSIT Sensitive <=10 SENSIT Sensitive ... Marland Kitchen.. VANCOMYCIN 1 SENSITIVE Sensitive <=0.5 SENSI... Sensitive Right foot bone . Component 3 wk ago Specimen Description BONE Special Requests RIGHT 4 METATARSAL SAMPLE B Gram Stain NO WBC SEEN NO ORGANISMS SEEN Culture RARE METHICILLIN RESISTANT STAPHYLOCOCCUS AUREUS NO ANAEROBES ISOLATED Performed at [facility], [place] [address], [place], [place] [address] Report Status 10/08/2019 FINAL Organism ID, Bacteria METHICILLIN RESISTANT STAPHYLOCOCCUS AUREUS Resulting Agency [facility] Susceptibility Methicillin resistant staphylococcus aureus MIC CIPROFLOXACIN >=8 RESISTANT Resistant CLINDAMYCIN <=0.25 SENS... Sensitive ERYTHROMYCIN >=8 RESISTANT Resistant GENTAMICIN <=0.5 SENSI... Sensitive Inducible Clindamycin NEGATIVE Sensitive OXACILLIN >=4 RESISTANT Resistant RIFAMPIN <=0.5 SENSI... Sensitive TETRACYCLINE <=1 SENSITIVE Sensitive TRIMETH/SULFA <=10 SENSIT Sensitive ... VANCOMYCIN 1 SENSITIVE Sensitive This is a patient we had in clinic earlier this year with a wound over his left fifth metatarsal head. 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 Doran Durand on 05/16/2019. He seems to have developed problems with wounds on his bilateral feet in June 2021 although he may have had problems earlier than  this. He was in an urgent care with a right foot ulcer on 09/26/2019 and given a course of doxycycline. This was apparently after having trouble getting into see orthopedics. He was seen by podiatry on 09/28/2019 noted to have bilateral lower extremity ulcers including the left lateral fifth metatarsal base and the right subfifth met head. It was noted that had purulent drainage at that time. He required hospitalization from 6/20 through 7/2. This was because of worsening right foot wounds. He underwent bilateral operative incision and drainage and bone biopsies bilaterally. Culture results are listed above. He has been referred back to clinic by Dr. Jacqualyn Posey of podiatry. He is also followed by Dr. Megan Salon who saw him yesterday. He was discharged from hospital on Zyvox Flagyl and Levaquin and yesterday changed to doxycycline Flagyl and Levaquin. His inflammatory markers on 6/26 showed a sedimentation rate of 129 and a C-reactive protein of 5. This  is improved to 14 and 1.3 respectively. This would indicate improvement. ABIs in our clinic today were 1.23 on the right and 1.20 on the left 11/01/2019 on evaluation today patient appears to be doing fairly well in regard to the wounds on his feet at this point. Fortunately there is no signs of active infection at this time. No fevers, chills, nausea, vomiting, or diarrhea. He currently is seeing infectious disease and still under their care at this point. Subsequently he also has both wounds which she has not been using collagen on as he did not receive that in his packaging he did not call us and let us know that. Apparently that just was missed on the order. Nonetheless we will get that straightened out today. 8/9-Patient returns for bilateral foot wounds, using Prisma with hydrogel moistened dressings, and the wounds appear stable. Patient using surgical shoes, avoiding much pressure or weightbearing as much as possible 8/16; patient has bilateral foot  wounds. 1 on the right lateral foot proximally the other is on the left mid lateral foot. Both required debridement of callus and thick skin around the wounds. We have been using silver collagen 8/27; patient has bilateral lateral foot wounds. The area on the left substantially surrounded by callus and dry skin. This was removed from the wound edge. The underlying wound is small. The area on the right measured somewhat smaller today. 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 Unfortunately I did not update his antibiotics during today's visit. 9/10 I reviewed Dr. Hale Bogus last notes he felt he had completed antibiotics his inflammatory markers were reasonably well controlled. He has a small wound on the lateral left foot and a tiny area on the right which is just above closed. He is using Hydrofera Blue with border foam he has bilateral surgical shoes 9/24; 2 week f/u. doing well. right foot is closed. left foot still undermined. 10/14; right foot remains closed at the fifth met head. The area over the base of the left fifth metatarsal has a small open area but considerable undermining towards the plantar foot. Thick callus skin around this suggests an adequate pressure relief. We have talked about this. He says he is going to go back into his cam boot. I suggested a total contact cast he did not seem enamored with this suggestion 10/26; left foot base of the fifth metatarsal. Same condition as last time. He has skin over the area with an open wound however the skin is not adherent. He went to see Dr. Earleen Newport who did an x-ray and culture of his foot I have not reviewed the x-ray but the patient was not told anything. He is on doxycycline 11/11; since the patient was last here he was in the emergency room on 10/30 he was concerned about swelling in the left foot. They did not do any cultures or x-rays. They changed his antibiotics to  cephalexin. Previous culture showed group B strep. The cephalexin is appropriate as doxycycline has less than predictable coverage. Arrives in clinic today with swelling over this area under the wound. He also has a new wound on the right fifth metatarsal head 11/18; the patient has a difficult wound on the lateral aspect of the left fifth metatarsal head. The wound was almost ballotable last week I opened it slightly expecting to see purulence however there was just bleeding. I cultured this this was negative. X-ray unchanged. We are trying to get an  MRI but I am not sure were going to be able to get this through his insurance. He also has an area on the right lateral fifth metatarsal head this looks healthier 12/3; the patient finally got our MRI. Surprisingly this did not show osteomyelitis. I did show the soft tissue ulceration at the lateral plantar aspect of the fifth metatarsal base with a tiny residual 6 mm abscess overlying the superficial fascia I have tried to culture this area I have not been able to get this to grow anything. Nevertheless the protruding tissue looks aggravated. I suspect we should try to treat the underlying "abscess with broad-spectrum antibiotics. I am going to start him on Levaquin and Flagyl. He has much less edema in his legs and I am going to continue to wrap his legs and see him weekly 12/10. I started Levaquin and Flagyl on him last week. He just picked up the Flagyl apparently there was some delay. The worry is the wound on the left fifth metatarsal base which is substantial and worsening. His foot looks like he inverts at the ankle making this a weightbearing surface. Certainly no improvement in fact I think the measurements of this are somewhat worse. We have been using 12/17; he apparently just got the Levaquin yesterday this is 2 weeks after the fact. He has completed the Flagyl. The area over the left fifth metatarsal base still has protruding granulation  tissue although it does not look quite as bad as it did some weeks ago. He has severe bilateral lymphedema although we have not been treating him for wounds on his legs this is definitely going to require compression. There was so much edema in the left I did not wish to put him in a total contact cast today. I am going to increase his compression from 3-4 layer. The area on the right lateral fifth met head actually look quite good and superficial. 12/23; patient arrived with callus on the right fifth met head and the substantial hyper granulated callused wound on the base of his fifth metatarsal. He says he is completing his Levaquin in 2 days but I do not think that adds up with what I gave him but I will have to double check this. We are using Hydrofera Blue on both areas. My plan is to put the left leg in a cast the week after New Year's 04/06/2020; patient's wounds about the same. Right lateral fifth metatarsal head and left lateral foot over the base of the fifth metatarsal. There is undermining on the left lateral foot which I removed before application of total contact cast continuing with Hydrofera Blue new. Patient tells me he was seen by endocrinology today lab work was done [Dr. Kerr]. Also wondering whether he was referred to cardiology. I went over some lab work from previously does not have chronic renal failure certainly not nephrotic range proteinuria he does have very poorly controlled diabetes but this is not his most updated lab work. Hemoglobin A1c has been over 11 1/10; the patient had a considerable amount of leakage towards mid part of his left foot with macerated skin however the wound surface looks better the area on the right lateral fifth met head is better as well. I am going to change the dressing on the left foot under the total contact cast to silver alginate, continue with Hydrofera Blue on the right. 1/20; patient was in the total contact cast for 10 days. Considerable  amount of drainage although the skin  around the wound does not look too bad on the left foot. The area on the right fifth metatarsal head is closed. Our nursing staff reports large amount of drainage out of the left lateral foot wound 1/25; continues with copious amounts of drainage described by our intake staff. PCR culture I did last week showed E. coli and Enterococcus faecalis and low quantities. Multiple resistance genes documented including extended spectrum beta lactamase, MRSA, MRSE, quinolone, tetracycline. The wound is not quite as good this week as it was 5 days ago but about the same size 2/3; continues with copious amounts of malodorous drainage per our intake nurse. The PCR culture I did 2 weeks ago showed E. coli and low quantities of Enterococcus. There were multiple resistance genes detected. I put Neosporin on him last week although this does not seem to have helped. The wound is slightly deeper today. Offloading continues to be an issue here although with the amount of drainage she has a total contact cast is just not going to work 2/10; moderate amount of drainage. Patient reports he cannot get his stocking on over the dressing. I told him we have to do that the nurse gave him suggestions on how to make this work. The wound is on the bottom and lateral part of his left foot. Is cultured predominantly grew low amounts of Enterococcus, E. coli and anaerobes. There were multiple resistance genes detected including extended spectrum beta lactamase, quinolone, tetracycline. I could not think of an easy oral combination to address this so for now I am going to do topical antibiotics provided by [facility] I think the main agents here are vancomycin and an aminoglycoside. We have to be able to give him access to the wounds to get the topical antibiotic on 2/17; moderate amount of drainage this is unchanged. He has his Keystone topical antibiotic against the deep tissue culture organisms. He  has been using this and changing the dressing daily. Silver alginate on the wound surface. 2/24; using Keystone antibiotic with silver alginate on the top. He had too much drainage for a total contact cast at one point although I think that is improving and I think in the next week or 2 it might be possible to replace a total contact cast I did not do this today. In general the wound surface looks healthy however he continues to have thick rims of skin and subcutaneous tissue around the wide area of the circumference which I debrided 06/04/2020 upon evaluation today patient appears to be doing well in regard to his wound. I do feel like he is showing signs of improvement. There is little bit of callus and dead tissue around the edges of the wound as well as what appears to be a little bit of a sinus tract that is off to the side laterally I would perform debridement to clear that away today. 3/17; left lateral foot. The wound looks about the same as I remember. Not much depth surface looks healthy. No evidence of infection 3/25; left lateral foot. Wound surface looks about the same. Separating epithelium from the circumference. There really is no evidence of infection here however not making progress by my view 3/29; left lateral foot. Surface of the wound again looks reasonably healthy still thick skin and subcutaneous tissue around the wound margins. There is no evidence of infection. One of the concerns being brought up by the nurses has again the amount of drainage vis--vis continued use of a total contact cast 4/5;  left lateral foot at roughly the base of the fifth metatarsal. Nice healthy looking granulated tissue with rims of epithelialization. The overall wound measurements are not any better but the tissue looks healthy. The only concern is the amount of drainage although he has no surrounding maceration with what we have been doing recently to absorb fluid and protect his skin. He also has  lymphedema. 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 He states that the kerlix/coban under the TCC rolled down and could not get it back up. He has been using an offloading scooter and has somehow hurt his right foot using this device. This happened last week. He states that the side of his right foot developed a blister and opened. The top of his foot also has a few small open wounds he thinks is due to his socks rubbing in his shoes. He has not been using any dressings to the wound. He denies purulent drainage, fever/chills or erythema to the wounds. 4/22; patient presents for 1 week follow-up. He developed new wounds to the right foot that were evaluated at last clinic visit. He continues to have a total contact cast to the left leg and he reports no issues. He has been using silver collagen to the right foot wounds with no issues. He denies purulent drainage, fever/chills or erythema to the right foot wounds. He has no complaints today 4/25; patient presents for 1 week follow-up. He has a total contact cast of the left leg and reports no issues. He has been using silver alginate to the right foot wound. He denies purulent drainage, fever/chills or erythema to the right foot wounds. 5/2 patient presents for 1 week follow-up. T contact cast on the left. The wound which is on the base of the plantar foot at the base of the fifth metatarsal otal actually looks quite good and dimensions continue to gradually contract. HOWEVER the area on the right lateral fifth metatarsal head is much larger than what I remember from 2 weeks ago. Once more is he has significant levels of hypergranulation. Noteworthy that he had this same hyper granulated response on his  wound on the left foot at one point in time. So much so that he I thought there was an underlying fluid collection. Based on this I think this just needs debridement. 5/9; the wound on the left actually continues to be gradually smaller with a healthy surface. Slight amount of drainage and maceration of the skin around but not too bad. However he has a large wound over the right fifth metatarsal head very much in the same configuration as his left foot wound was initially. I used silver nitrate to address the hyper granulated tissue no mechanical debridement 5/16; area on the left foot did not look as healthy this week deeper thick surrounding macerated skin and subcutaneous tissue. The area on the right foot fifth met head was about the same The area on the right ankle that we identified last week is completely broken down into an open wound presumably a stocking rubbing issue 5/23; patient has been using a total contact cast to the left side. He has been using silver alginate underneath. He has also been using silver alginate to the right  foot wounds. He has no complaints today. He denies any signs of infection. 5/31; the left-sided wound looks some better measure smaller surface granulation looks better. We have been using silver alginate under the total contact cast The large area on his right fifth met head and right dorsal foot look about the same still using silver alginate 6/6; neither side is good as I was hoping although the surface area dimensions are better. A lot of maceration on his left and right foot around the wound edge. Area on the dorsal right foot looks better. He says he was traveling. I am not sure what does the amount of maceration around the plantar wounds may be drainage issues 6/13; in general the wound surfaces look quite good on both sides. Macerated skin and raised edges around the wound required debridement although in general especially on the left the surface area  seems improved. The area on the right dorsal ankle is about the same I thought this would not be such a problem to close 6/20; not much change in either wound although the one on the right looks a little better. Both wounds have thick macerated edges to the skin requiring debridements. We have been using silver alginate. The area on the dorsal right ankle is still open I thought this would be closed. 6/28; patient comes in today with a marked deterioration in the right foot wound fifth met head. Wide area of exposed bone this is a drastic change from last time. The area on the left there we have been casting is stagnant. We have been using silver alginate in both wound areas. 7/5; bone culture I did for PCR last time was positive for Pseudomonas, group B strep, Enterococcus and Staph aureus. There was no suggestion of methicillin resistance or ampicillin resistant genes. This was resistant to tetracycline however He comes into the clinic today with the area over his right plantar fifth metatarsal head which had been doing so well 2 weeks ago completely necrotic feeling bone. I do not know that this is going to be salvageable. The left foot wound is certainly no smaller but it has a better surface and is superficial. 7/8; patient called in this morning to say that his total contact cast was rubbing against his foot. He states he is doing fine overall. He denies signs of infection. 7/12; continued deterioration in the wound over the right fifth metatarsal head crumbling bone. This is not going to be salvageable. The patient agrees and wants to be referred to Dr. Doran Durand which we will attempt to arrange as soon as possible. I am going to continue him on antibiotics as long as that takes so I will renew those today. The area on the left foot which is the base of the fifth metatarsal continues to look somewhat better. Healthy looking tissue no depth no debridement is necessary here. 7/20; the patient was  kindly seen by Dr. Doran Durand of orthopedics on 10/19/2020. 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 Towards this end we have taken him out of the total contact cast on the left we will put him in a wrap with Hydrofera Blue. As I understand things surgery is planned for 7/21 7/27; patient had his surgery last Thursday. 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 Apparently everything went well we did not still disturb that today The area on the left foot  actually looks quite good. He has been much less mobile which probably explains this he did not seem to do well in the total contact cast secondary to drainage and maceration I think. 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 Fortunately there is no evidence of active infection at this time. No fevers, chills, nausea, vomiting, or diarrhea. Overall I think that he is actually doing extremely well. Nonetheless I do believe that he is staying off of this more following the surgery in his right foot that is the reason the left is doing so great. 8/16; left plantar foot wound. This looks smaller than the last time I saw this he is using Hydrofera Blue. The surgical wound on the right foot is being followed by Dr. Doran Durand we did not look at this today. He has surgical shoes on both feet 8/23; left plantar foot wound not as good this week. Surrounding macerated skin and subcutaneous tissue everything looks moist and wet. I do not think he is offloading this adequately. He is using a surgical shoe Apparently the right foot surgical wound is not open although I did not check his foot 8/31; left plantar foot lateral aspect. Much improved this week. He has no maceration. Some improvement in the surface area of the wound but most impressively the depth is come in we  are using silver alginate. The patient is a Product/process development scientist. He is asked that we write him a letter so he can go back to work. I have also tried to see if we can write something that will allow him to limit the amount of time that he is on his foot at work. 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 Hopefully the latter can be avoided 9/6; I believe the patient missed an appointment last week. He arrives in today with a wound looking roughly the same certainly no better. Undermining laterally and also inferiorly. We used molecuLight today in training with the patient's permission.. We are using silver alginate 9/21 wound is measuring bigger this week although this may have to do with the aggressive circumferential debridement last week in response to the blush fluorescence on the MolecuLight. Culture I did last week showed significant MSSA and E. coli. I put him on Augmentin but he has not started it yet. We are also going to send this for compounded antibiotics at [facility] There is no evidence of systemic infection 9/29; silver alginate. His Keystone arrived. He is completing Augmentin in 2 days. Offloading in a cam boot. Moderate drainage per our intake staff 10/5; using silver alginate. He has been using his [facility]. He has completed his Augmentin. Per our intake nurse still a lot of drainage, far too much to consider a total contact cast. Wound measures about the same. He had the same undermining area that I defined last week from a roughly 11-3. I remove this today 10/12; using silver alginate he is using the [employer]. He comes in for a nurse visit hence we are applying [employer] twice a week. Measuring slightly better today and less notable drainage. Extensive debridement of the wound edge last time Electronic Signature(s) Signed: 01/13/2021 4:37:06 PM By: [name] MD Entered By: [name] on  01/13/2021 08:30:50 -------------------------------------------------------------------------------- Physical Exam Details Patient Name: Date of Service: [name],  [name] 01/13/2021 7:30 A M Medical Record Number: [id number] Patient Account Number: [id number] Date of Birth/Sex: Treating RN: December 29, 1985 (35 y.o. [name] Primary Care Provider: [name] Other Clinician: Referring Provider: Treating Provider/Extender: [name] in Treatment: 63 Constitutional Sitting or standing Blood Pressure is within target range for patient.. Pulse regular and within target range for patient.Marland Kitchen Respirations regular, non-labored and within target range.. Temperature is normal and within the target range for the patient.Marland Kitchen Appears in no distress. Notes Wound exam; left plantar foot base of the fifth metatarsal. Apparently measuring slightly better in terms of surface area. Wound bed also looks better. He still has the thick raised edges of the circumference although not as bad as it has been and no undermining which is an improvement. As noted the drainage was better. No surrounding erythema no obvious active infection Electronic Signature(s) Signed: 01/13/2021 4:37:06 PM By: [name] MD Entered By: [name] on 01/13/2021 08:32:18 -------------------------------------------------------------------------------- Physician Orders Details Patient Name: Date of Service: [name], [name] 01/13/2021 7:30 A M Medical Record Number: [id number] Patient Account Number: [id number] Date of Birth/Sex: Treating RN: February 17, 1986 (35 y.o. [name] Primary Care Provider: [name] Other Clinician: Referring Provider: Treating Provider/Extender: [name] in Treatment: 71 Verbal / Phone Orders: No Diagnosis Coding ICD-10 Coding Code Description E11.621 Type 2 diabetes mellitus with foot ulcer L97.528 Non-pressure chronic  ulcer of other part of left foot with other specified severity Follow-up Appointments ppointment in 1 week. - with Dr. Dellia Nims early next week Return A Nurse Visit: - Friday 10/14 to change soft cast Edema Control - Lymphedema / SCD / Other Bilateral Lower Extremities Elevate legs to the level of the heart or above for 30 minutes daily and/or when sitting, a frequency of: - throughout the day Avoid standing for long periods of time. Exercise regularly Moisturize legs daily. - right leg every night before bed. Compression stocking or Garment 20-30 mm/Hg pressure to: - Apply to right leg in the morning and remove at night. Off-Loading Open toe surgical shoe to: - Left Foot with soft cast Other: - minimal weight bearing left foot Additional Orders / Instructions Follow Nutritious Diet Wound Treatment Wound #3 - Foot Wound Laterality: Left, Lateral Cleanser: Soap and Water 2 x Per Week/30 Days Discharge Instructions: May shower and wash wound with dial antibacterial soap and water prior to dressing change. Cleanser: Wound Cleanser (Generic) 2 x Per Week/30 Days Discharge Instructions: Cleanse the wound with wound cleanser prior to applying a clean dressing using gauze sponges, not tissue or cotton balls. Peri-Wound Care: Zinc Oxide Ointment 30g tube 2 x Per Week/30 Days Discharge Instructions: Apply Zinc Oxide to periwound with each dressing change as needed. Topical: keystone antibiotic compound 2 x Per Week/30 Days Discharge Instructions: thin layer to wound bed Prim Dressing: KerraCel Ag Gelling Fiber Dressing, 4x5 in (silver alginate) (Generic) 2 x Per Week/30 Days ary Discharge Instructions: Apply silver alginate to wound bed as instructed Secondary Dressing: Woven Gauze Sponge, Non-Sterile 4x4 in (Generic) 2 x Per Week/30 Days Discharge Instructions: Apply over primary dressing as directed. Secondary Dressing: Zetuvit Plus 4x8 in (Generic) 2 x Per Week/30 Days Discharge  Instructions: Apply over primary dressing as directed. Secondary Dressing: Optifoam Non-Adhesive Dressing, 4x4 in (Generic) 2 x Per Week/30 Days Discharge Instructions: Apply over primary dressing as directed. Secured With: Coban Self-Adherent Wrap 4x5 (in/yd) (Generic) 2 x Per Week/30 Days Discharge Instructions: Secure with Coban as directed.  Secured With: [employer], 4.5x3.1 (in/yd) (Generic) 2 x Per Week/30 Days Discharge Instructions: Secure with Kerlix as directed. Secured With: 42M Medipore H Soft Cloth Surgical T 4 x 2 (in/yd) (Generic) 2 x Per Week/30 Days ape Discharge Instructions: Secure dressing with tape as directed. Add-Ons: Soft Cast 2 x Per Week/30 Days Electronic Signature(s) Signed: 01/13/2021 4:37:06 PM By: [name] MD Signed: 01/13/2021 4:54:33 PM By: [name] Entered By: [name] on 01/13/2021 08:14:46 -------------------------------------------------------------------------------- Problem List Details Patient Name: Date of Service: [name], [name] 01/13/2021 7:30 A M Medical Record Number: [id number] Patient Account Number: [id number] Date of Birth/Sex: Treating RN: 12-29-1985 (35 y.o. [name] Primary Care Provider: [name] Other Clinician: Referring Provider: Treating Provider/Extender: [name] in Treatment: 23 Active Problems ICD-10 Encounter Code Description Active Date MDM Diagnosis E11.621 Type 2 diabetes mellitus with foot ulcer 10/24/2019 No Yes L97.528 Non-pressure chronic ulcer of other part of left foot with other specified 10/24/2019 No Yes severity Inactive Problems ICD-10 Code Description Active Date Inactive Date L97.518 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ity 10/24/2019 10/24/2019 L97.518 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ity 07/14/2020 07/14/2020 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, right ankle and foot 10/24/2019 10/24/2019 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 08/10/2020 08/10/2020 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, left ankle and foot 10/24/2019 10/24/2019 B95.62 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 10/24/2019 10/24/2019 classified elsewhere Resolved Problems Electronic Signature(s) Signed: 01/13/2021 4:37:06 PM By: [name] MD Entered By: [name] on 01/13/2021 08:29:41 -------------------------------------------------------------------------------- Progress Note Details Patient Name: Date of Service: [name], [name] 01/13/2021 7:30 A M Medical Record Number: [id number] Patient Account Number: [id number] Date of Birth/Sex: Treating RN: April 07, 1985 (35 y.o. 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 He comes in for review of a wound over the plantar fifth metatarsal head extending into the lateral part of the foot. He was followed for this previously by his podiatrist Dr. Cornelius Moras. As the patient tells his story he went to see podiatry first for a swelling he developed on the lateral part of his fifth metatarsal head in May. He states this was "open" by podiatry and the area closed. He was followed up in June and it was again opened callus removed and it closed promptly. There were plans being made for surgery on the fifth metatarsal head in June however his blood sugar was apparently too high for anesthesia. Apparently the area was debrided and opened again in June and it is never closed since. Looking over the records from podiatry I am really not able to follow this. It  was clear when he was first seen it was before 5/14 at that point he already had a wound. By 5/17 the ulcer was resolved. I do not see anything about a procedure. On 5/28 noted to have pre-ulcerative moderate keratosis. X-ray noted 1/5 contracted toe and tailor's bunion and metatarsal deformity. On a visit date on 09/28/2018 the dorsal part of the left foot it healed and resolved. There was concern about swelling in his lower extremity he was sent to the ER.. As far as I can tell he was  seen in the ER on 7/12 with an ulcer on his left foot. A DVT rule out of the left leg was negative. I do not think I have complete records from podiatry but I am not able to verify the procedures this patient states he had. He states after the last procedure the wound has never closed although I am not able to follow this in the records I have from podiatry. He has not had a recent x-ray The patient has been using Neosporin on the wound. He is wearing a Darco shoe. He is still very active up on his foot working and exercising. Past medical history; type 2 diabetes ketosis-prone, leg swelling with a negative DVT study in July. Non-smoker ABI in our clinic was 0.85 on the left 10/16; substantial wound on the plantar left fifth met head extending laterally almost to the dorsal fifth MTP. We have been using silver alginate we gave him a Darco forefoot off loader. An x-ray did not show evidence of osteomyelitis did note soft tissue emphysema which I think was due to gas tracking through an open wound. There is no doubt in my mind he requires an MRI 10/23; MRI not booked until 34 November at the earliest this is largely due to his glucose sensor in the right arm. We have been using silver alginate. There has been an improvement 10/29; I am still not exactly sure when his MRI is booked for. He says it is the third but it is the 10th in epic. This definitely needs to be done. He is running a low-grade fever today but no other  symptoms. No real improvement in the 1 02/26/2019 patient presents today for a follow-up visit here in our clinic he is last been seen in the clinic on October 29. Subsequently we were working on getting MRI to evaluate and see what exactly was going on and where we would need to go from the standpoint of whether or not he had osteomyelitis and again what treatments were going be required. Subsequently the patient ended up being admitted to the hospital on 02/07/2019 and was discharged on 02/14/2019. This is a somewhat interesting admission with a discharge diagnosis of pneumonia due to COVID-19 although he was positive for COVID-19 when tested at the urgent care but negative x2 when he was actually in the hospital. With that being said he did have acute respiratory failure with hypoxia and it was noted he also have a left foot ulceration with osteomyelitis. With that being said he did require oxygen for his pneumonia and I level 4 L. He was placed on antivirals and steroids for the COVID-19. He was also transferred to the [facility] at one point. Nonetheless he did subsequently discharged home and since being home has done much better in that regard. The CT angiogram did not show any pulmonary embolism. With regard to the osteomyelitis the patient was placed on vancomycin and Zosyn while in the hospital but has been changed to Augmentin at discharge. It was also recommended that he follow- up with wound care and podiatry. Podiatry however wanted him to see [name] according to the patient prior to them doing anything further. His hemoglobin A1c was 9.9 as noted in the hospital. Have an MRI of the left foot performed while in the hospital on 02/04/2019. 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 There is an overlying plantar open wound noted an  abscess tracking back along the lateral aspect of the fifth  metatarsal shaft. There is otherwise diffuse cellulitis and mild fasciitis without findings of polymyositis. The patient did have recently pneumonia secondary to COVID-19 I looked in the chart through epic and it does appear that the patient may need to have an additional x-ray just to ensure everything is cleared and that he has no airspace disease prior to putting him into the chamber. 03/05/2019; patient was readmitted to the clinic last week. He was hospitalized twice for a viral upper respiratory tract infection from 11/1 through 11/4 and then 11/5 through 11/12 ultimately this turned out to be Covid pneumonitis. Although he was discharged on oxygen he is not using it. He says he feels fine. He has no exercise limitation no cough no sputum. His O2 sat in our clinic today was 100% on room air. 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 He received Vanco and Zosyn in the hospital and then was discharged on 2 weeks of Augmentin. I do not see any relevant cultures. He was supposed to follow-up with infectious disease but I do not see that he has an appointment. 12/8; patient saw Dr. Novella Olive of infectious disease last week. He felt that he had had adequate antibiotic therapy. He did not go to follow-up with Dr. Amalia Hailey of podiatry and I have again talked to him about the pros and cons of this. He does not want to consider a ray amputation of this time. He is aware of the risks of recurrence, migration etc. He started HBO today and tolerated this well. He can complete the Augmentin that I gave him last week. I have looked over the lab work that Dr. Chana Bode ordered his C-reactive protein was 3.3 and his sedimentation rate was 17. The C-reactive protein is never really been measurably that high in this patient 12/15; not much change in the wound today however he has undermining along the lateral  part of the foot again more extensively than last week. He has some rims of epithelialization. We have been using silver alginate. He is undergoing hyperbarics but did not dive today 12/18; in for his obligatory first total contact cast change. Unfortunately there was pus coming from the undermining area around his fifth metatarsal head. This was cultured but will preclude reapplication of a cast. He is seen in conjunction with HBO 12/24; patient had staph lugdunensis in the wound in the undermining area laterally last time. We put him on doxycycline which should have covered this. The wound looks better today. I am going to give him another week of doxycycline before reattempting the total contact cast 12/31; the patient is completing antibiotics. Hemorrhagic debris in the distal part of the wound with some undermining distally. He also had hyper granulation. Extensive debridement with a #5 curette. The infected area that was on the lateral part of the fifth met head is closed over. I do not think he needs any more antibiotics. Patient was seen prior to HBO. Preparations for a total contact cast were made in the cast will be placed post hyperbarics 04/11/19; once again the patient arrives today without complaint. He had been in a cast all week noted that he had heavy drainage this week. This resulted in large raised areas of macerated tissue around the wound 1/14; wound bed looks better slightly smaller. Hydrofera Blue has been changing himself. He  had a heavy drainage last week which caused a lot of maceration around the wound so I took him out of a total contact cast he says the drainage is actually better this week He is seen today in conjunction with HBO 1/21; returns to clinic. He was up in [place] for a day or 2 attending a funeral. He comes back in with the wound larger and with a large area of exposed bone. He had osteomyelitis and septic arthritis of the fifth left metatarsal head while he  was in hospital. He received IV antibiotics in the hospital for a prolonged period of time then 3 weeks of Augmentin. Subsequently I gave him 2 weeks of doxycycline for more superficial wound infection. When I saw this last week the wound was smaller the surface of the wound looks satisfactory. 1/28; patient missed hyperbarics today. Bone biopsy I did last time showed Enterococcus faecalis and Staphylococcus lugdunensis . He has a wide area of exposed bone. We are going to use silver alginate as of today. I had another ethical discussion with the patient. 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 In this situation I think the likelihood of healing this is low. Therefore I have recommended a ray amputation and with the patient's agreement I have referred him to Dr. Doran Durand. The other issue is that his compliance with hyperbarics has been minimal because of his work schedule and given his underlying decision I am going to stop this today READMISSION 10/24/2019 MRI 09/29/2019 left foot IMPRESSION: 1. Apparent skin ulceration inferior and lateral to the 5th metatarsal base with underlying heterogeneous T2 signal and enhancement in the subcutaneous fat. Small peripherally enhancing fluid collections along the plantar and lateral aspects of the 5th metatarsal base suspicious for abscesses. 2. Interval amputation through the mid 5th metatarsal with nonspecific low-level marrow edema and enhancement. Given the proximity to the adjacent soft tissue inflammatory changes, osteomyelitis cannot be excluded. 3. The additional bones appear unremarkable. MRI 09/29/2019 right foot IMPRESSION: 1. Soft tissue ulceration lateral to the 5th MTP joint. There is low-level T2 hyperintensity within the 4th and 5th metatarsal heads and adjacent proximal phalanges without abnormal T1 signal or cortical destruction. These findings are nonspecific and could be seen with early marrow  edema, hyperemia or early osteomyelitis. No evidence of septic joint. 2. Mild tenosynovitis and synovial enhancement associated with the extensor digitorum tendons at the level of the midfoot. 3. Diffuse low-level muscular T2 hyperintensity and enhancement, most consistent with diabetic myopathy. LEFT FOOT BONE Methicillin resistant staphylococcus aureus Staphylococcus lugdunensis MIC MIC CIPROFLOXACIN >=8 RESISTANT Resistant <=0.5 SENSI... Sensitive CLINDAMYCIN <=0.25 SENS... Sensitive >=8 RESISTANT Resistant ERYTHROMYCIN >=8 RESISTANT Resistant >=8 RESISTANT Resistant GENTAMICIN <=0.5 SENSI... Sensitive <=0.5 SENSI... Sensitive Inducible Clindamycin NEGATIVE Sensitive NEGATIVE Sensitive OXACILLIN >=4 RESISTANT Resistant 2 SENSITIVE Sensitive RIFAMPIN <=0.5 SENSI... Sensitive <=0.5 SENSI... Sensitive TETRACYCLINE <=1 SENSITIVE Sensitive <=1 SENSITIVE Sensitive TRIMETH/SULFA <=10 SENSIT Sensitive <=10 SENSIT Sensitive ... Marland Kitchen.. VANCOMYCIN 1 SENSITIVE Sensitive <=0.5 SENSI... Sensitive Right foot bone . Component 3 wk ago Specimen Description BONE Special Requests RIGHT 4 METATARSAL SAMPLE B Gram Stain NO WBC SEEN NO ORGANISMS SEEN Culture RARE METHICILLIN RESISTANT STAPHYLOCOCCUS AUREUS NO ANAEROBES ISOLATED Performed at [facility], [place] [address] [place], [place] [address] Report Status 10/08/2019 FINAL Organism ID, Bacteria METHICILLIN RESISTANT STAPHYLOCOCCUS AUREUS Resulting Agency [facility] Susceptibility Methicillin resistant staphylococcus aureus MIC CIPROFLOXACIN >=8 RESISTANT Resistant CLINDAMYCIN <=0.25 SENS... Sensitive ERYTHROMYCIN >=8 RESISTANT Resistant GENTAMICIN <=0.5 SENSI... Sensitive Inducible  Clindamycin NEGATIVE Sensitive OXACILLIN >=4 RESISTANT Resistant RIFAMPIN <=0.5 SENSI... Sensitive TETRACYCLINE <=1 SENSITIVE Sensitive TRIMETH/SULFA <=10 SENSIT Sensitive ... VANCOMYCIN 1 SENSITIVE Sensitive This is a patient we had in clinic earlier  this year with a wound over his left fifth metatarsal head. 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 Doran Durand on 05/16/2019. He seems to have developed problems with wounds on his bilateral feet in June 2021 although he may have had problems earlier than this. He was in an urgent care with a right foot ulcer on 09/26/2019 and given a course of doxycycline. This was apparently after having trouble getting into see orthopedics. He was seen by podiatry on 09/28/2019 noted to have bilateral lower extremity ulcers including the left lateral fifth metatarsal base and the right subfifth met head. It was noted that had purulent drainage at that time. He required hospitalization from 6/20 through 7/2. This was because of worsening right foot wounds. He underwent bilateral operative incision and drainage and bone biopsies bilaterally. Culture results are listed above. He has been referred back to clinic by Dr. Jacqualyn Posey of podiatry. He is also followed by Dr. Megan Salon who saw him yesterday. He was discharged from hospital on Zyvox Flagyl and Levaquin and yesterday changed to doxycycline Flagyl and Levaquin. His inflammatory markers on 6/26 showed a sedimentation rate of 129 and a C-reactive protein of 5. This is improved to 14 and 1.3 respectively. This would indicate improvement. ABIs in our clinic today were 1.23 on the right and 1.20 on the left 11/01/2019 on evaluation today patient appears to be doing fairly well in regard to the wounds on his feet at this point. Fortunately there is no signs of active infection at this time. No fevers, chills, nausea, vomiting, or diarrhea. He currently is seeing infectious disease and still under  their care at this point. Subsequently he also has both wounds which she has not been using collagen on as he did not receive that in his packaging he did not call us and let us know that. Apparently that just was missed on the order. Nonetheless we will get that straightened out today. 8/9-Patient returns for bilateral foot wounds, using Prisma with hydrogel moistened dressings, and the wounds appear stable. Patient using surgical shoes, avoiding much pressure or weightbearing as much as possible 8/16; patient has bilateral foot wounds. 1 on the right lateral foot proximally the other is on the left mid lateral foot. Both required debridement of callus and thick skin around the wounds. We have been using silver collagen 8/27; patient has bilateral lateral foot wounds. The area on the left substantially surrounded by callus and dry skin. This was removed from the wound edge. The underlying wound is small. The area on the right measured somewhat smaller today. 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 Unfortunately I did not update his antibiotics during today's visit. 9/10 I reviewed Dr. Hale Bogus last notes he felt he had completed antibiotics his inflammatory markers were reasonably well controlled. He has a small wound on the lateral left foot and a tiny area on the right which is just above closed. He is using Hydrofera Blue with border foam he has bilateral surgical shoes  9/24; 2 week f/u. doing well. right foot is closed. left foot still undermined. 10/14; right foot remains closed at the fifth met head. The area over the base of the left fifth metatarsal has a small open area but considerable undermining towards the plantar foot. Thick callus skin around this suggests an adequate pressure relief. We have talked about this. He says he is going to go back into his cam boot. I suggested a total contact cast he did not seem  enamored with this suggestion 10/26; left foot base of the fifth metatarsal. Same condition as last time. He has skin over the area with an open wound however the skin is not adherent. He went to see Dr. Earleen Newport who did an x-ray and culture of his foot I have not reviewed the x-ray but the patient was not told anything. He is on doxycycline 11/11; since the patient was last here he was in the emergency room on 10/30 he was concerned about swelling in the left foot. They did not do any cultures or x-rays. They changed his antibiotics to cephalexin. Previous culture showed group B strep. The cephalexin is appropriate as doxycycline has less than predictable coverage. Arrives in clinic today with swelling over this area under the wound. He also has a new wound on the right fifth metatarsal head 11/18; the patient has a difficult wound on the lateral aspect of the left fifth metatarsal head. The wound was almost ballotable last week I opened it slightly expecting to see purulence however there was just bleeding. I cultured this this was negative. X-ray unchanged. We are trying to get an MRI but I am not sure were going to be able to get this through his insurance. He also has an area on the right lateral fifth metatarsal head this looks healthier 12/3; the patient finally got our MRI. Surprisingly this did not show osteomyelitis. I did show the soft tissue ulceration at the lateral plantar aspect of the fifth metatarsal base with a tiny residual 6 mm abscess overlying the superficial fascia I have tried to culture this area I have not been able to get this to grow anything. Nevertheless the protruding tissue looks aggravated. I suspect we should try to treat the underlying "abscess with broad-spectrum antibiotics. I am going to start him on Levaquin and Flagyl. He has much less edema in his legs and I am going to continue to wrap his legs and see him weekly 12/10. I started Levaquin and Flagyl on him last  week. He just picked up the Flagyl apparently there was some delay. The worry is the wound on the left fifth metatarsal base which is substantial and worsening. His foot looks like he inverts at the ankle making this a weightbearing surface. Certainly no improvement in fact I think the measurements of this are somewhat worse. We have been using 12/17; he apparently just got the Levaquin yesterday this is 2 weeks after the fact. He has completed the Flagyl. The area over the left fifth metatarsal base still has protruding granulation tissue although it does not look quite as bad as it did some weeks ago. He has severe bilateral lymphedema although we have not been treating him for wounds on his legs this is definitely going to require compression. There was so much edema in the left I did not wish to put him in a total contact cast today. I am going to increase his compression from 3-4 layer. The area on the right lateral fifth  met head actually look quite good and superficial. 12/23; patient arrived with callus on the right fifth met head and the substantial hyper granulated callused wound on the base of his fifth metatarsal. He says he is completing his Levaquin in 2 days but I do not think that adds up with what I gave him but I will have to double check this. We are using Hydrofera Blue on both areas. My plan is to put the left leg in a cast the week after New Year's 04/06/2020; patient's wounds about the same. Right lateral fifth metatarsal head and left lateral foot over the base of the fifth metatarsal. There is undermining on the left lateral foot which I removed before application of total contact cast continuing with Hydrofera Blue new. Patient tells me he was seen by endocrinology today lab work was done [Dr. Kerr]. Also wondering whether he was referred to cardiology. I went over some lab work from previously does not have chronic renal failure certainly not nephrotic range proteinuria he does  have very poorly controlled diabetes but this is not his most updated lab work. Hemoglobin A1c has been over 11 1/10; the patient had a considerable amount of leakage towards mid part of his left foot with macerated skin however the wound surface looks better the area on the right lateral fifth met head is better as well. I am going to change the dressing on the left foot under the total contact cast to silver alginate, continue with Hydrofera Blue on the right. 1/20; patient was in the total contact cast for 10 days. Considerable amount of drainage although the skin around the wound does not look too bad on the left foot. The area on the right fifth metatarsal head is closed. Our nursing staff reports large amount of drainage out of the left lateral foot wound 1/25; continues with copious amounts of drainage described by our intake staff. PCR culture I did last week showed E. coli and Enterococcus faecalis and low quantities. Multiple resistance genes documented including extended spectrum beta lactamase, MRSA, MRSE, quinolone, tetracycline. The wound is not quite as good this week as it was 5 days ago but about the same size 2/3; continues with copious amounts of malodorous drainage per our intake nurse. The PCR culture I did 2 weeks ago showed E. coli and low quantities of Enterococcus. There were multiple resistance genes detected. I put Neosporin on him last week although this does not seem to have helped. The wound is slightly deeper today. Offloading continues to be an issue here although with the amount of drainage she has a total contact cast is just not going to work 2/10; moderate amount of drainage. Patient reports he cannot get his stocking on over the dressing. I told him we have to do that the nurse gave him suggestions on how to make this work. The wound is on the bottom and lateral part of his left foot. Is cultured predominantly grew low amounts of Enterococcus, E. coli and  anaerobes. There were multiple resistance genes detected including extended spectrum beta lactamase, quinolone, tetracycline. I could not think of an easy oral combination to address this so for now I am going to do topical antibiotics provided by [facility] I think the main agents here are vancomycin and an aminoglycoside. We have to be able to give him access to the wounds to get the topical antibiotic on 2/17; moderate amount of drainage this is unchanged. He has his Keystone topical antibiotic against the  deep tissue culture organisms. He has been using this and changing the dressing daily. Silver alginate on the wound surface. 2/24; using Keystone antibiotic with silver alginate on the top. He had too much drainage for a total contact cast at one point although I think that is improving and I think in the next week or 2 it might be possible to replace a total contact cast I did not do this today. In general the wound surface looks healthy however he continues to have thick rims of skin and subcutaneous tissue around the wide area of the circumference which I debrided 06/04/2020 upon evaluation today patient appears to be doing well in regard to his wound. I do feel like he is showing signs of improvement. There is little bit of callus and dead tissue around the edges of the wound as well as what appears to be a little bit of a sinus tract that is off to the side laterally I would perform debridement to clear that away today. 3/17; left lateral foot. The wound looks about the same as I remember. Not much depth surface looks healthy. No evidence of infection 3/25; left lateral foot. Wound surface looks about the same. Separating epithelium from the circumference. There really is no evidence of infection here however not making progress by my view 3/29; left lateral foot. Surface of the wound again looks reasonably healthy still thick skin and subcutaneous tissue around the wound margins. There is  no evidence of infection. One of the concerns being brought up by the nurses has again the amount of drainage vis--vis continued use of a total contact cast 4/5; left lateral foot at roughly the base of the fifth metatarsal. Nice healthy looking granulated tissue with rims of epithelialization. The overall wound measurements are not any better but the tissue looks healthy. The only concern is the amount of drainage although he has no surrounding maceration with what we have been doing recently to absorb fluid and protect his skin. He also has lymphedema. 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 He states that the kerlix/coban under the TCC rolled down and could not get it back up. He has been using an offloading scooter and has somehow hurt his right foot using this device. This happened last week. He states that the side of his right foot developed a blister and opened. The top of his foot also has a few small open wounds he thinks is due to his socks rubbing in his shoes. He has not been using any dressings to the wound. He denies purulent drainage, fever/chills or erythema to the wounds. 4/22; patient presents for 1 week follow-up. He developed new wounds to the right foot that were evaluated at last clinic visit. He continues to have a total contact cast to the left leg and he reports no issues. He has been using silver collagen to the right foot wounds with no issues. He denies purulent drainage, fever/chills or erythema to the right foot wounds. He has no complaints today 4/25; patient presents for 1 week follow-up. He has a total contact cast of the left leg and reports no issues. He has been using silver alginate to the right foot wound. He  denies purulent drainage,  fever/chills or erythema to the right foot wounds. 5/2 patient presents for 1 week follow-up. T contact cast on the left. The wound which is on the base of the plantar foot at the base of the fifth metatarsal otal actually looks quite good and dimensions continue to gradually contract. HOWEVER the area on the right lateral fifth metatarsal head is much larger than what I remember from 2 weeks ago. Once more is he has significant levels of hypergranulation. Noteworthy that he had this same hyper granulated response on his wound on the left foot at one point in time. So much so that he I thought there was an underlying fluid collection. Based on this I think this just needs debridement. 5/9; the wound on the left actually continues to be gradually smaller with a healthy surface. Slight amount of drainage and maceration of the skin around but not too bad. However he has a large wound over the right fifth metatarsal head very much in the same configuration as his left foot wound was initially. I used silver nitrate to address the hyper granulated tissue no mechanical debridement 5/16; area on the left foot did not look as healthy this week deeper thick surrounding macerated skin and subcutaneous tissue. oo The area on the right foot fifth met head was about the same oo The area on the right ankle that we identified last week is completely broken down into an open wound presumably a stocking rubbing issue 5/23; patient has been using a total contact cast to the left side. He has been using silver alginate underneath. He has also been using silver alginate to the right foot wounds. He has no complaints today. He denies any signs of infection. 5/31; the left-sided wound looks some better measure smaller surface granulation looks better. We have been using silver alginate under the total contact cast oo The large area on his right fifth met head and right dorsal foot look about  the same still using silver alginate 6/6; neither side is good as I was hoping although the surface area dimensions are better. A lot of maceration on his left and right foot around the wound edge. Area on the dorsal right foot looks better. He says he was traveling. I am not sure what does the amount of maceration around the plantar wounds may be drainage issues 6/13; in general the wound surfaces look quite good on both sides. Macerated skin and raised edges around the wound required debridement although in general especially on the left the surface area seems improved. oo The area on the right dorsal ankle is about the same I thought this would not be such a problem to close 6/20; not much change in either wound although the one on the right looks a little better. Both wounds have thick macerated edges to the skin requiring debridements. We have been using silver alginate. The area on the dorsal right ankle is still open I thought this would be closed. 6/28; patient comes in today with a marked deterioration in the right foot wound fifth met head. Wide area of exposed bone this is a drastic change from last time. The area on the left there we have been casting is stagnant. We have been using silver alginate in both wound areas. 7/5; bone culture I did for PCR last time was positive for Pseudomonas, group B strep, Enterococcus and Staph aureus. There was no suggestion of methicillin resistance or ampicillin resistant genes. This was resistant to tetracycline however He comes  into the clinic today with the area over his right plantar fifth metatarsal head which had been doing so well 2 weeks ago completely necrotic feeling bone. I do not know that this is going to be salvageable. The left foot wound is certainly no smaller but it has a better surface and is superficial. 7/8; patient called in this morning to say that his total contact cast was rubbing against his foot. He states he is doing fine  overall. He denies signs of infection. 7/12; continued deterioration in the wound over the right fifth metatarsal head crumbling bone. This is not going to be salvageable. The patient agrees and wants to be referred to Dr. Doran Durand which we will attempt to arrange as soon as possible. I am going to continue him on antibiotics as long as that takes so I will renew those today. The area on the left foot which is the base of the fifth metatarsal continues to look somewhat better. Healthy looking tissue no depth no debridement is necessary here. 7/20; the patient was kindly seen by Dr. Doran Durand of orthopedics on 10/19/2020. 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 Towards this end we have taken him out of the total contact cast on the left we will put him in a wrap with Hydrofera Blue. As I understand things surgery is planned for 7/21 7/27; patient had his surgery last Thursday. 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 Apparently everything went well we did not still disturb that today The area on the left foot actually looks quite good. He has been much less mobile which probably explains this he did not seem to do well in the total contact cast secondary to drainage and maceration I think. 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 Fortunately there is no evidence of active infection at this time. No fevers, chills, nausea, vomiting, or diarrhea. Overall I think that he is actually doing extremely well. Nonetheless I do believe that he is staying off of this more following the surgery in his right foot that is the reason the left is doing so great. 8/16; left plantar foot wound. This looks smaller than the last time I saw this he is using Hydrofera Blue. The surgical wound on the right foot is being  followed by Dr. Doran Durand we did not look at this today. He has surgical shoes on both feet 8/23; left plantar foot wound not as good this week. Surrounding macerated skin and subcutaneous tissue everything looks moist and wet. I do not think he is offloading this adequately. He is using a surgical shoe Apparently the right foot surgical wound is not open although I did not check his foot 8/31; left plantar foot lateral aspect. Much improved this week. He has no maceration. Some improvement in the surface area of the wound but most impressively the depth is come in we are using silver alginate. The patient is a Product/process development scientist. He is asked that we write him a letter so he can go back to work. I have also tried to see if we can write something that will allow him to limit the amount of time that he is on his foot at work. Right now he tells me his classrooms are next door to each  other however he has to supervise lunch which is well across. Hopefully the latter can be avoided 9/6; I believe the patient missed an appointment last week. He arrives in today with a wound looking roughly the same certainly no better. Undermining laterally and also inferiorly. We used molecuLight today in training with the patient's permission.. We are using silver alginate 9/21 wound is measuring bigger this week although this may have to do with the aggressive circumferential debridement last week in response to the blush fluorescence on the MolecuLight. Culture I did last week showed significant MSSA and E. coli. I put him on Augmentin but he has not started it yet. We are also going to send this for compounded antibiotics at [facility]. There is no evidence of systemic infection 9/29; silver alginate. His Keystone arrived. He is completing Augmentin in 2 days. Offloading in a cam boot. Moderate drainage per our intake staff 10/5; using silver alginate. He has been using his [place]. He has completed his Augmentin.  Per our intake nurse still a lot of drainage, far too much to consider a total contact cast. Wound measures about the same. He had the same undermining area that I defined last week from a roughly 11-3. I remove this today 10/12; using silver alginate he is using the [place]. He comes in for a nurse visit hence we are applying [employer] twice a week. Measuring slightly better today and less notable drainage. Extensive debridement of the wound edge last time Objective Constitutional Sitting or standing Blood Pressure is within target range for patient.. Pulse regular and within target range for patient.Marland Kitchen Respirations regular, non-labored and within target range.. Temperature is normal and within the target range for the patient.Marland Kitchen Appears in no distress. Vitals Time Taken: 7:52 AM, Height: 77 in, Weight: 280 lbs, BMI: 33.2, Temperature: 98.7 F, Pulse: 107 bpm, Respiratory Rate: 16 breaths/min, Blood Pressure: 133/76 mmHg, Capillary Blood Glucose: 130 mg/dl. General Notes: Wound exam; left plantar foot base of the fifth metatarsal. Apparently measuring slightly better in terms of surface area. Wound bed also looks better. He still has the thick raised edges of the circumference although not as bad as it has been and no undermining which is an improvement. As noted the drainage was better. No surrounding erythema no obvious active infection Integumentary (Hair, Skin) Wound #3 status is Open. Original cause of wound was Trauma. The date acquired was: 10/02/2019. The wound has been in treatment 63 weeks. The wound is located on the Left,Lateral Foot. The wound measures 3.6cm length x 3.5cm width x 0.4cm depth; 9.896cm^2 area and 3.958cm^3 volume. There is Fat Layer (Subcutaneous Tissue) exposed. There is no tunneling or undermining noted. There is a medium amount of serosanguineous drainage noted. The wound margin is distinct with the outline attached to the wound base. There is large (67-100%) red,  pink granulation within the wound bed. There is a small (1-33%) amount of necrotic tissue within the wound bed. 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 - with Dr. Dellia Nims early next week Nurse Visit: - Friday 10/14 to change soft cast Edema Control - Lymphedema / SCD / Other: Elevate legs to the level of the heart or above for 30 minutes daily and/or when sitting, a frequency of: - throughout the day Avoid standing for long periods of time. Exercise regularly Moisturize legs  daily. - right leg every night before bed. Compression stocking or Garment 20-30 mm/Hg pressure to: - Apply to right leg in the morning and remove at night. Off-Loading: Open toe surgical shoe to: - Left Foot with soft cast Other: - minimal weight bearing left foot Additional Orders / Instructions: Follow Nutritious Diet WOUND #3: - Foot Wound Laterality: Left, Lateral Cleanser: Soap and Water 2 x Per Week/30 Days Discharge Instructions: May shower and wash wound with dial antibacterial soap and water prior to dressing change. Cleanser: Wound Cleanser (Generic) 2 x Per Week/30 Days Discharge Instructions: Cleanse the wound with wound cleanser prior to applying a clean dressing using gauze sponges, not tissue or cotton balls. Peri-Wound Care: Zinc Oxide Ointment 30g tube 2 x Per Week/30 Days Discharge Instructions: Apply Zinc Oxide to periwound with each dressing change as needed. Topical: keystone antibiotic compound 2 x Per Week/30 Days Discharge Instructions: thin layer to wound bed Prim Dressing: KerraCel Ag Gelling Fiber Dressing, 4x5 in (silver alginate) (Generic) 2 x Per Week/30 Days ary Discharge Instructions: Apply silver alginate to wound bed as instructed Secondary Dressing: Woven Gauze  Sponge, Non-Sterile 4x4 in (Generic) 2 x Per Week/30 Days Discharge Instructions: Apply over primary dressing as directed. Secondary Dressing: Zetuvit Plus 4x8 in (Generic) 2 x Per Week/30 Days Discharge Instructions: Apply over primary dressing as directed. Secondary Dressing: Optifoam Non-Adhesive Dressing, 4x4 in (Generic) 2 x Per Week/30 Days Discharge Instructions: Apply over primary dressing as directed. Secured With: Coban Self-Adherent Wrap 4x5 (in/yd) (Generic) 2 x Per Week/30 Days Discharge Instructions: Secure with Coban as directed. Secured With: [employer], 4.5x3.1 (in/yd) (Generic) 2 x Per Week/30 Days Discharge Instructions: Secure with Kerlix as directed. Secured With: 60M Medipore H Soft Cloth Surgical T 4 x 2 (in/yd) (Generic) 2 x Per Week/30 Days ape Discharge Instructions: Secure dressing with tape as directed. Add-Ons: Soft Cast 2 x Per Week/30 Days #1 I have continued with the silver alginate's,Zetuvit. Soft cast. As much support as we can give him. 2. Still coming in for nurse visit we are carefully monitoring the drainage and hoping at some point I can go back to a total contact cast. 3. We need to continue the nurse visits in order to get at least twice a week topical Keystone 4. I see no evidence of infection and no need for systemic antibiotics at this point. Also no need for imaging. 5. I did not do a MolecuLight today I will consider this again next time based on the size and condition of the wound drainage etc. Electronic Signature(s) Signed: 01/13/2021 4:37:06 PM By: [name] MD Entered By: [name] on 01/13/2021 08:34:23 -------------------------------------------------------------------------------- SuperBill Details Patient Name: Date of Service: [name], [name] 01/13/2021 Medical Record Number: [id number] Patient Account Number: [id number] Date of Birth/Sex: Treating RN: 1985-07-06 (35 y.o. [name] Primary Care  Provider: [name] Other Clinician: Referring Provider: Treating Provider/Extender: [name] in Treatment: 63 Diagnosis Coding ICD-10 Codes Code Description E11.621 Type 2 diabetes mellitus with foot ulcer L97.528 Non-pressure chronic ulcer of other part of left foot with other specified severity Facility Procedures CPT4 Code: [id number] Description: 99213 - WOUND CARE VISIT-LEV 3 EST PT Modifier: Quantity: 1 Physician Procedures : CPT4 Code Description Modifier [id number] - WC PHYS LEVEL 4 - EST PT ICD-10 Diagnosis Description E11.621 Type 2 diabetes mellitus with foot ulcer L97.528 Non-pressure chronic ulcer of other part of left foot with other specified severity Quantity: 1 Electronic Signature(s)  Signed: 01/13/2021 4:37:06 PM By: [name] MD Signed: 01/13/2021 4:54:33 PM By: [name] Entered By: [name] on 01/13/2021 08:39:25

## 2021-01-14 NOTE — Progress Notes (Signed)
J[place], [name] ([id number]) Visit Report for 01/13/2021 Arrival Information Details Patient Name: Date of Service: [name] 01/13/2021 7:30 A M Medical Record Number: [id number] Patient Account Number: [id number] Date of Birth/Sex: Treating RN: October 17, 1985 (35 y.o. [name] Primary Care Raima Geathers: [name] Other Clinician: Referring Rivaan Kendall: Treating Verl Kitson/Extender: [name] in Treatment: 74 Visit Information History Since Last Visit Added or deleted any medications: No Patient Arrived: Ambulatory Any new allergies or adverse reactions: No Arrival Time: 07:50 Had a fall or experienced change in No Transfer Assistance: None activities of daily living that may affect Patient Identification Verified: Yes risk of falls: Secondary Verification Process Completed: Yes Signs or symptoms of abuse/neglect since last visito No Patient Requires Transmission-Based Precautions: No Hospitalized since last visit: No Patient Has Alerts: No Implantable device outside of the clinic excluding No cellular tissue based products placed in the center since last visit: Has Dressing in Place as Prescribed: Yes Pain Present Now: No Electronic Signature(s) Signed: 01/13/2021 4:54:33 PM By: [name] Entered By: [name] on 01/13/2021 07:51:17 -------------------------------------------------------------------------------- Clinic Level of Care Assessment Details Patient Name: Date of Service: [name] 01/13/2021 7:30 A M Medical Record Number: [id number] Patient Account Number: [id number] Date of Birth/Sex: Treating RN: March 27, 1986 (35 y.o. [name] Primary Care Skyelyn Scruggs: [name] Other Clinician: Referring Jonathon Tan: Treating Cele Mote/Extender: [name] in Treatment: 63 Clinic Level of Care Assessment Items TOOL 4 Quantity Score X- 1 0 Use when only an EandM is performed on  FOLLOW-UP visit ASSESSMENTS - Nursing Assessment / Reassessment X- 1 10 Reassessment of Co-morbidities (includes updates in patient status) X- 1 5 Reassessment of Adherence to Treatment Plan ASSESSMENTS - Wound and Skin A ssessment / Reassessment X - Simple Wound Assessment / Reassessment - one wound 1 5 _0  - 0 Complex Wound Assessment / Reassessment - multiple wounds _1  - 0 Dermatologic / Skin Assessment (not related to wound area) ASSESSMENTS - Focused Assessment _2  - 0 Circumferential Edema Measurements - multi extremities _3  - 0 Nutritional Assessment / Counseling / Intervention _4  - 0 Lower Extremity Assessment (monofilament, tuning fork, pulses) _5  - 0 Peripheral Arterial Disease Assessment (using hand held doppler) ASSESSMENTS - Ostomy and/or Continence Assessment and Care _6  - 0 Incontinence Assessment and Management _7  - 0 Ostomy Care Assessment and Management (repouching, etc.) PROCESS - Coordination of Care _8  - 0 Simple Patient / Family Education for ongoing care X- 1 20 Complex (extensive) Patient / Family Education for ongoing care _9  - 0 Staff obtains Programmer, systems, Records, T Results / Process Orders est _10  - 0 Staff telephones HHA, Nursing Homes / Clarify orders / etc _11  - 0 Routine Transfer to another Facility (non-emergent condition) _12  - 0 Routine Hospital Admission (non-emergent condition) _13  - 0 New Admissions / Biomedical engineer / Ordering NPWT Apligraf, etc. , _14  - 0 Emergency Hospital Admission (emergent condition) _15  - 0 Simple Discharge Coordination _16  - 0 Complex (extensive) Discharge Coordination PROCESS - Special Needs _17  - 0 Pediatric / Minor Patient Management _18  - 0 Isolation Patient Management _19  - 0 Hearing / Language / Visual special needs _20  - 0 Assessment of Community assistance (transportation, D/C planning, etc.) _21  - 0 Additional assistance / Altered mentation _22  - 0 Support Surface(s) Assessment (bed, cushion,  seat, etc.) INTERVENTIONS - Wound Cleansing / Measurement X - Simple Wound Cleansing - one wound 1 5 _23  - 0 Complex Wound Cleansing - multiple wounds X- 1 5  Wound Imaging (photographs - any number of wounds) _0  - 0 Wound Tracing (instead of photographs) X- 1 5 Simple Wound Measurement - one wound _1  - 0 Complex Wound Measurement - multiple wounds INTERVENTIONS - Wound Dressings _2  - 0 Small Wound Dressing one or multiple wounds _3  - 0 Medium Wound Dressing one or multiple wounds X- 1 20 Large Wound Dressing one or multiple wounds <ALPFXTKWIOXBDZHG>_9<\/JMEQASTMHDQQIWLN>_9  - 0 Application of Medications - topical <GXQJJHERDEYCXKGY>_1<\/EHUDJSHFWYOVZCHY>_8  - 0 Application of Medications - injection INTERVENTIONS - Miscellaneous _6  - 0 External ear exam _7  - 0 Specimen Collection (cultures, biopsies, blood, body fluids, etc.) _8  - 0 Specimen(s) / Culture(s) sent or taken to Lab for analysis _9  - 0 Patient Transfer (multiple staff / Civil Service fast streamer / Similar devices) _10  - 0 Simple Staple / Suture removal (25 or less) _11  - 0 Complex Staple / Suture removal (26 or more) _12  - 0 Hypo / Hyperglycemic Management (close monitor of Blood Glucose) _13  - 0 Ankle / Brachial Index (ABI) - do not check if billed separately X- 1 5 Vital Signs Has the patient been seen at the hospital within the last three years: Yes Total Score: 80 Level Of Care: New/Established - Level 3 Electronic Signature(s) Signed: 01/13/2021 4:54:33 PM By: [name] Entered By: [name] on 01/13/2021 08:39:12 -------------------------------------------------------------------------------- Encounter Discharge Information Details Patient Name: Date of Service: [name], [name] 01/13/2021 7:30 A M Medical Record Number: [id number] Patient Account Number: [id number] Date of Birth/Sex: Treating RN: March 28, 1986 (35 y.o. [name] Primary Care Paysley Poplar: [name] Other Clinician: Referring Polina Burmaster: Treating Takira Sherrin/Extender: [name] in  Treatment: 61 Encounter Discharge Information Items Discharge Condition: Stable Ambulatory Status: Ambulatory Discharge Destination: Home Transportation: Private Auto Schedule Follow-up Appointment: Yes Clinical Summary of Care: Provided on 01/13/2021 Form Type Recipient Paper Patient Patient Electronic Signature(s) Signed: 01/13/2021 4:54:33 PM By: [name] Entered By: [name] on 01/13/2021 08:40:15 -------------------------------------------------------------------------------- Lower Extremity Assessment Details Patient Name: Date of Service: [name] 01/13/2021 7:30 A M Medical Record Number: [id number] Patient Account Number: [id number] Date of Birth/Sex: Treating RN: 06-13-1985 (35 y.o. [name] Primary Care Amayrany Cafaro: [name] Other Clinician: Referring Donavon Kimrey: Treating Teven Mittman/Extender: [name] in Treatment: 63 Edema Assessment Assessed: [name]: Yes] [name]: No] Edema: [Left: Ye] [Right: s] Calf Left: Right: Point of Measurement: 48 cm From Medial Instep 45.5 cm Ankle Left: Right: Point of Measurement: 11 cm From Medial Instep 30.5 cm Knee To Floor Left: Right: From Medial Instep 46 cm Vascular Assessment Pulses: Dorsalis Pedis Palpable: [Left:Yes] Electronic Signature(s) Signed: 01/13/2021 4:54:33 PM By: [name] Entered By: [name] on 01/13/2021 07:57:49 -------------------------------------------------------------------------------- Multi Wound Chart Details Patient Name: Date of Service: [name], [name] 01/13/2021 7:30 A M Medical Record Number: [id number] Patient Account Number: [id number] Date of Birth/Sex: Treating RN: [birth year] (35 y.o. [name] Primary Care Haelyn Forgey: [name] Other Clinician: Referring Latorria Zeoli: Treating Kammi Hechler/Extender: [name] in Treatment: 63 Vital Signs Height(in): 77 Capillary Blood  Glucose(mg/dl): 130 Weight(lbs): 280 Pulse(bpm): 107 Body Mass Index(BMI): 33 Blood Pressure(mmHg): 133/76 Temperature(F): 98.7 Respiratory Rate(breaths/min): 16 Photos: [N/A:N/A] Left, Lateral Foot N/A N/A Wound Location: Trauma N/A N/A Wounding Event: Diabetic Wound/Ulcer of the Lower N/A N/A Primary Etiology: Extremity Type II Diabetes N/A N/A Comorbid History: 10/02/2019 N/A N/A Date Acquired: 57 N/A N/A Weeks of Treatment: Open N/A N/A Wound Status: 3.6x3.5x0.4 N/A N/A Measurements L x W x D (cm) 9.896 N/A  N/A A (cm) : rea 3.958 N/A N/A Volume (cm) : -500.10% N/A N/A % Reduction in A rea: -2298.80% N/A N/A % Reduction in Volume: Grade 2 N/A N/A Classification: Medium N/A N/A Exudate A mount: Serosanguineous N/A N/A Exudate Type: red, brown N/A N/A Exudate Color: Distinct, outline attached N/A N/A Wound Margin: Large (67-100%) N/A N/A Granulation A mount: Red, Pink N/A N/A Granulation Quality: Small (1-33%) N/A N/A Necrotic A mount: Fat Layer (Subcutaneous Tissue): Yes N/A N/A Exposed Structures: Fascia: No Tendon: No Muscle: No Joint: No Bone: No None N/A N/A Epithelialization: Calloused Periwound N/A N/A Assessment Notes: Treatment Notes Electronic Signature(s) Signed: 01/13/2021 4:37:06 PM By: [name] MD Signed: 01/14/2021 4:58:13 PM By: [name] RN, BSN Entered By: [name] on 01/13/2021 08:29:51 -------------------------------------------------------------------------------- Multi-Disciplinary Care Plan Details Patient Name: Date of Service: [name], [name] 01/13/2021 7:30 A M Medical Record Number: [id number] Patient Account Number: [id number] Date of Birth/Sex: Treating RN: [birth year] (35 y.o. [name] Primary Care Loriana Samad: [name] Other Clinician: Referring Uziel Covault: Treating Moria Brophy/Extender: [name] in Treatment: 63 Multidisciplinary Care Plan reviewed  with physician Active Inactive Nutrition Nursing Diagnoses: Imbalanced nutrition Potential for alteratiion in Nutrition/Potential for imbalanced nutrition Goals: Patient/caregiver agrees to and verbalizes understanding of need to use nutritional supplements and/or vitamins as prescribed Date Initiated: 10/24/2019 Date Inactivated: 04/06/2020 Target Resolution Date: 04/03/2020 Goal Status: Met Patient/caregiver will maintain therapeutic glucose control Date Initiated: 10/24/2019 Target Resolution Date: 01/22/2021 Goal Status: Active Interventions: Assess HgA1c results as ordered upon admission and as needed Assess patient nutrition upon admission and as needed per policy Provide education on elevated blood sugars and impact on wound healing Provide education on nutrition Treatment Activities: Education provided on Nutrition : 12/02/2020 Notes: 11/17/20: Glucose control ongoing issue, target date extended. Wound/Skin Impairment Nursing Diagnoses: Impaired tissue integrity Knowledge deficit related to ulceration/compromised skin integrity Goals: Patient/caregiver will verbalize understanding of skin care regimen Date Initiated: 10/24/2019 Target Resolution Date: 01/22/2021 Goal Status: Active Ulcer/skin breakdown will have a volume reduction of 30% by week 4 Date Initiated: 10/24/2019 Date Inactivated: 01/16/2020 Target Resolution Date: 01/10/2020 Unmet Reason: no change in Goal Status: Unmet measurements. Interventions: Assess patient/caregiver ability to obtain necessary supplies Assess patient/caregiver ability to perform ulcer/skin care regimen upon admission and as needed Assess ulceration(s) every visit Provide education on ulcer and skin care Notes: 11/17/20: Wound care regimen continues Electronic Signature(s) Signed: 01/13/2021 4:54:33 PM By: [name] Entered By: [name] on 01/13/2021  07:53:19 -------------------------------------------------------------------------------- Pain Assessment Details Patient Name: Date of Service: [name] [name] 01/13/2021 7:30 A M Medical Record Number: [id number] Patient Account Number: [id number] Date of Birth/Sex: Treating RN: December 28, 1985 (35 y.o. [name] Primary Care Aulton Routt: [name] Other Clinician: Referring Leighla Chestnutt: Treating Aryannah Mohon/Extender: [name] in Treatment: 63 Active Problems Location of Pain Severity and Description of Pain Patient Has Paino No Site Locations Pain Management and Medication Current Pain Management: Electronic Signature(s) Signed: 01/13/2021 4:54:33 PM By: [name] Entered By: [name] on 01/13/2021 07:52:29 -------------------------------------------------------------------------------- Patient/Caregiver Education Details Patient Name: Date of Service: [name], [name] 10/12/2022andnbsp7:30 A M Medical Record Number: [id number] Patient Account Number: [id number] Date of Birth/Gender: Treating RN: [birth year] (35 y.o. [name] Primary Care Physician: [name] Other Clinician: Referring Physician: Treating Physician/Extender: [name] in Treatment: 79 Education Assessment Education Provided To: Patient Education Topics Provided Elevated Blood Sugar/ Impact on Healing: Methods: Explain/Verbal Responses: State content correctly  Offloading: Methods: Explain/Verbal, Printed Responses: State content correctly Wound/Skin Impairment: Methods: Explain/Verbal, Printed Responses: State content correctly Electronic Signature(s) Signed: 01/13/2021 4:54:33 PM By: [name] Entered By: [name] on 01/13/2021 07:54:02 -------------------------------------------------------------------------------- Wound Assessment Details Patient Name: Date of Service: [name], [name]  01/13/2021 7:30 A M Medical Record Number: [id number] Patient Account Number: [id number] Date of Birth/Sex: Treating RN: May 05, 1985 (35 y.o. [name] Primary Care Fernando Stoiber: [name] Other Clinician: Referring Jobie Popp: Treating Ayse Mccartin/Extender: [name] in Treatment: 63 Wound Status Wound Number: 3 Primary Etiology: Diabetic Wound/Ulcer of the Lower Extremity Wound Location: Left, Lateral Foot Wound Status: Open Wounding Event: Trauma Comorbid History: Type II Diabetes Date Acquired: 10/02/2019 Weeks Of Treatment: 63 Clustered Wound: No Photos Wound Measurements Length: (cm) 3.6 Width: (cm) 3.5 Depth: (cm) 0.4 Area: (cm) 9.896 Volume: (cm) 3.958 % Reduction in Area: -500.1% % Reduction in Volume: -2298.8% Epithelialization: None Tunneling: No Undermining: No Wound Description Classification: Grade 2 Wound Margin: Distinct, outline attached Exudate Amount: Medium Exudate Type: Serosanguineous Exudate Color: red, brown Foul Odor After Cleansing: No Slough/Fibrino Yes Wound Bed Granulation Amount: Large (67-100%) Exposed Structure Granulation Quality: Red, Pink Fascia Exposed: No Necrotic Amount: Small (1-33%) Fat Layer (Subcutaneous Tissue) Exposed: Yes Tendon Exposed: No Muscle Exposed: No Joint Exposed: No Bone Exposed: No Assessment Notes Calloused Periwound Treatment Notes Wound #3 (Foot) Wound Laterality: Left, Lateral Cleanser Soap and Water Discharge Instruction: May shower and wash wound with dial antibacterial soap and water prior to dressing change. Wound Cleanser Discharge Instruction: Cleanse the wound with wound cleanser prior to applying a clean dressing using gauze sponges, not tissue or cotton balls. Peri-Wound Care Zinc Oxide Ointment 30g tube Discharge Instruction: Apply Zinc Oxide to periwound with each dressing change as needed. Topical keystone antibiotic compound Discharge Instruction:  thin layer to wound bed Primary Dressing KerraCel Ag Gelling Fiber Dressing, 4x5 in (silver alginate) Discharge Instruction: Apply silver alginate to wound bed as instructed Secondary Dressing Woven Gauze Sponge, Non-Sterile 4x4 in Discharge Instruction: Apply over primary dressing as directed. Zetuvit Plus 4x8 in Discharge Instruction: Apply over primary dressing as directed. Optifoam Non-Adhesive Dressing, 4x4 in Discharge Instruction: Apply over primary dressing as directed. Secured With [employer] 4x5 (in/yd) Discharge Instruction: Secure with Coban as directed. Kerlix Roll Sterile, 4.5x3.1 (in/yd) Discharge Instruction: Secure with Kerlix as directed. 42M Medipore H Soft Cloth Surgical T 4 x 2 (in/yd) ape Discharge Instruction: Secure dressing with tape as directed. Compression Wrap Compression Stockings Add-Ons Soft Cast Electronic Signature(s) Signed: 01/13/2021 1:46:44 PM By: Sandre Kitty Signed: 01/13/2021 4:54:33 PM By: [name] Entered By: Sandre Kitty on 01/13/2021 07:57:53 -------------------------------------------------------------------------------- Vitals Details Patient Name: Date of Service: [name], [name] 01/13/2021 7:30 A M Medical Record Number: [id number] Patient Account Number: [id number] Date of Birth/Sex: Treating RN: [birth year] (35 y.o. [name] Primary Care Ronak Duquette: [name] Other Clinician: Referring Ellina Sivertsen: Treating Danee Soller/Extender: [name] in Treatment: 63 Vital Signs Time Taken: 07:52 Temperature (F): 98.7 Height (in): 77 Pulse (bpm): 107 Weight (lbs): 280 Respiratory Rate (breaths/min): 16 Body Mass Index (BMI): 33.2 Blood Pressure (mmHg): 133/76 Capillary Blood Glucose (mg/dl): 130 Reference Range: 80 - 120 mg / dl Electronic Signature(s) Signed: 01/13/2021 4:54:33 PM By: [name] Entered By: [name] on 01/13/2021 07:52:23

## 2021-01-15 ENCOUNTER — Other Ambulatory Visit: Payer: Self-pay

## 2021-01-15 ENCOUNTER — Encounter (HOSPITAL_BASED_OUTPATIENT_CLINIC_OR_DEPARTMENT_OTHER): Payer: BC Managed Care – PPO | Admitting: Internal Medicine

## 2021-01-15 DIAGNOSIS — E11621 Type 2 diabetes mellitus with foot ulcer: Secondary | ICD-10-CM | POA: Diagnosis not present

## 2021-01-15 NOTE — Progress Notes (Signed)
[place], [name] ([id number]) Visit Report for 01/15/2021 Arrival Information Details Patient Name: Date of Service: [name] D 01/15/2021 7:30 A M Medical Record Number: [id number] Patient Account Number: [id number] Date of Birth/Sex: Treating RN: 1985/08/18 (35 y.o. [name] Primary Care Priscilla Kirstein: [name] Other Clinician: Referring Earlena Werst: Treating Thara Searing/Extender: Gwyneth Revels in Treatment: 53 Visit Information History Since Last Visit Added or deleted any medications: No Patient Arrived: Ambulatory Any new allergies or adverse reactions: No Arrival Time: 07:40 Had a fall or experienced change in No Transfer Assistance: None activities of daily living that may affect Patient Identification Verified: Yes risk of falls: Secondary Verification Process Completed: Yes Signs or symptoms of abuse/neglect since No Patient Requires Transmission-Based Precautions: No last visito Patient Has Alerts: No Hospitalized since last visit: No Implantable device outside of the clinic No excluding cellular tissue based products placed in the center since last visit: Has Dressing in Place as Prescribed: Yes Has Footwear/Offloading in Place as Yes Prescribed: Left: Surgical Shoe with Pressure Relief Insole Pain Present Now: No Electronic Signature(s) Signed: 01/15/2021 12:50:57 PM By: Antonieta Iba Entered By: Antonieta Iba on 01/15/2021 07:40:37 -------------------------------------------------------------------------------- Clinic Level of Care Assessment Details Patient Name: Date of Service: [name] D 01/15/2021 7:30 A M Medical Record Number: [id number] Patient Account Number: [id number] Date of Birth/Sex: Treating RN: 04-Nov-1985 (35 y.o. [name] Primary Care Hendricks Schwandt: [name] Other Clinician: Referring Lorilee Cafarella: Treating Timea Breed/Extender: Gwyneth Revels in Treatment: 64 Clinic  Level of Care Assessment Items TOOL 4 Quantity Score X- 1 0 Use when only an EandM is performed on FOLLOW-UP visit ASSESSMENTS - Nursing Assessment / Reassessment X- 1 10 Reassessment of Co-morbidities (includes updates in patient status) X- 1 5 Reassessment of Adherence to Treatment Plan ASSESSMENTS - Wound and Skin A ssessment / Reassessment X - Simple Wound Assessment / Reassessment - one wound 1 5 []  - 0 Complex Wound Assessment / Reassessment - multiple wounds []  - 0 Dermatologic / Skin Assessment (not related to wound area) ASSESSMENTS - Focused Assessment []  - 0 Circumferential Edema Measurements - multi extremities []  - 0 Nutritional Assessment / Counseling / Intervention []  - 0 Lower Extremity Assessment (monofilament, tuning fork, pulses) []  - 0 Peripheral Arterial Disease Assessment (using hand held doppler) ASSESSMENTS - Ostomy and/or Continence Assessment and Care []  - 0 Incontinence Assessment and Management []  - 0 Ostomy Care Assessment and Management (repouching, etc.) PROCESS - Coordination of Care []  - 0 Simple Patient / Family Education for ongoing care X- 1 20 Complex (extensive) Patient / Family Education for ongoing care []  - 0 Staff obtains , Records, T Results / Process Orders est []  - 0 Staff telephones HHA, Nursing Homes / Clarify orders / etc []  - 0 Routine Transfer to another Facility (non-emergent condition) []  - 0 Routine Hospital Admission (non-emergent condition) []  - 0 New Admissions / / Ordering NPWT Apligraf, etc. , []  - 0 Emergency Hospital Admission (emergent condition) []  - 0 Simple Discharge Coordination []  - 0 Complex (extensive) Discharge Coordination PROCESS - Special Needs []  - 0 Pediatric / Minor Patient Management []  - 0 Isolation Patient Management []  - 0 Hearing / Language / Visual special needs []  - 0 Assessment of Community assistance (transportation, D/C planning, etc.) []  -  0 Additional assistance / Altered mentation []  - 0 Support Surface(s) Assessment (bed, cushion, seat, etc.) INTERVENTIONS - Wound Cleansing / Measurement X - Simple Wound Cleansing - one wound  1 5 []  - 0 Complex Wound Cleansing - multiple wounds []  - 0 Wound Imaging (photographs - any number of wounds) []  - 0 Wound Tracing (instead of photographs) X- 1 5 Simple Wound Measurement - one wound []  - 0 Complex Wound Measurement - multiple wounds INTERVENTIONS - Wound Dressings []  - 0 Small Wound Dressing one or multiple wounds []  - 0 Medium Wound Dressing one or multiple wounds X- 1 20 Large Wound Dressing one or multiple wounds []  - 0 Application of Medications - topical []  - 0 Application of Medications - injection INTERVENTIONS - Miscellaneous []  - 0 External ear exam []  - 0 Specimen Collection (cultures, biopsies, blood, body fluids, etc.) []  - 0 Specimen(s) / Culture(s) sent or taken to Lab for analysis []  - 0 Patient Transfer (multiple staff / / Similar devices) []  - 0 Simple Staple / Suture removal (25 or less) []  - 0 Complex Staple / Suture removal (26 or more) []  - 0 Hypo / Hyperglycemic Management (close monitor of Blood Glucose) []  - 0 Ankle / Brachial Index (ABI) - do not check if billed separately X- 1 5 Vital Signs Has the patient been seen at the hospital within the last three years: Yes Total Score: 75 Level Of Care: New/Established - Level 2 Electronic Signature(s) Signed: 01/15/2021 12:50:57 PM By: Entered By: on 01/15/2021 08:13:50 -------------------------------------------------------------------------------- Encounter Discharge Information Details Patient Name: Date of Service: , [name] 01/15/2021 7:30 A M Medical Record Number: Patient Account Number: Date of Birth/Sex: Treating RN: 1985/07/03 (35 y.o. Primary Care Zoiee Wimmer: Nurse, adult Other  Clinician: Referring Jarman Litton: Treating Dong Nimmons/Extender: in Treatment: 64 Encounter Discharge Information Items Discharge Condition: Stable Ambulatory Status: Ambulatory Discharge Destination: Home Transportation: Private Auto Schedule Follow-up Appointment: Yes Clinical Summary of Care: Provided on 01/15/2021 Form Type Recipient Paper Patient Patient Electronic Signature(s) Signed: 01/15/2021 12:50:57 PM By: 01/17/2021 Entered By: Antonieta Iba on 01/15/2021 08:13:17 -------------------------------------------------------------------------------- Patient/Caregiver Education Details Patient Name: Date of Service: 01/17/2021, [name] 10/14/2022andnbsp7:30 A M Medical Record Number: 01/17/2021 Patient Account Number: [id number] Date of Birth/Gender: Treating RN: Dec 10, 1985 (35 y.o. 20 Primary Care Physician: [name] Other Clinician: Referring Physician: Treating Physician/Extender: [name] in Treatment: 28 Education Assessment Education Provided To: Patient Education Topics Provided Elevated Blood Sugar/ Impact on Healing: Methods: Explain/Verbal Responses: State content correctly Offloading: Methods: Explain/Verbal Responses: State content correctly Wound/Skin Impairment: Methods: Explain/Verbal Responses: State content correctly Electronic Signature(s) Signed: 01/15/2021 12:50:57 PM By: 01/17/2021 Entered By: 01/17/2021 on 01/15/2021 08:12:55 -------------------------------------------------------------------------------- Wound Assessment Details Patient Name: Date of Service: Antonieta Iba, [name] 01/15/2021 7:30 A M Medical Record Number: [name] Patient Account Number: 01/17/2021 Date of Birth/Sex: Treating RN: 1985/08/13 (35 y.o. 09/27/1985 Primary Care Granvel Proudfoot: 20 Other Clinician: Referring Cam Dauphin: Treating Daphnie Venturini/Extender: [name] in Treatment: 64 Wound Status Wound Number: 3 Primary Etiology: Diabetic Wound/Ulcer of the Lower Extremity Wound Location: Left, Lateral Foot Wound Status: Open Wounding Event: Trauma Comorbid History: Type II Diabetes Date Acquired: 10/02/2019 Weeks Of Treatment: 64 Clustered Wound: No Wound Measurements Length: (cm) 3.6 Width: (cm) 3.5 Depth: (cm) 0.4 Area: (cm) 9.896 Volume: (cm) 3.958 % Reduction in Area: -500.1% % Reduction in Volume: -2298.8% Epithelialization: None Tunneling: No Undermining: No Wound Description Classification: Grade 2 Wound Margin: Distinct, outline attached Exudate Amount: Medium Exudate Type: Serosanguineous Exudate Color: red,  brown Foul Odor After Cleansing: No Slough/Fibrino No Wound Bed Granulation Amount: Large (67-100%) Exposed Structure Granulation Quality: Red, Pink Fascia Exposed: No Necrotic Amount: None Present (0%) Fat Layer (Subcutaneous Tissue) Exposed: Yes Tendon Exposed: No Muscle Exposed: No Joint Exposed: No Bone Exposed: No Treatment Notes Wound #3 (Foot) Wound Laterality: Left, Lateral Cleanser Soap and Water Discharge Instruction: May shower and wash wound with dial antibacterial soap and water prior to dressing change. Wound Cleanser Discharge Instruction: Cleanse the wound with wound cleanser prior to applying a clean dressing using gauze sponges, not tissue or cotton balls. Peri-Wound Care Zinc Oxide Ointment 30g tube Discharge Instruction: Apply Zinc Oxide to periwound with each dressing change as needed. Topical keystone antibiotic compound Discharge Instruction: thin layer to wound bed Primary Dressing KerraCel Ag Gelling Fiber Dressing, 4x5 in (silver alginate) Discharge Instruction: Apply silver alginate to wound bed as instructed Secondary Dressing Woven Gauze Sponge, Non-Sterile 4x4 in Discharge Instruction: Apply over primary dressing as directed. Zetuvit Plus 4x8  in Discharge Instruction: Apply over primary dressing as directed. Optifoam Non-Adhesive Dressing, 4x4 in Discharge Instruction: Apply over primary dressing as directed. Secured With [employer] 4x5 (in/yd) Discharge Instruction: Secure with Coban as directed. Kerlix Roll Sterile, 4.5x3.1 (in/yd) Discharge Instruction: Secure with Kerlix as directed. 59M Medipore H Soft Cloth Surgical T 4 x 2 (in/yd) ape Discharge Instruction: Secure dressing with tape as directed. Compression Wrap Compression Stockings Add-Ons Soft Cast Electronic Signature(s) Signed: 01/15/2021 12:50:57 PM By: Antonieta Iba Entered By: Antonieta Iba on 01/15/2021 08:11:48 -------------------------------------------------------------------------------- Vitals Details Patient Name: Date of Service: [name], [name] 01/15/2021 7:30 A M Medical Record Number: [id number] Patient Account Number: [id number] Date of Birth/Sex: Treating RN: April 28, 1985 (35 y.o. [name] Primary Care Aldahir Litaker: [name] Other Clinician: Referring Anni Hocevar: Treating Cordie Beazley/Extender: Gwyneth Revels in Treatment: 64 Vital Signs Time Taken: 07:43 Temperature (F): 98.3 Height (in): 77 Pulse (bpm): 92 Weight (lbs): 280 Respiratory Rate (breaths/min): 16 Body Mass Index (BMI): 33.2 Blood Pressure (mmHg): 141/79 Capillary Blood Glucose (mg/dl): 128 Reference Range: 80 - 120 mg / dl Electronic Signature(s) Signed: 01/15/2021 12:50:57 PM By: Antonieta Iba Entered By: Antonieta Iba on 01/15/2021 07:46:12

## 2021-01-15 NOTE — Progress Notes (Signed)
[place], [name] ([id number]) Visit Report for 01/15/2021 Soft Cast Details Patient Name: Date of Service: [name] 01/15/2021 7:30 A M Medical Record Number: [id number] Patient Account Number: [id number] Date of Birth/Sex: Treating RN: Sep 24, 1985 (35 y.o. [name] Primary Care Provider: [name] Other Clinician: Referring Provider: Treating Provider/Extender: Gwyneth Revels in Treatment: 94 Procedure Performed for: Wound #3 Left,Lateral Foot Performed By: Physician Maxwell Caul., MD Electronic Signature(s) Signed: 01/15/2021 12:39:46 PM By: Baltazar Najjar MD Signed: 01/15/2021 12:50:57 PM By: Antonieta Iba Entered By: Antonieta Iba on 01/15/2021 08:12:16 -------------------------------------------------------------------------------- SuperBill Details Patient Name: Date of Service: [name], [name] 01/15/2021 Medical Record Number: [id number] Patient Account Number: [id number] Date of Birth/Sex: Treating RN: 1986/02/11 (35 y.o. 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

## 2021-01-19 ENCOUNTER — Encounter (HOSPITAL_BASED_OUTPATIENT_CLINIC_OR_DEPARTMENT_OTHER): Payer: BC Managed Care – PPO | Admitting: Internal Medicine

## 2021-01-19 ENCOUNTER — Other Ambulatory Visit: Payer: Self-pay

## 2021-01-19 DIAGNOSIS — E11621 Type 2 diabetes mellitus with foot ulcer: Secondary | ICD-10-CM | POA: Diagnosis not present

## 2021-01-20 NOTE — Progress Notes (Signed)
[name], [name] ([id number]) Visit Report for 01/19/2021 Debridement Details Patient Name: Date of Service: [name] 01/19/2021 7:30 A M Medical Record Number: [id number] Patient Account Number: [id number] Date of Birth/Sex: Treating RN: 03/03/1986 (35 y.o. [name] Primary Care Provider: [name] Other Clinician: Referring Provider: Treating Provider/Extender: [name] in Treatment: 64 Debridement Performed for Assessment: Wound #3 Left,Lateral Foot Performed By: Physician [name], MD Debridement Type: Debridement Severity of Tissue Pre Debridement: Fat layer exposed Level of Consciousness (Pre-procedure): Awake and Alert Pre-procedure Verification/Time Out Yes - 08:19 Taken: Start Time: 08:20 Pain Control: Other : Benzocaine T Area Debrided (L x W): otal 3.6 (cm) x 3.3 (cm) = 11.88 (cm) Tissue and other material debrided: Non-Viable, Callus, Skin: Dermis Level: Skin/Dermis Debridement Description: Selective/Open Wound Instrument: Curette, Other : Silver Nitrate Bleeding: Minimum Hemostasis Achieved: Pressure End Time: 08:25 Response to Treatment: Procedure was tolerated well Level of Consciousness (Post- Awake and Alert procedure): Post Debridement Measurements of Total Wound Length: (cm) 3.6 Width: (cm) 3.3 Depth: (cm) 0.4 Volume: (cm) 3.732 Character of Wound/Ulcer Post Debridement: Stable Severity of Tissue Post Debridement: Fat layer exposed Post Procedure Diagnosis Same as Pre-procedure Electronic Signature(s) Signed: 01/19/2021 4:56:56 PM By: [name] MD Signed: 01/20/2021 5:56:18 PM By: [name] RN, BSN Entered By: [name] on 01/19/2021 08:29:35 -------------------------------------------------------------------------------- HPI Details Patient Name: Date of Service: [name], [name] 01/19/2021 7:30 A M Medical Record Number: [id number] Patient Account Number:  [id number] Date of Birth/Sex: Treating RN: December 11, 1985 (35 y.o. 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 He comes in for review of a wound over the plantar fifth metatarsal head extending into the lateral part of the foot. He was followed for this previously by his podiatrist Dr. Cornelius Moras. As the patient tells his story he went to see podiatry first for a swelling he developed on the lateral part of his fifth metatarsal head in May. He states this was "open" by podiatry and the area closed. He was followed up in June and it was again opened callus removed and it closed promptly. There were plans being made for surgery on the fifth metatarsal head in June however his blood sugar was apparently too high for anesthesia. Apparently the area was debrided and opened again in June and it is never closed since. Looking over the records from podiatry I am really not able to follow this. It was clear when he was first seen it was before 5/14 at that point he already had a wound. By 5/17 the ulcer was resolved. I do not see anything about a procedure. On 5/28 noted to have pre-ulcerative moderate keratosis. X-ray noted 1/5 contracted toe and tailor's bunion and metatarsal deformity. On a visit date on 09/28/2018 the dorsal part of the left foot it healed and resolved. There was concern about swelling in his lower extremity he was sent to the ER.. As far as I can tell he was seen in the ER on 7/12 with an ulcer on his left foot. A DVT rule out of the left leg was negative. I do not think I have complete records from podiatry but I am not able to verify the procedures this patient states he had. He states after the last procedure  the  wound has never closed although I am not able to follow this in the records I have from podiatry. He has not had a recent x-ray The patient has been using Neosporin on the wound. He is wearing a Darco shoe. He is still very active up on his foot working and exercising. Past medical history; type 2 diabetes ketosis-prone, leg swelling with a negative DVT study in July. Non-smoker ABI in our clinic was 0.85 on the left 10/16; substantial wound on the plantar left fifth met head extending laterally almost to the dorsal fifth MTP. We have been using silver alginate we gave him a Darco forefoot off loader. An x-ray did not show evidence of osteomyelitis did note soft tissue emphysema which I think was due to gas tracking through an open wound. There is no doubt in my mind he requires an MRI 10/23; MRI not booked until 3 November at the earliest this is largely due to his glucose sensor in the right arm. We have been using silver alginate. There has been an improvement 10/29; I am still not exactly sure when his MRI is booked for. He says it is the third but it is the 10th in epic. This definitely needs to be done. He is running a low-grade fever today but no other symptoms. No real improvement in the 1 02/26/2019 patient presents today for a follow-up visit here in our clinic he is last been seen in the clinic on October 29. Subsequently we were working on getting MRI to evaluate and see what exactly was going on and where we would need to go from the standpoint of whether or not he had osteomyelitis and again what treatments were going be required. Subsequently the patient ended up being admitted to the hospital on 02/07/2019 and was discharged on 02/14/2019. This is a somewhat interesting admission with a discharge diagnosis of pneumonia due to COVID-19 although he was positive for COVID-19 when tested at the urgent care but negative x2 when he was actually in the hospital. With that being said he did  have acute respiratory failure with hypoxia and it was noted he also have a left foot ulceration with osteomyelitis. With that being said he did require oxygen for his pneumonia and I level 4 L. He was placed on antivirals and steroids for the COVID-19. He was also transferred to the [facility] at one point. Nonetheless he did subsequently discharged home and since being home has done much better in that regard. The CT angiogram did not show any pulmonary embolism. With regard to the osteomyelitis the patient was placed on vancomycin and Zosyn while in the hospital but has been changed to Augmentin at discharge. It was also recommended that he follow- up with wound care and podiatry. Podiatry however wanted him to see [name] according to the patient prior to them doing anything further. His hemoglobin A1c was 9.9 as noted in the hospital. Have an MRI of the left foot performed while in the hospital on 02/04/2019. 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 There is an overlying plantar open wound noted an abscess tracking back along the lateral aspect of the fifth metatarsal shaft. There is otherwise diffuse cellulitis and mild fasciitis without findings of polymyositis. The patient did have recently pneumonia secondary to COVID-19 I looked in the chart through epic and it does appear that the patient may need to have an  additional x-ray just to ensure everything is cleared and that he has no airspace disease prior to putting him into the [name]. 03/05/2019; patient was readmitted to the clinic last week. He was hospitalized twice for a viral upper respiratory tract infection from 11/1 through 11/4 and then 11/5 through 11/12 ultimately this turned out to be Covid pneumonitis. Although he was discharged on oxygen he is not using it. He says he feels fine. He has no exercise limitation no cough no sputum.  His O2 sat in our clinic today was 100% on room air. 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 He received Vanco and Zosyn in the hospital and then was discharged on 2 weeks of Augmentin. I do not see any relevant cultures. He was supposed to follow-up with infectious disease but I do not see that he has an appointment. 12/8; patient saw Dr. Novella Olive of infectious disease last week. He felt that he had had adequate antibiotic therapy. He did not go to follow-up with Dr. Amalia Hailey of podiatry and I have again talked to him about the pros and cons of this. He does not want to consider a ray amputation of this time. He is aware of the risks of recurrence, migration etc. He started HBO today and tolerated this well. He can complete the Augmentin that I gave him last week. I have looked over the lab work that Dr. Chana Bode ordered his C-reactive protein was 3.3 and his sedimentation rate was 17. The C-reactive protein is never really been measurably that high in this patient 12/15; not much change in the wound today however he has undermining along the lateral part of the foot again more extensively than last week. He has some rims of epithelialization. We have been using silver alginate. He is undergoing hyperbarics but did not dive today 12/18; in for his obligatory first total contact cast change. Unfortunately there was pus coming from the undermining area around his fifth metatarsal head. This was cultured but will preclude reapplication of a cast. He is seen in conjunction with HBO 12/24; patient had staph lugdunensis in the wound in the undermining area laterally last time. We put him on doxycycline which should have covered this. The wound looks better today. I am going to give him another week of doxycycline before reattempting the total contact cast 12/31; the patient is completing  antibiotics. Hemorrhagic debris in the distal part of the wound with some undermining distally. He also had hyper granulation. Extensive debridement with a #5 curette. The infected area that was on the lateral part of the fifth met head is closed over. I do not think he needs any more antibiotics. Patient was seen prior to HBO. Preparations for a total contact cast were made in the cast will be placed post hyperbarics 04/11/19; once again the patient arrives today without complaint. He had been in a cast all week noted that he had heavy drainage this week. This resulted in large raised areas of macerated tissue around the wound 1/14; wound bed looks better slightly smaller. Hydrofera Blue has been changing himself. He had a heavy drainage last week which caused a lot of maceration around the wound so I took him out of a total contact cast he says the drainage is actually better this week He is seen today in conjunction with HBO 1/21; returns to clinic. He was up in [place] for  a day or 2 attending a funeral. He comes back in with the wound larger and with a large area of exposed bone. He had osteomyelitis and septic arthritis of the fifth left metatarsal head while he was in hospital. He received IV antibiotics in the hospital for a prolonged period of time then 3 weeks of Augmentin. Subsequently I gave him 2 weeks of doxycycline for more superficial wound infection. When I saw this last week the wound was smaller the surface of the wound looks satisfactory. 1/28; patient missed hyperbarics today. Bone biopsy I did last time showed Enterococcus faecalis and Staphylococcus lugdunensis . He has a wide area of exposed bone. We are going to use silver alginate as of today. I had another ethical discussion with the patient. 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 In this situation I think the likelihood of healing this is low. Therefore I have recommended a ray  amputation and with the patient's agreement I have referred him to Dr. Doran Durand. The other issue is that his compliance with hyperbarics has been minimal because of his work schedule and given his underlying decision I am going to stop this today READMISSION 10/24/2019 MRI 09/29/2019 left foot IMPRESSION: 1. Apparent skin ulceration inferior and lateral to the 5th metatarsal base with underlying heterogeneous T2 signal and enhancement in the subcutaneous fat. Small peripherally enhancing fluid collections along the plantar and lateral aspects of the 5th metatarsal base suspicious for abscesses. 2. Interval amputation through the mid 5th metatarsal with nonspecific low-level marrow edema and enhancement. Given the proximity to the adjacent soft tissue inflammatory changes, osteomyelitis cannot be excluded. 3. The additional bones appear unremarkable. MRI 09/29/2019 right foot IMPRESSION: 1. Soft tissue ulceration lateral to the 5th MTP joint. There is low-level T2 hyperintensity within the 4th and 5th metatarsal heads and adjacent proximal phalanges without abnormal T1 signal or cortical destruction. These findings are nonspecific and could be seen with early marrow edema, hyperemia or early osteomyelitis. No evidence of septic joint. 2. Mild tenosynovitis and synovial enhancement associated with the extensor digitorum tendons at the level of the midfoot. 3. Diffuse low-level muscular T2 hyperintensity and enhancement, most consistent with diabetic myopathy. LEFT FOOT BONE Methicillin resistant staphylococcus aureus Staphylococcus lugdunensis MIC MIC CIPROFLOXACIN >=8 RESISTANT Resistant <=0.5 SENSI... Sensitive CLINDAMYCIN <=0.25 SENS... Sensitive >=8 RESISTANT Resistant ERYTHROMYCIN >=8 RESISTANT Resistant >=8 RESISTANT Resistant GENTAMICIN <=0.5 SENSI... Sensitive <=0.5 SENSI... Sensitive Inducible Clindamycin NEGATIVE Sensitive NEGATIVE Sensitive OXACILLIN >=4 RESISTANT Resistant 2  SENSITIVE Sensitive RIFAMPIN <=0.5 SENSI... Sensitive <=0.5 SENSI... Sensitive TETRACYCLINE <=1 SENSITIVE Sensitive <=1 SENSITIVE Sensitive TRIMETH/SULFA <=10 SENSIT Sensitive <=10 SENSIT Sensitive ... Marland Kitchen.. VANCOMYCIN 1 SENSITIVE Sensitive <=0.5 SENSI... Sensitive Right foot bone . Component 3 wk ago Specimen Description BONE Special Requests RIGHT 4 METATARSAL SAMPLE B Gram Stain NO WBC SEEN NO ORGANISMS SEEN Culture RARE METHICILLIN RESISTANT STAPHYLOCOCCUS AUREUS NO ANAEROBES ISOLATED Performed at [facility], [place] [address], [place], [place] [address] Report Status 10/08/2019 FINAL Organism ID, Bacteria METHICILLIN RESISTANT STAPHYLOCOCCUS AUREUS Resulting Agency [facility] Susceptibility Methicillin resistant staphylococcus aureus MIC CIPROFLOXACIN >=8 RESISTANT Resistant CLINDAMYCIN <=0.25 SENS... Sensitive ERYTHROMYCIN >=8 RESISTANT Resistant GENTAMICIN <=0.5 SENSI... Sensitive Inducible Clindamycin NEGATIVE Sensitive OXACILLIN >=4 RESISTANT Resistant RIFAMPIN <=0.5 SENSI... Sensitive TETRACYCLINE <=1 SENSITIVE Sensitive TRIMETH/SULFA <=10 SENSIT Sensitive ... VANCOMYCIN 1 SENSITIVE Sensitive This is a patient we had in clinic earlier this year with a wound over his left fifth metatarsal head. 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 Doran Durand on 05/16/2019. He seems to have developed problems with wounds on his bilateral feet in June 2021 although he may have had problems earlier than this. He was in an urgent care with a right foot ulcer on 09/26/2019 and given a course of doxycycline. This was apparently after having trouble getting into see orthopedics. He was seen by podiatry  on 09/28/2019 noted to have bilateral lower extremity ulcers including the left lateral fifth metatarsal base and the right subfifth met head. It was noted that had purulent drainage at that time. He required hospitalization from 6/20 through 7/2. This was because of worsening right foot wounds. He underwent bilateral operative incision and drainage and bone biopsies bilaterally. Culture results are listed above. He has been referred back to clinic by Dr. Jacqualyn Posey of podiatry. He is also followed by Dr. Megan Salon who saw him yesterday. He was discharged from hospital on Zyvox Flagyl and Levaquin and yesterday changed to doxycycline Flagyl and Levaquin. His inflammatory markers on 6/26 showed a sedimentation rate of 129 and a C-reactive protein of 5. This is improved to 14 and 1.3 respectively. This would indicate improvement. ABIs in our clinic today were 1.23 on the right and 1.20 on the left 11/01/2019 on evaluation today patient appears to be doing fairly well in regard to the wounds on his feet at this point. Fortunately there is no signs of active infection at this time. No fevers, chills, nausea, vomiting, or diarrhea. He currently is seeing infectious disease and still under their care at this point. Subsequently he also has both wounds which she has not been using collagen on as he did not receive that in his packaging he did not call us and let us know that. Apparently that just was missed on the order. Nonetheless we will get that straightened out today. 8/9-Patient returns for bilateral foot wounds, using Prisma with hydrogel moistened dressings, and the wounds appear stable. Patient using surgical shoes, avoiding much pressure or weightbearing as much as possible 8/16; patient has bilateral foot wounds. 1 on the right lateral foot proximally the other is on the left mid lateral foot. Both required debridement of callus and thick skin around the wounds. We have been using silver collagen 8/27;  patient has bilateral lateral foot wounds. The area on the left substantially surrounded by callus and dry skin. This was removed from the wound edge. The underlying wound is small. The area on the right measured somewhat smaller today. 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 Unfortunately I did not update his antibiotics during today's visit. 9/10 I reviewed Dr. Hale Bogus last notes he felt he had completed antibiotics his inflammatory markers were reasonably well controlled. He has a small wound on the lateral left foot and a tiny area on the right which is just above closed. He is using Hydrofera Blue with border foam he has bilateral surgical shoes 9/24; 2 week f/u. doing well. right foot is closed. left foot still undermined. 10/14; right foot remains closed at the fifth met head. The area over the base of the left fifth metatarsal has a small open area but considerable undermining towards the plantar foot. Thick callus skin around this suggests  an adequate pressure relief. We have talked about this. He says he is going to go back into his cam boot. I suggested a total contact cast he did not seem enamored with this suggestion 10/26; left foot base of the fifth metatarsal. Same condition as last time. He has skin over the area with an open wound however the skin is not adherent. He went to see Dr. Earleen Newport who did an x-ray and culture of his foot I have not reviewed the x-ray but the patient was not told anything. He is on doxycycline 11/11; since the patient was last here he was in the emergency room on 10/30 he was concerned about swelling in the left foot. They did not do any cultures or x-rays. They changed his antibiotics to cephalexin. Previous culture showed group B strep. The cephalexin is appropriate as doxycycline has less than predictable coverage. Arrives in clinic today with swelling over this area under the wound.  He also has a new wound on the right fifth metatarsal head 11/18; the patient has a difficult wound on the lateral aspect of the left fifth metatarsal head. The wound was almost ballotable last week I opened it slightly expecting to see purulence however there was just bleeding. I cultured this this was negative. X-ray unchanged. We are trying to get an MRI but I am not sure were going to be able to get this through his insurance. He also has an area on the right lateral fifth metatarsal head this looks healthier 12/3; the patient finally got our MRI. Surprisingly this did not show osteomyelitis. I did show the soft tissue ulceration at the lateral plantar aspect of the fifth metatarsal base with a tiny residual 6 mm abscess overlying the superficial fascia I have tried to culture this area I have not been able to get this to grow anything. Nevertheless the protruding tissue looks aggravated. I suspect we should try to treat the underlying "abscess with broad-spectrum antibiotics. I am going to start him on Levaquin and Flagyl. He has much less edema in his legs and I am going to continue to wrap his legs and see him weekly 12/10. I started Levaquin and Flagyl on him last week. He just picked up the Flagyl apparently there was some delay. The worry is the wound on the left fifth metatarsal base which is substantial and worsening. His foot looks like he inverts at the ankle making this a weightbearing surface. Certainly no improvement in fact I think the measurements of this are somewhat worse. We have been using 12/17; he apparently just got the Levaquin yesterday this is 2 weeks after the fact. He has completed the Flagyl. The area over the left fifth metatarsal base still has protruding granulation tissue although it does not look quite as bad as it did some weeks ago. He has severe bilateral lymphedema although we have not been treating him for wounds on his legs this is definitely going to require  compression. There was so much edema in the left I did not wish to put him in a total contact cast today. I am going to increase his compression from 3-4 layer. The area on the right lateral fifth met head actually look quite good and superficial. 12/23; patient arrived with callus on the right fifth met head and the substantial hyper granulated callused wound on the base of his fifth metatarsal. He says he is completing his Levaquin in 2 days but I do not think that adds up with  what I gave him but I will have to double check this. We are using Hydrofera Blue on both areas. My plan is to put the left leg in a cast the week after New Year's 04/06/2020; patient's wounds about the same. Right lateral fifth metatarsal head and left lateral foot over the base of the fifth metatarsal. There is undermining on the left lateral foot which I removed before application of total contact cast continuing with Hydrofera Blue new. Patient tells me he was seen by endocrinology today lab work was done [Dr. Kerr]. Also wondering whether he was referred to cardiology. I went over some lab work from previously does not have chronic renal failure certainly not nephrotic range proteinuria he does have very poorly controlled diabetes but this is not his most updated lab work. Hemoglobin A1c has been over 11 1/10; the patient had a considerable amount of leakage towards mid part of his left foot with macerated skin however the wound surface looks better the area on the right lateral fifth met head is better as well. I am going to change the dressing on the left foot under the total contact cast to silver alginate, continue with Hydrofera Blue on the right. 1/20; patient was in the total contact cast for 10 days. Considerable amount of drainage although the skin around the wound does not look too bad on the left foot. The area on the right fifth metatarsal head is closed. Our nursing staff reports large amount of drainage out  of the left lateral foot wound 1/25; continues with copious amounts of drainage described by our intake staff. PCR culture I did last week showed E. coli and Enterococcus faecalis and low quantities. Multiple resistance genes documented including extended spectrum beta lactamase, MRSA, MRSE, quinolone, tetracycline. The wound is not quite as good this week as it was 5 days ago but about the same size 2/3; continues with copious amounts of malodorous drainage per our intake nurse. The PCR culture I did 2 weeks ago showed E. coli and low quantities of Enterococcus. There were multiple resistance genes detected. I put Neosporin on him last week although this does not seem to have helped. The wound is slightly deeper today. Offloading continues to be an issue here although with the amount of drainage she has a total contact cast is just not going to work 2/10; moderate amount of drainage. Patient reports he cannot get his stocking on over the dressing. I told him we have to do that the nurse gave him suggestions on how to make this work. The wound is on the bottom and lateral part of his left foot. Is cultured predominantly grew low amounts of Enterococcus, E. coli and anaerobes. There were multiple resistance genes detected including extended spectrum beta lactamase, quinolone, tetracycline. I could not think of an easy oral combination to address this so for now I am going to do topical antibiotics provided by [facility] I think the main agents here are vancomycin and an aminoglycoside. We have to be able to give him access to the wounds to get the topical antibiotic on 2/17; moderate amount of drainage this is unchanged. He has his Keystone topical antibiotic against the deep tissue culture organisms. He has been using this and changing the dressing daily. Silver alginate on the wound surface. 2/24; using Keystone antibiotic with silver alginate on the top. He had too much drainage for a total contact  cast at one point although I think that is improving and I think  in the next week or 2 it might be possible to replace a total contact cast I did not do this today. In general the wound surface looks healthy however he continues to have thick rims of skin and subcutaneous tissue around the wide area of the circumference which I debrided 06/04/2020 upon evaluation today patient appears to be doing well in regard to his wound. I do feel like he is showing signs of improvement. There is little bit of callus and dead tissue around the edges of the wound as well as what appears to be a little bit of a sinus tract that is off to the side laterally I would perform debridement to clear that away today. 3/17; left lateral foot. The wound looks about the same as I remember. Not much depth surface looks healthy. No evidence of infection 3/25; left lateral foot. Wound surface looks about the same. Separating epithelium from the circumference. There really is no evidence of infection here however not making progress by my view 3/29; left lateral foot. Surface of the wound again looks reasonably healthy still thick skin and subcutaneous tissue around the wound margins. There is no evidence of infection. One of the concerns being brought up by the nurses has again the amount of drainage vis--vis continued use of a total contact cast 4/5; left lateral foot at roughly the base of the fifth metatarsal. Nice healthy looking granulated tissue with rims of epithelialization. The overall wound measurements are not any better but the tissue looks healthy. The only concern is the amount of drainage although he has no surrounding maceration with what we have been doing recently to absorb fluid and protect his skin. He also has lymphedema. 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 He states that the kerlix/coban under the TCC rolled down and could not get it back up. He has been using an offloading scooter and has somehow hurt his right foot using this device. This happened last week. He states that the side of his right foot developed a blister and opened. The top of his foot also has a few small open wounds he thinks is due to his socks rubbing in his shoes. He has not been using any dressings to the wound. He denies purulent drainage, fever/chills or erythema to the wounds. 4/22; patient presents for 1 week follow-up. He developed new wounds to the right foot that were evaluated at last clinic visit. He continues to have a total contact cast to the left leg and he reports no issues. He has been using silver collagen to the right foot wounds with no issues. He denies purulent drainage, fever/chills or erythema to the right foot wounds. He has no complaints today 4/25; patient presents for 1 week follow-up. He has a total contact cast of the left leg and reports no issues. He has been using silver alginate to the right foot wound. He denies purulent drainage, fever/chills or erythema to the right foot wounds. 5/2 patient presents for 1 week follow-up. T contact cast on the left. The wound which is on the base of the plantar foot at the base of the fifth metatarsal otal actually looks quite good and dimensions continue to gradually contract. HOWEVER the area  on the right lateral fifth metatarsal head is much larger than what I remember from 2 weeks ago. Once more is he has significant levels of hypergranulation. Noteworthy that he had this same hyper granulated response on his wound on the left foot at one point in time. So much so that he I thought there was an underlying fluid collection. Based on this I think this just needs debridement. 5/9; the wound on the left actually  continues to be gradually smaller with a healthy surface. Slight amount of drainage and maceration of the skin around but not too bad. However he has a large wound over the right fifth metatarsal head very much in the same configuration as his left foot wound was initially. I used silver nitrate to address the hyper granulated tissue no mechanical debridement 5/16; area on the left foot did not look as healthy this week deeper thick surrounding macerated skin and subcutaneous tissue. The area on the right foot fifth met head was about the same The area on the right ankle that we identified last week is completely broken down into an open wound presumably a stocking rubbing issue 5/23; patient has been using a total contact cast to the left side. He has been using silver alginate underneath. He has also been using silver alginate to the right foot wounds. He has no complaints today. He denies any signs of infection. 5/31; the left-sided wound looks some better measure smaller surface granulation looks better. We have been using silver alginate under the total contact cast The large area on his right fifth met head and right dorsal foot look about the same still using silver alginate 6/6; neither side is good as I was hoping although the surface area dimensions are better. A lot of maceration on his left and right foot around the wound edge. Area on the dorsal right foot looks better. He says he was traveling. I am not sure what does the amount of maceration around the plantar wounds may be drainage issues 6/13; in general the wound surfaces look quite good on both sides. Macerated skin and raised edges around the wound required debridement although in general especially on the left the surface area seems improved. The area on the right dorsal ankle is about the same I thought this would not be such a problem to close 6/20; not much change in either wound although the one on the right looks a little  better. Both wounds have thick macerated edges to the skin requiring debridements. We have been using silver alginate. The area on the dorsal right ankle is still open I thought this would be closed. 6/28; patient comes in today with a marked deterioration in the right foot wound fifth met head. Wide area of exposed bone this is a drastic change from last time. The area on the left there we have been casting is stagnant. We have been using silver alginate in both wound areas. 7/5; bone culture I did for PCR last time was positive for Pseudomonas, group B strep, Enterococcus and Staph aureus. There was no suggestion of methicillin resistance or ampicillin resistant genes. This was resistant to tetracycline however He comes into the clinic today with the area over his right plantar fifth metatarsal head which had been doing so well 2 weeks ago completely necrotic feeling bone. I do not know that this is going to be salvageable. The left foot wound is certainly no smaller but it has a better surface and is superficial. 7/8;  patient called in this morning to say that his total contact cast was rubbing against his foot. He states he is doing fine overall. He denies signs of infection. 7/12; continued deterioration in the wound over the right fifth metatarsal head crumbling bone. This is not going to be salvageable. The patient agrees and wants to be referred to Dr. Doran Durand which we will attempt to arrange as soon as possible. I am going to continue him on antibiotics as long as that takes so I will renew those today. The area on the left foot which is the base of the fifth metatarsal continues to look somewhat better. Healthy looking tissue no depth no debridement is necessary here. 7/20; the patient was kindly seen by Dr. Doran Durand of orthopedics on 10/19/2020. 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 Towards  this end we have taken him out of the total contact cast on the left we will put him in a wrap with Hydrofera Blue. As I understand things surgery is planned for 7/21 7/27; patient had his surgery last Thursday. 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 Apparently everything went well we did not still disturb that today The area on the left foot actually looks quite good. He has been much less mobile which probably explains this he did not seem to do well in the total contact cast secondary to drainage and maceration I think. 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 Fortunately there is no evidence of active infection at this time. No fevers, chills, nausea, vomiting, or diarrhea. Overall I think that he is actually doing extremely well. Nonetheless I do believe that he is staying off of this more following the surgery in his right foot that is the reason the left is doing so great. 8/16; left plantar foot wound. This looks smaller than the last time I saw this he is using Hydrofera Blue. The surgical wound on the right foot is being followed by Dr. Doran Durand we did not look at this today. He has surgical shoes on both feet 8/23; left plantar foot wound not as good this week. Surrounding macerated skin and subcutaneous tissue everything looks moist and wet. I do not think he is offloading this adequately. He is using a surgical shoe Apparently the right foot surgical wound is not open although I did not check his foot 8/31; left plantar foot lateral aspect. Much improved this week. He has no maceration. Some improvement in the surface area of the wound but most impressively the depth is come in we are using silver alginate. The patient is a Product/process development scientist. He is asked that we write him a letter so he can go back to work. I have also tried to see if we can write something that will  allow him to limit the amount of time that he is on his foot at work. 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 Hopefully the latter can be avoided 9/6; I believe the patient missed an appointment last week. He arrives in today with a wound looking roughly the same certainly no better. Undermining laterally and also inferiorly. We used molecuLight today in training with the patient's permission.Marland Kitchen  We are using silver alginate 9/21 wound is measuring bigger this week although this may have to do with the aggressive circumferential debridement last week in response to the blush fluorescence on the MolecuLight. Culture I did last week showed significant MSSA and E. coli. I put him on Augmentin but he has not started it yet. We are also going to send this for compounded antibiotics at [facility]. There is no evidence of systemic infection 9/29; silver alginate. His Keystone arrived. He is completing Augmentin in 2 days. Offloading in a cam boot. Moderate drainage per our intake staff 10/5; using silver alginate. He has been using his Morgan. He has completed his Augmentin. Per our intake nurse still a lot of drainage, far too much to consider a total contact cast. Wound measures about the same. He had the same undermining area that I defined last week from a roughly 11-3. I remove this today 10/12; using silver alginate he is using the [place]. He comes in for a nurse visit hence we are applying [employer] twice a week. Measuring slightly better today and less notable drainage. Extensive debridement of the wound edge last time 10/18; using topical Keystone and silver alginate and a soft cast. Wound measurements about the same. Drainage was through his soft cast. We are changing this twice a week Tuesdays and Fridays Electronic Signature(s) Signed: 01/19/2021 4:56:56 PM By: [name] MD Entered By: [name] on  01/19/2021 08:30:27 -------------------------------------------------------------------------------- Soft Cast Details Patient Name: Date of Service: [name], [name] 01/19/2021 7:30 A M Medical Record Number: [id number] Patient Account Number: [id number] Date of Birth/Sex: Treating RN: 06/05/1985 (35 y.o. [name] Primary Care Provider: [name] Other Clinician: Referring Provider: Treating Provider/Extender: [name] in Treatment: 53 Procedure Performed for: Wound #3 Left,Lateral Foot Performed By: Clinician [name], RN Post Procedure Diagnosis Same as Pre-procedure Electronic Signature(s) Signed: 01/19/2021 4:32:39 PM By: [name] Signed: 01/19/2021 4:56:56 PM By: [name] MD Entered By: [name] on 01/19/2021 08:26:18 -------------------------------------------------------------------------------- Physical Exam Details Patient Name: Date of Service: [name], [name] 01/19/2021 7:30 A M Medical Record Number: [id number] Patient Account Number: [id number] Date of Birth/Sex: Treating RN: 06-28-1985 (35 y.o. [name] Primary Care Provider: [name] Other Clinician: Referring Provider: Treating Provider/Extender: [name] in Treatment: 64 Constitutional Sitting or standing Blood Pressure is within target range for patient.. Pulse regular and within target range for patient.Marland Kitchen Respirations regular, non-labored and within target range.. Temperature is normal and within the target range for the patient.Marland Kitchen Appears in no distress. Cardiovascular Pedal pulses palpable. Notes Wound exam; left plantar foot base of the fifth metatarsal. Still raised edges callused thick skin. I used a #5 curette to knock this area back. I did not debride the actual surface of the wound which actually looks reasonably healthy. There is no evidence of surrounding infection no erythema Electronic  Signature(s) Signed: 01/19/2021 4:56:56 PM By: [name] MD Entered By: [name] on 01/19/2021 08:31:31 -------------------------------------------------------------------------------- Physician Orders Details Patient Name: Date of Service: [name], [name] 01/19/2021 7:30 A M Medical Record Number: [id number] Patient Account Number: [id number] Date of Birth/Sex: Treating RN: [birth year] (35 y.o. [name] Primary Care Provider: [name] Other Clinician: Referring Provider: Treating Provider/Extender: [name] in Treatment: 80 Verbal / Phone Orders: No Diagnosis Coding ICD-10 Coding Code Description E11.621 Type 2 diabetes mellitus with foot ulcer L97.528 Non-pressure chronic ulcer of other part of  left foot with other specified severity Follow-up Appointments ppointment in 1 week. - with Dr. Dellia Nims Return A Nurse Visit: - Friday 10/21 to change soft cast Other: - Moleculight next week, possible TCC next week Edema Control - Lymphedema / SCD / Other Bilateral Lower Extremities Elevate legs to the level of the heart or above for 30 minutes daily and/or when sitting, a frequency of: - throughout the day Avoid standing for long periods of time. Exercise regularly Moisturize legs daily. - right leg every night before bed. Compression stocking or Garment 20-30 mm/Hg pressure to: - Apply to right leg in the morning and remove at night. Off-Loading Open toe surgical shoe to: - Left Foot with soft cast Other: - minimal weight bearing left foot Additional Orders / Instructions Follow Nutritious Diet Wound Treatment Wound #3 - Foot Wound Laterality: Left, Lateral Cleanser: Soap and Water 2 x Per Week/30 Days Discharge Instructions: May shower and wash wound with dial antibacterial soap and water prior to dressing change. Cleanser: Wound Cleanser (Generic) 2 x Per Week/30 Days Discharge Instructions: Cleanse the wound with wound  cleanser prior to applying a clean dressing using gauze sponges, not tissue or cotton balls. Peri-Wound Care: Zinc Oxide Ointment 30g tube 2 x Per Week/30 Days Discharge Instructions: Apply Zinc Oxide to periwound with each dressing change as needed. Topical: keystone antibiotic compound 2 x Per Week/30 Days Discharge Instructions: thin layer to wound bed Prim Dressing: KerraCel Ag Gelling Fiber Dressing, 4x5 in (silver alginate) (Generic) 2 x Per Week/30 Days ary Discharge Instructions: Apply silver alginate to wound bed as instructed Secondary Dressing: Woven Gauze Sponge, Non-Sterile 4x4 in (Generic) 2 x Per Week/30 Days Discharge Instructions: Apply over primary dressing as directed. Secondary Dressing: Zetuvit Plus 4x8 in (Generic) 2 x Per Week/30 Days Discharge Instructions: Apply over primary dressing as directed. Secondary Dressing: Optifoam Non-Adhesive Dressing, 4x4 in (Generic) 2 x Per Week/30 Days Discharge Instructions: Apply over primary dressing as directed. Secured With: Coban Self-Adherent Wrap 4x5 (in/yd) (Generic) 2 x Per Week/30 Days Discharge Instructions: Secure with Coban as directed. Secured With: [employer], 4.5x3.1 (in/yd) (Generic) 2 x Per Week/30 Days Discharge Instructions: Secure with Kerlix as directed. Secured With: 95M Medipore H Soft Cloth Surgical T 4 x 2 (in/yd) (Generic) 2 x Per Week/30 Days ape Discharge Instructions: Secure dressing with tape as directed. Add-Ons: Soft Cast 2 x Per Week/30 Days Electronic Signature(s) Signed: 01/19/2021 4:32:39 PM By: [name] Signed: 01/19/2021 4:56:56 PM By: [name] MD Entered By: [name] on 01/19/2021 08:28:00 -------------------------------------------------------------------------------- Problem List Details Patient Name: Date of Service: [name], [name] 01/19/2021 7:30 A M Medical Record Number: [id number] Patient Account Number: [id number] Date of Birth/Sex: Treating  RN: 18-Jan-1986 (35 y.o. [name] Primary Care Provider: [name] Other Clinician: Referring Provider: Treating Provider/Extender: [name] in Treatment: 64 Active Problems ICD-10 Encounter Code Description Active Date MDM Diagnosis E11.621 Type 2 diabetes mellitus with foot ulcer 10/24/2019 No Yes L97.528 Non-pressure chronic ulcer of other part of left foot with other specified 10/24/2019 No Yes severity Inactive Problems ICD-10 Code Description Active Date Inactive Date L97.518 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ity 10/24/2019 10/24/2019 L97.518 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ity 07/14/2020 07/14/2020 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, right ankle and foot 10/24/2019 10/24/2019 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 08/10/2020 08/10/2020 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,  left ankle and foot 10/24/2019 10/24/2019 B95.62 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 10/24/2019 10/24/2019 classified elsewhere Resolved Problems Electronic Signature(s) Signed: 01/19/2021 4:56:56 PM By: [name] MD Entered By: [name] on 01/19/2021 08:28:05 -------------------------------------------------------------------------------- Progress Note Details Patient Name: Date of Service: [name], [name] 01/19/2021 7:30 A M Medical Record Number: [id number] Patient Account Number: [id number] Date of Birth/Sex: Treating RN: 1985-04-05 (35 y.o. 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 He  comes in for review of a wound over the plantar fifth metatarsal head extending into the lateral part of the foot. He was followed for this previously by his podiatrist Dr. Cornelius Moras. As the patient tells his story he went to see podiatry first for a swelling he developed on the lateral part of his fifth metatarsal head in May. He states this was "open" by podiatry and the area closed. He was followed up in June and it was again opened callus removed and it closed promptly. There were plans being made for surgery on the fifth metatarsal head in June however his blood sugar was apparently too high for anesthesia. Apparently the area was debrided and opened again in June and it is never closed since. Looking over the records from podiatry I am really not able to follow this. It was clear when he was first seen it was before 5/14 at that point he already had a wound. By 5/17 the ulcer was resolved. I do not see anything about a procedure. On 5/28 noted to have pre-ulcerative moderate keratosis. X-ray noted 1/5 contracted toe and tailor's bunion and metatarsal deformity. On a visit date on 09/28/2018 the dorsal part of the left foot it healed and resolved. There was concern about swelling in his lower extremity he was sent to the ER.. As far as I can tell he was seen in the ER on 7/12 with an ulcer on his left foot. A DVT rule out of the left leg was negative. I do not think I have complete records from podiatry but I am not able to verify the procedures this patient states he had. He states after the last procedure the wound has never closed although I am not able to follow this in the records I have from podiatry. He has not had a recent x-ray The patient has been using Neosporin on the wound. He is wearing a Darco shoe. He is still very active up on his foot working and exercising. Past medical history; type 2 diabetes ketosis-prone, leg swelling with a negative DVT study in July. Non-smoker ABI in our  clinic was 0.85 on the left 10/16; substantial wound on the plantar left fifth met head extending laterally almost to the dorsal fifth MTP. We have been using silver alginate we gave him a Darco forefoot off loader. An x-ray did not show evidence of osteomyelitis did note soft tissue emphysema which I think was due to gas tracking through an open wound. There is no doubt in my mind he requires an MRI 10/23; MRI not booked until 3 November at the earliest this is largely due to his glucose sensor in the right arm. We have been using silver alginate. There has been an improvement 10/29; I am still not exactly sure when his  MRI is booked for. He says it is the third but it is the 10th in epic. This definitely needs to be done. He is running a low-grade fever today but no other symptoms. No real improvement in the 1 02/26/2019 patient presents today for a follow-up visit here in our clinic he is last been seen in the clinic on October 29. Subsequently we were working on getting MRI to evaluate and see what exactly was going on and where we would need to go from the standpoint of whether or not he had osteomyelitis and again what treatments were going be required. Subsequently the patient ended up being admitted to the hospital on 02/07/2019 and was discharged on 02/14/2019. This is a somewhat interesting admission with a discharge diagnosis of pneumonia due to COVID-19 although he was positive for COVID-19 when tested at the urgent care but negative x2 when he was actually in the hospital. With that being said he did have acute respiratory failure with hypoxia and it was noted he also have a left foot ulceration with osteomyelitis. With that being said he did require oxygen for his pneumonia and I level 4 L. He was placed on antivirals and steroids for the COVID-19. He was also transferred to the [facility] at one point. Nonetheless he did subsequently discharged home and since being home has done  much better in that regard. The CT angiogram did not show any pulmonary embolism. With regard to the osteomyelitis the patient was placed on vancomycin and Zosyn while in the hospital but has been changed to Augmentin at discharge. It was also recommended that he follow- up with wound care and podiatry. Podiatry however wanted him to see [name] according to the patient prior to them doing anything further. His hemoglobin A1c was 9.9 as noted in the hospital. Have an MRI of the left foot performed while in the hospital on 02/04/2019. 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 There is an overlying plantar open wound noted an abscess tracking back along the lateral aspect of the fifth metatarsal shaft. There is otherwise diffuse cellulitis and mild fasciitis without findings of polymyositis. The patient did have recently pneumonia secondary to COVID-19 I looked in the chart through epic and it does appear that the patient may need to have an additional x-ray just to ensure everything is cleared and that he has no airspace disease prior to putting him into the [name]. 03/05/2019; patient was readmitted to the clinic last week. He was hospitalized twice for a viral upper respiratory tract infection from 11/1 through 11/4 and then 11/5 through 11/12 ultimately this turned out to be Covid pneumonitis. Although he was discharged on oxygen he is not using it. He says he feels fine. He has no exercise limitation no cough no sputum. His O2 sat in our clinic today was 100% on room air. 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 He received Vanco and Zosyn in the hospital and then was discharged on 2 weeks of Augmentin. I do not see any relevant cultures. He was supposed to follow-up with infectious disease but  I do not see that he has an appointment. 12/8; patient saw Dr. Novella Olive of infectious disease last week. He felt that he had had adequate antibiotic therapy. He did not go to follow-up with  Dr. Amalia Hailey of podiatry and I have again talked to him about the pros and cons of this. He does not want to consider a ray amputation of this time. He is aware of the risks of recurrence, migration etc. He started HBO today and tolerated this well. He can complete the Augmentin that I gave him last week. I have looked over the lab work that Dr. Chana Bode ordered his C-reactive protein was 3.3 and his sedimentation rate was 17. The C-reactive protein is never really been measurably that high in this patient 12/15; not much change in the wound today however he has undermining along the lateral part of the foot again more extensively than last week. He has some rims of epithelialization. We have been using silver alginate. He is undergoing hyperbarics but did not dive today 12/18; in for his obligatory first total contact cast change. Unfortunately there was pus coming from the undermining area around his fifth metatarsal head. This was cultured but will preclude reapplication of a cast. He is seen in conjunction with HBO 12/24; patient had staph lugdunensis in the wound in the undermining area laterally last time. We put him on doxycycline which should have covered this. The wound looks better today. I am going to give him another week of doxycycline before reattempting the total contact cast 12/31; the patient is completing antibiotics. Hemorrhagic debris in the distal part of the wound with some undermining distally. He also had hyper granulation. Extensive debridement with a #5 curette. The infected area that was on the lateral part of the fifth met head is closed over. I do not think he needs any more antibiotics. Patient was seen prior to HBO. Preparations for a total contact cast were made in the cast will be placed  post hyperbarics 04/11/19; once again the patient arrives today without complaint. He had been in a cast all week noted that he had heavy drainage this week. This resulted in large raised areas of macerated tissue around the wound 1/14; wound bed looks better slightly smaller. Hydrofera Blue has been changing himself. He had a heavy drainage last week which caused a lot of maceration around the wound so I took him out of a total contact cast he says the drainage is actually better this week He is seen today in conjunction with HBO 1/21; returns to clinic. He was up in [place] for a day or 2 attending a funeral. He comes back in with the wound larger and with a large area of exposed bone. He had osteomyelitis and septic arthritis of the fifth left metatarsal head while he was in hospital. He received IV antibiotics in the hospital for a prolonged period of time then 3 weeks of Augmentin. Subsequently I gave him 2 weeks of doxycycline for more superficial wound infection. When I saw this last week the wound was smaller the surface of the wound looks satisfactory. 1/28; patient missed hyperbarics today. Bone biopsy I did last time showed Enterococcus faecalis and Staphylococcus lugdunensis . He has a wide area of exposed bone. We are going to use silver alginate as of today. I had another ethical discussion with the patient. 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 In this situation I think the likelihood of healing this is low. Therefore I have recommended a ray amputation and with the patient's agreement I have referred him to Dr. Doran Durand. The other issue is that his compliance with hyperbarics has been minimal because of his work schedule  and given his underlying decision I am going to stop this today READMISSION 10/24/2019 MRI 09/29/2019 left foot IMPRESSION: 1. Apparent skin ulceration inferior and lateral to the 5th metatarsal base with underlying heterogeneous  T2 signal and enhancement in the subcutaneous fat. Small peripherally enhancing fluid collections along the plantar and lateral aspects of the 5th metatarsal base suspicious for abscesses. 2. Interval amputation through the mid 5th metatarsal with nonspecific low-level marrow edema and enhancement. Given the proximity to the adjacent soft tissue inflammatory changes, osteomyelitis cannot be excluded. 3. The additional bones appear unremarkable. MRI 09/29/2019 right foot IMPRESSION: 1. Soft tissue ulceration lateral to the 5th MTP joint. There is low-level T2 hyperintensity within the 4th and 5th metatarsal heads and adjacent proximal phalanges without abnormal T1 signal or cortical destruction. These findings are nonspecific and could be seen with early marrow edema, hyperemia or early osteomyelitis. No evidence of septic joint. 2. Mild tenosynovitis and synovial enhancement associated with the extensor digitorum tendons at the level of the midfoot. 3. Diffuse low-level muscular T2 hyperintensity and enhancement, most consistent with diabetic myopathy. LEFT FOOT BONE Methicillin resistant staphylococcus aureus Staphylococcus lugdunensis MIC MIC CIPROFLOXACIN >=8 RESISTANT Resistant <=0.5 SENSI... Sensitive CLINDAMYCIN <=0.25 SENS... Sensitive >=8 RESISTANT Resistant ERYTHROMYCIN >=8 RESISTANT Resistant >=8 RESISTANT Resistant GENTAMICIN <=0.5 SENSI... Sensitive <=0.5 SENSI... Sensitive Inducible Clindamycin NEGATIVE Sensitive NEGATIVE Sensitive OXACILLIN >=4 RESISTANT Resistant 2 SENSITIVE Sensitive RIFAMPIN <=0.5 SENSI... Sensitive <=0.5 SENSI... Sensitive TETRACYCLINE <=1 SENSITIVE Sensitive <=1 SENSITIVE Sensitive TRIMETH/SULFA <=10 SENSIT Sensitive <=10 SENSIT Sensitive ... Marland Kitchen.. VANCOMYCIN 1 SENSITIVE Sensitive <=0.5 SENSI... Sensitive Right foot bone . Component 3 wk ago Specimen Description BONE Special Requests RIGHT 4 METATARSAL SAMPLE B Gram Stain NO WBC SEEN NO  ORGANISMS SEEN Culture RARE METHICILLIN RESISTANT STAPHYLOCOCCUS AUREUS NO ANAEROBES ISOLATED Performed at [facility], [place] [address] [place], [place] [address] Report Status 10/08/2019 FINAL Organism ID, Bacteria METHICILLIN RESISTANT STAPHYLOCOCCUS AUREUS Resulting Agency [facility] Susceptibility Methicillin resistant staphylococcus aureus MIC CIPROFLOXACIN >=8 RESISTANT Resistant CLINDAMYCIN <=0.25 SENS... Sensitive ERYTHROMYCIN >=8 RESISTANT Resistant GENTAMICIN <=0.5 SENSI... Sensitive Inducible Clindamycin NEGATIVE Sensitive OXACILLIN >=4 RESISTANT Resistant RIFAMPIN <=0.5 SENSI... Sensitive TETRACYCLINE <=1 SENSITIVE Sensitive TRIMETH/SULFA <=10 SENSIT Sensitive ... VANCOMYCIN 1 SENSITIVE Sensitive This is a patient we had in clinic earlier this year with a wound over his left fifth metatarsal head. 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 Doran Durand on 05/16/2019. He seems to have developed problems with wounds on his bilateral feet in June 2021 although he may have had problems earlier than this. He was in an urgent care with a right foot ulcer on 09/26/2019 and given a course of doxycycline. This was apparently after having trouble getting into see orthopedics. He was seen by podiatry on 09/28/2019 noted to have bilateral lower extremity ulcers including the left lateral fifth metatarsal base and the right subfifth met head. It was noted that had purulent drainage at that time. He required hospitalization from 6/20 through 7/2. This was because of worsening right foot wounds. He underwent bilateral operative incision and drainage and bone biopsies bilaterally. Culture results are listed above.  He has been referred back to clinic by Dr. Jacqualyn Posey of podiatry. He is also followed by Dr. Megan Salon who saw him yesterday.  He was discharged from hospital on Zyvox Flagyl and Levaquin and yesterday changed to doxycycline Flagyl and Levaquin. His inflammatory markers on 6/26 showed a sedimentation rate of 129 and a C-reactive protein of 5. This is improved to 14 and 1.3 respectively. This would indicate improvement. ABIs in our clinic today were 1.23 on the right and 1.20 on the left 11/01/2019 on evaluation today patient appears to be doing fairly well in regard to the wounds on his feet at this point. Fortunately there is no signs of active infection at this time. No fevers, chills, nausea, vomiting, or diarrhea. He currently is seeing infectious disease and still under their care at this point. Subsequently he also has both wounds which she has not been using collagen on as he did not receive that in his packaging he did not call us and let us know that. Apparently that just was missed on the order. Nonetheless we will get that straightened out today. 8/9-Patient returns for bilateral foot wounds, using Prisma with hydrogel moistened dressings, and the wounds appear stable. Patient using surgical shoes, avoiding much pressure or weightbearing as much as possible 8/16; patient has bilateral foot wounds. 1 on the right lateral foot proximally the other is on the left mid lateral foot. Both required debridement of callus and thick skin around the wounds. We have been using silver collagen 8/27; patient has bilateral lateral foot wounds. The area on the left substantially surrounded by callus and dry skin. This was removed from the wound edge. The underlying wound is small. The area on the right measured somewhat smaller today. 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 Unfortunately I did not update his antibiotics during today's  visit. 9/10 I reviewed Dr. Hale Bogus last notes he felt he had completed antibiotics his inflammatory markers were reasonably well controlled. He has a small wound on the lateral left foot and a tiny area on the right which is just above closed. He is using Hydrofera Blue with border foam he has bilateral surgical shoes 9/24; 2 week f/u. doing well. right foot is closed. left foot still undermined. 10/14; right foot remains closed at the fifth met head. The area over the base of the left fifth metatarsal has a small open area but considerable undermining towards the plantar foot. Thick callus skin around this suggests an adequate pressure relief. We have talked about this. He says he is going to go back into his cam boot. I suggested a total contact cast he did not seem enamored with this suggestion 10/26; left foot base of the fifth metatarsal. Same condition as last time. He has skin over the area with an open wound however the skin is not adherent. He went to see Dr. Earleen Newport who did an x-ray and culture of his foot I have not reviewed the x-ray but the patient was not told anything. He is on doxycycline 11/11; since the patient was last here he was in the emergency room on 10/30 he was concerned about swelling in the left foot. They did not do any cultures or x-rays. They changed his antibiotics to cephalexin. Previous culture showed group B strep. The cephalexin is appropriate as doxycycline has less than predictable coverage. Arrives in clinic today with swelling over this area under the wound. He also has a new wound on the right fifth metatarsal head 11/18; the patient has a difficult wound on the lateral aspect of the left fifth metatarsal  head. The wound was almost ballotable last week I opened it slightly expecting to see purulence however there was just bleeding. I cultured this this was negative. X-ray unchanged. We are trying to get an MRI but I am not sure were going to be able to get  this through his insurance. He also has an area on the right lateral fifth metatarsal head this looks healthier 12/3; the patient finally got our MRI. Surprisingly this did not show osteomyelitis. I did show the soft tissue ulceration at the lateral plantar aspect of the fifth metatarsal base with a tiny residual 6 mm abscess overlying the superficial fascia I have tried to culture this area I have not been able to get this to grow anything. Nevertheless the protruding tissue looks aggravated. I suspect we should try to treat the underlying "abscess with broad-spectrum antibiotics. I am going to start him on Levaquin and Flagyl. He has much less edema in his legs and I am going to continue to wrap his legs and see him weekly 12/10. I started Levaquin and Flagyl on him last week. He just picked up the Flagyl apparently there was some delay. The worry is the wound on the left fifth metatarsal base which is substantial and worsening. His foot looks like he inverts at the ankle making this a weightbearing surface. Certainly no improvement in fact I think the measurements of this are somewhat worse. We have been using 12/17; he apparently just got the Levaquin yesterday this is 2 weeks after the fact. He has completed the Flagyl. The area over the left fifth metatarsal base still has protruding granulation tissue although it does not look quite as bad as it did some weeks ago. He has severe bilateral lymphedema although we have not been treating him for wounds on his legs this is definitely going to require compression. There was so much edema in the left I did not wish to put him in a total contact cast today. I am going to increase his compression from 3-4 layer. The area on the right lateral fifth met head actually look quite good and superficial. 12/23; patient arrived with callus on the right fifth met head and the substantial hyper granulated callused wound on the base of his fifth metatarsal. He  says he is completing his Levaquin in 2 days but I do not think that adds up with what I gave him but I will have to double check this. We are using Hydrofera Blue on both areas. My plan is to put the left leg in a cast the week after New Year's 04/06/2020; patient's wounds about the same. Right lateral fifth metatarsal head and left lateral foot over the base of the fifth metatarsal. There is undermining on the left lateral foot which I removed before application of total contact cast continuing with Hydrofera Blue new. Patient tells me he was seen by endocrinology today lab work was done [Dr. Kerr]. Also wondering whether he was referred to cardiology. I went over some lab work from previously does not have chronic renal failure certainly not nephrotic range proteinuria he does have very poorly controlled diabetes but this is not his most updated lab work. Hemoglobin A1c has been over 11 1/10; the patient had a considerable amount of leakage towards mid part of his left foot with macerated skin however the wound surface looks better the area on the right lateral fifth met head is better as well. I am going to change the dressing on the  left foot under the total contact cast to silver alginate, continue with Hydrofera Blue on the right. 1/20; patient was in the total contact cast for 10 days. Considerable amount of drainage although the skin around the wound does not look too bad on the left foot. The area on the right fifth metatarsal head is closed. Our nursing staff reports large amount of drainage out of the left lateral foot wound 1/25; continues with copious amounts of drainage described by our intake staff. PCR culture I did last week showed E. coli and Enterococcus faecalis and low quantities. Multiple resistance genes documented including extended spectrum beta lactamase, MRSA, MRSE, quinolone, tetracycline. The wound is not quite as good this week as it was 5 days ago but about the same  size 2/3; continues with copious amounts of malodorous drainage per our intake nurse. The PCR culture I did 2 weeks ago showed E. coli and low quantities of Enterococcus. There were multiple resistance genes detected. I put Neosporin on him last week although this does not seem to have helped. The wound is slightly deeper today. Offloading continues to be an issue here although with the amount of drainage she has a total contact cast is just not going to work 2/10; moderate amount of drainage. Patient reports he cannot get his stocking on over the dressing. I told him we have to do that the nurse gave him suggestions on how to make this work. The wound is on the bottom and lateral part of his left foot. Is cultured predominantly grew low amounts of Enterococcus, E. coli and anaerobes. There were multiple resistance genes detected including extended spectrum beta lactamase, quinolone, tetracycline. I could not think of an easy oral combination to address this so for now I am going to do topical antibiotics provided by [facility] I think the main agents here are vancomycin and an aminoglycoside. We have to be able to give him access to the wounds to get the topical antibiotic on 2/17; moderate amount of drainage this is unchanged. He has his Keystone topical antibiotic against the deep tissue culture organisms. He has been using this and changing the dressing daily. Silver alginate on the wound surface. 2/24; using Keystone antibiotic with silver alginate on the top. He had too much drainage for a total contact cast at one point although I think that is improving and I think in the next week or 2 it might be possible to replace a total contact cast I did not do this today. In general the wound surface looks healthy however he continues to have thick rims of skin and subcutaneous tissue around the wide area of the circumference which I debrided 06/04/2020 upon evaluation today patient appears to be doing well  in regard to his wound. I do feel like he is showing signs of improvement. There is little bit of callus and dead tissue around the edges of the wound as well as what appears to be a little bit of a sinus tract that is off to the side laterally I would perform debridement to clear that away today. 3/17; left lateral foot. The wound looks about the same as I remember. Not much depth surface looks healthy. No evidence of infection 3/25; left lateral foot. Wound surface looks about the same. Separating epithelium from the circumference. There really is no evidence of infection here however not making progress by my view 3/29; left lateral foot. Surface of the wound again looks reasonably healthy still thick skin and subcutaneous  tissue around the wound margins. There is no evidence of infection. One of the concerns being brought up by the nurses has again the amount of drainage vis--vis continued use of a total contact cast 4/5; left lateral foot at roughly the base of the fifth metatarsal. Nice healthy looking granulated tissue with rims of epithelialization. The overall wound measurements are not any better but the tissue looks healthy. The only concern is the amount of drainage although he has no surrounding maceration with what we have been doing recently to absorb fluid and protect his skin. He also has lymphedema. 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 He states that the kerlix/coban under the TCC rolled down and could not get it back up. He has been using an offloading scooter and has somehow hurt his right foot using this device. This happened last week. He states that the side of his right foot developed a blister and opened. The top of his foot  also has a few small open wounds he thinks is due to his socks rubbing in his shoes. He has not been using any dressings to the wound. He denies purulent drainage, fever/chills or erythema to the wounds. 4/22; patient presents for 1 week follow-up. He developed new wounds to the right foot that were evaluated at last clinic visit. He continues to have a total contact cast to the left leg and he reports no issues. He has been using silver collagen to the right foot wounds with no issues. He denies purulent drainage, fever/chills or erythema to the right foot wounds. He has no complaints today 4/25; patient presents for 1 week follow-up. He has a total contact cast of the left leg and reports no issues. He has been using silver alginate to the right foot wound. He denies purulent drainage, fever/chills or erythema to the right foot wounds. 5/2 patient presents for 1 week follow-up. T contact cast on the left. The wound which is on the base of the plantar foot at the base of the fifth metatarsal otal actually looks quite good and dimensions continue to gradually contract. HOWEVER the area on the right lateral fifth metatarsal head is much larger than what I remember from 2 weeks ago. Once more is he has significant levels of hypergranulation. Noteworthy that he had this same hyper granulated response on his wound on the left foot at one point in time. So much so that he I thought there was an underlying fluid collection. Based on this I think this just needs debridement. 5/9; the wound on the left actually continues to be gradually smaller with a healthy surface. Slight amount of drainage and maceration of the skin around but not too bad. However he has a large wound over the right fifth metatarsal head very much in the same configuration as his left foot wound was initially. I used silver nitrate to address the hyper granulated tissue no mechanical debridement 5/16; area on the left foot did not look  as healthy this week deeper thick surrounding macerated skin and subcutaneous tissue. oo The area on the right foot fifth met head was about the same oo The area on the right ankle that we identified last week is completely broken down into  an open wound presumably a stocking rubbing issue 5/23; patient has been using a total contact cast to the left side. He has been using silver alginate underneath. He has also been using silver alginate to the right foot wounds. He has no complaints today. He denies any signs of infection. 5/31; the left-sided wound looks some better measure smaller surface granulation looks better. We have been using silver alginate under the total contact cast oo The large area on his right fifth met head and right dorsal foot look about the same still using silver alginate 6/6; neither side is good as I was hoping although the surface area dimensions are better. A lot of maceration on his left and right foot around the wound edge. Area on the dorsal right foot looks better. He says he was traveling. I am not sure what does the amount of maceration around the plantar wounds may be drainage issues 6/13; in general the wound surfaces look quite good on both sides. Macerated skin and raised edges around the wound required debridement although in general especially on the left the surface area seems improved. oo The area on the right dorsal ankle is about the same I thought this would not be such a problem to close 6/20; not much change in either wound although the one on the right looks a little better. Both wounds have thick macerated edges to the skin requiring debridements. We have been using silver alginate. The area on the dorsal right ankle is still open I thought this would be closed. 6/28; patient comes in today with a marked deterioration in the right foot wound fifth met head. Wide area of exposed bone this is a drastic change from last time. The area on the left  there we have been casting is stagnant. We have been using silver alginate in both wound areas. 7/5; bone culture I did for PCR last time was positive for Pseudomonas, group B strep, Enterococcus and Staph aureus. There was no suggestion of methicillin resistance or ampicillin resistant genes. This was resistant to tetracycline however He comes into the clinic today with the area over his right plantar fifth metatarsal head which had been doing so well 2 weeks ago completely necrotic feeling bone. I do not know that this is going to be salvageable. The left foot wound is certainly no smaller but it has a better surface and is superficial. 7/8; patient called in this morning to say that his total contact cast was rubbing against his foot. He states he is doing fine overall. He denies signs of infection. 7/12; continued deterioration in the wound over the right fifth metatarsal head crumbling bone. This is not going to be salvageable. The patient agrees and wants to be referred to Dr. Doran Durand which we will attempt to arrange as soon as possible. I am going to continue him on antibiotics as long as that takes so I will renew those today. The area on the left foot which is the base of the fifth metatarsal continues to look somewhat better. Healthy looking tissue no depth no debridement is necessary here. 7/20; the patient was kindly seen by Dr. Doran Durand of orthopedics on 10/19/2020. 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 Towards this end we have taken him out of the total contact cast on the left we will put him in a wrap with Hydrofera Blue. As  I understand things surgery is planned for 7/21 7/27; patient had his surgery last Thursday. 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 Apparently everything went well we did not still disturb that today The area on the left foot actually looks quite good. He has been much  less mobile which probably explains this he did not seem to do well in the total contact cast secondary to drainage and maceration I think. 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 Fortunately there is no evidence of active infection at this time. No fevers, chills, nausea, vomiting, or diarrhea. Overall I think that he is actually doing extremely well. Nonetheless I do believe that he is staying off of this more following the surgery in his right foot that is the reason the left is doing so great. 8/16; left plantar foot wound. This looks smaller than the last time I saw this he is using Hydrofera Blue. The surgical wound on the right foot is being followed by Dr. Doran Durand we did not look at this today. He has surgical shoes on both feet 8/23; left plantar foot wound not as good this week. Surrounding macerated skin and subcutaneous tissue everything looks moist and wet. I do not think he is offloading this adequately. He is using a surgical shoe Apparently the right foot surgical wound is not open although I did not check his foot 8/31; left plantar foot lateral aspect. Much improved this week. He has no maceration. Some improvement in the surface area of the wound but most impressively the depth is come in we are using silver alginate. The patient is a Product/process development scientist. He is asked that we write him a letter so he can go back to work. I have also tried to see if we can write something that will allow him to limit the amount of time that he is on his foot at work. 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 Hopefully the latter can be avoided 9/6; I believe the patient missed an appointment last week. He arrives in today with a wound looking roughly the same certainly no better. Undermining laterally and  also inferiorly. We used molecuLight today in training with the patient's permission.. We are using silver alginate 9/21 wound is measuring bigger this week although this may have to do with the aggressive circumferential debridement last week in response to the blush fluorescence on the MolecuLight. Culture I did last week showed significant MSSA and E. coli. I put him on Augmentin but he has not started it yet. We are also going to send this for compounded antibiotics at [facility]. There is no evidence of systemic infection 9/29; silver alginate. His Keystone arrived. He is completing Augmentin in 2 days. Offloading in a cam boot. Moderate drainage per our intake staff 10/5; using silver alginate. He has been using his [place]. He has completed his Augmentin. Per our intake nurse still a lot of drainage, far too much to consider a total contact cast. Wound measures about the same. He had the same undermining area that I defined last week from a roughly 11-3. I remove this today 10/12; using silver alginate he is using the [place]. He comes in for a nurse visit hence we are applying [employer] twice a week. Measuring slightly better today and less  notable drainage. Extensive debridement of the wound edge last time 10/18; using topical Keystone and silver alginate and a soft cast. Wound measurements about the same. Drainage was through his soft cast. We are changing this twice a week Tuesdays and Fridays Objective Constitutional Sitting or standing Blood Pressure is within target range for patient.. Pulse regular and within target range for patient.Marland Kitchen Respirations regular, non-labored and within target range.. Temperature is normal and within the target range for the patient.Marland Kitchen Appears in no distress. Vitals Time Taken: 7:56 AM, Height: 77 in, Weight: 280 lbs, BMI: 33.2, Temperature: 98.5 F, Pulse: 90 bpm, Respiratory Rate: 18 breaths/min, Blood Pressure: 142/89 mmHg, Capillary Blood Glucose: 109  mg/dl. Cardiovascular Pedal pulses palpable. General Notes: Wound exam; left plantar foot base of the fifth metatarsal. Still raised edges callused thick skin. I used a #5 curette to knock this area back. I did not debride the actual surface of the wound which actually looks reasonably healthy. There is no evidence of surrounding infection no erythema Integumentary (Hair, Skin) Wound #3 status is Open. Original cause of wound was Trauma. The date acquired was: 10/02/2019. The wound has been in treatment 64 weeks. The wound is located on the Left,Lateral Foot. The wound measures 3.6cm length x 3.3cm width x 0.4cm depth; 9.331cm^2 area and 3.732cm^3 volume. There is Fat Layer (Subcutaneous Tissue) exposed. There is undermining starting at 5:00 and ending at 1:00 with a maximum distance of 0.4cm. There is a medium amount of serosanguineous drainage noted. The wound margin is distinct with the outline attached to the wound base. There is large (67-100%) red, pink granulation within the wound bed. There is no necrotic tissue within the wound bed. 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 There was a Selective/Open Wound Skin/Dermis Debridement with a total area of 11.88 sq cm performed by [name], MD. With the following instrument(s): Curette, Silver Nitrate to remove Non-Viable tissue/material. Material removed includes Callus and Skin: Dermis and after achieving pain control using Other (Benzocaine). No specimens were taken. A time out was conducted at 08:19, prior to the start of the procedure. A Minimum amount of bleeding was controlled with  Pressure. The procedure was tolerated well. Post Debridement Measurements: 3.6cm length x 3.3cm width x 0.4cm depth; 3.732cm^3 volume. Character of Wound/Ulcer Post Debridement is stable. Severity of Tissue Post Debridement is: Fat layer exposed. Post procedure Diagnosis Wound #3: Same as Pre-Procedure Pre-procedure diagnosis of Wound #3 is a Diabetic Wound/Ulcer of the Lower Extremity located on the Left,Lateral Foot . An Soft Cast procedure was performed by [name], RN. Post procedure Diagnosis Wound #3: Same as Pre-Procedure Plan Follow-up Appointments: Return Appointment in 1 week. - with Dr. Dellia Nims Nurse Visit: - Friday 10/21 to change soft cast Other: - Moleculight next week, possible TCC next week Edema Control - Lymphedema / SCD / Other: Elevate legs to the level of the heart or above for 30 minutes daily and/or when sitting, a frequency of: - throughout the day Avoid standing for long periods of time. Exercise regularly Moisturize legs daily. - right leg every night before bed. Compression stocking or Garment 20-30 mm/Hg pressure to: - Apply to right leg in the morning and  remove at night. Off-Loading: Open toe surgical shoe to: - Left Foot with soft cast Other: - minimal weight bearing left foot Additional Orders / Instructions: Follow Nutritious Diet WOUND #3: - Foot Wound Laterality: Left, Lateral Cleanser: Soap and Water 2 x Per Week/30 Days Discharge Instructions: May shower and wash wound with dial antibacterial soap and water prior to dressing change. Cleanser: Wound Cleanser (Generic) 2 x Per Week/30 Days Discharge Instructions: Cleanse the wound with wound cleanser prior to applying a clean dressing using gauze sponges, not tissue or cotton balls. Peri-Wound Care: Zinc Oxide Ointment 30g tube 2 x Per Week/30 Days Discharge Instructions: Apply Zinc Oxide to periwound with each dressing change as needed. Topical: keystone antibiotic compound 2 x Per Week/30  Days Discharge Instructions: thin layer to wound bed Prim Dressing: KerraCel Ag Gelling Fiber Dressing, 4x5 in (silver alginate) (Generic) 2 x Per Week/30 Days ary Discharge Instructions: Apply silver alginate to wound bed as instructed Secondary Dressing: Woven Gauze Sponge, Non-Sterile 4x4 in (Generic) 2 x Per Week/30 Days Discharge Instructions: Apply over primary dressing as directed. Secondary Dressing: Zetuvit Plus 4x8 in (Generic) 2 x Per Week/30 Days Discharge Instructions: Apply over primary dressing as directed. Secondary Dressing: Optifoam Non-Adhesive Dressing, 4x4 in (Generic) 2 x Per Week/30 Days Discharge Instructions: Apply over primary dressing as directed. Secured With: Coban Self-Adherent Wrap 4x5 (in/yd) (Generic) 2 x Per Week/30 Days Discharge Instructions: Secure with Coban as directed. Secured With: [employer], 4.5x3.1 (in/yd) (Generic) 2 x Per Week/30 Days Discharge Instructions: Secure with Kerlix as directed. Secured With: 63M Medipore H Soft Cloth Surgical T 4 x 2 (in/yd) (Generic) 2 x Per Week/30 Days ape Discharge Instructions: Secure dressing with tape as directed. Add-Ons: Soft Cast 2 x Per Week/30 Days 1. I have not changed the primary dressing. He be back on Friday for a nurse visit to change this. He is offloading with a cam boot in the soft cast 2. Not much change in surface area today. Nor is there much change in the amount of drainage. 3. He has been using the [facility] antibiotic for about 2 to 3 weeks. 4. MolecuLight next week to look at the surface bacteria if there are not any I might be persuaded to try a total contact cast which we will have to change twice in 1 week. Before I put a cast on I want to make sure about the biofilm Electronic Signature(s) Signed: 01/19/2021 4:56:56 PM By: [name] MD Entered By: [name] on 01/19/2021  08:33:05 -------------------------------------------------------------------------------- SuperBill Details Patient Name: Date of Service: [name], [name] 01/19/2021 Medical Record Number: [id number] Patient Account Number: [id number] Date of Birth/Sex: Treating RN: [birth year] (35 y.o. [name] Primary Care Provider: [name] Other Clinician: Referring Provider: Treating Provider/Extender: [name] in Treatment: 64 Diagnosis Coding ICD-10 Codes Code Description E11.621 Type 2 diabetes mellitus with foot ulcer L97.528 Non-pressure chronic ulcer of other part of left foot with other specified severity Facility Procedures CPT4 Code: [id number] Description: [phone] - DEBRIDE WOUND 1ST 20 SQ CM OR < ICD-10 Diagnosis Description L97.528 Non-pressure chronic ulcer of other part of left foot with other specified severi Modifier: ty Quantity: 1 Physician Procedures Electronic Signature(s) Signed: 01/19/2021 4:56:56 PM By: [name] MD Entered By: [name] on 01/19/2021 08:33:18

## 2021-01-20 NOTE — Progress Notes (Signed)
[name] ([id number]) Visit Report for 01/19/2021 Arrival Information Details Patient Name: Date of Service: [name] D 01/19/2021 7:30 A M Medical Record Number: [id number] Patient Account Number: [id number] Date of Birth/Sex: Treating RN: [birth year] (35 y.o. [name] Primary Care Haneefah Venturini: [name] Other Clinician: Referring Yandriel Boening: Treating Shuntia Exton/Extender: [name] in Treatment: 14 Visit Information History Since Last Visit Added or deleted any medications: No Patient Arrived: Ambulatory Any new allergies or adverse reactions: No Arrival Time: 07:53 Had a fall or experienced change in No Transfer Assistance: None activities of daily living that may affect Patient Identification Verified: Yes risk of falls: Secondary Verification Process Completed: Yes Signs or symptoms of abuse/neglect since last No Patient Requires Transmission-Based Precautions: No visito Patient Has Alerts: No Hospitalized since last visit: No Implantable device outside of the clinic No excluding cellular tissue based products placed in the center since last visit: Has Dressing in Place as Prescribed: Yes Has Footwear/Offloading in Place as Yes Prescribed: Left: Removable Cast Walker/Walking Boot Pain Present Now: No Electronic Signature(s) Signed: 01/19/2021 4:32:39 PM By: [name] Entered By: [name] on 01/19/2021 07:53:56 -------------------------------------------------------------------------------- Encounter Discharge Information Details Patient Name: Date of Service: [name], [name] 01/19/2021 7:30 A M Medical Record Number: [id number] Patient Account Number: [id number] Date of Birth/Sex: Treating RN: 01/11/1986 (35 y.o. [name] Primary Care Thresea Doble: [name] Other Clinician: Referring [name]: Treating Tarrence Enck/Extender: [name] in Treatment: 64 Encounter Discharge  Information Items Post Procedure Vitals Discharge Condition: Stable Temperature (F): 98.5 Ambulatory Status: Ambulatory Pulse (bpm): 90 Discharge Destination: Home Respiratory Rate (breaths/min): 18 Transportation: Private Auto Blood Pressure (mmHg): 142/89 Schedule Follow-up Appointment: Yes Clinical Summary of Care: Provided on 01/19/2021 Form Type Recipient Paper Patient Patient Electronic Signature(s) Signed: 01/19/2021 9:48:20 AM By: [name] Entered By: [name] on 01/19/2021 09:48:19 -------------------------------------------------------------------------------- Lower Extremity Assessment Details Patient Name: Date of Service: [name], [name] 01/19/2021 7:30 A M Medical Record Number: [id number] Patient Account Number: [id number] Date of Birth/Sex: Treating RN: 1986-01-29 (35 y.o. [name] Primary Care [name]: [name] Other Clinician: Referring Idonia Zollinger: Treating Oneta Sigman/Extender: [name] in Treatment: 64 Edema Assessment Assessed: [name]: Yes] [name]: No] Edema: [Left: Ye] [Right: s] Calf Left: Right: Point of Measurement: 48 cm From Medial Instep 45.5 cm Ankle Left: Right: Point of Measurement: 11 cm From Medial Instep 30 cm Vascular Assessment Pulses: Dorsalis Pedis Palpable: [Left:Yes] Electronic Signature(s) Signed: 01/19/2021 4:32:39 PM By: [name] Entered By: [name] on 01/19/2021 08:08:06 -------------------------------------------------------------------------------- Multi Wound Chart Details Patient Name: Date of Service: [name], [name] 01/19/2021 7:30 A M Medical Record Number: [id number] Patient Account Number: [id number] Date of Birth/Sex: Treating RN: 1986-02-07 (35 y.o. [name] Primary Care Curtisha Bendix: [name] Other Clinician: Referring Anothony Bursch: Treating [name]/Extender: [name] in Treatment: 64 Vital  Signs Height(in): 77 Capillary Blood Glucose(mg/dl): 109 Weight(lbs): 280 Pulse(bpm): 90 Body Mass Index(BMI): 33 Blood Pressure(mmHg): 142/89 Temperature(F): 98.5 Respiratory Rate(breaths/min): 18 Photos: [3:Left, Lateral Foot] [N/A:N/A N/A] Wound Location: [3:Trauma] [N/A:N/A] Wounding Event: [3:Diabetic Wound/Ulcer of the Lower] [N/A:N/A] Primary Etiology: [3:Extremity Type II Diabetes] [N/A:N/A] Comorbid History: [3:10/02/2019] [N/A:N/A] Date Acquired: [3:64] [N/A:N/A] Weeks of Treatment: [3:Open] [N/A:N/A] Wound Status: [3:3.6x3.3x0.4] [N/A:N/A] Measurements L x W x D (cm) [3:9.331] [N/A:N/A] A (cm) : rea [3:3.732] [N/A:N/A] Volume (cm) : [3:-465.90%] [N/A:N/A] % Reduction in A [3:rea: -2161.80%] [N/A:N/A] % Reduction in Volume: [3:5] Starting Position 1 (o'clock): [  3:1] Ending Position 1 (o'clock): [3:0.4] Maximum Distance 1 (cm): [3:Yes] [N/A:N/A] Undermining: [3:Grade 2] [N/A:N/A] Classification: [3:Medium] [N/A:N/A] Exudate A mount: [3:Serosanguineous] [N/A:N/A] Exudate Type: [3:red, brown] [N/A:N/A] Exudate Color: [3:Distinct, outline attached] [N/A:N/A] Wound Margin: [3:Large (67-100%)] [N/A:N/A] Granulation A mount: [3:Red, Pink] [N/A:N/A] Granulation Quality: [3:None Present (0%)] [N/A:N/A] Necrotic A mount: [3:Fat Layer (Subcutaneous Tissue): Yes N/A] Exposed Structures: [3:Fascia: No Tendon: No Muscle: No Joint: No Bone: No None] [N/A:N/A] Epithelialization: [3:Debridement - Selective/Open Wound N/A] Debridement: Pre-procedure Verification/Time Out 08:19 [N/A:N/A] Taken: [3:Other] [N/A:N/A] Pain Control: [3:Callus] [N/A:N/A] Tissue Debrided: [3:Skin/Dermis] [N/A:N/A] Level: [3:11.88] [N/A:N/A] Debridement A (sq cm): [3:rea Curette, Other(Silver Nitrate)] [N/A:N/A] Instrument: [3:Minimum] [N/A:N/A] Bleeding: [3:Pressure] [N/A:N/A] Hemostasis A chieved: [3:Procedure was tolerated well] [N/A:N/A] Debridement Treatment Response: [3:3.6x3.3x0.4]  [N/A:N/A] Post Debridement Measurements L x W x D (cm) [3:3.732] [N/A:N/A] Post Debridement Volume: (cm) [3:Calloused Periwound] [N/A:N/A] Assessment Notes: [3:Debridement] [N/A:N/A] Procedures Performed: [3:Soft Cast] Treatment Notes Electronic Signature(s) Signed: 01/19/2021 4:56:56 PM By: [name] MD Signed: 01/20/2021 5:56:18 PM By: [name] RN, BSN Entered By: [name] on 01/19/2021 08:28:16 -------------------------------------------------------------------------------- Multi-Disciplinary Care Plan Details Patient Name: Date of Service: [name], [name] 01/19/2021 7:30 A M Medical Record Number: [id number] Patient Account Number: [id number] Date of Birth/Sex: Treating RN: [birth year] (35 y.o. [name] Primary Care Keelynn Furgerson: [name] Other Clinician: Referring Meriah Shands: Treating Halston Fairclough/Extender: [name] in Treatment: 64 Multidisciplinary Care Plan reviewed with physician Active Inactive Nutrition Nursing Diagnoses: Imbalanced nutrition Potential for alteratiion in Nutrition/Potential for imbalanced nutrition Goals: Patient/caregiver agrees to and verbalizes understanding of need to use nutritional supplements and/or vitamins as prescribed Date Initiated: 10/24/2019 Date Inactivated: 04/06/2020 Target Resolution Date: 04/03/2020 Goal Status: Met Patient/caregiver will maintain therapeutic glucose control Date Initiated: 10/24/2019 Target Resolution Date: 01/22/2021 Goal Status: Active Interventions: Assess HgA1c results as ordered upon admission and as needed Assess patient nutrition upon admission and as needed per policy Provide education on elevated blood sugars and impact on wound healing Provide education on nutrition Treatment Activities: Education provided on Nutrition : 11/24/2020 Notes: 11/17/20: Glucose control ongoing issue, target date extended. Wound/Skin Impairment Nursing  Diagnoses: Impaired tissue integrity Knowledge deficit related to ulceration/compromised skin integrity Goals: Patient/caregiver will verbalize understanding of skin care regimen Date Initiated: 10/24/2019 Target Resolution Date: 01/22/2021 Goal Status: Active Ulcer/skin breakdown will have a volume reduction of 30% by week 4 Date Initiated: 10/24/2019 Date Inactivated: 01/16/2020 Target Resolution Date: 01/10/2020 Unmet Reason: no change in Goal Status: Unmet measurements. Interventions: Assess patient/caregiver ability to obtain necessary supplies Assess patient/caregiver ability to perform ulcer/skin care regimen upon admission and as needed Assess ulceration(s) every visit Provide education on ulcer and skin care Notes: 11/17/20: Wound care regimen continues Electronic Signature(s) Signed: 01/19/2021 4:32:39 PM By: [name] Entered By: [name] on 01/19/2021 07:53:00 -------------------------------------------------------------------------------- Pain Assessment Details Patient Name: Date of Service: [name] [name] 01/19/2021 7:30 A M Medical Record Number: [id number] Patient Account Number: [id number] Date of Birth/Sex: Treating RN: Jun 16, 1985 (35 y.o. [name] Primary Care Yandriel Boening: [name] Other Clinician: Referring Rayaan Lorah: Treating Joud Pettinato/Extender: [name] in Treatment: 64 Active Problems Location of Pain Severity and Description of Pain Patient Has Paino No Site Locations Pain Management and Medication Current Pain Management: Electronic Signature(s) Signed: 01/19/2021 4:32:39 PM By: [name] Entered By: [name] on 01/19/2021 07:55:53 -------------------------------------------------------------------------------- Patient/Caregiver Education Details Patient Name: Date of Service: [name], [name] 10/18/2022andnbsp7:30 A M Medical Record Number: [id number] Patient Account Number:  [id number] Date of Birth/Gender: Treating RN: 08-30-1985 (35 y.o. [name] Primary Care Physician: [name] Other Clinician: Referring Physician: Treating Physician/Extender: [name] in Treatment: 21 Education Assessment Education Provided To: Patient Education Topics Provided Elevated Blood Sugar/ Impact on Healing: Methods: Explain/Verbal, Printed Responses: State content correctly Offloading: Methods: Explain/Verbal, Printed Responses: State content correctly Wound/Skin Impairment: Methods: Demonstration, Explain/Verbal, Printed Responses: State content correctly Electronic Signature(s) Signed: 01/19/2021 4:32:39 PM By: [name] Entered By: [name] on 01/19/2021 07:53:30 -------------------------------------------------------------------------------- Wound Assessment Details Patient Name: Date of Service: [name], [name] 01/19/2021 7:30 A M Medical Record Number: [id number] Patient Account Number: [id number] Date of Birth/Sex: Treating RN: 13-Jan-1986 (35 y.o. [name] Primary Care Gurtej Noyola: [name] Other Clinician: Referring Plumer Mittelstaedt: Treating Maciah Schweigert/Extender: [name] in Treatment: 64 Wound Status Wound Number: 3 Primary Etiology: Diabetic Wound/Ulcer of the Lower Extremity Wound Location: Left, Lateral Foot Wound Status: Open Wounding Event: Trauma Comorbid History: Type II Diabetes Date Acquired: 10/02/2019 Weeks Of Treatment: 64 Clustered Wound: No Photos Wound Measurements Length: (cm) 3.6 Width: (cm) 3.3 Depth: (cm) 0.4 Area: (cm) 9.331 Volume: (cm) 3.732 % Reduction in Area: -465.9% % Reduction in Volume: -2161.8% Epithelialization: None Undermining: Yes Starting Position (o'clock): 5 Ending Position (o'clock): 1 Maximum Distance: (cm) 0.4 Wound Description Classification: Grade 2 Wound Margin: Distinct, outline attached Exudate Amount:  Medium Exudate Type: Serosanguineous Exudate Color: red, brown Foul Odor After Cleansing: No Slough/Fibrino No Wound Bed Granulation Amount: Large (67-100%) Exposed Structure Granulation Quality: Red, Pink Fascia Exposed: No Necrotic Amount: None Present (0%) Fat Layer (Subcutaneous Tissue) Exposed: Yes Tendon Exposed: No Muscle Exposed: No Joint Exposed: No Bone Exposed: No Assessment Notes Calloused Periwound Treatment Notes Wound #3 (Foot) Wound Laterality: Left, Lateral Cleanser Soap and Water Discharge Instruction: May shower and wash wound with dial antibacterial soap and water prior to dressing change. Wound Cleanser Discharge Instruction: Cleanse the wound with wound cleanser prior to applying a clean dressing using gauze sponges, not tissue or cotton balls. Peri-Wound Care Zinc Oxide Ointment 30g tube Discharge Instruction: Apply Zinc Oxide to periwound with each dressing change as needed. Topical keystone antibiotic compound Discharge Instruction: thin layer to wound bed Primary Dressing KerraCel Ag Gelling Fiber Dressing, 4x5 in (silver alginate) Discharge Instruction: Apply silver alginate to wound bed as instructed Secondary Dressing Woven Gauze Sponge, Non-Sterile 4x4 in Discharge Instruction: Apply over primary dressing as directed. Zetuvit Plus 4x8 in Discharge Instruction: Apply over primary dressing as directed. Optifoam Non-Adhesive Dressing, 4x4 in Discharge Instruction: Apply over primary dressing as directed. Secured With [employer] 4x5 (in/yd) Discharge Instruction: Secure with Coban as directed. Kerlix Roll Sterile, 4.5x3.1 (in/yd) Discharge Instruction: Secure with Kerlix as directed. 23M Medipore H Soft Cloth Surgical T 4 x 2 (in/yd) ape Discharge Instruction: Secure dressing with tape as directed. Compression Wrap Compression Stockings Add-Ons Soft Cast Electronic Signature(s) Signed: 01/19/2021 4:32:39 PM By: [name] Entered By: [name] on 01/19/2021 08:04:14 -------------------------------------------------------------------------------- Vitals Details Patient Name: Date of Service: [name], [name] 01/19/2021 7:30 A M Medical Record Number: [id number] Patient Account Number: [id number] Date of Birth/Sex: Treating RN: 04/13/1985 (35 y.o. [name] Primary Care Janeese Mcgloin: [name] Other Clinician: Referring Linnie Delgrande: Treating Margery Szostak/Extender: [name] in Treatment: 64 Vital Signs Time Taken: 07:56 Temperature (F): 98.5 Height (in): 77 Pulse (bpm): 90 Weight (lbs): 280 Respiratory Rate (breaths/min): 18 Body Mass Index (BMI): 33.2 Blood Pressure (mmHg): 142/89 Capillary Blood  Glucose (mg/dl): 109 Reference Range: 80 - 120 mg / dl Electronic Signature(s) Signed: 01/19/2021 4:32:39 PM By: [name] Entered By: [name] on 01/19/2021 07:57:30

## 2021-01-22 ENCOUNTER — Encounter (HOSPITAL_BASED_OUTPATIENT_CLINIC_OR_DEPARTMENT_OTHER): Payer: BC Managed Care – PPO | Admitting: Internal Medicine

## 2021-01-22 ENCOUNTER — Other Ambulatory Visit: Payer: Self-pay

## 2021-01-25 NOTE — Progress Notes (Signed)
[place], [name] ([id number]) Visit Report for 01/22/2021 Arrival Information Details Patient Name: Date of Service: [name] 01/22/2021 7:30 A M Medical Record Number: [id number] Patient Account Number: [id number] Date of Birth/Sex: Treating RN: [birth year] (35 y.o. [name] Primary Care Shevette Bess: [name] Other Clinician: Referring Valeriano Bain: Treating Blaine Guiffre/Extender: Dione Plover in Treatment: 68 Visit Information History Since Last Visit Added or deleted any medications: No Patient Arrived: Ambulatory Any new allergies or adverse reactions: No Arrival Time: 07:45 Had a fall or experienced change in No Accompanied By: alone activities of daily living that may affect Transfer Assistance: None risk of falls: Patient Identification Verified: Yes Signs or symptoms of abuse/neglect since last visito No Secondary Verification Process Completed: Yes Hospitalized since last visit: No Patient Requires Transmission-Based Precautions: No Implantable device outside of the clinic excluding No Patient Has Alerts: No cellular tissue based products placed in the center since last visit: Has Dressing in Place as Prescribed: Yes Has Footwear/Offloading in Place as Prescribed: Yes Left: Other:soft cast Pain Present Now: No Electronic Signature(s) Signed: 01/25/2021 5:18:13 PM By: Zandra Abts RN, BSN Entered By: Zandra Abts on 01/22/2021 08:26:12 -------------------------------------------------------------------------------- Encounter Discharge Information Details Patient Name: Date of Service: [name], [name] 01/22/2021 7:30 A M Medical Record Number: [id number] Patient Account Number: [id number] Date of Birth/Sex: Treating RN: 06-28-1985 (35 y.o. [name] Primary Care Moet Mikulski: [name] Other Clinician: Referring Tamyia Minich: Treating Bernarr Longsworth/Extender: Dione Plover in Treatment:  64 Encounter Discharge Information Items Discharge Condition: Stable Ambulatory Status: Ambulatory Discharge Destination: Home Transportation: Private Auto Accompanied By: alone Schedule Follow-up Appointment: Yes Clinical Summary of Care: Patient Declined Electronic Signature(s) Signed: 01/25/2021 5:18:13 PM By: Zandra Abts RN, BSN Entered By: Zandra Abts on 01/22/2021 08:28:59 -------------------------------------------------------------------------------- Wound Assessment Details Patient Name: Date of Service: [name], [name] 01/22/2021 7:30 A M Medical Record Number: [id number] Patient Account Number: [id number] Date of Birth/Sex: Treating RN: March 05, 1986 (35 y.o. [name] Primary Care Arryana Tolleson: [name] Other Clinician: Referring Aariana Shankland: Treating Dmarcus Decicco/Extender: Dione Plover in Treatment: 65 Wound Status Wound Number: 3 Primary Etiology: Diabetic Wound/Ulcer of the Lower Extremity Wound Location: Left, Lateral Foot Wound Status: Open Wounding Event: Trauma Comorbid History: Type II Diabetes Date Acquired: 10/02/2019 Weeks Of Treatment: 65 Clustered Wound: No Wound Measurements Length: (cm) 3.6 Width: (cm) 3.3 Depth: (cm) 0.4 Area: (cm) 9.331 Volume: (cm) 3.732 % Reduction in Area: -465.9% % Reduction in Volume: -2161.8% Epithelialization: None Tunneling: No Undermining: No Wound Description Classification: Grade 2 Wound Margin: Distinct, outline attached Exudate Amount: Medium Exudate Type: Serosanguineous Exudate Color: red, brown Foul Odor After Cleansing: No Slough/Fibrino No Wound Bed Granulation Amount: Large (67-100%) Exposed Structure Granulation Quality: Red, Pink Fascia Exposed: No Necrotic Amount: None Present (0%) Fat Layer (Subcutaneous Tissue) Exposed: Yes Tendon Exposed: No Muscle Exposed: No Joint Exposed: No Bone Exposed: No Treatment Notes Wound #3 (Foot) Wound Laterality:  Left, Lateral Cleanser Soap and Water Discharge Instruction: May shower and wash wound with dial antibacterial soap and water prior to dressing change. Wound Cleanser Discharge Instruction: Cleanse the wound with wound cleanser prior to applying a clean dressing using gauze sponges, not tissue or cotton balls. Peri-Wound Care Zinc Oxide Ointment 30g tube Discharge Instruction: Apply Zinc Oxide to periwound with each dressing change as needed. Topical keystone antibiotic compound Discharge Instruction: thin layer to wound bed Primary Dressing KerraCel Ag Gelling Fiber Dressing, 4x5 in (silver alginate) Discharge Instruction: Apply silver  alginate to wound bed as instructed Secondary Dressing Woven Gauze Sponge, Non-Sterile 4x4 in Discharge Instruction: Apply over primary dressing as directed. Zetuvit Plus 4x8 in Discharge Instruction: Apply over primary dressing as directed. Optifoam Non-Adhesive Dressing, 4x4 in Discharge Instruction: Apply over primary dressing as directed. Secured With [employer] 4x5 (in/yd) Discharge Instruction: Secure with Coban as directed. Kerlix Roll Sterile, 4.5x3.1 (in/yd) Discharge Instruction: Secure with Kerlix as directed. 60M Medipore H Soft Cloth Surgical T 4 x 2 (in/yd) ape Discharge Instruction: Secure dressing with tape as directed. Compression Wrap Compression Stockings Add-Ons Catering manager) Signed: 01/25/2021 5:18:13 PM By: Zandra Abts RN, BSN Entered By: Zandra Abts on 01/22/2021 08:27:20 -------------------------------------------------------------------------------- Vitals Details Patient Name: Date of Service: [name], [name] 01/22/2021 7:30 A M Medical Record Number: [id number] Patient Account Number: [id number] Date of Birth/Sex: Treating RN: 01/06/1986 (35 y.o. [name] Primary Care Aviance Cooperwood: [name] Other Clinician: Referring Charistopher Rumble: Treating Xylon Croom/Extender:  Dione Plover in Treatment: 8 Vital Signs Time Taken: 07:45 Temperature (F): 98.4 Height (in): 77 Pulse (bpm): 85 Weight (lbs): 280 Respiratory Rate (breaths/min): 16 Body Mass Index (BMI): 33.2 Blood Pressure (mmHg): 145/94 Reference Range: 80 - 120 mg / dl Electronic Signature(s) Signed: 01/25/2021 5:18:13 PM By: Zandra Abts RN, BSN Entered By: Zandra Abts on 01/22/2021 20:25:42

## 2021-01-25 NOTE — Progress Notes (Signed)
[place], [name] ([id number]) Visit Report for 01/22/2021 Soft Cast Details Patient Name: Date of Service: [name] 01/22/2021 7:30 A M Medical Record Number: [id number] Patient Account Number: [id number] Date of Birth/Sex: Treating RN: 01-03-1986 (35 y.o. [name] Primary Care Provider: [name] Other Clinician: Referring Provider: Treating Provider/Extender: Dione Plover in Treatment: 27 Procedure Performed for: Wound #3 Left,Lateral Foot Performed By: Clinician Zandra Abts, RN Electronic Signature(s) Signed: 01/22/2021 12:53:04 PM By: Geralyn Corwin DO Signed: 01/25/2021 5:18:13 PM By: Zandra Abts RN, BSN Entered By: Zandra Abts on 01/22/2021 08:27:54 -------------------------------------------------------------------------------- SuperBill Details Patient Name: Date of Service: [name], [name] 01/22/2021 Medical Record Number: [id number] Patient Account Number: [id number] Date of Birth/Sex: Treating RN: 12/13/1985 (35 y.o. 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

## 2021-01-26 ENCOUNTER — Encounter (HOSPITAL_BASED_OUTPATIENT_CLINIC_OR_DEPARTMENT_OTHER): Payer: BC Managed Care – PPO | Admitting: Internal Medicine

## 2021-01-26 ENCOUNTER — Other Ambulatory Visit: Payer: Self-pay

## 2021-01-26 DIAGNOSIS — E11621 Type 2 diabetes mellitus with foot ulcer: Secondary | ICD-10-CM | POA: Diagnosis not present

## 2021-01-29 ENCOUNTER — Other Ambulatory Visit: Payer: Self-pay

## 2021-01-29 ENCOUNTER — Encounter (HOSPITAL_BASED_OUTPATIENT_CLINIC_OR_DEPARTMENT_OTHER): Payer: BC Managed Care – PPO | Admitting: Internal Medicine

## 2021-01-29 DIAGNOSIS — E11621 Type 2 diabetes mellitus with foot ulcer: Secondary | ICD-10-CM | POA: Diagnosis not present

## 2021-01-29 NOTE — Progress Notes (Signed)
[place], [name] ([id number]) Visit Report for 01/29/2021 SuperBill Details Patient Name: Date of Service: Geoffry Paradise 01/29/2021 Medical Record Number: [id number] Patient Account Number: [id number] Date of Birth/Sex: Treating RN: 1985/09/12 (35 y.o. 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

## 2021-01-29 NOTE — Progress Notes (Signed)
[place], [name] ([id number]) Visit Report for 01/29/2021 Arrival Information Details Patient Name: Date of Service: [name] 01/29/2021 8:45 A M Medical Record Number: [id number] Patient Account Number: [id number] Date of Birth/Sex: Treating RN: [birth year] (35 y.o. [name] Primary Care Shadonna Benedick: [name] Other Clinician: Referring Zamyah Wiesman: Treating Khrystal Jeanmarie/Extender: Dione Plover in Treatment: 64 Visit Information History Since Last Visit Added or deleted any medications: No Patient Arrived: Ambulatory Any new allergies or adverse reactions: No Arrival Time: 09:01 Had a fall or experienced change in No Transfer Assistance: None activities of daily living that may affect Patient Identification Verified: Yes risk of falls: Secondary Verification Process Completed: Yes Signs or symptoms of abuse/neglect since last visito No Patient Requires Transmission-Based Precautions: No Hospitalized since last visit: No Patient Has Alerts: No Implantable device outside of the clinic excluding No cellular tissue based products placed in the center since last visit: Has Dressing in Place as Prescribed: Yes Has Footwear/Offloading in Place as Prescribed: Yes Left: Other:Soft Cast Pain Present Now: No Electronic Signature(s) Signed: 01/29/2021 12:11:31 PM By: Antonieta Iba Entered By: Antonieta Iba on 01/29/2021 09:01:51 -------------------------------------------------------------------------------- Clinic Level of Care Assessment Details Patient Name: Date of Service: [name] 01/29/2021 8:45 A M Medical Record Number: [id number] Patient Account Number: [id number] Date of Birth/Sex: Treating RN: 09-15-1985 (35 y.o. [name] Primary Care Imaan Padgett: [name] Other Clinician: Referring Dalynn Jhaveri: Treating Ayden Hardwick/Extender: Dione Plover in Treatment: 66 Clinic Level of Care Assessment  Items TOOL 4 Quantity Score X- 1 0 Use when only an EandM is performed on FOLLOW-UP visit ASSESSMENTS - Nursing Assessment / Reassessment X- 1 10 Reassessment of Co-morbidities (includes updates in patient status) X- 1 5 Reassessment of Adherence to Treatment Plan ASSESSMENTS - Wound and Skin A ssessment / Reassessment X - Simple Wound Assessment / Reassessment - one wound 1 5 []  - 0 Complex Wound Assessment / Reassessment - multiple wounds []  - 0 Dermatologic / Skin Assessment (not related to wound area) ASSESSMENTS - Focused Assessment []  - 0 Circumferential Edema Measurements - multi extremities []  - 0 Nutritional Assessment / Counseling / Intervention []  - 0 Lower Extremity Assessment (monofilament, tuning fork, pulses) []  - 0 Peripheral Arterial Disease Assessment (using hand held doppler) ASSESSMENTS - Ostomy and/or Continence Assessment and Care []  - 0 Incontinence Assessment and Management []  - 0 Ostomy Care Assessment and Management (repouching, etc.) PROCESS - Coordination of Care X - Simple Patient / Family Education for ongoing care 1 15 []  - 0 Complex (extensive) Patient / Family Education for ongoing care []  - 0 Staff obtains , Records, T Results / Process Orders est []  - 0 Staff telephones HHA, Nursing Homes / Clarify orders / etc []  - 0 Routine Transfer to another Facility (non-emergent condition) []  - 0 Routine Hospital Admission (non-emergent condition) []  - 0 New Admissions / / Ordering NPWT Apligraf, etc. , []  - 0 Emergency Hospital Admission (emergent condition) []  - 0 Simple Discharge Coordination []  - 0 Complex (extensive) Discharge Coordination PROCESS - Special Needs []  - 0 Pediatric / Minor Patient Management []  - 0 Isolation Patient Management []  - 0 Hearing / Language / Visual special needs []  - 0 Assessment of Community assistance (transportation, [name]/C planning, etc.) []  - 0 Additional assistance  / Altered mentation []  - 0 Support Surface(s) Assessment (bed, cushion, seat, etc.) INTERVENTIONS - Wound Cleansing / Measurement X - Simple Wound Cleansing - one wound 1 5 []  -  0 Complex Wound Cleansing - multiple wounds []  - 0 Wound Imaging (photographs - any number of wounds) []  - 0 Wound Tracing (instead of photographs) X- 1 5 Simple Wound Measurement - one wound []  - 0 Complex Wound Measurement - multiple wounds INTERVENTIONS - Wound Dressings []  - 0 Small Wound Dressing one or multiple wounds []  - 0 Medium Wound Dressing one or multiple wounds X- 1 20 Large Wound Dressing one or multiple wounds []  - 0 Application of Medications - topical []  - 0 Application of Medications - injection INTERVENTIONS - Miscellaneous []  - 0 External ear exam []  - 0 Specimen Collection (cultures, biopsies, blood, body fluids, etc.) []  - 0 Specimen(s) / Culture(s) sent or taken to Lab for analysis []  - 0 Patient Transfer (multiple staff / / Similar devices) []  - 0 Simple Staple / Suture removal (25 or less) []  - 0 Complex Staple / Suture removal (26 or more) []  - 0 Hypo / Hyperglycemic Management (close monitor of Blood Glucose) []  - 0 Ankle / Brachial Index (ABI) - do not check if billed separately X- 1 5 Vital Signs Has the patient been seen at the hospital within the last three years: Yes Total Score: 70 Level Of Care: New/Established - Level 2 Electronic Signature(s) Signed: 01/29/2021 12:11:31 PM By: Entered By: on 01/29/2021 10:02:18 -------------------------------------------------------------------------------- Encounter Discharge Information Details Patient Name: Date of Service: , [name] 01/29/2021 8:45 A M Medical Record Number: Patient Account Number: Date of Birth/Sex: Treating RN: [birth year] (35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, adult Primary Care Rondo Spittler: Other Clinician: Referring  Shahla Betsill: Treating Adanya Sosinski/Extender: in Treatment: 40 Encounter Discharge Information Items Discharge Condition: Stable Ambulatory Status: Ambulatory Discharge Destination: Home Transportation: Private Auto Schedule Follow-up Appointment: Yes Clinical Summary of Care: Provided on 01/29/2021 Form Type Recipient Paper Patient Patient Electronic Signature(s) Signed: 01/29/2021 10:01:49 AM By: Antonieta Iba Entered By: Antonieta Iba on 01/29/2021 10:01:49 -------------------------------------------------------------------------------- Patient/Caregiver Education Details Patient Name: Date of Service: [name], [name] 10/28/2022andnbsp8:45 A M Medical Record Number: [id number] Patient Account Number: [id number] Date of Birth/Gender: Treating RN: [birth year] (35 y.o. [name] Primary Care Physician: [name] Other Clinician: Referring Physician: Treating Physician/Extender: Dione Plover in Treatment: 95 Education Assessment Education Provided To: Patient Education Topics Provided Offloading: Methods: Explain/Verbal Responses: State content correctly Wound/Skin Impairment: Methods: Explain/Verbal, Printed Responses: State content correctly Electronic Signature(s) Signed: 01/29/2021 12:11:31 PM By: 01/31/2021 Entered By: Antonieta Iba on 01/29/2021 10:01:31 -------------------------------------------------------------------------------- Wound Assessment Details Patient Name: Date of Service: 01/31/2021, [name] 01/29/2021 8:45 A M Medical Record Number: 01/31/2021 Patient Account Number: [id number] Date of Birth/Sex: Treating RN: 07-Aug-1985 (35 y.o. 20 Primary Care Janari Yamada: [name] Other Clinician: Referring Garrette Caine: Treating Masako Overall/Extender: [name] in Treatment: 66 Wound Status Wound Number: 3 Primary Etiology: Diabetic  Wound/Ulcer of the Lower Extremity Wound Location: Left, Lateral Foot Wound Status: Open Wounding Event: Trauma Comorbid History: Type II Diabetes Date Acquired: 10/02/2019 Weeks Of Treatment: 66 Clustered Wound: No Wound Measurements Length: (cm) 3.6 Width: (cm) 3 Depth: (cm) 0.4 Area: (cm) 8.482 Volume: (cm) 3.393 % Reduction in Area: -414.4% % Reduction in Volume: -1956.4% Epithelialization: None Tunneling: No Undermining: No Wound Description Classification: Grade 2 Wound Margin: Distinct, outline attached Exudate Amount: Medium Exudate Type: Serosanguineous Exudate Color: red, brown Foul Odor After Cleansing: No Slough/Fibrino No Wound Bed Granulation Amount: Large (67-100%) Exposed  Structure Granulation Quality: Red, Pink Fascia Exposed: No Necrotic Amount: None Present (0%) Fat Layer (Subcutaneous Tissue) Exposed: Yes Tendon Exposed: No Muscle Exposed: No Joint Exposed: No Bone Exposed: No Assessment Notes slight maceration Treatment Notes Wound #3 (Foot) Wound Laterality: Left, Lateral Cleanser Soap and Water Discharge Instruction: May shower and wash wound with dial antibacterial soap and water prior to dressing change. Wound Cleanser Discharge Instruction: Cleanse the wound with wound cleanser prior to applying a clean dressing using gauze sponges, not tissue or cotton balls. Peri-Wound Care Zinc Oxide Ointment 30g tube Discharge Instruction: Apply Zinc Oxide to periwound with each dressing change as needed. Topical keystone antibiotic compound Discharge Instruction: thin layer to wound bed Primary Dressing KerraCel Ag Gelling Fiber Dressing, 4x5 in (silver alginate) Discharge Instruction: Apply silver alginate to wound bed as instructed Secondary Dressing Woven Gauze Sponge, Non-Sterile 4x4 in Discharge Instruction: Apply over primary dressing as directed. Zetuvit Plus 4x8 in Discharge Instruction: Apply over primary dressing as  directed. Optifoam Non-Adhesive Dressing, 4x4 in Discharge Instruction: Apply over primary dressing as directed. Secured With Compression Wrap Kerlix Roll 4.5x3.1 (in/yd) Discharge Instruction: Apply Kerlix and Coban compression as directed. Coban Self-Adherent Wrap 4x5 (in/yd) Discharge Instruction: Apply over Kerlix as directed. Compression Stockings Add-Ons Soft Cast Electronic Signature(s) Signed: 01/29/2021 10:00:51 AM By: Antonieta Iba Entered By: Antonieta Iba on 01/29/2021 10:00:50 -------------------------------------------------------------------------------- Vitals Details Patient Name: Date of Service: [name], [name] 01/29/2021 8:45 A M Medical Record Number: [id number] Patient Account Number: [id number] Date of Birth/Sex: Treating RN: 1985/07/27 (35 y.o. [name] Primary Care Chaeli Judy: [name] Other Clinician: Referring Casimer Russett: Treating Costella Schwarz/Extender: Dione Plover in Treatment: 36 Vital Signs Time Taken: 09:10 Temperature (F): 98.5 Height (in): 77 Pulse (bpm): 81 Weight (lbs): 280 Respiratory Rate (breaths/min): 16 Body Mass Index (BMI): 33.2 Blood Pressure (mmHg): 175/86 Capillary Blood Glucose (mg/dl): 570 Reference Range: 80 - 120 mg / dl Electronic Signature(s) Signed: 01/29/2021 12:11:31 PM By: Antonieta Iba Entered By: Antonieta Iba on 01/29/2021 09:11:03

## 2021-02-03 ENCOUNTER — Encounter (HOSPITAL_BASED_OUTPATIENT_CLINIC_OR_DEPARTMENT_OTHER): Payer: BC Managed Care – PPO | Attending: Internal Medicine | Admitting: Internal Medicine

## 2021-02-03 ENCOUNTER — Other Ambulatory Visit: Payer: Self-pay

## 2021-02-03 DIAGNOSIS — L97528 Non-pressure chronic ulcer of other part of left foot with other specified severity: Secondary | ICD-10-CM | POA: Diagnosis present

## 2021-02-03 DIAGNOSIS — Z1629 Resistance to other single specified antibiotic: Secondary | ICD-10-CM | POA: Diagnosis not present

## 2021-02-03 DIAGNOSIS — E11621 Type 2 diabetes mellitus with foot ulcer: Secondary | ICD-10-CM | POA: Insufficient documentation

## 2021-02-03 DIAGNOSIS — Z8616 Personal history of COVID-19: Secondary | ICD-10-CM | POA: Diagnosis not present

## 2021-02-03 DIAGNOSIS — M659 Synovitis and tenosynovitis, unspecified: Secondary | ICD-10-CM | POA: Diagnosis not present

## 2021-02-04 NOTE — Progress Notes (Signed)
[name], Max ([id number]) Visit Report for 02/03/2021 Debridement Details Patient Name: Date of Service: [name] Medical Record Number: [id number] Patient Account Number: [id number] Date of Birth/Sex: Treating RN: May 31, 1985 (35 y.o. [name] Primary Care Provider: Seward [name] Other Clinician: Referring Provider: Treating Provider/Extender: [name] in Treatment: 66 Debridement Performed for Assessment: Wound #3 Left,Lateral Foot Performed By: Physician [name]., MD Debridement Type: Debridement Severity of Tissue Pre Debridement: Fat layer exposed Level of Consciousness (Pre-procedure): Awake and Alert Pre-procedure Verification/Time Out Yes - 08:12 Taken: Start Time: 08:12 T Area Debrided (L x W): otal 3.6 (cm) x 2.7 (cm) = 9.72 (cm) Tissue and other material debrided: Non-Viable, Slough, Subcutaneous, Biofilm, Slough Level: Skin/Subcutaneous Tissue Debridement Description: Excisional Instrument: Curette Bleeding: Moderate Hemostasis Achieved: Pressure End Time: 08:13 Procedural Pain: 0 Post Procedural Pain: 0 Response to Treatment: Procedure was tolerated well Level of Consciousness (Post- Awake and Alert procedure): Post Debridement Measurements of Total Wound Length: (cm) 3.6 Width: (cm) 2.7 Depth: (cm) 0.5 Volume: (cm) 3.817 Character of Wound/Ulcer Post Debridement: Improved Severity of Tissue Post Debridement: Fat layer exposed Post Procedure Diagnosis Same as Pre-procedure Electronic Signature(s) Signed: 02/04/2021 5:02:06 PM By: Max Ham MD Signed: 02/04/2021 5:51:35 PM By: Max Hurst RN, BSN Entered By: [name] on 02/03/2021 08:32:29 -------------------------------------------------------------------------------- HPI Details Patient Name: Date of Service: [name], [name] 02/03/2021 7:30 [name] Medical Record Number: [id number] Patient Account Number:  [id number] Date of Birth/Sex: Treating RN: 09-24-1985 (35 y.o. 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 He comes in for review of Max wound over the plantar fifth metatarsal head extending into the lateral part of the foot. He was followed for this previously by his podiatrist Dr. Cornelius [name]. As the patient tells his story he went to see podiatry first for Max swelling he developed on the lateral part of his fifth metatarsal head in May. He states this was "open" by podiatry and the area closed. He was followed up in June and it was again opened callus removed and it closed promptly. There were plans being made for surgery on the fifth metatarsal head in June however his blood sugar was apparently too high for anesthesia. Apparently the area was debrided and opened again in June and it is never closed since. Looking over the records from podiatry I am really not able to follow this. It was clear when he was first seen it was before 5/14 at that point he already had Max wound. By 5/17 the ulcer was resolved. I do not see anything about Max procedure. On 5/28 noted to have pre-ulcerative moderate keratosis. X-ray noted 1/5 contracted toe and tailor's bunion and metatarsal deformity. On Max visit date on 09/28/2018 the dorsal part of the left foot it healed and resolved. There was concern about swelling in his lower extremity he was sent to the ER.. As far as I can tell he was seen in the ER on 7/12 with an ulcer on his left foot. Max DVT rule out of the left leg was negative. I do not think I have complete records from podiatry but I am not able to verify the procedures this patient states he had. He states after the last procedure the  wound has never closed although I am not able to follow this in the records I have from podiatry. He has not had Max recent x-ray The patient has been using Neosporin on the wound. He is wearing Max Darco shoe. He is still very active up on his foot working and exercising. Past medical history; type 2 diabetes ketosis-prone, leg swelling with Max negative DVT study in July. Non-smoker ABI in our clinic was 0.85 on the left 10/16; substantial wound on the plantar left fifth met head extending laterally almost to the dorsal fifth MTP. We have been using silver alginate we gave him Max Darco forefoot off loader. An x-ray did not show evidence of osteomyelitis did note soft tissue emphysema which I think was due to gas tracking through an open wound. There is no doubt in my mind he requires an MRI 10/23; MRI not booked until 3 November at the earliest this is largely due to his glucose sensor in the right arm. We have been using silver alginate. There has been an improvement 10/29; I am still not exactly sure when his MRI is booked for. He says it is the third but it is the 10th in epic. This definitely needs to be done. He is running Max low-grade fever today but no other symptoms. No real improvement in the 1 02/26/2019 patient presents today for Max follow-up visit here in our clinic he is last been seen in the clinic on October 29. Subsequently we were working on getting MRI to evaluate and see what exactly was going on and where we would need to go from the standpoint of whether or not he had osteomyelitis and again what treatments were going be required. Subsequently the patient ended up being admitted to the hospital on 02/07/2019 and was discharged on 02/14/2019. This is Max somewhat interesting admission with Max discharge diagnosis of pneumonia due to COVID-19 although he was positive for COVID-19 when tested at the urgent care but negative x2 when he was actually in the hospital. With that being said he did  have acute respiratory failure with hypoxia and it was noted he also have Max left foot ulceration with osteomyelitis. With that being said he did require oxygen for his pneumonia and I level 4 L. He was placed on antivirals and steroids for the COVID-19. He was also transferred to the [place] at one point. Nonetheless he did subsequently discharged home and since being home has done much better in that regard. The CT angiogram did not show any pulmonary embolism. With regard to the osteomyelitis the patient was placed on vancomycin and Zosyn while in the hospital but has been changed to Augmentin at discharge. It was also recommended that he follow- up with wound care and podiatry. Podiatry however wanted him to see [name] according to the patient prior to them doing anything further. His hemoglobin A1c was 9.9 as noted in the hospital. Have an MRI of the left foot performed while in the hospital on 02/04/2019. 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 There is an overlying plantar open wound noted an abscess tracking back along the lateral aspect of the fifth metatarsal shaft. There is otherwise diffuse cellulitis and mild fasciitis without findings of polymyositis. The patient did have recently pneumonia secondary to COVID-19 I looked in the chart through epic and it does appear that the patient may need to have an additional x-ray  just to ensure everything is cleared and that he has no airspace disease prior to putting him into the [name]. 03/05/2019; patient was readmitted to the clinic last week. He was hospitalized twice for Max viral upper respiratory tract infection from 11/1 through 11/4 and then 11/5 through 11/12 ultimately this turned out to be Covid pneumonitis. Although he was discharged on oxygen he is not using it. He says he feels fine. He has no exercise limitation no cough no sputum.  His O2 sat in our clinic today was 100% on room air. 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 He received Vanco and Zosyn in the hospital and then was discharged on 2 weeks of Augmentin. I do not see any relevant cultures. He was supposed to follow-up with infectious disease but I do not see that he has an appointment. 12/8; patient saw Dr. Novella Olive of infectious disease last week. He felt that he had had adequate antibiotic therapy. He did not go to follow-up with Dr. Amalia Hailey of podiatry and I have again talked to him about the pros and cons of this. He does not want to consider Max ray amputation of this time. He is aware of the risks of recurrence, migration etc. He started HBO today and tolerated this well. He can complete the Augmentin that I gave him last week. I have looked over the lab work that Dr. Chana Bode ordered his C-reactive protein was 3.3 and his sedimentation rate was 17. The C-reactive protein is never really been measurably that high in this patient 12/15; not much change in the wound today however he has undermining along the lateral part of the foot again more extensively than last week. He has some rims of epithelialization. We have been using silver alginate. He is undergoing hyperbarics but did not dive today 12/18; in for his obligatory first total contact cast change. Unfortunately there was pus coming from the undermining area around his fifth metatarsal head. This was cultured but will preclude reapplication of Max cast. He is seen in conjunction with HBO 12/24; patient had staph lugdunensis in the wound in the undermining area laterally last time. We put him on doxycycline which should have covered this. The wound looks better today. I am going to give him another week of doxycycline before reattempting the total contact cast 12/31; the patient is completing  antibiotics. Hemorrhagic debris in the distal part of the wound with some undermining distally. He also had hyper granulation. Extensive debridement with Max #5 curette. The infected area that was on the lateral part of the fifth met head is closed over. I do not think he needs any more antibiotics. Patient was seen prior to HBO. Preparations for Max total contact cast were made in the cast will be placed post hyperbarics 04/11/19; once again the patient arrives today without complaint. He had been in Max cast all week noted that he had heavy drainage this week. This resulted in large raised areas of macerated tissue around the wound 1/14; wound bed looks better slightly smaller. Hydrofera Blue has been changing himself. He had Max heavy drainage last week which caused Max lot of maceration around the wound so I took him out of Max total contact cast he says the drainage is actually better this week He is seen today in conjunction with HBO 1/21; returns to clinic. He was up in [place] for Max day  or 2 attending Max funeral. He comes back in with the wound larger and with Max large area of exposed bone. He had osteomyelitis and septic arthritis of the fifth left metatarsal head while he was in hospital. He received IV antibiotics in the hospital for Max prolonged period of time then 3 weeks of Augmentin. Subsequently I gave him 2 weeks of doxycycline for more superficial wound infection. When I saw this last week the wound was smaller the surface of the wound looks satisfactory. 1/28; patient missed hyperbarics today. Bone biopsy I did last time showed Enterococcus faecalis and Staphylococcus lugdunensis . He has Max wide area of exposed bone. We are going to use silver alginate as of today. I had another ethical discussion with the patient. 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 In this situation I think the likelihood of healing this is low. Therefore I have recommended Max ray  amputation and with the patient's agreement I have referred him to Dr. Doran Durand. The other issue is that his compliance with hyperbarics has been minimal because of his work schedule and given his underlying decision I am going to stop this today READMISSION 10/24/2019 MRI 09/29/2019 left foot IMPRESSION: 1. Apparent skin ulceration inferior and lateral to the 5th metatarsal base with underlying heterogeneous T2 signal and enhancement in the subcutaneous fat. Small peripherally enhancing fluid collections along the plantar and lateral aspects of the 5th metatarsal base suspicious for abscesses. 2. Interval amputation through the mid 5th metatarsal with nonspecific low-level marrow edema and enhancement. Given the proximity to the adjacent soft tissue inflammatory changes, osteomyelitis cannot be excluded. 3. The additional bones appear unremarkable. MRI 09/29/2019 right foot IMPRESSION: 1. Soft tissue ulceration lateral to the 5th MTP joint. There is low-level T2 hyperintensity within the 4th and 5th metatarsal heads and adjacent proximal phalanges without abnormal T1 signal or cortical destruction. These findings are nonspecific and could be seen with early marrow edema, hyperemia or early osteomyelitis. No evidence of septic joint. 2. Mild tenosynovitis and synovial enhancement associated with the extensor digitorum tendons at the level of the midfoot. 3. Diffuse low-level muscular T2 hyperintensity and enhancement, most consistent with diabetic myopathy. LEFT FOOT BONE Methicillin resistant staphylococcus aureus Staphylococcus lugdunensis MIC MIC CIPROFLOXACIN >=8 RESISTANT Resistant <=0.5 SENSI... Sensitive CLINDAMYCIN <=0.25 SENS... Sensitive >=8 RESISTANT Resistant ERYTHROMYCIN >=8 RESISTANT Resistant >=8 RESISTANT Resistant GENTAMICIN <=0.5 SENSI... Sensitive <=0.5 SENSI... Sensitive Inducible Clindamycin NEGATIVE Sensitive NEGATIVE Sensitive OXACILLIN >=4 RESISTANT Resistant 2  SENSITIVE Sensitive RIFAMPIN <=0.5 SENSI... Sensitive <=0.5 SENSI... Sensitive TETRACYCLINE <=1 SENSITIVE Sensitive <=1 SENSITIVE Sensitive TRIMETH/SULFA <=10 SENSIT Sensitive <=10 SENSIT Sensitive ... Marland Kitchen.. VANCOMYCIN 1 SENSITIVE Sensitive <=0.5 SENSI... Sensitive Right foot bone . Component 3 wk ago Specimen Description BONE Special Requests RIGHT 4 METATARSAL SAMPLE B Gram Stain NO WBC SEEN NO ORGANISMS SEEN Culture RARE METHICILLIN RESISTANT STAPHYLOCOCCUS AUREUS NO ANAEROBES ISOLATED Performed at [facility], [place] [address], [place], [place] [address] Report Status 10/08/2019 FINAL Organism ID, Bacteria METHICILLIN RESISTANT STAPHYLOCOCCUS AUREUS Resulting Agency [facility] Susceptibility Methicillin resistant staphylococcus aureus MIC CIPROFLOXACIN >=8 RESISTANT Resistant CLINDAMYCIN <=0.25 SENS... Sensitive ERYTHROMYCIN >=8 RESISTANT Resistant GENTAMICIN <=0.5 SENSI... Sensitive Inducible Clindamycin NEGATIVE Sensitive OXACILLIN >=4 RESISTANT Resistant RIFAMPIN <=0.5 SENSI... Sensitive TETRACYCLINE <=1 SENSITIVE Sensitive TRIMETH/SULFA <=10 SENSIT Sensitive ... VANCOMYCIN 1 SENSITIVE Sensitive This is Max patient we had in clinic earlier this year with Max wound over his left fifth metatarsal head. 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 Doran Durand on 05/16/2019. He seems to have developed problems with wounds on his bilateral feet in June 2021 although he may have had problems earlier than this. He was in an urgent care with Max right foot ulcer on 09/26/2019 and given Max course of doxycycline. This was apparently after having trouble getting into see orthopedics. He was seen by podiatry  on 09/28/2019 noted to have bilateral lower extremity ulcers including the left lateral fifth metatarsal base and the right subfifth met head. It was noted that had purulent drainage at that time. He required hospitalization from 6/20 through 7/2. This was because of worsening right foot wounds. He underwent bilateral operative incision and drainage and bone biopsies bilaterally. Culture results are listed above. He has been referred back to clinic by Dr. Jacqualyn Posey of podiatry. He is also followed by Dr. Megan Salon who saw him yesterday. He was discharged from hospital on Zyvox Flagyl and Levaquin and yesterday changed to doxycycline Flagyl and Levaquin. His inflammatory markers on 6/26 showed Max sedimentation rate of 129 and Max C-reactive protein of 5. This is improved to 14 and 1.3 respectively. This would indicate improvement. ABIs in our clinic today were 1.23 on the right and 1.20 on the left 11/01/2019 on evaluation today patient appears to be doing fairly well in regard to the wounds on his feet at this point. Fortunately there is no signs of active infection at this time. No fevers, chills, nausea, vomiting, or diarrhea. He currently is seeing infectious disease and still under their care at this point. Subsequently he also has both wounds which she has not been using collagen on as he did not receive that in his packaging he did not call us and let us know that. Apparently that just was missed on the order. Nonetheless we will get that straightened out today. 8/9-Patient returns for bilateral foot wounds, using Prisma with hydrogel moistened dressings, and the wounds appear stable. Patient using surgical shoes, avoiding much pressure or weightbearing as much as possible 8/16; patient has bilateral foot wounds. 1 on the right lateral foot proximally the other is on the left mid lateral foot. Both required debridement of callus and thick skin around the wounds. We have been using silver collagen 8/27;  patient has bilateral lateral foot wounds. The area on the left substantially surrounded by callus and dry skin. This was removed from the wound edge. The underlying wound is small. The area on the right measured somewhat smaller today. 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 Unfortunately I did not update his antibiotics during today's visit. 9/10 I reviewed Dr. Hale Bogus last notes he felt he had completed antibiotics his inflammatory markers were reasonably well controlled. He has Max small wound on the lateral left foot and Max tiny area on the right which is just above closed. He is using Hydrofera Blue with border foam he has bilateral surgical shoes 9/24; 2 week f/u. doing well. right foot is closed. left foot still undermined. 10/14; right foot remains closed at the fifth met head. The area over the base of the left fifth metatarsal has Max small open area but considerable undermining towards the plantar foot. Thick callus skin around this suggests an adequate  pressure relief. We have talked about this. He says he is going to go back into his cam boot. I suggested Max total contact cast he did not seem enamored with this suggestion 10/26; left foot base of the fifth metatarsal. Same condition as last time. He has skin over the area with an open wound however the skin is not adherent. He went to see Dr. Earleen Newport who did an x-ray and culture of his foot I have not reviewed the x-ray but the patient was not told anything. He is on doxycycline 11/11; since the patient was last here he was in the emergency room on 10/30 he was concerned about swelling in the left foot. They did not do any cultures or x-rays. They changed his antibiotics to cephalexin. Previous culture showed group B strep. The cephalexin is appropriate as doxycycline has less than predictable coverage. Arrives in clinic today with swelling over this area under the wound.  He also has Max new wound on the right fifth metatarsal head 11/18; the patient has Max difficult wound on the lateral aspect of the left fifth metatarsal head. The wound was almost ballotable last week I opened it slightly expecting to see purulence however there was just bleeding. I cultured this this was negative. X-ray unchanged. We are trying to get an MRI but I am not sure were going to be able to get this through his insurance. He also has an area on the right lateral fifth metatarsal head this looks healthier 12/3; the patient finally got our MRI. Surprisingly this did not show osteomyelitis. I did show the soft tissue ulceration at the lateral plantar aspect of the fifth metatarsal base with Max tiny residual 6 mm abscess overlying the superficial fascia I have tried to culture this area I have not been able to get this to grow anything. Nevertheless the protruding tissue looks aggravated. I suspect we should try to treat the underlying "abscess with broad-spectrum antibiotics. I am going to start him on Levaquin and Flagyl. He has much less edema in his legs and I am going to continue to wrap his legs and see him weekly 12/10. I started Levaquin and Flagyl on him last week. He just picked up the Flagyl apparently there was some delay. The worry is the wound on the left fifth metatarsal base which is substantial and worsening. His foot looks like he inverts at the ankle making this Max weightbearing surface. Certainly no improvement in fact I think the measurements of this are somewhat worse. We have been using 12/17; he apparently just got the Levaquin yesterday this is 2 weeks after the fact. He has completed the Flagyl. The area over the left fifth metatarsal base still has protruding granulation tissue although it does not look quite as bad as it did some weeks ago. He has severe bilateral lymphedema although we have not been treating him for wounds on his legs this is definitely going to require  compression. There was so much edema in the left I did not wish to put him in Max total contact cast today. I am going to increase his compression from 3-4 layer. The area on the right lateral fifth met head actually look quite good and superficial. 12/23; patient arrived with callus on the right fifth met head and the substantial hyper granulated callused wound on the base of his fifth metatarsal. He says he is completing his Levaquin in 2 days but I do not think that adds up with what I  gave him but I will have to double check this. We are using Hydrofera Blue on both areas. My plan is to put the left leg in Max cast the week after New Year's 04/06/2020; patient's wounds about the same. Right lateral fifth metatarsal head and left lateral foot over the base of the fifth metatarsal. There is undermining on the left lateral foot which I removed before application of total contact cast continuing with Hydrofera Blue new. Patient tells me he was seen by endocrinology today lab work was done [Dr. Kerr]. Also wondering whether he was referred to cardiology. I went over some lab work from previously does not have chronic renal failure certainly not nephrotic range proteinuria he does have very poorly controlled diabetes but this is not his most updated lab work. Hemoglobin A1c has been over 11 1/10; the patient had Max considerable amount of leakage towards mid part of his left foot with macerated skin however the wound surface looks better the area on the right lateral fifth met head is better as well. I am going to change the dressing on the left foot under the total contact cast to silver alginate, continue with Hydrofera Blue on the right. 1/20; patient was in the total contact cast for 10 days. Considerable amount of drainage although the skin around the wound does not look too bad on the left foot. The area on the right fifth metatarsal head is closed. Our nursing staff reports large amount of drainage out  of the left lateral foot wound 1/25; continues with copious amounts of drainage described by our intake staff. PCR culture I did last week showed E. coli and Enterococcus faecalis and low quantities. Multiple resistance genes documented including extended spectrum beta lactamase, MRSA, MRSE, quinolone, tetracycline. The wound is not quite as good this week as it was 5 days ago but about the same size 2/3; continues with copious amounts of malodorous drainage per our intake nurse. The PCR culture I did 2 weeks ago showed E. coli and low quantities of Enterococcus. There were multiple resistance genes detected. I put Neosporin on him last week although this does not seem to have helped. The wound is slightly deeper today. Offloading continues to be an issue here although with the amount of drainage she has Max total contact cast is just not going to work 2/10; moderate amount of drainage. Patient reports he cannot get his stocking on over the dressing. I told him we have to do that the nurse gave him suggestions on how to make this work. The wound is on the bottom and lateral part of his left foot. Is cultured predominantly grew low amounts of Enterococcus, E. coli and anaerobes. There were multiple resistance genes detected including extended spectrum beta lactamase, quinolone, tetracycline. I could not think of an easy oral combination to address this so for now I am going to do topical antibiotics provided by [facility] I think the main agents here are vancomycin and an aminoglycoside. We have to be able to give him access to the wounds to get the topical antibiotic on 2/17; moderate amount of drainage this is unchanged. He has his Keystone topical antibiotic against the deep tissue culture organisms. He has been using this and changing the dressing daily. Silver alginate on the wound surface. 2/24; using Keystone antibiotic with silver alginate on the top. He had too much drainage for Max total contact  cast at one point although I think that is improving and I think in the  next week or 2 it might be possible to replace Max total contact cast I did not do this today. In general the wound surface looks healthy however he continues to have thick rims of skin and subcutaneous tissue around the wide area of the circumference which I debrided 06/04/2020 upon evaluation today patient appears to be doing well in regard to his wound. I do feel like he is showing signs of improvement. There is little bit of callus and dead tissue around the edges of the wound as well as what appears to be Max little bit of Max sinus tract that is off to the side laterally I would perform debridement to clear that away today. 3/17; left lateral foot. The wound looks about the same as I remember. Not much depth surface looks healthy. No evidence of infection 3/25; left lateral foot. Wound surface looks about the same. Separating epithelium from the circumference. There really is no evidence of infection here however not making progress by my view 3/29; left lateral foot. Surface of the wound again looks reasonably healthy still thick skin and subcutaneous tissue around the wound margins. There is no evidence of infection. One of the concerns being brought up by the nurses has again the amount of drainage vis--vis continued use of Max total contact cast 4/5; left lateral foot at roughly the base of the fifth metatarsal. Nice healthy looking granulated tissue with rims of epithelialization. The overall wound measurements are not any better but the tissue looks healthy. The only concern is the amount of drainage although he has no surrounding maceration with what we have been doing recently to absorb fluid and protect his skin. He also has lymphedema. 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 He states that the kerlix/coban under the TCC rolled down and could not get it back up. He has been using an offloading scooter and has somehow hurt his right foot using this device. This happened last week. He states that the side of his right foot developed Max blister and opened. The top of his foot also has Max few small open wounds he thinks is due to his socks rubbing in his shoes. He has not been using any dressings to the wound. He denies purulent drainage, fever/chills or erythema to the wounds. 4/22; patient presents for 1 week follow-up. He developed new wounds to the right foot that were evaluated at last clinic visit. He continues to have Max total contact cast to the left leg and he reports no issues. He has been using silver collagen to the right foot wounds with no issues. He denies purulent drainage, fever/chills or erythema to the right foot wounds. He has no complaints today 4/25; patient presents for 1 week follow-up. He has Max total contact cast of the left leg and reports no issues. He has been using silver alginate to the right foot wound. He denies purulent drainage, fever/chills or erythema to the right foot wounds. 5/2 patient presents for 1 week follow-up. T contact cast on the left. The wound which is on the base of the plantar foot at the base of the fifth metatarsal otal actually looks quite good and dimensions continue to gradually contract. HOWEVER the area on the  right lateral fifth metatarsal head is much larger than what I remember from 2 weeks ago. Once more is he has significant levels of hypergranulation. Noteworthy that he had this same hyper granulated response on his wound on the left foot at one point in time. So much so that he I thought there was an underlying fluid collection. Based on this I think this just needs debridement. 5/9; the wound on the left actually  continues to be gradually smaller with Max healthy surface. Slight amount of drainage and maceration of the skin around but not too bad. However he has Max large wound over the right fifth metatarsal head very much in the same configuration as his left foot wound was initially. I used silver nitrate to address the hyper granulated tissue no mechanical debridement 5/16; area on the left foot did not look as healthy this week deeper thick surrounding macerated skin and subcutaneous tissue. The area on the right foot fifth met head was about the same The area on the right ankle that we identified last week is completely broken down into an open wound presumably Max stocking rubbing issue 5/23; patient has been using Max total contact cast to the left side. He has been using silver alginate underneath. He has also been using silver alginate to the right foot wounds. He has no complaints today. He denies any signs of infection. 5/31; the left-sided wound looks some better measure smaller surface granulation looks better. We have been using silver alginate under the total contact cast The large area on his right fifth met head and right dorsal foot look about the same still using silver alginate 6/6; neither side is good as I was hoping although the surface area dimensions are better. Max lot of maceration on his left and right foot around the wound edge. Area on the dorsal right foot looks better. He says he was traveling. I am not sure what does the amount of maceration around the plantar wounds may be drainage issues 6/13; in general the wound surfaces look quite good on both sides. Macerated skin and raised edges around the wound required debridement although in general especially on the left the surface area seems improved. The area on the right dorsal ankle is about the same I thought this would not be such Max problem to close 6/20; not much change in either wound although the one on the right looks Max little  better. Both wounds have thick macerated edges to the skin requiring debridements. We have been using silver alginate. The area on the dorsal right ankle is still open I thought this would be closed. 6/28; patient comes in today with Max marked deterioration in the right foot wound fifth met head. Wide area of exposed bone this is Max drastic change from last time. The area on the left there we have been casting is stagnant. We have been using silver alginate in both wound areas. 7/5; bone culture I did for PCR last time was positive for Pseudomonas, group B strep, Enterococcus and Staph aureus. There was no suggestion of methicillin resistance or ampicillin resistant genes. This was resistant to tetracycline however He comes into the clinic today with the area over his right plantar fifth metatarsal head which had been doing so well 2 weeks ago completely necrotic feeling bone. I do not know that this is going to be salvageable. The left foot wound is certainly no smaller but it has Max better surface and is superficial. 7/8; patient called  in this morning to say that his total contact cast was rubbing against his foot. He states he is doing fine overall. He denies signs of infection. 7/12; continued deterioration in the wound over the right fifth metatarsal head crumbling bone. This is not going to be salvageable. The patient agrees and wants to be referred to Dr. Doran Durand which we will attempt to arrange as soon as possible. I am going to continue him on antibiotics as long as that takes so I will renew those today. The area on the left foot which is the base of the fifth metatarsal continues to look somewhat better. Healthy looking tissue no depth no debridement is necessary here. 7/20; the patient was kindly seen by Dr. Doran Durand of orthopedics on 10/19/2020. 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 Towards  this end we have taken him out of the total contact cast on the left we will put him in Max wrap with Hydrofera Blue. As I understand things surgery is planned for 7/21 7/27; patient had his surgery last Thursday. 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 Apparently everything went well we did not still disturb that today The area on the left foot actually looks quite good. He has been much less mobile which probably explains this he did not seem to do well in the total contact cast secondary to drainage and maceration I think. 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 Fortunately there is no evidence of active infection at this time. No fevers, chills, nausea, vomiting, or diarrhea. Overall I think that he is actually doing extremely well. Nonetheless I do believe that he is staying off of this more following the surgery in his right foot that is the reason the left is doing so great. 8/16; left plantar foot wound. This looks smaller than the last time I saw this he is using Hydrofera Blue. The surgical wound on the right foot is being followed by Dr. Doran Durand we did not look at this today. He has surgical shoes on both feet 8/23; left plantar foot wound not as good this week. Surrounding macerated skin and subcutaneous tissue everything looks moist and wet. I do not think he is offloading this adequately. He is using Max surgical shoe Apparently the right foot surgical wound is not open although I did not check his foot 8/31; left plantar foot lateral aspect. Much improved this week. He has no maceration. Some improvement in the surface area of the wound but most impressively the depth is come in we are using silver alginate. The patient is Max Product/process development scientist. He is asked that we write him Max letter so he can go back to work. I have also tried to see if we can write something that will  allow him to limit the amount of time that he is on his foot at work. 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 Hopefully the latter can be avoided 9/6; I believe the patient missed an appointment last week. He arrives in today with Max wound looking roughly the same certainly no better. Undermining laterally and also inferiorly. We used molecuLight today in training with the patient's permission.. We are  using silver alginate 9/21 wound is measuring bigger this week although this may have to do with the aggressive circumferential debridement last week in response to the blush fluorescence on the MolecuLight. Culture I did last week showed significant MSSA and E. coli. I put him on Augmentin but he has not started it yet. We are also going to send this for compounded antibiotics at [facility]. There is no evidence of systemic infection 9/29; silver alginate. His Keystone arrived. He is completing Augmentin in 2 days. Offloading in Max cam boot. Moderate drainage per our intake staff 10/5; using silver alginate. He has been using his [place]. He has completed his Augmentin. Per our intake nurse still Max lot of drainage, far too much to consider Max total contact cast. Wound measures about the same. He had the same undermining area that I defined last week from Max roughly 11-3. I remove this today 10/12; using silver alginate he is using the [place]. He comes in for Max nurse visit hence we are applying [employer] twice Max week. Measuring slightly better today and less notable drainage. Extensive debridement of the wound edge last time 10/18; using topical Keystone and silver alginate and Max soft cast. Wound measurements about the same. Drainage was through his soft cast. We are changing this twice Max week Tuesdays and Friday 10/25; comes in with moderate drainage. Still using Keystone silver alginate and Max soft cast. Wound dimensions  completely the same.He has Max lot of edema in the left leg he has lymphedema. Asking for [name] to consider wrapping him as he cannot get his stocking on over the soft cast 11/2; comes in with moderate to large drainage slightly smaller in terms of width we have been using [place]. His wound looks satisfactory but not much improvement Electronic Signature(s) Signed: 02/04/2021 5:02:06 PM By: Max Ham MD Entered By: [name] on 02/03/2021 08:33:18 -------------------------------------------------------------------------------- Physical Exam Details Patient Name: Date of Service: [name], [name] 02/03/2021 7:30 [name] Medical Record Number: [id number] Patient Account Number: [id number] Date of Birth/Sex: Treating RN: [birth year] (35 y.o. [name] Primary Care Provider: Seward [name] Other Clinician: Referring Provider: Treating Provider/Extender: [name] in Treatment: 66 Constitutional Sitting or standing Blood Pressure is within target range for patient.. Pulse regular and within target range for patient.. Temperature is normal and within the target range for the patient.Marland Kitchen Appears in no distress. Notes Wound exam; left plantar foot lateral aspect at the base of the fifth metatarsal. Slightly smaller there is Max rim of epithelialization coming from the lateral margin of the wound although not much change in overall size. There is no evidence of surrounding infection. Although its not completely obvious with Max curette there is Max fibrinous surface here therefore I went ahead with Max subcutaneous debridement hemostasis with Max pressure dressing Electronic Signature(s) Signed: 02/04/2021 5:02:06 PM By: Max Ham MD Entered By: [name] on 02/03/2021 08:34:34 -------------------------------------------------------------------------------- Physician Orders Details Patient Name: Date of Service: [name], [name] 02/03/2021 7:30 [name] Medical  Record Number: [id number] Patient Account Number: [id number] Date of Birth/Sex: Treating RN: 03-25-1986 (35 y.o. [name] Primary Care Provider: Seward [name] Other Clinician: Referring Provider: Treating Provider/Extender: [name] in Treatment: 43 Verbal / Phone Orders: No Diagnosis Coding ICD-10 Coding Code Description E11.621 Type 2 diabetes mellitus with foot ulcer L97.528 Non-pressure chronic ulcer of other part of left foot with other specified severity Follow-up Appointments ppointment in 1 week. - with  Dr. Dellia Nims Return Max Other: - MD visit Friday 11/4 for cast change Edema Control - Lymphedema / SCD / Other Bilateral Lower Extremities Elevate legs to the level of the heart or above for 30 minutes daily and/or when sitting, Max frequency of: - throughout the day Avoid standing for long periods of time. Exercise regularly Moisturize legs daily. - right leg every night before bed. Compression stocking or Garment 20-30 mm/Hg pressure to: - Apply to right leg in the morning and remove at night. Off-Loading Total Contact Cast to Left Lower Extremity Open toe surgical shoe to: - Left Foot with soft cast Other: - minimal weight bearing left foot Additional Orders / Instructions Follow Nutritious Diet Wound Treatment Wound #3 - Foot Wound Laterality: Left, Lateral Cleanser: Soap and Water 2 x Per Week/30 Days Discharge Instructions: May shower and wash wound with dial antibacterial soap and water prior to dressing change. Cleanser: Wound Cleanser (Generic) 2 x Per Week/30 Days Discharge Instructions: Cleanse the wound with wound cleanser prior to applying Max clean dressing using gauze sponges, not tissue or cotton balls. Peri-Wound Care: Zinc Oxide Ointment 30g tube 2 x Per Week/30 Days Discharge Instructions: Apply Zinc Oxide to periwound with each dressing change as needed. Topical: keystone antibiotic compound 2 x Per Week/30 Days Discharge  Instructions: thin layer to wound bed Prim Dressing: KerraCel Ag Gelling Fiber Dressing, 4x5 in (silver alginate) (Generic) 2 x Per Week/30 Days ary Discharge Instructions: Apply silver alginate to wound bed as instructed Secondary Dressing: ABD Pad, 8x10 2 x Per Week/30 Days Discharge Instructions: Apply over primary dressing as directed. Secondary Dressing: Zetuvit Plus 4x8 in (Generic) 2 x Per Week/30 Days Discharge Instructions: Apply over primary dressing as directed. Compression Wrap: Kerlix Roll 4.5x3.1 (in/yd) 2 x Per Week/30 Days Discharge Instructions: Apply Kerlix and Coban compression as directed. Compression Wrap: Coban Self-Adherent Wrap 4x5 (in/yd) 2 x Per Week/30 Days Discharge Instructions: Apply over Kerlix as directed. Electronic Signature(s) Signed: 02/04/2021 5:02:06 PM By: Max Ham MD Signed: 02/04/2021 5:51:35 PM By: Max Hurst RN, BSN Entered By: [name] on 02/03/2021 08:49:34 -------------------------------------------------------------------------------- Problem List Details Patient Name: Date of Service: [name], [name] 02/03/2021 7:30 [name] Medical Record Number: [id number] Patient Account Number: [id number] Date of Birth/Sex: Treating RN: 05/24/1985 (35 y.o. [name] Primary Care Provider: Seward [name] Other Clinician: Referring Provider: Treating Provider/Extender: [name] in Treatment: 66 Active Problems ICD-10 Encounter Code Description Active Date MDM Diagnosis E11.621 Type 2 diabetes mellitus with foot ulcer 10/24/2019 No Yes L97.528 Non-pressure chronic ulcer of other part of left foot with other specified 10/24/2019 No Yes severity Inactive Problems ICD-10 Code Description Active Date Inactive Date L97.518 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ity 10/24/2019 10/24/2019 L97.518 Non-pressure chronic ulcer of other part of right foot with other  specified severity 07/14/2020 07/14/2020 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, right ankle and foot 10/24/2019 10/24/2019 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 08/10/2020 08/10/2020 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, left ankle and foot 10/24/2019 10/24/2019 B95.62 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 10/24/2019 10/24/2019 classified elsewhere Resolved Problems Electronic Signature(s) Signed: 02/04/2021 5:02:06 PM By: Max Ham MD Entered By: [name] on 02/03/2021 08:31:33 -------------------------------------------------------------------------------- Progress Note Details Patient Name: Date of Service: [name], [name] 02/03/2021 7:30 [name] Medical Record Number: [id number] Patient Account Number: [id number] Date of Birth/Sex: Treating RN: 1985/11/07 (35 y.o. 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 He comes in for review of Max wound over the plantar fifth metatarsal head extending into the lateral part of the foot. He was followed for this previously by his podiatrist Dr. Cornelius [name]. As the patient tells his story he went to see podiatry first for Max swelling he developed on the lateral part of his fifth metatarsal head in May. He states this was "open" by podiatry and the area closed. He was followed up in June and it was again opened callus removed and it closed promptly. There were plans being made for surgery on the fifth metatarsal head in June however his blood sugar was apparently too high for anesthesia. Apparently the area was debrided and opened again in June and it is never closed since. Looking over the  records from podiatry I am really not able to follow this. It was clear when he was first seen it was before 5/14 at that point he already had Max wound. By 5/17 the ulcer was resolved. I do not see anything about Max procedure. On 5/28 noted to have pre-ulcerative moderate keratosis. X-ray noted 1/5 contracted toe and tailor's bunion and metatarsal deformity. On Max visit date on 09/28/2018 the dorsal part of the left foot it healed and resolved. There was concern about swelling in his lower extremity he was sent to the ER.. As far as I can tell he was seen in the ER on 7/12 with an ulcer on his left foot. Max DVT rule out of the left leg was negative. I do not think I have complete records from podiatry but I am not able to verify the procedures this patient states he had. He states after the last procedure the wound has never closed although I am not able to follow this in the records I have from podiatry. He has not had Max recent x-ray The patient has been using Neosporin on the wound. He is wearing Max Darco shoe. He is still very active up on his foot working and exercising. Past medical history; type 2 diabetes ketosis-prone, leg swelling with Max negative DVT study in July. Non-smoker ABI in our clinic was 0.85 on the left 10/16; substantial wound on the plantar left fifth met head extending laterally almost to the dorsal fifth MTP. We have been using silver alginate we gave him Max Darco forefoot off loader. An x-ray did not show evidence of osteomyelitis did note soft tissue emphysema which I think was due to gas tracking through an open wound. There is no doubt in my mind he requires an MRI 10/23; MRI not booked until 3 November at the earliest this is largely due to his glucose sensor in the right arm. We have been using silver alginate. There has been an improvement 10/29; I am still not exactly sure when his MRI is booked for. He says it is the third but it is the 10th in epic. This definitely needs to  be done. He is running Max low-grade fever today but no other symptoms. No real improvement in the 1 02/26/2019 patient presents today for Max follow-up visit here in our clinic he is last been seen in the clinic on October 29. Subsequently we were working on getting MRI to evaluate and see what exactly  was going on and where we would need to go from the standpoint of whether or not he had osteomyelitis and again what treatments were going be required. Subsequently the patient ended up being admitted to the hospital on 02/07/2019 and was discharged on 02/14/2019. This is Max somewhat interesting admission with Max discharge diagnosis of pneumonia due to COVID-19 although he was positive for COVID-19 when tested at the urgent care but negative x2 when he was actually in the hospital. With that being said he did have acute respiratory failure with hypoxia and it was noted he also have Max left foot ulceration with osteomyelitis. With that being said he did require oxygen for his pneumonia and I level 4 L. He was placed on antivirals and steroids for the COVID-19. He was also transferred to the [facility] at one point. Nonetheless he did subsequently discharged home and since being home has done much better in that regard. The CT angiogram did not show any pulmonary embolism. With regard to the osteomyelitis the patient was placed on vancomycin and Zosyn while in the hospital but has been changed to Augmentin at discharge. It was also recommended that he follow- up with wound care and podiatry. Podiatry however wanted him to see [name] according to the patient prior to them doing anything further. His hemoglobin A1c was 9.9 as noted in the hospital. Have an MRI of the left foot performed while in the hospital on 02/04/2019. 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 There is an overlying plantar open wound noted an  abscess tracking back along the lateral aspect of the fifth metatarsal shaft. There is otherwise diffuse cellulitis and mild fasciitis without findings of polymyositis. The patient did have recently pneumonia secondary to COVID-19 I looked in the chart through epic and it does appear that the patient may need to have an additional x-ray just to ensure everything is cleared and that he has no airspace disease prior to putting him into the [name]. 03/05/2019; patient was readmitted to the clinic last week. He was hospitalized twice for Max viral upper respiratory tract infection from 11/1 through 11/4 and then 11/5 through 11/12 ultimately this turned out to be Covid pneumonitis. Although he was discharged on oxygen he is not using it. He says he feels fine. He has no exercise limitation no cough no sputum. His O2 sat in our clinic today was 100% on room air. 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 He received Vanco and Zosyn in the hospital and then was discharged on 2 weeks of Augmentin. I do not see any relevant cultures. He was supposed to follow-up with infectious disease but I do not see that he has an appointment. 12/8; patient saw Dr. Novella Olive of infectious disease last week. He felt that he had had adequate antibiotic therapy. He did not go to follow-up with Dr. Amalia Hailey of podiatry and I have again talked to him about the pros and cons of this. He does not want to consider Max ray amputation of this time. He is aware of the risks of recurrence, migration etc. He started HBO today and tolerated this well. He can complete the Augmentin that I gave him last week. I have looked over the lab work that Dr. Chana Bode ordered his C-reactive protein was 3.3 and his  sedimentation rate was 17. The C-reactive protein is never really been measurably that high in this patient 12/15; not much change in  the wound today however he has undermining along the lateral part of the foot again more extensively than last week. He has some rims of epithelialization. We have been using silver alginate. He is undergoing hyperbarics but did not dive today 12/18; in for his obligatory first total contact cast change. Unfortunately there was pus coming from the undermining area around his fifth metatarsal head. This was cultured but will preclude reapplication of Max cast. He is seen in conjunction with HBO 12/24; patient had staph lugdunensis in the wound in the undermining area laterally last time. We put him on doxycycline which should have covered this. The wound looks better today. I am going to give him another week of doxycycline before reattempting the total contact cast 12/31; the patient is completing antibiotics. Hemorrhagic debris in the distal part of the wound with some undermining distally. He also had hyper granulation. Extensive debridement with Max #5 curette. The infected area that was on the lateral part of the fifth met head is closed over. I do not think he needs any more antibiotics. Patient was seen prior to HBO. Preparations for Max total contact cast were made in the cast will be placed post hyperbarics 04/11/19; once again the patient arrives today without complaint. He had been in Max cast all week noted that he had heavy drainage this week. This resulted in large raised areas of macerated tissue around the wound 1/14; wound bed looks better slightly smaller. Hydrofera Blue has been changing himself. He had Max heavy drainage last week which caused Max lot of maceration around the wound so I took him out of Max total contact cast he says the drainage is actually better this week He is seen today in conjunction with HBO 1/21; returns to clinic. He was up in [place] for Max day or 2 attending Max funeral. He comes back in with the wound larger and with Max large area of exposed bone. He had osteomyelitis and  septic arthritis of the fifth left metatarsal head while he was in hospital. He received IV antibiotics in the hospital for Max prolonged period of time then 3 weeks of Augmentin. Subsequently I gave him 2 weeks of doxycycline for more superficial wound infection. When I saw this last week the wound was smaller the surface of the wound looks satisfactory. 1/28; patient missed hyperbarics today. Bone biopsy I did last time showed Enterococcus faecalis and Staphylococcus lugdunensis . He has Max wide area of exposed bone. We are going to use silver alginate as of today. I had another ethical discussion with the patient. 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 In this situation I think the likelihood of healing this is low. Therefore I have recommended Max ray amputation and with the patient's agreement I have referred him to Dr. Doran Durand. The other issue is that his compliance with hyperbarics has been minimal because of his work schedule and given his underlying decision I am going to stop this today READMISSION 10/24/2019 MRI 09/29/2019 left foot IMPRESSION: 1. Apparent skin ulceration inferior and lateral to the 5th metatarsal base with underlying heterogeneous T2 signal and enhancement in the subcutaneous fat. Small peripherally enhancing fluid collections along the plantar and lateral aspects of the 5th metatarsal base suspicious for abscesses. 2. Interval amputation through the mid 5th metatarsal with nonspecific low-level marrow edema and enhancement.  Given the proximity to the adjacent soft tissue inflammatory changes, osteomyelitis cannot be excluded. 3. The additional bones appear unremarkable. MRI 09/29/2019 right foot IMPRESSION: 1. Soft tissue ulceration lateral to the 5th MTP joint. There is low-level T2 hyperintensity within the 4th and 5th metatarsal heads and adjacent proximal phalanges without abnormal T1 signal or cortical destruction. These  findings are nonspecific and could be seen with early marrow edema, hyperemia or early osteomyelitis. No evidence of septic joint. 2. Mild tenosynovitis and synovial enhancement associated with the extensor digitorum tendons at the level of the midfoot. 3. Diffuse low-level muscular T2 hyperintensity and enhancement, most consistent with diabetic myopathy. LEFT FOOT BONE Methicillin resistant staphylococcus aureus Staphylococcus lugdunensis MIC MIC CIPROFLOXACIN >=8 RESISTANT Resistant <=0.5 SENSI... Sensitive CLINDAMYCIN <=0.25 SENS... Sensitive >=8 RESISTANT Resistant ERYTHROMYCIN >=8 RESISTANT Resistant >=8 RESISTANT Resistant GENTAMICIN <=0.5 SENSI... Sensitive <=0.5 SENSI... Sensitive Inducible Clindamycin NEGATIVE Sensitive NEGATIVE Sensitive OXACILLIN >=4 RESISTANT Resistant 2 SENSITIVE Sensitive RIFAMPIN <=0.5 SENSI... Sensitive <=0.5 SENSI... Sensitive TETRACYCLINE <=1 SENSITIVE Sensitive <=1 SENSITIVE Sensitive TRIMETH/SULFA <=10 SENSIT Sensitive <=10 SENSIT Sensitive ... Marland Kitchen.. VANCOMYCIN 1 SENSITIVE Sensitive <=0.5 SENSI... Sensitive Right foot bone . Component 3 wk ago Specimen Description BONE Special Requests RIGHT 4 METATARSAL SAMPLE B Gram Stain NO WBC SEEN NO ORGANISMS SEEN Culture RARE METHICILLIN RESISTANT STAPHYLOCOCCUS AUREUS NO ANAEROBES ISOLATED Performed at [facility], [place] [address], [place], [place] [address] Report Status 10/08/2019 FINAL Organism ID, Bacteria METHICILLIN RESISTANT STAPHYLOCOCCUS AUREUS Resulting Agency [facility] Susceptibility Methicillin resistant staphylococcus aureus MIC CIPROFLOXACIN >=8 RESISTANT Resistant CLINDAMYCIN <=0.25 SENS... Sensitive ERYTHROMYCIN >=8 RESISTANT Resistant GENTAMICIN <=0.5 SENSI... Sensitive Inducible Clindamycin NEGATIVE Sensitive OXACILLIN >=4 RESISTANT Resistant RIFAMPIN <=0.5 SENSI... Sensitive TETRACYCLINE <=1 SENSITIVE Sensitive TRIMETH/SULFA <=10 SENSIT Sensitive ... VANCOMYCIN 1  SENSITIVE Sensitive This is Max patient we had in clinic earlier this year with Max wound over his left fifth metatarsal head. 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 Doran Durand on 05/16/2019. He seems to have developed problems with wounds on his bilateral feet in June 2021 although he may have had problems earlier than this. He was in an urgent care with Max right foot ulcer on 09/26/2019 and given Max course of doxycycline. This was apparently after having trouble getting into see orthopedics. He was seen by podiatry on 09/28/2019 noted to have bilateral lower extremity ulcers including the left lateral fifth metatarsal base and the right subfifth met head. It was noted that had purulent drainage at that time. He required hospitalization from 6/20 through 7/2. This was because of worsening right foot wounds. He underwent bilateral operative incision and drainage and bone biopsies bilaterally. Culture results are listed above. He has been referred back to clinic by Dr. Jacqualyn Posey of podiatry. He is also followed by Dr. Megan Salon who saw him yesterday. He was discharged from hospital on Zyvox Flagyl and Levaquin and yesterday changed to doxycycline Flagyl and Levaquin. His inflammatory markers on 6/26 showed Max sedimentation rate of 129 and Max C-reactive protein of 5. This is improved to 14 and 1.3 respectively. This would indicate improvement. ABIs in our clinic today were 1.23 on the right and 1.20 on the left 11/01/2019 on evaluation today patient appears to be doing fairly well in regard to the  wounds on his feet at this point. Fortunately there is no signs of active infection at this time. No fevers, chills, nausea, vomiting, or  diarrhea. He currently is seeing infectious disease and still under their care at this point. Subsequently he also has both wounds which she has not been using collagen on as he did not receive that in his packaging he did not call us and let us know that. Apparently that just was missed on the order. Nonetheless we will get that straightened out today. 8/9-Patient returns for bilateral foot wounds, using Prisma with hydrogel moistened dressings, and the wounds appear stable. Patient using surgical shoes, avoiding much pressure or weightbearing as much as possible 8/16; patient has bilateral foot wounds. 1 on the right lateral foot proximally the other is on the left mid lateral foot. Both required debridement of callus and thick skin around the wounds. We have been using silver collagen 8/27; patient has bilateral lateral foot wounds. The area on the left substantially surrounded by callus and dry skin. This was removed from the wound edge. The underlying wound is small. The area on the right measured somewhat smaller today. 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 Unfortunately I did not update his antibiotics during today's visit. 9/10 I reviewed Dr. Hale Bogus last notes he felt he had completed antibiotics his inflammatory markers were reasonably well controlled. He has Max small wound on the lateral left foot and Max tiny area on the right which is just above closed. He is using Hydrofera Blue with border foam he has bilateral surgical shoes 9/24; 2 week f/u. doing well. right foot is closed. left foot still undermined. 10/14; right foot remains closed at the fifth met head. The area over the base of the left fifth metatarsal has Max small open area but considerable undermining towards the plantar foot. Thick callus skin around this suggests an adequate pressure relief. We have talked about this. He says he is going to go back into  his cam boot. I suggested Max total contact cast he did not seem enamored with this suggestion 10/26; left foot base of the fifth metatarsal. Same condition as last time. He has skin over the area with an open wound however the skin is not adherent. He went to see Dr. Earleen Newport who did an x-ray and culture of his foot I have not reviewed the x-ray but the patient was not told anything. He is on doxycycline 11/11; since the patient was last here he was in the emergency room on 10/30 he was concerned about swelling in the left foot. They did not do any cultures or x-rays. They changed his antibiotics to cephalexin. Previous culture showed group B strep. The cephalexin is appropriate as doxycycline has less than predictable coverage. Arrives in clinic today with swelling over this area under the wound. He also has Max new wound on the right fifth metatarsal head 11/18; the patient has Max difficult wound on the lateral aspect of the left fifth metatarsal head. The wound was almost ballotable last week I opened it slightly expecting to see purulence however there was just bleeding. I cultured this this was negative. X-ray unchanged. We are trying to get an MRI but I am not sure were going to be able to get this through his insurance. He also has an area on the right lateral fifth metatarsal head this looks healthier 12/3; the patient finally got our MRI. Surprisingly this  did not show osteomyelitis. I did show the soft tissue ulceration at the lateral plantar aspect of the fifth metatarsal base with Max tiny residual 6 mm abscess overlying the superficial fascia I have tried to culture this area I have not been able to get this to grow anything. Nevertheless the protruding tissue looks aggravated. I suspect we should try to treat the underlying "abscess with broad-spectrum antibiotics. I am going to start him on Levaquin and Flagyl. He has much less edema in his legs and I am going to continue to wrap his legs and  see him weekly 12/10. I started Levaquin and Flagyl on him last week. He just picked up the Flagyl apparently there was some delay. The worry is the wound on the left fifth metatarsal base which is substantial and worsening. His foot looks like he inverts at the ankle making this Max weightbearing surface. Certainly no improvement in fact I think the measurements of this are somewhat worse. We have been using 12/17; he apparently just got the Levaquin yesterday this is 2 weeks after the fact. He has completed the Flagyl. The area over the left fifth metatarsal base still has protruding granulation tissue although it does not look quite as bad as it did some weeks ago. He has severe bilateral lymphedema although we have not been treating him for wounds on his legs this is definitely going to require compression. There was so much edema in the left I did not wish to put him in Max total contact cast today. I am going to increase his compression from 3-4 layer. The area on the right lateral fifth met head actually look quite good and superficial. 12/23; patient arrived with callus on the right fifth met head and the substantial hyper granulated callused wound on the base of his fifth metatarsal. He says he is completing his Levaquin in 2 days but I do not think that adds up with what I gave him but I will have to double check this. We are using Hydrofera Blue on both areas. My plan is to put the left leg in Max cast the week after New Year's 04/06/2020; patient's wounds about the same. Right lateral fifth metatarsal head and left lateral foot over the base of the fifth metatarsal. There is undermining on the left lateral foot which I removed before application of total contact cast continuing with Hydrofera Blue new. Patient tells me he was seen by endocrinology today lab work was done [Dr. Kerr]. Also wondering whether he was referred to cardiology. I went over some lab work from previously does not have  chronic renal failure certainly not nephrotic range proteinuria he does have very poorly controlled diabetes but this is not his most updated lab work. Hemoglobin A1c has been over 11 1/10; the patient had Max considerable amount of leakage towards mid part of his left foot with macerated skin however the wound surface looks better the area on the right lateral fifth met head is better as well. I am going to change the dressing on the left foot under the total contact cast to silver alginate, continue with Hydrofera Blue on the right. 1/20; patient was in the total contact cast for 10 days. Considerable amount of drainage although the skin around the wound does not look too bad on the left foot. The area on the right fifth metatarsal head is closed. Our nursing staff reports large amount of drainage out of the left lateral foot wound 1/25; continues with copious  amounts of drainage described by our intake staff. PCR culture I did last week showed E. coli and Enterococcus faecalis and low quantities. Multiple resistance genes documented including extended spectrum beta lactamase, MRSA, MRSE, quinolone, tetracycline. The wound is not quite as good this week as it was 5 days ago but about the same size 2/3; continues with copious amounts of malodorous drainage per our intake nurse. The PCR culture I did 2 weeks ago showed E. coli and low quantities of Enterococcus. There were multiple resistance genes detected. I put Neosporin on him last week although this does not seem to have helped. The wound is slightly deeper today. Offloading continues to be an issue here although with the amount of drainage she has Max total contact cast is just not going to work 2/10; moderate amount of drainage. Patient reports he cannot get his stocking on over the dressing. I told him we have to do that the nurse gave him suggestions on how to make this work. The wound is on the bottom and lateral part of his left foot. Is  cultured predominantly grew low amounts of Enterococcus, E. coli and anaerobes. There were multiple resistance genes detected including extended spectrum beta lactamase, quinolone, tetracycline. I could not think of an easy oral combination to address this so for now I am going to do topical antibiotics provided by [facility] I think the main agents here are vancomycin and an aminoglycoside. We have to be able to give him access to the wounds to get the topical antibiotic on 2/17; moderate amount of drainage this is unchanged. He has his Keystone topical antibiotic against the deep tissue culture organisms. He has been using this and changing the dressing daily. Silver alginate on the wound surface. 2/24; using Keystone antibiotic with silver alginate on the top. He had too much drainage for Max total contact cast at one point although I think that is improving and I think in the next week or 2 it might be possible to replace Max total contact cast I did not do this today. In general the wound surface looks healthy however he continues to have thick rims of skin and subcutaneous tissue around the wide area of the circumference which I debrided 06/04/2020 upon evaluation today patient appears to be doing well in regard to his wound. I do feel like he is showing signs of improvement. There is little bit of callus and dead tissue around the edges of the wound as well as what appears to be Max little bit of Max sinus tract that is off to the side laterally I would perform debridement to clear that away today. 3/17; left lateral foot. The wound looks about the same as I remember. Not much depth surface looks healthy. No evidence of infection 3/25; left lateral foot. Wound surface looks about the same. Separating epithelium from the circumference. There really is no evidence of infection here however not making progress by my view 3/29; left lateral foot. Surface of the wound again looks reasonably healthy still thick  skin and subcutaneous tissue around the wound margins. There is no evidence of infection. One of the concerns being brought up by the nurses has again the amount of drainage vis--vis continued use of Max total contact cast 4/5; left lateral foot at roughly the base of the fifth metatarsal. Nice healthy looking granulated tissue with rims of epithelialization. The overall wound measurements are not any better but the tissue looks healthy. The only concern is the amount of  drainage although he has no surrounding maceration with what we have been doing recently to absorb fluid and protect his skin. He also has lymphedema. 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 He states that the kerlix/coban under the TCC rolled down and could not get it back up. He has been using an offloading scooter and has somehow hurt his right foot using this device. This happened last week. He states that the side of his right foot developed Max blister and opened. The top of his foot also has Max few small open wounds he thinks is due to his socks rubbing in his shoes. He has not been using any dressings to the wound. He denies purulent drainage, fever/chills or erythema to the wounds. 4/22; patient presents for 1 week follow-up. He developed new wounds to the right foot that were evaluated at last clinic visit. He continues to have Max total contact cast to the left leg and he reports no issues. He has been using silver collagen to the right foot wounds with no issues. He denies purulent drainage, fever/chills or erythema to the right foot wounds. He has no complaints today 4/25; patient presents for 1 week follow-up. He has Max total contact cast of the left leg and reports no issues. He  has been using silver alginate to the right foot wound. He denies purulent drainage, fever/chills or erythema to the right foot wounds. 5/2 patient presents for 1 week follow-up. T contact cast on the left. The wound which is on the base of the plantar foot at the base of the fifth metatarsal otal actually looks quite good and dimensions continue to gradually contract. HOWEVER the area on the right lateral fifth metatarsal head is much larger than what I remember from 2 weeks ago. Once more is he has significant levels of hypergranulation. Noteworthy that he had this same hyper granulated response on his wound on the left foot at one point in time. So much so that he I thought there was an underlying fluid collection. Based on this I think this just needs debridement. 5/9; the wound on the left actually continues to be gradually smaller with Max healthy surface. Slight amount of drainage and maceration of the skin around but not too bad. However he has Max large wound over the right fifth metatarsal head very much in the same configuration as his left foot wound was initially. I used silver nitrate to address the hyper granulated tissue no mechanical debridement 5/16; area on the left foot did not look as healthy this week deeper thick surrounding macerated skin and subcutaneous tissue. oo The area on the right foot fifth met head was about the same oo The area on the right ankle that we identified last week is completely broken down into an open wound presumably Max stocking rubbing issue 5/23; patient has been using Max total contact cast to the left side. He has been using silver alginate underneath. He has also been using silver alginate to the right foot wounds. He has no complaints today. He denies any signs of infection. 5/31; the left-sided wound looks some better measure smaller surface granulation looks better. We have been using silver alginate under the total contact cast oo The  large area  on his right fifth met head and right dorsal foot look about the same still using silver alginate 6/6; neither side is good as I was hoping although the surface area dimensions are better. Max lot of maceration on his left and right foot around the wound edge. Area on the dorsal right foot looks better. He says he was traveling. I am not sure what does the amount of maceration around the plantar wounds may be drainage issues 6/13; in general the wound surfaces look quite good on both sides. Macerated skin and raised edges around the wound required debridement although in general especially on the left the surface area seems improved. oo The area on the right dorsal ankle is about the same I thought this would not be such Max problem to close 6/20; not much change in either wound although the one on the right looks Max little better. Both wounds have thick macerated edges to the skin requiring debridements. We have been using silver alginate. The area on the dorsal right ankle is still open I thought this would be closed. 6/28; patient comes in today with Max marked deterioration in the right foot wound fifth met head. Wide area of exposed bone this is Max drastic change from last time. The area on the left there we have been casting is stagnant. We have been using silver alginate in both wound areas. 7/5; bone culture I did for PCR last time was positive for Pseudomonas, group B strep, Enterococcus and Staph aureus. There was no suggestion of methicillin resistance or ampicillin resistant genes. This was resistant to tetracycline however He comes into the clinic today with the area over his right plantar fifth metatarsal head which had been doing so well 2 weeks ago completely necrotic feeling bone. I do not know that this is going to be salvageable. The left foot wound is certainly no smaller but it has Max better surface and is superficial. 7/8; patient called in this morning to say that his total contact cast  was rubbing against his foot. He states he is doing fine overall. He denies signs of infection. 7/12; continued deterioration in the wound over the right fifth metatarsal head crumbling bone. This is not going to be salvageable. The patient agrees and wants to be referred to Dr. Doran Durand which we will attempt to arrange as soon as possible. I am going to continue him on antibiotics as long as that takes so I will renew those today. The area on the left foot which is the base of the fifth metatarsal continues to look somewhat better. Healthy looking tissue no depth no debridement is necessary here. 7/20; the patient was kindly seen by Dr. Doran Durand of orthopedics on 10/19/2020. 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 Towards this end we have taken him out of the total contact cast on the left we will put him in Max wrap with Hydrofera Blue. As I understand things surgery is planned for 7/21 7/27; patient had his surgery last Thursday. 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 Apparently everything went well we did not still disturb that today The area on the left foot actually looks quite good. He has been much less mobile which probably explains this he did not seem to do well in the total contact cast secondary to drainage and maceration I think. We have been using  Hydrofera Blue 11/09/2020 upon evaluation today patient appears to be doing well with regard to his plantar foot ulcer on the left foot. Fortunately there is no evidence of active infection at this time. No fevers, chills, nausea, vomiting, or diarrhea. Overall I think that he is actually doing extremely well. Nonetheless I do believe that he is staying off of this more following the surgery in his right foot that is the reason the left is doing so great. 8/16; left plantar foot wound. This looks smaller than the last time I saw this he is using  Hydrofera Blue. The surgical wound on the right foot is being followed by Dr. Doran Durand we did not look at this today. He has surgical shoes on both feet 8/23; left plantar foot wound not as good this week. Surrounding macerated skin and subcutaneous tissue everything looks moist and wet. I do not think he is offloading this adequately. He is using Max surgical shoe Apparently the right foot surgical wound is not open although I did not check his foot 8/31; left plantar foot lateral aspect. Much improved this week. He has no maceration. Some improvement in the surface area of the wound but most impressively the depth is come in we are using silver alginate. The patient is Max Product/process development scientist. He is asked that we write him Max letter so he can go back to work. I have also tried to see if we can write something that will allow him to limit the amount of time that he is on his foot at work. 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 Hopefully the latter can be avoided 9/6; I believe the patient missed an appointment last week. He arrives in today with Max wound looking roughly the same certainly no better. Undermining laterally and also inferiorly. We used molecuLight today in training with the patient's permission.. We are using silver alginate 9/21 wound is measuring bigger this week although this may have to do with the aggressive circumferential debridement last week in response to the blush fluorescence on the MolecuLight. Culture I did last week showed significant MSSA and E. coli. I put him on Augmentin but he has not started it yet. We are also going to send this for compounded antibiotics at [facility]. There is no evidence of systemic infection 9/29; silver alginate. His Keystone arrived. He is completing Augmentin in 2 days. Offloading in Max cam boot. Moderate drainage per our intake staff 10/5; using silver alginate. He  has been using his [place]. He has completed his Augmentin. Per our intake nurse still Max lot of drainage, far too much to consider Max total contact cast. Wound measures about the same. He had the same undermining area that I defined last week from Max roughly 11-3. I remove this today 10/12; using silver alginate he is using the [place]. He comes in for Max nurse visit hence we are applying [employer] twice Max week. Measuring slightly better today and less notable drainage. Extensive debridement of the wound edge last time 10/18; using topical Keystone and silver alginate and Max soft cast. Wound measurements about the same. Drainage was through his soft cast. We are changing this twice Max week Tuesdays and Friday 10/25; comes in with moderate drainage. Still using Keystone silver alginate and Max soft cast. Wound dimensions completely the same.He has Max lot of edema in the left leg he has lymphedema. Asking for  [name] to consider wrapping him as he cannot get his stocking on over the soft cast 11/2; comes in with moderate to large drainage slightly smaller in terms of width we have been using [place]. His wound looks satisfactory but not much improvement Objective Constitutional Sitting or standing Blood Pressure is within target range for patient.. Pulse regular and within target range for patient.. Temperature is normal and within the target range for the patient.Marland Kitchen Appears in no distress. Vitals Time Taken: 7:50 AM, Height: 77 in, Weight: 280 lbs, BMI: 33.2, Temperature: 98.5 F, Pulse: 90 bpm, Respiratory Rate: 18 breaths/min, Blood Pressure: 133/81 mmHg, Capillary Blood Glucose: 134 mg/dl. General Notes: glucose per pt report General Notes: Wound exam; left plantar foot lateral aspect at the base of the fifth metatarsal. Slightly smaller there is Max rim of epithelialization coming from the lateral margin of the wound although not much change in overall size. There is no evidence of surrounding infection.  Although its not completely obvious with Max curette there is Max fibrinous surface here therefore I went ahead with Max subcutaneous debridement hemostasis with Max pressure dressing Integumentary (Hair, Skin) Wound #3 status is Open. Original cause of wound was Trauma. The date acquired was: 10/02/2019. The wound has been in treatment 66 weeks. The wound is located on the Left,Lateral Foot. The wound measures 3.6cm length x 2.7cm width x 0.5cm depth; 7.634cm^2 area and 3.817cm^3 volume. There is Fat Layer (Subcutaneous Tissue) exposed. There is no tunneling noted, however, there is undermining starting at 2:00 and ending at 5:00 with Max maximum distance of 0.2cm. There is Max medium amount of serosanguineous drainage noted. The wound margin is epibole. There is large (67-100%) red, pink granulation within the wound bed. There is no necrotic tissue within the wound bed. 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 There was Max Excisional Skin/Subcutaneous Tissue Debridement with Max total area of 9.72 sq cm performed by [name]., MD. With the following instrument(s): Curette to remove Non-Viable tissue/material. Material removed includes Subcutaneous Tissue, Slough, and Biofilm. Max time out was conducted at 08:12, prior to the start of the procedure. Max Moderate amount of bleeding was controlled with Pressure. The procedure was tolerated well with Max pain level of 0 throughout and Max pain level of 0 following the procedure. Post Debridement Measurements: 3.6cm length x 2.7cm width x 0.5cm depth; 3.817cm^3 volume. Character of Wound/Ulcer Post Debridement is improved. Severity of Tissue Post  Debridement is: Fat layer exposed. Post procedure Diagnosis Wound #3: Same as Pre-Procedure Pre-procedure diagnosis of Wound #3 is Max Diabetic Wound/Ulcer of the Lower Extremity located on the Left,Lateral Foot . There was Max T Contact Cast otal Procedure by [name]., MD. Post procedure Diagnosis Wound #3: Same as Pre-Procedure Plan Follow-up Appointments: Return Appointment in 1 week. - with Dr. Dellia Nims Other: - MD visit Friday 11/4 for cast change Edema Control - Lymphedema / SCD / Other: Elevate legs to the level of the heart or above for 30 minutes daily and/or when sitting, Max frequency of: - throughout the day Avoid standing for long periods of time. Exercise regularly Moisturize legs daily. - right leg every night before bed. Compression stocking or Garment 20-30 mm/Hg pressure to: -  Apply to right leg in the morning and remove at night. Off-Loading: T Contact Cast to Left Lower Extremity otal Open toe surgical shoe to: - Left Foot with soft cast Other: - minimal weight bearing left foot Additional Orders / Instructions: Follow Nutritious Diet WOUND #3: - Foot Wound Laterality: Left, Lateral Cleanser: Soap and Water 2 x Per Week/30 Days Discharge Instructions: May shower and wash wound with dial antibacterial soap and water prior to dressing change. Cleanser: Wound Cleanser (Generic) 2 x Per Week/30 Days Discharge Instructions: Cleanse the wound with wound cleanser prior to applying Max clean dressing using gauze sponges, not tissue or cotton balls. Peri-Wound Care: Zinc Oxide Ointment 30g tube 2 x Per Week/30 Days Discharge Instructions: Apply Zinc Oxide to periwound with each dressing change as needed. Topical: keystone antibiotic compound 2 x Per Week/30 Days Discharge Instructions: thin layer to wound bed Prim Dressing: KerraCel Ag Gelling Fiber Dressing, 4x5 in (silver alginate) (Generic) 2 x Per Week/30 Days ary Discharge Instructions: Apply silver alginate to wound  bed as instructed Secondary Dressing: Woven Gauze Sponge, Non-Sterile 4x4 in (Generic) 2 x Per Week/30 Days Discharge Instructions: Apply over primary dressing as directed. Secondary Dressing: ABD Pad, 8x10 2 x Per Week/30 Days Discharge Instructions: Apply over primary dressing as directed. Secondary Dressing: Zetuvit Plus 4x8 in (Generic) 2 x Per Week/30 Days Discharge Instructions: Apply over primary dressing as directed. Secured With: [employer], 4.5x3.1 (in/yd) 2 x Per Week/30 Days Discharge Instructions: Secure with Kerlix as directed. Secured With: 38M Medipore H Soft Cloth Surgical T ape, 4 x 10 (in/yd) 2 x Per Week/30 Days Discharge Instructions: Secure with tape as directed. 1. Continue with silver collagen zetuvit ABD total contact cast. 2. We are also putting kerlix and Coban under the cast to control the edema in his leg 3. Still using the compounded Keystone topical antibiotic in reference to prior cultures. 4. The drainage is still too excessive for Max once Max week cast change we will therefore bring him in on Friday morning to change this Electronic Signature(s) Signed: 02/04/2021 5:02:06 PM By: Max Ham MD Entered By: [name] on 02/03/2021 08:35:53 -------------------------------------------------------------------------------- Total Contact Cast Details Patient Name: Date of Service: [name], [name] 02/03/2021 7:30 [name] Medical Record Number: [id number] Patient Account Number: [id number] Date of Birth/Sex: Treating RN: [birth year] (35 y.o. [name] Primary Care Provider: Seward [name] Other Clinician: Referring Provider: Treating Provider/Extender: [name] in Treatment: 66 T Contact Cast Applied for Wound Assessment: otal Wound #3 Left,Lateral Foot Performed By: Physician [name]., MD Post Procedure Diagnosis Same as Pre-procedure Electronic Signature(s) Signed: 02/04/2021 5:02:06 PM By: Max Ham MD Entered By: [name] on 02/03/2021 08:32:41 -------------------------------------------------------------------------------- SuperBill Details Patient Name: Date of Service: [name], [name] 02/03/2021 Medical Record Number: [id number] Patient Account Number: [id number] Date of Birth/Sex: Treating RN: October 29, 1985 (35 y.o. [name] Primary Care Provider: Seward [name] Other Clinician: Referring Provider: Treating Provider/Extender: [name] in Treatment: 66 Diagnosis Coding ICD-10 Codes Code Description E11.621 Type 2 diabetes mellitus with foot ulcer L97.528 Non-pressure chronic ulcer of other part of left foot with other specified severity Facility Procedures CPT4 Code: [id number] Description: 68115 - DEB SUBQ TISSUE 20 SQ CM/< ICD-10 Diagnosis Description E11.621 Type 2 diabetes mellitus with foot ulcer L97.528 Non-pressure chronic ulcer of other part of left foot with other specified seve Modifier: rity Quantity: 1 Physician Procedures : CPT4 Code Description Modifier [phone]  11042 - WC PHYS SUBQ TISS 20 SQ CM ICD-10 Diagnosis Description E11.621 Type 2 diabetes mellitus with foot ulcer L97.528 Non-pressure chronic ulcer of other part of left foot with other specified severity Quantity: 1 Electronic Signature(s) Signed: 02/04/2021 5:02:06 PM By: Max Ham MD Entered By: [name] on 02/03/2021 08:36:09

## 2021-02-04 NOTE — Progress Notes (Signed)
[place], [name] ([id number]) Visit Report for 02/03/2021 Arrival Information Details Patient Name: Date of Service: [name] 02/03/2021 7:30 A M Medical Record Number: [id number] Patient Account Number: [id number] Date of Birth/Sex: Treating RN: 08-06-1985 (35 y.o. [name] Primary Care Nastasha Reising: [name] Other Clinician: Referring Kennede Lusk: Treating Marvin Maenza/Extender: [name] in Treatment: 59 Visit Information History Since Last Visit Added or deleted any medications: No Patient Arrived: Ambulatory Any new allergies or adverse reactions: No Arrival Time: 07:50 Had a fall or experienced change in No Accompanied By: alone activities of daily living that may affect Transfer Assistance: None risk of falls: Patient Identification Verified: Yes Signs or symptoms of abuse/neglect since last visito No Secondary Verification Process Completed: Yes Hospitalized since last visit: No Patient Requires Transmission-Based Precautions: No Implantable device outside of the clinic excluding No Patient Has Alerts: No cellular tissue based products placed in the center since last visit: Has Dressing in Place as Prescribed: Yes Has Compression in Place as Prescribed: Yes Pain Present Now: No Electronic Signature(s) Signed: 02/04/2021 5:51:35 PM By: [name] RN, BSN Entered By: [name] on 02/03/2021 07:50:30 -------------------------------------------------------------------------------- Encounter Discharge Information Details Patient Name: Date of Service: [name], [name] 02/03/2021 7:30 A M Medical Record Number: [id number] Patient Account Number: [id number] Date of Birth/Sex: Treating RN: 1985/09/21 (35 y.o. [name] Primary Care Deshia Vanderhoof: [name] Other Clinician: Referring Claus Silvestro: Treating Laketra Bowdish/Extender: [name] in Treatment: 66 Encounter Discharge Information Items Post  Procedure Vitals Discharge Condition: Stable Temperature (F): 98.5 Ambulatory Status: Ambulatory Pulse (bpm): 90 Discharge Destination: Home Respiratory Rate (breaths/min): 18 Transportation: Private Auto Blood Pressure (mmHg): 133/81 Accompanied By: alone Schedule Follow-up Appointment: Yes Clinical Summary of Care: Patient Declined Electronic Signature(s) Signed: 02/04/2021 5:51:35 PM By: [name] RN, BSN Entered By: [name] on 02/03/2021 08:56:53 -------------------------------------------------------------------------------- Lower Extremity Assessment Details Patient Name: Date of Service: [name], [name] 02/03/2021 7:30 A M Medical Record Number: [id number] Patient Account Number: [id number] Date of Birth/Sex: Treating RN: [birth year] (35 y.o. [name] Primary Care Maicie Vanderloop: [name] Other Clinician: Referring Kamri Gotsch: Treating Evadne Ose/Extender: [name] in Treatment: 66 Edema Assessment Assessed: [name]: No] [name]: No] Edema: [Left: Ye] [Right: s] Calf Left: Right: Point of Measurement: 48 cm From Medial Instep 52 cm Ankle Left: Right: Point of Measurement: 11 cm From Medial Instep 28 cm Vascular Assessment Pulses: Dorsalis Pedis Palpable: [Left:Yes] Electronic Signature(s) Signed: 02/04/2021 5:51:35 PM By: [name] RN, BSN Entered By: [name] on 02/03/2021 07:57:27 -------------------------------------------------------------------------------- Multi Wound Chart Details Patient Name: Date of Service: [name], [name] 02/03/2021 7:30 A M Medical Record Number: [id number] Patient Account Number: [id number] Date of Birth/Sex: Treating RN: 07-Jan-1986 (35 y.o. [name] Primary Care Layah Skousen: [name] Other Clinician: Referring Davidjames Blansett: Treating Nainoa Woldt/Extender: [name] in Treatment: 66 Vital Signs Height(in): 77 Capillary Blood  Glucose(mg/dl): 134 Weight(lbs): 280 Pulse(bpm): 90 Body Mass Index(BMI): 33 Blood Pressure(mmHg): 133/81 Temperature(F): 98.5 Respiratory Rate(breaths/min): 18 Photos: [N/A:N/A] Left, Lateral Foot N/A N/A Wound Location: Trauma N/A N/A Wounding Event: Diabetic Wound/Ulcer of the Lower N/A N/A Primary Etiology: Extremity Type II Diabetes N/A N/A Comorbid History: 10/02/2019 N/A N/A Date Acquired: 27 N/A N/A Weeks of Treatment: Open N/A N/A Wound Status: 3.6x2.7x0.5 N/A N/A Measurements L x W x D (cm) 7.634 N/A N/A A (cm) : rea 3.817 N/A N/A Volume (cm) : -362.90% N/A N/A % Reduction in A rea: -  2213.30% N/A N/A % Reduction in Volume: 2 Starting Position 1 (o'clock): 5 Ending Position 1 (o'clock): 0.2 Maximum Distance 1 (cm): Yes N/A N/A Undermining: Grade 2 N/A N/A Classification: Medium N/A N/A Exudate A mount: Serosanguineous N/A N/A Exudate Type: red, brown N/A N/A Exudate Color: Epibole N/A N/A Wound Margin: Large (67-100%) N/A N/A Granulation A mount: Red, Pink N/A N/A Granulation Quality: None Present (0%) N/A N/A Necrotic A mount: Fat Layer (Subcutaneous Tissue): Yes N/A N/A Exposed Structures: Fascia: No Tendon: No Muscle: No Joint: No Bone: No None N/A N/A Epithelialization: Debridement - Selective/Open Wound N/A N/A Debridement: Pre-procedure Verification/Time Out 08:12 N/A N/A Taken: Slough N/A N/A Tissue Debrided: Non-Viable Tissue N/A N/A Level: 9.72 N/A N/A Debridement A (sq cm): rea Curette N/A N/A Instrument: Moderate N/A N/A Bleeding: Pressure N/A N/A Hemostasis A chieved: 0 N/A N/A Procedural Pain: 0 N/A N/A Post Procedural Pain: Procedure was tolerated well N/A N/A Debridement Treatment Response: 3.6x2.7x0.5 N/A N/A Post Debridement Measurements L x W x D (cm) 3.817 N/A N/A Post Debridement Volume: (cm) Debridement N/A N/A Procedures Performed: T Contact Cast otal Treatment Notes Electronic  Signature(s) Signed: 02/04/2021 5:02:06 PM By: [name] MD Signed: 02/04/2021 5:51:35 PM By: [name] RN, BSN Entered By: [name] on 02/03/2021 08:31:48 -------------------------------------------------------------------------------- Multi-Disciplinary Care Plan Details Patient Name: Date of Service: [name], [name] 02/03/2021 7:30 A M Medical Record Number: [id number] Patient Account Number: [id number] Date of Birth/Sex: Treating RN: 09/14/1985 (35 y.o. [name] Primary Care Everlena Mackley: [name] Other Clinician: Referring Jezreel Justiniano: Treating Esker Dever/Extender: [name] in Treatment: 66 Multidisciplinary Care Plan reviewed with physician Active Inactive Nutrition Nursing Diagnoses: Imbalanced nutrition Potential for alteratiion in Nutrition/Potential for imbalanced nutrition Goals: Patient/caregiver agrees to and verbalizes understanding of need to use nutritional supplements and/or vitamins as prescribed Date Initiated: 10/24/2019 Date Inactivated: 04/06/2020 Target Resolution Date: 04/03/2020 Goal Status: Met Patient/caregiver will maintain therapeutic glucose control Date Initiated: 10/24/2019 Target Resolution Date: 03/02/2021 Goal Status: Active Interventions: Assess HgA1c results as ordered upon admission and as needed Assess patient nutrition upon admission and as needed per policy Provide education on elevated blood sugars and impact on wound healing Provide education on nutrition Treatment Activities: Education provided on Nutrition : 11/24/2020 Notes: 11/17/20: Glucose control ongoing issue, target date extended. 01/26/21: Glucose management continues. Wound/Skin Impairment Nursing Diagnoses: Impaired tissue integrity Knowledge deficit related to ulceration/compromised skin integrity Goals: Patient/caregiver will verbalize understanding of skin care regimen Date Initiated: 10/24/2019 Target Resolution  Date: 03/02/2021 Goal Status: Active Ulcer/skin breakdown will have a volume reduction of 30% by week 4 Date Initiated: 10/24/2019 Date Inactivated: 01/16/2020 Target Resolution Date: 01/10/2020 Unmet Reason: no change in Goal Status: Unmet measurements. Interventions: Assess patient/caregiver ability to obtain necessary supplies Assess patient/caregiver ability to perform ulcer/skin care regimen upon admission and as needed Assess ulceration(s) every visit Provide education on ulcer and skin care Notes: 11/17/20: Wound care regimen continues Electronic Signature(s) Signed: 02/04/2021 5:51:35 PM By: [name] RN, BSN Entered By: [name] on 02/03/2021 07:54:19 -------------------------------------------------------------------------------- Pain Assessment Details Patient Name: Date of Service: [name], [name] 02/03/2021 7:30 A M Medical Record Number: [id number] Patient Account Number: [id number] Date of Birth/Sex: Treating RN: 08-30-1985 (35 y.o. [name] Primary Care Jeorge Reister: [name] Other Clinician: Referring Staton Markey: Treating Zebbie Ace/Extender: [name] in Treatment: 66 Active Problems Location of Pain Severity and Description of Pain Patient Has Paino No Site Locations Pain Management and Medication Current Pain  Management: Electronic Signature(s) Signed: 02/04/2021 5:51:35 PM By: [name] RN, BSN Entered By: [name] on 02/03/2021 07:53:02 -------------------------------------------------------------------------------- Patient/Caregiver Education Details Patient Name: Date of Service: [name], [name] 11/2/2022andnbsp7:30 A M Medical Record Number: [id number] Patient Account Number: [id number] Date of Birth/Gender: Treating RN: 1985-04-30 (35 y.o. [name] Primary Care Physician: [name] Other Clinician: Referring Physician: Treating Physician/Extender: [name] in Treatment: 33 Education Assessment Education Provided To: Patient Education Topics Provided Wound/Skin Impairment: Methods: Explain/Verbal Responses: State content correctly [employer]) Signed: 02/04/2021 5:51:35 PM By: [name] RN, BSN Entered By: [name] on 02/03/2021 07:54:32 -------------------------------------------------------------------------------- Wound Assessment Details Patient Name: Date of Service: [name], [name] 02/03/2021 7:30 A M Medical Record Number: [id number] Patient Account Number: [id number] Date of Birth/Sex: Treating RN: 18-Aug-1985 (35 y.o. [name] Primary Care Shekina Cordell: [name] Other Clinician: Referring Zabrina Brotherton: Treating Mattingly Fountaine/Extender: [name] in Treatment: 66 Wound Status Wound Number: 3 Primary Etiology: Diabetic Wound/Ulcer of the Lower Extremity Wound Location: Left, Lateral Foot Wound Status: Open Wounding Event: Trauma Comorbid History: Type II Diabetes Date Acquired: 10/02/2019 Weeks Of Treatment: 66 Clustered Wound: No Photos Wound Measurements Length: (cm) 3.6 Width: (cm) 2.7 Depth: (cm) 0.5 Area: (cm) 7.634 Volume: (cm) 3.817 % Reduction in Area: -362.9% % Reduction in Volume: -2213.3% Epithelialization: None Tunneling: No Undermining: Yes Starting Position (o'clock): 2 Ending Position (o'clock): 5 Maximum Distance: (cm) 0.2 Wound Description Classification: Grade 2 Wound Margin: Epibole Exudate Amount: Medium Exudate Type: Serosanguineous Exudate Color: red, brown Foul Odor After Cleansing: No Slough/Fibrino No Wound Bed Granulation Amount: Large (67-100%) Exposed Structure Granulation Quality: Red, Pink Fascia Exposed: No Necrotic Amount: None Present (0%) Fat Layer (Subcutaneous Tissue) Exposed: Yes Tendon Exposed: No Muscle Exposed: No Joint Exposed: No Bone Exposed: No Treatment Notes Wound #3 (Foot) Wound  Laterality: Left, Lateral Cleanser Soap and Water Discharge Instruction: May shower and wash wound with dial antibacterial soap and water prior to dressing change. Wound Cleanser Discharge Instruction: Cleanse the wound with wound cleanser prior to applying a clean dressing using gauze sponges, not tissue or cotton balls. Peri-Wound Care Zinc Oxide Ointment 30g tube Discharge Instruction: Apply Zinc Oxide to periwound with each dressing change as needed. Topical keystone antibiotic compound Discharge Instruction: thin layer to wound bed Primary Dressing KerraCel Ag Gelling Fiber Dressing, 4x5 in (silver alginate) Discharge Instruction: Apply silver alginate to wound bed as instructed Secondary Dressing ABD Pad, 8x10 Discharge Instruction: Apply over primary dressing as directed. Zetuvit Plus 4x8 in Discharge Instruction: Apply over primary dressing as directed. Secured With Compression Wrap Kerlix Roll 4.5x3.1 (in/yd) Discharge Instruction: Apply Kerlix and Coban compression as directed. Coban Self-Adherent Wrap 4x5 (in/yd) Discharge Instruction: Apply over Kerlix as directed. Compression Stockings Add-Ons Electronic Signature(s) Signed: 02/04/2021 5:51:35 PM By: [name] RN, BSN Entered By: [name] on 02/03/2021 08:00:53 -------------------------------------------------------------------------------- [place] Details Patient Name: Date of Service: [name], [name] 02/03/2021 7:30 A M Medical Record Number: [id number] Patient Account Number: [id number] Date of Birth/Sex: Treating RN: 09-08-1985 (35 y.o. [name] Primary Care Darryon Bastin: [name] Other Clinician: Referring Raijon Lindfors: Treating Murray Guzzetta/Extender: [name] in Treatment: 66 Vital Signs Time Taken: 07:50 Temperature (F): 98.5 Height (in): 77 Pulse (bpm): 90 Weight (lbs): 280 Respiratory Rate (breaths/min): 18 Body Mass Index (BMI): 33.2 Blood Pressure  (mmHg): 133/81 Capillary Blood Glucose (mg/dl): 134 Reference Range: 80 - 120 mg / dl Notes glucose per pt report  Electronic Signature(s) Signed: 02/04/2021 5:51:35 PM By: [name] RN, BSN Entered By: [name] on 02/03/2021 07:50:53

## 2021-02-05 ENCOUNTER — Other Ambulatory Visit: Payer: Self-pay

## 2021-02-05 ENCOUNTER — Encounter (HOSPITAL_BASED_OUTPATIENT_CLINIC_OR_DEPARTMENT_OTHER): Payer: BC Managed Care – PPO | Admitting: Internal Medicine

## 2021-02-05 DIAGNOSIS — E11621 Type 2 diabetes mellitus with foot ulcer: Secondary | ICD-10-CM | POA: Diagnosis not present

## 2021-02-05 DIAGNOSIS — L97528 Non-pressure chronic ulcer of other part of left foot with other specified severity: Secondary | ICD-10-CM

## 2021-02-05 NOTE — Progress Notes (Signed)
Diggs, [name] ([id number]) Visit Report for 02/05/2021 Chief Complaint Document Details Patient Name: Date of Service: [name] D 02/05/2021 8:30 A M Medical Record Number: [id number] Patient Account Number: [id number] Date of Birth/Sex: Treating RN: [birth year] (35 y.o. [name] Primary Care Provider: [name] Other Clinician: Referring Provider: Treating Provider/Extender: [name] in Treatment: 90 Information Obtained from: Patient Chief Complaint 01/11/2019; patient is here for review of a rather substantial wound over the left fifth plantar metatarsal head extending into the lateral part of his foot 10/24/2019; patient returns to clinic with wounds on his bilateral feet with underlying osteomyelitis biopsy-proven Electronic Signature(s) Signed: 02/05/2021 10:24:39 AM By: [name] DO Entered By: [name] on 02/05/2021 09:34:59 -------------------------------------------------------------------------------- HPI Details Patient Name: Date of Service: [name], [name] 02/05/2021 8:30 A M Medical Record Number: [id number] Patient Account Number: [id number] Date of Birth/Sex: Treating RN: 03-18-1986 (35 y.o. 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 He comes in for review of a wound over the plantar fifth metatarsal head extending into the lateral part of the foot. He was followed for this previously by his podiatrist Dr. Cornelius Moras. As the patient tells his story he went to see podiatry first for a swelling he developed on the lateral part of his fifth metatarsal head in May. He states this was "open" by podiatry and the area closed. He was followed  up in June and it was again opened callus removed and it closed promptly. There were plans being made for surgery on the fifth metatarsal head in June however his blood sugar was apparently too high for anesthesia. Apparently the area was debrided and opened again in June and it is never closed since. Looking over the records from podiatry I am really not able to follow this. It was clear when he was first seen it was before 5/14 at that point he already had a wound. By 5/17 the ulcer was resolved. I do not see anything about a procedure. On 5/28 noted to have pre-ulcerative moderate keratosis. X-ray noted 1/5 contracted toe and tailor's bunion and metatarsal deformity. On a visit date on 09/28/2018 the dorsal part of the left foot it healed and resolved. There was concern about swelling in his lower extremity he was sent to the ER.. As far as I can tell he was seen in the ER on 7/12 with an ulcer on his left foot. A DVT rule out of the left leg was negative. I do not think I have complete records from podiatry but I am not able to verify the procedures this patient states he had. He states after the last procedure the wound has never closed although I am not able to follow this in the records I have from podiatry. He has not had a recent x-ray The patient has been using Neosporin on the wound. He is wearing a Darco shoe. He is still very active up on his foot working and exercising. Past medical history; type 2 diabetes ketosis-prone, leg swelling with a negative DVT study in July. Non-smoker ABI in our clinic was 0.85 on the left 10/16; substantial wound on the plantar left fifth met head extending laterally almost to the dorsal fifth MTP. We have been using silver alginate we  gave him a Darco forefoot off loader. An x-ray did not show evidence of osteomyelitis did note soft tissue emphysema which I think was due to gas tracking through an open wound. There is no doubt in my mind he requires an  MRI 10/23; MRI not booked until 3 November at the earliest this is largely due to his glucose sensor in the right arm. We have been using silver alginate. There has been an improvement 10/29; I am still not exactly sure when his MRI is booked for. He says it is the third but it is the 10th in epic. This definitely needs to be done. He is running a low-grade fever today but no other symptoms. No real improvement in the 1 02/26/2019 patient presents today for a follow-up visit here in our clinic he is last been seen in the clinic on October 29. Subsequently we were working on getting MRI to evaluate and see what exactly was going on and where we would need to go from the standpoint of whether or not he had osteomyelitis and again what treatments were going be required. Subsequently the patient ended up being admitted to the hospital on 02/07/2019 and was discharged on 02/14/2019. This is a somewhat interesting admission with a discharge diagnosis of pneumonia due to COVID-19 although he was positive for COVID-19 when tested at the urgent care but negative x2 when he was actually in the hospital. With that being said he did have acute respiratory failure with hypoxia and it was noted he also have a left foot ulceration with osteomyelitis. With that being said he did require oxygen for his pneumonia and I level 4 L. He was placed on antivirals and steroids for the COVID-19. He was also transferred to the [facility] at one point. Nonetheless he did subsequently discharged home and since being home has done much better in that regard. The CT angiogram did not show any pulmonary embolism. With regard to the osteomyelitis the patient was placed on vancomycin and Zosyn while in the hospital but has been changed to Augmentin at discharge. It was also recommended that he follow- up with wound care and podiatry. Podiatry however wanted him to see [name] according to the patient prior to them doing anything  further. His hemoglobin A1c was 9.9 as noted in the hospital. Have an MRI of the left foot performed while in the hospital on 02/04/2019. 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 There is an overlying plantar open wound noted an abscess tracking back along the lateral aspect of the fifth metatarsal shaft. There is otherwise diffuse cellulitis and mild fasciitis without findings of polymyositis. The patient did have recently pneumonia secondary to COVID-19 I looked in the chart through epic and it does appear that the patient may need to have an additional x-ray just to ensure everything is cleared and that he has no airspace disease prior to putting him into the [name]. 03/05/2019; patient was readmitted to the clinic last week. He was hospitalized twice for a viral upper respiratory tract infection from 11/1 through 11/4 and then 11/5 through 11/12 ultimately this turned out to be Covid pneumonitis. Although he was discharged on oxygen he is not using it. He says he feels fine. He has no exercise limitation no cough no sputum. His O2 sat in our clinic today was 100% on room air. He did manage to have his MRI which showed  septic arthritis at the fifth MTP joint and osteomyelitis involving the fifth metatarsal head and proximal phalanx. He received Vanco and Zosyn in the hospital and then was discharged on 2 weeks of Augmentin. I do not see any relevant cultures. He was supposed to follow-up with infectious disease but I do not see that he has an appointment. 12/8; patient saw Dr. Novella Olive of infectious disease last week. He felt that he had had adequate antibiotic therapy. He did not go to follow-up with Dr. Amalia Hailey of podiatry and I have again talked to him about the pros and cons of this. He does not want to consider a ray amputation of this time. He is aware of the risks of recurrence, migration etc. He  started HBO today and tolerated this well. He can complete the Augmentin that I gave him last week. I have looked over the lab work that Dr. Chana Bode ordered his C-reactive protein was 3.3 and his sedimentation rate was 17. The C-reactive protein is never really been measurably that high in this patient 12/15; not much change in the wound today however he has undermining along the lateral part of the foot again more extensively than last week. He has some rims of epithelialization. We have been using silver alginate. He is undergoing hyperbarics but did not dive today 12/18; in for his obligatory first total contact cast change. Unfortunately there was pus coming from the undermining area around his fifth metatarsal head. This was cultured but will preclude reapplication of a cast. He is seen in conjunction with HBO 12/24; patient had staph lugdunensis in the wound in the undermining area laterally last time. We put him on doxycycline which should have covered this. The wound looks better today. I am going to give him another week of doxycycline before reattempting the total contact cast 12/31; the patient is completing antibiotics. Hemorrhagic debris in the distal part of the wound with some undermining distally. He also had hyper granulation. Extensive debridement with a #5 curette. The infected area that was on the lateral part of the fifth met head is closed over. I do not think he needs any more antibiotics. Patient was seen prior to HBO. Preparations for a total contact cast were made in the cast will be placed post hyperbarics 04/11/19; once again the patient arrives today without complaint. He had been in a cast all week noted that he had heavy drainage this week. This resulted in large raised areas of macerated tissue around the wound 1/14; wound bed looks better slightly smaller. Hydrofera Blue has been changing himself. He had a heavy drainage last week which caused a lot of maceration around  the wound so I took him out of a total contact cast he says the drainage is actually better this week He is seen today in conjunction with HBO 1/21; returns to clinic. He was up in [place] for a day or 2 attending a funeral. He comes back in with the wound larger and with a large area of exposed bone. He had osteomyelitis and septic arthritis of the fifth left metatarsal head while he was in hospital. He received IV antibiotics in the hospital for a prolonged period of time then 3 weeks of Augmentin. Subsequently I gave him 2 weeks of doxycycline for more superficial wound infection. When I saw this last week the wound was smaller the surface of the wound looks satisfactory. 1/28; patient missed hyperbarics today. Bone biopsy I did last time showed Enterococcus faecalis and Staphylococcus lugdunensis .  He has a wide area of exposed bone. We are going to use silver alginate as of today. I had another ethical discussion with the patient. 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 In this situation I think the likelihood of healing this is low. Therefore I have recommended a ray amputation and with the patient's agreement I have referred him to Dr. Doran Durand. The other issue is that his compliance with hyperbarics has been minimal because of his work schedule and given his underlying decision I am going to stop this today READMISSION 10/24/2019 MRI 09/29/2019 left foot IMPRESSION: 1. Apparent skin ulceration inferior and lateral to the 5th metatarsal base with underlying heterogeneous T2 signal and enhancement in the subcutaneous fat. Small peripherally enhancing fluid collections along the plantar and lateral aspects of the 5th metatarsal base suspicious for abscesses. 2. Interval amputation through the mid 5th metatarsal with nonspecific low-level marrow edema and enhancement. Given the proximity to the adjacent soft tissue inflammatory changes, osteomyelitis  cannot be excluded. 3. The additional bones appear unremarkable. MRI 09/29/2019 right foot IMPRESSION: 1. Soft tissue ulceration lateral to the 5th MTP joint. There is low-level T2 hyperintensity within the 4th and 5th metatarsal heads and adjacent proximal phalanges without abnormal T1 signal or cortical destruction. These findings are nonspecific and could be seen with early marrow edema, hyperemia or early osteomyelitis. No evidence of septic joint. 2. Mild tenosynovitis and synovial enhancement associated with the extensor digitorum tendons at the level of the midfoot. 3. Diffuse low-level muscular T2 hyperintensity and enhancement, most consistent with diabetic myopathy. LEFT FOOT BONE Methicillin resistant staphylococcus aureus Staphylococcus lugdunensis MIC MIC CIPROFLOXACIN >=8 RESISTANT Resistant <=0.5 SENSI... Sensitive CLINDAMYCIN <=0.25 SENS... Sensitive >=8 RESISTANT Resistant ERYTHROMYCIN >=8 RESISTANT Resistant >=8 RESISTANT Resistant GENTAMICIN <=0.5 SENSI... Sensitive <=0.5 SENSI... Sensitive Inducible Clindamycin NEGATIVE Sensitive NEGATIVE Sensitive OXACILLIN >=4 RESISTANT Resistant 2 SENSITIVE Sensitive RIFAMPIN <=0.5 SENSI... Sensitive <=0.5 SENSI... Sensitive TETRACYCLINE <=1 SENSITIVE Sensitive <=1 SENSITIVE Sensitive TRIMETH/SULFA <=10 SENSIT Sensitive <=10 SENSIT Sensitive ... Marland Kitchen.. VANCOMYCIN 1 SENSITIVE Sensitive <=0.5 SENSI... Sensitive Right foot bone . Component 3 wk ago Specimen Description BONE Special Requests RIGHT 4 METATARSAL SAMPLE B Gram Stain NO WBC SEEN NO ORGANISMS SEEN Culture RARE METHICILLIN RESISTANT STAPHYLOCOCCUS AUREUS NO ANAEROBES ISOLATED Performed at [facility], [place] [address], [place], [place] [address] Report Status 10/08/2019 FINAL Organism ID, Bacteria METHICILLIN RESISTANT STAPHYLOCOCCUS AUREUS Resulting Agency [facility] Susceptibility Methicillin resistant staphylococcus aureus MIC CIPROFLOXACIN >=8 RESISTANT  Resistant CLINDAMYCIN <=0.25 SENS... Sensitive ERYTHROMYCIN >=8 RESISTANT Resistant GENTAMICIN <=0.5 SENSI... Sensitive Inducible Clindamycin NEGATIVE Sensitive OXACILLIN >=4 RESISTANT Resistant RIFAMPIN <=0.5 SENSI... Sensitive TETRACYCLINE <=1 SENSITIVE Sensitive TRIMETH/SULFA <=10 SENSIT Sensitive ... VANCOMYCIN 1 SENSITIVE Sensitive This is a patient we had in clinic earlier this year with a wound over his left fifth metatarsal head. 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 Doran Durand on 05/16/2019. He seems to have developed problems with wounds on his bilateral feet in June 2021 although he may have had problems earlier than this. He was in an urgent care with a right foot ulcer on 09/26/2019 and given a course of doxycycline. This was apparently after having trouble getting into see orthopedics. He was seen by podiatry on 09/28/2019  noted to have bilateral lower extremity ulcers including the left lateral fifth metatarsal base and the right subfifth met head. It was noted that had purulent drainage at that time. He required hospitalization from 6/20 through 7/2. This was because of worsening right foot wounds. He underwent bilateral operative incision and drainage and bone biopsies bilaterally. Culture results are listed above. He has been referred back to clinic by Dr. Jacqualyn Posey of podiatry. He is also followed by Dr. Megan Salon who saw him yesterday. He was discharged from hospital on Zyvox Flagyl and Levaquin and yesterday changed to doxycycline Flagyl and Levaquin. His inflammatory markers on 6/26 showed a sedimentation rate of 129 and a C-reactive protein of 5. This is improved to 14 and 1.3 respectively. This  would indicate improvement. ABIs in our clinic today were 1.23 on the right and 1.20 on the left 11/01/2019 on evaluation today patient appears to be doing fairly well in regard to the wounds on his feet at this point. Fortunately there is no signs of active infection at this time. No fevers, chills, nausea, vomiting, or diarrhea. He currently is seeing infectious disease and still under their care at this point. Subsequently he also has both wounds which she has not been using collagen on as he did not receive that in his packaging he did not call us and let us know that. Apparently that just was missed on the order. Nonetheless we will get that straightened out today. 8/9-Patient returns for bilateral foot wounds, using Prisma with hydrogel moistened dressings, and the wounds appear stable. Patient using surgical shoes, avoiding much pressure or weightbearing as much as possible 8/16; patient has bilateral foot wounds. 1 on the right lateral foot proximally the other is on the left mid lateral foot. Both required debridement of callus and thick skin around the wounds. We have been using silver collagen 8/27; patient has bilateral lateral foot wounds. The area on the left substantially surrounded by callus and dry skin. This was removed from the wound edge. The underlying wound is small. The area on the right measured somewhat smaller today. 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 Unfortunately I did not update his antibiotics during today's visit. 9/10 I reviewed Dr. Hale Bogus last notes he felt he had completed antibiotics his inflammatory markers were reasonably well controlled. He has a small wound on the lateral left foot and a tiny area on the right which is just above closed. He is using Hydrofera Blue with border foam he has bilateral surgical shoes 9/24; 2 week f/u. doing well. right foot is closed. left foot still  undermined. 10/14; right foot remains closed at the fifth met head. The area over the base of the left fifth metatarsal has a small open area but considerable undermining towards the plantar foot. Thick callus skin around this suggests an adequate pressure relief. We have talked about this. He says he is going to go back into his cam boot. I suggested a total contact cast he did not seem enamored with this suggestion 10/26; left foot base of the fifth metatarsal. Same condition as last time. He has skin over the area with an open wound however the skin is not adherent. He went to see Dr. Earleen Newport who did an x-ray and culture of his foot I have not reviewed the x-ray but the patient was not told anything. He is on doxycycline 11/11; since the patient was last here he was in  the emergency room on 10/30 he was concerned about swelling in the left foot. They did not do any cultures or x-rays. They changed his antibiotics to cephalexin. Previous culture showed group B strep. The cephalexin is appropriate as doxycycline has less than predictable coverage. Arrives in clinic today with swelling over this area under the wound. He also has a new wound on the right fifth metatarsal head 11/18; the patient has a difficult wound on the lateral aspect of the left fifth metatarsal head. The wound was almost ballotable last week I opened it slightly expecting to see purulence however there was just bleeding. I cultured this this was negative. X-ray unchanged. We are trying to get an MRI but I am not sure were going to be able to get this through his insurance. He also has an area on the right lateral fifth metatarsal head this looks healthier 12/3; the patient finally got our MRI. Surprisingly this did not show osteomyelitis. I did show the soft tissue ulceration at the lateral plantar aspect of the fifth metatarsal base with a tiny residual 6 mm abscess overlying the superficial fascia I have tried to culture this area  I have not been able to get this to grow anything. Nevertheless the protruding tissue looks aggravated. I suspect we should try to treat the underlying "abscess with broad-spectrum antibiotics. I am going to start him on Levaquin and Flagyl. He has much less edema in his legs and I am going to continue to wrap his legs and see him weekly 12/10. I started Levaquin and Flagyl on him last week. He just picked up the Flagyl apparently there was some delay. The worry is the wound on the left fifth metatarsal base which is substantial and worsening. His foot looks like he inverts at the ankle making this a weightbearing surface. Certainly no improvement in fact I think the measurements of this are somewhat worse. We have been using 12/17; he apparently just got the Levaquin yesterday this is 2 weeks after the fact. He has completed the Flagyl. The area over the left fifth metatarsal base still has protruding granulation tissue although it does not look quite as bad as it did some weeks ago. He has severe bilateral lymphedema although we have not been treating him for wounds on his legs this is definitely going to require compression. There was so much edema in the left I did not wish to put him in a total contact cast today. I am going to increase his compression from 3-4 layer. The area on the right lateral fifth met head actually look quite good and superficial. 12/23; patient arrived with callus on the right fifth met head and the substantial hyper granulated callused wound on the base of his fifth metatarsal. He says he is completing his Levaquin in 2 days but I do not think that adds up with what I gave him but I will have to double check this. We are using Hydrofera Blue on both areas. My plan is to put the left leg in a cast the week after New Year's 04/06/2020; patient's wounds about the same. Right lateral fifth metatarsal head and left lateral foot over the base of the fifth metatarsal. There is  undermining on the left lateral foot which I removed before application of total contact cast continuing with Hydrofera Blue new. Patient tells me he was seen by endocrinology today lab work was done [Dr. Kerr]. Also wondering whether he was referred to cardiology. I went over  some lab work from previously does not have chronic renal failure certainly not nephrotic range proteinuria he does have very poorly controlled diabetes but this is not his most updated lab work. Hemoglobin A1c has been over 11 1/10; the patient had a considerable amount of leakage towards mid part of his left foot with macerated skin however the wound surface looks better the area on the right lateral fifth met head is better as well. I am going to change the dressing on the left foot under the total contact cast to silver alginate, continue with Hydrofera Blue on the right. 1/20; patient was in the total contact cast for 10 days. Considerable amount of drainage although the skin around the wound does not look too bad on the left foot. The area on the right fifth metatarsal head is closed. Our nursing staff reports large amount of drainage out of the left lateral foot wound 1/25; continues with copious amounts of drainage described by our intake staff. PCR culture I did last week showed E. coli and Enterococcus faecalis and low quantities. Multiple resistance genes documented including extended spectrum beta lactamase, MRSA, MRSE, quinolone, tetracycline. The wound is not quite as good this week as it was 5 days ago but about the same size 2/3; continues with copious amounts of malodorous drainage per our intake nurse. The PCR culture I did 2 weeks ago showed E. coli and low quantities of Enterococcus. There were multiple resistance genes detected. I put Neosporin on him last week although this does not seem to have helped. The wound is slightly deeper today. Offloading continues to be an issue here although with the amount of  drainage she has a total contact cast is just not going to work 2/10; moderate amount of drainage. Patient reports he cannot get his stocking on over the dressing. I told him we have to do that the nurse gave him suggestions on how to make this work. The wound is on the bottom and lateral part of his left foot. Is cultured predominantly grew low amounts of Enterococcus, E. coli and anaerobes. There were multiple resistance genes detected including extended spectrum beta lactamase, quinolone, tetracycline. I could not think of an easy oral combination to address this so for now I am going to do topical antibiotics provided by [facility] I think the main agents here are vancomycin and an aminoglycoside. We have to be able to give him access to the wounds to get the topical antibiotic on 2/17; moderate amount of drainage this is unchanged. He has his Keystone topical antibiotic against the deep tissue culture organisms. He has been using this and changing the dressing daily. Silver alginate on the wound surface. 2/24; using Keystone antibiotic with silver alginate on the top. He had too much drainage for a total contact cast at one point although I think that is improving and I think in the next week or 2 it might be possible to replace a total contact cast I did not do this today. In general the wound surface looks healthy however he continues to have thick rims of skin and subcutaneous tissue around the wide area of the circumference which I debrided 06/04/2020 upon evaluation today patient appears to be doing well in regard to his wound. I do feel like he is showing signs of improvement. There is little bit of callus and dead tissue around the edges of the wound as well as what appears to be a little bit of a sinus tract that is  off to the side laterally I would perform debridement to clear that away today. 3/17; left lateral foot. The wound looks about the same as I remember. Not much depth surface looks  healthy. No evidence of infection 3/25; left lateral foot. Wound surface looks about the same. Separating epithelium from the circumference. There really is no evidence of infection here however not making progress by my view 3/29; left lateral foot. Surface of the wound again looks reasonably healthy still thick skin and subcutaneous tissue around the wound margins. There is no evidence of infection. One of the concerns being brought up by the nurses has again the amount of drainage vis--vis continued use of a total contact cast 4/5; left lateral foot at roughly the base of the fifth metatarsal. Nice healthy looking granulated tissue with rims of epithelialization. The overall wound measurements are not any better but the tissue looks healthy. The only concern is the amount of drainage although he has no surrounding maceration with what we have been doing recently to absorb fluid and protect his skin. He also has lymphedema. 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 He states that the kerlix/coban under the TCC rolled down and could not get it back up. He has been using an offloading scooter and has somehow hurt his right foot using this device. This happened last week. He states that the side of his right foot developed a blister and opened. The top of his foot also has a few small open wounds he thinks is due to his socks rubbing in his shoes. He has not been using any dressings to the wound. He denies purulent drainage, fever/chills or erythema to the wounds. 4/22; patient presents for 1 week follow-up. He developed new wounds to the right foot that were evaluated at last clinic visit. He continues to have a total contact cast to the left leg and he  reports no issues. He has been using silver collagen to the right foot wounds with no issues. He denies purulent drainage, fever/chills or erythema to the right foot wounds. He has no complaints today 4/25; patient presents for 1 week follow-up. He has a total contact cast of the left leg and reports no issues. He has been using silver alginate to the right foot wound. He denies purulent drainage, fever/chills or erythema to the right foot wounds. 5/2 patient presents for 1 week follow-up. T contact cast on the left. The wound which is on the base of the plantar foot at the base of the fifth metatarsal otal actually looks quite good and dimensions continue to gradually contract. HOWEVER the area on the right lateral fifth metatarsal head is much larger than what I remember from 2 weeks ago. Once more is he has significant levels of hypergranulation. Noteworthy that he had this same hyper granulated response on his wound on the left foot at one point in time. So much so that he I thought there was an underlying fluid collection. Based on this I think this just needs debridement. 5/9; the wound on the left actually continues to be gradually smaller with a healthy surface. Slight amount of drainage and maceration of the skin around but not too bad. However he has a large wound  over the right fifth metatarsal head very much in the same configuration as his left foot wound was initially. I used silver nitrate to address the hyper granulated tissue no mechanical debridement 5/16; area on the left foot did not look as healthy this week deeper thick surrounding macerated skin and subcutaneous tissue. The area on the right foot fifth met head was about the same The area on the right ankle that we identified last week is completely broken down into an open wound presumably a stocking rubbing issue 5/23; patient has been using a total contact cast to the left side. He has been using silver alginate underneath.  He has also been using silver alginate to the right foot wounds. He has no complaints today. He denies any signs of infection. 5/31; the left-sided wound looks some better measure smaller surface granulation looks better. We have been using silver alginate under the total contact cast The large area on his right fifth met head and right dorsal foot look about the same still using silver alginate 6/6; neither side is good as I was hoping although the surface area dimensions are better. A lot of maceration on his left and right foot around the wound edge. Area on the dorsal right foot looks better. He says he was traveling. I am not sure what does the amount of maceration around the plantar wounds may be drainage issues 6/13; in general the wound surfaces look quite good on both sides. Macerated skin and raised edges around the wound required debridement although in general especially on the left the surface area seems improved. The area on the right dorsal ankle is about the same I thought this would not be such a problem to close 6/20; not much change in either wound although the one on the right looks a little better. Both wounds have thick macerated edges to the skin requiring debridements. We have been using silver alginate. The area on the dorsal right ankle is still open I thought this would be closed. 6/28; patient comes in today with a marked deterioration in the right foot wound fifth met head. Wide area of exposed bone this is a drastic change from last time. The area on the left there we have been casting is stagnant. We have been using silver alginate in both wound areas. 7/5; bone culture I did for PCR last time was positive for Pseudomonas, group B strep, Enterococcus and Staph aureus. There was no suggestion of methicillin resistance or ampicillin resistant genes. This was resistant to tetracycline however He comes into the clinic today with the area over his right plantar fifth  metatarsal head which had been doing so well 2 weeks ago completely necrotic feeling bone. I do not know that this is going to be salvageable. The left foot wound is certainly no smaller but it has a better surface and is superficial. 7/8; patient called in this morning to say that his total contact cast was rubbing against his foot. He states he is doing fine overall. He denies signs of infection. 7/12; continued deterioration in the wound over the right fifth metatarsal head crumbling bone. This is not going to be salvageable. The patient agrees and wants to be referred to Dr. Doran Durand which we will attempt to arrange as soon as possible. I am going to continue him on antibiotics as long as that takes so I will renew those today. The area on the left foot which is the base of the fifth metatarsal continues to  look somewhat better. Healthy looking tissue no depth no debridement is necessary here. 7/20; the patient was kindly seen by Dr. Doran Durand of orthopedics on 10/19/2020. 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 Towards this end we have taken him out of the total contact cast on the left we will put him in a wrap with Hydrofera Blue. As I understand things surgery is planned for 7/21 7/27; patient had his surgery last Thursday. 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 Apparently everything went well we did not still disturb that today The area on the left foot actually looks quite good. He has been much less mobile which probably explains this he did not seem to do well in the total contact cast secondary to drainage and maceration I think. 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 Fortunately there is no evidence of active infection at this time. No fevers, chills, nausea, vomiting, or diarrhea.  Overall I think that he is actually doing extremely well. Nonetheless I do believe that he is staying off of this more following the surgery in his right foot that is the reason the left is doing so great. 8/16; left plantar foot wound. This looks smaller than the last time I saw this he is using Hydrofera Blue. The surgical wound on the right foot is being followed by Dr. Doran Durand we did not look at this today. He has surgical shoes on both feet 8/23; left plantar foot wound not as good this week. Surrounding macerated skin and subcutaneous tissue everything looks moist and wet. I do not think he is offloading this adequately. He is using a surgical shoe Apparently the right foot surgical wound is not open although I did not check his foot 8/31; left plantar foot lateral aspect. Much improved this week. He has no maceration. Some improvement in the surface area of the wound but most impressively the depth is come in we are using silver alginate. The patient is a Product/process development scientist. He is asked that we write him a letter so he can go back to work. I have also tried to see if we can write something that will allow him to limit the amount of time that he is on his foot at work. 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 Hopefully the latter can be avoided 9/6; I believe the patient missed an appointment last week. He arrives in today with a wound looking roughly the same certainly no better. Undermining laterally and also inferiorly. We used molecuLight today in training with the patient's permission.. We are using silver alginate 9/21 wound is measuring bigger this week although this may have to do with the aggressive circumferential debridement last week in response to the blush fluorescence on the MolecuLight. Culture I did last week showed significant MSSA and E. coli. I put him on Augmentin but he has not started it  yet. We are also going to send this for compounded antibiotics at [facility] There is no evidence of systemic infection 9/29; silver alginate. His Keystone arrived. He is completing Augmentin in 2 days. Offloading in a cam boot. Moderate drainage per our intake staff 10/5; using silver alginate. He has  been using his [place]. He has completed his Augmentin. Per our intake nurse still a lot of drainage, far too much to consider a total contact cast. Wound measures about the same. He had the same undermining area that I defined last week from a roughly 11-3. I remove this today 10/12; using silver alginate he is using the [place]. He comes in for a nurse visit hence we are applying [employer] twice a week. Measuring slightly better today and less notable drainage. Extensive debridement of the wound edge last time 10/18; using topical Keystone and silver alginate and a soft cast. Wound measurements about the same. Drainage was through his soft cast. We are changing this twice a week Tuesdays and Friday 10/25; comes in with moderate drainage. Still using Keystone silver alginate and a soft cast. Wound dimensions completely the same.He has a lot of edema in the left leg he has lymphedema. Asking for [name] to consider wrapping him as he cannot get his stocking on over the soft cast 11/2; comes in with moderate to large drainage slightly smaller in terms of width we have been using [place]. His wound looks satisfactory but not much improvement 11/4; patient presents today for ovulatory cast change. Has no issues or complaints today. He denies signs of infection. Electronic Signature(s) Signed: 02/05/2021 10:24:39 AM By: [name] DO Entered By: [name] on 02/05/2021 09:37:35 -------------------------------------------------------------------------------- Physical Exam Details Patient Name: Date of Service: [name], [name] 02/05/2021 8:30 A M Medical Record Number: [id number] Patient  Account Number: [id number] Date of Birth/Sex: Treating RN: 25-Jun-1985 (35 y.o. [name] Primary Care Provider: [name] Other Clinician: Referring Provider: Treating Provider/Extender: [name] in Treatment: 21 Constitutional respirations regular, non-labored and within target range for patient.Marland Kitchen Psychiatric pleasant and cooperative. Notes Left plantar foot wound with granulation tissue and circumferential undermining. No evidence of surrounding infection. Electronic Signature(s) Signed: 02/05/2021 10:24:39 AM By: [name] DO Entered By: [name] on 02/05/2021 09:40:32 -------------------------------------------------------------------------------- Physician Orders Details Patient Name: Date of Service: [name], [name] 02/05/2021 8:30 A M Medical Record Number: [id number] Patient Account Number: [id number] Date of Birth/Sex: Treating RN: February 12, 1986 (35 y.o. [name] Primary Care Provider: [name] Other Clinician: Referring Provider: Treating Provider/Extender: [name] in Treatment: 38 Verbal / Phone Orders: No Diagnosis Coding ICD-10 Coding Code Description E11.621 Type 2 diabetes mellitus with foot ulcer L97.528 Non-pressure chronic ulcer of other part of left foot with other specified severity Follow-up Appointments ppointment in 1 week. - with Dr. Dellia Nims Return A Other: - MD visit Friday 11/4 for cast change Edema Control - Lymphedema / SCD / Other Bilateral Lower Extremities Elevate legs to the level of the heart or above for 30 minutes daily and/or when sitting, a frequency of: - throughout the day Avoid standing for long periods of time. Exercise regularly Moisturize legs daily. - right leg every night before bed. Compression stocking or Garment 20-30 mm/Hg pressure to: - Apply to right leg in the morning and remove at night. Off-Loading Total Contact Cast to  Left Lower Extremity Open toe surgical shoe to: - Left Foot with soft cast Other: - minimal weight bearing left foot Additional Orders / Instructions Follow Nutritious Diet Wound Treatment Wound #3 - Foot Wound Laterality: Left, Lateral Cleanser: Soap and Water 2 x Per Week/30 Days Discharge Instructions: May shower and wash wound with dial antibacterial soap and water prior to dressing change. Cleanser: Wound Cleanser (Generic) 2 x  Per Week/30 Days Discharge Instructions: Cleanse the wound with wound cleanser prior to applying a clean dressing using gauze sponges, not tissue or cotton balls. Peri-Wound Care: Zinc Oxide Ointment 30g tube 2 x Per Week/30 Days Discharge Instructions: Apply Zinc Oxide to periwound with each dressing change as needed. Topical: keystone antibiotic compound 2 x Per Week/30 Days Discharge Instructions: thin layer to wound bed Prim Dressing: KerraCel Ag Gelling Fiber Dressing, 4x5 in (silver alginate) (Generic) 2 x Per Week/30 Days ary Discharge Instructions: Apply silver alginate to wound bed as instructed Secondary Dressing: ABD Pad, 8x10 2 x Per Week/30 Days Discharge Instructions: Apply over primary dressing as directed. Secondary Dressing: Zetuvit Plus 4x8 in (Generic) 2 x Per Week/30 Days Discharge Instructions: Apply over primary dressing as directed. Compression Wrap: Kerlix Roll 4.5x3.1 (in/yd) 2 x Per Week/30 Days Discharge Instructions: Apply Kerlix and Coban compression as directed. Compression Wrap: Coban Self-Adherent Wrap 4x5 (in/yd) 2 x Per Week/30 Days Discharge Instructions: Apply over Kerlix as directed. Electronic Signature(s) Signed: 02/05/2021 10:24:39 AM By: [name] DO Entered By: [name] on 02/05/2021 09:40:45 -------------------------------------------------------------------------------- Problem List Details Patient Name: Date of Service: [name], [name] 02/05/2021 8:30 A M Medical Record Number:  [id number] Patient Account Number: [id number] Date of Birth/Sex: Treating RN: 01-19-1986 (35 y.o. [name] Primary Care Provider: [name] Other Clinician: Referring Provider: Treating Provider/Extender: [name] in Treatment: 55 Active Problems ICD-10 Encounter Code Description Active Date MDM Diagnosis E11.621 Type 2 diabetes mellitus with foot ulcer 10/24/2019 No Yes L97.528 Non-pressure chronic ulcer of other part of left foot with other specified 10/24/2019 No Yes severity Inactive Problems ICD-10 Code Description Active Date Inactive Date L97.518 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ity 10/24/2019 10/24/2019 L97.518 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ity 07/14/2020 07/14/2020 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, right ankle and foot 10/24/2019 10/24/2019 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 08/10/2020 08/10/2020 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, left ankle and foot 10/24/2019 10/24/2019 B95.62 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 10/24/2019 10/24/2019 classified elsewhere Resolved Problems Electronic Signature(s) Signed: 02/05/2021 10:24:39 AM By: [name] DO Entered By: [name] on 02/05/2021 09:34:44 -------------------------------------------------------------------------------- Progress Note Details Patient Name: Date of Service: [name], [name] 02/05/2021 8:30 A M Medical Record Number: [id number] Patient Account Number: [id number] Date of Birth/Sex: Treating RN: 18-Nov-1985 (35 y.o. 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 He comes in for review of a wound over the plantar fifth metatarsal head extending into the lateral part of the foot. He was followed for this previously by his podiatrist Dr. Cornelius Moras. As the patient tells his story he went to see podiatry first for a swelling he developed on the lateral part of his fifth metatarsal head in May. He states this was "open" by podiatry and the area closed. He was followed up in June and it was  again opened callus removed and it closed promptly. There were plans being made for surgery on the fifth metatarsal head in June however his blood sugar was apparently too high for anesthesia. Apparently the area was debrided and opened again in June and it is never closed since. Looking over the records from podiatry I am really not able to follow this. It was clear when he was first seen it was before 5/14 at that point he already had a wound. By 5/17 the ulcer was resolved. I do not see anything about a procedure. On 5/28 noted to have pre-ulcerative moderate keratosis. X-ray noted 1/5 contracted toe and tailor's bunion and metatarsal deformity. On a visit date on 09/28/2018 the dorsal part of the left foot it healed and resolved. There was concern about swelling in his lower extremity he was sent to the ER.. As far as I can tell he was seen in the ER on 7/12 with an ulcer on his left foot. A DVT rule out of the left leg was negative. I do not think I have complete records from podiatry but I am not able to verify the procedures this patient states he had. He states after the last procedure  the wound has never closed although I am not able to follow this in the records I have from podiatry. He has not had a recent x-ray The patient has been using Neosporin on the wound. He is wearing a Darco shoe. He is still very active up on his foot working and exercising. Past medical history; type 2 diabetes ketosis-prone, leg swelling with a negative DVT study in July. Non-smoker ABI in our clinic was 0.85 on the left 10/16; substantial wound on the plantar left fifth met head extending laterally almost to the dorsal fifth MTP. We have been using silver alginate we gave him a Darco forefoot off loader. An x-ray did not show evidence of osteomyelitis did note soft tissue emphysema which I think was due to gas tracking through an open wound. There is no doubt in my mind he requires an MRI 10/23; MRI not booked until 3 November at the earliest this is largely due to his glucose sensor in the right arm. We have been using silver alginate. There has been an improvement 10/29; I am still not exactly sure when his MRI is booked for. He says it is the third but it is the 10th in epic. This definitely needs to be done. He is running a low-grade fever today but no other symptoms. No real improvement in the 1 02/26/2019 patient presents today for a follow-up visit here in our clinic he is last been seen in the clinic on October 29. Subsequently we were working on getting MRI to evaluate and see what exactly was going on and where we would need to go from the standpoint of whether or not he had osteomyelitis and again what treatments were going be required. Subsequently the patient ended up being admitted to the hospital on 02/07/2019 and was discharged on 02/14/2019. This is a somewhat interesting admission with a discharge diagnosis of pneumonia due to COVID-19 although he was positive for COVID-19 when tested at the urgent care but negative x2 when he was actually in the hospital. With that being said he  did have acute respiratory failure with hypoxia and it was noted he also have a left foot ulceration with osteomyelitis. With that being said he did require oxygen for his pneumonia and I level 4 L. He  was placed on antivirals and steroids for the COVID-19. He was also transferred to the [facility] at one point. Nonetheless he did subsequently discharged home and since being home has done much better in that regard. The CT angiogram did not show any pulmonary embolism. With regard to the osteomyelitis the patient was placed on vancomycin and Zosyn while in the hospital but has been changed to Augmentin at discharge. It was also recommended that he follow- up with wound care and podiatry. Podiatry however wanted him to see [name] according to the patient prior to them doing anything further. His hemoglobin A1c was 9.9 as noted in the hospital. Have an MRI of the left foot performed while in the hospital on 02/04/2019. 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 There is an overlying plantar open wound noted an abscess tracking back along the lateral aspect of the fifth metatarsal shaft. There is otherwise diffuse cellulitis and mild fasciitis without findings of polymyositis. The patient did have recently pneumonia secondary to COVID-19 I looked in the chart through epic and it does appear that the patient may need to have an additional x-ray just to ensure everything is cleared and that he has no airspace disease prior to putting him into the [name]. 03/05/2019; patient was readmitted to the clinic last week. He was hospitalized twice for a viral upper respiratory tract infection from 11/1 through 11/4 and then 11/5 through 11/12 ultimately this turned out to be Covid pneumonitis. Although he was discharged on oxygen he is not using it. He says he feels fine. He has no exercise limitation no cough no  sputum. His O2 sat in our clinic today was 100% on room air. 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 He received Vanco and Zosyn in the hospital and then was discharged on 2 weeks of Augmentin. I do not see any relevant cultures. He was supposed to follow-up with infectious disease but I do not see that he has an appointment. 12/8; patient saw Dr. Novella Olive of infectious disease last week. He felt that he had had adequate antibiotic therapy. He did not go to follow-up with Dr. Amalia Hailey of podiatry and I have again talked to him about the pros and cons of this. He does not want to consider a ray amputation of this time. He is aware of the risks of recurrence, migration etc. He started HBO today and tolerated this well. He can complete the Augmentin that I gave him last week. I have looked over the lab work that Dr. Chana Bode ordered his C-reactive protein was 3.3 and his sedimentation rate was 17. The C-reactive protein is never really been measurably that high in this patient 12/15; not much change in the wound today however he has undermining along the lateral part of the foot again more extensively than last week. He has some rims of epithelialization. We have been using silver alginate. He is undergoing hyperbarics but did not dive today 12/18; in for his obligatory first total contact cast change. Unfortunately there was pus coming from the undermining area around his fifth metatarsal head. This was cultured but will preclude reapplication of a cast. He is seen in conjunction with HBO 12/24; patient had staph lugdunensis in the wound in the undermining area laterally last time. We put him on doxycycline  which should have covered this. The wound looks better today. I am going to give him another week of doxycycline before reattempting the total contact cast 12/31; the patient is completing  antibiotics. Hemorrhagic debris in the distal part of the wound with some undermining distally. He also had hyper granulation. Extensive debridement with a #5 curette. The infected area that was on the lateral part of the fifth met head is closed over. I do not think he needs any more antibiotics. Patient was seen prior to HBO. Preparations for a total contact cast were made in the cast will be placed post hyperbarics 04/11/19; once again the patient arrives today without complaint. He had been in a cast all week noted that he had heavy drainage this week. This resulted in large raised areas of macerated tissue around the wound 1/14; wound bed looks better slightly smaller. Hydrofera Blue has been changing himself. He had a heavy drainage last week which caused a lot of maceration around the wound so I took him out of a total contact cast he says the drainage is actually better this week He is seen today in conjunction with HBO 1/21; returns to clinic. He was up in [place] for a day or 2 attending a funeral. He comes back in with the wound larger and with a large area of exposed bone. He had osteomyelitis and septic arthritis of the fifth left metatarsal head while he was in hospital. He received IV antibiotics in the hospital for a prolonged period of time then 3 weeks of Augmentin. Subsequently I gave him 2 weeks of doxycycline for more superficial wound infection. When I saw this last week the wound was smaller the surface of the wound looks satisfactory. 1/28; patient missed hyperbarics today. Bone biopsy I did last time showed Enterococcus faecalis and Staphylococcus lugdunensis . He has a wide area of exposed bone. We are going to use silver alginate as of today. I had another ethical discussion with the patient. 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 In this situation I think the likelihood of healing this is low. Therefore I have recommended a ray  amputation and with the patient's agreement I have referred him to Dr. Doran Durand. The other issue is that his compliance with hyperbarics has been minimal because of his work schedule and given his underlying decision I am going to stop this today READMISSION 10/24/2019 MRI 09/29/2019 left foot IMPRESSION: 1. Apparent skin ulceration inferior and lateral to the 5th metatarsal base with underlying heterogeneous T2 signal and enhancement in the subcutaneous fat. Small peripherally enhancing fluid collections along the plantar and lateral aspects of the 5th metatarsal base suspicious for abscesses. 2. Interval amputation through the mid 5th metatarsal with nonspecific low-level marrow edema and enhancement. Given the proximity to the adjacent soft tissue inflammatory changes, osteomyelitis cannot be excluded. 3. The additional bones appear unremarkable. MRI 09/29/2019 right foot IMPRESSION: 1. Soft tissue ulceration lateral to the 5th MTP joint. There is low-level T2 hyperintensity within the 4th and 5th metatarsal heads and adjacent proximal phalanges without abnormal T1 signal or cortical destruction. These findings are nonspecific and could be seen with early marrow edema, hyperemia or early osteomyelitis. No evidence of septic joint. 2. Mild tenosynovitis and synovial enhancement associated with the extensor digitorum tendons at the level of the midfoot. 3. Diffuse low-level muscular T2 hyperintensity and enhancement, most consistent with diabetic myopathy. LEFT FOOT BONE Methicillin resistant staphylococcus aureus Staphylococcus lugdunensis MIC MIC CIPROFLOXACIN >=8 RESISTANT  Resistant <=0.5 SENSI... Sensitive CLINDAMYCIN <=0.25 SENS... Sensitive >=8 RESISTANT Resistant ERYTHROMYCIN >=8 RESISTANT Resistant >=8 RESISTANT Resistant GENTAMICIN <=0.5 SENSI... Sensitive <=0.5 SENSI... Sensitive Inducible Clindamycin NEGATIVE Sensitive NEGATIVE Sensitive OXACILLIN >=4 RESISTANT Resistant 2  SENSITIVE Sensitive RIFAMPIN <=0.5 SENSI... Sensitive <=0.5 SENSI... Sensitive TETRACYCLINE <=1 SENSITIVE Sensitive <=1 SENSITIVE Sensitive TRIMETH/SULFA <=10 SENSIT Sensitive <=10 SENSIT Sensitive ... Marland Kitchen.. VANCOMYCIN 1 SENSITIVE Sensitive <=0.5 SENSI... Sensitive Right foot bone . Component 3 wk ago Specimen Description BONE Special Requests RIGHT 4 METATARSAL SAMPLE B Gram Stain NO WBC SEEN NO ORGANISMS SEEN Culture RARE METHICILLIN RESISTANT STAPHYLOCOCCUS AUREUS NO ANAEROBES ISOLATED Performed at [facility], [place] [address] [place], [place] [id number] Report Status 10/08/2019 FINAL Organism ID, Bacteria METHICILLIN RESISTANT STAPHYLOCOCCUS AUREUS Resulting Agency [facility] Susceptibility Methicillin resistant staphylococcus aureus MIC CIPROFLOXACIN >=8 RESISTANT Resistant CLINDAMYCIN <=0.25 SENS... Sensitive ERYTHROMYCIN >=8 RESISTANT Resistant GENTAMICIN <=0.5 SENSI... Sensitive Inducible Clindamycin NEGATIVE Sensitive OXACILLIN >=4 RESISTANT Resistant RIFAMPIN <=0.5 SENSI... Sensitive TETRACYCLINE <=1 SENSITIVE Sensitive TRIMETH/SULFA <=10 SENSIT Sensitive ... VANCOMYCIN 1 SENSITIVE Sensitive This is a patient we had in clinic earlier this year with a wound over his left fifth metatarsal head. 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 Doran Durand on 05/16/2019. He seems to have developed problems with wounds on his bilateral feet in June 2021 although he may have had problems earlier than this. He was in an urgent care with a right foot ulcer on 09/26/2019 and given a course of doxycycline. This was apparently after having trouble getting into see orthopedics. He was seen by podiatry  on 09/28/2019 noted to have bilateral lower extremity ulcers including the left lateral fifth metatarsal base and the right subfifth met head. It was noted that had purulent drainage at that time. He required hospitalization from 6/20 through 7/2. This was because of worsening right foot wounds. He underwent bilateral operative incision and drainage and bone biopsies bilaterally. Culture results are listed above. He has been referred back to clinic by Dr. Jacqualyn Posey of podiatry. He is also followed by Dr. Megan Salon who saw him yesterday. He was discharged from hospital on Zyvox Flagyl and Levaquin and yesterday changed to doxycycline Flagyl and Levaquin. His inflammatory markers on 6/26 showed a sedimentation rate of 129 and a C-reactive protein of 5. This is improved to 14 and 1.3 respectively. This would indicate improvement. ABIs in our clinic today were 1.23 on the right and 1.20 on the left 11/01/2019 on evaluation today patient appears to be doing fairly well in regard to the wounds on his feet at this point. Fortunately there is no signs of active infection at this time. No fevers, chills, nausea, vomiting, or diarrhea. He currently is seeing infectious disease and still under their care at this point. Subsequently he also has both wounds which she has not been using collagen on as he did not receive that in his packaging he did not call us and let us know that. Apparently that just was missed on the order. Nonetheless we will get that straightened out today. 8/9-Patient returns for bilateral foot wounds, using Prisma with hydrogel moistened dressings, and the wounds appear stable. Patient using surgical shoes, avoiding much pressure or weightbearing as much as possible 8/16; patient has bilateral foot wounds.  1 on the right lateral foot proximally the other is on the left mid lateral foot. Both required debridement of callus and thick skin around the wounds. We have been using silver collagen 8/27;  patient has bilateral lateral foot wounds. The area on the left substantially surrounded by callus and dry skin. This was removed from the wound edge. The underlying wound is small. The area on the right measured somewhat smaller today. 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 Unfortunately I did not update his antibiotics during today's visit. 9/10 I reviewed Dr. Hale Bogus last notes he felt he had completed antibiotics his inflammatory markers were reasonably well controlled. He has a small wound on the lateral left foot and a tiny area on the right which is just above closed. He is using Hydrofera Blue with border foam he has bilateral surgical shoes 9/24; 2 week f/u. doing well. right foot is closed. left foot still undermined. 10/14; right foot remains closed at the fifth met head. The area over the base of the left fifth metatarsal has a small open area but considerable undermining towards the plantar foot. Thick callus skin around this suggests an adequate pressure relief. We have talked about this. He says he is going to go back into his cam boot. I suggested a total contact cast he did not seem enamored with this suggestion 10/26; left foot base of the fifth metatarsal. Same condition as last time. He has skin over the area with an open wound however the skin is not adherent. He went to see Dr. Earleen Newport who did an x-ray and culture of his foot I have not reviewed the x-ray but the patient was not told anything. He is on doxycycline 11/11; since the patient was last here he was in the emergency room on 10/30 he was concerned about swelling in the left foot. They did not do any cultures or x-rays. They changed his antibiotics to cephalexin. Previous culture showed group B strep. The cephalexin is appropriate as doxycycline has less than predictable coverage. Arrives in clinic today with swelling over this area under the wound.  He also has a new wound on the right fifth metatarsal head 11/18; the patient has a difficult wound on the lateral aspect of the left fifth metatarsal head. The wound was almost ballotable last week I opened it slightly expecting to see purulence however there was just bleeding. I cultured this this was negative. X-ray unchanged. We are trying to get an MRI but I am not sure were going to be able to get this through his insurance. He also has an area on the right lateral fifth metatarsal head this looks healthier 12/3; the patient finally got our MRI. Surprisingly this did not show osteomyelitis. I did show the soft tissue ulceration at the lateral plantar aspect of the fifth metatarsal base with a tiny residual 6 mm abscess overlying the superficial fascia I have tried to culture this area I have not been able to get this to grow anything. Nevertheless the protruding tissue looks aggravated. I suspect we should try to treat the underlying "abscess with broad-spectrum antibiotics. I am going to start him on Levaquin and Flagyl. He has much less edema in his legs and I am going to continue to wrap his legs and see him weekly 12/10. I started Levaquin and Flagyl on him last week. He just picked up the Flagyl apparently there was some delay. The worry  is the wound on the left fifth metatarsal base which is substantial and worsening. His foot looks like he inverts at the ankle making this a weightbearing surface. Certainly no improvement in fact I think the measurements of this are somewhat worse. We have been using 12/17; he apparently just got the Levaquin yesterday this is 2 weeks after the fact. He has completed the Flagyl. The area over the left fifth metatarsal base still has protruding granulation tissue although it does not look quite as bad as it did some weeks ago. He has severe bilateral lymphedema although we have not been treating him for wounds on his legs this is definitely going to require  compression. There was so much edema in the left I did not wish to put him in a total contact cast today. I am going to increase his compression from 3-4 layer. The area on the right lateral fifth met head actually look quite good and superficial. 12/23; patient arrived with callus on the right fifth met head and the substantial hyper granulated callused wound on the base of his fifth metatarsal. He says he is completing his Levaquin in 2 days but I do not think that adds up with what I gave him but I will have to double check this. We are using Hydrofera Blue on both areas. My plan is to put the left leg in a cast the week after New Year's 04/06/2020; patient's wounds about the same. Right lateral fifth metatarsal head and left lateral foot over the base of the fifth metatarsal. There is undermining on the left lateral foot which I removed before application of total contact cast continuing with Hydrofera Blue new. Patient tells me he was seen by endocrinology today lab work was done [Dr. Kerr]. Also wondering whether he was referred to cardiology. I went over some lab work from previously does not have chronic renal failure certainly not nephrotic range proteinuria he does have very poorly controlled diabetes but this is not his most updated lab work. Hemoglobin A1c has been over 11 1/10; the patient had a considerable amount of leakage towards mid part of his left foot with macerated skin however the wound surface looks better the area on the right lateral fifth met head is better as well. I am going to change the dressing on the left foot under the total contact cast to silver alginate, continue with Hydrofera Blue on the right. 1/20; patient was in the total contact cast for 10 days. Considerable amount of drainage although the skin around the wound does not look too bad on the left foot. The area on the right fifth metatarsal head is closed. Our nursing staff reports large amount of drainage out  of the left lateral foot wound 1/25; continues with copious amounts of drainage described by our intake staff. PCR culture I did last week showed E. coli and Enterococcus faecalis and low quantities. Multiple resistance genes documented including extended spectrum beta lactamase, MRSA, MRSE, quinolone, tetracycline. The wound is not quite as good this week as it was 5 days ago but about the same size 2/3; continues with copious amounts of malodorous drainage per our intake nurse. The PCR culture I did 2 weeks ago showed E. coli and low quantities of Enterococcus. There were multiple resistance genes detected. I put Neosporin on him last week although this does not seem to have helped. The wound is slightly deeper today. Offloading continues to be an issue here although with the amount of drainage  she has a total contact cast is just not going to work 2/10; moderate amount of drainage. Patient reports he cannot get his stocking on over the dressing. I told him we have to do that the nurse gave him suggestions on how to make this work. The wound is on the bottom and lateral part of his left foot. Is cultured predominantly grew low amounts of Enterococcus, E. coli and anaerobes. There were multiple resistance genes detected including extended spectrum beta lactamase, quinolone, tetracycline. I could not think of an easy oral combination to address this so for now I am going to do topical antibiotics provided by [facility] I think the main agents here are vancomycin and an aminoglycoside. We have to be able to give him access to the wounds to get the topical antibiotic on 2/17; moderate amount of drainage this is unchanged. He has his Keystone topical antibiotic against the deep tissue culture organisms. He has been using this and changing the dressing daily. Silver alginate on the wound surface. 2/24; using Keystone antibiotic with silver alginate on the top. He had too much drainage for a total contact  cast at one point although I think that is improving and I think in the next week or 2 it might be possible to replace a total contact cast I did not do this today. In general the wound surface looks healthy however he continues to have thick rims of skin and subcutaneous tissue around the wide area of the circumference which I debrided 06/04/2020 upon evaluation today patient appears to be doing well in regard to his wound. I do feel like he is showing signs of improvement. There is little bit of callus and dead tissue around the edges of the wound as well as what appears to be a little bit of a sinus tract that is off to the side laterally I would perform debridement to clear that away today. 3/17; left lateral foot. The wound looks about the same as I remember. Not much depth surface looks healthy. No evidence of infection 3/25; left lateral foot. Wound surface looks about the same. Separating epithelium from the circumference. There really is no evidence of infection here however not making progress by my view 3/29; left lateral foot. Surface of the wound again looks reasonably healthy still thick skin and subcutaneous tissue around the wound margins. There is no evidence of infection. One of the concerns being brought up by the nurses has again the amount of drainage vis--vis continued use of a total contact cast 4/5; left lateral foot at roughly the base of the fifth metatarsal. Nice healthy looking granulated tissue with rims of epithelialization. The overall wound measurements are not any better but the tissue looks healthy. The only concern is the amount of drainage although he has no surrounding maceration with what we have been doing recently to absorb fluid and protect his skin. He also has lymphedema. 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 He states that the kerlix/coban under the TCC rolled down and could not get it back up. He has been using an offloading scooter and has somehow hurt his right foot using this device. This happened last week. He states that the side of his right  foot developed a blister and opened. The top of his foot also has a few small open wounds he thinks is due to his socks rubbing in his shoes. He has not been using any dressings to the wound. He denies purulent drainage, fever/chills or erythema to the wounds. 4/22; patient presents for 1 week follow-up. He developed new wounds to the right foot that were evaluated at last clinic visit. He continues to have a total contact cast to the left leg and he reports no issues. He has been using silver collagen to the right foot wounds with no issues. He denies purulent drainage, fever/chills or erythema to the right foot wounds. He has no complaints today 4/25; patient presents for 1 week follow-up. He has a total contact cast of the left leg and reports no issues. He has been using silver alginate to the right foot wound. He denies purulent drainage, fever/chills or erythema to the right foot wounds. 5/2 patient presents for 1 week follow-up. T contact cast on the left. The wound which is on the base of the plantar foot at the base of the fifth metatarsal otal actually looks quite good and dimensions continue to gradually contract. HOWEVER the area on the right lateral fifth metatarsal head is much larger than what I remember from 2 weeks ago. Once more is he has significant levels of hypergranulation. Noteworthy that he had this same hyper granulated response on his wound on the left foot at one point in time. So much so that he I thought there was an underlying fluid collection. Based on this I think this just needs debridement. 5/9; the wound on the left actually  continues to be gradually smaller with a healthy surface. Slight amount of drainage and maceration of the skin around but not too bad. However he has a large wound over the right fifth metatarsal head very much in the same configuration as his left foot wound was initially. I used silver nitrate to address the hyper granulated tissue no mechanical debridement 5/16; area on the left foot did not look as healthy this week deeper thick surrounding macerated skin and subcutaneous tissue. oo The area on the right foot fifth met head was about the same oo The area on the right ankle that we identified last week is completely broken down into an open wound presumably a stocking rubbing issue 5/23; patient has been using a total contact cast to the left side. He has been using silver alginate underneath. He has also been using silver alginate to the right foot wounds. He has no complaints today. He denies any signs of infection. 5/31; the left-sided wound looks some better measure smaller surface granulation looks better. We have been using silver alginate under the total contact cast oo The large area on his right fifth met head and right dorsal foot look about the same still using silver alginate 6/6; neither side is good as I was hoping although the surface area dimensions are better. A lot of maceration on his left and right foot around the wound edge. Area on the dorsal right foot looks better. He says he was traveling. I am not sure what does the amount of maceration around the plantar wounds may be drainage issues 6/13; in general the wound surfaces look quite good on both sides. Macerated skin and raised edges around the wound required debridement although in general especially on the left the surface area seems improved. oo The area on the right dorsal ankle is  about the same I thought this would not be such a problem to close 6/20; not much change in either wound although the one on the right  looks a little better. Both wounds have thick macerated edges to the skin requiring debridements. We have been using silver alginate. The area on the dorsal right ankle is still open I thought this would be closed. 6/28; patient comes in today with a marked deterioration in the right foot wound fifth met head. Wide area of exposed bone this is a drastic change from last time. The area on the left there we have been casting is stagnant. We have been using silver alginate in both wound areas. 7/5; bone culture I did for PCR last time was positive for Pseudomonas, group B strep, Enterococcus and Staph aureus. There was no suggestion of methicillin resistance or ampicillin resistant genes. This was resistant to tetracycline however He comes into the clinic today with the area over his right plantar fifth metatarsal head which had been doing so well 2 weeks ago completely necrotic feeling bone. I do not know that this is going to be salvageable. The left foot wound is certainly no smaller but it has a better surface and is superficial. 7/8; patient called in this morning to say that his total contact cast was rubbing against his foot. He states he is doing fine overall. He denies signs of infection. 7/12; continued deterioration in the wound over the right fifth metatarsal head crumbling bone. This is not going to be salvageable. The patient agrees and wants to be referred to Dr. Doran Durand which we will attempt to arrange as soon as possible. I am going to continue him on antibiotics as long as that takes so I will renew those today. The area on the left foot which is the base of the fifth metatarsal continues to look somewhat better. Healthy looking tissue no depth no debridement is necessary here. 7/20; the patient was kindly seen by Dr. Doran Durand of orthopedics on 10/19/2020. He agreed that he needed a ray amputation on the right and he said he would have a look at the fourth as well while he was  intraoperative. Towards this end we have taken him out of the total contact cast on the left we will put him in a wrap with Hydrofera Blue. As I understand things surgery is planned for 7/21 7/27; patient had his surgery last Thursday. 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 Apparently everything went well we did not still disturb that today The area on the left foot actually looks quite good. He has been much less mobile which probably explains this he did not seem to do well in the total contact cast secondary to drainage and maceration I think. 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 Fortunately there is no evidence of active infection at this time. No fevers, chills, nausea, vomiting, or diarrhea. Overall I think that he is actually doing extremely well. Nonetheless I do believe that he is staying off of this more following the surgery in his right foot that is the reason the left is doing so great. 8/16; left plantar foot wound. This looks smaller than the last time I saw this he is using Hydrofera Blue. The surgical wound on the right foot is being followed by Dr. Doran Durand we did not look at this today. He has surgical shoes  on both feet 8/23; left plantar foot wound not as good this week. Surrounding macerated skin and subcutaneous tissue everything looks moist and wet. I do not think he is offloading this adequately. He is using a surgical shoe Apparently the right foot surgical wound is not open although I did not check his foot 8/31; left plantar foot lateral aspect. Much improved this week. He has no maceration. Some improvement in the surface area of the wound but most impressively the depth is come in we are using silver alginate. The patient is a Product/process development scientist. He is asked that we write him a letter so he can go back to work. I have also tried to see if we can write  something that will allow him to limit the amount of time that he is on his foot at work. 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 Hopefully the latter can be avoided 9/6; I believe the patient missed an appointment last week. He arrives in today with a wound looking roughly the same certainly no better. Undermining laterally and also inferiorly. We used molecuLight today in training with the patient's permission.. We are using silver alginate 9/21 wound is measuring bigger this week although this may have to do with the aggressive circumferential debridement last week in response to the blush fluorescence on the MolecuLight. Culture I did last week showed significant MSSA and E. coli. I put him on Augmentin but he has not started it yet. We are also going to send this for compounded antibiotics at [facility]. There is no evidence of systemic infection 9/29; silver alginate. His Keystone arrived. He is completing Augmentin in 2 days. Offloading in a cam boot. Moderate drainage per our intake staff 10/5; using silver alginate. He has been using his [place]. He has completed his Augmentin. Per our intake nurse still a lot of drainage, far too much to consider a total contact cast. Wound measures about the same. He had the same undermining area that I defined last week from a roughly 11-3. I remove this today 10/12; using silver alginate he is using the [place]. He comes in for a nurse visit hence we are applying [employer] twice a week. Measuring slightly better today and less notable drainage. Extensive debridement of the wound edge last time 10/18; using topical Keystone and silver alginate and a soft cast. Wound measurements about the same. Drainage was through his soft cast. We are changing this twice a week Tuesdays and Friday 10/25; comes in with moderate drainage. Still using Keystone silver alginate and a soft cast.  Wound dimensions completely the same.He has a lot of edema in the left leg he has lymphedema. Asking for [name] to consider wrapping him as he cannot get his stocking on over the soft cast 11/2; comes in with moderate to large drainage slightly smaller in terms of width we have been using [place]. His wound looks satisfactory but not much improvement 11/4; patient presents today for ovulatory cast change. Has no issues or complaints today. He denies signs of infection. Patient History Information obtained from Patient. Family History No family history of Cancer, Diabetes, Hereditary Spherocytosis, Hypertension, Kidney Disease, Lung Disease, Seizures, Stroke, Thyroid Problems, Tuberculosis. Social History Never smoker, Marital Status - Single, Alcohol Use - Rarely, Drug Use - No History, Caffeine Use - Never. Medical History Eyes Denies history of Cataracts, Glaucoma, Optic Neuritis Ear/Nose/Mouth/Throat Denies history of Chronic  sinus problems/congestion, Middle ear problems Hematologic/Lymphatic Denies history of Anemia, Hemophilia, Human Immunodeficiency Virus, Lymphedema, Sickle Cell Disease Respiratory Denies history of Aspiration, Asthma, Chronic Obstructive Pulmonary Disease (COPD), Pneumothorax, Sleep Apnea, Tuberculosis Cardiovascular Denies history of Angina, Arrhythmia, Congestive Heart Failure, Coronary Artery Disease, Deep Vein Thrombosis, Hypertension, Hypotension, Myocardial Infarction, Peripheral Arterial Disease, Peripheral Venous Disease, Phlebitis, Vasculitis Gastrointestinal Denies history of Cirrhosis , Colitis, Crohnoos, Hepatitis A, Hepatitis B, Hepatitis C Endocrine Patient has history of Type II Diabetes Denies history of Type I Diabetes Immunological Denies history of Lupus Erythematosus, Raynaudoos, Scleroderma Integumentary (Skin) Denies history of History of Burn Musculoskeletal Denies history of Gout, Rheumatoid Arthritis, Osteoarthritis,  Osteomyelitis Neurologic Denies history of Dementia, Neuropathy, Quadriplegia, Paraplegia, Seizure Disorder Oncologic Denies history of Received Chemotherapy, Received Radiation Psychiatric Denies history of Anorexia/bulimia, Confinement Anxiety Hospitalization/Surgery History - 11/1-11/06/2018- sepsis foot infection. - 11/4-11/5 02 sats low respiratory distress. Objective Constitutional respirations regular, non-labored and within target range for patient.. Vitals Time Taken: 9:11 AM, Height: 77 in, Weight: 280 lbs, BMI: 33.2, Temperature: 97.8 F, Pulse: 78 bpm, Respiratory Rate: 18 breaths/min, Blood Pressure: 143/72 mmHg, Capillary Blood Glucose: 134 mg/dl. General Notes: glucose per pt report Psychiatric pleasant and cooperative. General Notes: Left plantar foot wound with granulation tissue and circumferential undermining. No evidence of surrounding infection. Integumentary (Hair, Skin) Wound #3 status is Open. Original cause of wound was Trauma. The date acquired was: 10/02/2019. The wound has been in treatment 67 weeks. The wound is located on the Left,Lateral Foot. The wound measures 3.6cm length x 2.7cm width x 0.5cm depth; 7.634cm^2 area and 3.817cm^3 volume. There is Fat Layer (Subcutaneous Tissue) exposed. There is no tunneling noted, however, there is undermining starting at 2:00 and ending at 5:00 with a maximum distance of 0.2cm. There is a large amount of serosanguineous drainage noted. The wound margin is epibole. There is large (67-100%) red, pink granulation within the wound bed. There is no necrotic tissue within the wound bed. 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 He had no issues with the cast placement on 11/2. We will continue with silver alginate under the cast. No signs of infection on  exam. Procedures Wound #3 Pre-procedure diagnosis of Wound #3 is a Diabetic Wound/Ulcer of the Lower Extremity located on the Left,Lateral Foot . There was a T Programmer, multimedia otal Procedure by [name], DO. Post procedure Diagnosis Wound #3: Same as Pre-Procedure Plan Follow-up Appointments: Return Appointment in 1 week. - with Dr. Dellia Nims Other: - MD visit Friday 11/4 for cast change Edema Control - Lymphedema / SCD / Other: Elevate legs to the level of the heart or above for 30 minutes daily and/or when sitting, a frequency of: - throughout the day Avoid standing for long periods of time. Exercise regularly Moisturize legs daily. - right leg every night before bed. Compression stocking or Garment 20-30 mm/Hg pressure to: - Apply to right leg in the morning and remove at night. Off-Loading: T Contact Cast to Left Lower Extremity otal Open toe surgical shoe to: - Left Foot with soft cast Other: - minimal weight bearing left foot Additional Orders / Instructions: Follow Nutritious Diet WOUND #3: - Foot Wound Laterality: Left, Lateral Cleanser: Soap and Water 2 x Per Week/30 Days Discharge Instructions: May shower and wash wound with dial antibacterial soap and water prior to dressing change. Cleanser: Wound Cleanser (Generic) 2 x  Per Week/30 Days Discharge Instructions: Cleanse the wound with wound cleanser prior to applying a clean dressing using gauze sponges, not tissue or cotton balls. Peri-Wound Care: Zinc Oxide Ointment 30g tube 2 x Per Week/30 Days Discharge Instructions: Apply Zinc Oxide to periwound with each dressing change as needed. Topical: keystone antibiotic compound 2 x Per Week/30 Days Discharge Instructions: thin layer to wound bed Prim Dressing: KerraCel Ag Gelling Fiber Dressing, 4x5 in (silver alginate) (Generic) 2 x Per Week/30 Days ary Discharge Instructions: Apply silver alginate to wound bed as instructed Secondary Dressing: ABD Pad, 8x10 2 x Per  Week/30 Days Discharge Instructions: Apply over primary dressing as directed. Secondary Dressing: Zetuvit Plus 4x8 in (Generic) 2 x Per Week/30 Days Discharge Instructions: Apply over primary dressing as directed. Com pression Wrap: Kerlix Roll 4.5x3.1 (in/yd) 2 x Per Week/30 Days Discharge Instructions: Apply Kerlix and Coban compression as directed. Com pression Wrap: Coban Self-Adherent Wrap 4x5 (in/yd) 2 x Per Week/30 Days Discharge Instructions: Apply over Kerlix as directed. 1. T contact cast placed in office otal 2. Silver alginate 3. Follow-up next week Electronic Signature(s) Signed: 02/05/2021 10:24:39 AM By: [name] DO Entered By: [name] on 02/05/2021 09:42:15 -------------------------------------------------------------------------------- HxROS Details Patient Name: Date of Service: [name], [name] 02/05/2021 8:30 A M Medical Record Number: [id number] Patient Account Number: [id number] Date of Birth/Sex: Treating RN: 03-15-1986 (35 y.o. [name] Primary Care Provider: [name] Other Clinician: Referring Provider: Treating Provider/Extender: [name] in Treatment: 75 Information Obtained From Patient Eyes Medical History: Negative for: Cataracts; Glaucoma; Optic Neuritis Ear/Nose/Mouth/Throat Medical History: Negative for: Chronic sinus problems/congestion; Middle ear problems Hematologic/Lymphatic Medical History: Negative for: Anemia; Hemophilia; Human Immunodeficiency Virus; Lymphedema; Sickle Cell Disease Respiratory Medical History: Negative for: Aspiration; Asthma; Chronic Obstructive Pulmonary Disease (COPD); Pneumothorax; Sleep Apnea; Tuberculosis Cardiovascular Medical History: Negative for: Angina; Arrhythmia; Congestive Heart Failure; Coronary Artery Disease; Deep Vein Thrombosis; Hypertension; Hypotension; Myocardial Infarction; Peripheral Arterial Disease; Peripheral Venous Disease;  Phlebitis; Vasculitis Gastrointestinal Medical History: Negative for: Cirrhosis ; Colitis; Crohns; Hepatitis A; Hepatitis B; Hepatitis C Endocrine Medical History: Positive for: Type II Diabetes Negative for: Type I Diabetes Time with diabetes: 8 Treated with: Insulin Blood sugar tested every day: No Immunological Medical History: Negative for: Lupus Erythematosus; Raynauds; Scleroderma Integumentary (Skin) Medical History: Negative for: History of Burn Musculoskeletal Medical History: Negative for: Gout; Rheumatoid Arthritis; Osteoarthritis; Osteomyelitis Neurologic Medical History: Negative for: Dementia; Neuropathy; Quadriplegia; Paraplegia; Seizure Disorder Oncologic Medical History: Negative for: Received Chemotherapy; Received Radiation Psychiatric Medical History: Negative for: Anorexia/bulimia; Confinement Anxiety Immunizations Pneumococcal Vaccine: Received Pneumococcal Vaccination: No Implantable Devices None Hospitalization / Surgery History Type of Hospitalization/Surgery 11/1-11/06/2018- sepsis foot infection 11/4-11/5 02 sats low respiratory distress Family and Social History Cancer: No; Diabetes: No; Hereditary Spherocytosis: No; Hypertension: No; Kidney Disease: No; Lung Disease: No; Seizures: No; Stroke: No; Thyroid Problems: No; Tuberculosis: No; Never smoker; Marital Status - Single; Alcohol Use: Rarely; Drug Use: No History; Caffeine Use: Never; Financial Concerns: No; Food, Clothing or Shelter Needs: No; Support System Lacking: No; Transportation Concerns: No Electronic Signature(s) Signed: 02/05/2021 10:24:39 AM By: [name] DO Signed: 02/05/2021 2:36:44 PM By: Deon Pilling RN, BSN Entered By: [name] on 02/05/2021 09:37:41 -------------------------------------------------------------------------------- Total Contact Cast Details Patient Name: Date of Service: [name], [name] 02/05/2021 8:30 A M Medical Record Number:  [id number] Patient Account Number: [id number] Date of Birth/Sex: Treating RN: Jun 03, 1985 (35 y.o. [name] Primary Care Provider: [name] Other Clinician:  Referring Provider: Treating Provider/Extender: [name] in Treatment: 67 T Contact Cast Applied for Wound Assessment: otal Wound #3 Left,Lateral Foot Performed By: Physician [name], DO Post Procedure Diagnosis Same as Pre-procedure Electronic Signature(s) Signed: 02/05/2021 10:24:39 AM By: [name] DO Signed: 02/05/2021 2:36:22 PM By: [name] RN, BSN Entered By: [name] on 02/05/2021 09:20:08 -------------------------------------------------------------------------------- SuperBill Details Patient Name: Date of Service: [name], [name] 02/05/2021 Medical Record Number: [id number] Patient Account Number: [id number] Date of Birth/Sex: Treating RN: 03/27/1986 (35 y.o. [name] Primary Care Provider: [name] Other Clinician: Referring Provider: Treating Provider/Extender: [name] in Treatment: 38 Diagnosis Coding ICD-10 Codes Code Description E11.621 Type 2 diabetes mellitus with foot ulcer L97.528 Non-pressure chronic ulcer of other part of left foot with other specified severity Facility Procedures CPT4 Code: [id number] Description: 29445 - APPLY TOTAL CONTACT LEG CAST ICD-10 Diagnosis Description E11.621 Type 2 diabetes mellitus with foot ulcer L97.528 Non-pressure chronic ulcer of other part of left foot with other specified sever Modifier: ity Quantity: 1 Physician Procedures : CPT4 Code Description Modifier [id number] - WC PHYS APPLY TOTAL CONTACT CAST ICD-10 Diagnosis Description E11.621 Type 2 diabetes mellitus with foot ulcer L97.528 Non-pressure chronic ulcer of other part of left foot with other specified severity Quantity: 1 Electronic Signature(s) Signed: 02/05/2021 10:24:39 AM By: [name] DO Entered By: [name] on 02/05/2021 10:24:15

## 2021-02-05 NOTE — Progress Notes (Signed)
[name] ([id number]) Visit Report for 02/05/2021 Arrival Information Details Patient Name: Date of Service: [name] D 02/05/2021 8:30 A M Medical Record Number: [id number] Patient Account Number: [id number] Date of Birth/Sex: Treating RN: 1985-10-20 (35 y.o. [name] Primary Care Blakely Maranan: [name] Other Clinician: Referring Kester Stimpson: Treating Dondre Catalfamo/Extender: [name] in Treatment: 73 Visit Information History Since Last Visit Added or deleted any medications: No Patient Arrived: Ambulatory Any new allergies or adverse reactions: No Arrival Time: 09:11 Had a fall or experienced change in No Accompanied By: alone activities of daily living that may affect Transfer Assistance: None risk of falls: Patient Identification Verified: Yes Signs or symptoms of abuse/neglect since last visito No Secondary Verification Process Completed: Yes Hospitalized since last visit: No Patient Requires Transmission-Based Precautions: No Implantable device outside of the clinic excluding No Patient Has Alerts: No cellular tissue based products placed in the center since last visit: Has Dressing in Place as Prescribed: Yes Has Footwear/Offloading in Place as Prescribed: Yes Left: T Contact Cast otal Pain Present Now: No Electronic Signature(s) Signed: 02/05/2021 2:36:22 PM By: [name] RN, BSN Entered By: [name] on 02/05/2021 09:11:47 -------------------------------------------------------------------------------- Encounter Discharge Information Details Patient Name: Date of Service: [name], [name] 02/05/2021 8:30 A M Medical Record Number: [id number] Patient Account Number: [id number] Date of Birth/Sex: Treating RN: [birth year] (35 y.o. [name] Primary Care Alaija Ruble: [name] Other Clinician: Referring Camry Theiss: Treating Ayame Rena/Extender: [name] in Treatment:  61 Encounter Discharge Information Items Discharge Condition: Stable Ambulatory Status: Ambulatory Discharge Destination: Home Transportation: Private Auto Accompanied By: alone Schedule Follow-up Appointment: Yes Clinical Summary of Care: Patient Declined Electronic Signature(s) Signed: 02/05/2021 2:36:22 PM By: [name] RN, BSN Entered By: [name] on 02/05/2021 11:13:16 -------------------------------------------------------------------------------- Lower Extremity Assessment Details Patient Name: Date of Service: [name], [name] 02/05/2021 8:30 A M Medical Record Number: [id number] Patient Account Number: [id number] Date of Birth/Sex: Treating RN: [birth year] (35 y.o. [name] Primary Care Kasondra Junod: [name] Other Clinician: Referring Najae Rathert: Treating Stevan Eberwein/Extender: [name] in Treatment: 67 Edema Assessment Assessed: [name]: No] [name]: No] Edema: [Left: Ye] [Right: s] Calf Left: Right: Point of Measurement: 48 cm From Medial Instep 52 cm Ankle Left: Right: Point of Measurement: 11 cm From Medial Instep 28 cm Vascular Assessment Pulses: Dorsalis Pedis Palpable: [Left:Yes] Electronic Signature(s) Signed: 02/05/2021 2:36:22 PM By: [name] RN, BSN Entered By: [name] on 02/05/2021 09:12:38 -------------------------------------------------------------------------------- Multi Wound Chart Details Patient Name: Date of Service: [name], [name] 02/05/2021 8:30 A M Medical Record Number: [id number] Patient Account Number: [id number] Date of Birth/Sex: Treating RN: [birth year] (35 y.o. [name] Primary Care Nadine Ryle: [name] Other Clinician: Referring Demri Poulton: Treating Jonuel Butterfield/Extender: [name] in Treatment: 58 Vital Signs Height(in): 77 Capillary Blood Glucose(mg/dl): 134 Weight(lbs): 280 Pulse(bpm): 42 Body Mass Index(BMI): 33 Blood  Pressure(mmHg): 143/72 Temperature(F): 97.8 Respiratory Rate(breaths/min): 18 Photos: [3:No Photos Left, Lateral Foot] [N/A:N/A N/A] Wound Location: [3:Trauma] [N/A:N/A] Wounding Event: [3:Diabetic Wound/Ulcer of the Lower] [N/A:N/A] Primary Etiology: [3:Extremity Type II Diabetes] [N/A:N/A] Comorbid History: [3:10/02/2019] [N/A:N/A] Date Acquired: [3:67] [N/A:N/A] Weeks of Treatment: [3:Open] [N/A:N/A] Wound Status: [3:3.6x2.7x0.5] [N/A:N/A] Measurements L x W x D (cm) [3:7.634] [N/A:N/A] A (cm) : rea [3:3.817] [N/A:N/A] Volume (cm) : [3:-362.90%] [N/A:N/A] % Reduction in A rea: [3:-2213.30%] [N/A:N/A] % Reduction in Volume: [3:2] Starting Position 1 (o'clock): [3:5] Ending Position 1 (o'clock): [3:0.2] Maximum Distance 1 (cm): [  3:Yes] [N/A:N/A] Undermining: [3:Grade 2] [N/A:N/A] Classification: [3:Large] [N/A:N/A] Exudate A mount: [3:Serosanguineous] [N/A:N/A] Exudate Type: [3:red, brown] [N/A:N/A] Exudate Color: [3:Epibole] [N/A:N/A] Wound Margin: [3:Large (67-100%)] [N/A:N/A] Granulation A mount: [3:Red, Pink] [N/A:N/A] Granulation Quality: [3:None Present (0%)] [N/A:N/A] Necrotic A mount: [3:Fat Layer (Subcutaneous Tissue): Yes N/A] Exposed Structures: [3:Fascia: No Tendon: No Muscle: No Joint: No Bone: No Small (1-33%)] [N/A:N/A] Epithelialization: [3:T Contact Cast otal] [N/A:N/A] Treatment Notes Electronic Signature(s) Signed: 02/05/2021 10:24:39 AM By: [name] DO Signed: 02/05/2021 2:36:44 PM By: Deon Pilling RN, BSN Entered By: [name] on 02/05/2021 09:34:51 -------------------------------------------------------------------------------- Multi-Disciplinary Care Plan Details Patient Name: Date of Service: [name], [name] 02/05/2021 8:30 A M Medical Record Number: [id number] Patient Account Number: [id number] Date of Birth/Sex: Treating RN: [birth year] (35 y.o. [name] Primary Care Kincaid Tiger: [name] Other  Clinician: Referring Sacred Roa: Treating Dayon Witt/Extender: [name] in Treatment: 8 Multidisciplinary Care Plan reviewed with physician Active Inactive Nutrition Nursing Diagnoses: Imbalanced nutrition Potential for alteratiion in Nutrition/Potential for imbalanced nutrition Goals: Patient/caregiver agrees to and verbalizes understanding of need to use nutritional supplements and/or vitamins as prescribed Date Initiated: 10/24/2019 Date Inactivated: 04/06/2020 Target Resolution Date: 04/03/2020 Goal Status: Met Patient/caregiver will maintain therapeutic glucose control Date Initiated: 10/24/2019 Target Resolution Date: 03/02/2021 Goal Status: Active Interventions: Assess HgA1c results as ordered upon admission and as needed Assess patient nutrition upon admission and as needed per policy Provide education on elevated blood sugars and impact on wound healing Provide education on nutrition Treatment Activities: Education provided on Nutrition : 12/02/2020 Notes: 11/17/20: Glucose control ongoing issue, target date extended. 01/26/21: Glucose management continues. Wound/Skin Impairment Nursing Diagnoses: Impaired tissue integrity Knowledge deficit related to ulceration/compromised skin integrity Goals: Patient/caregiver will verbalize understanding of skin care regimen Date Initiated: 10/24/2019 Target Resolution Date: 03/02/2021 Goal Status: Active Ulcer/skin breakdown will have a volume reduction of 30% by week 4 Date Initiated: 10/24/2019 Date Inactivated: 01/16/2020 Target Resolution Date: 01/10/2020 Unmet Reason: no change in Goal Status: Unmet measurements. Interventions: Assess patient/caregiver ability to obtain necessary supplies Assess patient/caregiver ability to perform ulcer/skin care regimen upon admission and as needed Assess ulceration(s) every visit Provide education on ulcer and skin care Notes: 11/17/20: Wound care regimen  continues Electronic Signature(s) Signed: 02/05/2021 2:36:22 PM By: [name] RN, BSN Entered By: [name] on 02/05/2021 09:14:32 -------------------------------------------------------------------------------- Pain Assessment Details Patient Name: Date of Service: [name], [name] 02/05/2021 8:30 A M Medical Record Number: [id number] Patient Account Number: [id number] Date of Birth/Sex: Treating RN: 23-Dec-1985 (35 y.o. [name] Primary Care Aymar Whitfill: [name] Other Clinician: Referring Bradleigh Sonnen: Treating Onya Eutsler/Extender: [name] in Treatment: 68 Active Problems Location of Pain Severity and Description of Pain Patient Has Paino No Site Locations Pain Management and Medication Current Pain Management: Electronic Signature(s) Signed: 02/05/2021 2:36:22 PM By: [name] RN, BSN Entered By: [name] on 02/05/2021 09:12:34 -------------------------------------------------------------------------------- Patient/Caregiver Education Details Patient Name: Date of Service: [name], [name] 11/4/2022andnbsp8:30 A M Medical Record Number: [id number] Patient Account Number: [id number] Date of Birth/Gender: Treating RN: 1985-07-23 (35 y.o. [name] Primary Care Physician: [name] Other Clinician: Referring Physician: Treating Physician/Extender: [name] in Treatment: 49 Education Assessment Education Provided To: Patient Education Topics Provided Wound/Skin Impairment: Methods: Explain/Verbal Responses: State content correctly [employer]) Signed: 02/05/2021 2:36:22 PM By: [name] RN, BSN Entered By: [name] on 02/05/2021 09:14:58 -------------------------------------------------------------------------------- Wound Assessment Details Patient Name: Date of Service: [name],  [name] 02/05/2021 8:30 A M Medical Record Number:  [id number] Patient Account Number: [id number] Date of Birth/Sex: Treating RN: 1985/12/14 (35 y.o. [name] Primary Care Lucillie Kiesel: [name] Other Clinician: Referring Montserrath Madding: Treating Kinnedy Mongiello/Extender: [name] in Treatment: 72 Wound Status Wound Number: 3 Primary Etiology: Diabetic Wound/Ulcer of the Lower Extremity Wound Location: Left, Lateral Foot Wound Status: Open Wounding Event: Trauma Comorbid History: Type II Diabetes Date Acquired: 10/02/2019 Weeks Of Treatment: 67 Clustered Wound: No Wound Measurements Length: (cm) 3.6 Width: (cm) 2.7 Depth: (cm) 0.5 Area: (cm) 7.634 Volume: (cm) 3.817 Wound Description Classification: Grade 2 Wound Margin: Epibole Exudate Amount: Large Exudate Type: Serosanguineous Exudate Color: red, brown Foul Odor After Cleansing: Slough/Fibrino % Reduction in Area: -362.9% % Reduction in Volume: -2213.3% Epithelialization: Small (1-33%) Tunneling: No Undermining: Yes Starting Position (o'clock): 2 Ending Position (o'clock): 5 Maximum Distance: (cm) 0.2 No No Wound Bed Granulation Amount: Large (67-100%) Exposed Structure Granulation Quality: Red, Pink Fascia Exposed: No Necrotic Amount: None Present (0%) Fat Layer (Subcutaneous Tissue) Exposed: Yes Tendon Exposed: No Muscle Exposed: No Joint Exposed: No Bone Exposed: No Treatment Notes Wound #3 (Foot) Wound Laterality: Left, Lateral Cleanser Soap and Water Discharge Instruction: May shower and wash wound with dial antibacterial soap and water prior to dressing change. Wound Cleanser Discharge Instruction: Cleanse the wound with wound cleanser prior to applying a clean dressing using gauze sponges, not tissue or cotton balls. Peri-Wound Care Zinc Oxide Ointment 30g tube Discharge Instruction: Apply Zinc Oxide to periwound with each dressing change as needed. Topical keystone antibiotic compound Discharge Instruction: thin  layer to wound bed Primary Dressing KerraCel Ag Gelling Fiber Dressing, 4x5 in (silver alginate) Discharge Instruction: Apply silver alginate to wound bed as instructed Secondary Dressing ABD Pad, 8x10 Discharge Instruction: Apply over primary dressing as directed. Zetuvit Plus 4x8 in Discharge Instruction: Apply over primary dressing as directed. Secured With Compression Wrap Kerlix Roll 4.5x3.1 (in/yd) Discharge Instruction: Apply Kerlix and Coban compression as directed. Coban Self-Adherent Wrap 4x5 (in/yd) Discharge Instruction: Apply over Kerlix as directed. Compression Stockings Add-Ons Electronic Signature(s) Signed: 02/05/2021 2:36:22 PM By: [name] RN, BSN Entered By: [name] on 02/05/2021 09:13:06 -------------------------------------------------------------------------------- [place] Details Patient Name: Date of Service: [name], [name] 02/05/2021 8:30 A M Medical Record Number: [id number] Patient Account Number: [id number] Date of Birth/Sex: Treating RN: Jan 04, 1986 (35 y.o. [name] Primary Care Kjersti Dittmer: [name] Other Clinician: Referring Jeannifer Drakeford: Treating Nikolai Wilczak/Extender: [name] in Treatment: 17 Vital Signs Time Taken: 09:11 Temperature (F): 97.8 Height (in): 77 Pulse (bpm): 78 Weight (lbs): 280 Respiratory Rate (breaths/min): 18 Body Mass Index (BMI): 33.2 Blood Pressure (mmHg): 143/72 Capillary Blood Glucose (mg/dl): 134 Reference Range: 80 - 120 mg / dl Notes glucose per pt report Electronic Signature(s) Signed: 02/05/2021 2:36:22 PM By: [name] RN, BSN Entered By: [name] on 02/05/2021 09:12:29

## 2021-02-10 ENCOUNTER — Other Ambulatory Visit: Payer: Self-pay

## 2021-02-10 ENCOUNTER — Encounter (HOSPITAL_BASED_OUTPATIENT_CLINIC_OR_DEPARTMENT_OTHER): Payer: BC Managed Care – PPO | Admitting: Internal Medicine

## 2021-02-10 DIAGNOSIS — E11621 Type 2 diabetes mellitus with foot ulcer: Secondary | ICD-10-CM | POA: Diagnosis not present

## 2021-02-11 NOTE — Progress Notes (Signed)
[name] ([id number]) Visit Report for 02/10/2021 HPI Details Patient Name: Date of Service: [name] D 02/10/2021 7:30 A M Medical Record Number: [id number] Patient Account Number: [id number] Date of Birth/Sex: Treating RN: [birth year] (35 y.o. 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 He comes in for review of a wound over the plantar fifth metatarsal head extending into the lateral part of the foot. He was followed for this previously by his podiatrist Dr. Cornelius Moras. As the patient tells his story he went to see podiatry first for a swelling he developed on the lateral part of his fifth metatarsal head in May. He states this was "open" by podiatry and the area closed. He was followed up in June and it was again opened callus removed and it closed promptly. There were plans being made for surgery on the fifth metatarsal head in June however his blood sugar was apparently too high for anesthesia. Apparently the area was debrided and opened again in June and it is never closed since. Looking over the records from podiatry I am really not able to follow this. It was clear when he was first seen it was before 5/14 at that point he already had a wound. By 5/17 the ulcer was resolved. I do not see anything about a procedure. On 5/28 noted to have pre-ulcerative moderate keratosis. X-ray noted 1/5 contracted toe and tailor's bunion and metatarsal deformity. On a visit date on 09/28/2018 the dorsal part of the left foot it healed and resolved. There was concern about swelling in his lower extremity he was sent to the ER.. As far as I can tell he was seen in the ER on 7/12 with an ulcer on his left foot. A DVT rule  out of the left leg was negative. I do not think I have complete records from podiatry but I am not able to verify the procedures this patient states he had. He states after the last procedure the wound has never closed although I am not able to follow this in the records I have from podiatry. He has not had a recent x-ray The patient has been using Neosporin on the wound. He is wearing a Darco shoe. He is still very active up on his foot working and exercising. Past medical history; type 2 diabetes ketosis-prone, leg swelling with a negative DVT study in July. Non-smoker ABI in our clinic was 0.85 on the left 10/16; substantial wound on the plantar left fifth met head extending laterally almost to the dorsal fifth MTP. We have been using silver alginate we gave him a Darco forefoot off loader. An x-ray did not show evidence of osteomyelitis did note soft tissue emphysema which I think was due to gas tracking through an open wound. There is no doubt in my mind he requires an MRI 10/23; MRI not booked until 3 November at the earliest this is largely due to his glucose sensor in the right arm. We have been using silver alginate. There has been an improvement 10/29; I am still not exactly sure when his MRI is booked for. He says it is the third but it is the 10th in epic. This definitely needs to be done. He is running a low-grade fever today but no other symptoms. No real  improvement in the 1 02/26/2019 patient presents today for a follow-up visit here in our clinic he is last been seen in the clinic on October 29. Subsequently we were working on getting MRI to evaluate and see what exactly was going on and where we would need to go from the standpoint of whether or not he had osteomyelitis and again what treatments were going be required. Subsequently the patient ended up being admitted to the hospital on 02/07/2019 and was discharged on 02/14/2019. This is a somewhat interesting admission with a  discharge diagnosis of pneumonia due to COVID-19 although he was positive for COVID-19 when tested at the urgent care but negative x2 when he was actually in the hospital. With that being said he did have acute respiratory failure with hypoxia and it was noted he also have a left foot ulceration with osteomyelitis. With that being said he did require oxygen for his pneumonia and I level 4 L. He was placed on antivirals and steroids for the COVID-19. He was also transferred to the [facility] at one point. Nonetheless he did subsequently discharged home and since being home has done much better in that regard. The CT angiogram did not show any pulmonary embolism. With regard to the osteomyelitis the patient was placed on vancomycin and Zosyn while in the hospital but has been changed to Augmentin at discharge. It was also recommended that he follow- up with wound care and podiatry. Podiatry however wanted him to see [name] according to the patient prior to them doing anything further. His hemoglobin A1c was 9.9 as noted in the hospital. Have an MRI of the left foot performed while in the hospital on 02/04/2019. 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 There is an overlying plantar open wound noted an abscess tracking back along the lateral aspect of the fifth metatarsal shaft. There is otherwise diffuse cellulitis and mild fasciitis without findings of polymyositis. The patient did have recently pneumonia secondary to COVID-19 I looked in the chart through epic and it does appear that the patient may need to have an additional x-ray just to ensure everything is cleared and that he has no airspace disease prior to putting him into the [name]. 03/05/2019; patient was readmitted to the clinic last week. He was hospitalized twice for a viral upper respiratory tract infection from 11/1 through 11/4 and  then 11/5 through 11/12 ultimately this turned out to be Covid pneumonitis. Although he was discharged on oxygen he is not using it. He says he feels fine. He has no exercise limitation no cough no sputum. His O2 sat in our clinic today was 100% on room air. 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 He received Vanco and Zosyn in the hospital and then was discharged on 2 weeks of Augmentin. I do not see any relevant cultures. He was supposed to follow-up with infectious disease but I do not see that he has an appointment. 12/8; patient saw Dr. Novella Olive of infectious disease last week. He felt that he had had adequate antibiotic therapy. He did not go to follow-up with Dr. Amalia Hailey of podiatry and I have again talked to him about the pros and cons of this. He does not want to consider a ray amputation of this time. He is aware of the risks  of recurrence, migration etc. He started HBO today and tolerated this well. He can complete the Augmentin that I gave him last week. I have looked over the lab work that Dr. Chana Bode ordered his C-reactive protein was 3.3 and his sedimentation rate was 17. The C-reactive protein is never really been measurably that high in this patient 12/15; not much change in the wound today however he has undermining along the lateral part of the foot again more extensively than last week. He has some rims of epithelialization. We have been using silver alginate. He is undergoing hyperbarics but did not dive today 12/18; in for his obligatory first total contact cast change. Unfortunately there was pus coming from the undermining area around his fifth metatarsal head. This was cultured but will preclude reapplication of a cast. He is seen in conjunction with HBO 12/24; patient had staph lugdunensis in the wound in the undermining area laterally last time. We put him on  doxycycline which should have covered this. The wound looks better today. I am going to give him another week of doxycycline before reattempting the total contact cast 12/31; the patient is completing antibiotics. Hemorrhagic debris in the distal part of the wound with some undermining distally. He also had hyper granulation. Extensive debridement with a #5 curette. The infected area that was on the lateral part of the fifth met head is closed over. I do not think he needs any more antibiotics. Patient was seen prior to HBO. Preparations for a total contact cast were made in the cast will be placed post hyperbarics 04/11/19; once again the patient arrives today without complaint. He had been in a cast all week noted that he had heavy drainage this week. This resulted in large raised areas of macerated tissue around the wound 1/14; wound bed looks better slightly smaller. Hydrofera Blue has been changing himself. He had a heavy drainage last week which caused a lot of maceration around the wound so I took him out of a total contact cast he says the drainage is actually better this week He is seen today in conjunction with HBO 1/21; returns to clinic. He was up in [place] for a day or 2 attending a funeral. He comes back in with the wound larger and with a large area of exposed bone. He had osteomyelitis and septic arthritis of the fifth left metatarsal head while he was in hospital. He received IV antibiotics in the hospital for a prolonged period of time then 3 weeks of Augmentin. Subsequently I gave him 2 weeks of doxycycline for more superficial wound infection. When I saw this last week the wound was smaller the surface of the wound looks satisfactory. 1/28; patient missed hyperbarics today. Bone biopsy I did last time showed Enterococcus faecalis and Staphylococcus lugdunensis . He has a wide area of exposed bone. We are going to use silver alginate as of today. I had another ethical discussion  with the patient. 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 In this situation I think the likelihood of healing this is low. Therefore I have recommended a ray amputation and with the patient's agreement I have referred him to Dr. Doran Durand. The other issue is that his compliance with hyperbarics has been minimal because of his work schedule and given his underlying decision I am going to stop this today READMISSION 10/24/2019 MRI 09/29/2019 left foot IMPRESSION: 1. Apparent skin ulceration inferior and lateral to the 5th metatarsal base with underlying heterogeneous T2  signal and enhancement in the subcutaneous fat. Small peripherally enhancing fluid collections along the plantar and lateral aspects of the 5th metatarsal base suspicious for abscesses. 2. Interval amputation through the mid 5th metatarsal with nonspecific low-level marrow edema and enhancement. Given the proximity to the adjacent soft tissue inflammatory changes, osteomyelitis cannot be excluded. 3. The additional bones appear unremarkable. MRI 09/29/2019 right foot IMPRESSION: 1. Soft tissue ulceration lateral to the 5th MTP joint. There is low-level T2 hyperintensity within the 4th and 5th metatarsal heads and adjacent proximal phalanges without abnormal T1 signal or cortical destruction. These findings are nonspecific and could be seen with early marrow edema, hyperemia or early osteomyelitis. No evidence of septic joint. 2. Mild tenosynovitis and synovial enhancement associated with the extensor digitorum tendons at the level of the midfoot. 3. Diffuse low-level muscular T2 hyperintensity and enhancement, most consistent with diabetic myopathy. LEFT FOOT BONE Methicillin resistant staphylococcus aureus Staphylococcus lugdunensis MIC MIC CIPROFLOXACIN >=8 RESISTANT Resistant <=0.5 SENSI... Sensitive CLINDAMYCIN <=0.25 SENS... Sensitive >=8 RESISTANT Resistant ERYTHROMYCIN >=8  RESISTANT Resistant >=8 RESISTANT Resistant GENTAMICIN <=0.5 SENSI... Sensitive <=0.5 SENSI... Sensitive Inducible Clindamycin NEGATIVE Sensitive NEGATIVE Sensitive OXACILLIN >=4 RESISTANT Resistant 2 SENSITIVE Sensitive RIFAMPIN <=0.5 SENSI... Sensitive <=0.5 SENSI... Sensitive TETRACYCLINE <=1 SENSITIVE Sensitive <=1 SENSITIVE Sensitive TRIMETH/SULFA <=10 SENSIT Sensitive <=10 SENSIT Sensitive ... Marland Kitchen.. VANCOMYCIN 1 SENSITIVE Sensitive <=0.5 SENSI... Sensitive Right foot bone . Component 3 wk ago Specimen Description BONE Special Requests RIGHT 4 METATARSAL SAMPLE B Gram Stain NO WBC SEEN NO ORGANISMS SEEN Culture RARE METHICILLIN RESISTANT STAPHYLOCOCCUS AUREUS NO ANAEROBES ISOLATED Performed at [facility], [place] [address], [place], [place] [address] Report Status 10/08/2019 FINAL Organism ID, Bacteria METHICILLIN RESISTANT STAPHYLOCOCCUS AUREUS Resulting Agency [facility] Susceptibility Methicillin resistant staphylococcus aureus MIC CIPROFLOXACIN >=8 RESISTANT Resistant CLINDAMYCIN <=0.25 SENS... Sensitive ERYTHROMYCIN >=8 RESISTANT Resistant GENTAMICIN <=0.5 SENSI... Sensitive Inducible Clindamycin NEGATIVE Sensitive OXACILLIN >=4 RESISTANT Resistant RIFAMPIN <=0.5 SENSI... Sensitive TETRACYCLINE <=1 SENSITIVE Sensitive TRIMETH/SULFA <=10 SENSIT Sensitive ... VANCOMYCIN 1 SENSITIVE Sensitive This is a patient we had in clinic earlier this year with a wound over his left fifth metatarsal head. 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 Doran Durand on 05/16/2019. He seems to have developed problems with wounds on his bilateral feet in June 2021 although he may have had problems earlier than  this. He was in an urgent care with a right foot ulcer on 09/26/2019 and given a course of doxycycline. This was apparently after having trouble getting into see orthopedics. He was seen by podiatry on 09/28/2019 noted to have bilateral lower extremity ulcers including the left lateral fifth metatarsal base and the right subfifth met head. It was noted that had purulent drainage at that time. He required hospitalization from 6/20 through 7/2. This was because of worsening right foot wounds. He underwent bilateral operative incision and drainage and bone biopsies bilaterally. Culture results are listed above. He has been referred back to clinic by Dr. Jacqualyn Posey of podiatry. He is also followed by Dr. Megan Salon who saw him yesterday. He was discharged from hospital on Zyvox Flagyl and Levaquin and yesterday changed to doxycycline Flagyl and Levaquin. His inflammatory markers on 6/26 showed a sedimentation rate of 129 and a C-reactive protein of 5. This  is improved to 14 and 1.3 respectively. This would indicate improvement. ABIs in our clinic today were 1.23 on the right and 1.20 on the left 11/01/2019 on evaluation today patient appears to be doing fairly well in regard to the wounds on his feet at this point. Fortunately there is no signs of active infection at this time. No fevers, chills, nausea, vomiting, or diarrhea. He currently is seeing infectious disease and still under their care at this point. Subsequently he also has both wounds which she has not been using collagen on as he did not receive that in his packaging he did not call us and let us know that. Apparently that just was missed on the order. Nonetheless we will get that straightened out today. 8/9-Patient returns for bilateral foot wounds, using Prisma with hydrogel moistened dressings, and the wounds appear stable. Patient using surgical shoes, avoiding much pressure or weightbearing as much as possible 8/16; patient has bilateral foot  wounds. 1 on the right lateral foot proximally the other is on the left mid lateral foot. Both required debridement of callus and thick skin around the wounds. We have been using silver collagen 8/27; patient has bilateral lateral foot wounds. The area on the left substantially surrounded by callus and dry skin. This was removed from the wound edge. The underlying wound is small. The area on the right measured somewhat smaller today. 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 Unfortunately I did not update his antibiotics during today's visit. 9/10 I reviewed Dr. Hale Bogus last notes he felt he had completed antibiotics his inflammatory markers were reasonably well controlled. He has a small wound on the lateral left foot and a tiny area on the right which is just above closed. He is using Hydrofera Blue with border foam he has bilateral surgical shoes 9/24; 2 week f/u. doing well. right foot is closed. left foot still undermined. 10/14; right foot remains closed at the fifth met head. The area over the base of the left fifth metatarsal has a small open area but considerable undermining towards the plantar foot. Thick callus skin around this suggests an adequate pressure relief. We have talked about this. He says he is going to go back into his cam boot. I suggested a total contact cast he did not seem enamored with this suggestion 10/26; left foot base of the fifth metatarsal. Same condition as last time. He has skin over the area with an open wound however the skin is not adherent. He went to see Dr. Earleen Newport who did an x-ray and culture of his foot I have not reviewed the x-ray but the patient was not told anything. He is on doxycycline 11/11; since the patient was last here he was in the emergency room on 10/30 he was concerned about swelling in the left foot. They did not do any cultures or x-rays. They changed his antibiotics to  cephalexin. Previous culture showed group B strep. The cephalexin is appropriate as doxycycline has less than predictable coverage. Arrives in clinic today with swelling over this area under the wound. He also has a new wound on the right fifth metatarsal head 11/18; the patient has a difficult wound on the lateral aspect of the left fifth metatarsal head. The wound was almost ballotable last week I opened it slightly expecting to see purulence however there was just bleeding. I cultured this this was negative. X-ray unchanged. We are trying to get an  MRI but I am not sure were going to be able to get this through his insurance. He also has an area on the right lateral fifth metatarsal head this looks healthier 12/3; the patient finally got our MRI. Surprisingly this did not show osteomyelitis. I did show the soft tissue ulceration at the lateral plantar aspect of the fifth metatarsal base with a tiny residual 6 mm abscess overlying the superficial fascia I have tried to culture this area I have not been able to get this to grow anything. Nevertheless the protruding tissue looks aggravated. I suspect we should try to treat the underlying "abscess with broad-spectrum antibiotics. I am going to start him on Levaquin and Flagyl. He has much less edema in his legs and I am going to continue to wrap his legs and see him weekly 12/10. I started Levaquin and Flagyl on him last week. He just picked up the Flagyl apparently there was some delay. The worry is the wound on the left fifth metatarsal base which is substantial and worsening. His foot looks like he inverts at the ankle making this a weightbearing surface. Certainly no improvement in fact I think the measurements of this are somewhat worse. We have been using 12/17; he apparently just got the Levaquin yesterday this is 2 weeks after the fact. He has completed the Flagyl. The area over the left fifth metatarsal base still has protruding granulation  tissue although it does not look quite as bad as it did some weeks ago. He has severe bilateral lymphedema although we have not been treating him for wounds on his legs this is definitely going to require compression. There was so much edema in the left I did not wish to put him in a total contact cast today. I am going to increase his compression from 3-4 layer. The area on the right lateral fifth met head actually look quite good and superficial. 12/23; patient arrived with callus on the right fifth met head and the substantial hyper granulated callused wound on the base of his fifth metatarsal. He says he is completing his Levaquin in 2 days but I do not think that adds up with what I gave him but I will have to double check this. We are using Hydrofera Blue on both areas. My plan is to put the left leg in a cast the week after New Year's 04/06/2020; patient's wounds about the same. Right lateral fifth metatarsal head and left lateral foot over the base of the fifth metatarsal. There is undermining on the left lateral foot which I removed before application of total contact cast continuing with Hydrofera Blue new. Patient tells me he was seen by endocrinology today lab work was done [Dr. Kerr]. Also wondering whether he was referred to cardiology. I went over some lab work from previously does not have chronic renal failure certainly not nephrotic range proteinuria he does have very poorly controlled diabetes but this is not his most updated lab work. Hemoglobin A1c has been over 11 1/10; the patient had a considerable amount of leakage towards mid part of his left foot with macerated skin however the wound surface looks better the area on the right lateral fifth met head is better as well. I am going to change the dressing on the left foot under the total contact cast to silver alginate, continue with Hydrofera Blue on the right. 1/20; patient was in the total contact cast for 10 days. Considerable  amount of drainage although the skin  around the wound does not look too bad on the left foot. The area on the right fifth metatarsal head is closed. Our nursing staff reports large amount of drainage out of the left lateral foot wound 1/25; continues with copious amounts of drainage described by our intake staff. PCR culture I did last week showed E. coli and Enterococcus faecalis and low quantities. Multiple resistance genes documented including extended spectrum beta lactamase, MRSA, MRSE, quinolone, tetracycline. The wound is not quite as good this week as it was 5 days ago but about the same size 2/3; continues with copious amounts of malodorous drainage per our intake nurse. The PCR culture I did 2 weeks ago showed E. coli and low quantities of Enterococcus. There were multiple resistance genes detected. I put Neosporin on him last week although this does not seem to have helped. The wound is slightly deeper today. Offloading continues to be an issue here although with the amount of drainage she has a total contact cast is just not going to work 2/10; moderate amount of drainage. Patient reports he cannot get his stocking on over the dressing. I told him we have to do that the nurse gave him suggestions on how to make this work. The wound is on the bottom and lateral part of his left foot. Is cultured predominantly grew low amounts of Enterococcus, E. coli and anaerobes. There were multiple resistance genes detected including extended spectrum beta lactamase, quinolone, tetracycline. I could not think of an easy oral combination to address this so for now I am going to do topical antibiotics provided by [facility] I think the main agents here are vancomycin and an aminoglycoside. We have to be able to give him access to the wounds to get the topical antibiotic on 2/17; moderate amount of drainage this is unchanged. He has his Keystone topical antibiotic against the deep tissue culture organisms. He  has been using this and changing the dressing daily. Silver alginate on the wound surface. 2/24; using Keystone antibiotic with silver alginate on the top. He had too much drainage for a total contact cast at one point although I think that is improving and I think in the next week or 2 it might be possible to replace a total contact cast I did not do this today. In general the wound surface looks healthy however he continues to have thick rims of skin and subcutaneous tissue around the wide area of the circumference which I debrided 06/04/2020 upon evaluation today patient appears to be doing well in regard to his wound. I do feel like he is showing signs of improvement. There is little bit of callus and dead tissue around the edges of the wound as well as what appears to be a little bit of a sinus tract that is off to the side laterally I would perform debridement to clear that away today. 3/17; left lateral foot. The wound looks about the same as I remember. Not much depth surface looks healthy. No evidence of infection 3/25; left lateral foot. Wound surface looks about the same. Separating epithelium from the circumference. There really is no evidence of infection here however not making progress by my view 3/29; left lateral foot. Surface of the wound again looks reasonably healthy still thick skin and subcutaneous tissue around the wound margins. There is no evidence of infection. One of the concerns being brought up by the nurses has again the amount of drainage vis--vis continued use of a total contact cast 4/5;  left lateral foot at roughly the base of the fifth metatarsal. Nice healthy looking granulated tissue with rims of epithelialization. The overall wound measurements are not any better but the tissue looks healthy. The only concern is the amount of drainage although he has no surrounding maceration with what we have been doing recently to absorb fluid and protect his skin. He also has  lymphedema. 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 He states that the kerlix/coban under the TCC rolled down and could not get it back up. He has been using an offloading scooter and has somehow hurt his right foot using this device. This happened last week. He states that the side of his right foot developed a blister and opened. The top of his foot also has a few small open wounds he thinks is due to his socks rubbing in his shoes. He has not been using any dressings to the wound. He denies purulent drainage, fever/chills or erythema to the wounds. 4/22; patient presents for 1 week follow-up. He developed new wounds to the right foot that were evaluated at last clinic visit. He continues to have a total contact cast to the left leg and he reports no issues. He has been using silver collagen to the right foot wounds with no issues. He denies purulent drainage, fever/chills or erythema to the right foot wounds. He has no complaints today 4/25; patient presents for 1 week follow-up. He has a total contact cast of the left leg and reports no issues. He has been using silver alginate to the right foot wound. He denies purulent drainage, fever/chills or erythema to the right foot wounds. 5/2 patient presents for 1 week follow-up. T contact cast on the left. The wound which is on the base of the plantar foot at the base of the fifth metatarsal otal actually looks quite good and dimensions continue to gradually contract. HOWEVER the area on the right lateral fifth metatarsal head is much larger than what I remember from 2 weeks ago. Once more is he has significant levels of hypergranulation. Noteworthy that he had this same hyper granulated response on his  wound on the left foot at one point in time. So much so that he I thought there was an underlying fluid collection. Based on this I think this just needs debridement. 5/9; the wound on the left actually continues to be gradually smaller with a healthy surface. Slight amount of drainage and maceration of the skin around but not too bad. However he has a large wound over the right fifth metatarsal head very much in the same configuration as his left foot wound was initially. I used silver nitrate to address the hyper granulated tissue no mechanical debridement 5/16; area on the left foot did not look as healthy this week deeper thick surrounding macerated skin and subcutaneous tissue. The area on the right foot fifth met head was about the same The area on the right ankle that we identified last week is completely broken down into an open wound presumably a stocking rubbing issue 5/23; patient has been using a total contact cast to the left side. He has been using silver alginate underneath. He has also been using silver alginate to the right  foot wounds. He has no complaints today. He denies any signs of infection. 5/31; the left-sided wound looks some better measure smaller surface granulation looks better. We have been using silver alginate under the total contact cast The large area on his right fifth met head and right dorsal foot look about the same still using silver alginate 6/6; neither side is good as I was hoping although the surface area dimensions are better. A lot of maceration on his left and right foot around the wound edge. Area on the dorsal right foot looks better. He says he was traveling. I am not sure what does the amount of maceration around the plantar wounds may be drainage issues 6/13; in general the wound surfaces look quite good on both sides. Macerated skin and raised edges around the wound required debridement although in general especially on the left the surface area  seems improved. The area on the right dorsal ankle is about the same I thought this would not be such a problem to close 6/20; not much change in either wound although the one on the right looks a little better. Both wounds have thick macerated edges to the skin requiring debridements. We have been using silver alginate. The area on the dorsal right ankle is still open I thought this would be closed. 6/28; patient comes in today with a marked deterioration in the right foot wound fifth met head. Wide area of exposed bone this is a drastic change from last time. The area on the left there we have been casting is stagnant. We have been using silver alginate in both wound areas. 7/5; bone culture I did for PCR last time was positive for Pseudomonas, group B strep, Enterococcus and Staph aureus. There was no suggestion of methicillin resistance or ampicillin resistant genes. This was resistant to tetracycline however He comes into the clinic today with the area over his right plantar fifth metatarsal head which had been doing so well 2 weeks ago completely necrotic feeling bone. I do not know that this is going to be salvageable. The left foot wound is certainly no smaller but it has a better surface and is superficial. 7/8; patient called in this morning to say that his total contact cast was rubbing against his foot. He states he is doing fine overall. He denies signs of infection. 7/12; continued deterioration in the wound over the right fifth metatarsal head crumbling bone. This is not going to be salvageable. The patient agrees and wants to be referred to Dr. Doran Durand which we will attempt to arrange as soon as possible. I am going to continue him on antibiotics as long as that takes so I will renew those today. The area on the left foot which is the base of the fifth metatarsal continues to look somewhat better. Healthy looking tissue no depth no debridement is necessary here. 7/20; the patient was  kindly seen by Dr. Doran Durand of orthopedics on 10/19/2020. 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 Towards this end we have taken him out of the total contact cast on the left we will put him in a wrap with Hydrofera Blue. As I understand things surgery is planned for 7/21 7/27; patient had his surgery last Thursday. 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 Apparently everything went well we did not still disturb that today The area on the left foot  actually looks quite good. He has been much less mobile which probably explains this he did not seem to do well in the total contact cast secondary to drainage and maceration I think. 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 Fortunately there is no evidence of active infection at this time. No fevers, chills, nausea, vomiting, or diarrhea. Overall I think that he is actually doing extremely well. Nonetheless I do believe that he is staying off of this more following the surgery in his right foot that is the reason the left is doing so great. 8/16; left plantar foot wound. This looks smaller than the last time I saw this he is using Hydrofera Blue. The surgical wound on the right foot is being followed by Dr. Doran Durand we did not look at this today. He has surgical shoes on both feet 8/23; left plantar foot wound not as good this week. Surrounding macerated skin and subcutaneous tissue everything looks moist and wet. I do not think he is offloading this adequately. He is using a surgical shoe Apparently the right foot surgical wound is not open although I did not check his foot 8/31; left plantar foot lateral aspect. Much improved this week. He has no maceration. Some improvement in the surface area of the wound but most impressively the depth is come in we  are using silver alginate. The patient is a Product/process development scientist. He is asked that we write him a letter so he can go back to work. I have also tried to see if we can write something that will allow him to limit the amount of time that he is on his foot at work. 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 Hopefully the latter can be avoided 9/6; I believe the patient missed an appointment last week. He arrives in today with a wound looking roughly the same certainly no better. Undermining laterally and also inferiorly. We used molecuLight today in training with the patient's permission.. We are using silver alginate 9/21 wound is measuring bigger this week although this may have to do with the aggressive circumferential debridement last week in response to the blush fluorescence on the MolecuLight. Culture I did last week showed significant MSSA and E. coli. I put him on Augmentin but he has not started it yet. We are also going to send this for compounded antibiotics at [facility]. There is no evidence of systemic infection 9/29; silver alginate. His Keystone arrived. He is completing Augmentin in 2 days. Offloading in a cam boot. Moderate drainage per our intake staff 10/5; using silver alginate. He has been using his [place]. He has completed his Augmentin. Per our intake nurse still a lot of drainage, far too much to consider a total contact cast. Wound measures about the same. He had the same undermining area that I defined last week from a roughly 11-3. I remove this today 10/12; using silver alginate he is using the [place]. He comes in for a nurse visit hence we are applying [employer] twice a week. Measuring slightly better today and less notable drainage. Extensive debridement of the wound edge last time 10/18; using topical Keystone and silver alginate and a soft cast. Wound measurements about the same. Drainage  was through his soft cast. We are changing this twice a week  Tuesdays and Friday 10/25; comes in with moderate drainage. Still using Keystone silver alginate and a soft cast. Wound dimensions completely the same.He has a lot of edema in the left leg he has lymphedema. Asking for [name] to consider wrapping him as he cannot get his stocking on over the soft cast 11/2; comes in with moderate to large drainage slightly smaller in terms of width we have been using Cerulean. His wound looks satisfactory but not much improvement 11/4; patient presents today for obligatory cast change. Has no issues or complaints today. He denies signs of infection. 11/9; patient traveled this weekend to [place], was on the cast quite a bit. Staining of the cast with black material from his walking boot. Drainage was not quite as bad as we feared. Using silver alginate and [employer]) Signed: 02/10/2021 4:30:00 PM By: [name] MD Entered By: [name] on 02/10/2021 08:32:45 -------------------------------------------------------------------------------- Physical Exam Details Patient Name: Date of Service: [name], [name] 02/10/2021 7:30 A M Medical Record Number: [id number] Patient Account Number: [id number] Date of Birth/Sex: Treating RN: Jul 07, 1985 (35 y.o. [name] Primary Care Provider: [name] Other Clinician: Referring Provider: Treating Provider/Extender: [name] in Treatment: 15 Constitutional Patient is hypertensive.. Pulse regular and within target range for patient.Marland Kitchen Respirations regular, non-labored and within target range.. Temperature is normal and within the target range for the patient.Marland Kitchen Appears in no distress. Notes Wound exam left plantar foot granulation does not look too bad. I went over the surface with an open curette there is really no tactile gritty sensation. Minimal amounts of slough. I did not count this is a  debridement. There is a faint rim of epithelialization at the lateral aspect I think that was there last week. No evidence of infection surrounding skin looks satisfactory. No major changes in surface area however Electronic Signature(s) Signed: 02/10/2021 4:30:00 PM By: [name] MD Entered By: [name] on 02/10/2021 08:33:51 -------------------------------------------------------------------------------- Physician Orders Details Patient Name: Date of Service: [name], [name] 02/10/2021 7:30 A M Medical Record Number: [id number] Patient Account Number: [id number] Date of Birth/Sex: Treating RN: 05/05/1985 (35 y.o. [name] Primary Care Provider: [name] Other Clinician: Referring Provider: Treating Provider/Extender: [name] in Treatment: 22 Verbal / Phone Orders: No Diagnosis Coding ICD-10 Coding Code Description E11.621 Type 2 diabetes mellitus with foot ulcer L97.528 Non-pressure chronic ulcer of other part of left foot with other specified severity Follow-up Appointments ppointment in 1 week. - with Dr. Dellia Nims Return A Other: - MD visit Friday 11/11 for cast change Edema Control - Lymphedema / SCD / Other Bilateral Lower Extremities Elevate legs to the level of the heart or above for 30 minutes daily and/or when sitting, a frequency of: - throughout the day Avoid standing for long periods of time. Exercise regularly Moisturize legs daily. - right leg every night before bed. Compression stocking or Garment 20-30 mm/Hg pressure to: - Apply to right leg in the morning and remove at night. Off-Loading Total Contact Cast to Left Lower Extremity Other: - minimal weight bearing left foot Additional Orders / Instructions Follow Nutritious Diet Wound Treatment Wound #3 - Foot Wound Laterality: Left, Lateral Cleanser: Soap and Water 2 x Per Week/30 Days Discharge Instructions: May shower and wash wound with dial  antibacterial soap and water prior to dressing change. Cleanser: Wound Cleanser (Generic) 2 x Per Week/30 Days Discharge Instructions: Cleanse the wound with wound cleanser prior to applying a  clean dressing using gauze sponges, not tissue or cotton balls. Peri-Wound Care: Zinc Oxide Ointment 30g tube 2 x Per Week/30 Days Discharge Instructions: Apply Zinc Oxide to periwound with each dressing change as needed. Topical: keystone antibiotic compound 2 x Per Week/30 Days Discharge Instructions: thin layer to wound bed Prim Dressing: KerraCel Ag Gelling Fiber Dressing, 4x5 in (silver alginate) (Generic) 2 x Per Week/30 Days ary Discharge Instructions: Apply silver alginate to wound bed as instructed Secondary Dressing: ABD Pad, 8x10 2 x Per Week/30 Days Discharge Instructions: Apply over primary dressing as directed. Secondary Dressing: Zetuvit Plus 4x8 in (Generic) 2 x Per Week/30 Days Discharge Instructions: Apply over primary dressing as directed. Compression Wrap: Kerlix Roll 4.5x3.1 (in/yd) 2 x Per Week/30 Days Discharge Instructions: Apply Kerlix and Coban compression as directed. Compression Wrap: Coban Self-Adherent Wrap 4x5 (in/yd) 2 x Per Week/30 Days Discharge Instructions: Apply over Kerlix as directed. Electronic Signature(s) Signed: 02/10/2021 4:30:00 PM By: [name] MD Signed: 02/11/2021 5:34:00 PM By: [name] RN, BSN Entered By: [name] on 02/10/2021 08:24:39 -------------------------------------------------------------------------------- Problem List Details Patient Name: Date of Service: [name], [name] 02/10/2021 7:30 A M Medical Record Number: [id number] Patient Account Number: [id number] Date of Birth/Sex: Treating RN: 02/13/1986 (35 y.o. [name] Primary Care Provider: [name] Other Clinician: Referring Provider: Treating Provider/Extender: [name] in Treatment: 27 Active  Problems ICD-10 Encounter Code Description Active Date MDM Diagnosis E11.621 Type 2 diabetes mellitus with foot ulcer 10/24/2019 No Yes L97.528 Non-pressure chronic ulcer of other part of left foot with other specified 10/24/2019 No Yes severity Inactive Problems ICD-10 Code Description Active Date Inactive Date L97.518 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ity 10/24/2019 10/24/2019 L97.518 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ity 07/14/2020 07/14/2020 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, right ankle and foot 10/24/2019 10/24/2019 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 08/10/2020 08/10/2020 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, left ankle and foot 10/24/2019 10/24/2019 B95.62 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 10/24/2019 10/24/2019 classified elsewhere Resolved Problems Electronic Signature(s) Signed: 02/10/2021 4:30:00 PM By: [name] MD Entered By: [name] on 02/10/2021 08:30:43 -------------------------------------------------------------------------------- Progress Note Details Patient Name: Date of Service: [name], [name] 02/10/2021 7:30 A M Medical Record Number: [id number] Patient Account Number: [id number] Date of Birth/Sex: Treating RN: 1986/01/20 (35 y.o. 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 He comes in for review of a wound over the plantar fifth metatarsal head extending into the lateral part of the foot. He was followed for this previously by his podiatrist Dr. Cornelius Moras. As the patient tells his story he  went to see podiatry first for a swelling he developed on the lateral part of his fifth metatarsal head in May. He states this was "open" by podiatry and the area closed. He was followed up in June and it was again opened callus removed and it closed promptly. There were plans being made for surgery on the fifth metatarsal head in June however his blood sugar was apparently too high for anesthesia. Apparently the area was debrided and opened again in June and it is never closed since. Looking over the records from  podiatry I am really not able to follow this. It was clear when he was first seen it was before 5/14 at that point he already had a wound. By 5/17 the ulcer was resolved. I do not see anything about a procedure. On 5/28 noted to have pre-ulcerative moderate keratosis. X-ray noted 1/5 contracted toe and tailor's bunion and metatarsal deformity. On a visit date on 09/28/2018 the dorsal part of the left foot it healed and resolved. There was concern about swelling in his lower extremity he was sent to the ER.. As far as I can tell he was seen in the ER on 7/12 with an ulcer on his left foot. A DVT rule out of the left leg was negative. I do not think I have complete records from podiatry but I am not able to verify the procedures this patient states he had. He states after the last procedure the wound has never closed although I am not able to follow this in the records I have from podiatry. He has not had a recent x-ray The patient has been using Neosporin on the wound. He is wearing a Darco shoe. He is still very active up on his foot working and exercising. Past medical history; type 2 diabetes ketosis-prone, leg swelling with a negative DVT study in July. Non-smoker ABI in our clinic was 0.85 on the left 10/16; substantial wound on the plantar left fifth met head extending laterally almost to the dorsal fifth MTP. We have been using silver alginate we gave him a Darco forefoot off loader. An  x-ray did not show evidence of osteomyelitis did note soft tissue emphysema which I think was due to gas tracking through an open wound. There is no doubt in my mind he requires an MRI 10/23; MRI not booked until 3 November at the earliest this is largely due to his glucose sensor in the right arm. We have been using silver alginate. There has been an improvement 10/29; I am still not exactly sure when his MRI is booked for. He says it is the third but it is the 10th in epic. This definitely needs to be done. He is running a low-grade fever today but no other symptoms. No real improvement in the 1 02/26/2019 patient presents today for a follow-up visit here in our clinic he is last been seen in the clinic on October 29. Subsequently we were working on getting MRI to evaluate and see what exactly was going on and where we would need to go from the standpoint of whether or not he had osteomyelitis and again what treatments were going be required. Subsequently the patient ended up being admitted to the hospital on 02/07/2019 and was discharged on 02/14/2019. This is a somewhat interesting admission with a discharge diagnosis of pneumonia due to COVID-19 although he was positive for COVID-19 when tested at the urgent care but negative x2 when he was actually in the hospital. With that being said he did have acute respiratory failure with hypoxia and it was noted he also have a left foot ulceration with osteomyelitis. With that being said he did require oxygen for his pneumonia and I level 4 L. He was placed on antivirals and steroids for the COVID-19. He was also transferred to the [facility] at one point. Nonetheless he did subsequently discharged home and since being home has done much better in that regard. The CT angiogram did not show any pulmonary embolism. With regard to the osteomyelitis the patient  was placed on vancomycin and Zosyn while in the hospital but has been changed to Augmentin  at discharge. It was also recommended that he follow- up with wound care and podiatry. Podiatry however wanted him to see [name] according to the patient prior to them doing anything further. His hemoglobin A1c was 9.9 as noted in the hospital. Have an MRI of the left foot performed while in the hospital on 02/04/2019. 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 There is an overlying plantar open wound noted an abscess tracking back along the lateral aspect of the fifth metatarsal shaft. There is otherwise diffuse cellulitis and mild fasciitis without findings of polymyositis. The patient did have recently pneumonia secondary to COVID-19 I looked in the chart through epic and it does appear that the patient may need to have an additional x-ray just to ensure everything is cleared and that he has no airspace disease prior to putting him into the [name]. 03/05/2019; patient was readmitted to the clinic last week. He was hospitalized twice for a viral upper respiratory tract infection from 11/1 through 11/4 and then 11/5 through 11/12 ultimately this turned out to be Covid pneumonitis. Although he was discharged on oxygen he is not using it. He says he feels fine. He has no exercise limitation no cough no sputum. His O2 sat in our clinic today was 100% on room air. 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 He received Vanco and Zosyn in the hospital and then was discharged on 2 weeks of Augmentin. I do not see any relevant cultures. He was supposed to follow-up with infectious disease but I do not see that he has an appointment. 12/8; patient saw Dr. Novella Olive of infectious disease last week. He felt that he had had adequate antibiotic therapy. He did not go to follow-up with Dr. Amalia Hailey of podiatry and I  have again talked to him about the pros and cons of this. He does not want to consider a ray amputation of this time. He is aware of the risks of recurrence, migration etc. He started HBO today and tolerated this well. He can complete the Augmentin that I gave him last week. I have looked over the lab work that Dr. Chana Bode ordered his C-reactive protein was 3.3 and his sedimentation rate was 17. The C-reactive protein is never really been measurably that high in this patient 12/15; not much change in the wound today however he has undermining along the lateral part of the foot again more extensively than last week. He has some rims of epithelialization. We have been using silver alginate. He is undergoing hyperbarics but did not dive today 12/18; in for his obligatory first total contact cast change. Unfortunately there was pus coming from the undermining area around his fifth metatarsal head. This was cultured but will preclude reapplication of a cast. He is seen in conjunction with HBO 12/24; patient had staph lugdunensis in the wound in the undermining area laterally last time. We put him on doxycycline which should have covered this. The wound looks better today. I am going to give him another week of doxycycline before reattempting the total contact cast 12/31; the patient is completing antibiotics. Hemorrhagic debris in the distal part of the wound with some undermining distally. He also had hyper granulation. Extensive debridement with  a #5 curette. The infected area that was on the lateral part of the fifth met head is closed over. I do not think he needs any more antibiotics. Patient was seen prior to HBO. Preparations for a total contact cast were made in the cast will be placed post hyperbarics 04/11/19; once again the patient arrives today without complaint. He had been in a cast all week noted that he had heavy drainage this week. This resulted in large raised areas of macerated tissue  around the wound 1/14; wound bed looks better slightly smaller. Hydrofera Blue has been changing himself. He had a heavy drainage last week which caused a lot of maceration around the wound so I took him out of a total contact cast he says the drainage is actually better this week He is seen today in conjunction with HBO 1/21; returns to clinic. He was up in [place] for a day or 2 attending a funeral. He comes back in with the wound larger and with a large area of exposed bone. He had osteomyelitis and septic arthritis of the fifth left metatarsal head while he was in hospital. He received IV antibiotics in the hospital for a prolonged period of time then 3 weeks of Augmentin. Subsequently I gave him 2 weeks of doxycycline for more superficial wound infection. When I saw this last week the wound was smaller the surface of the wound looks satisfactory. 1/28; patient missed hyperbarics today. Bone biopsy I did last time showed Enterococcus faecalis and Staphylococcus lugdunensis . He has a wide area of exposed bone. We are going to use silver alginate as of today. I had another ethical discussion with the patient. 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 In this situation I think the likelihood of healing this is low. Therefore I have recommended a ray amputation and with the patient's agreement I have referred him to Dr. Doran Durand. The other issue is that his compliance with hyperbarics has been minimal because of his work schedule and given his underlying decision I am going to stop this today READMISSION 10/24/2019 MRI 09/29/2019 left foot IMPRESSION: 1. Apparent skin ulceration inferior and lateral to the 5th metatarsal base with underlying heterogeneous T2 signal and enhancement in the subcutaneous fat. Small peripherally enhancing fluid collections along the plantar and lateral aspects of the 5th metatarsal base suspicious for abscesses. 2. Interval  amputation through the mid 5th metatarsal with nonspecific low-level marrow edema and enhancement. Given the proximity to the adjacent soft tissue inflammatory changes, osteomyelitis cannot be excluded. 3. The additional bones appear unremarkable. MRI 09/29/2019 right foot IMPRESSION: 1. Soft tissue ulceration lateral to the 5th MTP joint. There is low-level T2 hyperintensity within the 4th and 5th metatarsal heads and adjacent proximal phalanges without abnormal T1 signal or cortical destruction. These findings are nonspecific and could be seen with early marrow edema, hyperemia or early osteomyelitis. No evidence of septic joint. 2. Mild tenosynovitis and synovial enhancement associated with the extensor digitorum tendons at the level of the midfoot. 3. Diffuse low-level muscular T2 hyperintensity and enhancement, most consistent with diabetic myopathy. LEFT FOOT BONE Methicillin resistant staphylococcus aureus Staphylococcus lugdunensis MIC MIC CIPROFLOXACIN >=8 RESISTANT Resistant <=0.5 SENSI... Sensitive CLINDAMYCIN <=0.25 SENS... Sensitive >=8 RESISTANT Resistant ERYTHROMYCIN >=8 RESISTANT Resistant >=8 RESISTANT Resistant GENTAMICIN <=0.5 SENSI... Sensitive <=0.5 SENSI... Sensitive Inducible Clindamycin NEGATIVE Sensitive NEGATIVE Sensitive OXACILLIN >=4 RESISTANT Resistant 2 SENSITIVE Sensitive RIFAMPIN <=0.5 SENSI... Sensitive <=0.5 SENSI... Sensitive TETRACYCLINE <=1 SENSITIVE Sensitive <=1 SENSITIVE Sensitive TRIMETH/SULFA <=  10 SENSIT Sensitive <=10 SENSIT Sensitive ... Marland Kitchen.. VANCOMYCIN 1 SENSITIVE Sensitive <=0.5 SENSI... Sensitive Right foot bone . Component 3 wk ago Specimen Description BONE Special Requests RIGHT 4 METATARSAL SAMPLE B Gram Stain NO WBC SEEN NO ORGANISMS SEEN Culture RARE METHICILLIN RESISTANT STAPHYLOCOCCUS AUREUS NO ANAEROBES ISOLATED Performed at [facility], [place] [address] [place], [place] [address] Report Status 10/08/2019  FINAL Organism ID, Bacteria METHICILLIN RESISTANT STAPHYLOCOCCUS AUREUS Resulting Agency [facility] Susceptibility Methicillin resistant staphylococcus aureus MIC CIPROFLOXACIN >=8 RESISTANT Resistant CLINDAMYCIN <=0.25 SENS... Sensitive ERYTHROMYCIN >=8 RESISTANT Resistant GENTAMICIN <=0.5 SENSI... Sensitive Inducible Clindamycin NEGATIVE Sensitive OXACILLIN >=4 RESISTANT Resistant RIFAMPIN <=0.5 SENSI... Sensitive TETRACYCLINE <=1 SENSITIVE Sensitive TRIMETH/SULFA <=10 SENSIT Sensitive ... VANCOMYCIN 1 SENSITIVE Sensitive This is a patient we had in clinic earlier this year with a wound over his left fifth metatarsal head. 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 Doran Durand on 05/16/2019. He seems to have developed problems with wounds on his bilateral feet in June 2021 although he may have had problems earlier than this. He was in an urgent care with a right foot ulcer on 09/26/2019 and given a course of doxycycline. This was apparently after having trouble getting into see orthopedics. He was seen by podiatry on 09/28/2019 noted to have bilateral lower extremity ulcers including the left lateral fifth metatarsal base and the right subfifth met head. It was noted that had purulent drainage at that time. He required hospitalization from 6/20 through 7/2. This was because of worsening right foot wounds. He underwent bilateral operative incision and drainage and bone biopsies bilaterally. Culture results are listed above. He has been referred back to clinic by Dr. Jacqualyn Posey of podiatry. He is also followed by Dr. Megan Salon who saw him yesterday. He was discharged from hospital on Zyvox Flagyl and Levaquin and yesterday  changed to doxycycline Flagyl and Levaquin. His inflammatory markers on 6/26 showed a sedimentation rate of 129 and a C-reactive protein of 5. This is improved to 14 and 1.3 respectively. This would indicate improvement. ABIs in our clinic today were 1.23 on the right and 1.20 on the left 11/01/2019 on evaluation today patient appears to be doing fairly well in regard to the wounds on his feet at this point. Fortunately there is no signs of active infection at this time. No fevers, chills, nausea, vomiting, or diarrhea. He currently is seeing infectious disease and still under their care at this point. Subsequently he also has both wounds which she has not been using collagen on as he did not receive that in his packaging he did not call us and let us know that. Apparently that just was missed on the order. Nonetheless we will get that straightened out today. 8/9-Patient returns for bilateral foot wounds, using Prisma with hydrogel moistened dressings, and the wounds appear stable. Patient using surgical shoes, avoiding much pressure or weightbearing as much as possible 8/16; patient has bilateral foot wounds. 1 on the right lateral foot proximally the other is on the left mid lateral foot. Both required debridement of callus and thick skin around the wounds. We have been using silver collagen 8/27; patient has bilateral lateral foot wounds. The area on the left substantially surrounded by callus and dry skin. This  was removed from the wound edge. The underlying wound is small. The area on the right measured somewhat smaller today. 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 Unfortunately I did not update his antibiotics during today's visit. 9/10 I reviewed Dr. Hale Bogus last notes he felt he had completed antibiotics his inflammatory markers were reasonably well controlled. He has a small wound on the lateral left foot and a tiny  area on the right which is just above closed. He is using Hydrofera Blue with border foam he has bilateral surgical shoes 9/24; 2 week f/u. doing well. right foot is closed. left foot still undermined. 10/14; right foot remains closed at the fifth met head. The area over the base of the left fifth metatarsal has a small open area but considerable undermining towards the plantar foot. Thick callus skin around this suggests an adequate pressure relief. We have talked about this. He says he is going to go back into his cam boot. I suggested a total contact cast he did not seem enamored with this suggestion 10/26; left foot base of the fifth metatarsal. Same condition as last time. He has skin over the area with an open wound however the skin is not adherent. He went to see Dr. Earleen Newport who did an x-ray and culture of his foot I have not reviewed the x-ray but the patient was not told anything. He is on doxycycline 11/11; since the patient was last here he was in the emergency room on 10/30 he was concerned about swelling in the left foot. They did not do any cultures or x-rays. They changed his antibiotics to cephalexin. Previous culture showed group B strep. The cephalexin is appropriate as doxycycline has less than predictable coverage. Arrives in clinic today with swelling over this area under the wound. He also has a new wound on the right fifth metatarsal head 11/18; the patient has a difficult wound on the lateral aspect of the left fifth metatarsal head. The wound was almost ballotable last week I opened it slightly expecting to see purulence however there was just bleeding. I cultured this this was negative. X-ray unchanged. We are trying to get an MRI but I am not sure were going to be able to get this through his insurance. He also has an area on the right lateral fifth metatarsal head this looks healthier 12/3; the patient finally got our MRI. Surprisingly this did not show osteomyelitis. I did  show the soft tissue ulceration at the lateral plantar aspect of the fifth metatarsal base with a tiny residual 6 mm abscess overlying the superficial fascia I have tried to culture this area I have not been able to get this to grow anything. Nevertheless the protruding tissue looks aggravated. I suspect we should try to treat the underlying "abscess with broad-spectrum antibiotics. I am going to start him on Levaquin and Flagyl. He has much less edema in his legs and I am going to continue to wrap his legs and see him weekly 12/10. I started Levaquin and Flagyl on him last week. He just picked up the Flagyl apparently there was some delay. The worry is the wound on the left fifth metatarsal base which is substantial and worsening. His foot looks like he inverts at the ankle making this a weightbearing surface. Certainly no improvement in fact I think the measurements of this are somewhat worse. We have been using 12/17; he apparently just got the Levaquin  yesterday this is 2 weeks after the fact. He has completed the Flagyl. The area over the left fifth metatarsal base still has protruding granulation tissue although it does not look quite as bad as it did some weeks ago. He has severe bilateral lymphedema although we have not been treating him for wounds on his legs this is definitely going to require compression. There was so much edema in the left I did not wish to put him in a total contact cast today. I am going to increase his compression from 3-4 layer. The area on the right lateral fifth met head actually look quite good and superficial. 12/23; patient arrived with callus on the right fifth met head and the substantial hyper granulated callused wound on the base of his fifth metatarsal. He says he is completing his Levaquin in 2 days but I do not think that adds up with what I gave him but I will have to double check this. We are using Hydrofera Blue on both areas. My plan is to put the left  leg in a cast the week after New Year's 04/06/2020; patient's wounds about the same. Right lateral fifth metatarsal head and left lateral foot over the base of the fifth metatarsal. There is undermining on the left lateral foot which I removed before application of total contact cast continuing with Hydrofera Blue new. Patient tells me he was seen by endocrinology today lab work was done [Dr. Kerr]. Also wondering whether he was referred to cardiology. I went over some lab work from previously does not have chronic renal failure certainly not nephrotic range proteinuria he does have very poorly controlled diabetes but this is not his most updated lab work. Hemoglobin A1c has been over 11 1/10; the patient had a considerable amount of leakage towards mid part of his left foot with macerated skin however the wound surface looks better the area on the right lateral fifth met head is better as well. I am going to change the dressing on the left foot under the total contact cast to silver alginate, continue with Hydrofera Blue on the right. 1/20; patient was in the total contact cast for 10 days. Considerable amount of drainage although the skin around the wound does not look too bad on the left foot. The area on the right fifth metatarsal head is closed. Our nursing staff reports large amount of drainage out of the left lateral foot wound 1/25; continues with copious amounts of drainage described by our intake staff. PCR culture I did last week showed E. coli and Enterococcus faecalis and low quantities. Multiple resistance genes documented including extended spectrum beta lactamase, MRSA, MRSE, quinolone, tetracycline. The wound is not quite as good this week as it was 5 days ago but about the same size 2/3; continues with copious amounts of malodorous drainage per our intake nurse. The PCR culture I did 2 weeks ago showed E. coli and low quantities of Enterococcus. There were multiple resistance genes  detected. I put Neosporin on him last week although this does not seem to have helped. The wound is slightly deeper today. Offloading continues to be an issue here although with the amount of drainage she has a total contact cast is just not going to work 2/10; moderate amount of drainage. Patient reports he cannot get his stocking on over the dressing. I told him we have to do that the nurse gave him suggestions on how to make this work. The wound is on the bottom  and lateral part of his left foot. Is cultured predominantly grew low amounts of Enterococcus, E. coli and anaerobes. There were multiple resistance genes detected including extended spectrum beta lactamase, quinolone, tetracycline. I could not think of an easy oral combination to address this so for now I am going to do topical antibiotics provided by [facility] I think the main agents here are vancomycin and an aminoglycoside. We have to be able to give him access to the wounds to get the topical antibiotic on 2/17; moderate amount of drainage this is unchanged. He has his Keystone topical antibiotic against the deep tissue culture organisms. He has been using this and changing the dressing daily. Silver alginate on the wound surface. 2/24; using Keystone antibiotic with silver alginate on the top. He had too much drainage for a total contact cast at one point although I think that is improving and I think in the next week or 2 it might be possible to replace a total contact cast I did not do this today. In general the wound surface looks healthy however he continues to have thick rims of skin and subcutaneous tissue around the wide area of the circumference which I debrided 06/04/2020 upon evaluation today patient appears to be doing well in regard to his wound. I do feel like he is showing signs of improvement. There is little bit of callus and dead tissue around the edges of the wound as well as what appears to be a little bit of a sinus  tract that is off to the side laterally I would perform debridement to clear that away today. 3/17; left lateral foot. The wound looks about the same as I remember. Not much depth surface looks healthy. No evidence of infection 3/25; left lateral foot. Wound surface looks about the same. Separating epithelium from the circumference. There really is no evidence of infection here however not making progress by my view 3/29; left lateral foot. Surface of the wound again looks reasonably healthy still thick skin and subcutaneous tissue around the wound margins. There is no evidence of infection. One of the concerns being brought up by the nurses has again the amount of drainage vis--vis continued use of a total contact cast 4/5; left lateral foot at roughly the base of the fifth metatarsal. Nice healthy looking granulated tissue with rims of epithelialization. The overall wound measurements are not any better but the tissue looks healthy. The only concern is the amount of drainage although he has no surrounding maceration with what we have been doing recently to absorb fluid and protect his skin. He also has lymphedema. 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 He states that the kerlix/coban under the TCC rolled down and could not get it back up. He has been using an offloading scooter and has somehow hurt his right foot using this device. This happened last week. He states that the side of his right foot developed a blister and opened. The top of his foot also has a few small open wounds he thinks is due to his socks rubbing in his shoes. He has not been using any dressings to the wound. He denies purulent drainage, fever/chills or erythema to the  wounds. 4/22; patient presents for  1 week follow-up. He developed new wounds to the right foot that were evaluated at last clinic visit. He continues to have a total contact cast to the left leg and he reports no issues. He has been using silver collagen to the right foot wounds with no issues. He denies purulent drainage, fever/chills or erythema to the right foot wounds. He has no complaints today 4/25; patient presents for 1 week follow-up. He has a total contact cast of the left leg and reports no issues. He has been using silver alginate to the right foot wound. He denies purulent drainage, fever/chills or erythema to the right foot wounds. 5/2 patient presents for 1 week follow-up. T contact cast on the left. The wound which is on the base of the plantar foot at the base of the fifth metatarsal otal actually looks quite good and dimensions continue to gradually contract. HOWEVER the area on the right lateral fifth metatarsal head is much larger than what I remember from 2 weeks ago. Once more is he has significant levels of hypergranulation. Noteworthy that he had this same hyper granulated response on his wound on the left foot at one point in time. So much so that he I thought there was an underlying fluid collection. Based on this I think this just needs debridement. 5/9; the wound on the left actually continues to be gradually smaller with a healthy surface. Slight amount of drainage and maceration of the skin around but not too bad. However he has a large wound over the right fifth metatarsal head very much in the same configuration as his left foot wound was initially. I used silver nitrate to address the hyper granulated tissue no mechanical debridement 5/16; area on the left foot did not look as healthy this week deeper thick surrounding macerated skin and subcutaneous tissue. oo The area on the right foot fifth met head was about the same oo The area on the right ankle that we  identified last week is completely broken down into an open wound presumably a stocking rubbing issue 5/23; patient has been using a total contact cast to the left side. He has been using silver alginate underneath. He has also been using silver alginate to the right foot wounds. He has no complaints today. He denies any signs of infection. 5/31; the left-sided wound looks some better measure smaller surface granulation looks better. We have been using silver alginate under the total contact cast oo The large area on his right fifth met head and right dorsal foot look about the same still using silver alginate 6/6; neither side is good as I was hoping although the surface area dimensions are better. A lot of maceration on his left and right foot around the wound edge. Area on the dorsal right foot looks better. He says he was traveling. I am not sure what does the amount of maceration around the plantar wounds may be drainage issues 6/13; in general the wound surfaces look quite good on both sides. Macerated skin and raised edges around the wound required debridement although in general especially on the left the surface area seems improved. oo The area on the right dorsal ankle is about the same I thought this would not be such a problem to close 6/20; not much change in either wound although the one on the right looks a little better. Both wounds have thick macerated edges to the skin requiring debridements. We have been using silver alginate. The area on the dorsal  right ankle is still open I thought this would be closed. 6/28; patient comes in today with a marked deterioration in the right foot wound fifth met head. Wide area of exposed bone this is a drastic change from last time. The area on the left there we have been casting is stagnant. We have been using silver alginate in both wound areas. 7/5; bone culture I did for PCR last time was positive for Pseudomonas, group B strep, Enterococcus  and Staph aureus. There was no suggestion of methicillin resistance or ampicillin resistant genes. This was resistant to tetracycline however He comes into the clinic today with the area over his right plantar fifth metatarsal head which had been doing so well 2 weeks ago completely necrotic feeling bone. I do not know that this is going to be salvageable. The left foot wound is certainly no smaller but it has a better surface and is superficial. 7/8; patient called in this morning to say that his total contact cast was rubbing against his foot. He states he is doing fine overall. He denies signs of infection. 7/12; continued deterioration in the wound over the right fifth metatarsal head crumbling bone. This is not going to be salvageable. The patient agrees and wants to be referred to Dr. Doran Durand which we will attempt to arrange as soon as possible. I am going to continue him on antibiotics as long as that takes so I will renew those today. The area on the left foot which is the base of the fifth metatarsal continues to look somewhat better. Healthy looking tissue no depth no debridement is necessary here. 7/20; the patient was kindly seen by Dr. Doran Durand of orthopedics on 10/19/2020. 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 Towards this end we have taken him out of the total contact cast on the left we will put him in a wrap with Hydrofera Blue. As I understand things surgery is planned for 7/21 7/27; patient had his surgery last Thursday. 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 Apparently everything went well we did not still disturb that today The area on the left foot actually looks quite good. He has been much less mobile which probably explains this he did not seem to do well in the total contact cast secondary to drainage and maceration I think. We have been using Hydrofera Blue 11/09/2020 upon  evaluation today patient appears to be doing well with regard to his plantar foot ulcer on the left foot. Fortunately there is no evidence of active infection at this time. No fevers, chills, nausea, vomiting, or diarrhea. Overall I think that he is actually doing extremely well. Nonetheless I do believe that he is staying off of this more following the surgery in his right foot that is the reason the left is doing so great. 8/16; left plantar foot wound. This looks smaller than the last time I saw this he is using Hydrofera Blue. The surgical wound on the right foot is being followed by Dr. Doran Durand we did not look at this today. He has surgical shoes on both feet 8/23; left plantar foot wound not as good this week. Surrounding macerated skin and subcutaneous tissue everything looks moist and wet. I do not think he is offloading this adequately. He is using a surgical shoe Apparently the right foot surgical wound is not open although I did not check  his foot 8/31; left plantar foot lateral aspect. Much improved this week. He has no maceration. Some improvement in the surface area of the wound but most impressively the depth is come in we are using silver alginate. The patient is a Product/process development scientist. He is asked that we write him a letter so he can go back to work. I have also tried to see if we can write something that will allow him to limit the amount of time that he is on his foot at work. 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 Hopefully the latter can be avoided 9/6; I believe the patient missed an appointment last week. He arrives in today with a wound looking roughly the same certainly no better. Undermining laterally and also inferiorly. We used molecuLight today in training with the patient's permission.. We are using silver alginate 9/21 wound is measuring bigger this week although this may have to do with  the aggressive circumferential debridement last week in response to the blush fluorescence on the MolecuLight. Culture I did last week showed significant MSSA and E. coli. I put him on Augmentin but he has not started it yet. We are also going to send this for compounded antibiotics at [facility]. There is no evidence of systemic infection 9/29; silver alginate. His Keystone arrived. He is completing Augmentin in 2 days. Offloading in a cam boot. Moderate drainage per our intake staff 10/5; using silver alginate. He has been using his [place]. He has completed his Augmentin. Per our intake nurse still a lot of drainage, far too much to consider a total contact cast. Wound measures about the same. He had the same undermining area that I defined last week from a roughly 11-3. I remove this today 10/12; using silver alginate he is using the [place]. He comes in for a nurse visit hence we are applying [employer] twice a week. Measuring slightly better today and less notable drainage. Extensive debridement of the wound edge last time 10/18; using topical Keystone and silver alginate and a soft cast. Wound measurements about the same. Drainage was through his soft cast. We are changing this twice a week Tuesdays and Friday 10/25; comes in with moderate drainage. Still using Keystone silver alginate and a soft cast. Wound dimensions completely the same.He has a lot of edema in the left leg he has lymphedema. Asking for [name] to consider wrapping him as he cannot get his stocking on over the soft cast 11/2; comes in with moderate to large drainage slightly smaller in terms of width we have been using [place]. His wound looks satisfactory but not much improvement 11/4; patient presents today for obligatory cast change. Has no issues or complaints today. He denies signs of infection. 11/9; patient traveled this weekend to [place], was on the cast quite a bit. Staining of the cast with black material from his walking  boot. Drainage was not quite as bad as we feared. Using silver alginate and Keystone Objective Constitutional Patient is hypertensive.. Pulse regular and within target range for patient.Marland Kitchen Respirations regular, non-labored and within target range.. Temperature is normal and within the target range for the patient.Marland Kitchen Appears in no distress. Vitals Time Taken: 7:53 AM, Height: 77 in, Weight: 280 lbs, BMI: 33.2, Temperature: 98.9 F, Pulse: 89 bpm, Respiratory Rate: 18 breaths/min, Blood Pressure: 152/99 mmHg, Capillary Blood Glucose: 82 mg/dl. General Notes: glucose per pt report General Notes: Wound  exam left plantar foot granulation does not look too bad. I went over the surface with an open curette there is really no tactile gritty sensation. Minimal amounts of slough. I did not count this is a debridement. There is a faint rim of epithelialization at the lateral aspect I think that was there last week. No evidence of infection surrounding skin looks satisfactory. No major changes in surface area however Integumentary (Hair, Skin) Wound #3 status is Open. Original cause of wound was Trauma. The date acquired was: 10/02/2019. The wound has been in treatment 67 weeks. The wound is located on the Left,Lateral Foot. The wound measures 3.4cm length x 3.2cm width x 0.7cm depth; 8.545cm^2 area and 5.982cm^3 volume. There is Fat Layer (Subcutaneous Tissue) exposed. There is no tunneling noted, however, there is undermining starting at 11:00 and ending at 4:00 with a maximum distance of 0.6cm. There is a large amount of serosanguineous drainage noted. The wound margin is epibole. There is large (67-100%) red, pink granulation within the wound bed. There is no necrotic tissue within the wound bed. 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 There was a T Contact Cast otal Procedure by [name], MD. Post procedure Diagnosis Wound #3: Same as Pre-Procedure Plan Follow-up Appointments: Return Appointment in 1 week. - with Dr. Dellia Nims Other: - MD visit Friday 11/11 for cast change Edema Control - Lymphedema / SCD / Other: Elevate legs to the level of the heart or above for 30 minutes daily and/or when sitting, a frequency of: - throughout the day Avoid standing for long periods of time. Exercise regularly Moisturize legs daily. - right leg every night before bed. Compression stocking or Garment 20-30 mm/Hg pressure to: - Apply to right leg in the morning and remove at night. Off-Loading: T Contact Cast to Left Lower Extremity otal Other: - minimal weight bearing left foot Additional Orders / Instructions: Follow Nutritious Diet WOUND #3: - Foot Wound Laterality: Left, Lateral Cleanser: Soap and Water 2 x Per Week/30 Days Discharge Instructions: May shower and wash wound with dial antibacterial soap and water prior to dressing change. Cleanser: Wound Cleanser (Generic) 2 x Per Week/30 Days Discharge Instructions: Cleanse the wound with wound cleanser prior to applying a clean dressing using gauze sponges, not tissue or cotton balls. Peri-Wound Care: Zinc Oxide Ointment 30g tube 2 x Per Week/30 Days Discharge Instructions: Apply Zinc Oxide to periwound with each dressing change as needed. Topical: keystone antibiotic compound 2 x Per Week/30 Days Discharge Instructions: thin layer to wound bed Prim Dressing: KerraCel Ag Gelling Fiber Dressing, 4x5 in (silver alginate) (Generic) 2 x Per Week/30 Days ary Discharge Instructions: Apply silver alginate to wound bed as instructed Secondary Dressing: ABD Pad, 8x10 2 x Per Week/30 Days Discharge Instructions: Apply over primary dressing as directed. Secondary Dressing: Zetuvit Plus  4x8 in (Generic) 2 x Per Week/30 Days Discharge Instructions: Apply over primary dressing as directed. Com pression Wrap: Kerlix Roll 4.5x3.1 (in/yd) 2 x Per Week/30 Days Discharge Instructions: Apply Kerlix and Coban compression as directed. Com pression Wrap: Coban Self-Adherent Wrap 4x5 (in/yd) 2 x Per Week/30 Days Discharge Instructions: Apply over Kerlix as directed. #1 I continued with the Keystone silver alginate as the primary dressing sit to fit gauze under a  total contact cast 2. Looking at the area on the bottom of his cast shows the positioning of the wound relative to how he walks. Clearly the pressure is all on the lateral part of his left foot 3. We are continuing to put 20/30 compression on the left leg because of edema Electronic Signature(s) Signed: 02/10/2021 4:30:00 PM By: [name] MD Signed: 02/11/2021 5:34:00 PM By: [name] RN, BSN Entered By: [name] on 02/10/2021 08:50:43 -------------------------------------------------------------------------------- Total Contact Cast Details Patient Name: Date of Service: [name], [name] 02/10/2021 7:30 A M Medical Record Number: [id number] Patient Account Number: [id number] Date of Birth/Sex: Treating RN: 06/01/1985 (35 y.o. [name] Primary Care Provider: [name] Other Clinician: Referring Provider: Treating Provider/Extender: [name] in Treatment: 43 T Contact Cast Applied for Wound Assessment: otal Wound #3 Left,Lateral Foot Performed By: Physician [name], MD Post Procedure Diagnosis Same as Pre-procedure Electronic Signature(s) Signed: 02/10/2021 4:30:00 PM By: [name] MD Signed: 02/11/2021 5:34:00 PM By: [name] RN, BSN Entered By: [name] on 02/10/2021 08:50:05 -------------------------------------------------------------------------------- Thermal Details Patient Name: Date of Service: [name], [name]  02/10/2021 Medical Record Number: [id number] Patient Account Number: [id number] Date of Birth/Sex: Treating RN: February 06, 1986 (35 y.o. [name] Primary Care Provider: [name] Other Clinician: Referring Provider: Treating Provider/Extender: [name] in Treatment: 67 Diagnosis Coding ICD-10 Codes Code Description E11.621 Type 2 diabetes mellitus with foot ulcer L97.528 Non-pressure chronic ulcer of other part of left foot with other specified severity Facility Procedures Physician Procedures : CPT4 Code Description Modifier [id number] - WC PHYS APPLY TOTAL CONTACT CAST ICD-10 Diagnosis Description E11.621 Type 2 diabetes mellitus with foot ulcer L97.528 Non-pressure chronic ulcer of other part of left foot with other specified severity Quantity: 1 Electronic Signature(s) Signed: 02/10/2021 4:30:00 PM By: [name] MD Entered By: [name] on 02/10/2021 08:36:13

## 2021-02-11 NOTE — Progress Notes (Signed)
[name] ([id number]) Visit Report for 02/10/2021 Arrival Information Details Patient Name: Date of Service: [name] D 02/10/2021 7:30 A M Medical Record Number: [id number] Patient Account Number: [id number] Date of Birth/Sex: Treating RN: [birth year] (35 y.o. [name], Briant Cedar Primary Care Tabbatha Bordelon: [name] Other Clinician: Referring Bryker Fletchall: Treating Kemontae Dunklee/Extender: [name] in Treatment: 37 Visit Information History Since Last Visit Added or deleted any medications: No Patient Arrived: Ambulatory Any new allergies or adverse reactions: No Arrival Time: 07:53 Had a fall or experienced change in No Accompanied By: self activities of daily living that may affect Transfer Assistance: None risk of falls: Patient Identification Verified: Yes Signs or symptoms of abuse/neglect since last visito No Secondary Verification Process Completed: Yes Hospitalized since last visit: No Patient Requires Transmission-Based Precautions: No Implantable device outside of the clinic excluding No Patient Has Alerts: No cellular tissue based products placed in the center since last visit: Has Dressing in Place as Prescribed: Yes Has Compression in Place as Prescribed: Yes Has Footwear/Offloading in Place as Prescribed: Yes Left: Multipodus Split/Boot Pain Present Now: No Electronic Signature(s) Signed: 02/10/2021 5:09:16 PM By: Dellie Catholic RN Entered By: Dellie Catholic on 02/10/2021 08:02:45 -------------------------------------------------------------------------------- Lower Extremity Assessment Details Patient Name: Date of Service: [name] D 02/10/2021 7:30 A M Medical Record Number: [id number] Patient Account Number: [id number] Date of Birth/Sex: Treating RN: 04-Jan-1986 (35 y.o. [name] Primary Care Hayzel Ruberg: [name] Other Clinician: Referring [name]: Treating Tremond Shimabukuro/Extender: [name] in Treatment: 67 Edema Assessment Assessed: [name]: No] [name]: No] Edema: [Left: Ye] [Right: s] Calf Left: Right: Point of Measurement: 48 cm From Medial Instep 48.3 cm Ankle Left: Right: Point of Measurement: 11 cm From Medial Instep 30.5 cm Vascular Assessment Pulses: Dorsalis Pedis Palpable: [Left:Yes] Electronic Signature(s) Signed: 02/10/2021 5:09:16 PM By: Dellie Catholic RN Signed: 02/11/2021 5:34:00 PM By: [name] RN, BSN Entered By: Dellie Catholic on 02/10/2021 08:15:15 -------------------------------------------------------------------------------- Multi Wound Chart Details Patient Name: Date of Service: [name], [name] 02/10/2021 7:30 A M Medical Record Number: [id number] Patient Account Number: [id number] Date of Birth/Sex: Treating RN: 03/21/1986 (35 y.o. [name] Primary Care Tyshon Fanning: [name] Other Clinician: Referring Felicia Both: Treating Therma [name]/Extender: [name] in Treatment: 67 Vital Signs Height(in): 77 Capillary Blood Glucose(mg/dl): 82 Weight(lbs): 280 Pulse(bpm): 59 Body Mass Index(BMI): 33 Blood Pressure(mmHg): 152/99 Temperature(F): 98.9 Respiratory Rate(breaths/min): 18 Photos: [N/A:N/A] Left, Lateral Foot N/A N/A Wound Location: Trauma N/A N/A Wounding Event: Diabetic Wound/Ulcer of the Lower N/A N/A Primary Etiology: Extremity Type II Diabetes N/A N/A Comorbid History: 10/02/2019 N/A N/A Date Acquired: 63 N/A N/A Weeks of Treatment: Open N/A N/A Wound Status: 3.4x3.2x0.7 N/A N/A Measurements L x W x D (cm) 8.545 N/A N/A A (cm) : rea 5.982 N/A N/A Volume (cm) : -418.20% N/A N/A % Reduction in A rea: -3525.50% N/A N/A % Reduction in Volume: 11 Starting Position 1 (o'clock): 4 Ending Position 1 (o'clock): 0.6 Maximum Distance 1 (cm): Yes N/A N/A Undermining: Grade 2 N/A N/A Classification: Large N/A N/A Exudate A mount: Serosanguineous N/A  N/A Exudate Type: red, brown N/A N/A Exudate Color: Epibole N/A N/A Wound Margin: Large (67-100%) N/A N/A Granulation A mount: Red, Pink N/A N/A Granulation Quality: None Present (0%) N/A N/A Necrotic A mount: Fat Layer (Subcutaneous Tissue): Yes N/A N/A Exposed Structures: Fascia: No Tendon: No Muscle: No Joint: No Bone: No Small (1-33%) N/A N/A Epithelialization: Treatment Notes Electronic Signature(s) Signed: 02/10/2021  4:30:00 PM By: Max Ham MD Signed: 02/11/2021 5:34:00 PM By: [name] RN, BSN Entered By: [name] on 02/10/2021 08:30:51 -------------------------------------------------------------------------------- Multi-Disciplinary Care Plan Details Patient Name: Date of Service: [name], [name] 02/10/2021 7:30 A M Medical Record Number: [id number] Patient Account Number: [id number] Date of Birth/Sex: Treating RN: [birth year] (35 y.o. [name] Primary Care Mashawn Brazil: [name] Other Clinician: Referring Runell Kovich: Treating Madellyn Denio/Extender: [name] in Treatment: 19 Multidisciplinary Care Plan reviewed with physician Active Inactive Nutrition Nursing Diagnoses: Imbalanced nutrition Potential for alteratiion in Nutrition/Potential for imbalanced nutrition Goals: Patient/caregiver agrees to and verbalizes understanding of need to use nutritional supplements and/or vitamins as prescribed Date Initiated: 10/24/2019 Date Inactivated: 04/06/2020 Target Resolution Date: 04/03/2020 Goal Status: Met Patient/caregiver will maintain therapeutic glucose control Date Initiated: 10/24/2019 Target Resolution Date: 03/02/2021 Goal Status: Active Interventions: Assess HgA1c results as ordered upon admission and as needed Assess patient nutrition upon admission and as needed per policy Provide education on elevated blood sugars and impact on wound healing Provide education on nutrition Treatment  Activities: Education provided on Nutrition : 11/24/2020 Notes: 11/17/20: Glucose control ongoing issue, target date extended. 01/26/21: Glucose management continues. Wound/Skin Impairment Nursing Diagnoses: Impaired tissue integrity Knowledge deficit related to ulceration/compromised skin integrity Goals: Patient/caregiver will verbalize understanding of skin care regimen Date Initiated: 10/24/2019 Target Resolution Date: 03/02/2021 Goal Status: Active Ulcer/skin breakdown will have a volume reduction of 30% by week 4 Date Initiated: 10/24/2019 Date Inactivated: 01/16/2020 Target Resolution Date: 01/10/2020 Unmet Reason: no change in Goal Status: Unmet measurements. Interventions: Assess patient/caregiver ability to obtain necessary supplies Assess patient/caregiver ability to perform ulcer/skin care regimen upon admission and as needed Assess ulceration(s) every visit Provide education on ulcer and skin care Notes: 11/17/20: Wound care regimen continues Electronic Signature(s) Signed: 02/11/2021 5:34:00 PM By: [name] RN, BSN Entered By: [name] on 02/10/2021 11:46:51 -------------------------------------------------------------------------------- Pain Assessment Details Patient Name: Date of Service: [name], [name] 02/10/2021 7:30 A M Medical Record Number: [id number] Patient Account Number: [id number] Date of Birth/Sex: Treating RN: 1986/01/16 (35 y.o. [name] Primary Care Crawford Tamura: [name] Other Clinician: Referring Kalab Camps: Treating Korrie Hofbauer/Extender: [name] in Treatment: 67 Active Problems Location of Pain Severity and Description of Pain Patient Has Paino No Site Locations Pain Management and Medication Current Pain Management: Electronic Signature(s) Signed: 02/10/2021 5:09:16 PM By: Dellie Catholic RN Signed: 02/11/2021 5:34:00 PM By: [name] RN, BSN Entered By: Dellie Catholic on 02/10/2021  08:11:40 -------------------------------------------------------------------------------- Patient/Caregiver Education Details Patient Name: Date of Service: [name], [name] 11/9/2022andnbsp7:30 A M Medical Record Number: [id number] Patient Account Number: [id number] Date of Birth/Gender: Treating RN: 1985/05/28 (35 y.o. [name] Primary Care Physician: [name] Other Clinician: Referring Physician: Treating Physician/Extender: [name] in Treatment: 88 Education Assessment Education Provided To: Patient Education Topics Provided Wound/Skin Impairment: Methods: Explain/Verbal Responses: State content correctly [employer]) Signed: 02/11/2021 5:34:00 PM By: [name] RN, BSN Entered By: [name] on 02/10/2021 11:47:38 -------------------------------------------------------------------------------- Wound Assessment Details Patient Name: Date of Service: [name], [name] 02/10/2021 7:30 A M Medical Record Number: [id number] Patient Account Number: [id number] Date of Birth/Sex: Treating RN: Feb 10, 1986 (35 y.o. [name] Primary Care Wyndell Cardiff: [name] Other Clinician: Referring Ayson Cherubini: Treating Edla Para/Extender: [name] in Treatment: 67 Wound Status Wound Number: 3 Primary Etiology: Diabetic Wound/Ulcer of the Lower Extremity Wound Location: Left, Lateral Foot Wound Status: Open Wounding Event: Trauma  Comorbid History: Type II Diabetes Date Acquired: 10/02/2019 Weeks Of Treatment: 67 Clustered Wound: No Photos Wound Measurements Length: (cm) 3.4 Width: (cm) 3.2 Depth: (cm) 0.7 Area: (cm) 8.545 Volume: (cm) 5.982 % Reduction in Area: -418.2% % Reduction in Volume: -3525.5% Epithelialization: Small (1-33%) Tunneling: No Undermining: Yes Starting Position (o'clock): 11 Ending Position (o'clock): 4 Maximum Distance: (cm) 0.6 Wound  Description Classification: Grade 2 Wound Margin: Epibole Exudate Amount: Large Exudate Type: Serosanguineous Exudate Color: red, brown Wound Bed Granulation Amount: Large (67-100%) Granulation Quality: Red, Pink Necrotic Amount: None Present (0%) Foul Odor After Cleansing: No Slough/Fibrino No Exposed Structure Fascia Exposed: No Fat Layer (Subcutaneous Tissue) Exposed: Yes Tendon Exposed: No Muscle Exposed: No Joint Exposed: No Bone Exposed: No Electronic Signature(s) Signed: 02/11/2021 5:34:00 PM By: [name] RN, BSN Entered By: [name] on 02/10/2021 08:17:57 -------------------------------------------------------------------------------- [place] Details Patient Name: Date of Service: [name], [name] 02/10/2021 7:30 A M Medical Record Number: [id number] Patient Account Number: [id number] Date of Birth/Sex: Treating RN: 1985/09/14 (35 y.o. [name] Primary Care Daden Mahany: [name] Other Clinician: Referring Tessah Patchen: Treating Detavious Rinn/Extender: [name] in Treatment: 67 Vital Signs Time Taken: 07:53 Temperature (F): 98.9 Height (in): 77 Pulse (bpm): 89 Weight (lbs): 280 Respiratory Rate (breaths/min): 18 Body Mass Index (BMI): 33.2 Blood Pressure (mmHg): 152/99 Capillary Blood Glucose (mg/dl): 82 Reference Range: 80 - 120 mg / dl Notes glucose per pt report Electronic Signature(s) Signed: 02/10/2021 5:09:16 PM By: Dellie Catholic RN Entered By: Dellie Catholic on 02/10/2021 08:03:10

## 2021-02-12 ENCOUNTER — Encounter (HOSPITAL_BASED_OUTPATIENT_CLINIC_OR_DEPARTMENT_OTHER): Payer: BC Managed Care – PPO | Admitting: Internal Medicine

## 2021-02-12 ENCOUNTER — Other Ambulatory Visit: Payer: Self-pay

## 2021-02-12 DIAGNOSIS — L97528 Non-pressure chronic ulcer of other part of left foot with other specified severity: Secondary | ICD-10-CM | POA: Diagnosis not present

## 2021-02-12 DIAGNOSIS — E11621 Type 2 diabetes mellitus with foot ulcer: Secondary | ICD-10-CM | POA: Diagnosis not present

## 2021-02-12 NOTE — Progress Notes (Signed)
[place], [name] ([id number]) Visit Report for 02/12/2021 Soft Cast Details Patient Name: Date of Service: [name] 02/12/2021 7:30 A M Medical Record Number: [id number] Patient Account Number: [id number] Date of Birth/Sex: Treating RN: 10-27-1985 (35 y.o. [name] Primary Care Provider: [name] Other Clinician: Referring Provider: Treating Provider/Extender: Dione Plover in Treatment: 83 Procedure Performed for: Wound #3 Left,Lateral Foot Performed By: Clinician Antonieta Iba, RN Electronic Signature(s) Signed: 02/12/2021 11:42:19 AM By: Geralyn Corwin DO Signed: 02/12/2021 12:16:14 PM By: Antonieta Iba Entered By: Antonieta Iba on 02/12/2021 08:34:34 -------------------------------------------------------------------------------- SuperBill Details Patient Name: Date of Service: [name], [name] 02/12/2021 Medical Record Number: [id number] Patient Account Number: [id number] Date of Birth/Sex: Treating RN: Nov 12, 1985 (35 y.o. 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

## 2021-02-12 NOTE — Progress Notes (Signed)
[place], [name] ([id number]) Visit Report for 02/12/2021 Arrival Information Details Patient Name: Date of Service: [name] 02/12/2021 7:30 A M Medical Record Number: [id number] Patient Account Number: [id number] Date of Birth/Sex: Treating RN: 1985/04/19 (35 y.o. [name] Primary Care French Kendra: [name] Other Clinician: Referring Prudy Candy: Treating Ollen Rao/Extender: Dione Plover in Treatment: 35 Visit Information History Since Last Visit Added or deleted any medications: No Patient Arrived: Ambulatory Any new allergies or adverse reactions: No Arrival Time: 08:00 Had a fall or experienced change in No Transfer Assistance: None activities of daily living that may affect Patient Requires Transmission-Based Precautions: No risk of falls: Patient Has Alerts: No Signs or symptoms of abuse/neglect since last visito No Hospitalized since last visit: No Implantable device outside of the clinic excluding No cellular tissue based products placed in the center since last visit: Has Dressing in Place as Prescribed: Yes Has Footwear/Offloading in Place as Prescribed: Yes Left: T Contact Cast otal Pain Present Now: No Electronic Signature(s) Signed: 02/12/2021 12:16:14 PM By: Antonieta Iba Entered By: Antonieta Iba on 02/12/2021 08:01:10 -------------------------------------------------------------------------------- Clinic Level of Care Assessment Details Patient Name: Date of Service: [name] 02/12/2021 7:30 A M Medical Record Number: [id number] Patient Account Number: [id number] Date of Birth/Sex: Treating RN: 1985-11-09 (35 y.o. [name] Primary Care Vieva Brummitt: [name] Other Clinician: Referring Roberta Kelly: Treating Jaykob Minichiello/Extender: Dione Plover in Treatment: 23 Clinic Level of Care Assessment Items TOOL 4 Quantity Score X- 1 0 Use when only an EandM is performed on FOLLOW-UP  visit ASSESSMENTS - Nursing Assessment / Reassessment X- 1 10 Reassessment of Co-morbidities (includes updates in patient status) X- 1 5 Reassessment of Adherence to Treatment Plan ASSESSMENTS - Wound and Skin A ssessment / Reassessment X - Simple Wound Assessment / Reassessment - one wound 1 5 []  - 0 Complex Wound Assessment / Reassessment - multiple wounds []  - 0 Dermatologic / Skin Assessment (not related to wound area) ASSESSMENTS - Focused Assessment []  - 0 Circumferential Edema Measurements - multi extremities []  - 0 Nutritional Assessment / Counseling / Intervention []  - 0 Lower Extremity Assessment (monofilament, tuning fork, pulses) []  - 0 Peripheral Arterial Disease Assessment (using hand held doppler) ASSESSMENTS - Ostomy and/or Continence Assessment and Care []  - 0 Incontinence Assessment and Management []  - 0 Ostomy Care Assessment and Management (repouching, etc.) PROCESS - Coordination of Care []  - 0 Simple Patient / Family Education for ongoing care X- 1 20 Complex (extensive) Patient / Family Education for ongoing care []  - 0 Staff obtains , Records, T Results / Process Orders est []  - 0 Staff telephones HHA, Nursing Homes / Clarify orders / etc []  - 0 Routine Transfer to another Facility (non-emergent condition) []  - 0 Routine Hospital Admission (non-emergent condition) []  - 0 New Admissions / / Ordering NPWT Apligraf, etc. , []  - 0 Emergency Hospital Admission (emergent condition) []  - 0 Simple Discharge Coordination []  - 0 Complex (extensive) Discharge Coordination PROCESS - Special Needs []  - 0 Pediatric / Minor Patient Management []  - 0 Isolation Patient Management []  - 0 Hearing / Language / Visual special needs []  - 0 Assessment of Community assistance (transportation, [name]/C planning, etc.) []  - 0 Additional assistance / Altered mentation []  - 0 Support Surface(s) Assessment (bed, cushion, seat,  etc.) INTERVENTIONS - Wound Cleansing / Measurement X - Simple Wound Cleansing - one wound 1 5 []  - 0 Complex Wound Cleansing - multiple wounds []  -  0 Wound Imaging (photographs - any number of wounds) []  - 0 Wound Tracing (instead of photographs) X- 1 5 Simple Wound Measurement - one wound []  - 0 Complex Wound Measurement - multiple wounds INTERVENTIONS - Wound Dressings []  - 0 Small Wound Dressing one or multiple wounds []  - 0 Medium Wound Dressing one or multiple wounds X- 1 20 Large Wound Dressing one or multiple wounds []  - 0 Application of Medications - topical []  - 0 Application of Medications - injection INTERVENTIONS - Miscellaneous []  - 0 External ear exam []  - 0 Specimen Collection (cultures, biopsies, blood, body fluids, etc.) []  - 0 Specimen(s) / Culture(s) sent or taken to Lab for analysis []  - 0 Patient Transfer (multiple staff / Civil Service fast streamer / Similar devices) []  - 0 Simple Staple / Suture removal (25 or less) []  - 0 Complex Staple / Suture removal (26 or more) []  - 0 Hypo / Hyperglycemic Management (close monitor of Blood Glucose) []  - 0 Ankle / Brachial Index (ABI) - do not check if billed separately X- 1 5 Vital Signs Has the patient been seen at the hospital within the last three years: Yes Total Score: 75 Level Of Care: New/Established - Level 2 Electronic Signature(s) Signed: 02/12/2021 12:16:14 PM By: [name] Entered By: [name] on 02/12/2021 08:36:00 -------------------------------------------------------------------------------- Encounter Discharge Information Details Patient Name: Date of Service: [name], [name] 02/12/2021 7:30 A M Medical Record Number: [id number] Patient Account Number: [id number] Date of Birth/Sex: Treating RN: 12-Jan-1986 (35 y.o. [name] Primary Care Juelle Dickmann: [name] Other Clinician: Referring Melesio Madara: Treating Morrison Mcbryar/Extender: [name] in  Treatment: 29 Encounter Discharge Information Items Discharge Condition: Stable Ambulatory Status: Ambulatory Discharge Destination: Home Transportation: Private Auto Schedule Follow-up Appointment: Yes Clinical Summary of Care: Provided on 02/12/2021 Form Type Recipient Paper Patient Patient Electronic Signature(s) Signed: 02/12/2021 12:16:14 PM By: [name] Entered By: [name] on 02/12/2021 08:35:26 -------------------------------------------------------------------------------- Patient/Caregiver Education Details Patient Name: Date of Service: [name], [name] 11/11/2022andnbsp7:30 A M Medical Record Number: [id number] Patient Account Number: [id number] Date of Birth/Gender: Treating RN: [birth year] (35 y.o. [name] Primary Care Physician: [name] Other Clinician: Referring Physician: Treating Physician/Extender: [name] in Treatment: 38 Education Assessment Education Provided To: Patient Education Topics Provided Offloading: Methods: Explain/Verbal Responses: State content correctly Wound/Skin Impairment: Methods: Explain/Verbal Responses: State content correctly Electronic Signature(s) Signed: 02/12/2021 12:16:14 PM By: [name] Entered By: [name] on 02/12/2021 08:35:08 -------------------------------------------------------------------------------- Wound Assessment Details Patient Name: Date of Service: [name], [name] 02/12/2021 7:30 A M Medical Record Number: [id number] Patient Account Number: [id number] Date of Birth/Sex: Treating RN: 19-Apr-1985 (35 y.o. [name] Primary Care Maryse Brierley: [name] Other Clinician: Referring Aleida Crandell: Treating Burley [name]/Extender: [name] in Treatment: 68 Wound Status Wound Number: 3 Primary Etiology: Diabetic Wound/Ulcer of the Lower Extremity Wound Location: Left, Lateral Foot Wound Status: Open Wounding  Event: Trauma Comorbid History: Type II Diabetes Date Acquired: 10/02/2019 Weeks Of Treatment: 68 Clustered Wound: No Wound Measurements Length: (cm) 3.4 Width: (cm) 3.2 Depth: (cm) 0.5 Area: (cm) 8.545 Volume: (cm) 4.273 % Reduction in Area: -418.2% % Reduction in Volume: -2489.7% Epithelialization: Small (1-33%) Tunneling: No Undermining: Yes Starting Position (o'clock): 7 Ending Position (o'clock): 12 Maximum Distance: (cm) 0.3 Wound Description Classification: Grade 2 Wound Margin: Epibole Exudate Amount: Large Exudate Type: Serosanguineous Exudate Color: red, brown Foul Odor After Cleansing: No Slough/Fibrino No Wound Bed Granulation Amount: Large (67-100%)  Exposed Structure Granulation Quality: Red, Pink Fascia Exposed: No Necrotic Amount: None Present (0%) Fat Layer (Subcutaneous Tissue) Exposed: Yes Tendon Exposed: No Muscle Exposed: No Joint Exposed: No Bone Exposed: No Assessment Notes Calloused Periwound Treatment Notes Wound #3 (Foot) Wound Laterality: Left, Lateral Cleanser Soap and Water Discharge Instruction: May shower and wash wound with dial antibacterial soap and water prior to dressing change. Wound Cleanser Discharge Instruction: Cleanse the wound with wound cleanser prior to applying a clean dressing using gauze sponges, not tissue or cotton balls. Peri-Wound Care Zinc Oxide Ointment 30g tube Discharge Instruction: Apply Zinc Oxide to periwound with each dressing change as needed. Topical keystone antibiotic compound Discharge Instruction: thin layer to wound bed Primary Dressing KerraCel Ag Gelling Fiber Dressing, 4x5 in (silver alginate) Discharge Instruction: Apply silver alginate to wound bed as instructed Secondary Dressing ABD Pad, 8x10 Discharge Instruction: Apply over primary dressing as directed. Zetuvit Plus 4x8 in Discharge Instruction: Apply over primary dressing as directed. Secured With Compression Wrap Kerlix Roll  4.5x3.1 (in/yd) Discharge Instruction: Apply Kerlix and Coban compression as directed. Coban Self-Adherent Wrap 4x5 (in/yd) Discharge Instruction: Apply over Kerlix as directed. Compression Stockings Add-Ons Electronic Signature(s) Signed: 02/12/2021 12:16:14 PM By: [name] Entered By: [name] on 02/12/2021 08:15:03 -------------------------------------------------------------------------------- Vitals Details Patient Name: Date of Service: [name], [name] 02/12/2021 7:30 A M Medical Record Number: [id number] Patient Account Number: [id number] Date of Birth/Sex: Treating RN: 1985/09/23 (35 y.o. [name] Primary Care Germaine Shenker: [name] Other Clinician: Referring Llesenia Fogal: Treating Shakeila Pfarr/Extender: [name] in Treatment: 76 Vital Signs Time Taken: 08:01 Temperature (F): 98.9 Height (in): 77 Pulse (bpm): 89 Weight (lbs): 280 Respiratory Rate (breaths/min): 16 Body Mass Index (BMI): 33.2 Blood Pressure (mmHg): 152/90 Capillary Blood Glucose (mg/dl): 130 Reference Range: 80 - 120 mg / dl Electronic Signature(s) Signed: 02/12/2021 12:16:14 PM By: [name] Entered By: [name] on 02/12/2021 08:01:59

## 2021-02-17 ENCOUNTER — Other Ambulatory Visit: Payer: Self-pay

## 2021-02-17 ENCOUNTER — Encounter (HOSPITAL_BASED_OUTPATIENT_CLINIC_OR_DEPARTMENT_OTHER): Payer: BC Managed Care – PPO | Admitting: Internal Medicine

## 2021-02-17 DIAGNOSIS — E11621 Type 2 diabetes mellitus with foot ulcer: Secondary | ICD-10-CM | POA: Diagnosis not present

## 2021-02-18 NOTE — Progress Notes (Signed)
[name] ([id number]) Visit Report for 02/17/2021 HPI Details Patient Name: Date of Service: [name] 02/17/2021 7:30 A M Medical Record Number: [id number] Patient Account Number: [id number] Date of Birth/Sex: Treating RN: June 13, 1985 (35 y.o. 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 He comes in for review of a wound over the plantar fifth metatarsal head extending into the lateral part of the foot. He was followed for this previously by his podiatrist Dr. Cornelius Moras. As the patient tells his story he went to see podiatry first for a swelling he developed on the lateral part of his fifth metatarsal head in May. He states this was "open" by podiatry and the area closed. He was followed up in June and it was again opened callus removed and it closed promptly. There were plans being made for surgery on the fifth metatarsal head in June however his blood sugar was apparently too high for anesthesia. Apparently the area was debrided and opened again in June and it is never closed since. Looking over the records from podiatry I am really not able to follow this. It was clear when he was first seen it was before 5/14 at that point he already had a wound. By 5/17 the ulcer was resolved. I do not see anything about a procedure. On 5/28 noted to have pre-ulcerative moderate keratosis. X-ray noted 1/5 contracted toe and tailor's bunion and metatarsal deformity. On a visit date on 09/28/2018 the dorsal part of the left foot it healed and resolved. There was concern about swelling in his lower extremity he was sent to the ER.. As far as I can tell he was seen in the ER on 7/12 with an ulcer on his left foot. A DVT rule  out of the left leg was negative. I do not think I have complete records from podiatry but I am not able to verify the procedures this patient states he had. He states after the last procedure the wound has never closed although I am not able to follow this in the records I have from podiatry. He has not had a recent x-ray The patient has been using Neosporin on the wound. He is wearing a Darco shoe. He is still very active up on his foot working and exercising. Past medical history; type 2 diabetes ketosis-prone, leg swelling with a negative DVT study in July. Non-smoker ABI in our clinic was 0.85 on the left 10/16; substantial wound on the plantar left fifth met head extending laterally almost to the dorsal fifth MTP. We have been using silver alginate we gave him a Darco forefoot off loader. An x-ray did not show evidence of osteomyelitis did note soft tissue emphysema which I think was due to gas tracking through an open wound. There is no doubt in my mind he requires an MRI 10/23; MRI not booked until 3 November at the earliest this is largely due to his glucose sensor in the right arm. We have been using silver alginate. There has been an improvement 10/29; I am still not exactly sure when his MRI is booked for. He says it is the third but it is the 10th in epic. This definitely needs to be done. He is running a low-grade fever today but no other symptoms. No real  improvement in the 1 02/26/2019 patient presents today for a follow-up visit here in our clinic he is last been seen in the clinic on October 29. Subsequently we were working on getting MRI to evaluate and see what exactly was going on and where we would need to go from the standpoint of whether or not he had osteomyelitis and again what treatments were going be required. Subsequently the patient ended up being admitted to the hospital on 02/07/2019 and was discharged on 02/14/2019. This is a somewhat interesting admission with a  discharge diagnosis of pneumonia due to COVID-19 although he was positive for COVID-19 when tested at the urgent care but negative x2 when he was actually in the hospital. With that being said he did have acute respiratory failure with hypoxia and it was noted he also have a left foot ulceration with osteomyelitis. With that being said he did require oxygen for his pneumonia and I level 4 L. He was placed on antivirals and steroids for the COVID-19. He was also transferred to the [facility] at one point. Nonetheless he did subsequently discharged home and since being home has done much better in that regard. The CT angiogram did not show any pulmonary embolism. With regard to the osteomyelitis the patient was placed on vancomycin and Zosyn while in the hospital but has been changed to Augmentin at discharge. It was also recommended that he follow- up with wound care and podiatry. Podiatry however wanted him to see [name] according to the patient prior to them doing anything further. His hemoglobin A1c was 9.9 as noted in the hospital. Have an MRI of the left foot performed while in the hospital on 02/04/2019. 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 There is an overlying plantar open wound noted an abscess tracking back along the lateral aspect of the fifth metatarsal shaft. There is otherwise diffuse cellulitis and mild fasciitis without findings of polymyositis. The patient did have recently pneumonia secondary to COVID-19 I looked in the chart through epic and it does appear that the patient may need to have an additional x-ray just to ensure everything is cleared and that he has no airspace disease prior to putting him into the [name]. 03/05/2019; patient was readmitted to the clinic last week. He was hospitalized twice for a viral upper respiratory tract infection from 11/1 through 11/4 and  then 11/5 through 11/12 ultimately this turned out to be Covid pneumonitis. Although he was discharged on oxygen he is not using it. He says he feels fine. He has no exercise limitation no cough no sputum. His O2 sat in our clinic today was 100% on room air. 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 He received Vanco and Zosyn in the hospital and then was discharged on 2 weeks of Augmentin. I do not see any relevant cultures. He was supposed to follow-up with infectious disease but I do not see that he has an appointment. 12/8; patient saw Dr. Novella Olive of infectious disease last week. He felt that he had had adequate antibiotic therapy. He did not go to follow-up with Dr. Amalia Hailey of podiatry and I have again talked to him about the pros and cons of this. He does not want to consider a ray amputation of this time. He is aware of the risks  of recurrence, migration etc. He started HBO today and tolerated this well. He can complete the Augmentin that I gave him last week. I have looked over the lab work that Dr. Chana Bode ordered his C-reactive protein was 3.3 and his sedimentation rate was 17. The C-reactive protein is never really been measurably that high in this patient 12/15; not much change in the wound today however he has undermining along the lateral part of the foot again more extensively than last week. He has some rims of epithelialization. We have been using silver alginate. He is undergoing hyperbarics but did not dive today 12/18; in for his obligatory first total contact cast change. Unfortunately there was pus coming from the undermining area around his fifth metatarsal head. This was cultured but will preclude reapplication of a cast. He is seen in conjunction with HBO 12/24; patient had staph lugdunensis in the wound in the undermining area laterally last time. We put him on  doxycycline which should have covered this. The wound looks better today. I am going to give him another week of doxycycline before reattempting the total contact cast 12/31; the patient is completing antibiotics. Hemorrhagic debris in the distal part of the wound with some undermining distally. He also had hyper granulation. Extensive debridement with a #5 curette. The infected area that was on the lateral part of the fifth met head is closed over. I do not think he needs any more antibiotics. Patient was seen prior to HBO. Preparations for a total contact cast were made in the cast will be placed post hyperbarics 04/11/19; once again the patient arrives today without complaint. He had been in a cast all week noted that he had heavy drainage this week. This resulted in large raised areas of macerated tissue around the wound 1/14; wound bed looks better slightly smaller. Hydrofera Blue has been changing himself. He had a heavy drainage last week which caused a lot of maceration around the wound so I took him out of a total contact cast he says the drainage is actually better this week He is seen today in conjunction with HBO 1/21; returns to clinic. He was up in [place] for a day or 2 attending a funeral. He comes back in with the wound larger and with a large area of exposed bone. He had osteomyelitis and septic arthritis of the fifth left metatarsal head while he was in hospital. He received IV antibiotics in the hospital for a prolonged period of time then 3 weeks of Augmentin. Subsequently I gave him 2 weeks of doxycycline for more superficial wound infection. When I saw this last week the wound was smaller the surface of the wound looks satisfactory. 1/28; patient missed hyperbarics today. Bone biopsy I did last time showed Enterococcus faecalis and Staphylococcus lugdunensis . He has a wide area of exposed bone. We are going to use silver alginate as of today. I had another ethical discussion  with the patient. 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 In this situation I think the likelihood of healing this is low. Therefore I have recommended a ray amputation and with the patient's agreement I have referred him to Dr. Doran Durand. The other issue is that his compliance with hyperbarics has been minimal because of his work schedule and given his underlying decision I am going to stop this today READMISSION 10/24/2019 MRI 09/29/2019 left foot IMPRESSION: 1. Apparent skin ulceration inferior and lateral to the 5th metatarsal base with underlying heterogeneous T2  signal and enhancement in the subcutaneous fat. Small peripherally enhancing fluid collections along the plantar and lateral aspects of the 5th metatarsal base suspicious for abscesses. 2. Interval amputation through the mid 5th metatarsal with nonspecific low-level marrow edema and enhancement. Given the proximity to the adjacent soft tissue inflammatory changes, osteomyelitis cannot be excluded. 3. The additional bones appear unremarkable. MRI 09/29/2019 right foot IMPRESSION: 1. Soft tissue ulceration lateral to the 5th MTP joint. There is low-level T2 hyperintensity within the 4th and 5th metatarsal heads and adjacent proximal phalanges without abnormal T1 signal or cortical destruction. These findings are nonspecific and could be seen with early marrow edema, hyperemia or early osteomyelitis. No evidence of septic joint. 2. Mild tenosynovitis and synovial enhancement associated with the extensor digitorum tendons at the level of the midfoot. 3. Diffuse low-level muscular T2 hyperintensity and enhancement, most consistent with diabetic myopathy. LEFT FOOT BONE Methicillin resistant staphylococcus aureus Staphylococcus lugdunensis MIC MIC CIPROFLOXACIN >=8 RESISTANT Resistant <=0.5 SENSI... Sensitive CLINDAMYCIN <=0.25 SENS... Sensitive >=8 RESISTANT Resistant ERYTHROMYCIN >=8  RESISTANT Resistant >=8 RESISTANT Resistant GENTAMICIN <=0.5 SENSI... Sensitive <=0.5 SENSI... Sensitive Inducible Clindamycin NEGATIVE Sensitive NEGATIVE Sensitive OXACILLIN >=4 RESISTANT Resistant 2 SENSITIVE Sensitive RIFAMPIN <=0.5 SENSI... Sensitive <=0.5 SENSI... Sensitive TETRACYCLINE <=1 SENSITIVE Sensitive <=1 SENSITIVE Sensitive TRIMETH/SULFA <=10 SENSIT Sensitive <=10 SENSIT Sensitive ... Marland Kitchen.. VANCOMYCIN 1 SENSITIVE Sensitive <=0.5 SENSI... Sensitive Right foot bone . Component 3 wk ago Specimen Description BONE Special Requests RIGHT 4 METATARSAL SAMPLE B Gram Stain NO WBC SEEN NO ORGANISMS SEEN Culture RARE METHICILLIN RESISTANT STAPHYLOCOCCUS AUREUS NO ANAEROBES ISOLATED Performed at [facility], [place] [address], [place], [place] [address] Report Status 10/08/2019 FINAL Organism ID, Bacteria METHICILLIN RESISTANT STAPHYLOCOCCUS AUREUS Resulting Agency [facility] Susceptibility Methicillin resistant staphylococcus aureus MIC CIPROFLOXACIN >=8 RESISTANT Resistant CLINDAMYCIN <=0.25 SENS... Sensitive ERYTHROMYCIN >=8 RESISTANT Resistant GENTAMICIN <=0.5 SENSI... Sensitive Inducible Clindamycin NEGATIVE Sensitive OXACILLIN >=4 RESISTANT Resistant RIFAMPIN <=0.5 SENSI... Sensitive TETRACYCLINE <=1 SENSITIVE Sensitive TRIMETH/SULFA <=10 SENSIT Sensitive ... VANCOMYCIN 1 SENSITIVE Sensitive This is a patient we had in clinic earlier this year with a wound over his left fifth metatarsal head. 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 Doran Durand on 05/16/2019. He seems to have developed problems with wounds on his bilateral feet in June 2021 although he may have had problems earlier than  this. He was in an urgent care with a right foot ulcer on 09/26/2019 and given a course of doxycycline. This was apparently after having trouble getting into see orthopedics. He was seen by podiatry on 09/28/2019 noted to have bilateral lower extremity ulcers including the left lateral fifth metatarsal base and the right subfifth met head. It was noted that had purulent drainage at that time. He required hospitalization from 6/20 through 7/2. This was because of worsening right foot wounds. He underwent bilateral operative incision and drainage and bone biopsies bilaterally. Culture results are listed above. He has been referred back to clinic by Dr. Jacqualyn Posey of podiatry. He is also followed by Dr. Megan Salon who saw him yesterday. He was discharged from hospital on Zyvox Flagyl and Levaquin and yesterday changed to doxycycline Flagyl and Levaquin. His inflammatory markers on 6/26 showed a sedimentation rate of 129 and a C-reactive protein of 5. This  is improved to 14 and 1.3 respectively. This would indicate improvement. ABIs in our clinic today were 1.23 on the right and 1.20 on the left 11/01/2019 on evaluation today patient appears to be doing fairly well in regard to the wounds on his feet at this point. Fortunately there is no signs of active infection at this time. No fevers, chills, nausea, vomiting, or diarrhea. He currently is seeing infectious disease and still under their care at this point. Subsequently he also has both wounds which she has not been using collagen on as he did not receive that in his packaging he did not call us and let us know that. Apparently that just was missed on the order. Nonetheless we will get that straightened out today. 8/9-Patient returns for bilateral foot wounds, using Prisma with hydrogel moistened dressings, and the wounds appear stable. Patient using surgical shoes, avoiding much pressure or weightbearing as much as possible 8/16; patient has bilateral foot  wounds. 1 on the right lateral foot proximally the other is on the left mid lateral foot. Both required debridement of callus and thick skin around the wounds. We have been using silver collagen 8/27; patient has bilateral lateral foot wounds. The area on the left substantially surrounded by callus and dry skin. This was removed from the wound edge. The underlying wound is small. The area on the right measured somewhat smaller today. 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 Unfortunately I did not update his antibiotics during today's visit. 9/10 I reviewed Dr. Hale Bogus last notes he felt he had completed antibiotics his inflammatory markers were reasonably well controlled. He has a small wound on the lateral left foot and a tiny area on the right which is just above closed. He is using Hydrofera Blue with border foam he has bilateral surgical shoes 9/24; 2 week f/u. doing well. right foot is closed. left foot still undermined. 10/14; right foot remains closed at the fifth met head. The area over the base of the left fifth metatarsal has a small open area but considerable undermining towards the plantar foot. Thick callus skin around this suggests an adequate pressure relief. We have talked about this. He says he is going to go back into his cam boot. I suggested a total contact cast he did not seem enamored with this suggestion 10/26; left foot base of the fifth metatarsal. Same condition as last time. He has skin over the area with an open wound however the skin is not adherent. He went to see Dr. Earleen Newport who did an x-ray and culture of his foot I have not reviewed the x-ray but the patient was not told anything. He is on doxycycline 11/11; since the patient was last here he was in the emergency room on 10/30 he was concerned about swelling in the left foot. They did not do any cultures or x-rays. They changed his antibiotics to  cephalexin. Previous culture showed group B strep. The cephalexin is appropriate as doxycycline has less than predictable coverage. Arrives in clinic today with swelling over this area under the wound. He also has a new wound on the right fifth metatarsal head 11/18; the patient has a difficult wound on the lateral aspect of the left fifth metatarsal head. The wound was almost ballotable last week I opened it slightly expecting to see purulence however there was just bleeding. I cultured this this was negative. X-ray unchanged. We are trying to get an  MRI but I am not sure were going to be able to get this through his insurance. He also has an area on the right lateral fifth metatarsal head this looks healthier 12/3; the patient finally got our MRI. Surprisingly this did not show osteomyelitis. I did show the soft tissue ulceration at the lateral plantar aspect of the fifth metatarsal base with a tiny residual 6 mm abscess overlying the superficial fascia I have tried to culture this area I have not been able to get this to grow anything. Nevertheless the protruding tissue looks aggravated. I suspect we should try to treat the underlying "abscess with broad-spectrum antibiotics. I am going to start him on Levaquin and Flagyl. He has much less edema in his legs and I am going to continue to wrap his legs and see him weekly 12/10. I started Levaquin and Flagyl on him last week. He just picked up the Flagyl apparently there was some delay. The worry is the wound on the left fifth metatarsal base which is substantial and worsening. His foot looks like he inverts at the ankle making this a weightbearing surface. Certainly no improvement in fact I think the measurements of this are somewhat worse. We have been using 12/17; he apparently just got the Levaquin yesterday this is 2 weeks after the fact. He has completed the Flagyl. The area over the left fifth metatarsal base still has protruding granulation  tissue although it does not look quite as bad as it did some weeks ago. He has severe bilateral lymphedema although we have not been treating him for wounds on his legs this is definitely going to require compression. There was so much edema in the left I did not wish to put him in a total contact cast today. I am going to increase his compression from 3-4 layer. The area on the right lateral fifth met head actually look quite good and superficial. 12/23; patient arrived with callus on the right fifth met head and the substantial hyper granulated callused wound on the base of his fifth metatarsal. He says he is completing his Levaquin in 2 days but I do not think that adds up with what I gave him but I will have to double check this. We are using Hydrofera Blue on both areas. My plan is to put the left leg in a cast the week after New Year's 04/06/2020; patient's wounds about the same. Right lateral fifth metatarsal head and left lateral foot over the base of the fifth metatarsal. There is undermining on the left lateral foot which I removed before application of total contact cast continuing with Hydrofera Blue new. Patient tells me he was seen by endocrinology today lab work was done [Dr. Kerr]. Also wondering whether he was referred to cardiology. I went over some lab work from previously does not have chronic renal failure certainly not nephrotic range proteinuria he does have very poorly controlled diabetes but this is not his most updated lab work. Hemoglobin A1c has been over 11 1/10; the patient had a considerable amount of leakage towards mid part of his left foot with macerated skin however the wound surface looks better the area on the right lateral fifth met head is better as well. I am going to change the dressing on the left foot under the total contact cast to silver alginate, continue with Hydrofera Blue on the right. 1/20; patient was in the total contact cast for 10 days. Considerable  amount of drainage although the skin  around the wound does not look too bad on the left foot. The area on the right fifth metatarsal head is closed. Our nursing staff reports large amount of drainage out of the left lateral foot wound 1/25; continues with copious amounts of drainage described by our intake staff. PCR culture I did last week showed E. coli and Enterococcus faecalis and low quantities. Multiple resistance genes documented including extended spectrum beta lactamase, MRSA, MRSE, quinolone, tetracycline. The wound is not quite as good this week as it was 5 days ago but about the same size 2/3; continues with copious amounts of malodorous drainage per our intake nurse. The PCR culture I did 2 weeks ago showed E. coli and low quantities of Enterococcus. There were multiple resistance genes detected. I put Neosporin on him last week although this does not seem to have helped. The wound is slightly deeper today. Offloading continues to be an issue here although with the amount of drainage she has a total contact cast is just not going to work 2/10; moderate amount of drainage. Patient reports he cannot get his stocking on over the dressing. I told him we have to do that the nurse gave him suggestions on how to make this work. The wound is on the bottom and lateral part of his left foot. Is cultured predominantly grew low amounts of Enterococcus, E. coli and anaerobes. There were multiple resistance genes detected including extended spectrum beta lactamase, quinolone, tetracycline. I could not think of an easy oral combination to address this so for now I am going to do topical antibiotics provided by [facility] I think the main agents here are vancomycin and an aminoglycoside. We have to be able to give him access to the wounds to get the topical antibiotic on 2/17; moderate amount of drainage this is unchanged. He has his Keystone topical antibiotic against the deep tissue culture organisms. He  has been using this and changing the dressing daily. Silver alginate on the wound surface. 2/24; using Keystone antibiotic with silver alginate on the top. He had too much drainage for a total contact cast at one point although I think that is improving and I think in the next week or 2 it might be possible to replace a total contact cast I did not do this today. In general the wound surface looks healthy however he continues to have thick rims of skin and subcutaneous tissue around the wide area of the circumference which I debrided 06/04/2020 upon evaluation today patient appears to be doing well in regard to his wound. I do feel like he is showing signs of improvement. There is little bit of callus and dead tissue around the edges of the wound as well as what appears to be a little bit of a sinus tract that is off to the side laterally I would perform debridement to clear that away today. 3/17; left lateral foot. The wound looks about the same as I remember. Not much depth surface looks healthy. No evidence of infection 3/25; left lateral foot. Wound surface looks about the same. Separating epithelium from the circumference. There really is no evidence of infection here however not making progress by my view 3/29; left lateral foot. Surface of the wound again looks reasonably healthy still thick skin and subcutaneous tissue around the wound margins. There is no evidence of infection. One of the concerns being brought up by the nurses has again the amount of drainage vis--vis continued use of a total contact cast 4/5;  left lateral foot at roughly the base of the fifth metatarsal. Nice healthy looking granulated tissue with rims of epithelialization. The overall wound measurements are not any better but the tissue looks healthy. The only concern is the amount of drainage although he has no surrounding maceration with what we have been doing recently to absorb fluid and protect his skin. He also has  lymphedema. 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 He states that the kerlix/coban under the TCC rolled down and could not get it back up. He has been using an offloading scooter and has somehow hurt his right foot using this device. This happened last week. He states that the side of his right foot developed a blister and opened. The top of his foot also has a few small open wounds he thinks is due to his socks rubbing in his shoes. He has not been using any dressings to the wound. He denies purulent drainage, fever/chills or erythema to the wounds. 4/22; patient presents for 1 week follow-up. He developed new wounds to the right foot that were evaluated at last clinic visit. He continues to have a total contact cast to the left leg and he reports no issues. He has been using silver collagen to the right foot wounds with no issues. He denies purulent drainage, fever/chills or erythema to the right foot wounds. He has no complaints today 4/25; patient presents for 1 week follow-up. He has a total contact cast of the left leg and reports no issues. He has been using silver alginate to the right foot wound. He denies purulent drainage, fever/chills or erythema to the right foot wounds. 5/2 patient presents for 1 week follow-up. T contact cast on the left. The wound which is on the base of the plantar foot at the base of the fifth metatarsal otal actually looks quite good and dimensions continue to gradually contract. HOWEVER the area on the right lateral fifth metatarsal head is much larger than what I remember from 2 weeks ago. Once more is he has significant levels of hypergranulation. Noteworthy that he had this same hyper granulated response on his  wound on the left foot at one point in time. So much so that he I thought there was an underlying fluid collection. Based on this I think this just needs debridement. 5/9; the wound on the left actually continues to be gradually smaller with a healthy surface. Slight amount of drainage and maceration of the skin around but not too bad. However he has a large wound over the right fifth metatarsal head very much in the same configuration as his left foot wound was initially. I used silver nitrate to address the hyper granulated tissue no mechanical debridement 5/16; area on the left foot did not look as healthy this week deeper thick surrounding macerated skin and subcutaneous tissue. The area on the right foot fifth met head was about the same The area on the right ankle that we identified last week is completely broken down into an open wound presumably a stocking rubbing issue 5/23; patient has been using a total contact cast to the left side. He has been using silver alginate underneath. He has also been using silver alginate to the right  foot wounds. He has no complaints today. He denies any signs of infection. 5/31; the left-sided wound looks some better measure smaller surface granulation looks better. We have been using silver alginate under the total contact cast The large area on his right fifth met head and right dorsal foot look about the same still using silver alginate 6/6; neither side is good as I was hoping although the surface area dimensions are better. A lot of maceration on his left and right foot around the wound edge. Area on the dorsal right foot looks better. He says he was traveling. I am not sure what does the amount of maceration around the plantar wounds may be drainage issues 6/13; in general the wound surfaces look quite good on both sides. Macerated skin and raised edges around the wound required debridement although in general especially on the left the surface area  seems improved. The area on the right dorsal ankle is about the same I thought this would not be such a problem to close 6/20; not much change in either wound although the one on the right looks a little better. Both wounds have thick macerated edges to the skin requiring debridements. We have been using silver alginate. The area on the dorsal right ankle is still open I thought this would be closed. 6/28; patient comes in today with a marked deterioration in the right foot wound fifth met head. Wide area of exposed bone this is a drastic change from last time. The area on the left there we have been casting is stagnant. We have been using silver alginate in both wound areas. 7/5; bone culture I did for PCR last time was positive for Pseudomonas, group B strep, Enterococcus and Staph aureus. There was no suggestion of methicillin resistance or ampicillin resistant genes. This was resistant to tetracycline however He comes into the clinic today with the area over his right plantar fifth metatarsal head which had been doing so well 2 weeks ago completely necrotic feeling bone. I do not know that this is going to be salvageable. The left foot wound is certainly no smaller but it has a better surface and is superficial. 7/8; patient called in this morning to say that his total contact cast was rubbing against his foot. He states he is doing fine overall. He denies signs of infection. 7/12; continued deterioration in the wound over the right fifth metatarsal head crumbling bone. This is not going to be salvageable. The patient agrees and wants to be referred to Dr. Doran Durand which we will attempt to arrange as soon as possible. I am going to continue him on antibiotics as long as that takes so I will renew those today. The area on the left foot which is the base of the fifth metatarsal continues to look somewhat better. Healthy looking tissue no depth no debridement is necessary here. 7/20; the patient was  kindly seen by Dr. Doran Durand of orthopedics on 10/19/2020. 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 Towards this end we have taken him out of the total contact cast on the left we will put him in a wrap with Hydrofera Blue. As I understand things surgery is planned for 7/21 7/27; patient had his surgery last Thursday. 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 Apparently everything went well we did not still disturb that today The area on the left foot  actually looks quite good. He has been much less mobile which probably explains this he did not seem to do well in the total contact cast secondary to drainage and maceration I think. 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 Fortunately there is no evidence of active infection at this time. No fevers, chills, nausea, vomiting, or diarrhea. Overall I think that he is actually doing extremely well. Nonetheless I do believe that he is staying off of this more following the surgery in his right foot that is the reason the left is doing so great. 8/16; left plantar foot wound. This looks smaller than the last time I saw this he is using Hydrofera Blue. The surgical wound on the right foot is being followed by Dr. Doran Durand we did not look at this today. He has surgical shoes on both feet 8/23; left plantar foot wound not as good this week. Surrounding macerated skin and subcutaneous tissue everything looks moist and wet. I do not think he is offloading this adequately. He is using a surgical shoe Apparently the right foot surgical wound is not open although I did not check his foot 8/31; left plantar foot lateral aspect. Much improved this week. He has no maceration. Some improvement in the surface area of the wound but most impressively the depth is come in we  are using silver alginate. The patient is a Product/process development scientist. He is asked that we write him a letter so he can go back to work. I have also tried to see if we can write something that will allow him to limit the amount of time that he is on his foot at work. 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 Hopefully the latter can be avoided 9/6; I believe the patient missed an appointment last week. He arrives in today with a wound looking roughly the same certainly no better. Undermining laterally and also inferiorly. We used molecuLight today in training with the patient's permission.. We are using silver alginate 9/21 wound is measuring bigger this week although this may have to do with the aggressive circumferential debridement last week in response to the blush fluorescence on the MolecuLight. Culture I did last week showed significant MSSA and E. coli. I put him on Augmentin but he has not started it yet. We are also going to send this for compounded antibiotics at [facility]. There is no evidence of systemic infection 9/29; silver alginate. His Keystone arrived. He is completing Augmentin in 2 days. Offloading in a cam boot. Moderate drainage per our intake staff 10/5; using silver alginate. He has been using his [place]. He has completed his Augmentin. Per our intake nurse still a lot of drainage, far too much to consider a total contact cast. Wound measures about the same. He had the same undermining area that I defined last week from a roughly 11-3. I remove this today 10/12; using silver alginate he is using the [place]. He comes in for a nurse visit hence we are applying [employer] twice a week. Measuring slightly better today and less notable drainage. Extensive debridement of the wound edge last time 10/18; using topical Keystone and silver alginate and a soft cast. Wound measurements about the same. Drainage  was through his soft cast. We are changing this twice a week  Tuesdays and Friday 10/25; comes in with moderate drainage. Still using Keystone silver alginate and a soft cast. Wound dimensions completely the same.He has a lot of edema in the left leg he has lymphedema. Asking for [name] to consider wrapping him as he cannot get his stocking on over the soft cast 11/2; comes in with moderate to large drainage slightly smaller in terms of width we have been using [place]. His wound looks satisfactory but not much improvement 11/4; patient presents today for obligatory cast change. Has no issues or complaints today. He denies signs of infection. 11/9; patient traveled this weekend to [place], was on the cast quite a bit. Staining of the cast with black material from his walking boot. Drainage was not quite as bad as we feared. Using silver alginate and Keystone 11/16; we do not have size for cast therefore we have been putting a soft cast on him since the change on Friday. Still a significant amount of drainage necessitating changing twice a week. We have been using the Keystone at cast changes either hard or soft as well as silver alginate Comes in the clinic with things actually looking fairly good improvement in width. He says his offloading is about the same Electronic Signature(s) Signed: 02/18/2021 1:42:03 PM By: [name] MD Entered By: [name] on 02/17/2021 08:15:10 -------------------------------------------------------------------------------- Soft Cast Details Patient Name: Date of Service: [name], [name] 02/17/2021 7:30 A M Medical Record Number: [id number] Patient Account Number: [id number] Date of Birth/Sex: Treating RN: 02-Sep-1985 (35 y.o. [name] Primary Care Provider: [name] Other Clinician: Referring Provider: Treating Provider/Extender: [name] in Treatment: 92 Procedure Performed for: Wound #3 Left,Lateral  Foot Performed By: Physician [name], MD Post Procedure Diagnosis Same as Pre-procedure Electronic Signature(s) Signed: 02/18/2021 1:42:03 PM By: [name] MD Entered By: [name] on 02/17/2021 08:13:51 -------------------------------------------------------------------------------- Physical Exam Details Patient Name: Date of Service: [name], [name] 02/17/2021 7:30 A M Medical Record Number: [id number] Patient Account Number: [id number] Date of Birth/Sex: Treating RN: Feb 13, 1986 (35 y.o. [name] Primary Care Provider: [name] Other Clinician: Referring Provider: Treating Provider/Extender: [name] in Treatment: 40 Constitutional Patient is hypertensive.. Pulse regular and within target range for patient.Marland Kitchen Respirations regular, non-labored and within target range.. Temperature is normal and within the target range for the patient.Marland Kitchen Appears in no distress. Cardiovascular Pedal pulses are palpable. Notes Wound exam; left plantar foot granulation looks very healthy. Again he has raised areas of skin and subcutaneous tissue around the margin. By itself this does not look like the type of surface I like to see. Nevertheless the surface area appears to be improving there is rims of epithelialization no evidence of infection at least visually at the bedside. 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 [name] Primary Care Provider: [name] Other  Clinician: Referring Provider: Treating Provider/Extender: [name] in Treatment: 29 Verbal / Phone Orders: No Diagnosis Coding ICD-10 Coding Code Description E11.621 Type 2 diabetes mellitus with foot ulcer L97.528 Non-pressure chronic ulcer of other part of left foot with other specified severity Follow-up Appointments Return Appointment  in 1 week. Nurse Visit: - Friday to change soft cast Bathing/ Shower/ Hygiene May shower with protection but do not get wound dressing(s) wet. Edema Control - Lymphedema / SCD / Other Bilateral Lower Extremities Elevate legs to the level of the heart or above for 30 minutes daily and/or when sitting, a frequency of: - throughout the day Avoid standing for long periods of time. Exercise regularly Moisturize legs daily. - right leg every night before bed. Compression stocking or Garment 20-30 mm/Hg pressure to: - Apply to right leg in the morning and remove at night. Off-Loading Other: - minimal weight bearing left foot Other: - Soft cast to left foot Additional Orders / Instructions Follow Nutritious Diet Wound Treatment Wound #3 - Foot Wound Laterality: Left, Lateral Cleanser: Soap and Water 2 x Per Week/30 Days Discharge Instructions: May shower and wash wound with dial antibacterial soap and water prior to dressing change. Cleanser: Wound Cleanser (Generic) 2 x Per Week/30 Days Discharge Instructions: Cleanse the wound with wound cleanser prior to applying a clean dressing using gauze sponges, not tissue or cotton balls. Peri-Wound Care: Zinc Oxide Ointment 30g tube 2 x Per Week/30 Days Discharge Instructions: Apply Zinc Oxide to periwound with each dressing change as needed. Topical: keystone antibiotic compound 2 x Per Week/30 Days Discharge Instructions: thin layer to wound bed Prim Dressing: KerraCel Ag Gelling Fiber Dressing, 4x5 in (silver alginate) (Generic) 2 x Per Week/30 Days ary Discharge  Instructions: Apply silver alginate to wound bed as instructed Secondary Dressing: ABD Pad, 8x10 2 x Per Week/30 Days Discharge Instructions: Apply over primary dressing as directed. Secondary Dressing: Zetuvit Plus 4x8 in (Generic) 2 x Per Week/30 Days Discharge Instructions: Apply over primary dressing as directed. Compression Wrap: Kerlix Roll 4.5x3.1 (in/yd) 2 x Per Week/30 Days Discharge Instructions: Apply Kerlix and Coban compression as directed. Compression Wrap: Coban Self-Adherent Wrap 4x5 (in/yd) 2 x Per Week/30 Days Discharge Instructions: Apply over Kerlix as directed. Electronic Signature(s) Signed: 02/18/2021 1:42:03 PM By: [name] MD Signed: 02/18/2021 5:23:58 PM By: [name] RN, BSN Entered By: [name] on 02/17/2021 08:09:47 -------------------------------------------------------------------------------- Problem List Details Patient Name: Date of Service: [name], [name] 02/17/2021 7:30 A M Medical Record Number: [id number] Patient Account Number: [id number] Date of Birth/Sex: Treating RN: [birth year] (35 y.o. [name] Primary Care Provider: [name] Other Clinician: Referring Provider: Treating Provider/Extender: [name] in Treatment: 68 Active Problems ICD-10 Encounter Code Description Active Date MDM Diagnosis E11.621 Type 2 diabetes mellitus with foot ulcer 10/24/2019 No Yes L97.528 Non-pressure chronic ulcer of other part of left foot with other specified 10/24/2019 No Yes severity Inactive Problems ICD-10 Code Description Active Date Inactive Date L97.518 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ity 10/24/2019 10/24/2019 L97.518 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ity 07/14/2020 07/14/2020 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, right ankle and foot 10/24/2019 10/24/2019 L97.318 Non-pressure chronic ulcer of right  ankle with other specified severity 08/10/2020 08/10/2020 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, left ankle and foot 10/24/2019 10/24/2019 B95.62 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 10/24/2019 10/24/2019 classified elsewhere Resolved Problems Electronic Signature(s) Signed: 02/18/2021 1:42:03 PM By: [name] MD Entered By: [name] on 02/17/2021 08:13:11 -------------------------------------------------------------------------------- Progress Note Details Patient Name: Date of Service: [name], [name] 02/17/2021 7:30 A M Medical Record Number: [id number] Patient Account Number: [id number] Date of Birth/Sex: Treating RN: 11/14/1985 (35 y.o. 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 He comes in for review of a wound over the plantar fifth metatarsal head extending into the lateral part of the foot. He was followed for this previously by his podiatrist Dr. Cornelius Moras. As the patient tells his story he went to see podiatry first for a swelling he developed on the lateral part of his fifth metatarsal head in May. He states this was "open" by podiatry and the area closed. He was followed up in June and it was again opened callus removed and it closed promptly. There were plans being made for surgery on the fifth metatarsal head in June however his blood sugar was apparently too high for anesthesia. Apparently the area was debrided and opened again in June and it is never closed since. Looking over the records from podiatry I am really not able to follow this. It was clear when he was first seen it was before 5/14 at that point he already had a wound. By 5/17  the ulcer was resolved. I do not see anything about a procedure. On 5/28 noted to have pre-ulcerative moderate keratosis. X-ray noted 1/5 contracted toe and tailor's bunion and metatarsal deformity. On a visit date on 09/28/2018 the dorsal part of the left foot it healed and resolved. There was concern about swelling in his lower extremity he was sent to the ER.. As far as I can tell he was seen in the ER on 7/12 with an ulcer on his left foot. A DVT rule out of the left leg was negative. I do not think I have complete records from podiatry but I am not able to verify the procedures this patient states he had. He states after the last procedure the wound has never closed although I am not able to follow this in the records I have from podiatry. He has not had a recent x-ray The patient has been using Neosporin on the wound. He is wearing a Darco shoe. He is still very active up on his foot working and exercising. Past medical history; type 2 diabetes ketosis-prone, leg swelling with a negative DVT study in July. Non-smoker ABI in our clinic was 0.85 on the left 10/16; substantial wound on the plantar left fifth met head extending laterally almost to the dorsal fifth MTP. We have been using silver alginate we gave him a Darco forefoot off loader. An x-ray did not show evidence of osteomyelitis did note soft tissue emphysema which I think was due to gas tracking through an open wound. There is no doubt in my mind he requires an MRI 10/23; MRI not booked until 3 November at the earliest this is largely due to his glucose sensor in the right arm. We have been using silver alginate. There has been an improvement 10/29; I am still not exactly sure when his MRI is booked for. He says it is the third but it is the 10th in epic. This definitely needs to be done. He is running a low-grade fever today but no other symptoms. No real improvement in the 1 02/26/2019 patient presents today for a follow-up visit here  in our clinic he is last been seen in the clinic on October 29. Subsequently we were working on getting MRI to evaluate and see what  exactly was going on and where we would need to go from the standpoint of whether or not he had osteomyelitis and again what treatments were going be required. Subsequently the patient ended up being admitted to the hospital on 02/07/2019 and was discharged on 02/14/2019. This is a somewhat interesting admission with a discharge diagnosis of pneumonia due to COVID-19 although he was positive for COVID-19 when tested at the urgent care but negative x2 when he was actually in the hospital. With that being said he did have acute respiratory failure with hypoxia and it was noted he also have a left foot ulceration with osteomyelitis. With that being said he did require oxygen for his pneumonia and I level 4 L. He was placed on antivirals and steroids for the COVID-19. He was also transferred to the [place] at one point. Nonetheless he did subsequently discharged home and since being home has done much better in that regard. The CT angiogram did not show any pulmonary embolism. With regard to the osteomyelitis the patient was placed on vancomycin and Zosyn while in the hospital but has been changed to Augmentin at discharge. It was also recommended that he follow- up with wound care and podiatry. Podiatry however wanted him to see [name] according to the patient prior to them doing anything further. His hemoglobin A1c was 9.9 as noted in the hospital. Have an MRI of the left foot performed while in the hospital on 02/04/2019. 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 There is an overlying plantar open wound noted an abscess tracking back along the lateral aspect of the fifth metatarsal shaft. There is otherwise diffuse cellulitis and mild fasciitis without findings of  polymyositis. The patient did have recently pneumonia secondary to COVID-19 I looked in the chart through epic and it does appear that the patient may need to have an additional x-ray just to ensure everything is cleared and that he has no airspace disease prior to putting him into the [name]. 03/05/2019; patient was readmitted to the clinic last week. He was hospitalized twice for a viral upper respiratory tract infection from 11/1 through 11/4 and then 11/5 through 11/12 ultimately this turned out to be Covid pneumonitis. Although he was discharged on oxygen he is not using it. He says he feels fine. He has no exercise limitation no cough no sputum. His O2 sat in our clinic today was 100% on room air. 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 He received Vanco and Zosyn in the hospital and then was discharged on 2 weeks of Augmentin. I do not see any relevant cultures. He was supposed to follow-up with infectious disease but I do not see that he has an appointment. 12/8; patient saw Dr. Novella Olive of infectious disease last week. He felt that he had had adequate antibiotic therapy. He did not go to follow-up with Dr. Amalia Hailey of podiatry and I have again talked to him about the pros and cons of this. He does not want to consider a ray amputation of this time. He is aware of the risks of recurrence, migration etc. He started HBO today and tolerated this well. He can complete the Augmentin that I gave him last week. I have looked over the lab work that Dr. Chana Bode ordered his C-reactive protein was 3.3 and  his sedimentation rate was 17. The C-reactive protein is never really been measurably that high in this patient 12/15; not much change in the wound today however he has undermining along the lateral part of the foot again more extensively than last week. He has some rims of  epithelialization. We have been using silver alginate. He is undergoing hyperbarics but did not dive today 12/18; in for his obligatory first total contact cast change. Unfortunately there was pus coming from the undermining area around his fifth metatarsal head. This was cultured but will preclude reapplication of a cast. He is seen in conjunction with HBO 12/24; patient had staph lugdunensis in the wound in the undermining area laterally last time. We put him on doxycycline which should have covered this. The wound looks better today. I am going to give him another week of doxycycline before reattempting the total contact cast 12/31; the patient is completing antibiotics. Hemorrhagic debris in the distal part of the wound with some undermining distally. He also had hyper granulation. Extensive debridement with a #5 curette. The infected area that was on the lateral part of the fifth met head is closed over. I do not think he needs any more antibiotics. Patient was seen prior to HBO. Preparations for a total contact cast were made in the cast will be placed post hyperbarics 04/11/19; once again the patient arrives today without complaint. He had been in a cast all week noted that he had heavy drainage this week. This resulted in large raised areas of macerated tissue around the wound 1/14; wound bed looks better slightly smaller. Hydrofera Blue has been changing himself. He had a heavy drainage last week which caused a lot of maceration around the wound so I took him out of a total contact cast he says the drainage is actually better this week He is seen today in conjunction with HBO 1/21; returns to clinic. He was up in [place] for a day or 2 attending a funeral. He comes back in with the wound larger and with a large area of exposed bone. He had osteomyelitis and septic arthritis of the fifth left metatarsal head while he was in hospital. He received IV antibiotics in the hospital for a  prolonged period of time then 3 weeks of Augmentin. Subsequently I gave him 2 weeks of doxycycline for more superficial wound infection. When I saw this last week the wound was smaller the surface of the wound looks satisfactory. 1/28; patient missed hyperbarics today. Bone biopsy I did last time showed Enterococcus faecalis and Staphylococcus lugdunensis . He has a wide area of exposed bone. We are going to use silver alginate as of today. I had another ethical discussion with the patient. 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 In this situation I think the likelihood of healing this is low. Therefore I have recommended a ray amputation and with the patient's agreement I have referred him to Dr. Doran Durand. The other issue is that his compliance with hyperbarics has been minimal because of his work schedule and given his underlying decision I am going to stop this today READMISSION 10/24/2019 MRI 09/29/2019 left foot IMPRESSION: 1. Apparent skin ulceration inferior and lateral to the 5th metatarsal base with underlying heterogeneous T2 signal and enhancement in the subcutaneous fat. Small peripherally enhancing fluid collections along the plantar and lateral aspects of the 5th metatarsal base suspicious for abscesses. 2. Interval amputation through the mid 5th metatarsal with nonspecific low-level marrow edema and  enhancement. Given the proximity to the adjacent soft tissue inflammatory changes, osteomyelitis cannot be excluded. 3. The additional bones appear unremarkable. MRI 09/29/2019 right foot IMPRESSION: 1. Soft tissue ulceration lateral to the 5th MTP joint. There is low-level T2 hyperintensity within the 4th and 5th metatarsal heads and adjacent proximal phalanges without abnormal T1 signal or cortical destruction. These findings are nonspecific and could be seen with early marrow edema, hyperemia or early osteomyelitis. No evidence of septic  joint. 2. Mild tenosynovitis and synovial enhancement associated with the extensor digitorum tendons at the level of the midfoot. 3. Diffuse low-level muscular T2 hyperintensity and enhancement, most consistent with diabetic myopathy. LEFT FOOT BONE Methicillin resistant staphylococcus aureus Staphylococcus lugdunensis MIC MIC CIPROFLOXACIN >=8 RESISTANT Resistant <=0.5 SENSI... Sensitive CLINDAMYCIN <=0.25 SENS... Sensitive >=8 RESISTANT Resistant ERYTHROMYCIN >=8 RESISTANT Resistant >=8 RESISTANT Resistant GENTAMICIN <=0.5 SENSI... Sensitive <=0.5 SENSI... Sensitive Inducible Clindamycin NEGATIVE Sensitive NEGATIVE Sensitive OXACILLIN >=4 RESISTANT Resistant 2 SENSITIVE Sensitive RIFAMPIN <=0.5 SENSI... Sensitive <=0.5 SENSI... Sensitive TETRACYCLINE <=1 SENSITIVE Sensitive <=1 SENSITIVE Sensitive TRIMETH/SULFA <=10 SENSIT Sensitive <=10 SENSIT Sensitive ... Marland Kitchen.. VANCOMYCIN 1 SENSITIVE Sensitive <=0.5 SENSI... Sensitive Right foot bone . Component 3 wk ago Specimen Description BONE Special Requests RIGHT 4 METATARSAL SAMPLE B Gram Stain NO WBC SEEN NO ORGANISMS SEEN Culture RARE METHICILLIN RESISTANT STAPHYLOCOCCUS AUREUS NO ANAEROBES ISOLATED Performed at [facility], [place] [address] [place], [place] [address] Report Status 10/08/2019 FINAL Organism ID, Bacteria METHICILLIN RESISTANT STAPHYLOCOCCUS AUREUS Resulting Agency [facility] Susceptibility Methicillin resistant staphylococcus aureus MIC CIPROFLOXACIN >=8 RESISTANT Resistant CLINDAMYCIN <=0.25 SENS... Sensitive ERYTHROMYCIN >=8 RESISTANT Resistant GENTAMICIN <=0.5 SENSI... Sensitive Inducible Clindamycin NEGATIVE Sensitive OXACILLIN >=4 RESISTANT Resistant RIFAMPIN <=0.5 SENSI... Sensitive TETRACYCLINE <=1 SENSITIVE Sensitive TRIMETH/SULFA <=10 SENSIT Sensitive ... VANCOMYCIN 1 SENSITIVE Sensitive This is a patient we had in clinic earlier this year with a wound over his left fifth metatarsal head. 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 Doran Durand on 05/16/2019. He seems to have developed problems with wounds on his bilateral feet in June 2021 although he may have had problems earlier than this. He was in an urgent care with a right foot ulcer on 09/26/2019 and given a course of doxycycline. This was apparently after having trouble getting into see orthopedics. He was seen by podiatry on 09/28/2019 noted to have bilateral lower extremity ulcers including the left lateral fifth metatarsal base and the right subfifth met head. It was noted that had purulent drainage at that time. He required hospitalization from 6/20 through 7/2. This was because of worsening right foot wounds. He underwent bilateral operative incision and drainage and bone biopsies bilaterally. Culture results are listed above. He has been referred back to clinic by Dr. Jacqualyn Posey of podiatry. He is also followed by Dr. Megan Salon who saw him yesterday. He was discharged from hospital on Zyvox Flagyl and Levaquin and yesterday changed to doxycycline Flagyl and Levaquin. His inflammatory markers on 6/26 showed a sedimentation rate of 129 and a C-reactive protein of 5. This is improved to 14 and 1.3 respectively. This would indicate improvement. ABIs in our clinic today were 1.23 on the right and 1.20 on the left 11/01/2019 on evaluation today patient appears to be doing fairly well in regard to  the wounds on his feet at this point. Fortunately there is no signs of active infection at this time. No fevers, chills, nausea, vomiting, or diarrhea. He currently is seeing infectious disease and still under their care at this point. Subsequently he also has both wounds  which she has not been using collagen on as he did not receive that in his packaging he did not call us and let us know that. Apparently that just was missed on the order. Nonetheless we will get that straightened out today. 8/9-Patient returns for bilateral foot wounds, using Prisma with hydrogel moistened dressings, and the wounds appear stable. Patient using surgical shoes, avoiding much pressure or weightbearing as much as possible 8/16; patient has bilateral foot wounds. 1 on the right lateral foot proximally the other is on the left mid lateral foot. Both required debridement of callus and thick skin around the wounds. We have been using silver collagen 8/27; patient has bilateral lateral foot wounds. The area on the left substantially surrounded by callus and dry skin. This was removed from the wound edge. The underlying wound is small. The area on the right measured somewhat smaller today. 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 Unfortunately I did not update his antibiotics during today's visit. 9/10 I reviewed Dr. Hale Bogus last notes he felt he had completed antibiotics his inflammatory markers were reasonably well controlled. He has a small wound on the lateral left foot and a tiny area on the right which is just above closed. He is using Hydrofera Blue with border foam he has bilateral surgical shoes 9/24; 2 week f/u. doing well. right foot is closed. left foot still undermined. 10/14; right foot remains closed at the fifth met head. The area over the base of the left fifth metatarsal has a small open area but considerable undermining towards the plantar foot. Thick callus skin around this suggests an adequate pressure relief. We have talked about this. He says he is going to go back into his cam boot. I suggested a total contact cast he did not seem enamored with this suggestion 10/26; left foot base of the fifth  metatarsal. Same condition as last time. He has skin over the area with an open wound however the skin is not adherent. He went to see Dr. Earleen Newport who did an x-ray and culture of his foot I have not reviewed the x-ray but the patient was not told anything. He is on doxycycline 11/11; since the patient was last here he was in the emergency room on 10/30 he was concerned about swelling in the left foot. They did not do any cultures or x-rays. They changed his antibiotics to cephalexin. Previous culture showed group B strep. The cephalexin is appropriate as doxycycline has less than predictable coverage. Arrives in clinic today with swelling over this area under the wound. He also has a new wound on the right fifth metatarsal head 11/18; the patient has a difficult wound on the lateral aspect of the left fifth metatarsal head. The wound was almost ballotable last week I opened it slightly expecting to see purulence however there was just bleeding. I cultured this this was negative. X-ray unchanged. We are trying to get an MRI but I am not sure were going to be able to get this through his insurance. He also has an area on the right lateral fifth metatarsal head this looks healthier 12/3; the patient finally got our MRI. Surprisingly  this did not show osteomyelitis. I did show the soft tissue ulceration at the lateral plantar aspect of the fifth metatarsal base with a tiny residual 6 mm abscess overlying the superficial fascia I have tried to culture this area I have not been able to get this to grow anything. Nevertheless the protruding tissue looks aggravated. I suspect we should try to treat the underlying "abscess with broad-spectrum antibiotics. I am going to start him on Levaquin and Flagyl. He has much less edema in his legs and I am going to continue to wrap his legs and see him weekly 12/10. I started Levaquin and Flagyl on him last week. He just picked up the Flagyl apparently there was some delay.  The worry is the wound on the left fifth metatarsal base which is substantial and worsening. His foot looks like he inverts at the ankle making this a weightbearing surface. Certainly no improvement in fact I think the measurements of this are somewhat worse. We have been using 12/17; he apparently just got the Levaquin yesterday this is 2 weeks after the fact. He has completed the Flagyl. The area over the left fifth metatarsal base still has protruding granulation tissue although it does not look quite as bad as it did some weeks ago. He has severe bilateral lymphedema although we have not been treating him for wounds on his legs this is definitely going to require compression. There was so much edema in the left I did not wish to put him in a total contact cast today. I am going to increase his compression from 3-4 layer. The area on the right lateral fifth met head actually look quite good and superficial. 12/23; patient arrived with callus on the right fifth met head and the substantial hyper granulated callused wound on the base of his fifth metatarsal. He says he is completing his Levaquin in 2 days but I do not think that adds up with what I gave him but I will have to double check this. We are using Hydrofera Blue on both areas. My plan is to put the left leg in a cast the week after New Year's 04/06/2020; patient's wounds about the same. Right lateral fifth metatarsal head and left lateral foot over the base of the fifth metatarsal. There is undermining on the left lateral foot which I removed before application of total contact cast continuing with Hydrofera Blue new. Patient tells me he was seen by endocrinology today lab work was done [Dr. Kerr]. Also wondering whether he was referred to cardiology. I went over some lab work from previously does not have chronic renal failure certainly not nephrotic range proteinuria he does have very poorly controlled diabetes but this is not his most  updated lab work. Hemoglobin A1c has been over 11 1/10; the patient had a considerable amount of leakage towards mid part of his left foot with macerated skin however the wound surface looks better the area on the right lateral fifth met head is better as well. I am going to change the dressing on the left foot under the total contact cast to silver alginate, continue with Hydrofera Blue on the right. 1/20; patient was in the total contact cast for 10 days. Considerable amount of drainage although the skin around the wound does not look too bad on the left foot. The area on the right fifth metatarsal head is closed. Our nursing staff reports large amount of drainage out of the left lateral foot wound 1/25; continues with  copious amounts of drainage described by our intake staff. PCR culture I did last week showed E. coli and Enterococcus faecalis and low quantities. Multiple resistance genes documented including extended spectrum beta lactamase, MRSA, MRSE, quinolone, tetracycline. The wound is not quite as good this week as it was 5 days ago but about the same size 2/3; continues with copious amounts of malodorous drainage per our intake nurse. The PCR culture I did 2 weeks ago showed E. coli and low quantities of Enterococcus. There were multiple resistance genes detected. I put Neosporin on him last week although this does not seem to have helped. The wound is slightly deeper today. Offloading continues to be an issue here although with the amount of drainage she has a total contact cast is just not going to work 2/10; moderate amount of drainage. Patient reports he cannot get his stocking on over the dressing. I told him we have to do that the nurse gave him suggestions on how to make this work. The wound is on the bottom and lateral part of his left foot. Is cultured predominantly grew low amounts of Enterococcus, E. coli and anaerobes. There were multiple resistance genes detected including  extended spectrum beta lactamase, quinolone, tetracycline. I could not think of an easy oral combination to address this so for now I am going to do topical antibiotics provided by [name] I think the main agents here are vancomycin and an aminoglycoside. We have to be able to give him access to the wounds to get the topical antibiotic on 2/17; moderate amount of drainage this is unchanged. He has his Keystone topical antibiotic against the deep tissue culture organisms. He has been using this and changing the dressing daily. Silver alginate on the wound surface. 2/24; using Keystone antibiotic with silver alginate on the top. He had too much drainage for a total contact cast at one point although I think that is improving and I think in the next week or 2 it might be possible to replace a total contact cast I did not do this today. In general the wound surface looks healthy however he continues to have thick rims of skin and subcutaneous tissue around the wide area of the circumference which I debrided 06/04/2020 upon evaluation today patient appears to be doing well in regard to his wound. I do feel like he is showing signs of improvement. There is little bit of callus and dead tissue around the edges of the wound as well as what appears to be a little bit of a sinus tract that is off to the side laterally I would perform debridement to clear that away today. 3/17; left lateral foot. The wound looks about the same as I remember. Not much depth surface looks healthy. No evidence of infection 3/25; left lateral foot. Wound surface looks about the same. Separating epithelium from the circumference. There really is no evidence of infection here however not making progress by my view 3/29; left lateral foot. Surface of the wound again looks reasonably healthy still thick skin and subcutaneous tissue around the wound margins. There is no evidence of infection. One of the concerns being brought up by the  nurses has again the amount of drainage vis--vis continued use of a total contact cast 4/5; left lateral foot at roughly the base of the fifth metatarsal. Nice healthy looking granulated tissue with rims of epithelialization. The overall wound measurements are not any better but the tissue looks healthy. The only concern is the amount  of drainage although he has no surrounding maceration with what we have been doing recently to absorb fluid and protect his skin. He also has lymphedema. 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 He states that the kerlix/coban under the TCC rolled down and could not get it back up. He has been using an offloading scooter and has somehow hurt his right foot using this device. This happened last week. He states that the side of his right foot developed a blister and opened. The top of his foot also has a few small open wounds he thinks is due to his socks rubbing in his shoes. He has not been using any dressings to the wound. He denies purulent drainage, fever/chills or erythema to the wounds. 4/22; patient presents for 1 week follow-up. He developed new wounds to the right foot that were evaluated at last clinic visit. He continues to have a total contact cast to the left leg and he reports no issues. He has been using silver collagen to the right foot wounds with no issues. He denies purulent drainage, fever/chills or erythema to the right foot wounds. He has no complaints today 4/25; patient presents for 1 week follow-up. He has a total contact cast of the left leg and reports no issues. He has been using silver alginate to the right foot wound. He denies purulent drainage, fever/chills or erythema to the right foot  wounds. 5/2 patient presents for 1 week follow-up. T contact cast on the left. The wound which is on the base of the plantar foot at the base of the fifth metatarsal otal actually looks quite good and dimensions continue to gradually contract. HOWEVER the area on the right lateral fifth metatarsal head is much larger than what I remember from 2 weeks ago. Once more is he has significant levels of hypergranulation. Noteworthy that he had this same hyper granulated response on his wound on the left foot at one point in time. So much so that he I thought there was an underlying fluid collection. Based on this I think this just needs debridement. 5/9; the wound on the left actually continues to be gradually smaller with a healthy surface. Slight amount of drainage and maceration of the skin around but not too bad. However he has a large wound over the right fifth metatarsal head very much in the same configuration as his left foot wound was initially. I used silver nitrate to address the hyper granulated tissue no mechanical debridement 5/16; area on the left foot did not look as healthy this week deeper thick surrounding macerated skin and subcutaneous tissue. oo The area on the right foot fifth met head was about the same oo The area on the right ankle that we identified last week is completely broken down into an open wound presumably a stocking rubbing issue 5/23; patient has been using a total contact cast to the left side. He has been using silver alginate underneath. He has also been using silver alginate to the right foot wounds. He has no complaints today. He denies any signs of infection. 5/31; the left-sided wound looks some better measure smaller surface granulation looks better. We have been using silver alginate under the total contact cast oo  The large area on his right fifth met head and right dorsal foot look about the same still using silver alginate 6/6; neither side is good as I  was hoping although the surface area dimensions are better. A lot of maceration on his left and right foot around the wound edge. Area on the dorsal right foot looks better. He says he was traveling. I am not sure what does the amount of maceration around the plantar wounds may be drainage issues 6/13; in general the wound surfaces look quite good on both sides. Macerated skin and raised edges around the wound required debridement although in general especially on the left the surface area seems improved. oo The area on the right dorsal ankle is about the same I thought this would not be such a problem to close 6/20; not much change in either wound although the one on the right looks a little better. Both wounds have thick macerated edges to the skin requiring debridements. We have been using silver alginate. The area on the dorsal right ankle is still open I thought this would be closed. 6/28; patient comes in today with a marked deterioration in the right foot wound fifth met head. Wide area of exposed bone this is a drastic change from last time. The area on the left there we have been casting is stagnant. We have been using silver alginate in both wound areas. 7/5; bone culture I did for PCR last time was positive for Pseudomonas, group B strep, Enterococcus and Staph aureus. There was no suggestion of methicillin resistance or ampicillin resistant genes. This was resistant to tetracycline however He comes into the clinic today with the area over his right plantar fifth metatarsal head which had been doing so well 2 weeks ago completely necrotic feeling bone. I do not know that this is going to be salvageable. The left foot wound is certainly no smaller but it has a better surface and is superficial. 7/8; patient called in this morning to say that his total contact cast was rubbing against his foot. He states he is doing fine overall. He denies signs of infection. 7/12; continued deterioration  in the wound over the right fifth metatarsal head crumbling bone. This is not going to be salvageable. The patient agrees and wants to be referred to Dr. Doran Durand which we will attempt to arrange as soon as possible. I am going to continue him on antibiotics as long as that takes so I will renew those today. The area on the left foot which is the base of the fifth metatarsal continues to look somewhat better. Healthy looking tissue no depth no debridement is necessary here. 7/20; the patient was kindly seen by Dr. Doran Durand of orthopedics on 10/19/2020. 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 Towards this end we have taken him out of the total contact cast on the left we will put him in a wrap with Hydrofera Blue. As I understand things surgery is planned for 7/21 7/27; patient had his surgery last Thursday. 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 Apparently everything went well we did not still disturb that today The area on the left foot actually looks quite good. He has been much less mobile which probably explains this he did not seem to do well in the total contact cast secondary to drainage and maceration I think. We have been  using Hydrofera Blue 11/09/2020 upon evaluation today patient appears to be doing well with regard to his plantar foot ulcer on the left foot. Fortunately there is no evidence of active infection at this time. No fevers, chills, nausea, vomiting, or diarrhea. Overall I think that he is actually doing extremely well. Nonetheless I do believe that he is staying off of this more following the surgery in his right foot that is the reason the left is doing so great. 8/16; left plantar foot wound. This looks smaller than the last time I saw this he is using Hydrofera Blue. The surgical wound on the right foot is being followed by Dr. Doran Durand we did not look at this today. He has surgical  shoes on both feet 8/23; left plantar foot wound not as good this week. Surrounding macerated skin and subcutaneous tissue everything looks moist and wet. I do not think he is offloading this adequately. He is using a surgical shoe Apparently the right foot surgical wound is not open although I did not check his foot 8/31; left plantar foot lateral aspect. Much improved this week. He has no maceration. Some improvement in the surface area of the wound but most impressively the depth is come in we are using silver alginate. The patient is a Product/process development scientist. He is asked that we write him a letter so he can go back to work. I have also tried to see if we can write something that will allow him to limit the amount of time that he is on his foot at work. 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 Hopefully the latter can be avoided 9/6; I believe the patient missed an appointment last week. He arrives in today with a wound looking roughly the same certainly no better. Undermining laterally and also inferiorly. We used molecuLight today in training with the patient's permission.. We are using silver alginate 9/21 wound is measuring bigger this week although this may have to do with the aggressive circumferential debridement last week in response to the blush fluorescence on the MolecuLight. Culture I did last week showed significant MSSA and E. coli. I put him on Augmentin but he has not started it yet. We are also going to send this for compounded antibiotics at [facility]. There is no evidence of systemic infection 9/29; silver alginate. His Keystone arrived. He is completing Augmentin in 2 days. Offloading in a cam boot. Moderate drainage per our intake staff 10/5; using silver alginate. He has been using his [place]. He has completed his Augmentin. Per our intake nurse still a lot of drainage, far too much to consider  a total contact cast. Wound measures about the same. He had the same undermining area that I defined last week from a roughly 11-3. I remove this today 10/12; using silver alginate he is using the [place]. He comes in for a nurse visit hence we are applying [employer] twice a week. Measuring slightly better today and less notable drainage. Extensive debridement of the wound edge last time 10/18; using topical Keystone and silver alginate and a soft cast. Wound measurements about the same. Drainage was through his soft cast. We are changing this twice a week Tuesdays and Friday 10/25; comes in with moderate drainage. Still using Keystone silver alginate and a soft cast. Wound dimensions completely the same.He has a lot of edema in the left leg he has lymphedema. Asking  for [name] to consider wrapping him as he cannot get his stocking on over the soft cast 11/2; comes in with moderate to large drainage slightly smaller in terms of width we have been using [place]. His wound looks satisfactory but not much improvement 11/4; patient presents today for obligatory cast change. Has no issues or complaints today. He denies signs of infection. 11/9; patient traveled this weekend to [place], was on the cast quite a bit. Staining of the cast with black material from his walking boot. Drainage was not quite as bad as we feared. Using silver alginate and Keystone 11/16; we do not have size for cast therefore we have been putting a soft cast on him since the change on Friday. Still a significant amount of drainage necessitating changing twice a week. We have been using the Keystone at cast changes either hard or soft as well as silver alginate Comes in the clinic with things actually looking fairly good improvement in width. He says his offloading is about the same Objective Constitutional Patient is hypertensive.. Pulse regular and within target range for patient.Marland Kitchen Respirations regular, non-labored and within target  range.. Temperature is normal and within the target range for the patient.Marland Kitchen Appears in no distress. Vitals Time Taken: 7:47 AM, Height: 77 in, Weight: 280 lbs, BMI: 33.2, Temperature: 98.6 F, Pulse: 90 bpm, Respiratory Rate: 18 breaths/min, Blood Pressure: 161/92 mmHg, Capillary Blood Glucose: 138 mg/dl. General Notes: glucose per pt report Cardiovascular Pedal pulses are palpable. General Notes: Wound exam; left plantar foot granulation looks very healthy. Again he has raised areas of skin and subcutaneous tissue around the margin. By itself this does not look like the type of surface I like to see. Nevertheless the surface area appears to be improving there is rims of epithelialization no evidence of infection at least visually at the bedside. 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 Original cause of wound was Trauma. The date acquired was: 10/02/2019. The wound has been in treatment 68 weeks. The wound is located on the Left,Lateral Foot. The wound measures 3.2cm length x 2.5cm width x 0.4cm depth; 6.283cm^2 area and 2.513cm^3 volume. There is Fat Layer (Subcutaneous Tissue) exposed. There is no tunneling or undermining noted. There is a large amount of serosanguineous drainage noted. The wound margin is epibole. There is large (67-100%) red, pink granulation within the wound bed. There is a small (1-33%) amount of necrotic tissue within the wound bed including Adherent Slough. 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 An Soft Cast procedure was performed by [name], MD. Post procedure Diagnosis Wound #3: Same as Pre-Procedure Plan Follow-up  Appointments: Return Appointment in 1 week. Nurse Visit: - Friday to change soft cast Bathing/ Shower/ Hygiene: May shower with protection but do not get wound dressing(s) wet. Edema Control - Lymphedema / SCD / Other: Elevate legs to the level of the heart or above for 30 minutes daily and/or when sitting, a frequency of: - throughout the day Avoid standing for long periods of time. Exercise regularly Moisturize legs daily. - right leg every night before bed. Compression stocking or Garment 20-30 mm/Hg pressure to: - Apply to right leg in the morning and remove at night.  Off-Loading: Other: - minimal weight bearing left foot Other: - Soft cast to left foot Additional Orders / Instructions: Follow Nutritious Diet WOUND #3: - Foot Wound Laterality: Left, Lateral Cleanser: Soap and Water 2 x Per Week/30 Days Discharge Instructions: May shower and wash wound with dial antibacterial soap and water prior to dressing change. Cleanser: Wound Cleanser (Generic) 2 x Per Week/30 Days Discharge Instructions: Cleanse the wound with wound cleanser prior to applying a clean dressing using gauze sponges, not tissue or cotton balls. Peri-Wound Care: Zinc Oxide Ointment 30g tube 2 x Per Week/30 Days Discharge Instructions: Apply Zinc Oxide to periwound with each dressing change as needed. Topical: keystone antibiotic compound 2 x Per Week/30 Days Discharge Instructions: thin layer to wound bed Prim Dressing: KerraCel Ag Gelling Fiber Dressing, 4x5 in (silver alginate) (Generic) 2 x Per Week/30 Days ary Discharge Instructions: Apply silver alginate to wound bed as instructed Secondary Dressing: ABD Pad, 8x10 2 x Per Week/30 Days Discharge Instructions: Apply over primary dressing as directed. Secondary Dressing: Zetuvit Plus 4x8 in (Generic) 2 x Per Week/30 Days Discharge Instructions: Apply over primary dressing as directed. Com pression Wrap: Kerlix Roll 4.5x3.1 (in/yd) 2 x Per Week/30  Days Discharge Instructions: Apply Kerlix and Coban compression as directed. Com pression Wrap: Coban Self-Adherent Wrap 4x5 (in/yd) 2 x Per Week/30 Days Discharge Instructions: Apply over Kerlix as directed. #1 We will continue with the silver alginate Keystone under a soft cast until [place] are available 2. The patient really requires debridement of the skin and subcutaneous tissue around the wound margins although with improvement this week I elected not to do it. Careful follow-up of the surface area. If there is not improvement next week that may be an option to do it then. 3. I did not see any evidence of a problem with the wound surface. We are using a compounded Keystone antibiotic. 4. The drainage has provided a problem necessitating changing either the soft or hard casts on a twice weekly basis Electronic Signature(s) Signed: 02/18/2021 1:42:03 PM By: [name] MD Entered By: [name] on 02/17/2021 08:35:27 -------------------------------------------------------------------------------- SuperBill Details Patient Name: Date of Service: [name], [name] 02/17/2021 Medical Record Number: [id number] Patient Account Number: [id number] Date of Birth/Sex: Treating RN: 1985/07/14 (35 y.o. [name] Primary Care Provider: [name] Other Clinician: Referring Provider: Treating Provider/Extender: [name] in Treatment: 68 Diagnosis Coding ICD-10 Codes Code Description E11.621 Type 2 diabetes mellitus with foot ulcer L97.528 Non-pressure chronic ulcer of other part of left foot with other specified severity Facility Procedures CPT4 Code: [phone] Description: Application of clubfoot cast with molding or manipulation, long or short leg Modifier: Quantity: 1 Physician Procedures : CPT4 Code Description Modifier [id number] - WC PHYS LEVEL 3 - EST PT ICD-10 Diagnosis Description E11.621 Type 2 diabetes mellitus with foot ulcer  L97.528 Non-pressure chronic ulcer of other part of left foot with other specified severity Quantity: 1 Electronic Signature(s) Signed: 02/18/2021 1:42:03 PM By: [name] MD Entered By: [name] on 02/17/2021 08:36:02

## 2021-02-18 NOTE — Progress Notes (Signed)
[name], [name] ([id number]) Visit Report for 02/17/2021 Arrival Information Details Patient Name: Date of Service: [name] [name] 02/17/2021 7:30 A M Medical Record Number: [id number] Patient Account Number: [id number] Date of Birth/Sex: Treating RN: [birth year] (35 y.o. [name] Primary Care [name] [name] in Treatment: 40 Visit Information History Since Last Visit Added or deleted any medications: No Patient Arrived: Ambulatory Any new allergies or adverse reactions: No Arrival Time: 07:47 Had a fall or experienced change in No Accompanied By: alone activities of daily living that may affect Transfer Assistance: None risk of falls: Patient Identification Verified: Yes Signs or symptoms of abuse/neglect since last visito No Secondary Verification Process Completed: Yes Hospitalized since last visit: No Patient Requires Transmission-Based Precautions: No Implantable device outside of the clinic excluding No Patient Has Alerts: No cellular tissue based products placed in the center since last visit: Has Dressing in Place as Prescribed: Yes Has Compression in Place as Prescribed: Yes Pain Present Now: No Electronic Signature(s) Signed: 02/18/2021 5:23:58 PM By: Max Hurst RN, BSN Entered By: [name] on 02/17/2021 07:47:38 -------------------------------------------------------------------------------- Encounter Discharge Information Details Patient Name: Date of Service: [name], [name] 02/17/2021 7:30 A M Medical Record Number: [id number] Patient Account Number: [id number] Date of Birth/Sex: Treating RN: 09/26/1985 (35 y.o. [name] Primary Care Mamadou Breon: [name] Other Clinician: Referring [name]: Treating [name]/Extender: [name] in Treatment: 59 Encounter Discharge Information  Items Discharge Condition: Stable Ambulatory Status: Ambulatory Discharge Destination: Home Transportation: Ambulance Accompanied By: alone Schedule Follow-up Appointment: Yes Clinical Summary of Care: Patient Declined Electronic Signature(s) Signed: 02/18/2021 5:23:58 PM By: Max Hurst RN, BSN Entered By: [name] on 02/17/2021 11:09:13 -------------------------------------------------------------------------------- Lower Extremity Assessment Details Patient Name: Date of Service: [name], [name] 02/17/2021 7:30 A M Medical Record Number: [id number] Patient Account Number: [id number] Date of Birth/Sex: Treating RN: 1985-05-09 (35 y.o. [name] Primary Care [name]: [name] Other Clinician: Referring Lorri Fukuhara: Treating [name]/Extender: [name] in Treatment: 68 Edema Assessment Assessed: [name]: No] [name]: No] Edema: [Left: Ye] [Right: s] Calf Left: Right: Point of Measurement: 48 cm From Medial Instep 49.5 cm Ankle Left: Right: Point of Measurement: 11 cm From Medial Instep 31 cm Vascular Assessment Pulses: Dorsalis Pedis Palpable: [Left:Yes] Electronic Signature(s) Signed: 02/18/2021 5:23:58 PM By: Max Hurst RN, BSN Entered By: [name] on 02/17/2021 07:58:56 -------------------------------------------------------------------------------- Multi Wound Chart Details Patient Name: Date of Service: [name], [name] 02/17/2021 7:30 A M Medical Record Number: [id number] Patient Account Number: [id number] Date of Birth/Sex: Treating RN: [birth year] (35 y.o. [name] Primary Care [name]: [name] Other Clinician: Referring [name]: Treating [name]/Extender: [name] in Treatment: 68 Vital Signs Height(in): 77 Capillary Blood Glucose(mg/dl): 138 Weight(lbs): 280 Pulse(bpm): 90 Body Mass Index(BMI): 33 Blood Pressure(mmHg):  161/92 Temperature(F): 98.6 Respiratory Rate(breaths/min): 18 Photos: [N/A:N/A] Left, Lateral Foot N/A N/A Wound Location: Trauma N/A N/A Wounding Event: Diabetic Wound/Ulcer of the Lower N/A N/A Primary Etiology: Extremity Type II Diabetes N/A N/A Comorbid History: 10/02/2019 N/A N/A Date Acquired: 63 N/A N/A Weeks of Treatment: Open N/A N/A Wound Status: 3.2x2.5x0.4 N/A N/A Measurements L x W x [name] 6.283 N/A N/A A (cm) : rea 2.513 N/A N/A Volume (cm) : -281.00% N/A N/A % Reduction in A rea: -1423.00% N/A N/A % Reduction in Volume: Grade 2 N/A N/A Classification: Large N/A N/A Exudate A mount:  Serosanguineous N/A N/A Exudate Type: red, brown N/A N/A Exudate Color: Epibole N/A N/A Wound Margin: Large (67-100%) N/A N/A Granulation A mount: Red, Pink N/A N/A Granulation Quality: Small (1-33%) N/A N/A Necrotic A mount: Fat Layer (Subcutaneous Tissue): Yes N/A N/A Exposed Structures: Fascia: No Tendon: No Muscle: No Joint: No Bone: No Small (1-33%) N/A N/A Epithelialization: Soft Cast N/A N/A Procedures Performed: Treatment Notes Electronic Signature(s) Signed: 02/18/2021 1:42:03 PM By: Max Ham MD Signed: 02/18/2021 5:23:58 PM By: Max Hurst RN, BSN Entered By: [name] on 02/17/2021 08:13:20 -------------------------------------------------------------------------------- Multi-Disciplinary Care Plan Details Patient Name: Date of Service: [name], [name] 02/17/2021 7:30 A M Medical Record Number: [id number] Patient Account Number: [id number] Date of Birth/Sex: Treating RN: [birth year] (35 y.o. [name] Primary Care [name]: [name] Other Clinician: Referring [name]: Treating [name]/Extender: [name] in Treatment: 68 Multidisciplinary Care Plan reviewed with physician Active Inactive Nutrition Nursing Diagnoses: Imbalanced nutrition Potential for alteratiion in  Nutrition/Potential for imbalanced nutrition Goals: Patient/caregiver agrees to and verbalizes understanding of need to use nutritional supplements and/or vitamins as prescribed Date Initiated: 10/24/2019 Date Inactivated: 04/06/2020 Target Resolution Date: 04/03/2020 Goal Status: Met Patient/caregiver will maintain therapeutic glucose control Date Initiated: 10/24/2019 Target Resolution Date: 03/02/2021 Goal Status: Active Interventions: Assess HgA1c results as ordered upon admission and as needed Assess patient nutrition upon admission and as needed per policy Provide education on elevated blood sugars and impact on wound healing Provide education on nutrition Treatment Activities: Education provided on Nutrition : 12/02/2020 Notes: 11/17/20: Glucose control ongoing issue, target date extended. 01/26/21: Glucose management continues. Wound/Skin Impairment Nursing Diagnoses: Impaired tissue integrity Knowledge deficit related to ulceration/compromised skin integrity Goals: Patient/caregiver will verbalize understanding of skin care regimen Date Initiated: 10/24/2019 Target Resolution Date: 03/02/2021 Goal Status: Active Ulcer/skin breakdown will have a volume reduction of 30% by week 4 Date Initiated: 10/24/2019 Date Inactivated: 01/16/2020 Target Resolution Date: 01/10/2020 Unmet Reason: no change in Goal Status: Unmet measurements. Interventions: Assess patient/caregiver ability to obtain necessary supplies Assess patient/caregiver ability to perform ulcer/skin care regimen upon admission and as needed Assess ulceration(s) every visit Provide education on ulcer and skin care Notes: 11/17/20: Wound care regimen continues Electronic Signature(s) Signed: 02/18/2021 5:23:58 PM By: Max Hurst RN, BSN Entered By: [name] on 02/17/2021 08:04:53 -------------------------------------------------------------------------------- Pain Assessment Details Patient Name: Date of  Service: [name], [name] 02/17/2021 7:30 A M Medical Record Number: [id number] Patient Account Number: [id number] Date of Birth/Sex: Treating RN: 02/01/1986 (35 y.o. [name] Primary Care Esiquio Boesen: [name] Other Clinician: Referring Isley Zinni: Treating Bertin Inabinet/Extender: [name] in Treatment: 68 Active Problems Location of Pain Severity and Description of Pain Patient Has Paino No Site Locations Pain Management and Medication Current Pain Management: Electronic Signature(s) Signed: 02/18/2021 5:23:58 PM By: Max Hurst RN, BSN Entered By: [name] on 02/17/2021 07:48:35 -------------------------------------------------------------------------------- Patient/Caregiver Education Details Patient Name: Date of Service: [name], [name] 11/16/2022andnbsp7:30 A M Medical Record Number: [id number] Patient Account Number: [id number] Date of Birth/Gender: Treating RN: [birth year] (35 y.o. [name] Primary Care Physician: [name] Other Clinician: Referring Physician: Treating Physician/Extender: [name] in Treatment: 44 Education Assessment Education Provided To: Patient Education Topics Provided Wound/Skin Impairment: Methods: Explain/Verbal Responses: State content correctly [employer]) Signed: 02/18/2021 5:23:58 PM By: Max Hurst RN, BSN Entered By: [name] on 02/17/2021 08:05:06 -------------------------------------------------------------------------------- Wound Assessment Details Patient Name: Date of Service: [name], [name] 02/17/2021 7:30  A M Medical Record Number: [id number] Patient Account Number: [id number] Date of Birth/Sex: Treating RN: 12/05/1985 (35 y.o. [name] Primary Care Franci Oshana: [name] Other Clinician: Referring Thaddius Manes: Treating Arian Mcquitty/Extender: [name] in Treatment:  68 Wound Status Wound Number: 3 Primary Etiology: Diabetic Wound/Ulcer of the Lower Extremity Wound Location: Left, Lateral Foot Wound Status: Open Wounding Event: Trauma Comorbid History: Type II Diabetes Date Acquired: 10/02/2019 Weeks Of Treatment: 68 Clustered Wound: No Photos Wound Measurements Length: (cm) 3.2 Width: (cm) 2.5 Depth: (cm) 0.4 Area: (cm) 6.283 Volume: (cm) 2.513 % Reduction in Area: -281% % Reduction in Volume: -1423% Epithelialization: Small (1-33%) Tunneling: No Undermining: No Wound Description Classification: Grade 2 Wound Margin: Epibole Exudate Amount: Large Exudate Type: Serosanguineous Exudate Color: red, brown Foul Odor After Cleansing: No Slough/Fibrino Yes Wound Bed Granulation Amount: Large (67-100%) Exposed Structure Granulation Quality: Red, Pink Fascia Exposed: No Necrotic Amount: Small (1-33%) Fat Layer (Subcutaneous Tissue) Exposed: Yes Necrotic Quality: Adherent Slough Tendon Exposed: No Muscle Exposed: No Joint Exposed: No Bone Exposed: No Treatment Notes Wound #3 (Foot) Wound Laterality: Left, Lateral Cleanser Soap and Water Discharge Instruction: May shower and wash wound with dial antibacterial soap and water prior to dressing change. Wound Cleanser Discharge Instruction: Cleanse the wound with wound cleanser prior to applying a clean dressing using gauze sponges, not tissue or cotton balls. Peri-Wound Care Zinc Oxide Ointment 30g tube Discharge Instruction: Apply Zinc Oxide to periwound with each dressing change as needed. Topical keystone antibiotic compound Discharge Instruction: thin layer to wound bed Primary Dressing KerraCel Ag Gelling Fiber Dressing, 4x5 in (silver alginate) Discharge Instruction: Apply silver alginate to wound bed as instructed Secondary Dressing ABD Pad, 8x10 Discharge Instruction: Apply over primary dressing as directed. Zetuvit Plus 4x8 in Discharge Instruction: Apply over primary  dressing as directed. Secured With Compression Wrap Kerlix Roll 4.5x3.1 (in/yd) Discharge Instruction: Apply Kerlix and Coban compression as directed. Coban Self-Adherent Wrap 4x5 (in/yd) Discharge Instruction: Apply over Kerlix as directed. Compression Stockings Add-Ons Electronic Signature(s) Signed: 02/18/2021 5:23:58 PM By: Max Hurst RN, BSN Entered By: [name] on 02/17/2021 08:00:26 -------------------------------------------------------------------------------- [place] Details Patient Name: Date of Service: [name], [name] 02/17/2021 7:30 A M Medical Record Number: [id number] Patient Account Number: [id number] Date of Birth/Sex: Treating RN: 28-Apr-1985 (35 y.o. [name] Primary Care Kyron Schlitt: [name] Other Clinician: Referring Ashtan Girtman: Treating Clemon Devaul/Extender: [name] in Treatment: 68 Vital Signs Time Taken: 07:47 Temperature (F): 98.6 Height (in): 77 Pulse (bpm): 90 Weight (lbs): 280 Respiratory Rate (breaths/min): 18 Body Mass Index (BMI): 33.2 Blood Pressure (mmHg): 161/92 Capillary Blood Glucose (mg/dl): 138 Reference Range: 80 - 120 mg / dl Notes glucose per pt report Electronic Signature(s) Signed: 02/18/2021 5:23:58 PM By: Max Hurst RN, BSN Entered By: [name] on 02/17/2021 07:50:17

## 2021-02-19 ENCOUNTER — Encounter (HOSPITAL_BASED_OUTPATIENT_CLINIC_OR_DEPARTMENT_OTHER): Payer: BC Managed Care – PPO | Admitting: Internal Medicine

## 2021-02-19 ENCOUNTER — Other Ambulatory Visit: Payer: Self-pay

## 2021-02-19 DIAGNOSIS — E11621 Type 2 diabetes mellitus with foot ulcer: Secondary | ICD-10-CM | POA: Diagnosis not present

## 2021-02-19 NOTE — Progress Notes (Signed)
[name], Max ([id number]) Visit Report for 02/19/2021 Arrival Information Details Patient Name: Date of Service: [name] D 02/19/2021 7:30 A M Medical Record Number: [id number] Patient Account Number: [id number] Date of Birth/Sex: Treating RN: 07-18-1985 (35 y.o. [name] Primary Care Tishia Maestre: [name] Other Clinician: Referring Clydine Parkison: Treating Knut Rondinelli/Extender: [name] in Treatment: 52 Visit Information History Since Last Visit Added or deleted any medications: No Patient Arrived: Ambulatory Any new allergies or adverse reactions: No Arrival Time: 07:50 Had a fall or experienced change in No Accompanied By: alone activities of daily living that may affect Transfer Assistance: None risk of falls: Patient Identification Verified: Yes Signs or symptoms of abuse/neglect since last visito No Secondary Verification Process Completed: Yes Hospitalized since last visit: No Patient Requires Transmission-Based Precautions: No Implantable device outside of the clinic excluding No Patient Has Alerts: No cellular tissue based products placed in the center since last visit: Has Dressing in Place as Prescribed: Yes Has Compression in Place as Prescribed: Yes Pain Present Now: No Electronic Signature(s) Signed: 02/19/2021 12:22:34 PM By: [name] RN, BSN Entered By: [name] on 02/19/2021 09:31:35 -------------------------------------------------------------------------------- Encounter Discharge Information Details Patient Name: Date of Service: [name], [name] 02/19/2021 7:30 A M Medical Record Number: [id number] Patient Account Number: [id number] Date of Birth/Sex: Treating RN: [birth year] (35 y.o. [name] Primary Care [name]: [name] Other Clinician: Referring Jalyssa Fleisher: Treating [name]/Extender: [name] in Treatment: 14 Encounter Discharge Information  Items Discharge Condition: Stable Ambulatory Status: Ambulatory Discharge Destination: Home Transportation: Private Auto Accompanied By: alone Schedule Follow-up Appointment: Yes Clinical Summary of Care: Patient Declined Electronic Signature(s) Signed: 02/19/2021 12:22:34 PM By: [name] RN, BSN Entered By: [name] on 02/19/2021 09:36:25 -------------------------------------------------------------------------------- Wound Assessment Details Patient Name: Date of Service: [name], [name] 02/19/2021 7:30 A M Medical Record Number: [id number] Patient Account Number: [id number] Date of Birth/Sex: Treating RN: [birth year] (35 y.o. [name] Primary Care [name]: [name] Other Clinician: Referring [name]: Treating Breana Litts/Extender: [name] in Treatment: 69 Wound Status Wound Number: 3 Primary Etiology: Diabetic Wound/Ulcer of the Lower Extremity Wound Location: Left, Lateral Foot Wound Status: Open Wounding Event: Trauma Comorbid History: Type II Diabetes Date Acquired: 10/02/2019 Weeks Of Treatment: 69 Clustered Wound: No Wound Measurements Length: (cm) 3.2 Width: (cm) 2.5 Depth: (cm) 0.4 Area: (cm) 6.283 Volume: (cm) 2.513 % Reduction in Area: -281% % Reduction in Volume: -1423% Epithelialization: Small (1-33%) Tunneling: No Undermining: No Wound Description Classification: Grade 2 Wound Margin: Epibole Exudate Amount: Large Exudate Type: Serosanguineous Exudate Color: red, brown Foul Odor After Cleansing: No Slough/Fibrino Yes Wound Bed Granulation Amount: Large (67-100%) Exposed Structure Granulation Quality: Red, Pink Fascia Exposed: No Necrotic Amount: Small (1-33%) Fat Layer (Subcutaneous Tissue) Exposed: Yes Necrotic Quality: Adherent Slough Tendon Exposed: No Muscle Exposed: No Joint Exposed: No Bone Exposed: No Treatment Notes Wound #3 (Foot) Wound Laterality: Left,  Lateral Cleanser Soap and Water Discharge Instruction: May shower and wash wound with dial antibacterial soap and water prior to dressing change. Wound Cleanser Discharge Instruction: Cleanse the wound with wound cleanser prior to applying a clean dressing using gauze sponges, not tissue or cotton balls. Peri-Wound Care Zinc Oxide Ointment 30g tube Discharge Instruction: Apply Zinc Oxide to periwound with each dressing change as needed. Topical keystone antibiotic compound Discharge Instruction: thin layer to wound bed Primary Dressing KerraCel Ag Gelling Fiber Dressing, 4x5 in (silver alginate) Discharge Instruction: Apply silver alginate  to wound bed as instructed Secondary Dressing ABD Pad, 8x10 Discharge Instruction: Apply over primary dressing as directed. Zetuvit Plus 4x8 in Discharge Instruction: Apply over primary dressing as directed. Secured With Compression Wrap Kerlix Roll 4.5x3.1 (in/yd) Discharge Instruction: Apply Kerlix and Coban compression as directed. Coban Self-Adherent Wrap 4x5 (in/yd) Discharge Instruction: Apply over Kerlix as directed. Compression Stockings Add-Ons Electronic Signature(s) Signed: 02/19/2021 12:22:34 PM By: Zandra Abts RN, BSN Entered By: Zandra Abts on 02/19/2021 09:35:30 -------------------------------------------------------------------------------- Vitals Details Patient Name: Date of Service: [name], [name] 02/19/2021 7:30 A M Medical Record Number: [id number] Patient Account Number: [id number] Date of Birth/Sex: Treating RN: [birth year] (35 y.o. [name] Primary Care Keani Gotcher: [name] Other Clinician: Referring Aliviah Spain: Treating Glenda Spelman/Extender: Dione Plover in Treatment: 62 Vital Signs Time Taken: 07:50 Temperature (F): 98.6 Height (in): 77 Pulse (bpm): 96 Weight (lbs): 280 Respiratory Rate (breaths/min): 18 Body Mass Index (BMI): 33.2 Blood Pressure (mmHg):  168/88 Capillary Blood Glucose (mg/dl): 097 Reference Range: 80 - 120 mg / dl Electronic Signature(s) Signed: 02/19/2021 12:22:34 PM By: Zandra Abts RN, BSN Entered By: Zandra Abts on 02/19/2021 09:32:05

## 2021-02-19 NOTE — Progress Notes (Signed)
[place], [name] ([id number]) Visit Report for 02/19/2021 Soft [place] Details Patient Name: Date of Service: [name] D 02/19/2021 7:30 A M Medical Record Number: [id number] Patient Account Number: [id number] Date of Birth/Sex: Treating RN: [birth year] (35 y.o. [name] Primary Care Provider: [name] Other Clinician: Referring Provider: Treating Provider/Extender: Dione Plover in Treatment: 76 Procedure Performed for: Wound #3 Left,Lateral Foot Performed By: Clinician Zandra Abts, RN Electronic Signature(s) Signed: 02/19/2021 9:57:21 AM By: Geralyn Corwin DO Signed: 02/19/2021 12:22:34 PM By: Zandra Abts RN, BSN Entered By: Zandra Abts on 02/19/2021 09:35:45 -------------------------------------------------------------------------------- [place] Details Patient Name: Date of Service: [name], [name] 02/19/2021 Medical Record Number: [id number] Patient Account Number: [id number] Date of Birth/Sex: Treating RN: [birth year] (35 y.o. 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

## 2021-02-24 ENCOUNTER — Other Ambulatory Visit: Payer: Self-pay

## 2021-02-24 ENCOUNTER — Encounter (HOSPITAL_BASED_OUTPATIENT_CLINIC_OR_DEPARTMENT_OTHER): Payer: BC Managed Care – PPO | Admitting: Physician Assistant

## 2021-02-24 NOTE — Progress Notes (Signed)
[name], [name] ([id number]) Visit Report for 02/24/2021 Arrival Information Details Patient Name: Date of Service: [name] 02/24/2021 9:30 A M Medical Record Number: [id number] Patient Account Number: [id number] Date of Birth/Sex: Treating RN: January 21, 1986 (35 y.o. [name], [name] Primary Care Canisha Issac: [name] Other Clinician: Referring Alwaleed Obeso: Treating Berdie Malter/Extender: Jennye Moccasin in Treatment: 76 Visit Information History Since Last Visit Added or deleted any medications: No Patient Arrived: Ambulatory Any new allergies or adverse reactions: No Arrival Time: 09:48 Had a fall or experienced change in No Accompanied By: self activities of daily living that may affect Transfer Assistance: None risk of falls: Patient Identification Verified: Yes Signs or symptoms of abuse/neglect since last visito No Secondary Verification Process Completed: Yes Hospitalized since last visit: No Patient Requires Transmission-Based Precautions: No Implantable device outside of the clinic excluding No Patient Has Alerts: No cellular tissue based products placed in the center since last visit: Has Dressing in Place as Prescribed: Yes Pain Present Now: No Electronic Signature(s) Signed: 02/24/2021 3:00:15 PM By: Rhae Hammock RN Entered By: Rhae Hammock on 02/24/2021 09:49:01 -------------------------------------------------------------------------------- Encounter Discharge Information Details Patient Name: Date of Service: [name], [name] 02/24/2021 9:30 A M Medical Record Number: [id number] Patient Account Number: [id number] Date of Birth/Sex: Treating RN: [birth year] (35 y.o. [name] Primary Care Dearies Meikle: [name] Other Clinician: Referring Chaniyah Jahr: Treating Reneshia Zuccaro/Extender: Jennye Moccasin in Treatment: 41 Encounter Discharge Information Items Discharge Condition: Stable Ambulatory  Status: Ambulatory Discharge Destination: Home Transportation: Private Auto Schedule Follow-up Appointment: Yes Clinical Summary of Care: Provided on 02/24/2021 Form Type Recipient Paper Patient Patient Electronic Signature(s) Signed: 02/24/2021 4:59:20 PM By: [name] Entered By: [name] on 02/24/2021 11:09:06 -------------------------------------------------------------------------------- Lower Extremity Assessment Details Patient Name: Date of Service: [name] 02/24/2021 9:30 A M Medical Record Number: [id number] Patient Account Number: [id number] Date of Birth/Sex: Treating RN: 12-07-1985 (35 y.o. [name], [name] Primary Care Jaquez Farrington: [name] Other Clinician: Referring Ashwin Tibbs: Treating Marnee Sherrard/Extender: Jennye Moccasin in Treatment: 69 Edema Assessment Assessed: [Left: Yes] [name]: No] Edema: [Left: Ye] [Right: s] Calf Left: Right: Point of Measurement: 48 cm From Medial Instep 49.5 cm Ankle Left: Right: Point of Measurement: 11 cm From Medial Instep 31 cm Vascular Assessment Pulses: Dorsalis Pedis Palpable: [Left:Yes] Posterior Tibial Palpable: [Left:Yes] Electronic Signature(s) Signed: 02/24/2021 3:00:15 PM By: Rhae Hammock RN Entered By: Rhae Hammock on 02/24/2021 10:01:10 -------------------------------------------------------------------------------- [place] Details Patient Name: Date of Service: [name], [name] 02/24/2021 9:30 A M Medical Record Number: [id number] Patient Account Number: [id number] Date of Birth/Sex: Treating RN: Jul 12, 1985 (35 y.o. [name], [name] Primary Care Naevia Unterreiner: [name] Other Clinician: Referring Nell Gales: Treating Aurelio Mccamy/Extender: Jennye Moccasin in Treatment: [address] [place] reviewed with physician Active Inactive Nutrition Nursing Diagnoses: Imbalanced nutrition Potential for  alteratiion in Nutrition/Potential for imbalanced nutrition Goals: Patient/caregiver agrees to and verbalizes understanding of need to use nutritional supplements and/or vitamins as prescribed Date Initiated: 10/24/2019 Date Inactivated: 04/06/2020 Target Resolution Date: 04/03/2020 Goal Status: Met Patient/caregiver will maintain therapeutic glucose control Date Initiated: 10/24/2019 Target Resolution Date: 03/02/2021 Goal Status: Active Interventions: Assess HgA1c results as ordered upon admission and as needed Assess patient nutrition upon admission and as needed per policy Provide education on elevated blood sugars and impact on wound healing Provide education on nutrition Treatment Activities: Education provided on Nutrition : 11/24/2020 Notes: 11/17/20: Glucose control ongoing issue, target  date extended. 01/26/21: Glucose management continues. Wound/Skin Impairment Nursing Diagnoses: Impaired tissue integrity Knowledge deficit related to ulceration/compromised skin integrity Goals: Patient/caregiver will verbalize understanding of skin care regimen Date Initiated: 10/24/2019 Target Resolution Date: 03/02/2021 Goal Status: Active Ulcer/skin breakdown will have a volume reduction of 30% by week 4 Date Initiated: 10/24/2019 Date Inactivated: 01/16/2020 Target Resolution Date: 01/10/2020 Unmet Reason: no change in Goal Status: Unmet measurements. Interventions: Assess patient/caregiver ability to obtain necessary supplies Assess patient/caregiver ability to perform ulcer/skin care regimen upon admission and as needed Assess ulceration(s) every visit Provide education on ulcer and skin care Notes: 11/17/20: Wound care regimen continues Electronic Signature(s) Signed: 02/24/2021 3:00:15 PM By: Rhae Hammock RN Entered By: Rhae Hammock on 02/24/2021 10:48:38 -------------------------------------------------------------------------------- Pain Assessment Details Patient  Name: Date of Service: [name], [name] 02/24/2021 9:30 A M Medical Record Number: [id number] Patient Account Number: [id number] Date of Birth/Sex: Treating RN: 11/23/1985 (35 y.o. [name], [name] Primary Care Avier Jech: [name] Other Clinician: Referring Iqra Rotundo: Treating Zeena Starkel/Extender: Jennye Moccasin in Treatment: 21 Active Problems Location of Pain Severity and Description of Pain Patient Has Paino No Site Locations Pain Management and Medication Current Pain Management: Electronic Signature(s) Signed: 02/24/2021 3:00:15 PM By: Rhae Hammock RN Entered By: Rhae Hammock on 02/24/2021 09:51:26 -------------------------------------------------------------------------------- Wound Assessment Details Patient Name: Date of Service: [name], [name] 02/24/2021 9:30 A M Medical Record Number: [id number] Patient Account Number: [id number] Date of Birth/Sex: Treating RN: [birth year] (35 y.o. M) Primary Care Lynze Reddy: [name] Other Clinician: Referring Sadira Standard: Treating Yulonda Wheeling/Extender: Jennye Moccasin in Treatment: 69 Wound Status Wound Number: 3 Primary Etiology: Diabetic Wound/Ulcer of the Lower Extremity Wound Location: Left, Lateral Foot Wound Status: Open Wounding Event: Trauma Comorbid History: Type II Diabetes Date Acquired: 10/02/2019 Weeks Of Treatment: 69 Clustered Wound: No Photos Wound Measurements Length: (cm) 2.8 Width: (cm) 2.2 Depth: (cm) 0.4 Area: (cm) 4.838 Volume: (cm) 1.935 % Reduction in Area: -193.4% % Reduction in Volume: -1072.7% Epithelialization: Small (1-33%) Tunneling: No Undermining: No Wound Description Classification: Grade 2 Wound Margin: Epibole Exudate Amount: Large Exudate Type: Serosanguineous Exudate Color: red, brown Foul Odor After Cleansing: No Slough/Fibrino Yes Wound Bed Granulation Amount: Large (67-100%) Exposed Structure Granulation  Quality: Red, Pink Fascia Exposed: No Necrotic Amount: Small (1-33%) Fat Layer (Subcutaneous Tissue) Exposed: Yes Necrotic Quality: Adherent Slough Tendon Exposed: No Muscle Exposed: No Joint Exposed: No Bone Exposed: No Treatment Notes Wound #3 (Foot) Wound Laterality: Left, Lateral Cleanser Soap and Water Discharge Instruction: May shower and wash wound with dial antibacterial soap and water prior to dressing change. Wound Cleanser Discharge Instruction: Cleanse the wound with wound cleanser prior to applying a clean dressing using gauze sponges, not tissue or cotton balls. Peri-Wound Care Zinc Oxide Ointment 30g tube Discharge Instruction: Apply Zinc Oxide to periwound with each dressing change as needed. Topical keystone antibiotic compound Discharge Instruction: thin layer to wound bed Primary Dressing KerraCel Ag Gelling Fiber Dressing, 4x5 in (silver alginate) Discharge Instruction: Apply silver alginate to wound bed as instructed Secondary Dressing ABD Pad, 8x10 Discharge Instruction: Apply over primary dressing as directed. Zetuvit Plus 4x8 in Discharge Instruction: Apply over primary dressing as directed. Secured With Compression Wrap Kerlix Roll 4.5x3.1 (in/yd) Discharge Instruction: Apply Kerlix and Coban compression as directed. Coban Self-Adherent Wrap 4x5 (in/yd) Discharge Instruction: Apply over Kerlix as directed. Compression Stockings Add-Ons Electronic Signature(s) Signed: 02/24/2021 5:41:23 PM By: Dellie Catholic RN Entered By: Dellie Catholic on 02/24/2021 10:18:06 --------------------------------------------------------------------------------  Vitals Details Patient Name: Date of Service: [name] 02/24/2021 9:30 A M Medical Record Number: [id number] Patient Account Number: [id number] Date of Birth/Sex: Treating RN: Aug 16, 1985 (35 y.o. [name], [name] Primary Care Leaf [employer]: [name] Other Clinician: Referring  Mariel Gaudin: Treating Keshona Kartes/Extender: Jennye Moccasin in Treatment: 53 Vital Signs Time Taken: 09:49 Temperature (F): 98.7 Height (in): 77 Pulse (bpm): 74 Weight (lbs): 280 Respiratory Rate (breaths/min): 17 Body Mass Index (BMI): 33.2 Blood Pressure (mmHg): 150/101 Capillary Blood Glucose (mg/dl): 136 Reference Range: 80 - 120 mg / dl Electronic Signature(s) Signed: 02/24/2021 3:00:15 PM By: Rhae Hammock RN Entered By: Rhae Hammock on 02/24/2021 09:51:18

## 2021-03-01 ENCOUNTER — Other Ambulatory Visit: Payer: Self-pay

## 2021-03-01 ENCOUNTER — Encounter (HOSPITAL_BASED_OUTPATIENT_CLINIC_OR_DEPARTMENT_OTHER): Payer: BC Managed Care – PPO | Admitting: Internal Medicine

## 2021-03-01 DIAGNOSIS — E11621 Type 2 diabetes mellitus with foot ulcer: Secondary | ICD-10-CM | POA: Diagnosis not present

## 2021-03-01 NOTE — Progress Notes (Addendum)
[name], Max ([id number]) Visit Report for 02/24/2021 Chief Complaint Document Details Patient Name: Date of Service: [name] D 02/24/2021 9:30 A M Medical Record Number: [id number] Patient Account Number: [id number] Date of Birth/Sex: Treating RN: [birth year] (35 y.o. M) Primary Care Provider: [name] Other Clinician: Referring Provider: Treating Provider/Extender: Jennye Moccasin in Treatment: 24 Information Obtained from: Patient Chief Complaint 01/11/2019; patient is here for review of a rather substantial wound over the left fifth plantar metatarsal head extending into the lateral part of his foot 10/24/2019; patient returns to clinic with wounds on his bilateral feet with underlying osteomyelitis biopsy-proven Electronic Signature(s) Signed: 02/24/2021 10:18:52 AM By: Worthy Keeler PA-C Entered By: Worthy Keeler on 02/24/2021 10:18:52 -------------------------------------------------------------------------------- HPI Details Patient Name: Date of Service: [name], [name] 02/24/2021 9:30 A M Medical Record Number: [id number] Patient Account Number: [id number] Date of Birth/Sex: Treating RN: 1985/09/24 (35 y.o. 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 He comes in for review of a wound over the plantar fifth metatarsal head extending into the lateral part of the foot. He was followed for this previously by his podiatrist Dr. Cornelius Moras. As the patient tells his story he went to see podiatry first for a swelling he developed on the lateral part of his fifth metatarsal head in May. He states this was "open" by podiatry and the area closed. He was followed up in June and it was  again opened callus removed and it closed promptly. There were plans being made for surgery on the fifth metatarsal head in June however his blood sugar was apparently too high for anesthesia. Apparently the area was debrided and opened again in June and it is never closed since. Looking over the records from podiatry I am really not able to follow this. It was clear when he was first seen it was before 5/14 at that point he already had a wound. By 5/17 the ulcer was resolved. I do not see anything about a procedure. On 5/28 noted to have pre-ulcerative moderate keratosis. X-ray noted 1/5 contracted toe and tailor's bunion and metatarsal deformity. On a visit date on 09/28/2018 the dorsal part of the left foot it healed and resolved. There was concern about swelling in his lower extremity he was sent to the ER.. As far as I can tell he was seen in the ER on 7/12 with an ulcer on his left foot. A DVT rule out of the left leg was negative. I do not think I have complete records from podiatry but I am not able to verify the procedures this patient states he had. He states after the last procedure the wound has never closed although I am not able to follow this in the records I have from podiatry. He has not had a recent x-ray The patient has been using Neosporin on the wound. He is wearing a Darco shoe. He is still very active up on his foot working and exercising. Past medical history; type 2 diabetes ketosis-prone, leg swelling with a negative DVT study in July. Non-smoker ABI in our clinic was 0.85 on the left 10/16; substantial wound on the plantar left fifth met head extending laterally almost to the dorsal fifth MTP. We have been using silver alginate we  gave him a Darco forefoot off loader. An x-ray did not show evidence of osteomyelitis did note soft tissue emphysema which I think was due to gas tracking through an open wound. There is no doubt in my mind he requires an MRI 10/23; MRI not booked  until 3 November at the earliest this is largely due to his glucose sensor in the right arm. We have been using silver alginate. There has been an improvement 10/29; I am still not exactly sure when his MRI is booked for. He says it is the third but it is the 10th in epic. This definitely needs to be done. He is running a low-grade fever today but no other symptoms. No real improvement in the 1 02/26/2019 patient presents today for a follow-up visit here in our clinic he is last been seen in the clinic on October 29. Subsequently we were working on getting MRI to evaluate and see what exactly was going on and where we would need to go from the standpoint of whether or not he had osteomyelitis and again what treatments were going be required. Subsequently the patient ended up being admitted to the hospital on 02/07/2019 and was discharged on 02/14/2019. This is a somewhat interesting admission with a discharge diagnosis of pneumonia due to COVID-19 although he was positive for COVID-19 when tested at the urgent care but negative x2 when he was actually in the hospital. With that being said he did have acute respiratory failure with hypoxia and it was noted he also have a left foot ulceration with osteomyelitis. With that being said he did require oxygen for his pneumonia and I level 4 L. He was placed on antivirals and steroids for the COVID-19. He was also transferred to the [facility] at one point. Nonetheless he did subsequently discharged home and since being home has done much better in that regard. The CT angiogram did not show any pulmonary embolism. With regard to the osteomyelitis the patient was placed on vancomycin and Zosyn while in the hospital but has been changed to Augmentin at discharge. It was also recommended that he follow- up with wound care and podiatry. Podiatry however wanted him to see [name] according to the patient prior to them doing anything further. His hemoglobin A1c  was 9.9 as noted in the hospital. Have an MRI of the left foot performed while in the hospital on 02/04/2019. 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 There is an overlying plantar open wound noted an abscess tracking back along the lateral aspect of the fifth metatarsal shaft. There is otherwise diffuse cellulitis and mild fasciitis without findings of polymyositis. The patient did have recently pneumonia secondary to COVID-19 I looked in the chart through epic and it does appear that the patient may need to have an additional x-ray just to ensure everything is cleared and that he has no airspace disease prior to putting him into the [name]. 03/05/2019; patient was readmitted to the clinic last week. He was hospitalized twice for a viral upper respiratory tract infection from 11/1 through 11/4 and then 11/5 through 11/12 ultimately this turned out to be Covid pneumonitis. Although he was discharged on oxygen he is not using it. He says he feels fine. He has no exercise limitation no cough no sputum. His O2 sat in our clinic today was 100% on room air. He did manage to have his MRI which showed  septic arthritis at the fifth MTP joint and osteomyelitis involving the fifth metatarsal head and proximal phalanx. He received Vanco and Zosyn in the hospital and then was discharged on 2 weeks of Augmentin. I do not see any relevant cultures. He was supposed to follow-up with infectious disease but I do not see that he has an appointment. 12/8; patient saw Dr. Novella Olive of infectious disease last week. He felt that he had had adequate antibiotic therapy. He did not go to follow-up with Dr. Amalia Hailey of podiatry and I have again talked to him about the pros and cons of this. He does not want to consider a ray amputation of this time. He is aware of the risks of recurrence, migration etc. He started HBO today and tolerated  this well. He can complete the Augmentin that I gave him last week. I have looked over the lab work that Dr. Chana Bode ordered his C-reactive protein was 3.3 and his sedimentation rate was 17. The C-reactive protein is never really been measurably that high in this patient 12/15; not much change in the wound today however he has undermining along the lateral part of the foot again more extensively than last week. He has some rims of epithelialization. We have been using silver alginate. He is undergoing hyperbarics but did not dive today 12/18; in for his obligatory first total contact cast change. Unfortunately there was pus coming from the undermining area around his fifth metatarsal head. This was cultured but will preclude reapplication of a cast. He is seen in conjunction with HBO 12/24; patient had staph lugdunensis in the wound in the undermining area laterally last time. We put him on doxycycline which should have covered this. The wound looks better today. I am going to give him another week of doxycycline before reattempting the total contact cast 12/31; the patient is completing antibiotics. Hemorrhagic debris in the distal part of the wound with some undermining distally. He also had hyper granulation. Extensive debridement with a #5 curette. The infected area that was on the lateral part of the fifth met head is closed over. I do not think he needs any more antibiotics. Patient was seen prior to HBO. Preparations for a total contact cast were made in the cast will be placed post hyperbarics 04/11/19; once again the patient arrives today without complaint. He had been in a cast all week noted that he had heavy drainage this week. This resulted in large raised areas of macerated tissue around the wound 1/14; wound bed looks better slightly smaller. Hydrofera Blue has been changing himself. He had a heavy drainage last week which caused a lot of maceration around the wound so I took him out of a  total contact cast he says the drainage is actually better this week He is seen today in conjunction with HBO 1/21; returns to clinic. He was up in [place] for a day or 2 attending a funeral. He comes back in with the wound larger and with a large area of exposed bone. He had osteomyelitis and septic arthritis of the fifth left metatarsal head while he was in hospital. He received IV antibiotics in the hospital for a prolonged period of time then 3 weeks of Augmentin. Subsequently I gave him 2 weeks of doxycycline for more superficial wound infection. When I saw this last week the wound was smaller the surface of the wound looks satisfactory. 1/28; patient missed hyperbarics today. Bone biopsy I did last time showed Enterococcus faecalis and Staphylococcus lugdunensis .  He has a wide area of exposed bone. We are going to use silver alginate as of today. I had another ethical discussion with the patient. 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 In this situation I think the likelihood of healing this is low. Therefore I have recommended a ray amputation and with the patient's agreement I have referred him to Dr. Doran Durand. The other issue is that his compliance with hyperbarics has been minimal because of his work schedule and given his underlying decision I am going to stop this today READMISSION 10/24/2019 MRI 09/29/2019 left foot IMPRESSION: 1. Apparent skin ulceration inferior and lateral to the 5th metatarsal base with underlying heterogeneous T2 signal and enhancement in the subcutaneous fat. Small peripherally enhancing fluid collections along the plantar and lateral aspects of the 5th metatarsal base suspicious for abscesses. 2. Interval amputation through the mid 5th metatarsal with nonspecific low-level marrow edema and enhancement. Given the proximity to the adjacent soft tissue inflammatory changes, osteomyelitis cannot be excluded. 3. The  additional bones appear unremarkable. MRI 09/29/2019 right foot IMPRESSION: 1. Soft tissue ulceration lateral to the 5th MTP joint. There is low-level T2 hyperintensity within the 4th and 5th metatarsal heads and adjacent proximal phalanges without abnormal T1 signal or cortical destruction. These findings are nonspecific and could be seen with early marrow edema, hyperemia or early osteomyelitis. No evidence of septic joint. 2. Mild tenosynovitis and synovial enhancement associated with the extensor digitorum tendons at the level of the midfoot. 3. Diffuse low-level muscular T2 hyperintensity and enhancement, most consistent with diabetic myopathy. LEFT FOOT BONE Methicillin resistant staphylococcus aureus Staphylococcus lugdunensis MIC MIC CIPROFLOXACIN >=8 RESISTANT Resistant <=0.5 SENSI... Sensitive CLINDAMYCIN <=0.25 SENS... Sensitive >=8 RESISTANT Resistant ERYTHROMYCIN >=8 RESISTANT Resistant >=8 RESISTANT Resistant GENTAMICIN <=0.5 SENSI... Sensitive <=0.5 SENSI... Sensitive Inducible Clindamycin NEGATIVE Sensitive NEGATIVE Sensitive OXACILLIN >=4 RESISTANT Resistant 2 SENSITIVE Sensitive RIFAMPIN <=0.5 SENSI... Sensitive <=0.5 SENSI... Sensitive TETRACYCLINE <=1 SENSITIVE Sensitive <=1 SENSITIVE Sensitive TRIMETH/SULFA <=10 SENSIT Sensitive <=10 SENSIT Sensitive ... Marland Kitchen.. VANCOMYCIN 1 SENSITIVE Sensitive <=0.5 SENSI... Sensitive Right foot bone . Component 3 wk ago Specimen Description BONE Special Requests RIGHT 4 METATARSAL SAMPLE B Gram Stain NO WBC SEEN NO ORGANISMS SEEN Culture RARE METHICILLIN RESISTANT STAPHYLOCOCCUS AUREUS NO ANAEROBES ISOLATED Performed at [facility], [place] [address] [place], [place] [address] Report Status 10/08/2019 FINAL Organism ID, Bacteria METHICILLIN RESISTANT STAPHYLOCOCCUS AUREUS Resulting Agency [facility] Susceptibility Methicillin resistant staphylococcus aureus MIC CIPROFLOXACIN >=8 RESISTANT Resistant CLINDAMYCIN  <=0.25 SENS... Sensitive ERYTHROMYCIN >=8 RESISTANT Resistant GENTAMICIN <=0.5 SENSI... Sensitive Inducible Clindamycin NEGATIVE Sensitive OXACILLIN >=4 RESISTANT Resistant RIFAMPIN <=0.5 SENSI... Sensitive TETRACYCLINE <=1 SENSITIVE Sensitive TRIMETH/SULFA <=10 SENSIT Sensitive ... VANCOMYCIN 1 SENSITIVE Sensitive This is a patient we had in clinic earlier this year with a wound over his left fifth metatarsal head. 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 Doran Durand on 05/16/2019. He seems to have developed problems with wounds on his bilateral feet in June 2021 although he may have had problems earlier than this. He was in an urgent care with a right foot ulcer on 09/26/2019 and given a course of doxycycline. This was apparently after having trouble getting into see orthopedics. He was seen by podiatry on 09/28/2019  noted to have bilateral lower extremity ulcers including the left lateral fifth metatarsal base and the right subfifth met head. It was noted that had purulent drainage at that time. He required hospitalization from 6/20 through 7/2. This was because of worsening right foot wounds. He underwent bilateral operative incision and drainage and bone biopsies bilaterally. Culture results are listed above. He has been referred back to clinic by Dr. Jacqualyn Posey of podiatry. He is also followed by Dr. Megan Salon who saw him yesterday. He was discharged from hospital on Zyvox Flagyl and Levaquin and yesterday changed to doxycycline Flagyl and Levaquin. His inflammatory markers on 6/26 showed a sedimentation rate of 129 and a C-reactive protein of 5. This is improved to 14 and 1.3 respectively. This would indicate  improvement. ABIs in our clinic today were 1.23 on the right and 1.20 on the left 11/01/2019 on evaluation today patient appears to be doing fairly well in regard to the wounds on his feet at this point. Fortunately there is no signs of active infection at this time. No fevers, chills, nausea, vomiting, or diarrhea. He currently is seeing infectious disease and still under their care at this point. Subsequently he also has both wounds which she has not been using collagen on as he did not receive that in his packaging he did not call us and let us know that. Apparently that just was missed on the order. Nonetheless we will get that straightened out today. 8/9-Patient returns for bilateral foot wounds, using Prisma with hydrogel moistened dressings, and the wounds appear stable. Patient using surgical shoes, avoiding much pressure or weightbearing as much as possible 8/16; patient has bilateral foot wounds. 1 on the right lateral foot proximally the other is on the left mid lateral foot. Both required debridement of callus and thick skin around the wounds. We have been using silver collagen 8/27; patient has bilateral lateral foot wounds. The area on the left substantially surrounded by callus and dry skin. This was removed from the wound edge. The underlying wound is small. The area on the right measured somewhat smaller today. 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 Unfortunately I did not update his antibiotics during today's visit. 9/10 I reviewed Dr. Hale Bogus last notes he felt he had completed antibiotics his inflammatory markers were reasonably well controlled. He has a small wound on the lateral left foot and a tiny area on the right which is just above closed. He is using Hydrofera Blue with border foam he has bilateral surgical shoes 9/24; 2 week f/u. doing well. right foot is closed. left foot still undermined. 10/14;  right foot remains closed at the fifth met head. The area over the base of the left fifth metatarsal has a small open area but considerable undermining towards the plantar foot. Thick callus skin around this suggests an adequate pressure relief. We have talked about this. He says he is going to go back into his cam boot. I suggested a total contact cast he did not seem enamored with this suggestion 10/26; left foot base of the fifth metatarsal. Same condition as last time. He has skin over the area with an open wound however the skin is not adherent. He went to see Dr. Earleen Newport who did an x-ray and culture of his foot I have not reviewed the x-ray but the patient was not told anything. He is on doxycycline 11/11; since the patient was last here he was in  the emergency room on 10/30 he was concerned about swelling in the left foot. They did not do any cultures or x-rays. They changed his antibiotics to cephalexin. Previous culture showed group B strep. The cephalexin is appropriate as doxycycline has less than predictable coverage. Arrives in clinic today with swelling over this area under the wound. He also has a new wound on the right fifth metatarsal head 11/18; the patient has a difficult wound on the lateral aspect of the left fifth metatarsal head. The wound was almost ballotable last week I opened it slightly expecting to see purulence however there was just bleeding. I cultured this this was negative. X-ray unchanged. We are trying to get an MRI but I am not sure were going to be able to get this through his insurance. He also has an area on the right lateral fifth metatarsal head this looks healthier 12/3; the patient finally got our MRI. Surprisingly this did not show osteomyelitis. I did show the soft tissue ulceration at the lateral plantar aspect of the fifth metatarsal base with a tiny residual 6 mm abscess overlying the superficial fascia I have tried to culture this area I have not been  able to get this to grow anything. Nevertheless the protruding tissue looks aggravated. I suspect we should try to treat the underlying "abscess with broad-spectrum antibiotics. I am going to start him on Levaquin and Flagyl. He has much less edema in his legs and I am going to continue to wrap his legs and see him weekly 12/10. I started Levaquin and Flagyl on him last week. He just picked up the Flagyl apparently there was some delay. The worry is the wound on the left fifth metatarsal base which is substantial and worsening. His foot looks like he inverts at the ankle making this a weightbearing surface. Certainly no improvement in fact I think the measurements of this are somewhat worse. We have been using 12/17; he apparently just got the Levaquin yesterday this is 2 weeks after the fact. He has completed the Flagyl. The area over the left fifth metatarsal base still has protruding granulation tissue although it does not look quite as bad as it did some weeks ago. He has severe bilateral lymphedema although we have not been treating him for wounds on his legs this is definitely going to require compression. There was so much edema in the left I did not wish to put him in a total contact cast today. I am going to increase his compression from 3-4 layer. The area on the right lateral fifth met head actually look quite good and superficial. 12/23; patient arrived with callus on the right fifth met head and the substantial hyper granulated callused wound on the base of his fifth metatarsal. He says he is completing his Levaquin in 2 days but I do not think that adds up with what I gave him but I will have to double check this. We are using Hydrofera Blue on both areas. My plan is to put the left leg in a cast the week after New Year's 04/06/2020; patient's wounds about the same. Right lateral fifth metatarsal head and left lateral foot over the base of the fifth metatarsal. There is undermining on the  left lateral foot which I removed before application of total contact cast continuing with Hydrofera Blue new. Patient tells me he was seen by endocrinology today lab work was done [Dr. Kerr]. Also wondering whether he was referred to cardiology. I went over  some lab work from previously does not have chronic renal failure certainly not nephrotic range proteinuria he does have very poorly controlled diabetes but this is not his most updated lab work. Hemoglobin A1c has been over 11 1/10; the patient had a considerable amount of leakage towards mid part of his left foot with macerated skin however the wound surface looks better the area on the right lateral fifth met head is better as well. I am going to change the dressing on the left foot under the total contact cast to silver alginate, continue with Hydrofera Blue on the right. 1/20; patient was in the total contact cast for 10 days. Considerable amount of drainage although the skin around the wound does not look too bad on the left foot. The area on the right fifth metatarsal head is closed. Our nursing staff reports large amount of drainage out of the left lateral foot wound 1/25; continues with copious amounts of drainage described by our intake staff. PCR culture I did last week showed E. coli and Enterococcus faecalis and low quantities. Multiple resistance genes documented including extended spectrum beta lactamase, MRSA, MRSE, quinolone, tetracycline. The wound is not quite as good this week as it was 5 days ago but about the same size 2/3; continues with copious amounts of malodorous drainage per our intake nurse. The PCR culture I did 2 weeks ago showed E. coli and low quantities of Enterococcus. There were multiple resistance genes detected. I put Neosporin on him last week although this does not seem to have helped. The wound is slightly deeper today. Offloading continues to be an issue here although with the amount of drainage she has a  total contact cast is just not going to work 2/10; moderate amount of drainage. Patient reports he cannot get his stocking on over the dressing. I told him we have to do that the nurse gave him suggestions on how to make this work. The wound is on the bottom and lateral part of his left foot. Is cultured predominantly grew low amounts of Enterococcus, E. coli and anaerobes. There were multiple resistance genes detected including extended spectrum beta lactamase, quinolone, tetracycline. I could not think of an easy oral combination to address this so for now I am going to do topical antibiotics provided by [facility] I think the main agents here are vancomycin and an aminoglycoside. We have to be able to give him access to the wounds to get the topical antibiotic on 2/17; moderate amount of drainage this is unchanged. He has his Keystone topical antibiotic against the deep tissue culture organisms. He has been using this and changing the dressing daily. Silver alginate on the wound surface. 2/24; using Keystone antibiotic with silver alginate on the top. He had too much drainage for a total contact cast at one point although I think that is improving and I think in the next week or 2 it might be possible to replace a total contact cast I did not do this today. In general the wound surface looks healthy however he continues to have thick rims of skin and subcutaneous tissue around the wide area of the circumference which I debrided 06/04/2020 upon evaluation today patient appears to be doing well in regard to his wound. I do feel like he is showing signs of improvement. There is little bit of callus and dead tissue around the edges of the wound as well as what appears to be a little bit of a sinus tract that is  off to the side laterally I would perform debridement to clear that away today. 3/17; left lateral foot. The wound looks about the same as I remember. Not much depth surface looks healthy. No  evidence of infection 3/25; left lateral foot. Wound surface looks about the same. Separating epithelium from the circumference. There really is no evidence of infection here however not making progress by my view 3/29; left lateral foot. Surface of the wound again looks reasonably healthy still thick skin and subcutaneous tissue around the wound margins. There is no evidence of infection. One of the concerns being brought up by the nurses has again the amount of drainage vis--vis continued use of a total contact cast 4/5; left lateral foot at roughly the base of the fifth metatarsal. Nice healthy looking granulated tissue with rims of epithelialization. The overall wound measurements are not any better but the tissue looks healthy. The only concern is the amount of drainage although he has no surrounding maceration with what we have been doing recently to absorb fluid and protect his skin. He also has lymphedema. 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 He states that the kerlix/coban under the TCC rolled down and could not get it back up. He has been using an offloading scooter and has somehow hurt his right foot using this device. This happened last week. He states that the side of his right foot developed a blister and opened. The top of his foot also has a few small open wounds he thinks is due to his socks rubbing in his shoes. He has not been using any dressings to the wound. He denies purulent drainage, fever/chills or erythema to the wounds. 4/22; patient presents for 1 week follow-up. He developed new wounds to the right foot that were evaluated at last clinic visit. He continues to have a total contact cast to the left leg and he reports no  issues. He has been using silver collagen to the right foot wounds with no issues. He denies purulent drainage, fever/chills or erythema to the right foot wounds. He has no complaints today 4/25; patient presents for 1 week follow-up. He has a total contact cast of the left leg and reports no issues. He has been using silver alginate to the right foot wound. He denies purulent drainage, fever/chills or erythema to the right foot wounds. 5/2 patient presents for 1 week follow-up. T contact cast on the left. The wound which is on the base of the plantar foot at the base of the fifth metatarsal otal actually looks quite good and dimensions continue to gradually contract. HOWEVER the area on the right lateral fifth metatarsal head is much larger than what I remember from 2 weeks ago. Once more is he has significant levels of hypergranulation. Noteworthy that he had this same hyper granulated response on his wound on the left foot at one point in time. So much so that he I thought there was an underlying fluid collection. Based on this I think this just needs debridement. 5/9; the wound on the left actually continues to be gradually smaller with a healthy surface. Slight amount of drainage and maceration of the skin around but not too bad. However he has a large wound  over the right fifth metatarsal head very much in the same configuration as his left foot wound was initially. I used silver nitrate to address the hyper granulated tissue no mechanical debridement 5/16; area on the left foot did not look as healthy this week deeper thick surrounding macerated skin and subcutaneous tissue. The area on the right foot fifth met head was about the same The area on the right ankle that we identified last week is completely broken down into an open wound presumably a stocking rubbing issue 5/23; patient has been using a total contact cast to the left side. He has been using silver alginate underneath. He has  also been using silver alginate to the right foot wounds. He has no complaints today. He denies any signs of infection. 5/31; the left-sided wound looks some better measure smaller surface granulation looks better. We have been using silver alginate under the total contact cast The large area on his right fifth met head and right dorsal foot look about the same still using silver alginate 6/6; neither side is good as I was hoping although the surface area dimensions are better. A lot of maceration on his left and right foot around the wound edge. Area on the dorsal right foot looks better. He says he was traveling. I am not sure what does the amount of maceration around the plantar wounds may be drainage issues 6/13; in general the wound surfaces look quite good on both sides. Macerated skin and raised edges around the wound required debridement although in general especially on the left the surface area seems improved. The area on the right dorsal ankle is about the same I thought this would not be such a problem to close 6/20; not much change in either wound although the one on the right looks a little better. Both wounds have thick macerated edges to the skin requiring debridements. We have been using silver alginate. The area on the dorsal right ankle is still open I thought this would be closed. 6/28; patient comes in today with a marked deterioration in the right foot wound fifth met head. Wide area of exposed bone this is a drastic change from last time. The area on the left there we have been casting is stagnant. We have been using silver alginate in both wound areas. 7/5; bone culture I did for PCR last time was positive for Pseudomonas, group B strep, Enterococcus and Staph aureus. There was no suggestion of methicillin resistance or ampicillin resistant genes. This was resistant to tetracycline however He comes into the clinic today with the area over his right plantar fifth metatarsal  head which had been doing so well 2 weeks ago completely necrotic feeling bone. I do not know that this is going to be salvageable. The left foot wound is certainly no smaller but it has a better surface and is superficial. 7/8; patient called in this morning to say that his total contact cast was rubbing against his foot. He states he is doing fine overall. He denies signs of infection. 7/12; continued deterioration in the wound over the right fifth metatarsal head crumbling bone. This is not going to be salvageable. The patient agrees and wants to be referred to Dr. Doran Durand which we will attempt to arrange as soon as possible. I am going to continue him on antibiotics as long as that takes so I will renew those today. The area on the left foot which is the base of the fifth metatarsal continues to  look somewhat better. Healthy looking tissue no depth no debridement is necessary here. 7/20; the patient was kindly seen by Dr. Doran Durand of orthopedics on 10/19/2020. 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 Towards this end we have taken him out of the total contact cast on the left we will put him in a wrap with Hydrofera Blue. As I understand things surgery is planned for 7/21 7/27; patient had his surgery last Thursday. 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 Apparently everything went well we did not still disturb that today The area on the left foot actually looks quite good. He has been much less mobile which probably explains this he did not seem to do well in the total contact cast secondary to drainage and maceration I think. 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 Fortunately there is no evidence of active infection at this time. No fevers, chills, nausea, vomiting, or diarrhea. Overall I  think that he is actually doing extremely well. Nonetheless I do believe that he is staying off of this more following the surgery in his right foot that is the reason the left is doing so great. 8/16; left plantar foot wound. This looks smaller than the last time I saw this he is using Hydrofera Blue. The surgical wound on the right foot is being followed by Dr. Doran Durand we did not look at this today. He has surgical shoes on both feet 8/23; left plantar foot wound not as good this week. Surrounding macerated skin and subcutaneous tissue everything looks moist and wet. I do not think he is offloading this adequately. He is using a surgical shoe Apparently the right foot surgical wound is not open although I did not check his foot 8/31; left plantar foot lateral aspect. Much improved this week. He has no maceration. Some improvement in the surface area of the wound but most impressively the depth is come in we are using silver alginate. The patient is a Product/process development scientist. He is asked that we write him a letter so he can go back to work. I have also tried to see if we can write something that will allow him to limit the amount of time that he is on his foot at work. 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 Hopefully the latter can be avoided 9/6; I believe the patient missed an appointment last week. He arrives in today with a wound looking roughly the same certainly no better. Undermining laterally and also inferiorly. We used molecuLight today in training with the patient's permission.. We are using silver alginate 9/21 wound is measuring bigger this week although this may have to do with the aggressive circumferential debridement last week in response to the blush fluorescence on the MolecuLight. Culture I did last week showed significant MSSA and E. coli. I put him on Augmentin but he has not started it yet. We  are also going to send this for compounded antibiotics at [facility]. There is no evidence of systemic infection 9/29; silver alginate. His Keystone arrived. He is completing Augmentin in 2 days. Offloading in a cam boot. Moderate drainage per our intake staff 10/5; using silver alginate. He has  been using his [place]. He has completed his Augmentin. Per our intake nurse still a lot of drainage, far too much to consider a total contact cast. Wound measures about the same. He had the same undermining area that I defined last week from a roughly 11-3. I remove this today 10/12; using silver alginate he is using the [place]. He comes in for a nurse visit hence we are applying [employer] twice a week. Measuring slightly better today and less notable drainage. Extensive debridement of the wound edge last time 10/18; using topical Keystone and silver alginate and a soft cast. Wound measurements about the same. Drainage was through his soft cast. We are changing this twice a week Tuesdays and Friday 10/25; comes in with moderate drainage. Still using Keystone silver alginate and a soft cast. Wound dimensions completely the same.He has a lot of edema in the left leg he has lymphedema. Asking for [name] to consider wrapping him as he cannot get his stocking on over the soft cast 11/2; comes in with moderate to large drainage slightly smaller in terms of width we have been using [place]. His wound looks satisfactory but not much improvement 11/4; patient presents today for obligatory cast change. Has no issues or complaints today. He denies signs of infection. 11/9; patient traveled this weekend to [place], was on the cast quite a bit. Staining of the cast with black material from his walking boot. Drainage was not quite as bad as we feared. Using silver alginate and Keystone 11/16; we do not have size for cast therefore we have been putting a soft cast on him since the change on Friday. Still a significant amount of  drainage necessitating changing twice a week. We have been using the Keystone at cast changes either hard or soft as well as silver alginate Comes in the clinic with things actually looking fairly good improvement in width. He says his offloading is about the same 02/24/2021 upon evaluation today patient actually comes back in and is doing excellent in regard to his foot ulcer this is significantly smaller even compared to the last visit. The soft cast seems to have done extremely well for him which is great news. I do not see any signs of infection minimal debridement will be needed today. Electronic Signature(s) Signed: 02/24/2021 11:54:01 AM By: Worthy Keeler PA-C Previous Signature: 02/24/2021 11:50:14 AM Version By: Worthy Keeler PA-C Entered By: Worthy Keeler on 02/24/2021 11:54:01 -------------------------------------------------------------------------------- Physical Exam Details Patient Name: Date of Service: [name], [name] 02/24/2021 9:30 A M Medical Record Number: [id number] Patient Account Number: [id number] Date of Birth/Sex: Treating RN: September 14, 1985 (35 y.o. M) Primary Care Provider: [name] Other Clinician: Referring Provider: Treating Provider/Extender: Jennye Moccasin in Treatment: 33 Constitutional Well-nourished and well-hydrated in no acute distress. Respiratory normal breathing without difficulty. Psychiatric this patient is able to make decisions and demonstrates good insight into disease process. Alert and Oriented x 3. pleasant and cooperative. Notes Patient's wound bed did require sharp debridement to clear away some of the necrotic debris he tolerated that today without complication and postdebridement this appears to be doing significantly better which is great news. I am actually extremely pleased with where we stand overall at this time. Electronic Signature(s) Signed: 02/24/2021 11:54:25 AM By: Worthy Keeler  PA-C Entered By: Worthy Keeler on 02/24/2021 11:54:24 -------------------------------------------------------------------------------- Physician Orders Details Patient Name: Date of Service: [name], [name] 02/24/2021 9:30 A M Medical Record Number:  [id number] Patient Account Number: [id number] Date of Birth/Sex: Treating RN: 09/20/1985 (35 y.o. [name], [name] Primary Care Provider: [name] Other Clinician: Referring Provider: Treating Provider/Extender: Jennye Moccasin in Treatment: 54 Verbal / Phone Orders: No Diagnosis Coding ICD-10 Coding Code Description E11.621 Type 2 diabetes mellitus with foot ulcer L97.528 Non-pressure chronic ulcer of other part of left foot with other specified severity Follow-up Appointments Return Appointment in 1 week. Nurse Visit: - Monday to change soft cast Bathing/ Shower/ Hygiene May shower with protection but do not get wound dressing(s) wet. Edema Control - Lymphedema / SCD / Other Bilateral Lower Extremities Elevate legs to the level of the heart or above for 30 minutes daily and/or when sitting, a frequency of: - throughout the day Avoid standing for long periods of time. Exercise regularly Moisturize legs daily. - right leg every night before bed. Compression stocking or Garment 20-30 mm/Hg pressure to: - Apply to right leg in the morning and remove at night. Off-Loading Other: - minimal weight bearing left foot Other: - Soft cast to left foot Additional Orders / Instructions Follow Nutritious Diet Wound Treatment Wound #3 - Foot Wound Laterality: Left, Lateral Cleanser: Soap and Water 2 x Per Week/30 Days Discharge Instructions: May shower and wash wound with dial antibacterial soap and water prior to dressing change. Cleanser: Wound Cleanser (Generic) 2 x Per Week/30 Days Discharge Instructions: Cleanse the wound with wound cleanser prior to applying a clean dressing using gauze sponges, not  tissue or cotton balls. Peri-Wound Care: Zinc Oxide Ointment 30g tube 2 x Per Week/30 Days Discharge Instructions: Apply Zinc Oxide to periwound with each dressing change as needed. Topical: keystone antibiotic compound 2 x Per Week/30 Days Discharge Instructions: thin layer to wound bed Prim Dressing: KerraCel Ag Gelling Fiber Dressing, 4x5 in (silver alginate) (Generic) 2 x Per Week/30 Days ary Discharge Instructions: Apply silver alginate to wound bed as instructed Secondary Dressing: ABD Pad, 8x10 2 x Per Week/30 Days Discharge Instructions: Apply over primary dressing as directed. Secondary Dressing: Zetuvit Plus 4x8 in (Generic) 2 x Per Week/30 Days Discharge Instructions: Apply over primary dressing as directed. Compression Wrap: Kerlix Roll 4.5x3.1 (in/yd) 2 x Per Week/30 Days Discharge Instructions: Apply Kerlix and Coban compression as directed. Compression Wrap: Coban Self-Adherent Wrap 4x5 (in/yd) 2 x Per Week/30 Days Discharge Instructions: Apply over Kerlix as directed. Electronic Signature(s) Signed: 02/24/2021 3:00:15 PM By: Rhae Hammock RN Signed: 02/24/2021 5:33:12 PM By: Worthy Keeler PA-C Entered By: Rhae Hammock on 02/24/2021 10:48:15 -------------------------------------------------------------------------------- Problem List Details Patient Name: Date of Service: [name], [name] 02/24/2021 9:30 A M Medical Record Number: [id number] Patient Account Number: [id number] Date of Birth/Sex: Treating RN: 29-Apr-1985 (35 y.o. M) Primary Care Provider: [name] Other Clinician: Referring Provider: Treating Provider/Extender: Jennye Moccasin in Treatment: 49 Active Problems ICD-10 Encounter Code Description Active Date MDM Diagnosis E11.621 Type 2 diabetes mellitus with foot ulcer 10/24/2019 No Yes L97.528 Non-pressure chronic ulcer of other part of left foot with other specified 10/24/2019 No Yes severity Inactive  Problems ICD-10 Code Description Active Date Inactive Date L97.518 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ity 10/24/2019 10/24/2019 L97.518 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ity 07/14/2020 07/14/2020 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, right ankle and foot 10/24/2019 10/24/2019 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 08/10/2020 08/10/2020 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, left ankle and  foot 10/24/2019 10/24/2019 B95.62 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 10/24/2019 10/24/2019 classified elsewhere Resolved Problems Electronic Signature(s) Signed: 02/24/2021 10:18:41 AM By: Worthy Keeler PA-C Entered By: Worthy Keeler on 02/24/2021 10:18:41 -------------------------------------------------------------------------------- Progress Note Details Patient Name: Date of Service: [name], [name] 02/24/2021 9:30 A M Medical Record Number: [id number] Patient Account Number: [id number] Date of Birth/Sex: Treating RN: [birth year] (35 y.o. 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 He comes in for review of a wound over the plantar fifth metatarsal head extending into the lateral part of the foot. He was followed for this previously by his  podiatrist Dr. Cornelius Moras. As the patient tells his story he went to see podiatry first for a swelling he developed on the lateral part of his fifth metatarsal head in May. He states this was "open" by podiatry and the area closed. He was followed up in June and it was again opened callus removed and it closed promptly. There were plans being made for surgery on the fifth metatarsal head in June however his blood sugar was apparently too high for anesthesia. Apparently the area was debrided and opened again in June and it is never closed since. Looking over the records from podiatry I am really not able to follow this. It was clear when he was first seen it was before 5/14 at that point he already had a wound. By 5/17 the ulcer was resolved. I do not see anything about a procedure. On 5/28 noted to have pre-ulcerative moderate keratosis. X-ray noted 1/5 contracted toe and tailor's bunion and metatarsal deformity. On a visit date on 09/28/2018 the dorsal part of the left foot it healed and resolved. There was concern about swelling in his lower extremity he was sent to the ER.. As far as I can tell he was seen in the ER on 7/12 with an ulcer on his left foot. A DVT rule out of the left leg was negative. I do not think I have complete records from podiatry but I am not able to verify the procedures this patient states he had. He states after the last procedure the wound has never closed although I am not able to follow this in the records I have from podiatry. He has not had a recent x-ray The patient has been using Neosporin on the wound. He is wearing a Darco shoe. He is still very active up on his foot working and exercising. Past medical history; type 2 diabetes ketosis-prone, leg swelling with a negative DVT study in July. Non-smoker ABI in our clinic was 0.85 on the left 10/16; substantial wound on the plantar left fifth met head extending laterally almost to the dorsal fifth MTP. We have been using  silver alginate we gave him a Darco forefoot off loader. An x-ray did not show evidence of osteomyelitis did note soft tissue emphysema which I think was due to gas tracking through an open wound. There is no doubt in my mind he  requires an MRI 10/23; MRI not booked until 3 November at the earliest this is largely due to his glucose sensor in the right arm. We have been using silver alginate. There has been an improvement 10/29; I am still not exactly sure when his MRI is booked for. He says it is the third but it is the 10th in epic. This definitely needs to be done. He is running a low-grade fever today but no other symptoms. No real improvement in the 1 02/26/2019 patient presents today for a follow-up visit here in our clinic he is last been seen in the clinic on October 29. Subsequently we were working on getting MRI to evaluate and see what exactly was going on and where we would need to go from the standpoint of whether or not he had osteomyelitis and again what treatments were going be required. Subsequently the patient ended up being admitted to the hospital on 02/07/2019 and was discharged on 02/14/2019. This is a somewhat interesting admission with a discharge diagnosis of pneumonia due to COVID-19 although he was positive for COVID-19 when tested at the urgent care but negative x2 when he was actually in the hospital. With that being said he did have acute respiratory failure with hypoxia and it was noted he also have a left foot ulceration with osteomyelitis. With that being said he did require oxygen for his pneumonia and I level 4 L. He was placed on antivirals and steroids for the COVID-19. He was also transferred to the [place] at one point. Nonetheless he did subsequently discharged home and since being home has done much better in that regard. The CT angiogram did not show any pulmonary embolism. With regard to the osteomyelitis the patient was placed on vancomycin and  Zosyn while in the hospital but has been changed to Augmentin at discharge. It was also recommended that he follow- up with wound care and podiatry. Podiatry however wanted him to see [name] according to the patient prior to them doing anything further. His hemoglobin A1c was 9.9 as noted in the hospital. Have an MRI of the left foot performed while in the hospital on 02/04/2019. 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 There is an overlying plantar open wound noted an abscess tracking back along the lateral aspect of the fifth metatarsal shaft. There is otherwise diffuse cellulitis and mild fasciitis without findings of polymyositis. The patient did have recently pneumonia secondary to COVID-19 I looked in the chart through epic and it does appear that the patient may need to have an additional x-ray just to ensure everything is cleared and that he has no airspace disease prior to putting him into the [name]. 03/05/2019; patient was readmitted to the clinic last week. He was hospitalized twice for a viral upper respiratory tract infection from 11/1 through 11/4 and then 11/5 through 11/12 ultimately this turned out to be Covid pneumonitis. Although he was discharged on oxygen he is not using it. He says he feels fine. He has no exercise limitation no cough no sputum. His O2 sat in our clinic today was 100% on room air. 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 He received Vanco and Zosyn in the hospital and then was discharged on 2 weeks of Augmentin. I do not see any relevant cultures.  He was supposed to follow-up with infectious disease but I do not see that he has an appointment. 12/8; patient saw Dr. Novella Olive of infectious disease last week. He felt that he had had adequate antibiotic therapy.  He did not go to follow-up with Dr. Amalia Hailey of podiatry and I have again talked to him about the pros and cons of this. He does not want to consider a ray amputation of this time. He is aware of the risks of recurrence, migration etc. He started HBO today and tolerated this well. He can complete the Augmentin that I gave him last week. I have looked over the lab work that Dr. Chana Bode ordered his C-reactive protein was 3.3 and his sedimentation rate was 17. The C-reactive protein is never really been measurably that high in this patient 12/15; not much change in the wound today however he has undermining along the lateral part of the foot again more extensively than last week. He has some rims of epithelialization. We have been using silver alginate. He is undergoing hyperbarics but did not dive today 12/18; in for his obligatory first total contact cast change. Unfortunately there was pus coming from the undermining area around his fifth metatarsal head. This was cultured but will preclude reapplication of a cast. He is seen in conjunction with HBO 12/24; patient had staph lugdunensis in the wound in the undermining area laterally last time. We put him on doxycycline which should have covered this. The wound looks better today. I am going to give him another week of doxycycline before reattempting the total contact cast 12/31; the patient is completing antibiotics. Hemorrhagic debris in the distal part of the wound with some undermining distally. He also had hyper granulation. Extensive debridement with a #5 curette. The infected area that was on the lateral part of the fifth met head is closed over. I do not think he needs any more antibiotics. Patient was seen prior to HBO. Preparations for a total contact cast were made in the cast will be placed post hyperbarics 04/11/19; once again the patient arrives today without complaint. He had been in a cast all week noted that he had heavy drainage this week.  This resulted in large raised areas of macerated tissue around the wound 1/14; wound bed looks better slightly smaller. Hydrofera Blue has been changing himself. He had a heavy drainage last week which caused a lot of maceration around the wound so I took him out of a total contact cast he says the drainage is actually better this week He is seen today in conjunction with HBO 1/21; returns to clinic. He was up in [place] for a day or 2 attending a funeral. He comes back in with the wound larger and with a large area of exposed bone. He had osteomyelitis and septic arthritis of the fifth left metatarsal head while he was in hospital. He received IV antibiotics in the hospital for a prolonged period of time then 3 weeks of Augmentin. Subsequently I gave him 2 weeks of doxycycline for more superficial wound infection. When I saw this last week the wound was smaller the surface of the wound looks satisfactory. 1/28; patient missed hyperbarics today. Bone biopsy I did last time showed Enterococcus faecalis and Staphylococcus lugdunensis . He has a wide area of exposed bone. We are going to use silver alginate as of today. I had another ethical discussion with the patient. 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 In this situation  I think the likelihood of healing this is low. Therefore I have recommended a ray amputation and with the patient's agreement I have referred him to Dr. Doran Durand. The other issue is that his compliance with hyperbarics has been minimal because of his work schedule and given his underlying decision I am going to stop this today READMISSION 10/24/2019 MRI 09/29/2019 left foot IMPRESSION: 1. Apparent skin ulceration inferior and lateral to the 5th metatarsal base with underlying heterogeneous T2 signal and enhancement in the subcutaneous fat. Small peripherally enhancing fluid collections along the plantar and lateral aspects of the  5th metatarsal base suspicious for abscesses. 2. Interval amputation through the mid 5th metatarsal with nonspecific low-level marrow edema and enhancement. Given the proximity to the adjacent soft tissue inflammatory changes, osteomyelitis cannot be excluded. 3. The additional bones appear unremarkable. MRI 09/29/2019 right foot IMPRESSION: 1. Soft tissue ulceration lateral to the 5th MTP joint. There is low-level T2 hyperintensity within the 4th and 5th metatarsal heads and adjacent proximal phalanges without abnormal T1 signal or cortical destruction. These findings are nonspecific and could be seen with early marrow edema, hyperemia or early osteomyelitis. No evidence of septic joint. 2. Mild tenosynovitis and synovial enhancement associated with the extensor digitorum tendons at the level of the midfoot. 3. Diffuse low-level muscular T2 hyperintensity and enhancement, most consistent with diabetic myopathy. LEFT FOOT BONE Methicillin resistant staphylococcus aureus Staphylococcus lugdunensis MIC MIC CIPROFLOXACIN >=8 RESISTANT Resistant <=0.5 SENSI... Sensitive CLINDAMYCIN <=0.25 SENS... Sensitive >=8 RESISTANT Resistant ERYTHROMYCIN >=8 RESISTANT Resistant >=8 RESISTANT Resistant GENTAMICIN <=0.5 SENSI... Sensitive <=0.5 SENSI... Sensitive Inducible Clindamycin NEGATIVE Sensitive NEGATIVE Sensitive OXACILLIN >=4 RESISTANT Resistant 2 SENSITIVE Sensitive RIFAMPIN <=0.5 SENSI... Sensitive <=0.5 SENSI... Sensitive TETRACYCLINE <=1 SENSITIVE Sensitive <=1 SENSITIVE Sensitive TRIMETH/SULFA <=10 SENSIT Sensitive <=10 SENSIT Sensitive ... Marland Kitchen.. VANCOMYCIN 1 SENSITIVE Sensitive <=0.5 SENSI... Sensitive Right foot bone . Component 3 wk ago Specimen Description BONE Special Requests RIGHT 4 METATARSAL SAMPLE B Gram Stain NO WBC SEEN NO ORGANISMS SEEN Culture RARE METHICILLIN RESISTANT STAPHYLOCOCCUS AUREUS NO ANAEROBES ISOLATED Performed at [facility], [place] [address]  [place], [place] [address] Report Status 10/08/2019 FINAL Organism ID, Bacteria METHICILLIN RESISTANT STAPHYLOCOCCUS AUREUS Resulting Agency [facility] Susceptibility Methicillin resistant staphylococcus aureus MIC CIPROFLOXACIN >=8 RESISTANT Resistant CLINDAMYCIN <=0.25 SENS... Sensitive ERYTHROMYCIN >=8 RESISTANT Resistant GENTAMICIN <=0.5 SENSI... Sensitive Inducible Clindamycin NEGATIVE Sensitive OXACILLIN >=4 RESISTANT Resistant RIFAMPIN <=0.5 SENSI... Sensitive TETRACYCLINE <=1 SENSITIVE Sensitive TRIMETH/SULFA <=10 SENSIT Sensitive ... VANCOMYCIN 1 SENSITIVE Sensitive This is a patient we had in clinic earlier this year with a wound over his left fifth metatarsal head. 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 Doran Durand on 05/16/2019. He seems to have developed problems with wounds on his bilateral feet in June 2021 although he may have had problems earlier than this. He was in an urgent care with a right foot ulcer on 09/26/2019 and given a course of doxycycline. This was apparently after having trouble getting into see orthopedics. He was seen by podiatry on 09/28/2019 noted to have bilateral lower extremity ulcers including the left lateral fifth metatarsal base and the right subfifth met head. It was noted that had purulent drainage at that time. He required hospitalization from 6/20 through 7/2. This was because  of worsening right foot wounds. He underwent bilateral operative incision and drainage and bone biopsies bilaterally. Culture results are listed above. He has been referred back to clinic by Dr. Jacqualyn Posey of podiatry. He is also followed by Dr. Megan Salon who saw him yesterday. He was discharged from  hospital on Zyvox Flagyl and Levaquin and yesterday changed to doxycycline Flagyl and Levaquin. His inflammatory markers on 6/26 showed a sedimentation rate of 129 and a C-reactive protein of 5. This is improved to 14 and 1.3 respectively. This would indicate improvement. ABIs in our clinic today were 1.23 on the right and 1.20 on the left 11/01/2019 on evaluation today patient appears to be doing fairly well in regard to the wounds on his feet at this point. Fortunately there is no signs of active infection at this time. No fevers, chills, nausea, vomiting, or diarrhea. He currently is seeing infectious disease and still under their care at this point. Subsequently he also has both wounds which she has not been using collagen on as he did not receive that in his packaging he did not call us and let us know that. Apparently that just was missed on the order. Nonetheless we will get that straightened out today. 8/9-Patient returns for bilateral foot wounds, using Prisma with hydrogel moistened dressings, and the wounds appear stable. Patient using surgical shoes, avoiding much pressure or weightbearing as much as possible 8/16; patient has bilateral foot wounds. 1 on the right lateral foot proximally the other is on the left mid lateral foot. Both required debridement of callus and thick skin around the wounds. We have been using silver collagen 8/27; patient has bilateral lateral foot wounds. The area on the left substantially surrounded by callus and dry skin. This was removed from the wound edge. The underlying wound is small. The area on the right measured somewhat smaller today. 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 Unfortunately I did not update his antibiotics during today's visit. 9/10 I reviewed Dr. Hale Bogus last notes he felt he had completed antibiotics his inflammatory markers were reasonably well controlled. He has a  small wound on the lateral left foot and a tiny area on the right which is just above closed. He is using Hydrofera Blue with border foam he has bilateral surgical shoes 9/24; 2 week f/u. doing well. right foot is closed. left foot still undermined. 10/14; right foot remains closed at the fifth met head. The area over the base of the left fifth metatarsal has a small open area but considerable undermining towards the plantar foot. Thick callus skin around this suggests an adequate pressure relief. We have talked about this. He says he is going to go back into his cam boot. I suggested a total contact cast he did not seem enamored with this suggestion 10/26; left foot base of the fifth metatarsal. Same condition as last time. He has skin over the area with an open wound however the skin is not adherent. He went to see Dr. Earleen Newport who did an x-ray and culture of his foot I have not reviewed the x-ray but the patient was not told anything. He is on doxycycline 11/11; since the patient was last here he was in the emergency room on 10/30 he was concerned about swelling in the left foot. They did not do any cultures or x-rays. They changed his antibiotics to cephalexin. Previous culture showed group B strep. The cephalexin is appropriate as doxycycline  has less than predictable coverage. Arrives in clinic today with swelling over this area under the wound. He also has a new wound on the right fifth metatarsal head 11/18; the patient has a difficult wound on the lateral aspect of the left fifth metatarsal head. The wound was almost ballotable last week I opened it slightly expecting to see purulence however there was just bleeding. I cultured this this was negative. X-ray unchanged. We are trying to get an MRI but I am not sure were going to be able to get this through his insurance. He also has an area on the right lateral fifth metatarsal head this looks healthier 12/3; the patient finally got our MRI.  Surprisingly this did not show osteomyelitis. I did show the soft tissue ulceration at the lateral plantar aspect of the fifth metatarsal base with a tiny residual 6 mm abscess overlying the superficial fascia I have tried to culture this area I have not been able to get this to grow anything. Nevertheless the protruding tissue looks aggravated. I suspect we should try to treat the underlying "abscess with broad-spectrum antibiotics. I am going to start him on Levaquin and Flagyl. He has much less edema in his legs and I am going to continue to wrap his legs and see him weekly 12/10. I started Levaquin and Flagyl on him last week. He just picked up the Flagyl apparently there was some delay. The worry is the wound on the left fifth metatarsal base which is substantial and worsening. His foot looks like he inverts at the ankle making this a weightbearing surface. Certainly no improvement in fact I think the measurements of this are somewhat worse. We have been using 12/17; he apparently just got the Levaquin yesterday this is 2 weeks after the fact. He has completed the Flagyl. The area over the left fifth metatarsal base still has protruding granulation tissue although it does not look quite as bad as it did some weeks ago. He has severe bilateral lymphedema although we have not been treating him for wounds on his legs this is definitely going to require compression. There was so much edema in the left I did not wish to put him in a total contact cast today. I am going to increase his compression from 3-4 layer. The area on the right lateral fifth met head actually look quite good and superficial. 12/23; patient arrived with callus on the right fifth met head and the substantial hyper granulated callused wound on the base of his fifth metatarsal. He says he is completing his Levaquin in 2 days but I do not think that adds up with what I gave him but I will have to double check this. We are using  Hydrofera Blue on both areas. My plan is to put the left leg in a cast the week after New Year's 04/06/2020; patient's wounds about the same. Right lateral fifth metatarsal head and left lateral foot over the base of the fifth metatarsal. There is undermining on the left lateral foot which I removed before application of total contact cast continuing with Hydrofera Blue new. Patient tells me he was seen by endocrinology today lab work was done [Dr. Kerr]. Also wondering whether he was referred to cardiology. I went over some lab work from previously does not have chronic renal failure certainly not nephrotic range proteinuria he does have very poorly controlled diabetes but this is not his most updated lab work. Hemoglobin A1c has been over 11 1/10; the  patient had a considerable amount of leakage towards mid part of his left foot with macerated skin however the wound surface looks better the area on the right lateral fifth met head is better as well. I am going to change the dressing on the left foot under the total contact cast to silver alginate, continue with Hydrofera Blue on the right. 1/20; patient was in the total contact cast for 10 days. Considerable amount of drainage although the skin around the wound does not look too bad on the left foot. The area on the right fifth metatarsal head is closed. Our nursing staff reports large amount of drainage out of the left lateral foot wound 1/25; continues with copious amounts of drainage described by our intake staff. PCR culture I did last week showed E. coli and Enterococcus faecalis and low quantities. Multiple resistance genes documented including extended spectrum beta lactamase, MRSA, MRSE, quinolone, tetracycline. The wound is not quite as good this week as it was 5 days ago but about the same size 2/3; continues with copious amounts of malodorous drainage per our intake nurse. The PCR culture I did 2 weeks ago showed E. coli and low quantities  of Enterococcus. There were multiple resistance genes detected. I put Neosporin on him last week although this does not seem to have helped. The wound is slightly deeper today. Offloading continues to be an issue here although with the amount of drainage she has a total contact cast is just not going to work 2/10; moderate amount of drainage. Patient reports he cannot get his stocking on over the dressing. I told him we have to do that the nurse gave him suggestions on how to make this work. The wound is on the bottom and lateral part of his left foot. Is cultured predominantly grew low amounts of Enterococcus, E. coli and anaerobes. There were multiple resistance genes detected including extended spectrum beta lactamase, quinolone, tetracycline. I could not think of an easy oral combination to address this so for now I am going to do topical antibiotics provided by [facility] I think the main agents here are vancomycin and an aminoglycoside. We have to be able to give him access to the wounds to get the topical antibiotic on 2/17; moderate amount of drainage this is unchanged. He has his Keystone topical antibiotic against the deep tissue culture organisms. He has been using this and changing the dressing daily. Silver alginate on the wound surface. 2/24; using Keystone antibiotic with silver alginate on the top. He had too much drainage for a total contact cast at one point although I think that is improving and I think in the next week or 2 it might be possible to replace a total contact cast I did not do this today. In general the wound surface looks healthy however he continues to have thick rims of skin and subcutaneous tissue around the wide area of the circumference which I debrided 06/04/2020 upon evaluation today patient appears to be doing well in regard to his wound. I do feel like he is showing signs of improvement. There is little bit of callus and dead tissue around the edges of the wound as  well as what appears to be a little bit of a sinus tract that is off to the side laterally I would perform debridement to clear that away today. 3/17; left lateral foot. The wound looks about the same as I remember. Not much depth surface looks healthy. No evidence of infection 3/25; left  lateral foot. Wound surface looks about the same. Separating epithelium from the circumference. There really is no evidence of infection here however not making progress by my view 3/29; left lateral foot. Surface of the wound again looks reasonably healthy still thick skin and subcutaneous tissue around the wound margins. There is no evidence of infection. One of the concerns being brought up by the nurses has again the amount of drainage vis--vis continued use of a total contact cast 4/5; left lateral foot at roughly the base of the fifth metatarsal. Nice healthy looking granulated tissue with rims of epithelialization. The overall wound measurements are not any better but the tissue looks healthy. The only concern is the amount of drainage although he has no surrounding maceration with what we have been doing recently to absorb fluid and protect his skin. He also has lymphedema. 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 He states that the kerlix/coban under the TCC rolled down and could not get it back up. He has been using an offloading scooter and has somehow hurt his right foot using this device. This happened last week. He states that the side of his right foot developed a blister and opened. The top of his foot also has a few small open wounds he thinks is due to his socks rubbing in his shoes. He has not been using any dressings to the wound. He denies purulent  drainage, fever/chills or erythema to the wounds. 4/22; patient presents for 1 week follow-up. He developed new wounds to the right foot that were evaluated at last clinic visit. He continues to have a total contact cast to the left leg and he reports no issues. He has been using silver collagen to the right foot wounds with no issues. He denies purulent drainage, fever/chills or erythema to the right foot wounds. He has no complaints today 4/25; patient presents for 1 week follow-up. He has a total contact cast of the left leg and reports no issues. He has been using silver alginate to the right foot wound. He denies purulent drainage, fever/chills or erythema to the right foot wounds. 5/2 patient presents for 1 week follow-up. T contact cast on the left. The wound which is on the base of the plantar foot at the base of the fifth metatarsal otal actually looks quite good and dimensions continue to gradually contract. HOWEVER the area on the right lateral fifth metatarsal head is much larger than what I remember from 2 weeks ago. Once more is he has significant levels of hypergranulation. Noteworthy that he had this same hyper granulated response on his wound on the left foot at one point in time. So much so that he I thought there was an underlying fluid collection. Based on this I think this just needs debridement. 5/9; the wound on the left actually continues to be gradually smaller with a healthy surface. Slight amount of drainage and maceration of the skin around but not too bad. However he has a large wound over the right fifth metatarsal head very much in the same configuration as his left foot wound was initially. I used silver nitrate to address the hyper granulated tissue no mechanical debridement 5/16; area on the left foot did not  look as healthy this week deeper thick surrounding macerated skin and subcutaneous tissue. oo The area on the right foot fifth met head was about the  same oo The area on the right ankle that we identified last week is completely broken down into an open wound presumably a stocking rubbing issue 5/23; patient has been using a total contact cast to the left side. He has been using silver alginate underneath. He has also been using silver alginate to the right foot wounds. He has no complaints today. He denies any signs of infection. 5/31; the left-sided wound looks some better measure smaller surface granulation looks better. We have been using silver alginate under the total contact cast oo The large area on his right fifth met head and right dorsal foot look about the same still using silver alginate 6/6; neither side is good as I was hoping although the surface area dimensions are better. A lot of maceration on his left and right foot around the wound edge. Area on the dorsal right foot looks better. He says he was traveling. I am not sure what does the amount of maceration around the plantar wounds may be drainage issues 6/13; in general the wound surfaces look quite good on both sides. Macerated skin and raised edges around the wound required debridement although in general especially on the left the surface area seems improved. oo The area on the right dorsal ankle is about the same I thought this would not be such a problem to close 6/20; not much change in either wound although the one on the right looks a little better. Both wounds have thick macerated edges to the skin requiring debridements. We have been using silver alginate. The area on the dorsal right ankle is still open I thought this would be closed. 6/28; patient comes in today with a marked deterioration in the right foot wound fifth met head. Wide area of exposed bone this is a drastic change from last time. The area on the left there we have been casting is stagnant. We have been using silver alginate in both wound areas. 7/5; bone culture I did for PCR last time was positive  for Pseudomonas, group B strep, Enterococcus and Staph aureus. There was no suggestion of methicillin resistance or ampicillin resistant genes. This was resistant to tetracycline however He comes into the clinic today with the area over his right plantar fifth metatarsal head which had been doing so well 2 weeks ago completely necrotic feeling bone. I do not know that this is going to be salvageable. The left foot wound is certainly no smaller but it has a better surface and is superficial. 7/8; patient called in this morning to say that his total contact cast was rubbing against his foot. He states he is doing fine overall. He denies signs of infection. 7/12; continued deterioration in the wound over the right fifth metatarsal head crumbling bone. This is not going to be salvageable. The patient agrees and wants to be referred to Dr. Doran Durand which we will attempt to arrange as soon as possible. I am going to continue him on antibiotics as long as that takes so I will renew those today. The area on the left foot which is the base of the fifth metatarsal continues to look somewhat better. Healthy looking tissue no depth no debridement is necessary here. 7/20; the patient was kindly seen by Dr. Doran Durand of orthopedics on 10/19/2020. He agreed that he needed a ray amputation on the  right and he said he would have a look at the fourth as well while he was intraoperative. Towards this end we have taken him out of the total contact cast on the left we will put him in a wrap with Hydrofera Blue. As I understand things surgery is planned for 7/21 7/27; patient had his surgery last Thursday. 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 Apparently everything went well we did not still disturb that today The area on the left foot actually looks quite good. He has been much less mobile which probably explains this he did not seem to do well in the total contact cast secondary to drainage and maceration I think. We have  been using Hydrofera Blue 11/09/2020 upon evaluation today patient appears to be doing well with regard to his plantar foot ulcer on the left foot. Fortunately there is no evidence of active infection at this time. No fevers, chills, nausea, vomiting, or diarrhea. Overall I think that he is actually doing extremely well. Nonetheless I do believe that he is staying off of this more following the surgery in his right foot that is the reason the left is doing so great. 8/16; left plantar foot wound. This looks smaller than the last time I saw this he is using Hydrofera Blue. The surgical wound on the right foot is being followed by Dr. Doran Durand we did not look at this today. He has surgical shoes on both feet 8/23; left plantar foot wound not as good this week. Surrounding macerated skin and subcutaneous tissue everything looks moist and wet. I do not think he is offloading this adequately. He is using a surgical shoe Apparently the right foot surgical wound is not open although I did not check his foot 8/31; left plantar foot lateral aspect. Much improved this week. He has no maceration. Some improvement in the surface area of the wound but most impressively the depth is come in we are using silver alginate. The patient is a Product/process development scientist. He is asked that we write him a letter so he can go back to work. I have also tried to see if we can write something that will allow him to limit the amount of time that he is on his foot at work. 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 Hopefully the latter can be avoided 9/6; I believe the patient missed an appointment last week. He arrives in today with a wound looking roughly the same certainly no better. Undermining laterally and also inferiorly. We used molecuLight today in training with the patient's permission.. We are using silver alginate 9/21 wound is measuring bigger  this week although this may have to do with the aggressive circumferential debridement last week in response to the blush fluorescence on the MolecuLight. Culture I did last week showed significant MSSA and E. coli. I put him on Augmentin but he has not started it yet. We are also going to send this for compounded antibiotics at [facility]. There is no evidence of systemic infection 9/29; silver alginate. His Keystone arrived. He is completing Augmentin in 2 days. Offloading in a cam boot. Moderate drainage per our intake staff 10/5; using silver alginate. He has been using his [place]. He has completed his Augmentin. Per our intake nurse still a lot of drainage, far too much to consider a total contact cast. Wound measures about the same. He had the same  undermining area that I defined last week from a roughly 11-3. I remove this today 10/12; using silver alginate he is using the [place]. He comes in for a nurse visit hence we are applying [employer] twice a week. Measuring slightly better today and less notable drainage. Extensive debridement of the wound edge last time 10/18; using topical Keystone and silver alginate and a soft cast. Wound measurements about the same. Drainage was through his soft cast. We are changing this twice a week Tuesdays and Friday 10/25; comes in with moderate drainage. Still using Keystone silver alginate and a soft cast. Wound dimensions completely the same.He has a lot of edema in the left leg he has lymphedema. Asking for [name] to consider wrapping him as he cannot get his stocking on over the soft cast 11/2; comes in with moderate to large drainage slightly smaller in terms of width we have been using [place]. His wound looks satisfactory but not much improvement 11/4; patient presents today for obligatory cast change. Has no issues or complaints today. He denies signs of infection. 11/9; patient traveled this weekend to [place], was on the cast quite a bit. Staining of the  cast with black material from his walking boot. Drainage was not quite as bad as we feared. Using silver alginate and Keystone 11/16; we do not have size for cast therefore we have been putting a soft cast on him since the change on Friday. Still a significant amount of drainage necessitating changing twice a week. We have been using the Keystone at cast changes either hard or soft as well as silver alginate Comes in the clinic with things actually looking fairly good improvement in width. He says his offloading is about the same 02/24/2021 upon evaluation today patient actually comes back in and is doing excellent in regard to his foot ulcer this is significantly smaller even compared to the last visit. The soft cast seems to have done extremely well for him which is great news. I do not see any signs of infection minimal debridement will be needed today. Objective Constitutional Well-nourished and well-hydrated in no acute distress. Vitals Time Taken: 9:49 AM, Height: 77 in, Weight: 280 lbs, BMI: 33.2, Temperature: 98.7 F, Pulse: 74 bpm, Respiratory Rate: 17 breaths/min, Blood Pressure: 150/101 mmHg, Capillary Blood Glucose: 136 mg/dl. Respiratory normal breathing without difficulty. Psychiatric this patient is able to make decisions and demonstrates good insight into disease process. Alert and Oriented x 3. pleasant and cooperative. General Notes: Patient's wound bed did require sharp debridement to clear away some of the necrotic debris he tolerated that today without complication and postdebridement this appears to be doing significantly better which is great news. I am actually extremely pleased with where we stand overall at this time. Integumentary (Hair, Skin) Wound #3 status is Open. Original cause of wound was Trauma. The date acquired was: 10/02/2019. The wound has been in treatment 69 weeks. The wound is located on the Left,Lateral Foot. The wound measures 2.8cm length x 2.2cm  width x 0.4cm depth; 4.838cm^2 area and 1.935cm^3 volume. There is Fat Layer (Subcutaneous Tissue) exposed. There is no tunneling or undermining noted. There is a large amount of serosanguineous drainage noted. The wound margin is epibole. There is large (67-100%) red, pink granulation within the wound bed. There is a small (1-33%) amount of necrotic tissue within the wound bed including Adherent Slough. 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 Nurse Visit: - Monday to change soft cast Bathing/ Shower/ Hygiene: May shower with protection but do not get wound dressing(s) wet. Edema Control - Lymphedema / SCD / Other: Elevate legs to the level of the heart or above for 30 minutes daily and/or when sitting, a frequency of: - throughout the day Avoid standing for long periods of time. Exercise regularly Moisturize legs daily. - right leg every night before bed. Compression stocking or Garment 20-30 mm/Hg pressure to: - Apply to right leg in the morning and remove at night. Off-Loading: Other: - minimal weight bearing left foot Other: - Soft cast to left foot Additional Orders / Instructions: Follow Nutritious Diet WOUND #3: - Foot Wound Laterality: Left, Lateral Cleanser: Soap and Water 2 x Per Week/30 Days Discharge Instructions: May shower and wash wound with dial antibacterial soap and water prior to dressing change. Cleanser: Wound Cleanser (Generic) 2 x Per Week/30 Days Discharge Instructions: Cleanse the wound with wound cleanser prior to applying a clean dressing using gauze sponges, not tissue or cotton balls. Peri-Wound Care: Zinc Oxide Ointment 30g tube 2 x Per Week/30 Days Discharge Instructions: Apply Zinc Oxide to periwound with each dressing change as needed. Topical: keystone antibiotic compound 2 x  Per Week/30 Days Discharge Instructions: thin layer to wound bed Prim Dressing: KerraCel Ag Gelling Fiber Dressing, 4x5 in (silver alginate) (Generic) 2 x Per Week/30 Days ary Discharge Instructions: Apply silver alginate to wound bed as instructed Secondary Dressing: ABD Pad, 8x10 2 x Per Week/30 Days Discharge Instructions: Apply over primary dressing as directed. Secondary Dressing: Zetuvit Plus 4x8 in (Generic) 2 x Per Week/30 Days Discharge Instructions: Apply over primary dressing as directed. Com pression Wrap: Kerlix Roll 4.5x3.1 (in/yd) 2 x Per Week/30 Days Discharge Instructions: Apply Kerlix and Coban compression as directed. Com pression Wrap: Coban Self-Adherent Wrap 4x5 (in/yd) 2 x Per Week/30 Days Discharge Instructions: Apply over Kerlix as directed. 1. I would recommend him going continue with the wound care measures as before and the patient is in agreement with that plan this includes the use of the soft cast which honestly I think is doing great patient is very happy with it see any reason to proceed with a total contact cast at this point all things considered he is in agreement with that plan. 2. I am also going to recommend that we have the patient continue with the silver alginate dressing after applying the [facility] antibiotic at this point. We will see patient back for reevaluation in 1 week here in the clinic. If anything worsens or changes patient will contact our office for additional recommendations. Electronic Signature(s) Signed: 02/24/2021 11:54:59 AM By: Worthy Keeler PA-C Entered By: Worthy Keeler on 02/24/2021 11:54:58 -------------------------------------------------------------------------------- SuperBill Details Patient Name: Date of Service: [name], [name] 02/24/2021 Medical Record Number: [id number] Patient Account Number: [id number] Date of Birth/Sex: Treating RN: Nov 17, 1985 (35 y.o. M) Primary Care Provider: [name] Other  Clinician: Referring Provider: Treating Provider/Extender: Jennye Moccasin in Treatment: 69 Diagnosis Coding ICD-10 Codes Code Description E11.621 Type 2 diabetes mellitus with foot ulcer L97.528 Non-pressure chronic ulcer of other part of left foot with other specified severity Physician Procedures : CPT4 Code Description Modifier [id number] - WC PHYS LEVEL 4 - EST PT ICD-10 Diagnosis Description E11.621 Type 2 diabetes mellitus with foot ulcer L97.528 Non-pressure chronic ulcer of other part of left foot with other specified  severity Quantity: 1 Electronic Signature(s) Signed: 02/24/2021 11:55:16 AM By: Worthy Keeler PA-C Entered By: Worthy Keeler on 02/24/2021 11:55:15

## 2021-03-02 NOTE — Progress Notes (Signed)
[place], [name] ([id number]) Visit Report for 03/01/2021 Soft Cast Details Patient Name: Date of Service: [name] D 03/01/2021 8:15 A M Medical Record Number: [id number] Patient Account Number: [id number] Date of Birth/Sex: Treating RN: Sep 04, 1985 (35 y.o. [name] Primary Care Provider: [name] Other Clinician: Referring Provider: Treating Provider/Extender: Dione Plover in Treatment: 74 Procedure Performed for: Wound #3 Left,Lateral Foot Performed By: Clinician Zandra Abts, RN Electronic Signature(s) Signed: 03/01/2021 12:11:10 PM By: Geralyn Corwin DO Signed: 03/02/2021 5:45:41 PM By: Zandra Abts RN, BSN Entered By: Zandra Abts on 03/01/2021 12:00:39 -------------------------------------------------------------------------------- SuperBill Details Patient Name: Date of Service: [name], [name] 03/01/2021 Medical Record Number: [id number] Patient Account Number: [id number] Date of Birth/Sex: Treating RN: 20-Feb-1986 (35 y.o. [name] Primary Care Provider: [name] Other Clinician: Referring Provider: Treating Provider/Extender: Dione Plover in Treatment: 47 Diagnosis Coding ICD-10 Codes Code Description E11.621 Type 2 diabetes mellitus with foot ulcer L97.528 Non-pressure chronic ulcer of other part of left foot with other specified severity Facility Procedures CPT4 Code: [id number] Description: [phone] Application of clubfoot cast with molding or manipulation, long or short leg Modifier: Quantity: 1 Electronic Signature(s) Signed: 03/01/2021 12:11:10 PM By: Geralyn Corwin DO Signed: 03/02/2021 5:45:41 PM By: Zandra Abts RN, BSN Entered By: Zandra Abts on 03/01/2021 12:01:16

## 2021-03-02 NOTE — Progress Notes (Signed)
[place], [name] ([id number]) Visit Report for 03/01/2021 Arrival Information Details Patient Name: Date of Service: [name] D 03/01/2021 8:15 A M Medical Record Number: [id number] Patient Account Number: [id number] Date of Birth/Sex: Treating RN: March 31, 1986 (34 y.o. M) Primary Care Shataya Winkles: [name] Other Clinician: Referring Eligio Angert: Treating Delton Stelle/Extender: Dione Plover in Treatment: 37 Visit Information History Since Last Visit Added or deleted any medications: No Patient Arrived: Ambulatory Any new allergies or adverse reactions: No Arrival Time: 08:35 Had a fall or experienced change in No Accompanied By: 0840 activities of daily living that may affect Transfer Assistance: None risk of falls: Patient Identification Verified: Yes Signs or symptoms of abuse/neglect since last visito No Patient Requires Transmission-Based Precautions: No Hospitalized since last visit: No Patient Has Alerts: No Implantable device outside of the clinic excluding No cellular tissue based products placed in the center since last visit: Pain Present Now: No Electronic Signature(s) Signed: 03/02/2021 5:02:58 PM By: Karie Schwalbe RN Entered By: Karie Schwalbe on 03/01/2021 08:41:46 -------------------------------------------------------------------------------- Encounter Discharge Information Details Patient Name: Date of Service: [name], [name] 03/01/2021 8:15 A M Medical Record Number: [id number] Patient Account Number: [id number] Date of Birth/Sex: Treating RN: 06-07-1985 (35 y.o. [name] Primary Care Jhade Berko: [name] Other Clinician: Referring Aroush Chasse: Treating Markeith Jue/Extender: Dione Plover in Treatment: 50 Encounter Discharge Information Items Discharge Condition: Stable Ambulatory Status: Ambulatory Discharge Destination: Home Transportation: Private Auto Accompanied By: alone Schedule  Follow-up Appointment: Yes Clinical Summary of Care: Patient Declined Electronic Signature(s) Signed: 03/02/2021 5:45:41 PM By: Zandra Abts RN, BSN Entered By: Zandra Abts on 03/01/2021 12:01:10 -------------------------------------------------------------------------------- Wound Assessment Details Patient Name: Date of Service: [name], [name] 03/01/2021 8:15 A M Medical Record Number: [id number] Patient Account Number: [id number] Date of Birth/Sex: Treating RN: 01/24/1986 (35 y.o. M) Primary Care Ginamarie Banfield: [name] Other Clinician: Referring Lacy Taglieri: Treating Uliana Brinker/Extender: Dione Plover in Treatment: 70 Wound Status Wound Number: 3 Primary Etiology: Diabetic Wound/Ulcer of the Lower Extremity Wound Location: Left, Lateral Foot Wound Status: Open Wounding Event: Trauma Comorbid History: Type II Diabetes Date Acquired: 10/02/2019 Weeks Of Treatment: 70 Clustered Wound: No Wound Measurements Length: (cm) 2.8 Width: (cm) 2.2 Depth: (cm) 0.4 Area: (cm) 4.838 Volume: (cm) 1.935 % Reduction in Area: -193.4% % Reduction in Volume: -1072.7% Epithelialization: Small (1-33%) Tunneling: No Undermining: No Wound Description Classification: Grade 2 Wound Margin: Epibole Exudate Amount: Large Exudate Type: Serosanguineous Exudate Color: red, brown Foul Odor After Cleansing: No Slough/Fibrino Yes Wound Bed Granulation Amount: Large (67-100%) Exposed Structure Granulation Quality: Red, Pink Fascia Exposed: No Necrotic Amount: Small (1-33%) Fat Layer (Subcutaneous Tissue) Exposed: Yes Necrotic Quality: Adherent Slough Tendon Exposed: No Muscle Exposed: No Joint Exposed: No Bone Exposed: No Treatment Notes Wound #3 (Foot) Wound Laterality: Left, Lateral Cleanser Soap and Water Discharge Instruction: May shower and wash wound with dial antibacterial soap and water prior to dressing change. Wound Cleanser Discharge  Instruction: Cleanse the wound with wound cleanser prior to applying a clean dressing using gauze sponges, not tissue or cotton balls. Peri-Wound Care Zinc Oxide Ointment 30g tube Discharge Instruction: Apply Zinc Oxide to periwound with each dressing change as needed. Topical keystone antibiotic compound Discharge Instruction: thin layer to wound bed Primary Dressing KerraCel Ag Gelling Fiber Dressing, 4x5 in (silver alginate) Discharge Instruction: Apply silver alginate to wound bed as instructed Secondary Dressing ABD Pad, 8x10 Discharge Instruction: Apply over primary dressing as directed. Zetuvit Plus 4x8 in Discharge Instruction:  Apply over primary dressing as directed. Secured With Compression Wrap Kerlix Roll 4.5x3.1 (in/yd) Discharge Instruction: Apply Kerlix and Coban compression as directed. Coban Self-Adherent Wrap 4x5 (in/yd) Discharge Instruction: Apply over Kerlix as directed. Compression Stockings Add-Ons Electronic Signature(s) Signed: 03/02/2021 5:02:58 PM By: Dellie Catholic RN Entered By: Dellie Catholic on 03/01/2021 08:57:21 -------------------------------------------------------------------------------- Vitals Details Patient Name: Date of Service: [name], [name] 03/01/2021 8:15 A M Medical Record Number: [id number] Patient Account Number: [id number] Date of Birth/Sex: Treating RN: 1985-04-17 (35 y.o. M) Primary Care Rambo Sarafian: [name] Other Clinician: Referring Jacquelina Hewins: Treating Magdeline Prange/Extender: [name] in Treatment: 66 Vital Signs Time Taken: 08:40 Temperature (F): 98.4 Height (in): 77 Pulse (bpm): 84 Weight (lbs): 280 Respiratory Rate (breaths/min): 18 Body Mass Index (BMI): 33.2 Blood Pressure (mmHg): 140/91 Reference Range: 80 - 120 mg / dl Electronic Signature(s) Signed: 03/02/2021 5:02:58 PM By: Dellie Catholic RN Entered By: Dellie Catholic on 03/01/2021 08:42:45

## 2021-03-03 ENCOUNTER — Other Ambulatory Visit: Payer: Self-pay

## 2021-03-03 ENCOUNTER — Encounter (HOSPITAL_BASED_OUTPATIENT_CLINIC_OR_DEPARTMENT_OTHER): Payer: BC Managed Care – PPO | Admitting: Internal Medicine

## 2021-03-03 DIAGNOSIS — E11621 Type 2 diabetes mellitus with foot ulcer: Secondary | ICD-10-CM | POA: Diagnosis not present

## 2021-03-03 NOTE — Progress Notes (Signed)
[name], Max ([id number]) Visit Report for 03/03/2021 HPI Details Patient Name: Date of Service: [name] 03/03/2021 7:30 A M Medical Record Number: [id number] Patient Account Number: [id number] Date of Birth/Sex: Treating RN: 04-17-1985 (35 y.o. 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 He comes in for review of a wound over the plantar fifth metatarsal head extending into the lateral part of the foot. He was followed for this previously by his podiatrist Dr. Cornelius Moras. As the patient tells his story he went to see podiatry first for a swelling he developed on the lateral part of his fifth metatarsal head in May. He states this was "open" by podiatry and the area closed. He was followed up in June and it was again opened callus removed and it closed promptly. There were plans being made for surgery on the fifth metatarsal head in June however his blood sugar was apparently too high for anesthesia. Apparently the area was debrided and opened again in June and it is never closed since. Looking over the records from podiatry I am really not able to follow this. It was clear when he was first seen it was before 5/14 at that point he already had a wound. By 5/17 the ulcer was resolved. I do not see anything about a procedure. On 5/28 noted to have pre-ulcerative moderate keratosis. X-ray noted 1/5 contracted toe and tailor's bunion and metatarsal deformity. On a visit date on 09/28/2018 the dorsal part of the left foot it healed and resolved. There was concern about swelling in his lower extremity he was sent to the ER.. As far as I can tell he was seen in the ER on 7/12 with an ulcer on his left foot. A DVT rule out of the left  leg was negative. I do not think I have complete records from podiatry but I am not able to verify the procedures this patient states he had. He states after the last procedure the wound has never closed although I am not able to follow this in the records I have from podiatry. He has not had a recent x-ray The patient has been using Neosporin on the wound. He is wearing a Darco shoe. He is still very active up on his foot working and exercising. Past medical history; type 2 diabetes ketosis-prone, leg swelling with a negative DVT study in July. Non-smoker ABI in our clinic was 0.85 on the left 10/16; substantial wound on the plantar left fifth met head extending laterally almost to the dorsal fifth MTP. We have been using silver alginate we gave him a Darco forefoot off loader. An x-ray did not show evidence of osteomyelitis did note soft tissue emphysema which I think was due to gas tracking through an open wound. There is no doubt in my mind he requires an MRI 10/23; MRI not booked until 3 November at the earliest this is largely due to his glucose sensor in the right arm. We have been using silver alginate. There has been an improvement 10/29; I am still not exactly sure when his MRI is booked for. He says it is the third but it is the 10th in epic. This definitely needs to be done. He is running a low-grade fever today but no other symptoms. No real improvement in  the 1 02/26/2019 patient presents today for a follow-up visit here in our clinic he is last been seen in the clinic on October 29. Subsequently we were working on getting MRI to evaluate and see what exactly was going on and where we would need to go from the standpoint of whether or not he had osteomyelitis and again what treatments were going be required. Subsequently the patient ended up being admitted to the hospital on 02/07/2019 and was discharged on 02/14/2019. This is a somewhat interesting admission with a discharge diagnosis  of pneumonia due to COVID-19 although he was positive for COVID-19 when tested at the urgent care but negative x2 when he was actually in the hospital. With that being said he did have acute respiratory failure with hypoxia and it was noted he also have a left foot ulceration with osteomyelitis. With that being said he did require oxygen for his pneumonia and I level 4 L. He was placed on antivirals and steroids for the COVID-19. He was also transferred to the [facility] at one point. Nonetheless he did subsequently discharged home and since being home has done much better in that regard. The CT angiogram did not show any pulmonary embolism. With regard to the osteomyelitis the patient was placed on vancomycin and Zosyn while in the hospital but has been changed to Augmentin at discharge. It was also recommended that he follow- up with wound care and podiatry. Podiatry however wanted him to see [name] according to the patient prior to them doing anything further. His hemoglobin A1c was 9.9 as noted in the hospital. Have an MRI of the left foot performed while in the hospital on 02/04/2019. 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 There is an overlying plantar open wound noted an abscess tracking back along the lateral aspect of the fifth metatarsal shaft. There is otherwise diffuse cellulitis and mild fasciitis without findings of polymyositis. The patient did have recently pneumonia secondary to COVID-19 I looked in the chart through epic and it does appear that the patient may need to have an additional x-ray just to ensure everything is cleared and that he has no airspace disease prior to putting him into the [name]. 03/05/2019; patient was readmitted to the clinic last week. He was hospitalized twice for a viral upper respiratory tract infection from 11/1 through 11/4 and then 11/5 through 11/12  ultimately this turned out to be Covid pneumonitis. Although he was discharged on oxygen he is not using it. He says he feels fine. He has no exercise limitation no cough no sputum. His O2 sat in our clinic today was 100% on room air. 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 He received Vanco and Zosyn in the hospital and then was discharged on 2 weeks of Augmentin. I do not see any relevant cultures. He was supposed to follow-up with infectious disease but I do not see that he has an appointment. 12/8; patient saw Dr. Novella Olive of infectious disease last week. He felt that he had had adequate antibiotic therapy. He did not go to follow-up with Dr. Amalia Hailey of podiatry and I have again talked to him about the pros and cons of this. He does not want to consider a ray amputation of this time. He is aware of the risks of recurrence,  migration etc. He started HBO today and tolerated this well. He can complete the Augmentin that I gave him last week. I have looked over the lab work that Dr. Chana Bode ordered his C-reactive protein was 3.3 and his sedimentation rate was 17. The C-reactive protein is never really been measurably that high in this patient 12/15; not much change in the wound today however he has undermining along the lateral part of the foot again more extensively than last week. He has some rims of epithelialization. We have been using silver alginate. He is undergoing hyperbarics but did not dive today 12/18; in for his obligatory first total contact cast change. Unfortunately there was pus coming from the undermining area around his fifth metatarsal head. This was cultured but will preclude reapplication of a cast. He is seen in conjunction with HBO 12/24; patient had staph lugdunensis in the wound in the undermining area laterally last time. We put him on doxycycline which should  have covered this. The wound looks better today. I am going to give him another week of doxycycline before reattempting the total contact cast 12/31; the patient is completing antibiotics. Hemorrhagic debris in the distal part of the wound with some undermining distally. He also had hyper granulation. Extensive debridement with a #5 curette. The infected area that was on the lateral part of the fifth met head is closed over. I do not think he needs any more antibiotics. Patient was seen prior to HBO. Preparations for a total contact cast were made in the cast will be placed post hyperbarics 04/11/19; once again the patient arrives today without complaint. He had been in a cast all week noted that he had heavy drainage this week. This resulted in large raised areas of macerated tissue around the wound 1/14; wound bed looks better slightly smaller. Hydrofera Blue has been changing himself. He had a heavy drainage last week which caused a lot of maceration around the wound so I took him out of a total contact cast he says the drainage is actually better this week He is seen today in conjunction with HBO 1/21; returns to clinic. He was up in [place] for a day or 2 attending a funeral. He comes back in with the wound larger and with a large area of exposed bone. He had osteomyelitis and septic arthritis of the fifth left metatarsal head while he was in hospital. He received IV antibiotics in the hospital for a prolonged period of time then 3 weeks of Augmentin. Subsequently I gave him 2 weeks of doxycycline for more superficial wound infection. When I saw this last week the wound was smaller the surface of the wound looks satisfactory. 1/28; patient missed hyperbarics today. Bone biopsy I did last time showed Enterococcus faecalis and Staphylococcus lugdunensis . He has a wide area of exposed bone. We are going to use silver alginate as of today. I had another ethical discussion with the patient. 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 In this situation I think the likelihood of healing this is low. Therefore I have recommended a ray amputation and with the patient's agreement I have referred him to Dr. Doran Durand. The other issue is that his compliance with hyperbarics has been minimal because of his work schedule and given his underlying decision I am going to stop this today READMISSION 10/24/2019 MRI 09/29/2019 left foot IMPRESSION: 1. Apparent skin ulceration inferior and lateral to the 5th metatarsal base with underlying heterogeneous T2 signal and  enhancement in the subcutaneous fat. Small peripherally enhancing fluid collections along the plantar and lateral aspects of the 5th metatarsal base suspicious for abscesses. 2. Interval amputation through the mid 5th metatarsal with nonspecific low-level marrow edema and enhancement. Given the proximity to the adjacent soft tissue inflammatory changes, osteomyelitis cannot be excluded. 3. The additional bones appear unremarkable. MRI 09/29/2019 right foot IMPRESSION: 1. Soft tissue ulceration lateral to the 5th MTP joint. There is low-level T2 hyperintensity within the 4th and 5th metatarsal heads and adjacent proximal phalanges without abnormal T1 signal or cortical destruction. These findings are nonspecific and could be seen with early marrow edema, hyperemia or early osteomyelitis. No evidence of septic joint. 2. Mild tenosynovitis and synovial enhancement associated with the extensor digitorum tendons at the level of the midfoot. 3. Diffuse low-level muscular T2 hyperintensity and enhancement, most consistent with diabetic myopathy. LEFT FOOT BONE Methicillin resistant staphylococcus aureus Staphylococcus lugdunensis MIC MIC CIPROFLOXACIN >=8 RESISTANT Resistant <=0.5 SENSI... Sensitive CLINDAMYCIN <=0.25 SENS... Sensitive >=8 RESISTANT Resistant ERYTHROMYCIN >=8 RESISTANT Resistant >=8  RESISTANT Resistant GENTAMICIN <=0.5 SENSI... Sensitive <=0.5 SENSI... Sensitive Inducible Clindamycin NEGATIVE Sensitive NEGATIVE Sensitive OXACILLIN >=4 RESISTANT Resistant 2 SENSITIVE Sensitive RIFAMPIN <=0.5 SENSI... Sensitive <=0.5 SENSI... Sensitive TETRACYCLINE <=1 SENSITIVE Sensitive <=1 SENSITIVE Sensitive TRIMETH/SULFA <=10 SENSIT Sensitive <=10 SENSIT Sensitive ... Marland Kitchen.. VANCOMYCIN 1 SENSITIVE Sensitive <=0.5 SENSI... Sensitive Right foot bone . Component 3 wk ago Specimen Description BONE Special Requests RIGHT 4 METATARSAL SAMPLE B Gram Stain NO WBC SEEN NO ORGANISMS SEEN Culture RARE METHICILLIN RESISTANT STAPHYLOCOCCUS AUREUS NO ANAEROBES ISOLATED Performed at [facility], [place] [address] [place], [place] [address] Report Status 10/08/2019 FINAL Organism ID, Bacteria METHICILLIN RESISTANT STAPHYLOCOCCUS AUREUS Resulting Agency [facility] Susceptibility Methicillin resistant staphylococcus aureus MIC CIPROFLOXACIN >=8 RESISTANT Resistant CLINDAMYCIN <=0.25 SENS... Sensitive ERYTHROMYCIN >=8 RESISTANT Resistant GENTAMICIN <=0.5 SENSI... Sensitive Inducible Clindamycin NEGATIVE Sensitive OXACILLIN >=4 RESISTANT Resistant RIFAMPIN <=0.5 SENSI... Sensitive TETRACYCLINE <=1 SENSITIVE Sensitive TRIMETH/SULFA <=10 SENSIT Sensitive ... VANCOMYCIN 1 SENSITIVE Sensitive This is a patient we had in clinic earlier this year with a wound over his left fifth metatarsal head. 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 Doran Durand on 05/16/2019. He seems to have developed problems with wounds on his bilateral feet in June 2021 although he may have had problems earlier than this. He was in an urgent  care with a right foot ulcer on 09/26/2019 and given a course of doxycycline. This was apparently after having trouble getting into see orthopedics. He was seen by podiatry on 09/28/2019 noted to have bilateral lower extremity ulcers including the left lateral fifth metatarsal base and the right subfifth met head. It was noted that had purulent drainage at that time. He required hospitalization from 6/20 through 7/2. This was because of worsening right foot wounds. He underwent bilateral operative incision and drainage and bone biopsies bilaterally. Culture results are listed above. He has been referred back to clinic by Dr. Jacqualyn Posey of podiatry. He is also followed by Dr. Megan Salon who saw him yesterday. He was discharged from hospital on Zyvox Flagyl and Levaquin and yesterday changed to doxycycline Flagyl and Levaquin. His inflammatory markers on 6/26 showed a sedimentation rate of 129 and a C-reactive protein of 5. This is improved  to 14 and 1.3 respectively. This would indicate improvement. ABIs in our clinic today were 1.23 on the right and 1.20 on the left 11/01/2019 on evaluation today patient appears to be doing fairly well in regard to the wounds on his feet at this point. Fortunately there is no signs of active infection at this time. No fevers, chills, nausea, vomiting, or diarrhea. He currently is seeing infectious disease and still under their care at this point. Subsequently he also has both wounds which she has not been using collagen on as he did not receive that in his packaging he did not call us and let us know that. Apparently that just was missed on the order. Nonetheless we will get that straightened out today. 8/9-Patient returns for bilateral foot wounds, using Prisma with hydrogel moistened dressings, and the wounds appear stable. Patient using surgical shoes, avoiding much pressure or weightbearing as much as possible 8/16; patient has bilateral foot wounds. 1 on the right  lateral foot proximally the other is on the left mid lateral foot. Both required debridement of callus and thick skin around the wounds. We have been using silver collagen 8/27; patient has bilateral lateral foot wounds. The area on the left substantially surrounded by callus and dry skin. This was removed from the wound edge. The underlying wound is small. The area on the right measured somewhat smaller today. 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 Unfortunately I did not update his antibiotics during today's visit. 9/10 I reviewed Dr. Hale Bogus last notes he felt he had completed antibiotics his inflammatory markers were reasonably well controlled. He has a small wound on the lateral left foot and a tiny area on the right which is just above closed. He is using Hydrofera Blue with border foam he has bilateral surgical shoes 9/24; 2 week f/u. doing well. right foot is closed. left foot still undermined. 10/14; right foot remains closed at the fifth met head. The area over the base of the left fifth metatarsal has a small open area but considerable undermining towards the plantar foot. Thick callus skin around this suggests an adequate pressure relief. We have talked about this. He says he is going to go back into his cam boot. I suggested a total contact cast he did not seem enamored with this suggestion 10/26; left foot base of the fifth metatarsal. Same condition as last time. He has skin over the area with an open wound however the skin is not adherent. He went to see Dr. Earleen Newport who did an x-ray and culture of his foot I have not reviewed the x-ray but the patient was not told anything. He is on doxycycline 11/11; since the patient was last here he was in the emergency room on 10/30 he was concerned about swelling in the left foot. They did not do any cultures or x-rays. They changed his antibiotics to cephalexin. Previous  culture showed group B strep. The cephalexin is appropriate as doxycycline has less than predictable coverage. Arrives in clinic today with swelling over this area under the wound. He also has a new wound on the right fifth metatarsal head 11/18; the patient has a difficult wound on the lateral aspect of the left fifth metatarsal head. The wound was almost ballotable last week I opened it slightly expecting to see purulence however there was just bleeding. I cultured this this was negative. X-ray unchanged. We are trying to get an MRI but  I am not sure were going to be able to get this through his insurance. He also has an area on the right lateral fifth metatarsal head this looks healthier 12/3; the patient finally got our MRI. Surprisingly this did not show osteomyelitis. I did show the soft tissue ulceration at the lateral plantar aspect of the fifth metatarsal base with a tiny residual 6 mm abscess overlying the superficial fascia I have tried to culture this area I have not been able to get this to grow anything. Nevertheless the protruding tissue looks aggravated. I suspect we should try to treat the underlying "abscess with broad-spectrum antibiotics. I am going to start him on Levaquin and Flagyl. He has much less edema in his legs and I am going to continue to wrap his legs and see him weekly 12/10. I started Levaquin and Flagyl on him last week. He just picked up the Flagyl apparently there was some delay. The worry is the wound on the left fifth metatarsal base which is substantial and worsening. His foot looks like he inverts at the ankle making this a weightbearing surface. Certainly no improvement in fact I think the measurements of this are somewhat worse. We have been using 12/17; he apparently just got the Levaquin yesterday this is 2 weeks after the fact. He has completed the Flagyl. The area over the left fifth metatarsal base still has protruding granulation tissue although it does  not look quite as bad as it did some weeks ago. He has severe bilateral lymphedema although we have not been treating him for wounds on his legs this is definitely going to require compression. There was so much edema in the left I did not wish to put him in a total contact cast today. I am going to increase his compression from 3-4 layer. The area on the right lateral fifth met head actually look quite good and superficial. 12/23; patient arrived with callus on the right fifth met head and the substantial hyper granulated callused wound on the base of his fifth metatarsal. He says he is completing his Levaquin in 2 days but I do not think that adds up with what I gave him but I will have to double check this. We are using Hydrofera Blue on both areas. My plan is to put the left leg in a cast the week after New Year's 04/06/2020; patient's wounds about the same. Right lateral fifth metatarsal head and left lateral foot over the base of the fifth metatarsal. There is undermining on the left lateral foot which I removed before application of total contact cast continuing with Hydrofera Blue new. Patient tells me he was seen by endocrinology today lab work was done [Dr. Kerr]. Also wondering whether he was referred to cardiology. I went over some lab work from previously does not have chronic renal failure certainly not nephrotic range proteinuria he does have very poorly controlled diabetes but this is not his most updated lab work. Hemoglobin A1c has been over 11 1/10; the patient had a considerable amount of leakage towards mid part of his left foot with macerated skin however the wound surface looks better the area on the right lateral fifth met head is better as well. I am going to change the dressing on the left foot under the total contact cast to silver alginate, continue with Hydrofera Blue on the right. 1/20; patient was in the total contact cast for 10 days. Considerable amount of drainage  although the skin around the  wound does not look too bad on the left foot. The area on the right fifth metatarsal head is closed. Our nursing staff reports large amount of drainage out of the left lateral foot wound 1/25; continues with copious amounts of drainage described by our intake staff. PCR culture I did last week showed E. coli and Enterococcus faecalis and low quantities. Multiple resistance genes documented including extended spectrum beta lactamase, MRSA, MRSE, quinolone, tetracycline. The wound is not quite as good this week as it was 5 days ago but about the same size 2/3; continues with copious amounts of malodorous drainage per our intake nurse. The PCR culture I did 2 weeks ago showed E. coli and low quantities of Enterococcus. There were multiple resistance genes detected. I put Neosporin on him last week although this does not seem to have helped. The wound is slightly deeper today. Offloading continues to be an issue here although with the amount of drainage she has a total contact cast is just not going to work 2/10; moderate amount of drainage. Patient reports he cannot get his stocking on over the dressing. I told him we have to do that the nurse gave him suggestions on how to make this work. The wound is on the bottom and lateral part of his left foot. Is cultured predominantly grew low amounts of Enterococcus, E. coli and anaerobes. There were multiple resistance genes detected including extended spectrum beta lactamase, quinolone, tetracycline. I could not think of an easy oral combination to address this so for now I am going to do topical antibiotics provided by [facility] I think the main agents here are vancomycin and an aminoglycoside. We have to be able to give him access to the wounds to get the topical antibiotic on 2/17; moderate amount of drainage this is unchanged. He has his Keystone topical antibiotic against the deep tissue culture organisms. He has been using  this and changing the dressing daily. Silver alginate on the wound surface. 2/24; using Keystone antibiotic with silver alginate on the top. He had too much drainage for a total contact cast at one point although I think that is improving and I think in the next week or 2 it might be possible to replace a total contact cast I did not do this today. In general the wound surface looks healthy however he continues to have thick rims of skin and subcutaneous tissue around the wide area of the circumference which I debrided 06/04/2020 upon evaluation today patient appears to be doing well in regard to his wound. I do feel like he is showing signs of improvement. There is little bit of callus and dead tissue around the edges of the wound as well as what appears to be a little bit of a sinus tract that is off to the side laterally I would perform debridement to clear that away today. 3/17; left lateral foot. The wound looks about the same as I remember. Not much depth surface looks healthy. No evidence of infection 3/25; left lateral foot. Wound surface looks about the same. Separating epithelium from the circumference. There really is no evidence of infection here however not making progress by my view 3/29; left lateral foot. Surface of the wound again looks reasonably healthy still thick skin and subcutaneous tissue around the wound margins. There is no evidence of infection. One of the concerns being brought up by the nurses has again the amount of drainage vis--vis continued use of a total contact cast 4/5; left lateral  foot at roughly the base of the fifth metatarsal. Nice healthy looking granulated tissue with rims of epithelialization. The overall wound measurements are not any better but the tissue looks healthy. The only concern is the amount of drainage although he has no surrounding maceration with what we have been doing recently to absorb fluid and protect his skin. He also has lymphedema.  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 He states that the kerlix/coban under the TCC rolled down and could not get it back up. He has been using an offloading scooter and has somehow hurt his right foot using this device. This happened last week. He states that the side of his right foot developed a blister and opened. The top of his foot also has a few small open wounds he thinks is due to his socks rubbing in his shoes. He has not been using any dressings to the wound. He denies purulent drainage, fever/chills or erythema to the wounds. 4/22; patient presents for 1 week follow-up. He developed new wounds to the right foot that were evaluated at last clinic visit. He continues to have a total contact cast to the left leg and he reports no issues. He has been using silver collagen to the right foot wounds with no issues. He denies purulent drainage, fever/chills or erythema to the right foot wounds. He has no complaints today 4/25; patient presents for 1 week follow-up. He has a total contact cast of the left leg and reports no issues. He has been using silver alginate to the right foot wound. He denies purulent drainage, fever/chills or erythema to the right foot wounds. 5/2 patient presents for 1 week follow-up. T contact cast on the left. The wound which is on the base of the plantar foot at the base of the fifth metatarsal otal actually looks quite good and dimensions continue to gradually contract. HOWEVER the area on the right lateral fifth metatarsal head is much larger than what I remember from 2 weeks ago. Once more is he has significant levels of hypergranulation. Noteworthy that he had this same hyper granulated response on his wound on the  left foot at one point in time. So much so that he I thought there was an underlying fluid collection. Based on this I think this just needs debridement. 5/9; the wound on the left actually continues to be gradually smaller with a healthy surface. Slight amount of drainage and maceration of the skin around but not too bad. However he has a large wound over the right fifth metatarsal head very much in the same configuration as his left foot wound was initially. I used silver nitrate to address the hyper granulated tissue no mechanical debridement 5/16; area on the left foot did not look as healthy this week deeper thick surrounding macerated skin and subcutaneous tissue. The area on the right foot fifth met head was about the same The area on the right ankle that we identified last week is completely broken down into an open wound presumably a stocking rubbing issue 5/23; patient has been using a total contact cast to the left side. He has been using silver alginate underneath. He has also been using silver alginate to the right foot wounds.  He has no complaints today. He denies any signs of infection. 5/31; the left-sided wound looks some better measure smaller surface granulation looks better. We have been using silver alginate under the total contact cast The large area on his right fifth met head and right dorsal foot look about the same still using silver alginate 6/6; neither side is good as I was hoping although the surface area dimensions are better. A lot of maceration on his left and right foot around the wound edge. Area on the dorsal right foot looks better. He says he was traveling. I am not sure what does the amount of maceration around the plantar wounds may be drainage issues 6/13; in general the wound surfaces look quite good on both sides. Macerated skin and raised edges around the wound required debridement although in general especially on the left the surface area seems  improved. The area on the right dorsal ankle is about the same I thought this would not be such a problem to close 6/20; not much change in either wound although the one on the right looks a little better. Both wounds have thick macerated edges to the skin requiring debridements. We have been using silver alginate. The area on the dorsal right ankle is still open I thought this would be closed. 6/28; patient comes in today with a marked deterioration in the right foot wound fifth met head. Wide area of exposed bone this is a drastic change from last time. The area on the left there we have been casting is stagnant. We have been using silver alginate in both wound areas. 7/5; bone culture I did for PCR last time was positive for Pseudomonas, group B strep, Enterococcus and Staph aureus. There was no suggestion of methicillin resistance or ampicillin resistant genes. This was resistant to tetracycline however He comes into the clinic today with the area over his right plantar fifth metatarsal head which had been doing so well 2 weeks ago completely necrotic feeling bone. I do not know that this is going to be salvageable. The left foot wound is certainly no smaller but it has a better surface and is superficial. 7/8; patient called in this morning to say that his total contact cast was rubbing against his foot. He states he is doing fine overall. He denies signs of infection. 7/12; continued deterioration in the wound over the right fifth metatarsal head crumbling bone. This is not going to be salvageable. The patient agrees and wants to be referred to Dr. Doran Durand which we will attempt to arrange as soon as possible. I am going to continue him on antibiotics as long as that takes so I will renew those today. The area on the left foot which is the base of the fifth metatarsal continues to look somewhat better. Healthy looking tissue no depth no debridement is necessary here. 7/20; the patient was kindly  seen by Dr. Doran Durand of orthopedics on 10/19/2020. 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 Towards this end we have taken him out of the total contact cast on the left we will put him in a wrap with Hydrofera Blue. As I understand things surgery is planned for 7/21 7/27; patient had his surgery last Thursday. 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 Apparently everything went well we did not still disturb that today The area on the left foot actually looks  quite good. He has been much less mobile which probably explains this he did not seem to do well in the total contact cast secondary to drainage and maceration I think. 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 Fortunately there is no evidence of active infection at this time. No fevers, chills, nausea, vomiting, or diarrhea. Overall I think that he is actually doing extremely well. Nonetheless I do believe that he is staying off of this more following the surgery in his right foot that is the reason the left is doing so great. 8/16; left plantar foot wound. This looks smaller than the last time I saw this he is using Hydrofera Blue. The surgical wound on the right foot is being followed by Dr. Doran Durand we did not look at this today. He has surgical shoes on both feet 8/23; left plantar foot wound not as good this week. Surrounding macerated skin and subcutaneous tissue everything looks moist and wet. I do not think he is offloading this adequately. He is using a surgical shoe Apparently the right foot surgical wound is not open although I did not check his foot 8/31; left plantar foot lateral aspect. Much improved this week. He has no maceration. Some improvement in the surface area of the wound but most impressively the depth is come in we are  using silver alginate. The patient is a Product/process development scientist. He is asked that we write him a letter so he can go back to work. I have also tried to see if we can write something that will allow him to limit the amount of time that he is on his foot at work. 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 Hopefully the latter can be avoided 9/6; I believe the patient missed an appointment last week. He arrives in today with a wound looking roughly the same certainly no better. Undermining laterally and also inferiorly. We used molecuLight today in training with the patient's permission.. We are using silver alginate 9/21 wound is measuring bigger this week although this may have to do with the aggressive circumferential debridement last week in response to the blush fluorescence on the MolecuLight. Culture I did last week showed significant MSSA and E. coli. I put him on Augmentin but he has not started it yet. We are also going to send this for compounded antibiotics at [facility]. There is no evidence of systemic infection 9/29; silver alginate. His Keystone arrived. He is completing Augmentin in 2 days. Offloading in a cam boot. Moderate drainage per our intake staff 10/5; using silver alginate. He has been using his [place]. He has completed his Augmentin. Per our intake nurse still a lot of drainage, far too much to consider a total contact cast. Wound measures about the same. He had the same undermining area that I defined last week from a roughly 11-3. I remove this today 10/12; using silver alginate he is using the [place]. He comes in for a nurse visit hence we are applying [employer] twice a week. Measuring slightly better today and less notable drainage. Extensive debridement of the wound edge last time 10/18; using topical Keystone and silver alginate and a soft cast. Wound measurements about the same. Drainage was  through his soft cast. We are changing this twice a week Tuesdays and  Friday 10/25; comes in with moderate drainage. Still using Keystone silver alginate and a soft cast. Wound dimensions completely the same.He has a lot of edema in the left leg he has lymphedema. Asking for [name] to consider wrapping him as he cannot get his stocking on over the soft cast 11/2; comes in with moderate to large drainage slightly smaller in terms of width we have been using [place]. His wound looks satisfactory but not much improvement 11/4; patient presents today for obligatory cast change. Has no issues or complaints today. He denies signs of infection. 11/9; patient traveled this weekend to [place], was on the cast quite a bit. Staining of the cast with black material from his walking boot. Drainage was not quite as bad as we feared. Using silver alginate and Keystone 11/16; we do not have size for cast therefore we have been putting a soft cast on him since the change on Friday. Still a significant amount of drainage necessitating changing twice a week. We have been using the Keystone at cast changes either hard or soft as well as silver alginate Comes in the clinic with things actually looking fairly good improvement in width. He says his offloading is about the same 02/24/2021 upon evaluation today patient actually comes back in and is doing excellent in regard to his foot ulcer this is significantly smaller even compared to the last visit. The soft cast seems to have done extremely well for him which is great news. I do not see any signs of infection minimal debridement will be needed today. 11/30; left lateral foot much improved half a centimeter improvement in surface area. No evidence of infection. He seems to be doing better with the soft cast in the TCC therefore we will continue with this. He comes back in later in the week for a change with the nurses. This is due to drainage Electronic Signature(s) Signed:  03/03/2021 4:53:46 PM By: [name] MD Entered By: [name] on 03/03/2021 08:20:38 -------------------------------------------------------------------------------- Soft Cast Details Patient Name: Date of Service: [name], [name] 03/03/2021 7:30 A M Medical Record Number: [id number] Patient Account Number: [id number] Date of Birth/Sex: Treating RN: 1985/07/05 (35 y.o. [name] Primary Care Provider: [name] Other Clinician: Referring Provider: Treating Provider/Extender: [name] in Treatment: 70 Procedure Performed for: Wound #3 Left,Lateral Foot Performed By: Clinician [name], RN Post Procedure Diagnosis Same as Pre-procedure Electronic Signature(s) Signed: 03/03/2021 4:53:46 PM By: [name] MD Signed: 03/03/2021 5:07:48 PM By: [name] RN, BSN Entered By: [name] on 03/03/2021 08:13:01 -------------------------------------------------------------------------------- Physical Exam Details Patient Name: Date of Service: [name], [name] 03/03/2021 7:30 A M Medical Record Number: [id number] Patient Account Number: [id number] Date of Birth/Sex: Treating RN: 1986/02/27 (35 y.o. M) Primary Care Provider: [name] Other Clinician: Referring Provider: Treating Provider/Extender: [name] in Treatment: 91 Constitutional Sitting or standing Blood Pressure is within target range for patient.. Pulse regular and within target range for patient.Marland Kitchen Respirations regular, non-labored and within target range.. Temperature is normal and within the target range for the patient.Marland Kitchen Appears in no distress. Notes Wound exam; a nice improvement since last time I saw this 2 weeks ago. No debridement is required measurements are smaller. Granulation on the wound surface looks healthy on even under illumination. I see no evidence of infection. No surrounding erythema. No purulent  drainage Electronic Signature(s) Signed: 03/03/2021 4:53:46 PM By: [name] MD Entered By: [name] on 03/03/2021 08:22:13 --------------------------------------------------------------------------------  Physician Orders Details Patient Name: Date of Service: [name] 03/03/2021 7:30 A M Medical Record Number: [id number] Patient Account Number: [id number] Date of Birth/Sex: Treating RN: 07-09-1985 (35 y.o. [name] Primary Care Provider: [name] Other Clinician: Referring Provider: Treating Provider/Extender: [name] in Treatment: 68 Verbal / Phone Orders: No Diagnosis Coding ICD-10 Coding Code Description E11.621 Type 2 diabetes mellitus with foot ulcer L97.528 Non-pressure chronic ulcer of other part of left foot with other specified severity Follow-up Appointments Return Appointment in 1 week. Nurse Visit: - Friday to change soft cast Bathing/ Shower/ Hygiene May shower with protection but do not get wound dressing(s) wet. Edema Control - Lymphedema / SCD / Other Bilateral Lower Extremities Elevate legs to the level of the heart or above for 30 minutes daily and/or when sitting, a frequency of: - throughout the day Avoid standing for long periods of time. Exercise regularly Moisturize legs daily. - right leg every night before bed. Compression stocking or Garment 20-30 mm/Hg pressure to: - Apply to right leg in the morning and remove at night. Off-Loading Other: - minimal weight bearing left foot Other: - Soft cast to left foot - wrap leg with kerlix/coban to reduce swelling Additional Orders / Instructions Follow Nutritious Diet Wound Treatment Wound #3 - Foot Wound Laterality: Left, Lateral Cleanser: Soap and Water 2 x Per Week/30 Days Discharge Instructions: May shower and wash wound with dial antibacterial soap and water prior to dressing change. Cleanser: Wound Cleanser (Generic) 2 x Per Week/30  Days Discharge Instructions: Cleanse the wound with wound cleanser prior to applying a clean dressing using gauze sponges, not tissue or cotton balls. Peri-Wound Care: Zinc Oxide Ointment 30g tube 2 x Per Week/30 Days Discharge Instructions: Apply Zinc Oxide to periwound with each dressing change as needed. Topical: keystone antibiotic compound 2 x Per Week/30 Days Discharge Instructions: thin layer to wound bed Prim Dressing: KerraCel Ag Gelling Fiber Dressing, 4x5 in (silver alginate) (Generic) 2 x Per Week/30 Days ary Discharge Instructions: Apply silver alginate to wound bed as instructed Secondary Dressing: ABD Pad, 8x10 2 x Per Week/30 Days Discharge Instructions: Apply over primary dressing as directed. Secondary Dressing: Zetuvit Plus 4x8 in (Generic) 2 x Per Week/30 Days Discharge Instructions: Apply over primary dressing as directed. Compression Wrap: Kerlix Roll 4.5x3.1 (in/yd) 2 x Per Week/30 Days Discharge Instructions: Apply Kerlix and Coban compression as directed. Compression Wrap: Coban Self-Adherent Wrap 4x5 (in/yd) 2 x Per Week/30 Days Discharge Instructions: Apply over Kerlix as directed. Electronic Signature(s) Signed: 03/03/2021 4:53:46 PM By: [name] MD Signed: 03/03/2021 5:07:48 PM By: [name] RN, BSN Entered By: [name] on 03/03/2021 08:14:08 -------------------------------------------------------------------------------- Problem List Details Patient Name: Date of Service: [name], [name] 03/03/2021 7:30 A M Medical Record Number: [id number] Patient Account Number: [id number] Date of Birth/Sex: Treating RN: 06-05-1985 (35 y.o. [name] Primary Care Provider: [name] Other Clinician: Referring Provider: Treating Provider/Extender: [name] in Treatment: 51 Active Problems ICD-10 Encounter Code Description Active Date MDM Diagnosis E11.621 Type 2 diabetes mellitus with foot ulcer  10/24/2019 No Yes L97.528 Non-pressure chronic ulcer of other part of left foot with other specified 10/24/2019 No Yes severity Inactive Problems ICD-10 Code Description Active Date Inactive Date L97.518 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ity 10/24/2019 10/24/2019 L97.518 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ity 07/14/2020 07/14/2020 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, right ankle and foot 10/24/2019 10/24/2019 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 08/10/2020 08/10/2020 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, left ankle and foot 10/24/2019 10/24/2019 B95.62 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 10/24/2019 10/24/2019 classified elsewhere Resolved Problems Electronic Signature(s) Signed: 03/03/2021 4:53:46 PM By: [name] MD Entered By: [name] on 03/03/2021 08:18:05 -------------------------------------------------------------------------------- Progress Note Details Patient Name: Date of Service: [name], [name] 03/03/2021 7:30 A M Medical Record Number: [id number] Patient Account Number: [id number] Date of Birth/Sex: Treating RN: 03-22-1986 (35 y.o. 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 He comes in for review of a wound over the plantar fifth metatarsal head extending into the lateral part of the foot. He was followed for this previously by his podiatrist Dr. Cornelius Moras. As the patient tells his story he went to see podiatry first for a swelling he developed on the lateral part of his fifth metatarsal head in May. He states this was "open"  by podiatry and the area closed. He was followed up in June and it was again opened callus removed and it closed promptly. There were plans being made for surgery on the fifth metatarsal head in June however his blood sugar was apparently too high for anesthesia. Apparently the area was debrided and opened again in June and it is never closed since. Looking over the records from podiatry I am really not able to follow this. It was clear when he was first seen it was before 5/14 at that point he already had a wound. By 5/17 the ulcer was resolved. I do not see anything about a procedure. On 5/28 noted to have pre-ulcerative moderate keratosis. X-ray noted 1/5 contracted toe and tailor's bunion and metatarsal deformity. On a visit date on 09/28/2018 the dorsal part of the left foot it healed and resolved. There was concern about swelling in his lower extremity he was sent to the ER.. As far as I can tell he was seen in the ER on 7/12 with an ulcer on his left foot. A DVT rule out of the left leg was negative. I do not think I have complete records from podiatry but I am not able to verify the procedures this patient states he had. He states after the last procedure the wound has never closed although I am not able to follow this in the records I have from podiatry. He has not had a recent x-ray The patient has been using Neosporin on the wound. He is wearing a Darco shoe. He is still very active up on his foot working and exercising. Past medical history; type 2 diabetes ketosis-prone, leg swelling with a negative DVT study in July. Non-smoker ABI in our clinic was 0.85 on the left 10/16; substantial wound on the plantar left fifth met head extending laterally almost to the dorsal fifth MTP. We have been using silver alginate we gave him a Darco forefoot off loader. An x-ray did not show evidence of osteomyelitis did note soft tissue emphysema which I think was due to gas tracking through an open wound.  There is no doubt in my mind he requires an MRI 10/23; MRI not booked until 3 November at the earliest this is largely due to his glucose sensor in  the right arm. We have been using silver alginate. There has been an improvement 10/29; I am still not exactly sure when his MRI is booked for. He says it is the third but it is the 10th in epic. This definitely needs to be done. He is running a low-grade fever today but no other symptoms. No real improvement in the 1 02/26/2019 patient presents today for a follow-up visit here in our clinic he is last been seen in the clinic on October 29. Subsequently we were working on getting MRI to evaluate and see what exactly was going on and where we would need to go from the standpoint of whether or not he had osteomyelitis and again what treatments were going be required. Subsequently the patient ended up being admitted to the hospital on 02/07/2019 and was discharged on 02/14/2019. This is a somewhat interesting admission with a discharge diagnosis of pneumonia due to COVID-19 although he was positive for COVID-19 when tested at the urgent care but negative x2 when he was actually in the hospital. With that being said he did have acute respiratory failure with hypoxia and it was noted he also have a left foot ulceration with osteomyelitis. With that being said he did require oxygen for his pneumonia and I level 4 L. He was placed on antivirals and steroids for the COVID-19. He was also transferred to the [place] at one point. Nonetheless he did subsequently discharged home and since being home has done much better in that regard. The CT angiogram did not show any pulmonary embolism. With regard to the osteomyelitis the patient was placed on vancomycin and Zosyn while in the hospital but has been changed to Augmentin at discharge. It was also recommended that he follow- up with wound care and podiatry. Podiatry however wanted him to see [name] according to  the patient prior to them doing anything further. His hemoglobin A1c was 9.9 as noted in the hospital. Have an MRI of the left foot performed while in the hospital on 02/04/2019. 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 There is an overlying plantar open wound noted an abscess tracking back along the lateral aspect of the fifth metatarsal shaft. There is otherwise diffuse cellulitis and mild fasciitis without findings of polymyositis. The patient did have recently pneumonia secondary to COVID-19 I looked in the chart through epic and it does appear that the patient may need to have an additional x-ray just to ensure everything is cleared and that he has no airspace disease prior to putting him into the [name]. 03/05/2019; patient was readmitted to the clinic last week. He was hospitalized twice for a viral upper respiratory tract infection from 11/1 through 11/4 and then 11/5 through 11/12 ultimately this turned out to be Covid pneumonitis. Although he was discharged on oxygen he is not using it. He says he feels fine. He has no exercise limitation no cough no sputum. His O2 sat in our clinic today was 100% on room air. 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 He received Vanco and Zosyn in the hospital and then was discharged on 2 weeks of Augmentin. I do not see any relevant cultures. He was supposed to follow-up with infectious disease but I do not see that he has an appointment. 12/8; patient saw Dr.  Cromer of infectious disease last week. He felt that he had had adequate antibiotic therapy. He did not go to follow-up with Dr. Amalia Hailey of podiatry and I have again talked to him about the pros and cons of this. He does not want to consider a ray amputation of this time. He is aware of the  risks of recurrence, migration etc. He started HBO today and tolerated this well. He can complete the Augmentin that I gave him last week. I have looked over the lab work that Dr. Chana Bode ordered his C-reactive protein was 3.3 and his sedimentation rate was 17. The C-reactive protein is never really been measurably that high in this patient 12/15; not much change in the wound today however he has undermining along the lateral part of the foot again more extensively than last week. He has some rims of epithelialization. We have been using silver alginate. He is undergoing hyperbarics but did not dive today 12/18; in for his obligatory first total contact cast change. Unfortunately there was pus coming from the undermining area around his fifth metatarsal head. This was cultured but will preclude reapplication of a cast. He is seen in conjunction with HBO 12/24; patient had staph lugdunensis in the wound in the undermining area laterally last time. We put him on doxycycline which should have covered this. The wound looks better today. I am going to give him another week of doxycycline before reattempting the total contact cast 12/31; the patient is completing antibiotics. Hemorrhagic debris in the distal part of the wound with some undermining distally. He also had hyper granulation. Extensive debridement with a #5 curette. The infected area that was on the lateral part of the fifth met head is closed over. I do not think he needs any more antibiotics. Patient was seen prior to HBO. Preparations for a total contact cast were made in the cast will be placed post hyperbarics 04/11/19; once again the patient arrives today without complaint. He had been in a cast all week noted that he had heavy drainage this week. This resulted in large raised areas of macerated tissue around the wound 1/14; wound bed looks better slightly smaller. Hydrofera Blue has been changing himself. He had a heavy drainage last week  which caused a lot of maceration around the wound so I took him out of a total contact cast he says the drainage is actually better this week He is seen today in conjunction with HBO 1/21; returns to clinic. He was up in [place] for a day or 2 attending a funeral. He comes back in with the wound larger and with a large area of exposed bone. He had osteomyelitis and septic arthritis of the fifth left metatarsal head while he was in hospital. He received IV antibiotics in the hospital for a prolonged period of time then 3 weeks of Augmentin. Subsequently I gave him 2 weeks of doxycycline for more superficial wound infection. When I saw this last week the wound was smaller the surface of the wound looks satisfactory. 1/28; patient missed hyperbarics today. Bone biopsy I did last time showed Enterococcus faecalis and Staphylococcus lugdunensis . He has a wide area of exposed bone. We are going to use silver alginate as of today. I had another ethical discussion with the patient. 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 In this situation I think the likelihood of healing this is low. Therefore I have recommended a ray amputation and with the patient's agreement I  have referred him to Dr. Doran Durand. The other issue is that his compliance with hyperbarics has been minimal because of his work schedule and given his underlying decision I am going to stop this today READMISSION 10/24/2019 MRI 09/29/2019 left foot IMPRESSION: 1. Apparent skin ulceration inferior and lateral to the 5th metatarsal base with underlying heterogeneous T2 signal and enhancement in the subcutaneous fat. Small peripherally enhancing fluid collections along the plantar and lateral aspects of the 5th metatarsal base suspicious for abscesses. 2. Interval amputation through the mid 5th metatarsal with nonspecific low-level marrow edema and enhancement. Given the proximity to the adjacent soft tissue  inflammatory changes, osteomyelitis cannot be excluded. 3. The additional bones appear unremarkable. MRI 09/29/2019 right foot IMPRESSION: 1. Soft tissue ulceration lateral to the 5th MTP joint. There is low-level T2 hyperintensity within the 4th and 5th metatarsal heads and adjacent proximal phalanges without abnormal T1 signal or cortical destruction. These findings are nonspecific and could be seen with early marrow edema, hyperemia or early osteomyelitis. No evidence of septic joint. 2. Mild tenosynovitis and synovial enhancement associated with the extensor digitorum tendons at the level of the midfoot. 3. Diffuse low-level muscular T2 hyperintensity and enhancement, most consistent with diabetic myopathy. LEFT FOOT BONE Methicillin resistant staphylococcus aureus Staphylococcus lugdunensis MIC MIC CIPROFLOXACIN >=8 RESISTANT Resistant <=0.5 SENSI... Sensitive CLINDAMYCIN <=0.25 SENS... Sensitive >=8 RESISTANT Resistant ERYTHROMYCIN >=8 RESISTANT Resistant >=8 RESISTANT Resistant GENTAMICIN <=0.5 SENSI... Sensitive <=0.5 SENSI... Sensitive Inducible Clindamycin NEGATIVE Sensitive NEGATIVE Sensitive OXACILLIN >=4 RESISTANT Resistant 2 SENSITIVE Sensitive RIFAMPIN <=0.5 SENSI... Sensitive <=0.5 SENSI... Sensitive TETRACYCLINE <=1 SENSITIVE Sensitive <=1 SENSITIVE Sensitive TRIMETH/SULFA <=10 SENSIT Sensitive <=10 SENSIT Sensitive ... Marland Kitchen.. VANCOMYCIN 1 SENSITIVE Sensitive <=0.5 SENSI... Sensitive Right foot bone . Component 3 wk ago Specimen Description BONE Special Requests RIGHT 4 METATARSAL SAMPLE B Gram Stain NO WBC SEEN NO ORGANISMS SEEN Culture RARE METHICILLIN RESISTANT STAPHYLOCOCCUS AUREUS NO ANAEROBES ISOLATED Performed at [facility], [place] [address], [place], [place] [address] Report Status 10/08/2019 FINAL Organism ID, Bacteria METHICILLIN RESISTANT STAPHYLOCOCCUS AUREUS Resulting Agency [facility] Susceptibility Methicillin resistant staphylococcus  aureus MIC CIPROFLOXACIN >=8 RESISTANT Resistant CLINDAMYCIN <=0.25 SENS... Sensitive ERYTHROMYCIN >=8 RESISTANT Resistant GENTAMICIN <=0.5 SENSI... Sensitive Inducible Clindamycin NEGATIVE Sensitive OXACILLIN >=4 RESISTANT Resistant RIFAMPIN <=0.5 SENSI... Sensitive TETRACYCLINE <=1 SENSITIVE Sensitive TRIMETH/SULFA <=10 SENSIT Sensitive ... VANCOMYCIN 1 SENSITIVE Sensitive This is a patient we had in clinic earlier this year with a wound over his left fifth metatarsal head. 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 Doran Durand on 05/16/2019. He seems to have developed problems with wounds on his bilateral feet in June 2021 although he may have had problems earlier than this. He was in an urgent care with a right foot ulcer on 09/26/2019 and given a course of doxycycline. This was apparently after having trouble getting into see orthopedics. He was seen by podiatry on 09/28/2019 noted to have bilateral lower extremity ulcers including the left lateral fifth metatarsal base and the right subfifth met head. It was noted that had purulent drainage at that time. He required hospitalization from 6/20 through 7/2. This was because of worsening right foot wounds. He underwent bilateral operative incision and drainage and bone biopsies bilaterally. Culture results are listed above. He  has been referred back to clinic by Dr. Jacqualyn Posey of podiatry. He is also followed by Dr. Megan Salon who saw him yesterday. He was discharged from hospital on Zyvox Flagyl and Levaquin and yesterday changed to doxycycline Flagyl and Levaquin. His inflammatory markers on 6/26 showed a sedimentation rate of 129 and a C-reactive protein of 5. This is  improved to 14 and 1.3 respectively. This would indicate improvement. ABIs in our clinic today were 1.23 on the right and 1.20 on the left 11/01/2019 on evaluation today patient appears to be doing fairly well in regard to the wounds on his feet at this point. Fortunately there is no signs of active infection at this time. No fevers, chills, nausea, vomiting, or diarrhea. He currently is seeing infectious disease and still under their care at this point. Subsequently he also has both wounds which she has not been using collagen on as he did not receive that in his packaging he did not call us and let us know that. Apparently that just was missed on the order. Nonetheless we will get that straightened out today. 8/9-Patient returns for bilateral foot wounds, using Prisma with hydrogel moistened dressings, and the wounds appear stable. Patient using surgical shoes, avoiding much pressure or weightbearing as much as possible 8/16; patient has bilateral foot wounds. 1 on the right lateral foot proximally the other is on the left mid lateral foot. Both required debridement of callus and thick skin around the wounds. We have been using silver collagen 8/27; patient has bilateral lateral foot wounds. The area on the left substantially surrounded by callus and dry skin. This was removed from the wound edge. The underlying wound is small. The area on the right measured somewhat smaller today. 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 Unfortunately I did not update his antibiotics during today's visit. 9/10 I reviewed Dr. Hale Bogus last notes he felt he had completed antibiotics his inflammatory markers were reasonably well controlled. He has a small wound on the lateral left foot and a tiny area on the right which is just above closed. He is using Hydrofera Blue with border foam he has bilateral surgical shoes 9/24; 2 week f/u. doing well.  right foot is closed. left foot still undermined. 10/14; right foot remains closed at the fifth met head. The area over the base of the left fifth metatarsal has a small open area but considerable undermining towards the plantar foot. Thick callus skin around this suggests an adequate pressure relief. We have talked about this. He says he is going to go back into his cam boot. I suggested a total contact cast he did not seem enamored with this suggestion 10/26; left foot base of the fifth metatarsal. Same condition as last time. He has skin over the area with an open wound however the skin is not adherent. He went to see Dr. Earleen Newport who did an x-ray and culture of his foot I have not reviewed the x-ray but the patient was not told anything. He is on doxycycline 11/11; since the patient was last here he was in the emergency room on 10/30 he was concerned about swelling in the left foot. They did not do any cultures or x-rays. They changed his antibiotics to cephalexin. Previous culture showed group B strep. The cephalexin is appropriate as doxycycline has less than predictable coverage. Arrives in clinic today with swelling over this area under the wound. He also has a new  wound on the right fifth metatarsal head 11/18; the patient has a difficult wound on the lateral aspect of the left fifth metatarsal head. The wound was almost ballotable last week I opened it slightly expecting to see purulence however there was just bleeding. I cultured this this was negative. X-ray unchanged. We are trying to get an MRI but I am not sure were going to be able to get this through his insurance. He also has an area on the right lateral fifth metatarsal head this looks healthier 12/3; the patient finally got our MRI. Surprisingly this did not show osteomyelitis. I did show the soft tissue ulceration at the lateral plantar aspect of the fifth metatarsal base with a tiny residual 6 mm abscess overlying the superficial  fascia I have tried to culture this area I have not been able to get this to grow anything. Nevertheless the protruding tissue looks aggravated. I suspect we should try to treat the underlying "abscess with broad-spectrum antibiotics. I am going to start him on Levaquin and Flagyl. He has much less edema in his legs and I am going to continue to wrap his legs and see him weekly 12/10. I started Levaquin and Flagyl on him last week. He just picked up the Flagyl apparently there was some delay. The worry is the wound on the left fifth metatarsal base which is substantial and worsening. His foot looks like he inverts at the ankle making this a weightbearing surface. Certainly no improvement in fact I think the measurements of this are somewhat worse. We have been using 12/17; he apparently just got the Levaquin yesterday this is 2 weeks after the fact. He has completed the Flagyl. The area over the left fifth metatarsal base still has protruding granulation tissue although it does not look quite as bad as it did some weeks ago. He has severe bilateral lymphedema although we have not been treating him for wounds on his legs this is definitely going to require compression. There was so much edema in the left I did not wish to put him in a total contact cast today. I am going to increase his compression from 3-4 layer. The area on the right lateral fifth met head actually look quite good and superficial. 12/23; patient arrived with callus on the right fifth met head and the substantial hyper granulated callused wound on the base of his fifth metatarsal. He says he is completing his Levaquin in 2 days but I do not think that adds up with what I gave him but I will have to double check this. We are using Hydrofera Blue on both areas. My plan is to put the left leg in a cast the week after New Year's 04/06/2020; patient's wounds about the same. Right lateral fifth metatarsal head and left lateral foot over the  base of the fifth metatarsal. There is undermining on the left lateral foot which I removed before application of total contact cast continuing with Hydrofera Blue new. Patient tells me he was seen by endocrinology today lab work was done [Dr. Kerr]. Also wondering whether he was referred to cardiology. I went over some lab work from previously does not have chronic renal failure certainly not nephrotic range proteinuria he does have very poorly controlled diabetes but this is not his most updated lab work. Hemoglobin A1c has been over 11 1/10; the patient had a considerable amount of leakage towards mid part of his left foot with macerated skin however the wound surface looks  better the area on the right lateral fifth met head is better as well. I am going to change the dressing on the left foot under the total contact cast to silver alginate, continue with Hydrofera Blue on the right. 1/20; patient was in the total contact cast for 10 days. Considerable amount of drainage although the skin around the wound does not look too bad on the left foot. The area on the right fifth metatarsal head is closed. Our nursing staff reports large amount of drainage out of the left lateral foot wound 1/25; continues with copious amounts of drainage described by our intake staff. PCR culture I did last week showed E. coli and Enterococcus faecalis and low quantities. Multiple resistance genes documented including extended spectrum beta lactamase, MRSA, MRSE, quinolone, tetracycline. The wound is not quite as good this week as it was 5 days ago but about the same size 2/3; continues with copious amounts of malodorous drainage per our intake nurse. The PCR culture I did 2 weeks ago showed E. coli and low quantities of Enterococcus. There were multiple resistance genes detected. I put Neosporin on him last week although this does not seem to have helped. The wound is slightly deeper today. Offloading continues to be an  issue here although with the amount of drainage she has a total contact cast is just not going to work 2/10; moderate amount of drainage. Patient reports he cannot get his stocking on over the dressing. I told him we have to do that the nurse gave him suggestions on how to make this work. The wound is on the bottom and lateral part of his left foot. Is cultured predominantly grew low amounts of Enterococcus, E. coli and anaerobes. There were multiple resistance genes detected including extended spectrum beta lactamase, quinolone, tetracycline. I could not think of an easy oral combination to address this so for now I am going to do topical antibiotics provided by [facility] I think the main agents here are vancomycin and an aminoglycoside. We have to be able to give him access to the wounds to get the topical antibiotic on 2/17; moderate amount of drainage this is unchanged. He has his Keystone topical antibiotic against the deep tissue culture organisms. He has been using this and changing the dressing daily. Silver alginate on the wound surface. 2/24; using Keystone antibiotic with silver alginate on the top. He had too much drainage for a total contact cast at one point although I think that is improving and I think in the next week or 2 it might be possible to replace a total contact cast I did not do this today. In general the wound surface looks healthy however he continues to have thick rims of skin and subcutaneous tissue around the wide area of the circumference which I debrided 06/04/2020 upon evaluation today patient appears to be doing well in regard to his wound. I do feel like he is showing signs of improvement. There is little bit of callus and dead tissue around the edges of the wound as well as what appears to be a little bit of a sinus tract that is off to the side laterally I would perform debridement to clear that away today. 3/17; left lateral foot. The wound looks about the same as I  remember. Not much depth surface looks healthy. No evidence of infection 3/25; left lateral foot. Wound surface looks about the same. Separating epithelium from the circumference. There really is no evidence of infection here however  not making progress by my view 3/29; left lateral foot. Surface of the wound again looks reasonably healthy still thick skin and subcutaneous tissue around the wound margins. There is no evidence of infection. One of the concerns being brought up by the nurses has again the amount of drainage vis--vis continued use of a total contact cast 4/5; left lateral foot at roughly the base of the fifth metatarsal. Nice healthy looking granulated tissue with rims of epithelialization. The overall wound measurements are not any better but the tissue looks healthy. The only concern is the amount of drainage although he has no surrounding maceration with what we have been doing recently to absorb fluid and protect his skin. He also has lymphedema. 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 He states that the kerlix/coban under the TCC rolled down and could not get it back up. He has been using an offloading scooter and has somehow hurt his right foot using this device. This happened last week. He states that the side of his right foot developed a blister and opened. The top of his foot also has a few small open wounds he thinks is due to his socks rubbing in his shoes. He has not been using any dressings to the wound. He denies purulent drainage, fever/chills or erythema to the wounds. 4/22; patient presents for 1 week follow-up. He developed new wounds to the right foot that were evaluated at last clinic visit. He continues to have a  total contact cast to the left leg and he reports no issues. He has been using silver collagen to the right foot wounds with no issues. He denies purulent drainage, fever/chills or erythema to the right foot wounds. He has no complaints today 4/25; patient presents for 1 week follow-up. He has a total contact cast of the left leg and reports no issues. He has been using silver alginate to the right foot wound. He denies purulent drainage, fever/chills or erythema to the right foot wounds. 5/2 patient presents for 1 week follow-up. T contact cast on the left. The wound which is on the base of the plantar foot at the base of the fifth metatarsal otal actually looks quite good and dimensions continue to gradually contract. HOWEVER the area on the right lateral fifth metatarsal head is much larger than what I remember from 2 weeks ago. Once more is he has significant levels of hypergranulation. Noteworthy that he had this same hyper granulated response on his wound on the left foot at one point in time. So much so that he I thought there was an underlying fluid collection. Based on this I think this just needs debridement. 5/9; the wound on the left actually continues to be gradually smaller with a healthy surface. Slight amount of drainage and maceration of the skin around but not too bad. However he has a large wound over the right fifth metatarsal head very much in the same configuration as his left foot wound was initially. I used silver nitrate to address the hyper granulated tissue no mechanical debridement 5/16; area on the left foot did not look as healthy this week deeper thick surrounding macerated skin and subcutaneous tissue. oo The area on the right foot fifth met  head was about the same oo The area on the right ankle that we identified last week is completely broken down into an open wound presumably a stocking rubbing issue 5/23; patient has been using a total contact cast to the left  side. He has been using silver alginate underneath. He has also been using silver alginate to the right foot wounds. He has no complaints today. He denies any signs of infection. 5/31; the left-sided wound looks some better measure smaller surface granulation looks better. We have been using silver alginate under the total contact cast oo The large area on his right fifth met head and right dorsal foot look about the same still using silver alginate 6/6; neither side is good as I was hoping although the surface area dimensions are better. A lot of maceration on his left and right foot around the wound edge. Area on the dorsal right foot looks better. He says he was traveling. I am not sure what does the amount of maceration around the plantar wounds may be drainage issues 6/13; in general the wound surfaces look quite good on both sides. Macerated skin and raised edges around the wound required debridement although in general especially on the left the surface area seems improved. oo The area on the right dorsal ankle is about the same I thought this would not be such a problem to close 6/20; not much change in either wound although the one on the right looks a little better. Both wounds have thick macerated edges to the skin requiring debridements. We have been using silver alginate. The area on the dorsal right ankle is still open I thought this would be closed. 6/28; patient comes in today with a marked deterioration in the right foot wound fifth met head. Wide area of exposed bone this is a drastic change from last time. The area on the left there we have been casting is stagnant. We have been using silver alginate in both wound areas. 7/5; bone culture I did for PCR last time was positive for Pseudomonas, group B strep, Enterococcus and Staph aureus. There was no suggestion of methicillin resistance or ampicillin resistant genes. This was resistant to tetracycline however He comes into the  clinic today with the area over his right plantar fifth metatarsal head which had been doing so well 2 weeks ago completely necrotic feeling bone. I do not know that this is going to be salvageable. The left foot wound is certainly no smaller but it has a better surface and is superficial. 7/8; patient called in this morning to say that his total contact cast was rubbing against his foot. He states he is doing fine overall. He denies signs of infection. 7/12; continued deterioration in the wound over the right fifth metatarsal head crumbling bone. This is not going to be salvageable. The patient agrees and wants to be referred to Dr. Doran Durand which we will attempt to arrange as soon as possible. I am going to continue him on antibiotics as long as that takes so I will renew those today. The area on the left foot which is the base of the fifth metatarsal continues to look somewhat better. Healthy looking tissue no depth no debridement is necessary here. 7/20; the patient was kindly seen by Dr. Doran Durand of orthopedics on 10/19/2020. 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 Towards this end we  have taken him out of the total contact cast on the left we will put him in a wrap with Hydrofera Blue. As I understand things surgery is planned for 7/21 7/27; patient had his surgery last Thursday. 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 Apparently everything went well we did not still disturb that today The area on the left foot actually looks quite good. He has been much less mobile which probably explains this he did not seem to do well in the total contact cast secondary to drainage and maceration I think. 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 Fortunately there is no evidence of active infection at this  time. No fevers, chills, nausea, vomiting, or diarrhea. Overall I think that he is actually doing extremely well. Nonetheless I do believe that he is staying off of this more following the surgery in his right foot that is the reason the left is doing so great. 8/16; left plantar foot wound. This looks smaller than the last time I saw this he is using Hydrofera Blue. The surgical wound on the right foot is being followed by Dr. Doran Durand we did not look at this today. He has surgical shoes on both feet 8/23; left plantar foot wound not as good this week. Surrounding macerated skin and subcutaneous tissue everything looks moist and wet. I do not think he is offloading this adequately. He is using a surgical shoe Apparently the right foot surgical wound is not open although I did not check his foot 8/31; left plantar foot lateral aspect. Much improved this week. He has no maceration. Some improvement in the surface area of the wound but most impressively the depth is come in we are using silver alginate. The patient is a Product/process development scientist. He is asked that we write him a letter so he can go back to work. I have also tried to see if we can write something that will allow him to limit the amount of time that he is on his foot at work. 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 Hopefully the latter can be avoided 9/6; I believe the patient missed an appointment last week. He arrives in today with a wound looking roughly the same certainly no better. Undermining laterally and also inferiorly. We used molecuLight today in training with the patient's permission.. We are using silver alginate 9/21 wound is measuring bigger this week although this may have to do with the aggressive circumferential debridement last week in response to the blush fluorescence on the MolecuLight. Culture I did last week showed significant MSSA and E.  coli. I put him on Augmentin but he has not started it yet. We are also going to send this for compounded antibiotics at [facility]. There is no evidence of systemic infection 9/29; silver alginate. His Keystone arrived. He is completing Augmentin in 2 days. Offloading in a cam boot. Moderate drainage per our intake staff 10/5; using silver alginate. He has been using his [place]. He has completed his Augmentin. Per our intake nurse still a lot of drainage, far too much to consider a total contact cast. Wound measures about the same. He had the same undermining area that I defined last week from a roughly 11-3. I remove this today 10/12; using silver alginate he is using  the [place]. He comes in for a nurse visit hence we are applying [employer] twice a week. Measuring slightly better today and less notable drainage. Extensive debridement of the wound edge last time 10/18; using topical Keystone and silver alginate and a soft cast. Wound measurements about the same. Drainage was through his soft cast. We are changing this twice a week Tuesdays and Friday 10/25; comes in with moderate drainage. Still using Keystone silver alginate and a soft cast. Wound dimensions completely the same.He has a lot of edema in the left leg he has lymphedema. Asking for [name] to consider wrapping him as he cannot get his stocking on over the soft cast 11/2; comes in with moderate to large drainage slightly smaller in terms of width we have been using [place]. His wound looks satisfactory but not much improvement 11/4; patient presents today for obligatory cast change. Has no issues or complaints today. He denies signs of infection. 11/9; patient traveled this weekend to [place], was on the cast quite a bit. Staining of the cast with black material from his walking boot. Drainage was not quite as bad as we feared. Using silver alginate and Keystone 11/16; we do not have size for cast therefore we have been putting a soft cast on  him since the change on Friday. Still a significant amount of drainage necessitating changing twice a week. We have been using the Keystone at cast changes either hard or soft as well as silver alginate Comes in the clinic with things actually looking fairly good improvement in width. He says his offloading is about the same 02/24/2021 upon evaluation today patient actually comes back in and is doing excellent in regard to his foot ulcer this is significantly smaller even compared to the last visit. The soft cast seems to have done extremely well for him which is great news. I do not see any signs of infection minimal debridement will be needed today. 11/30; left lateral foot much improved half a centimeter improvement in surface area. No evidence of infection. He seems to be doing better with the soft cast in the TCC therefore we will continue with this. He comes back in later in the week for a change with the nurses. This is due to drainage Objective Constitutional Sitting or standing Blood Pressure is within target range for patient.. Pulse regular and within target range for patient.Marland Kitchen Respirations regular, non-labored and within target range.. Temperature is normal and within the target range for the patient.Marland Kitchen Appears in no distress. Vitals Time Taken: 7:47 AM, Height: 77 in, Weight: 280 lbs, BMI: 33.2, Temperature: 98.3 F, Pulse: 97 bpm, Respiratory Rate: 18 breaths/min, Blood Pressure: 145/78 mmHg, Capillary Blood Glucose: 82 mg/dl. General Notes: glucose per pt report General Notes: Wound exam; a nice improvement since last time I saw this 2 weeks ago. No debridement is required measurements are smaller. Granulation on the wound surface looks healthy on even under illumination. I see no evidence of infection. No surrounding erythema. No purulent drainage Integumentary (Hair, Skin) Wound #3 status is Open. Original cause of wound was Trauma. The date acquired was: 10/02/2019. The wound has  been in treatment 70 weeks. The wound is located on the Left,Lateral Foot. The wound measures 2.2cm length x 1.8cm width x 0.5cm depth; 3.11cm^2 area and 1.555cm^3 volume. There is Fat Layer (Subcutaneous Tissue) exposed. There is no tunneling or undermining noted. There is a large amount of serosanguineous drainage noted. The wound margin is epibole. There is large (67-100%) red, pink  granulation within the wound bed. There is a small (1-33%) amount of necrotic tissue within the wound bed including Adherent Slough. 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 An Soft Cast procedure was performed by [name], RN. Post procedure Diagnosis Wound #3: Same as Pre-Procedure Plan Follow-up Appointments: Return Appointment in 1 week. Nurse Visit: - Friday to change soft cast Bathing/ Shower/ Hygiene: May shower with protection but do not get wound dressing(s) wet. Edema Control - Lymphedema / SCD / Other: Elevate legs to the level of the heart or above for 30 minutes daily and/or when sitting, a frequency of: - throughout the day Avoid standing for long periods of time. Exercise regularly Moisturize legs daily. - right leg every night before bed. Compression stocking or Garment 20-30 mm/Hg pressure to: - Apply to right leg in the morning and remove at night. Off-Loading: Other: - minimal weight bearing left foot Other: - Soft cast to left foot - wrap leg with kerlix/coban to reduce swelling Additional Orders / Instructions: Follow Nutritious Diet WOUND #3: - Foot Wound Laterality: Left, Lateral Cleanser: Soap and Water 2 x Per Week/30 Days Discharge Instructions: May shower and wash wound with dial antibacterial soap and water prior  to dressing change. Cleanser: Wound Cleanser (Generic) 2 x Per Week/30 Days Discharge Instructions: Cleanse the wound with wound cleanser prior to applying a clean dressing using gauze sponges, not tissue or cotton balls. Peri-Wound Care: Zinc Oxide Ointment 30g tube 2 x Per Week/30 Days Discharge Instructions: Apply Zinc Oxide to periwound with each dressing change as needed. Topical: keystone antibiotic compound 2 x Per Week/30 Days Discharge Instructions: thin layer to wound bed Prim Dressing: KerraCel Ag Gelling Fiber Dressing, 4x5 in (silver alginate) (Generic) 2 x Per Week/30 Days ary Discharge Instructions: Apply silver alginate to wound bed as instructed Secondary Dressing: ABD Pad, 8x10 2 x Per Week/30 Days Discharge Instructions: Apply over primary dressing as directed. Secondary Dressing: Zetuvit Plus 4x8 in (Generic) 2 x Per Week/30 Days Discharge Instructions: Apply over primary dressing as directed. Com pression Wrap: Kerlix Roll 4.5x3.1 (in/yd) 2 x Per Week/30 Days Discharge Instructions: Apply Kerlix and Coban compression as directed. Com pression Wrap: Coban Self-Adherent Wrap 4x5 (in/yd) 2 x Per Week/30 Days Discharge Instructions: Apply over Kerlix as directed. 1. We continue with silver alginate, soft cast, kerlix Coban to reduce swelling 2. At this point he seems to be doing better with a soft cast after discussion with our nursing staff. There seems little reason to move to a contact cast 3. No evidence of infection and as long as this is improving in surface area no need for debridement Electronic Signature(s) Signed: 03/03/2021 4:53:46 PM By: [name] MD Entered By: [name] on 03/03/2021 03:21:22 -------------------------------------------------------------------------------- SuperBill Details Patient Name: Date of Service: [name], [name] 03/03/2021 Medical Record Number: [id number] Patient Account Number: [id number] Date of Birth/Sex: Treating  RN: July 10, 1985 (35 y.o. M) Primary Care Provider: [name] Other Clinician: Referring Provider: Treating Provider/Extender: [name] in Treatment: 70 Diagnosis Coding ICD-10 Codes Code Description E11.621 Type 2 diabetes mellitus with foot ulcer L97.528 Non-pressure chronic ulcer of other part of left foot with other specified severity Facility Procedures CPT4 Code: [id number] Description: 45038 Application of clubfoot cast with  molding or manipulation, long or short leg Modifier: Quantity: 1 Physician Procedures : CPT4 Code Description Modifier [id number] - WC PHYS LEVEL 3 - EST PT ICD-10 Diagnosis Description E11.621 Type 2 diabetes mellitus with foot ulcer L97.528 Non-pressure chronic ulcer of other part of left foot with other specified severity Quantity: 1 Electronic Signature(s) Signed: 03/03/2021 4:53:46 PM By: [name] MD Entered By: [name] on 03/03/2021 08:23:42

## 2021-03-03 NOTE — Progress Notes (Signed)
[name] ([id number]) Visit Report for 03/03/2021 Arrival Information Details Patient Name: Date of Service: [name] D 03/03/2021 7:30 A M Medical Record Number: [id number] Patient Account Number: [id number] Date of Birth/Sex: Treating RN: 06/26/1985 (35 y.o. [name] Primary Care Faust Thorington: [name] Other Clinician: Referring Denisia Harpole: Treating [name]/Extender: [name] in Treatment: 68 Visit Information History Since Last Visit Added or deleted any medications: No Patient Arrived: Ambulatory Any new allergies or adverse reactions: No Arrival Time: 07:47 Had a fall or experienced change in No Accompanied By: alone activities of daily living that may affect Transfer Assistance: None risk of falls: Patient Identification Verified: Yes Signs or symptoms of abuse/neglect since last visito No Secondary Verification Process Completed: Yes Hospitalized since last visit: No Patient Requires Transmission-Based Precautions: No Implantable device outside of the clinic excluding No Patient Has Alerts: No cellular tissue based products placed in the center since last visit: Has Dressing in Place as Prescribed: Yes Has Compression in Place as Prescribed: Yes Pain Present Now: No Electronic Signature(s) Signed: 03/03/2021 5:07:48 PM By: [name] RN, BSN Entered By: [name] on 03/03/2021 07:47:52 -------------------------------------------------------------------------------- Lower Extremity Assessment Details Patient Name: Date of Service: [name], [name] 03/03/2021 7:30 A M Medical Record Number: [id number] Patient Account Number: [id number] Date of Birth/Sex: Treating RN: May 11, 1985 (35 y.o. [name] Primary Care [name]: [name] Other Clinician: Referring [name]: Treating Jazlen Ogarro/Extender: [name] in Treatment: 70 Edema Assessment Assessed: [name]: No] [name]:  No] Edema: [Left: Ye] [Right: s] Calf Left: Right: Point of Measurement: 48 cm From Medial Instep 48.6 cm Ankle Left: Right: Point of Measurement: 11 cm From Medial Instep 29.2 cm Vascular Assessment Pulses: Dorsalis Pedis Palpable: [Left:Yes] Electronic Signature(s) Signed: 03/03/2021 5:07:48 PM By: [name] RN, BSN Entered By: [name] on 03/03/2021 07:56:56 -------------------------------------------------------------------------------- Multi Wound Chart Details Patient Name: Date of Service: [name], [name] 03/03/2021 7:30 A M Medical Record Number: [id number] Patient Account Number: [id number] Date of Birth/Sex: Treating RN: [birth year] (35 y.o. M) Primary Care Rick Warnick: [name] Other Clinician: Referring Darice Vicario: Treating [name]/Extender: [name] in Treatment: 70 Vital Signs Height(in): 77 Capillary Blood Glucose(mg/dl): 82 Weight(lbs): 280 Pulse(bpm): 97 Body Mass Index(BMI): 33 Blood Pressure(mmHg): 145/78 Temperature(F): 98.3 Respiratory Rate(breaths/min): 18 Photos: [N/A:N/A] Left, Lateral Foot N/A N/A Wound Location: Trauma N/A N/A Wounding Event: Diabetic Wound/Ulcer of the Lower N/A N/A Primary Etiology: Extremity Type II Diabetes N/A N/A Comorbid History: 10/02/2019 N/A N/A Date Acquired: 29 N/A N/A Weeks of Treatment: Open N/A N/A Wound Status: 2.2x1.8x0.5 N/A N/A Measurements L x W x D (cm) 3.11 N/A N/A A (cm) : rea 1.555 N/A N/A Volume (cm) : -88.60% N/A N/A % Reduction in A rea: -842.40% N/A N/A % Reduction in Volume: Grade 2 N/A N/A Classification: Large N/A N/A Exudate A mount: Serosanguineous N/A N/A Exudate Type: red, brown N/A N/A Exudate Color: Epibole N/A N/A Wound Margin: Large (67-100%) N/A N/A Granulation A mount: Red, Pink N/A N/A Granulation Quality: Small (1-33%) N/A N/A Necrotic A mount: Fat Layer (Subcutaneous Tissue): Yes N/A N/A Exposed  Structures: Fascia: No Tendon: No Muscle: No Joint: No Bone: No Small (1-33%) N/A N/A Epithelialization: Soft Cast N/A N/A Procedures Performed: Treatment Notes Electronic Signature(s) Signed: 03/03/2021 4:53:46 PM By: [name] MD Entered By: [name] on 03/03/2021 08:18:14 -------------------------------------------------------------------------------- Multi-Disciplinary Care Plan Details Patient Name: Date of Service: [name], [name] 03/03/2021 7:30 A M Medical  Record Number: [id number] Patient Account Number: [id number] Date of Birth/Sex: Treating RN: [birth year] (35 y.o. [name] Primary Care Lennyn Gange: [name] Other Clinician: Referring Maghen Group: Treating Kenley Troop/Extender: [name] in Treatment: 41 Multidisciplinary Care Plan reviewed with physician Active Inactive Nutrition Nursing Diagnoses: Imbalanced nutrition Potential for alteratiion in Nutrition/Potential for imbalanced nutrition Goals: Patient/caregiver agrees to and verbalizes understanding of need to use nutritional supplements and/or vitamins as prescribed Date Initiated: 10/24/2019 Date Inactivated: 04/06/2020 Target Resolution Date: 04/03/2020 Goal Status: Met Patient/caregiver will maintain therapeutic glucose control Date Initiated: 10/24/2019 Target Resolution Date: 04/02/2021 Goal Status: Active Interventions: Assess HgA1c results as ordered upon admission and as needed Assess patient nutrition upon admission and as needed per policy Provide education on elevated blood sugars and impact on wound healing Provide education on nutrition Treatment Activities: Education provided on Nutrition : 12/02/2020 Notes: 11/17/20: Glucose control ongoing issue, target date extended. 01/26/21: Glucose management continues. Wound/Skin Impairment Nursing Diagnoses: Impaired tissue integrity Knowledge deficit related to ulceration/compromised skin  integrity Goals: Patient/caregiver will verbalize understanding of skin care regimen Date Initiated: 10/24/2019 Target Resolution Date: 04/02/2021 Goal Status: Active Ulcer/skin breakdown will have a volume reduction of 30% by week 4 Date Initiated: 10/24/2019 Date Inactivated: 01/16/2020 Target Resolution Date: 01/10/2020 Unmet Reason: no change in Goal Status: Unmet measurements. Interventions: Assess patient/caregiver ability to obtain necessary supplies Assess patient/caregiver ability to perform ulcer/skin care regimen upon admission and as needed Assess ulceration(s) every visit Provide education on ulcer and skin care Notes: 11/17/20: Wound care regimen continues Electronic Signature(s) Signed: 03/03/2021 5:07:48 PM By: [name] RN, BSN Entered By: [name] on 03/03/2021 07:52:45 -------------------------------------------------------------------------------- Pain Assessment Details Patient Name: Date of Service: [name], [name] 03/03/2021 7:30 A M Medical Record Number: [id number] Patient Account Number: [id number] Date of Birth/Sex: Treating RN: 02-04-1986 (35 y.o. [name] Primary Care Adair Lemar: [name] Other Clinician: Referring Sheriff Rodenberg: Treating [place] Creswell/Extender: [name] in Treatment: 46 Active Problems Location of Pain Severity and Description of Pain Patient Has Paino No Site Locations Pain Management and Medication Current Pain Management: Electronic Signature(s) Signed: 03/03/2021 5:07:48 PM By: [name] RN, BSN Entered By: [name] on 03/03/2021 07:48:20 -------------------------------------------------------------------------------- Patient/Caregiver Education Details Patient Name: Date of Service: [name], [name] 11/30/2022andnbsp7:30 A M Medical Record Number: [id number] Patient Account Number: [id number] Date of Birth/Gender: Treating RN: 1985-09-20 (35 y.o. [name] Primary Care Physician: [name] Other Clinician: Referring Physician: Treating Physician/Extender: [name] in Treatment: 21 Education Assessment Education Provided To: Patient Education Topics Provided Wound/Skin Impairment: Methods: Explain/Verbal Responses: State content correctly [employer]) Signed: 03/03/2021 5:07:48 PM By: [name] RN, BSN Entered By: [name] on 03/03/2021 07:53:28 -------------------------------------------------------------------------------- Wound Assessment Details Patient Name: Date of Service: [name], [name] 03/03/2021 7:30 A M Medical Record Number: [id number] Patient Account Number: [id number] Date of Birth/Sex: Treating RN: [birth year] (35 y.o. [name] Primary Care Aviana Shevlin: [name] Other Clinician: Referring Lindbergh Winkles: Treating Janne Faulk/Extender: [name] in Treatment: 70 Wound Status Wound Number: 3 Primary Etiology: Diabetic Wound/Ulcer of the Lower Extremity Wound Location: Left, Lateral Foot Wound Status: Open Wounding Event: Trauma Comorbid History: Type II Diabetes Date Acquired: 10/02/2019 Weeks Of Treatment: 70 Clustered Wound: No Photos Wound Measurements Length: (cm) 2.2 Width: (cm) 1.8 Depth: (cm) 0.5 Area: (cm) 3.11 Volume: (cm) 1.555 % Reduction in Area: -88.6% % Reduction in Volume: -842.4% Epithelialization: Small (1-33%) Tunneling: No Undermining: No  Wound Description Classification: Grade 2 Wound Margin: Epibole Exudate Amount: Large Exudate Type: Serosanguineous Exudate Color: red, brown Foul Odor After Cleansing: No Slough/Fibrino Yes Wound Bed Granulation Amount: Large (67-100%) Exposed Structure Granulation Quality: Red, Pink Fascia Exposed: No Necrotic Amount: Small (1-33%) Fat Layer (Subcutaneous Tissue) Exposed: Yes Necrotic Quality: Adherent Slough Tendon Exposed: No Muscle  Exposed: No Joint Exposed: No Bone Exposed: No Electronic Signature(s) Signed: 03/03/2021 5:07:48 PM By: [name] RN, BSN Signed: 03/03/2021 5:07:48 PM By: [name] RN, BSN Entered By: [name] on 03/03/2021 07:56:24 -------------------------------------------------------------------------------- Vitals Details Patient Name: Date of Service: [name], [name] 03/03/2021 7:30 A M Medical Record Number: [id number] Patient Account Number: [id number] Date of Birth/Sex: Treating RN: [birth year] (35 y.o. [name] Primary Care Herschell Virani: [name] Other Clinician: Referring Cederick Broadnax: Treating Darrin Koman/Extender: [name] in Treatment: 26 Vital Signs Time Taken: 07:47 Temperature (F): 98.3 Height (in): 77 Pulse (bpm): 97 Weight (lbs): 280 Respiratory Rate (breaths/min): 18 Body Mass Index (BMI): 33.2 Blood Pressure (mmHg): 145/78 Capillary Blood Glucose (mg/dl): 82 Reference Range: 80 - 120 mg / dl Notes glucose per pt report Electronic Signature(s) Signed: 03/03/2021 5:07:48 PM By: [name] RN, BSN Entered By: [name] on 03/03/2021 07:48:13

## 2021-03-05 ENCOUNTER — Other Ambulatory Visit: Payer: Self-pay

## 2021-03-05 ENCOUNTER — Encounter (HOSPITAL_BASED_OUTPATIENT_CLINIC_OR_DEPARTMENT_OTHER): Payer: BC Managed Care – PPO | Attending: Internal Medicine | Admitting: Internal Medicine

## 2021-03-05 DIAGNOSIS — L97522 Non-pressure chronic ulcer of other part of left foot with fat layer exposed: Secondary | ICD-10-CM | POA: Insufficient documentation

## 2021-03-05 DIAGNOSIS — E11621 Type 2 diabetes mellitus with foot ulcer: Secondary | ICD-10-CM | POA: Diagnosis present

## 2021-03-05 NOTE — Progress Notes (Signed)
[name] ([id number]) Visit Report for 03/05/2021 Arrival Information Details Patient Name: Date of Service: [name] D 03/05/2021 7:30 A M Medical Record Number: [id number] Patient Account Number: [id number] Date of Birth/Sex: Treating RN: [birth year] (35 y.o. [name] Primary Care Tylique Aull: [name] Other Clinician: Referring Nahun Kronberg: Treating [name]/Extender: [name] in Treatment: 69 Visit Information History Since Last Visit Added or deleted any medications: No Patient Arrived: Ambulatory Any new allergies or adverse reactions: No Arrival Time: 07:52 Had a fall or experienced change in No Transfer Assistance: None activities of daily living that may affect Patient Identification Verified: Yes risk of falls: Secondary Verification Process Completed: Yes Signs or symptoms of abuse/neglect since No Patient Requires Transmission-Based Precautions: No last visito Patient Has Alerts: No Hospitalized since last visit: No Implantable device outside of the clinic No excluding cellular tissue based products placed in the center since last visit: Has Dressing in Place as Prescribed: Yes Has Compression in Place as Prescribed: Yes Has Footwear/Offloading in Place as Yes Prescribed: Left: Surgical Shoe with Pressure Relief Insole Pain Present Now: No Electronic Signature(s) Signed: 03/05/2021 11:53:13 AM By: [name] Entered By: [name] on 03/05/2021 07:53:14 -------------------------------------------------------------------------------- Encounter Discharge Information Details Patient Name: Date of Service: [name], [name] 03/05/2021 7:30 A M Medical Record Number: [id number] Patient Account Number: [id number] Date of Birth/Sex: Treating RN: 1985/07/24 (35 y.o. [name] Primary Care Beckett Hickmon: [name] Other Clinician: Referring [name]: Treating [name]/Extender: [name] in Treatment: 32 Encounter Discharge Information Items Discharge Condition: Stable Ambulatory Status: Ambulatory Discharge Destination: Home Transportation: Private Auto Schedule Follow-up Appointment: Yes Clinical Summary of Care: Provided on 03/05/2021 Form Type Recipient Paper Patient Patient Electronic Signature(s) Signed: 03/05/2021 11:53:13 AM By: [name] Signed: 03/05/2021 11:53:13 AM By: [name] Entered By: [name] on 03/05/2021 07:57:40 -------------------------------------------------------------------------------- Patient/Caregiver Education Details Patient Name: Date of Service: [name], [name] 12/2/2022andnbsp7:30 [place] Record Number: [id number] Patient Account Number: [id number] Date of Birth/Gender: Treating RN: 1985/11/06 (35 y.o. [name] Primary Care Physician: [name] Other Clinician: Referring Physician: Treating Physician/Extender: [name] in Treatment: 43 Education Assessment Education Provided To: Patient Education Topics Provided Elevated Blood Sugar/ Impact on Healing: Methods: Explain/Verbal Responses: State content correctly Offloading: Methods: Explain/Verbal Responses: State content correctly Wound/Skin Impairment: Methods: Explain/Verbal, Printed Responses: State content correctly Electronic Signature(s) Signed: 03/05/2021 11:53:13 AM By: [name] Entered By: [name] on 03/05/2021 07:57:02 -------------------------------------------------------------------------------- Wound Assessment Details Patient Name: Date of Service: [name], [name] 03/05/2021 7:30 A M Medical Record Number: [id number] Patient Account Number: [id number] Date of Birth/Sex: Treating RN: [birth year] (35 y.o. [name] Primary Care [name]: [name] Other Clinician: Referring [name]: Treating [name]/Extender: [name]  in Treatment: 72 Wound Status Wound Number: 3 Primary Etiology: Diabetic Wound/Ulcer of the Lower Extremity Wound Location: Left, Lateral Foot Wound Status: Open Wounding Event: Trauma Comorbid History: Type II Diabetes Date Acquired: 10/02/2019 Weeks Of Treatment: 71 Clustered Wound: No Wound Measurements Length: (cm) 2.2 Width: (cm) 1.8 Depth: (cm) 0.5 Area: (cm) 3.11 Volume: (cm) 1.555 % Reduction in Area: -88.6% % Reduction in Volume: -842.4% Epithelialization: Small (1-33%) Tunneling: No Undermining: No Wound Description Classification: Grade 2 Wound Margin: Epibole Exudate Amount: Large Exudate Type: Serosanguineous Exudate Color: red, brown Foul Odor After Cleansing: No Slough/Fibrino Yes Wound Bed Granulation Amount: Large (67-100%) Exposed Structure Granulation Quality: Red, Pink Fascia Exposed: No Necrotic  Amount: Small (1-33%) Fat Layer (Subcutaneous Tissue) Exposed: Yes Necrotic Quality: Adherent Slough Tendon Exposed: No Muscle Exposed: No Joint Exposed: No Bone Exposed: No Treatment Notes Wound #3 (Foot) Wound Laterality: Left, Lateral Cleanser Soap and Water Discharge Instruction: May shower and wash wound with dial antibacterial soap and water prior to dressing change. Wound Cleanser Discharge Instruction: Cleanse the wound with wound cleanser prior to applying a clean dressing using gauze sponges, not tissue or cotton balls. Peri-Wound Care Zinc Oxide Ointment 30g tube Discharge Instruction: Apply Zinc Oxide to periwound with each dressing change as needed. Topical keystone antibiotic compound Discharge Instruction: thin layer to wound bed Primary Dressing KerraCel Ag Gelling Fiber Dressing, 4x5 in (silver alginate) Discharge Instruction: Apply silver alginate to wound bed as instructed Secondary Dressing ABD Pad, 8x10 Discharge Instruction: Apply over primary dressing as directed. Zetuvit Plus 4x8 in Discharge Instruction: Apply over  primary dressing as directed. Secured With Compression Wrap Kerlix Roll 4.5x3.1 (in/yd) Discharge Instruction: Apply Kerlix and Coban compression as directed. Coban Self-Adherent Wrap 4x5 (in/yd) Discharge Instruction: Apply over Kerlix as directed. Compression Stockings Add-Ons Electronic Signature(s) Signed: 03/05/2021 11:53:13 AM By: Antonieta Iba Entered By: Antonieta Iba on 03/05/2021 07:55:48 -------------------------------------------------------------------------------- Vitals Details Patient Name: Date of Service: [name], [name] 03/05/2021 7:30 A M Medical Record Number: [id number] Patient Account Number: [id number] Date of Birth/Sex: Treating RN: [birth year] (35 y.o. [name] Primary Care Markos Theil: [name] Other Clinician: Referring Demetrick Eichenberger: Treating Chandani Rogowski/Extender: Dione Plover in Treatment: 7 Vital Signs Time Taken: 07:53 Temperature (F): 98.9 Height (in): 77 Pulse (bpm): 92 Weight (lbs): 280 Respiratory Rate (breaths/min): 16 Body Mass Index (BMI): 33.2 Blood Pressure (mmHg): 152/94 Capillary Blood Glucose (mg/dl): 409 Reference Range: 80 - 120 mg / dl Electronic Signature(s) Signed: 03/05/2021 11:53:13 AM By: Antonieta Iba Entered By: Antonieta Iba on 03/05/2021 07:53:54

## 2021-03-05 NOTE — Progress Notes (Signed)
[place], [name] ([id number]) Visit Report for 03/05/2021 Soft Cast Details Patient Name: Date of Service: [name] 03/05/2021 7:30 A M Medical Record Number: [id number] Patient Account Number: [id number] Date of Birth/Sex: Treating RN: [birth year] (35 y.o. [name] Primary Care Provider: [name] Other Clinician: Referring Provider: Treating Provider/Extender: Dione Plover in Treatment: 03 Procedure Performed for: Wound #3 Left,Lateral Foot Performed By: Clinician Antonieta Iba, RN Electronic Signature(s) Signed: 03/05/2021 9:35:33 AM By: Geralyn Corwin DO Signed: 03/05/2021 11:53:13 AM By: Antonieta Iba Entered By: Antonieta Iba on 03/05/2021 07:56:17 -------------------------------------------------------------------------------- SuperBill Details Patient Name: Date of Service: [name], [name] 03/05/2021 Medical Record Number: [id number] Patient Account Number: [id number] Date of Birth/Sex: Treating RN: 31-Jan-1986 (35 y.o. 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

## 2021-03-09 ENCOUNTER — Other Ambulatory Visit: Payer: Self-pay

## 2021-03-09 ENCOUNTER — Encounter (HOSPITAL_BASED_OUTPATIENT_CLINIC_OR_DEPARTMENT_OTHER): Payer: BC Managed Care – PPO | Admitting: Internal Medicine

## 2021-03-09 DIAGNOSIS — E11621 Type 2 diabetes mellitus with foot ulcer: Secondary | ICD-10-CM | POA: Diagnosis not present

## 2021-03-10 NOTE — Progress Notes (Signed)
[place], [name] ([id number]) Visit Report for 03/09/2021 Arrival Information Details Patient Name: Date of Service: [name] D 03/09/2021 7:30 A M Medical Record Number: [id number] Patient Account Number: [id number] Date of Birth/Sex: Treating RN: June 16, 1985 (35 y.o. [name] Primary Care Koda Defrank: [name] Other Clinician: Referring Darchelle Nunes: Treating Keniya Schlotterbeck/Extender: [name] in Treatment: 21 Visit Information History Since Last Visit Added or deleted any medications: No Patient Arrived: Ambulatory Any new allergies or adverse reactions: No Arrival Time: 07:47 Had a fall or experienced change in No Transfer Assistance: None activities of daily living that may affect Patient Identification Verified: Yes risk of falls: Secondary Verification Process Completed: Yes Signs or symptoms of abuse/neglect since last visito No Patient Requires Transmission-Based Precautions: No Hospitalized since last visit: No Patient Has Alerts: No Implantable device outside of the clinic excluding No cellular tissue based products placed in the center since last visit: Has Dressing in Place as Prescribed: Yes Has Compression in Place as Prescribed: Yes Has Footwear/Offloading in Place as Prescribed: Yes Left: Multipodus Split/Boot Pain Present Now: No Electronic Signature(s) Signed: 03/09/2021 3:40:48 PM By: [name] Entered By: [name] on 03/09/2021 07:47:50 -------------------------------------------------------------------------------- Encounter Discharge Information Details Patient Name: Date of Service: [name], [name] 03/09/2021 7:30 A M Medical Record Number: [id number] Patient Account Number: [id number] Date of Birth/Sex: Treating RN: Aug 19, 1985 (35 y.o. [name] Primary Care Shahzad Thomann: [name] Other Clinician: Referring Orma Cheetham: Treating Laverle Pillard/Extender: [name] in  Treatment: 22 Encounter Discharge Information Items Post Procedure Vitals Discharge Condition: Stable Temperature (F): 98.9 Ambulatory Status: Ambulatory Pulse (bpm): 93 Discharge Destination: Home Respiratory Rate (breaths/min): 18 Transportation: Private Auto Blood Pressure (mmHg): 136/84 Schedule Follow-up Appointment: Yes Clinical Summary of Care: Provided on 03/09/2021 Form Type Recipient Paper Patient Patient Electronic Signature(s) Signed: 03/09/2021 3:40:48 PM By: [name] Entered By: [name] on 03/09/2021 08:51:51 -------------------------------------------------------------------------------- Lower Extremity Assessment Details Patient Name: Date of Service: [name] D 03/09/2021 7:30 A M Medical Record Number: [id number] Patient Account Number: [id number] Date of Birth/Sex: Treating RN: [birth year] (35 y.o. [name] Primary Care Aidynn Polendo: [name] Other Clinician: Referring Apollonia Amini: Treating Sritha Chauncey/Extender: [name] in Treatment: 71 Edema Assessment Assessed: [name]: Yes] [name]: No] Edema: [Left: Ye] [Right: s] Calf Left: Right: Point of Measurement: 48 cm From Medial Instep 48.2 cm Ankle Left: Right: Point of Measurement: 11 cm From Medial Instep 28.5 cm Vascular Assessment Pulses: Dorsalis Pedis Palpable: [Left:Yes] Electronic Signature(s) Signed: 03/09/2021 3:40:48 PM By: [name] Entered By: [name] on 03/09/2021 07:53:14 -------------------------------------------------------------------------------- Multi Wound Chart Details Patient Name: Date of Service: [name], [name] 03/09/2021 7:30 A M Medical Record Number: [id number] Patient Account Number: [id number] Date of Birth/Sex: Treating RN: 1985-07-12 (35 y.o. [name] Primary Care Kailash Hinze: [name] Other Clinician: Referring Damir Leung: Treating Rikita Grabert/Extender: [name]  in Treatment: 50 Vital Signs Height(in): 77 Capillary Blood Glucose(mg/dl): 120 Weight(lbs): 280 Pulse(bpm): 93 Body Mass Index(BMI): 33 Blood Pressure(mmHg): 136/84 Temperature(F): 98.8 Respiratory Rate(breaths/min): 18 Photos: [3:Left, Lateral Foot] [N/A:N/A N/A] Wound Location: [3:Trauma] [N/A:N/A] Wounding Event: [3:Diabetic Wound/Ulcer of the Lower] [N/A:N/A] Primary Etiology: [3:Extremity Type II Diabetes] [N/A:N/A] Comorbid History: [3:10/02/2019] [N/A:N/A] Date Acquired: [3:71] [N/A:N/A] Weeks of Treatment: [3:Open] [N/A:N/A] Wound Status: [3:2.2x1.8x0.4] [N/A:N/A] Measurements L x W x D (cm) [3:3.11] [N/A:N/A] A (cm) : rea [3:1.244] [N/A:N/A] Volume (cm) : [3:-88.60%] [N/A:N/A] % Reduction in A [3:rea: -653.90%] [N/A:N/A] % Reduction in Volume: [  3:Grade 2] [N/A:N/A] Classification: [3:Large] [N/A:N/A] Exudate A mount: [3:Serosanguineous] [N/A:N/A] Exudate Type: [3:red, brown] [N/A:N/A] Exudate Color: [3:Epibole] [N/A:N/A] Wound Margin: [3:Large (67-100%)] [N/A:N/A] Granulation A mount: [3:Red, Pink] [N/A:N/A] Granulation Quality: [3:Small (1-33%)] [N/A:N/A] Necrotic A mount: [3:Fat Layer (Subcutaneous Tissue): Yes N/A] Exposed Structures: [3:Fascia: No Tendon: No Muscle: No Joint: No Bone: No Small (1-33%)] [N/A:N/A] Epithelialization: [3:Debridement - Excisional] [N/A:N/A] Debridement: Pre-procedure Verification/Time Out 08:09 [N/A:N/A] Taken: [3:Other] [N/A:N/A] Pain Control: [3:Callus, Subcutaneous] [N/A:N/A] Tissue Debrided: [3:Skin/Subcutaneous Tissue] [N/A:N/A] Level: [3:3.96] [N/A:N/A] Debridement A (sq cm): [3:rea Curette] [N/A:N/A] Instrument: [3:Minimum] [N/A:N/A] Bleeding: [3:Pressure] [N/A:N/A] Hemostasis A chieved: [3:Procedure was tolerated well] [N/A:N/A] Debridement Treatment Response: [3:2.2x1.8x0.4] [N/A:N/A] Post Debridement Measurements L x W x D (cm) [3:1.244] [N/A:N/A] Post Debridement Volume: (cm) [3:Calloused periwound]  [N/A:N/A] Assessment Notes: [3:Debridement] [N/A:N/A] Procedures Performed: [3:Soft Cast] Treatment Notes Electronic Signature(s) Signed: 03/09/2021 4:21:48 PM By: [name] MD Signed: 03/10/2021 4:34:42 PM By: Rhae Hammock RN Entered By: [name] on 03/09/2021 08:28:26 -------------------------------------------------------------------------------- Multi-Disciplinary Care Plan Details Patient Name: Date of Service: [name], [name] 03/09/2021 7:30 A M Medical Record Number: [id number] Patient Account Number: [id number] Date of Birth/Sex: Treating RN: 08-30-1985 (35 y.o. [name] Primary Care Moneka Mcquinn: [name] Other Clinician: Referring Jahrell Hamor: Treating Harshitha Fretz/Extender: [name] in Treatment: 55 Multidisciplinary Care Plan reviewed with physician Active Inactive Nutrition Nursing Diagnoses: Imbalanced nutrition Potential for alteratiion in Nutrition/Potential for imbalanced nutrition Goals: Patient/caregiver agrees to and verbalizes understanding of need to use nutritional supplements and/or vitamins as prescribed Date Initiated: 10/24/2019 Date Inactivated: 04/06/2020 Target Resolution Date: 04/03/2020 Goal Status: Met Patient/caregiver will maintain therapeutic glucose control Date Initiated: 10/24/2019 Target Resolution Date: 04/02/2021 Goal Status: Active Interventions: Assess HgA1c results as ordered upon admission and as needed Assess patient nutrition upon admission and as needed per policy Provide education on elevated blood sugars and impact on wound healing Provide education on nutrition Treatment Activities: Education provided on Nutrition : 11/24/2020 Notes: 11/17/20: Glucose control ongoing issue, target date extended. 01/26/21: Glucose management continues. Wound/Skin Impairment Nursing Diagnoses: Impaired tissue integrity Knowledge deficit related to ulceration/compromised skin  integrity Goals: Patient/caregiver will verbalize understanding of skin care regimen Date Initiated: 10/24/2019 Target Resolution Date: 04/02/2021 Goal Status: Active Ulcer/skin breakdown will have a volume reduction of 30% by week 4 Date Initiated: 10/24/2019 Date Inactivated: 01/16/2020 Target Resolution Date: 01/10/2020 Unmet Reason: no change in Goal Status: Unmet measurements. Interventions: Assess patient/caregiver ability to obtain necessary supplies Assess patient/caregiver ability to perform ulcer/skin care regimen upon admission and as needed Assess ulceration(s) every visit Provide education on ulcer and skin care Notes: 11/17/20: Wound care regimen continues Electronic Signature(s) Signed: 03/09/2021 3:40:48 PM By: [name] Entered By: [name] on 03/09/2021 08:01:56 -------------------------------------------------------------------------------- Pain Assessment Details Patient Name: Date of Service: [name] D 03/09/2021 7:30 A M Medical Record Number: [id number] Patient Account Number: [id number] Date of Birth/Sex: Treating RN: 09/18/1985 (35 y.o. [name] Primary Care Zuriah Bordas: [name] Other Clinician: Referring Macgregor Aeschliman: Treating Anahla Bevis/Extender: [name] in Treatment: 38 Active Problems Location of Pain Severity and Description of Pain Patient Has Paino No Site Locations Pain Management and Medication Current Pain Management: Electronic Signature(s) Signed: 03/09/2021 3:40:48 PM By: [name] Entered By: [name] on 03/09/2021 07:52:54 -------------------------------------------------------------------------------- Patient/Caregiver Education Details Patient Name: Date of Service: [name], [name] 12/6/2022andnbsp7:30 A M Medical Record Number: [id number] Patient Account Number: [id number] Date of Birth/Gender: Treating RN: 24-Dec-1985 (35 y.o. [name] Primary Care  Physician: [name] Other Clinician: Referring Physician: Treating Physician/Extender: [name] in Treatment: 27 Education Assessment Education Provided To: Patient Education Topics Provided Elevated Blood Sugar/ Impact on Healing: Methods: Explain/Verbal Responses: State content correctly Offloading: Methods: Demonstration, Explain/Verbal, Printed Responses: State content correctly Venous: Methods: Explain/Verbal, Printed Responses: State content correctly Wound/Skin Impairment: Methods: Explain/Verbal, Pharmacist, hospital) Signed: 03/09/2021 3:40:48 PM By: [name] Entered By: [name] on 03/09/2021 08:02:28 -------------------------------------------------------------------------------- Wound Assessment Details Patient Name: Date of Service: [name], [name] 03/09/2021 7:30 A M Medical Record Number: [id number] Patient Account Number: [id number] Date of Birth/Sex: Treating RN: [birth year] (35 y.o. [name] Primary Care Armanie Ullmer: [name] Other Clinician: Referring Wendall [name]: Treating Citlaly Camplin/Extender: [name] in Treatment: 71 Wound Status Wound Number: 3 Primary Etiology: Diabetic Wound/Ulcer of the Lower Extremity Wound Location: Left, Lateral Foot Wound Status: Open Wounding Event: Trauma Comorbid History: Type II Diabetes Date Acquired: 10/02/2019 Weeks Of Treatment: 71 Clustered Wound: No Photos Wound Measurements Length: (cm) 2.2 Width: (cm) 1.8 Depth: (cm) 0.4 Area: (cm) 3.11 Volume: (cm) 1.244 % Reduction in Area: -88.6% % Reduction in Volume: -653.9% Epithelialization: Small (1-33%) Tunneling: No Undermining: No Wound Description Classification: Grade 2 Wound Margin: Epibole Exudate Amount: Large Exudate Type: Serosanguineous Exudate Color: red, brown Foul Odor After Cleansing: No Slough/Fibrino Yes Wound Bed Granulation Amount: Large  (67-100%) Exposed Structure Granulation Quality: Red, Pink Fascia Exposed: No Necrotic Amount: Small (1-33%) Fat Layer (Subcutaneous Tissue) Exposed: Yes Necrotic Quality: Adherent Slough Tendon Exposed: No Muscle Exposed: No Joint Exposed: No Bone Exposed: No Assessment Notes Calloused periwound Treatment Notes Wound #3 (Foot) Wound Laterality: Left, Lateral Cleanser Soap and Water Discharge Instruction: May shower and wash wound with dial antibacterial soap and water prior to dressing change. Wound Cleanser Discharge Instruction: Cleanse the wound with wound cleanser prior to applying a clean dressing using gauze sponges, not tissue or cotton balls. Peri-Wound Care Zinc Oxide Ointment 30g tube Discharge Instruction: Apply Zinc Oxide to periwound with each dressing change as needed. Topical keystone antibiotic compound Discharge Instruction: thin layer to wound bed Primary Dressing KerraCel Ag Gelling Fiber Dressing, 4x5 in (silver alginate) Discharge Instruction: Apply silver alginate to wound bed as instructed Secondary Dressing ABD Pad, 8x10 Discharge Instruction: Apply over primary dressing as directed. Zetuvit Plus 4x8 in Discharge Instruction: Apply over primary dressing as directed. Secured With Compression Wrap Kerlix Roll 4.5x3.1 (in/yd) Discharge Instruction: Apply Kerlix and Coban compression as directed. Coban Self-Adherent Wrap 4x5 (in/yd) Discharge Instruction: Apply over Kerlix as directed. Compression Stockings Add-Ons Electronic Signature(s) Signed: 03/09/2021 3:40:48 PM By: [name] Entered By: [name] on 03/09/2021 07:59:24 -------------------------------------------------------------------------------- Vitals Details Patient Name: Date of Service: [name], [name] 03/09/2021 7:30 A M Medical Record Number: [id number] Patient Account Number: [id number] Date of Birth/Sex: Treating RN: [birth year] (35 y.o. [name] Primary Care  Pearlene Teat: [name] Other Clinician: Referring Jenea Dake: Treating Terrence Wishon/Extender: [name] in Treatment: 71 Vital Signs Time Taken: 07:49 Temperature (F): 98.8 Height (in): 77 Pulse (bpm): 93 Weight (lbs): 280 Respiratory Rate (breaths/min): 18 Body Mass Index (BMI): 33.2 Blood Pressure (mmHg): 136/84 Capillary Blood Glucose (mg/dl): 120 Reference Range: 80 - 120 mg / dl Electronic Signature(s) Signed: 03/09/2021 3:40:48 PM By: [name] Entered By: [name] on 03/09/2021 07:49:30

## 2021-03-10 NOTE — Progress Notes (Signed)
[place], [name] ([id number]) Visit Report for 03/09/2021 Debridement Details Patient Name: Date of Service: [name] 03/09/2021 7:30 A M Medical Record Number: [id number] Patient Account Number: [id number] Date of Birth/Sex: Treating RN: 18-Aug-1985 (35 y.o. [name], [name] Primary Care Provider: [name] Other Clinician: Referring Provider: Treating Provider/Extender: [name] in Treatment: 71 Debridement Performed for Assessment: Wound #3 Left,Lateral Foot Performed By: Physician [name], MD Debridement Type: Debridement Severity of Tissue Pre Debridement: Fat layer exposed Level of Consciousness (Pre-procedure): Awake and Alert Pre-procedure Verification/Time Out Yes - 08:09 Taken: Start Time: 08:10 Pain Control: Other : Benzocaine T Area Debrided (L x W): otal 2.2 (cm) x 1.8 (cm) = 3.96 (cm) Tissue and other material debrided: Non-Viable, Callus, Subcutaneous Level: Skin/Subcutaneous Tissue Debridement Description: Excisional Instrument: Curette Bleeding: Minimum Hemostasis Achieved: Pressure End Time: 08:15 Response to Treatment: Procedure was tolerated well Level of Consciousness (Post- Awake and Alert procedure): Post Debridement Measurements of Total Wound Length: (cm) 2.2 Width: (cm) 1.8 Depth: (cm) 0.4 Volume: (cm) 1.244 Character of Wound/Ulcer Post Debridement: Stable Severity of Tissue Post Debridement: Fat layer exposed Post Procedure Diagnosis Same as Pre-procedure Electronic Signature(s) Signed: 03/09/2021 4:21:48 PM By: [name] MD Signed: 03/10/2021 4:34:42 PM By: Rhae Hammock RN Entered By: [name] on 03/09/2021 08:28:40 -------------------------------------------------------------------------------- HPI Details Patient Name: Date of Service: [name], [name] 03/09/2021 7:30 A M Medical Record Number: [id number] Patient Account Number: [id number] Date of Birth/Sex: 35Treating RN: October 15, 1985 (35 y.o. 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 He comes in for review of a wound over the plantar fifth metatarsal head extending into the lateral part of the foot. He was followed for this previously by his podiatrist Dr. Cornelius Moras. As the patient tells his story he went to see podiatry first for a swelling he developed on the lateral part of his fifth metatarsal head in May. He states this was "open" by podiatry and the area closed. He was followed up in June and it was again opened callus removed and it closed promptly. There were plans being made for surgery on the fifth metatarsal head in June however his blood sugar was apparently too high for anesthesia. Apparently the area was debrided and opened again in June and it is never closed since. Looking over the records from podiatry I am really not able to follow this. It was clear when he was first seen it was before 5/14 at that point he already had a wound. By 5/17 the ulcer was resolved. I do not see anything about a procedure. On 5/28 noted to have pre-ulcerative moderate keratosis. X-ray noted 1/5 contracted toe and tailor's bunion and metatarsal deformity. On a visit date on 09/28/2018 the dorsal part of the left foot it healed and resolved. There was concern about swelling in his lower extremity he was sent to the ER.. As far as I can tell he was seen in the ER on 7/12 with an ulcer on his left foot. A DVT rule out of the left leg was negative. I do not think I have complete records from podiatry but I am not able to verify the procedures this patient states he had. He states after the last procedure the wound has never closed  although I am not able to follow this in the records I have from podiatry. He has not had a recent x-ray The patient has been using Neosporin on the wound. He is wearing a Darco shoe. He is still very active up on his foot working and exercising. Past medical history; type 2 diabetes ketosis-prone, leg swelling with a negative DVT study in July. Non-smoker ABI in our clinic was 0.85 on the left 10/16; substantial wound on the plantar left fifth met head extending laterally almost to the dorsal fifth MTP. We have been using silver alginate we gave him a Darco forefoot off loader. An x-ray did not show evidence of osteomyelitis did note soft tissue emphysema which I think was due to gas tracking through an open wound. There is no doubt in my mind he requires an MRI 10/23; MRI not booked until 3 November at the earliest this is largely due to his glucose sensor in the right arm. We have been using silver alginate. There has been an improvement 10/29; I am still not exactly sure when his MRI is booked for. He says it is the third but it is the 10th in epic. This definitely needs to be done. He is running a low-grade fever today but no other symptoms. No real improvement in the 1 02/26/2019 patient presents today for a follow-up visit here in our clinic he is last been seen in the clinic on October 29. Subsequently we were working on getting MRI to evaluate and see what exactly was going on and where we would need to go from the standpoint of whether or not he had osteomyelitis and again what treatments were going be required. Subsequently the patient ended up being admitted to the hospital on 02/07/2019 and was discharged on 02/14/2019. This is a somewhat interesting admission with a discharge diagnosis of pneumonia due to COVID-19 although he was positive for COVID-19 when tested at the urgent care but negative x2 when he was actually in the hospital. With that being said he did have acute respiratory  failure with hypoxia and it was noted he also have a left foot ulceration with osteomyelitis. With that being said he did require oxygen for his pneumonia and I level 4 L. He was placed on antivirals and steroids for the COVID-19. He was also transferred to the [facility] at one point. Nonetheless he did subsequently discharged home and since being home has done much better in that regard. The CT angiogram did not show any pulmonary embolism. With regard to the osteomyelitis the patient was placed on vancomycin and Zosyn while in the hospital but has been changed to Augmentin at discharge. It was also recommended that he follow- up with wound care and podiatry. Podiatry however wanted him to see [name] according to the patient prior to them doing anything further. His hemoglobin A1c was 9.9 as noted in the hospital. Have an MRI of the left foot performed while in the hospital on 02/04/2019. 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 There is an overlying plantar open wound noted an abscess tracking back along the lateral aspect of the fifth metatarsal shaft. There is otherwise diffuse cellulitis and mild fasciitis without findings of polymyositis. The patient did have recently pneumonia secondary to COVID-19 I looked in the chart through epic and it does appear that the patient may need to have an additional x-ray just to ensure everything  is cleared and that he has no airspace disease prior to putting him into the chamber. 03/05/2019; patient was readmitted to the clinic last week. He was hospitalized twice for a viral upper respiratory tract infection from 11/1 through 11/4 and then 11/5 through 11/12 ultimately this turned out to be Covid pneumonitis. Although he was discharged on oxygen he is not using it. He says he feels fine. He has no exercise limitation no cough no sputum. His O2 sat in our  clinic today was 100% on room air. 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 He received Vanco and Zosyn in the hospital and then was discharged on 2 weeks of Augmentin. I do not see any relevant cultures. He was supposed to follow-up with infectious disease but I do not see that he has an appointment. 12/8; patient saw Dr. Novella Olive of infectious disease last week. He felt that he had had adequate antibiotic therapy. He did not go to follow-up with Dr. Amalia Hailey of podiatry and I have again talked to him about the pros and cons of this. He does not want to consider a ray amputation of this time. He is aware of the risks of recurrence, migration etc. He started HBO today and tolerated this well. He can complete the Augmentin that I gave him last week. I have looked over the lab work that Dr. Chana Bode ordered his C-reactive protein was 3.3 and his sedimentation rate was 17. The C-reactive protein is never really been measurably that high in this patient 12/15; not much change in the wound today however he has undermining along the lateral part of the foot again more extensively than last week. He has some rims of epithelialization. We have been using silver alginate. He is undergoing hyperbarics but did not dive today 12/18; in for his obligatory first total contact cast change. Unfortunately there was pus coming from the undermining area around his fifth metatarsal head. This was cultured but will preclude reapplication of a cast. He is seen in conjunction with HBO 12/24; patient had staph lugdunensis in the wound in the undermining area laterally last time. We put him on doxycycline which should have covered this. The wound looks better today. I am going to give him another week of doxycycline before reattempting the total contact cast 12/31; the patient is completing antibiotics. Hemorrhagic  debris in the distal part of the wound with some undermining distally. He also had hyper granulation. Extensive debridement with a #5 curette. The infected area that was on the lateral part of the fifth met head is closed over. I do not think he needs any more antibiotics. Patient was seen prior to HBO. Preparations for a total contact cast were made in the cast will be placed post hyperbarics 04/11/19; once again the patient arrives today without complaint. He had been in a cast all week noted that he had heavy drainage this week. This resulted in large raised areas of macerated tissue around the wound 1/14; wound bed looks better slightly smaller. Hydrofera Blue has been changing himself. He had a heavy drainage last week which caused a lot of maceration around the wound so I took him out of a total contact cast he says the drainage is actually better this week He is seen today in conjunction with HBO 1/21; returns to clinic. He was up in [place] for a day or 2 attending a  funeral. He comes back in with the wound larger and with a large area of exposed bone. He had osteomyelitis and septic arthritis of the fifth left metatarsal head while he was in hospital. He received IV antibiotics in the hospital for a prolonged period of time then 3 weeks of Augmentin. Subsequently I gave him 2 weeks of doxycycline for more superficial wound infection. When I saw this last week the wound was smaller the surface of the wound looks satisfactory. 1/28; patient missed hyperbarics today. Bone biopsy I did last time showed Enterococcus faecalis and Staphylococcus lugdunensis . He has a wide area of exposed bone. We are going to use silver alginate as of today. I had another ethical discussion with the patient. 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 In this situation I think the likelihood of healing this is low. Therefore I have recommended a ray amputation and with the patient's  agreement I have referred him to Dr. Doran Durand. The other issue is that his compliance with hyperbarics has been minimal because of his work schedule and given his underlying decision I am going to stop this today READMISSION 10/24/2019 MRI 09/29/2019 left foot IMPRESSION: 1. Apparent skin ulceration inferior and lateral to the 5th metatarsal base with underlying heterogeneous T2 signal and enhancement in the subcutaneous fat. Small peripherally enhancing fluid collections along the plantar and lateral aspects of the 5th metatarsal base suspicious for abscesses. 2. Interval amputation through the mid 5th metatarsal with nonspecific low-level marrow edema and enhancement. Given the proximity to the adjacent soft tissue inflammatory changes, osteomyelitis cannot be excluded. 3. The additional bones appear unremarkable. MRI 09/29/2019 right foot IMPRESSION: 1. Soft tissue ulceration lateral to the 5th MTP joint. There is low-level T2 hyperintensity within the 4th and 5th metatarsal heads and adjacent proximal phalanges without abnormal T1 signal or cortical destruction. These findings are nonspecific and could be seen with early marrow edema, hyperemia or early osteomyelitis. No evidence of septic joint. 2. Mild tenosynovitis and synovial enhancement associated with the extensor digitorum tendons at the level of the midfoot. 3. Diffuse low-level muscular T2 hyperintensity and enhancement, most consistent with diabetic myopathy. LEFT FOOT BONE Methicillin resistant staphylococcus aureus Staphylococcus lugdunensis MIC MIC CIPROFLOXACIN >=8 RESISTANT Resistant <=0.5 SENSI... Sensitive CLINDAMYCIN <=0.25 SENS... Sensitive >=8 RESISTANT Resistant ERYTHROMYCIN >=8 RESISTANT Resistant >=8 RESISTANT Resistant GENTAMICIN <=0.5 SENSI... Sensitive <=0.5 SENSI... Sensitive Inducible Clindamycin NEGATIVE Sensitive NEGATIVE Sensitive OXACILLIN >=4 RESISTANT Resistant 2 SENSITIVE Sensitive RIFAMPIN  <=0.5 SENSI... Sensitive <=0.5 SENSI... Sensitive TETRACYCLINE <=1 SENSITIVE Sensitive <=1 SENSITIVE Sensitive TRIMETH/SULFA <=10 SENSIT Sensitive <=10 SENSIT Sensitive ... Marland Kitchen.. VANCOMYCIN 1 SENSITIVE Sensitive <=0.5 SENSI... Sensitive Right foot bone . Component 3 wk ago Specimen Description BONE Special Requests RIGHT 4 METATARSAL SAMPLE B Gram Stain NO WBC SEEN NO ORGANISMS SEEN Culture RARE METHICILLIN RESISTANT STAPHYLOCOCCUS AUREUS NO ANAEROBES ISOLATED Performed at [facility], [place] [address], [place], [place] [address] Report Status 10/08/2019 FINAL Organism ID, Bacteria METHICILLIN RESISTANT STAPHYLOCOCCUS AUREUS Resulting Agency [facility] Susceptibility Methicillin resistant staphylococcus aureus MIC CIPROFLOXACIN >=8 RESISTANT Resistant CLINDAMYCIN <=0.25 SENS... Sensitive ERYTHROMYCIN >=8 RESISTANT Resistant GENTAMICIN <=0.5 SENSI... Sensitive Inducible Clindamycin NEGATIVE Sensitive OXACILLIN >=4 RESISTANT Resistant RIFAMPIN <=0.5 SENSI... Sensitive TETRACYCLINE <=1 SENSITIVE Sensitive TRIMETH/SULFA <=10 SENSIT Sensitive ... VANCOMYCIN 1 SENSITIVE Sensitive This is a patient we had in clinic earlier this year with a wound over his left fifth metatarsal head. 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 Doran Durand on 05/16/2019. He seems to have developed problems with wounds on his bilateral feet in June 2021 although he may have had problems earlier than this. He was in an urgent care with a right foot ulcer on 09/26/2019 and given a course of doxycycline. This was apparently after having trouble getting into see orthopedics. He was seen by podiatry on 09/28/2019 noted to have bilateral  lower extremity ulcers including the left lateral fifth metatarsal base and the right subfifth met head. It was noted that had purulent drainage at that time. He required hospitalization from 6/20 through 7/2. This was because of worsening right foot wounds. He underwent bilateral operative incision and drainage and bone biopsies bilaterally. Culture results are listed above. He has been referred back to clinic by Dr. Jacqualyn Posey of podiatry. He is also followed by Dr. Megan Salon who saw him yesterday. He was discharged from hospital on Zyvox Flagyl and Levaquin and yesterday changed to doxycycline Flagyl and Levaquin. His inflammatory markers on 6/26 showed a sedimentation rate of 129 and a C-reactive protein of 5. This is improved to 14 and 1.3 respectively. This would indicate improvement. ABIs in our clinic today were 1.23 on the right and 1.20 on the left 11/01/2019 on evaluation today patient appears to be doing fairly well in regard to the wounds on his feet at this point. Fortunately there is no signs of active infection at this time. No fevers, chills, nausea, vomiting, or diarrhea. He currently is seeing infectious disease and still under their care at this point. Subsequently he also has both wounds which she has not been using collagen on as he did not receive that in his packaging he did not call us and let us know that. Apparently that just was missed on the order. Nonetheless we will get that straightened out today. 8/9-Patient returns for bilateral foot wounds, using Prisma with hydrogel moistened dressings, and the wounds appear stable. Patient using surgical shoes, avoiding much pressure or weightbearing as much as possible 8/16; patient has bilateral foot wounds. 1 on the right lateral foot proximally the other is on the left mid lateral foot. Both required debridement of callus and thick skin around the wounds. We have been using silver collagen 8/27; patient has bilateral lateral foot  wounds. The area on the left substantially surrounded by callus and dry skin. This was removed from the wound edge. The underlying wound is small. The area on the right measured somewhat smaller today. 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 Unfortunately I did not update his antibiotics during today's visit. 9/10 I reviewed Dr. Hale Bogus last notes he felt he had completed antibiotics his inflammatory markers were reasonably well controlled. He has a small wound on the lateral left foot and a tiny area on the right which is just above closed. He is using Hydrofera Blue with border foam he has bilateral surgical shoes 9/24; 2 week f/u. doing well. right foot is closed. left foot still undermined. 10/14; right foot remains closed at the fifth met head. The area over the base of the left fifth metatarsal has a small open area but considerable undermining towards the plantar foot. Thick callus skin around this suggests an adequate pressure relief. We have  talked about this. He says he is going to go back into his cam boot. I suggested a total contact cast he did not seem enamored with this suggestion 10/26; left foot base of the fifth metatarsal. Same condition as last time. He has skin over the area with an open wound however the skin is not adherent. He went to see Dr. Earleen Newport who did an x-ray and culture of his foot I have not reviewed the x-ray but the patient was not told anything. He is on doxycycline 11/11; since the patient was last here he was in the emergency room on 10/30 he was concerned about swelling in the left foot. They did not do any cultures or x-rays. They changed his antibiotics to cephalexin. Previous culture showed group B strep. The cephalexin is appropriate as doxycycline has less than predictable coverage. Arrives in clinic today with swelling over this area under the wound. He also has a new wound on the right  fifth metatarsal head 11/18; the patient has a difficult wound on the lateral aspect of the left fifth metatarsal head. The wound was almost ballotable last week I opened it slightly expecting to see purulence however there was just bleeding. I cultured this this was negative. X-ray unchanged. We are trying to get an MRI but I am not sure were going to be able to get this through his insurance. He also has an area on the right lateral fifth metatarsal head this looks healthier 12/3; the patient finally got our MRI. Surprisingly this did not show osteomyelitis. I did show the soft tissue ulceration at the lateral plantar aspect of the fifth metatarsal base with a tiny residual 6 mm abscess overlying the superficial fascia I have tried to culture this area I have not been able to get this to grow anything. Nevertheless the protruding tissue looks aggravated. I suspect we should try to treat the underlying "abscess with broad-spectrum antibiotics. I am going to start him on Levaquin and Flagyl. He has much less edema in his legs and I am going to continue to wrap his legs and see him weekly 12/10. I started Levaquin and Flagyl on him last week. He just picked up the Flagyl apparently there was some delay. The worry is the wound on the left fifth metatarsal base which is substantial and worsening. His foot looks like he inverts at the ankle making this a weightbearing surface. Certainly no improvement in fact I think the measurements of this are somewhat worse. We have been using 12/17; he apparently just got the Levaquin yesterday this is 2 weeks after the fact. He has completed the Flagyl. The area over the left fifth metatarsal base still has protruding granulation tissue although it does not look quite as bad as it did some weeks ago. He has severe bilateral lymphedema although we have not been treating him for wounds on his legs this is definitely going to require compression. There was so much edema  in the left I did not wish to put him in a total contact cast today. I am going to increase his compression from 3-4 layer. The area on the right lateral fifth met head actually look quite good and superficial. 12/23; patient arrived with callus on the right fifth met head and the substantial hyper granulated callused wound on the base of his fifth metatarsal. He says he is completing his Levaquin in 2 days but I do not think that adds up with what I gave him but I  will have to double check this. We are using Hydrofera Blue on both areas. My plan is to put the left leg in a cast the week after New Year's 04/06/2020; patient's wounds about the same. Right lateral fifth metatarsal head and left lateral foot over the base of the fifth metatarsal. There is undermining on the left lateral foot which I removed before application of total contact cast continuing with Hydrofera Blue new. Patient tells me he was seen by endocrinology today lab work was done [Dr. Kerr]. Also wondering whether he was referred to cardiology. I went over some lab work from previously does not have chronic renal failure certainly not nephrotic range proteinuria he does have very poorly controlled diabetes but this is not his most updated lab work. Hemoglobin A1c has been over 11 1/10; the patient had a considerable amount of leakage towards mid part of his left foot with macerated skin however the wound surface looks better the area on the right lateral fifth met head is better as well. I am going to change the dressing on the left foot under the total contact cast to silver alginate, continue with Hydrofera Blue on the right. 1/20; patient was in the total contact cast for 10 days. Considerable amount of drainage although the skin around the wound does not look too bad on the left foot. The area on the right fifth metatarsal head is closed. Our nursing staff reports large amount of drainage out of the left lateral foot wound 1/25;  continues with copious amounts of drainage described by our intake staff. PCR culture I did last week showed E. coli and Enterococcus faecalis and low quantities. Multiple resistance genes documented including extended spectrum beta lactamase, MRSA, MRSE, quinolone, tetracycline. The wound is not quite as good this week as it was 5 days ago but about the same size 2/3; continues with copious amounts of malodorous drainage per our intake nurse. The PCR culture I did 2 weeks ago showed E. coli and low quantities of Enterococcus. There were multiple resistance genes detected. I put Neosporin on him last week although this does not seem to have helped. The wound is slightly deeper today. Offloading continues to be an issue here although with the amount of drainage she has a total contact cast is just not going to work 2/10; moderate amount of drainage. Patient reports he cannot get his stocking on over the dressing. I told him we have to do that the nurse gave him suggestions on how to make this work. The wound is on the bottom and lateral part of his left foot. Is cultured predominantly grew low amounts of Enterococcus, E. coli and anaerobes. There were multiple resistance genes detected including extended spectrum beta lactamase, quinolone, tetracycline. I could not think of an easy oral combination to address this so for now I am going to do topical antibiotics provided by [facility] I think the main agents here are vancomycin and an aminoglycoside. We have to be able to give him access to the wounds to get the topical antibiotic on 2/17; moderate amount of drainage this is unchanged. He has his Keystone topical antibiotic against the deep tissue culture organisms. He has been using this and changing the dressing daily. Silver alginate on the wound surface. 2/24; using Keystone antibiotic with silver alginate on the top. He had too much drainage for a total contact cast at one point although I think that  is improving and I think in the next week or 2  it might be possible to replace a total contact cast I did not do this today. In general the wound surface looks healthy however he continues to have thick rims of skin and subcutaneous tissue around the wide area of the circumference which I debrided 06/04/2020 upon evaluation today patient appears to be doing well in regard to his wound. I do feel like he is showing signs of improvement. There is little bit of callus and dead tissue around the edges of the wound as well as what appears to be a little bit of a sinus tract that is off to the side laterally I would perform debridement to clear that away today. 3/17; left lateral foot. The wound looks about the same as I remember. Not much depth surface looks healthy. No evidence of infection 3/25; left lateral foot. Wound surface looks about the same. Separating epithelium from the circumference. There really is no evidence of infection here however not making progress by my view 3/29; left lateral foot. Surface of the wound again looks reasonably healthy still thick skin and subcutaneous tissue around the wound margins. There is no evidence of infection. One of the concerns being brought up by the nurses has again the amount of drainage vis--vis continued use of a total contact cast 4/5; left lateral foot at roughly the base of the fifth metatarsal. Nice healthy looking granulated tissue with rims of epithelialization. The overall wound measurements are not any better but the tissue looks healthy. The only concern is the amount of drainage although he has no surrounding maceration with what we have been doing recently to absorb fluid and protect his skin. He also has lymphedema. 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 He states that the kerlix/coban under the TCC rolled down and could not get it back up. He has been using an offloading scooter and has somehow hurt his right foot using this device. This happened last week. He states that the side of his right foot developed a blister and opened. The top of his foot also has a few small open wounds he thinks is due to his socks rubbing in his shoes. He has not been using any dressings to the wound. He denies purulent drainage, fever/chills or erythema to the wounds. 4/22; patient presents for 1 week follow-up. He developed new wounds to the right foot that were evaluated at last clinic visit. He continues to have a total contact cast to the left leg and he reports no issues. He has been using silver collagen to the right foot wounds with no issues. He denies purulent drainage, fever/chills or erythema to the right foot wounds. He has no complaints today 4/25; patient presents for 1 week follow-up. He has a total contact cast of the left leg and reports no issues. He has been using silver alginate to the right foot wound. He denies purulent drainage, fever/chills or erythema to the right foot wounds. 5/2 patient presents for 1 week follow-up. T contact cast on the left. The wound which is on the base of the plantar foot at the base of the fifth metatarsal otal actually looks quite good and dimensions continue to gradually contract. HOWEVER the area on the right lateral fifth metatarsal  head is much larger than what I remember from 2 weeks ago. Once more is he has significant levels of hypergranulation. Noteworthy that he had this same hyper granulated response on his wound on the left foot at one point in time. So much so that he I thought there was an underlying fluid collection. Based on this I think this just needs debridement. 5/9; the wound on the left actually continues to be gradually smaller  with a healthy surface. Slight amount of drainage and maceration of the skin around but not too bad. However he has a large wound over the right fifth metatarsal head very much in the same configuration as his left foot wound was initially. I used silver nitrate to address the hyper granulated tissue no mechanical debridement 5/16; area on the left foot did not look as healthy this week deeper thick surrounding macerated skin and subcutaneous tissue. The area on the right foot fifth met head was about the same The area on the right ankle that we identified last week is completely broken down into an open wound presumably a stocking rubbing issue 5/23; patient has been using a total contact cast to the left side. He has been using silver alginate underneath. He has also been using silver alginate to the right foot wounds. He has no complaints today. He denies any signs of infection. 5/31; the left-sided wound looks some better measure smaller surface granulation looks better. We have been using silver alginate under the total contact cast The large area on his right fifth met head and right dorsal foot look about the same still using silver alginate 6/6; neither side is good as I was hoping although the surface area dimensions are better. A lot of maceration on his left and right foot around the wound edge. Area on the dorsal right foot looks better. He says he was traveling. I am not sure what does the amount of maceration around the plantar wounds may be drainage issues 6/13; in general the wound surfaces look quite good on both sides. Macerated skin and raised edges around the wound required debridement although in general especially on the left the surface area seems improved. The area on the right dorsal ankle is about the same I thought this would not be such a problem to close 6/20; not much change in either wound although the one on the right looks a little better. Both wounds have thick  macerated edges to the skin requiring debridements. We have been using silver alginate. The area on the dorsal right ankle is still open I thought this would be closed. 6/28; patient comes in today with a marked deterioration in the right foot wound fifth met head. Wide area of exposed bone this is a drastic change from last time. The area on the left there we have been casting is stagnant. We have been using silver alginate in both wound areas. 7/5; bone culture I did for PCR last time was positive for Pseudomonas, group B strep, Enterococcus and Staph aureus. There was no suggestion of methicillin resistance or ampicillin resistant genes. This was resistant to tetracycline however He comes into the clinic today with the area over his right plantar fifth metatarsal head which had been doing so well 2 weeks ago completely necrotic feeling bone. I do not know that this is going to be salvageable. The left foot wound is certainly no smaller but it has a better surface and is superficial. 7/8; patient called in this morning to  say that his total contact cast was rubbing against his foot. He states he is doing fine overall. He denies signs of infection. 7/12; continued deterioration in the wound over the right fifth metatarsal head crumbling bone. This is not going to be salvageable. The patient agrees and wants to be referred to Dr. Doran Durand which we will attempt to arrange as soon as possible. I am going to continue him on antibiotics as long as that takes so I will renew those today. The area on the left foot which is the base of the fifth metatarsal continues to look somewhat better. Healthy looking tissue no depth no debridement is necessary here. 7/20; the patient was kindly seen by Dr. Doran Durand of orthopedics on 10/19/2020. 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 Towards this end we have taken him out of  the total contact cast on the left we will put him in a wrap with Hydrofera Blue. As I understand things surgery is planned for 7/21 7/27; patient had his surgery last Thursday. 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 Apparently everything went well we did not still disturb that today The area on the left foot actually looks quite good. He has been much less mobile which probably explains this he did not seem to do well in the total contact cast secondary to drainage and maceration I think. 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 Fortunately there is no evidence of active infection at this time. No fevers, chills, nausea, vomiting, or diarrhea. Overall I think that he is actually doing extremely well. Nonetheless I do believe that he is staying off of this more following the surgery in his right foot that is the reason the left is doing so great. 8/16; left plantar foot wound. This looks smaller than the last time I saw this he is using Hydrofera Blue. The surgical wound on the right foot is being followed by Dr. Doran Durand we did not look at this today. He has surgical shoes on both feet 8/23; left plantar foot wound not as good this week. Surrounding macerated skin and subcutaneous tissue everything looks moist and wet. I do not think he is offloading this adequately. He is using a surgical shoe Apparently the right foot surgical wound is not open although I did not check his foot 8/31; left plantar foot lateral aspect. Much improved this week. He has no maceration. Some improvement in the surface area of the wound but most impressively the depth is come in we are using silver alginate. The patient is a Product/process development scientist. He is asked that we write him a letter so he can go back to work. I have also tried to see if we can write something that will allow him to limit the amount of time  that he is on his foot at work. 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 Hopefully the latter can be avoided 9/6; I believe the patient missed an appointment last week. He arrives in today with a wound looking roughly the same certainly no better. Undermining laterally and also inferiorly. We used molecuLight today in training with the patient's permission.. We are using silver alginate 9/21  wound is measuring bigger this week although this may have to do with the aggressive circumferential debridement last week in response to the blush fluorescence on the MolecuLight. Culture I did last week showed significant MSSA and E. coli. I put him on Augmentin but he has not started it yet. We are also going to send this for compounded antibiotics at [facility]. There is no evidence of systemic infection 9/29; silver alginate. His Keystone arrived. He is completing Augmentin in 2 days. Offloading in a cam boot. Moderate drainage per our intake staff 10/5; using silver alginate. He has been using his [place]. He has completed his Augmentin. Per our intake nurse still a lot of drainage, far too much to consider a total contact cast. Wound measures about the same. He had the same undermining area that I defined last week from a roughly 11-3. I remove this today 10/12; using silver alginate he is using the [place]. He comes in for a nurse visit hence we are applying [employer] twice a week. Measuring slightly better today and less notable drainage. Extensive debridement of the wound edge last time 10/18; using topical Keystone and silver alginate and a soft cast. Wound measurements about the same. Drainage was through his soft cast. We are changing this twice a week Tuesdays and Friday 10/25; comes in with moderate drainage. Still using Keystone silver alginate and a soft cast. Wound dimensions completely the same.He has a lot of edema  in the left leg he has lymphedema. Asking for [name] to consider wrapping him as he cannot get his stocking on over the soft cast 11/2; comes in with moderate to large drainage slightly smaller in terms of width we have been using [place]. His wound looks satisfactory but not much improvement 11/4; patient presents today for obligatory cast change. Has no issues or complaints today. He denies signs of infection. 11/9; patient traveled this weekend to [place], was on the cast quite a bit. Staining of the cast with black material from his walking boot. Drainage was not quite as bad as we feared. Using silver alginate and Keystone 11/16; we do not have size for cast therefore we have been putting a soft cast on him since the change on Friday. Still a significant amount of drainage necessitating changing twice a week. We have been using the Keystone at cast changes either hard or soft as well as silver alginate Comes in the clinic with things actually looking fairly good improvement in width. He says his offloading is about the same 02/24/2021 upon evaluation today patient actually comes back in and is doing excellent in regard to his foot ulcer this is significantly smaller even compared to the last visit. The soft cast seems to have done extremely well for him which is great news. I do not see any signs of infection minimal debridement will be needed today. 11/30; left lateral foot much improved half a centimeter improvement in surface area. No evidence of infection. He seems to be doing better with the soft cast in the TCC therefore we will continue with this. He comes back in later in the week for a change with the nurses. This is due to drainage 12/6; no improvement in dimensions. Under illumination some debris on the surface we have been using silver alginate, soft cast. If there is anything optimistic here he seems to have have less drainage Electronic Signature(s) Signed: 03/09/2021 4:21:48 PM By:  [name] MD Entered By: [name] on 03/09/2021 08:29:29 -------------------------------------------------------------------------------- Soft Cast Details  Patient Name: Date of Service: [name] 03/09/2021 7:30 A M Medical Record Number: [id number] Patient Account Number: [id number] Date of Birth/Sex: Treating RN: Apr 21, 1985 (35 y.o. [name]: [name] Other Clinician: Referring Provider: Treating Provider/Extender: [name] in Treatment: 56 Procedure Performed for: Wound #3 Left,Lateral Foot Performed By: Clinician [name], RN Post Procedure Diagnosis Same as Pre-procedure Electronic Signature(s) Signed: 03/09/2021 4:21:48 PM By: [name] MD Entered By: [name] on 03/09/2021 08:28:50 -------------------------------------------------------------------------------- Physical Exam Details Patient Name: Date of Service: [name], [name] 03/09/2021 7:30 A M Medical Record Number: [id number] Patient Account Number: [id number] Date of Birth/Sex: Treating RN: 1985-06-30 (35 y.o. [name]: [name] Other Clinician: Referring Provider: Treating Provider/Extender: [name] in Treatment: 71 Constitutional Sitting or standing Blood Pressure is within target range for patient.. Pulse regular and within target range for patient.Marland Kitchen Respirations regular, non-labored and within target range.. Temperature is normal and within the target range for the patient.Marland Kitchen Appears in no distress. Notes Wound exam; the wound has not deteriorated but no improvement in dimensions. Under illumination there is a fibrinous surface which I used a #5 curette to debride. Hemostasis with direct pressure. Otherwise the surroundings of this wound look reasonably healthy Electronic Signature(s) Signed: 03/09/2021 4:21:48 PM By: [name]  MD Entered By: [name] on 03/09/2021 08:30:46 -------------------------------------------------------------------------------- Physician Orders Details Patient Name: Date of Service: [name], [name] 03/09/2021 7:30 A M Medical Record Number: [id number] Patient Account Number: [id number] Date of Birth/Sex: Treating RN: [birth year] (35 y.o. [name] Primary Care Provider: [name] Other Clinician: Referring Provider: Treating Provider/Extender: [name] in Treatment: 70 Verbal / Phone Orders: No Diagnosis Coding ICD-10 Coding Code Description E11.621 Type 2 diabetes mellitus with foot ulcer L97.528 Non-pressure chronic ulcer of other part of left foot with other specified severity Follow-up Appointments ppointment in 1 week. - Dr. Dellia Nims Return A Nurse Visit: - Friday 03/12/21 to change soft cast Bathing/ Shower/ Hygiene May shower with protection but do not get wound dressing(s) wet. Edema Control - Lymphedema / SCD / Other Bilateral Lower Extremities Elevate legs to the level of the heart or above for 30 minutes daily and/or when sitting, a frequency of: - throughout the day Avoid standing for long periods of time. Exercise regularly Moisturize legs daily. - right leg every night before bed. Compression stocking or Garment 20-30 mm/Hg pressure to: - Apply to right leg in the morning and remove at night. Off-Loading Other: - minimal weight bearing left foot Other: - Soft cast to left foot - wrap leg with kerlix/coban to reduce swelling Additional Orders / Instructions Follow Nutritious Diet Wound Treatment Wound #3 - Foot Wound Laterality: Left, Lateral Cleanser: Soap and Water 2 x Per Week/30 Days Discharge Instructions: May shower and wash wound with dial antibacterial soap and water prior to dressing change. Cleanser: Wound Cleanser (Generic) 2 x Per Week/30 Days Discharge Instructions: Cleanse the wound with wound  cleanser prior to applying a clean dressing using gauze sponges, not tissue or cotton balls. Peri-Wound Care: Zinc Oxide Ointment 30g tube 2 x Per Week/30 Days Discharge Instructions: Apply Zinc Oxide to periwound with each dressing change as needed. Topical: keystone antibiotic compound 2 x Per Week/30 Days Discharge Instructions: thin layer to wound bed Prim Dressing: KerraCel Ag Gelling Fiber Dressing, 4x5 in (silver alginate) (Generic) 2 x Per Week/30 Days ary Discharge  Instructions: Apply silver alginate to wound bed as instructed Secondary Dressing: ABD Pad, 8x10 2 x Per Week/30 Days Discharge Instructions: Apply over primary dressing as directed. Secondary Dressing: Zetuvit Plus 4x8 in (Generic) 2 x Per Week/30 Days Discharge Instructions: Apply over primary dressing as directed. Compression Wrap: Kerlix Roll 4.5x3.1 (in/yd) 2 x Per Week/30 Days Discharge Instructions: Apply Kerlix and Coban compression as directed. Compression Wrap: Coban Self-Adherent Wrap 4x5 (in/yd) 2 x Per Week/30 Days Discharge Instructions: Apply over Kerlix as directed. Electronic Signature(s) Signed: 03/09/2021 3:40:48 PM By: [name] Signed: 03/09/2021 4:21:48 PM By: [name] MD Entered By: [name] on 03/09/2021 08:20:04 -------------------------------------------------------------------------------- Problem List Details Patient Name: Date of Service: [name], [name] 03/09/2021 7:30 A M Medical Record Number: [id number] Patient Account Number: [id number] Date of Birth/Sex: Treating RN: [birth year] (35 y.o. [name] Primary Care Provider: [name] Other Clinician: Referring Provider: Treating Provider/Extender: [name] in Treatment: 1 Active Problems ICD-10 Encounter Code Description Active Date MDM Diagnosis E11.621 Type 2 diabetes mellitus with foot ulcer 10/24/2019 No Yes L97.528 Non-pressure chronic ulcer of other part of left  foot with other specified 10/24/2019 No Yes severity Inactive Problems ICD-10 Code Description Active Date Inactive Date L97.518 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ity 10/24/2019 10/24/2019 L97.518 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ity 07/14/2020 07/14/2020 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, right ankle and foot 10/24/2019 10/24/2019 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 08/10/2020 08/10/2020 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, left ankle and foot 10/24/2019 10/24/2019 B95.62 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 10/24/2019 10/24/2019 classified elsewhere Resolved Problems Electronic Signature(s) Signed: 03/09/2021 4:21:48 PM By: [name] MD Entered By: [name] on 03/09/2021 08:28:15 -------------------------------------------------------------------------------- Progress Note Details Patient Name: Date of Service: [name], [name] 03/09/2021 7:30 A M Medical Record Number: [id number] Patient Account Number: [id number] Date of Birth/Sex: Treating RN: [birth year] (35 y.o. 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 He comes in for review of a wound over the plantar fifth metatarsal head extending into the lateral part of the foot. He was followed for this previously by his podiatrist Dr. Cornelius Moras. As the patient tells his story he went to see podiatry first for a swelling he developed on the lateral part of his fifth metatarsal head in May. He states this was "open" by podiatry and the area closed. He was followed up in  June and it was again opened callus removed and it closed promptly. There were plans being made for surgery on the fifth metatarsal head in June however his blood sugar was apparently too high for anesthesia. Apparently the area was debrided and opened again in June and it is never closed since. Looking over the records from podiatry I am really not able to follow this. It was clear when he was first seen it was before 5/14 at that point he already had a wound. By 5/17 the ulcer was resolved. I do not see anything about a procedure. On 5/28 noted to have pre-ulcerative moderate keratosis. X-ray noted 1/5 contracted toe and tailor's bunion and metatarsal deformity. On a visit date on 09/28/2018 the dorsal part of  the left foot it healed and resolved. There was concern about swelling in his lower extremity he was sent to the ER.. As far as I can tell he was seen in the ER on 7/12 with an ulcer on his left foot. A DVT rule out of the left leg was negative. I do not think I have complete records from podiatry but I am not able to verify the procedures this patient states he had. He states after the last procedure the wound has never closed although I am not able to follow this in the records I have from podiatry. He has not had a recent x-ray The patient has been using Neosporin on the wound. He is wearing a Darco shoe. He is still very active up on his foot working and exercising. Past medical history; type 2 diabetes ketosis-prone, leg swelling with a negative DVT study in July. Non-smoker ABI in our clinic was 0.85 on the left 10/16; substantial wound on the plantar left fifth met head extending laterally almost to the dorsal fifth MTP. We have been using silver alginate we gave him a Darco forefoot off loader. An x-ray did not show evidence of osteomyelitis did note soft tissue emphysema which I think was due to gas tracking through an open wound. There is no doubt in my mind he requires an MRI 10/23;  MRI not booked until 3 November at the earliest this is largely due to his glucose sensor in the right arm. We have been using silver alginate. There has been an improvement 10/29; I am still not exactly sure when his MRI is booked for. He says it is the third but it is the 10th in epic. This definitely needs to be done. He is running a low-grade fever today but no other symptoms. No real improvement in the 1 02/26/2019 patient presents today for a follow-up visit here in our clinic he is last been seen in the clinic on October 29. Subsequently we were working on getting MRI to evaluate and see what exactly was going on and where we would need to go from the standpoint of whether or not he had osteomyelitis and again what treatments were going be required. Subsequently the patient ended up being admitted to the hospital on 02/07/2019 and was discharged on 02/14/2019. This is a somewhat interesting admission with a discharge diagnosis of pneumonia due to COVID-19 although he was positive for COVID-19 when tested at the urgent care but negative x2 when he was actually in the hospital. With that being said he did have acute respiratory failure with hypoxia and it was noted he also have a left foot ulceration with osteomyelitis. With that being said he did require oxygen for his pneumonia and I level 4 L. He was placed on antivirals and steroids for the COVID-19. He was also transferred to the [facility] at one point. Nonetheless he did subsequently discharged home and since being home has done much better in that regard. The CT angiogram did not show any pulmonary embolism. With regard to the osteomyelitis the patient was placed on vancomycin and Zosyn while in the hospital but has been changed to Augmentin at discharge. It was also recommended that he follow- up with wound care and podiatry. Podiatry however wanted him to see [name] according to the patient prior to them doing anything further. His  hemoglobin A1c was 9.9 as noted in the hospital. Have an MRI of the left foot performed while in the hospital on  02/04/2019. 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 There is an overlying plantar open wound noted an abscess tracking back along the lateral aspect of the fifth metatarsal shaft. There is otherwise diffuse cellulitis and mild fasciitis without findings of polymyositis. The patient did have recently pneumonia secondary to COVID-19 I looked in the chart through epic and it does appear that the patient may need to have an additional x-ray just to ensure everything is cleared and that he has no airspace disease prior to putting him into the chamber. 03/05/2019; patient was readmitted to the clinic last week. He was hospitalized twice for a viral upper respiratory tract infection from 11/1 through 11/4 and then 11/5 through 11/12 ultimately this turned out to be Covid pneumonitis. Although he was discharged on oxygen he is not using it. He says he feels fine. He has no exercise limitation no cough no sputum. His O2 sat in our clinic today was 100% on room air. 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 He received Vanco and Zosyn in the hospital and then was discharged on 2 weeks of Augmentin. I do not see any relevant cultures. He was supposed to follow-up with infectious disease but I do not see that he has an appointment. 12/8; patient saw Dr. Novella Olive of infectious disease last week. He felt that he had had adequate antibiotic therapy. He did not go to follow-up with Dr. Amalia Hailey of podiatry and I have again talked to him about the pros and cons of this. He does not want to consider a ray amputation of this time. He is aware of the risks of recurrence, migration etc. He started HBO  today and tolerated this well. He can complete the Augmentin that I gave him last week. I have looked over the lab work that Dr. Chana Bode ordered his C-reactive protein was 3.3 and his sedimentation rate was 17. The C-reactive protein is never really been measurably that high in this patient 12/15; not much change in the wound today however he has undermining along the lateral part of the foot again more extensively than last week. He has some rims of epithelialization. We have been using silver alginate. He is undergoing hyperbarics but did not dive today 12/18; in for his obligatory first total contact cast change. Unfortunately there was pus coming from the undermining area around his fifth metatarsal head. This was cultured but will preclude reapplication of a cast. He is seen in conjunction with HBO 12/24; patient had staph lugdunensis in the wound in the undermining area laterally last time. We put him on doxycycline which should have covered this. The wound looks better today. I am going to give him another week of doxycycline before reattempting the total contact cast 12/31; the patient is completing antibiotics. Hemorrhagic debris in the distal part of the wound with some undermining distally. He also had hyper granulation. Extensive debridement with a #5 curette. The infected area that was on the lateral part of the fifth met head is closed over. I do not think he needs any more antibiotics. Patient was seen prior to HBO. Preparations for a total contact cast were made in the cast will be placed post hyperbarics 04/11/19; once again the patient arrives today without complaint. He had been in a cast all week noted that he had  heavy drainage this week. This resulted in large raised areas of macerated tissue around the wound 1/14; wound bed looks better slightly smaller. Hydrofera Blue has been changing himself. He had a heavy drainage last week which caused a lot of maceration around the wound so  I took him out of a total contact cast he says the drainage is actually better this week He is seen today in conjunction with HBO 1/21; returns to clinic. He was up in [place] for a day or 2 attending a funeral. He comes back in with the wound larger and with a large area of exposed bone. He had osteomyelitis and septic arthritis of the fifth left metatarsal head while he was in hospital. He received IV antibiotics in the hospital for a prolonged period of time then 3 weeks of Augmentin. Subsequently I gave him 2 weeks of doxycycline for more superficial wound infection. When I saw this last week the wound was smaller the surface of the wound looks satisfactory. 1/28; patient missed hyperbarics today. Bone biopsy I did last time showed Enterococcus faecalis and Staphylococcus lugdunensis . He has a wide area of exposed bone. We are going to use silver alginate as of today. I had another ethical discussion with the patient. 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 In this situation I think the likelihood of healing this is low. Therefore I have recommended a ray amputation and with the patient's agreement I have referred him to Dr. Doran Durand. The other issue is that his compliance with hyperbarics has been minimal because of his work schedule and given his underlying decision I am going to stop this today READMISSION 10/24/2019 MRI 09/29/2019 left foot IMPRESSION: 1. Apparent skin ulceration inferior and lateral to the 5th metatarsal base with underlying heterogeneous T2 signal and enhancement in the subcutaneous fat. Small peripherally enhancing fluid collections along the plantar and lateral aspects of the 5th metatarsal base suspicious for abscesses. 2. Interval amputation through the mid 5th metatarsal with nonspecific low-level marrow edema and enhancement. Given the proximity to the adjacent soft tissue inflammatory changes, osteomyelitis cannot be  excluded. 3. The additional bones appear unremarkable. MRI 09/29/2019 right foot IMPRESSION: 1. Soft tissue ulceration lateral to the 5th MTP joint. There is low-level T2 hyperintensity within the 4th and 5th metatarsal heads and adjacent proximal phalanges without abnormal T1 signal or cortical destruction. These findings are nonspecific and could be seen with early marrow edema, hyperemia or early osteomyelitis. No evidence of septic joint. 2. Mild tenosynovitis and synovial enhancement associated with the extensor digitorum tendons at the level of the midfoot. 3. Diffuse low-level muscular T2 hyperintensity and enhancement, most consistent with diabetic myopathy. LEFT FOOT BONE Methicillin resistant staphylococcus aureus Staphylococcus lugdunensis MIC MIC CIPROFLOXACIN >=8 RESISTANT Resistant <=0.5 SENSI... Sensitive CLINDAMYCIN <=0.25 SENS... Sensitive >=8 RESISTANT Resistant ERYTHROMYCIN >=8 RESISTANT Resistant >=8 RESISTANT Resistant GENTAMICIN <=0.5 SENSI... Sensitive <=0.5 SENSI... Sensitive Inducible Clindamycin NEGATIVE Sensitive NEGATIVE Sensitive OXACILLIN >=4 RESISTANT Resistant 2 SENSITIVE Sensitive RIFAMPIN <=0.5 SENSI... Sensitive <=0.5 SENSI... Sensitive TETRACYCLINE <=1 SENSITIVE Sensitive <=1 SENSITIVE Sensitive TRIMETH/SULFA <=10 SENSIT Sensitive <=10 SENSIT Sensitive ... Marland Kitchen.. VANCOMYCIN 1 SENSITIVE Sensitive <=0.5 SENSI... Sensitive Right foot bone . Component 3 wk ago Specimen Description BONE Special Requests RIGHT 4 METATARSAL SAMPLE B Gram Stain NO WBC SEEN NO ORGANISMS SEEN Culture RARE METHICILLIN RESISTANT STAPHYLOCOCCUS AUREUS NO ANAEROBES ISOLATED Performed at [facility], [place] [address], [place], [place] [address] Report Status 10/08/2019 FINAL Organism ID, Bacteria METHICILLIN RESISTANT  STAPHYLOCOCCUS AUREUS Resulting Agency [facility] Susceptibility Methicillin resistant staphylococcus aureus MIC CIPROFLOXACIN >=8 RESISTANT  Resistant CLINDAMYCIN <=0.25 SENS... Sensitive ERYTHROMYCIN >=8 RESISTANT Resistant GENTAMICIN <=0.5 SENSI... Sensitive Inducible Clindamycin NEGATIVE Sensitive OXACILLIN >=4 RESISTANT Resistant RIFAMPIN <=0.5 SENSI... Sensitive TETRACYCLINE <=1 SENSITIVE Sensitive TRIMETH/SULFA <=10 SENSIT Sensitive ... VANCOMYCIN 1 SENSITIVE Sensitive This is a patient we had in clinic earlier this year with a wound over his left fifth metatarsal head. 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 Doran Durand on 05/16/2019. He seems to have developed problems with wounds on his bilateral feet in June 2021 although he may have had problems earlier than this. He was in an urgent care with a right foot ulcer on 09/26/2019 and given a course of doxycycline. This was apparently after having trouble getting into see orthopedics. He was seen by podiatry on 09/28/2019 noted to have bilateral lower extremity ulcers including the left lateral fifth metatarsal base and the right subfifth met head. It was noted that had purulent drainage at that time. He required hospitalization from 6/20 through 7/2. This was because of worsening right foot wounds. He underwent bilateral operative incision and drainage and bone biopsies bilaterally. Culture results are listed above. He has been referred back to clinic by Dr. Jacqualyn Posey of podiatry. He is also followed by Dr. Megan Salon who saw him yesterday. He was discharged from hospital on Zyvox Flagyl and Levaquin and yesterday changed to doxycycline Flagyl and Levaquin. His inflammatory markers on 6/26 showed a sedimentation rate of 129 and a C-reactive protein of 5. This is improved to 14 and 1.3 respectively. This  would indicate improvement. ABIs in our clinic today were 1.23 on the right and 1.20 on the left 11/01/2019 on evaluation today patient appears to be doing fairly well in regard to the wounds on his feet at this point. Fortunately there is no signs of active infection at this time. No fevers, chills, nausea, vomiting, or diarrhea. He currently is seeing infectious disease and still under their care at this point. Subsequently he also has both wounds which she has not been using collagen on as he did not receive that in his packaging he did not call us and let us know that. Apparently that just was missed on the order. Nonetheless we will get that straightened out today. 8/9-Patient returns for bilateral foot wounds, using Prisma with hydrogel moistened dressings, and the wounds appear stable. Patient using surgical shoes, avoiding much pressure or weightbearing as much as possible 8/16; patient has bilateral foot wounds. 1 on the right lateral foot proximally the other is on the left mid lateral foot. Both required debridement of callus and thick skin around the wounds. We have been using silver collagen 8/27; patient has bilateral lateral foot wounds. The area on the left substantially surrounded by callus and dry skin. This was removed from the wound edge. The underlying wound is small. The area on the right measured somewhat smaller today. 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 Unfortunately I did not update his antibiotics during today's visit. 9/10 I reviewed Dr. Hale Bogus last notes he felt he had completed antibiotics his inflammatory markers were reasonably well controlled. He has a small wound  on the lateral left foot and a tiny area on the right which is just above closed. He is using Hydrofera Blue with border foam he has bilateral surgical shoes 9/24; 2 week f/u. doing well. right foot is closed. left foot still  undermined. 10/14; right foot remains closed at the fifth met head. The area over the base of the left fifth metatarsal has a small open area but considerable undermining towards the plantar foot. Thick callus skin around this suggests an adequate pressure relief. We have talked about this. He says he is going to go back into his cam boot. I suggested a total contact cast he did not seem enamored with this suggestion 10/26; left foot base of the fifth metatarsal. Same condition as last time. He has skin over the area with an open wound however the skin is not adherent. He went to see Dr. Earleen Newport who did an x-ray and culture of his foot I have not reviewed the x-ray but the patient was not told anything. He is on doxycycline 11/11; since the patient was last here he was in the emergency room on 10/30 he was concerned about swelling in the left foot. They did not do any cultures or x-rays. They changed his antibiotics to cephalexin. Previous culture showed group B strep. The cephalexin is appropriate as doxycycline has less than predictable coverage. Arrives in clinic today with swelling over this area under the wound. He also has a new wound on the right fifth metatarsal head 11/18; the patient has a difficult wound on the lateral aspect of the left fifth metatarsal head. The wound was almost ballotable last week I opened it slightly expecting to see purulence however there was just bleeding. I cultured this this was negative. X-ray unchanged. We are trying to get an MRI but I am not sure were going to be able to get this through his insurance. He also has an area on the right lateral fifth metatarsal head this looks healthier 12/3; the patient finally got our MRI. Surprisingly this did not show osteomyelitis. I did show the soft tissue ulceration at the lateral plantar aspect of the fifth metatarsal base with a tiny residual 6 mm abscess overlying the superficial fascia I have tried to culture this area  I have not been able to get this to grow anything. Nevertheless the protruding tissue looks aggravated. I suspect we should try to treat the underlying "abscess with broad-spectrum antibiotics. I am going to start him on Levaquin and Flagyl. He has much less edema in his legs and I am going to continue to wrap his legs and see him weekly 12/10. I started Levaquin and Flagyl on him last week. He just picked up the Flagyl apparently there was some delay. The worry is the wound on the left fifth metatarsal base which is substantial and worsening. His foot looks like he inverts at the ankle making this a weightbearing surface. Certainly no improvement in fact I think the measurements of this are somewhat worse. We have been using 12/17; he apparently just got the Levaquin yesterday this is 2 weeks after the fact. He has completed the Flagyl. The area over the left fifth metatarsal base still has protruding granulation tissue although it does not look quite as bad as it did some weeks ago. He has severe bilateral lymphedema although we have not been treating him for wounds on his legs this is definitely going to require compression. There was so much edema in the left  I did not wish to put him in a total contact cast today. I am going to increase his compression from 3-4 layer. The area on the right lateral fifth met head actually look quite good and superficial. 12/23; patient arrived with callus on the right fifth met head and the substantial hyper granulated callused wound on the base of his fifth metatarsal. He says he is completing his Levaquin in 2 days but I do not think that adds up with what I gave him but I will have to double check this. We are using Hydrofera Blue on both areas. My plan is to put the left leg in a cast the week after New Year's 04/06/2020; patient's wounds about the same. Right lateral fifth metatarsal head and left lateral foot over the base of the fifth metatarsal. There is  undermining on the left lateral foot which I removed before application of total contact cast continuing with Hydrofera Blue new. Patient tells me he was seen by endocrinology today lab work was done [Dr. Kerr]. Also wondering whether he was referred to cardiology. I went over some lab work from previously does not have chronic renal failure certainly not nephrotic range proteinuria he does have very poorly controlled diabetes but this is not his most updated lab work. Hemoglobin A1c has been over 11 1/10; the patient had a considerable amount of leakage towards mid part of his left foot with macerated skin however the wound surface looks better the area on the right lateral fifth met head is better as well. I am going to change the dressing on the left foot under the total contact cast to silver alginate, continue with Hydrofera Blue on the right. 1/20; patient was in the total contact cast for 10 days. Considerable amount of drainage although the skin around the wound does not look too bad on the left foot. The area on the right fifth metatarsal head is closed. Our nursing staff reports large amount of drainage out of the left lateral foot wound 1/25; continues with copious amounts of drainage described by our intake staff. PCR culture I did last week showed E. coli and Enterococcus faecalis and low quantities. Multiple resistance genes documented including extended spectrum beta lactamase, MRSA, MRSE, quinolone, tetracycline. The wound is not quite as good this week as it was 5 days ago but about the same size 2/3; continues with copious amounts of malodorous drainage per our intake nurse. The PCR culture I did 2 weeks ago showed E. coli and low quantities of Enterococcus. There were multiple resistance genes detected. I put Neosporin on him last week although this does not seem to have helped. The wound is slightly deeper today. Offloading continues to be an issue here although with the amount of  drainage she has a total contact cast is just not going to work 2/10; moderate amount of drainage. Patient reports he cannot get his stocking on over the dressing. I told him we have to do that the nurse gave him suggestions on how to make this work. The wound is on the bottom and lateral part of his left foot. Is cultured predominantly grew low amounts of Enterococcus, E. coli and anaerobes. There were multiple resistance genes detected including extended spectrum beta lactamase, quinolone, tetracycline. I could not think of an easy oral combination to address this so for now I am going to do topical antibiotics provided by [facility] I think the main agents here are vancomycin and an aminoglycoside. We have to be  able to give him access to the wounds to get the topical antibiotic on 2/17; moderate amount of drainage this is unchanged. He has his Keystone topical antibiotic against the deep tissue culture organisms. He has been using this and changing the dressing daily. Silver alginate on the wound surface. 2/24; using Keystone antibiotic with silver alginate on the top. He had too much drainage for a total contact cast at one point although I think that is improving and I think in the next week or 2 it might be possible to replace a total contact cast I did not do this today. In general the wound surface looks healthy however he continues to have thick rims of skin and subcutaneous tissue around the wide area of the circumference which I debrided 06/04/2020 upon evaluation today patient appears to be doing well in regard to his wound. I do feel like he is showing signs of improvement. There is little bit of callus and dead tissue around the edges of the wound as well as what appears to be a little bit of a sinus tract that is off to the side laterally I would perform debridement to clear that away today. 3/17; left lateral foot. The wound looks about the same as I remember. Not much depth surface looks  healthy. No evidence of infection 3/25; left lateral foot. Wound surface looks about the same. Separating epithelium from the circumference. There really is no evidence of infection here however not making progress by my view 3/29; left lateral foot. Surface of the wound again looks reasonably healthy still thick skin and subcutaneous tissue around the wound margins. There is no evidence of infection. One of the concerns being brought up by the nurses has again the amount of drainage vis--vis continued use of a total contact cast 4/5; left lateral foot at roughly the base of the fifth metatarsal. Nice healthy looking granulated tissue with rims of epithelialization. The overall wound measurements are not any better but the tissue looks healthy. The only concern is the amount of drainage although he has no surrounding maceration with what we have been doing recently to absorb fluid and protect his skin. He also has lymphedema. 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 He states that the kerlix/coban under the TCC rolled down and could not get it back up. He has been using an offloading scooter and has somehow hurt his right foot using this device. This happened last week. He states that the side of his right foot developed a blister and opened. The top of his foot also has a few small open wounds he thinks is due to his socks rubbing in his shoes. He has not been using any dressings to the wound. He denies purulent drainage, fever/chills or erythema to the wounds. 4/22; patient presents for 1 week follow-up. He developed new wounds to the right foot that were evaluated at last clinic visit. He continues to have a total contact cast to the left leg and he  reports no issues. He has been using silver collagen to the right foot wounds with no issues. He denies purulent drainage, fever/chills or erythema to the right foot wounds. He has no complaints today 4/25; patient presents for 1 week  follow-up. He has a total contact cast of the left leg and reports no issues. He has been using silver alginate to the right foot wound. He denies purulent drainage, fever/chills or erythema to the right foot wounds. 5/2 patient presents for 1 week follow-up. T contact cast on the left. The wound which is on the base of the plantar foot at the base of the fifth metatarsal otal actually looks quite good and dimensions continue to gradually contract. HOWEVER the area on the right lateral fifth metatarsal head is much larger than what I remember from 2 weeks ago. Once more is he has significant levels of hypergranulation. Noteworthy that he had this same hyper granulated response on his wound on the left foot at one point in time. So much so that he I thought there was an underlying fluid collection. Based on this I think this just needs debridement. 5/9; the wound on the left actually continues to be gradually smaller with a healthy surface. Slight amount of drainage and maceration of the skin around but not too bad. However he has a large wound over the right fifth metatarsal head very much in the same configuration as his left foot wound was initially. I used silver nitrate to address the hyper granulated tissue no mechanical debridement 5/16; area on the left foot did not look as healthy this week deeper thick surrounding macerated skin and subcutaneous tissue. oo The area on the right foot fifth met head was about the same oo The area on the right ankle that we identified last week is completely broken down into an open wound presumably a stocking rubbing issue 5/23; patient has been using a total contact cast to the left side. He has been using silver alginate  underneath. He has also been using silver alginate to the right foot wounds. He has no complaints today. He denies any signs of infection. 5/31; the left-sided wound looks some better measure smaller surface granulation looks better. We have been using silver alginate under the total contact cast oo The large area on his right fifth met head and right dorsal foot look about the same still using silver alginate 6/6; neither side is good as I was hoping although the surface area dimensions are better. A lot of maceration on his left and right foot around the wound edge. Area on the dorsal right foot looks better. He says he was traveling. I am not sure what does the amount of maceration around the plantar wounds may be drainage issues 6/13; in general the wound surfaces look quite good on both sides. Macerated skin and raised edges around the wound required debridement although in general especially on the left the surface area seems improved. oo The area on the right dorsal ankle is about the same I thought this would not be such a problem to close 6/20; not much change in either wound although the one on the right looks a little better. Both wounds have thick macerated edges to the skin requiring debridements. We have been using silver alginate. The area on the dorsal right ankle is still open I thought this would be closed. 6/28; patient comes in today with a marked deterioration in the right foot wound fifth met head. Wide area of exposed bone this is a drastic change from last time. The area on the left there we have been casting is stagnant. We have been using silver alginate in both wound areas. 7/5; bone culture I did for PCR last time  was positive for Pseudomonas, group B strep, Enterococcus and Staph aureus. There was no suggestion of methicillin resistance or ampicillin resistant genes. This was resistant to tetracycline however He comes into the clinic today with the area over his right  plantar fifth metatarsal head which had been doing so well 2 weeks ago completely necrotic feeling bone. I do not know that this is going to be salvageable. The left foot wound is certainly no smaller but it has a better surface and is superficial. 7/8; patient called in this morning to say that his total contact cast was rubbing against his foot. He states he is doing fine overall. He denies signs of infection. 7/12; continued deterioration in the wound over the right fifth metatarsal head crumbling bone. This is not going to be salvageable. The patient agrees and wants to be referred to Dr. Doran Durand which we will attempt to arrange as soon as possible. I am going to continue him on antibiotics as long as that takes so I will renew those today. The area on the left foot which is the base of the fifth metatarsal continues to look somewhat better. Healthy looking tissue no depth no debridement is necessary here. 7/20; the patient was kindly seen by Dr. Doran Durand of orthopedics on 10/19/2020. 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 Towards this end we have taken him out of the total contact cast on the left we will put him in a wrap with Hydrofera Blue. As I understand things surgery is planned for 7/21 7/27; patient had his surgery last Thursday. 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 Apparently everything went well we did not still disturb that today The area on the left foot actually looks quite good. He has been much less mobile which probably explains this he did not seem to do well in the total contact cast secondary to drainage and maceration I think. 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 Fortunately there is no evidence of active infection at this time. No fevers, chills, nausea, vomiting,  or diarrhea. Overall I think that he is actually doing extremely well. Nonetheless I do believe that he is staying off of this more following the surgery in his right foot that is the reason the left is doing so great. 8/16; left plantar foot wound. This looks smaller than the last time I saw this he is using Hydrofera Blue. The surgical wound on the right foot is being followed by Dr. Doran Durand we did not look at this today. He has surgical shoes on both feet 8/23; left plantar foot wound not as good this week. Surrounding macerated skin and subcutaneous tissue everything looks moist and wet. I do not think he is offloading this adequately. He is using a surgical shoe Apparently the right foot surgical wound is not open although I did not check his foot 8/31; left plantar foot lateral aspect. Much improved this week. He has no maceration. Some improvement in the surface area of the wound but most impressively the depth is come in we are using silver alginate. The patient is a Product/process development scientist. He is asked that we write him a letter so he can go back to work. I have also tried to see if we can write something  that will allow him to limit the amount of time that he is on his foot at work. 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 Hopefully the latter can be avoided 9/6; I believe the patient missed an appointment last week. He arrives in today with a wound looking roughly the same certainly no better. Undermining laterally and also inferiorly. We used molecuLight today in training with the patient's permission.. We are using silver alginate 9/21 wound is measuring bigger this week although this may have to do with the aggressive circumferential debridement last week in response to the blush fluorescence on the MolecuLight. Culture I did last week showed significant MSSA and E. coli. I put him on Augmentin but he has not  started it yet. We are also going to send this for compounded antibiotics at [facility]. There is no evidence of systemic infection 9/29; silver alginate. His Keystone arrived. He is completing Augmentin in 2 days. Offloading in a cam boot. Moderate drainage per our intake staff 10/5; using silver alginate. He has been using his [place]. He has completed his Augmentin. Per our intake nurse still a lot of drainage, far too much to consider a total contact cast. Wound measures about the same. He had the same undermining area that I defined last week from a roughly 11-3. I remove this today 10/12; using silver alginate he is using the [place]. He comes in for a nurse visit hence we are applying [employer] twice a week. Measuring slightly better today and less notable drainage. Extensive debridement of the wound edge last time 10/18; using topical Keystone and silver alginate and a soft cast. Wound measurements about the same. Drainage was through his soft cast. We are changing this twice a week Tuesdays and Friday 10/25; comes in with moderate drainage. Still using Keystone silver alginate and a soft cast. Wound dimensions completely the same.He has a lot of edema in the left leg he has lymphedema. Asking for [name] to consider wrapping him as he cannot get his stocking on over the soft cast 11/2; comes in with moderate to large drainage slightly smaller in terms of width we have been using [place]. His wound looks satisfactory but not much improvement 11/4; patient presents today for obligatory cast change. Has no issues or complaints today. He denies signs of infection. 11/9; patient traveled this weekend to [place], was on the cast quite a bit. Staining of the cast with black material from his walking boot. Drainage was not quite as bad as we feared. Using silver alginate and Keystone 11/16; we do not have size for cast therefore we have been putting a soft cast on him since the change on Friday. Still a  significant amount of drainage necessitating changing twice a week. We have been using the Keystone at cast changes either hard or soft as well as silver alginate Comes in the clinic with things actually looking fairly good improvement in width. He says his offloading is about the same 02/24/2021 upon evaluation today patient actually comes back in and is doing excellent in regard to his foot ulcer this is significantly smaller even compared to the last visit. The soft cast seems to have done extremely well for him which is great news. I do not see any signs of infection minimal debridement will be needed today. 11/30; left lateral foot much improved half a centimeter improvement in surface area. No evidence of infection.  He seems to be doing better with the soft cast in the TCC therefore we will continue with this. He comes back in later in the week for a change with the nurses. This is due to drainage 12/6; no improvement in dimensions. Under illumination some debris on the surface we have been using silver alginate, soft cast. If there is anything optimistic here he seems to have have less drainage Objective Constitutional Sitting or standing Blood Pressure is within target range for patient.. Pulse regular and within target range for patient.Marland Kitchen Respirations regular, non-labored and within target range.. Temperature is normal and within the target range for the patient.Marland Kitchen Appears in no distress. Vitals Time Taken: 7:49 AM, Height: 77 in, Weight: 280 lbs, BMI: 33.2, Temperature: 98.8 F, Pulse: 93 bpm, Respiratory Rate: 18 breaths/min, Blood Pressure: 136/84 mmHg, Capillary Blood Glucose: 120 mg/dl. General Notes: Wound exam; the wound has not deteriorated but no improvement in dimensions. Under illumination there is a fibrinous surface which I used a #5 curette to debride. Hemostasis with direct pressure. Otherwise the surroundings of this wound look reasonably healthy Integumentary (Hair,  Skin) Wound #3 status is Open. Original cause of wound was Trauma. The date acquired was: 10/02/2019. The wound has been in treatment 71 weeks. The wound is located on the Left,Lateral Foot. The wound measures 2.2cm length x 1.8cm width x 0.4cm depth; 3.11cm^2 area and 1.244cm^3 volume. There is Fat Layer (Subcutaneous Tissue) exposed. There is no tunneling or undermining noted. There is a large amount of serosanguineous drainage noted. The wound margin is epibole. There is large (67-100%) red, pink granulation within the wound bed. There is a small (1-33%) amount of necrotic tissue within the wound bed including Adherent Slough. 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 There was a Excisional Skin/Subcutaneous Tissue Debridement with a total area of 3.96 sq cm performed by [name], MD. With the following instrument(s): Curette to remove Non-Viable tissue/material. Material removed includes Callus and Subcutaneous Tissue and after achieving pain control using Other (Benzocaine). No specimens were taken. A time out was conducted at 08:09, prior to the start of the procedure. A Minimum amount of bleeding was controlled with Pressure. The procedure was tolerated well. Post Debridement Measurements: 2.2cm length x 1.8cm width x 0.4cm depth; 1.244cm^3 volume. Character of Wound/Ulcer Post Debridement is stable. Severity of Tissue Post Debridement is: Fat layer exposed. Post procedure Diagnosis Wound #3: Same as Pre-Procedure Pre-procedure diagnosis of Wound #3 is a Diabetic Wound/Ulcer of the Lower Extremity located on the Left,Lateral Foot . An  Soft Cast procedure was performed by [name], RN. Post procedure Diagnosis Wound #3: Same as Pre-Procedure Plan Follow-up Appointments: Return Appointment in 1 week. - Dr. Dellia Nims Nurse Visit: - Friday 03/12/21 to change soft cast Bathing/ Shower/ Hygiene: May shower with protection but do not get wound dressing(s) wet. Edema Control - Lymphedema / SCD / Other: Elevate legs to the level of the heart or above for 30 minutes daily and/or when sitting, a frequency of: - throughout the day Avoid standing for long periods of time. Exercise regularly Moisturize legs daily. - right leg every night before bed. Compression stocking or Garment 20-30 mm/Hg pressure to: - Apply  to right leg in the morning and remove at night. Off-Loading: Other: - minimal weight bearing left foot Other: - Soft cast to left foot - wrap leg with kerlix/coban to reduce swelling Additional Orders / Instructions: Follow Nutritious Diet WOUND #3: - Foot Wound Laterality: Left, Lateral Cleanser: Soap and Water 2 x Per Week/30 Days Discharge Instructions: May shower and wash wound with dial antibacterial soap and water prior to dressing change. Cleanser: Wound Cleanser (Generic) 2 x Per Week/30 Days Discharge Instructions: Cleanse the wound with wound cleanser prior to applying a clean dressing using gauze sponges, not tissue or cotton balls. Peri-Wound Care: Zinc Oxide Ointment 30g tube 2 x Per Week/30 Days Discharge Instructions: Apply Zinc Oxide to periwound with each dressing change as needed. Topical: keystone antibiotic compound 2 x Per Week/30 Days Discharge Instructions: thin layer to wound bed Prim Dressing: KerraCel Ag Gelling Fiber Dressing, 4x5 in (silver alginate) (Generic) 2 x Per Week/30 Days ary Discharge Instructions: Apply silver alginate to wound bed as instructed Secondary Dressing: ABD Pad, 8x10 2 x Per Week/30 Days Discharge Instructions: Apply over primary dressing as directed. Secondary  Dressing: Zetuvit Plus 4x8 in (Generic) 2 x Per Week/30 Days Discharge Instructions: Apply over primary dressing as directed. Com pression Wrap: Kerlix Roll 4.5x3.1 (in/yd) 2 x Per Week/30 Days Discharge Instructions: Apply Kerlix and Coban compression as directed. Com pression Wrap: Coban Self-Adherent Wrap 4x5 (in/yd) 2 x Per Week/30 Days Discharge Instructions: Apply over Kerlix as directed. 1. We continued with the same dressing which was his MolecuLight with silver alginate 2. His drainage seemed better. This may make it possible for a weekly visit plus or minus a total contact cast 3. He required debridement today because of a nonviable surface I am hopeful the debridement will allow further improvements in the surface area Electronic Signature(s) Signed: 03/09/2021 4:21:48 PM By: [name] MD Entered By: [name] on 03/09/2021 08:31:46 -------------------------------------------------------------------------------- SuperBill Details Patient Name: Date of Service: [name], [name] 03/09/2021 Medical Record Number: [id number] Patient Account Number: [id number] Date of Birth/Sex: Treating RN: [birth year] (35 y.o. [name] Primary Care Provider: [name] Other Clinician: Referring Provider: Treating Provider/Extender: [name] in Treatment: 71 Diagnosis Coding ICD-10 Codes Code Description E11.621 Type 2 diabetes mellitus with foot ulcer L97.528 Non-pressure chronic ulcer of other part of left foot with other specified severity Facility Procedures CPT4 Code: [id number] Description: 02637 - DEB SUBQ TISSUE 20 SQ CM/< ICD-10 Diagnosis Description L97.528 Non-pressure chronic ulcer of other part of left foot with other specified seve Modifier: rity Quantity: 1 Physician Procedures : CPT4 Code Description Modifier [id number] - WC PHYS SUBQ TISS 20 SQ CM ICD-10 Diagnosis Description L97.528 Non-pressure chronic ulcer of other part  of left foot with other specified severity Quantity: 1 Electronic Signature(s) Signed: 03/09/2021 4:21:48 PM By: [name] MD Entered By: [name] on 03/09/2021 08:32:02

## 2021-03-12 ENCOUNTER — Encounter (HOSPITAL_BASED_OUTPATIENT_CLINIC_OR_DEPARTMENT_OTHER): Payer: BC Managed Care – PPO | Admitting: Internal Medicine

## 2021-03-12 ENCOUNTER — Other Ambulatory Visit: Payer: Self-pay

## 2021-03-12 DIAGNOSIS — E11621 Type 2 diabetes mellitus with foot ulcer: Secondary | ICD-10-CM | POA: Diagnosis not present

## 2021-03-12 NOTE — Progress Notes (Signed)
[place], [name] ([id number]) Visit Report for 03/12/2021 Soft Cast Details Patient Name: Date of Service: [name] 03/12/2021 8:30 A M Medical Record Number: [id number] Patient Account Number: [id number] Date of Birth/Sex: Treating RN: 28-Jun-1985 (35 y.o. [name] Primary Care Provider: [name] Other Clinician: Referring Provider: Treating Provider/Extender: Dione Plover in Treatment: 72 Procedure Performed for: Wound #3 Left,Lateral Foot Performed By: Clinician Shawn Stall, RN Electronic Signature(s) Signed: 03/12/2021 10:31:08 AM By: Geralyn Corwin DO Signed: 03/12/2021 12:23:12 PM By: Shawn Stall RN, BSN Entered By: Shawn Stall on 03/12/2021 09:38:10 -------------------------------------------------------------------------------- SuperBill Details Patient Name: Date of Service: [name], [name] 03/12/2021 Medical Record Number: [id number] Patient Account Number: [id number] Date of Birth/Sex: Treating RN: 04/20/1985 (35 y.o. [name] Primary Care Provider: [name] Other Clinician: Referring Provider: Treating Provider/Extender: Dione Plover in Treatment: 72 Diagnosis Coding ICD-10 Codes Code Description E11.621 Type 2 diabetes mellitus with foot ulcer L97.528 Non-pressure chronic ulcer of other part of left foot with other specified severity Facility Procedures CPT4 Code: [id number] Description: [phone] Application of clubfoot cast with molding or manipulation, long or short leg Modifier: Quantity: 1 Electronic Signature(s) Signed: 03/12/2021 10:31:08 AM By: Geralyn Corwin DO Signed: 03/12/2021 12:23:12 PM By: Shawn Stall RN, BSN Entered By: Shawn Stall on 03/12/2021 09:38:49

## 2021-03-12 NOTE — Progress Notes (Signed)
[place], [name] ([id number]) Visit Report for 03/12/2021 Arrival Information Details Patient Name: Date of Service: [name] 03/12/2021 8:30 A M Medical Record Number: [id number] Patient Account Number: [id number] Date of Birth/Sex: Treating RN: 1986-01-18 (35 y.o. [name], [name] Primary Care [name]: [name] Other Clinician: Referring [name]: Treating [name] in Treatment: 36 Visit Information History Since Last Visit Added or deleted any medications: No Patient Arrived: Ambulatory Any new allergies or adverse reactions: No Arrival Time: 09:00 Had a fall or experienced change in No Accompanied By: self activities of daily living that may affect Transfer Assistance: None risk of falls: Patient Identification Verified: Yes Signs or symptoms of abuse/neglect since No Secondary Verification Process Completed: Yes last visito Patient Requires Transmission-Based Precautions: No Hospitalized since last visit: No Patient Has Alerts: No Implantable device outside of the clinic No excluding cellular tissue based products placed in the center since last visit: Has Dressing in Place as Prescribed: Yes Has Compression in Place as Prescribed: Yes Has Footwear/Offloading in Place as Yes Prescribed: Left: Surgical Shoe with Pressure Relief Insole Other:soft cast Pain Present Now: No Electronic Signature(s) Signed: 03/12/2021 12:23:12 PM By: Shawn Stall RN, BSN Entered By: Shawn Stall on 03/12/2021 09:06:21 -------------------------------------------------------------------------------- Encounter Discharge Information Details Patient Name: Date of Service: [name] 03/12/2021 8:30 A M Medical Record Number: [id number] Patient Account Number: [id number] Date of Birth/Sex: Treating RN: December 18, 1985 (35 y.o. [name] Primary Care Aneri Slagel: [name] Other Clinician: Referring [name]: Treating  [name] in Treatment: 16 Encounter Discharge Information Items Discharge Condition: Stable Ambulatory Status: Ambulatory Discharge Destination: Home Transportation: Private Auto Accompanied By: self Schedule Follow-up Appointment: Yes Clinical Summary of Care: Electronic Signature(s) Signed: 03/12/2021 12:23:12 PM By: Shawn Stall RN, BSN Signed: 03/12/2021 12:23:12 PM By: Shawn Stall RN, BSN Entered By: Shawn Stall on 03/12/2021 09:38:41 -------------------------------------------------------------------------------- Patient/Caregiver Education Details Patient Name: Date of Service: [name] 12/9/2022andnbsp8:30 A M Medical Record Number: [id number] Patient Account Number: [id number] Date of Birth/Gender: Treating RN: 10/15/1985 (35 y.o. [name] Primary Care Physician: [name] Other Clinician: Referring Physician: Treating Physician/Extender: Dione Plover in Treatment: 86 Education Assessment Education Provided To: Patient Education Topics Provided Wound/Skin Impairment: Handouts: Skin Care Do's and Dont's Methods: Explain/Verbal Responses: Reinforcements needed Electronic Signature(s) Signed: 03/12/2021 12:23:12 PM By: Shawn Stall RN, BSN Entered By: Shawn Stall on 03/12/2021 09:38:30 -------------------------------------------------------------------------------- Wound Assessment Details Patient Name: Date of Service: [name] 03/12/2021 8:30 A M Medical Record Number: [id number] Patient Account Number: [id number] Date of Birth/Sex: Treating RN: [birth year] (35 y.o. [name] Primary Care Mlissa Tamayo: [name] Other Clinician: Referring [name]: Treating [name] in Treatment: 72 Wound Status Wound Number: 3 Primary Etiology: Diabetic Wound/Ulcer of the Lower Extremity Wound Location:  Left, Lateral Foot Wound Status: Open Wounding Event: Trauma Date Acquired: 10/02/2019 Weeks Of Treatment: 72 Clustered Wound: No Wound Measurements Length: (cm) 2.2 Width: (cm) 1.8 Depth: (cm) 0.4 Area: (cm) 3.11 Volume: (cm) 1.244 % Reduction in Area: -88.6% % Reduction in Volume: -653.9% Wound Description Classification: Grade 2 Exudate Amount: Large Exudate Type: Serosanguineous Exudate Color: red, brown Treatment Notes Wound #3 (Foot) Wound Laterality: Left, Lateral Cleanser Soap and Water Discharge Instruction: May shower and wash wound wit Wound Cleanser Discharge Instruction: Cleanse the wound with wound balls. h dial antibacterial soap and water prior to dressing change. cleanser prior to applying a  clean dressing using gauze sponges, not tissue or cotton Peri-Wound Care Zinc Oxide Ointment 30g tube Discharge Instruction: Apply Zinc Oxide to periwound with each dressing change as needed. Topical keystone antibiotic compound Discharge Instruction: thin layer to wound bed Primary Dressing KerraCel Ag Gelling Fiber Dressing, 4x5 in (silver alginate) Discharge Instruction: Apply silver alginate to wound bed as instructed Secondary Dressing ABD Pad, 8x10 Discharge Instruction: Apply over primary dressing as directed. Zetuvit Plus 4x8 in Discharge Instruction: Apply over primary dressing as directed. Secured With Compression Wrap Kerlix Roll 4.5x3.1 (in/yd) Discharge Instruction: Apply Kerlix and Coban compression as directed. Coban Self-Adherent Wrap 4x5 (in/yd) Discharge Instruction: Apply over Kerlix as directed. Compression Stockings Add-Ons Electronic Signature(s) Signed: 03/12/2021 12:23:12 PM By: Deon Pilling RN, BSN Entered By: Deon Pilling on 03/12/2021 09:37:52 -------------------------------------------------------------------------------- Vitals Details Patient Name: Date of Service: [name] 03/12/2021 8:30 A M Medical Record  Number: [id number] Patient Account Number: [id number] Date of Birth/Sex: Treating RN: 05-Mar-1986 (35 y.o. [name] Primary Care Ebunoluwa Gernert: [name] Other Clinician: Referring Lorcan Shelp: Treating Deshanae Lindo/Extender: [name] in Treatment: 72 Vital Signs Time Taken: 09:00 Temperature (F): 98.1 Height (in): 77 Pulse (bpm): 80 Weight (lbs): 280 Respiratory Rate (breaths/min): 20 Body Mass Index (BMI): 33.2 Blood Pressure (mmHg): 139/88 Capillary Blood Glucose (mg/dl): 130 Reference Range: 80 - 120 mg / dl Electronic Signature(s) Signed: 03/12/2021 12:23:12 PM By: Deon Pilling RN, BSN Entered By: Deon Pilling on 03/12/2021 09:07:17

## 2021-03-16 ENCOUNTER — Other Ambulatory Visit: Payer: Self-pay

## 2021-03-16 ENCOUNTER — Encounter (HOSPITAL_BASED_OUTPATIENT_CLINIC_OR_DEPARTMENT_OTHER): Payer: BC Managed Care – PPO | Admitting: Internal Medicine

## 2021-03-16 DIAGNOSIS — E11621 Type 2 diabetes mellitus with foot ulcer: Secondary | ICD-10-CM | POA: Diagnosis not present

## 2021-03-16 NOTE — Progress Notes (Signed)
[name] ([id number]) Visit Report for 03/16/2021 Arrival Information Details Patient Name: Date of Service: [name] D 03/16/2021 7:30 A M Medical Record Number: [id number] Patient Account Number: [id number] Date of Birth/Sex: Treating RN: [birth year] (35 y.o. [name] Primary Care Oscar Hank: [name] Other Clinician: Referring Ryna Beckstrom: Treating Haziel Molner/Extender: [name] in Treatment: 72 Visit Information History Since Last Visit Added or deleted any medications: No Patient Arrived: Ambulatory Any new allergies or adverse reactions: No Arrival Time: 08:00 Had a fall or experienced change in No Transfer Assistance: None activities of daily living that may affect Patient Identification Verified: Yes risk of falls: Secondary Verification Process Completed: Yes Signs or symptoms of abuse/neglect since No Patient Requires Transmission-Based Precautions: No last visito Patient Has Alerts: No Hospitalized since last visit: No Implantable device outside of the clinic No excluding cellular tissue based products placed in the center since last visit: Has Dressing in Place as Prescribed: Yes Has Compression in Place as Prescribed: Yes Has Footwear/Offloading in Place as Yes Prescribed: Left: Surgical Shoe with Pressure Relief Insole Pain Present Now: No Electronic Signature(s) Signed: 03/16/2021 5:08:15 PM By: [name] Entered By: [name] on 03/16/2021 08:01:50 -------------------------------------------------------------------------------- Encounter Discharge Information Details Patient Name: Date of Service: [name], [name] 03/16/2021 7:30 A M Medical Record Number: [id number] Patient Account Number: [id number] Date of Birth/Sex: Treating RN: 02-18-1986 (35 y.o. [name] Primary Care [name]: [name] Other Clinician: Referring Brittania Sudbeck: Treating Mychael Smock/Extender: [name] in Treatment: 72 Encounter Discharge Information Items Post Procedure Vitals Discharge Condition: Stable Temperature (F): 97.9 Ambulatory Status: Ambulatory Pulse (bpm): 84 Discharge Destination: Home Respiratory Rate (breaths/min): 18 Transportation: Private Auto Blood Pressure (mmHg): 154/87 Schedule Follow-up Appointment: Yes Clinical Summary of Care: Provided on 03/16/2021 Form Type Recipient Paper Patient Patient Electronic Signature(s) Signed: 03/16/2021 8:55:15 AM By: [name] Signed: 03/16/2021 8:55:15 AM By: [name] Entered By: [name] on 03/16/2021 08:55:14 -------------------------------------------------------------------------------- Lower Extremity Assessment Details Patient Name: Date of Service: [name], [name] 03/16/2021 7:30 A M Medical Record Number: [id number] Patient Account Number: [id number] Date of Birth/Sex: Treating RN: 16-Nov-1985 (35 y.o. [name] Primary Care Guelda Batson: [name] Other Clinician: Referring Kaiser Belluomini: Treating Nyeem Stoke/Extender: [name] in Treatment: 72 Edema Assessment Assessed: [name]: Yes] [name]: No] Edema: [Left: Ye] [Right: s] Calf Left: Right: Point of Measurement: 48 cm From Medial Instep 48 cm Ankle Left: Right: Point of Measurement: 11 cm From Medial Instep 30 cm Vascular Assessment Pulses: Dorsalis Pedis Palpable: [Left:Yes] Electronic Signature(s) Signed: 03/16/2021 5:08:15 PM By: [name] Entered By: [name] on 03/16/2021 08:10:09 -------------------------------------------------------------------------------- Multi Wound Chart Details Patient Name: Date of Service: [name], [name] 03/16/2021 7:30 A M Medical Record Number: [id number] Patient Account Number: [id number] Date of Birth/Sex: Treating RN: Feb 05, 1986 (35 y.o. [name] Primary Care [name]: [name] Other Clinician: Referring  Lovelle Deitrick: Treating Dudley Mages/Extender: [name] in Treatment: 72 Vital Signs Height(in): 77 Capillary Blood Glucose(mg/dl): 100 Weight(lbs): 280 Pulse(bpm): 38 Body Mass Index(BMI): 33 Blood Pressure(mmHg): 154/87 Temperature(F): 97.9 Respiratory Rate(breaths/min): 18 Photos: [3:No Photos Left, Lateral Foot] [N/A:N/A N/A] Wound Location: [3:Trauma] [N/A:N/A] Wounding Event: [3:Diabetic Wound/Ulcer of the Lower] [N/A:N/A] Primary Etiology: [3:Extremity Type II Diabetes] [N/A:N/A] Comorbid History: [3:10/02/2019] [N/A:N/A] Date Acquired: [3:72] [N/A:N/A] Weeks of Treatment: [3:Open] [N/A:N/A] Wound Status: [3:1.9x1.5x0.3] [N/A:N/A] Measurements L x W x D (cm) [3:2.238] [N/A:N/A] A (cm) : rea [3:0.672] [N/A:N/A] Volume (cm) : [  3:-35.70%] [N/A:N/A] % Reduction in A rea: [3:-307.30%] [N/A:N/A] % Reduction in Volume: [3:12] Starting Position 1 (o'clock): [3:3] Ending Position 1 (o'clock): [3:0.3] Maximum Distance 1 (cm): [3:Yes] [N/A:N/A] Undermining: [3:Grade 2] [N/A:N/A] Classification: [3:Medium] [N/A:N/A] Exudate A mount: [3:Serosanguineous] [N/A:N/A] Exudate Type: [3:red, brown] [N/A:N/A] Exudate Color: [3:Distinct, outline attached] [N/A:N/A] Wound Margin: [3:Large (67-100%)] [N/A:N/A] Granulation A mount: [3:Red, Pink] [N/A:N/A] Granulation Quality: [3:None Present (0%)] [N/A:N/A] Necrotic A mount: [3:Fat Layer (Subcutaneous Tissue): Yes N/A] Exposed Structures: [3:Fascia: No Tendon: No Muscle: No Joint: No Bone: No None] [N/A:N/A] Epithelialization: [3:Debridement - Selective/Open Wound N/A] Debridement: Pre-procedure Verification/Time Out 08:22 [N/A:N/A] Taken: [3:Other] [N/A:N/A] Pain Control: [3:Callus] [N/A:N/A] Tissue Debrided: [3:Skin/Dermis] [N/A:N/A] Level: [3:0.5] [N/A:N/A] Debridement A (sq cm): [3:rea Curette] [N/A:N/A] Instrument: [3:Moderate] [N/A:N/A] Bleeding: [3:Pressure] [N/A:N/A] Hemostasis A chieved: [3:Procedure  was tolerated well] [N/A:N/A] Debridement Treatment Response: [3:1.9x1.5x0.3] [N/A:N/A] Post Debridement Measurements L x W x D (cm) [3:0.672] [N/A:N/A] Post Debridement Volume: (cm) [3:Calloused Periwound] [N/A:N/A] Assessment Notes: [3:Debridement] [N/A:N/A] Procedures Performed: [3:Soft Cast] Treatment Notes Electronic Signature(s) Signed: 03/16/2021 4:30:32 PM By: [name] MD Signed: 03/16/2021 4:51:20 PM By: [name] RN, BSN Entered By: [name] on 03/16/2021 08:46:32 -------------------------------------------------------------------------------- Multi-Disciplinary Care Plan Details Patient Name: Date of Service: [name], [name] 03/16/2021 7:30 A M Medical Record Number: [id number] Patient Account Number: [id number] Date of Birth/Sex: Treating RN: [birth year] (35 y.o. [name] Primary Care Uriel Horkey: [name] Other Clinician: Referring Rossi Burdo: Treating Janelle Spellman/Extender: [name] in Treatment: 72 Multidisciplinary Care Plan reviewed with physician Active Inactive Nutrition Nursing Diagnoses: Imbalanced nutrition Potential for alteratiion in Nutrition/Potential for imbalanced nutrition Goals: Patient/caregiver agrees to and verbalizes understanding of need to use nutritional supplements and/or vitamins as prescribed Date Initiated: 10/24/2019 Date Inactivated: 04/06/2020 Target Resolution Date: 04/03/2020 Goal Status: Met Patient/caregiver will maintain therapeutic glucose control Date Initiated: 10/24/2019 Target Resolution Date: 04/02/2021 Goal Status: Active Interventions: Assess HgA1c results as ordered upon admission and as needed Assess patient nutrition upon admission and as needed per policy Provide education on elevated blood sugars and impact on wound healing Provide education on nutrition Treatment Activities: Education provided on Nutrition : 12/02/2020 Notes: 11/17/20: Glucose control  ongoing issue, target date extended. 01/26/21: Glucose management continues. Wound/Skin Impairment Nursing Diagnoses: Impaired tissue integrity Knowledge deficit related to ulceration/compromised skin integrity Goals: Patient/caregiver will verbalize understanding of skin care regimen Date Initiated: 10/24/2019 Target Resolution Date: 04/02/2021 Goal Status: Active Ulcer/skin breakdown will have a volume reduction of 30% by week 4 Date Initiated: 10/24/2019 Date Inactivated: 01/16/2020 Target Resolution Date: 01/10/2020 Unmet Reason: no change in Goal Status: Unmet measurements. Interventions: Assess patient/caregiver ability to obtain necessary supplies Assess patient/caregiver ability to perform ulcer/skin care regimen upon admission and as needed Assess ulceration(s) every visit Provide education on ulcer and skin care Notes: 11/17/20: Wound care regimen continues Electronic Signature(s) Signed: 03/16/2021 5:08:15 PM By: [name] Entered By: [name] on 03/16/2021 08:13:49 -------------------------------------------------------------------------------- Pain Assessment Details Patient Name: Date of Service: [name], [name] 03/16/2021 7:30 A M Medical Record Number: [id number] Patient Account Number: [id number] Date of Birth/Sex: Treating RN: [birth year] (35 y.o. [name] Primary Care Scherrie Seneca: [name] Other Clinician: Referring Jenina Moening: Treating Dasha Kawabata/Extender: [name] in Treatment: 72 Active Problems Location of Pain Severity and Description of Pain Patient Has Paino No Site Locations Pain Management and Medication Current Pain Management: Electronic Signature(s) Signed: 03/16/2021 5:08:15 PM By: [name] Entered By: [name] on 03/16/2021 08:02:37 -------------------------------------------------------------------------------- Patient/Caregiver Education Details Patient  Name: Date of  Service: [name] D 12/13/2022andnbsp7:30 A M Medical Record Number: [id number] Patient Account Number: [id number] Date of Birth/Gender: Treating RN: 1986-03-02 (35 y.o. [name] Primary Care Physician: [name] Other Clinician: Referring Physician: Treating Physician/Extender: [name] in Treatment: 52 Education Assessment Education Provided To: Patient Education Topics Provided Elevated Blood Sugar/ Impact on Healing: Methods: Explain/Verbal, Printed Responses: State content correctly Venous: Methods: Explain/Verbal, Printed Responses: State content correctly Wound/Skin Impairment: Methods: Explain/Verbal, Printed Responses: State content correctly Electronic Signature(s) Signed: 03/16/2021 5:08:15 PM By: [name] Entered By: [name] on 03/16/2021 08:14:18 -------------------------------------------------------------------------------- Wound Assessment Details Patient Name: Date of Service: [name], [name] 03/16/2021 7:30 A M Medical Record Number: [id number] Patient Account Number: [id number] Date of Birth/Sex: Treating RN: 29-Apr-1985 (35 y.o. [name] Primary Care Odyssey Vasbinder: [name] Other Clinician: Referring Rashied Corallo: Treating Jaykwon Morones/Extender: [name] in Treatment: 72 Wound Status Wound Number: 3 Primary Etiology: Diabetic Wound/Ulcer of the Lower Extremity Wound Location: Left, Lateral Foot Wound Status: Open Wounding Event: Trauma Comorbid History: Type II Diabetes Date Acquired: 10/02/2019 Weeks Of Treatment: 72 Clustered Wound: No Wound Measurements Length: (cm) 1.9 Width: (cm) 1.5 Depth: (cm) 0.3 Area: (cm) 2.238 Volume: (cm) 0.672 % Reduction in Area: -35.7% % Reduction in Volume: -307.3% Epithelialization: None Tunneling: No Undermining: Yes Starting Position (o'clock): 12 Ending Position (o'clock): 3 Maximum Distance: (cm)  0.3 Wound Description Classification: Grade 2 Wound Margin: Distinct, outline attached Exudate Amount: Medium Exudate Type: Serosanguineous Exudate Color: red, brown Foul Odor After Cleansing: No Slough/Fibrino No Wound Bed Granulation Amount: Large (67-100%) Exposed Structure Granulation Quality: Red, Pink Fascia Exposed: No Necrotic Amount: None Present (0%) Fat Layer (Subcutaneous Tissue) Exposed: Yes Tendon Exposed: No Muscle Exposed: No Joint Exposed: No Bone Exposed: No Assessment Notes Calloused Periwound Treatment Notes Wound #3 (Foot) Wound Laterality: Left, Lateral Cleanser Soap and Water Discharge Instruction: May shower and wash wound with dial antibacterial soap and water prior to dressing change. Wound Cleanser Discharge Instruction: Cleanse the wound with wound cleanser prior to applying a clean dressing using gauze sponges, not tissue or cotton balls. Peri-Wound Care Zinc Oxide Ointment 30g tube Discharge Instruction: Apply Zinc Oxide to periwound with each dressing change as needed. Topical keystone antibiotic compound Discharge Instruction: thin layer to wound bed Primary Dressing KerraCel Ag Gelling Fiber Dressing, 4x5 in (silver alginate) Discharge Instruction: Apply silver alginate to wound bed as instructed Secondary Dressing ABD Pad, 8x10 Discharge Instruction: Apply over primary dressing as directed. Zetuvit Plus 4x8 in Discharge Instruction: Apply over primary dressing as directed. Secured With Compression Wrap Kerlix Roll 4.5x3.1 (in/yd) Discharge Instruction: Apply Kerlix and Coban compression as directed. Coban Self-Adherent Wrap 4x5 (in/yd) Discharge Instruction: Apply over Kerlix as directed. Compression Stockings Add-Ons Electronic Signature(s) Signed: 03/16/2021 5:08:15 PM By: [name] Entered By: [name] on 03/16/2021 08:08:23 -------------------------------------------------------------------------------- Vitals  Details Patient Name: Date of Service: [name], [name] 03/16/2021 7:30 A M Medical Record Number: [id number] Patient Account Number: [id number] Date of Birth/Sex: Treating RN: 21-Aug-1985 (35 y.o. [name] Primary Care Dannelle Rhymes: [name] Other Clinician: Referring Ingris Pasquarella: Treating Irie Dowson/Extender: [name] in Treatment: 72 Vital Signs Time Taken: 08:02 Temperature (F): 97.9 Height (in): 77 Pulse (bpm): 84 Weight (lbs): 280 Respiratory Rate (breaths/min): 18 Body Mass Index (BMI): 33.2 Blood Pressure (mmHg): 154/87 Capillary Blood Glucose (mg/dl): 100 Reference Range: 80 - 120 mg / dl Electronic Signature(s) Signed: 03/16/2021 5:08:15 PM  By: [name] Entered By: [name] on 03/16/2021 08:02:31

## 2021-03-16 NOTE — Progress Notes (Signed)
[name], [name] ([id number]) Visit Report for 03/16/2021 Debridement Details Patient Name: Date of Service: [name] D 03/16/2021 7:30 A M Medical Record Number: [id number] Patient Account Number: [id number] Date of Birth/Sex: Treating RN: 27-Aug-1985 (35 y.o. [name] Primary Care Provider: [name] Other Clinician: Referring Provider: Treating Provider/Extender: [name] in Treatment: 72 Debridement Performed for Assessment: Wound #3 Left,Lateral Foot Performed By: Physician [name], MD Debridement Type: Debridement Severity of Tissue Pre Debridement: Fat layer exposed Level of Consciousness (Pre-procedure): Awake and Alert Pre-procedure Verification/Time Out Yes - 08:22 Taken: Start Time: 08:23 Pain Control: Other : Benzocaine T Area Debrided (L x W): otal 1 (cm) x 0.5 (cm) = 0.5 (cm) Tissue and other material debrided: Non-Viable, Callus, Subcutaneous, Skin: Dermis Level: Skin/Subcutaneous Tissue Debridement Description: Excisional Instrument: Curette Bleeding: Moderate Hemostasis Achieved: Pressure End Time: 08:27 Response to Treatment: Procedure was tolerated well Level of Consciousness (Post- Awake and Alert procedure): Post Debridement Measurements of Total Wound Length: (cm) 1.9 Width: (cm) 1.5 Depth: (cm) 0.3 Volume: (cm) 0.672 Character of Wound/Ulcer Post Debridement: Stable Severity of Tissue Post Debridement: Fat layer exposed Post Procedure Diagnosis Same as Pre-procedure Electronic Signature(s) Signed: 03/16/2021 4:30:32 PM By: [name] MD Signed: 03/16/2021 4:51:20 PM By: [name] RN, BSN Entered By: [name] on 03/16/2021 08:46:47 -------------------------------------------------------------------------------- HPI Details Patient Name: Date of Service: [name], [name] 03/16/2021 7:30 A M Medical Record Number: [id number] Patient Account Number: [id number] Date of  Birth/Sex: Treating RN: [birth year] (35 y.o. 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 He comes in for review of a wound over the plantar fifth metatarsal head extending into the lateral part of the foot. He was followed for this previously by his podiatrist Dr. Cornelius Moras. As the patient tells his story he went to see podiatry first for a swelling he developed on the lateral part of his fifth metatarsal head in May. He states this was "open" by podiatry and the area closed. He was followed up in June and it was again opened callus removed and it closed promptly. There were plans being made for surgery on the fifth metatarsal head in June however his blood sugar was apparently too high for anesthesia. Apparently the area was debrided and opened again in June and it is never closed since. Looking over the records from podiatry I am really not able to follow this. It was clear when he was first seen it was before 5/14 at that point he already had a wound. By 5/17 the ulcer was resolved. I do not see anything about a procedure. On 5/28 noted to have pre-ulcerative moderate keratosis. X-ray noted 1/5 contracted toe and tailor's bunion and metatarsal deformity. On a visit date on 09/28/2018 the dorsal part of the left foot it healed and resolved. There was concern about swelling in his lower extremity he was sent to the ER.. As far as I can tell he was seen in the ER on 7/12 with an ulcer on his left foot. A DVT rule out of the left leg was negative. I do not think I have complete records from podiatry but I am not able to verify the procedures this patient states he had. He states after the last procedure the wound has  never closed  although I am not able to follow this in the records I have from podiatry. He has not had a recent x-ray The patient has been using Neosporin on the wound. He is wearing a Darco shoe. He is still very active up on his foot working and exercising. Past medical history; type 2 diabetes ketosis-prone, leg swelling with a negative DVT study in July. Non-smoker ABI in our clinic was 0.85 on the left 10/16; substantial wound on the plantar left fifth met head extending laterally almost to the dorsal fifth MTP. We have been using silver alginate we gave him a Darco forefoot off loader. An x-ray did not show evidence of osteomyelitis did note soft tissue emphysema which I think was due to gas tracking through an open wound. There is no doubt in my mind he requires an MRI 10/23; MRI not booked until 3 November at the earliest this is largely due to his glucose sensor in the right arm. We have been using silver alginate. There has been an improvement 10/29; I am still not exactly sure when his MRI is booked for. He says it is the third but it is the 10th in epic. This definitely needs to be done. He is running a low-grade fever today but no other symptoms. No real improvement in the 1 02/26/2019 patient presents today for a follow-up visit here in our clinic he is last been seen in the clinic on October 29. Subsequently we were working on getting MRI to evaluate and see what exactly was going on and where we would need to go from the standpoint of whether or not he had osteomyelitis and again what treatments were going be required. Subsequently the patient ended up being admitted to the hospital on 02/07/2019 and was discharged on 02/14/2019. This is a somewhat interesting admission with a discharge diagnosis of pneumonia due to COVID-19 although he was positive for COVID-19 when tested at the urgent care but negative x2 when he was actually in the hospital. With that being said he did have acute respiratory  failure with hypoxia and it was noted he also have a left foot ulceration with osteomyelitis. With that being said he did require oxygen for his pneumonia and I level 4 L. He was placed on antivirals and steroids for the COVID-19. He was also transferred to the [facility] at one point. Nonetheless he did subsequently discharged home and since being home has done much better in that regard. The CT angiogram did not show any pulmonary embolism. With regard to the osteomyelitis the patient was placed on vancomycin and Zosyn while in the hospital but has been changed to Augmentin at discharge. It was also recommended that he follow- up with wound care and podiatry. Podiatry however wanted him to see [name] according to the patient prior to them doing anything further. His hemoglobin A1c was 9.9 as noted in the hospital. Have an MRI of the left foot performed while in the hospital on 02/04/2019. 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 There is an overlying plantar open wound noted an abscess tracking back along the lateral aspect of the fifth metatarsal shaft. There is otherwise diffuse cellulitis and mild fasciitis without findings of polymyositis. The patient did have recently pneumonia secondary to COVID-19 I looked in the chart through epic and it does appear that the patient may need to have an additional x-ray just  to ensure everything is cleared and that he has no airspace disease prior to putting him into the [name]. 03/05/2019; patient was readmitted to the clinic last week. He was hospitalized twice for a viral upper respiratory tract infection from 11/1 through 11/4 and then 11/5 through 11/12 ultimately this turned out to be Covid pneumonitis. Although he was discharged on oxygen he is not using it. He says he feels fine. He has no exercise limitation no cough no sputum. His O2 sat in our  clinic today was 100% on room air. 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 He received Vanco and Zosyn in the hospital and then was discharged on 2 weeks of Augmentin. I do not see any relevant cultures. He was supposed to follow-up with infectious disease but I do not see that he has an appointment. 12/8; patient saw Dr. Novella Olive of infectious disease last week. He felt that he had had adequate antibiotic therapy. He did not go to follow-up with Dr. Amalia Hailey of podiatry and I have again talked to him about the pros and cons of this. He does not want to consider a ray amputation of this time. He is aware of the risks of recurrence, migration etc. He started HBO today and tolerated this well. He can complete the Augmentin that I gave him last week. I have looked over the lab work that Dr. Chana Bode ordered his C-reactive protein was 3.3 and his sedimentation rate was 17. The C-reactive protein is never really been measurably that high in this patient 12/15; not much change in the wound today however he has undermining along the lateral part of the foot again more extensively than last week. He has some rims of epithelialization. We have been using silver alginate. He is undergoing hyperbarics but did not dive today 12/18; in for his obligatory first total contact cast change. Unfortunately there was pus coming from the undermining area around his fifth metatarsal head. This was cultured but will preclude reapplication of a cast. He is seen in conjunction with HBO 12/24; patient had staph lugdunensis in the wound in the undermining area laterally last time. We put him on doxycycline which should have covered this. The wound looks better today. I am going to give him another week of doxycycline before reattempting the total contact cast 12/31; the patient is completing antibiotics. Hemorrhagic  debris in the distal part of the wound with some undermining distally. He also had hyper granulation. Extensive debridement with a #5 curette. The infected area that was on the lateral part of the fifth met head is closed over. I do not think he needs any more antibiotics. Patient was seen prior to HBO. Preparations for a total contact cast were made in the cast will be placed post hyperbarics 04/11/19; once again the patient arrives today without complaint. He had been in a cast all week noted that he had heavy drainage this week. This resulted in large raised areas of macerated tissue around the wound 1/14; wound bed looks better slightly smaller. Hydrofera Blue has been changing himself. He had a heavy drainage last week which caused a lot of maceration around the wound so I took him out of a total contact cast he says the drainage is actually better this week He is seen today in conjunction with HBO 1/21; returns to clinic. He was up in [place] for a day or  2 attending a funeral. He comes back in with the wound larger and with a large area of exposed bone. He had osteomyelitis and septic arthritis of the fifth left metatarsal head while he was in hospital. He received IV antibiotics in the hospital for a prolonged period of time then 3 weeks of Augmentin. Subsequently I gave him 2 weeks of doxycycline for more superficial wound infection. When I saw this last week the wound was smaller the surface of the wound looks satisfactory. 1/28; patient missed hyperbarics today. Bone biopsy I did last time showed Enterococcus faecalis and Staphylococcus lugdunensis . He has a wide area of exposed bone. We are going to use silver alginate as of today. I had another ethical discussion with the patient. 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 In this situation I think the likelihood of healing this is low. Therefore I have recommended a ray amputation and with the patient's  agreement I have referred him to Dr. Doran Durand. The other issue is that his compliance with hyperbarics has been minimal because of his work schedule and given his underlying decision I am going to stop this today READMISSION 10/24/2019 MRI 09/29/2019 left foot IMPRESSION: 1. Apparent skin ulceration inferior and lateral to the 5th metatarsal base with underlying heterogeneous T2 signal and enhancement in the subcutaneous fat. Small peripherally enhancing fluid collections along the plantar and lateral aspects of the 5th metatarsal base suspicious for abscesses. 2. Interval amputation through the mid 5th metatarsal with nonspecific low-level marrow edema and enhancement. Given the proximity to the adjacent soft tissue inflammatory changes, osteomyelitis cannot be excluded. 3. The additional bones appear unremarkable. MRI 09/29/2019 right foot IMPRESSION: 1. Soft tissue ulceration lateral to the 5th MTP joint. There is low-level T2 hyperintensity within the 4th and 5th metatarsal heads and adjacent proximal phalanges without abnormal T1 signal or cortical destruction. These findings are nonspecific and could be seen with early marrow edema, hyperemia or early osteomyelitis. No evidence of septic joint. 2. Mild tenosynovitis and synovial enhancement associated with the extensor digitorum tendons at the level of the midfoot. 3. Diffuse low-level muscular T2 hyperintensity and enhancement, most consistent with diabetic myopathy. LEFT FOOT BONE Methicillin resistant staphylococcus aureus Staphylococcus lugdunensis MIC MIC CIPROFLOXACIN >=8 RESISTANT Resistant <=0.5 SENSI... Sensitive CLINDAMYCIN <=0.25 SENS... Sensitive >=8 RESISTANT Resistant ERYTHROMYCIN >=8 RESISTANT Resistant >=8 RESISTANT Resistant GENTAMICIN <=0.5 SENSI... Sensitive <=0.5 SENSI... Sensitive Inducible Clindamycin NEGATIVE Sensitive NEGATIVE Sensitive OXACILLIN >=4 RESISTANT Resistant 2 SENSITIVE Sensitive RIFAMPIN  <=0.5 SENSI... Sensitive <=0.5 SENSI... Sensitive TETRACYCLINE <=1 SENSITIVE Sensitive <=1 SENSITIVE Sensitive TRIMETH/SULFA <=10 SENSIT Sensitive <=10 SENSIT Sensitive ... Marland Kitchen.. VANCOMYCIN 1 SENSITIVE Sensitive <=0.5 SENSI... Sensitive Right foot bone . Component 3 wk ago Specimen Description BONE Special Requests RIGHT 4 METATARSAL SAMPLE B Gram Stain NO WBC SEEN NO ORGANISMS SEEN Culture RARE METHICILLIN RESISTANT STAPHYLOCOCCUS AUREUS NO ANAEROBES ISOLATED Performed at [facility], [place] [address], [place], [place] [address] Report Status 10/08/2019 FINAL Organism ID, Bacteria METHICILLIN RESISTANT STAPHYLOCOCCUS AUREUS Resulting Agency [facility] Susceptibility Methicillin resistant staphylococcus aureus MIC CIPROFLOXACIN >=8 RESISTANT Resistant CLINDAMYCIN <=0.25 SENS... Sensitive ERYTHROMYCIN >=8 RESISTANT Resistant GENTAMICIN <=0.5 SENSI... Sensitive Inducible Clindamycin NEGATIVE Sensitive OXACILLIN >=4 RESISTANT Resistant RIFAMPIN <=0.5 SENSI... Sensitive TETRACYCLINE <=1 SENSITIVE Sensitive TRIMETH/SULFA <=10 SENSIT Sensitive ... VANCOMYCIN 1 SENSITIVE Sensitive This is a patient we had in clinic earlier this year with a wound over his left fifth metatarsal head. 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 Doran Durand on 05/16/2019. He seems to have developed problems with wounds on his bilateral feet in June 2021 although he may have had problems earlier than this. He was in an urgent care with a right foot ulcer on 09/26/2019 and given a course of doxycycline. This was apparently after having trouble getting into see orthopedics. He was seen by podiatry on 09/28/2019 noted to have bilateral  lower extremity ulcers including the left lateral fifth metatarsal base and the right subfifth met head. It was noted that had purulent drainage at that time. He required hospitalization from 6/20 through 7/2. This was because of worsening right foot wounds. He underwent bilateral operative incision and drainage and bone biopsies bilaterally. Culture results are listed above. He has been referred back to clinic by Dr. Jacqualyn Posey of podiatry. He is also followed by Dr. Megan Salon who saw him yesterday. He was discharged from hospital on Zyvox Flagyl and Levaquin and yesterday changed to doxycycline Flagyl and Levaquin. His inflammatory markers on 6/26 showed a sedimentation rate of 129 and a C-reactive protein of 5. This is improved to 14 and 1.3 respectively. This would indicate improvement. ABIs in our clinic today were 1.23 on the right and 1.20 on the left 11/01/2019 on evaluation today patient appears to be doing fairly well in regard to the wounds on his feet at this point. Fortunately there is no signs of active infection at this time. No fevers, chills, nausea, vomiting, or diarrhea. He currently is seeing infectious disease and still under their care at this point. Subsequently he also has both wounds which she has not been using collagen on as he did not receive that in his packaging he did not call us and let us know that. Apparently that just was missed on the order. Nonetheless we will get that straightened out today. 8/9-Patient returns for bilateral foot wounds, using Prisma with hydrogel moistened dressings, and the wounds appear stable. Patient using surgical shoes, avoiding much pressure or weightbearing as much as possible 8/16; patient has bilateral foot wounds. 1 on the right lateral foot proximally the other is on the left mid lateral foot. Both required debridement of callus and thick skin around the wounds. We have been using silver collagen 8/27; patient has bilateral lateral foot  wounds. The area on the left substantially surrounded by callus and dry skin. This was removed from the wound edge. The underlying wound is small. The area on the right measured somewhat smaller today. 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 Unfortunately I did not update his antibiotics during today's visit. 9/10 I reviewed Dr. Hale Bogus last notes he felt he had completed antibiotics his inflammatory markers were reasonably well controlled. He has a small wound on the lateral left foot and a tiny area on the right which is just above closed. He is using Hydrofera Blue with border foam he has bilateral surgical shoes 9/24; 2 week f/u. doing well. right foot is closed. left foot still undermined. 10/14; right foot remains closed at the fifth met head. The area over the base of the left fifth metatarsal has a small open area but considerable undermining towards the plantar foot. Thick callus skin around this suggests an adequate pressure  relief. We have talked about this. He says he is going to go back into his cam boot. I suggested a total contact cast he did not seem enamored with this suggestion 10/26; left foot base of the fifth metatarsal. Same condition as last time. He has skin over the area with an open wound however the skin is not adherent. He went to see Dr. Earleen Newport who did an x-ray and culture of his foot I have not reviewed the x-ray but the patient was not told anything. He is on doxycycline 11/11; since the patient was last here he was in the emergency room on 10/30 he was concerned about swelling in the left foot. They did not do any cultures or x-rays. They changed his antibiotics to cephalexin. Previous culture showed group B strep. The cephalexin is appropriate as doxycycline has less than predictable coverage. Arrives in clinic today with swelling over this area under the wound. He also has a new wound on the right  fifth metatarsal head 11/18; the patient has a difficult wound on the lateral aspect of the left fifth metatarsal head. The wound was almost ballotable last week I opened it slightly expecting to see purulence however there was just bleeding. I cultured this this was negative. X-ray unchanged. We are trying to get an MRI but I am not sure were going to be able to get this through his insurance. He also has an area on the right lateral fifth metatarsal head this looks healthier 12/3; the patient finally got our MRI. Surprisingly this did not show osteomyelitis. I did show the soft tissue ulceration at the lateral plantar aspect of the fifth metatarsal base with a tiny residual 6 mm abscess overlying the superficial fascia I have tried to culture this area I have not been able to get this to grow anything. Nevertheless the protruding tissue looks aggravated. I suspect we should try to treat the underlying "abscess with broad-spectrum antibiotics. I am going to start him on Levaquin and Flagyl. He has much less edema in his legs and I am going to continue to wrap his legs and see him weekly 12/10. I started Levaquin and Flagyl on him last week. He just picked up the Flagyl apparently there was some delay. The worry is the wound on the left fifth metatarsal base which is substantial and worsening. His foot looks like he inverts at the ankle making this a weightbearing surface. Certainly no improvement in fact I think the measurements of this are somewhat worse. We have been using 12/17; he apparently just got the Levaquin yesterday this is 2 weeks after the fact. He has completed the Flagyl. The area over the left fifth metatarsal base still has protruding granulation tissue although it does not look quite as bad as it did some weeks ago. He has severe bilateral lymphedema although we have not been treating him for wounds on his legs this is definitely going to require compression. There was so much edema  in the left I did not wish to put him in a total contact cast today. I am going to increase his compression from 3-4 layer. The area on the right lateral fifth met head actually look quite good and superficial. 12/23; patient arrived with callus on the right fifth met head and the substantial hyper granulated callused wound on the base of his fifth metatarsal. He says he is completing his Levaquin in 2 days but I do not think that adds up with what I gave  him but I will have to double check this. We are using Hydrofera Blue on both areas. My plan is to put the left leg in a cast the week after New Year's 04/06/2020; patient's wounds about the same. Right lateral fifth metatarsal head and left lateral foot over the base of the fifth metatarsal. There is undermining on the left lateral foot which I removed before application of total contact cast continuing with Hydrofera Blue new. Patient tells me he was seen by endocrinology today lab work was done [Dr. Kerr]. Also wondering whether he was referred to cardiology. I went over some lab work from previously does not have chronic renal failure certainly not nephrotic range proteinuria he does have very poorly controlled diabetes but this is not his most updated lab work. Hemoglobin A1c has been over 11 1/10; the patient had a considerable amount of leakage towards mid part of his left foot with macerated skin however the wound surface looks better the area on the right lateral fifth met head is better as well. I am going to change the dressing on the left foot under the total contact cast to silver alginate, continue with Hydrofera Blue on the right. 1/20; patient was in the total contact cast for 10 days. Considerable amount of drainage although the skin around the wound does not look too bad on the left foot. The area on the right fifth metatarsal head is closed. Our nursing staff reports large amount of drainage out of the left lateral foot wound 1/25;  continues with copious amounts of drainage described by our intake staff. PCR culture I did last week showed E. coli and Enterococcus faecalis and low quantities. Multiple resistance genes documented including extended spectrum beta lactamase, MRSA, MRSE, quinolone, tetracycline. The wound is not quite as good this week as it was 5 days ago but about the same size 2/3; continues with copious amounts of malodorous drainage per our intake nurse. The PCR culture I did 2 weeks ago showed E. coli and low quantities of Enterococcus. There were multiple resistance genes detected. I put Neosporin on him last week although this does not seem to have helped. The wound is slightly deeper today. Offloading continues to be an issue here although with the amount of drainage she has a total contact cast is just not going to work 2/10; moderate amount of drainage. Patient reports he cannot get his stocking on over the dressing. I told him we have to do that the nurse gave him suggestions on how to make this work. The wound is on the bottom and lateral part of his left foot. Is cultured predominantly grew low amounts of Enterococcus, E. coli and anaerobes. There were multiple resistance genes detected including extended spectrum beta lactamase, quinolone, tetracycline. I could not think of an easy oral combination to address this so for now I am going to do topical antibiotics provided by [facility] I think the main agents here are vancomycin and an aminoglycoside. We have to be able to give him access to the wounds to get the topical antibiotic on 2/17; moderate amount of drainage this is unchanged. He has his Keystone topical antibiotic against the deep tissue culture organisms. He has been using this and changing the dressing daily. Silver alginate on the wound surface. 2/24; using Keystone antibiotic with silver alginate on the top. He had too much drainage for a total contact cast at one point although I think that  is improving and I think in the next  week or 2 it might be possible to replace a total contact cast I did not do this today. In general the wound surface looks healthy however he continues to have thick rims of skin and subcutaneous tissue around the wide area of the circumference which I debrided 06/04/2020 upon evaluation today patient appears to be doing well in regard to his wound. I do feel like he is showing signs of improvement. There is little bit of callus and dead tissue around the edges of the wound as well as what appears to be a little bit of a sinus tract that is off to the side laterally I would perform debridement to clear that away today. 3/17; left lateral foot. The wound looks about the same as I remember. Not much depth surface looks healthy. No evidence of infection 3/25; left lateral foot. Wound surface looks about the same. Separating epithelium from the circumference. There really is no evidence of infection here however not making progress by my view 3/29; left lateral foot. Surface of the wound again looks reasonably healthy still thick skin and subcutaneous tissue around the wound margins. There is no evidence of infection. One of the concerns being brought up by the nurses has again the amount of drainage vis--vis continued use of a total contact cast 4/5; left lateral foot at roughly the base of the fifth metatarsal. Nice healthy looking granulated tissue with rims of epithelialization. The overall wound measurements are not any better but the tissue looks healthy. The only concern is the amount of drainage although he has no surrounding maceration with what we have been doing recently to absorb fluid and protect his skin. He also has lymphedema. 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 He states that the kerlix/coban under the TCC rolled down and could not get it back up. He has been using an offloading scooter and has somehow hurt his right foot using this device. This happened last week. He states that the side of his right foot developed a blister and opened. The top of his foot also has a few small open wounds he thinks is due to his socks rubbing in his shoes. He has not been using any dressings to the wound. He denies purulent drainage, fever/chills or erythema to the wounds. 4/22; patient presents for 1 week follow-up. He developed new wounds to the right foot that were evaluated at last clinic visit. He continues to have a total contact cast to the left leg and he reports no issues. He has been using silver collagen to the right foot wounds with no issues. He denies purulent drainage, fever/chills or erythema to the right foot wounds. He has no complaints today 4/25; patient presents for 1 week follow-up. He has a total contact cast of the left leg and reports no issues. He has been using silver alginate to the right foot wound. He denies purulent drainage, fever/chills or erythema to the right foot wounds. 5/2 patient presents for 1 week follow-up. T contact cast on the left. The wound which is on the base of the plantar foot at the base of the fifth metatarsal otal actually looks quite good and dimensions continue to gradually contract. HOWEVER the area on the right  lateral fifth metatarsal head is much larger than what I remember from 2 weeks ago. Once more is he has significant levels of hypergranulation. Noteworthy that he had this same hyper granulated response on his wound on the left foot at one point in time. So much so that he I thought there was an underlying fluid collection. Based on this I think this just needs debridement. 5/9; the wound on the left actually continues to be gradually smaller  with a healthy surface. Slight amount of drainage and maceration of the skin around but not too bad. However he has a large wound over the right fifth metatarsal head very much in the same configuration as his left foot wound was initially. I used silver nitrate to address the hyper granulated tissue no mechanical debridement 5/16; area on the left foot did not look as healthy this week deeper thick surrounding macerated skin and subcutaneous tissue. The area on the right foot fifth met head was about the same The area on the right ankle that we identified last week is completely broken down into an open wound presumably a stocking rubbing issue 5/23; patient has been using a total contact cast to the left side. He has been using silver alginate underneath. He has also been using silver alginate to the right foot wounds. He has no complaints today. He denies any signs of infection. 5/31; the left-sided wound looks some better measure smaller surface granulation looks better. We have been using silver alginate under the total contact cast The large area on his right fifth met head and right dorsal foot look about the same still using silver alginate 6/6; neither side is good as I was hoping although the surface area dimensions are better. A lot of maceration on his left and right foot around the wound edge. Area on the dorsal right foot looks better. He says he was traveling. I am not sure what does the amount of maceration around the plantar wounds may be drainage issues 6/13; in general the wound surfaces look quite good on both sides. Macerated skin and raised edges around the wound required debridement although in general especially on the left the surface area seems improved. The area on the right dorsal ankle is about the same I thought this would not be such a problem to close 6/20; not much change in either wound although the one on the right looks a little better. Both wounds have thick  macerated edges to the skin requiring debridements. We have been using silver alginate. The area on the dorsal right ankle is still open I thought this would be closed. 6/28; patient comes in today with a marked deterioration in the right foot wound fifth met head. Wide area of exposed bone this is a drastic change from last time. The area on the left there we have been casting is stagnant. We have been using silver alginate in both wound areas. 7/5; bone culture I did for PCR last time was positive for Pseudomonas, group B strep, Enterococcus and Staph aureus. There was no suggestion of methicillin resistance or ampicillin resistant genes. This was resistant to tetracycline however He comes into the clinic today with the area over his right plantar fifth metatarsal head which had been doing so well 2 weeks ago completely necrotic feeling bone. I do not know that this is going to be salvageable. The left foot wound is certainly no smaller but it has a better surface and is superficial. 7/8; patient called in  this morning to say that his total contact cast was rubbing against his foot. He states he is doing fine overall. He denies signs of infection. 7/12; continued deterioration in the wound over the right fifth metatarsal head crumbling bone. This is not going to be salvageable. The patient agrees and wants to be referred to Dr. Doran Durand which we will attempt to arrange as soon as possible. I am going to continue him on antibiotics as long as that takes so I will renew those today. The area on the left foot which is the base of the fifth metatarsal continues to look somewhat better. Healthy looking tissue no depth no debridement is necessary here. 7/20; the patient was kindly seen by Dr. Doran Durand of orthopedics on 10/19/2020. 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 Towards this end we have taken him out of  the total contact cast on the left we will put him in a wrap with Hydrofera Blue. As I understand things surgery is planned for 7/21 7/27; patient had his surgery last Thursday. 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 Apparently everything went well we did not still disturb that today The area on the left foot actually looks quite good. He has been much less mobile which probably explains this he did not seem to do well in the total contact cast secondary to drainage and maceration I think. 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 Fortunately there is no evidence of active infection at this time. No fevers, chills, nausea, vomiting, or diarrhea. Overall I think that he is actually doing extremely well. Nonetheless I do believe that he is staying off of this more following the surgery in his right foot that is the reason the left is doing so great. 8/16; left plantar foot wound. This looks smaller than the last time I saw this he is using Hydrofera Blue. The surgical wound on the right foot is being followed by Dr. Doran Durand we did not look at this today. He has surgical shoes on both feet 8/23; left plantar foot wound not as good this week. Surrounding macerated skin and subcutaneous tissue everything looks moist and wet. I do not think he is offloading this adequately. He is using a surgical shoe Apparently the right foot surgical wound is not open although I did not check his foot 8/31; left plantar foot lateral aspect. Much improved this week. He has no maceration. Some improvement in the surface area of the wound but most impressively the depth is come in we are using silver alginate. The patient is a Product/process development scientist. He is asked that we write him a letter so he can go back to work. I have also tried to see if we can write something that will allow him to limit the amount of time  that he is on his foot at work. 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 Hopefully the latter can be avoided 9/6; I believe the patient missed an appointment last week. He arrives in today with a wound looking roughly the same certainly no better. Undermining laterally and also inferiorly. We used molecuLight today in training with the patient's permission.. We are using  silver alginate 9/21 wound is measuring bigger this week although this may have to do with the aggressive circumferential debridement last week in response to the blush fluorescence on the MolecuLight. Culture I did last week showed significant MSSA and E. coli. I put him on Augmentin but he has not started it yet. We are also going to send this for compounded antibiotics at [facility]. There is no evidence of systemic infection 9/29; silver alginate. His Keystone arrived. He is completing Augmentin in 2 days. Offloading in a cam boot. Moderate drainage per our intake staff 10/5; using silver alginate. He has been using his [place]. He has completed his Augmentin. Per our intake nurse still a lot of drainage, far too much to consider a total contact cast. Wound measures about the same. He had the same undermining area that I defined last week from a roughly 11-3. I remove this today 10/12; using silver alginate he is using the [place]. He comes in for a nurse visit hence we are applying [employer] twice a week. Measuring slightly better today and less notable drainage. Extensive debridement of the wound edge last time 10/18; using topical Keystone and silver alginate and a soft cast. Wound measurements about the same. Drainage was through his soft cast. We are changing this twice a week Tuesdays and Friday 10/25; comes in with moderate drainage. Still using Keystone silver alginate and a soft cast. Wound dimensions completely the same.He has a lot of edema  in the left leg he has lymphedema. Asking for [name] to consider wrapping him as he cannot get his stocking on over the soft cast 11/2; comes in with moderate to large drainage slightly smaller in terms of width we have been using [place]. His wound looks satisfactory but not much improvement 11/4; patient presents today for obligatory cast change. Has no issues or complaints today. He denies signs of infection. 11/9; patient traveled this weekend to [place], was on the cast quite a bit. Staining of the cast with black material from his walking boot. Drainage was not quite as bad as we feared. Using silver alginate and Keystone 11/16; we do not have size for cast therefore we have been putting a soft cast on him since the change on Friday. Still a significant amount of drainage necessitating changing twice a week. We have been using the Keystone at cast changes either hard or soft as well as silver alginate Comes in the clinic with things actually looking fairly good improvement in width. He says his offloading is about the same 02/24/2021 upon evaluation today patient actually comes back in and is doing excellent in regard to his foot ulcer this is significantly smaller even compared to the last visit. The soft cast seems to have done extremely well for him which is great news. I do not see any signs of infection minimal debridement will be needed today. 11/30; left lateral foot much improved half a centimeter improvement in surface area. No evidence of infection. He seems to be doing better with the soft cast in the TCC therefore we will continue with this. He comes back in later in the week for a change with the nurses. This is due to drainage 12/6; no improvement in dimensions. Under illumination some debris on the surface we have been using silver alginate, soft cast. If there is anything optimistic here he seems to have have less drainage 12/13. Dimensions are improved both length and width and  slightly in depth. Appears to be quite healthy today.  Raised edges of this thick skin and callus around the edges however. He is in a soft cast were bringing him back once for a change on Friday. Drainage is better Electronic Signature(s) Signed: 03/16/2021 4:30:32 PM By: [name] MD Entered By: [name] on 03/16/2021 08:47:34 -------------------------------------------------------------------------------- Soft Cast Details Patient Name: Date of Service: [name], [name] 03/16/2021 7:30 A M Medical Record Number: [id number] Patient Account Number: [id number] Date of Birth/Sex: Treating RN: 1985-08-08 (35 y.o. [name] Primary Care Provider: [name] Other Clinician: Referring Provider: Treating Provider/Extender: [name] in Treatment: 72 Procedure Performed for: Wound #3 Left,Lateral Foot Performed By: Clinician [name], RN Post Procedure Diagnosis Same as Pre-procedure Electronic Signature(s) Signed: 03/16/2021 4:30:32 PM By: [name] MD Signed: 03/16/2021 5:08:15 PM By: [name] Entered By: [name] on 03/16/2021 08:26:12 -------------------------------------------------------------------------------- Physical Exam Details Patient Name: Date of Service: [name], [name] 03/16/2021 7:30 A M Medical Record Number: [id number] Patient Account Number: [id number] Date of Birth/Sex: Treating RN: June 24, 1985 (35 y.o. [name] Primary Care Provider: [name] Other Clinician: Referring Provider: Treating Provider/Extender: [name] in Treatment: 72 Constitutional Patient is hypertensive.. Pulse regular and within target range for patient.Marland Kitchen Respirations regular, non-labored and within target range.. Temperature is normal and within the target range for the patient.Marland Kitchen Appears in no distress. Notes Wound exam; the wound is smaller. Surface of the wound looks  healthy. Once again he has callus and thick skin around the edges of most of this wound I used a #5 curette to try and bring this down to the level of the wound. There is no evidence of surrounding infection Electronic Signature(s) Signed: 03/16/2021 4:30:32 PM By: [name] MD Signed: 03/16/2021 4:30:32 PM By: [name] MD Entered By: [name] on 03/16/2021 49:70:26 -------------------------------------------------------------------------------- Physician Orders Details Patient Name: Date of Service: [name], [name] 03/16/2021 7:30 A M Medical Record Number: [id number] Patient Account Number: [id number] Date of Birth/Sex: Treating RN: [birth year] (35 y.o. [name] Primary Care Provider: [name] Other Clinician: Referring Provider: Treating Provider/Extender: [name] in Treatment: 30 Verbal / Phone Orders: No Diagnosis Coding ICD-10 Coding Code Description E11.621 Type 2 diabetes mellitus with foot ulcer L97.528 Non-pressure chronic ulcer of other part of left foot with other specified severity Follow-up Appointments ppointment in 1 week. - Dr. Dellia Nims Return A Nurse Visit: - Friday 03/19/21 to change soft cast Bathing/ Shower/ Hygiene May shower with protection but do not get wound dressing(s) wet. Edema Control - Lymphedema / SCD / Other Bilateral Lower Extremities Elevate legs to the level of the heart or above for 30 minutes daily and/or when sitting, a frequency of: - throughout the day Avoid standing for long periods of time. Exercise regularly Moisturize legs daily. - right leg every night before bed. Compression stocking or Garment 20-30 mm/Hg pressure to: - Apply to right leg in the morning and remove at night. Off-Loading Other: - minimal weight bearing left foot Other: - Soft cast to left foot - wrap leg with kerlix/coban to reduce swelling Additional Orders / Instructions Follow Nutritious  Diet Wound Treatment Wound #3 - Foot Wound Laterality: Left, Lateral Cleanser: Soap and Water 2 x Per Week/30 Days Discharge Instructions: May shower and wash wound with dial antibacterial soap and water prior to dressing change. Cleanser: Wound Cleanser (Generic) 2 x Per Week/30 Days Discharge Instructions: Cleanse the wound with wound cleanser prior to applying  a clean dressing using gauze sponges, not tissue or cotton balls. Peri-Wound Care: Zinc Oxide Ointment 30g tube 2 x Per Week/30 Days Discharge Instructions: Apply Zinc Oxide to periwound with each dressing change as needed. Topical: keystone antibiotic compound 2 x Per Week/30 Days Discharge Instructions: thin layer to wound bed Prim Dressing: KerraCel Ag Gelling Fiber Dressing, 4x5 in (silver alginate) (Generic) 2 x Per Week/30 Days ary Discharge Instructions: Apply silver alginate to wound bed as instructed Secondary Dressing: ABD Pad, 8x10 2 x Per Week/30 Days Discharge Instructions: Apply over primary dressing as directed. Secondary Dressing: Zetuvit Plus 4x8 in (Generic) 2 x Per Week/30 Days Discharge Instructions: Apply over primary dressing as directed. Compression Wrap: Kerlix Roll 4.5x3.1 (in/yd) 2 x Per Week/30 Days Discharge Instructions: Apply Kerlix and Coban compression as directed. Compression Wrap: Coban Self-Adherent Wrap 4x5 (in/yd) 2 x Per Week/30 Days Discharge Instructions: Apply over Kerlix as directed. Electronic Signature(s) Signed: 03/16/2021 4:30:32 PM By: [name] MD Signed: 03/16/2021 5:08:15 PM By: [name] Entered By: [name] on 03/16/2021 08:29:02 -------------------------------------------------------------------------------- Problem List Details Patient Name: Date of Service: [name], [name] 03/16/2021 7:30 A M Medical Record Number: [id number] Patient Account Number: [id number] Date of Birth/Sex: Treating RN: January 12, 1986 (35 y.o. [name] Primary Care Provider:  [name] Other Clinician: Referring Provider: Treating Provider/Extender: [name] in Treatment: 72 Active Problems ICD-10 Encounter Code Description Active Date MDM Diagnosis E11.621 Type 2 diabetes mellitus with foot ulcer 10/24/2019 No Yes L97.528 Non-pressure chronic ulcer of other part of left foot with other specified 10/24/2019 No Yes severity Inactive Problems ICD-10 Code Description Active Date Inactive Date L97.518 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ity 10/24/2019 10/24/2019 L97.518 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ity 07/14/2020 07/14/2020 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, right ankle and foot 10/24/2019 10/24/2019 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 08/10/2020 08/10/2020 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 He comes in for review of a wound over the plantar fifth metatarsal  head extending into the lateral part of the foot. He was followed for this previously by his podiatrist Dr. Cornelius Moras. As the patient tells his story he went to see podiatry first for a swelling he developed on the lateral part of his fifth metatarsal head in May. He states this was "open" by podiatry and the area closed. He was followed up in June and it was again opened callus removed and it closed promptly. There were plans being made for surgery on the fifth metatarsal head in June however his blood sugar was apparently too high for anesthesia. Apparently the area was debrided and opened again in June and it is never closed since. Looking over the records from podiatry  I am really not able to follow this. It was clear when he was first seen it was before 5/14 at that point he already had a wound. By 5/17 the ulcer was resolved. I do not see anything about a procedure. On 5/28 noted to have pre-ulcerative moderate keratosis. X-ray noted 1/5 contracted toe and tailor's bunion and metatarsal deformity. On a visit date on 09/28/2018 the dorsal part of the left foot it healed and resolved. There was concern about swelling in his lower extremity he was sent to the ER.. As far as I can tell he was seen in the ER on 7/12 with an ulcer on his left foot. A DVT rule out of the left leg was negative. I do not think I have complete records from podiatry but I am not able to verify the procedures this patient states he had. He states after the last procedure the wound has never closed although I am not able to follow this in the records I have from podiatry. He has not had a recent x-ray The patient has been using Neosporin on the wound. He is wearing a [place] shoe. He is still very active up on his foot working and exercising. Past medical history; type 2 diabetes ketosis-prone, leg swelling with a negative DVT study in July. Non-smoker ABI in our clinic was 0.85 on the left 10/16; substantial wound on the plantar  left fifth met head extending laterally almost to the dorsal fifth MTP. We have been using silver alginate we gave him a Dar[employer] forefoot off loader. An x-ray did not show evidence of osteomyelitis did note soft tissue emphysema which I think was due to gas tracking through an open wound. There is no doubt in my mind he requires an MRI 10/23; MRI not booked until 3 November at the earliest this is largely due to his glucose sensor in the right arm. We have been using silver alginate. There has been an improvement 10/29; I am still not exactly sure when his MRI is booked for. He says it is the third but it is the 10th in epic. This definitely needs to be done. He is running a low-grade fever today but no other symptoms. No real improvement in the 1 02/26/2019 patient presents today for a follow-up visit here in our clinic he is last been seen in the clinic on October 29. Subsequently we were working on getting MRI to evaluate and see what exactly was going on and where we would need to go from the standpoint of whether or not he had osteomyelitis and again what treatments were going be required. Subsequently the patient ended up being admitted to the hospital on 02/07/2019 and was discharged on 02/14/2019. This is a somewhat interesting admission with a discharge diagnosis of pneumonia due to COVID-19 although he was positive for COVID-19 when tested at the urgent care but negative x2 when he was actually in the hospital. With that being said he did have acute respiratory failure with hypoxia and it was noted he also have a left foot ulceration with osteomyelitis. With that being said he did require oxygen for his pneumonia and I level 4 L. He was placed on antivirals and steroids for the COVID-19. He was also transferred to the [facility] at one point. Nonetheless he did subsequently discharged home and since being home has done much better in that regard. The CT angiogram did not show any  pulmonary embolism. With regard to the osteomyelitis the patient  was placed on vancomycin and Zosyn while in the hospital but has been changed to Augmentin at discharge. It was also recommended that he follow- up with wound care and podiatry. Podiatry however wanted him to see [name] according to the patient prior to them doing anything further. His hemoglobin A1c was 9.9 as noted in the hospital. Have an MRI of the left foot performed while in the hospital on 02/04/2019. 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 There is an overlying plantar open wound noted an abscess tracking back along the lateral aspect of the fifth metatarsal shaft. There is otherwise diffuse cellulitis and mild fasciitis without findings of polymyositis. The patient did have recently pneumonia secondary to COVID-19 I looked in the chart through epic and it does appear that the patient may need to have an additional x-ray just to ensure everything is cleared and that he has no airspace disease prior to putting him into the [name]. 03/05/2019; patient was readmitted to the clinic last week. He was hospitalized twice for a viral upper respiratory tract infection from 11/1 through 11/4 and then 11/5 through 11/12 ultimately this turned out to be Covid pneumonitis. Although he was discharged on oxygen he is not using it. He says he feels fine. He has no exercise limitation no cough no sputum. His O2 sat in our clinic today was 100% on room air. 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 He received Vanco and Zosyn in the hospital and then was discharged on 2 weeks of Augmentin. I do not see any relevant cultures. He was supposed to follow-up with infectious disease but I do not see that he has an appointment. 12/8; patient saw  Dr. Novella Olive of infectious disease last week. He felt that he had had adequate antibiotic therapy. He did not go to follow-up with Dr. Amalia Hailey of podiatry and I have again talked to him about the pros and cons of this. He does not want to consider a ray amputation of this time. He is aware of the risks of recurrence, migration etc. He started HBO today and tolerated this well. He can complete the Augmentin that I gave him last week. I have looked over the lab work that Dr. Chana Bode ordered his C-reactive protein was 3.3 and his sedimentation rate was 17. The C-reactive protein is never really been measurably that high in this patient 12/15; not much change in the wound today however he has undermining along the lateral part of the foot again more extensively than last week. He has some rims of epithelialization. We have been using silver alginate. He is undergoing hyperbarics but did not dive today 12/18; in for his obligatory first total contact cast change. Unfortunately there was pus coming from the undermining area around his fifth metatarsal head. This was cultured but will preclude reapplication of a cast. He is seen in conjunction with HBO 12/24; patient had staph lugdunensis in the wound in the undermining area laterally last time. We put him on doxycycline which should have covered this. The wound looks better today. I am going to give him another week of doxycycline before reattempting the total contact cast 12/31; the patient is completing antibiotics. Hemorrhagic debris in the distal part of the wound with some undermining distally. He also had hyper granulation. Extensive debridement with a #  5 curette. The infected area that was on the lateral part of the fifth met head is closed over. I do not think he needs any more antibiotics. Patient was seen prior to HBO. Preparations for a total contact cast were made in the cast will be placed post hyperbarics 04/11/19; once again the patient arrives  today without complaint. He had been in a cast all week noted that he had heavy drainage this week. This resulted in large raised areas of macerated tissue around the wound 1/14; wound bed looks better slightly smaller. Hydrofera Blue has been changing himself. He had a heavy drainage last week which caused a lot of maceration around the wound so I took him out of a total contact cast he says the drainage is actually better this week He is seen today in conjunction with HBO 1/21; returns to clinic. He was up in [place] for a day or 2 attending a funeral. He comes back in with the wound larger and with a large area of exposed bone. He had osteomyelitis and septic arthritis of the fifth left metatarsal head while he was in hospital. He received IV antibiotics in the hospital for a prolonged period of time then 3 weeks of Augmentin. Subsequently I gave him 2 weeks of doxycycline for more superficial wound infection. When I saw this last week the wound was smaller the surface of the wound looks satisfactory. 1/28; patient missed hyperbarics today. Bone biopsy I did last time showed Enterococcus faecalis and Staphylococcus lugdunensis . He has a wide area of exposed bone. We are going to use silver alginate as of today. I had another ethical discussion with the patient. 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 In this situation I think the likelihood of healing this is low. Therefore I have recommended a ray amputation and with the patient's agreement I have referred him to Dr. Doran Durand. The other issue is that his compliance with hyperbarics has been minimal because of his work schedule and given his underlying decision I am going to stop this today READMISSION 10/24/2019 MRI 09/29/2019 left foot IMPRESSION: 1. Apparent skin ulceration inferior and lateral to the 5th metatarsal base with underlying heterogeneous T2 signal and enhancement in the subcutaneous fat.  Small peripherally enhancing fluid collections along the plantar and lateral aspects of the 5th metatarsal base suspicious for abscesses. 2. Interval amputation through the mid 5th metatarsal with nonspecific low-level marrow edema and enhancement. Given the proximity to the adjacent soft tissue inflammatory changes, osteomyelitis cannot be excluded. 3. The additional bones appear unremarkable. MRI 09/29/2019 right foot IMPRESSION: 1. Soft tissue ulceration lateral to the 5th MTP joint. There is low-level T2 hyperintensity within the 4th and 5th metatarsal heads and adjacent proximal phalanges without abnormal T1 signal or cortical destruction. These findings are nonspecific and could be seen with early marrow edema, hyperemia or early osteomyelitis. No evidence of septic joint. 2. Mild tenosynovitis and synovial enhancement associated with the extensor digitorum tendons at the level of the midfoot. 3. Diffuse low-level muscular T2 hyperintensity and enhancement, most consistent with diabetic myopathy. LEFT FOOT BONE Methicillin resistant staphylococcus aureus Staphylococcus lugdunensis MIC MIC CIPROFLOXACIN >=8 RESISTANT Resistant <=0.5 SENSI... Sensitive CLINDAMYCIN <=0.25 SENS... Sensitive >=8 RESISTANT Resistant ERYTHROMYCIN >=8 RESISTANT Resistant >=8 RESISTANT Resistant GENTAMICIN <=0.5 SENSI... Sensitive <=0.5 SENSI... Sensitive Inducible Clindamycin NEGATIVE Sensitive NEGATIVE Sensitive OXACILLIN >=4 RESISTANT Resistant 2 SENSITIVE Sensitive RIFAMPIN <=0.5 SENSI... Sensitive <=0.5 SENSI... Sensitive TETRACYCLINE <=1 SENSITIVE Sensitive <=1 SENSITIVE Sensitive TRIMETH/SULFA <=10  SENSIT Sensitive <=10 SENSIT Sensitive ... Marland Kitchen.. VANCOMYCIN 1 SENSITIVE Sensitive <=0.5 SENSI... Sensitive Right foot bone . Component 3 wk ago Specimen Description BONE Special Requests RIGHT 4 METATARSAL SAMPLE B Gram Stain NO WBC SEEN NO ORGANISMS SEEN Culture RARE METHICILLIN RESISTANT  STAPHYLOCOCCUS AUREUS NO ANAEROBES ISOLATED Performed at [facility], [place] [address] [place], [place] [address] Report Status 10/08/2019 FINAL Organism ID, Bacteria METHICILLIN RESISTANT STAPHYLOCOCCUS AUREUS Resulting Agency [facility] Susceptibility Methicillin resistant staphylococcus aureus MIC CIPROFLOXACIN >=8 RESISTANT Resistant CLINDAMYCIN <=0.25 SENS... Sensitive ERYTHROMYCIN >=8 RESISTANT Resistant GENTAMICIN <=0.5 SENSI... Sensitive Inducible Clindamycin NEGATIVE Sensitive OXACILLIN >=4 RESISTANT Resistant RIFAMPIN <=0.5 SENSI... Sensitive TETRACYCLINE <=1 SENSITIVE Sensitive TRIMETH/SULFA <=10 SENSIT Sensitive ... VANCOMYCIN 1 SENSITIVE Sensitive This is a patient we had in clinic earlier this year with a wound over his left fifth metatarsal head. 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 Doran Durand on 05/16/2019. He seems to have developed problems with wounds on his bilateral feet in June 2021 although he may have had problems earlier than this. He was in an urgent care with a right foot ulcer on 09/26/2019 and given a course of doxycycline. This was apparently after having trouble getting into see orthopedics. He was seen by podiatry on 09/28/2019 noted to have bilateral lower extremity ulcers including the left lateral fifth metatarsal base and the right subfifth met head. It was noted that had purulent drainage at that time. He required hospitalization from 6/20 through 7/2. This was because of worsening right foot wounds. He underwent bilateral operative incision and drainage and bone biopsies bilaterally. Culture results are listed above. He has been referred back to clinic by Dr. Jacqualyn Posey  of podiatry. He is also followed by Dr. Megan Salon who saw him yesterday. He was discharged from hospital on Zyvox Flagyl and Levaquin and yesterday changed to doxycycline Flagyl and Levaquin. His inflammatory markers on 6/26 showed a sedimentation rate of 129 and a C-reactive protein of 5. This is improved to 14 and 1.3 respectively. This would indicate improvement. ABIs in our clinic today were 1.23 on the right and 1.20 on the left 11/01/2019 on evaluation today patient appears to be doing fairly well in regard to the wounds on his feet at this point. Fortunately there is no signs of active infection at this time. No fevers, chills, nausea, vomiting, or diarrhea. He currently is seeing infectious disease and still under their care at this point. Subsequently he also has both wounds which she has not been using collagen on as he did not receive that in his packaging he did not call us and let us know that. Apparently that just was missed on the order. Nonetheless we will get that straightened out today. 8/9-Patient returns for bilateral foot wounds, using Prisma with hydrogel moistened dressings, and the wounds appear stable. Patient using surgical shoes, avoiding much pressure or weightbearing as much as possible 8/16; patient has bilateral foot wounds. 1 on the right lateral foot proximally the other is on the left mid lateral foot. Both required debridement of callus and thick skin around the wounds. We have been using silver collagen 8/27; patient has bilateral lateral foot wounds. The area on the left substantially surrounded by callus and dry skin. This was  removed from the wound edge. The underlying wound is small. The area on the right measured somewhat smaller today. 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 Unfortunately I did not update his antibiotics during today's visit. 9/10 I reviewed Dr. Hale Bogus last notes he felt  he had completed antibiotics his inflammatory markers were reasonably well controlled. He has a small wound on the lateral left foot and a tiny area on the right which is just above closed. He is using Hydrofera Blue with border foam he has bilateral surgical shoes 9/24; 2 week f/u. doing well. right foot is closed. left foot still undermined. 10/14; right foot remains closed at the fifth met head. The area over the base of the left fifth metatarsal has a small open area but considerable undermining towards the plantar foot. Thick callus skin around this suggests an adequate pressure relief. We have talked about this. He says he is going to go back into his cam boot. I suggested a total contact cast he did not seem enamored with this suggestion 10/26; left foot base of the fifth metatarsal. Same condition as last time. He has skin over the area with an open wound however the skin is not adherent. He went to see Dr. Earleen Newport who did an x-ray and culture of his foot I have not reviewed the x-ray but the patient was not told anything. He is on doxycycline 11/11; since the patient was last here he was in the emergency room on 10/30 he was concerned about swelling in the left foot. They did not do any cultures or x-rays. They changed his antibiotics to cephalexin. Previous culture showed group B strep. The cephalexin is appropriate as doxycycline has less than predictable coverage. Arrives in clinic today with swelling over this area under the wound. He also has a new wound on the right fifth metatarsal head 11/18; the patient has a difficult wound on the lateral aspect of the left fifth metatarsal head. The wound was almost ballotable last week I opened it slightly expecting to see purulence however there was just bleeding. I cultured this this was negative. X-ray unchanged. We are trying to get an MRI but I am not sure were going to be able to get this through his insurance. He also has an area on the right  lateral fifth metatarsal head this looks healthier 12/3; the patient finally got our MRI. Surprisingly this did not show osteomyelitis. I did show the soft tissue ulceration at the lateral plantar aspect of the fifth metatarsal base with a tiny residual 6 mm abscess overlying the superficial fascia I have tried to culture this area I have not been able to get this to grow anything. Nevertheless the protruding tissue looks aggravated. I suspect we should try to treat the underlying "abscess with broad-spectrum antibiotics. I am going to start him on Levaquin and Flagyl. He has much less edema in his legs and I am going to continue to wrap his legs and see him weekly 12/10. I started Levaquin and Flagyl on him last week. He just picked up the Flagyl apparently there was some delay. The worry is the wound on the left fifth metatarsal base which is substantial and worsening. His foot looks like he inverts at the ankle making this a weightbearing surface. Certainly no improvement in fact I think the measurements of this are somewhat worse. We have been using 12/17; he apparently just got the Levaquin yesterday  this is 2 weeks after the fact. He has completed the Flagyl. The area over the left fifth metatarsal base still has protruding granulation tissue although it does not look quite as bad as it did some weeks ago. He has severe bilateral lymphedema although we have not been treating him for wounds on his legs this is definitely going to require compression. There was so much edema in the left I did not wish to put him in a total contact cast today. I am going to increase his compression from 3-4 layer. The area on the right lateral fifth met head actually look quite good and superficial. 12/23; patient arrived with callus on the right fifth met head and the substantial hyper granulated callused wound on the base of his fifth metatarsal. He says he is completing his Levaquin in 2 days but I do not think  that adds up with what I gave him but I will have to double check this. We are using Hydrofera Blue on both areas. My plan is to put the left leg in a cast the week after New Year's 04/06/2020; patient's wounds about the same. Right lateral fifth metatarsal head and left lateral foot over the base of the fifth metatarsal. There is undermining on the left lateral foot which I removed before application of total contact cast continuing with Hydrofera Blue new. Patient tells me he was seen by endocrinology today lab work was done [Dr. Kerr]. Also wondering whether he was referred to cardiology. I went over some lab work from previously does not have chronic renal failure certainly not nephrotic range proteinuria he does have very poorly controlled diabetes but this is not his most updated lab work. Hemoglobin A1c has been over 11 1/10; the patient had a considerable amount of leakage towards mid part of his left foot with macerated skin however the wound surface looks better the area on the right lateral fifth met head is better as well. I am going to change the dressing on the left foot under the total contact cast to silver alginate, continue with Hydrofera Blue on the right. 1/20; patient was in the total contact cast for 10 days. Considerable amount of drainage although the skin around the wound does not look too bad on the left foot. The area on the right fifth metatarsal head is closed. Our nursing staff reports large amount of drainage out of the left lateral foot wound 1/25; continues with copious amounts of drainage described by our intake staff. PCR culture I did last week showed E. coli and Enterococcus faecalis and low quantities. Multiple resistance genes documented including extended spectrum beta lactamase, MRSA, MRSE, quinolone, tetracycline. The wound is not quite as good this week as it was 5 days ago but about the same size 2/3; continues with copious amounts of malodorous drainage per  our intake nurse. The PCR culture I did 2 weeks ago showed E. coli and low quantities of Enterococcus. There were multiple resistance genes detected. I put Neosporin on him last week although this does not seem to have helped. The wound is slightly deeper today. Offloading continues to be an issue here although with the amount of drainage she has a total contact cast is just not going to work 2/10; moderate amount of drainage. Patient reports he cannot get his stocking on over the dressing. I told him we have to do that the nurse gave him suggestions on how to make this work. The wound is on the bottom and  lateral part of his left foot. Is cultured predominantly grew low amounts of Enterococcus, E. coli and anaerobes. There were multiple resistance genes detected including extended spectrum beta lactamase, quinolone, tetracycline. I could not think of an easy oral combination to address this so for now I am going to do topical antibiotics provided by [facility] I think the main agents here are vancomycin and an aminoglycoside. We have to be able to give him access to the wounds to get the topical antibiotic on 2/17; moderate amount of drainage this is unchanged. He has his Keystone topical antibiotic against the deep tissue culture organisms. He has been using this and changing the dressing daily. Silver alginate on the wound surface. 2/24; using Keystone antibiotic with silver alginate on the top. He had too much drainage for a total contact cast at one point although I think that is improving and I think in the next week or 2 it might be possible to replace a total contact cast I did not do this today. In general the wound surface looks healthy however he continues to have thick rims of skin and subcutaneous tissue around the wide area of the circumference which I debrided 06/04/2020 upon evaluation today patient appears to be doing well in regard to his wound. I do feel like he is showing signs of  improvement. There is little bit of callus and dead tissue around the edges of the wound as well as what appears to be a little bit of a sinus tract that is off to the side laterally I would perform debridement to clear that away today. 3/17; left lateral foot. The wound looks about the same as I remember. Not much depth surface looks healthy. No evidence of infection 3/25; left lateral foot. Wound surface looks about the same. Separating epithelium from the circumference. There really is no evidence of infection here however not making progress by my view 3/29; left lateral foot. Surface of the wound again looks reasonably healthy still thick skin and subcutaneous tissue around the wound margins. There is no evidence of infection. One of the concerns being brought up by the nurses has again the amount of drainage vis--vis continued use of a total contact cast 4/5; left lateral foot at roughly the base of the fifth metatarsal. Nice healthy looking granulated tissue with rims of epithelialization. The overall wound measurements are not any better but the tissue looks healthy. The only concern is the amount of drainage although he has no surrounding maceration with what we have been doing recently to absorb fluid and protect his skin. He also has lymphedema. 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 He states that the kerlix/coban under the TCC rolled down and could not get it back up. He has been using an offloading scooter and has somehow hurt his right foot using this device. This happened last week. He states that the side of his right foot developed a blister and opened. The top of his foot also has a few small open wounds he thinks is due to his  socks rubbing in his shoes. He has not been using any dressings to the wound. He denies purulent drainage, fever/chills or erythema to the wounds. 4/22; patient presents for 1  week follow-up. He developed new wounds to the right foot that were evaluated at last clinic visit. He continues to have a total contact cast to the left leg and he reports no issues. He has been using silver collagen to the right foot wounds with no issues. He denies purulent drainage, fever/chills or erythema to the right foot wounds. He has no complaints today 4/25; patient presents for 1 week follow-up. He has a total contact cast of the left leg and reports no issues. He has been using silver alginate to the right foot wound. He denies purulent drainage, fever/chills or erythema to the right foot wounds. 5/2 patient presents for 1 week follow-up. T contact cast on the left. The wound which is on the base of the plantar foot at the base of the fifth metatarsal otal actually looks quite good and dimensions continue to gradually contract. HOWEVER the area on the right lateral fifth metatarsal head is much larger than what I remember from 2 weeks ago. Once more is he has significant levels of hypergranulation. Noteworthy that he had this same hyper granulated response on his wound on the left foot at one point in time. So much so that he I thought there was an underlying fluid collection. Based on this I think this just needs debridement. 5/9; the wound on the left actually continues to be gradually smaller with a healthy surface. Slight amount of drainage and maceration of the skin around but not too bad. However he has a large wound over the right fifth metatarsal head very much in the same configuration as his left foot wound was initially. I used silver nitrate to address the hyper granulated tissue no mechanical debridement 5/16; area on the left foot did not look as healthy this week deeper thick surrounding macerated  skin and subcutaneous tissue. oo The area on the right foot fifth met head was about the same oo The area on the right ankle that we identified last week is completely broken down into an open wound presumably a stocking rubbing issue 5/23; patient has been using a total contact cast to the left side. He has been using silver alginate underneath. He has also been using silver alginate to the right foot wounds. He has no complaints today. He denies any signs of infection. 5/31; the left-sided wound looks some better measure smaller surface granulation looks better. We have been using silver alginate under the total contact cast oo The large area on his right fifth met head and right dorsal foot look about the same still using silver alginate 6/6; neither side is good as I was hoping although the surface area dimensions are better. A lot of maceration on his left and right foot around the wound edge. Area on the dorsal right foot looks better. He says he was traveling. I am not sure what does the amount of maceration around the plantar wounds may be drainage issues 6/13; in general the wound surfaces look quite good on both sides. Macerated skin and raised edges around the wound required debridement although in general especially on the left the surface area seems improved. oo The area on the right dorsal ankle is about the same I thought this would not be such a problem to close 6/20; not much change in either wound although the one on the right looks a little better. Both wounds have thick macerated edges to the skin requiring debridements. We have been using silver alginate. The area on the dorsal right  ankle is still open I thought this would be closed. 6/28; patient comes in today with a marked deterioration in the right foot wound fifth met head. Wide area of exposed bone this is a drastic change from last time. The area on the left there we have been casting is stagnant. We have been using  silver alginate in both wound areas. 7/5; bone culture I did for PCR last time was positive for Pseudomonas, group B strep, Enterococcus and Staph aureus. There was no suggestion of methicillin resistance or ampicillin resistant genes. This was resistant to tetracycline however He comes into the clinic today with the area over his right plantar fifth metatarsal head which had been doing so well 2 weeks ago completely necrotic feeling bone. I do not know that this is going to be salvageable. The left foot wound is certainly no smaller but it has a better surface and is superficial. 7/8; patient called in this morning to say that his total contact cast was rubbing against his foot. He states he is doing fine overall. He denies signs of infection. 7/12; continued deterioration in the wound over the right fifth metatarsal head crumbling bone. This is not going to be salvageable. The patient agrees and wants to be referred to Dr. Doran Durand which we will attempt to arrange as soon as possible. I am going to continue him on antibiotics as long as that takes so I will renew those today. The area on the left foot which is the base of the fifth metatarsal continues to look somewhat better. Healthy looking tissue no depth no debridement is necessary here. 7/20; the patient was kindly seen by Dr. Doran Durand of orthopedics on 10/19/2020. 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 Towards this end we have taken him out of the total contact cast on the left we will put him in a wrap with Hydrofera Blue. As I understand things surgery is planned for 7/21 7/27; patient had his surgery last Thursday. 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 Apparently everything went well we did not still disturb that today The area on the left foot actually looks quite good. He has been much less mobile which probably explains this he did not seem  to do well in the total contact cast secondary to drainage and maceration I think. 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 Fortunately there is no evidence of active infection at this time. No fevers, chills, nausea, vomiting, or diarrhea. Overall I think that he is actually doing extremely well. Nonetheless I do believe that he is staying off of this more following the surgery in his right foot that is the reason the left is doing so great. 8/16; left plantar foot wound. This looks smaller than the last time I saw this he is using Hydrofera Blue. The surgical wound on the right foot is being followed by Dr. Doran Durand we did not look at this today. He has surgical shoes on both feet 8/23; left plantar foot wound not as good this week. Surrounding macerated skin and subcutaneous tissue everything looks moist and wet. I do not think he is offloading this adequately. He is using a surgical shoe Apparently the right foot surgical wound is not open although I did not check his  foot 8/31; left plantar foot lateral aspect. Much improved this week. He has no maceration. Some improvement in the surface area of the wound but most impressively the depth is come in we are using silver alginate. The patient is a Product/process development scientist. He is asked that we write him a letter so he can go back to work. I have also tried to see if we can write something that will allow him to limit the amount of time that he is on his foot at work. 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 Hopefully the latter can be avoided 9/6; I believe the patient missed an appointment last week. He arrives in today with a wound looking roughly the same certainly no better. Undermining laterally and also inferiorly. We used molecuLight today in training  with the patient's permission.. We are using silver alginate 9/21 wound is measuring bigger this week although this may have to do with the aggressive circumferential debridement last week in response to the blush fluorescence on the MolecuLight. Culture I did last week showed significant MSSA and E. coli. I put him on Augmentin but he has not started it yet. We are also going to send this for compounded antibiotics at [facility]. There is no evidence of systemic infection 9/29; silver alginate. His Keystone arrived. He is completing Augmentin in 2 days. Offloading in a cam boot. Moderate drainage per our intake staff 10/5; using silver alginate. He has been using his [place]. He has completed his Augmentin. Per our intake nurse still a lot of drainage, far too much to consider a total contact cast. Wound measures about the same. He had the same undermining area that I defined last week from a roughly 11-3. I remove this today 10/12; using silver alginate he is using the [place]. He comes in for a nurse visit hence we are applying [employer] twice a week. Measuring slightly better today and less notable drainage. Extensive debridement of the wound edge last time 10/18; using topical Keystone and silver alginate and a soft cast. Wound measurements about the same. Drainage was through his soft cast. We are changing this twice a week Tuesdays and Friday 10/25; comes in with moderate drainage. Still using Keystone silver alginate and a soft cast. Wound dimensions completely the same.He has a lot of edema in the left leg he has lymphedema. Asking for [name] to consider wrapping him as he cannot get his stocking on over the soft cast 11/2; comes in with moderate to large drainage slightly smaller in terms of width we have been using [place]. His wound looks satisfactory but not much improvement 11/4; patient presents today for obligatory cast change. Has no issues or complaints today. He denies signs of  infection. 11/9; patient traveled this weekend to [place], was on the cast quite a bit. Staining of the cast with black material from his walking boot. Drainage was not quite as bad as we feared. Using silver alginate and Keystone 11/16; we do not have size for cast therefore we have been putting a soft cast on him since the change on Friday. Still a significant amount of drainage necessitating changing twice a week. We have been using the Keystone at cast changes either hard or soft as well as silver alginate Comes in the clinic with things actually looking fairly good improvement in width. He says his offloading is about the same 02/24/2021 upon evaluation today  patient actually comes back in and is doing excellent in regard to his foot ulcer this is significantly smaller even compared to the last visit. The soft cast seems to have done extremely well for him which is great news. I do not see any signs of infection minimal debridement will be needed today. 11/30; left lateral foot much improved half a centimeter improvement in surface area. No evidence of infection. He seems to be doing better with the soft cast in the TCC therefore we will continue with this. He comes back in later in the week for a change with the nurses. This is due to drainage 12/6; no improvement in dimensions. Under illumination some debris on the surface we have been using silver alginate, soft cast. If there is anything optimistic here he seems to have have less drainage 12/13. Dimensions are improved both length and width and slightly in depth. Appears to be quite healthy today. Raised edges of this thick skin and callus around the edges however. He is in a soft cast were bringing him back once for a change on Friday. Drainage is better Objective Constitutional Patient is hypertensive.. Pulse regular and within target range for patient.Marland Kitchen Respirations regular, non-labored and within target range.. Temperature is normal  and within the target range for the patient.Marland Kitchen Appears in no distress. Vitals Time Taken: 8:02 AM, Height: 77 in, Weight: 280 lbs, BMI: 33.2, Temperature: 97.9 F, Pulse: 84 bpm, Respiratory Rate: 18 breaths/min, Blood Pressure: 154/87 mmHg, Capillary Blood Glucose: 100 mg/dl. General Notes: Wound exam; the wound is smaller. Surface of the wound looks healthy. Once again he has callus and thick skin around the edges of most of this wound I used a #5 curette to try and bring this down to the level of the wound. There is no evidence of surrounding infection Integumentary (Hair, Skin) Wound #3 status is Open. Original cause of wound was Trauma. The date acquired was: 10/02/2019. The wound has been in treatment 72 weeks. The wound is located on the Left,Lateral Foot. The wound measures 1.9cm length x 1.5cm width x 0.3cm depth; 2.238cm^2 area and 0.672cm^3 volume. There is Fat Layer (Subcutaneous Tissue) exposed. There is no tunneling noted, however, there is undermining starting at 12:00 and ending at 3:00 with a maximum distance of 0.3cm. There is a medium amount of serosanguineous drainage noted. The wound margin is distinct with the outline attached to the wound base. There is large (67-100%) red, pink granulation within the wound bed. There is no necrotic tissue within the wound bed. 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 There was a Excisional Skin/Subcutaneous Tissue Debridement with a total area of 0.5 sq cm performed by [name], MD. With the following instrument(s): Curette to remove Non-Viable tissue/material. Material removed  includes Callus, Subcutaneous Tissue, and Skin: Dermis after achieving pain control using Other (Benzocaine). No specimens were taken. A time out was conducted at 08:22, prior to the start of the procedure. A Moderate amount of bleeding was controlled with Pressure. The procedure was tolerated well. Post Debridement Measurements: 1.9cm length x 1.5cm width x 0.3cm depth; 0.672cm^3 volume. Character of Wound/Ulcer Post Debridement is stable. Severity of Tissue Post Debridement is: Fat layer  exposed. Post procedure Diagnosis Wound #3: Same as Pre-Procedure Pre-procedure diagnosis of Wound #3 is a Diabetic Wound/Ulcer of the Lower Extremity located on the Left,Lateral Foot . An Soft Cast procedure was performed by [name], RN. Post procedure Diagnosis Wound #3: Same as Pre-Procedure Plan Follow-up Appointments: Return Appointment in 1 week. - Dr. Dellia Nims Nurse Visit: - Friday 03/19/21 to change soft cast Bathing/ Shower/ Hygiene: May shower with protection but do not get wound dressing(s) wet. Edema Control - Lymphedema / SCD / Other: Elevate legs to the level of the heart or above for 30 minutes daily and/or when sitting, a frequency of: - throughout the day Avoid standing for long periods of time. Exercise regularly Moisturize legs daily. - right leg every night before bed. Compression stocking or Garment 20-30 mm/Hg pressure to: - Apply to right leg in the morning and remove at night. Off-Loading: Other: - minimal weight bearing left foot Other: - Soft cast to left foot - wrap leg with kerlix/coban to reduce swelling Additional Orders / Instructions: Follow Nutritious Diet WOUND #3: - Foot Wound Laterality: Left, Lateral Cleanser: Soap and Water 2 x Per Week/30 Days Discharge Instructions: May shower and wash wound with dial antibacterial soap and water prior to dressing change. Cleanser: Wound Cleanser (Generic) 2 x Per Week/30 Days Discharge Instructions: Cleanse the wound with  wound cleanser prior to applying a clean dressing using gauze sponges, not tissue or cotton balls. Peri-Wound Care: Zinc Oxide Ointment 30g tube 2 x Per Week/30 Days Discharge Instructions: Apply Zinc Oxide to periwound with each dressing change as needed. Topical: keystone antibiotic compound 2 x Per Week/30 Days Discharge Instructions: thin layer to wound bed Prim Dressing: KerraCel Ag Gelling Fiber Dressing, 4x5 in (silver alginate) (Generic) 2 x Per Week/30 Days ary Discharge Instructions: Apply silver alginate to wound bed as instructed Secondary Dressing: ABD Pad, 8x10 2 x Per Week/30 Days Discharge Instructions: Apply over primary dressing as directed. Secondary Dressing: Zetuvit Plus 4x8 in (Generic) 2 x Per Week/30 Days Discharge Instructions: Apply over primary dressing as directed. Com pression Wrap: Kerlix Roll 4.5x3.1 (in/yd) 2 x Per Week/30 Days Discharge Instructions: Apply Kerlix and Coban compression as directed. Com pression Wrap: Coban Self-Adherent Wrap 4x5 (in/yd) 2 x Per Week/30 Days Discharge Instructions: Apply over Kerlix as directed. #1 I am continuing with the silver alginate. Still under the soft cast 2. I might be able to change this to a weekly thing is the drainage does not seem so bad. 3. Also still considering a total contact cast depending on improvement in dimensions Electronic Signature(s) Signed: 03/16/2021 4:30:32 PM By: [name] MD Entered By: [name] on 03/16/2021 08:49:11 -------------------------------------------------------------------------------- SuperBill Details Patient Name: Date of Service: [name], [name] 03/16/2021 Medical Record Number: [id number] Patient Account Number: [id number] Date of Birth/Sex: Treating RN: 1986/02/13 (35 y.o. [name] Primary Care Provider: [name] Other Clinician: Referring Provider: Treating Provider/Extender: [name] in Treatment: 72 Diagnosis  Coding ICD-10 Codes Code Description E11.621 Type 2 diabetes mellitus with foot ulcer L97.528 Non-pressure chronic ulcer of other part of left foot with other specified severity Facility Procedures CPT4 Code: [id number] Description: [phone] - DEBRIDE WOUND 1ST 20 SQ CM OR < ICD-10 Diagnosis Description L97.528 Non-pressure chronic ulcer of other part of left foot with other specified severi Modifier: ty Quantity: 1 Physician Procedures : CPT4 Code Description Modifier [id number] - WC PHYS DEBR WO ANESTH 20 SQ CM ICD-10 Diagnosis Description L97.528 Non-pressure chronic  ulcer of other part of left foot with other specified severity Quantity: 1 Electronic Signature(s) Signed: 03/16/2021 4:30:32 PM By: [name] MD Entered By: [name] on 03/16/2021 08:49:20

## 2021-03-19 ENCOUNTER — Other Ambulatory Visit: Payer: Self-pay

## 2021-03-19 ENCOUNTER — Encounter (HOSPITAL_BASED_OUTPATIENT_CLINIC_OR_DEPARTMENT_OTHER): Payer: BC Managed Care – PPO | Admitting: Internal Medicine

## 2021-03-19 DIAGNOSIS — E11621 Type 2 diabetes mellitus with foot ulcer: Secondary | ICD-10-CM | POA: Diagnosis not present

## 2021-03-22 NOTE — Progress Notes (Signed)
[place], [name] ([id number]) Visit Report for 03/19/2021 Soft Cast Details Patient Name: Date of Service: [name] D 03/19/2021 7:30 A M Medical Record Number: [id number] Patient Account Number: [id number] Date of Birth/Sex: Treating RN: [birth year] (35 y.o. [name] Primary Care Provider: [name] Other Clinician: Referring Provider: Treating Provider/Extender: Dione Plover in Treatment: 15 Procedure Performed for: Wound #3 Left,Lateral Foot Performed By: Clinician Antonieta Iba, RN Electronic Signature(s) Signed: 03/19/2021 8:44:23 AM By: Geralyn Corwin DO Signed: 03/22/2021 3:53:37 PM By: Antonieta Iba Entered By: Antonieta Iba on 03/19/2021 07:57:07 -------------------------------------------------------------------------------- SuperBill Details Patient Name: Date of Service: [name], [name] 03/19/2021 Medical Record Number: [id number] Patient Account Number: [id number] Date of Birth/Sex: Treating RN: October 29, 1985 (35 y.o. [name] Primary Care Provider: [name] Other Clinician: Referring Provider: Treating Provider/Extender: Dione Plover in Treatment: 46 Diagnosis Coding ICD-10 Codes Code Description E11.621 Type 2 diabetes mellitus with foot ulcer L97.528 Non-pressure chronic ulcer of other part of left foot with other specified severity Facility Procedures CPT4 Code: [id number] Description: [phone] Application of clubfoot cast with molding or manipulation, long or short leg ICD-10 Diagnosis Description L97.528 Non-pressure chronic ulcer of other part of left foot with other specified severity Modifier: Quantity: 1 Electronic Signature(s) Signed: 03/19/2021 8:44:23 AM By: Geralyn Corwin DO Signed: 03/22/2021 3:53:37 PM By: Antonieta Iba Entered By: Antonieta Iba on 03/19/2021 07:57:22

## 2021-03-23 ENCOUNTER — Other Ambulatory Visit: Payer: Self-pay

## 2021-03-23 ENCOUNTER — Encounter (HOSPITAL_BASED_OUTPATIENT_CLINIC_OR_DEPARTMENT_OTHER): Payer: BC Managed Care – PPO | Admitting: Internal Medicine

## 2021-03-23 DIAGNOSIS — E11621 Type 2 diabetes mellitus with foot ulcer: Secondary | ICD-10-CM | POA: Diagnosis not present

## 2021-03-23 NOTE — Progress Notes (Addendum)
[name], Max ([id number]) Visit Report for 03/23/2021 HPI Details Patient Name: Date of Service: [name] 03/23/2021 12:30 PM Medical Record Number: [id number] Patient Account Number: [id number] Date of Birth/Sex: Treating RN: 1986/01/30 (35 y.o. 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 He comes in for review of a wound over the plantar fifth metatarsal head extending into the lateral part of the foot. He was followed for this previously by his podiatrist Dr. Cornelius [name]. As the patient tells his story he went to see podiatry first for a swelling he developed on the lateral part of his fifth metatarsal head in May. He states this was "open" by podiatry and the area closed. He was followed up in June and it was again opened callus removed and it closed promptly. There were plans being made for surgery on the fifth metatarsal head in June however his blood sugar was apparently too high for anesthesia. Apparently the area was debrided and opened again in June and it is never closed since. Looking over the records from podiatry I am really not able to follow this. It was clear when he was first seen it was before 5/14 at that point he already had a wound. By 5/17 the ulcer was resolved. I do not see anything about a procedure. On 5/28 noted to have pre-ulcerative moderate keratosis. X-ray noted 1/5 contracted toe and tailor's bunion and metatarsal deformity. On a visit date on 09/28/2018 the dorsal part of the left foot it healed and resolved. There was concern about swelling in his lower extremity he was sent to the ER.. As far as I can tell he was seen in the ER on 7/12 with an ulcer on his left foot. A DVT rule  out of the left leg was negative. I do not think I have complete records from podiatry but I am not able to verify the procedures this patient states he had. He states after the last procedure the wound has never closed although I am not able to follow this in the records I have from podiatry. He has not had a recent x-ray The patient has been using Neosporin on the wound. He is wearing a Darco shoe. He is still very active up on his foot working and exercising. Past medical history; type 2 diabetes ketosis-prone, leg swelling with a negative DVT study in July. Non-smoker ABI in our clinic was 0.85 on the left 10/16; substantial wound on the plantar left fifth met head extending laterally almost to the dorsal fifth MTP. We have been using silver alginate we gave him a Darco forefoot off loader. An x-ray did not show evidence of osteomyelitis did note soft tissue emphysema which I think was due to gas tracking through an open wound. There is no doubt in my mind he requires an MRI 10/23; MRI not booked until 3 November at the earliest this is largely due to his glucose sensor in the right arm. We have been using silver alginate. There has been an improvement 10/29; I am still not exactly sure when his MRI is booked for. He says it is the third but it is the 10th in epic. This definitely needs to be done. He is running a low-grade fever today but no other symptoms. No real improvement  in the 1 02/26/2019 patient presents today for a follow-up visit here in our clinic he is last been seen in the clinic on October 29. Subsequently we were working on getting MRI to evaluate and see what exactly was going on and where we would need to go from the standpoint of whether or not he had osteomyelitis and again what treatments were going be required. Subsequently the patient ended up being admitted to the hospital on 02/07/2019 and was discharged on 02/14/2019. This is a somewhat interesting admission with a  discharge diagnosis of pneumonia due to COVID-19 although he was positive for COVID-19 when tested at the urgent care but negative x2 when he was actually in the hospital. With that being said he did have acute respiratory failure with hypoxia and it was noted he also have a left foot ulceration with osteomyelitis. With that being said he did require oxygen for his pneumonia and I level 4 L. He was placed on antivirals and steroids for the COVID-19. He was also transferred to the [facility] at one point. Nonetheless he did subsequently discharged home and since being home has done much better in that regard. The CT angiogram did not show any pulmonary embolism. With regard to the osteomyelitis the patient was placed on vancomycin and Zosyn while in the hospital but has been changed to Augmentin at discharge. It was also recommended that he follow- up with wound care and podiatry. Podiatry however wanted him to see [name] according to the patient prior to them doing anything further. His hemoglobin A1c was 9.9 as noted in the hospital. Have an MRI of the left foot performed while in the hospital on 02/04/2019. 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 There is an overlying plantar open wound noted an abscess tracking back along the lateral aspect of the fifth metatarsal shaft. There is otherwise diffuse cellulitis and mild fasciitis without findings of polymyositis. The patient did have recently pneumonia secondary to COVID-19 I looked in the chart through epic and it does appear that the patient may need to have an additional x-ray just to ensure everything is cleared and that he has no airspace disease prior to putting him into the [name]. 03/05/2019; patient was readmitted to the clinic last week. He was hospitalized twice for a viral upper respiratory tract infection from 11/1 through 11/4 and  then 11/5 through 11/12 ultimately this turned out to be Covid pneumonitis. Although he was discharged on oxygen he is not using it. He says he feels fine. He has no exercise limitation no cough no sputum. His O2 sat in our clinic today was 100% on room air. 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 He received Vanco and Zosyn in the hospital and then was discharged on 2 weeks of Augmentin. I do not see any relevant cultures. He was supposed to follow-up with infectious disease but I do not see that he has an appointment. 12/8; patient saw Dr. Novella Olive of infectious disease last week. He felt that he had had adequate antibiotic therapy. He did not go to follow-up with Dr. Amalia Hailey of podiatry and I have again talked to him about the pros and cons of this. He does not want to consider a ray amputation of this time. He is aware of the risks of  recurrence, migration etc. He started HBO today and tolerated this well. He can complete the Augmentin that I gave him last week. I have looked over the lab work that Dr. Chana Bode ordered his C-reactive protein was 3.3 and his sedimentation rate was 17. The C-reactive protein is never really been measurably that high in this patient 12/15; not much change in the wound today however he has undermining along the lateral part of the foot again more extensively than last week. He has some rims of epithelialization. We have been using silver alginate. He is undergoing hyperbarics but did not dive today 12/18; in for his obligatory first total contact cast change. Unfortunately there was pus coming from the undermining area around his fifth metatarsal head. This was cultured but will preclude reapplication of a cast. He is seen in conjunction with HBO 12/24; patient had staph lugdunensis in the wound in the undermining area laterally last time. We put him on  doxycycline which should have covered this. The wound looks better today. I am going to give him another week of doxycycline before reattempting the total contact cast 12/31; the patient is completing antibiotics. Hemorrhagic debris in the distal part of the wound with some undermining distally. He also had hyper granulation. Extensive debridement with a #5 curette. The infected area that was on the lateral part of the fifth met head is closed over. I do not think he needs any more antibiotics. Patient was seen prior to HBO. Preparations for a total contact cast were made in the cast will be placed post hyperbarics 04/11/19; once again the patient arrives today without complaint. He had been in a cast all week noted that he had heavy drainage this week. This resulted in large raised areas of macerated tissue around the wound 1/14; wound bed looks better slightly smaller. Hydrofera Blue has been changing himself. He had a heavy drainage last week which caused a lot of maceration around the wound so I took him out of a total contact cast he says the drainage is actually better this week He is seen today in conjunction with HBO 1/21; returns to clinic. He was up in [place] for a day or 2 attending a funeral. He comes back in with the wound larger and with a large area of exposed bone. He had osteomyelitis and septic arthritis of the fifth left metatarsal head while he was in hospital. He received IV antibiotics in the hospital for a prolonged period of time then 3 weeks of Augmentin. Subsequently I gave him 2 weeks of doxycycline for more superficial wound infection. When I saw this last week the wound was smaller the surface of the wound looks satisfactory. 1/28; patient missed hyperbarics today. Bone biopsy I did last time showed Enterococcus faecalis and Staphylococcus lugdunensis . He has a wide area of exposed bone. We are going to use silver alginate as of today. I had another ethical discussion  with the patient. 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 In this situation I think the likelihood of healing this is low. Therefore I have recommended a ray amputation and with the patient's agreement I have referred him to Dr. Doran Durand. The other issue is that his compliance with hyperbarics has been minimal because of his work schedule and given his underlying decision I am going to stop this today READMISSION 10/24/2019 MRI 09/29/2019 left foot IMPRESSION: 1. Apparent skin ulceration inferior and lateral to the 5th metatarsal base with underlying heterogeneous T2 signal  and enhancement in the subcutaneous fat. Small peripherally enhancing fluid collections along the plantar and lateral aspects of the 5th metatarsal base suspicious for abscesses. 2. Interval amputation through the mid 5th metatarsal with nonspecific low-level marrow edema and enhancement. Given the proximity to the adjacent soft tissue inflammatory changes, osteomyelitis cannot be excluded. 3. The additional bones appear unremarkable. MRI 09/29/2019 right foot IMPRESSION: 1. Soft tissue ulceration lateral to the 5th MTP joint. There is low-level T2 hyperintensity within the 4th and 5th metatarsal heads and adjacent proximal phalanges without abnormal T1 signal or cortical destruction. These findings are nonspecific and could be seen with early marrow edema, hyperemia or early osteomyelitis. No evidence of septic joint. 2. Mild tenosynovitis and synovial enhancement associated with the extensor digitorum tendons at the level of the midfoot. 3. Diffuse low-level muscular T2 hyperintensity and enhancement, most consistent with diabetic myopathy. LEFT FOOT BONE Methicillin resistant staphylococcus aureus Staphylococcus lugdunensis MIC MIC CIPROFLOXACIN >=8 RESISTANT Resistant <=0.5 SENSI... Sensitive CLINDAMYCIN <=0.25 SENS... Sensitive >=8 RESISTANT Resistant ERYTHROMYCIN >=8  RESISTANT Resistant >=8 RESISTANT Resistant GENTAMICIN <=0.5 SENSI... Sensitive <=0.5 SENSI... Sensitive Inducible Clindamycin NEGATIVE Sensitive NEGATIVE Sensitive OXACILLIN >=4 RESISTANT Resistant 2 SENSITIVE Sensitive RIFAMPIN <=0.5 SENSI... Sensitive <=0.5 SENSI... Sensitive TETRACYCLINE <=1 SENSITIVE Sensitive <=1 SENSITIVE Sensitive TRIMETH/SULFA <=10 SENSIT Sensitive <=10 SENSIT Sensitive ... Marland Kitchen.. VANCOMYCIN 1 SENSITIVE Sensitive <=0.5 SENSI... Sensitive Right foot bone . Component 3 wk ago Specimen Description BONE Special Requests RIGHT 4 METATARSAL SAMPLE B Gram Stain NO WBC SEEN NO ORGANISMS SEEN Culture RARE METHICILLIN RESISTANT STAPHYLOCOCCUS AUREUS NO ANAEROBES ISOLATED Performed at [facility] [address] [place], [place] [address] Report Status 10/08/2019 FINAL Organism ID, Bacteria METHICILLIN RESISTANT STAPHYLOCOCCUS AUREUS Resulting Agency [facility] Susceptibility Methicillin resistant staphylococcus aureus MIC CIPROFLOXACIN >=8 RESISTANT Resistant CLINDAMYCIN <=0.25 SENS... Sensitive ERYTHROMYCIN >=8 RESISTANT Resistant GENTAMICIN <=0.5 SENSI... Sensitive Inducible Clindamycin NEGATIVE Sensitive OXACILLIN >=4 RESISTANT Resistant RIFAMPIN <=0.5 SENSI... Sensitive TETRACYCLINE <=1 SENSITIVE Sensitive TRIMETH/SULFA <=10 SENSIT Sensitive ... VANCOMYCIN 1 SENSITIVE Sensitive This is a patient we had in clinic earlier this year with a wound over his left fifth metatarsal head. 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 Doran Durand on 05/16/2019. He seems to have developed problems with wounds on his bilateral feet in June 2021 although he may have had problems earlier than  this. He was in an urgent care with a right foot ulcer on 09/26/2019 and given a course of doxycycline. This was apparently after having trouble getting into see orthopedics. He was seen by podiatry on 09/28/2019 noted to have bilateral lower extremity ulcers including the left lateral fifth metatarsal base and the right subfifth met head. It was noted that had purulent drainage at that time. He required hospitalization from 6/20 through 7/2. This was because of worsening right foot wounds. He underwent bilateral operative incision and drainage and bone biopsies bilaterally. Culture results are listed above. He has been referred back to clinic by Dr. Jacqualyn Posey of podiatry. He is also followed by Dr. Megan Salon who saw him yesterday. He was discharged from hospital on Zyvox Flagyl and Levaquin and yesterday changed to doxycycline Flagyl and Levaquin. His inflammatory markers on 6/26 showed a sedimentation rate of 129 and a C-reactive protein of 5. This is  improved to 14 and 1.3 respectively. This would indicate improvement. ABIs in our clinic today were 1.23 on the right and 1.20 on the left 11/01/2019 on evaluation today patient appears to be doing fairly well in regard to the wounds on his feet at this point. Fortunately there is no signs of active infection at this time. No fevers, chills, nausea, vomiting, or diarrhea. He currently is seeing infectious disease and still under their care at this point. Subsequently he also has both wounds which she has not been using collagen on as he did not receive that in his packaging he did not call us and let us know that. Apparently that just was missed on the order. Nonetheless we will get that straightened out today. 8/9-Patient returns for bilateral foot wounds, using Prisma with hydrogel moistened dressings, and the wounds appear stable. Patient using surgical shoes, avoiding much pressure or weightbearing as much as possible 8/16; patient has bilateral foot  wounds. 1 on the right lateral foot proximally the other is on the left mid lateral foot. Both required debridement of callus and thick skin around the wounds. We have been using silver collagen 8/27; patient has bilateral lateral foot wounds. The area on the left substantially surrounded by callus and dry skin. This was removed from the wound edge. The underlying wound is small. The area on the right measured somewhat smaller today. 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 Unfortunately I did not update his antibiotics during today's visit. 9/10 I reviewed Dr. Hale Bogus last notes he felt he had completed antibiotics his inflammatory markers were reasonably well controlled. He has a small wound on the lateral left foot and a tiny area on the right which is just above closed. He is using Hydrofera Blue with border foam he has bilateral surgical shoes 9/24; 2 week f/u. doing well. right foot is closed. left foot still undermined. 10/14; right foot remains closed at the fifth met head. The area over the base of the left fifth metatarsal has a small open area but considerable undermining towards the plantar foot. Thick callus skin around this suggests an adequate pressure relief. We have talked about this. He says he is going to go back into his cam boot. I suggested a total contact cast he did not seem enamored with this suggestion 10/26; left foot base of the fifth metatarsal. Same condition as last time. He has skin over the area with an open wound however the skin is not adherent. He went to see Dr. Earleen Newport who did an x-ray and culture of his foot I have not reviewed the x-ray but the patient was not told anything. He is on doxycycline 11/11; since the patient was last here he was in the emergency room on 10/30 he was concerned about swelling in the left foot. They did not do any cultures or x-rays. They changed his antibiotics to  cephalexin. Previous culture showed group B strep. The cephalexin is appropriate as doxycycline has less than predictable coverage. Arrives in clinic today with swelling over this area under the wound. He also has a new wound on the right fifth metatarsal head 11/18; the patient has a difficult wound on the lateral aspect of the left fifth metatarsal head. The wound was almost ballotable last week I opened it slightly expecting to see purulence however there was just bleeding. I cultured this this was negative. X-ray unchanged. We are trying to get an MRI  but I am not sure were going to be able to get this through his insurance. He also has an area on the right lateral fifth metatarsal head this looks healthier 12/3; the patient finally got our MRI. Surprisingly this did not show osteomyelitis. I did show the soft tissue ulceration at the lateral plantar aspect of the fifth metatarsal base with a tiny residual 6 mm abscess overlying the superficial fascia I have tried to culture this area I have not been able to get this to grow anything. Nevertheless the protruding tissue looks aggravated. I suspect we should try to treat the underlying "abscess with broad-spectrum antibiotics. I am going to start him on Levaquin and Flagyl. He has much less edema in his legs and I am going to continue to wrap his legs and see him weekly 12/10. I started Levaquin and Flagyl on him last week. He just picked up the Flagyl apparently there was some delay. The worry is the wound on the left fifth metatarsal base which is substantial and worsening. His foot looks like he inverts at the ankle making this a weightbearing surface. Certainly no improvement in fact I think the measurements of this are somewhat worse. We have been using 12/17; he apparently just got the Levaquin yesterday this is 2 weeks after the fact. He has completed the Flagyl. The area over the left fifth metatarsal base still has protruding granulation  tissue although it does not look quite as bad as it did some weeks ago. He has severe bilateral lymphedema although we have not been treating him for wounds on his legs this is definitely going to require compression. There was so much edema in the left I did not wish to put him in a total contact cast today. I am going to increase his compression from 3-4 layer. The area on the right lateral fifth met head actually look quite good and superficial. 12/23; patient arrived with callus on the right fifth met head and the substantial hyper granulated callused wound on the base of his fifth metatarsal. He says he is completing his Levaquin in 2 days but I do not think that adds up with what I gave him but I will have to double check this. We are using Hydrofera Blue on both areas. My plan is to put the left leg in a cast the week after New Year's 04/06/2020; patient's wounds about the same. Right lateral fifth metatarsal head and left lateral foot over the base of the fifth metatarsal. There is undermining on the left lateral foot which I removed before application of total contact cast continuing with Hydrofera Blue new. Patient tells me he was seen by endocrinology today lab work was done [Dr. Kerr]. Also wondering whether he was referred to cardiology. I went over some lab work from previously does not have chronic renal failure certainly not nephrotic range proteinuria he does have very poorly controlled diabetes but this is not his most updated lab work. Hemoglobin A1c has been over 11 1/10; the patient had a considerable amount of leakage towards mid part of his left foot with macerated skin however the wound surface looks better the area on the right lateral fifth met head is better as well. I am going to change the dressing on the left foot under the total contact cast to silver alginate, continue with Hydrofera Blue on the right. 1/20; patient was in the total contact cast for 10 days. Considerable  amount of drainage although the skin around  the wound does not look too bad on the left foot. The area on the right fifth metatarsal head is closed. Our nursing staff reports large amount of drainage out of the left lateral foot wound 1/25; continues with copious amounts of drainage described by our intake staff. PCR culture I did last week showed E. coli and Enterococcus faecalis and low quantities. Multiple resistance genes documented including extended spectrum beta lactamase, MRSA, MRSE, quinolone, tetracycline. The wound is not quite as good this week as it was 5 days ago but about the same size 2/3; continues with copious amounts of malodorous drainage per our intake nurse. The PCR culture I did 2 weeks ago showed E. coli and low quantities of Enterococcus. There were multiple resistance genes detected. I put Neosporin on him last week although this does not seem to have helped. The wound is slightly deeper today. Offloading continues to be an issue here although with the amount of drainage she has a total contact cast is just not going to work 2/10; moderate amount of drainage. Patient reports he cannot get his stocking on over the dressing. I told him we have to do that the nurse gave him suggestions on how to make this work. The wound is on the bottom and lateral part of his left foot. Is cultured predominantly grew low amounts of Enterococcus, E. coli and anaerobes. There were multiple resistance genes detected including extended spectrum beta lactamase, quinolone, tetracycline. I could not think of an easy oral combination to address this so for now I am going to do topical antibiotics provided by [facility] I think the main agents here are vancomycin and an aminoglycoside. We have to be able to give him access to the wounds to get the topical antibiotic on 2/17; moderate amount of drainage this is unchanged. He has his Keystone topical antibiotic against the deep tissue culture organisms. He  has been using this and changing the dressing daily. Silver alginate on the wound surface. 2/24; using Keystone antibiotic with silver alginate on the top. He had too much drainage for a total contact cast at one point although I think that is improving and I think in the next week or 2 it might be possible to replace a total contact cast I did not do this today. In general the wound surface looks healthy however he continues to have thick rims of skin and subcutaneous tissue around the wide area of the circumference which I debrided 06/04/2020 upon evaluation today patient appears to be doing well in regard to his wound. I do feel like he is showing signs of improvement. There is little bit of callus and dead tissue around the edges of the wound as well as what appears to be a little bit of a sinus tract that is off to the side laterally I would perform debridement to clear that away today. 3/17; left lateral foot. The wound looks about the same as I remember. Not much depth surface looks healthy. No evidence of infection 3/25; left lateral foot. Wound surface looks about the same. Separating epithelium from the circumference. There really is no evidence of infection here however not making progress by my view 3/29; left lateral foot. Surface of the wound again looks reasonably healthy still thick skin and subcutaneous tissue around the wound margins. There is no evidence of infection. One of the concerns being brought up by the nurses has again the amount of drainage vis--vis continued use of a total contact cast 4/5; left  lateral foot at roughly the base of the fifth metatarsal. Nice healthy looking granulated tissue with rims of epithelialization. The overall wound measurements are not any better but the tissue looks healthy. The only concern is the amount of drainage although he has no surrounding maceration with what we have been doing recently to absorb fluid and protect his skin. He also has  lymphedema. 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 He states that the kerlix/coban under the TCC rolled down and could not get it back up. He has been using an offloading scooter and has somehow hurt his right foot using this device. This happened last week. He states that the side of his right foot developed a blister and opened. The top of his foot also has a few small open wounds he thinks is due to his socks rubbing in his shoes. He has not been using any dressings to the wound. He denies purulent drainage, fever/chills or erythema to the wounds. 4/22; patient presents for 1 week follow-up. He developed new wounds to the right foot that were evaluated at last clinic visit. He continues to have a total contact cast to the left leg and he reports no issues. He has been using silver collagen to the right foot wounds with no issues. He denies purulent drainage, fever/chills or erythema to the right foot wounds. He has no complaints today 4/25; patient presents for 1 week follow-up. He has a total contact cast of the left leg and reports no issues. He has been using silver alginate to the right foot wound. He denies purulent drainage, fever/chills or erythema to the right foot wounds. 5/2 patient presents for 1 week follow-up. T contact cast on the left. The wound which is on the base of the plantar foot at the base of the fifth metatarsal otal actually looks quite good and dimensions continue to gradually contract. HOWEVER the area on the right lateral fifth metatarsal head is much larger than what I remember from 2 weeks ago. Once more is he has significant levels of hypergranulation. Noteworthy that he had this same hyper granulated response on his  wound on the left foot at one point in time. So much so that he I thought there was an underlying fluid collection. Based on this I think this just needs debridement. 5/9; the wound on the left actually continues to be gradually smaller with a healthy surface. Slight amount of drainage and maceration of the skin around but not too bad. However he has a large wound over the right fifth metatarsal head very much in the same configuration as his left foot wound was initially. I used silver nitrate to address the hyper granulated tissue no mechanical debridement 5/16; area on the left foot did not look as healthy this week deeper thick surrounding macerated skin and subcutaneous tissue. The area on the right foot fifth met head was about the same The area on the right ankle that we identified last week is completely broken down into an open wound presumably a stocking rubbing issue 5/23; patient has been using a total contact cast to the left side. He has been using silver alginate underneath. He has also been using silver alginate to the right foot  wounds. He has no complaints today. He denies any signs of infection. 5/31; the left-sided wound looks some better measure smaller surface granulation looks better. We have been using silver alginate under the total contact cast The large area on his right fifth met head and right dorsal foot look about the same still using silver alginate 6/6; neither side is good as I was hoping although the surface area dimensions are better. A lot of maceration on his left and right foot around the wound edge. Area on the dorsal right foot looks better. He says he was traveling. I am not sure what does the amount of maceration around the plantar wounds may be drainage issues 6/13; in general the wound surfaces look quite good on both sides. Macerated skin and raised edges around the wound required debridement although in general especially on the left the surface area  seems improved. The area on the right dorsal ankle is about the same I thought this would not be such a problem to close 6/20; not much change in either wound although the one on the right looks a little better. Both wounds have thick macerated edges to the skin requiring debridements. We have been using silver alginate. The area on the dorsal right ankle is still open I thought this would be closed. 6/28; patient comes in today with a marked deterioration in the right foot wound fifth met head. Wide area of exposed bone this is a drastic change from last time. The area on the left there we have been casting is stagnant. We have been using silver alginate in both wound areas. 7/5; bone culture I did for PCR last time was positive for Pseudomonas, group B strep, Enterococcus and Staph aureus. There was no suggestion of methicillin resistance or ampicillin resistant genes. This was resistant to tetracycline however He comes into the clinic today with the area over his right plantar fifth metatarsal head which had been doing so well 2 weeks ago completely necrotic feeling bone. I do not know that this is going to be salvageable. The left foot wound is certainly no smaller but it has a better surface and is superficial. 7/8; patient called in this morning to say that his total contact cast was rubbing against his foot. He states he is doing fine overall. He denies signs of infection. 7/12; continued deterioration in the wound over the right fifth metatarsal head crumbling bone. This is not going to be salvageable. The patient agrees and wants to be referred to Dr. Doran Durand which we will attempt to arrange as soon as possible. I am going to continue him on antibiotics as long as that takes so I will renew those today. The area on the left foot which is the base of the fifth metatarsal continues to look somewhat better. Healthy looking tissue no depth no debridement is necessary here. 7/20; the patient was  kindly seen by Dr. Doran Durand of orthopedics on 10/19/2020. 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 Towards this end we have taken him out of the total contact cast on the left we will put him in a wrap with Hydrofera Blue. As I understand things surgery is planned for 7/21 7/27; patient had his surgery last Thursday. 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 Apparently everything went well we did not still disturb that today The area on the left foot actually  looks quite good. He has been much less mobile which probably explains this he did not seem to do well in the total contact cast secondary to drainage and maceration I think. 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 Fortunately there is no evidence of active infection at this time. No fevers, chills, nausea, vomiting, or diarrhea. Overall I think that he is actually doing extremely well. Nonetheless I do believe that he is staying off of this more following the surgery in his right foot that is the reason the left is doing so great. 8/16; left plantar foot wound. This looks smaller than the last time I saw this he is using Hydrofera Blue. The surgical wound on the right foot is being followed by Dr. Doran Durand we did not look at this today. He has surgical shoes on both feet 8/23; left plantar foot wound not as good this week. Surrounding macerated skin and subcutaneous tissue everything looks moist and wet. I do not think he is offloading this adequately. He is using a surgical shoe Apparently the right foot surgical wound is not open although I did not check his foot 8/31; left plantar foot lateral aspect. Much improved this week. He has no maceration. Some improvement in the surface area of the wound but most impressively the depth is come in we  are using silver alginate. The patient is a Product/process development scientist. He is asked that we write him a letter so he can go back to work. I have also tried to see if we can write something that will allow him to limit the amount of time that he is on his foot at work. 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 Hopefully the latter can be avoided 9/6; I believe the patient missed an appointment last week. He arrives in today with a wound looking roughly the same certainly no better. Undermining laterally and also inferiorly. We used molecuLight today in training with the patient's permission.. We are using silver alginate 9/21 wound is measuring bigger this week although this may have to do with the aggressive circumferential debridement last week in response to the blush fluorescence on the MolecuLight. Culture I did last week showed significant MSSA and E. coli. I put him on Augmentin but he has not started it yet. We are also going to send this for compounded antibiotics at [facility]. There is no evidence of systemic infection 9/29; silver alginate. His Keystone arrived. He is completing Augmentin in 2 days. Offloading in a cam boot. Moderate drainage per our intake staff 10/5; using silver alginate. He has been using his Benson. He has completed his Augmentin. Per our intake nurse still a lot of drainage, far too much to consider a total contact cast. Wound measures about the same. He had the same undermining area that I defined last week from a roughly 11-3. I remove this today 10/12; using silver alginate he is using the Cassandra. He comes in for a nurse visit hence we are applying [employer] twice a week. Measuring slightly better today and less notable drainage. Extensive debridement of the wound edge last time 10/18; using topical Keystone and silver alginate and a soft cast. Wound measurements about the same. Drainage  was through his soft cast. We are changing this twice a week Tuesdays  and Friday 10/25; comes in with moderate drainage. Still using Keystone silver alginate and a soft cast. Wound dimensions completely the same.He has a lot of edema in the left leg he has lymphedema. Asking for [name] to consider wrapping him as he cannot get his stocking on over the soft cast 11/2; comes in with moderate to large drainage slightly smaller in terms of width we have been using [place]. His wound looks satisfactory but not much improvement 11/4; patient presents today for obligatory cast change. Has no issues or complaints today. He denies signs of infection. 11/9; patient traveled this weekend to [place], was on the cast quite a bit. Staining of the cast with black material from his walking boot. Drainage was not quite as bad as we feared. Using silver alginate and Keystone 11/16; we do not have size for cast therefore we have been putting a soft cast on him since the change on Friday. Still a significant amount of drainage necessitating changing twice a week. We have been using the Keystone at cast changes either hard or soft as well as silver alginate Comes in the clinic with things actually looking fairly good improvement in width. He says his offloading is about the same 02/24/2021 upon evaluation today patient actually comes back in and is doing excellent in regard to his foot ulcer this is significantly smaller even compared to the last visit. The soft cast seems to have done extremely well for him which is great news. I do not see any signs of infection minimal debridement will be needed today. 11/30; left lateral foot much improved half a centimeter improvement in surface area. No evidence of infection. He seems to be doing better with the soft cast in the TCC therefore we will continue with this. He comes back in later in the week for a change with the nurses. This is due to drainage 12/6; no improvement in  dimensions. Under illumination some debris on the surface we have been using silver alginate, soft cast. If there is anything optimistic here he seems to have have less drainage 12/13. Dimensions are improved both length and width and slightly in depth. Appears to be quite healthy today. Raised edges of this thick skin and callus around the edges however. He is in a soft cast were bringing him back once for a change on Friday. Drainage is better 12/20. Dimensions are improved. He still has raised edges of thick skin and callus around the edges. We are using a soft cast Electronic Signature(s) Signed: 03/23/2021 4:29:35 PM By: [name] MD Entered By: [name] on 03/23/2021 13:03:42 -------------------------------------------------------------------------------- Soft Cast Details Patient Name: Date of Service: [name], [name] 03/23/2021 12:30 PM Medical Record Number: [id number] Patient Account Number: [id number] Date of Birth/Sex: Treating RN: Apr 19, 1985 (35 y.o. [name] Primary Care Provider: Seward [name] Other Clinician: Referring Provider: Treating Provider/Extender: [name] in Treatment: 14 Procedure Performed for: Wound #3 Left,Lateral Foot Performed By: Physician [name], MD Post Procedure Diagnosis Same as Pre-procedure Electronic Signature(s) Signed: 03/23/2021 4:29:35 PM By: [name] MD Signed: 03/23/2021 5:35:33 PM By: [name] RN, BSN Entered By: [name] on 03/23/2021 13:27:11 -------------------------------------------------------------------------------- Physical Exam Details Patient Name: Date of Service: [name] 03/23/2021 12:30 PM Medical Record Number: [id number] Patient Account Number: [id number] Date of Birth/Sex: Treating RN: 1985/09/16 (35 y.o. [name] Primary Care Provider: Seward [name] Other Clinician: Referring Provider: Treating Provider/Extender:  [name]  in Treatment: 76 Constitutional Patient is hypertensive.. Pulse regular and within target range for patient.Marland Kitchen Respirations regular, non-labored and within target range.. Temperature is normal and within the target range for the patient.Marland Kitchen Appears in no distress. Cardiovascular Pedal pulses palpable on the left. Notes Wound exam; wound is measuring smaller. Surface looks healthy. I did not debride the thick edges around the wound today we will follow the measurements and determine need for further debridement. There is a scant amount of undermining at 1-3 o'clock Electronic Signature(s) Signed: 03/23/2021 4:29:35 PM By: [name] MD Entered By: [name] on 03/23/2021 13:04:40 -------------------------------------------------------------------------------- Physician Orders Details Patient Name: Date of Service: [name], [name] 03/23/2021 12:30 PM Medical Record Number: [id number] Patient Account Number: [id number] Date of Birth/Sex: Treating RN: 03/18/1986 (35 y.o. 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 - Dr. Dellia Nims Return A Nurse Visit: - Thursday 12/22 to change soft cast Bathing/ Shower/ Hygiene May shower with protection but do not get wound dressing(s) wet. Edema Control - Lymphedema / SCD / Other Bilateral Lower Extremities Elevate legs to the level of the heart or above for 30 minutes daily and/or when sitting, a frequency of: - throughout the day Avoid standing for long periods of time. Exercise regularly Moisturize legs daily. -  right leg every night before bed. Compression stocking or Garment 20-30 mm/Hg pressure to: - Apply to right leg in the morning and remove at night. Off-Loading Other: - minimal weight bearing left foot Other: - Soft cast to left foot - wrap leg with kerlix/coban to reduce swelling Additional Orders / Instructions Follow Nutritious Diet Wound Treatment Wound #3 - Foot Wound Laterality: Left, Lateral Cleanser: Soap and Water 2 x Per Week/30 Days Discharge Instructions: May shower and wash wound with dial antibacterial soap and water prior to dressing change. Cleanser: Wound Cleanser (Generic) 2 x Per Week/30 Days Discharge Instructions: Cleanse the wound with wound cleanser prior to applying a clean dressing using gauze sponges, not tissue or cotton balls. Peri-Wound Care: Zinc Oxide Ointment 30g tube 2 x Per Week/30 Days Discharge Instructions: Apply Zinc Oxide to periwound with each dressing change as needed. Topical: keystone antibiotic compound 2 x Per Week/30 Days Discharge Instructions: thin layer to wound bed Prim Dressing: KerraCel Ag Gelling Fiber Dressing, 4x5 in (silver alginate) (Generic) 2 x Per Week/30 Days ary Discharge Instructions: Apply silver alginate to wound bed as instructed Secondary Dressing: ABD Pad, 8x10 2 x Per Week/30 Days Discharge Instructions: Apply over primary dressing as directed. Secondary Dressing: Zetuvit Plus 4x8 in (Generic) 2 x Per Week/30 Days Discharge Instructions: Apply over primary dressing as directed. Compression Wrap: Kerlix Roll 4.5x3.1 (in/yd) 2 x Per Week/30 Days Discharge Instructions: Apply Kerlix and Coban compression as directed. Compression Wrap: Coban Self-Adherent Wrap 4x5 (in/yd) 2 x Per Week/30 Days Discharge Instructions: Apply over Kerlix as directed. Electronic Signature(s) Signed: 03/23/2021 4:29:35 PM By: [name] MD Signed: 03/23/2021 5:35:33 PM By: [name] RN, BSN Entered By: [name] on 03/23/2021  13:00:20 -------------------------------------------------------------------------------- Problem List Details Patient Name: Date of Service: [name], [name] 03/23/2021 12:30 PM Medical Record Number: [id number] Patient Account Number: [id number] Date of Birth/Sex: Treating RN:  1985-04-26 (35 y.o. [name] Primary Care Provider: Seward [name] Other Clinician: Referring Provider: Treating Provider/Extender: [name] in Treatment: 24 Active Problems ICD-10 Encounter Code Description Active Date MDM Diagnosis E11.621 Type 2 diabetes mellitus with foot ulcer 10/24/2019 No Yes L97.528 Non-pressure chronic ulcer of other part of left foot with other specified 10/24/2019 No Yes severity Inactive Problems ICD-10 Code Description Active Date Inactive Date L97.518 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ity 10/24/2019 10/24/2019 L97.518 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ity 07/14/2020 07/14/2020 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, right ankle and foot 10/24/2019 10/24/2019 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 08/10/2020 08/10/2020 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, left ankle and foot 10/24/2019 10/24/2019 B95.62 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 10/24/2019 10/24/2019 classified elsewhere Resolved Problems Electronic Signature(s) Signed: 03/23/2021 4:29:35 PM By: [name] MD Entered By: [name] on 03/23/2021 13:02:58 -------------------------------------------------------------------------------- Progress Note Details Patient Name: Date of Service: [name], [name] 03/23/2021 12:30 PM Medical Record Number: [id number] Patient Account Number: [id number] Date of Birth/Sex: Treating RN: March 26, 1986 (35 y.o. 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 He comes in for review of a wound over the plantar fifth metatarsal head extending into the lateral part of the foot. He was followed for this previously by his podiatrist Dr. Cornelius [name]. As the patient tells his story he went to see podiatry first for a swelling he developed on the lateral part of his fifth metatarsal head in May. He states this was "open" by podiatry and the area closed. He was followed up in June and it was again opened callus removed and it closed promptly. There were plans being made for surgery on the fifth metatarsal head in June however his blood sugar was apparently too high for anesthesia. Apparently the area was debrided and opened again in June and it is never closed since. Looking over the records from podiatry I am really not able to follow this. It was clear when he was first seen it was before 5/14 at that point he already had a wound. By 5/17 the ulcer was resolved. I do not see anything about a procedure. On 5/28 noted to have pre-ulcerative moderate keratosis. X-ray noted 1/5 contracted toe and tailor's bunion and metatarsal deformity. On a visit date on 09/28/2018 the dorsal part of the left foot it healed and resolved. There was concern about swelling in his lower extremity he was sent to the ER.. As far as I can tell he was seen in the ER on 7/12 with an ulcer on his left foot. A DVT rule out of the left leg was negative. I do not think I have complete records from podiatry but I am not able to verify the procedures this patient states he had. He states after the last procedure the wound has never closed although I am not able to follow this in the records I have from podiatry. He has not had  a recent x-ray The patient has been using Neosporin on the wound. He is wearing a Darco shoe. He is still very active up on his foot working and exercising. Past medical history; type 2  diabetes ketosis-prone, leg swelling with a negative DVT study in July. Non-smoker ABI in our clinic was 0.85 on the left 10/16; substantial wound on the plantar left fifth met head extending laterally almost to the dorsal fifth MTP. We have been using silver alginate we gave him a Darco forefoot off loader. An x-ray did not show evidence of osteomyelitis did note soft tissue emphysema which I think was due to gas tracking through an open wound. There is no doubt in my mind he requires an MRI 10/23; MRI not booked until 3 November at the earliest this is largely due to his glucose sensor in the right arm. We have been using silver alginate. There has been an improvement 10/29; I am still not exactly sure when his MRI is booked for. He says it is the third but it is the 10th in epic. This definitely needs to be done. He is running a low-grade fever today but no other symptoms. No real improvement in the 1 02/26/2019 patient presents today for a follow-up visit here in our clinic he is last been seen in the clinic on October 29. Subsequently we were working on getting MRI to evaluate and see what exactly was going on and where we would need to go from the standpoint of whether or not he had osteomyelitis and again what treatments were going be required. Subsequently the patient ended up being admitted to the hospital on 02/07/2019 and was discharged on 02/14/2019. This is a somewhat interesting admission with a discharge diagnosis of pneumonia due to COVID-19 although he was positive for COVID-19 when tested at the urgent care but negative x2 when he was actually in the hospital. With that being said he did have acute respiratory failure with hypoxia and it was noted he also have a left foot ulceration with  osteomyelitis. With that being said he did require oxygen for his pneumonia and I level 4 L. He was placed on antivirals and steroids for the COVID-19. He was also transferred to the [facility] at one point. Nonetheless he did subsequently discharged home and since being home has done much better in that regard. The CT angiogram did not show any pulmonary embolism. With regard to the osteomyelitis the patient was placed on vancomycin and Zosyn while in the hospital but has been changed to Augmentin at discharge. It was also recommended that he follow- up with wound care and podiatry. Podiatry however wanted him to see [name] according to the patient prior to them doing anything further. His hemoglobin A1c was 9.9 as noted in the hospital. Have an MRI of the left foot performed while in the hospital on 02/04/2019. 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 There is an overlying plantar open wound noted an abscess tracking back along the lateral aspect of the fifth metatarsal shaft. There is otherwise diffuse cellulitis and mild fasciitis without findings of polymyositis. The patient did have recently pneumonia secondary to COVID-19 I looked in the chart through epic and it does appear that the patient may need to have an additional x-ray just to ensure everything is cleared and that he has no airspace disease prior to putting him into the [name]. 03/05/2019; patient was readmitted to the clinic last week. He was hospitalized twice for a viral upper respiratory tract infection from 11/1 through 11/4 and then 11/5 through 11/12 ultimately this turned out to be Covid pneumonitis. Although  he was discharged on oxygen he is not using it. He says he feels fine. He has no exercise limitation no cough no sputum. His O2 sat in our clinic today was 100% on room air. He did manage to have his MRI which showed septic  arthritis at the fifth MTP joint and osteomyelitis involving the fifth metatarsal head and proximal phalanx. He received Vanco and Zosyn in the hospital and then was discharged on 2 weeks of Augmentin. I do not see any relevant cultures. He was supposed to follow-up with infectious disease but I do not see that he has an appointment. 12/8; patient saw Dr. Novella Olive of infectious disease last week. He felt that he had had adequate antibiotic therapy. He did not go to follow-up with Dr. Amalia Hailey of podiatry and I have again talked to him about the pros and cons of this. He does not want to consider a ray amputation of this time. He is aware of the risks of recurrence, migration etc. He started HBO today and tolerated this well. He can complete the Augmentin that I gave him last week. I have looked over the lab work that Dr. Chana Bode ordered his C-reactive protein was 3.3 and his sedimentation rate was 17. The C-reactive protein is never really been measurably that high in this patient 12/15; not much change in the wound today however he has undermining along the lateral part of the foot again more extensively than last week. He has some rims of epithelialization. We have been using silver alginate. He is undergoing hyperbarics but did not dive today 12/18; in for his obligatory first total contact cast change. Unfortunately there was pus coming from the undermining area around his fifth metatarsal head. This was cultured but will preclude reapplication of a cast. He is seen in conjunction with HBO 12/24; patient had staph lugdunensis in the wound in the undermining area laterally last time. We put him on doxycycline which should have covered this. The wound looks better today. I am going to give him another week of doxycycline before reattempting the total contact cast 12/31; the patient is completing antibiotics. Hemorrhagic debris in the distal part of the wound with some undermining distally. He also had  hyper granulation. Extensive debridement with a #5 curette. The infected area that was on the lateral part of the fifth met head is closed over. I do not think he needs any more antibiotics. Patient was seen prior to HBO. Preparations for a total contact cast were made in the cast will be placed post hyperbarics 04/11/19; once again the patient arrives today without complaint. He had been in a cast all week noted that he had heavy drainage this week. This resulted in large raised areas of macerated tissue around the wound 1/14; wound bed looks better slightly smaller. Hydrofera Blue has been changing himself. He had a heavy drainage last week which caused a lot of maceration around the wound so I took him out of a total contact cast he says the drainage is actually better this week He is seen today in conjunction with HBO 1/21; returns to clinic. He was up in [place] for a day or 2 attending a funeral. He comes back in with the wound larger and with a large area of exposed bone. He had osteomyelitis and septic arthritis of the fifth left metatarsal head while he was in hospital. He received IV antibiotics in the hospital for a prolonged period of time then 3 weeks of Augmentin. Subsequently I  gave him 2 weeks of doxycycline for more superficial wound infection. When I saw this last week the wound was smaller the surface of the wound looks satisfactory. 1/28; patient missed hyperbarics today. Bone biopsy I did last time showed Enterococcus faecalis and Staphylococcus lugdunensis . He has a wide area of exposed bone. We are going to use silver alginate as of today. I had another ethical discussion with the patient. 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 In this situation I think the likelihood of healing this is low. Therefore I have recommended a ray amputation and with the patient's agreement I have referred him to Dr. Doran Durand. The other issue is that his  compliance with hyperbarics has been minimal because of his work schedule and given his underlying decision I am going to stop this today READMISSION 10/24/2019 MRI 09/29/2019 left foot IMPRESSION: 1. Apparent skin ulceration inferior and lateral to the 5th metatarsal base with underlying heterogeneous T2 signal and enhancement in the subcutaneous fat. Small peripherally enhancing fluid collections along the plantar and lateral aspects of the 5th metatarsal base suspicious for abscesses. 2. Interval amputation through the mid 5th metatarsal with nonspecific low-level marrow edema and enhancement. Given the proximity to the adjacent soft tissue inflammatory changes, osteomyelitis cannot be excluded. 3. The additional bones appear unremarkable. MRI 09/29/2019 right foot IMPRESSION: 1. Soft tissue ulceration lateral to the 5th MTP joint. There is low-level T2 hyperintensity within the 4th and 5th metatarsal heads and adjacent proximal phalanges without abnormal T1 signal or cortical destruction. These findings are nonspecific and could be seen with early marrow edema, hyperemia or early osteomyelitis. No evidence of septic joint. 2. Mild tenosynovitis and synovial enhancement associated with the extensor digitorum tendons at the level of the midfoot. 3. Diffuse low-level muscular T2 hyperintensity and enhancement, most consistent with diabetic myopathy. LEFT FOOT BONE Methicillin resistant staphylococcus aureus Staphylococcus lugdunensis MIC MIC CIPROFLOXACIN >=8 RESISTANT Resistant <=0.5 SENSI... Sensitive CLINDAMYCIN <=0.25 SENS... Sensitive >=8 RESISTANT Resistant ERYTHROMYCIN >=8 RESISTANT Resistant >=8 RESISTANT Resistant GENTAMICIN <=0.5 SENSI... Sensitive <=0.5 SENSI... Sensitive Inducible Clindamycin NEGATIVE Sensitive NEGATIVE Sensitive OXACILLIN >=4 RESISTANT Resistant 2 SENSITIVE Sensitive RIFAMPIN <=0.5 SENSI... Sensitive <=0.5 SENSI... Sensitive TETRACYCLINE <=1 SENSITIVE  Sensitive <=1 SENSITIVE Sensitive TRIMETH/SULFA <=10 SENSIT Sensitive <=10 SENSIT Sensitive ... Marland Kitchen.. VANCOMYCIN 1 SENSITIVE Sensitive <=0.5 SENSI... Sensitive Right foot bone . Component 3 wk ago Specimen Description BONE Special Requests RIGHT 4 METATARSAL SAMPLE B Gram Stain NO WBC SEEN NO ORGANISMS SEEN Culture RARE METHICILLIN RESISTANT STAPHYLOCOCCUS AUREUS NO ANAEROBES ISOLATED Performed at [facility], [place] [address] [place], [place] [address] Report Status 10/08/2019 FINAL Organism ID, Bacteria METHICILLIN RESISTANT STAPHYLOCOCCUS AUREUS Resulting Agency [facility] Susceptibility Methicillin resistant staphylococcus aureus MIC CIPROFLOXACIN >=8 RESISTANT Resistant CLINDAMYCIN <=0.25 SENS... Sensitive ERYTHROMYCIN >=8 RESISTANT Resistant GENTAMICIN <=0.5 SENSI... Sensitive Inducible Clindamycin NEGATIVE Sensitive OXACILLIN >=4 RESISTANT Resistant RIFAMPIN <=0.5 SENSI... Sensitive TETRACYCLINE <=1 SENSITIVE Sensitive TRIMETH/SULFA <=10 SENSIT Sensitive ... VANCOMYCIN 1 SENSITIVE Sensitive This is a patient we had in clinic earlier this year with a wound over his left fifth metatarsal head. 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 Doran Durand on 05/16/2019. He seems to have developed problems with wounds on his bilateral feet in June  2021 although he may have had problems earlier than this. He was in an urgent care with a right foot ulcer on 09/26/2019 and given a course of doxycycline. This was apparently after having trouble getting into see orthopedics. He was seen by podiatry on 09/28/2019 noted to have bilateral lower extremity ulcers including the left lateral fifth metatarsal base and the  right subfifth met head. It was noted that had purulent drainage at that time. He required hospitalization from 6/20 through 7/2. This was because of worsening right foot wounds. He underwent bilateral operative incision and drainage and bone biopsies bilaterally. Culture results are listed above. He has been referred back to clinic by Dr. Jacqualyn Posey of podiatry. He is also followed by Dr. Megan Salon who saw him yesterday. He was discharged from hospital on Zyvox Flagyl and Levaquin and yesterday changed to doxycycline Flagyl and Levaquin. His inflammatory markers on 6/26 showed a sedimentation rate of 129 and a C-reactive protein of 5. This is improved to 14 and 1.3 respectively. This would indicate improvement. ABIs in our clinic today were 1.23 on the right and 1.20 on the left 11/01/2019 on evaluation today patient appears to be doing fairly well in regard to the wounds on his feet at this point. Fortunately there is no signs of active infection at this time. No fevers, chills, nausea, vomiting, or diarrhea. He currently is seeing infectious disease and still under their care at this point. Subsequently he also has both wounds which she has not been using collagen on as he did not receive that in his packaging he did not call us and let us know that. Apparently that just was missed on the order. Nonetheless we will get that straightened out today. 8/9-Patient returns for bilateral foot wounds, using Prisma with hydrogel moistened dressings, and the wounds appear stable. Patient using surgical shoes, avoiding much pressure or weightbearing as much as possible 8/16; patient has bilateral foot wounds. 1 on the right lateral foot proximally the other is on the left mid lateral foot. Both required debridement of callus and thick skin around the wounds. We have been using silver collagen 8/27; patient has bilateral lateral foot wounds. The area on the left substantially surrounded by callus and dry skin. This  was removed from the wound edge. The underlying wound is small. The area on the right measured somewhat smaller today. 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 Unfortunately I did not update his antibiotics during today's visit. 9/10 I reviewed Dr. Hale Bogus last notes he felt he had completed antibiotics his inflammatory markers were reasonably well controlled. He has a small wound on the lateral left foot and a tiny area on the right which is just above closed. He is using Hydrofera Blue with border foam he has bilateral surgical shoes 9/24; 2 week f/u. doing well. right foot is closed. left foot still undermined. 10/14; right foot remains closed at the fifth met head. The area over the base of the left fifth metatarsal has a small open area but considerable undermining towards the plantar foot. Thick callus skin around this suggests an adequate pressure relief. We have talked about this. He says he is going to go back into his cam boot. I suggested a total contact cast he did not seem enamored with this suggestion 10/26; left foot base of the fifth metatarsal. Same condition as last time. He has skin over the area with an open wound however the  skin is not adherent. He went to see Dr. Earleen Newport who did an x-ray and culture of his foot I have not reviewed the x-ray but the patient was not told anything. He is on doxycycline 11/11; since the patient was last here he was in the emergency room on 10/30 he was concerned about swelling in the left foot. They did not do any cultures or x-rays. They changed his antibiotics to cephalexin. Previous culture showed group B strep. The cephalexin is appropriate as doxycycline has less than predictable coverage. Arrives in clinic today with swelling over this area under the wound. He also has a new wound on the right fifth metatarsal head 11/18; the patient has a difficult wound on the lateral  aspect of the left fifth metatarsal head. The wound was almost ballotable last week I opened it slightly expecting to see purulence however there was just bleeding. I cultured this this was negative. X-ray unchanged. We are trying to get an MRI but I am not sure were going to be able to get this through his insurance. He also has an area on the right lateral fifth metatarsal head this looks healthier 12/3; the patient finally got our MRI. Surprisingly this did not show osteomyelitis. I did show the soft tissue ulceration at the lateral plantar aspect of the fifth metatarsal base with a tiny residual 6 mm abscess overlying the superficial fascia I have tried to culture this area I have not been able to get this to grow anything. Nevertheless the protruding tissue looks aggravated. I suspect we should try to treat the underlying "abscess with broad-spectrum antibiotics. I am going to start him on Levaquin and Flagyl. He has much less edema in his legs and I am going to continue to wrap his legs and see him weekly 12/10. I started Levaquin and Flagyl on him last week. He just picked up the Flagyl apparently there was some delay. The worry is the wound on the left fifth metatarsal base which is substantial and worsening. His foot looks like he inverts at the ankle making this a weightbearing surface. Certainly no improvement in fact I think the measurements of this are somewhat worse. We have been using 12/17; he apparently just got the Levaquin yesterday this is 2 weeks after the fact. He has completed the Flagyl. The area over the left fifth metatarsal base still has protruding granulation tissue although it does not look quite as bad as it did some weeks ago. He has severe bilateral lymphedema although we have not been treating him for wounds on his legs this is definitely going to require compression. There was so much edema in the left I did not wish to put him in a total contact cast today. I am  going to increase his compression from 3-4 layer. The area on the right lateral fifth met head actually look quite good and superficial. 12/23; patient arrived with callus on the right fifth met head and the substantial hyper granulated callused wound on the base of his fifth metatarsal. He says he is completing his Levaquin in 2 days but I do not think that adds up with what I gave him but I will have to double check this. We are using Hydrofera Blue on both areas. My plan is to put the left leg in a cast the week after New Year's 04/06/2020; patient's wounds about the same. Right lateral fifth metatarsal head and left lateral foot over the base of the fifth metatarsal. There is  undermining on the left lateral foot which I removed before application of total contact cast continuing with Hydrofera Blue new. Patient tells me he was seen by endocrinology today lab work was done [Dr. Kerr]. Also wondering whether he was referred to cardiology. I went over some lab work from previously does not have chronic renal failure certainly not nephrotic range proteinuria he does have very poorly controlled diabetes but this is not his most updated lab work. Hemoglobin A1c has been over 11 1/10; the patient had a considerable amount of leakage towards mid part of his left foot with macerated skin however the wound surface looks better the area on the right lateral fifth met head is better as well. I am going to change the dressing on the left foot under the total contact cast to silver alginate, continue with Hydrofera Blue on the right. 1/20; patient was in the total contact cast for 10 days. Considerable amount of drainage although the skin around the wound does not look too bad on the left foot. The area on the right fifth metatarsal head is closed. Our nursing staff reports large amount of drainage out of the left lateral foot wound 1/25; continues with copious amounts of drainage described by our intake staff.  PCR culture I did last week showed E. coli and Enterococcus faecalis and low quantities. Multiple resistance genes documented including extended spectrum beta lactamase, MRSA, MRSE, quinolone, tetracycline. The wound is not quite as good this week as it was 5 days ago but about the same size 2/3; continues with copious amounts of malodorous drainage per our intake nurse. The PCR culture I did 2 weeks ago showed E. coli and low quantities of Enterococcus. There were multiple resistance genes detected. I put Neosporin on him last week although this does not seem to have helped. The wound is slightly deeper today. Offloading continues to be an issue here although with the amount of drainage she has a total contact cast is just not going to work 2/10; moderate amount of drainage. Patient reports he cannot get his stocking on over the dressing. I told him we have to do that the nurse gave him suggestions on how to make this work. The wound is on the bottom and lateral part of his left foot. Is cultured predominantly grew low amounts of Enterococcus, E. coli and anaerobes. There were multiple resistance genes detected including extended spectrum beta lactamase, quinolone, tetracycline. I could not think of an easy oral combination to address this so for now I am going to do topical antibiotics provided by [facility] I think the main agents here are vancomycin and an aminoglycoside. We have to be able to give him access to the wounds to get the topical antibiotic on 2/17; moderate amount of drainage this is unchanged. He has his Keystone topical antibiotic against the deep tissue culture organisms. He has been using this and changing the dressing daily. Silver alginate on the wound surface. 2/24; using Keystone antibiotic with silver alginate on the top. He had too much drainage for a total contact cast at one point although I think that is improving and I think in the next week or 2 it might be possible to  replace a total contact cast I did not do this today. In general the wound surface looks healthy however he continues to have thick rims of skin and subcutaneous tissue around the wide area of the circumference which I debrided 06/04/2020 upon evaluation today patient appears to be doing  well in regard to his wound. I do feel like he is showing signs of improvement. There is little bit of callus and dead tissue around the edges of the wound as well as what appears to be a little bit of a sinus tract that is off to the side laterally I would perform debridement to clear that away today. 3/17; left lateral foot. The wound looks about the same as I remember. Not much depth surface looks healthy. No evidence of infection 3/25; left lateral foot. Wound surface looks about the same. Separating epithelium from the circumference. There really is no evidence of infection here however not making progress by my view 3/29; left lateral foot. Surface of the wound again looks reasonably healthy still thick skin and subcutaneous tissue around the wound margins. There is no evidence of infection. One of the concerns being brought up by the nurses has again the amount of drainage vis--vis continued use of a total contact cast 4/5; left lateral foot at roughly the base of the fifth metatarsal. Nice healthy looking granulated tissue with rims of epithelialization. The overall wound measurements are not any better but the tissue looks healthy. The only concern is the amount of drainage although he has no surrounding maceration with what we have been doing recently to absorb fluid and protect his skin. He also has lymphedema. 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 He states  that the kerlix/coban under the TCC rolled down and could not get it back up. He has been using an offloading scooter and has somehow hurt his right foot using this device. This happened last week. He states that the side of his right foot developed a blister and opened. The top of his foot also has a few small open wounds he thinks is due to his socks rubbing in his shoes. He has not been using any dressings to the wound. He denies purulent drainage, fever/chills or erythema to the wounds. 4/22; patient presents for 1 week follow-up. He developed new wounds to the right foot that were evaluated at last clinic visit. He continues to have a total contact cast to the left leg and he reports no issues. He has been using silver collagen to the right foot wounds with no issues. He denies purulent drainage, fever/chills or erythema to the right foot wounds. He has no complaints today 4/25; patient presents for 1 week follow-up. He has a total contact cast of the left leg and reports no issues. He has been using silver alginate to the right foot wound. He denies purulent drainage, fever/chills or erythema to the right foot wounds. 5/2 patient presents for 1 week follow-up. T contact cast on the left. The wound which is on the base of the plantar foot at the base of the fifth metatarsal otal actually looks quite good and dimensions continue to gradually contract. HOWEVER the area on the right lateral fifth metatarsal head is much larger than what I remember from 2 weeks ago. Once more is he has significant levels of hypergranulation. Noteworthy that he had this same hyper granulated response on his wound on the left foot at one point in time. So much so that he I thought there was an underlying fluid  collection. Based on this I think this just needs debridement. 5/9; the wound on the left actually continues to be gradually smaller with a healthy surface. Slight amount of drainage and maceration of the skin  around but not too bad. However he has a large wound over the right fifth metatarsal head very much in the same configuration as his left foot wound was initially. I used silver nitrate to address the hyper granulated tissue no mechanical debridement 5/16; area on the left foot did not look as healthy this week deeper thick surrounding macerated skin and subcutaneous tissue. oo The area on the right foot fifth met head was about the same oo The area on the right ankle that we identified last week is completely broken down into an open wound presumably a stocking rubbing issue 5/23; patient has been using a total contact cast to the left side. He has been using silver alginate underneath. He has also been using silver alginate to the right foot wounds. He has no complaints today. He denies any signs of infection. 5/31; the left-sided wound looks some better measure smaller surface granulation looks better. We have been using silver alginate under the total contact cast oo The large area on his right fifth met head and right dorsal foot look about the same still using silver alginate 6/6; neither side is good as I was hoping although the surface area dimensions are better. A lot of maceration on his left and right foot around the wound edge. Area on the dorsal right foot looks better. He says he was traveling. I am not sure what does the amount of maceration around the plantar wounds may be drainage issues 6/13; in general the wound surfaces look quite good on both sides. Macerated skin and raised edges around the wound required debridement although in general especially on the left the surface area seems improved. oo The area on the right dorsal ankle is about the same I thought this would not be such a problem to close 6/20; not much change in either wound although the one on the right looks a little better. Both wounds have thick macerated edges to the skin requiring debridements. We have been  using silver alginate. The area on the dorsal right ankle is still open I thought this would be closed. 6/28; patient comes in today with a marked deterioration in the right foot wound fifth met head. Wide area of exposed bone this is a drastic change from last time. The area on the left there we have been casting is stagnant. We have been using silver alginate in both wound areas. 7/5; bone culture I did for PCR last time was positive for Pseudomonas, group B strep, Enterococcus and Staph aureus. There was no suggestion of methicillin resistance or ampicillin resistant genes. This was resistant to tetracycline however He comes into the clinic today with the area over his right plantar fifth metatarsal head which had been doing so well 2 weeks ago completely necrotic feeling bone. I do not know that this is going to be salvageable. The left foot wound is certainly no smaller but it has a better surface and is superficial. 7/8; patient called in this morning to say that his total contact cast was rubbing against his foot. He states he is doing fine overall. He denies signs of infection. 7/12; continued deterioration in the wound over the right fifth metatarsal head crumbling bone. This is not going to be salvageable. The patient agrees and wants to  be referred to Dr. Doran Durand which we will attempt to arrange as soon as possible. I am going to continue him on antibiotics as long as that takes so I will renew those today. The area on the left foot which is the base of the fifth metatarsal continues to look somewhat better. Healthy looking tissue no depth no debridement is necessary here. 7/20; the patient was kindly seen by Dr. Doran Durand of orthopedics on 10/19/2020. 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 Towards this end we have taken him out of the total contact cast on the left we will put him in a wrap with  Hydrofera Blue. As I understand things surgery is planned for 7/21 7/27; patient had his surgery last Thursday. 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 Apparently everything went well we did not still disturb that today The area on the left foot actually looks quite good. He has been much less mobile which probably explains this he did not seem to do well in the total contact cast secondary to drainage and maceration I think. 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 Fortunately there is no evidence of active infection at this time. No fevers, chills, nausea, vomiting, or diarrhea. Overall I think that he is actually doing extremely well. Nonetheless I do believe that he is staying off of this more following the surgery in his right foot that is the reason the left is doing so great. 8/16; left plantar foot wound. This looks smaller than the last time I saw this he is using Hydrofera Blue. The surgical wound on the right foot is being followed by Dr. Doran Durand we did not look at this today. He has surgical shoes on both feet 8/23; left plantar foot wound not as good this week. Surrounding macerated skin and subcutaneous tissue everything looks moist and wet. I do not think he is offloading this adequately. He is using a surgical shoe Apparently the right foot surgical wound is not open although I did not check his foot 8/31; left plantar foot lateral aspect. Much improved this week. He has no maceration. Some improvement in the surface area of the wound but most impressively the depth is come in we are using silver alginate. The patient is a Product/process development scientist. He is asked that we write him a letter so he can go back to work. I have also tried to see if we can write something that will allow him to limit the amount of time that he is on his foot at work. Right now he tells me his  classrooms are next door to each other however he has to supervise lunch which is well across. Hopefully the latter can be avoided 9/6; I believe the patient missed an appointment last week. He arrives in today with a wound looking roughly the same certainly no better. Undermining laterally and also inferiorly. We used molecuLight today in training with the patient's permission.. We are using silver alginate 9/21 wound is measuring bigger this week although this may have to do with the aggressive circumferential debridement last week in response to the blush fluorescence on the MolecuLight. Culture I did last week showed significant MSSA and E. coli. I put him on Augmentin but he has not started it  yet. We are also going to send this for compounded antibiotics at [facility]. There is no evidence of systemic infection 9/29; silver alginate. His Keystone arrived. He is completing Augmentin in 2 days. Offloading in a cam boot. Moderate drainage per our intake staff 10/5; using silver alginate. He has been using his [place]. He has completed his Augmentin. Per our intake nurse still a lot of drainage, far too much to consider a total contact cast. Wound measures about the same. He had the same undermining area that I defined last week from a roughly 11-3. I remove this today 10/12; using silver alginate he is using the [place]. He comes in for a nurse visit hence we are applying [employer] twice a week. Measuring slightly better today and less notable drainage. Extensive debridement of the wound edge last time 10/18; using topical Keystone and silver alginate and a soft cast. Wound measurements about the same. Drainage was through his soft cast. We are changing this twice a week Tuesdays and Friday 10/25; comes in with moderate drainage. Still using Keystone silver alginate and a soft cast. Wound dimensions completely the same.He has a lot of edema in the left leg he has lymphedema. Asking for [name] to  consider wrapping him as he cannot get his stocking on over the soft cast 11/2; comes in with moderate to large drainage slightly smaller in terms of width we have been using [place]. His wound looks satisfactory but not much improvement 11/4; patient presents today for obligatory cast change. Has no issues or complaints today. He denies signs of infection. 11/9; patient traveled this weekend to [place], was on the cast quite a bit. Staining of the cast with black material from his walking boot. Drainage was not quite as bad as we feared. Using silver alginate and Keystone 11/16; we do not have size for cast therefore we have been putting a soft cast on him since the change on Friday. Still a significant amount of drainage necessitating changing twice a week. We have been using the Keystone at cast changes either hard or soft as well as silver alginate Comes in the clinic with things actually looking fairly good improvement in width. He says his offloading is about the same 02/24/2021 upon evaluation today patient actually comes back in and is doing excellent in regard to his foot ulcer this is significantly smaller even compared to the last visit. The soft cast seems to have done extremely well for him which is great news. I do not see any signs of infection minimal debridement will be needed today. 11/30; left lateral foot much improved half a centimeter improvement in surface area. No evidence of infection. He seems to be doing better with the soft cast in the TCC therefore we will continue with this. He comes back in later in the week for a change with the nurses. This is due to drainage 12/6; no improvement in dimensions. Under illumination some debris on the surface we have been using silver alginate, soft cast. If there is anything optimistic here he seems to have have less drainage 12/13. Dimensions are improved both length and width and slightly in depth. Appears to be quite healthy today.  Raised edges of this thick skin and callus around the edges however. He is in a soft cast were bringing him back once for a change on Friday. Drainage is better 12/20. Dimensions are improved. He still has raised edges of thick skin and callus around the edges. We are using a soft  cast Objective Constitutional Patient is hypertensive.. Pulse regular and within target range for patient.Marland Kitchen Respirations regular, non-labored and within target range.. Temperature is normal and within the target range for the patient.Marland Kitchen Appears in no distress. Vitals Time Taken: 12:51 PM, Height: 77 in, Weight: 280 lbs, BMI: 33.2, Temperature: 98.3 F, Pulse: 80 bpm, Respiratory Rate: 17 breaths/min, Blood Pressure: 150/87 mmHg, Capillary Blood Glucose: 200 mg/dl. Cardiovascular Pedal pulses palpable on the left. General Notes: Wound exam; wound is measuring smaller. Surface looks healthy. I did not debride the thick edges around the wound today we will follow the measurements and determine need for further debridement. There is a scant amount of undermining at 1-3 o'clock Integumentary (Hair, Skin) Wound #3 status is Open. Original cause of wound was Trauma. The date acquired was: 10/02/2019. The wound has been in treatment 73 weeks. The wound is located on the Left,Lateral Foot. The wound measures 1.6cm length x 1.4cm width x 0.3cm depth; 1.759cm^2 area and 0.528cm^3 volume. There is Fat Layer (Subcutaneous Tissue) exposed. There is no tunneling or undermining noted. There is a medium amount of serosanguineous drainage noted. The wound margin is distinct with the outline attached to the wound base. There is large (67-100%) red, pink granulation within the wound bed. There is a small (1-33%) amount of necrotic tissue within the wound bed including Adherent Slough. 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 An Soft Cast procedure was performed by [name], MD. Post procedure Diagnosis Wound #3: Same as Pre-Procedure Plan Follow-up Appointments: Return Appointment in 1 week. - Dr. Dellia Nims Nurse Visit: - Thursday 12/22 to change soft cast Bathing/ Shower/ Hygiene: May shower with protection but do not get wound dressing(s) wet. Edema Control - Lymphedema / SCD / Other: Elevate legs to the level of the heart or above for 30 minutes daily and/or when sitting, a frequency of: - throughout the day Avoid standing for long periods of time. Exercise regularly Moisturize legs daily. - right leg every night before bed. Compression stocking or Garment 20-30 mm/Hg pressure to: - Apply to right leg in the morning and remove at night. Off-Loading: Other: - minimal weight bearing left foot Other: - Soft cast to left foot - wrap leg with kerlix/coban to reduce swelling Additional Orders / Instructions: Follow Nutritious Diet WOUND #3: - Foot Wound Laterality: Left, Lateral Cleanser: Soap and Water 2 x Per Week/30 Days Discharge Instructions: May shower and wash wound with dial antibacterial soap and water prior to dressing change. Cleanser: Wound Cleanser (Generic) 2 x Per Week/30 Days Discharge Instructions: Cleanse the wound with wound cleanser prior to applying a clean dressing using gauze sponges, not tissue or cotton balls. Peri-Wound Care: Zinc Oxide Ointment 30g tube 2 x Per Week/30 Days Discharge Instructions: Apply Zinc Oxide to periwound with each dressing change as needed. Topical: keystone antibiotic compound 2 x Per Week/30 Days Discharge Instructions: thin layer to wound bed Prim Dressing: KerraCel Ag Gelling Fiber Dressing, 4x5 in (silver alginate) (Generic) 2 x Per Week/30 Days ary Discharge Instructions: Apply silver alginate to  wound bed as instructed Secondary Dressing: ABD Pad, 8x10 2 x Per Week/30 Days Discharge Instructions: Apply over primary dressing as directed. Secondary Dressing: Zetuvit Plus 4x8 in (Generic) 2 x Per Week/30 Days Discharge Instructions:  Apply over primary dressing as directed. Com pression Wrap: Kerlix Roll 4.5x3.1 (in/yd) 2 x Per Week/30 Days Discharge Instructions: Apply Kerlix and Coban compression as directed. Com pression Wrap: Coban Self-Adherent Wrap 4x5 (in/yd) 2 x Per Week/30 Days Discharge Instructions: Apply over Kerlix as directed. 1. Continue with silver alginate./ABDs/soft cast 2. Drainage has improved 3. Careful observation of surface area and depth next week he may require debridement of the wound edge again. I did not do this today Electronic Signature(s) Signed: 03/23/2021 4:29:35 PM By: [name] MD Signed: 03/23/2021 5:35:33 PM By: [name] RN, BSN Entered By: [name] on 03/23/2021 13:27:28 -------------------------------------------------------------------------------- SuperBill Details Patient Name: Date of Service: [name], [name] 03/23/2021 Medical Record Number: [id number] Patient Account Number: [id number] Date of Birth/Sex: Treating RN: Feb 14, 1986 (35 y.o. [name] Primary Care Provider: Seward [name] Other Clinician: Referring Provider: Treating Provider/Extender: [name] in Treatment: 73 Diagnosis Coding ICD-10 Codes Code Description E11.621 Type 2 diabetes mellitus with foot ulcer L97.528 Non-pressure chronic ulcer of other part of left foot with other specified severity Facility Procedures CPT4 Code: 33295188 Description: 41660 Application of clubfoot cast with molding or manipulation, long or short leg Modifier: Quantity: 1 Physician Procedures : CPT4 Code Description Modifier [id number] - WC PHYS LEVEL 3 - EST PT ICD-10 Diagnosis Description E11.621 Type 2 diabetes mellitus with  foot ulcer L97.528 Non-pressure chronic ulcer of other part of left foot with other specified severity Quantity: 1 Electronic Signature(s) Signed: 03/25/2021 3:43:54 PM By: [name] MD Signed: 03/25/2021 4:57:12 PM By: [name] RN, BSN Previous Signature: 03/23/2021 4:29:35 PM Version By: [name] MD Entered By: [name] on 03/24/2021 17:05:08

## 2021-03-25 ENCOUNTER — Encounter (HOSPITAL_BASED_OUTPATIENT_CLINIC_OR_DEPARTMENT_OTHER): Payer: BC Managed Care – PPO | Admitting: Internal Medicine

## 2021-03-25 ENCOUNTER — Other Ambulatory Visit: Payer: Self-pay

## 2021-03-25 DIAGNOSIS — E11621 Type 2 diabetes mellitus with foot ulcer: Secondary | ICD-10-CM | POA: Diagnosis not present

## 2021-03-25 NOTE — Progress Notes (Signed)
[place], [name] ([id number]) Visit Report for 03/25/2021 Soft Cast Details Patient Name: Date of Service: [name] D 03/25/2021 9:00 A M Medical Record Number: [id number] Patient Account Number: [id number] Date of Birth/Sex: Treating RN: 08-14-1985 (35 y.o. [name] Primary Care Provider: [name] Other Clinician: Referring Provider: Treating Provider/Extender: Gwyneth Revels in Treatment: 97 Procedure Performed for: Wound #3 Left,Lateral Foot Performed By: Clinician Fonnie Mu, RN Electronic Signature(s) Signed: 03/25/2021 3:43:54 PM By: Baltazar Najjar MD Signed: 03/25/2021 5:08:09 PM By: Shawn Stall RN, BSN Entered By: Shawn Stall on 03/25/2021 13:16:15 -------------------------------------------------------------------------------- SuperBill Details Patient Name: Date of Service: [name], [name] 03/25/2021 Medical Record Number: [id number] Patient Account Number: [id number] Date of Birth/Sex: Treating RN: [birth year] (35 y.o. [name] Primary Care Provider: [name] Other Clinician: Referring Provider: Treating Provider/Extender: Gwyneth Revels in Treatment: 74 Diagnosis Coding ICD-10 Codes Code Description E11.621 Type 2 diabetes mellitus with foot ulcer L97.528 Non-pressure chronic ulcer of other part of left foot with other specified severity Facility Procedures CPT4 Code: [id number] Description: [phone] Application of clubfoot cast with molding or manipulation, long or short leg Modifier: Quantity: 1 Electronic Signature(s) Signed: 03/25/2021 3:43:54 PM By: Baltazar Najjar MD Signed: 03/25/2021 5:08:09 PM By: Shawn Stall RN, BSN Entered By: Shawn Stall on 03/25/2021 13:16:55

## 2021-03-26 IMAGING — CT CT ANGIO CHEST
2 of 6 series · 18 of 46 positions shown · IV contrast (APPLIED)
Comparison: CT chest dated June 30, 2017.

CLINICAL DATA: Shortness of breath.

EXAM:
CT ANGIOGRAPHY CHEST WITH CONTRAST
TECHNIQUE: Multidetector CT imaging of the chest was performed using the
standard protocol during bolus administration of intravenous
contrast. Multiplanar CT image reconstructions and MIPs were
obtained to evaluate the vascular anatomy.
CONTRAST:  100mL OMNIPAQUE IOHEXOL 350 MG/ML SOLN

[Series 5: thins · axial · 0.70mm/px · z∈[-289,-22]mm · 16 of 293 slices shown]
[im 13/293  lung]
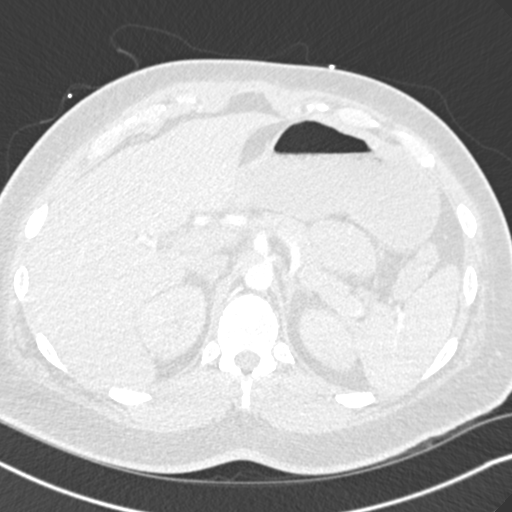
[im 39/293  soft-tissue]
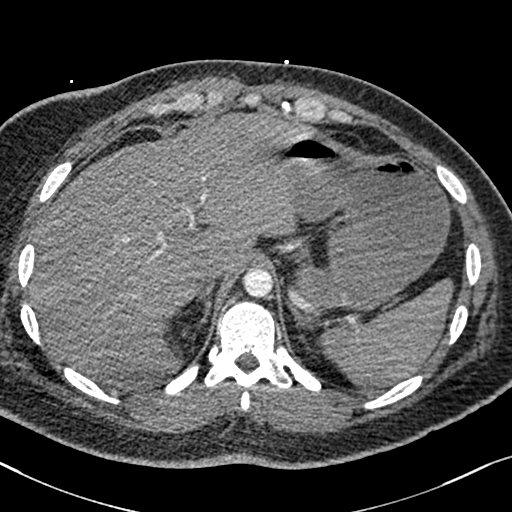
[im 51/293  lung]
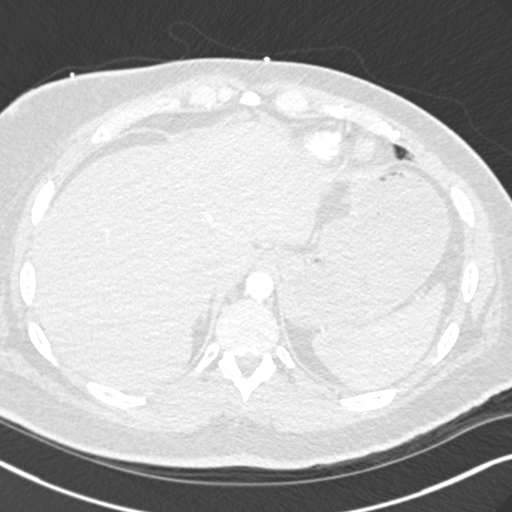
[im 64/293  soft-tissue]
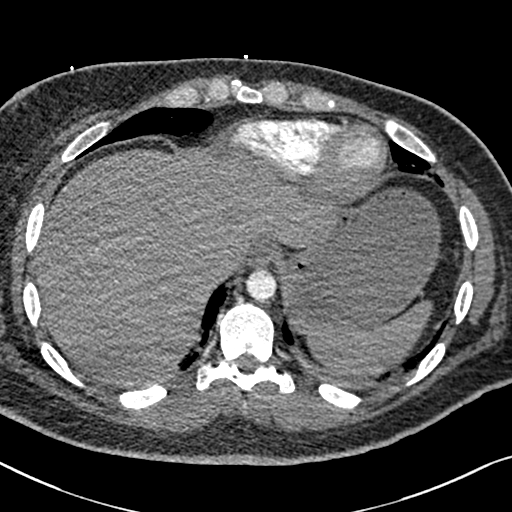
[im 89/293  lung]
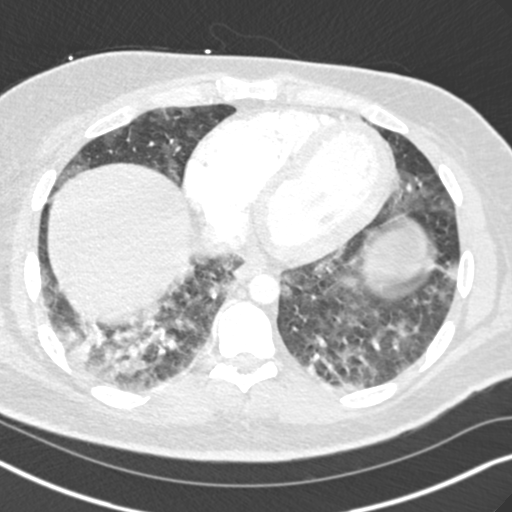
[im 102/293  soft-tissue]
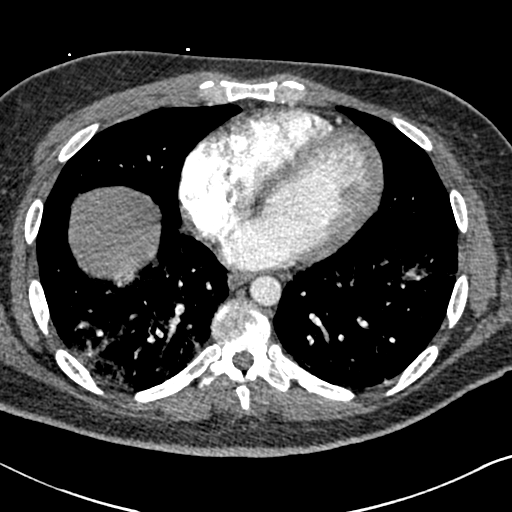
[im 115/293  lung]
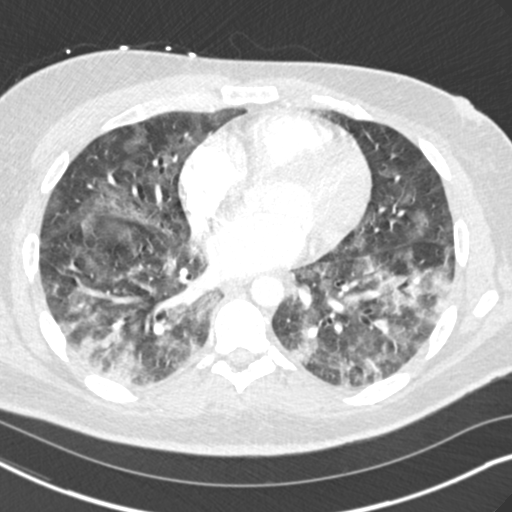
[im 140/293  soft-tissue]
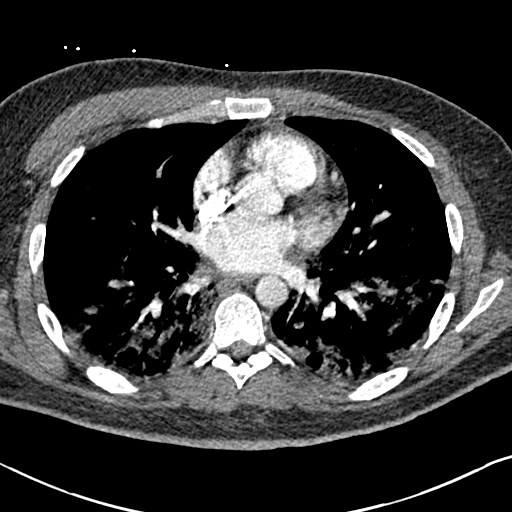
[im 153/293  lung]
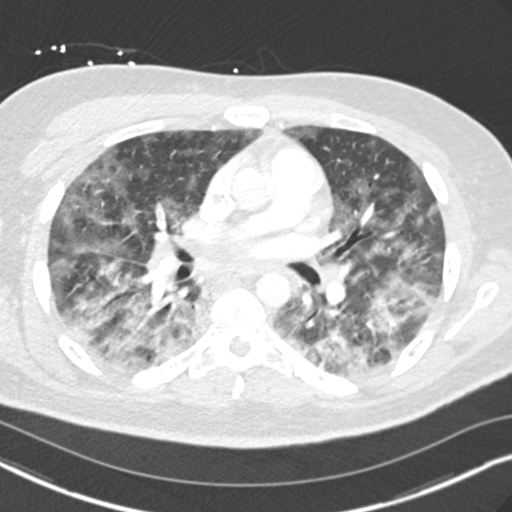
[im 178/293  soft-tissue]
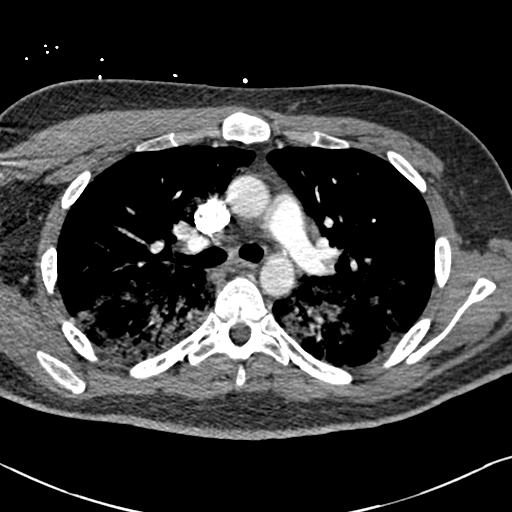
[im 191/293  lung]
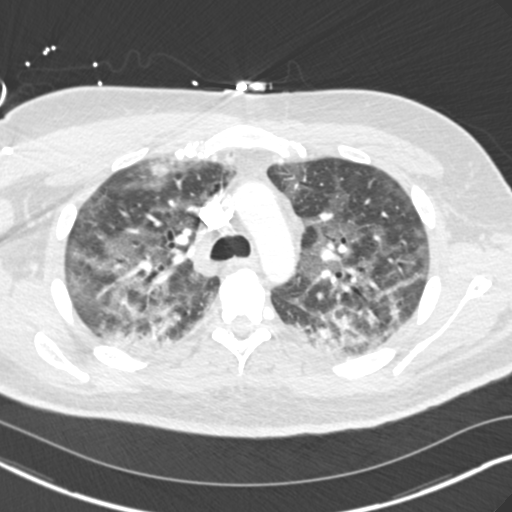
[im 204/293  soft-tissue]
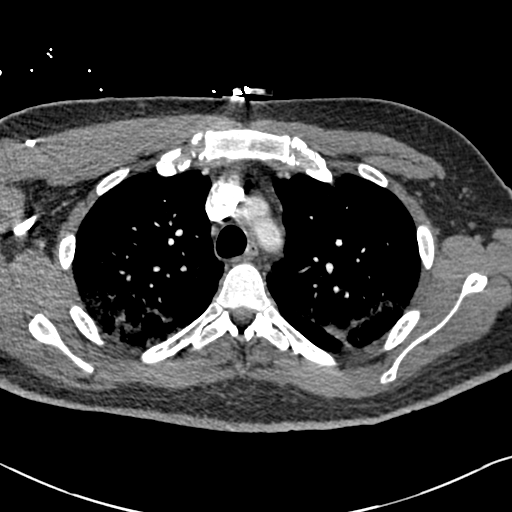
[im 229/293  lung]
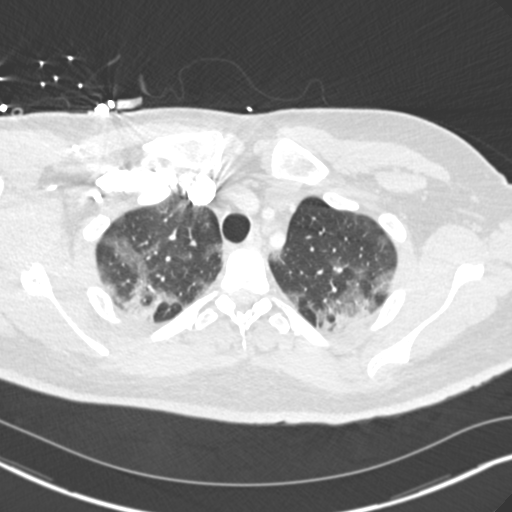
[im 242/293  soft-tissue]
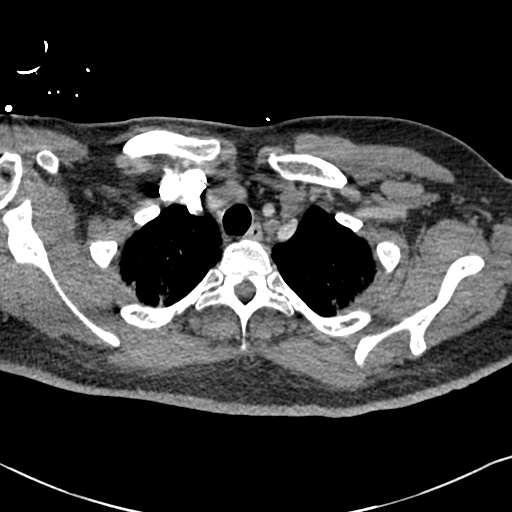
[im 254/293  lung]
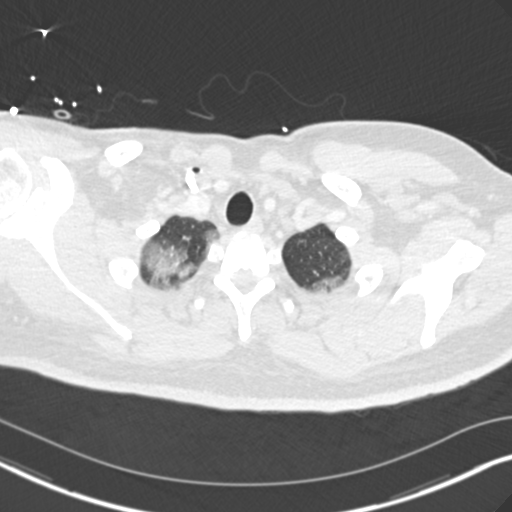
[im 280/293  soft-tissue]
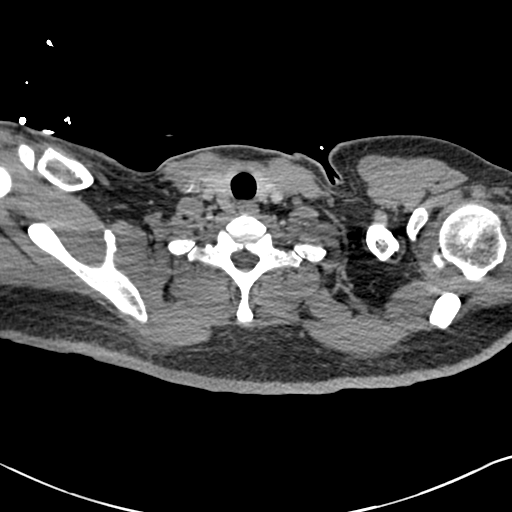

[Series 7: coronal mpr · coronal · 0.59mm/px · 2 of 95 slices shown]
[im 32/95  soft-tissue]
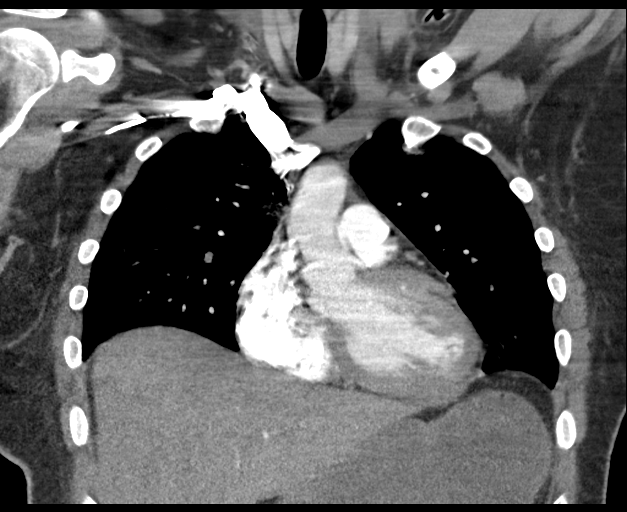
[im 63/95  soft-tissue]
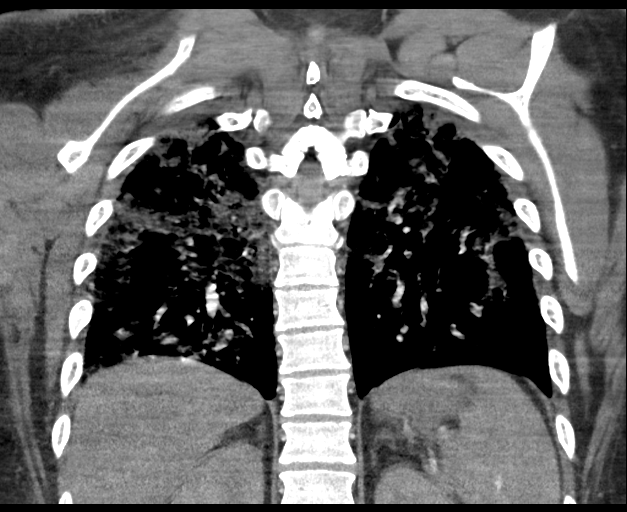

[18 of 46 positions shown; findings below may reference images not displayed]

FINDINGS: Cardiovascular: Evaluation for pulmonary emboli is severely limited
by extensive respiratory motion artifact and suboptimal contrast
bolus timing. There is no large centrally located pulmonary embolus.
Detection of subsegmental and segmental pulmonary emboli is
essentially nondiagnostic.The heart size is mildly enlarged. There
is no significant pericardial effusion. No significant
atherosclerotic changes of the thoracic aorta. No aneurysm of the
thoracic aorta.

Mediastinum/Nodes:

--there are enlarged mediastinal

--there is no significant axillary adenopathy.

--No supraclavicular lymphadenopathy.

--Normal thyroid gland.

--The esophagus is unremarkable

Lungs/Pleura: There are extensive ground-glass airspace opacities
bilaterally with more focal areas of consolidation at the lung
bases. There is no significant pleural effusion. The trachea is
unremarkable.

Upper Abdomen: No acute abnormality.

Musculoskeletal: No chest wall abnormality. No acute or significant
osseous findings.

Review of the MIP images confirms the above findings.
IMPRESSION: 1. Very limited study for the detection of pulmonary emboli. There
is no large centrally located pulmonary embolus. Detection of
smaller lobar, segmental, and subsegmental pulmonary emboli is
severely limited.
2. Extensive ground-glass bilateral airspace opacities consistent
with the patient's history of viral pneumonia. There are more focal
areas of consolidation at the lung bases bilaterally.
3. Mediastinal and hilar adenopathy, presumably reactive.

## 2021-03-30 ENCOUNTER — Encounter (HOSPITAL_BASED_OUTPATIENT_CLINIC_OR_DEPARTMENT_OTHER): Payer: Self-pay | Admitting: *Deleted

## 2021-03-30 ENCOUNTER — Emergency Department (HOSPITAL_BASED_OUTPATIENT_CLINIC_OR_DEPARTMENT_OTHER)
Admission: EM | Admit: 2021-03-30 | Discharge: 2021-03-30 | Disposition: A | Discharge status: Home / Self Care | Payer: BC Managed Care – PPO | Attending: Emergency Medicine | Admitting: Emergency Medicine

## 2021-03-30 ENCOUNTER — Emergency Department (HOSPITAL_BASED_OUTPATIENT_CLINIC_OR_DEPARTMENT_OTHER): Payer: BC Managed Care – PPO

## 2021-03-30 ENCOUNTER — Other Ambulatory Visit: Payer: Self-pay

## 2021-03-30 DIAGNOSIS — Y92410 Unspecified street and highway as the place of occurrence of the external cause: Secondary | ICD-10-CM | POA: Insufficient documentation

## 2021-03-30 DIAGNOSIS — E109 Type 1 diabetes mellitus without complications: Secondary | ICD-10-CM | POA: Insufficient documentation

## 2021-03-30 DIAGNOSIS — Z794 Long term (current) use of insulin: Secondary | ICD-10-CM | POA: Insufficient documentation

## 2021-03-30 DIAGNOSIS — M25512 Pain in left shoulder: Secondary | ICD-10-CM | POA: Diagnosis not present

## 2021-03-30 DIAGNOSIS — M542 Cervicalgia: Secondary | ICD-10-CM | POA: Insufficient documentation

## 2021-03-30 DIAGNOSIS — Z8616 Personal history of COVID-19: Secondary | ICD-10-CM | POA: Insufficient documentation

## 2021-03-30 MED ORDER — LIDOCAINE 5 % EX PTCH: 1.0000 | MEDICATED_PATCH | CUTANEOUS | 0 refills | Status: DC

## 2021-03-30 MED ORDER — NAPROXEN 500 MG PO TABS: 500.0000 mg | ORAL_TABLET | Freq: Two times a day (BID) | ORAL | 0 refills | Status: DC
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
## 2021-03-30 NOTE — Progress Notes (Signed)
[place], [name] ([id number]) Visit Report for 03/25/2021 Arrival Information Details Patient Name: Date of Service: [name] 03/25/2021 9:00 A M Medical Record Number: [id number] Patient Account Number: [id number] Date of Birth/Sex: Treating RN: 03/07/1986 (34 y.o. [name], [name] Primary Care Shekera Beavers: [name] Other Clinician: Referring Lillar Bianca: Treating Kiran Carline/Extender: [name] in Treatment: 60 Visit Information History Since Last Visit Added or deleted any medications: No Patient Arrived: Ambulatory Any new allergies or adverse reactions: No Arrival Time: 09:10 Had a fall or experienced change in No Accompanied By: self activities of daily living that may affect Transfer Assistance: None risk of falls: Patient Identification Verified: Yes Signs or symptoms of abuse/neglect since last visito No Secondary Verification Process Completed: Yes Hospitalized since last visit: No Patient Requires Transmission-Based Precautions: No Implantable device outside of the clinic excluding No Patient Has Alerts: No cellular tissue based products placed in the center since last visit: Has Dressing in Place as Prescribed: Yes Has Compression in Place as Prescribed: Yes Pain Present Now: No Electronic Signature(s) Signed: 03/30/2021 1:16:02 PM By: Rhae Hammock RN Entered By: Rhae Hammock on 03/25/2021 09:11:05 -------------------------------------------------------------------------------- Encounter Discharge Information Details Patient Name: Date of Service: [name], [name] 03/25/2021 9:00 A M Medical Record Number: [id number] Patient Account Number: [id number] Date of Birth/Sex: Treating RN: October 07, 1985 (35 y.o. [name] Primary Care Kelisha Dall: [name] Other Clinician: Referring Teri Legacy: Treating Kyri Shader/Extender: [name] in Treatment: 27 Encounter Discharge Information  Items Discharge Condition: Stable Ambulatory Status: Ambulatory Discharge Destination: Home Transportation: Private Auto Accompanied By: self Schedule Follow-up Appointment: Yes Clinical Summary of Care: Electronic Signature(s) Signed: 03/25/2021 5:08:09 PM By: Deon Pilling RN, BSN Entered By: Deon Pilling on 03/25/2021 13:16:47 -------------------------------------------------------------------------------- Patient/Caregiver Education Details Patient Name: Date of Service: [name], [name] 12/22/2022andnbsp9:00 A M Medical Record Number: [id number] Patient Account Number: [id number] Date of Birth/Gender: Treating RN: October 18, 1985 (35 y.o. [name] Primary Care Physician: [name] Other Clinician: Referring Physician: Treating Physician/Extender: [name] in Treatment: 44 Education Assessment Education Provided To: Patient Education Topics Provided Wound/Skin Impairment: Handouts: Skin Care Do's and Dont's Methods: Explain/Verbal Responses: Reinforcements needed Electronic Signature(s) Signed: 03/25/2021 5:08:09 PM By: Deon Pilling RN, BSN Entered By: Deon Pilling on 03/25/2021 13:16:36 -------------------------------------------------------------------------------- Wound Assessment Details Patient Name: Date of Service: [name], [name] 03/25/2021 9:00 A M Medical Record Number: [id number] Patient Account Number: [id number] Date of Birth/Sex: Treating RN: 1985-12-26 (35 y.o. [name] Primary Care Syria Kestner: [name] Other Clinician: Referring Keatyn Jawad: Treating Everette Mall/Extender: [name] in Treatment: 74 Wound Status Wound Number: 3 Primary Etiology: Diabetic Wound/Ulcer of the Lower Extremity Wound Location: Left, Lateral Foot Wound Status: Open Wounding Event: Trauma Date Acquired: 10/02/2019 Weeks Of Treatment: 74 Clustered Wound: No Wound Measurements Length: (cm)  1.6 Width: (cm) 1.4 Depth: (cm) 0.3 Area: (cm) 1.759 Volume: (cm) 0.528 % Reduction in Area: -6.7% % Reduction in Volume: -220% Wound Description Classification: Grade 2 Exudate Amount: Medium Exudate Type: Serosanguineous Exudate Color: red, brown Treatment Notes Wound #3 (Foot) Wound Laterality: Left, Lateral Cleanser Soap and Water Discharge Instruction: May shower and wash wound with dial antibacterial soap and water prior to dressing change. Wound Cleanser Discharge Instruction: Cleanse the wound with wound cleanser prior to applying a clean dressing using gauze sponges, not tissue or cotton balls. Peri-Wound Care Zinc Oxide Ointment 30g tube Discharge Instruction: Apply Zinc Oxide to periwound with each dressing change  as needed. Topical keystone antibiotic compound Discharge Instruction: thin layer to wound bed Primary Dressing KerraCel Ag Gelling Fiber Dressing, 4x5 in (silver alginate) Discharge Instruction: Apply silver alginate to wound bed as instructed Secondary Dressing ABD Pad, 8x10 Discharge Instruction: Apply over primary dressing as directed. Zetuvit Plus 4x8 in Discharge Instruction: Apply over primary dressing as directed. Secured With Compression Wrap Kerlix Roll 4.5x3.1 (in/yd) Discharge Instruction: Apply Kerlix and Coban compression as directed. Coban Self-Adherent Wrap 4x5 (in/yd) Discharge Instruction: Apply over Kerlix as directed. Compression Stockings Add-Ons Electronic Signature(s) Signed: 03/25/2021 5:08:09 PM By: Shawn Stall RN, BSN Entered By: Shawn Stall on 03/25/2021 13:15:52 -------------------------------------------------------------------------------- Vitals Details Patient Name: Date of Service: [name], [name] 03/25/2021 9:00 A M Medical Record Number: [id number] Patient Account Number: [id number] Date of Birth/Sex: Treating RN: 08/25/1985 (35 y.o. Charlean Merl, [name] Primary Care Kortney Potvin: [name] Other  Clinician: Referring Carys Malina: Treating Shana Zavaleta/Extender: Gwyneth Revels in Treatment: 74 Vital Signs Time Taken: 09:11 Temperature (F): 98.7 Height (in): 77 Pulse (bpm): 74 Weight (lbs): 280 Respiratory Rate (breaths/min): 17 Body Mass Index (BMI): 33.2 Blood Pressure (mmHg): 149/74 Capillary Blood Glucose (mg/dl): 423 Reference Range: 80 - 120 mg / dl Electronic Signature(s) Signed: 03/30/2021 1:16:02 PM By: Fonnie Mu RN Entered By: Fonnie Mu on 03/25/2021 09:12:04

## 2021-03-30 NOTE — Discharge Instructions (Signed)
I have sent naproxen and lidocaine patches to your pharmacy. Please return for any worsening of your symptoms or follow-up with your primary care provider.

## 2021-03-31 ENCOUNTER — Encounter (HOSPITAL_BASED_OUTPATIENT_CLINIC_OR_DEPARTMENT_OTHER): Payer: BC Managed Care – PPO | Admitting: Internal Medicine

## 2021-03-31 DIAGNOSIS — E11621 Type 2 diabetes mellitus with foot ulcer: Secondary | ICD-10-CM | POA: Diagnosis not present

## 2021-03-31 NOTE — Progress Notes (Signed)
[name], [name] ([id number]) Visit Report for 03/31/2021 Arrival Information Details Patient Name: Date of Service: [name] 03/31/2021 12:45 PM Medical Record Number: [id number] Patient Account Number: [id number] Date of Birth/Sex: Treating RN: September 19, 1985 (35 y.o. [name] Primary Care Letonya Mangels: [name] Other Clinician: Referring Lanell Carpenter: Treating Davyn Morandi/Extender: [name] in Treatment: 16 Visit Information History Since Last Visit Added or deleted any medications: No Patient Arrived: Ambulatory Any new allergies or adverse reactions: No Arrival Time: 12:43 Had a fall or experienced change in No Accompanied By: girlfriend activities of daily living that may affect Transfer Assistance: None risk of falls: Patient Identification Verified: Yes Signs or symptoms of abuse/neglect since last visito No Secondary Verification Process Completed: Yes Hospitalized since last visit: No Patient Requires Transmission-Based Precautions: No Implantable device outside of the clinic excluding No Patient Has Alerts: No cellular tissue based products placed in the center since last visit: Has Dressing in Place as Prescribed: Yes Has Compression in Place as Prescribed: Yes Has Footwear/Offloading in Place as Prescribed: Yes Left: Other:soft cast Pain Present Now: No Electronic Signature(s) Signed: 03/31/2021 4:57:25 PM By: Baruch Gouty RN, BSN Entered By: Baruch Gouty on 03/31/2021 12:47:52 -------------------------------------------------------------------------------- Encounter Discharge Information Details Patient Name: Date of Service: [name], [name] 03/31/2021 12:45 PM Medical Record Number: [id number] Patient Account Number: [id number] Date of Birth/Sex: Treating RN: 06-24-1985 (35 y.o. [name] Primary Care Modest Draeger: [name] Other Clinician: Referring Arpita Fentress: Treating Amir Fick/Extender: [name] in Treatment: 31 Encounter Discharge Information Items Post Procedure Vitals Discharge Condition: Stable Temperature (F): 98.6 Ambulatory Status: Ambulatory Pulse (bpm): 91 Discharge Destination: Home Respiratory Rate (breaths/min): 18 Transportation: Private Auto Blood Pressure (mmHg): 152/99 Accompanied By: girlgfriend Schedule Follow-up Appointment: No Clinical Summary of Care: Electronic Signature(s) Signed: 03/31/2021 4:57:25 PM By: Baruch Gouty RN, BSN Entered By: Baruch Gouty on 03/31/2021 13:43:54 -------------------------------------------------------------------------------- Lower Extremity Assessment Details Patient Name: Date of Service: [name], [name] 03/31/2021 12:45 PM Medical Record Number: [id number] Patient Account Number: [id number] Date of Birth/Sex: Treating RN: 1985/07/05 (35 y.o. [name] Primary Care Lynnita Somma: [name] Other Clinician: Referring Estell Dillinger: Treating Jeston Junkins/Extender: [name] in Treatment: 74 Edema Assessment Assessed: [name]: No] [name]: No] Edema: [Left: Ye] [Right: s] Calf Left: Right: Point of Measurement: 48 cm From Medial Instep 51 cm Ankle Left: Right: Point of Measurement: 11 cm From Medial Instep 31 cm Vascular Assessment Pulses: Dorsalis Pedis Palpable: [Left:Yes] Electronic Signature(s) Signed: 03/31/2021 4:57:25 PM By: Baruch Gouty RN, BSN Entered By: Baruch Gouty on 03/31/2021 12:55:32 -------------------------------------------------------------------------------- Multi Wound Chart Details Patient Name: Date of Service: [name], [name] 03/31/2021 12:45 PM Medical Record Number: [id number] Patient Account Number: [id number] Date of Birth/Sex: Treating RN: 20-Mar-1986 (35 y.o. [name] Primary Care Celester Morgan: [name] Other Clinician: Referring Jhordan Kinter: Treating Sapphira Harjo/Extender: [name] in Treatment: 74 Vital Signs Height(in): 77 Pulse(bpm): 91 Weight(lbs): 280 Blood Pressure(mmHg): 152/99 Body Mass Index(BMI): 33 Temperature(F): 98.6 Respiratory Rate(breaths/min): 18 Photos: [3:Left, Lateral Foot] [N/A:N/A N/A] Wound Location: [3:Trauma] [N/A:N/A] Wounding Event: [3:Diabetic Wound/Ulcer of the Lower] [N/A:N/A] Primary Etiology: [3:Extremity Type II Diabetes] [N/A:N/A] Comorbid History: [3:10/02/2019] [N/A:N/A] Date Acquired: [3:74] [N/A:N/A] Weeks of Treatment: [3:Open] [N/A:N/A] Wound Status: [3:1.4x1.3x0.3] [N/A:N/A] Measurements L x W x D (cm) [3:1.429] [N/A:N/A] A (cm) : rea [3:0.429] [N/A:N/A] Volume (cm) : [3:13.30%] [N/A:N/A] % Reduction in A [3:rea: -160.00%] [N/A:N/A] % Reduction in Volume: [3:4] Starting Position 1 (o'clock): [  3:8] Ending Position 1 (o'clock): [3:0.3] Maximum Distance 1 (cm): [3:Yes] [N/A:N/A] Undermining: [3:Grade 2] [N/A:N/A] Classification: [3:Medium] [N/A:N/A] Exudate A mount: [3:Serosanguineous] [N/A:N/A] Exudate Type: [3:red, brown] [N/A:N/A] Exudate Color: [3:Thickened] [N/A:N/A] Wound Margin: [3:Large (67-100%)] [N/A:N/A] Granulation A mount: [3:Red] [N/A:N/A] Granulation Quality: [3:None Present (0%)] [N/A:N/A] Necrotic A mount: [3:Fat Layer (Subcutaneous Tissue): Yes N/A] Exposed Structures: [3:Fascia: No Tendon: No Muscle: No Joint: No Bone: No Small (1-33%)] [N/A:N/A] Epithelialization: [3:Debridement - Excisional] [N/A:N/A] Debridement: Pre-procedure Verification/Time Out 13:15 [N/A:N/A] Taken: [3:Lidocaine 4% Topical Solution] [N/A:N/A] Pain Control: [3:Callus, Subcutaneous] [N/A:N/A] Tissue Debrided: [3:Skin/Subcutaneous Tissue] [N/A:N/A] Level: [3:2.55] [N/A:N/A] Debridement A (sq cm): [3:rea Curette] [N/A:N/A] Instrument: [3:Minimum] [N/A:N/A] Bleeding: [3:Pressure] [N/A:N/A] Hemostasis A chieved: [3:0] [N/A:N/A] Procedural Pain: [3:0] [N/A:N/A] Post Procedural Pain: [3:Procedure was  tolerated well] [N/A:N/A] Debridement Treatment Response: [3:1.5x1.7x0.3] [N/A:N/A] Post Debridement Measurements L x W x D (cm) [3:0.601] [N/A:N/A] Post Debridement Volume: (cm) [3:Debridement] [N/A:N/A] Procedures Performed: [3:T Contact Cast otal] Treatment Notes Electronic Signature(s) Signed: 03/31/2021 3:46:55 PM By: [name] MD Signed: 03/31/2021 4:57:25 PM By: Baruch Gouty RN, BSN Entered By: [name] on 03/31/2021 13:28:31 -------------------------------------------------------------------------------- Multi-Disciplinary Care Plan Details Patient Name: Date of Service: [name], [name] 03/31/2021 12:45 PM Medical Record Number: [id number] Patient Account Number: [id number] Date of Birth/Sex: Treating RN: 08-Jun-1985 (35 y.o. [name] Primary Care Dowell Hoon: [name] Other Clinician: Referring Gokul Waybright: Treating Talea Manges/Extender: [name] in Treatment: 77 Multidisciplinary Care Plan reviewed with physician Active Inactive Nutrition Nursing Diagnoses: Imbalanced nutrition Potential for alteratiion in Nutrition/Potential for imbalanced nutrition Goals: Patient/caregiver agrees to and verbalizes understanding of need to use nutritional supplements and/or vitamins as prescribed Date Initiated: 10/24/2019 Date Inactivated: 04/06/2020 Target Resolution Date: 04/03/2020 Goal Status: Met Patient/caregiver will maintain therapeutic glucose control Date Initiated: 10/24/2019 Target Resolution Date: 04/02/2021 Goal Status: Active Interventions: Assess HgA1c results as ordered upon admission and as needed Assess patient nutrition upon admission and as needed per policy Provide education on elevated blood sugars and impact on wound healing Provide education on nutrition Treatment Activities: Education provided on Nutrition : 12/02/2020 Notes: 11/17/20: Glucose control ongoing issue, target date extended. 01/26/21:  Glucose management continues. Wound/Skin Impairment Nursing Diagnoses: Impaired tissue integrity Knowledge deficit related to ulceration/compromised skin integrity Goals: Patient/caregiver will verbalize understanding of skin care regimen Date Initiated: 10/24/2019 Target Resolution Date: 04/02/2021 Goal Status: Active Ulcer/skin breakdown will have a volume reduction of 30% by week 4 Date Initiated: 10/24/2019 Date Inactivated: 01/16/2020 Target Resolution Date: 01/10/2020 Unmet Reason: no change in Goal Status: Unmet measurements. Interventions: Assess patient/caregiver ability to obtain necessary supplies Assess patient/caregiver ability to perform ulcer/skin care regimen upon admission and as needed Assess ulceration(s) every visit Provide education on ulcer and skin care Notes: 11/17/20: Wound care regimen continues Electronic Signature(s) Signed: 03/31/2021 4:57:25 PM By: Baruch Gouty RN, BSN Entered By: Baruch Gouty on 03/31/2021 13:03:18 -------------------------------------------------------------------------------- Pain Assessment Details Patient Name: Date of Service: [name], [name] 03/31/2021 12:45 PM Medical Record Number: [id number] Patient Account Number: [id number] Date of Birth/Sex: Treating RN: 1985-11-07 (35 y.o. [name] Primary Care Tyrion Glaude: [name] Other Clinician: Referring Beretta Ginsberg: Treating Quentavious Rittenhouse/Extender: [name] in Treatment: 74 Active Problems Location of Pain Severity and Description of Pain Patient Has Paino No Site Locations Site Locations Rate the pain. Current Pain Level: 0 Pain Management and Medication Current Pain Management: Electronic Signature(s) Signed: 03/31/2021 4:57:25 PM By: Baruch Gouty RN, BSN Entered By: Baruch Gouty on 03/31/2021 12:50:21 -------------------------------------------------------------------------------- Patient/Caregiver Education  Details Patient Name: Date of Service: [name] 12/28/2022andnbsp12:45 PM Medical Record Number: [id number] Patient Account Number: [id number] Date of Birth/Gender: Treating RN: [birth year] (35 y.o. [name] Primary Care Physician: [name] Other Clinician: Referring Physician: Treating Physician/Extender: [name] in Treatment: 2 Education Assessment Education Provided To: Patient Education Topics Provided Offloading: Methods: Explain/Verbal Responses: Reinforcements needed, State content correctly Wound/Skin Impairment: Methods: Explain/Verbal Responses: Reinforcements needed, State content correctly Electronic Signature(s) Signed: 03/31/2021 4:57:25 PM By: Baruch Gouty RN, BSN Entered By: Baruch Gouty on 03/31/2021 13:03:50 -------------------------------------------------------------------------------- Wound Assessment Details Patient Name: Date of Service: [name], [name] 03/31/2021 12:45 PM Medical Record Number: [id number] Patient Account Number: [id number] Date of Birth/Sex: Treating RN: 1985/11/19 (35 y.o. [name] Primary Care Tanner Vigna: [name] Other Clinician: Referring Dayjah Selman: Treating Ramiel Forti/Extender: [name] in Treatment: 74 Wound Status Wound Number: 3 Primary Etiology: Diabetic Wound/Ulcer of the Lower Extremity Wound Location: Left, Lateral Foot Wound Status: Open Wounding Event: Trauma Comorbid History: Type II Diabetes Date Acquired: 10/02/2019 Weeks Of Treatment: 74 Clustered Wound: No Photos Wound Measurements Length: (cm) 1.4 Width: (cm) 1.3 Depth: (cm) 0.3 Area: (cm) 1.429 Volume: (cm) 0.429 % Reduction in Area: 13.3% % Reduction in Volume: -160% Epithelialization: Small (1-33%) Tunneling: No Undermining: Yes Starting Position (o'clock): 4 Ending Position (o'clock): 8 Maximum Distance: (cm) 0.3 Wound  Description Classification: Grade 2 Wound Margin: Thickened Exudate Amount: Medium Exudate Type: Serosanguineous Exudate Color: red, brown Foul Odor After Cleansing: No Slough/Fibrino No Wound Bed Granulation Amount: Large (67-100%) Exposed Structure Granulation Quality: Red Fascia Exposed: No Necrotic Amount: None Present (0%) Fat Layer (Subcutaneous Tissue) Exposed: Yes Tendon Exposed: No Muscle Exposed: No Joint Exposed: No Bone Exposed: No Treatment Notes Wound #3 (Foot) Wound Laterality: Left, Lateral Cleanser Soap and Water Discharge Instruction: May shower and wash wound with dial antibacterial soap and water prior to dressing change. Wound Cleanser Discharge Instruction: Cleanse the wound with wound cleanser prior to applying a clean dressing using gauze sponges, not tissue or cotton balls. Peri-Wound Care Zinc Oxide Ointment 30g tube Discharge Instruction: Apply Zinc Oxide to periwound with each dressing change as needed. Topical keystone antibiotic compound Discharge Instruction: thin layer to wound bed Primary Dressing KerraCel Ag Gelling Fiber Dressing, 4x5 in (silver alginate) Discharge Instruction: Apply silver alginate to wound bed as instructed Secondary Dressing Zetuvit Plus 4x8 in Discharge Instruction: Apply over primary dressing as directed. Secured With Compression Wrap Kerlix Roll 4.5x3.1 (in/yd) Discharge Instruction: Apply Kerlix and Coban compression as directed. Compression Stockings Add-Ons Environmental education officer) Signed: 03/31/2021 4:57:25 PM By: Baruch Gouty RN, BSN Entered By: Baruch Gouty on 03/31/2021 13:02:36 -------------------------------------------------------------------------------- Vitals Details Patient Name: Date of Service: [name], [name] 03/31/2021 12:45 PM Medical Record Number: [id number] Patient Account Number: [id number] Date of Birth/Sex: Treating RN: April 03, 1986 (35 y.o. [name] Primary  Care Aaronmichael Brumbaugh: [name] Other Clinician: Referring Ashtyn Freilich: Treating Annette Bertelson/Extender: [name] in Treatment: 74 Vital Signs Time Taken: 12:48 Temperature (F): 98.6 Height (in): 77 Pulse (bpm): 91 Source: Stated Respiratory Rate (breaths/min): 18 Weight (lbs): 280 Blood Pressure (mmHg): 152/99 Source: Stated Reference Range: 80 - 120 mg / dl Body Mass Index (BMI): 33.2 Notes pt did not check blood sugar this morning Electronic Signature(s) Signed: 03/31/2021 4:57:25 PM By: Baruch Gouty RN, BSN Entered By: Baruch Gouty on 03/31/2021 12:49:59

## 2021-03-31 NOTE — Progress Notes (Signed)
[place], [name] ([id number]) Visit Report for 03/31/2021 Debridement Details Patient Name: Date of Service: [name] 03/31/2021 12:45 PM Medical Record Number: [id number] Patient Account Number: [id number] Date of Birth/Sex: Treating RN: 1986-04-03 (35 y.o. [name] Primary Care Provider: [name] Other Clinician: Referring Provider: Treating Provider/Extender: [name] in Treatment: 74 Debridement Performed for Assessment: Wound #3 Left,Lateral Foot Performed By: Physician [name], MD Debridement Type: Debridement Severity of Tissue Pre Debridement: Fat layer exposed Level of Consciousness (Pre-procedure): Awake and Alert Pre-procedure Verification/Time Out Yes - 13:15 Taken: Start Time: 13:17 Pain Control: Lidocaine 4% T opical Solution T Area Debrided (L x W): otal 1.5 (cm) x 1.7 (cm) = 2.55 (cm) Tissue and other material debrided: Viable, Non-Viable, Callus, Subcutaneous, Skin: Epidermis Level: Skin/Subcutaneous Tissue Debridement Description: Excisional Instrument: Curette Bleeding: Minimum Hemostasis Achieved: Pressure Procedural Pain: 0 Post Procedural Pain: 0 Response to Treatment: Procedure was tolerated well Level of Consciousness (Post- Awake and Alert procedure): Post Debridement Measurements of Total Wound Length: (cm) 1.5 Width: (cm) 1.7 Depth: (cm) 0.3 Volume: (cm) 0.601 Character of Wound/Ulcer Post Debridement: Improved Severity of Tissue Post Debridement: Fat layer exposed Post Procedure Diagnosis Same as Pre-procedure Electronic Signature(s) Signed: 03/31/2021 3:46:55 PM By: [name] MD Signed: 03/31/2021 4:57:25 PM By: Baruch Gouty RN, BSN Entered By: [name] on 03/31/2021 13:28:50 -------------------------------------------------------------------------------- HPI Details Patient Name: Date of Service: [name], [name] 03/31/2021 12:45 PM Medical Record Number:  [id number] Patient Account Number: [id number] Date of Birth/Sex: Treating RN: 07-08-1985 (35 y.o. 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 He comes in for review of a wound over the plantar fifth metatarsal head extending into the lateral part of the foot. He was followed for this previously by his podiatrist Dr. Cornelius Moras. As the patient tells his story he went to see podiatry first for a swelling he developed on the lateral part of his fifth metatarsal head in May. He states this was "open" by podiatry and the area closed. He was followed up in June and it was again opened callus removed and it closed promptly. There were plans being made for surgery on the fifth metatarsal head in June however his blood sugar was apparently too high for anesthesia. Apparently the area was debrided and opened again in June and it is never closed since. Looking over the records from podiatry I am really not able to follow this. It was clear when he was first seen it was before 5/14 at that point he already had a wound. By 5/17 the ulcer was resolved. I do not see anything about a procedure. On 5/28 noted to have pre-ulcerative moderate keratosis. X-ray noted 1/5 contracted toe and tailor's bunion and metatarsal deformity. On a visit date on 09/28/2018 the dorsal part of the left foot it healed and resolved. There was concern about swelling in his lower extremity he was sent to the ER.. As far as I can tell he was seen in the ER on 7/12 with an ulcer on his left foot. A DVT rule out of the left leg was negative. I do not think I have complete records from podiatry but I am not able to verify the procedures this patient states he had. He  states after  the last procedure the wound has never closed although I am not able to follow this in the records I have from podiatry. He has not had a recent x-ray The patient has been using Neosporin on the wound. He is wearing a Darco shoe. He is still very active up on his foot working and exercising. Past medical history; type 2 diabetes ketosis-prone, leg swelling with a negative DVT study in July. Non-smoker ABI in our clinic was 0.85 on the left 10/16; substantial wound on the plantar left fifth met head extending laterally almost to the dorsal fifth MTP. We have been using silver alginate we gave him a Darco forefoot off loader. An x-ray did not show evidence of osteomyelitis did note soft tissue emphysema which I think was due to gas tracking through an open wound. There is no doubt in my mind he requires an MRI 10/23; MRI not booked until 3 November at the earliest this is largely due to his glucose sensor in the right arm. We have been using silver alginate. There has been an improvement 10/29; I am still not exactly sure when his MRI is booked for. He says it is the third but it is the 10th in epic. This definitely needs to be done. He is running a low-grade fever today but no other symptoms. No real improvement in the 1 02/26/2019 patient presents today for a follow-up visit here in our clinic he is last been seen in the clinic on October 29. Subsequently we were working on getting MRI to evaluate and see what exactly was going on and where we would need to go from the standpoint of whether or not he had osteomyelitis and again what treatments were going be required. Subsequently the patient ended up being admitted to the hospital on 02/07/2019 and was discharged on 02/14/2019. This is a somewhat interesting admission with a discharge diagnosis of pneumonia due to COVID-19 although he was positive for COVID-19 when tested at the urgent care but negative x2 when he was actually in the  hospital. With that being said he did have acute respiratory failure with hypoxia and it was noted he also have a left foot ulceration with osteomyelitis. With that being said he did require oxygen for his pneumonia and I level 4 L. He was placed on antivirals and steroids for the COVID-19. He was also transferred to the [facility] at one point. Nonetheless he did subsequently discharged home and since being home has done much better in that regard. The CT angiogram did not show any pulmonary embolism. With regard to the osteomyelitis the patient was placed on vancomycin and Zosyn while in the hospital but has been changed to Augmentin at discharge. It was also recommended that he follow- up with wound care and podiatry. Podiatry however wanted him to see [name] according to the patient prior to them doing anything further. His hemoglobin A1c was 9.9 as noted in the hospital. Have an MRI of the left foot performed while in the hospital on 02/04/2019. 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 There is an overlying plantar open wound noted an abscess tracking back along the lateral aspect of the fifth metatarsal shaft. There is otherwise diffuse cellulitis and mild fasciitis without findings of polymyositis. The patient did have recently pneumonia secondary to COVID-19 I looked in the chart through epic and it does appear that the patient may need to  have an additional x-ray just to ensure everything is cleared and that he has no airspace disease prior to putting him into the [name]. 03/05/2019; patient was readmitted to the clinic last week. He was hospitalized twice for a viral upper respiratory tract infection from 11/1 through 11/4 and then 11/5 through 11/12 ultimately this turned out to be Covid pneumonitis. Although he was discharged on oxygen he is not using it. He says he feels fine. He has no  exercise limitation no cough no sputum. His O2 sat in our clinic today was 100% on room air. 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 He received Vanco and Zosyn in the hospital and then was discharged on 2 weeks of Augmentin. I do not see any relevant cultures. He was supposed to follow-up with infectious disease but I do not see that he has an appointment. 12/8; patient saw Dr. Novella Olive of infectious disease last week. He felt that he had had adequate antibiotic therapy. He did not go to follow-up with Dr. Amalia Hailey of podiatry and I have again talked to him about the pros and cons of this. He does not want to consider a ray amputation of this time. He is aware of the risks of recurrence, migration etc. He started HBO today and tolerated this well. He can complete the Augmentin that I gave him last week. I have looked over the lab work that Dr. Chana Bode ordered his C-reactive protein was 3.3 and his sedimentation rate was 17. The C-reactive protein is never really been measurably that high in this patient 12/15; not much change in the wound today however he has undermining along the lateral part of the foot again more extensively than last week. He has some rims of epithelialization. We have been using silver alginate. He is undergoing hyperbarics but did not dive today 12/18; in for his obligatory first total contact cast change. Unfortunately there was pus coming from the undermining area around his fifth metatarsal head. This was cultured but will preclude reapplication of a cast. He is seen in conjunction with HBO 12/24; patient had staph lugdunensis in the wound in the undermining area laterally last time. We put him on doxycycline which should have covered this. The wound looks better today. I am going to give him another week of doxycycline before reattempting the total contact  cast 12/31; the patient is completing antibiotics. Hemorrhagic debris in the distal part of the wound with some undermining distally. He also had hyper granulation. Extensive debridement with a #5 curette. The infected area that was on the lateral part of the fifth met head is closed over. I do not think he needs any more antibiotics. Patient was seen prior to HBO. Preparations for a total contact cast were made in the cast will be placed post hyperbarics 04/11/19; once again the patient arrives today without complaint. He had been in a cast all week noted that he had heavy drainage this week. This resulted in large raised areas of macerated tissue around the wound 1/14; wound bed looks better slightly smaller. Hydrofera Blue has been changing himself. He had a heavy drainage last week which caused a lot of maceration around the wound so I took him out of a total contact cast he says the drainage is actually better this week He is seen today in conjunction with HBO 1/21; returns to clinic. He was up in  [place] for a day or 2 attending a funeral. He comes back in with the wound larger and with a large area of exposed bone. He had osteomyelitis and septic arthritis of the fifth left metatarsal head while he was in hospital. He received IV antibiotics in the hospital for a prolonged period of time then 3 weeks of Augmentin. Subsequently I gave him 2 weeks of doxycycline for more superficial wound infection. When I saw this last week the wound was smaller the surface of the wound looks satisfactory. 1/28; patient missed hyperbarics today. Bone biopsy I did last time showed Enterococcus faecalis and Staphylococcus lugdunensis . He has a wide area of exposed bone. We are going to use silver alginate as of today. I had another ethical discussion with the patient. 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 In this situation I think the likelihood of healing this is low.  Therefore I have recommended a ray amputation and with the patient's agreement I have referred him to Dr. Doran Durand. The other issue is that his compliance with hyperbarics has been minimal because of his work schedule and given his underlying decision I am going to stop this today READMISSION 10/24/2019 MRI 09/29/2019 left foot IMPRESSION: 1. Apparent skin ulceration inferior and lateral to the 5th metatarsal base with underlying heterogeneous T2 signal and enhancement in the subcutaneous fat. Small peripherally enhancing fluid collections along the plantar and lateral aspects of the 5th metatarsal base suspicious for abscesses. 2. Interval amputation through the mid 5th metatarsal with nonspecific low-level marrow edema and enhancement. Given the proximity to the adjacent soft tissue inflammatory changes, osteomyelitis cannot be excluded. 3. The additional bones appear unremarkable. MRI 09/29/2019 right foot IMPRESSION: 1. Soft tissue ulceration lateral to the 5th MTP joint. There is low-level T2 hyperintensity within the 4th and 5th metatarsal heads and adjacent proximal phalanges without abnormal T1 signal or cortical destruction. These findings are nonspecific and could be seen with early marrow edema, hyperemia or early osteomyelitis. No evidence of septic joint. 2. Mild tenosynovitis and synovial enhancement associated with the extensor digitorum tendons at the level of the midfoot. 3. Diffuse low-level muscular T2 hyperintensity and enhancement, most consistent with diabetic myopathy. LEFT FOOT BONE Methicillin resistant staphylococcus aureus Staphylococcus lugdunensis MIC MIC CIPROFLOXACIN >=8 RESISTANT Resistant <=0.5 SENSI... Sensitive CLINDAMYCIN <=0.25 SENS... Sensitive >=8 RESISTANT Resistant ERYTHROMYCIN >=8 RESISTANT Resistant >=8 RESISTANT Resistant GENTAMICIN <=0.5 SENSI... Sensitive <=0.5 SENSI... Sensitive Inducible Clindamycin NEGATIVE Sensitive NEGATIVE  Sensitive OXACILLIN >=4 RESISTANT Resistant 2 SENSITIVE Sensitive RIFAMPIN <=0.5 SENSI... Sensitive <=0.5 SENSI... Sensitive TETRACYCLINE <=1 SENSITIVE Sensitive <=1 SENSITIVE Sensitive TRIMETH/SULFA <=10 SENSIT Sensitive <=10 SENSIT Sensitive ... Marland Kitchen.. VANCOMYCIN 1 SENSITIVE Sensitive <=0.5 SENSI... Sensitive Right foot bone . Component 3 wk ago Specimen Description BONE Special Requests RIGHT 4 METATARSAL SAMPLE B Gram Stain NO WBC SEEN NO ORGANISMS SEEN Culture RARE METHICILLIN RESISTANT STAPHYLOCOCCUS AUREUS NO ANAEROBES ISOLATED Performed at [facility], [place] [address], [place], [place] [address] Report Status 10/08/2019 FINAL Organism ID, Bacteria METHICILLIN RESISTANT STAPHYLOCOCCUS AUREUS Resulting Agency [facility] Susceptibility Methicillin resistant staphylococcus aureus MIC CIPROFLOXACIN >=8 RESISTANT Resistant CLINDAMYCIN <=0.25 SENS... Sensitive ERYTHROMYCIN >=8 RESISTANT Resistant GENTAMICIN <=0.5 SENSI... Sensitive Inducible Clindamycin NEGATIVE Sensitive OXACILLIN >=4 RESISTANT Resistant RIFAMPIN <=0.5 SENSI... Sensitive TETRACYCLINE <=1 SENSITIVE Sensitive TRIMETH/SULFA <=10 SENSIT Sensitive ... VANCOMYCIN 1 SENSITIVE Sensitive This is a patient we had in clinic earlier this year with a wound over his left fifth metatarsal head. 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 Doran Durand on 05/16/2019. He seems to have developed problems with wounds on his bilateral feet in June 2021 although he may have had problems earlier than this. He was in an urgent care with a right foot ulcer on 09/26/2019 and given a course of doxycycline. This was apparently after having trouble getting into see  orthopedics. He was seen by podiatry on 09/28/2019 noted to have bilateral lower extremity ulcers including the left lateral fifth metatarsal base and the right subfifth met head. It was noted that had purulent drainage at that time. He required hospitalization from 6/20 through 7/2. This was because of worsening right foot wounds. He underwent bilateral operative incision and drainage and bone biopsies bilaterally. Culture results are listed above. He has been referred back to clinic by Dr. Jacqualyn Posey of podiatry. He is also followed by Dr. Megan Salon who saw him yesterday. He was discharged from hospital on Zyvox Flagyl and Levaquin and yesterday changed to doxycycline Flagyl and Levaquin. His inflammatory markers on 6/26 showed a sedimentation rate of 129 and a C-reactive protein of 5. This is improved to 14 and 1.3 respectively. This would indicate improvement. ABIs in our clinic today were 1.23 on the right and 1.20 on the left 11/01/2019 on evaluation today patient appears to be doing fairly well in regard to the wounds on his feet at this point. Fortunately there is no signs of active infection at this time. No fevers, chills, nausea, vomiting, or diarrhea. He currently is seeing infectious disease and still under their care at this point. Subsequently he also has both wounds which she has not been using collagen on as he did not receive that in his packaging he did not call us and let us know that. Apparently that just was missed on the order. Nonetheless we will get that straightened out today. 8/9-Patient returns for bilateral foot wounds, using Prisma with hydrogel moistened dressings, and the wounds appear stable. Patient using surgical shoes, avoiding much pressure or weightbearing as much as possible 8/16; patient has bilateral foot wounds. 1 on the right lateral foot proximally the other is on the left mid lateral foot. Both required debridement of callus and thick skin around the wounds. We  have been using silver collagen 8/27; patient has bilateral lateral foot wounds. The area on the left substantially surrounded by callus and dry skin. This was removed from the wound edge. The underlying wound is small. The area on the right measured somewhat smaller today. 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 Unfortunately I did not update his antibiotics during today's visit. 9/10 I reviewed Dr. Hale Bogus last notes he felt he had completed antibiotics his inflammatory markers were reasonably well controlled. He has a small wound on the lateral left foot and a tiny area on the right which is just above closed. He is using Hydrofera Blue with border foam he has bilateral surgical shoes 9/24; 2 week f/u. doing well. right foot is closed. left foot still undermined. 10/14; right foot remains closed at the fifth met head. The area over the base of the left fifth metatarsal has a small open area but considerable undermining towards the plantar foot. Thick callus skin around  this suggests an adequate pressure relief. We have talked about this. He says he is going to go back into his cam boot. I suggested a total contact cast he did not seem enamored with this suggestion 10/26; left foot base of the fifth metatarsal. Same condition as last time. He has skin over the area with an open wound however the skin is not adherent. He went to see Dr. Earleen Newport who did an x-ray and culture of his foot I have not reviewed the x-ray but the patient was not told anything. He is on doxycycline 11/11; since the patient was last here he was in the emergency room on 10/30 he was concerned about swelling in the left foot. They did not do any cultures or x-rays. They changed his antibiotics to cephalexin. Previous culture showed group B strep. The cephalexin is appropriate as doxycycline has less than predictable coverage. Arrives in clinic today with  swelling over this area under the wound. He also has a new wound on the right fifth metatarsal head 11/18; the patient has a difficult wound on the lateral aspect of the left fifth metatarsal head. The wound was almost ballotable last week I opened it slightly expecting to see purulence however there was just bleeding. I cultured this this was negative. X-ray unchanged. We are trying to get an MRI but I am not sure were going to be able to get this through his insurance. He also has an area on the right lateral fifth metatarsal head this looks healthier 12/3; the patient finally got our MRI. Surprisingly this did not show osteomyelitis. I did show the soft tissue ulceration at the lateral plantar aspect of the fifth metatarsal base with a tiny residual 6 mm abscess overlying the superficial fascia I have tried to culture this area I have not been able to get this to grow anything. Nevertheless the protruding tissue looks aggravated. I suspect we should try to treat the underlying "abscess with broad-spectrum antibiotics. I am going to start him on Levaquin and Flagyl. He has much less edema in his legs and I am going to continue to wrap his legs and see him weekly 12/10. I started Levaquin and Flagyl on him last week. He just picked up the Flagyl apparently there was some delay. The worry is the wound on the left fifth metatarsal base which is substantial and worsening. His foot looks like he inverts at the ankle making this a weightbearing surface. Certainly no improvement in fact I think the measurements of this are somewhat worse. We have been using 12/17; he apparently just got the Levaquin yesterday this is 2 weeks after the fact. He has completed the Flagyl. The area over the left fifth metatarsal base still has protruding granulation tissue although it does not look quite as bad as it did some weeks ago. He has severe bilateral lymphedema although we have not been treating him for wounds on his  legs this is definitely going to require compression. There was so much edema in the left I did not wish to put him in a total contact cast today. I am going to increase his compression from 3-4 layer. The area on the right lateral fifth met head actually look quite good and superficial. 12/23; patient arrived with callus on the right fifth met head and the substantial hyper granulated callused wound on the base of his fifth metatarsal. He says he is completing his Levaquin in 2 days but I do not think that adds  up with what I gave him but I will have to double check this. We are using Hydrofera Blue on both areas. My plan is to put the left leg in a cast the week after New Year's 04/06/2020; patient's wounds about the same. Right lateral fifth metatarsal head and left lateral foot over the base of the fifth metatarsal. There is undermining on the left lateral foot which I removed before application of total contact cast continuing with Hydrofera Blue new. Patient tells me he was seen by endocrinology today lab work was done [Dr. Kerr]. Also wondering whether he was referred to cardiology. I went over some lab work from previously does not have chronic renal failure certainly not nephrotic range proteinuria he does have very poorly controlled diabetes but this is not his most updated lab work. Hemoglobin A1c has been over 11 1/10; the patient had a considerable amount of leakage towards mid part of his left foot with macerated skin however the wound surface looks better the area on the right lateral fifth met head is better as well. I am going to change the dressing on the left foot under the total contact cast to silver alginate, continue with Hydrofera Blue on the right. 1/20; patient was in the total contact cast for 10 days. Considerable amount of drainage although the skin around the wound does not look too bad on the left foot. The area on the right fifth metatarsal head is closed. Our nursing  staff reports large amount of drainage out of the left lateral foot wound 1/25; continues with copious amounts of drainage described by our intake staff. PCR culture I did last week showed E. coli and Enterococcus faecalis and low quantities. Multiple resistance genes documented including extended spectrum beta lactamase, MRSA, MRSE, quinolone, tetracycline. The wound is not quite as good this week as it was 5 days ago but about the same size 2/3; continues with copious amounts of malodorous drainage per our intake nurse. The PCR culture I did 2 weeks ago showed E. coli and low quantities of Enterococcus. There were multiple resistance genes detected. I put Neosporin on him last week although this does not seem to have helped. The wound is slightly deeper today. Offloading continues to be an issue here although with the amount of drainage she has a total contact cast is just not going to work 2/10; moderate amount of drainage. Patient reports he cannot get his stocking on over the dressing. I told him we have to do that the nurse gave him suggestions on how to make this work. The wound is on the bottom and lateral part of his left foot. Is cultured predominantly grew low amounts of Enterococcus, E. coli and anaerobes. There were multiple resistance genes detected including extended spectrum beta lactamase, quinolone, tetracycline. I could not think of an easy oral combination to address this so for now I am going to do topical antibiotics provided by [facility] I think the main agents here are vancomycin and an aminoglycoside. We have to be able to give him access to the wounds to get the topical antibiotic on 2/17; moderate amount of drainage this is unchanged. He has his Keystone topical antibiotic against the deep tissue culture organisms. He has been using this and changing the dressing daily. Silver alginate on the wound surface. 2/24; using Keystone antibiotic with silver alginate on the top. He  had too much drainage for a total contact cast at one point although I think that is improving and  I think in the next week or 2 it might be possible to replace a total contact cast I did not do this today. In general the wound surface looks healthy however he continues to have thick rims of skin and subcutaneous tissue around the wide area of the circumference which I debrided 06/04/2020 upon evaluation today patient appears to be doing well in regard to his wound. I do feel like he is showing signs of improvement. There is little bit of callus and dead tissue around the edges of the wound as well as what appears to be a little bit of a sinus tract that is off to the side laterally I would perform debridement to clear that away today. 3/17; left lateral foot. The wound looks about the same as I remember. Not much depth surface looks healthy. No evidence of infection 3/25; left lateral foot. Wound surface looks about the same. Separating epithelium from the circumference. There really is no evidence of infection here however not making progress by my view 3/29; left lateral foot. Surface of the wound again looks reasonably healthy still thick skin and subcutaneous tissue around the wound margins. There is no evidence of infection. One of the concerns being brought up by the nurses has again the amount of drainage vis--vis continued use of a total contact cast 4/5; left lateral foot at roughly the base of the fifth metatarsal. Nice healthy looking granulated tissue with rims of epithelialization. The overall wound measurements are not any better but the tissue looks healthy. The only concern is the amount of drainage although he has no surrounding maceration with what we have been doing recently to absorb fluid and protect his skin. He also has lymphedema. 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 He states that the kerlix/coban under the TCC rolled down and could not get it back up. He has been using an offloading scooter and has somehow hurt his right foot using this device. This happened last week. He states that the side of his right foot developed a blister and opened. The top of his foot also has a few small open wounds he thinks is due to his socks rubbing in his shoes. He has not been using any dressings to the wound. He denies purulent drainage, fever/chills or erythema to the wounds. 4/22; patient presents for 1 week follow-up. He developed new wounds to the right foot that were evaluated at last clinic visit. He continues to have a total contact cast to the left leg and he reports no issues. He has been using silver collagen to the right foot wounds with no issues. He denies purulent drainage, fever/chills or erythema to the right foot wounds. He has no complaints today 4/25; patient presents for 1 week follow-up. He has a total contact cast of the left leg and reports no issues. He has been using silver alginate to the right foot wound. He denies purulent drainage, fever/chills or erythema to the right foot wounds. 5/2 patient presents for 1 week follow-up. T contact cast on the left. The wound which is on the base of the plantar foot at the base of the fifth metatarsal otal actually looks quite good and dimensions continue to gradually contract. HOWEVER  the area on the right lateral fifth metatarsal head is much larger than what I remember from 2 weeks ago. Once more is he has significant levels of hypergranulation. Noteworthy that he had this same hyper granulated response on his wound on the left foot at one point in time. So much so that he I thought there was an underlying fluid collection. Based on this I think this just needs  debridement. 5/9; the wound on the left actually continues to be gradually smaller with a healthy surface. Slight amount of drainage and maceration of the skin around but not too bad. However he has a large wound over the right fifth metatarsal head very much in the same configuration as his left foot wound was initially. I used silver nitrate to address the hyper granulated tissue no mechanical debridement 5/16; area on the left foot did not look as healthy this week deeper thick surrounding macerated skin and subcutaneous tissue. The area on the right foot fifth met head was about the same The area on the right ankle that we identified last week is completely broken down into an open wound presumably a stocking rubbing issue 5/23; patient has been using a total contact cast to the left side. He has been using silver alginate underneath. He has also been using silver alginate to the right foot wounds. He has no complaints today. He denies any signs of infection. 5/31; the left-sided wound looks some better measure smaller surface granulation looks better. We have been using silver alginate under the total contact cast The large area on his right fifth met head and right dorsal foot look about the same still using silver alginate 6/6; neither side is good as I was hoping although the surface area dimensions are better. A lot of maceration on his left and right foot around the wound edge. Area on the dorsal right foot looks better. He says he was traveling. I am not sure what does the amount of maceration around the plantar wounds may be drainage issues 6/13; in general the wound surfaces look quite good on both sides. Macerated skin and raised edges around the wound required debridement although in general especially on the left the surface area seems improved. The area on the right dorsal ankle is about the same I thought this would not be such a problem to close 6/20; not much change in either  wound although the one on the right looks a little better. Both wounds have thick macerated edges to the skin requiring debridements. We have been using silver alginate. The area on the dorsal right ankle is still open I thought this would be closed. 6/28; patient comes in today with a marked deterioration in the right foot wound fifth met head. Wide area of exposed bone this is a drastic change from last time. The area on the left there we have been casting is stagnant. We have been using silver alginate in both wound areas. 7/5; bone culture I did for PCR last time was positive for Pseudomonas, group B strep, Enterococcus and Staph aureus. There was no suggestion of methicillin resistance or ampicillin resistant genes. This was resistant to tetracycline however He comes into the clinic today with the area over his right plantar fifth metatarsal head which had been doing so well 2 weeks ago completely necrotic feeling bone. I do not know that this is going to be salvageable. The left foot wound is certainly no smaller but it has a better surface and is  superficial. 7/8; patient called in this morning to say that his total contact cast was rubbing against his foot. He states he is doing fine overall. He denies signs of infection. 7/12; continued deterioration in the wound over the right fifth metatarsal head crumbling bone. This is not going to be salvageable. The patient agrees and wants to be referred to Dr. Doran Durand which we will attempt to arrange as soon as possible. I am going to continue him on antibiotics as long as that takes so I will renew those today. The area on the left foot which is the base of the fifth metatarsal continues to look somewhat better. Healthy looking tissue no depth no debridement is necessary here. 7/20; the patient was kindly seen by Dr. Doran Durand of orthopedics on 10/19/2020. He agreed that he needed a ray amputation on the right and he said he would have a look at the  fourth as well while he was intraoperative. Towards this end we have taken him out of the total contact cast on the left we will put him in a wrap with Hydrofera Blue. As I understand things surgery is planned for 7/21 7/27; patient had his surgery last Thursday. 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 Apparently everything went well we did not still disturb that today The area on the left foot actually looks quite good. He has been much less mobile which probably explains this he did not seem to do well in the total contact cast secondary to drainage and maceration I think. 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 Fortunately there is no evidence of active infection at this time. No fevers, chills, nausea, vomiting, or diarrhea. Overall I think that he is actually doing extremely well. Nonetheless I do believe that he is staying off of this more following the surgery in his right foot that is the reason the left is doing so great. 8/16; left plantar foot wound. This looks smaller than the last time I saw this he is using Hydrofera Blue. The surgical wound on the right foot is being followed by Dr. Doran Durand we did not look at this today. He has surgical shoes on both feet 8/23; left plantar foot wound not as good this week. Surrounding macerated skin and subcutaneous tissue everything looks moist and wet. I do not think he is offloading this adequately. He is using a surgical shoe Apparently the right foot surgical wound is not open although I did not check his foot 8/31; left plantar foot lateral aspect. Much improved this week. He has no maceration. Some improvement in the surface area of the wound but most impressively the depth is come in we are using silver alginate. The patient is a Product/process development scientist. He is asked that we write him a letter so he can go back to work. I have also  tried to see if we can write something that will allow him to limit the amount of time that he is on his foot at work. 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 Hopefully the latter can be avoided 9/6; I believe the patient missed an appointment last week. He arrives in today with a wound looking roughly the same certainly no better. Undermining laterally and also inferiorly. We used molecuLight today in training with the  patient's permission.. We are using silver alginate 9/21 wound is measuring bigger this week although this may have to do with the aggressive circumferential debridement last week in response to the blush fluorescence on the MolecuLight. Culture I did last week showed significant MSSA and E. coli. I put him on Augmentin but he has not started it yet. We are also going to send this for compounded antibiotics at [facility]. There is no evidence of systemic infection 9/29; silver alginate. His Keystone arrived. He is completing Augmentin in 2 days. Offloading in a cam boot. Moderate drainage per our intake staff 10/5; using silver alginate. He has been using his [place]. He has completed his Augmentin. Per our intake nurse still a lot of drainage, far too much to consider a total contact cast. Wound measures about the same. He had the same undermining area that I defined last week from a roughly 11-3. I remove this today 10/12; using silver alginate he is using the [place]. He comes in for a nurse visit hence we are applying [employer] twice a week. Measuring slightly better today and less notable drainage. Extensive debridement of the wound edge last time 10/18; using topical Keystone and silver alginate and a soft cast. Wound measurements about the same. Drainage was through his soft cast. We are changing this twice a week Tuesdays and Friday 10/25; comes in with moderate drainage. Still using Keystone  silver alginate and a soft cast. Wound dimensions completely the same.He has a lot of edema in the left leg he has lymphedema. Asking for [name] to consider wrapping him as he cannot get his stocking on over the soft cast 11/2; comes in with moderate to large drainage slightly smaller in terms of width we have been using [place]. His wound looks satisfactory but not much improvement 11/4; patient presents today for obligatory cast change. Has no issues or complaints today. He denies signs of infection. 11/9; patient traveled this weekend to [place], was on the cast quite a bit. Staining of the cast with black material from his walking boot. Drainage was not quite as bad as we feared. Using silver alginate and Keystone 11/16; we do not have size for cast therefore we have been putting a soft cast on him since the change on Friday. Still a significant amount of drainage necessitating changing twice a week. We have been using the Keystone at cast changes either hard or soft as well as silver alginate Comes in the clinic with things actually looking fairly good improvement in width. He says his offloading is about the same 02/24/2021 upon evaluation today patient actually comes back in and is doing excellent in regard to his foot ulcer this is significantly smaller even compared to the last visit. The soft cast seems to have done extremely well for him which is great news. I do not see any signs of infection minimal debridement will be needed today. 11/30; left lateral foot much improved half a centimeter improvement in surface area. No evidence of infection. He seems to be doing better with the soft cast in the TCC therefore we will continue with this. He comes back in later in the week for a change with the nurses. This is due to drainage 12/6; no improvement in dimensions. Under illumination some debris on the surface we have been using silver alginate, soft cast. If there is anything optimistic here he seems  to have have less drainage 12/13. Dimensions are improved both length and width and slightly in depth. Appears  to be quite healthy today. Raised edges of this thick skin and callus around the edges however. He is in a soft cast were bringing him back once for a change on Friday. Drainage is better 12/20. Dimensions are improved. He still has raised edges of thick skin and callus around the edges. We are using a soft cast 12/28; comes in today with thick callus around the wound. Using silver under alginate under a soft cast. I do not think there is much improvement in any measurement Electronic Signature(s) Signed: 03/31/2021 3:46:55 PM By: [name] MD Entered By: [name] on 03/31/2021 13:29:37 -------------------------------------------------------------------------------- Physical Exam Details Patient Name: Date of Service: [name], [name] 03/31/2021 12:45 PM Medical Record Number: [id number] Patient Account Number: [id number] Date of Birth/Sex: Treating RN: 1985/11/07 (35 y.o. [name] Primary Care Provider: [name] Other Clinician: Referring Provider: Treating Provider/Extender: [name] in Treatment: 69 Constitutional Patient is hypertensive.. Pulse regular and within target range for patient.Marland Kitchen Respirations regular, non-labored and within target range.. Temperature is normal and within the target range for the patient.Marland Kitchen Appears in no distress. Notes Wound exam; no change in measurement. Thick callus and skin around the wound edge. Aggressive debridement of callus skin and subcutaneous tissue around the wound margins. Electronic Signature(s) Signed: 03/31/2021 3:46:55 PM By: [name] MD Entered By: [name] on 03/31/2021 13:30:31 -------------------------------------------------------------------------------- Physician Orders Details Patient Name: Date of Service: [name], [name] 03/31/2021 12:45  PM Medical Record Number: [id number] Patient Account Number: [id number] Date of Birth/Sex: Treating RN: 1985-09-29 (35 y.o. [name] Primary Care Provider: [name] Other Clinician: Referring Provider: Treating Provider/Extender: [name] in Treatment: 30 Verbal / Phone Orders: No Diagnosis Coding ICD-10 Coding Code Description E11.621 Type 2 diabetes mellitus with foot ulcer L97.528 Non-pressure chronic ulcer of other part of left foot with other specified severity Follow-up Appointments ppointment in 1 week. - Dr. Dellia Nims Return A Bathing/ Shower/ Hygiene May shower with protection but do not get wound dressing(s) wet. Edema Control - Lymphedema / SCD / Other Bilateral Lower Extremities Elevate legs to the level of the heart or above for 30 minutes daily and/or when sitting, a frequency of: - throughout the day Avoid standing for long periods of time. Exercise regularly Moisturize legs daily. - right leg every night before bed. Compression stocking or Garment 20-30 mm/Hg pressure to: - Apply to right leg in the morning and remove at night. Off-Loading Total Contact Cast to Left Lower Extremity Other: - minimal weight bearing left foot Additional Orders / Instructions Follow Nutritious Diet Wound Treatment Wound #3 - Foot Wound Laterality: Left, Lateral Cleanser: Soap and Water 1 x Per Week/30 Days Discharge Instructions: May shower and wash wound with dial antibacterial soap and water prior to dressing change. Cleanser: Wound Cleanser (Generic) 1 x Per Week/30 Days Discharge Instructions: Cleanse the wound with wound cleanser prior to applying a clean dressing using gauze sponges, not tissue or cotton balls. Peri-Wound Care: Zinc Oxide Ointment 30g tube 1 x Per Week/30 Days Discharge Instructions: Apply Zinc Oxide to periwound with each dressing change as needed. Topical: keystone antibiotic compound 1 x Per Week/30 Days Discharge  Instructions: thin layer to wound bed Prim Dressing: KerraCel Ag Gelling Fiber Dressing, 4x5 in (silver alginate) (Generic) 1 x Per Week/30 Days ary Discharge Instructions: Apply silver alginate to wound bed as instructed Secondary Dressing: Zetuvit Plus 4x8 in 1 x Per Week/30 Days Discharge Instructions: Apply over  primary dressing as directed. Secured With: 29M Medipore H Soft Cloth Surgical T ape, 4 x 10 (in/yd) 1 x Per Week/30 Days Discharge Instructions: Secure with tape as directed. Electronic Signature(s) Signed: 03/31/2021 3:46:55 PM By: [name] MD Signed: 03/31/2021 4:57:25 PM By: Baruch Gouty RN, BSN Entered By: Baruch Gouty on 03/31/2021 13:45:35 -------------------------------------------------------------------------------- Problem List Details Patient Name: Date of Service: [name], [name] 03/31/2021 12:45 PM Medical Record Number: [id number] Patient Account Number: [id number] Date of Birth/Sex: Treating RN: [birth year] (35 y.o. [name] Primary Care Provider: [name] Other Clinician: Referring Provider: Treating Provider/Extender: [name] in Treatment: 71 Active Problems ICD-10 Encounter Code Description Active Date MDM Diagnosis E11.621 Type 2 diabetes mellitus with foot ulcer 10/24/2019 No Yes L97.528 Non-pressure chronic ulcer of other part of left foot with other specified 10/24/2019 No Yes severity Inactive Problems ICD-10 Code Description Active Date Inactive Date L97.518 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ity 10/24/2019 10/24/2019 L97.518 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ity 07/14/2020 07/14/2020 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, right ankle and foot 10/24/2019 10/24/2019 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 08/10/2020 08/10/2020 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, left ankle and foot 10/24/2019 10/24/2019 B95.62 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 10/24/2019 10/24/2019 classified elsewhere Resolved Problems Electronic Signature(s) Signed: 03/31/2021 3:46:55 PM By: [name] MD Entered By: [name] on 03/31/2021 13:28:22 -------------------------------------------------------------------------------- Progress Note Details Patient Name: Date of Service: [name], [name] 03/31/2021 12:45 PM Medical Record Number: [id number] Patient Account Number: [id number] Date of Birth/Sex: Treating RN: 04/14/1985 (35 y.o. 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 He comes in for review of a wound over the plantar fifth metatarsal head extending into the lateral part of the foot. He was followed for this previously by his podiatrist Dr. Cornelius Moras. As the patient tells his story he went to see podiatry first for a swelling he developed on the lateral part of his fifth metatarsal head in May. He states this was "open" by podiatry and the area closed. He was followed up in June and it was again opened callus removed and it closed promptly. There were plans being made for surgery on the fifth metatarsal head in June however his blood sugar was apparently too high for anesthesia. Apparently the area was debrided and opened again in June and it is never closed since. Looking over the records from podiatry I am really not able to follow this. It was clear when he was first seen it was before 5/14 at that point he already had a wound. By 5/17 the ulcer was resolved. I do not see anything about a procedure. On 5/28 noted to have  pre-ulcerative moderate keratosis. X-ray noted 1/5 contracted toe and tailor's bunion and metatarsal deformity. On a visit date on 09/28/2018 the dorsal part of the left foot it healed and resolved. There was concern about swelling in his lower extremity he was sent to the ER.. As far as I can tell he was seen in the ER on 7/12 with an ulcer on his left foot. A DVT rule out of the left leg was negative. I do not  think I have complete records from podiatry but I am not able to verify the procedures this patient states he had. He states after the last procedure the wound has never closed although I am not able to follow this in the records I have from podiatry. He has not had a recent x-ray The patient has been using Neosporin on the wound. He is wearing a Darco shoe. He is still very active up on his foot working and exercising. Past medical history; type 2 diabetes ketosis-prone, leg swelling with a negative DVT study in July. Non-smoker ABI in our clinic was 0.85 on the left 10/16; substantial wound on the plantar left fifth met head extending laterally almost to the dorsal fifth MTP. We have been using silver alginate we gave him a Darco forefoot off loader. An x-ray did not show evidence of osteomyelitis did note soft tissue emphysema which I think was due to gas tracking through an open wound. There is no doubt in my mind he requires an MRI 10/23; MRI not booked until 3 November at the earliest this is largely due to his glucose sensor in the right arm. We have been using silver alginate. There has been an improvement 10/29; I am still not exactly sure when his MRI is booked for. He says it is the third but it is the 10th in epic. This definitely needs to be done. He is running a low-grade fever today but no other symptoms. No real improvement in the 1 02/26/2019 patient presents today for a follow-up visit here in our clinic he is last been seen in the clinic on October 29. Subsequently we were  working on getting MRI to evaluate and see what exactly was going on and where we would need to go from the standpoint of whether or not he had osteomyelitis and again what treatments were going be required. Subsequently the patient ended up being admitted to the hospital on 02/07/2019 and was discharged on 02/14/2019. This is a somewhat interesting admission with a discharge diagnosis of pneumonia due to COVID-19 although he was positive for COVID-19 when tested at the urgent care but negative x2 when he was actually in the hospital. With that being said he did have acute respiratory failure with hypoxia and it was noted he also have a left foot ulceration with osteomyelitis. With that being said he did require oxygen for his pneumonia and I level 4 L. He was placed on antivirals and steroids for the COVID-19. He was also transferred to the [facility] at one point. Nonetheless he did subsequently discharged home and since being home has done much better in that regard. The CT angiogram did not show any pulmonary embolism. With regard to the osteomyelitis the patient was placed on vancomycin and Zosyn while in the hospital but has been changed to Augmentin at discharge. It was also recommended that he follow- up with wound care and podiatry. Podiatry however wanted him to see [name] according to the patient prior to them doing anything further. His hemoglobin A1c was 9.9 as noted in the hospital. Have an MRI of the left foot performed while in the hospital on 02/04/2019. 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 There is an overlying plantar open wound noted an abscess tracking back along the lateral aspect of the fifth metatarsal shaft. There is otherwise diffuse cellulitis and mild fasciitis without findings of polymyositis. The patient  did have recently pneumonia secondary to COVID-19 I looked in the  chart through epic and it does appear that the patient may need to have an additional x-ray just to ensure everything is cleared and that he has no airspace disease prior to putting him into the [name]. 03/05/2019; patient was readmitted to the clinic last week. He was hospitalized twice for a viral upper respiratory tract infection from 11/1 through 11/4 and then 11/5 through 11/12 ultimately this turned out to be Covid pneumonitis. Although he was discharged on oxygen he is not using it. He says he feels fine. He has no exercise limitation no cough no sputum. His O2 sat in our clinic today was 100% on room air. 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 He received Vanco and Zosyn in the hospital and then was discharged on 2 weeks of Augmentin. I do not see any relevant cultures. He was supposed to follow-up with infectious disease but I do not see that he has an appointment. 12/8; patient saw Dr. Novella Olive of infectious disease last week. He felt that he had had adequate antibiotic therapy. He did not go to follow-up with Dr. Amalia Hailey of podiatry and I have again talked to him about the pros and cons of this. He does not want to consider a ray amputation of this time. He is aware of the risks of recurrence, migration etc. He started HBO today and tolerated this well. He can complete the Augmentin that I gave him last week. I have looked over the lab work that Dr. Chana Bode ordered his C-reactive protein was 3.3 and his sedimentation rate was 17. The C-reactive protein is never really been measurably that high in this patient 12/15; not much change in the wound today however he has undermining along the lateral part of the foot again more extensively than last week. He has some rims of epithelialization. We have been using silver alginate. He is undergoing hyperbarics but did not dive  today 12/18; in for his obligatory first total contact cast change. Unfortunately there was pus coming from the undermining area around his fifth metatarsal head. This was cultured but will preclude reapplication of a cast. He is seen in conjunction with HBO 12/24; patient had staph lugdunensis in the wound in the undermining area laterally last time. We put him on doxycycline which should have covered this. The wound looks better today. I am going to give him another week of doxycycline before reattempting the total contact cast 12/31; the patient is completing antibiotics. Hemorrhagic debris in the distal part of the wound with some undermining distally. He also had hyper granulation. Extensive debridement with a #5 curette. The infected area that was on the lateral part of the fifth met head is closed over. I do not think he needs any more antibiotics. Patient was seen prior to HBO. Preparations for a total contact cast were made in the cast will be placed post hyperbarics 04/11/19; once again the patient arrives today without complaint. He had been in a cast all week noted that he had heavy drainage this week. This resulted in large raised areas of macerated tissue around the wound 1/14; wound bed looks better slightly smaller. Hydrofera Blue has been changing himself. He had a heavy drainage last week which caused a lot of maceration around the wound so I took him out of a total contact cast he  says the drainage is actually better this week He is seen today in conjunction with HBO 1/21; returns to clinic. He was up in [place] for a day or 2 attending a funeral. He comes back in with the wound larger and with a large area of exposed bone. He had osteomyelitis and septic arthritis of the fifth left metatarsal head while he was in hospital. He received IV antibiotics in the hospital for a prolonged period of time then 3 weeks of Augmentin. Subsequently I gave him 2 weeks of doxycycline for more  superficial wound infection. When I saw this last week the wound was smaller the surface of the wound looks satisfactory. 1/28; patient missed hyperbarics today. Bone biopsy I did last time showed Enterococcus faecalis and Staphylococcus lugdunensis . He has a wide area of exposed bone. We are going to use silver alginate as of today. I had another ethical discussion with the patient. 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 In this situation I think the likelihood of healing this is low. Therefore I have recommended a ray amputation and with the patient's agreement I have referred him to Dr. Doran Durand. The other issue is that his compliance with hyperbarics has been minimal because of his work schedule and given his underlying decision I am going to stop this today READMISSION 10/24/2019 MRI 09/29/2019 left foot IMPRESSION: 1. Apparent skin ulceration inferior and lateral to the 5th metatarsal base with underlying heterogeneous T2 signal and enhancement in the subcutaneous fat. Small peripherally enhancing fluid collections along the plantar and lateral aspects of the 5th metatarsal base suspicious for abscesses. 2. Interval amputation through the mid 5th metatarsal with nonspecific low-level marrow edema and enhancement. Given the proximity to the adjacent soft tissue inflammatory changes, osteomyelitis cannot be excluded. 3. The additional bones appear unremarkable. MRI 09/29/2019 right foot IMPRESSION: 1. Soft tissue ulceration lateral to the 5th MTP joint. There is low-level T2 hyperintensity within the 4th and 5th metatarsal heads and adjacent proximal phalanges without abnormal T1 signal or cortical destruction. These findings are nonspecific and could be seen with early marrow edema, hyperemia or early osteomyelitis. No evidence of septic joint. 2. Mild tenosynovitis and synovial enhancement associated with the extensor digitorum tendons at the  level of the midfoot. 3. Diffuse low-level muscular T2 hyperintensity and enhancement, most consistent with diabetic myopathy. LEFT FOOT BONE Methicillin resistant staphylococcus aureus Staphylococcus lugdunensis MIC MIC CIPROFLOXACIN >=8 RESISTANT Resistant <=0.5 SENSI... Sensitive CLINDAMYCIN <=0.25 SENS... Sensitive >=8 RESISTANT Resistant ERYTHROMYCIN >=8 RESISTANT Resistant >=8 RESISTANT Resistant GENTAMICIN <=0.5 SENSI... Sensitive <=0.5 SENSI... Sensitive Inducible Clindamycin NEGATIVE Sensitive NEGATIVE Sensitive OXACILLIN >=4 RESISTANT Resistant 2 SENSITIVE Sensitive RIFAMPIN <=0.5 SENSI... Sensitive <=0.5 SENSI... Sensitive TETRACYCLINE <=1 SENSITIVE Sensitive <=1 SENSITIVE Sensitive TRIMETH/SULFA <=10 SENSIT Sensitive <=10 SENSIT Sensitive ... Marland Kitchen.. VANCOMYCIN 1 SENSITIVE Sensitive <=0.5 SENSI... Sensitive Right foot bone . Component 3 wk ago Specimen Description BONE Special Requests RIGHT 4 METATARSAL SAMPLE B Gram Stain NO WBC SEEN NO ORGANISMS SEEN Culture RARE METHICILLIN RESISTANT STAPHYLOCOCCUS AUREUS NO ANAEROBES ISOLATED Performed at [facility], [place] [address] [place], [place] [address] Report Status 10/08/2019 FINAL Organism ID, Bacteria METHICILLIN RESISTANT STAPHYLOCOCCUS AUREUS Resulting Agency [facility] Susceptibility Methicillin resistant staphylococcus aureus MIC CIPROFLOXACIN >=8 RESISTANT Resistant CLINDAMYCIN <=0.25 SENS... Sensitive ERYTHROMYCIN >=8 RESISTANT Resistant GENTAMICIN <=0.5 SENSI... Sensitive Inducible Clindamycin NEGATIVE Sensitive OXACILLIN >=4 RESISTANT Resistant RIFAMPIN <=0.5 SENSI... Sensitive TETRACYCLINE <=1 SENSITIVE Sensitive TRIMETH/SULFA <=10 SENSIT Sensitive ... VANCOMYCIN 1 SENSITIVE Sensitive This is  a patient we had in clinic earlier this year with a wound over his left fifth metatarsal head. 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 Doran Durand on 05/16/2019. He seems to have developed problems with wounds on his bilateral feet in June 2021 although he may have had problems earlier than this. He was in an urgent care with a right foot ulcer on 09/26/2019 and given a course of doxycycline. This was apparently after having trouble getting into see orthopedics. He was seen by podiatry on 09/28/2019 noted to have bilateral lower extremity ulcers including the left lateral fifth metatarsal base and the right subfifth met head. It was noted that had purulent drainage at that time. He required hospitalization from 6/20 through 7/2. This was because of worsening right foot wounds. He underwent bilateral operative incision and drainage and bone biopsies bilaterally. Culture results are listed above. He has been referred back to clinic by Dr. Jacqualyn Posey of podiatry. He is also followed by Dr. Megan Salon who saw him yesterday. He was discharged from hospital on Zyvox Flagyl and Levaquin and yesterday changed to doxycycline Flagyl and Levaquin. His inflammatory markers on 6/26 showed a sedimentation rate of 129 and a C-reactive protein of 5. This is improved to 14 and 1.3 respectively. This would indicate improvement. ABIs in our clinic today were 1.23 on the right and 1.20 on the left 11/01/2019 on evaluation today patient appears to be doing fairly well in regard to the wounds on his feet at this point. Fortunately there is no signs of active infection at this time. No fevers, chills, nausea, vomiting, or diarrhea. He currently is seeing infectious disease and still under their care at this point. Subsequently he also has both wounds which she has not been using collagen on as he did not receive that in his packaging he did not call us and  let us know that. Apparently that just was missed on the order. Nonetheless we will get that straightened out today. 8/9-Patient returns for bilateral foot wounds, using Prisma with hydrogel moistened dressings, and the wounds appear stable. Patient using surgical shoes, avoiding much pressure or weightbearing as much as possible 8/16; patient has bilateral foot wounds. 1 on the right lateral foot proximally the other is on the left mid lateral foot. Both required debridement of callus and thick skin around the wounds. We have been using silver collagen 8/27; patient has bilateral lateral foot wounds. The area on the left substantially surrounded by callus and dry skin. This was removed from the wound edge. The underlying wound is small. The area on the right measured somewhat smaller today. 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 Unfortunately I did not update his antibiotics during today's visit. 9/10 I reviewed Dr. Hale Bogus last notes he felt he had completed antibiotics his inflammatory markers were reasonably well controlled. He has a small wound on the lateral left foot and a tiny area on the right which is just above closed. He is using Hydrofera Blue with border foam he has bilateral surgical shoes 9/24; 2 week f/u. doing well. right foot is closed. left foot still undermined. 10/14; right foot remains closed at the fifth met head. The area  over the base of the left fifth metatarsal has a small open area but considerable undermining towards the plantar foot. Thick callus skin around this suggests an adequate pressure relief. We have talked about this. He says he is going to go back into his cam boot. I suggested a total contact cast he did not seem enamored with this suggestion 10/26; left foot base of the fifth metatarsal. Same condition as last time. He has skin over the area with an open wound however the skin is not  adherent. He went to see Dr. Earleen Newport who did an x-ray and culture of his foot I have not reviewed the x-ray but the patient was not told anything. He is on doxycycline 11/11; since the patient was last here he was in the emergency room on 10/30 he was concerned about swelling in the left foot. They did not do any cultures or x-rays. They changed his antibiotics to cephalexin. Previous culture showed group B strep. The cephalexin is appropriate as doxycycline has less than predictable coverage. Arrives in clinic today with swelling over this area under the wound. He also has a new wound on the right fifth metatarsal head 11/18; the patient has a difficult wound on the lateral aspect of the left fifth metatarsal head. The wound was almost ballotable last week I opened it slightly expecting to see purulence however there was just bleeding. I cultured this this was negative. X-ray unchanged. We are trying to get an MRI but I am not sure were going to be able to get this through his insurance. He also has an area on the right lateral fifth metatarsal head this looks healthier 12/3; the patient finally got our MRI. Surprisingly this did not show osteomyelitis. I did show the soft tissue ulceration at the lateral plantar aspect of the fifth metatarsal base with a tiny residual 6 mm abscess overlying the superficial fascia I have tried to culture this area I have not been able to get this to grow anything. Nevertheless the protruding tissue looks aggravated. I suspect we should try to treat the underlying "abscess with broad-spectrum antibiotics. I am going to start him on Levaquin and Flagyl. He has much less edema in his legs and I am going to continue to wrap his legs and see him weekly 12/10. I started Levaquin and Flagyl on him last week. He just picked up the Flagyl apparently there was some delay. The worry is the wound on the left fifth metatarsal base which is substantial and worsening. His foot looks  like he inverts at the ankle making this a weightbearing surface. Certainly no improvement in fact I think the measurements of this are somewhat worse. We have been using 12/17; he apparently just got the Levaquin yesterday this is 2 weeks after the fact. He has completed the Flagyl. The area over the left fifth metatarsal base still has protruding granulation tissue although it does not look quite as bad as it did some weeks ago. He has severe bilateral lymphedema although we have not been treating him for wounds on his legs this is definitely going to require compression. There was so much edema in the left I did not wish to put him in a total contact cast today. I am going to increase his compression from 3-4 layer. The area on the right lateral fifth met head actually look quite good and superficial. 12/23; patient arrived with callus on the right fifth met head and the substantial hyper granulated callused wound  on the base of his fifth metatarsal. He says he is completing his Levaquin in 2 days but I do not think that adds up with what I gave him but I will have to double check this. We are using Hydrofera Blue on both areas. My plan is to put the left leg in a cast the week after New Year's 04/06/2020; patient's wounds about the same. Right lateral fifth metatarsal head and left lateral foot over the base of the fifth metatarsal. There is undermining on the left lateral foot which I removed before application of total contact cast continuing with Hydrofera Blue new. Patient tells me he was seen by endocrinology today lab work was done [Dr. Kerr]. Also wondering whether he was referred to cardiology. I went over some lab work from previously does not have chronic renal failure certainly not nephrotic range proteinuria he does have very poorly controlled diabetes but this is not his most updated lab work. Hemoglobin A1c has been over 11 1/10; the patient had a considerable amount of leakage towards  mid part of his left foot with macerated skin however the wound surface looks better the area on the right lateral fifth met head is better as well. I am going to change the dressing on the left foot under the total contact cast to silver alginate, continue with Hydrofera Blue on the right. 1/20; patient was in the total contact cast for 10 days. Considerable amount of drainage although the skin around the wound does not look too bad on the left foot. The area on the right fifth metatarsal head is closed. Our nursing staff reports large amount of drainage out of the left lateral foot wound 1/25; continues with copious amounts of drainage described by our intake staff. PCR culture I did last week showed E. coli and Enterococcus faecalis and low quantities. Multiple resistance genes documented including extended spectrum beta lactamase, MRSA, MRSE, quinolone, tetracycline. The wound is not quite as good this week as it was 5 days ago but about the same size 2/3; continues with copious amounts of malodorous drainage per our intake nurse. The PCR culture I did 2 weeks ago showed E. coli and low quantities of Enterococcus. There were multiple resistance genes detected. I put Neosporin on him last week although this does not seem to have helped. The wound is slightly deeper today. Offloading continues to be an issue here although with the amount of drainage she has a total contact cast is just not going to work 2/10; moderate amount of drainage. Patient reports he cannot get his stocking on over the dressing. I told him we have to do that the nurse gave him suggestions on how to make this work. The wound is on the bottom and lateral part of his left foot. Is cultured predominantly grew low amounts of Enterococcus, E. coli and anaerobes. There were multiple resistance genes detected including extended spectrum beta lactamase, quinolone, tetracycline. I could not think of an easy oral combination to address  this so for now I am going to do topical antibiotics provided by [facility] I think the main agents here are vancomycin and an aminoglycoside. We have to be able to give him access to the wounds to get the topical antibiotic on 2/17; moderate amount of drainage this is unchanged. He has his Keystone topical antibiotic against the deep tissue culture organisms. He has been using this and changing the dressing daily. Silver alginate on the wound surface. 2/24; using Keystone antibiotic with silver  alginate on the top. He had too much drainage for a total contact cast at one point although I think that is improving and I think in the next week or 2 it might be possible to replace a total contact cast I did not do this today. In general the wound surface looks healthy however he continues to have thick rims of skin and subcutaneous tissue around the wide area of the circumference which I debrided 06/04/2020 upon evaluation today patient appears to be doing well in regard to his wound. I do feel like he is showing signs of improvement. There is little bit of callus and dead tissue around the edges of the wound as well as what appears to be a little bit of a sinus tract that is off to the side laterally I would perform debridement to clear that away today. 3/17; left lateral foot. The wound looks about the same as I remember. Not much depth surface looks healthy. No evidence of infection 3/25; left lateral foot. Wound surface looks about the same. Separating epithelium from the circumference. There really is no evidence of infection here however not making progress by my view 3/29; left lateral foot. Surface of the wound again looks reasonably healthy still thick skin and subcutaneous tissue around the wound margins. There is no evidence of infection. One of the concerns being brought up by the nurses has again the amount of drainage vis--vis continued use of a total contact cast 4/5; left lateral foot at  roughly the base of the fifth metatarsal. Nice healthy looking granulated tissue with rims of epithelialization. The overall wound measurements are not any better but the tissue looks healthy. The only concern is the amount of drainage although he has no surrounding maceration with what we have been doing recently to absorb fluid and protect his skin. He also has lymphedema. 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 He states that the kerlix/coban under the TCC rolled down and could not get it back up. He has been using an offloading scooter and has somehow hurt his right foot using this device. This happened last week. He states that the side of his right foot developed a blister and opened. The top of his foot also has a few small open wounds he thinks is due to his socks rubbing in his shoes. He has not been using any dressings to the wound. He denies purulent drainage, fever/chills or erythema to the wounds. 4/22; patient presents for 1 week follow-up. He developed new wounds to the right foot that were evaluated at last clinic visit. He continues to have a total contact cast to the left leg and he reports no issues. He has been using silver collagen to the right foot wounds with no issues. He denies purulent drainage, fever/chills or erythema to the right foot wounds. He has no complaints today 4/25; patient presents for 1 week follow-up. He has a total contact cast of the left leg and reports no issues. He has been using silver alginate to the right foot wound. He denies purulent drainage, fever/chills or erythema to the right foot wounds. 5/2 patient presents for 1 week follow-up. T contact cast on the left. The wound which is on the  base of the  plantar foot at the base of the fifth metatarsal otal actually looks quite good and dimensions continue to gradually contract. HOWEVER the area on the right lateral fifth metatarsal head is much larger than what I remember from 2 weeks ago. Once more is he has significant levels of hypergranulation. Noteworthy that he had this same hyper granulated response on his wound on the left foot at one point in time. So much so that he I thought there was an underlying fluid collection. Based on this I think this just needs debridement. 5/9; the wound on the left actually continues to be gradually smaller with a healthy surface. Slight amount of drainage and maceration of the skin around but not too bad. However he has a large wound over the right fifth metatarsal head very much in the same configuration as his left foot wound was initially. I used silver nitrate to address the hyper granulated tissue no mechanical debridement 5/16; area on the left foot did not look as healthy this week deeper thick surrounding macerated skin and subcutaneous tissue. oo The area on the right foot fifth met head was about the same oo The area on the right ankle that we identified last week is completely broken down into an open wound presumably a stocking rubbing issue 5/23; patient has been using a total contact cast to the left side. He has been using silver alginate underneath. He has also been using silver alginate to the right foot wounds. He has no complaints today. He denies any signs of infection. 5/31; the left-sided wound looks some better measure smaller surface granulation looks better. We have been using silver alginate under the total contact cast oo The large area on his right fifth met head and right dorsal foot look about the same still using silver alginate 6/6; neither side is good as I was hoping although the surface area dimensions are better. A lot of maceration on his left and right foot around the  wound edge. Area on the dorsal right foot looks better. He says he was traveling. I am not sure what does the amount of maceration around the plantar wounds may be drainage issues 6/13; in general the wound surfaces look quite good on both sides. Macerated skin and raised edges around the wound required debridement although in general especially on the left the surface area seems improved. oo The area on the right dorsal ankle is about the same I thought this would not be such a problem to close 6/20; not much change in either wound although the one on the right looks a little better. Both wounds have thick macerated edges to the skin requiring debridements. We have been using silver alginate. The area on the dorsal right ankle is still open I thought this would be closed. 6/28; patient comes in today with a marked deterioration in the right foot wound fifth met head. Wide area of exposed bone this is a drastic change from last time. The area on the left there we have been casting is stagnant. We have been using silver alginate in both wound areas. 7/5; bone culture I did for PCR last time was positive for Pseudomonas, group B strep, Enterococcus and Staph aureus. There was no suggestion of methicillin resistance or ampicillin resistant genes. This was resistant to tetracycline however He comes into the clinic today with the area over his right plantar fifth metatarsal head which had been doing so well 2 weeks ago completely necrotic feeling  bone. I do not know that this is going to be salvageable. The left foot wound is certainly no smaller but it has a better surface and is superficial. 7/8; patient called in this morning to say that his total contact cast was rubbing against his foot. He states he is doing fine overall. He denies signs of infection. 7/12; continued deterioration in the wound over the right fifth metatarsal head crumbling bone. This is not going to be salvageable. The patient  agrees and wants to be referred to Dr. Doran Durand which we will attempt to arrange as soon as possible. I am going to continue him on antibiotics as long as that takes so I will renew those today. The area on the left foot which is the base of the fifth metatarsal continues to look somewhat better. Healthy looking tissue no depth no debridement is necessary here. 7/20; the patient was kindly seen by Dr. Doran Durand of orthopedics on 10/19/2020. 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 Towards this end we have taken him out of the total contact cast on the left we will put him in a wrap with Hydrofera Blue. As I understand things surgery is planned for 7/21 7/27; patient had his surgery last Thursday. 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 Apparently everything went well we did not still disturb that today The area on the left foot actually looks quite good. He has been much less mobile which probably explains this he did not seem to do well in the total contact cast secondary to drainage and maceration I think. 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 Fortunately there is no evidence of active infection at this time. No fevers, chills, nausea, vomiting, or diarrhea. Overall I think that he is actually doing extremely well. Nonetheless I do believe that he is staying off of this more following the surgery in his right foot that is the reason the left is doing so great. 8/16; left plantar foot wound. This looks smaller than the last time I saw this he is using Hydrofera Blue. The surgical wound on the right foot is being followed by Dr. Doran Durand we did not look at this today. He has surgical shoes on both feet 8/23; left plantar foot wound not as good this week. Surrounding macerated skin and subcutaneous  tissue everything looks moist and wet. I do not think he is offloading this adequately. He is using a surgical shoe Apparently the right foot surgical wound is not open although I did not check his foot 8/31; left plantar foot lateral aspect. Much improved this week. He has no maceration. Some improvement in the surface area of the wound but most impressively the depth is come in we are using silver alginate. The patient is a Product/process development scientist. He is asked that we write him a letter so he can go back to work. I have also tried to see if we can write something that will allow him to limit the amount of time that he is on his foot at work. 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 Hopefully the latter can be avoided 9/6; I believe the patient missed an appointment last  week. He arrives in today with a wound looking roughly the same certainly no better. Undermining laterally and also inferiorly. We used molecuLight today in training with the patient's permission.. We are using silver alginate 9/21 wound is measuring bigger this week although this may have to do with the aggressive circumferential debridement last week in response to the blush fluorescence on the MolecuLight. Culture I did last week showed significant MSSA and E. coli. I put him on Augmentin but he has not started it yet. We are also going to send this for compounded antibiotics at [facility]. There is no evidence of systemic infection 9/29; silver alginate. His Keystone arrived. He is completing Augmentin in 2 days. Offloading in a cam boot. Moderate drainage per our intake staff 10/5; using silver alginate. He has been using his [place]. He has completed his Augmentin. Per our intake nurse still a lot of drainage, far too much to consider a total contact cast. Wound measures about the same. He had the same undermining area that I defined last week from  a roughly 11-3. I remove this today 10/12; using silver alginate he is using the [place]. He comes in for a nurse visit hence we are applying [employer] twice a week. Measuring slightly better today and less notable drainage. Extensive debridement of the wound edge last time 10/18; using topical Keystone and silver alginate and a soft cast. Wound measurements about the same. Drainage was through his soft cast. We are changing this twice a week Tuesdays and Friday 10/25; comes in with moderate drainage. Still using Keystone silver alginate and a soft cast. Wound dimensions completely the same.He has a lot of edema in the left leg he has lymphedema. Asking for [name] to consider wrapping him as he cannot get his stocking on over the soft cast 11/2; comes in with moderate to large drainage slightly smaller in terms of width we have been using [place]. His wound looks satisfactory but not much improvement 11/4; patient presents today for obligatory cast change. Has no issues or complaints today. He denies signs of infection. 11/9; patient traveled this weekend to [place], was on the cast quite a bit. Staining of the cast with black material from his walking boot. Drainage was not quite as bad as we feared. Using silver alginate and Keystone 11/16; we do not have size for cast therefore we have been putting a soft cast on him since the change on Friday. Still a significant amount of drainage necessitating changing twice a week. We have been using the Keystone at cast changes either hard or soft as well as silver alginate Comes in the clinic with things actually looking fairly good improvement in width. He says his offloading is about the same 02/24/2021 upon evaluation today patient actually comes back in and is doing excellent in regard to his foot ulcer this is significantly smaller even compared to the last visit. The soft cast seems to have done extremely well for him which is great news. I do not see any  signs of infection minimal debridement will be needed today. 11/30; left lateral foot much improved half a centimeter improvement in surface area. No evidence of infection. He seems to be doing better with the soft cast in the TCC therefore we will continue with this. He comes back in later in the week for a change with the nurses. This is due to drainage 12/6; no improvement in dimensions. Under illumination some debris on the surface we have been using silver alginate,  soft cast. If there is anything optimistic here he seems to have have less drainage 12/13. Dimensions are improved both length and width and slightly in depth. Appears to be quite healthy today. Raised edges of this thick skin and callus around the edges however. He is in a soft cast were bringing him back once for a change on Friday. Drainage is better 12/20. Dimensions are improved. He still has raised edges of thick skin and callus around the edges. We are using a soft cast 12/28; comes in today with thick callus around the wound. Using silver under alginate under a soft cast. I do not think there is much improvement in any measurement Objective Constitutional Patient is hypertensive.. Pulse regular and within target range for patient.Marland Kitchen Respirations regular, non-labored and within target range.. Temperature is normal and within the target range for the patient.Marland Kitchen Appears in no distress. Vitals Time Taken: 12:48 PM, Height: 77 in, Source: Stated, Weight: 280 lbs, Source: Stated, BMI: 33.2, Temperature: 98.6 F, Pulse: 91 bpm, Respiratory Rate: 18 breaths/min, Blood Pressure: 152/99 mmHg. General Notes: pt did not check blood sugar this morning General Notes: Wound exam; no change in measurement. Thick callus and skin around the wound edge. Aggressive debridement of callus skin and subcutaneous tissue around the wound margins. Integumentary (Hair, Skin) Wound #3 status is Open. Original cause of wound was Trauma. The date  acquired was: 10/02/2019. The wound has been in treatment 74 weeks. The wound is located on the Left,Lateral Foot. The wound measures 1.4cm length x 1.3cm width x 0.3cm depth; 1.429cm^2 area and 0.429cm^3 volume. There is Fat Layer (Subcutaneous Tissue) exposed. There is no tunneling noted, however, there is undermining starting at 4:00 and ending at 8:00 with a maximum distance of 0.3cm. There is a medium amount of serosanguineous drainage noted. The wound margin is thickened. There is large (67-100%) red granulation within the wound bed. There is no necrotic tissue within the wound bed. 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 There was a Excisional Skin/Subcutaneous Tissue Debridement with a total area of 2.55 sq cm performed by [name], MD. With the following instrument(s): Curette to remove Viable and Non-Viable tissue/material. Material removed includes Callus, Subcutaneous Tissue, and Skin: Epidermis after achieving pain control using Lidocaine 4% Topical Solution. No specimens were taken. A time out was conducted at 13:15, prior to the start of the procedure. A Minimum amount of bleeding was controlled with Pressure. The procedure was tolerated well with a pain level of 0 throughout and a pain level of 0 following the procedure. Post Debridement Measurements: 1.5cm length x 1.7cm width x 0.3cm depth; 0.601cm^3 volume. Character of Wound/Ulcer Post Debridement is improved. Severity of Tissue Post Debridement is: Fat layer exposed. Post procedure Diagnosis Wound #3: Same as Pre-Procedure Pre-procedure diagnosis of Wound #3 is a Diabetic Wound/Ulcer of the Lower Extremity  located on the Left,Lateral Foot . There was a T Contact Cast otal Procedure by [name], MD. Post procedure Diagnosis Wound #3: Same as Pre-Procedure Plan Follow-up Appointments: Return Appointment in 1 week. - Dr. Arcola Jansky Shower/ Hygiene: May shower with protection but do not get wound dressing(s) wet. Edema Control - Lymphedema / SCD / Other: Elevate legs to  the level of the heart or above for 30 minutes daily and/or when sitting, a frequency of: - throughout the day Avoid standing for long periods of time. Exercise regularly Moisturize legs daily. - right leg every night before bed. Compression stocking or Garment 20-30 mm/Hg pressure to: - Apply to right leg in the morning and remove at night. Off-Loading: T Contact Cast to Left Lower Extremity otal Other: - minimal weight bearing left foot Additional Orders / Instructions: Follow Nutritious Diet WOUND #3: - Foot Wound Laterality: Left, Lateral Cleanser: Soap and Water 1 x Per Week/30 Days Discharge Instructions: May shower and wash wound with dial antibacterial soap and water prior to dressing change. Cleanser: Wound Cleanser (Generic) 1 x Per Week/30 Days Discharge Instructions: Cleanse the wound with wound cleanser prior to applying a clean dressing using gauze sponges, not tissue or cotton balls. Peri-Wound Care: Zinc Oxide Ointment 30g tube 1 x Per Week/30 Days Discharge Instructions: Apply Zinc Oxide to periwound with each dressing change as needed. Topical: keystone antibiotic compound 1 x Per Week/30 Days Discharge Instructions: thin layer to wound bed Prim Dressing: KerraCel Ag Gelling Fiber Dressing, 4x5 in (silver alginate) (Generic) 1 x Per Week/30 Days ary Discharge Instructions: Apply silver alginate to wound bed as instructed Secondary Dressing: ABD Pad, 8x10 1 x Per Week/30 Days Discharge Instructions: Apply over primary dressing as directed. Secondary Dressing: Zetuvit Plus 4x8 in (Generic) 1  x Per Week/30 Days Discharge Instructions: Apply over primary dressing as directed. Com pression Wrap: Kerlix Roll 4.5x3.1 (in/yd) 1 x Per Week/30 Days Discharge Instructions: Apply Kerlix and Coban compression as directed. 1. Our nurse who did his intake reported not a lot of drainage which was previously the issue with total contact cast 2. Therefore I elected to continue with silver alginate, Zetuvit, kerlix under a total contact cast. We will see how this does next week. Electronic Signature(s) Signed: 03/31/2021 3:46:55 PM By: [name] MD Entered By: [name] on 03/31/2021 13:32:12 -------------------------------------------------------------------------------- Total Contact Cast Details Patient Name: Date of Service: [name] 03/31/2021 12:45 PM Medical Record Number: [id number] Patient Account Number: [id number] Date of Birth/Sex: Treating RN: 1985-09-04 (35 y.o. [name] Primary Care Provider: [name] Other Clinician: Referring Provider: Treating Provider/Extender: [name] in Treatment: 64 T Contact Cast Applied for Wound Assessment: otal Wound #3 Left,Lateral Foot Performed By: Physician [name], MD Post Procedure Diagnosis Same as Pre-procedure Electronic Signature(s) Signed: 03/31/2021 3:46:55 PM By: [name] MD Entered By: [name] on 03/31/2021 13:29:01 -------------------------------------------------------------------------------- SuperBill Details Patient Name: Date of Service: [name], [name] 03/31/2021 Medical Record Number: [id number] Patient Account Number: [id number] Date of Birth/Sex: Treating RN: June 06, 1985 (35 y.o. [name] Primary Care Provider: [name] Other Clinician: Referring Provider: Treating Provider/Extender: [name] in Treatment: 74 Diagnosis Coding ICD-10 Codes Code Description E11.621 Type 2 diabetes  mellitus with foot ulcer L97.528 Non-pressure chronic ulcer of other part of left foot with other specified severity Facility Procedures CPT4 Code: [id number] Description: 71696 - DEB SUBQ TISSUE 20 SQ CM/< ICD-10 Diagnosis Description L97.528 Non-pressure chronic ulcer of other part of left foot with other specified seve Modifier: rity Quantity: 1 Physician Procedures : CPT4 Code Description Modifier [id number] - WC PHYS SUBQ TISS 20 SQ CM ICD-10 Diagnosis Description L97.528 Non-pressure chronic ulcer of other part of left foot with other specified severity Quantity: 1 Electronic Signature(s) Signed: 03/31/2021 3:46:55 PM By: [name] MD  Entered By: [name] on 03/31/2021 13:32:27

## 2021-04-06 ENCOUNTER — Other Ambulatory Visit: Payer: Self-pay

## 2021-04-06 ENCOUNTER — Encounter (HOSPITAL_BASED_OUTPATIENT_CLINIC_OR_DEPARTMENT_OTHER): Payer: BC Managed Care – PPO | Attending: Internal Medicine | Admitting: Internal Medicine

## 2021-04-06 DIAGNOSIS — L97518 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ity: Secondary | ICD-10-CM | POA: Insufficient documentation

## 2021-04-06 DIAGNOSIS — L97528 Non-pressure chronic ulcer of other part of left foot with other specified severity: Secondary | ICD-10-CM | POA: Insufficient documentation

## 2021-04-06 DIAGNOSIS — E11621 Type 2 diabetes mellitus with foot ulcer: Secondary | ICD-10-CM | POA: Insufficient documentation

## 2021-04-06 NOTE — Progress Notes (Signed)
[name] ([id number]) Visit Report for 04/06/2021 Arrival Information Details Patient Name: Date of Service: [name] 04/06/2021 12:30 PM Medical Record Number: [id number] Patient Account Number: [id number] Date of Birth/Sex: Treating RN: 1985/12/03 (36 y.o. [name] Primary Care Kosisochukwu Goldberg: [name] Other Clinician: Referring Eisley Barber: Treating Sandra Tellefsen/Extender: [name] in Treatment: 45 Visit Information History Since Last Visit Added or deleted any medications: No Patient Arrived: Ambulatory Any new allergies or adverse reactions: No Arrival Time: 12:45 Had a fall or experienced change in No Accompanied By: alone activities of daily living that may affect Transfer Assistance: None risk of falls: Patient Identification Verified: Yes Signs or symptoms of abuse/neglect since last visito No Secondary Verification Process Completed: Yes Hospitalized since last visit: No Patient Requires Transmission-Based Precautions: No Implantable device outside of the clinic excluding No Patient Has Alerts: No cellular tissue based products placed in the center since last visit: Has Dressing in Place as Prescribed: Yes Has Footwear/Offloading in Place as Prescribed: Yes Right: T Contact Cast otal Pain Present Now: No Electronic Signature(s) Signed: 04/06/2021 5:47:12 PM By: [name] RN, BSN Entered By: [name] on 04/06/2021 12:46:03 -------------------------------------------------------------------------------- Encounter Discharge Information Details Patient Name: Date of Service: [name] 04/06/2021 12:30 PM Medical Record Number: [id number] Patient Account Number: [id number] Date of Birth/Sex: Treating RN: [birth year] (36 y.o. [name] Primary Care Zuleyka Kloc: [name] Other Clinician: Referring Amberle Lyter: Treating Keren Alverio/Extender: [name] in Treatment: 73 Encounter  Discharge Information Items Post Procedure Vitals Discharge Condition: Stable Temperature (F): 99.1 Ambulatory Status: Ambulatory Pulse (bpm): 92 Discharge Destination: Home Respiratory Rate (breaths/min): 18 Transportation: Private Auto Blood Pressure (mmHg): 140/95 Accompanied By: alone Schedule Follow-up Appointment: Yes Clinical Summary of Care: Patient Declined Electronic Signature(s) Signed: 04/06/2021 5:47:12 PM By: [name] RN, BSN Entered By: [name] on 04/06/2021 14:49:45 -------------------------------------------------------------------------------- Lower Extremity Assessment Details Patient Name: Date of Service: [name] 04/06/2021 12:30 PM Medical Record Number: [id number] Patient Account Number: [id number] Date of Birth/Sex: Treating RN: Oct 21, 1985 (36 y.o. [name] Primary Care Cleophas Yoak: [name] Other Clinician: Referring Jaylie Neaves: Treating Prisilla Kocsis/Extender: [name] in Treatment: 75 Edema Assessment Assessed: [name]: No] [name]: No] Edema: [Left: Ye] [Right: s] Calf Left: Right: Point of Measurement: 48 cm From Medial Instep 51 cm Ankle Left: Right: Point of Measurement: 11 cm From Medial Instep 31 cm Vascular Assessment Pulses: Dorsalis Pedis Palpable: [Left:Yes] Electronic Signature(s) Signed: 04/06/2021 5:47:12 PM By: [name] RN, BSN Entered By: [name] on 04/06/2021 12:59:48 -------------------------------------------------------------------------------- Multi Wound Chart Details Patient Name: Date of Service: [name] 04/06/2021 12:30 PM Medical Record Number: [id number] Patient Account Number: [id number] Date of Birth/Sex: Treating RN: March 21, 1986 (36 y.o. [name] Primary Care Kwana Ringel: [name] Other Clinician: Referring Delsy Etzkorn: Treating Chelby Salata/Extender: [name] in Treatment: 75 Vital Signs Height(in):  77 Capillary Blood Glucose(mg/dl): 120 Weight(lbs): 280 Pulse(bpm): 92 Body Mass Index(BMI): 33 Blood Pressure(mmHg): 140/95 Temperature(F): 99.1 Respiratory Rate(breaths/min): 18 Photos: [3:Left, Lateral Foot] [N/A:N/A N/A] Wound Location: [3:Trauma] [N/A:N/A] Wounding Event: [3:Diabetic Wound/Ulcer of the Lower] [N/A:N/A] Primary Etiology: [3:Extremity Type II Diabetes] [N/A:N/A] Comorbid History: [3:10/02/2019] [N/A:N/A] Date Acquired: [3:75] [N/A:N/A] Weeks of Treatment: [3:Open] [N/A:N/A] Wound Status: [3:1.4x1.2x0.2] [N/A:N/A] Measurements L x W x [name] [3:1.319] [N/A:N/A] A (cm) : rea [3:0.264] [N/A:N/A] Volume (cm) : [3:20.00%] [N/A:N/A] % Reduction in A [3:rea: -60.00%] [N/A:N/A] % Reduction in Volume: [3:Grade 2] [N/A:N/A] Classification: [  3:Medium] [N/A:N/A] Exudate A mount: [3:Serosanguineous] [N/A:N/A] Exudate Type: [3:red, brown] [N/A:N/A] Exudate Color: [3:Thickened] [N/A:N/A] Wound Margin: [3:Large (67-100%)] [N/A:N/A] Granulation A mount: [3:Pink] [N/A:N/A] Granulation Quality: [3:None Present (0%)] [N/A:N/A] Necrotic A mount: [3:Fat Layer (Subcutaneous Tissue): Yes N/A] Exposed Structures: [3:Fascia: No Tendon: No Muscle: No Joint: No Bone: No Medium (34-66%)] [N/A:N/A] Epithelialization: [3:Debridement - Selective/Open Wound N/A] Debridement: Pre-procedure Verification/Time Out 13:06 [N/A:N/A] Taken: [3:Callus] [N/A:N/A] Tissue Debrided: [3:Skin/Epidermis] [N/A:N/A] Level: [3:1.68] [N/A:N/A] Debridement A (sq cm): [3:rea Curette] [N/A:N/A] Instrument: [3:Minimum] [N/A:N/A] Bleeding: [3:Pressure] [N/A:N/A] Hemostasis A chieved: [3:0] [N/A:N/A] Procedural Pain: [3:0] [N/A:N/A] Post Procedural Pain: [3:Procedure was tolerated well] [N/A:N/A] Debridement Treatment Response: [3:1.4x1.2x0.2] [N/A:N/A] Post Debridement Measurements L x W x [name] [3:0.264] [N/A:N/A] Post Debridement Volume: (cm) [3:Debridement] [N/A:N/A] Procedures Performed: [3:T  Contact Cast otal] Treatment Notes Electronic Signature(s) Signed: 04/06/2021 3:57:23 PM By: [name] MD Signed: 04/06/2021 5:47:12 PM By: [name] RN, BSN Entered By: [name] on 04/06/2021 13:23:14 -------------------------------------------------------------------------------- Multi-Disciplinary Care Plan Details Patient Name: Date of Service: [name] 04/06/2021 12:30 PM Medical Record Number: [id number] Patient Account Number: [id number] Date of Birth/Sex: Treating RN: 23-Nov-1985 (36 y.o. [name] Primary Care Martyna Thorns: [name] Other Clinician: Referring Arval Brandstetter: Treating Tobin Witucki/Extender: [name] in Treatment: 57 Multidisciplinary Care Plan reviewed with physician Active Inactive Nutrition Nursing Diagnoses: Imbalanced nutrition Potential for alteratiion in Nutrition/Potential for imbalanced nutrition Goals: Patient/caregiver agrees to and verbalizes understanding of need to use nutritional supplements and/or vitamins as prescribed Date Initiated: 10/24/2019 Date Inactivated: 04/06/2020 Target Resolution Date: 04/03/2020 Goal Status: Met Patient/caregiver will maintain therapeutic glucose control Date Initiated: 10/24/2019 Target Resolution Date: 04/30/2021 Goal Status: Active Interventions: Assess HgA1c results as ordered upon admission and as needed Assess patient nutrition upon admission and as needed per policy Provide education on elevated blood sugars and impact on wound healing Provide education on nutrition Treatment Activities: Education provided on Nutrition : 11/24/2020 Notes: 11/17/20: Glucose control ongoing issue, target date extended. 01/26/21: Glucose management continues. Wound/Skin Impairment Nursing Diagnoses: Impaired tissue integrity Knowledge deficit related to ulceration/compromised skin integrity Goals: Patient/caregiver will verbalize understanding of skin care regimen Date  Initiated: 10/24/2019 Target Resolution Date: 04/30/2021 Goal Status: Active Ulcer/skin breakdown will have a volume reduction of 30% by week 4 Date Initiated: 10/24/2019 Date Inactivated: 01/16/2020 Target Resolution Date: 01/10/2020 Unmet Reason: no change in Goal Status: Unmet measurements. Interventions: Assess patient/caregiver ability to obtain necessary supplies Assess patient/caregiver ability to perform ulcer/skin care regimen upon admission and as needed Assess ulceration(s) every visit Provide education on ulcer and skin care Notes: 11/17/20: Wound care regimen continues Electronic Signature(s) Signed: 04/06/2021 5:47:12 PM By: [name] RN, BSN Entered By: [name] on 04/06/2021 13:06:21 -------------------------------------------------------------------------------- Pain Assessment Details Patient Name: Date of Service: [name] 04/06/2021 12:30 PM Medical Record Number: [id number] Patient Account Number: [id number] Date of Birth/Sex: Treating RN: 1985/08/21 (36 y.o. [name] Primary Care Keera Altidor: [name] Other Clinician: Referring Sundance Moise: Treating Detravion Tester/Extender: [name] in Treatment: 36 Active Problems Location of Pain Severity and Description of Pain Patient Has Paino No Site Locations Pain Management and Medication Current Pain Management: Electronic Signature(s) Signed: 04/06/2021 5:47:12 PM By: [name] RN, BSN Entered By: [name] on 04/06/2021 12:59:40 -------------------------------------------------------------------------------- Patient/Caregiver Education Details Patient Name: Date of Service: [name] 1/3/2023andnbsp12:30 PM Medical Record Number: [id number] Patient Account Number: [id number] Date of Birth/Gender: Treating RN: [birth year] (36 y.o. [name] Primary Care Physician:  [name] Other Clinician: Referring Physician: Treating  Physician/Extender: [name] in Treatment: 78 Education Assessment Education Provided To: Patient Education Topics Provided Wound/Skin Impairment: Methods: Explain/Verbal Responses: State content correctly [employer]) Signed: 04/06/2021 5:47:12 PM By: [name] RN, BSN Entered By: [name] on 04/06/2021 14:48:10 -------------------------------------------------------------------------------- Wound Assessment Details Patient Name: Date of Service: [name] 04/06/2021 12:30 PM Medical Record Number: [id number] Patient Account Number: [id number] Date of Birth/Sex: Treating RN: May 25, 1985 (36 y.o. [name] Primary Care Mariyanna Mucha: [name] Other Clinician: Referring Trust Leh: Treating Adena Sima/Extender: [name] in Treatment: 75 Wound Status Wound Number: 3 Primary Etiology: Diabetic Wound/Ulcer of the Lower Extremity Wound Location: Left, Lateral Foot Wound Status: Open Wounding Event: Trauma Comorbid History: Type II Diabetes Date Acquired: 10/02/2019 Weeks Of Treatment: 75 Clustered Wound: No Photos Wound Measurements Length: (cm) 1.4 Width: (cm) 1.2 Depth: (cm) 0.2 Area: (cm) 1.319 Volume: (cm) 0.264 % Reduction in Area: 20% % Reduction in Volume: -60% Epithelialization: Medium (34-66%) Tunneling: No Undermining: No Wound Description Classification: Grade 2 Wound Margin: Thickened Exudate Amount: Medium Exudate Type: Serosanguineous Exudate Color: red, brown Foul Odor After Cleansing: No Slough/Fibrino No Wound Bed Granulation Amount: Large (67-100%) Exposed Structure Granulation Quality: Pink Fascia Exposed: No Necrotic Amount: None Present (0%) Fat Layer (Subcutaneous Tissue) Exposed: Yes Tendon Exposed: No Muscle Exposed: No Joint Exposed: No Bone Exposed: No Electronic Signature(s) Signed: 04/06/2021 4:35:58 PM By: Rhae Hammock RN Signed:  04/06/2021 5:47:12 PM By: [name] RN, BSN Entered By: Rhae Hammock on 04/06/2021 12:59:30 -------------------------------------------------------------------------------- Vitals Details Patient Name: Date of Service: [name] 04/06/2021 12:30 PM Medical Record Number: [id number] Patient Account Number: [id number] Date of Birth/Sex: Treating RN: [birth year] (36 y.o. [name] Primary Care Lesleigh Hughson: [name] Other Clinician: Referring Kamaree Wheatley: Treating Jhalil Silvera/Extender: [name] in Treatment: 66 Vital Signs Time Taken: 12:45 Temperature (F): 99.1 Height (in): 77 Pulse (bpm): 92 Weight (lbs): 280 Respiratory Rate (breaths/min): 18 Body Mass Index (BMI): 33.2 Blood Pressure (mmHg): 140/95 Capillary Blood Glucose (mg/dl): 120 Reference Range: 80 - 120 mg / dl Notes glucose per pt report Electronic Signature(s) Signed: 04/06/2021 5:47:12 PM By: [name] RN, BSN Entered By: [name] on 04/06/2021 12:46:28

## 2021-04-06 NOTE — Progress Notes (Signed)
[place], [name] ([id number]) Visit Report for 04/06/2021 Debridement Details Patient Name: Date of Service: [name] 04/06/2021 12:30 PM Medical Record Number: [id number] Patient Account Number: [id number] Date of Birth/Sex: Treating RN: 1985-08-12 (36 y.o. [name] Primary Care Provider: [name] Other Clinician: Referring Provider: Treating Provider/Extender: [name] in Treatment: 75 Debridement Performed for Assessment: Wound #3 Left,Lateral Foot Performed By: Physician [name], MD Debridement Type: Debridement Severity of Tissue Pre Debridement: Fat layer exposed Level of Consciousness (Pre-procedure): Awake and Alert Pre-procedure Verification/Time Out Yes - 13:06 Taken: Start Time: 13:06 T Area Debrided (L x W): otal 1.4 (cm) x 1.2 (cm) = 1.68 (cm) Tissue and other material debrided: Non-Viable, Callus, Subcutaneous, Skin: Epidermis Level: Skin/Subcutaneous Tissue Debridement Description: Excisional Instrument: Curette Bleeding: Minimum Hemostasis Achieved: Pressure End Time: 13:07 Procedural Pain: 0 Post Procedural Pain: 0 Response to Treatment: Procedure was tolerated well Level of Consciousness (Post- Awake and Alert procedure): Post Debridement Measurements of Total Wound Length: (cm) 1.4 Width: (cm) 1.2 Depth: (cm) 0.2 Volume: (cm) 0.264 Character of Wound/Ulcer Post Debridement: Improved Severity of Tissue Post Debridement: Fat layer exposed Post Procedure Diagnosis Same as Pre-procedure Electronic Signature(s) Signed: 04/06/2021 3:57:23 PM By: [name] MD Signed: 04/06/2021 5:47:12 PM By: [name] RN, BSN Entered By: [name] on 04/06/2021 13:23:38 -------------------------------------------------------------------------------- HPI Details Patient Name: Date of Service: [name], [name] 04/06/2021 12:30 PM Medical Record Number: [id number] Patient Account Number:  [id number] Date of Birth/Sex: Treating RN: 08/14/1985 (36 y.o. 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 He comes in for review of a wound over the plantar fifth metatarsal head extending into the lateral part of the foot. He was followed for this previously by his podiatrist Dr. Cornelius Moras. As the patient tells his story he went to see podiatry first for a swelling he developed on the lateral part of his fifth metatarsal head in May. He states this was "open" by podiatry and the area closed. He was followed up in June and it was again opened callus removed and it closed promptly. There were plans being made for surgery on the fifth metatarsal head in June however his blood sugar was apparently too high for anesthesia. Apparently the area was debrided and opened again in June and it is never closed since. Looking over the records from podiatry I am really not able to follow this. It was clear when he was first seen it was before 5/14 at that point he already had a wound. By 5/17 the ulcer was resolved. I do not see anything about a procedure. On 5/28 noted to have pre-ulcerative moderate keratosis. X-ray noted 1/5 contracted toe and tailor's bunion and metatarsal deformity. On a visit date on 09/28/2018 the dorsal part of the left foot it healed and resolved. There was concern about swelling in his lower extremity he was sent to the ER.. As far as I can tell he was seen in the ER on 7/12 with an ulcer on his left foot. A DVT rule out of the left leg was negative. I do not think I have complete records from podiatry but I am not able to verify the procedures this patient states he had. He states after the last procedure the  wound  has never closed although I am not able to follow this in the records I have from podiatry. He has not had a recent x-ray The patient has been using Neosporin on the wound. He is wearing a Darco shoe. He is still very active up on his foot working and exercising. Past medical history; type 2 diabetes ketosis-prone, leg swelling with a negative DVT study in July. Non-smoker ABI in our clinic was 0.85 on the left 10/16; substantial wound on the plantar left fifth met head extending laterally almost to the dorsal fifth MTP. We have been using silver alginate we gave him a Darco forefoot off loader. An x-ray did not show evidence of osteomyelitis did note soft tissue emphysema which I think was due to gas tracking through an open wound. There is no doubt in my mind he requires an MRI 10/23; MRI not booked until 3 November at the earliest this is largely due to his glucose sensor in the right arm. We have been using silver alginate. There has been an improvement 10/29; I am still not exactly sure when his MRI is booked for. He says it is the third but it is the 10th in epic. This definitely needs to be done. He is running a low-grade fever today but no other symptoms. No real improvement in the 1 02/26/2019 patient presents today for a follow-up visit here in our clinic he is last been seen in the clinic on October 29. Subsequently we were working on getting MRI to evaluate and see what exactly was going on and where we would need to go from the standpoint of whether or not he had osteomyelitis and again what treatments were going be required. Subsequently the patient ended up being admitted to the hospital on 02/07/2019 and was discharged on 02/14/2019. This is a somewhat interesting admission with a discharge diagnosis of pneumonia due to COVID-19 although he was positive for COVID-19 when tested at the urgent care but negative x2 when he was actually in the hospital. With that being said he did  have acute respiratory failure with hypoxia and it was noted he also have a left foot ulceration with osteomyelitis. With that being said he did require oxygen for his pneumonia and I level 4 L. He was placed on antivirals and steroids for the COVID-19. He was also transferred to the [facility] at one point. Nonetheless he did subsequently discharged home and since being home has done much better in that regard. The CT angiogram did not show any pulmonary embolism. With regard to the osteomyelitis the patient was placed on vancomycin and Zosyn while in the hospital but has been changed to Augmentin at discharge. It was also recommended that he follow- up with wound care and podiatry. Podiatry however wanted him to see [name] according to the patient prior to them doing anything further. His hemoglobin A1c was 9.9 as noted in the hospital. Have an MRI of the left foot performed while in the hospital on 02/04/2019. 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 There is an overlying plantar open wound noted an abscess tracking back along the lateral aspect of the fifth metatarsal shaft. There is otherwise diffuse cellulitis and mild fasciitis without findings of polymyositis. The patient did have recently pneumonia secondary to COVID-19 I looked in the chart through epic and it does appear that the patient may need to have an additional x-ray just  to ensure everything is cleared and that he has no airspace disease prior to putting him into the chamber. 03/05/2019; patient was readmitted to the clinic last week. He was hospitalized twice for a viral upper respiratory tract infection from 11/1 through 11/4 and then 11/5 through 11/12 ultimately this turned out to be Covid pneumonitis. Although he was discharged on oxygen he is not using it. He says he feels fine. He has no exercise limitation no cough no sputum.  His O2 sat in our clinic today was 100% on room air. 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 He received Vanco and Zosyn in the hospital and then was discharged on 2 weeks of Augmentin. I do not see any relevant cultures. He was supposed to follow-up with infectious disease but I do not see that he has an appointment. 12/8; patient saw Dr. Novella Olive of infectious disease last week. He felt that he had had adequate antibiotic therapy. He did not go to follow-up with Dr. Amalia Hailey of podiatry and I have again talked to him about the pros and cons of this. He does not want to consider a ray amputation of this time. He is aware of the risks of recurrence, migration etc. He started HBO today and tolerated this well. He can complete the Augmentin that I gave him last week. I have looked over the lab work that Dr. Chana Bode ordered his C-reactive protein was 3.3 and his sedimentation rate was 17. The C-reactive protein is never really been measurably that high in this patient 12/15; not much change in the wound today however he has undermining along the lateral part of the foot again more extensively than last week. He has some rims of epithelialization. We have been using silver alginate. He is undergoing hyperbarics but did not dive today 12/18; in for his obligatory first total contact cast change. Unfortunately there was pus coming from the undermining area around his fifth metatarsal head. This was cultured but will preclude reapplication of a cast. He is seen in conjunction with HBO 12/24; patient had staph lugdunensis in the wound in the undermining area laterally last time. We put him on doxycycline which should have covered this. The wound looks better today. I am going to give him another week of doxycycline before reattempting the total contact cast 12/31; the patient is completing  antibiotics. Hemorrhagic debris in the distal part of the wound with some undermining distally. He also had hyper granulation. Extensive debridement with a #5 curette. The infected area that was on the lateral part of the fifth met head is closed over. I do not think he needs any more antibiotics. Patient was seen prior to HBO. Preparations for a total contact cast were made in the cast will be placed post hyperbarics 04/11/19; once again the patient arrives today without complaint. He had been in a cast all week noted that he had heavy drainage this week. This resulted in large raised areas of macerated tissue around the wound 1/14; wound bed looks better slightly smaller. Hydrofera Blue has been changing himself. He had a heavy drainage last week which caused a lot of maceration around the wound so I took him out of a total contact cast he says the drainage is actually better this week He is seen today in conjunction with HBO 1/21; returns to clinic. He was up in [place] for a day or  2 attending a funeral. He comes back in with the wound larger and with a large area of exposed bone. He had osteomyelitis and septic arthritis of the fifth left metatarsal head while he was in hospital. He received IV antibiotics in the hospital for a prolonged period of time then 3 weeks of Augmentin. Subsequently I gave him 2 weeks of doxycycline for more superficial wound infection. When I saw this last week the wound was smaller the surface of the wound looks satisfactory. 1/28; patient missed hyperbarics today. Bone biopsy I did last time showed Enterococcus faecalis and Staphylococcus lugdunensis . He has a wide area of exposed bone. We are going to use silver alginate as of today. I had another ethical discussion with the patient. 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 In this situation I think the likelihood of healing this is low. Therefore I have recommended a ray  amputation and with the patient's agreement I have referred him to Dr. Doran Durand. The other issue is that his compliance with hyperbarics has been minimal because of his work schedule and given his underlying decision I am going to stop this today READMISSION 10/24/2019 MRI 09/29/2019 left foot IMPRESSION: 1. Apparent skin ulceration inferior and lateral to the 5th metatarsal base with underlying heterogeneous T2 signal and enhancement in the subcutaneous fat. Small peripherally enhancing fluid collections along the plantar and lateral aspects of the 5th metatarsal base suspicious for abscesses. 2. Interval amputation through the mid 5th metatarsal with nonspecific low-level marrow edema and enhancement. Given the proximity to the adjacent soft tissue inflammatory changes, osteomyelitis cannot be excluded. 3. The additional bones appear unremarkable. MRI 09/29/2019 right foot IMPRESSION: 1. Soft tissue ulceration lateral to the 5th MTP joint. There is low-level T2 hyperintensity within the 4th and 5th metatarsal heads and adjacent proximal phalanges without abnormal T1 signal or cortical destruction. These findings are nonspecific and could be seen with early marrow edema, hyperemia or early osteomyelitis. No evidence of septic joint. 2. Mild tenosynovitis and synovial enhancement associated with the extensor digitorum tendons at the level of the midfoot. 3. Diffuse low-level muscular T2 hyperintensity and enhancement, most consistent with diabetic myopathy. LEFT FOOT BONE Methicillin resistant staphylococcus aureus Staphylococcus lugdunensis MIC MIC CIPROFLOXACIN >=8 RESISTANT Resistant <=0.5 SENSI... Sensitive CLINDAMYCIN <=0.25 SENS... Sensitive >=8 RESISTANT Resistant ERYTHROMYCIN >=8 RESISTANT Resistant >=8 RESISTANT Resistant GENTAMICIN <=0.5 SENSI... Sensitive <=0.5 SENSI... Sensitive Inducible Clindamycin NEGATIVE Sensitive NEGATIVE Sensitive OXACILLIN >=4 RESISTANT Resistant 2  SENSITIVE Sensitive RIFAMPIN <=0.5 SENSI... Sensitive <=0.5 SENSI... Sensitive TETRACYCLINE <=1 SENSITIVE Sensitive <=1 SENSITIVE Sensitive TRIMETH/SULFA <=10 SENSIT Sensitive <=10 SENSIT Sensitive ... Marland Kitchen.. VANCOMYCIN 1 SENSITIVE Sensitive <=0.5 SENSI... Sensitive Right foot bone . Component 3 wk ago Specimen Description BONE Special Requests RIGHT 4 METATARSAL SAMPLE B Gram Stain NO WBC SEEN NO ORGANISMS SEEN Culture RARE METHICILLIN RESISTANT STAPHYLOCOCCUS AUREUS NO ANAEROBES ISOLATED Performed at [facility], [place] [address], [place], [place] [address] Report Status 10/08/2019 FINAL Organism ID, Bacteria METHICILLIN RESISTANT STAPHYLOCOCCUS AUREUS Resulting Agency [facility] Susceptibility Methicillin resistant staphylococcus aureus MIC CIPROFLOXACIN >=8 RESISTANT Resistant CLINDAMYCIN <=0.25 SENS... Sensitive ERYTHROMYCIN >=8 RESISTANT Resistant GENTAMICIN <=0.5 SENSI... Sensitive Inducible Clindamycin NEGATIVE Sensitive OXACILLIN >=4 RESISTANT Resistant RIFAMPIN <=0.5 SENSI... Sensitive TETRACYCLINE <=1 SENSITIVE Sensitive TRIMETH/SULFA <=10 SENSIT Sensitive ... VANCOMYCIN 1 SENSITIVE Sensitive This is a patient we had in clinic earlier this year with a wound over his left fifth metatarsal head. 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 Doran Durand on 05/16/2019. He seems to have developed problems with wounds on his bilateral feet in June 2021 although he may have had problems earlier than this. He was in an urgent care with a right foot ulcer on 09/26/2019 and given a course of doxycycline. This was apparently after having trouble getting into see orthopedics. He was seen by podiatry  on 09/28/2019 noted to have bilateral lower extremity ulcers including the left lateral fifth metatarsal base and the right subfifth met head. It was noted that had purulent drainage at that time. He required hospitalization from 6/20 through 7/2. This was because of worsening right foot wounds. He underwent bilateral operative incision and drainage and bone biopsies bilaterally. Culture results are listed above. He has been referred back to clinic by Dr. Jacqualyn Posey of podiatry. He is also followed by Dr. Megan Salon who saw him yesterday. He was discharged from hospital on Zyvox Flagyl and Levaquin and yesterday changed to doxycycline Flagyl and Levaquin. His inflammatory markers on 6/26 showed a sedimentation rate of 129 and a C-reactive protein of 5. This is improved to 14 and 1.3 respectively. This would indicate improvement. ABIs in our clinic today were 1.23 on the right and 1.20 on the left 11/01/2019 on evaluation today patient appears to be doing fairly well in regard to the wounds on his feet at this point. Fortunately there is no signs of active infection at this time. No fevers, chills, nausea, vomiting, or diarrhea. He currently is seeing infectious disease and still under their care at this point. Subsequently he also has both wounds which she has not been using collagen on as he did not receive that in his packaging he did not call us and let us know that. Apparently that just was missed on the order. Nonetheless we will get that straightened out today. 8/9-Patient returns for bilateral foot wounds, using Prisma with hydrogel moistened dressings, and the wounds appear stable. Patient using surgical shoes, avoiding much pressure or weightbearing as much as possible 8/16; patient has bilateral foot wounds. 1 on the right lateral foot proximally the other is on the left mid lateral foot. Both required debridement of callus and thick skin around the wounds. We have been using silver collagen 8/27;  patient has bilateral lateral foot wounds. The area on the left substantially surrounded by callus and dry skin. This was removed from the wound edge. The underlying wound is small. The area on the right measured somewhat smaller today. 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 Unfortunately I did not update his antibiotics during today's visit. 9/10 I reviewed Dr. Hale Bogus last notes he felt he had completed antibiotics his inflammatory markers were reasonably well controlled. He has a small wound on the lateral left foot and a tiny area on the right which is just above closed. He is using Hydrofera Blue with border foam he has bilateral surgical shoes 9/24; 2 week f/u. doing well. right foot is closed. left foot still undermined. 10/14; right foot remains closed at the fifth met head. The area over the base of the left fifth metatarsal has a small open area but considerable undermining towards the plantar foot. Thick callus skin around this suggests an adequate pressure  relief. We have talked about this. He says he is going to go back into his cam boot. I suggested a total contact cast he did not seem enamored with this suggestion 10/26; left foot base of the fifth metatarsal. Same condition as last time. He has skin over the area with an open wound however the skin is not adherent. He went to see Dr. Earleen Newport who did an x-ray and culture of his foot I have not reviewed the x-ray but the patient was not told anything. He is on doxycycline 11/11; since the patient was last here he was in the emergency room on 10/30 he was concerned about swelling in the left foot. They did not do any cultures or x-rays. They changed his antibiotics to cephalexin. Previous culture showed group B strep. The cephalexin is appropriate as doxycycline has less than predictable coverage. Arrives in clinic today with swelling over this area under the wound.  He also has a new wound on the right fifth metatarsal head 11/18; the patient has a difficult wound on the lateral aspect of the left fifth metatarsal head. The wound was almost ballotable last week I opened it slightly expecting to see purulence however there was just bleeding. I cultured this this was negative. X-ray unchanged. We are trying to get an MRI but I am not sure were going to be able to get this through his insurance. He also has an area on the right lateral fifth metatarsal head this looks healthier 12/3; the patient finally got our MRI. Surprisingly this did not show osteomyelitis. I did show the soft tissue ulceration at the lateral plantar aspect of the fifth metatarsal base with a tiny residual 6 mm abscess overlying the superficial fascia I have tried to culture this area I have not been able to get this to grow anything. Nevertheless the protruding tissue looks aggravated. I suspect we should try to treat the underlying "abscess with broad-spectrum antibiotics. I am going to start him on Levaquin and Flagyl. He has much less edema in his legs and I am going to continue to wrap his legs and see him weekly 12/10. I started Levaquin and Flagyl on him last week. He just picked up the Flagyl apparently there was some delay. The worry is the wound on the left fifth metatarsal base which is substantial and worsening. His foot looks like he inverts at the ankle making this a weightbearing surface. Certainly no improvement in fact I think the measurements of this are somewhat worse. We have been using 12/17; he apparently just got the Levaquin yesterday this is 2 weeks after the fact. He has completed the Flagyl. The area over the left fifth metatarsal base still has protruding granulation tissue although it does not look quite as bad as it did some weeks ago. He has severe bilateral lymphedema although we have not been treating him for wounds on his legs this is definitely going to require  compression. There was so much edema in the left I did not wish to put him in a total contact cast today. I am going to increase his compression from 3-4 layer. The area on the right lateral fifth met head actually look quite good and superficial. 12/23; patient arrived with callus on the right fifth met head and the substantial hyper granulated callused wound on the base of his fifth metatarsal. He says he is completing his Levaquin in 2 days but I do not think that adds up with what I gave  him but I will have to double check this. We are using Hydrofera Blue on both areas. My plan is to put the left leg in a cast the week after New Year's 04/06/2020; patient's wounds about the same. Right lateral fifth metatarsal head and left lateral foot over the base of the fifth metatarsal. There is undermining on the left lateral foot which I removed before application of total contact cast continuing with Hydrofera Blue new. Patient tells me he was seen by endocrinology today lab work was done [Dr. Kerr]. Also wondering whether he was referred to cardiology. I went over some lab work from previously does not have chronic renal failure certainly not nephrotic range proteinuria he does have very poorly controlled diabetes but this is not his most updated lab work. Hemoglobin A1c has been over 11 1/10; the patient had a considerable amount of leakage towards mid part of his left foot with macerated skin however the wound surface looks better the area on the right lateral fifth met head is better as well. I am going to change the dressing on the left foot under the total contact cast to silver alginate, continue with Hydrofera Blue on the right. 1/20; patient was in the total contact cast for 10 days. Considerable amount of drainage although the skin around the wound does not look too bad on the left foot. The area on the right fifth metatarsal head is closed. Our nursing staff reports large amount of drainage out  of the left lateral foot wound 1/25; continues with copious amounts of drainage described by our intake staff. PCR culture I did last week showed E. coli and Enterococcus faecalis and low quantities. Multiple resistance genes documented including extended spectrum beta lactamase, MRSA, MRSE, quinolone, tetracycline. The wound is not quite as good this week as it was 5 days ago but about the same size 2/3; continues with copious amounts of malodorous drainage per our intake nurse. The PCR culture I did 2 weeks ago showed E. coli and low quantities of Enterococcus. There were multiple resistance genes detected. I put Neosporin on him last week although this does not seem to have helped. The wound is slightly deeper today. Offloading continues to be an issue here although with the amount of drainage she has a total contact cast is just not going to work 2/10; moderate amount of drainage. Patient reports he cannot get his stocking on over the dressing. I told him we have to do that the nurse gave him suggestions on how to make this work. The wound is on the bottom and lateral part of his left foot. Is cultured predominantly grew low amounts of Enterococcus, E. coli and anaerobes. There were multiple resistance genes detected including extended spectrum beta lactamase, quinolone, tetracycline. I could not think of an easy oral combination to address this so for now I am going to do topical antibiotics provided by [facility] I think the main agents here are vancomycin and an aminoglycoside. We have to be able to give him access to the wounds to get the topical antibiotic on 2/17; moderate amount of drainage this is unchanged. He has his Keystone topical antibiotic against the deep tissue culture organisms. He has been using this and changing the dressing daily. Silver alginate on the wound surface. 2/24; using Keystone antibiotic with silver alginate on the top. He had too much drainage for a total contact  cast at one point although I think that is improving and I think in the next  week or 2 it might be possible to replace a total contact cast I did not do this today. In general the wound surface looks healthy however he continues to have thick rims of skin and subcutaneous tissue around the wide area of the circumference which I debrided 06/04/2020 upon evaluation today patient appears to be doing well in regard to his wound. I do feel like he is showing signs of improvement. There is little bit of callus and dead tissue around the edges of the wound as well as what appears to be a little bit of a sinus tract that is off to the side laterally I would perform debridement to clear that away today. 3/17; left lateral foot. The wound looks about the same as I remember. Not much depth surface looks healthy. No evidence of infection 3/25; left lateral foot. Wound surface looks about the same. Separating epithelium from the circumference. There really is no evidence of infection here however not making progress by my view 3/29; left lateral foot. Surface of the wound again looks reasonably healthy still thick skin and subcutaneous tissue around the wound margins. There is no evidence of infection. One of the concerns being brought up by the nurses has again the amount of drainage vis--vis continued use of a total contact cast 4/5; left lateral foot at roughly the base of the fifth metatarsal. Nice healthy looking granulated tissue with rims of epithelialization. The overall wound measurements are not any better but the tissue looks healthy. The only concern is the amount of drainage although he has no surrounding maceration with what we have been doing recently to absorb fluid and protect his skin. He also has lymphedema. 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 He states that the kerlix/coban under the TCC rolled down and could not get it back up. He has been using an offloading scooter and has somehow hurt his right foot using this device. This happened last week. He states that the side of his right foot developed a blister and opened. The top of his foot also has a few small open wounds he thinks is due to his socks rubbing in his shoes. He has not been using any dressings to the wound. He denies purulent drainage, fever/chills or erythema to the wounds. 4/22; patient presents for 1 week follow-up. He developed new wounds to the right foot that were evaluated at last clinic visit. He continues to have a total contact cast to the left leg and he reports no issues. He has been using silver collagen to the right foot wounds with no issues. He denies purulent drainage, fever/chills or erythema to the right foot wounds. He has no complaints today 4/25; patient presents for 1 week follow-up. He has a total contact cast of the left leg and reports no issues. He has been using silver alginate to the right foot wound. He denies purulent drainage, fever/chills or erythema to the right foot wounds. 5/2 patient presents for 1 week follow-up. T contact cast on the left. The wound which is on the base of the plantar foot at the base of the fifth metatarsal otal actually looks quite good and dimensions continue to gradually contract. HOWEVER the area on the right  lateral fifth metatarsal head is much larger than what I remember from 2 weeks ago. Once more is he has significant levels of hypergranulation. Noteworthy that he had this same hyper granulated response on his wound on the left foot at one point in time. So much so that he I thought there was an underlying fluid collection. Based on this I think this just needs debridement. 5/9; the wound on the left actually  continues to be gradually smaller with a healthy surface. Slight amount of drainage and maceration of the skin around but not too bad. However he has a large wound over the right fifth metatarsal head very much in the same configuration as his left foot wound was initially. I used silver nitrate to address the hyper granulated tissue no mechanical debridement 5/16; area on the left foot did not look as healthy this week deeper thick surrounding macerated skin and subcutaneous tissue. The area on the right foot fifth met head was about the same The area on the right ankle that we identified last week is completely broken down into an open wound presumably a stocking rubbing issue 5/23; patient has been using a total contact cast to the left side. He has been using silver alginate underneath. He has also been using silver alginate to the right foot wounds. He has no complaints today. He denies any signs of infection. 5/31; the left-sided wound looks some better measure smaller surface granulation looks better. We have been using silver alginate under the total contact cast The large area on his right fifth met head and right dorsal foot look about the same still using silver alginate 6/6; neither side is good as I was hoping although the surface area dimensions are better. A lot of maceration on his left and right foot around the wound edge. Area on the dorsal right foot looks better. He says he was traveling. I am not sure what does the amount of maceration around the plantar wounds may be drainage issues 6/13; in general the wound surfaces look quite good on both sides. Macerated skin and raised edges around the wound required debridement although in general especially on the left the surface area seems improved. The area on the right dorsal ankle is about the same I thought this would not be such a problem to close 6/20; not much change in either wound although the one on the right looks a little  better. Both wounds have thick macerated edges to the skin requiring debridements. We have been using silver alginate. The area on the dorsal right ankle is still open I thought this would be closed. 6/28; patient comes in today with a marked deterioration in the right foot wound fifth met head. Wide area of exposed bone this is a drastic change from last time. The area on the left there we have been casting is stagnant. We have been using silver alginate in both wound areas. 7/5; bone culture I did for PCR last time was positive for Pseudomonas, group B strep, Enterococcus and Staph aureus. There was no suggestion of methicillin resistance or ampicillin resistant genes. This was resistant to tetracycline however He comes into the clinic today with the area over his right plantar fifth metatarsal head which had been doing so well 2 weeks ago completely necrotic feeling bone. I do not know that this is going to be salvageable. The left foot wound is certainly no smaller but it has a better surface and is superficial. 7/8; patient called in  this morning to say that his total contact cast was rubbing against his foot. He states he is doing fine overall. He denies signs of infection. 7/12; continued deterioration in the wound over the right fifth metatarsal head crumbling bone. This is not going to be salvageable. The patient agrees and wants to be referred to Dr. Doran Durand which we will attempt to arrange as soon as possible. I am going to continue him on antibiotics as long as that takes so I will renew those today. The area on the left foot which is the base of the fifth metatarsal continues to look somewhat better. Healthy looking tissue no depth no debridement is necessary here. 7/20; the patient was kindly seen by Dr. Doran Durand of orthopedics on 10/19/2020. 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 Towards  this end we have taken him out of the total contact cast on the left we will put him in a wrap with Hydrofera Blue. As I understand things surgery is planned for 7/21 7/27; patient had his surgery last Thursday. 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 Apparently everything went well we did not still disturb that today The area on the left foot actually looks quite good. He has been much less mobile which probably explains this he did not seem to do well in the total contact cast secondary to drainage and maceration I think. 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 Fortunately there is no evidence of active infection at this time. No fevers, chills, nausea, vomiting, or diarrhea. Overall I think that he is actually doing extremely well. Nonetheless I do believe that he is staying off of this more following the surgery in his right foot that is the reason the left is doing so great. 8/16; left plantar foot wound. This looks smaller than the last time I saw this he is using Hydrofera Blue. The surgical wound on the right foot is being followed by Dr. Doran Durand we did not look at this today. He has surgical shoes on both feet 8/23; left plantar foot wound not as good this week. Surrounding macerated skin and subcutaneous tissue everything looks moist and wet. I do not think he is offloading this adequately. He is using a surgical shoe Apparently the right foot surgical wound is not open although I did not check his foot 8/31; left plantar foot lateral aspect. Much improved this week. He has no maceration. Some improvement in the surface area of the wound but most impressively the depth is come in we are using silver alginate. The patient is a Product/process development scientist. He is asked that we write him a letter so he can go back to work. I have also tried to see if we can write something that will  allow him to limit the amount of time that he is on his foot at work. 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 Hopefully the latter can be avoided 9/6; I believe the patient missed an appointment last week. He arrives in today with a wound looking roughly the same certainly no better. Undermining laterally and also inferiorly. We used molecuLight today in training with the patient's permission.. We are using  silver alginate 9/21 wound is measuring bigger this week although this may have to do with the aggressive circumferential debridement last week in response to the blush fluorescence on the MolecuLight. Culture I did last week showed significant MSSA and E. coli. I put him on Augmentin but he has not started it yet. We are also going to send this for compounded antibiotics at [facility]. There is no evidence of systemic infection 9/29; silver alginate. His Keystone arrived. He is completing Augmentin in 2 days. Offloading in a cam boot. Moderate drainage per our intake staff 10/5; using silver alginate. He has been using his [place]. He has completed his Augmentin. Per our intake nurse still a lot of drainage, far too much to consider a total contact cast. Wound measures about the same. He had the same undermining area that I defined last week from a roughly 11-3. I remove this today 10/12; using silver alginate he is using the [place]. He comes in for a nurse visit hence we are applying [employer] twice a week. Measuring slightly better today and less notable drainage. Extensive debridement of the wound edge last time 10/18; using topical Keystone and silver alginate and a soft cast. Wound measurements about the same. Drainage was through his soft cast. We are changing this twice a week Tuesdays and Friday 10/25; comes in with moderate drainage. Still using Keystone silver alginate and a soft cast. Wound dimensions  completely the same.He has a lot of edema in the left leg he has lymphedema. Asking for [name] to consider wrapping him as he cannot get his stocking on over the soft cast 11/2; comes in with moderate to large drainage slightly smaller in terms of width we have been using [place]. His wound looks satisfactory but not much improvement 11/4; patient presents today for obligatory cast change. Has no issues or complaints today. He denies signs of infection. 11/9; patient traveled this weekend to [place], was on the cast quite a bit. Staining of the cast with black material from his walking boot. Drainage was not quite as bad as we feared. Using silver alginate and Keystone 11/16; we do not have size for cast therefore we have been putting a soft cast on him since the change on Friday. Still a significant amount of drainage necessitating changing twice a week. We have been using the Keystone at cast changes either hard or soft as well as silver alginate Comes in the clinic with things actually looking fairly good improvement in width. He says his offloading is about the same 02/24/2021 upon evaluation today patient actually comes back in and is doing excellent in regard to his foot ulcer this is significantly smaller even compared to the last visit. The soft cast seems to have done extremely well for him which is great news. I do not see any signs of infection minimal debridement will be needed today. 11/30; left lateral foot much improved half a centimeter improvement in surface area. No evidence of infection. He seems to be doing better with the soft cast in the TCC therefore we will continue with this. He comes back in later in the week for a change with the nurses. This is due to drainage 12/6; no improvement in dimensions. Under illumination some debris on the surface we have been using silver alginate, soft cast. If there is anything optimistic here he seems to have have less drainage 12/13. Dimensions are  improved both length and width and slightly in depth. Appears to be quite healthy today.  Raised edges of this thick skin and callus around the edges however. He is in a soft cast were bringing him back once for a change on Friday. Drainage is better 12/20. Dimensions are improved. He still has raised edges of thick skin and callus around the edges. We are using a soft cast 12/28; comes in today with thick callus around the wound. Using silver under alginate under a soft cast. I do not think there is much improvement in any measurement 2023 04/06/2021; patient was put in a total contact cast. Unfortunately not much change in surface area Electronic Signature(s) Signed: 04/06/2021 3:57:23 PM By: [name] MD Entered By: [name] on 04/06/2021 13:27:07 -------------------------------------------------------------------------------- Physical Exam Details Patient Name: Date of Service: [name], [name] 04/06/2021 12:30 PM Medical Record Number: [id number] Patient Account Number: [id number] Date of Birth/Sex: Treating RN: 02/25/1986 (36 y.o. [name] Primary Care Provider: [name] Other Clinician: Referring Provider: Treating Provider/Extender: [name] in Treatment: 28 Constitutional Patient is hypertensive.. Pulse regular and within target range for patient.Marland Kitchen Respirations regular, non-labored and within target range.. Temperature is normal and within the target range for the patient.Marland Kitchen Appears in no distress. Notes Wound exam; no change in measurements. Thick callus and skin around the wound surface I used a #5 curette to remove thick skin and callus as well as some's fibrinous debris on the wound surface. Hemostasis with direct pressure Electronic Signature(s) Signed: 04/06/2021 3:57:23 PM By: [name] MD Entered By: [name] on 04/06/2021  13:29:16 -------------------------------------------------------------------------------- Physician Orders Details Patient Name: Date of Service: [name], [name] 04/06/2021 12:30 PM Medical Record Number: [id number] Patient Account Number: [id number] Date of Birth/Sex: Treating RN: May 28, 1985 (36 y.o. [name] Primary Care Provider: [name] Other Clinician: Referring Provider: Treating Provider/Extender: [name] in Treatment: 14 Verbal / Phone Orders: No Diagnosis Coding ICD-10 Coding Code Description E11.621 Type 2 diabetes mellitus with foot ulcer L97.528 Non-pressure chronic ulcer of other part of left foot with other specified severity Follow-up Appointments ppointment in 1 week. - Dr. Dellia Nims Return A Bathing/ Shower/ Hygiene May shower with protection but do not get wound dressing(s) wet. Edema Control - Lymphedema / SCD / Other Bilateral Lower Extremities Elevate legs to the level of the heart or above for 30 minutes daily and/or when sitting, a frequency of: - throughout the day Avoid standing for long periods of time. Exercise regularly Moisturize legs daily. - right leg every night before bed. Compression stocking or Garment 20-30 mm/Hg pressure to: - Apply to right leg in the morning and remove at night. Off-Loading Total Contact Cast to Left Lower Extremity Other: - minimal weight bearing left foot Additional Orders / Instructions Follow Nutritious Diet Wound Treatment Wound #3 - Foot Wound Laterality: Left, Lateral Cleanser: Soap and Water 1 x Per Week/30 Days Discharge Instructions: May shower and wash wound with dial antibacterial soap and water prior to dressing change. Cleanser: Wound Cleanser (Generic) 1 x Per Week/30 Days Discharge Instructions: Cleanse the wound with wound cleanser prior to applying a clean dressing using gauze sponges, not tissue or cotton balls. Peri-Wound Care: Zinc Oxide Ointment 30g tube 1 x  Per Week/30 Days Discharge Instructions: Apply Zinc Oxide to periwound with each dressing change as needed. Topical: keystone antibiotic compound 1 x Per Week/30 Days Discharge Instructions: thin layer to wound bed Prim Dressing: KerraCel Ag Gelling Fiber Dressing, 4x5 in (silver alginate) (Generic) 1 x Per Week/30 Days ary  Discharge Instructions: Apply silver alginate to wound bed as instructed Secondary Dressing: Zetuvit Plus 4x8 in 1 x Per Week/30 Days Discharge Instructions: Apply over primary dressing as directed. Compression Wrap: Kerlix Roll 4.5x3.1 (in/yd) 1 x Per Week/30 Days Discharge Instructions: Apply Kerlix and Coban compression as directed. Compression Wrap: Coban Self-Adherent Wrap 4x5 (in/yd) 1 x Per Week/30 Days Discharge Instructions: Apply over Kerlix as directed. Electronic Signature(s) Signed: 04/06/2021 3:57:23 PM By: [name] MD Signed: 04/06/2021 5:47:12 PM By: [name] RN, BSN Entered By: [name] on 04/06/2021 13:12:53 -------------------------------------------------------------------------------- Problem List Details Patient Name: Date of Service: [name], [name] 04/06/2021 12:30 PM Medical Record Number: [id number] Patient Account Number: [id number] Date of Birth/Sex: Treating RN: 1985/08/06 (36 y.o. [name] Primary Care Provider: [name] Other Clinician: Referring Provider: Treating Provider/Extender: [name] in Treatment: 71 Active Problems ICD-10 Encounter Code Description Active Date MDM Diagnosis E11.621 Type 2 diabetes mellitus with foot ulcer 10/24/2019 No Yes L97.528 Non-pressure chronic ulcer of other part of left foot with other specified 10/24/2019 No Yes severity Inactive Problems ICD-10 Code Description Active Date Inactive Date L97.518 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ity 10/24/2019 10/24/2019 L97.518 Non-pressure chronic ulcer  of other part of right foot with other specified severity 07/14/2020 07/14/2020 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, right ankle and foot 10/24/2019 10/24/2019 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 08/10/2020 08/10/2020 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, left ankle and foot 10/24/2019 10/24/2019 B95.62 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 10/24/2019 10/24/2019 classified elsewhere Resolved Problems Electronic Signature(s) Signed: 04/06/2021 3:57:23 PM By: [name] MD Entered By: [name] on 04/06/2021 13:23:06 -------------------------------------------------------------------------------- Progress Note Details Patient Name: Date of Service: [name], [name] 04/06/2021 12:30 PM Medical Record Number: [id number] Patient Account Number: [id number] Date of Birth/Sex: Treating RN: [birth year] (36 y.o. 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 He comes in for review of a wound over the plantar fifth metatarsal head extending into the lateral part of the foot. He was followed for this previously by his podiatrist Dr. Cornelius Moras. As the patient tells his story he went to see podiatry first for a swelling he developed on the lateral part of his fifth metatarsal head in May. He states this was "open" by podiatry and the area closed. He was followed up in June and it was again opened callus removed and it closed promptly. There were plans being made for surgery on the fifth metatarsal head in June however his blood sugar was apparently too high for anesthesia. Apparently the area was debrided and opened again in June and it is  never closed since. Looking over the records from podiatry I am really not able to follow this. It was clear when he was first seen it was before 5/14 at that point he already had a wound. By 5/17 the ulcer was resolved. I do not see anything about a procedure. On 5/28 noted to have pre-ulcerative moderate keratosis. X-ray noted 1/5 contracted toe and tailor's bunion and metatarsal deformity. On a visit date on 09/28/2018 the dorsal part of the left foot it healed and resolved. There was concern about swelling in his lower extremity he was  sent to the ER.. As far as I can tell he was seen in the ER on 7/12 with an ulcer on his left foot. A DVT rule out of the left leg was negative. I do not think I have complete records from podiatry but I am not able to verify the procedures this patient states he had. He states after the last procedure the wound has never closed although I am not able to follow this in the records I have from podiatry. He has not had a recent x-ray The patient has been using Neosporin on the wound. He is wearing a Darco shoe. He is still very active up on his foot working and exercising. Past medical history; type 2 diabetes ketosis-prone, leg swelling with a negative DVT study in July. Non-smoker ABI in our clinic was 0.85 on the left 10/16; substantial wound on the plantar left fifth met head extending laterally almost to the dorsal fifth MTP. We have been using silver alginate we gave him a Darco forefoot off loader. An x-ray did not show evidence of osteomyelitis did note soft tissue emphysema which I think was due to gas tracking through an open wound. There is no doubt in my mind he requires an MRI 10/23; MRI not booked until 3 November at the earliest this is largely due to his glucose sensor in the right arm. We have been using silver alginate. There has been an improvement 10/29; I am still not exactly sure when his MRI is booked for. He says it is the third but it is the  10th in epic. This definitely needs to be done. He is running a low-grade fever today but no other symptoms. No real improvement in the 1 02/26/2019 patient presents today for a follow-up visit here in our clinic he is last been seen in the clinic on October 29. Subsequently we were working on getting MRI to evaluate and see what exactly was going on and where we would need to go from the standpoint of whether or not he had osteomyelitis and again what treatments were going be required. Subsequently the patient ended up being admitted to the hospital on 02/07/2019 and was discharged on 02/14/2019. This is a somewhat interesting admission with a discharge diagnosis of pneumonia due to COVID-19 although he was positive for COVID-19 when tested at the urgent care but negative x2 when he was actually in the hospital. With that being said he did have acute respiratory failure with hypoxia and it was noted he also have a left foot ulceration with osteomyelitis. With that being said he did require oxygen for his pneumonia and I level 4 L. He was placed on antivirals and steroids for the COVID-19. He was also transferred to the [facility] at one point. Nonetheless he did subsequently discharged home and since being home has done much better in that regard. The CT angiogram did not show any pulmonary embolism. With regard to the osteomyelitis the patient was placed on vancomycin and Zosyn while in the hospital but has been changed to Augmentin at discharge. It was also recommended that he follow- up with wound care and podiatry. Podiatry however wanted him to see [name] according to the patient prior to them doing anything further. His hemoglobin A1c was 9.9 as noted in the hospital. Have an MRI of the left foot performed while in the hospital on 02/04/2019. 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 There is an  overlying plantar open wound noted an abscess tracking back along the lateral aspect of the fifth metatarsal shaft. There is otherwise diffuse cellulitis and mild fasciitis without findings of polymyositis. The patient did have recently pneumonia secondary to COVID-19 I looked in the chart through epic and it does appear that the patient may need to have an additional x-ray just to ensure everything is cleared and that he has no airspace disease prior to putting him into the chamber. 03/05/2019; patient was readmitted to the clinic last week. He was hospitalized twice for a viral upper respiratory tract infection from 11/1 through 11/4 and then 11/5 through 11/12 ultimately this turned out to be Covid pneumonitis. Although he was discharged on oxygen he is not using it. He says he feels fine. He has no exercise limitation no cough no sputum. His O2 sat in our clinic today was 100% on room air. 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 He received Vanco and Zosyn in the hospital and then was discharged on 2 weeks of Augmentin. I do not see any relevant cultures. He was supposed to follow-up with infectious disease but I do not see that he has an appointment. 12/8; patient saw Dr. Novella Olive of infectious disease last week. He felt that he had had adequate antibiotic therapy. He did not go to follow-up with Dr. Amalia Hailey of podiatry and I have again talked to him about the pros and cons of this. He does not want to consider a ray amputation of this time. He is aware of the risks of recurrence, migration etc. He started HBO today and tolerated this well. He can complete the Augmentin that I gave him last week. I have looked over the lab work that Dr. Chana Bode ordered his C-reactive protein was 3.3 and his sedimentation rate was 17. The C-reactive protein is never really been measurably that high in  this patient 12/15; not much change in the wound today however he has undermining along the lateral part of the foot again more extensively than last week. He has some rims of epithelialization. We have been using silver alginate. He is undergoing hyperbarics but did not dive today 12/18; in for his obligatory first total contact cast change. Unfortunately there was pus coming from the undermining area around his fifth metatarsal head. This was cultured but will preclude reapplication of a cast. He is seen in conjunction with HBO 12/24; patient had staph lugdunensis in the wound in the undermining area laterally last time. We put him on doxycycline which should have covered this. The wound looks better today. I am going to give him another week of doxycycline before reattempting the total contact cast 12/31; the patient is completing antibiotics. Hemorrhagic debris in the distal part of the wound with some undermining distally. He also had hyper granulation. Extensive debridement with a #5 curette. The infected area that was on the lateral part of the fifth met head is closed over. I do not think he needs any more antibiotics. Patient was seen prior to HBO. Preparations for a total contact cast were made in the cast will be placed post hyperbarics 04/11/19; once again the patient arrives today without complaint. He had been in a cast all week noted that he had heavy drainage this week. This resulted in large raised areas of macerated tissue around the wound 1/14; wound  bed looks better slightly smaller. Hydrofera Blue has been changing himself. He had a heavy drainage last week which caused a lot of maceration around the wound so I took him out of a total contact cast he says the drainage is actually better this week He is seen today in conjunction with HBO 1/21; returns to clinic. He was up in [place] for a day or 2 attending a funeral. He comes back in with the wound larger and with a large area of  exposed bone. He had osteomyelitis and septic arthritis of the fifth left metatarsal head while he was in hospital. He received IV antibiotics in the hospital for a prolonged period of time then 3 weeks of Augmentin. Subsequently I gave him 2 weeks of doxycycline for more superficial wound infection. When I saw this last week the wound was smaller the surface of the wound looks satisfactory. 1/28; patient missed hyperbarics today. Bone biopsy I did last time showed Enterococcus faecalis and Staphylococcus lugdunensis . He has a wide area of exposed bone. We are going to use silver alginate as of today. I had another ethical discussion with the patient. 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 In this situation I think the likelihood of healing this is low. Therefore I have recommended a ray amputation and with the patient's agreement I have referred him to Dr. Doran Durand. The other issue is that his compliance with hyperbarics has been minimal because of his work schedule and given his underlying decision I am going to stop this today READMISSION 10/24/2019 MRI 09/29/2019 left foot IMPRESSION: 1. Apparent skin ulceration inferior and lateral to the 5th metatarsal base with underlying heterogeneous T2 signal and enhancement in the subcutaneous fat. Small peripherally enhancing fluid collections along the plantar and lateral aspects of the 5th metatarsal base suspicious for abscesses. 2. Interval amputation through the mid 5th metatarsal with nonspecific low-level marrow edema and enhancement. Given the proximity to the adjacent soft tissue inflammatory changes, osteomyelitis cannot be excluded. 3. The additional bones appear unremarkable. MRI 09/29/2019 right foot IMPRESSION: 1. Soft tissue ulceration lateral to the 5th MTP joint. There is low-level T2 hyperintensity within the 4th and 5th metatarsal heads and adjacent proximal phalanges without abnormal T1  signal or cortical destruction. These findings are nonspecific and could be seen with early marrow edema, hyperemia or early osteomyelitis. No evidence of septic joint. 2. Mild tenosynovitis and synovial enhancement associated with the extensor digitorum tendons at the level of the midfoot. 3. Diffuse low-level muscular T2 hyperintensity and enhancement, most consistent with diabetic myopathy. LEFT FOOT BONE Methicillin resistant staphylococcus aureus Staphylococcus lugdunensis MIC MIC CIPROFLOXACIN >=8 RESISTANT Resistant <=0.5 SENSI... Sensitive CLINDAMYCIN <=0.25 SENS... Sensitive >=8 RESISTANT Resistant ERYTHROMYCIN >=8 RESISTANT Resistant >=8 RESISTANT Resistant GENTAMICIN <=0.5 SENSI... Sensitive <=0.5 SENSI... Sensitive Inducible Clindamycin NEGATIVE Sensitive NEGATIVE Sensitive OXACILLIN >=4 RESISTANT Resistant 2 SENSITIVE Sensitive RIFAMPIN <=0.5 SENSI... Sensitive <=0.5 SENSI... Sensitive TETRACYCLINE <=1 SENSITIVE Sensitive <=1 SENSITIVE Sensitive TRIMETH/SULFA <=10 SENSIT Sensitive <=10 SENSIT Sensitive ... Marland Kitchen.. VANCOMYCIN 1 SENSITIVE Sensitive <=0.5 SENSI... Sensitive Right foot bone . Component 3 wk ago Specimen Description BONE Special Requests RIGHT 4 METATARSAL SAMPLE B Gram Stain NO WBC SEEN NO ORGANISMS SEEN Culture RARE METHICILLIN RESISTANT STAPHYLOCOCCUS AUREUS NO ANAEROBES ISOLATED Performed at [facility], [place] [address] [place], [place] [address] Report Status 10/08/2019 FINAL Organism ID, Bacteria METHICILLIN RESISTANT STAPHYLOCOCCUS AUREUS Resulting Agency [facility] Susceptibility Methicillin resistant staphylococcus aureus MIC CIPROFLOXACIN >=8 RESISTANT Resistant CLINDAMYCIN <=  0.25 SENS... Sensitive ERYTHROMYCIN >=8 RESISTANT Resistant GENTAMICIN <=0.5 SENSI... Sensitive Inducible Clindamycin NEGATIVE Sensitive OXACILLIN >=4 RESISTANT Resistant RIFAMPIN <=0.5 SENSI... Sensitive TETRACYCLINE <=1 SENSITIVE Sensitive TRIMETH/SULFA <=10  SENSIT Sensitive ... VANCOMYCIN 1 SENSITIVE Sensitive This is a patient we had in clinic earlier this year with a wound over his left fifth metatarsal head. 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 Doran Durand on 05/16/2019. He seems to have developed problems with wounds on his bilateral feet in June 2021 although he may have had problems earlier than this. He was in an urgent care with a right foot ulcer on 09/26/2019 and given a course of doxycycline. This was apparently after having trouble getting into see orthopedics. He was seen by podiatry on 09/28/2019 noted to have bilateral lower extremity ulcers including the left lateral fifth metatarsal base and the right subfifth met head. It was noted that had purulent drainage at that time. He required hospitalization from 6/20 through 7/2. This was because of worsening right foot wounds. He underwent bilateral operative incision and drainage and bone biopsies bilaterally. Culture results are listed above. He has been referred back to clinic by Dr. Jacqualyn Posey of podiatry. He is also followed by Dr. Megan Salon who saw him yesterday. He was discharged from hospital on Zyvox Flagyl and Levaquin and yesterday changed to doxycycline Flagyl and Levaquin. His inflammatory markers on 6/26 showed a sedimentation rate of 129 and a C-reactive protein of 5. This is improved to 14 and 1.3 respectively. This would indicate improvement. ABIs in our clinic today were 1.23 on the right and 1.20 on the left 11/01/2019 on evaluation today patient appears to be doing fairly well in regard to the wounds on his feet at this point. Fortunately there is no signs of active infection at this time. No fevers,  chills, nausea, vomiting, or diarrhea. He currently is seeing infectious disease and still under their care at this point. Subsequently he also has both wounds which she has not been using collagen on as he did not receive that in his packaging he did not call us and let us know that. Apparently that just was missed on the order. Nonetheless we will get that straightened out today. 8/9-Patient returns for bilateral foot wounds, using Prisma with hydrogel moistened dressings, and the wounds appear stable. Patient using surgical shoes, avoiding much pressure or weightbearing as much as possible 8/16; patient has bilateral foot wounds. 1 on the right lateral foot proximally the other is on the left mid lateral foot. Both required debridement of callus and thick skin around the wounds. We have been using silver collagen 8/27; patient has bilateral lateral foot wounds. The area on the left substantially surrounded by callus and dry skin. This was removed from the wound edge. The underlying wound is small. The area on the right measured somewhat smaller today. 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 Unfortunately I did not update his antibiotics during today's visit. 9/10 I reviewed Dr. Hale Bogus last notes he felt he had completed antibiotics his inflammatory markers were reasonably well controlled. He has a small wound on the lateral left foot and a tiny area on the right which is just above closed. He  is using Hydrofera Blue with border foam he has bilateral surgical shoes 9/24; 2 week f/u. doing well. right foot is closed. left foot still undermined. 10/14; right foot remains closed at the fifth met head. The area over the base of the left fifth metatarsal has a small open area but considerable undermining towards the plantar foot. Thick callus skin around this suggests an adequate pressure relief. We have talked about this. He says  he is going to go back into his cam boot. I suggested a total contact cast he did not seem enamored with this suggestion 10/26; left foot base of the fifth metatarsal. Same condition as last time. He has skin over the area with an open wound however the skin is not adherent. He went to see Dr. Earleen Newport who did an x-ray and culture of his foot I have not reviewed the x-ray but the patient was not told anything. He is on doxycycline 11/11; since the patient was last here he was in the emergency room on 10/30 he was concerned about swelling in the left foot. They did not do any cultures or x-rays. They changed his antibiotics to cephalexin. Previous culture showed group B strep. The cephalexin is appropriate as doxycycline has less than predictable coverage. Arrives in clinic today with swelling over this area under the wound. He also has a new wound on the right fifth metatarsal head 11/18; the patient has a difficult wound on the lateral aspect of the left fifth metatarsal head. The wound was almost ballotable last week I opened it slightly expecting to see purulence however there was just bleeding. I cultured this this was negative. X-ray unchanged. We are trying to get an MRI but I am not sure were going to be able to get this through his insurance. He also has an area on the right lateral fifth metatarsal head this looks healthier 12/3; the patient finally got our MRI. Surprisingly this did not show osteomyelitis. I did show the soft tissue ulceration at the lateral plantar aspect of the fifth metatarsal base with a tiny residual 6 mm abscess overlying the superficial fascia I have tried to culture this area I have not been able to get this to grow anything. Nevertheless the protruding tissue looks aggravated. I suspect we should try to treat the underlying "abscess with broad-spectrum antibiotics. I am going to start him on Levaquin and Flagyl. He has much less edema in his legs and I am going to  continue to wrap his legs and see him weekly 12/10. I started Levaquin and Flagyl on him last week. He just picked up the Flagyl apparently there was some delay. The worry is the wound on the left fifth metatarsal base which is substantial and worsening. His foot looks like he inverts at the ankle making this a weightbearing surface. Certainly no improvement in fact I think the measurements of this are somewhat worse. We have been using 12/17; he apparently just got the Levaquin yesterday this is 2 weeks after the fact. He has completed the Flagyl. The area over the left fifth metatarsal base still has protruding granulation tissue although it does not look quite as bad as it did some weeks ago. He has severe bilateral lymphedema although we have not been treating him for wounds on his legs this is definitely going to require compression. There was so much edema in the left I did not wish to put him in a total contact cast today. I am going to increase  his compression from 3-4 layer. The area on the right lateral fifth met head actually look quite good and superficial. 12/23; patient arrived with callus on the right fifth met head and the substantial hyper granulated callused wound on the base of his fifth metatarsal. He says he is completing his Levaquin in 2 days but I do not think that adds up with what I gave him but I will have to double check this. We are using Hydrofera Blue on both areas. My plan is to put the left leg in a cast the week after New Year's 04/06/2020; patient's wounds about the same. Right lateral fifth metatarsal head and left lateral foot over the base of the fifth metatarsal. There is undermining on the left lateral foot which I removed before application of total contact cast continuing with Hydrofera Blue new. Patient tells me he was seen by endocrinology today lab work was done [Dr. Kerr]. Also wondering whether he was referred to cardiology. I went over some lab work from  previously does not have chronic renal failure certainly not nephrotic range proteinuria he does have very poorly controlled diabetes but this is not his most updated lab work. Hemoglobin A1c has been over 11 1/10; the patient had a considerable amount of leakage towards mid part of his left foot with macerated skin however the wound surface looks better the area on the right lateral fifth met head is better as well. I am going to change the dressing on the left foot under the total contact cast to silver alginate, continue with Hydrofera Blue on the right. 1/20; patient was in the total contact cast for 10 days. Considerable amount of drainage although the skin around the wound does not look too bad on the left foot. The area on the right fifth metatarsal head is closed. Our nursing staff reports large amount of drainage out of the left lateral foot wound 1/25; continues with copious amounts of drainage described by our intake staff. PCR culture I did last week showed E. coli and Enterococcus faecalis and low quantities. Multiple resistance genes documented including extended spectrum beta lactamase, MRSA, MRSE, quinolone, tetracycline. The wound is not quite as good this week as it was 5 days ago but about the same size 2/3; continues with copious amounts of malodorous drainage per our intake nurse. The PCR culture I did 2 weeks ago showed E. coli and low quantities of Enterococcus. There were multiple resistance genes detected. I put Neosporin on him last week although this does not seem to have helped. The wound is slightly deeper today. Offloading continues to be an issue here although with the amount of drainage she has a total contact cast is just not going to work 2/10; moderate amount of drainage. Patient reports he cannot get his stocking on over the dressing. I told him we have to do that the nurse gave him suggestions on how to make this work. The wound is on the bottom and lateral part of  his left foot. Is cultured predominantly grew low amounts of Enterococcus, E. coli and anaerobes. There were multiple resistance genes detected including extended spectrum beta lactamase, quinolone, tetracycline. I could not think of an easy oral combination to address this so for now I am going to do topical antibiotics provided by [facility] I think the main agents here are vancomycin and an aminoglycoside. We have to be able to give him access to the wounds to get the topical antibiotic on 2/17; moderate amount of  drainage this is unchanged. He has his [facility]stone topical antibiotic against the deep tissue culture organisms. He has been using this and changing the dressing daily. Silver alginate on the wound surface. 2/24; using Keystone antibiotic with silver alginate on the top. He had too much drainage for a total contact cast at one point although I think that is improving and I think in the next week or 2 it might be possible to replace a total contact cast I did not do this today. In general the wound surface looks healthy however he continues to have thick rims of skin and subcutaneous tissue around the wide area of the circumference which I debrided 06/04/2020 upon evaluation today patient appears to be doing well in regard to his wound. I do feel like he is showing signs of improvement. There is little bit of callus and dead tissue around the edges of the wound as well as what appears to be a little bit of a sinus tract that is off to the side laterally I would perform debridement to clear that away today. 3/17; left lateral foot. The wound looks about the same as I remember. Not much depth surface looks healthy. No evidence of infection 3/25; left lateral foot. Wound surface looks about the same. Separating epithelium from the circumference. There really is no evidence of infection here however not making progress by my view 3/29; left lateral foot. Surface of the wound again looks reasonably  healthy still thick skin and subcutaneous tissue around the wound margins. There is no evidence of infection. One of the concerns being brought up by the nurses has again the amount of drainage vis--vis continued use of a total contact cast 4/5; left lateral foot at roughly the base of the fifth metatarsal. Nice healthy looking granulated tissue with rims of epithelialization. The overall wound measurements are not any better but the tissue looks healthy. The only concern is the amount of drainage although he has no surrounding maceration with what we have been doing recently to absorb fluid and protect his skin. He also has lymphedema. 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 He states that the kerlix/coban under the TCC rolled down and could not get it back up. He has been using an offloading scooter and has somehow hurt his right foot using this device. This happened last week. He states that the side of his right foot developed a blister and opened. The top of his foot also has a few small open wounds he thinks is due to his socks rubbing in his shoes. He has not been using any dressings to the wound. He denies purulent drainage, fever/chills or erythema to the wounds. 4/22; patient presents for 1 week follow-up. He developed new wounds to the right foot that were evaluated at last clinic visit. He continues to have a total contact cast to the left leg and he reports no issues. He has been using silver collagen to the right foot wounds with no issues. He denies purulent drainage, fever/chills or erythema to the right foot wounds. He has no complaints today 4/25; patient presents for 1 week follow-up. He has a total contact cast of the left leg and  reports no issues. He has been  using silver alginate to the right foot wound. He denies purulent drainage, fever/chills or erythema to the right foot wounds. 5/2 patient presents for 1 week follow-up. T contact cast on the left. The wound which is on the base of the plantar foot at the base of the fifth metatarsal otal actually looks quite good and dimensions continue to gradually contract. HOWEVER the area on the right lateral fifth metatarsal head is much larger than what I remember from 2 weeks ago. Once more is he has significant levels of hypergranulation. Noteworthy that he had this same hyper granulated response on his wound on the left foot at one point in time. So much so that he I thought there was an underlying fluid collection. Based on this I think this just needs debridement. 5/9; the wound on the left actually continues to be gradually smaller with a healthy surface. Slight amount of drainage and maceration of the skin around but not too bad. However he has a large wound over the right fifth metatarsal head very much in the same configuration as his left foot wound was initially. I used silver nitrate to address the hyper granulated tissue no mechanical debridement 5/16; area on the left foot did not look as healthy this week deeper thick surrounding macerated skin and subcutaneous tissue. oo The area on the right foot fifth met head was about the same oo The area on the right ankle that we identified last week is completely broken down into an open wound presumably a stocking rubbing issue 5/23; patient has been using a total contact cast to the left side. He has been using silver alginate underneath. He has also been using silver alginate to the right foot wounds. He has no complaints today. He denies any signs of infection. 5/31; the left-sided wound looks some better measure smaller surface granulation looks better. We have been using silver alginate under the total contact  cast oo The large area on his right fifth met head and right dorsal foot look about the same still using silver alginate 6/6; neither side is good as I was hoping although the surface area dimensions are better. A lot of maceration on his left and right foot around the wound edge. Area on the dorsal right foot looks better. He says he was traveling. I am not sure what does the amount of maceration around the plantar wounds may be drainage issues 6/13; in general the wound surfaces look quite good on both sides. Macerated skin and raised edges around the wound required debridement although in general especially on the left the surface area seems improved. oo The area on the right dorsal ankle is about the same I thought this would not be such a problem to close 6/20; not much change in either wound although the one on the right looks a little better. Both wounds have thick macerated edges to the skin requiring debridements. We have been using silver alginate. The area on the dorsal right ankle is still open I thought this would be closed. 6/28; patient comes in today with a marked deterioration in the right foot wound fifth met head. Wide area of exposed bone this is a drastic change from last time. The area on the left there we have been casting is stagnant. We have been using silver alginate in both wound areas. 7/5; bone culture I did for PCR last time was positive for Pseudomonas, group B strep, Enterococcus and Staph aureus. There was no suggestion of methicillin resistance  or ampicillin resistant genes. This was resistant to tetracycline however He comes into the clinic today with the area over his right plantar fifth metatarsal head which had been doing so well 2 weeks ago completely necrotic feeling bone. I do not know that this is going to be salvageable. The left foot wound is certainly no smaller but it has a better surface and is superficial. 7/8; patient called in this morning to say  that his total contact cast was rubbing against his foot. He states he is doing fine overall. He denies signs of infection. 7/12; continued deterioration in the wound over the right fifth metatarsal head crumbling bone. This is not going to be salvageable. The patient agrees and wants to be referred to Dr. Doran Durand which we will attempt to arrange as soon as possible. I am going to continue him on antibiotics as long as that takes so I will renew those today. The area on the left foot which is the base of the fifth metatarsal continues to look somewhat better. Healthy looking tissue no depth no debridement is necessary here. 7/20; the patient was kindly seen by Dr. Doran Durand of orthopedics on 10/19/2020. 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 Towards this end we have taken him out of the total contact cast on the left we will put him in a wrap with Hydrofera Blue. As I understand things surgery is planned for 7/21 7/27; patient had his surgery last Thursday. 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 Apparently everything went well we did not still disturb that today The area on the left foot actually looks quite good. He has been much less mobile which probably explains this he did not seem to do well in the total contact cast secondary to drainage and maceration I think. 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 Fortunately there is no evidence of active infection at this time. No fevers, chills, nausea, vomiting, or diarrhea. Overall I think that he is actually doing extremely well. Nonetheless I do believe that he is staying off of this more following the surgery in his right foot that is the reason the left is doing so great. 8/16; left plantar foot wound. This looks smaller than the last time I  saw this he is using Hydrofera Blue. The surgical wound on the right foot is being followed by Dr. Doran Durand we did not look at this today. He has surgical shoes on both feet 8/23; left plantar foot wound not as good this week. Surrounding macerated skin and subcutaneous tissue everything looks moist and wet. I do not think he is offloading this adequately. He is using a surgical shoe Apparently the right foot surgical wound is not open although I did not check his foot 8/31; left plantar foot lateral aspect. Much improved this week. He has no maceration. Some improvement in the surface area of the wound but most impressively the depth is come in we are using silver alginate. The patient is a Product/process development scientist. He is asked that we write him a letter so he can go back to work. I have also tried to see if we can write something that will allow him to limit the amount of time that he is on his foot at work.  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 Hopefully the latter can be avoided 9/6; I believe the patient missed an appointment last week. He arrives in today with a wound looking roughly the same certainly no better. Undermining laterally and also inferiorly. We used molecuLight today in training with the patient's permission.. We are using silver alginate 9/21 wound is measuring bigger this week although this may have to do with the aggressive circumferential debridement last week in response to the blush fluorescence on the MolecuLight. Culture I did last week showed significant MSSA and E. coli. I put him on Augmentin but he has not started it yet. We are also going to send this for compounded antibiotics at [facility]. There is no evidence of systemic infection 9/29; silver alginate. His Keystone arrived. He is completing Augmentin in 2 days. Offloading in a cam boot. Moderate drainage per our intake staff 10/5;  using silver alginate. He has been using his [place]. He has completed his Augmentin. Per our intake nurse still a lot of drainage, far too much to consider a total contact cast. Wound measures about the same. He had the same undermining area that I defined last week from a roughly 11-3. I remove this today 10/12; using silver alginate he is using the [place]. He comes in for a nurse visit hence we are applying [employer] twice a week. Measuring slightly better today and less notable drainage. Extensive debridement of the wound edge last time 10/18; using topical Keystone and silver alginate and a soft cast. Wound measurements about the same. Drainage was through his soft cast. We are changing this twice a week Tuesdays and Friday 10/25; comes in with moderate drainage. Still using Keystone silver alginate and a soft cast. Wound dimensions completely the same.He has a lot of edema in the left leg he has lymphedema. Asking for [name] to consider wrapping him as he cannot get his stocking on over the soft cast 11/2; comes in with moderate to large drainage slightly smaller in terms of width we have been using [place]. His wound looks satisfactory but not much improvement 11/4; patient presents today for obligatory cast change. Has no issues or complaints today. He denies signs of infection. 11/9; patient traveled this weekend to [place], was on the cast quite a bit. Staining of the cast with black material from his walking boot. Drainage was not quite as bad as we feared. Using silver alginate and Keystone 11/16; we do not have size for cast therefore we have been putting a soft cast on him since the change on Friday. Still a significant amount of drainage necessitating changing twice a week. We have been using the Keystone at cast changes either hard or soft as well as silver alginate Comes in the clinic with things actually looking fairly good improvement in width. He says his offloading is about the  same 02/24/2021 upon evaluation today patient actually comes back in and is doing excellent in regard to his foot ulcer this is significantly smaller even compared to the last visit. The soft cast seems to have done extremely well for him which is great news. I do not see any signs of infection minimal debridement will be needed today. 11/30; left lateral foot much improved half a centimeter improvement in surface area. No evidence of infection. He seems to be doing better with the soft cast in the TCC therefore we will continue with  this. He comes back in later in the week for a change with the nurses. This is due to drainage 12/6; no improvement in dimensions. Under illumination some debris on the surface we have been using silver alginate, soft cast. If there is anything optimistic here he seems to have have less drainage 12/13. Dimensions are improved both length and width and slightly in depth. Appears to be quite healthy today. Raised edges of this thick skin and callus around the edges however. He is in a soft cast were bringing him back once for a change on Friday. Drainage is better 12/20. Dimensions are improved. He still has raised edges of thick skin and callus around the edges. We are using a soft cast 12/28; comes in today with thick callus around the wound. Using silver under alginate under a soft cast. I do not think there is much improvement in any measurement 2023 04/06/2021; patient was put in a total contact cast. Unfortunately not much change in surface area Objective Constitutional Patient is hypertensive.. Pulse regular and within target range for patient.Marland Kitchen Respirations regular, non-labored and within target range.. Temperature is normal and within the target range for the patient.Marland Kitchen Appears in no distress. Vitals Time Taken: 12:45 PM, Height: 77 in, Weight: 280 lbs, BMI: 33.2, Temperature: 99.1 F, Pulse: 92 bpm, Respiratory Rate: 18 breaths/min, Blood Pressure: 140/95 mmHg,  Capillary Blood Glucose: 120 mg/dl. General Notes: glucose per pt report General Notes: Wound exam; no change in measurements. Thick callus and skin around the wound surface I used a #5 curette to remove thick skin and callus as well as some's fibrinous debris on the wound surface. Hemostasis with direct pressure Integumentary (Hair, Skin) Wound #3 status is Open. Original cause of wound was Trauma. The date acquired was: 10/02/2019. The wound has been in treatment 75 weeks. The wound is located on the Left,Lateral Foot. The wound measures 1.4cm length x 1.2cm width x 0.2cm depth; 1.319cm^2 area and 0.264cm^3 volume. There is Fat Layer (Subcutaneous Tissue) exposed. There is no tunneling or undermining noted. There is a medium amount of serosanguineous drainage noted. The wound margin is thickened. There is large (67-100%) pink granulation within the wound bed. There is no necrotic tissue within the wound bed. 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 There was a Excisional Skin/Subcutaneous Tissue Debridement with a total area of 1.68 sq cm performed by [name], MD. With the following instrument(s): Curette to remove Non-Viable tissue/material. Material removed includes Callus, Subcutaneous Tissue, and Skin: Epidermis. No specimens were taken. A time out was conducted at 13:06, prior to the start of the procedure. A Minimum amount of bleeding was controlled with Pressure. The procedure was tolerated well with a pain level of 0 throughout and a pain level of 0 following the procedure. Post Debridement Measurements: 1.4cm length x 1.2cm width x 0.2cm depth; 0.264cm^3  volume. Character of Wound/Ulcer Post Debridement is improved. Severity of Tissue Post Debridement is: Fat layer exposed. Post procedure Diagnosis Wound #3: Same as Pre-Procedure Pre-procedure diagnosis of Wound #3 is a Diabetic Wound/Ulcer of the Lower Extremity located on the Left,Lateral Foot . There was a T Contact Cast otal Procedure by [name], MD. Post procedure Diagnosis Wound #3:  Same as Pre-Procedure Plan Follow-up Appointments: Return Appointment in 1 week. - Dr. Dellia Nims Bathing/ Shower/ Hygiene: May shower with protection but do not get wound dressing(s) wet. Edema Control - Lymphedema / SCD / Other: Elevate legs to the level of the heart or above for 30 minutes daily and/or when sitting, a frequency of: - throughout the day Avoid standing for long periods of time. Exercise regularly Moisturize legs daily. - right leg every night before bed. Compression stocking or Garment 20-30 mm/Hg pressure to: - Apply to right leg in the morning and remove at night. Off-Loading: T Contact Cast to Left Lower Extremity otal Other: - minimal weight bearing left foot Additional Orders / Instructions: Follow Nutritious Diet WOUND #3: - Foot Wound Laterality: Left, Lateral Cleanser: Soap and Water 1 x Per Week/30 Days Discharge Instructions: May shower and wash wound with dial antibacterial soap and water prior to dressing change. Cleanser: Wound Cleanser (Generic) 1 x Per Week/30 Days Discharge Instructions: Cleanse the wound with wound cleanser prior to applying a clean dressing using gauze sponges, not tissue or cotton balls. Peri-Wound Care: Zinc Oxide Ointment 30g tube 1 x Per Week/30 Days Discharge Instructions: Apply Zinc Oxide to periwound with each dressing change as needed. Topical: keystone antibiotic compound 1 x Per Week/30 Days Discharge Instructions: thin layer to wound bed Prim Dressing: KerraCel Ag Gelling Fiber Dressing, 4x5 in (silver alginate) (Generic) 1 x Per  Week/30 Days ary Discharge Instructions: Apply silver alginate to wound bed as instructed Secondary Dressing: Zetuvit Plus 4x8 in 1 x Per Week/30 Days Discharge Instructions: Apply over primary dressing as directed. Com pression Wrap: Kerlix Roll 4.5x3.1 (in/yd) 1 x Per Week/30 Days Discharge Instructions: Apply Kerlix and Coban compression as directed. Com pression Wrap: Coban Self-Adherent Wrap 4x5 (in/yd) 1 x Per Week/30 Days Discharge Instructions: Apply over Kerlix as directed. 1. Still with silver alginate under the total contact cast 2. We are going to put a wrap on his left leg as well to control his lymphedema Electronic Signature(s) Signed: 04/06/2021 3:57:23 PM By: [name] MD Entered By: [name] on 04/06/2021 13:30:28 -------------------------------------------------------------------------------- Total Contact Cast Details Patient Name: Date of Service: [name] 04/06/2021 12:30 PM Medical Record Number: [id number] Patient Account Number: [id number] Date of Birth/Sex: Treating RN: 1985/06/23 (36 y.o. [name] Primary Care Provider: [name] Other Clinician: Referring Provider: Treating Provider/Extender: [name] in Treatment: 55 T Contact Cast Applied for Wound Assessment: otal Wound #3 Left,Lateral Foot Performed By: Physician [name], MD Post Procedure Diagnosis Same as Pre-procedure Electronic Signature(s) Signed: 04/06/2021 3:57:23 PM By: [name] MD Entered By: [name] on 04/06/2021 13:23:48 -------------------------------------------------------------------------------- SuperBill Details Patient Name: Date of Service: [name], [name] 04/06/2021 Medical Record Number: [id number] Patient Account Number: [id number] Date of Birth/Sex: Treating RN: 1985/05/29 (36 y.o. [name] Primary Care Provider: [name] Other Clinician: Referring Provider: Treating  Provider/Extender: [name] in Treatment: 75 Diagnosis Coding ICD-10 Codes Code Description E11.621 Type 2 diabetes mellitus with foot ulcer L97.528 Non-pressure chronic ulcer of other part of left foot with other specified severity Facility Procedures CPT4 Code: [id number] Description: 32549 - DEB SUBQ TISSUE 20 SQ CM/< ICD-10 Diagnosis Description L97.528 Non-pressure chronic ulcer of other part of left foot with other specified seve E11.621 Type 2 diabetes mellitus with foot ulcer Modifier: rity Quantity: 1 Physician Procedures : CPT4 Code Description Modifier [id number] - WC PHYS SUBQ TISS 20 SQ  CM ICD-10 Diagnosis Description L97.528 Non-pressure chronic ulcer of other part of left foot with other specified severity E11.621 Type 2 diabetes mellitus with foot ulcer Quantity: 1 Electronic Signature(s) Signed: 04/06/2021 3:57:23 PM By: [name] MD Entered By: [name] on 04/06/2021 13:30:45

## 2021-04-13 ENCOUNTER — Other Ambulatory Visit: Payer: Self-pay

## 2021-04-13 ENCOUNTER — Encounter (HOSPITAL_BASED_OUTPATIENT_CLINIC_OR_DEPARTMENT_OTHER): Payer: BC Managed Care – PPO | Admitting: Internal Medicine

## 2021-04-13 DIAGNOSIS — E11621 Type 2 diabetes mellitus with foot ulcer: Secondary | ICD-10-CM | POA: Diagnosis not present

## 2021-04-13 NOTE — Progress Notes (Signed)
[name], [name] ([id number]) Visit Report for 04/13/2021 Arrival Information Details Patient Name: Date of Service: [name] 04/13/2021 7:30 A M Medical Record Number: [id number] Patient Account Number: [id number] Date of Birth/Sex: Treating RN: 06-15-1985 (36 y.o. [name] Primary Care Minha Fulco: [name] Other Clinician: Referring Aizik Reh: Treating Osaze Hubbert/Extender: [name] in Treatment: 61 Visit Information History Since Last Visit Added or deleted any medications: No Patient Arrived: Ambulatory Any new allergies or adverse reactions: No Arrival Time: 07:51 Had a fall or experienced change in No Transfer Assistance: None activities of daily living that may affect Patient Identification Verified: Yes risk of falls: Secondary Verification Process Completed: Yes Signs or symptoms of abuse/neglect since last visito No Patient Requires Transmission-Based Precautions: No Hospitalized since last visit: No Patient Has Alerts: No Implantable device outside of the clinic excluding No cellular tissue based products placed in the center since last visit: Has Dressing in Place as Prescribed: Yes Has Footwear/Offloading in Place as Prescribed: Yes Left: T Contact Cast otal Pain Present Now: No Electronic Signature(s) Signed: 04/13/2021 4:43:10 PM By: [name] Entered By: [name] on 04/13/2021 07:52:27 -------------------------------------------------------------------------------- Encounter Discharge Information Details Patient Name: Date of Service: [name], [name] 04/13/2021 7:30 A M Medical Record Number: [id number] Patient Account Number: [id number] Date of Birth/Sex: Treating RN: 1985/10/03 (36 y.o. [name] Primary Care Vivia Rosenburg: [name] Other Clinician: Referring Sloane Junkin: Treating Flavius Repsher/Extender: [name] in Treatment: 14 Encounter Discharge Information  Items Discharge Condition: Stable Ambulatory Status: Ambulatory Discharge Destination: Home Transportation: Private Auto Schedule Follow-up Appointment: Yes Clinical Summary of Care: Provided on 04/13/2021 Form Type Recipient Paper Patient Patient Electronic Signature(s) Signed: 04/13/2021 8:35:11 AM By: [name] Entered By: [name] on 04/13/2021 08:35:11 -------------------------------------------------------------------------------- Lower Extremity Assessment Details Patient Name: Date of Service: [name] 04/13/2021 7:30 A M Medical Record Number: [id number] Patient Account Number: [id number] Date of Birth/Sex: Treating RN: 03-17-1986 (36 y.o. [name] Primary Care Mescal Flinchbaugh: [name] Other Clinician: Referring Soliyana Mcchristian: Treating Telly Jawad/Extender: [name] in Treatment: 76 Edema Assessment Assessed: [name]: Yes] [name]: No] Edema: [Left: Ye] [Right: s] Calf Left: Right: Point of Measurement: 48 cm From Medial Instep 47 cm Ankle Left: Right: Point of Measurement: 11 cm From Medial Instep 29 cm Vascular Assessment Pulses: Dorsalis Pedis Palpable: [Left:Yes] Electronic Signature(s) Signed: 04/13/2021 4:43:10 PM By: [name] Entered By: [name] on 04/13/2021 07:58:39 -------------------------------------------------------------------------------- Multi Wound Chart Details Patient Name: Date of Service: [name], [name] 04/13/2021 7:30 A M Medical Record Number: [id number] Patient Account Number: [id number] Date of Birth/Sex: Treating RN: 31-Jan-1986 (36 y.o. [name] Primary Care Aws Shere: [name] Other Clinician: Referring Naylah Cork: Treating Genean Adamski/Extender: [name] in Treatment: 76 Vital Signs Height(in): 77 Capillary Blood Glucose(mg/dl): 85 Weight(lbs): 280 Pulse(bpm): 41 Body Mass Index(BMI): 33 Blood Pressure(mmHg):  140/86 Temperature(F): 98.5 Respiratory Rate(breaths/min): 16 Photos: [3:Left, Lateral Foot] [N/A:N/A N/A] Wound Location: [3:Trauma] [N/A:N/A] Wounding Event: [3:Diabetic Wound/Ulcer of the Lower] [N/A:N/A] Primary Etiology: [3:Extremity Type II Diabetes] [N/A:N/A] Comorbid History: [3:10/02/2019] [N/A:N/A] Date Acquired: [3:76] [N/A:N/A] Weeks of Treatment: [3:Open] [N/A:N/A] Wound Status: [3:1.2x1.3x0.3] [N/A:N/A] Measurements L x W x [name] [3:1.225] [N/A:N/A] A (cm) : rea [3:0.368] [N/A:N/A] Volume (cm) : [3:25.70%] [N/A:N/A] % Reduction in A rea: [3:-123.00%] [N/A:N/A] % Reduction in Volume: [3:Grade 2] [N/A:N/A] Classification: [3:Medium] [N/A:N/A] Exudate A mount: [3:Serosanguineous] [N/A:N/A] Exudate Type: [3:red, brown] [N/A:N/A] Exudate Color: [3:Thickened] [N/A:N/A] Wound Margin: [  3:Large (67-100%)] [N/A:N/A] Granulation A mount: [3:Red, Pink] [N/A:N/A] Granulation Quality: [3:None Present (0%)] [N/A:N/A] Necrotic A mount: [3:Fat Layer (Subcutaneous Tissue): Yes N/A] Exposed Structures: [3:Fascia: No Tendon: No Muscle: No Joint: No Bone: No Medium (34-66%)] [N/A:N/A] Epithelialization: [3:Calloused periwound] [N/A:N/A] Assessment Notes: [3:T Contact Cast otal] [N/A:N/A] Treatment Notes Electronic Signature(s) Signed: 04/13/2021 4:28:08 PM By: [name] MD Signed: 04/13/2021 5:00:37 PM By: [name] RN, BSN Entered By: [name] on 04/13/2021 08:16:02 -------------------------------------------------------------------------------- Multi-Disciplinary Care Plan Details Patient Name: Date of Service: [name], [name] 04/13/2021 7:30 A M Medical Record Number: [id number] Patient Account Number: [id number] Date of Birth/Sex: Treating RN: Feb 18, 1986 (36 y.o. [name] Primary Care Laneka Mcgrory: [name] Other Clinician: Referring Josclyn Rosales: Treating Gery Sabedra/Extender: [name] in Treatment:  34 Multidisciplinary Care Plan reviewed with physician Active Inactive Nutrition Nursing Diagnoses: Imbalanced nutrition Potential for alteratiion in Nutrition/Potential for imbalanced nutrition Goals: Patient/caregiver agrees to and verbalizes understanding of need to use nutritional supplements and/or vitamins as prescribed Date Initiated: 10/24/2019 Date Inactivated: 04/06/2020 Target Resolution Date: 04/03/2020 Goal Status: Met Patient/caregiver will maintain therapeutic glucose control Date Initiated: 10/24/2019 Target Resolution Date: 04/30/2021 Goal Status: Active Interventions: Assess HgA1c results as ordered upon admission and as needed Assess patient nutrition upon admission and as needed per policy Provide education on elevated blood sugars and impact on wound healing Provide education on nutrition Treatment Activities: Education provided on Nutrition : 12/02/2020 Notes: 11/17/20: Glucose control ongoing issue, target date extended. 01/26/21: Glucose management continues. Wound/Skin Impairment Nursing Diagnoses: Impaired tissue integrity Knowledge deficit related to ulceration/compromised skin integrity Goals: Patient/caregiver will verbalize understanding of skin care regimen Date Initiated: 10/24/2019 Target Resolution Date: 04/30/2021 Goal Status: Active Ulcer/skin breakdown will have a volume reduction of 30% by week 4 Date Initiated: 10/24/2019 Date Inactivated: 01/16/2020 Target Resolution Date: 01/10/2020 Unmet Reason: no change in Goal Status: Unmet measurements. Interventions: Assess patient/caregiver ability to obtain necessary supplies Assess patient/caregiver ability to perform ulcer/skin care regimen upon admission and as needed Assess ulceration(s) every visit Provide education on ulcer and skin care Notes: 11/17/20: Wound care regimen continues Electronic Signature(s) Signed: 04/13/2021 4:43:10 PM By: [name] Entered By: [name] on  04/13/2021 07:54:05 -------------------------------------------------------------------------------- Pain Assessment Details Patient Name: Date of Service: [name], [name] 04/13/2021 7:30 A M Medical Record Number: [id number] Patient Account Number: [id number] Date of Birth/Sex: Treating RN: 1985/09/03 (36 y.o. [name] Primary Care Anacleto Batterman: [name] Other Clinician: Referring Mylin Gignac: Treating Kiyo Heal/Extender: [name] in Treatment: 76 Active Problems Location of Pain Severity and Description of Pain Patient Has Paino No Site Locations Pain Management and Medication Current Pain Management: Electronic Signature(s) Signed: 04/13/2021 4:43:10 PM By: [name] Entered By: [name] on 04/13/2021 07:53:19 -------------------------------------------------------------------------------- Patient/Caregiver Education Details Patient Name: Date of Service: [name], [name] 1/10/2023andnbsp7:30 A M Medical Record Number: [id number] Patient Account Number: [id number] Date of Birth/Gender: Treating RN: [birth year] (36 y.o. [name] Primary Care Physician: [name] Other Clinician: Referring Physician: Treating Physician/Extender: [name] in Treatment: 26 Education Assessment Education Provided To: Patient Education Topics Provided Elevated Blood Sugar/ Impact on Healing: Methods: Explain/Verbal Responses: State content correctly Offloading: Methods: Explain/Verbal, Printed Responses: State content correctly Wound/Skin Impairment: Methods: Explain/Verbal, Printed Responses: State content correctly Electronic Signature(s) Signed: 04/13/2021 4:43:10 PM By: [name] Entered By: [name] on 04/13/2021 07:54:42 -------------------------------------------------------------------------------- Wound Assessment Details Patient Name: Date of Service: [name], [name]  04/13/2021 7:30  A M Medical Record Number: [id number] Patient Account Number: [id number] Date of Birth/Sex: Treating RN: 1985-06-05 (36 y.o. [name] Primary Care Robertlee Rogacki: [name] Other Clinician: Referring Baran Kuhrt: Treating Dorthie Santini/Extender: [name] in Treatment: 76 Wound Status Wound Number: 3 Primary Etiology: Diabetic Wound/Ulcer of the Lower Extremity Wound Location: Left, Lateral Foot Wound Status: Open Wounding Event: Trauma Comorbid History: Type II Diabetes Date Acquired: 10/02/2019 Weeks Of Treatment: 76 Clustered Wound: No Photos Wound Measurements Length: (cm) 1.2 Width: (cm) 1.3 Depth: (cm) 0.3 Area: (cm) 1.225 Volume: (cm) 0.368 % Reduction in Area: 25.7% % Reduction in Volume: -123% Epithelialization: Medium (34-66%) Tunneling: No Undermining: No Wound Description Classification: Grade 2 Wound Margin: Thickened Exudate Amount: Medium Exudate Type: Serosanguineous Exudate Color: red, brown Foul Odor After Cleansing: No Slough/Fibrino No Wound Bed Granulation Amount: Large (67-100%) Exposed Structure Granulation Quality: Red, Pink Fascia Exposed: No Necrotic Amount: None Present (0%) Fat Layer (Subcutaneous Tissue) Exposed: Yes Tendon Exposed: No Muscle Exposed: No Joint Exposed: No Bone Exposed: No Assessment Notes Calloused periwound Treatment Notes Wound #3 (Foot) Wound Laterality: Left, Lateral Cleanser Soap and Water Discharge Instruction: May shower and wash wound with dial antibacterial soap and water prior to dressing change. Wound Cleanser Discharge Instruction: Cleanse the wound with wound cleanser prior to applying a clean dressing using gauze sponges, not tissue or cotton balls. Peri-Wound Care Zinc Oxide Ointment 30g tube Discharge Instruction: Apply Zinc Oxide to periwound with each dressing change as needed. Topical keystone antibiotic compound Discharge Instruction: thin layer  to wound bed Primary Dressing KerraCel Ag Gelling Fiber Dressing, 4x5 in (silver alginate) Discharge Instruction: Apply silver alginate to wound bed as instructed Secondary Dressing Zetuvit Plus 4x8 in Discharge Instruction: Apply over primary dressing as directed. Secured With Compression Wrap Kerlix Roll 4.5x3.1 (in/yd) Discharge Instruction: Apply Kerlix and Coban compression as directed. Coban Self-Adherent Wrap 4x5 (in/yd) Discharge Instruction: Apply over Kerlix as directed. Compression Stockings Add-Ons Electronic Signature(s) Signed: 04/13/2021 3:01:20 PM By: Sandre Kitty Signed: 04/13/2021 4:43:10 PM By: [name] Entered By: Sandre Kitty on 04/13/2021 08:02:16 -------------------------------------------------------------------------------- Vitals Details Patient Name: Date of Service: [name], [name] 04/13/2021 7:30 A M Medical Record Number: [id number] Patient Account Number: [id number] Date of Birth/Sex: Treating RN: 1985/07/12 (36 y.o. [name] Primary Care Trinitie Mcgirr: [name] Other Clinician: Referring Salsabeel Gorelick: Treating Evens Meno/Extender: [name] in Treatment: 76 Vital Signs Time Taken: 07:52 Temperature (F): 98.5 Height (in): 77 Pulse (bpm): 85 Weight (lbs): 280 Respiratory Rate (breaths/min): 16 Body Mass Index (BMI): 33.2 Blood Pressure (mmHg): 140/86 Capillary Blood Glucose (mg/dl): 85 Reference Range: 80 - 120 mg / dl Electronic Signature(s) Signed: 04/13/2021 4:43:10 PM By: [name] Entered By: [name] on 04/13/2021 07:53:11

## 2021-04-13 NOTE — Progress Notes (Signed)
[place], [name] ([id number]) Visit Report for 04/13/2021 HPI Details Patient Name: Date of Service: [name] 04/13/2021 7:30 A M Medical Record Number: [id number] Patient Account Number: [id number] Date of Birth/Sex: Treating RN: 1985-05-17 (36 y.o. 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 He comes in for review of a wound over the plantar fifth metatarsal head extending into the lateral part of the foot. He was followed for this previously by his podiatrist Dr. Cornelius Moras. As the patient tells his story he went to see podiatry first for a swelling he developed on the lateral part of his fifth metatarsal head in May. He states this was "open" by podiatry and the area closed. He was followed up in June and it was again opened callus removed and it closed promptly. There were plans being made for surgery on the fifth metatarsal head in June however his blood sugar was apparently too high for anesthesia. Apparently the area was debrided and opened again in June and it is never closed since. Looking over the records from podiatry I am really not able to follow this. It was clear when he was first seen it was before 5/14 at that point he already had a wound. By 5/17 the ulcer was resolved. I do not see anything about a procedure. On 5/28 noted to have pre-ulcerative moderate keratosis. X-ray noted 1/5 contracted toe and tailor's bunion and metatarsal deformity. On a visit date on 09/28/2018 the dorsal part of the left foot it healed and resolved. There was concern about swelling in his lower extremity he was sent to the ER.. As far as I can tell he was seen in the ER on 7/12 with an ulcer on his left foot. A DVT rule  out of the left leg was negative. I do not think I have complete records from podiatry but I am not able to verify the procedures this patient states he had. He states after the last procedure the wound has never closed although I am not able to follow this in the records I have from podiatry. He has not had a recent x-ray The patient has been using Neosporin on the wound. He is wearing a Darco shoe. He is still very active up on his foot working and exercising. Past medical history; type 2 diabetes ketosis-prone, leg swelling with a negative DVT study in July. Non-smoker ABI in our clinic was 0.85 on the left 10/16; substantial wound on the plantar left fifth met head extending laterally almost to the dorsal fifth MTP. We have been using silver alginate we gave him a Darco forefoot off loader. An x-ray did not show evidence of osteomyelitis did note soft tissue emphysema which I think was due to gas tracking through an open wound. There is no doubt in my mind he requires an MRI 10/23; MRI not booked until 3 November at the earliest this is largely due to his glucose sensor in the right arm. We have been using silver alginate. There has been an improvement 10/29; I am still not exactly sure when his MRI is booked for. He says it is the third but it is the 10th in epic. This definitely needs to be done. He is running a low-grade fever today but no other symptoms. No real  improvement in the 1 02/26/2019 patient presents today for a follow-up visit here in our clinic he is last been seen in the clinic on October 29. Subsequently we were working on getting MRI to evaluate and see what exactly was going on and where we would need to go from the standpoint of whether or not he had osteomyelitis and again what treatments were going be required. Subsequently the patient ended up being admitted to the hospital on 02/07/2019 and was discharged on 02/14/2019. This is a somewhat interesting admission with a  discharge diagnosis of pneumonia due to COVID-19 although he was positive for COVID-19 when tested at the urgent care but negative x2 when he was actually in the hospital. With that being said he did have acute respiratory failure with hypoxia and it was noted he also have a left foot ulceration with osteomyelitis. With that being said he did require oxygen for his pneumonia and I level 4 L. He was placed on antivirals and steroids for the COVID-19. He was also transferred to the [facility] at one point. Nonetheless he did subsequently discharged home and since being home has done much better in that regard. The CT angiogram did not show any pulmonary embolism. With regard to the osteomyelitis the patient was placed on vancomycin and Zosyn while in the hospital but has been changed to Augmentin at discharge. It was also recommended that he follow- up with wound care and podiatry. Podiatry however wanted him to see [name] according to the patient prior to them doing anything further. His hemoglobin A1c was 9.9 as noted in the hospital. Have an MRI of the left foot performed while in the hospital on 02/04/2019. 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 There is an overlying plantar open wound noted an abscess tracking back along the lateral aspect of the fifth metatarsal shaft. There is otherwise diffuse cellulitis and mild fasciitis without findings of polymyositis. The patient did have recently pneumonia secondary to COVID-19 I looked in the chart through epic and it does appear that the patient may need to have an additional x-ray just to ensure everything is cleared and that he has no airspace disease prior to putting him into the [name]. 03/05/2019; patient was readmitted to the clinic last week. He was hospitalized twice for a viral upper respiratory tract infection from 11/1 through 11/4 and  then 11/5 through 11/12 ultimately this turned out to be Covid pneumonitis. Although he was discharged on oxygen he is not using it. He says he feels fine. He has no exercise limitation no cough no sputum. His O2 sat in our clinic today was 100% on room air. 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 He received Vanco and Zosyn in the hospital and then was discharged on 2 weeks of Augmentin. I do not see any relevant cultures. He was supposed to follow-up with infectious disease but I do not see that he has an appointment. 12/8; patient saw Dr. Novella Olive of infectious disease last week. He felt that he had had adequate antibiotic therapy. He did not go to follow-up with Dr. Amalia Hailey of podiatry and I have again talked to him about the pros and cons of this. He does not want to consider a ray amputation of this time. He is aware of the risks  of recurrence, migration etc. He started HBO today and tolerated this well. He can complete the Augmentin that I gave him last week. I have looked over the lab work that Dr. Chana Bode ordered his C-reactive protein was 3.3 and his sedimentation rate was 17. The C-reactive protein is never really been measurably that high in this patient 12/15; not much change in the wound today however he has undermining along the lateral part of the foot again more extensively than last week. He has some rims of epithelialization. We have been using silver alginate. He is undergoing hyperbarics but did not dive today 12/18; in for his obligatory first total contact cast change. Unfortunately there was pus coming from the undermining area around his fifth metatarsal head. This was cultured but will preclude reapplication of a cast. He is seen in conjunction with HBO 12/24; patient had staph lugdunensis in the wound in the undermining area laterally last time. We put him on  doxycycline which should have covered this. The wound looks better today. I am going to give him another week of doxycycline before reattempting the total contact cast 12/31; the patient is completing antibiotics. Hemorrhagic debris in the distal part of the wound with some undermining distally. He also had hyper granulation. Extensive debridement with a #5 curette. The infected area that was on the lateral part of the fifth met head is closed over. I do not think he needs any more antibiotics. Patient was seen prior to HBO. Preparations for a total contact cast were made in the cast will be placed post hyperbarics 04/11/19; once again the patient arrives today without complaint. He had been in a cast all week noted that he had heavy drainage this week. This resulted in large raised areas of macerated tissue around the wound 1/14; wound bed looks better slightly smaller. Hydrofera Blue has been changing himself. He had a heavy drainage last week which caused a lot of maceration around the wound so I took him out of a total contact cast he says the drainage is actually better this week He is seen today in conjunction with HBO 1/21; returns to clinic. He was up in [place] for a day or 2 attending a funeral. He comes back in with the wound larger and with a large area of exposed bone. He had osteomyelitis and septic arthritis of the fifth left metatarsal head while he was in hospital. He received IV antibiotics in the hospital for a prolonged period of time then 3 weeks of Augmentin. Subsequently I gave him 2 weeks of doxycycline for more superficial wound infection. When I saw this last week the wound was smaller the surface of the wound looks satisfactory. 1/28; patient missed hyperbarics today. Bone biopsy I did last time showed Enterococcus faecalis and Staphylococcus lugdunensis . He has a wide area of exposed bone. We are going to use silver alginate as of today. I had another ethical discussion  with the patient. 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 In this situation I think the likelihood of healing this is low. Therefore I have recommended a ray amputation and with the patient's agreement I have referred him to Dr. Doran Durand. The other issue is that his compliance with hyperbarics has been minimal because of his work schedule and given his underlying decision I am going to stop this today READMISSION 10/24/2019 MRI 09/29/2019 left foot IMPRESSION: 1. Apparent skin ulceration inferior and lateral to the 5th metatarsal base with underlying heterogeneous T2  signal and enhancement in the subcutaneous fat. Small peripherally enhancing fluid collections along the plantar and lateral aspects of the 5th metatarsal base suspicious for abscesses. 2. Interval amputation through the mid 5th metatarsal with nonspecific low-level marrow edema and enhancement. Given the proximity to the adjacent soft tissue inflammatory changes, osteomyelitis cannot be excluded. 3. The additional bones appear unremarkable. MRI 09/29/2019 right foot IMPRESSION: 1. Soft tissue ulceration lateral to the 5th MTP joint. There is low-level T2 hyperintensity within the 4th and 5th metatarsal heads and adjacent proximal phalanges without abnormal T1 signal or cortical destruction. These findings are nonspecific and could be seen with early marrow edema, hyperemia or early osteomyelitis. No evidence of septic joint. 2. Mild tenosynovitis and synovial enhancement associated with the extensor digitorum tendons at the level of the midfoot. 3. Diffuse low-level muscular T2 hyperintensity and enhancement, most consistent with diabetic myopathy. LEFT FOOT BONE Methicillin resistant staphylococcus aureus Staphylococcus lugdunensis MIC MIC CIPROFLOXACIN >=8 RESISTANT Resistant <=0.5 SENSI... Sensitive CLINDAMYCIN <=0.25 SENS... Sensitive >=8 RESISTANT Resistant ERYTHROMYCIN >=8  RESISTANT Resistant >=8 RESISTANT Resistant GENTAMICIN <=0.5 SENSI... Sensitive <=0.5 SENSI... Sensitive Inducible Clindamycin NEGATIVE Sensitive NEGATIVE Sensitive OXACILLIN >=4 RESISTANT Resistant 2 SENSITIVE Sensitive RIFAMPIN <=0.5 SENSI... Sensitive <=0.5 SENSI... Sensitive TETRACYCLINE <=1 SENSITIVE Sensitive <=1 SENSITIVE Sensitive TRIMETH/SULFA <=10 SENSIT Sensitive <=10 SENSIT Sensitive ... Marland Kitchen.. VANCOMYCIN 1 SENSITIVE Sensitive <=0.5 SENSI... Sensitive Right foot bone . Component 3 wk ago Specimen Description BONE Special Requests RIGHT 4 METATARSAL SAMPLE B Gram Stain NO WBC SEEN NO ORGANISMS SEEN Culture RARE METHICILLIN RESISTANT STAPHYLOCOCCUS AUREUS NO ANAEROBES ISOLATED Performed at [facility], [place] [address] [place], [place] [address] Report Status 10/08/2019 FINAL Organism ID, Bacteria METHICILLIN RESISTANT STAPHYLOCOCCUS AUREUS Resulting Agency [facility] Susceptibility Methicillin resistant staphylococcus aureus MIC CIPROFLOXACIN >=8 RESISTANT Resistant CLINDAMYCIN <=0.25 SENS... Sensitive ERYTHROMYCIN >=8 RESISTANT Resistant GENTAMICIN <=0.5 SENSI... Sensitive Inducible Clindamycin NEGATIVE Sensitive OXACILLIN >=4 RESISTANT Resistant RIFAMPIN <=0.5 SENSI... Sensitive TETRACYCLINE <=1 SENSITIVE Sensitive TRIMETH/SULFA <=10 SENSIT Sensitive ... VANCOMYCIN 1 SENSITIVE Sensitive This is a patient we had in clinic earlier this year with a wound over his left fifth metatarsal head. 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 Doran Durand on 05/16/2019. He seems to have developed problems with wounds on his bilateral feet in June 2021 although he may have had problems earlier than  this. He was in an urgent care with a right foot ulcer on 09/26/2019 and given a course of doxycycline. This was apparently after having trouble getting into see orthopedics. He was seen by podiatry on 09/28/2019 noted to have bilateral lower extremity ulcers including the left lateral fifth metatarsal base and the right subfifth met head. It was noted that had purulent drainage at that time. He required hospitalization from 6/20 through 7/2. This was because of worsening right foot wounds. He underwent bilateral operative incision and drainage and bone biopsies bilaterally. Culture results are listed above. He has been referred back to clinic by Dr. Jacqualyn Posey of podiatry. He is also followed by Dr. Megan Salon who saw him yesterday. He was discharged from hospital on Zyvox Flagyl and Levaquin and yesterday changed to doxycycline Flagyl and Levaquin. His inflammatory markers on 6/26 showed a sedimentation rate of 129 and a C-reactive protein of 5. This  is improved to 14 and 1.3 respectively. This would indicate improvement. ABIs in our clinic today were 1.23 on the right and 1.20 on the left 11/01/2019 on evaluation today patient appears to be doing fairly well in regard to the wounds on his feet at this point. Fortunately there is no signs of active infection at this time. No fevers, chills, nausea, vomiting, or diarrhea. He currently is seeing infectious disease and still under their care at this point. Subsequently he also has both wounds which she has not been using collagen on as he did not receive that in his packaging he did not call us and let us know that. Apparently that just was missed on the order. Nonetheless we will get that straightened out today. 8/9-Patient returns for bilateral foot wounds, using Prisma with hydrogel moistened dressings, and the wounds appear stable. Patient using surgical shoes, avoiding much pressure or weightbearing as much as possible 8/16; patient has bilateral foot  wounds. 1 on the right lateral foot proximally the other is on the left mid lateral foot. Both required debridement of callus and thick skin around the wounds. We have been using silver collagen 8/27; patient has bilateral lateral foot wounds. The area on the left substantially surrounded by callus and dry skin. This was removed from the wound edge. The underlying wound is small. The area on the right measured somewhat smaller today. 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 Unfortunately I did not update his antibiotics during today's visit. 9/10 I reviewed Dr. Hale Bogus last notes he felt he had completed antibiotics his inflammatory markers were reasonably well controlled. He has a small wound on the lateral left foot and a tiny area on the right which is just above closed. He is using Hydrofera Blue with border foam he has bilateral surgical shoes 9/24; 2 week f/u. doing well. right foot is closed. left foot still undermined. 10/14; right foot remains closed at the fifth met head. The area over the base of the left fifth metatarsal has a small open area but considerable undermining towards the plantar foot. Thick callus skin around this suggests an adequate pressure relief. We have talked about this. He says he is going to go back into his cam boot. I suggested a total contact cast he did not seem enamored with this suggestion 10/26; left foot base of the fifth metatarsal. Same condition as last time. He has skin over the area with an open wound however the skin is not adherent. He went to see Dr. Earleen Newport who did an x-ray and culture of his foot I have not reviewed the x-ray but the patient was not told anything. He is on doxycycline 11/11; since the patient was last here he was in the emergency room on 10/30 he was concerned about swelling in the left foot. They did not do any cultures or x-rays. They changed his antibiotics to  cephalexin. Previous culture showed group B strep. The cephalexin is appropriate as doxycycline has less than predictable coverage. Arrives in clinic today with swelling over this area under the wound. He also has a new wound on the right fifth metatarsal head 11/18; the patient has a difficult wound on the lateral aspect of the left fifth metatarsal head. The wound was almost ballotable last week I opened it slightly expecting to see purulence however there was just bleeding. I cultured this this was negative. X-ray unchanged. We are trying to get an  MRI but I am not sure were going to be able to get this through his insurance. He also has an area on the right lateral fifth metatarsal head this looks healthier 12/3; the patient finally got our MRI. Surprisingly this did not show osteomyelitis. I did show the soft tissue ulceration at the lateral plantar aspect of the fifth metatarsal base with a tiny residual 6 mm abscess overlying the superficial fascia I have tried to culture this area I have not been able to get this to grow anything. Nevertheless the protruding tissue looks aggravated. I suspect we should try to treat the underlying "abscess with broad-spectrum antibiotics. I am going to start him on Levaquin and Flagyl. He has much less edema in his legs and I am going to continue to wrap his legs and see him weekly 12/10. I started Levaquin and Flagyl on him last week. He just picked up the Flagyl apparently there was some delay. The worry is the wound on the left fifth metatarsal base which is substantial and worsening. His foot looks like he inverts at the ankle making this a weightbearing surface. Certainly no improvement in fact I think the measurements of this are somewhat worse. We have been using 12/17; he apparently just got the Levaquin yesterday this is 2 weeks after the fact. He has completed the Flagyl. The area over the left fifth metatarsal base still has protruding granulation  tissue although it does not look quite as bad as it did some weeks ago. He has severe bilateral lymphedema although we have not been treating him for wounds on his legs this is definitely going to require compression. There was so much edema in the left I did not wish to put him in a total contact cast today. I am going to increase his compression from 3-4 layer. The area on the right lateral fifth met head actually look quite good and superficial. 12/23; patient arrived with callus on the right fifth met head and the substantial hyper granulated callused wound on the base of his fifth metatarsal. He says he is completing his Levaquin in 2 days but I do not think that adds up with what I gave him but I will have to double check this. We are using Hydrofera Blue on both areas. My plan is to put the left leg in a cast the week after New Year's 04/06/2020; patient's wounds about the same. Right lateral fifth metatarsal head and left lateral foot over the base of the fifth metatarsal. There is undermining on the left lateral foot which I removed before application of total contact cast continuing with Hydrofera Blue new. Patient tells me he was seen by endocrinology today lab work was done [Dr. Kerr]. Also wondering whether he was referred to cardiology. I went over some lab work from previously does not have chronic renal failure certainly not nephrotic range proteinuria he does have very poorly controlled diabetes but this is not his most updated lab work. Hemoglobin A1c has been over 11 1/10; the patient had a considerable amount of leakage towards mid part of his left foot with macerated skin however the wound surface looks better the area on the right lateral fifth met head is better as well. I am going to change the dressing on the left foot under the total contact cast to silver alginate, continue with Hydrofera Blue on the right. 1/20; patient was in the total contact cast for 10 days. Considerable  amount of drainage although the skin  around the wound does not look too bad on the left foot. The area on the right fifth metatarsal head is closed. Our nursing staff reports large amount of drainage out of the left lateral foot wound 1/25; continues with copious amounts of drainage described by our intake staff. PCR culture I did last week showed E. coli and Enterococcus faecalis and low quantities. Multiple resistance genes documented including extended spectrum beta lactamase, MRSA, MRSE, quinolone, tetracycline. The wound is not quite as good this week as it was 5 days ago but about the same size 2/3; continues with copious amounts of malodorous drainage per our intake nurse. The PCR culture I did 2 weeks ago showed E. coli and low quantities of Enterococcus. There were multiple resistance genes detected. I put Neosporin on him last week although this does not seem to have helped. The wound is slightly deeper today. Offloading continues to be an issue here although with the amount of drainage she has a total contact cast is just not going to work 2/10; moderate amount of drainage. Patient reports he cannot get his stocking on over the dressing. I told him we have to do that the nurse gave him suggestions on how to make this work. The wound is on the bottom and lateral part of his left foot. Is cultured predominantly grew low amounts of Enterococcus, E. coli and anaerobes. There were multiple resistance genes detected including extended spectrum beta lactamase, quinolone, tetracycline. I could not think of an easy oral combination to address this so for now I am going to do topical antibiotics provided by [facility] I think the main agents here are vancomycin and an aminoglycoside. We have to be able to give him access to the wounds to get the topical antibiotic on 2/17; moderate amount of drainage this is unchanged. He has his Keystone topical antibiotic against the deep tissue culture organisms. He  has been using this and changing the dressing daily. Silver alginate on the wound surface. 2/24; using Keystone antibiotic with silver alginate on the top. He had too much drainage for a total contact cast at one point although I think that is improving and I think in the next week or 2 it might be possible to replace a total contact cast I did not do this today. In general the wound surface looks healthy however he continues to have thick rims of skin and subcutaneous tissue around the wide area of the circumference which I debrided 06/04/2020 upon evaluation today patient appears to be doing well in regard to his wound. I do feel like he is showing signs of improvement. There is little bit of callus and dead tissue around the edges of the wound as well as what appears to be a little bit of a sinus tract that is off to the side laterally I would perform debridement to clear that away today. 3/17; left lateral foot. The wound looks about the same as I remember. Not much depth surface looks healthy. No evidence of infection 3/25; left lateral foot. Wound surface looks about the same. Separating epithelium from the circumference. There really is no evidence of infection here however not making progress by my view 3/29; left lateral foot. Surface of the wound again looks reasonably healthy still thick skin and subcutaneous tissue around the wound margins. There is no evidence of infection. One of the concerns being brought up by the nurses has again the amount of drainage vis--vis continued use of a total contact cast 4/5;  left lateral foot at roughly the base of the fifth metatarsal. Nice healthy looking granulated tissue with rims of epithelialization. The overall wound measurements are not any better but the tissue looks healthy. The only concern is the amount of drainage although he has no surrounding maceration with what we have been doing recently to absorb fluid and protect his skin. He also has  lymphedema. 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 He states that the kerlix/coban under the TCC rolled down and could not get it back up. He has been using an offloading scooter and has somehow hurt his right foot using this device. This happened last week. He states that the side of his right foot developed a blister and opened. The top of his foot also has a few small open wounds he thinks is due to his socks rubbing in his shoes. He has not been using any dressings to the wound. He denies purulent drainage, fever/chills or erythema to the wounds. 4/22; patient presents for 1 week follow-up. He developed new wounds to the right foot that were evaluated at last clinic visit. He continues to have a total contact cast to the left leg and he reports no issues. He has been using silver collagen to the right foot wounds with no issues. He denies purulent drainage, fever/chills or erythema to the right foot wounds. He has no complaints today 4/25; patient presents for 1 week follow-up. He has a total contact cast of the left leg and reports no issues. He has been using silver alginate to the right foot wound. He denies purulent drainage, fever/chills or erythema to the right foot wounds. 5/2 patient presents for 1 week follow-up. T contact cast on the left. The wound which is on the base of the plantar foot at the base of the fifth metatarsal otal actually looks quite good and dimensions continue to gradually contract. HOWEVER the area on the right lateral fifth metatarsal head is much larger than what I remember from 2 weeks ago. Once more is he has significant levels of hypergranulation. Noteworthy that he had this same hyper granulated response on his  wound on the left foot at one point in time. So much so that he I thought there was an underlying fluid collection. Based on this I think this just needs debridement. 5/9; the wound on the left actually continues to be gradually smaller with a healthy surface. Slight amount of drainage and maceration of the skin around but not too bad. However he has a large wound over the right fifth metatarsal head very much in the same configuration as his left foot wound was initially. I used silver nitrate to address the hyper granulated tissue no mechanical debridement 5/16; area on the left foot did not look as healthy this week deeper thick surrounding macerated skin and subcutaneous tissue. The area on the right foot fifth met head was about the same The area on the right ankle that we identified last week is completely broken down into an open wound presumably a stocking rubbing issue 5/23; patient has been using a total contact cast to the left side. He has been using silver alginate underneath. He has also been using silver alginate to the right  foot wounds. He has no complaints today. He denies any signs of infection. 5/31; the left-sided wound looks some better measure smaller surface granulation looks better. We have been using silver alginate under the total contact cast The large area on his right fifth met head and right dorsal foot look about the same still using silver alginate 6/6; neither side is good as I was hoping although the surface area dimensions are better. A lot of maceration on his left and right foot around the wound edge. Area on the dorsal right foot looks better. He says he was traveling. I am not sure what does the amount of maceration around the plantar wounds may be drainage issues 6/13; in general the wound surfaces look quite good on both sides. Macerated skin and raised edges around the wound required debridement although in general especially on the left the surface area  seems improved. The area on the right dorsal ankle is about the same I thought this would not be such a problem to close 6/20; not much change in either wound although the one on the right looks a little better. Both wounds have thick macerated edges to the skin requiring debridements. We have been using silver alginate. The area on the dorsal right ankle is still open I thought this would be closed. 6/28; patient comes in today with a marked deterioration in the right foot wound fifth met head. Wide area of exposed bone this is a drastic change from last time. The area on the left there we have been casting is stagnant. We have been using silver alginate in both wound areas. 7/5; bone culture I did for PCR last time was positive for Pseudomonas, group B strep, Enterococcus and Staph aureus. There was no suggestion of methicillin resistance or ampicillin resistant genes. This was resistant to tetracycline however He comes into the clinic today with the area over his right plantar fifth metatarsal head which had been doing so well 2 weeks ago completely necrotic feeling bone. I do not know that this is going to be salvageable. The left foot wound is certainly no smaller but it has a better surface and is superficial. 7/8; patient called in this morning to say that his total contact cast was rubbing against his foot. He states he is doing fine overall. He denies signs of infection. 7/12; continued deterioration in the wound over the right fifth metatarsal head crumbling bone. This is not going to be salvageable. The patient agrees and wants to be referred to Dr. Doran Durand which we will attempt to arrange as soon as possible. I am going to continue him on antibiotics as long as that takes so I will renew those today. The area on the left foot which is the base of the fifth metatarsal continues to look somewhat better. Healthy looking tissue no depth no debridement is necessary here. 7/20; the patient was  kindly seen by Dr. Doran Durand of orthopedics on 10/19/2020. 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 Towards this end we have taken him out of the total contact cast on the left we will put him in a wrap with Hydrofera Blue. As I understand things surgery is planned for 7/21 7/27; patient had his surgery last Thursday. 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 Apparently everything went well we did not still disturb that today The area on the left foot  actually looks quite good. He has been much less mobile which probably explains this he did not seem to do well in the total contact cast secondary to drainage and maceration I think. 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 Fortunately there is no evidence of active infection at this time. No fevers, chills, nausea, vomiting, or diarrhea. Overall I think that he is actually doing extremely well. Nonetheless I do believe that he is staying off of this more following the surgery in his right foot that is the reason the left is doing so great. 8/16; left plantar foot wound. This looks smaller than the last time I saw this he is using Hydrofera Blue. The surgical wound on the right foot is being followed by Dr. Doran Durand we did not look at this today. He has surgical shoes on both feet 8/23; left plantar foot wound not as good this week. Surrounding macerated skin and subcutaneous tissue everything looks moist and wet. I do not think he is offloading this adequately. He is using a surgical shoe Apparently the right foot surgical wound is not open although I did not check his foot 8/31; left plantar foot lateral aspect. Much improved this week. He has no maceration. Some improvement in the surface area of the wound but most impressively the depth is come in we  are using silver alginate. The patient is a Product/process development scientist. He is asked that we write him a letter so he can go back to work. I have also tried to see if we can write something that will allow him to limit the amount of time that he is on his foot at work. 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 Hopefully the latter can be avoided 9/6; I believe the patient missed an appointment last week. He arrives in today with a wound looking roughly the same certainly no better. Undermining laterally and also inferiorly. We used molecuLight today in training with the patient's permission.. We are using silver alginate 9/21 wound is measuring bigger this week although this may have to do with the aggressive circumferential debridement last week in response to the blush fluorescence on the MolecuLight. Culture I did last week showed significant MSSA and E. coli. I put him on Augmentin but he has not started it yet. We are also going to send this for compounded antibiotics at [facility]. There is no evidence of systemic infection 9/29; silver alginate. His Keystone arrived. He is completing Augmentin in 2 days. Offloading in a cam boot. Moderate drainage per our intake staff 10/5; using silver alginate. He has been using his Harmon. He has completed his Augmentin. Per our intake nurse still a lot of drainage, far too much to consider a total contact cast. Wound measures about the same. He had the same undermining area that I defined last week from a roughly 11-3. I remove this today 10/12; using silver alginate he is using the [employer]. He comes in for a nurse visit hence we are applying [employer] twice a week. Measuring slightly better today and less notable drainage. Extensive debridement of the wound edge last time 10/18; using topical Keystone and silver alginate and a soft cast. Wound measurements about the same. Drainage  was through his soft cast. We are changing this twice a week  Tuesdays and Friday 10/25; comes in with moderate drainage. Still using Keystone silver alginate and a soft cast. Wound dimensions completely the same.He has a lot of edema in the left leg he has lymphedema. Asking for [name] to consider wrapping him as he cannot get his stocking on over the soft cast 11/2; comes in with moderate to large drainage slightly smaller in terms of width we have been using [place]. His wound looks satisfactory but not much improvement 11/4; patient presents today for obligatory cast change. Has no issues or complaints today. He denies signs of infection. 11/9; patient traveled this weekend to [place], was on the cast quite a bit. Staining of the cast with black material from his walking boot. Drainage was not quite as bad as we feared. Using silver alginate and Keystone 11/16; we do not have size for cast therefore we have been putting a soft cast on him since the change on Friday. Still a significant amount of drainage necessitating changing twice a week. We have been using the Keystone at cast changes either hard or soft as well as silver alginate Comes in the clinic with things actually looking fairly good improvement in width. He says his offloading is about the same 02/24/2021 upon evaluation today patient actually comes back in and is doing excellent in regard to his foot ulcer this is significantly smaller even compared to the last visit. The soft cast seems to have done extremely well for him which is great news. I do not see any signs of infection minimal debridement will be needed today. 11/30; left lateral foot much improved half a centimeter improvement in surface area. No evidence of infection. He seems to be doing better with the soft cast in the TCC therefore we will continue with this. He comes back in later in the week for a change with the nurses. This is due to drainage 12/6; no improvement in  dimensions. Under illumination some debris on the surface we have been using silver alginate, soft cast. If there is anything optimistic here he seems to have have less drainage 12/13. Dimensions are improved both length and width and slightly in depth. Appears to be quite healthy today. Raised edges of this thick skin and callus around the edges however. He is in a soft cast were bringing him back once for a change on Friday. Drainage is better 12/20. Dimensions are improved. He still has raised edges of thick skin and callus around the edges. We are using a soft cast 12/28; comes in today with thick callus around the wound. Using silver under alginate under a soft cast. I do not think there is much improvement in any measurement 2023 04/06/2021; patient was put in a total contact cast. Unfortunately not much change in surface area 1/10; not much different still thick callus and skin around the edge in spite of the total contact cast. This was just debrided last week we have been using the [facility] compounded antibiotic and silver alginate under a total contact cast Electronic Signature(s) Signed: 04/13/2021 4:28:08 PM By: [name] MD Entered By: [name] on 04/13/2021 08:16:59 -------------------------------------------------------------------------------- Physical Exam Details Patient Name: Date of Service: [name], [name] 04/13/2021 7:30 A M Medical Record Number: [id number] Patient Account Number: [id number] Date of Birth/Sex: Treating RN: [birth year] (36 y.o. [name] Primary Care Provider: [name] Other Clinician: Referring Provider: Treating Provider/Extender: [name] in Treatment: 76 Constitutional Sitting or standing Blood Pressure is within target range  for patient.. Pulse regular and within target range for patient.Marland Kitchen Respirations regular, non-labored and within target range.. Temperature is normal and within the target  range for the patient.Marland Kitchen Appears in no distress. Notes Wound exam; no change in measurements. On the distal part of the wound surface there is a small [place] of epithelialization. Thick skin and callus around the margins of this continue in spite of the total contact cast. No evidence of infection Electronic Signature(s) Signed: 04/13/2021 4:28:08 PM By: [name] MD Entered By: [name] on 04/13/2021 08:17:57 -------------------------------------------------------------------------------- Physician Orders Details Patient Name: Date of Service: [name], [name] 04/13/2021 7:30 A M Medical Record Number: [id number] Patient Account Number: [id number] Date of Birth/Sex: Treating RN: 24-Jan-1986 (36 y.o. [name] Primary Care Provider: [name] Other Clinician: Referring Provider: Treating Provider/Extender: [name] in Treatment: 36 Verbal / Phone Orders: No Diagnosis Coding ICD-10 Coding Code Description E11.621 Type 2 diabetes mellitus with foot ulcer L97.528 Non-pressure chronic ulcer of other part of left foot with other specified severity Follow-up Appointments ppointment in 1 week. - Dr. Dellia Nims Return A Bathing/ Shower/ Hygiene May shower with protection but do not get wound dressing(s) wet. - Use Cast Protector Bag Edema Control - Lymphedema / SCD / Other Bilateral Lower Extremities Elevate legs to the level of the heart or above for 30 minutes daily and/or when sitting, a frequency of: - throughout the day Avoid standing for long periods of time. Exercise regularly Moisturize legs daily. - right leg every night before bed. Compression stocking or Garment 20-30 mm/Hg pressure to: - Apply to right leg in the morning and remove at night. Off-Loading Total Contact Cast to Left Lower Extremity Other: - minimal weight bearing left foot Additional Orders / Instructions Follow Nutritious Diet Wound Treatment Wound #3 -  Foot Wound Laterality: Left, Lateral Cleanser: Soap and Water 1 x Per Week/30 Days Discharge Instructions: May shower and wash wound with dial antibacterial soap and water prior to dressing change. Cleanser: Wound Cleanser (Generic) 1 x Per Week/30 Days Discharge Instructions: Cleanse the wound with wound cleanser prior to applying a clean dressing using gauze sponges, not tissue or cotton balls. Peri-Wound Care: Zinc Oxide Ointment 30g tube 1 x Per Week/30 Days Discharge Instructions: Apply Zinc Oxide to periwound with each dressing change as needed. Topical: keystone antibiotic compound 1 x Per Week/30 Days Discharge Instructions: thin layer to wound bed Prim Dressing: KerraCel Ag Gelling Fiber Dressing, 4x5 in (silver alginate) (Generic) 1 x Per Week/30 Days ary Discharge Instructions: Apply silver alginate to wound bed as instructed Secondary Dressing: Zetuvit Plus 4x8 in 1 x Per Week/30 Days Discharge Instructions: Apply over primary dressing as directed. Compression Wrap: Kerlix Roll 4.5x3.1 (in/yd) 1 x Per Week/30 Days Discharge Instructions: Apply Kerlix and Coban compression as directed. Compression Wrap: Coban Self-Adherent Wrap 4x5 (in/yd) 1 x Per Week/30 Days Discharge Instructions: Apply over Kerlix as directed. Electronic Signature(s) Signed: 04/13/2021 4:28:08 PM By: [name] MD Signed: 04/13/2021 4:43:10 PM By: [name] Entered By: [name] on 04/13/2021 08:12:16 -------------------------------------------------------------------------------- Problem List Details Patient Name: Date of Service: [name], [name] 04/13/2021 7:30 A M Medical Record Number: [id number] Patient Account Number: [id number] Date of Birth/Sex: Treating RN: 07/10/1985 (36 y.o. [name] Primary Care Provider: [name] Other Clinician: Referring Provider: Treating Provider/Extender: [name] in Treatment: 76 Active  Problems ICD-10 Encounter Code Description Active Date MDM Diagnosis E11.621 Type 2 diabetes mellitus with  foot ulcer 10/24/2019 No Yes L97.528 Non-pressure chronic ulcer of other part of left foot with other specified 10/24/2019 No Yes severity Inactive Problems ICD-10 Code Description Active Date Inactive Date L97.518 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ity 10/24/2019 10/24/2019 L97.518 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ity 07/14/2020 07/14/2020 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, right ankle and foot 10/24/2019 10/24/2019 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 08/10/2020 08/10/2020 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 He comes in for review of a wound over the plantar fifth metatarsal head extending into the lateral part of the foot. He was followed for this previously by his podiatrist Dr. Cornelius Moras. As the patient tells his story he  went to see podiatry first for a swelling he developed on the lateral part of his fifth metatarsal head in May. He states this was "open" by podiatry and the area closed. He was followed up in June and it was again opened callus removed and it closed promptly. There were plans being made for surgery on the fifth metatarsal head in June however his blood sugar was apparently too high for anesthesia. Apparently the area was debrided and opened again in June and it is never closed since. Looking over the records from podiatry I am really not able to follow this. It was clear when he was first seen it was before 5/14 at that point he already had a wound. By 5/17 the ulcer was resolved. I do not see anything about a procedure. On 5/28 noted to have pre-ulcerative moderate keratosis. X-ray noted 1/5 contracted toe and tailor's bunion and metatarsal deformity. On a visit date on 09/28/2018 the dorsal part of the left foot it healed and resolved. There was concern about swelling in his lower extremity he was sent to the ER.. As far as I can tell he was seen in the ER on 7/12 with an ulcer on his left foot. A DVT rule out of the left leg was negative. I do not think I have complete records from podiatry but I am not able to verify the procedures this patient states he had. He states after the last procedure the wound has never closed although I am not able to follow this in the records I have from podiatry. He has not had a recent x-ray The patient has been using Neosporin on the wound. He is wearing a Darco shoe. He is still very active up on his foot working and exercising. Past medical history; type 2 diabetes ketosis-prone, leg swelling with a negative DVT study in July. Non-smoker ABI in our clinic was 0.85 on the left 10/16; substantial wound on the plantar left fifth met head extending laterally almost to the dorsal fifth MTP. We have  been using silver alginate we gave him a Darco forefoot off loader. An  x-ray did not show evidence of osteomyelitis did note soft tissue emphysema which I think was due to gas tracking through an open wound. There is no doubt in my mind he requires an MRI 10/23; MRI not booked until 3 November at the earliest this is largely due to his glucose sensor in the right arm. We have been using silver alginate. There has been an improvement 10/29; I am still not exactly sure when his MRI is booked for. He says it is the third but it is the 10th in epic. This definitely needs to be done. He is running a low-grade fever today but no other symptoms. No real improvement in the 1 02/26/2019 patient presents today for a follow-up visit here in our clinic he is last been seen in the clinic on October 29. Subsequently we were working on getting MRI to evaluate and see what exactly was going on and where we would need to go from the standpoint of whether or not he had osteomyelitis and again what treatments were going be required. Subsequently the patient ended up being admitted to the hospital on 02/07/2019 and was discharged on 02/14/2019. This is a somewhat interesting admission with a discharge diagnosis of pneumonia due to COVID-19 although he was positive for COVID-19 when tested at the urgent care but negative x2 when he was actually in the hospital. With that being said he did have acute respiratory failure with hypoxia and it was noted he also have a left foot ulceration with osteomyelitis. With that being said he did require oxygen for his pneumonia and I level 4 L. He was placed on antivirals and steroids for the COVID-19. He was also transferred to the [facility] at one point. Nonetheless he did subsequently discharged home and since being home has done much better in that regard. The CT angiogram did not show any pulmonary embolism. With regard to the osteomyelitis the patient was placed on vancomycin and Zosyn while in the hospital but has been changed to Augmentin  at discharge. It was also recommended that he follow- up with wound care and podiatry. Podiatry however wanted him to see [name] according to the patient prior to them doing anything further. His hemoglobin A1c was 9.9 as noted in the hospital. Have an MRI of the left foot performed while in the hospital on 02/04/2019. 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 There is an overlying plantar open wound noted an abscess tracking back along the lateral aspect of the fifth metatarsal shaft. There is otherwise diffuse cellulitis and mild fasciitis without findings of polymyositis. The patient did have recently pneumonia secondary to COVID-19 I looked in the chart through epic and it does appear that the patient may need to have an additional x-ray just to ensure everything is cleared and that he has no airspace disease prior to putting him into the [name]. 03/05/2019; patient was readmitted to the clinic last week. He was hospitalized twice for a viral upper respiratory tract infection from 11/1 through 11/4 and then 11/5 through 11/12 ultimately this turned out to be Covid pneumonitis. Although he was discharged on oxygen he is not using it. He says he feels fine. He has no exercise limitation no cough no sputum. His O2 sat in our clinic today was 100% on room air. He did manage to  have his MRI which showed septic arthritis at the fifth MTP joint and osteomyelitis involving the fifth metatarsal head and proximal phalanx. He received Vanco and Zosyn in the hospital and then was discharged on 2 weeks of Augmentin. I do not see any relevant cultures. He was supposed to follow-up with infectious disease but I do not see that he has an appointment. 12/8; patient saw Dr. Novella Olive of infectious disease last week. He felt that he had had adequate antibiotic therapy. He did not go to follow-up with Dr. Amalia Hailey of podiatry and I  have again talked to him about the pros and cons of this. He does not want to consider a ray amputation of this time. He is aware of the risks of recurrence, migration etc. He started HBO today and tolerated this well. He can complete the Augmentin that I gave him last week. I have looked over the lab work that Dr. Chana Bode ordered his C-reactive protein was 3.3 and his sedimentation rate was 17. The C-reactive protein is never really been measurably that high in this patient 12/15; not much change in the wound today however he has undermining along the lateral part of the foot again more extensively than last week. He has some rims of epithelialization. We have been using silver alginate. He is undergoing hyperbarics but did not dive today 12/18; in for his obligatory first total contact cast change. Unfortunately there was pus coming from the undermining area around his fifth metatarsal head. This was cultured but will preclude reapplication of a cast. He is seen in conjunction with HBO 12/24; patient had staph lugdunensis in the wound in the undermining area laterally last time. We put him on doxycycline which should have covered this. The wound looks better today. I am going to give him another week of doxycycline before reattempting the total contact cast 12/31; the patient is completing antibiotics. Hemorrhagic debris in the distal part of the wound with some undermining distally. He also had hyper granulation. Extensive debridement with a #5 curette. The infected area that was on the lateral part of the fifth met head is closed over. I do not think he needs any more antibiotics. Patient was seen prior to HBO. Preparations for a total contact cast were made in the cast will be placed post hyperbarics 04/11/19; once again the patient arrives today without complaint. He had been in a cast all week noted that he had heavy drainage this week. This resulted in large raised areas of macerated tissue  around the wound 1/14; wound bed looks better slightly smaller. Hydrofera Blue has been changing himself. He had a heavy drainage last week which caused a lot of maceration around the wound so I took him out of a total contact cast he says the drainage is actually better this week He is seen today in conjunction with HBO 1/21; returns to clinic. He was up in [place] for a day or 2 attending a funeral. He comes back in with the wound larger and with a large area of exposed bone. He had osteomyelitis and septic arthritis of the fifth left metatarsal head while he was in hospital. He received IV antibiotics in the hospital for a prolonged period of time then 3 weeks of Augmentin. Subsequently I gave him 2 weeks of doxycycline for more superficial wound infection. When I saw this last week the wound was smaller the surface of the wound looks satisfactory. 1/28; patient missed hyperbarics today. Bone biopsy I did last time showed  Enterococcus faecalis and Staphylococcus lugdunensis . He has a wide area of exposed bone. We are going to use silver alginate as of today. I had another ethical discussion with the patient. 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 In this situation I think the likelihood of healing this is low. Therefore I have recommended a ray amputation and with the patient's agreement I have referred him to Dr. Doran Durand. The other issue is that his compliance with hyperbarics has been minimal because of his work schedule and given his underlying decision I am going to stop this today READMISSION 10/24/2019 MRI 09/29/2019 left foot IMPRESSION: 1. Apparent skin ulceration inferior and lateral to the 5th metatarsal base with underlying heterogeneous T2 signal and enhancement in the subcutaneous fat. Small peripherally enhancing fluid collections along the plantar and lateral aspects of the 5th metatarsal base suspicious for abscesses. 2. Interval  amputation through the mid 5th metatarsal with nonspecific low-level marrow edema and enhancement. Given the proximity to the adjacent soft tissue inflammatory changes, osteomyelitis cannot be excluded. 3. The additional bones appear unremarkable. MRI 09/29/2019 right foot IMPRESSION: 1. Soft tissue ulceration lateral to the 5th MTP joint. There is low-level T2 hyperintensity within the 4th and 5th metatarsal heads and adjacent proximal phalanges without abnormal T1 signal or cortical destruction. These findings are nonspecific and could be seen with early marrow edema, hyperemia or early osteomyelitis. No evidence of septic joint. 2. Mild tenosynovitis and synovial enhancement associated with the extensor digitorum tendons at the level of the midfoot. 3. Diffuse low-level muscular T2 hyperintensity and enhancement, most consistent with diabetic myopathy. LEFT FOOT BONE Methicillin resistant staphylococcus aureus Staphylococcus lugdunensis MIC MIC CIPROFLOXACIN >=8 RESISTANT Resistant <=0.5 SENSI... Sensitive CLINDAMYCIN <=0.25 SENS... Sensitive >=8 RESISTANT Resistant ERYTHROMYCIN >=8 RESISTANT Resistant >=8 RESISTANT Resistant GENTAMICIN <=0.5 SENSI... Sensitive <=0.5 SENSI... Sensitive Inducible Clindamycin NEGATIVE Sensitive NEGATIVE Sensitive OXACILLIN >=4 RESISTANT Resistant 2 SENSITIVE Sensitive RIFAMPIN <=0.5 SENSI... Sensitive <=0.5 SENSI... Sensitive TETRACYCLINE <=1 SENSITIVE Sensitive <=1 SENSITIVE Sensitive TRIMETH/SULFA <=10 SENSIT Sensitive <=10 SENSIT Sensitive ... Marland Kitchen.. VANCOMYCIN 1 SENSITIVE Sensitive <=0.5 SENSI... Sensitive Right foot bone . Component 3 wk ago Specimen Description BONE Special Requests RIGHT 4 METATARSAL SAMPLE B Gram Stain NO WBC SEEN NO ORGANISMS SEEN Culture RARE METHICILLIN RESISTANT STAPHYLOCOCCUS AUREUS NO ANAEROBES ISOLATED Performed at [facility], [place] [address] [place], [place] [address] Report Status 10/08/2019  FINAL Organism ID, Bacteria METHICILLIN RESISTANT STAPHYLOCOCCUS AUREUS Resulting Agency [facility] Susceptibility Methicillin resistant staphylococcus aureus MIC CIPROFLOXACIN >=8 RESISTANT Resistant CLINDAMYCIN <=0.25 SENS... Sensitive ERYTHROMYCIN >=8 RESISTANT Resistant GENTAMICIN <=0.5 SENSI... Sensitive Inducible Clindamycin NEGATIVE Sensitive OXACILLIN >=4 RESISTANT Resistant RIFAMPIN <=0.5 SENSI... Sensitive TETRACYCLINE <=1 SENSITIVE Sensitive TRIMETH/SULFA <=10 SENSIT Sensitive ... VANCOMYCIN 1 SENSITIVE Sensitive This is a patient we had in clinic earlier this year with a wound over his left fifth metatarsal head. 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 Doran Durand on 05/16/2019. He seems to have developed problems with wounds on his bilateral feet in June 2021 although he may have had problems earlier than this. He was in an urgent care with a right foot ulcer on 09/26/2019 and given a course of doxycycline. This was apparently after having trouble getting into see orthopedics. He  was seen by podiatry on 09/28/2019 noted to have bilateral lower extremity ulcers including the left lateral fifth metatarsal base and the right subfifth met head. It was noted that had purulent drainage at that time. He required hospitalization from 6/20 through 7/2. This was because of worsening right foot wounds. He underwent bilateral operative incision and drainage and bone biopsies bilaterally. Culture results are listed above. He has been referred back to clinic by Dr. Jacqualyn Posey of podiatry. He is also followed by Dr. Megan Salon who saw him yesterday. He was discharged from hospital on Zyvox Flagyl and Levaquin and yesterday  changed to doxycycline Flagyl and Levaquin. His inflammatory markers on 6/26 showed a sedimentation rate of 129 and a C-reactive protein of 5. This is improved to 14 and 1.3 respectively. This would indicate improvement. ABIs in our clinic today were 1.23 on the right and 1.20 on the left 11/01/2019 on evaluation today patient appears to be doing fairly well in regard to the wounds on his feet at this point. Fortunately there is no signs of active infection at this time. No fevers, chills, nausea, vomiting, or diarrhea. He currently is seeing infectious disease and still under their care at this point. Subsequently he also has both wounds which she has not been using collagen on as he did not receive that in his packaging he did not call us and let us know that. Apparently that just was missed on the order. Nonetheless we will get that straightened out today. 8/9-Patient returns for bilateral foot wounds, using Prisma with hydrogel moistened dressings, and the wounds appear stable. Patient using surgical shoes, avoiding much pressure or weightbearing as much as possible 8/16; patient has bilateral foot wounds. 1 on the right lateral foot proximally the other is on the left mid lateral foot. Both required debridement of callus and thick skin around the wounds. We have been using silver collagen 8/27; patient has bilateral lateral foot wounds. The area on the left substantially surrounded by callus and dry skin. This was removed from the wound edge. The underlying wound is small. The area on the right measured somewhat smaller today. 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 Unfortunately I did not update his antibiotics during today's visit. 9/10 I reviewed Dr. Hale Bogus last notes he felt he had completed antibiotics his inflammatory markers were reasonably well controlled. He has a small wound on the lateral left foot and a tiny  area on the right which is just above closed. He is using Hydrofera Blue with border foam he has bilateral surgical shoes 9/24; 2 week f/u. doing well. right foot is closed. left foot still undermined. 10/14; right foot remains closed at the fifth met head. The area over the base of the left fifth metatarsal has a small open area but considerable undermining towards the plantar foot. Thick callus skin around this suggests an adequate pressure relief. We have talked about this. He says he is going to go back into his cam boot. I suggested a total contact cast he did not seem enamored with this suggestion 10/26; left foot base of the fifth metatarsal. Same condition as last time. He has skin over the area with an open wound however the skin is not adherent. He went to see Dr. Earleen Newport who did an x-ray and culture of his foot I have not reviewed the x-ray but the patient was not told anything. He is on doxycycline 11/11; since the patient  was last here he was in the emergency room on 10/30 he was concerned about swelling in the left foot. They did not do any cultures or x-rays. They changed his antibiotics to cephalexin. Previous culture showed group B strep. The cephalexin is appropriate as doxycycline has less than predictable coverage. Arrives in clinic today with swelling over this area under the wound. He also has a new wound on the right fifth metatarsal head 11/18; the patient has a difficult wound on the lateral aspect of the left fifth metatarsal head. The wound was almost ballotable last week I opened it slightly expecting to see purulence however there was just bleeding. I cultured this this was negative. X-ray unchanged. We are trying to get an MRI but I am not sure were going to be able to get this through his insurance. He also has an area on the right lateral fifth metatarsal head this looks healthier 12/3; the patient finally got our MRI. Surprisingly this did not show osteomyelitis. I did  show the soft tissue ulceration at the lateral plantar aspect of the fifth metatarsal base with a tiny residual 6 mm abscess overlying the superficial fascia I have tried to culture this area I have not been able to get this to grow anything. Nevertheless the protruding tissue looks aggravated. I suspect we should try to treat the underlying "abscess with broad-spectrum antibiotics. I am going to start him on Levaquin and Flagyl. He has much less edema in his legs and I am going to continue to wrap his legs and see him weekly 12/10. I started Levaquin and Flagyl on him last week. He just picked up the Flagyl apparently there was some delay. The worry is the wound on the left fifth metatarsal base which is substantial and worsening. His foot looks like he inverts at the ankle making this a weightbearing surface. Certainly no improvement in fact I think the measurements of this are somewhat worse. We have been using 12/17; he apparently just got the Levaquin yesterday this is 2 weeks after the fact. He has completed the Flagyl. The area over the left fifth metatarsal base still has protruding granulation tissue although it does not look quite as bad as it did some weeks ago. He has severe bilateral lymphedema although we have not been treating him for wounds on his legs this is definitely going to require compression. There was so much edema in the left I did not wish to put him in a total contact cast today. I am going to increase his compression from 3-4 layer. The area on the right lateral fifth met head actually look quite good and superficial. 12/23; patient arrived with callus on the right fifth met head and the substantial hyper granulated callused wound on the base of his fifth metatarsal. He says he is completing his Levaquin in 2 days but I do not think that adds up with what I gave him but I will have to double check this. We are using Hydrofera Blue on both areas. My plan is to put the left  leg in a cast the week after New Year's 04/06/2020; patient's wounds about the same. Right lateral fifth metatarsal head and left lateral foot over the base of the fifth metatarsal. There is undermining on the left lateral foot which I removed before application of total contact cast continuing with Hydrofera Blue new. Patient tells me he was seen by endocrinology today lab work was done [Dr. Kerr]. Also wondering whether he was  referred to cardiology. I went over some lab work from previously does not have chronic renal failure certainly not nephrotic range proteinuria he does have very poorly controlled diabetes but this is not his most updated lab work. Hemoglobin A1c has been over 11 1/10; the patient had a considerable amount of leakage towards mid part of his left foot with macerated skin however the wound surface looks better the area on the right lateral fifth met head is better as well. I am going to change the dressing on the left foot under the total contact cast to silver alginate, continue with Hydrofera Blue on the right. 1/20; patient was in the total contact cast for 10 days. Considerable amount of drainage although the skin around the wound does not look too bad on the left foot. The area on the right fifth metatarsal head is closed. Our nursing staff reports large amount of drainage out of the left lateral foot wound 1/25; continues with copious amounts of drainage described by our intake staff. PCR culture I did last week showed E. coli and Enterococcus faecalis and low quantities. Multiple resistance genes documented including extended spectrum beta lactamase, MRSA, MRSE, quinolone, tetracycline. The wound is not quite as good this week as it was 5 days ago but about the same size 2/3; continues with copious amounts of malodorous drainage per our intake nurse. The PCR culture I did 2 weeks ago showed E. coli and low quantities of Enterococcus. There were multiple resistance genes  detected. I put Neosporin on him last week although this does not seem to have helped. The wound is slightly deeper today. Offloading continues to be an issue here although with the amount of drainage she has a total contact cast is just not going to work 2/10; moderate amount of drainage. Patient reports he cannot get his stocking on over the dressing. I told him we have to do that the nurse gave him suggestions on how to make this work. The wound is on the bottom and lateral part of his left foot. Is cultured predominantly grew low amounts of Enterococcus, E. coli and anaerobes. There were multiple resistance genes detected including extended spectrum beta lactamase, quinolone, tetracycline. I could not think of an easy oral combination to address this so for now I am going to do topical antibiotics provided by [facility] I think the main agents here are vancomycin and an aminoglycoside. We have to be able to give him access to the wounds to get the topical antibiotic on 2/17; moderate amount of drainage this is unchanged. He has his Keystone topical antibiotic against the deep tissue culture organisms. He has been using this and changing the dressing daily. Silver alginate on the wound surface. 2/24; using Keystone antibiotic with silver alginate on the top. He had too much drainage for a total contact cast at one point although I think that is improving and I think in the next week or 2 it might be possible to replace a total contact cast I did not do this today. In general the wound surface looks healthy however he continues to have thick rims of skin and subcutaneous tissue around the wide area of the circumference which I debrided 06/04/2020 upon evaluation today patient appears to be doing well in regard to his wound. I do feel like he is showing signs of improvement. There is little bit of callus and dead tissue around the edges of the wound as well as what appears to be a little bit  of a sinus  tract that is off to the side laterally I would perform debridement to clear that away today. 3/17; left lateral foot. The wound looks about the same as I remember. Not much depth surface looks healthy. No evidence of infection 3/25; left lateral foot. Wound surface looks about the same. Separating epithelium from the circumference. There really is no evidence of infection here however not making progress by my view 3/29; left lateral foot. Surface of the wound again looks reasonably healthy still thick skin and subcutaneous tissue around the wound margins. There is no evidence of infection. One of the concerns being brought up by the nurses has again the amount of drainage vis--vis continued use of a total contact cast 4/5; left lateral foot at roughly the base of the fifth metatarsal. Nice healthy looking granulated tissue with rims of epithelialization. The overall wound measurements are not any better but the tissue looks healthy. The only concern is the amount of drainage although he has no surrounding maceration with what we have been doing recently to absorb fluid and protect his skin. He also has lymphedema. 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 He states that the kerlix/coban under the TCC rolled down and could not get it back up. He has been using an offloading scooter and has somehow hurt his right foot using this device. This happened last week. He states that the side of his right foot developed a blister and opened. The top of his foot also has a few small open wounds he thinks is due to his socks rubbing in his shoes. He has not been using any dressings to the wound. He denies purulent drainage, fever/chills or erythema to the  wounds. 4/22; patient presents for 1 week follow-up. He developed new wounds to the right foot that were evaluated at last clinic visit. He continues to have a total contact cast to the left leg and he reports no issues. He has been using silver collagen to the right foot wounds with no issues. He denies purulent drainage, fever/chills or erythema to the right foot wounds. He has no complaints today 4/25; patient presents for 1 week follow-up. He has a total contact cast of the left leg and reports no issues. He has been using silver alginate to the right foot wound. He denies purulent drainage, fever/chills or erythema to the right foot wounds. 5/2 patient presents for 1 week follow-up. T contact cast on the left. The wound which is on the base of the plantar foot at the base of the fifth metatarsal otal actually looks quite good and dimensions continue to gradually contract. HOWEVER the area on the right lateral fifth metatarsal head is much larger than what I remember from 2 weeks ago. Once more is he has significant levels of hypergranulation. Noteworthy that he had this same hyper granulated response on his wound on the left foot at one point in time. So much so that he I thought there was an underlying fluid collection. Based on this I think this just needs debridement. 5/9; the wound on the left actually continues to be gradually smaller with a healthy surface. Slight amount of drainage and maceration of the skin around but not too bad.  However he has a large wound over the right fifth metatarsal head very much in the same configuration as his left foot wound was initially. I used silver nitrate to address the hyper granulated tissue no mechanical debridement 5/16; area on the left foot did not look as healthy this week deeper thick surrounding macerated skin and subcutaneous tissue. oo The area on the right foot fifth met head was about the same oo The area on the right ankle that we  identified last week is completely broken down into an open wound presumably a stocking rubbing issue 5/23; patient has been using a total contact cast to the left side. He has been using silver alginate underneath. He has also been using silver alginate to the right foot wounds. He has no complaints today. He denies any signs of infection. 5/31; the left-sided wound looks some better measure smaller surface granulation looks better. We have been using silver alginate under the total contact cast oo The large area on his right fifth met head and right dorsal foot look about the same still using silver alginate 6/6; neither side is good as I was hoping although the surface area dimensions are better. A lot of maceration on his left and right foot around the wound edge. Area on the dorsal right foot looks better. He says he was traveling. I am not sure what does the amount of maceration around the plantar wounds may be drainage issues 6/13; in general the wound surfaces look quite good on both sides. Macerated skin and raised edges around the wound required debridement although in general especially on the left the surface area seems improved. oo The area on the right dorsal ankle is about the same I thought this would not be such a problem to close 6/20; not much change in either wound although the one on the right looks a little better. Both wounds have thick macerated edges to the skin requiring debridements. We have been using silver alginate. The area on the dorsal right ankle is still open I thought this would be closed. 6/28; patient comes in today with a marked deterioration in the right foot wound fifth met head. Wide area of exposed bone this is a drastic change from last time. The area on the left there we have been casting is stagnant. We have been using silver alginate in both wound areas. 7/5; bone culture I did for PCR last time was positive for Pseudomonas, group B strep, Enterococcus  and Staph aureus. There was no suggestion of methicillin resistance or ampicillin resistant genes. This was resistant to tetracycline however He comes into the clinic today with the area over his right plantar fifth metatarsal head which had been doing so well 2 weeks ago completely necrotic feeling bone. I do not know that this is going to be salvageable. The left foot wound is certainly no smaller but it has a better surface and is superficial. 7/8; patient called in this morning to say that his total contact cast was rubbing against his foot. He states he is doing fine overall. He denies signs of infection. 7/12; continued deterioration in the wound over the right fifth metatarsal head crumbling bone. This is not going to be salvageable. The patient agrees and wants to be referred to Dr. Doran Durand which we will attempt to arrange as soon as possible. I am going to continue him on antibiotics as long as that takes so I will renew those today. The area on the left foot  which is the base of the fifth metatarsal continues to look somewhat better. Healthy looking tissue no depth no debridement is necessary here. 7/20; the patient was kindly seen by Dr. Doran Durand of orthopedics on 10/19/2020. 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 Towards this end we have taken him out of the total contact cast on the left we will put him in a wrap with Hydrofera Blue. As I understand things surgery is planned for 7/21 7/27; patient had his surgery last Thursday. 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 Apparently everything went well we did not still disturb that today The area on the left foot actually looks quite good. He has been much less mobile which probably explains this he did not seem to do well in the total contact cast secondary to drainage and maceration I think. We have been using Hydrofera Blue 11/09/2020 upon  evaluation today patient appears to be doing well with regard to his plantar foot ulcer on the left foot. Fortunately there is no evidence of active infection at this time. No fevers, chills, nausea, vomiting, or diarrhea. Overall I think that he is actually doing extremely well. Nonetheless I do believe that he is staying off of this more following the surgery in his right foot that is the reason the left is doing so great. 8/16; left plantar foot wound. This looks smaller than the last time I saw this he is using Hydrofera Blue. The surgical wound on the right foot is being followed by Dr. Doran Durand we did not look at this today. He has surgical shoes on both feet 8/23; left plantar foot wound not as good this week. Surrounding macerated skin and subcutaneous tissue everything looks moist and wet. I do not think he is offloading this adequately. He is using a surgical shoe Apparently the right foot surgical wound is not open although I did not check his foot 8/31; left plantar foot lateral aspect. Much improved this week. He has no maceration. Some improvement in the surface area of the wound but most impressively the depth is come in we are using silver alginate. The patient is a Product/process development scientist. He is asked that we write him a letter so he can go back to work. I have also tried to see if we can write something that will allow him to limit the amount of time that he is on his foot at work. 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 Hopefully the latter can be avoided 9/6; I believe the patient missed an appointment last week. He arrives in today with a wound looking roughly the same certainly no better. Undermining laterally and also inferiorly. We used molecuLight today in training with the patient's permission.. We are using silver alginate 9/21 wound is measuring bigger this week although this may have to do with  the aggressive circumferential debridement last week in response to the blush fluorescence on the MolecuLight. Culture I did last week showed significant MSSA and E. coli. I put him on Augmentin but he has not started it yet. We are also going to send this for compounded antibiotics at [facility]. There is no evidence of systemic infection 9/29; silver alginate. His Keystone arrived. He is completing Augmentin in 2 days. Offloading in a cam boot. Moderate drainage  per our intake staff 10/5; using silver alginate. He has been using his [place]. He has completed his Augmentin. Per our intake nurse still a lot of drainage, far too much to consider a total contact cast. Wound measures about the same. He had the same undermining area that I defined last week from a roughly 11-3. I remove this today 10/12; using silver alginate he is using the [place]. He comes in for a nurse visit hence we are applying [employer] twice a week. Measuring slightly better today and less notable drainage. Extensive debridement of the wound edge last time 10/18; using topical Keystone and silver alginate and a soft cast. Wound measurements about the same. Drainage was through his soft cast. We are changing this twice a week Tuesdays and Friday 10/25; comes in with moderate drainage. Still using Keystone silver alginate and a soft cast. Wound dimensions completely the same.He has a lot of edema in the left leg he has lymphedema. Asking for [name] to consider wrapping him as he cannot get his stocking on over the soft cast 11/2; comes in with moderate to large drainage slightly smaller in terms of width we have been using [place]. His wound looks satisfactory but not much improvement 11/4; patient presents today for obligatory cast change. Has no issues or complaints today. He denies signs of infection. 11/9; patient traveled this weekend to [place], was on the cast quite a bit. Staining of the cast with black material from his walking  boot. Drainage was not quite as bad as we feared. Using silver alginate and Keystone 11/16; we do not have size for cast therefore we have been putting a soft cast on him since the change on Friday. Still a significant amount of drainage necessitating changing twice a week. We have been using the Keystone at cast changes either hard or soft as well as silver alginate Comes in the clinic with things actually looking fairly good improvement in width. He says his offloading is about the same 02/24/2021 upon evaluation today patient actually comes back in and is doing excellent in regard to his foot ulcer this is significantly smaller even compared to the last visit. The soft cast seems to have done extremely well for him which is great news. I do not see any signs of infection minimal debridement will be needed today. 11/30; left lateral foot much improved half a centimeter improvement in surface area. No evidence of infection. He seems to be doing better with the soft cast in the TCC therefore we will continue with this. He comes back in later in the week for a change with the nurses. This is due to drainage 12/6; no improvement in dimensions. Under illumination some debris on the surface we have been using silver alginate, soft cast. If there is anything optimistic here he seems to have have less drainage 12/13. Dimensions are improved both length and width and slightly in depth. Appears to be quite healthy today. Raised edges of this thick skin and callus around the edges however. He is in a soft cast were bringing him back once for a change on Friday. Drainage is better 12/20. Dimensions are improved. He still has raised edges of thick skin and callus around the edges. We are using a soft cast 12/28; comes in today with thick callus around the wound. Using silver under alginate under a soft cast. I do not think there is much improvement in any measurement 2023 04/06/2021; patient was put in a total  contact cast.  Unfortunately not much change in surface area 1/10; not much different still thick callus and skin around the edge in spite of the total contact cast. This was just debrided last week we have been using the [facility] compounded antibiotic and silver alginate under a total contact cast Objective Constitutional Sitting or standing Blood Pressure is within target range for patient.. Pulse regular and within target range for patient.Marland Kitchen Respirations regular, non-labored and within target range.. Temperature is normal and within the target range for the patient.Marland Kitchen Appears in no distress. Vitals Time Taken: 7:52 AM, Height: 77 in, Weight: 280 lbs, BMI: 33.2, Temperature: 98.5 F, Pulse: 85 bpm, Respiratory Rate: 16 breaths/min, Blood Pressure: 140/86 mmHg, Capillary Blood Glucose: 85 mg/dl. General Notes: Wound exam; no change in measurements. On the distal part of the wound surface there is a small [place] of epithelialization. Thick skin and callus around the margins of this continue in spite of the total contact cast. No evidence of infection Integumentary (Hair, Skin) Wound #3 status is Open. Original cause of wound was Trauma. The date acquired was: 10/02/2019. The wound has been in treatment 76 weeks. The wound is located on the Left,Lateral Foot. The wound measures 1.2cm length x 1.3cm width x 0.3cm depth; 1.225cm^2 area and 0.368cm^3 volume. There is Fat Layer (Subcutaneous Tissue) exposed. There is no tunneling or undermining noted. There is a medium amount of serosanguineous drainage noted. The wound margin is thickened. There is large (67-100%) red, pink granulation within the wound bed. There is no necrotic tissue within the wound bed. 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 There was a T Contact Cast otal Procedure by [name], MD. Post procedure Diagnosis Wound #3: Same as Pre-Procedure Plan Follow-up Appointments: Return Appointment in 1 week. - Dr. Arcola Jansky Shower/ Hygiene: May shower with protection but do not get wound dressing(s) wet. - Use Cast Protector Bag Edema Control - Lymphedema / SCD / Other: Elevate legs to the level of the heart or above for 30 minutes daily and/or when sitting, a frequency of: - throughout the day Avoid standing for long periods of time. Exercise regularly Moisturize legs daily. - right leg every night before bed. Compression stocking or Garment 20-30 mm/Hg pressure to: - Apply to right leg in the morning and remove at night. Off-Loading: T Contact Cast to Left Lower Extremity otal Other: - minimal weight bearing left foot Additional Orders / Instructions: Follow Nutritious Diet WOUND #3: - Foot Wound Laterality: Left, Lateral Cleanser: Soap and Water 1 x Per Week/30 Days Discharge Instructions: May shower and wash wound with dial antibacterial soap and water prior to dressing change. Cleanser: Wound Cleanser (Generic) 1 x Per Week/30 Days Discharge Instructions: Cleanse the wound with wound cleanser prior to applying a clean dressing using gauze sponges, not tissue or cotton balls. Peri-Wound Care: Zinc Oxide Ointment 30g tube 1 x Per Week/30 Days Discharge Instructions: Apply Zinc Oxide to periwound with each dressing change as needed. Topical: keystone antibiotic compound 1 x Per Week/30 Days Discharge Instructions: thin layer to wound bed Prim Dressing: KerraCel Ag Gelling Fiber Dressing, 4x5 in (silver alginate) (Generic) 1 x Per Week/30 Days ary Discharge Instructions: Apply silver alginate to wound bed as instructed Secondary Dressing: Zetuvit Plus  4x8 in 1 x Per Week/30 Days Discharge Instructions:  Apply over primary dressing as directed. Com pression Wrap: Kerlix Roll 4.5x3.1 (in/yd) 1 x Per Week/30 Days Discharge Instructions: Apply Kerlix and Coban compression as directed. Com pression Wrap: Coban Self-Adherent Wrap 4x5 (in/yd) 1 x Per Week/30 Days Discharge Instructions: Apply over Kerlix as directed. 1. I continue with the [facility] antibiotic and the silver alginate in the wound 2. I did not debride the edges of this this week but will probably have to do this next week. 3. Careful attention to the distal part of this circumference. There is a small area of epithelialization here. It would be nice to see this expand Electronic Signature(s) Signed: 04/13/2021 4:28:08 PM By: [name] MD Entered By: [name] on 04/13/2021 08:21:06 -------------------------------------------------------------------------------- Total Contact Cast Details Patient Name: Date of Service: [name], [name] 04/13/2021 7:30 A M Medical Record Number: [id number] Patient Account Number: [id number] Date of Birth/Sex: Treating RN: 1986/02/28 (36 y.o. [name] Primary Care Provider: [name] Other Clinician: Referring Provider: Treating Provider/Extender: [name] in Treatment: 76 T Contact Cast Applied for Wound Assessment: otal Wound #3 Left,Lateral Foot Performed By: Physician [name], MD Post Procedure Diagnosis Same as Pre-procedure Electronic Signature(s) Signed: 04/13/2021 4:28:08 PM By: [name] MD Entered By: [name] on 04/13/2021 08:16:13 -------------------------------------------------------------------------------- SuperBill Details Patient Name: Date of Service: [name], [name] 04/13/2021 Medical Record Number: [id number] Patient Account Number: [id number] Date of Birth/Sex: Treating RN: September 20, 1985 (36 y.o. [name] Primary Care Provider: [name] Other Clinician: Referring  Provider: Treating Provider/Extender: [name] in Treatment: 76 Diagnosis Coding ICD-10 Codes Code Description E11.621 Type 2 diabetes mellitus with foot ulcer L97.528 Non-pressure chronic ulcer of other part of left foot with other specified severity Facility Procedures CPT4 Code: [id number] Description: [id number] - APPLY TOTAL CONTACT LEG CAST ICD-10 Diagnosis Description L97.528 Non-pressure chronic ulcer of other part of left foot with other specified sever Modifier: ity Quantity: 1 Physician Procedures : CPT4 Code Description Modifier [id number] - WC PHYS APPLY TOTAL CONTACT CAST ICD-10 Diagnosis Description L97.528 Non-pressure chronic ulcer of other part of left foot with other specified severity Quantity: 1 Electronic Signature(s) Signed: 04/13/2021 4:28:08 PM By: [name] MD Entered By: [name] on 04/13/2021 81:66:19

## 2021-04-21 ENCOUNTER — Other Ambulatory Visit: Payer: Self-pay

## 2021-04-21 ENCOUNTER — Encounter (HOSPITAL_BASED_OUTPATIENT_CLINIC_OR_DEPARTMENT_OTHER): Payer: BC Managed Care – PPO | Admitting: Internal Medicine

## 2021-04-21 DIAGNOSIS — E11621 Type 2 diabetes mellitus with foot ulcer: Secondary | ICD-10-CM | POA: Diagnosis not present

## 2021-04-21 NOTE — Progress Notes (Addendum)
[name], [name] ([id number]) Visit Report for 04/21/2021 Debridement Details Patient Name: Date of Service: [name] D 04/21/2021 7:45 A M Medical Record Number: [id number] Patient Account Number: [id number] Date of Birth/Sex: Treating RN: 1986/01/31 (36 y.o. [name], [name] Primary Care Provider: [name] Other Clinician: Referring Provider: Treating Provider/Extender: [name] in Treatment: 77 Debridement Performed for Assessment: Wound #8 Right,Lateral Foot Performed By: Physician [name], MD Debridement Type: Debridement Severity of Tissue Pre Debridement: Fat layer exposed Level of Consciousness (Pre-procedure): Awake and Alert Pre-procedure Verification/Time Out Yes - 08:30 Taken: Start Time: 08:30 Pain Control: Lidocaine T Area Debrided (L x W): otal 2 (cm) x 2 (cm) = 4 (cm) Tissue and other material debrided: Viable, Non-Viable, Slough, Subcutaneous, Skin: Dermis , Skin: Epidermis, Slough Level: Skin/Subcutaneous Tissue Debridement Description: Excisional Instrument: Curette, Forceps, Scissors Specimen: Tissue Culture Number of Specimens T aken: 1 Bleeding: Moderate Hemostasis Achieved: Silver Nitrate End Time: 08:30 Procedural Pain: 0 Post Procedural Pain: 0 Response to Treatment: Procedure was tolerated well Level of Consciousness (Post- Awake and Alert procedure): Post Debridement Measurements of Total Wound Length: (cm) 2 Width: (cm) 2 Depth: (cm) 0.3 Volume: (cm) 0.942 Character of Wound/Ulcer Post Debridement: Improved Severity of Tissue Post Debridement: Fat layer exposed Post Procedure Diagnosis Same as Pre-procedure Electronic Signature(s) Signed: 04/21/2021 3:43:46 PM By: [name] MD Signed: 04/21/2021 4:57:53 PM By: Rhae Hammock RN Entered By: Rhae Hammock on 04/21/2021 08:39:13 -------------------------------------------------------------------------------- HPI  Details Patient Name: Date of Service: [name], [name] 04/21/2021 7:45 A M Medical Record Number: [id number] Patient Account Number: [id number] Date of Birth/Sex: Treating RN: 25-Apr-1985 (36 y.o. 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 He comes in for review of a wound over the plantar fifth metatarsal head extending into the lateral part of the foot. He was followed for this previously by his podiatrist Dr. Cornelius Moras. As the patient tells his story he went to see podiatry first for a swelling he developed on the lateral part of his fifth metatarsal head in May. He states this was "open" by podiatry and the area closed. He was followed up in June and it was again opened callus removed and it closed promptly. There were plans being made for surgery on the fifth metatarsal head in June however his blood sugar was apparently too high for anesthesia. Apparently the area was debrided and opened again in June and it is never closed since. Looking over the records from podiatry I am really not able to follow this. It was clear when he was first seen it was before 5/14 at that point he already had a wound. By 5/17 the ulcer was resolved. I do not see anything about a procedure. On 5/28 noted to have pre-ulcerative moderate keratosis. X-ray noted 1/5 contracted toe and tailor's bunion and metatarsal deformity. On a visit date on 09/28/2018 the dorsal part of the left foot it healed and resolved. There was concern about swelling in his lower extremity he was sent to the ER.. As far as I can tell he was seen in the ER on 7/12 with an ulcer on his left foot. A DVT rule out of the left leg was negative. I do not think I have  complete records from podiatry but  I am not able to verify the procedures this patient states he had. He states after the last procedure the wound has never closed although I am not able to follow this in the records I have from podiatry. He has not had a recent x-ray The patient has been using Neosporin on the wound. He is wearing a Darco shoe. He is still very active up on his foot working and exercising. Past medical history; type 2 diabetes ketosis-prone, leg swelling with a negative DVT study in July. Non-smoker ABI in our clinic was 0.85 on the left 10/16; substantial wound on the plantar left fifth met head extending laterally almost to the dorsal fifth MTP. We have been using silver alginate we gave him a Darco forefoot off loader. An x-ray did not show evidence of osteomyelitis did note soft tissue emphysema which I think was due to gas tracking through an open wound. There is no doubt in my mind he requires an MRI 10/23; MRI not booked until 3 November at the earliest this is largely due to his glucose sensor in the right arm. We have been using silver alginate. There has been an improvement 10/29; I am still not exactly sure when his MRI is booked for. He says it is the third but it is the 10th in epic. This definitely needs to be done. He is running a low-grade fever today but no other symptoms. No real improvement in the 1 02/26/2019 patient presents today for a follow-up visit here in our clinic he is last been seen in the clinic on October 29. Subsequently we were working on getting MRI to evaluate and see what exactly was going on and where we would need to go from the standpoint of whether or not he had osteomyelitis and again what treatments were going be required. Subsequently the patient ended up being admitted to the hospital on 02/07/2019 and was discharged on 02/14/2019. This is a somewhat interesting admission with a discharge diagnosis of pneumonia due to COVID-19 although he  was positive for COVID-19 when tested at the urgent care but negative x2 when he was actually in the hospital. With that being said he did have acute respiratory failure with hypoxia and it was noted he also have a left foot ulceration with osteomyelitis. With that being said he did require oxygen for his pneumonia and I level 4 L. He was placed on antivirals and steroids for the COVID-19. He was also transferred to the [facility] at one point. Nonetheless he did subsequently discharged home and since being home has done much better in that regard. The CT angiogram did not show any pulmonary embolism. With regard to the osteomyelitis the patient was placed on vancomycin and Zosyn while in the hospital but has been changed to Augmentin at discharge. It was also recommended that he follow- up with wound care and podiatry. Podiatry however wanted him to see [name] according to the patient prior to them doing anything further. His hemoglobin A1c was 9.9 as noted in the hospital. Have an MRI of the left foot performed while in the hospital on 02/04/2019. 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 There is an overlying plantar open wound noted an abscess tracking back along the lateral aspect of the fifth metatarsal shaft. There is otherwise diffuse cellulitis and mild fasciitis without findings of polymyositis. The patient did have recently pneumonia secondary to COVID-19 I  looked in the chart through epic and it does appear that the patient may need to have an additional x-ray just to ensure everything is cleared and that he has no airspace disease prior to putting him into the [name]. 03/05/2019; patient was readmitted to the clinic last week. He was hospitalized twice for a viral upper respiratory tract infection from 11/1 through 11/4 and then 11/5 through 11/12 ultimately this turned out to be Covid  pneumonitis. Although he was discharged on oxygen he is not using it. He says he feels fine. He has no exercise limitation no cough no sputum. His O2 sat in our clinic today was 100% on room air. 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 He received Vanco and Zosyn in the hospital and then was discharged on 2 weeks of Augmentin. I do not see any relevant cultures. He was supposed to follow-up with infectious disease but I do not see that he has an appointment. 12/8; patient saw Dr. Novella Olive of infectious disease last week. He felt that he had had adequate antibiotic therapy. He did not go to follow-up with Dr. Amalia Hailey of podiatry and I have again talked to him about the pros and cons of this. He does not want to consider a ray amputation of this time. He is aware of the risks of recurrence, migration etc. He started HBO today and tolerated this well. He can complete the Augmentin that I gave him last week. I have looked over the lab work that Dr. Chana Bode ordered his C-reactive protein was 3.3 and his sedimentation rate was 17. The C-reactive protein is never really been measurably that high in this patient 12/15; not much change in the wound today however he has undermining along the lateral part of the foot again more extensively than last week. He has some rims of epithelialization. We have been using silver alginate. He is undergoing hyperbarics but did not dive today 12/18; in for his obligatory first total contact cast change. Unfortunately there was pus coming from the undermining area around his fifth metatarsal head. This was cultured but will preclude reapplication of a cast. He is seen in conjunction with HBO 12/24; patient had staph lugdunensis in the wound in the undermining area laterally last time. We put him on doxycycline which should have covered this. The wound looks  better today. I am going to give him another week of doxycycline before reattempting the total contact cast 12/31; the patient is completing antibiotics. Hemorrhagic debris in the distal part of the wound with some undermining distally. He also had hyper granulation. Extensive debridement with a #5 curette. The infected area that was on the lateral part of the fifth met head is closed over. I do not think he needs any more antibiotics. Patient was seen prior to HBO. Preparations for a total contact cast were made in the cast will be placed post hyperbarics 04/11/19; once again the patient arrives today without complaint. He had been in a cast all week noted that he had heavy drainage this week. This resulted in large raised areas of macerated tissue around the wound 1/14; wound bed looks better slightly smaller. Hydrofera Blue has been changing himself. He had a heavy drainage last week which caused a lot of maceration around the wound so I took him out of a total contact cast he says the drainage is actually better this week  He is seen today in conjunction with HBO 1/21; returns to clinic. He was up in [place] for a day or 2 attending a funeral. He comes back in with the wound larger and with a large area of exposed bone. He had osteomyelitis and septic arthritis of the fifth left metatarsal head while he was in hospital. He received IV antibiotics in the hospital for a prolonged period of time then 3 weeks of Augmentin. Subsequently I gave him 2 weeks of doxycycline for more superficial wound infection. When I saw this last week the wound was smaller the surface of the wound looks satisfactory. 1/28; patient missed hyperbarics today. Bone biopsy I did last time showed Enterococcus faecalis and Staphylococcus lugdunensis . He has a wide area of exposed bone. We are going to use silver alginate as of today. I had another ethical discussion with the patient. This would be recurrent osteomyelitis he is  already received IV antibiotics. In this situation I think the likelihood of healing this is low. Therefore I have recommended a ray amputation and with the patient's agreement I have referred him to Dr. Doran Durand. The other issue is that his compliance with hyperbarics has been minimal because of his work schedule and given his underlying decision I am going to stop this today READMISSION 10/24/2019 MRI 09/29/2019 left foot IMPRESSION: 1. Apparent skin ulceration inferior and lateral to the 5th metatarsal base with underlying heterogeneous T2 signal and enhancement in the subcutaneous fat. Small peripherally enhancing fluid collections along the plantar and lateral aspects of the 5th metatarsal base suspicious for abscesses. 2. Interval amputation through the mid 5th metatarsal with nonspecific low-level marrow edema and enhancement. Given the proximity to the adjacent soft tissue inflammatory changes, osteomyelitis cannot be excluded. 3. The additional bones appear unremarkable. MRI 09/29/2019 right foot IMPRESSION: 1. Soft tissue ulceration lateral to the 5th MTP joint. There is low-level T2 hyperintensity within the 4th and 5th metatarsal heads and adjacent proximal phalanges without abnormal T1 signal or cortical destruction. These findings are nonspecific and could be seen with early marrow edema, hyperemia or early osteomyelitis. No evidence of septic joint. 2. Mild tenosynovitis and synovial enhancement associated with the extensor digitorum tendons at the level of the midfoot. 3. Diffuse low-level muscular T2 hyperintensity and enhancement, most consistent with diabetic myopathy. LEFT FOOT BONE Methicillin resistant staphylococcus aureus Staphylococcus lugdunensis MIC MIC CIPROFLOXACIN >=8 RESISTANT Resistant <=0.5 SENSI... Sensitive CLINDAMYCIN <=0.25 SENS... Sensitive >=8 RESISTANT Resistant ERYTHROMYCIN >=8 RESISTANT Resistant >=8 RESISTANT Resistant GENTAMICIN <=0.5  SENSI... Sensitive <=0.5 SENSI... Sensitive Inducible Clindamycin NEGATIVE Sensitive NEGATIVE Sensitive OXACILLIN >=4 RESISTANT Resistant 2 SENSITIVE Sensitive RIFAMPIN <=0.5 SENSI... Sensitive <=0.5 SENSI... Sensitive TETRACYCLINE <=1 SENSITIVE Sensitive <=1 SENSITIVE Sensitive TRIMETH/SULFA <=10 SENSIT Sensitive <=10 SENSIT Sensitive ... Marland Kitchen.. VANCOMYCIN 1 SENSITIVE Sensitive <=0.5 SENSI... Sensitive Right foot bone . Component 3 wk ago Specimen Description BONE Special Requests RIGHT 4 METATARSAL SAMPLE B Gram Stain NO WBC SEEN NO ORGANISMS SEEN Culture RARE METHICILLIN RESISTANT STAPHYLOCOCCUS AUREUS NO ANAEROBES ISOLATED Performed at [facility], [place] [address], [place], [place] [address] Report Status 10/08/2019 FINAL Organism ID, Bacteria METHICILLIN RESISTANT STAPHYLOCOCCUS AUREUS Resulting Agency [facility] Susceptibility Methicillin resistant staphylococcus aureus MIC CIPROFLOXACIN >=8 RESISTANT Resistant CLINDAMYCIN <=0.25 SENS... Sensitive ERYTHROMYCIN >=8 RESISTANT Resistant GENTAMICIN <=0.5 SENSI... Sensitive Inducible Clindamycin NEGATIVE Sensitive OXACILLIN >=4 RESISTANT Resistant RIFAMPIN <=0.5 SENSI... Sensitive TETRACYCLINE <=1 SENSITIVE Sensitive TRIMETH/SULFA <=10 SENSIT Sensitive ... VANCOMYCIN 1 SENSITIVE Sensitive This is a patient we had in clinic earlier this  year with a wound over his left fifth metatarsal head. 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 Doran Durand on 05/16/2019. He seems to have developed problems with wounds on his bilateral feet in June 2021 although he may have had problems earlier than this. He was in an urgent care with a right foot ulcer on  09/26/2019 and given a course of doxycycline. This was apparently after having trouble getting into see orthopedics. He was seen by podiatry on 09/28/2019 noted to have bilateral lower extremity ulcers including the left lateral fifth metatarsal base and the right subfifth met head. It was noted that had purulent drainage at that time. He required hospitalization from 6/20 through 7/2. This was because of worsening right foot wounds. He underwent bilateral operative incision and drainage and bone biopsies bilaterally. Culture results are listed above. He has been referred back to clinic by Dr. Jacqualyn Posey of podiatry. He is also followed by Dr. Megan Salon who saw him yesterday. He was discharged from hospital on Zyvox Flagyl and Levaquin and yesterday changed to doxycycline Flagyl and Levaquin. His inflammatory markers on 6/26 showed a sedimentation rate of 129 and a C-reactive protein of 5. This is improved to 14 and 1.3 respectively. This would indicate improvement. ABIs in our clinic today were 1.23 on the right and 1.20 on the left 11/01/2019 on evaluation today patient appears to be doing fairly well in regard to the wounds on his feet at this point. Fortunately there is no signs of active infection at this time. No fevers, chills, nausea, vomiting, or diarrhea. He currently is seeing infectious disease and still under their care at this point. Subsequently he also has both wounds which she has not been using collagen on as he did not receive that in his packaging he did not call us and let us know that. Apparently that just was missed on the order. Nonetheless we will get that straightened out today. 8/9-Patient returns for bilateral foot wounds, using Prisma with hydrogel moistened dressings, and the wounds appear stable. Patient using surgical shoes, avoiding much pressure or weightbearing as much as possible 8/16; patient has bilateral foot wounds. 1 on the right lateral foot proximally the other is on  the left mid lateral foot. Both required debridement of callus and thick skin around the wounds. We have been using silver collagen 8/27; patient has bilateral lateral foot wounds. The area on the left substantially surrounded by callus and dry skin. This was removed from the wound edge. The underlying wound is small. The area on the right measured somewhat smaller today. 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 Unfortunately I did not update his antibiotics during today's visit. 9/10 I reviewed Dr. Hale Bogus last notes he felt he had completed antibiotics his inflammatory markers were reasonably well controlled. He has a small wound on the lateral left foot and a tiny area on the right which is just above closed. He is using Hydrofera Blue with border foam he has bilateral surgical shoes 9/24; 2 week f/u. doing well. right foot is closed. left foot still undermined. 10/14; right foot remains closed at the fifth met head. The area over the base of the left fifth metatarsal  has a small open area but considerable undermining towards the plantar foot. Thick callus skin around this suggests an adequate pressure relief. We have talked about this. He says he is going to go back into his cam boot. I suggested a total contact cast he did not seem enamored with this suggestion 10/26; left foot base of the fifth metatarsal. Same condition as last time. He has skin over the area with an open wound however the skin is not adherent. He went to see Dr. Earleen Newport who did an x-ray and culture of his foot I have not reviewed the x-ray but the patient was not told anything. He is on doxycycline 11/11; since the patient was last here he was in the emergency room on 10/30 he was concerned about swelling in the left foot. They did not do any cultures or x-rays. They changed his antibiotics to cephalexin. Previous culture showed group B strep. The  cephalexin is appropriate as doxycycline has less than predictable coverage. Arrives in clinic today with swelling over this area under the wound. He also has a new wound on the right fifth metatarsal head 11/18; the patient has a difficult wound on the lateral aspect of the left fifth metatarsal head. The wound was almost ballotable last week I opened it slightly expecting to see purulence however there was just bleeding. I cultured this this was negative. X-ray unchanged. We are trying to get an MRI but I am not sure were going to be able to get this through his insurance. He also has an area on the right lateral fifth metatarsal head this looks healthier 12/3; the patient finally got our MRI. Surprisingly this did not show osteomyelitis. I did show the soft tissue ulceration at the lateral plantar aspect of the fifth metatarsal base with a tiny residual 6 mm abscess overlying the superficial fascia I have tried to culture this area I have not been able to get this to grow anything. Nevertheless the protruding tissue looks aggravated. I suspect we should try to treat the underlying "abscess with broad-spectrum antibiotics. I am going to start him on Levaquin and Flagyl. He has much less edema in his legs and I am going to continue to wrap his legs and see him weekly 12/10. I started Levaquin and Flagyl on him last week. He just picked up the Flagyl apparently there was some delay. The worry is the wound on the left fifth metatarsal base which is substantial and worsening. His foot looks like he inverts at the ankle making this a weightbearing surface. Certainly no improvement in fact I think the measurements of this are somewhat worse. We have been using 12/17; he apparently just got the Levaquin yesterday this is 2 weeks after the fact. He has completed the Flagyl. The area over the left fifth metatarsal base still has protruding granulation tissue although it does not look quite as bad as it did  some weeks ago. He has severe bilateral lymphedema although we have not been treating him for wounds on his legs this is definitely going to require compression. There was so much edema in the left I did not wish to put him in a total contact cast today. I am going to increase his compression from 3-4 layer. The area on the right lateral fifth met head actually look quite good and superficial. 12/23; patient arrived with callus on the right fifth met head and the substantial hyper granulated callused wound on the base of his fifth metatarsal. He  says he is completing his Levaquin in 2 days but I do not think that adds up with what I gave him but I will have to double check this. We are using Hydrofera Blue on both areas. My plan is to put the left leg in a cast the week after New Year's 04/06/2020; patient's wounds about the same. Right lateral fifth metatarsal head and left lateral foot over the base of the fifth metatarsal. There is undermining on the left lateral foot which I removed before application of total contact cast continuing with Hydrofera Blue new. Patient tells me he was seen by endocrinology today lab work was done [Dr. Kerr]. Also wondering whether he was referred to cardiology. I went over some lab work from previously does not have chronic renal failure certainly not nephrotic range proteinuria he does have very poorly controlled diabetes but this is not his most updated lab work. Hemoglobin A1c has been over 11 1/10; the patient had a considerable amount of leakage towards mid part of his left foot with macerated skin however the wound surface looks better the area on the right lateral fifth met head is better as well. I am going to change the dressing on the left foot under the total contact cast to silver alginate, continue with Hydrofera Blue on the right. 1/20; patient was in the total contact cast for 10 days. Considerable amount of drainage although the skin around the wound  does not look too bad on the left foot. The area on the right fifth metatarsal head is closed. Our nursing staff reports large amount of drainage out of the left lateral foot wound 1/25; continues with copious amounts of drainage described by our intake staff. PCR culture I did last week showed E. coli and Enterococcus faecalis and low quantities. Multiple resistance genes documented including extended spectrum beta lactamase, MRSA, MRSE, quinolone, tetracycline. The wound is not quite as good this week as it was 5 days ago but about the same size 2/3; continues with copious amounts of malodorous drainage per our intake nurse. The PCR culture I did 2 weeks ago showed E. coli and low quantities of Enterococcus. There were multiple resistance genes detected. I put Neosporin on him last week although this does not seem to have helped. The wound is slightly deeper today. Offloading continues to be an issue here although with the amount of drainage she has a total contact cast is just not going to work 2/10; moderate amount of drainage. Patient reports he cannot get his stocking on over the dressing. I told him we have to do that the nurse gave him suggestions on how to make this work. The wound is on the bottom and lateral part of his left foot. Is cultured predominantly grew low amounts of Enterococcus, E. coli and anaerobes. There were multiple resistance genes detected including extended spectrum beta lactamase, quinolone, tetracycline. I could not think of an easy oral combination to address this so for now I am going to do topical antibiotics provided by [facility] I think the main agents here are vancomycin and an aminoglycoside. We have to be able to give him access to the wounds to get the topical antibiotic on 2/17; moderate amount of drainage this is unchanged. He has his Keystone topical antibiotic against the deep tissue culture organisms. He has been using this and changing the dressing daily.  Silver alginate on the wound surface. 2/24; using Keystone antibiotic with silver alginate on the top. He had too much  drainage for a total contact cast at one point although I think that is improving and I think in the next week or 2 it might be possible to replace a total contact cast I did not do this today. In general the wound surface looks healthy however he continues to have thick rims of skin and subcutaneous tissue around the wide area of the circumference which I debrided 06/04/2020 upon evaluation today patient appears to be doing well in regard to his wound. I do feel like he is showing signs of improvement. There is little bit of callus and dead tissue around the edges of the wound as well as what appears to be a little bit of a sinus tract that is off to the side laterally I would perform debridement to clear that away today. 3/17; left lateral foot. The wound looks about the same as I remember. Not much depth surface looks healthy. No evidence of infection 3/25; left lateral foot. Wound surface looks about the same. Separating epithelium from the circumference. There really is no evidence of infection here however not making progress by my view 3/29; left lateral foot. Surface of the wound again looks reasonably healthy still thick skin and subcutaneous tissue around the wound margins. There is no evidence of infection. One of the concerns being brought up by the nurses has again the amount of drainage vis--vis continued use of a total contact cast 4/5; left lateral foot at roughly the base of the fifth metatarsal. Nice healthy looking granulated tissue with rims of epithelialization. The overall wound measurements are not any better but the tissue looks healthy. The only concern is the amount of drainage although he has no surrounding maceration with what we have been doing recently to absorb fluid and protect his skin. He also has lymphedema. 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 He states that the kerlix/coban under the TCC rolled down and could not get it back up. He has been using an offloading scooter and has somehow hurt his right foot using this device. This happened last week. He states that the side of his right foot developed a blister and opened. The top of his foot also has a few small open wounds he thinks is due to his socks rubbing in his shoes. He has not been using any dressings to the wound. He denies purulent drainage, fever/chills or erythema to the wounds. 4/22; patient presents for 1 week follow-up. He developed new wounds to the right foot that were evaluated at last clinic visit. He continues to have a total contact cast to the left leg and he reports no issues. He has been using silver collagen to the right foot wounds with no issues. He denies purulent drainage, fever/chills or erythema to the right foot wounds. He has no complaints today 4/25; patient presents for 1 week follow-up. He has a total contact cast of the left leg and reports no issues. He has been using silver alginate to the right foot wound. He denies purulent drainage, fever/chills or erythema to the right foot wounds. 5/2 patient presents for 1 week follow-up. T contact cast on the left. The wound which is on the base of the plantar foot at the base  of the fifth metatarsal otal actually looks quite good and dimensions continue to gradually contract. HOWEVER the area on the right lateral fifth metatarsal head is much larger than what I remember from 2 weeks ago. Once more is he has significant levels of hypergranulation. Noteworthy that he had this same hyper granulated response on his wound on the left foot at one point in time. So much so  that he I thought there was an underlying fluid collection. Based on this I think this just needs debridement. 5/9; the wound on the left actually continues to be gradually smaller with a healthy surface. Slight amount of drainage and maceration of the skin around but not too bad. However he has a large wound over the right fifth metatarsal head very much in the same configuration as his left foot wound was initially. I used silver nitrate to address the hyper granulated tissue no mechanical debridement 5/16; area on the left foot did not look as healthy this week deeper thick surrounding macerated skin and subcutaneous tissue. The area on the right foot fifth met head was about the same The area on the right ankle that we identified last week is completely broken down into an open wound presumably a stocking rubbing issue 5/23; patient has been using a total contact cast to the left side. He has been using silver alginate underneath. He has also been using silver alginate to the right foot wounds. He has no complaints today. He denies any signs of infection. 5/31; the left-sided wound looks some better measure smaller surface granulation looks better. We have been using silver alginate under the total contact cast The large area on his right fifth met head and right dorsal foot look about the same still using silver alginate 6/6; neither side is good as I was hoping although the surface area dimensions are better. A lot of maceration on his left and right foot around the wound edge. Area on the dorsal right foot looks better. He says he was traveling. I am not sure what does the amount of maceration around the plantar wounds may be drainage issues 6/13; in general the wound surfaces look quite good on both sides. Macerated skin and raised edges around the wound required debridement although in general especially on the left the surface area seems improved. The area on the right dorsal ankle is  about the same I thought this would not be such a problem to close 6/20; not much change in either wound although the one on the right looks a little better. Both wounds have thick macerated edges to the skin requiring debridements. We have been using silver alginate. The area on the dorsal right ankle is still open I thought this would be closed. 6/28; patient comes in today with a marked deterioration in the right foot wound fifth met head. Wide area of exposed bone this is a drastic change from last time. The area on the left there we have been casting is stagnant. We have been using silver alginate in both wound areas. 7/5; bone culture I did for PCR last time was positive for Pseudomonas, group B strep, Enterococcus and Staph aureus. There was no suggestion of methicillin resistance or ampicillin resistant genes. This was resistant to tetracycline however He comes into the clinic today with the area over his right plantar fifth metatarsal head which had been doing so well 2 weeks ago completely necrotic feeling bone. I do not know that this is going to be salvageable.  The left foot wound is certainly no smaller but it has a better surface and is superficial. 7/8; patient called in this morning to say that his total contact cast was rubbing against his foot. He states he is doing fine overall. He denies signs of infection. 7/12; continued deterioration in the wound over the right fifth metatarsal head crumbling bone. This is not going to be salvageable. The patient agrees and wants to be referred to Dr. Doran Durand which we will attempt to arrange as soon as possible. I am going to continue him on antibiotics as long as that takes so I will renew those today. The area on the left foot which is the base of the fifth metatarsal continues to look somewhat better. Healthy looking tissue no depth no debridement is necessary here. 7/20; the patient was kindly seen by Dr. Doran Durand of orthopedics on 10/19/2020.  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 Towards this end we have taken him out of the total contact cast on the left we will put him in a wrap with Hydrofera Blue. As I understand things surgery is planned for 7/21 7/27; patient had his surgery last Thursday. 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 Apparently everything went well we did not still disturb that today The area on the left foot actually looks quite good. He has been much less mobile which probably explains this he did not seem to do well in the total contact cast secondary to drainage and maceration I think. 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 Fortunately there is no evidence of active infection at this time. No fevers, chills, nausea, vomiting, or diarrhea. Overall I think that he is actually doing extremely well. Nonetheless I do believe that he is staying off of this more following the surgery in his right foot that is the reason the left is doing so great. 8/16; left plantar foot wound. This looks smaller than the last time I saw this he is using Hydrofera Blue. The surgical wound on the right foot is being followed by Dr. Doran Durand we did not look at this today. He has surgical shoes on both feet 8/23; left plantar foot wound not as good this week. Surrounding macerated skin and subcutaneous tissue everything looks moist and wet. I do not think he is offloading this adequately. He is using a surgical shoe Apparently the right foot surgical wound is not open although I did not check his foot 8/31; left plantar foot lateral aspect. Much improved this week. He has no maceration. Some improvement in the surface area of the wound but most impressively the depth is come in we are using silver alginate. The patient is a Garment/textile technologist. He is asked that we write him a letter so he can go back to work. I have also tried to see if we can write something that will allow him to limit the amount of time that he is on his foot at work. 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 Hopefully the latter can be avoided 9/6; I believe the patient missed an appointment last week. He arrives in today with a wound looking roughly the same  certainly no better. Undermining laterally and also inferiorly. We used molecuLight today in training with the patient's permission.. We are using silver alginate 9/21 wound is measuring bigger this week although this may have to do with the aggressive circumferential debridement last week in response to the blush fluorescence on the MolecuLight. Culture I did last week showed significant MSSA and E. coli. I put him on Augmentin but he has not started it yet. We are also going to send this for compounded antibiotics at [facility]. There is no evidence of systemic infection 9/29; silver alginate. His Keystone arrived. He is completing Augmentin in 2 days. Offloading in a cam boot. Moderate drainage per our intake staff 10/5; using silver alginate. He has been using his [place]. He has completed his Augmentin. Per our intake nurse still a lot of drainage, far too much to consider a total contact cast. Wound measures about the same. He had the same undermining area that I defined last week from a roughly 11-3. I remove this today 10/12; using silver alginate he is using the [place]. He comes in for a nurse visit hence we are applying [employer] twice a week. Measuring slightly better today and less notable drainage. Extensive debridement of the wound edge last time 10/18; using topical Keystone and silver alginate and a soft cast. Wound measurements about the same. Drainage was through his soft cast. We are changing this twice  a week Tuesdays and Friday 10/25; comes in with moderate drainage. Still using Keystone silver alginate and a soft cast. Wound dimensions completely the same.He has a lot of edema in the left leg he has lymphedema. Asking for [name] to consider wrapping him as he cannot get his stocking on over the soft cast 11/2; comes in with moderate to large drainage slightly smaller in terms of width we have been using [place]. His wound looks satisfactory but not much improvement 11/4; patient presents today for obligatory cast change. Has no issues or complaints today. He denies signs of infection. 11/9; patient traveled this weekend to [place], was on the cast quite a bit. Staining of the cast with black material from his walking boot. Drainage was not quite as bad as we feared. Using silver alginate and Keystone 11/16; we do not have size for cast therefore we have been putting a soft cast on him since the change on Friday. Still a significant amount of drainage necessitating changing twice a week. We have been using the Keystone at cast changes either hard or soft as well as silver alginate Comes in the clinic with things actually looking fairly good improvement in width. He says his offloading is about the same 02/24/2021 upon evaluation today patient actually comes back in and is doing excellent in regard to his foot ulcer this is significantly smaller even compared to the last visit. The soft cast seems to have done extremely well for him which is great news. I do not see any signs of infection minimal debridement will be needed today. 11/30; left lateral foot much improved half a centimeter improvement in surface area. No evidence of infection. He seems to be doing better with the soft cast in the TCC therefore we will continue with this. He comes back in later in the week for a change with the nurses. This is due to drainage 12/6; no improvement in dimensions. Under illumination some debris on the surface we  have been using silver alginate, soft cast. If there is anything optimistic here he seems to have  have less drainage 12/13. Dimensions are improved both length and width and slightly in depth. Appears to be quite healthy today. Raised edges of this thick skin and callus around the edges however. He is in a soft cast were bringing him back once for a change on Friday. Drainage is better 12/20. Dimensions are improved. He still has raised edges of thick skin and callus around the edges. We are using a soft cast 12/28; comes in today with thick callus around the wound. Using silver under alginate under a soft cast. I do not think there is much improvement in any measurement 2023 04/06/2021; patient was put in a total contact cast. Unfortunately not much change in surface area 1/10; not much different still thick callus and skin around the edge in spite of the total contact cast. This was just debrided last week we have been using the [facility] compounded antibiotic and silver alginate under a total contact cast 1/18 the patient's wound on the left side is doing nicely. smaller HOWEVER he comes in today with a wound on the left foot laterally. blister most likely serosangquenous drainage Electronic Signature(s) Signed: 04/21/2021 3:43:46 PM By: [name] MD Entered By: [name] on 04/21/2021 08:33:39 -------------------------------------------------------------------------------- Physical Exam Details Patient Name: Date of Service: [name], [name] 04/21/2021 7:45 A M Medical Record Number: [id number] Patient Account Number: [id number] Date of Birth/Sex: Treating RN: 12-Aug-1985 (36 y.o. [name]: [name] Other Clinician: Referring Provider: Treating Provider/Extender: [name] in Treatment: 77 Constitutional Sitting or standing Blood Pressure is within target range for patient.. Pulse regular and within target range  for patient.Marland Kitchen Respirations regular, non-labored and within target range.. Temperature is normal and within the target range for the patient.Marland Kitchen Appears in no distress. Notes wounds exam; patients left foot looks better. smaller not debridement HOWEVER new area on the right lateral foot. draining removed denuded skin with pickups and Scissors. necrotic tissue removed with #5 currette. Hemostasis with direct5 pressure Electronic Signature(s) Signed: 04/21/2021 3:43:46 PM By: [name] MD Entered By: [name] on 04/21/2021 08:36:25 -------------------------------------------------------------------------------- Physician Orders Details Patient Name: Date of Service: [name], [name] 04/21/2021 7:45 A M Medical Record Number: [id number] Patient Account Number: [id number] Date of Birth/Sex: Treating RN: 01-Sep-1985 (36 y.o. 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 - Dr. Dellia Nims Return A Bathing/ Shower/ Hygiene May shower with protection but do not get wound dressing(s) wet. - Use Cast Protector Bag Edema Control - Lymphedema / SCD / Other Bilateral Lower Extremities Elevate legs to the level of the heart or above for 30 minutes daily and/or when sitting, a frequency of: - throughout the day Avoid standing for long periods of time. Exercise regularly Moisturize legs daily. - right leg every night before bed. Compression stocking or Garment 20-30 mm/Hg pressure to: - Apply to right leg in the  morning and remove at night. Off-Loading Total Contact Cast to Left Lower Extremity Open toe surgical shoe to: - right foot Other: - minimal weight bearing left foot Additional Orders / Instructions Follow Nutritious Diet  Wound Treatment Wound #3 - Foot Wound Laterality: Left, Lateral Cleanser: Soap and Water 1 x Per Week/7 Days Discharge Instructions: May shower and wash wound with dial antibacterial soap and water prior to dressing change. Topical: keystone 1 x Per Week/7 Days Prim Dressing: KerraCel Ag Gelling Fiber Dressing, 4x5 in (silver alginate) 1 x Per Week/7 Days ary Discharge Instructions: Apply silver alginate to wound bed as instructed Secondary Dressing: Woven Gauze Sponge, Non-Sterile 4x4 in 1 x Per Week/7 Days Discharge Instructions: Apply over primary dressing as directed. Secondary Dressing: Optifoam Non-Adhesive Dressing, 4x4 in 1 x Per Week/7 Days Discharge Instructions: Apply over primary dressing as directed. Compression Wrap: Kerlix Roll 4.5x3.1 (in/yd) 1 x Per Week/7 Days Discharge Instructions: Apply Kerlix and Coban compression as directed. Compression Wrap: Coban Self-Adherent Wrap 4x5 (in/yd) 1 x Per Week/7 Days Discharge Instructions: Apply over Kerlix as directed. Wound #8 - Foot Wound Laterality: Right, Lateral Cleanser: Wound Cleanser (DME) (Generic) 1 x Per Day/15 Days Discharge Instructions: Cleanse the wound with wound cleanser prior to applying a clean dressing using gauze sponges, not tissue or cotton balls. Prim Dressing: KerraCel Ag Gelling Fiber Dressing, 4x5 in (silver alginate) (DME) (Generic) 1 x Per Day/15 Days ary Discharge Instructions: Apply silver alginate to wound bed as instructed Secondary Dressing: Woven Gauze Sponge, Non-Sterile 4x4 in (DME) (Generic) 1 x Per Day/15 Days Discharge Instructions: Apply over primary dressing as directed. Secondary Dressing: Optifoam Non-Adhesive Dressing, 4x4 in (DME) (Generic) 1 x Per Day/15  Days Discharge Instructions: Apply over primary dressing as directed. Secured With: [employer], 4.5x3.1 (in/yd) (DME) (Generic) 1 x Per Day/15 Days Discharge Instructions: Secure with Kerlix as directed. Secured With: 66M Medipore H Soft Cloth Surgical Tape, 4 x 10 (in/yd) (DME) (Generic) 1 x Per Day/15 Days Discharge Instructions: Secure with tape as directed. Laboratory naerobe culture (MICRO) - right lat. foot Bacteria identified in Unspecified specimen by A LOINC Code: 101-7 Convenience Name: Anerobic culture Electronic Signature(s) Signed: 04/21/2021 3:43:46 PM By: [name] MD Signed: 04/21/2021 4:57:53 PM By: Rhae Hammock RN Entered By: Rhae Hammock on 04/21/2021 12:11:52 -------------------------------------------------------------------------------- Problem List Details Patient Name: Date of Service: [name], [name] 04/21/2021 7:45 A M Medical Record Number: [id number] Patient Account Number: [id number] Date of Birth/Sex: Treating RN: 1985/07/24 (36 y.o. [name], [name] Primary Care Provider: [name] Other Clinician: Referring Provider: Treating Provider/Extender: [name] in Treatment: 77 Active Problems ICD-10 Encounter Code Description Active Date MDM Diagnosis E11.621 Type 2 diabetes mellitus with foot ulcer 10/24/2019 No Yes L97.528 Non-pressure chronic ulcer of other part of left foot with other specified 10/24/2019 No Yes severity L97.518 Non-pressure chronic ulcer of other part of right foot with other specified 10/24/2019 No Yes severity Inactive Problems ICD-10 Code Description Active Date Inactive Date L97.518 Non-pressure chronic ulcer of other part of right foot with other specified severity 07/14/2020 07/14/2020 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, right ankle and foot 10/24/2019 10/24/2019 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 08/10/2020 08/10/2020 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 He comes in for review of a wound over the plantar fifth metatarsal head extending into the lateral part of the foot. He was followed for this previously by his podiatrist Dr. Cornelius Moras. As the patient tells his story he went to see podiatry first for a swelling he developed on the lateral part of his fifth metatarsal head in May. He states this was "open" by podiatry and the area closed. He was followed up in June and it was again opened callus removed and it closed promptly. There were plans being made for surgery on the fifth metatarsal head in June however his blood sugar was apparently too high for anesthesia. Apparently the area was debrided and opened again in June and it is never closed since. Looking over the records from podiatry I am really not able to follow this. It was clear when he was first seen it was before 5/14 at that point he already had a wound. By 5/17 the ulcer was resolved. I do not see anything about a procedure.  On 5/28 noted to have pre-ulcerative moderate keratosis. X-ray noted 1/5 contracted toe and tailor's bunion and metatarsal deformity. On a visit date on 09/28/2018 the dorsal part of the left foot it healed and resolved. There was concern about swelling in his lower extremity he was sent to the ER.. As far as I can tell he was seen in the ER on 7/12 with an ulcer on his left foot. A DVT rule out of the left leg was negative. I do not think I have complete records from podiatry but I am not able to verify the procedures this patient states he had. He states after the last procedure the wound has never closed although I am not able to follow this in the records I have from podiatry. He has not had a recent x-ray The patient has been using Neosporin on the wound. He is wearing a Darco shoe. He is still very active up on his foot working and exercising. Past medical history; type 2 diabetes ketosis-prone, leg swelling with a negative DVT study in July. Non-smoker ABI in our clinic was 0.85 on the left 10/16; substantial wound on the plantar left fifth met head extending laterally almost to the dorsal fifth MTP. We have been using silver alginate we gave him a Darco forefoot off loader. An x-ray did not show evidence of osteomyelitis did note soft tissue emphysema which I think was due to gas tracking through an open wound. There is no doubt in my mind he requires an MRI 10/23; MRI not booked until 3 November at the earliest this is largely due to his glucose sensor in the right arm. We have been using silver alginate. There has been an improvement 10/29; I am still not exactly sure when his MRI is booked for. He says it is the third but it is the 10th in epic. This definitely needs to be done. He is running a low-grade fever today but no other symptoms. No real improvement in the 1 02/26/2019 patient presents today for a follow-up visit here in our clinic he is last been seen in the clinic on October 29.  Subsequently we were working on getting MRI to evaluate and see what  exactly was going on and where we would need to go from the standpoint of whether or not he had osteomyelitis and again what treatments were going be required. Subsequently the patient ended up being admitted to the hospital on 02/07/2019 and was discharged on 02/14/2019. This is a somewhat interesting admission with a discharge diagnosis of pneumonia due to COVID-19 although he was positive for COVID-19 when tested at the urgent care but negative x2 when he was actually in the hospital. With that being said he did have acute respiratory failure with hypoxia and it was noted he also have a left foot ulceration with osteomyelitis. With that being said he did require oxygen for his pneumonia and I level 4 L. He was placed on antivirals and steroids for the COVID-19. He was also transferred to the [facility] at one point. Nonetheless he did subsequently discharged home and since being home has done much better in that regard. The CT angiogram did not show any pulmonary embolism. With regard to the osteomyelitis the patient was placed on vancomycin and Zosyn while in the hospital but has been changed to Augmentin at discharge. It was also recommended that he follow- up with wound care and podiatry. Podiatry however wanted him to see [name] according to the patient prior to them doing anything further. His hemoglobin A1c was 9.9 as noted in the hospital. Have an MRI of the left foot performed while in the hospital on 02/04/2019. 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 There is an overlying plantar open wound noted an abscess tracking back along the lateral aspect of the fifth metatarsal shaft. There is otherwise diffuse cellulitis and mild fasciitis without findings of polymyositis. The patient did have recently pneumonia secondary to  COVID-19 I looked in the chart through epic and it does appear that the patient may need to have an additional x-ray just to ensure everything is cleared and that he has no airspace disease prior to putting him into the [name]. 03/05/2019; patient was readmitted to the clinic last week. He was hospitalized twice for a viral upper respiratory tract infection from 11/1 through 11/4 and then 11/5 through 11/12 ultimately this turned out to be Covid pneumonitis. Although he was discharged on oxygen he is not using it. He says he feels fine. He has no exercise limitation no cough no sputum. His O2 sat in our clinic today was 100% on room air. 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 He received Vanco and Zosyn in the hospital and then was discharged on 2 weeks of Augmentin. I do not see any relevant cultures. He was supposed to follow-up with infectious disease but I do not see that he has an appointment. 12/8; patient saw Dr. Novella Olive of infectious disease last week. He felt that he had had adequate antibiotic therapy. He did not go to follow-up with Dr. Amalia Hailey of podiatry and I have again talked to him about the pros and cons of this. He does not want to consider a ray amputation of this time. He is aware of the risks of recurrence, migration etc. He started HBO today and tolerated this well. He can complete the Augmentin that I gave him last week. I have looked over the lab work that Dr. Chana Bode ordered his C-reactive protein was 3.3 and  his sedimentation rate was 17. The C-reactive protein is never really been measurably that high in this patient 12/15; not much change in the wound today however he has undermining along the lateral part of the foot again more extensively than last week. He has some rims of epithelialization. We have been using silver alginate. He is undergoing hyperbarics  but did not dive today 12/18; in for his obligatory first total contact cast change. Unfortunately there was pus coming from the undermining area around his fifth metatarsal head. This was cultured but will preclude reapplication of a cast. He is seen in conjunction with HBO 12/24; patient had staph lugdunensis in the wound in the undermining area laterally last time. We put him on doxycycline which should have covered this. The wound looks better today. I am going to give him another week of doxycycline before reattempting the total contact cast 12/31; the patient is completing antibiotics. Hemorrhagic debris in the distal part of the wound with some undermining distally. He also had hyper granulation. Extensive debridement with a #5 curette. The infected area that was on the lateral part of the fifth met head is closed over. I do not think he needs any more antibiotics. Patient was seen prior to HBO. Preparations for a total contact cast were made in the cast will be placed post hyperbarics 04/11/19; once again the patient arrives today without complaint. He had been in a cast all week noted that he had heavy drainage this week. This resulted in large raised areas of macerated tissue around the wound 1/14; wound bed looks better slightly smaller. Hydrofera Blue has been changing himself. He had a heavy drainage last week which caused a lot of maceration around the wound so I took him out of a total contact cast he says the drainage is actually better this week He is seen today in conjunction with HBO 1/21; returns to clinic. He was up in [place] for a day or 2 attending a funeral. He comes back in with the wound larger and with a large area of exposed bone. He had osteomyelitis and septic arthritis of the fifth left metatarsal head while he was in hospital. He received IV antibiotics in the hospital for a prolonged period of time then 3 weeks of Augmentin. Subsequently I gave him 2 weeks of  doxycycline for more superficial wound infection. When I saw this last week the wound was smaller the surface of the wound looks satisfactory. 1/28; patient missed hyperbarics today. Bone biopsy I did last time showed Enterococcus faecalis and Staphylococcus lugdunensis . He has a wide area of exposed bone. We are going to use silver alginate as of today. I had another ethical discussion with the patient. 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 In this situation I think the likelihood of healing this is low. Therefore I have recommended a ray amputation and with the patient's agreement I have referred him to Dr. Doran Durand. The other issue is that his compliance with hyperbarics has been minimal because of his work schedule and given his underlying decision I am going to stop this today READMISSION 10/24/2019 MRI 09/29/2019 left foot IMPRESSION: 1. Apparent skin ulceration inferior and lateral to the 5th metatarsal base with underlying heterogeneous T2 signal and enhancement in the subcutaneous fat. Small peripherally enhancing fluid collections along the plantar and lateral aspects of the 5th metatarsal base suspicious for abscesses. 2. Interval amputation through the mid 5th metatarsal with nonspecific low-level marrow edema and  enhancement. Given the proximity to the adjacent soft tissue inflammatory changes, osteomyelitis cannot be excluded. 3. The additional bones appear unremarkable. MRI 09/29/2019 right foot IMPRESSION: 1. Soft tissue ulceration lateral to the 5th MTP joint. There is low-level T2 hyperintensity within the 4th and 5th metatarsal heads and adjacent proximal phalanges without abnormal T1 signal or cortical destruction. These findings are nonspecific and could be seen with early marrow edema, hyperemia or early osteomyelitis. No evidence of septic joint. 2. Mild tenosynovitis and synovial enhancement associated with the extensor  digitorum tendons at the level of the midfoot. 3. Diffuse low-level muscular T2 hyperintensity and enhancement, most consistent with diabetic myopathy. LEFT FOOT BONE Methicillin resistant staphylococcus aureus Staphylococcus lugdunensis MIC MIC CIPROFLOXACIN >=8 RESISTANT Resistant <=0.5 SENSI... Sensitive CLINDAMYCIN <=0.25 SENS... Sensitive >=8 RESISTANT Resistant ERYTHROMYCIN >=8 RESISTANT Resistant >=8 RESISTANT Resistant GENTAMICIN <=0.5 SENSI... Sensitive <=0.5 SENSI... Sensitive Inducible Clindamycin NEGATIVE Sensitive NEGATIVE Sensitive OXACILLIN >=4 RESISTANT Resistant 2 SENSITIVE Sensitive RIFAMPIN <=0.5 SENSI... Sensitive <=0.5 SENSI... Sensitive TETRACYCLINE <=1 SENSITIVE Sensitive <=1 SENSITIVE Sensitive TRIMETH/SULFA <=10 SENSIT Sensitive <=10 SENSIT Sensitive ... Marland Kitchen.. VANCOMYCIN 1 SENSITIVE Sensitive <=0.5 SENSI... Sensitive Right foot bone . Component 3 wk ago Specimen Description BONE Special Requests RIGHT 4 METATARSAL SAMPLE B Gram Stain NO WBC SEEN NO ORGANISMS SEEN Culture RARE METHICILLIN RESISTANT STAPHYLOCOCCUS AUREUS NO ANAEROBES ISOLATED Performed at [facility], [place] [address] [place], [place] [address] Report Status 10/08/2019 FINAL Organism ID, Bacteria METHICILLIN RESISTANT STAPHYLOCOCCUS AUREUS Resulting Agency [facility] Susceptibility Methicillin resistant staphylococcus aureus MIC CIPROFLOXACIN >=8 RESISTANT Resistant CLINDAMYCIN <=0.25 SENS... Sensitive ERYTHROMYCIN >=8 RESISTANT Resistant GENTAMICIN <=0.5 SENSI... Sensitive Inducible Clindamycin NEGATIVE Sensitive OXACILLIN >=4 RESISTANT Resistant RIFAMPIN <=0.5 SENSI... Sensitive TETRACYCLINE <=1 SENSITIVE Sensitive TRIMETH/SULFA <=10 SENSIT Sensitive ... VANCOMYCIN 1 SENSITIVE Sensitive This is a patient we had in clinic earlier this year with a wound over his left fifth metatarsal head. 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 Doran Durand on 05/16/2019. He seems to have developed problems with wounds on his bilateral feet in June 2021 although he may have had problems earlier than this. He was in an urgent care with a right foot ulcer on 09/26/2019 and given a course of doxycycline. This was apparently after having trouble getting into see orthopedics. He was seen by podiatry on 09/28/2019 noted to have bilateral lower extremity ulcers including the left lateral fifth metatarsal base and the right subfifth met head. It was noted that had purulent drainage at that time. He required hospitalization from 6/20 through 7/2. This was because of worsening right foot wounds. He underwent bilateral operative incision and drainage and bone biopsies bilaterally. Culture results are listed above. He has been referred back to clinic by Dr. Jacqualyn Posey of podiatry. He is also followed by Dr. Megan Salon who saw him yesterday. He was discharged from hospital on Zyvox Flagyl and Levaquin and yesterday changed to doxycycline Flagyl and Levaquin. His inflammatory markers on 6/26 showed a sedimentation rate of 129 and a C-reactive protein of 5. This is improved to 14 and 1.3 respectively. This would indicate improvement. ABIs in our clinic today were 1.23 on the right and 1.20 on the left 11/01/2019 on evaluation today patient appears to be doing fairly well in regard to  the wounds on his feet at this point. Fortunately there is no signs of active infection at this time. No fevers, chills, nausea, vomiting, or diarrhea. He currently is seeing infectious disease and still under their care at this point. Subsequently he also has both wounds which she has not been using collagen on as he did not receive that in his  packaging he did not call us and let us know that. Apparently that just was missed on the order. Nonetheless we will get that straightened out today. 8/9-Patient returns for bilateral foot wounds, using Prisma with hydrogel moistened dressings, and the wounds appear stable. Patient using surgical shoes, avoiding much pressure or weightbearing as much as possible 8/16; patient has bilateral foot wounds. 1 on the right lateral foot proximally the other is on the left mid lateral foot. Both required debridement of callus and thick skin around the wounds. We have been using silver collagen 8/27; patient has bilateral lateral foot wounds. The area on the left substantially surrounded by callus and dry skin. This was removed from the wound edge. The underlying wound is small. The area on the right measured somewhat smaller today. 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 Unfortunately I did not update his antibiotics during today's visit. 9/10 I reviewed Dr. Hale Bogus last notes he felt he had completed antibiotics his inflammatory markers were reasonably well controlled. He has a small wound on the lateral left foot and a tiny area on the right which is just above closed. He is using Hydrofera Blue with border foam he has bilateral surgical shoes 9/24; 2 week f/u. doing well. right foot is closed. left foot still undermined. 10/14; right foot remains closed at the fifth met head. The area over the base of the left fifth metatarsal has a small open area but considerable undermining towards the plantar foot. Thick callus skin around this suggests an adequate pressure relief. We have talked about this. He says he is going to go back into his cam boot. I suggested a total contact cast he did not seem enamored with this suggestion 10/26; left foot base of the fifth metatarsal. Same condition as last time. He has skin over the area with an  open wound however the skin is not adherent. He went to see Dr. Earleen Newport who did an x-ray and culture of his foot I have not reviewed the x-ray but the patient was not told anything. He is on doxycycline 11/11; since the patient was last here he was in the emergency room on 10/30 he was concerned about swelling in the left foot. They did not do any cultures or x-rays. They changed his antibiotics to cephalexin. Previous culture showed group B strep. The cephalexin is appropriate as doxycycline has less than predictable coverage. Arrives in clinic today with swelling over this area under the wound. He also has a new wound on the right fifth metatarsal head 11/18; the patient has a difficult wound on the lateral aspect of the left fifth metatarsal head. The wound was almost ballotable last week I opened it slightly expecting to see purulence however there was just bleeding. I cultured this this was negative. X-ray unchanged. We are trying to get an MRI but I am not sure were going to be able to get this through his insurance. He also has an area on the right lateral fifth metatarsal head this looks healthier 12/3; the patient finally got our MRI. Surprisingly  this did not show osteomyelitis. I did show the soft tissue ulceration at the lateral plantar aspect of the fifth metatarsal base with a tiny residual 6 mm abscess overlying the superficial fascia I have tried to culture this area I have not been able to get this to grow anything. Nevertheless the protruding tissue looks aggravated. I suspect we should try to treat the underlying "abscess with broad-spectrum antibiotics. I am going to start him on Levaquin and Flagyl. He has much less edema in his legs and I am going to continue to wrap his legs and see him weekly 12/10. I started Levaquin and Flagyl on him last week. He just picked up the Flagyl apparently there was some delay. The worry is the wound on the left fifth metatarsal base which is  substantial and worsening. His foot looks like he inverts at the ankle making this a weightbearing surface. Certainly no improvement in fact I think the measurements of this are somewhat worse. We have been using 12/17; he apparently just got the Levaquin yesterday this is 2 weeks after the fact. He has completed the Flagyl. The area over the left fifth metatarsal base still has protruding granulation tissue although it does not look quite as bad as it did some weeks ago. He has severe bilateral lymphedema although we have not been treating him for wounds on his legs this is definitely going to require compression. There was so much edema in the left I did not wish to put him in a total contact cast today. I am going to increase his compression from 3-4 layer. The area on the right lateral fifth met head actually look quite good and superficial. 12/23; patient arrived with callus on the right fifth met head and the substantial hyper granulated callused wound on the base of his fifth metatarsal. He says he is completing his Levaquin in 2 days but I do not think that adds up with what I gave him but I will have to double check this. We are using Hydrofera Blue on both areas. My plan is to put the left leg in a cast the week after New Year's 04/06/2020; patient's wounds about the same. Right lateral fifth metatarsal head and left lateral foot over the base of the fifth metatarsal. There is undermining on the left lateral foot which I removed before application of total contact cast continuing with Hydrofera Blue new. Patient tells me he was seen by endocrinology today lab work was done [Dr. Kerr]. Also wondering whether he was referred to cardiology. I went over some lab work from previously does not have chronic renal failure certainly not nephrotic range proteinuria he does have very poorly controlled diabetes but this is not his most updated lab work. Hemoglobin A1c has been over 11 1/10; the patient  had a considerable amount of leakage towards mid part of his left foot with macerated skin however the wound surface looks better the area on the right lateral fifth met head is better as well. I am going to change the dressing on the left foot under the total contact cast to silver alginate, continue with Hydrofera Blue on the right. 1/20; patient was in the total contact cast for 10 days. Considerable amount of drainage although the skin around the wound does not look too bad on the left foot. The area on the right fifth metatarsal head is closed. Our nursing staff reports large amount of drainage out of the left lateral foot wound 1/25; continues with  copious amounts of drainage described by our intake staff. PCR culture I did last week showed E. coli and Enterococcus faecalis and low quantities. Multiple resistance genes documented including extended spectrum beta lactamase, MRSA, MRSE, quinolone, tetracycline. The wound is not quite as good this week as it was 5 days ago but about the same size 2/3; continues with copious amounts of malodorous drainage per our intake nurse. The PCR culture I did 2 weeks ago showed E. coli and low quantities of Enterococcus. There were multiple resistance genes detected. I put Neosporin on him last week although this does not seem to have helped. The wound is slightly deeper today. Offloading continues to be an issue here although with the amount of drainage she has a total contact cast is just not going to work 2/10; moderate amount of drainage. Patient reports he cannot get his stocking on over the dressing. I told him we have to do that the nurse gave him suggestions on how to make this work. The wound is on the bottom and lateral part of his left foot. Is cultured predominantly grew low amounts of Enterococcus, E. coli and anaerobes. There were multiple resistance genes detected including extended spectrum beta lactamase, quinolone, tetracycline. I could not  think of an easy oral combination to address this so for now I am going to do topical antibiotics provided by [facility] I think the main agents here are vancomycin and an aminoglycoside. We have to be able to give him access to the wounds to get the topical antibiotic on 2/17; moderate amount of drainage this is unchanged. He has his Keystone topical antibiotic against the deep tissue culture organisms. He has been using this and changing the dressing daily. Silver alginate on the wound surface. 2/24; using Keystone antibiotic with silver alginate on the top. He had too much drainage for a total contact cast at one point although I think that is improving and I think in the next week or 2 it might be possible to replace a total contact cast I did not do this today. In general the wound surface looks healthy however he continues to have thick rims of skin and subcutaneous tissue around the wide area of the circumference which I debrided 06/04/2020 upon evaluation today patient appears to be doing well in regard to his wound. I do feel like he is showing signs of improvement. There is little bit of callus and dead tissue around the edges of the wound as well as what appears to be a little bit of a sinus tract that is off to the side laterally I would perform debridement to clear that away today. 3/17; left lateral foot. The wound looks about the same as I remember. Not much depth surface looks healthy. No evidence of infection 3/25; left lateral foot. Wound surface looks about the same. Separating epithelium from the circumference. There really is no evidence of infection here however not making progress by my view 3/29; left lateral foot. Surface of the wound again looks reasonably healthy still thick skin and subcutaneous tissue around the wound margins. There is no evidence of infection. One of the concerns being brought up by the nurses has again the amount of drainage vis--vis continued use of a  total contact cast 4/5; left lateral foot at roughly the base of the fifth metatarsal. Nice healthy looking granulated tissue with rims of epithelialization. The overall wound measurements are not any better but the tissue looks healthy. The only concern is the amount  of drainage although he has no surrounding maceration with what we have been doing recently to absorb fluid and protect his skin. He also has lymphedema. 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 He states that the kerlix/coban under the TCC rolled down and could not get it back up. He has been using an offloading scooter and has somehow hurt his right foot using this device. This happened last week. He states that the side of his right foot developed a blister and opened. The top of his foot also has a few small open wounds he thinks is due to his socks rubbing in his shoes. He has not been using any dressings to the wound. He denies purulent drainage, fever/chills or erythema to the wounds. 4/22; patient presents for 1 week follow-up. He developed new wounds to the right foot that were evaluated at last clinic visit. He continues to have a total contact cast to the left leg and he reports no issues. He has been using silver collagen to the right foot wounds with no issues. He denies purulent drainage, fever/chills or erythema to the right foot wounds. He has no complaints today 4/25; patient presents for 1 week follow-up. He has a total contact cast of the left leg and reports no issues. He has been using silver alginate to the right foot wound. He denies purulent drainage, fever/chills or erythema to the right foot wounds. 5/2 patient presents for 1 week follow-up. T contact cast on the  left. The wound which is on the base of the plantar foot at the base of the fifth metatarsal otal actually looks quite good and dimensions continue to gradually contract. HOWEVER the area on the right lateral fifth metatarsal head is much larger than what I remember from 2 weeks ago. Once more is he has significant levels of hypergranulation. Noteworthy that he had this same hyper granulated response on his wound on the left foot at one point in time. So much so that he I thought there was an underlying fluid collection. Based on this I think this just needs debridement. 5/9; the wound on the left actually continues to be gradually smaller with a healthy surface. Slight amount of drainage and maceration of the skin around but not too bad. However he has a large wound over the right fifth metatarsal head very much in the same configuration as his left foot wound was initially. I used silver nitrate to address the hyper granulated tissue no mechanical debridement 5/16; area on the left foot did not look as healthy this week deeper thick surrounding macerated skin and subcutaneous tissue. oo The area on the right foot fifth met head was about the same oo The area on the right ankle that we identified last week is completely broken down into an open wound presumably a stocking rubbing issue 5/23; patient has been using a total contact cast to the left side. He has been using silver alginate underneath. He has also been using silver alginate to the right foot wounds. He has no complaints today. He denies any signs of infection. 5/31; the left-sided wound looks some better measure smaller surface granulation looks better. We have been using silver alginate under the total contact cast oo  The large area on his right fifth met head and right dorsal foot look about the same still using silver alginate 6/6; neither side is good as I was hoping although the surface area dimensions are better. A lot of  maceration on his left and right foot around the wound edge. Area on the dorsal right foot looks better. He says he was traveling. I am not sure what does the amount of maceration around the plantar wounds may be drainage issues 6/13; in general the wound surfaces look quite good on both sides. Macerated skin and raised edges around the wound required debridement although in general especially on the left the surface area seems improved. oo The area on the right dorsal ankle is about the same I thought this would not be such a problem to close 6/20; not much change in either wound although the one on the right looks a little better. Both wounds have thick macerated edges to the skin requiring debridements. We have been using silver alginate. The area on the dorsal right ankle is still open I thought this would be closed. 6/28; patient comes in today with a marked deterioration in the right foot wound fifth met head. Wide area of exposed bone this is a drastic change from last time. The area on the left there we have been casting is stagnant. We have been using silver alginate in both wound areas. 7/5; bone culture I did for PCR last time was positive for Pseudomonas, group B strep, Enterococcus and Staph aureus. There was no suggestion of methicillin resistance or ampicillin resistant genes. This was resistant to tetracycline however He comes into the clinic today with the area over his right plantar fifth metatarsal head which had been doing so well 2 weeks ago completely necrotic feeling bone. I do not know that this is going to be salvageable. The left foot wound is certainly no smaller but it has a better surface and is superficial. 7/8; patient called in this morning to say that his total contact cast was rubbing against his foot. He states he is doing fine overall. He denies signs of infection. 7/12; continued deterioration in the wound over the right fifth metatarsal head crumbling bone. This  is not going to be salvageable. The patient agrees and wants to be referred to Dr. Doran Durand which we will attempt to arrange as soon as possible. I am going to continue him on antibiotics as long as that takes so I will renew those today. The area on the left foot which is the base of the fifth metatarsal continues to look somewhat better. Healthy looking tissue no depth no debridement is necessary here. 7/20; the patient was kindly seen by Dr. Doran Durand of orthopedics on 10/19/2020. 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 Towards this end we have taken him out of the total contact cast on the left we will put him in a wrap with Hydrofera Blue. As I understand things surgery is planned for 7/21 7/27; patient had his surgery last Thursday. 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 Apparently everything went well we did not still disturb that today The area on the left foot actually looks quite good. He has been much less mobile which probably explains this he did not seem to do well in the total contact cast secondary to drainage and maceration I think. We have been  using Hydrofera Blue 11/09/2020 upon evaluation today patient appears to be doing well with regard to his plantar foot ulcer on the left foot. Fortunately there is no evidence of active infection at this time. No fevers, chills, nausea, vomiting, or diarrhea. Overall I think that he is actually doing extremely well. Nonetheless I do believe that he is staying off of this more following the surgery in his right foot that is the reason the left is doing so great. 8/16; left plantar foot wound. This looks smaller than the last time I saw this he is using Hydrofera Blue. The surgical wound on the right foot is being followed by Dr. Doran Durand we did not look at this today. He has surgical shoes on both feet 8/23; left plantar foot wound not as good this week.  Surrounding macerated skin and subcutaneous tissue everything looks moist and wet. I do not think he is offloading this adequately. He is using a surgical shoe Apparently the right foot surgical wound is not open although I did not check his foot 8/31; left plantar foot lateral aspect. Much improved this week. He has no maceration. Some improvement in the surface area of the wound but most impressively the depth is come in we are using silver alginate. The patient is a Product/process development scientist. He is asked that we write him a letter so he can go back to work. I have also tried to see if we can write something that will allow him to limit the amount of time that he is on his foot at work. 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 Hopefully the latter can be avoided 9/6; I believe the patient missed an appointment last week. He arrives in today with a wound looking roughly the same certainly no better. Undermining laterally and also inferiorly. We used molecuLight today in training with the patient's permission.. We are using silver alginate 9/21 wound is measuring bigger this week although this may have to do with the aggressive circumferential debridement last week in response to the blush fluorescence on the MolecuLight. Culture I did last week showed significant MSSA and E. coli. I put him on Augmentin but he has not started it yet. We are also going to send this for compounded antibiotics at [facility]. There is no evidence of systemic infection 9/29; silver alginate. His Keystone arrived. He is completing Augmentin in 2 days. Offloading in a cam boot. Moderate drainage per our intake staff 10/5; using silver alginate. He has been using his [place]. He has completed his Augmentin. Per our intake nurse still a lot of drainage, far too much to consider a total contact cast. Wound measures about the same. He had the same  undermining area that I defined last week from a roughly 11-3. I remove this today 10/12; using silver alginate he is using the [place]. He comes in for a nurse visit hence we are applying [employer] twice a week. Measuring slightly better today and less notable drainage. Extensive debridement of the wound edge last time 10/18; using topical Keystone and silver alginate and a soft cast. Wound measurements about the same. Drainage was through his soft cast. We are changing this twice a week Tuesdays and Friday 10/25; comes in with moderate drainage. Still using Keystone silver alginate and a soft cast. Wound dimensions completely the same.He has a lot of edema in the left leg he has lymphedema. Asking  for [name] to consider wrapping him as he cannot get his stocking on over the soft cast 11/2; comes in with moderate to large drainage slightly smaller in terms of width we have been using [place]. His wound looks satisfactory but not much improvement 11/4; patient presents today for obligatory cast change. Has no issues or complaints today. He denies signs of infection. 11/9; patient traveled this weekend to [place], was on the cast quite a bit. Staining of the cast with black material from his walking boot. Drainage was not quite as bad as we feared. Using silver alginate and Keystone 11/16; we do not have size for cast therefore we have been putting a soft cast on him since the change on Friday. Still a significant amount of drainage necessitating changing twice a week. We have been using the Keystone at cast changes either hard or soft as well as silver alginate Comes in the clinic with things actually looking fairly good improvement in width. He says his offloading is about the same 02/24/2021 upon evaluation today patient actually comes back in and is doing excellent in regard to his foot ulcer this is significantly smaller even compared to the last visit. The soft cast seems to have done extremely well  for him which is great news. I do not see any signs of infection minimal debridement will be needed today. 11/30; left lateral foot much improved half a centimeter improvement in surface area. No evidence of infection. He seems to be doing better with the soft cast in the TCC therefore we will continue with this. He comes back in later in the week for a change with the nurses. This is due to drainage 12/6; no improvement in dimensions. Under illumination some debris on the surface we have been using silver alginate, soft cast. If there is anything optimistic here he seems to have have less drainage 12/13. Dimensions are improved both length and width and slightly in depth. Appears to be quite healthy today. Raised edges of this thick skin and callus around the edges however. He is in a soft cast were bringing him back once for a change on Friday. Drainage is better 12/20. Dimensions are improved. He still has raised edges of thick skin and callus around the edges. We are using a soft cast 12/28; comes in today with thick callus around the wound. Using silver under alginate under a soft cast. I do not think there is much improvement in any measurement 2023 04/06/2021; patient was put in a total contact cast. Unfortunately not much change in surface area 1/10; not much different still thick callus and skin around the edge in spite of the total contact cast. This was just debrided last week we have been using the [facility] compounded antibiotic and silver alginate under a total contact cast 1/18 the patient's wound on the left side is doing nicely. smaller HOWEVER he comes in today with a wound on the left foot laterally. blister most likely serosangquenous drainage Objective Constitutional Sitting or standing Blood Pressure is within target range for patient.. Pulse regular and within target range for patient.Marland Kitchen Respirations regular, non-labored and within target range.. Temperature is normal and  within the target range for the patient.Marland Kitchen Appears in no distress. Vitals Time Taken: 8:00 AM, Height: 77 in, Weight: 280 lbs, BMI: 33.2, Temperature: 98.6 F, Pulse: 91 bpm, Respiratory Rate: 18 breaths/min, Blood Pressure: 138/89 mmHg. General Notes: wounds exam; patients left foot looks better. smaller not debridement HOWEVER new area on the right  lateral foot. draining removed denuded skin with pickups and Scissors. necrotic tissue removed with #5 currette. Hemostasis with direct5 pressure Integumentary (Hair, Skin) Wound #3 status is Open. Original cause of wound was Trauma. The date acquired was: 10/02/2019. The wound has been in treatment 77 weeks. The wound is located on the Left,Lateral Foot. The wound measures 0.5cm length x 0.9cm width x 0.2cm depth; 0.353cm^2 area and 0.071cm^3 volume. There is Fat Layer (Subcutaneous Tissue) exposed. There is no tunneling or undermining noted. There is a medium amount of serosanguineous drainage noted. The wound margin is thickened. There is large (67-100%) red, pink granulation within the wound bed. There is no necrotic tissue within the wound bed. Wound #8 status is Open. Original cause of wound was Blister. The date acquired was: 04/18/2021. The wound is located on the Right,Lateral Foot. The wound measures 0.7cm length x 1cm width x 0.2cm depth; 0.55cm^2 area and 0.11cm^3 volume. There is Fat Layer (Subcutaneous Tissue) exposed. There is no tunneling noted, however, there is undermining starting at 12:00 and ending at 12:00 with a maximum distance of 5cm. There is a medium amount of purulent drainage noted. The wound margin is distinct with the outline attached to the wound base. There is large (67-100%) pink granulation within the wound bed. There is no necrotic tissue within the wound bed. 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 [name]: [name] Other Clinician: Referring Provider: Treating Provider/Extender: [name] in Treatment: 77 T Contact Cast Applied for Wound Assessment: otal Wound #3 Left,Lateral Foot Performed By: Physician [name], MD Post Procedure Diagnosis Same as Pre-procedure Electronic Signature(s) Signed: 04/21/2021 3:43:46 PM By: [name] MD Signed: 04/21/2021 4:57:53 PM By: Rhae Hammock RN Entered By: Rhae Hammock on 04/21/2021 10:05:07 -------------------------------------------------------------------------------- SuperBill Details Patient Name: Date of Service: [name], [name] 04/21/2021 Medical Record Number: [id number] Patient Account Number: [id number] Date of Birth/Sex: Treating RN: 1985-07-06 (36 y.o. 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 sever Modifier: RT ity Quantity: 1 CPT4 Code: 30160109 Description: 32355 - APPLY TOTAL CONTACT LEG CAST ICD-10 Diagnosis Description L97.528 Non-pressure chronic ulcer of other part of left foot with other specified severi Modifier: LT ty Quantity: 1 Physician Procedures : CPT4 Code Description Modifier [id number] 42706 - WC PHYS SUBQ TISS 20 SQ CM RT ICD-10 Diagnosis Description L97.518 Non-pressure  chronic ulcer of other part of right foot with other specified severity Quantity: 1 : [id number] 90502 - WC PHYS APPLY TOTAL CONTACT CAST LT ICD-10 Diagnosis Description L97.528 Non-pressure chronic ulcer of other part of left foot with other specified severity Quantity: 1 Electronic Signature(s) Signed: 04/23/2021 7:49:28 AM By: Donavan Burnet CHT EMT BS , , Signed: 04/23/2021 8:00:04 AM By: [name] MD Previous Signature: 04/21/2021 3:43:46 PM Version By: [name] MD Previous Signature: 04/21/2021 4:57:53 PM Version By: Rhae Hammock RN Entered By: Donavan Burnet on 04/23/2021 07:49:26

## 2021-04-22 NOTE — Progress Notes (Signed)
[name], [name] ([id number]) Visit Report for 03/23/2021 Arrival Information Details Patient Name: Date of Service: [name] 03/23/2021 12:30 PM Medical Record Number: [id number] Patient Account Number: [id number] Date of Birth/Sex: Treating RN: May 22, 1985 (36 y.o. [name] Primary Care Lil Lepage: [name] Other Clinician: Referring Satin Boal: Treating Mishal Probert/Extender: [name] in Treatment: 64 Visit Information History Since Last Visit Added or deleted any medications: No Patient Arrived: Ambulatory Any new allergies or adverse reactions: No Arrival Time: 12:51 Had a fall or experienced change in No Accompanied By: alone activities of daily living that may affect Transfer Assistance: None risk of falls: Patient Identification Verified: Yes Signs or symptoms of abuse/neglect since last visito No Secondary Verification Process Completed: Yes Hospitalized since last visit: No Patient Requires Transmission-Based Precautions: No Implantable device outside of the clinic excluding No Patient Has Alerts: No cellular tissue based products placed in the center since last visit: Has Dressing in Place as Prescribed: Yes Has Footwear/Offloading in Place as Prescribed: Yes Left: Other:Soft cast Pain Present Now: No Electronic Signature(s) Signed: 03/23/2021 5:35:33 PM By: [name] RN, BSN Entered By: [name] on 03/23/2021 12:56:31 -------------------------------------------------------------------------------- Encounter Discharge Information Details Patient Name: Date of Service: [name], [name] 03/23/2021 12:30 PM Medical Record Number: [id number] Patient Account Number: [id number] Date of Birth/Sex: Treating RN: [birth year] (36 y.o. [name] Primary Care Medrith Veillon: [name] Other Clinician: Referring Raul Torrance: Treating Logun Colavito/Extender: [name] in Treatment: 78 Encounter  Discharge Information Items Discharge Condition: Stable Ambulatory Status: Ambulatory Discharge Destination: Home Transportation: Private Auto Accompanied By: alone Schedule Follow-up Appointment: Yes Clinical Summary of Care: Patient Declined Electronic Signature(s) Signed: 03/23/2021 5:35:33 PM By: [name] RN, BSN Entered By: [name] on 03/23/2021 17:04:18 -------------------------------------------------------------------------------- Lower Extremity Assessment Details Patient Name: Date of Service: [name], [name] 03/23/2021 12:30 PM Medical Record Number: [id number] Patient Account Number: [id number] Date of Birth/Sex: Treating RN: 19-Mar-1986 (36 y.o. [name] Primary Care Guhan Bruington: [name] Other Clinician: Referring Bentley Fissel: Treating Hymen Arnett/Extender: [name] in Treatment: 73 Edema Assessment Assessed: [name]: Yes] [name]: No] Edema: [Left: Ye] [Right: s] Calf Left: Right: Point of Measurement: 48 cm From Medial Instep 48 cm Ankle Left: Right: Point of Measurement: 11 cm From Medial Instep 30 cm Vascular Assessment Pulses: Dorsalis Pedis Palpable: [Left:Yes] Posterior Tibial Palpable: [Left:Yes] Electronic Signature(s) Signed: 03/23/2021 5:35:33 PM By: [name] RN, BSN Entered By: [name] on 03/23/2021 12:51:33 -------------------------------------------------------------------------------- Multi Wound Chart Details Patient Name: Date of Service: [name], [name] 03/23/2021 12:30 PM Medical Record Number: [id number] Patient Account Number: [id number] Date of Birth/Sex: Treating RN: [birth year] (36 y.o. [name] Primary Care Arisa Congleton: [name] Other Clinician: Referring Yadier Bramhall: Treating Makenzey Nanni/Extender: [name] in Treatment: 73 Vital Signs Height(in): 77 Capillary Blood Glucose(mg/dl): 200 Weight(lbs): 280 Pulse(bpm): 32 Body  Mass Index(BMI): 33 Blood Pressure(mmHg): 150/87 Temperature(F): 98.3 Respiratory Rate(breaths/min): 17 Photos: [3:Left, Lateral Foot] [N/A:N/A N/A] Wound Location: [3:Trauma] [N/A:N/A] Wounding Event: [3:Diabetic Wound/Ulcer of the Lower] [N/A:N/A] Primary Etiology: [3:Extremity Type II Diabetes] [N/A:N/A] Comorbid History: [3:10/02/2019] [N/A:N/A] Date Acquired: [3:73] [N/A:N/A] Weeks of Treatment: [3:Open] [N/A:N/A] Wound Status: [3:1.6x1.4x0.3] [N/A:N/A] Measurements L x W x [name] [3:1.759] [N/A:N/A] A (cm) : rea [3:0.528] [N/A:N/A] Volume (cm) : [3:-6.70%] [N/A:N/A] % Reduction in A rea: [3:-220.00%] [N/A:N/A] % Reduction in Volume: [3:Grade 2] [N/A:N/A] Classification: [3:Medium] [N/A:N/A] Exudate A mount: [3:Serosanguineous] [N/A:N/A] Exudate Type: [3:red, brown] [N/A:N/A] Exudate Color: [3:Distinct,  outline attached] [N/A:N/A] Wound Margin: [3:Large (67-100%)] [N/A:N/A] Granulation A mount: [3:Red, Pink] [N/A:N/A] Granulation Quality: [3:Small (1-33%)] [N/A:N/A] Necrotic A mount: [3:Fat Layer (Subcutaneous Tissue): Yes N/A] Exposed Structures: [3:Fascia: No Tendon: No Muscle: No Joint: No Bone: No None] [N/A:N/A] Treatment Notes Electronic Signature(s) Signed: 03/23/2021 4:29:35 PM By: [name] MD Signed: 03/23/2021 5:35:33 PM By: [name] RN, BSN Entered By: [name] on 03/23/2021 13:03:06 -------------------------------------------------------------------------------- Multi-Disciplinary Care Plan Details Patient Name: Date of Service: [name], [name] 03/23/2021 12:30 PM Medical Record Number: [id number] Patient Account Number: [id number] Date of Birth/Sex: Treating RN: [birth year] (36 y.o. [name] Primary Care Deago Burruss: [name] Other Clinician: Referring Meka Lewan: Treating Seon Gaertner/Extender: [name] in Treatment: 41 Multidisciplinary Care Plan reviewed with physician Active  Inactive Nutrition Nursing Diagnoses: Imbalanced nutrition Potential for alteratiion in Nutrition/Potential for imbalanced nutrition Goals: Patient/caregiver agrees to and verbalizes understanding of need to use nutritional supplements and/or vitamins as prescribed Date Initiated: 10/24/2019 Date Inactivated: 04/06/2020 Target Resolution Date: 04/03/2020 Goal Status: Met Patient/caregiver will maintain therapeutic glucose control Date Initiated: 10/24/2019 Target Resolution Date: 04/02/2021 Goal Status: Active Interventions: Assess HgA1c results as ordered upon admission and as needed Assess patient nutrition upon admission and as needed per policy Provide education on elevated blood sugars and impact on wound healing Provide education on nutrition Treatment Activities: Education provided on Nutrition : 11/24/2020 Notes: 11/17/20: Glucose control ongoing issue, target date extended. 01/26/21: Glucose management continues. Wound/Skin Impairment Nursing Diagnoses: Impaired tissue integrity Knowledge deficit related to ulceration/compromised skin integrity Goals: Patient/caregiver will verbalize understanding of skin care regimen Date Initiated: 10/24/2019 Target Resolution Date: 04/02/2021 Goal Status: Active Ulcer/skin breakdown will have a volume reduction of 30% by week 4 Date Initiated: 10/24/2019 Date Inactivated: 01/16/2020 Target Resolution Date: 01/10/2020 Unmet Reason: no change in Goal Status: Unmet measurements. Interventions: Assess patient/caregiver ability to obtain necessary supplies Assess patient/caregiver ability to perform ulcer/skin care regimen upon admission and as needed Assess ulceration(s) every visit Provide education on ulcer and skin care Notes: 11/17/20: Wound care regimen continues Electronic Signature(s) Signed: 03/23/2021 5:35:33 PM By: [name] RN, BSN Entered By: [name] on 03/23/2021  16:59:57 -------------------------------------------------------------------------------- Pain Assessment Details Patient Name: Date of Service: [name], [name] 03/23/2021 12:30 PM Medical Record Number: [id number] Patient Account Number: [id number] Date of Birth/Sex: Treating RN: July 26, 1985 (36 y.o. [name] Primary Care Everlyn Farabaugh: [name] Other Clinician: Referring Shamonique Battiste: Treating Trenten Watchman/Extender: [name] in Treatment: 46 Active Problems Location of Pain Severity and Description of Pain Patient Has Paino No Site Locations Pain Management and Medication Current Pain Management: Electronic Signature(s) Signed: 03/23/2021 5:35:33 PM By: [name] RN, BSN Entered By: [name] on 03/23/2021 12:51:23 -------------------------------------------------------------------------------- Patient/Caregiver Education Details Patient Name: Date of Service: [name] 12/20/2022andnbsp12:30 PM Medical Record Number: [id number] Patient Account Number: [id number] Date of Birth/Gender: Treating RN: [birth year] (36 y.o. [name] Primary Care Physician: [name] Other Clinician: Referring Physician: Treating Physician/Extender: [name] in Treatment: 56 Education Assessment Education Provided To: Patient Education Topics Provided Wound/Skin Impairment: Methods: Explain/Verbal Responses: State content correctly [employer]) Signed: 03/23/2021 5:35:33 PM By: [name] RN, BSN Entered By: [name] on 03/23/2021 17:00:15 -------------------------------------------------------------------------------- Wound Assessment Details Patient Name: Date of Service: [name], [name] 03/23/2021 12:30 PM Medical Record Number: [id number] Patient Account Number: [id number] Date of Birth/Sex: Treating RN: Dec 30, 1985 (36 y.o. [name] Primary Care Kenora Spayd:  [name] Other  Clinician: Referring Dee Paden: Treating Vasco Chong/Extender: [name] in Treatment: 73 Wound Status Wound Number: 3 Primary Etiology: Diabetic Wound/Ulcer of the Lower Extremity Wound Location: Left, Lateral Foot Wound Status: Open Wounding Event: Trauma Comorbid History: Type II Diabetes Date Acquired: 10/02/2019 Weeks Of Treatment: 73 Clustered Wound: No Photos Wound Measurements Length: (cm) 1.6 Width: (cm) 1.4 Depth: (cm) 0.3 Area: (cm) 1.759 Volume: (cm) 0.528 % Reduction in Area: -6.7% % Reduction in Volume: -220% Epithelialization: None Tunneling: No Undermining: No Wound Description Classification: Grade 2 Wound Margin: Distinct, outline attached Exudate Amount: Medium Exudate Type: Serosanguineous Exudate Color: red, brown Foul Odor After Cleansing: No Slough/Fibrino Yes Wound Bed Granulation Amount: Large (67-100%) Exposed Structure Granulation Quality: Red, Pink Fascia Exposed: No Necrotic Amount: Small (1-33%) Fat Layer (Subcutaneous Tissue) Exposed: Yes Necrotic Quality: Adherent Slough Tendon Exposed: No Muscle Exposed: No Joint Exposed: No Bone Exposed: No Electronic Signature(s) Signed: 03/23/2021 5:35:33 PM By: [name] RN, BSN Signed: 04/22/2021 12:37:40 PM By: Rhae Hammock RN Entered By: Rhae Hammock on 03/23/2021 12:52:35 -------------------------------------------------------------------------------- Vitals Details Patient Name: Date of Service: [name], [name] 03/23/2021 12:30 PM Medical Record Number: [id number] Patient Account Number: [id number] Date of Birth/Sex: Treating RN: 08/18/1985 (35 y.o. [name] Primary Care Myrah Strawderman: [name] Other Clinician: Referring Alinah Sheard: Treating Seniyah Esker/Extender: [name] in Treatment: 73 Vital Signs Time Taken: 12:51 Temperature (F): 98.3 Height (in): 77 Pulse (bpm): 80 Weight  (lbs): 280 Respiratory Rate (breaths/min): 17 Body Mass Index (BMI): 33.2 Blood Pressure (mmHg): 150/87 Capillary Blood Glucose (mg/dl): 200 Reference Range: 80 - 120 mg / dl Electronic Signature(s) Signed: 03/23/2021 5:35:33 PM By: [name] RN, BSN Entered By: [name] on 03/23/2021 12:51:18

## 2021-04-26 LAB — AEROBIC/ANAEROBIC CULTURE W GRAM STAIN (SURGICAL/DEEP WOUND): Gram Stain: NONE SEEN

## 2021-04-27 ENCOUNTER — Encounter (HOSPITAL_BASED_OUTPATIENT_CLINIC_OR_DEPARTMENT_OTHER): Payer: BC Managed Care – PPO | Admitting: Internal Medicine

## 2021-04-27 ENCOUNTER — Other Ambulatory Visit: Payer: Self-pay

## 2021-04-27 DIAGNOSIS — E11621 Type 2 diabetes mellitus with foot ulcer: Secondary | ICD-10-CM | POA: Diagnosis not present

## 2021-04-27 NOTE — Progress Notes (Signed)
Scovill, [name] ([id number]) Visit Report for 04/27/2021 Debridement Details Patient Name: Date of Service: [name] 04/27/2021 7:30 A M Medical Record Number: [id number] Patient Account Number: [id number] Date of Birth/Sex: Treating RN: Jun 16, 1985 (36 y.o. [name] Primary Care Provider: [name] Other Clinician: Referring Provider: Treating Provider/Extender: [name] in Treatment: 78 Debridement Performed for Assessment: Wound #8 Right,Lateral Foot Performed By: Physician [name], MD Debridement Type: Debridement Severity of Tissue Pre Debridement: Fat layer exposed Level of Consciousness (Pre-procedure): Awake and Alert Pre-procedure Verification/Time Out Yes - 08:27 Taken: Start Time: 08:27 T Area Debrided (L x W): otal 2 (cm) x 1.5 (cm) = 3 (cm) Tissue and other material debrided: Non-Viable, Skin: Epidermis Level: Skin/Epidermis Debridement Description: Selective/Open Wound Instrument: Forceps, Scissors Bleeding: Minimum Hemostasis Achieved: Pressure End Time: 08:29 Procedural Pain: 0 Post Procedural Pain: 0 Response to Treatment: Procedure was tolerated well Level of Consciousness (Post- Awake and Alert procedure): Post Debridement Measurements of Total Wound Length: (cm) 2 Width: (cm) 1.5 Depth: (cm) 0.1 Volume: (cm) 0.236 Character of Wound/Ulcer Post Debridement: Stable Severity of Tissue Post Debridement: Fat layer exposed Post Procedure Diagnosis Same as Pre-procedure Electronic Signature(s) Signed: 04/27/2021 4:35:26 PM By: [name] MD Signed: 04/27/2021 5:46:53 PM By: [name] RN, BSN Entered By: [name] on 04/27/2021 09:02:03 -------------------------------------------------------------------------------- HPI Details Patient Name: Date of Service: [name], [name] 04/27/2021 7:30 A M Medical Record Number: [id number] Patient Account Number: [id number] Date of  Birth/Sex: Treating RN: [birth year] (36 y.o. 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 He comes in for review of a wound over the plantar fifth metatarsal head extending into the lateral part of the foot. He was followed for this previously by his podiatrist Dr. Cornelius Moras. As the patient tells his story he went to see podiatry first for a swelling he developed on the lateral part of his fifth metatarsal head in May. He states this was "open" by podiatry and the area closed. He was followed up in June and it was again opened callus removed and it closed promptly. There were plans being made for surgery on the fifth metatarsal head in June however his blood sugar was apparently too high for anesthesia. Apparently the area was debrided and opened again in June and it is never closed since. Looking over the records from podiatry I am really not able to follow this. It was clear when he was first seen it was before 5/14 at that point he already had a wound. By 5/17 the ulcer was resolved. I do not see anything about a procedure. On 5/28 noted to have pre-ulcerative moderate keratosis. X-ray noted 1/5 contracted toe and tailor's bunion and metatarsal deformity. On a visit date on 09/28/2018 the dorsal part of the left foot it healed and resolved. There was concern about swelling in his lower extremity he was sent to the ER.. As far as I can tell he was seen in the ER on 7/12 with an ulcer on his left foot. A DVT rule out of the left leg was negative. I do not think I have complete records from podiatry but I am not able to verify the procedures this patient states he had. He states after the last procedure the wound  has never closed  although I am not able to follow this in the records I have from podiatry. He has not had a recent x-ray The patient has been using Neosporin on the wound. He is wearing a Darco shoe. He is still very active up on his foot working and exercising. Past medical history; type 2 diabetes ketosis-prone, leg swelling with a negative DVT study in July. Non-smoker ABI in our clinic was 0.85 on the left 10/16; substantial wound on the plantar left fifth met head extending laterally almost to the dorsal fifth MTP. We have been using silver alginate we gave him a Darco forefoot off loader. An x-ray did not show evidence of osteomyelitis did note soft tissue emphysema which I think was due to gas tracking through an open wound. There is no doubt in my mind he requires an MRI 10/23; MRI not booked until 3 November at the earliest this is largely due to his glucose sensor in the right arm. We have been using silver alginate. There has been an improvement 10/29; I am still not exactly sure when his MRI is booked for. He says it is the third but it is the 10th in epic. This definitely needs to be done. He is running a low-grade fever today but no other symptoms. No real improvement in the 1 02/26/2019 patient presents today for a follow-up visit here in our clinic he is last been seen in the clinic on October 29. Subsequently we were working on getting MRI to evaluate and see what exactly was going on and where we would need to go from the standpoint of whether or not he had osteomyelitis and again what treatments were going be required. Subsequently the patient ended up being admitted to the hospital on 02/07/2019 and was discharged on 02/14/2019. This is a somewhat interesting admission with a discharge diagnosis of pneumonia due to COVID-19 although he was positive for COVID-19 when tested at the urgent care but negative x2 when he was actually in the hospital. With that being said he did have acute respiratory  failure with hypoxia and it was noted he also have a left foot ulceration with osteomyelitis. With that being said he did require oxygen for his pneumonia and I level 4 L. He was placed on antivirals and steroids for the COVID-19. He was also transferred to the [facility] at one point. Nonetheless he did subsequently discharged home and since being home has done much better in that regard. The CT angiogram did not show any pulmonary embolism. With regard to the osteomyelitis the patient was placed on vancomycin and Zosyn while in the hospital but has been changed to Augmentin at discharge. It was also recommended that he follow- up with wound care and podiatry. Podiatry however wanted him to see [name] according to the patient prior to them doing anything further. His hemoglobin A1c was 9.9 as noted in the hospital. Have an MRI of the left foot performed while in the hospital on 02/04/2019. 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 There is an overlying plantar open wound noted an abscess tracking back along the lateral aspect of the fifth metatarsal shaft. There is otherwise diffuse cellulitis and mild fasciitis without findings of polymyositis. The patient did have recently pneumonia secondary to COVID-19 I looked in the chart through epic and it does appear that the patient may need to have an additional x-ray  just to ensure everything is cleared and that he has no airspace disease prior to putting him into the [name]. 03/05/2019; patient was readmitted to the clinic last week. He was hospitalized twice for a viral upper respiratory tract infection from 11/1 through 11/4 and then 11/5 through 11/12 ultimately this turned out to be Covid pneumonitis. Although he was discharged on oxygen he is not using it. He says he feels fine. He has no exercise limitation no cough no sputum. His O2 sat in our  clinic today was 100% on room air. 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 He received Vanco and Zosyn in the hospital and then was discharged on 2 weeks of Augmentin. I do not see any relevant cultures. He was supposed to follow-up with infectious disease but I do not see that he has an appointment. 12/8; patient saw Dr. Novella Olive of infectious disease last week. He felt that he had had adequate antibiotic therapy. He did not go to follow-up with Dr. Amalia Hailey of podiatry and I have again talked to him about the pros and cons of this. He does not want to consider a ray amputation of this time. He is aware of the risks of recurrence, migration etc. He started HBO today and tolerated this well. He can complete the Augmentin that I gave him last week. I have looked over the lab work that Dr. Chana Bode ordered his C-reactive protein was 3.3 and his sedimentation rate was 17. The C-reactive protein is never really been measurably that high in this patient 12/15; not much change in the wound today however he has undermining along the lateral part of the foot again more extensively than last week. He has some rims of epithelialization. We have been using silver alginate. He is undergoing hyperbarics but did not dive today 12/18; in for his obligatory first total contact cast change. Unfortunately there was pus coming from the undermining area around his fifth metatarsal head. This was cultured but will preclude reapplication of a cast. He is seen in conjunction with HBO 12/24; patient had staph lugdunensis in the wound in the undermining area laterally last time. We put him on doxycycline which should have covered this. The wound looks better today. I am going to give him another week of doxycycline before reattempting the total contact cast 12/31; the patient is completing antibiotics. Hemorrhagic  debris in the distal part of the wound with some undermining distally. He also had hyper granulation. Extensive debridement with a #5 curette. The infected area that was on the lateral part of the fifth met head is closed over. I do not think he needs any more antibiotics. Patient was seen prior to HBO. Preparations for a total contact cast were made in the cast will be placed post hyperbarics 04/11/19; once again the patient arrives today without complaint. He had been in a cast all week noted that he had heavy drainage this week. This resulted in large raised areas of macerated tissue around the wound 1/14; wound bed looks better slightly smaller. Hydrofera Blue has been changing himself. He had a heavy drainage last week which caused a lot of maceration around the wound so I took him out of a total contact cast he says the drainage is actually better this week He is seen today in conjunction with HBO 1/21; returns to clinic. He was up in [place] for a day  or 2 attending a funeral. He comes back in with the wound larger and with a large area of exposed bone. He had osteomyelitis and septic arthritis of the fifth left metatarsal head while he was in hospital. He received IV antibiotics in the hospital for a prolonged period of time then 3 weeks of Augmentin. Subsequently I gave him 2 weeks of doxycycline for more superficial wound infection. When I saw this last week the wound was smaller the surface of the wound looks satisfactory. 1/28; patient missed hyperbarics today. Bone biopsy I did last time showed Enterococcus faecalis and Staphylococcus lugdunensis . He has a wide area of exposed bone. We are going to use silver alginate as of today. I had another ethical discussion with the patient. 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 In this situation I think the likelihood of healing this is low. Therefore I have recommended a ray amputation and with the patient's  agreement I have referred him to Dr. Doran Durand. The other issue is that his compliance with hyperbarics has been minimal because of his work schedule and given his underlying decision I am going to stop this today READMISSION 10/24/2019 MRI 09/29/2019 left foot IMPRESSION: 1. Apparent skin ulceration inferior and lateral to the 5th metatarsal base with underlying heterogeneous T2 signal and enhancement in the subcutaneous fat. Small peripherally enhancing fluid collections along the plantar and lateral aspects of the 5th metatarsal base suspicious for abscesses. 2. Interval amputation through the mid 5th metatarsal with nonspecific low-level marrow edema and enhancement. Given the proximity to the adjacent soft tissue inflammatory changes, osteomyelitis cannot be excluded. 3. The additional bones appear unremarkable. MRI 09/29/2019 right foot IMPRESSION: 1. Soft tissue ulceration lateral to the 5th MTP joint. There is low-level T2 hyperintensity within the 4th and 5th metatarsal heads and adjacent proximal phalanges without abnormal T1 signal or cortical destruction. These findings are nonspecific and could be seen with early marrow edema, hyperemia or early osteomyelitis. No evidence of septic joint. 2. Mild tenosynovitis and synovial enhancement associated with the extensor digitorum tendons at the level of the midfoot. 3. Diffuse low-level muscular T2 hyperintensity and enhancement, most consistent with diabetic myopathy. LEFT FOOT BONE Methicillin resistant staphylococcus aureus Staphylococcus lugdunensis MIC MIC CIPROFLOXACIN >=8 RESISTANT Resistant <=0.5 SENSI... Sensitive CLINDAMYCIN <=0.25 SENS... Sensitive >=8 RESISTANT Resistant ERYTHROMYCIN >=8 RESISTANT Resistant >=8 RESISTANT Resistant GENTAMICIN <=0.5 SENSI... Sensitive <=0.5 SENSI... Sensitive Inducible Clindamycin NEGATIVE Sensitive NEGATIVE Sensitive OXACILLIN >=4 RESISTANT Resistant 2 SENSITIVE Sensitive RIFAMPIN  <=0.5 SENSI... Sensitive <=0.5 SENSI... Sensitive TETRACYCLINE <=1 SENSITIVE Sensitive <=1 SENSITIVE Sensitive TRIMETH/SULFA <=10 SENSIT Sensitive <=10 SENSIT Sensitive ... Marland Kitchen.. VANCOMYCIN 1 SENSITIVE Sensitive <=0.5 SENSI... Sensitive Right foot bone . Component 3 wk ago Specimen Description BONE Special Requests RIGHT 4 METATARSAL SAMPLE B Gram Stain NO WBC SEEN NO ORGANISMS SEEN Culture RARE METHICILLIN RESISTANT STAPHYLOCOCCUS AUREUS NO ANAEROBES ISOLATED Performed at [facility], [place] [address], [place], [place] [address] Report Status 10/08/2019 FINAL Organism ID, Bacteria METHICILLIN RESISTANT STAPHYLOCOCCUS AUREUS Resulting Agency [facility] Susceptibility Methicillin resistant staphylococcus aureus MIC CIPROFLOXACIN >=8 RESISTANT Resistant CLINDAMYCIN <=0.25 SENS... Sensitive ERYTHROMYCIN >=8 RESISTANT Resistant GENTAMICIN <=0.5 SENSI... Sensitive Inducible Clindamycin NEGATIVE Sensitive OXACILLIN >=4 RESISTANT Resistant RIFAMPIN <=0.5 SENSI... Sensitive TETRACYCLINE <=1 SENSITIVE Sensitive TRIMETH/SULFA <=10 SENSIT Sensitive ... VANCOMYCIN 1 SENSITIVE Sensitive This is a patient we had in clinic earlier this year with a wound over his left fifth metatarsal head. 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 Doran Durand on 05/16/2019. He seems to have developed problems with wounds on his bilateral feet in June 2021 although he may have had problems earlier than this. He was in an urgent care with a right foot ulcer on 09/26/2019 and given a course of doxycycline. This was apparently after having trouble getting into see orthopedics. He was seen by podiatry on 09/28/2019 noted to have bilateral  lower extremity ulcers including the left lateral fifth metatarsal base and the right subfifth met head. It was noted that had purulent drainage at that time. He required hospitalization from 6/20 through 7/2. This was because of worsening right foot wounds. He underwent bilateral operative incision and drainage and bone biopsies bilaterally. Culture results are listed above. He has been referred back to clinic by Dr. Jacqualyn Posey of podiatry. He is also followed by Dr. Megan Salon who saw him yesterday. He was discharged from hospital on Zyvox Flagyl and Levaquin and yesterday changed to doxycycline Flagyl and Levaquin. His inflammatory markers on 6/26 showed a sedimentation rate of 129 and a C-reactive protein of 5. This is improved to 14 and 1.3 respectively. This would indicate improvement. ABIs in our clinic today were 1.23 on the right and 1.20 on the left 11/01/2019 on evaluation today patient appears to be doing fairly well in regard to the wounds on his feet at this point. Fortunately there is no signs of active infection at this time. No fevers, chills, nausea, vomiting, or diarrhea. He currently is seeing infectious disease and still under their care at this point. Subsequently he also has both wounds which she has not been using collagen on as he did not receive that in his packaging he did not call us and let us know that. Apparently that just was missed on the order. Nonetheless we will get that straightened out today. 8/9-Patient returns for bilateral foot wounds, using Prisma with hydrogel moistened dressings, and the wounds appear stable. Patient using surgical shoes, avoiding much pressure or weightbearing as much as possible 8/16; patient has bilateral foot wounds. 1 on the right lateral foot proximally the other is on the left mid lateral foot. Both required debridement of callus and thick skin around the wounds. We have been using silver collagen 8/27; patient has bilateral lateral foot  wounds. The area on the left substantially surrounded by callus and dry skin. This was removed from the wound edge. The underlying wound is small. The area on the right measured somewhat smaller today. 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 Unfortunately I did not update his antibiotics during today's visit. 9/10 I reviewed Dr. Hale Bogus last notes he felt he had completed antibiotics his inflammatory markers were reasonably well controlled. He has a small wound on the lateral left foot and a tiny area on the right which is just above closed. He is using Hydrofera Blue with border foam he has bilateral surgical shoes 9/24; 2 week f/u. doing well. right foot is closed. left foot still undermined. 10/14; right foot remains closed at the fifth met head. The area over the base of the left fifth metatarsal has a small open area but considerable undermining towards the plantar foot. Thick callus skin around this suggests an adequate  pressure relief. We have talked about this. He says he is going to go back into his cam boot. I suggested a total contact cast he did not seem enamored with this suggestion 10/26; left foot base of the fifth metatarsal. Same condition as last time. He has skin over the area with an open wound however the skin is not adherent. He went to see Dr. Earleen Newport who did an x-ray and culture of his foot I have not reviewed the x-ray but the patient was not told anything. He is on doxycycline 11/11; since the patient was last here he was in the emergency room on 10/30 he was concerned about swelling in the left foot. They did not do any cultures or x-rays. They changed his antibiotics to cephalexin. Previous culture showed group B strep. The cephalexin is appropriate as doxycycline has less than predictable coverage. Arrives in clinic today with swelling over this area under the wound. He also has a new wound on the right  fifth metatarsal head 11/18; the patient has a difficult wound on the lateral aspect of the left fifth metatarsal head. The wound was almost ballotable last week I opened it slightly expecting to see purulence however there was just bleeding. I cultured this this was negative. X-ray unchanged. We are trying to get an MRI but I am not sure were going to be able to get this through his insurance. He also has an area on the right lateral fifth metatarsal head this looks healthier 12/3; the patient finally got our MRI. Surprisingly this did not show osteomyelitis. I did show the soft tissue ulceration at the lateral plantar aspect of the fifth metatarsal base with a tiny residual 6 mm abscess overlying the superficial fascia I have tried to culture this area I have not been able to get this to grow anything. Nevertheless the protruding tissue looks aggravated. I suspect we should try to treat the underlying "abscess with broad-spectrum antibiotics. I am going to start him on Levaquin and Flagyl. He has much less edema in his legs and I am going to continue to wrap his legs and see him weekly 12/10. I started Levaquin and Flagyl on him last week. He just picked up the Flagyl apparently there was some delay. The worry is the wound on the left fifth metatarsal base which is substantial and worsening. His foot looks like he inverts at the ankle making this a weightbearing surface. Certainly no improvement in fact I think the measurements of this are somewhat worse. We have been using 12/17; he apparently just got the Levaquin yesterday this is 2 weeks after the fact. He has completed the Flagyl. The area over the left fifth metatarsal base still has protruding granulation tissue although it does not look quite as bad as it did some weeks ago. He has severe bilateral lymphedema although we have not been treating him for wounds on his legs this is definitely going to require compression. There was so much edema  in the left I did not wish to put him in a total contact cast today. I am going to increase his compression from 3-4 layer. The area on the right lateral fifth met head actually look quite good and superficial. 12/23; patient arrived with callus on the right fifth met head and the substantial hyper granulated callused wound on the base of his fifth metatarsal. He says he is completing his Levaquin in 2 days but I do not think that adds up with what I  gave him but I will have to double check this. We are using Hydrofera Blue on both areas. My plan is to put the left leg in a cast the week after New Year's 04/06/2020; patient's wounds about the same. Right lateral fifth metatarsal head and left lateral foot over the base of the fifth metatarsal. There is undermining on the left lateral foot which I removed before application of total contact cast continuing with Hydrofera Blue new. Patient tells me he was seen by endocrinology today lab work was done [Dr. Kerr]. Also wondering whether he was referred to cardiology. I went over some lab work from previously does not have chronic renal failure certainly not nephrotic range proteinuria he does have very poorly controlled diabetes but this is not his most updated lab work. Hemoglobin A1c has been over 11 1/10; the patient had a considerable amount of leakage towards mid part of his left foot with macerated skin however the wound surface looks better the area on the right lateral fifth met head is better as well. I am going to change the dressing on the left foot under the total contact cast to silver alginate, continue with Hydrofera Blue on the right. 1/20; patient was in the total contact cast for 10 days. Considerable amount of drainage although the skin around the wound does not look too bad on the left foot. The area on the right fifth metatarsal head is closed. Our nursing staff reports large amount of drainage out of the left lateral foot wound 1/25;  continues with copious amounts of drainage described by our intake staff. PCR culture I did last week showed E. coli and Enterococcus faecalis and low quantities. Multiple resistance genes documented including extended spectrum beta lactamase, MRSA, MRSE, quinolone, tetracycline. The wound is not quite as good this week as it was 5 days ago but about the same size 2/3; continues with copious amounts of malodorous drainage per our intake nurse. The PCR culture I did 2 weeks ago showed E. coli and low quantities of Enterococcus. There were multiple resistance genes detected. I put Neosporin on him last week although this does not seem to have helped. The wound is slightly deeper today. Offloading continues to be an issue here although with the amount of drainage she has a total contact cast is just not going to work 2/10; moderate amount of drainage. Patient reports he cannot get his stocking on over the dressing. I told him we have to do that the nurse gave him suggestions on how to make this work. The wound is on the bottom and lateral part of his left foot. Is cultured predominantly grew low amounts of Enterococcus, E. coli and anaerobes. There were multiple resistance genes detected including extended spectrum beta lactamase, quinolone, tetracycline. I could not think of an easy oral combination to address this so for now I am going to do topical antibiotics provided by [facility] I think the main agents here are vancomycin and an aminoglycoside. We have to be able to give him access to the wounds to get the topical antibiotic on 2/17; moderate amount of drainage this is unchanged. He has his Keystone topical antibiotic against the deep tissue culture organisms. He has been using this and changing the dressing daily. Silver alginate on the wound surface. 2/24; using Keystone antibiotic with silver alginate on the top. He had too much drainage for a total contact cast at one point although I think that  is improving and I think in the  next week or 2 it might be possible to replace a total contact cast I did not do this today. In general the wound surface looks healthy however he continues to have thick rims of skin and subcutaneous tissue around the wide area of the circumference which I debrided 06/04/2020 upon evaluation today patient appears to be doing well in regard to his wound. I do feel like he is showing signs of improvement. There is little bit of callus and dead tissue around the edges of the wound as well as what appears to be a little bit of a sinus tract that is off to the side laterally I would perform debridement to clear that away today. 3/17; left lateral foot. The wound looks about the same as I remember. Not much depth surface looks healthy. No evidence of infection 3/25; left lateral foot. Wound surface looks about the same. Separating epithelium from the circumference. There really is no evidence of infection here however not making progress by my view 3/29; left lateral foot. Surface of the wound again looks reasonably healthy still thick skin and subcutaneous tissue around the wound margins. There is no evidence of infection. One of the concerns being brought up by the nurses has again the amount of drainage vis--vis continued use of a total contact cast 4/5; left lateral foot at roughly the base of the fifth metatarsal. Nice healthy looking granulated tissue with rims of epithelialization. The overall wound measurements are not any better but the tissue looks healthy. The only concern is the amount of drainage although he has no surrounding maceration with what we have been doing recently to absorb fluid and protect his skin. He also has lymphedema. 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 He states that the kerlix/coban under the TCC rolled down and could not get it back up. He has been using an offloading scooter and has somehow hurt his right foot using this device. This happened last week. He states that the side of his right foot developed a blister and opened. The top of his foot also has a few small open wounds he thinks is due to his socks rubbing in his shoes. He has not been using any dressings to the wound. He denies purulent drainage, fever/chills or erythema to the wounds. 4/22; patient presents for 1 week follow-up. He developed new wounds to the right foot that were evaluated at last clinic visit. He continues to have a total contact cast to the left leg and he reports no issues. He has been using silver collagen to the right foot wounds with no issues. He denies purulent drainage, fever/chills or erythema to the right foot wounds. He has no complaints today 4/25; patient presents for 1 week follow-up. He has a total contact cast of the left leg and reports no issues. He has been using silver alginate to the right foot wound. He denies purulent drainage, fever/chills or erythema to the right foot wounds. 5/2 patient presents for 1 week follow-up. T contact cast on the left. The wound which is on the base of the plantar foot at the base of the fifth metatarsal otal actually looks quite good and dimensions continue to gradually contract. HOWEVER the area on the  right lateral fifth metatarsal head is much larger than what I remember from 2 weeks ago. Once more is he has significant levels of hypergranulation. Noteworthy that he had this same hyper granulated response on his wound on the left foot at one point in time. So much so that he I thought there was an underlying fluid collection. Based on this I think this just needs debridement. 5/9; the wound on the left actually continues to be gradually smaller  with a healthy surface. Slight amount of drainage and maceration of the skin around but not too bad. However he has a large wound over the right fifth metatarsal head very much in the same configuration as his left foot wound was initially. I used silver nitrate to address the hyper granulated tissue no mechanical debridement 5/16; area on the left foot did not look as healthy this week deeper thick surrounding macerated skin and subcutaneous tissue. The area on the right foot fifth met head was about the same The area on the right ankle that we identified last week is completely broken down into an open wound presumably a stocking rubbing issue 5/23; patient has been using a total contact cast to the left side. He has been using silver alginate underneath. He has also been using silver alginate to the right foot wounds. He has no complaints today. He denies any signs of infection. 5/31; the left-sided wound looks some better measure smaller surface granulation looks better. We have been using silver alginate under the total contact cast The large area on his right fifth met head and right dorsal foot look about the same still using silver alginate 6/6; neither side is good as I was hoping although the surface area dimensions are better. A lot of maceration on his left and right foot around the wound edge. Area on the dorsal right foot looks better. He says he was traveling. I am not sure what does the amount of maceration around the plantar wounds may be drainage issues 6/13; in general the wound surfaces look quite good on both sides. Macerated skin and raised edges around the wound required debridement although in general especially on the left the surface area seems improved. The area on the right dorsal ankle is about the same I thought this would not be such a problem to close 6/20; not much change in either wound although the one on the right looks a little better. Both wounds have thick  macerated edges to the skin requiring debridements. We have been using silver alginate. The area on the dorsal right ankle is still open I thought this would be closed. 6/28; patient comes in today with a marked deterioration in the right foot wound fifth met head. Wide area of exposed bone this is a drastic change from last time. The area on the left there we have been casting is stagnant. We have been using silver alginate in both wound areas. 7/5; bone culture I did for PCR last time was positive for Pseudomonas, group B strep, Enterococcus and Staph aureus. There was no suggestion of methicillin resistance or ampicillin resistant genes. This was resistant to tetracycline however He comes into the clinic today with the area over his right plantar fifth metatarsal head which had been doing so well 2 weeks ago completely necrotic feeling bone. I do not know that this is going to be salvageable. The left foot wound is certainly no smaller but it has a better surface and is superficial. 7/8; patient called  in this morning to say that his total contact cast was rubbing against his foot. He states he is doing fine overall. He denies signs of infection. 7/12; continued deterioration in the wound over the right fifth metatarsal head crumbling bone. This is not going to be salvageable. The patient agrees and wants to be referred to Dr. Doran Durand which we will attempt to arrange as soon as possible. I am going to continue him on antibiotics as long as that takes so I will renew those today. The area on the left foot which is the base of the fifth metatarsal continues to look somewhat better. Healthy looking tissue no depth no debridement is necessary here. 7/20; the patient was kindly seen by Dr. Doran Durand of orthopedics on 10/19/2020. 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 Towards this end we have taken him out of  the total contact cast on the left we will put him in a wrap with Hydrofera Blue. As I understand things surgery is planned for 7/21 7/27; patient had his surgery last Thursday. 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 Apparently everything went well we did not still disturb that today The area on the left foot actually looks quite good. He has been much less mobile which probably explains this he did not seem to do well in the total contact cast secondary to drainage and maceration I think. 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 Fortunately there is no evidence of active infection at this time. No fevers, chills, nausea, vomiting, or diarrhea. Overall I think that he is actually doing extremely well. Nonetheless I do believe that he is staying off of this more following the surgery in his right foot that is the reason the left is doing so great. 8/16; left plantar foot wound. This looks smaller than the last time I saw this he is using Hydrofera Blue. The surgical wound on the right foot is being followed by Dr. Doran Durand we did not look at this today. He has surgical shoes on both feet 8/23; left plantar foot wound not as good this week. Surrounding macerated skin and subcutaneous tissue everything looks moist and wet. I do not think he is offloading this adequately. He is using a surgical shoe Apparently the right foot surgical wound is not open although I did not check his foot 8/31; left plantar foot lateral aspect. Much improved this week. He has no maceration. Some improvement in the surface area of the wound but most impressively the depth is come in we are using silver alginate. The patient is a Product/process development scientist. He is asked that we write him a letter so he can go back to work. I have also tried to see if we can write something that will allow him to limit the amount of time  that he is on his foot at work. 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 Hopefully the latter can be avoided 9/6; I believe the patient missed an appointment last week. He arrives in today with a wound looking roughly the same certainly no better. Undermining laterally and also inferiorly. We used molecuLight today in training with the patient's permission.. We are  using silver alginate 9/21 wound is measuring bigger this week although this may have to do with the aggressive circumferential debridement last week in response to the blush fluorescence on the MolecuLight. Culture I did last week showed significant MSSA and E. coli. I put him on Augmentin but he has not started it yet. We are also going to send this for compounded antibiotics at [facility]. There is no evidence of systemic infection 9/29; silver alginate. His Keystone arrived. He is completing Augmentin in 2 days. Offloading in a cam boot. Moderate drainage per our intake staff 10/5; using silver alginate. He has been using his [place]. He has completed his Augmentin. Per our intake nurse still a lot of drainage, far too much to consider a total contact cast. Wound measures about the same. He had the same undermining area that I defined last week from a roughly 11-3. I remove this today 10/12; using silver alginate he is using the [place]. He comes in for a nurse visit hence we are applying [employer] twice a week. Measuring slightly better today and less notable drainage. Extensive debridement of the wound edge last time 10/18; using topical Keystone and silver alginate and a soft cast. Wound measurements about the same. Drainage was through his soft cast. We are changing this twice a week Tuesdays and Friday 10/25; comes in with moderate drainage. Still using Keystone silver alginate and a soft cast. Wound dimensions completely the same.He has a lot of edema  in the left leg he has lymphedema. Asking for [name] to consider wrapping him as he cannot get his stocking on over the soft cast 11/2; comes in with moderate to large drainage slightly smaller in terms of width we have been using [place]. His wound looks satisfactory but not much improvement 11/4; patient presents today for obligatory cast change. Has no issues or complaints today. He denies signs of infection. 11/9; patient traveled this weekend to [place], was on the cast quite a bit. Staining of the cast with black material from his walking boot. Drainage was not quite as bad as we feared. Using silver alginate and Keystone 11/16; we do not have size for cast therefore we have been putting a soft cast on him since the change on Friday. Still a significant amount of drainage necessitating changing twice a week. We have been using the Keystone at cast changes either hard or soft as well as silver alginate Comes in the clinic with things actually looking fairly good improvement in width. He says his offloading is about the same 02/24/2021 upon evaluation today patient actually comes back in and is doing excellent in regard to his foot ulcer this is significantly smaller even compared to the last visit. The soft cast seems to have done extremely well for him which is great news. I do not see any signs of infection minimal debridement will be needed today. 11/30; left lateral foot much improved half a centimeter improvement in surface area. No evidence of infection. He seems to be doing better with the soft cast in the TCC therefore we will continue with this. He comes back in later in the week for a change with the nurses. This is due to drainage 12/6; no improvement in dimensions. Under illumination some debris on the surface we have been using silver alginate, soft cast. If there is anything optimistic here he seems to have have less drainage 12/13. Dimensions are improved both length and width and  slightly in depth. Appears to be quite healthy  today. Raised edges of this thick skin and callus around the edges however. He is in a soft cast were bringing him back once for a change on Friday. Drainage is better 12/20. Dimensions are improved. He still has raised edges of thick skin and callus around the edges. We are using a soft cast 12/28; comes in today with thick callus around the wound. Using silver under alginate under a soft cast. I do not think there is much improvement in any measurement 2023 04/06/2021; patient was put in a total contact cast. Unfortunately not much change in surface area 1/10; not much different still thick callus and skin around the edge in spite of the total contact cast. This was just debrided last week we have been using the [facility] compounded antibiotic and silver alginate under a total contact cast 1/18 the patient's wound on the left side is doing nicely. smaller HOWEVER he comes in today with a wound on the right foot laterally. blister most likely serosangquenous drainage 1/24; the patient continues to do well in terms of the plantar left foot which is continued to contract using silver alginate under the total contact cast HOWEVER the right lateral foot is bigger with denuded skin around the edges. I used pickups and a #15 scalpel to remove this this looks like the remanence of a large blister. Cannot rule out infection. Culture in this area I did last week showed Staphylococcus lugdunensis few colonies. I am going to try to address this with his [employer] antibiotic that is done so well on the left having linezolid and this should cover the staph Electronic Signature(s) Signed: 04/27/2021 4:35:26 PM By: [name] MD Entered By: [name] on 04/27/2021 09:04:04 -------------------------------------------------------------------------------- Physical Exam Details Patient Name: Date of Service: [name], [name] 04/27/2021 7:30 A M Medical Record  Number: [id number] Patient Account Number: [id number] Date of Birth/Sex: Treating RN: [birth year] (36 y.o. [name] Primary Care Provider: [name] Other Clinician: Referring Provider: Treating Provider/Extender: [name] in Treatment: 70 Constitutional Patient is hypertensive.. Pulse regular and within target range for patient.Marland Kitchen Respirations regular, non-labored and within target range.. Temperature is normal and within the target range for the patient.Marland Kitchen Appears in no distress. Notes Wound exam; his left foot continues to improve the wound is smaller. No debridement is necessary and no change in treatment. We replaced the total contact cast in standard fashion The major problem here is now the right lateral foot large area centered around the base of the fifth metatarsal. I used pickups and scissors to remove a large area of skin. There is swelling around the area. Not convinced there is active infection in the soft tissues Electronic Signature(s) Signed: 04/27/2021 4:35:26 PM By: [name] MD Entered By: [name] on 04/27/2021 09:05:35 -------------------------------------------------------------------------------- Physician Orders Details Patient Name: Date of Service: [name], [name] 04/27/2021 7:30 A M Medical Record Number: [id number] Patient Account Number: [id number] Date of Birth/Sex: Treating RN: [birth year] (36 y.o. 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 - Dr. Dellia Nims Return A Bathing/ Shower/ Hygiene May shower  with protection but do not get wound dressing(s) wet. - Use Cast Protector Bag Edema Control - Lymphedema / SCD / Other Bilateral Lower Extremities Elevate legs to the level of the heart or above for 30 minutes daily and/or when sitting, a frequency of: - throughout the day Avoid standing for long periods of time. Exercise regularly Moisturize legs daily. - right leg every night before bed. Compression stocking or Garment 20-30 mm/Hg pressure to: - Apply to right leg in the morning and remove at night. Off-Loading Total Contact Cast to Left Lower Extremity Open toe surgical shoe to: - right foot Other: - minimal weight bearing left foot Additional Orders / Instructions Follow Nutritious Diet Wound Treatment Wound #3 - Foot Wound Laterality: Left, Lateral Cleanser: Soap and Water 1 x Per Week/7 Days Discharge Instructions: May shower and wash wound with dial antibacterial soap and water prior to dressing change. Topical: keystone 1 x Per Week/7 Days Prim Dressing: KerraCel Ag Gelling Fiber Dressing, 4x5 in (silver alginate) 1 x Per Week/7 Days ary Discharge Instructions: Apply silver alginate to wound bed as instructed Secondary Dressing: Woven Gauze Sponge, Non-Sterile 4x4 in 1 x Per Week/7 Days Discharge Instructions: Apply over primary dressing as directed. Secondary Dressing: Optifoam Non-Adhesive Dressing, 4x4 in 1 x Per Week/7 Days Discharge Instructions: Apply over primary dressing as directed. Compression Wrap: Kerlix Roll 4.5x3.1 (in/yd) 1 x Per Week/7 Days Discharge Instructions: Apply Kerlix and Coban compression as directed. Compression Wrap: Coban Self-Adherent Wrap 4x5 (in/yd) 1 x Per Week/7 Days Discharge Instructions: Apply over Kerlix as directed. Wound #8 - Foot Wound Laterality: Right, Lateral Cleanser: Soap and Water 1 x Per Week/7 Days Discharge Instructions: May shower  and wash wound with dial antibacterial soap and water prior to dressing change. Topical: keystone 1 x Per Week/7 Days Prim Dressing: KerraCel Ag Gelling Fiber Dressing, 4x5 in (silver alginate) 1 x Per Week/7 Days ary Discharge Instructions: Apply silver alginate to wound bed as instructed Secondary Dressing: Woven Gauze Sponge, Non-Sterile 4x4 in 1 x Per Week/7 Days Discharge Instructions: Apply over primary dressing as directed. Secondary Dressing: Optifoam Non-Adhesive Dressing, 4x4 in 1 x Per Week/7 Days Discharge Instructions: Apply over primary dressing as directed. Secured With: [employer], 4.5x3.1 (in/yd) 1 x Per Week/7 Days Discharge Instructions: Secure with Kerlix as directed. Secured With: 23M Medipore H Soft Cloth Surgical T ape, 4 x 10 (in/yd) 1 x Per Week/7 Days Discharge Instructions: Secure with tape as directed. Radiology X-ray, right foot, complete view - Non healing wound on right lateral foot - (ICD10 E11.621 - Type 2 diabetes mellitus with foot ulcer) Electronic Signature(s) Signed: 04/27/2021 4:35:26 PM By: [name] MD Signed: 04/27/2021 5:46:53 PM By: [name] RN, BSN Entered By: [name] on 04/27/2021 08:58:35 Prescription 04/27/2021 -------------------------------------------------------------------------------- Wisor, [name] Shell Yandow MD Patient Name: Provider: 01-31-1986 [id number] Date of Birth: NPI#: Jerilynn Mages [id number] Sex: DEA #: [phone] [id number] Phone #: License #: [place] Patient Address: [address] [place], [place] [address] [place], [place] [address] [phone] Allergies metformin Provider's Orders X-ray, right foot, complete view - ICD10: E11.621 - Non healing wound on right lateral foot Hand Signature: Date(s): Electronic Signature(s) Signed: 04/27/2021 4:35:26 PM By: [name] MD Signed: 04/27/2021 5:46:53 PM By: [name]  RN, BSN Entered By: [name] on 04/27/2021 08:58:36 -------------------------------------------------------------------------------- Problem List Details Patient Name: Date of Service: [name], [name] 04/27/2021 7:30 A M Medical Record Number:  [id number] Patient Account Number: [id number] Date of Birth/Sex: Treating RN: [birth year] (36 y.o. [name] Primary Care Provider: [name] Other Clinician: Referring Provider: Treating Provider/Extender: [name] in Treatment: 78 Active Problems ICD-10 Encounter Code Description Active Date MDM Diagnosis E11.621 Type 2 diabetes mellitus with foot ulcer 10/24/2019 No Yes L97.528 Non-pressure chronic ulcer of other part of left foot with other specified 10/24/2019 No Yes severity L97.518 Non-pressure chronic ulcer of other part of right foot with other specified 10/24/2019 No Yes severity Inactive Problems ICD-10 Code Description Active Date Inactive Date L97.518 Non-pressure chronic ulcer of other part of right foot with other specified severity 07/14/2020 07/14/2020 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, right ankle and foot 10/24/2019 10/24/2019 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 08/10/2020 08/10/2020 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, left ankle and foot 10/24/2019 10/24/2019 B95.62 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 10/24/2019 10/24/2019 classified elsewhere Resolved Problems Electronic Signature(s) Signed: 04/27/2021 4:35:26 PM By: [name] MD Entered By: [name] on 04/27/2021 09:00:00 -------------------------------------------------------------------------------- Progress Note Details Patient Name: Date of Service: [name], [name] 04/27/2021 7:30 A M Medical Record Number: [id number] Patient Account Number: [id number] Date of Birth/Sex: Treating RN: 08/27/1985 (36 y.o. 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 He comes in for review of a wound over the plantar fifth metatarsal head extending into the lateral part of the foot. He was followed for this previously by his podiatrist Dr. Cornelius Moras. As the patient tells his story he went to see podiatry first for a swelling he developed on the lateral part of his fifth metatarsal head in May. He states this was "open" by podiatry and the area closed. He was followed up in June and it was again opened callus removed and it closed promptly. There were plans being made for surgery on the fifth metatarsal head in June however his blood sugar was apparently too high for anesthesia. Apparently the area was debrided and opened again in June and it is never closed since. Looking over the records from podiatry I am really not able to follow this. It was clear when he was first seen it was before 5/14 at that point he already had a wound. By 5/17 the ulcer was resolved. I do not see anything about a procedure. On 5/28 noted to have pre-ulcerative moderate keratosis. X-ray noted 1/5 contracted toe and tailor's bunion and metatarsal deformity. On a visit date on 09/28/2018 the dorsal part of the left foot it healed and resolved. There was concern about swelling in his lower extremity he was sent to the ER.. As far as I can tell he was seen in the ER on 7/12 with an ulcer on his left foot. A DVT rule out of the left leg was negative. I do not think I have complete records from podiatry but I am not able to verify the procedures this patient states he had. He states after the last procedure the wound has never closed although I am not able to follow this in the records  I have from podiatry. He has not had a recent x-ray The patient has been using Neosporin on the wound. He is wearing a Darco shoe. He is still very active  up on his foot working and exercising. Past medical history; type 2 diabetes ketosis-prone, leg swelling with a negative DVT study in July. Non-smoker ABI in our clinic was 0.85 on the left 10/16; substantial wound on the plantar left fifth met head extending laterally almost to the dorsal fifth MTP. We have been using silver alginate we gave him a Darco forefoot off loader. An x-ray did not show evidence of osteomyelitis did note soft tissue emphysema which I think was due to gas tracking through an open wound. There is no doubt in my mind he requires an MRI 10/23; MRI not booked until 3 November at the earliest this is largely due to his glucose sensor in the right arm. We have been using silver alginate. There has been an improvement 10/29; I am still not exactly sure when his MRI is booked for. He says it is the third but it is the 10th in epic. This definitely needs to be done. He is running a low-grade fever today but no other symptoms. No real improvement in the 1 02/26/2019 patient presents today for a follow-up visit here in our clinic he is last been seen in the clinic on October 29. Subsequently we were working on getting MRI to evaluate and see what exactly was going on and where we would need to go from the standpoint of whether or not he had osteomyelitis and again what treatments were going be required. Subsequently the patient ended up being admitted to the hospital on 02/07/2019 and was discharged on 02/14/2019. This is a somewhat interesting admission with a discharge diagnosis of pneumonia due to COVID-19 although he was positive for COVID-19 when tested at the urgent care but negative x2 when he was actually in the hospital. With that being said he did have acute respiratory failure with hypoxia and it was noted he also have a  left foot ulceration with osteomyelitis. With that being said he did require oxygen for his pneumonia and I level 4 L. He was placed on antivirals and steroids for the COVID-19. He was also transferred to the [facility] at one point. Nonetheless he did subsequently discharged home and since being home has done much better in that regard. The CT angiogram did not show any pulmonary embolism. With regard to the osteomyelitis the patient was placed on vancomycin and Zosyn while in the hospital but has been changed to Augmentin at discharge. It was also recommended that he follow- up with wound care and podiatry. Podiatry however wanted him to see [name] according to the patient prior to them doing anything further. His hemoglobin A1c was 9.9 as noted in the hospital. Have an MRI of the left foot performed while in the hospital on 02/04/2019. 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 There is an overlying plantar open wound noted an abscess tracking back along the lateral aspect of the fifth metatarsal shaft. There is otherwise diffuse cellulitis and mild fasciitis without findings of polymyositis. The patient did have recently pneumonia secondary to COVID-19 I looked in the chart through epic and it does appear that the patient may need to have an additional x-ray just to ensure everything is cleared and that he has no airspace disease prior to putting him into the [name]. 03/05/2019; patient was readmitted to the clinic last week. He was hospitalized twice for a viral upper respiratory tract infection from 11/1 through 11/4 and then  11/5 through 11/12 ultimately this turned out to be Covid pneumonitis. Although he was discharged on oxygen he is not using it. He says he feels fine. He has no exercise limitation no cough no sputum. His O2 sat in our clinic today was 100% on room air. He did manage to have  his MRI which showed septic arthritis at the fifth MTP joint and osteomyelitis involving the fifth metatarsal head and proximal phalanx. He received Vanco and Zosyn in the hospital and then was discharged on 2 weeks of Augmentin. I do not see any relevant cultures. He was supposed to follow-up with infectious disease but I do not see that he has an appointment. 12/8; patient saw Dr. Novella Olive of infectious disease last week. He felt that he had had adequate antibiotic therapy. He did not go to follow-up with Dr. Amalia Hailey of podiatry and I have again talked to him about the pros and cons of this. He does not want to consider a ray amputation of this time. He is aware of the risks of recurrence, migration etc. He started HBO today and tolerated this well. He can complete the Augmentin that I gave him last week. I have looked over the lab work that Dr. Chana Bode ordered his C-reactive protein was 3.3 and his sedimentation rate was 17. The C-reactive protein is never really been measurably that high in this patient 12/15; not much change in the wound today however he has undermining along the lateral part of the foot again more extensively than last week. He has some rims of epithelialization. We have been using silver alginate. He is undergoing hyperbarics but did not dive today 12/18; in for his obligatory first total contact cast change. Unfortunately there was pus coming from the undermining area around his fifth metatarsal head. This was cultured but will preclude reapplication of a cast. He is seen in conjunction with HBO 12/24; patient had staph lugdunensis in the wound in the undermining area laterally last time. We put him on doxycycline which should have covered this. The wound looks better today. I am going to give him another week of doxycycline before reattempting the total contact cast 12/31; the patient is completing antibiotics. Hemorrhagic debris in the distal part of the wound with some  undermining distally. He also had hyper granulation. Extensive debridement with a #5 curette. The infected area that was on the lateral part of the fifth met head is closed over. I do not think he needs any more antibiotics. Patient was seen prior to HBO. Preparations for a total contact cast were made in the cast will be placed post hyperbarics 04/11/19; once again the patient arrives today without complaint. He had been in a cast all week noted that he had heavy drainage this week. This resulted in large raised areas of macerated tissue around the wound 1/14; wound bed looks better slightly smaller. Hydrofera Blue has been changing himself. He had a heavy drainage last week which caused a lot of maceration around the wound so I took him out of a total contact cast he says the drainage is actually better this week He is seen today in conjunction with HBO 1/21; returns to clinic. He was up in [place] for a day or 2 attending a funeral. He comes back in with the wound larger and with a large area of exposed bone. He had osteomyelitis and septic arthritis of the fifth left metatarsal head while he was in hospital. He received IV antibiotics in the hospital for  a prolonged period of time then 3 weeks of Augmentin. Subsequently I gave him 2 weeks of doxycycline for more superficial wound infection. When I saw this last week the wound was smaller the surface of the wound looks satisfactory. 1/28; patient missed hyperbarics today. Bone biopsy I did last time showed Enterococcus faecalis and Staphylococcus lugdunensis . He has a wide area of exposed bone. We are going to use silver alginate as of today. I had another ethical discussion with the patient. 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 In this situation I think the likelihood of healing this is low. Therefore I have recommended a ray amputation and with the patient's agreement I have referred him to Dr. Doran Durand. The  other issue is that his compliance with hyperbarics has been minimal because of his work schedule and given his underlying decision I am going to stop this today READMISSION 10/24/2019 MRI 09/29/2019 left foot IMPRESSION: 1. Apparent skin ulceration inferior and lateral to the 5th metatarsal base with underlying heterogeneous T2 signal and enhancement in the subcutaneous fat. Small peripherally enhancing fluid collections along the plantar and lateral aspects of the 5th metatarsal base suspicious for abscesses. 2. Interval amputation through the mid 5th metatarsal with nonspecific low-level marrow edema and enhancement. Given the proximity to the adjacent soft tissue inflammatory changes, osteomyelitis cannot be excluded. 3. The additional bones appear unremarkable. MRI 09/29/2019 right foot IMPRESSION: 1. Soft tissue ulceration lateral to the 5th MTP joint. There is low-level T2 hyperintensity within the 4th and 5th metatarsal heads and adjacent proximal phalanges without abnormal T1 signal or cortical destruction. These findings are nonspecific and could be seen with early marrow edema, hyperemia or early osteomyelitis. No evidence of septic joint. 2. Mild tenosynovitis and synovial enhancement associated with the extensor digitorum tendons at the level of the midfoot. 3. Diffuse low-level muscular T2 hyperintensity and enhancement, most consistent with diabetic myopathy. LEFT FOOT BONE Methicillin resistant staphylococcus aureus Staphylococcus lugdunensis MIC MIC CIPROFLOXACIN >=8 RESISTANT Resistant <=0.5 SENSI... Sensitive CLINDAMYCIN <=0.25 SENS... Sensitive >=8 RESISTANT Resistant ERYTHROMYCIN >=8 RESISTANT Resistant >=8 RESISTANT Resistant GENTAMICIN <=0.5 SENSI... Sensitive <=0.5 SENSI... Sensitive Inducible Clindamycin NEGATIVE Sensitive NEGATIVE Sensitive OXACILLIN >=4 RESISTANT Resistant 2 SENSITIVE Sensitive RIFAMPIN <=0.5 SENSI... Sensitive <=0.5 SENSI...  Sensitive TETRACYCLINE <=1 SENSITIVE Sensitive <=1 SENSITIVE Sensitive TRIMETH/SULFA <=10 SENSIT Sensitive <=10 SENSIT Sensitive ... Marland Kitchen.. VANCOMYCIN 1 SENSITIVE Sensitive <=0.5 SENSI... Sensitive Right foot bone . Component 3 wk ago Specimen Description BONE Special Requests RIGHT 4 METATARSAL SAMPLE B Gram Stain NO WBC SEEN NO ORGANISMS SEEN Culture RARE METHICILLIN RESISTANT STAPHYLOCOCCUS AUREUS NO ANAEROBES ISOLATED Performed at [facility], [place] [address] [place], [place] [address] Report Status 10/08/2019 FINAL Organism ID, Bacteria METHICILLIN RESISTANT STAPHYLOCOCCUS AUREUS Resulting Agency [facility] Susceptibility Methicillin resistant staphylococcus aureus MIC CIPROFLOXACIN >=8 RESISTANT Resistant CLINDAMYCIN <=0.25 SENS... Sensitive ERYTHROMYCIN >=8 RESISTANT Resistant GENTAMICIN <=0.5 SENSI... Sensitive Inducible Clindamycin NEGATIVE Sensitive OXACILLIN >=4 RESISTANT Resistant RIFAMPIN <=0.5 SENSI... Sensitive TETRACYCLINE <=1 SENSITIVE Sensitive TRIMETH/SULFA <=10 SENSIT Sensitive ... VANCOMYCIN 1 SENSITIVE Sensitive This is a patient we had in clinic earlier this year with a wound over his left fifth metatarsal head. 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 Doran Durand on 05/16/2019. He seems  to have developed problems with wounds on his bilateral feet in June 2021 although he may have had problems earlier than this. He was in an urgent care with a right foot ulcer on 09/26/2019 and given a course of doxycycline. This was apparently after having trouble getting into see orthopedics. He was seen by podiatry on 09/28/2019 noted to have bilateral lower extremity ulcers including the left  lateral fifth metatarsal base and the right subfifth met head. It was noted that had purulent drainage at that time. He required hospitalization from 6/20 through 7/2. This was because of worsening right foot wounds. He underwent bilateral operative incision and drainage and bone biopsies bilaterally. Culture results are listed above. He has been referred back to clinic by Dr. Jacqualyn Posey of podiatry. He is also followed by Dr. Megan Salon who saw him yesterday. He was discharged from hospital on Zyvox Flagyl and Levaquin and yesterday changed to doxycycline Flagyl and Levaquin. His inflammatory markers on 6/26 showed a sedimentation rate of 129 and a C-reactive protein of 5. This is improved to 14 and 1.3 respectively. This would indicate improvement. ABIs in our clinic today were 1.23 on the right and 1.20 on the left 11/01/2019 on evaluation today patient appears to be doing fairly well in regard to the wounds on his feet at this point. Fortunately there is no signs of active infection at this time. No fevers, chills, nausea, vomiting, or diarrhea. He currently is seeing infectious disease and still under their care at this point. Subsequently he also has both wounds which she has not been using collagen on as he did not receive that in his packaging he did not call us and let us know that. Apparently that just was missed on the order. Nonetheless we will get that straightened out today. 8/9-Patient returns for bilateral foot wounds, using Prisma with hydrogel moistened dressings, and the wounds appear stable. Patient using surgical shoes, avoiding much pressure or weightbearing as much as possible 8/16; patient has bilateral foot wounds. 1 on the right lateral foot proximally the other is on the left mid lateral foot. Both required debridement of callus and thick skin around the wounds. We have been using silver collagen 8/27; patient has bilateral lateral foot wounds. The area on the left substantially  surrounded by callus and dry skin. This was removed from the wound edge. The underlying wound is small. The area on the right measured somewhat smaller today. 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 Unfortunately I did not update his antibiotics during today's visit. 9/10 I reviewed Dr. Hale Bogus last notes he felt he had completed antibiotics his inflammatory markers were reasonably well controlled. He has a small wound on the lateral left foot and a tiny area on the right which is just above closed. He is using Hydrofera Blue with border foam he has bilateral surgical shoes 9/24; 2 week f/u. doing well. right foot is closed. left foot still undermined. 10/14; right foot remains closed at the fifth met head. The area over the base of the left fifth metatarsal has a small open area but considerable undermining towards the plantar foot. Thick callus skin around this suggests an adequate pressure relief. We have talked about this. He says he is going to go back into his cam boot. I suggested a total contact cast he did not seem enamored with this suggestion 10/26; left foot base of the fifth metatarsal. Same condition as last time.  He has skin over the area with an open wound however the skin is not adherent. He went to see Dr. Earleen Newport who did an x-ray and culture of his foot I have not reviewed the x-ray but the patient was not told anything. He is on doxycycline 11/11; since the patient was last here he was in the emergency room on 10/30 he was concerned about swelling in the left foot. They did not do any cultures or x-rays. They changed his antibiotics to cephalexin. Previous culture showed group B strep. The cephalexin is appropriate as doxycycline has less than predictable coverage. Arrives in clinic today with swelling over this area under the wound. He also has a new wound on the right fifth metatarsal head 11/18; the patient  has a difficult wound on the lateral aspect of the left fifth metatarsal head. The wound was almost ballotable last week I opened it slightly expecting to see purulence however there was just bleeding. I cultured this this was negative. X-ray unchanged. We are trying to get an MRI but I am not sure were going to be able to get this through his insurance. He also has an area on the right lateral fifth metatarsal head this looks healthier 12/3; the patient finally got our MRI. Surprisingly this did not show osteomyelitis. I did show the soft tissue ulceration at the lateral plantar aspect of the fifth metatarsal base with a tiny residual 6 mm abscess overlying the superficial fascia I have tried to culture this area I have not been able to get this to grow anything. Nevertheless the protruding tissue looks aggravated. I suspect we should try to treat the underlying "abscess with broad-spectrum antibiotics. I am going to start him on Levaquin and Flagyl. He has much less edema in his legs and I am going to continue to wrap his legs and see him weekly 12/10. I started Levaquin and Flagyl on him last week. He just picked up the Flagyl apparently there was some delay. The worry is the wound on the left fifth metatarsal base which is substantial and worsening. His foot looks like he inverts at the ankle making this a weightbearing surface. Certainly no improvement in fact I think the measurements of this are somewhat worse. We have been using 12/17; he apparently just got the Levaquin yesterday this is 2 weeks after the fact. He has completed the Flagyl. The area over the left fifth metatarsal base still has protruding granulation tissue although it does not look quite as bad as it did some weeks ago. He has severe bilateral lymphedema although we have not been treating him for wounds on his legs this is definitely going to require compression. There was so much edema in the left I did not wish to put him in  a total contact cast today. I am going to increase his compression from 3-4 layer. The area on the right lateral fifth met head actually look quite good and superficial. 12/23; patient arrived with callus on the right fifth met head and the substantial hyper granulated callused wound on the base of his fifth metatarsal. He says he is completing his Levaquin in 2 days but I do not think that adds up with what I gave him but I will have to double check this. We are using Hydrofera Blue on both areas. My plan is to put the left leg in a cast the week after New Year's 04/06/2020; patient's wounds about the same. Right lateral fifth metatarsal head and  left lateral foot over the base of the fifth metatarsal. There is undermining on the left lateral foot which I removed before application of total contact cast continuing with Hydrofera Blue new. Patient tells me he was seen by endocrinology today lab work was done [Dr. Kerr]. Also wondering whether he was referred to cardiology. I went over some lab work from previously does not have chronic renal failure certainly not nephrotic range proteinuria he does have very poorly controlled diabetes but this is not his most updated lab work. Hemoglobin A1c has been over 11 1/10; the patient had a considerable amount of leakage towards mid part of his left foot with macerated skin however the wound surface looks better the area on the right lateral fifth met head is better as well. I am going to change the dressing on the left foot under the total contact cast to silver alginate, continue with Hydrofera Blue on the right. 1/20; patient was in the total contact cast for 10 days. Considerable amount of drainage although the skin around the wound does not look too bad on the left foot. The area on the right fifth metatarsal head is closed. Our nursing staff reports large amount of drainage out of the left lateral foot wound 1/25; continues with copious amounts of  drainage described by our intake staff. PCR culture I did last week showed E. coli and Enterococcus faecalis and low quantities. Multiple resistance genes documented including extended spectrum beta lactamase, MRSA, MRSE, quinolone, tetracycline. The wound is not quite as good this week as it was 5 days ago but about the same size 2/3; continues with copious amounts of malodorous drainage per our intake nurse. The PCR culture I did 2 weeks ago showed E. coli and low quantities of Enterococcus. There were multiple resistance genes detected. I put Neosporin on him last week although this does not seem to have helped. The wound is slightly deeper today. Offloading continues to be an issue here although with the amount of drainage she has a total contact cast is just not going to work 2/10; moderate amount of drainage. Patient reports he cannot get his stocking on over the dressing. I told him we have to do that the nurse gave him suggestions on how to make this work. The wound is on the bottom and lateral part of his left foot. Is cultured predominantly grew low amounts of Enterococcus, E. coli and anaerobes. There were multiple resistance genes detected including extended spectrum beta lactamase, quinolone, tetracycline. I could not think of an easy oral combination to address this so for now I am going to do topical antibiotics provided by [facility] I think the main agents here are vancomycin and an aminoglycoside. We have to be able to give him access to the wounds to get the topical antibiotic on 2/17; moderate amount of drainage this is unchanged. He has his Keystone topical antibiotic against the deep tissue culture organisms. He has been using this and changing the dressing daily. Silver alginate on the wound surface. 2/24; using Keystone antibiotic with silver alginate on the top. He had too much drainage for a total contact cast at one point although I think that is improving and I think in the  next week or 2 it might be possible to replace a total contact cast I did not do this today. In general the wound surface looks healthy however he continues to have thick rims of skin and subcutaneous tissue around the wide area of the circumference  which I debrided 06/04/2020 upon evaluation today patient appears to be doing well in regard to his wound. I do feel like he is showing signs of improvement. There is little bit of callus and dead tissue around the edges of the wound as well as what appears to be a little bit of a sinus tract that is off to the side laterally I would perform debridement to clear that away today. 3/17; left lateral foot. The wound looks about the same as I remember. Not much depth surface looks healthy. No evidence of infection 3/25; left lateral foot. Wound surface looks about the same. Separating epithelium from the circumference. There really is no evidence of infection here however not making progress by my view 3/29; left lateral foot. Surface of the wound again looks reasonably healthy still thick skin and subcutaneous tissue around the wound margins. There is no evidence of infection. One of the concerns being brought up by the nurses has again the amount of drainage vis--vis continued use of a total contact cast 4/5; left lateral foot at roughly the base of the fifth metatarsal. Nice healthy looking granulated tissue with rims of epithelialization. The overall wound measurements are not any better but the tissue looks healthy. The only concern is the amount of drainage although he has no surrounding maceration with what we have been doing recently to absorb fluid and protect his skin. He also has lymphedema. 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 He states that the kerlix/coban under the TCC rolled down and could not get it back up. He has been using an offloading scooter and has somehow hurt his right foot using this device. This happened last week. He states that the side of his right foot developed a blister and opened. The top of his foot also has a few small open wounds he thinks is due to his socks rubbing in his shoes. He has not been using any dressings to the wound. He denies purulent drainage, fever/chills or erythema to the wounds. 4/22; patient presents for 1 week follow-up. He developed new wounds to the right foot that were evaluated at last clinic visit. He continues to have a total contact cast to the left leg and he reports no issues. He has been using silver collagen to the right foot wounds with no issues. He denies purulent drainage, fever/chills or erythema to the right foot wounds. He has no complaints today 4/25; patient presents for 1 week follow-up. He has a total contact cast of the left leg and reports no issues. He has been using silver alginate to the right foot wound. He denies purulent drainage, fever/chills or erythema to the right foot wounds. 5/2 patient presents for 1 week follow-up. T contact cast on the left. The wound which is on the base of the plantar foot at the base of the fifth metatarsal otal actually looks quite good and dimensions continue to gradually contract. HOWEVER the area on the right lateral fifth metatarsal head is much larger than what I remember from 2 weeks ago. Once more is he has significant levels of hypergranulation. Noteworthy that he had this same hyper granulated response on his wound on the left foot at one point in time.  So much so that he I thought there was an underlying fluid collection. Based on this I think this just needs debridement. 5/9; the wound on the left actually continues to be gradually smaller with a healthy surface. Slight amount  of drainage and maceration of the skin around but not too bad. However he has a large wound over the right fifth metatarsal head very much in the same configuration as his left foot wound was initially. I used silver nitrate to address the hyper granulated tissue no mechanical debridement 5/16; area on the left foot did not look as healthy this week deeper thick surrounding macerated skin and subcutaneous tissue. oo The area on the right foot fifth met head was about the same oo The area on the right ankle that we identified last week is completely broken down into an open wound presumably a stocking rubbing issue 5/23; patient has been using a total contact cast to the left side. He has been using silver alginate underneath. He has also been using silver alginate to the right foot wounds. He has no complaints today. He denies any signs of infection. 5/31; the left-sided wound looks some better measure smaller surface granulation looks better. We have been using silver alginate under the total contact cast oo The large area on his right fifth met head and right dorsal foot look about the same still using silver alginate 6/6; neither side is good as I was hoping although the surface area dimensions are better. A lot of maceration on his left and right foot around the wound edge. Area on the dorsal right foot looks better. He says he was traveling. I am not sure what does the amount of maceration around the plantar wounds may be drainage issues 6/13; in general the wound surfaces look quite good on both sides. Macerated skin and raised edges around the wound required debridement although in general especially on the left the surface area seems improved. oo The area on the right dorsal ankle is about the same I thought this would not be such a problem to close 6/20; not much change in either wound although the one on the right looks a little better. Both wounds have thick macerated edges to the  skin requiring debridements. We have been using silver alginate. The area on the dorsal right ankle is still open I thought this would be closed. 6/28; patient comes in today with a marked deterioration in the right foot wound fifth met head. Wide area of exposed bone this is a drastic change from last time. The area on the left there we have been casting is stagnant. We have been using silver alginate in both wound areas. 7/5; bone culture I did for PCR last time was positive for Pseudomonas, group B strep, Enterococcus and Staph aureus. There was no suggestion of methicillin resistance or ampicillin resistant genes. This was resistant to tetracycline however He comes into the clinic today with the area over his right plantar fifth metatarsal head which had been doing so well 2 weeks ago completely necrotic feeling bone. I do not know that this is going to be salvageable. The left foot wound is certainly no smaller but it has a better surface and is superficial. 7/8; patient called in this morning to say that his total contact cast was rubbing against his foot. He states he is doing fine overall. He denies signs of infection. 7/12; continued deterioration in the wound over the right fifth metatarsal head crumbling bone. This  is not going to be salvageable. The patient agrees and wants to be referred to Dr. Doran Durand which we will attempt to arrange as soon as possible. I am going to continue him on antibiotics as long as that takes so I will renew those today. The area on the left foot which is the base of the fifth metatarsal continues to look somewhat better. Healthy looking tissue no depth no debridement is necessary here. 7/20; the patient was kindly seen by Dr. Doran Durand of orthopedics on 10/19/2020. 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 Towards this end we have taken him out of the total contact cast  on the left we will put him in a wrap with Hydrofera Blue. As I understand things surgery is planned for 7/21 7/27; patient had his surgery last Thursday. 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 Apparently everything went well we did not still disturb that today The area on the left foot actually looks quite good. He has been much less mobile which probably explains this he did not seem to do well in the total contact cast secondary to drainage and maceration I think. 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 Fortunately there is no evidence of active infection at this time. No fevers, chills, nausea, vomiting, or diarrhea. Overall I think that he is actually doing extremely well. Nonetheless I do believe that he is staying off of this more following the surgery in his right foot that is the reason the left is doing so great. 8/16; left plantar foot wound. This looks smaller than the last time I saw this he is using Hydrofera Blue. The surgical wound on the right foot is being followed by Dr. Doran Durand we did not look at this today. He has surgical shoes on both feet 8/23; left plantar foot wound not as good this week. Surrounding macerated skin and subcutaneous tissue everything looks moist and wet. I do not think he is offloading this adequately. He is using a surgical shoe Apparently the right foot surgical wound is not open although I did not check his foot 8/31; left plantar foot lateral aspect. Much improved this week. He has no maceration. Some improvement in the surface area of the wound but most impressively the depth is come in we are using silver alginate. The patient is a Product/process development scientist. He is asked that we write him a letter so he can go back to work. I have also tried to see if we can write something that will allow him to limit the amount of time that he is on his foot  at work. 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 Hopefully the latter can be avoided 9/6; I believe the patient missed an appointment last week. He arrives in today with a wound looking roughly the same certainly no better. Undermining laterally and also inferiorly. We used molecuLight today in training with the patient's permission.. We are using silver alginate 9/21 wound is measuring bigger this week although this may have to do with the aggressive circumferential debridement last week in response to the blush fluorescence on the MolecuLight. Culture I did last week showed significant MSSA and E.  coli. I put him on Augmentin but he has not started it yet. We are also going to send this for compounded antibiotics at [facility]. There is no evidence of systemic infection 9/29; silver alginate. His Keystone arrived. He is completing Augmentin in 2 days. Offloading in a cam boot. Moderate drainage per our intake staff 10/5; using silver alginate. He has been using his [place]. He has completed his Augmentin. Per our intake nurse still a lot of drainage, far too much to consider a total contact cast. Wound measures about the same. He had the same undermining area that I defined last week from a roughly 11-3. I remove this today 10/12; using silver alginate he is using the [place]. He comes in for a nurse visit hence we are applying [employer] twice a week. Measuring slightly better today and less notable drainage. Extensive debridement of the wound edge last time 10/18; using topical Keystone and silver alginate and a soft cast. Wound measurements about the same. Drainage was through his soft cast. We are changing this twice a week Tuesdays and Friday 10/25; comes in with moderate drainage. Still using Keystone silver alginate and a soft cast. Wound dimensions completely the same.He has a lot of edema in the left leg he has  lymphedema. Asking for [name] to consider wrapping him as he cannot get his stocking on over the soft cast 11/2; comes in with moderate to large drainage slightly smaller in terms of width we have been using [place]. His wound looks satisfactory but not much improvement 11/4; patient presents today for obligatory cast change. Has no issues or complaints today. He denies signs of infection. 11/9; patient traveled this weekend to [place], was on the cast quite a bit. Staining of the cast with black material from his walking boot. Drainage was not quite as bad as we feared. Using silver alginate and Keystone 11/16; we do not have size for cast therefore we have been putting a soft cast on him since the change on Friday. Still a significant amount of drainage necessitating changing twice a week. We have been using the Keystone at cast changes either hard or soft as well as silver alginate Comes in the clinic with things actually looking fairly good improvement in width. He says his offloading is about the same 02/24/2021 upon evaluation today patient actually comes back in and is doing excellent in regard to his foot ulcer this is significantly smaller even compared to the last visit. The soft cast seems to have done extremely well for him which is great news. I do not see any signs of infection minimal debridement will be needed today. 11/30; left lateral foot much improved half a centimeter improvement in surface area. No evidence of infection. He seems to be doing better with the soft cast in the TCC therefore we will continue with this. He comes back in later in the week for a change with the nurses. This is due to drainage 12/6; no improvement in dimensions. Under illumination some debris on the surface we have been using silver alginate, soft cast. If there is anything optimistic here he seems to have have less drainage 12/13. Dimensions are improved both length and width and slightly in depth. Appears to  be quite healthy today. Raised edges of this thick skin and callus around the edges however. He is in a soft cast were bringing him back once for a change on Friday. Drainage is better 12/20. Dimensions are improved. He still has raised edges of  thick skin and callus around the edges. We are using a soft cast 12/28; comes in today with thick callus around the wound. Using silver under alginate under a soft cast. I do not think there is much improvement in any measurement 2023 04/06/2021; patient was put in a total contact cast. Unfortunately not much change in surface area 1/10; not much different still thick callus and skin around the edge in spite of the total contact cast. This was just debrided last week we have been using the [facility] compounded antibiotic and silver alginate under a total contact cast 1/18 the patient's wound on the left side is doing nicely. smaller HOWEVER he comes in today with a wound on the right foot laterally. blister most likely serosangquenous drainage 1/24; the patient continues to do well in terms of the plantar left foot which is continued to contract using silver alginate under the total contact cast HOWEVER the right lateral foot is bigger with denuded skin around the edges. I used pickups and a #15 scalpel to remove this this looks like the remanence of a large blister. Cannot rule out infection. Culture in this area I did last week showed Staphylococcus lugdunensis few colonies. I am going to try to address this with his [employer] antibiotic that is done so well on the left having linezolid and this should cover the staph Objective Constitutional Patient is hypertensive.. Pulse regular and within target range for patient.Marland Kitchen Respirations regular, non-labored and within target range.. Temperature is normal and within the target range for the patient.Marland Kitchen Appears in no distress. Vitals Time Taken: 7:50 AM, Height: 77 in, Source: Stated, Weight: 280 lbs, Source: Stated,  BMI: 33.2, Temperature: 98.7 F, Pulse: 93 bpm, Respiratory Rate: 18 breaths/min, Blood Pressure: 173/98 mmHg, Capillary Blood Glucose: 147 mg/dl. General Notes: Wound exam; his left foot continues to improve the wound is smaller. No debridement is necessary and no change in treatment. We replaced the total contact cast in standard fashion The major problem here is now the right lateral foot large area centered around the base of the fifth metatarsal. I used pickups and scissors to remove a large area of skin. There is swelling around the area. Not convinced there is active infection in the soft tissues Integumentary (Hair, Skin) Wound #3 status is Open. Original cause of wound was Trauma. The date acquired was: 10/02/2019. The wound has been in treatment 78 weeks. The wound is located on the Left,Lateral Foot. The wound measures 0.5cm length x 0.6cm width x 0.2cm depth; 0.236cm^2 area and 0.047cm^3 volume. There is Fat Layer (Subcutaneous Tissue) exposed. There is no tunneling or undermining noted. There is a medium amount of serosanguineous drainage noted. The wound margin is thickened. There is large (67-100%) red, pink granulation within the wound bed. There is no necrotic tissue within the wound bed. Wound #8 status is Open. Original cause of wound was Blister. The date acquired was: 04/18/2021. The wound is located on the Right,Lateral Foot. The wound measures 2cm length x 1.5cm width x 0.1cm depth; 2.356cm^2 area and 0.236cm^3 volume. There is Fat Layer (Subcutaneous Tissue) exposed. There is no tunneling or undermining noted. There is a medium amount of purulent drainage noted. The wound margin is distinct with the outline attached to the wound base. There is large (67-100%) pink granulation within the wound bed. There is a small (1-33%) amount of necrotic tissue within the wound bed including Adherent Slough. 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 There was a Selective/Open Wound Skin/Epidermis Debridement with a total area of 3 sq cm performed by [name], MD. With the following instrument(s): Forceps, and Scissors to remove Non-Viable tissue/material. Material removed includes Skin: Epidermis. No specimens were taken. A time out was conducted at 08:27, prior to the start of the procedure. A Minimum amount of bleeding was controlled with Pressure. The procedure was tolerated well with a pain level of 0 throughout and a pain level of 0 following the procedure. Post Debridement Measurements: 2cm length x 1.5cm width x 0.1cm depth; 0.236cm^3 volume. Character of Wound/Ulcer Post Debridement is stable. Severity of Tissue Post Debridement is: Fat layer exposed. Post procedure Diagnosis Wound #8: Same as Pre-Procedure Wound #3 Pre-procedure diagnosis of Wound #3 is a Diabetic Wound/Ulcer of the Lower Extremity located on the Left,Lateral Foot . There was a T Contact Cast otal Procedure by [name], MD. Post procedure Diagnosis Wound #3: Same as Pre-Procedure Plan Follow-up Appointments: Return Appointment in 1 week. - Dr. Arcola Jansky Shower/ Hygiene: May shower with protection but do not get wound dressing(s) wet. - Use Cast Protector Bag Edema Control - Lymphedema / SCD / Other: Elevate legs to the level of the heart or above for 30 minutes daily and/or when sitting, a frequency of: - throughout the day Avoid standing for long periods of time. Exercise regularly Moisturize legs daily. - right leg every night before bed. Compression stocking or  Garment 20-30 mm/Hg pressure to: - Apply to right leg in the morning and remove at night. Off-Loading: T Contact Cast to Left Lower Extremity otal Open toe surgical shoe to: - right foot Other: - minimal weight bearing left foot Additional Orders / Instructions: Follow Nutritious Diet Radiology ordered were: X-ray, right foot, complete view - Non healing wound on right lateral foot WOUND #3: - Foot Wound Laterality: Left, Lateral Cleanser: Soap and Water 1 x Per Week/7 Days Discharge Instructions: May shower and wash wound with dial antibacterial soap and water prior to dressing change. Topical: keystone 1 x Per Week/7 Days Prim Dressing: KerraCel Ag Gelling Fiber Dressing, 4x5 in (silver alginate) 1 x Per Week/7 Days ary Discharge Instructions: Apply silver alginate to wound bed as instructed Secondary Dressing: Woven Gauze Sponge, Non-Sterile 4x4 in 1 x Per Week/7 Days Discharge Instructions: Apply over primary dressing as directed. Secondary Dressing: Optifoam Non-Adhesive Dressing, 4x4 in 1 x Per Week/7 Days Discharge Instructions: Apply over primary dressing as directed. Com pression Wrap: Kerlix Roll 4.5x3.1 (in/yd) 1 x Per Week/7 Days Discharge Instructions: Apply Kerlix and Coban compression as directed. Com pression Wrap: Coban Self-Adherent Wrap 4x5 (in/yd) 1 x Per Week/7 Days Discharge Instructions: Apply over Kerlix as directed. WOUND #8: - Foot Wound Laterality: Right, Lateral Cleanser: Soap and Water 1 x Per Week/7 Days Discharge Instructions: May shower and wash wound with dial antibacterial soap and water prior to dressing change. Topical: keystone 1 x Per Week/7 Days Prim Dressing: KerraCel Ag Gelling Fiber Dressing, 4x5 in (silver alginate) 1 x Per Week/7 Days ary Discharge Instructions: Apply silver alginate to wound bed as instructed Secondary Dressing: Woven Gauze Sponge, Non-Sterile 4x4 in 1 x Per Week/7 Days Discharge  Instructions: Apply over primary dressing  as directed. Secondary Dressing: Optifoam Non-Adhesive Dressing, 4x4 in 1 x Per Week/7 Days Discharge Instructions: Apply over primary dressing as directed. Secured With: [employer], 4.5x3.1 (in/yd) 1 x Per Week/7 Days Discharge Instructions: Secure with Kerlix as directed. Secured With: 27M Medipore H Soft Cloth Surgical T ape, 4 x 10 (in/yd) 1 x Per Week/7 Days Discharge Instructions: Secure with tape as directed. 1. We continued with silver alginate with a total contact cast on the left. This appears to be heading towards closure 2. We are going to use his [facility] antibiotic on the right lateral foot. I am sending him for an x-ray of this today. Cannot rule out systemic antibiotics once I see the x-ray Electronic Signature(s) Signed: 04/27/2021 4:35:26 PM By: [name] MD Entered By: [name] on 04/27/2021 09:07:10 -------------------------------------------------------------------------------- Total Contact Cast Details Patient Name: Date of Service: [name], [name] 04/27/2021 7:30 A M Medical Record Number: [id number] Patient Account Number: [id number] Date of Birth/Sex: Treating RN: October 09, 1985 (36 y.o. [name] Primary Care Provider: Other Clinician: [name] Referring Provider: Treating Provider/Extender: [name] in Treatment: 78 T Contact Cast Applied for Wound Assessment: otal Wound #3 Left,Lateral Foot Performed By: Physician [name], MD Post Procedure Diagnosis Same as Pre-procedure Electronic Signature(s) Signed: 04/27/2021 4:35:26 PM By: [name] MD Entered By: [name] on 04/27/2021 09:01:51 -------------------------------------------------------------------------------- SuperBill Details Patient Name: Date of Service: [name], [name] 04/27/2021 Medical Record Number: [id number] Patient Account Number: [id number] Date of Birth/Sex: Treating RN: [birth year] (36 y.o. 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

## 2021-05-05 ENCOUNTER — Other Ambulatory Visit: Payer: Self-pay

## 2021-05-05 ENCOUNTER — Encounter (HOSPITAL_BASED_OUTPATIENT_CLINIC_OR_DEPARTMENT_OTHER): Payer: BC Managed Care – PPO | Attending: Internal Medicine | Admitting: Internal Medicine

## 2021-05-05 DIAGNOSIS — B9562 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 classified elsewhere: Secondary | ICD-10-CM | POA: Diagnosis not present

## 2021-05-05 DIAGNOSIS — L97528 Non-pressure chronic ulcer of other part of left foot with other specified severity: Secondary | ICD-10-CM | POA: Diagnosis not present

## 2021-05-05 DIAGNOSIS — E1110 Type 2 diabetes mellitus with ketoacidosis without coma: Secondary | ICD-10-CM | POA: Diagnosis not present

## 2021-05-05 DIAGNOSIS — M86572 Other chronic hematogenous osteomyelitis, left ankle and foot: Secondary | ICD-10-CM | POA: Insufficient documentation

## 2021-05-05 DIAGNOSIS — L97518 Non-pressure chronic ulcer of other part of right foot with other specified severity: Secondary | ICD-10-CM | POA: Insufficient documentation

## 2021-05-05 DIAGNOSIS — L97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: Secondary | ICD-10-CM | POA: Diagnosis not present

## 2021-05-05 DIAGNOSIS — E1151 Type 2 diabetes mellitus with diabetic peripheral angiopathy without gangrene: Secondary | ICD-10-CM | POA: Diagnosis not present

## 2021-05-05 DIAGNOSIS — E11621 Type 2 diabetes mellitus with foot ulcer: Secondary | ICD-10-CM | POA: Insufficient documentation

## 2021-05-05 DIAGNOSIS — M86171 Other acute osteomyelitis, right ankle and foot: Secondary | ICD-10-CM | POA: Diagnosis not present

## 2021-05-05 NOTE — Progress Notes (Signed)
[place], [name] ([id number]) Visit Report for 05/05/2021 Debridement Details Patient Name: Date of Service: [name] 05/05/2021 8:15 A M Medical Record Number: [id number] Patient Account Number: [id number] Date of Birth/Sex: Treating RN: Dec 28, 1985 (36 y.o. [name], [name] Primary Care Provider: Seward [name] Other Clinician: Referring Provider: Treating Provider/Extender: [name] in Treatment: 79 Debridement Performed for Assessment: Wound #3 Left,Lateral Foot Performed By: Physician [name], MD Debridement Type: Debridement Severity of Tissue Pre Debridement: Fat layer exposed Level of Consciousness (Pre-procedure): Awake and Alert Pre-procedure Verification/Time Out Yes - 08:50 Taken: Start Time: 08:50 Pain Control: Lidocaine T Area Debrided (L x W): otal 0.5 (cm) x 0.7 (cm) = 0.35 (cm) Tissue and other material debrided: Viable, Non-Viable, Callus, Subcutaneous, Skin: Dermis , Skin: Epidermis Level: Skin/Subcutaneous Tissue Debridement Description: Excisional Instrument: Curette Bleeding: Minimum Hemostasis Achieved: Pressure End Time: 08:50 Procedural Pain: 0 Post Procedural Pain: 0 Response to Treatment: Procedure was tolerated well Level of Consciousness (Post- Awake and Alert procedure): Post Debridement Measurements of Total Wound Length: (cm) 0.5 Width: (cm) 0.7 Depth: (cm) 0.4 Volume: (cm) 0.11 Character of Wound/Ulcer Post Debridement: Improved Severity of Tissue Post Debridement: Fat layer exposed Post Procedure Diagnosis Same as Pre-procedure Electronic Signature(s) Signed: 05/05/2021 4:31:34 PM By: [name] MD Signed: 05/05/2021 4:37:24 PM By: Rhae Hammock RN Entered By: [name] on 05/05/2021 08:56:34 -------------------------------------------------------------------------------- Debridement Details Patient Name: Date of Service: [name], [name] 05/05/2021 8:15 A M Medical Record  Number: [id number] Patient Account Number: [id number] Date of Birth/Sex: Treating RN: 04/01/1986 (36 y.o. [name], [name] Primary Care Provider: Seward [name] Other Clinician: Referring Provider: Treating Provider/Extender: [name] in Treatment: 79 Debridement Performed for Assessment: Wound #8 Right,Lateral Foot Performed By: Physician [name], MD Debridement Type: Debridement Severity of Tissue Pre Debridement: Fat layer exposed Level of Consciousness (Pre-procedure): Awake and Alert Pre-procedure Verification/Time Out Yes - 08:50 Taken: Start Time: 08:50 Pain Control: Lidocaine T Area Debrided (L x W): otal 3.1 (cm) x 2.7 (cm) = 8.37 (cm) Tissue and other material debrided: Viable, Non-Viable, Callus, Slough, Subcutaneous, Skin: Dermis , Skin: Epidermis, Slough Level: Skin/Subcutaneous Tissue Debridement Description: Excisional Instrument: Curette Specimen: Tissue Culture Number of Specimens T aken: 1 Bleeding: Minimum Hemostasis Achieved: Pressure End Time: 08:50 Procedural Pain: 0 Post Procedural Pain: 0 Response to Treatment: Procedure was tolerated well Level of Consciousness (Post- Awake and Alert procedure): Post Debridement Measurements of Total Wound Length: (cm) 3.1 Width: (cm) 2.7 Depth: (cm) 0.2 Volume: (cm) 1.315 Character of Wound/Ulcer Post Debridement: Improved Severity of Tissue Post Debridement: Fat layer exposed Post Procedure Diagnosis Same as Pre-procedure Electronic Signature(s) Signed: 05/05/2021 4:31:34 PM By: [name] MD Signed: 05/05/2021 4:37:24 PM By: Rhae Hammock RN Entered By: [name] on 05/05/2021 09:00:48 -------------------------------------------------------------------------------- HPI Details Patient Name: Date of Service: [name], [name] 05/05/2021 8:15 A M Medical Record Number: [id number] Patient Account Number: [id number] Date of Birth/Sex: Treating  RN: May 09, 1985 (36 y.o. 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 He comes in for review of a wound over the plantar fifth metatarsal head extending into the lateral part of the foot. He was followed for this previously by his podiatrist Dr. Cornelius Moras. As the patient tells his story he went  to see podiatry first for a swelling he developed on the lateral part of his fifth metatarsal head in May. He states this was "open" by podiatry and the area closed. He was followed up in June and it was again opened callus removed and it closed promptly. There were plans being made for surgery on the fifth metatarsal head in June however his blood sugar was apparently too high for anesthesia. Apparently the area was debrided and opened again in June and it is never closed since. Looking over the records from podiatry I am really not able to follow this. It was clear when he was first seen it was before 5/14 at that point he already had a wound. By 5/17 the ulcer was resolved. I do not see anything about a procedure. On 5/28 noted to have pre-ulcerative moderate keratosis. X-ray noted 1/5 contracted toe and tailor's bunion and metatarsal deformity. On a visit date on 09/28/2018 the dorsal part of the left foot it healed and resolved. There was concern about swelling in his lower extremity he was sent to the ER.. As far as I can tell he was seen in the ER on 7/12 with an ulcer on his left foot. A DVT rule out of the left leg was negative. I do not think I have complete records from podiatry but I am not able to verify the procedures this patient states he had. He states after the last procedure the wound has never closed although I am  not able to follow this in the records I have from podiatry. He has not had a recent x-ray The patient has been using Neosporin on the wound. He is wearing a Darco shoe. He is still very active up on his foot working and exercising. Past medical history; type 2 diabetes ketosis-prone, leg swelling with a negative DVT study in July. Non-smoker ABI in our clinic was 0.85 on the left 10/16; substantial wound on the plantar left fifth met head extending laterally almost to the dorsal fifth MTP. We have been using silver alginate we gave him a Darco forefoot off loader. An x-ray did not show evidence of osteomyelitis did note soft tissue emphysema which I think was due to gas tracking through an open wound. There is no doubt in my mind he requires an MRI 10/23; MRI not booked until 3 November at the earliest this is largely due to his glucose sensor in the right arm. We have been using silver alginate. There has been an improvement 10/29; I am still not exactly sure when his MRI is booked for. He says it is the third but it is the 10th in epic. This definitely needs to be done. He is running a low-grade fever today but no other symptoms. No real improvement in the 1 02/26/2019 patient presents today for a follow-up visit here in our clinic he is last been seen in the clinic on October 29. Subsequently we were working on getting MRI to evaluate and see what exactly was going on and where we would need to go from the standpoint of whether or not he had osteomyelitis and again what treatments were going be required. Subsequently the patient ended up being admitted to the hospital on 02/07/2019 and was discharged on 02/14/2019. This is a somewhat interesting admission with a discharge diagnosis of pneumonia due to COVID-19 although he was positive for COVID-19 when tested at the urgent care but negative x2 when he was actually in the hospital. With that being  said he did have acute respiratory failure with  hypoxia and it was noted he also have a left foot ulceration with osteomyelitis. With that being said he did require oxygen for his pneumonia and I level 4 L. He was placed on antivirals and steroids for the COVID-19. He was also transferred to the [facility] at one point. Nonetheless he did subsequently discharged home and since being home has done much better in that regard. The CT angiogram did not show any pulmonary embolism. With regard to the osteomyelitis the patient was placed on vancomycin and Zosyn while in the hospital but has been changed to Augmentin at discharge. It was also recommended that he follow- up with wound care and podiatry. Podiatry however wanted him to see [name] according to the patient prior to them doing anything further. His hemoglobin A1c was 9.9 as noted in the hospital. Have an MRI of the left foot performed while in the hospital on 02/04/2019. 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 There is an overlying plantar open wound noted an abscess tracking back along the lateral aspect of the fifth metatarsal shaft. There is otherwise diffuse cellulitis and mild fasciitis without findings of polymyositis. The patient did have recently pneumonia secondary to COVID-19 I looked in the chart through epic and it does appear that the patient may need to have an additional x-ray just to ensure everything is cleared and that he has no airspace disease prior to putting him into the [name]. 03/05/2019; patient was readmitted to the clinic last week. He was hospitalized twice for a viral upper respiratory tract infection from 11/1 through 11/4 and then 11/5 through 11/12 ultimately this turned out to be Covid pneumonitis. Although he was discharged on oxygen he is not using it. He says he feels fine. He has no exercise limitation no cough no sputum. His O2 sat in our clinic today was  100% on room air. 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 He received Vanco and Zosyn in the hospital and then was discharged on 2 weeks of Augmentin. I do not see any relevant cultures. He was supposed to follow-up with infectious disease but I do not see that he has an appointment. 12/8; patient saw Dr. Novella Olive of infectious disease last week. He felt that he had had adequate antibiotic therapy. He did not go to follow-up with Dr. Amalia Hailey of podiatry and I have again talked to him about the pros and cons of this. He does not want to consider a ray amputation of this time. He is aware of the risks of recurrence, migration etc. He started HBO today and tolerated this well. He can complete the Augmentin that I gave him last week. I have looked over the lab work that Dr. Chana Bode ordered his C-reactive protein was 3.3 and his sedimentation rate was 17. The C-reactive protein is never really been measurably that high in this patient 12/15; not much change in the wound today however he has undermining along the lateral part of the foot again more extensively than last week. He has some rims of epithelialization. We have been using silver alginate. He is undergoing hyperbarics but did not dive today 12/18; in for his obligatory first total contact cast change. Unfortunately there was pus coming from the undermining area around his  fifth metatarsal head. This was cultured but will preclude reapplication of a cast. He is seen in conjunction with HBO 12/24; patient had staph lugdunensis in the wound in the undermining area laterally last time. We put him on doxycycline which should have covered this. The wound looks better today. I am going to give him another week of doxycycline before reattempting the total contact cast 12/31; the patient is completing antibiotics. Hemorrhagic debris in the  distal part of the wound with some undermining distally. He also had hyper granulation. Extensive debridement with a #5 curette. The infected area that was on the lateral part of the fifth met head is closed over. I do not think he needs any more antibiotics. Patient was seen prior to HBO. Preparations for a total contact cast were made in the cast will be placed post hyperbarics 04/11/19; once again the patient arrives today without complaint. He had been in a cast all week noted that he had heavy drainage this week. This resulted in large raised areas of macerated tissue around the wound 1/14; wound bed looks better slightly smaller. Hydrofera Blue has been changing himself. He had a heavy drainage last week which caused a lot of maceration around the wound so I took him out of a total contact cast he says the drainage is actually better this week He is seen today in conjunction with HBO 1/21; returns to clinic. He was up in [place] for a day or 2 attending a funeral. He comes back in with the wound larger and with a large area of exposed bone. He had osteomyelitis and septic arthritis of the fifth left metatarsal head while he was in hospital. He received IV antibiotics in the hospital for a prolonged period of time then 3 weeks of Augmentin. Subsequently I gave him 2 weeks of doxycycline for more superficial wound infection. When I saw this last week the wound was smaller the surface of the wound looks satisfactory. 1/28; patient missed hyperbarics today. Bone biopsy I did last time showed Enterococcus faecalis and Staphylococcus lugdunensis . He has a wide area of exposed bone. We are going to use silver alginate as of today. I had another ethical discussion with the patient. 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 In this situation I think the likelihood of healing this is low. Therefore I have recommended a ray amputation and with the patient's agreement I  have referred him to Dr. Doran Durand. The other issue is that his compliance with hyperbarics has been minimal because of his work schedule and given his underlying decision I am going to stop this today READMISSION 10/24/2019 MRI 09/29/2019 left foot IMPRESSION: 1. Apparent skin ulceration inferior and lateral to the 5th metatarsal base with underlying heterogeneous T2 signal and enhancement in the subcutaneous fat. Small peripherally enhancing fluid collections along the plantar and lateral aspects of the 5th metatarsal base suspicious for abscesses. 2. Interval amputation through the mid 5th metatarsal with nonspecific low-level marrow edema and enhancement. Given the proximity to the adjacent soft tissue inflammatory changes, osteomyelitis cannot be excluded. 3. The additional bones appear unremarkable. MRI 09/29/2019 right foot IMPRESSION: 1. Soft tissue ulceration lateral to the 5th MTP joint. There is low-level T2 hyperintensity within the 4th and 5th metatarsal heads and adjacent proximal phalanges without abnormal T1 signal or cortical destruction. These findings are nonspecific and could be seen with early marrow edema, hyperemia or early osteomyelitis. No evidence of septic joint. 2. Mild tenosynovitis and synovial  enhancement associated with the extensor digitorum tendons at the level of the midfoot. 3. Diffuse low-level muscular T2 hyperintensity and enhancement, most consistent with diabetic myopathy. LEFT FOOT BONE Methicillin resistant staphylococcus aureus Staphylococcus lugdunensis MIC MIC CIPROFLOXACIN >=8 RESISTANT Resistant <=0.5 SENSI... Sensitive CLINDAMYCIN <=0.25 SENS... Sensitive >=8 RESISTANT Resistant ERYTHROMYCIN >=8 RESISTANT Resistant >=8 RESISTANT Resistant GENTAMICIN <=0.5 SENSI... Sensitive <=0.5 SENSI... Sensitive Inducible Clindamycin NEGATIVE Sensitive NEGATIVE Sensitive OXACILLIN >=4 RESISTANT Resistant 2 SENSITIVE Sensitive RIFAMPIN <=0.5 SENSI...  Sensitive <=0.5 SENSI... Sensitive TETRACYCLINE <=1 SENSITIVE Sensitive <=1 SENSITIVE Sensitive TRIMETH/SULFA <=10 SENSIT Sensitive <=10 SENSIT Sensitive ... Marland Kitchen.. VANCOMYCIN 1 SENSITIVE Sensitive <=0.5 SENSI... Sensitive Right foot bone . Component 3 wk ago Specimen Description BONE Special Requests RIGHT 4 METATARSAL SAMPLE B Gram Stain NO WBC SEEN NO ORGANISMS SEEN Culture RARE METHICILLIN RESISTANT STAPHYLOCOCCUS AUREUS NO ANAEROBES ISOLATED Performed at [facility], [place] [address] [place], [place] [address] Report Status 10/08/2019 FINAL Organism ID, Bacteria METHICILLIN RESISTANT STAPHYLOCOCCUS AUREUS Resulting Agency [facility] Susceptibility Methicillin resistant staphylococcus aureus MIC CIPROFLOXACIN >=8 RESISTANT Resistant CLINDAMYCIN <=0.25 SENS... Sensitive ERYTHROMYCIN >=8 RESISTANT Resistant GENTAMICIN <=0.5 SENSI... Sensitive Inducible Clindamycin NEGATIVE Sensitive OXACILLIN >=4 RESISTANT Resistant RIFAMPIN <=0.5 SENSI... Sensitive TETRACYCLINE <=1 SENSITIVE Sensitive TRIMETH/SULFA <=10 SENSIT Sensitive ... VANCOMYCIN 1 SENSITIVE Sensitive This is a patient we had in clinic earlier this year with a wound over his left fifth metatarsal head. 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 Doran Durand on 05/16/2019. He seems to have developed problems with wounds on his bilateral feet in June 2021 although he may have had problems earlier than this. He was in an urgent care with a right foot ulcer on 09/26/2019 and given a course of doxycycline. This was apparently after having trouble getting into see orthopedics. He was seen by podiatry on 09/28/2019 noted to have bilateral lower extremity  ulcers including the left lateral fifth metatarsal base and the right subfifth met head. It was noted that had purulent drainage at that time. He required hospitalization from 6/20 through 7/2. This was because of worsening right foot wounds. He underwent bilateral operative incision and drainage and bone biopsies bilaterally. Culture results are listed above. He has been referred back to clinic by Dr. Jacqualyn Posey of podiatry. He is also followed by Dr. Megan Salon who saw him yesterday. He was discharged from hospital on Zyvox Flagyl and Levaquin and yesterday changed to doxycycline Flagyl and Levaquin. His inflammatory markers on 6/26 showed a sedimentation rate of 129 and a C-reactive protein of 5. This is improved to 14 and 1.3 respectively. This would indicate improvement. ABIs in our clinic today were 1.23 on the right and 1.20 on the left 11/01/2019 on evaluation today patient appears to be doing fairly well in regard to the wounds on his feet at this point. Fortunately there is no signs of active infection at this time. No fevers, chills, nausea, vomiting, or diarrhea. He currently is seeing infectious disease and still under their care at this point. Subsequently he also has both wounds which she has not been using collagen on as he did not receive that in his packaging he did not call us and let us know that. Apparently that just was missed on the order. Nonetheless we will get  that straightened out today. 8/9-Patient returns for bilateral foot wounds, using Prisma with hydrogel moistened dressings, and the wounds appear stable. Patient using surgical shoes, avoiding much pressure or weightbearing as much as possible 8/16; patient has bilateral foot wounds. 1 on the right lateral foot proximally the other is on the left mid lateral foot. Both required debridement of callus and thick skin around the wounds. We have been using silver collagen 8/27; patient has bilateral lateral foot wounds. The area  on the left substantially surrounded by callus and dry skin. This was removed from the wound edge. The underlying wound is small. The area on the right measured somewhat smaller today. 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 Unfortunately I did not update his antibiotics during today's visit. 9/10 I reviewed Dr. Hale Bogus last notes he felt he had completed antibiotics his inflammatory markers were reasonably well controlled. He has a small wound on the lateral left foot and a tiny area on the right which is just above closed. He is using Hydrofera Blue with border foam he has bilateral surgical shoes 9/24; 2 week f/u. doing well. right foot is closed. left foot still undermined. 10/14; right foot remains closed at the fifth met head. The area over the base of the left fifth metatarsal has a small open area but considerable undermining towards the plantar foot. Thick callus skin around this suggests an adequate pressure relief. We have talked about this. He says he is going to go back into his cam boot. I suggested a total contact cast he did not seem enamored with this suggestion 10/26; left foot base of the fifth metatarsal. Same condition as last time. He has skin over the area with an open wound however the skin is not adherent. He went to see Dr. Earleen Newport who did an x-ray and culture of his foot I have not reviewed the x-ray but the patient was not told anything. He is on doxycycline 11/11; since the patient was last here he was in the emergency room on 10/30 he was concerned about swelling in the left foot. They did not do any cultures or x-rays. They changed his antibiotics to cephalexin. Previous culture showed group B strep. The cephalexin is appropriate as doxycycline has less than predictable coverage. Arrives in clinic today with swelling over this area under the wound. He also has a new wound on the right fifth metatarsal  head 11/18; the patient has a difficult wound on the lateral aspect of the left fifth metatarsal head. The wound was almost ballotable last week I opened it slightly expecting to see purulence however there was just bleeding. I cultured this this was negative. X-ray unchanged. We are trying to get an MRI but I am not sure were going to be able to get this through his insurance. He also has an area on the right lateral fifth metatarsal head this looks healthier 12/3; the patient finally got our MRI. Surprisingly this did not show osteomyelitis. I did show the soft tissue ulceration at the lateral plantar aspect of the fifth metatarsal base with a tiny residual 6 mm abscess overlying the superficial fascia I have tried to culture this area I have not been able to get this to grow anything. Nevertheless the protruding tissue looks aggravated. I suspect we should try to treat the underlying "abscess with broad-spectrum antibiotics. I am going to start him on Levaquin and Flagyl. He has much less edema  in his legs and I am going to continue to wrap his legs and see him weekly 12/10. I started Levaquin and Flagyl on him last week. He just picked up the Flagyl apparently there was some delay. The worry is the wound on the left fifth metatarsal base which is substantial and worsening. His foot looks like he inverts at the ankle making this a weightbearing surface. Certainly no improvement in fact I think the measurements of this are somewhat worse. We have been using 12/17; he apparently just got the Levaquin yesterday this is 2 weeks after the fact. He has completed the Flagyl. The area over the left fifth metatarsal base still has protruding granulation tissue although it does not look quite as bad as it did some weeks ago. He has severe bilateral lymphedema although we have not been treating him for wounds on his legs this is definitely going to require compression. There was so much edema in the left I did  not wish to put him in a total contact cast today. I am going to increase his compression from 3-4 layer. The area on the right lateral fifth met head actually look quite good and superficial. 12/23; patient arrived with callus on the right fifth met head and the substantial hyper granulated callused wound on the base of his fifth metatarsal. He says he is completing his Levaquin in 2 days but I do not think that adds up with what I gave him but I will have to double check this. We are using Hydrofera Blue on both areas. My plan is to put the left leg in a cast the week after New Year's 04/06/2020; patient's wounds about the same. Right lateral fifth metatarsal head and left lateral foot over the base of the fifth metatarsal. There is undermining on the left lateral foot which I removed before application of total contact cast continuing with Hydrofera Blue new. Patient tells me he was seen by endocrinology today lab work was done [Dr. Kerr]. Also wondering whether he was referred to cardiology. I went over some lab work from previously does not have chronic renal failure certainly not nephrotic range proteinuria he does have very poorly controlled diabetes but this is not his most updated lab work. Hemoglobin A1c has been over 11 1/10; the patient had a considerable amount of leakage towards mid part of his left foot with macerated skin however the wound surface looks better the area on the right lateral fifth met head is better as well. I am going to change the dressing on the left foot under the total contact cast to silver alginate, continue with Hydrofera Blue on the right. 1/20; patient was in the total contact cast for 10 days. Considerable amount of drainage although the skin around the wound does not look too bad on the left foot. The area on the right fifth metatarsal head is closed. Our nursing staff reports large amount of drainage out of the left lateral foot wound 1/25; continues with  copious amounts of drainage described by our intake staff. PCR culture I did last week showed E. coli and Enterococcus faecalis and low quantities. Multiple resistance genes documented including extended spectrum beta lactamase, MRSA, MRSE, quinolone, tetracycline. The wound is not quite as good this week as it was 5 days ago but about the same size 2/3; continues with copious amounts of malodorous drainage per our intake nurse. The PCR culture I did 2 weeks ago showed E. coli and low quantities of Enterococcus.  There were multiple resistance genes detected. I put Neosporin on him last week although this does not seem to have helped. The wound is slightly deeper today. Offloading continues to be an issue here although with the amount of drainage she has a total contact cast is just not going to work 2/10; moderate amount of drainage. Patient reports he cannot get his stocking on over the dressing. I told him we have to do that the nurse gave him suggestions on how to make this work. The wound is on the bottom and lateral part of his left foot. Is cultured predominantly grew low amounts of Enterococcus, E. coli and anaerobes. There were multiple resistance genes detected including extended spectrum beta lactamase, quinolone, tetracycline. I could not think of an easy oral combination to address this so for now I am going to do topical antibiotics provided by [facility] I think the main agents here are vancomycin and an aminoglycoside. We have to be able to give him access to the wounds to get the topical antibiotic on 2/17; moderate amount of drainage this is unchanged. He has his Keystone topical antibiotic against the deep tissue culture organisms. He has been using this and changing the dressing daily. Silver alginate on the wound surface. 2/24; using Keystone antibiotic with silver alginate on the top. He had too much drainage for a total contact cast at one point although I think that is  improving and I think in the next week or 2 it might be possible to replace a total contact cast I did not do this today. In general the wound surface looks healthy however he continues to have thick rims of skin and subcutaneous tissue around the wide area of the circumference which I debrided 06/04/2020 upon evaluation today patient appears to be doing well in regard to his wound. I do feel like he is showing signs of improvement. There is little bit of callus and dead tissue around the edges of the wound as well as what appears to be a little bit of a sinus tract that is off to the side laterally I would perform debridement to clear that away today. 3/17; left lateral foot. The wound looks about the same as I remember. Not much depth surface looks healthy. No evidence of infection 3/25; left lateral foot. Wound surface looks about the same. Separating epithelium from the circumference. There really is no evidence of infection here however not making progress by my view 3/29; left lateral foot. Surface of the wound again looks reasonably healthy still thick skin and subcutaneous tissue around the wound margins. There is no evidence of infection. One of the concerns being brought up by the nurses has again the amount of drainage vis--vis continued use of a total contact cast 4/5; left lateral foot at roughly the base of the fifth metatarsal. Nice healthy looking granulated tissue with rims of epithelialization. The overall wound measurements are not any better but the tissue looks healthy. The only concern is the amount of drainage although he has no surrounding maceration with what we have been doing recently to absorb fluid and protect his skin. He also has lymphedema. 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 He states that the kerlix/coban under  the TCC rolled down and could not get it back up. He has been using an offloading scooter and has somehow hurt his right foot using this device. This happened last week. He states that the side of his right foot developed a blister and opened. The top of his foot also has a few small open wounds he thinks is due to his socks rubbing in his shoes. He has not been using any dressings to the wound. He denies purulent drainage, fever/chills or erythema to the wounds. 4/22; patient presents for 1 week follow-up. He developed new wounds to the right foot that were evaluated at last clinic visit. He continues to have a total contact cast to the left leg and he reports no issues. He has been using silver collagen to the right foot wounds with no issues. He denies purulent drainage, fever/chills or erythema to the right foot wounds. He has no complaints today 4/25; patient presents for 1 week follow-up. He has a total contact cast of the left leg and reports no issues. He has been using silver alginate to the right foot wound. He denies purulent drainage, fever/chills or erythema to the right foot wounds. 5/2 patient presents for 1 week follow-up. T contact cast on the left. The wound which is on the base of the plantar foot at the base of the fifth metatarsal otal actually looks quite good and dimensions continue to gradually contract. HOWEVER the area on the right lateral fifth metatarsal head is much larger than what I remember from 2 weeks ago. Once more is he has significant levels of hypergranulation. Noteworthy that he had this same hyper granulated response on his wound on the left foot at one point in time. So much so that he I thought there was an underlying fluid collection. Based on this I think this just needs debridement. 5/9; the wound on the left actually continues to be gradually smaller with a  healthy surface. Slight amount of drainage and maceration of the skin around but not too bad. However he has a large wound over the right fifth metatarsal head very much in the same configuration as his left foot wound was initially. I used silver nitrate to address the hyper granulated tissue no mechanical debridement 5/16; area on the left foot did not look as healthy this week deeper thick surrounding macerated skin and subcutaneous tissue. The area on the right foot fifth met head was about the same The area on the right ankle that we identified last week is completely broken down into an open wound presumably a stocking rubbing issue 5/23; patient has been using a total contact cast to the left side. He has been using silver alginate underneath. He has also been using silver alginate to the right foot wounds. He has no complaints today. He denies any signs of infection. 5/31; the left-sided wound looks some better measure smaller surface granulation looks better. We have been using silver alginate under the total contact cast The large area on his right fifth met head and right dorsal foot look about the same still using silver alginate 6/6; neither side is good as I was hoping although the surface area dimensions are better. A lot of maceration on his left and right foot around the wound edge. Area on the dorsal right foot looks better. He says he was traveling. I am not sure what does the amount of maceration around the plantar wounds may be drainage issues 6/13; in general the wound surfaces  look quite good on both sides. Macerated skin and raised edges around the wound required debridement although in general especially on the left the surface area seems improved. The area on the right dorsal ankle is about the same I thought this would not be such a problem to close 6/20; not much change in either wound although the one on the right looks a little better. Both wounds have thick macerated  edges to the skin requiring debridements. We have been using silver alginate. The area on the dorsal right ankle is still open I thought this would be closed. 6/28; patient comes in today with a marked deterioration in the right foot wound fifth met head. Wide area of exposed bone this is a drastic change from last time. The area on the left there we have been casting is stagnant. We have been using silver alginate in both wound areas. 7/5; bone culture I did for PCR last time was positive for Pseudomonas, group B strep, Enterococcus and Staph aureus. There was no suggestion of methicillin resistance or ampicillin resistant genes. This was resistant to tetracycline however He comes into the clinic today with the area over his right plantar fifth metatarsal head which had been doing so well 2 weeks ago completely necrotic feeling bone. I do not know that this is going to be salvageable. The left foot wound is certainly no smaller but it has a better surface and is superficial. 7/8; patient called in this morning to say that his total contact cast was rubbing against his foot. He states he is doing fine overall. He denies signs of infection. 7/12; continued deterioration in the wound over the right fifth metatarsal head crumbling bone. This is not going to be salvageable. The patient agrees and wants to be referred to Dr. Doran Durand which we will attempt to arrange as soon as possible. I am going to continue him on antibiotics as long as that takes so I will renew those today. The area on the left foot which is the base of the fifth metatarsal continues to look somewhat better. Healthy looking tissue no depth no debridement is necessary here. 7/20; the patient was kindly seen by Dr. Doran Durand of orthopedics on 10/19/2020. 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 Towards this end we have taken him out of the total  contact cast on the left we will put him in a wrap with Hydrofera Blue. As I understand things surgery is planned for 7/21 7/27; patient had his surgery last Thursday. 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 Apparently everything went well we did not still disturb that today The area on the left foot actually looks quite good. He has been much less mobile which probably explains this he did not seem to do well in the total contact cast secondary to drainage and maceration I think. 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 Fortunately there is no evidence of active infection at this time. No fevers, chills, nausea, vomiting, or diarrhea. Overall I think that he is actually doing extremely well. Nonetheless I do believe that he is staying off of this more following the surgery in his right foot that is the reason the left is doing so great. 8/16; left plantar foot wound. This looks smaller  than the last time I saw this he is using Hydrofera Blue. The surgical wound on the right foot is being followed by Dr. Doran Durand we did not look at this today. He has surgical shoes on both feet 8/23; left plantar foot wound not as good this week. Surrounding macerated skin and subcutaneous tissue everything looks moist and wet. I do not think he is offloading this adequately. He is using a surgical shoe Apparently the right foot surgical wound is not open although I did not check his foot 8/31; left plantar foot lateral aspect. Much improved this week. He has no maceration. Some improvement in the surface area of the wound but most impressively the depth is come in we are using silver alginate. The patient is a Product/process development scientist. He is asked that we write him a letter so he can go back to work. I have also tried to see if we can write something that will allow him to limit the amount of time that he is  on his foot at work. 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 Hopefully the latter can be avoided 9/6; I believe the patient missed an appointment last week. He arrives in today with a wound looking roughly the same certainly no better. Undermining laterally and also inferiorly. We used molecuLight today in training with the patient's permission.. We are using silver alginate 9/21 wound is measuring bigger this week although this may have to do with the aggressive circumferential debridement last week in response to the blush fluorescence on the MolecuLight. Culture I did last week showed significant MSSA and E. coli. I put him on Augmentin but he has not started it yet. We are also going to send this for compounded antibiotics at [facility]. There is no evidence of systemic infection 9/29; silver alginate. His Keystone arrived. He is completing Augmentin in 2 days. Offloading in a cam boot. Moderate drainage per our intake staff 10/5; using silver alginate. He has been using his [place]. He has completed his Augmentin. Per our intake nurse still a lot of drainage, far too much to consider a total contact cast. Wound measures about the same. He had the same undermining area that I defined last week from a roughly 11-3. I remove this today 10/12; using silver alginate he is using the [place]. He comes in for a nurse visit hence we are applying [employer] twice a week. Measuring slightly better today and less notable drainage. Extensive debridement of the wound edge last time 10/18; using topical Keystone and silver alginate and a soft cast. Wound measurements about the same. Drainage was through his soft cast. We are changing this twice a week Tuesdays and Friday 10/25; comes in with moderate drainage. Still using Keystone silver alginate and a soft cast. Wound dimensions completely the same.He has a lot of edema in the left  leg he has lymphedema. Asking for [name] to consider wrapping him as he cannot get his stocking on over the soft cast 11/2; comes in with moderate to large drainage slightly smaller in terms of width we have been using [place]. His wound looks satisfactory but not much improvement 11/4; patient presents today for obligatory cast change. Has no issues or complaints today. He denies signs of infection. 11/9; patient traveled this weekend to [place], was on the cast quite a bit. Staining of the cast with black material from his walking boot. Drainage  was not quite as bad as we feared. Using silver alginate and Keystone 11/16; we do not have size for cast therefore we have been putting a soft cast on him since the change on Friday. Still a significant amount of drainage necessitating changing twice a week. We have been using the Keystone at cast changes either hard or soft as well as silver alginate Comes in the clinic with things actually looking fairly good improvement in width. He says his offloading is about the same 02/24/2021 upon evaluation today patient actually comes back in and is doing excellent in regard to his foot ulcer this is significantly smaller even compared to the last visit. The soft cast seems to have done extremely well for him which is great news. I do not see any signs of infection minimal debridement will be needed today. 11/30; left lateral foot much improved half a centimeter improvement in surface area. No evidence of infection. He seems to be doing better with the soft cast in the TCC therefore we will continue with this. He comes back in later in the week for a change with the nurses. This is due to drainage 12/6; no improvement in dimensions. Under illumination some debris on the surface we have been using silver alginate, soft cast. If there is anything optimistic here he seems to have have less drainage 12/13. Dimensions are improved both length and width and slightly in depth.  Appears to be quite healthy today. Raised edges of this thick skin and callus around the edges however. He is in a soft cast were bringing him back once for a change on Friday. Drainage is better 12/20. Dimensions are improved. He still has raised edges of thick skin and callus around the edges. We are using a soft cast 12/28; comes in today with thick callus around the wound. Using silver under alginate under a soft cast. I do not think there is much improvement in any measurement 2023 04/06/2021; patient was put in a total contact cast. Unfortunately not much change in surface area 1/10; not much different still thick callus and skin around the edge in spite of the total contact cast. This was just debrided last week we have been using the [facility] compounded antibiotic and silver alginate under a total contact cast 1/18 the patient's wound on the left side is doing nicely. smaller HOWEVER he comes in today with a wound on the right foot laterally. blister most likely serosangquenous drainage 1/24; the patient continues to do well in terms of the plantar left foot which is continued to contract using silver alginate under the total contact cast HOWEVER the right lateral foot is bigger with denuded skin around the edges. I used pickups and a #15 scalpel to remove this this looks like the remanence of a large blister. Cannot rule out infection. Culture in this area I did last week showed Staphylococcus lugdunensis few colonies. I am going to try to address this with his [employer] antibiotic that is done so well on the left having linezolid and this should cover the staph 2/1; the patient's wound on his left foot which was the original plantar foot wound thick skin and eschar around the edges even in the total contact cast but the wound surface does not look too bad The real problem is on how his right lateral foot at roughly the base of the fifth metatarsal. The wound is completely necrotic more  worrisome than that there is swelling around the edges of this. We  have been using silver alginate on both wounds and Keystone on the right foot. Unfortunately I think he is going to require systemic antibiotics while we await cultures. He did not get the x-ray done that we ordered last week [lost the prescription] Electronic Signature(s) Signed: 05/05/2021 4:31:34 PM By: [name] MD Entered By: [name] on 05/05/2021 08:58:29 -------------------------------------------------------------------------------- Physical Exam Details Patient Name: Date of Service: [name], [name] 05/05/2021 8:15 A M Medical Record Number: [id number] Patient Account Number: [id number] Date of Birth/Sex: Treating RN: [birth year] (36 y.o. [name] Primary Care Provider: Seward [name] Other Clinician: Referring Provider: Treating Provider/Extender: [name] in Treatment: 63 Constitutional Patient is hypertensive.. Pulse regular and within target range for patient.Marland Kitchen Respirations regular, non-labored and within target range.. Temperature is normal and within the target range for the patient.Marland Kitchen Appears in no distress. Notes Wound exam His left foot looks about the same as last week however he has thick skin eschar around the edges of this I remove this with a #3 curette managing to get into subcutaneous tissue around the wound circumference. Hemostasis with direct pressure and a pressure dressing The real issue is on the right lateral foot. Substantial wound completely necrotic surface. I am concerned about the swelling and discoloration around the wound. I used a #5 curette to debride the surface. Discovered a venous bleeder that required silver nitrate Gelfoam and a pressure dressing. Post debridement I did a swab culture for CandS. Electronic Signature(s) Signed: 05/05/2021 4:31:34 PM By: [name] MD Entered By: [name] on 05/05/2021  09:00:10 -------------------------------------------------------------------------------- Physician Orders Details Patient Name: Date of Service: [name], [name] 05/05/2021 8:15 A M Medical Record Number: [id number] Patient Account Number: [id number] Date of Birth/Sex: Treating RN: 14-Jun-1985 (36 y.o. [name] Primary Care Provider: Seward [name] Other Clinician: Referring Provider: Treating Provider/Extender: [name] in Treatment: 73 Verbal / Phone Orders: No Diagnosis Coding Follow-up Appointments ppointment in 1 week. - Dr. Dellia Nims Return A Bathing/ Shower/ Hygiene May shower with protection but do not get wound dressing(s) wet. - Use Cast Protector Bag Edema Control - Lymphedema / SCD / Other Bilateral Lower Extremities Elevate legs to the level of the heart or above for 30 minutes daily and/or when sitting, a frequency of: - throughout the day Avoid standing for long periods of time. Exercise regularly Moisturize legs daily. - right leg every night before bed. Compression stocking or Garment 20-30 mm/Hg pressure to: - Apply to right leg in the morning and remove at night. Off-Loading Total Contact Cast to Left Lower Extremity Open toe surgical shoe to: - right foot Other: - minimal weight bearing left foot Additional Orders / Instructions Follow Nutritious Diet Wound Treatment Wound #3 - Foot Wound Laterality: Left, Lateral Cleanser: Soap and Water 1 x Per Week/7 Days Discharge Instructions: May shower and wash wound with dial antibacterial soap and water prior to dressing change. Topical: keystone 1 x Per Week/7 Days Prim Dressing: KerraCel Ag Gelling Fiber Dressing, 4x5 in (silver alginate) 1 x Per Week/7 Days ary Discharge Instructions: Apply silver alginate to wound bed as instructed Secondary Dressing: Woven Gauze Sponge, Non-Sterile 4x4 in 1 x Per Week/7 Days Discharge Instructions: Apply over primary dressing as  directed. Secondary Dressing: Optifoam Non-Adhesive Dressing, 4x4 in 1 x Per Week/7 Days Discharge Instructions: Apply over primary dressing as directed. Compression Wrap: Kerlix Roll 4.5x3.1 (in/yd) 1 x Per Week/7 Days Discharge Instructions: Apply Kerlix and Coban compression  as directed. Compression Wrap: Coban Self-Adherent Wrap 4x5 (in/yd) 1 x Per Week/7 Days Discharge Instructions: Apply over Kerlix as directed. Wound #8 - Foot Wound Laterality: Right, Lateral Cleanser: Soap and Water 1 x Per Week/7 Days Discharge Instructions: May shower and wash wound with dial antibacterial soap and water prior to dressing change. Topical: keystone 1 x Per Week/7 Days Prim Dressing: KerraCel Ag Gelling Fiber Dressing, 4x5 in (silver alginate) 1 x Per Week/7 Days ary Discharge Instructions: Apply silver alginate to wound bed as instructed Secondary Dressing: Woven Gauze Sponge, Non-Sterile 4x4 in 1 x Per Week/7 Days Discharge Instructions: Apply over primary dressing as directed. Secondary Dressing: Optifoam Non-Adhesive Dressing, 4x4 in 1 x Per Week/7 Days Discharge Instructions: Apply over primary dressing as directed. Secured With: [employer], 4.5x3.1 (in/yd) 1 x Per Week/7 Days Discharge Instructions: Secure with Kerlix as directed. Secured With: 63M Medipore H Soft Cloth Surgical T ape, 4 x 10 (in/yd) 1 x Per Week/7 Days Discharge Instructions: Secure with tape as directed. Laboratory naerobe culture (MICRO) - right foot Bacteria identified in Unspecified specimen by A LOINC Code: 218-2 Convenience Name: Anerobic culture Electronic Signature(s) Signed: 05/05/2021 4:31:34 PM By: [name] MD Signed: 05/05/2021 4:37:24 PM By: Rhae Hammock RN Signed: 05/05/2021 4:37:24 PM By: Rhae Hammock RN Previous Signature: 05/05/2021 8:55:22 AM Version By: [name] MD Entered By: Rhae Hammock on 05/05/2021  08:56:03 -------------------------------------------------------------------------------- Problem List Details Patient Name: Date of Service: [name], [name] 05/05/2021 8:15 A M Medical Record Number: [id number] Patient Account Number: [id number] Date of Birth/Sex: Treating RN: 07-04-1985 (36 y.o. [name], [name] Primary Care Provider: Seward [name] Other Clinician: Referring Provider: Treating Provider/Extender: [name] in Treatment: 79 Active Problems ICD-10 Encounter Code Description Active Date MDM Diagnosis E11.621 Type 2 diabetes mellitus with foot ulcer 10/24/2019 No Yes L97.528 Non-pressure chronic ulcer of other part of left foot with other specified 10/24/2019 No Yes severity L97.518 Non-pressure chronic ulcer of other part of right foot with other specified 10/24/2019 No Yes severity Inactive Problems ICD-10 Code Description Active Date Inactive Date L97.518 Non-pressure chronic ulcer of other part of right foot with other specified severity 07/14/2020 07/14/2020 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, right ankle and foot 10/24/2019 10/24/2019 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 08/10/2020 08/10/2020 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, left ankle and foot 10/24/2019 10/24/2019 B95.62 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 10/24/2019 10/24/2019 classified elsewhere Resolved Problems Electronic Signature(s) Signed: 05/05/2021 4:31:34 PM By: [name] MD Entered By: [name] on 05/05/2021 08:55:55 -------------------------------------------------------------------------------- Progress Note Details Patient Name: Date of Service: [name], [name] 05/05/2021 8:15 A M Medical Record Number: [id number] Patient Account Number: [id number] Date of Birth/Sex: Treating RN: Jan 05, 1986 (36 y.o. 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 He comes in for review of a wound over the plantar fifth metatarsal head extending into the lateral part of the foot. He was followed for this previously by his podiatrist Dr. Cornelius Moras. As the patient tells his story he went to see podiatry first for a swelling he developed on the lateral part of his fifth metatarsal head in May. He states this was "open" by podiatry and the area closed. He was followed up  in June and it was again opened callus removed and it closed promptly. There were plans being made for surgery on the fifth metatarsal head in June however his blood sugar was apparently too high for anesthesia. Apparently the area was debrided and opened again in June and it is never closed since. Looking over the records from podiatry I am really not able to follow this. It was clear when he was first seen it was before 5/14 at that point he already had a wound. By 5/17 the ulcer was resolved. I do not see anything about a procedure. On 5/28 noted to have pre-ulcerative moderate keratosis. X-ray noted 1/5 contracted toe and tailor's bunion and metatarsal deformity. On a visit date on 09/28/2018 the dorsal part of the left foot it healed and resolved. There was concern about swelling in his lower extremity he was sent to the ER.. As far as I can tell he was seen in the ER on 7/12 with an ulcer on his left foot. A DVT rule out of the left leg was negative. I do not think I have complete records from podiatry but I am not able to verify the procedures this patient states he had. He states after the last procedure the wound has never closed although I am not able to follow this in the records I have from podiatry. He has not had a recent  x-ray The patient has been using Neosporin on the wound. He is wearing a Darco shoe. He is still very active up on his foot working and exercising. Past medical history; type 2 diabetes ketosis-prone, leg swelling with a negative DVT study in July. Non-smoker ABI in our clinic was 0.85 on the left 10/16; substantial wound on the plantar left fifth met head extending laterally almost to the dorsal fifth MTP. We have been using silver alginate we gave him a Darco forefoot off loader. An x-ray did not show evidence of osteomyelitis did note soft tissue emphysema which I think was due to gas tracking through an open wound. There is no doubt in my mind he requires an MRI 10/23; MRI not booked until 3 November at the earliest this is largely due to his glucose sensor in the right arm. We have been using silver alginate. There has been an improvement 10/29; I am still not exactly sure when his MRI is booked for. He says it is the third but it is the 10th in epic. This definitely needs to be done. He is running a low-grade fever today but no other symptoms. No real improvement in the 1 02/26/2019 patient presents today for a follow-up visit here in our clinic he is last been seen in the clinic on October 29. Subsequently we were working on getting MRI to evaluate and see what exactly was going on and where we would need to go from the standpoint of whether or not he had osteomyelitis and again what treatments were going be required. Subsequently the patient ended up being admitted to the hospital on 02/07/2019 and was discharged on 02/14/2019. This is a somewhat interesting admission with a discharge diagnosis of pneumonia due to COVID-19 although he was positive for COVID-19 when tested at the urgent care but negative x2 when he was actually in the hospital. With that being said he did have acute respiratory failure with hypoxia and it was noted he also have a left foot ulceration with osteomyelitis. With  that being said he did require oxygen for his pneumonia and  I level 4 L. He was placed on antivirals and steroids for the COVID-19. He was also transferred to the [place] at one point. Nonetheless he did subsequently discharged home and since being home has done much better in that regard. The CT angiogram did not show any pulmonary embolism. With regard to the osteomyelitis the patient was placed on vancomycin and Zosyn while in the hospital but has been changed to Augmentin at discharge. It was also recommended that he follow- up with wound care and podiatry. Podiatry however wanted him to see [name] according to the patient prior to them doing anything further. His hemoglobin A1c was 9.9 as noted in the hospital. Have an MRI of the left foot performed while in the hospital on 02/04/2019. 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 There is an overlying plantar open wound noted an abscess tracking back along the lateral aspect of the fifth metatarsal shaft. There is otherwise diffuse cellulitis and mild fasciitis without findings of polymyositis. The patient did have recently pneumonia secondary to COVID-19 I looked in the chart through epic and it does appear that the patient may need to have an additional x-ray just to ensure everything is cleared and that he has no airspace disease prior to putting him into the [name]. 03/05/2019; patient was readmitted to the clinic last week. He was hospitalized twice for a viral upper respiratory tract infection from 11/1 through 11/4 and then 11/5 through 11/12 ultimately this turned out to be Covid pneumonitis. Although he was discharged on oxygen he is not using it. He says he feels fine. He has no exercise limitation no cough no sputum. His O2 sat in our clinic today was 100% on room air. He did manage to have his MRI which showed septic arthritis at the  fifth MTP joint and osteomyelitis involving the fifth metatarsal head and proximal phalanx. He received Vanco and Zosyn in the hospital and then was discharged on 2 weeks of Augmentin. I do not see any relevant cultures. He was supposed to follow-up with infectious disease but I do not see that he has an appointment. 12/8; patient saw Dr. Novella Olive of infectious disease last week. He felt that he had had adequate antibiotic therapy. He did not go to follow-up with Dr. Amalia Hailey of podiatry and I have again talked to him about the pros and cons of this. He does not want to consider a ray amputation of this time. He is aware of the risks of recurrence, migration etc. He started HBO today and tolerated this well. He can complete the Augmentin that I gave him last week. I have looked over the lab work that Dr. Chana Bode ordered his C-reactive protein was 3.3 and his sedimentation rate was 17. The C-reactive protein is never really been measurably that high in this patient 12/15; not much change in the wound today however he has undermining along the lateral part of the foot again more extensively than last week. He has some rims of epithelialization. We have been using silver alginate. He is undergoing hyperbarics but did not dive today 12/18; in for his obligatory first total contact cast change. Unfortunately there was pus coming from the undermining area around his fifth metatarsal head. This was cultured but will preclude reapplication of a cast. He is seen in conjunction with HBO 12/24; patient had staph lugdunensis in the wound in the undermining area laterally last time.  We put him on doxycycline which should have covered this. The wound looks better today. I am going to give him another week of doxycycline before reattempting the total contact cast 12/31; the patient is completing antibiotics. Hemorrhagic debris in the distal part of the wound with some undermining distally. He also had hyper  granulation. Extensive debridement with a #5 curette. The infected area that was on the lateral part of the fifth met head is closed over. I do not think he needs any more antibiotics. Patient was seen prior to HBO. Preparations for a total contact cast were made in the cast will be placed post hyperbarics 04/11/19; once again the patient arrives today without complaint. He had been in a cast all week noted that he had heavy drainage this week. This resulted in large raised areas of macerated tissue around the wound 1/14; wound bed looks better slightly smaller. Hydrofera Blue has been changing himself. He had a heavy drainage last week which caused a lot of maceration around the wound so I took him out of a total contact cast he says the drainage is actually better this week He is seen today in conjunction with HBO 1/21; returns to clinic. He was up in [place] for a day or 2 attending a funeral. He comes back in with the wound larger and with a large area of exposed bone. He had osteomyelitis and septic arthritis of the fifth left metatarsal head while he was in hospital. He received IV antibiotics in the hospital for a prolonged period of time then 3 weeks of Augmentin. Subsequently I gave him 2 weeks of doxycycline for more superficial wound infection. When I saw this last week the wound was smaller the surface of the wound looks satisfactory. 1/28; patient missed hyperbarics today. Bone biopsy I did last time showed Enterococcus faecalis and Staphylococcus lugdunensis . He has a wide area of exposed bone. We are going to use silver alginate as of today. I had another ethical discussion with the patient. 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 In this situation I think the likelihood of healing this is low. Therefore I have recommended a ray amputation and with the patient's agreement I have referred him to Dr. Doran Durand. The other issue is that his compliance with  hyperbarics has been minimal because of his work schedule and given his underlying decision I am going to stop this today READMISSION 10/24/2019 MRI 09/29/2019 left foot IMPRESSION: 1. Apparent skin ulceration inferior and lateral to the 5th metatarsal base with underlying heterogeneous T2 signal and enhancement in the subcutaneous fat. Small peripherally enhancing fluid collections along the plantar and lateral aspects of the 5th metatarsal base suspicious for abscesses. 2. Interval amputation through the mid 5th metatarsal with nonspecific low-level marrow edema and enhancement. Given the proximity to the adjacent soft tissue inflammatory changes, osteomyelitis cannot be excluded. 3. The additional bones appear unremarkable. MRI 09/29/2019 right foot IMPRESSION: 1. Soft tissue ulceration lateral to the 5th MTP joint. There is low-level T2 hyperintensity within the 4th and 5th metatarsal heads and adjacent proximal phalanges without abnormal T1 signal or cortical destruction. These findings are nonspecific and could be seen with early marrow edema, hyperemia or early osteomyelitis. No evidence of septic joint. 2. Mild tenosynovitis and synovial enhancement associated with the extensor digitorum tendons at the level of the midfoot. 3. Diffuse low-level muscular T2 hyperintensity and enhancement, most consistent with diabetic myopathy. LEFT FOOT BONE Methicillin resistant staphylococcus aureus Staphylococcus lugdunensis  MIC MIC CIPROFLOXACIN >=8 RESISTANT Resistant <=0.5 SENSI... Sensitive CLINDAMYCIN <=0.25 SENS... Sensitive >=8 RESISTANT Resistant ERYTHROMYCIN >=8 RESISTANT Resistant >=8 RESISTANT Resistant GENTAMICIN <=0.5 SENSI... Sensitive <=0.5 SENSI... Sensitive Inducible Clindamycin NEGATIVE Sensitive NEGATIVE Sensitive OXACILLIN >=4 RESISTANT Resistant 2 SENSITIVE Sensitive RIFAMPIN <=0.5 SENSI... Sensitive <=0.5 SENSI... Sensitive TETRACYCLINE <=1 SENSITIVE Sensitive <=1  SENSITIVE Sensitive TRIMETH/SULFA <=10 SENSIT Sensitive <=10 SENSIT Sensitive ... Marland Kitchen.. VANCOMYCIN 1 SENSITIVE Sensitive <=0.5 SENSI... Sensitive Right foot bone . Component 3 wk ago Specimen Description BONE Special Requests RIGHT 4 METATARSAL SAMPLE B Gram Stain NO WBC SEEN NO ORGANISMS SEEN Culture RARE METHICILLIN RESISTANT STAPHYLOCOCCUS AUREUS NO ANAEROBES ISOLATED Performed at [facility], [place] [address] [place], [place] [address] Report Status 10/08/2019 FINAL Organism ID, Bacteria METHICILLIN RESISTANT STAPHYLOCOCCUS AUREUS Resulting Agency [facility] Susceptibility Methicillin resistant staphylococcus aureus MIC CIPROFLOXACIN >=8 RESISTANT Resistant CLINDAMYCIN <=0.25 SENS... Sensitive ERYTHROMYCIN >=8 RESISTANT Resistant GENTAMICIN <=0.5 SENSI... Sensitive Inducible Clindamycin NEGATIVE Sensitive OXACILLIN >=4 RESISTANT Resistant RIFAMPIN <=0.5 SENSI... Sensitive TETRACYCLINE <=1 SENSITIVE Sensitive TRIMETH/SULFA <=10 SENSIT Sensitive ... VANCOMYCIN 1 SENSITIVE Sensitive This is a patient we had in clinic earlier this year with a wound over his left fifth metatarsal head. 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 Doran Durand on 05/16/2019. He seems to have developed problems with wounds on his bilateral feet in June 2021 although he may have had problems earlier than this. He was in an urgent care with a right foot ulcer on 09/26/2019 and given a course of doxycycline. This was apparently after having trouble getting into see orthopedics. He was seen by podiatry on 09/28/2019 noted to have bilateral lower extremity ulcers including the left lateral fifth metatarsal base and the right  subfifth met head. It was noted that had purulent drainage at that time. He required hospitalization from 6/20 through 7/2. This was because of worsening right foot wounds. He underwent bilateral operative incision and drainage and bone biopsies bilaterally. Culture results are listed above. He has been referred back to clinic by Dr. Jacqualyn Posey of podiatry. He is also followed by Dr. Megan Salon who saw him yesterday. He was discharged from hospital on Zyvox Flagyl and Levaquin and yesterday changed to doxycycline Flagyl and Levaquin. His inflammatory markers on 6/26 showed a sedimentation rate of 129 and a C-reactive protein of 5. This is improved to 14 and 1.3 respectively. This would indicate improvement. ABIs in our clinic today were 1.23 on the right and 1.20 on the left 11/01/2019 on evaluation today patient appears to be doing fairly well in regard to the wounds on his feet at this point. Fortunately there is no signs of active infection at this time. No fevers, chills, nausea, vomiting, or diarrhea. He currently is seeing infectious disease and still under their care at this point. Subsequently he also has both wounds which she has not been using collagen on as he did not receive that in his packaging he did not call us and let us know that. Apparently that just was missed on the order. Nonetheless we will get that straightened out today. 8/9-Patient returns for bilateral foot wounds, using Prisma with hydrogel moistened dressings, and the wounds appear stable. Patient using surgical shoes, avoiding much pressure or weightbearing as much as possible 8/16;  patient has bilateral foot wounds. 1 on the right lateral foot proximally the other is on the left mid lateral foot. Both required debridement of callus and thick skin around the wounds. We have been using silver collagen 8/27; patient has bilateral lateral foot wounds. The area on the left substantially surrounded by callus and dry skin. This was  removed from the wound edge. The underlying wound is small. The area on the right measured somewhat smaller today. 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 Unfortunately I did not update his antibiotics during today's visit. 9/10 I reviewed Dr. Hale Bogus last notes he felt he had completed antibiotics his inflammatory markers were reasonably well controlled. He has a small wound on the lateral left foot and a tiny area on the right which is just above closed. He is using Hydrofera Blue with border foam he has bilateral surgical shoes 9/24; 2 week f/u. doing well. right foot is closed. left foot still undermined. 10/14; right foot remains closed at the fifth met head. The area over the base of the left fifth metatarsal has a small open area but considerable undermining towards the plantar foot. Thick callus skin around this suggests an adequate pressure relief. We have talked about this. He says he is going to go back into his cam boot. I suggested a total contact cast he did not seem enamored with this suggestion 10/26; left foot base of the fifth metatarsal. Same condition as last time. He has skin over the area with an open wound however the skin is not adherent. He went to see Dr. Earleen Newport who did an x-ray and culture of his foot I have not reviewed the x-ray but the patient was not told anything. He is on doxycycline 11/11; since the patient was last here he was in the emergency room on 10/30 he was concerned about swelling in the left foot. They did not do any cultures or x-rays. They changed his antibiotics to cephalexin. Previous culture showed group B strep. The cephalexin is appropriate as doxycycline has less than predictable coverage. Arrives in clinic today with swelling over this area under the wound. He also has a new wound on the right fifth metatarsal head 11/18; the patient has a difficult wound on the lateral aspect  of the left fifth metatarsal head. The wound was almost ballotable last week I opened it slightly expecting to see purulence however there was just bleeding. I cultured this this was negative. X-ray unchanged. We are trying to get an MRI but I am not sure were going to be able to get this through his insurance. He also has an area on the right lateral fifth metatarsal head this looks healthier 12/3; the patient finally got our MRI. Surprisingly this did not show osteomyelitis. I did show the soft tissue ulceration at the lateral plantar aspect of the fifth metatarsal base with a tiny residual 6 mm abscess overlying the superficial fascia I have tried to culture this area I have not been able to get this to grow anything. Nevertheless the protruding tissue looks aggravated. I suspect we should try to treat the underlying "abscess with broad-spectrum antibiotics. I am going to start him on Levaquin and Flagyl. He has much less edema in his legs and I am going to continue to wrap his legs and see him weekly 12/10. I started Levaquin and Flagyl on him last week. He just picked up the Flagyl apparently there  was some delay. The worry is the wound on the left fifth metatarsal base which is substantial and worsening. His foot looks like he inverts at the ankle making this a weightbearing surface. Certainly no improvement in fact I think the measurements of this are somewhat worse. We have been using 12/17; he apparently just got the Levaquin yesterday this is 2 weeks after the fact. He has completed the Flagyl. The area over the left fifth metatarsal base still has protruding granulation tissue although it does not look quite as bad as it did some weeks ago. He has severe bilateral lymphedema although we have not been treating him for wounds on his legs this is definitely going to require compression. There was so much edema in the left I did not wish to put him in a total contact cast today. I am going to  increase his compression from 3-4 layer. The area on the right lateral fifth met head actually look quite good and superficial. 12/23; patient arrived with callus on the right fifth met head and the substantial hyper granulated callused wound on the base of his fifth metatarsal. He says he is completing his Levaquin in 2 days but I do not think that adds up with what I gave him but I will have to double check this. We are using Hydrofera Blue on both areas. My plan is to put the left leg in a cast the week after New Year's 04/06/2020; patient's wounds about the same. Right lateral fifth metatarsal head and left lateral foot over the base of the fifth metatarsal. There is undermining on the left lateral foot which I removed before application of total contact cast continuing with Hydrofera Blue new. Patient tells me he was seen by endocrinology today lab work was done [Dr. Kerr]. Also wondering whether he was referred to cardiology. I went over some lab work from previously does not have chronic renal failure certainly not nephrotic range proteinuria he does have very poorly controlled diabetes but this is not his most updated lab work. Hemoglobin A1c has been over 11 1/10; the patient had a considerable amount of leakage towards mid part of his left foot with macerated skin however the wound surface looks better the area on the right lateral fifth met head is better as well. I am going to change the dressing on the left foot under the total contact cast to silver alginate, continue with Hydrofera Blue on the right. 1/20; patient was in the total contact cast for 10 days. Considerable amount of drainage although the skin around the wound does not look too bad on the left foot. The area on the right fifth metatarsal head is closed. Our nursing staff reports large amount of drainage out of the left lateral foot wound 1/25; continues with copious amounts of drainage described by our intake staff. PCR  culture I did last week showed E. coli and Enterococcus faecalis and low quantities. Multiple resistance genes documented including extended spectrum beta lactamase, MRSA, MRSE, quinolone, tetracycline. The wound is not quite as good this week as it was 5 days ago but about the same size 2/3; continues with copious amounts of malodorous drainage per our intake nurse. The PCR culture I did 2 weeks ago showed E. coli and low quantities of Enterococcus. There were multiple resistance genes detected. I put Neosporin on him last week although this does not seem to have helped. The wound is slightly deeper today. Offloading continues to be an issue here although  with the amount of drainage she has a total contact cast is just not going to work 2/10; moderate amount of drainage. Patient reports he cannot get his stocking on over the dressing. I told him we have to do that the nurse gave him suggestions on how to make this work. The wound is on the bottom and lateral part of his left foot. Is cultured predominantly grew low amounts of Enterococcus, E. coli and anaerobes. There were multiple resistance genes detected including extended spectrum beta lactamase, quinolone, tetracycline. I could not think of an easy oral combination to address this so for now I am going to do topical antibiotics provided by [facility] I think the main agents here are vancomycin and an aminoglycoside. We have to be able to give him access to the wounds to get the topical antibiotic on 2/17; moderate amount of drainage this is unchanged. He has his Keystone topical antibiotic against the deep tissue culture organisms. He has been using this and changing the dressing daily. Silver alginate on the wound surface. 2/24; using Keystone antibiotic with silver alginate on the top. He had too much drainage for a total contact cast at one point although I think that is improving and I think in the next week or 2 it might be possible to replace  a total contact cast I did not do this today. In general the wound surface looks healthy however he continues to have thick rims of skin and subcutaneous tissue around the wide area of the circumference which I debrided 06/04/2020 upon evaluation today patient appears to be doing well in regard to his wound. I do feel like he is showing signs of improvement. There is little bit of callus and dead tissue around the edges of the wound as well as what appears to be a little bit of a sinus tract that is off to the side laterally I would perform debridement to clear that away today. 3/17; left lateral foot. The wound looks about the same as I remember. Not much depth surface looks healthy. No evidence of infection 3/25; left lateral foot. Wound surface looks about the same. Separating epithelium from the circumference. There really is no evidence of infection here however not making progress by my view 3/29; left lateral foot. Surface of the wound again looks reasonably healthy still thick skin and subcutaneous tissue around the wound margins. There is no evidence of infection. One of the concerns being brought up by the nurses has again the amount of drainage vis--vis continued use of a total contact cast 4/5; left lateral foot at roughly the base of the fifth metatarsal. Nice healthy looking granulated tissue with rims of epithelialization. The overall wound measurements are not any better but the tissue looks healthy. The only concern is the amount of drainage although he has no surrounding maceration with what we have been doing recently to absorb fluid and protect his skin. He also has lymphedema. 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 He states that  the kerlix/coban under the TCC rolled down and could not get it back up. He has been using an offloading scooter and has somehow hurt his right foot using this device. This happened last week. He states that  the side of his right foot developed a blister and opened. The top of his foot also has a few small open wounds he thinks is due to his socks rubbing in his shoes. He has not been using any dressings to the wound. He denies purulent drainage, fever/chills or erythema to the wounds. 4/22; patient presents for 1 week follow-up. He developed new wounds to the right foot that were evaluated at last clinic visit. He continues to have a total contact cast to the left leg and he reports no issues. He has been using silver collagen to the right foot wounds with no issues. He denies purulent drainage, fever/chills or erythema to the right foot wounds. He has no complaints today 4/25; patient presents for 1 week follow-up. He has a total contact cast of the left leg and reports no issues. He has been using silver alginate to the right foot wound. He denies purulent drainage, fever/chills or erythema to the right foot wounds. 5/2 patient presents for 1 week follow-up. T contact cast on the left. The wound which is on the base of the plantar foot at the base of the fifth metatarsal otal actually looks quite good and dimensions continue to gradually contract. HOWEVER the area on the right lateral fifth metatarsal head is much larger than what I remember from 2 weeks ago. Once more is he has significant levels of hypergranulation. Noteworthy that he had this same hyper granulated response on his wound on the left foot at one point in time. So much so that he I thought there was an underlying fluid collection. Based on this I think this just needs debridement. 5/9; the wound on the left actually continues to be gradually smaller with a healthy surface. Slight amount of drainage and maceration of the skin around  but not too bad. However he has a large wound over the right fifth metatarsal head very much in the same configuration as his left foot wound was initially. I used silver nitrate to address the hyper granulated tissue no mechanical debridement 5/16; area on the left foot did not look as healthy this week deeper thick surrounding macerated skin and subcutaneous tissue. oo The area on the right foot fifth met head was about the same oo The area on the right ankle that we identified last week is completely broken down into an open wound presumably a stocking rubbing issue 5/23; patient has been using a total contact cast to the left side. He has been using silver alginate underneath. He has also been using silver alginate to the right foot wounds. He has no complaints today. He denies any signs of infection. 5/31; the left-sided wound looks some better measure smaller surface granulation looks better. We have been using silver alginate under the total contact cast oo The large area on his right fifth met head and right dorsal foot look about the same still using silver alginate 6/6; neither side is good as I was hoping although the surface area dimensions are better. A lot of maceration on his left and right foot around the wound edge. Area on the dorsal right foot looks better. He says he was traveling. I am not sure what does the amount of maceration around the plantar wounds may be drainage issues 6/13; in general the wound surfaces look quite good on both sides. Macerated skin and raised edges around the wound required debridement although in general especially on the left the surface area seems improved. oo The area on  the right dorsal ankle is about the same I thought this would not be such a problem to close 6/20; not much change in either wound although the one on the right looks a little better. Both wounds have thick macerated edges to the skin requiring debridements. We have been using  silver alginate. The area on the dorsal right ankle is still open I thought this would be closed. 6/28; patient comes in today with a marked deterioration in the right foot wound fifth met head. Wide area of exposed bone this is a drastic change from last time. The area on the left there we have been casting is stagnant. We have been using silver alginate in both wound areas. 7/5; bone culture I did for PCR last time was positive for Pseudomonas, group B strep, Enterococcus and Staph aureus. There was no suggestion of methicillin resistance or ampicillin resistant genes. This was resistant to tetracycline however He comes into the clinic today with the area over his right plantar fifth metatarsal head which had been doing so well 2 weeks ago completely necrotic feeling bone. I do not know that this is going to be salvageable. The left foot wound is certainly no smaller but it has a better surface and is superficial. 7/8; patient called in this morning to say that his total contact cast was rubbing against his foot. He states he is doing fine overall. He denies signs of infection. 7/12; continued deterioration in the wound over the right fifth metatarsal head crumbling bone. This is not going to be salvageable. The patient agrees and wants to be referred to Dr. Doran Durand which we will attempt to arrange as soon as possible. I am going to continue him on antibiotics as long as that takes so I will renew those today. The area on the left foot which is the base of the fifth metatarsal continues to look somewhat better. Healthy looking tissue no depth no debridement is necessary here. 7/20; the patient was kindly seen by Dr. Doran Durand of orthopedics on 10/19/2020. 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 Towards this end we have taken him out of the total contact cast on the left we will put him in a wrap with  Hydrofera Blue. As I understand things surgery is planned for 7/21 7/27; patient had his surgery last Thursday. 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 Apparently everything went well we did not still disturb that today The area on the left foot actually looks quite good. He has been much less mobile which probably explains this he did not seem to do well in the total contact cast secondary to drainage and maceration I think. 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 Fortunately there is no evidence of active infection at this time. No fevers, chills, nausea, vomiting, or diarrhea. Overall I think that he is actually doing extremely well. Nonetheless I do believe that he is staying off of this more following the surgery in his right foot that is the reason the left is doing so great. 8/16; left plantar foot wound. This looks smaller than the last time I saw this he is using Hydrofera Blue. The surgical wound on the right foot is being followed by Dr. Doran Durand we did not look at this  today. He has surgical shoes on both feet 8/23; left plantar foot wound not as good this week. Surrounding macerated skin and subcutaneous tissue everything looks moist and wet. I do not think he is offloading this adequately. He is using a surgical shoe Apparently the right foot surgical wound is not open although I did not check his foot 8/31; left plantar foot lateral aspect. Much improved this week. He has no maceration. Some improvement in the surface area of the wound but most impressively the depth is come in we are using silver alginate. The patient is a Product/process development scientist. He is asked that we write him a letter so he can go back to work. I have also tried to see if we can write something that will allow him to limit the amount of time that he is on his foot at work. Right now he tells me his  classrooms are next door to each other however he has to supervise lunch which is well across. Hopefully the latter can be avoided 9/6; I believe the patient missed an appointment last week. He arrives in today with a wound looking roughly the same certainly no better. Undermining laterally and also inferiorly. We used molecuLight today in training with the patient's permission.. We are using silver alginate 9/21 wound is measuring bigger this week although this may have to do with the aggressive circumferential debridement last week in response to the blush fluorescence on the MolecuLight. Culture I did last week showed significant MSSA and E. coli. I put him on Augmentin but he has not started it yet. We are also going to send this for compounded antibiotics at [facility]. There is no evidence of systemic infection 9/29; silver alginate. His Keystone arrived. He is completing Augmentin in 2 days. Offloading in a cam boot. Moderate drainage per our intake staff 10/5; using silver alginate. He has been using his [place]. He has completed his Augmentin. Per our intake nurse still a lot of drainage, far too much to consider a total contact cast. Wound measures about the same. He had the same undermining area that I defined last week from a roughly 11-3. I remove this today 10/12; using silver alginate he is using the [place]. He comes in for a nurse visit hence we are applying [employer] twice a week. Measuring slightly better today and less notable drainage. Extensive debridement of the wound edge last time 10/18; using topical Keystone and silver alginate and a soft cast. Wound measurements about the same. Drainage was through his soft cast. We are changing this twice a week Tuesdays and Friday 10/25; comes in with moderate drainage. Still using Keystone silver alginate and a soft cast. Wound dimensions completely the same.He has a lot of edema in the left leg he has lymphedema. Asking for [name] to  consider wrapping him as he cannot get his stocking on over the soft cast 11/2; comes in with moderate to large drainage slightly smaller in terms of width we have been using [place]. His wound looks satisfactory but not much improvement 11/4; patient presents today for obligatory cast change. Has no issues or complaints today. He denies signs of infection. 11/9; patient traveled this weekend to [place], was on the cast quite a bit. Staining of the cast with black material from his walking boot. Drainage was not quite as bad as we feared. Using silver alginate and Keystone 11/16; we do not have size for cast therefore we have been putting a soft cast on him  since the change on Friday. Still a significant amount of drainage necessitating changing twice a week. We have been using the Keystone at cast changes either hard or soft as well as silver alginate Comes in the clinic with things actually looking fairly good improvement in width. He says his offloading is about the same 02/24/2021 upon evaluation today patient actually comes back in and is doing excellent in regard to his foot ulcer this is significantly smaller even compared to the last visit. The soft cast seems to have done extremely well for him which is great news. I do not see any signs of infection minimal debridement will be needed today. 11/30; left lateral foot much improved half a centimeter improvement in surface area. No evidence of infection. He seems to be doing better with the soft cast in the TCC therefore we will continue with this. He comes back in later in the week for a change with the nurses. This is due to drainage 12/6; no improvement in dimensions. Under illumination some debris on the surface we have been using silver alginate, soft cast. If there is anything optimistic here he seems to have have less drainage 12/13. Dimensions are improved both length and width and slightly in depth. Appears to be quite healthy today.  Raised edges of this thick skin and callus around the edges however. He is in a soft cast were bringing him back once for a change on Friday. Drainage is better 12/20. Dimensions are improved. He still has raised edges of thick skin and callus around the edges. We are using a soft cast 12/28; comes in today with thick callus around the wound. Using silver under alginate under a soft cast. I do not think there is much improvement in any measurement 2023 04/06/2021; patient was put in a total contact cast. Unfortunately not much change in surface area 1/10; not much different still thick callus and skin around the edge in spite of the total contact cast. This was just debrided last week we have been using the [facility] compounded antibiotic and silver alginate under a total contact cast 1/18 the patient's wound on the left side is doing nicely. smaller HOWEVER he comes in today with a wound on the right foot laterally. blister most likely serosangquenous drainage 1/24; the patient continues to do well in terms of the plantar left foot which is continued to contract using silver alginate under the total contact cast HOWEVER the right lateral foot is bigger with denuded skin around the edges. I used pickups and a #15 scalpel to remove this this looks like the remanence of a large blister. Cannot rule out infection. Culture in this area I did last week showed Staphylococcus lugdunensis few colonies. I am going to try to address this with his [employer] antibiotic that is done so well on the left having linezolid and this should cover the staph 2/1; the patient's wound on his left foot which was the original plantar foot wound thick skin and eschar around the edges even in the total contact cast but the wound surface does not look too bad The real problem is on how his right lateral foot at roughly the base of the fifth metatarsal. The wound is completely necrotic more worrisome than that there is swelling  around the edges of this. We have been using silver alginate on both wounds and Keystone on the right foot. Unfortunately I think he is going to require systemic antibiotics while we await cultures. He did not  get the x-ray done that we ordered last week [lost the prescription] Objective Constitutional Patient is hypertensive.. Pulse regular and within target range for patient.Marland Kitchen Respirations regular, non-labored and within target range.. Temperature is normal and within the target range for the patient.Marland Kitchen Appears in no distress. Vitals Time Taken: 8:25 AM, Height: 77 in, Weight: 280 lbs, BMI: 33.2, Temperature: 98.5 F, Pulse: 77 bpm, Respiratory Rate: 17 breaths/min, Blood Pressure: 147/92 mmHg, Capillary Blood Glucose: 78 mg/dl. General Notes: Wound exam oo His left foot looks about the same as last week however he has thick skin eschar around the edges of this I remove this with a #3 curette managing to get into subcutaneous tissue around the wound circumference. Hemostasis with direct pressure and a pressure dressing oo The real issue is on the right lateral foot. Substantial wound completely necrotic surface. I am concerned about the swelling and discoloration around the wound. I used a #5 curette to debride the surface. Discovered a venous bleeder that required silver nitrate Gelfoam and a pressure dressing. Post debridement I did a swab culture for CandS. Integumentary (Hair, Skin) Wound #3 status is Open. Original cause of wound was Trauma. The date acquired was: 10/02/2019. The wound has been in treatment 79 weeks. The wound is located on the Left,Lateral Foot. The wound measures 0.5cm length x 0.7cm width x 0.4cm depth; 0.275cm^2 area and 0.11cm^3 volume. There is Fat Layer (Subcutaneous Tissue) exposed. There is no tunneling or undermining noted. There is a medium amount of serosanguineous drainage noted. The wound margin is thickened. There is large (67-100%) red, pink granulation  within the wound bed. There is no necrotic tissue within the wound bed. Wound #8 status is Open. Original cause of wound was Blister. The date acquired was: 04/18/2021. The wound has been in treatment 2 weeks. The wound is located on the Right,Lateral Foot. The wound measures 3.1cm length x 2.7cm width x 0.2cm depth; 6.574cm^2 area and 1.315cm^3 volume. There is Fat Layer (Subcutaneous Tissue) exposed. There is no tunneling noted, however, there is undermining starting at :00 and ending at :00. There is a medium amount of purulent drainage noted. The wound margin is distinct with the outline attached to the wound base. There is large (67-100%) pink granulation within the wound bed. There is a small (1-33%) amount of necrotic tissue within the wound bed including Adherent Slough. 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 There was a Excisional Skin/Subcutaneous Tissue Debridement with a total area of 0.35 sq cm performed by [name], MD. With the following instrument(s): Curette to remove Viable and Non-Viable tissue/material. Material removed includes Callus, Subcutaneous Tissue, Skin: Dermis, and Skin: Epidermis after achieving pain control using Lidocaine. No specimens were taken. A time out was conducted at 08:50, prior to the start of the procedure. A Minimum amount of bleeding was controlled with Pressure. The procedure was tolerated well with a pain level of 0 throughout and a pain level of 0 following the procedure. Post Debridement Measurements: 0.5cm length x 0.7cm width x 0.4cm depth;  0.11cm^3 volume. Character of Wound/Ulcer Post Debridement is improved. Severity of Tissue Post Debridement is: Fat layer exposed.  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 There was a Excisional Skin/Subcutaneous Tissue Debridement with a total area of 8.37 sq cm performed by [name], MD. With the following instrument(s): Curette to remove Viable and Non-Viable tissue/material. Material removed includes Callus, Subcutaneous Tissue, Slough, Skin: Dermis, and Skin: Epidermis after achieving pain control using Lidocaine. 1 specimen was taken by a Tissue Culture and sent to the lab per facility protocol. A time out was conducted at 08:50, prior to the start of the procedure. A Minimum amount of bleeding was controlled with Pressure. The procedure was tolerated well with a pain level of 0 throughout and a pain level of 0 following the procedure. Post Debridement Measurements: 3.1cm length x 2.7cm width x 0.2cm depth; 1.315cm^3 volume. Character of Wound/Ulcer Post Debridement is improved. Severity of Tissue Post Debridement is: Fat layer exposed. Post procedure Diagnosis Wound #8: Same as Pre-Procedure Plan Follow-up Appointments: Return Appointment in 1 week. - Dr. Dellia Nims Bathing/ Shower/ Hygiene: May shower with protection but do not get wound dressing(s) wet. - Use Cast Protector Bag Edema Control - Lymphedema / SCD / Other: Elevate legs to the level of the heart or above for 30 minutes daily and/or when sitting, a frequency of: - throughout the day Avoid standing for long periods of time. Exercise regularly Moisturize legs daily. - right leg every night before bed. Compression stocking or Garment 20-30 mm/Hg pressure to: - Apply to right leg in the morning and remove at  night. Off-Loading: T Contact Cast to Left Lower Extremity otal Open toe surgical shoe to: - right foot Other: - minimal weight bearing left foot Additional Orders / Instructions: Follow Nutritious Diet Laboratory ordered were: Anerobic culture - right foot WOUND #3: - Foot Wound Laterality: Left, Lateral Cleanser: Soap and Water 1 x Per Week/7 Days Discharge Instructions: May shower and wash wound with dial antibacterial soap and water prior to dressing change. Topical: keystone 1 x Per Week/7 Days Prim Dressing: KerraCel Ag Gelling Fiber Dressing, 4x5 in (silver alginate) 1 x Per Week/7 Days ary Discharge Instructions: Apply silver alginate to wound bed as instructed Secondary Dressing: Woven Gauze Sponge, Non-Sterile 4x4 in 1 x Per Week/7 Days Discharge Instructions: Apply over primary dressing as directed. Secondary Dressing: Optifoam Non-Adhesive Dressing, 4x4 in 1 x Per Week/7 Days Discharge Instructions: Apply over primary dressing as directed. Com pression Wrap: Kerlix Roll 4.5x3.1 (in/yd) 1 x Per Week/7 Days Discharge Instructions: Apply Kerlix and Coban compression as directed. Com pression Wrap: Coban Self-Adherent Wrap 4x5 (in/yd) 1 x Per Week/7 Days Discharge Instructions: Apply over Kerlix as directed. WOUND #8: - Foot Wound Laterality: Right, Lateral Cleanser: Soap and Water 1 x Per Week/7 Days Discharge Instructions: May shower and wash wound with dial antibacterial soap and water prior to dressing change. Topical: keystone 1 x Per Week/7 Days Prim Dressing: KerraCel Ag Gelling Fiber Dressing, 4x5 in (silver alginate) 1 x Per Week/7 Days ary Discharge Instructions: Apply silver alginate to wound bed as instructed Secondary Dressing: Woven Gauze Sponge, Non-Sterile 4x4 in 1 x Per Week/7 Days Discharge Instructions: Apply over primary dressing as directed. Secondary Dressing: Optifoam Non-Adhesive Dressing, 4x4 in 1 x Per Week/7 Days Discharge Instructions: Apply  over primary dressing as directed. Secured With: [employer], 4.5x3.1 (in/yd) 1 x Per Week/7 Days Discharge Instructions: Secure  with Kerlix as directed. Secured With: 26M Medipore H Soft Cloth Surgical T ape, 4 x 10 (in/yd) 1 x Per Week/7 Days Discharge Instructions: Secure with tape as directed. 1. Extensive debridement on the right. He bleeds very easily but he is not on anticoagulants. Postdebridement swab culture. 2. I was not happy with the condition of the surrounding soft tissue started him on doxycycline 100 twice daily for 7 days he has a history of staff aureus 3. Area on the left very scaled thick skin and subcutaneous tissue around the margins removed to try and get but clean epithelializing edge. No evidence of infection here. 4. I emphasized the patient I need to see the x-ray of the right foot. He may very well require an MRI Electronic Signature(s) Signed: 05/05/2021 4:31:34 PM By: [name] MD Entered By: [name] on 05/05/2021 09:02:05 -------------------------------------------------------------------------------- Total Contact Cast Details Patient Name: Date of Service: [name], [name] 05/05/2021 8:15 A M Medical Record Number: [id number] Patient Account Number: [id number] Date of Birth/Sex: Treating RN: [birth year] (36 y.o. [name] Primary Care Provider: Seward [name] Other Clinician: Referring Provider: Treating Provider/Extender: [name] in Treatment: 89 T Contact Cast Applied for Wound Assessment: otal Wound #3 Left,Lateral Foot Performed By: Physician [name], MD Post Procedure Diagnosis Same as Pre-procedure Electronic Signature(s) Signed: 05/05/2021 4:31:34 PM By: [name] MD Signed: 05/05/2021 4:37:24 PM By: Rhae Hammock RN Entered By: Rhae Hammock on 05/05/2021 09:40:15 -------------------------------------------------------------------------------- SuperBill  Details Patient Name: Date of Service: [name], [name] 05/05/2021 Medical Record Number: [id number] Patient Account Number: [id number] Date of Birth/Sex: Treating RN: 1985/05/29 (36 y.o. 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 foot with other specified severity Facility Procedures CPT4 Code: [id number] Description: 87867 - DEB SUBQ TISSUE 20 SQ CM/< ICD-10 Diagnosis Description L97.528 Non-pressure chronic ulcer of other part of left foot with other specified seve L97.518 Non-pressure chronic ulcer of other part of right foot with other specified sev Modifier: rity erity Quantity: 1 Physician Procedures : CPT4 Code Description Modifier [id number] 09628 - WC PHYS SUBQ TISS 20 SQ CM ICD-10 Diagnosis Description L97.528 Non-pressure chronic ulcer of other part of left foot with other specified severity L97.518 Non-pressure chronic ulcer of other part of right  foot with other specified severity Quantity: 1 Electronic Signature(s) Signed: 05/05/2021 4:31:34 PM By: [name] MD Entered By: [name] on 05/05/2021 36:62:94

## 2021-05-05 NOTE — Progress Notes (Signed)
[name], Max ([id number]) Visit Report for 05/05/2021 Arrival Information Details Patient Name: Date of Service: [name] D 05/05/2021 8:15 A M Medical Record Number: [id number] Patient Account Number: [id number] Date of Birth/Sex: Treating RN: [birth year] (36 y.o. [name], [name] Primary Care [place] Hedman: [name] Other Clinician: Referring Aaron Bostwick: Treating Derril Franek/Extender: [name] in Treatment: 60 Visit Information History Since Last Visit Added or deleted any medications: No Patient Arrived: Ambulatory Any new allergies or adverse reactions: No Arrival Time: 08:24 Had a fall or experienced change in No Accompanied By: self activities of daily living that may affect Transfer Assistance: None risk of falls: Patient Identification Verified: Yes Signs or symptoms of abuse/neglect since last visito No Secondary Verification Process Completed: Yes Hospitalized since last visit: No Patient Requires Transmission-Based Precautions: No Implantable device outside of the clinic excluding No Patient Has Alerts: No cellular tissue based products placed in the center since last visit: Has Dressing in Place as Prescribed: Yes Pain Present Now: No Electronic Signature(s) Signed: 05/05/2021 4:37:24 PM By: Rhae Hammock RN Entered By: Rhae Hammock on 05/05/2021 08:25:00 -------------------------------------------------------------------------------- Encounter Discharge Information Details Patient Name: Date of Service: [name], [name] 05/05/2021 8:15 A M Medical Record Number: [id number] Patient Account Number: [id number] Date of Birth/Sex: Treating RN: 14-Aug-1985 (36 y.o. [name], [name] Primary Care Winston Misner: [name] Other Clinician: Referring Soren Lazarz: Treating Jayten Gabbard/Extender: [name] in Treatment: 69 Encounter Discharge Information Items Post Procedure Vitals Discharge Condition:  Stable Temperature (F): 98.7 Ambulatory Status: Ambulatory Pulse (bpm): 74 Discharge Destination: Home Respiratory Rate (breaths/min): 17 Transportation: Private Auto Blood Pressure (mmHg): 134/74 Accompanied By: self Schedule Follow-up Appointment: Yes Clinical Summary of Care: Patient Declined Electronic Signature(s) Signed: 05/05/2021 4:37:24 PM By: Rhae Hammock RN Entered By: Rhae Hammock on 05/05/2021 09:43:09 -------------------------------------------------------------------------------- Lower Extremity Assessment Details Patient Name: Date of Service: [name], [name] 05/05/2021 8:15 A M Medical Record Number: [id number] Patient Account Number: [id number] Date of Birth/Sex: Treating RN: [birth year] (36 y.o. [name] Primary Care Skylarr Liz: [name] Other Clinician: Referring Lijah Bourque: Treating Kyanna Mahrt/Extender: [name] in Treatment: 79 Edema Assessment Assessed: [name]: No] [name]: Yes] Edema: [Left: Yes] [Right: Yes] Calf Left: Right: Point of Measurement: 48 cm From Medial Instep 52.5 cm 49.5 cm Ankle Left: Right: Point of Measurement: 11 cm From Medial Instep 28 cm 30 cm Vascular Assessment Pulses: Dorsalis Pedis Palpable: [Right:Yes] Posterior Tibial Doppler Audible: [Right:Yes] Electronic Signature(s) Signed: 05/05/2021 4:37:24 PM By: Rhae Hammock RN Entered By: Rhae Hammock on 05/05/2021 08:26:45 -------------------------------------------------------------------------------- Multi Wound Chart Details Patient Name: Date of Service: [name], [name] 05/05/2021 8:15 A M Medical Record Number: [id number] Patient Account Number: [id number] Date of Birth/Sex: Treating RN: 04-May-1985 (36 y.o. [name], [name] Primary Care Callia Swim: [name] Other Clinician: Referring Kearia Yin: Treating Deven Audi/Extender: [name] in Treatment: 79 Vital Signs Height(in):  77 Capillary Blood Glucose(mg/dl): 78 Weight(lbs): 280 Pulse(bpm): 61 Body Mass Index(BMI): 33.2 Blood Pressure(mmHg): 147/92 Temperature(F): 98.5 Respiratory Rate(breaths/min): 17 Photos: [3:Left, Lateral Foot] [8:Right, Lateral Foot] [N/A:N/A N/A] Wound Location: [3:Trauma] [8:Blister] [N/A:N/A] Wounding Event: [3:Diabetic Wound/Ulcer of the Lower] [8:Diabetic Wound/Ulcer of the Lower] [N/A:N/A] Primary Etiology: [3:Extremity Type II Diabetes] [8:Extremity Type II Diabetes] [N/A:N/A] Comorbid History: [3:10/02/2019] [8:04/18/2021] [N/A:N/A] Date Acquired: [3:79] [8:2] [N/A:N/A] Weeks of Treatment: [3:Open] [8:Open] [N/A:N/A] Wound Status: [3:No] [8:No] [N/A:N/A] Wound Recurrence: [3:0.5x0.7x0.4] [8:3.1x2.7x0.2] [N/A:N/A] Measurements L x W x D (cm) [3:0.275] [8:6.574] [N/A:N/A] A (cm) : rea [3:0.11] [  8:1.315] [N/A:N/A] Volume (cm) : [3:83.30%] [8:-1095.30%] [N/A:N/A] % Reduction in A rea: [3:33.30%] [8:-1095.50%] [N/A:N/A] % Reduction in Volume: Starting Position 1 (o'clock): Ending Position 1 (o'clock): [3:No] [8:Yes] [N/A:N/A] Undermining: [3:Grade 2] [8:Grade 2] [N/A:N/A] Classification: [3:Medium] [8:Medium] [N/A:N/A] Exudate A mount: [3:Serosanguineous] [8:Purulent] [N/A:N/A] Exudate Type: [3:red, brown] [8:yellow, brown, green] [N/A:N/A] Exudate Color: [3:Thickened] [8:Distinct, outline attached] [N/A:N/A] Wound Margin: [3:Large (67-100%)] [8:Large (67-100%)] [N/A:N/A] Granulation A mount: [3:Red, Pink] [8:Pink] [N/A:N/A] Granulation Quality: [3:None Present (0%)] [8:Small (1-33%)] [N/A:N/A] Necrotic A mount: [3:Fat Layer (Subcutaneous Tissue): Yes Fat Layer (Subcutaneous Tissue): Yes N/A] Exposed Structures: [3:Fascia: No Tendon: No Muscle: No Joint: No Bone: No Medium (34-66%)] [8:Fascia: No Tendon: No Muscle: No Joint: No Bone: No None] [N/A:N/A] Epithelialization: [3:Debridement - Excisional] [8:Debridement - Excisional] [N/A:N/A] Debridement: Pre-procedure  Verification/Time Out 08:50 [8:08:50] [N/A:N/A] Taken: [3:Lidocaine] [8:Lidocaine] [N/A:N/A] Pain Control: [3:Callus, Subcutaneous] [8:Callus, Subcutaneous, Slough] [N/A:N/A] Tissue Debrided: [3:Skin/Subcutaneous Tissue] [8:Skin/Subcutaneous Tissue] [N/A:N/A] Level: [3:0.35] [8:8.37] [N/A:N/A] Debridement A (sq cm): [3:rea Curette] [8:Curette] [N/A:N/A] Instrument: [3:Minimum] [8:Minimum] [N/A:N/A] Bleeding: [3:Pressure] [8:Pressure] [N/A:N/A] Hemostasis A chieved: [3:0] [8:0] [N/A:N/A] Procedural Pain: [3:0] [8:0] [N/A:N/A] Post Procedural Pain: [3:Procedure was tolerated well] [8:Procedure was tolerated well] [N/A:N/A] Debridement Treatment Response: [3:0.5x0.7x0.4] [8:3.1x2.7x0.2] [N/A:N/A] Post Debridement Measurements L x W x D (cm) [3:0.11] [8:1.315] [N/A:N/A] Post Debridement Volume: (cm) [3:Debridement] [8:N/A] [N/A:N/A] Treatment Notes Electronic Signature(s) Signed: 05/05/2021 4:31:34 PM By: [name] MD Signed: 05/05/2021 4:37:24 PM By: Rhae Hammock RN Entered By: [name] on 05/05/2021 08:56:11 -------------------------------------------------------------------------------- Multi-Disciplinary Care Plan Details Patient Name: Date of Service: [name], [name] 05/05/2021 8:15 A M Medical Record Number: [id number] Patient Account Number: [id number] Date of Birth/Sex: Treating RN: Sep 05, 1985 (36 y.o. [name], [name] Primary Care Breton Berns: [name] Other Clinician: Referring Melanye Hiraldo: Treating Mariamawit Depaoli/Extender: [name] in Treatment: 87 Multidisciplinary Care Plan reviewed with physician Active Inactive Nutrition Nursing Diagnoses: Imbalanced nutrition Potential for alteratiion in Nutrition/Potential for imbalanced nutrition Goals: Patient/caregiver agrees to and verbalizes understanding of need to use nutritional supplements and/or vitamins as prescribed Date Initiated: 10/24/2019 Date Inactivated: 04/06/2020 Target  Resolution Date: 04/03/2020 Goal Status: Met Patient/caregiver will maintain therapeutic glucose control Date Initiated: 10/24/2019 Target Resolution Date: 05/14/2021 Goal Status: Active Interventions: Assess HgA1c results as ordered upon admission and as needed Assess patient nutrition upon admission and as needed per policy Provide education on elevated blood sugars and impact on wound healing Provide education on nutrition Treatment Activities: Education provided on Nutrition : 11/24/2020 Notes: 11/17/20: Glucose control ongoing issue, target date extended. 01/26/21: Glucose management continues. Wound/Skin Impairment Nursing Diagnoses: Impaired tissue integrity Knowledge deficit related to ulceration/compromised skin integrity Goals: Patient/caregiver will verbalize understanding of skin care regimen Date Initiated: 10/24/2019 Target Resolution Date: 05/15/2021 Goal Status: Active Ulcer/skin breakdown will have a volume reduction of 30% by week 4 Date Initiated: 10/24/2019 Date Inactivated: 01/16/2020 Target Resolution Date: 01/10/2020 Unmet Reason: no change in Goal Status: Unmet measurements. Interventions: Assess patient/caregiver ability to obtain necessary supplies Assess patient/caregiver ability to perform ulcer/skin care regimen upon admission and as needed Assess ulceration(s) every visit Provide education on ulcer and skin care Notes: 11/17/20: Wound care regimen continues Electronic Signature(s) Signed: 05/05/2021 4:37:24 PM By: Rhae Hammock RN Entered By: Rhae Hammock on 05/05/2021 08:49:09 -------------------------------------------------------------------------------- Pain Assessment Details Patient Name: Date of Service: [name], [name] 05/05/2021 8:15 A M Medical Record Number: [id number] Patient Account Number: [id number] Date of Birth/Sex: Treating RN: Feb 21, 1986 (36 y.o. [name] Primary Care Aria Jarrard: [name] Other  Clinician:  Referring Ocia Simek: Treating Mileena Rothenberger/Extender: [name] in Treatment: 79 Active Problems Location of Pain Severity and Description of Pain Patient Has Paino No Patient Has Paino No Site Locations Pain Management and Medication Current Pain Management: Electronic Signature(s) Signed: 05/05/2021 4:37:24 PM By: Rhae Hammock RN Entered By: Rhae Hammock on 05/05/2021 08:25:49 -------------------------------------------------------------------------------- Patient/Caregiver Education Details Patient Name: Date of Service: [name], [name] 2/1/2023andnbsp8:15 [facility] Record Number: [id number] Patient Account Number: [id number] Date of Birth/Gender: Treating RN: 10-31-1985 (36 y.o. [name] Primary Care Physician: [name] Other Clinician: Referring Physician: Treating Physician/Extender: [name] in Treatment: 31 Education Assessment Education Provided To: Patient Education Topics Provided Elevated Blood Sugar/ Impact on Healing: Methods: Explain/Verbal Responses: Reinforcements needed, State content correctly Electronic Signature(s) Signed: 05/05/2021 4:37:24 PM By: Rhae Hammock RN Entered By: Rhae Hammock on 05/05/2021 08:49:26 -------------------------------------------------------------------------------- Wound Assessment Details Patient Name: Date of Service: [name], [name] 05/05/2021 8:15 A M Medical Record Number: [id number] Patient Account Number: [id number] Date of Birth/Sex: Treating RN: 07/18/1985 (36 y.o. [name], [name] Primary Care Jesus Poplin: [name] Other Clinician: Referring Deane Melick: Treating Martrell Eguia/Extender: [name] in Treatment: 79 Wound Status Wound Number: 3 Primary Etiology: Diabetic Wound/Ulcer of the Lower Extremity Wound Location: Left, Lateral Foot Wound Status: Open Wounding Event:  Trauma Comorbid History: Type II Diabetes Date Acquired: 10/02/2019 Weeks Of Treatment: 79 Clustered Wound: No Photos Wound Measurements Length: (cm) 0.5 Width: (cm) 0.7 Depth: (cm) 0.4 Area: (cm) 0.275 Volume: (cm) 0.11 % Reduction in Area: 83.3% % Reduction in Volume: 33.3% Epithelialization: Medium (34-66%) Tunneling: No Undermining: No Wound Description Classification: Grade 2 Wound Margin: Thickened Exudate Amount: Medium Exudate Type: Serosanguineous Exudate Color: red, brown Foul Odor After Cleansing: No Slough/Fibrino No Wound Bed Granulation Amount: Large (67-100%) Exposed Structure Granulation Quality: Red, Pink Fascia Exposed: No Necrotic Amount: None Present (0%) Fat Layer (Subcutaneous Tissue) Exposed: Yes Tendon Exposed: No Muscle Exposed: No Joint Exposed: No Bone Exposed: No Treatment Notes Wound #3 (Foot) Wound Laterality: Left, Lateral Cleanser Soap and Water Discharge Instruction: May shower and wash wound with dial antibacterial soap and water prior to dressing change. Peri-Wound Care Topical keystone Primary Dressing KerraCel Ag Gelling Fiber Dressing, 4x5 in (silver alginate) Discharge Instruction: Apply silver alginate to wound bed as instructed Secondary Dressing Woven Gauze Sponge, Non-Sterile 4x4 in Discharge Instruction: Apply over primary dressing as directed. Optifoam Non-Adhesive Dressing, 4x4 in Discharge Instruction: Apply over primary dressing as directed. Secured With Compression Wrap Kerlix Roll 4.5x3.1 (in/yd) Discharge Instruction: Apply Kerlix and Coban compression as directed. Coban Self-Adherent Wrap 4x5 (in/yd) Discharge Instruction: Apply over Kerlix as directed. Compression Stockings Add-Ons Electronic Signature(s) Signed: 05/05/2021 4:37:24 PM By: Rhae Hammock RN Entered By: Rhae Hammock on 05/05/2021 08:35:12 -------------------------------------------------------------------------------- Wound  Assessment Details Patient Name: Date of Service: [name], [name] 05/05/2021 8:15 A M Medical Record Number: [id number] Patient Account Number: [id number] Date of Birth/Sex: Treating RN: [birth year] (36 y.o. [name], [name] Primary Care Arabia Nylund: [name] Other Clinician: Referring Marynell Bies: Treating Kamy Poinsett/Extender: [name] in Treatment: 79 Wound Status Wound Number: 8 Primary Etiology: Diabetic Wound/Ulcer of the Lower Extremity Wound Location: Right, Lateral Foot Wound Status: Open Wounding Event: Blister Comorbid History: Type II Diabetes Date Acquired: 04/18/2021 Weeks Of Treatment: 2 Clustered Wound: No Photos Wound Measurements Length: (cm) 3.1 Width: (cm) 2.7 Depth: (cm) 0.2 Area: (cm) 6.574 Volume: (cm) 1.315 % Reduction in Area: -1095.3% % Reduction in  Volume: -1095.5% Epithelialization: None Tunneling: No Undermining: Yes Wound Description Classification: Grade 2 Wound Margin: Distinct, outline attached Exudate Amount: Medium Exudate Type: Purulent Exudate Color: yellow, brown, green Foul Odor After Cleansing: No Slough/Fibrino No Wound Bed Granulation Amount: Large (67-100%) Exposed Structure Granulation Quality: Pink Fascia Exposed: No Necrotic Amount: Small (1-33%) Fat Layer (Subcutaneous Tissue) Exposed: Yes Necrotic Quality: Adherent Slough Tendon Exposed: No Muscle Exposed: No Joint Exposed: No Bone Exposed: No Treatment Notes Wound #8 (Foot) Wound Laterality: Right, Lateral Cleanser Soap and Water Discharge Instruction: May shower and wash wound with dial antibacterial soap and water prior to dressing change. Peri-Wound Care Topical keystone Primary Dressing KerraCel Ag Gelling Fiber Dressing, 4x5 in (silver alginate) Discharge Instruction: Apply silver alginate to wound bed as instructed Secondary Dressing Woven Gauze Sponge, Non-Sterile 4x4 in Discharge Instruction: Apply over primary  dressing as directed. Optifoam Non-Adhesive Dressing, 4x4 in Discharge Instruction: Apply over primary dressing as directed. Secured With [employer], 4.5x3.1 (in/yd) Discharge Instruction: Secure with Kerlix as directed. 46M Medipore H Soft Cloth Surgical T ape, 4 x 10 (in/yd) Discharge Instruction: Secure with tape as directed. Compression Wrap Compression Stockings Add-Ons Electronic Signature(s) Signed: 05/05/2021 4:37:24 PM By: Rhae Hammock RN Entered By: Rhae Hammock on 05/05/2021 08:29:50 -------------------------------------------------------------------------------- Vitals Details Patient Name: Date of Service: [name], [name] 05/05/2021 8:15 A M Medical Record Number: [id number] Patient Account Number: [id number] Date of Birth/Sex: Treating RN: Feb 01, 1986 (36 y.o. [name], [name] Primary Care Merilee Wible: [name] Other Clinician: Referring Ikea Demicco: Treating Jamire Shabazz/Extender: [name] in Treatment: 79 Vital Signs Time Taken: 08:25 Temperature (F): 98.5 Height (in): 77 Pulse (bpm): 77 Weight (lbs): 280 Respiratory Rate (breaths/min): 17 Body Mass Index (BMI): 33.2 Blood Pressure (mmHg): 147/92 Capillary Blood Glucose (mg/dl): 78 Reference Range: 80 - 120 mg / dl Electronic Signature(s) Signed: 05/05/2021 4:37:24 PM By: Rhae Hammock RN Entered By: Rhae Hammock on 05/05/2021 08:25:41

## 2021-05-08 LAB — AEROBIC CULTURE W GRAM STAIN (SUPERFICIAL SPECIMEN)
Culture: NO GROWTH
Gram Stain: NONE SEEN

## 2021-05-09 ENCOUNTER — Other Ambulatory Visit: Payer: Self-pay

## 2021-05-09 ENCOUNTER — Ambulatory Visit (HOSPITAL_COMMUNITY)
Admission: RE | Admit: 2021-05-09 | Discharge: 2021-05-09 | Disposition: A | Discharge status: Home / Self Care | Payer: BC Managed Care – PPO | Source: Ambulatory Visit | Attending: Internal Medicine | Admitting: Internal Medicine

## 2021-05-09 ENCOUNTER — Other Ambulatory Visit (HOSPITAL_COMMUNITY): Payer: Self-pay | Admitting: Internal Medicine

## 2021-05-09 DIAGNOSIS — S81801A Unspecified open wound, right lower leg, initial encounter: Secondary | ICD-10-CM | POA: Insufficient documentation

## 2021-05-11 ENCOUNTER — Other Ambulatory Visit: Payer: Self-pay

## 2021-05-11 ENCOUNTER — Encounter (HOSPITAL_BASED_OUTPATIENT_CLINIC_OR_DEPARTMENT_OTHER): Payer: BC Managed Care – PPO | Admitting: Internal Medicine

## 2021-05-11 DIAGNOSIS — E11621 Type 2 diabetes mellitus with foot ulcer: Secondary | ICD-10-CM | POA: Diagnosis not present

## 2021-05-11 NOTE — Progress Notes (Signed)
[name], [name] ([id number]) Visit Report for 04/27/2021 Arrival Information Details Patient Name: Date of Service: [name] D 04/27/2021 7:30 A M Medical Record Number: [id number] Patient Account Number: [id number] Date of Birth/Sex: Treating RN: [birth year] (36 y.o. [name] Primary Care Sheralee Qazi: [name] Other Clinician: Referring Hampton Cost: Treating Kaedan Richert/Extender: [name] in Treatment: 55 Visit Information History Since Last Visit Added or deleted any medications: No Patient Arrived: Ambulatory Any new allergies or adverse reactions: No Arrival Time: 07:48 Had a fall or experienced change in No Accompanied By: self activities of daily living that may affect Transfer Assistance: None risk of falls: Patient Identification Verified: Yes Signs or symptoms of abuse/neglect since No Secondary Verification Process Completed: Yes last visito Patient Requires Transmission-Based Precautions: No Hospitalized since last visit: No Patient Has Alerts: No Implantable device outside of the clinic No excluding cellular tissue based products placed in the center since last visit: Has Dressing in Place as Prescribed: Yes Has Footwear/Offloading in Place as Yes Prescribed: Left: T Contact Cast otal Right: Surgical Shoe with Pressure Relief Insole Pain Present Now: No Electronic Signature(s) Signed: 05/11/2021 8:14:39 AM By: [name] RN, BSN Entered By: [name] on 04/27/2021 07:54:13 -------------------------------------------------------------------------------- Encounter Discharge Information Details Patient Name: Date of Service: [name], [name] 04/27/2021 7:30 A M Medical Record Number: [id number] Patient Account Number: [id number] Date of Birth/Sex: Treating RN: 08/16/1985 (36 y.o. [name] Primary Care Ethelyne Erich: [name] Other Clinician: Referring Caliegh Middlekauff: Treating Clatie Kessen/Extender: [name] in Treatment: 51 Encounter Discharge Information Items Post Procedure Vitals Discharge Condition: Stable Temperature (F): 98.7 Ambulatory Status: Ambulatory Pulse (bpm): 93 Discharge Destination: Home Respiratory Rate (breaths/min): 18 Transportation: Private Auto Blood Pressure (mmHg): 173/98 Accompanied By: alone Schedule Follow-up Appointment: Yes Clinical Summary of Care: Patient Declined Electronic Signature(s) Signed: 05/11/2021 8:14:39 AM By: [name] RN, BSN Entered By: [name] on 04/27/2021 15:49:46 -------------------------------------------------------------------------------- Lower Extremity Assessment Details Patient Name: Date of Service: [name], [name] 04/27/2021 7:30 A M Medical Record Number: [id number] Patient Account Number: [id number] Date of Birth/Sex: Treating RN: 1985-04-10 (36 y.o. [name] Primary Care Jazzmine Kleiman: [name] Other Clinician: Referring Twana Wileman: Treating Ulysses Alper/Extender: [name] in Treatment: 78 Edema Assessment Assessed: [name]: No] [name]: No] Edema: [Left: Yes] [Right: Yes] Calf Left: Right: Point of Measurement: 48 cm From Medial Instep 52.5 cm 49.5 cm Ankle Left: Right: Point of Measurement: 11 cm From Medial Instep 28 cm 30 cm Vascular Assessment Pulses: Dorsalis Pedis Palpable: [Left:Yes] [Right:Yes] Electronic Signature(s) Signed: 05/11/2021 8:14:39 AM By: [name] RN, BSN Entered By: [name] on 04/27/2021 08:04:30 -------------------------------------------------------------------------------- Multi Wound Chart Details Patient Name: Date of Service: [name], [name] 04/27/2021 7:30 A M Medical Record Number: [id number] Patient Account Number: [id number] Date of Birth/Sex: Treating RN: [birth year] (36 y.o. [name] Primary Care Boris Engelmann: [name] Other Clinician: Referring Marco Raper: Treating  Hoover Grewe/Extender: [name] in Treatment: 78 Vital Signs Height(in): 77 Capillary Blood Glucose(mg/dl): 147 Weight(lbs): 280 Pulse(bpm): 57 Body Mass Index(BMI): 33.2 Blood Pressure(mmHg): 173/98 Temperature(F): 98.7 Respiratory Rate(breaths/min): 18 Photos: [3:Left, Lateral Foot] [8:Right, Lateral Foot] [N/A:N/A N/A] Wound Location: [3:Trauma] [8:Blister] [N/A:N/A] Wounding Event: [3:Diabetic Wound/Ulcer of the Lower] [8:Diabetic Wound/Ulcer of the Lower] [N/A:N/A] Primary Etiology: [3:Extremity Type II Diabetes] [8:Extremity Type II Diabetes] [N/A:N/A] Comorbid History: [3:10/02/2019] [8:04/18/2021] [N/A:N/A] Date Acquired: [3:78] [8:0] [N/A:N/A] Weeks of Treatment: [3:Open] [8:Open] [N/A:N/A] Wound Status: [3:No] [8:No] [N/A:N/A] Wound Recurrence: [  3:0.5x0.6x0.2] [8:2x1.5x0.1] [N/A:N/A] Measurements L x W x D (cm) [3:0.236] [8:2.356] [N/A:N/A] A (cm) : rea [3:0.047] [8:0.236] [N/A:N/A] Volume (cm) : [3:85.70%] [8:-328.40%] [N/A:N/A] % Reduction in A [3:rea: 71.50%] [8:-114.50%] [N/A:N/A] % Reduction in Volume: [3:Grade 2] [8:Grade 2] [N/A:N/A] Classification: [3:Medium] [8:Medium] [N/A:N/A] Exudate A mount: [3:Serosanguineous] [8:Purulent] [N/A:N/A] Exudate Type: [3:red, brown] [8:yellow, brown, green] [N/A:N/A] Exudate Color: [3:Thickened] [8:Distinct, outline attached] [N/A:N/A] Wound Margin: [3:Large (67-100%)] [8:Large (67-100%)] [N/A:N/A] Granulation A mount: [3:Red, Pink] [8:Pink] [N/A:N/A] Granulation Quality: [3:None Present (0%)] [8:Small (1-33%)] [N/A:N/A] Necrotic A mount: [3:Fat Layer (Subcutaneous Tissue): Yes Fat Layer (Subcutaneous Tissue): Yes N/A] Exposed Structures: [3:Fascia: No Tendon: No Muscle: No Joint: No Bone: No Medium (34-66%)] [8:Fascia: No Tendon: No Muscle: No Joint: No Bone: No None] [N/A:N/A] Epithelialization: [3:N/A] [8:Debridement - Selective/Open Wound N/A] Debridement: Pre-procedure Verification/Time Out N/A  [8:08:27] [N/A:N/A] Taken: [3:N/A] [8:Skin/Epidermis] [N/A:N/A] Level: [3:N/A] [8:3] [N/A:N/A] Debridement A (sq cm): [3:rea N/A] [8:Forceps, Scissors] [N/A:N/A] Instrument: [3:N/A] [8:Minimum] [N/A:N/A] Bleeding: [3:N/A] [8:Pressure] [N/A:N/A] Hemostasis Achieved: [3:N/A] [8:0] [N/A:N/A] Procedural Pain: [3:N/A] [8:0] [N/A:N/A] Post Procedural Pain: [3:N/A] [8:Procedure was tolerated well] [N/A:N/A] Debridement Treatment Response: [3:N/A] [8:2x1.5x0.1] [N/A:N/A] Post Debridement Measurements L x W x D (cm) [3:N/A] [8:0.236] [N/A:N/A] Post Debridement Volume: (cm) [3:T Contact Cast otal] [8:Debridement] [N/A:N/A] Treatment Notes Electronic Signature(s) Signed: 04/27/2021 4:35:26 PM By: [name] MD Signed: 04/27/2021 5:46:53 PM By: [name] RN, BSN Entered By: [name] on 04/27/2021 09:01:37 -------------------------------------------------------------------------------- Multi-Disciplinary Care Plan Details Patient Name: Date of Service: [name], [name] 04/27/2021 7:30 A M Medical Record Number: [id number] Patient Account Number: [id number] Date of Birth/Sex: Treating RN: [birth year] (36 y.o. [name] Primary Care Jaheem Hedgepath: [name] Other Clinician: Referring Ramonia Mcclaran: Treating Anavictoria Wilk/Extender: [name] in Treatment: 78 Multidisciplinary Care Plan reviewed with physician Active Inactive Nutrition Nursing Diagnoses: Imbalanced nutrition Potential for alteratiion in Nutrition/Potential for imbalanced nutrition Goals: Patient/caregiver agrees to and verbalizes understanding of need to use nutritional supplements and/or vitamins as prescribed Date Initiated: 10/24/2019 Date Inactivated: 04/06/2020 Target Resolution Date: 04/03/2020 Goal Status: Met Patient/caregiver will maintain therapeutic glucose control Date Initiated: 10/24/2019 Target Resolution Date: 05/14/2021 Goal Status: Active Interventions: Assess HgA1c  results as ordered upon admission and as needed Assess patient nutrition upon admission and as needed per policy Provide education on elevated blood sugars and impact on wound healing Provide education on nutrition Treatment Activities: Education provided on Nutrition : 12/02/2020 Notes: 11/17/20: Glucose control ongoing issue, target date extended. 01/26/21: Glucose management continues. Wound/Skin Impairment Nursing Diagnoses: Impaired tissue integrity Knowledge deficit related to ulceration/compromised skin integrity Goals: Patient/caregiver will verbalize understanding of skin care regimen Date Initiated: 10/24/2019 Target Resolution Date: 05/15/2021 Goal Status: Active Ulcer/skin breakdown will have a volume reduction of 30% by week 4 Date Initiated: 10/24/2019 Date Inactivated: 01/16/2020 Target Resolution Date: 01/10/2020 Unmet Reason: no change in Goal Status: Unmet measurements. Interventions: Assess patient/caregiver ability to obtain necessary supplies Assess patient/caregiver ability to perform ulcer/skin care regimen upon admission and as needed Assess ulceration(s) every visit Provide education on ulcer and skin care Notes: 11/17/20: Wound care regimen continues Electronic Signature(s) Signed: 05/11/2021 8:14:39 AM By: [name] RN, BSN Entered By: [name] on 04/27/2021 15:47:47 -------------------------------------------------------------------------------- Pain Assessment Details Patient Name: Date of Service: [name], [name] 04/27/2021 7:30 A M Medical Record Number: [id number] Patient Account Number: [id number] Date of Birth/Sex: Treating RN: 04-Nov-1985 (36 y.o. [name] Primary Care Jia Mohamed: [name] Other Clinician: Referring Avner Stroder: Treating Tenita Cue/Extender: [name]  in Treatment: 78 Active Problems Location of Pain Severity and Description of Pain Patient Has Paino No Site Locations Rate the  pain. Rate the pain. Current Pain Level: 0 Pain Management and Medication Current Pain Management: Electronic Signature(s) Signed: 05/11/2021 8:14:39 AM By: [name] RN, BSN Entered By: [name] on 04/27/2021 07:55:39 -------------------------------------------------------------------------------- Patient/Caregiver Education Details Patient Name: Date of Service: [name], [name] 1/24/2023andnbsp7:30 [place] Record Number: [id number] Patient Account Number: [id number] Date of Birth/Gender: Treating RN: October 10, 1985 (36 y.o. [name] Primary Care Physician: [name] Other Clinician: Referring Physician: Treating Physician/Extender: [name] in Treatment: 63 Education Assessment Education Provided To: Patient Education Topics Provided Wound/Skin Impairment: Methods: Explain/Verbal Responses: State content correctly [employer]) Signed: 05/11/2021 8:14:39 AM By: [name] RN, BSN Entered By: [name] on 04/27/2021 15:48:02 -------------------------------------------------------------------------------- Wound Assessment Details Patient Name: Date of Service: [name], [name] 04/27/2021 7:30 A M Medical Record Number: [id number] Patient Account Number: [id number] Date of Birth/Sex: Treating RN: Nov 16, 1985 (36 y.o. [name] Primary Care Roseann Kees: [name] Other Clinician: Referring Elbert Polyakov: Treating Dacoda Finlay/Extender: [name] in Treatment: 78 Wound Status Wound Number: 3 Primary Etiology: Diabetic Wound/Ulcer of the Lower Extremity Wound Location: Left, Lateral Foot Wound Status: Open Wounding Event: Trauma Comorbid History: Type II Diabetes Date Acquired: 10/02/2019 Weeks Of Treatment: 78 Clustered Wound: No Photos Wound Measurements Length: (cm) 0.5 Width: (cm) 0.6 Depth: (cm) 0.2 Area: (cm) 0.236 Volume: (cm) 0.047 % Reduction in Area:  85.7% % Reduction in Volume: 71.5% Epithelialization: Medium (34-66%) Tunneling: No Undermining: No Wound Description Classification: Grade 2 Wound Margin: Thickened Exudate Amount: Medium Exudate Type: Serosanguineous Exudate Color: red, brown Foul Odor After Cleansing: No Slough/Fibrino No Wound Bed Granulation Amount: Large (67-100%) Exposed Structure Granulation Quality: Red, Pink Fascia Exposed: No Necrotic Amount: None Present (0%) Fat Layer (Subcutaneous Tissue) Exposed: Yes Tendon Exposed: No Muscle Exposed: No Joint Exposed: No Bone Exposed: No Electronic Signature(s) Signed: 04/27/2021 5:46:53 PM By: [name] RN, BSN Signed: 05/11/2021 8:14:39 AM By: [name] RN, BSN Entered By: [name] on 04/27/2021 08:10:18 -------------------------------------------------------------------------------- Wound Assessment Details Patient Name: Date of Service: [name], [name] 04/27/2021 7:30 A M Medical Record Number: [id number] Patient Account Number: [id number] Date of Birth/Sex: Treating RN: April 25, 1985 (36 y.o. [name] Primary Care Cache Decoursey: [name] Other Clinician: Referring Talaysia Pinheiro: Treating Deveion Denz/Extender: [name] in Treatment: 78 Wound Status Wound Number: 8 Primary Etiology: Diabetic Wound/Ulcer of the Lower Extremity Wound Location: Right, Lateral Foot Wound Status: Open Wounding Event: Blister Comorbid History: Type II Diabetes Date Acquired: 04/18/2021 Weeks Of Treatment: 0 Clustered Wound: No Photos Wound Measurements Length: (cm) 2 Width: (cm) 1.5 Depth: (cm) 0.1 Area: (cm) 2.356 Volume: (cm) 0.236 % Reduction in Area: -328.4% % Reduction in Volume: -114.5% Epithelialization: None Tunneling: No Undermining: No Wound Description Classification: Grade 2 Wound Margin: Distinct, outline attached Exudate Amount: Medium Exudate Type: Purulent Exudate Color: yellow, brown,  green Foul Odor After Cleansing: No Slough/Fibrino No Wound Bed Granulation Amount: Large (67-100%) Exposed Structure Granulation Quality: Pink Fascia Exposed: No Necrotic Amount: Small (1-33%) Fat Layer (Subcutaneous Tissue) Exposed: Yes Necrotic Quality: Adherent Slough Tendon Exposed: No Muscle Exposed: No Joint Exposed: No Bone Exposed: No Electronic Signature(s) Signed: 04/27/2021 5:46:53 PM By: [name] RN, BSN Signed: 05/11/2021 8:14:39 AM By: [name] RN, BSN Entered By: [name] on 04/27/2021 08:15:09 -------------------------------------------------------------------------------- [place] Details Patient Name: Date of Service: A  RMSTRO NG, [name] 04/27/2021 7:30 A M Medical Record Number: [id number] Patient Account Number: [id number] Date of Birth/Sex: Treating RN: 07/15/1985 (36 y.o. [name] Primary Care Shaddai Shapley: [name] Other Clinician: Referring Jennifr Gaeta: Treating Reyaansh Merlo/Extender: [name] in Treatment: 78 Vital Signs Time Taken: 07:50 Temperature (F): 98.7 Height (in): 77 Pulse (bpm): 93 Source: Stated Respiratory Rate (breaths/min): 18 Weight (lbs): 280 Blood Pressure (mmHg): 173/98 Source: Stated Capillary Blood Glucose (mg/dl): 147 Body Mass Index (BMI): 33.2 Reference Range: 80 - 120 mg / dl Electronic Signature(s) Signed: 05/11/2021 8:14:39 AM By: [name] RN, BSN Entered By: [name] on 04/27/2021 07:55:19

## 2021-05-11 NOTE — Progress Notes (Signed)
[name], [name] ([id number]) Visit Report for 04/21/2021 Arrival Information Details Patient Name: Date of Service: [name] D 04/21/2021 7:45 A M Medical Record Number: [id number] Patient Account Number: [id number] Date of Birth/Sex: Treating RN: [birth year] (36 y.o. [name] Primary Care Markeeta Scalf: [name] Other Clinician: Referring Cassey Bacigalupo: Treating Marvis Saefong/Extender: [name] in Treatment: 104 Visit Information History Since Last Visit Added or deleted any medications: No Patient Arrived: Ambulatory Any new allergies or adverse reactions: No Arrival Time: 07:56 Had a fall or experienced change in No Accompanied By: alone activities of daily living that may affect Transfer Assistance: None risk of falls: Patient Identification Verified: Yes Signs or symptoms of abuse/neglect since last visito No Secondary Verification Process Completed: Yes Hospitalized since last visit: No Patient Requires Transmission-Based Precautions: No Implantable device outside of the clinic excluding No Patient Has Alerts: No cellular tissue based products placed in the center since last visit: Pain Present Now: No Electronic Signature(s) Signed: 05/11/2021 8:13:15 AM By: [name] RN, BSN Entered By: [name] on 04/21/2021 08:00:30 -------------------------------------------------------------------------------- Encounter Discharge Information Details Patient Name: Date of Service: [name], [name] 04/21/2021 7:45 A M Medical Record Number: [id number] Patient Account Number: [id number] Date of Birth/Sex: Treating RN: 1985/04/14 (36 y.o. [name], [name] Primary Care Sunshine Mackowski: [name] Other Clinician: Referring Fernand Sorbello: Treating Tajai Ihde/Extender: [name] in Treatment: 46 Encounter Discharge Information Items Post Procedure Vitals Discharge Condition: Stable Temperature (F): 97.4 Ambulatory  Status: Ambulatory Pulse (bpm): 74 Discharge Destination: Home Respiratory Rate (breaths/min): 17 Transportation: Private Auto Blood Pressure (mmHg): 117/74 Accompanied By: self Schedule Follow-up Appointment: Yes Clinical Summary of Care: Patient Declined Electronic Signature(s) Signed: 04/21/2021 4:57:53 PM By: Rhae Hammock RN Entered By: Rhae Hammock on 04/21/2021 10:09:46 -------------------------------------------------------------------------------- Lower Extremity Assessment Details Patient Name: Date of Service: [name], [name] 04/21/2021 7:45 A M Medical Record Number: [id number] Patient Account Number: [id number] Date of Birth/Sex: Treating RN: Oct 09, 1985 (36 y.o. [name] Primary Care Brandis Matsuura: [name] Other Clinician: Referring Seve Monette: Treating Marino Rogerson/Extender: [name] in Treatment: 77 Edema Assessment Assessed: [name]: No] [name]: No] Edema: [Left: Yes] [Right: Yes] Calf Left: Right: Point of Measurement: 48 cm From Medial Instep 53.5 cm 50 cm Ankle Left: Right: Point of Measurement: 11 cm From Medial Instep 28.5 cm 28.5 cm Vascular Assessment Pulses: Dorsalis Pedis Palpable: [Left:Yes] [Right:Yes] Electronic Signature(s) Signed: 05/11/2021 8:13:15 AM By: [name] RN, BSN Entered By: [name] on 04/21/2021 08:16:41 -------------------------------------------------------------------------------- Multi Wound Chart Details Patient Name: Date of Service: [name], [name] 04/21/2021 7:45 A M Medical Record Number: [id number] Patient Account Number: [id number] Date of Birth/Sex: Treating RN: 1986-02-23 (36 y.o. [name] Primary Care Macee Venables: [name] Other Clinician: Referring Brodie Correll: Treating Meika Earll/Extender: [name] in Treatment: 77 Vital Signs Height(in): 77 Pulse(bpm): 91 Weight(lbs): 280 Blood Pressure(mmHg): 138/89 Body Mass  Index(BMI): 33 Temperature(F): 98.6 Respiratory Rate(breaths/min): 18 Photos: [3:No Photos Left, Lateral Foot] [8:No Photos Right, Lateral Foot] [N/A:N/A N/A] Wound Location: [3:Trauma] [8:Blister] [N/A:N/A] Wounding Event: [3:Diabetic Wound/Ulcer of the Lower] [8:Diabetic Wound/Ulcer of the Lower] [N/A:N/A] Primary Etiology: [3:Extremity Type II Diabetes] [8:Extremity Type II Diabetes] [N/A:N/A] Comorbid History: [3:10/02/2019] [8:04/18/2021] [N/A:N/A] Date Acquired: [3:77] [8:0] [N/A:N/A] Weeks of Treatment: [3:Open] [8:Open] [N/A:N/A] Wound Status: [3:0.5x0.9x0.2] [8:0.7x1x0.2] [N/A:N/A] Measurements L x W x D (cm) [3:0.353] [8:0.55] [N/A:N/A] A (cm) : rea [3:0.071] [8:0.11] [N/A:N/A] Volume (cm) : [3:78.60%] [8:N/A] [N/A:N/A] % Reduction in A rea: [3:57.00%] [  8:N/A] [N/A:N/A] % Reduction in Volume: [8:12] Starting Position 1 (o'clock): [8:12] Ending Position 1 (o'clock): [8:5] Maximum Distance 1 (cm): [3:No] [8:Yes] [N/A:N/A] Undermining: [3:Grade 2] [8:Grade 2] [N/A:N/A] Classification: [3:Medium] [8:Medium] [N/A:N/A] Exudate A mount: [3:Serosanguineous] [8:Purulent] [N/A:N/A] Exudate Type: [3:red, brown] [8:yellow, brown, green] [N/A:N/A] Exudate Color: [3:Thickened] [8:Distinct, outline attached] [N/A:N/A] Wound Margin: [3:Large (67-100%)] [8:Large (67-100%)] [N/A:N/A] Granulation A mount: [3:Red, Pink] [8:Pink] [N/A:N/A] Granulation Quality: [3:None Present (0%)] [8:None Present (0%)] [N/A:N/A] Necrotic A mount: [3:Fat Layer (Subcutaneous Tissue): Yes Fat Layer (Subcutaneous Tissue): Yes N/A] Exposed Structures: [3:Fascia: No Tendon: No Muscle: No Joint: No Bone: No Medium (34-66%)] [8:Fascia: No Tendon: No Muscle: No Joint: No Bone: No None] [N/A:N/A] Treatment Notes Electronic Signature(s) Signed: 04/21/2021 3:43:46 PM By: [name] MD Signed: 04/21/2021 4:57:53 PM By: Rhae Hammock RN Entered By: [name] on 04/21/2021  08:30:34 -------------------------------------------------------------------------------- Multi-Disciplinary Care Plan Details Patient Name: Date of Service: [name], [name] 04/21/2021 7:45 A M Medical Record Number: [id number] Patient Account Number: [id number] Date of Birth/Sex: Treating RN: 1986-02-08 (36 y.o. [name], [name] Primary Care Rini Moffit: [name] Other Clinician: Referring Darien Mignogna: Treating Dennette Faulconer/Extender: [name] in Treatment: 77 [place] reviewed with physician Active Inactive Nutrition Nursing Diagnoses: Imbalanced nutrition Potential for alteratiion in Nutrition/Potential for imbalanced nutrition Goals: Patient/caregiver agrees to and verbalizes understanding of need to use nutritional supplements and/or vitamins as prescribed Date Initiated: 10/24/2019 Date Inactivated: 04/06/2020 Target Resolution Date: 04/03/2020 Goal Status: Met Patient/caregiver will maintain therapeutic glucose control Date Initiated: 10/24/2019 Target Resolution Date: 05/14/2021 Goal Status: Active Interventions: Assess HgA1c results as ordered upon admission and as needed Assess patient nutrition upon admission and as needed per policy Provide education on elevated blood sugars and impact on wound healing Provide education on nutrition Treatment Activities: Education provided on Nutrition : 11/24/2020 Notes: 11/17/20: Glucose control ongoing issue, target date extended. 01/26/21: Glucose management continues. Wound/Skin Impairment Nursing Diagnoses: Impaired tissue integrity Knowledge deficit related to ulceration/compromised skin integrity Goals: Patient/caregiver will verbalize understanding of skin care regimen Date Initiated: 10/24/2019 Target Resolution Date: 05/15/2021 Goal Status: Active Ulcer/skin breakdown will have a volume reduction of 30% by week 4 Date Initiated: 10/24/2019 Date Inactivated:  01/16/2020 Target Resolution Date: 01/10/2020 Unmet Reason: no change in Goal Status: Unmet measurements. Interventions: Assess patient/caregiver ability to obtain necessary supplies Assess patient/caregiver ability to perform ulcer/skin care regimen upon admission and as needed Assess ulceration(s) every visit Provide education on ulcer and skin care Notes: 11/17/20: Wound care regimen continues Electronic Signature(s) Signed: 04/21/2021 4:57:53 PM By: Rhae Hammock RN Entered By: Rhae Hammock on 04/21/2021 08:44:07 -------------------------------------------------------------------------------- Pain Assessment Details Patient Name: Date of Service: [name], [name] 04/21/2021 7:45 A M Medical Record Number: [id number] Patient Account Number: [id number] Date of Birth/Sex: Treating RN: 18-May-1985 (36 y.o. [name] Primary Care Verlin Uher: [name] Other Clinician: Referring Missael [name]: Treating Ac Colan/Extender: [name] in Treatment: 77 Active Problems Location of Pain Severity and Description of Pain Patient Has Paino No Site Locations Pain Management and Medication Current Pain Management: Electronic Signature(s) Signed: 05/11/2021 8:13:15 AM By: [name] RN, BSN Entered By: [name] on 04/21/2021 08:01:41 -------------------------------------------------------------------------------- Patient/Caregiver Education Details Patient Name: Date of Service: [name], [name] 1/18/2023andnbsp7:45 [place] Record Number: [id number] Patient Account Number: [id number] Date of Birth/Gender: Treating RN: 02-07-1986 (36 y.o. [name] Primary Care Physician: [name] Other Clinician: Referring Physician: Treating Physician/Extender: [name] in Treatment: 21 Education  Assessment Education Provided To: Patient Education Topics Provided Wound/Skin Impairment: Methods:  Explain/Verbal Responses: Reinforcements needed, State content correctly Electronic Signature(s) Signed: 04/21/2021 4:57:53 PM By: Rhae Hammock RN Entered By: Rhae Hammock on 04/21/2021 08:44:24 -------------------------------------------------------------------------------- Wound Assessment Details Patient Name: Date of Service: [name], [name] 04/21/2021 7:45 A M Medical Record Number: [id number] Patient Account Number: [id number] Date of Birth/Sex: Treating RN: October 15, 1985 (36 y.o. [name] Primary Care Jacelyn Cuen: [name] Other Clinician: Referring Norman Bier: Treating Joselinne Lawal/Extender: [name] in Treatment: 77 Wound Status Wound Number: 3 Primary Etiology: Diabetic Wound/Ulcer of the Lower Extremity Wound Location: Left, Lateral Foot Wound Status: Open Wounding Event: Trauma Comorbid History: Type II Diabetes Date Acquired: 10/02/2019 Weeks Of Treatment: 77 Clustered Wound: No Wound Measurements Length: (cm) 0.5 Width: (cm) 0.9 Depth: (cm) 0.2 Area: (cm) 0.353 Volume: (cm) 0.071 % Reduction in Area: 78.6% % Reduction in Volume: 57% Epithelialization: Medium (34-66%) Tunneling: No Undermining: No Wound Description Classification: Grade 2 Wound Margin: Thickened Exudate Amount: Medium Exudate Type: Serosanguineous Exudate Color: red, brown Foul Odor After Cleansing: No Slough/Fibrino No Wound Bed Granulation Amount: Large (67-100%) Exposed Structure Granulation Quality: Red, Pink Fascia Exposed: No Necrotic Amount: None Present (0%) Fat Layer (Subcutaneous Tissue) Exposed: Yes Tendon Exposed: No Muscle Exposed: No Joint Exposed: No Bone Exposed: No Electronic Signature(s) Signed: 05/11/2021 8:13:15 AM By: [name] RN, BSN Entered By: [name] on 04/21/2021 08:17:59 -------------------------------------------------------------------------------- Wound Assessment Details Patient Name: Date  of Service: [name], [name] 04/21/2021 7:45 A M Medical Record Number: [id number] Patient Account Number: [id number] Date of Birth/Sex: Treating RN: [birth year] (36 y.o. [name] Primary Care Shaniqua Guillot: [name] Other Clinician: Referring Kaniesha Barile: Treating Marcelis Wissner/Extender: [name] in Treatment: 77 Wound Status Wound Number: 8 Primary Etiology: Diabetic Wound/Ulcer of the Lower Extremity Wound Location: Right, Lateral Foot Wound Status: Open Wounding Event: Blister Comorbid History: Type II Diabetes Date Acquired: 04/18/2021 Weeks Of Treatment: 0 Clustered Wound: No Wound Measurements Length: (cm) 0.7 Width: (cm) 1 Depth: (cm) 0.2 Area: (cm) 0.55 Volume: (cm) 0.11 % Reduction in Area: % Reduction in Volume: Epithelialization: None Tunneling: No Undermining: Yes Starting Position (o'clock): 12 Ending Position (o'clock): 12 Maximum Distance: (cm) 5 Wound Description Classification: Grade 2 Wound Margin: Distinct, outline attached Exudate Amount: Medium Exudate Type: Purulent Exudate Color: yellow, brown, green Foul Odor After Cleansing: No Slough/Fibrino No Wound Bed Granulation Amount: Large (67-100%) Exposed Structure Granulation Quality: Pink Fascia Exposed: No Necrotic Amount: None Present (0%) Fat Layer (Subcutaneous Tissue) Exposed: Yes Tendon Exposed: No Muscle Exposed: No Joint Exposed: No Bone Exposed: No Electronic Signature(s) Signed: 05/11/2021 8:13:15 AM By: [name] RN, BSN Entered By: [name] on 04/21/2021 08:23:31 -------------------------------------------------------------------------------- Vitals Details Patient Name: Date of Service: [name], [name] 04/21/2021 7:45 A M Medical Record Number: [id number] Patient Account Number: [id number] Date of Birth/Sex: Treating RN: 1986-01-26 (36 y.o. [name] Primary Care Montez Cuda: [name] Other Clinician: Referring  Jalasia Eskridge: Treating Jolynda Townley/Extender: [name] in Treatment: 77 Vital Signs Time Taken: 08:00 Temperature (F): 98.6 Height (in): 77 Pulse (bpm): 91 Weight (lbs): 280 Respiratory Rate (breaths/min): 18 Body Mass Index (BMI): 33.2 Blood Pressure (mmHg): 138/89 Reference Range: 80 - 120 mg / dl Electronic Signature(s) Signed: 05/11/2021 8:13:15 AM By: [name] RN, BSN Entered By: [name] on 04/21/2021 08:01:14

## 2021-05-11 NOTE — Progress Notes (Signed)
[name], [name] ([id number]) Visit Report for 05/11/2021 Debridement Details Patient Name: Date of Service: [name] D 05/11/2021 4:00 PM Medical Record Number: [id number] Patient Account Number: [id number] Date of Birth/Sex: Treating RN: Mar 09, 1986 (36 y.o. [name] Primary Care Provider: [name] Other Clinician: Referring Provider: Treating Provider/Extender: [name] in Treatment: 80 Debridement Performed for Assessment: Wound #3 Left,Lateral Foot Performed By: Physician [name]., MD Debridement Type: Debridement Severity of Tissue Pre Debridement: Fat layer exposed Level of Consciousness (Pre-procedure): Awake and Alert Pre-procedure Verification/Time Out Yes - 16:12 Taken: Start Time: 16:12 T Area Debrided (L x W): otal 0.3 (cm) x 0.5 (cm) = 0.15 (cm) Tissue and other material debrided: Non-Viable, Callus, Skin: Epidermis Level: Skin/Epidermis Debridement Description: Selective/Open Wound Instrument: Curette Bleeding: Minimum Hemostasis Achieved: Pressure End Time: 16:14 Procedural Pain: 0 Post Procedural Pain: 0 Response to Treatment: Procedure was tolerated well Level of Consciousness (Post- Awake and Alert procedure): Post Debridement Measurements of Total Wound Length: (cm) 0.3 Width: (cm) 0.5 Depth: (cm) 0.3 Volume: (cm) 0.035 Character of Wound/Ulcer Post Debridement: Improved Severity of Tissue Post Debridement: Fat layer exposed Post Procedure Diagnosis Same as Pre-procedure Electronic Signature(s) Signed: 05/11/2021 5:02:53 PM By: [name] MD Signed: 05/11/2021 6:14:04 PM By: [name] RN, BSN Entered By: [name] on 05/11/2021 16:52:16 -------------------------------------------------------------------------------- Debridement Details Patient Name: Date of Service: [name], [name] 05/11/2021 4:00 PM Medical Record Number: [id number] Patient Account Number: [id number] Date of  Birth/Sex: Treating RN: 09/06/1985 (36 y.o. [name] Primary Care Provider: [name] Other Clinician: Referring Provider: Treating Provider/Extender: [name] in Treatment: 80 Debridement Performed for Assessment: Wound #8 Right,Lateral Foot Performed By: Physician [name]., MD Debridement Type: Debridement Severity of Tissue Pre Debridement: Fat layer exposed Level of Consciousness (Pre-procedure): Awake and Alert Pre-procedure Verification/Time Out Yes - 16:12 Taken: Start Time: 16:12 T Area Debrided (L x W): otal 1 (cm) x 1 (cm) = 1 (cm) Tissue and other material debrided: Non-Viable, Eschar, Slough, Slough Level: Non-Viable Tissue Debridement Description: Selective/Open Wound Instrument: Curette Bleeding: Moderate Hemostasis Achieved: Pressure End Time: 16:14 Procedural Pain: 0 Post Procedural Pain: 0 Response to Treatment: Procedure was tolerated well Level of Consciousness (Post- Awake and Alert procedure): Post Debridement Measurements of Total Wound Length: (cm) 3.2 Width: (cm) 3 Depth: (cm) 0.2 Volume: (cm) 1.508 Character of Wound/Ulcer Post Debridement: Improved Severity of Tissue Post Debridement: Fat layer exposed Post Procedure Diagnosis Same as Pre-procedure Electronic Signature(s) Signed: 05/11/2021 5:02:53 PM By: [name] MD Signed: 05/11/2021 6:14:04 PM By: [name] RN, BSN Entered By: [name] on 05/11/2021 16:52:53 -------------------------------------------------------------------------------- HPI Details Patient Name: Date of Service: [name], [name] 05/11/2021 4:00 PM Medical Record Number: [id number] Patient Account Number: [id number] Date of Birth/Sex: Treating RN: 27-Oct-1985 (36 y.o. 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 He comes in for review of a wound over the plantar fifth metatarsal head extending into the lateral part of the foot. He was followed for this previously by his podiatrist Dr. Cornelius [name]. As the patient tells his story he went to see podiatry first for a swelling he developed on the lateral part of his fifth metatarsal head in May. He states this was "open" by podiatry  and the area closed. He was followed up in June and it was again opened callus removed and it closed promptly. There were plans being made for surgery on the fifth metatarsal head in June however his blood sugar was apparently too high for anesthesia. Apparently the area was debrided and opened again in June and it is never closed since. Looking over the records from podiatry I am really not able to follow this. It was clear when he was first seen it was before 5/14 at that point he already had a wound. By 5/17 the ulcer was resolved. I do not see anything about a procedure. On 5/28 noted to have pre-ulcerative moderate keratosis. X-ray noted 1/5 contracted toe and tailor's bunion and metatarsal deformity. On a visit date on 09/28/2018 the dorsal part of the left foot it healed and resolved. There was concern about swelling in his lower extremity he was sent to the ER.. As far as I can tell he was seen in the ER on 7/12 with an ulcer on his left foot. A DVT rule out of the left leg was negative. I do not think I have complete records from podiatry but I am not able to verify the procedures this patient states he had. He states after the last procedure the wound has never closed although I am not able to follow this in the records I have from podiatry. He has not had a recent x-ray The patient has been using Neosporin on the wound. He is wearing a Darco shoe. He is still very active up on his foot working  and exercising. Past medical history; type 2 diabetes ketosis-prone, leg swelling with a negative DVT study in July. Non-smoker ABI in our clinic was 0.85 on the left 10/16; substantial wound on the plantar left fifth met head extending laterally almost to the dorsal fifth MTP. We have been using silver alginate we gave him a Darco forefoot off loader. An x-ray did not show evidence of osteomyelitis did note soft tissue emphysema which I think was due to gas tracking through an open wound. There is no doubt in my mind he requires an MRI 10/23; MRI not booked until 3 November at the earliest this is largely due to his glucose sensor in the right arm. We have been using silver alginate. There has been an improvement 10/29; I am still not exactly sure when his MRI is booked for. He says it is the third but it is the 10th in epic. This definitely needs to be done. He is running a low-grade fever today but no other symptoms. No real improvement in the 1 02/26/2019 patient presents today for a follow-up visit here in our clinic he is last been seen in the clinic on October 29. Subsequently we were working on getting MRI to evaluate and see what exactly was going on and where we would need to go from the standpoint of whether or not he had osteomyelitis and again what treatments were going be required. Subsequently the patient ended up being admitted to the hospital on 02/07/2019 and was discharged on 02/14/2019. This is a somewhat interesting admission with a discharge diagnosis of pneumonia due to COVID-19 although he was positive for COVID-19 when tested at the urgent care but negative x2 when he was actually in the hospital. With that being said he did have acute respiratory failure with hypoxia and it was noted he also have a left foot ulceration with osteomyelitis. With that being said he  did require oxygen for his pneumonia and I level 4 L. He was placed on antivirals and steroids for the COVID-19. He  was also transferred to the [facility] at one point. Nonetheless he did subsequently discharged home and since being home has done much better in that regard. The CT angiogram did not show any pulmonary embolism. With regard to the osteomyelitis the patient was placed on vancomycin and Zosyn while in the hospital but has been changed to Augmentin at discharge. It was also recommended that he follow- up with wound care and podiatry. Podiatry however wanted him to see [name] according to the patient prior to them doing anything further. His hemoglobin A1c was 9.9 as noted in the hospital. Have an MRI of the left foot performed while in the hospital on 02/04/2019. 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 There is an overlying plantar open wound noted an abscess tracking back along the lateral aspect of the fifth metatarsal shaft. There is otherwise diffuse cellulitis and mild fasciitis without findings of polymyositis. The patient did have recently pneumonia secondary to COVID-19 I looked in the chart through epic and it does appear that the patient may need to have an additional x-ray just to ensure everything is cleared and that he has no airspace disease prior to putting him into the [name]. 03/05/2019; patient was readmitted to the clinic last week. He was hospitalized twice for a viral upper respiratory tract infection from 11/1 through 11/4 and then 11/5 through 11/12 ultimately this turned out to be Covid pneumonitis. Although he was discharged on oxygen he is not using it. He says he feels fine. He has no exercise limitation no cough no sputum. His O2 sat in our clinic today was 100% on room air. 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 He received Vanco and Zosyn in the hospital  and then was discharged on 2 weeks of Augmentin. I do not see any relevant cultures. He was supposed to follow-up with infectious disease but I do not see that he has an appointment. 12/8; patient saw Dr. Novella Olive of infectious disease last week. He felt that he had had adequate antibiotic therapy. He did not go to follow-up with Dr. Amalia Hailey of podiatry and I have again talked to him about the pros and cons of this. He does not want to consider a ray amputation of this time. He is aware of the risks of recurrence, migration etc. He started HBO today and tolerated this well. He can complete the Augmentin that I gave him last week. I have looked over the lab work that Dr. Chana Bode ordered his C-reactive protein was 3.3 and his sedimentation rate was 17. The C-reactive protein is never really been measurably that high in this patient 12/15; not much change in the wound today however he has undermining along the lateral part of the foot again more extensively than last week. He has some rims of epithelialization. We have been using silver alginate. He is undergoing hyperbarics but did not dive today 12/18; in for his obligatory first total contact cast change. Unfortunately there was pus coming from the undermining area around his fifth metatarsal head. This was cultured but will preclude reapplication of a cast. He is seen in conjunction with HBO 12/24; patient had staph lugdunensis in the  wound in the undermining area laterally last time. We put him on doxycycline which should have covered this. The wound looks better today. I am going to give him another week of doxycycline before reattempting the total contact cast 12/31; the patient is completing antibiotics. Hemorrhagic debris in the distal part of the wound with some undermining distally. He also had hyper granulation. Extensive debridement with a #5 curette. The infected area that was on the lateral part of the fifth met head is closed over. I do not  think he needs any more antibiotics. Patient was seen prior to HBO. Preparations for a total contact cast were made in the cast will be placed post hyperbarics 04/11/19; once again the patient arrives today without complaint. He had been in a cast all week noted that he had heavy drainage this week. This resulted in large raised areas of macerated tissue around the wound 1/14; wound bed looks better slightly smaller. Hydrofera Blue has been changing himself. He had a heavy drainage last week which caused a lot of maceration around the wound so I took him out of a total contact cast he says the drainage is actually better this week He is seen today in conjunction with HBO 1/21; returns to clinic. He was up in [place] for a day or 2 attending a funeral. He comes back in with the wound larger and with a large area of exposed bone. He had osteomyelitis and septic arthritis of the fifth left metatarsal head while he was in hospital. He received IV antibiotics in the hospital for a prolonged period of time then 3 weeks of Augmentin. Subsequently I gave him 2 weeks of doxycycline for more superficial wound infection. When I saw this last week the wound was smaller the surface of the wound looks satisfactory. 1/28; patient missed hyperbarics today. Bone biopsy I did last time showed Enterococcus faecalis and Staphylococcus lugdunensis . He has a wide area of exposed bone. We are going to use silver alginate as of today. I had another ethical discussion with the patient. 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 In this situation I think the likelihood of healing this is low. Therefore I have recommended a ray amputation and with the patient's agreement I have referred him to Dr. Doran Durand. The other issue is that his compliance with hyperbarics has been minimal because of his work schedule and given his underlying decision I am going to stop this  today READMISSION 10/24/2019 MRI 09/29/2019 left foot IMPRESSION: 1. Apparent skin ulceration inferior and lateral to the 5th metatarsal base with underlying heterogeneous T2 signal and enhancement in the subcutaneous fat. Small peripherally enhancing fluid collections along the plantar and lateral aspects of the 5th metatarsal base suspicious for abscesses. 2. Interval amputation through the mid 5th metatarsal with nonspecific low-level marrow edema and enhancement. Given the proximity to the adjacent soft tissue inflammatory changes, osteomyelitis cannot be excluded. 3. The additional bones appear unremarkable. MRI 09/29/2019 right foot IMPRESSION: 1. Soft tissue ulceration lateral to the 5th MTP joint. There is low-level T2 hyperintensity within the 4th and 5th metatarsal heads and adjacent proximal phalanges without abnormal T1 signal or cortical destruction. These findings are nonspecific and could be seen with early marrow edema, hyperemia or early osteomyelitis. No evidence of septic joint. 2. Mild tenosynovitis and synovial enhancement associated with the extensor digitorum tendons at the level of the midfoot. 3. Diffuse low-level muscular T2 hyperintensity and enhancement, most consistent with diabetic myopathy. LEFT  FOOT BONE Methicillin resistant staphylococcus aureus Staphylococcus lugdunensis MIC MIC CIPROFLOXACIN >=8 RESISTANT Resistant <=0.5 SENSI... Sensitive CLINDAMYCIN <=0.25 SENS... Sensitive >=8 RESISTANT Resistant ERYTHROMYCIN >=8 RESISTANT Resistant >=8 RESISTANT Resistant GENTAMICIN <=0.5 SENSI... Sensitive <=0.5 SENSI... Sensitive Inducible Clindamycin NEGATIVE Sensitive NEGATIVE Sensitive OXACILLIN >=4 RESISTANT Resistant 2 SENSITIVE Sensitive RIFAMPIN <=0.5 SENSI... Sensitive <=0.5 SENSI... Sensitive TETRACYCLINE <=1 SENSITIVE Sensitive <=1 SENSITIVE Sensitive TRIMETH/SULFA <=10 SENSIT Sensitive <=10 SENSIT Sensitive ... Marland Kitchen.. VANCOMYCIN 1 SENSITIVE  Sensitive <=0.5 SENSI... Sensitive Right foot bone . Component 3 wk ago Specimen Description BONE Special Requests RIGHT 4 METATARSAL SAMPLE B Gram Stain NO WBC SEEN NO ORGANISMS SEEN Culture RARE METHICILLIN RESISTANT STAPHYLOCOCCUS AUREUS NO ANAEROBES ISOLATED Performed at [facility], [place] [address] [place], [place] [address] Report Status 10/08/2019 FINAL Organism ID, Bacteria METHICILLIN RESISTANT STAPHYLOCOCCUS AUREUS Resulting Agency [facility] Susceptibility Methicillin resistant staphylococcus aureus MIC CIPROFLOXACIN >=8 RESISTANT Resistant CLINDAMYCIN <=0.25 SENS... Sensitive ERYTHROMYCIN >=8 RESISTANT Resistant GENTAMICIN <=0.5 SENSI... Sensitive Inducible Clindamycin NEGATIVE Sensitive OXACILLIN >=4 RESISTANT Resistant RIFAMPIN <=0.5 SENSI... Sensitive TETRACYCLINE <=1 SENSITIVE Sensitive TRIMETH/SULFA <=10 SENSIT Sensitive ... VANCOMYCIN 1 SENSITIVE Sensitive This is a patient we had in clinic earlier this year with a wound over his left fifth metatarsal head. 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 Doran Durand on 05/16/2019. He seems to have developed problems with wounds on his bilateral feet in June 2021 although he may have had problems earlier than this. He was in an urgent care with a right foot ulcer on 09/26/2019 and given a course of doxycycline. This was apparently after having trouble getting into see orthopedics. He was seen by podiatry on 09/28/2019 noted to have bilateral lower extremity ulcers including the left lateral fifth metatarsal base and the right subfifth met head. It was noted that had purulent drainage at that time. He required hospitalization from 6/20 through  7/2. This was because of worsening right foot wounds. He underwent bilateral operative incision and drainage and bone biopsies bilaterally. Culture results are listed above. He has been referred back to clinic by Dr. Jacqualyn Posey of podiatry. He is also followed by Dr. Megan Salon who saw him yesterday. He was discharged from hospital on Zyvox Flagyl and Levaquin and yesterday changed to doxycycline Flagyl and Levaquin. His inflammatory markers on 6/26 showed a sedimentation rate of 129 and a C-reactive protein of 5. This is improved to 14 and 1.3 respectively. This would indicate improvement. ABIs in our clinic today were 1.23 on the right and 1.20 on the left 11/01/2019 on evaluation today patient appears to be doing fairly well in regard to the wounds on his feet at this point. Fortunately there is no signs of active infection at this time. No fevers, chills, nausea, vomiting, or diarrhea. He currently is seeing infectious disease and still under their care at this point. Subsequently he also has both wounds which she has not been using collagen on as he did not receive that in his packaging he did not call us and let us know that. Apparently that just was missed on the order. Nonetheless we will get that straightened out today. 8/9-Patient returns for bilateral foot wounds, using Prisma with hydrogel moistened dressings, and the wounds appear stable. Patient using surgical shoes, avoiding much  pressure or weightbearing as much as possible 8/16; patient has bilateral foot wounds. 1 on the right lateral foot proximally the other is on the left mid lateral foot. Both required debridement of callus and thick skin around the wounds. We have been using silver collagen 8/27; patient has bilateral lateral foot wounds. The area on the left substantially surrounded by callus and dry skin. This was removed from the wound edge. The underlying wound is small. The area on the right measured somewhat smaller today. We've  been using silver collagen the patient was on antibiotics for underlying osteomyelitis in the left foot. Unfortunately I did not update his antibiotics during today's visit. 9/10 I reviewed Dr. Hale Bogus last notes he felt he had completed antibiotics his inflammatory markers were reasonably well controlled. He has a small wound on the lateral left foot and a tiny area on the right which is just above closed. He is using Hydrofera Blue with border foam he has bilateral surgical shoes 9/24; 2 week f/u. doing well. right foot is closed. left foot still undermined. 10/14; right foot remains closed at the fifth met head. The area over the base of the left fifth metatarsal has a small open area but considerable undermining towards the plantar foot. Thick callus skin around this suggests an adequate pressure relief. We have talked about this. He says he is going to go back into his cam boot. I suggested a total contact cast he did not seem enamored with this suggestion 10/26; left foot base of the fifth metatarsal. Same condition as last time. He has skin over the area with an open wound however the skin is not adherent. He went to see Dr. Earleen Newport who did an x-ray and culture of his foot I have not reviewed the x-ray but the patient was not told anything. He is on doxycycline 11/11; since the patient was last here he was in the emergency room on 10/30 he was concerned about swelling in the left foot. They did not do any cultures or x-rays. They changed his antibiotics to cephalexin. Previous culture showed group B strep. The cephalexin is appropriate as doxycycline has less than predictable coverage. Arrives in clinic today with swelling over this area under the wound. He also has a new wound on the right fifth metatarsal head 11/18; the patient has a difficult wound on the lateral aspect of the left fifth metatarsal head. The wound was almost ballotable last week I opened it slightly expecting to see  purulence however there was just bleeding. I cultured this this was negative. X-ray unchanged. We are trying to get an MRI but I am not sure were going to be able to get this through his insurance. He also has an area on the right lateral fifth metatarsal head this looks healthier 12/3; the patient finally got our MRI. Surprisingly this did not show osteomyelitis. I did show the soft tissue ulceration at the lateral plantar aspect of the fifth metatarsal base with a tiny residual 6 mm abscess overlying the superficial fascia I have tried to culture this area I have not been able to get this to grow anything. Nevertheless the protruding tissue looks aggravated. I suspect we should try to treat the underlying "abscess with broad-spectrum antibiotics. I am going to start him on Levaquin and Flagyl. He has much less edema in his legs and I am going to continue to wrap his legs and see him weekly 12/10. I started Levaquin and Flagyl on him last week.  He just picked up the Flagyl apparently there was some delay. The worry is the wound on the left fifth metatarsal base which is substantial and worsening. His foot looks like he inverts at the ankle making this a weightbearing surface. Certainly no improvement in fact I think the measurements of this are somewhat worse. We have been using 12/17; he apparently just got the Levaquin yesterday this is 2 weeks after the fact. He has completed the Flagyl. The area over the left fifth metatarsal base still has protruding granulation tissue although it does not look quite as bad as it did some weeks ago. He has severe bilateral lymphedema although we have not been treating him for wounds on his legs this is definitely going to require compression. There was so much edema in the left I did not wish to put him in a total contact cast today. I am going to increase his compression from 3-4 layer. The area on the right lateral fifth met head actually look quite good and  superficial. 12/23; patient arrived with callus on the right fifth met head and the substantial hyper granulated callused wound on the base of his fifth metatarsal. He says he is completing his Levaquin in 2 days but I do not think that adds up with what I gave him but I will have to double check this. We are using Hydrofera Blue on both areas. My plan is to put the left leg in a cast the week after New Year's 04/06/2020; patient's wounds about the same. Right lateral fifth metatarsal head and left lateral foot over the base of the fifth metatarsal. There is undermining on the left lateral foot which I removed before application of total contact cast continuing with Hydrofera Blue new. Patient tells me he was seen by endocrinology today lab work was done [Dr. Kerr]. Also wondering whether he was referred to cardiology. I went over some lab work from previously does not have chronic renal failure certainly not nephrotic range proteinuria he does have very poorly controlled diabetes but this is not his most updated lab work. Hemoglobin A1c has been over 11 1/10; the patient had a considerable amount of leakage towards mid part of his left foot with macerated skin however the wound surface looks better the area on the right lateral fifth met head is better as well. I am going to change the dressing on the left foot under the total contact cast to silver alginate, continue with Hydrofera Blue on the right. 1/20; patient was in the total contact cast for 10 days. Considerable amount of drainage although the skin around the wound does not look too bad on the left foot. The area on the right fifth metatarsal head is closed. Our nursing staff reports large amount of drainage out of the left lateral foot wound 1/25; continues with copious amounts of drainage described by our intake staff. PCR culture I did last week showed E. coli and Enterococcus faecalis and low quantities. Multiple resistance genes documented  including extended spectrum beta lactamase, MRSA, MRSE, quinolone, tetracycline. The wound is not quite as good this week as it was 5 days ago but about the same size 2/3; continues with copious amounts of malodorous drainage per our intake nurse. The PCR culture I did 2 weeks ago showed E. coli and low quantities of Enterococcus. There were multiple resistance genes detected. I put Neosporin on him last week although this does not seem to have helped. The wound is slightly deeper today.  Offloading continues to be an issue here although with the amount of drainage she has a total contact cast is just not going to work 2/10; moderate amount of drainage. Patient reports he cannot get his stocking on over the dressing. I told him we have to do that the nurse gave him suggestions on how to make this work. The wound is on the bottom and lateral part of his left foot. Is cultured predominantly grew low amounts of Enterococcus, E. coli and anaerobes. There were multiple resistance genes detected including extended spectrum beta lactamase, quinolone, tetracycline. I could not think of an easy oral combination to address this so for now I am going to do topical antibiotics provided by [facility] I think the main agents here are vancomycin and an aminoglycoside. We have to be able to give him access to the wounds to get the topical antibiotic on 2/17; moderate amount of drainage this is unchanged. He has his Keystone topical antibiotic against the deep tissue culture organisms. He has been using this and changing the dressing daily. Silver alginate on the wound surface. 2/24; using Keystone antibiotic with silver alginate on the top. He had too much drainage for a total contact cast at one point although I think that is improving and I think in the next week or 2 it might be possible to replace a total contact cast I did not do this today. In general the wound surface looks healthy however he continues to have  thick rims of skin and subcutaneous tissue around the wide area of the circumference which I debrided 06/04/2020 upon evaluation today patient appears to be doing well in regard to his wound. I do feel like he is showing signs of improvement. There is little bit of callus and dead tissue around the edges of the wound as well as what appears to be a little bit of a sinus tract that is off to the side laterally I would perform debridement to clear that away today. 3/17; left lateral foot. The wound looks about the same as I remember. Not much depth surface looks healthy. No evidence of infection 3/25; left lateral foot. Wound surface looks about the same. Separating epithelium from the circumference. There really is no evidence of infection here however not making progress by my view 3/29; left lateral foot. Surface of the wound again looks reasonably healthy still thick skin and subcutaneous tissue around the wound margins. There is no evidence of infection. One of the concerns being brought up by the nurses has again the amount of drainage vis--vis continued use of a total contact cast 4/5; left lateral foot at roughly the base of the fifth metatarsal. Nice healthy looking granulated tissue with rims of epithelialization. The overall wound measurements are not any better but the tissue looks healthy. The only concern is the amount of drainage although he has no surrounding maceration with what we have been doing recently to absorb fluid and protect his skin. He also has lymphedema. 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 He states that the kerlix/coban under the TCC rolled down and could not get it back up. He has been using an offloading scooter and has  somehow hurt his right foot using this  device. This happened last week. He states that the side of his right foot developed a blister and opened. The top of his foot also has a few small open wounds he thinks is due to his socks rubbing in his shoes. He has not been using any dressings to the wound. He denies purulent drainage, fever/chills or erythema to the wounds. 4/22; patient presents for 1 week follow-up. He developed new wounds to the right foot that were evaluated at last clinic visit. He continues to have a total contact cast to the left leg and he reports no issues. He has been using silver collagen to the right foot wounds with no issues. He denies purulent drainage, fever/chills or erythema to the right foot wounds. He has no complaints today 4/25; patient presents for 1 week follow-up. He has a total contact cast of the left leg and reports no issues. He has been using silver alginate to the right foot wound. He denies purulent drainage, fever/chills or erythema to the right foot wounds. 5/2 patient presents for 1 week follow-up. T contact cast on the left. The wound which is on the base of the plantar foot at the base of the fifth metatarsal otal actually looks quite good and dimensions continue to gradually contract. HOWEVER the area on the right lateral fifth metatarsal head is much larger than what I remember from 2 weeks ago. Once more is he has significant levels of hypergranulation. Noteworthy that he had this same hyper granulated response on his wound on the left foot at one point in time. So much so that he I thought there was an underlying fluid collection. Based on this I think this just needs debridement. 5/9; the wound on the left actually continues to be gradually smaller with a healthy surface. Slight amount of drainage and maceration of the skin around but not too bad. However he has a large wound over the right fifth metatarsal head very much in the same configuration  as his left foot wound was initially. I used silver nitrate to address the hyper granulated tissue no mechanical debridement 5/16; area on the left foot did not look as healthy this week deeper thick surrounding macerated skin and subcutaneous tissue. The area on the right foot fifth met head was about the same The area on the right ankle that we identified last week is completely broken down into an open wound presumably a stocking rubbing issue 5/23; patient has been using a total contact cast to the left side. He has been using silver alginate underneath. He has also been using silver alginate to the right foot wounds. He has no complaints today. He denies any signs of infection. 5/31; the left-sided wound looks some better measure smaller surface granulation looks better. We have been using silver alginate under the total contact cast The large area on his right fifth met head and right dorsal foot look about the same still using silver alginate 6/6; neither side is good as I was hoping although the surface area dimensions are better. A lot of maceration on his left and right foot around the wound edge. Area on the dorsal right foot looks better. He says he was traveling. I am not sure what does the amount of maceration around the plantar wounds may be drainage issues 6/13; in general the wound surfaces look quite good on both sides. Macerated skin and raised edges around the wound required debridement although in general especially on the left the surface area seems  improved. The area on the right dorsal ankle is about the same I thought this would not be such a problem to close 6/20; not much change in either wound although the one on the right looks a little better. Both wounds have thick macerated edges to the skin requiring debridements. We have been using silver alginate. The area on the dorsal right ankle is still open I thought this would be closed. 6/28; patient comes in today with a  marked deterioration in the right foot wound fifth met head. Wide area of exposed bone this is a drastic change from last time. The area on the left there we have been casting is stagnant. We have been using silver alginate in both wound areas. 7/5; bone culture I did for PCR last time was positive for Pseudomonas, group B strep, Enterococcus and Staph aureus. There was no suggestion of methicillin resistance or ampicillin resistant genes. This was resistant to tetracycline however He comes into the clinic today with the area over his right plantar fifth metatarsal head which had been doing so well 2 weeks ago completely necrotic feeling bone. I do not know that this is going to be salvageable. The left foot wound is certainly no smaller but it has a better surface and is superficial. 7/8; patient called in this morning to say that his total contact cast was rubbing against his foot. He states he is doing fine overall. He denies signs of infection. 7/12; continued deterioration in the wound over the right fifth metatarsal head crumbling bone. This is not going to be salvageable. The patient agrees and wants to be referred to Dr. Doran Durand which we will attempt to arrange as soon as possible. I am going to continue him on antibiotics as long as that takes so I will renew those today. The area on the left foot which is the base of the fifth metatarsal continues to look somewhat better. Healthy looking tissue no depth no debridement is necessary here. 7/20; the patient was kindly seen by Dr. Doran Durand of orthopedics on 10/19/2020. 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 Towards this end we have taken him out of the total contact cast on the left we will put him in a wrap with Hydrofera Blue. As I understand things surgery is planned for 7/21 7/27; patient had his surgery last Thursday. 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 Apparently everything went well we did not still disturb that today The area on the left foot actually looks quite good. He has been much less mobile which probably explains this he did not seem to do well in the total contact cast secondary to drainage and maceration I think. 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 Fortunately there is no evidence of active infection at this time. No fevers, chills, nausea, vomiting, or diarrhea. Overall I think that he is actually doing extremely well. Nonetheless I do believe that he is staying off of this more following the surgery in his right foot that is the reason the left is doing so great. 8/16; left plantar foot wound. This looks smaller than the last time I saw this he is using Hydrofera Blue. The surgical wound on the right foot is being followed by Dr. Doran Durand we did  not look at this today. He has surgical shoes on both feet 8/23; left plantar foot wound not as good this week. Surrounding macerated skin and subcutaneous tissue everything looks moist and wet. I do not think he is offloading this adequately. He is using a surgical shoe Apparently the right foot surgical wound is not open although I did not check his foot 8/31; left plantar foot lateral aspect. Much improved this week. He has no maceration. Some improvement in the surface area of the wound but most impressively the depth is come in we are using silver alginate. The patient is a Product/process development scientist. He is asked that we write him a letter so he can go back to work. I have also tried to see if we can write something that will allow him to limit the amount of time that he is on his foot at work. 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 Hopefully the latter can be avoided 9/6; I believe  the patient missed an appointment last week. He arrives in today with a wound looking roughly the same certainly no better. Undermining laterally and also inferiorly. We used molecuLight today in training with the patient's permission.. We are using silver alginate 9/21 wound is measuring bigger this week although this may have to do with the aggressive circumferential debridement last week in response to the blush fluorescence on the MolecuLight. Culture I did last week showed significant MSSA and E. coli. I put him on Augmentin but he has not started it yet. We are also going to send this for compounded antibiotics at [facility]. There is no evidence of systemic infection 9/29; silver alginate. His Keystone arrived. He is completing Augmentin in 2 days. Offloading in a cam boot. Moderate drainage per our intake staff 10/5; using silver alginate. He has been using his [employer]. He has completed his Augmentin. Per our intake nurse still a lot of drainage, far too much to consider a total contact cast. Wound measures about the same. He had the same undermining area that I defined last week from a roughly 11-3. I remove this today 10/12; using silver alginate he is using the [place]. He comes in for a nurse visit hence we are applying [employer] twice a week. Measuring slightly better today and less notable drainage. Extensive debridement of the wound edge last time 10/18; using topical Keystone and silver alginate and a soft cast. Wound measurements about the same. Drainage was through his soft cast. We are changing this twice a week Tuesdays and Friday 10/25; comes in with moderate drainage. Still using Keystone silver alginate and a soft cast. Wound dimensions completely the same.He has a lot of edema in the left leg he has lymphedema. Asking for [name] to consider wrapping him as he cannot get his stocking on over the soft cast 11/2; comes in with moderate to large drainage slightly smaller in terms of  width we have been using [place]. His wound looks satisfactory but not much improvement 11/4; patient presents today for obligatory cast change. Has no issues or complaints today. He denies signs of infection. 11/9; patient traveled this weekend to [place], was on the cast quite a bit. Staining of the cast with black material from his walking boot. Drainage was not quite as bad as we feared. Using silver alginate and Keystone 11/16; we do not have size for cast therefore we have been putting a  soft cast on him since the change on Friday. Still a significant amount of drainage necessitating changing twice a week. We have been using the Keystone at cast changes either hard or soft as well as silver alginate Comes in the clinic with things actually looking fairly good improvement in width. He says his offloading is about the same 02/24/2021 upon evaluation today patient actually comes back in and is doing excellent in regard to his foot ulcer this is significantly smaller even compared to the last visit. The soft cast seems to have done extremely well for him which is great news. I do not see any signs of infection minimal debridement will be needed today. 11/30; left lateral foot much improved half a centimeter improvement in surface area. No evidence of infection. He seems to be doing better with the soft cast in the TCC therefore we will continue with this. He comes back in later in the week for a change with the nurses. This is due to drainage 12/6; no improvement in dimensions. Under illumination some debris on the surface we have been using silver alginate, soft cast. If there is anything optimistic here he seems to have have less drainage 12/13. Dimensions are improved both length and width and slightly in depth. Appears to be quite healthy today. Raised edges of this thick skin and callus around the edges however. He is in a soft cast were bringing him back once for a change on Friday. Drainage is  better 12/20. Dimensions are improved. He still has raised edges of thick skin and callus around the edges. We are using a soft cast 12/28; comes in today with thick callus around the wound. Using silver under alginate under a soft cast. I do not think there is much improvement in any measurement 2023 04/06/2021; patient was put in a total contact cast. Unfortunately not much change in surface area 1/10; not much different still thick callus and skin around the edge in spite of the total contact cast. This was just debrided last week we have been using the [facility] compounded antibiotic and silver alginate under a total contact cast 1/18 the patient's wound on the left side is doing nicely. smaller HOWEVER he comes in today with a wound on the right foot laterally. blister most likely serosangquenous drainage 1/24; the patient continues to do well in terms of the plantar left foot which is continued to contract using silver alginate under the total contact cast HOWEVER the right lateral foot is bigger with denuded skin around the edges. I used pickups and a #15 scalpel to remove this this looks like the remanence of a large blister. Cannot rule out infection. Culture in this area I did last week showed Staphylococcus lugdunensis few colonies. I am going to try to address this with his [employer] antibiotic that is done so well on the left having linezolid and this should cover the staph 2/1; the patient's wound on his left foot which was the original plantar foot wound thick skin and eschar around the edges even in the total contact cast but the wound surface does not look too bad The real problem is on how his right lateral foot at roughly the base of the fifth metatarsal. The wound is completely necrotic more worrisome than that there is swelling around the edges of this. We have been using silver alginate on both wounds and Keystone on the right foot. Unfortunately I think he is going to require  systemic antibiotics while we await  cultures. He did not get the x-ray done that we ordered last week [lost the prescription 2/7; disappointingly in the area on the left foot which we are treating with a total contact cast is still not closed although it is much smaller. He continues to have a lot of callus around the wound edge. -Right lateral foot culture I did last week was negative x-ray also negative for osteomyelitis. Electronic Signature(s) Signed: 05/11/2021 5:02:53 PM By: [name] MD Entered By: [name] on 05/11/2021 16:54:31 -------------------------------------------------------------------------------- Physical Exam Details Patient Name: Date of Service: [name], [name] 05/11/2021 4:00 PM Medical Record Number: [id number] Patient Account Number: [id number] Date of Birth/Sex: Treating RN: 1985-12-15 (36 y.o. [name] Primary Care Provider: [name] Other Clinician: Referring Provider: Treating Provider/Extender: [name] in Treatment: 80 Constitutional Sitting or standing Blood Pressure is within target range for patient.. Pulse regular and within target range for patient.Marland Kitchen Respirations regular, non-labored and within target range.. Temperature is normal and within the target range for the patient.Marland Kitchen Appears in no distress. Notes Wound exam; his left foot looks about the same. Thick surrounding eschar which I remove each week with a #3 curette still using silver alginate under a total contact cast but I will change the dressing next week if this is not any better Right lateral foot wound looks as though it has a better surface. I removed some easily removed eschar again with a #3 curette. minimal bleeding Electronic Signature(s) Signed: 05/11/2021 5:02:53 PM By: [name] MD Entered By: [name] on 05/11/2021 16:56:22 -------------------------------------------------------------------------------- Physician  Orders Details Patient Name: Date of Service: [name], [name] 05/11/2021 4:00 PM Medical Record Number: [id number] Patient Account Number: [id number] Date of Birth/Sex: Treating RN: 11-18-1985 (36 y.o. 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 - Dr. Dellia Nims Return A Bathing/ Shower/ Hygiene May shower with protection but do not get wound dressing(s) wet. - Use Cast Protector Bag Edema Control - Lymphedema / SCD / Other Bilateral Lower Extremities Elevate legs to the level of the heart or above for 30 minutes daily and/or when sitting, a frequency of: - throughout the day Avoid standing for long periods of time. Exercise regularly Moisturize legs daily. - right leg every night before bed. Compression stocking or Garment 20-30 mm/Hg pressure to: - Apply to right leg in the morning and remove at night. Off-Loading Total Contact Cast to Left Lower Extremity Open toe surgical shoe to: - right foot Other: - minimal weight bearing left foot Additional Orders / Instructions Follow Nutritious Diet Wound Treatment Wound #3 - Foot Wound Laterality: Left, Lateral Cleanser: Soap and Water 1 x Per Week/7 Days Discharge Instructions: May shower and wash wound with dial antibacterial soap and water prior to dressing change. Topical: keystone 1 x Per Week/7 Days Prim Dressing: KerraCel Ag Gelling Fiber Dressing, 4x5 in (silver alginate) 1 x Per Week/7 Days ary Discharge Instructions: Apply silver alginate to wound  bed as instructed Secondary Dressing: Woven Gauze Sponge, Non-Sterile 4x4 in 1 x Per Week/7 Days Discharge Instructions: Apply over primary dressing as directed.  Secondary Dressing: Optifoam Non-Adhesive Dressing, 4x4 in 1 x Per Week/7 Days Discharge Instructions: Apply over primary dressing as directed. Compression Wrap: Kerlix Roll 4.5x3.1 (in/yd) 1 x Per Week/7 Days Discharge Instructions: Apply Kerlix and Coban compression as directed. Compression Wrap: Coban Self-Adherent Wrap 4x5 (in/yd) 1 x Per Week/7 Days Discharge Instructions: Apply over Kerlix as directed. Wound #8 - Foot Wound Laterality: Right, Lateral Cleanser: Soap and Water Every Other Day/15 Days Discharge Instructions: May shower and wash wound with dial antibacterial soap and water prior to dressing change. Topical: keystone Every Other Day/15 Days Prim Dressing: KerraCel Ag Gelling Fiber Dressing, 4x5 in (silver alginate) (DME) (Generic) Every Other Day/15 Days ary Discharge Instructions: Apply silver alginate to wound bed as instructed Secondary Dressing: Woven Gauze Sponge, Non-Sterile 4x4 in (DME) (Generic) Every Other Day/15 Days Discharge Instructions: Apply over primary dressing as directed. Secondary Dressing: Optifoam Non-Adhesive Dressing, 4x4 in (DME) (Generic) Every Other Day/15 Days Discharge Instructions: Apply over primary dressing as directed. Secured With: Coban Self-Adherent Wrap 4x5 (in/yd) (DME) (Generic) Every Other Day/15 Days Discharge Instructions: Secure with Coban as directed. Secured With: [employer], 4.5x3.1 (in/yd) (DME) (Generic) Every Other Day/15 Days Discharge Instructions: Secure with Kerlix as directed. Secured With: 69M Medipore H Soft Cloth Surgical T ape, 4 x 10 (in/yd) (DME) (Generic) Every Other Day/15 Days Discharge Instructions: Secure with tape as directed. Electronic Signature(s) Signed: 05/11/2021 5:02:53 PM By: [name] MD Signed: 05/11/2021 6:14:04 PM By: [name] RN, BSN Signed: 05/11/2021 6:14:04 PM By: [name] RN, BSN Entered By: [name] on 05/11/2021 16:46:30 -------------------------------------------------------------------------------- Problem List Details Patient Name: Date of Service: [name], [name] 05/11/2021 4:00 PM Medical Record Number: [id number] Patient Account Number: [id number] Date of Birth/Sex: Treating RN: 11-01-1985 (36 y.o. [name] Primary Care Provider: [name] Other Clinician: Referring Provider: Treating Provider/Extender: [name] in Treatment: 61 Active Problems ICD-10 Encounter Code Description Active Date MDM Diagnosis E11.621 Type 2 diabetes mellitus with foot ulcer 10/24/2019 No Yes L97.528 Non-pressure chronic ulcer of other part of left foot with other specified 10/24/2019 No Yes severity L97.518 Non-pressure chronic ulcer of other part of right foot with other specified 10/24/2019 No Yes severity Inactive Problems ICD-10 Code Description Active Date Inactive Date L97.518 Non-pressure chronic ulcer of other part of right foot with other specified severity 07/14/2020 07/14/2020 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, right ankle and foot 10/24/2019 10/24/2019 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 08/10/2020 08/10/2020 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, left ankle and foot 10/24/2019 10/24/2019 B95.62 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 10/24/2019 10/24/2019 classified elsewhere Resolved Problems Electronic Signature(s) Signed: 05/11/2021 5:02:53 PM By: [name] MD Entered By: [name] on 05/11/2021 16:51:49 -------------------------------------------------------------------------------- Progress Note Details Patient Name: Date of Service: [name], [name] 05/11/2021 4:00 PM Medical Record Number: [id number] Patient Account Number:  [id number] Date of Birth/Sex: Treating RN: October 10, 1985 (36 y.o. 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 He comes in for review of a wound over the plantar fifth metatarsal head extending into the lateral part of the foot. He was followed for this previously by his podiatrist Dr. Cornelius [name]. As the patient tells his story he went to see podiatry first for  a swelling he developed on the lateral part of his fifth metatarsal head in May. He states this was "open" by podiatry and the area closed. He was followed up in June and it was again opened callus removed and it closed promptly. There were plans being made for surgery on the fifth metatarsal head in June however his blood sugar was apparently too high for anesthesia. Apparently the area was debrided and opened again in June and it is never closed since. Looking over the records from podiatry I am really not able to follow this. It was clear when he was first seen it was before 5/14 at that point he already had a wound. By 5/17 the ulcer was resolved. I do not see anything about a procedure. On 5/28 noted to have pre-ulcerative moderate keratosis. X-ray noted 1/5 contracted toe and tailor's bunion and metatarsal deformity. On a visit date on 09/28/2018 the dorsal part of the left foot it healed and resolved. There was concern about swelling in his lower extremity he was sent to the ER.. As far as I can tell he was seen in the ER on 7/12 with an ulcer on his left foot. A DVT rule out of the left leg was negative. I do not think I have complete records from podiatry but I am not able to verify the procedures this patient states he had. He states after the last procedure the  wound has never closed although I am not able to follow this in the records I have from podiatry. He has not had a recent x-ray The patient has been using Neosporin on the wound. He is wearing a Darco shoe. He is still very active up on his foot working and exercising. Past medical history; type 2 diabetes ketosis-prone, leg swelling with a negative DVT study in July. Non-smoker ABI in our clinic was 0.85 on the left 10/16; substantial wound on the plantar left fifth met head extending laterally almost to the dorsal fifth MTP. We have been using silver alginate we gave him a Darco forefoot off loader. An x-ray did not show evidence of osteomyelitis did note soft tissue emphysema which I think was due to gas tracking through an open wound. There is no doubt in my mind he requires an MRI 10/23; MRI not booked until 3 November at the earliest this is largely due to his glucose sensor in the right arm. We have been using silver alginate. There has been an improvement 10/29; I am still not exactly sure when his MRI is booked for. He says it is the third but it is the 10th in epic. This definitely needs to be done. He is running a low-grade fever today but no other symptoms. No real improvement in the 1 02/26/2019 patient presents today for a follow-up visit here in our clinic he is last been seen in the clinic on October 29. Subsequently we were working on getting MRI to evaluate and see what exactly was going on and where we would need to go from the standpoint of whether or not he had osteomyelitis and again what treatments were going be required. Subsequently the patient ended up being admitted to the hospital on 02/07/2019 and was discharged on 02/14/2019. This is a somewhat interesting admission with a discharge diagnosis of pneumonia due to COVID-19 although he was positive for COVID-19 when tested at the urgent care but negative x2 when he was actually in the hospital. With that being said he did  have  acute respiratory failure with hypoxia and it was noted he also have a left foot ulceration with osteomyelitis. With that being said he did require oxygen for his pneumonia and I level 4 L. He was placed on antivirals and steroids for the COVID-19. He was also transferred to the [facility] at one point. Nonetheless he did subsequently discharged home and since being home has done much better in that regard. The CT angiogram did not show any pulmonary embolism. With regard to the osteomyelitis the patient was placed on vancomycin and Zosyn while in the hospital but has been changed to Augmentin at discharge. It was also recommended that he follow- up with wound care and podiatry. Podiatry however wanted him to see [name] according to the patient prior to them doing anything further. His hemoglobin A1c was 9.9 as noted in the hospital. Have an MRI of the left foot performed while in the hospital on 02/04/2019. 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 There is an overlying plantar open wound noted an abscess tracking back along the lateral aspect of the fifth metatarsal shaft. There is otherwise diffuse cellulitis and mild fasciitis without findings of polymyositis. The patient did have recently pneumonia secondary to COVID-19 I looked in the chart through epic and it does appear that the patient may need to have an additional x-ray just to ensure everything is cleared and that he has no airspace disease prior to putting him into the [name]. 03/05/2019; patient was readmitted to the clinic last week. He was hospitalized twice for a viral upper respiratory tract infection from 11/1 through 11/4 and then 11/5 through 11/12 ultimately this turned out to be Covid pneumonitis. Although he was discharged on oxygen he is not using it. He says he feels fine. He has no exercise limitation no cough no sputum.  His O2 sat in our clinic today was 100% on room air. 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 He received Vanco and Zosyn in the hospital and then was discharged on 2 weeks of Augmentin. I do not see any relevant cultures. He was supposed to follow-up with infectious disease but I do not see that he has an appointment. 12/8; patient saw Dr. Novella Olive of infectious disease last week. He felt that he had had adequate antibiotic therapy. He did not go to follow-up with Dr. Amalia Hailey of podiatry and I have again talked to him about the pros and cons of this. He does not want to consider a ray amputation of this time. He is aware of the risks of recurrence, migration etc. He started HBO today and tolerated this well. He can complete the Augmentin that I gave him last week. I have looked over the lab work that Dr. Chana Bode ordered his C-reactive protein was 3.3 and his sedimentation rate was 17. The C-reactive protein is never really been measurably that high in this patient 12/15; not much change in the wound today however he has undermining along the lateral part of the foot again more extensively than last week. He has some rims of epithelialization. We have been using silver alginate. He is undergoing hyperbarics but did not dive today 12/18; in for his obligatory first total contact cast change. Unfortunately there was pus coming from the undermining area around his fifth metatarsal head. This was  cultured but will preclude reapplication of a cast. He is seen in conjunction with HBO 12/24; patient had staph lugdunensis in the wound in the undermining area laterally last time. We put him on doxycycline which should have covered this. The wound looks better today. I am going to give him another week of doxycycline before reattempting the total contact cast 12/31; the patient is completing  antibiotics. Hemorrhagic debris in the distal part of the wound with some undermining distally. He also had hyper granulation. Extensive debridement with a #5 curette. The infected area that was on the lateral part of the fifth met head is closed over. I do not think he needs any more antibiotics. Patient was seen prior to HBO. Preparations for a total contact cast were made in the cast will be placed post hyperbarics 04/11/19; once again the patient arrives today without complaint. He had been in a cast all week noted that he had heavy drainage this week. This resulted in large raised areas of macerated tissue around the wound 1/14; wound bed looks better slightly smaller. Hydrofera Blue has been changing himself. He had a heavy drainage last week which caused a lot of maceration around the wound so I took him out of a total contact cast he says the drainage is actually better this week He is seen today in conjunction with HBO 1/21; returns to clinic. He was up in [place] for a day or 2 attending a funeral. He comes back in with the wound larger and with a large area of exposed bone. He had osteomyelitis and septic arthritis of the fifth left metatarsal head while he was in hospital. He received IV antibiotics in the hospital for a prolonged period of time then 3 weeks of Augmentin. Subsequently I gave him 2 weeks of doxycycline for more superficial wound infection. When I saw this last week the wound was smaller the surface of the wound looks satisfactory. 1/28; patient missed hyperbarics today. Bone biopsy I did last time showed Enterococcus faecalis and Staphylococcus lugdunensis . He has a wide area of exposed bone. We are going to use silver alginate as of today. I had another ethical discussion with the patient. 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 In this situation I think the likelihood of healing this is low. Therefore I have recommended a ray  amputation and with the patient's agreement I have referred him to Dr. Doran Durand. The other issue is that his compliance with hyperbarics has been minimal because of his work schedule and given his underlying decision I am going to stop this today READMISSION 10/24/2019 MRI 09/29/2019 left foot IMPRESSION: 1. Apparent skin ulceration inferior and lateral to the 5th metatarsal base with underlying heterogeneous T2 signal and enhancement in the subcutaneous fat. Small peripherally enhancing fluid collections along the plantar and lateral aspects of the 5th metatarsal base suspicious for abscesses. 2. Interval amputation through the mid 5th metatarsal with nonspecific low-level marrow edema and enhancement. Given the proximity to the adjacent soft tissue inflammatory changes, osteomyelitis cannot be excluded. 3. The additional bones appear unremarkable. MRI 09/29/2019 right foot IMPRESSION: 1. Soft tissue ulceration lateral to the 5th MTP joint. There is low-level T2 hyperintensity within the 4th and 5th metatarsal heads and adjacent proximal phalanges without abnormal T1 signal or cortical destruction. These findings are nonspecific and could be seen with early marrow edema, hyperemia or early osteomyelitis. No evidence of septic joint. 2. Mild tenosynovitis and synovial enhancement associated with the extensor  digitorum tendons at the level of the midfoot. 3. Diffuse low-level muscular T2 hyperintensity and enhancement, most consistent with diabetic myopathy. LEFT FOOT BONE Methicillin resistant staphylococcus aureus Staphylococcus lugdunensis MIC MIC CIPROFLOXACIN >=8 RESISTANT Resistant <=0.5 SENSI... Sensitive CLINDAMYCIN <=0.25 SENS... Sensitive >=8 RESISTANT Resistant ERYTHROMYCIN >=8 RESISTANT Resistant >=8 RESISTANT Resistant GENTAMICIN <=0.5 SENSI... Sensitive <=0.5 SENSI... Sensitive Inducible Clindamycin NEGATIVE Sensitive NEGATIVE Sensitive OXACILLIN >=4 RESISTANT Resistant 2  SENSITIVE Sensitive RIFAMPIN <=0.5 SENSI... Sensitive <=0.5 SENSI... Sensitive TETRACYCLINE <=1 SENSITIVE Sensitive <=1 SENSITIVE Sensitive TRIMETH/SULFA <=10 SENSIT Sensitive <=10 SENSIT Sensitive ... Marland Kitchen.. VANCOMYCIN 1 SENSITIVE Sensitive <=0.5 SENSI... Sensitive Right foot bone . Component 3 wk ago Specimen Description BONE Special Requests RIGHT 4 METATARSAL SAMPLE B Gram Stain NO WBC SEEN NO ORGANISMS SEEN Culture RARE METHICILLIN RESISTANT STAPHYLOCOCCUS AUREUS NO ANAEROBES ISOLATED Performed at [facility], [place] [address], [place], [place] [id number] Report Status 10/08/2019 FINAL Organism ID, Bacteria METHICILLIN RESISTANT STAPHYLOCOCCUS AUREUS Resulting Agency [facility] Susceptibility Methicillin resistant staphylococcus aureus MIC CIPROFLOXACIN >=8 RESISTANT Resistant CLINDAMYCIN <=0.25 SENS... Sensitive ERYTHROMYCIN >=8 RESISTANT Resistant GENTAMICIN <=0.5 SENSI... Sensitive Inducible Clindamycin NEGATIVE Sensitive OXACILLIN >=4 RESISTANT Resistant RIFAMPIN <=0.5 SENSI... Sensitive TETRACYCLINE <=1 SENSITIVE Sensitive TRIMETH/SULFA <=10 SENSIT Sensitive ... VANCOMYCIN 1 SENSITIVE Sensitive This is a patient we had in clinic earlier this year with a wound over his left fifth metatarsal head. 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 Doran Durand on 05/16/2019. He seems to have developed problems with wounds on his bilateral feet in June 2021 although he may have had problems earlier than this. He was in an urgent care with a right foot ulcer on 09/26/2019 and given a course of doxycycline. This was apparently after having trouble getting into see orthopedics. He was seen by podiatry  on 09/28/2019 noted to have bilateral lower extremity ulcers including the left lateral fifth metatarsal base and the right subfifth met head. It was noted that had purulent drainage at that time. He required hospitalization from 6/20 through 7/2. This was because of worsening right foot wounds. He underwent bilateral operative incision and drainage and bone biopsies bilaterally. Culture results are listed above. He has been referred back to clinic by Dr. Jacqualyn Posey of podiatry. He is also followed by Dr. Megan Salon who saw him yesterday. He was discharged from hospital on Zyvox Flagyl and Levaquin and yesterday changed to doxycycline Flagyl and Levaquin. His inflammatory markers on 6/26 showed a sedimentation rate of 129 and a C-reactive protein of 5. This is improved to 14 and 1.3 respectively. This would indicate improvement. ABIs in our clinic today were 1.23 on the right and 1.20 on the left 11/01/2019 on evaluation today patient appears to be doing fairly well in regard to the wounds on his feet at this point. Fortunately there is no signs of active infection at this time. No fevers, chills, nausea, vomiting, or diarrhea. He currently is seeing infectious disease and still under their care at this point. Subsequently he also has both wounds which she has not been using collagen on as he did not receive that in his packaging he did not call us and let us know that. Apparently that just was missed on the order. Nonetheless we will get that straightened out today. 8/9-Patient  returns for bilateral foot wounds, using Prisma with hydrogel moistened dressings, and the wounds appear stable. Patient using surgical shoes, avoiding much pressure or weightbearing as much as possible 8/16; patient has bilateral foot wounds. 1 on the right lateral foot proximally the other is on the left mid lateral foot. Both required debridement of callus and thick skin around the wounds. We have been using silver collagen 8/27;  patient has bilateral lateral foot wounds. The area on the left substantially surrounded by callus and dry skin. This was removed from the wound edge. The underlying wound is small. The area on the right measured somewhat smaller today. 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 Unfortunately I did not update his antibiotics during today's visit. 9/10 I reviewed Dr. Hale Bogus last notes he felt he had completed antibiotics his inflammatory markers were reasonably well controlled. He has a small wound on the lateral left foot and a tiny area on the right which is just above closed. He is using Hydrofera Blue with border foam he has bilateral surgical shoes 9/24; 2 week f/u. doing well. right foot is closed. left foot still undermined. 10/14; right foot remains closed at the fifth met head. The area over the base of the left fifth metatarsal has a small open area but considerable undermining towards the plantar foot. Thick callus skin around this suggests an adequate pressure relief. We have talked about this. He says he is going to go back into his cam boot. I suggested a total contact cast he did not seem enamored with this suggestion 10/26; left foot base of the fifth metatarsal. Same condition as last time. He has skin over the area with an open wound however the skin is not adherent. He went to see Dr. Earleen Newport who did an x-ray and culture of his foot I have not reviewed the x-ray but the patient was not told anything. He is on doxycycline 11/11; since the patient was last here he was in the emergency room on 10/30 he was concerned about swelling in the left foot. They did not do any cultures or x-rays. They changed his antibiotics to cephalexin. Previous culture showed group B strep. The cephalexin is appropriate as doxycycline has less than predictable coverage. Arrives in clinic today with swelling over this area under the wound.  He also has a new wound on the right fifth metatarsal head 11/18; the patient has a difficult wound on the lateral aspect of the left fifth metatarsal head. The wound was almost ballotable last week I opened it slightly expecting to see purulence however there was just bleeding. I cultured this this was negative. X-ray unchanged. We are trying to get an MRI but I am not sure were going to be able to get this through his insurance. He also has an area on the right lateral fifth metatarsal head this looks healthier 12/3; the patient finally got our MRI. Surprisingly this did not show osteomyelitis. I did show the soft tissue ulceration at the lateral plantar aspect of the fifth metatarsal base with a tiny residual 6 mm abscess overlying the superficial fascia I have tried to culture this area I have not been able to get this to grow anything. Nevertheless the protruding tissue looks aggravated. I suspect we should try to treat the underlying "abscess with broad-spectrum antibiotics. I am going to start him on Levaquin and Flagyl. He has much less edema in his legs and I  am going to continue to wrap his legs and see him weekly 12/10. I started Levaquin and Flagyl on him last week. He just picked up the Flagyl apparently there was some delay. The worry is the wound on the left fifth metatarsal base which is substantial and worsening. His foot looks like he inverts at the ankle making this a weightbearing surface. Certainly no improvement in fact I think the measurements of this are somewhat worse. We have been using 12/17; he apparently just got the Levaquin yesterday this is 2 weeks after the fact. He has completed the Flagyl. The area over the left fifth metatarsal base still has protruding granulation tissue although it does not look quite as bad as it did some weeks ago. He has severe bilateral lymphedema although we have not been treating him for wounds on his legs this is definitely going to require  compression. There was so much edema in the left I did not wish to put him in a total contact cast today. I am going to increase his compression from 3-4 layer. The area on the right lateral fifth met head actually look quite good and superficial. 12/23; patient arrived with callus on the right fifth met head and the substantial hyper granulated callused wound on the base of his fifth metatarsal. He says he is completing his Levaquin in 2 days but I do not think that adds up with what I gave him but I will have to double check this. We are using Hydrofera Blue on both areas. My plan is to put the left leg in a cast the week after New Year's 04/06/2020; patient's wounds about the same. Right lateral fifth metatarsal head and left lateral foot over the base of the fifth metatarsal. There is undermining on the left lateral foot which I removed before application of total contact cast continuing with Hydrofera Blue new. Patient tells me he was seen by endocrinology today lab work was done [Dr. Kerr]. Also wondering whether he was referred to cardiology. I went over some lab work from previously does not have chronic renal failure certainly not nephrotic range proteinuria he does have very poorly controlled diabetes but this is not his most updated lab work. Hemoglobin A1c has been over 11 1/10; the patient had a considerable amount of leakage towards mid part of his left foot with macerated skin however the wound surface looks better the area on the right lateral fifth met head is better as well. I am going to change the dressing on the left foot under the total contact cast to silver alginate, continue with Hydrofera Blue on the right. 1/20; patient was in the total contact cast for 10 days. Considerable amount of drainage although the skin around the wound does not look too bad on the left foot. The area on the right fifth metatarsal head is closed. Our nursing staff reports large amount of drainage out  of the left lateral foot wound 1/25; continues with copious amounts of drainage described by our intake staff. PCR culture I did last week showed E. coli and Enterococcus faecalis and low quantities. Multiple resistance genes documented including extended spectrum beta lactamase, MRSA, MRSE, quinolone, tetracycline. The wound is not quite as good this week as it was 5 days ago but about the same size 2/3; continues with copious amounts of malodorous drainage per our intake nurse. The PCR culture I did 2 weeks ago showed E. coli and low quantities of Enterococcus. There were multiple resistance genes  detected. I put Neosporin on him last week although this does not seem to have helped. The wound is slightly deeper today. Offloading continues to be an issue here although with the amount of drainage she has a total contact cast is just not going to work 2/10; moderate amount of drainage. Patient reports he cannot get his stocking on over the dressing. I told him we have to do that the nurse gave him suggestions on how to make this work. The wound is on the bottom and lateral part of his left foot. Is cultured predominantly grew low amounts of Enterococcus, E. coli and anaerobes. There were multiple resistance genes detected including extended spectrum beta lactamase, quinolone, tetracycline. I could not think of an easy oral combination to address this so for now I am going to do topical antibiotics provided by [facility] I think the main agents here are vancomycin and an aminoglycoside. We have to be able to give him access to the wounds to get the topical antibiotic on 2/17; moderate amount of drainage this is unchanged. He has his Keystone topical antibiotic against the deep tissue culture organisms. He has been using this and changing the dressing daily. Silver alginate on the wound surface. 2/24; using Keystone antibiotic with silver alginate on the top. He had too much drainage for a total contact  cast at one point although I think that is improving and I think in the next week or 2 it might be possible to replace a total contact cast I did not do this today. In general the wound surface looks healthy however he continues to have thick rims of skin and subcutaneous tissue around the wide area of the circumference which I debrided 06/04/2020 upon evaluation today patient appears to be doing well in regard to his wound. I do feel like he is showing signs of improvement. There is little bit of callus and dead tissue around the edges of the wound as well as what appears to be a little bit of a sinus tract that is off to the side laterally I would perform debridement to clear that away today. 3/17; left lateral foot. The wound looks about the same as I remember. Not much depth surface looks healthy. No evidence of infection 3/25; left lateral foot. Wound surface looks about the same. Separating epithelium from the circumference. There really is no evidence of infection here however not making progress by my view 3/29; left lateral foot. Surface of the wound again looks reasonably healthy still thick skin and subcutaneous tissue around the wound margins. There is no evidence of infection. One of the concerns being brought up by the nurses has again the amount of drainage vis--vis continued use of a total contact cast 4/5; left lateral foot at roughly the base of the fifth metatarsal. Nice healthy looking granulated tissue with rims of epithelialization. The overall wound measurements are not any better but the tissue looks healthy. The only concern is the amount of drainage although he has no surrounding maceration with what we have been doing recently to absorb fluid and protect his skin. He also has lymphedema. 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 He states that the kerlix/coban under the TCC rolled down and  could not get it back up. He has been using an offloading scooter and has somehow hurt his right foot using this device. This happened last week. He states that the side of his right foot developed a blister and opened. The top of his foot also has a few small open wounds he thinks is due to his socks rubbing in his shoes. He has not been using any dressings to the wound. He denies purulent drainage, fever/chills or erythema to the wounds. 4/22; patient presents for 1 week follow-up. He developed new wounds to the right foot that were evaluated at last clinic visit. He continues to have a total contact cast to the left leg and he reports no issues. He has been using silver collagen to the right foot wounds with no issues. He denies purulent drainage, fever/chills or erythema to the right foot wounds. He has no complaints today 4/25; patient presents for 1 week follow-up. He has a total contact cast of the left leg and reports no issues. He has been using silver alginate to the right foot wound. He denies purulent drainage, fever/chills or erythema to the right foot wounds. 5/2 patient presents for 1 week follow-up. T contact cast on the left. The wound which is on the base of the plantar foot at the base of the fifth metatarsal otal actually looks quite good and dimensions continue to gradually contract. HOWEVER the area on the right lateral fifth metatarsal head is much larger than what I remember from 2 weeks ago. Once more is he has significant levels of hypergranulation. Noteworthy that he had this same hyper granulated response on his wound on the left foot at one point in time. So much so that he I thought there was an underlying fluid collection. Based on this I think this just needs debridement. 5/9; the wound on the left actually  continues to be gradually smaller with a healthy surface. Slight amount of drainage and maceration of the skin around but not too bad. However he has a large wound over the right fifth metatarsal head very much in the same configuration as his left foot wound was initially. I used silver nitrate to address the hyper granulated tissue no mechanical debridement 5/16; area on the left foot did not look as healthy this week deeper thick surrounding macerated skin and subcutaneous tissue. oo The area on the right foot fifth met head was about the same oo The area on the right ankle that we identified last week is completely broken down into an open wound presumably a stocking rubbing issue 5/23; patient has been using a total contact cast to the left side. He has been using silver alginate underneath. He has also been using silver alginate to the right foot wounds. He has no complaints today. He denies any signs of infection. 5/31; the left-sided wound looks some better measure smaller surface granulation looks better. We have been using silver alginate under the total contact cast oo The large area on his right fifth met head and right dorsal foot look about the same still using silver alginate 6/6; neither side is good as I was hoping although the surface area dimensions are better. A lot of maceration on his left and right foot around the wound edge. Area on the dorsal right foot looks better. He says he was traveling. I am not sure what does the amount of maceration around the plantar wounds may be drainage issues 6/13; in general the wound surfaces look quite  good on both sides. Macerated skin and raised edges around the wound required debridement although in general especially on the left the surface area seems improved. oo The area on the right dorsal ankle is about the same I thought this would not be such a problem to close 6/20; not much change in either wound although the one on the right  looks a little better. Both wounds have thick macerated edges to the skin requiring debridements. We have been using silver alginate. The area on the dorsal right ankle is still open I thought this would be closed. 6/28; patient comes in today with a marked deterioration in the right foot wound fifth met head. Wide area of exposed bone this is a drastic change from last time. The area on the left there we have been casting is stagnant. We have been using silver alginate in both wound areas. 7/5; bone culture I did for PCR last time was positive for Pseudomonas, group B strep, Enterococcus and Staph aureus. There was no suggestion of methicillin resistance or ampicillin resistant genes. This was resistant to tetracycline however He comes into the clinic today with the area over his right plantar fifth metatarsal head which had been doing so well 2 weeks ago completely necrotic feeling bone. I do not know that this is going to be salvageable. The left foot wound is certainly no smaller but it has a better surface and is superficial. 7/8; patient called in this morning to say that his total contact cast was rubbing against his foot. He states he is doing fine overall. He denies signs of infection. 7/12; continued deterioration in the wound over the right fifth metatarsal head crumbling bone. This is not going to be salvageable. The patient agrees and wants to be referred to Dr. Doran Durand which we will attempt to arrange as soon as possible. I am going to continue him on antibiotics as long as that takes so I will renew those today. The area on the left foot which is the base of the fifth metatarsal continues to look somewhat better. Healthy looking tissue no depth no debridement is necessary here. 7/20; the patient was kindly seen by Dr. Doran Durand of orthopedics on 10/19/2020. He agreed that he needed a ray amputation on the right and he said he would have a look at the fourth as well while he was  intraoperative. Towards this end we have taken him out of the total contact cast on the left we will put him in a wrap with Hydrofera Blue. As I understand things surgery is planned for 7/21 7/27; patient had his surgery last Thursday. 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 Apparently everything went well we did not still disturb that today The area on the left foot actually looks quite good. He has been much less mobile which probably explains this he did not seem to do well in the total contact cast secondary to drainage and maceration I think. 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 Fortunately there is no evidence of active infection at this time. No fevers, chills, nausea, vomiting, or diarrhea. Overall I think that he is actually doing extremely well. Nonetheless I do believe that he is staying off of this more following the surgery in his right foot that is the reason the left is doing so great. 8/16; left plantar foot wound. This looks smaller than  the last time I saw this he is using Hydrofera Blue. The surgical wound on the right foot is being followed by Dr. Doran Durand we did not look at this today. He has surgical shoes on both feet 8/23; left plantar foot wound not as good this week. Surrounding macerated skin and subcutaneous tissue everything looks moist and wet. I do not think he is offloading this adequately. He is using a surgical shoe Apparently the right foot surgical wound is not open although I did not check his foot 8/31; left plantar foot lateral aspect. Much improved this week. He has no maceration. Some improvement in the surface area of the wound but most impressively the depth is come in we are using silver alginate. The patient is a Product/process development scientist. He is asked that we write him a letter so he can go back to work. I have also tried to see if we can write  something that will allow him to limit the amount of time that he is on his foot at work. 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 Hopefully the latter can be avoided 9/6; I believe the patient missed an appointment last week. He arrives in today with a wound looking roughly the same certainly no better. Undermining laterally and also inferiorly. We used molecuLight today in training with the patient's permission.. We are using silver alginate 9/21 wound is measuring bigger this week although this may have to do with the aggressive circumferential debridement last week in response to the blush fluorescence on the MolecuLight. Culture I did last week showed significant MSSA and E. coli. I put him on Augmentin but he has not started it yet. We are also going to send this for compounded antibiotics at [facility]. There is no evidence of systemic infection 9/29; silver alginate. His Keystone arrived. He is completing Augmentin in 2 days. Offloading in a cam boot. Moderate drainage per our intake staff 10/5; using silver alginate. He has been using his [place]. He has completed his Augmentin. Per our intake nurse still a lot of drainage, far too much to consider a total contact cast. Wound measures about the same. He had the same undermining area that I defined last week from a roughly 11-3. I remove this today 10/12; using silver alginate he is using the [place]. He comes in for a nurse visit hence we are applying [employer] twice a week. Measuring slightly better today and less notable drainage. Extensive debridement of the wound edge last time 10/18; using topical Keystone and silver alginate and a soft cast. Wound measurements about the same. Drainage was through his soft cast. We are changing this twice a week Tuesdays and Friday 10/25; comes in with moderate drainage. Still using Keystone silver alginate and a soft cast.  Wound dimensions completely the same.He has a lot of edema in the left leg he has lymphedema. Asking for [name] to consider wrapping him as he cannot get his stocking on over the soft cast 11/2; comes in with moderate to large drainage slightly smaller in terms of width we have been using [place]. His wound looks satisfactory but not much improvement 11/4; patient presents today for obligatory cast change. Has no issues or complaints today. He denies signs of infection. 11/9; patient traveled this weekend to [place], was on the cast quite a bit. Staining of the cast with black material from his walking boot. Drainage was  not quite as bad as we feared. Using silver alginate and Keystone 11/16; we do not have size for cast therefore we have been putting a soft cast on him since the change on Friday. Still a significant amount of drainage necessitating changing twice a week. We have been using the Keystone at cast changes either hard or soft as well as silver alginate Comes in the clinic with things actually looking fairly good improvement in width. He says his offloading is about the same 02/24/2021 upon evaluation today patient actually comes back in and is doing excellent in regard to his foot ulcer this is significantly smaller even compared to the last visit. The soft cast seems to have done extremely well for him which is great news. I do not see any signs of infection minimal debridement will be needed today. 11/30; left lateral foot much improved half a centimeter improvement in surface area. No evidence of infection. He seems to be doing better with the soft cast in the TCC therefore we will continue with this. He comes back in later in the week for a change with the nurses. This is due to drainage 12/6; no improvement in dimensions. Under illumination some debris on the surface we have been using silver alginate, soft cast. If there is anything optimistic here he seems to have have less  drainage 12/13. Dimensions are improved both length and width and slightly in depth. Appears to be quite healthy today. Raised edges of this thick skin and callus around the edges however. He is in a soft cast were bringing him back once for a change on Friday. Drainage is better 12/20. Dimensions are improved. He still has raised edges of thick skin and callus around the edges. We are using a soft cast 12/28; comes in today with thick callus around the wound. Using silver under alginate under a soft cast. I do not think there is much improvement in any measurement 2023 04/06/2021; patient was put in a total contact cast. Unfortunately not much change in surface area 1/10; not much different still thick callus and skin around the edge in spite of the total contact cast. This was just debrided last week we have been using the [facility] compounded antibiotic and silver alginate under a total contact cast 1/18 the patient's wound on the left side is doing nicely. smaller HOWEVER he comes in today with a wound on the right foot laterally. blister most likely serosangquenous drainage 1/24; the patient continues to do well in terms of the plantar left foot which is continued to contract using silver alginate under the total contact cast HOWEVER the right lateral foot is bigger with denuded skin around the edges. I used pickups and a #15 scalpel to remove this this looks like the remanence of a large blister. Cannot rule out infection. Culture in this area I did last week showed Staphylococcus lugdunensis few colonies. I am going to try to address this with his [employer] antibiotic that is done so well on the left having linezolid and this should cover the staph 2/1; the patient's wound on his left foot which was the original plantar foot wound thick skin and eschar around the edges even in the total contact cast but the wound surface does not look too bad The real problem is on how his right lateral foot at  roughly the base of the fifth metatarsal. The wound is completely necrotic more worrisome than that there is swelling around the edges of this. We have  been using silver alginate on both wounds and Keystone on the right foot. Unfortunately I think he is going to require systemic antibiotics while we await cultures. He did not get the x-ray done that we ordered last week [lost the prescription 2/7; disappointingly in the area on the left foot which we are treating with a total contact cast is still not closed although it is much smaller. He continues to have a lot of callus around the wound edge. -Right lateral foot culture I did last week was negative x-ray also negative for osteomyelitis. Objective Constitutional Sitting or standing Blood Pressure is within target range for patient.. Pulse regular and within target range for patient.Marland Kitchen Respirations regular, non-labored and within target range.. Temperature is normal and within the target range for the patient.Marland Kitchen Appears in no distress. Vitals Time Taken: 3:45 PM, Height: 77 in, Weight: 280 lbs, BMI: 33.2, Temperature: 98.4 F, Pulse: 88 bpm, Respiratory Rate: 17 breaths/min, Blood Pressure: 130/80 mmHg, Capillary Blood Glucose: 120 mg/dl. General Notes: Wound exam; his left foot looks about the same. Thick surrounding eschar which I remove each week with a #3 curette still using silver alginate under a total contact cast but I will change the dressing next week if this is not any better oo Right lateral foot wound looks as though it has a better surface. I removed some easily removed eschar again with a #3 curette. minimal bleeding Integumentary (Hair, Skin) Wound #3 status is Open. Original cause of wound was Trauma. The date acquired was: 10/02/2019. The wound has been in treatment 80 weeks. The wound is located on the Left,Lateral Foot. The wound measures 0.3cm length x 0.5cm width x 0.3cm depth; 0.118cm^2 area and 0.035cm^3 volume. There is Fat  Layer (Subcutaneous Tissue) exposed. There is no tunneling or undermining noted. There is a medium amount of serosanguineous drainage noted. The wound margin is thickened. There is large (67-100%) pink granulation within the wound bed. There is a small (1-33%) amount of necrotic tissue within the wound bed including Adherent Slough. Wound #8 status is Open. Original cause of wound was Blister. The date acquired was: 04/18/2021. The wound has been in treatment 2 weeks. The wound is located on the Right,Lateral Foot. The wound measures 3.2cm length x 3cm width x 0.2cm depth; 7.54cm^2 area and 1.508cm^3 volume. There is Fat Layer (Subcutaneous Tissue) exposed. There is no tunneling noted, however, there is undermining starting at 3:00 and ending at 12:00 with a maximum distance of 1cm. There is a medium amount of serosanguineous drainage noted. The wound margin is well defined and not attached to the wound base. There is medium (34-66%) pink granulation within the wound bed. There is a medium (34-66%) amount of necrotic tissue within the wound bed including Eschar and Adherent Slough. 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 There was a Selective/Open Wound Skin/Epidermis Debridement with a total area of 0.15 sq cm performed by [name]., MD. With the following instrument(s): Curette to remove Non-Viable tissue/material. Material removed includes Callus and Skin: Epidermis and. No specimens were taken. A time out was conducted at 16:12, prior to the start of the procedure. A  Minimum amount of  bleeding was controlled with Pressure. The procedure was tolerated well with a pain level of 0 throughout and a pain level of 0 following the procedure. Post Debridement Measurements: 0.3cm length x 0.5cm width x 0.3cm depth; 0.035cm^3 volume. Character of Wound/Ulcer Post Debridement is improved. Severity of Tissue Post Debridement is: Fat layer exposed. Post procedure Diagnosis Wound #3: Same as Pre-Procedure Pre-procedure diagnosis of Wound #3 is a Diabetic Wound/Ulcer of the Lower Extremity located on the Left,Lateral Foot . There was a T Contact Cast otal Procedure by [name]., MD. 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 There was a Selective/Open Wound Non-Viable Tissue Debridement with a total area of 1 sq cm performed by [name]., MD. With the following instrument(s): Curette to remove Non-Viable tissue/material. Material removed includes Eschar and Slough and. No specimens were taken. A time out was conducted at 16:12, prior to the start of the procedure. A Moderate amount of bleeding was controlled with Pressure. The procedure was tolerated well with a pain level of 0 throughout and a pain level of 0 following the procedure. Post Debridement Measurements: 3.2cm length x 3cm width x 0.2cm depth; 1.508cm^3 volume. Character of Wound/Ulcer Post Debridement is improved. Severity of Tissue Post Debridement is: Fat layer exposed. Post procedure Diagnosis Wound #8: Same as Pre-Procedure Plan Follow-up Appointments: Return Appointment in 1 week. - Dr. Dellia Nims Bathing/ Shower/ Hygiene: May shower with protection but do not get wound dressing(s) wet. - Use Cast Protector Bag Edema Control - Lymphedema / SCD / Other: Elevate legs to the level of the heart or above for 30  minutes daily and/or when sitting, a frequency of: - throughout the day Avoid standing for long periods of time. Exercise regularly Moisturize legs daily. - right leg every night before bed. Compression stocking or Garment 20-30 mm/Hg pressure to: - Apply to right leg in the morning and remove at night. Off-Loading: T Contact Cast to Left Lower Extremity otal Open toe surgical shoe to: - right foot Other: - minimal weight bearing left foot Additional Orders / Instructions: Follow Nutritious Diet WOUND #3: - Foot Wound Laterality: Left, Lateral Cleanser: Soap and Water 1 x Per Week/7 Days Discharge Instructions: May shower and wash wound with dial antibacterial soap and water prior to dressing change. Topical: keystone 1 x Per Week/7 Days Prim Dressing: KerraCel Ag Gelling Fiber Dressing, 4x5 in (silver alginate) 1 x Per Week/7 Days ary Discharge Instructions: Apply silver alginate to wound bed as instructed Secondary Dressing: Woven Gauze Sponge, Non-Sterile 4x4 in 1 x Per Week/7 Days Discharge Instructions: Apply over primary dressing as directed. Secondary Dressing: Optifoam Non-Adhesive Dressing, 4x4 in 1 x Per Week/7 Days Discharge Instructions: Apply over primary dressing as directed. Com pression Wrap: Kerlix Roll 4.5x3.1 (in/yd) 1 x Per Week/7 Days Discharge Instructions: Apply Kerlix and Coban compression as directed. Com pression Wrap: Coban Self-Adherent Wrap 4x5 (in/yd) 1 x Per Week/7 Days Discharge Instructions: Apply over Kerlix as directed. WOUND #8: - Foot Wound Laterality: Right, Lateral Cleanser: Soap and Water Every Other Day/15 Days Discharge Instructions: May shower and wash wound with dial antibacterial soap and water prior to dressing change. Topical: keystone Every Other Day/15 Days Prim Dressing: KerraCel Ag Gelling Fiber Dressing, 4x5 in (silver alginate) (DME) (Generic) Every Other Day/15 Days ary Discharge  Instructions: Apply silver alginate to wound bed as  instructed Secondary Dressing: Woven Gauze Sponge, Non-Sterile 4x4 in (DME) (Generic) Every Other Day/15 Days Discharge Instructions: Apply over primary dressing as directed. Secondary Dressing: Optifoam Non-Adhesive Dressing, 4x4 in (DME) (Generic) Every Other Day/15 Days Discharge Instructions: Apply over primary dressing as directed. Secured With: Coban Self-Adherent Wrap 4x5 (in/yd) (DME) (Generic) Every Other Day/15 Days Discharge Instructions: Secure with Coban as directed. Secured With: [employer], 4.5x3.1 (in/yd) (DME) (Generic) Every Other Day/15 Days Discharge Instructions: Secure with Kerlix as directed. Secured With: 44M Medipore H Soft Cloth Surgical T ape, 4 x 10 (in/yd) (DME) (Generic) Every Other Day/15 Days Discharge Instructions: Secure with tape as directed. 1. I am still using silver alginate although I may change this next week 2. The wound on the plantar foot on the left which was his original wound I pared off surrounding callus dry skin. If this does not close likely change to moistened silver collagen next week. 3. Culture I did of the right lateral foot and the x-ray were negative. I am going to follow this for the next 2 weeks or so and decide whether any more detailed imaging is necessary. No empiric antibiotics for now Electronic Signature(s) Signed: 05/11/2021 5:02:53 PM By: [name] MD Entered By: [name] on 05/11/2021 16:57:37 -------------------------------------------------------------------------------- Total Contact Cast Details Patient Name: Date of Service: [name], [name] 05/11/2021 4:00 PM Medical Record Number: [id number] Patient Account Number: [id number] Date of Birth/Sex: Treating RN: 1986-03-13 (36 y.o. [name] Primary Care Provider: [name] Other Clinician: Referring Provider: Treating Provider/Extender: [name] in Treatment: 57 T Contact Cast Applied for Wound  Assessment: otal Wound #3 Left,Lateral Foot Performed By: Physician [name]., MD Post Procedure Diagnosis Same as Pre-procedure Electronic Signature(s) Signed: 05/11/2021 5:02:53 PM By: [name] MD Entered By: [name] on 05/11/2021 16:52:28 -------------------------------------------------------------------------------- SuperBill Details Patient Name: Date of Service: [name], [name] 05/11/2021 Medical Record Number: [id number] Patient Account Number: [id number] Date of Birth/Sex: Treating RN: 1985/04/21 (36 y.o. 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 foot with other specified severity Facility Procedures CPT4 Code: [id number] Description: [phone] - DEBRIDE WOUND 1ST 20 SQ CM OR < ICD-10 Diagnosis Description L97.528 Non-pressure chronic ulcer of other part of left foot with other specified severi L97.518 Non-pressure chronic ulcer of other part of right foot with other  specified sever Modifier: ty ity Quantity: 1 Physician Procedures : CPT4 Code Description Modifier [id number] - WC PHYS DEBR WO ANESTH 20 SQ CM ICD-10 Diagnosis Description L97.528 Non-pressure chronic ulcer of other part of left foot with other specified severity L97.518 Non-pressure chronic ulcer of other part of  right foot with other specified severity Quantity: 1 Electronic Signature(s) Signed: 05/11/2021 5:02:53 PM By: [name] MD Entered By: [name] on 05/11/2021 16:57:51

## 2021-05-12 NOTE — Progress Notes (Signed)
[name], Max ([id number]) Visit Report for 05/11/2021 Arrival Information Details Patient Name: Date of Service: Max [name] 05/11/2021 4:00 PM Medical Record Number: [id number] Patient Account Number: [id number] Date of Birth/Sex: Treating RN: July 15, 1985 (36 y.o. [name] Primary Care Provider: [name] Other Clinician: Referring Provider: Treating Provider/Extender: [name] in Treatment: 75 Visit Information History Since Last Visit Added or deleted any medications: No Patient Arrived: Ambulatory Any new allergies or adverse reactions: No Arrival Time: 15:43 Had a fall or experienced change in No Accompanied By: self activities of daily living that may affect Transfer Assistance: None risk of falls: Patient Identification Verified: Yes Signs or symptoms of abuse/neglect since last visito No Secondary Verification Process Completed: Yes Hospitalized since last visit: No Patient Requires Transmission-Based Precautions: No Implantable device outside of the clinic excluding No Patient Has Alerts: No cellular tissue based products placed in the center since last visit: Has Dressing in Place as Prescribed: Yes Pain Present Now: No Electronic Signature(s) Signed: 05/12/2021 2:07:25 PM By: Sandre Kitty Entered By: Sandre Kitty on 05/11/2021 15:45:05 -------------------------------------------------------------------------------- Encounter Discharge Information Details Patient Name: Date of Service: Max [name], Max [name] 05/11/2021 4:00 PM Medical Record Number: [id number] Patient Account Number: [id number] Date of Birth/Sex: Treating RN: [birth year] (36 y.o. [name] Primary Care Provider: [name] Other Clinician: Referring Provider: Treating Provider/Extender: [name] in Treatment: 3 Encounter Discharge Information Items Post Procedure Vitals Discharge Condition: Stable Temperature  (F): 98.4 Ambulatory Status: Ambulatory Pulse (bpm): 88 Discharge Destination: Home Respiratory Rate (breaths/min): 17 Transportation: Private Auto Blood Pressure (mmHg): 130/80 Accompanied By: alone Schedule Follow-up Appointment: Yes Clinical Summary of Care: Patient Declined Electronic Signature(s) Signed: 05/11/2021 6:14:04 PM By: [name] RN, BSN Entered By: [name] on 05/11/2021 17:41:23 -------------------------------------------------------------------------------- Multi Wound Chart Details Patient Name: Date of Service: Max [name], Max [name] 05/11/2021 4:00 PM Medical Record Number: [id number] Patient Account Number: [id number] Date of Birth/Sex: Treating RN: June 23, 1985 (36 y.o. [name] Primary Care Provider: [name] Other Clinician: Referring Provider: Treating Provider/Extender: [name] in Treatment: 80 Vital Signs Height(in): 77 Capillary Blood Glucose(mg/dl): 120 Weight(lbs): 280 Pulse(bpm): 88 Body Mass Index(BMI): 33.2 Blood Pressure(mmHg): 130/80 Temperature(F): 98.4 Respiratory Rate(breaths/min): 17 Photos: [N/A:N/A] Left, Lateral Foot Right, Lateral Foot N/A Wound Location: Trauma Blister N/A Wounding Event: Diabetic Wound/Ulcer of the Lower Diabetic Wound/Ulcer of the Lower N/A Primary Etiology: Extremity Extremity Type II Diabetes Type II Diabetes N/A Comorbid History: 10/02/2019 04/18/2021 N/A Date Acquired: 80 2 N/A Weeks of Treatment: Open Open N/A Wound Status: No No N/A Wound Recurrence: 0.3x0.5x0.3 3.2x3x0.2 N/A Measurements L x W x [name] 0.118 7.54 N/A A (cm) : rea 0.035 1.508 N/A Volume (cm) : 92.80% -1270.90% N/A % Reduction in A rea: 78.80% -1270.90% N/A % Reduction in Volume: 3 Starting Position 1 (o'clock): 12 Ending Position 1 (o'clock): 1 Maximum Distance 1 (cm): No Yes N/A Undermining: Grade 2 Grade 2 N/A Classification: Medium Medium N/A Exudate A  mount: Serosanguineous Serosanguineous N/A Exudate Type: red, brown red, brown N/A Exudate Color: Thickened Well defined, not attached N/A Wound Margin: Large (67-100%) Medium (34-66%) N/A Granulation A mount: Pink Pink N/A Granulation Quality: Small (1-33%) Medium (34-66%) N/A Necrotic A mount: Adherent Slough Eschar, Adherent Slough N/A Necrotic Tissue: Fat Layer (Subcutaneous Tissue): Yes Fat Layer (Subcutaneous Tissue): Yes N/A Exposed Structures: Fascia: No Fascia: No Tendon: No Tendon: No Muscle: No Muscle: No Joint: No Joint: No Bone: No  Bone: No Medium (34-66%) None N/A Epithelialization: Debridement - Selective/Open Wound Debridement - Selective/Open Wound N/A Debridement: Pre-procedure Verification/Time Out 16:12 16:12 N/A Taken: Callus Necrotic/Eschar N/A Tissue Debrided: Skin/Epidermis Non-Viable Tissue N/A Level: 0.15 1 N/A Debridement A (sq cm): rea Curette Curette N/A Instrument: Minimum Moderate N/A Bleeding: Pressure Pressure N/A Hemostasis A chieved: 0 0 N/A Procedural Pain: 0 0 N/A Post Procedural Pain: Procedure was tolerated well Procedure was tolerated well N/A Debridement Treatment Response: 0.3x0.5x0.3 3.2x3x0.2 N/A Post Debridement Measurements L x W x [name] 0.035 1.508 N/A Post Debridement Volume: (cm) Debridement Debridement N/A Procedures Performed: T Contact Cast otal Treatment Notes Electronic Signature(s) Signed: 05/11/2021 5:02:53 PM By: [name] MD Signed: 05/11/2021 6:14:04 PM By: [name] RN, BSN Entered By: [name] on 05/11/2021 16:51:57 -------------------------------------------------------------------------------- Multi-Disciplinary Care Plan Details Patient Name: Date of Service: Max [name], Max [name] 05/11/2021 4:00 PM Medical Record Number: [id number] Patient Account Number: [id number] Date of Birth/Sex: Treating RN: [birth year] (36 y.o. [name] Primary Care Provider: [name] Other Clinician: Referring Provider: Treating Provider/Extender: [name] in Treatment: 20 Multidisciplinary Care Plan reviewed with physician Active Inactive Nutrition Nursing Diagnoses: Imbalanced nutrition Potential for alteratiion in Nutrition/Potential for imbalanced nutrition Goals: Patient/caregiver agrees to and verbalizes understanding of need to use nutritional supplements and/or vitamins as prescribed Date Initiated: 10/24/2019 Date Inactivated: 04/06/2020 Target Resolution Date: 04/03/2020 Goal Status: Met Patient/caregiver will maintain therapeutic glucose control Date Initiated: 10/24/2019 Target Resolution Date: 06/11/2021 Goal Status: Active Interventions: Assess HgA1c results as ordered upon admission and as needed Assess patient nutrition upon admission and as needed per policy Provide education on elevated blood sugars and impact on wound healing Provide education on nutrition Treatment Activities: Education provided on Nutrition : 12/02/2020 Notes: 11/17/20: Glucose control ongoing issue, target date extended. 01/26/21: Glucose management continues. Wound/Skin Impairment Nursing Diagnoses: Impaired tissue integrity Knowledge deficit related to ulceration/compromised skin integrity Goals: Patient/caregiver will verbalize understanding of skin care regimen Date Initiated: 10/24/2019 Target Resolution Date: 06/11/2021 Goal Status: Active Ulcer/skin breakdown will have a volume reduction of 30% by week 4 Date Initiated: 10/24/2019 Date Inactivated: 01/16/2020 Target Resolution Date: 01/10/2020 Unmet Reason: no change in Goal Status: Unmet measurements. Interventions: Assess patient/caregiver ability to obtain necessary supplies Assess patient/caregiver ability to perform ulcer/skin care regimen upon admission and as needed Assess ulceration(s) every visit Provide education on ulcer and skin care Notes: 11/17/20: Wound care  regimen continues Electronic Signature(s) Signed: 05/11/2021 6:14:04 PM By: [name] RN, BSN Entered By: [name] on 05/11/2021 16:47:19 -------------------------------------------------------------------------------- Pain Assessment Details Patient Name: Date of Service: Max [name], Max [name] 05/11/2021 4:00 PM Medical Record Number: [id number] Patient Account Number: [id number] Date of Birth/Sex: Treating RN: April 18, 1985 (36 y.o. [name] Primary Care Provider: [name] Other Clinician: Referring Provider: Treating Provider/Extender: [name] in Treatment: 80 Active Problems Location of Pain Severity and Description of Pain Patient Has Paino No Site Locations Pain Management and Medication Current Pain Management: Electronic Signature(s) Signed: 05/11/2021 6:14:04 PM By: [name] RN, BSN Signed: 05/12/2021 2:07:25 PM By: Sandre Kitty Entered By: Sandre Kitty on 05/11/2021 15:45:37 -------------------------------------------------------------------------------- Patient/Caregiver Education Details Patient Name: Date of Service: Max [name] 2/7/2023andnbsp4:00 PM Medical Record Number: [id number] Patient Account Number: [id number] Date of Birth/Gender: Treating RN: 12-Jul-1985 (36 y.o. [name] Primary Care Physician: [name] Other Clinician: Referring Physician: Treating Physician/Extender: [name] in Treatment: 52 Education Assessment Education Provided To: Patient  Education Topics Provided Wound/Skin Impairment: Methods: Explain/Verbal Responses: State content correctly Electronic Signature(s) Signed: 05/11/2021 6:14:04 PM By: [name] RN, BSN Entered By: [name] on 05/11/2021 16:47:42 -------------------------------------------------------------------------------- Wound Assessment Details Patient Name: Date of Service: Max [name], Max [name]  05/11/2021 4:00 PM Medical Record Number: [id number] Patient Account Number: [id number] Date of Birth/Sex: Treating RN: 1985/09/29 (36 y.o. [name] Primary Care Corliss Lamartina: [name] Other Clinician: Referring Deniah Saia: Treating Duane Trias/Extender: [name] in Treatment: 80 Wound Status Wound Number: 3 Primary Etiology: Diabetic Wound/Ulcer of the Lower Extremity Wound Location: Left, Lateral Foot Wound Status: Open Wounding Event: Trauma Comorbid History: Type II Diabetes Date Acquired: 10/02/2019 Weeks Of Treatment: 80 Clustered Wound: No Photos Wound Measurements Length: (cm) 0.3 Width: (cm) 0.5 Depth: (cm) 0.3 Area: (cm) 0.118 Volume: (cm) 0.035 % Reduction in Area: 92.8% % Reduction in Volume: 78.8% Epithelialization: Medium (34-66%) Tunneling: No Undermining: No Wound Description Classification: Grade 2 Wound Margin: Thickened Exudate Amount: Medium Exudate Type: Serosanguineous Exudate Color: red, brown Wound Bed Granulation Amount: Large (67-100%) Granulation Quality: Pink Necrotic Amount: Small (1-33%) Necrotic Quality: Adherent Slough Foul Odor After Cleansing: No Slough/Fibrino Yes Exposed Structure Fascia Exposed: No Fat Layer (Subcutaneous Tissue) Exposed: Yes Tendon Exposed: No Muscle Exposed: No Joint Exposed: No Bone Exposed: No Treatment Notes Wound #3 (Foot) Wound Laterality: Left, Lateral Cleanser Soap and Water Discharge Instruction: May shower and wash wound with dial antibacterial soap and water prior to dressing change. Peri-Wound Care Topical keystone Primary Dressing KerraCel Ag Gelling Fiber Dressing, 4x5 in (silver alginate) Discharge Instruction: Apply silver alginate to wound bed as instructed Secondary Dressing Woven Gauze Sponge, Non-Sterile 4x4 in Discharge Instruction: Apply over primary dressing as directed. Optifoam Non-Adhesive Dressing, 4x4 in Discharge Instruction: Apply over  primary dressing as directed. Secured With Compression Wrap Kerlix Roll 4.5x3.1 (in/yd) Discharge Instruction: Apply Kerlix and Coban compression as directed. Coban Self-Adherent Wrap 4x5 (in/yd) Discharge Instruction: Apply over Kerlix as directed. Compression Stockings Add-Ons Electronic Signature(s) Signed: 05/11/2021 6:14:04 PM By: [name] RN, BSN Signed: 05/11/2021 6:23:26 PM By: Dellie Catholic RN Entered By: Dellie Catholic on 05/11/2021 16:06:56 -------------------------------------------------------------------------------- Wound Assessment Details Patient Name: Date of Service: Max [name], Max [name] 05/11/2021 4:00 PM Medical Record Number: [id number] Patient Account Number: [id number] Date of Birth/Sex: Treating RN: 09-15-1985 (36 y.o. [name] Primary Care Shelitha Magley: [name] Other Clinician: Referring Nydia Ytuarte: Treating Micharl Helmes/Extender: [name] in Treatment: 80 Wound Status Wound Number: 8 Primary Etiology: Diabetic Wound/Ulcer of the Lower Extremity Wound Location: Right, Lateral Foot Wound Status: Open Wounding Event: Blister Comorbid History: Type II Diabetes Date Acquired: 04/18/2021 Weeks Of Treatment: 2 Clustered Wound: No Photos Wound Measurements Length: (cm) 3.2 Width: (cm) 3 Depth: (cm) 0.2 Area: (cm) 7.54 Volume: (cm) 1.508 % Reduction in Area: -1270.9% % Reduction in Volume: -1270.9% Epithelialization: None Tunneling: No Undermining: Yes Starting Position (o'clock): 3 Ending Position (o'clock): 12 Maximum Distance: (cm) 1 Wound Description Classification: Grade 2 Wound Margin: Well defined, not attached Exudate Amount: Medium Exudate Type: Serosanguineous Exudate Color: red, brown Foul Odor After Cleansing: No Slough/Fibrino Yes Wound Bed Granulation Amount: Medium (34-66%) Exposed Structure Granulation Quality: Pink Fascia Exposed: No Necrotic Amount: Medium (34-66%) Fat Layer  (Subcutaneous Tissue) Exposed: Yes Necrotic Quality: Eschar, Adherent Slough Tendon Exposed: No Muscle Exposed: No Joint Exposed: No Bone Exposed: No Treatment Notes Wound #8 (Foot) Wound Laterality: Right, Lateral Cleanser Soap and Water Discharge Instruction: May shower and wash wound with dial antibacterial  soap and water prior to dressing change. Peri-Wound Care Topical keystone Primary Dressing KerraCel Ag Gelling Fiber Dressing, 4x5 in (silver alginate) Discharge Instruction: Apply silver alginate to wound bed as instructed Secondary Dressing Woven Gauze Sponge, Non-Sterile 4x4 in Discharge Instruction: Apply over primary dressing as directed. Optifoam Non-Adhesive Dressing, 4x4 in Discharge Instruction: Apply over primary dressing as directed. Secured With [employer] 4x5 (in/yd) Discharge Instruction: Secure with Coban as directed. Kerlix Roll Sterile, 4.5x3.1 (in/yd) Discharge Instruction: Secure with Kerlix as directed. 90M Medipore H Soft Cloth Surgical T ape, 4 x 10 (in/yd) Discharge Instruction: Secure with tape as directed. Compression Wrap Compression Stockings Add-Ons Electronic Signature(s) Signed: 05/11/2021 6:14:04 PM By: [name] RN, BSN Signed: 05/11/2021 6:23:26 PM By: Dellie Catholic RN Entered By: Dellie Catholic on 05/11/2021 16:06:07 -------------------------------------------------------------------------------- Vitals Details Patient Name: Date of Service: Max [name], Max [name] 05/11/2021 4:00 PM Medical Record Number: [id number] Patient Account Number: [id number] Date of Birth/Sex: Treating RN: [birth year] (36 y.o. [name] Primary Care Provider: [name] Other Clinician: Referring Provider: Treating Provider/Extender: [name] in Treatment: 80 Vital Signs Time Taken: 15:45 Temperature (F): 98.4 Height (in): 77 Pulse (bpm): 88 Weight (lbs): 280 Respiratory Rate (breaths/min):  17 Body Mass Index (BMI): 33.2 Blood Pressure (mmHg): 130/80 Capillary Blood Glucose (mg/dl): 120 Reference Range: 80 - 120 mg / dl Electronic Signature(s) Signed: 05/12/2021 2:07:25 PM By: Sandre Kitty Entered By: Sandre Kitty on 05/11/2021 15:45:30

## 2021-05-19 ENCOUNTER — Encounter (HOSPITAL_BASED_OUTPATIENT_CLINIC_OR_DEPARTMENT_OTHER): Payer: BC Managed Care – PPO | Admitting: Internal Medicine

## 2021-05-19 ENCOUNTER — Other Ambulatory Visit: Payer: Self-pay

## 2021-05-19 DIAGNOSIS — E11621 Type 2 diabetes mellitus with foot ulcer: Secondary | ICD-10-CM | POA: Diagnosis not present

## 2021-05-19 NOTE — Progress Notes (Signed)
[name], [name] ([id number]) Visit Report for 05/19/2021 Debridement Details Patient Name: Date of Service: [name] 05/19/2021 8:30 A M Medical Record Number: [id number] Patient Account Number: [id number] Date of Birth/Sex: Treating RN: 12/09/1985 (36 y.o. M) Primary Care Provider: [name] Other Clinician: Referring Provider: Treating Provider/Extender: [name] in Treatment: 81 Debridement Performed for Assessment: Wound #3 Left,Lateral Foot Performed By: Physician [name], MD Debridement Type: Debridement Severity of Tissue Pre Debridement: Fat layer exposed Level of Consciousness (Pre-procedure): Awake and Alert Pre-procedure Verification/Time Out Yes - 09:00 Taken: Start Time: 09:00 T Area Debrided (L x W): otal 0.8 (cm) x 1 (cm) = 0.8 (cm) Tissue and other material debrided: Viable, Non-Viable, Callus, Subcutaneous, Skin: Epidermis Level: Skin/Subcutaneous Tissue Debridement Description: Excisional Instrument: Curette Bleeding: Minimum Hemostasis Achieved: Pressure Procedural Pain: 0 Post Procedural Pain: 0 Response to Treatment: Procedure was tolerated well Level of Consciousness (Post- Awake and Alert procedure): Post Debridement Measurements of Total Wound Length: (cm) 0.8 Width: (cm) 1 Depth: (cm) 0.2 Volume: (cm) 0.126 Character of Wound/Ulcer Post Debridement: Improved Severity of Tissue Post Debridement: Fat layer exposed Post Procedure Diagnosis Same as Pre-procedure Electronic Signature(s) Signed: 05/19/2021 4:36:32 PM By: [name] MD Entered By: [name] on 05/19/2021 09:40:25 -------------------------------------------------------------------------------- Debridement Details Patient Name: Date of Service: [name], [name] 05/19/2021 8:30 A M Medical Record Number: [id number] Patient Account Number: [id number] Date of Birth/Sex: Treating RN: [birth year] (36 y.o. M) Primary Care  Provider: [name] Other Clinician: Referring Provider: Treating Provider/Extender: [name] in Treatment: 81 Debridement Performed for Assessment: Wound #8 Right,Lateral Foot Performed By: Physician [name], MD Debridement Type: Debridement Severity of Tissue Pre Debridement: Fat layer exposed Level of Consciousness (Pre-procedure): Awake and Alert Pre-procedure Verification/Time Out Yes - 09:00 Taken: Start Time: 09:00 T Area Debrided (L x W): otal 3.2 (cm) x 2.8 (cm) = 8.96 (cm) Tissue and other material debrided: Viable, Non-Viable, Callus, Subcutaneous, Skin: Epidermis Level: Skin/Subcutaneous Tissue Debridement Description: Excisional Instrument: Curette Bleeding: Minimum Hemostasis Achieved: Pressure Procedural Pain: 0 Post Procedural Pain: 0 Response to Treatment: Procedure was tolerated well Level of Consciousness (Post- Awake and Alert procedure): Post Debridement Measurements of Total Wound Length: (cm) 3.2 Width: (cm) 3.2 Depth: (cm) 0.1 Volume: (cm) 0.804 Character of Wound/Ulcer Post Debridement: Improved Severity of Tissue Post Debridement: Fat layer exposed Post Procedure Diagnosis Same as Pre-procedure Electronic Signature(s) Signed: 05/19/2021 4:36:32 PM By: [name] MD Entered By: [name] on 05/19/2021 09:40:40 -------------------------------------------------------------------------------- HPI Details Patient Name: Date of Service: [name], [name] 05/19/2021 8:30 A M Medical Record Number: [id number] Patient Account Number: [id number] Date of Birth/Sex: Treating RN: December 26, 1985 (36 y.o. 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 He comes in for review of a wound over the plantar fifth metatarsal head extending into the lateral part of the foot. He was followed for this previously by his podiatrist Dr. Cornelius Moras. As the patient tells his story he went to see podiatry first for a swelling he developed on the lateral part of his fifth metatarsal head in May. He states this was "open" by podiatry and the area closed. He was followed up in June and it was again opened callus removed and it closed promptly. There were plans  being made for surgery on the fifth metatarsal head in June however his blood sugar was apparently too high for anesthesia. Apparently the area was debrided and opened again in June and it is never closed since. Looking over the records from podiatry I am really not able to follow this. It was clear when he was first seen it was before 5/14 at that point he already had a wound. By 5/17 the ulcer was resolved. I do not see anything about a procedure. On 5/28 noted to have pre-ulcerative moderate keratosis. X-ray noted 1/5 contracted toe and tailor's bunion and metatarsal deformity. On a visit date on 09/28/2018 the dorsal part of the left foot it healed and resolved. There was concern about swelling in his lower extremity he was sent to the ER.. As far as I can tell he was seen in the ER on 7/12 with an ulcer on his left foot. A DVT rule out of the left leg was negative. I do not think I have complete records from podiatry but I am not able to verify the procedures this patient states he had. He states after the last procedure the wound has never closed although I am not able to follow this in the records I have from podiatry. He has not had a recent x-ray The patient has been using Neosporin on the wound. He is wearing a Darco shoe. He is still very active up on his foot working and exercising. Past medical history; type 2 diabetes ketosis-prone, leg swelling with a negative DVT study in July. Non-smoker ABI  in our clinic was 0.85 on the left 10/16; substantial wound on the plantar left fifth met head extending laterally almost to the dorsal fifth MTP. We have been using silver alginate we gave him a Darco forefoot off loader. An x-ray did not show evidence of osteomyelitis did note soft tissue emphysema which I think was due to gas tracking through an open wound. There is no doubt in my mind he requires an MRI 10/23; MRI not booked until 3 November at the earliest this is largely due to his glucose sensor in the right arm. We have been using silver alginate. There has been an improvement 10/29; I am still not exactly sure when his MRI is booked for. He says it is the third but it is the 10th in epic. This definitely needs to be done. He is running a low-grade fever today but no other symptoms. No real improvement in the 1 02/26/2019 patient presents today for a follow-up visit here in our clinic he is last been seen in the clinic on October 29. Subsequently we were working on getting MRI to evaluate and see what exactly was going on and where we would need to go from the standpoint of whether or not he had osteomyelitis and again what treatments were going be required. Subsequently the patient ended up being admitted to the hospital on 02/07/2019 and was discharged on 02/14/2019. This is a somewhat interesting admission with a discharge diagnosis of pneumonia due to COVID-19 although he was positive for COVID-19 when tested at the urgent care but negative x2 when he was actually in the hospital. With that being said he did have acute respiratory failure with hypoxia and it was noted he also have a left foot ulceration with osteomyelitis. With that being said he did require oxygen for his pneumonia and I level 4 L. He was placed on antivirals and steroids for the COVID-19. He was also  transferred to the [place] at one point. Nonetheless he did subsequently discharged home and since being home  has done much better in that regard. The CT angiogram did not show any pulmonary embolism. With regard to the osteomyelitis the patient was placed on vancomycin and Zosyn while in the hospital but has been changed to Augmentin at discharge. It was also recommended that he follow- up with wound care and podiatry. Podiatry however wanted him to see [name] according to the patient prior to them doing anything further. His hemoglobin A1c was 9.9 as noted in the hospital. Have an MRI of the left foot performed while in the hospital on 02/04/2019. 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 There is an overlying plantar open wound noted an abscess tracking back along the lateral aspect of the fifth metatarsal shaft. There is otherwise diffuse cellulitis and mild fasciitis without findings of polymyositis. The patient did have recently pneumonia secondary to COVID-19 I looked in the chart through epic and it does appear that the patient may need to have an additional x-ray just to ensure everything is cleared and that he has no airspace disease prior to putting him into the [name]. 03/05/2019; patient was readmitted to the clinic last week. He was hospitalized twice for a viral upper respiratory tract infection from 11/1 through 11/4 and then 11/5 through 11/12 ultimately this turned out to be Covid pneumonitis. Although he was discharged on oxygen he is not using it. He says he feels fine. He has no exercise limitation no cough no sputum. His O2 sat in our clinic today was 100% on room air. 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 He received Vanco and Zosyn in the hospital and then was discharged on 2 weeks of Augmentin. I do not see any relevant cultures. He was supposed to follow-up with infectious  disease but I do not see that he has an appointment. 12/8; patient saw Dr. Novella Olive of infectious disease last week. He felt that he had had adequate antibiotic therapy. He did not go to follow-up with Dr. Amalia Hailey of podiatry and I have again talked to him about the pros and cons of this. He does not want to consider a ray amputation of this time. He is aware of the risks of recurrence, migration etc. He started HBO today and tolerated this well. He can complete the Augmentin that I gave him last week. I have looked over the lab work that Dr. Chana Bode ordered his C-reactive protein was 3.3 and his sedimentation rate was 17. The C-reactive protein is never really been measurably that high in this patient 12/15; not much change in the wound today however he has undermining along the lateral part of the foot again more extensively than last week. He has some rims of epithelialization. We have been using silver alginate. He is undergoing hyperbarics but did not dive today 12/18; in for his obligatory first total contact cast change. Unfortunately there was pus coming from the undermining area around his fifth metatarsal head. This was cultured but will preclude reapplication of a cast. He is seen in conjunction with HBO 12/24; patient had staph lugdunensis in the wound in the undermining area laterally last time. We put him on doxycycline which should have covered this. The wound looks better today. I  am going to give him another week of doxycycline before reattempting the total contact cast 12/31; the patient is completing antibiotics. Hemorrhagic debris in the distal part of the wound with some undermining distally. He also had hyper granulation. Extensive debridement with a #5 curette. The infected area that was on the lateral part of the fifth met head is closed over. I do not think he needs any more antibiotics. Patient was seen prior to HBO. Preparations for a total contact cast were made in the cast will  be placed post hyperbarics 04/11/19; once again the patient arrives today without complaint. He had been in a cast all week noted that he had heavy drainage this week. This resulted in large raised areas of macerated tissue around the wound 1/14; wound bed looks better slightly smaller. Hydrofera Blue has been changing himself. He had a heavy drainage last week which caused a lot of maceration around the wound so I took him out of a total contact cast he says the drainage is actually better this week He is seen today in conjunction with HBO 1/21; returns to clinic. He was up in [place] for a day or 2 attending a funeral. He comes back in with the wound larger and with a large area of exposed bone. He had osteomyelitis and septic arthritis of the fifth left metatarsal head while he was in hospital. He received IV antibiotics in the hospital for a prolonged period of time then 3 weeks of Augmentin. Subsequently I gave him 2 weeks of doxycycline for more superficial wound infection. When I saw this last week the wound was smaller the surface of the wound looks satisfactory. 1/28; patient missed hyperbarics today. Bone biopsy I did last time showed Enterococcus faecalis and Staphylococcus lugdunensis . He has a wide area of exposed bone. We are going to use silver alginate as of today. I had another ethical discussion with the patient. 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 In this situation I think the likelihood of healing this is low. Therefore I have recommended a ray amputation and with the patient's agreement I have referred him to Dr. Doran Durand. The other issue is that his compliance with hyperbarics has been minimal because of his work schedule and given his underlying decision I am going to stop this today READMISSION 10/24/2019 MRI 09/29/2019 left foot IMPRESSION: 1. Apparent skin ulceration inferior and lateral to the 5th metatarsal base with underlying  heterogeneous T2 signal and enhancement in the subcutaneous fat. Small peripherally enhancing fluid collections along the plantar and lateral aspects of the 5th metatarsal base suspicious for abscesses. 2. Interval amputation through the mid 5th metatarsal with nonspecific low-level marrow edema and enhancement. Given the proximity to the adjacent soft tissue inflammatory changes, osteomyelitis cannot be excluded. 3. The additional bones appear unremarkable. MRI 09/29/2019 right foot IMPRESSION: 1. Soft tissue ulceration lateral to the 5th MTP joint. There is low-level T2 hyperintensity within the 4th and 5th metatarsal heads and adjacent proximal phalanges without abnormal T1 signal or cortical destruction. These findings are nonspecific and could be seen with early marrow edema, hyperemia or early osteomyelitis. No evidence of septic joint. 2. Mild tenosynovitis and synovial enhancement associated with the extensor digitorum tendons at the level of the midfoot. 3. Diffuse low-level muscular T2 hyperintensity and enhancement, most consistent with diabetic myopathy. LEFT FOOT BONE Methicillin resistant staphylococcus aureus Staphylococcus lugdunensis MIC MIC CIPROFLOXACIN >=8 RESISTANT Resistant <=0.5 SENSI... Sensitive CLINDAMYCIN <=0.25 SENS... Sensitive >=8 RESISTANT Resistant  ERYTHROMYCIN >=8 RESISTANT Resistant >=8 RESISTANT Resistant GENTAMICIN <=0.5 SENSI... Sensitive <=0.5 SENSI... Sensitive Inducible Clindamycin NEGATIVE Sensitive NEGATIVE Sensitive OXACILLIN >=4 RESISTANT Resistant 2 SENSITIVE Sensitive RIFAMPIN <=0.5 SENSI... Sensitive <=0.5 SENSI... Sensitive TETRACYCLINE <=1 SENSITIVE Sensitive <=1 SENSITIVE Sensitive TRIMETH/SULFA <=10 SENSIT Sensitive <=10 SENSIT Sensitive ... Marland Kitchen.. VANCOMYCIN 1 SENSITIVE Sensitive <=0.5 SENSI... Sensitive Right foot bone . Component 3 wk ago Specimen Description BONE Special Requests RIGHT 4 METATARSAL SAMPLE B Gram Stain NO WBC  SEEN NO ORGANISMS SEEN Culture RARE METHICILLIN RESISTANT STAPHYLOCOCCUS AUREUS NO ANAEROBES ISOLATED Performed at [facility], [address], [place], [place] [address] Report Status 10/08/2019 FINAL Organism ID, Bacteria METHICILLIN RESISTANT STAPHYLOCOCCUS AUREUS Resulting Agency [facility] Susceptibility Methicillin resistant staphylococcus aureus MIC CIPROFLOXACIN >=8 RESISTANT Resistant CLINDAMYCIN <=0.25 SENS... Sensitive ERYTHROMYCIN >=8 RESISTANT Resistant GENTAMICIN <=0.5 SENSI... Sensitive Inducible Clindamycin NEGATIVE Sensitive OXACILLIN >=4 RESISTANT Resistant RIFAMPIN <=0.5 SENSI... Sensitive TETRACYCLINE <=1 SENSITIVE Sensitive TRIMETH/SULFA <=10 SENSIT Sensitive ... VANCOMYCIN 1 SENSITIVE Sensitive This is a patient we had in clinic earlier this year with a wound over his left fifth metatarsal head. 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 Doran Durand on 05/16/2019. He seems to have developed problems with wounds on his bilateral feet in June 2021 although he may have had problems earlier than this. He was in an urgent care with a right foot ulcer on 09/26/2019 and given a course of doxycycline. This was apparently after having trouble getting into see orthopedics. He was seen by podiatry on 09/28/2019 noted to have bilateral lower extremity ulcers including the left lateral fifth metatarsal base and the right subfifth met head. It was noted that had purulent drainage at that time. He required hospitalization from 6/20 through 7/2. This was because of worsening right foot wounds. He underwent bilateral operative incision and drainage and bone biopsies bilaterally. Culture results are  listed above. He has been referred back to clinic by Dr. Jacqualyn Posey of podiatry. He is also followed by Dr. Megan Salon who saw him yesterday. He was discharged from hospital on Zyvox Flagyl and Levaquin and yesterday changed to doxycycline Flagyl and Levaquin. His inflammatory markers on 6/26 showed a sedimentation rate of 129 and a C-reactive protein of 5. This is improved to 14 and 1.3 respectively. This would indicate improvement. ABIs in our clinic today were 1.23 on the right and 1.20 on the left 11/01/2019 on evaluation today patient appears to be doing fairly well in regard to the wounds on his feet at this point. Fortunately there is no signs of active infection at this time. No fevers, chills, nausea, vomiting, or diarrhea. He currently is seeing infectious disease and still under their care at this point. Subsequently he also has both wounds which she has not been using collagen on as he did not receive that in his packaging he did not call us and let us know that. Apparently that just was missed on the order. Nonetheless we will get that straightened out today. 8/9-Patient returns for bilateral foot wounds, using Prisma with hydrogel moistened dressings, and the wounds appear stable. Patient using surgical shoes, avoiding much pressure or weightbearing as much as possible 8/16; patient has bilateral foot wounds. 1 on the right lateral foot proximally the other is on  the left mid lateral foot. Both required debridement of callus and thick skin around the wounds. We have been using silver collagen 8/27; patient has bilateral lateral foot wounds. The area on the left substantially surrounded by callus and dry skin. This was removed from the wound edge. The underlying wound is small. The area on the right measured somewhat smaller today. 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 Unfortunately I did not update his antibiotics during  today's visit. 9/10 I reviewed Dr. Hale Bogus last notes he felt he had completed antibiotics his inflammatory markers were reasonably well controlled. He has a small wound on the lateral left foot and a tiny area on the right which is just above closed. He is using Hydrofera Blue with border foam he has bilateral surgical shoes 9/24; 2 week f/u. doing well. right foot is closed. left foot still undermined. 10/14; right foot remains closed at the fifth met head. The area over the base of the left fifth metatarsal has a small open area but considerable undermining towards the plantar foot. Thick callus skin around this suggests an adequate pressure relief. We have talked about this. He says he is going to go back into his cam boot. I suggested a total contact cast he did not seem enamored with this suggestion 10/26; left foot base of the fifth metatarsal. Same condition as last time. He has skin over the area with an open wound however the skin is not adherent. He went to see Dr. Earleen Newport who did an x-ray and culture of his foot I have not reviewed the x-ray but the patient was not told anything. He is on doxycycline 11/11; since the patient was last here he was in the emergency room on 10/30 he was concerned about swelling in the left foot. They did not do any cultures or x-rays. They changed his antibiotics to cephalexin. Previous culture showed group B strep. The cephalexin is appropriate as doxycycline has less than predictable coverage. Arrives in clinic today with swelling over this area under the wound. He also has a new wound on the right fifth metatarsal head 11/18; the patient has a difficult wound on the lateral aspect of the left fifth metatarsal head. The wound was almost ballotable last week I opened it slightly expecting to see purulence however there was just bleeding. I cultured this this was negative. X-ray unchanged. We are trying to get an MRI but I am not sure were going to be able to  get this through his insurance. He also has an area on the right lateral fifth metatarsal head this looks healthier 12/3; the patient finally got our MRI. Surprisingly this did not show osteomyelitis. I did show the soft tissue ulceration at the lateral plantar aspect of the fifth metatarsal base with a tiny residual 6 mm abscess overlying the superficial fascia I have tried to culture this area I have not been able to get this to grow anything. Nevertheless the protruding tissue looks aggravated. I suspect we should try to treat the underlying "abscess with broad-spectrum antibiotics. I am going to start him on Levaquin and Flagyl. He has much less edema in his legs and I am going to continue to wrap his legs and see him weekly 12/10. I started Levaquin and Flagyl on him last week. He just picked up the Flagyl apparently there was some delay. The worry is the wound on the left fifth metatarsal base which is  substantial and worsening. His foot looks like he inverts at the ankle making this a weightbearing surface. Certainly no improvement in fact I think the measurements of this are somewhat worse. We have been using 12/17; he apparently just got the Levaquin yesterday this is 2 weeks after the fact. He has completed the Flagyl. The area over the left fifth metatarsal base still has protruding granulation tissue although it does not look quite as bad as it did some weeks ago. He has severe bilateral lymphedema although we have not been treating him for wounds on his legs this is definitely going to require compression. There was so much edema in the left I did not wish to put him in a total contact cast today. I am going to increase his compression from 3-4 layer. The area on the right lateral fifth met head actually look quite good and superficial. 12/23; patient arrived with callus on the right fifth met head and the substantial hyper granulated callused wound on the base of his fifth metatarsal. He  says he is completing his Levaquin in 2 days but I do not think that adds up with what I gave him but I will have to double check this. We are using Hydrofera Blue on both areas. My plan is to put the left leg in a cast the week after New Year's 04/06/2020; patient's wounds about the same. Right lateral fifth metatarsal head and left lateral foot over the base of the fifth metatarsal. There is undermining on the left lateral foot which I removed before application of total contact cast continuing with Hydrofera Blue new. Patient tells me he was seen by endocrinology today lab work was done [Dr. Kerr]. Also wondering whether he was referred to cardiology. I went over some lab work from previously does not have chronic renal failure certainly not nephrotic range proteinuria he does have very poorly controlled diabetes but this is not his most updated lab work. Hemoglobin A1c has been over 11 1/10; the patient had a considerable amount of leakage towards mid part of his left foot with macerated skin however the wound surface looks better the area on the right lateral fifth met head is better as well. I am going to change the dressing on the left foot under the total contact cast to silver alginate, continue with Hydrofera Blue on the right. 1/20; patient was in the total contact cast for 10 days. Considerable amount of drainage although the skin around the wound does not look too bad on the left foot. The area on the right fifth metatarsal head is closed. Our nursing staff reports large amount of drainage out of the left lateral foot wound 1/25; continues with copious amounts of drainage described by our intake staff. PCR culture I did last week showed E. coli and Enterococcus faecalis and low quantities. Multiple resistance genes documented including extended spectrum beta lactamase, MRSA, MRSE, quinolone, tetracycline. The wound is not quite as good this week as it was 5 days ago but about the same  size 2/3; continues with copious amounts of malodorous drainage per our intake nurse. The PCR culture I did 2 weeks ago showed E. coli and low quantities of Enterococcus. There were multiple resistance genes detected. I put Neosporin on him last week although this does not seem to have helped. The wound is slightly deeper today. Offloading continues to be an issue here although with the amount of drainage she has a total contact cast is just not going to  work 2/10; moderate amount of drainage. Patient reports he cannot get his stocking on over the dressing. I told him we have to do that the nurse gave him suggestions on how to make this work. The wound is on the bottom and lateral part of his left foot. Is cultured predominantly grew low amounts of Enterococcus, E. coli and anaerobes. There were multiple resistance genes detected including extended spectrum beta lactamase, quinolone, tetracycline. I could not think of an easy oral combination to address this so for now I am going to do topical antibiotics provided by [facility] I think the main agents here are vancomycin and an aminoglycoside. We have to be able to give him access to the wounds to get the topical antibiotic on 2/17; moderate amount of drainage this is unchanged. He has his Keystone topical antibiotic against the deep tissue culture organisms. He has been using this and changing the dressing daily. Silver alginate on the wound surface. 2/24; using Keystone antibiotic with silver alginate on the top. He had too much drainage for a total contact cast at one point although I think that is improving and I think in the next week or 2 it might be possible to replace a total contact cast I did not do this today. In general the wound surface looks healthy however he continues to have thick rims of skin and subcutaneous tissue around the wide area of the circumference which I debrided 06/04/2020 upon evaluation today patient appears to be doing well  in regard to his wound. I do feel like he is showing signs of improvement. There is little bit of callus and dead tissue around the edges of the wound as well as what appears to be a little bit of a sinus tract that is off to the side laterally I would perform debridement to clear that away today. 3/17; left lateral foot. The wound looks about the same as I remember. Not much depth surface looks healthy. No evidence of infection 3/25; left lateral foot. Wound surface looks about the same. Separating epithelium from the circumference. There really is no evidence of infection here however not making progress by my view 3/29; left lateral foot. Surface of the wound again looks reasonably healthy still thick skin and subcutaneous tissue around the wound margins. There is no evidence of infection. One of the concerns being brought up by the nurses has again the amount of drainage vis--vis continued use of a total contact cast 4/5; left lateral foot at roughly the base of the fifth metatarsal. Nice healthy looking granulated tissue with rims of epithelialization. The overall wound measurements are not any better but the tissue looks healthy. The only concern is the amount of drainage although he has no surrounding maceration with what we have been doing recently to absorb fluid and protect his skin. He also has lymphedema. 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 He states that the kerlix/coban under the TCC rolled down and could not get it back up. He has been using an offloading scooter and has somehow hurt his right foot using this device. This happened last week. He states that the side of his right foot developed a blister and opened. The top of his foot  also has a few small open wounds he thinks is due to his socks rubbing in his shoes. He has not been using any dressings to the wound. He denies purulent drainage, fever/chills or erythema to the wounds. 4/22; patient presents for 1 week follow-up. He developed new wounds to the right foot that were evaluated at last clinic visit. He continues to have a total contact cast to the left leg and he reports no issues. He has been using silver collagen to the right foot wounds with no issues. He denies purulent drainage, fever/chills or erythema to the right foot wounds. He has no complaints today 4/25; patient presents for 1 week follow-up. He has a total contact cast of the left leg and reports no issues. He has been using silver alginate to the right foot wound. He denies purulent drainage, fever/chills or erythema to the right foot wounds. 5/2 patient presents for 1 week follow-up. T contact cast on the left. The wound which is on the base of the plantar foot at the base of the fifth metatarsal otal actually looks quite good and dimensions continue to gradually contract. HOWEVER the area on the right lateral fifth metatarsal head is much larger than what I remember from 2 weeks ago. Once more is he has significant levels of hypergranulation. Noteworthy that he had this same hyper granulated response on his wound on the left foot at one point in time. So much so that he I thought there was an underlying fluid collection. Based on this I think this just needs debridement. 5/9; the wound on the left actually continues to be gradually smaller with a healthy surface. Slight amount of drainage and maceration of the skin around but not too bad. However he has a large wound over the right fifth metatarsal head very much in the same configuration as his left foot wound was initially. I used silver nitrate to address the hyper granulated tissue no mechanical debridement 5/16; area on the left foot did not look  as healthy this week deeper thick surrounding macerated skin and subcutaneous tissue. The area on the right foot fifth met head was about the same The area on the right ankle that we identified last week is completely broken down into an open wound presumably a stocking rubbing issue 5/23; patient has been using a total contact cast to the left side. He has been using silver alginate underneath. He has also been using silver alginate to the right foot wounds. He has no complaints today. He denies any signs of infection. 5/31; the left-sided wound looks some better measure smaller surface granulation looks better. We have been using silver alginate under the total contact cast The large area on his right fifth met head and right dorsal foot look about the same still using silver alginate 6/6; neither side is good as I was hoping although the surface area dimensions are better. A lot of maceration on his left and right foot around the wound edge. Area on the dorsal right foot looks better. He says he was traveling. I am not sure what does the amount of maceration around the plantar wounds may be drainage issues 6/13; in general the wound surfaces look quite good on both sides. Macerated skin and raised edges around the wound required debridement although in general especially on the left the surface area seems improved. The area on the right dorsal ankle is about the same I thought this would not be such a problem to close 6/20;  not much change in either wound although the one on the right looks a little better. Both wounds have thick macerated edges to the skin requiring debridements. We have been using silver alginate. The area on the dorsal right ankle is still open I thought this would be closed. 6/28; patient comes in today with a marked deterioration in the right foot wound fifth met head. Wide area of exposed bone this is a drastic change from last time. The area on the left there we have been  casting is stagnant. We have been using silver alginate in both wound areas. 7/5; bone culture I did for PCR last time was positive for Pseudomonas, group B strep, Enterococcus and Staph aureus. There was no suggestion of methicillin resistance or ampicillin resistant genes. This was resistant to tetracycline however He comes into the clinic today with the area over his right plantar fifth metatarsal head which had been doing so well 2 weeks ago completely necrotic feeling bone. I do not know that this is going to be salvageable. The left foot wound is certainly no smaller but it has a better surface and is superficial. 7/8; patient called in this morning to say that his total contact cast was rubbing against his foot. He states he is doing fine overall. He denies signs of infection. 7/12; continued deterioration in the wound over the right fifth metatarsal head crumbling bone. This is not going to be salvageable. The patient agrees and wants to be referred to Dr. Doran Durand which we will attempt to arrange as soon as possible. I am going to continue him on antibiotics as long as that takes so I will renew those today. The area on the left foot which is the base of the fifth metatarsal continues to look somewhat better. Healthy looking tissue no depth no debridement is necessary here. 7/20; the patient was kindly seen by Dr. Doran Durand of orthopedics on 10/19/2020. 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 Towards this end we have taken him out of the total contact cast on the left we will put him in a wrap with Hydrofera Blue. As I understand things surgery is planned for 7/21 7/27; patient had his surgery last Thursday. 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 Apparently everything went well we did not still disturb that today The area on the left foot actually looks quite good. He has been much less mobile which  probably explains this he did not seem to do well in the total contact cast secondary to drainage and maceration I think. 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 Fortunately there is no evidence of active infection at this time. No fevers, chills, nausea, vomiting, or diarrhea. Overall I think that he is actually doing extremely well. Nonetheless I do believe that he is staying off of this more following the surgery in his right foot that is the reason the left is doing so great. 8/16; left plantar foot wound. This looks smaller than the last time I saw this he is using Hydrofera Blue. The surgical wound on the right foot is being followed by Dr. Doran Durand we did not look at this today. He has surgical shoes on both feet 8/23; left plantar foot wound not as good this week. Surrounding macerated  skin and subcutaneous tissue everything looks moist and wet. I do not think he is offloading this adequately. He is using a surgical shoe Apparently the right foot surgical wound is not open although I did not check his foot 8/31; left plantar foot lateral aspect. Much improved this week. He has no maceration. Some improvement in the surface area of the wound but most impressively the depth is come in we are using silver alginate. The patient is a Product/process development scientist. He is asked that we write him a letter so he can go back to work. I have also tried to see if we can write something that will allow him to limit the amount of time that he is on his foot at work. 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 Hopefully the latter can be avoided 9/6; I believe the patient missed an appointment last week. He arrives in today with a wound looking roughly the same certainly no better. Undermining laterally and also inferiorly. We  used molecuLight today in training with the patient's permission.. We are using silver alginate 9/21 wound is measuring bigger this week although this may have to do with the aggressive circumferential debridement last week in response to the blush fluorescence on the MolecuLight. Culture I did last week showed significant MSSA and E. coli. I put him on Augmentin but he has not started it yet. We are also going to send this for compounded antibiotics at [facility]. There is no evidence of systemic infection 9/29; silver alginate. His Keystone arrived. He is completing Augmentin in 2 days. Offloading in a cam boot. Moderate drainage per our intake staff 10/5; using silver alginate. He has been using his Star. He has completed his Augmentin. Per our intake nurse still a lot of drainage, far too much to consider a total contact cast. Wound measures about the same. He had the same undermining area that I defined last week from a roughly 11-3. I remove this today 10/12; using silver alginate he is using the Keystone. He comes in for a nurse visit hence we are applying [employer] twice a week. Measuring slightly better today and less notable drainage. Extensive debridement of the wound edge last time 10/18; using topical Keystone and silver alginate and a soft cast. Wound measurements about the same. Drainage was through his soft cast. We are changing this twice a week Tuesdays and Friday 10/25; comes in with moderate drainage. Still using Keystone silver alginate and a soft cast. Wound dimensions completely the same.He has a lot of edema in the left leg he has lymphedema. Asking for [name] to consider wrapping him as he cannot get his stocking on over the soft cast 11/2; comes in with moderate to large drainage slightly smaller in terms of width we have been using [name]. His wound looks satisfactory but not much improvement 11/4; patient presents today for obligatory cast change. Has no issues or  complaints today. He denies signs of infection. 11/9; patient traveled this weekend to [place], was on the cast quite a bit. Staining of the cast with black material from his walking boot. Drainage was not quite as bad as we feared. Using silver alginate and Keystone 11/16; we do not have size for cast therefore we have been putting a soft cast on him since the change on Friday. Still a significant amount of drainage necessitating changing twice a week. We have been using  the [place] at cast changes either hard or soft as well as silver alginate Comes in the clinic with things actually looking fairly good improvement in width. He says his offloading is about the same 02/24/2021 upon evaluation today patient actually comes back in and is doing excellent in regard to his foot ulcer this is significantly smaller even compared to the last visit. The soft cast seems to have done extremely well for him which is great news. I do not see any signs of infection minimal debridement will be needed today. 11/30; left lateral foot much improved half a centimeter improvement in surface area. No evidence of infection. He seems to be doing better with the soft cast in the TCC therefore we will continue with this. He comes back in later in the week for a change with the nurses. This is due to drainage 12/6; no improvement in dimensions. Under illumination some debris on the surface we have been using silver alginate, soft cast. If there is anything optimistic here he seems to have have less drainage 12/13. Dimensions are improved both length and width and slightly in depth. Appears to be quite healthy today. Raised edges of this thick skin and callus around the edges however. He is in a soft cast were bringing him back once for a change on Friday. Drainage is better 12/20. Dimensions are improved. He still has raised edges of thick skin and callus around the edges. We are using a soft cast 12/28; comes in today with  thick callus around the wound. Using silver under alginate under a soft cast. I do not think there is much improvement in any measurement 2023 04/06/2021; patient was put in a total contact cast. Unfortunately not much change in surface area 1/10; not much different still thick callus and skin around the edge in spite of the total contact cast. This was just debrided last week we have been using the [facility] compounded antibiotic and silver alginate under a total contact cast 1/18 the patient's wound on the left side is doing nicely. smaller HOWEVER he comes in today with a wound on the right foot laterally. blister most likely serosangquenous drainage 1/24; the patient continues to do well in terms of the plantar left foot which is continued to contract using silver alginate under the total contact cast HOWEVER the right lateral foot is bigger with denuded skin around the edges. I used pickups and a #15 scalpel to remove this this looks like the remanence of a large blister. Cannot rule out infection. Culture in this area I did last week showed Staphylococcus lugdunensis few colonies. I am going to try to address this with his [employer] antibiotic that is done so well on the left having linezolid and this should cover the staph 2/1; the patient's wound on his left foot which was the original plantar foot wound thick skin and eschar around the edges even in the total contact cast but the wound surface does not look too bad The real problem is on how his right lateral foot at roughly the base of the fifth metatarsal. The wound is completely necrotic more worrisome than that there is swelling around the edges of this. We have been using silver alginate on both wounds and Keystone on the right foot. Unfortunately I think he is going to require systemic antibiotics while we await cultures. He did not get the x-ray done that we ordered last week [lost the prescription 2/7; disappointingly in the area on the  left  foot which we are treating with a total contact cast is still not closed although it is much smaller. He continues to have a lot of callus around the wound edge. -Right lateral foot culture I did last week was negative x-ray also negative for osteomyelitis. 2/15: TCC silver alginate on the left and silver alginate on the right lateral. No real improvement in either area Electronic Signature(s) Signed: 05/19/2021 4:36:32 PM By: [name] MD Entered By: [name] on 05/19/2021 09:43:19 -------------------------------------------------------------------------------- Physical Exam Details Patient Name: Date of Service: [name], [name] 05/19/2021 8:30 A M Medical Record Number: [id number] Patient Account Number: [id number] Date of Birth/Sex: Treating RN: [birth year] (36 y.o. M) Primary Care Provider: [name] Other Clinician: Referring Provider: Treating Provider/Extender: [name] in Treatment: 56 Constitutional Patient is hypertensive.. Pulse regular and within target range for patient.Marland Kitchen Respirations regular, non-labored and within target range.. Temperature is normal and within the target range for the patient.Marland Kitchen Appears in no distress. Notes left foot significant undermining. debrideed with 3 currette. right foot, not viable surface, debrided bleeds very easilyj Electronic Signature(s) Signed: 05/19/2021 4:36:32 PM By: [name] MD Entered By: [name] on 05/19/2021 09:44:55 -------------------------------------------------------------------------------- Physician Orders Details Patient Name: Date of Service: [name], [name] 05/19/2021 8:30 A M Medical Record Number: [id number] Patient Account Number: [id number] Date of Birth/Sex: Treating RN: 1986/03/13 (36 y.o. 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 - Dr. Dellia Nims room 1 Return A Bathing/ Shower/ Hygiene May shower with protection but do not get wound dressing(s) wet. - Use Cast Protector Bag Edema Control - Lymphedema / SCD / Other Bilateral Lower Extremities Elevate legs to the level of the heart or above for 30 minutes daily and/or when sitting, a frequency of: - throughout the day Avoid standing for long periods of time. Exercise regularly Moisturize legs daily. - right leg every night before bed. Compression stocking or Garment 20-30 mm/Hg pressure to: - Apply to right leg in the morning and remove at night. Off-Loading Total Contact Cast to Left Lower Extremity Open toe surgical shoe to: - right foot Other: - minimal weight bearing left foot Additional Orders / Instructions Follow Nutritious Diet Wound Treatment Wound #3 - Foot Wound Laterality: Left, Lateral Cleanser: Soap and Water 1 x Per Week/7 Days Discharge Instructions: May shower and wash wound with dial antibacterial soap and water prior to dressing change. Topical: keystone 1 x Per Week/7 Days Prim Dressing: Hydrofera Blue Classic Foam, 2x2 in 1 x Per Week/7 Days ary Discharge Instructions: Moisten with saline prior to applying to wound bed Secondary Dressing: Woven Gauze Sponge, Non-Sterile 4x4 in 1 x Per Week/7 Days Discharge Instructions: Apply over primary dressing as directed. Secondary Dressing: Optifoam Non-Adhesive Dressing, 4x4 in 1 x Per Week/7 Days Discharge Instructions: Apply over primary dressing as directed. Compression Wrap: Kerlix Roll 4.5x3.1 (in/yd) 1 x Per Week/7  Days Discharge Instructions: Apply Kerlix and Coban compression as directed. Compression Wrap: Coban Self-Adherent Wrap 4x5 (in/yd) 1 x Per Week/7 Days Discharge Instructions: Apply over Kerlix as directed. Wound #8 - Foot Wound  Laterality: Right, Lateral Cleanser: Soap and Water Every Other Day/15 Days Discharge Instructions: May shower and wash wound with dial antibacterial soap and water prior to dressing change. Topical: keystone Every Other Day/15 Days Prim Dressing: KerraCel Ag Gelling Fiber Dressing, 4x5 in (silver alginate) (Generic) Every Other Day/15 Days ary Discharge Instructions: Apply silver alginate to wound bed as instructed Secondary Dressing: Woven Gauze Sponge, Non-Sterile 4x4 in (Generic) Every Other Day/15 Days Discharge Instructions: Apply over primary dressing as directed. Secondary Dressing: Optifoam Non-Adhesive Dressing, 4x4 in (Generic) Every Other Day/15 Days Discharge Instructions: Apply over primary dressing as directed. Secured With: Coban Self-Adherent Wrap 4x5 (in/yd) (Generic) Every Other Day/15 Days Discharge Instructions: Secure with Coban as directed. Secured With: [employer], 4.5x3.1 (in/yd) (Generic) Every Other Day/15 Days Discharge Instructions: Secure with Kerlix as directed. Secured With: 77M Medipore H Soft Cloth Surgical T ape, 4 x 10 (in/yd) (Generic) Every Other Day/15 Days Discharge Instructions: Secure with tape as directed. Electronic Signature(s) Signed: 05/19/2021 4:36:32 PM By: [name] MD Signed: 05/19/2021 5:14:15 PM By: Baruch Gouty RN, BSN Entered By: Baruch Gouty on 05/19/2021 09:07:35 -------------------------------------------------------------------------------- Problem List Details Patient Name: Date of Service: [name], [name] 05/19/2021 8:30 A M Medical Record Number: [id number] Patient Account Number: [id number] Date of Birth/Sex: Treating RN: 09-14-1985 (36 y.o. [name] Primary Care  Provider: [name] Other Clinician: Referring Provider: Treating Provider/Extender: [name] in Treatment: 11 Active Problems ICD-10 Encounter Code Description Active Date MDM Diagnosis E11.621 Type 2 diabetes mellitus with foot ulcer 10/24/2019 No Yes L97.528 Non-pressure chronic ulcer of other part of left foot with other specified 10/24/2019 No Yes severity L97.518 Non-pressure chronic ulcer of other part of right foot with other specified 10/24/2019 No Yes severity Inactive Problems ICD-10 Code Description Active Date Inactive Date L97.518 Non-pressure chronic ulcer of other part of right foot with other specified severity 07/14/2020 07/14/2020 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, right ankle and foot 10/24/2019 10/24/2019 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 08/10/2020 08/10/2020 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, left ankle and foot 10/24/2019 10/24/2019 B95.62 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 10/24/2019 10/24/2019 classified elsewhere Resolved Problems Electronic Signature(s) Signed: 05/19/2021 4:36:32 PM By: [name] MD Entered By: [name] on 05/19/2021 09:39:35 -------------------------------------------------------------------------------- Progress Note Details Patient Name: Date of Service: [name], [name] 05/19/2021 8:30 A M Medical Record Number: [id number] Patient Account Number: [id number] Date of Birth/Sex: Treating RN: 01-Oct-1985 (36 y.o. 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 He comes in for review of a wound over the plantar fifth metatarsal  head extending into the lateral part of the foot. He was followed for this previously by his podiatrist Dr. Cornelius Moras. As the patient tells his story he went to see podiatry first for a swelling he developed on the lateral part of his fifth metatarsal head in May. He states this was "open" by podiatry and the area closed. He was followed up in June and it was again opened callus removed and it closed promptly. There were plans being made for surgery on the fifth metatarsal head in June however his blood sugar was apparently too high for anesthesia. Apparently the area was debrided and opened again in June and it  is never closed since. Looking over the records from podiatry I am really not able to follow this. It was clear when he was first seen it was before 5/14 at that point he already had a wound. By 5/17 the ulcer was resolved. I do not see anything about a procedure. On 5/28 noted to have pre-ulcerative moderate keratosis. X-ray noted 1/5 contracted toe and tailor's bunion and metatarsal deformity. On a visit date on 09/28/2018 the dorsal part of the left foot it healed and resolved. There was concern about swelling in his lower extremity he was sent to the ER.. As far as I can tell he was seen in the ER on 7/12 with an ulcer on his left foot. A DVT rule out of the left leg was negative. I do not think I have complete records from podiatry but I am not able to verify the procedures this patient states he had. He states after the last procedure the wound has never closed although I am not able to follow this in the records I have from podiatry. He has not had a recent x-ray The patient has been using Neosporin on the wound. He is wearing a Darco shoe. He is still very active up on his foot working and exercising. Past medical history; type 2 diabetes ketosis-prone, leg swelling with a negative DVT study in July. Non-smoker ABI in our clinic was 0.85 on the left 10/16; substantial wound on the plantar  left fifth met head extending laterally almost to the dorsal fifth MTP. We have been using silver alginate we gave him a Darco forefoot off loader. An x-ray did not show evidence of osteomyelitis did note soft tissue emphysema which I think was due to gas tracking through an open wound. There is no doubt in my mind he requires an MRI 10/23; MRI not booked until 3 November at the earliest this is largely due to his glucose sensor in the right arm. We have been using silver alginate. There has been an improvement 10/29; I am still not exactly sure when his MRI is booked for. He says it is the third but it is the 10th in epic. This definitely needs to be done. He is running a low-grade fever today but no other symptoms. No real improvement in the 1 02/26/2019 patient presents today for a follow-up visit here in our clinic he is last been seen in the clinic on October 29. Subsequently we were working on getting MRI to evaluate and see what exactly was going on and where we would need to go from the standpoint of whether or not he had osteomyelitis and again what treatments were going be required. Subsequently the patient ended up being admitted to the hospital on 02/07/2019 and was discharged on 02/14/2019. This is a somewhat interesting admission with a discharge diagnosis of pneumonia due to COVID-19 although he was positive for COVID-19 when tested at the urgent care but negative x2 when he was actually in the hospital. With that being said he did have acute respiratory failure with hypoxia and it was noted he also have a left foot ulceration with osteomyelitis. With that being said he did require oxygen for his pneumonia and I level 4 L. He was placed on antivirals and steroids for the COVID-19. He was also transferred to the [facility] at one point. Nonetheless he did subsequently discharged home and since being home has done much better in that regard. The CT angiogram did not show any  pulmonary embolism. With regard to the osteomyelitis the patient was placed on vancomycin and Zosyn while in the hospital but has been changed to Augmentin at discharge. It was also recommended that he follow- up with wound care and podiatry. Podiatry however wanted him to see [name] according to the patient prior to them doing anything further. His hemoglobin A1c was 9.9 as noted in the hospital. Have an MRI of the left foot performed while in the hospital on 02/04/2019. 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 There is an overlying plantar open wound noted an abscess tracking back along the lateral aspect of the fifth metatarsal shaft. There is otherwise diffuse cellulitis and mild fasciitis without findings of polymyositis. The patient did have recently pneumonia secondary to COVID-19 I looked in the chart through epic and it does appear that the patient may need to have an additional x-ray just to ensure everything is cleared and that he has no airspace disease prior to putting him into the [name]. 03/05/2019; patient was readmitted to the clinic last week. He was hospitalized twice for a viral upper respiratory tract infection from 11/1 through 11/4 and then 11/5 through 11/12 ultimately this turned out to be Covid pneumonitis. Although he was discharged on oxygen he is not using it. He says he feels fine. He has no exercise limitation no cough no sputum. His O2 sat in our clinic today was 100% on room air. 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 He received Vanco and Zosyn in the hospital and then was discharged on 2 weeks of Augmentin. I do not see any relevant cultures. He was supposed to follow-up with infectious disease but I do not see that he has an appointment. 12/8; patient saw  Dr. Novella Olive of infectious disease last week. He felt that he had had adequate antibiotic therapy. He did not go to follow-up with Dr. Amalia Hailey of podiatry and I have again talked to him about the pros and cons of this. He does not want to consider a ray amputation of this time. He is aware of the risks of recurrence, migration etc. He started HBO today and tolerated this well. He can complete the Augmentin that I gave him last week. I have looked over the lab work that Dr. Chana Bode ordered his C-reactive protein was 3.3 and his sedimentation rate was 17. The C-reactive protein is never really been measurably that high in this patient 12/15; not much change in the wound today however he has undermining along the lateral part of the foot again more extensively than last week. He has some rims of epithelialization. We have been using silver alginate. He is undergoing hyperbarics but did not dive today 12/18; in for his obligatory first total contact cast change. Unfortunately there was pus coming from the undermining area around his fifth metatarsal head. This was cultured but will preclude reapplication of a cast. He is seen in conjunction with HBO 12/24; patient had staph lugdunensis in the wound in the undermining area laterally last time. We put him on doxycycline which should have covered this. The wound looks better today. I am going to give him another week of doxycycline before reattempting the total contact cast 12/31; the patient is completing antibiotics. Hemorrhagic debris in the distal part of the wound with some undermining distally.  He also had hyper granulation. Extensive debridement with a #5 curette. The infected area that was on the lateral part of the fifth met head is closed over. I do not think he needs any more antibiotics. Patient was seen prior to HBO. Preparations for a total contact cast were made in the cast will be placed post hyperbarics 04/11/19; once again the patient arrives  today without complaint. He had been in a cast all week noted that he had heavy drainage this week. This resulted in large raised areas of macerated tissue around the wound 1/14; wound bed looks better slightly smaller. Hydrofera Blue has been changing himself. He had a heavy drainage last week which caused a lot of maceration around the wound so I took him out of a total contact cast he says the drainage is actually better this week He is seen today in conjunction with HBO 1/21; returns to clinic. He was up in [place] for a day or 2 attending a funeral. He comes back in with the wound larger and with a large area of exposed bone. He had osteomyelitis and septic arthritis of the fifth left metatarsal head while he was in hospital. He received IV antibiotics in the hospital for a prolonged period of time then 3 weeks of Augmentin. Subsequently I gave him 2 weeks of doxycycline for more superficial wound infection. When I saw this last week the wound was smaller the surface of the wound looks satisfactory. 1/28; patient missed hyperbarics today. Bone biopsy I did last time showed Enterococcus faecalis and Staphylococcus lugdunensis . He has a wide area of exposed bone. We are going to use silver alginate as of today. I had another ethical discussion with the patient. 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 In this situation I think the likelihood of healing this is low. Therefore I have recommended a ray amputation and with the patient's agreement I have referred him to Dr. Doran Durand. The other issue is that his compliance with hyperbarics has been minimal because of his work schedule and given his underlying decision I am going to stop this today READMISSION 10/24/2019 MRI 09/29/2019 left foot IMPRESSION: 1. Apparent skin ulceration inferior and lateral to the 5th metatarsal base with underlying heterogeneous T2 signal and enhancement in the subcutaneous fat.  Small peripherally enhancing fluid collections along the plantar and lateral aspects of the 5th metatarsal base suspicious for abscesses. 2. Interval amputation through the mid 5th metatarsal with nonspecific low-level marrow edema and enhancement. Given the proximity to the adjacent soft tissue inflammatory changes, osteomyelitis cannot be excluded. 3. The additional bones appear unremarkable. MRI 09/29/2019 right foot IMPRESSION: 1. Soft tissue ulceration lateral to the 5th MTP joint. There is low-level T2 hyperintensity within the 4th and 5th metatarsal heads and adjacent proximal phalanges without abnormal T1 signal or cortical destruction. These findings are nonspecific and could be seen with early marrow edema, hyperemia or early osteomyelitis. No evidence of septic joint. 2. Mild tenosynovitis and synovial enhancement associated with the extensor digitorum tendons at the level of the midfoot. 3. Diffuse low-level muscular T2 hyperintensity and enhancement, most consistent with diabetic myopathy. LEFT FOOT BONE Methicillin resistant staphylococcus aureus Staphylococcus lugdunensis MIC MIC CIPROFLOXACIN >=8 RESISTANT Resistant <=0.5 SENSI... Sensitive CLINDAMYCIN <=0.25 SENS... Sensitive >=8 RESISTANT Resistant ERYTHROMYCIN >=8 RESISTANT Resistant >=8 RESISTANT Resistant GENTAMICIN <=0.5 SENSI... Sensitive <=0.5 SENSI... Sensitive Inducible Clindamycin NEGATIVE Sensitive NEGATIVE Sensitive OXACILLIN >=4 RESISTANT Resistant 2 SENSITIVE Sensitive RIFAMPIN <=0.5 SENSI... Sensitive <=0.5 SENSI... Sensitive  TETRACYCLINE <=1 SENSITIVE Sensitive <=1 SENSITIVE Sensitive TRIMETH/SULFA <=10 SENSIT Sensitive <=10 SENSIT Sensitive ... Marland Kitchen.. VANCOMYCIN 1 SENSITIVE Sensitive <=0.5 SENSI... Sensitive Right foot bone . Component 3 wk ago Specimen Description BONE Special Requests RIGHT 4 METATARSAL SAMPLE B Gram Stain NO WBC SEEN NO ORGANISMS SEEN Culture RARE METHICILLIN RESISTANT  STAPHYLOCOCCUS AUREUS NO ANAEROBES ISOLATED Performed at [facility], [place] [address] [place], [place] [address] Report Status 10/08/2019 FINAL Organism ID, Bacteria METHICILLIN RESISTANT STAPHYLOCOCCUS AUREUS Resulting Agency [facility] Susceptibility Methicillin resistant staphylococcus aureus MIC CIPROFLOXACIN >=8 RESISTANT Resistant CLINDAMYCIN <=0.25 SENS... Sensitive ERYTHROMYCIN >=8 RESISTANT Resistant GENTAMICIN <=0.5 SENSI... Sensitive Inducible Clindamycin NEGATIVE Sensitive OXACILLIN >=4 RESISTANT Resistant RIFAMPIN <=0.5 SENSI... Sensitive TETRACYCLINE <=1 SENSITIVE Sensitive TRIMETH/SULFA <=10 SENSIT Sensitive ... VANCOMYCIN 1 SENSITIVE Sensitive This is a patient we had in clinic earlier this year with a wound over his left fifth metatarsal head. 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 Doran Durand on 05/16/2019. He seems to have developed problems with wounds on his bilateral feet in June 2021 although he may have had problems earlier than this. He was in an urgent care with a right foot ulcer on 09/26/2019 and given a course of doxycycline. This was apparently after having trouble getting into see orthopedics. He was seen by podiatry on 09/28/2019 noted to have bilateral lower extremity ulcers including the left lateral fifth metatarsal base and the right subfifth met head. It was noted that had purulent drainage at that time. He required hospitalization from 6/20 through 7/2. This was because of worsening right foot wounds. He underwent bilateral operative incision and drainage and bone biopsies bilaterally. Culture results are listed above. He has been referred back to clinic by Dr. Jacqualyn Posey  of podiatry. He is also followed by Dr. Megan Salon who saw him yesterday. He was discharged from hospital on Zyvox Flagyl and Levaquin and yesterday changed to doxycycline Flagyl and Levaquin. His inflammatory markers on 6/26 showed a sedimentation rate of 129 and a C-reactive protein of 5. This is improved to 14 and 1.3 respectively. This would indicate improvement. ABIs in our clinic today were 1.23 on the right and 1.20 on the left 11/01/2019 on evaluation today patient appears to be doing fairly well in regard to the wounds on his feet at this point. Fortunately there is no signs of active infection at this time. No fevers, chills, nausea, vomiting, or diarrhea. He currently is seeing infectious disease and still under their care at this point. Subsequently he also has both wounds which she has not been using collagen on as he did not receive that in his packaging he did not call us and let us know that. Apparently that just was missed on the order. Nonetheless we will get that straightened out today. 8/9-Patient returns for bilateral foot wounds, using Prisma with hydrogel moistened dressings, and the wounds appear stable. Patient using surgical shoes, avoiding much pressure or weightbearing as much as possible 8/16; patient has bilateral foot wounds. 1 on the right lateral foot proximally the other is on the left mid lateral foot. Both required debridement of callus and thick skin around the wounds. We have been using silver collagen 8/27; patient has bilateral lateral foot wounds. The area on the left  substantially surrounded by callus and dry skin. This was removed from the wound edge. The underlying wound is small. The area on the right measured somewhat smaller today. 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 Unfortunately I did not update his antibiotics during today's visit. 9/10 I reviewed Dr. Hale Bogus last notes he felt  he had completed antibiotics his inflammatory markers were reasonably well controlled. He has a small wound on the lateral left foot and a tiny area on the right which is just above closed. He is using Hydrofera Blue with border foam he has bilateral surgical shoes 9/24; 2 week f/u. doing well. right foot is closed. left foot still undermined. 10/14; right foot remains closed at the fifth met head. The area over the base of the left fifth metatarsal has a small open area but considerable undermining towards the plantar foot. Thick callus skin around this suggests an adequate pressure relief. We have talked about this. He says he is going to go back into his cam boot. I suggested a total contact cast he did not seem enamored with this suggestion 10/26; left foot base of the fifth metatarsal. Same condition as last time. He has skin over the area with an open wound however the skin is not adherent. He went to see Dr. Earleen Newport who did an x-ray and culture of his foot I have not reviewed the x-ray but the patient was not told anything. He is on doxycycline 11/11; since the patient was last here he was in the emergency room on 10/30 he was concerned about swelling in the left foot. They did not do any cultures or x-rays. They changed his antibiotics to cephalexin. Previous culture showed group B strep. The cephalexin is appropriate as doxycycline has less than predictable coverage. Arrives in clinic today with swelling over this area under the wound. He also has a new wound on the right fifth metatarsal head 11/18; the patient has a difficult wound on the lateral aspect of the left fifth metatarsal head. The wound was almost ballotable last week I opened it slightly expecting to see purulence however there was just bleeding. I cultured this this was negative. X-ray unchanged. We are trying to get an MRI but I am not sure were going to be able to get this through his insurance. He also has an area on the right  lateral fifth metatarsal head this looks healthier 12/3; the patient finally got our MRI. Surprisingly this did not show osteomyelitis. I did show the soft tissue ulceration at the lateral plantar aspect of the fifth metatarsal base with a tiny residual 6 mm abscess overlying the superficial fascia I have tried to culture this area I have not been able to get this to grow anything. Nevertheless the protruding tissue looks aggravated. I suspect we should try to treat the underlying "abscess with broad-spectrum antibiotics. I am going to start him on Levaquin and Flagyl. He has much less edema in his legs and I am going to continue to wrap his legs and see him weekly 12/10. I started Levaquin and Flagyl on him last week. He just picked up the Flagyl apparently there was some delay. The worry is the wound on the left fifth metatarsal base which is substantial and worsening. His foot looks like he inverts at the ankle making this a weightbearing surface. Certainly no improvement in fact I think the measurements of this are somewhat worse. We have been  using 12/17; he apparently just got the Levaquin yesterday this is 2 weeks after the fact. He has completed the Flagyl. The area over the left fifth metatarsal base still has protruding granulation tissue although it does not look quite as bad as it did some weeks ago. He has severe bilateral lymphedema although we have not been treating him for wounds on his legs this is definitely going to require compression. There was so much edema in the left I did not wish to put him in a total contact cast today. I am going to increase his compression from 3-4 layer. The area on the right lateral fifth met head actually look quite good and superficial. 12/23; patient arrived with callus on the right fifth met head and the substantial hyper granulated callused wound on the base of his fifth metatarsal. He says he is completing his Levaquin in 2 days but I do not think  that adds up with what I gave him but I will have to double check this. We are using Hydrofera Blue on both areas. My plan is to put the left leg in a cast the week after New Year's 04/06/2020; patient's wounds about the same. Right lateral fifth metatarsal head and left lateral foot over the base of the fifth metatarsal. There is undermining on the left lateral foot which I removed before application of total contact cast continuing with Hydrofera Blue new. Patient tells me he was seen by endocrinology today lab work was done [Dr. Kerr]. Also wondering whether he was referred to cardiology. I went over some lab work from previously does not have chronic renal failure certainly not nephrotic range proteinuria he does have very poorly controlled diabetes but this is not his most updated lab work. Hemoglobin A1c has been over 11 1/10; the patient had a considerable amount of leakage towards mid part of his left foot with macerated skin however the wound surface looks better the area on the right lateral fifth met head is better as well. I am going to change the dressing on the left foot under the total contact cast to silver alginate, continue with Hydrofera Blue on the right. 1/20; patient was in the total contact cast for 10 days. Considerable amount of drainage although the skin around the wound does not look too bad on the left foot. The area on the right fifth metatarsal head is closed. Our nursing staff reports large amount of drainage out of the left lateral foot wound 1/25; continues with copious amounts of drainage described by our intake staff. PCR culture I did last week showed E. coli and Enterococcus faecalis and low quantities. Multiple resistance genes documented including extended spectrum beta lactamase, MRSA, MRSE, quinolone, tetracycline. The wound is not quite as good this week as it was 5 days ago but about the same size 2/3; continues with copious amounts of malodorous drainage per  our intake nurse. The PCR culture I did 2 weeks ago showed E. coli and low quantities of Enterococcus. There were multiple resistance genes detected. I put Neosporin on him last week although this does not seem to have helped. The wound is slightly deeper today. Offloading continues to be an issue here although with the amount of drainage she has a total contact cast is just not going to work 2/10; moderate amount of drainage. Patient reports he cannot get his stocking on over the dressing. I told him we have to do that the nurse gave him suggestions on how to make  this work. The wound is on the bottom and lateral part of his left foot. Is cultured predominantly grew low amounts of Enterococcus, E. coli and anaerobes. There were multiple resistance genes detected including extended spectrum beta lactamase, quinolone, tetracycline. I could not think of an easy oral combination to address this so for now I am going to do topical antibiotics provided by [facility] I think the main agents here are vancomycin and an aminoglycoside. We have to be able to give him access to the wounds to get the topical antibiotic on 2/17; moderate amount of drainage this is unchanged. He has his Keystone topical antibiotic against the deep tissue culture organisms. He has been using this and changing the dressing daily. Silver alginate on the wound surface. 2/24; using Keystone antibiotic with silver alginate on the top. He had too much drainage for a total contact cast at one point although I think that is improving and I think in the next week or 2 it might be possible to replace a total contact cast I did not do this today. In general the wound surface looks healthy however he continues to have thick rims of skin and subcutaneous tissue around the wide area of the circumference which I debrided 06/04/2020 upon evaluation today patient appears to be doing well in regard to his wound. I do feel like he is showing signs of  improvement. There is little bit of callus and dead tissue around the edges of the wound as well as what appears to be a little bit of a sinus tract that is off to the side laterally I would perform debridement to clear that away today. 3/17; left lateral foot. The wound looks about the same as I remember. Not much depth surface looks healthy. No evidence of infection 3/25; left lateral foot. Wound surface looks about the same. Separating epithelium from the circumference. There really is no evidence of infection here however not making progress by my view 3/29; left lateral foot. Surface of the wound again looks reasonably healthy still thick skin and subcutaneous tissue around the wound margins. There is no evidence of infection. One of the concerns being brought up by the nurses has again the amount of drainage vis--vis continued use of a total contact cast 4/5; left lateral foot at roughly the base of the fifth metatarsal. Nice healthy looking granulated tissue with rims of epithelialization. The overall wound measurements are not any better but the tissue looks healthy. The only concern is the amount of drainage although he has no surrounding maceration with what we have been doing recently to absorb fluid and protect his skin. He also has lymphedema. 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 He states that the kerlix/coban under the TCC rolled down and could not get it back up. He has been using an offloading scooter and has somehow hurt his right foot using this device. This happened last week. He states that the side of his right foot developed a blister and opened. The top of his foot also has a few small open wounds he thinks is due to his  socks rubbing in his shoes. He has not been using any dressings to the wound. He denies purulent drainage, fever/chills or  erythema to the wounds. 4/22; patient presents for 1 week follow-up. He developed new wounds to the right foot that were evaluated at last clinic visit. He continues to have a total contact cast to the left leg and he reports no issues. He has been using silver collagen to the right foot wounds with no issues. He denies purulent drainage, fever/chills or erythema to the right foot wounds. He has no complaints today 4/25; patient presents for 1 week follow-up. He has a total contact cast of the left leg and reports no issues. He has been using silver alginate to the right foot wound. He denies purulent drainage, fever/chills or erythema to the right foot wounds. 5/2 patient presents for 1 week follow-up. T contact cast on the left. The wound which is on the base of the plantar foot at the base of the fifth metatarsal otal actually looks quite good and dimensions continue to gradually contract. HOWEVER the area on the right lateral fifth metatarsal head is much larger than what I remember from 2 weeks ago. Once more is he has significant levels of hypergranulation. Noteworthy that he had this same hyper granulated response on his wound on the left foot at one point in time. So much so that he I thought there was an underlying fluid collection. Based on this I think this just needs debridement. 5/9; the wound on the left actually continues to be gradually smaller with a healthy surface. Slight amount of drainage and maceration of the skin around but not too bad. However he has a large wound over the right fifth metatarsal head very much in the same configuration as his left foot wound was initially. I used silver nitrate to address the hyper granulated tissue no mechanical debridement 5/16; area on the left foot did not look as healthy this week deeper thick surrounding macerated  skin and subcutaneous tissue. oo The area on the right foot fifth met head was about the same oo The area on the right ankle that we identified last week is completely broken down into an open wound presumably a stocking rubbing issue 5/23; patient has been using a total contact cast to the left side. He has been using silver alginate underneath. He has also been using silver alginate to the right foot wounds. He has no complaints today. He denies any signs of infection. 5/31; the left-sided wound looks some better measure smaller surface granulation looks better. We have been using silver alginate under the total contact cast oo The large area on his right fifth met head and right dorsal foot look about the same still using silver alginate 6/6; neither side is good as I was hoping although the surface area dimensions are better. A lot of maceration on his left and right foot around the wound edge. Area on the dorsal right foot looks better. He says he was traveling. I am not sure what does the amount of maceration around the plantar wounds may be drainage issues 6/13; in general the wound surfaces look quite good on both sides. Macerated skin and raised edges around the wound required debridement although in general especially on the left the surface area seems improved. oo The area on the right dorsal ankle is about the same I thought this would not be such a problem to close 6/20; not much change in either wound although the one on the right looks a little better. Both wounds have thick macerated edges to the skin requiring debridements. We have been  using silver alginate. The area on the dorsal right ankle is still open I thought this would be closed. 6/28; patient comes in today with a marked deterioration in the right foot wound fifth met head. Wide area of exposed bone this is a drastic change from last time. The area on the left there we have been casting is stagnant. We have been using  silver alginate in both wound areas. 7/5; bone culture I did for PCR last time was positive for Pseudomonas, group B strep, Enterococcus and Staph aureus. There was no suggestion of methicillin resistance or ampicillin resistant genes. This was resistant to tetracycline however He comes into the clinic today with the area over his right plantar fifth metatarsal head which had been doing so well 2 weeks ago completely necrotic feeling bone. I do not know that this is going to be salvageable. The left foot wound is certainly no smaller but it has a better surface and is superficial. 7/8; patient called in this morning to say that his total contact cast was rubbing against his foot. He states he is doing fine overall. He denies signs of infection. 7/12; continued deterioration in the wound over the right fifth metatarsal head crumbling bone. This is not going to be salvageable. The patient agrees and wants to be referred to Dr. Doran Durand which we will attempt to arrange as soon as possible. I am going to continue him on antibiotics as long as that takes so I will renew those today. The area on the left foot which is the base of the fifth metatarsal continues to look somewhat better. Healthy looking tissue no depth no debridement is necessary here. 7/20; the patient was kindly seen by Dr. Doran Durand of orthopedics on 10/19/2020. 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 Towards this end we have taken him out of the total contact cast on the left we will put him in a wrap with Hydrofera Blue. As I understand things surgery is planned for 7/21 7/27; patient had his surgery last Thursday. 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 Apparently everything went well we did not still disturb that today The area on the left foot actually looks quite good. He has been much less mobile which probably explains this he did not seem  to do well in the total contact cast secondary to drainage and maceration I think. 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 Fortunately there is no evidence of active infection at this time. No fevers, chills, nausea, vomiting, or diarrhea. Overall I think that he is actually doing extremely well. Nonetheless I do believe that he is staying off of this more following the surgery in his right foot that is the reason the left is doing so great. 8/16; left plantar foot wound. This looks smaller than the last time I saw this he is using Hydrofera Blue. The surgical wound on the right foot is being followed by Dr. Doran Durand we did not look at this today. He has surgical shoes on both feet 8/23; left plantar foot wound not as good this week. Surrounding macerated skin and subcutaneous tissue everything looks moist and wet. I do not think he is offloading this adequately. He is using a surgical shoe Apparently the right foot surgical wound  is not open although I did not check his foot 8/31; left plantar foot lateral aspect. Much improved this week. He has no maceration. Some improvement in the surface area of the wound but most impressively the depth is come in we are using silver alginate. The patient is a Product/process development scientist. He is asked that we write him a letter so he can go back to work. I have also tried to see if we can write something that will allow him to limit the amount of time that he is on his foot at work. 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 Hopefully the latter can be avoided 9/6; I believe the patient missed an appointment last week. He arrives in today with a wound looking roughly the same certainly no better. Undermining laterally and also inferiorly. We used molecuLight today in training  with the patient's permission.. We are using silver alginate 9/21 wound is measuring bigger this week although this may have to do with the aggressive circumferential debridement last week in response to the blush fluorescence on the MolecuLight. Culture I did last week showed significant MSSA and E. coli. I put him on Augmentin but he has not started it yet. We are also going to send this for compounded antibiotics at [facility]. There is no evidence of systemic infection 9/29; silver alginate. His Keystone arrived. He is completing Augmentin in 2 days. Offloading in a cam boot. Moderate drainage per our intake staff 10/5; using silver alginate. He has been using his [place]. He has completed his Augmentin. Per our intake nurse still a lot of drainage, far too much to consider a total contact cast. Wound measures about the same. He had the same undermining area that I defined last week from a roughly 11-3. I remove this today 10/12; using silver alginate he is using the [place]. He comes in for a nurse visit hence we are applying [employer] twice a week. Measuring slightly better today and less notable drainage. Extensive debridement of the wound edge last time 10/18; using topical Keystone and silver alginate and a soft cast. Wound measurements about the same. Drainage was through his soft cast. We are changing this twice a week Tuesdays and Friday 10/25; comes in with moderate drainage. Still using Keystone silver alginate and a soft cast. Wound dimensions completely the same.He has a lot of edema in the left leg he has lymphedema. Asking for [name] to consider wrapping him as he cannot get his stocking on over the soft cast 11/2; comes in with moderate to large drainage slightly smaller in terms of width we have been using [place]. His wound looks satisfactory but not much improvement 11/4; patient presents today for obligatory cast change. Has no issues or complaints today. He denies signs of  infection. 11/9; patient traveled this weekend to [place], was on the cast quite a bit. Staining of the cast with black material from his walking boot. Drainage was not quite as bad as we feared. Using silver alginate and Keystone 11/16; we do not have size for cast therefore we have been putting a soft cast on him since the change on Friday. Still a significant amount of drainage necessitating changing twice a week. We have been using the Keystone at cast changes either hard or soft as well as silver alginate Comes in the clinic with things actually looking fairly good improvement in width. He says his  offloading is about the same 02/24/2021 upon evaluation today patient actually comes back in and is doing excellent in regard to his foot ulcer this is significantly smaller even compared to the last visit. The soft cast seems to have done extremely well for him which is great news. I do not see any signs of infection minimal debridement will be needed today. 11/30; left lateral foot much improved half a centimeter improvement in surface area. No evidence of infection. He seems to be doing better with the soft cast in the TCC therefore we will continue with this. He comes back in later in the week for a change with the nurses. This is due to drainage 12/6; no improvement in dimensions. Under illumination some debris on the surface we have been using silver alginate, soft cast. If there is anything optimistic here he seems to have have less drainage 12/13. Dimensions are improved both length and width and slightly in depth. Appears to be quite healthy today. Raised edges of this thick skin and callus around the edges however. He is in a soft cast were bringing him back once for a change on Friday. Drainage is better 12/20. Dimensions are improved. He still has raised edges of thick skin and callus around the edges. We are using a soft cast 12/28; comes in today with thick callus around the wound. Using  silver under alginate under a soft cast. I do not think there is much improvement in any measurement 2023 04/06/2021; patient was put in a total contact cast. Unfortunately not much change in surface area 1/10; not much different still thick callus and skin around the edge in spite of the total contact cast. This was just debrided last week we have been using the [facility] compounded antibiotic and silver alginate under a total contact cast 1/18 the patient's wound on the left side is doing nicely. smaller HOWEVER he comes in today with a wound on the right foot laterally. blister most likely serosangquenous drainage 1/24; the patient continues to do well in terms of the plantar left foot which is continued to contract using silver alginate under the total contact cast HOWEVER the right lateral foot is bigger with denuded skin around the edges. I used pickups and a #15 scalpel to remove this this looks like the remanence of a large blister. Cannot rule out infection. Culture in this area I did last week showed Staphylococcus lugdunensis few colonies. I am going to try to address this with his [employer] antibiotic that is done so well on the left having linezolid and this should cover the staph 2/1; the patient's wound on his left foot which was the original plantar foot wound thick skin and eschar around the edges even in the total contact cast but the wound surface does not look too bad The real problem is on how his right lateral foot at roughly the base of the fifth metatarsal. The wound is completely necrotic more worrisome than that there is swelling around the edges of this. We have been using silver alginate on both wounds and Keystone on the right foot. Unfortunately I think he is going to require systemic antibiotics while we await cultures. He did not get the x-ray done that we ordered last week [lost the prescription 2/7; disappointingly in the area on the left foot which we are treating with  a total contact cast is still not closed although it is much smaller. He continues to have a lot of callus around the wound  edge. -Right lateral foot culture I did last week was negative x-ray also negative for osteomyelitis. 2/15: TCC silver alginate on the left and silver alginate on the right lateral. No real improvement in either area Objective Constitutional Patient is hypertensive.. Pulse regular and within target range for patient.Marland Kitchen Respirations regular, non-labored and within target range.. Temperature is normal and within the target range for the patient.Marland Kitchen Appears in no distress. Vitals Time Taken: 8:25 AM, Height: 77 in, Source: Stated, Weight: 280 lbs, Source: Stated, BMI: 33.2, Temperature: 98.4 F, Pulse: 94 bpm, Respiratory Rate: 18 breaths/min, Blood Pressure: 135/95 mmHg, Capillary Blood Glucose: 90 mg/dl. General Notes: glucose per pt report this am General Notes: left foot significant undermining. debrideed with 3 currette. right foot, not viable surface, debrided bleeds very easilyj Integumentary (Hair, Skin) Wound #3 status is Open. Original cause of wound was Trauma. The date acquired was: 10/02/2019. The wound has been in treatment 81 weeks. The wound is located on the Left,Lateral Foot. The wound measures 0.4cm length x 0.7cm width x 0.2cm depth; 0.22cm^2 area and 0.044cm^3 volume. There is Fat Layer (Subcutaneous Tissue) exposed. There is no tunneling or undermining noted. There is a small amount of serosanguineous drainage noted. The wound margin is thickened. There is large (67-100%) red granulation within the wound bed. There is a small (1-33%) amount of necrotic tissue within the wound bed including Adherent Slough. Wound #3 status is Open. Original cause of wound was Trauma. The date acquired was: 10/02/2019. The wound has been in treatment 81 weeks. The wound is located on the Left,Lateral Foot. The wound measures 0.4cm length x 0.7cm width x 0.2cm depth; 0.22cm^2 area  and 0.044cm^3 volume. There is a medium amount of serosanguineous drainage noted. Wound #8 status is Open. Original cause of wound was Blister. The date acquired was: 04/18/2021. The wound has been in treatment 4 weeks. The wound is located on the Right,Lateral Foot. The wound measures 3.2cm length x 2.8cm width x 0.1cm depth; 7.037cm^2 area and 0.704cm^3 volume. There is Fat Layer (Subcutaneous Tissue) exposed. There is no tunneling or undermining noted. There is a medium amount of serosanguineous drainage noted. The wound margin is well defined and not attached to the wound base. There is small (1-33%) red, friable granulation within the wound bed. There is a large (67-100%) amount of necrotic tissue within the wound bed including Eschar and Adherent Slough. 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 There was a Excisional Skin/Subcutaneous Tissue Debridement with a total area of 0.8 sq cm performed by [name], MD. With the following instrument(s): Curette to remove Viable and Non-Viable tissue/material. Material removed includes Callus, Subcutaneous Tissue, and Skin: Epidermis. No specimens were taken. A time out was conducted at 09:00, prior to the start of the procedure. A Minimum amount of bleeding was controlled with Pressure. The procedure was tolerated well with a pain level of 0 throughout and a pain level of 0 following the procedure. Post Debridement Measurements: 0.8cm length x 1cm width x 0.2cm depth; 0.126cm^3 volume. Character of Wound/Ulcer Post Debridement is improved. Severity  of Tissue Post Debridement is: Fat layer exposed. Post procedure Diagnosis Wound #  3: Same as Pre-Procedure Pre-procedure diagnosis of Wound #3 is a Diabetic Wound/Ulcer of the Lower Extremity located on the Left,Lateral Foot . There was a T Contact Cast otal Procedure by [name], MD. 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 There was a Excisional Skin/Subcutaneous Tissue Debridement with a total area of 8.96 sq cm performed by [name], MD. With the following instrument(s): Curette to remove Viable and Non-Viable tissue/material. Material removed includes Callus, Subcutaneous Tissue, and Skin: Epidermis. No specimens were taken. A time out was conducted at 09:00, prior to the start of the procedure. A Minimum amount of bleeding was controlled with Pressure. The procedure was tolerated well with a pain level of 0 throughout and a pain level of 0 following the procedure. Post Debridement Measurements: 3.2cm length x 3.2cm width x 0.1cm depth; 0.804cm^3 volume. Character of Wound/Ulcer Post Debridement is improved. Severity of Tissue Post Debridement is: Fat layer exposed. Post procedure Diagnosis Wound #8: Same as Pre-Procedure Plan Follow-up Appointments: Return Appointment in 1 week. - Dr. Dellia Nims room 1 Bathing/ Shower/ Hygiene: May shower with protection but do not get wound dressing(s) wet. - Use Cast Protector Bag Edema Control - Lymphedema / SCD / Other: Elevate legs to the level of the heart or above for 30 minutes daily and/or when sitting, a frequency of: - throughout the day Avoid standing for long periods of time. Exercise regularly Moisturize legs daily. - right leg every night before bed. Compression stocking or Garment 20-30 mm/Hg pressure to: - Apply to right  leg in the morning and remove at night. Off-Loading: T Contact Cast to Left Lower Extremity otal Open toe surgical shoe to: - right foot Other: - minimal weight bearing left foot Additional Orders / Instructions: Follow Nutritious Diet WOUND #3: - Foot Wound Laterality: Left, Lateral Cleanser: Soap and Water 1 x Per Week/7 Days Discharge Instructions: May shower and wash wound with dial antibacterial soap and water prior to dressing change. Topical: keystone 1 x Per Week/7 Days Prim Dressing: Hydrofera Blue Classic Foam, 2x2 in 1 x Per Week/7 Days ary Discharge Instructions: Moisten with saline prior to applying to wound bed Secondary Dressing: Woven Gauze Sponge, Non-Sterile 4x4 in 1 x Per Week/7 Days Discharge Instructions: Apply over primary dressing as directed. Secondary Dressing: Optifoam Non-Adhesive Dressing, 4x4 in 1 x Per Week/7 Days Discharge Instructions: Apply over primary dressing as directed. Com pression Wrap: Kerlix Roll 4.5x3.1 (in/yd) 1 x Per Week/7 Days Discharge Instructions: Apply Kerlix and Coban compression as directed. Com pression Wrap: Coban Self-Adherent Wrap 4x5 (in/yd) 1 x Per Week/7 Days Discharge Instructions: Apply over Kerlix as directed. WOUND #8: - Foot Wound Laterality: Right, Lateral Cleanser: Soap and Water Every Other Day/15 Days Discharge Instructions: May shower and wash wound with dial antibacterial soap and water prior to dressing change. Topical: keystone Every Other Day/15 Days Prim Dressing: KerraCel Ag Gelling Fiber Dressing, 4x5 in (silver alginate) (Generic) Every Other Day/15 Days ary Discharge Instructions: Apply silver alginate to wound bed as instructed Secondary Dressing: Woven Gauze Sponge, Non-Sterile 4x4 in (Generic) Every Other Day/15 Days Discharge Instructions: Apply over primary dressing as directed. Secondary Dressing: Optifoam Non-Adhesive Dressing, 4x4 in (Generic) Every Other Day/15 Days Discharge Instructions: Apply  over primary dressing as directed. Secured With: Coban Self-Adherent Wrap 4x5 (in/yd) (Generic) Every Other  Day/15 Days Discharge Instructions: Secure with Coban as directed. Secured With: [employer], 4.5x3.1 (in/yd) (Generic) Every Other Day/15 Days Discharge Instructions: Secure with Kerlix as directed. Secured With: 63M Medipore H Soft Cloth Surgical T ape, 4 x 10 (in/yd) (Generic) Every Other Day/15 Days Discharge Instructions: Secure with tape as directed. 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 disappointing 2still silver alginate on the right Electronic Signature(s) Signed: 05/19/2021 4:36:32 PM By: [name] MD Entered By: [name] on 05/19/2021 09:46:53 -------------------------------------------------------------------------------- Total Contact Cast Details Patient Name: Date of Service: [name], [name] 05/19/2021 8:30 A M Medical Record Number: [id number] Patient Account Number: [id number] Date of Birth/Sex: Treating RN: 22-Jan-1986 (36 y.o. M) Primary Care Provider: [name] Other Clinician: Referring Provider: Treating Provider/Extender: [name] in Treatment: 81 T Contact Cast Applied for Wound Assessment: otal Wound #3 Left,Lateral Foot Performed By: Physician [name], MD Post Procedure Diagnosis Same as Pre-procedure Electronic Signature(s) Signed: 05/19/2021 4:36:32 PM By: [name] MD Entered By: [name] on 05/19/2021 09:40:09 -------------------------------------------------------------------------------- SuperBill Details Patient Name: Date of Service: [name], [name] 05/19/2021 Medical Record Number: [id number] Patient Account Number: [id number] Date of Birth/Sex: Treating RN: [birth year] (36 y.o. 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

## 2021-05-26 ENCOUNTER — Other Ambulatory Visit: Payer: Self-pay

## 2021-05-26 ENCOUNTER — Encounter (HOSPITAL_BASED_OUTPATIENT_CLINIC_OR_DEPARTMENT_OTHER): Payer: BC Managed Care – PPO | Admitting: General Surgery

## 2021-05-26 DIAGNOSIS — E11621 Type 2 diabetes mellitus with foot ulcer: Secondary | ICD-10-CM | POA: Diagnosis not present

## 2021-05-26 NOTE — Progress Notes (Signed)
[name] ([id number]) Visit Report for 05/26/2021 Arrival Information Details Patient Name: Date of Service: [name] 05/26/2021 7:30 A M Medical Record Number: [id number] Patient Account Number: [id number] Date of Birth/Sex: Treating RN: 12-21-1985 (36 y.o. [name] Primary Care Jensen Cheramie: [name] Other Clinician: Referring Tomasz Steeves: Treating Omarius Grantham/Extender: [name] in Treatment: 76 Visit Information History Since Last Visit Added or deleted any medications: No Patient Arrived: Ambulatory Any new allergies or adverse reactions: No Arrival Time: 07:46 Had a fall or experienced change in No Accompanied By: self activities of daily living that may affect Transfer Assistance: None risk of falls: Patient Identification Verified: Yes Signs or symptoms of abuse/neglect since last visito No Secondary Verification Process Completed: Yes Hospitalized since last visit: No Patient Requires Transmission-Based Precautions: No Implantable device outside of the clinic excluding No Patient Has Alerts: No cellular tissue based products placed in the center since last visit: Has Dressing in Place as Prescribed: Yes Has Compression in Place as Prescribed: Yes Has Footwear/Offloading in Place as Prescribed: Yes Left: T Contact Cast otal Pain Present Now: No Electronic Signature(s) Signed: 05/26/2021 5:17:37 PM By: Baruch Gouty RN, BSN Entered By: Baruch Gouty on 05/26/2021 07:47:03 -------------------------------------------------------------------------------- Lower Extremity Assessment Details Patient Name: Date of Service: [name], [name] [name] 05/26/2021 7:30 A M Medical Record Number: [id number] Patient Account Number: [id number] Date of Birth/Sex: Treating RN: 05/20/1985 (36 y.o. [name] Primary Care [name]: [name] Other Clinician: Referring Rohith Fauth: Treating Waylan Busta/Extender: [name] in Treatment: 82 Edema Assessment Assessed: [Left: No] [Right: No] Edema: [Left: Yes] [Right: Yes] Calf Left: Right: Point of Measurement: 48 cm From Medial Instep 48 cm 48.5 cm Ankle Left: Right: Point of Measurement: 11 cm From Medial Instep 28.5 cm 29 cm Vascular Assessment Pulses: Dorsalis Pedis Palpable: [Left:Yes] [Right:Yes] Electronic Signature(s) Signed: 05/26/2021 5:17:37 PM By: Baruch Gouty RN, BSN Entered By: Baruch Gouty on 05/26/2021 08:04:41 -------------------------------------------------------------------------------- Multi Wound Chart Details Patient Name: Date of Service: [name], [name] [name] 05/26/2021 7:30 A M Medical Record Number: [id number] Patient Account Number: [id number] Date of Birth/Sex: Treating RN: 13-Apr-1985 (36 y.o. [name] Primary Care Mikal Blasdell: [name] Other Clinician: Referring [name]: Treating Otilio Groleau/Extender: [name] in Treatment: 25 Vital Signs Height(in): 77 Capillary Blood Glucose(mg/dl): 132 Weight(lbs): 280 Pulse(bpm): 94 Body Mass Index(BMI): 33.2 Blood Pressure(mmHg): 143/88 Temperature(F): 98.3 Respiratory Rate(breaths/min): 18 Photos: [N/A:N/A] Left, Lateral Foot Right, Lateral Foot N/A Wound Location: Trauma Blister N/A Wounding Event: Diabetic Wound/Ulcer of the Lower Diabetic Wound/Ulcer of the Lower N/A Primary Etiology: Extremity Extremity Type II Diabetes Type II Diabetes N/A Comorbid History: 10/02/2019 04/18/2021 N/A Date Acquired: 82 5 N/A Weeks of Treatment: Open Open N/A Wound Status: No No N/A Wound Recurrence: 0.9x1.2x0.1 3.8x3x0.1 N/A Measurements L x W x [name] 0.848 8.954 N/A A (cm) : rea 0.085 0.895 N/A Volume (cm) : 48.60% -1528.00% N/A % Reduction in A rea: 48.50% -713.60% N/A % Reduction in Volume: Grade 2 Grade 2 N/A Classification: Medium Medium N/A Exudate A mount: Serosanguineous  Serosanguineous N/A Exudate Type: red, brown red, brown N/A Exudate Color: Thickened Well defined, not attached N/A Wound Margin: Large (67-100%) Medium (34-66%) N/A Granulation A mount: Red Red, Friable N/A Granulation Quality: None Present (0%) Medium (34-66%) N/A Necrotic A mount: N/A Eschar, Adherent Slough N/A Necrotic Tissue: Fat Layer (Subcutaneous Tissue): Yes Fat Layer (Subcutaneous Tissue): Yes N/A Exposed Structures: Fascia: No Fascia: No Tendon: No Tendon: No  Muscle: No Muscle: No Joint: No Joint: No Bone: No Bone: No Small (1-33%) None N/A Epithelialization: Debridement - Excisional Debridement - Excisional N/A Debridement: Pre-procedure Verification/Time Out 08:30 08:30 N/A Taken: Necrotic/Eschar, Callus, Necrotic/Eschar, Callus, N/A Tissue Debrided: Subcutaneous, Slough Subcutaneous, Slough Skin/Subcutaneous Tissue Skin/Subcutaneous Tissue N/A Level: 1.08 11.4 N/A Debridement A (sq cm): rea Curette Curette N/A Instrument: Minimum Minimum N/A Bleeding: Silver Nitrate Silver Nitrate N/A Hemostasis Achieved: 0 0 N/A Procedural Pain: 0 0 N/A Post Procedural Pain: Procedure was tolerated well Procedure was tolerated well N/A Debridement Treatment Response: 0.9x1.2x0.1 3.8x3x0.1 N/A Post Debridement Measurements L x W x [name] 0.085 0.895 N/A Post Debridement Volume: (cm) Debridement Debridement N/A Procedures Performed: T Contact Cast otal Treatment Notes Electronic Signature(s) Signed: 05/26/2021 8:43:13 AM By: [name] MD, FACS Signed: 05/26/2021 5:17:37 PM By: Baruch Gouty RN, BSN Entered By: [name] on 05/26/2021 08:43:13 -------------------------------------------------------------------------------- Multi-Disciplinary Care Plan Details Patient Name: Date of Service: [name], [name] [name] 05/26/2021 7:30 A M Medical Record Number: [id number] Patient Account Number: [id number] Date of Birth/Sex: Treating  RN: Jul 04, 1985 (36 y.o. [name] Primary Care Elba Dendinger: [name] Other Clinician: Referring Gaelle Adriance: Treating Layloni Fahrner/Extender: [name] in Treatment: 35 Multidisciplinary Care Plan reviewed with physician Active Inactive Nutrition Nursing Diagnoses: Imbalanced nutrition Potential for alteratiion in Nutrition/Potential for imbalanced nutrition Goals: Patient/caregiver agrees to and verbalizes understanding of need to use nutritional supplements and/or vitamins as prescribed Date Initiated: 10/24/2019 Date Inactivated: 04/06/2020 Target Resolution Date: 04/03/2020 Goal Status: Met Patient/caregiver will maintain therapeutic glucose control Date Initiated: 10/24/2019 Target Resolution Date: 06/11/2021 Goal Status: Active Interventions: Assess HgA1c results as ordered upon admission and as needed Assess patient nutrition upon admission and as needed per policy Provide education on elevated blood sugars and impact on wound healing Provide education on nutrition Treatment Activities: Education provided on Nutrition : 12/02/2020 Notes: 11/17/20: Glucose control ongoing issue, target date extended. 01/26/21: Glucose management continues. Wound/Skin Impairment Nursing Diagnoses: Impaired tissue integrity Knowledge deficit related to ulceration/compromised skin integrity Goals: Patient/caregiver will verbalize understanding of skin care regimen Date Initiated: 10/24/2019 Target Resolution Date: 06/11/2021 Goal Status: Active Ulcer/skin breakdown will have a volume reduction of 30% by week 4 Date Initiated: 10/24/2019 Date Inactivated: 01/16/2020 Target Resolution Date: 01/10/2020 Unmet Reason: no change in Goal Status: Unmet measurements. Interventions: Assess patient/caregiver ability to obtain necessary supplies Assess patient/caregiver ability to perform ulcer/skin care regimen upon admission and as needed Assess ulceration(s) every  visit Provide education on ulcer and skin care Notes: 11/17/20: Wound care regimen continues Electronic Signature(s) Signed: 05/26/2021 5:17:37 PM By: Baruch Gouty RN, BSN Entered By: Baruch Gouty on 05/26/2021 08:19:02 -------------------------------------------------------------------------------- Pain Assessment Details Patient Name: Date of Service: [name], [name] [name] 05/26/2021 7:30 A M Medical Record Number: [id number] Patient Account Number: [id number] Date of Birth/Sex: Treating RN: 1985-09-08 (36 y.o. [name] Primary Care Taren Toops: [name] Other Clinician: Referring Karry Causer: Treating Mikalyn Hermida/Extender: [name] in Treatment: 93 Active Problems Location of Pain Severity and Description of Pain Patient Has Paino No Site Locations Rate the pain. Current Pain Level: 0 Pain Management and Medication Current Pain Management: Electronic Signature(s) Signed: 05/26/2021 5:17:37 PM By: Baruch Gouty RN, BSN Entered By: Baruch Gouty on 05/26/2021 07:48:58 -------------------------------------------------------------------------------- Patient/Caregiver Education Details Patient Name: Date of Service: [name], [name] [name] 2/22/2023andnbsp7:30 A M Medical Record Number: [id number] Patient Account Number: [id number] Date of Birth/Gender: Treating RN: 1985-05-09 (36 y.o. [name] Primary Care Physician: [name]  Other Clinician: Referring Physician: Treating Physician/Extender: [name] in Treatment: 62 Education Assessment Education Provided To: Patient Education Topics Provided Elevated Blood Sugar/ Impact on Healing: Methods: Explain/Verbal Responses: Reinforcements needed, State content correctly Offloading: Methods: Explain/Verbal Responses: Reinforcements needed, State content correctly Wound/Skin Impairment: Methods: Explain/Verbal Responses: Reinforcements  needed, State content correctly Electronic Signature(s) Signed: 05/26/2021 5:17:37 PM By: Baruch Gouty RN, BSN Entered By: Baruch Gouty on 05/26/2021 08:20:56 -------------------------------------------------------------------------------- Wound Assessment Details Patient Name: Date of Service: [name], [name] [name] 05/26/2021 7:30 A M Medical Record Number: [id number] Patient Account Number: [id number] Date of Birth/Sex: Treating RN: 1985/06/28 (36 y.o. M) Primary Care Tyjae Issa: [name] Other Clinician: Referring Sabirin Baray: Treating Cassey Bacigalupo/Extender: [name] in Treatment: 82 Wound Status Wound Number: 3 Primary Etiology: Diabetic Wound/Ulcer of the Lower Extremity Wound Location: Left, Lateral Foot Wound Status: Open Wounding Event: Trauma Comorbid History: Type II Diabetes Date Acquired: 10/02/2019 Weeks Of Treatment: 82 Clustered Wound: No Photos Wound Measurements Length: (cm) 0.9 Width: (cm) 1.2 Depth: (cm) 0.1 Area: (cm) 0.848 Volume: (cm) 0.085 % Reduction in Area: 48.6% % Reduction in Volume: 48.5% Epithelialization: Small (1-33%) Tunneling: No Undermining: No Wound Description Classification: Grade 2 Wound Margin: Thickened Exudate Amount: Medium Exudate Type: Serosanguineous Exudate Color: red, brown Wound Bed Granulation Amount: Large (67-100%) Exposed Structure Granulation Quality: Red Fascia Exposed: No Necrotic Amount: None Present (0%) Fat Layer (Subcutaneous Tissue) Exposed: Yes Tendon Exposed: No Muscle Exposed: No Joint Exposed: No Bone Exposed: No Electronic Signature(s) Signed: 05/26/2021 8:22:14 AM By: Sandre Kitty Entered By: Sandre Kitty on 05/26/2021 08:11:07 -------------------------------------------------------------------------------- Wound Assessment Details Patient Name: Date of Service: [name] 05/26/2021 7:30 A M Medical Record Number: [id number] Patient Account  Number: [id number] Date of Birth/Sex: Treating RN: 04/20/1985 (36 y.o. M) Primary Care Kayli Beal: [name] Other Clinician: Referring Tayelor Osborne: Treating Shakeel Disney/Extender: [name] in Treatment: 82 Wound Status Wound Number: 8 Primary Etiology: Diabetic Wound/Ulcer of the Lower Extremity Wound Location: Right, Lateral Foot Wound Status: Open Wounding Event: Blister Comorbid History: Type II Diabetes Date Acquired: 04/18/2021 Weeks Of Treatment: 5 Clustered Wound: No Photos Wound Measurements Length: (cm) 3.8 Width: (cm) 3 Depth: (cm) 0.1 Area: (cm) 8.954 Volume: (cm) 0.895 % Reduction in Area: -1528% % Reduction in Volume: -713.6% Epithelialization: None Tunneling: No Undermining: No Wound Description Classification: Grade 2 Wound Margin: Well defined, not attached Exudate Amount: Medium Exudate Type: Serosanguineous Exudate Color: red, brown Foul Odor After Cleansing: No Slough/Fibrino Yes Wound Bed Granulation Amount: Medium (34-66%) Exposed Structure Granulation Quality: Red, Friable Fascia Exposed: No Necrotic Amount: Medium (34-66%) Fat Layer (Subcutaneous Tissue) Exposed: Yes Necrotic Quality: Eschar, Adherent Slough Tendon Exposed: No Muscle Exposed: No Joint Exposed: No Bone Exposed: No Electronic Signature(s) Signed: 05/26/2021 8:22:14 AM By: Sandre Kitty Entered By: Sandre Kitty on 05/26/2021 08:13:22 -------------------------------------------------------------------------------- Vitals Details Patient Name: Date of Service: [name], [name] [name] 05/26/2021 7:30 A M Medical Record Number: [id number] Patient Account Number: [id number] Date of Birth/Sex: Treating RN: 11/26/1985 (36 y.o. [name] Primary Care Maribell Demeo: [name] Other Clinician: Referring Baird Polinski: Treating Loreena Valeri/Extender: [name] in Treatment: 67 Vital Signs Time Taken: 07:47 Temperature  (F): 98.3 Height (in): 77 Pulse (bpm): 94 Source: Stated Respiratory Rate (breaths/min): 18 Weight (lbs): 280 Blood Pressure (mmHg): 143/88 Source: Stated Capillary Blood Glucose (mg/dl): 132 Body Mass Index (BMI): 33.2 Reference Range: 80 - 120 mg / dl Notes glucose per pt report this am Electronic Signature(s)  Signed: 05/26/2021 5:17:37 PM By: Baruch Gouty RN, BSN Entered By: Baruch Gouty on 05/26/2021 07:48:38

## 2021-05-26 NOTE — Progress Notes (Addendum)
[name] ([id number]) Visit Report for 05/26/2021 Chief Complaint Document Details Patient Name: Date of Service: [name] D 05/26/2021 7:30 Max M Medical Record Number: [id number] Patient Account Number: [id number] Date of Birth/Sex: Treating RN: 1985-06-25 (36 y.o. [name] Primary Care Provider: [name] Other Clinician: Referring Provider: Treating Provider/Extender: [name] in Treatment: 52 Information Obtained from: Patient Chief Complaint 01/11/2019; patient is here for review of Max rather substantial wound over the left fifth plantar metatarsal head extending into the lateral part of his foot 10/24/2019; patient returns to clinic with wounds on his bilateral feet with underlying osteomyelitis biopsy-proven Electronic Signature(s) Signed: 05/26/2021 8:44:07 AM By: [name] MD, FACS Entered By: [name] on 05/26/2021 08:44:07 -------------------------------------------------------------------------------- Debridement Details Patient Name: Date of Service: [name], [name] 05/26/2021 7:30 Max M Medical Record Number: [id number] Patient Account Number: [id number] Date of Birth/Sex: Treating RN: [birth year] (36 y.o. [name] Primary Care Provider: [name] Other Clinician: Referring Provider: Treating Provider/Extender: [name] in Treatment: 82 Debridement Performed for Assessment: Wound #8 Right,Lateral Foot Performed By: Physician [name], MD Debridement Type: Debridement Severity of Tissue Pre Debridement: Fat layer exposed Level of Consciousness (Pre-procedure): Awake and Alert Pre-procedure Verification/Time Out Yes - 08:30 Taken: Start Time: 08:31 T Area Debrided (L x W): otal 3.8 (cm) x 3 (cm) = 11.4 (cm) Tissue and other material debrided: Viable, Non-Viable, Callus, Eschar, Slough, Subcutaneous, Skin: Epidermis, Slough Level: Skin/Subcutaneous  Tissue Debridement Description: Excisional Instrument: Curette Bleeding: Minimum Hemostasis Achieved: Silver Nitrate Procedural Pain: 0 Post Procedural Pain: 0 Response to Treatment: Procedure was tolerated well Level of Consciousness (Post- Awake and Alert procedure): Post Debridement Measurements of Total Wound Length: (cm) 3.8 Width: (cm) 3 Depth: (cm) 0.1 Volume: (cm) 0.895 Character of Wound/Ulcer Post Debridement: Improved Severity of Tissue Post Debridement: Fat layer exposed Post Procedure Diagnosis Same as Pre-procedure Electronic Signature(s) Signed: 05/26/2021 9:54:39 AM By: [name] MD, FACS Signed: 05/26/2021 5:17:37 PM By: Baruch Gouty RN, BSN Entered By: Baruch Gouty on 05/26/2021 08:34:27 -------------------------------------------------------------------------------- Debridement Details Patient Name: Date of Service: [name], [name] 05/26/2021 7:30 Max M Medical Record Number: [id number] Patient Account Number: [id number] Date of Birth/Sex: Treating RN: 1986/03/24 (36 y.o. [name] Primary Care Provider: [name] Other Clinician: Referring Provider: Treating Provider/Extender: [name] in Treatment: 82 Debridement Performed for Assessment: Wound #3 Left,Lateral Foot Performed By: Physician [name], MD Debridement Type: Debridement Severity of Tissue Pre Debridement: Fat layer exposed Level of Consciousness (Pre-procedure): Awake and Alert Pre-procedure Verification/Time Out Yes - 08:30 Taken: Start Time: 08:31 T Area Debrided (L x W): otal 0.9 (cm) x 1.2 (cm) = 1.08 (cm) Tissue and other material debrided: Viable, Non-Viable, Callus, Eschar, Slough, Subcutaneous, Skin: Epidermis, Slough Level: Skin/Subcutaneous Tissue Debridement Description: Excisional Instrument: Curette Bleeding: Minimum Hemostasis Achieved: Silver Nitrate Procedural Pain: 0 Post Procedural Pain: 0 Response  to Treatment: Procedure was tolerated well Level of Consciousness (Post- Awake and Alert procedure): Post Debridement Measurements of Total Wound Length: (cm) 0.9 Width: (cm) 1.2 Depth: (cm) 0.1 Volume: (cm) 0.085 Character of Wound/Ulcer Post Debridement: Improved Severity of Tissue Post Debridement: Fat layer exposed Post Procedure Diagnosis Same as Pre-procedure Electronic Signature(s) Signed: 05/26/2021 9:54:39 AM By: [name] MD, FACS Signed: 05/26/2021 5:17:37 PM By: Baruch Gouty RN, BSN Entered By: Baruch Gouty on 05/26/2021 08:38:18 -------------------------------------------------------------------------------- HPI Details Patient Name: Date of Service: [name], [name] 05/26/2021 7:30 Max  M Medical Record Number: [id number] Patient Account Number: [id number] Date of Birth/Sex: Treating RN: 08-Jun-1985 (36 y.o. 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 He comes in for review of Max wound over the plantar fifth metatarsal head extending into the lateral part of the foot. He was followed for this previously by his podiatrist Dr. Cornelius Moras. As the patient tells his story he went to see podiatry first for Max swelling he developed on the lateral part of his fifth metatarsal head in May. He states this was "open" by podiatry and the area closed. He was followed up in June and it was again opened callus removed and it closed promptly. There were plans being made for surgery on the fifth metatarsal head in June however his blood sugar was apparently too high for anesthesia. Apparently the area was debrided and opened again in June and it is never closed since. Looking over the records from  podiatry I am really not able to follow this. It was clear when he was first seen it was before 5/14 at that point he already had Max wound. By 5/17 the ulcer was resolved. I do not see anything about Max procedure. On 5/28 noted to have pre-ulcerative moderate keratosis. X-ray noted 1/5 contracted toe and tailor's bunion and metatarsal deformity. On Max visit date on 09/28/2018 the dorsal part of the left foot it healed and resolved. There was concern about swelling in his lower extremity he was sent to the ER.. As far as I can tell he was seen in the ER on 7/12 with an ulcer on his left foot. Max DVT rule out of the left leg was negative. I do not think I have complete records from podiatry but I am not able to verify the procedures this patient states he had. He states after the last procedure the wound has never closed although I am not able to follow this in the records I have from podiatry. He has not had Max recent x-ray The patient has been using Neosporin on the wound. He is wearing Max Darco shoe. He is still very active up on his foot working and exercising. Past medical history; type 2 diabetes ketosis-prone, leg swelling with Max negative DVT study in July. Non-smoker ABI in our clinic was 0.85 on the left 10/16; substantial wound on the plantar left fifth met head extending laterally almost to the dorsal fifth MTP. We have been using silver alginate we gave him Max Darco forefoot off loader. An x-ray did not show evidence of osteomyelitis did note soft tissue emphysema which I think was due to gas tracking through an open wound. There is no doubt in my mind he requires an MRI 10/23; MRI not booked until 3 November at the earliest this is largely due to his glucose sensor in the right arm. We have been using silver alginate. There has been an improvement 10/29; I am still not exactly sure when his MRI is booked for. He says it is the third but it is the 10th in epic. This definitely needs to be done. He  is running Max low-grade fever today but no other symptoms. No real improvement in the 1 02/26/2019 patient presents today for Max follow-up visit here in our clinic he is last been seen  in the clinic on October 29. Subsequently we were working on getting MRI to evaluate and see what exactly was going on and where we would need to go from the standpoint of whether or not he had osteomyelitis and again what treatments were going be required. Subsequently the patient ended up being admitted to the hospital on 02/07/2019 and was discharged on 02/14/2019. This is Max somewhat interesting admission with Max discharge diagnosis of pneumonia due to COVID-19 although he was positive for COVID-19 when tested at the urgent care but negative x2 when he was actually in the hospital. With that being said he did have acute respiratory failure with hypoxia and it was noted he also have Max left foot ulceration with osteomyelitis. With that being said he did require oxygen for his pneumonia and I level 4 L. He was placed on antivirals and steroids for the COVID-19. He was also transferred to the [facility] at one point. Nonetheless he did subsequently discharged home and since being home has done much better in that regard. The CT angiogram did not show any pulmonary embolism. With regard to the osteomyelitis the patient was placed on vancomycin and Zosyn while in the hospital but has been changed to Augmentin at discharge. It was also recommended that he follow- up with wound care and podiatry. Podiatry however wanted him to see [name] according to the patient prior to them doing anything further. His hemoglobin A1c was 9.9 as noted in the hospital. Have an MRI of the left foot performed while in the hospital on 02/04/2019. 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 There is an overlying plantar open wound noted an abscess  tracking back along the lateral aspect of the fifth metatarsal shaft. There is otherwise diffuse cellulitis and mild fasciitis without findings of polymyositis. The patient did have recently pneumonia secondary to COVID-19 I looked in the chart through epic and it does appear that the patient may need to have an additional x-ray just to ensure everything is cleared and that he has no airspace disease prior to putting him into the [name]. 03/05/2019; patient was readmitted to the clinic last week. He was hospitalized twice for Max viral upper respiratory tract infection from 11/1 through 11/4 and then 11/5 through 11/12 ultimately this turned out to be Covid pneumonitis. Although he was discharged on oxygen he is not using it. He says he feels fine. He has no exercise limitation no cough no sputum. His O2 sat in our clinic today was 100% on room air. 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 He received Vanco and Zosyn in the hospital and then was discharged on 2 weeks of Augmentin. I do not see any relevant cultures. He was supposed to follow-up with infectious disease but I do not see that he has an appointment. 12/8; patient saw Dr. Novella Olive of infectious disease last week. He felt that he had had adequate antibiotic therapy. He did not go to follow-up with Dr. Amalia Hailey of podiatry and I have again talked to him about the pros and cons of this. He does not want to consider Max ray amputation of this time. He is aware of the risks of recurrence, migration etc. He started HBO today and tolerated this well. He can complete the Augmentin that I gave him last  week. I have looked over the lab work that Dr. Chana Bode ordered his C-reactive protein was 3.3 and his sedimentation rate was 17. The C-reactive protein is never really been measurably that high in this patient 12/15; not much change in the  wound today however he has undermining along the lateral part of the foot again more extensively than last week. He has some rims of epithelialization. We have been using silver alginate. He is undergoing hyperbarics but did not dive today 12/18; in for his obligatory first total contact cast change. Unfortunately there was pus coming from the undermining area around his fifth metatarsal head. This was cultured but will preclude reapplication of Max cast. He is seen in conjunction with HBO 12/24; patient had staph lugdunensis in the wound in the undermining area laterally last time. We put him on doxycycline which should have covered this. The wound looks better today. I am going to give him another week of doxycycline before reattempting the total contact cast 12/31; the patient is completing antibiotics. Hemorrhagic debris in the distal part of the wound with some undermining distally. He also had hyper granulation. Extensive debridement with Max #5 curette. The infected area that was on the lateral part of the fifth met head is closed over. I do not think he needs any more antibiotics. Patient was seen prior to HBO. Preparations for Max total contact cast were made in the cast will be placed post hyperbarics 04/11/19; once again the patient arrives today without complaint. He had been in Max cast all week noted that he had heavy drainage this week. This resulted in large raised areas of macerated tissue around the wound 1/14; wound bed looks better slightly smaller. Hydrofera Blue has been changing himself. He had Max heavy drainage last week which caused Max lot of maceration around the wound so I took him out of Max total contact cast he says the drainage is actually better this week He is seen today in conjunction with HBO 1/21; returns to clinic. He was up in [place] for Max day or 2 attending Max funeral. He comes back in with the wound larger and with Max large area of exposed bone. He had osteomyelitis and  septic arthritis of the fifth left metatarsal head while he was in hospital. He received IV antibiotics in the hospital for Max prolonged period of time then 3 weeks of Augmentin. Subsequently I gave him 2 weeks of doxycycline for more superficial wound infection. When I saw this last week the wound was smaller the surface of the wound looks satisfactory. 1/28; patient missed hyperbarics today. Bone biopsy I did last time showed Enterococcus faecalis and Staphylococcus lugdunensis . He has Max wide area of exposed bone. We are going to use silver alginate as of today. I had another ethical discussion with the patient. 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 In this situation I think the likelihood of healing this is low. Therefore I have recommended Max ray amputation and with the patient's agreement I have referred him to Dr. Doran Durand. The other issue is that his compliance with hyperbarics has been minimal because of his work schedule and given his underlying decision I am going to stop this today READMISSION 10/24/2019 MRI 09/29/2019 left foot IMPRESSION: 1. Apparent skin ulceration inferior and lateral to the 5th metatarsal base with underlying heterogeneous T2 signal and enhancement in the subcutaneous fat. Small peripherally enhancing fluid collections along the plantar and lateral aspects of the 5th metatarsal  base suspicious for abscesses. 2. Interval amputation through the mid 5th metatarsal with nonspecific low-level marrow edema and enhancement. Given the proximity to the adjacent soft tissue inflammatory changes, osteomyelitis cannot be excluded. 3. The additional bones appear unremarkable. MRI 09/29/2019 right foot IMPRESSION: 1. Soft tissue ulceration lateral to the 5th MTP joint. There is low-level T2 hyperintensity within the 4th and 5th metatarsal heads and adjacent proximal phalanges without abnormal T1 signal or cortical destruction. These  findings are nonspecific and could be seen with early marrow edema, hyperemia or early osteomyelitis. No evidence of septic joint. 2. Mild tenosynovitis and synovial enhancement associated with the extensor digitorum tendons at the level of the midfoot. 3. Diffuse low-level muscular T2 hyperintensity and enhancement, most consistent with diabetic myopathy. LEFT FOOT BONE Methicillin resistant staphylococcus aureus Staphylococcus lugdunensis MIC MIC CIPROFLOXACIN >=8 RESISTANT Resistant <=0.5 SENSI... Sensitive CLINDAMYCIN <=0.25 SENS... Sensitive >=8 RESISTANT Resistant ERYTHROMYCIN >=8 RESISTANT Resistant >=8 RESISTANT Resistant GENTAMICIN <=0.5 SENSI... Sensitive <=0.5 SENSI... Sensitive Inducible Clindamycin NEGATIVE Sensitive NEGATIVE Sensitive OXACILLIN >=4 RESISTANT Resistant 2 SENSITIVE Sensitive RIFAMPIN <=0.5 SENSI... Sensitive <=0.5 SENSI... Sensitive TETRACYCLINE <=1 SENSITIVE Sensitive <=1 SENSITIVE Sensitive TRIMETH/SULFA <=10 SENSIT Sensitive <=10 SENSIT Sensitive ... Marland Kitchen.. VANCOMYCIN 1 SENSITIVE Sensitive <=0.5 SENSI... Sensitive Right foot bone . Component 3 wk ago Specimen Description BONE Special Requests RIGHT 4 METATARSAL SAMPLE B Gram Stain NO WBC SEEN NO ORGANISMS SEEN Culture RARE METHICILLIN RESISTANT STAPHYLOCOCCUS AUREUS NO ANAEROBES ISOLATED Performed at [facility], [place] [address], [place], [place] [address] Report Status 10/08/2019 FINAL Organism ID, Bacteria METHICILLIN RESISTANT STAPHYLOCOCCUS AUREUS Resulting Agency [facility] Susceptibility Methicillin resistant staphylococcus aureus MIC CIPROFLOXACIN >=8 RESISTANT Resistant CLINDAMYCIN <=0.25 SENS... Sensitive ERYTHROMYCIN >=8 RESISTANT Resistant GENTAMICIN <=0.5 SENSI... Sensitive Inducible Clindamycin NEGATIVE Sensitive OXACILLIN >=4 RESISTANT Resistant RIFAMPIN <=0.5 SENSI... Sensitive TETRACYCLINE <=1 SENSITIVE Sensitive TRIMETH/SULFA <=10 SENSIT Sensitive ... VANCOMYCIN 1  SENSITIVE Sensitive This is Max patient we had in clinic earlier this year with Max wound over his left fifth metatarsal head. 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 Doran Durand on 05/16/2019. He seems to have developed problems with wounds on his bilateral feet in June 2021 although he may have had problems earlier than this. He was in an urgent care with Max right foot ulcer on 09/26/2019 and given Max course of doxycycline. This was apparently after having trouble getting into see orthopedics. He was seen by podiatry on 09/28/2019 noted to have bilateral lower extremity ulcers including the left lateral fifth metatarsal base and the right subfifth met head. It was noted that had purulent drainage at that time. He required hospitalization from 6/20 through 7/2. This was because of worsening right foot wounds. He underwent bilateral operative incision and drainage and bone biopsies bilaterally. Culture results are listed above. He has been referred back to clinic by Dr. Jacqualyn Posey of podiatry. He is also followed by Dr. Megan Salon who saw him yesterday. He was discharged from hospital on Zyvox Flagyl and Levaquin and yesterday changed to doxycycline Flagyl and Levaquin. His inflammatory markers on 6/26 showed Max sedimentation rate of 129 and Max C-reactive protein of 5. This is improved to 14 and 1.3 respectively. This would indicate improvement. ABIs in our clinic today were 1.23 on the right and  1.20 on the left 11/01/2019 on evaluation today patient appears to be doing fairly well in regard to the wounds on his feet at this point. Fortunately there is no signs of active infection at this time. No fevers, chills, nausea, vomiting, or  diarrhea. He currently is seeing infectious disease and still under their care at this point. Subsequently he also has both wounds which she has not been using collagen on as he did not receive that in his packaging he did not call us and let us know that. Apparently that just was missed on the order. Nonetheless we will get that straightened out today. 8/9-Patient returns for bilateral foot wounds, using Prisma with hydrogel moistened dressings, and the wounds appear stable. Patient using surgical shoes, avoiding much pressure or weightbearing as much as possible 8/16; patient has bilateral foot wounds. 1 on the right lateral foot proximally the other is on the left mid lateral foot. Both required debridement of callus and thick skin around the wounds. We have been using silver collagen 8/27; patient has bilateral lateral foot wounds. The area on the left substantially surrounded by callus and dry skin. This was removed from the wound edge. The underlying wound is small. The area on the right measured somewhat smaller today. 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 Unfortunately I did not update his antibiotics during today's visit. 9/10 I reviewed Dr. Hale Bogus last notes he felt he had completed antibiotics his inflammatory markers were reasonably well controlled. He has Max small wound on the lateral left foot and Max tiny area on the right which is just above closed. He is using Hydrofera Blue with border foam he has bilateral surgical shoes 9/24; 2 week f/u. doing well. right foot is closed. left foot still undermined. 10/14; right foot remains closed at the fifth met head. The area over the base of the left fifth metatarsal has Max small open area but considerable undermining towards the plantar foot. Thick callus skin around this suggests an adequate pressure relief. We have talked about this. He says he is going to go back into  his cam boot. I suggested Max total contact cast he did not seem enamored with this suggestion 10/26; left foot base of the fifth metatarsal. Same condition as last time. He has skin over the area with an open wound however the skin is not adherent. He went to see Dr. Earleen Newport who did an x-ray and culture of his foot I have not reviewed the x-ray but the patient was not told anything. He is on doxycycline 11/11; since the patient was last here he was in the emergency room on 10/30 he was concerned about swelling in the left foot. They did not do any cultures or x-rays. They changed his antibiotics to cephalexin. Previous culture showed group B strep. The cephalexin is appropriate as doxycycline has less than predictable coverage. Arrives in clinic today with swelling over this area under the wound. He also has Max new wound on the right fifth metatarsal head 11/18; the patient has Max difficult wound on the lateral aspect of the left fifth metatarsal head. The wound was almost ballotable last week I opened it slightly expecting to see purulence however there was just bleeding. I cultured this this was negative. X-ray unchanged. We are trying to get an MRI but I am not sure were going to be able to get this through his insurance. He also has an area  on the right lateral fifth metatarsal head this looks healthier 12/3; the patient finally got our MRI. Surprisingly this did not show osteomyelitis. I did show the soft tissue ulceration at the lateral plantar aspect of the fifth metatarsal base with Max tiny residual 6 mm abscess overlying the superficial fascia I have tried to culture this area I have not been able to get this to grow anything. Nevertheless the protruding tissue looks aggravated. I suspect we should try to treat the underlying "abscess with broad-spectrum antibiotics. I am going to start him on Levaquin and Flagyl. He has much less edema in his legs and I am going to continue to wrap his legs and  see him weekly 12/10. I started Levaquin and Flagyl on him last week. He just picked up the Flagyl apparently there was some delay. The worry is the wound on the left fifth metatarsal base which is substantial and worsening. His foot looks like he inverts at the ankle making this Max weightbearing surface. Certainly no improvement in fact I think the measurements of this are somewhat worse. We have been using 12/17; he apparently just got the Levaquin yesterday this is 2 weeks after the fact. He has completed the Flagyl. The area over the left fifth metatarsal base still has protruding granulation tissue although it does not look quite as bad as it did some weeks ago. He has severe bilateral lymphedema although we have not been treating him for wounds on his legs this is definitely going to require compression. There was so much edema in the left I did not wish to put him in Max total contact cast today. I am going to increase his compression from 3-4 layer. The area on the right lateral fifth met head actually look quite good and superficial. 12/23; patient arrived with callus on the right fifth met head and the substantial hyper granulated callused wound on the base of his fifth metatarsal. He says he is completing his Levaquin in 2 days but I do not think that adds up with what I gave him but I will have to double check this. We are using Hydrofera Blue on both areas. My plan is to put the left leg in Max cast the week after New Year's 04/06/2020; patient's wounds about the same. Right lateral fifth metatarsal head and left lateral foot over the base of the fifth metatarsal. There is undermining on the left lateral foot which I removed before application of total contact cast continuing with Hydrofera Blue new. Patient tells me he was seen by endocrinology today lab work was done [Dr. Kerr]. Also wondering whether he was referred to cardiology. I went over some lab work from previously does not have  chronic renal failure certainly not nephrotic range proteinuria he does have very poorly controlled diabetes but this is not his most updated lab work. Hemoglobin A1c has been over 11 1/10; the patient had Max considerable amount of leakage towards mid part of his left foot with macerated skin however the wound surface looks better the area on the right lateral fifth met head is better as well. I am going to change the dressing on the left foot under the total contact cast to silver alginate, continue with Hydrofera Blue on the right. 1/20; patient was in the total contact cast for 10 days. Considerable amount of drainage although the skin around the wound does not look too bad on the left foot. The area on the right fifth metatarsal head is closed.  Our nursing staff reports large amount of drainage out of the left lateral foot wound 1/25; continues with copious amounts of drainage described by our intake staff. PCR culture I did last week showed E. coli and Enterococcus faecalis and low quantities. Multiple resistance genes documented including extended spectrum beta lactamase, MRSA, MRSE, quinolone, tetracycline. The wound is not quite as good this week as it was 5 days ago but about the same size 2/3; continues with copious amounts of malodorous drainage per our intake nurse. The PCR culture I did 2 weeks ago showed E. coli and low quantities of Enterococcus. There were multiple resistance genes detected. I put Neosporin on him last week although this does not seem to have helped. The wound is slightly deeper today. Offloading continues to be an issue here although with the amount of drainage she has Max total contact cast is just not going to work 2/10; moderate amount of drainage. Patient reports he cannot get his stocking on over the dressing. I told him we have to do that the nurse gave him suggestions on how to make this work. The wound is on the bottom and lateral part of his left foot. Is  cultured predominantly grew low amounts of Enterococcus, E. coli and anaerobes. There were multiple resistance genes detected including extended spectrum beta lactamase, quinolone, tetracycline. I could not think of an easy oral combination to address this so for now I am going to do topical antibiotics provided by [facility] I think the main agents here are vancomycin and an aminoglycoside. We have to be able to give him access to the wounds to get the topical antibiotic on 2/17; moderate amount of drainage this is unchanged. He has his Keystone topical antibiotic against the deep tissue culture organisms. He has been using this and changing the dressing daily. Silver alginate on the wound surface. 2/24; using Keystone antibiotic with silver alginate on the top. He had too much drainage for Max total contact cast at one point although I think that is improving and I think in the next week or 2 it might be possible to replace Max total contact cast I did not do this today. In general the wound surface looks healthy however he continues to have thick rims of skin and subcutaneous tissue around the wide area of the circumference which I debrided 06/04/2020 upon evaluation today patient appears to be doing well in regard to his wound. I do feel like he is showing signs of improvement. There is little bit of callus and dead tissue around the edges of the wound as well as what appears to be Max little bit of Max sinus tract that is off to the side laterally I would perform debridement to clear that away today. 3/17; left lateral foot. The wound looks about the same as I remember. Not much depth surface looks healthy. No evidence of infection 3/25; left lateral foot. Wound surface looks about the same. Separating epithelium from the circumference. There really is no evidence of infection here however not making progress by my view 3/29; left lateral foot. Surface of the wound again looks reasonably healthy still thick  skin and subcutaneous tissue around the wound margins. There is no evidence of infection. One of the concerns being brought up by the nurses has again the amount of drainage vis--vis continued use of Max total contact cast 4/5; left lateral foot at roughly the base of the fifth metatarsal. Nice healthy looking granulated tissue with rims of epithelialization. The  overall wound measurements are not any better but the tissue looks healthy. The only concern is the amount of drainage although he has no surrounding maceration with what we have been doing recently to absorb fluid and protect his skin. He also has lymphedema. 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 He states that the kerlix/coban under the TCC rolled down and could not get it back up. He has been using an offloading scooter and has somehow hurt his right foot using this device. This happened last week. He states that the side of his right foot developed Max blister and opened. The top of his foot also has Max few small open wounds he thinks is due to his socks rubbing in his shoes. He has not been using any dressings to the wound. He denies purulent drainage, fever/chills or erythema to the wounds. 4/22; patient presents for 1 week follow-up. He developed new wounds to the right foot that were evaluated at last clinic visit. He continues to have Max total contact cast to the left leg and he reports no issues. He has been using silver collagen to the right foot wounds with no issues. He denies purulent drainage, fever/chills or erythema to the right foot wounds. He has no complaints today 4/25; patient presents for 1 week follow-up. He has Max total contact cast of the left leg and reports no issues. He  has been using silver alginate to the right foot wound. He denies purulent drainage, fever/chills or erythema to the right foot wounds. 5/2 patient presents for 1 week follow-up. T contact cast on the left. The wound which is on the base of the plantar foot at the base of the fifth metatarsal otal actually looks quite good and dimensions continue to gradually contract. HOWEVER the area on the right lateral fifth metatarsal head is much larger than what I remember from 2 weeks ago. Once more is he has significant levels of hypergranulation. Noteworthy that he had this same hyper granulated response on his wound on the left foot at one point in time. So much so that he I thought there was an underlying fluid collection. Based on this I think this just needs debridement. 5/9; the wound on the left actually continues to be gradually smaller with Max healthy surface. Slight amount of drainage and maceration of the skin around but not too bad. However he has Max large wound over the right fifth metatarsal head very much in the same configuration as his left foot wound was initially. I used silver nitrate to address the hyper granulated tissue no mechanical debridement 5/16; area on the left foot did not look as healthy this week deeper thick surrounding macerated skin and subcutaneous tissue. The area on the right foot fifth met head was about the same The area on the right ankle that we identified last week is completely broken down into an open wound presumably Max stocking rubbing issue 5/23; patient has been using Max total contact cast to the left side. He has been using silver alginate underneath. He has also been using silver alginate to the right foot wounds. He has no complaints today. He denies any signs of infection. 5/31; the left-sided wound looks some better measure smaller  surface granulation looks better. We have been using silver alginate under the total contact cast The large area on his right  fifth met head and right dorsal foot look about the same still using silver alginate 6/6; neither side is good as I was hoping although the surface area dimensions are better. Max lot of maceration on his left and right foot around the wound edge. Area on the dorsal right foot looks better. He says he was traveling. I am not sure what does the amount of maceration around the plantar wounds may be drainage issues 6/13; in general the wound surfaces look quite good on both sides. Macerated skin and raised edges around the wound required debridement although in general especially on the left the surface area seems improved. The area on the right dorsal ankle is about the same I thought this would not be such Max problem to close 6/20; not much change in either wound although the one on the right looks Max little better. Both wounds have thick macerated edges to the skin requiring debridements. We have been using silver alginate. The area on the dorsal right ankle is still open I thought this would be closed. 6/28; patient comes in today with Max marked deterioration in the right foot wound fifth met head. Wide area of exposed bone this is Max drastic change from last time. The area on the left there we have been casting is stagnant. We have been using silver alginate in both wound areas. 7/5; bone culture I did for PCR last time was positive for Pseudomonas, group B strep, Enterococcus and Staph aureus. There was no suggestion of methicillin resistance or ampicillin resistant genes. This was resistant to tetracycline however He comes into the clinic today with the area over his right plantar fifth metatarsal head which had been doing so well 2 weeks ago completely necrotic feeling bone. I do not know that this is going to be salvageable. The left foot wound is certainly no smaller but it has Max better surface and is superficial. 7/8; patient called in this morning to say that his total contact cast was rubbing  against his foot. He states he is doing fine overall. He denies signs of infection. 7/12; continued deterioration in the wound over the right fifth metatarsal head crumbling bone. This is not going to be salvageable. The patient agrees and wants to be referred to Dr. Doran Durand which we will attempt to arrange as soon as possible. I am going to continue him on antibiotics as long as that takes so I will renew those today. The area on the left foot which is the base of the fifth metatarsal continues to look somewhat better. Healthy looking tissue no depth no debridement is necessary here. 7/20; the patient was kindly seen by Dr. Doran Durand of orthopedics on 10/19/2020. 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 Towards this end we have taken him out of the total contact cast on the left we will put him in Max wrap with Hydrofera Blue. As I understand things surgery is planned for 7/21 7/27; patient had his surgery last Thursday. 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 Apparently everything went well we did not still disturb that today The area on the left foot actually looks quite good. He has been much less mobile which probably explains this he did not seem to do well in  the total contact cast secondary to drainage and maceration I think. 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 Fortunately there is no evidence of active infection at this time. No fevers, chills, nausea, vomiting, or diarrhea. Overall I think that he is actually doing extremely well. Nonetheless I do believe that he is staying off of this more following the surgery in his right foot that is the reason the left is doing so great. 8/16; left plantar foot wound. This looks smaller than the last time I saw this he is using Hydrofera Blue. The  surgical wound on the right foot is being followed by Dr. Doran Durand we did not look at this today. He has surgical shoes on both feet 8/23; left plantar foot wound not as good this week. Surrounding macerated skin and subcutaneous tissue everything looks moist and wet. I do not think he is offloading this adequately. He is using Max surgical shoe Apparently the right foot surgical wound is not open although I did not check his foot 8/31; left plantar foot lateral aspect. Much improved this week. He has no maceration. Some improvement in the surface area of the wound but most impressively the depth is come in we are using silver alginate. The patient is Max Product/process development scientist. He is asked that we write him Max letter so he can go back to work. I have also tried to see if we can write something that will allow him to limit the amount of time that he is on his foot at work. 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 Hopefully the latter can be avoided 9/6; I believe the patient missed an appointment last week. He arrives in today with Max wound looking roughly the same certainly no better. Undermining laterally and also inferiorly. We used molecuLight today in training with the patient's permission.. We are using silver alginate 9/21 wound is measuring bigger this week although this may have to do with the aggressive circumferential debridement last week in response to the blush fluorescence on the MolecuLight. Culture I did last week showed significant MSSA and E. coli. I put him on Augmentin but he has not started it yet. We are also going to send this for compounded antibiotics at [facility]. There is no evidence of systemic infection 9/29; silver alginate. His Keystone arrived. He is completing Augmentin in 2 days. Offloading in Max cam boot. Moderate drainage per our intake staff 10/5; using silver alginate. He has been using his  [place]. He has completed his Augmentin. Per our intake nurse still Max lot of drainage, far too much to consider Max total contact cast. Wound measures about the same. He had the same undermining area that I defined last week from Max roughly 11-3. I remove this today 10/12; using silver alginate he is using the [place]. He comes in for Max nurse visit hence we are applying [employer] twice Max week. Measuring slightly better today and less notable drainage. Extensive debridement of the wound edge last time 10/18; using topical Keystone and silver alginate and Max soft cast. Wound measurements about the same. Drainage was through his soft cast. We are changing this twice Max week Tuesdays and Friday 10/25; comes in with moderate drainage. Still using Keystone silver alginate and Max soft cast. Wound dimensions completely the  same.He has Max lot of edema in the left leg he has lymphedema. Asking for [name] to consider wrapping him as he cannot get his stocking on over the soft cast 11/2; comes in with moderate to large drainage slightly smaller in terms of width we have been using [place]. His wound looks satisfactory but not much improvement 11/4; patient presents today for obligatory cast change. Has no issues or complaints today. He denies signs of infection. 11/9; patient traveled this weekend to [place], was on the cast quite Max bit. Staining of the cast with black material from his walking boot. Drainage was not quite as bad as we feared. Using silver alginate and Keystone 11/16; we do not have size for cast therefore we have been putting Max soft cast on him since the change on Friday. Still Max significant amount of drainage necessitating changing twice Max week. We have been using the Keystone at cast changes either hard or soft as well as silver alginate Comes in the clinic with things actually looking fairly good improvement in width. He says his offloading is about the same 02/24/2021 upon evaluation today patient  actually comes back in and is doing excellent in regard to his foot ulcer this is significantly smaller even compared to the last visit. The soft cast seems to have done extremely well for him which is great news. I do not see any signs of infection minimal debridement will be needed today. 11/30; left lateral foot much improved half Max centimeter improvement in surface area. No evidence of infection. He seems to be doing better with the soft cast in the TCC therefore we will continue with this. He comes back in later in the week for Max change with the nurses. This is due to drainage 12/6; no improvement in dimensions. Under illumination some debris on the surface we have been using silver alginate, soft cast. If there is anything optimistic here he seems to have have less drainage 12/13. Dimensions are improved both length and width and slightly in depth. Appears to be quite healthy today. Raised edges of this thick skin and callus around the edges however. He is in Max soft cast were bringing him back once for Max change on Friday. Drainage is better 12/20. Dimensions are improved. He still has raised edges of thick skin and callus around the edges. We are using Max soft cast 12/28; comes in today with thick callus around the wound. Using silver under alginate under Max soft cast. I do not think there is much improvement in any measurement 2023 04/06/2021; patient was put in Max total contact cast. Unfortunately not much change in surface area 1/10; not much different still thick callus and skin around the edge in spite of the total contact cast. This was just debrided last week we have been using the [facility] compounded antibiotic and silver alginate under Max total contact cast 1/18 the patient's wound on the left side is doing nicely. smaller HOWEVER he comes in today with Max wound on the right foot laterally. blister most likely serosangquenous drainage 1/24; the patient continues to do well in terms of the  plantar left foot which is continued to contract using silver alginate under the total contact cast HOWEVER the right lateral foot is bigger with denuded skin around the edges. I used pickups and Max #15 scalpel to remove this this looks like the remanence of Max large blister. Cannot rule out infection. Culture in this area I did last week showed Staphylococcus lugdunensis  few colonies. I am going to try to address this with his [employer] antibiotic that is done so well on the left having linezolid and this should cover the staph 2/1; the patient's wound on his left foot which was the original plantar foot wound thick skin and eschar around the edges even in the total contact cast but the wound surface does not look too bad The real problem is on how his right lateral foot at roughly the base of the fifth metatarsal. The wound is completely necrotic more worrisome than that there is swelling around the edges of this. We have been using silver alginate on both wounds and Keystone on the right foot. Unfortunately I think he is going to require systemic antibiotics while we await cultures. He did not get the x-ray done that we ordered last week [lost the prescription 2/7; disappointingly in the area on the left foot which we are treating with Max total contact cast is still not closed although it is much smaller. He continues to have Max lot of callus around the wound edge. -Right lateral foot culture I did last week was negative x-ray also negative for osteomyelitis. 2/15: TCC silver alginate on the left and silver alginate on the right lateral. No real improvement in either area 05/26/2021: T oday, the wounds are roughly the same size as at his previous visit, post-debridement. He continues to endorse fairly substantial drainage, particularly on the right. He has been in Max total contact cast on the left. There is still some callus surrounding this lesion. On the right, the periwound skin is quite macerated,  along with surrounding callus. The center of the right-sided wound also has some dark, densely adherent material, which is very difficult to remove. Electronic Signature(s) Signed: 05/26/2021 8:47:18 AM By: [name] MD, FACS Entered By: [name] on 05/26/2021 08:47:18 -------------------------------------------------------------------------------- Physical Exam Details Patient Name: Date of Service: [name], [name] 05/26/2021 7:30 Max M Medical Record Number: [id number] Patient Account Number: [id number] Date of Birth/Sex: Treating RN: [birth year] (36 y.o. [name] Primary Care Provider: [name] Other Clinician: Referring Provider: Treating Provider/Extender: [name] in Treatment: 67 Constitutional . . He is obese. Notes 05/26/2021: The left foot has macerated skin surrounding the wound. There is also Max central area of extremely densely adherent necrotic appearing slough. On the right, the center of the wound bleeds easily but there is thick callus surrounding the site. Both sites were debrided in clinic today. His toenails are quite overgrown bilaterally and I am concerned for potential injury to the adjacent digits. I did not pare these down today but placed Max referral to podiatry for diabetic nail care. Electronic Signature(s) Signed: 05/26/2021 8:51:52 AM By: [name] MD, FACS Previous Signature: 05/26/2021 8:49:33 AM Version By: [name] MD, FACS Entered By: [name] on 05/26/2021 08:51:52 -------------------------------------------------------------------------------- Physician Orders Details Patient Name: Date of Service: [name], [name] 05/26/2021 7:30 Max M Medical Record Number: [id number] Patient Account Number: [id number] Date of Birth/Sex: Treating RN: Jan 10, 1986 (36 y.o. 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 - Dr. [name] room 3 Return Max Bathing/ Shower/ Hygiene May shower with protection but do not get wound dressing(s) wet. - Use Cast Protector Bag Edema Control - Lymphedema / SCD / Other Bilateral Lower Extremities Elevate legs to the level of the heart or above for 30 minutes daily and/or when sitting, Max frequency of: - throughout the day Avoid standing for long periods of time. Exercise regularly Moisturize legs daily. - right leg every night before bed. Compression stocking or Garment 20-30 mm/Hg pressure to: - Apply to right leg in the morning and remove at night. Off-Loading Total Contact Cast to Left Lower Extremity Open toe surgical shoe to: - right foot Other: - minimal weight bearing left foot Additional Orders / Instructions Follow Nutritious Diet Wound Treatment Wound #3 - Foot Wound Laterality: Left, Lateral Cleanser: Soap and Water 1 x Per Week/7 Days Discharge Instructions: May shower and wash wound with dial antibacterial soap and water prior to dressing change. Topical: keystone 1 x Per Week/7 Days Prim Dressing: Hydrofera Blue Classic Foam, 2x2 in 1 x Per Week/7 Days ary Discharge Instructions: Moisten with saline prior to applying to wound bed Secondary Dressing: Woven Gauze Sponge, Non-Sterile 4x4 in 1 x Per Week/7 Days Discharge Instructions: Apply over primary dressing as directed. Secondary Dressing: Optifoam Non-Adhesive Dressing, 4x4 in 1 x Per Week/7 Days Discharge Instructions: Apply over primary dressing as directed. Compression  Wrap: Kerlix Roll 4.5x3.1 (in/yd) 1 x Per Week/7 Days Discharge Instructions: Apply Kerlix and Coban compression as directed. Compression Wrap: Coban Self-Adherent Wrap 4x5 (in/yd) 1 x Per Week/7 Days Discharge Instructions: Apply over Kerlix as directed. Wound #8 - Foot Wound Laterality: Right, Lateral Cleanser: Soap and Water Every Other Day/15 Days Discharge Instructions: May shower and wash wound with dial antibacterial soap and water prior to dressing change. Peri-Wound Care: Zinc Oxide Ointment 30g tube Every Other Day/15 Days Discharge Instructions: Apply Zinc Oxide to periwound with each dressing change Topical: keystone Every Other Day/15 Days Discharge Instructions: apply thin layer to wouind bed Prim Dressing: KerraCel Ag Gelling Fiber Dressing, 4x5 in (silver alginate) (DME) (Dispense As Written) Every Other Day/15 Days ary Discharge Instructions: Apply silver alginate to wound bed as instructed Secondary Dressing: Woven Gauze Sponge, Non-Sterile 4x4 in (DME) (Generic) Every Other Day/15 Days Discharge Instructions: Apply over primary dressing as directed. Secondary Dressing: ABD Pad, 5x9 (DME) (Generic) Every Other Day/15 Days Discharge Instructions: Apply over primary dressing as directed. Secondary Dressing: Optifoam Non-Adhesive Dressing, 4x4 in (DME) (Generic) Every Other Day/15 Days Discharge Instructions: Apply over primary dressing as directed. Secured With: Coban Self-Adherent Wrap 4x5 (in/yd) (DME) (Generic) Every Other Day/15 Days Discharge Instructions: Secure with Coban as directed. Secured With: [employer], 4.5x3.1 (in/yd) (DME) (Generic) Every Other Day/15 Days Discharge Instructions: Secure with Kerlix as directed. Secured With: 40M Medipore H Soft Cloth Surgical T ape, 4 x 10 (in/yd) (DME) (Generic) Every Other Day/15 Days Discharge Instructions: Secure with tape as directed. Consults Podiatry - [facility] - (ICD10  E11.621 - Type 2 diabetes mellitus with foot ulcer) Electronic Signature(s) Signed: 05/26/2021 9:54:39 AM By: [name] MD, FACS Entered By: [name] on 05/26/2021 08:50:47 Prescription 05/26/2021 -------------------------------------------------------------------------------- White, [name] Tarrance Januszewski MD Patient Name: Provider: [birth year] [id number] Date of Birth: NPI#Marice Potter Sex: DEA #: [phone] Phone #: License #: [place] Patient Address: [address] [place], [place] [address] [place]  [place], [place] [address] [phone] Allergies metformin Provider's Orders Podiatry - ICD10: E11.621 - [facility] Hand Signature: Date(s): Electronic Signature(s) Signed: 05/26/2021 8:59:13 AM By: [name] MD, FACS Entered By: [name] on 05/26/2021 08:59:13 -------------------------------------------------------------------------------- Problem List Details Patient Name: Date of Service: [name], [name] 05/26/2021 7:30 Max M Medical Record Number: [id number] Patient Account Number: [id number] Date of Birth/Sex: Treating RN: March 26, 1986 (36 y.o. [name] Primary Care Provider: [name] Other Clinician: Referring Provider: Treating Provider/Extender: [name] in Treatment: 55 Active Problems ICD-10 Encounter Code Description Active Date MDM Diagnosis E11.621 Type 2 diabetes mellitus with foot ulcer 10/24/2019 No Yes L97.528 Non-pressure chronic ulcer of other part of left foot with other specified 10/24/2019 No Yes severity L97.518 Non-pressure chronic ulcer of other part of right foot with other specified 10/24/2019 No Yes severity Inactive Problems ICD-10 Code Description Active Date Inactive Date L97.518 Non-pressure chronic ulcer of other part of right foot with other specified  severity 07/14/2020 07/14/2020 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, right ankle and foot 10/24/2019 10/24/2019 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 08/10/2020 08/10/2020 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, left ankle and foot 10/24/2019 10/24/2019 B95.62 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 10/24/2019 10/24/2019 classified elsewhere Resolved Problems Electronic Signature(s) Signed: 05/26/2021 8:41:53 AM By: [name] MD, FACS Entered By: [name] on 05/26/2021 08:41:53 -------------------------------------------------------------------------------- Progress Note Details Patient Name: Date of Service: [name], [name] 05/26/2021 7:30 Max M Medical Record Number: [id number] Patient Account Number: [id number] Date of Birth/Sex: Treating RN: [birth year] (36 y.o. 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 He comes in for review of Max wound over the plantar fifth metatarsal head extending into the lateral part of the foot. He was followed for this previously by his podiatrist Dr. Cornelius Moras. As the patient tells his story he went to see podiatry first for Max swelling he developed on the lateral part of his fifth metatarsal head in May. He states this was "open" by podiatry and the area closed. He was followed  up in June and it was again opened callus removed and it closed promptly. There were plans being made for surgery on the fifth metatarsal head in June however his blood sugar was apparently too high for anesthesia. Apparently the area was debrided and opened again in June and it is never closed since. Looking over the records from podiatry I am really not able to follow this. It was clear when he was first seen it was before 5/14 at that point he already had Max wound. By 5/17 the ulcer was resolved. I do not see anything about Max procedure. On 5/28 noted to have pre-ulcerative moderate keratosis. X-ray noted 1/5 contracted toe and tailor's bunion and metatarsal deformity. On Max visit date on 09/28/2018 the dorsal part of the left foot it healed and resolved. There was concern about swelling in his lower extremity he was sent  to the ER.. As far as I can tell he was seen in the ER on 7/12 with an ulcer on his left foot. Max DVT rule out of the left leg was negative. I do not think I have complete records from podiatry but I am not able to verify the procedures this patient states he had. He states after the last procedure the wound has never closed although I am not able to follow this in the records I have from podiatry. He has not had Max recent x-ray The patient has been using Neosporin on the wound. He is wearing Max Darco shoe. He is still very active up on his foot working and exercising. Past medical history; type 2 diabetes ketosis-prone, leg swelling with Max negative DVT study in July. Non-smoker ABI in our clinic was 0.85 on the left 10/16; substantial wound on the plantar left fifth met head extending laterally almost to the dorsal fifth MTP. We have been using silver alginate we gave him Max Darco forefoot off loader. An x-ray did not show evidence of osteomyelitis did note soft tissue emphysema which I think was due to gas tracking through an open wound. There is no doubt in my mind he requires an  MRI 10/23; MRI not booked until 3 November at the earliest this is largely due to his glucose sensor in the right arm. We have been using silver alginate. There has been an improvement 10/29; I am still not exactly sure when his MRI is booked for. He says it is the third but it is the 10th in epic. This definitely needs to be done. He is running Max low-grade fever today but no other symptoms. No real improvement in the 1 02/26/2019 patient presents today for Max follow-up visit here in our clinic he is last been seen in the clinic on October 29. Subsequently we were working on getting MRI to evaluate and see what exactly was going on and where we would need to go from the standpoint of whether or not he had osteomyelitis and again what treatments were going be required. Subsequently the patient ended up being admitted to the hospital on 02/07/2019 and was discharged on 02/14/2019. This is Max somewhat interesting admission with Max discharge diagnosis of pneumonia due to COVID-19 although he was positive for COVID-19 when tested at the urgent care but negative x2 when he was actually in the hospital. With that being said he did have acute respiratory failure with hypoxia and it was noted he also have Max left foot ulceration with osteomyelitis. With that being said he did require oxygen for his pneumonia and I level 4 L. He was placed on antivirals and steroids for the COVID-19. He was also transferred to the [facility] at one point. Nonetheless he did subsequently discharged home and since being home has done much better in that regard. The CT angiogram did not show any pulmonary embolism. With regard to the osteomyelitis the patient was placed on vancomycin and Zosyn while in the hospital but has been changed to Augmentin at discharge. It was also recommended that he follow- up with wound care and podiatry. Podiatry however wanted him to see [name] according to the patient prior to them doing anything  further. His hemoglobin A1c was 9.9 as noted in the hospital. Have an MRI of the left foot performed while in the hospital on 02/04/2019. 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 There is an overlying plantar open wound noted an abscess tracking back along the lateral aspect of the fifth metatarsal shaft. There is otherwise diffuse cellulitis and mild fasciitis without findings of polymyositis. The patient did have recently pneumonia secondary to COVID-19 I looked in the chart through epic and it does appear that the patient may need to have an additional x-ray just to ensure everything is cleared and that he has no airspace disease prior to putting him into the [name]. 03/05/2019; patient was readmitted to the clinic last week. He was hospitalized twice for Max viral upper respiratory tract infection from 11/1 through 11/4 and then 11/5 through 11/12 ultimately this turned out to be Covid pneumonitis. Although he was discharged on oxygen he is not using it. He says he feels fine. He has no exercise limitation no cough no sputum. His O2 sat in our clinic today was 100% on room air. 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 He received Vanco and Zosyn in the hospital and then was discharged on 2 weeks of Augmentin. I do not see any relevant cultures. He was supposed to follow-up with infectious disease but I do not see that he has an appointment. 12/8; patient saw Dr. Novella Olive of infectious disease last week. He felt that he had had adequate antibiotic therapy. He did not go to follow-up with Dr. Amalia Hailey of podiatry and I have again talked to him about the pros and cons of this. He does not want to consider Max ray amputation of this time. He is aware of the risks of recurrence, migration etc. He  started HBO today and tolerated this well. He can complete the Augmentin that I gave him last week. I have looked over the lab work that Dr. Chana Bode ordered his C-reactive protein was 3.3 and his sedimentation rate was 17. The C-reactive protein is never really been measurably that high in this patient 12/15; not much change in the wound today however he has undermining along the lateral part of the foot again more extensively than last week. He has some rims of epithelialization. We have been using silver alginate. He is undergoing hyperbarics but did not dive today 12/18; in for his obligatory first total contact cast change. Unfortunately there was pus coming from the undermining area around his fifth metatarsal head. This was cultured but will preclude reapplication of Max cast. He is seen in conjunction with HBO 12/24; patient had staph lugdunensis in the wound in the undermining area laterally last time. We put him on doxycycline which should have covered this. The wound looks better today. I am going to give him another week of doxycycline before reattempting the total contact cast 12/31; the patient is completing antibiotics. Hemorrhagic debris in the distal part of the wound with some undermining distally. He also had hyper granulation. Extensive debridement with Max #5 curette. The infected area that was on the lateral part of the fifth met head is closed over. I do not think he needs any more antibiotics. Patient was seen prior to HBO. Preparations for Max total contact cast were made in the cast will be placed post hyperbarics 04/11/19; once again the patient arrives today without complaint. He had been in Max cast all week noted that he had heavy drainage this week. This resulted in large raised areas of macerated tissue around the wound 1/14; wound bed  looks better slightly smaller. Hydrofera Blue has been changing himself. He had Max heavy drainage last week which caused Max lot of maceration around  the wound so I took him out of Max total contact cast he says the drainage is actually better this week He is seen today in conjunction with HBO 1/21; returns to clinic. He was up in [place] for Max day or 2 attending Max funeral. He comes back in with the wound larger and with Max large area of exposed bone. He had osteomyelitis and septic arthritis of the fifth left metatarsal head while he was in hospital. He received IV antibiotics in the hospital for Max prolonged period of time then 3 weeks of Augmentin. Subsequently I gave him 2 weeks of doxycycline for more superficial wound infection. When I saw this last week the wound was smaller the surface of the wound looks satisfactory. 1/28; patient missed hyperbarics today. Bone biopsy I did last time showed Enterococcus faecalis and Staphylococcus lugdunensis . He has Max wide area of exposed bone. We are going to use silver alginate as of today. I had another ethical discussion with the patient. 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 In this situation I think the likelihood of healing this is low. Therefore I have recommended Max ray amputation and with the patient's agreement I have referred him to Dr. Doran Durand. The other issue is that his compliance with hyperbarics has been minimal because of his work schedule and given his underlying decision I am going to stop this today READMISSION 10/24/2019 MRI 09/29/2019 left foot IMPRESSION: 1. Apparent skin ulceration inferior and lateral to the 5th metatarsal base with underlying heterogeneous T2 signal and enhancement in the subcutaneous fat. Small peripherally enhancing fluid collections along the plantar and lateral aspects of the 5th metatarsal base suspicious for abscesses. 2. Interval amputation through the mid 5th metatarsal with nonspecific low-level marrow edema and enhancement. Given the proximity to the adjacent soft tissue inflammatory changes, osteomyelitis  cannot be excluded. 3. The additional bones appear unremarkable. MRI 09/29/2019 right foot IMPRESSION: 1. Soft tissue ulceration lateral to the 5th MTP joint. There is low-level T2 hyperintensity within the 4th and 5th metatarsal heads and adjacent proximal phalanges without abnormal T1 signal or cortical destruction. These findings are nonspecific and could be seen with early marrow edema, hyperemia or early osteomyelitis. No evidence of septic joint. 2. Mild tenosynovitis and synovial enhancement associated with the extensor digitorum tendons at the level of the midfoot. 3. Diffuse low-level muscular T2 hyperintensity and enhancement, most consistent with diabetic myopathy. LEFT FOOT BONE Methicillin resistant staphylococcus aureus Staphylococcus lugdunensis MIC MIC CIPROFLOXACIN >=8 RESISTANT Resistant <=0.5 SENSI... Sensitive CLINDAMYCIN <=0.25 SENS... Sensitive >=8 RESISTANT Resistant ERYTHROMYCIN >=8 RESISTANT Resistant >=8 RESISTANT Resistant GENTAMICIN <=0.5 SENSI... Sensitive <=0.5 SENSI... Sensitive Inducible Clindamycin NEGATIVE Sensitive NEGATIVE Sensitive OXACILLIN >=4 RESISTANT Resistant 2 SENSITIVE Sensitive RIFAMPIN <=0.5 SENSI... Sensitive <=0.5 SENSI... Sensitive TETRACYCLINE <=1 SENSITIVE Sensitive <=1 SENSITIVE Sensitive TRIMETH/SULFA <=10 SENSIT Sensitive <=10 SENSIT Sensitive ... Marland Kitchen.. VANCOMYCIN 1 SENSITIVE Sensitive <=0.5 SENSI... Sensitive Right foot bone . Component 3 wk ago Specimen Description BONE Special Requests RIGHT 4 METATARSAL SAMPLE B Gram Stain NO WBC SEEN NO ORGANISMS SEEN Culture RARE METHICILLIN RESISTANT STAPHYLOCOCCUS AUREUS NO ANAEROBES ISOLATED Performed at [facility], [place] [address], [place], [place] [address] Report Status 10/08/2019 FINAL Organism ID, Bacteria METHICILLIN RESISTANT STAPHYLOCOCCUS AUREUS Resulting Agency [facility] Susceptibility Methicillin resistant staphylococcus aureus MIC CIPROFLOXACIN >=8 RESISTANT  Resistant CLINDAMYCIN <=  0.25 SENS... Sensitive ERYTHROMYCIN >=8 RESISTANT Resistant GENTAMICIN <=0.5 SENSI... Sensitive Inducible Clindamycin NEGATIVE Sensitive OXACILLIN >=4 RESISTANT Resistant RIFAMPIN <=0.5 SENSI... Sensitive TETRACYCLINE <=1 SENSITIVE Sensitive TRIMETH/SULFA <=10 SENSIT Sensitive ... VANCOMYCIN 1 SENSITIVE Sensitive This is Max patient we had in clinic earlier this year with Max wound over his left fifth metatarsal head. 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 Doran Durand on 05/16/2019. He seems to have developed problems with wounds on his bilateral feet in June 2021 although he may have had problems earlier than this. He was in an urgent care with Max right foot ulcer on 09/26/2019 and given Max course of doxycycline. This was apparently after having trouble getting into see orthopedics. He was seen by podiatry on 09/28/2019 noted to have bilateral lower extremity ulcers including the left lateral fifth metatarsal base and the right subfifth met head. It was noted that had purulent drainage at that time. He required hospitalization from 6/20 through 7/2. This was because of worsening right foot wounds. He underwent bilateral operative incision and drainage and bone biopsies bilaterally. Culture results are listed above. He has been referred back to clinic by Dr. Jacqualyn Posey of podiatry. He is also followed by Dr. Megan Salon who saw him yesterday. He was discharged from hospital on Zyvox Flagyl and Levaquin and yesterday changed to doxycycline Flagyl and Levaquin. His inflammatory markers on 6/26 showed Max sedimentation rate of 129 and Max C-reactive protein of 5. This is improved to 14 and 1.3 respectively. This  would indicate improvement. ABIs in our clinic today were 1.23 on the right and 1.20 on the left 11/01/2019 on evaluation today patient appears to be doing fairly well in regard to the wounds on his feet at this point. Fortunately there is no signs of active infection at this time. No fevers, chills, nausea, vomiting, or diarrhea. He currently is seeing infectious disease and still under their care at this point. Subsequently he also has both wounds which she has not been using collagen on as he did not receive that in his packaging he did not call us and let us know that. Apparently that just was missed on the order. Nonetheless we will get that straightened out today. 8/9-Patient returns for bilateral foot wounds, using Prisma with hydrogel moistened dressings, and the wounds appear stable. Patient using surgical shoes, avoiding much pressure or weightbearing as much as possible 8/16; patient has bilateral foot wounds. 1 on the right lateral foot proximally the other is on the left mid lateral foot. Both required debridement of callus and thick skin around the wounds. We have been using silver collagen 8/27; patient has bilateral lateral foot wounds. The area on the left substantially surrounded by callus and dry skin. This was removed from the wound edge. The underlying wound is small. The area on the right measured somewhat smaller today. 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 Unfortunately I did not update his antibiotics during today's visit. 9/10 I reviewed Dr. Hale Bogus last notes he felt he had completed antibiotics his inflammatory markers were reasonably well controlled. He has Max small wound on the lateral left foot and Max tiny area on the right which is just above closed. He is  using Hydrofera Blue with border foam he has bilateral surgical shoes 9/24; 2 week f/u. doing well. right foot is closed. left foot still  undermined. 10/14; right foot remains closed at the fifth met head. The area over the base of the left fifth metatarsal has Max small open area but considerable undermining towards the plantar foot. Thick callus skin around this suggests an adequate pressure relief. We have talked about this. He says he is going to go back into his cam boot. I suggested Max total contact cast he did not seem enamored with this suggestion 10/26; left foot base of the fifth metatarsal. Same condition as last time. He has skin over the area with an open wound however the skin is not adherent. He went to see Dr. Earleen Newport who did an x-ray and culture of his foot I have not reviewed the x-ray but the patient was not told anything. He is on doxycycline 11/11; since the patient was last here he was in the emergency room on 10/30 he was concerned about swelling in the left foot. They did not do any cultures or x-rays. They changed his antibiotics to cephalexin. Previous culture showed group B strep. The cephalexin is appropriate as doxycycline has less than predictable coverage. Arrives in clinic today with swelling over this area under the wound. He also has Max new wound on the right fifth metatarsal head 11/18; the patient has Max difficult wound on the lateral aspect of the left fifth metatarsal head. The wound was almost ballotable last week I opened it slightly expecting to see purulence however there was just bleeding. I cultured this this was negative. X-ray unchanged. We are trying to get an MRI but I am not sure were going to be able to get this through his insurance. He also has an area on the right lateral fifth metatarsal head this looks healthier 12/3; the patient finally got our MRI. Surprisingly this did not show osteomyelitis. I did show the soft tissue ulceration at the lateral plantar aspect of the fifth metatarsal base with Max tiny residual 6 mm abscess overlying the superficial fascia I have tried to culture this area  I have not been able to get this to grow anything. Nevertheless the protruding tissue looks aggravated. I suspect we should try to treat the underlying "abscess with broad-spectrum antibiotics. I am going to start him on Levaquin and Flagyl. He has much less edema in his legs and I am going to continue to wrap his legs and see him weekly 12/10. I started Levaquin and Flagyl on him last week. He just picked up the Flagyl apparently there was some delay. The worry is the wound on the left fifth metatarsal base which is substantial and worsening. His foot looks like he inverts at the ankle making this Max weightbearing surface. Certainly no improvement in fact I think the measurements of this are somewhat worse. We have been using 12/17; he apparently just got the Levaquin yesterday this is 2 weeks after the fact. He has completed the Flagyl. The area over the left fifth metatarsal base still has protruding granulation tissue although it does not look quite as bad as it did some weeks ago. He has severe bilateral lymphedema although we have not been treating him for wounds on his legs this is definitely going to require compression. There was so much edema in the left I did not wish to put him in Max total contact cast today. I am going to increase his  compression from 3-4 layer. The area on the right lateral fifth met head actually look quite good and superficial. 12/23; patient arrived with callus on the right fifth met head and the substantial hyper granulated callused wound on the base of his fifth metatarsal. He says he is completing his Levaquin in 2 days but I do not think that adds up with what I gave him but I will have to double check this. We are using Hydrofera Blue on both areas. My plan is to put the left leg in Max cast the week after New Year's 04/06/2020; patient's wounds about the same. Right lateral fifth metatarsal head and left lateral foot over the base of the fifth metatarsal. There is  undermining on the left lateral foot which I removed before application of total contact cast continuing with Hydrofera Blue new. Patient tells me he was seen by endocrinology today lab work was done [Dr. Kerr]. Also wondering whether he was referred to cardiology. I went over some lab work from previously does not have chronic renal failure certainly not nephrotic range proteinuria he does have very poorly controlled diabetes but this is not his most updated lab work. Hemoglobin A1c has been over 11 1/10; the patient had Max considerable amount of leakage towards mid part of his left foot with macerated skin however the wound surface looks better the area on the right lateral fifth met head is better as well. I am going to change the dressing on the left foot under the total contact cast to silver alginate, continue with Hydrofera Blue on the right. 1/20; patient was in the total contact cast for 10 days. Considerable amount of drainage although the skin around the wound does not look too bad on the left foot. The area on the right fifth metatarsal head is closed. Our nursing staff reports large amount of drainage out of the left lateral foot wound 1/25; continues with copious amounts of drainage described by our intake staff. PCR culture I did last week showed E. coli and Enterococcus faecalis and low quantities. Multiple resistance genes documented including extended spectrum beta lactamase, MRSA, MRSE, quinolone, tetracycline. The wound is not quite as good this week as it was 5 days ago but about the same size 2/3; continues with copious amounts of malodorous drainage per our intake nurse. The PCR culture I did 2 weeks ago showed E. coli and low quantities of Enterococcus. There were multiple resistance genes detected. I put Neosporin on him last week although this does not seem to have helped. The wound is slightly deeper today. Offloading continues to be an issue here although with the amount of  drainage she has Max total contact cast is just not going to work 2/10; moderate amount of drainage. Patient reports he cannot get his stocking on over the dressing. I told him we have to do that the nurse gave him suggestions on how to make this work. The wound is on the bottom and lateral part of his left foot. Is cultured predominantly grew low amounts of Enterococcus, E. coli and anaerobes. There were multiple resistance genes detected including extended spectrum beta lactamase, quinolone, tetracycline. I could not think of an easy oral combination to address this so for now I am going to do topical antibiotics provided by [facility] I think the main agents here are vancomycin and an aminoglycoside. We have to be able to give him access to the wounds to get the topical antibiotic on 2/17; moderate amount of drainage  this is unchanged. He has his Keystone topical antibiotic against the deep tissue culture organisms. He has been using this and changing the dressing daily. Silver alginate on the wound surface. 2/24; using Keystone antibiotic with silver alginate on the top. He had too much drainage for Max total contact cast at one point although I think that is improving and I think in the next week or 2 it might be possible to replace Max total contact cast I did not do this today. In general the wound surface looks healthy however he continues to have thick rims of skin and subcutaneous tissue around the wide area of the circumference which I debrided 06/04/2020 upon evaluation today patient appears to be doing well in regard to his wound. I do feel like he is showing signs of improvement. There is little bit of callus and dead tissue around the edges of the wound as well as what appears to be Max little bit of Max sinus tract that is off to the side laterally I would perform debridement to clear that away today. 3/17; left lateral foot. The wound looks about the same as I remember. Not much depth surface looks  healthy. No evidence of infection 3/25; left lateral foot. Wound surface looks about the same. Separating epithelium from the circumference. There really is no evidence of infection here however not making progress by my view 3/29; left lateral foot. Surface of the wound again looks reasonably healthy still thick skin and subcutaneous tissue around the wound margins. There is no evidence of infection. One of the concerns being brought up by the nurses has again the amount of drainage vis--vis continued use of Max total contact cast 4/5; left lateral foot at roughly the base of the fifth metatarsal. Nice healthy looking granulated tissue with rims of epithelialization. The overall wound measurements are not any better but the tissue looks healthy. The only concern is the amount of drainage although he has no surrounding maceration with what we have been doing recently to absorb fluid and protect his skin. He also has lymphedema. 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 He states that the kerlix/coban under the TCC rolled down and could not get it back up. He has been using an offloading scooter and has somehow hurt his right foot using this device. This happened last week. He states that the side of his right foot developed Max blister and opened. The top of his foot also has Max few small open wounds he thinks is due to his socks rubbing in his shoes. He has not been using any dressings to the wound. He denies purulent drainage, fever/chills or erythema to the wounds. 4/22; patient presents for 1 week follow-up. He developed new wounds to the right foot that were evaluated at last clinic visit. He continues to have Max total contact cast to the left leg and he  reports no issues. He has been using silver collagen to the right foot wounds with no issues. He denies purulent drainage, fever/chills or erythema to the right foot wounds. He has no complaints today 4/25; patient presents for 1 week follow-up. He has Max total contact cast of the left leg and reports no issues. He has been using  silver alginate to the right foot wound. He denies purulent drainage, fever/chills or erythema to the right foot wounds. 5/2 patient presents for 1 week follow-up. T contact cast on the left. The wound which is on the base of the plantar foot at the base of the fifth metatarsal otal actually looks quite good and dimensions continue to gradually contract. HOWEVER the area on the right lateral fifth metatarsal head is much larger than what I remember from 2 weeks ago. Once more is he has significant levels of hypergranulation. Noteworthy that he had this same hyper granulated response on his wound on the left foot at one point in time. So much so that he I thought there was an underlying fluid collection. Based on this I think this just needs debridement. 5/9; the wound on the left actually continues to be gradually smaller with Max healthy surface. Slight amount of drainage and maceration of the skin around but not too bad. However he has Max large wound over the right fifth metatarsal head very much in the same configuration as his left foot wound was initially. I used silver nitrate to address the hyper granulated tissue no mechanical debridement 5/16; area on the left foot did not look as healthy this week deeper thick surrounding macerated skin and subcutaneous tissue. oo The area on the right foot fifth met head was about the same oo The area on the right ankle that we identified last week is completely broken down into an open wound presumably Max stocking rubbing issue 5/23; patient has been using Max total contact cast to the left side. He has been using silver alginate  underneath. He has also been using silver alginate to the right foot wounds. He has no complaints today. He denies any signs of infection. 5/31; the left-sided wound looks some better measure smaller surface granulation looks better. We have been using silver alginate under the total contact cast oo The large area on his right fifth met head and right dorsal foot look about the same still using silver alginate 6/6; neither side is good as I was hoping although the surface area dimensions are better. Max lot of maceration on his left and right foot around the wound edge. Area on the dorsal right foot looks better. He says he was traveling. I am not sure what does the amount of maceration around the plantar wounds may be drainage issues 6/13; in general the wound surfaces look quite good on both sides. Macerated skin and raised edges around the wound required debridement although in general especially on the left the surface area seems improved. oo The area on the right dorsal ankle is about the same I thought this would not be such Max problem to close 6/20; not much change in either wound although the one on the right looks Max little better. Both wounds have thick macerated edges to the skin requiring debridements. We have been using silver alginate. The area on the dorsal right ankle is still open I thought this would be closed. 6/28; patient comes in today with Max marked deterioration in the right foot wound fifth met head. Wide area of exposed bone this is Max drastic change from last time. The area on the left there we have been casting is stagnant. We have been using silver alginate in both wound areas. 7/5; bone culture I did for PCR last time was positive for Pseudomonas, group B strep, Enterococcus and Staph aureus. There was no suggestion of methicillin resistance or  ampicillin resistant genes. This was resistant to tetracycline however He comes into the clinic today with the area over his right  plantar fifth metatarsal head which had been doing so well 2 weeks ago completely necrotic feeling bone. I do not know that this is going to be salvageable. The left foot wound is certainly no smaller but it has Max better surface and is superficial. 7/8; patient called in this morning to say that his total contact cast was rubbing against his foot. He states he is doing fine overall. He denies signs of infection. 7/12; continued deterioration in the wound over the right fifth metatarsal head crumbling bone. This is not going to be salvageable. The patient agrees and wants to be referred to Dr. Doran Durand which we will attempt to arrange as soon as possible. I am going to continue him on antibiotics as long as that takes so I will renew those today. The area on the left foot which is the base of the fifth metatarsal continues to look somewhat better. Healthy looking tissue no depth no debridement is necessary here. 7/20; the patient was kindly seen by Dr. Doran Durand of orthopedics on 10/19/2020. 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 Towards this end we have taken him out of the total contact cast on the left we will put him in Max wrap with Hydrofera Blue. As I understand things surgery is planned for 7/21 7/27; patient had his surgery last Thursday. 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 Apparently everything went well we did not still disturb that today The area on the left foot actually looks quite good. He has been much less mobile which probably explains this he did not seem to do well in the total contact cast secondary to drainage and maceration I think. 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 Fortunately there is no evidence of active infection at this time. No fevers, chills, nausea, vomiting,  or diarrhea. Overall I think that he is actually doing extremely well. Nonetheless I do believe that he is staying off of this more following the surgery in his right foot that is the reason the left is doing so great. 8/16; left plantar foot wound. This looks smaller than the last time I saw this he is using Hydrofera Blue. The surgical wound on the right foot is being followed by Dr. Doran Durand we did not look at this today. He has surgical shoes on both feet 8/23; left plantar foot wound not as good this week. Surrounding macerated skin and subcutaneous tissue everything looks moist and wet. I do not think he is offloading this adequately. He is using Max surgical shoe Apparently the right foot surgical wound is not open although I did not check his foot 8/31; left plantar foot lateral aspect. Much improved this week. He has no maceration. Some improvement in the surface area of the wound but most impressively the depth is come in we are using silver alginate. The patient is Max Product/process development scientist. He is asked that we write him Max letter so he can go back to work. I have also tried to see if we can write something that will allow him to limit the amount of time that he is on his foot at work. 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 Hopefully the latter can be avoided 9/6; I believe the patient missed an appointment last week. He arrives in today with Max wound looking roughly the same certainly no better. Undermining laterally and also inferiorly. We used molecuLight today in training with the patient's permission.. We are using silver alginate 9/21 wound is measuring bigger this week although this may have to do with the aggressive circumferential debridement last week in response to the blush fluorescence on the MolecuLight. Culture I did last week showed significant MSSA and E. coli. I put him on Augmentin but he has not  started it yet. We are also going to send this for compounded antibiotics at [facility]. There is no evidence of systemic infection 9/29; silver alginate. His Keystone arrived. He is completing Augmentin in 2 days. Offloading in Max cam boot. Moderate drainage per our intake staff 10/5; using silver alginate. He has been using his [place]. He has completed his Augmentin. Per our intake nurse still Max lot of drainage, far too much to consider Max total contact cast. Wound measures about the same. He had the same undermining area that I defined last week from Max roughly 11-3. I remove this today 10/12; using silver alginate he is using the [place]. He comes in for Max nurse visit hence we are applying [employer] twice Max week. Measuring slightly better today and less notable drainage. Extensive debridement of the wound edge last time 10/18; using topical Keystone and silver alginate and Max soft cast. Wound measurements about the same. Drainage was through his soft cast. We are changing this twice Max week Tuesdays and Friday 10/25; comes in with moderate drainage. Still using Keystone silver alginate and Max soft cast. Wound dimensions completely the same.He has Max lot of edema in the left leg he has lymphedema. Asking for [name] to consider wrapping him as he cannot get his stocking on over the soft cast 11/2; comes in with moderate to large drainage slightly smaller in terms of width we have been using [place]. His wound looks satisfactory but not much improvement 11/4; patient presents today for obligatory cast change. Has no issues or complaints today. He denies signs of infection. 11/9; patient traveled this weekend to [place], was on the cast quite Max bit. Staining of the cast with black material from his walking boot. Drainage was not quite as bad as we feared. Using silver alginate and Keystone 11/16; we do not have size for cast therefore we have been putting Max soft cast on him since the change on Friday. Still Max  significant amount of drainage necessitating changing twice Max week. We have been using the Keystone at cast changes either hard or soft as well as silver alginate Comes in the clinic with things actually looking fairly good improvement in width. He says his offloading is about the same 02/24/2021 upon evaluation today patient actually comes back in and is doing excellent in regard to his foot ulcer this is significantly smaller even compared to the last visit. The soft cast seems to have done extremely well for him which is great news. I do not see any signs of infection minimal debridement will be needed today. 11/30; left lateral foot much improved half Max centimeter improvement in surface area. No evidence of infection. He seems to be doing better with the soft cast in the TCC therefore we will continue with this.  He comes back in later in the week for Max change with the nurses. This is due to drainage 12/6; no improvement in dimensions. Under illumination some debris on the surface we have been using silver alginate, soft cast. If there is anything optimistic here he seems to have have less drainage 12/13. Dimensions are improved both length and width and slightly in depth. Appears to be quite healthy today. Raised edges of this thick skin and callus around the edges however. He is in Max soft cast were bringing him back once for Max change on Friday. Drainage is better 12/20. Dimensions are improved. He still has raised edges of thick skin and callus around the edges. We are using Max soft cast 12/28; comes in today with thick callus around the wound. Using silver under alginate under Max soft cast. I do not think there is much improvement in any measurement 2023 04/06/2021; patient was put in Max total contact cast. Unfortunately not much change in surface area 1/10; not much different still thick callus and skin around the edge in spite of the total contact cast. This was just debrided last week we have  been using the [facility] compounded antibiotic and silver alginate under Max total contact cast 1/18 the patient's wound on the left side is doing nicely. smaller HOWEVER he comes in today with Max wound on the right foot laterally. blister most likely serosangquenous drainage 1/24; the patient continues to do well in terms of the plantar left foot which is continued to contract using silver alginate under the total contact cast HOWEVER the right lateral foot is bigger with denuded skin around the edges. I used pickups and Max #15 scalpel to remove this this looks like the remanence of Max large blister. Cannot rule out infection. Culture in this area I did last week showed Staphylococcus lugdunensis few colonies. I am going to try to address this with his [employer] antibiotic that is done so well on the left having linezolid and this should cover the staph 2/1; the patient's wound on his left foot which was the original plantar foot wound thick skin and eschar around the edges even in the total contact cast but the wound surface does not look too bad The real problem is on how his right lateral foot at roughly the base of the fifth metatarsal. The wound is completely necrotic more worrisome than that there is swelling around the edges of this. We have been using silver alginate on both wounds and Keystone on the right foot. Unfortunately I think he is going to require systemic antibiotics while we await cultures. He did not get the x-ray done that we ordered last week [lost the prescription 2/7; disappointingly in the area on the left foot which we are treating with Max total contact cast is still not closed although it is much smaller. He continues to have Max lot of callus around the wound edge. -Right lateral foot culture I did last week was negative x-ray also negative for osteomyelitis. 2/15: TCC silver alginate on the left and silver alginate on the right lateral. No real improvement in either  area 05/26/2021: T oday, the wounds are roughly the same size as at his previous visit, post-debridement. He continues to endorse fairly substantial drainage, particularly on the right. He has been in Max total contact cast on the left. There is still some callus surrounding this lesion. On the right, the periwound skin is quite macerated, along with surrounding callus. The center of the  right-sided wound also has some dark, densely adherent material, which is very difficult to remove. Patient History Information obtained from Patient. Family History No family history of Cancer, Diabetes, Hereditary Spherocytosis, Hypertension, Kidney Disease, Lung Disease, Seizures, Stroke, Thyroid Problems, Tuberculosis. Social History Never smoker, Marital Status - Single, Alcohol Use - Rarely, Drug Use - No History, Caffeine Use - Never. Medical History Eyes Denies history of Cataracts, Glaucoma, Optic Neuritis Ear/Nose/Mouth/Throat Denies history of Chronic sinus problems/congestion, Middle ear problems Hematologic/Lymphatic Denies history of Anemia, Hemophilia, Human Immunodeficiency Virus, Lymphedema, Sickle Cell Disease Respiratory Denies history of Aspiration, Asthma, Chronic Obstructive Pulmonary Disease (COPD), Pneumothorax, Sleep Apnea, Tuberculosis Cardiovascular Denies history of Angina, Arrhythmia, Congestive Heart Failure, Coronary Artery Disease, Deep Vein Thrombosis, Hypertension, Hypotension, Myocardial Infarction, Peripheral Arterial Disease, Peripheral Venous Disease, Phlebitis, Vasculitis Gastrointestinal Denies history of Cirrhosis , Colitis, Crohnoos, Hepatitis Max, Hepatitis B, Hepatitis C Endocrine Patient has history of Type II Diabetes Denies history of Type I Diabetes Immunological Denies history of Lupus Erythematosus, Raynaudoos, Scleroderma Integumentary (Skin) Denies history of History of Burn Musculoskeletal Denies history of Gout, Rheumatoid Arthritis, Osteoarthritis,  Osteomyelitis Neurologic Denies history of Dementia, Neuropathy, Quadriplegia, Paraplegia, Seizure Disorder Oncologic Denies history of Received Chemotherapy, Received Radiation Psychiatric Denies history of Anorexia/bulimia, Confinement Anxiety Hospitalization/Surgery History - 11/1-11/06/2018- sepsis foot infection. - 11/4-11/5 02 sats low respiratory distress. Objective Constitutional He is obese. Vitals Time Taken: 7:47 AM, Height: 77 in, Source: Stated, Weight: 280 lbs, Source: Stated, BMI: 33.2, Temperature: 98.3 F, Pulse: 94 bpm, Respiratory Rate: 18 breaths/min, Blood Pressure: 143/88 mmHg, Capillary Blood Glucose: 132 mg/dl. General Notes: glucose per pt report this am General Notes: 05/26/2021: The left foot has macerated skin surrounding the wound. There is also Max central area of extremely densely adherent necrotic appearing slough. On the right, the center of the wound bleeds easily but there is thick callus surrounding the site. Both sites were debrided in clinic today. His toenails are quite overgrown bilaterally and I am concerned for potential injury to the adjacent digits. I did not pare these down today but placed Max referral to podiatry for diabetic nail care. Integumentary (Hair, Skin) Wound #3 status is Open. Original cause of wound was Trauma. The date acquired was: 10/02/2019. The wound has been in treatment 82 weeks. The wound is located on the Left,Lateral Foot. The wound measures 0.9cm length x 1.2cm width x 0.1cm depth; 0.848cm^2 area and 0.085cm^3 volume. There is Fat Layer (Subcutaneous Tissue) exposed. There is no tunneling or undermining noted. There is Max medium amount of serosanguineous drainage noted. The wound margin is thickened. There is large (67-100%) red granulation within the wound bed. There is no necrotic tissue within the wound bed. Wound #8 status is Open. Original cause of wound was Blister. The date acquired was: 04/18/2021. The wound has been in  treatment 5 weeks. The wound is located on the Right,Lateral Foot. The wound measures 3.8cm length x 3cm width x 0.1cm depth; 8.954cm^2 area and 0.895cm^3 volume. There is Fat Layer (Subcutaneous Tissue) exposed. There is no tunneling or undermining noted. There is Max medium amount of serosanguineous drainage noted. The wound margin is well defined and not attached to the wound base. There is medium (34-66%) red, friable granulation within the wound bed. There is Max medium (34-66%) amount of necrotic tissue within the wound bed including Eschar and Adherent Slough. 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 There was Max Excisional Skin/Subcutaneous Tissue Debridement with Max total area of 1.08 sq cm performed by [name], MD. With the following instrument(s): Curette to remove Viable and Non-Viable tissue/material. Material removed includes Eschar, Callus, Subcutaneous Tissue, Slough, and Skin: Epidermis. No specimens were taken. Max time out was conducted at 08:30, prior to the start of the procedure. Max Minimum amount of bleeding was controlled with Silver Nitrate. The procedure was tolerated well with Max pain level of 0 throughout and Max pain level of 0 following the procedure. Post Debridement Measurements: 0.9cm length x 1.2cm width x 0.1cm depth; 0.085cm^3 volume. Character of Wound/Ulcer Post Debridement is improved. Severity of Tissue Post Debridement is: Fat layer exposed. Post procedure Diagnosis Wound #3: Same as Pre-Procedure Pre-procedure diagnosis of Wound #3 is Max Diabetic Wound/Ulcer of the  Lower Extremity located on the Left,Lateral Foot . There was Max T Programmer, multimedia otal Procedure by [name], MD. Post procedure Diagnosis Wound #3: Same as Pre-Procedure Wound #8 Pre-procedure diagnosis of Wound #8 is Max Diabetic Wound/Ulcer of the Lower Extremity located on the Right,Lateral Foot .Severity of Tissue Pre Debridement is: Fat layer exposed. There was Max Excisional Skin/Subcutaneous Tissue Debridement with Max total area of 11.4 sq cm performed by [name], MD. With the following instrument(s): Curette to remove Viable and Non-Viable tissue/material. Material removed includes Eschar, Callus, Subcutaneous Tissue, Slough, and Skin: Epidermis. No specimens were taken. Max time out was conducted at 08:30, prior to the start of the procedure. Max Minimum amount of bleeding was controlled with Silver Nitrate. The procedure was tolerated well with Max pain level of 0 throughout and Max pain level of 0 following the procedure. Post Debridement Measurements: 3.8cm length x 3cm width x 0.1cm depth; 0.895cm^3 volume. Character of Wound/Ulcer Post Debridement is improved. Severity of Tissue Post Debridement is: Fat layer exposed. Post procedure Diagnosis Wound #8: Same as Pre-Procedure Plan Follow-up Appointments: Return Appointment in 1 week. - Dr. [name] room 3 Bathing/ Shower/ Hygiene: May shower with protection but do not get wound dressing(s) wet. - Use Cast Protector Bag Edema Control - Lymphedema / SCD / Other: Elevate legs to the level of the heart or above for 30 minutes daily and/or when sitting, Max frequency of: - throughout the day Avoid standing for long periods of time. Exercise regularly Moisturize legs daily. - right leg every night before bed. Compression stocking or Garment 20-30 mm/Hg pressure to: - Apply to right leg in the morning and remove at night. Off-Loading: T Contact Cast to Left Lower Extremity otal Open toe surgical shoe to: - right foot Other: - minimal  weight bearing left foot Additional Orders / Instructions: Follow Nutritious Diet Consults ordered were: Podiatry - [facility] for diabetic nail care WOUND #3: - Foot Wound Laterality: Left, Lateral Cleanser: Soap and Water 1 x Per Week/7 Days Discharge Instructions: May shower and wash wound with dial antibacterial soap and water prior to dressing change. Topical: keystone 1 x Per Week/7 Days Prim Dressing: Hydrofera Blue Classic Foam, 2x2 in 1 x Per Week/7 Days ary Discharge Instructions: Moisten with saline prior to applying to wound bed Secondary Dressing: Woven Gauze Sponge, Non-Sterile 4x4 in 1 x Per Week/7 Days Discharge Instructions: Apply over primary dressing  as directed. Secondary Dressing: Optifoam Non-Adhesive Dressing, 4x4 in 1 x Per Week/7 Days Discharge Instructions: Apply over primary dressing as directed. Com pression Wrap: Kerlix Roll 4.5x3.1 (in/yd) 1 x Per Week/7 Days Discharge Instructions: Apply Kerlix and Coban compression as directed. Com pression Wrap: Coban Self-Adherent Wrap 4x5 (in/yd) 1 x Per Week/7 Days Discharge Instructions: Apply over Kerlix as directed. WOUND #8: - Foot Wound Laterality: Right, Lateral Cleanser: Soap and Water Every Other Day/15 Days Discharge Instructions: May shower and wash wound with dial antibacterial soap and water prior to dressing change. Peri-Wound Care: Zinc Oxide Ointment 30g tube Every Other Day/15 Days Discharge Instructions: Apply Zinc Oxide to periwound with each dressing change Topical: keystone Every Other Day/15 Days Discharge Instructions: apply thin layer to wouind bed Prim Dressing: KerraCel Ag Gelling Fiber Dressing, 4x5 in (silver alginate) (DME) (Dispense As Written) Every Other Day/15 Days ary Discharge Instructions: Apply silver alginate to wound bed as instructed Secondary Dressing: Woven Gauze Sponge, Non-Sterile 4x4 in (DME) (Generic) Every Other Day/15 Days Discharge Instructions: Apply over  primary dressing as directed. Secondary Dressing: ABD Pad, 5x9 (DME) (Generic) Every Other Day/15 Days Discharge Instructions: Apply over primary dressing as directed. Secondary Dressing: Optifoam Non-Adhesive Dressing, 4x4 in (DME) (Generic) Every Other Day/15 Days Discharge Instructions: Apply over primary dressing as directed. Secured With: Coban Self-Adherent Wrap 4x5 (in/yd) (DME) (Generic) Every Other Day/15 Days Discharge Instructions: Secure with Coban as directed. Secured With: [employer], 4.5x3.1 (in/yd) (DME) (Generic) Every Other Day/15 Days Discharge Instructions: Secure with Kerlix as directed. Secured With: 74M Medipore H Soft Cloth Surgical T ape, 4 x 10 (in/yd) (DME) (Generic) Every Other Day/15 Days Discharge Instructions: Secure with tape as directed. 05/26/2021: The periwound skin on the right foot is macerated, likely secondary to drainage. This was debrided and the wound saucerized. I have recommended that he apply Max barrier cream, such as Desitin, to the area prior to applying his dressing. We will continue with [facility] antibiotic followed by silver alginate to this site. On the left,there was not any substantial decrease in size from his postdebridement measurements last week, but overall the wound does not appear any worse. He continues to have thick callus surrounding this wound, which I debrided today. We will continue with Hydrofera Blue blue and total contact casting. In addition, I am concerned that the state of his nails presents Max risk to the adjacent digits and Max referral has been placed to podiatry for diabetic nail care. Electronic Signature(s) Signed: 05/26/2021 8:55:27 AM By: [name] MD, FACS Entered By: [name] on 05/26/2021 08:55:27 -------------------------------------------------------------------------------- HxROS Details Patient Name: Date of Service: [name], [name] 05/26/2021 7:30 Max M Medical Record Number:  [id number] Patient Account Number: [id number] Date of Birth/Sex: Treating RN: [birth year] (36 y.o. [name] Primary Care Provider: [name] Other Clinician: Referring Provider: Treating Provider/Extender: [name] in Treatment: 10 Information Obtained From Patient Eyes Medical History: Negative for: Cataracts; Glaucoma; Optic Neuritis Ear/Nose/Mouth/Throat Medical History: Negative for: Chronic sinus problems/congestion; Middle ear problems Hematologic/Lymphatic Medical History: Negative for: Anemia; Hemophilia; Human Immunodeficiency Virus; Lymphedema; Sickle Cell Disease Respiratory Medical History: Negative for: Aspiration; Asthma; Chronic Obstructive Pulmonary Disease (COPD); Pneumothorax; Sleep Apnea; Tuberculosis Cardiovascular Medical History: Negative for: Angina; Arrhythmia; Congestive Heart Failure; Coronary Artery Disease; Deep Vein Thrombosis; Hypertension; Hypotension; Myocardial Infarction; Peripheral Arterial Disease; Peripheral Venous Disease; Phlebitis; Vasculitis Gastrointestinal Medical History: Negative for: Cirrhosis ; Colitis; Crohns; Hepatitis Max; Hepatitis B; Hepatitis C Endocrine Medical History: Positive  for: Type II Diabetes Negative for: Type I Diabetes Time with diabetes: 8 Treated with: Insulin Blood sugar tested every day: No Immunological Medical History: Negative for: Lupus Erythematosus; Raynauds; Scleroderma Integumentary (Skin) Medical History: Negative for: History of Burn Musculoskeletal Medical History: Negative for: Gout; Rheumatoid Arthritis; Osteoarthritis; Osteomyelitis Neurologic Medical History: Negative for: Dementia; Neuropathy; Quadriplegia; Paraplegia; Seizure Disorder Oncologic Medical History: Negative for: Received Chemotherapy; Received Radiation Psychiatric Medical History: Negative for: Anorexia/bulimia; Confinement Anxiety Immunizations Pneumococcal  Vaccine: Received Pneumococcal Vaccination: No Implantable Devices None Hospitalization / Surgery History Type of Hospitalization/Surgery 11/1-11/06/2018- sepsis foot infection 11/4-11/5 02 sats low respiratory distress Family and Social History Cancer: No; Diabetes: No; Hereditary Spherocytosis: No; Hypertension: No; Kidney Disease: No; Lung Disease: No; Seizures: No; Stroke: No; Thyroid Problems: No; Tuberculosis: No; Never smoker; Marital Status - Single; Alcohol Use: Rarely; Drug Use: No History; Caffeine Use: Never; Financial Concerns: No; Food, Clothing or Shelter Needs: No; Support System Lacking: No; Transportation Concerns: No Physician Affirmation I have reviewed and agree with the above information. Electronic Signature(s) Signed: 05/26/2021 9:54:39 AM By: [name] MD, FACS Signed: 05/26/2021 5:17:37 PM By: Baruch Gouty RN, BSN Entered By: [name] on 05/26/2021 08:47:32 -------------------------------------------------------------------------------- Total Contact Cast Details Patient Name: Date of Service: [name], [name] 05/26/2021 7:30 Max M Medical Record Number: [id number] Patient Account Number: [id number] Date of Birth/Sex: Treating RN: 1985-07-17 (36 y.o. [name] Primary Care Provider: [name] Other Clinician: Referring Provider: Treating Provider/Extender: [name] in Treatment: 71 T Contact Cast Applied for Wound Assessment: otal Wound #3 Left,Lateral Foot Performed By: Physician [name], MD Post Procedure Diagnosis Same as Pre-procedure Electronic Signature(s) Signed: 05/26/2021 9:54:39 AM By: [name] MD, FACS Signed: 05/26/2021 5:17:37 PM By: Baruch Gouty RN, BSN Entered By: Baruch Gouty on 05/26/2021 08:22:25 -------------------------------------------------------------------------------- SuperBill Details Patient Name: Date of Service: [name], [name]  05/26/2021 Medical Record Number: [id number] Patient Account Number: [id number] Date of Birth/Sex: Treating RN: 20-Nov-1985 (36 y.o. [name] Primary Care Provider: [name] Other Clinician: Referring Provider: Treating Provider/Extender: [name] in Treatment: 81 Diagnosis Coding ICD-10 Codes Code Description E11.621 Type 2 diabetes mellitus with foot ulcer L97.528 Non-pressure chronic ulcer of other part of left foot with other specified severity L97.518 Non-pressure chronic ulcer of other part of right foot with other specified severity Facility Procedures CPT4 Code: 59935701 Description: 77939 - DEB SUBQ TISSUE 20 SQ CM/< ICD-10 Diagnosis Description L97.518 Non-pressure chronic ulcer of other part of right foot with other specified sev L97.528 Non-pressure chronic ulcer of other part of left foot with other specified seve Modifier: erity rity Quantity: 1 Physician Procedures : CPT4 Code Description Modifier [id number] 30076 - WC PHYS LEVEL 3 - EST PT ICD-10 Diagnosis Description E11.621 Type 2 diabetes mellitus with foot ulcer Quantity: 1 : [id number] - WC PHYS SUBQ TISS 20 SQ CM ICD-10 Diagnosis Description L97.518 Non-pressure chronic ulcer of other part of right foot with other specified severity L97.528 Non-pressure chronic ulcer of other part of left foot with other specified  severity Quantity: 1 Electronic Signature(s) Signed: 05/31/2021 1:20:56 PM By: [name] MD FACS Signed: 05/31/2021 4:54:19 PM By: Baruch Gouty RN, BSN Previous Signature: 05/26/2021 8:59:03 AM Version By: [name] MD, FACS Entered By: Baruch Gouty on 05/31/2021 13:04:55

## 2021-06-02 ENCOUNTER — Encounter (HOSPITAL_BASED_OUTPATIENT_CLINIC_OR_DEPARTMENT_OTHER): Payer: BC Managed Care – PPO | Attending: General Surgery | Admitting: General Surgery

## 2021-06-02 ENCOUNTER — Other Ambulatory Visit: Payer: Self-pay

## 2021-06-02 DIAGNOSIS — L84 Corns and callosities: Secondary | ICD-10-CM | POA: Diagnosis not present

## 2021-06-02 DIAGNOSIS — L97528 Non-pressure chronic ulcer of other part of left foot with other specified severity: Secondary | ICD-10-CM | POA: Insufficient documentation

## 2021-06-02 DIAGNOSIS — M659 Synovitis and tenosynovitis, unspecified: Secondary | ICD-10-CM | POA: Diagnosis not present

## 2021-06-02 DIAGNOSIS — E1151 Type 2 diabetes mellitus with diabetic peripheral angiopathy without gangrene: Secondary | ICD-10-CM | POA: Insufficient documentation

## 2021-06-02 DIAGNOSIS — L97518 Non-pressure chronic ulcer of other part of right foot with other specified severity: Secondary | ICD-10-CM | POA: Diagnosis not present

## 2021-06-02 DIAGNOSIS — L97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: Secondary | ICD-10-CM | POA: Diagnosis not present

## 2021-06-02 DIAGNOSIS — I890 Lymphedema, not elsewhere classified: Secondary | ICD-10-CM | POA: Diagnosis not present

## 2021-06-02 DIAGNOSIS — E11621 Type 2 diabetes mellitus with foot ulcer: Secondary | ICD-10-CM | POA: Insufficient documentation

## 2021-06-02 NOTE — Progress Notes (Signed)
[name], Max ([id number]) Visit Report for 06/02/2021 Chief Complaint Document Details Patient Name: Date of Service: [name] D 06/02/2021 7:45 A M Medical Record Number: [id number] Patient Account Number: [id number] Date of Birth/Sex: Treating RN: 1986/01/12 (36 y.o. [name] Primary Care Provider: [name] Other Clinician: Referring Provider: Treating Provider/Extender: [name] in Treatment: 40 Information Obtained from: Patient Chief Complaint 01/11/2019; patient is here for review of a rather substantial wound over the left fifth plantar metatarsal head extending into the lateral part of his foot 10/24/2019; patient returns to clinic with wounds on his bilateral feet with underlying osteomyelitis biopsy-proven Electronic Signature(s) Signed: 06/02/2021 8:23:31 AM By: [name] MD FACS Entered By: [name] on 06/02/2021 08:23:31 -------------------------------------------------------------------------------- Debridement Details Patient Name: Date of Service: [name], [name] 06/02/2021 7:45 A M Medical Record Number: [id number] Patient Account Number: [id number] Date of Birth/Sex: Treating RN: [birth year] (36 y.o. [name] Primary Care Provider: [name] Other Clinician: Referring Provider: Treating Provider/Extender: [name] in Treatment: 83 Debridement Performed for Assessment: Wound #8 Right,Lateral Foot Performed By: Physician [name], MD Debridement Type: Debridement Severity of Tissue Pre Debridement: Fat layer exposed Level of Consciousness (Pre-procedure): Awake and Alert Pre-procedure Verification/Time Out Yes - 08:10 Taken: Start Time: 08:10 T Area Debrided (L x W): otal 3.2 (cm) x 2.8 (cm) = 8.96 (cm) Tissue and other material debrided: Non-Viable, Callus, Fat, Slough, Slough Level: Skin/Subcutaneous Tissue Debridement Description:  Excisional Instrument: Blade, Forceps Bleeding: Moderate Hemostasis Achieved: Silver Nitrate End Time: 08:13 Procedural Pain: 0 Post Procedural Pain: 0 Response to Treatment: Procedure was tolerated well Level of Consciousness (Post- Awake and Alert procedure): Post Debridement Measurements of Total Wound Length: (cm) 3.2 Width: (cm) 2.8 Depth: (cm) 0.1 Volume: (cm) 0.704 Character of Wound/Ulcer Post Debridement: Improved Severity of Tissue Post Debridement: Fat layer exposed Post Procedure Diagnosis Same as Pre-procedure Electronic Signature(s) Signed: 06/02/2021 8:36:29 AM By: [name] MD FACS Signed: 06/02/2021 5:58:06 PM By: [name] RN, BSN Entered By: [name] on 06/02/2021 08:13:44 -------------------------------------------------------------------------------- HPI Details Patient Name: Date of Service: [name], [name] 06/02/2021 7:45 A M Medical Record Number: [id number] Patient Account Number: [id number] Date of Birth/Sex: Treating RN: 07/05/1985 (36 y.o. 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 He comes in for review of a wound over the plantar fifth metatarsal head extending into the lateral part of the foot. He was followed for this previously by his podiatrist Dr. Cornelius [name]. As the patient tells his story he went to see podiatry first for a swelling he developed on the lateral part of his fifth metatarsal head in May. He states this was "open" by podiatry and the area closed. He was followed up in June and it was again opened callus removed and it closed promptly. There were plans being made for surgery on the fifth metatarsal head in June however his blood sugar was  apparently too high for anesthesia. Apparently the area was debrided and opened again in June and it is never closed since. Looking over the records from podiatry I am really not able to follow this. It was clear when he was first seen it was before 5/14 at that point he already had a wound. By 5/17  the ulcer was resolved. I do not see anything about a procedure. On 5/28 noted to have pre-ulcerative moderate keratosis. X-ray noted 1/5 contracted toe and tailor's bunion and metatarsal deformity. On a visit date on 09/28/2018 the dorsal part of the left foot it healed and resolved. There was concern about swelling in his lower extremity he was sent to the ER.. As far as I can tell he was seen in the ER on 7/12 with an ulcer on his left foot. A DVT rule out of the left leg was negative. I do not think I have complete records from podiatry but I am not able to verify the procedures this patient states he had. He states after the last procedure the wound has never closed although I am not able to follow this in the records I have from podiatry. He has not had a recent x-ray The patient has been using Neosporin on the wound. He is wearing a Darco shoe. He is still very active up on his foot working and exercising. Past medical history; type 2 diabetes ketosis-prone, leg swelling with a negative DVT study in July. Non-smoker ABI in our clinic was 0.85 on the left 10/16; substantial wound on the plantar left fifth met head extending laterally almost to the dorsal fifth MTP. We have been using silver alginate we gave him a Darco forefoot off loader. An x-ray did not show evidence of osteomyelitis did note soft tissue emphysema which I think was due to gas tracking through an open wound. There is no doubt in my mind he requires an MRI 10/23; MRI not booked until 3 November at the earliest this is largely due to his glucose sensor in the right arm. We have been using silver alginate. There has been an  improvement 10/29; I am still not exactly sure when his MRI is booked for. He says it is the third but it is the 10th in epic. This definitely needs to be done. He is running a low-grade fever today but no other symptoms. No real improvement in the 1 02/26/2019 patient presents today for a follow-up visit here in our clinic he is last been seen in the clinic on October 29. Subsequently we were working on getting MRI to evaluate and see what exactly was going on and where we would need to go from the standpoint of whether or not he had osteomyelitis and again what treatments were going be required. Subsequently the patient ended up being admitted to the hospital on 02/07/2019 and was discharged on 02/14/2019. This is a somewhat interesting admission with a discharge diagnosis of pneumonia due to COVID-19 although he was positive for COVID-19 when tested at the urgent care but negative x2 when he was actually in the hospital. With that being said he did have acute respiratory failure with hypoxia and it was noted he also have a left foot ulceration with osteomyelitis. With that being said he did require oxygen for his pneumonia and I level 4 L. He was placed on antivirals and steroids for the COVID-19. He was also transferred to the [facility] at one point. Nonetheless he did subsequently discharged home and since being home has done much better in that regard. The CT angiogram did not show any pulmonary embolism. With regard to the osteomyelitis the patient was placed on vancomycin and Zosyn while in the hospital but has been changed to Augmentin at discharge. It was also recommended that he follow- up with wound care and podiatry.  Podiatry however wanted him to see [name] according to the patient prior to them doing anything further. His hemoglobin A1c was 9.9 as noted in the hospital. Have an MRI of the left foot performed while in the hospital on 02/04/2019. 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 There is an overlying plantar open wound noted an abscess tracking back along the lateral aspect of the fifth metatarsal shaft. There is otherwise diffuse cellulitis and mild fasciitis without findings of polymyositis. The patient did have recently pneumonia secondary to COVID-19 I looked in the chart through epic and it does appear that the patient may need to have an additional x-ray just to ensure everything is cleared and that he has no airspace disease prior to putting him into the [name]. 03/05/2019; patient was readmitted to the clinic last week. He was hospitalized twice for a viral upper respiratory tract infection from 11/1 through 11/4 and then 11/5 through 11/12 ultimately this turned out to be Covid pneumonitis. Although he was discharged on oxygen he is not using it. He says he feels fine. He has no exercise limitation no cough no sputum. His O2 sat in our clinic today was 100% on room air. 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 He received Vanco and Zosyn in the hospital and then was discharged on 2 weeks of Augmentin. I do not see any relevant cultures. He was supposed to follow-up with infectious disease but I do not see that he has an appointment. 12/8; patient saw Dr. Novella Olive of infectious disease last week. He felt that he had had adequate antibiotic therapy. He did not go to follow-up with Dr. Amalia Hailey of podiatry and I have again talked to him about the pros and cons of this. He does not want to consider a ray amputation of this time. He is aware of the risks of recurrence, migration etc. He started HBO today and tolerated this well. He can complete the Augmentin that I gave him last week. I have looked over the lab work that Dr. Chana Bode ordered his C-reactive protein  was 3.3 and his sedimentation rate was 17. The C-reactive protein is never really been measurably that high in this patient 12/15; not much change in the wound today however he has undermining along the lateral part of the foot again more extensively than last week. He has some rims of epithelialization. We have been using silver alginate. He is undergoing hyperbarics but did not dive today 12/18; in for his obligatory first total contact cast change. Unfortunately there was pus coming from the undermining area around his fifth metatarsal head. This was cultured but will preclude reapplication of a cast. He is seen in conjunction with HBO 12/24; patient had staph lugdunensis in the wound in the undermining area laterally last time. We put him on doxycycline which should have covered this. The wound looks better today. I am going to give him another week of doxycycline before reattempting the total contact cast 12/31; the patient is completing antibiotics. Hemorrhagic debris in the distal part of the wound with some undermining distally. He also had hyper granulation. Extensive debridement with a #5 curette. The infected area that was on the lateral part of the fifth met head is closed over. I do not think he needs any more antibiotics. Patient was  seen prior to HBO. Preparations for a total contact cast were made in the cast will be placed post hyperbarics 04/11/19; once again the patient arrives today without complaint. He had been in a cast all week noted that he had heavy drainage this week. This resulted in large raised areas of macerated tissue around the wound 1/14; wound bed looks better slightly smaller. Hydrofera Blue has been changing himself. He had a heavy drainage last week which caused a lot of maceration around the wound so I took him out of a total contact cast he says the drainage is actually better this week He is seen today in conjunction with HBO 1/21; returns to clinic. He was up  in [place] for a day or 2 attending a funeral. He comes back in with the wound larger and with a large area of exposed bone. He had osteomyelitis and septic arthritis of the fifth left metatarsal head while he was in hospital. He received IV antibiotics in the hospital for a prolonged period of time then 3 weeks of Augmentin. Subsequently I gave him 2 weeks of doxycycline for more superficial wound infection. When I saw this last week the wound was smaller the surface of the wound looks satisfactory. 1/28; patient missed hyperbarics today. Bone biopsy I did last time showed Enterococcus faecalis and Staphylococcus lugdunensis . He has a wide area of exposed bone. We are going to use silver alginate as of today. I had another ethical discussion with the patient. 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 In this situation I think the likelihood of healing this is low. Therefore I have recommended a ray amputation and with the patient's agreement I have referred him to Dr. Doran Durand. The other issue is that his compliance with hyperbarics has been minimal because of his work schedule and given his underlying decision I am going to stop this today READMISSION 10/24/2019 MRI 09/29/2019 left foot IMPRESSION: 1. Apparent skin ulceration inferior and lateral to the 5th metatarsal base with underlying heterogeneous T2 signal and enhancement in the subcutaneous fat. Small peripherally enhancing fluid collections along the plantar and lateral aspects of the 5th metatarsal base suspicious for abscesses. 2. Interval amputation through the mid 5th metatarsal with nonspecific low-level marrow edema and enhancement. Given the proximity to the adjacent soft tissue inflammatory changes, osteomyelitis cannot be excluded. 3. The additional bones appear unremarkable. MRI 09/29/2019 right foot IMPRESSION: 1. Soft tissue ulceration lateral to the 5th MTP joint. There is low-level T2  hyperintensity within the 4th and 5th metatarsal heads and adjacent proximal phalanges without abnormal T1 signal or cortical destruction. These findings are nonspecific and could be seen with early marrow edema, hyperemia or early osteomyelitis. No evidence of septic joint. 2. Mild tenosynovitis and synovial enhancement associated with the extensor digitorum tendons at the level of the midfoot. 3. Diffuse low-level muscular T2 hyperintensity and enhancement, most consistent with diabetic myopathy. LEFT FOOT BONE Methicillin resistant staphylococcus aureus Staphylococcus lugdunensis MIC MIC CIPROFLOXACIN >=8 RESISTANT Resistant <=0.5 SENSI... Sensitive CLINDAMYCIN <=0.25 SENS... Sensitive >=8 RESISTANT Resistant ERYTHROMYCIN >=8 RESISTANT Resistant >=8 RESISTANT Resistant GENTAMICIN <=0.5 SENSI... Sensitive <=0.5 SENSI... Sensitive Inducible Clindamycin NEGATIVE Sensitive NEGATIVE Sensitive OXACILLIN >=4 RESISTANT Resistant 2 SENSITIVE Sensitive RIFAMPIN <=0.5 SENSI... Sensitive <=0.5 SENSI... Sensitive TETRACYCLINE <=1 SENSITIVE Sensitive <=1 SENSITIVE Sensitive TRIMETH/SULFA <=10 SENSIT Sensitive <=10 SENSIT Sensitive ... Marland Kitchen.. VANCOMYCIN 1 SENSITIVE Sensitive <=0.5 SENSI... Sensitive Right foot bone . Component 3 wk ago Specimen Description BONE Special Requests RIGHT 4 METATARSAL  SAMPLE B Gram Stain NO WBC SEEN NO ORGANISMS SEEN Culture RARE METHICILLIN RESISTANT STAPHYLOCOCCUS AUREUS NO ANAEROBES ISOLATED Performed at [facility], [place] [address], [place], [place] [address] Report Status 10/08/2019 FINAL Organism ID, Bacteria METHICILLIN RESISTANT STAPHYLOCOCCUS AUREUS Resulting Agency [facility] Susceptibility Methicillin resistant staphylococcus aureus MIC CIPROFLOXACIN >=8 RESISTANT Resistant CLINDAMYCIN <=0.25 SENS... Sensitive ERYTHROMYCIN >=8 RESISTANT Resistant GENTAMICIN <=0.5 SENSI... Sensitive Inducible Clindamycin NEGATIVE Sensitive OXACILLIN >=4  RESISTANT Resistant RIFAMPIN <=0.5 SENSI... Sensitive TETRACYCLINE <=1 SENSITIVE Sensitive TRIMETH/SULFA <=10 SENSIT Sensitive ... VANCOMYCIN 1 SENSITIVE Sensitive This is a patient we had in clinic earlier this year with a wound over his left fifth metatarsal head. 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 Doran Durand on 05/16/2019. He seems to have developed problems with wounds on his bilateral feet in June 2021 although he may have had problems earlier than this. He was in an urgent care with a right foot ulcer on 09/26/2019 and given a course of doxycycline. This was apparently after having trouble getting into see orthopedics. He was seen by podiatry on 09/28/2019 noted to have bilateral lower extremity ulcers including the left lateral fifth metatarsal base and the right subfifth met head. It was noted that had purulent drainage at that time. He required hospitalization from 6/20 through 7/2. This was because of worsening right foot wounds. He underwent bilateral operative incision and drainage and bone biopsies bilaterally. Culture results are listed above. He has been referred back to clinic by Dr. Jacqualyn Posey of podiatry. He is also followed by Dr. Megan Salon who saw him yesterday. He was discharged from hospital on Zyvox Flagyl and Levaquin and yesterday changed to doxycycline Flagyl and Levaquin. His inflammatory markers on 6/26 showed a sedimentation rate of 129 and a C-reactive protein of 5. This is improved to 14 and 1.3 respectively. This would indicate improvement. ABIs in our clinic today were 1.23 on the right and 1.20 on the left 11/01/2019 on evaluation today patient appears to be doing fairly well in regard to  the wounds on his feet at this point. Fortunately there is no signs of active infection at this time. No fevers, chills, nausea, vomiting, or diarrhea. He currently is seeing infectious disease and still under their care at this point. Subsequently he also has both wounds which she has not been using collagen on as he did not receive that in his packaging he did not call us and let us know that. Apparently that just was missed on the order. Nonetheless we will get that straightened out today. 8/9-Patient returns for bilateral foot wounds, using Prisma with hydrogel moistened dressings, and the wounds appear stable. Patient using surgical shoes, avoiding much pressure or weightbearing as much as possible 8/16; patient has bilateral foot wounds. 1 on the right lateral foot proximally the other is on the left mid lateral foot. Both required debridement of callus and thick skin around the wounds. We have been using silver collagen 8/27; patient has bilateral lateral foot wounds. The area on the left substantially surrounded by callus and dry skin. This was removed from the wound edge. The underlying wound is small. The area on the right measured somewhat smaller today. We've been using silver collagen the patient was on antibiotics for  underlying osteomyelitis in the left foot. Unfortunately I did not update his antibiotics during today's visit. 9/10 I reviewed Dr. Hale Bogus last notes he felt he had completed antibiotics his inflammatory markers were reasonably well controlled. He has a small wound on the lateral left foot and a tiny area on the right which is just above closed. He is using Hydrofera Blue with border foam he has bilateral surgical shoes 9/24; 2 week f/u. doing well. right foot is closed. left foot still undermined. 10/14; right foot remains closed at the fifth met head. The area over the base of the left fifth metatarsal has a small open area but considerable undermining towards the  plantar foot. Thick callus skin around this suggests an adequate pressure relief. We have talked about this. He says he is going to go back into his cam boot. I suggested a total contact cast he did not seem enamored with this suggestion 10/26; left foot base of the fifth metatarsal. Same condition as last time. He has skin over the area with an open wound however the skin is not adherent. He went to see Dr. Earleen Newport who did an x-ray and culture of his foot I have not reviewed the x-ray but the patient was not told anything. He is on doxycycline 11/11; since the patient was last here he was in the emergency room on 10/30 he was concerned about swelling in the left foot. They did not do any cultures or x-rays. They changed his antibiotics to cephalexin. Previous culture showed group B strep. The cephalexin is appropriate as doxycycline has less than predictable coverage. Arrives in clinic today with swelling over this area under the wound. He also has a new wound on the right fifth metatarsal head 11/18; the patient has a difficult wound on the lateral aspect of the left fifth metatarsal head. The wound was almost ballotable last week I opened it slightly expecting to see purulence however there was just bleeding. I cultured this this was negative. X-ray unchanged. We are trying to get an MRI but I am not sure were going to be able to get this through his insurance. He also has an area on the right lateral fifth metatarsal head this looks healthier 12/3; the patient finally got our MRI. Surprisingly this did not show osteomyelitis. I did show the soft tissue ulceration at the lateral plantar aspect of the fifth metatarsal base with a tiny residual 6 mm abscess overlying the superficial fascia I have tried to culture this area I have not been able to get this to grow anything. Nevertheless the protruding tissue looks aggravated. I suspect we should try to treat the underlying "abscess with broad-spectrum  antibiotics. I am going to start him on Levaquin and Flagyl. He has much less edema in his legs and I am going to continue to wrap his legs and see him weekly 12/10. I started Levaquin and Flagyl on him last week. He just picked up the Flagyl apparently there was some delay. The worry is the wound on the left fifth metatarsal base which is substantial and worsening. His foot looks like he inverts at the ankle making this a weightbearing surface. Certainly no improvement in fact I think the measurements of this are somewhat worse. We have been using 12/17; he apparently just got the Levaquin yesterday this is 2 weeks after the fact. He has completed the Flagyl. The area over the left fifth metatarsal base still has protruding granulation tissue although it does not look  quite as bad as it did some weeks ago. He has severe bilateral lymphedema although we have not been treating him for wounds on his legs this is definitely going to require compression. There was so much edema in the left I did not wish to put him in a total contact cast today. I am going to increase his compression from 3-4 layer. The area on the right lateral fifth met head actually look quite good and superficial. 12/23; patient arrived with callus on the right fifth met head and the substantial hyper granulated callused wound on the base of his fifth metatarsal. He says he is completing his Levaquin in 2 days but I do not think that adds up with what I gave him but I will have to double check this. We are using Hydrofera Blue on both areas. My plan is to put the left leg in a cast the week after New Year's 04/06/2020; patient's wounds about the same. Right lateral fifth metatarsal head and left lateral foot over the base of the fifth metatarsal. There is undermining on the left lateral foot which I removed before application of total contact cast continuing with Hydrofera Blue new. Patient tells me he was seen by endocrinology today  lab work was done [Dr. Kerr]. Also wondering whether he was referred to cardiology. I went over some lab work from previously does not have chronic renal failure certainly not nephrotic range proteinuria he does have very poorly controlled diabetes but this is not his most updated lab work. Hemoglobin A1c has been over 11 1/10; the patient had a considerable amount of leakage towards mid part of his left foot with macerated skin however the wound surface looks better the area on the right lateral fifth met head is better as well. I am going to change the dressing on the left foot under the total contact cast to silver alginate, continue with Hydrofera Blue on the right. 1/20; patient was in the total contact cast for 10 days. Considerable amount of drainage although the skin around the wound does not look too bad on the left foot. The area on the right fifth metatarsal head is closed. Our nursing staff reports large amount of drainage out of the left lateral foot wound 1/25; continues with copious amounts of drainage described by our intake staff. PCR culture I did last week showed E. coli and Enterococcus faecalis and low quantities. Multiple resistance genes documented including extended spectrum beta lactamase, MRSA, MRSE, quinolone, tetracycline. The wound is not quite as good this week as it was 5 days ago but about the same size 2/3; continues with copious amounts of malodorous drainage per our intake nurse. The PCR culture I did 2 weeks ago showed E. coli and low quantities of Enterococcus. There were multiple resistance genes detected. I put Neosporin on him last week although this does not seem to have helped. The wound is slightly deeper today. Offloading continues to be an issue here although with the amount of drainage she has a total contact cast is just not going to work 2/10; moderate amount of drainage. Patient reports he cannot get his stocking on over the dressing. I told him we have  to do that the nurse gave him suggestions on how to make this work. The wound is on the bottom and lateral part of his left foot. Is cultured predominantly grew low amounts of Enterococcus, E. coli and anaerobes. There were multiple resistance genes detected including extended spectrum beta lactamase, quinolone,  tetracycline. I could not think of an easy oral combination to address this so for now I am going to do topical antibiotics provided by [facility] I think the main agents here are vancomycin and an aminoglycoside. We have to be able to give him access to the wounds to get the topical antibiotic on 2/17; moderate amount of drainage this is unchanged. He has his Keystone topical antibiotic against the deep tissue culture organisms. He has been using this and changing the dressing daily. Silver alginate on the wound surface. 2/24; using Keystone antibiotic with silver alginate on the top. He had too much drainage for a total contact cast at one point although I think that is improving and I think in the next week or 2 it might be possible to replace a total contact cast I did not do this today. In general the wound surface looks healthy however he continues to have thick rims of skin and subcutaneous tissue around the wide area of the circumference which I debrided 06/04/2020 upon evaluation today patient appears to be doing well in regard to his wound. I do feel like he is showing signs of improvement. There is little bit of callus and dead tissue around the edges of the wound as well as what appears to be a little bit of a sinus tract that is off to the side laterally I would perform debridement to clear that away today. 3/17; left lateral foot. The wound looks about the same as I remember. Not much depth surface looks healthy. No evidence of infection 3/25; left lateral foot. Wound surface looks about the same. Separating epithelium from the circumference. There really is no evidence of infection  here however not making progress by my view 3/29; left lateral foot. Surface of the wound again looks reasonably healthy still thick skin and subcutaneous tissue around the wound margins. There is no evidence of infection. One of the concerns being brought up by the nurses has again the amount of drainage vis--vis continued use of a total contact cast 4/5; left lateral foot at roughly the base of the fifth metatarsal. Nice healthy looking granulated tissue with rims of epithelialization. The overall wound measurements are not any better but the tissue looks healthy. The only concern is the amount of drainage although he has no surrounding maceration with what we have been doing recently to absorb fluid and protect his skin. He also has lymphedema. 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 He states that the kerlix/coban under the TCC rolled down and could not get it back up. He has been using an offloading scooter and has somehow hurt his right foot using this device. This happened last week. He states that the side of his right foot developed a blister and opened. The top of his foot also has a few small open wounds he thinks is due to his socks rubbing in his shoes. He has not been using any dressings to the wound. He denies purulent drainage, fever/chills or erythema to the wounds. 4/22; patient presents for 1 week follow-up. He developed new wounds to the right foot that were evaluated at last clinic visit. He continues to have a total contact cast to the left leg and he  reports no issues. He has been using silver collagen to the right foot wounds with no issues. He denies purulent drainage, fever/chills or erythema to the right foot wounds. He has no  complaints today 4/25; patient presents for 1 week follow-up. He has a total contact cast of the left leg and reports no issues. He has been using silver alginate to the right foot wound. He denies purulent drainage, fever/chills or erythema to the right foot wounds. 5/2 patient presents for 1 week follow-up. T contact cast on the left. The wound which is on the base of the plantar foot at the base of the fifth metatarsal otal actually looks quite good and dimensions continue to gradually contract. HOWEVER the area on the right lateral fifth metatarsal head is much larger than what I remember from 2 weeks ago. Once more is he has significant levels of hypergranulation. Noteworthy that he had this same hyper granulated response on his wound on the left foot at one point in time. So much so that he I thought there was an underlying fluid collection. Based on this I think this just needs debridement. 5/9; the wound on the left actually continues to be gradually smaller with a healthy surface. Slight amount of drainage and maceration of the skin around but not too bad. However he has a large wound over the right fifth metatarsal head very much in the same configuration as his left foot wound was initially. I used silver nitrate to address the hyper granulated tissue no mechanical debridement 5/16; area on the left foot did not look as healthy this week deeper thick surrounding macerated skin and subcutaneous tissue. The area on the right foot fifth met head was about the same The area on the right ankle that we identified last week is completely broken down into an open wound presumably a stocking rubbing issue 5/23; patient has been using a total contact cast to the left side. He has been using silver alginate underneath. He has also been using silver alginate to the right foot wounds. He has no complaints today. He denies any signs of infection. 5/31; the left-sided wound looks some better measure  smaller surface granulation looks better. We have been using silver alginate under the total contact cast The large area on his right fifth met head and right dorsal foot look about the same still using silver alginate 6/6; neither side is good as I was hoping although the surface area dimensions are better. A lot of maceration on his left and right foot around the wound edge. Area on the dorsal right foot looks better. He says he was traveling. I am not sure what does the amount of maceration around the plantar wounds may be drainage issues 6/13; in general the wound surfaces look quite good on both sides. Macerated skin and raised edges around the wound required debridement although in general especially on the left the surface area seems improved. The area on the right dorsal ankle is about the same I thought this would not be such a problem to close 6/20; not much change in either wound although the one on the right looks a little better. Both wounds have thick macerated edges to the skin requiring debridements. We have been using silver alginate. The area on the dorsal right ankle is still open I thought this would be closed. 6/28; patient comes in today with a marked deterioration in the right foot wound fifth met head. Wide area of exposed bone this is  a drastic change from last time. The area on the left there we have been casting is stagnant. We have been using silver alginate in both wound areas. 7/5; bone culture I did for PCR last time was positive for Pseudomonas, group B strep, Enterococcus and Staph aureus. There was no suggestion of methicillin resistance or ampicillin resistant genes. This was resistant to tetracycline however He comes into the clinic today with the area over his right plantar fifth metatarsal head which had been doing so well 2 weeks ago completely necrotic feeling bone. I do not know that this is going to be salvageable. The left foot wound is certainly no smaller  but it has a better surface and is superficial. 7/8; patient called in this morning to say that his total contact cast was rubbing against his foot. He states he is doing fine overall. He denies signs of infection. 7/12; continued deterioration in the wound over the right fifth metatarsal head crumbling bone. This is not going to be salvageable. The patient agrees and wants to be referred to Dr. Doran Durand which we will attempt to arrange as soon as possible. I am going to continue him on antibiotics as long as that takes so I will renew those today. The area on the left foot which is the base of the fifth metatarsal continues to look somewhat better. Healthy looking tissue no depth no debridement is necessary here. 7/20; the patient was kindly seen by Dr. Doran Durand of orthopedics on 10/19/2020. 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 Towards this end we have taken him out of the total contact cast on the left we will put him in a wrap with Hydrofera Blue. As I understand things surgery is planned for 7/21 7/27; patient had his surgery last Thursday. 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 Apparently everything went well we did not still disturb that today The area on the left foot actually looks quite good. He has been much less mobile which probably explains this he did not seem to do well in the total contact cast secondary to drainage and maceration I think. 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 Fortunately there is no evidence of active infection at this time. No fevers, chills, nausea, vomiting, or diarrhea. Overall I think that he is actually doing extremely well. Nonetheless I do believe that he is staying off of this more following the surgery in his right foot that is the reason the left  is doing so great. 8/16; left plantar foot wound. This looks smaller than the last time I saw this he is using Hydrofera Blue. The surgical wound on the right foot is being followed by Dr. Doran Durand we did not look at this today. He has surgical shoes on both feet 8/23; left plantar foot wound not as good this week. Surrounding macerated skin and subcutaneous tissue everything looks moist and wet. I do not think he is offloading this adequately. He is using a surgical shoe Apparently the right foot surgical wound is not open although I did not check his foot 8/31; left plantar foot lateral aspect. Much improved this week. He has no maceration. Some improvement in the surface area of the wound but most impressively the depth is come in we are  using silver alginate. The patient is a Product/process development scientist. He is asked that we write him a letter so he can go back to work. I have also tried to see if we can write something that will allow him to limit the amount of time that he is on his foot at work. 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 Hopefully the latter can be avoided 9/6; I believe the patient missed an appointment last week. He arrives in today with a wound looking roughly the same certainly no better. Undermining laterally and also inferiorly. We used molecuLight today in training with the patient's permission.. We are using silver alginate 9/21 wound is measuring bigger this week although this may have to do with the aggressive circumferential debridement last week in response to the blush fluorescence on the MolecuLight. Culture I did last week showed significant MSSA and E. coli. I put him on Augmentin but he has not started it yet. We are also going to send this for compounded antibiotics at [facility]. There is no evidence of systemic infection 9/29; silver alginate. His Keystone arrived. He is completing  Augmentin in 2 days. Offloading in a cam boot. Moderate drainage per our intake staff 10/5; using silver alginate. He has been using his [place]. He has completed his Augmentin. Per our intake nurse still a lot of drainage, far too much to consider a total contact cast. Wound measures about the same. He had the same undermining area that I defined last week from a roughly 11-3. I remove this today 10/12; using silver alginate he is using the [place]. He comes in for a nurse visit hence we are applying [employer] twice a week. Measuring slightly better today and less notable drainage. Extensive debridement of the wound edge last time 10/18; using topical Keystone and silver alginate and a soft cast. Wound measurements about the same. Drainage was through his soft cast. We are changing this twice a week Tuesdays and Friday 10/25; comes in with moderate drainage. Still using Keystone silver alginate and a soft cast. Wound dimensions completely the same.He has a lot of edema in the left leg he has lymphedema. Asking for [name] to consider wrapping him as he cannot get his stocking on over the soft cast 11/2; comes in with moderate to large drainage slightly smaller in terms of width we have been using [place]. His wound looks satisfactory but not much improvement 11/4; patient presents today for obligatory cast change. Has no issues or complaints today. He denies signs of infection. 11/9; patient traveled this weekend to [place], was on the cast quite a bit. Staining of the cast with black material from his walking boot. Drainage was not quite as bad as we feared. Using silver alginate and Keystone 11/16; we do not have size for cast therefore we have been putting a soft cast on him since the change on Friday. Still a significant amount of drainage necessitating changing twice a week. We have been using the Keystone at cast changes either hard or soft as well as silver alginate Comes in the clinic with things  actually looking fairly good improvement in width. He says his offloading is about the same 02/24/2021 upon evaluation today patient actually comes back in and is doing excellent in regard to his foot ulcer this is significantly smaller even compared to the last visit. The soft cast seems to have done extremely well  for him which is great news. I do not see any signs of infection minimal debridement will be needed today. 11/30; left lateral foot much improved half a centimeter improvement in surface area. No evidence of infection. He seems to be doing better with the soft cast in the TCC therefore we will continue with this. He comes back in later in the week for a change with the nurses. This is due to drainage 12/6; no improvement in dimensions. Under illumination some debris on the surface we have been using silver alginate, soft cast. If there is anything optimistic here he seems to have have less drainage 12/13. Dimensions are improved both length and width and slightly in depth. Appears to be quite healthy today. Raised edges of this thick skin and callus around the edges however. He is in a soft cast were bringing him back once for a change on Friday. Drainage is better 12/20. Dimensions are improved. He still has raised edges of thick skin and callus around the edges. We are using a soft cast 12/28; comes in today with thick callus around the wound. Using silver under alginate under a soft cast. I do not think there is much improvement in any measurement 2023 04/06/2021; patient was put in a total contact cast. Unfortunately not much change in surface area 1/10; not much different still thick callus and skin around the edge in spite of the total contact cast. This was just debrided last week we have been using the [facility] compounded antibiotic and silver alginate under a total contact cast 1/18 the patient's wound on the left side is doing nicely. smaller HOWEVER he comes in today with a  wound on the right foot laterally. blister most likely serosangquenous drainage 1/24; the patient continues to do well in terms of the plantar left foot which is continued to contract using silver alginate under the total contact cast HOWEVER the right lateral foot is bigger with denuded skin around the edges. I used pickups and a #15 scalpel to remove this this looks like the remanence of a large blister. Cannot rule out infection. Culture in this area I did last week showed Staphylococcus lugdunensis few colonies. I am going to try to address this with his [employer] antibiotic that is done so well on the left having linezolid and this should cover the staph 2/1; the patient's wound on his left foot which was the original plantar foot wound thick skin and eschar around the edges even in the total contact cast but the wound surface does not look too bad The real problem is on how his right lateral foot at roughly the base of the fifth metatarsal. The wound is completely necrotic more worrisome than that there is swelling around the edges of this. We have been using silver alginate on both wounds and Keystone on the right foot. Unfortunately I think he is going to require systemic antibiotics while we await cultures. He did not get the x-ray done that we ordered last week [lost the prescription 2/7; disappointingly in the area on the left foot which we are treating with a total contact cast is still not closed although it is much smaller. He continues to have a lot of callus around the wound edge. -Right lateral foot culture I did last week was negative x-ray also negative for osteomyelitis. 2/15: TCC silver alginate on the left and silver alginate on the right lateral. No real improvement in either area 05/26/2021: T oday, the wounds are roughly the  same size as at his previous visit, post-debridement. He continues to endorse fairly substantial drainage, particularly on the right. He has been in a total  contact cast on the left. There is still some callus surrounding this lesion. On the right, the periwound skin is quite macerated, along with surrounding callus. The center of the right-sided wound also has some dark, densely adherent material, which is very difficult to remove. 06/02/2021: Today, both wounds are slightly smaller. He has been using zinc oxide ointment around the right ulcer and the degree of maceration has improved markedly. There continues to be an area of nonviable tissue in the center of the right sided ulcer. The left-sided wound, which has been in the total contact cast. Appears clean and the degree of callus around it is less than previously. Electronic Signature(s) Signed: 06/02/2021 8:25:44 AM By: [name] MD FACS Entered By: [name] on 06/02/2021 08:25:44 -------------------------------------------------------------------------------- Physical Exam Details Patient Name: Date of Service: [name], [name] 06/02/2021 7:45 A M Medical Record Number: [id number] Patient Account Number: [id number] Date of Birth/Sex: Treating RN: 11-Apr-1985 (36 y.o. [name] Primary Care Provider: [name] Other Clinician: Referring Provider: Treating Provider/Extender: [name] in Treatment: 17 Constitutional . . . No acute distress. Respiratory Normal work of breathing on room air. Notes 06/02/2021: Wound evaluationthe degree of maceration around the right sided ulcer has improved markedly with the use of Desitin ointment on the periwound tissue. There is still heavy callus present. The base, unfortunately, still appears suboptimal with a central area of extremely pale fat. This wound was debrided to remove callus and some of the necrotic tissue in the center was also removed, but this is extremely difficult to debride. On the left, the total contact cast appears to be doing its job and the wound is smaller. The callus is also less  than on previous visits and no debridement was required on this side. Electronic Signature(s) Signed: 06/02/2021 8:28:45 AM By: [name] MD FACS Entered By: [name] on 06/02/2021 08:28:45 -------------------------------------------------------------------------------- Physician Orders Details Patient Name: Date of Service: [name], [name] 06/02/2021 7:45 A M Medical Record Number: [id number] Patient Account Number: [id number] Date of Birth/Sex: Treating RN: 03-14-1986 (36 y.o. 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 - Dr. [name] Return A Bathing/ Shower/ Hygiene May shower with protection but do not get wound dressing(s) wet. - Use Cast Protector Bag Edema Control - Lymphedema / SCD / Other Bilateral Lower Extremities Elevate legs to the level of the heart or above for 30 minutes daily and/or when sitting, a frequency of: - throughout the day Avoid standing for long periods of time. Exercise regularly Moisturize legs daily. - right leg every night before bed. Compression stocking or Garment 20-30 mm/Hg pressure to: - Apply to right leg in the morning and remove at night. Off-Loading Total Contact Cast to Left Lower Extremity Open toe surgical shoe to: - right foot Other: - minimal weight bearing left foot Additional Orders / Instructions Follow Nutritious Diet Wound Treatment Wound #3 - Foot Wound Laterality: Left, Lateral Cleanser:  Soap and Water 1 x Per Week/7 Days Discharge  Instructions: May shower and wash wound with dial antibacterial soap and water prior to dressing change. Topical: Keystone antibiotic compound 1 x Per Week/7 Days Discharge Instructions: Apply to wound bed Prim Dressing: Hydrofera Blue Classic Foam, 2x2 in 1 x Per Week/7 Days ary Discharge Instructions: Moisten with saline prior to applying to wound bed Secondary Dressing: Woven Gauze Sponge, Non-Sterile 4x4 in 1 x Per Week/7 Days Discharge Instructions: Apply over primary dressing as directed. Secondary Dressing: Optifoam Non-Adhesive Dressing, 4x4 in 1 x Per Week/7 Days Discharge Instructions: Apply over primary dressing as directed. Compression Wrap: Kerlix Roll 4.5x3.1 (in/yd) 1 x Per Week/7 Days Discharge Instructions: Apply Kerlix and Coban compression as directed. Compression Wrap: Coban Self-Adherent Wrap 4x5 (in/yd) 1 x Per Week/7 Days Discharge Instructions: Apply over Kerlix as directed. Wound #8 - Foot Wound Laterality: Right, Lateral Cleanser: Soap and Water Every Other Day/15 Days Discharge Instructions: May shower and wash wound with dial antibacterial soap and water prior to dressing change. Cleanser: [place] Ancillary Kit - 15 Day Supply (DME) (Generic) Every Other Day/15 Days Discharge Instructions: Use supplies as instructed; Kit contains: (15) Saline Bullets; (15) 3x3 Gauze; 15 pr Gloves Peri-Wound Care: Zinc Oxide Ointment 30g tube Every Other Day/15 Days Discharge Instructions: Apply Zinc Oxide to periwound with each dressing change Peri-Wound Care: Sween Lotion (Moisturizing lotion) Every Other Day/15 Days Discharge Instructions: Apply moisturizing lotion as directed Prim Dressing: Hydrofera Blue Classic Foam, 4x4 in (DME) (Generic) Every Other Day/15 Days ary Discharge Instructions: Moisten with saline prior to applying to wound bed Prim Dressing: Santyl Ointment Every Other Day/15 Days ary Discharge Instructions: Apply nickel thick amount to wound bed, cover with  Hydrofera Secondary Dressing: Woven Gauze Sponge, Non-Sterile 4x4 in (DME) (Generic) Every Other Day/15 Days Discharge Instructions: Apply over primary dressing as directed. Secondary Dressing: Optifoam Non-Adhesive Dressing, 4x4 in (DME) (Generic) Every Other Day/15 Days Discharge Instructions: Apply over primary dressing as directed. Secured With: Coban Self-Adherent Wrap 4x5 (in/yd) (DME) (Generic) Every Other Day/15 Days Discharge Instructions: Secure with Coban as directed. Secured With: [employer], 4.5x3.1 (in/yd) (DME) (Generic) Every Other Day/15 Days Discharge Instructions: Secure with Kerlix as directed. Secured With: 28M Medipore H Soft Cloth Surgical T ape, 4 x 10 (in/yd) (DME) (Generic) Every Other Day/15 Days Discharge Instructions: Secure with tape as directed. Patient Medications llergies: metformin A Notifications Medication Indication Start End 06/02/2021 Santyl DOSE topical 250 unit/gram ointment - apply to wound as instructed Electronic Signature(s) Signed: 06/02/2021 8:36:29 AM By: [name] MD FACS Previous Signature: 06/02/2021 8:30:30 AM Version By: [name] MD FACS Entered By: [name] on 06/02/2021 08:32:45 -------------------------------------------------------------------------------- Problem List Details Patient Name: Date of Service: [name], [name] 06/02/2021 7:45 A M Medical Record Number: [id number] Patient Account Number: [id number] Date of Birth/Sex: Treating RN: [birth year] (36 y.o. [name] Primary Care Provider: [name] Other Clinician: Referring Provider: Treating Provider/Extender: [name] in Treatment: 4 Active Problems ICD-10 Encounter Code Description Active Date MDM Diagnosis E11.621 Type 2 diabetes mellitus with foot ulcer 10/24/2019 No Yes L97.528 Non-pressure chronic ulcer of other part of left foot with other specified 10/24/2019 No Yes severity L97.518  Non-pressure chronic ulcer of other part of right foot with other specified 10/24/2019 No Yes severity Inactive Problems ICD-10 Code Description Active Date Inactive Date L97.518 Non-pressure chronic ulcer of other part of right foot with other specified severity 07/14/2020 07/14/2020 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,  right ankle and foot 10/24/2019 10/24/2019 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 08/10/2020 08/10/2020 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, left ankle and foot 10/24/2019 10/24/2019 B95.62 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 10/24/2019 10/24/2019 classified elsewhere Resolved Problems Electronic Signature(s) Signed: 06/02/2021 8:22:49 AM By: [name] MD FACS Entered By: [name] on 06/02/2021 08:22:49 -------------------------------------------------------------------------------- Progress Note Details Patient Name: Date of Service: [name], [name] 06/02/2021 7:45 A M Medical Record Number: [id number] Patient Account Number: [id number] Date of Birth/Sex: Treating RN: [birth year] (36 y.o. 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 He comes in for review of a wound over the plantar fifth metatarsal head extending into the lateral part of the foot. He was followed for this  previously by his podiatrist Dr. Cornelius [name]. As the patient tells his story he went to see podiatry first for a swelling he developed on the lateral part of his fifth metatarsal head in May. He states this was "open" by podiatry and the area closed. He was followed up in June and it was again opened callus removed and it closed promptly. There were plans being made for surgery on the fifth metatarsal head in June however his blood sugar was apparently too high for anesthesia. Apparently the area was debrided and opened again in June and it is never closed since. Looking over the records from podiatry I am really not able to follow this. It was clear when he was first seen it was before 5/14 at that point he already had a wound. By 5/17 the ulcer was resolved. I do not see anything about a procedure. On 5/28 noted to have pre-ulcerative moderate keratosis. X-ray noted 1/5 contracted toe and tailor's bunion and metatarsal deformity. On a visit date on 09/28/2018 the dorsal part of the left foot it healed and resolved. There was concern about swelling in his lower extremity he was sent to the ER.. As far as I can tell he was seen in the ER on 7/12 with an ulcer on his left foot. A DVT rule out of the left leg was negative. I do not think I have complete records from podiatry but I am not able to verify the procedures this patient states he had. He states after the last procedure the wound has never closed although I am not able to follow this in the records I have from podiatry. He has not had a recent x-ray The patient has been using Neosporin on the wound. He is wearing a Darco shoe. He is still very active up on his foot working and exercising. Past medical history; type 2 diabetes ketosis-prone, leg swelling with a negative DVT study in July. Non-smoker ABI in our clinic was 0.85 on the left 10/16; substantial wound on the plantar left fifth met head extending laterally almost to the dorsal fifth MTP. We  have been using silver alginate we gave him a Darco forefoot off loader. An x-ray did not show evidence  of osteomyelitis did note soft tissue emphysema which I think was due to gas tracking through an open wound. There is no doubt in my mind he requires an MRI 10/23; MRI not booked until 3 November at the earliest this is largely due to his glucose sensor in the right arm. We have been using silver alginate. There has been an improvement 10/29; I am still not exactly sure when his MRI is booked for. He says it is the third but it is the 10th in epic. This definitely needs to be done. He is running a low-grade fever today but no other symptoms. No real improvement in the 1 02/26/2019 patient presents today for a follow-up visit here in our clinic he is last been seen in the clinic on October 29. Subsequently we were working on getting MRI to evaluate and see what exactly was going on and where we would need to go from the standpoint of whether or not he had osteomyelitis and again what treatments were going be required. Subsequently the patient ended up being admitted to the hospital on 02/07/2019 and was discharged on 02/14/2019. This is a somewhat interesting admission with a discharge diagnosis of pneumonia due to COVID-19 although he was positive for COVID-19 when tested at the urgent care but negative x2 when he was actually in the hospital. With that being said he did have acute respiratory failure with hypoxia and it was noted he also have a left foot ulceration with osteomyelitis. With that being said he did require oxygen for his pneumonia and I level 4 L. He was placed on antivirals and steroids for the COVID-19. He was also transferred to the [facility] at one point. Nonetheless he did subsequently discharged home and since being home has done much better in that regard. The CT angiogram did not show any pulmonary embolism. With regard to the osteomyelitis the patient was placed on  vancomycin and Zosyn while in the hospital but has been changed to Augmentin at discharge. It was also recommended that he follow- up with wound care and podiatry. Podiatry however wanted him to see [name] according to the patient prior to them doing anything further. His hemoglobin A1c was 9.9 as noted in the hospital. Have an MRI of the left foot performed while in the hospital on 02/04/2019. 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 There is an overlying plantar open wound noted an abscess tracking back along the lateral aspect of the fifth metatarsal shaft. There is otherwise diffuse cellulitis and mild fasciitis without findings of polymyositis. The patient did have recently pneumonia secondary to COVID-19 I looked in the chart through epic and it does appear that the patient may need to have an additional x-ray just to ensure everything is cleared and that he has no airspace disease prior to putting him into the [name]. 03/05/2019; patient was readmitted to the clinic last week. He was hospitalized twice for a viral upper respiratory tract infection from 11/1 through 11/4 and then 11/5 through 11/12 ultimately this turned out to be Covid pneumonitis. Although he was discharged on oxygen he is not using it. He says he feels fine. He has no exercise limitation no cough no sputum. His O2 sat in our clinic today was 100% on room air. He did manage to have his MRI which showed septic arthritis at the fifth MTP joint and osteomyelitis involving the fifth metatarsal head  and proximal phalanx. He received Vanco and Zosyn in the hospital and then was discharged on 2 weeks of Augmentin. I do not see any relevant cultures. He was supposed to follow-up with infectious disease but I do not see that he has an appointment. 12/8; patient saw Dr. Novella Olive of infectious disease last week. He felt that he had had adequate  antibiotic therapy. He did not go to follow-up with Dr. Amalia Hailey of podiatry and I have again talked to him about the pros and cons of this. He does not want to consider a ray amputation of this time. He is aware of the risks of recurrence, migration etc. He started HBO today and tolerated this well. He can complete the Augmentin that I gave him last week. I have looked over the lab work that Dr. Chana Bode ordered his C-reactive protein was 3.3 and his sedimentation rate was 17. The C-reactive protein is never really been measurably that high in this patient 12/15; not much change in the wound today however he has undermining along the lateral part of the foot again more extensively than last week. He has some rims of epithelialization. We have been using silver alginate. He is undergoing hyperbarics but did not dive today 12/18; in for his obligatory first total contact cast change. Unfortunately there was pus coming from the undermining area around his fifth metatarsal head. This was cultured but will preclude reapplication of a cast. He is seen in conjunction with HBO 12/24; patient had staph lugdunensis in the wound in the undermining area laterally last time. We put him on doxycycline which should have covered this. The wound looks better today. I am going to give him another week of doxycycline before reattempting the total contact cast 12/31; the patient is completing antibiotics. Hemorrhagic debris in the distal part of the wound with some undermining distally. He also had hyper granulation. Extensive debridement with a #5 curette. The infected area that was on the lateral part of the fifth met head is closed over. I do not think he needs any more antibiotics. Patient was seen prior to HBO. Preparations for a total contact cast were made in the cast will be placed post hyperbarics 04/11/19; once again the patient arrives today without complaint. He had been in a cast all week noted that he had heavy  drainage this week. This resulted in large raised areas of macerated tissue around the wound 1/14; wound bed looks better slightly smaller. Hydrofera Blue has been changing himself. He had a heavy drainage last week which caused a lot of maceration around the wound so I took him out of a total contact cast he says the drainage is actually better this week He is seen today in conjunction with HBO 1/21; returns to clinic. He was up in [place] for a day or 2 attending a funeral. He comes back in with the wound larger and with a large area of exposed bone. He had osteomyelitis and septic arthritis of the fifth left metatarsal head while he was in hospital. He received IV antibiotics in the hospital for a prolonged period of time then 3 weeks of Augmentin. Subsequently I gave him 2 weeks of doxycycline for more superficial wound infection. When I saw this last week the wound was smaller the surface of the wound looks satisfactory. 1/28; patient missed hyperbarics today. Bone biopsy I did last time showed Enterococcus faecalis and Staphylococcus lugdunensis . He has a wide area of exposed bone. We are going to use  silver alginate as of today. I had another ethical discussion with the patient. 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 In this situation I think the likelihood of healing this is low. Therefore I have recommended a ray amputation and with the patient's agreement I have referred him to Dr. Doran Durand. The other issue is that his compliance with hyperbarics has been minimal because of his work schedule and given his underlying decision I am going to stop this today READMISSION 10/24/2019 MRI 09/29/2019 left foot IMPRESSION: 1. Apparent skin ulceration inferior and lateral to the 5th metatarsal base with underlying heterogeneous T2 signal and enhancement in the subcutaneous fat. Small peripherally enhancing fluid collections along the plantar and lateral  aspects of the 5th metatarsal base suspicious for abscesses. 2. Interval amputation through the mid 5th metatarsal with nonspecific low-level marrow edema and enhancement. Given the proximity to the adjacent soft tissue inflammatory changes, osteomyelitis cannot be excluded. 3. The additional bones appear unremarkable. MRI 09/29/2019 right foot IMPRESSION: 1. Soft tissue ulceration lateral to the 5th MTP joint. There is low-level T2 hyperintensity within the 4th and 5th metatarsal heads and adjacent proximal phalanges without abnormal T1 signal or cortical destruction. These findings are nonspecific and could be seen with early marrow edema, hyperemia or early osteomyelitis. No evidence of septic joint. 2. Mild tenosynovitis and synovial enhancement associated with the extensor digitorum tendons at the level of the midfoot. 3. Diffuse low-level muscular T2 hyperintensity and enhancement, most consistent with diabetic myopathy. LEFT FOOT BONE Methicillin resistant staphylococcus aureus Staphylococcus lugdunensis MIC MIC CIPROFLOXACIN >=8 RESISTANT Resistant <=0.5 SENSI... Sensitive CLINDAMYCIN <=0.25 SENS... Sensitive >=8 RESISTANT Resistant ERYTHROMYCIN >=8 RESISTANT Resistant >=8 RESISTANT Resistant GENTAMICIN <=0.5 SENSI... Sensitive <=0.5 SENSI... Sensitive Inducible Clindamycin NEGATIVE Sensitive NEGATIVE Sensitive OXACILLIN >=4 RESISTANT Resistant 2 SENSITIVE Sensitive RIFAMPIN <=0.5 SENSI... Sensitive <=0.5 SENSI... Sensitive TETRACYCLINE <=1 SENSITIVE Sensitive <=1 SENSITIVE Sensitive TRIMETH/SULFA <=10 SENSIT Sensitive <=10 SENSIT Sensitive ... Marland Kitchen.. VANCOMYCIN 1 SENSITIVE Sensitive <=0.5 SENSI... Sensitive Right foot bone . Component 3 wk ago Specimen Description BONE Special Requests RIGHT 4 METATARSAL SAMPLE B Gram Stain NO WBC SEEN NO ORGANISMS SEEN Culture RARE METHICILLIN RESISTANT STAPHYLOCOCCUS AUREUS NO ANAEROBES ISOLATED Performed at [facility],  [place] [address], [place], [place] [address] Report Status 10/08/2019 FINAL Organism ID, Bacteria METHICILLIN RESISTANT STAPHYLOCOCCUS AUREUS Resulting Agency [facility] Susceptibility Methicillin resistant staphylococcus aureus MIC CIPROFLOXACIN >=8 RESISTANT Resistant CLINDAMYCIN <=0.25 SENS... Sensitive ERYTHROMYCIN >=8 RESISTANT Resistant GENTAMICIN <=0.5 SENSI... Sensitive Inducible Clindamycin NEGATIVE Sensitive OXACILLIN >=4 RESISTANT Resistant RIFAMPIN <=0.5 SENSI... Sensitive TETRACYCLINE <=1 SENSITIVE Sensitive TRIMETH/SULFA <=10 SENSIT Sensitive ... VANCOMYCIN 1 SENSITIVE Sensitive This is a patient we had in clinic earlier this year with a wound over his left fifth metatarsal head. 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 Doran Durand on 05/16/2019. He seems to have developed problems with wounds on his bilateral feet in June 2021 although he may have had problems earlier than this. He was in an urgent care with a right foot ulcer on 09/26/2019 and given a course of doxycycline. This was apparently after having trouble getting into see orthopedics. He was seen by podiatry on 09/28/2019 noted to have bilateral lower extremity ulcers including the left lateral fifth metatarsal  base and the right subfifth met head. It was noted that had purulent drainage at that time. He required hospitalization from 6/20 through 7/2. This was because of worsening right foot wounds. He underwent bilateral operative incision and drainage and bone biopsies bilaterally. Culture results are listed above. He has been referred back to clinic by Dr. Jacqualyn Posey of podiatry. He is also followed by Dr. Megan Salon who saw him yesterday. He was  discharged from hospital on Zyvox Flagyl and Levaquin and yesterday changed to doxycycline Flagyl and Levaquin. His inflammatory markers on 6/26 showed a sedimentation rate of 129 and a C-reactive protein of 5. This is improved to 14 and 1.3 respectively. This would indicate improvement. ABIs in our clinic today were 1.23 on the right and 1.20 on the left 11/01/2019 on evaluation today patient appears to be doing fairly well in regard to the wounds on his feet at this point. Fortunately there is no signs of active infection at this time. No fevers, chills, nausea, vomiting, or diarrhea. He currently is seeing infectious disease and still under their care at this point. Subsequently he also has both wounds which she has not been using collagen on as he did not receive that in his packaging he did not call us and let us know that. Apparently that just was missed on the order. Nonetheless we will get that straightened out today. 8/9-Patient returns for bilateral foot wounds, using Prisma with hydrogel moistened dressings, and the wounds appear stable. Patient using surgical shoes, avoiding much pressure or weightbearing as much as possible 8/16; patient has bilateral foot wounds. 1 on the right lateral foot proximally the other is on the left mid lateral foot. Both required debridement of callus and thick skin around the wounds. We have been using silver collagen 8/27; patient has bilateral lateral foot wounds. The area on the left substantially surrounded by callus and dry skin. This was removed from the wound edge. The underlying wound is small. The area on the right measured somewhat smaller today. 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 Unfortunately I did not update his antibiotics during today's visit. 9/10 I reviewed Dr. Hale Bogus last notes he felt he had completed antibiotics his inflammatory markers were reasonably well  controlled. He has a small wound on the lateral left foot and a tiny area on the right which is just above closed. He is using Hydrofera Blue with border foam he has bilateral surgical shoes 9/24; 2 week f/u. doing well. right foot is closed. left foot still undermined. 10/14; right foot remains closed at the fifth met head. The area over the base of the left fifth metatarsal has a small open area but considerable undermining towards the plantar foot. Thick callus skin around this suggests an adequate pressure relief. We have talked about this. He says he is going to go back into his cam boot. I suggested a total contact cast he did not seem enamored with this suggestion 10/26; left foot base of the fifth metatarsal. Same condition as last time. He has skin over the area with an open wound however the skin is not adherent. He went to see Dr. Earleen Newport who did an x-ray and culture of his foot I have not reviewed the x-ray but the patient was not told anything. He is on doxycycline 11/11; since the patient was last here he was in the emergency room on 10/30 he was concerned about swelling in the left  foot. They did not do any cultures or x-rays. They changed his antibiotics to cephalexin. Previous culture showed group B strep. The cephalexin is appropriate as doxycycline has less than predictable coverage. Arrives in clinic today with swelling over this area under the wound. He also has a new wound on the right fifth metatarsal head 11/18; the patient has a difficult wound on the lateral aspect of the left fifth metatarsal head. The wound was almost ballotable last week I opened it slightly expecting to see purulence however there was just bleeding. I cultured this this was negative. X-ray unchanged. We are trying to get an MRI but I am not sure were going to be able to get this through his insurance. He also has an area on the right lateral fifth metatarsal head this looks healthier 12/3; the patient  finally got our MRI. Surprisingly this did not show osteomyelitis. I did show the soft tissue ulceration at the lateral plantar aspect of the fifth metatarsal base with a tiny residual 6 mm abscess overlying the superficial fascia I have tried to culture this area I have not been able to get this to grow anything. Nevertheless the protruding tissue looks aggravated. I suspect we should try to treat the underlying "abscess with broad-spectrum antibiotics. I am going to start him on Levaquin and Flagyl. He has much less edema in his legs and I am going to continue to wrap his legs and see him weekly 12/10. I started Levaquin and Flagyl on him last week. He just picked up the Flagyl apparently there was some delay. The worry is the wound on the left fifth metatarsal base which is substantial and worsening. His foot looks like he inverts at the ankle making this a weightbearing surface. Certainly no improvement in fact I think the measurements of this are somewhat worse. We have been using 12/17; he apparently just got the Levaquin yesterday this is 2 weeks after the fact. He has completed the Flagyl. The area over the left fifth metatarsal base still has protruding granulation tissue although it does not look quite as bad as it did some weeks ago. He has severe bilateral lymphedema although we have not been treating him for wounds on his legs this is definitely going to require compression. There was so much edema in the left I did not wish to put him in a total contact cast today. I am going to increase his compression from 3-4 layer. The area on the right lateral fifth met head actually look quite good and superficial. 12/23; patient arrived with callus on the right fifth met head and the substantial hyper granulated callused wound on the base of his fifth metatarsal. He says he is completing his Levaquin in 2 days but I do not think that adds up with what I gave him but I will have to double check  this. We are using Hydrofera Blue on both areas. My plan is to put the left leg in a cast the week after New Year's 04/06/2020; patient's wounds about the same. Right lateral fifth metatarsal head and left lateral foot over the base of the fifth metatarsal. There is undermining on the left lateral foot which I removed before application of total contact cast continuing with Hydrofera Blue new. Patient tells me he was seen by endocrinology today lab work was done [Dr. Kerr]. Also wondering whether he was referred to cardiology. I went over some lab work from previously does not have chronic renal failure certainly not  nephrotic range proteinuria he does have very poorly controlled diabetes but this is not his most updated lab work. Hemoglobin A1c has been over 11 1/10; the patient had a considerable amount of leakage towards mid part of his left foot with macerated skin however the wound surface looks better the area on the right lateral fifth met head is better as well. I am going to change the dressing on the left foot under the total contact cast to silver alginate, continue with Hydrofera Blue on the right. 1/20; patient was in the total contact cast for 10 days. Considerable amount of drainage although the skin around the wound does not look too bad on the left foot. The area on the right fifth metatarsal head is closed. Our nursing staff reports large amount of drainage out of the left lateral foot wound 1/25; continues with copious amounts of drainage described by our intake staff. PCR culture I did last week showed E. coli and Enterococcus faecalis and low quantities. Multiple resistance genes documented including extended spectrum beta lactamase, MRSA, MRSE, quinolone, tetracycline. The wound is not quite as good this week as it was 5 days ago but about the same size 2/3; continues with copious amounts of malodorous drainage per our intake nurse. The PCR culture I did 2 weeks ago showed E. coli  and low quantities of Enterococcus. There were multiple resistance genes detected. I put Neosporin on him last week although this does not seem to have helped. The wound is slightly deeper today. Offloading continues to be an issue here although with the amount of drainage she has a total contact cast is just not going to work 2/10; moderate amount of drainage. Patient reports he cannot get his stocking on over the dressing. I told him we have to do that the nurse gave him suggestions on how to make this work. The wound is on the bottom and lateral part of his left foot. Is cultured predominantly grew low amounts of Enterococcus, E. coli and anaerobes. There were multiple resistance genes detected including extended spectrum beta lactamase, quinolone, tetracycline. I could not think of an easy oral combination to address this so for now I am going to do topical antibiotics provided by [facility] I think the main agents here are vancomycin and an aminoglycoside. We have to be able to give him access to the wounds to get the topical antibiotic on 2/17; moderate amount of drainage this is unchanged. He has his Keystone topical antibiotic against the deep tissue culture organisms. He has been using this and changing the dressing daily. Silver alginate on the wound surface. 2/24; using Keystone antibiotic with silver alginate on the top. He had too much drainage for a total contact cast at one point although I think that is improving and I think in the next week or 2 it might be possible to replace a total contact cast I did not do this today. In general the wound surface looks healthy however he continues to have thick rims of skin and subcutaneous tissue around the wide area of the circumference which I debrided 06/04/2020 upon evaluation today patient appears to be doing well in regard to his wound. I do feel like he is showing signs of improvement. There is little bit of callus and dead tissue around the  edges of the wound as well as what appears to be a little bit of a sinus tract that is off to the side laterally I would perform debridement to clear that  away today. 3/17; left lateral foot. The wound looks about the same as I remember. Not much depth surface looks healthy. No evidence of infection 3/25; left lateral foot. Wound surface looks about the same. Separating epithelium from the circumference. There really is no evidence of infection here however not making progress by my view 3/29; left lateral foot. Surface of the wound again looks reasonably healthy still thick skin and subcutaneous tissue around the wound margins. There is no evidence of infection. One of the concerns being brought up by the nurses has again the amount of drainage vis--vis continued use of a total contact cast 4/5; left lateral foot at roughly the base of the fifth metatarsal. Nice healthy looking granulated tissue with rims of epithelialization. The overall wound measurements are not any better but the tissue looks healthy. The only concern is the amount of drainage although he has no surrounding maceration with what we have been doing recently to absorb fluid and protect his skin. He also has lymphedema. 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 He states that the kerlix/coban under the TCC rolled down and could not get it back up. He has been using an offloading scooter and has somehow hurt his right foot using this device. This happened last week. He states that the side of his right foot developed a blister and opened. The top of his foot also has a few small open wounds he thinks is due to his socks rubbing in his shoes. He has not been using any dressings to the  wound. He denies purulent drainage, fever/chills or erythema to the wounds. 4/22; patient presents for 1 week follow-up. He developed new wounds to the right foot that were evaluated at last clinic visit. He continues to have a total contact cast to the left leg and he reports no issues. He has been using silver collagen to the right foot wounds with no issues. He denies purulent drainage, fever/chills or erythema to the right foot wounds. He has no complaints today 4/25; patient presents for 1 week follow-up. He has a total contact cast of the left leg and reports no issues. He has been using silver alginate to the right foot wound. He denies purulent drainage, fever/chills or erythema to the right foot wounds. 5/2 patient presents for 1 week follow-up. T contact cast on the left. The wound which is on the base of the plantar foot at the base of the fifth metatarsal otal actually looks quite good and dimensions continue to gradually contract. HOWEVER the area on the right lateral fifth metatarsal head is much larger than what I remember from 2 weeks ago. Once more is he has significant levels of hypergranulation. Noteworthy that he had this same hyper granulated response on his wound on the left foot at one point in time. So much so that he I thought there was an underlying fluid collection. Based on this I think this just needs debridement. 5/9; the wound on the left actually continues to be gradually smaller with a healthy surface. Slight amount of drainage and maceration of the skin around but not too bad. However he has a large wound over the right fifth metatarsal head very much in the same configuration as  his left foot wound was initially. I used silver nitrate to address the hyper granulated tissue no mechanical debridement 5/16; area on the left foot did not look as healthy this week deeper thick surrounding macerated skin and subcutaneous tissue. oo The area on the right foot fifth met  head was about the same oo The area on the right ankle that we identified last week is completely broken down into an open wound presumably a stocking rubbing issue 5/23; patient has been using a total contact cast to the left side. He has been using silver alginate underneath. He has also been using silver alginate to the right foot wounds. He has no complaints today. He denies any signs of infection. 5/31; the left-sided wound looks some better measure smaller surface granulation looks better. We have been using silver alginate under the total contact cast oo The large area on his right fifth met head and right dorsal foot look about the same still using silver alginate 6/6; neither side is good as I was hoping although the surface area dimensions are better. A lot of maceration on his left and right foot around the wound edge. Area on the dorsal right foot looks better. He says he was traveling. I am not sure what does the amount of maceration around the plantar wounds may be drainage issues 6/13; in general the wound surfaces look quite good on both sides. Macerated skin and raised edges around the wound required debridement although in general especially on the left the surface area seems improved. oo The area on the right dorsal ankle is about the same I thought this would not be such a problem to close 6/20; not much change in either wound although the one on the right looks a little better. Both wounds have thick macerated edges to the skin requiring debridements. We have been using silver alginate. The area on the dorsal right ankle is still open I thought this would be closed. 6/28; patient comes in today with a marked deterioration in the right foot wound fifth met head. Wide area of exposed bone this is a drastic change from last time. The area on the left there we have been casting is stagnant. We have been using silver alginate in both wound areas. 7/5; bone culture I did for PCR  last time was positive for Pseudomonas, group B strep, Enterococcus and Staph aureus. There was no suggestion of methicillin resistance or ampicillin resistant genes. This was resistant to tetracycline however He comes into the clinic today with the area over his right plantar fifth metatarsal head which had been doing so well 2 weeks ago completely necrotic feeling bone. I do not know that this is going to be salvageable. The left foot wound is certainly no smaller but it has a better surface and is superficial. 7/8; patient called in this morning to say that his total contact cast was rubbing against his foot. He states he is doing fine overall. He denies signs of infection. 7/12; continued deterioration in the wound over the right fifth metatarsal head crumbling bone. This is not going to be salvageable. The patient agrees and wants to be referred to Dr. Doran Durand which we will attempt to arrange as soon as possible. I am going to continue him on antibiotics as long as that takes so I will renew those today. The area on the left foot which is the base of the fifth metatarsal continues to look somewhat better. Healthy looking tissue no depth no  debridement is necessary here. 7/20; the patient was kindly seen by Dr. Doran Durand of orthopedics on 10/19/2020. 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 Towards this end we have taken him out of the total contact cast on the left we will put him in a wrap with Hydrofera Blue. As I understand things surgery is planned for 7/21 7/27; patient had his surgery last Thursday. 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 Apparently everything went well we did not still disturb that today The area on the left foot actually looks quite good. He has been much less mobile which probably explains this he did not seem to do well in the total contact cast secondary to drainage and  maceration I think. 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 Fortunately there is no evidence of active infection at this time. No fevers, chills, nausea, vomiting, or diarrhea. Overall I think that he is actually doing extremely well. Nonetheless I do believe that he is staying off of this more following the surgery in his right foot that is the reason the left is doing so great. 8/16; left plantar foot wound. This looks smaller than the last time I saw this he is using Hydrofera Blue. The surgical wound on the right foot is being followed by Dr. Doran Durand we did not look at this today. He has surgical shoes on both feet 8/23; left plantar foot wound not as good this week. Surrounding macerated skin and subcutaneous tissue everything looks moist and wet. I do not think he is offloading this adequately. He is using a surgical shoe Apparently the right foot surgical wound is not open although I did not check his foot 8/31; left plantar foot lateral aspect. Much improved this week. He has no maceration. Some improvement in the surface area of the wound but most impressively the depth is come in we are using silver alginate. The patient is a Product/process development scientist. He is asked that we write him a letter so he can go back to work. I have also tried to see if we can write something that will allow him to limit the amount of time that he is on his foot at work. 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 Hopefully the latter can be avoided 9/6; I believe the patient missed an appointment last week. He arrives in today with a wound looking roughly the same certainly no better. Undermining laterally and also inferiorly. We used [employer]cuLight today in training with the patient's permission.. We are using silver alginate 9/21  wound is measuring bigger this week although this may have to do with the aggressive circumferential debridement last week in response to the blush fluorescence on the MolecuLight. Culture I did last week showed significant MSSA and E. coli. I put him on Augmentin but he has not started it yet. We are also going to send this for compounded antibiotics at [facility]. There is no evidence of systemic infection 9/29; silver alginate. His Keystone arrived. He is completing Augmentin in 2 days. Offloading in a cam boot. Moderate drainage per our intake staff 10/5; using silver alginate. He has been using his [place]. He has completed his Augmentin.  Per our intake nurse still a lot of drainage, far too much to consider a total contact cast. Wound measures about the same. He had the same undermining area that I defined last week from a roughly 11-3. I remove this today 10/12; using silver alginate he is using the [place]. He comes in for a nurse visit hence we are applying [employer] twice a week. Measuring slightly better today and less notable drainage. Extensive debridement of the wound edge last time 10/18; using topical Keystone and silver alginate and a soft cast. Wound measurements about the same. Drainage was through his soft cast. We are changing this twice a week Tuesdays and Friday 10/25; comes in with moderate drainage. Still using Keystone silver alginate and a soft cast. Wound dimensions completely the same.He has a lot of edema in the left leg he has lymphedema. Asking for [name] to consider wrapping him as he cannot get his stocking on over the soft cast 11/2; comes in with moderate to large drainage slightly smaller in terms of width we have been using [place]. His wound looks satisfactory but not much improvement 11/4; patient presents today for obligatory cast change. Has no issues or complaints today. He denies signs of infection. 11/9; patient traveled this weekend to [place], was on the cast  quite a bit. Staining of the cast with black material from his walking boot. Drainage was not quite as bad as we feared. Using silver alginate and Keystone 11/16; we do not have size for cast therefore we have been putting a soft cast on him since the change on Friday. Still a significant amount of drainage necessitating changing twice a week. We have been using the Keystone at cast changes either hard or soft as well as silver alginate Comes in the clinic with things actually looking fairly good improvement in width. He says his offloading is about the same 02/24/2021 upon evaluation today patient actually comes back in and is doing excellent in regard to his foot ulcer this is significantly smaller even compared to the last visit. The soft cast seems to have done extremely well for him which is great news. I do not see any signs of infection minimal debridement will be needed today. 11/30; left lateral foot much improved half a centimeter improvement in surface area. No evidence of infection. He seems to be doing better with the soft cast in the TCC therefore we will continue with this. He comes back in later in the week for a change with the nurses. This is due to drainage 12/6; no improvement in dimensions. Under illumination some debris on the surface we have been using silver alginate, soft cast. If there is anything optimistic here he seems to have have less drainage 12/13. Dimensions are improved both length and width and slightly in depth. Appears to be quite healthy today. Raised edges of this thick skin and callus around the edges however. He is in a soft cast were bringing him back once for a change on Friday. Drainage is better 12/20. Dimensions are improved. He still has raised edges of thick skin and callus around the edges. We are using a soft cast 12/28; comes in today with thick callus around the wound. Using silver under alginate under a soft cast. I do not think there is much  improvement in any measurement 2023 04/06/2021; patient was put in a total contact cast. Unfortunately not much change in surface area 1/10; not much different still thick callus and skin around the edge  in spite of the total contact cast. This was just debrided last week we have been using the [facility] compounded antibiotic and silver alginate under a total contact cast 1/18 the patient's wound on the left side is doing nicely. smaller HOWEVER he comes in today with a wound on the right foot laterally. blister most likely serosangquenous drainage 1/24; the patient continues to do well in terms of the plantar left foot which is continued to contract using silver alginate under the total contact cast HOWEVER the right lateral foot is bigger with denuded skin around the edges. I used pickups and a #15 scalpel to remove this this looks like the remanence of a large blister. Cannot rule out infection. Culture in this area I did last week showed Staphylococcus lugdunensis few colonies. I am going to try to address this with his [employer] antibiotic that is done so well on the left having linezolid and this should cover the staph 2/1; the patient's wound on his left foot which was the original plantar foot wound thick skin and eschar around the edges even in the total contact cast but the wound surface does not look too bad The real problem is on how his right lateral foot at roughly the base of the fifth metatarsal. The wound is completely necrotic more worrisome than that there is swelling around the edges of this. We have been using silver alginate on both wounds and Keystone on the right foot. Unfortunately I think he is going to require systemic antibiotics while we await cultures. He did not get the x-ray done that we ordered last week [lost the prescription 2/7; disappointingly in the area on the left foot which we are treating with a total contact cast is still not closed although it is much smaller.  He continues to have a lot of callus around the wound edge. -Right lateral foot culture I did last week was negative x-ray also negative for osteomyelitis. 2/15: TCC silver alginate on the left and silver alginate on the right lateral. No real improvement in either area 05/26/2021: T oday, the wounds are roughly the same size as at his previous visit, post-debridement. He continues to endorse fairly substantial drainage, particularly on the right. He has been in a total contact cast on the left. There is still some callus surrounding this lesion. On the right, the periwound skin is quite macerated, along with surrounding callus. The center of the right-sided wound also has some dark, densely adherent material, which is very difficult to remove. 06/02/2021: Today, both wounds are slightly smaller. He has been using zinc oxide ointment around the right ulcer and the degree of maceration has improved markedly. There continues to be an area of nonviable tissue in the center of the right sided ulcer. The left-sided wound, which has been in the total contact cast. Appears clean and the degree of callus around it is less than previously. Patient History Information obtained from Patient. Family History No family history of Cancer, Diabetes, Hereditary Spherocytosis, Hypertension, Kidney Disease, Lung Disease, Seizures, Stroke, Thyroid Problems, Tuberculosis. Social History Never smoker, Marital Status - Single, Alcohol Use - Rarely, Drug Use - No History, Caffeine Use - Never. Medical History Eyes Denies history of Cataracts, Glaucoma, Optic Neuritis Ear/Nose/Mouth/Throat Denies history of Chronic sinus problems/congestion, Middle ear problems Hematologic/Lymphatic Denies history of Anemia, Hemophilia, Human Immunodeficiency Virus, Lymphedema, Sickle Cell Disease Respiratory Denies history of Aspiration, Asthma, Chronic Obstructive Pulmonary Disease (COPD), Pneumothorax, Sleep Apnea,  Tuberculosis Cardiovascular Denies history of Angina,  Arrhythmia, Congestive Heart Failure, Coronary Artery Disease, Deep Vein Thrombosis, Hypertension, Hypotension, Myocardial Infarction, Peripheral Arterial Disease, Peripheral Venous Disease, Phlebitis, Vasculitis Gastrointestinal Denies history of Cirrhosis , Colitis, Crohnoos, Hepatitis A, Hepatitis B, Hepatitis C Endocrine Patient has history of Type II Diabetes Denies history of Type I Diabetes Immunological Denies history of Lupus Erythematosus, Raynaudoos, Scleroderma Integumentary (Skin) Denies history of History of Burn Musculoskeletal Denies history of Gout, Rheumatoid Arthritis, Osteoarthritis, Osteomyelitis Neurologic Denies history of Dementia, Neuropathy, Quadriplegia, Paraplegia, Seizure Disorder Oncologic Denies history of Received Chemotherapy, Received Radiation Psychiatric Denies history of Anorexia/bulimia, Confinement Anxiety Hospitalization/Surgery History - 11/1-11/06/2018- sepsis foot infection. - 11/4-11/5 02 sats low respiratory distress. Objective Constitutional No acute distress. Vitals Time Taken: 7:47 AM, Height: 77 in, Weight: 280 lbs, BMI: 33.2, Temperature: 98.4 F, Pulse: 95 bpm, Respiratory Rate: 18 breaths/min, Blood Pressure: 128/82 mmHg, Capillary Blood Glucose: 100 mg/dl. General Notes: glucose per pt report Respiratory Normal work of breathing on room air. General Notes: 06/02/2021: Wound evaluationoothe degree of maceration around the right sided ulcer has improved markedly with the use of Desitin ointment on the periwound tissue. There is still heavy callus present. The base, unfortunately, still appears suboptimal with a central area of extremely pale fat. This wound was debrided to remove callus and some of the necrotic tissue in the center was also removed, but this is extremely difficult to debride. On the left, the total contact cast appears to be doing its job and the wound is  smaller. The callus is also less than on previous visits and no debridement was required on this side. Integumentary (Hair, Skin) Wound #3 status is Open. Original cause of wound was Trauma. The date acquired was: 10/02/2019. The wound has been in treatment 83 weeks. The wound is located on the Left,Lateral Foot. The wound measures 0.7cm length x 0.6cm width x 0.2cm depth; 0.33cm^2 area and 0.066cm^3 volume. There is Fat Layer (Subcutaneous Tissue) exposed. There is no tunneling or undermining noted. There is a medium amount of serosanguineous drainage noted. The wound margin is thickened. There is large (67-100%) red granulation within the wound bed. There is no necrotic tissue within the wound bed. Wound #8 status is Open. Original cause of wound was Blister. The date acquired was: 04/18/2021. The wound has been in treatment 6 weeks. The wound is located on the Right,Lateral Foot. The wound measures 3.2cm length x 2.8cm width x 0.1cm depth; 7.037cm^2 area and 0.704cm^3 volume. There is Fat Layer (Subcutaneous Tissue) exposed. There is no tunneling or undermining noted. There is a medium amount of serosanguineous drainage noted. The wound margin is well defined and not attached to the wound base. There is medium (34-66%) red, friable granulation within the wound bed. There is a medium (34-66%) amount of necrotic tissue within the wound bed including Eschar and Adherent Slough. 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 There was a Excisional Skin/Subcutaneous Tissue Debridement with a total area of 8.96 sq  cm performed by [name], MD. With the following instrument(s): Blade, and Forceps to remove Non-Viable tissue/material. Material removed includes Fat, Callus, and Slough. No specimens were taken. A time out was conducted at  08:10, prior to the start of the procedure. A Moderate amount of bleeding was controlled with Silver Nitrate. The procedure was tolerated well with a pain level of 0 throughout and a pain level of 0 following the procedure. Post Debridement Measurements: 3.2cm length x 2.8cm width x 0.1cm depth; 0.704cm^3 volume. Character of Wound/Ulcer Post Debridement is improved. Severity of Tissue Post Debridement is: Fat layer exposed. Post procedure Diagnosis Wound #8: Same as Pre-Procedure Wound #3 Pre-procedure diagnosis of Wound #3 is a Diabetic Wound/Ulcer of the Lower Extremity located on the Left,Lateral Foot . There was a T Contact Cast otal Procedure by [name], MD. Post procedure Diagnosis Wound #3: Same as Pre-Procedure Plan Follow-up Appointments: Return Appointment in 1 week. - Dr. Francee Nodal Shower/ Hygiene: May shower with protection but do not get wound dressing(s) wet. - Use Cast Protector Bag Edema Control - Lymphedema / SCD / Other: Elevate legs to the level of the heart or above for 30 minutes daily and/or when sitting, a frequency of: - throughout the day Avoid standing for long periods of time. Exercise regularly Moisturize legs daily. - right leg every night before bed. Compression stocking or Garment 20-30 mm/Hg pressure to: - Apply to right leg in the morning and remove at night. Off-Loading: T Contact Cast to Left Lower Extremity otal Open toe surgical shoe to: - right foot Other: - minimal weight bearing left foot Additional Orders / Instructions: Follow Nutritious Diet The following medication(s) was prescribed: Santyl topical 250 unit/gram ointment apply to wound as instructed starting 06/02/2021 WOUND #3: - Foot Wound  Laterality: Left, Lateral Cleanser: Soap and Water 1 x Per Week/7 Days Discharge Instructions: May shower and wash wound with dial antibacterial soap and water prior to dressing change. Topical: Keystone antibiotic compound 1 x Per Week/7 Days Discharge Instructions: Apply to wound bed Prim Dressing: Hydrofera Blue Classic Foam, 2x2 in 1 x Per Week/7 Days ary Discharge Instructions: Moisten with saline prior to applying to wound bed Secondary Dressing: Woven Gauze Sponge, Non-Sterile 4x4 in 1 x Per Week/7 Days Discharge Instructions: Apply over primary dressing as directed. Secondary Dressing: Optifoam Non-Adhesive Dressing, 4x4 in 1 x Per Week/7 Days Discharge Instructions: Apply over primary dressing as directed. Com pression Wrap: Kerlix Roll 4.5x3.1 (in/yd) 1 x Per Week/7 Days Discharge Instructions: Apply Kerlix and Coban compression as directed. Com pression Wrap: Coban Self-Adherent Wrap 4x5 (in/yd) 1 x Per Week/7 Days Discharge Instructions: Apply over Kerlix as directed. WOUND #8: - Foot Wound Laterality: Right, Lateral Cleanser: Soap and Water Every Other Day/15 Days Discharge Instructions: May shower and wash wound with dial antibacterial soap and water prior to dressing change. Cleanser: Byram Ancillary Kit - 15 Day Supply (DME) (Generic) Every Other Day/15 Days Discharge Instructions: Use supplies as instructed; Kit contains: (15) Saline Bullets; (15) 3x3 Gauze; 15 pr Gloves Peri-Wound Care: Zinc Oxide Ointment 30g tube Every Other Day/15 Days Discharge Instructions: Apply Zinc Oxide to periwound with each dressing change Peri-Wound Care: Sween Lotion (Moisturizing lotion) Every Other Day/15 Days Discharge Instructions: Apply moisturizing lotion as directed Prim Dressing: Hydrofera Blue Classic Foam, 4x4 in (DME) (Generic) Every Other Day/15 Days ary Discharge Instructions: Moisten with saline prior to applying to wound bed Prim Dressing: Santyl Ointment Every Other Day/15  Days ary Discharge Instructions: Apply nickel thick amount to wound bed, cover with Hydrofera Secondary Dressing: Woven Gauze Sponge, Non-Sterile 4x4 in (DME) (Generic) Every Other Day/15 Days Discharge Instructions: Apply over primary dressing as directed. Secondary Dressing: Optifoam Non-Adhesive  Dressing, 4x4 in (DME) (Generic) Every Other Day/15 Days Discharge Instructions: Apply over primary dressing as directed. Secured With: Coban Self-Adherent Wrap 4x5 (in/yd) (DME) (Generic) Every Other Day/15 Days Discharge Instructions: Secure with Coban as directed. Secured With: [employer], 4.5x3.1 (in/yd) (DME) (Generic) Every Other Day/15 Days Discharge Instructions: Secure with Kerlix as directed. Secured With: 86M Medipore H Soft Cloth Surgical T ape, 4 x 10 (in/yd) (DME) (Generic) Every Other Day/15 Days Discharge Instructions: Secure with tape as directed. 06/02/2021: While both wounds appear smaller today, the right foot ulcer continues to have necrotic material in the center. Callus and nonviable tissue were debrided from this lesion. The periwound maceration has improved with the use of zinc oxide ointment. On the left, we are getting good results from the total contact cast and no debridement was required at this time. In order to try and facilitate better removal of the nonviable tissue on the right, I am going to change his dressing to Santyl under Hydrofera Blue. We will continue the [facility] and total contact cast on the left. I will see him in 1 week. Last week we made a referral to podiatry to address his toenails, but he has not yet received this appointment. We will continue to follow-up. Electronic Signature(s) Signed: 06/02/2021 8:35:31 AM By: [name] MD FACS Entered By: [name] on 06/02/2021 08:35:31 -------------------------------------------------------------------------------- HxROS Details Patient Name: Date of Service: [name], [name]  06/02/2021 7:45 A M Medical Record Number: [id number] Patient Account Number: [id number] Date of Birth/Sex: Treating RN: 10/25/1985 (36 y.o. [name] Primary Care Provider: [name] Other Clinician: Referring Provider: Treating Provider/Extender: [name] in Treatment: 38 Information Obtained From Patient Eyes Medical History: Negative for: Cataracts; Glaucoma; Optic Neuritis Ear/Nose/Mouth/Throat Medical History: Negative for: Chronic sinus problems/congestion; Middle ear problems Hematologic/Lymphatic Medical History: Negative for: Anemia; Hemophilia; Human Immunodeficiency Virus; Lymphedema; Sickle Cell Disease Respiratory Medical History: Negative for: Aspiration; Asthma; Chronic Obstructive Pulmonary Disease (COPD); Pneumothorax; Sleep Apnea; Tuberculosis Cardiovascular Medical History: Negative for: Angina; Arrhythmia; Congestive Heart Failure; Coronary Artery Disease; Deep Vein Thrombosis; Hypertension; Hypotension; Myocardial Infarction; Peripheral Arterial Disease; Peripheral Venous Disease; Phlebitis; Vasculitis Gastrointestinal Medical History: Negative for: Cirrhosis ; Colitis; Crohns; Hepatitis A; Hepatitis B; Hepatitis C Endocrine Medical History: Positive for: Type II Diabetes Negative for: Type I Diabetes Time with diabetes: 8 Treated with: Insulin Blood sugar tested every day: No Immunological Medical History: Negative for: Lupus Erythematosus; Raynauds; Scleroderma Integumentary (Skin) Medical History: Negative for: History of Burn Musculoskeletal Medical History: Negative for: Gout; Rheumatoid Arthritis; Osteoarthritis; Osteomyelitis Neurologic Medical History: Negative for: Dementia; Neuropathy; Quadriplegia; Paraplegia; Seizure Disorder Oncologic Medical History: Negative for: Received Chemotherapy; Received Radiation Psychiatric Medical History: Negative for: Anorexia/bulimia; Confinement  Anxiety Immunizations Pneumococcal Vaccine: Received Pneumococcal Vaccination: No Implantable Devices None Hospitalization / Surgery History Type of Hospitalization/Surgery 11/1-11/06/2018- sepsis foot infection 11/4-11/5 02 sats low respiratory distress Family and Social History Cancer: No; Diabetes: No; Hereditary Spherocytosis: No; Hypertension: No; Kidney Disease: No; Lung Disease: No; Seizures: No; Stroke: No; Thyroid Problems: No; Tuberculosis: No; Never smoker; Marital Status - Single; Alcohol Use: Rarely; Drug Use: No History; Caffeine Use: Never; Financial Concerns: No; Food, Clothing or Shelter Needs: No; Support System Lacking: No; Transportation Concerns: No Physician Affirmation I have reviewed and agree with the above information. Electronic Signature(s) Signed: 06/02/2021 8:36:29 AM By: [name] MD FACS Signed: 06/02/2021 5:58:06 PM By: [name] RN, BSN Entered By: [name] on 06/02/2021 08:25:55 -------------------------------------------------------------------------------- Total Contact Cast  Details Patient Name: Date of Service: [name] D 06/02/2021 7:45 A M Medical Record Number: [id number] Patient Account Number: [id number] Date of Birth/Sex: Treating RN: April 04, 1986 (36 y.o. [name] Primary Care Provider: [name] Other Clinician: Referring Provider: Treating Provider/Extender: [name] in Treatment: 63 T Contact Cast Applied for Wound Assessment: otal Wound #3 Left,Lateral Foot Performed By: Physician [name], MD Post Procedure Diagnosis Same as Pre-procedure Electronic Signature(s) Signed: 06/02/2021 8:36:29 AM By: [name] MD FACS Signed: 06/02/2021 5:58:06 PM By: [name] RN, BSN Entered By: [name] on 06/02/2021 08:14:41 -------------------------------------------------------------------------------- SuperBill Details Patient Name: Date of Service: [name], [name] 06/02/2021 Medical Record Number: [id number] Patient Account Number: [id number] Date of Birth/Sex: Treating RN: 07-17-1985 (37 y.o. 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 Modifier: ity Quantity: 1 CPT4 Code: 14431540 Description: 08676 - APPLY TOTAL CONTACT LEG CAST ICD-10 Diagnosis Description L97.518 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 Modifier: ity Quantity: 1 Physician Procedures : CPT4 Code Description Modifier [id number] 11042 - WC PHYS SUBQ TISS 20 SQ CM ICD-10 Diagnosis Description L97.518 Non-pressure chronic ulcer of other part of right foot with other specified severity Quantity: 1 : [id number] 09983 - WC PHYS APPLY TOTAL CONTACT CAST ICD-10 Diagnosis Description L97.518 Non-pressure chronic ulcer of other part of right foot with other specified severity Quantity: 1 Electronic Signature(s) Signed: 06/02/2021 8:37:24 AM By: [name] MD FACS Previous Signature: 06/02/2021 8:35:49 AM Version By: [name] MD FACS Entered By: [name] on 06/02/2021 08:37:24

## 2021-06-02 NOTE — Progress Notes (Signed)
[name], Max ([id number]) Visit Report for 06/02/2021 Arrival Information Details Patient Name: Date of Service: [name] 06/02/2021 7:45 A M Medical Record Number: [id number] Patient Account Number: [id number] Date of Birth/Sex: Treating RN: 1986-03-29 (36 y.o. [name]: [name] Other Clinician: Referring [name]: Treating [name]/Extender: [name] in Treatment: 3 Visit Information History Since Last Visit Added or deleted any medications: No Patient Arrived: Ambulatory Any new allergies or adverse reactions: No Arrival Time: 07:47 Had a fall or experienced change in No Accompanied By: alone activities of daily living that may affect Transfer Assistance: None risk of falls: Patient Identification Verified: Yes Signs or symptoms of abuse/neglect since last visito No Secondary Verification Process Completed: Yes Hospitalized since last visit: No Patient Requires Transmission-Based Precautions: No Implantable device outside of the clinic excluding No Patient Has Alerts: No cellular tissue based products placed in the center since last visit: Has Dressing in Place as Prescribed: Yes Has Footwear/Offloading in Place as Prescribed: Yes Left: T Contact Cast otal Pain Present Now: No Electronic Signature(s) Signed: 06/02/2021 5:58:06 PM By: Max Hurst RN, BSN Entered By: [name] on 06/02/2021 07:48:07 -------------------------------------------------------------------------------- Encounter Discharge Information Details Patient Name: Date of Service: [name] 06/02/2021 7:45 A M Medical Record Number: [id number] Patient Account Number: [id number] Date of Birth/Sex: Treating RN: 1985-08-15 (36 y.o. [name] Primary Care Danielys Madry: [name] Other Clinician: Referring Chika Cichowski: Treating Raguel Kosloski/Extender: [name] in Treatment: 50 Encounter  Discharge Information Items Post Procedure Vitals Discharge Condition: Stable Temperature (F): 98.4 Ambulatory Status: Ambulatory Pulse (bpm): 95 Discharge Destination: Home Respiratory Rate (breaths/min): 18 Transportation: Private Auto Blood Pressure (mmHg): 128/82 Accompanied By: alone Schedule Follow-up Appointment: Yes Clinical Summary of Care: Patient Declined Electronic Signature(s) Signed: 06/02/2021 5:58:06 PM By: Max Hurst RN, BSN Entered By: [name] on 06/02/2021 12:00:02 -------------------------------------------------------------------------------- Lower Extremity Assessment Details Patient Name: Date of Service: [name] 06/02/2021 7:45 A M Medical Record Number: [id number] Patient Account Number: [id number] Date of Birth/Sex: Treating RN: [birth year] (36 y.o. [name] Primary Care Rennee Coyne: [name] Other Clinician: Referring [name]: Treating [name]/Extender: [name] in Treatment: 83 Edema Assessment Assessed: [name]: No] [name]: No] Edema: [Left: Yes] [Right: Yes] Calf Left: Right: Point of Measurement: 48 cm From Medial Instep 47.7 cm 48.1 cm Ankle Left: Right: Point of Measurement: 11 cm From Medial Instep 28.9 cm 27.6 cm Vascular Assessment Pulses: Dorsalis Pedis Palpable: [Left:Yes] [Right:Yes] Electronic Signature(s) Signed: 06/02/2021 5:58:06 PM By: Max Hurst RN, BSN Entered By: [name] on 06/02/2021 07:57:35 -------------------------------------------------------------------------------- Multi Wound Chart Details Patient Name: Date of Service: [name] 06/02/2021 7:45 A M Medical Record Number: [id number] Patient Account Number: [id number] Date of Birth/Sex: Treating RN: 10-Oct-1985 (36 y.o. [name] Primary Care Remonia Otte: [name] Other Clinician: Referring [name]: Treating [name]/Extender: [name] in  Treatment: 61 Vital Signs Height(in): 77 Capillary Blood Glucose(mg/dl): 100 Weight(lbs): 280 Pulse(bpm): 95 Body Mass Index(BMI): 33.2 Blood Pressure(mmHg): 128/82 Temperature(F): 98.4 Respiratory Rate(breaths/min): 18 Photos: [3:Left, Lateral Foot] [8:Right, Lateral Foot] [N/A:N/A N/A] Wound Location: [3:Trauma] [8:Blister] [N/A:N/A] Wounding Event: [3:Diabetic Wound/Ulcer of the Lower] [8:Diabetic Wound/Ulcer of the Lower] [N/A:N/A] Primary Etiology: [3:Extremity Type II Diabetes] [8:Extremity Type II Diabetes] [N/A:N/A] Comorbid History: [3:10/02/2019] [8:04/18/2021] [N/A:N/A] Date Acquired: [3:83] [8:6] [N/A:N/A] Weeks of Treatment: [3:Open] [8:Open] [N/A:N/A] Wound Status: [3:No] [8:No] [N/A:N/A] Wound Recurrence: [3:0.7x0.6x0.2] [8:3.2x2.8x0.1] [N/A:N/A] Measurements L x W  x [name] [3:0.33] [8:7.037] [N/A:N/A] A (cm) : rea [3:0.066] [1:6.109] [N/A:N/A] Volume (cm) : [3:80.00%] [8:-1179.50%] [N/A:N/A] % Reduction in A [3:rea: 60.00%] [8:-540.00%] [N/A:N/A] % Reduction in Volume: [3:Grade 2] [8:Grade 2] [N/A:N/A] Classification: [3:Medium] [8:Medium] [N/A:N/A] Exudate A mount: [3:Serosanguineous] [8:Serosanguineous] [N/A:N/A] Exudate Type: [3:red, brown] [8:red, brown] [N/A:N/A] Exudate Color: [3:Thickened] [8:Well defined, not attached] [N/A:N/A] Wound Margin: [3:Large (67-100%)] [8:Medium (34-66%)] [N/A:N/A] Granulation A mount: [3:Red] [8:Red, Friable] [N/A:N/A] Granulation Quality: [3:None Present (0%)] [8:Medium (34-66%)] [N/A:N/A] Necrotic A mount: [3:N/A] [8:Eschar, Adherent Slough] [N/A:N/A] Necrotic Tissue: [3:Fat Layer (Subcutaneous Tissue): Yes Fat Layer (Subcutaneous Tissue): Yes N/A] Exposed Structures: [3:Fascia: No Tendon: No Muscle: No Joint: No Bone: No Small (1-33%)] [8:Fascia: No Tendon: No Muscle: No Joint: No Bone: No None] [N/A:N/A] Epithelialization: [3:N/A] [8:Debridement - Excisional] [N/A:N/A] Debridement: Pre-procedure Verification/Time Out N/A  [8:08:10] [N/A:N/A] Taken: [3:N/A] [8:Fat, Callus, Slough] [N/A:N/A] Tissue Debrided: [3:N/A] [8:Skin/Subcutaneous Tissue] [N/A:N/A] Level: [3:N/A] [8:8.96] [N/A:N/A] Debridement A (sq cm): [3:rea N/A] [8:Blade, Forceps] [N/A:N/A] Instrument: [3:N/A] [8:Moderate] [N/A:N/A] Bleeding: [3:N/A] [8:Silver Nitrate] [N/A:N/A] Hemostasis A chieved: [3:N/A] [8:0] [N/A:N/A] Procedural Pain: [3:N/A] [8:0] [N/A:N/A] Post Procedural Pain: [3:N/A] [8:Procedure was tolerated well] [N/A:N/A] Debridement Treatment Response: [3:N/A] [8:3.2x2.8x0.1] [N/A:N/A] Post Debridement Measurements L x W x [name] [3:N/A] [6:0.454] [N/A:N/A] Post Debridement Volume: (cm) [3:T Contact Cast otal] [8:Debridement] [N/A:N/A] Treatment Notes Electronic Signature(s) Signed: 06/02/2021 8:23:12 AM By: [name] MD FACS Signed: 06/02/2021 5:58:06 PM By: Max Hurst RN, BSN Entered By: [name] on 06/02/2021 08:23:11 -------------------------------------------------------------------------------- Multi-Disciplinary Care Plan Details Patient Name: Date of Service: [name] 06/02/2021 7:45 A M Medical Record Number: [id number] Patient Account Number: [id number] Date of Birth/Sex: Treating RN: Feb 24, 1986 (36 y.o. [name] Primary Care Kielyn Kardell: [name] Other Clinician: Referring Niesha Bame: Treating Maeli Spacek/Extender: [name] in Treatment: 89 Multidisciplinary Care Plan reviewed with physician Active Inactive Nutrition Nursing Diagnoses: Imbalanced nutrition Potential for alteratiion in Nutrition/Potential for imbalanced nutrition Goals: Patient/caregiver agrees to and verbalizes understanding of need to use nutritional supplements and/or vitamins as prescribed Date Initiated: 10/24/2019 Date Inactivated: 04/06/2020 Target Resolution Date: 04/03/2020 Goal Status: Met Patient/caregiver will maintain therapeutic glucose control Date Initiated:  10/24/2019 Target Resolution Date: 06/11/2021 Goal Status: Active Interventions: Assess HgA1c results as ordered upon admission and as needed Assess patient nutrition upon admission and as needed per policy Provide education on elevated blood sugars and impact on wound healing Provide education on nutrition Treatment Activities: Education provided on Nutrition : 11/24/2020 Notes: 11/17/20: Glucose control ongoing issue, target date extended. 01/26/21: Glucose management continues. Wound/Skin Impairment Nursing Diagnoses: Impaired tissue integrity Knowledge deficit related to ulceration/compromised skin integrity Goals: Patient/caregiver will verbalize understanding of skin care regimen Date Initiated: 10/24/2019 Target Resolution Date: 06/11/2021 Goal Status: Active Ulcer/skin breakdown will have a volume reduction of 30% by week 4 Date Initiated: 10/24/2019 Date Inactivated: 01/16/2020 Target Resolution Date: 01/10/2020 Unmet Reason: no change in Goal Status: Unmet measurements. Interventions: Assess patient/caregiver ability to obtain necessary supplies Assess patient/caregiver ability to perform ulcer/skin care regimen upon admission and as needed Assess ulceration(s) every visit Provide education on ulcer and skin care Notes: 11/17/20: Wound care regimen continues Electronic Signature(s) Signed: 06/02/2021 5:58:06 PM By: Max Hurst RN, BSN Entered By: [name] on 06/02/2021 08:21:23 -------------------------------------------------------------------------------- Pain Assessment Details Patient Name: Date of Service: [name] 06/02/2021 7:45 A M Medical Record Number: [id number] Patient Account Number: [id number] Date of Birth/Sex: Treating RN: 1985-04-19 (36 y.o. [name] Primary Care Aniketh Huberty: [name] Other  Clinician: Referring Provider: Treating Provider/Extender: [name] in Treatment: 83 Active  Problems Location of Pain Severity and Description of Pain Patient Has Paino No Site Locations Pain Management and Medication Current Pain Management: Electronic Signature(s) Signed: 06/02/2021 5:58:06 PM By: Max Hurst RN, BSN Entered By: [name] on 06/02/2021 07:49:34 -------------------------------------------------------------------------------- Patient/Caregiver Education Details Patient Name: Date of Service: [name] 3/1/2023andnbsp7:45 A M Medical Record Number: [id number] Patient Account Number: [id number] Date of Birth/Gender: Treating RN: May 20, 1985 (36 y.o. [name] Primary Care Physician: [name] Other Clinician: Referring Physician: Treating Physician/Extender: [name] in Treatment: 28 Education Assessment Education Provided To: Patient Education Topics Provided Wound/Skin Impairment: Methods: Explain/Verbal Responses: State content correctly [employer]) Signed: 06/02/2021 5:58:06 PM By: Max Hurst RN, BSN Entered By: [name] on 06/02/2021 11:19:41 -------------------------------------------------------------------------------- Wound Assessment Details Patient Name: Date of Service: [name] 06/02/2021 7:45 A M Medical Record Number: [id number] Patient Account Number: [id number] Date of Birth/Sex: Treating RN: [birth year] (36 y.o. [name] Primary Care Provider: Seward [name] Other Clinician: Referring Provider: Treating Provider/Extender: [name] in Treatment: 92 Wound Status Wound Number: 3 Primary Etiology: Diabetic Wound/Ulcer of the Lower Extremity Wound Location: Left, Lateral Foot Wound Status: Open Wounding Event: Trauma Comorbid History: Type II Diabetes Date Acquired: 10/02/2019 Weeks Of Treatment: 83 Clustered Wound: No Photos Wound Measurements Length: (cm) 0.7 Width: (cm) 0.6 Depth: (cm) 0.2 Area:  (cm) 0.33 Volume: (cm) 0.066 % Reduction in Area: 80% % Reduction in Volume: 60% Epithelialization: Small (1-33%) Tunneling: No Undermining: No Wound Description Classification: Grade 2 Wound Margin: Thickened Exudate Amount: Medium Exudate Type: Serosanguineous Exudate Color: red, brown Wound Bed Granulation Amount: Large (67-100%) Exposed Structure Granulation Quality: Red Fascia Exposed: No Necrotic Amount: None Present (0%) Fat Layer (Subcutaneous Tissue) Exposed: Yes Tendon Exposed: No Muscle Exposed: No Joint Exposed: No Bone Exposed: No Treatment Notes Wound #3 (Foot) Wound Laterality: Left, Lateral Cleanser Soap and Water Discharge Instruction: May shower and wash wound with dial antibacterial soap and water prior to dressing change. Peri-Wound Care Topical Keystone antibiotic compound Discharge Instruction: Apply to wound bed Primary Dressing Hydrofera Blue Classic Foam, 2x2 in Discharge Instruction: Moisten with saline prior to applying to wound bed Secondary Dressing Woven Gauze Sponge, Non-Sterile 4x4 in Discharge Instruction: Apply over primary dressing as directed. Optifoam Non-Adhesive Dressing, 4x4 in Discharge Instruction: Apply over primary dressing as directed. Secured With Compression Wrap Kerlix Roll 4.5x3.1 (in/yd) Discharge Instruction: Apply Kerlix and Coban compression as directed. Coban Self-Adherent Wrap 4x5 (in/yd) Discharge Instruction: Apply over Kerlix as directed. Compression Stockings Add-Ons Electronic Signature(s) Signed: 06/02/2021 4:14:19 PM By: Dellie Catholic RN Signed: 06/02/2021 5:58:06 PM By: Max Hurst RN, BSN Entered By: Dellie Catholic on 06/02/2021 08:00:58 -------------------------------------------------------------------------------- Wound Assessment Details Patient Name: Date of Service: [name] 06/02/2021 7:45 A M Medical Record Number: [id number] Patient Account Number: [id number] Date of  Birth/Sex: Treating RN: [birth year] (36 y.o. [name] Primary Care Provider: Seward [name] Other Clinician: Referring Provider: Treating Provider/Extender: [name] in Treatment: 11 Wound Status Wound Number: 8 Primary Etiology: Diabetic Wound/Ulcer of the Lower Extremity Wound Location: Right, Lateral Foot Wound Status: Open Wounding Event: Blister Comorbid History: Type II Diabetes Date Acquired: 04/18/2021 Weeks Of Treatment: 6 Clustered Wound: No Photos Wound Measurements Length: (cm) 3.2 Width: (cm) 2.8 Depth: (cm) 0.1 Area: (cm) 7.037 Volume: (cm) 0.704 % Reduction in Area: -1179.5% % Reduction  in Volume: -540% Epithelialization: None Tunneling: No Undermining: No Wound Description Classification: Grade 2 Wound Margin: Well defined, not attached Exudate Amount: Medium Exudate Type: Serosanguineous Exudate Color: red, brown Foul Odor After Cleansing: No Slough/Fibrino Yes Wound Bed Granulation Amount: Medium (34-66%) Exposed Structure Granulation Quality: Red, Friable Fascia Exposed: No Necrotic Amount: Medium (34-66%) Fat Layer (Subcutaneous Tissue) Exposed: Yes Necrotic Quality: Eschar, Adherent Slough Tendon Exposed: No Muscle Exposed: No Joint Exposed: No Bone Exposed: No Treatment Notes Wound #8 (Foot) Wound Laterality: Right, Lateral Cleanser Soap and Water Discharge Instruction: May shower and wash wound with dial antibacterial soap and water prior to dressing change. [employer]ram Ancillary Kit - 15 Day Supply Discharge Instruction: Use supplies as instructed; Kit contains: (15) Saline Bullets; (15) 3x3 Gauze; 15 pr Gloves Peri-Wound Care Zinc Oxide Ointment 30g tube Discharge Instruction: Apply Zinc Oxide to periwound with each dressing change Sween Lotion (Moisturizing lotion) Discharge Instruction: Apply moisturizing lotion as directed Topical Primary Dressing Hydrofera Blue Classic Foam, 4x4 in Discharge  Instruction: Moisten with saline prior to applying to wound bed Santyl Ointment Discharge Instruction: Apply nickel thick amount to wound bed, cover with Hydrofera Secondary Dressing Woven Gauze Sponge, Non-Sterile 4x4 in Discharge Instruction: Apply over primary dressing as directed. Optifoam Non-Adhesive Dressing, 4x4 in Discharge Instruction: Apply over primary dressing as directed. Secured With [employer] 4x5 (in/yd) Discharge Instruction: Secure with Coban as directed. Kerlix Roll Sterile, 4.5x3.1 (in/yd) Discharge Instruction: Secure with Kerlix as directed. 106M Medipore H Soft Cloth Surgical T ape, 4 x 10 (in/yd) Discharge Instruction: Secure with tape as directed. Compression Wrap Compression Stockings Add-Ons Electronic Signature(s) Signed: 06/02/2021 4:14:19 PM By: Dellie Catholic RN Signed: 06/02/2021 5:58:06 PM By: Max Hurst RN, BSN Entered By: Dellie Catholic on 06/02/2021 08:02:02 -------------------------------------------------------------------------------- Vitals Details Patient Name: Date of Service: [name] 06/02/2021 7:45 A M Medical Record Number: [id number] Patient Account Number: [id number] Date of Birth/Sex: Treating RN: 1986-03-16 (36 y.o. [name] Primary Care Jamar Weatherall: [name] Other Clinician: Referring Shloimy Michalski: Treating Sharesa Kemp/Extender: [name] in Treatment: 47 Vital Signs Time Taken: 07:47 Temperature (F): 98.4 Height (in): 77 Pulse (bpm): 95 Weight (lbs): 280 Respiratory Rate (breaths/min): 18 Body Mass Index (BMI): 33.2 Blood Pressure (mmHg): 128/82 Capillary Blood Glucose (mg/dl): 100 Reference Range: 80 - 120 mg / dl Notes glucose per pt report Electronic Signature(s) Signed: 06/02/2021 5:58:06 PM By: Max Hurst RN, BSN Entered By: [name] on 06/02/2021 07:48:54

## 2021-06-09 ENCOUNTER — Other Ambulatory Visit: Payer: Self-pay

## 2021-06-09 ENCOUNTER — Encounter (HOSPITAL_BASED_OUTPATIENT_CLINIC_OR_DEPARTMENT_OTHER): Payer: BC Managed Care – PPO | Admitting: General Surgery

## 2021-06-09 DIAGNOSIS — E11621 Type 2 diabetes mellitus with foot ulcer: Secondary | ICD-10-CM | POA: Diagnosis not present

## 2021-06-09 NOTE — Progress Notes (Signed)
[place], [name] ([id number]) Visit Report for 06/09/2021 Chief Complaint Document Details Patient Name: Date of Service: [name] 06/09/2021 7:30 A M Medical Record Number: [id number] Patient Account Number: [id number] Date of Birth/Sex: Treating RN: 1986/02/27 (36 y.o. M) Primary Care Provider: [name] Other Clinician: Referring Provider: Treating Provider/Extender: [name] in Treatment: 21 Information Obtained from: Patient Chief Complaint 01/11/2019; patient is here for review of a rather substantial wound over the left fifth plantar metatarsal head extending into the lateral part of his foot 10/24/2019; patient returns to clinic with wounds on his bilateral feet with underlying osteomyelitis biopsy-proven Electronic Signature(s) Signed: 06/09/2021 8:31:15 AM By: [name] MD FACS Entered By: [name] on 06/09/2021 08:31:15 -------------------------------------------------------------------------------- Debridement Details Patient Name: Date of Service: [name], [name] 06/09/2021 7:30 A M Medical Record Number: [id number] Patient Account Number: [id number] Date of Birth/Sex: Treating RN: 1986-01-03 (36 y.o. [name] Primary Care Provider: [name] Other Clinician: Referring Provider: Treating Provider/Extender: [name] in Treatment: 84 Debridement Performed for Assessment: Wound #8 Right,Lateral Foot Performed By: Physician [name], MD Debridement Type: Debridement Severity of Tissue Pre Debridement: Fat layer exposed Level of Consciousness (Pre-procedure): Awake and Alert Pre-procedure Verification/Time Out Yes - 08:15 Taken: Start Time: 08:15 T Area Debrided (L x W): otal 3.5 (cm) x 2.7 (cm) = 9.45 (cm) Tissue and other material debrided: Non-Viable, Slough, Subcutaneous, Slough Level: Skin/Subcutaneous Tissue Debridement Description: Excisional Instrument: Blade,  Curette, Forceps Bleeding: Moderate Hemostasis Achieved: Silver Nitrate End Time: 08:20 Procedural Pain: 0 Post Procedural Pain: 0 Response to Treatment: Procedure was tolerated well Level of Consciousness (Post- Awake and Alert procedure): Post Debridement Measurements of Total Wound Length: (cm) 3.5 Width: (cm) 2.7 Depth: (cm) 0.1 Volume: (cm) 0.742 Character of Wound/Ulcer Post Debridement: Requires Further Debridement Severity of Tissue Post Debridement: Fat layer exposed Post Procedure Diagnosis Same as Pre-procedure Electronic Signature(s) Signed: 06/09/2021 12:45:07 PM By: [name] MD FACS Signed: 06/09/2021 6:27:47 PM By: [name] RN, BSN Entered By: [name] on 06/09/2021 08:19:28 -------------------------------------------------------------------------------- Debridement Details Patient Name: Date of Service: [name], [name] 06/09/2021 7:30 A M Medical Record Number: [id number] Patient Account Number: [id number] Date of Birth/Sex: Treating RN: 06/07/1985 (36 y.o. [name] Primary Care Provider: [name] Other Clinician: Referring Provider: Treating Provider/Extender: [name] in Treatment: 84 Debridement Performed for Assessment: Wound #3 Left,Lateral,Plantar Foot Performed By: Physician [name], MD Debridement Type: Debridement Severity of Tissue Pre Debridement: Fat layer exposed Level of Consciousness (Pre-procedure): Awake and Alert Pre-procedure Verification/Time Out Yes - 08:15 Taken: Start Time: 08:20 T Area Debrided (L x W): otal 3 (cm) x 3 (cm) = 9 (cm) Tissue and other material debrided: Non-Viable, Callus Level: Non-Viable Tissue Debridement Description: Selective/Open Wound Instrument: Blade, Forceps Bleeding: Moderate Hemostasis Achieved: Silver Nitrate End Time: 08:24 Procedural Pain: 0 Post Procedural Pain: 0 Response to Treatment: Procedure was tolerated well Level  of Consciousness (Post- Awake and Alert procedure): Post Debridement Measurements of Total Wound Length: (cm) 2.2 Width: (cm) 3.2 Depth: (cm) 0.1 Volume: (cm) 0.553 Character of Wound/Ulcer Post Debridement: Improved Severity of Tissue Post Debridement: Fat layer exposed Post Procedure Diagnosis Same as Pre-procedure Electronic Signature(s) Signed: 06/09/2021 12:45:07 PM By: [name] MD FACS Signed: 06/09/2021 6:27:47 PM By: [name] RN, BSN Entered By: [name] on 06/09/2021 08:30:00 -------------------------------------------------------------------------------- HPI Details Patient Name: Date of Service: [name], [name] 06/09/2021 7:30 A M Medical  Record Number: [id number] Patient Account Number: [id number] Date of Birth/Sex: Treating RN: 1985-06-20 (36 y.o. 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 He comes in for review of a wound over the plantar fifth metatarsal head extending into the lateral part of the foot. He was followed for this previously by his podiatrist Dr. Cornelius Moras. As the patient tells his story he went to see podiatry first for a swelling he developed on the lateral part of his fifth metatarsal head in May. He states this was "open" by podiatry and the area closed. He was followed up in June and it was again opened callus removed and it closed promptly. There were plans being made for surgery on the fifth metatarsal head in June however his blood sugar was apparently too high for anesthesia. Apparently the area was debrided and opened again in June and it is never closed since. Looking over the records from podiatry I am really not able to follow this. It was clear when he was first  seen it was before 5/14 at that point he already had a wound. By 5/17 the ulcer was resolved. I do not see anything about a procedure. On 5/28 noted to have pre-ulcerative moderate keratosis. X-ray noted 1/5 contracted toe and tailor's bunion and metatarsal deformity. On a visit date on 09/28/2018 the dorsal part of the left foot it healed and resolved. There was concern about swelling in his lower extremity he was sent to the ER.. As far as I can tell he was seen in the ER on 7/12 with an ulcer on his left foot. A DVT rule out of the left leg was negative. I do not think I have complete records from podiatry but I am not able to verify the procedures this patient states he had. He states after the last procedure the wound has never closed although I am not able to follow this in the records I have from podiatry. He has not had a recent x-ray The patient has been using Neosporin on the wound. He is wearing a Darco shoe. He is still very active up on his foot working and exercising. Past medical history; type 2 diabetes ketosis-prone, leg swelling with a negative DVT study in July. Non-smoker ABI in our clinic was 0.85 on the left 10/16; substantial wound on the plantar left fifth met head extending laterally almost to the dorsal fifth MTP. We have been using silver alginate we gave him a Darco forefoot off loader. An x-ray did not show evidence of osteomyelitis did note soft tissue emphysema which I think was due to gas tracking through an open wound. There is no doubt in my mind he requires an MRI 10/23; MRI not booked until 3 November at the earliest this is largely due to his glucose sensor in the right arm. We have been using silver alginate. There has been an improvement 10/29; I am still not exactly sure when his MRI is booked for. He says it is the third but it is the 10th in epic. This definitely needs to be done. He is running a low-grade fever today but no other symptoms. No real improvement  in the 1 02/26/2019 patient presents today for a follow-up visit here in our clinic he is last been seen in the clinic on  October 29. Subsequently we were working on getting MRI to evaluate and see what exactly was going on and where we would need to go from the standpoint of whether or not he had osteomyelitis and again what treatments were going be required. Subsequently the patient ended up being admitted to the hospital on 02/07/2019 and was discharged on 02/14/2019. This is a somewhat interesting admission with a discharge diagnosis of pneumonia due to COVID-19 although he was positive for COVID-19 when tested at the urgent care but negative x2 when he was actually in the hospital. With that being said he did have acute respiratory failure with hypoxia and it was noted he also have a left foot ulceration with osteomyelitis. With that being said he did require oxygen for his pneumonia and I level 4 L. He was placed on antivirals and steroids for the COVID-19. He was also transferred to the [place] at one point. Nonetheless he did subsequently discharged home and since being home has done much better in that regard. The CT angiogram did not show any pulmonary embolism. With regard to the osteomyelitis the patient was placed on vancomycin and Zosyn while in the hospital but has been changed to Augmentin at discharge. It was also recommended that he follow- up with wound care and podiatry. Podiatry however wanted him to see [name] according to the patient prior to them doing anything further. His hemoglobin A1c was 9.9 as noted in the hospital. Have an MRI of the left foot performed while in the hospital on 02/04/2019. 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 There is an overlying plantar open wound noted an abscess tracking back along the lateral aspect of the fifth metatarsal shaft. There is  otherwise diffuse cellulitis and mild fasciitis without findings of polymyositis. The patient did have recently pneumonia secondary to COVID-19 I looked in the chart through epic and it does appear that the patient may need to have an additional x-ray just to ensure everything is cleared and that he has no airspace disease prior to putting him into the [name]. 03/05/2019; patient was readmitted to the clinic last week. He was hospitalized twice for a viral upper respiratory tract infection from 11/1 through 11/4 and then 11/5 through 11/12 ultimately this turned out to be Covid pneumonitis. Although he was discharged on oxygen he is not using it. He says he feels fine. He has no exercise limitation no cough no sputum. His O2 sat in our clinic today was 100% on room air. 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 He received Vanco and Zosyn in the hospital and then was discharged on 2 weeks of Augmentin. I do not see any relevant cultures. He was supposed to follow-up with infectious disease but I do not see that he has an appointment. 12/8; patient saw Dr. Novella Olive of infectious disease last week. He felt that he had had adequate antibiotic therapy. He did not go to follow-up with Dr. Amalia Hailey of podiatry and I have again talked to him about the pros and cons of this. He does not want to consider a ray amputation of this time. He is aware of the risks of recurrence, migration etc. He started HBO today and tolerated this well. He can complete the Augmentin that I gave him last week. I have looked  over the lab work that Dr. Chana Bode ordered his C-reactive protein was 3.3 and his sedimentation rate was 17. The C-reactive protein is never really been measurably that high in this patient 12/15; not much change in the wound today however he has undermining along the lateral part of the foot again  more extensively than last week. He has some rims of epithelialization. We have been using silver alginate. He is undergoing hyperbarics but did not dive today 12/18; in for his obligatory first total contact cast change. Unfortunately there was pus coming from the undermining area around his fifth metatarsal head. This was cultured but will preclude reapplication of a cast. He is seen in conjunction with HBO 12/24; patient had staph lugdunensis in the wound in the undermining area laterally last time. We put him on doxycycline which should have covered this. The wound looks better today. I am going to give him another week of doxycycline before reattempting the total contact cast 12/31; the patient is completing antibiotics. Hemorrhagic debris in the distal part of the wound with some undermining distally. He also had hyper granulation. Extensive debridement with a #5 curette. The infected area that was on the lateral part of the fifth met head is closed over. I do not think he needs any more antibiotics. Patient was seen prior to HBO. Preparations for a total contact cast were made in the cast will be placed post hyperbarics 04/11/19; once again the patient arrives today without complaint. He had been in a cast all week noted that he had heavy drainage this week. This resulted in large raised areas of macerated tissue around the wound 1/14; wound bed looks better slightly smaller. Hydrofera Blue has been changing himself. He had a heavy drainage last week which caused a lot of maceration around the wound so I took him out of a total contact cast he says the drainage is actually better this week He is seen today in conjunction with HBO 1/21; returns to clinic. He was up in [place] for a day or 2 attending a funeral. He comes back in with the wound larger and with a large area of exposed bone. He had osteomyelitis and septic arthritis of the fifth left metatarsal head while he was in hospital. He  received IV antibiotics in the hospital for a prolonged period of time then 3 weeks of Augmentin. Subsequently I gave him 2 weeks of doxycycline for more superficial wound infection. When I saw this last week the wound was smaller the surface of the wound looks satisfactory. 1/28; patient missed hyperbarics today. Bone biopsy I did last time showed Enterococcus faecalis and Staphylococcus lugdunensis . He has a wide area of exposed bone. We are going to use silver alginate as of today. I had another ethical discussion with the patient. 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 In this situation I think the likelihood of healing this is low. Therefore I have recommended a ray amputation and with the patient's agreement I have referred him to Dr. Doran Durand. The other issue is that his compliance with hyperbarics has been minimal because of his work schedule and given his underlying decision I am going to stop this today READMISSION 10/24/2019 MRI 09/29/2019 left foot IMPRESSION: 1. Apparent skin ulceration inferior and lateral to the 5th metatarsal base with underlying heterogeneous T2 signal and enhancement in the subcutaneous fat. Small peripherally enhancing fluid collections along the plantar and lateral aspects of the 5th metatarsal base suspicious for abscesses.  2. Interval amputation through the mid 5th metatarsal with nonspecific low-level marrow edema and enhancement. Given the proximity to the adjacent soft tissue inflammatory changes, osteomyelitis cannot be excluded. 3. The additional bones appear unremarkable. MRI 09/29/2019 right foot IMPRESSION: 1. Soft tissue ulceration lateral to the 5th MTP joint. There is low-level T2 hyperintensity within the 4th and 5th metatarsal heads and adjacent proximal phalanges without abnormal T1 signal or cortical destruction. These findings are nonspecific and could be seen with early marrow edema, hyperemia or  early osteomyelitis. No evidence of septic joint. 2. Mild tenosynovitis and synovial enhancement associated with the extensor digitorum tendons at the level of the midfoot. 3. Diffuse low-level muscular T2 hyperintensity and enhancement, most consistent with diabetic myopathy. LEFT FOOT BONE Methicillin resistant staphylococcus aureus Staphylococcus lugdunensis MIC MIC CIPROFLOXACIN >=8 RESISTANT Resistant <=0.5 SENSI... Sensitive CLINDAMYCIN <=0.25 SENS... Sensitive >=8 RESISTANT Resistant ERYTHROMYCIN >=8 RESISTANT Resistant >=8 RESISTANT Resistant GENTAMICIN <=0.5 SENSI... Sensitive <=0.5 SENSI... Sensitive Inducible Clindamycin NEGATIVE Sensitive NEGATIVE Sensitive OXACILLIN >=4 RESISTANT Resistant 2 SENSITIVE Sensitive RIFAMPIN <=0.5 SENSI... Sensitive <=0.5 SENSI... Sensitive TETRACYCLINE <=1 SENSITIVE Sensitive <=1 SENSITIVE Sensitive TRIMETH/SULFA <=10 SENSIT Sensitive <=10 SENSIT Sensitive ... Marland Kitchen.. VANCOMYCIN 1 SENSITIVE Sensitive <=0.5 SENSI... Sensitive Right foot bone . Component 3 wk ago Specimen Description BONE Special Requests RIGHT 4 METATARSAL SAMPLE B Gram Stain NO WBC SEEN NO ORGANISMS SEEN Culture RARE METHICILLIN RESISTANT STAPHYLOCOCCUS AUREUS NO ANAEROBES ISOLATED Performed at [facility], [place] [address] [place], [place] [address] Report Status 10/08/2019 FINAL Organism ID, Bacteria METHICILLIN RESISTANT STAPHYLOCOCCUS AUREUS Resulting Agency [facility] Susceptibility Methicillin resistant staphylococcus aureus MIC CIPROFLOXACIN >=8 RESISTANT Resistant CLINDAMYCIN <=0.25 SENS... Sensitive ERYTHROMYCIN >=8 RESISTANT Resistant GENTAMICIN <=0.5 SENSI... Sensitive Inducible Clindamycin NEGATIVE Sensitive OXACILLIN >=4 RESISTANT Resistant RIFAMPIN <=0.5 SENSI... Sensitive TETRACYCLINE <=1 SENSITIVE Sensitive TRIMETH/SULFA <=10 SENSIT Sensitive ... VANCOMYCIN 1 SENSITIVE Sensitive This is a patient we had in clinic earlier this year with a  wound over his left fifth metatarsal head. 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 Doran Durand on 05/16/2019. He seems to have developed problems with wounds on his bilateral feet in June 2021 although he may have had problems earlier than this. He was in an urgent care with a right foot ulcer on 09/26/2019 and given a course of doxycycline. This was apparently after having trouble getting into see orthopedics. He was seen by podiatry on 09/28/2019 noted to have bilateral lower extremity ulcers including the left lateral fifth metatarsal base and the right subfifth met head. It was noted that had purulent drainage at that time. He required hospitalization from 6/20 through 7/2. This was because of worsening right foot wounds. He underwent bilateral operative incision and drainage and bone biopsies bilaterally. Culture results are listed above. He has been referred back to clinic by Dr. Jacqualyn Posey of podiatry. He is also followed by Dr. Megan Salon who saw him yesterday. He was discharged from hospital on Zyvox Flagyl and Levaquin and yesterday changed to doxycycline Flagyl and Levaquin. His inflammatory markers on 6/26 showed a sedimentation rate of 129 and a C-reactive protein of 5. This is improved to 14 and 1.3 respectively. This would indicate improvement. ABIs in our clinic today were 1.23 on the right and 1.20 on the left  11/01/2019 on evaluation today patient appears to be doing fairly well in regard to the wounds on his feet at this point. Fortunately there is no signs of active infection at this time. No fevers, chills, nausea, vomiting, or diarrhea. He currently is seeing infectious disease and still under their care at this  point. Subsequently he also has both wounds which she has not been using collagen on as he did not receive that in his packaging he did not call us and let us know that. Apparently that just was missed on the order. Nonetheless we will get that straightened out today. 8/9-Patient returns for bilateral foot wounds, using Prisma with hydrogel moistened dressings, and the wounds appear stable. Patient using surgical shoes, avoiding much pressure or weightbearing as much as possible 8/16; patient has bilateral foot wounds. 1 on the right lateral foot proximally the other is on the left mid lateral foot. Both required debridement of callus and thick skin around the wounds. We have been using silver collagen 8/27; patient has bilateral lateral foot wounds. The area on the left substantially surrounded by callus and dry skin. This was removed from the wound edge. The underlying wound is small. The area on the right measured somewhat smaller today. 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 Unfortunately I did not update his antibiotics during today's visit. 9/10 I reviewed Dr. Hale Bogus last notes he felt he had completed antibiotics his inflammatory markers were reasonably well controlled. He has a small wound on the lateral left foot and a tiny area on the right which is just above closed. He is using Hydrofera Blue with border foam he has bilateral surgical shoes 9/24; 2 week f/u. doing well. right foot is closed. left foot still undermined. 10/14; right foot remains closed at the fifth met head. The area over the base of the left fifth metatarsal has a small open area but considerable undermining towards the plantar foot. Thick callus skin around this suggests an adequate pressure relief. We have talked about this. He says he is going to go back into his cam boot. I suggested a total contact cast he did not seem enamored with this  suggestion 10/26; left foot base of the fifth metatarsal. Same condition as last time. He has skin over the area with an open wound however the skin is not adherent. He went to see Dr. Earleen Newport who did an x-ray and culture of his foot I have not reviewed the x-ray but the patient was not told anything. He is on doxycycline 11/11; since the patient was last here he was in the emergency room on 10/30 he was concerned about swelling in the left foot. They did not do any cultures or x-rays. They changed his antibiotics to cephalexin. Previous culture showed group B strep. The cephalexin is appropriate as doxycycline has less than predictable coverage. Arrives in clinic today with swelling over this area under the wound. He also has a new wound on the right fifth metatarsal head 11/18; the patient has a difficult wound on the lateral aspect of the left fifth metatarsal head. The wound was almost ballotable last week I opened it slightly expecting to see purulence however there was just bleeding. I cultured this this was negative. X-ray unchanged. We are trying to get an MRI but I am not sure were going to be able to get this through his insurance. He also has an area on the right lateral  fifth metatarsal head this looks healthier 12/3; the patient finally got our MRI. Surprisingly this did not show osteomyelitis. I did show the soft tissue ulceration at the lateral plantar aspect of the fifth metatarsal base with a tiny residual 6 mm abscess overlying the superficial fascia I have tried to culture this area I have not been able to get this to grow anything. Nevertheless the protruding tissue looks aggravated. I suspect we should try to treat the underlying "abscess with broad-spectrum antibiotics. I am going to start him on Levaquin and Flagyl. He has much less edema in his legs and I am going to continue to wrap his legs and see him weekly 12/10. I started Levaquin and Flagyl on him last week. He just picked  up the Flagyl apparently there was some delay. The worry is the wound on the left fifth metatarsal base which is substantial and worsening. His foot looks like he inverts at the ankle making this a weightbearing surface. Certainly no improvement in fact I think the measurements of this are somewhat worse. We have been using 12/17; he apparently just got the Levaquin yesterday this is 2 weeks after the fact. He has completed the Flagyl. The area over the left fifth metatarsal base still has protruding granulation tissue although it does not look quite as bad as it did some weeks ago. He has severe bilateral lymphedema although we have not been treating him for wounds on his legs this is definitely going to require compression. There was so much edema in the left I did not wish to put him in a total contact cast today. I am going to increase his compression from 3-4 layer. The area on the right lateral fifth met head actually look quite good and superficial. 12/23; patient arrived with callus on the right fifth met head and the substantial hyper granulated callused wound on the base of his fifth metatarsal. He says he is completing his Levaquin in 2 days but I do not think that adds up with what I gave him but I will have to double check this. We are using Hydrofera Blue on both areas. My plan is to put the left leg in a cast the week after New Year's 04/06/2020; patient's wounds about the same. Right lateral fifth metatarsal head and left lateral foot over the base of the fifth metatarsal. There is undermining on the left lateral foot which I removed before application of total contact cast continuing with Hydrofera Blue new. Patient tells me he was seen by endocrinology today lab work was done [Dr. Kerr]. Also wondering whether he was referred to cardiology. I went over some lab work from previously does not have chronic renal failure certainly not nephrotic range proteinuria he does have very poorly  controlled diabetes but this is not his most updated lab work. Hemoglobin A1c has been over 11 1/10; the patient had a considerable amount of leakage towards mid part of his left foot with macerated skin however the wound surface looks better the area on the right lateral fifth met head is better as well. I am going to change the dressing on the left foot under the total contact cast to silver alginate, continue with Hydrofera Blue on the right. 1/20; patient was in the total contact cast for 10 days. Considerable amount of drainage although the skin around the wound does not look too bad on the left foot. The area on the right fifth metatarsal head is closed. Our nursing staff reports  large amount of drainage out of the left lateral foot wound 1/25; continues with copious amounts of drainage described by our intake staff. PCR culture I did last week showed E. coli and Enterococcus faecalis and low quantities. Multiple resistance genes documented including extended spectrum beta lactamase, MRSA, MRSE, quinolone, tetracycline. The wound is not quite as good this week as it was 5 days ago but about the same size 2/3; continues with copious amounts of malodorous drainage per our intake nurse. The PCR culture I did 2 weeks ago showed E. coli and low quantities of Enterococcus. There were multiple resistance genes detected. I put Neosporin on him last week although this does not seem to have helped. The wound is slightly deeper today. Offloading continues to be an issue here although with the amount of drainage she has a total contact cast is just not going to work 2/10; moderate amount of drainage. Patient reports he cannot get his stocking on over the dressing. I told him we have to do that the nurse gave him suggestions on how to make this work. The wound is on the bottom and lateral part of his left foot. Is cultured predominantly grew low amounts of Enterococcus, E. coli and anaerobes. There were  multiple resistance genes detected including extended spectrum beta lactamase, quinolone, tetracycline. I could not think of an easy oral combination to address this so for now I am going to do topical antibiotics provided by [facility] I think the main agents here are vancomycin and an aminoglycoside. We have to be able to give him access to the wounds to get the topical antibiotic on 2/17; moderate amount of drainage this is unchanged. He has his Keystone topical antibiotic against the deep tissue culture organisms. He has been using this and changing the dressing daily. Silver alginate on the wound surface. 2/24; using Keystone antibiotic with silver alginate on the top. He had too much drainage for a total contact cast at one point although I think that is improving and I think in the next week or 2 it might be possible to replace a total contact cast I did not do this today. In general the wound surface looks healthy however he continues to have thick rims of skin and subcutaneous tissue around the wide area of the circumference which I debrided 06/04/2020 upon evaluation today patient appears to be doing well in regard to his wound. I do feel like he is showing signs of improvement. There is little bit of callus and dead tissue around the edges of the wound as well as what appears to be a little bit of a sinus tract that is off to the side laterally I would perform debridement to clear that away today. 3/17; left lateral foot. The wound looks about the same as I remember. Not much depth surface looks healthy. No evidence of infection 3/25; left lateral foot. Wound surface looks about the same. Separating epithelium from the circumference. There really is no evidence of infection here however not making progress by my view 3/29; left lateral foot. Surface of the wound again looks reasonably healthy still thick skin and subcutaneous tissue around the wound margins. There is no evidence of  infection. One of the concerns being brought up by the nurses has again the amount of drainage vis--vis continued use of a total contact cast 4/5; left lateral foot at roughly the base of the fifth metatarsal. Nice healthy looking granulated tissue with rims of epithelialization. The overall wound measurements are  not any better but the tissue looks healthy. The only concern is the amount of drainage although he has no surrounding maceration with what we have been doing recently to absorb fluid and protect his skin. He also has lymphedema. 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 He states that the kerlix/coban under the TCC rolled down and could not get it back up. He has been using an offloading scooter and has somehow hurt his right foot using this device. This happened last week. He states that the side of his right foot developed a blister and opened. The top of his foot also has a few small open wounds he thinks is due to his socks rubbing in his shoes. He has not been using any dressings to the wound. He denies purulent drainage, fever/chills or erythema to the wounds. 4/22; patient presents for 1 week follow-up. He developed new wounds to the right foot that were evaluated at last clinic visit. He continues to have a total contact cast to the left leg and he reports no issues. He has been using silver collagen to the right foot wounds with no issues. He denies purulent drainage, fever/chills or erythema to the right foot wounds. He has no complaints today 4/25; patient presents for 1 week follow-up. He has a total contact cast of the left leg and reports no issues. He has been using silver alginate to the right foot wound. He denies purulent  drainage, fever/chills or erythema to the right foot wounds. 5/2 patient presents for 1 week follow-up. T contact cast on the left. The wound which is on the base of the plantar foot at the base of the fifth metatarsal otal actually looks quite good and dimensions continue to gradually contract. HOWEVER the area on the right lateral fifth metatarsal head is much larger than what I remember from 2 weeks ago. Once more is he has significant levels of hypergranulation. Noteworthy that he had this same hyper granulated response on his wound on the left foot at one point in time. So much so that he I thought there was an underlying fluid collection. Based on this I think this just needs debridement. 5/9; the wound on the left actually continues to be gradually smaller with a healthy surface. Slight amount of drainage and maceration of the skin around but not too bad. However he has a large wound over the right fifth metatarsal head very much in the same configuration as his left foot wound was initially. I used silver nitrate to address the hyper granulated tissue no mechanical debridement 5/16; area on the left foot did not look as healthy this week deeper thick surrounding macerated skin and subcutaneous tissue. The area on the right foot fifth met head was about the same The area on the right ankle that we identified last week is completely broken down into an open wound presumably a stocking rubbing issue 5/23; patient has been using a total contact cast to the left side. He has been using silver alginate underneath. He has also been using silver alginate to the right foot wounds. He has no complaints today. He denies any signs of infection. 5/31; the left-sided wound looks some better measure smaller surface granulation looks better.  We have been using silver alginate under the total contact cast The large area on his right fifth met head and right dorsal foot look about the same still using silver  alginate 6/6; neither side is good as I was hoping although the surface area dimensions are better. A lot of maceration on his left and right foot around the wound edge. Area on the dorsal right foot looks better. He says he was traveling. I am not sure what does the amount of maceration around the plantar wounds may be drainage issues 6/13; in general the wound surfaces look quite good on both sides. Macerated skin and raised edges around the wound required debridement although in general especially on the left the surface area seems improved. The area on the right dorsal ankle is about the same I thought this would not be such a problem to close 6/20; not much change in either wound although the one on the right looks a little better. Both wounds have thick macerated edges to the skin requiring debridements. We have been using silver alginate. The area on the dorsal right ankle is still open I thought this would be closed. 6/28; patient comes in today with a marked deterioration in the right foot wound fifth met head. Wide area of exposed bone this is a drastic change from last time. The area on the left there we have been casting is stagnant. We have been using silver alginate in both wound areas. 7/5; bone culture I did for PCR last time was positive for Pseudomonas, group B strep, Enterococcus and Staph aureus. There was no suggestion of methicillin resistance or ampicillin resistant genes. This was resistant to tetracycline however He comes into the clinic today with the area over his right plantar fifth metatarsal head which had been doing so well 2 weeks ago completely necrotic feeling bone. I do not know that this is going to be salvageable. The left foot wound is certainly no smaller but it has a better surface and is superficial. 7/8; patient called in this morning to say that his total contact cast was rubbing against his foot. He states he is doing fine overall. He denies signs  of infection. 7/12; continued deterioration in the wound over the right fifth metatarsal head crumbling bone. This is not going to be salvageable. The patient agrees and wants to be referred to Dr. Doran Durand which we will attempt to arrange as soon as possible. I am going to continue him on antibiotics as long as that takes so I will renew those today. The area on the left foot which is the base of the fifth metatarsal continues to look somewhat better. Healthy looking tissue no depth no debridement is necessary here. 7/20; the patient was kindly seen by Dr. Doran Durand of orthopedics on 10/19/2020. 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 Towards this end we have taken him out of the total contact cast on the left we will put him in a wrap with Hydrofera Blue. As I understand things surgery is planned for 7/21 7/27; patient had his surgery last Thursday. 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 Apparently everything went well we did not still disturb that today The area on the left foot actually looks quite good. He has been much less mobile which probably explains this he did not seem to do well in the total contact cast  secondary to drainage and maceration I think. 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 Fortunately there is no evidence of active infection at this time. No fevers, chills, nausea, vomiting, or diarrhea. Overall I think that he is actually doing extremely well. Nonetheless I do believe that he is staying off of this more following the surgery in his right foot that is the reason the left is doing so great. 8/16; left plantar foot wound. This looks smaller than the last time I saw this he is using Hydrofera Blue. The surgical wound on the right foot is being followed by Dr. Doran Durand we  did not look at this today. He has surgical shoes on both feet 8/23; left plantar foot wound not as good this week. Surrounding macerated skin and subcutaneous tissue everything looks moist and wet. I do not think he is offloading this adequately. He is using a surgical shoe Apparently the right foot surgical wound is not open although I did not check his foot 8/31; left plantar foot lateral aspect. Much improved this week. He has no maceration. Some improvement in the surface area of the wound but most impressively the depth is come in we are using silver alginate. The patient is a Product/process development scientist. He is asked that we write him a letter so he can go back to work. I have also tried to see if we can write something that will allow him to limit the amount of time that he is on his foot at work. 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 Hopefully the latter can be avoided 9/6; I believe the patient missed an appointment last week. He arrives in today with a wound looking roughly the same certainly no better. Undermining laterally and also inferiorly. We used molecuLight today in training with the patient's permission.. We are using silver alginate 9/21 wound is measuring bigger this week although this may have to do with the aggressive circumferential debridement last week in response to the blush fluorescence on the MolecuLight. Culture I did last week showed significant MSSA and E. coli. I put him on Augmentin but he has not started it yet. We are also going to send this for compounded antibiotics at [facility]. There is no evidence of systemic infection 9/29; silver alginate. His Keystone arrived. He is completing Augmentin in 2 days. Offloading in a cam boot. Moderate drainage per our intake staff 10/5; using silver alginate. He has been using his [place]. He has completed his Augmentin. Per our intake nurse still  a lot of drainage, far too much to consider a total contact cast. Wound measures about the same. He had the same undermining area that I defined last week from a roughly 11-3. I remove this today 10/12; using silver alginate he is using the Champ. He comes in for a nurse visit hence we are applying [employer] twice a week. Measuring slightly better today and less notable drainage. Extensive debridement of the wound edge last time 10/18; using topical Keystone and silver alginate and a soft cast. Wound measurements about the same. Drainage was through his soft cast. We are changing this twice a week Tuesdays and Friday 10/25; comes in with moderate drainage. Still using Keystone silver alginate and a soft cast. Wound dimensions completely the same.He has a lot  of edema in the left leg he has lymphedema. Asking for [name] to consider wrapping him as he cannot get his stocking on over the soft cast 11/2; comes in with moderate to large drainage slightly smaller in terms of width we have been using [place]. His wound looks satisfactory but not much improvement 11/4; patient presents today for obligatory cast change. Has no issues or complaints today. He denies signs of infection. 11/9; patient traveled this weekend to [place], was on the cast quite a bit. Staining of the cast with black material from his walking boot. Drainage was not quite as bad as we feared. Using silver alginate and Keystone 11/16; we do not have size for cast therefore we have been putting a soft cast on him since the change on Friday. Still a significant amount of drainage necessitating changing twice a week. We have been using the Keystone at cast changes either hard or soft as well as silver alginate Comes in the clinic with things actually looking fairly good improvement in width. He says his offloading is about the same 02/24/2021 upon evaluation today patient actually comes back in and is doing excellent in regard to his foot ulcer  this is significantly smaller even compared to the last visit. The soft cast seems to have done extremely well for him which is great news. I do not see any signs of infection minimal debridement will be needed today. 11/30; left lateral foot much improved half a centimeter improvement in surface area. No evidence of infection. He seems to be doing better with the soft cast in the TCC therefore we will continue with this. He comes back in later in the week for a change with the nurses. This is due to drainage 12/6; no improvement in dimensions. Under illumination some debris on the surface we have been using silver alginate, soft cast. If there is anything optimistic here he seems to have have less drainage 12/13. Dimensions are improved both length and width and slightly in depth. Appears to be quite healthy today. Raised edges of this thick skin and callus around the edges however. He is in a soft cast were bringing him back once for a change on Friday. Drainage is better 12/20. Dimensions are improved. He still has raised edges of thick skin and callus around the edges. We are using a soft cast 12/28; comes in today with thick callus around the wound. Using silver under alginate under a soft cast. I do not think there is much improvement in any measurement 2023 04/06/2021; patient was put in a total contact cast. Unfortunately not much change in surface area 1/10; not much different still thick callus and skin around the edge in spite of the total contact cast. This was just debrided last week we have been using the [facility] compounded antibiotic and silver alginate under a total contact cast 1/18 the patient's wound on the left side is doing nicely. smaller HOWEVER he comes in today with a wound on the right foot laterally. blister most likely serosangquenous drainage 1/24; the patient continues to do well in terms of the plantar left foot which is continued to contract using silver alginate  under the total contact cast HOWEVER the right lateral foot is bigger with denuded skin around the edges. I used pickups and a #15 scalpel to remove this this looks like the remanence of a large blister. Cannot rule out infection. Culture in this area I did last week showed Staphylococcus lugdunensis few colonies. I am  going to try to address this with his [employer] antibiotic that is done so well on the left having linezolid and this should cover the staph 2/1; the patient's wound on his left foot which was the original plantar foot wound thick skin and eschar around the edges even in the total contact cast but the wound surface does not look too bad The real problem is on how his right lateral foot at roughly the base of the fifth metatarsal. The wound is completely necrotic more worrisome than that there is swelling around the edges of this. We have been using silver alginate on both wounds and Keystone on the right foot. Unfortunately I think he is going to require systemic antibiotics while we await cultures. He did not get the x-ray done that we ordered last week [lost the prescription 2/7; disappointingly in the area on the left foot which we are treating with a total contact cast is still not closed although it is much smaller. He continues to have a lot of callus around the wound edge. -Right lateral foot culture I did last week was negative x-ray also negative for osteomyelitis. 2/15: TCC silver alginate on the left and silver alginate on the right lateral. No real improvement in either area 05/26/2021: T oday, the wounds are roughly the same size as at his previous visit, post-debridement. He continues to endorse fairly substantial drainage, particularly on the right. He has been in a total contact cast on the left. There is still some callus surrounding this lesion. On the right, the periwound skin is quite macerated, along with surrounding callus. The center of the right-sided wound also  has some dark, densely adherent material, which is very difficult to remove. 06/02/2021: Today, both wounds are slightly smaller. He has been using zinc oxide ointment around the right ulcer and the degree of maceration has improved markedly. There continues to be an area of nonviable tissue in the center of the right sided ulcer. The left-sided wound, which has been in the total contact cast. Appears clean and the degree of callus around it is less than previously. 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 He had to take the stairs and both wounds have increased in size. The left foot, which has been in a total contact cast, has developed a tunnel tracking to the lateral aspect of his foot. The nonviable tissue in the center of the right-sided ulcer remains recalcitrant to debridement. There is significant undermining surrounding the entirety of the left sided wound. Electronic Signature(s) Signed: 06/09/2021 8:35:19 AM By: [name] MD FACS Entered By: [name] on 06/09/2021 08:35:19 -------------------------------------------------------------------------------- Physical Exam Details Patient Name: Date of Service: [name], [name] 06/09/2021 7:30 A M Medical Record Number: [id number] Patient Account Number: [id number] Date of Birth/Sex: Treating RN: 02-02-1986 (36 y.o. M) Primary Care Provider: [name] Other Clinician: Referring Provider: Treating Provider/Extender: [name] in Treatment: 71 Constitutional He is hypertensive. Tachycardic, asymptomatic.. . . No acute distress. Respiratory Normal work of breathing on room air. Notes 06/09/2021: Wound evaluationcallus present around the right sided ulcer, but less than on previous visits. The center of the right foot wound continues to have nonviable tissue, but it is still very difficult to debride. On the left side,  unfortunately this wound has expanded quite a bit. Extensive undermining was present, all of which was removed. I opened the tunnel that tracks to the lateral foot  and removed overlying nonviable tissue as well. Electronic Signature(s) Signed: 06/09/2021 8:39:59 AM By: [name] MD FACS Entered By: [name] on 06/09/2021 08:39:59 -------------------------------------------------------------------------------- Physician Orders Details Patient Name: Date of Service: [name], [name] 06/09/2021 7:30 A M Medical Record Number: [id number] Patient Account Number: [id number] Date of Birth/Sex: Treating RN: Nov 03, 1985 (36 y.o. 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 - Dr. [name] Return A Bathing/ Shower/ Hygiene May shower with protection but do not get wound dressing(s) wet. - Use Cast Protector Bag Edema Control - Lymphedema / SCD / Other Bilateral Lower Extremities Elevate legs to the level of the heart or above for 30 minutes daily and/or when sitting, a frequency of: - throughout the day Avoid standing for long periods of time. Exercise regularly Moisturize legs daily. - right leg every night before bed. Compression stocking or Garment 20-30 mm/Hg pressure to: - Apply to right leg in the morning and remove at night. Off-Loading Total Contact Cast to Left Lower Extremity Open toe surgical shoe to: - right foot Other: - minimal weight bearing left foot Additional Orders /  Instructions Follow Nutritious Diet Wound Treatment Wound #3 - Foot Wound Laterality: Plantar, Left, Lateral Cleanser: Soap and Water 1 x Per Week/7 Days Discharge Instructions: May shower and wash wound with dial antibacterial soap and water prior to dressing change. Topical: Keystone antibiotic compound 1 x Per Week/7 Days Discharge Instructions: Apply to wound bed Prim Dressing: Hydrofera Blue Classic Foam, 2x2 in 1 x Per Week/7 Days ary Discharge Instructions: Moisten with saline prior to applying to wound bed Secondary Dressing: Woven Gauze Sponge, Non-Sterile 4x4 in 1 x Per Week/7 Days Discharge Instructions: Apply over primary dressing as directed. Secondary Dressing: Optifoam Non-Adhesive Dressing, 4x4 in 1 x Per Week/7 Days Discharge Instructions: Apply over primary dressing as directed. Compression Wrap: Kerlix Roll 4.5x3.1 (in/yd) 1 x Per Week/7 Days Discharge Instructions: Apply Kerlix and Coban compression as directed. Compression Wrap: Coban Self-Adherent Wrap 4x5 (in/yd) 1 x Per Week/7 Days Discharge Instructions: Apply over Kerlix as directed. Wound #8 - Foot Wound Laterality: Right, Lateral Cleanser: Soap and Water Every Other Day/15 Days Discharge Instructions: May shower and wash wound with dial antibacterial soap and water prior to dressing change. Cleanser: Byram Ancillary Kit - 15 Day Supply (Generic) Every Other Day/15 Days Discharge Instructions: Use supplies as instructed; Kit contains: (15) Saline Bullets; (15) 3x3 Gauze; 15 pr Gloves Peri-Wound Care: Zinc Oxide Ointment 30g tube Every Other Day/15 Days Discharge Instructions: Apply Zinc Oxide to periwound with each dressing change Peri-Wound Care: Sween Lotion (Moisturizing lotion) Every Other Day/15 Days Discharge Instructions: Apply moisturizing lotion as directed Prim Dressing: Hydrofera Blue Classic Foam, 4x4 in (Generic) Every Other Day/15 Days ary Discharge Instructions: Moisten with saline prior to  applying to wound bed Prim Dressing: Santyl Ointment Every Other Day/15 Days ary Discharge Instructions: Apply nickel thick amount to wound bed, cover with Hydrofera Secondary Dressing: Woven Gauze Sponge, Non-Sterile 4x4 in (Generic) Every Other Day/15 Days Discharge Instructions: Apply over primary dressing as directed. Secondary Dressing: Optifoam Non-Adhesive Dressing, 4x4 in (Generic) Every Other  Day/15 Days Discharge Instructions: Apply over primary dressing as directed. Secured With: Coban Self-Adherent Wrap 4x5 (in/yd) (Generic) Every Other Day/15 Days Discharge Instructions: Secure with Coban as directed. Secured With: [employer], 4.5x3.1 (in/yd) (Generic) Every Other Day/15 Days Discharge Instructions: Secure with Kerlix as directed. Secured With: 40M Medipore H Soft Cloth Surgical T ape, 4 x 10 (in/yd) (Generic) Every Other Day/15 Days Discharge Instructions: Secure with tape as directed. Wound #9 - Foot Wound Laterality: Left, Lateral Cleanser: Soap and Water 1 x Per Week/7 Days Discharge Instructions: May shower and wash wound with dial antibacterial soap and water prior to dressing change. Topical: Keystone antibiotic compound 1 x Per Week/7 Days Discharge Instructions: Apply to wound bed Prim Dressing: Hydrofera Blue Classic Foam, 2x2 in 1 x Per Week/7 Days ary Discharge Instructions: Moisten with saline prior to applying to wound bed Secondary Dressing: Woven Gauze Sponge, Non-Sterile 4x4 in 1 x Per Week/7 Days Discharge Instructions: Apply over primary dressing as directed. Secondary Dressing: Optifoam Non-Adhesive Dressing, 4x4 in 1 x Per Week/7 Days Discharge Instructions: Apply over primary dressing as directed. Compression Wrap: Kerlix Roll 4.5x3.1 (in/yd) 1 x Per Week/7 Days Discharge Instructions: Apply Kerlix and Coban compression as directed. Compression Wrap: Coban Self-Adherent Wrap 4x5 (in/yd) 1 x Per Week/7 Days Discharge Instructions: Apply over  Kerlix as directed. Electronic Signature(s) Signed: 06/09/2021 12:45:07 PM By: [name] MD FACS Entered By: [name] on 06/09/2021 08:40:18 -------------------------------------------------------------------------------- Problem List Details Patient Name: Date of Service: [name], [name] 06/09/2021 7:30 A M Medical Record Number: [id number] Patient Account Number: [id number] Date of Birth/Sex: Treating RN: [birth year] (35 y.o. M) Primary Care Provider: [name] Other Clinician: Referring Provider: Treating Provider/Extender: [name] in Treatment: 46 Active Problems ICD-10 Encounter Code Description Active Date MDM Diagnosis E11.621 Type 2 diabetes mellitus with foot ulcer 10/24/2019 No Yes L97.528 Non-pressure chronic ulcer of other part of left foot with other specified 10/24/2019 No Yes severity L97.518 Non-pressure chronic ulcer of other part of right foot with other specified 10/24/2019 No Yes severity Inactive Problems ICD-10 Code Description Active Date Inactive Date L97.518 Non-pressure chronic ulcer of other part of right foot with other specified severity 07/14/2020 07/14/2020 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, right ankle and foot 10/24/2019 10/24/2019 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 08/10/2020 08/10/2020 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, left ankle and foot 10/24/2019 10/24/2019 B95.62 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 10/24/2019 10/24/2019 classified elsewhere Resolved Problems Electronic Signature(s) Signed: 06/09/2021 8:29:49 AM By: [name] MD FACS Entered By: [name] on 06/09/2021 08:29:48 -------------------------------------------------------------------------------- Progress Note Details Patient Name: Date of Service: [name], [name] 06/09/2021 7:30 A M Medical Record Number: [id number] Patient  Account Number: [id number] Date of Birth/Sex: Treating RN: 01-Apr-1986 (36 y.o. 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 He comes in for review of a wound over the plantar fifth metatarsal head extending into the lateral part of the foot. He was followed  for this previously by his podiatrist Dr. Cornelius Moras. As the patient tells his story he went to see podiatry first for a swelling he developed on the lateral part of his fifth metatarsal head in May. He states this was "open" by podiatry and the area closed. He was followed up in June and it was again opened callus removed and it closed promptly. There were plans being made for surgery on the fifth metatarsal head in June however his blood sugar was apparently too high for anesthesia. Apparently the area was debrided and opened again in June and it is never closed since. Looking over the records from podiatry I am really not able to follow this. It was clear when he was first seen it was before 5/14 at that point he already had a wound. By 5/17 the ulcer was resolved. I do not see anything about a procedure. On 5/28 noted to have pre-ulcerative moderate keratosis. X-ray noted 1/5 contracted toe and tailor's bunion and metatarsal deformity. On a visit date on 09/28/2018 the dorsal part of the left foot it healed and resolved. There was concern about swelling in his lower  extremity he was sent to the ER.. As far as I can tell he was seen in the ER on 7/12 with an ulcer on his left foot. A DVT rule out of the left leg was negative. I do not think I have complete records from podiatry but I am not able to verify the procedures this patient states he had. He states after the last procedure the wound has never closed although I am not able to follow this in the records I have from podiatry. He has not had a recent x-ray The patient has been using Neosporin on the wound. He is wearing a Darco shoe. He is still very active up on his foot working and exercising. Past medical history; type 2 diabetes ketosis-prone, leg swelling with a negative DVT study in July. Non-smoker ABI in our clinic was 0.85 on the left 10/16; substantial wound on the plantar left fifth met head extending laterally almost to the dorsal fifth MTP. We have been using silver alginate we gave him a Darco forefoot off loader. An x-ray did not show evidence of osteomyelitis did note soft tissue emphysema which I think was due to gas tracking through an open wound. There is no doubt in my mind he requires an MRI 10/23; MRI not booked until 3 November at the earliest this is largely due to his glucose sensor in the right arm. We have been using silver alginate. There has been an improvement 10/29; I am still not exactly sure when his MRI is booked for. He says it is the third but it is the 10th in epic. This definitely needs to be done. He is running a low-grade fever today but no other symptoms. No real improvement in the 1 02/26/2019 patient presents today for a follow-up visit here in our clinic he is last been seen in the clinic on October 29. Subsequently we were working on getting MRI to evaluate and see what exactly was going on and where we would need to go from the standpoint of whether or not he had osteomyelitis and again what treatments were going be required. Subsequently the patient ended up  being admitted to the hospital on 02/07/2019 and was discharged on 02/14/2019. This is a somewhat interesting admission with a discharge diagnosis of pneumonia due to COVID-19 although he was positive for COVID-19 when tested  at the urgent care but negative x2 when he was actually in the hospital. With that being said he did have acute respiratory failure with hypoxia and it was noted he also have a left foot ulceration with osteomyelitis. With that being said he did require oxygen for his pneumonia and I level 4 L. He was placed on antivirals and steroids for the COVID-19. He was also transferred to the [facility] at one point. Nonetheless he did subsequently discharged home and since being home has done much better in that regard. The CT angiogram did not show any pulmonary embolism. With regard to the osteomyelitis the patient was placed on vancomycin and Zosyn while in the hospital but has been changed to Augmentin at discharge. It was also recommended that he follow- up with wound care and podiatry. Podiatry however wanted him to see [name] according to the patient prior to them doing anything further. His hemoglobin A1c was 9.9 as noted in the hospital. Have an MRI of the left foot performed while in the hospital on 02/04/2019. 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 There is an overlying plantar open wound noted an abscess tracking back along the lateral aspect of the fifth metatarsal shaft. There is otherwise diffuse cellulitis and mild fasciitis without findings of polymyositis. The patient did have recently pneumonia secondary to COVID-19 I looked in the chart through epic and it does appear that the patient may need to have an additional x-ray just to ensure everything is cleared and that he has no airspace disease prior to putting him into the [name]. 03/05/2019; patient was readmitted to  the clinic last week. He was hospitalized twice for a viral upper respiratory tract infection from 11/1 through 11/4 and then 11/5 through 11/12 ultimately this turned out to be Covid pneumonitis. Although he was discharged on oxygen he is not using it. He says he feels fine. He has no exercise limitation no cough no sputum. His O2 sat in our clinic today was 100% on room air. 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 He received Vanco and Zosyn in the hospital and then was discharged on 2 weeks of Augmentin. I do not see any relevant cultures. He was supposed to follow-up with infectious disease but I do not see that he has an appointment. 12/8; patient saw Dr. Novella Olive of infectious disease last week. He felt that he had had adequate antibiotic therapy. He did not go to follow-up with Dr. Amalia Hailey of podiatry and I have again talked to him about the pros and cons of this. He does not want to consider a ray amputation of this time. He is aware of the risks of recurrence, migration etc. He started HBO today and tolerated this well. He can complete the Augmentin that I gave him last week. I have looked over the lab work that Dr. Chana Bode ordered his C-reactive protein was 3.3 and his sedimentation rate was 17. The C-reactive protein is never really been measurably that high in this patient 12/15; not much change in the wound today however he has undermining along the lateral part of the foot again more extensively than last week. He has some rims of epithelialization. We have been using silver alginate. He is undergoing hyperbarics but did not dive today 12/18; in for his obligatory  first total contact cast change. Unfortunately there was pus coming from the undermining area around his fifth metatarsal head. This was cultured but will preclude reapplication of a cast. He is seen in conjunction  with HBO 12/24; patient had staph lugdunensis in the wound in the undermining area laterally last time. We put him on doxycycline which should have covered this. The wound looks better today. I am going to give him another week of doxycycline before reattempting the total contact cast 12/31; the patient is completing antibiotics. Hemorrhagic debris in the distal part of the wound with some undermining distally. He also had hyper granulation. Extensive debridement with a #5 curette. The infected area that was on the lateral part of the fifth met head is closed over. I do not think he needs any more antibiotics. Patient was seen prior to HBO. Preparations for a total contact cast were made in the cast will be placed post hyperbarics 04/11/19; once again the patient arrives today without complaint. He had been in a cast all week noted that he had heavy drainage this week. This resulted in large raised areas of macerated tissue around the wound 1/14; wound bed looks better slightly smaller. Hydrofera Blue has been changing himself. He had a heavy drainage last week which caused a lot of maceration around the wound so I took him out of a total contact cast he says the drainage is actually better this week He is seen today in conjunction with HBO 1/21; returns to clinic. He was up in [place] for a day or 2 attending a funeral. He comes back in with the wound larger and with a large area of exposed bone. He had osteomyelitis and septic arthritis of the fifth left metatarsal head while he was in hospital. He received IV antibiotics in the hospital for a prolonged period of time then 3 weeks of Augmentin. Subsequently I gave him 2 weeks of doxycycline for more superficial wound infection. When I saw this last week the wound was smaller the surface of the wound looks satisfactory. 1/28; patient missed hyperbarics today. Bone biopsy I did last time showed Enterococcus faecalis and Staphylococcus lugdunensis . He  has a wide area of exposed bone. We are going to use silver alginate as of today. I had another ethical discussion with the patient. 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 In this situation I think the likelihood of healing this is low. Therefore I have recommended a ray amputation and with the patient's agreement I have referred him to Dr. Doran Durand. The other issue is that his compliance with hyperbarics has been minimal because of his work schedule and given his underlying decision I am going to stop this today READMISSION 10/24/2019 MRI 09/29/2019 left foot IMPRESSION: 1. Apparent skin ulceration inferior and lateral to the 5th metatarsal base with underlying heterogeneous T2 signal and enhancement in the subcutaneous fat. Small peripherally enhancing fluid collections along the plantar and lateral aspects of the 5th metatarsal base suspicious for abscesses. 2. Interval amputation through the mid 5th metatarsal with nonspecific low-level marrow edema and enhancement. Given the proximity to the adjacent soft tissue inflammatory changes, osteomyelitis cannot be excluded. 3. The additional bones appear unremarkable. MRI 09/29/2019 right foot IMPRESSION: 1. Soft tissue ulceration lateral to the 5th MTP joint. There is low-level T2 hyperintensity within the 4th and 5th metatarsal heads and adjacent proximal phalanges without abnormal T1 signal or cortical destruction. These findings are nonspecific and could be seen with early  marrow edema, hyperemia or early osteomyelitis. No evidence of septic joint. 2. Mild tenosynovitis and synovial enhancement associated with the extensor digitorum tendons at the level of the midfoot. 3. Diffuse low-level muscular T2 hyperintensity and enhancement, most consistent with diabetic myopathy. LEFT FOOT BONE Methicillin resistant staphylococcus aureus Staphylococcus lugdunensis MIC MIC CIPROFLOXACIN >=8 RESISTANT  Resistant <=0.5 SENSI... Sensitive CLINDAMYCIN <=0.25 SENS... Sensitive >=8 RESISTANT Resistant ERYTHROMYCIN >=8 RESISTANT Resistant >=8 RESISTANT Resistant GENTAMICIN <=0.5 SENSI... Sensitive <=0.5 SENSI... Sensitive Inducible Clindamycin NEGATIVE Sensitive NEGATIVE Sensitive OXACILLIN >=4 RESISTANT Resistant 2 SENSITIVE Sensitive RIFAMPIN <=0.5 SENSI... Sensitive <=0.5 SENSI... Sensitive TETRACYCLINE <=1 SENSITIVE Sensitive <=1 SENSITIVE Sensitive TRIMETH/SULFA <=10 SENSIT Sensitive <=10 SENSIT Sensitive ... Marland Kitchen.. VANCOMYCIN 1 SENSITIVE Sensitive <=0.5 SENSI... Sensitive Right foot bone . Component 3 wk ago Specimen Description BONE Special Requests RIGHT 4 METATARSAL SAMPLE B Gram Stain NO WBC SEEN NO ORGANISMS SEEN Culture RARE METHICILLIN RESISTANT STAPHYLOCOCCUS AUREUS NO ANAEROBES ISOLATED Performed at [facility], [place] [address] [place], [place] [address] Report Status 10/08/2019 FINAL Organism ID, Bacteria METHICILLIN RESISTANT STAPHYLOCOCCUS AUREUS Resulting Agency [facility] Susceptibility Methicillin resistant staphylococcus aureus MIC CIPROFLOXACIN >=8 RESISTANT Resistant CLINDAMYCIN <=0.25 SENS... Sensitive ERYTHROMYCIN >=8 RESISTANT Resistant GENTAMICIN <=0.5 SENSI... Sensitive Inducible Clindamycin NEGATIVE Sensitive OXACILLIN >=4 RESISTANT Resistant RIFAMPIN <=0.5 SENSI... Sensitive TETRACYCLINE <=1 SENSITIVE Sensitive TRIMETH/SULFA <=10 SENSIT Sensitive ... VANCOMYCIN 1 SENSITIVE Sensitive This is a patient we had in clinic earlier this year with a wound over his left fifth metatarsal head. 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 Doran Durand on 05/16/2019. He seems to  have developed problems with wounds on his bilateral feet in June 2021 although he may have had problems earlier than this. He was in an urgent care with a right foot ulcer on 09/26/2019 and given a course of doxycycline. This was apparently after having trouble getting into see orthopedics. He was seen by podiatry on 09/28/2019 noted to have bilateral lower extremity ulcers including the left lateral fifth metatarsal base and the right subfifth met head. It was noted that had purulent drainage at that time. He required hospitalization from 6/20 through 7/2. This was because of worsening right foot wounds. He underwent bilateral operative incision and drainage and bone biopsies bilaterally. Culture results are listed above. He has been referred back to clinic by Dr. Jacqualyn Posey of podiatry. He is also followed by Dr. Megan Salon who saw him yesterday. He was discharged from hospital on Zyvox Flagyl and Levaquin and yesterday changed to doxycycline Flagyl and Levaquin. His inflammatory markers on 6/26 showed a sedimentation rate of 129 and a C-reactive protein of 5. This is improved to 14 and 1.3 respectively. This would indicate improvement. ABIs in our clinic today were 1.23 on the right and 1.20 on the left 11/01/2019 on evaluation today patient appears to be doing fairly well in regard to the wounds on his feet at this point. Fortunately there is no signs of active infection at this time. No fevers, chills, nausea, vomiting, or diarrhea. He currently is seeing infectious disease and still under their care at this point. Subsequently he also has both wounds which she has not been using collagen on as he did not receive that in his packaging he did not call us and  let us know that. Apparently that just was missed on the order. Nonetheless we will get that straightened out today. 8/9-Patient returns for bilateral foot wounds, using Prisma with hydrogel moistened dressings, and the wounds appear stable. Patient  using surgical shoes, avoiding much pressure or weightbearing as much as possible 8/16; patient has bilateral foot wounds. 1 on the right lateral foot proximally the other is on the left mid lateral foot. Both required debridement of callus and thick skin around the wounds. We have been using silver collagen 8/27; patient has bilateral lateral foot wounds. The area on the left substantially surrounded by callus and dry skin. This was removed from the wound edge. The underlying wound is small. The area on the right measured somewhat smaller today. 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 Unfortunately I did not update his antibiotics during today's visit. 9/10 I reviewed Dr. Hale Bogus last notes he felt he had completed antibiotics his inflammatory markers were reasonably well controlled. He has a small wound on the lateral left foot and a tiny area on the right which is just above closed. He is using Hydrofera Blue with border foam he has bilateral surgical shoes 9/24; 2 week f/u. doing well. right foot is closed. left foot still undermined. 10/14; right foot remains closed at the fifth met head. The area over the base of the left fifth metatarsal has a small open area but considerable undermining towards the plantar foot. Thick callus skin around this suggests an adequate pressure relief. We have talked about this. He says he is going to go back into his cam boot. I suggested a total contact cast he did not seem enamored with this suggestion 10/26; left foot base of the fifth metatarsal. Same condition as last time. He has skin over the area with an open wound however the skin is not adherent. He went to see Dr. Earleen Newport who did an x-ray and culture of his foot I have not reviewed the x-ray but the patient was not told anything. He is on doxycycline 11/11; since the patient was last here he was in the emergency room on 10/30 he was  concerned about swelling in the left foot. They did not do any cultures or x-rays. They changed his antibiotics to cephalexin. Previous culture showed group B strep. The cephalexin is appropriate as doxycycline has less than predictable coverage. Arrives in clinic today with swelling over this area under the wound. He also has a new wound on the right fifth metatarsal head 11/18; the patient has a difficult wound on the lateral aspect of the left fifth metatarsal head. The wound was almost ballotable last week I opened it slightly expecting to see purulence however there was just bleeding. I cultured this this was negative. X-ray unchanged. We are trying to get an MRI but I am not sure were going to be able to get this through his insurance. He also has an area on the right lateral fifth metatarsal head this looks healthier 12/3; the patient finally got our MRI. Surprisingly this did not show osteomyelitis. I did show the soft tissue ulceration at the lateral plantar aspect of the fifth metatarsal base with a tiny residual 6 mm abscess overlying the superficial fascia I have tried to culture this area I have not been able to get this to grow anything. Nevertheless the protruding tissue looks aggravated. I suspect we should try to treat the underlying "abscess with broad-spectrum  antibiotics. I am going to start him on Levaquin and Flagyl. He has much less edema in his legs and I am going to continue to wrap his legs and see him weekly 12/10. I started Levaquin and Flagyl on him last week. He just picked up the Flagyl apparently there was some delay. The worry is the wound on the left fifth metatarsal base which is substantial and worsening. His foot looks like he inverts at the ankle making this a weightbearing surface. Certainly no improvement in fact I think the measurements of this are somewhat worse. We have been using 12/17; he apparently just got the Levaquin yesterday this is 2 weeks after the  fact. He has completed the Flagyl. The area over the left fifth metatarsal base still has protruding granulation tissue although it does not look quite as bad as it did some weeks ago. He has severe bilateral lymphedema although we have not been treating him for wounds on his legs this is definitely going to require compression. There was so much edema in the left I did not wish to put him in a total contact cast today. I am going to increase his compression from 3-4 layer. The area on the right lateral fifth met head actually look quite good and superficial. 12/23; patient arrived with callus on the right fifth met head and the substantial hyper granulated callused wound on the base of his fifth metatarsal. He says he is completing his Levaquin in 2 days but I do not think that adds up with what I gave him but I will have to double check this. We are using Hydrofera Blue on both areas. My plan is to put the left leg in a cast the week after New Year's 04/06/2020; patient's wounds about the same. Right lateral fifth metatarsal head and left lateral foot over the base of the fifth metatarsal. There is undermining on the left lateral foot which I removed before application of total contact cast continuing with Hydrofera Blue new. Patient tells me he was seen by endocrinology today lab work was done [Dr. Kerr]. Also wondering whether he was referred to cardiology. I went over some lab work from previously does not have chronic renal failure certainly not nephrotic range proteinuria he does have very poorly controlled diabetes but this is not his most updated lab work. Hemoglobin A1c has been over 11 1/10; the patient had a considerable amount of leakage towards mid part of his left foot with macerated skin however the wound surface looks better the area on the right lateral fifth met head is better as well. I am going to change the dressing on the left foot under the total contact cast to silver alginate,  continue with Hydrofera Blue on the right. 1/20; patient was in the total contact cast for 10 days. Considerable amount of drainage although the skin around the wound does not look too bad on the left foot. The area on the right fifth metatarsal head is closed. Our nursing staff reports large amount of drainage out of the left lateral foot wound 1/25; continues with copious amounts of drainage described by our intake staff. PCR culture I did last week showed E. coli and Enterococcus faecalis and low quantities. Multiple resistance genes documented including extended spectrum beta lactamase, MRSA, MRSE, quinolone, tetracycline. The wound is not quite as good this week as it was 5 days ago but about the same size 2/3; continues with copious amounts of malodorous drainage per our intake nurse.  The PCR culture I did 2 weeks ago showed E. coli and low quantities of Enterococcus. There were multiple resistance genes detected. I put Neosporin on him last week although this does not seem to have helped. The wound is slightly deeper today. Offloading continues to be an issue here although with the amount of drainage she has a total contact cast is just not going to work 2/10; moderate amount of drainage. Patient reports he cannot get his stocking on over the dressing. I told him we have to do that the nurse gave him suggestions on how to make this work. The wound is on the bottom and lateral part of his left foot. Is cultured predominantly grew low amounts of Enterococcus, E. coli and anaerobes. There were multiple resistance genes detected including extended spectrum beta lactamase, quinolone, tetracycline. I could not think of an easy oral combination to address this so for now I am going to do topical antibiotics provided by [facility] I think the main agents here are vancomycin and an aminoglycoside. We have to be able to give him access to the wounds to get the topical antibiotic on 2/17; moderate amount of  drainage this is unchanged. He has his Keystone topical antibiotic against the deep tissue culture organisms. He has been using this and changing the dressing daily. Silver alginate on the wound surface. 2/24; using Keystone antibiotic with silver alginate on the top. He had too much drainage for a total contact cast at one point although I think that is improving and I think in the next week or 2 it might be possible to replace a total contact cast I did not do this today. In general the wound surface looks healthy however he continues to have thick rims of skin and subcutaneous tissue around the wide area of the circumference which I debrided 06/04/2020 upon evaluation today patient appears to be doing well in regard to his wound. I do feel like he is showing signs of improvement. There is little bit of callus and dead tissue around the edges of the wound as well as what appears to be a little bit of a sinus tract that is off to the side laterally I would perform debridement to clear that away today. 3/17; left lateral foot. The wound looks about the same as I remember. Not much depth surface looks healthy. No evidence of infection 3/25; left lateral foot. Wound surface looks about the same. Separating epithelium from the circumference. There really is no evidence of infection here however not making progress by my view 3/29; left lateral foot. Surface of the wound again looks reasonably healthy still thick skin and subcutaneous tissue around the wound margins. There is no evidence of infection. One of the concerns being brought up by the nurses has again the amount of drainage vis--vis continued use of a total contact cast 4/5; left lateral foot at roughly the base of the fifth metatarsal. Nice healthy looking granulated tissue with rims of epithelialization. The overall wound measurements are not any better but the tissue looks healthy. The only concern is the amount of drainage although he has no  surrounding maceration with what we have been doing recently to absorb fluid and protect his skin. He also has lymphedema. 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 He states that the kerlix/coban under the TCC rolled down and could not get it back up. He has been using an offloading scooter and has somehow hurt his right foot using this device. This happened last week. He states that the side of his right foot developed a blister and opened. The top of his foot also has a few small open wounds he thinks is due to his socks rubbing in his shoes. He has not been using any dressings to the wound. He denies purulent drainage, fever/chills or erythema to the wounds. 4/22; patient presents for 1 week follow-up. He developed new wounds to the right foot that were evaluated at last clinic visit. He continues to have a total contact cast to the left leg and he reports no issues. He has been using silver collagen to the right foot wounds with no issues. He denies purulent drainage, fever/chills or erythema to the right foot wounds. He has no complaints today 4/25; patient presents for 1 week follow-up. He has a total contact cast of the left leg and reports no issues. He has been using silver alginate to the right foot wound. He denies purulent drainage, fever/chills or erythema to the right foot wounds. 5/2 patient presents for 1 week follow-up. T contact cast on the left. The wound which is on the base of the plantar foot at the base of the fifth metatarsal otal actually looks quite good and dimensions continue to gradually contract. HOWEVER the area on the right lateral fifth metatarsal head is much larger than what I remember from 2 weeks ago. Once more is he  has significant levels of hypergranulation. Noteworthy that he had this same hyper granulated response on his wound on the left foot at one point in time. So much so that he I thought there was an underlying fluid collection. Based on this I think this just needs debridement. 5/9; the wound on the left actually continues to be gradually smaller with a healthy surface. Slight amount of drainage and maceration of the skin around but not too bad. However he has a large wound over the right fifth metatarsal head very much in the same configuration as his left foot wound was initially. I used silver nitrate to address the hyper granulated tissue no mechanical debridement 5/16; area on the left foot did not look as healthy this week deeper thick surrounding macerated skin and subcutaneous tissue. oo The area on the right foot fifth met head was about the same oo The area on the right ankle that we identified last week is completely broken down into an open wound presumably a stocking rubbing issue 5/23; patient has been using a total contact cast to the left side. He has been using silver alginate underneath. He has also been using silver alginate to the right foot wounds. He has no complaints today. He denies any signs of infection. 5/31; the left-sided wound looks some better measure smaller surface granulation looks better. We have been using silver alginate under the total contact cast oo The large area on his right fifth met head and right dorsal foot look about the same still using silver alginate 6/6; neither side is good as I was hoping although the surface area dimensions are better. A lot of maceration on his left and right foot around the wound edge. Area on the dorsal right foot looks better. He says he was traveling. I am not sure what  does the amount of maceration around the plantar wounds may be drainage issues 6/13; in general the wound surfaces look quite good on both sides. Macerated  skin and raised edges around the wound required debridement although in general especially on the left the surface area seems improved. oo The area on the right dorsal ankle is about the same I thought this would not be such a problem to close 6/20; not much change in either wound although the one on the right looks a little better. Both wounds have thick macerated edges to the skin requiring debridements. We have been using silver alginate. The area on the dorsal right ankle is still open I thought this would be closed. 6/28; patient comes in today with a marked deterioration in the right foot wound fifth met head. Wide area of exposed bone this is a drastic change from last time. The area on the left there we have been casting is stagnant. We have been using silver alginate in both wound areas. 7/5; bone culture I did for PCR last time was positive for Pseudomonas, group B strep, Enterococcus and Staph aureus. There was no suggestion of methicillin resistance or ampicillin resistant genes. This was resistant to tetracycline however He comes into the clinic today with the area over his right plantar fifth metatarsal head which had been doing so well 2 weeks ago completely necrotic feeling bone. I do not know that this is going to be salvageable. The left foot wound is certainly no smaller but it has a better surface and is superficial. 7/8; patient called in this morning to say that his total contact cast was rubbing against his foot. He states he is doing fine overall. He denies signs of infection. 7/12; continued deterioration in the wound over the right fifth metatarsal head crumbling bone. This is not going to be salvageable. The patient agrees and wants to be referred to Dr. Doran Durand which we will attempt to arrange as soon as possible. I am going to continue him on antibiotics as long as that takes so I will renew those today. The area on the left foot which is the base of the fifth metatarsal  continues to look somewhat better. Healthy looking tissue no depth no debridement is necessary here. 7/20; the patient was kindly seen by Dr. Doran Durand of orthopedics on 10/19/2020. 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 Towards this end we have taken him out of the total contact cast on the left we will put him in a wrap with Hydrofera Blue. As I understand things surgery is planned for 7/21 7/27; patient had his surgery last Thursday. 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 Apparently everything went well we did not still disturb that today The area on the left foot actually looks quite good. He has been much less mobile which probably explains this he did not seem to do well in the total contact cast secondary to drainage and maceration I think. 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 Fortunately there is no evidence of active infection at this time. No fevers, chills, nausea, vomiting, or diarrhea. Overall I think that he is actually doing extremely well. Nonetheless I do believe that he is staying off of this more following the surgery in his  right foot that is the reason the left is doing so great. 8/16; left plantar foot wound. This looks smaller than the last time I saw this he is using Hydrofera Blue. The surgical wound on the right foot is being followed by Dr. Doran Durand we did not look at this today. He has surgical shoes on both feet 8/23; left plantar foot wound not as good this week. Surrounding macerated skin and subcutaneous tissue everything looks moist and wet. I do not think he is offloading this adequately. He is using a surgical shoe Apparently the right foot surgical wound is not open although I did not check his foot 8/31; left plantar foot lateral aspect. Much improved this  week. He has no maceration. Some improvement in the surface area of the wound but most impressively the depth is come in we are using silver alginate. The patient is a Product/process development scientist. He is asked that we write him a letter so he can go back to work. I have also tried to see if we can write something that will allow him to limit the amount of time that he is on his foot at work. 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 Hopefully the latter can be avoided 9/6; I believe the patient missed an appointment last week. He arrives in today with a wound looking roughly the same certainly no better. Undermining laterally and also inferiorly. We used molecuLight today in training with the patient's permission.. We are using silver alginate 9/21 wound is measuring bigger this week although this may have to do with the aggressive circumferential debridement last week in response to the blush fluorescence on the MolecuLight. Culture I did last week showed significant MSSA and E. coli. I put him on Augmentin but he has not started it yet. We are also going to send this for compounded antibiotics at [facility]. There is no evidence of systemic infection 9/29; silver alginate. His Keystone arrived. He is completing Augmentin in 2 days. Offloading in a cam boot. Moderate drainage per our intake staff 10/5; using silver alginate. He has been using his [place]. He has completed his Augmentin. Per our intake nurse still a lot of drainage, far too much to consider a total contact cast. Wound measures about the same. He had the same undermining area that I defined last week from a roughly 11-3. I remove this today 10/12; using silver alginate he is using the [place]. He comes in for a nurse visit hence we are applying [employer] twice a week. Measuring slightly better today and less notable drainage. Extensive debridement of the wound  edge last time 10/18; using topical Keystone and silver alginate and a soft cast. Wound measurements about the same. Drainage was through his soft cast. We are changing this twice a week Tuesdays and Friday 10/25; comes in with moderate drainage. Still using Keystone silver alginate and a soft cast. Wound dimensions completely the same.He has a lot of edema in the left leg he has lymphedema. Asking for [name] to consider wrapping him as he cannot get his stocking on over the soft cast 11/2; comes in with moderate to large drainage slightly smaller in terms of width we have been using [place]. His wound looks satisfactory but not much improvement 11/4; patient presents today for obligatory cast change. Has no issues or complaints today. He denies signs of infection. 11/9; patient traveled this weekend to  [place], was on the cast quite a bit. Staining of the cast with black material from his walking boot. Drainage was not quite as bad as we feared. Using silver alginate and Keystone 11/16; we do not have size for cast therefore we have been putting a soft cast on him since the change on Friday. Still a significant amount of drainage necessitating changing twice a week. We have been using the Keystone at cast changes either hard or soft as well as silver alginate Comes in the clinic with things actually looking fairly good improvement in width. He says his offloading is about the same 02/24/2021 upon evaluation today patient actually comes back in and is doing excellent in regard to his foot ulcer this is significantly smaller even compared to the last visit. The soft cast seems to have done extremely well for him which is great news. I do not see any signs of infection minimal debridement will be needed today. 11/30; left lateral foot much improved half a centimeter improvement in surface area. No evidence of infection. He seems to be doing better with the soft cast in the TCC therefore we will continue with  this. He comes back in later in the week for a change with the nurses. This is due to drainage 12/6; no improvement in dimensions. Under illumination some debris on the surface we have been using silver alginate, soft cast. If there is anything optimistic here he seems to have have less drainage 12/13. Dimensions are improved both length and width and slightly in depth. Appears to be quite healthy today. Raised edges of this thick skin and callus around the edges however. He is in a soft cast were bringing him back once for a change on Friday. Drainage is better 12/20. Dimensions are improved. He still has raised edges of thick skin and callus around the edges. We are using a soft cast 12/28; comes in today with thick callus around the wound. Using silver under alginate under a soft cast. I do not think there is much improvement in any measurement 2023 04/06/2021; patient was put in a total contact cast. Unfortunately not much change in surface area 1/10; not much different still thick callus and skin around the edge in spite of the total contact cast. This was just debrided last week we have been using the [facility] compounded antibiotic and silver alginate under a total contact cast 1/18 the patient's wound on the left side is doing nicely. smaller HOWEVER he comes in today with a wound on the right foot laterally. blister most likely serosangquenous drainage 1/24; the patient continues to do well in terms of the plantar left foot which is continued to contract using silver alginate under the total contact cast HOWEVER the right lateral foot is bigger with denuded skin around the edges. I used pickups and a #15 scalpel to remove this this looks like the remanence of a large blister. Cannot rule out infection. Culture in this area I did last week showed Staphylococcus lugdunensis few colonies. I am going to try to address this with his [employer] antibiotic that is done so well on the left having  linezolid and this should cover the staph 2/1; the patient's wound on his left foot which was the original plantar foot wound thick skin and eschar around the edges even in the total contact cast but the wound surface does not look too bad The real problem is on how his right lateral foot at roughly the base of the  fifth metatarsal. The wound is completely necrotic more worrisome than that there is swelling around the edges of this. We have been using silver alginate on both wounds and Keystone on the right foot. Unfortunately I think he is going to require systemic antibiotics while we await cultures. He did not get the x-ray done that we ordered last week [lost the prescription 2/7; disappointingly in the area on the left foot which we are treating with a total contact cast is still not closed although it is much smaller. He continues to have a lot of callus around the wound edge. -Right lateral foot culture I did last week was negative x-ray also negative for osteomyelitis. 2/15: TCC silver alginate on the left and silver alginate on the right lateral. No real improvement in either area 05/26/2021: T oday, the wounds are roughly the same size as at his previous visit, post-debridement. He continues to endorse fairly substantial drainage, particularly on the right. He has been in a total contact cast on the left. There is still some callus surrounding this lesion. On the right, the periwound skin is quite macerated, along with surrounding callus. The center of the right-sided wound also has some dark, densely adherent material, which is very difficult to remove. 06/02/2021: Today, both wounds are slightly smaller. He has been using zinc oxide ointment around the right ulcer and the degree of maceration has improved markedly. There continues to be an area of nonviable tissue in the center of the right sided ulcer. The left-sided wound, which has been in the total contact cast. Appears clean and the  degree of callus around it is less than previously. 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 He had to take the stairs and both wounds have increased in size. The left foot, which has been in a total contact cast, has developed a tunnel tracking to the lateral aspect of his foot. The nonviable tissue in the center of the right-sided ulcer remains recalcitrant to debridement. There is significant undermining surrounding the entirety of the left sided wound. Patient History Information obtained from Patient. Family History No family history of Cancer, Diabetes, Hereditary Spherocytosis, Hypertension, Kidney Disease, Lung Disease, Seizures, Stroke, Thyroid Problems, Tuberculosis. Social History Never smoker, Marital Status - Single, Alcohol Use - Rarely, Drug Use - No History, Caffeine Use - Never. Medical History Eyes Denies history of Cataracts, Glaucoma, Optic Neuritis Ear/Nose/Mouth/Throat Denies history of Chronic sinus problems/congestion, Middle ear problems Hematologic/Lymphatic Denies history of Anemia, Hemophilia, Human Immunodeficiency Virus, Lymphedema, Sickle Cell Disease Respiratory Denies history of Aspiration, Asthma, Chronic Obstructive Pulmonary Disease (COPD), Pneumothorax, Sleep Apnea, Tuberculosis Cardiovascular Denies history of Angina, Arrhythmia, Congestive Heart Failure, Coronary Artery Disease, Deep Vein Thrombosis, Hypertension, Hypotension, Myocardial Infarction, Peripheral Arterial Disease, Peripheral Venous Disease, Phlebitis, Vasculitis Gastrointestinal Denies history of Cirrhosis , Colitis, Crohnoos, Hepatitis A, Hepatitis B, Hepatitis C Endocrine Patient has history of Type II Diabetes Denies history of Type I Diabetes Immunological Denies history of Lupus Erythematosus, Raynaudoos, Scleroderma Integumentary (Skin) Denies history of History of Burn Musculoskeletal Denies history of  Gout, Rheumatoid Arthritis, Osteoarthritis, Osteomyelitis Neurologic Denies history of Dementia, Neuropathy, Quadriplegia, Paraplegia, Seizure Disorder Oncologic Denies history of Received Chemotherapy, Received Radiation Psychiatric Denies history of Anorexia/bulimia, Confinement Anxiety Hospitalization/Surgery History - 11/1-11/06/2018- sepsis foot infection. - 11/4-11/5 02 sats low respiratory distress. Objective Constitutional He is hypertensive. Tachycardic, asymptomatic.Marland Kitchen No acute distress. Vitals Time Taken: 7:54 AM, Height: 77 in, Weight: 280 lbs, BMI: 33.2,  Temperature: 98.1 F, Pulse: 121 bpm, Respiratory Rate: 18 breaths/min, Blood Pressure: 153/98 mmHg, Capillary Blood Glucose: 115 mg/dl. General Notes: glucose per pt report this AM Respiratory Normal work of breathing on room air. General Notes: 06/09/2021: Wound evaluationoocallus present around the right sided ulcer, but less than on previous visits. The center of the right foot wound continues to have nonviable tissue, but it is still very difficult to debride. On the left side, unfortunately this wound has expanded quite a bit. Extensive undermining was present, all of which was removed. I opened the tunnel that tracks to the lateral foot and removed overlying nonviable tissue as well. Integumentary (Hair, Skin) Wound #3 status is Open. Original cause of wound was Trauma. The date acquired was: 10/02/2019. The wound has been in treatment 84 weeks. The wound is located on the Soso. The wound measures 2.2cm length x 3.2cm width x 0.1cm depth; 5.529cm^2 area and 0.553cm^3 volume. There is Fat Layer (Subcutaneous Tissue) exposed. There is no tunneling noted, however, there is undermining starting at 12:00 and ending at 12:00 with a maximum distance of 1.7cm. There is a medium amount of serosanguineous drainage noted. The wound margin is thickened. There is large (67-100%) pink granulation within the wound bed.  There is no necrotic tissue within the wound bed. Wound #8 status is Open. Original cause of wound was Blister. The date acquired was: 04/18/2021. The wound has been in treatment 7 weeks. The wound is located on the Right,Lateral Foot. The wound measures 3.5cm length x 2.7cm width x 0.1cm depth; 7.422cm^2 area and 0.742cm^3 volume. There is Fat Layer (Subcutaneous Tissue) exposed. There is no tunneling or undermining noted. There is a medium amount of serosanguineous drainage noted. The wound margin is well defined and not attached to the wound base. There is medium (34-66%) red, pink granulation within the wound bed. There is a medium (34-66%) amount of necrotic tissue within the wound bed including Adherent Slough. Wound #9 status is Open. Original cause of wound was Shear/Friction. The date acquired was: 06/09/2021. The wound is located on the Left,Lateral Foot. The wound measures 0.5cm length x 0.4cm width x 0.1cm depth; 0.157cm^2 area and 0.016cm^3 volume. There is Fat Layer (Subcutaneous Tissue) exposed. There is no tunneling or undermining noted. There is a medium amount of serosanguineous drainage noted. The wound margin is flat and intact. There is large (67-100%) pink granulation within the wound bed. There is no necrotic tissue within the wound bed. 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 There was a Selective/Open Wound Non-Viable Tissue Debridement with a total area of 9 sq cm performed by [name], MD. With the following instrument(s): Blade, and Forceps to remove Non-Viable  tissue/material. Material removed includes Callus. No specimens were taken. A time out was conducted at 08:15, prior to the start of the procedure. A Moderate amount of bleeding was controlled with Silver Nitrate. The procedure was tolerated well with a pain level of 0 throughout and a pain level of 0 following the procedure. Post Debridement Measurements: 2.2cm length x 3.2cm width x 0.1cm depth; 0.553cm^3 volume. Character of Wound/Ulcer Post Debridement is improved. Severity of Tissue  Post Debridement is: Fat layer exposed. Post procedure Diagnosis Wound #3: Same as Pre-Procedure Pre-procedure diagnosis of Wound #3 is a Diabetic Wound/Ulcer of the Lower Extremity located on the Left,Lateral,Plantar Foot . There was a T Contact otal Cast Procedure by [name], MD. 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 There was a Excisional Skin/Subcutaneous Tissue Debridement with a total area of 9.45 sq cm performed by [name], MD. With the following instrument(s): Blade, Curette, and Forceps to remove Non-Viable tissue/material. Material removed includes Subcutaneous Tissue and Slough and. No specimens were taken. A time out was conducted at 08:15, prior to the start of the procedure. A Moderate amount of bleeding was controlled with Silver Nitrate. The procedure was tolerated well with a pain level of 0 throughout and a pain level of 0 following the procedure. Post Debridement Measurements: 3.5cm length x 2.7cm width x 0.1cm depth; 0.742cm^3 volume. Character of Wound/Ulcer Post Debridement requires further debridement. Severity of Tissue Post Debridement is: Fat layer exposed. Post procedure Diagnosis Wound #8: Same as Pre-Procedure Plan Follow-up Appointments: Return Appointment in 1  week. - Dr. [name] Bathing/ Shower/ Hygiene: May shower with protection but do not get wound dressing(s) wet. - Use Cast Protector Bag Edema Control - Lymphedema / SCD / Other: Elevate legs to the level of the heart or above for 30 minutes daily and/or when sitting, a frequency of: - throughout the day Avoid standing for long periods of time. Exercise regularly Moisturize legs daily. - right leg every night before bed. Compression stocking or Garment 20-30 mm/Hg pressure to: - Apply to right leg in the morning and remove at night. Off-Loading: T Contact Cast to Left Lower Extremity otal Open toe surgical shoe to: - right foot Other: - minimal weight bearing left foot Additional Orders / Instructions: Follow Nutritious Diet WOUND #3: - Foot Wound Laterality: Plantar, Left, Lateral Cleanser: Soap and Water 1 x Per Week/7 Days Discharge Instructions: May shower and wash wound with dial antibacterial soap and water prior to dressing change. Topical: Keystone antibiotic compound 1 x Per Week/7 Days Discharge Instructions: Apply to wound bed Prim Dressing: Hydrofera Blue Classic Foam, 2x2 in 1 x Per Week/7 Days ary Discharge Instructions: Moisten with saline prior to applying to wound bed Secondary Dressing: Woven Gauze Sponge, Non-Sterile 4x4 in 1 x Per Week/7 Days Discharge Instructions: Apply over primary dressing as directed. Secondary Dressing: Optifoam Non-Adhesive Dressing, 4x4 in 1 x Per Week/7 Days Discharge Instructions: Apply over primary dressing as directed. Com pression Wrap: Kerlix Roll 4.5x3.1 (in/yd) 1 x Per Week/7 Days Discharge Instructions: Apply Kerlix and Coban compression as directed. Com pression Wrap: Coban Self-Adherent Wrap 4x5 (in/yd) 1 x Per Week/7 Days Discharge Instructions: Apply over Kerlix as directed. WOUND #8: - Foot Wound Laterality: Right, Lateral Cleanser: Soap and Water Every Other Day/15 Days Discharge Instructions: May shower and wash wound with  dial antibacterial soap and water prior to dressing change. Cleanser: Byram Ancillary Kit - 15 Day Supply (Generic) Every Other Day/15 Days Discharge Instructions: Use supplies as instructed; Kit contains: (15) Saline Bullets; (15) 3x3 Gauze; 15 pr Gloves Peri-Wound Care: Zinc Oxide Ointment 30g tube Every Other Day/15 Days Discharge Instructions: Apply Zinc Oxide to periwound with each dressing change Peri-Wound Care: Sween Lotion (Moisturizing lotion) Every Other Day/15 Days Discharge Instructions: Apply moisturizing lotion  as directed Prim Dressing: Hydrofera Blue Classic Foam, 4x4 in (Generic) Every Other Day/15 Days ary Discharge Instructions: Moisten with saline prior to applying to wound bed Prim Dressing: Santyl Ointment Every Other Day/15 Days ary Discharge Instructions: Apply nickel thick amount to wound bed, cover with Hydrofera Secondary Dressing: Woven Gauze Sponge, Non-Sterile 4x4 in (Generic) Every Other Day/15 Days Discharge Instructions: Apply over primary dressing as directed. Secondary Dressing: Optifoam Non-Adhesive Dressing, 4x4 in (Generic) Every Other Day/15 Days Discharge Instructions: Apply over primary dressing as directed. Secured With: Coban Self-Adherent Wrap 4x5 (in/yd) (Generic) Every Other Day/15 Days Discharge Instructions: Secure with Coban as directed. Secured With: [employer], 4.5x3.1 (in/yd) (Generic) Every Other Day/15 Days Discharge Instructions: Secure with Kerlix as directed. Secured With: 24M Medipore H Soft Cloth Surgical T ape, 4 x 10 (in/yd) (Generic) Every Other Day/15 Days Discharge Instructions: Secure with tape as directed. WOUND #9: - Foot Wound Laterality: Left, Lateral Cleanser: Soap and Water 1 x Per Week/7 Days Discharge Instructions: May shower and wash wound with dial antibacterial soap and water prior to dressing change. Topical: Keystone antibiotic compound 1 x Per Week/7 Days Discharge Instructions: Apply to wound  bed Prim Dressing: Hydrofera Blue Classic Foam, 2x2 in 1 x Per Week/7 Days ary Discharge Instructions: Moisten with saline prior to applying to wound bed Secondary Dressing: Woven Gauze Sponge, Non-Sterile 4x4 in 1 x Per Week/7 Days Discharge Instructions: Apply over primary dressing as directed. Secondary Dressing: Optifoam Non-Adhesive Dressing, 4x4 in 1 x Per Week/7 Days Discharge Instructions: Apply over primary dressing as directed. Com pression Wrap: Kerlix Roll 4.5x3.1 (in/yd) 1 x Per Week/7 Days Discharge Instructions: Apply Kerlix and Coban compression as directed. Com pression Wrap: Coban Self-Adherent Wrap 4x5 (in/yd) 1 x Per Week/7 Days Discharge Instructions: Apply over Kerlix as directed. 06/09/2021: Unfortunately, the patient encountered some circumstances at work that forced him to be on his feet and apply a lot more impact to his wounds. This resulted in expansion of both wounds, despite total contact casting. There is callus present around the right sided ulcer, but less than on previous visits. The center of the right foot wound continues to have nonviable tissue, but it is still very difficult to debride. On the left side, unfortunately this wound has expanded quite a bit. Extensive undermining was present, all of which was removed. I opened the tunnel that tracks to the lateral foot and removed overlying nonviable tissue as well. I think more than a failure of casting or our dressing management, this is a failure of adequate offloading secondary to circumstances beyond the patient's control. We will continue with a total contact cast over Hydrofera Blue on the left and Santyl under Hydrofera Blue on the right. Hopefully, now that the elevator is fixed, we can resume our trajectory towards healing. Follow-up in 1 week. Electronic Signature(s) Signed: 06/09/2021 8:42:06 AM By: [name] MD FACS Entered By: [name] on 06/09/2021  08:42:06 -------------------------------------------------------------------------------- HxROS Details Patient Name: Date of Service: [name], [name] 06/09/2021 7:30 A M Medical Record Number: [id number] Patient Account Number: [id number] Date of Birth/Sex: Treating RN: May 02, 1985 (36 y.o. M) Primary Care Provider: [name] Other Clinician: Referring Provider: Treating Provider/Extender: [name] in Treatment: 1 Information Obtained From Patient Eyes Medical History: Negative for: Cataracts; Glaucoma; Optic Neuritis Ear/Nose/Mouth/Throat Medical History: Negative for: Chronic sinus problems/congestion; Middle ear problems Hematologic/Lymphatic Medical History: Negative for: Anemia; Hemophilia; Human Immunodeficiency Virus; Lymphedema; Sickle Cell Disease Respiratory Medical History: Negative  for: Aspiration; Asthma; Chronic Obstructive Pulmonary Disease (COPD); Pneumothorax; Sleep Apnea; Tuberculosis Cardiovascular Medical History: Negative for: Angina; Arrhythmia; Congestive Heart Failure; Coronary Artery Disease; Deep Vein Thrombosis; Hypertension; Hypotension; Myocardial Infarction; Peripheral Arterial Disease; Peripheral Venous Disease; Phlebitis; Vasculitis Gastrointestinal Medical History: Negative for: Cirrhosis ; Colitis; Crohns; Hepatitis A; Hepatitis B; Hepatitis C Endocrine Medical History: Positive for: Type II Diabetes Negative for: Type I Diabetes Time with diabetes: 8 Treated with: Insulin Blood sugar tested every day: No Immunological Medical History: Negative for: Lupus Erythematosus; Raynauds; Scleroderma Integumentary (Skin) Medical History: Negative for: History of Burn Musculoskeletal Medical History: Negative for: Gout; Rheumatoid Arthritis; Osteoarthritis; Osteomyelitis Neurologic Medical History: Negative for: Dementia; Neuropathy; Quadriplegia; Paraplegia; Seizure Disorder Oncologic Medical  History: Negative for: Received Chemotherapy; Received Radiation Psychiatric Medical History: Negative for: Anorexia/bulimia; Confinement Anxiety Immunizations Pneumococcal Vaccine: Received Pneumococcal Vaccination: No Implantable Devices None Hospitalization / Surgery History Type of Hospitalization/Surgery 11/1-11/06/2018- sepsis foot infection 11/4-11/5 02 sats low respiratory distress Family and Social History Cancer: No; Diabetes: No; Hereditary Spherocytosis: No; Hypertension: No; Kidney Disease: No; Lung Disease: No; Seizures: No; Stroke: No; Thyroid Problems: No; Tuberculosis: No; Never smoker; Marital Status - Single; Alcohol Use: Rarely; Drug Use: No History; Caffeine Use: Never; Financial Concerns: No; Food, Clothing or Shelter Needs: No; Support System Lacking: No; Transportation Concerns: No Physician Affirmation I have reviewed and agree with the above information. Electronic Signature(s) Signed: 06/09/2021 12:45:07 PM By: [name] MD FACS Entered By: [name] on 06/09/2021 08:37:57 -------------------------------------------------------------------------------- Total Contact Cast Details Patient Name: Date of Service: [name] 06/09/2021 7:30 A M Medical Record Number: [id number] Patient Account Number: [id number] Date of Birth/Sex: Treating RN: 07/23/1985 (36 y.o. [name] Primary Care Provider: [name] Other Clinician: Referring Provider: Treating Provider/Extender: [name] in Treatment: 16 T Contact Cast Applied for Wound Assessment: otal Wound #3 Left,Lateral,Plantar Foot Performed By: Physician [name], MD Post Procedure Diagnosis Same as Pre-procedure Electronic Signature(s) Signed: 06/09/2021 12:45:07 PM By: [name] MD FACS Signed: 06/09/2021 6:27:47 PM By: [name] RN, BSN Entered By: [name] on 06/09/2021  08:30:51 -------------------------------------------------------------------------------- [place] Details Patient Name: Date of Service: [name], [name] 06/09/2021 Medical Record Number: [id number] Patient Account Number: [id number] Date of Birth/Sex: Treating RN: 07-Mar-1986 (36 y.o. M) Primary Care Provider: [name] Other Clinician: Referring Provider: Treating Provider/Extender: [name] in Treatment: 84 Diagnosis Coding ICD-10 Codes Code Description E11.621 Type 2 diabetes mellitus with foot ulcer L97.528 Non-pressure chronic ulcer of other part of left foot with other specified severity L97.518 Non-pressure chronic ulcer of other part of right foot with other specified severity Facility Procedures CPT4 Code: 33825053 Description: 97673 - DEB SUBQ TISSUE 20 SQ CM/< ICD-10 Diagnosis Description L97.528 Non-pressure chronic ulcer of other part of left foot with other specified severi Modifier: ty Quantity: 1 CPT4 Code: [id number] Description: 40973 - DEBRIDE WOUND 1ST 20 SQ CM OR < ICD-10 Diagnosis Description L97.518 Non-pressure chronic ulcer of other part of right foot with other specified sever Modifier: ity Quantity: 1 Physician Procedures : CPT4 Code Description Modifier [id number] - WC PHYS SUBQ TISS 20 SQ CM ICD-10 Diagnosis Description L97.528 Non-pressure chronic ulcer of other part of left foot with other specified severity Quantity: 1 : [id number] - WC PHYS DEBR WO ANESTH 20 SQ CM ICD-10 Diagnosis Description L97.518 Non-pressure chronic ulcer of other part of right foot with other specified severity Quantity: 1 Electronic Signature(s) Signed: 06/09/2021 8:42:33 AM By: [name],  Anderson Malta MD FACS Entered By: [name] on 06/09/2021 08:42:31

## 2021-06-09 NOTE — Progress Notes (Signed)
[place], [name] ([id number]) Visit Report for 06/09/2021 Arrival Information Details Patient Name: Date of Service: [name] 06/09/2021 7:30 A M Medical Record Number: [id number] Patient Account Number: [id number] Date of Birth/Sex: Treating RN: Jul 14, 1985 (36 y.o. [name] Primary Care Lyanne Kates: [name] Other Clinician: Referring Jazzmine Kleiman: Treating Roena Sassaman/Extender: [name] in Treatment: 17 Visit Information History Since Last Visit Added or deleted any medications: No Patient Arrived: Ambulatory Any new allergies or adverse reactions: No Arrival Time: 07:54 Had a fall or experienced change in No Accompanied By: alone activities of daily living that may affect Transfer Assistance: None risk of falls: Patient Identification Verified: Yes Signs or symptoms of abuse/neglect since last visito No Secondary Verification Process Completed: Yes Hospitalized since last visit: No Patient Requires Transmission-Based Precautions: No Implantable device outside of the clinic excluding No Patient Has Alerts: No cellular tissue based products placed in the center since last visit: Has Dressing in Place as Prescribed: Yes Has Compression in Place as Prescribed: Yes Pain Present Now: No Electronic Signature(s) Signed: 06/09/2021 6:27:47 PM By: [name] RN, BSN Entered By: [name] on 06/09/2021 07:54:43 -------------------------------------------------------------------------------- Lower Extremity Assessment Details Patient Name: Date of Service: [name], [name] 06/09/2021 7:30 A M Medical Record Number: [id number] Patient Account Number: [id number] Date of Birth/Sex: Treating RN: Jul 01, 1985 (36 y.o. [name] Primary Care Mikella Linsley: [name] Other Clinician: Referring Ashaunte Standley: Treating Pace Lamadrid/Extender: [name] in Treatment: 84 Edema Assessment Assessed: [name]: No] [name]:  No] Edema: [Left: Yes] [Right: Yes] Calf Left: Right: Point of Measurement: 48 cm From Medial Instep 47.7 cm 47 cm Ankle Left: Right: Point of Measurement: 11 cm From Medial Instep 28.9 cm 29 cm Electronic Signature(s) Signed: 06/09/2021 6:27:47 PM By: [name] RN, BSN Entered By: [name] on 06/09/2021 08:00:26 -------------------------------------------------------------------------------- Multi Wound Chart Details Patient Name: Date of Service: [name], [name] 06/09/2021 7:30 A M Medical Record Number: [id number] Patient Account Number: [id number] Date of Birth/Sex: Treating RN: Mar 29, 1986 (36 y.o. M) Primary Care Taegan Standage: [name] Other Clinician: Referring Lamonda Noxon: Treating Dekisha Mesmer/Extender: [name] in Treatment: 6 Vital Signs Height(in): 77 Capillary Blood Glucose(mg/dl): 115 Weight(lbs): 280 Pulse(bpm): 121 Body Mass Index(BMI): 33.2 Blood Pressure(mmHg): 153/98 Temperature(F): 98.1 Respiratory Rate(breaths/min): 18 Photos: Left, Lateral, Plantar Foot Right, Lateral Foot Left, Lateral Foot Wound Location: Trauma Blister Shear/Friction Wounding Event: Diabetic Wound/Ulcer of the Lower Diabetic Wound/Ulcer of the Lower Diabetic Wound/Ulcer of the Lower Primary Etiology: Extremity Extremity Extremity Type II Diabetes Type II Diabetes Type II Diabetes Comorbid History: 10/02/2019 04/18/2021 06/09/2021 Date Acquired: 84 7 0 Weeks of Treatment: Open Open Open Wound Status: No No No Wound Recurrence: 2.2x3.2x0.1 3.5x2.7x0.1 0.5x0.4x0.1 Measurements L x W x [name] 5.529 7.422 0.157 A (cm) : rea 0.553 0.742 0.016 Volume (cm) : -235.30% -1249.50% 0.00% % Reduction in A rea: -235.20% -574.50% 0.00% % Reduction in Volume: 12 Starting Position 1 (o'clock): 12 Ending Position 1 (o'clock): 1.7 Maximum Distance 1 (cm): Yes No No Undermining: Grade 2 Grade 2 Grade 2 Classification: Medium Medium  Medium Exudate A mount: Serosanguineous Serosanguineous Serosanguineous Exudate Type: red, brown red, brown red, brown Exudate Color: Thickened Well defined, not attached Flat and Intact Wound Margin: Large (67-100%) Medium (34-66%) Large (67-100%) Granulation A mount: Pink Red, Pink Pink Granulation Quality: None Present (0%) Medium (34-66%) None Present (0%) Necrotic A mount: Fat Layer (Subcutaneous Tissue): Yes Fat Layer (Subcutaneous Tissue): Yes Fat Layer (Subcutaneous Tissue): Yes  Exposed Structures: Fascia: No Fascia: No Fascia: No Tendon: No Tendon: No Tendon: No Muscle: No Muscle: No Muscle: No Joint: No Joint: No Joint: No Bone: No Bone: No Bone: No Small (1-33%) Small (1-33%) None Epithelialization: Debridement - Selective/Open Wound Debridement - Excisional N/A Debridement: Pre-procedure Verification/Time Out 08:15 08:15 N/A Taken: Callus Subcutaneous, Slough N/A Tissue Debrided: Non-Viable Tissue Skin/Subcutaneous Tissue N/A Level: 9 9.45 N/A Debridement A (sq cm): rea Blade, Forceps Blade, Curette, Forceps N/A Instrument: Moderate Moderate N/A Bleeding: Silver Nitrate Silver Nitrate N/A Hemostasis A chieved: 0 0 N/A Procedural Pain: 0 0 N/A Post Procedural Pain: Procedure was tolerated well Procedure was tolerated well N/A Debridement Treatment Response: Post Debridement Measurements L x 2.2x3.2x0.1 3.5x2.7x0.1 N/A W x [name] 0.553 0.742 N/A Post Debridement Volume: (cm) Debridement Debridement N/A Procedures Performed: T Contact Cast otal Treatment Notes Electronic Signature(s) Signed: 06/09/2021 8:30:58 AM By: [name] MD FACS Entered By: [name] on 06/09/2021 08:30:58 -------------------------------------------------------------------------------- Multi-Disciplinary Care Plan Details Patient Name: Date of Service: [name], [name] 06/09/2021 7:30 A M Medical Record Number: [id number] Patient Account Number:  [id number] Date of Birth/Sex: Treating RN: 1985/05/26 (36 y.o. [name] Primary Care Graycie Halley: [name] Other Clinician: Referring Estelle Skibicki: Treating Weslee Fogg/Extender: [name] in Treatment: 58 Multidisciplinary Care Plan reviewed with physician Active Inactive Nutrition Nursing Diagnoses: Imbalanced nutrition Potential for alteratiion in Nutrition/Potential for imbalanced nutrition Goals: Patient/caregiver agrees to and verbalizes understanding of need to use nutritional supplements and/or vitamins as prescribed Date Initiated: 10/24/2019 Date Inactivated: 04/06/2020 Target Resolution Date: 04/03/2020 Goal Status: Met Patient/caregiver will maintain therapeutic glucose control Date Initiated: 10/24/2019 Target Resolution Date: 07/09/2021 Goal Status: Active Interventions: Assess HgA1c results as ordered upon admission and as needed Assess patient nutrition upon admission and as needed per policy Provide education on elevated blood sugars and impact on wound healing Provide education on nutrition Treatment Activities: Education provided on Nutrition : 12/02/2020 Notes: 11/17/20: Glucose control ongoing issue, target date extended. 01/26/21: Glucose management continues. Wound/Skin Impairment Nursing Diagnoses: Impaired tissue integrity Knowledge deficit related to ulceration/compromised skin integrity Goals: Patient/caregiver will verbalize understanding of skin care regimen Date Initiated: 10/24/2019 Target Resolution Date: 07/09/2021 Goal Status: Active Ulcer/skin breakdown will have a volume reduction of 30% by week 4 Date Initiated: 10/24/2019 Date Inactivated: 01/16/2020 Target Resolution Date: 01/10/2020 Unmet Reason: no change in Goal Status: Unmet measurements. Interventions: Assess patient/caregiver ability to obtain necessary supplies Assess patient/caregiver ability to perform ulcer/skin care regimen upon admission and as  needed Assess ulceration(s) every visit Provide education on ulcer and skin care Notes: 11/17/20: Wound care regimen continues Electronic Signature(s) Signed: 06/09/2021 6:27:47 PM By: [name] RN, BSN Entered By: [name] on 06/09/2021 08:19:51 -------------------------------------------------------------------------------- Pain Assessment Details Patient Name: Date of Service: [name], [name] 06/09/2021 7:30 A M Medical Record Number: [id number] Patient Account Number: [id number] Date of Birth/Sex: Treating RN: 20-Apr-1985 (36 y.o. [name] Primary Care Jalonda Antigua: [name] Other Clinician: Referring Trenita Hulme: Treating Weaver Tweed/Extender: [name] in Treatment: 22 Active Problems Location of Pain Severity and Description of Pain Patient Has Paino No Site Locations Pain Management and Medication Current Pain Management: Electronic Signature(s) Signed: 06/09/2021 6:27:47 PM By: [name] RN, BSN Entered By: [name] on 06/09/2021 07:56:00 -------------------------------------------------------------------------------- Patient/Caregiver Education Details Patient Name: Date of Service: [name], [name] 3/8/2023andnbsp7:30 [place] Record Number: [id number] Patient Account Number: [id number] Date of Birth/Gender: Treating RN: 09-17-1985 (36 y.o. [name]  Primary Care Physician: [name] Other Clinician: Referring Physician: Treating Physician/Extender: [name] in Treatment: 25 Education Assessment Education Provided To: Patient Education Topics Provided Wound/Skin Impairment: Methods: Explain/Verbal Responses: State content correctly [employer]) Signed: 06/09/2021 6:27:47 PM By: [name] RN, BSN Entered By: [name] on 06/09/2021 08:20:22 -------------------------------------------------------------------------------- Wound Assessment  Details Patient Name: Date of Service: [name], [name] 06/09/2021 7:30 A M Medical Record Number: [id number] Patient Account Number: [id number] Date of Birth/Sex: Treating RN: November 15, 1985 (36 y.o. [name] Primary Care Tracie Lindbloom: [name] Other Clinician: Referring Arisa Congleton: Treating Raziah Funnell/Extender: [name] in Treatment: 52 Wound Status Wound Number: 3 Primary Etiology: Diabetic Wound/Ulcer of the Lower Extremity Wound Location: Left, Lateral, Plantar Foot Wound Status: Open Wounding Event: Trauma Comorbid History: Type II Diabetes Date Acquired: 10/02/2019 Weeks Of Treatment: 84 Clustered Wound: No Photos Wound Measurements Length: (cm) 2.2 Width: (cm) 3.2 Depth: (cm) 0.1 Area: (cm) 5.529 Volume: (cm) 0.553 % Reduction in Area: -235.3% % Reduction in Volume: -235.2% Epithelialization: Small (1-33%) Tunneling: No Undermining: Yes Starting Position (o'clock): 12 Ending Position (o'clock): 12 Maximum Distance: (cm) 1.7 Wound Description Classification: Grade 2 Wound Margin: Thickened Exudate Amount: Medium Exudate Type: Serosanguineous Exudate Color: red, brown Wound Bed Granulation Amount: Large (67-100%) Exposed Structure Granulation Quality: Pink Fascia Exposed: No Necrotic Amount: None Present (0%) Fat Layer (Subcutaneous Tissue) Exposed: Yes Tendon Exposed: No Muscle Exposed: No Joint Exposed: No Bone Exposed: No Electronic Signature(s) Signed: 06/09/2021 6:27:47 PM By: [name] RN, BSN Entered By: [name] on 06/09/2021 08:30:24 -------------------------------------------------------------------------------- Wound Assessment Details Patient Name: Date of Service: [name], [name] 06/09/2021 7:30 A M Medical Record Number: [id number] Patient Account Number: [id number] Date of Birth/Sex: Treating RN: 17-Oct-1985 (36 y.o. [name] Primary Care Arti Trang: [name] Other  Clinician: Referring Noeli Lavery: Treating Dorothy Landgrebe/Extender: [name] in Treatment: 67 Wound Status Wound Number: 8 Primary Etiology: Diabetic Wound/Ulcer of the Lower Extremity Wound Location: Right, Lateral Foot Wound Status: Open Wounding Event: Blister Comorbid History: Type II Diabetes Date Acquired: 04/18/2021 Weeks Of Treatment: 7 Clustered Wound: No Photos Wound Measurements Length: (cm) 3.5 Width: (cm) 2.7 Depth: (cm) 0.1 Area: (cm) 7.422 Volume: (cm) 0.742 % Reduction in Area: -1249.5% % Reduction in Volume: -574.5% Epithelialization: Small (1-33%) Tunneling: No Undermining: No Wound Description Classification: Grade 2 Wound Margin: Well defined, not attached Exudate Amount: Medium Exudate Type: Serosanguineous Exudate Color: red, brown Foul Odor After Cleansing: No Slough/Fibrino Yes Wound Bed Granulation Amount: Medium (34-66%) Exposed Structure Granulation Quality: Red, Pink Fascia Exposed: No Necrotic Amount: Medium (34-66%) Fat Layer (Subcutaneous Tissue) Exposed: Yes Necrotic Quality: Adherent Slough Tendon Exposed: No Muscle Exposed: No Joint Exposed: No Bone Exposed: No Electronic Signature(s) Signed: 06/09/2021 6:27:47 PM By: [name] RN, BSN Entered By: [name] on 06/09/2021 08:08:05 -------------------------------------------------------------------------------- Wound Assessment Details Patient Name: Date of Service: [name], [name] 06/09/2021 7:30 A M Medical Record Number: [id number] Patient Account Number: [id number] Date of Birth/Sex: Treating RN: 1985/05/04 (36 y.o. [name] Primary Care Sheamus Hasting: [name] Other Clinician: Referring Nicholl Onstott: Treating Mannat Benedetti/Extender: [name] in Treatment: 12 Wound Status Wound Number: 9 Primary Etiology: Diabetic Wound/Ulcer of the Lower Extremity Wound Location: Left, Lateral Foot Wound Status:  Open Wounding Event: Shear/Friction Comorbid History: Type II Diabetes Date Acquired: 06/09/2021 Weeks Of Treatment: 0 Clustered Wound: No Photos Wound Measurements Length: (cm) 0.5 Width: (cm) 0.4 Depth: (cm) 0.1 Area: (cm) 0.157  Volume: (cm) 0.016 % Reduction in Area: 0% % Reduction in Volume: 0% Epithelialization: None Tunneling: No Undermining: No Wound Description Classification: Grade 2 Wound Margin: Flat and Intact Exudate Amount: Medium Exudate Type: Serosanguineous Exudate Color: red, brown Foul Odor After Cleansing: No Slough/Fibrino No Wound Bed Granulation Amount: Large (67-100%) Exposed Structure Granulation Quality: Pink Fascia Exposed: No Necrotic Amount: None Present (0%) Fat Layer (Subcutaneous Tissue) Exposed: Yes Tendon Exposed: No Muscle Exposed: No Joint Exposed: No Bone Exposed: No Electronic Signature(s) Signed: 06/09/2021 6:27:47 PM By: [name] RN, BSN Entered By: [name] on 06/09/2021 08:07:05 -------------------------------------------------------------------------------- Vitals Details Patient Name: Date of Service: [name], [name] 06/09/2021 7:30 A M Medical Record Number: [id number] Patient Account Number: [id number] Date of Birth/Sex: Treating RN: 07-26-1985 (36 y.o. [name] Primary Care Anelis Hrivnak: [name] Other Clinician: Referring Yvan Dority: Treating Analiya Porco/Extender: [name] in Treatment: 82 Vital Signs Time Taken: 07:54 Temperature (F): 98.1 Height (in): 77 Pulse (bpm): 121 Weight (lbs): 280 Respiratory Rate (breaths/min): 18 Body Mass Index (BMI): 33.2 Blood Pressure (mmHg): 153/98 Capillary Blood Glucose (mg/dl): 115 Reference Range: 80 - 120 mg / dl Notes glucose per pt report this AM Electronic Signature(s) Signed: 06/09/2021 6:27:47 PM By: [name] RN, BSN Entered By: [name] on 06/09/2021 07:55:16

## 2021-06-16 ENCOUNTER — Encounter (HOSPITAL_BASED_OUTPATIENT_CLINIC_OR_DEPARTMENT_OTHER): Payer: BC Managed Care – PPO | Admitting: General Surgery

## 2021-06-16 ENCOUNTER — Other Ambulatory Visit: Payer: Self-pay

## 2021-06-16 DIAGNOSIS — E11621 Type 2 diabetes mellitus with foot ulcer: Secondary | ICD-10-CM | POA: Diagnosis not present

## 2021-06-18 NOTE — Progress Notes (Signed)
[place], [name] ([id number]) ?Visit Report for 06/16/2021 ?Chief Complaint Document Details ?Patient Name: Date of Service: ?[name], [name] 06/16/2021 7:30 A M ?Medical Record Number: [id number] ?Patient Account Number: [id number] ?Date of Birth/Sex: Treating RN: ?November 23, 1985 (36 y.o. M) ?Primary Care Provider: [name] Other Clinician: ?Referring Provider: ?Treating Provider/Extender: [name] ?[name], [name] ?Weeks in Treatment: 18 ?Information Obtained from: Patient ?Chief Complaint ?01/11/2019; patient is here for review of a rather substantial wound over the left fifth plantar metatarsal head extending into the lateral part of his foot ?10/24/2019; patient returns to clinic with wounds on his bilateral feet with underlying osteomyelitis biopsy-proven ?Electronic Signature(s) ?Signed: 06/16/2021 9:50:22 AM By: [name] MD FACS ?Entered By: [name] on 06/16/2021 09:50:21 ?-------------------------------------------------------------------------------- ?Debridement Details ?Patient Name: Date of Service: ?[name], [name] 06/16/2021 7:30 A M ?Medical Record Number: [id number] ?Patient Account Number: [id number] ?Date of Birth/Sex: Treating RN: ?03-21-1986 (36 y.o. [name], [name] ?Primary Care Provider: [name] Other Clinician: ?Referring Provider: ?Treating Provider/Extender: [name] ?[name], [name] ?Weeks in Treatment: 85 ?Debridement Performed for Assessment: Wound #8 Right,Lateral Foot ?Performed By: Physician [name], MD ?Debridement Type: Debridement ?Severity of Tissue Pre Debridement: Fat layer exposed ?Level of Consciousness (Pre-procedure): Awake and Alert ?Pre-procedure Verification/Time Out Yes - 08:45 ?Taken: ?Start Time: 08:45 ?Pain Control: Lidocaine ?T Area Debrided (L x W): ?otal 2.5 (cm) x 2.5 (cm) = 6.25 (cm?) ?Tissue and other material debrided: ?Viable, Non-Viable, Callus, Eschar, Slough, Subcutaneous, Skin: Dermis , Skin: Epidermis,  Slough ?Level: Skin/Subcutaneous Tissue ?Debridement Description: Excisional ?Instrument: Curette ?Bleeding: Minimum ?Hemostasis Achieved: Pressure ?End Time: 08:45 ?Procedural Pain: 0 ?Post Procedural Pain: 0 ?Response to Treatment: Procedure was tolerated well ?Level of Consciousness (Post- Awake and Alert ?procedure): ?Post Debridement Measurements of Total Wound ?Length: (cm) 2.5 ?Width: (cm) 2.5 ?Depth: (cm) 1 ?Volume: (cm?) 4.909 ?Character of Wound/Ulcer Post Debridement: Improved ?Severity of Tissue Post Debridement: Fat layer exposed ?Post Procedure Diagnosis ?Same as Pre-procedure ?Electronic Signature(s) ?Signed: 06/16/2021 6:42:44 PM By: [name] MD FACS ?Signed: 06/18/2021 12:57:05 PM By: Rhae Hammock RN ?Entered By: Rhae Hammock on 06/16/2021 08:45:27 ?-------------------------------------------------------------------------------- ?Debridement Details ?Patient Name: ?Date of Service: ?[name], [name] 06/16/2021 7:30 A M ?Medical Record Number: [id number] ?Patient Account Number: [id number] ?Date of Birth/Sex: ?Treating RN: ?25-Oct-1985 (36 y.o. [name], [name] ?Primary Care Provider: [name] ?Other Clinician: ?Referring Provider: ?Treating Provider/Extender: [name] ?[name], [name] ?Weeks in Treatment: 85 ?Debridement Performed for Assessment: Wound #3 Left,Lateral,Plantar Foot ?Performed By: Physician [name], MD ?Debridement Type: Debridement ?Severity of Tissue Pre Debridement: Fat layer exposed ?Level of Consciousness (Pre-procedure): Awake and Alert ?Pre-procedure Verification/Time Out Yes - 08:45 ?Taken: ?Start Time: 08:45 ?Pain Control: Lidocaine ?T Area Debrided (L x W): ?otal 2.3 (cm) x 2.6 (cm) = 5.98 (cm?) ?Tissue and other material debrided: ?Viable, Non-Viable, Callus, Slough, Skin: Dermis , Skin: Epidermis, Slough ?Level: Skin/Epidermis ?Debridement Description: Selective/Open Wound ?Instrument: Curette ?Bleeding: Minimum ?Hemostasis Achieved:  Pressure ?End Time: 08:45 ?Procedural Pain: 0 ?Post Procedural Pain: 0 ?Response to Treatment: Procedure was tolerated well ?Level of Consciousness (Post- Awake and Alert ?procedure): ?Post Debridement Measurements of Total Wound ?Length: (cm) 2.3 ?Width: (cm) 2.6 ?Depth: (cm) 0.2 ?Volume: (cm?) 0.939 ?Character of Wound/Ulcer Post Debridement: Improved ?Severity of Tissue Post Debridement: Fat layer exposed ?Post Procedure Diagnosis ?Same as Pre-procedure ?Electronic Signature(s) ?Signed: 06/16/2021 6:42:44 PM By: [name] MD FACS ?Signed: 06/18/2021 12:57:05 PM By: Rhae Hammock RN ?Entered By: Rhae Hammock on 06/16/2021 08:47:04 ?-------------------------------------------------------------------------------- ?HPI Details ?Patient Name: Date  of Service: ?[name], [name] 06/16/2021 7:30 A M ?Medical Record Number: [id number] ?Patient Account Number: [id number] ?Date of Birth/Sex: Treating RN: ?1985/08/29 (36 y.o. 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 He comes in for review of a wound over the plantar fifth metatarsal head ?extending into the lateral part of the foot. He was followed for this previously by his podiatrist Dr. Cornelius Moras. As the patient tells his story he went to see podiatry ?first for a swelling he developed on the lateral part of his fifth metatarsal head in May. He states this was "open" by podiatry and the area closed. He was ?followed up in June and it was again opened callus removed and it closed promptly. There were plans being made for surgery on the fifth metatarsal head in ?June however his blood sugar was apparently too high for anesthesia. Apparently the area was debrided and opened again in June and it  is never closed since. ?Looking over the records from podiatry I am really not able to follow this. It was clear when he was first seen it was before 5/14 at that point he already had a ?wound. By 5/17 the ulcer was resolved. I do not see anything about a procedure. On 5/28 noted to have pre-ulcerative moderate keratosis. X-ray noted 1/5 ?contracted toe and tailor's bunion and metatarsal deformity. On a visit date on 09/28/2018 the dorsal part of the left foot it healed and resolved. There was ?concern about swelling in his lower extremity he was sent to the ER.. As far as I can tell he was seen in the ER on 7/12 with an ulcer on his left foot. A DVT ?rule out of the left leg was negative. I do not think I have complete records from podiatry but I am not able to verify the procedures this patient states he had. ?He states after the last procedure the wound has never closed although I am not able to follow this in the records I have from podiatry. He has not had a ?recent x-ray ?The patient has been using Neosporin on the wound. He is wearing a Darco shoe. He is still very active up on his foot working and exercising. ?Past medical history; type 2 diabetes ketosis-prone, leg swelling with a negative DVT study in July. Non-smoker ?ABI in our clinic was 0.85 on the left ?10/16; substantial wound on the plantar left fifth met head extending laterally almost to the dorsal fifth MTP. We have been using silver alginate we gave him a ?Darco forefoot off loader. An x-ray did not show evidence of osteomyelitis did note soft tissue emphysema which I think was due to gas tracking through an ?open wound. There is no doubt in my mind he requires an MRI ?10/23; MRI not booked until 3 November at the earliest this is largely due to his glucose sensor in the right arm. We have been using silver alginate. There has ?been an improvement ?10/29; I am still not exactly sure when his MRI is booked for. He says it is the third but it is  the 10th in epic. This definitely needs to be done. He is running a ?low-grade fever today but no other symptoms. No real improvement in the 1 ?02/26/2019 patient presents today for a follow-up visit here  in

## 2021-06-18 NOTE — Progress Notes (Signed)
[place], [name] ([id number]) ?Visit Report for 06/16/2021 ?Arrival Information Details ?Patient Name: Date of Service: ?[name], [name] 06/16/2021 7:30 A M ?Medical Record Number: [id number] ?Patient Account Number: [id number] ?Date of Birth/Sex: Treating RN: ?[birth year] (37 y.o. [name], [name] ?Primary Care Kandi Brusseau: [name] Other Clinician: ?Referring Berdie Malter: ?Treating Kathyjo Briere/Extender: [name] ?[name], [name] ?Weeks in Treatment: 83 ?Visit Information History Since Last Visit ?Added or deleted any medications: No ?Patient Arrived: Ambulatory ?Any new allergies or adverse reactions: No ?Arrival Time: 07:54 ?Had a fall or experienced change in No ?Accompanied By: self ?activities of daily living that may affect ?Transfer Assistance: None ?risk of falls: ?Patient Identification Verified: Yes ?Signs or symptoms of abuse/neglect since last visito No ?Secondary Verification Process Completed: Yes ?Hospitalized since last visit: No ?Patient Requires Transmission-Based Precautions: No ?Implantable device outside of the clinic excluding No ?Patient Has Alerts: No ?cellular tissue based products placed in the center ?since last visit: ?Has Dressing in Place as Prescribed: Yes ?Has Footwear/Offloading in Place as Prescribed: Yes ?Pain Present Now: No ?Electronic Signature(s) ?Signed: 06/18/2021 12:57:05 PM By: Rhae Hammock RN ?Entered By: Rhae Hammock on 06/16/2021 07:57:36 ?-------------------------------------------------------------------------------- ?Encounter Discharge Information Details ?Patient Name: Date of Service: ?[name], [name] 06/16/2021 7:30 A M ?Medical Record Number: [id number] ?Patient Account Number: [id number] ?Date of Birth/Sex: Treating RN: ?Aug 14, 1985 (36 y.o. [name], [name] ?Primary Care Gottlieb Zuercher: [name] Other Clinician: ?Referring Shakeeta Godette: ?Treating Gina Leblond/Extender: [name] ?[name], [name] ?Weeks in Treatment: 49 ?Encounter Discharge  Information Items Post Procedure Vitals ?Discharge Condition: Stable ?Temperature (F): 98.7 ?Ambulatory Status: Ambulatory ?Pulse (bpm): 74 ?Discharge Destination: Home ?Respiratory Rate (breaths/min): 17 ?Transportation: Private Auto ?Blood Pressure (mmHg): 147/74 ?Accompanied By: self ?Schedule Follow-up Appointment: Yes ?Clinical Summary of Care: Patient Declined ?Electronic Signature(s) ?Signed: 06/18/2021 12:57:05 PM By: Rhae Hammock RN ?Entered By: Rhae Hammock on 06/16/2021 10:32:47 ?-------------------------------------------------------------------------------- ?Lower Extremity Assessment Details ?Patient Name: ?Date of Service: ?[name], [name] 06/16/2021 7:30 A M ?Medical Record Number: [id number] ?Patient Account Number: [id number] ?Date of Birth/Sex: ?Treating RN: ?Jun 27, 1985 (36 y.o. [name], [name] ?Primary Care Earnest Thalman: [name] ?Other Clinician: ?Referring Etha Stambaugh: ?Treating Izak Anding/Extender: [name] ?[name], [name] ?Weeks in Treatment: 85 ?Edema Assessment ?Assessed: [Left: Yes] [Right: Yes] ?Edema: [Left: Yes] [Right: Yes] ?Calf ?Left: Right: ?Point of Measurement: 48 cm From Medial Instep 47.7 cm 47 cm ?Ankle ?Left: Right: ?Point of Measurement: 11 cm From Medial Instep 28.9 cm 29 cm ?Vascular Assessment ?Pulses: ?Dorsalis Pedis ?Palpable: [Left:Yes] [Right:Yes] ?Posterior Tibial ?Palpable: [Left:Yes] [Right:Yes] ?Electronic Signature(s) ?Signed: 06/18/2021 12:57:05 PM By: Rhae Hammock RN ?Entered By: Rhae Hammock on 06/16/2021 08:36:52 ?-------------------------------------------------------------------------------- ?Multi Wound Chart Details ?Patient Name: ?Date of Service: ?[name], [name] 06/16/2021 7:30 A M ?Medical Record Number: [id number] ?Patient Account Number: [id number] ?Date of Birth/Sex: ?Treating RN: ?Mar 30, 1986 (36 y.o. M) ?Primary Care Quanta Roher: [name] ?Other Clinician: ?Referring Christyann Manolis: ?Treating Jace Dowe/Extender: [name] ?[name], [name] ?Weeks in Treatment: 79 ?Vital Signs ?Height(in): 77 ?Capillary Blood Glucose(mg/dl): 140 ?Weight(lbs): 280 ?Pulse(bpm): 96 ?Body Mass Index(BMI): 33.2 ?Blood Pressure(mmHg): 152/97 ?Temperature(??F): 98.6 ?Respiratory Rate(breaths/min): 17 ?Photos: [3:Left, Lateral, Plantar Foot] [8:Right, Lateral Foot] [9:No Photos Left, Lateral Foot] ?Wound Location: [3:Trauma] [8:Blister] [9:Shear/Friction] ?Wounding Event: [3:Diabetic Wound/Ulcer of the Lower] [8:Diabetic Wound/Ulcer of the Lower] [9:Diabetic Wound/Ulcer of the Lower] ?Primary Etiology: [3:Extremity Type II Diabetes] [8:Extremity Type II Diabetes] [9:Extremity N/A] ?Comorbid History: [3:10/02/2019] [8:04/18/2021] [9:06/09/2021] ?Date Acquired: [3:85] [8:8] [9:1] ?Weeks of Treatment: [3:Open] [8:Open] [9:Converted] ?Wound Status: [3:No] [8:No] [9:No] ?Wound Recurrence: [3:2.3x2.6x0.2] [8:2.5x2.5x1] [9:0x0x0] ?  Measurements L x W x D (cm) [3:4.697] [8:4.909] [9:0] ?A (cm?) : ?rea [3:0.939] [8:4.909] [9:0] ?Volume (cm?) : [3:-184.80%] [8:-792.50%] [9:100.00%] ?% Reduction in A [3:rea: -469.10%] [8:-4362.70%] [9:100.00%] ?% Reduction in Volume: [3:Grade 2] [8:Grade 2] [9:Grade 2] ?Classification: [3:Medium] [8:Medium] [9:Medium] ?Exudate A mount: [3:Serosanguineous] [8:Serosanguineous] [9:Serosanguineous] ?Exudate Type: [3:red, brown] [8:red, brown] [9:red, brown] ?Exudate Color: [3:Thickened] [8:Well defined, not attached] [9:N/A] ?Wound Margin: [3:Large (67-100%)] [8:Medium (34-66%)] [9:N/A] ?Granulation A mount: [3:Pink] [8:Red, Pink] [9:N/A] ?Granulation Quality: [3:None Present (0%)] [8:Medium (34-66%)] [9:N/A] ?Necrotic A mount: ?[3:Fat Layer (Subcutaneous Tissue): Yes Fat Layer (Subcutaneous Tissue): Yes N/A] ?Exposed Structures: ?[3:Fascia: No Tendon: No Muscle: No Joint: No Bone: No Small (1-33%)] [8:Fascia: No Tendon: No Muscle: No Joint: No Bone: No Small (1-33%)] [9:N/A] ?Epithelialization: [3:Debridement - Selective/Open Wound  Debridement - Excisional] [9:N/A] ?Debridement: ?Pre-procedure Verification/Time Out 08:45 [8:08:45] [9:N/A] ?Taken: [3:Lidocaine] [8:Lidocaine] [9:N/A] ?Pain Control: [3:Callus, Slough] [8:Necrotic/Eschar, Callus,] [9:N/A] ?Tissue Debrided: [3:Skin/Epidermis] [8:Subcutaneous, Slough Skin/Subcutaneous Tissue] [9:N/A] ?Level: [3:5.98] [8:6.25] [9:N/A] ?Debridement A (sq cm): [3:rea Curette] [8:Curette] [9:N/A] ?Instrument: [3:Minimum] [8:Minimum] [9:N/A] ?Bleeding: [3:Pressure] [8:Pressure] [9:N/A] ?Hemostasis Achieved: [3:0] [8:0] [9:N/A] ?Procedural Pain: [3:0] [8:0] [9:N/A] ?Post Procedural Pain: [3:Procedure was tolerated well] [8:Procedure was tolerated well] [9:N/A] ?Debridement Treatment Response: [3:2.3x2.6x0.2] [8:2.5x2.5x1] [9:N/A] ?Post Debridement Measurements L x ?W x D (cm) [3:0.939] [8:4.909] [9:N/A] ?Post Debridement Volume: (cm?) [3:Debridement] [8:Debridement] [9:N/A] ?Procedures Performed: [3:T Contact Cast otal] ?Treatment Notes ?Electronic Signature(s) ?Signed: 06/16/2021 9:49:41 AM By: Cannon, Jennifer MD FACS ?Entered By: Cannon, Jennifer on 06/16/2021 09:49:41 ?-------------------------------------------------------------------------------- ?Multi-Disciplinary Care Plan Details ?Patient Name: ?Date of Service: ?[name], [name] 06/16/2021 7:30 A M ?Medical Record Number: [id number] ?Patient Account Number: [id number] ?Date of Birth/Sex: ?Treating RN: ?09/24/1985 (35 y.o. M) Breedlove, [name] ?Primary Care Provider: [name], Ronald ?Other Clinician: ?Referring Provider: ?Treating Provider/Extender: Cannon, Jennifer ?[name], Ronald ?Weeks in Treatment: 85 ?Multidisciplinary Care Plan reviewed with physician ?Active Inactive ?Nutrition ?Nursing Diagnoses: ?Imbalanced nutrition ?Potential for alteratiion in Nutrition/Potential for imbalanced nutrition ?Goals: ?Patient/caregiver agrees to and verbalizes understanding of need to use nutritional supplements and/or vitamins as prescribed ?Date Initiated:  10/24/2019 ?Date Inactivated: 04/06/2020 ?Target Resolution Date: 04/03/2020 ?Goal Status: Met ?Patient/caregiver will maintain therapeutic glucose control ?Date Initiated: 10/24/2019 ?Target Resolution Date: 07/09/2021 ?Goal

## 2021-06-23 ENCOUNTER — Encounter (HOSPITAL_BASED_OUTPATIENT_CLINIC_OR_DEPARTMENT_OTHER): Payer: BC Managed Care – PPO | Admitting: General Surgery

## 2021-06-23 ENCOUNTER — Other Ambulatory Visit: Payer: Self-pay

## 2021-06-23 DIAGNOSIS — E11621 Type 2 diabetes mellitus with foot ulcer: Secondary | ICD-10-CM | POA: Diagnosis not present

## 2021-06-23 NOTE — Progress Notes (Signed)
[place]af[place], [name] ([id number]) ?Visit Report for 06/23/2021 ?Arrival Information Details ?Patient Name: Date of Service: ?[name], [name] 06/23/2021 7:45 A M ?Medical Record Number: [id number] ?Patient Account Number: [id number] ?Date of Birth/Sex: Treating RN: ?07/20/1985 (35 y.o. Jerilynn Mages) Dellie Catholic ?Primary Care Chequita Mofield: [name] Other Clinician: ?Referring Sai Moura: ?Treating Vernell Back/Extender: [name] ?[name], [name] ?Weeks in Treatment: 58 ?Visit Information History Since Last Visit ?Added or deleted any medications: No ?Patient Arrived: Ambulatory ?Any new allergies or adverse reactions: No ?Arrival Time: 08:19 ?Had a fall or experienced change in No ?Accompanied By: sig other ?activities of daily living that may affect ?Transfer Assistance: None ?risk of falls: ?Patient Identification Verified: Yes ?Signs or symptoms of abuse/neglect since last visito No ?Patient Requires Transmission-Based Precautions: No ?Hospitalized since last visit: No ?Patient Has Alerts: No ?Implantable device outside of the clinic excluding No ?cellular tissue based products placed in the center ?since last visit: ?Has Dressing in Place as Prescribed: Yes ?Pain Present Now: No ?Electronic Signature(s) ?Signed: 06/23/2021 5:30:22 PM By: Dellie Catholic RN ?Entered By: Dellie Catholic on 06/23/2021 08:20:18 ?-------------------------------------------------------------------------------- ?Compression Therapy Details ?Patient Name: Date of Service: ?[name], [name] 06/23/2021 7:45 A M ?Medical Record Number: [id number] ?Patient Account Number: [id number] ?Date of Birth/Sex: Treating RN: ?08-14-1985 (36 y.o. Jerilynn Mages) Dellie Catholic ?Primary Care Clarann Helvey: [name] Other Clinician: ?Referring Tersa Fotopoulos: ?Treating Rainy Rothman/Extender: [name] ?[name], [name] ?Weeks in Treatment: 18 ?Compression Therapy Performed for Wound Assessment: Wound #8 Right,Lateral Foot ?Performed By: Clinician Dellie Catholic, RN ?Compression Type:  Three Layer ?Post Procedure Diagnosis ?Same as Pre-procedure ?Electronic Signature(s) ?Signed: 06/23/2021 8:53:13 AM By: Dellie Catholic RN ?Entered By: Dellie Catholic on 06/23/2021 08:53:13 ?-------------------------------------------------------------------------------- ?Compression Therapy Details ?Patient Name: ?Date of Service: ?[name], [name] 06/23/2021 7:45 A M ?Medical Record Number: [id number] ?Patient Account Number: [id number] ?Date of Birth/Sex: ?Treating RN: ?1985/12/16 (36 y.o. Jerilynn Mages) Dellie Catholic ?Primary Care Abigaile Rossie: [name] ?Other Clinician: ?Referring Bekah Igoe: ?Treating Braydon Kullman/Extender: [name] ?[name], [name] ?Weeks in Treatment: 22 ?Compression Therapy Performed for Wound Assessment: Wound #10 Right,Anterior Foot ?Performed By: Clinician Dellie Catholic, RN ?Compression Type: Three Layer ?Post Procedure Diagnosis ?Same as Pre-procedure ?Electronic Signature(s) ?Signed: 06/23/2021 8:53:47 AM By: Dellie Catholic RN ?Entered By: Dellie Catholic on 06/23/2021 08:53:47 ?-------------------------------------------------------------------------------- ?Encounter Discharge Information Details ?Patient Name: ?Date of Service: ?[name], [name] 06/23/2021 7:45 A M ?Medical Record Number: [id number] ?Patient Account Number: [id number] ?Date of Birth/Sex: ?Treating RN: ?03/25/1986 (36 y.o. Jerilynn Mages) Dellie Catholic ?Primary Care Batu Cassin: [name] ?Other Clinician: ?Referring Joyceann Kruser: ?Treating Diany Formosa/Extender: [name] ?[name], [name] ?Weeks in Treatment: 30 ?Encounter Discharge Information Items Post Procedure Vitals ?Discharge Condition: Stable ?Temperature (F): 98.5 ?Ambulatory Status: Ambulatory ?Pulse (bpm): 98 ?Discharge Destination: Home ?Respiratory Rate (breaths/min): 16 ?Transportation: Private Auto ?Blood Pressure (mmHg): 145/82 ?Accompanied By: Bonney Leitz other ?Schedule Follow-up Appointment: Yes ?Clinical Summary of Care: Patient Declined ?Electronic Signature(s) ?Signed:  06/23/2021 5:30:22 PM By: Dellie Catholic RN ?Entered By: Dellie Catholic on 06/23/2021 09:51:47 ?-------------------------------------------------------------------------------- ?Lower Extremity Assessment Details ?Patient Name: ?Date of Service: ?[name], [name] 06/23/2021 7:45 A M ?Medical Record Number: [id number] ?Patient Account Number: [id number] ?Date of Birth/Sex: ?Treating RN: ?[birth year] (36 y.o. Jerilynn Mages) Dellie Catholic ?Primary Care Everlina Gotts: [name] ?Other Clinician: ?Referring Kaylib Furness: ?Treating Dietrich Ke/Extender: [name] ?[name], [name] ?Weeks in Treatment: 36 ?Edema Assessment ?Assessed: [Left: No] [Right: No] ?Edema: [Left: Yes] [Right: Yes] ?Calf ?Left: Right: ?Point of Measurement: 48 cm From Medial Instep 48.5 cm 53.2 cm ?Ankle ?Left: Right: ?Point of Measurement: 11 cm From  Medial Instep 30 cm 32 cm ?Knee To Floor ?Left: Right: ?From Medial Instep 57 cm 57 cm ?Electronic Signature(s) ?Signed: 06/23/2021 5:30:22 PM By: Dellie Catholic RN ?Entered By: Dellie Catholic on 06/23/2021 08:25:32 ?-------------------------------------------------------------------------------- ?Multi Wound Chart Details ?Patient Name: ?Date of Service: ?[name], [name] 06/23/2021 7:45 A M ?Medical Record Number: [id number] ?Patient Account Number: [id number] ?Date of Birth/Sex: ?Treating RN: ?27-Oct-1985 (36 y.o. 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 ?Primary  Etiology: ?Extremity Extremity Extremity ?Type II Diabetes Type II Diabetes Type II Diabetes ?Comorbid History: ?06/19/2021 10/02/2019 04/18/2021 ?Date Acquired: ?0 86 9 ?Weeks of Treatment: ?Open Open Open ?Wound Status: ?No No No ?Wound Recurrence: ?1.3x1x0.1 2.1x2.3x0.2 3.5x2.7x0.2 ?Measurements L x W x D (cm) ?1.021 3.793 7.422 ?A (cm?) : ?rea ?0.102 0.759 1.484 ?Volume (cm?) : ?0.00% -130.00% -1249.50% ?% Reduction in A rea: ?0.00% -360.00% -1249.10% ?% Reduction in Volume: ?Grade 1 Grade 2 Grade 2 ?Classification: ?Medium Medium Medium ?Exudate A mount: ?Serosanguineous Serosanguineous Serosanguineous ?Exudate Type: ?red, brown red, brown red, brown ?Exudate Color: ?N/A Thickened Well defined, not attached ?Wound Margin: ?Large (67-100%) Large (67-100%) Medium (34-66%) ?Granulation A mount: ?Red Pink Pink ?Granulation Quality: ?Small (1-33%) None Present (0%) Medium (34-66%) ?Necrotic A mount: ?Wolfe ?Necrotic Tissue: ?Fat Layer (Subcutaneous Tissue): Yes Fat Layer (Subcutaneous Tissue): Yes Fat Layer (Subcutaneous Tissue): Yes ?Exposed Structures: ?Fascia: No ?Fascia: No ?Fascia: No ?Tendon: No ?Tendon: No ?Tendon: No ?Muscle: No ?Muscle: No ?Muscle: No ?Joint: No ?Joint: No ?Joint: No ?Bone: No ?Bone: No ?Bone: No ?Small (1-33%) Small (1-33%) Small (1-33%) ?Epithelialization: ?Debridement - Excisional Debridement - Excisional Debridement - Excisional ?Debridement: ?Pre-procedure Verification/Time Out 08:44 08:44 08:44 ?Taken: ?Other Other Other ?Pain Control: ?Subcutaneous, Slough Subcutaneous, [employer], [place] ?Tissue Debrided: ?Skin/Subcutaneous Tissue Skin/Subcutaneous Tissue Skin/Subcutaneous Tissue ?Level: ?1.3 4.83 9.45 ?Debridement A (sq cm): ?rea ?Curette Curette Curette ?Instrument: ?Minimum Minimum Minimum ?Bleeding: ?Pressure Pressure Pressure ?Hemostasis A chieved: ?0 0 0 ?Procedural Pain: ?0 0 0 ?Post Procedural Pain: ?Procedure was tolerated well  Procedure was tolerated well Procedure was tolerated well ?Debridement Treatment Response: ?1.3x1x0.1 2.1x2.3x0.2 3.5x2.7x0.2 ?Post Debridement Measurements L x ?W x D (cm) ?0.102 0.759 1.484 ?Post Debridemen

## 2021-06-23 NOTE — Progress Notes (Signed)
[place], [name] ([id number]) ?Visit Report for 06/23/2021 ?Chief Complaint Document Details ?Patient Name: Date of Service: ?[name], [name] 06/23/2021 7:45 A M ?Medical Record Number: [id number] ?Patient Account Number: [id number] ?Date of Birth/Sex: Treating RN: ?[birth year] (36 y.o. M) ?Primary Care Provider: [name] Other Clinician: ?Referring Provider: ?Treating Provider/Extender: [name] ?[name], [name] ?Weeks in Treatment: 13 ?Information Obtained from: Patient ?Chief Complaint ?01/11/2019; patient is here for review of a rather substantial wound over the left fifth plantar metatarsal head extending into the lateral part of his foot ?10/24/2019; patient returns to clinic with wounds on his bilateral feet with underlying osteomyelitis biopsy-proven ?Electronic Signature(s) ?Signed: 06/23/2021 8:51:59 AM By: [name] MD FACS ?Entered By: [name] on 06/23/2021 08:51:59 ?-------------------------------------------------------------------------------- ?Debridement Details ?Patient Name: Date of Service: ?[name], [name] 06/23/2021 7:45 A M ?Medical Record Number: [id number] ?Patient Account Number: [id number] ?Date of Birth/Sex: Treating RN: ?May 22, 1985 (36 y.o. Jerilynn Mages) Dellie Catholic ?Primary Care Provider: [name] Other Clinician: ?Referring Provider: ?Treating Provider/Extender: [name] ?[name], [name] ?Weeks in Treatment: 35 ?Debridement Performed for Assessment: Wound #8 Right,Lateral Foot ?Performed By: Physician [name], MD ?Debridement Type: Debridement ?Severity of Tissue Pre Debridement: Fat layer exposed ?Level of Consciousness (Pre-procedure): Awake and Alert ?Pre-procedure Verification/Time Out Yes - 08:44 ?Taken: ?Start Time: 08:44 ?Pain Control: ?Other : Benzocaine ?T Area Debrided (L x W): ?otal 3.5 (cm) x 2.7 (cm) = 9.45 (cm?) ?Tissue and other material debrided: Non-Viable, Slough, Subcutaneous, [place] ?Level: Skin/Subcutaneous Tissue ?Debridement  Description: Excisional ?Instrument: Curette ?Specimen: Tissue Culture ?Number of Specimens T aken: 1 ?Bleeding: Minimum ?Hemostasis Achieved: Pressure ?End Time: 08:46 ?Procedural Pain: 0 ?Post Procedural Pain: 0 ?Response to Treatment: Procedure was tolerated well ?Level of Consciousness (Post- Awake and Alert ?procedure): ?Post Debridement Measurements of Total Wound ?Length: (cm) 3.5 ?Width: (cm) 2.7 ?Depth: (cm) 0.2 ?Volume: (cm?) 1.484 ?Character of Wound/Ulcer Post Debridement: Improved ?Severity of Tissue Post Debridement: Fat layer exposed ?Post Procedure Diagnosis ?Same as Pre-procedure ?Electronic Signature(s) ?Signed: 06/23/2021 3:04:13 PM By: [name] MD FACS ?Signed: 06/23/2021 5:30:22 PM By: Dellie Catholic RN ?Entered By: Dellie Catholic on 06/23/2021 08:50:10 ?-------------------------------------------------------------------------------- ?Debridement Details ?Patient Name: ?Date of Service: ?[name], [name] 06/23/2021 7:45 A M ?Medical Record Number: [id number] ?Patient Account Number: [id number] ?Date of Birth/Sex: ?Treating RN: ?28-Nov-1985 (36 y.o. Jerilynn Mages) Dellie Catholic ?Primary Care Provider: [name] ?Other Clinician: ?Referring Provider: ?Treating Provider/Extender: [name] ?[name], [name] ?Weeks in Treatment: 71 ?Debridement Performed for Assessment: Wound #3 Left,Lateral,Plantar Foot ?Performed By: Physician [name], MD ?Debridement Type: Debridement ?Severity of Tissue Pre Debridement: Fat layer exposed ?Level of Consciousness (Pre-procedure): Awake and Alert ?Pre-procedure Verification/Time Out Yes - 08:44 ?Taken: ?Start Time: 08:44 ?Pain Control: ?Other : Benzocaine ?T Area Debrided (L x W): ?otal 2.1 (cm) x 2.3 (cm) = 4.83 (cm?) ?Tissue and other material debrided: Non-Viable, Slough, Subcutaneous, [place] ?Level: Skin/Subcutaneous Tissue ?Debridement Description: Excisional ?Instrument: Curette ?Bleeding: Minimum ?Hemostasis Achieved: Pressure ?End Time:  08:46 ?Procedural Pain: 0 ?Post Procedural Pain: 0 ?Response to Treatment: Procedure was tolerated well ?Level of Consciousness (Post- Awake and Alert ?procedure): ?Post Debridement Measurements of Total Wound ?Length: (cm) 2.1 ?Width: (cm) 2.3 ?Depth: (cm) 0.2 ?Volume: (cm?) 0.759 ?Character of Wound/Ulcer Post Debridement: Improved ?Severity of Tissue Post Debridement: Fat layer exposed ?Post Procedure Diagnosis ?Same as Pre-procedure ?Electronic Signature(s) ?Signed: 06/23/2021 3:04:13 PM By: [name] MD FACS ?Signed: 06/23/2021 5:30:22 PM By: Dellie Catholic RN ?Entered By: Dellie Catholic on 06/23/2021 08:51:39 ?-------------------------------------------------------------------------------- ?Debridement Details ?Patient Name: ?Date of  Service: ?[name], [name] 06/23/2021 7:45 A M ?Medical Record Number: [id number] ?Patient Account Number: [id number] ?Date of Birth/Sex: ?Treating RN: ?08/25/1985 (36 y.o. Jerilynn Mages) Dellie Catholic ?Primary Care Provider: [name] ?Other Clinician: ?Referring Provider: ?Treating Provider/Extender: [name] ?[name], [name] ?Weeks in Treatment: 28 ?Debridement Performed for Assessment: Wound #10 Right,Anterior Foot ?Performed By: Physician [name], MD ?Debridement Type: Debridement ?Severity of Tissue Pre Debridement: Fat layer exposed ?Level of Consciousness (Pre-procedure): Awake and Alert ?Pre-procedure Verification/Time Out Yes - 08:44 ?Taken: ?Start Time: 08:44 ?Pain Control: ?Other : Benzocaine ?T Area Debrided (L x W): ?otal 1.3 (cm) x 1 (cm) = 1.3 (cm?) ?Tissue and other material debrided: Non-Viable, Slough, Subcutaneous, [place] ?Level: Skin/Subcutaneous Tissue ?Debridement Description: Excisional ?Instrument: Curette ?Bleeding: Minimum ?Hemostasis Achieved: Pressure ?End Time: 08:46 ?Procedural Pain: 0 ?Post Procedural Pain: 0 ?Response to Treatment: Procedure was tolerated well ?Level of Consciousness (Post- Awake and Alert ?procedure): ?Post  Debridement Measurements of Total Wound ?Length: (cm) 1.3 ?Width: (cm) 1 ?Depth: (cm) 0.1 ?Volume: (cm?) 0.102 ?Character of Wound/Ulcer Post Debridement: Improved ?Severity of Tissue Post Debridement: Fat layer exposed ?Post Procedure Diagnosis ?Same as Pre-procedure ?Electronic Signature(s) ?Signed: 06/23/2021 3:04:13 PM By: [name] MD FACS ?Signed: 06/23/2021 5:30:22 PM By: Dellie Catholic RN ?Entered By: Dellie Catholic on 06/23/2021 08:52:32 ?-------------------------------------------------------------------------------- ?HPI Details ?Patient Name: ?Date of Service: ?[name], [name] 06/23/2021 7:45 A M ?Medical Record Number: [id number] ?Patient Account Number: [id number] ?Date of Birth/Sex: ?Treating RN: ?1985-08-28 (36 y.o. 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 He comes in for review of a wound over the plantar fifth metatarsal head ?extending into the lateral part of the foot. He was followed for this previously by his podiatrist Dr. Cornelius Moras. As the patient tells his story he went to see podiatry ?first for a swelling he developed on the lateral part of his fifth metatarsal head in May. He states this was "open" by podiatry and the area closed. He was ?followed up in June and it was again opened callus removed and it closed promptly. There were plans being made for surgery on the fifth metatarsal head in ?June however his blood sugar was apparently too high for anesthesia. Apparently the area was debrided and opened again in June and it is never closed since. ?Looking over the records from podiatry I am really not able to follow this. It was clear when he was first seen it was before 5/14 at that point he already had a ?wound.  By 5/17 the ulcer was resolved. I do not see anything about a procedure. On 5/28 noted to have pre-ulcerative moderate keratosis. X-ray noted 1/5 ?contracted toe and tailor's bunion and metatarsal deformity. On a vi

## 2021-06-30 ENCOUNTER — Encounter (HOSPITAL_BASED_OUTPATIENT_CLINIC_OR_DEPARTMENT_OTHER): Payer: BC Managed Care – PPO | Admitting: General Surgery

## 2021-06-30 ENCOUNTER — Other Ambulatory Visit: Payer: Self-pay

## 2021-06-30 DIAGNOSIS — E11621 Type 2 diabetes mellitus with foot ulcer: Secondary | ICD-10-CM | POA: Diagnosis not present

## 2021-06-30 NOTE — Progress Notes (Signed)
[name], [name] ([id number]) ?Visit Report for 06/30/2021 ?Arrival Information Details ?Patient Name: Date of Service: ?[name], [name] 06/30/2021 7:30 A M ?Medical Record Number: [id number] ?Patient Account Number: [id number] ?Date of Birth/Sex: Treating RN: ?May 21, 1985 (36 y.o. [name] ?Primary Care Caton Popowski: [name] Other Clinician: ?Referring Janki Dike: ?Treating Klayton Monie/Extender: [name] ?[name], [name] ?Weeks in Treatment: 41 ?Visit Information History Since Last Visit ?Added or deleted any medications: No ?Patient Arrived: Ambulatory ?Any new allergies or adverse reactions: No ?Arrival Time: 07:48 ?Had a fall or experienced change in No ?Accompanied By: self ?activities of daily living that may affect ?Transfer Assistance: None ?risk of falls: ?Patient Identification Verified: Yes ?Signs or symptoms of abuse/neglect since last visito No ?Secondary Verification Process Completed: Yes ?Hospitalized since last visit: No ?Patient Requires Transmission-Based Precautions: No ?Implantable device outside of the clinic excluding No ?Patient Has Alerts: No ?cellular tissue based products placed in the center ?since last visit: ?Has Dressing in Place as Prescribed: Yes ?Has Compression in Place as Prescribed: Yes ?Has Footwear/Offloading in Place as Prescribed: Yes ?Left: T Contact Cast ?otal ?Pain Present Now: No ?Electronic Signature(s) ?Signed: 06/30/2021 9:27:56 AM By: Baruch Gouty RN, BSN ?Entered By: Baruch Gouty on 06/30/2021 09:10:55 ?-------------------------------------------------------------------------------- ?Compression Therapy Details ?Patient Name: Date of Service: ?[name], [name] 06/30/2021 7:30 A M ?Medical Record Number: [id number] ?Patient Account Number: [id number] ?Date of Birth/Sex: Treating RN: ?Apr 10, 1985 (36 y.o. [name] ?Primary Care Lytle Malburg: [name] Other Clinician: ?Referring Uchechi Denison: ?Treating Verdon Ferrante/Extender: [name] ?[name],  [name] ?Weeks in Treatment: 8 ?Compression Therapy Performed for Wound Assessment: Wound #10 Right,Anterior Foot ?Performed By: Clinician Baruch Gouty, RN ?Compression Type: Three Layer ?Post Procedure Diagnosis ?Same as Pre-procedure ?Electronic Signature(s) ?Signed: 06/30/2021 9:27:56 AM By: Baruch Gouty RN, BSN ?Entered By: Baruch Gouty on 06/30/2021 09:17:47 ?-------------------------------------------------------------------------------- ?Encounter Discharge Information Details ?Patient Name: ?Date of Service: ?[name], [name] 06/30/2021 7:30 A M ?Medical Record Number: [id number] ?Patient Account Number: [id number] ?Date of Birth/Sex: ?Treating RN: ?02-Jul-1985 (36 y.o. [name] ?Primary Care Junita Kubota: [name] ?Other Clinician: ?Referring Daleen Steinhaus: ?Treating Peggie Hornak/Extender: [name] ?[name], [name] ?Weeks in Treatment: 73 ?Encounter Discharge Information Items Post Procedure Vitals ?Discharge Condition: Stable ?Temperature (F): 97.9 ?Ambulatory Status: Ambulatory ?Pulse (bpm): 96 ?Discharge Destination: Home ?Respiratory Rate (breaths/min): 18 ?Transportation: Private Auto ?Blood Pressure (mmHg): 139/86 ?Accompanied By: self ?Schedule Follow-up Appointment: Yes ?Clinical Summary of Care: Patient Declined ?Electronic Signature(s) ?Signed: 06/30/2021 9:27:56 AM By: Baruch Gouty RN, BSN ?Entered By: Baruch Gouty on 06/30/2021 09:27:01 ?-------------------------------------------------------------------------------- ?Lower Extremity Assessment Details ?Patient Name: ?Date of Service: ?[name], [name] 06/30/2021 7:30 A M ?Medical Record Number: [id number] ?Patient Account Number: [id number] ?Date of Birth/Sex: ?Treating RN: ?April 21, 1985 (36 y.o. [name] ?Primary Care Latoiya Maradiaga: [name] ?Other Clinician: ?Referring Puneet Masoner: ?Treating Sidharth Leverette/Extender: [name] ?[name], [name] ?Weeks in Treatment: 72 ?Edema Assessment ?Assessed: [Left: No] [Right:  No] ?Edema: [Left: Yes] [Right: Yes] ?Calf ?Left: Right: ?Point of Measurement: 48 cm From Medial Instep 47 cm 48.5 cm ?Ankle ?Left: Right: ?Point of Measurement: 11 cm From Medial Instep 28.8 cm 30 cm ?Vascular Assessment ?Pulses: ?Dorsalis Pedis ?Palpable: [Left:Yes] [Right:Yes] ?Electronic Signature(s) ?Signed: 06/30/2021 9:27:56 AM By: Baruch Gouty RN, BSN ?Entered By: Baruch Gouty on 06/30/2021 08:09:18 ?-------------------------------------------------------------------------------- ?Multi Wound Chart Details ?Patient Name: ?Date of Service: ?[name], [name] 06/30/2021 7:30 A M ?Medical Record Number: [id number] ?Patient Account Number: [id number] ?Date of Birth/Sex: ?Treating RN: ?[birth year] (36 y.o. [name] ?Primary Care Uel Davidow: [name],  [name] ?Other Clinician: ?Referring Rosemary Pentecost: ?Treating Banks Chaikin/Extender: [name] ?[name], [name] ?Weeks in Treatment: 70 ?Vital Signs ?Height(in): 77 ?Capillary Blood Glucose(mg/dl): 123 ?Weight(lbs): 280 ?Pulse(bpm): 96 ?Body Mass Index(BMI): 33.2 ?Blood Pressure(mmHg): 139/86 ?Temperature(??F): 97.9 ?Respiratory Rate(breaths/min): 18 ?Photos: ?Right, Anterior Foot Left, Lateral, Plantar Foot Right, Lateral Foot ?Wound Location: ?Gradually Appeared Trauma Blister ?Wounding Event: ?Diabetic Wound/Ulcer of the Lower Diabetic Wound/Ulcer of the Lower Diabetic Wound/Ulcer of the Lower ?Primary Etiology: ?Extremity Extremity Extremity ?Type II Diabetes Type II Diabetes Type II Diabetes ?Comorbid History: ?06/19/2021 10/02/2019 04/18/2021 ?Date Acquired: ?1 87 10 ?Weeks of Treatment: ?Open Open Open ?Wound Status: ?No No No ?Wound Recurrence: ?1.4x1x0.1 2.7x2.3x0.2 4x2.7x1.1 ?Measurements L x W x D (cm) ?1.1 4.877 8.482 ?A (cm?) : ?rea ?0.11 0.975 9.331 ?Volume (cm?) : ?-7.70% -195.80% -1442.20% ?% Reduction in A rea: ?-7.80% -490.90% -8382.70% ?% Reduction in Volume: ?9 12 ?Starting Position 1 (o'clock): ?12 12 ?Ending Position 1 (o'clock): ?0.5  0.6 ?Maximum Distance 1 (cm): ?No Yes Yes ?Undermining: ?Grade 1 Grade 2 Grade 2 ?Classification: ?Medium Medium Large ?Exudate A mount: ?Serosanguineous Serosanguineous Serosanguineous ?Exudate Type: ?red, brown red, brown red, brown ?Exudate Color: ?Flat and Intact Thickened Well defined, not attached ?Wound Margin: ?Medium (34-66%) Large (67-100%) Medium (34-66%) ?Granulation A mount: ?Red Red, Pink Pink ?Granulation Quality: ?Medium (34-66%) None Present (0%) Medium (34-66%) ?Necrotic A mount: ?Fat Layer (Subcutaneous Tissue): Yes Fat Layer (Subcutaneous Tissue): Yes Fat Layer (Subcutaneous Tissue): Yes ?Exposed Structures: ?Fascia: No ?Fascia: No ?Fascia: No ?Tendon: No ?Tendon: No ?Tendon: No ?Muscle: No ?Muscle: No ?Muscle: No ?Joint: No ?Joint: No ?Joint: No ?Bone: No ?Bone: No ?Bone: No ?Small (1-33%) Small (1-33%) None ?Epithelialization: ?N/A Debridement - Excisional Debridement - Excisional ?Debridement: ?Pre-procedure Verification/Time Out N/A 08:10 08:10 ?Taken: ?N/A Subcutaneous, Slough Fat, Subcutaneous, Slough ?Tissue Debrided: ?N/A Skin/Subcutaneous Tissue Skin/Subcutaneous Tissue ?Level: ?N/A 6.21 12.9 ?Debridement A (sq cm): ?rea ?N/A Curette Curette ?Instrument: ?N/A Minimum Minimum ?Bleeding: ?N/A Pressure Pressure ?Hemostasis A chieved: ?N/A 0 0 ?Procedural Pain: ?N/A 0 0 ?Post Procedural Pain: ?N/A Procedure was tolerated well Procedure was tolerated well ?Debridement Treatment Response: ?N/A 2.7x2.3x0.1 4.3x3x1.1 ?Post Debridement Measurements L x ?W x D (cm) ?N/A 0.488 11.145 ?Post Debridement Volume: (cm?) ?N/A N/A macerated wound margins ?Assessment Notes: ?N/A Debridement Debridement ?Procedures Performed: ?Treatment Notes ?Electronic Signature(s) ?Signed: 06/30/2021 8:43:23 AM By: [name] MD FACS ?Signed: 06/30/2021 9:27:56 AM By: Baruch Gouty RN, BSN ?Entered By: [name] on 06/30/2021  08:43:23 ?-------------------------------------------------------------------------------- ?Multi-Disciplinary Care Plan Details ?Patient Name: ?Date of Service: ?[name], [name] 06/30/2021 7:30 A M ?Medical Record Number: [id number] ?Patient Account Number: [id number] ?Date of Birth/Sex: ?Treating

## 2021-06-30 NOTE — Progress Notes (Signed)
[place], [name] ([id number]) ?Visit Report for 06/30/2021 ?Chief Complaint Document Details ?Patient Name: Date of Service: ?[name], [name] 06/30/2021 7:30 A M ?Medical Record Number: [id number] ?Patient Account Number: [id number] ?Date of Birth/Sex: Treating RN: ?Jun 24, 1985 (36 y.o. [name] ?Primary Care Provider: [name] Other Clinician: ?Referring Provider: ?Treating Provider/Extender: [name] ?[name], [name] ?Weeks in Treatment: 22 ?Information Obtained from: Patient ?Chief Complaint ?01/11/2019; patient is here for review of a rather substantial wound over the left fifth plantar metatarsal head extending into the lateral part of his foot ?10/24/2019; patient returns to clinic with wounds on his bilateral feet with underlying osteomyelitis biopsy-proven ?Electronic Signature(s) ?Signed: 06/30/2021 8:43:37 AM By: [name] MD FACS ?Entered By: [name] on 06/30/2021 08:43:37 ?-------------------------------------------------------------------------------- ?Debridement Details ?Patient Name: Date of Service: ?[name], [name] 06/30/2021 7:30 A M ?Medical Record Number: [id number] ?Patient Account Number: [id number] ?Date of Birth/Sex: Treating RN: ?1985/05/10 (36 y.o. [name] ?Primary Care Provider: [name] Other Clinician: ?Referring Provider: ?Treating Provider/Extender: [name] ?[name], [name] ?Weeks in Treatment: 23 ?Debridement Performed for Assessment: Wound #8 Right,Lateral Foot ?Performed By: Physician [name], MD ?Debridement Type: Debridement ?Severity of Tissue Pre Debridement: Fat layer exposed ?Level of Consciousness (Pre-procedure): Awake and Alert ?Pre-procedure Verification/Time Out Yes - 08:10 ?Taken: ?Start Time: 08:11 ?T Area Debrided (L x W): ?otal 4.3 (cm) x 3 (cm) = 12.9 (cm?) ?Tissue and other material debrided: Viable, Non-Viable, Fat, Slough, Subcutaneous, Slough ?Level: Skin/Subcutaneous Tissue ?Debridement  Description: Excisional ?Instrument: Curette ?Bleeding: Minimum ?Hemostasis Achieved: Pressure ?Procedural Pain: 0 ?Post Procedural Pain: 0 ?Response to Treatment: Procedure was tolerated well ?Level of Consciousness (Post- Awake and Alert ?procedure): ?Post Debridement Measurements of Total Wound ?Length: (cm) 4.3 ?Width: (cm) 3 ?Depth: (cm) 1.1 ?Volume: (cm?) 11.145 ?Character of Wound/Ulcer Post Debridement: Improved ?Severity of Tissue Post Debridement: Fat layer exposed ?Post Procedure Diagnosis ?Same as Pre-procedure ?Electronic Signature(s) ?Signed: 06/30/2021 9:27:56 AM By: Baruch Gouty RN, BSN ?Signed: 06/30/2021 12:03:30 PM By: [name] MD FACS ?Entered By: Baruch Gouty on 06/30/2021 08:19:28 ?-------------------------------------------------------------------------------- ?Debridement Details ?Patient Name: ?Date of Service: ?[name], [name] 06/30/2021 7:30 A M ?Medical Record Number: [id number] ?Patient Account Number: [id number] ?Date of Birth/Sex: ?Treating RN: ?Aug 01, 1985 (36 y.o. [name] ?Primary Care Provider: [name] ?Other Clinician: ?Referring Provider: ?Treating Provider/Extender: [name] ?[name], [name] ?Weeks in Treatment: 63 ?Debridement Performed for Assessment: Wound #3 Left,Lateral,Plantar Foot ?Performed By: Physician [name], MD ?Debridement Type: Debridement ?Severity of Tissue Pre Debridement: Fat layer exposed ?Level of Consciousness (Pre-procedure): Awake and Alert ?Pre-procedure Verification/Time Out Yes - 08:10 ?Taken: ?Start Time: 08:11 ?T Area Debrided (L x W): ?otal 2.7 (cm) x 2.3 (cm) = 6.21 (cm?) ?Tissue and other material debrided: Viable, Non-Viable, Slough, Subcutaneous, Slough ?Level: Skin/Subcutaneous Tissue ?Debridement Description: Excisional ?Instrument: Curette ?Bleeding: Minimum ?Hemostasis Achieved: Pressure ?Procedural Pain: 0 ?Post Procedural Pain: 0 ?Response to Treatment: Procedure was tolerated well ?Level of  Consciousness (Post- Awake and Alert ?procedure): ?Post Debridement Measurements of Total Wound ?Length: (cm) 2.7 ?Width: (cm) 2.3 ?Depth: (cm) 0.1 ?Volume: (cm?) 0.488 ?Character of Wound/Ulcer Post Debridement: Improved ?Severity of Tissue Post Debridement: Fat layer exposed ?Post Procedure Diagnosis ?Same as Pre-procedure ?Electronic Signature(s) ?Signed: 06/30/2021 9:27:56 AM By: Baruch Gouty RN, BSN ?Signed: 06/30/2021 12:03:30 PM By: [name] MD FACS ?Entered By: Baruch Gouty on 06/30/2021 08:20:13 ?-------------------------------------------------------------------------------- ?HPI Details ?Patient Name: ?Date of Service: ?[name], [name] 06/30/2021 7:30 A M ?Medical Record Number: [id number] ?Patient Account Number: [id number] ?  Date of Birth/Sex: Treating RN: ?12/29/1985 (36 y.o. 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 He comes in for review of a wound over the plantar fifth metatarsal head ?extending into the lateral part of the foot. He was followed for this previously by his podiatrist Dr. Cornelius Moras. As the patient tells his story he went to see podiatry ?first for a swelling he developed on the lateral part of his fifth metatarsal head in May. He states this was "open" by podiatry and the area closed. He was ?followed up in June and it was again opened callus removed and it closed promptly. There were plans being made for surgery on the fifth metatarsal head in ?June however his blood sugar was apparently too high for anesthesia. Apparently the area was debrided and opened again in June and it is never closed since. ?Looking over the records from podiatry I am really not able to follow this. It was clear  when he was first seen it was before 5/14 at that point he already had a ?wound. By 5/17 the ulcer was resolved. I do not see anything about a procedure. On 5/28 noted to have pre-ulcerative moderate keratosis. X-ray noted 1/5 ?contracted toe and tailor's bunion and metatarsal deformity. On a visit date on 09/28/2018 the dorsal part of the left foot it healed and resolved. There was ?concern about swelling in his lower extremity he was sent to the ER.. As far as I can tell he was seen in the ER on 7/12 with an ulcer on his left foot. A DVT ?rule out of the left leg was negative. I do not think I have complete records from podiatry but I am not able to verify the procedures this patient states he had. ?He states after the last procedure the wound has never closed although I am not able to follow this in the records I have from podiatry. He has not had a ?recent x-ray ?The patient has been using Neosporin on the wound. He is wearing a Darco shoe. He is still very active up on his foot working and exercising. ?Past medical history; type 2 diabetes ketosis-prone, leg swelling with a negative DVT study in July. Non-smoker ?ABI in our clinic was 0.85 on the left ?10/16; substantial wound on the plantar left fifth met head extending laterally almost to the dorsal fifth MTP. We have been using silver alginate we gave him a ?Darco forefoot off loader. An x-ray did not show evidence of osteomyelitis did note soft tissue emphysema which I think was due to gas tracking through an ?open wound. There is no doubt in my mind he requires an MRI ?10/23; MRI not booked until 3 November at the earliest this is largely due to his glucose sensor in the right arm. We have been using silver alginate. There has ?been an improvement ?10/29; I am still not exactly sure when his MRI is booked for. He says it is the third but it is the 10th in epic. This definitely needs to be done. He is running a ?low-grade fever today but no other symptoms.  No real improvement in the 1 ?02/26/2019 patient presents today for a follow-up visit here in our clinic he is last been seen in the clinic on October 29. Subsequently we were  working on ?getting MRI to evaluate and se

## 2021-07-07 ENCOUNTER — Encounter (HOSPITAL_BASED_OUTPATIENT_CLINIC_OR_DEPARTMENT_OTHER): Payer: BC Managed Care – PPO | Attending: General Surgery | Admitting: General Surgery

## 2021-07-07 DIAGNOSIS — S91312A Laceration without foreign body, left foot, initial encounter: Secondary | ICD-10-CM | POA: Insufficient documentation

## 2021-07-07 DIAGNOSIS — L97528 Non-pressure chronic ulcer of other part of left foot with other specified severity: Secondary | ICD-10-CM | POA: Diagnosis not present

## 2021-07-07 DIAGNOSIS — X58XXXA Exposure to other specified factors, initial encounter: Secondary | ICD-10-CM | POA: Insufficient documentation

## 2021-07-07 DIAGNOSIS — E11621 Type 2 diabetes mellitus with foot ulcer: Secondary | ICD-10-CM | POA: Insufficient documentation

## 2021-07-07 DIAGNOSIS — L97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: Secondary | ICD-10-CM | POA: Diagnosis not present

## 2021-07-07 DIAGNOSIS — L97518 Non-pressure chronic ulcer of other part of right foot with other specified severity: Secondary | ICD-10-CM | POA: Diagnosis not present

## 2021-07-07 NOTE — Progress Notes (Signed)
[place], [name] ([id number]) ?Visit Report for 07/07/2021 ?Arrival Information Details ?Patient Name: Date of Service: ?[name], [name] 07/07/2021 7:30 A M ?Medical Record Number: [id number] ?Patient Account Number: [id number] ?Date of Birth/Sex: Treating RN: ?01/07/1986 (36 y.o. [name] ?Primary Care Dayven Linsley: [name] Other Clinician: ?Referring Florabel Faulks: ?Treating Freeda Spivey/Extender: [name] ?[name], [name] ?Weeks in Treatment: 65 ?Visit Information History Since Last Visit ?Added or deleted any medications: No ?Patient Arrived: Ambulatory ?Any new allergies or adverse reactions: No ?Arrival Time: 07:53 ?Had a fall or experienced change in No ?Accompanied By: self ?activities of daily living that may affect ?Transfer Assistance: None ?risk of falls: ?Patient Identification Verified: Yes ?Signs or symptoms of abuse/neglect since last visito No ?Secondary Verification Process Completed: Yes ?Hospitalized since last visit: No ?Patient Requires Transmission-Based Precautions: No ?Implantable device outside of the clinic excluding No ?Patient Has Alerts: No ?cellular tissue based products placed in the center ?since last visit: ?Has Dressing in Place as Prescribed: Yes ?Has Compression in Place as Prescribed: No ?Has Footwear/Offloading in Place as Prescribed: Yes ?Left: T Contact Cast ?otal ?Pain Present Now: No ?Notes ?pt removed compression wrap from right leg secondary to drainage. states has been wearing compression stocking over dressing ?Electronic Signature(s) ?Signed: 07/07/2021 6:23:41 PM By: Baruch Gouty RN, BSN ?Entered By: Baruch Gouty on 07/07/2021 07:55:33 ?-------------------------------------------------------------------------------- ?Encounter Discharge Information Details ?Patient Name: Date of Service: ?[name], [name] 07/07/2021 7:30 A M ?Medical Record Number: [id number] ?Patient Account Number: [id number] ?Date of Birth/Sex: Treating RN: ?10-03-1985 (36 y.o. [name] ?Primary Care Leah Thornberry: [name] Other Clinician: ?Referring Lus Kriegel: ?Treating Hyatt Capobianco/Extender: [name] ?[name], [name] ?Weeks in Treatment: 62 ?Encounter Discharge Information Items Post Procedure Vitals ?Discharge Condition: Stable ?Temperature (F): 98.4 ?Ambulatory Status: Ambulatory ?Pulse (bpm): 89 ?Discharge Destination: Home ?Respiratory Rate (breaths/min): 18 ?Transportation: Private Auto ?Blood Pressure (mmHg): 111/70 ?Accompanied By: self ?Schedule Follow-up Appointment: Yes ?Clinical Summary of Care: Patient Declined ?Electronic Signature(s) ?Signed: 07/07/2021 6:23:41 PM By: Baruch Gouty RN, BSN ?Entered By: Baruch Gouty on 07/07/2021 09:08:17 ?-------------------------------------------------------------------------------- ?Lower Extremity Assessment Details ?Patient Name: ?Date of Service: ?[name], [name] 07/07/2021 7:30 A M ?Medical Record Number: [id number] ?Patient Account Number: [id number] ?Date of Birth/Sex: ?Treating RN: ?June 28, 1985 (36 y.o. [name] ?Primary Care Duvid Smalls: [name] ?Other Clinician: ?Referring Koah Chisenhall: ?Treating Annamaria Salah/Extender: [name] ?[name], [name] ?Weeks in Treatment: 46 ?Edema Assessment ?Assessed: [Left: No] [Right: No] ?Edema: [Left: Yes] [Right: Yes] ?Calf ?Left: Right: ?Point of Measurement: 48 cm From Medial Instep 48.5 cm 51.6 cm ?Ankle ?Left: Right: ?Point of Measurement: 11 cm From Medial Instep 29.5 cm 31 cm ?Vascular Assessment ?Pulses: ?Dorsalis Pedis ?Palpable: [Left:Yes] [Right:Yes] ?Electronic Signature(s) ?Signed: 07/07/2021 6:23:41 PM By: Baruch Gouty RN, BSN ?Entered By: Baruch Gouty on 07/07/2021 08:09:06 ?-------------------------------------------------------------------------------- ?Multi Wound Chart Details ?Patient Name: ?Date of Service: ?[name], [name] 07/07/2021 7:30 A M ?Medical Record Number: [id number] ?Patient Account Number: [id number] ?Date of Birth/Sex: ?Treating RN: ?02/13/1986  (36 y.o. [name] ?Primary Care Antoniette Peake: [name] ?Other Clinician: ?Referring Mouna Yager: ?Treating Leonce Bale/Extender: [name] ?[name], [name] ?Weeks in Treatment: 6 ?Vital Signs ?Height(in): 77 ?Capillary Blood Glucose(mg/dl): 130 ?Weight(lbs): 280 ?Pulse(bpm): 89 ?Body Mass Index(BMI): 33.2 ?Blood Pressure(mmHg): 111/70 ?Temperature(??F): 98.4 ?Respiratory Rate(breaths/min): 18 ?Photos: [10:Right, Anterior Foot] [3:Left, Lateral, Plantar Foot] [8:Right, Lateral Foot] ?Wound Location: [10:Gradually Appeared] [3:Trauma] [8:Blister] ?Wounding Event: [10:Diabetic Wound/Ulcer of the Lower] [3:Diabetic Wound/Ulcer of the Lower] [8:Diabetic Wound/Ulcer of the Lower] ?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] [3:Extremity Type  II Diabetes] [8:Extremity Type II Diabetes] ?Comorbid History: [10:06/19/2021] [3:10/02/2019] [8:04/18/2021] ?Date Acquired: [10:2] [3:88] [8:11] ?Weeks of Treatment: [10:Open] [3:Open] [8:Open] ?Wound Status: [10:No] [3:No] [8:No] ?Wound Recurrence: [10:0.9x0.4x0.1] [3:2.3x2.2x0.3] [8:4x2.7x1.2] ?Measurements L x W x D (cm) [10:0.283] [3:3.974] [8:8.482] ?A (cm?) : ?rea [10:0.028] [3:1.192] [8:10.179] ?Volume (cm?) : [10:72.30%] [3:-141.00%] [8:-1442.20%] ?% Reduction in A [10:rea: 72.50%] [3:-622.40%] [8:-9153.60%] ?% Reduction in Volume: [3:1] [8:2] ?Starting Position 1 (o'clock): [3:6] [8:3] ?Ending Position 1 (o'clock): [3:0.4] [8:0.5] ?Maximum Distance 1 (cm): [10:No] [3:Yes] [8:Yes] ?Undermining: [10:Grade 1] [3:Grade 2] [8:Grade 2] ?Classification: [10:Medium] [3:Medium] [8:Large] ?Exudate A mount: [10:Serosanguineous] [3:Serosanguineous] [8:Serosanguineous] ?Exudate Type: [10:red, brown] [3:red, brown] [8:red, brown] ?Exudate Color: [10:Flat and Intact] [3:Thickened] [8:Well defined, not attached] ?Wound Margin: [10:Large (67-100%)] [3:Large (67-100%)] [8:Medium (34-66%)] ?Granulation A mount: [10:Red] [3:Red, Pink] [8:Pink] ?Granulation Quality: [10:None  Present (0%)] [3:None Present (0%)] [8:Medium (34-66%)] ?Necrotic A mount: ?[10:Fat Layer (Subcutaneous Tissue): Yes Fat Layer (Subcutaneous Tissue): Yes Fat Layer (Subcutaneous Tissue): Yes] ?Exposed Structures: ?[10:Fascia: No Tendon: No Muscle: No Joint: No Bone: No Medium (34-66%)] [3:Fascia: No Tendon: No Muscle: No Joint: No Bone: No Small (1-33%)] [8:Fascia: No Tendon: No Muscle: No Joint: No Bone: No Small (1-33%)] ?Epithelialization: [10:Debridement - Selective/Open Wound Debridement - Selective/Open Wound Debridement - Excisional] ?Debridement: ?Pre-procedure Verification/Time Out 08:26 [3:08:26] [8:08:26] ?Taken: [10:Other] [3:Other] [8:Other] ?Pain Control: [10:Necrotic/Eschar] [3:Callus] [8:Fat, Subcutaneous, Slough] ?Tissue Debrided: [10:Skin/Epidermis] [3:Skin/Epidermis] [8:Skin/Subcutaneous Tissue] ?Level: [10:1] [3:0.75] [8:10.8] ?Debridement A (sq cm): [10:rea Curette] [3:Curette] [8:Curette] ?Instrument: [10:Minimum] [3:Minimum] [8:Minimum] ?Bleeding: [10:Pressure] [3:Pressure] [8:Pressure] ?Hemostasis A chieved: [10:0] [3:0] [8:0] ?Procedural Pain: [10:0] [3:0] [8:0] ?Post Procedural Pain: [10:Procedure was tolerated well] [3:Procedure was tolerated well] [8:Procedure was tolerated well] ?Debridement Treatment Response: [10:0.9x0.4x0.1] [3:2.3x2.2x0.3] [8:4x2.7x1.2] ?Post Debridement Measurements L x ?W x D (cm) [10:0.028] [3:1.192] [8:10.179] ?Post Debridement Volume: (cm?) [10:Debridement] [3:Debridement] [8:Debridement] ?Procedures Performed: [3:T Contact Cast otal] ?Treatment Notes ?Electronic Signature(s) ?Signed: 07/07/2021 8:34:45 AM By: [name] MD FACS ?Signed: 07/07/2021 6:23:41 PM By: Baruch Gouty RN, BSN ?Entered By: [name] on 07/07/2021 08:34:45 ?-------------------------------------------------------------------------------- ?Multi-Disciplinary Care Plan Details ?Patient Name: ?Date of Service: ?[name], [name] 07/07/2021 7:30 A M ?Medical Record Number:  [id number] ?Patient Account Number: [id number] ?Date of Birth/Sex: ?Treating RN: ?01-14-1986 (36 y.o. [name] ?Primary Care Jamilia Jacques: [name] ?Other Clinician: ?Referring Anastasia Tompson: ?Treating Sladen Plancarte/E

## 2021-07-07 NOTE — Progress Notes (Signed)
[place], [name] ([id number]) ?Visit Report for 07/07/2021 ?Chief Complaint Document Details ?Patient Name: Date of Service: ?[name], [name] 07/07/2021 7:30 A M ?Medical Record Number: [id number] ?Patient Account Number: [id number] ?Date of Birth/Sex: Treating RN: ?09/10/1985 (36 y.o. [name] ?Primary Care Provider: [name] Other Clinician: ?Referring Provider: ?Treating Provider/Extender: Duanne Guess ?[name], Windy Fast ?Weeks in Treatment: 58 ?Information Obtained from: Patient ?Chief Complaint ?01/11/2019; patient is here for review of a rather substantial wound over the left fifth plantar metatarsal head extending into the lateral part of his foot ?10/24/2019; patient returns to clinic with wounds on his bilateral feet with underlying osteomyelitis biopsy-proven ?Electronic Signature(s) ?Signed: 07/07/2021 8:36:32 AM By: Duanne Guess MD FACS ?Entered By: Duanne Guess on 07/07/2021 08:36:32 ?-------------------------------------------------------------------------------- ?Debridement Details ?Patient Name: Date of Service: ?[name], [name] 07/07/2021 7:30 A M ?Medical Record Number: [id number] ?Patient Account Number: [id number] ?Date of Birth/Sex: Treating RN: ?07-09-1985 (36 y.o. [name] ?Primary Care Provider: [name] Other Clinician: ?Referring Provider: ?Treating Provider/Extender: Duanne Guess ?[name], Windy Fast ?Weeks in Treatment: 47 ?Debridement Performed for Assessment: Wound #8 Right,Lateral Foot ?Performed By: Physician Duanne Guess, MD ?Debridement Type: Debridement ?Severity of Tissue Pre Debridement: Fat layer exposed ?Level of Consciousness (Pre-procedure): Awake and Alert ?Pre-procedure Verification/Time Out Yes - 08:26 ?Taken: ?Start Time: 08:27 ?Pain Control: ?Other : benzocaine 20% ?T Area Debrided (L x W): ?otal 4 (cm) x 2.7 (cm) = 10.8 (cm?) ?Tissue and other material debrided: Viable, Non-Viable, Fat, Slough, Subcutaneous, Slough ?Level:  Skin/Subcutaneous Tissue ?Debridement Description: Excisional ?Instrument: Curette ?Bleeding: Minimum ?Hemostasis Achieved: Pressure ?Procedural Pain: 0 ?Post Procedural Pain: 0 ?Response to Treatment: Procedure was tolerated well ?Level of Consciousness (Post- Awake and Alert ?procedure): ?Post Debridement Measurements of Total Wound ?Length: (cm) 4 ?Width: (cm) 2.7 ?Depth: (cm) 1.2 ?Volume: (cm?) 10.179 ?Character of Wound/Ulcer Post Debridement: Improved ?Severity of Tissue Post Debridement: Fat layer exposed ?Post Procedure Diagnosis ?Same as Pre-procedure ?Electronic Signature(s) ?Signed: 07/07/2021 12:42:55 PM By: Duanne Guess MD FACS ?Signed: 07/07/2021 6:23:41 PM By: Zenaida Deed RN, BSN ?Entered By: Zenaida Deed on 07/07/2021 08:28:59 ?-------------------------------------------------------------------------------- ?Debridement Details ?Patient Name: ?Date of Service: ?[name], [name] 07/07/2021 7:30 A M ?Medical Record Number: [id number] ?Patient Account Number: [id number] ?Date of Birth/Sex: ?Treating RN: ?01-Jul-1985 (36 y.o. [name] ?Primary Care Provider: [name] ?Other Clinician: ?Referring Provider: ?Treating Provider/Extender: Duanne Guess ?[name], Windy Fast ?Weeks in Treatment: 15 ?Debridement Performed for Assessment: Wound #10 Right,Anterior Foot ?Performed By: Physician Duanne Guess, MD ?Debridement Type: Debridement ?Severity of Tissue Pre Debridement: Fat layer exposed ?Level of Consciousness (Pre-procedure): Awake and Alert ?Pre-procedure Verification/Time Out Yes - 08:26 ?Taken: ?Start Time: 08:27 ?Pain Control: ?Other : benzocaine 20% ?T Area Debrided (L x W): ?otal 1 (cm) x 1 (cm) = 1 (cm?) ?Tissue and other material debrided: ?Non-Viable, Eschar, Skin: Epidermis ?Level: Skin/Epidermis ?Debridement Description: Selective/Open Wound ?Instrument: Curette ?Bleeding: Minimum ?Hemostasis Achieved: Pressure ?Procedural Pain: 0 ?Post Procedural Pain: 0 ?Response to  Treatment: Procedure was tolerated well ?Level of Consciousness (Post- Awake and Alert ?procedure): ?Post Debridement Measurements of Total Wound ?Length: (cm) 0.9 ?Width: (cm) 0.4 ?Depth: (cm) 0.1 ?Volume: (cm?) 0.028 ?Character of Wound/Ulcer Post Debridement: Improved ?Severity of Tissue Post Debridement: Fat layer exposed ?Post Procedure Diagnosis ?Same as Pre-procedure ?Electronic Signature(s) ?Signed: 07/07/2021 12:42:55 PM By: Duanne Guess MD FACS ?Signed: 07/07/2021 6:23:41 PM By: Zenaida Deed RN, BSN ?Entered By: Zenaida Deed on 07/07/2021 08:30:16 ?-------------------------------------------------------------------------------- ?Debridement Details ?Patient Name: Date of Service: ?[name], [name] 07/07/2021  7:30 A M ?Medical Record Number: [id number] ?Patient Account Number: [id number] ?Date of Birth/Sex: Treating RN: ?July 01, 1985 (36 y.o. [name] ?Primary Care Provider: [name] Other Clinician: ?Referring Provider: ?Treating Provider/Extender: Duanne Guess ?[name], Windy Fast ?Weeks in Treatment: 53 ?Debridement Performed for Assessment: Wound #3 Left,Lateral,Plantar Foot ?Performed By: Physician Duanne Guess, MD ?Debridement Type: Debridement ?Severity of Tissue Pre Debridement: Fat layer exposed ?Level of Consciousness (Pre-procedure): Awake and Alert ?Pre-procedure Verification/Time Out Yes - 08:26 ?Taken: ?Start Time: 08:27 ?Pain Control: ?Other : benzocaine 20% ?T Area Debrided (L x W): ?otal 1.5 (cm) x 0.5 (cm) = 0.75 (cm?) ?Tissue and other material debrided: ?Non-Viable, Callus, Skin: Epidermis ?Level: Skin/Epidermis ?Debridement Description: Selective/Open Wound ?Instrument: Curette ?Bleeding: Minimum ?Hemostasis Achieved: Pressure ?Procedural Pain: 0 ?Post Procedural Pain: 0 ?Response to Treatment: Procedure was tolerated well ?Level of Consciousness (Post- Awake and Alert ?procedure): ?Post Debridement Measurements of Total Wound ?Length: (cm) 2.3 ?Width: (cm)  2.2 ?Depth: (cm) 0.3 ?Volume: (cm?) 1.192 ?Character of Wound/Ulcer Post Debridement: Improved ?Severity of Tissue Post Debridement: Fat layer exposed ?Post Procedure Diagnosis ?Same as Pre-procedure ?Electronic Signature(s) ?Signed: 07/07/2021 12:42:55 PM By: Duanne Guess MD FACS ?Signed: 07/07/2021 6:23:41 PM By: Zenaida Deed RN, BSN ?Entered By: Zenaida Deed on 07/07/2021 08:31:24 ?-------------------------------------------------------------------------------- ?HPI Details ?Patient Name: Date of Service: ?[name], [name] 07/07/2021 7:30 A M ?Medical Record Number: [id number] ?Patient Account Number: [id number] ?Date of Birth/Sex: Treating RN: ?[birth year] (36 y.o. 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 He comes in for review of a wound over the plantar fifth metatarsal head ?extending into the lateral part of the foot. He was followed for this previously by his podiatrist Dr. Darreld Mclean. As the patient tells his story he went to see podiatry ?first for a swelling he developed on the lateral part of his fifth metatarsal head in May. He states this was "open" by podiatry and the area closed. He was ?followed up in June and it was again opened callus removed and it closed promptly. There were plans being made for surgery on the fifth metatarsal head in ?June however his blood sugar was apparently too high for anesthesia. Apparently the area was debrided and opened again in June and it is never closed since. ?Looking over the records from podiatry I am really not able to follow this. It was clear when he was first seen it was before 5/14 at that point he already had a ?wound. By 5/17 the ulcer was resolved. I do not see  anything about a procedure. On 5/28 noted to have pre-ulcerative moderate keratosis. X-ray noted 1/5 ?contracted toe and tailor's bunion and metatarsal deformity. On a visit date on 09/28/2018 the dorsal part of the left foot it healed

## 2021-07-14 ENCOUNTER — Encounter (HOSPITAL_BASED_OUTPATIENT_CLINIC_OR_DEPARTMENT_OTHER): Payer: BC Managed Care – PPO | Admitting: General Surgery

## 2021-07-14 DIAGNOSIS — E11621 Type 2 diabetes mellitus with foot ulcer: Secondary | ICD-10-CM | POA: Diagnosis not present

## 2021-07-14 NOTE — Progress Notes (Signed)
[name], Max ([id number]) ?Visit Report for 07/14/2021 ?Chief Complaint Document Details ?Patient Name: Date of Service: ?[name] D 07/14/2021 1:15 PM ?Medical Record Number: [id number] ?Patient Account Number: [id number] ?Date of Birth/Sex: Treating RN: ?17-Apr-1985 (36 y.o. [name] ?Primary Care Provider: [name] Other Clinician: ?Referring Provider: ?Treating Provider/Extender: [name] ?[name], [name] ?Weeks in Treatment: 7 ?Information Obtained from: Patient ?Chief Complaint ?01/11/2019; patient is here for review of Max rather substantial wound over the left fifth plantar metatarsal head extending into the lateral part of his foot ?10/24/2019; patient returns to clinic with wounds on his bilateral feet with underlying osteomyelitis biopsy-proven ?Electronic Signature(s) ?Signed: 07/14/2021 2:17:14 PM By: [name] MD FACS ?Entered By: [name] on 07/14/2021 14:17:14 ?-------------------------------------------------------------------------------- ?Debridement Details ?Patient Name: Date of Service: ?[name] D 07/14/2021 1:15 PM ?Medical Record Number: [id number] ?Patient Account Number: [id number] ?Date of Birth/Sex: Treating RN: ?December 20, 1985 (36 y.o. [name] ?Primary Care Provider: [name] Other Clinician: ?Referring Provider: ?Treating Provider/Extender: [name] ?[name], [name] ?Weeks in Treatment: 37 ?Debridement Performed for Assessment: Wound #8 Right,Lateral Foot ?Performed By: Physician [name], MD ?Debridement Type: Debridement ?Severity of Tissue Pre Debridement: Fat layer exposed ?Level of Consciousness (Pre-procedure): Awake and Alert ?Pre-procedure Verification/Time Out Yes - 14:05 ?Taken: ?Start Time: 14:06 ?Pain Control: ?Other : benzocaine 20% SPRAY ?T Area Debrided (L x W): ?otal 4 (cm) x 3.5 (cm) = 14 (cm?) ?Tissue and other material debrided: ?Viable, Non-Viable, Callus, Slough, Subcutaneous, Skin: Epidermis,  Slough ?Level: Skin/Subcutaneous Tissue ?Debridement Description: Excisional ?Instrument: Curette ?Bleeding: Minimum ?Hemostasis Achieved: Pressure ?Procedural Pain: 0 ?Post Procedural Pain: 0 ?Response to Treatment: Procedure was tolerated well ?Level of Consciousness (Post- Awake and Alert ?procedure): ?Post Debridement Measurements of Total Wound ?Length: (cm) 3.6 ?Width: (cm) 2.7 ?Depth: (cm) 1 ?Volume: (cm?) 7.634 ?Character of Wound/Ulcer Post Debridement: Improved ?Severity of Tissue Post Debridement: Fat layer exposed ?Post Procedure Diagnosis ?Same as Pre-procedure ?Electronic Signature(s) ?Signed: 07/14/2021 2:42:57 PM By: [name] MD FACS ?Signed: 07/14/2021 6:08:52 PM By: Baruch Gouty RN, BSN ?Entered By: Baruch Gouty on 07/14/2021 14:11:54 ?-------------------------------------------------------------------------------- ?Debridement Details ?Patient Name: ?Date of Service: ?[name], [name] 07/14/2021 1:15 PM ?Medical Record Number: [id number] ?Patient Account Number: [id number] ?Date of Birth/Sex: ?Treating RN: ?29-Apr-1985 (36 y.o. [name] ?Primary Care Provider: [name] ?Other Clinician: ?Referring Provider: ?Treating Provider/Extender: [name] ?[name], [name] ?Weeks in Treatment: 21 ?Debridement Performed for Assessment: Wound #10 Right,Anterior Foot ?Performed By: Physician [name], MD ?Debridement Type: Debridement ?Severity of Tissue Pre Debridement: Fat layer exposed ?Level of Consciousness (Pre-procedure): Awake and Alert ?Pre-procedure Verification/Time Out Yes - 14:05 ?Taken: ?Start Time: 14:06 ?Pain Control: ?Other : benzocaine 20% SPRAY ?T Area Debrided (L x W): ?otal 0.4 (cm) x 0.2 (cm) = 0.08 (cm?) ?Tissue and other material debrided: Viable, Non-Viable, Eschar ?Level: Non-Viable Tissue ?Debridement Description: Selective/Open Wound ?Instrument: Curette ?Bleeding: Minimum ?Hemostasis Achieved: Pressure ?Procedural Pain: 0 ?Post Procedural Pain:  0 ?Response to Treatment: Procedure was tolerated well ?Level of Consciousness (Post- Awake and Alert ?procedure): ?Post Debridement Measurements of Total Wound ?Length: (cm) 0.4 ?Width: (cm) 0.2 ?Depth: (cm) 0.1 ?Volume: (cm?) 0.006 ?Character of Wound/Ulcer Post Debridement: Improved ?Severity of Tissue Post Debridement: Fat layer exposed ?Post Procedure Diagnosis ?Same as Pre-procedure ?Electronic Signature(s) ?Signed: 07/14/2021 2:42:57 PM By: [name] MD FACS ?Signed: 07/14/2021 6:08:52 PM By: Baruch Gouty RN, BSN ?Entered By: Baruch Gouty on 07/14/2021 14:14:17 ?-------------------------------------------------------------------------------- ?HPI Details ?Patient Name: Date of Service: ?[name], [name]  07/14/2021 1:15 PM ?Medical Record Number: [id number] ?Patient Account Number: [id number] ?Date of Birth/Sex: Treating RN: ?[birth year] (36 y.o. 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 He comes in for review of Max wound over the plantar fifth metatarsal head ?extending into the lateral part of the foot. He was followed for this previously by his podiatrist Dr. Cornelius Moras. As the patient tells his story he went to see podiatry ?first for Max swelling he developed on the lateral part of his fifth metatarsal head in May. He states this was "open" by podiatry and the area closed. He was ?followed up in June and it was again opened callus removed and it closed promptly. There were plans being made for surgery on the fifth metatarsal head in ?June however his blood sugar was apparently too high for anesthesia. Apparently the area was debrided and opened again in June and it is never closed since. ?Looking over the records from  podiatry I am really not able to follow this. It was clear when he was first seen it was before 5/14 at that point he already had Max ?wound. By 5/17 the ulcer was resolved. I do not see anything about Max procedure. On 5/28 noted to have pre-ulcerative moderate keratosis. X-ray noted 1/5 ?contracted toe and tailor's bunion and metatarsal deformity. On Max visit date on 09/28/2018 the dorsal part of the left foot it healed and resolved. There was ?concern about swelling in his lower extremity he was sent to the ER.. As far as I can tell he was seen in the ER on 7/12 with an ulcer on his left foot. Max DVT ?rule out of the left leg was negative. I do not think I have complete records from podiatry but I am not able to verify the procedures this patient states he had. ?He states after the last procedure the wound has never closed although I am not able to follow this in the records I have from podiatry. He has not had Max ?recent x-ray ?The patient has been using Neosporin on the wound. He is wearing Max Darco shoe. He is still very active up on his foot working and exercising. ?Past medical history; type 2 diabetes ketosis-prone, leg swelling with Max negative DVT study in July. Non-smoker ?ABI in our clinic was 0.85 on the left ?10/16; substantial wound on the plantar left fifth met head extending laterally almost to the dorsal fifth MTP. We have been using silver alginate we gave him Max ?Darco forefoot off loader. An x-ray did not show evidence of osteomyelitis did note soft tissue emphysema which I think was due to gas tracking through an ?open wound. There is no doubt in my mind he requires an MRI ?10/23; MRI not booked until 3 November at the earliest this is largely due to his glucose sensor in the right arm. We have been using silver alginate. There has ?been an improvement ?10/29; I am still not exactly sure when his MRI is booked for. He says it is the third but it is the 10th in epic. This definitely needs to be done. He  is running Max ?low-grade fever today but no other symptoms. No real improvement in the 1 ?02/26/2019 patient presents today for Max follow-up visit here in our clinic he is last  been seen in the clinic on

## 2021-07-15 NOTE — Progress Notes (Signed)
[place], [name] ([id number]) ?Visit Report for 07/14/2021 ?Arrival Information Details ?Patient Name: Date of Service: ?[name] 07/14/2021 1:15 PM ?Medical Record Number: [id number] ?Patient Account Number: [id number] ?Date of Birth/Sex: Treating RN: ?[birth year] (36 y.o. [name] ?Primary Care Satoria Dunlop: [name] Other Clinician: ?Referring Dawsyn Ramsaran: ?Treating Talyah Seder/Extender: [name] ?[name], [name] ?Weeks in Treatment: 20 ?Visit Information History Since Last Visit ?Added or deleted any medications: No ?Patient Arrived: Ambulatory ?Any new allergies or adverse reactions: No ?Arrival Time: 13:31 ?Had a fall or experienced change in No ?Accompanied By: self ?activities of daily living that may affect ?Transfer Assistance: None ?risk of falls: ?Patient Identification Verified: Yes ?Signs or symptoms of abuse/neglect since last visito No ?Secondary Verification Process Completed: Yes ?Hospitalized since last visit: No ?Patient Requires Transmission-Based Precautions: No ?Implantable device outside of the clinic excluding No ?Patient Has Alerts: No ?cellular tissue based products placed in the center ?since last visit: ?Has Dressing in Place as Prescribed: Yes ?Pain Present Now: No ?Electronic Signature(s) ?Signed: 07/15/2021 10:45:31 AM By: Sandre Kitty ?Entered By: Sandre Kitty on 07/14/2021 13:32:28 ?-------------------------------------------------------------------------------- ?Encounter Discharge Information Details ?Patient Name: Date of Service: ?[name] 07/14/2021 1:15 PM ?Medical Record Number: [id number] ?Patient Account Number: [id number] ?Date of Birth/Sex: Treating RN: ?1985/12/31 (36 y.o. [name] ?Primary Care Laiden Milles: [name] Other Clinician: ?Referring Toyna Erisman: ?Treating Heidee Audi/Extender: [name] ?[name], [name] ?Weeks in Treatment: 98 ?Encounter Discharge Information Items Post Procedure Vitals ?Discharge Condition:  Stable ?Temperature (F): 98.4 ?Ambulatory Status: Ambulatory ?Pulse (bpm): 91 ?Discharge Destination: Home ?Respiratory Rate (breaths/min): 18 ?Transportation: Private Auto ?Blood Pressure (mmHg): 145/85 ?Accompanied By: self ?Schedule Follow-up Appointment: Yes ?Clinical Summary of Care: Patient Declined ?Electronic Signature(s) ?Signed: 07/14/2021 6:08:52 PM By: Baruch Gouty RN, BSN ?Entered By: Baruch Gouty on 07/14/2021 15:00:59 ?-------------------------------------------------------------------------------- ?Lower Extremity Assessment Details ?Patient Name: ?Date of Service: ?[name], [name] 07/14/2021 1:15 PM ?Medical Record Number: [id number] ?Patient Account Number: [id number] ?Date of Birth/Sex: ?Treating RN: ?1985-08-31 (36 y.o. [name] ?Primary Care Hilberto Burzynski: [name] ?Other Clinician: ?Referring Riggin Cuttino: ?Treating Raianna Slight/Extender: [name] ?[name], [name] ?Weeks in Treatment: 62 ?Edema Assessment ?Assessed: [Left: No] [Right: No] ?Edema: [Left: Yes] [Right: Yes] ?Calf ?Left: Right: ?Point of Measurement: 48 cm From Medial Instep 48.5 cm 51 cm ?Ankle ?Left: Right: ?Point of Measurement: 11 cm From Medial Instep 28.5 cm 30 cm ?Vascular Assessment ?Pulses: ?Dorsalis Pedis ?Palpable: [Left:Yes] [Right:Yes] ?Electronic Signature(s) ?Signed: 07/14/2021 6:08:52 PM By: Baruch Gouty RN, BSN ?Entered By: Baruch Gouty on 07/14/2021 13:52:21 ?-------------------------------------------------------------------------------- ?Multi Wound Chart Details ?Patient Name: ?Date of Service: ?[name], [name] 07/14/2021 1:15 PM ?Medical Record Number: [id number] ?Patient Account Number: [id number] ?Date of Birth/Sex: ?Treating RN: ?[birth year] (36 y.o. [name] ?Primary Care Jhoel Stieg: [name] ?Other Clinician: ?Referring Khyri Hinzman: ?Treating Leoda Smithhart/Extender: [name] ?[name], [name] ?Weeks in Treatment: 75 ?Vital Signs ?Height(in): 77 ?Capillary Blood Glucose(mg/dl):  120 ?Weight(lbs): 280 ?Pulse(bpm): 91 ?Body Mass Index(BMI): 33.2 ?Blood Pressure(mmHg): 145/85 ?Temperature(??F): 98.4 ?Respiratory Rate(breaths/min): 18 ?Photos: [10:Right, Anterior Foot] [3:Left, Lateral, Plantar Foot] [8:Right, Lateral Foot] ?Wound Location: [10:Gradually Appeared] [3:Trauma] [8:Blister] ?Wounding Event: [10:Diabetic Wound/Ulcer of the Lower] [8:Diabetic Wound/Ulcer of the Lower] ?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  Volume (cm?) : [10:100.00%] [3:-100.10%] [8:-1288.00%] ?% Reduction in A [10:rea: 100.00%] [3:-300.00%] [8:-6840.00%] ?% Reduction in Volume: [3:1] [8:5] ?Starting Position 1 (o'clock): [3:6] [8:3] ?Ending Position 1 (o'clock): [3:0.2] [8:1.1] ?Maximum Distance 1 (cm): [10:No] [3:Yes] [8:Yes] ?Undermining: [10:Grade 1] [3:Grade 2] [8:Grade 2] ?Classification: [10:None Present] [3:Large] [8:Large] ?Exudate A mount: [10:N/A] [3:Serosanguineous] [8:Serosanguineous] ?Exudate Type: [10:N/A] [3:red, brown] [8:red, brown] ?Exudate Color: [10:Flat and Intact] [3:Thickened] [8:Well defined, not attached] ?Wound Margin: [10:None Present (0%)] [3:Large (67-100%)] [8:Large (67-100%)] ?Granulation A mount: [10:N/A] [3:Red, Pink] [8:Red, Pink] ?Granulation Quality: [10:None Present (0%)] [3:None Present (0%)] [8:Small (1-33%)] ?Necrotic A mount: ?[10:Fascia: No] ?[3:Fat Layer (Subcutaneous Tissue): Yes Fat Layer (Subcutaneous Tissue): Yes] ?Exposed Structures: ?[10:Fat Layer (Subcutaneous Tissue): No Fascia: No Tendon: No Muscle: No Joint: No Bone: No Large (67-100%)] [3:Tendon: No  Muscle: No Joint: No Bone: No Small (1-33%)] [8:Fascia: No Tendon: No Muscle: No Joint: No Bone: No Small (1-33%)] ?Epithelialization: [10:Debridement - Selective/Open Wound N/A] [8:Debridement - Excisional] ?Debridement: ?Pre-procedure Verification/Time Out 14:05 [3:N/A] [8:14:05] ?Taken: [10:Other] [3:N/A] [8:Other] ?Pain Control: [10:Necrotic/Eschar] [3:N/A] [8:Callus, Subcutaneous, Slough] ?Tissue Debrided: [10:Non-Viable Tissue] [3:N/A] [8:Skin/Subcutaneous Tissue] ?Level: [10:0.08] [3:N/A] [8:14] ?Debridement A (sq cm): [10:rea Curette] [3:N/A] [8:Curette] ?Instrument: [10:Minimum] [3:N/A] [8:Minimum] ?Bleeding: [10:Pressure] [3:N/A] [8:Pressure] ?Hemostasis A chieved: [10:0] [3:N/A] [8:0] ?Procedural Pain: [10:0] [3:N/A] [8:0] ?Post Procedural Pain: [10:Procedure was tolerated well] [3:N/A] [8:Procedure was tolerated well] ?Debridement Treatment Response: [10:0.4x0.2x0.1] [3:N/A] [8:3.6x2.7x1] ?Post Debridement Measurements L x ?W x [name] [10:0.006] [3:N/A] [8:7.634] ?Post Debridement Volume: (cm?) [10:Debridement] [3:T Contact Cast otal] [8:Debridement] ?Treatment Notes ?Electronic Signature(s) ?Signed: 07/14/2021 2:16:48 PM By: [name] MD FACS ?Signed: 07/14/2021 6:08:52 PM By: Baruch Gouty RN, BSN ?Entered By: [name] on 07/14/2021 14:16:47 ?-------------------------------------------------------------------------------- ?Multi-Disciplinary Care Plan Details ?Patient Name: ?Date of Service: ?[name], [name] 07/14/2021 1:15 PM ?Medical Record Number: [id number] ?Patient Account Number: [id number] ?Date of Birth/Sex: ?Treating RN: ?Jul 14, 1985 (36 y.o. [name] ?Primary Care Jackie Littlejohn: [name] ?Other Clinician: ?Referring Thoren Hosang: ?Treating Gerhard Rappaport/Extender: [name] ?[name], [name] ?Weeks in Treatment: 50 ?Multidisciplinary Care Plan reviewed with physician ?Active Inactive ?Nutrition ?Nursing Diagnoses: ?Imbalanced nutrition ?Potential for alteratiion in  Nutrition/Potential for imbalanced nutrition ?Goals: ?Patient/caregiver agrees to and verbalizes understanding of need to use nutritional supplements and/or vitamins as prescribed ?Date Initiated: 10/24/2019 ?Date Inactivat

## 2021-07-22 ENCOUNTER — Encounter (HOSPITAL_BASED_OUTPATIENT_CLINIC_OR_DEPARTMENT_OTHER): Payer: BC Managed Care – PPO | Admitting: General Surgery

## 2021-07-22 DIAGNOSIS — E11621 Type 2 diabetes mellitus with foot ulcer: Secondary | ICD-10-CM | POA: Diagnosis not present

## 2021-07-23 NOTE — Progress Notes (Signed)
[place], [name] ([id number]) ?Visit Report for 07/22/2021 ?Arrival Information Details ?Patient Name: Date of Service: ?[name], [name] 07/22/2021 7:45 A M ?Medical Record Number: [id number] ?Patient Account Number: [id number] ?Date of Birth/Sex: Treating RN: ?1985/10/15 (36 y.o. [name] ?Primary Care Ashland Osmer: [name] Other Clinician: ?Referring Maude [name]: ?Treating Carreen Milius/Extender: [name] ?[name], [name] ?Weeks in Treatment: 91 ?Visit Information History Since Last Visit ?Added or deleted any medications: No ?Patient Arrived: Ambulatory ?Any new allergies or adverse reactions: No ?Arrival Time: 07:59 ?Had a fall or experienced change in No ?Accompanied By: alone ?activities of daily living that may affect ?Transfer Assistance: None ?risk of falls: ?Patient Identification Verified: Yes ?Signs or symptoms of abuse/neglect since last visito No ?Secondary Verification Process Completed: Yes ?Hospitalized since last visit: No ?Patient Requires Transmission-Based Precautions: No ?Implantable device outside of the clinic excluding No ?Patient Has Alerts: No ?cellular tissue based products placed in the center ?since last visit: ?Has Dressing in Place as Prescribed: Yes ?Has Compression in Place as Prescribed: Yes ?Has Footwear/Offloading in Place as Prescribed: Yes ?Left: T Contact Cast ?otal ?Pain Present Now: No ?Electronic Signature(s) ?Signed: 07/23/2021 6:10:38 PM By: [name] RN, BSN ?Entered By: [name] on 07/22/2021 08:00:04 ?-------------------------------------------------------------------------------- ?Encounter Discharge Information Details ?Patient Name: Date of Service: ?[name], [name] 07/22/2021 7:45 A M ?Medical Record Number: [id number] ?Patient Account Number: [id number] ?Date of Birth/Sex: Treating RN: ?[birth year] (36 y.o. [name] ?Primary Care Anija Brickner: [name] Other Clinician: ?Referring Devlyn Parish: ?Treating Jeremiyah Cullens/Extender: [name] ?[name], [name] ?Weeks in Treatment: 91 ?Encounter Discharge Information Items Post Procedure Vitals ?Discharge Condition: Stable ?Temperature (F): 98.5 ?Ambulatory Status: Ambulatory ?Pulse (bpm): 91 ?Discharge Destination: Home ?Respiratory Rate (breaths/min): 18 ?Transportation: Private Auto ?Blood Pressure (mmHg): 134/84 ?Accompanied By: alone ?Schedule Follow-up Appointment: Yes ?Clinical Summary of Care: Patient Declined ?Electronic Signature(s) ?Signed: 07/23/2021 6:10:38 PM By: [name] RN, BSN ?Entered By: [name] on 07/22/2021 11:56:10 ?-------------------------------------------------------------------------------- ?Lower Extremity Assessment Details ?Patient Name: ?Date of Service: ?[name], [name] 07/22/2021 7:45 A M ?Medical Record Number: [id number] ?Patient Account Number: [id number] ?Date of Birth/Sex: ?Treating RN: ?1986-02-20 (36 y.o. [name] ?Primary Care Avriel Kandel: [name] ?Other Clinician: ?Referring Hoang Reich: ?Treating Inocencia Murtaugh/Extender: [name] ?[name], [name] ?Weeks in Treatment: 91 ?Edema Assessment ?Assessed: [Left: No] [Right: No] ?Edema: [Left: Yes] [Right: Yes] ?Calf ?Left: Right: ?Point of Measurement: 48 cm From Medial Instep 48.5 cm 51 cm ?Ankle ?Left: Right: ?Point of Measurement: 11 cm From Medial Instep 28.5 cm 30 cm ?Vascular Assessment ?Pulses: ?Dorsalis Pedis ?Palpable: [Left:Yes] [Right:Yes] ?Electronic Signature(s) ?Signed: 07/23/2021 6:10:38 PM By: [name] RN, BSN ?Entered By: [name] on 07/22/2021 08:26:02 ?-------------------------------------------------------------------------------- ?Multi Wound Chart Details ?Patient Name: ?Date of Service: ?[name], [name] 07/22/2021 7:45 A M ?Medical Record Number: [id number] ?Patient Account Number: [id number] ?Date of Birth/Sex: ?Treating RN: ?January 11, 1986 (36 y.o. [name] ?Primary Care Payson Evrard: [name] ?Other Clinician: ?Referring Shavonda Wiedman: ?Treating  Melaney Tellefsen/Extender: [name] ?[name], [name] ?Weeks in Treatment: 91 ?Vital Signs ?Height(in): 77 ?Capillary Blood Glucose(mg/dl): 70 ?Weight(lbs): 280 ?Pulse(bpm): 91 ?Body Mass Index(BMI): 33.2 ?Blood Pressure(mmHg): 134/84 ?Temperature(??F): 98.5 ?Respiratory Rate(breaths/min): 18 ?Photos: [10:Right, Anterior Foot] [3:Left, Lateral, Plantar Foot] [8:Right, Lateral Foot] ?Wound Location: [10:Gradually Appeared] [3:Trauma] [8:Blister] ?Wounding Event: [10:Diabetic Wound/Ulcer of the LowerDiabetic Wound/Ulcer of the Lower] [8:Diabetic Wound/Ulcer of the Lower] ?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] [3:Extremity Type II Diabetes] [8:Extremity Type II Diabetes] ?Comorbid History: [10:06/19/2021] [3:10/02/2019] [8:04/18/2021] ?Date Acquired: [10:4] [3:91] [8:13] ?Weeks of Treatment: [10:Open] [  3:Open] [8:Open] ?Wound Status: [10:No] [3:No] [8:No] ?Wound Recurrence: [10:0.2x0.6x0.1] [3:2x2.1x0.3] [8:4x3.3x1.6] ?Measurements L x W x [name] [10:0.094] [3:3.299] [8:10.367] ?A (cm?) : ?rea [10:0.009] [3:0.99] [8:16.588] ?Volume (cm?) : [10:90.80%] [3:-100.10%] [8:-1784.90%] ?% Reduction in A [10:rea: 91.20%] [3:-500.00%] [8:-14980.00%] ?% Reduction in Volume: [3:12] [8:1] ?Starting Position 1 (o'clock): [3:6] [8:3] ?Ending Position 1 (o'clock): [3:0.2] [8:1.2] ?Maximum Distance 1 (cm): [10:No] [3:Yes] [8:Yes] ?Undermining: [10:Grade 1] [3:Grade 2] [8:Grade 2] ?Classification: [10:Medium] [3:Medium] [8:Large] ?Exudate A mount: [10:Serosanguineous] [3:Serosanguineous] [8:Serosanguineous] ?Exudate Type: [10:red, brown] [3:red, brown] [8:red, brown] ?Exudate Color: [10:Flat and Intact] [3:Thickened] [8:Well defined, not attached] ?Wound Margin: [10:Large (67-100%)] [3:Large (67-100%)] [8:Large (67-100%)] ?Granulation A mount: [10:Red] [3:Red, Pink] [8:Red, Pink] ?Granulation Quality: [10:None Present (0%)] [3:Small (1-33%)] [8:Small (1-33%)] ?Necrotic A mount: ?[10:Fat Layer (Subcutaneous Tissue): Yes Fat Layer  (Subcutaneous Tissue): Yes Fat Layer (Subcutaneous Tissue): Yes] ?Exposed Structures: ?[10:Fascia: No Tendon: No Muscle: No Joint: No Bone: No Large (67-100%)] [3:Fascia: No Tendon: No Muscle: No Joint: No Bone: No Small (1-33%)] [8:Fascia: No Tendon: No Muscle: No Joint: No Bone: No Small (1-33%)] ?Epithelialization: [10:N/A] [3:N/A] [8:Debridement - Excisional] ?Debridement: ?Pre-procedure Verification/Time Out N/A [3:N/A] [8:08:29] ?Taken: [10:N/A] [3:N/A] [8:Fat, Callus, Slough] ?Tissue Debrided: [10:N/A] [3:N/A] [8:Skin/Subcutaneous Tissue] ?Level: [10:N/A] [3:N/A] [8:13.2] ?Debridement A (sq cm): [10:rea N/A] [3:N/A] [8:Curette] ?Instrument: [10:N/A] [3:N/A] [8:Minimum] ?Bleeding: [10:N/A] [3:N/A] [8:Pressure] ?Hemostasis A chieved: [10:N/A] [3:N/A] [8:0] ?Procedural Pain: [10:N/A] [3:N/A] [8:0] ?Post Procedural Pain: [10:N/A] [3:N/A] [8:Procedure was tolerated well] ?Debridement Treatment Response: [10:N/A] [3:N/A] [8:4x3.3x1.6] ?Post Debridement Measurements L x ?W x [name] [10:N/A] [3:N/A] [8:16.588] ?Post Debridement Volume: (cm?) [10:N/A] [3:Soft Cast] [8:Debridement] ?Treatment Notes ?Electronic Signature(s) ?Signed: 07/22/2021 8:55:39 AM By: [name] MD FACS ?Signed: 07/23/2021 6:10:38 PM By: [name] RN, BSN ?Entered By: [name] on 07/22/2021 08:55:38 ?-------------------------------------------------------------------------------- ?Multi-Disciplinary Care Plan Details ?Patient Name: ?Date of Service: ?[name], [name] 07/22/2021 7:45 A M ?Medical Record Number: [id number] ?Patient Account Number: [id number] ?Date of Birth/Sex: ?Treating RN: ?[birth year] (36 y.o. [name] ?Primary Care Brailyn Delman: [name] ?Other Clinician: ?Referring Tavon Corriher: ?Treating Gerado Nabers/Extender: [name] ?[name], [name] ?Weeks in Treatment: 91 ?Multidisciplinary Care Plan reviewed with physician ?Active Inactive ?Nutrition ?Nursing Diagnoses: ?Imbalanced nutrition ?Potential for  alteratiion in Nutrition/Potential for imbalanced nutrition ?Goals: ?Patient/caregiver agrees to and verbalizes understanding of need to use nutritional supplements and/or vitamins as prescribed ?Date Initiated: 10/24/2019 ?Date

## 2021-07-23 NOTE — Progress Notes (Signed)
[name], Max ([id number]) ?Visit Report for 07/22/2021 ?Chief Complaint Document Details ?Patient Name: Date of Service: ?[name], [name] 07/22/2021 7:45 A M ?Medical Record Number: [id number] ?Patient Account Number: [id number] ?Date of Birth/Sex: Treating RN: ?04-17-1985 (36 y.o. [name] ?Primary Care Provider: Seward [name] Other Clinician: ?Referring Provider: ?Treating Provider/Extender: [name] ?[name] ?Weeks in Treatment: 91 ?Information Obtained from: Patient ?Chief Complaint ?01/11/2019; patient is here for review of a rather substantial wound over the left fifth plantar metatarsal head extending into the lateral part of his foot ?10/24/2019; patient returns to clinic with wounds on his bilateral feet with underlying osteomyelitis biopsy-proven ?Electronic Signature(s) ?Signed: 07/22/2021 8:55:48 AM By: Max Maudlin MD FACS ?Entered By: [name] on 07/22/2021 08:55:47 ?-------------------------------------------------------------------------------- ?Debridement Details ?Patient Name: Date of Service: ?[name], [name] 07/22/2021 7:45 A M ?Medical Record Number: [id number] ?Patient Account Number: [id number] ?Date of Birth/Sex: Treating RN: ?27-Aug-1985 (36 y.o. [name] ?Primary Care Provider: Seward [name] Other Clinician: ?Referring Provider: ?Treating Provider/Extender: [name] ?[name] ?Weeks in Treatment: 91 ?Debridement Performed for Assessment: Wound #8 Right,Lateral Foot ?Performed By: Physician Max Maudlin, MD ?Debridement Type: Debridement ?Severity of Tissue Pre Debridement: Fat layer exposed ?Level of Consciousness (Pre-procedure): Awake and Alert ?Pre-procedure Verification/Time Out Yes - 08:29 ?Taken: ?Start Time: 08:29 ?T Area Debrided (L x W): ?otal 4 (cm) x 3.3 (cm) = 13.2 (cm?) ?Tissue and other material debrided: Non-Viable, Callus, Fat, Slough, [place] ?Level: Skin/Subcutaneous Tissue ?Debridement Description:  Excisional ?Instrument: Curette ?Bleeding: Minimum ?Hemostasis Achieved: Pressure ?End Time: 08:31 ?Procedural Pain: 0 ?Post Procedural Pain: 0 ?Response to Treatment: Procedure was tolerated well ?Level of Consciousness (Post- Awake and Alert ?procedure): ?Post Debridement Measurements of Total Wound ?Length: (cm) 4 ?Width: (cm) 3.3 ?Depth: (cm) 1.6 ?Volume: (cm?) 16.588 ?Character of Wound/Ulcer Post Debridement: Improved ?Severity of Tissue Post Debridement: Fat layer exposed ?Post Procedure Diagnosis ?Same as Pre-procedure ?Electronic Signature(s) ?Signed: 07/22/2021 10:23:37 AM By: Max Maudlin MD FACS ?Signed: 07/23/2021 6:10:38 PM By: Max Hurst RN, BSN ?Entered By: [name] on 07/22/2021 08:38:35 ?-------------------------------------------------------------------------------- ?HPI Details ?Patient Name: Date of Service: ?[name], [name] 07/22/2021 7:45 A M ?Medical Record Number: [id number] ?Patient Account Number: [id number] ?Date of Birth/Sex: Treating RN: ?12-06-1985 (36 y.o. 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 He comes in for review of a wound over the plantar fifth metatarsal head ?extending into the lateral part of the foot. He was followed for this previously by his podiatrist Dr. Cornelius [name]. As the patient tells his story he went to see podiatry ?first for a swelling he developed on the lateral part of his fifth metatarsal head in May. He states this was "open" by podiatry and the area closed. He was ?followed up in June and it was again opened callus removed and it closed promptly. There were plans being made for surgery on the fifth metatarsal head in ?June however his blood sugar was apparently too  high for anesthesia. Apparently the area was debrided and opened again in June and it is never closed since. ?Looking over the records from podiatry I am really not able to follow this. It was clear when he was first seen it was before 5/14 at that point he already had a ?wound. By 5/17 the ulcer  was resolved. I do not see anything about a procedure. On 5/28 noted to have pre-ulcerative moderate keratosis. X-ray noted 1/5 ?contracted toe and tailor's bunion and metatarsal deformity. On a visit date on 09/28/2018 the dorsal part of the left foot it healed and resolved. There was ?concern about swelling in his lower extremity he was sent to the ER.. As far as I can tell he was seen in the ER on 7/12 with an ulcer on his left foot. A DVT ?rule out of the left leg was negative. I do not think I have complete records from podiatry but I am not able to verify the procedures this patient states he had. ?He states after the last procedure the wound has never closed although I am not able to follow this in the records I have from podiatry. He has not had a ?recent x-ray ?The patient has been using Neosporin on the wound. He is wearing a Darco shoe. He is still very active up on his foot working and exercising. ?Past medical history; type 2 diabetes ketosis-prone, leg swelling with a negative DVT study in July. Non-smoker ?ABI in our clinic was 0.85 on the left ?10/16; substantial wound on the plantar left fifth met head extending laterally almost to the dorsal fifth MTP. We have been using silver alginate we gave him a ?Darco forefoot off loader. An x-ray did not show evidence of osteomyelitis did note soft tissue emphysema which I think was due to gas tracking through an ?open wound. There is no doubt in my mind he requires an MRI ?10/23; MRI not booked until 3 November at the earliest this is largely due to his glucose sensor in the right arm. We have been using silver alginate. There has ?been an improvement ?10/29; I am  still not exactly sure when his MRI is booked for. He says it is the third but it is the 10th in epic. This definitely needs to be done. He is running a ?low-grade fever today but no other symptoms. No real improvement in the 1 ?02/26/2019 patient presents today for a follow-up visit here in our clinic he is last been seen in the clinic on October 29. Subsequently we were working on ?getting MRI to evaluate and see what exactly was going on and where we would need to go from the standpoint of whether or not he had osteomyelitis and ?again what treatments were going be required. Subsequently the patient ended up being admitted to the hospital on 02/07/2019 and was discharged on ?02/14/2019. This is a somewhat interesting admission with a discharge diagnosis of pneumonia due to COVID-19 although he was positive for COVID-19 when ?tested at the urgent care but negative x2 when he was actually in the hospital. With that being said he did have acute respiratory failure with hypoxia and it was ?noted he also have a left foot ulceration with osteomyelitis. With that being said he did require oxygen for his pneumonia and I level 4 L. He was placed on ?antivirals and steroids for the COVID-19. He was also transferred to the [facility] at one point. Nonetheless he did subsequently discharged home ?and since being home has done much better in that regard. The CT angiogram did not show any pulmonary embolism. With regard to the osteomyelitis the ?patient was placed on vancomycin and Zosyn while in the hospital but has been changed to Augmentin at discharge. It was also recommended that he follow- ?up with wound care and podiatry. Podiatry however  wanted him to see [name] according to the patient prior to them doing anything further. His hemoglobin A1c was ?9.9 as noted in the hospital. Have an MRI of the left foot performed while in the hospital on 02/04/2019. 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 There is an overlying plantar open wound noted an abscess tracking back ?along the lateral aspect of the fifth metatarsal shaft. There is

## 2021-07-26 ENCOUNTER — Encounter (HOSPITAL_BASED_OUTPATIENT_CLINIC_OR_DEPARTMENT_OTHER): Payer: BC Managed Care – PPO | Admitting: General Surgery

## 2021-07-26 DIAGNOSIS — E11621 Type 2 diabetes mellitus with foot ulcer: Secondary | ICD-10-CM | POA: Diagnosis not present

## 2021-07-26 NOTE — Progress Notes (Signed)
[place], [name] ([id number]) ?Visit Report for 07/26/2021 ?Soft Cast Details ?Patient Name: Date of Service: ?[name], [name] 07/26/2021 7:30 A M ?Medical Record Number: [id number] ?Patient Account Number: [id number] ?Date of Birth/Sex: Treating RN: ?02/20/1986 (36 y.o. [name] ?Primary Care Provider: [name] Other Clinician: ?Referring Provider: ?Treating Provider/Extender: [name] ?[name], [name] ?Weeks in Treatment: 91 ?Procedure Performed for: Wound #3 Left,Lateral,Plantar Foot ?Performed By: Clinician [name], RN ?Electronic Signature(s) ?Signed: 07/26/2021 9:07:19 AM By: [name] MD FACS ?Signed: 07/26/2021 5:29:07 PM By: [name] RN, BSN ?Entered By: [name] on 07/26/2021 08:50:20 ?-------------------------------------------------------------------------------- ?SuperBill Details ?Patient Name: Date of Service: ?[name], [name] 07/26/2021 ?Medical Record Number: [id number] ?Patient Account Number: [id number] ?Date of Birth/Sex: Treating RN: ?1985-07-25 (36 y.o. 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

## 2021-07-26 NOTE — Progress Notes (Signed)
[name] ([id number]) ?Visit Report for 07/26/2021 ?Arrival Information Details ?Patient Name: Date of Service: ?[name], [name] 07/26/2021 7:30 A M ?Medical Record Number: [id number] ?Patient Account Number: [id number] ?Date of Birth/Sex: Treating RN: ?[birth year] (36 y.o. [name] ?Primary Care Towanda Hornstein: [name] Other Clinician: ?Referring Winfrey Chillemi: ?Treating Lindzey Zent/Extender: [name] ?[name], [name] ?Weeks in Treatment: 91 ?Visit Information History Since Last Visit ?Added or deleted any medications: No ?Patient Arrived: Ambulatory ?Any new allergies or adverse reactions: No ?Arrival Time: 07:48 ?Had a fall or experienced change in No ?Accompanied By: alone ?activities of daily living that may affect ?Transfer Assistance: None ?risk of falls: ?Patient Identification Verified: Yes ?Signs or symptoms of abuse/neglect since last visito No ?Secondary Verification Process Completed: Yes ?Hospitalized since last visit: No ?Patient Requires Transmission-Based Precautions: No ?Implantable device outside of the clinic excluding No ?Patient Has Alerts: No ?cellular tissue based products placed in the center ?since last visit: ?Has Dressing in Place as Prescribed: Yes ?Has Footwear/Offloading in Place as Prescribed: Yes ?Left: Other:soft cast ?Pain Present Now: No ?Electronic Signature(s) ?Signed: 07/26/2021 5:29:07 PM By: [name] RN, BSN ?Entered By: [name] on 07/26/2021 08:40:01 ?-------------------------------------------------------------------------------- ?Encounter Discharge Information Details ?Patient Name: Date of Service: ?[name], [name] 07/26/2021 7:30 A M ?Medical Record Number: [id number] ?Patient Account Number: [id number] ?Date of Birth/Sex: Treating RN: ?Oct 23, 1985 (36 y.o. [name] ?Primary Care Gradie Butrick: [name] Other Clinician: ?Referring Sybel Standish: ?Treating Jonda Alanis/Extender: [name] ?[name], [name] ?Weeks in Treatment: 91 ?Encounter  Discharge Information Items ?Discharge Condition: Stable ?Ambulatory Status: Ambulatory ?Discharge Destination: Home ?Transportation: Private Auto ?Accompanied By: alone ?Schedule Follow-up Appointment: Yes ?Clinical Summary of Care: Patient Declined ?Electronic Signature(s) ?Signed: 07/26/2021 5:29:07 PM By: [name] RN, BSN ?Entered By: [name] on 07/26/2021 08:51:01 ?-------------------------------------------------------------------------------- ?Wound Assessment Details ?Patient Name: ?Date of Service: ?[name], [name] 07/26/2021 7:30 A M ?Medical Record Number: [id number] ?Patient Account Number: [id number] ?Date of Birth/Sex: ?Treating RN: ?1985-11-03 (35 y.o. [name] ?Primary Care Adian Jablonowski: [name] ?Other Clinician: ?Referring Zell Doucette: ?Treating Krissi Willaims/Extender: [name] ?[name], [name] ?Weeks in Treatment: 91 ?Wound Status ?Wound Number: 3 ?Primary Etiology: Diabetic Wound/Ulcer of the Lower Extremity ?Wound Location: Left, Lateral, Plantar Foot ?Wound Status: Open ?Wounding Event: Trauma ?Comorbid History: Type II Diabetes ?Date Acquired: 10/02/2019 ?Weeks Of Treatment: 91 ?Clustered Wound: No ?Wound Measurements ?Length: (cm) 2 ?Width: (cm) 2.1 ?Depth: (cm) 0.3 ?Area: (cm?) 3.299 ?Volume: (cm?) 0.99 ?% Reduction in Area: -100.1% ?% Reduction in Volume: -500% ?Epithelialization: Small (1-33%) ?Tunneling: No ?Undermining: Yes ?Starting Position (o'clock): 12 ?Ending Position (o'clock): 6 ?Maximum Distance: (cm) 0.2 ?Wound Description ?Classification: Grade 2 ?Wound Margin: Thickened ?Exudate Amount: Medium ?Exudate Type: Serosanguineous ?Exudate Color: red, brown ?Foul Odor After Cleansing: No ?Slough/Fibrino No ?Wound Bed ?Granulation Amount: Large (67-100%) Exposed Structure ?Granulation Quality: Red, Pink ?Fascia Exposed: No ?Necrotic Amount: Small (1-33%) ?Fat Layer (Subcutaneous Tissue) Exposed: Yes ?Necrotic Quality: Adherent Slough ?Tendon Exposed: No ?Muscle  Exposed: No ?Joint Exposed: No ?Bone Exposed: No ?Treatment Notes ?Wound #3 (Foot) Wound Laterality: Plantar, Left, Lateral ?Cleanser ?Soap and Water ?Discharge Instruction: May shower and wash wound with dial antibacterial soap and water prior to dressing change. ?Peri-Wound Care ?Topical ?keystone antibiotic compound ?Primary Dressing ?KerraCel Ag Gelling Fiber Dressing, 2x2 in (silver alginate) ?Discharge Instruction: Apply silver alginate to wound bed as instructed ?Secondary Dressing ?ABD Pad, 8x10 ?Discharge Instruction: Apply over primary dressing as directed. ?Optifoam Non-Adhesive Dressing, 4x4 in ?Discharge Instruction: Apply over primary dressing as directed. ?Zetuvit  Plus 4x8 in ?Discharge Instruction: Apply over primary dressing as directed. ?Secured With ?Compression Wrap ?Kerlix Roll 4.5x3.1 (in/yd) ?Discharge Instruction: Apply Kerlix and Coban compression as directed. ?Coban Self-Adherent Wrap 4x5 (in/yd) ?Discharge Instruction: Apply over Kerlix as directed. ?Compression Stockings ?Add-Ons ?Electronic Signature(s) ?Signed: 07/26/2021 5:29:07 PM By: [name] RN, BSN ?Entered By: [name] on 07/26/2021 08:49:55 ?-------------------------------------------------------------------------------- ?Vitals Details ?Patient Name: ?Date of Service: ?[name], [name] 07/26/2021 7:30 A M ?Medical Record Number: [id number] ?Patient Account Number: [id number] ?Date of Birth/Sex: ?Treating RN: ?[birth year] (36 y.o. [name] ?Primary Care Delila Kuklinski: [name] ?Other Clinician: ?Referring Jeferson [name]: ?Treating Eyonna Sandstrom/Extender: [name] ?[name], [name] ?Weeks in Treatment: 91 ?Vital Signs ?Time Taken: 07:48 ?Temperature (??F): 98.7 ?Height (in): 77 ?Pulse (bpm): 84 ?Weight (lbs): 280 ?Respiratory Rate (breaths/min): 16 ?Body Mass Index (BMI): 33.2 ?Blood Pressure (mmHg): 128/84 ?Capillary Blood Glucose (mg/dl): 111 ?Reference Range: 80 - 120 mg / dl ?Notes ?glucose per pt report this  AM ?Electronic Signature(s) ?Signed: 07/26/2021 5:29:07 PM By: [name] RN, BSN ?Entered By: [name] on 07/26/2021 08:48:57 ?

## 2021-07-27 ENCOUNTER — Ambulatory Visit: Payer: BC Managed Care – PPO | Admitting: Podiatry

## 2021-07-27 ENCOUNTER — Encounter: Payer: Self-pay | Admitting: Podiatry

## 2021-07-27 DIAGNOSIS — B351 Tinea unguium: Secondary | ICD-10-CM

## 2021-07-27 DIAGNOSIS — M79674 Pain in right toe(s): Secondary | ICD-10-CM

## 2021-07-27 DIAGNOSIS — M79675 Pain in left toe(s): Secondary | ICD-10-CM | POA: Diagnosis not present

## 2021-07-27 DIAGNOSIS — E1149 Type 2 diabetes mellitus with other diabetic neurological complication: Secondary | ICD-10-CM

## 2021-07-27 NOTE — Progress Notes (Signed)
Subjective: ?36 year old male presents the office today for diabetic foot exam, thick, elongated toes that he cannot trim himself.  He does have wounds in both of his feet he is currently being treated by the wound care center and he does not want the bandages removed today.  Denies any fevers or chills any other concerns today. ? ?Objective: ?AAO x3, NAD ?Unable to fully evaluate his feet as his bandages on that he did not want removed.  The left foot is completely wrapped.  The right foot is also wrapped up.  There is an immediate cap refill time to the exposed digits.  The nails 1 through 4 on the right foot are hypertrophic, dystrophic and elongated.  The hallux toenail is actually putting pressure on the second toe causing a preulcerative lesion.  There is no open sores to the toes otherwise.  No drainage or pus. ?No pain with calf compression, swelling, warmth, erythema ? ?Assessment: ?Symptomatic onychomycosis ? ?Plan: ?-All treatment options discussed with the patient including all alternatives, risks, complications.  ?-Sharply debride the nails x4 without any complications or bleeding on the right foot.  Will defer to the wound care center for further treatment of the wounds.  The exam was limited today because of the bandages. ?-Patient encouraged to call the office with any questions, concerns, change in symptoms.  ? ?Vivi Barrack DPM ? ? ?

## 2021-07-30 ENCOUNTER — Encounter (HOSPITAL_BASED_OUTPATIENT_CLINIC_OR_DEPARTMENT_OTHER): Payer: BC Managed Care – PPO | Admitting: General Surgery

## 2021-07-30 DIAGNOSIS — E11621 Type 2 diabetes mellitus with foot ulcer: Secondary | ICD-10-CM | POA: Diagnosis not present

## 2021-08-02 NOTE — Progress Notes (Signed)
[name], Max ([id number]) ?Visit Report for 07/30/2021 ?Arrival Information Details ?Patient Name: Date of Service: ?[name], [name] 07/30/2021 7:45 Max M ?Medical Record Number: [id number] ?Patient Account Number: [id number] ?Date of Birth/Sex: Treating RN: ?08/17/1985 (36 y.o. [name] ?Primary Care Shaneil Yazdi: [name] Other Clinician: ?Referring Prescilla Monger: ?Treating Broghan Pannone/Extender: [name] ?[name], [name] ?Weeks in Treatment: 92 ?Visit Information History Since Last Visit ?Added or deleted any medications: No ?Patient Arrived: Ambulatory ?Any new allergies or adverse reactions: No ?Arrival Time: 08:05 ?Had Max fall or experienced change in No ?Accompanied By: self ?activities of daily living that may affect ?Transfer Assistance: None ?risk of falls: ?Patient Identification Verified: Yes ?Signs or symptoms of abuse/neglect since last visito No ?Secondary Verification Process Completed: Yes ?Hospitalized since last visit: No ?Patient Requires Transmission-Based Precautions: No ?Implantable device outside of the clinic excluding No ?Patient Has Alerts: No ?cellular tissue based products placed in the center ?since last visit: ?Has Dressing in Place as Prescribed: Yes ?Has Footwear/Offloading in Place as Prescribed: Yes ?Left: Other:soft cast ?Pain Present Now: No ?Electronic Signature(s) ?Signed: 07/30/2021 3:54:04 PM By: Baruch Gouty RN, BSN ?Entered By: Baruch Gouty on 07/30/2021 08:06:34 ?-------------------------------------------------------------------------------- ?Encounter Discharge Information Details ?Patient Name: Date of Service: ?[name], [name] 07/30/2021 7:45 Max M ?Medical Record Number: [id number] ?Patient Account Number: [id number] ?Date of Birth/Sex: Treating RN: ?[birth year] (36 y.o. [name] ?Primary Care Maziah Keeling: [name] Other Clinician: ?Referring Octavia Mottola: ?Treating Jazmen Lindenbaum/Extender: [name] ?[name], [name] ?Weeks in Treatment: 92 ?Encounter  Discharge Information Items Post Procedure Vitals ?Discharge Condition: Stable ?Temperature (F): 98.7 ?Ambulatory Status: Ambulatory ?Pulse (bpm): 87 ?Discharge Destination: Home ?Respiratory Rate (breaths/min): 18 ?Transportation: Private Auto ?Blood Pressure (mmHg): 143/91 ?Accompanied By: self ?Schedule Follow-up Appointment: Yes ?Clinical Summary of Care: Patient Declined ?Electronic Signature(s) ?Signed: 07/30/2021 3:54:04 PM By: Baruch Gouty RN, BSN ?Entered By: Baruch Gouty on 07/30/2021 09:28:43 ?-------------------------------------------------------------------------------- ?Lower Extremity Assessment Details ?Patient Name: ?Date of Service: ?[name], [name] 07/30/2021 7:45 Max M ?Medical Record Number: [id number] ?Patient Account Number: [id number] ?Date of Birth/Sex: ?Treating RN: ?1986/03/15 (36 y.o. [name] ?Primary Care Cherree Conerly: [name] ?Other Clinician: ?Referring Maymuna Detzel: ?Treating Daisey Caloca/Extender: [name] ?[name], [name] ?Weeks in Treatment: 92 ?Edema Assessment ?Assessed: [Left: No] [Right: No] ?Edema: [Left: Yes] [Right: Yes] ?Calf ?Left: Right: ?Point of Measurement: 48 cm From Medial Instep 49.4 cm 49 cm ?Ankle ?Left: Right: ?Point of Measurement: 11 cm From Medial Instep 29.5 cm 29 cm ?Vascular Assessment ?Pulses: ?Dorsalis Pedis ?Palpable: [Left:Yes] [Right:Yes] ?Electronic Signature(s) ?Signed: 07/30/2021 3:54:04 PM By: Baruch Gouty RN, BSN ?Entered By: Baruch Gouty on 07/30/2021 08:12:03 ?-------------------------------------------------------------------------------- ?Multi Wound Chart Details ?Patient Name: ?Date of Service: ?[name], [name] 07/30/2021 7:45 Max M ?Medical Record Number: [id number] ?Patient Account Number: [id number] ?Date of Birth/Sex: ?Treating RN: ?[birth year] (36 y.o. [name] ?Primary Care Liann Spaeth: [name] ?Other Clinician: ?Referring [place]: ?Treating Lyndie Vanderloop/Extender: [name] ?[name], [name] ?Weeks in  Treatment: 92 ?Vital Signs ?Height(in): 77 ?Capillary Blood Glucose(mg/dl): 121 ?Weight(lbs): 280 ?Pulse(bpm): 87 ?Body Mass Index(BMI): 33.2 ?Blood Pressure(mmHg): 143/91 ?Temperature(??F): 98.4 ?Respiratory Rate(breaths/min): 18 ?Photos: [10:No Photos Right, Anterior Foot] [3:Left, Lateral, Plantar Foot] [8:Right, Lateral Foot] ?Wound Location: [10:Gradually Appeared] [3:Trauma] [8:Blister] ?Wounding Event: [10:Diabetic Wound/Ulcer of the Lower] [8:Diabetic Wound/Ulcer of the Lower] ?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] [3:Extremity Type II Diabetes] [8:Extremity Type II Diabetes] ?Comorbid History: [10:06/19/2021] [3:10/02/2019] [8:04/18/2021] ?Date Acquired: [10:5] [3:92] [8:14] ?Weeks of Treatment: [10:Open] [3:Open] [8:Open] ?Wound Status: [10:No] [3:No] [8:No] ?Wound Recurrence: [10:0x0x0] [3:1.8x1.1x0.5] [  8:3.6x3.4x1.4] ?Measurements L x W x [name] [10:0] [3:1.555] [8:9.613] ?Max (cm?) : ?rea [10:0] [3:0.778] [8:13.459] ?Volume (cm?) : [10:100.00%] [3:5.70%] [8:-1647.80%] ?% Reduction in Max [10:rea: 100.00%] [3:-371.50%] [8:-12135.50%] ?% Reduction in Volume: [3:3] [8:6] ?Starting Position 1 (o'clock): [3:8] [8:9] ?Ending Position 1 (o'clock): [3:0.2] [8:1.1] ?Maximum Distance 1 (cm): [10:No] [3:Yes] [8:Yes] ?Undermining: [10:Grade 1] [3:Grade 2] [8:Grade 2] ?Classification: [10:None Present] [3:Medium] [8:Large] ?Exudate Max mount: [10:N/Max] [3:Serosanguineous] [8:Serosanguineous] ?Exudate Type: [10:N/Max] [3:red, brown] [8:red, brown] ?Exudate Color: [10:Flat and Intact] [3:Thickened] [8:Well defined, not attached] ?Wound Margin: [10:None Present (0%)] [3:Large (67-100%)] [8:Large (67-100%)] ?Granulation Max mount: [10:N/Max] [3:Red, Pink] [8:Red, Pink] ?Granulation Quality: [10:None Present (0%)] [3:Small (1-33%)] [8:Small (1-33%)] ?Necrotic Max mount: ?[10:Fascia: No] ?[3:Fat Layer (Subcutaneous Tissue): Yes Fat Layer (Subcutaneous Tissue): Yes] ?Exposed Structures: ?[10:Fat Layer (Subcutaneous Tissue): No  Fascia: No Tendon: No Muscle: No Joint: No Bone: No Large (67-100%)] [3:Tendon: No Muscle: No Joint: No Bone: No Small (1-33%)] [8:Muscle: Yes Fascia: No Tendon: No Joint: No Bone: No Small (1-33%)] ?Epithelialization: [10:N/Max] [3:Debridement - Selective/Open Wound Debridement - Excisional] ?Debridement: ?Pre-procedure Verification/Time Out N/Max [3:08:25] [8:08:25] ?Taken: [10:N/Max] [3:Other] [8:Other] ?Pain Control: [10:N/Max] [3:Callus] [8:Callus, Subcutaneous, Slough] ?Tissue Debrided: [10:N/Max] [3:Skin/Epidermis] [8:Skin/Subcutaneous Tissue] ?Level: [10:N/Max] [3:1.08] [8:18.4] ?Debridement Max (sq cm): [10:rea N/Max] [3:Curette] [8:Curette] ?Instrument: [10:N/Max] [3:Minimum] [8:Minimum] ?Bleeding: [10:N/Max] [3:Pressure] [8:Silver Nitrate] ?Hemostasis Max chieved: [10:N/Max] [3:0] [8:0] ?Procedural Pain: [10:N/Max] [3:0] [8:0] ?Post Procedural Pain: [10:N/Max] [3:Procedure was tolerated well] [8:Procedure was tolerated well] ?Debridement Treatment Response: [10:N/Max] [3:1.8x1.1x0.5] [8:3.6x3.4x1.4] ?Post Debridement Measurements L x ?W x [name] [10:N/Max] [3:0.778] [8:13.459] ?Post Debridement Volume: (cm?) [10:scabbed] [3:N/Max] [8:N/Max] ?Assessment Notes: [10:N/Max] [3:Debridement] [8:Debridement] ?Procedures Performed: [3:Soft Cast] ?Treatment Notes ?Electronic Signature(s) ?Signed: 07/30/2021 8:56:04 AM By: [name] MD FACS ?Signed: 08/02/2021 5:50:11 PM By: [name] RN, BSN ?Entered By: [name] on 07/30/2021 08:56:04 ?-------------------------------------------------------------------------------- ?Multi-Disciplinary Care Plan Details ?Patient Name: ?Date of Service: ?[name], [name] 07/30/2021 7:45 Max M ?Medical Record Number: [id number] ?Patient Account Number: [id number] ?Date of Birth/Sex: ?Treating RN: ?14-Apr-1985 (36 y.o. [name] ?Primary Care Qais Jowers: [name] ?Other Clinician: ?Referring Ramla Hase: ?Treating Duanna Runk/Extender: [name] ?[name], [name] ?Weeks in Treatment:  92 ?Multidisciplinary Care Plan reviewed with physician ?Active Inactive ?Nutrition ?Nursing Diagnoses: ?Imbalanced nutrition ?Potential for alteratiion in Nutrition/Potential for imbalanced nutrition ?Goals: ?Patie

## 2021-08-02 NOTE — Progress Notes (Signed)
[facility], [name] ([id number]) ?Visit Report for 07/30/2021 ?Chief Complaint Document Details ?Patient Name: Date of Service: ?[name], [name] 07/30/2021 7:45 A M ?Medical Record Number: [id number] ?Patient Account Number: [id number] ?Date of Birth/Sex: Treating RN: ?04-12-1985 (36 y.o. [name] ?Primary Care Provider: [name] Other Clinician: ?Referring Provider: ?Treating Provider/Extender: [name] ?[name], [name] ?Weeks in Treatment: 92 ?Information Obtained from: Patient ?Chief Complaint ?01/11/2019; patient is here for review of a rather substantial wound over the left fifth plantar metatarsal head extending into the lateral part of his foot ?10/24/2019; patient returns to clinic with wounds on his bilateral feet with underlying osteomyelitis biopsy-proven ?Electronic Signature(s) ?Signed: 07/30/2021 8:56:12 AM By: [name] MD FACS ?Entered By: [name] on 07/30/2021 08:56:12 ?-------------------------------------------------------------------------------- ?Debridement Details ?Patient Name: Date of Service: ?[name], [name] 07/30/2021 7:45 A M ?Medical Record Number: [id number] ?Patient Account Number: [id number] ?Date of Birth/Sex: Treating RN: ?08/01/1985 (36 y.o. [name] ?Primary Care Provider: [name] Other Clinician: ?Referring Provider: ?Treating Provider/Extender: [name] ?[name], [name] ?Weeks in Treatment: 92 ?Debridement Performed for Assessment: Wound #8 Right,Lateral Foot ?Performed By: Physician [name], MD ?Debridement Type: Debridement ?Severity of Tissue Pre Debridement: Muscle involvement without necrosis ?Level of Consciousness (Pre-procedure): Awake and Alert ?Pre-procedure Verification/Time Out Yes - 08:25 ?Taken: ?Start Time: 08:26 ?Pain Control: ?Other : benzocaine 20% spray ?T Area Debrided (L x W): ?otal 4 (cm) x 4.6 (cm) = 18.4 (cm?) ?Tissue and other material debrided: ?Viable, Non-Viable, Callus, Slough,  Subcutaneous, Skin: Epidermis, Slough ?Level: Skin/Subcutaneous Tissue ?Debridement Description: Excisional ?Instrument: Curette ?Bleeding: Minimum ?Hemostasis Achieved: Silver Nitrate ?Procedural Pain: 0 ?Post Procedural Pain: 0 ?Response to Treatment: Procedure was tolerated well ?Level of Consciousness (Post- Awake and Alert ?procedure): ?Post Debridement Measurements of Total Wound ?Length: (cm) 3.6 ?Width: (cm) 3.4 ?Depth: (cm) 1.4 ?Volume: (cm?) 13.459 ?Character of Wound/Ulcer Post Debridement: Improved ?Severity of Tissue Post Debridement: Muscle involvement without necrosis ?Post Procedure Diagnosis ?Same as Pre-procedure ?Electronic Signature(s) ?Signed: 07/30/2021 12:24:03 PM By: [name] MD FACS ?Signed: 07/30/2021 3:54:04 PM By: Baruch Gouty RN, BSN ?Entered By: Baruch Gouty on 07/30/2021 08:37:38 ?-------------------------------------------------------------------------------- ?Debridement Details ?Patient Name: ?Date of Service: ?[name], [name] 07/30/2021 7:45 A M ?Medical Record Number: [id number] ?Patient Account Number: [id number] ?Date of Birth/Sex: ?Treating RN: ?11-Apr-1985 (36 y.o. [name] ?Primary Care Provider: [name] ?Other Clinician: ?Referring Provider: ?Treating Provider/Extender: [name] ?[name], [name] ?Weeks in Treatment: 92 ?Debridement Performed for Assessment: Wound #3 Left,Lateral,Plantar Foot ?Performed By: Physician [name], MD ?Debridement Type: Debridement ?Severity of Tissue Pre Debridement: Fat layer exposed ?Level of Consciousness (Pre-procedure): Awake and Alert ?Pre-procedure Verification/Time Out Yes - 08:25 ?Taken: ?Start Time: 08:26 ?Pain Control: ?Other : benzocaine 20% spray ?T Area Debrided (L x W): ?otal 1.8 (cm) x 0.6 (cm) = 1.08 (cm?) ?Tissue and other material debrided: ?Viable, Non-Viable, Callus, Skin: Epidermis ?Level: Skin/Epidermis ?Debridement Description: Selective/Open Wound ?Instrument: Curette ?Bleeding:  Minimum ?Hemostasis Achieved: Pressure ?Procedural Pain: 0 ?Post Procedural Pain: 0 ?Response to Treatment: Procedure was tolerated well ?Level of Consciousness (Post- Awake and Alert ?procedure): ?Post Debridement Measurements of Total Wound ?Length: (cm) 1.8 ?Width: (cm) 1.1 ?Depth: (cm) 0.5 ?Volume: (cm?) 0.778 ?Character of Wound/Ulcer Post Debridement: Improved ?Severity of Tissue Post Debridement: Fat layer exposed ?Post Procedure Diagnosis ?Same as Pre-procedure ?Electronic Signature(s) ?Signed: 07/30/2021 12:24:03 PM By: [name] MD FACS ?Signed: 07/30/2021 3:54:04 PM By: Baruch Gouty RN, BSN ?Entered By: Baruch Gouty on 07/30/2021 08:39:17 ?-------------------------------------------------------------------------------- ?HPI Details ?Patient Name: Date  of Service: ?[name], [name] 07/30/2021 7:45 A M ?Medical Record Number: [id number] ?Patient Account Number: [id number] ?Date of Birth/Sex: Treating RN: ?Nov 02, 1985 (36 y.o. 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 He comes in for review of a wound over the plantar fifth metatarsal head ?extending into the lateral part of the foot. He was followed for this previously by his podiatrist Dr. Cornelius Moras. As the patient tells his story he went to see podiatry ?first for a swelling he developed on the lateral part of his fifth metatarsal head in May. He states this was "open" by podiatry and the area closed. He was ?followed up in June and it was again opened callus removed and it closed promptly. There were plans being made for surgery on the fifth metatarsal head in ?June however his blood sugar was apparently too high for anesthesia. Apparently the area was debrided  and opened again in June and it is never closed since. ?Looking over the records from podiatry I am really not able to follow this. It was clear when he was first seen it was before 5/14 at that point he already had a ?wound. By 5/17 the ulcer was resolved. I do not see anything about a procedure. On 5/28 noted to have pre-ulcerative moderate keratosis. X-ray noted 1/5 ?contracted toe and tailor's bunion and metatarsal deformity. On a visit date on 09/28/2018 the dorsal part of the left foot it healed and resolved. There was ?concern about swelling in his lower extremity he was sent to the ER.. As far as I can tell he was seen in the ER on 7/12 with an ulcer on his left foot. A DVT ?rule out of the left leg was negative. I do not think I have complete records from podiatry but I am not able to verify the procedures this patient states he had. ?He states after the last procedure the wound has never closed although I am not able to follow this in the records I have from podiatry. He has not had a ?recent x-ray ?The patient has been using Neosporin on the wound. He is wearing a Darco shoe. He is still very active up on his foot working and exercising. ?Past medical history; type 2 diabetes ketosis-prone, leg swelling with a negative DVT study in July. Non-smoker ?ABI in our clinic was 0.85 on the left ?10/16; substantial wound on the plantar left fifth met head extending laterally almost to the dorsal fifth MTP. We have been using silver alginate we gave him a ?Darco forefoot off loader. An x-ray did not show evidence of osteomyelitis did note soft tissue emphysema which I think was due to gas tracking through an ?open wound. There is no doubt in my mind he requires an MRI ?10/23; MRI not booked until 3 November at the earliest this is largely due to his glucose sensor in the right arm. We have been using silver alginate. There has ?been an improvement ?10/29; I am still not exactly sure when his MRI is booked for. He  says it is the third but it is the 10th in epic. This definitely needs to be done. He is running a ?low-grade fever today but no other symptoms. No real improvement in the 1 ?02/26/2019 patient presents today for a f

## 2021-08-03 ENCOUNTER — Encounter (HOSPITAL_BASED_OUTPATIENT_CLINIC_OR_DEPARTMENT_OTHER): Payer: BC Managed Care – PPO | Attending: General Surgery | Admitting: General Surgery

## 2021-08-06 ENCOUNTER — Encounter (HOSPITAL_BASED_OUTPATIENT_CLINIC_OR_DEPARTMENT_OTHER): Payer: BC Managed Care – PPO | Attending: General Surgery | Admitting: General Surgery

## 2021-08-06 DIAGNOSIS — M86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, right ankle and foot: Secondary | ICD-10-CM | POA: Diagnosis not present

## 2021-08-06 DIAGNOSIS — E1151 Type 2 diabetes mellitus with diabetic peripheral angiopathy without gangrene: Secondary | ICD-10-CM | POA: Insufficient documentation

## 2021-08-06 DIAGNOSIS — L97518 Non-pressure chronic ulcer of other part of right foot with other specified severity: Secondary | ICD-10-CM | POA: Diagnosis not present

## 2021-08-06 DIAGNOSIS — E11621 Type 2 diabetes mellitus with foot ulcer: Secondary | ICD-10-CM | POA: Insufficient documentation

## 2021-08-06 DIAGNOSIS — L97528 Non-pressure chronic ulcer of other part of left foot with other specified severity: Secondary | ICD-10-CM | POA: Insufficient documentation

## 2021-08-06 DIAGNOSIS — M86572 Other chronic hematogenous osteomyelitis, left ankle and foot: Secondary | ICD-10-CM | POA: Diagnosis not present

## 2021-08-06 DIAGNOSIS — Z8616 Personal history of COVID-19: Secondary | ICD-10-CM | POA: Diagnosis not present

## 2021-08-06 DIAGNOSIS — B9562 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 classified elsewhere: Secondary | ICD-10-CM | POA: Insufficient documentation

## 2021-08-06 NOTE — Progress Notes (Signed)
[name] ([id number]) ?Visit Report for 08/06/2021 ?Chief Complaint Document Details ?Patient Name: Date of Service: ?[name], [name] 08/06/2021 7:45 A M ?Medical Record Number: [id number] ?Patient Account Number: [id number] ?Date of Birth/Sex: Treating RN: ?Apr 21, 1985 (36 y.o. [name] ?Primary Care Provider: [name] Other Clinician: ?Referring Provider: ?Treating Provider/Extender: [name] ?[name], [name] ?Weeks in Treatment: 72 ?Information Obtained from: Patient ?Chief Complaint ?01/11/2019; patient is here for review of a rather substantial wound over the left fifth plantar metatarsal head extending into the lateral part of his foot ?10/24/2019; patient returns to clinic with wounds on his bilateral feet with underlying osteomyelitis biopsy-proven ?Electronic Signature(s) ?Signed: 08/06/2021 8:31:34 AM By: [name] MD FACS ?Entered By: [name] on 08/06/2021 08:31:33 ?-------------------------------------------------------------------------------- ?Debridement Details ?Patient Name: Date of Service: ?[name], [name] 08/06/2021 7:45 A M ?Medical Record Number: [id number] ?Patient Account Number: [id number] ?Date of Birth/Sex: Treating RN: ?[birth year] (36 y.o. [name] ?Primary Care Provider: [name] Other Clinician: ?Referring Provider: ?Treating Provider/Extender: [name] ?[name], [name] ?Weeks in Treatment: 67 ?Debridement Performed for Assessment: Wound #8 Right,Lateral Foot ?Performed By: Physician [name], MD ?Debridement Type: Debridement ?Severity of Tissue Pre Debridement: Fat layer exposed ?Level of Consciousness (Pre-procedure): Awake and Alert ?Pre-procedure Verification/Time Out Yes - 08:15 ?Taken: ?Start Time: 08:15 ?Pain Control: ?Other : benzocaine ?T Area Debrided (L x W): ?otal 3.9 (cm) x 4.1 (cm) = 15.99 (cm?) ?Tissue and other material debrided: ?Viable, Non-Viable, Callus, Slough, Subcutaneous, Skin: Epidermis,  Slough ?Level: Skin/Subcutaneous Tissue ?Debridement Description: Excisional ?Instrument: Curette ?Bleeding: Minimum ?Hemostasis Achieved: Pressure ?Procedural Pain: 0 ?Post Procedural Pain: 0 ?Response to Treatment: Procedure was tolerated well ?Level of Consciousness (Post- Awake and Alert ?procedure): ?Post Debridement Measurements of Total Wound ?Length: (cm) 3.9 ?Width: (cm) 4.1 ?Depth: (cm) 1.1 ?Volume: (cm?) 13.814 ?Character of Wound/Ulcer Post Debridement: Improved ?Severity of Tissue Post Debridement: Fat layer exposed ?Post Procedure Diagnosis ?Same as Pre-procedure ?Electronic Signature(s) ?Signed: 08/06/2021 12:24:54 PM By: [name] MD FACS ?Signed: 08/06/2021 2:04:38 PM By: Adline Peals ?Entered By: Adline Peals on 08/06/2021 08:19:12 ?-------------------------------------------------------------------------------- ?Debridement Details ?Patient Name: ?Date of Service: ?[name], [name] 08/06/2021 7:45 A M ?Medical Record Number: [id number] ?Patient Account Number: [id number] ?Date of Birth/Sex: ?Treating RN: ?February 14, 1986 (36 y.o. [name] ?Primary Care Provider: [name] ?Other Clinician: ?Referring Provider: ?Treating Provider/Extender: [name] ?[name], [name] ?Weeks in Treatment: 58 ?Debridement Performed for Assessment: Wound #3 Left,Lateral,Plantar Foot ?Performed By: Physician [name], MD ?Debridement Type: Debridement ?Severity of Tissue Pre Debridement: Fat layer exposed ?Level of Consciousness (Pre-procedure): Awake and Alert ?Pre-procedure Verification/Time Out Yes - 08:15 ?Taken: ?Start Time: 08:15 ?Pain Control: ?Other : benzocaine ?T Area Debrided (L x W): ?otal 1.8 (cm) x 1.5 (cm) = 2.7 (cm?) ?Tissue and other material debrided: ?Viable, Non-Viable, Callus, Slough, Subcutaneous, Skin: Epidermis, Slough ?Level: Skin/Subcutaneous Tissue ?Debridement Description: Excisional ?Instrument: Curette ?Bleeding: Minimum ?Hemostasis Achieved:  Pressure ?Procedural Pain: 0 ?Post Procedural Pain: 0 ?Response to Treatment: Procedure was tolerated well ?Level of Consciousness (Post- Awake and Alert ?procedure): ?Post Debridement Measurements of Total Wound ?Length: (cm) 1.8 ?Width: (cm) 1.5 ?Depth: (cm) 0.3 ?Volume: (cm?) 0.636 ?Character of Wound/Ulcer Post Debridement: Improved ?Severity of Tissue Post Debridement: Fat layer exposed ?Post Procedure Diagnosis ?Same as Pre-procedure ?Electronic Signature(s) ?Signed: 08/06/2021 12:24:54 PM By: [name] MD FACS ?Signed: 08/06/2021 2:04:38 PM By: Adline Peals ?Entered By: Adline Peals on 08/06/2021 08:20:24 ?-------------------------------------------------------------------------------- ?HPI Details ?Patient Name: Date of Service: ?[name], [name] 08/06/2021  7:45 A M ?Medical Record Number: [id number] ?Patient Account Number: [id number] ?Date of Birth/Sex: Treating RN: ?[birth year] (36 y.o. 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 He comes in for review of a wound over the plantar fifth metatarsal head ?extending into the lateral part of the foot. He was followed for this previously by his podiatrist Dr. Cornelius Moras. As the patient tells his story he went to see podiatry ?first for a swelling he developed on the lateral part of his fifth metatarsal head in May. He states this was "open" by podiatry and the area closed. He was ?followed up in June and it was again opened callus removed and it closed promptly. There were plans being made for surgery on the fifth metatarsal head in ?June however his blood sugar was apparently too high for anesthesia. Apparently the area was debrided and opened again in June and it is  never closed since. ?Looking over the records from podiatry I am really not able to follow this. It was clear when he was first seen it was before 5/14 at that point he already had a ?wound. By 5/17 the ulcer was resolved. I do not see anything about a procedure. On 5/28 noted to have pre-ulcerative moderate keratosis. X-ray noted 1/5 ?contracted toe and tailor's bunion and metatarsal deformity. On a visit date on 09/28/2018 the dorsal part of the left foot it healed and resolved. There was ?concern about swelling in his lower extremity he was sent to the ER.. As far as I can tell he was seen in the ER on 7/12 with an ulcer on his left foot. A DVT ?rule out of the left leg was negative. I do not think I have complete records from podiatry but I am not able to verify the procedures this patient states he had. ?He states after the last procedure the wound has never closed although I am not able to follow this in the records I have from podiatry. He has not had a ?recent x-ray ?The patient has been using Neosporin on the wound. He is wearing a Darco shoe. He is still very active up on his foot working and exercising. ?Past medical history; type 2 diabetes ketosis-prone, leg swelling with a negative DVT study in July. Non-smoker ?ABI in our clinic was 0.85 on the left ?10/16; substantial wound on the plantar left fifth met head extending laterally almost to the dorsal fifth MTP. We have been using silver alginate we gave him a ?Darco forefoot off loader. An x-ray did not show evidence of osteomyelitis did note soft tissue emphysema which I think was due to gas tracking through an ?open wound. There is no doubt in my mind he requires an MRI ?10/23; MRI not booked until 3 November at the earliest this is largely due to his glucose sensor in the right arm. We have been using silver alginate. There has ?been an improvement ?10/29; I am still not exactly sure when his MRI is booked for. He says it is the third but it is the  10th in epic. This definitely needs to be done. He is running a ?low-grade fever today but no other symptoms. No real improvement in the 1 ?02/26/2019 patient presents today for a follow-up visit here in our clinic he i

## 2021-08-06 NOTE — Progress Notes (Signed)
[name], [name] ([id number]) ?Visit Report for 08/06/2021 ?Arrival Information Details ?Patient Name: Date of Service: ?[name], [name] 08/06/2021 7:45 A M ?Medical Record Number: [id number] ?Patient Account Number: [id number] ?Date of Birth/Sex: Treating RN: ?1985-06-05 (35 y.o. [name] ?Primary Care [name]: [name] Other Clinician: ?Referring [name]: ?Treating [name]: [name] ?[name], [name] ?Weeks in Treatment: 86 ?Visit Information History Since Last Visit ?Added or deleted any medications: No ?Patient Arrived: Ambulatory ?Any new allergies or adverse reactions: No ?Arrival Time: 07:49 ?Had a fall or experienced change in No ?Accompanied By: self ?activities of daily living that may affect ?Transfer Assistance: None ?risk of falls: ?Patient Identification Verified: Yes ?Signs or symptoms of abuse/neglect since last No ?Secondary Verification Process Completed: Yes visito ?Patient Requires Transmission-Based Precautions: No ?Hospitalized since last visit: No ?Patient Has Alerts: No ?Implantable device outside of the clinic No ?excluding ?cellular tissue based products placed in the ?center ?since last visit: ?Has Dressing in Place as Prescribed: No ?Has Footwear/Offloading in Place as Prescribed: No ?Right: Removable Cast Walker/Walking Boot ?Pain Present Now: No ?Electronic Signature(s) ?Signed: 08/06/2021 2:04:38 PM By: Adline Peals ?Entered By: Adline Peals on 08/06/2021 07:53:05 ?-------------------------------------------------------------------------------- ?Encounter Discharge Information Details ?Patient Name: Date of Service: ?[name], [name] 08/06/2021 7:45 A M ?Medical Record Number: [id number] ?Patient Account Number: [id number] ?Date of Birth/Sex: Treating RN: ?11/04/1985 (36 y.o. [name] ?Primary Care [name]: [name] Other Clinician: ?Referring [name]: ?Treating [name]: [name] ?[name], [name] ?Weeks in  Treatment: 40 ?Encounter Discharge Information Items Post Procedure Vitals ?Discharge Condition: Stable ?Temperature (F): 98.5 ?Ambulatory Status: Ambulatory ?Pulse (bpm): 90 ?Discharge Destination: Home ?Respiratory Rate (breaths/min): 18 ?Transportation: Private Auto ?Blood Pressure (mmHg): 141/87 ?Accompanied By: self ?Schedule Follow-up Appointment: Yes ?Clinical Summary of Care: Patient Declined ?Electronic Signature(s) ?Signed: 08/06/2021 2:04:38 PM By: Adline Peals ?Entered By: Adline Peals on 08/06/2021 09:03:19 ?-------------------------------------------------------------------------------- ?Lower Extremity Assessment Details ?Patient Name: ?Date of Service: ?[name], [name] 08/06/2021 7:45 A M ?Medical Record Number: [id number] ?Patient Account Number: [id number] ?Date of Birth/Sex: ?Treating RN: ?[birth year] (36 y.o. [name] ?Primary Care [name]: [name] ?Other Clinician: ?Referring [name]: ?Treating [name]: [name] ?[name], [name] ?Weeks in Treatment: 61 ?Edema Assessment ?Assessed: [Left: No] [Right: No] ?Edema: [Left: Yes] [Right: Yes] ?Calf ?Left: Right: ?Point of Measurement: 48 cm From Medial Instep 46.7 cm 50.1 cm ?Ankle ?Left: Right: ?Point of Measurement: 11 cm From Medial Instep 29 cm 29.3 cm ?Vascular Assessment ?Pulses: ?Dorsalis Pedis ?Palpable: [Left:Yes] [Right:Yes] ?Electronic Signature(s) ?Signed: 08/06/2021 2:04:38 PM By: Adline Peals ?Entered By: Adline Peals on 08/06/2021 08:00:33 ?-------------------------------------------------------------------------------- ?Multi Wound Chart Details ?Patient Name: ?Date of Service: ?[name], [name] 08/06/2021 7:45 A M ?Medical Record Number: [id number] ?Patient Account Number: [id number] ?Date of Birth/Sex: ?Treating RN: ?1985-04-07 (36 y.o. [name] ?Primary Care [name]: [name] ?Other Clinician: ?Referring [name]: ?Treating [name]: [name] ?[name], [name] ?Weeks in Treatment: 87 ?Vital Signs ?Height(in): 77 ?Capillary Blood Glucose(mg/dl): 117 ?Weight(lbs): 280 ?Pulse(bpm): 90 ?Body Mass Index(BMI): 33.2 ?Blood Pressure(mmHg): 141/87 ?Temperature(??F): 98.5 ?Respiratory Rate(breaths/min): 18 ?Photos: [3:Left, Lateral, Plantar Foot] [8:Right, Lateral Foot] [N/A:N/A N/A] ?Wound Location: [3:Trauma] [8:Blister] [N/A:N/A] ?Wounding Event: [3:Diabetic Wound/Ulcer of the Lower] [8:Diabetic Wound/Ulcer of the Lower] [N/A:N/A] ?Primary Etiology: [3:Extremity Type II Diabetes] [8:Extremity Type II Diabetes] [N/A:N/A] ?Comorbid History: [3:10/02/2019] [8:04/18/2021] [N/A:N/A] ?Date Acquired: [3:93] [8:15] [N/A:N/A] ?Weeks of Treatment: [3:Open] [8:Open] [N/A:N/A] ?Wound Status: [3:No] [8:No] [N/A:N/A] ?Wound Recurrence: [3:1.8x1.5x0.3] [8:3.9x4.1x1.1] [N/A:N/A] ?Measurements L x W x D (cm) [3:2.121] [8:12.559] [  N/A:N/A] ?A (cm?) : ?rea [3:0.636] [8:13.814] [N/A:N/A] ?Volume (cm?) : [3:-28.60%] [8:-2183.50%] [N/A:N/A] ?% Reduction in A [3:rea: -285.50%] [8:-12458.20%] [N/A:N/A] ?% Reduction in Volume: [3:9] [8:6] ?Starting Position 1 (o'clock): [3:3] [8:9] ?Ending Position 1 (o'clock): [3:0.3] [8:0.2] ?Maximum Distance 1 (cm): [3:Yes] [8:Yes] [N/A:N/A] ?Undermining: [3:Grade 2] [8:Grade 2] [N/A:N/A] ?Classification: [3:Medium] [8:Large] [N/A:N/A] ?Exudate A mount: [3:Serosanguineous] [8:Serosanguineous] [N/A:N/A] ?Exudate Type: [3:red, brown] [8:red, brown] [N/A:N/A] ?Exudate Color: [3:Thickened] [8:Well defined, not attached] [N/A:N/A] ?Wound Margin: [3:Large (67-100%)] [8:Large (67-100%)] [N/A:N/A] ?Granulation A mount: [3:Red, Pink] [8:Red, Pink] [N/A:N/A] ?Granulation Quality: [3:None Present (0%)] [8:Small (1-33%)] [N/A:N/A] ?Necrotic A mount: [3:N/A] [8:Eschar, Adherent Slough] [N/A:N/A] ?Necrotic Tissue: ?[3:Fat Layer (Subcutaneous Tissue): Yes Fat Layer (Subcutaneous Tissue): Yes N/A] ?Exposed Structures: ?[3:Fascia: No Tendon: No Muscle: No  Joint: No Bone: No Small (1-33%)] [8:Muscle: Yes Fascia: No Tendon: No Joint: No Bone: No Small (1-33%)] [N/A:N/A] ?Epithelialization: [3:Debridement - Excisional] [8:Debridement - Excisional] [N/A:N/A] ?Debridement: ?Pre-procedure Verification/Time Out 08:15 [8:08:15] [N/A:N/A] ?Taken: [3:Other] [8:Other] [N/A:N/A] ?Pain Control: [3:Callus, Subcutaneous, Slough] [8:Callus, Subcutaneous, Slough] [N/A:N/A] ?Tissue Debrided: [3:Skin/Subcutaneous Tissue] [8:Skin/Subcutaneous Tissue] [N/A:N/A] ?Level: [3:2.7] [8:15.99] [N/A:N/A] ?Debridement A (sq cm): [3:rea Curette] [8:Curette] [N/A:N/A] ?Instrument: [3:Minimum] [8:Minimum] [N/A:N/A] ?Bleeding: [3:Pressure] [8:Pressure] [N/A:N/A] ?Hemostasis A chieved: [3:0] [8:0] [N/A:N/A] ?Procedural Pain: [3:0] [8:0] [N/A:N/A] ?Post Procedural Pain: [3:Procedure was tolerated well] [8:Procedure was tolerated well] [N/A:N/A] ?Debridement Treatment Response: [3:1.8x1.5x0.3] [8:3.9x4.1x1.1] [N/A:N/A] ?Post Debridement Measurements L x ?W x D (cm) [3:0.636] [8:13.814] [N/A:N/A] ?Post Debridement Volume: (cm?) [3:Debridement] [8:Debridement] [N/A:N/A] ?Treatment Notes ?Electronic Signature(s) ?Signed: 08/06/2021 8:31:25 AM By: [name] MD FACS ?Signed: 08/06/2021 1:22:09 PM By: [name] RN, BSN ?Entered By: [name] on 08/06/2021 08:31:25 ?-------------------------------------------------------------------------------- ?Multi-Disciplinary Care Plan Details ?Patient Name: ?Date of Service: ?[name], [name] 08/06/2021 7:45 A M ?Medical Record Number: [id number] ?Patient Account Number: [id number] ?Date of Birth/Sex: ?Treating RN: ?September 17, 1985 (36 y.o. [name] ?Primary Care Jamarea Selner: [name] ?Other Clinician: ?Referring Ruthene Methvin: ?Treating Leslea Vowles/Extender: [name] ?[name], [name] ?Weeks in Treatment: 75 ?Multidisciplinary Care Plan reviewed with physician ?Active Inactive ?Nutrition ?Nursing Diagnoses: ?Imbalanced nutrition ?Potential for  alteratiion in Nutrition/Potential for imbalanced nutrition ?Goals: ?Patient/caregiver agrees to and verbalizes understanding of need to use nutritional supplements and/or vitamins as prescribed ?Date Initiated: 10/24/2019 ?Date

## 2021-08-13 ENCOUNTER — Encounter (HOSPITAL_BASED_OUTPATIENT_CLINIC_OR_DEPARTMENT_OTHER): Payer: BC Managed Care – PPO | Admitting: General Surgery

## 2021-08-13 DIAGNOSIS — E11621 Type 2 diabetes mellitus with foot ulcer: Secondary | ICD-10-CM | POA: Diagnosis not present

## 2021-08-16 NOTE — Progress Notes (Signed)
[name], [name] ([id number]) ?Visit Report for 08/13/2021 ?Arrival Information Details ?Patient Name: Date of Service: ?[name], [name] 08/13/2021 3:45 PM ?Medical Record Number: [id number] ?Patient Account Number: [id number] ?Date of Birth/Sex: Treating RN: ?03/22/1986 (35 y.o. M) Lynch, Shatara ?Primary Care Provider: [name], Ronald Other Clinician: ?Referring Provider: ?Treating Provider/Extender: Cannon, Jennifer ?[name], Ronald ?Weeks in Treatment: 94 ?Visit Information History Since Last Visit ?Added or deleted any medications: No ?Patient Arrived: Ambulatory ?Any new allergies or adverse reactions: No ?Arrival Time: 16:07 ?Had a fall or experienced change in No ?Accompanied By: alone ?activities of daily living that may affect ?Transfer Assistance: None ?risk of falls: ?Patient Identification Verified: Yes ?Signs or symptoms of abuse/neglect since last visito No ?Secondary Verification Process Completed: Yes ?Hospitalized since last visit: No ?Patient Requires Transmission-Based Precautions: No ?Implantable device outside of the clinic excluding No ?Patient Has Alerts: No ?cellular tissue based products placed in the center ?since last visit: ?Has Dressing in Place as Prescribed: Yes ?Pain Present Now: No ?Electronic Signature(s) ?Signed: 08/13/2021 5:54:45 PM By: Lynch, Shatara RN, BSN ?Entered By: Lynch, Shatara on 08/13/2021 16:08:32 ?-------------------------------------------------------------------------------- ?Encounter Discharge Information Details ?Patient Name: Date of Service: ?[name], [name] 08/13/2021 3:45 PM ?Medical Record Number: [id number] ?Patient Account Number: [id number] ?Date of Birth/Sex: Treating RN: ?02/02/1986 (35 y.o. M) Lynch, Shatara ?Primary Care Provider: [name], Ronald Other Clinician: ?Referring Provider: ?Treating Provider/Extender: Cannon, Jennifer ?[name], Ronald ?Weeks in Treatment: 94 ?Encounter Discharge Information Items Post Procedure Vitals ?Discharge Condition:  Stable ?Temperature (F): 98.5 ?Ambulatory Status: Ambulatory ?Pulse (bpm): 93 ?Discharge Destination: Home ?Respiratory Rate (breaths/min): 18 ?Transportation: Private Auto ?Blood Pressure (mmHg): 137/84 ?Accompanied By: alone ?Schedule Follow-up Appointment: Yes ?Clinical Summary of Care: Patient Declined ?Electronic Signature(s) ?Signed: 08/13/2021 5:54:45 PM By: Lynch, Shatara RN, BSN ?Entered By: Lynch, Shatara on 08/13/2021 17:43:01 ?-------------------------------------------------------------------------------- ?Lower Extremity Assessment Details ?Patient Name: ?Date of Service: ?[name], [name] 08/13/2021 3:45 PM ?Medical Record Number: [id number] ?Patient Account Number: [id number] ?Date of Birth/Sex: ?Treating RN: ?10/29/1985 (35 y.o. M) Lynch, Shatara ?Primary Care Provider: [name], Ronald ?Other Clinician: ?Referring Provider: ?Treating Provider/Extender: Cannon, Jennifer ?[name], Ronald ?Weeks in Treatment: 94 ?Edema Assessment ?Assessed: [Left: No] [Right: No] ?Edema: [Left: Yes] [Right: Yes] ?Calf ?Left: Right: ?Point of Measurement: 48 cm From Medial Instep 46.7 cm 50 cm ?Ankle ?Left: Right: ?Point of Measurement: 11 cm From Medial Instep 29 cm 29 cm ?Vascular Assessment ?Pulses: ?Dorsalis Pedis ?Palpable: [Left:Yes] [Right:Yes] ?Electronic Signature(s) ?Signed: 08/13/2021 5:54:45 PM By: Lynch, Shatara RN, BSN ?Entered By: Lynch, Shatara on 08/13/2021 16:11:18 ?-------------------------------------------------------------------------------- ?Multi Wound Chart Details ?Patient Name: ?Date of Service: ?[name], [name] 08/13/2021 3:45 PM ?Medical Record Number: [id number] ?Patient Account Number: [id number] ?Date of Birth/Sex: ?Treating RN: ?09/18/1985 (35 y.o. M) Herrington, Taylor ?Primary Care Provider: [name], Ronald ?Other Clinician: ?Referring Provider: ?Treating Provider/Extender: Cannon, Jennifer ?[name], Ronald ?Weeks in Treatment: 94 ?Vital Signs ?Height(in): 77 ?Capillary Blood Glucose(mg/dl):  120 ?Weight(lbs): 280 ?Pulse(bpm): 93 ?Body Mass Index(BMI): 33.2 ?Blood Pressure(mmHg): 137/84 ?Temperature(??F): 98.5 ?Respiratory Rate(breaths/min): 18 ?Photos: [3:Left, Lateral, Plantar Foot] [8:Right, Lateral Foot] [N/A:N/A N/A] ?Wound Location: [3:Trauma] [8:Blister] [N/A:N/A] ?Wounding Event: [3:Diabetic Wound/Ulcer of the Lower] [8:Diabetic Wound/Ulcer of the Lower] [N/A:N/A] ?Primary Etiology: [3:Extremity Type II Diabetes] [8:Extremity Type II Diabetes] [N/A:N/A] ?Comorbid History: [3:10/02/2019] [8:04/18/2021] [N/A:N/A] ?Date Acquired: [3:94] [8:16] [N/A:N/A] ?Weeks of Treatment: [3:Open] [8:Open] [N/A:N/A] ?Wound Status: [3:No] [8:No] [N/A:N/A] ?Wound Recurrence: [3:1.8x2x0.4] [8:4x3.8x0.8] [N/A:N/A] ?Measurements L x W x D (cm) [3:2.827] [8:11.938] [N/A:N/A] ?A (cm?) : ?rea [3:1.131] [8:9.55] [N/A:N/A] ?Volume (cm?) : [  3:-71.40%] [8:-2070.50%] [N/A:N/A] ?% Reduction in A [3:rea: -585.50%] [8:-8581.80%] [N/A:N/A] ?% Reduction in Volume: [3:12] [8:4] ?Starting Position 1 (o'clock): [3:12] [8:7] ?Ending Position 1 (o'clock): [3:2.5] [8:0.7] ?Maximum Distance 1 (cm): [3:Yes] [8:Yes] [N/A:N/A] ?Undermining: [3:Grade 2] [8:Grade 2] [N/A:N/A] ?Classification: [3:Medium] [8:Large] [N/A:N/A] ?Exudate A mount: [3:Serosanguineous] [8:Serosanguineous] [N/A:N/A] ?Exudate Type: [3:red, brown] [8:red, brown] [N/A:N/A] ?Exudate Color: [3:Well defined, not attached] [8:Well defined, not attached] [N/A:N/A] ?Wound Margin: [3:Large (67-100%)] [8:Large (67-100%)] [N/A:N/A] ?Granulation A mount: [3:Red, Pink] [8:Red, Pink] [N/A:N/A] ?Granulation Quality: [3:None Present (0%)] [8:Small (1-33%)] [N/A:N/A] ?Necrotic A mount: ?[3:Fat Layer (Subcutaneous Tissue): Yes Fat Layer (Subcutaneous Tissue): Yes N/A] ?Exposed Structures: ?[3:Fascia: No Tendon: No Muscle: No Joint: No Bone: No Small (1-33%)] [8:Muscle: Yes Fascia: No Tendon: No Joint: No Bone: No Small (1-33%)] [N/A:N/A] ?Epithelialization: [3:Debridement - Excisional]  [8:N/A] [N/A:N/A] ?Debridement: ?Pre-procedure Verification/Time Out 16:41 [8:N/A] [N/A:N/A] ?Taken: [3:Callus, Subcutaneous, Slough] [8:N/A] [N/A:N/A] ?Tissue Debrided: [3:Skin/Subcutaneous Tissue] [8:N/A] [N/A:N/A] ?Level: [3:9] [8:N/A] [N/A:N/A] ?Debridement A (sq cm): [3:rea Curette] [8:N/A] [N/A:N/A] ?Instrument: [3:Minimum] [8:N/A] [N/A:N/A] ?Bleeding: [3:Pressure] [8:N/A] [N/A:N/A] ?Hemostasis A chieved: [3:0] [8:N/A] [N/A:N/A] ?Procedural Pain: [3:0] [8:N/A] [N/A:N/A] ?Post Procedural Pain: [3:Procedure was tolerated well] [8:N/A] [N/A:N/A] ?Debridement Treatment Response: [3:1.8x2x0.4] [8:N/A] [N/A:N/A] ?Post Debridement Measurements L x ?W x D (cm) [3:1.131] [8:N/A] [N/A:N/A] ?Post Debridement Volume: (cm?) [3:Debridement] [8:N/A] [N/A:N/A] ?Treatment Notes ?Electronic Signature(s) ?Signed: 08/13/2021 5:02:19 PM By: [name] MD FACS ?Signed: 08/16/2021 5:51:04 PM By: Adline Peals ?Entered By: [name] on 08/13/2021 17:02:19 ?-------------------------------------------------------------------------------- ?Multi-Disciplinary Care Plan Details ?Patient Name: ?Date of Service: ?[name] 08/13/2021 3:45 PM ?Medical Record Number: [id number] ?Patient Account Number: [id number] ?Date of Birth/Sex: ?Treating RN: ?01-30-1986 (36 y.o. [name] ?Primary Care Shaheim Mahar: [name] ?Other Clinician: ?Referring Glanda Spanbauer: ?Treating Braxtyn Bojarski/Extender: [name] ?[name], [name] ?Weeks in Treatment: 84 ?Multidisciplinary Care Plan reviewed with physician ?Active Inactive ?Nutrition ?Nursing Diagnoses: ?Imbalanced nutrition ?Potential for alteratiion in Nutrition/Potential for imbalanced nutrition ?Goals: ?Patient/caregiver agrees to and verbalizes understanding of need to use nutritional supplements and/or vitamins as prescribed ?Date Initiated: 10/24/2019 ?Date Inactivated: 04/06/2020 ?Target Resolution Date: 04/03/2020 ?Goal Status: Met ?Patient/caregiver will maintain  therapeutic glucose control ?Date Initiated: 10/24/2019 ?Target Resolution Date: 09/01/2021 ?Goal Status: Active ?Interventions: ?Assess HgA1c results as ordered upon admission and as needed ?Assess patient nutrition upon admi

## 2021-08-16 NOTE — Progress Notes (Signed)
Motz, [name] ([id number]) ?Visit Report for 08/13/2021 ?Chief Complaint Document Details ?Patient Name: Date of Service: ?[name] 08/13/2021 3:45 PM ?Medical Record Number: [id number] ?Patient Account Number: [id number] ?Date of Birth/Sex: Treating RN: ?[birth year] (36 y.o. [name] ?Primary Care Provider: [name] Other Clinician: ?Referring Provider: ?Treating Provider/Extender: [name] ?[name], [name] ?Weeks in Treatment: 61 ?Information Obtained from: Patient ?Chief Complaint ?01/11/2019; patient is here for review of a rather substantial wound over the left fifth plantar metatarsal head extending into the lateral part of his foot ?10/24/2019; patient returns to clinic with wounds on his bilateral feet with underlying osteomyelitis biopsy-proven ?Electronic Signature(s) ?Signed: 08/13/2021 5:03:29 PM By: [name] MD FACS ?Entered By: [name] on 08/13/2021 17:03:29 ?-------------------------------------------------------------------------------- ?Debridement Details ?Patient Name: Date of Service: ?[name] 08/13/2021 3:45 PM ?Medical Record Number: [id number] ?Patient Account Number: [id number] ?Date of Birth/Sex: Treating RN: ?09-04-1985 (36 y.o. [name] ?Primary Care Provider: [name] Other Clinician: ?Referring Provider: ?Treating Provider/Extender: [name] ?[name], [name] ?Weeks in Treatment: 77 ?Debridement Performed for Assessment: Wound #3 Left,Lateral,Plantar Foot ?Performed By: Physician [name], MD ?Debridement Type: Debridement ?Severity of Tissue Pre Debridement: Fat layer exposed ?Level of Consciousness (Pre-procedure): Awake and Alert ?Pre-procedure Verification/Time Out Yes - 16:41 ?Taken: ?Start Time: 16:41 ?T Area Debrided (L x W): ?otal 3 (cm) x 3 (cm) = 9 (cm?) ?Tissue and other material debrided: Non-Viable, Callus, Slough, Subcutaneous, Slough ?Level: Skin/Subcutaneous Tissue ?Debridement Description:  Excisional ?Instrument: Curette ?Bleeding: Minimum ?Hemostasis Achieved: Pressure ?End Time: 16:46 ?Procedural Pain: 0 ?Post Procedural Pain: 0 ?Response to Treatment: Procedure was tolerated well ?Level of Consciousness (Post- Awake and Alert ?procedure): ?Post Debridement Measurements of Total Wound ?Length: (cm) 1.8 ?Width: (cm) 2 ?Depth: (cm) 0.4 ?Volume: (cm?) 1.131 ?Character of Wound/Ulcer Post Debridement: Stable ?Severity of Tissue Post Debridement: Fat layer exposed ?Post Procedure Diagnosis ?Same as Pre-procedure ?Electronic Signature(s) ?Signed: 08/13/2021 5:18:00 PM By: [name] MD FACS ?Signed: 08/13/2021 5:54:45 PM By: [name] RN, BSN ?Entered By: [name] on 08/13/2021 16:47:40 ?-------------------------------------------------------------------------------- ?HPI Details ?Patient Name: Date of Service: ?[name] 08/13/2021 3:45 PM ?Medical Record Number: [id number] ?Patient Account Number: [id number] ?Date of Birth/Sex: Treating RN: ?05-Jul-1985 (36 y.o. 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 He comes in for review of a wound over the plantar fifth metatarsal head ?extending into the lateral part of the foot. He was followed for this previously by his podiatrist Dr. Cornelius Moras. As the patient tells his story he went to see podiatry ?first for a swelling he developed on the lateral part of his fifth metatarsal head in May. He states this was "open" by podiatry and the area closed. He was ?followed up in June and it was again opened callus removed and it closed promptly. There were plans being made for surgery on the fifth metatarsal head in ?June however his blood sugar was apparently too  high for anesthesia. Apparently the area was debrided and opened again in June and it is never closed since. ?Looking over the records from podiatry I am really not able to follow this. It was clear when he was first seen it was before 5/14 at that point he already had a ?wound. By 5/17 the ulcer was resolved. I  do not see anything about a procedure. On 5/28 noted to have pre-ulcerative moderate keratosis. X-ray noted 1/5 ?contracted toe and tailor's bunion and metatarsal deformity. On a visit date on 09/28/2018 the dorsal part of the left foot it healed and resolved. There was ?concern about swelling in his lower extremity he was sent to the ER.. As far as I can tell he was seen in the ER on 7/12 with an ulcer on his left foot. A DVT ?rule out of the left leg was negative. I do not think I have complete records from podiatry but I am not able to verify the procedures this patient states he had. ?He states after the last procedure the wound has never closed although I am not able to follow this in the records I have from podiatry. He has not had a ?recent x-ray ?The patient has been using Neosporin on the wound. He is wearing a Darco shoe. He is still very active up on his foot working and exercising. ?Past medical history; type 2 diabetes ketosis-prone, leg swelling with a negative DVT study in July. Non-smoker ?ABI in our clinic was 0.85 on the left ?10/16; substantial wound on the plantar left fifth met head extending laterally almost to the dorsal fifth MTP. We have been using silver alginate we gave him a ?Darco forefoot off loader. An x-ray did not show evidence of osteomyelitis did note soft tissue emphysema which I think was due to gas tracking through an ?open wound. There is no doubt in my mind he requires an MRI ?10/23; MRI not booked until 3 November at the earliest this is largely due to his glucose sensor in the right arm. We have been using silver alginate. There has ?been an improvement ?10/29; I am  still not exactly sure when his MRI is booked for. He says it is the third but it is the 10th in epic. This definitely needs to be done. He is running a ?low-grade fever today but no other symptoms. No real improvement in the 1 ?02/26/2019 patient presents today for a follow-up visit here in our clinic he is last been seen in the clinic on October 29. Subsequently we were working on ?getting MRI to evaluate and see what exactly was going on and where we would need to go from the standpoint of whether or not he had osteomyelitis and ?again what treatments were going be required. Subsequently the patient ended up being admitted to the hospital on 02/07/2019 and was discharged on ?02/14/2019. This is a somewhat interesting admission with a discharge diagnosis of pneumonia due to COVID-19 although he was positive for COVID-19 when ?tested at the urgent care but negative x2 when he was actually in the hospital. With that being said he did have acute respiratory failure with hypoxia and it was ?noted he also have a left foot ulceration with osteomyelitis. With that being said he did require oxygen for his pneumonia and I level 4 L. He was placed on ?antivirals and steroids for the COVID-19. He was also transferred to the [facility] at one point. Nonetheless he did subsequently discharged home ?and since being home has done much better in that regard. The CT angiogram did not show any pulmonary embolism. With regard to the osteomyelitis the ?patient was placed on vancomycin and Zosyn while in the hospital but has been changed to Augmentin at discharge. It was also recommended that he follow- ?up with wound care and podiatry. Podiatry however wanted him to  see [name] according to the patient prior to them doing anything further. His hemoglobin A1c was ?9.9 as noted in the hospital. Have an MRI of the left foot performed while in the hospital on 02/04/2019. 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 There is an overlying plantar open wound noted an abscess tracking back ?along the lateral aspect of the fifth metatarsal shaf

## 2021-08-20 ENCOUNTER — Encounter (HOSPITAL_BASED_OUTPATIENT_CLINIC_OR_DEPARTMENT_OTHER): Payer: BC Managed Care – PPO | Admitting: General Surgery

## 2021-08-20 DIAGNOSIS — E11621 Type 2 diabetes mellitus with foot ulcer: Secondary | ICD-10-CM | POA: Diagnosis not present

## 2021-08-23 NOTE — Progress Notes (Signed)
[name], [name] ([id number]) Visit Report for 08/20/2021 Arrival Information Details Patient Name: Date of Service: [name] 08/20/2021 2:15 PM Medical Record Number: [id number] Patient Account Number: [id number] Date of Birth/Sex: Treating RN: Dec 19, 1985 (36 y.o. [name] Primary Care [name]: [name] Other Clinician: Referring Camber Ninh: Treating [name]/Extender: [name] in Treatment: 95 Visit Information History Since Last Visit Added or deleted any medications: No Patient Arrived: Ambulatory Any new allergies or adverse reactions: No Arrival Time: 14:22 Had a fall or experienced change in No Accompanied By: self activities of daily living that may affect Transfer Assistance: None risk of falls: Patient Requires Transmission-Based Precautions: No Signs or symptoms of abuse/neglect since last visito No Patient Has Alerts: No Hospitalized since last visit: No Implantable device outside of the clinic excluding No cellular tissue based products placed in the center since last visit: Has Dressing in Place as Prescribed: Yes Pain Present Now: No Electronic Signature(s) Signed: 08/20/2021 3:21:06 PM By: Adline Peals Entered By: Adline Peals on 08/20/2021 14:23:56 -------------------------------------------------------------------------------- Encounter Discharge Information Details Patient Name: Date of Service: [name], [name] 08/20/2021 2:15 PM Medical Record Number: [id number] Patient Account Number: [id number] Date of Birth/Sex: Treating RN: 01-03-1986 (36 y.o. [name] Primary Care Ifrah Vest: [name] Other Clinician: Referring Wilborn Membreno: Treating Jaquise Faux/Extender: [name] in Treatment: 95 Encounter Discharge Information Items Post Procedure Vitals Discharge Condition: Stable Temperature (F): 98.6 Ambulatory Status: Ambulatory Pulse (bpm):  92 Discharge Destination: Home Respiratory Rate (breaths/min): 18 Transportation: Private Auto Blood Pressure (mmHg): 144/80 Accompanied By: self Schedule Follow-up Appointment: Yes Clinical Summary of Care: Patient Declined Electronic Signature(s) Signed: 08/20/2021 3:21:06 PM By: Adline Peals Entered By: Adline Peals on 08/20/2021 15:07:07 -------------------------------------------------------------------------------- Lower Extremity Assessment Details Patient Name: Date of Service: [name] 08/20/2021 2:15 PM Medical Record Number: [id number] Patient Account Number: [id number] Date of Birth/Sex: Treating RN: [birth year] (36 y.o. [name] Primary Care Shalen Petrak: [name] Other Clinician: Referring Summit Arroyave: Treating [name]/Extender: [name] in Treatment: 95 Edema Assessment Assessed: [name]: No] [Right: No] Edema: [Left: Yes] [Right: Yes] Calf Left: Right: Point of Measurement: 48 cm From Medial Instep 49.4 cm 49.2 cm Ankle Left: Right: Point of Measurement: 11 cm From Medial Instep 27.3 cm 30 cm Vascular Assessment Pulses: Dorsalis Pedis Palpable: [Left:Yes] [Right:Yes] Electronic Signature(s) Signed: 08/20/2021 3:21:06 PM By: Adline Peals Entered By: Adline Peals on 08/20/2021 14:35:38 -------------------------------------------------------------------------------- Multi Wound Chart Details Patient Name: Date of Service: [name], [name] 08/20/2021 2:15 PM Medical Record Number: [id number] Patient Account Number: [id number] Date of Birth/Sex: Treating RN: 07-Jan-1986 (36 y.o. [name] Primary Care [name]: [name] Other Clinician: Referring [name]: Treating Tmya Wigington/Extender: [name] in Treatment: 95 Vital Signs Height(in): 77 Capillary Blood Glucose(mg/dl): 120 Weight(lbs): 280 Pulse(bpm): 92 Body Mass Index(BMI): 33.2 Blood  Pressure(mmHg): 144/80 Temperature(F): 98.6 Respiratory Rate(breaths/min): 18 Photos: [11:No Photos Right, Anterior Foot] [3:No Photos Left, Lateral, Plantar Foot] [8:No Photos Right, Lateral Foot] Wound Location: [11:Gradually Appeared] [3:Trauma] [8:Blister] Wounding Event: [11:Diabetic Wound/Ulcer of the Lower] [3:Diabetic Wound/Ulcer of the Lower] [8:Diabetic Wound/Ulcer of the Lower] Primary Etiology: [11:Extremity Type II Diabetes] [3:Extremity Type II Diabetes] [8:Extremity Type II Diabetes] Comorbid History: [11:08/20/2021] [3:10/02/2019] [8:04/18/2021] Date Acquired: [11:0] [3:95] [8:17] Weeks of Treatment: [11:Open] [3:Open] [8:Open] Wound Status: [11:No] [3:No] [8:No] Wound Recurrence: [11:0.2x0.2x0.1] [3:3.5x5.2x0.3] [8:4x3.6x0.5] Measurements L x W x [name] [11:0.031] [3:14.294] [8:11.31] A (cm) : rea [11:0.003] [3:4.288] [8:5.655] Volume (cm) : [  11:N/A] [3:-766.80%] [8:-1956.40%] % Reduction in A rea: [11:N/A] [3:-2498.80%] [8:-5040.90%] % Reduction in Volume: [3:5] Position 1 (o'clock): [3:1.1] Maximum Distance 1 (cm): [8:3] Starting Position 1 (o'clock): [8:4] Ending Position 1 (o'clock): [8:0.2] Maximum Distance 1 (cm): [11:No] [3:Yes] [8:N/A] Tunneling: [11:No] [3:N/A] [8:Yes] Undermining: [11:Grade 2] [3:Grade 2] [8:Grade 2] Classification: [11:Medium] [3:Medium] [8:Large] Exudate A mount: [11:Purulent] [3:Serosanguineous] [8:Serosanguineous] Exudate Type: [11:yellow, brown, green] [3:red, brown] [8:red, brown] Exudate Color: [11:Distinct, outline attached] [3:Well defined, not attached] [8:Well defined, not attached] Wound Margin: [11:Large (67-100%)] [3:Large (67-100%)] [8:Large (67-100%)] Granulation A mount: [11:Red] [3:Red, Pink] [8:Red, Pink] Granulation Quality: [11:None Present (0%)] [3:Small (1-33%)] [8:Small (1-33%)] Necrotic A mount: [11:Fat Layer (Subcutaneous Tissue): Yes Fat Layer (Subcutaneous Tissue): Yes Fat Layer (Subcutaneous Tissue):  Yes] Exposed Structures: [11:Fascia: No Tendon: No Muscle: No Joint: No Bone: No Small (1-33%)] [3:Fascia: No Tendon: No Muscle: No Joint: No Bone: No Small (1-33%)] [8:Muscle: Yes Fascia: No Tendon: No Joint: No Bone: No Small (1-33%)] Epithelialization: [11:Debridement - Selective/Open Wound Debridement - Excisional] [8:Debridement - Excisional] Debridement: Pre-procedure Verification/Time Out 02:51 [3:02:51] [8:02:51] Taken: [11:Necrotic/Eschar] [3:Subcutaneous, Slough] [8:Subcutaneous, Slough] Tissue Debrided: [11:Skin/Epidermis] [3:Skin/Subcutaneous Tissue] [8:Skin/Subcutaneous Tissue] Level: [11:0.04] [3:18.2] [8:14.4] Debridement A (sq cm): [11:rea Curette] [3:Curette] [8:Curette] Instrument: [11:Minimum] [3:Minimum] [8:Minimum] Bleeding: [11:Pressure] [3:Pressure] [8:Pressure] Hemostasis A chieved: [11:0] [3:0] [8:0] Procedural Pain: [11:0] [3:0] [8:0] Post Procedural Pain: [11:Procedure was tolerated well] [3:Procedure was tolerated well] [8:Procedure was tolerated well] Debridement Treatment Response: [11:0.2x0.2x0.1] [3:3.5x5.2x0.3] [8:4x3.6x0.5] Post Debridement Measurements L x W x [name] [11:0.003] [3:4.288] [8:5.655] Post Debridement Volume: (cm) [11:Debridement] [3:Debridement] [8:Debridement] Treatment Notes Electronic Signature(s) Signed: 08/20/2021 2:59:02 PM By: [name] MD FACS Signed: 08/23/2021 5:26:25 PM By: [name] RN, BSN Entered By: [name] on 08/20/2021 14:59:02 -------------------------------------------------------------------------------- Multi-Disciplinary Care Plan Details Patient Name: Date of Service: [name], [name] 08/20/2021 2:15 PM Medical Record Number: [id number] Patient Account Number: [id number] Date of Birth/Sex: Treating RN: 05-15-1985 (37 y.o. [name] Primary Care Chestine Belknap: [name] Other Clinician: Referring Kellianne Ek: Treating Bryer Gottsch/Extender: [name] in  Treatment: [place] reviewed with physician Active Inactive Nutrition Nursing Diagnoses: Imbalanced nutrition Potential for alteratiion in Nutrition/Potential for imbalanced nutrition Goals: Patient/caregiver agrees to and verbalizes understanding of need to use nutritional supplements and/or vitamins as prescribed Date Initiated: 10/24/2019 Date Inactivated: 04/06/2020 Target Resolution Date: 04/03/2020 Goal Status: Met Patient/caregiver will maintain therapeutic glucose control Date Initiated: 10/24/2019 Target Resolution Date: 09/01/2021 Goal Status: Active Interventions: Assess HgA1c results as ordered upon admission and as needed Assess patient nutrition upon admission and as needed per policy Provide education on elevated blood sugars and impact on wound healing Provide education on nutrition Treatment Activities: Education provided on Nutrition : 06/16/2021 Notes: 11/17/20: Glucose control ongoing issue, target date extended. 01/26/21: Glucose management continues. Wound/Skin Impairment Nursing Diagnoses: Impaired tissue integrity Knowledge deficit related to ulceration/compromised skin integrity Goals: Patient/caregiver will verbalize understanding of skin care regimen Date Initiated: 10/24/2019 Target Resolution Date: 09/01/2021 Goal Status: Active Ulcer/skin breakdown will have a volume reduction of 30% by week 4 Date Initiated: 10/24/2019 Date Inactivated: 01/16/2020 Target Resolution Date: 01/10/2020 Unmet Reason: no change in Goal Status: Unmet measurements. Interventions: Assess patient/caregiver ability to obtain necessary supplies Assess patient/caregiver ability to perform ulcer/skin care regimen upon admission and as needed Assess ulceration(s) every visit Provide education on ulcer and skin care Notes: 11/17/20: Wound care regimen continues Electronic Signature(s) Signed: 08/20/2021 3:21:06 PM By: Adline Peals Entered By: Adline Peals on 08/20/2021 14:26:16 -------------------------------------------------------------------------------- Pain  Assessment Details Patient Name: Date of Service: [name] 08/20/2021 2:15 PM Medical Record Number: [id number] Patient Account Number: [id number] Date of Birth/Sex: Treating RN: 16-Jun-1985 (36 y.o. [name] Primary Care Lucill Mauck: [name] Other Clinician: Referring Lamekia Nolden: Treating Lorraine Cimmino/Extender: [name] in Treatment: 95 Active Problems Location of Pain Severity and Description of Pain Patient Has Paino No Site Locations Rate the pain. Rate the pain. Current Pain Level: 0 Pain Management and Medication Current Pain Management: Electronic Signature(s) Signed: 08/20/2021 3:21:06 PM By: Adline Peals Entered By: Adline Peals on 08/20/2021 14:24:29 -------------------------------------------------------------------------------- Patient/Caregiver Education Details Patient Name: Date of Service: [name] 5/19/2023andnbsp2:15 PM Medical Record Number: [id number] Patient Account Number: [id number] Date of Birth/Gender: Treating RN: 05/05/1985 (36 y.o. [name] Primary Care Physician: [name] Other Clinician: Referring Physician: Treating Physician/Extender: [name] in Treatment: 95 Education Assessment Education Provided To: Patient Education Topics Provided Wound/Skin Impairment: Methods: Explain/Verbal Responses: Reinforcements needed, State content correctly Electronic Signature(s) Signed: 08/20/2021 3:21:06 PM By: Adline Peals Entered By: Adline Peals on 08/20/2021 14:26:30 -------------------------------------------------------------------------------- Wound Assessment Details Patient Name: Date of Service: [name] 08/20/2021 2:15 PM Medical Record Number: [id number] Patient Account Number:  [id number] Date of Birth/Sex: Treating RN: 07/14/1985 (36 y.o. [name] Primary Care Jalexa Pifer: [name] Other Clinician: Referring Korynn Kenedy: Treating Zayra Devito/Extender: [name] in Treatment: 95 Wound Status Wound Number: 11 Primary Etiology: Diabetic Wound/Ulcer of the Lower Extremity Wound Location: Right, Anterior Foot Wound Status: Open Wounding Event: Gradually Appeared Comorbid History: Type II Diabetes Date Acquired: 08/20/2021 Weeks Of Treatment: 0 Clustered Wound: No Wound Measurements Length: (cm) 0.2 Width: (cm) 0.2 Depth: (cm) 0.1 Area: (cm) 0.031 Volume: (cm) 0.003 % Reduction in Area: % Reduction in Volume: Epithelialization: Small (1-33%) Tunneling: No Undermining: No Wound Description Classification: Grade 2 Wound Margin: Distinct, outline attached Exudate Amount: Medium Exudate Type: Purulent Exudate Color: yellow, brown, green Foul Odor After Cleansing: No Slough/Fibrino No Wound Bed Granulation Amount: Large (67-100%) Exposed Structure Granulation Quality: Red Fascia Exposed: No Necrotic Amount: None Present (0%) Fat Layer (Subcutaneous Tissue) Exposed: Yes Tendon Exposed: No Muscle Exposed: No Joint Exposed: No Bone Exposed: No Treatment Notes Wound #11 (Foot) Wound Laterality: Right, Anterior Cleanser Soap and Water Discharge Instruction: May shower and wash wound with dial antibacterial soap and water prior to dressing change. Byram Ancillary Kit - 15 Day Supply Discharge Instruction: Use supplies as instructed; Kit contains: (15) Saline Bullets; (15) 3x3 Gauze; 15 pr Gloves Peri-Wound Care Topical keystone antibiotic compound Primary Dressing PolyMem Silver Non-Adhesive Dressing, 4.25x4.25 in Discharge Instruction: Apply to wound bed as instructed Secondary Dressing ABD Pad, 8x10 Discharge Instruction: Apply over primary dressing as directed. Optifoam Non-Adhesive Dressing, 4x4  in Discharge Instruction: Apply over primary dressing as directed. Zetuvit Plus 4x8 in Discharge Instruction: Apply over primary dressing as directed. NonWoven Sponge, 4x4 (in/in) Secured With Coban Self-Adherent Wrap 4x5 (in/yd) Discharge Instruction: Secure with Coban as directed. Kerlix Roll Sterile, 4.5x3.1 (in/yd) Discharge Instruction: Secure with Kerlix as directed. 63M Medipore H Soft Cloth Surgical T ape, 4 x 10 (in/yd) Discharge Instruction: Secure with tape as directed. Compression Wrap Compression Stockings Add-Ons Electronic Signature(s) Signed: 08/20/2021 3:21:06 PM By: Adline Peals Entered By: Adline Peals on 08/20/2021 14:51:13 -------------------------------------------------------------------------------- Wound Assessment Details Patient Name: Date of Service: [name] 08/20/2021 2:15 PM Medical Record Number: [id number] Patient Account Number: [id number] Date of Birth/Sex: Treating RN: [birth year] (  36 y.o. [name] Primary Care Etoy Mcdonnell: [name] Other Clinician: Referring Ladainian Therien: Treating Chistine Dematteo/Extender: [name] in Treatment: 95 Wound Status Wound Number: 3 Primary Etiology: Diabetic Wound/Ulcer of the Lower Extremity Wound Location: Left, Lateral, Plantar Foot Wound Status: Open Wounding Event: Trauma Comorbid History: Type II Diabetes Date Acquired: 10/02/2019 Weeks Of Treatment: 95 Clustered Wound: No Wound Measurements Length: (cm) 3.5 Width: (cm) 5.2 Depth: (cm) 0.3 Area: (cm) 14.294 Volume: (cm) 4.288 % Reduction in Area: -766.8% % Reduction in Volume: -2498.8% Epithelialization: Small (1-33%) Tunneling: Yes Position (o'clock): 5 Maximum Distance: (cm) 1.1 Wound Description Classification: Grade 2 Wound Margin: Well defined, not attached Exudate Amount: Medium Exudate Type: Serosanguineous Exudate Color: red, brown Foul Odor After Cleansing: No Slough/Fibrino  Yes Wound Bed Granulation Amount: Large (67-100%) Exposed Structure Granulation Quality: Red, Pink Fascia Exposed: No Necrotic Amount: Small (1-33%) Fat Layer (Subcutaneous Tissue) Exposed: Yes Necrotic Quality: Adherent Slough Tendon Exposed: No Muscle Exposed: No Joint Exposed: No Bone Exposed: No Treatment Notes Wound #3 (Foot) Wound Laterality: Plantar, Left, Lateral Cleanser Soap and Water Discharge Instruction: May shower and wash wound with dial antibacterial soap and water prior to dressing change. Byram Ancillary Kit - 15 Day Supply Discharge Instruction: Use supplies as instructed; Kit contains: (15) Saline Bullets; (15) 3x3 Gauze; 15 pr Gloves Peri-Wound Care Topical keystone antibiotic compound Primary Dressing PolyMem Silver Non-Adhesive Dressing, 4.25x4.25 in Discharge Instruction: Apply to wound bed as instructed Secondary Dressing ABD Pad, 8x10 Discharge Instruction: Apply over primary dressing as directed. Optifoam Non-Adhesive Dressing, 4x4 in Discharge Instruction: Apply over primary dressing as directed. Zetuvit Plus 4x8 in Discharge Instruction: Apply over primary dressing as directed. NonWoven Sponge, 4x4 (in/in) Secured With Coban Self-Adherent Wrap 4x5 (in/yd) Discharge Instruction: Secure with Coban as directed. Kerlix Roll Sterile, 4.5x3.1 (in/yd) Discharge Instruction: Secure with Kerlix as directed. 21M Medipore H Soft Cloth Surgical T ape, 4 x 10 (in/yd) Discharge Instruction: Secure with tape as directed. Compression Wrap Compression Stockings Add-Ons Electronic Signature(s) Signed: 08/20/2021 3:21:06 PM By: Adline Peals Entered By: Adline Peals on 08/20/2021 14:39:31 -------------------------------------------------------------------------------- Wound Assessment Details Patient Name: Date of Service: [name] 08/20/2021 2:15 PM Medical Record Number: [id number] Patient Account Number: [id number] Date of  Birth/Sex: Treating RN: 03/23/1986 (36 y.o. [name] Primary Care Anarie Kalish: [name] Other Clinician: Referring Jashun Puertas: Treating Khyrie Masi/Extender: [name] in Treatment: 95 Wound Status Wound Number: 8 Primary Etiology: Diabetic Wound/Ulcer of the Lower Extremity Wound Location: Right, Lateral Foot Wound Status: Open Wounding Event: Blister Comorbid History: Type II Diabetes Date Acquired: 04/18/2021 Weeks Of Treatment: 17 Clustered Wound: No Wound Measurements Length: (cm) 4 Width: (cm) 3.6 Depth: (cm) 0.5 Area: (cm) 11.31 Volume: (cm) 5.655 % Reduction in Area: -1956.4% % Reduction in Volume: -5040.9% Epithelialization: Small (1-33%) Undermining: Yes Starting Position (o'clock): 3 Ending Position (o'clock): 4 Maximum Distance: (cm) 0.2 Wound Description Classification: Grade 2 Wound Margin: Well defined, not attached Exudate Amount: Large Exudate Type: Serosanguineous Exudate Color: red, brown Foul Odor After Cleansing: No Slough/Fibrino Yes Wound Bed Granulation Amount: Large (67-100%) Exposed Structure Granulation Quality: Red, Pink Fascia Exposed: No Necrotic Amount: Small (1-33%) Fat Layer (Subcutaneous Tissue) Exposed: Yes Necrotic Quality: Adherent Slough Tendon Exposed: No Muscle Exposed: Yes Necrosis of Muscle: No Joint Exposed: No Bone Exposed: No Treatment Notes Wound #8 (Foot) Wound Laterality: Right, Lateral Cleanser Soap and Water Discharge Instruction: May shower and wash wound with dial antibacterial soap and water prior to dressing change.  Byram Ancillary Kit - 15 Day Supply Discharge Instruction: Use supplies as instructed; Kit contains: (15) Saline Bullets; (15) 3x3 Gauze; 15 pr Gloves Peri-Wound Care Topical keystone antibiotic compound Primary Dressing PolyMem Silver Non-Adhesive Dressing, 4.25x4.25 in Discharge Instruction: Apply to wound bed as instructed Secondary Dressing ABD  Pad, 8x10 Discharge Instruction: Apply over primary dressing as directed. Optifoam Non-Adhesive Dressing, 4x4 in Discharge Instruction: Apply over primary dressing as directed. Zetuvit Plus 4x8 in Discharge Instruction: Apply over primary dressing as directed. NonWoven Sponge, 4x4 (in/in) Secured With Coban Self-Adherent Wrap 4x5 (in/yd) Discharge Instruction: Secure with Coban as directed. Kerlix Roll Sterile, 4.5x3.1 (in/yd) Discharge Instruction: Secure with Kerlix as directed. 32M Medipore H Soft Cloth Surgical T ape, 4 x 10 (in/yd) Discharge Instruction: Secure with tape as directed. Compression Wrap Compression Stockings Add-Ons Electronic Signature(s) Signed: 08/20/2021 3:21:06 PM By: Adline Peals Entered By: Adline Peals on 08/20/2021 14:40:40 -------------------------------------------------------------------------------- Vitals Details Patient Name: Date of Service: [name], [name] 08/20/2021 2:15 PM Medical Record Number: [id number] Patient Account Number: [id number] Date of Birth/Sex: Treating RN: 04/07/1985 (36 y.o. [name] Primary Care Jacai Kipp: [name] Other Clinician: Referring Aragon Scarantino: Treating Torie Towle/Extender: [name] in Treatment: 95 Vital Signs Time Taken: 14:27 Temperature (F): 98.6 Height (in): 77 Pulse (bpm): 92 Weight (lbs): 280 Respiratory Rate (breaths/min): 18 Body Mass Index (BMI): 33.2 Blood Pressure (mmHg): 144/80 Capillary Blood Glucose (mg/dl): 120 Reference Range: 80 - 120 mg / dl Electronic Signature(s) Signed: 08/20/2021 3:21:06 PM By: Adline Peals Entered By: Adline Peals on 08/20/2021 14:29:06

## 2021-08-23 NOTE — Progress Notes (Signed)
[name] ([id number]) Visit Report for 08/20/2021 Chief Complaint Document Details Patient Name: Date of Service: [name] 08/20/2021 2:15 PM Medical Record Number: [id number] Patient Account Number: [id number] Date of Birth/Sex: Treating RN: Feb 01, 1986 (36 y.o. [name] Primary Care Provider: [name] Other Clinician: Referring Provider: Treating Provider/Extender: [name] in Treatment: 95 Information Obtained from: Patient Chief Complaint 01/11/2019; patient is here for review of a rather substantial wound over the left fifth plantar metatarsal head extending into the lateral part of his foot 10/24/2019; patient returns to clinic with wounds on his bilateral feet with underlying osteomyelitis biopsy-proven Electronic Signature(s) Signed: 08/20/2021 2:59:13 PM By: [name] MD FACS Entered By: [name] on 08/20/2021 14:59:13 -------------------------------------------------------------------------------- Debridement Details Patient Name: Date of Service: [name], [name] 08/20/2021 2:15 PM Medical Record Number: [id number] Patient Account Number: [id number] Date of Birth/Sex: Treating RN: 1986-03-14 (36 y.o. [name] Primary Care Provider: [name] Other Clinician: Referring Provider: Treating Provider/Extender: [name] in Treatment: 95 Debridement Performed for Assessment: Wound #8 Right,Lateral Foot Performed By: Physician [name], MD Debridement Type: Debridement Severity of Tissue Pre Debridement: Fat layer exposed Level of Consciousness (Pre-procedure): Awake and Alert Pre-procedure Verification/Time Out Yes - 02:51 Taken: Start Time: 02:51 T Area Debrided (L x W): otal 4 (cm) x 3.6 (cm) = 14.4 (cm) Tissue and other material debrided: Viable, Non-Viable, Slough, Subcutaneous, Slough Level: Skin/Subcutaneous Tissue Debridement Description:  Excisional Instrument: Curette Bleeding: Minimum Hemostasis Achieved: Pressure Procedural Pain: 0 Post Procedural Pain: 0 Response to Treatment: Procedure was tolerated well Level of Consciousness (Post- Awake and Alert procedure): Post Debridement Measurements of Total Wound Length: (cm) 4 Width: (cm) 3.6 Depth: (cm) 0.5 Volume: (cm) 5.655 Character of Wound/Ulcer Post Debridement: Improved Severity of Tissue Post Debridement: Fat layer exposed Post Procedure Diagnosis Same as Pre-procedure Electronic Signature(s) Signed: 08/20/2021 3:21:06 PM By: Adline Peals Signed: 08/23/2021 7:39:29 AM By: [name] MD FACS Entered By: Adline Peals on 08/20/2021 14:52:45 -------------------------------------------------------------------------------- Debridement Details Patient Name: Date of Service: [name], [name] 08/20/2021 2:15 PM Medical Record Number: [id number] Patient Account Number: [id number] Date of Birth/Sex: Treating RN: 1985/12/26 (36 y.o. [name] Primary Care Provider: [name] Other Clinician: Referring Provider: Treating Provider/Extender: [name] in Treatment: 95 Debridement Performed for Assessment: Wound #11 [place] Performed By: Physician [name], MD Debridement Type: Debridement Severity of Tissue Pre Debridement: Fat layer exposed Level of Consciousness (Pre-procedure): Awake and Alert Pre-procedure Verification/Time Out Yes - 02:51 Taken: Start Time: 02:51 T Area Debrided (L x W): otal 0.2 (cm) x 0.2 (cm) = 0.04 (cm) Tissue and other material debrided: Viable, Non-Viable, Eschar, Skin: Epidermis Level: Skin/Epidermis Debridement Description: Selective/Open Wound Instrument: Curette Bleeding: Minimum Hemostasis Achieved: Pressure Procedural Pain: 0 Post Procedural Pain: 0 Response to Treatment: Procedure was tolerated well Level of Consciousness (Post- Awake  and Alert procedure): Post Debridement Measurements of Total Wound Length: (cm) 0.2 Width: (cm) 0.2 Depth: (cm) 0.1 Volume: (cm) 0.003 Character of Wound/Ulcer Post Debridement: Improved Severity of Tissue Post Debridement: Fat layer exposed Post Procedure Diagnosis Same as Pre-procedure Electronic Signature(s) Signed: 08/20/2021 3:21:06 PM By: Adline Peals Signed: 08/23/2021 7:39:29 AM By: [name] MD FACS Entered By: Adline Peals on 08/20/2021 14:53:23 -------------------------------------------------------------------------------- Debridement Details Patient Name: Date of Service: [name], [name] 08/20/2021 2:15 PM Medical Record Number: [id number] Patient Account Number: [id number] Date of Birth/Sex: Treating RN: July 16, 1985 (36 y.o. M)  Adline Peals Primary Care Provider: Other Clinician: [name] Referring Provider: Treating Provider/Extender: [name] in Treatment: 95 Debridement Performed for Assessment: Wound #3 Left,Lateral,Plantar Foot Performed By: Physician [name], MD Debridement Type: Debridement Severity of Tissue Pre Debridement: Fat layer exposed Level of Consciousness (Pre-procedure): Awake and Alert Pre-procedure Verification/Time Out Yes - 02:51 Taken: Start Time: 02:51 T Area Debrided (L x W): otal 3.5 (cm) x 5.2 (cm) = 18.2 (cm) Tissue and other material debrided: Viable, Non-Viable, Slough, Subcutaneous, Slough Level: Skin/Subcutaneous Tissue Debridement Description: Excisional Instrument: Curette Bleeding: Minimum Hemostasis Achieved: Pressure Procedural Pain: 0 Post Procedural Pain: 0 Response to Treatment: Procedure was tolerated well Level of Consciousness (Post- Awake and Alert procedure): Post Debridement Measurements of Total Wound Length: (cm) 3.5 Width: (cm) 5.2 Depth: (cm) 0.3 Volume: (cm) 4.288 Character of Wound/Ulcer Post Debridement: Improved Severity of  Tissue Post Debridement: Fat layer exposed Post Procedure Diagnosis Same as Pre-procedure Electronic Signature(s) Signed: 08/20/2021 3:21:06 PM By: Adline Peals Signed: 08/23/2021 7:39:29 AM By: [name] MD FACS Entered By: Adline Peals on 08/20/2021 14:54:07 -------------------------------------------------------------------------------- HPI Details Patient Name: Date of Service: [name], [name] 08/20/2021 2:15 PM Medical Record Number: [id number] Patient Account Number: [id number] Date of Birth/Sex: Treating RN: 1985/10/20 (36 y.o. 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 He comes in for review of a wound over the plantar fifth metatarsal head extending into the lateral part of the foot. He was followed for this previously by his podiatrist Dr. Cornelius Moras. As the patient tells his story he went to see podiatry first for a swelling he developed on the lateral part of his fifth metatarsal head in May. He states this was "open" by podiatry and the area closed. He was followed up in June and it was again opened callus removed and it closed promptly. There were plans being made for surgery on the fifth metatarsal head in June however his blood sugar was apparently too high for anesthesia. Apparently the area was debrided and opened again in June and it is never closed since. Looking over the records from podiatry I am really not able to follow this. It was clear when he was first seen it was before 5/14 at that point he already had a wound. By 5/17 the ulcer was resolved. I do not see anything about a procedure. On 5/28 noted to have pre-ulcerative moderate keratosis. X-ray noted 1/5 contracted toe  and tailor's bunion and metatarsal deformity. On a visit date on 09/28/2018 the dorsal part of the left foot it healed and resolved. There was concern about swelling in his lower extremity he was sent to the ER.. As far as I can tell he was seen in the ER on 7/12 with an ulcer on his left foot. A DVT rule out of the left leg was negative. I do not think I have complete records from podiatry but I am not able to verify the procedures this patient states he had. He states after the last procedure the wound has never closed although I am not able to follow this in the records I have from podiatry. He has not had a recent x-ray The patient has been using Neosporin on the wound. He is wearing a Darco shoe. He is still very  active up on his foot working and exercising. Past medical history; type 2 diabetes ketosis-prone, leg swelling with a negative DVT study in July. Non-smoker ABI in our clinic was 0.85 on the left 10/16; substantial wound on the plantar left fifth met head extending laterally almost to the dorsal fifth MTP. We have been using silver alginate we gave him a Darco forefoot off loader. An x-ray did not show evidence of osteomyelitis did note soft tissue emphysema which I think was due to gas tracking through an open wound. There is no doubt in my mind he requires an MRI 10/23; MRI not booked until 3 November at the earliest this is largely due to his glucose sensor in the right arm. We have been using silver alginate. There has been an improvement 10/29; I am still not exactly sure when his MRI is booked for. He says it is the third but it is the 10th in epic. This definitely needs to be done. He is running a low-grade fever today but no other symptoms. No real improvement in the 1 02/26/2019 patient presents today for a follow-up visit here in our clinic he is last been seen in the clinic on October 29. Subsequently we were working on getting MRI to evaluate and see what exactly was going  on and where we would need to go from the standpoint of whether or not he had osteomyelitis and again what treatments were going be required. Subsequently the patient ended up being admitted to the hospital on 02/07/2019 and was discharged on 02/14/2019. This is a somewhat interesting admission with a discharge diagnosis of pneumonia due to COVID-19 although he was positive for COVID-19 when tested at the urgent care but negative x2 when he was actually in the hospital. With that being said he did have acute respiratory failure with hypoxia and it was noted he also have a left foot ulceration with osteomyelitis. With that being said he did require oxygen for his pneumonia and I level 4 L. He was placed on antivirals and steroids for the COVID-19. He was also transferred to the [facility] at one point. Nonetheless he did subsequently discharged home and since being home has done much better in that regard. The CT angiogram did not show any pulmonary embolism. With regard to the osteomyelitis the patient was placed on vancomycin and Zosyn while in the hospital but has been changed to Augmentin at discharge. It was also recommended that he follow- up with wound care and podiatry. Podiatry however wanted him to see [name] according to the patient prior to them doing anything further. His hemoglobin A1c was 9.9 as noted in the hospital. Have an MRI of the left foot performed while in the hospital on 02/04/2019. 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 There is an overlying plantar open wound noted an abscess tracking back along the lateral aspect of the fifth metatarsal shaft. There is otherwise diffuse cellulitis and mild fasciitis without findings of polymyositis. The patient did have recently pneumonia secondary to COVID-19 I looked in the chart through epic and it does appear that the patient may need to  have an additional x-ray just to ensure everything is cleared and that he has no airspace disease prior to putting him into the [name]. 03/05/2019; patient was readmitted to the clinic last week. He was hospitalized twice for a viral upper respiratory tract infection from 11/1 through 11/4  and then 11/5 through 11/12 ultimately this turned out to be Covid pneumonitis. Although he was discharged on oxygen he is not using it. He says he feels fine. He has no exercise limitation no cough no sputum. His O2 sat in our clinic today was 100% on room air. 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 He received Vanco and Zosyn in the hospital and then was discharged on 2 weeks of Augmentin. I do not see any relevant cultures. He was supposed to follow-up with infectious disease but I do not see that he has an appointment. 12/8; patient saw Dr. Novella Olive of infectious disease last week. He felt that he had had adequate antibiotic therapy. He did not go to follow-up with Dr. Amalia Hailey of podiatry and I have again talked to him about the pros and cons of this. He does not want to consider a ray amputation of this time. He is aware of the risks of recurrence, migration etc. He started HBO today and tolerated this well. He can complete the Augmentin that I gave him last week. I have looked over the lab work that Dr. Chana Bode ordered his C-reactive protein was 3.3 and his sedimentation rate was 17. The C-reactive protein is never really been measurably that high in this patient 12/15; not much change in the wound today however he has undermining along the lateral part of the foot again more extensively than last week. He has some rims of epithelialization. We have been using silver alginate. He is undergoing hyperbarics but did not dive today 12/18; in for his obligatory first total contact cast change.  Unfortunately there was pus coming from the undermining area around his fifth metatarsal head. This was cultured but will preclude reapplication of a cast. He is seen in conjunction with HBO 12/24; patient had staph lugdunensis in the wound in the undermining area laterally last time. We put him on doxycycline which should have covered this. The wound looks better today. I am going to give him another week of doxycycline before reattempting the total contact cast 12/31; the patient is completing antibiotics. Hemorrhagic debris in the distal part of the wound with some undermining distally. He also had hyper granulation. Extensive debridement with a #5 curette. The infected area that was on the lateral part of the fifth met head is closed over. I do not think he needs any more antibiotics. Patient was seen prior to HBO. Preparations for a total contact cast were made in the cast will be placed post hyperbarics 04/11/19; once again the patient arrives today without complaint. He had been in a cast all week noted that he had heavy drainage this week. This resulted in large raised areas of macerated tissue around the wound 1/14; wound bed looks better slightly smaller. Hydrofera Blue has been changing himself. He had a heavy drainage last week which caused a lot of maceration around the wound so I took him out of a total contact cast he says the drainage is actually better this week He is seen today in conjunction with HBO 1/21; returns to clinic. He was up in [place] for a day or 2 attending a funeral. He comes back in with the wound larger and with a large area of exposed bone. He had osteomyelitis and septic arthritis of the fifth left metatarsal head while he was in hospital. He received IV antibiotics in the hospital  for a prolonged period of time then 3 weeks of Augmentin. Subsequently I gave him 2 weeks of doxycycline for more superficial wound infection. When I saw this last week the wound was  smaller the surface of the wound looks satisfactory. 1/28; patient missed hyperbarics today. Bone biopsy I did last time showed Enterococcus faecalis and Staphylococcus lugdunensis . He has a wide area of exposed bone. We are going to use silver alginate as of today. I had another ethical discussion with the patient. 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 In this situation I think the likelihood of healing this is low. Therefore I have recommended a ray amputation and with the patient's agreement I have referred him to Dr. Doran Durand. The other issue is that his compliance with hyperbarics has been minimal because of his work schedule and given his underlying decision I am going to stop this today READMISSION 10/24/2019 MRI 09/29/2019 left foot IMPRESSION: 1. Apparent skin ulceration inferior and lateral to the 5th metatarsal base with underlying heterogeneous T2 signal and enhancement in the subcutaneous fat. Small peripherally enhancing fluid collections along the plantar and lateral aspects of the 5th metatarsal base suspicious for abscesses. 2. Interval amputation through the mid 5th metatarsal with nonspecific low-level marrow edema and enhancement. Given the proximity to the adjacent soft tissue inflammatory changes, osteomyelitis cannot be excluded. 3. The additional bones appear unremarkable. MRI 09/29/2019 right foot IMPRESSION: 1. Soft tissue ulceration lateral to the 5th MTP joint. There is low-level T2 hyperintensity within the 4th and 5th metatarsal heads and adjacent proximal phalanges without abnormal T1 signal or cortical destruction. These findings are nonspecific and could be seen with early marrow edema, hyperemia or early osteomyelitis. No evidence of septic joint. 2. Mild tenosynovitis and synovial enhancement associated with the extensor digitorum tendons at the level of the midfoot. 3. Diffuse low-level muscular T2 hyperintensity  and enhancement, most consistent with diabetic myopathy. LEFT FOOT BONE Methicillin resistant staphylococcus aureus Staphylococcus lugdunensis MIC MIC CIPROFLOXACIN >=8 RESISTANT Resistant <=0.5 SENSI... Sensitive CLINDAMYCIN <=0.25 SENS... Sensitive >=8 RESISTANT Resistant ERYTHROMYCIN >=8 RESISTANT Resistant >=8 RESISTANT Resistant GENTAMICIN <=0.5 SENSI... Sensitive <=0.5 SENSI... Sensitive Inducible Clindamycin NEGATIVE Sensitive NEGATIVE Sensitive OXACILLIN >=4 RESISTANT Resistant 2 SENSITIVE Sensitive RIFAMPIN <=0.5 SENSI... Sensitive <=0.5 SENSI... Sensitive TETRACYCLINE <=1 SENSITIVE Sensitive <=1 SENSITIVE Sensitive TRIMETH/SULFA <=10 SENSIT Sensitive <=10 SENSIT Sensitive ... Marland Kitchen.. VANCOMYCIN 1 SENSITIVE Sensitive <=0.5 SENSI... Sensitive Right foot bone . Component 3 wk ago Specimen Description BONE Special Requests RIGHT 4 METATARSAL SAMPLE B Gram Stain NO WBC SEEN NO ORGANISMS SEEN Culture RARE METHICILLIN RESISTANT STAPHYLOCOCCUS AUREUS NO ANAEROBES ISOLATED Performed at [facility], [place] [address] [place], [place] [address] Report Status 10/08/2019 FINAL Organism ID, Bacteria METHICILLIN RESISTANT STAPHYLOCOCCUS AUREUS Resulting Agency [facility] Susceptibility Methicillin resistant staphylococcus aureus MIC CIPROFLOXACIN >=8 RESISTANT Resistant CLINDAMYCIN <=0.25 SENS... Sensitive ERYTHROMYCIN >=8 RESISTANT Resistant GENTAMICIN <=0.5 SENSI... Sensitive Inducible Clindamycin NEGATIVE Sensitive OXACILLIN >=4 RESISTANT Resistant RIFAMPIN <=0.5 SENSI... Sensitive TETRACYCLINE <=1 SENSITIVE Sensitive TRIMETH/SULFA <=10 SENSIT Sensitive ... VANCOMYCIN 1 SENSITIVE Sensitive This is a patient we had in clinic earlier this year with a wound over his left fifth metatarsal head. 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 Doran Durand on 05/16/2019.  He seems to have developed problems with wounds on his bilateral feet in June 2021 although he may have had problems earlier than this. He was in an urgent care with a right foot ulcer on 09/26/2019 and given a course of doxycycline. This was apparently after having trouble getting into see orthopedics. He was seen by podiatry on 09/28/2019 noted to have bilateral lower extremity ulcers including the left lateral fifth metatarsal base and the right subfifth met head. It was noted that had purulent drainage at that time. He required hospitalization from 6/20 through 7/2. This was because of worsening right foot wounds. He underwent bilateral operative incision and drainage and bone biopsies bilaterally. Culture results are listed above. He has been referred back to clinic by Dr. Jacqualyn Posey of podiatry. He is also followed by Dr. Megan Salon who saw him yesterday. He was discharged from hospital on Zyvox Flagyl and Levaquin and yesterday changed to doxycycline Flagyl and Levaquin. His inflammatory markers on 6/26 showed a sedimentation rate of 129 and a C-reactive protein of 5. This is improved to 14 and 1.3 respectively. This would indicate improvement. ABIs in our clinic today were 1.23 on the right and 1.20 on the left 11/01/2019 on evaluation today patient appears to be doing fairly well in regard to the wounds on his feet at this point. Fortunately there is no signs of active infection at this time. No fevers, chills, nausea, vomiting, or diarrhea. He currently is seeing infectious disease and still under their care at this point. Subsequently he also has both wounds which she has not been using collagen on as he did not receive that in his packaging he did not call us and let us know that. Apparently that just was missed on the order. Nonetheless  we will get that straightened out today. 8/9-Patient returns for bilateral foot wounds, using Prisma with hydrogel moistened dressings, and the wounds appear stable. Patient using surgical shoes, avoiding much pressure or weightbearing as much as possible 8/16; patient has bilateral foot wounds. 1 on the right lateral foot proximally the other is on the left mid lateral foot. Both required debridement of callus and thick skin around the wounds. We have been using silver collagen 8/27; patient has bilateral lateral foot wounds. The area on the left substantially surrounded by callus and dry skin. This was removed from the wound edge. The underlying wound is small. The area on the right measured somewhat smaller today. 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 Unfortunately I did not update his antibiotics during today's visit. 9/10 I reviewed Dr. Hale Bogus last notes he felt he had completed antibiotics his inflammatory markers were reasonably well controlled. He has a small wound on the lateral left foot and a tiny area on the right which is just above closed. He is using Hydrofera Blue with border foam he has bilateral surgical shoes 9/24; 2 week f/u. doing well. right foot is closed. left foot still undermined. 10/14; right foot remains closed at the fifth met head. The area over the base of the left fifth metatarsal has a small open area but considerable undermining towards the plantar foot. Thick callus skin around this suggests an adequate pressure relief. We have talked about this. He says he is going to go back into his cam boot. I suggested a total contact cast he did not seem enamored with this suggestion 10/26; left foot base of the fifth metatarsal. Same condition as last  time. He has skin over the area with an open wound however the skin is not adherent. He went to see Dr. Earleen Newport who did an x-ray and culture of his foot  I have not reviewed the x-ray but the patient was not told anything. He is on doxycycline 11/11; since the patient was last here he was in the emergency room on 10/30 he was concerned about swelling in the left foot. They did not do any cultures or x-rays. They changed his antibiotics to cephalexin. Previous culture showed group B strep. The cephalexin is appropriate as doxycycline has less than predictable coverage. Arrives in clinic today with swelling over this area under the wound. He also has a new wound on the right fifth metatarsal head 11/18; the patient has a difficult wound on the lateral aspect of the left fifth metatarsal head. The wound was almost ballotable last week I opened it slightly expecting to see purulence however there was just bleeding. I cultured this this was negative. X-ray unchanged. We are trying to get an MRI but I am not sure were going to be able to get this through his insurance. He also has an area on the right lateral fifth metatarsal head this looks healthier 12/3; the patient finally got our MRI. Surprisingly this did not show osteomyelitis. I did show the soft tissue ulceration at the lateral plantar aspect of the fifth metatarsal base with a tiny residual 6 mm abscess overlying the superficial fascia I have tried to culture this area I have not been able to get this to grow anything. Nevertheless the protruding tissue looks aggravated. I suspect we should try to treat the underlying "abscess with broad-spectrum antibiotics. I am going to start him on Levaquin and Flagyl. He has much less edema in his legs and I am going to continue to wrap his legs and see him weekly 12/10. I started Levaquin and Flagyl on him last week. He just picked up the Flagyl apparently there was some delay. The worry is the wound on the left fifth metatarsal base which is substantial and worsening. His foot looks like he inverts at the ankle making this a weightbearing surface. Certainly no  improvement in fact I think the measurements of this are somewhat worse. We have been using 12/17; he apparently just got the Levaquin yesterday this is 2 weeks after the fact. He has completed the Flagyl. The area over the left fifth metatarsal base still has protruding granulation tissue although it does not look quite as bad as it did some weeks ago. He has severe bilateral lymphedema although we have not been treating him for wounds on his legs this is definitely going to require compression. There was so much edema in the left I did not wish to put him in a total contact cast today. I am going to increase his compression from 3-4 layer. The area on the right lateral fifth met head actually look quite good and superficial. 12/23; patient arrived with callus on the right fifth met head and the substantial hyper granulated callused wound on the base of his fifth metatarsal. He says he is completing his Levaquin in 2 days but I do not think that adds up with what I gave him but I will have to double check this. We are using Hydrofera Blue on both areas. My plan is to put the left leg in a cast the week after New Year's 04/06/2020; patient's wounds about the same. Right lateral fifth metatarsal head  and left lateral foot over the base of the fifth metatarsal. There is undermining on the left lateral foot which I removed before application of total contact cast continuing with Hydrofera Blue new. Patient tells me he was seen by endocrinology today lab work was done [Dr. Kerr]. Also wondering whether he was referred to cardiology. I went over some lab work from previously does not have chronic renal failure certainly not nephrotic range proteinuria he does have very poorly controlled diabetes but this is not his most updated lab work. Hemoglobin A1c has been over 11 1/10; the patient had a considerable amount of leakage towards mid part of his left foot with macerated skin however the wound surface looks  better the area on the right lateral fifth met head is better as well. I am going to change the dressing on the left foot under the total contact cast to silver alginate, continue with Hydrofera Blue on the right. 1/20; patient was in the total contact cast for 10 days. Considerable amount of drainage although the skin around the wound does not look too bad on the left foot. The area on the right fifth metatarsal head is closed. Our nursing staff reports large amount of drainage out of the left lateral foot wound 1/25; continues with copious amounts of drainage described by our intake staff. PCR culture I did last week showed E. coli and Enterococcus faecalis and low quantities. Multiple resistance genes documented including extended spectrum beta lactamase, MRSA, MRSE, quinolone, tetracycline. The wound is not quite as good this week as it was 5 days ago but about the same size 2/3; continues with copious amounts of malodorous drainage per our intake nurse. The PCR culture I did 2 weeks ago showed E. coli and low quantities of Enterococcus. There were multiple resistance genes detected. I put Neosporin on him last week although this does not seem to have helped. The wound is slightly deeper today. Offloading continues to be an issue here although with the amount of drainage she has a total contact cast is just not going to work 2/10; moderate amount of drainage. Patient reports he cannot get his stocking on over the dressing. I told him we have to do that the nurse gave him suggestions on how to make this work. The wound is on the bottom and lateral part of his left foot. Is cultured predominantly grew low amounts of Enterococcus, E. coli and anaerobes. There were multiple resistance genes detected including extended spectrum beta lactamase, quinolone, tetracycline. I could not think of an easy oral combination to address this so for now I am going to do topical antibiotics provided by [facility] I  think the main agents here are vancomycin and an aminoglycoside. We have to be able to give him access to the wounds to get the topical antibiotic on 2/17; moderate amount of drainage this is unchanged. He has his Keystone topical antibiotic against the deep tissue culture organisms. He has been using this and changing the dressing daily. Silver alginate on the wound surface. 2/24; using Keystone antibiotic with silver alginate on the top. He had too much drainage for a total contact cast at one point although I think that is improving and I think in the next week or 2 it might be possible to replace a total contact cast I did not do this today. In general the wound surface looks healthy however he continues to have thick rims of skin and subcutaneous tissue around the wide area of the  circumference which I debrided 06/04/2020 upon evaluation today patient appears to be doing well in regard to his wound. I do feel like he is showing signs of improvement. There is little bit of callus and dead tissue around the edges of the wound as well as what appears to be a little bit of a sinus tract that is off to the side laterally I would perform debridement to clear that away today. 3/17; left lateral foot. The wound looks about the same as I remember. Not much depth surface looks healthy. No evidence of infection 3/25; left lateral foot. Wound surface looks about the same. Separating epithelium from the circumference. There really is no evidence of infection here however not making progress by my view 3/29; left lateral foot. Surface of the wound again looks reasonably healthy still thick skin and subcutaneous tissue around the wound margins. There is no evidence of infection. One of the concerns being brought up by the nurses has again the amount of drainage vis--vis continued use of a total contact cast 4/5; left lateral foot at roughly the base of the fifth metatarsal. Nice healthy looking granulated tissue  with rims of epithelialization. The overall wound measurements are not any better but the tissue looks healthy. The only concern is the amount of drainage although he has no surrounding maceration with what we have been doing recently to absorb fluid and protect his skin. He also has lymphedema. 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 He states that the kerlix/coban under the TCC rolled down and could not get it back up. He has been using an offloading scooter and has somehow hurt his right foot using this device. This happened last week. He states that the side of his right foot developed a blister and opened. The top of his foot also has a few small open wounds he thinks is due to his socks rubbing in his shoes. He has not been using any dressings to the wound. He denies purulent drainage, fever/chills or erythema to the wounds. 4/22; patient presents for 1 week follow-up. He developed new wounds to the right foot that were evaluated at last clinic visit. He continues to have a total contact cast to the left leg and he reports no issues. He has been using silver collagen to the right foot wounds with no issues. He denies purulent drainage, fever/chills or erythema to the right foot wounds. He has no complaints today 4/25; patient presents for 1 week follow-up. He has a total contact cast of the left leg and reports no issues. He has been using silver alginate to the right foot wound. He denies purulent drainage, fever/chills or erythema to the right foot wounds. 5/2 patient presents for 1 week follow-up. T contact cast on the left. The wound which is on the base of the plantar foot at the base of the fifth metatarsal otal actually looks quite good and  dimensions continue to gradually contract. HOWEVER the area on the right lateral fifth metatarsal head is much larger than what I remember from 2 weeks ago. Once more is he has significant levels of hypergranulation. Noteworthy that he had this same hyper granulated response on his wound on the left foot at one point  in time. So much so that he I thought there was an underlying fluid collection. Based on this I think this just needs debridement. 5/9; the wound on the left actually continues to be gradually smaller with a healthy surface. Slight amount of drainage and maceration of the skin around but not too bad. However he has a large wound over the right fifth metatarsal head very much in the same configuration as his left foot wound was initially. I used silver nitrate to address the hyper granulated tissue no mechanical debridement 5/16; area on the left foot did not look as healthy this week deeper thick surrounding macerated skin and subcutaneous tissue. The area on the right foot fifth met head was about the same The area on the right ankle that we identified last week is completely broken down into an open wound presumably a stocking rubbing issue 5/23; patient has been using a total contact cast to the left side. He has been using silver alginate underneath. He has also been using silver alginate to the right foot wounds. He has no complaints today. He denies any signs of infection. 5/31; the left-sided wound looks some better measure smaller surface granulation looks better. We have been using silver alginate under the total contact cast The large area on his right fifth met head and right dorsal foot look about the same still using silver alginate 6/6; neither side is good as I was hoping although the surface area dimensions are better. A lot of maceration on his left and right foot around the wound edge. Area on the dorsal right foot looks better. He says he was traveling. I am not sure  what does the amount of maceration around the plantar wounds may be drainage issues 6/13; in general the wound surfaces look quite good on both sides. Macerated skin and raised edges around the wound required debridement although in general especially on the left the surface area seems improved. The area on the right dorsal ankle is about the same I thought this would not be such a problem to close 6/20; not much change in either wound although the one on the right looks a little better. Both wounds have thick macerated edges to the skin requiring debridements. We have been using silver alginate. The area on the dorsal right ankle is still open I thought this would be closed. 6/28; patient comes in today with a marked deterioration in the right foot wound fifth met head. Wide area of exposed bone this is a drastic change from last time. The area on the left there we have been casting is stagnant. We have been using silver alginate in both wound areas. 7/5; bone culture I did for PCR last time was positive for Pseudomonas, group B strep, Enterococcus and Staph aureus. There was no suggestion of methicillin resistance or ampicillin resistant genes. This was resistant to tetracycline however He comes into the clinic today with the area over his right plantar fifth metatarsal head which had been doing so well 2 weeks ago completely necrotic feeling bone. I do not know that this is going to be salvageable. The left foot wound is certainly no smaller but it has a better surface and is superficial. 7/8; patient called in this morning to say that his total contact cast was rubbing against his foot. He states he is doing fine overall. He denies signs of infection. 7/12; continued deterioration in the wound over the right fifth metatarsal head crumbling bone. This is not going  to be salvageable. The patient agrees and wants to be referred to Dr. Doran Durand which we will attempt to arrange as soon as possible. I am  going to continue him on antibiotics as long as that takes so I will renew those today. The area on the left foot which is the base of the fifth metatarsal continues to look somewhat better. Healthy looking tissue no depth no debridement is necessary here. 7/20; the patient was kindly seen by Dr. Doran Durand of orthopedics on 10/19/2020. 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 Towards this end we have taken him out of the total contact cast on the left we will put him in a wrap with Hydrofera Blue. As I understand things surgery is planned for 7/21 7/27; patient had his surgery last Thursday. 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 Apparently everything went well we did not still disturb that today The area on the left foot actually looks quite good. He has been much less mobile which probably explains this he did not seem to do well in the total contact cast secondary to drainage and maceration I think. 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 Fortunately there is no evidence of active infection at this time. No fevers, chills, nausea, vomiting, or diarrhea. Overall I think that he is actually doing extremely well. Nonetheless I do believe that he is staying off of this more following the surgery in his right foot that is the reason the left is doing so great. 8/16; left plantar foot wound. This looks smaller than the last time I saw this he is using Hydrofera Blue. The surgical wound on the right foot is being followed by Dr. Doran Durand we did not look at this today. He has surgical shoes on both feet 8/23; left plantar foot wound not as good this week. Surrounding macerated skin and subcutaneous tissue everything looks moist and wet. I do not think he is offloading this adequately. He is using a  surgical shoe Apparently the right foot surgical wound is not open although I did not check his foot 8/31; left plantar foot lateral aspect. Much improved this week. He has no maceration. Some improvement in the surface area of the wound but most impressively the depth is come in we are using silver alginate. The patient is a Product/process development scientist. He is asked that we write him a letter so he can go back to work. I have also tried to see if we can write something that will allow him to limit the amount of time that he is on his foot at work. 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 Hopefully the latter can be avoided 9/6; I believe the patient missed an appointment last week. He arrives in today with a wound looking roughly the same certainly no better. Undermining laterally and also inferiorly. We used molecuLight today in training with the patient's permission.. We are using silver alginate 9/21 wound is measuring bigger this week although this may have to do with the aggressive circumferential debridement last week in response to the blush fluorescence on the MolecuLight. Culture I did last week showed significant MSSA and E. coli. I put  him on Augmentin but he has not started it yet. We are also going to send this for compounded antibiotics at [facility]. There is no evidence of systemic infection 9/29; silver alginate. His Keystone arrived. He is completing Augmentin in 2 days. Offloading in a cam boot. Moderate drainage per our intake staff 10/5; using silver alginate. He has been using his [place]. He has completed his Augmentin. Per our intake nurse still a lot of drainage, far too much to consider a total contact cast. Wound measures about the same. He had the same undermining area that I defined last week from a roughly 11-3. I remove this today 10/12; using silver alginate he is using the [place]. He comes  in for a nurse visit hence we are applying [employer] twice a week. Measuring slightly better today and less notable drainage. Extensive debridement of the wound edge last time 10/18; using topical Keystone and silver alginate and a soft cast. Wound measurements about the same. Drainage was through his soft cast. We are changing this twice a week Tuesdays and Friday 10/25; comes in with moderate drainage. Still using Keystone silver alginate and a soft cast. Wound dimensions completely the same.He has a lot of edema in the left leg he has lymphedema. Asking for [name] to consider wrapping him as he cannot get his stocking on over the soft cast 11/2; comes in with moderate to large drainage slightly smaller in terms of width we have been using [place]. His wound looks satisfactory but not much improvement 11/4; patient presents today for obligatory cast change. Has no issues or complaints today. He denies signs of infection. 11/9; patient traveled this weekend to [place], was on the cast quite a bit. Staining of the cast with black material from his walking boot. Drainage was not quite as bad as we feared. Using silver alginate and Keystone 11/16; we do not have size for cast therefore we have been putting a soft cast on him since the change on Friday. Still a significant amount of drainage necessitating changing twice a week. We have been using the Keystone at cast changes either hard or soft as well as silver alginate Comes in the clinic with things actually looking fairly good improvement in width. He says his offloading is about the same 02/24/2021 upon evaluation today patient actually comes back in and is doing excellent in regard to his foot ulcer this is significantly smaller even compared to the last visit. The soft cast seems to have done extremely well for him which is great news. I do not see any signs of infection minimal debridement will be needed today. 11/30; left lateral foot much improved  half a centimeter improvement in surface area. No evidence of infection. He seems to be doing better with the soft cast in the TCC therefore we will continue with this. He comes back in later in the week for a change with the nurses. This is due to drainage 12/6; no improvement in dimensions. Under illumination some debris on the surface we have been using silver alginate, soft cast. If there is anything optimistic here he seems to have have less drainage 12/13. Dimensions are improved both length and width and slightly in depth. Appears to be quite healthy today. Raised edges of this thick skin and callus around the edges however. He is in a soft cast were bringing him back once for a change on Friday. Drainage is better 12/20. Dimensions are improved. He still has raised edges of thick skin and  callus around the edges. We are using a soft cast 12/28; comes in today with thick callus around the wound. Using silver under alginate under a soft cast. I do not think there is much improvement in any measurement 2023 04/06/2021; patient was put in a total contact cast. Unfortunately not much change in surface area 1/10; not much different still thick callus and skin around the edge in spite of the total contact cast. This was just debrided last week we have been using the [facility] compounded antibiotic and silver alginate under a total contact cast 1/18 the patient's wound on the left side is doing nicely. smaller HOWEVER he comes in today with a wound on the right foot laterally. blister most likely serosangquenous drainage 1/24; the patient continues to do well in terms of the plantar left foot which is continued to contract using silver alginate under the total contact cast HOWEVER the right lateral foot is bigger with denuded skin around the edges. I used pickups and a #15 scalpel to remove this this looks like the remanence of a large blister. Cannot rule out infection. Culture in this area I did last  week showed Staphylococcus lugdunensis few colonies. I am going to try to address this with his [employer] antibiotic that is done so well on the left having linezolid and this should cover the staph 2/1; the patient's wound on his left foot which was the original plantar foot wound thick skin and eschar around the edges even in the total contact cast but the wound surface does not look too bad The real problem is on how his right lateral foot at roughly the base of the fifth metatarsal. The wound is completely necrotic more worrisome than that there is swelling around the edges of this. We have been using silver alginate on both wounds and Keystone on the right foot. Unfortunately I think he is going to require systemic antibiotics while we await cultures. He did not get the x-ray done that we ordered last week [lost the prescription 2/7; disappointingly in the area on the left foot which we are treating with a total contact cast is still not closed although it is much smaller. He continues to have a lot of callus around the wound edge. -Right lateral foot culture I did last week was negative x-ray also negative for osteomyelitis. 2/15: TCC silver alginate on the left and silver alginate on the right lateral. No real improvement in either area 05/26/2021: T oday, the wounds are roughly the same size as at his previous visit, post-debridement. He continues to endorse fairly substantial drainage, particularly on the right. He has been in a total contact cast on the left. There is still some callus surrounding this lesion. On the right, the periwound skin is quite macerated, along with surrounding callus. The center of the right-sided wound also has some dark, densely adherent material, which is very difficult to remove. 06/02/2021: Today, both wounds are slightly smaller. He has been using zinc oxide ointment around the right ulcer and the degree of maceration has improved markedly. There continues to be  an area of nonviable tissue in the center of the right sided ulcer. The left-sided wound, which has been in the total contact cast. Appears clean and the degree of callus around it is less than previously. 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 He had to take the stairs and both wounds have increased in size. The left  foot, which has been in a total contact cast, has developed a tunnel tracking to the lateral aspect of his foot. The nonviable tissue in the center of the right-sided ulcer remains recalcitrant to debridement. There is significant undermining surrounding the entirety of the left sided wound. 06/16/2021: The elevator at school has been fixed and the patient has been able to avoid putting as much weight on his wounds over the past week. We converted the left foot wound into a single lesion today, but despite this, the wound is actually smaller. The base is healthy with limited periwound callus. On the right, the central necrotic area is still present. He continues to be quite macerated around the right sided wound, despite applying barrier cream. This does, however, have the benefit of softening the callus to make it more easily removable. 06/23/2021: Today, the left wound is smaller. The lateral aspect that had opened up previously is now closed. The wound base has a healthy bed of granulation tissue and minimal slough. Unfortunately, on the right, the wound is larger and continues to be fairly macerated. He has also reopened the wound at his right ankle. He thinks this is due to the gait he has adopted secondary to his total contact cast and boot. 06/30/2021: T oday, both wounds are a little bit larger. The lateral aspect on the left has remained closed. He continues to have significant periwound maceration. The culture that I took from the right sided wound grew a population of bacteria that is not covered by his current  [facility] antibiotic. The center of the right- sided wound continues to appear necrotic with nonviable fat. It probes deeper today, but does not reach bone. 07/07/2021: The periwound maceration is a little bit less today. The right lateral foot wound has some areas that appear more viable and the necrotic center also looks a little bit better. The wound on the dorsal surface of his right foot near the ankle is contracting and the surface appears healthy. The left plantar wound surface looks healthy, but there is some new undermining on the medial portion. He did get his new Keystone antibiotic and began applying that to the right foot wound on Saturday. 07/14/2021: The intake nurse reported substantial drainage from his wounds, but the periwound skin actually looks better than is typical for him. The wound on the dorsal surface of his right foot near the ankle is smaller and just has a small open area underneath some dried eschar. The left plantar wound surface looks healthy and there has been no significant accumulation of callus. The right lateral foot wound looks quite a bit better, with the central portion, which typically appears necrotic, looking more viable albeit pale. 07/22/2021: His left foot is extremely macerated today. The wound is about the same size. The wound on the dorsal surface of his right foot near the ankle had closed, but he traumatized it removing the dressing and there is a tiny skin tear in that location. The right lateral foot wound is bigger, but the surface appears healthy. 07/30/2021: The wound on the dorsal surface of his right foot near the ankle is closed. The right lateral foot wound again is a little bit bigger due to some undermining. The periwound skin is in better condition, however. He has been applying zinc oxide. The wound surface is a little bit dry today. On the left, he does not have the substantial maceration that we frequently see. The wound itself is smaller  and has a  clean surface. 08/06/2021: Both wounds seem to have deteriorated over the past week. The right lateral foot wound has a dry surface but the periwound is boggy.. Overall wound dimensions are about the same. On the left, the wound is about the same size, but there is more undermining present underneath periwound callus. 08/13/2021: The right sided wound looks about the same, but on the left there has been substantial deterioration. The undermining continues to extend under periwound callus. Once this was removed, substantial extension of the wound was present. There is no odor or purulent drainage but clearly the wounds have broken down. 08/20/2021: The wounds look about the same today. He has been out of his total contact cast and has just been changing the dressings using topical Keystone with PolyMem Ag, Kerlix and Ace bandages. The wound on the top of his right ankle has reopened but this is quite small. There was a little bit of purulent material that I expressed when examining this wound. Electronic Signature(s) Signed: 08/20/2021 3:06:28 PM By: [name] MD FACS Entered By: [name] on 08/20/2021 15:06:28 -------------------------------------------------------------------------------- Physical Exam Details Patient Name: Date of Service: [name], [name] 08/20/2021 2:15 PM Medical Record Number: [id number] Patient Account Number: [id number] Date of Birth/Sex: Treating RN: [birth year] (36 y.o. [name] Primary Care Provider: [name] Other Clinician: Referring Provider: Treating Provider/Extender: [name] in Treatment: 95 Constitutional Slightly hypertensive. . . . No acute distress. Respiratory Normal work of breathing on room air. Notes 08/20/2021: His wounds look about the same this week. There is some undermining from 9:00 to 12:00 on the left foot. He has senescent tissue heaped up around the edges on the right.  The wound on his ankle is very small with just some overlying eschar. Electronic Signature(s) Signed: 08/20/2021 3:09:45 PM By: [name] MD FACS Entered By: [name] on 08/20/2021 15:09:45 -------------------------------------------------------------------------------- Physician Orders Details Patient Name: Date of Service: [name], [name] 08/20/2021 2:15 PM Medical Record Number: [id number] Patient Account Number: [id number] Date of Birth/Sex: Treating RN: [birth year] (36 y.o. 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 - Dr. [name] - Room 2 - Return A Bathing/ Shower/ Hygiene May shower with protection but do not get wound dressing(s) wet. - Use Cast Protector Bag on Right and left legs Edema Control - Lymphedema / SCD / Other Bilateral Lower Extremities Elevate legs to the level of the heart or above for 30 minutes daily and/or when sitting, a frequency of: - throughout the day Avoid standing for long periods of time. Exercise regularly Compression stocking or Garment 20-30 mm/Hg pressure to: - to right leg daily Off-Loading Open toe surgical shoe to: - right foot Other: - Soft cast to left foot Additional Orders / Instructions Follow Nutritious Diet Wound Treatment Wound #11 - Foot Wound Laterality: Right, Anterior Cleanser: Soap and Water 1 x Per Day/15 Days Discharge Instructions: May shower  and wash wound with dial antibacterial soap and water prior to dressing change. Cleanser: Byram Ancillary Kit - 15  Day Supply (Generic) 1 x Per Day/15 Days Discharge Instructions: Use supplies as instructed; Kit contains: (15) Saline Bullets; (15) 3x3 Gauze; 15 pr Gloves Topical: keystone antibiotic compound 1 x Per Day/15 Days Prim Dressing: PolyMem Silver Non-Adhesive Dressing, 4.25x4.25 in (Generic) 1 x Per Day/15 Days ary Discharge Instructions: Apply to wound bed as instructed Secondary Dressing: ABD Pad, 8x10 (Generic) 1 x Per Day/15 Days Discharge Instructions: Apply over primary dressing as directed. Secondary Dressing: Optifoam Non-Adhesive Dressing, 4x4 in (Generic) 1 x Per Day/15 Days Discharge Instructions: Apply over primary dressing as directed. Secondary Dressing: Zetuvit Plus 4x8 in 1 x Per Day/15 Days Discharge Instructions: Apply over primary dressing as directed. Secondary Dressing: NonWoven Sponge, 4x4 (in/in) (Generic) 1 x Per Day/15 Days Secured With: Coban Self-Adherent Wrap 4x5 (in/yd) (Generic) 1 x Per Day/15 Days Discharge Instructions: Secure with Coban as directed. Secured With: [employer], 4.5x3.1 (in/yd) (Generic) 1 x Per Day/15 Days Discharge Instructions: Secure with Kerlix as directed. Secured With: 46M Medipore H Soft Cloth Surgical T ape, 4 x 10 (in/yd) (Generic) 1 x Per Day/15 Days Discharge Instructions: Secure with tape as directed. Wound #3 - Foot Wound Laterality: Plantar, Left, Lateral Cleanser: Soap and Water 1 x Per Day/15 Days Discharge Instructions: May shower and wash wound with dial antibacterial soap and water prior to dressing change. Cleanser: Byram Ancillary Kit - 15 Day Supply (Generic) 1 x Per Day/15 Days Discharge Instructions: Use supplies as instructed; Kit contains: (15) Saline Bullets; (15) 3x3 Gauze; 15 pr Gloves Topical: keystone antibiotic compound 1 x Per Day/15 Days Prim Dressing: PolyMem Silver Non-Adhesive Dressing,  4.25x4.25 in (Generic) 1 x Per Day/15 Days ary Discharge Instructions: Apply to wound bed as instructed Secondary Dressing: ABD Pad, 8x10 (Generic) 1 x Per Day/15 Days Discharge Instructions: Apply over primary dressing as directed. Secondary Dressing: Optifoam Non-Adhesive Dressing, 4x4 in (Generic) 1 x Per Day/15 Days Discharge Instructions: Apply over primary dressing as directed. Secondary Dressing: Zetuvit Plus 4x8 in 1 x Per Day/15 Days Discharge Instructions: Apply over primary dressing as directed. Secondary Dressing: NonWoven Sponge, 4x4 (in/in) (Generic) 1 x Per Day/15 Days Secured With: Coban Self-Adherent Wrap 4x5 (in/yd) (Generic) 1 x Per Day/15 Days Discharge Instructions: Secure with Coban as directed. Secured With: [employer], 4.5x3.1 (in/yd) (Generic) 1 x Per Day/15 Days Discharge Instructions: Secure with Kerlix as directed. Secured With: 46M Medipore H Soft Cloth Surgical T ape, 4 x 10 (in/yd) (Generic) 1 x Per Day/15 Days Discharge Instructions: Secure with tape as directed. Wound #8 - Foot Wound Laterality: Right, Lateral Cleanser: Soap and Water 1 x Per KAJ/68 Days Discharge Instructions: May shower and wash wound with dial antibacterial soap and water prior to dressing change. Cleanser: Byram Ancillary Kit - 15 Day Supply (Generic) 1 x Per Day/15 Days Discharge Instructions: Use supplies as instructed; Kit contains: (15) Saline Bullets; (15) 3x3 Gauze; 15 pr Gloves Topical: keystone antibiotic compound 1 x Per Day/15 Days Prim Dressing: PolyMem Silver Non-Adhesive Dressing, 4.25x4.25 in (Generic) 1 x Per Day/15 Days ary Discharge Instructions: Apply to wound bed as instructed Secondary Dressing: ABD Pad, 8x10 (Generic) 1 x Per Day/15 Days Discharge Instructions: Apply over primary dressing as directed. Secondary Dressing: Optifoam Non-Adhesive Dressing, 4x4 in (Generic) 1 x Per Day/15 Days Discharge Instructions: Apply over primary dressing as  directed. Secondary Dressing: Zetuvit Plus 4x8 in 1 x Per Day/15 Days Discharge Instructions: Apply over primary dressing as directed. Secondary Dressing: NonWoven Sponge, 4x4 (in/in) (Generic) 1 x Per Day/15  Days Secured With: Coban Self-Adherent Wrap 4x5 (in/yd) (Generic) 1 x Per Day/15 Days Discharge Instructions: Secure with Coban as directed. Secured With: [employer], 4.5x3.1 (in/yd) (Generic) 1 x Per Day/15 Days Discharge Instructions: Secure with Kerlix as directed. Secured With: 64M Medipore H Soft Cloth Surgical T ape, 4 x 10 (in/yd) (Generic) 1 x Per Day/15 Days Discharge Instructions: Secure with tape as directed. Electronic Signature(s) Signed: 08/23/2021 7:39:29 AM By: [name] MD FACS Entered By: [name] on 08/20/2021 15:10:03 -------------------------------------------------------------------------------- Problem List Details Patient Name: Date of Service: [name], [name] 08/20/2021 2:15 PM Medical Record Number: [id number] Patient Account Number: [id number] Date of Birth/Sex: Treating RN: 01-09-1986 (36 y.o. 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 08/10/2020 No Yes Inactive Problems ICD-10 Code Description Active Date Inactive Date L97.518 Non-pressure chronic ulcer of other part of right foot with other specified severity 07/14/2020 07/14/2020 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, right ankle and foot 10/24/2019 10/24/2019 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, left ankle and foot 10/24/2019 10/24/2019 B95.62 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 10/24/2019 10/24/2019 classified elsewhere Resolved Problems Electronic Signature(s) Signed: 08/20/2021 2:58:54 PM By: [name] MD FACS Entered By: [name] on 08/20/2021 14:58:54 -------------------------------------------------------------------------------- Progress Note Details Patient Name: Date of Service: [name], [name] 08/20/2021 2:15 PM Medical Record Number: [id number] Patient Account Number: [id number] Date of Birth/Sex: Treating RN: 1985-09-02 (36 y.o. 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 He comes in for review of a wound over the plantar fifth metatarsal head extending into the lateral part of the foot. He was followed for this previously by his podiatrist Dr. Cornelius Moras. As the patient tells his story he went to see podiatry first for a swelling he developed on the lateral part of his fifth metatarsal head in May. He states this was "open" by podiatry and the area closed. He was followed up in June and it was again opened callus removed and it closed promptly. There were plans being made for surgery on the fifth metatarsal head in June  however his blood sugar was apparently too high for anesthesia. Apparently the area was debrided and opened again in June and it is never closed since. Looking over the records from podiatry I am really not able to follow this. It was clear when he was first seen it was before 5/14 at that point he already had a wound. By 5/17 the ulcer was resolved. I do not see anything about a procedure. On 5/28 noted to have  pre-ulcerative moderate keratosis. X-ray noted 1/5 contracted toe and tailor's bunion and metatarsal deformity. On a visit date on 09/28/2018 the dorsal part of the left foot it healed and resolved. There was concern about swelling in his lower extremity he was sent to the ER.. As far as I can tell he was seen in the ER on 7/12 with an ulcer on his left foot. A DVT rule out of the left leg was negative. I do not think I have complete records from podiatry but I am not able to verify the procedures this patient states he had. He states after the last procedure the wound has never closed although I am not able to follow this in the records I have from podiatry. He has not had a recent x-ray The patient has been using Neosporin on the wound. He is wearing a Darco shoe. He is still very active up on his foot working and exercising. Past medical history; type 2 diabetes ketosis-prone, leg swelling with a negative DVT study in July. Non-smoker ABI in our clinic was 0.85 on the left 10/16; substantial wound on the plantar left fifth met head extending laterally almost to the dorsal fifth MTP. We have been using silver alginate we gave him a Darco forefoot off loader. An x-ray did not show evidence of osteomyelitis did note soft tissue emphysema which I think was due to gas tracking through an open wound. There is no doubt in my mind he requires an MRI 10/23; MRI not booked until 3 November at the earliest this is largely due to his glucose sensor in the right arm. We have been using silver alginate.  There has been an improvement 10/29; I am still not exactly sure when his MRI is booked for. He says it is the third but it is the 10th in epic. This definitely needs to be done. He is running a low-grade fever today but no other symptoms. No real improvement in the 1 02/26/2019 patient presents today for a follow-up visit here in our clinic he is last been seen in the clinic on October 29. Subsequently we were working on getting MRI to evaluate and see what exactly was going on and where we would need to go from the standpoint of whether or not he had osteomyelitis and again what treatments were going be required. Subsequently the patient ended up being admitted to the hospital on 02/07/2019 and was discharged on 02/14/2019. This is a somewhat interesting admission with a discharge diagnosis of pneumonia due to COVID-19 although he was positive for COVID-19 when tested at the urgent care but negative x2 when he was actually in the hospital. With that being said he did have acute respiratory failure with hypoxia and it was noted he also have a left foot ulceration with osteomyelitis. With that being said he did require oxygen for his pneumonia and I level 4 L. He was placed on antivirals and steroids for the COVID-19. He was also transferred to the [facility] at one point. Nonetheless he did subsequently discharged home and since being home has done much better in that regard. The CT angiogram did not show any pulmonary embolism. With regard to the osteomyelitis the patient was placed on vancomycin and Zosyn while in the hospital but has been changed to Augmentin at discharge. It was also recommended that he follow- up with wound care and podiatry. Podiatry however wanted him to see [name] according to the patient prior to them doing anything further.  His hemoglobin A1c was 9.9 as noted in the hospital. Have an MRI of the left foot performed while in the hospital on 02/04/2019. This showed evidence  of septic arthritis at the fifth MTP joint and osteomyelitis involving the fifth metatarsal head and proximal phalanx. There is an overlying plantar open wound noted an abscess tracking back along the lateral aspect of the fifth metatarsal shaft. There is otherwise diffuse cellulitis and mild fasciitis without findings of polymyositis. The patient did have recently pneumonia secondary to COVID-19 I looked in the chart through epic and it does appear that the patient may need to have an additional x-ray just to ensure everything is cleared and that he has no airspace disease prior to putting him into the [name]. 03/05/2019; patient was readmitted to the clinic last week. He was hospitalized twice for a viral upper respiratory tract infection from 11/1 through 11/4 and then 11/5 through 11/12 ultimately this turned out to be Covid pneumonitis. Although he was discharged on oxygen he is not using it. He says he feels fine. He has no exercise limitation no cough no sputum. His O2 sat in our clinic today was 100% on room air. 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 He received Vanco and Zosyn in the hospital and then was discharged on 2 weeks of Augmentin. I do not see any relevant cultures. He was supposed to follow-up with infectious disease but I do not see that he has an appointment. 12/8; patient saw Dr. Novella Olive of infectious disease last week. He felt that he had had adequate antibiotic therapy. He did not go to follow-up with Dr. Amalia Hailey of podiatry and I have again talked to him about the pros and cons of this. He does not want to consider a ray amputation of this time. He is aware of the risks of recurrence, migration etc. He started HBO today and tolerated this well. He can complete the Augmentin that I gave him last week. I have looked over the lab work that Dr. Chana Bode ordered his  C-reactive protein was 3.3 and his sedimentation rate was 17. The C-reactive protein is never really been measurably that high in this patient 12/15; not much change in the wound today however he has undermining along the lateral part of the foot again more extensively than last week. He has some rims of epithelialization. We have been using silver alginate. He is undergoing hyperbarics but did not dive today 12/18; in for his obligatory first total contact cast change. Unfortunately there was pus coming from the undermining area around his fifth metatarsal head. This was cultured but will preclude reapplication of a cast. He is seen in conjunction with HBO 12/24; patient had staph lugdunensis in the wound in the undermining area laterally last time. We put him on doxycycline which should have covered this. The wound looks better today. I am going to give him another week of doxycycline before reattempting the total contact cast 12/31; the patient is completing antibiotics. Hemorrhagic debris in the distal part of the wound with some undermining distally. He also had hyper granulation. Extensive debridement with a #5 curette. The infected area that was on the lateral part of the fifth met head is closed over. I do not think he needs any more antibiotics. Patient was seen prior to HBO. Preparations for a total contact cast were made in the cast will be  placed post hyperbarics 04/11/19; once again the patient arrives today without complaint. He had been in a cast all week noted that he had heavy drainage this week. This resulted in large raised areas of macerated tissue around the wound 1/14; wound bed looks better slightly smaller. Hydrofera Blue has been changing himself. He had a heavy drainage last week which caused a lot of maceration around the wound so I took him out of a total contact cast he says the drainage is actually better this week He is seen today in conjunction with HBO 1/21; returns to  clinic. He was up in [place] for a day or 2 attending a funeral. He comes back in with the wound larger and with a large area of exposed bone. He had osteomyelitis and septic arthritis of the fifth left metatarsal head while he was in hospital. He received IV antibiotics in the hospital for a prolonged period of time then 3 weeks of Augmentin. Subsequently I gave him 2 weeks of doxycycline for more superficial wound infection. When I saw this last week the wound was smaller the surface of the wound looks satisfactory. 1/28; patient missed hyperbarics today. Bone biopsy I did last time showed Enterococcus faecalis and Staphylococcus lugdunensis . He has a wide area of exposed bone. We are going to use silver alginate as of today. I had another ethical discussion with the patient. 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 In this situation I think the likelihood of healing this is low. Therefore I have recommended a ray amputation and with the patient's agreement I have referred him to Dr. Doran Durand. The other issue is that his compliance with hyperbarics has been minimal because of his work schedule and given his underlying decision I am going to stop this today READMISSION 10/24/2019 MRI 09/29/2019 left foot IMPRESSION: 1. Apparent skin ulceration inferior and lateral to the 5th metatarsal base with underlying heterogeneous T2 signal and enhancement in the subcutaneous fat. Small peripherally enhancing fluid collections along the plantar and lateral aspects of the 5th metatarsal base suspicious for abscesses. 2. Interval amputation through the mid 5th metatarsal with nonspecific low-level marrow edema and enhancement. Given the proximity to the adjacent soft tissue inflammatory changes, osteomyelitis cannot be excluded. 3. The additional bones appear unremarkable. MRI 09/29/2019 right foot IMPRESSION: 1. Soft tissue ulceration lateral to the 5th MTP joint.  There is low-level T2 hyperintensity within the 4th and 5th metatarsal heads and adjacent proximal phalanges without abnormal T1 signal or cortical destruction. These findings are nonspecific and could be seen with early marrow edema, hyperemia or early osteomyelitis. No evidence of septic joint. 2. Mild tenosynovitis and synovial enhancement associated with the extensor digitorum tendons at the level of the midfoot. 3. Diffuse low-level muscular T2 hyperintensity and enhancement, most consistent with diabetic myopathy. LEFT FOOT BONE Methicillin resistant staphylococcus aureus Staphylococcus lugdunensis MIC MIC CIPROFLOXACIN >=8 RESISTANT Resistant <=0.5 SENSI... Sensitive CLINDAMYCIN <=0.25 SENS... Sensitive >=8 RESISTANT Resistant ERYTHROMYCIN >=8 RESISTANT Resistant >=8 RESISTANT Resistant GENTAMICIN <=0.5 SENSI... Sensitive <=0.5 SENSI... Sensitive Inducible Clindamycin NEGATIVE Sensitive NEGATIVE Sensitive OXACILLIN >=4 RESISTANT Resistant 2 SENSITIVE Sensitive RIFAMPIN <=0.5 SENSI... Sensitive <=0.5 SENSI... Sensitive TETRACYCLINE <=1 SENSITIVE Sensitive <=1 SENSITIVE Sensitive TRIMETH/SULFA <=10 SENSIT Sensitive <=10 SENSIT Sensitive ... Marland Kitchen.. VANCOMYCIN 1 SENSITIVE Sensitive <=0.5 SENSI... Sensitive Right foot bone . Component 3 wk ago Specimen Description BONE Special Requests RIGHT 4 METATARSAL SAMPLE B Gram Stain NO WBC SEEN NO ORGANISMS SEEN Culture RARE METHICILLIN RESISTANT STAPHYLOCOCCUS AUREUS NO  ANAEROBES ISOLATED Performed at [facility], [place] [address], [place], [place] [id number] Report Status 10/08/2019 FINAL Organism ID, Bacteria METHICILLIN RESISTANT STAPHYLOCOCCUS AUREUS Resulting Agency [facility] Susceptibility Methicillin resistant staphylococcus aureus MIC CIPROFLOXACIN >=8 RESISTANT Resistant CLINDAMYCIN <=0.25 SENS... Sensitive ERYTHROMYCIN >=8 RESISTANT Resistant GENTAMICIN <=0.5 SENSI... Sensitive Inducible Clindamycin NEGATIVE  Sensitive OXACILLIN >=4 RESISTANT Resistant RIFAMPIN <=0.5 SENSI... Sensitive TETRACYCLINE <=1 SENSITIVE Sensitive TRIMETH/SULFA <=10 SENSIT Sensitive ... VANCOMYCIN 1 SENSITIVE Sensitive This is a patient we had in clinic earlier this year with a wound over his left fifth metatarsal head. 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 Doran Durand on 05/16/2019. He seems to have developed problems with wounds on his bilateral feet in June 2021 although he may have had problems earlier than this. He was in an urgent care with a right foot ulcer on 09/26/2019 and given a course of doxycycline. This was apparently after having trouble getting into see orthopedics. He was seen by podiatry on 09/28/2019 noted to have bilateral lower extremity ulcers including the left lateral fifth metatarsal base and the right subfifth met head. It was noted that had purulent drainage at that time. He required hospitalization from 6/20 through 7/2. This was because of worsening right foot wounds. He underwent bilateral operative incision and drainage and bone biopsies bilaterally. Culture results are listed above. He has been referred back to clinic by Dr. Jacqualyn Posey of podiatry. He is also followed by Dr. Megan Salon who saw him yesterday. He was discharged from hospital on Zyvox Flagyl and Levaquin and yesterday changed to doxycycline Flagyl and Levaquin. His inflammatory markers on 6/26 showed a sedimentation rate of 129 and a C-reactive protein of 5. This is improved to 14 and 1.3 respectively. This would indicate improvement. ABIs in our clinic today were 1.23 on the right and 1.20 on the left 11/01/2019 on evaluation today patient appears to be doing  fairly well in regard to the wounds on his feet at this point. Fortunately there is no signs of active infection at this time. No fevers, chills, nausea, vomiting, or diarrhea. He currently is seeing infectious disease and still under their care at this point. Subsequently he also has both wounds which she has not been using collagen on as he did not receive that in his packaging he did not call us and let us know that. Apparently that just was missed on the order. Nonetheless we will get that straightened out today. 8/9-Patient returns for bilateral foot wounds, using Prisma with hydrogel moistened dressings, and the wounds appear stable. Patient using surgical shoes, avoiding much pressure or weightbearing as much as possible 8/16; patient has bilateral foot wounds. 1 on the right lateral foot proximally the other is on the left mid lateral foot. Both required debridement of callus and thick skin around the wounds. We have been using silver collagen 8/27; patient has bilateral lateral foot wounds. The area on the left substantially surrounded by callus and dry skin. This was removed from the wound edge. The underlying wound is small. The area on the right measured somewhat smaller today. 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 Unfortunately I did not update his antibiotics during today's visit. 9/10  I reviewed Dr. Hale Bogus last notes he felt he had completed antibiotics his inflammatory markers were reasonably well controlled. He has a small wound on the lateral left foot and a tiny area on the right which is just above closed. He is using Hydrofera Blue with border foam he has bilateral surgical shoes 9/24; 2 week f/u. doing well. right foot is closed. left foot still undermined. 10/14; right foot remains closed at the fifth met head. The area over the base of the left fifth metatarsal has a small open area but considerable  undermining towards the plantar foot. Thick callus skin around this suggests an adequate pressure relief. We have talked about this. He says he is going to go back into his cam boot. I suggested a total contact cast he did not seem enamored with this suggestion 10/26; left foot base of the fifth metatarsal. Same condition as last time. He has skin over the area with an open wound however the skin is not adherent. He went to see Dr. Earleen Newport who did an x-ray and culture of his foot I have not reviewed the x-ray but the patient was not told anything. He is on doxycycline 11/11; since the patient was last here he was in the emergency room on 10/30 he was concerned about swelling in the left foot. They did not do any cultures or x-rays. They changed his antibiotics to cephalexin. Previous culture showed group B strep. The cephalexin is appropriate as doxycycline has less than predictable coverage. Arrives in clinic today with swelling over this area under the wound. He also has a new wound on the right fifth metatarsal head 11/18; the patient has a difficult wound on the lateral aspect of the left fifth metatarsal head. The wound was almost ballotable last week I opened it slightly expecting to see purulence however there was just bleeding. I cultured this this was negative. X-ray unchanged. We are trying to get an MRI but I am not sure were going to be able to get this through his insurance. He also has an area on the right lateral fifth metatarsal head this looks healthier 12/3; the patient finally got our MRI. Surprisingly this did not show osteomyelitis. I did show the soft tissue ulceration at the lateral plantar aspect of the fifth metatarsal base with a tiny residual 6 mm abscess overlying the superficial fascia I have tried to culture this area I have not been able to get this to grow anything. Nevertheless the protruding tissue looks aggravated. I suspect we should try to treat the underlying  "abscess with broad-spectrum antibiotics. I am going to start him on Levaquin and Flagyl. He has much less edema in his legs and I am going to continue to wrap his legs and see him weekly 12/10. I started Levaquin and Flagyl on him last week. He just picked up the Flagyl apparently there was some delay. The worry is the wound on the left fifth metatarsal base which is substantial and worsening. His foot looks like he inverts at the ankle making this a weightbearing surface. Certainly no improvement in fact I think the measurements of this are somewhat worse. We have been using 12/17; he apparently just got the Levaquin yesterday this is 2 weeks after the fact. He has completed the Flagyl. The area over the left fifth metatarsal base still has protruding granulation tissue although it does not look quite as bad as it did some weeks ago. He has severe bilateral lymphedema although we have  not been treating him for wounds on his legs this is definitely going to require compression. There was so much edema in the left I did not wish to put him in a total contact cast today. I am going to increase his compression from 3-4 layer. The area on the right lateral fifth met head actually look quite good and superficial. 12/23; patient arrived with callus on the right fifth met head and the substantial hyper granulated callused wound on the base of his fifth metatarsal. He says he is completing his Levaquin in 2 days but I do not think that adds up with what I gave him but I will have to double check this. We are using Hydrofera Blue on both areas. My plan is to put the left leg in a cast the week after New Year's 04/06/2020; patient's wounds about the same. Right lateral fifth metatarsal head and left lateral foot over the base of the fifth metatarsal. There is undermining on the left lateral foot which I removed before application of total contact cast continuing with Hydrofera Blue new. Patient tells me he was  seen by endocrinology today lab work was done [Dr. Kerr]. Also wondering whether he was referred to cardiology. I went over some lab work from previously does not have chronic renal failure certainly not nephrotic range proteinuria he does have very poorly controlled diabetes but this is not his most updated lab work. Hemoglobin A1c has been over 11 1/10; the patient had a considerable amount of leakage towards mid part of his left foot with macerated skin however the wound surface looks better the area on the right lateral fifth met head is better as well. I am going to change the dressing on the left foot under the total contact cast to silver alginate, continue with Hydrofera Blue on the right. 1/20; patient was in the total contact cast for 10 days. Considerable amount of drainage although the skin around the wound does not look too bad on the left foot. The area on the right fifth metatarsal head is closed. Our nursing staff reports large amount of drainage out of the left lateral foot wound 1/25; continues with copious amounts of drainage described by our intake staff. PCR culture I did last week showed E. coli and Enterococcus faecalis and low quantities. Multiple resistance genes documented including extended spectrum beta lactamase, MRSA, MRSE, quinolone, tetracycline. The wound is not quite as good this week as it was 5 days ago but about the same size 2/3; continues with copious amounts of malodorous drainage per our intake nurse. The PCR culture I did 2 weeks ago showed E. coli and low quantities of Enterococcus. There were multiple resistance genes detected. I put Neosporin on him last week although this does not seem to have helped. The wound is slightly deeper today. Offloading continues to be an issue here although with the amount of drainage she has a total contact cast is just not going to work 2/10; moderate amount of drainage. Patient reports he cannot get his stocking on over the  dressing. I told him we have to do that the nurse gave him suggestions on how to make this work. The wound is on the bottom and lateral part of his left foot. Is cultured predominantly grew low amounts of Enterococcus, E. coli and anaerobes. There were multiple resistance genes detected including extended spectrum beta lactamase, quinolone, tetracycline. I could not think of an easy oral combination to address this so for now I  am going to do topical antibiotics provided by [facility] I think the main agents here are vancomycin and an aminoglycoside. We have to be able to give him access to the wounds to get the topical antibiotic on 2/17; moderate amount of drainage this is unchanged. He has his Keystone topical antibiotic against the deep tissue culture organisms. He has been using this and changing the dressing daily. Silver alginate on the wound surface. 2/24; using Keystone antibiotic with silver alginate on the top. He had too much drainage for a total contact cast at one point although I think that is improving and I think in the next week or 2 it might be possible to replace a total contact cast I did not do this today. In general the wound surface looks healthy however he continues to have thick rims of skin and subcutaneous tissue around the wide area of the circumference which I debrided 06/04/2020 upon evaluation today patient appears to be doing well in regard to his wound. I do feel like he is showing signs of improvement. There is little bit of callus and dead tissue around the edges of the wound as well as what appears to be a little bit of a sinus tract that is off to the side laterally I would perform debridement to clear that away today. 3/17; left lateral foot. The wound looks about the same as I remember. Not much depth surface looks healthy. No evidence of infection 3/25; left lateral foot. Wound surface looks about the same. Separating epithelium from the circumference. There really  is no evidence of infection here however not making progress by my view 3/29; left lateral foot. Surface of the wound again looks reasonably healthy still thick skin and subcutaneous tissue around the wound margins. There is no evidence of infection. One of the concerns being brought up by the nurses has again the amount of drainage vis--vis continued use of a total contact cast 4/5; left lateral foot at roughly the base of the fifth metatarsal. Nice healthy looking granulated tissue with rims of epithelialization. The overall wound measurements are not any better but the tissue looks healthy. The only concern is the amount of drainage although he has no surrounding maceration with what we have been doing recently to absorb fluid and protect his skin. He also has lymphedema. 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 He states that the kerlix/coban under the TCC rolled down and could not get it back up. He has been using an offloading scooter and has somehow hurt his right foot using this device. This happened last week. He states that the side of his right foot developed a blister and opened. The top of his foot also has a few small open wounds he thinks is due to his socks rubbing in his shoes. He has not been using any dressings to the wound. He denies purulent drainage, fever/chills or erythema to the wounds. 4/22; patient presents for 1 week follow-up. He developed new wounds to the right foot that were evaluated at last clinic visit. He continues to have a total contact cast to the left leg and he reports no issues. He has been using silver collagen to the right foot wounds with no issues.  He denies purulent drainage, fever/chills or erythema to the  right foot wounds. He has no complaints today 4/25; patient presents for 1 week follow-up. He has a total contact cast of the left leg and reports no issues. He has been using silver alginate to the right foot wound. He denies purulent drainage, fever/chills or erythema to the right foot wounds. 5/2 patient presents for 1 week follow-up. T contact cast on the left. The wound which is on the base of the plantar foot at the base of the fifth metatarsal otal actually looks quite good and dimensions continue to gradually contract. HOWEVER the area on the right lateral fifth metatarsal head is much larger than what I remember from 2 weeks ago. Once more is he has significant levels of hypergranulation. Noteworthy that he had this same hyper granulated response on his wound on the left foot at one point in time. So much so that he I thought there was an underlying fluid collection. Based on this I think this just needs debridement. 5/9; the wound on the left actually continues to be gradually smaller with a healthy surface. Slight amount of drainage and maceration of the skin around but not too bad. However he has a large wound over the right fifth metatarsal head very much in the same configuration as his left foot wound was initially. I used silver nitrate to address the hyper granulated tissue no mechanical debridement 5/16; area on the left foot did not look as healthy this week deeper thick surrounding macerated skin and subcutaneous tissue. oo The area on the right foot fifth met head was about the same oo The area on the right ankle that we identified last week is completely broken down into an open wound presumably a stocking rubbing issue 5/23; patient has been using a total contact cast to the left side. He has been using silver alginate underneath. He has also been using silver alginate to the right foot wounds. He has no complaints today. He denies any signs of infection. 5/31; the  left-sided wound looks some better measure smaller surface granulation looks better. We have been using silver alginate under the total contact cast oo The large area on his right fifth met head and right dorsal foot look about the same still using silver alginate 6/6; neither side is good as I was hoping although the surface area dimensions are better. A lot of maceration on his left and right foot around the wound edge. Area on the dorsal right foot looks better. He says he was traveling. I am not sure what does the amount of maceration around the plantar wounds may be drainage issues 6/13; in general the wound surfaces look quite good on both sides. Macerated skin and raised edges around the wound required debridement although in general especially on the left the surface area seems improved. oo The area on the right dorsal ankle is about the same I thought this would not be such a problem to close 6/20; not much change in either wound although the one on the right looks a little better. Both wounds have thick macerated edges to the skin requiring debridements. We have been using silver alginate. The area on the dorsal right ankle is still open I thought this would be closed. 6/28; patient comes in today with a marked deterioration in the right foot wound fifth met head. Wide area of exposed bone this is a drastic change from last time. The area on the left there we  have been casting is stagnant. We have been using silver alginate in both wound areas. 7/5; bone culture I did for PCR last time was positive for Pseudomonas, group B strep, Enterococcus and Staph aureus. There was no suggestion of methicillin resistance or ampicillin resistant genes. This was resistant to tetracycline however He comes into the clinic today with the area over his right plantar fifth metatarsal head which had been doing so well 2 weeks ago completely necrotic feeling bone. I do not know that this is going to be  salvageable. The left foot wound is certainly no smaller but it has a better surface and is superficial. 7/8; patient called in this morning to say that his total contact cast was rubbing against his foot. He states he is doing fine overall. He denies signs of infection. 7/12; continued deterioration in the wound over the right fifth metatarsal head crumbling bone. This is not going to be salvageable. The patient agrees and wants to be referred to Dr. Doran Durand which we will attempt to arrange as soon as possible. I am going to continue him on antibiotics as long as that takes so I will renew those today. The area on the left foot which is the base of the fifth metatarsal continues to look somewhat better. Healthy looking tissue no depth no debridement is necessary here. 7/20; the patient was kindly seen by Dr. Doran Durand of orthopedics on 10/19/2020. 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 Towards this end we have taken him out of the total contact cast on the left we will put him in a wrap with Hydrofera Blue. As I understand things surgery is planned for 7/21 7/27; patient had his surgery last Thursday. 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 Apparently everything went well we did not still disturb that today The area on the left foot actually looks quite good. He has been much less mobile which probably explains this he did not seem to do well in the total contact cast secondary to drainage and maceration I think. 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 Fortunately there is no evidence of active infection at this time. No fevers, chills, nausea, vomiting, or diarrhea. Overall I think that he is actually doing extremely well. Nonetheless I do believe that he is staying off of this more following  the surgery in his right foot that is the reason the left is doing so great. 8/16; left plantar foot wound. This looks smaller than the last time I saw this he is using Hydrofera Blue. The surgical wound on the right foot is being followed by Dr. Doran Durand we did not look at this today. He has surgical shoes on both feet 8/23; left plantar foot wound not as good this week. Surrounding macerated skin and subcutaneous tissue everything looks moist and wet. I do not think he is offloading this adequately. He is using a surgical shoe Apparently the right foot surgical wound is not open although I did not check his foot 8/31; left plantar foot lateral aspect. Much improved this week. He has no maceration. Some improvement in the surface area of the wound but most impressively the depth is come in we are using silver alginate. The patient is a Product/process development scientist. He is asked  that we write him a letter so he can go back to work. I have also tried to see if we can write something that will allow him to limit the amount of time that he is on his foot at work. 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 Hopefully the latter can be avoided 9/6; I believe the patient missed an appointment last week. He arrives in today with a wound looking roughly the same certainly no better. Undermining laterally and also inferiorly. We used molecuLight today in training with the patient's permission.. We are using silver alginate 9/21 wound is measuring bigger this week although this may have to do with the aggressive circumferential debridement last week in response to the blush fluorescence on the MolecuLight. Culture I did last week showed significant MSSA and E. coli. I put him on Augmentin but he has not started it yet. We are also going to send this for compounded antibiotics at [facility]. There is no evidence of systemic infection 9/29;  silver alginate. His Keystone arrived. He is completing Augmentin in 2 days. Offloading in a cam boot. Moderate drainage per our intake staff 10/5; using silver alginate. He has been using his [place]. He has completed his Augmentin. Per our intake nurse still a lot of drainage, far too much to consider a total contact cast. Wound measures about the same. He had the same undermining area that I defined last week from a roughly 11-3. I remove this today 10/12; using silver alginate he is using the [place]. He comes in for a nurse visit hence we are applying [employer] twice a week. Measuring slightly better today and less notable drainage. Extensive debridement of the wound edge last time 10/18; using topical Keystone and silver alginate and a soft cast. Wound measurements about the same. Drainage was through his soft cast. We are changing this twice a week Tuesdays and Friday 10/25; comes in with moderate drainage. Still using Keystone silver alginate and a soft cast. Wound dimensions completely the same.He has a lot of edema in the left leg he has lymphedema. Asking for [name] to consider wrapping him as he cannot get his stocking on over the soft cast 11/2; comes in with moderate to large drainage slightly smaller in terms of width we have been using [place]. His wound looks satisfactory but not much improvement 11/4; patient presents today for obligatory cast change. Has no issues or complaints today. He denies signs of infection. 11/9; patient traveled this weekend to DC, was on the cast quite a bit. Staining of the cast with black material from his walking boot. Drainage was not quite as bad as we feared. Using silver alginate and Keystone 11/16; we do not have size for cast therefore we have been putting a soft cast on him since the change on Friday. Still a significant amount of drainage necessitating changing twice a week. We have been using the Keystone at cast changes either hard or soft as  well as silver alginate Comes in the clinic with things actually looking fairly good improvement in width. He says his offloading is about the same 02/24/2021 upon evaluation today patient actually comes back in and is doing excellent in regard to his foot ulcer this is significantly smaller even compared to the last visit. The soft cast seems to have done extremely well for him which is great news. I do not see any signs of  infection minimal debridement will be needed today. 11/30; left lateral foot much improved half a centimeter improvement in surface area. No evidence of infection. He seems to be doing better with the soft cast in the TCC therefore we will continue with this. He comes back in later in the week for a change with the nurses. This is due to drainage 12/6; no improvement in dimensions. Under illumination some debris on the surface we have been using silver alginate, soft cast. If there is anything optimistic here he seems to have have less drainage 12/13. Dimensions are improved both length and width and slightly in depth. Appears to be quite healthy today. Raised edges of this thick skin and callus around the edges however. He is in a soft cast were bringing him back once for a change on Friday. Drainage is better 12/20. Dimensions are improved. He still has raised edges of thick skin and callus around the edges. We are using a soft cast 12/28; comes in today with thick callus around the wound. Using silver under alginate under a soft cast. I do not think there is much improvement in any measurement 2023 04/06/2021; patient was put in a total contact cast. Unfortunately not much change in surface area 1/10; not much different still thick callus and skin around the edge in spite of the total contact cast. This was just debrided last week we have been using the [facility] compounded antibiotic and silver alginate under a total contact cast 1/18 the patient's wound on the left side is  doing nicely. smaller HOWEVER he comes in today with a wound on the right foot laterally. blister most likely serosangquenous drainage 1/24; the patient continues to do well in terms of the plantar left foot which is continued to contract using silver alginate under the total contact cast HOWEVER the right lateral foot is bigger with denuded skin around the edges. I used pickups and a #15 scalpel to remove this this looks like the remanence of a large blister. Cannot rule out infection. Culture in this area I did last week showed Staphylococcus lugdunensis few colonies. I am going to try to address this with his [employer] antibiotic that is done so well on the left having linezolid and this should cover the staph 2/1; the patient's wound on his left foot which was the original plantar foot wound thick skin and eschar around the edges even in the total contact cast but the wound surface does not look too bad The real problem is on how his right lateral foot at roughly the base of the fifth metatarsal. The wound is completely necrotic more worrisome than that there is swelling around the edges of this. We have been using silver alginate on both wounds and Keystone on the right foot. Unfortunately I think he is going to require systemic antibiotics while we await cultures. He did not get the x-ray done that we ordered last week [lost the prescription 2/7; disappointingly in the area on the left foot which we are treating with a total contact cast is still not closed although it is much smaller. He continues to have a lot of callus around the wound edge. -Right lateral foot culture I did last week was negative x-ray also negative for osteomyelitis. 2/15: TCC silver alginate on the left and silver alginate on the right lateral. No real improvement in either area 05/26/2021: T oday, the wounds are roughly the same size as at his previous visit, post-debridement. He continues to endorse fairly  substantial  drainage, particularly on the right. He has been in a total contact cast on the left. There is still some callus surrounding this lesion. On the right, the periwound skin is quite macerated, along with surrounding callus. The center of the right-sided wound also has some dark, densely adherent material, which is very difficult to remove. 06/02/2021: Today, both wounds are slightly smaller. He has been using zinc oxide ointment around the right ulcer and the degree of maceration has improved markedly. There continues to be an area of nonviable tissue in the center of the right sided ulcer. The left-sided wound, which has been in the total contact cast. Appears clean and the degree of callus around it is less than previously. 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 He had to take the stairs and both wounds have increased in size. The left foot, which has been in a total contact cast, has developed a tunnel tracking to the lateral aspect of his foot. The nonviable tissue in the center of the right-sided ulcer remains recalcitrant to debridement. There is significant undermining surrounding the entirety of the left sided wound. 06/16/2021: The elevator at school has been fixed and the patient has been able to avoid putting as much weight on his wounds over the past week. We converted the left foot wound into a single lesion today, but despite this, the wound is actually smaller. The base is healthy with limited periwound callus. On the right, the central necrotic area is still present. He continues to be quite macerated around the right sided wound, despite applying barrier cream. This does, however, have the benefit of softening the callus to make it more easily removable. 06/23/2021: Today, the left wound is smaller. The lateral aspect that had opened up previously is now closed. The wound base has a healthy bed of granulation tissue and  minimal slough. Unfortunately, on the right, the wound is larger and continues to be fairly macerated. He has also reopened the wound at his right ankle. He thinks this is due to the gait he has adopted secondary to his total contact cast and boot. 06/30/2021: T oday, both wounds are a little bit larger. The lateral aspect on the left has remained closed. He continues to have significant periwound maceration. The culture that I took from the right sided wound grew a population of bacteria that is not covered by his current [facility] antibiotic. The center of the right- sided wound continues to appear necrotic with nonviable fat. It probes deeper today, but does not reach bone. 07/07/2021: The periwound maceration is a little bit less today. The right lateral foot wound has some areas that appear more viable and the necrotic center also looks a little bit better. The wound on the dorsal surface of his right foot near the ankle is contracting and the surface appears healthy. The left plantar wound surface looks healthy, but there is some new undermining on the medial portion. He did get his new Keystone antibiotic and began applying that to the right foot wound on Saturday. 07/14/2021: The intake nurse reported substantial drainage from his wounds, but the periwound skin actually looks better than is typical for him. The wound on the dorsal surface of his right foot near the ankle is smaller and just has a small open area underneath some dried eschar. The left plantar wound surface looks healthy and there has been no significant accumulation of callus. The right  lateral foot wound looks quite a bit better, with the central portion, which typically appears necrotic, looking more viable albeit pale. 07/22/2021: His left foot is extremely macerated today. The wound is about the same size. The wound on the dorsal surface of his right foot near the ankle had closed, but he traumatized it removing the dressing and  there is a tiny skin tear in that location. The right lateral foot wound is bigger, but the surface appears healthy. 07/30/2021: The wound on the dorsal surface of his right foot near the ankle is closed. The right lateral foot wound again is a little bit bigger due to some undermining. The periwound skin is in better condition, however. He has been applying zinc oxide. The wound surface is a little bit dry today. On the left, he does not have the substantial maceration that we frequently see. The wound itself is smaller and has a clean surface. 08/06/2021: Both wounds seem to have deteriorated over the past week. The right lateral foot wound has a dry surface but the periwound is boggy.. Overall wound dimensions are about the same. On the left, the wound is about the same size, but there is more undermining present underneath periwound callus. 08/13/2021: The right sided wound looks about the same, but on the left there has been substantial deterioration. The undermining continues to extend under periwound callus. Once this was removed, substantial extension of the wound was present. There is no odor or purulent drainage but clearly the wounds have broken down. 08/20/2021: The wounds look about the same today. He has been out of his total contact cast and has just been changing the dressings using topical Keystone with PolyMem Ag, Kerlix and Ace bandages. The wound on the top of his right ankle has reopened but this is quite small. There was a little bit of purulent material that I expressed when examining this wound. Patient History Information obtained from Patient. Family History No family history of Cancer, Diabetes, Hereditary Spherocytosis, Hypertension, Kidney Disease, Lung Disease, Seizures, Stroke, Thyroid Problems, Tuberculosis. Social History Never smoker, Marital Status - Single, Alcohol Use - Rarely, Drug Use - No History, Caffeine Use - Never. Medical History Eyes Denies history of  Cataracts, Glaucoma, Optic Neuritis Ear/Nose/Mouth/Throat Denies history of Chronic sinus problems/congestion, Middle ear problems Hematologic/Lymphatic Denies history of Anemia, Hemophilia, Human Immunodeficiency Virus, Lymphedema, Sickle Cell Disease Respiratory Denies history of Aspiration, Asthma, Chronic Obstructive Pulmonary Disease (COPD), Pneumothorax, Sleep Apnea, Tuberculosis Cardiovascular Denies history of Angina, Arrhythmia, Congestive Heart Failure, Coronary Artery Disease, Deep Vein Thrombosis, Hypertension, Hypotension, Myocardial Infarction, Peripheral Arterial Disease, Peripheral Venous Disease, Phlebitis, Vasculitis Gastrointestinal Denies history of Cirrhosis , Colitis, Crohnoos, Hepatitis A, Hepatitis B, Hepatitis C Endocrine Patient has history of Type II Diabetes Denies history of Type I Diabetes Immunological Denies history of Lupus Erythematosus, Raynaudoos, Scleroderma Integumentary (Skin) Denies history of History of Burn Musculoskeletal Denies history of Gout, Rheumatoid Arthritis, Osteoarthritis, Osteomyelitis Neurologic Denies history of Dementia, Neuropathy, Quadriplegia, Paraplegia, Seizure Disorder Oncologic Denies history of Received Chemotherapy, Received Radiation Psychiatric Denies history of Anorexia/bulimia, Confinement Anxiety Hospitalization/Surgery History - 11/1-11/06/2018- sepsis foot infection. - 11/4-11/5 02 sats low respiratory distress. Objective Constitutional Slightly hypertensive. No acute distress. Vitals Time Taken: 2:27 PM, Height: 77 in, Weight: 280 lbs, BMI: 33.2, Temperature: 98.6 F, Pulse: 92 bpm, Respiratory Rate: 18 breaths/min, Blood Pressure: 144/80 mmHg, Capillary Blood Glucose: 120 mg/dl. Respiratory Normal work of breathing on room air. General Notes: 08/20/2021: His wounds look about the same this week. There  is some undermining from 9:00 to 12:00 on the left foot. He has senescent tissue heaped up around the  edges on the right. The wound on his ankle is very small with just some overlying eschar. Integumentary (Hair, Skin) Wound #11 status is Open. Original cause of wound was Gradually Appeared. The date acquired was: 08/20/2021. The wound is located on the [place]. The wound measures 0.2cm length x 0.2cm width x 0.1cm depth; 0.031cm^2 area and 0.003cm^3 volume. There is Fat Layer (Subcutaneous Tissue) exposed. There is no tunneling or undermining noted. There is a medium amount of purulent drainage noted. The wound margin is distinct with the outline attached to the wound base. There is large (67-100%) red granulation within the wound bed. There is no necrotic tissue within the wound bed. Wound #3 status is Open. Original cause of wound was Trauma. The date acquired was: 10/02/2019. The wound has been in treatment 95 weeks. The wound is located on the [place]. The wound measures 3.5cm length x 5.2cm width x 0.3cm depth; 14.294cm^2 area and 4.288cm^3 volume. There is Fat Layer (Subcutaneous Tissue) exposed. There is tunneling at 5:00 with a maximum distance of 1.1cm. There is a medium amount of serosanguineous drainage noted. The wound margin is well defined and not attached to the wound base. There is large (67-100%) red, pink granulation within the wound bed. There is a small (1-33%) amount of necrotic tissue within the wound bed including Adherent Slough. Wound #8 status is Open. Original cause of wound was Blister. The date acquired was: 04/18/2021. The wound has been in treatment 17 weeks. The wound is located on the Right,Lateral Foot. The wound measures 4cm length x 3.6cm width x 0.5cm depth; 11.31cm^2 area and 5.655cm^3 volume. There is muscle and Fat Layer (Subcutaneous Tissue) exposed. There is undermining starting at 3:00 and ending at 4:00 with a maximum distance of 0.2cm. There is a large amount of serosanguineous drainage noted. The wound margin is well defined and  not attached to the wound base. There is large (67-100%) red, pink granulation within the wound bed. There is a small (1-33%) amount of necrotic tissue within the wound bed including Adherent Slough. 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 There was a Selective/Open Wound Skin/Epidermis Debridement with a total area of 0.04 sq cm performed by [name], MD. With the following instrument(s): Curette to remove Viable and Non-Viable tissue/material. Material removed includes Eschar and Skin: Epidermis and. No specimens were taken. A time out was conducted at 02:51, prior to the start of the procedure. A Minimum amount of bleeding was controlled with Pressure. The procedure was tolerated well with a pain level of 0 throughout and a pain level of 0 following the procedure. Post Debridement Measurements: 0.2cm length x 0.2cm width x 0.1cm depth; 0.003cm^3 volume. Character of Wound/Ulcer Post Debridement is improved. Severity of Tissue Post Debridement is: Fat layer exposed. Post procedure Diagnosis Wound #11: Same as Pre-Procedure Wound #3 Pre-procedure diagnosis of Wound #3 is a Diabetic Wound/Ulcer of the Lower Extremity located on the Left,Lateral,Plantar Foot .Severity of Tissue Pre Debridement  is: Fat layer exposed. There was a Excisional Skin/Subcutaneous Tissue Debridement with a total area of 18.2 sq cm performed by [name], MD. With the following instrument(s): Curette to remove Viable and Non-Viable  tissue/material. Material removed includes Subcutaneous Tissue and Slough and. No specimens were taken. A time out was conducted at 02:51, prior to the start of the procedure. A Minimum amount of bleeding was controlled with Pressure. The procedure was tolerated well with a pain level of 0 throughout and a pain level of 0 following the procedure. Post Debridement Measurements: 3.5cm length x 5.2cm width x 0.3cm depth; 4.288cm^3 volume. Character of Wound/Ulcer Post Debridement is improved. Severity of Tissue Post Debridement is: Fat layer exposed. 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 There was a Excisional Skin/Subcutaneous Tissue Debridement with a total area of 14.4 sq cm performed by [name], MD. With the following instrument(s): Curette to remove Viable and Non-Viable tissue/material. Material removed includes Subcutaneous Tissue and Slough and. No specimens were taken. A time out was conducted at 02:51, prior to the start of the procedure. A Minimum amount of bleeding was controlled with Pressure. The procedure was tolerated well with a pain level of 0 throughout and a pain level of 0 following the procedure. Post Debridement Measurements: 4cm length x 3.6cm width x 0.5cm depth; 5.655cm^3 volume. Character of Wound/Ulcer Post Debridement is improved. Severity of Tissue Post Debridement is: Fat layer exposed. Post procedure Diagnosis Wound #8: Same as Pre-Procedure Plan Follow-up Appointments: Return Appointment in 1 week. - Dr. [name] - Room 2 - Bathing/ Shower/ Hygiene: May shower with protection but do not get wound dressing(s) wet. - Use Cast Protector Bag on Right and left legs Edema Control - Lymphedema / SCD / Other: Elevate legs to the level of the heart or above for 30  minutes daily and/or when sitting, a frequency of: - throughout the day Avoid standing for long periods of time. Exercise regularly Compression stocking or Garment 20-30 mm/Hg pressure to: - to right leg daily Off-Loading: Open toe surgical shoe to: - right foot Other: - Soft cast to left foot Additional Orders / Instructions: Follow Nutritious Diet WOUND #11: - Foot Wound Laterality: Right, Anterior Cleanser: Soap and Water 1 x Per [facility]/84 Days Discharge Instructions: May shower and wash wound with dial antibacterial soap and water prior to dressing change. Cleanser: Byram Ancillary Kit - 15 Day Supply (Generic) 1 x Per Day/15 Days Discharge Instructions: Use supplies as instructed; Kit contains: (15) Saline Bullets; (15) 3x3 Gauze; 15 pr Gloves Topical: keystone antibiotic compound 1 x Per Day/15 Days Prim Dressing: PolyMem Silver Non-Adhesive Dressing, 4.25x4.25 in (Generic) 1 x Per Day/15 Days ary Discharge Instructions: Apply to wound bed as instructed Secondary Dressing: ABD Pad, 8x10 (Generic) 1 x Per Day/15 Days Discharge Instructions: Apply over primary dressing as directed. Secondary Dressing: Optifoam Non-Adhesive Dressing, 4x4 in (Generic) 1 x Per Day/15 Days Discharge Instructions: Apply over primary dressing as directed. Secondary Dressing: Zetuvit Plus 4x8 in 1 x Per Day/15 Days Discharge Instructions: Apply over primary dressing as directed. Secondary Dressing: NonWoven Sponge, 4x4 (in/in) (Generic) 1 x Per Day/15 Days Secured With: Coban Self-Adherent Wrap 4x5 (in/yd) (Generic) 1 x Per Day/15 Days Discharge Instructions: Secure with Coban as directed. Secured With: [employer], 4.5x3.1 (in/yd) (Generic) 1 x Per Day/15 Days  Discharge Instructions: Secure with Kerlix as directed. Secured With: 59M Medipore H Soft Cloth Surgical T ape, 4 x 10 (in/yd) (Generic) 1 x Per Day/15 Days Discharge Instructions: Secure with tape as directed. WOUND #3: - Foot Wound  Laterality: Plantar, Left, Lateral Cleanser: Soap and Water 1 x Per Day/15 Days Discharge Instructions: May shower and wash wound with dial antibacterial soap and water prior to dressing change. Cleanser: Byram Ancillary Kit - 15 Day Supply (Generic) 1 x Per Day/15 Days Discharge Instructions: Use supplies as instructed; Kit contains: (15) Saline Bullets; (15) 3x3 Gauze; 15 pr Gloves Topical: keystone antibiotic compound 1 x Per Day/15 Days Prim Dressing: PolyMem Silver Non-Adhesive Dressing, 4.25x4.25 in (Generic) 1 x Per Day/15 Days ary Discharge Instructions: Apply to wound bed as instructed Secondary Dressing: ABD Pad, 8x10 (Generic) 1 x Per Day/15 Days Discharge Instructions: Apply over primary dressing as directed. Secondary Dressing: Optifoam Non-Adhesive Dressing, 4x4 in (Generic) 1 x Per Day/15 Days Discharge Instructions: Apply over primary dressing as directed. Secondary Dressing: Zetuvit Plus 4x8 in 1 x Per Day/15 Days Discharge Instructions: Apply over primary dressing as directed. Secondary Dressing: NonWoven Sponge, 4x4 (in/in) (Generic) 1 x Per Day/15 Days Secured With: Coban Self-Adherent Wrap 4x5 (in/yd) (Generic) 1 x Per Day/15 Days Discharge Instructions: Secure with Coban as directed. Secured With: [employer], 4.5x3.1 (in/yd) (Generic) 1 x Per Day/15 Days Discharge Instructions: Secure with Kerlix as directed. Secured With: 59M Medipore H Soft Cloth Surgical T ape, 4 x 10 (in/yd) (Generic) 1 x Per Day/15 Days Discharge Instructions: Secure with tape as directed. WOUND #8: - Foot Wound Laterality: Right, Lateral Cleanser: Soap and Water 1 x Per [facility]/42 Days Discharge Instructions: May shower and wash wound with dial antibacterial soap and water prior to dressing change. Cleanser: Byram Ancillary Kit - 15 Day Supply (Generic) 1 x Per Day/15 Days Discharge Instructions: Use supplies as instructed; Kit contains: (15) Saline Bullets; (15) 3x3 Gauze; 15 pr  Gloves Topical: keystone antibiotic compound 1 x Per Day/15 Days Prim Dressing: PolyMem Silver Non-Adhesive Dressing, 4.25x4.25 in (Generic) 1 x Per Day/15 Days ary Discharge Instructions: Apply to wound bed as instructed Secondary Dressing: ABD Pad, 8x10 (Generic) 1 x Per Day/15 Days Discharge Instructions: Apply over primary dressing as directed. Secondary Dressing: Optifoam Non-Adhesive Dressing, 4x4 in (Generic) 1 x Per Day/15 Days Discharge Instructions: Apply over primary dressing as directed. Secondary Dressing: Zetuvit Plus 4x8 in 1 x Per Day/15 Days Discharge Instructions: Apply over primary dressing as directed. Secondary Dressing: NonWoven Sponge, 4x4 (in/in) (Generic) 1 x Per Day/15 Days Secured With: Coban Self-Adherent Wrap 4x5 (in/yd) (Generic) 1 x Per Day/15 Days Discharge Instructions: Secure with Coban as directed. Secured With: [employer], 4.5x3.1 (in/yd) (Generic) 1 x Per Day/15 Days Discharge Instructions: Secure with Kerlix as directed. Secured With: 59M Medipore H Soft Cloth Surgical T ape, 4 x 10 (in/yd) (Generic) 1 x Per Day/15 Days Discharge Instructions: Secure with tape as directed. 08/20/2021: There has been no significant change to his wounds, but given the profound deterioration that occurred the week prior, this is a bit of a relief. He did reopen the wound on his dorsal ankle. I used a curette to debride senescent tissue on the right, eschar on the right dorsal ankle, and a portion of the callus that was creating undermining on the left. As there has been no real deterioration over the week, I think I am going to continue using the topical Keystone with PolyMem Ag, Kerlix, and Ace  wraps. Follow-up in 1 week. Electronic Signature(s) Signed: 08/20/2021 3:12:36 PM By: [name] MD FACS Entered By: [name] on 08/20/2021 15:12:35 -------------------------------------------------------------------------------- HxROS Details Patient Name:  Date of Service: [name], [name] 08/20/2021 2:15 PM Medical Record Number: [id number] Patient Account Number: [id number] Date of Birth/Sex: Treating RN: 02-07-1986 (36 y.o. [name] Primary Care Provider: [name] Other Clinician: Referring Provider: Treating Provider/Extender: [name] in Treatment: 95 Information Obtained From Patient Eyes Medical History: Negative for: Cataracts; Glaucoma; Optic Neuritis Ear/Nose/Mouth/Throat Medical History: Negative for: Chronic sinus problems/congestion; Middle ear problems Hematologic/Lymphatic Medical History: Negative for: Anemia; Hemophilia; Human Immunodeficiency Virus; Lymphedema; Sickle Cell Disease Respiratory Medical History: Negative for: Aspiration; Asthma; Chronic Obstructive Pulmonary Disease (COPD); Pneumothorax; Sleep Apnea; Tuberculosis Cardiovascular Medical History: Negative for: Angina; Arrhythmia; Congestive Heart Failure; Coronary Artery Disease; Deep Vein Thrombosis; Hypertension; Hypotension; Myocardial Infarction; Peripheral Arterial Disease; Peripheral Venous Disease; Phlebitis; Vasculitis Gastrointestinal Medical History: Negative for: Cirrhosis ; Colitis; Crohns; Hepatitis A; Hepatitis B; Hepatitis C Endocrine Medical History: Positive for: Type II Diabetes Negative for: Type I Diabetes Time with diabetes: 8 Treated with: Insulin Blood sugar tested every day: No Immunological Medical History: Negative for: Lupus Erythematosus; Raynauds; Scleroderma Integumentary (Skin) Medical History: Negative for: History of Burn Musculoskeletal Medical History: Negative for: Gout; Rheumatoid Arthritis; Osteoarthritis; Osteomyelitis Neurologic Medical History: Negative for: Dementia; Neuropathy; Quadriplegia; Paraplegia; Seizure Disorder Oncologic Medical History: Negative for: Received Chemotherapy; Received Radiation Psychiatric Medical History: Negative for:  Anorexia/bulimia; Confinement Anxiety Immunizations Pneumococcal Vaccine: Received Pneumococcal Vaccination: No Implantable Devices None Hospitalization / Surgery History Type of Hospitalization/Surgery 11/1-11/06/2018- sepsis foot infection 11/4-11/5 02 sats low respiratory distress Family and Social History Cancer: No; Diabetes: No; Hereditary Spherocytosis: No; Hypertension: No; Kidney Disease: No; Lung Disease: No; Seizures: No; Stroke: No; Thyroid Problems: No; Tuberculosis: No; Never smoker; Marital Status - Single; Alcohol Use: Rarely; Drug Use: No History; Caffeine Use: Never; Financial Concerns: No; Food, Clothing or Shelter Needs: No; Support System Lacking: No; Transportation Concerns: No Electronic Signature(s) Signed: 08/23/2021 7:39:29 AM By: [name] MD FACS Signed: 08/23/2021 5:26:25 PM By: [name] RN, BSN Entered By: [name] on 08/20/2021 15:06:34 -------------------------------------------------------------------------------- [facility] Details Patient Name: Date of Service: [name], [name] 08/20/2021 Medical Record Number: [id number] Patient Account Number: [id number] Date of Birth/Sex: Treating RN: [birth year] (36 y.o. 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 Facility Procedures CPT4 Code: 27035009 Description: 38182 - DEB SUBQ TISSUE 20 SQ CM/< ICD-10 Diagnosis Description L97.518 Non-pressure chronic ulcer of other part of right foot with other  specified sever Modifier: ity Quantity: 1 CPT4 Code: 99371696 Description: 11045 - DEB SUBQ TISS EA ADDL 20CM ICD-10 Diagnosis Description L97.528 Non-pressure chronic ulcer of other part of left foot with other specified severi Modifier: ty Quantity: 1 CPT4 Code: 78938101 Description: 97597 - DEBRIDE WOUND 1ST 20 SQ CM OR < ICD-10 Diagnosis Description 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 Modifier: Quantity: 1 Physician Procedures : CPT4 Code Description Modifier [id number] 52778 - WC PHYS LEVEL 3 - EST PT 25 ICD-10 Diagnosis Description E11.621 Type  2 diabetes mellitus with foot ulcer L97.528 Non-pressure chronic ulcer of other part of left foot with other specified severity L97.518  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 Quantity: 1 : [id number] 11042 - WC PHYS SUBQ TISS 20 SQ CM ICD-10 Diagnosis Description L97.518 Non-pressure chronic ulcer of other part of right foot with other specified severity Quantity: 1 : [id number] 73736 - WC PHYS SUBQ TISS EA ADDL 20 CM ICD-10 Diagnosis Description L97.528 Non-pressure chronic ulcer of other part of left foot with other specified severity Quantity: 1 : [id number] 07615 - WC PHYS DEBR WO ANESTH 20 SQ CM ICD-10 Diagnosis Description 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

## 2021-08-24 ENCOUNTER — Encounter (HOSPITAL_BASED_OUTPATIENT_CLINIC_OR_DEPARTMENT_OTHER): Payer: BC Managed Care – PPO | Admitting: General Surgery

## 2021-08-24 DIAGNOSIS — E11621 Type 2 diabetes mellitus with foot ulcer: Secondary | ICD-10-CM | POA: Diagnosis not present

## 2021-08-25 NOTE — Progress Notes (Signed)
[name] ([id number]) Visit Report for 08/24/2021 Arrival Information Details Patient Name: Date of Service: [name] 08/24/2021 3:15 PM Medical Record Number: [id number] Patient Account Number: [id number] Date of Birth/Sex: Treating RN: [birth year] (36 y.o. [name] Primary Care Markanthony Gedney: [name] Other Clinician: Referring Dustin Burrill: Treating Kourtnei Rauber/Extender: [name] in Treatment: 95 Visit Information History Since Last Visit Added or deleted any medications: No Patient Arrived: Ambulatory Any new allergies or adverse reactions: No Arrival Time: 15:49 Had a fall or experienced change in No Accompanied By: self activities of daily living that may affect Transfer Assistance: None risk of falls: Patient Identification Verified: Yes Signs or symptoms of abuse/neglect since last visito No Secondary Verification Process Completed: Yes Hospitalized since last visit: No Patient Requires Transmission-Based Precautions: No Implantable device outside of the clinic excluding No Patient Has Alerts: No cellular tissue based products placed in the center since last visit: Has Dressing in Place as Prescribed: Yes Pain Present Now: No Electronic Signature(s) Signed: 08/25/2021 3:12:02 PM By: Sandre Kitty Entered By: Sandre Kitty on 08/24/2021 15:50:33 -------------------------------------------------------------------------------- Encounter Discharge Information Details Patient Name: Date of Service: [name], [name] 08/24/2021 3:15 PM Medical Record Number: [id number] Patient Account Number: [id number] Date of Birth/Sex: Treating RN: [birth year] (36 y.o. [name] Primary Care Camron Monday: [name] Other Clinician: Referring [name]: Treating Finnlee Silvernail/Extender: [name] in Treatment: 95 Encounter Discharge Information Items Post Procedure Vitals Discharge Condition:  Stable Temperature (F): 98.5 Ambulatory Status: Ambulatory Pulse (bpm): 90 Discharge Destination: Home Respiratory Rate (breaths/min): 18 Transportation: Private Auto Blood Pressure (mmHg): 136/83 Accompanied By: alone Schedule Follow-up Appointment: Yes Clinical Summary of Care: Patient Declined Electronic Signature(s) Signed: 08/25/2021 5:57:27 PM By: [name] RN, BSN Entered By: [name] on 08/24/2021 17:48:54 -------------------------------------------------------------------------------- Lower Extremity Assessment Details Patient Name: Date of Service: [name], [name] 08/24/2021 3:15 PM Medical Record Number: [id number] Patient Account Number: [id number] Date of Birth/Sex: Treating RN: [name] (36 y.o. [name] Primary Care Haik Mahoney: [name] Other Clinician: Referring Shavawn Stobaugh: Treating Kaysha Parsell/Extender: [name] in Treatment: 95 Edema Assessment Assessed: [name]: No] [name]: No] Edema: [Left: Yes] [Right: Yes] Calf Left: Right: Point of Measurement: 48 cm From Medial Instep 49 cm 49 cm Ankle Left: Right: Point of Measurement: 11 cm From Medial Instep 27 cm 30 cm Vascular Assessment Pulses: Dorsalis Pedis Palpable: [Left:Yes] [Right:Yes] Electronic Signature(s) Signed: 08/25/2021 5:57:27 PM By: [name] RN, BSN Entered By: [name] on 08/24/2021 16:42:10 -------------------------------------------------------------------------------- Multi Wound Chart Details Patient Name: Date of Service: [name], [name] 08/24/2021 3:15 PM Medical Record Number: [id number] Patient Account Number: [id number] Date of Birth/Sex: Treating RN: [name] (36 y.o. [name] Primary Care Ronisha Herringshaw: [name] Other Clinician: Referring [name]: Treating Lilliahna Schubring/Extender: [name] in Treatment: 95 Vital Signs Height(in): 77 Capillary Blood Glucose(mg/dl):  105 Weight(lbs): 280 Pulse(bpm): 90 Body Mass Index(BMI): 33.2 Blood Pressure(mmHg): 136/83 Temperature(F): 98.5 Respiratory Rate(breaths/min): 18 Photos: [11:Right, Anterior Foot] [3:Left, Lateral, Plantar Foot] [8:Right, Lateral Foot] Wound Location: [11:Gradually Appeared] [3:Trauma] [8:Blister] Wounding Event: [11:Diabetic Wound/Ulcer of the Lower] [3:Diabetic Wound/Ulcer of the Lower] [8:Diabetic Wound/Ulcer of the Lower] Primary Etiology: [11:Extremity Type II Diabetes] [3:Extremity Type II Diabetes] [8:Extremity Type II Diabetes] Comorbid History: [11:08/20/2021] [3:10/02/2019] [8:04/18/2021] Date Acquired: [11:0] [3:95] [8:17] Weeks of Treatment: [11:Open] [3:Open] [8:Open] Wound Status: [11:No] [3:No] [8:No] Wound Recurrence: [11:0.7x1.6x0.1] [3:3.5x4.8x0.5] [8:3.6x3.7x0.8] Measurements L x W x D (cm) [11:0.88] [3:13.195] [8:10.462] A (cm) :  rea [11:0.088] [3:6.597] [8:8.369] Volume (cm) : [11:-2738.70%] [3:-700.20%] [8:-1802.20%] % Reduction in A [11:rea: -2833.30%] [3:-3898.20%] [8:-7508.20%] % Reduction in Volume: [11:Grade 2] [3:Grade 2] [8:Grade 2] Classification: [11:Medium] [3:Medium] [8:Large] Exudate A mount: [11:Purulent] [3:Serosanguineous] [8:Serosanguineous] Exudate Type: [11:yellow, brown, green] [3:red, brown] [8:red, brown] Exudate Color: [11:Distinct, outline attached] [3:Well defined, not attached] [8:Well defined, not attached] Wound Margin: [11:Large (67-100%)] [3:Large (67-100%)] [8:Large (67-100%)] Granulation A mount: [11:Red] [3:Red, Pink] [8:Red, Pink] Granulation Quality: [11:None Present (0%)] [3:Small (1-33%)] [8:Small (1-33%)] Necrotic A mount: [11:Fat Layer (Subcutaneous Tissue): Yes Fat Layer (Subcutaneous Tissue): Yes Fat Layer (Subcutaneous Tissue): Yes] Exposed Structures: [11:Fascia: No Tendon: No Muscle: No Joint: No Bone: No Small (1-33%)] [3:Fascia: No Tendon: No Muscle: No Joint: No Bone: No Small (1-33%)] [8:Fascia: No Tendon: No  Muscle: No Joint: No Bone: No Small (1-33%)] Epithelialization: [11:Debridement - Selective/Open Wound Debridement - Selective/Open Wound Debridement - Excisional] Debridement: Pre-procedure Verification/Time Out 16:40 [3:16:40] [8:16:40] Taken: [11:Necrotic/Eschar, Slough] [3:Callus] [8:Fat, Callus, Slough] Tissue Debrided: [11:Non-Viable Tissue] [3:Non-Viable Tissue] [8:Skin/Subcutaneous Tissue] Level: [11:1.12] [3:1] [8:13.32] Debridement A (sq cm): [11:rea Curette] [3:Curette] [8:Curette] Instrument: [11:Minimum] [3:Minimum] [8:Moderate] Bleeding: [11:Pressure] [3:Pressure] [8:Silver Nitrate] Hemostasis A chieved: [11:0] [3:0] [8:0] Procedural Pain: [11:0] [3:0] [8:0] Post Procedural Pain: [11:Procedure was tolerated well] [3:Procedure was tolerated well] [8:Procedure was tolerated well] Debridement Treatment Response: [11:0.7x1.6x0.1] [3:3.5x4.8x0.5] [8:3.6x3.7x0.8] Post Debridement Measurements L x W x D (cm) [11:0.088] [3:6.597] [8:8.369] Post Debridement Volume: (cm) [11:Debridement] [3:Debridement] [8:Debridement] Treatment Notes Electronic Signature(s) Signed: 08/24/2021 5:19:30 PM By: [name] MD FACS Signed: 08/24/2021 5:44:44 PM By: Sabas Sous By: [name] on 08/24/2021 17:19:30 -------------------------------------------------------------------------------- Multi-Disciplinary Care Plan Details Patient Name: Date of Service: [name], [name] 08/24/2021 3:15 PM Medical Record Number: [id number] Patient Account Number: [id number] Date of Birth/Sex: Treating RN: [name] (36 y.o. [name] Primary Care Consuela Widener: [name] Other Clinician: Referring Merrill Villarruel: Treating Merline Perkin/Extender: [name] in Treatment: [place] reviewed with physician Active Inactive Nutrition Nursing Diagnoses: Imbalanced nutrition Potential for alteratiion in Nutrition/Potential for imbalanced  nutrition Goals: Patient/caregiver agrees to and verbalizes understanding of need to use nutritional supplements and/or vitamins as prescribed Date Initiated: 10/24/2019 Date Inactivated: 04/06/2020 Target Resolution Date: 04/03/2020 Goal Status: Met Patient/caregiver will maintain therapeutic glucose control Date Initiated: 10/24/2019 Target Resolution Date: 09/03/2021 Goal Status: Active Interventions: Assess HgA1c results as ordered upon admission and as needed Assess patient nutrition upon admission and as needed per policy Provide education on elevated blood sugars and impact on wound healing Provide education on nutrition Treatment Activities: Education provided on Nutrition : 12/02/2020 Notes: 11/17/20: Glucose control ongoing issue, target date extended. 01/26/21: Glucose management continues. Wound/Skin Impairment Nursing Diagnoses: Impaired tissue integrity Knowledge deficit related to ulceration/compromised skin integrity Goals: Patient/caregiver will verbalize understanding of skin care regimen Date Initiated: 10/24/2019 Target Resolution Date: 09/03/2021 Goal Status: Active Ulcer/skin breakdown will have a volume reduction of 30% by week 4 Date Initiated: 10/24/2019 Date Inactivated: 01/16/2020 Target Resolution Date: 01/10/2020 Unmet Reason: no change in Goal Status: Unmet measurements. Interventions: Assess patient/caregiver ability to obtain necessary supplies Assess patient/caregiver ability to perform ulcer/skin care regimen upon admission and as needed Assess ulceration(s) every visit Provide education on ulcer and skin care Notes: 11/17/20: Wound care regimen continues Electronic Signature(s) Signed: 08/25/2021 5:57:27 PM By: [name] RN, BSN Entered By: [name] on 08/24/2021 17:47:02 -------------------------------------------------------------------------------- Pain Assessment Details Patient Name: Date of Service: [name], [name] 08/24/2021  3:15 PM Medical Record Number: [id number] Patient Account  Number: [id number] Date of Birth/Sex: Treating RN: [birth year] (36 y.o. [name] Primary Care Pearson Reasons: [name] Other Clinician: Referring Tyquisha Sharps: Treating Ameer Sanden/Extender: [name] in Treatment: 95 Active Problems Location of Pain Severity and Description of Pain Patient Has Paino No Site Locations Pain Management and Medication Current Pain Management: Electronic Signature(s) Signed: 08/24/2021 5:44:44 PM By: Adline Peals Signed: 08/25/2021 3:12:02 PM By: Sandre Kitty Entered By: Sandre Kitty on 08/24/2021 15:51:38 -------------------------------------------------------------------------------- Patient/Caregiver Education Details Patient Name: Date of Service: [name] 5/23/2023andnbsp3:15 PM Medical Record Number: [id number] Patient Account Number: [id number] Date of Birth/Gender: Treating RN: [birth year] (36 y.o. [name] Primary Care Physician: [name] Other Clinician: Referring Physician: Treating Physician/Extender: [name] in Treatment: 95 Education Assessment Education Provided To: Patient Education Topics Provided Wound/Skin Impairment: Methods: Explain/Verbal Responses: State content correctly [employer]) Signed: 08/25/2021 5:57:27 PM By: [name] RN, BSN Entered By: [name] on 08/24/2021 17:47:17 -------------------------------------------------------------------------------- Wound Assessment Details Patient Name: Date of Service: [name], [name] 08/24/2021 3:15 PM Medical Record Number: [id number] Patient Account Number: [id number] Date of Birth/Sex: Treating RN: [name] (36 y.o. [name] Primary Care Micha Dosanjh: [name] Other Clinician: Referring Kristell Wooding: Treating Jaeger Trueheart/Extender: [name] in  Treatment: 95 Wound Status Wound Number: 11 Primary Etiology: Diabetic Wound/Ulcer of the Lower Extremity Wound Location: Right, Anterior Foot Wound Status: Open Wounding Event: Gradually Appeared Comorbid History: Type II Diabetes Date Acquired: 08/20/2021 Weeks Of Treatment: 0 Clustered Wound: No Photos Wound Measurements Length: (cm) 0.7 Width: (cm) 1.6 Depth: (cm) 0.1 Area: (cm) 0.88 Volume: (cm) 0.088 % Reduction in Area: -2738.7% % Reduction in Volume: -2833.3% Epithelialization: Small (1-33%) Wound Description Classification: Grade 2 Wound Margin: Distinct, outline attached Exudate Amount: Medium Exudate Type: Purulent Exudate Color: yellow, brown, green Foul Odor After Cleansing: No Slough/Fibrino No Wound Bed Granulation Amount: Large (67-100%) Exposed Structure Granulation Quality: Red Fascia Exposed: No Necrotic Amount: None Present (0%) Fat Layer (Subcutaneous Tissue) Exposed: Yes Tendon Exposed: No Muscle Exposed: No Joint Exposed: No Bone Exposed: No Treatment Notes Wound #11 (Foot) Wound Laterality: Right, Anterior Cleanser Soap and Water Discharge Instruction: May shower and wash wound with dial antibacterial soap and water prior to dressing change. Byram Ancillary Kit - 15 Day Supply Discharge Instruction: Use supplies as instructed; Kit contains: (15) Saline Bullets; (15) 3x3 Gauze; 15 pr Gloves Peri-Wound Care Topical keystone antibiotic compound Primary Dressing PolyMem Silver Non-Adhesive Dressing, 4.25x4.25 in Discharge Instruction: Apply to wound bed as instructed Secondary Dressing ABD Pad, 8x10 Discharge Instruction: Apply over primary dressing as directed. Optifoam Non-Adhesive Dressing, 4x4 in Discharge Instruction: Apply over primary dressing as directed. Zetuvit Plus 4x8 in Discharge Instruction: Apply over primary dressing as directed. NonWoven Sponge, 4x4 (in/in) Secured With Coban Self-Adherent Wrap 4x5 (in/yd) Discharge  Instruction: Secure with Coban as directed. Kerlix Roll Sterile, 4.5x3.1 (in/yd) Discharge Instruction: Secure with Kerlix as directed. 26M Medipore H Soft Cloth Surgical T ape, 4 x 10 (in/yd) Discharge Instruction: Secure with tape as directed. Compression Wrap Compression Stockings Add-Ons Electronic Signature(s) Signed: 08/24/2021 5:44:44 PM By: Adline Peals Signed: 08/25/2021 3:12:02 PM By: Sandre Kitty Entered By: Sandre Kitty on 08/24/2021 15:47:35 -------------------------------------------------------------------------------- Wound Assessment Details Patient Name: Date of Service: [name], [name] 08/24/2021 3:15 PM Medical Record Number: [id number] Patient Account Number: [id number] Date of Birth/Sex: Treating RN: [name] (36 y.o. [name] Primary Care Ryane Konieczny: [name] Other Clinician: Referring Elijan Googe: Treating Weltha Cathy/Extender: [name]  [name], Lowell Bouton in Treatment: 95 Wound Status Wound Number: 3 Primary Etiology: Diabetic Wound/Ulcer of the Lower Extremity Wound Location: Left, Lateral, Plantar Foot Wound Status: Open Wounding Event: Trauma Comorbid History: Type II Diabetes Date Acquired: 10/02/2019 Weeks Of Treatment: 95 Clustered Wound: No Photos Wound Measurements Length: (cm) 3.5 Width: (cm) 4.8 Depth: (cm) 0.5 Area: (cm) 13.195 Volume: (cm) 6.597 % Reduction in Area: -700.2% % Reduction in Volume: -3898.2% Epithelialization: Small (1-33%) Wound Description Classification: Grade 2 Wound Margin: Well defined, not attached Exudate Amount: Medium Exudate Type: Serosanguineous Exudate Color: red, brown Wound Bed Granulation Amount: Large (67-100%) Granulation Quality: Red, Pink Necrotic Amount: Small (1-33%) Necrotic Quality: Adherent Slough Foul Odor After Cleansing: No Slough/Fibrino Yes Exposed Structure Fascia Exposed: No Fat Layer (Subcutaneous Tissue) Exposed: Yes Tendon Exposed:  No Muscle Exposed: No Joint Exposed: No Bone Exposed: No Treatment Notes Wound #3 (Foot) Wound Laterality: Plantar, Left, Lateral Cleanser Soap and Water Discharge Instruction: May shower and wash wound with dial antibacterial soap and water prior to dressing change. Byram Ancillary Kit - 15 Day Supply Discharge Instruction: Use supplies as instructed; Kit contains: (15) Saline Bullets; (15) 3x3 Gauze; 15 pr Gloves Peri-Wound Care Topical keystone antibiotic compound Primary Dressing PolyMem Silver Non-Adhesive Dressing, 4.25x4.25 in Discharge Instruction: Apply to wound bed as instructed Secondary Dressing ABD Pad, 8x10 Discharge Instruction: Apply over primary dressing as directed. Optifoam Non-Adhesive Dressing, 4x4 in Discharge Instruction: Apply over primary dressing as directed. Zetuvit Plus 4x8 in Discharge Instruction: Apply over primary dressing as directed. NonWoven Sponge, 4x4 (in/in) Secured With Coban Self-Adherent Wrap 4x5 (in/yd) Discharge Instruction: Secure with Coban as directed. Kerlix Roll Sterile, 4.5x3.1 (in/yd) Discharge Instruction: Secure with Kerlix as directed. 64M Medipore H Soft Cloth Surgical T ape, 4 x 10 (in/yd) Discharge Instruction: Secure with tape as directed. Compression Wrap Compression Stockings Add-Ons Electronic Signature(s) Signed: 08/24/2021 5:44:44 PM By: Adline Peals Signed: 08/25/2021 3:12:02 PM By: Sandre Kitty Entered By: Sandre Kitty on 08/24/2021 15:48:19 -------------------------------------------------------------------------------- Wound Assessment Details Patient Name: Date of Service: [name], [name] 08/24/2021 3:15 PM Medical Record Number: [id number] Patient Account Number: [id number] Date of Birth/Sex: Treating RN: [name] (36 y.o. [name] Primary Care Melena Hayes: [name] Other Clinician: Referring Jakoby Melendrez: Treating Ieshia Hatcher/Extender: [name]  in Treatment: 95 Wound Status Wound Number: 8 Primary Etiology: Diabetic Wound/Ulcer of the Lower Extremity Wound Location: Right, Lateral Foot Wound Status: Open Wounding Event: Blister Comorbid History: Type II Diabetes Date Acquired: 04/18/2021 Weeks Of Treatment: 17 Clustered Wound: No Photos Wound Measurements Length: (cm) 3.6 Width: (cm) 3.7 Depth: (cm) 0.8 Area: (cm) 10.462 Volume: (cm) 8.369 % Reduction in Area: -1802.2% % Reduction in Volume: -7508.2% Epithelialization: Small (1-33%) Wound Description Classification: Grade 2 Wound Margin: Well defined, not attached Exudate Amount: Large Exudate Type: Serosanguineous Exudate Color: red, brown Foul Odor After Cleansing: No Slough/Fibrino Yes Wound Bed Granulation Amount: Large (67-100%) Exposed Structure Granulation Quality: Red, Pink Fascia Exposed: No Necrotic Amount: Small (1-33%) Fat Layer (Subcutaneous Tissue) Exposed: Yes Necrotic Quality: Adherent Slough Tendon Exposed: No Muscle Exposed: No Joint Exposed: No Bone Exposed: No Treatment Notes Wound #8 (Foot) Wound Laterality: Right, Lateral Cleanser Soap and Water Discharge Instruction: May shower and wash wound with dial antibacterial soap and water prior to dressing change. Byram Ancillary Kit - 15 Day Supply Discharge Instruction: Use supplies as instructed; Kit contains: (15) Saline Bullets; (15) 3x3 Gauze; 15 pr Gloves Peri-Wound Care Topical keystone antibiotic compound Primary Dressing PolyMem Silver Non-Adhesive Dressing,  4.25x4.25 in Discharge Instruction: Apply to wound bed as instructed Secondary Dressing ABD Pad, 8x10 Discharge Instruction: Apply over primary dressing as directed. Optifoam Non-Adhesive Dressing, 4x4 in Discharge Instruction: Apply over primary dressing as directed. Zetuvit Plus 4x8 in Discharge Instruction: Apply over primary dressing as directed. NonWoven Sponge, 4x4 (in/in) Secured With Coban Self-Adherent Wrap  4x5 (in/yd) Discharge Instruction: Secure with Coban as directed. Kerlix Roll Sterile, 4.5x3.1 (in/yd) Discharge Instruction: Secure with Kerlix as directed. 33M Medipore H Soft Cloth Surgical T ape, 4 x 10 (in/yd) Discharge Instruction: Secure with tape as directed. Compression Wrap Compression Stockings Add-Ons Electronic Signature(s) Signed: 08/24/2021 5:44:44 PM By: Adline Peals Signed: 08/25/2021 3:12:02 PM By: Sandre Kitty Entered By: Sandre Kitty on 08/24/2021 15:49:04 -------------------------------------------------------------------------------- Vitals Details Patient Name: Date of Service: [name], [name] 08/24/2021 3:15 PM Medical Record Number: [id number] Patient Account Number: [id number] Date of Birth/Sex: Treating RN: [name] (36 y.o. [name] Primary Care Garnetta Fedrick: [name] Other Clinician: Referring Lesly Joslyn: Treating Keeva Reisen/Extender: [name] in Treatment: 95 Vital Signs Time Taken: 15:50 Temperature (F): 98.5 Height (in): 77 Pulse (bpm): 90 Weight (lbs): 280 Respiratory Rate (breaths/min): 18 Body Mass Index (BMI): 33.2 Blood Pressure (mmHg): 136/83 Capillary Blood Glucose (mg/dl): 105 Reference Range: 80 - 120 mg / dl Electronic Signature(s) Signed: 08/25/2021 3:12:02 PM By: Sandre Kitty Entered By: Sandre Kitty on 08/24/2021 15:51:14

## 2021-08-25 NOTE — Progress Notes (Signed)
[name] ([id number]) Visit Report for 08/24/2021 Chief Complaint Document Details Patient Name: Date of Service: [name] 08/24/2021 3:15 PM Medical Record Number: [id number] Patient Account Number: [id number] Date of Birth/Sex: Treating RN: Jan 22, 1986 (36 y.o. [name] Primary Care Provider: [name] Other Clinician: Referring Provider: Treating Provider/Extender: [name] in Treatment: 95 Information Obtained from: Patient Chief Complaint 01/11/2019; patient is here for review of a rather substantial wound over the left fifth plantar metatarsal head extending into the lateral part of his foot 10/24/2019; patient returns to clinic with wounds on his bilateral feet with underlying osteomyelitis biopsy-proven Electronic Signature(s) Signed: 08/24/2021 5:19:38 PM By: [name] MD FACS Entered By: [name] on 08/24/2021 17:19:38 -------------------------------------------------------------------------------- Debridement Details Patient Name: Date of Service: [name], [name] 08/24/2021 3:15 PM Medical Record Number: [id number] Patient Account Number: [id number] Date of Birth/Sex: Treating RN: 02-Oct-1985 (36 y.o. [name] Primary Care Provider: [name] Other Clinician: Referring Provider: Treating Provider/Extender: [name] in Treatment: 95 Debridement Performed for Assessment: Wound #3 Left,Lateral,Plantar Foot Performed By: Physician [name], MD Debridement Type: Debridement Severity of Tissue Pre Debridement: Fat layer exposed Level of Consciousness (Pre-procedure): Awake and Alert Pre-procedure Verification/Time Out Yes - 16:40 Taken: Start Time: 16:40 T Area Debrided (L x W): otal 1 (cm) x 1 (cm) = 1 (cm) Tissue and other material debrided: Non-Viable, Callus Level: Non-Viable Tissue Debridement Description: Selective/Open Wound Instrument:  Curette Bleeding: Minimum Hemostasis Achieved: Pressure End Time: 16:41 Procedural Pain: 0 Post Procedural Pain: 0 Response to Treatment: Procedure was tolerated well Level of Consciousness (Post- Awake and Alert procedure): Post Debridement Measurements of Total Wound Length: (cm) 3.5 Width: (cm) 4.8 Depth: (cm) 0.5 Volume: (cm) 6.597 Character of Wound/Ulcer Post Debridement: Improved Severity of Tissue Post Debridement: Fat layer exposed Post Procedure Diagnosis Same as Pre-procedure Electronic Signature(s) Signed: 08/24/2021 5:26:11 PM By: [name] MD FACS Signed: 08/25/2021 5:57:27 PM By: [name] RN, BSN Entered By: [name] on 08/24/2021 16:43:25 -------------------------------------------------------------------------------- Debridement Details Patient Name: Date of Service: [name], [name] 08/24/2021 3:15 PM Medical Record Number: [id number] Patient Account Number: [id number] Date of Birth/Sex: Treating RN: February 01, 1986 (36 y.o. [name] Primary Care Provider: [name] Other Clinician: Referring Provider: Treating Provider/Extender: [name] in Treatment: 95 Debridement Performed for Assessment: Wound #11 [place] Performed By: Physician [name], MD Debridement Type: Debridement Severity of Tissue Pre Debridement: Fat layer exposed Level of Consciousness (Pre-procedure): Awake and Alert Pre-procedure Verification/Time Out Yes - 16:40 Taken: Start Time: 16:40 T Area Debrided (L x W): otal 0.7 (cm) x 1.6 (cm) = 1.12 (cm) Tissue and other material debrided: Non-Viable, Eschar, Slough, Slough Level: Non-Viable Tissue Debridement Description: Selective/Open Wound Instrument: Curette Bleeding: Minimum Hemostasis Achieved: Pressure End Time: 16:41 Procedural Pain: 0 Post Procedural Pain: 0 Response to Treatment: Procedure was tolerated well Level of Consciousness (Post- Awake  and Alert procedure): Post Debridement Measurements of Total Wound Length: (cm) 0.7 Width: (cm) 1.6 Depth: (cm) 0.1 Volume: (cm) 0.088 Character of Wound/Ulcer Post Debridement: Improved Severity of Tissue Post Debridement: Fat layer exposed Post Procedure Diagnosis Same as Pre-procedure Electronic Signature(s) Signed: 08/24/2021 5:26:11 PM By: [name] MD FACS Signed: 08/25/2021 5:57:27 PM By: [name] RN, BSN Entered By: [name] on 08/24/2021 16:44:11 -------------------------------------------------------------------------------- Debridement Details Patient Name: Date of Service: [name], [name] 08/24/2021 3:15 PM Medical Record Number: [id number] Patient Account Number: [id number] Date  of Birth/Sex: Treating RN: [birth year] (36 y.o. [name] Primary Care Provider: [name] Other Clinician: Referring Provider: Treating Provider/Extender: [name] in Treatment: 95 Debridement Performed for Assessment: Wound #8 Right,Lateral Foot Performed By: Physician [name], MD Debridement Type: Debridement Severity of Tissue Pre Debridement: Necrosis of muscle Level of Consciousness (Pre-procedure): Awake and Alert Pre-procedure Verification/Time Out Yes - 16:40 Taken: Start Time: 16:43 T Area Debrided (L x W): otal 3.6 (cm) x 3.7 (cm) = 13.32 (cm) Tissue and other material debrided: Non-Viable, Callus, Fat, Slough, Slough Level: Skin/Subcutaneous Tissue Debridement Description: Excisional Instrument: Curette Bleeding: Moderate Hemostasis Achieved: Silver Nitrate End Time: 16:45 Procedural Pain: 0 Post Procedural Pain: 0 Response to Treatment: Procedure was tolerated well Level of Consciousness (Post- Awake and Alert procedure): Post Debridement Measurements of Total Wound Length: (cm) 3.6 Width: (cm) 3.7 Depth: (cm) 0.8 Volume: (cm) 8.369 Character of Wound/Ulcer Post Debridement: Improved Severity  of Tissue Post Debridement: Fat layer exposed Post Procedure Diagnosis Same as Pre-procedure Electronic Signature(s) Signed: 08/24/2021 5:26:11 PM By: [name] MD FACS Signed: 08/25/2021 5:57:27 PM By: [name] RN, BSN Entered By: [name] on 08/24/2021 16:45:48 -------------------------------------------------------------------------------- HPI Details Patient Name: Date of Service: [name], [name] 08/24/2021 3:15 PM Medical Record Number: [id number] Patient Account Number: [id number] Date of Birth/Sex: Treating RN: [birth year] (36 y.o. 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 He comes in for review of a wound over the plantar fifth metatarsal head extending into the lateral part of the foot. He was followed for this previously by his podiatrist Dr. Cornelius Moras. As the patient tells his story he went to see podiatry first for a swelling he developed on the lateral part of his fifth metatarsal head in May. He states this was "open" by podiatry and the area closed. He was followed up in June and it was again opened callus removed and it closed promptly. There were plans being made for surgery on the fifth metatarsal head in June however his blood sugar was apparently too high for anesthesia. Apparently the area was debrided and opened again in June and it is never closed since. Looking over the records from podiatry I am really not able to follow this. It was clear when he was first seen it was before 5/14 at that point he already had a wound. By 5/17 the ulcer was resolved. I do not see anything about a procedure. On 5/28 noted to have pre-ulcerative moderate keratosis. X-ray noted  1/5 contracted toe and tailor's bunion and metatarsal deformity. On a visit date on 09/28/2018 the dorsal part of the left foot it healed and resolved. There was concern about swelling in his lower extremity he was sent to the ER.. As far as I can tell he was seen in the ER on 7/12 with an ulcer on his left foot. A DVT rule out of the left leg was negative. I do not think I have complete records from podiatry but I am not able to verify the procedures this patient states he had. He states after the last procedure the wound has never closed although I am not able to follow this in the records I have from podiatry. He has not had a recent x-ray The patient has been using  Neosporin on the wound. He is wearing a Darco shoe. He is still very active up on his foot working and exercising. Past medical history; type 2 diabetes ketosis-prone, leg swelling with a negative DVT study in July. Non-smoker ABI in our clinic was 0.85 on the left 10/16; substantial wound on the plantar left fifth met head extending laterally almost to the dorsal fifth MTP. We have been using silver alginate we gave him a Darco forefoot off loader. An x-ray did not show evidence of osteomyelitis did note soft tissue emphysema which I think was due to gas tracking through an open wound. There is no doubt in my mind he requires an MRI 10/23; MRI not booked until 3 November at the earliest this is largely due to his glucose sensor in the right arm. We have been using silver alginate. There has been an improvement 10/29; I am still not exactly sure when his MRI is booked for. He says it is the third but it is the 10th in epic. This definitely needs to be done. He is running a low-grade fever today but no other symptoms. No real improvement in the 1 02/26/2019 patient presents today for a follow-up visit here in our clinic he is last been seen in the clinic on October 29. Subsequently we were working on getting MRI to evaluate and see what  exactly was going on and where we would need to go from the standpoint of whether or not he had osteomyelitis and again what treatments were going be required. Subsequently the patient ended up being admitted to the hospital on 02/07/2019 and was discharged on 02/14/2019. This is a somewhat interesting admission with a discharge diagnosis of pneumonia due to COVID-19 although he was positive for COVID-19 when tested at the urgent care but negative x2 when he was actually in the hospital. With that being said he did have acute respiratory failure with hypoxia and it was noted he also have a left foot ulceration with osteomyelitis. With that being said he did require oxygen for his pneumonia and I level 4 L. He was placed on antivirals and steroids for the COVID-19. He was also transferred to the [facility] at one point. Nonetheless he did subsequently discharged home and since being home has done much better in that regard. The CT angiogram did not show any pulmonary embolism. With regard to the osteomyelitis the patient was placed on vancomycin and Zosyn while in the hospital but has been changed to Augmentin at discharge. It was also recommended that he follow- up with wound care and podiatry. Podiatry however wanted him to see [name] according to the patient prior to them doing anything further. His hemoglobin A1c was 9.9 as noted in the hospital. Have an MRI of the left foot performed while in the hospital on 02/04/2019. 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 There is an overlying plantar open wound noted an abscess tracking back along the lateral aspect of the fifth metatarsal shaft. There is otherwise diffuse cellulitis and mild fasciitis without findings of polymyositis. The patient did have recently pneumonia secondary to COVID-19 I looked in the chart through epic and it does appear that the  patient may need to have an additional x-ray just to ensure everything is cleared and that he has no airspace disease prior to putting him into the [name]. 03/05/2019; patient was readmitted to the clinic last week. He  was hospitalized twice for a viral upper respiratory tract infection from 11/1 through 11/4 and then 11/5 through 11/12 ultimately this turned out to be Covid pneumonitis. Although he was discharged on oxygen he is not using it. He says he feels fine. He has no exercise limitation no cough no sputum. His O2 sat in our clinic today was 100% on room air. 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 He received Vanco and Zosyn in the hospital and then was discharged on 2 weeks of Augmentin. I do not see any relevant cultures. He was supposed to follow-up with infectious disease but I do not see that he has an appointment. 12/8; patient saw Dr. Novella Olive of infectious disease last week. He felt that he had had adequate antibiotic therapy. He did not go to follow-up with Dr. Amalia Hailey of podiatry and I have again talked to him about the pros and cons of this. He does not want to consider a ray amputation of this time. He is aware of the risks of recurrence, migration etc. He started HBO today and tolerated this well. He can complete the Augmentin that I gave him last week. I have looked over the lab work that Dr. Chana Bode ordered his C-reactive protein was 3.3 and his sedimentation rate was 17. The C-reactive protein is never really been measurably that high in this patient 12/15; not much change in the wound today however he has undermining along the lateral part of the foot again more extensively than last week. He has some rims of epithelialization. We have been using silver alginate. He is undergoing hyperbarics but did not dive today 12/18; in for his obligatory first total contact  cast change. Unfortunately there was pus coming from the undermining area around his fifth metatarsal head. This was cultured but will preclude reapplication of a cast. He is seen in conjunction with HBO 12/24; patient had staph lugdunensis in the wound in the undermining area laterally last time. We put him on doxycycline which should have covered this. The wound looks better today. I am going to give him another week of doxycycline before reattempting the total contact cast 12/31; the patient is completing antibiotics. Hemorrhagic debris in the distal part of the wound with some undermining distally. He also had hyper granulation. Extensive debridement with a #5 curette. The infected area that was on the lateral part of the fifth met head is closed over. I do not think he needs any more antibiotics. Patient was seen prior to HBO. Preparations for a total contact cast were made in the cast will be placed post hyperbarics 04/11/19; once again the patient arrives today without complaint. He had been in a cast all week noted that he had heavy drainage this week. This resulted in large raised areas of macerated tissue around the wound 1/14; wound bed looks better slightly smaller. Hydrofera Blue has been changing himself. He had a heavy drainage last week which caused a lot of maceration around the wound so I took him out of a total contact cast he says the drainage is actually better this week He is seen today in conjunction with HBO 1/21; returns to clinic. He was up in [place] for a day or 2 attending a funeral. He comes back in with the wound larger and with a large area of exposed bone. He had osteomyelitis and septic arthritis of the fifth left  metatarsal head while he was in hospital. He received IV antibiotics in the hospital for a prolonged period of time then 3 weeks of Augmentin. Subsequently I gave him 2 weeks of doxycycline for more superficial wound infection. When I saw this last week the  wound was smaller the surface of the wound looks satisfactory. 1/28; patient missed hyperbarics today. Bone biopsy I did last time showed Enterococcus faecalis and Staphylococcus lugdunensis . He has a wide area of exposed bone. We are going to use silver alginate as of today. I had another ethical discussion with the patient. 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 In this situation I think the likelihood of healing this is low. Therefore I have recommended a ray amputation and with the patient's agreement I have referred him to Dr. Doran Durand. The other issue is that his compliance with hyperbarics has been minimal because of his work schedule and given his underlying decision I am going to stop this today READMISSION 10/24/2019 MRI 09/29/2019 left foot IMPRESSION: 1. Apparent skin ulceration inferior and lateral to the 5th metatarsal base with underlying heterogeneous T2 signal and enhancement in the subcutaneous fat. Small peripherally enhancing fluid collections along the plantar and lateral aspects of the 5th metatarsal base suspicious for abscesses. 2. Interval amputation through the mid 5th metatarsal with nonspecific low-level marrow edema and enhancement. Given the proximity to the adjacent soft tissue inflammatory changes, osteomyelitis cannot be excluded. 3. The additional bones appear unremarkable. MRI 09/29/2019 right foot IMPRESSION: 1. Soft tissue ulceration lateral to the 5th MTP joint. There is low-level T2 hyperintensity within the 4th and 5th metatarsal heads and adjacent proximal phalanges without abnormal T1 signal or cortical destruction. These findings are nonspecific and could be seen with early marrow edema, hyperemia or early osteomyelitis. No evidence of septic joint. 2. Mild tenosynovitis and synovial enhancement associated with the extensor digitorum tendons at the level of the midfoot. 3. Diffuse low-level muscular T2  hyperintensity and enhancement, most consistent with diabetic myopathy. LEFT FOOT BONE Methicillin resistant staphylococcus aureus Staphylococcus lugdunensis MIC MIC CIPROFLOXACIN >=8 RESISTANT Resistant <=0.5 SENSI... Sensitive CLINDAMYCIN <=0.25 SENS... Sensitive >=8 RESISTANT Resistant ERYTHROMYCIN >=8 RESISTANT Resistant >=8 RESISTANT Resistant GENTAMICIN <=0.5 SENSI... Sensitive <=0.5 SENSI... Sensitive Inducible Clindamycin NEGATIVE Sensitive NEGATIVE Sensitive OXACILLIN >=4 RESISTANT Resistant 2 SENSITIVE Sensitive RIFAMPIN <=0.5 SENSI... Sensitive <=0.5 SENSI... Sensitive TETRACYCLINE <=1 SENSITIVE Sensitive <=1 SENSITIVE Sensitive TRIMETH/SULFA <=10 SENSIT Sensitive <=10 SENSIT Sensitive ... Marland Kitchen.. VANCOMYCIN 1 SENSITIVE Sensitive <=0.5 SENSI... Sensitive Right foot bone . Component 3 wk ago Specimen Description BONE Special Requests RIGHT 4 METATARSAL SAMPLE B Gram Stain NO WBC SEEN NO ORGANISMS SEEN Culture RARE METHICILLIN RESISTANT STAPHYLOCOCCUS AUREUS NO ANAEROBES ISOLATED Performed at [facility], [address]iano [address], [place], [place] [address] Report Status 10/08/2019 FINAL Organism ID, Bacteria METHICILLIN RESISTANT STAPHYLOCOCCUS AUREUS Resulting Agency [facility] Susceptibility Methicillin resistant staphylococcus aureus MIC CIPROFLOXACIN >=8 RESISTANT Resistant CLINDAMYCIN <=0.25 SENS... Sensitive ERYTHROMYCIN >=8 RESISTANT Resistant GENTAMICIN <=0.5 SENSI... Sensitive Inducible Clindamycin NEGATIVE Sensitive OXACILLIN >=4 RESISTANT Resistant RIFAMPIN <=0.5 SENSI... Sensitive TETRACYCLINE <=1 SENSITIVE Sensitive TRIMETH/SULFA <=10 SENSIT Sensitive ... VANCOMYCIN 1 SENSITIVE Sensitive This is a patient we had in clinic earlier this year with a wound over his left fifth metatarsal head. 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 In any case he developed recurrent osteomyelitis and elected  for a left fifth ray amputation which was done by Dr. Doran Durand on 05/16/2019. He seems to have developed problems with wounds on his bilateral feet in June 2021 although he may have had problems earlier than this. He was in an urgent care with a right foot ulcer on 09/26/2019 and given a course of doxycycline. This was apparently after having trouble getting into see orthopedics. He was seen by podiatry on 09/28/2019 noted to have bilateral lower extremity ulcers including the left lateral fifth metatarsal base and the right subfifth met head. It was noted that had purulent drainage at that time. He required hospitalization from 6/20 through 7/2. This was because of worsening right foot wounds. He underwent bilateral operative incision and drainage and bone biopsies bilaterally. Culture results are listed above. He has been referred back to clinic by Dr. Jacqualyn Posey of podiatry. He is also followed by Dr. Megan Salon who saw him yesterday. He was discharged from hospital on Zyvox Flagyl and Levaquin and yesterday changed to doxycycline Flagyl and Levaquin. His inflammatory markers on 6/26 showed a sedimentation rate of 129 and a C-reactive protein of 5. This is improved to 14 and 1.3 respectively. This would indicate improvement. ABIs in our clinic today were 1.23 on the right and 1.20 on the left 11/01/2019 on evaluation today patient appears to be doing fairly well in regard to the wounds on his feet at this point. Fortunately there is no signs of active infection at this time. No fevers, chills, nausea, vomiting, or diarrhea. He currently is seeing infectious disease and still under their care at this point. Subsequently he also has both wounds which she has not been using collagen on as he did not receive that in his packaging he did not call us and let us know that. Apparently that just was missed on the  order. Nonetheless we will get that straightened out today. 8/9-Patient returns for bilateral foot wounds, using Prisma with hydrogel moistened dressings, and the wounds appear stable. Patient using surgical shoes, avoiding much pressure or weightbearing as much as possible 8/16; patient has bilateral foot wounds. 1 on the right lateral foot proximally the other is on the left mid lateral foot. Both required debridement of callus and thick skin around the wounds. We have been using silver collagen 8/27; patient has bilateral lateral foot wounds. The area on the left substantially surrounded by callus and dry skin. This was removed from the wound edge. The underlying wound is small. The area on the right measured somewhat smaller today. 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 Unfortunately I did not update his antibiotics during today's visit. 9/10 I reviewed Dr. Hale Bogus last notes he felt he had completed antibiotics his inflammatory markers were reasonably well controlled. He has a small wound on the lateral left foot and a tiny area on the right which is just above closed. He is using Hydrofera Blue with border foam he has bilateral surgical shoes 9/24; 2 week f/u. doing well. right foot is closed. left foot still undermined. 10/14; right foot remains closed at the fifth met head. The area over the base of the left fifth metatarsal has a small open area but considerable undermining towards the plantar foot. Thick callus skin around this suggests an adequate pressure relief. We have talked about this. He says he is going to go back into his cam boot. I suggested a total contact cast he did not seem enamored with  this suggestion 10/26; left foot base of the fifth metatarsal. Same condition as last time. He has skin over the area with an open wound however the skin is not adherent. He went to see Dr. Earleen Newport who did an x-ray and  culture of his foot I have not reviewed the x-ray but the patient was not told anything. He is on doxycycline 11/11; since the patient was last here he was in the emergency room on 10/30 he was concerned about swelling in the left foot. They did not do any cultures or x-rays. They changed his antibiotics to cephalexin. Previous culture showed group B strep. The cephalexin is appropriate as doxycycline has less than predictable coverage. Arrives in clinic today with swelling over this area under the wound. He also has a new wound on the right fifth metatarsal head 11/18; the patient has a difficult wound on the lateral aspect of the left fifth metatarsal head. The wound was almost ballotable last week I opened it slightly expecting to see purulence however there was just bleeding. I cultured this this was negative. X-ray unchanged. We are trying to get an MRI but I am not sure were going to be able to get this through his insurance. He also has an area on the right lateral fifth metatarsal head this looks healthier 12/3; the patient finally got our MRI. Surprisingly this did not show osteomyelitis. I did show the soft tissue ulceration at the lateral plantar aspect of the fifth metatarsal base with a tiny residual 6 mm abscess overlying the superficial fascia I have tried to culture this area I have not been able to get this to grow anything. Nevertheless the protruding tissue looks aggravated. I suspect we should try to treat the underlying "abscess with broad-spectrum antibiotics. I am going to start him on Levaquin and Flagyl. He has much less edema in his legs and I am going to continue to wrap his legs and see him weekly 12/10. I started Levaquin and Flagyl on him last week. He just picked up the Flagyl apparently there was some delay. The worry is the wound on the left fifth metatarsal base which is substantial and worsening. His foot looks like he inverts at the ankle making this a weightbearing  surface. Certainly no improvement in fact I think the measurements of this are somewhat worse. We have been using 12/17; he apparently just got the Levaquin yesterday this is 2 weeks after the fact. He has completed the Flagyl. The area over the left fifth metatarsal base still has protruding granulation tissue although it does not look quite as bad as it did some weeks ago. He has severe bilateral lymphedema although we have not been treating him for wounds on his legs this is definitely going to require compression. There was so much edema in the left I did not wish to put him in a total contact cast today. I am going to increase his compression from 3-4 layer. The area on the right lateral fifth met head actually look quite good and superficial. 12/23; patient arrived with callus on the right fifth met head and the substantial hyper granulated callused wound on the base of his fifth metatarsal. He says he is completing his Levaquin in 2 days but I do not think that adds up with what I gave him but I will have to double check this. We are using Hydrofera Blue on both areas. My plan is to put the left leg in a cast the week  after New Year's 04/06/2020; patient's wounds about the same. Right lateral fifth metatarsal head and left lateral foot over the base of the fifth metatarsal. There is undermining on the left lateral foot which I removed before application of total contact cast continuing with Hydrofera Blue new. Patient tells me he was seen by endocrinology today lab work was done [Dr. Kerr]. Also wondering whether he was referred to cardiology. I went over some lab work from previously does not have chronic renal failure certainly not nephrotic range proteinuria he does have very poorly controlled diabetes but this is not his most updated lab work. Hemoglobin A1c has been over 11 1/10; the patient had a considerable amount of leakage towards mid part of his left foot with macerated skin however  the wound surface looks better the area on the right lateral fifth met head is better as well. I am going to change the dressing on the left foot under the total contact cast to silver alginate, continue with Hydrofera Blue on the right. 1/20; patient was in the total contact cast for 10 days. Considerable amount of drainage although the skin around the wound does not look too bad on the left foot. The area on the right fifth metatarsal head is closed. Our nursing staff reports large amount of drainage out of the left lateral foot wound 1/25; continues with copious amounts of drainage described by our intake staff. PCR culture I did last week showed E. coli and Enterococcus faecalis and low quantities. Multiple resistance genes documented including extended spectrum beta lactamase, MRSA, MRSE, quinolone, tetracycline. The wound is not quite as good this week as it was 5 days ago but about the same size 2/3; continues with copious amounts of malodorous drainage per our intake nurse. The PCR culture I did 2 weeks ago showed E. coli and low quantities of Enterococcus. There were multiple resistance genes detected. I put Neosporin on him last week although this does not seem to have helped. The wound is slightly deeper today. Offloading continues to be an issue here although with the amount of drainage she has a total contact cast is just not going to work 2/10; moderate amount of drainage. Patient reports he cannot get his stocking on over the dressing. I told him we have to do that the nurse gave him suggestions on how to make this work. The wound is on the bottom and lateral part of his left foot. Is cultured predominantly grew low amounts of Enterococcus, E. coli and anaerobes. There were multiple resistance genes detected including extended spectrum beta lactamase, quinolone, tetracycline. I could not think of an easy oral combination to address this so for now I am going to do topical antibiotics  provided by [facility] I think the main agents here are vancomycin and an aminoglycoside. We have to be able to give him access to the wounds to get the topical antibiotic on 2/17; moderate amount of drainage this is unchanged. He has his Keystone topical antibiotic against the deep tissue culture organisms. He has been using this and changing the dressing daily. Silver alginate on the wound surface. 2/24; using Keystone antibiotic with silver alginate on the top. He had too much drainage for a total contact cast at one point although I think that is improving and I think in the next week or 2 it might be possible to replace a total contact cast I did not do this today. In general the wound surface looks healthy however he continues to  have thick rims of skin and subcutaneous tissue around the wide area of the circumference which I debrided 06/04/2020 upon evaluation today patient appears to be doing well in regard to his wound. I do feel like he is showing signs of improvement. There is little bit of callus and dead tissue around the edges of the wound as well as what appears to be a little bit of a sinus tract that is off to the side laterally I would perform debridement to clear that away today. 3/17; left lateral foot. The wound looks about the same as I remember. Not much depth surface looks healthy. No evidence of infection 3/25; left lateral foot. Wound surface looks about the same. Separating epithelium from the circumference. There really is no evidence of infection here however not making progress by my view 3/29; left lateral foot. Surface of the wound again looks reasonably healthy still thick skin and subcutaneous tissue around the wound margins. There is no evidence of infection. One of the concerns being brought up by the nurses has again the amount of drainage vis--vis continued use of a total contact cast 4/5; left lateral foot at roughly the base of the fifth metatarsal. Nice healthy  looking granulated tissue with rims of epithelialization. The overall wound measurements are not any better but the tissue looks healthy. The only concern is the amount of drainage although he has no surrounding maceration with what we have been doing recently to absorb fluid and protect his skin. He also has lymphedema. 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 He states that the kerlix/coban under the TCC rolled down and could not get it back up. He has been using an offloading scooter and has somehow hurt his right foot using this device. This happened last week. He states that the side of his right foot developed a blister and opened. The top of his foot also has a few small open wounds he thinks is due to his socks rubbing in his shoes. He has not been using any dressings to the wound. He denies purulent drainage, fever/chills or erythema to the wounds. 4/22; patient presents for 1 week follow-up. He developed new wounds to the right foot that were evaluated at last clinic visit. He continues to have a total contact cast to the left leg and he reports no issues. He has been using silver collagen to the right foot wounds with no issues. He denies purulent drainage, fever/chills or erythema to the right foot wounds. He has no complaints today 4/25; patient presents for 1 week follow-up. He has a total contact cast of the left leg and reports no issues. He has been using silver alginate to the right foot wound. He denies purulent drainage, fever/chills or erythema to the right foot wounds. 5/2 patient presents for 1 week follow-up. T contact cast on the left. The wound which is on the base of the plantar foot at the base of the fifth  metatarsal otal actually looks quite good and dimensions continue to gradually contract. HOWEVER the area on the right lateral fifth metatarsal head is much larger than what I remember from 2 weeks ago. Once more is he has significant levels of hypergranulation. Noteworthy that he had this  same hyper granulated response on his wound on the left foot at one point in time. So much so that he I thought there was an underlying fluid collection. Based on this I think this just needs debridement. 5/9; the wound on the left actually continues to be gradually smaller with a healthy surface. Slight amount of drainage and maceration of the skin around but not too bad. However he has a large wound over the right fifth metatarsal head very much in the same configuration as his left foot wound was initially. I used silver nitrate to address the hyper granulated tissue no mechanical debridement 5/16; area on the left foot did not look as healthy this week deeper thick surrounding macerated skin and subcutaneous tissue. The area on the right foot fifth met head was about the same The area on the right ankle that we identified last week is completely broken down into an open wound presumably a stocking rubbing issue 5/23; patient has been using a total contact cast to the left side. He has been using silver alginate underneath. He has also been using silver alginate to the right foot wounds. He has no complaints today. He denies any signs of infection. 5/31; the left-sided wound looks some better measure smaller surface granulation looks better. We have been using silver alginate under the total contact cast The large area on his right fifth met head and right dorsal foot look about the same still using silver alginate 6/6; neither side is good as I was hoping although the surface area dimensions are better. A lot of maceration on his left and right foot around the wound edge. Area on the dorsal right foot looks  better. He says he was traveling. I am not sure what does the amount of maceration around the plantar wounds may be drainage issues 6/13; in general the wound surfaces look quite good on both sides. Macerated skin and raised edges around the wound required debridement although in general especially on the left the surface area seems improved. The area on the right dorsal ankle is about the same I thought this would not be such a problem to close 6/20; not much change in either wound although the one on the right looks a little better. Both wounds have thick macerated edges to the skin requiring debridements. We have been using silver alginate. The area on the dorsal right ankle is still open I thought this would be closed. 6/28; patient comes in today with a marked deterioration in the right foot wound fifth met head. Wide area of exposed bone this is a drastic change from last time. The area on the left there we have been casting is stagnant. We have been using silver alginate in both wound areas. 7/5; bone culture I did for PCR last time was positive for Pseudomonas, group B strep, Enterococcus and Staph aureus. There was no suggestion of methicillin resistance or ampicillin resistant genes. This was resistant to tetracycline however He comes into the clinic today with the area over his right plantar fifth metatarsal head which had been doing so well 2 weeks ago completely necrotic feeling bone. I do not know that this is going to be salvageable. The left foot wound is certainly no smaller but it has a better surface and is superficial. 7/8; patient called in this morning to say that his total contact cast was rubbing against his foot. He states he is doing fine overall. He denies signs of infection. 7/12; continued deterioration in  the wound over the right fifth metatarsal head crumbling bone. This is not going to be salvageable. The patient agrees and wants to be referred to Dr. Doran Durand which we  will attempt to arrange as soon as possible. I am going to continue him on antibiotics as long as that takes so I will renew those today. The area on the left foot which is the base of the fifth metatarsal continues to look somewhat better. Healthy looking tissue no depth no debridement is necessary here. 7/20; the patient was kindly seen by Dr. Doran Durand of orthopedics on 10/19/2020. 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 Towards this end we have taken him out of the total contact cast on the left we will put him in a wrap with Hydrofera Blue. As I understand things surgery is planned for 7/21 7/27; patient had his surgery last Thursday. 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 Apparently everything went well we did not still disturb that today The area on the left foot actually looks quite good. He has been much less mobile which probably explains this he did not seem to do well in the total contact cast secondary to drainage and maceration I think. 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 Fortunately there is no evidence of active infection at this time. No fevers, chills, nausea, vomiting, or diarrhea. Overall I think that he is actually doing extremely well. Nonetheless I do believe that he is staying off of this more following the surgery in his right foot that is the reason the left is doing so great. 8/16; left plantar foot wound. This looks smaller than the last time I saw this he is using Hydrofera Blue. The surgical wound on the right foot is being followed by Dr. Doran Durand we did not look at this today. He has surgical shoes on both feet 8/23; left plantar foot wound not as good this week. Surrounding macerated skin and subcutaneous tissue everything looks moist and wet. I do not think he  is offloading this adequately. He is using a surgical shoe Apparently the right foot surgical wound is not open although I did not check his foot 8/31; left plantar foot lateral aspect. Much improved this week. He has no maceration. Some improvement in the surface area of the wound but most impressively the depth is come in we are using silver alginate. The patient is a Product/process development scientist. He is asked that we write him a letter so he can go back to work. I have also tried to see if we can write something that will allow him to limit the amount of time that he is on his foot at work. 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 Hopefully the latter can be avoided 9/6; I believe the patient missed an appointment last week. He arrives in today with a wound looking roughly the same certainly no better. Undermining laterally and also inferiorly. We used molecuLight today in training with the patient's permission.. We are using silver alginate 9/21 wound is measuring bigger this week although this may have to do with the aggressive circumferential debridement last week in response to the blush fluorescence on the  MolecuLight. Culture I did last week showed significant MSSA and E. coli. I put him on Augmentin but he has not started it yet. We are also going to send this for compounded antibiotics at [facility]. There is no evidence of systemic infection 9/29; silver alginate. His Keystone arrived. He is completing Augmentin in 2 days. Offloading in a cam boot. Moderate drainage per our intake staff 10/5; using silver alginate. He has been using his [place]. He has completed his Augmentin. Per our intake nurse still a lot of drainage, far too much to consider a total contact cast. Wound measures about the same. He had the same undermining area that I defined last week from a roughly 11-3. I remove this today 10/12; using silver  alginate he is using the [place]. He comes in for a nurse visit hence we are applying [employer] twice a week. Measuring slightly better today and less notable drainage. Extensive debridement of the wound edge last time 10/18; using topical Keystone and silver alginate and a soft cast. Wound measurements about the same. Drainage was through his soft cast. We are changing this twice a week Tuesdays and Friday 10/25; comes in with moderate drainage. Still using Keystone silver alginate and a soft cast. Wound dimensions completely the same.He has a lot of edema in the left leg he has lymphedema. Asking for [name] to consider wrapping him as he cannot get his stocking on over the soft cast 11/2; comes in with moderate to large drainage slightly smaller in terms of width we have been using [place]. His wound looks satisfactory but not much improvement 11/4; patient presents today for obligatory cast change. Has no issues or complaints today. He denies signs of infection. 11/9; patient traveled this weekend to [place], was on the cast quite a bit. Staining of the cast with black material from his walking boot. Drainage was not quite as bad as we feared. Using silver alginate and Keystone 11/16; we do not have size for cast therefore we have been putting a soft cast on him since the change on Friday. Still a significant amount of drainage necessitating changing twice a week. We have been using the Keystone at cast changes either hard or soft as well as silver alginate Comes in the clinic with things actually looking fairly good improvement in width. He says his offloading is about the same 02/24/2021 upon evaluation today patient actually comes back in and is doing excellent in regard to his foot ulcer this is significantly smaller even compared to the last visit. The soft cast seems to have done extremely well for him which is great news. I do not see any signs of infection minimal debridement will be needed  today. 11/30; left lateral foot much improved half a centimeter improvement in surface area. No evidence of infection. He seems to be doing better with the soft cast in the TCC therefore we will continue with this. He comes back in later in the week for a change with the nurses. This is due to drainage 12/6; no improvement in dimensions. Under illumination some debris on the surface we have been using silver alginate, soft cast. If there is anything optimistic here he seems to have have less drainage 12/13. Dimensions are improved both length and width and slightly in depth. Appears to be quite healthy today. Raised edges of this thick skin and callus around the edges however. He is in a soft cast were bringing him back once for a change on Friday. Drainage is  better 12/20. Dimensions are improved. He still has raised edges of thick skin and callus around the edges. We are using a soft cast 12/28; comes in today with thick callus around the wound. Using silver under alginate under a soft cast. I do not think there is much improvement in any measurement 2023 04/06/2021; patient was put in a total contact cast. Unfortunately not much change in surface area 1/10; not much different still thick callus and skin around the edge in spite of the total contact cast. This was just debrided last week we have been using the [facility] compounded antibiotic and silver alginate under a total contact cast 1/18 the patient's wound on the left side is doing nicely. smaller HOWEVER he comes in today with a wound on the right foot laterally. blister most likely serosangquenous drainage 1/24; the patient continues to do well in terms of the plantar left foot which is continued to contract using silver alginate under the total contact cast HOWEVER the right lateral foot is bigger with denuded skin around the edges. I used pickups and a #15 scalpel to remove this this looks like the remanence of a large blister. Cannot rule  out infection. Culture in this area I did last week showed Staphylococcus lugdunensis few colonies. I am going to try to address this with his [employer] antibiotic that is done so well on the left having linezolid and this should cover the staph 2/1; the patient's wound on his left foot which was the original plantar foot wound thick skin and eschar around the edges even in the total contact cast but the wound surface does not look too bad The real problem is on how his right lateral foot at roughly the base of the fifth metatarsal. The wound is completely necrotic more worrisome than that there is swelling around the edges of this. We have been using silver alginate on both wounds and Keystone on the right foot. Unfortunately I think he is going to require systemic antibiotics while we await cultures. He did not get the x-ray done that we ordered last week [lost the prescription 2/7; disappointingly in the area on the left foot which we are treating with a total contact cast is still not closed although it is much smaller. He continues to have a lot of callus around the wound edge. -Right lateral foot culture I did last week was negative x-ray also negative for osteomyelitis. 2/15: TCC silver alginate on the left and silver alginate on the right lateral. No real improvement in either area 05/26/2021: T oday, the wounds are roughly the same size as at his previous visit, post-debridement. He continues to endorse fairly substantial drainage, particularly on the right. He has been in a total contact cast on the left. There is still some callus surrounding this lesion. On the right, the periwound skin is quite macerated, along with surrounding callus. The center of the right-sided wound also has some dark, densely adherent material, which is very difficult to remove. 06/02/2021: Today, both wounds are slightly smaller. He has been using zinc oxide ointment around the right ulcer and the degree of maceration  has improved markedly. There continues to be an area of nonviable tissue in the center of the right sided ulcer. The left-sided wound, which has been in the total contact cast. Appears clean and the degree of callus around it is less than previously. 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 He  had to take the stairs and both wounds have increased in size. The left foot, which has been in a total contact cast, has developed a tunnel tracking to the lateral aspect of his foot. The nonviable tissue in the center of the right-sided ulcer remains recalcitrant to debridement. There is significant undermining surrounding the entirety of the left sided wound. 06/16/2021: The elevator at school has been fixed and the patient has been able to avoid putting as much weight on his wounds over the past week. We converted the left foot wound into a single lesion today, but despite this, the wound is actually smaller. The base is healthy with limited periwound callus. On the right, the central necrotic area is still present. He continues to be quite macerated around the right sided wound, despite applying barrier cream. This does, however, have the benefit of softening the callus to make it more easily removable. 06/23/2021: Today, the left wound is smaller. The lateral aspect that had opened up previously is now closed. The wound base has a healthy bed of granulation tissue and minimal slough. Unfortunately, on the right, the wound is larger and continues to be fairly macerated. He has also reopened the wound at his right ankle. He thinks this is due to the gait he has adopted secondary to his total contact cast and boot. 06/30/2021: T oday, both wounds are a little bit larger. The lateral aspect on the left has remained closed. He continues to have significant periwound maceration. The culture that I took from the right sided wound grew a population of  bacteria that is not covered by his current [facility] antibiotic. The center of the right- sided wound continues to appear necrotic with nonviable fat. It probes deeper today, but does not reach bone. 07/07/2021: The periwound maceration is a little bit less today. The right lateral foot wound has some areas that appear more viable and the necrotic center also looks a little bit better. The wound on the dorsal surface of his right foot near the ankle is contracting and the surface appears healthy. The left plantar wound surface looks healthy, but there is some new undermining on the medial portion. He did get his new Keystone antibiotic and began applying that to the right foot wound on Saturday. 07/14/2021: The intake nurse reported substantial drainage from his wounds, but the periwound skin actually looks better than is typical for him. The wound on the dorsal surface of his right foot near the ankle is smaller and just has a small open area underneath some dried eschar. The left plantar wound surface looks healthy and there has been no significant accumulation of callus. The right lateral foot wound looks quite a bit better, with the central portion, which typically appears necrotic, looking more viable albeit pale. 07/22/2021: His left foot is extremely macerated today. The wound is about the same size. The wound on the dorsal surface of his right foot near the ankle had closed, but he traumatized it removing the dressing and there is a tiny skin tear in that location. The right lateral foot wound is bigger, but the surface appears healthy. 07/30/2021: The wound on the dorsal surface of his right foot near the ankle is closed. The right lateral foot wound again is a little bit bigger due to some undermining. The periwound skin is in better condition, however. He has been applying zinc oxide. The wound surface is a little bit dry today. On the left, he does not have the substantial  maceration that we  frequently see. The wound itself is smaller and has a clean surface. 08/06/2021: Both wounds seem to have deteriorated over the past week. The right lateral foot wound has a dry surface but the periwound is boggy.. Overall wound dimensions are about the same. On the left, the wound is about the same size, but there is more undermining present underneath periwound callus. 08/13/2021: The right sided wound looks about the same, but on the left there has been substantial deterioration. The undermining continues to extend under periwound callus. Once this was removed, substantial extension of the wound was present. There is no odor or purulent drainage but clearly the wounds have broken down. 08/20/2021: The wounds look about the same today. He has been out of his total contact cast and has just been changing the dressings using topical Keystone with PolyMem Ag, Kerlix and Ace bandages. The wound on the top of his right ankle has reopened but this is quite small. There was a little bit of purulent material that I expressed when examining this wound. 08/24/2021: After the aggressive debridement I performed at his last visit, the wounds actually look a little bit better today. They are smaller with the exception of the wound on the top of his right ankle which is a little bit bigger as some more skin pulled off when he was changing his dressing. We are using topical Keystone with PolyMem Ag Kerlix and Ace bandages. Electronic Signature(s) Signed: 08/24/2021 5:20:38 PM By: [name] MD FACS Entered By: [name] on 08/24/2021 17:20:38 -------------------------------------------------------------------------------- Physical Exam Details Patient Name: Date of Service: [name], [name] 08/24/2021 3:15 PM Medical Record Number: [id number] Patient Account Number: [id number] Date of Birth/Sex: Treating RN: 1985-05-25 (36 y.o. [name] Primary Care Provider: [name] Other  Clinician: Referring Provider: Treating Provider/Extender: [name] in Treatment: 95 Constitutional . . . . No acute distress. Respiratory Normal work of breathing on room air. Notes 08/24/2021: The wounds on his plantar foot surfaces are both a little bit smaller today. There is still some undermining on the left ulcer, but the surfaces of both sites are clean. There is still necrotic fat in the middle of the left ulcer. The wound on the top of his foot near his ankle has just some slough and eschar accumulation. Electronic Signature(s) Signed: 08/24/2021 5:21:53 PM By: [name] MD FACS Entered By: [name] on 08/24/2021 17:21:52 -------------------------------------------------------------------------------- Physician Orders Details Patient Name: Date of Service: [name], [name] 08/24/2021 3:15 PM Medical Record Number: [id number] Patient Account Number: [id number] Date of Birth/Sex: Treating RN: 1985/04/09 (36 y.o. 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 - Dr. [name] - Room 4 - Thursday 6/1 at 3:15 Return A Bathing/ Shower/ Hygiene May shower with protection but do not get wound dressing(s) wet. - Use Cast Protector Bag on Right and left legs Edema Control - Lymphedema / SCD / Other Bilateral Lower Extremities Elevate legs to the level of the heart or above for 30  minutes daily and/or when sitting, a frequency of: - throughout the day Avoid standing for long periods of time. Exercise regularly Compression stocking or Garment 20-30 mm/Hg pressure to: - to right leg daily Off-Loading Open toe surgical shoe to: - right foot Other: - Soft cast to left foot Additional Orders / Instructions Follow Nutritious Diet Wound Treatment Wound #11 - Foot Wound Laterality: Right, Anterior Cleanser: Soap and Water 1 x Per Day/15 Days Discharge Instructions: May shower and wash wound with dial antibacterial soap and water prior to dressing change. Cleanser: Byram Ancillary Kit - 15 Day Supply (Generic) 1 x Per Day/15 Days Discharge Instructions: Use supplies as instructed; Kit contains: (15) Saline Bullets; (15) 3x3 Gauze; 15 pr Gloves Topical: keystone antibiotic compound 1 x Per Day/15 Days Prim Dressing: PolyMem Silver Non-Adhesive Dressing, 4.25x4.25 in (Generic) 1 x Per Day/15 Days ary Discharge Instructions: Apply to wound bed as instructed Secondary Dressing: ABD Pad, 8x10 (Generic) 1 x Per Day/15 Days Discharge Instructions: Apply over primary dressing as directed. Secondary Dressing: Optifoam Non-Adhesive Dressing, 4x4 in (Generic) 1 x Per Day/15 Days Discharge Instructions: Apply over primary dressing as directed. Secondary Dressing: Zetuvit Plus 4x8 in 1 x Per Day/15 Days Discharge Instructions: Apply over primary dressing as directed. Secondary Dressing: NonWoven Sponge, 4x4 (in/in) (Generic) 1 x Per Day/15 Days Secured With: Coban Self-Adherent Wrap 4x5 (in/yd) (Generic) 1 x Per Day/15 Days Discharge Instructions: Secure with Coban as directed. Secured With: [employer], 4.5x3.1 (in/yd) (Generic) 1 x Per Day/15 Days Discharge Instructions: Secure with Kerlix as directed. Secured With: 38M Medipore H Soft Cloth Surgical T ape, 4 x 10 (in/yd) (Generic) 1 x Per Day/15 Days Discharge Instructions: Secure with tape as directed. Wound #3 - Foot  Wound Laterality: Plantar, Left, Lateral Cleanser: Soap and Water 1 x Per Day/15 Days Discharge Instructions: May shower and wash wound with dial antibacterial soap and water prior to dressing change. Cleanser: Byram Ancillary Kit - 15 Day Supply (Generic) 1 x Per Day/15 Days Discharge Instructions: Use supplies as instructed; Kit contains: (15) Saline Bullets; (15) 3x3 Gauze; 15 pr Gloves Topical: keystone antibiotic compound 1 x Per Day/15 Days Prim Dressing: PolyMem Silver Non-Adhesive Dressing, 4.25x4.25 in (Generic) 1 x Per Day/15 Days ary Discharge Instructions: Apply to wound bed as instructed Secondary Dressing: ABD Pad, 8x10 (Generic) 1 x Per Day/15 Days Discharge Instructions: Apply over primary dressing as directed. Secondary Dressing: Optifoam Non-Adhesive Dressing, 4x4 in (Generic) 1 x Per Day/15 Days Discharge Instructions: Apply over primary dressing as directed. Secondary Dressing: Zetuvit Plus 4x8 in 1 x Per Day/15 Days Discharge Instructions: Apply over primary dressing as directed. Secondary Dressing: NonWoven Sponge, 4x4 (in/in) (Generic) 1 x Per Day/15 Days Secured With: Coban Self-Adherent Wrap 4x5 (in/yd) (Generic) 1 x Per Day/15 Days Discharge Instructions: Secure with Coban as directed. Secured With: [employer], 4.5x3.1 (in/yd) (Generic) 1 x Per Day/15 Days Discharge Instructions: Secure with Kerlix as directed. Secured With: 38M Medipore H Soft Cloth Surgical T ape, 4 x 10 (in/yd) (Generic) 1 x Per Day/15 Days Discharge Instructions: Secure with tape as directed. Wound #8 - Foot Wound Laterality: Right, Lateral Cleanser: Soap and Water 1 x Per HWE/99 Days Discharge Instructions: May shower and wash wound with dial antibacterial soap and water prior to dressing change. Cleanser: Byram Ancillary Kit - 15 Day Supply (Generic) 1 x Per Day/15 Days Discharge Instructions: Use supplies as instructed; Kit contains: (15) Saline Bullets; (15) 3x3 Gauze; 15 pr  Gloves Topical: keystone antibiotic  compound 1 x Per Day/15 Days Prim Dressing: PolyMem Silver Non-Adhesive Dressing, 4.25x4.25 in (Generic) 1 x Per Day/15 Days ary Discharge Instructions: Apply to wound bed as instructed Secondary Dressing: ABD Pad, 8x10 (Generic) 1 x Per Day/15 Days Discharge Instructions: Apply over primary dressing as directed. Secondary Dressing: Optifoam Non-Adhesive Dressing, 4x4 in (Generic) 1 x Per Day/15 Days Discharge Instructions: Apply over primary dressing as directed. Secondary Dressing: Zetuvit Plus 4x8 in 1 x Per Day/15 Days Discharge Instructions: Apply over primary dressing as directed. Secondary Dressing: NonWoven Sponge, 4x4 (in/in) (Generic) 1 x Per Day/15 Days Secured With: Coban Self-Adherent Wrap 4x5 (in/yd) (Generic) 1 x Per Day/15 Days Discharge Instructions: Secure with Coban as directed. Secured With: [employer], 4.5x3.1 (in/yd) (Generic) 1 x Per Day/15 Days Discharge Instructions: Secure with Kerlix as directed. Secured With: 54M Medipore H Soft Cloth Surgical T ape, 4 x 10 (in/yd) (Generic) 1 x Per Day/15 Days Discharge Instructions: Secure with tape as directed. Electronic Signature(s) Signed: 08/24/2021 5:26:11 PM By: [name] MD FACS Entered By: [name] on 08/24/2021 17:22:09 -------------------------------------------------------------------------------- Problem List Details Patient Name: Date of Service: [name], [name] 08/24/2021 3:15 PM Medical Record Number: [id number] Patient Account Number: [id number] Date of Birth/Sex: Treating RN: 05/02/1985 (36 y.o. 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 08/10/2020 No Yes Inactive Problems ICD-10 Code Description Active Date Inactive Date L97.518 Non-pressure chronic ulcer of other part of right foot with other specified severity 07/14/2020 07/14/2020 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, right ankle and foot 10/24/2019 10/24/2019 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, left ankle and foot 10/24/2019 10/24/2019 B95.62 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 10/24/2019 10/24/2019 classified elsewhere Resolved Problems Electronic Signature(s) Signed: 08/24/2021 5:17:49 PM By: [name] MD FACS Entered By: [name] on 08/24/2021 17:17:49 -------------------------------------------------------------------------------- Progress Note Details Patient Name: Date of Service: [name], [name] 08/24/2021 3:15 PM Medical Record Number: [id number] Patient Account Number: [id number] Date of Birth/Sex: Treating RN: 06-18-1985 (36 y.o. 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 He comes in for review of a wound over the plantar fifth  metatarsal head extending into the lateral part of the foot. He was followed for this previously by his podiatrist Dr. Cornelius Moras. As the patient tells his story he went to see podiatry first for a swelling he developed on the lateral part of his fifth metatarsal head in May. He states this was "open" by podiatry and the area closed. He was followed up in June and it was again opened  callus removed and it closed promptly. There were plans being made for surgery on the fifth metatarsal head in June however his blood sugar was apparently too high for anesthesia. Apparently the area was debrided and opened again in June and it is never closed since. Looking over the records from podiatry I am really not able to follow this. It was clear when he was first seen it was before 5/14 at that point he already had a wound. By 5/17 the ulcer was resolved. I do not see anything about a procedure. On 5/28 noted to have pre-ulcerative moderate keratosis. X-ray noted 1/5 contracted toe and tailor's bunion and metatarsal deformity. On a visit date on 09/28/2018 the dorsal part of the left foot it healed and resolved. There was concern about swelling in his lower extremity he was sent to the ER.. As far as I can tell he was seen in the ER on 7/12 with an ulcer on his left foot. A DVT rule out of the left leg was negative. I do not think I have complete records from podiatry but I am not able to verify the procedures this patient states he had. He states after the last procedure the wound has never closed although I am not able to follow this in the records I have from podiatry. He has not had a recent x-ray The patient has been using Neosporin on the wound. He is wearing a Darco shoe. He is still very active up on his foot working and exercising. Past medical history; type 2 diabetes ketosis-prone, leg swelling with a negative DVT study in July. Non-smoker ABI in our clinic was 0.85 on the left 10/16; substantial wound on  the plantar left fifth met head extending laterally almost to the dorsal fifth MTP. We have been using silver alginate we gave him a Darco forefoot off loader. An x-ray did not show evidence of osteomyelitis did note soft tissue emphysema which I think was due to gas tracking through an open wound. There is no doubt in my mind he requires an MRI 10/23; MRI not booked until 3 November at the earliest this is largely due to his glucose sensor in the right arm. We have been using silver alginate. There has been an improvement 10/29; I am still not exactly sure when his MRI is booked for. He says it is the third but it is the 10th in epic. This definitely needs to be done. He is running a low-grade fever today but no other symptoms. No real improvement in the 1 02/26/2019 patient presents today for a follow-up visit here in our clinic he is last been seen in the clinic on October 29. Subsequently we were working on getting MRI to evaluate and see what exactly was going on and where we would need to go from the standpoint of whether or not he had osteomyelitis and again what treatments were going be required. Subsequently the patient ended up being admitted to the hospital on 02/07/2019 and was discharged on 02/14/2019. This is a somewhat interesting admission with a discharge diagnosis of pneumonia due to COVID-19 although he was positive for COVID-19 when tested at the urgent care but negative x2 when he was actually in the hospital. With that being said he did have acute respiratory failure with hypoxia and it was noted he also have a left foot ulceration with osteomyelitis. With that being said he did require oxygen for his pneumonia and I level 4 L. He was placed on  antivirals and steroids for the COVID-19. He was also transferred to the [place] at one point. Nonetheless he did subsequently discharged home and since being home has done much better in that regard. The CT angiogram did not  show any pulmonary embolism. With regard to the osteomyelitis the patient was placed on vancomycin and Zosyn while in the hospital but has been changed to Augmentin at discharge. It was also recommended that he follow- up with wound care and podiatry. Podiatry however wanted him to see [name] according to the patient prior to them doing anything further. His hemoglobin A1c was 9.9 as noted in the hospital. Have an MRI of the left foot performed while in the hospital on 02/04/2019. 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 There is an overlying plantar open wound noted an abscess tracking back along the lateral aspect of the fifth metatarsal shaft. There is otherwise diffuse cellulitis and mild fasciitis without findings of polymyositis. The patient did have recently pneumonia secondary to COVID-19 I looked in the chart through epic and it does appear that the patient may need to have an additional x-ray just to ensure everything is cleared and that he has no airspace disease prior to putting him into the [name]. 03/05/2019; patient was readmitted to the clinic last week. He was hospitalized twice for a viral upper respiratory tract infection from 11/1 through 11/4 and then 11/5 through 11/12 ultimately this turned out to be Covid pneumonitis. Although he was discharged on oxygen he is not using it. He says he feels fine. He has no exercise limitation no cough no sputum. His O2 sat in our clinic today was 100% on room air. 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 He received Vanco and Zosyn in the hospital and then was discharged on 2 weeks of Augmentin. I do not see any relevant cultures. He was supposed to follow-up with infectious disease but I do not see that he has an appointment. 12/8;  patient saw Dr. Novella Olive of infectious disease last week. He felt that he had had adequate antibiotic therapy. He did not go to follow-up with Dr. Amalia Hailey of podiatry and I have again talked to him about the pros and cons of this. He does not want to consider a ray amputation of this time. He is aware of the risks of recurrence, migration etc. He started HBO today and tolerated this well. He can complete the Augmentin that I gave him last week. I have looked over the lab work that Dr. Chana Bode ordered his C-reactive protein was 3.3 and his sedimentation rate was 17. The C-reactive protein is never really been measurably that high in this patient 12/15; not much change in the wound today however he has undermining along the lateral part of the foot again more extensively than last week. He has some rims of epithelialization. We have been using silver alginate. He is undergoing hyperbarics but did not dive today 12/18; in for his obligatory first total contact cast change. Unfortunately there was pus coming from the undermining area around his fifth metatarsal head. This was cultured but will preclude reapplication of a cast. He is seen in conjunction with HBO 12/24; patient had staph lugdunensis in the wound in the undermining area laterally last time. We put him on doxycycline which should  have covered this. The wound looks better today. I am going to give him another week of doxycycline before reattempting the total contact cast 12/31; the patient is completing antibiotics. Hemorrhagic debris in the distal part of the wound with some undermining distally. He also had hyper granulation. Extensive debridement with a #5 curette. The infected area that was on the lateral part of the fifth met head is closed over. I do not think he needs any more antibiotics. Patient was seen prior to HBO. Preparations for a total contact cast were made in the cast will be placed post hyperbarics 04/11/19; once again the patient  arrives today without complaint. He had been in a cast all week noted that he had heavy drainage this week. This resulted in large raised areas of macerated tissue around the wound 1/14; wound bed looks better slightly smaller. Hydrofera Blue has been changing himself. He had a heavy drainage last week which caused a lot of maceration around the wound so I took him out of a total contact cast he says the drainage is actually better this week He is seen today in conjunction with HBO 1/21; returns to clinic. He was up in [place] for a day or 2 attending a funeral. He comes back in with the wound larger and with a large area of exposed bone. He had osteomyelitis and septic arthritis of the fifth left metatarsal head while he was in hospital. He received IV antibiotics in the hospital for a prolonged period of time then 3 weeks of Augmentin. Subsequently I gave him 2 weeks of doxycycline for more superficial wound infection. When I saw this last week the wound was smaller the surface of the wound looks satisfactory. 1/28; patient missed hyperbarics today. Bone biopsy I did last time showed Enterococcus faecalis and Staphylococcus lugdunensis . He has a wide area of exposed bone. We are going to use silver alginate as of today. I had another ethical discussion with the patient. 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 In this situation I think the likelihood of healing this is low. Therefore I have recommended a ray amputation and with the patient's agreement I have referred him to Dr. Doran Durand. The other issue is that his compliance with hyperbarics has been minimal because of his work schedule and given his underlying decision I am going to stop this today READMISSION 10/24/2019 MRI 09/29/2019 left foot IMPRESSION: 1. Apparent skin ulceration inferior and lateral to the 5th metatarsal base with underlying heterogeneous T2 signal and enhancement in the subcutaneous  fat. Small peripherally enhancing fluid collections along the plantar and lateral aspects of the 5th metatarsal base suspicious for abscesses. 2. Interval amputation through the mid 5th metatarsal with nonspecific low-level marrow edema and enhancement. Given the proximity to the adjacent soft tissue inflammatory changes, osteomyelitis cannot be excluded. 3. The additional bones appear unremarkable. MRI 09/29/2019 right foot IMPRESSION: 1. Soft tissue ulceration lateral to the 5th MTP joint. There is low-level T2 hyperintensity within the 4th and 5th metatarsal heads and adjacent proximal phalanges without abnormal T1 signal or cortical destruction. These findings are nonspecific and could be seen with early marrow edema, hyperemia or early osteomyelitis. No evidence of septic joint. 2. Mild tenosynovitis and synovial enhancement associated with the extensor digitorum tendons at the level of the midfoot. 3. Diffuse low-level muscular T2 hyperintensity and enhancement, most consistent with diabetic myopathy. LEFT FOOT BONE Methicillin resistant staphylococcus aureus Staphylococcus lugdunensis MIC MIC CIPROFLOXACIN >=8 RESISTANT Resistant <=0.5  SENSI... Sensitive CLINDAMYCIN <=0.25 SENS... Sensitive >=8 RESISTANT Resistant ERYTHROMYCIN >=8 RESISTANT Resistant >=8 RESISTANT Resistant GENTAMICIN <=0.5 SENSI... Sensitive <=0.5 SENSI... Sensitive Inducible Clindamycin NEGATIVE Sensitive NEGATIVE Sensitive OXACILLIN >=4 RESISTANT Resistant 2 SENSITIVE Sensitive RIFAMPIN <=0.5 SENSI... Sensitive <=0.5 SENSI... Sensitive TETRACYCLINE <=1 SENSITIVE Sensitive <=1 SENSITIVE Sensitive TRIMETH/SULFA <=10 SENSIT Sensitive <=10 SENSIT Sensitive ... Marland Kitchen.. VANCOMYCIN 1 SENSITIVE Sensitive <=0.5 SENSI... Sensitive Right foot bone . Component 3 wk ago Specimen Description BONE Special Requests RIGHT 4 METATARSAL SAMPLE B Gram Stain NO WBC SEEN NO ORGANISMS SEEN Culture RARE METHICILLIN RESISTANT  STAPHYLOCOCCUS AUREUS NO ANAEROBES ISOLATED Performed at [facility], [place] [address], [place], [place] [address] Report Status 10/08/2019 FINAL Organism ID, Bacteria METHICILLIN RESISTANT STAPHYLOCOCCUS AUREUS Resulting Agency [facility] Susceptibility Methicillin resistant staphylococcus aureus MIC CIPROFLOXACIN >=8 RESISTANT Resistant CLINDAMYCIN <=0.25 SENS... Sensitive ERYTHROMYCIN >=8 RESISTANT Resistant GENTAMICIN <=0.5 SENSI... Sensitive Inducible Clindamycin NEGATIVE Sensitive OXACILLIN >=4 RESISTANT Resistant RIFAMPIN <=0.5 SENSI... Sensitive TETRACYCLINE <=1 SENSITIVE Sensitive TRIMETH/SULFA <=10 SENSIT Sensitive ... VANCOMYCIN 1 SENSITIVE Sensitive This is a patient we had in clinic earlier this year with a wound over his left fifth metatarsal head. 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 Doran Durand on 05/16/2019. He seems to have developed problems with wounds on his bilateral feet in June 2021 although he may have had problems earlier than this. He was in an urgent care with a right foot ulcer on 09/26/2019 and given a course of doxycycline. This was apparently after having trouble getting into see orthopedics. He was seen by podiatry on 09/28/2019 noted to have bilateral lower extremity ulcers including the left lateral fifth metatarsal base and the right subfifth met head. It was noted that had purulent drainage at that time. He required hospitalization from 6/20 through 7/2. This was because of worsening right foot wounds. He underwent bilateral operative incision and drainage and bone biopsies bilaterally. Culture results are listed above. He has been referred back to clinic by Dr. Jacqualyn Posey  of podiatry. He is also followed by Dr. Megan Salon who saw him yesterday. He was discharged from hospital on Zyvox Flagyl and Levaquin and yesterday changed to doxycycline Flagyl and Levaquin. His inflammatory markers on 6/26 showed a sedimentation rate of 129 and a C-reactive protein of 5. This is improved to 14 and 1.3 respectively. This would indicate improvement. ABIs in our clinic today were 1.23 on the right and 1.20 on the left 11/01/2019 on evaluation today patient appears to be doing fairly well in regard to the wounds on his feet at this point. Fortunately there is no signs of active infection at this time. No fevers, chills, nausea, vomiting, or diarrhea. He currently is seeing infectious disease and still under their care at this point. Subsequently he also has both wounds which she has not been using collagen on as he did not receive that in his packaging he did not call us and let us know that. Apparently that just was missed on the order. Nonetheless we will get that straightened out today. 8/9-Patient returns for bilateral foot wounds, using Prisma with hydrogel moistened dressings, and the wounds appear stable. Patient using surgical shoes, avoiding much pressure or weightbearing as much as possible 8/16; patient has bilateral foot wounds. 1 on  the right lateral foot proximally the other is on the left mid lateral foot. Both required debridement of callus and thick skin around the wounds. We have been using silver collagen 8/27; patient has bilateral lateral foot wounds. The area on the left substantially surrounded by callus and dry skin. This was removed from the wound edge. The underlying wound is small. The area on the right measured somewhat smaller today. 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 Unfortunately I did not update his antibiotics during today's visit. 9/10 I reviewed Dr. Hale Bogus last notes he felt  he had completed antibiotics his inflammatory markers were reasonably well controlled. He has a small wound on the lateral left foot and a tiny area on the right which is just above closed. He is using Hydrofera Blue with border foam he has bilateral surgical shoes 9/24; 2 week f/u. doing well. right foot is closed. left foot still undermined. 10/14; right foot remains closed at the fifth met head. The area over the base of the left fifth metatarsal has a small open area but considerable undermining towards the plantar foot. Thick callus skin around this suggests an adequate pressure relief. We have talked about this. He says he is going to go back into his cam boot. I suggested a total contact cast he did not seem enamored with this suggestion 10/26; left foot base of the fifth metatarsal. Same condition as last time. He has skin over the area with an open wound however the skin is not adherent. He went to see Dr. Earleen Newport who did an x-ray and culture of his foot I have not reviewed the x-ray but the patient was not told anything. He is on doxycycline 11/11; since the patient was last here he was in the emergency room on 10/30 he was concerned about swelling in the left foot. They did not do any cultures or x-rays. They changed his antibiotics to cephalexin. Previous culture showed group B strep. The cephalexin is appropriate as doxycycline has less than predictable coverage. Arrives in clinic today with swelling over this area under the wound. He also has a new wound on the right fifth metatarsal head 11/18; the patient has a difficult wound on the lateral aspect of the left fifth metatarsal head. The wound was almost ballotable last week I opened it slightly expecting to see purulence however there was just bleeding. I cultured this this was negative. X-ray unchanged. We are trying to get an MRI but I am not sure were going to be able to get this through his insurance. He also has an area on the right  lateral fifth metatarsal head this looks healthier 12/3; the patient finally got our MRI. Surprisingly this did not show osteomyelitis. I did show the soft tissue ulceration at the lateral plantar aspect of the fifth metatarsal base with a tiny residual 6 mm abscess overlying the superficial fascia I have tried to culture this area I have not been able to get this to grow anything. Nevertheless the protruding tissue looks aggravated. I suspect we should try to treat the underlying "abscess with broad-spectrum antibiotics. I am going to start him on Levaquin and Flagyl. He has much less edema in his legs and I am going to continue to wrap his legs and see him weekly 12/10. I started Levaquin and Flagyl on him last week. He just picked up the Flagyl apparently there was some delay. The worry is the  wound on the left fifth metatarsal base which is substantial and worsening. His foot looks like he inverts at the ankle making this a weightbearing surface. Certainly no improvement in fact I think the measurements of this are somewhat worse. We have been using 12/17; he apparently just got the Levaquin yesterday this is 2 weeks after the fact. He has completed the Flagyl. The area over the left fifth metatarsal base still has protruding granulation tissue although it does not look quite as bad as it did some weeks ago. He has severe bilateral lymphedema although we have not been treating him for wounds on his legs this is definitely going to require compression. There was so much edema in the left I did not wish to put him in a total contact cast today. I am going to increase his compression from 3-4 layer. The area on the right lateral fifth met head actually look quite good and superficial. 12/23; patient arrived with callus on the right fifth met head and the substantial hyper granulated callused wound on the base of his fifth metatarsal. He says he is completing his Levaquin in 2 days but I do not think  that adds up with what I gave him but I will have to double check this. We are using Hydrofera Blue on both areas. My plan is to put the left leg in a cast the week after New Year's 04/06/2020; patient's wounds about the same. Right lateral fifth metatarsal head and left lateral foot over the base of the fifth metatarsal. There is undermining on the left lateral foot which I removed before application of total contact cast continuing with Hydrofera Blue new. Patient tells me he was seen by endocrinology today lab work was done [Dr. Kerr]. Also wondering whether he was referred to cardiology. I went over some lab work from previously does not have chronic renal failure certainly not nephrotic range proteinuria he does have very poorly controlled diabetes but this is not his most updated lab work. Hemoglobin A1c has been over 11 1/10; the patient had a considerable amount of leakage towards mid part of his left foot with macerated skin however the wound surface looks better the area on the right lateral fifth met head is better as well. I am going to change the dressing on the left foot under the total contact cast to silver alginate, continue with Hydrofera Blue on the right. 1/20; patient was in the total contact cast for 10 days. Considerable amount of drainage although the skin around the wound does not look too bad on the left foot. The area on the right fifth metatarsal head is closed. Our nursing staff reports large amount of drainage out of the left lateral foot wound 1/25; continues with copious amounts of drainage described by our intake staff. PCR culture I did last week showed E. coli and Enterococcus faecalis and low quantities. Multiple resistance genes documented including extended spectrum beta lactamase, MRSA, MRSE, quinolone, tetracycline. The wound is not quite as good this week as it was 5 days ago but about the same size 2/3; continues with copious amounts of malodorous drainage per  our intake nurse. The PCR culture I did 2 weeks ago showed E. coli and low quantities of Enterococcus. There were multiple resistance genes detected. I put Neosporin on him last week although this does not seem to have helped. The wound is slightly deeper today. Offloading continues to be an issue here although with the amount of drainage she has  a total contact cast is just not going to work 2/10; moderate amount of drainage. Patient reports he cannot get his stocking on over the dressing. I told him we have to do that the nurse gave him suggestions on how to make this work. The wound is on the bottom and lateral part of his left foot. Is cultured predominantly grew low amounts of Enterococcus, E. coli and anaerobes. There were multiple resistance genes detected including extended spectrum beta lactamase, quinolone, tetracycline. I could not think of an easy oral combination to address this so for now I am going to do topical antibiotics provided by [facility] I think the main agents here are vancomycin and an aminoglycoside. We have to be able to give him access to the wounds to get the topical antibiotic on 2/17; moderate amount of drainage this is unchanged. He has his Keystone topical antibiotic against the deep tissue culture organisms. He has been using this and changing the dressing daily. Silver alginate on the wound surface. 2/24; using Keystone antibiotic with silver alginate on the top. He had too much drainage for a total contact cast at one point although I think that is improving and I think in the next week or 2 it might be possible to replace a total contact cast I did not do this today. In general the wound surface looks healthy however he continues to have thick rims of skin and subcutaneous tissue around the wide area of the circumference which I debrided 06/04/2020 upon evaluation today patient appears to be doing well in regard to his wound. I do feel like he is showing signs of  improvement. There is little bit of callus and dead tissue around the edges of the wound as well as what appears to be a little bit of a sinus tract that is off to the side laterally I would perform debridement to clear that away today. 3/17; left lateral foot. The wound looks about the same as I remember. Not much depth surface looks healthy. No evidence of infection 3/25; left lateral foot. Wound surface looks about the same. Separating epithelium from the circumference. There really is no evidence of infection here however not making progress by my view 3/29; left lateral foot. Surface of the wound again looks reasonably healthy still thick skin and subcutaneous tissue around the wound margins. There is no evidence of infection. One of the concerns being brought up by the nurses has again the amount of drainage vis--vis continued use of a total contact cast 4/5; left lateral foot at roughly the base of the fifth metatarsal. Nice healthy looking granulated tissue with rims of epithelialization. The overall wound measurements are not any better but the tissue looks healthy. The only concern is the amount of drainage although he has no surrounding maceration with what we have been doing recently to absorb fluid and protect his skin. He also has lymphedema. 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 He states that the kerlix/coban under the TCC rolled down and could not get it back up. He has been using an offloading scooter and has somehow hurt his right foot using this device. This happened last week. He states that the side of his right foot developed a  blister and opened. The top of his foot also has a few small open wounds he thinks is due to his  socks rubbing in his shoes. He has not been using any dressings to the wound. He denies purulent drainage, fever/chills or erythema to the wounds. 4/22; patient presents for 1 week follow-up. He developed new wounds to the right foot that were evaluated at last clinic visit. He continues to have a total contact cast to the left leg and he reports no issues. He has been using silver collagen to the right foot wounds with no issues. He denies purulent drainage, fever/chills or erythema to the right foot wounds. He has no complaints today 4/25; patient presents for 1 week follow-up. He has a total contact cast of the left leg and reports no issues. He has been using silver alginate to the right foot wound. He denies purulent drainage, fever/chills or erythema to the right foot wounds. 5/2 patient presents for 1 week follow-up. T contact cast on the left. The wound which is on the base of the plantar foot at the base of the fifth metatarsal otal actually looks quite good and dimensions continue to gradually contract. HOWEVER the area on the right lateral fifth metatarsal head is much larger than what I remember from 2 weeks ago. Once more is he has significant levels of hypergranulation. Noteworthy that he had this same hyper granulated response on his wound on the left foot at one point in time. So much so that he I thought there was an underlying fluid collection. Based on this I think this just needs debridement. 5/9; the wound on the left actually continues to be gradually smaller with a healthy surface. Slight amount of drainage and maceration of the skin around but not too bad. However he has a large wound over the right fifth metatarsal head very much in the same configuration as his left foot wound was initially. I used silver nitrate to address the hyper granulated tissue no mechanical debridement 5/16; area on the left foot did not look as healthy this week deeper thick surrounding macerated  skin and subcutaneous tissue. oo The area on the right foot fifth met head was about the same oo The area on the right ankle that we identified last week is completely broken down into an open wound presumably a stocking rubbing issue 5/23; patient has been using a total contact cast to the left side. He has been using silver alginate underneath. He has also been using silver alginate to the right foot wounds. He has no complaints today. He denies any signs of infection. 5/31; the left-sided wound looks some better measure smaller surface granulation looks better. We have been using silver alginate under the total contact cast oo The large area on his right fifth met head and right dorsal foot look about the same still using silver alginate 6/6; neither side is good as I was hoping although the surface area dimensions are better. A lot of maceration on his left and right foot around the wound edge. Area on the dorsal right foot looks better. He says he was traveling. I am not sure what does the amount of maceration around the plantar wounds may be drainage issues 6/13; in general the wound surfaces look quite good on both sides. Macerated skin and raised edges around the wound required debridement although in general especially on the left the surface area seems improved. oo The area on the right dorsal ankle is about the  same I thought this would not be such a problem to close 6/20; not much change in either wound although the one on the right looks a little better. Both wounds have thick macerated edges to the skin requiring debridements. We have been using silver alginate. The area on the dorsal right ankle is still open I thought this would be closed. 6/28; patient comes in today with a marked deterioration in the right foot wound fifth met head. Wide area of exposed bone this is a drastic change from last time. The area on the left there we have been casting is stagnant. We have been using  silver alginate in both wound areas. 7/5; bone culture I did for PCR last time was positive for Pseudomonas, group B strep, Enterococcus and Staph aureus. There was no suggestion of methicillin resistance or ampicillin resistant genes. This was resistant to tetracycline however He comes into the clinic today with the area over his right plantar fifth metatarsal head which had been doing so well 2 weeks ago completely necrotic feeling bone. I do not know that this is going to be salvageable. The left foot wound is certainly no smaller but it has a better surface and is superficial. 7/8; patient called in this morning to say that his total contact cast was rubbing against his foot. He states he is doing fine overall. He denies signs of infection. 7/12; continued deterioration in the wound over the right fifth metatarsal head crumbling bone. This is not going to be salvageable. The patient agrees and wants to be referred to Dr. Doran Durand which we will attempt to arrange as soon as possible. I am going to continue him on antibiotics as long as that takes so I will renew those today. The area on the left foot which is the base of the fifth metatarsal continues to look somewhat better. Healthy looking tissue no depth no debridement is necessary here. 7/20; the patient was kindly seen by Dr. Doran Durand of orthopedics on 10/19/2020. 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 Towards this end we have taken him out of the total contact cast on the left we will put him in a wrap with Hydrofera Blue. As I understand things surgery is planned for 7/21 7/27; patient had his surgery last Thursday. 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 Apparently everything went well we did not still disturb that today The area on the left foot actually looks quite good. He has been much less mobile which probably explains this he did not seem  to do well in the total contact cast secondary to drainage and maceration I think. 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 Fortunately there is no evidence of active infection at this time. No fevers, chills, nausea, vomiting, or diarrhea. Overall I think that he is actually doing extremely well. Nonetheless I do believe that he is staying off of this more following the surgery in his right foot that is the reason the left is doing so great. 8/16; left plantar foot wound. This looks smaller than the last time I saw this he is using Hydrofera Blue. The surgical wound on the right foot is being followed by Dr. Doran Durand we did not look at this today. He has surgical shoes on both  feet 8/23; left plantar foot wound not as good this week. Surrounding macerated skin and subcutaneous tissue everything looks moist and wet. I do not think he is offloading this adequately. He is using a surgical shoe Apparently the right foot surgical wound is not open although I did not check his foot 8/31; left plantar foot lateral aspect. Much improved this week. He has no maceration. Some improvement in the surface area of the wound but most impressively the depth is come in we are using silver alginate. The patient is a Product/process development scientist. He is asked that we write him a letter so he can go back to work. I have also tried to see if we can write something that will allow him to limit the amount of time that he is on his foot at work. 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 Hopefully the latter can be avoided 9/6; I believe the patient missed an appointment last week. He arrives in today with a wound looking roughly the same certainly no better. Undermining laterally and also inferiorly. We used molecuLight today in training  with the patient's permission.. We are using silver alginate 9/21 wound is measuring bigger this week although this may have to do with the aggressive circumferential debridement last week in response to the blush fluorescence on the MolecuLight. Culture I did last week showed significant MSSA and E. coli. I put him on Augmentin but he has not started it yet. We are also going to send this for compounded antibiotics at [facility]. There is no evidence of systemic infection 9/29; silver alginate. His Keystone arrived. He is completing Augmentin in 2 days. Offloading in a cam boot. Moderate drainage per our intake staff 10/5; using silver alginate. He has been using his [place]. He has completed his Augmentin. Per our intake nurse still a lot of drainage, far too much to consider a total contact cast. Wound measures about the same. He had the same undermining area that I defined last week from a roughly 11-3. I remove this today 10/12; using silver alginate he is using the [place]. He comes in for a nurse visit hence we are applying [employer] twice a week. Measuring slightly better today and less notable drainage. Extensive debridement of the wound edge last time 10/18; using topical Keystone and silver alginate and a soft cast. Wound measurements about the same. Drainage was through his soft cast. We are changing this twice a week Tuesdays and Friday 10/25; comes in with moderate drainage. Still using Keystone silver alginate and a soft cast. Wound dimensions completely the same.He has a lot of edema in the left leg he has lymphedema. Asking for [name] to consider wrapping him as he cannot get his stocking on over the soft cast 11/2; comes in with moderate to large drainage slightly smaller in terms of width we have been using Wall. His wound looks satisfactory but not much improvement 11/4; patient presents today for obligatory cast change. Has no issues or complaints today. He denies signs of  infection. 11/9; patient traveled this weekend to [place], was on the cast quite a bit. Staining of the cast with black material from his walking boot. Drainage was not quite as bad as we feared. Using silver alginate and Keystone 11/16; we do not have size for cast therefore we have been putting a soft cast on him since the change on Friday. Still a  significant amount of drainage necessitating changing twice a week. We have been using the Keystone at cast changes either hard or soft as well as silver alginate Comes in the clinic with things actually looking fairly good improvement in width. He says his offloading is about the same 02/24/2021 upon evaluation today patient actually comes back in and is doing excellent in regard to his foot ulcer this is significantly smaller even compared to the last visit. The soft cast seems to have done extremely well for him which is great news. I do not see any signs of infection minimal debridement will be needed today. 11/30; left lateral foot much improved half a centimeter improvement in surface area. No evidence of infection. He seems to be doing better with the soft cast in the TCC therefore we will continue with this. He comes back in later in the week for a change with the nurses. This is due to drainage 12/6; no improvement in dimensions. Under illumination some debris on the surface we have been using silver alginate, soft cast. If there is anything optimistic here he seems to have have less drainage 12/13. Dimensions are improved both length and width and slightly in depth. Appears to be quite healthy today. Raised edges of this thick skin and callus around the edges however. He is in a soft cast were bringing him back once for a change on Friday. Drainage is better 12/20. Dimensions are improved. He still has raised edges of thick skin and callus around the edges. We are using a soft cast 12/28; comes in today with thick callus around the wound. Using  silver under alginate under a soft cast. I do not think there is much improvement in any measurement 2023 04/06/2021; patient was put in a total contact cast. Unfortunately not much change in surface area 1/10; not much different still thick callus and skin around the edge in spite of the total contact cast. This was just debrided last week we have been using the [facility] compounded antibiotic and silver alginate under a total contact cast 1/18 the patient's wound on the left side is doing nicely. smaller HOWEVER he comes in today with a wound on the right foot laterally. blister most likely serosangquenous drainage 1/24; the patient continues to do well in terms of the plantar left foot which is continued to contract using silver alginate under the total contact cast HOWEVER the right lateral foot is bigger with denuded skin around the edges. I used pickups and a #15 scalpel to remove this this looks like the remanence of a large blister. Cannot rule out infection. Culture in this area I did last week showed Staphylococcus lugdunensis few colonies. I am going to try to address this with his [employer] antibiotic that is done so well on the left having linezolid and this should cover the staph 2/1; the patient's wound on his left foot which was the original plantar foot wound thick skin and eschar around the edges even in the total contact cast but the wound surface does not look too bad The real problem is on how his right lateral foot at roughly the base of the fifth metatarsal. The wound is completely necrotic more worrisome than that there is swelling around the edges of this. We have been using silver alginate on both wounds and Keystone on the right foot. Unfortunately I think he is going to require systemic antibiotics while we await cultures. He did not get the x-ray done that we ordered last  week [lost the prescription 2/7; disappointingly in the area on the left foot which we are treating with  a total contact cast is still not closed although it is much smaller. He continues to have a lot of callus around the wound edge. -Right lateral foot culture I did last week was negative x-ray also negative for osteomyelitis. 2/15: TCC silver alginate on the left and silver alginate on the right lateral. No real improvement in either area 05/26/2021: T oday, the wounds are roughly the same size as at his previous visit, post-debridement. He continues to endorse fairly substantial drainage, particularly on the right. He has been in a total contact cast on the left. There is still some callus surrounding this lesion. On the right, the periwound skin is quite macerated, along with surrounding callus. The center of the right-sided wound also has some dark, densely adherent material, which is very difficult to remove. 06/02/2021: Today, both wounds are slightly smaller. He has been using zinc oxide ointment around the right ulcer and the degree of maceration has improved markedly. There continues to be an area of nonviable tissue in the center of the right sided ulcer. The left-sided wound, which has been in the total contact cast. Appears clean and the degree of callus around it is less than previously. 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 He had to take the stairs and both wounds have increased in size. The left foot, which has been in a total contact cast, has developed a tunnel tracking to the lateral aspect of his foot. The nonviable tissue in the center of the right-sided ulcer remains recalcitrant to debridement. There is significant undermining surrounding the entirety of the left sided wound. 06/16/2021: The elevator at school has been fixed and the patient has been able to avoid putting as much weight on his wounds over the past week. We converted the left foot wound into a single lesion today, but despite this, the wound is  actually smaller. The base is healthy with limited periwound callus. On the right, the central necrotic area is still present. He continues to be quite macerated around the right sided wound, despite applying barrier cream. This does, however, have the benefit of softening the callus to make it more easily removable. 06/23/2021: Today, the left wound is smaller. The lateral aspect that had opened up previously is now closed. The wound base has a healthy bed of granulation tissue and minimal slough. Unfortunately, on the right, the wound is larger and continues to be fairly macerated. He has also reopened the wound at his right ankle. He thinks this is due to the gait he has adopted secondary to his total contact cast and boot. 06/30/2021: T oday, both wounds are a little bit larger. The lateral aspect on the left has remained closed. He continues to have significant periwound maceration. The culture that I took from the right sided wound grew a population of bacteria that is not covered by his current [facility] antibiotic. The center of the right- sided wound continues to appear necrotic with nonviable fat. It probes deeper today, but does not reach bone. 07/07/2021: The periwound maceration is a little bit less today. The right lateral foot wound has some areas that appear more viable and the necrotic center also looks a little bit better. The wound on the dorsal surface of his right foot near the ankle is contracting and the surface appears healthy. The left  plantar wound surface looks healthy, but there is some new undermining on the medial portion. He did get his new Keystone antibiotic and began applying that to the right foot wound on Saturday. 07/14/2021: The intake nurse reported substantial drainage from his wounds, but the periwound skin actually looks better than is typical for him. The wound on the dorsal surface of his right foot near the ankle is smaller and just has a small open area  underneath some dried eschar. The left plantar wound surface looks healthy and there has been no significant accumulation of callus. The right lateral foot wound looks quite a bit better, with the central portion, which typically appears necrotic, looking more viable albeit pale. 07/22/2021: His left foot is extremely macerated today. The wound is about the same size. The wound on the dorsal surface of his right foot near the ankle had closed, but he traumatized it removing the dressing and there is a tiny skin tear in that location. The right lateral foot wound is bigger, but the surface appears healthy. 07/30/2021: The wound on the dorsal surface of his right foot near the ankle is closed. The right lateral foot wound again is a little bit bigger due to some undermining. The periwound skin is in better condition, however. He has been applying zinc oxide. The wound surface is a little bit dry today. On the left, he does not have the substantial maceration that we frequently see. The wound itself is smaller and has a clean surface. 08/06/2021: Both wounds seem to have deteriorated over the past week. The right lateral foot wound has a dry surface but the periwound is boggy.. Overall wound dimensions are about the same. On the left, the wound is about the same size, but there is more undermining present underneath periwound callus. 08/13/2021: The right sided wound looks about the same, but on the left there has been substantial deterioration. The undermining continues to extend under periwound callus. Once this was removed, substantial extension of the wound was present. There is no odor or purulent drainage but clearly the wounds have broken down. 08/20/2021: The wounds look about the same today. He has been out of his total contact cast and has just been changing the dressings using topical Keystone with PolyMem Ag, Kerlix and Ace bandages. The wound on the top of his right ankle has reopened but this is  quite small. There was a little bit of purulent material that I expressed when examining this wound. 08/24/2021: After the aggressive debridement I performed at his last visit, the wounds actually look a little bit better today. They are smaller with the exception of the wound on the top of his right ankle which is a little bit bigger as some more skin pulled off when he was changing his dressing. We are using topical Keystone with PolyMem Ag Kerlix and Ace bandages. Patient History Information obtained from Patient. Family History No family history of Cancer, Diabetes, Hereditary Spherocytosis, Hypertension, Kidney Disease, Lung Disease, Seizures, Stroke, Thyroid Problems, Tuberculosis. Social History Never smoker, Marital Status - Single, Alcohol Use - Rarely, Drug Use - No History, Caffeine Use - Never. Medical History Eyes Denies history of Cataracts, Glaucoma, Optic Neuritis Ear/Nose/Mouth/Throat Denies history of Chronic sinus problems/congestion, Middle ear problems Hematologic/Lymphatic Denies history of Anemia, Hemophilia, Human Immunodeficiency Virus, Lymphedema, Sickle Cell Disease Respiratory Denies history of Aspiration, Asthma, Chronic Obstructive Pulmonary Disease (COPD), Pneumothorax, Sleep Apnea, Tuberculosis Cardiovascular Denies history of Angina, Arrhythmia, Congestive Heart Failure, Coronary Artery Disease, Deep  Vein Thrombosis, Hypertension, Hypotension, Myocardial Infarction, Peripheral Arterial Disease, Peripheral Venous Disease, Phlebitis, Vasculitis Gastrointestinal Denies history of Cirrhosis , Colitis, Crohnoos, Hepatitis A, Hepatitis B, Hepatitis C Endocrine Patient has history of Type II Diabetes Denies history of Type I Diabetes Immunological Denies history of Lupus Erythematosus, Raynaudoos, Scleroderma Integumentary (Skin) Denies history of History of Burn Musculoskeletal Denies history of Gout, Rheumatoid Arthritis, Osteoarthritis,  Osteomyelitis Neurologic Denies history of Dementia, Neuropathy, Quadriplegia, Paraplegia, Seizure Disorder Oncologic Denies history of Received Chemotherapy, Received Radiation Psychiatric Denies history of Anorexia/bulimia, Confinement Anxiety Hospitalization/Surgery History - 11/1-11/06/2018- sepsis foot infection. - 11/4-11/5 02 sats low respiratory distress. Objective Constitutional No acute distress. Vitals Time Taken: 3:50 PM, Height: 77 in, Weight: 280 lbs, BMI: 33.2, Temperature: 98.5 F, Pulse: 90 bpm, Respiratory Rate: 18 breaths/min, Blood Pressure: 136/83 mmHg, Capillary Blood Glucose: 105 mg/dl. Respiratory Normal work of breathing on room air. General Notes: 08/24/2021: The wounds on his plantar foot surfaces are both a little bit smaller today. There is still some undermining on the left ulcer, but the surfaces of both sites are clean. There is still necrotic fat in the middle of the left ulcer. The wound on the top of his foot near his ankle has just some slough and eschar accumulation. Integumentary (Hair, Skin) Wound #11 status is Open. Original cause of wound was Gradually Appeared. The date acquired was: 08/20/2021. The wound is located on the [place]. The wound measures 0.7cm length x 1.6cm width x 0.1cm depth; 0.88cm^2 area and 0.088cm^3 volume. There is Fat Layer (Subcutaneous Tissue) exposed. There is a medium amount of purulent drainage noted. The wound margin is distinct with the outline attached to the wound base. There is large (67- 100%) red granulation within the wound bed. There is no necrotic tissue within the wound bed. Wound #3 status is Open. Original cause of wound was Trauma. The date acquired was: 10/02/2019. The wound has been in treatment 95 weeks. The wound is located on the [place]. The wound measures 3.5cm length x 4.8cm width x 0.5cm depth; 13.195cm^2 area and 6.597cm^3 volume. There is Fat Layer (Subcutaneous Tissue)  exposed. There is a medium amount of serosanguineous drainage noted. The wound margin is well defined and not attached to the wound base. There is large (67-100%) red, pink granulation within the wound bed. There is a small (1-33%) amount of necrotic tissue within the wound bed including Adherent Slough. Wound #8 status is Open. Original cause of wound was Blister. The date acquired was: 04/18/2021. The wound has been in treatment 17 weeks. The wound is located on the Right,Lateral Foot. The wound measures 3.6cm length x 3.7cm width x 0.8cm depth; 10.462cm^2 area and 8.369cm^3 volume. There is Fat Layer (Subcutaneous Tissue) exposed. There is a large amount of serosanguineous drainage noted. The wound margin is well defined and not attached to the wound base. There is large (67-100%) red, pink granulation within the wound bed. There is a small (1-33%) amount of necrotic tissue within the wound bed including Adherent Slough. 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 There was a Selective/Open Wound  Non-Viable Tissue Debridement with a total area of 1.12 sq cm performed by [name], MD. With the following instrument(s): Curette to remove Non-Viable tissue/material. Material removed includes Eschar and Slough and. No specimens were taken. A time out was conducted at 16:40, prior to the start of the procedure. A Minimum amount of bleeding was controlled with Pressure. The procedure was tolerated well with a pain level of 0  throughout and a pain level of 0 following the procedure. Post Debridement Measurements: 0.7cm length x 1.6cm width x 0.1cm depth; 0.088cm^3 volume. Character of Wound/Ulcer Post Debridement is improved. Severity of Tissue Post Debridement is: Fat layer exposed. Post procedure Diagnosis Wound #11: Same as Pre-Procedure Wound #3 Pre-procedure diagnosis of Wound #3 is a Diabetic Wound/Ulcer of the Lower Extremity located on the Left,Lateral,Plantar Foot .Severity of Tissue Pre Debridement is: Fat layer exposed. There was a Selective/Open Wound Non-Viable Tissue Debridement with a total area of 1 sq cm performed by [name], MD. With the following instrument(s): Curette to remove Non-Viable tissue/material. Material removed includes Callus. No specimens were taken. A time out was conducted at 16:40, prior to the start of the procedure. A Minimum amount of bleeding was controlled with Pressure. The procedure was tolerated well with a pain level of 0 throughout and a pain level of 0 following the procedure. Post Debridement Measurements: 3.5cm length x 4.8cm width x 0.5cm depth; 6.597cm^3 volume. Character of Wound/Ulcer Post Debridement is improved. Severity of Tissue Post Debridement is: Fat layer exposed. Post procedure Diagnosis Wound #3: Same as Pre-Procedure Wound #8 Pre-procedure diagnosis of Wound #8 is a Diabetic Wound/Ulcer of the Lower Extremity located on the Right,Lateral Foot .Severity of Tissue Pre Debridement is: Necrosis of muscle. There was a Excisional Skin/Subcutaneous Tissue Debridement with a total area of 13.32 sq cm performed by [name], MD. With the following instrument(s): Curette to remove Non-Viable tissue/material. Material removed includes Fat, Callus, and Slough. No specimens were taken. A time out was conducted at 16:40, prior to the start of the procedure. A Moderate amount of bleeding was controlled with Silver Nitrate. The procedure was tolerated well  with a pain level of 0 throughout and a pain level of 0 following the procedure. Post Debridement Measurements: 3.6cm length x 3.7cm width x 0.8cm depth; 8.369cm^3 volume. Character of Wound/Ulcer Post Debridement is improved. Severity of Tissue Post Debridement is: Fat layer exposed. Post procedure Diagnosis Wound #8: Same as Pre-Procedure Plan Follow-up Appointments: Return Appointment in 1 week. - Dr. [name] - Room 4 - Thursday 6/1 at 3:15 Bathing/ Shower/ Hygiene: May shower with protection but do not get wound dressing(s) wet. - Use Cast Protector Bag on Right and left legs Edema Control - Lymphedema / SCD / Other: Elevate legs to the level of the heart or above for 30 minutes daily and/or when sitting, a frequency of: - throughout the day Avoid standing for long periods of time. Exercise regularly Compression stocking or Garment 20-30 mm/Hg pressure to: - to right leg daily Off-Loading: Open toe surgical shoe to: - right foot Other: - Soft cast to left foot Additional Orders / Instructions: Follow Nutritious Diet WOUND #11: - Foot Wound Laterality: Right, Anterior Cleanser: Soap and Water 1 x Per EHO/12 Days Discharge Instructions: May shower and wash wound with dial antibacterial soap and water prior to dressing change. Cleanser: Byram Ancillary Kit - 15 Day Supply (Generic) 1 x Per Day/15 Days Discharge Instructions: Use supplies as instructed; Kit contains: (15) Saline Bullets; (15) 3x3 Gauze;  15 pr Gloves Topical: keystone antibiotic compound 1 x Per Day/15 Days Prim Dressing: PolyMem Silver Non-Adhesive Dressing, 4.25x4.25 in (Generic) 1 x Per Day/15 Days ary Discharge Instructions: Apply to wound bed as instructed Secondary Dressing: ABD Pad, 8x10 (Generic) 1 x Per Day/15 Days Discharge Instructions: Apply over primary dressing as directed. Secondary Dressing: Optifoam Non-Adhesive Dressing, 4x4 in (Generic) 1 x Per Day/15 Days Discharge Instructions: Apply over primary  dressing as directed. Secondary Dressing: Zetuvit Plus 4x8 in 1 x Per Day/15 Days Discharge Instructions: Apply over primary dressing as directed. Secondary Dressing: NonWoven Sponge, 4x4 (in/in) (Generic) 1 x Per Day/15 Days Secured With: Coban Self-Adherent Wrap 4x5 (in/yd) (Generic) 1 x Per Day/15 Days Discharge Instructions: Secure with Coban as directed. Secured With: [employer], 4.5x3.1 (in/yd) (Generic) 1 x Per Day/15 Days Discharge Instructions: Secure with Kerlix as directed. Secured With: 55M Medipore H Soft Cloth Surgical T ape, 4 x 10 (in/yd) (Generic) 1 x Per Day/15 Days Discharge Instructions: Secure with tape as directed. WOUND #3: - Foot Wound Laterality: Plantar, Left, Lateral Cleanser: Soap and Water 1 x Per Day/15 Days Discharge Instructions: May shower and wash wound with dial antibacterial soap and water prior to dressing change. Cleanser: Byram Ancillary Kit - 15 Day Supply (Generic) 1 x Per Day/15 Days Discharge Instructions: Use supplies as instructed; Kit contains: (15) Saline Bullets; (15) 3x3 Gauze; 15 pr Gloves Topical: keystone antibiotic compound 1 x Per Day/15 Days Prim Dressing: PolyMem Silver Non-Adhesive Dressing, 4.25x4.25 in (Generic) 1 x Per Day/15 Days ary Discharge Instructions: Apply to wound bed as instructed Secondary Dressing: ABD Pad, 8x10 (Generic) 1 x Per Day/15 Days Discharge Instructions: Apply over primary dressing as directed. Secondary Dressing: Optifoam Non-Adhesive Dressing, 4x4 in (Generic) 1 x Per Day/15 Days Discharge Instructions: Apply over primary dressing as directed. Secondary Dressing: Zetuvit Plus 4x8 in 1 x Per Day/15 Days Discharge Instructions: Apply over primary dressing as directed. Secondary Dressing: NonWoven Sponge, 4x4 (in/in) (Generic) 1 x Per Day/15 Days Secured With: Coban Self-Adherent Wrap 4x5 (in/yd) (Generic) 1 x Per Day/15 Days Discharge Instructions: Secure with Coban as directed. Secured With: [employer], 4.5x3.1 (in/yd) (Generic) 1 x Per Day/15 Days Discharge Instructions: Secure with Kerlix as directed. Secured With: 55M Medipore H Soft Cloth Surgical T ape, 4 x 10 (in/yd) (Generic) 1 x Per Day/15 Days Discharge Instructions: Secure with tape as directed. WOUND #8: - Foot Wound Laterality: Right, Lateral Cleanser: Soap and Water 1 x Per MWU/13 Days Discharge Instructions: May shower and wash wound with dial antibacterial soap and water prior to dressing change. Cleanser: Byram Ancillary Kit - 15 Day Supply (Generic) 1 x Per Day/15 Days Discharge Instructions: Use supplies as instructed; Kit contains: (15) Saline Bullets; (15) 3x3 Gauze; 15 pr Gloves Topical: keystone antibiotic compound 1 x Per Day/15 Days Prim Dressing: PolyMem Silver Non-Adhesive Dressing, 4.25x4.25 in (Generic) 1 x Per Day/15 Days ary Discharge Instructions: Apply to wound bed as instructed Secondary Dressing: ABD Pad, 8x10 (Generic) 1 x Per Day/15 Days Discharge Instructions: Apply over primary dressing as directed. Secondary Dressing: Optifoam Non-Adhesive Dressing, 4x4 in (Generic) 1 x Per Day/15 Days Discharge Instructions: Apply over primary dressing as directed. Secondary Dressing: Zetuvit Plus 4x8 in 1 x Per Day/15 Days Discharge Instructions: Apply over primary dressing as directed. Secondary Dressing: NonWoven Sponge, 4x4 (in/in) (Generic) 1 x Per Day/15 Days Secured With: Coban Self-Adherent Wrap 4x5 (in/yd) (Generic) 1 x Per Day/15 Days Discharge Instructions: Secure with Coban as directed. Secured  With: Kerlix Roll Sterile, 4.5x3.1 (in/yd) (Generic) 1 x Per Day/15 Days Discharge Instructions: Secure with Kerlix as directed. Secured With: 29M Medipore H Soft Cloth Surgical T ape, 4 x 10 (in/yd) (Generic) 1 x Per Day/15 Days Discharge Instructions: Secure with tape as directed. 08/24/2021: The wounds on his plantar foot surfaces are both a little bit smaller today. There is still some undermining on  the left ulcer, but the surfaces of both sites are clean. There is still necrotic fat in the middle of the right ulcer. The wound on the top of his foot near his ankle has just some slough and eschar accumulation. I used a curette to debride the tissue creating the undermining on the left foot ulcer and the necrotic fat from the middle of the right ulcer. I also used a curette to debride the slough and eschar from the wound at the top of his right foot near his ankle. I think the aggressive debridement I performed on Friday was beneficial. We will continue using his topical [place] compound with PolyMem Ag. Follow-up in 1 week. Electronic Signature(s) Signed: 08/24/2021 5:23:17 PM By: [name] MD FACS Entered By: [name] on 08/24/2021 17:23:17 -------------------------------------------------------------------------------- [facility] Details Patient Name: Date of Service: [name], [name] 08/24/2021 3:15 PM Medical Record Number: [id number] Patient Account Number: [id number] Date of Birth/Sex: Treating RN: [birth year] (36 y.o. [name] Primary Care Provider: [name] Other Clinician: Referring Provider: Treating Provider/Extender: [name] in Treatment: 95 Information Obtained From Patient Eyes Medical History: Negative for: Cataracts; Glaucoma; Optic Neuritis Ear/Nose/Mouth/Throat Medical History: Negative for: Chronic sinus problems/congestion; Middle ear problems Hematologic/Lymphatic Medical History: Negative for: Anemia; Hemophilia; Human Immunodeficiency Virus; Lymphedema; Sickle Cell Disease Respiratory Medical History: Negative for: Aspiration; Asthma; Chronic Obstructive Pulmonary Disease (COPD); Pneumothorax; Sleep Apnea; Tuberculosis Cardiovascular Medical History: Negative for: Angina; Arrhythmia; Congestive Heart Failure; Coronary Artery Disease; Deep Vein Thrombosis; Hypertension; Hypotension;  Myocardial Infarction; Peripheral Arterial Disease; Peripheral Venous Disease; Phlebitis; Vasculitis Gastrointestinal Medical History: Negative for: Cirrhosis ; Colitis; Crohns; Hepatitis A; Hepatitis B; Hepatitis C Endocrine Medical History: Positive for: Type II Diabetes Negative for: Type I Diabetes Time with diabetes: 8 Treated with: Insulin Blood sugar tested every day: No Immunological Medical History: Negative for: Lupus Erythematosus; Raynauds; Scleroderma Integumentary (Skin) Medical History: Negative for: History of Burn Musculoskeletal Medical History: Negative for: Gout; Rheumatoid Arthritis; Osteoarthritis; Osteomyelitis Neurologic Medical History: Negative for: Dementia; Neuropathy; Quadriplegia; Paraplegia; Seizure Disorder Oncologic Medical History: Negative for: Received Chemotherapy; Received Radiation Psychiatric Medical History: Negative for: Anorexia/bulimia; Confinement Anxiety Immunizations Pneumococcal Vaccine: Received Pneumococcal Vaccination: No Implantable Devices None Hospitalization / Surgery History Type of Hospitalization/Surgery 11/1-11/06/2018- sepsis foot infection 11/4-11/5 02 sats low respiratory distress Family and Social History Cancer: No; Diabetes: No; Hereditary Spherocytosis: No; Hypertension: No; Kidney Disease: No; Lung Disease: No; Seizures: No; Stroke: No; Thyroid Problems: No; Tuberculosis: No; Never smoker; Marital Status - Single; Alcohol Use: Rarely; Drug Use: No History; Caffeine Use: Never; Financial Concerns: No; Food, Clothing or Shelter Needs: No; Support System Lacking: No; Transportation Concerns: No Electronic Signature(s) Signed: 08/24/2021 5:26:11 PM By: [name] MD FACS Signed: 08/24/2021 5:44:44 PM By: Sabas Sous By: [name] on 08/24/2021 17:20:46 -------------------------------------------------------------------------------- [place] Details Patient Name: Date of  Service: [name], [name] 08/24/2021 Medical Record Number: [id number] Patient Account Number: [id number] Date of Birth/Sex: Treating RN: 1985-11-18 (36 y.o. 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 Facility Procedures CPT4 Code: [id number] Description: 46568 - DEB SUBQ TISSUE 20 SQ CM/< ICD-10 Diagnosis Description L97.528 Non-pressure chronic ulcer of other part of left foot with other specified severi L97.518 Non-pressure chronic ulcer of other part of right foot with other specified  sever Modifier: ty ity Quantity: 1 CPT4 Code: 12751700 Description: 17494 - DEBRIDE WOUND 1ST 20 SQ CM OR < ICD-10 Diagnosis Description 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 Modifier: Quantity: 1 Physician Procedures : CPT4 Code Description Modifier [id number] 63846 - WC PHYS LEVEL 3 - EST PT 25 ICD-10 Diagnosis Description L97.528 Non-pressure chronic ulcer of other part of left foot with other specified severity L97.518 Non-pressure chronic ulcer of other part of  right foot with other specified severity 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.621 Type 2 diabetes mellitus with foot ulcer Quantity: 1 : [id number] 01779 - WC PHYS SUBQ TISS 20 SQ CM ICD-10 Diagnosis Description L97.528 Non-pressure chronic ulcer of other part of left foot with other specified severity L97.518 Non-pressure chronic ulcer of other part of right foot with  other specified  severity Quantity: 1 : [id number] 23300 - WC PHYS DEBR WO ANESTH 20 SQ CM ICD-10 Diagnosis Description 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

## 2021-09-01 ENCOUNTER — Other Ambulatory Visit (HOSPITAL_COMMUNITY): Payer: Self-pay | Admitting: General Surgery

## 2021-09-01 DIAGNOSIS — E08621 Diabetes mellitus due to underlying condition with foot ulcer: Secondary | ICD-10-CM

## 2021-09-02 ENCOUNTER — Encounter (HOSPITAL_BASED_OUTPATIENT_CLINIC_OR_DEPARTMENT_OTHER): Payer: BC Managed Care – PPO | Attending: General Surgery | Admitting: General Surgery

## 2021-09-02 ENCOUNTER — Ambulatory Visit (HOSPITAL_COMMUNITY)
Admission: RE | Admit: 2021-09-02 | Discharge: 2021-09-02 | Disposition: A | Discharge status: Home / Self Care | Payer: BC Managed Care – PPO | Source: Ambulatory Visit | Attending: General Surgery | Admitting: General Surgery

## 2021-09-02 DIAGNOSIS — E11621 Type 2 diabetes mellitus with foot ulcer: Secondary | ICD-10-CM | POA: Insufficient documentation

## 2021-09-02 DIAGNOSIS — L97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: Secondary | ICD-10-CM | POA: Diagnosis not present

## 2021-09-02 DIAGNOSIS — L97528 Non-pressure chronic ulcer of other part of left foot with other specified severity: Secondary | ICD-10-CM | POA: Insufficient documentation

## 2021-09-02 DIAGNOSIS — E1151 Type 2 diabetes mellitus with diabetic peripheral angiopathy without gangrene: Secondary | ICD-10-CM | POA: Diagnosis not present

## 2021-09-02 DIAGNOSIS — E08621 Diabetes mellitus due to underlying condition with foot ulcer: Secondary | ICD-10-CM | POA: Insufficient documentation

## 2021-09-02 DIAGNOSIS — I890 Lymphedema, not elsewhere classified: Secondary | ICD-10-CM | POA: Insufficient documentation

## 2021-09-02 DIAGNOSIS — M659 Synovitis and tenosynovitis, unspecified: Secondary | ICD-10-CM | POA: Diagnosis not present

## 2021-09-02 DIAGNOSIS — L97518 Non-pressure chronic ulcer of other part of right foot with other specified severity: Secondary | ICD-10-CM | POA: Diagnosis not present

## 2021-09-02 DIAGNOSIS — L97411 Non-pressure chronic ulcer of right heel and midfoot limited to breakdown of skin: Secondary | ICD-10-CM | POA: Diagnosis not present

## 2021-09-02 DIAGNOSIS — L84 Corns and callosities: Secondary | ICD-10-CM | POA: Insufficient documentation

## 2021-09-02 DIAGNOSIS — L97401 Non-pressure chronic ulcer of unspecified heel and midfoot limited to breakdown of skin: Secondary | ICD-10-CM | POA: Insufficient documentation

## 2021-09-02 NOTE — Progress Notes (Signed)
[place], [name] ([id number]) Visit Report for 09/02/2021 Arrival Information Details Patient Name: Date of Service: [name] 09/02/2021 3:15 PM Medical Record Number: [id number] Patient Account Number: [id number] Date of Birth/Sex: Treating RN: 06-Feb-1986 (36 y.o. [name] Primary Care Careena Degraffenreid: [name] Other Clinician: Referring Lovette Merta: Treating Siobahn Worsley/Extender: [name] in Treatment: 68 Visit Information History Since Last Visit Added or deleted any medications: No Patient Arrived: Ambulatory Any new allergies or adverse reactions: No Arrival Time: 15:47 Had a fall or experienced change in No Accompanied By: alone activities of daily living that may affect Transfer Assistance: None risk of falls: Patient Identification Verified: Yes Signs or symptoms of abuse/neglect since last visito No Secondary Verification Process Completed: Yes Hospitalized since last visit: No Patient Requires Transmission-Based Precautions: No Implantable device outside of the clinic excluding No Patient Has Alerts: No cellular tissue based products placed in the center since last visit: Has Dressing in Place as Prescribed: Yes Pain Present Now: No Electronic Signature(s) Signed: 09/02/2021 6:12:29 PM By: [name] RN, BSN Entered By: [name] on 09/02/2021 15:47:53 -------------------------------------------------------------------------------- Encounter Discharge Information Details Patient Name: Date of Service: [name], [name] 09/02/2021 3:15 PM Medical Record Number: [id number] Patient Account Number: [id number] Date of Birth/Sex: Treating RN: [birth year] (36 y.o. [name] Primary Care Nao Linz: [name] Other Clinician: Referring Romari Gasparro: Treating Alter Moss/Extender: [name] in Treatment: 19 Encounter Discharge Information Items Post Procedure Vitals Discharge Condition:  Stable Temperature (F): 98.4 Ambulatory Status: Ambulatory Pulse (bpm): 94 Discharge Destination: Home Respiratory Rate (breaths/min): 18 Transportation: Private Auto Blood Pressure (mmHg): 149/88 Accompanied By: girlfriend Schedule Follow-up Appointment: Yes Clinical Summary of Care: Patient Declined Electronic Signature(s) Signed: 09/02/2021 6:12:29 PM By: [name] RN, BSN Entered By: [name] on 09/02/2021 18:10:23 -------------------------------------------------------------------------------- Lower Extremity Assessment Details Patient Name: Date of Service: [name], [name] 09/02/2021 3:15 PM Medical Record Number: [id number] Patient Account Number: [id number] Date of Birth/Sex: Treating RN: [birth year] (36 y.o. [name] Primary Care Benjamim Harnish: [name] Other Clinician: Referring Antwaun Buth: Treating Rolly Magri/Extender: [name] in Treatment: 97 Edema Assessment Assessed: [Left: No] [Right: No] Edema: [Left: Yes] [Right: Yes] Calf Left: Right: Point of Measurement: 48 cm From Medial Instep 49 cm 49 cm Ankle Left: Right: Point of Measurement: 11 cm From Medial Instep 27 cm 30 cm Vascular Assessment Pulses: Dorsalis Pedis Palpable: [Left:Yes] [Right:Yes] Electronic Signature(s) Signed: 09/02/2021 6:12:29 PM By: [name] RN, BSN Entered By: [name] on 09/02/2021 15:52:49 -------------------------------------------------------------------------------- Multi Wound Chart Details Patient Name: Date of Service: [name], [name] 09/02/2021 3:15 PM Medical Record Number: [id number] Patient Account Number: [id number] Date of Birth/Sex: Treating RN: [birth year] (36 y.o. [name] Primary Care Wonder Donaway: [name] Other Clinician: Referring Helmer Dull: Treating Davonna Ertl/Extender: [name] in Treatment: 97 Vital Signs Height(in): 77 Capillary Blood Glucose(mg/dl):  115 Weight(lbs): 280 Pulse(bpm): 94 Body Mass Index(BMI): 33.2 Blood Pressure(mmHg): 149/88 Temperature(F): 98.4 Respiratory Rate(breaths/min): 18 Photos: [11:Right, Anterior Foot] [3:Left, Lateral, Plantar Foot] [8:Right, Lateral Foot] Wound Location: [11:Gradually Appeared] [3:Trauma] [8:Blister] Wounding Event: [11:Diabetic Wound/Ulcer of the Lower] [3:Diabetic Wound/Ulcer of the Lower] [8:Diabetic Wound/Ulcer of the Lower] Primary Etiology: [11:Extremity Type II Diabetes] [3:Extremity Type II Diabetes] [8:Extremity Type II Diabetes] Comorbid History: [11:08/20/2021] [3:10/02/2019] [8:04/18/2021] Date Acquired: [11:1] [3:97] [8:19] Weeks of Treatment: [11:Open] [3:Open] [8:Open] Wound Status: [11:No] [3:No] [8:No] Wound Recurrence: [11:1x1.8x0.1] [3:3.8x4.1x0.4] [8:4x3.3x1.2] Measurements L x W x [name] [11:1.414] [3:12.237] [8:10.367]  A (cm) : rea [11:0.141] [3:4.895] [8:12.441] Volume (cm) : [11:-4461.30%] [3:-642.10%] [8:-1784.90%] % Reduction in A [11:rea: -4600.00%] [3:-2866.70%] [8:-11210.00%] % Reduction in Volume: [3:4] [8:3] Starting Position 1 (o'clock): [3:6] [8:5] Ending Position 1 (o'clock): [3:1.2] [8:1] Maximum Distance 1 (cm): [11:No] [3:Yes] [8:Yes] Undermining: [11:Grade 2] [3:Grade 2] [8:Grade 2] Classification: [11:Medium] [3:Medium] [8:Large] Exudate A mount: [11:Serosanguineous] [3:Serosanguineous] [8:Serosanguineous] Exudate Type: [11:red, brown] [3:red, brown] [8:red, brown] Exudate Color: [11:Distinct, outline attached] [3:Well defined, not attached] [8:Well defined, not attached] Wound Margin: [11:Large (67-100%)] [3:Large (67-100%)] [8:Large (67-100%)] Granulation A mount: [11:Red, Pink] [3:Red, Pink] [8:Red, Pink] Granulation Quality: [11:None Present (0%)] [3:Small (1-33%)] [8:Small (1-33%)] Necrotic A mount: [11:Fat Layer (Subcutaneous Tissue): Yes Fat Layer (Subcutaneous Tissue): Yes Fat Layer (Subcutaneous Tissue): Yes] Exposed  Structures: [11:Fascia: No Tendon: No Muscle: No Joint: No Bone: No Small (1-33%)] [3:Fascia: No Tendon: No Muscle: No Joint: No Bone: No Small (1-33%)] [8:Fascia: No Tendon: No Muscle: No Joint: No Bone: No Small (1-33%)] Epithelialization: [11:Debridement - Selective/Open Wound Debridement - Selective/Open Wound Debridement - Excisional] Debridement: Pre-procedure Verification/Time Out 16:16 [3:16:16] [8:16:16] Taken: [11:Necrotic/Eschar, Slough] [3:Callus, Slough] [8:Fat, Slough] Tissue Debrided: [11:Non-Viable Tissue] [3:Non-Viable Tissue] [8:Skin/Subcutaneous Tissue] Level: [11:1.8] [3:15.58] [8:13.2] Debridement A (sq cm): [11:rea Curette] [3:Blade, Curette, Forceps] [8:Curette] Instrument: [11:Minimum] [3:Minimum] [8:Minimum] Bleeding: [11:Pressure] [3:Pressure] [8:Pressure] Hemostasis A chieved: [11:0] [3:0] [8:0] Procedural Pain: [11:0] [3:0] [8:0] Post Procedural Pain: [11:Procedure was tolerated well] [3:Procedure was tolerated well] [8:Procedure was tolerated well] Debridement Treatment Response: [11:1x1.8x0.1] [3:3.8x4.1x0.4] [8:4x3.3x1.2] Post Debridement Measurements L x W x [name] [11:0.141] [3:4.895] [8:12.441] Post Debridement Volume: (cm) [11:Debridement] [3:Debridement] [8:Debridement] Treatment Notes Electronic Signature(s) Signed: 09/02/2021 4:43:22 PM By: [name] MD FACS Signed: 09/02/2021 6:12:29 PM By: [name] RN, BSN Entered By: [name] on 09/02/2021 16:43:22 -------------------------------------------------------------------------------- Multi-Disciplinary Care Plan Details Patient Name: Date of Service: [name], [name] 09/02/2021 3:15 PM Medical Record Number: [id number] Patient Account Number: [id number] Date of Birth/Sex: Treating RN: Sep 29, 1985 (36 y.o. [name] Primary Care Staisha Winiarski: [name] Other Clinician: Referring Syla Devoss: Treating Jacqueline Delapena/Extender: [name] in Treatment:  62 Multidisciplinary Care Plan reviewed with physician Active Inactive Nutrition Nursing Diagnoses: Imbalanced nutrition Potential for alteratiion in Nutrition/Potential for imbalanced nutrition Goals: Patient/caregiver agrees to and verbalizes understanding of need to use nutritional supplements and/or vitamins as prescribed Date Initiated: 10/24/2019 Date Inactivated: 04/06/2020 Target Resolution Date: 04/03/2020 Goal Status: Met Patient/caregiver will maintain therapeutic glucose control Date Initiated: 10/24/2019 Target Resolution Date: 10/01/2021 Goal Status: Active Interventions: Assess HgA1c results as ordered upon admission and as needed Assess patient nutrition upon admission and as needed per policy Provide education on elevated blood sugars and impact on wound healing Provide education on nutrition Treatment Activities: Education provided on Nutrition : 12/02/2020 Notes: 11/17/20: Glucose control ongoing issue, target date extended. 01/26/21: Glucose management continues. Wound/Skin Impairment Nursing Diagnoses: Impaired tissue integrity Knowledge deficit related to ulceration/compromised skin integrity Goals: Patient/caregiver will verbalize understanding of skin care regimen Date Initiated: 10/24/2019 Target Resolution Date: 10/01/2021 Goal Status: Active Ulcer/skin breakdown will have a volume reduction of 30% by week 4 Date Initiated: 10/24/2019 Date Inactivated: 01/16/2020 Target Resolution Date: 01/10/2020 Unmet Reason: no change in Goal Status: Unmet measurements. Interventions: Assess patient/caregiver ability to obtain necessary supplies Assess patient/caregiver ability to perform ulcer/skin care regimen upon admission and as needed Assess ulceration(s) every visit Provide education on ulcer and skin care Notes: 11/17/20: Wound care regimen continues Electronic Signature(s) Signed: 09/02/2021 6:12:29 PM By: [name] RN, BSN Entered By: Donnal Debar,  [place] on  09/02/2021 16:17:42 -------------------------------------------------------------------------------- Pain Assessment Details Patient Name: Date of Service: [name] 09/02/2021 3:15 PM Medical Record Number: [id number] Patient Account Number: [id number] Date of Birth/Sex: Treating RN: [birth year] (36 y.o. [name] Primary Care Greta Yung: [name] Other Clinician: Referring Caidence Higashi: Treating Fynn Vanblarcom/Extender: [name] in Treatment: 70 Active Problems Location of Pain Severity and Description of Pain Patient Has Paino No Site Locations Pain Management and Medication Current Pain Management: Electronic Signature(s) Signed: 09/02/2021 6:12:29 PM By: [name] RN, BSN Entered By: [name] on 09/02/2021 15:48:00 -------------------------------------------------------------------------------- Patient/Caregiver Education Details Patient Name: Date of Service: [name] 6/1/2023andnbsp3:15 PM Medical Record Number: [id number] Patient Account Number: [id number] Date of Birth/Gender: Treating RN: 18-Nov-1985 (36 y.o. [name] Primary Care Physician: [name] Other Clinician: Referring Physician: Treating Physician/Extender: [name] in Treatment: 76 Education Assessment Education Provided To: Patient Education Topics Provided Wound/Skin Impairment: Methods: Explain/Verbal Responses: State content correctly [employer]) Signed: 09/02/2021 6:12:29 PM By: [name] RN, BSN Entered By: [name] on 09/02/2021 16:49:50 -------------------------------------------------------------------------------- Wound Assessment Details Patient Name: Date of Service: [name], [name] 09/02/2021 3:15 PM Medical Record Number: [id number] Patient Account Number: [id number] Date of Birth/Sex: Treating RN: 16-May-1985 (36 y.o. [name] Primary Care Nimrit Kehres:  [name] Other Clinician: Referring Eulalio Reamy: Treating Akai Dollard/Extender: [name] in Treatment: 97 Wound Status Wound Number: 11 Primary Etiology: Diabetic Wound/Ulcer of the Lower Extremity Wound Location: Right, Anterior Foot Wound Status: Open Wounding Event: Gradually Appeared Comorbid History: Type II Diabetes Date Acquired: 08/20/2021 Weeks Of Treatment: 1 Clustered Wound: No Photos Wound Measurements Length: (cm) 1 Width: (cm) 1.8 Depth: (cm) 0.1 Area: (cm) 1.414 Volume: (cm) 0.141 % Reduction in Area: -4461.3% % Reduction in Volume: -4600% Epithelialization: Small (1-33%) Tunneling: No Undermining: No Wound Description Classification: Grade 2 Wound Margin: Distinct, outline attached Exudate Amount: Medium Exudate Type: Serosanguineous Exudate Color: red, brown Foul Odor After Cleansing: No Slough/Fibrino No Wound Bed Granulation Amount: Large (67-100%) Exposed Structure Granulation Quality: Red, Pink Fascia Exposed: No Necrotic Amount: None Present (0%) Fat Layer (Subcutaneous Tissue) Exposed: Yes Tendon Exposed: No Muscle Exposed: No Joint Exposed: No Bone Exposed: No Treatment Notes Wound #11 (Foot) Wound Laterality: Right, Anterior Cleanser Soap and Water Discharge Instruction: May shower and wash wound with dial antibacterial soap and water prior to dressing change. Byram Ancillary Kit - 15 Day Supply Discharge Instruction: Use supplies as instructed; Kit contains: (15) Saline Bullets; (15) 3x3 Gauze; 15 pr Gloves Peri-Wound Care Topical keystone antibiotic compound Primary Dressing PolyMem Silver Non-Adhesive Dressing, 4.25x4.25 in Discharge Instruction: Apply to wound bed as instructed Secondary Dressing ABD Pad, 8x10 Discharge Instruction: Apply over primary dressing as directed. Optifoam Non-Adhesive Dressing, 4x4 in Discharge Instruction: Apply over primary dressing as directed. Zetuvit Plus 4x8  in Discharge Instruction: Apply over primary dressing as directed. NonWoven Sponge, 4x4 (in/in) Secured With Coban Self-Adherent Wrap 4x5 (in/yd) Discharge Instruction: Secure with Coban as directed. Kerlix Roll Sterile, 4.5x3.1 (in/yd) Discharge Instruction: Secure with Kerlix as directed. 50M Medipore H Soft Cloth Surgical T ape, 4 x 10 (in/yd) Discharge Instruction: Secure with tape as directed. Compression Wrap Compression Stockings Add-Ons Electronic Signature(s) Signed: 09/02/2021 6:12:29 PM By: [name] RN, BSN Entered By: [name] on 09/02/2021 16:15:08 -------------------------------------------------------------------------------- Wound Assessment Details Patient Name: Date of Service: [name], [name] 09/02/2021 3:15 PM Medical Record Number: [id number] Patient Account Number: [id number] Date of  Birth/Sex: Treating RN: Dec 09, 1985 (36 y.o. [name] Primary Care Arav Bannister: [name] Other Clinician: Referring Tysin Salada: Treating Armine Rizzolo/Extender: [name] in Treatment: 97 Wound Status Wound Number: 3 Primary Etiology: Diabetic Wound/Ulcer of the Lower Extremity Wound Location: Left, Lateral, Plantar Foot Wound Status: Open Wounding Event: Trauma Comorbid History: Type II Diabetes Date Acquired: 10/02/2019 Weeks Of Treatment: 97 Clustered Wound: No Photos Wound Measurements Length: (cm) 3.8 Width: (cm) 4.1 Depth: (cm) 0.4 Area: (cm) 12.237 Volume: (cm) 4.895 % Reduction in Area: -642.1% % Reduction in Volume: -2866.7% Epithelialization: Small (1-33%) Tunneling: No Undermining: Yes Starting Position (o'clock): 4 Ending Position (o'clock): 6 Maximum Distance: (cm) 1.2 Wound Description Classification: Grade 2 Wound Margin: Well defined, not attached Exudate Amount: Medium Exudate Type: Serosanguineous Exudate Color: red, brown Foul Odor After Cleansing: No Slough/Fibrino Yes Wound Bed Granulation  Amount: Large (67-100%) Exposed Structure Granulation Quality: Red, Pink Fascia Exposed: No Necrotic Amount: Small (1-33%) Fat Layer (Subcutaneous Tissue) Exposed: Yes Necrotic Quality: Adherent Slough Tendon Exposed: No Muscle Exposed: No Joint Exposed: No Bone Exposed: No Treatment Notes Wound #3 (Foot) Wound Laterality: Plantar, Left, Lateral Cleanser Soap and Water Discharge Instruction: May shower and wash wound with dial antibacterial soap and water prior to dressing change. Byram Ancillary Kit - 15 Day Supply Discharge Instruction: Use supplies as instructed; Kit contains: (15) Saline Bullets; (15) 3x3 Gauze; 15 pr Gloves Peri-Wound Care Topical keystone antibiotic compound Primary Dressing PolyMem Silver Non-Adhesive Dressing, 4.25x4.25 in Discharge Instruction: Apply to wound bed as instructed Secondary Dressing ABD Pad, 8x10 Discharge Instruction: Apply over primary dressing as directed. Optifoam Non-Adhesive Dressing, 4x4 in Discharge Instruction: Apply over primary dressing as directed. Zetuvit Plus 4x8 in Discharge Instruction: Apply over primary dressing as directed. NonWoven Sponge, 4x4 (in/in) Secured With Coban Self-Adherent Wrap 4x5 (in/yd) Discharge Instruction: Secure with Coban as directed. Kerlix Roll Sterile, 4.5x3.1 (in/yd) Discharge Instruction: Secure with Kerlix as directed. 63M Medipore H Soft Cloth Surgical T ape, 4 x 10 (in/yd) Discharge Instruction: Secure with tape as directed. Compression Wrap Compression Stockings Add-Ons Electronic Signature(s) Signed: 09/02/2021 6:12:29 PM By: [name] RN, BSN Entered By: [name] on 09/02/2021 16:13:09 -------------------------------------------------------------------------------- Wound Assessment Details Patient Name: Date of Service: [name], [name] 09/02/2021 3:15 PM Medical Record Number: [id number] Patient Account Number: [id number] Date of Birth/Sex: Treating RN: May 23, 1985 (36  y.o. [name] Primary Care Cathaleen Korol: [name] Other Clinician: Referring Robey Massmann: Treating Alysia Scism/Extender: [name] in Treatment: 97 Wound Status Wound Number: 8 Primary Etiology: Diabetic Wound/Ulcer of the Lower Extremity Wound Location: Right, Lateral Foot Wound Status: Open Wounding Event: Blister Comorbid History: Type II Diabetes Date Acquired: 04/18/2021 Weeks Of Treatment: 19 Clustered Wound: No Photos Wound Measurements Length: (cm) 4 Width: (cm) 3.3 Depth: (cm) 1.2 Area: (cm) 10.367 Volume: (cm) 12.441 % Reduction in Area: -1784.9% % Reduction in Volume: -11210% Epithelialization: Small (1-33%) Tunneling: No Undermining: Yes Starting Position (o'clock): 3 Ending Position (o'clock): 5 Maximum Distance: (cm) 1 Wound Description Classification: Grade 2 Wound Margin: Well defined, not attached Exudate Amount: Large Exudate Type: Serosanguineous Exudate Color: red, brown Foul Odor After Cleansing: No Slough/Fibrino Yes Wound Bed Granulation Amount: Large (67-100%) Exposed Structure Granulation Quality: Red, Pink Fascia Exposed: No Necrotic Amount: Small (1-33%) Fat Layer (Subcutaneous Tissue) Exposed: Yes Necrotic Quality: Adherent Slough Tendon Exposed: No Muscle Exposed: No Joint Exposed: No Bone Exposed: No Treatment Notes Wound #8 (Foot) Wound Laterality: Right, Lateral Cleanser Soap and Water Discharge Instruction: May shower  and wash wound with dial antibacterial soap and water prior to dressing change. Byram Ancillary Kit - 15 Day Supply Discharge Instruction: Use supplies as instructed; Kit contains: (15) Saline Bullets; (15) 3x3 Gauze; 15 pr Gloves Peri-Wound Care Topical keystone antibiotic compound Primary Dressing PolyMem Silver Non-Adhesive Dressing, 4.25x4.25 in Discharge Instruction: Apply to wound bed as instructed Secondary Dressing ABD Pad, 8x10 Discharge Instruction: Apply over  primary dressing as directed. Optifoam Non-Adhesive Dressing, 4x4 in Discharge Instruction: Apply over primary dressing as directed. Zetuvit Plus 4x8 in Discharge Instruction: Apply over primary dressing as directed. NonWoven Sponge, 4x4 (in/in) Secured With Coban Self-Adherent Wrap 4x5 (in/yd) Discharge Instruction: Secure with Coban as directed. Kerlix Roll Sterile, 4.5x3.1 (in/yd) Discharge Instruction: Secure with Kerlix as directed. 87M Medipore H Soft Cloth Surgical T ape, 4 x 10 (in/yd) Discharge Instruction: Secure with tape as directed. Compression Wrap Compression Stockings Add-Ons Electronic Signature(s) Signed: 09/02/2021 6:12:29 PM By: [name] RN, BSN Entered By: [name] on 09/02/2021 16:12:36 -------------------------------------------------------------------------------- Vitals Details Patient Name: Date of Service: [name], [name] 09/02/2021 3:15 PM Medical Record Number: [id number] Patient Account Number: [id number] Date of Birth/Sex: Treating RN: [birth year] (36 y.o. [name] Primary Care Shrihan Putt: [name] Other Clinician: Referring Zhane Donlan: Treating Jaquilla Woodroof/Extender: [name] in Treatment: 97 Vital Signs Time Taken: 15:48 Temperature (F): 98.4 Height (in): 77 Pulse (bpm): 94 Weight (lbs): 280 Respiratory Rate (breaths/min): 18 Body Mass Index (BMI): 33.2 Blood Pressure (mmHg): 149/88 Capillary Blood Glucose (mg/dl): 115 Reference Range: 80 - 120 mg / dl Notes glucose per pt report Electronic Signature(s) Signed: 09/02/2021 6:12:29 PM By: [name] RN, BSN Entered By: [name] on 09/02/2021 15:54:00

## 2021-09-02 NOTE — Progress Notes (Signed)
[place], [name] ([id number]) Visit Report for 09/02/2021 Chief Complaint Document Details Patient Name: Date of Service: [name] 09/02/2021 3:15 PM Medical Record Number: [id number] Patient Account Number: [id number] Date of Birth/Sex: Treating RN: 12/13/1985 (36 y.o. [name] Primary Care Provider: [name] Other Clinician: Referring Provider: Treating Provider/Extender: [name] in Treatment: 12 Information Obtained from: Patient Chief Complaint 01/11/2019; patient is here for review of a rather substantial wound over the left fifth plantar metatarsal head extending into the lateral part of his foot 10/24/2019; patient returns to clinic with wounds on his bilateral feet with underlying osteomyelitis biopsy-proven Electronic Signature(s) Signed: 09/02/2021 4:43:31 PM By: [name] MD FACS Entered By: [name] on 09/02/2021 16:43:31 -------------------------------------------------------------------------------- Debridement Details Patient Name: Date of Service: [name], [name] 09/02/2021 3:15 PM Medical Record Number: [id number] Patient Account Number: [id number] Date of Birth/Sex: Treating RN: 02/04/1986 (36 y.o. [name] Primary Care Provider: [name] Other Clinician: Referring Provider: Treating Provider/Extender: [name] in Treatment: 97 Debridement Performed for Assessment: Wound #11 Right,Anterior Foot Performed By: Physician [name], MD Debridement Type: Debridement Severity of Tissue Pre Debridement: Fat layer exposed Level of Consciousness (Pre-procedure): Awake and Alert Pre-procedure Verification/Time Out Yes - 16:16 Taken: Start Time: 16:16 T Area Debrided (L x W): otal 1 (cm) x 1.8 (cm) = 1.8 (cm) Tissue and other material debrided: Non-Viable, Eschar, Slough, Slough Level: Non-Viable Tissue Debridement Description: Selective/Open  Wound Instrument: Curette Bleeding: Minimum Hemostasis Achieved: Pressure End Time: 16:18 Procedural Pain: 0 Post Procedural Pain: 0 Response to Treatment: Procedure was tolerated well Level of Consciousness (Post- Awake and Alert procedure): Post Debridement Measurements of Total Wound Length: (cm) 1 Width: (cm) 1.8 Depth: (cm) 0.1 Volume: (cm) 0.141 Character of Wound/Ulcer Post Debridement: Improved Severity of Tissue Post Debridement: Fat layer exposed Post Procedure Diagnosis Same as Pre-procedure Electronic Signature(s) Signed: 09/02/2021 4:49:27 PM By: [name] MD FACS Signed: 09/02/2021 6:12:29 PM By: [name] RN, BSN Entered By: [name] on 09/02/2021 16:18:43 -------------------------------------------------------------------------------- Debridement Details Patient Name: Date of Service: [name], [name] 09/02/2021 3:15 PM Medical Record Number: [id number] Patient Account Number: [id number] Date of Birth/Sex: Treating RN: 1985/04/11 (36 y.o. [name] Primary Care Provider: [name] Other Clinician: Referring Provider: Treating Provider/Extender: [name] in Treatment: 97 Debridement Performed for Assessment: Wound #8 Right,Lateral Foot Performed By: Physician [name], MD Debridement Type: Debridement Severity of Tissue Pre Debridement: Fat layer exposed Level of Consciousness (Pre-procedure): Awake and Alert Pre-procedure Verification/Time Out Yes - 16:16 Taken: Start Time: 16:18 T Area Debrided (L x W): otal 4 (cm) x 3.3 (cm) = 13.2 (cm) Tissue and other material debrided: Non-Viable, Fat, Slough, Slough Level: Skin/Subcutaneous Tissue Debridement Description: Excisional Instrument: Curette Bleeding: Minimum Hemostasis Achieved: Pressure End Time: 16:20 Procedural Pain: 0 Post Procedural Pain: 0 Response to Treatment: Procedure was tolerated well Level of Consciousness (Post-  Awake and Alert procedure): Post Debridement Measurements of Total Wound Length: (cm) 4 Width: (cm) 3.3 Depth: (cm) 1.2 Volume: (cm) 12.441 Character of Wound/Ulcer Post Debridement: Improved Severity of Tissue Post Debridement: Fat layer exposed Post Procedure Diagnosis Same as Pre-procedure Electronic Signature(s) Signed: 09/02/2021 4:49:27 PM By: [name] MD FACS Signed: 09/02/2021 6:12:29 PM By: [name] RN, BSN Entered By: [name] on 09/02/2021 16:19:25 -------------------------------------------------------------------------------- Debridement Details Patient Name: Date of Service: [name], [name] 09/02/2021 3:15 PM Medical Record Number: [id number] Patient Account Number: [id number]  Date of Birth/Sex: Treating RN: October 30, 1985 (36 y.o. [name] Primary Care Provider: [name] Other Clinician: Referring Provider: Treating Provider/Extender: [name] in Treatment: 97 Debridement Performed for Assessment: Wound #3 Left,Lateral,Plantar Foot Performed By: Physician [name], MD Debridement Type: Debridement Severity of Tissue Pre Debridement: Fat layer exposed Level of Consciousness (Pre-procedure): Awake and Alert Pre-procedure Verification/Time Out Yes - 16:16 Taken: Start Time: 16:20 T Area Debrided (L x W): otal 3.8 (cm) x 4.1 (cm) = 15.58 (cm) Tissue and other material debrided: Non-Viable, Callus, Slough, Slough Level: Non-Viable Tissue Debridement Description: Selective/Open Wound Instrument: Blade, Curette, Forceps Bleeding: Minimum Hemostasis Achieved: Pressure End Time: 16:23 Procedural Pain: 0 Post Procedural Pain: 0 Response to Treatment: Procedure was tolerated well Level of Consciousness (Post- Awake and Alert procedure): Post Debridement Measurements of Total Wound Length: (cm) 3.8 Width: (cm) 4.1 Depth: (cm) 0.4 Volume: (cm) 4.895 Character of Wound/Ulcer Post Debridement:  Improved Severity of Tissue Post Debridement: Fat layer exposed Post Procedure Diagnosis Same as Pre-procedure Electronic Signature(s) Signed: 09/02/2021 4:49:27 PM By: [name] MD FACS Signed: 09/02/2021 6:12:29 PM By: [name] RN, BSN Entered By: [name] on 09/02/2021 16:23:31 -------------------------------------------------------------------------------- HPI Details Patient Name: Date of Service: [name], [name] 09/02/2021 3:15 PM Medical Record Number: [id number] Patient Account Number: [id number] Date of Birth/Sex: Treating RN: [birth year] (36 y.o. 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 He comes in for review of a wound over the plantar fifth metatarsal head extending into the lateral part of the foot. He was followed for this previously by his podiatrist Dr. Cornelius Moras. As the patient tells his story he went to see podiatry first for a swelling he developed on the lateral part of his fifth metatarsal head in May. He states this was "open" by podiatry and the area closed. He was followed up in June and it was again opened callus removed and it closed promptly. There were plans being made for surgery on the fifth metatarsal head in June however his blood sugar was apparently too high for anesthesia. Apparently the area was debrided and opened again in June and it is never closed since. Looking over the records from podiatry I am really not able to follow this. It was clear when he was first seen it was before 5/14 at that point he already had a wound. By 5/17 the ulcer was resolved. I do not see anything about a procedure. On 5/28 noted to have pre-ulcerative moderate keratosis. X-ray noted  1/5 contracted toe and tailor's bunion and metatarsal deformity. On a visit date on 09/28/2018 the dorsal part of the left foot it healed and resolved. There was concern about swelling in his lower extremity he was sent to the ER.. As far as I can tell he was seen in the ER on 7/12 with an ulcer on his left foot. A DVT rule out of the left leg was negative. I do not think I have complete records from podiatry but I am not able to verify the procedures this patient states he had. He states after the last procedure the wound has never closed although I am not able to follow this in the records I have from podiatry. He has not had a recent x-ray The patient has  been using Neosporin on the wound. He is wearing a Darco shoe. He is still very active up on his foot working and exercising. Past medical history; type 2 diabetes ketosis-prone, leg swelling with a negative DVT study in July. Non-smoker ABI in our clinic was 0.85 on the left 10/16; substantial wound on the plantar left fifth met head extending laterally almost to the dorsal fifth MTP. We have been using silver alginate we gave him a Darco forefoot off loader. An x-ray did not show evidence of osteomyelitis did note soft tissue emphysema which I think was due to gas tracking through an open wound. There is no doubt in my mind he requires an MRI 10/23; MRI not booked until 3 November at the earliest this is largely due to his glucose sensor in the right arm. We have been using silver alginate. There has been an improvement 10/29; I am still not exactly sure when his MRI is booked for. He says it is the third but it is the 10th in epic. This definitely needs to be done. He is running a low-grade fever today but no other symptoms. No real improvement in the 1 02/26/2019 patient presents today for a follow-up visit here in our clinic he is last been seen in the clinic on October 29. Subsequently we were working on getting MRI to evaluate and see what  exactly was going on and where we would need to go from the standpoint of whether or not he had osteomyelitis and again what treatments were going be required. Subsequently the patient ended up being admitted to the hospital on 02/07/2019 and was discharged on 02/14/2019. This is a somewhat interesting admission with a discharge diagnosis of pneumonia due to COVID-19 although he was positive for COVID-19 when tested at the urgent care but negative x2 when he was actually in the hospital. With that being said he did have acute respiratory failure with hypoxia and it was noted he also have a left foot ulceration with osteomyelitis. With that being said he did require oxygen for his pneumonia and I level 4 L. He was placed on antivirals and steroids for the COVID-19. He was also transferred to the [facility] at one point. Nonetheless he did subsequently discharged home and since being home has done much better in that regard. The CT angiogram did not show any pulmonary embolism. With regard to the osteomyelitis the patient was placed on vancomycin and Zosyn while in the hospital but has been changed to Augmentin at discharge. It was also recommended that he follow- up with wound care and podiatry. Podiatry however wanted him to see [name] according to the patient prior to them doing anything further. His hemoglobin A1c was 9.9 as noted in the hospital. Have an MRI of the left foot performed while in the hospital on 02/04/2019. 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 There is an overlying plantar open wound noted an abscess tracking back along the lateral aspect of the fifth metatarsal shaft. There is otherwise diffuse cellulitis and mild fasciitis without findings of polymyositis. The patient did have recently pneumonia secondary to COVID-19 I looked in the chart through epic and it does appear that the  patient may need to have an additional x-ray just to ensure everything is cleared and that he has no airspace disease prior to putting him into the [name]. 03/05/2019; patient was readmitted to the clinic last  week. He was hospitalized twice for a viral upper respiratory tract infection from 11/1 through 11/4 and then 11/5 through 11/12 ultimately this turned out to be Covid pneumonitis. Although he was discharged on oxygen he is not using it. He says he feels fine. He has no exercise limitation no cough no sputum. His O2 sat in our clinic today was 100% on room air. 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 He received Vanco and Zosyn in the hospital and then was discharged on 2 weeks of Augmentin. I do not see any relevant cultures. He was supposed to follow-up with infectious disease but I do not see that he has an appointment. 12/8; patient saw Dr. Novella Olive of infectious disease last week. He felt that he had had adequate antibiotic therapy. He did not go to follow-up with Dr. Amalia Hailey of podiatry and I have again talked to him about the pros and cons of this. He does not want to consider a ray amputation of this time. He is aware of the risks of recurrence, migration etc. He started HBO today and tolerated this well. He can complete the Augmentin that I gave him last week. I have looked over the lab work that Dr. Chana Bode ordered his C-reactive protein was 3.3 and his sedimentation rate was 17. The C-reactive protein is never really been measurably that high in this patient 12/15; not much change in the wound today however he has undermining along the lateral part of the foot again more extensively than last week. He has some rims of epithelialization. We have been using silver alginate. He is undergoing hyperbarics but did not dive today 12/18; in for his obligatory first total contact  cast change. Unfortunately there was pus coming from the undermining area around his fifth metatarsal head. This was cultured but will preclude reapplication of a cast. He is seen in conjunction with HBO 12/24; patient had staph lugdunensis in the wound in the undermining area laterally last time. We put him on doxycycline which should have covered this. The wound looks better today. I am going to give him another week of doxycycline before reattempting the total contact cast 12/31; the patient is completing antibiotics. Hemorrhagic debris in the distal part of the wound with some undermining distally. He also had hyper granulation. Extensive debridement with a #5 curette. The infected area that was on the lateral part of the fifth met head is closed over. I do not think he needs any more antibiotics. Patient was seen prior to HBO. Preparations for a total contact cast were made in the cast will be placed post hyperbarics 04/11/19; once again the patient arrives today without complaint. He had been in a cast all week noted that he had heavy drainage this week. This resulted in large raised areas of macerated tissue around the wound 1/14; wound bed looks better slightly smaller. Hydrofera Blue has been changing himself. He had a heavy drainage last week which caused a lot of maceration around the wound so I took him out of a total contact cast he says the drainage is actually better this week He is seen today in conjunction with HBO 1/21; returns to clinic. He was up in [place] for a day or 2 attending a funeral. He comes back in with the wound larger and with a large area of exposed bone. He had osteomyelitis and septic arthritis of the  fifth left metatarsal head while he was in hospital. He received IV antibiotics in the hospital for a prolonged period of time then 3 weeks of Augmentin. Subsequently I gave him 2 weeks of doxycycline for more superficial wound infection. When I saw this last week the  wound was smaller the surface of the wound looks satisfactory. 1/28; patient missed hyperbarics today. Bone biopsy I did last time showed Enterococcus faecalis and Staphylococcus lugdunensis . He has a wide area of exposed bone. We are going to use silver alginate as of today. I had another ethical discussion with the patient. 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 In this situation I think the likelihood of healing this is low. Therefore I have recommended a ray amputation and with the patient's agreement I have referred him to Dr. Doran Durand. The other issue is that his compliance with hyperbarics has been minimal because of his work schedule and given his underlying decision I am going to stop this today READMISSION 10/24/2019 MRI 09/29/2019 left foot IMPRESSION: 1. Apparent skin ulceration inferior and lateral to the 5th metatarsal base with underlying heterogeneous T2 signal and enhancement in the subcutaneous fat. Small peripherally enhancing fluid collections along the plantar and lateral aspects of the 5th metatarsal base suspicious for abscesses. 2. Interval amputation through the mid 5th metatarsal with nonspecific low-level marrow edema and enhancement. Given the proximity to the adjacent soft tissue inflammatory changes, osteomyelitis cannot be excluded. 3. The additional bones appear unremarkable. MRI 09/29/2019 right foot IMPRESSION: 1. Soft tissue ulceration lateral to the 5th MTP joint. There is low-level T2 hyperintensity within the 4th and 5th metatarsal heads and adjacent proximal phalanges without abnormal T1 signal or cortical destruction. These findings are nonspecific and could be seen with early marrow edema, hyperemia or early osteomyelitis. No evidence of septic joint. 2. Mild tenosynovitis and synovial enhancement associated with the extensor digitorum tendons at the level of the midfoot. 3. Diffuse low-level muscular T2  hyperintensity and enhancement, most consistent with diabetic myopathy. LEFT FOOT BONE Methicillin resistant staphylococcus aureus Staphylococcus lugdunensis MIC MIC CIPROFLOXACIN >=8 RESISTANT Resistant <=0.5 SENSI... Sensitive CLINDAMYCIN <=0.25 SENS... Sensitive >=8 RESISTANT Resistant ERYTHROMYCIN >=8 RESISTANT Resistant >=8 RESISTANT Resistant GENTAMICIN <=0.5 SENSI... Sensitive <=0.5 SENSI... Sensitive Inducible Clindamycin NEGATIVE Sensitive NEGATIVE Sensitive OXACILLIN >=4 RESISTANT Resistant 2 SENSITIVE Sensitive RIFAMPIN <=0.5 SENSI... Sensitive <=0.5 SENSI... Sensitive TETRACYCLINE <=1 SENSITIVE Sensitive <=1 SENSITIVE Sensitive TRIMETH/SULFA <=10 SENSIT Sensitive <=10 SENSIT Sensitive ... Marland Kitchen.. VANCOMYCIN 1 SENSITIVE Sensitive <=0.5 SENSI... Sensitive Right foot bone . Component 3 wk ago Specimen Description BONE Special Requests RIGHT 4 METATARSAL SAMPLE B Gram Stain NO WBC SEEN NO ORGANISMS SEEN Culture RARE METHICILLIN RESISTANT STAPHYLOCOCCUS AUREUS NO ANAEROBES ISOLATED Performed at [facility], [place] [address] [place], [place] [address] Report Status 10/08/2019 FINAL Organism ID, Bacteria METHICILLIN RESISTANT STAPHYLOCOCCUS AUREUS Resulting Agency [facility] Susceptibility Methicillin resistant staphylococcus aureus MIC CIPROFLOXACIN >=8 RESISTANT Resistant CLINDAMYCIN <=0.25 SENS... Sensitive ERYTHROMYCIN >=8 RESISTANT Resistant GENTAMICIN <=0.5 SENSI... Sensitive Inducible Clindamycin NEGATIVE Sensitive OXACILLIN >=4 RESISTANT Resistant RIFAMPIN <=0.5 SENSI... Sensitive TETRACYCLINE <=1 SENSITIVE Sensitive TRIMETH/SULFA <=10 SENSIT Sensitive ... VANCOMYCIN 1 SENSITIVE Sensitive This is a patient we had in clinic earlier this year with a wound over his left fifth metatarsal head. 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 Doran Durand on 05/16/2019. He seems to have developed problems with wounds on his bilateral feet in June 2021 although he may have had problems earlier than this. He was in an urgent care with a right foot ulcer on 09/26/2019 and given a course of doxycycline. This was apparently after having trouble getting into see orthopedics. He was seen by podiatry on 09/28/2019 noted to have bilateral lower extremity ulcers including the left lateral fifth metatarsal base and the right subfifth met head. It was noted that had purulent drainage at that time. He required hospitalization from 6/20 through 7/2. This was because of worsening right foot wounds. He underwent bilateral operative incision and drainage and bone biopsies bilaterally. Culture results are listed above. He has been referred back to clinic by Dr. Jacqualyn Posey of podiatry. He is also followed by Dr. Megan Salon who saw him yesterday. He was discharged from hospital on Zyvox Flagyl and Levaquin and yesterday changed to doxycycline Flagyl and Levaquin. His inflammatory markers on 6/26 showed a sedimentation rate of 129 and a C-reactive protein of 5. This is improved to 14 and 1.3 respectively. This would indicate improvement. ABIs in our clinic today were 1.23 on the right and 1.20 on the left 11/01/2019 on evaluation today patient appears to be doing fairly well in regard to the wounds on his feet at this point. Fortunately there is no signs of active infection at this time. No fevers, chills, nausea, vomiting, or diarrhea. He currently is seeing infectious disease and still under their care at this point. Subsequently he also has both wounds which she has not been using collagen on as he did not receive that in his packaging he did not call us and let us know that. Apparently that just was missed on the  order. Nonetheless we will get that straightened out today. 8/9-Patient returns for bilateral foot wounds, using Prisma with hydrogel moistened dressings, and the wounds appear stable. Patient using surgical shoes, avoiding much pressure or weightbearing as much as possible 8/16; patient has bilateral foot wounds. 1 on the right lateral foot proximally the other is on the left mid lateral foot. Both required debridement of callus and thick skin around the wounds. We have been using silver collagen 8/27; patient has bilateral lateral foot wounds. The area on the left substantially surrounded by callus and dry skin. This was removed from the wound edge. The underlying wound is small. The area on the right measured somewhat smaller today. 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 Unfortunately I did not update his antibiotics during today's visit. 9/10 I reviewed Dr. Hale Bogus last notes he felt he had completed antibiotics his inflammatory markers were reasonably well controlled. He has a small wound on the lateral left foot and a tiny area on the right which is just above closed. He is using Hydrofera Blue with border foam he has bilateral surgical shoes 9/24; 2 week f/u. doing well. right foot is closed. left foot still undermined. 10/14; right foot remains closed at the fifth met head. The area over the base of the left fifth metatarsal has a small open area but considerable undermining towards the plantar foot. Thick callus skin around this suggests an adequate pressure relief. We have talked about this. He says he is going to go back into his cam boot. I suggested a total contact cast he did not seem  enamored with this suggestion 10/26; left foot base of the fifth metatarsal. Same condition as last time. He has skin over the area with an open wound however the skin is not adherent. He went to see Dr. Earleen Newport who did an x-ray and  culture of his foot I have not reviewed the x-ray but the patient was not told anything. He is on doxycycline 11/11; since the patient was last here he was in the emergency room on 10/30 he was concerned about swelling in the left foot. They did not do any cultures or x-rays. They changed his antibiotics to cephalexin. Previous culture showed group B strep. The cephalexin is appropriate as doxycycline has less than predictable coverage. Arrives in clinic today with swelling over this area under the wound. He also has a new wound on the right fifth metatarsal head 11/18; the patient has a difficult wound on the lateral aspect of the left fifth metatarsal head. The wound was almost ballotable last week I opened it slightly expecting to see purulence however there was just bleeding. I cultured this this was negative. X-ray unchanged. We are trying to get an MRI but I am not sure were going to be able to get this through his insurance. He also has an area on the right lateral fifth metatarsal head this looks healthier 12/3; the patient finally got our MRI. Surprisingly this did not show osteomyelitis. I did show the soft tissue ulceration at the lateral plantar aspect of the fifth metatarsal base with a tiny residual 6 mm abscess overlying the superficial fascia I have tried to culture this area I have not been able to get this to grow anything. Nevertheless the protruding tissue looks aggravated. I suspect we should try to treat the underlying "abscess with broad-spectrum antibiotics. I am going to start him on Levaquin and Flagyl. He has much less edema in his legs and I am going to continue to wrap his legs and see him weekly 12/10. I started Levaquin and Flagyl on him last week. He just picked up the Flagyl apparently there was some delay. The worry is the wound on the left fifth metatarsal base which is substantial and worsening. His foot looks like he inverts at the ankle making this a weightbearing  surface. Certainly no improvement in fact I think the measurements of this are somewhat worse. We have been using 12/17; he apparently just got the Levaquin yesterday this is 2 weeks after the fact. He has completed the Flagyl. The area over the left fifth metatarsal base still has protruding granulation tissue although it does not look quite as bad as it did some weeks ago. He has severe bilateral lymphedema although we have not been treating him for wounds on his legs this is definitely going to require compression. There was so much edema in the left I did not wish to put him in a total contact cast today. I am going to increase his compression from 3-4 layer. The area on the right lateral fifth met head actually look quite good and superficial. 12/23; patient arrived with callus on the right fifth met head and the substantial hyper granulated callused wound on the base of his fifth metatarsal. He says he is completing his Levaquin in 2 days but I do not think that adds up with what I gave him but I will have to double check this. We are using Hydrofera Blue on both areas. My plan is to put the left leg in a cast  the week after New Year's 04/06/2020; patient's wounds about the same. Right lateral fifth metatarsal head and left lateral foot over the base of the fifth metatarsal. There is undermining on the left lateral foot which I removed before application of total contact cast continuing with Hydrofera Blue new. Patient tells me he was seen by endocrinology today lab work was done [Dr. Kerr]. Also wondering whether he was referred to cardiology. I went over some lab work from previously does not have chronic renal failure certainly not nephrotic range proteinuria he does have very poorly controlled diabetes but this is not his most updated lab work. Hemoglobin A1c has been over 11 1/10; the patient had a considerable amount of leakage towards mid part of his left foot with macerated skin however  the wound surface looks better the area on the right lateral fifth met head is better as well. I am going to change the dressing on the left foot under the total contact cast to silver alginate, continue with Hydrofera Blue on the right. 1/20; patient was in the total contact cast for 10 days. Considerable amount of drainage although the skin around the wound does not look too bad on the left foot. The area on the right fifth metatarsal head is closed. Our nursing staff reports large amount of drainage out of the left lateral foot wound 1/25; continues with copious amounts of drainage described by our intake staff. PCR culture I did last week showed E. coli and Enterococcus faecalis and low quantities. Multiple resistance genes documented including extended spectrum beta lactamase, MRSA, MRSE, quinolone, tetracycline. The wound is not quite as good this week as it was 5 days ago but about the same size 2/3; continues with copious amounts of malodorous drainage per our intake nurse. The PCR culture I did 2 weeks ago showed E. coli and low quantities of Enterococcus. There were multiple resistance genes detected. I put Neosporin on him last week although this does not seem to have helped. The wound is slightly deeper today. Offloading continues to be an issue here although with the amount of drainage she has a total contact cast is just not going to work 2/10; moderate amount of drainage. Patient reports he cannot get his stocking on over the dressing. I told him we have to do that the nurse gave him suggestions on how to make this work. The wound is on the bottom and lateral part of his left foot. Is cultured predominantly grew low amounts of Enterococcus, E. coli and anaerobes. There were multiple resistance genes detected including extended spectrum beta lactamase, quinolone, tetracycline. I could not think of an easy oral combination to address this so for now I am going to do topical antibiotics  provided by [facility] I think the main agents here are vancomycin and an aminoglycoside. We have to be able to give him access to the wounds to get the topical antibiotic on 2/17; moderate amount of drainage this is unchanged. He has his Keystone topical antibiotic against the deep tissue culture organisms. He has been using this and changing the dressing daily. Silver alginate on the wound surface. 2/24; using Keystone antibiotic with silver alginate on the top. He had too much drainage for a total contact cast at one point although I think that is improving and I think in the next week or 2 it might be possible to replace a total contact cast I did not do this today. In general the wound surface looks healthy however he  continues to have thick rims of skin and subcutaneous tissue around the wide area of the circumference which I debrided 06/04/2020 upon evaluation today patient appears to be doing well in regard to his wound. I do feel like he is showing signs of improvement. There is little bit of callus and dead tissue around the edges of the wound as well as what appears to be a little bit of a sinus tract that is off to the side laterally I would perform debridement to clear that away today. 3/17; left lateral foot. The wound looks about the same as I remember. Not much depth surface looks healthy. No evidence of infection 3/25; left lateral foot. Wound surface looks about the same. Separating epithelium from the circumference. There really is no evidence of infection here however not making progress by my view 3/29; left lateral foot. Surface of the wound again looks reasonably healthy still thick skin and subcutaneous tissue around the wound margins. There is no evidence of infection. One of the concerns being brought up by the nurses has again the amount of drainage vis--vis continued use of a total contact cast 4/5; left lateral foot at roughly the base of the fifth metatarsal. Nice healthy  looking granulated tissue with rims of epithelialization. The overall wound measurements are not any better but the tissue looks healthy. The only concern is the amount of drainage although he has no surrounding maceration with what we have been doing recently to absorb fluid and protect his skin. He also has lymphedema. 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 He states that the kerlix/coban under the TCC rolled down and could not get it back up. He has been using an offloading scooter and has somehow hurt his right foot using this device. This happened last week. He states that the side of his right foot developed a blister and opened. The top of his foot also has a few small open wounds he thinks is due to his socks rubbing in his shoes. He has not been using any dressings to the wound. He denies purulent drainage, fever/chills or erythema to the wounds. 4/22; patient presents for 1 week follow-up. He developed new wounds to the right foot that were evaluated at last clinic visit. He continues to have a total contact cast to the left leg and he reports no issues. He has been using silver collagen to the right foot wounds with no issues. He denies purulent drainage, fever/chills or erythema to the right foot wounds. He has no complaints today 4/25; patient presents for 1 week follow-up. He has a total contact cast of the left leg and reports no issues. He has been using silver alginate to the right foot wound. He denies purulent drainage, fever/chills or erythema to the right foot wounds. 5/2 patient presents for 1 week follow-up. T contact cast on the left. The wound which is on the base of the plantar foot at the base of the fifth  metatarsal otal actually looks quite good and dimensions continue to gradually contract. HOWEVER the area on the right lateral fifth metatarsal head is much larger than what I remember from 2 weeks ago. Once more is he has significant levels of hypergranulation. Noteworthy that he  had this same hyper granulated response on his wound on the left foot at one point in time. So much so that he I thought there was an underlying fluid collection. Based on this I think this just needs debridement. 5/9; the wound on the left actually continues to be gradually smaller with a healthy surface. Slight amount of drainage and maceration of the skin around but not too bad. However he has a large wound over the right fifth metatarsal head very much in the same configuration as his left foot wound was initially. I used silver nitrate to address the hyper granulated tissue no mechanical debridement 5/16; area on the left foot did not look as healthy this week deeper thick surrounding macerated skin and subcutaneous tissue. The area on the right foot fifth met head was about the same The area on the right ankle that we identified last week is completely broken down into an open wound presumably a stocking rubbing issue 5/23; patient has been using a total contact cast to the left side. He has been using silver alginate underneath. He has also been using silver alginate to the right foot wounds. He has no complaints today. He denies any signs of infection. 5/31; the left-sided wound looks some better measure smaller surface granulation looks better. We have been using silver alginate under the total contact cast The large area on his right fifth met head and right dorsal foot look about the same still using silver alginate 6/6; neither side is good as I was hoping although the surface area dimensions are better. A lot of maceration on his left and right foot around the wound edge. Area on the dorsal right foot looks  better. He says he was traveling. I am not sure what does the amount of maceration around the plantar wounds may be drainage issues 6/13; in general the wound surfaces look quite good on both sides. Macerated skin and raised edges around the wound required debridement although in general especially on the left the surface area seems improved. The area on the right dorsal ankle is about the same I thought this would not be such a problem to close 6/20; not much change in either wound although the one on the right looks a little better. Both wounds have thick macerated edges to the skin requiring debridements. We have been using silver alginate. The area on the dorsal right ankle is still open I thought this would be closed. 6/28; patient comes in today with a marked deterioration in the right foot wound fifth met head. Wide area of exposed bone this is a drastic change from last time. The area on the left there we have been casting is stagnant. We have been using silver alginate in both wound areas. 7/5; bone culture I did for PCR last time was positive for Pseudomonas, group B strep, Enterococcus and Staph aureus. There was no suggestion of methicillin resistance or ampicillin resistant genes. This was resistant to tetracycline however He comes into the clinic today with the area over his right plantar fifth metatarsal head which had been doing so well 2 weeks ago completely necrotic feeling bone. I do not know that this is going to be salvageable. The left foot wound is certainly no smaller but it has a better surface and is superficial. 7/8; patient called in this morning to say that his total contact cast was rubbing against his foot. He states he is doing fine overall. He denies signs of infection. 7/12; continued  deterioration in the wound over the right fifth metatarsal head crumbling bone. This is not going to be salvageable. The patient agrees and wants to be referred to Dr. Doran Durand which we  will attempt to arrange as soon as possible. I am going to continue him on antibiotics as long as that takes so I will renew those today. The area on the left foot which is the base of the fifth metatarsal continues to look somewhat better. Healthy looking tissue no depth no debridement is necessary here. 7/20; the patient was kindly seen by Dr. Doran Durand of orthopedics on 10/19/2020. 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 Towards this end we have taken him out of the total contact cast on the left we will put him in a wrap with Hydrofera Blue. As I understand things surgery is planned for 7/21 7/27; patient had his surgery last Thursday. 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 Apparently everything went well we did not still disturb that today The area on the left foot actually looks quite good. He has been much less mobile which probably explains this he did not seem to do well in the total contact cast secondary to drainage and maceration I think. 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 Fortunately there is no evidence of active infection at this time. No fevers, chills, nausea, vomiting, or diarrhea. Overall I think that he is actually doing extremely well. Nonetheless I do believe that he is staying off of this more following the surgery in his right foot that is the reason the left is doing so great. 8/16; left plantar foot wound. This looks smaller than the last time I saw this he is using Hydrofera Blue. The surgical wound on the right foot is being followed by Dr. Doran Durand we did not look at this today. He has surgical shoes on both feet 8/23; left plantar foot wound not as good this week. Surrounding macerated skin and subcutaneous tissue everything looks moist and wet. I do not think he  is offloading this adequately. He is using a surgical shoe Apparently the right foot surgical wound is not open although I did not check his foot 8/31; left plantar foot lateral aspect. Much improved this week. He has no maceration. Some improvement in the surface area of the wound but most impressively the depth is come in we are using silver alginate. The patient is a Product/process development scientist. He is asked that we write him a letter so he can go back to work. I have also tried to see if we can write something that will allow him to limit the amount of time that he is on his foot at work. 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 Hopefully the latter can be avoided 9/6; I believe the patient missed an appointment last week. He arrives in today with a wound looking roughly the same certainly no better. Undermining laterally and also inferiorly. We used molecuLight today in training with the patient's permission.. We are using silver alginate 9/21 wound is measuring bigger this week although this may have to do with the aggressive circumferential debridement last week in response to the blush fluorescence  on the Molecu[employer]. Culture I did last week showed significant MSSA and E. coli. I put him on Augmentin but he has not started it yet. We are also going to send this for compounded antibiotics at [facility]. There is no evidence of systemic infection 9/29; silver alginate. His Keystone arrived. He is completing Augmentin in 2 days. Offloading in a cam boot. Moderate drainage per our intake staff 10/5; using silver alginate. He has been using his [place]. He has completed his Augmentin. Per our intake nurse still a lot of drainage, far too much to consider a total contact cast. Wound measures about the same. He had the same undermining area that I defined last week from a roughly 11-3. I remove this today 10/12; using silver  alginate he is using the [place]. He comes in for a nurse visit hence we are applying [employer] twice a week. Measuring slightly better today and less notable drainage. Extensive debridement of the wound edge last time 10/18; using topical Keystone and silver alginate and a soft cast. Wound measurements about the same. Drainage was through his soft cast. We are changing this twice a week Tuesdays and Friday 10/25; comes in with moderate drainage. Still using Keystone silver alginate and a soft cast. Wound dimensions completely the same.He has a lot of edema in the left leg he has lymphedema. Asking for [name] to consider wrapping him as he cannot get his stocking on over the soft cast 11/2; comes in with moderate to large drainage slightly smaller in terms of width we have been using [place]. His wound looks satisfactory but not much improvement 11/4; patient presents today for obligatory cast change. Has no issues or complaints today. He denies signs of infection. 11/9; patient traveled this weekend to [place], was on the cast quite a bit. Staining of the cast with black material from his walking boot. Drainage was not quite as bad as we feared. Using silver alginate and Keystone 11/16; we do not have size for cast therefore we have been putting a soft cast on him since the change on Friday. Still a significant amount of drainage necessitating changing twice a week. We have been using the Keystone at cast changes either hard or soft as well as silver alginate Comes in the clinic with things actually looking fairly good improvement in width. He says his offloading is about the same 02/24/2021 upon evaluation today patient actually comes back in and is doing excellent in regard to his foot ulcer this is significantly smaller even compared to the last visit. The soft cast seems to have done extremely well for him which is great news. I do not see any signs of infection minimal debridement will be needed  today. 11/30; left lateral foot much improved half a centimeter improvement in surface area. No evidence of infection. He seems to be doing better with the soft cast in the TCC therefore we will continue with this. He comes back in later in the week for a change with the nurses. This is due to drainage 12/6; no improvement in dimensions. Under illumination some debris on the surface we have been using silver alginate, soft cast. If there is anything optimistic here he seems to have have less drainage 12/13. Dimensions are improved both length and width and slightly in depth. Appears to be quite healthy today. Raised edges of this thick skin and callus around the edges however. He is in a soft cast were bringing him back once for a change on Friday.  Drainage is better 12/20. Dimensions are improved. He still has raised edges of thick skin and callus around the edges. We are using a soft cast 12/28; comes in today with thick callus around the wound. Using silver under alginate under a soft cast. I do not think there is much improvement in any measurement 2023 04/06/2021; patient was put in a total contact cast. Unfortunately not much change in surface area 1/10; not much different still thick callus and skin around the edge in spite of the total contact cast. This was just debrided last week we have been using the [facility] compounded antibiotic and silver alginate under a total contact cast 1/18 the patient's wound on the left side is doing nicely. smaller HOWEVER he comes in today with a wound on the right foot laterally. blister most likely serosangquenous drainage 1/24; the patient continues to do well in terms of the plantar left foot which is continued to contract using silver alginate under the total contact cast HOWEVER the right lateral foot is bigger with denuded skin around the edges. I used pickups and a #15 scalpel to remove this this looks like the remanence of a large blister. Cannot rule  out infection. Culture in this area I did last week showed Staphylococcus lugdunensis few colonies. I am going to try to address this with his [employer] antibiotic that is done so well on the left having linezolid and this should cover the staph 2/1; the patient's wound on his left foot which was the original plantar foot wound thick skin and eschar around the edges even in the total contact cast but the wound surface does not look too bad The real problem is on how his right lateral foot at roughly the base of the fifth metatarsal. The wound is completely necrotic more worrisome than that there is swelling around the edges of this. We have been using silver alginate on both wounds and Keystone on the right foot. Unfortunately I think he is going to require systemic antibiotics while we await cultures. He did not get the x-ray done that we ordered last week [lost the prescription 2/7; disappointingly in the area on the left foot which we are treating with a total contact cast is still not closed although it is much smaller. He continues to have a lot of callus around the wound edge. -Right lateral foot culture I did last week was negative x-ray also negative for osteomyelitis. 2/15: TCC silver alginate on the left and silver alginate on the right lateral. No real improvement in either area 05/26/2021: T oday, the wounds are roughly the same size as at his previous visit, post-debridement. He continues to endorse fairly substantial drainage, particularly on the right. He has been in a total contact cast on the left. There is still some callus surrounding this lesion. On the right, the periwound skin is quite macerated, along with surrounding callus. The center of the right-sided wound also has some dark, densely adherent material, which is very difficult to remove. 06/02/2021: Today, both wounds are slightly smaller. He has been using zinc oxide ointment around the right ulcer and the degree of maceration  has improved markedly. There continues to be an area of nonviable tissue in the center of the right sided ulcer. The left-sided wound, which has been in the total contact cast. Appears clean and the degree of callus around it is less than previously. 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 He had to take the stairs and both wounds have increased in size. The left foot, which has been in a total contact cast, has developed a tunnel tracking to the lateral aspect of his foot. The nonviable tissue in the center of the right-sided ulcer remains recalcitrant to debridement. There is significant undermining surrounding the entirety of the left sided wound. 06/16/2021: The elevator at school has been fixed and the patient has been able to avoid putting as much weight on his wounds over the past week. We converted the left foot wound into a single lesion today, but despite this, the wound is actually smaller. The base is healthy with limited periwound callus. On the right, the central necrotic area is still present. He continues to be quite macerated around the right sided wound, despite applying barrier cream. This does, however, have the benefit of softening the callus to make it more easily removable. 06/23/2021: Today, the left wound is smaller. The lateral aspect that had opened up previously is now closed. The wound base has a healthy bed of granulation tissue and minimal slough. Unfortunately, on the right, the wound is larger and continues to be fairly macerated. He has also reopened the wound at his right ankle. He thinks this is due to the gait he has adopted secondary to his total contact cast and boot. 06/30/2021: T oday, both wounds are a little bit larger. The lateral aspect on the left has remained closed. He continues to have significant periwound maceration. The culture that I took from the right sided wound grew a population of  bacteria that is not covered by his current [facility] antibiotic. The center of the right- sided wound continues to appear necrotic with nonviable fat. It probes deeper today, but does not reach bone. 07/07/2021: The periwound maceration is a little bit less today. The right lateral foot wound has some areas that appear more viable and the necrotic center also looks a little bit better. The wound on the dorsal surface of his right foot near the ankle is contracting and the surface appears healthy. The left plantar wound surface looks healthy, but there is some new undermining on the medial portion. He did get his new Keystone antibiotic and began applying that to the right foot wound on Saturday. 07/14/2021: The intake nurse reported substantial drainage from his wounds, but the periwound skin actually looks better than is typical for him. The wound on the dorsal surface of his right foot near the ankle is smaller and just has a small open area underneath some dried eschar. The left plantar wound surface looks healthy and there has been no significant accumulation of callus. The right lateral foot wound looks quite a bit better, with the central portion, which typically appears necrotic, looking more viable albeit pale. 07/22/2021: His left foot is extremely macerated today. The wound is about the same size. The wound on the dorsal surface of his right foot near the ankle had closed, but he traumatized it removing the dressing and there is a tiny skin tear in that location. The right lateral foot wound is bigger, but the surface appears healthy. 07/30/2021: The wound on the dorsal surface of his right foot near the ankle is closed. The right lateral foot wound again is a little bit bigger due to some undermining. The periwound skin is in better condition, however. He has been applying zinc oxide. The wound surface is a little bit dry today. On the left, he does not have  the substantial maceration that we  frequently see. The wound itself is smaller and has a clean surface. 08/06/2021: Both wounds seem to have deteriorated over the past week. The right lateral foot wound has a dry surface but the periwound is boggy.. Overall wound dimensions are about the same. On the left, the wound is about the same size, but there is more undermining present underneath periwound callus. 08/13/2021: The right sided wound looks about the same, but on the left there has been substantial deterioration. The undermining continues to extend under periwound callus. Once this was removed, substantial extension of the wound was present. There is no odor or purulent drainage but clearly the wounds have broken down. 08/20/2021: The wounds look about the same today. He has been out of his total contact cast and has just been changing the dressings using topical Keystone with PolyMem Ag, Kerlix and Ace bandages. The wound on the top of his right ankle has reopened but this is quite small. There was a little bit of purulent material that I expressed when examining this wound. 08/24/2021: After the aggressive debridement I performed at his last visit, the wounds actually look a little bit better today. They are smaller with the exception of the wound on the top of his right ankle which is a little bit bigger as some more skin pulled off when he was changing his dressing. We are using topical Keystone with PolyMem Ag Kerlix and Ace bandages. 09/02/2021: There has been really no change to any of his wounds. Electronic Signature(s) Signed: 09/02/2021 4:44:24 PM By: [name] MD FACS Entered By: [name] on 09/02/2021 16:44:24 -------------------------------------------------------------------------------- Physical Exam Details Patient Name: Date of Service: [name], [name] 09/02/2021 3:15 PM Medical Record Number: [id number] Patient Account Number: [id number] Date of Birth/Sex: Treating RN: Dec 12, 1985 (36 y.o. [name] Primary Care Provider: [name] Other Clinician: Referring Provider: Treating Provider/Extender: [name] in Treatment: 20 Constitutional Slightly hypertensive. . . . No acute distress. Respiratory Normal work of breathing on room air. Notes 09/02/2021: There is really been no change to any of his wounds. He continues to accumulate periwound callus, although not to the extent that he was while he was wearing the total contact cast. He continues to have dead fat in the right sided wound. There is additional undermining from 10-12 o'clock on the left foot wound. Electronic Signature(s) Signed: 09/02/2021 4:46:17 PM By: [name] MD FACS Entered By: [name] on 09/02/2021 16:46:16 -------------------------------------------------------------------------------- Physician Orders Details Patient Name: Date of Service: [name], [name] 09/02/2021 3:15 PM Medical Record Number: [id number] Patient Account Number: [id number] Date of Birth/Sex: Treating RN: 12/12/1985 (36 y.o. 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 - Dr. [name] - Room 4 - Thursday 6/8 at 3:15 Return A Bathing/ Shower/ Hygiene May shower with protection but do not get wound dressing(s) wet. - Use Cast Protector Bag on Right and left legs Edema Control - Lymphedema / SCD / Other Bilateral Lower  Extremities Elevate legs  to the level of the heart or above for 30 minutes daily and/or when sitting, a frequency of: - throughout the day Avoid standing for long periods of time. Exercise regularly Compression stocking or Garment 20-30 mm/Hg pressure to: - to right leg daily Off-Loading Open toe surgical shoe to: - right foot Other: - Soft cast to left foot Additional Orders / Instructions Follow Nutritious Diet Wound Treatment Wound #11 - Foot Wound Laterality: Right, Anterior Cleanser: Soap and Water 1 x Per Day/30 Days Discharge Instructions: May shower and wash wound with dial antibacterial soap and water prior to dressing change. Cleanser: Byram Ancillary Kit - 15 Day Supply (DME) (Generic) 1 x Per Day/30 Days Discharge Instructions: Use supplies as instructed; Kit contains: (15) Saline Bullets; (15) 3x3 Gauze; 15 pr Gloves Topical: keystone antibiotic compound 1 x Per Day/30 Days Prim Dressing: PolyMem Silver Non-Adhesive Dressing, 4.25x4.25 in (DME) (Generic) 1 x Per Day/30 Days ary Discharge Instructions: Apply to wound bed as instructed Secondary Dressing: ABD Pad, 8x10 (DME) (Generic) 1 x Per Day/30 Days Discharge Instructions: Apply over primary dressing as directed. Secondary Dressing: Optifoam Non-Adhesive Dressing, 4x4 in (DME) (Generic) 1 x Per Day/30 Days Discharge Instructions: Apply over primary dressing as directed. Secondary Dressing: Zetuvit Plus 4x8 in (DME) (Generic) 1 x Per Day/30 Days Discharge Instructions: Apply over primary dressing as directed. Secondary Dressing: NonWoven Sponge, 4x4 (in/in) (DME) (Generic) 1 x Per Day/30 Days Secured With: Coban Self-Adherent Wrap 4x5 (in/yd) (DME) (Generic) 1 x Per Day/30 Days Discharge Instructions: Secure with Coban as directed. Secured With: [employer], 4.5x3.1 (in/yd) (DME) (Generic) 1 x Per Day/30 Days Discharge Instructions: Secure with Kerlix as directed. Secured With: 27M Medipore H Soft Cloth Surgical T  ape, 4 x 10 (in/yd) (DME) (Generic) 1 x Per Day/30 Days Discharge Instructions: Secure with tape as directed. Wound #3 - Foot Wound Laterality: Plantar, Left, Lateral Cleanser: Soap and Water 1 x Per Day/15 Days Discharge Instructions: May shower and wash wound with dial antibacterial soap and water prior to dressing change. Cleanser: Byram Ancillary Kit - 15 Day Supply (DME) (Generic) 1 x Per Day/15 Days Discharge Instructions: Use supplies as instructed; Kit contains: (15) Saline Bullets; (15) 3x3 Gauze; 15 pr Gloves Topical: keystone antibiotic compound 1 x Per Day/15 Days Prim Dressing: PolyMem Silver Non-Adhesive Dressing, 4.25x4.25 in (DME) (Generic) 1 x Per Day/15 Days ary Discharge Instructions: Apply to wound bed as instructed Secondary Dressing: ABD Pad, 8x10 (DME) (Generic) 1 x Per Day/15 Days Discharge Instructions: Apply over primary dressing as directed. Secondary Dressing: Optifoam Non-Adhesive Dressing, 4x4 in (DME) (Generic) 1 x Per Day/15 Days Discharge Instructions: Apply over primary dressing as directed. Secondary Dressing: Zetuvit Plus 4x8 in (DME) (Generic) 1 x Per Day/15 Days Discharge Instructions: Apply over primary dressing as directed. Secondary Dressing: NonWoven Sponge, 4x4 (in/in) (DME) (Generic) 1 x Per Day/15 Days Secured With: Coban Self-Adherent Wrap 4x5 (in/yd) (DME) (Generic) 1 x Per Day/15 Days Discharge Instructions: Secure with Coban as directed. Secured With: [employer], 4.5x3.1 (in/yd) (DME) (Generic) 1 x Per Day/15 Days Discharge Instructions: Secure with Kerlix as directed. Secured With: 27M Medipore H Soft Cloth Surgical T ape, 4 x 10 (in/yd) (DME) (Generic) 1 x Per Day/15 Days Discharge Instructions: Secure with tape as directed. Wound #8 - Foot Wound Laterality: Right, Lateral Cleanser: Soap and Water 1 x Per LTJ/03 Days Discharge Instructions: May shower and wash wound with dial antibacterial soap and water prior to dressing  change. Cleanser: Byram Ancillary Kit -  15 Day Supply (DME) (Generic) 1 x Per Day/15 Days Discharge Instructions: Use supplies as instructed; Kit contains: (15) Saline Bullets; (15) 3x3 Gauze; 15 pr Gloves Topical: keystone antibiotic compound 1 x Per Day/15 Days Prim Dressing: PolyMem Silver Non-Adhesive Dressing, 4.25x4.25 in (DME) (Generic) 1 x Per Day/15 Days ary Discharge Instructions: Apply to wound bed as instructed Secondary Dressing: ABD Pad, 8x10 (DME) (Generic) 1 x Per Day/15 Days Discharge Instructions: Apply over primary dressing as directed. Secondary Dressing: Optifoam Non-Adhesive Dressing, 4x4 in (DME) (Generic) 1 x Per Day/15 Days Discharge Instructions: Apply over primary dressing as directed. Secondary Dressing: Zetuvit Plus 4x8 in (DME) (Generic) 1 x Per Day/15 Days Discharge Instructions: Apply over primary dressing as directed. Secondary Dressing: NonWoven Sponge, 4x4 (in/in) (DME) (Generic) 1 x Per Day/15 Days Secured With: Coban Self-Adherent Wrap 4x5 (in/yd) (DME) (Generic) 1 x Per Day/15 Days Discharge Instructions: Secure with Coban as directed. Secured With: [employer], 4.5x3.1 (in/yd) (DME) (Generic) 1 x Per Day/15 Days Discharge Instructions: Secure with Kerlix as directed. Secured With: 87M Medipore H Soft Cloth Surgical T ape, 4 x 10 (in/yd) (DME) (Generic) 1 x Per Day/15 Days Discharge Instructions: Secure with tape as directed. Electronic Signature(s) Signed: 09/02/2021 5:00:45 PM By: [name] MD FACS Signed: 09/02/2021 6:12:29 PM By: [name] RN, BSN Previous Signature: 09/02/2021 4:49:27 PM Version By: [name] MD FACS Entered By: [name] on 09/02/2021 16:49:29 -------------------------------------------------------------------------------- Problem List Details Patient Name: Date of Service: [name], [name] 09/02/2021 3:15 PM Medical Record Number: [id number] Patient Account Number: [id number] Date of  Birth/Sex: Treating RN: Sep 17, 1985 (36 y.o. 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 08/10/2020 No Yes Inactive Problems ICD-10 Code Description Active Date Inactive Date L97.518 Non-pressure chronic ulcer of other part of right foot with other specified severity 07/14/2020 07/14/2020 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, right ankle and foot 10/24/2019 10/24/2019 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, left ankle and foot 10/24/2019 10/24/2019 B95.62 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 10/24/2019 10/24/2019 classified elsewhere Resolved Problems Electronic Signature(s) Signed: 09/02/2021 4:43:02 PM By: [name] MD FACS Entered By: [name] on 09/02/2021 16:43:02 -------------------------------------------------------------------------------- Progress Note Details Patient Name: Date of Service: [name], [name] 09/02/2021 3:15 PM Medical Record Number: [id number] Patient Account Number: [id number] Date of Birth/Sex: Treating RN: 04-Jul-1985 (36 y.o. 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 He comes in for review of a wound over the plantar fifth metatarsal head extending into the lateral part of the foot. He was  followed for this previously by his podiatrist Dr. Cornelius Moras. As the patient tells his story he went to see podiatry first for a swelling he developed on the lateral part of his fifth metatarsal head in May. He states this was "open" by podiatry and the area closed. He was followed up in June and it was again opened callus removed and it closed promptly. There were plans being made for surgery on the fifth metatarsal head in June however his blood sugar was apparently too high for anesthesia. Apparently the area was debrided and opened again in June and it is never closed since. Looking over the records from podiatry I am really not able to follow this. It was clear when he was first seen it was before 5/14 at that point he already had a wound. By 5/17 the ulcer was resolved. I do not see anything about a procedure. On 5/28 noted to have pre-ulcerative moderate keratosis. X-ray noted 1/5 contracted toe and tailor's bunion and metatarsal deformity. On a visit date on 09/28/2018 the dorsal part of the left foot it healed and resolved. There was concern about swelling in his lower extremity he was sent to the ER.. As far as I can tell he was seen in the ER on 7/12 with an ulcer on his left foot. A DVT rule out of the left leg was negative. I do not think I have complete records from podiatry but I am not able to verify the procedures this patient states he had. He states after the last procedure the wound has never closed although I am not able to follow  this in the records I have from podiatry. He has not had a recent x-ray The patient has been using Neosporin on the wound. He is wearing a Darco shoe. He is still very active up on his foot working and exercising. Past medical history; type 2 diabetes ketosis-prone, leg swelling with a negative DVT study in July. Non-smoker ABI in our clinic was 0.85 on the left 10/16; substantial wound on the plantar left fifth met head extending laterally almost to the dorsal fifth MTP. We have been using silver alginate we gave him a Darco forefoot off loader. An x-ray did not show evidence of osteomyelitis did note soft tissue emphysema which I think was due to gas tracking through an open wound. There is no doubt in my mind he requires an MRI 10/23; MRI not booked until 3 November at the earliest this is largely due to his glucose sensor in the right arm. We have been using silver alginate. There has been an improvement 10/29; I am still not exactly sure when his MRI is booked for. He says it is the third but it is the 10th in epic. This definitely needs to be done. He is running a low-grade fever today but no other symptoms. No real improvement in the 1 02/26/2019 patient presents today for a follow-up visit here in our clinic he is last been seen in the clinic on October 29. Subsequently we were working on getting MRI to evaluate and see what exactly was going on and where we would need to go from the standpoint of whether or not he had osteomyelitis and again what treatments were going be required. Subsequently the patient ended up being admitted to the hospital on 02/07/2019 and was discharged on 02/14/2019. This is a somewhat interesting admission with a discharge diagnosis of pneumonia due to COVID-19 although he was positive for COVID-19 when tested  at the urgent care but negative x2 when he was actually in the hospital. With that being said he did have acute respiratory failure with hypoxia and it  was noted he also have a left foot ulceration with osteomyelitis. With that being said he did require oxygen for his pneumonia and I level 4 L. He was placed on antivirals and steroids for the COVID-19. He was also transferred to the [facility] at one point. Nonetheless he did subsequently discharged home and since being home has done much better in that regard. The CT angiogram did not show any pulmonary embolism. With regard to the osteomyelitis the patient was placed on vancomycin and Zosyn while in the hospital but has been changed to Augmentin at discharge. It was also recommended that he follow- up with wound care and podiatry. Podiatry however wanted him to see [name] according to the patient prior to them doing anything further. His hemoglobin A1c was 9.9 as noted in the hospital. Have an MRI of the left foot performed while in the hospital on 02/04/2019. 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 There is an overlying plantar open wound noted an abscess tracking back along the lateral aspect of the fifth metatarsal shaft. There is otherwise diffuse cellulitis and mild fasciitis without findings of polymyositis. The patient did have recently pneumonia secondary to COVID-19 I looked in the chart through epic and it does appear that the patient may need to have an additional x-ray just to ensure everything is cleared and that he has no airspace disease prior to putting him into the [name]. 03/05/2019; patient was readmitted to the clinic last week. He was hospitalized twice for a viral upper respiratory tract infection from 11/1 through 11/4 and then 11/5 through 11/12 ultimately this turned out to be Covid pneumonitis. Although he was discharged on oxygen he is not using it. He says he feels fine. He has no exercise limitation no cough no sputum. His O2 sat in our clinic today was 100% on room  air. 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 He received Vanco and Zosyn in the hospital and then was discharged on 2 weeks of Augmentin. I do not see any relevant cultures. He was supposed to follow-up with infectious disease but I do not see that he has an appointment. 12/8; patient saw Dr. Novella Olive of infectious disease last week. He felt that he had had adequate antibiotic therapy. He did not go to follow-up with Dr. Amalia Hailey of podiatry and I have again talked to him about the pros and cons of this. He does not want to consider a ray amputation of this time. He is aware of the risks of recurrence, migration etc. He started HBO today and tolerated this well. He can complete the Augmentin that I gave him last week. I have looked over the lab work that Dr. Chana Bode ordered his C-reactive protein was 3.3 and his sedimentation rate was 17. The C-reactive protein is never really been measurably that high in this patient 12/15; not much change in the wound today however he has undermining along the lateral part of the foot again more extensively than last week. He has some rims of epithelialization. We have been using silver alginate. He is undergoing hyperbarics but did not dive today 12/18; in for his  obligatory first total contact cast change. Unfortunately there was pus coming from the undermining area around his fifth metatarsal head. This was cultured but will preclude reapplication of a cast. He is seen in conjunction with HBO 12/24; patient had staph lugdunensis in the wound in the undermining area laterally last time. We put him on doxycycline which should have covered this. The wound looks better today. I am going to give him another week of doxycycline before reattempting the total contact cast 12/31; the patient is completing antibiotics. Hemorrhagic debris in the distal part of  the wound with some undermining distally. He also had hyper granulation. Extensive debridement with a #5 curette. The infected area that was on the lateral part of the fifth met head is closed over. I do not think he needs any more antibiotics. Patient was seen prior to HBO. Preparations for a total contact cast were made in the cast will be placed post hyperbarics 04/11/19; once again the patient arrives today without complaint. He had been in a cast all week noted that he had heavy drainage this week. This resulted in large raised areas of macerated tissue around the wound 1/14; wound bed looks better slightly smaller. Hydrofera Blue has been changing himself. He had a heavy drainage last week which caused a lot of maceration around the wound so I took him out of a total contact cast he says the drainage is actually better this week He is seen today in conjunction with HBO 1/21; returns to clinic. He was up in [place] for a day or 2 attending a funeral. He comes back in with the wound larger and with a large area of exposed bone. He had osteomyelitis and septic arthritis of the fifth left metatarsal head while he was in hospital. He received IV antibiotics in the hospital for a prolonged period of time then 3 weeks of Augmentin. Subsequently I gave him 2 weeks of doxycycline for more superficial wound infection. When I saw this last week the wound was smaller the surface of the wound looks satisfactory. 1/28; patient missed hyperbarics today. Bone biopsy I did last time showed Enterococcus faecalis and Staphylococcus lugdunensis . He has a wide area of exposed bone. We are going to use silver alginate as of today. I had another ethical discussion with the patient. 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 In this situation I think the likelihood of healing this is low. Therefore I have recommended a ray amputation and with the patient's agreement I have referred him  to Dr. Doran Durand. The other issue is that his compliance with hyperbarics has been minimal because of his work schedule and given his underlying decision I am going to stop this today READMISSION 10/24/2019 MRI 09/29/2019 left foot IMPRESSION: 1. Apparent skin ulceration inferior and lateral to the 5th metatarsal base with underlying heterogeneous T2 signal and enhancement in the subcutaneous fat. Small peripherally enhancing fluid collections along the plantar and lateral aspects of the 5th metatarsal base suspicious for abscesses. 2. Interval amputation through the mid 5th metatarsal with nonspecific low-level marrow edema and enhancement. Given the proximity to the adjacent soft tissue inflammatory changes, osteomyelitis cannot be excluded. 3. The additional bones appear unremarkable. MRI 09/29/2019 right foot IMPRESSION: 1. Soft tissue ulceration lateral to the 5th MTP joint. There is low-level T2 hyperintensity within the 4th and 5th metatarsal heads and adjacent proximal phalanges without abnormal T1 signal or cortical destruction. These findings are nonspecific and could be seen with  early marrow edema, hyperemia or early osteomyelitis. No evidence of septic joint. 2. Mild tenosynovitis and synovial enhancement associated with the extensor digitorum tendons at the level of the midfoot. 3. Diffuse low-level muscular T2 hyperintensity and enhancement, most consistent with diabetic myopathy. LEFT FOOT BONE Methicillin resistant staphylococcus aureus Staphylococcus lugdunensis MIC MIC CIPROFLOXACIN >=8 RESISTANT Resistant <=0.5 SENSI... Sensitive CLINDAMYCIN <=0.25 SENS... Sensitive >=8 RESISTANT Resistant ERYTHROMYCIN >=8 RESISTANT Resistant >=8 RESISTANT Resistant GENTAMICIN <=0.5 SENSI... Sensitive <=0.5 SENSI... Sensitive Inducible Clindamycin NEGATIVE Sensitive NEGATIVE Sensitive OXACILLIN >=4 RESISTANT Resistant 2 SENSITIVE Sensitive RIFAMPIN <=0.5 SENSI... Sensitive <=0.5  SENSI... Sensitive TETRACYCLINE <=1 SENSITIVE Sensitive <=1 SENSITIVE Sensitive TRIMETH/SULFA <=10 SENSIT Sensitive <=10 SENSIT Sensitive ... Marland Kitchen.. VANCOMYCIN 1 SENSITIVE Sensitive <=0.5 SENSI... Sensitive Right foot bone . Component 3 wk ago Specimen Description BONE Special Requests RIGHT 4 METATARSAL SAMPLE B Gram Stain NO WBC SEEN NO ORGANISMS SEEN Culture RARE METHICILLIN RESISTANT STAPHYLOCOCCUS AUREUS NO ANAEROBES ISOLATED Performed at [facility], [place] [address] [place], [place] [address] Report Status 10/08/2019 FINAL Organism ID, Bacteria METHICILLIN RESISTANT STAPHYLOCOCCUS AUREUS Resulting Agency [facility] Susceptibility Methicillin resistant staphylococcus aureus MIC CIPROFLOXACIN >=8 RESISTANT Resistant CLINDAMYCIN <=0.25 SENS... Sensitive ERYTHROMYCIN >=8 RESISTANT Resistant GENTAMICIN <=0.5 SENSI... Sensitive Inducible Clindamycin NEGATIVE Sensitive OXACILLIN >=4 RESISTANT Resistant RIFAMPIN <=0.5 SENSI... Sensitive TETRACYCLINE <=1 SENSITIVE Sensitive TRIMETH/SULFA <=10 SENSIT Sensitive ... VANCOMYCIN 1 SENSITIVE Sensitive This is a patient we had in clinic earlier this year with a wound over his left fifth metatarsal head. 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 Doran Durand on 05/16/2019. He seems to have developed problems with wounds on his bilateral feet in June 2021 although he may have had problems earlier than this. He was in an urgent care with a right foot ulcer on 09/26/2019 and given a course of doxycycline. This was apparently after having trouble getting into see orthopedics. He was seen by podiatry on 09/28/2019 noted to have bilateral lower extremity ulcers including  the left lateral fifth metatarsal base and the right subfifth met head. It was noted that had purulent drainage at that time. He required hospitalization from 6/20 through 7/2. This was because of worsening right foot wounds. He underwent bilateral operative incision and drainage and bone biopsies bilaterally. Culture results are listed above. He has been referred back to clinic by Dr. Jacqualyn Posey of podiatry. He is also followed by Dr. Megan Salon who saw him yesterday. He was discharged from hospital on Zyvox Flagyl and Levaquin and yesterday changed to doxycycline Flagyl and Levaquin. His inflammatory markers on 6/26 showed a sedimentation rate of 129 and a C-reactive protein of 5. This is improved to 14 and 1.3 respectively. This would indicate improvement. ABIs in our clinic today were 1.23 on the right and 1.20 on the left 11/01/2019 on evaluation today patient appears to be doing fairly well in regard to the wounds on his feet at this point. Fortunately there is no signs of active infection at this time. No fevers, chills, nausea, vomiting, or diarrhea. He currently is seeing infectious disease and still under their care at this point. Subsequently he also has both wounds which she has not been using collagen on as he did not receive that in his packaging he did not call us  and let us know that. Apparently that just was missed on the order. Nonetheless we will get that straightened out today. 8/9-Patient returns for bilateral foot wounds, using Prisma with hydrogel moistened dressings, and the wounds appear stable. Patient using surgical shoes, avoiding much pressure or weightbearing as much as possible 8/16; patient has bilateral foot wounds. 1 on the right lateral foot proximally the other is on the left mid lateral foot. Both required debridement of callus and thick skin around the wounds. We have been using silver collagen 8/27; patient has bilateral lateral foot wounds. The area on the left  substantially surrounded by callus and dry skin. This was removed from the wound edge. The underlying wound is small. The area on the right measured somewhat smaller today. 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 Unfortunately I did not update his antibiotics during today's visit. 9/10 I reviewed Dr. Hale Bogus last notes he felt he had completed antibiotics his inflammatory markers were reasonably well controlled. He has a small wound on the lateral left foot and a tiny area on the right which is just above closed. He is using Hydrofera Blue with border foam he has bilateral surgical shoes 9/24; 2 week f/u. doing well. right foot is closed. left foot still undermined. 10/14; right foot remains closed at the fifth met head. The area over the base of the left fifth metatarsal has a small open area but considerable undermining towards the plantar foot. Thick callus skin around this suggests an adequate pressure relief. We have talked about this. He says he is going to go back into his cam boot. I suggested a total contact cast he did not seem enamored with this suggestion 10/26; left foot base of the fifth metatarsal. Same condition as last time. He has skin over the area with an open wound however the skin is not adherent. He went to see Dr. Earleen Newport who did an x-ray and culture of his foot I have not reviewed the x-ray but the patient was not told anything. He is on doxycycline 11/11; since the patient was last here he was in the emergency room on 10/30 he was concerned about swelling in the left foot. They did not do any cultures or x-rays. They changed his antibiotics to cephalexin. Previous culture showed group B strep. The cephalexin is appropriate as doxycycline has less than predictable coverage. Arrives in clinic today with swelling over this area under the wound. He also has a new wound on the right fifth metatarsal  head 11/18; the patient has a difficult wound on the lateral aspect of the left fifth metatarsal head. The wound was almost ballotable last week I opened it slightly expecting to see purulence however there was just bleeding. I cultured this this was negative. X-ray unchanged. We are trying to get an MRI but I am not sure were going to be able to get this through his insurance. He also has an area on the right lateral fifth metatarsal head this looks healthier 12/3; the patient finally got our MRI. Surprisingly this did not show osteomyelitis. I did show the soft tissue ulceration at the lateral plantar aspect of the fifth metatarsal base with a tiny residual 6 mm abscess overlying the superficial fascia I have tried to culture this area I have not been able to get this to grow anything. Nevertheless the protruding tissue looks aggravated. I suspect we should try to treat the underlying "abscess with  broad-spectrum antibiotics. I am going to start him on Levaquin and Flagyl. He has much less edema in his legs and I am going to continue to wrap his legs and see him weekly 12/10. I started Levaquin and Flagyl on him last week. He just picked up the Flagyl apparently there was some delay. The worry is the wound on the left fifth metatarsal base which is substantial and worsening. His foot looks like he inverts at the ankle making this a weightbearing surface. Certainly no improvement in fact I think the measurements of this are somewhat worse. We have been using 12/17; he apparently just got the Levaquin yesterday this is 2 weeks after the fact. He has completed the Flagyl. The area over the left fifth metatarsal base still has protruding granulation tissue although it does not look quite as bad as it did some weeks ago. He has severe bilateral lymphedema although we have not been treating him for wounds on his legs this is definitely going to require compression. There was so much edema in the left I did  not wish to put him in a total contact cast today. I am going to increase his compression from 3-4 layer. The area on the right lateral fifth met head actually look quite good and superficial. 12/23; patient arrived with callus on the right fifth met head and the substantial hyper granulated callused wound on the base of his fifth metatarsal. He says he is completing his Levaquin in 2 days but I do not think that adds up with what I gave him but I will have to double check this. We are using Hydrofera Blue on both areas. My plan is to put the left leg in a cast the week after New Year's 04/06/2020; patient's wounds about the same. Right lateral fifth metatarsal head and left lateral foot over the base of the fifth metatarsal. There is undermining on the left lateral foot which I removed before application of total contact cast continuing with Hydrofera Blue new. Patient tells me he was seen by endocrinology today lab work was done [Dr. Kerr]. Also wondering whether he was referred to cardiology. I went over some lab work from previously does not have chronic renal failure certainly not nephrotic range proteinuria he does have very poorly controlled diabetes but this is not his most updated lab work. Hemoglobin A1c has been over 11 1/10; the patient had a considerable amount of leakage towards mid part of his left foot with macerated skin however the wound surface looks better the area on the right lateral fifth met head is better as well. I am going to change the dressing on the left foot under the total contact cast to silver alginate, continue with Hydrofera Blue on the right. 1/20; patient was in the total contact cast for 10 days. Considerable amount of drainage although the skin around the wound does not look too bad on the left foot. The area on the right fifth metatarsal head is closed. Our nursing staff reports large amount of drainage out of the left lateral foot wound 1/25; continues with  copious amounts of drainage described by our intake staff. PCR culture I did last week showed E. coli and Enterococcus faecalis and low quantities. Multiple resistance genes documented including extended spectrum beta lactamase, MRSA, MRSE, quinolone, tetracycline. The wound is not quite as good this week as it was 5 days ago but about the same size 2/3; continues with copious amounts of malodorous drainage per our intake  nurse. The PCR culture I did 2 weeks ago showed E. coli and low quantities of Enterococcus. There were multiple resistance genes detected. I put Neosporin on him last week although this does not seem to have helped. The wound is slightly deeper today. Offloading continues to be an issue here although with the amount of drainage she has a total contact cast is just not going to work 2/10; moderate amount of drainage. Patient reports he cannot get his stocking on over the dressing. I told him we have to do that the nurse gave him suggestions on how to make this work. The wound is on the bottom and lateral part of his left foot. Is cultured predominantly grew low amounts of Enterococcus, E. coli and anaerobes. There were multiple resistance genes detected including extended spectrum beta lactamase, quinolone, tetracycline. I could not think of an easy oral combination to address this so for now I am going to do topical antibiotics provided by [facility] I think the main agents here are vancomycin and an aminoglycoside. We have to be able to give him access to the wounds to get the topical antibiotic on 2/17; moderate amount of drainage this is unchanged. He has his Keystone topical antibiotic against the deep tissue culture organisms. He has been using this and changing the dressing daily. Silver alginate on the wound surface. 2/24; using Keystone antibiotic with silver alginate on the top. He had too much drainage for a total contact cast at one point although I think that is  improving and I think in the next week or 2 it might be possible to replace a total contact cast I did not do this today. In general the wound surface looks healthy however he continues to have thick rims of skin and subcutaneous tissue around the wide area of the circumference which I debrided 06/04/2020 upon evaluation today patient appears to be doing well in regard to his wound. I do feel like he is showing signs of improvement. There is little bit of callus and dead tissue around the edges of the wound as well as what appears to be a little bit of a sinus tract that is off to the side laterally I would perform debridement to clear that away today. 3/17; left lateral foot. The wound looks about the same as I remember. Not much depth surface looks healthy. No evidence of infection 3/25; left lateral foot. Wound surface looks about the same. Separating epithelium from the circumference. There really is no evidence of infection here however not making progress by my view 3/29; left lateral foot. Surface of the wound again looks reasonably healthy still thick skin and subcutaneous tissue around the wound margins. There is no evidence of infection. One of the concerns being brought up by the nurses has again the amount of drainage vis--vis continued use of a total contact cast 4/5; left lateral foot at roughly the base of the fifth metatarsal. Nice healthy looking granulated tissue with rims of epithelialization. The overall wound measurements are not any better but the tissue looks healthy. The only concern is the amount of drainage although he has no surrounding maceration with what we have been doing recently to absorb fluid and protect his skin. He also has lymphedema. 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 He states that the kerlix/coban under the TCC rolled down and could not get it back up. He has been using an offloading scooter and has somehow hurt his right foot using this device. This happened last week. He states that the side of his right foot developed a blister and opened. The top of his foot also has a few small open wounds he thinks is due to his socks rubbing in his shoes. He has not been using any dressings to the wound. He denies purulent drainage, fever/chills or erythema to the wounds. 4/22; patient presents for 1 week follow-up. He developed new wounds to the right foot that were evaluated at last clinic visit. He continues to have a total contact cast to the left leg and he reports no issues. He has been using silver collagen to the right foot wounds with no issues. He denies purulent drainage, fever/chills or erythema to the right foot wounds. He has no complaints today 4/25; patient presents for 1 week follow-up. He has a total contact cast of the left leg and reports no issues. He has been using silver alginate to the right foot wound. He denies purulent drainage, fever/chills or erythema to the right foot wounds. 5/2 patient presents for 1 week follow-up. T contact cast on the left. The wound which is on the base of the plantar foot at the base of the fifth metatarsal otal actually looks quite good and dimensions continue to gradually contract. HOWEVER the area on the right lateral fifth metatarsal head is much larger than what I remember from 2 weeks ago. Once more is he has significant levels of hypergranulation. Noteworthy that he had this same hyper granulated response on his wound on the left foot at one point in time. So much so that he I thought there was an underlying fluid collection. Based on this I think this just needs debridement. 5/9; the wound on the left actually continues to be gradually smaller with a  healthy surface. Slight amount of drainage and maceration of the skin around but not too bad. However he has a large wound over the right fifth metatarsal head very much in the same configuration as his left foot wound was initially. I used silver nitrate to address the hyper granulated tissue no mechanical debridement 5/16; area on the left foot did not look as healthy this week deeper thick surrounding macerated skin and subcutaneous tissue. oo The area on the right foot fifth met head was about the same oo The area on the right ankle that we identified last week is completely broken down into an open wound presumably a stocking rubbing issue 5/23; patient has been using a total contact cast to the left side. He has been using silver alginate underneath. He has also been using silver alginate to the right foot wounds. He has no complaints today. He denies any signs of infection. 5/31; the left-sided wound looks some better measure smaller surface granulation looks better. We have been using silver alginate under the total contact cast oo The large area on his right fifth met head and right dorsal foot look about the same still using silver alginate 6/6; neither side is good as I was hoping although the surface area dimensions are better. A lot of maceration on his left and right foot around the wound edge. Area on the dorsal right foot looks better. He says he was traveling. I am not sure what  does the amount of maceration around the plantar wounds may be drainage issues 6/13; in general the wound surfaces look quite good on both sides. Macerated skin and raised edges around the wound required debridement although in general especially on the left the surface area seems improved. oo The area on the right dorsal ankle is about the same I thought this would not be such a problem to close 6/20; not much change in either wound although the one on the right looks a little better. Both wounds have  thick macerated edges to the skin requiring debridements. We have been using silver alginate. The area on the dorsal right ankle is still open I thought this would be closed. 6/28; patient comes in today with a marked deterioration in the right foot wound fifth met head. Wide area of exposed bone this is a drastic change from last time. The area on the left there we have been casting is stagnant. We have been using silver alginate in both wound areas. 7/5; bone culture I did for PCR last time was positive for Pseudomonas, group B strep, Enterococcus and Staph aureus. There was no suggestion of methicillin resistance or ampicillin resistant genes. This was resistant to tetracycline however He comes into the clinic today with the area over his right plantar fifth metatarsal head which had been doing so well 2 weeks ago completely necrotic feeling bone. I do not know that this is going to be salvageable. The left foot wound is certainly no smaller but it has a better surface and is superficial. 7/8; patient called in this morning to say that his total contact cast was rubbing against his foot. He states he is doing fine overall. He denies signs of infection. 7/12; continued deterioration in the wound over the right fifth metatarsal head crumbling bone. This is not going to be salvageable. The patient agrees and wants to be referred to Dr. Doran Durand which we will attempt to arrange as soon as possible. I am going to continue him on antibiotics as long as that takes so I will renew those today. The area on the left foot which is the base of the fifth metatarsal continues to look somewhat better. Healthy looking tissue no depth no debridement is necessary here. 7/20; the patient was kindly seen by Dr. Doran Durand of orthopedics on 10/19/2020. 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 Towards this end we have taken him  out of the total contact cast on the left we will put him in a wrap with Hydrofera Blue. As I understand things surgery is planned for 7/21 7/27; patient had his surgery last Thursday. 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 Apparently everything went well we did not still disturb that today The area on the left foot actually looks quite good. He has been much less mobile which probably explains this he did not seem to do well in the total contact cast secondary to drainage and maceration I think. 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 Fortunately there is no evidence of active infection at this time. No fevers, chills, nausea, vomiting, or diarrhea. Overall I think that he is actually doing extremely well. Nonetheless I do believe that he is staying off of this more following the surgery in his  right foot that is the reason the left is doing so great. 8/16; left plantar foot wound. This looks smaller than the last time I saw this he is using Hydrofera Blue. The surgical wound on the right foot is being followed by Dr. Doran Durand we did not look at this today. He has surgical shoes on both feet 8/23; left plantar foot wound not as good this week. Surrounding macerated skin and subcutaneous tissue everything looks moist and wet. I do not think he is offloading this adequately. He is using a surgical shoe Apparently the right foot surgical wound is not open although I did not check his foot 8/31; left plantar foot lateral aspect. Much improved this week. He has no maceration. Some improvement in the surface area of the wound but most impressively the depth is come in we are using silver alginate. The patient is a Product/process development scientist. He is asked that we write him a letter so he can go back to work. I have also tried to see if we can write something that will allow him to limit the amount  of time that he is on his foot at work. 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 Hopefully the latter can be avoided 9/6; I believe the patient missed an appointment last week. He arrives in today with a wound looking roughly the same certainly no better. Undermining laterally and also inferiorly. We used molecuLight today in training with the patient's permission.. We are using silver alginate 9/21 wound is measuring bigger this week although this may have to do with the aggressive circumferential debridement last week in response to the blush fluorescence on the MolecuLight. Culture I did last week showed significant MSSA and E. coli. I put him on Augmentin but he has not started it yet. We are also going to send this for compounded antibiotics at [facility]. There is no evidence of systemic infection 9/29; silver alginate. His Keystone arrived. He is completing Augmentin in 2 days. Offloading in a cam boot. Moderate drainage per our intake staff 10/5; using silver alginate. He has been using his [place]. He has completed his Augmentin. Per our intake nurse still a lot of drainage, far too much to consider a total contact cast. Wound measures about the same. He had the same undermining area that I defined last week from a roughly 11-3. I remove this today 10/12; using silver alginate he is using the [place]. He comes in for a nurse visit hence we are applying [employer] twice a week. Measuring slightly better today and less notable drainage. Extensive debridement of the wound edge last time 10/18; using topical Keystone and silver alginate and a soft cast. Wound measurements about the same. Drainage was through his soft cast. We are changing this twice a week Tuesdays and Friday 10/25; comes in with moderate drainage. Still using Keystone silver alginate and a soft cast. Wound dimensions completely the same.He has a lot of  edema in the left leg he has lymphedema. Asking for [name] to consider wrapping him as he cannot get his stocking on over the soft cast 11/2; comes in with moderate to large drainage slightly smaller in terms of width we have been using [place]. His wound looks satisfactory but not much improvement 11/4; patient presents today for obligatory cast change. Has no issues or complaints today. He denies signs of infection. 11/9; patient traveled this weekend to  [place], was on the cast quite a bit. Staining of the cast with black material from his walking boot. Drainage was not quite as bad as we feared. Using silver alginate and Keystone 11/16; we do not have size for cast therefore we have been putting a soft cast on him since the change on Friday. Still a significant amount of drainage necessitating changing twice a week. We have been using the Keystone at cast changes either hard or soft as well as silver alginate Comes in the clinic with things actually looking fairly good improvement in width. He says his offloading is about the same 02/24/2021 upon evaluation today patient actually comes back in and is doing excellent in regard to his foot ulcer this is significantly smaller even compared to the last visit. The soft cast seems to have done extremely well for him which is great news. I do not see any signs of infection minimal debridement will be needed today. 11/30; left lateral foot much improved half a centimeter improvement in surface area. No evidence of infection. He seems to be doing better with the soft cast in the TCC therefore we will continue with this. He comes back in later in the week for a change with the nurses. This is due to drainage 12/6; no improvement in dimensions. Under illumination some debris on the surface we have been using silver alginate, soft cast. If there is anything optimistic here he seems to have have less drainage 12/13. Dimensions are improved both length and width and  slightly in depth. Appears to be quite healthy today. Raised edges of this thick skin and callus around the edges however. He is in a soft cast were bringing him back once for a change on Friday. Drainage is better 12/20. Dimensions are improved. He still has raised edges of thick skin and callus around the edges. We are using a soft cast 12/28; comes in today with thick callus around the wound. Using silver under alginate under a soft cast. I do not think there is much improvement in any measurement 2023 04/06/2021; patient was put in a total contact cast. Unfortunately not much change in surface area 1/10; not much different still thick callus and skin around the edge in spite of the total contact cast. This was just debrided last week we have been using the [facility] compounded antibiotic and silver alginate under a total contact cast 1/18 the patient's wound on the left side is doing nicely. smaller HOWEVER he comes in today with a wound on the right foot laterally. blister most likely serosangquenous drainage 1/24; the patient continues to do well in terms of the plantar left foot which is continued to contract using silver alginate under the total contact cast HOWEVER the right lateral foot is bigger with denuded skin around the edges. I used pickups and a #15 scalpel to remove this this looks like the remanence of a large blister. Cannot rule out infection. Culture in this area I did last week showed Staphylococcus lugdunensis few colonies. I am going to try to address this with his [employer] antibiotic that is done so well on the left having linezolid and this should cover the staph 2/1; the patient's wound on his left foot which was the original plantar foot wound thick skin and eschar around the edges even in the total contact cast but the wound surface does not look too bad The real problem is on how his right lateral foot at roughly the base of the fifth  metatarsal. The wound is completely  necrotic more worrisome than that there is swelling around the edges of this. We have been using silver alginate on both wounds and Keystone on the right foot. Unfortunately I think he is going to require systemic antibiotics while we await cultures. He did not get the x-ray done that we ordered last week [lost the prescription 2/7; disappointingly in the area on the left foot which we are treating with a total contact cast is still not closed although it is much smaller. He continues to have a lot of callus around the wound edge. -Right lateral foot culture I did last week was negative x-ray also negative for osteomyelitis. 2/15: TCC silver alginate on the left and silver alginate on the right lateral. No real improvement in either area 05/26/2021: T oday, the wounds are roughly the same size as at his previous visit, post-debridement. He continues to endorse fairly substantial drainage, particularly on the right. He has been in a total contact cast on the left. There is still some callus surrounding this lesion. On the right, the periwound skin is quite macerated, along with surrounding callus. The center of the right-sided wound also has some dark, densely adherent material, which is very difficult to remove. 06/02/2021: Today, both wounds are slightly smaller. He has been using zinc oxide ointment around the right ulcer and the degree of maceration has improved markedly. There continues to be an area of nonviable tissue in the center of the right sided ulcer. The left-sided wound, which has been in the total contact cast. Appears clean and the degree of callus around it is less than previously. 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 He had to take the stairs and both wounds have increased in size. The left foot, which has been in a total contact cast, has developed a tunnel tracking to the lateral aspect of his foot. The nonviable  tissue in the center of the right-sided ulcer remains recalcitrant to debridement. There is significant undermining surrounding the entirety of the left sided wound. 06/16/2021: The elevator at school has been fixed and the patient has been able to avoid putting as much weight on his wounds over the past week. We converted the left foot wound into a single lesion today, but despite this, the wound is actually smaller. The base is healthy with limited periwound callus. On the right, the central necrotic area is still present. He continues to be quite macerated around the right sided wound, despite applying barrier cream. This does, however, have the benefit of softening the callus to make it more easily removable. 06/23/2021: Today, the left wound is smaller. The lateral aspect that had opened up previously is now closed. The wound base has a healthy bed of granulation tissue and minimal slough. Unfortunately, on the right, the wound is larger and continues to be fairly macerated. He has also reopened the wound at his right ankle. He thinks this is due to the gait he has adopted secondary to his total contact cast and boot. 06/30/2021: T oday, both wounds are a little bit larger. The lateral aspect on the left has remained closed. He continues to have significant periwound maceration. The culture that I took from the right sided wound grew a population of bacteria that is not covered by his current [facility] antibiotic. The center of the right- sided wound continues to appear necrotic with nonviable fat. It probes deeper today, but  does not reach bone. 07/07/2021: The periwound maceration is a little bit less today. The right lateral foot wound has some areas that appear more viable and the necrotic center also looks a little bit better. The wound on the dorsal surface of his right foot near the ankle is contracting and the surface appears healthy. The left plantar wound surface looks healthy, but there is  some new undermining on the medial portion. He did get his new Keystone antibiotic and began applying that to the right foot wound on Saturday. 07/14/2021: The intake nurse reported substantial drainage from his wounds, but the periwound skin actually looks better than is typical for him. The wound on the dorsal surface of his right foot near the ankle is smaller and just has a small open area underneath some dried eschar. The left plantar wound surface looks healthy and there has been no significant accumulation of callus. The right lateral foot wound looks quite a bit better, with the central portion, which typically appears necrotic, looking more viable albeit pale. 07/22/2021: His left foot is extremely macerated today. The wound is about the same size. The wound on the dorsal surface of his right foot near the ankle had closed, but he traumatized it removing the dressing and there is a tiny skin tear in that location. The right lateral foot wound is bigger, but the surface appears healthy. 07/30/2021: The wound on the dorsal surface of his right foot near the ankle is closed. The right lateral foot wound again is a little bit bigger due to some undermining. The periwound skin is in better condition, however. He has been applying zinc oxide. The wound surface is a little bit dry today. On the left, he does not have the substantial maceration that we frequently see. The wound itself is smaller and has a clean surface. 08/06/2021: Both wounds seem to have deteriorated over the past week. The right lateral foot wound has a dry surface but the periwound is boggy.. Overall wound dimensions are about the same. On the left, the wound is about the same size, but there is more undermining present underneath periwound callus. 08/13/2021: The right sided wound looks about the same, but on the left there has been substantial deterioration. The undermining continues to extend under periwound callus. Once this was  removed, substantial extension of the wound was present. There is no odor or purulent drainage but clearly the wounds have broken down. 08/20/2021: The wounds look about the same today. He has been out of his total contact cast and has just been changing the dressings using topical Keystone with PolyMem Ag, Kerlix and Ace bandages. The wound on the top of his right ankle has reopened but this is quite small. There was a little bit of purulent material that I expressed when examining this wound. 08/24/2021: After the aggressive debridement I performed at his last visit, the wounds actually look a little bit better today. They are smaller with the exception of the wound on the top of his right ankle which is a little bit bigger as some more skin pulled off when he was changing his dressing. We are using topical Keystone with PolyMem Ag Kerlix and Ace bandages. 09/02/2021: There has been really no change to any of his wounds. Patient History Information obtained from Patient. Family History No family history of Cancer, Diabetes, Hereditary Spherocytosis, Hypertension, Kidney Disease, Lung Disease, Seizures, Stroke, Thyroid Problems, Tuberculosis. Social History Never smoker, Marital Status - Single, Alcohol Use - Rarely,  Drug Use - No History, Caffeine Use - Never. Medical History Eyes Denies history of Cataracts, Glaucoma, Optic Neuritis Ear/Nose/Mouth/Throat Denies history of Chronic sinus problems/congestion, Middle ear problems Hematologic/Lymphatic Denies history of Anemia, Hemophilia, Human Immunodeficiency Virus, Lymphedema, Sickle Cell Disease Respiratory Denies history of Aspiration, Asthma, Chronic Obstructive Pulmonary Disease (COPD), Pneumothorax, Sleep Apnea, Tuberculosis Cardiovascular Denies history of Angina, Arrhythmia, Congestive Heart Failure, Coronary Artery Disease, Deep Vein Thrombosis, Hypertension, Hypotension, Myocardial Infarction, Peripheral Arterial Disease, Peripheral  Venous Disease, Phlebitis, Vasculitis Gastrointestinal Denies history of Cirrhosis , Colitis, Crohnoos, Hepatitis A, Hepatitis B, Hepatitis C Endocrine Patient has history of Type II Diabetes Denies history of Type I Diabetes Immunological Denies history of Lupus Erythematosus, Raynaudoos, Scleroderma Integumentary (Skin) Denies history of History of Burn Musculoskeletal Denies history of Gout, Rheumatoid Arthritis, Osteoarthritis, Osteomyelitis Neurologic Denies history of Dementia, Neuropathy, Quadriplegia, Paraplegia, Seizure Disorder Oncologic Denies history of Received Chemotherapy, Received Radiation Psychiatric Denies history of Anorexia/bulimia, Confinement Anxiety Hospitalization/Surgery History - 11/1-11/06/2018- sepsis foot infection. - 11/4-11/5 02 sats low respiratory distress. Objective Constitutional Slightly hypertensive. No acute distress. Vitals Time Taken: 3:48 PM, Height: 77 in, Weight: 280 lbs, BMI: 33.2, Temperature: 98.4 F, Pulse: 94 bpm, Respiratory Rate: 18 breaths/min, Blood Pressure: 149/88 mmHg, Capillary Blood Glucose: 115 mg/dl. General Notes: glucose per pt report Respiratory Normal work of breathing on room air. General Notes: 09/02/2021: There is really been no change to any of his wounds. He continues to accumulate periwound callus, although not to the extent that he was while he was wearing the total contact cast. He continues to have dead fat in the right sided wound. There is additional undermining from 10-12 o'clock on the left foot wound. Integumentary (Hair, Skin) Wound #11 status is Open. Original cause of wound was Gradually Appeared. The date acquired was: 08/20/2021. The wound has been in treatment 1 weeks. The wound is located on the [place]. The wound measures 1cm length x 1.8cm width x 0.1cm depth; 1.414cm^2 area and 0.141cm^3 volume. There is Fat Layer (Subcutaneous Tissue) exposed. There is no tunneling or undermining  noted. There is a medium amount of serosanguineous drainage noted. The wound margin is distinct with the outline attached to the wound base. There is large (67-100%) red, pink granulation within the wound bed. There is no necrotic tissue within the wound bed. Wound #3 status is Open. Original cause of wound was Trauma. The date acquired was: 10/02/2019. The wound has been in treatment 97 weeks. The wound is located on the [place]. The wound measures 3.8cm length x 4.1cm width x 0.4cm depth; 12.237cm^2 area and 4.895cm^3 volume. There is Fat Layer (Subcutaneous Tissue) exposed. There is no tunneling noted, however, there is undermining starting at 4:00 and ending at 6:00 with a maximum distance of 1.2cm. There is a medium amount of serosanguineous drainage noted. The wound margin is well defined and not attached to the wound base. There is large (67-100%) red, pink granulation within the wound bed. There is a small (1-33%) amount of necrotic tissue within the wound bed including Adherent Slough. Wound #8 status is Open. Original cause of wound was Blister. The date acquired was: 04/18/2021. The wound has been in treatment 19 weeks. The wound is located on the Right,Lateral Foot. The wound measures 4cm length x 3.3cm width x 1.2cm depth; 10.367cm^2 area and 12.441cm^3 volume. There is Fat Layer (Subcutaneous Tissue) exposed. There is no tunneling noted, however, there is undermining starting at 3:00 and ending at 5:00 with a maximum distance of  1cm. There is a large amount of serosanguineous drainage noted. The wound margin is well defined and not attached to the wound base. There is large (67- 100%) red, pink granulation within the wound bed. There is a small (1-33%) amount of necrotic tissue within the wound bed including Adherent Slough. 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 There was a Selective/Open Wound Non-Viable Tissue Debridement with a total area of 1.8 sq cm performed by [name], MD. With the following instrument(s): Curette to remove Non-Viable tissue/material. Material removed includes Eschar and Slough and. No specimens were taken. A time out was conducted at 16:16, prior to the start of the procedure. A Minimum amount of bleeding was controlled with Pressure. The procedure was tolerated well with a pain level of 0 throughout and a pain level of 0 following the procedure. Post Debridement Measurements: 1cm length x 1.8cm width x 0.1cm depth; 0.141cm^3 volume. Character of Wound/Ulcer Post Debridement is improved. Severity of Tissue Post Debridement is: Fat layer exposed. Post procedure Diagnosis Wound #11: Same as Pre-Procedure Wound #3 Pre-procedure diagnosis of Wound #3 is a Diabetic Wound/Ulcer of the Lower Extremity located on the Left,Lateral,Plantar Foot .Severity of Tissue Pre Debridement is: Fat layer exposed. There was a Selective/Open Wound Non-Viable Tissue Debridement with a total area of 15.58 sq cm performed by [name], MD. With the following instrument(s): Blade, Curette, and Forceps to remove Non-Viable tissue/material. Material removed includes Callus and Slough and. No specimens were taken. A time out was conducted at 16:16, prior to the start of the procedure. A Minimum amount of bleeding was controlled with Pressure. The procedure was tolerated well with a pain level of 0 throughout and a pain level of 0 following the procedure. Post Debridement  Measurements: 3.8cm length x 4.1cm width x 0.4cm depth; 4.895cm^3 volume. Character of Wound/Ulcer Post Debridement is improved. Severity of Tissue Post Debridement is: Fat layer exposed. 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 There was a Excisional Skin/Subcutaneous Tissue Debridement with a total area of 13.2 sq cm performed by [name], MD. With the following instrument(s): Curette to remove Non-Viable tissue/material. Material removed includes Fat and Slough and. No specimens were taken. A time out was conducted at 16:16, prior to the start of the procedure. A Minimum amount of bleeding was controlled with Pressure. The procedure was tolerated well with a pain level of 0 throughout and a pain level of 0 following the procedure. Post Debridement Measurements: 4cm length x 3.3cm width x 1.2cm depth; 12.441cm^3 volume. Character of Wound/Ulcer Post Debridement is improved. Severity of Tissue Post Debridement is: Fat layer exposed. Post procedure Diagnosis Wound #8: Same as Pre-Procedure Plan Follow-up Appointments: Return Appointment in 1 week. - Dr. [name] - Room 4 - Thursday 6/8 at 3:15 Bathing/ Shower/ Hygiene: May shower with protection but do not get wound dressing(s) wet. - Use Cast Protector Bag on Right and left legs Edema  Control - Lymphedema / SCD / Other: Elevate legs to the level of the heart or above for 30 minutes daily and/or when sitting, a frequency of: - throughout the day Avoid standing for long periods of time. Exercise regularly Compression stocking or Garment 20-30 mm/Hg pressure to: - to right leg daily Off-Loading: Open toe surgical shoe to: - right foot Other: - Soft cast to left foot Additional Orders / Instructions: Follow Nutritious Diet WOUND #11: -  Foot Wound Laterality: Right, Anterior Cleanser: Soap and Water 1 x Per BTD/17 Days Discharge Instructions: May shower and wash wound with dial antibacterial soap and water prior to dressing change. Cleanser: [place] Ancillary Kit - 15 Day Supply (Generic) 1 x Per Day/15 Days Discharge Instructions: Use supplies as instructed; Kit contains: (15) Saline Bullets; (15) 3x3 Gauze; 15 pr Gloves Topical: keystone antibiotic compound 1 x Per Day/15 Days Prim Dressing: PolyMem Silver Non-Adhesive Dressing, 4.25x4.25 in (Generic) 1 x Per Day/15 Days ary Discharge Instructions: Apply to wound bed as instructed Secondary Dressing: ABD Pad, 8x10 (Generic) 1 x Per Day/15 Days Discharge Instructions: Apply over primary dressing as directed. Secondary Dressing: Optifoam Non-Adhesive Dressing, 4x4 in (Generic) 1 x Per Day/15 Days Discharge Instructions: Apply over primary dressing as directed. Secondary Dressing: Zetuvit Plus 4x8 in 1 x Per Day/15 Days Discharge Instructions: Apply over primary dressing as directed. Secondary Dressing: NonWoven Sponge, 4x4 (in/in) (Generic) 1 x Per Day/15 Days Secured With: [place]an Self-Adherent Wrap 4x5 (in/yd) (Generic) 1 x Per Day/15 Days Discharge Instructions: Secure with [place] as directed. Secured With: [employer], 4.5x3.1 (in/yd) (Generic) 1 x Per Day/15 Days Discharge Instructions: Secure with Kerlix as directed. Secured With: 22M Medipore H Soft Cloth Surgical T ape, 4 x 10 (in/yd) (Generic) 1 x Per Day/15 Days Discharge Instructions: Secure with tape as directed. WOUND #3: - Foot Wound Laterality: Plantar, Left, Lateral Cleanser: Soap and Water 1 x Per Day/15 Days Discharge Instructions: May shower and wash wound with dial antibacterial soap and water prior to dressing change. Cleanser: [place]ram Ancillary Kit - 15 Day Supply (Generic) 1 x Per Day/15 Days Discharge Instructions: Use supplies as instructed; Kit contains: (15) Saline Bullets; (15) 3x3 Gauze; 15 pr  Gloves Topical: keystone antibiotic compound 1 x Per Day/15 Days Prim Dressing: PolyMem Silver Non-Adhesive Dressing, 4.25x4.25 in (Generic) 1 x Per Day/15 Days ary Discharge Instructions: Apply to wound bed as instructed Secondary Dressing: ABD Pad, 8x10 (Generic) 1 x Per Day/15 Days Discharge Instructions: Apply over primary dressing as directed. Secondary Dressing: Optifoam Non-Adhesive Dressing, 4x4 in (Generic) 1 x Per Day/15 Days Discharge Instructions: Apply over primary dressing as directed. Secondary Dressing: Zetuvit Plus 4x8 in 1 x Per Day/15 Days Discharge Instructions: Apply over primary dressing as directed. Secondary Dressing: NonWoven Sponge, 4x4 (in/in) (Generic) 1 x Per Day/15 Days Secured With: [place] Self-Adherent Wrap 4x5 (in/yd) (Generic) 1 x Per Day/15 Days Discharge Instructions: Secure with [place]an as directed. Secured With: [employer], 4.5x3.1 (in/yd) (Generic) 1 x Per Day/15 Days Discharge Instructions: Secure with Kerlix as directed. Secured With: 22M Medipore H Soft Cloth Surgical T ape, 4 x 10 (in/yd) (Generic) 1 x Per Day/15 Days Discharge Instructions: Secure with tape as directed. WOUND #8: - Foot Wound Laterality: Right, Lateral Cleanser: Soap and Water 1 x Per [place]/07 Days Discharge Instructions: May shower and wash wound with dial antibacterial soap and water prior to dressing change. Cleanser: [place]ram Ancillary Kit - 15 Day Supply (Generic) 1 x Per Day/15 Days Discharge Instructions: Use  supplies as instructed; Kit contains: (15) Saline Bullets; (15) 3x3 Gauze; 15 pr Gloves Topical: keystone antibiotic compound 1 x Per Day/15 Days Prim Dressing: PolyMem Silver Non-Adhesive Dressing, 4.25x4.25 in (Generic) 1 x Per Day/15 Days ary Discharge Instructions: Apply to wound bed as instructed Secondary Dressing: ABD Pad, 8x10 (Generic) 1 x Per Day/15 Days Discharge Instructions: Apply over primary dressing as directed. Secondary Dressing: Optifoam  Non-Adhesive Dressing, 4x4 in (Generic) 1 x Per Day/15 Days Discharge Instructions: Apply over primary dressing as directed. Secondary Dressing: Zetuvit Plus 4x8 in 1 x Per Day/15 Days Discharge Instructions: Apply over primary dressing as directed. Secondary Dressing: NonWoven Sponge, 4x4 (in/in) (Generic) 1 x Per Day/15 Days Secured With: Coban Self-Adherent Wrap 4x5 (in/yd) (Generic) 1 x Per Day/15 Days Discharge Instructions: Secure with Coban as directed. Secured With: [employer], 4.5x3.1 (in/yd) (Generic) 1 x Per Day/15 Days Discharge Instructions: Secure with Kerlix as directed. Secured With: 54M Medipore H Soft Cloth Surgical T ape, 4 x 10 (in/yd) (Generic) 1 x Per Day/15 Days Discharge Instructions: Secure with tape as directed. 09/02/2021: There is really been no change to any of his wounds. He continues to accumulate periwound callus, although not to the extent that he was while he was wearing the total contact cast. He continues to have dead fat in the right sided wound. There is additional undermining from 10-12 o'clock on the left foot wound. Used a curette to debride the necrotic fat from the right sided wound and a scalpel and forceps to debride the tissue forming the undermined area on the left wound. I also removed some eschar and slough from the dorsal foot wound on the right using a curette. We will continue using the topical Keystone antibiotic with PolyMem Ag for another week. We have really run through all of the major options available to him without any real progress. He will follow-up in 1 week's time. Electronic Signature(s) Signed: 09/02/2021 4:48:28 PM By: [name] MD FACS Entered By: [name] on 09/02/2021 16:48:28 -------------------------------------------------------------------------------- HxROS Details Patient Name: Date of Service: [name], [name] 09/02/2021 3:15 PM Medical Record Number: [id number] Patient Account Number:  [id number] Date of Birth/Sex: Treating RN: [birth year] (36 y.o. [name] Primary Care Provider: [name] Other Clinician: Referring Provider: Treating Provider/Extender: [name] in Treatment: 97 Information Obtained From Patient Eyes Medical History: Negative for: Cataracts; Glaucoma; Optic Neuritis Ear/Nose/Mouth/Throat Medical History: Negative for: Chronic sinus problems/congestion; Middle ear problems Hematologic/Lymphatic Medical History: Negative for: Anemia; Hemophilia; Human Immunodeficiency Virus; Lymphedema; Sickle Cell Disease Respiratory Medical History: Negative for: Aspiration; Asthma; Chronic Obstructive Pulmonary Disease (COPD); Pneumothorax; Sleep Apnea; Tuberculosis Cardiovascular Medical History: Negative for: Angina; Arrhythmia; Congestive Heart Failure; Coronary Artery Disease; Deep Vein Thrombosis; Hypertension; Hypotension; Myocardial Infarction; Peripheral Arterial Disease; Peripheral Venous Disease; Phlebitis; Vasculitis Gastrointestinal Medical History: Negative for: Cirrhosis ; Colitis; Crohns; Hepatitis A; Hepatitis B; Hepatitis C Endocrine Medical History: Positive for: Type II Diabetes Negative for: Type I Diabetes Time with diabetes: 8 Treated with: Insulin Blood sugar tested every day: No Immunological Medical History: Negative for: Lupus Erythematosus; Raynauds; Scleroderma Integumentary (Skin) Medical History: Negative for: History of Burn Musculoskeletal Medical History: Negative for: Gout; Rheumatoid Arthritis; Osteoarthritis; Osteomyelitis Neurologic Medical History: Negative for: Dementia; Neuropathy; Quadriplegia; Paraplegia; Seizure Disorder Oncologic Medical History: Negative for: Received Chemotherapy; Received Radiation Psychiatric Medical History: Negative for: Anorexia/bulimia; Confinement Anxiety Immunizations Pneumococcal Vaccine: Received Pneumococcal Vaccination:  No Implantable Devices None Hospitalization / Surgery History Type of Hospitalization/Surgery 11/1-11/06/2018- sepsis  foot infection 11/4-11/5 02 sats low respiratory distress Family and Social History Cancer: No; Diabetes: No; Hereditary Spherocytosis: No; Hypertension: No; Kidney Disease: No; Lung Disease: No; Seizures: No; Stroke: No; Thyroid Problems: No; Tuberculosis: No; Never smoker; Marital Status - Single; Alcohol Use: Rarely; Drug Use: No History; Caffeine Use: Never; Financial Concerns: No; Food, Clothing or Shelter Needs: No; Support System Lacking: No; Transportation Concerns: No Electronic Signature(s) Signed: 09/02/2021 4:49:27 PM By: [name] MD FACS Signed: 09/02/2021 6:12:29 PM By: [name] RN, BSN Entered By: [name] on 09/02/2021 16:45:11 -------------------------------------------------------------------------------- [place] Details Patient Name: Date of Service: [name], [name] 09/02/2021 Medical Record Number: [id number] Patient Account Number: [id number] Date of Birth/Sex: Treating RN: [birth year] (36 y.o. 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 Facility Procedures CPT4 Code: [id number] Description: 34356 - DEB SUBQ TISSUE 20 SQ CM/< ICD-10 Diagnosis Description L97.528 Non-pressure chronic ulcer of other part of left foot with other specified severi L97.518 Non-pressure chronic ulcer of other part of right foot with other specified  sever E11.621  Type 2 diabetes mellitus with foot ulcer Modifier: ty ity Quantity: 1 CPT4 Code: 86168372 Description: 90211 - DEBRIDE WOUND 1ST 20 SQ CM OR < ICD-10 Diagnosis Description 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.621 Type 2 diabetes mellitus with foot ulcer Modifier: Quantity: 1 Physician Procedures : CPT4 Code Description Modifier [id number] - WC PHYS LEVEL 3 - EST PT 25 ICD-10 Diagnosis Description L97.518 Non-pressure chronic ulcer of other part of right foot with other specified severity L97.528 Non-pressure chronic ulcer of other part of  left foot with other specified severity 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.621 Type 2 diabetes mellitus with foot ulcer Quantity: 1 : [id number] 53005 - WC PHYS SUBQ TISS 20 SQ CM ICD-10 Diagnosis Description L97.528 Non-pressure chronic ulcer of other part of left foot with other specified severity L97.518 Non-pressure chronic ulcer of other part of right foot with other specified  severity E11.621 Type 2 diabetes mellitus with foot ulcer Quantity: 1 : [id number] 73567 - WC PHYS DEBR WO ANESTH 20 SQ CM ICD-10 Diagnosis Description 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

## 2021-09-09 ENCOUNTER — Encounter (HOSPITAL_BASED_OUTPATIENT_CLINIC_OR_DEPARTMENT_OTHER): Payer: BC Managed Care – PPO | Admitting: General Surgery

## 2021-09-10 ENCOUNTER — Encounter (HOSPITAL_COMMUNITY): Payer: Self-pay

## 2021-09-10 ENCOUNTER — Emergency Department (HOSPITAL_COMMUNITY): Payer: BC Managed Care – PPO

## 2021-09-10 ENCOUNTER — Emergency Department (HOSPITAL_COMMUNITY)
Admission: EM | Admit: 2021-09-10 | Discharge: 2021-09-10 | Disposition: A | Discharge status: Home / Self Care | Payer: BC Managed Care – PPO | Source: Home / Self Care | Attending: Emergency Medicine | Admitting: Emergency Medicine

## 2021-09-10 ENCOUNTER — Other Ambulatory Visit: Payer: Self-pay

## 2021-09-10 DIAGNOSIS — R112 Nausea with vomiting, unspecified: Secondary | ICD-10-CM

## 2021-09-10 DIAGNOSIS — Z20822 Contact with and (suspected) exposure to covid-19: Secondary | ICD-10-CM | POA: Diagnosis not present

## 2021-09-10 DIAGNOSIS — R109 Unspecified abdominal pain: Secondary | ICD-10-CM | POA: Diagnosis not present

## 2021-09-10 DIAGNOSIS — Z79899 Other long term (current) drug therapy: Secondary | ICD-10-CM | POA: Diagnosis not present

## 2021-09-10 DIAGNOSIS — Z794 Long term (current) use of insulin: Secondary | ICD-10-CM | POA: Insufficient documentation

## 2021-09-10 DIAGNOSIS — E1036 Type 1 diabetes mellitus with diabetic cataract: Secondary | ICD-10-CM | POA: Diagnosis not present

## 2021-09-10 DIAGNOSIS — E119 Type 2 diabetes mellitus without complications: Secondary | ICD-10-CM | POA: Insufficient documentation

## 2021-09-10 DIAGNOSIS — R509 Fever, unspecified: Secondary | ICD-10-CM

## 2021-09-10 DIAGNOSIS — E10621 Type 1 diabetes mellitus with foot ulcer: Secondary | ICD-10-CM | POA: Diagnosis not present

## 2021-09-10 DIAGNOSIS — E1065 Type 1 diabetes mellitus with hyperglycemia: Secondary | ICD-10-CM | POA: Diagnosis not present

## 2021-09-10 DIAGNOSIS — E10319 Type 1 diabetes mellitus with unspecified diabetic retinopathy without macular edema: Secondary | ICD-10-CM | POA: Diagnosis not present

## 2021-09-10 LAB — CBC
HCT: 38.4 % — ABNORMAL LOW (ref 39.0–52.0)
Hemoglobin: 12.4 g/dL — ABNORMAL LOW (ref 13.0–17.0)
MCH: 26.7 pg (ref 26.0–34.0)
MCHC: 32.3 g/dL (ref 30.0–36.0)
MCV: 82.6 fL (ref 80.0–100.0)
Platelets: 320 10*3/uL (ref 150–400)
RBC: 4.65 MIL/uL (ref 4.22–5.81)
RDW: 13.5 % (ref 11.5–15.5)
WBC: 10.1 10*3/uL (ref 4.0–10.5)
nRBC: 0 % (ref 0.0–0.2)

## 2021-09-10 LAB — COMPREHENSIVE METABOLIC PANEL
ALT: 19 U/L (ref 0–44)
AST: 14 U/L — ABNORMAL LOW (ref 15–41)
Albumin: 2.9 g/dL — ABNORMAL LOW (ref 3.5–5.0)
Alkaline Phosphatase: 86 U/L (ref 38–126)
Anion gap: 8 (ref 5–15)
BUN: 18 mg/dL (ref 6–20)
CO2: 27 mmol/L (ref 22–32)
Calcium: 9.5 mg/dL (ref 8.9–10.3)
Chloride: 98 mmol/L (ref 98–111)
Creatinine, Ser: 1.44 mg/dL — ABNORMAL HIGH (ref 0.61–1.24)
GFR, Estimated: >60 mL/min (ref >60)
Glucose, Bld: 284 mg/dL — ABNORMAL HIGH (ref 70–99)
Potassium: 5.1 mmol/L (ref 3.5–5.1)
Sodium: 133 mmol/L — ABNORMAL LOW (ref 135–145)
Total Bilirubin: 0.5 mg/dL (ref 0.3–1.2)
Total Protein: 8.3 g/dL — ABNORMAL HIGH (ref 6.5–8.1)

## 2021-09-10 LAB — LIPASE, BLOOD: Lipase: 18 U/L (ref 11–51)

## 2021-09-10 LAB — LACTIC ACID, PLASMA: Lactic Acid, Venous: 1.2 mmol/L (ref 0.5–1.9)

## 2021-09-10 LAB — CBG MONITORING, ED: Glucose-Capillary: 285 mg/dL — ABNORMAL HIGH (ref 70–99)

## 2021-09-10 MED ORDER — ONDANSETRON HCL 4 MG PO TABS: 4.0000 mg | ORAL_TABLET | Freq: Three times a day (TID) | ORAL | 0 refills | Status: DC | PRN

## 2021-09-10 MED ORDER — ONDANSETRON 4 MG PO TBDP
4.0000 mg | ORAL_TABLET | Freq: Once | ORAL | Status: AC | PRN
  Administered 2021-09-10: 4 mg via ORAL
  Filled 2021-09-10: qty 1

## 2021-09-10 MED ORDER — ACETAMINOPHEN 325 MG PO TABS
650.0000 mg | ORAL_TABLET | Freq: Once | ORAL | Status: AC
  Administered 2021-09-10: 650 mg via ORAL
  Filled 2021-09-10: qty 2

## 2021-09-10 MED ORDER — IOHEXOL 300 MG/ML  SOLN
100.0000 mL | Freq: Once | INTRAMUSCULAR | Status: AC | PRN
Start: 2021-09-10 — End: 2021-09-10
  Administered 2021-09-10: 100 mL via INTRAVENOUS

## 2021-09-10 MED ORDER — ONDANSETRON HCL 4 MG/2ML IJ SOLN
4.0000 mg | Freq: Once | INTRAMUSCULAR | Status: DC
  Filled 2021-09-10: qty 2

## 2021-09-10 MED ORDER — SODIUM CHLORIDE 0.9 % IV BOLUS
1000.0000 mL | Freq: Once | INTRAVENOUS | Status: AC
  Administered 2021-09-10: 1000 mL via INTRAVENOUS

## 2021-09-10 NOTE — ED Notes (Signed)
Larry, PA at bedside 

## 2021-09-10 NOTE — Discharge Instructions (Signed)
You were seen today for nausea and vomiting with a fever.  Your work-up was reassuring for no acute intra-abdominal pathology.  I will prescribe Zofran to help with your nausea.  Continue to hydrate as you are able at home.  Take Tylenol or Advil as needed for fever.  I recommend you follow-up with your primary care Monday if you fail to improve.

## 2021-09-10 NOTE — ED Triage Notes (Signed)
Pt c/o N/V. Pt felt hot to touch per wifex1 wk. Pt denies diarrhea. Pt denies anyone else in household w/sx.

## 2021-09-10 NOTE — ED Provider Notes (Signed)
[facility] Provider Note   CSN: [id number] Arrival date & time: 09/10/21  1453     History  Chief Complaint  Patient presents with   Emesis    [name] is a 36 y.o. male.  Patient presents to the hospital complaining of 1 week of nausea, vomiting, and fever.  Patient denies abdominal pain, shortness of breath, diarrhea, chest pain.  Patient states that he was outside for much of the day on Saturday and began to vomit that night, thinking it was due to the heat.  On Sunday he was able to eat breakfast but then began vomiting.  Throughout the week he has continued to vomit keeping very little food down while tolerating small amounts of water.  He states that he vomited 5 times yesterday and today afternoon he vomited 5 additional times.  He denies any sick contacts.  Patient has past medical history of diabetes mellitus, history of pneumonia, history of sepsis, history of acute kidney injury, history of acute respiratory failure with hypoxia, history of pneumonia, history of right and left fifth ray amputation  HPI     Home Medications Prior to Admission medications   Medication Sig Start Date End Date Taking? Authorizing Provider  ondansetron (ZOFRAN) 4 MG tablet Take 1 tablet (4 mg total) by mouth every 8 (eight) hours as needed for nausea or vomiting. 09/10/21  Yes Dorothyann Peng, PA-C  benzonatate (TESSALON) 100 MG capsule Take 1 capsule (100 mg total) by mouth every 8 (eight) hours. 08/02/20   Hughie Closs, PA-C  Continuous Blood Gluc Sensor (FREESTYLE LIBRE 14 DAY SENSOR) MISC Inject 1 patch into the skin every 14 (fourteen) days.    [provider]  Continuous Blood Gluc Sensor MISC 1 each by Does not apply route as directed. Use as directed every 14 days. May dispense FreeStyle [employer] or similar. 06/30/17   Kayleen Memos, DO  doxycycline (VIBRA-TABS) 100 MG tablet Take 1 tablet (100 mg total) by mouth 2 (two)  times daily. 01/27/20   Trula Slade, DPM  insulin aspart (NOVOLOG) 100 UNIT/ML injection Inject 0-15 Units into the skin 3 (three) times daily with meals. Patient taking differently: Inject 7-12 Units into the skin 3 (three) times daily as needed (before meals, if BGL is 250 or greater). 07/01/17   Cherene Altes, MD  Insulin Disposable Pump (OMNIPOD DASH 5 PACK PODS) MISC Inject into the skin. 01/10/20   [provider]  insulin glargine (LANTUS SOLOSTAR) 100 UNIT/ML Solostar Pen Inject 40 Units into the skin daily before breakfast. Patient taking differently: Inject 60 Units into the skin daily before breakfast. 08/20/19   Blanchie Dessert, MD  lidocaine (LIDODERM) 5 % Place 1 patch onto the skin daily. Remove & Discard patch within 12 hours or as directed by MD 03/30/21   Redwine, Madison A, PA-C  naproxen (NAPROSYN) 500 MG tablet Take 1 tablet (500 mg total) by mouth 2 (two) times daily. 03/30/21   Redwine, Madison A, PA-C  SANTYL ointment Apply topically daily. 10/02/19   [provider]  silver sulfADIAZINE (SILVADENE) 1 % cream Apply pea-sized amount to wound daily. 10/10/19   Evelina Bucy, DPM      Allergies    Basaglar Claiborne Rigg [insulin glargine], Metformin and related, and Trulicity [dulaglutide]    Review of Systems   Review of Systems  Constitutional:  Positive for fever.  Respiratory:  Negative for shortness of breath.   Cardiovascular:  Negative for chest pain.  Gastrointestinal:  Positive for nausea and vomiting. Negative for abdominal pain, constipation and diarrhea.  Genitourinary:  Negative for dysuria.  Musculoskeletal:  Negative for arthralgias.  Skin:  Negative for color change.    Physical Exam Updated Vital Signs BP 127/73   Pulse 74   Temp 100.1 F (37.8 C) (Oral)   Resp 18   Ht 6\' 5"  (1.956 m)   Wt (!) 160.6 kg   SpO2 95%   BMI 41.99 kg/m  Physical Exam Vitals and nursing note reviewed.  Constitutional:      General: He is  not in acute distress.    Appearance: He is obese.  HENT:     Head: Normocephalic and atraumatic.     Mouth/Throat:     Mouth: Mucous membranes are moist.  Eyes:     Conjunctiva/sclera: Conjunctivae normal.  Cardiovascular:     Rate and Rhythm: Normal rate and regular rhythm.     Pulses: Normal pulses.  Pulmonary:     Effort: Pulmonary effort is normal.     Breath sounds: Normal breath sounds.  Abdominal:     Palpations: Abdomen is soft.     Tenderness: There is no abdominal tenderness.  Musculoskeletal:     Cervical back: Normal range of motion and neck supple.  Skin:    General: Skin is warm and dry.     Capillary Refill: Capillary refill takes less than 2 seconds.  Neurological:     Mental Status: He is alert.     ED Results / Procedures / Treatments   Labs (all labs ordered are listed, but only abnormal results are displayed) Labs Reviewed  COMPREHENSIVE METABOLIC PANEL - Abnormal; Notable for the following components:      Result Value   Sodium 133 (*)    Glucose, Bld 284 (*)    Creatinine, Ser 1.44 (*)    Total Protein 8.3 (*)    Albumin 2.9 (*)    AST 14 (*)    All other components within normal limits  CBC - Abnormal; Notable for the following components:   Hemoglobin 12.4 (*)    HCT 38.4 (*)    All other components within normal limits  CBG MONITORING, ED - Abnormal; Notable for the following components:   Glucose-Capillary 285 (*)    All other components within normal limits  LIPASE, BLOOD  LACTIC ACID, PLASMA  URINALYSIS, ROUTINE W REFLEX MICROSCOPIC  LACTIC ACID, PLASMA    EKG None  Radiology CT ABDOMEN PELVIS W CONTRAST  Result Date: 09/10/2021 CLINICAL DATA:  Abdominal pain, acute, nonlocalized. N/V x 1 week Patient has HX of DM2 EXAM: CT ABDOMEN AND PELVIS WITH CONTRAST TECHNIQUE: Multidetector CT imaging of the abdomen and pelvis was performed using the standard protocol following bolus administration of intravenous contrast. RADIATION DOSE  REDUCTION: This exam was performed according to the departmental dose-optimization program which includes automated exposure control, adjustment of the mA and/or kV according to patient size and/or use of iterative reconstruction technique. CONTRAST:  175mL OMNIPAQUE IOHEXOL 300 MG/ML  SOLN COMPARISON:  CT abdomen pelvis 08/21/2019 FINDINGS: Lower chest: Bilateral lower lobe subsegmental atelectasis. No acute abnormality. Hepatobiliary: The liver is enlarged measuring up to 23 cm. No focal liver abnormality. No gallstones, gallbladder wall thickening, or pericholecystic fluid. No biliary dilatation. Pancreas: No focal lesion. Normal pancreatic contour. No surrounding inflammatory changes. No main pancreatic ductal dilatation. Spleen: Interval increase in size of couple indeterminate splenic hypodensities measuring 3.3 cm (from 2 cm)  and 1.3 cm (from 0.9cm). Interval development of a third vague hypodensity within the spleen (3:37). The spleen is mildly enlarged in size measuring up 13.5 cm. Adrenals/Urinary Tract: No adrenal nodule bilaterally. Bilateral kidneys enhance symmetrically. Subcentimeter hypodensities are too small to characterize. No hydronephrosis. No hydroureter. The urinary bladder is unremarkable. Stomach/Bowel: Stomach is within normal limits. No evidence of bowel wall thickening or dilatation. Scattered colonic diverticulosis. Appendix appears normal. Vascular/Lymphatic: No abdominal aorta or iliac aneurysm. No abdominal, pelvic, or inguinal lymphadenopathy. Reproductive: Prostate is unremarkable. Other: No intraperitoneal free fluid. No intraperitoneal free gas. No organized fluid collection. Musculoskeletal: Tiny fat containing umbilical hernia. No suspicious lytic or blastic osseous lesions. No acute displaced fracture. Multilevel degenerative changes of the spine. IMPRESSION: 1. Scattered colonic diverticulosis with no acute diverticulitis. 2. Hepatomegaly. 3. Mild splenomegaly. Interval  increase in size (compared to 2021) of couple indeterminate splenic hypodensities measuring 3.3 cm (from 2 cm) and 1.3 cm (from 0.9cm). Intervaldevelopment of a third vague splenic hypodensity. Electronically Signed   By: Iven Finn M.D.   On: 09/10/2021 20:01    Procedures Procedures    Medications Ordered in ED Medications  ondansetron [facility]) injection 4 mg (0 mg Intravenous Hold 09/10/21 2046)  ondansetron (ZOFRAN-ODT) disintegrating tablet 4 mg (4 mg Oral Given 09/10/21 1611)  acetaminophen (TYLENOL) tablet 650 mg (650 mg Oral Given 09/10/21 1643)  iohexol (OMNIPAQUE) 300 MG/ML solution 100 mL (100 mLs Intravenous Contrast Given 09/10/21 1932)  sodium chloride 0.9 % bolus 1,000 mL (1,000 mLs Intravenous New Bag/Given 09/10/21 2047)    ED Course/ Medical Decision Making/ A&P                           Medical Decision Making Risk Prescription drug management.   This patient presents to the ED for concern of nausea, vomiting, fever, this involves an extensive number of treatment options, and is a complaint that carries with it a high risk of complications and morbidity.  The differential diagnosis includes viral gastroenteritis, colitis, appendicitis, pancreatitis, cholecystitis, and others   Co morbidities that complicate the patient evaluation  History of diabetes   Additional history obtained:  Additional history obtained from family at bedside External records from outside source obtained and reviewed including notes from wound care   Lab Tests:  I Ordered, and personally interpreted labs.  The pertinent results include: WBC 10.1, lipase 18, venous lactic acid 1.2, sodium 133, glucose 284, creatinine 1.44   Imaging Studies ordered:  I ordered imaging studies including CT abdomen pelvis I independently visualized and interpreted imaging which showed 1. Scattered colonic diverticulosis with no acute diverticulitis. 2. Hepatomegaly. 3. Mild splenomegaly. Interval  increase in size (compared to 2021) of couple indeterminate splenic hypodensities measuring 3.3 cm (from 2 cm) and 1.3 cm (from 0.9cm). Intervaldevelopment of a third vague splenic hypodensit  I agree with the radiologist interpretation   Problem List / ED Course / Critical interventions / Medication management   I ordered medication including Tylenol for fever, normal saline for rehydration, Zofran for nausea Reevaluation of the patient after these medicines showed that the patient improved I have reviewed the patients home medicines and have made adjustments as needed   Test / Admission - Considered:  CT shows no signs of appendicitis or other acute abdominal etiology for the patient's nausea and vomiting.  P.o. challenge was attempted and the patient was able to tolerate oral fluids.  This is likely viral in nature based  on the patient's presentation.  I believe the patient may discharge home at this time with Zofran prescription and orders to rehydrate as able at home.  The patient does feel comfortable with this.  Strict return precautions provided.  Patient to follow-up with his primary care provider        Final Clinical Impression(s) / ED Diagnoses Final diagnoses:  Nausea and vomiting, unspecified vomiting type  Fever, unspecified fever cause    Rx / DC Orders ED Discharge Orders          Ordered    ondansetron (ZOFRAN) 4 MG tablet  Every 8 hours PRN        09/10/21 2243              Dorothyann Peng, PA-C 09/10/21 2244    Fredia Sorrow, MD 09/13/21 1235

## 2021-09-10 NOTE — ED Provider Triage Note (Signed)
Emergency Medicine Provider Triage Evaluation Note  [name] , a 36 y.o. male  was evaluated in triage.  Pt complains of abdominal pain, nausea vomiting.  This been going on for a week.  Reports that it started after he spent the day on the boat last weekend.  Thought it was heat poisoning.  The next day he began to vomit profusely.  Currently is unable to hold down fluids or food.  Type II diabetic on insulin.  Says his blood sugars have been running high.  No history of gastroparesis.  Review of Systems  Positive: Abdominal discomfort, nausea and vomiting Negative: Diarrhea  Physical Exam  BP (!) 145/84 (BP Location: Right Arm)   Pulse (!) 110   Temp (!) 102.8 F (39.3 C) (Oral)   Resp 20   Ht 6\' 5"  (1.956 m)   Wt (!) 160.6 kg   SpO2 92%   BMI 41.99 kg/m  Gen:   Awake, no distress   Resp:  Normal effort  MSK:   Moves extremities without difficulty  Other:  Generalized abdominal tenderness, vomiting in triage  Medical Decision Making  Medically screening exam initiated at 4:18 PM.  Appropriate orders placed.  Max Howson was informed that the remainder of the evaluation will be completed by another provider, this initial triage assessment does not replace that evaluation, and the importance of remaining in the ED until their evaluation is complete.    Actively vomiting in triage, Zofran ordered.  Tachycardic with a fever as well.  Tylenol ordered.   Rhae Hammock, PA-C 09/10/21 1620

## 2021-09-10 NOTE — ED Notes (Signed)
Pt informed a urine sample is needed. 

## 2021-09-12 ENCOUNTER — Encounter (HOSPITAL_COMMUNITY): Payer: Self-pay

## 2021-09-12 ENCOUNTER — Other Ambulatory Visit: Payer: Self-pay

## 2021-09-12 ENCOUNTER — Observation Stay (HOSPITAL_COMMUNITY): Payer: BC Managed Care – PPO

## 2021-09-12 ENCOUNTER — Observation Stay (HOSPITAL_COMMUNITY)
Admission: EM | Admit: 2021-09-12 | Discharge: 2021-09-14 | DRG: 392 | Disposition: A | Discharge status: Home / Self Care | Payer: BC Managed Care – PPO | Attending: Student | Admitting: Student

## 2021-09-12 DIAGNOSIS — N179 Acute kidney failure, unspecified: Secondary | ICD-10-CM | POA: Diagnosis present

## 2021-09-12 DIAGNOSIS — Z9641 Presence of insulin pump (external) (internal): Secondary | ICD-10-CM | POA: Diagnosis present

## 2021-09-12 DIAGNOSIS — A084 Viral intestinal infection, unspecified: Secondary | ICD-10-CM | POA: Diagnosis present

## 2021-09-12 DIAGNOSIS — D649 Anemia, unspecified: Secondary | ICD-10-CM | POA: Diagnosis present

## 2021-09-12 DIAGNOSIS — E10319 Type 1 diabetes mellitus with unspecified diabetic retinopathy without macular edema: Secondary | ICD-10-CM | POA: Insufficient documentation

## 2021-09-12 DIAGNOSIS — D7389 Other diseases of spleen: Secondary | ICD-10-CM

## 2021-09-12 DIAGNOSIS — D509 Iron deficiency anemia, unspecified: Secondary | ICD-10-CM | POA: Diagnosis present

## 2021-09-12 DIAGNOSIS — R509 Fever, unspecified: Secondary | ICD-10-CM | POA: Insufficient documentation

## 2021-09-12 DIAGNOSIS — E1065 Type 1 diabetes mellitus with hyperglycemia: Secondary | ICD-10-CM | POA: Diagnosis present

## 2021-09-12 DIAGNOSIS — R112 Nausea with vomiting, unspecified: Principal | ICD-10-CM | POA: Diagnosis present

## 2021-09-12 DIAGNOSIS — E118 Type 2 diabetes mellitus with unspecified complications: Secondary | ICD-10-CM

## 2021-09-12 DIAGNOSIS — K760 Fatty (change of) liver, not elsewhere classified: Secondary | ICD-10-CM

## 2021-09-12 DIAGNOSIS — E08621 Diabetes mellitus due to underlying condition with foot ulcer: Secondary | ICD-10-CM | POA: Diagnosis present

## 2021-09-12 DIAGNOSIS — E103411 Type 1 diabetes mellitus with severe nonproliferative diabetic retinopathy with macular edema, right eye: Secondary | ICD-10-CM | POA: Diagnosis present

## 2021-09-12 DIAGNOSIS — Z79899 Other long term (current) drug therapy: Secondary | ICD-10-CM

## 2021-09-12 DIAGNOSIS — E66813 Obesity, class 3: Secondary | ICD-10-CM

## 2021-09-12 DIAGNOSIS — Z20822 Contact with and (suspected) exposure to covid-19: Secondary | ICD-10-CM | POA: Diagnosis present

## 2021-09-12 DIAGNOSIS — Z8701 Personal history of pneumonia (recurrent): Secondary | ICD-10-CM

## 2021-09-12 DIAGNOSIS — Z8619 Personal history of other infectious and parasitic diseases: Secondary | ICD-10-CM

## 2021-09-12 DIAGNOSIS — E1040 Type 1 diabetes mellitus with diabetic neuropathy, unspecified: Secondary | ICD-10-CM | POA: Diagnosis present

## 2021-09-12 DIAGNOSIS — Z833 Family history of diabetes mellitus: Secondary | ICD-10-CM

## 2021-09-12 DIAGNOSIS — L97529 Non-pressure chronic ulcer of other part of left foot with unspecified severity: Secondary | ICD-10-CM | POA: Diagnosis present

## 2021-09-12 DIAGNOSIS — Z791 Long term (current) use of non-steroidal anti-inflammatories (NSAID): Secondary | ICD-10-CM

## 2021-09-12 DIAGNOSIS — Z888 Allergy status to other drugs, medicaments and biological substances status: Secondary | ICD-10-CM

## 2021-09-12 DIAGNOSIS — Z89421 Acquired absence of other right toe(s): Secondary | ICD-10-CM

## 2021-09-12 DIAGNOSIS — Z6841 Body Mass Index (BMI) 40.0 and over, adult: Secondary | ICD-10-CM

## 2021-09-12 DIAGNOSIS — L97519 Non-pressure chronic ulcer of other part of right foot with unspecified severity: Secondary | ICD-10-CM | POA: Diagnosis present

## 2021-09-12 DIAGNOSIS — R109 Unspecified abdominal pain: Secondary | ICD-10-CM | POA: Insufficient documentation

## 2021-09-12 DIAGNOSIS — E103412 Type 1 diabetes mellitus with severe nonproliferative diabetic retinopathy with macular edema, left eye: Secondary | ICD-10-CM | POA: Diagnosis present

## 2021-09-12 DIAGNOSIS — K5730 Diverticulosis of large intestine without perforation or abscess without bleeding: Secondary | ICD-10-CM | POA: Diagnosis present

## 2021-09-12 DIAGNOSIS — K3184 Gastroparesis: Secondary | ICD-10-CM | POA: Diagnosis present

## 2021-09-12 DIAGNOSIS — E103413 Type 1 diabetes mellitus with severe nonproliferative diabetic retinopathy with macular edema, bilateral: Secondary | ICD-10-CM | POA: Diagnosis present

## 2021-09-12 DIAGNOSIS — Z89422 Acquired absence of other left toe(s): Secondary | ICD-10-CM

## 2021-09-12 DIAGNOSIS — E1036 Type 1 diabetes mellitus with diabetic cataract: Secondary | ICD-10-CM | POA: Insufficient documentation

## 2021-09-12 DIAGNOSIS — E1043 Type 1 diabetes mellitus with diabetic autonomic (poly)neuropathy: Secondary | ICD-10-CM | POA: Diagnosis present

## 2021-09-12 DIAGNOSIS — E10621 Type 1 diabetes mellitus with foot ulcer: Secondary | ICD-10-CM | POA: Insufficient documentation

## 2021-09-12 DIAGNOSIS — Z794 Long term (current) use of insulin: Secondary | ICD-10-CM

## 2021-09-12 LAB — CBG MONITORING, ED
Glucose-Capillary: 275 mg/dL — ABNORMAL HIGH (ref 70–99)
Glucose-Capillary: 123 mg/dL — ABNORMAL HIGH (ref 70–99)

## 2021-09-12 LAB — CBC WITH DIFFERENTIAL/PLATELET
Abs Immature Granulocytes: 0.04 10*3/uL (ref 0.00–0.07)
Basophils Absolute: 0 10*3/uL (ref 0.0–0.1)
Basophils Relative: 0 %
Eosinophils Absolute: 0.1 10*3/uL (ref 0.0–0.5)
Eosinophils Relative: 2 %
HCT: 36.9 % — ABNORMAL LOW (ref 39.0–52.0)
Hemoglobin: 11.7 g/dL — ABNORMAL LOW (ref 13.0–17.0)
Immature Granulocytes: 1 %
Lymphocytes Relative: 13 %
Lymphs Abs: 0.9 10*3/uL (ref 0.7–4.0)
MCH: 26.3 pg (ref 26.0–34.0)
MCHC: 31.7 g/dL (ref 30.0–36.0)
MCV: 82.9 fL (ref 80.0–100.0)
Monocytes Absolute: 0.9 10*3/uL (ref 0.1–1.0)
Monocytes Relative: 13 %
Neutro Abs: 5.2 10*3/uL (ref 1.7–7.7)
Neutrophils Relative %: 71 %
Platelets: 330 10*3/uL (ref 150–400)
RBC: 4.45 MIL/uL (ref 4.22–5.81)
RDW: 13.4 % (ref 11.5–15.5)
WBC: 7.2 10*3/uL (ref 4.0–10.5)
nRBC: 0 % (ref 0.0–0.2)

## 2021-09-12 LAB — COMPREHENSIVE METABOLIC PANEL
ALT: 16 U/L (ref 0–44)
AST: 14 U/L — ABNORMAL LOW (ref 15–41)
Albumin: 2.6 g/dL — ABNORMAL LOW (ref 3.5–5.0)
Alkaline Phosphatase: 80 U/L (ref 38–126)
Anion gap: 10 (ref 5–15)
BUN: 15 mg/dL (ref 6–20)
CO2: 26 mmol/L (ref 22–32)
Calcium: 9.1 mg/dL (ref 8.9–10.3)
Chloride: 94 mmol/L — ABNORMAL LOW (ref 98–111)
Creatinine, Ser: 1.36 mg/dL — ABNORMAL HIGH (ref 0.61–1.24)
GFR, Estimated: >60 mL/min (ref >60)
Glucose, Bld: 282 mg/dL — ABNORMAL HIGH (ref 70–99)
Potassium: 4.6 mmol/L (ref 3.5–5.1)
Sodium: 130 mmol/L — ABNORMAL LOW (ref 135–145)
Total Bilirubin: 0.5 mg/dL (ref 0.3–1.2)
Total Protein: 7.6 g/dL (ref 6.5–8.1)

## 2021-09-12 LAB — LIPASE, BLOOD: Lipase: 19 U/L (ref 11–51)

## 2021-09-12 LAB — RESP PANEL BY RT-PCR (FLU A&B, COVID) ARPGX2
Influenza A by PCR: NEGATIVE
Influenza B by PCR: NEGATIVE
SARS Coronavirus 2 by RT PCR: NEGATIVE

## 2021-09-12 MED ORDER — SODIUM CHLORIDE 0.9 % IV BOLUS
1000.0000 mL | Freq: Once | INTRAVENOUS | Status: AC
Start: 2021-09-12 — End: 2021-09-12
  Administered 2021-09-12: 1000 mL via INTRAVENOUS

## 2021-09-12 MED ORDER — ONDANSETRON HCL 4 MG/2ML IJ SOLN
4.0000 mg | Freq: Once | INTRAMUSCULAR | Status: AC
  Administered 2021-09-12: 4 mg via INTRAVENOUS
  Filled 2021-09-12: qty 2

## 2021-09-12 MED ORDER — ONDANSETRON HCL 4 MG PO TABS: 4.0000 mg | ORAL_TABLET | Freq: Four times a day (QID) | ORAL | Status: DC | PRN

## 2021-09-12 MED ORDER — ACETAMINOPHEN 500 MG PO TABS
1000.0000 mg | ORAL_TABLET | Freq: Once | ORAL | Status: AC
  Administered 2021-09-12: 1000 mg via ORAL
  Filled 2021-09-12: qty 2

## 2021-09-12 MED ORDER — ONDANSETRON HCL 4 MG/2ML IJ SOLN
4.0000 mg | Freq: Four times a day (QID) | INTRAMUSCULAR | Status: DC | PRN
  Filled 2021-09-12: qty 2

## 2021-09-12 MED ORDER — INSULIN ASPART 100 UNIT/ML IJ SOLN: 0.0000 [IU] | Freq: Every day | INTRAMUSCULAR | Status: DC

## 2021-09-12 MED ORDER — ACETAMINOPHEN 325 MG PO TABS
650.0000 mg | ORAL_TABLET | Freq: Four times a day (QID) | ORAL | Status: DC | PRN
  Administered 2021-09-13: 650 mg via ORAL
  Filled 2021-09-12: qty 2

## 2021-09-12 MED ORDER — INSULIN GLARGINE-YFGN 100 UNIT/ML ~~LOC~~ SOLN
30.0000 [IU] | Freq: Every day | SUBCUTANEOUS | Status: DC
  Administered 2021-09-13 – 2021-09-14 (×2): 30 [IU] via SUBCUTANEOUS
  Filled 2021-09-12 (×2): qty 0.3

## 2021-09-12 MED ORDER — ONDANSETRON 4 MG PO TBDP
4.0000 mg | ORAL_TABLET | Freq: Once | ORAL | Status: AC
  Administered 2021-09-12: 4 mg via ORAL
  Filled 2021-09-12: qty 1

## 2021-09-12 MED ORDER — POTASSIUM CHLORIDE IN NACL 20-0.9 MEQ/L-% IV SOLN
INTRAVENOUS | Status: DC
  Filled 2021-09-12 (×2): qty 1000

## 2021-09-12 MED ORDER — INSULIN ASPART 100 UNIT/ML IJ SOLN
0.0000 [IU] | Freq: Three times a day (TID) | INTRAMUSCULAR | Status: DC
  Administered 2021-09-13 (×2): 4 [IU] via SUBCUTANEOUS
  Administered 2021-09-13 – 2021-09-14 (×2): 3 [IU] via SUBCUTANEOUS

## 2021-09-12 MED ORDER — METOCLOPRAMIDE HCL 5 MG/ML IJ SOLN
10.0000 mg | Freq: Three times a day (TID) | INTRAMUSCULAR | Status: DC
Start: 2021-09-12 — End: 2021-09-13
  Administered 2021-09-12 – 2021-09-13 (×2): 10 mg via INTRAVENOUS
  Filled 2021-09-12 (×2): qty 2

## 2021-09-12 MED ORDER — ENOXAPARIN SODIUM 80 MG/0.8ML IJ SOSY
80.0000 mg | PREFILLED_SYRINGE | INTRAMUSCULAR | Status: DC
  Administered 2021-09-12 – 2021-09-13 (×2): 80 mg via SUBCUTANEOUS
  Filled 2021-09-12 (×3): qty 0.8

## 2021-09-12 MED ORDER — SENNOSIDES-DOCUSATE SODIUM 8.6-50 MG PO TABS: 1.0000 | ORAL_TABLET | Freq: Every evening | ORAL | Status: DC | PRN

## 2021-09-12 MED ORDER — ACETAMINOPHEN 650 MG RE SUPP: 650.0000 mg | Freq: Four times a day (QID) | RECTAL | Status: DC | PRN

## 2021-09-12 NOTE — Assessment & Plan Note (Signed)
Admitted with persistent nausea vomiting ongoing for 1 week.  Has fever up to 100.7 F.  CT A/P 09/10/21 negative for acute etiology, did show mild splenomegaly.  Still not able to maintain any adequate oral intake.  Suspect viral gastroenteritis although patient does have mild RUQ abdominal pain.  Had 1 episode of diarrhea. -Start on IV Reglan 10 mg q8h with Zofran as needed -Continue IV fluid hydration overnight -Obtain abdominal ultrasound -Advance diet as tolerated

## 2021-09-12 NOTE — ED Notes (Signed)
Patient transported to CT 

## 2021-09-12 NOTE — ED Provider Notes (Signed)
[name] EMERGENCY DEPARTMENT Provider Note   CSN: [id number] Arrival date & time: 09/12/21  1200     History  No chief complaint on file.   [name] is a 36 y.o. male.  36 year old male with prior medical history as detailed below presents for evaluation.  Patient with complaints of continued nausea, vomiting, diffuse abdominal cramping pain, and subjective fevers.  Symptoms been ongoing for the last 3 to 4 days.  Patient was seen previously for same complaints on June 9.  Work-up at that time was without significant acute abnormality.  Patient apparently felt better after that ED evaluation and went home.  Shortly after returning home he began to experience the same symptoms.  He reports decreased p.o. intake secondary to persistent nausea and vomiting.  He reports subjective fevers at home.  He did not obtain an objective temp.  He does have chronic wounds to both lower extremities.  He is followed by wound care in the outpatient setting for same.  He reports recent evaluation by wound care who feels that his bilateral lower extremity chronic wounds "look good."   He denies recent antibiotic use.  The history is provided by the patient and medical records.  Illness Location:  Nausea, vomiting, fever, diarrhea, abdominal cramps Severity:  Moderate Onset quality:  Gradual Duration:  4 days Timing:  Constant Progression:  Worsening Chronicity:  New      Home Medications Prior to Admission medications   Medication Sig Start Date End Date Taking? Authorizing Provider  benzonatate (TESSALON) 100 MG capsule Take 1 capsule (100 mg total) by mouth every 8 (eight) hours. 08/02/20   Rushie Chestnut, PA-C  Continuous Blood Gluc Sensor (FREESTYLE LIBRE 14 DAY SENSOR) MISC Inject 1 patch into the skin every 14 (fourteen) days.    [provider]  Continuous Blood Gluc Sensor MISC 1 each by Does not apply route as directed. Use as directed every 14  days. May dispense FreeStyle [employer] or similar. 06/30/17   Darlin Drop, DO  doxycycline (VIBRA-TABS) 100 MG tablet Take 1 tablet (100 mg total) by mouth 2 (two) times daily. 01/27/20   Vivi Barrack, DPM  insulin aspart (NOVOLOG) 100 UNIT/ML injection Inject 0-15 Units into the skin 3 (three) times daily with meals. Patient taking differently: Inject 7-12 Units into the skin 3 (three) times daily as needed (before meals, if BGL is 250 or greater). 07/01/17   Lonia Blood, MD  Insulin Disposable Pump (OMNIPOD DASH 5 PACK PODS) MISC Inject into the skin. 01/10/20   [provider]  insulin glargine (LANTUS SOLOSTAR) 100 UNIT/ML Solostar Pen Inject 40 Units into the skin daily before breakfast. Patient taking differently: Inject 60 Units into the skin daily before breakfast. 08/20/19   Gwyneth Sprout, MD  lidocaine (LIDODERM) 5 % Place 1 patch onto the skin daily. Remove & Discard patch within 12 hours or as directed by MD 03/30/21   Redwine, Madison A, PA-C  naproxen (NAPROSYN) 500 MG tablet Take 1 tablet (500 mg total) by mouth 2 (two) times daily. 03/30/21   Redwine, Madison A, PA-C  ondansetron (ZOFRAN) 4 MG tablet Take 1 tablet (4 mg total) by mouth every 8 (eight) hours as needed for nausea or vomiting. 09/10/21   Darrick Grinder, PA-C  SANTYL ointment Apply topically daily. 10/02/19   [provider]  silver sulfADIAZINE (SILVADENE) 1 % cream Apply pea-sized amount to wound daily. 10/10/19   Park Liter, DPM  Allergies    Basaglar kwikpen [insulin glargine], Metformin and related, and Trulicity [dulaglutide]    Review of Systems   Review of Systems  All other systems reviewed and are negative.   Physical Exam Updated Vital Signs BP (!) 148/77 (BP Location: Right Arm)   Pulse 96   Temp (!) 100.7 F (38.2 C) (Oral)   Resp 17   SpO2 95%  Physical Exam Vitals and nursing note reviewed.  Constitutional:      General: He is not in acute  distress.    Appearance: He is well-developed.     Comments: Mildly ill in appearance, nauseated, actively vomiting  HENT:     Head: Normocephalic and atraumatic.  Eyes:     Conjunctiva/sclera: Conjunctivae normal.     Pupils: Pupils are equal, round, and reactive to light.  Cardiovascular:     Rate and Rhythm: Normal rate and regular rhythm.     Heart sounds: Normal heart sounds.  Pulmonary:     Effort: Pulmonary effort is normal. No respiratory distress.     Breath sounds: Normal breath sounds.  Abdominal:     General: There is no distension.     Palpations: Abdomen is soft.     Tenderness: There is no abdominal tenderness.  Musculoskeletal:        General: No deformity. Normal range of motion.     Cervical back: Normal range of motion and neck supple.  Skin:    General: Skin is warm and dry.     Comments: Chronic wounds noted to both feet.  Wounds do not appear to be actively infected.  Minimal to no erythema surrounding wounds.  No significant drainage noted.  Neurological:     General: No focal deficit present.     Mental Status: He is alert and oriented to person, place, and time.     ED Results / Procedures / Treatments   Labs (all labs ordered are listed, but only abnormal results are displayed) Labs Reviewed  CBC WITH DIFFERENTIAL/PLATELET - Abnormal; Notable for the following components:      Result Value   Hemoglobin 11.7 (*)    HCT 36.9 (*)    All other components within normal limits  COMPREHENSIVE METABOLIC PANEL - Abnormal; Notable for the following components:   Sodium 130 (*)    Chloride 94 (*)    Glucose, Bld 282 (*)    Creatinine, Ser 1.36 (*)    Albumin 2.6 (*)    AST 14 (*)    All other components within normal limits  CBG MONITORING, ED - Abnormal; Notable for the following components:   Glucose-Capillary 275 (*)    All other components within normal limits  CULTURE, BLOOD (ROUTINE X 2)  CULTURE, BLOOD (ROUTINE X 2)  LIPASE, BLOOD   URINALYSIS, ROUTINE W REFLEX MICROSCOPIC  HIV ANTIBODY (ROUTINE TESTING W REFLEX)  MAGNESIUM  COMPREHENSIVE METABOLIC PANEL  CBC  HEMOGLOBIN A1C    EKG None  Radiology No results found.  Procedures Procedures    Medications Ordered in ED Medications  enoxaparin (LOVENOX) injection 80 mg (has no administration in time range)  acetaminophen (TYLENOL) tablet 650 mg (has no administration in time range)    Or  acetaminophen (TYLENOL) suppository 650 mg (has no administration in time range)  0.9 % NaCl with KCl 20 mEq/ L  infusion (has no administration in time range)  ondansetron (ZOFRAN) tablet 4 mg (has no administration in time range)    Or  ondansetron (ZOFRAN) injection  4 mg (has no administration in time range)  senna-docusate (Senokot-S) tablet 1 tablet (has no administration in time range)  metoCLOPramide (REGLAN) injection 10 mg (has no administration in time range)  insulin aspart (novoLOG) injection 0-5 Units (has no administration in time range)  insulin aspart (novoLOG) injection 0-20 Units (has no administration in time range)  insulin glargine-yfgn (SEMGLEE) injection 30 Units (has no administration in time range)  ondansetron (ZOFRAN-ODT) disintegrating tablet 4 mg (4 mg Oral Given 09/12/21 1619)  ondansetron (ZOFRAN) injection 4 mg (4 mg Intravenous Given 09/12/21 1628)  sodium chloride 0.9 % bolus 1,000 mL (0 mLs Intravenous Stopped 09/12/21 1733)  ondansetron (ZOFRAN) injection 4 mg (4 mg Intravenous Given 09/12/21 1741)  sodium chloride 0.9 % bolus 1,000 mL (0 mLs Intravenous Stopped 09/12/21 1917)  acetaminophen (TYLENOL) tablet 1,000 mg (1,000 mg Oral Given 09/12/21 1838)    ED Course/ Medical Decision Making/ A&P                           Medical Decision Making Risk OTC drugs. Prescription drug management. Decision regarding hospitalization.    Medical Screen Complete  This patient presented to the ED with complaint of fever, nausea, vomiting,  abdominal cramps, malaise, decreased p.o. intake.  This complaint involves an extensive number of treatment options. The initial differential diagnosis includes, but is not limited to, bacterial versus viral infection, metabolic abnormality, dehydration, etc.  This presentation is: Acute, Self-Limited, Previously Undiagnosed, Uncertain Prognosis, Complicated, Systemic Symptoms, and Threat to Life/Bodily Function  Patient is presenting with complaint of persistent nausea, vomiting, abdominal cramps, malaise, and fevers.  Symptoms been ongoing for the last 4 days.  Recent ED evaluation did not reveal significant acute pathology.  However, patient's symptoms continue.  He is noted to be febrile here in the ED.  Patient does appear to be minimally improved with IV fluids and antiemetics.  However, patient would likely benefit from overnight observation and symptomatic treatment.  Blood cultures obtained when patient spiked a temperature of 100.7.  Antibiotics held at this time.  Case discussed with hospitalist service who will evaluate for admission.  Co morbidities that complicated the patient's evaluation  Diabetes   Additional history obtained:  [facility] External records from outside sources obtained and reviewed including prior ED visits and prior Inpatient records.    Lab Tests:  I ordered and personally interpreted labs.  The pertinent results include: CBC, CMP, lipase   Imaging Studies ordered:  I ordered imaging studies including chest x-ray  I agree with the radiologist interpretation.   Cardiac Monitoring:  The patient was maintained on a cardiac monitor.  I personally viewed and interpreted the cardiac monitor which showed an underlying rhythm of: NSR   Medicines ordered:  I ordered medication including IV fluids, Zofran, Tylenol for nausea, dehydration Reevaluation of the patient after these medicines showed that the patient:  improved   Problem List / ED Course:  Fever, nausea, vomiting, diarrhea   Reevaluation:  After the interventions noted above, I reevaluated the patient and found that they have: improved   Disposition:  After consideration of the diagnostic results and the patients response to treatment, I feel that the patent would benefit from admission.          Final Clinical Impression(s) / ED Diagnoses Final diagnoses:  Fever, unspecified fever cause  Nausea and vomiting, unspecified vomiting type    Rx / DC Orders ED Discharge Orders  None         Wynetta Fines, MD 09/12/21 2106

## 2021-09-12 NOTE — Hospital Course (Signed)
[name] is a 36 y.o. 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

## 2021-09-12 NOTE — ED Provider Triage Note (Signed)
Emergency Medicine Provider Triage Evaluation Note  [name] , a 36 y.o. male  was evaluated in triage.  Pt complains of nausea, vomiting, generalized abdominal pain since this morning around 3 AM.  Reports trying to take Zofran, vomited after this occurred.  Has continued to have nonbilious, nonbloody emesis.  Did have 1 bowel movement today which was "green and liquidy ".  Was evaluated in the ED 2 days ago and had a normal CT scan.  Did take his insulin prior to arrival.  Blood sugar in triage was around the 200s.  No sick contacts although small children in the home.  Review of Systems  Positive: Nausea, vomiting Negative: Fever, sob, cough  Physical Exam  BP 128/76 (BP Location: Right Arm)   Pulse 95   Temp 99.1 F (37.3 C) (Oral)   Resp 16   SpO2 91%  Gen:   Awake, no distress   Resp:  Normal effort  MSK:   Moves extremities without difficulty  Other:  Abdomen is soft, bowel sounds are diminished.  Medical Decision Making  Medically screening exam initiated at 12:06 PM.  Appropriate orders placed.  [name] was informed that the remainder of the evaluation will be completed by another provider, this initial triage assessment does not replace that evaluation, and the importance of remaining in the ED until their evaluation is complete.     Claude Manges, PA-C 09/12/21 1211

## 2021-09-12 NOTE — ED Triage Notes (Signed)
Patient complains of general abdominal pain with vomiting this am. Took insulin and zofran with minimal relief. Patient alert and oriented, denies fever

## 2021-09-12 NOTE — H&P (Signed)
History and Physical    [name] ZOX:[id number] DOB: [birth year] DOA: 09/12/2021  PCP: [name], MD  Patient coming from: Home  I have personally briefly reviewed patient's old medical records in [facility]  Chief Complaint: Nausea and vomiting  HPI: [name] is a 36 y.o. 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  Patient states he went to the [facility] on 6/30.  He was out in the sun and thought he may have gotten dehydrated.  The next day he had elevated eggs and bacon and since then has been having frequent nausea and vomiting.  He has not been able to maintain any adequate oral intake.  He has had occasional right upper quadrant abdominal pain.  He reports subjective fevers at home.  He has not had any bloody emesis.  He reports 1 episode of diarrhea this morning but otherwise not having frequent bowel movements.  He says he has not urinated in several days.  Patient initially presented to the ED on 09/10/2021 for 1 week of nausea, vomiting. CT abdomen/pelvis with contrast showed scattered colonic diverticulosis without acute diverticulitis, hepatomegaly, mild splenomegaly.  He was given IV fluids and antiemetics with improvement and discharged to home.  Symptoms however returned despite use of Zofran at home.  He has not been able to keep any fluids down.  Due to persistent symptoms he return to the ED for further evaluation and management.  ED Course  Labs/Imaging on admission: I have personally reviewed following labs and imaging studies.  Initial vitals showed BP 120/76, pulse 95, RR 16, temp 99.1 F, SPO2 91% on room air.  Tmax 100.7 F.  Labs show sodium 130 (134 when corrected for hyperglycemia), potassium 4.6, bicarb 26, BUN 15, creatinine 1.36, serum glucose 282, lipase 19, WBC  7.2, hemoglobin 11.7, platelets 230,000.  Blood cultures collected and pending.  Patient was given 2 L normal saline, IV Zofran x2.  The hospitalist service was consulted to admit for further evaluation and management  Review of Systems: All systems reviewed and are negative except as documented in history of present illness above.   Past Medical History:  Diagnosis Date   Diabetes mellitus    Osteomyelitis [facility])     Past Surgical History:  Procedure Laterality Date   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;  Surgeon: Toni Arthurs, MD;  Location: [facility];  Service: Orthopedics;  Laterality: Left;   AMPUTATION Right 10/22/2020   Procedure: Right Fifth ray amputation;  Surgeon: Toni Arthurs, MD;  Location: [facility];  Service: Orthopedics;  Laterality: Right;   WOUND DEBRIDEMENT Bilateral 10/02/2019   Procedure: DEBRIDEMENT WOUNDS BOTH LOWER EXTREMITIES WITH BONE BIOPSIES;  Surgeon: Vivi Barrack, DPM;  Location: [facility] OR;  Service: Podiatry;  Laterality: Bilateral;    Social History:  reports that he has never smoked. He has never used smokeless tobacco. He reports that he does not drink alcohol and does not use drugs.  Allergies  Allergen Reactions   Basaglar Kwikpen [Insulin Glargine] Diarrhea   Metformin And Related Diarrhea and Nausea And Vomiting   Trulicity [Dulaglutide] Diarrhea    Family History  Problem Relation Age of Onset   Hypertension Other    Diabetes Other    Healthy Mother    Colon cancer Neg Hx  Stomach cancer Neg Hx    Pancreatic cancer Neg Hx    Esophageal cancer Neg Hx      Prior to Admission medications   Medication Sig Start Date End Date Taking? Authorizing Provider  benzonatate (TESSALON) 100 MG capsule Take 1 capsule (100 mg total) by mouth every 8 (eight) hours. 08/02/20   Rushie Chestnutovington, Sarah M, PA-C  Continuous Blood Gluc Sensor (FREESTYLE LIBRE 14 DAY SENSOR) MISC Inject 1 patch into  the skin every 14 (fourteen) days.    [provider]  Continuous Blood Gluc Sensor MISC 1 each by Does not apply route as directed. Use as directed every 14 days. May dispense FreeStyle [employer] S[employer] or similar. 06/30/17   Darlin DropHall, Carole N, DO  doxycycline (VIBRA-TABS) 100 MG tablet Take 1 tablet (100 mg total) by mouth 2 (two) times daily. 01/27/20   Vivi BarrackWagoner, Matthew R, DPM  insulin aspart (NOVOLOG) 100 UNIT/ML injection Inject 0-15 Units into the skin 3 (three) times daily with meals. Patient taking differently: Inject 7-12 Units into the skin 3 (three) times daily as needed (before meals, if BGL is 250 or greater). 07/01/17   Lonia BloodMcClung, Jeffrey T, MD  Insulin Disposable Pump (OMNIPOD DASH 5 PACK PODS) MISC Inject into the skin. 01/10/20   [provider]  insulin glargine (LANTUS SOLOSTAR) 100 UNIT/ML Solostar Pen Inject 40 Units into the skin daily before breakfast. Patient taking differently: Inject 60 Units into the skin daily before breakfast. 08/20/19   Gwyneth SproutPlunkett, Whitney, MD  lidocaine (LIDODERM) 5 % Place 1 patch onto the skin daily. Remove & Discard patch within 12 hours or as directed by MD 03/30/21   Redwine, Madison A, PA-C  naproxen (NAPROSYN) 500 MG tablet Take 1 tablet (500 mg total) by mouth 2 (two) times daily. 03/30/21   Redwine, Madison A, PA-C  ondansetron (ZOFRAN) 4 MG tablet Take 1 tablet (4 mg total) by mouth every 8 (eight) hours as needed for nausea or vomiting. 09/10/21   Darrick GrinderMcCauley, Larry B, PA-C  SANTYL ointment Apply topically daily. 10/02/19   [provider]  silver sulfADIAZINE (SILVADENE) 1 % cream Apply pea-sized amount to wound daily. 10/10/19   Park LiterPrice, Michael J, DPM    Physical Exam: Vitals:   09/12/21 1204 09/12/21 1527 09/12/21 1812 09/12/21 1814  BP: 128/76 126/69 (!) 148/77   Pulse: 95 95 96   Resp: 16 18 17    Temp: 99.1 F (37.3 C)   (!) 100.7 F (38.2 C)  TempSrc: Oral   Oral  SpO2: 91% 96% 95%    Constitutional: Resting in bed,  NAD, calm, comfortable Eyes: EOMI, lids and conjunctivae normal ENMT: Mucous membranes are dry. Posterior pharynx clear of any exudate or lesions.Normal dentition.  Neck: normal, supple, no masses. Respiratory: clear to auscultation bilaterally, no wheezing, no crackles. Normal respiratory effort. No accessory muscle use.  Cardiovascular: Regular rate and rhythm, no murmurs / rubs / gallops. No extremity edema. 2+ pedal pulses. Abdomen: RUQ tenderness, no masses palpated. No hepatosplenomegaly. Bowel sounds diminished.  Musculoskeletal: S/p bilateral fifth ray amputations of the feet, wound dressing in place.  No clubbing / cyanosis. No joint deformity upper and lower extremities. Good ROM, no contractures. Normal muscle tone.  Skin: Warm, dry Neurologic: Sensation intact. Strength 5/5 in all 4.  Psychiatric: Normal judgment and insight. Alert and oriented x 3. Normal mood.   EKG: Not performed.  Assessment/Plan Principal Problem:   Nausea and vomiting Active Problems:   Type 2 diabetes mellitus with complication, with  long-term current use of insulin (HCC)   [name] is a 36 y.o. 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  Assessment and Plan: * Nausea and vomiting Admitted with persistent nausea vomiting ongoing for 1 week.  Has fever up to 100.7 F.  CT A/P 09/10/21 negative for acute etiology, did show mild splenomegaly.  Still not able to maintain any adequate oral intake.  Suspect viral gastroenteritis although patient does have mild RUQ abdominal pain.  Had 1 episode of diarrhea. -Start on IV Reglan 10 mg q8h with Zofran as needed -Continue IV fluid hydration overnight -Obtain abdominal ultrasound -Advance diet as tolerated  Type 2 diabetes mellitus with complication, with long-term current use of insulin  (HCC) Reports inconsistent use with insulin at home.  Start on Semglee 30 units daily plus SSI.  S/p bilateral fifth ray amputation with chronic foot wounds: Stable, following with wound care regularly.  No sign of active infection at this time.  DVT prophylaxis: Lovenox Code Status: Full code, confirmed with patient Family Communication: Discussed with patient, he has discussed with family Disposition Plan: From home and likely discharge to home pending clinical progress Consults called: None Severity of Illness: The appropriate patient status for this patient is OBSERVATION. Observation status is judged to be reasonable and necessary in order to provide the required intensity of service to ensure the patient's safety. The patient's presenting symptoms, physical exam findings, and initial radiographic and laboratory data in the context of their medical condition is felt to place them at decreased risk for further clinical deterioration. Furthermore, it is anticipated that the patient will be medically stable for discharge from the hospital within 2 midnights of admission.   Darreld Mclean MD [facility]  If 7PM-7AM, please contact night-coverage [email or web address]  09/12/2021, 9:14 PM

## 2021-09-12 NOTE — Assessment & Plan Note (Signed)
Reports inconsistent use with insulin at home.  Start on Semglee 30 units daily plus SSI.

## 2021-09-13 ENCOUNTER — Encounter (HOSPITAL_COMMUNITY): Payer: Self-pay | Admitting: Student

## 2021-09-13 ENCOUNTER — Inpatient Hospital Stay (HOSPITAL_COMMUNITY): Payer: BC Managed Care – PPO

## 2021-09-13 DIAGNOSIS — R112 Nausea with vomiting, unspecified: Secondary | ICD-10-CM | POA: Diagnosis not present

## 2021-09-13 DIAGNOSIS — D7389 Other diseases of spleen: Secondary | ICD-10-CM

## 2021-09-13 DIAGNOSIS — N179 Acute kidney failure, unspecified: Secondary | ICD-10-CM | POA: Diagnosis not present

## 2021-09-13 DIAGNOSIS — E66813 Obesity, class 3: Secondary | ICD-10-CM

## 2021-09-13 DIAGNOSIS — E103412 Type 1 diabetes mellitus with severe nonproliferative diabetic retinopathy with macular edema, left eye: Secondary | ICD-10-CM

## 2021-09-13 DIAGNOSIS — E10621 Type 1 diabetes mellitus with foot ulcer: Secondary | ICD-10-CM | POA: Diagnosis not present

## 2021-09-13 DIAGNOSIS — E103411 Type 1 diabetes mellitus with severe nonproliferative diabetic retinopathy with macular edema, right eye: Secondary | ICD-10-CM

## 2021-09-13 DIAGNOSIS — E1065 Type 1 diabetes mellitus with hyperglycemia: Secondary | ICD-10-CM | POA: Diagnosis not present

## 2021-09-13 DIAGNOSIS — L97509 Non-pressure chronic ulcer of other part of unspecified foot with unspecified severity: Secondary | ICD-10-CM

## 2021-09-13 DIAGNOSIS — D649 Anemia, unspecified: Secondary | ICD-10-CM

## 2021-09-13 LAB — URINALYSIS, ROUTINE W REFLEX MICROSCOPIC
Bilirubin Urine: NEGATIVE
Glucose, UA: NEGATIVE mg/dL
Ketones, ur: NEGATIVE mg/dL
Nitrite: NEGATIVE
Protein, ur: 30 mg/dL — AB
Specific Gravity, Urine: 1.024 (ref 1.005–1.030)
pH: 5 (ref 5.0–8.0)

## 2021-09-13 LAB — GLUCOSE, CAPILLARY
Glucose-Capillary: 182 mg/dL — ABNORMAL HIGH (ref 70–99)
Glucose-Capillary: 113 mg/dL — ABNORMAL HIGH (ref 70–99)

## 2021-09-13 LAB — COMPREHENSIVE METABOLIC PANEL
ALT: 13 U/L (ref 0–44)
AST: 19 U/L (ref 15–41)
Albumin: 2.2 g/dL — ABNORMAL LOW (ref 3.5–5.0)
Alkaline Phosphatase: 66 U/L (ref 38–126)
Anion gap: 10 (ref 5–15)
BUN: 13 mg/dL (ref 6–20)
CO2: 26 mmol/L (ref 22–32)
Calcium: 9 mg/dL (ref 8.9–10.3)
Chloride: 100 mmol/L (ref 98–111)
Creatinine, Ser: 1.27 mg/dL — ABNORMAL HIGH (ref 0.61–1.24)
GFR, Estimated: >60 mL/min (ref >60)
Glucose, Bld: 151 mg/dL — ABNORMAL HIGH (ref 70–99)
Potassium: 4.5 mmol/L (ref 3.5–5.1)
Sodium: 136 mmol/L (ref 135–145)
Total Bilirubin: 1.1 mg/dL (ref 0.3–1.2)
Total Protein: 7.1 g/dL (ref 6.5–8.1)

## 2021-09-13 LAB — HEMOGLOBIN A1C
Hgb A1c MFr Bld: 8 % — ABNORMAL HIGH (ref 4.8–5.6)
Mean Plasma Glucose: 182.9 mg/dL

## 2021-09-13 LAB — CBC
HCT: 34.6 % — ABNORMAL LOW (ref 39.0–52.0)
Hemoglobin: 10.9 g/dL — ABNORMAL LOW (ref 13.0–17.0)
MCH: 26.3 pg (ref 26.0–34.0)
MCHC: 31.5 g/dL (ref 30.0–36.0)
MCV: 83.6 fL (ref 80.0–100.0)
Platelets: 312 10*3/uL (ref 150–400)
RBC: 4.14 MIL/uL — ABNORMAL LOW (ref 4.22–5.81)
RDW: 13.6 % (ref 11.5–15.5)
WBC: 6.9 10*3/uL (ref 4.0–10.5)
nRBC: 0 % (ref 0.0–0.2)

## 2021-09-13 LAB — HIV ANTIBODY (ROUTINE TESTING W REFLEX): HIV Screen 4th Generation wRfx: NONREACTIVE

## 2021-09-13 LAB — CBG MONITORING, ED
Glucose-Capillary: 162 mg/dL — ABNORMAL HIGH (ref 70–99)
Glucose-Capillary: 173 mg/dL — ABNORMAL HIGH (ref 70–99)

## 2021-09-13 LAB — MAGNESIUM: Magnesium: 1.8 mg/dL (ref 1.7–2.4)

## 2021-09-13 MED ORDER — GADOBUTROL 1 MMOL/ML IV SOLN
10.0000 mL | Freq: Once | INTRAVENOUS | Status: AC | PRN
Start: 2021-09-13 — End: 2021-09-13
  Administered 2021-09-13: 10 mL via INTRAVENOUS

## 2021-09-13 MED ORDER — LACTATED RINGERS IV SOLN: INTRAVENOUS | Status: DC

## 2021-09-13 MED ORDER — METOCLOPRAMIDE HCL 5 MG/ML IJ SOLN
5.0000 mg | Freq: Three times a day (TID) | INTRAMUSCULAR | Status: DC
  Administered 2021-09-13 (×3): 5 mg via INTRAVENOUS
  Filled 2021-09-13 (×2): qty 2

## 2021-09-13 NOTE — Progress Notes (Signed)
NEW ADMISSION NOTE New Admission Note:   Arrival Method:  Stretcher Mental Orientation:  A&O x 4 Telemetry: not ordered Assessment: Completed Skin: intact diabetic ulcers bilateral feet see assement and notes IV: infusing left AC Pain: states zero at this time Tubes: none present Safety Measures: Safety Fall Prevention Plan has been given, discussed and signed Admission: Completed [facility] Orientation: Patient has been orientated to the room, unit and staff.  Family: wife bedside  Orders have been reviewed and implemented. Will continue to monitor the patient. Call light has been placed within reach and bed alarm has been activated.   Velia Meyer, RN

## 2021-09-13 NOTE — ED Notes (Signed)
Pt ambulated to restroom and was able to obtain a urinalysis sample.

## 2021-09-13 NOTE — ED Notes (Signed)
Pt in hall A&O x4. Tolerated Clear liq diet this AM. NAD pt denies any needs at this time. Calm and cooperative with staff. Updated on plan of care.

## 2021-09-13 NOTE — Plan of Care (Signed)
  Problem: Skin Integrity: Goal: Risk for impaired skin integrity will decrease Outcome: Progressing   

## 2021-09-13 NOTE — ED Notes (Signed)
PT TO REMAIN NPO UNTIL MRI COMES TO GET HIM AROUND 6:30. PT AWARE OF THIS.

## 2021-09-13 NOTE — Progress Notes (Signed)
PROGRESS NOTE  [name] GDJ:[id number] DOB: [birth year]   PCP: [name], MD  Patient is from: Home.  Lives with his wife.  DOA: 09/12/2021 LOS: 0  Chief complaints Nausea, vomiting, abdominal pain and fever    Brief Narrative / Interim history: 36 year old M with history of uncontrolled DM-1 with retinopathy, neuropathy and osteomyelitis s/p bilateral fifth ray amputation with residual chronic wound, and morbid obesity returning with intractable nausea and vomiting that did not improve with Zofran he was given when he was seen in ED 2 days prior.  Patient was seen in ED 2 days prior with 1 week of N/V.  Labs showed mild AKI.  CT A/P showed scattered colonic diverticulosis without acute diverticulitis, hepatomegaly and mild splenomegaly.  He was given IV fluid and discharged on p.o. Zofran.  Patient was febrile to 100.7 F.  Other vital stable. Na 130. Cr 1.36 (baseline 1.11).  Hgb 11.7.  Lipase within normal.  COVID-19 and influenza PCR nonreactive.  CXR with bibasilar opacities.  UA with trace LE and few bacteria.  Abdominal US showed 3.1 cm x 3.5 cm x 2.8 cm hyperechoic lesion is seen within the spleen, for which MRI was recommended.  Patient was started on IV fluid, scheduled Reglan with as needed Zofran and admitted for intractable nausea and vomiting with concern for possible gastroenteritis.  Subjective: Seen and examined earlier this morning.  Objective: Vitals:   09/12/21 2239 09/13/21 0255 09/13/21 0754 09/13/21 1259  BP: 98/76 121/66 (!) 159/97 139/85  Pulse: 90 70 79 78  Resp: 18 17 17 16   Temp:  98.5 F (36.9 C) 99 F (37.2 C)   TempSrc:  Oral Oral   SpO2: 95% 93% 94% 93%    Examination:  GENERAL: No apparent distress.  Nontoxic. HEENT: MMM.  Vision and hearing grossly intact.  NECK: Supple.  No apparent JVD.  RESP:  No IWOB.  Fair aeration bilaterally. CVS:  RRR. Heart sounds normal.  ABD/GI/GU: BS+. Abd soft, NTND.  MSK/EXT:  Moves extremities. No  apparent deformity.  Trace BLE edema. SKIN: New dressing over bilateral feet.  Trace BLE edema. NEURO: Awake, alert and oriented appropriately.  No apparent focal neuro deficit. PSYCH: Calm. Normal affect.   Procedures:  None  Microbiology summarized: COVID-19 and influenza PCR nonreactive.  Assessment and plan: Principal Problem:   Intractable nausea and vomiting Active Problems:   Uncontrolled DM-1 with hyperglycemia, retinopathy, cataract and b/l feet ulcer   AKI (acute kidney injury) (HCC)   Severe nonproliferative diabetic retinopathy of right eye, with macular edema, associated with type 1 diabetes mellitus (HCC)   Severe nonproliferative diabetic retinopathy of left eye, with macular edema, associated with type 1 diabetes mellitus (HCC)   Normocytic anemia   Diabetic foot ulcer associated with type 1 diabetes mellitus (HCC)   Obesity, Class III, BMI 40-49.9 (morbid obesity) (HCC)   Intractable nausea and vomiting with abdominal pain and fever: Concern about viral gastroenteritis but he has not had diarrhea other than one episode of loose stool prior to coming.  No URI symptoms or known sick contact but he works in public school.  Another possibility would be diabetic gastropathy from uncontrolled diabetes.  Abdominal exam is benign.  Blood cultures NGTD.  LFT and lipase within normal.  Improved with scheduled Reglan. -Decrease Reglan to 5 mg and changed to ACHS -Continue IV fluid -Advance to full liquid diet.  Uncontrolled DM-1 with hyperglycemia, retinopathy, cataract and bilateral feet ulcer: A1c 8.0%. Recent Labs  Lab 09/10/21  1603 09/12/21 1206 09/12/21 2132 09/13/21 0754 09/13/21 1150  GLUCAP 285* 275* 123* 162* 173*  -Continue Semglee 30 units daily -Continue SSI-resistant -Added NovoLog 4 units 3 times daily with meals -Needs a statin unless intolerant.  AKI: Baseline seems to be 1.11.  Likely prerenal in the setting of GI loss. Recent Labs    10/21/20 1254  09/10/21 1638 09/12/21 1213 09/13/21 0612  BUN 15 18 15 13   CREATININE 1.11 1.44* 1.36* 1.27*  -Continue IV fluid -Monitor  Splenic lesion: Nonspecific splenic lesion noted on abdominal US. -Discussed with radiology and ordered MRI abdomen w wo contrast  Normocytic anemia: No report of melena or hematochezia.  Likely dilutional from IV fluid Recent Labs    09/10/21 1638 09/12/21 1213 09/13/21 0612  HGB 12.4* 11.7* 10.9*  -Check anemia panel in the morning -Continue monitoring  Morbid obesity: BMI 41.99. -Encourage lifestyle change to lose weight  Bilateral feet ulcer s/p bilateral fifth ray amputation for osteomyelitis: Followed at wound care.  No concern for infection. -WOCN consulted  DVT prophylaxis:  SCD pending MRI result  Code Status: Full code Family Communication: Updated patient's wife at bedside. Level of care: Med-Surg Status is: Inpatient Remains inpatient appropriate because: Intractable nausea and vomiting, AKI and evaluation for splenic lesion   Final disposition: Home once medically clear. Consultants:  None  Sch Meds:  Scheduled Meds:  enoxaparin (LOVENOX) injection  80 mg Subcutaneous Q24H   insulin aspart  0-20 Units Subcutaneous TID WC   insulin aspart  0-5 Units Subcutaneous QHS   insulin glargine-yfgn  30 Units Subcutaneous Daily   metoCLOPramide (REGLAN) injection  5 mg Intravenous TID AC & HS   Continuous Infusions:  lactated ringers 100 mL/hr at 09/13/21 0847   PRN Meds:.acetaminophen **OR** acetaminophen, ondansetron **OR** ondansetron (ZOFRAN) IV, senna-docusate  Antimicrobials: Anti-infectives (From admission, onward)    None        I have personally reviewed the following labs and images: CBC: Recent Labs  Lab 09/10/21 1638 09/12/21 1213 09/13/21 0612  WBC 10.1 7.2 6.9  NEUTROABS  --  5.2  --   HGB 12.4* 11.7* 10.9*  HCT 38.4* 36.9* 34.6*  MCV 82.6 82.9 83.6  PLT 320 330 312   BMP &GFR Recent Labs  Lab  09/10/21 1638 09/12/21 1213 09/13/21 0612  NA 133* 130* 136  K 5.1 4.6 4.5  CL 98 94* 100  CO2 27 26 26   GLUCOSE 284* 282* 151*  BUN 18 15 13   CREATININE 1.44* 1.36* 1.27*  CALCIUM 9.5 9.1 9.0  MG  --   --  1.8   Estimated Creatinine Clearance: 136.4 mL/min (A) (by C-G formula based on SCr of 1.27 mg/dL (H)). Liver & Pancreas: Recent Labs  Lab 09/10/21 1638 09/12/21 1213 09/13/21 0612  AST 14* 14* 19  ALT 19 16 13   ALKPHOS 86 80 66  BILITOT 0.5 0.5 1.1  PROT 8.3* 7.6 7.1  ALBUMIN 2.9* 2.6* 2.2*   Recent Labs  Lab 09/10/21 1638 09/12/21 1213  LIPASE 18 19   No results for input(s): "AMMONIA" in the last 168 hours. Diabetic: Recent Labs    09/13/21 0641  HGBA1C 8.0*   Recent Labs  Lab 09/10/21 1603 09/12/21 1206 09/12/21 2132 09/13/21 0754 09/13/21 1150  GLUCAP 285* 275* 123* 162* 173*   Cardiac Enzymes: No results for input(s): "CKTOTAL", "CKMB", "CKMBINDEX", "TROPONINI" in the last 168 hours. No results for input(s): "PROBNP" in the last 8760 hours. Coagulation Profile: No results for input(s): "INR", "  PROTIME" in the last 168 hours. Thyroid Function Tests: No results for input(s): "TSH", "T4TOTAL", "FREET4", "T3FREE", "THYROIDAB" in the last 72 hours. Lipid Profile: No results for input(s): "CHOL", "HDL", "LDLCALC", "TRIG", "CHOLHDL", "LDLDIRECT" in the last 72 hours. Anemia Panel: No results for input(s): "VITAMINB12", "FOLATE", "FERRITIN", "TIBC", "IRON", "RETICCTPCT" in the last 72 hours. Urine analysis:    Component Value Date/Time   COLORURINE YELLOW 09/13/2021 0429   APPEARANCEUR CLOUDY (A) 09/13/2021 0429   LABSPEC 1.024 09/13/2021 0429   PHURINE 5.0 09/13/2021 0429   GLUCOSEU NEGATIVE 09/13/2021 0429   HGBUR SMALL (A) 09/13/2021 0429   BILIRUBINUR NEGATIVE 09/13/2021 0429   KETONESUR NEGATIVE 09/13/2021 0429   PROTEINUR 30 (A) 09/13/2021 0429   UROBILINOGEN 0.2 11/07/2011 2241   NITRITE NEGATIVE 09/13/2021 0429   LEUKOCYTESUR TRACE  (A) 09/13/2021 0429   Sepsis Labs: Invalid input(s): "PROCALCITONIN", "LACTICIDVEN"  Microbiology: Recent Results (from the past 240 hour(s))  Culture, blood (routine x 2)     Status: None (Preliminary result)   Collection Time: 09/12/21  6:16 PM   Specimen: BLOOD  Result Value Ref Range Status   Specimen Description BLOOD LEFT ANTECUBITAL  Final   Special Requests   Final    BOTTLES DRAWN AEROBIC AND ANAEROBIC Blood Culture adequate volume   Culture   Final    NO GROWTH < 12 HOURS Performed at [facility], [address], [place], [place] [address]    Report Status PENDING  Incomplete  Culture, blood (routine x 2)     Status: None (Preliminary result)   Collection Time: 09/12/21  6:35 PM   Specimen: BLOOD  Result Value Ref Range Status   Specimen Description BLOOD RIGHT ANTECUBITAL  Final   Special Requests   Final    BOTTLES DRAWN AEROBIC AND ANAEROBIC Blood Culture adequate volume   Culture   Final    NO GROWTH < 12 HOURS Performed at [facility], [address] [place], [place] [address]    Report Status PENDING  Incomplete  Resp Panel by RT-PCR (Flu A&B, Covid) Anterior Nasal Swab     Status: None   Collection Time: 09/12/21  9:35 PM   Specimen: Anterior Nasal Swab  Result Value Ref Range Status   SARS Coronavirus 2 by RT PCR NEGATIVE NEGATIVE Final    Comment: (NOTE) SARS-CoV-2 target nucleic acids are NOT DETECTED.  The SARS-CoV-2 RNA is generally detectable in upper respiratory specimens during the acute phase of infection. The lowest concentration of SARS-CoV-2 viral copies this assay can detect is 138 copies/mL. A negative result does not preclude SARS-Cov-2 infection and should not be used as the sole basis for treatment or other patient management decisions. A negative result may occur with  improper specimen collection/handling, submission of specimen other than nasopharyngeal swab, presence of viral mutation(s) within the areas targeted by  this assay, and inadequate number of viral copies(<138 copies/mL). A negative result must be combined with clinical observations, patient history, and epidemiological information. The expected result is Negative.  Fact Sheet for Patients:  BloggerCourse.com  Fact Sheet for Healthcare Providers:  SeriousBroker.it  This test is no t yet approved or cleared by the [place] FDA and  has been authorized for detection and/or diagnosis of SARS-CoV-2 by FDA under an Emergency Use Authorization (EUA). This EUA will remain  in effect (meaning this test can be used) for the duration of the COVID-19 declaration under Section 564(b)(1) of the Act, 21 U.S.C.section 360bbb-3(b)(1), unless the authorization  is terminated  or revoked sooner.       Influenza A by PCR NEGATIVE NEGATIVE Final   Influenza B by PCR NEGATIVE NEGATIVE Final    Comment: (NOTE) The Xpert Xpress SARS-CoV-2/FLU/RSV plus assay is intended as an aid in the diagnosis of influenza from Nasopharyngeal swab specimens and should not be used as a sole basis for treatment. Nasal washings and aspirates are unacceptable for Xpert Xpress SARS-CoV-2/FLU/RSV testing.  Fact Sheet for Patients: BloggerCourse.com  Fact Sheet for Healthcare Providers: SeriousBroker.it  This test is not yet approved or cleared by the [place] FDA and has been authorized for detection and/or diagnosis of SARS-CoV-2 by FDA under an Emergency Use Authorization (EUA). This EUA will remain in effect (meaning this test can be used) for the duration of the COVID-19 declaration under Section 564(b)(1) of the Act, 21 U.S.C. section 360bbb-3(b)(1), unless the authorization is terminated or revoked.  Performed at [facility], [address], [place], [place] [address]     Radiology Studies: US Abdomen Complete  Result Date:  09/12/2021 CLINICAL DATA:  Right upper quadrant abdominal tenderness. EXAM: ABDOMEN ULTRASOUND COMPLETE COMPARISON:  None Available. FINDINGS: Gallbladder: No gallstones or wall thickening visualized (1.9 mm). No sonographic Murphy sign noted by sonographer. Common bile duct: Diameter: 4.1 mm Liver: No focal lesion identified. Within normal limits in parenchymal echogenicity. Portal vein is patent on color Doppler imaging with normal direction of blood flow towards the liver. IVC: No abnormality visualized. Pancreas: Poorly visualized secondary to overlying bowel gas. Spleen: Size (7.9 cm) is within normal limits. A 3.1 cm x 3.5 cm x 2.8 cm hyperechoic lesion is seen within the spleen. Right Kidney: Length: 11.5 cm. Echogenicity within normal limits. No mass or hydronephrosis visualized. Left Kidney: Length: 12.4 cm. Echogenicity within normal limits. No mass or hydronephrosis visualized. Abdominal aorta: No aneurysm visualized (3.3 cm in AP diameter). Other findings: Markedly limited study secondary to the patient's body habitus and overlying bowel gas. IMPRESSION: Hyperechoic splenic lesion which may represent a splenic hemangioma. Correlation with follow-up MRI is recommended to confirm stability and further exclude the presence of an underlying neoplastic process. Electronically Signed   By: Aram Candela M.D.   On: 09/12/2021 22:29   DG Chest 2 View  Result Date: 09/12/2021 CLINICAL DATA:  Fever common nausea, vomiting EXAM: CHEST - 2 VIEW COMPARISON:  08/02/2020 FINDINGS: Low lung volumes. Vascular congestion with bibasilar airspace opacities, most confluent posteriorly on the lateral view. This could reflect atelectasis although pneumonia cannot be excluded. No effusions or acute bony abnormality. IMPRESSION: Low lung volumes.  Bibasilar atelectasis or pneumonia. Electronically Signed   By: Charlett Nose M.D.   On: 09/12/2021 21:32      Dorsey Charette T. Lyanna Blystone [facility]  If 7PM-7AM, please contact  night-coverage [email or web address] 09/13/2021, 1:42 PM

## 2021-09-13 NOTE — ED Notes (Signed)
Pt upset that he is still in the hallway. Tried to educate pt that although people are moving in the ER we hadn't gotten a bed for many people. Pt not receptive to information provided. Attempted to locate charge RN with no success. Supervisor notified.

## 2021-09-13 NOTE — Consult Note (Signed)
WOC Nurse Consult Note: Patient receiving care in [facility] Hallway space 20. Spouse present. Reason for Consult: bilateral foot wounds, followed at wound center by Dr. Lady Gary. I explained to the patient and his wife that we do not carry the same types of wound care products as the wound center. Wound type: full thickness diabetic foot wounds Pressure Injury POA: Yes/No/NA Measurement: I did not remove the packing from the wounds for 2 reasons. Firstly, even using a privacy screen, there was really no privacy to be had in the hallway. And, secondly, the products to use to redress the wounds had to be ordered by a Licensed conveyancer. Wound bed: What I could see was red Drainage (amount, consistency, odor) no odor Periwound:intact Dressing procedure/placement/frequency: Remove all packing from the wounds on the lateral side of both feet. Remove dressing from the right foot dorsal wound. Cleanse all with saline. Place Aquacel Advantage [place] [phone]) into the wounds on the lateral sides of the feet. Be sure to insert the dressing as far into the wounds as possible. Then cover each lateral wound with a foam dressing [place] [phone]). Place a small piece of Aquacel over the dorsal foot wound, then dry gauze. Lightly wrap the feet with kerlix, then 4 inch Ace wraps [place] [phone]). Perform daily.  I asked the main ED entrance [name] to order 6 Aquacel, 4 6 x 6 mepitel, and 4 4 inch ace wraps. The primary RN is aware that I will place orders in the computer and ask the [name] to order the needed supplies, and that the bedside nurse is to provide the care.  Thank you for the consult.  Discussed plan of care with the patient and bedside nurse.  WOC nurse will not follow at this time.  Please re-consult the WOC team if needed.  Helmut Muster, RN, MSN, CWOCN, CNS-BC, pager [phone]

## 2021-09-14 DIAGNOSIS — K760 Fatty (change of) liver, not elsewhere classified: Secondary | ICD-10-CM

## 2021-09-14 DIAGNOSIS — E1065 Type 1 diabetes mellitus with hyperglycemia: Secondary | ICD-10-CM | POA: Diagnosis not present

## 2021-09-14 DIAGNOSIS — N179 Acute kidney failure, unspecified: Secondary | ICD-10-CM | POA: Diagnosis not present

## 2021-09-14 DIAGNOSIS — R112 Nausea with vomiting, unspecified: Secondary | ICD-10-CM | POA: Diagnosis not present

## 2021-09-14 LAB — CBC
HCT: 33.9 % — ABNORMAL LOW (ref 39.0–52.0)
Hemoglobin: 10.4 g/dL — ABNORMAL LOW (ref 13.0–17.0)
MCH: 25.2 pg — ABNORMAL LOW (ref 26.0–34.0)
MCHC: 30.7 g/dL (ref 30.0–36.0)
MCV: 82.3 fL (ref 80.0–100.0)
Platelets: 319 10*3/uL (ref 150–400)
RBC: 4.12 MIL/uL — ABNORMAL LOW (ref 4.22–5.81)
RDW: 13.5 % (ref 11.5–15.5)
WBC: 6.4 10*3/uL (ref 4.0–10.5)
nRBC: 0 % (ref 0.0–0.2)

## 2021-09-14 LAB — RENAL FUNCTION PANEL
Albumin: 2.2 g/dL — ABNORMAL LOW (ref 3.5–5.0)
Anion gap: 8 (ref 5–15)
BUN: 9 mg/dL (ref 6–20)
CO2: 26 mmol/L (ref 22–32)
Calcium: 8.9 mg/dL (ref 8.9–10.3)
Chloride: 101 mmol/L (ref 98–111)
Creatinine, Ser: 1.16 mg/dL (ref 0.61–1.24)
GFR, Estimated: >60 mL/min (ref >60)
Glucose, Bld: 147 mg/dL — ABNORMAL HIGH (ref 70–99)
Phosphorus: 3.8 mg/dL (ref 2.5–4.6)
Potassium: 4.2 mmol/L (ref 3.5–5.1)
Sodium: 135 mmol/L (ref 135–145)

## 2021-09-14 LAB — MAGNESIUM: Magnesium: 1.8 mg/dL (ref 1.7–2.4)

## 2021-09-14 LAB — IRON AND TIBC
Iron: 21 ug/dL — ABNORMAL LOW (ref 45–182)
Saturation Ratios: 12 % — ABNORMAL LOW (ref 17.9–39.5)
TIBC: 182 ug/dL — ABNORMAL LOW (ref 250–450)
UIBC: 161 ug/dL

## 2021-09-14 LAB — FERRITIN: Ferritin: 264 ng/mL (ref 24–336)

## 2021-09-14 LAB — VITAMIN B12: Vitamin B-12: 857 pg/mL (ref 180–914)

## 2021-09-14 LAB — RETICULOCYTES
Immature Retic Fract: 15.3 % (ref 2.3–15.9)
RBC.: 4.13 MIL/uL — ABNORMAL LOW (ref 4.22–5.81)
Retic Count, Absolute: 38.4 10*3/uL (ref 19.0–186.0)
Retic Ct Pct: 0.9 % (ref 0.4–3.1)

## 2021-09-14 LAB — FOLATE: Folate: 12.3 ng/mL (ref >5.9)

## 2021-09-14 LAB — GLUCOSE, CAPILLARY: Glucose-Capillary: 142 mg/dL — ABNORMAL HIGH (ref 70–99)

## 2021-09-14 MED ORDER — METOCLOPRAMIDE HCL 5 MG PO TABS: ORAL_TABLET | ORAL | 0 refills | Status: DC

## 2021-09-14 MED ORDER — METOCLOPRAMIDE HCL 5 MG PO TABS
5.0000 mg | ORAL_TABLET | Freq: Three times a day (TID) | ORAL | Status: DC
Start: 2021-09-14 — End: 2021-09-14
  Administered 2021-09-14: 5 mg via ORAL
  Filled 2021-09-14: qty 1

## 2021-09-14 NOTE — TOC Transition Note (Signed)
Transition of Care [facility]) - CM/SW Discharge Note   Patient Details  Name: [name] MRN: [id number] Date of Birth: 11-06-1985  [facility] CM/SW Contact:  Tom-Johnson, Hershal Coria, RN Phone Number: 09/14/2021, 10:16 AM   Clinical Narrative:     Patient is scheduled for discharge today. No TOC needs or recommendations noted. Denies any needs. No further TOC needs noted.   Final next level of care: Home/Self Care Barriers to Discharge: Barriers Resolved   Patient Goals and CMS Choice Patient states their goals for this hospitalization and ongoing recovery are:: To return home CMS Medicare.gov Compare Post Acute Care list provided to:: Patient Choice offered to / list presented to : NA  Discharge Placement                Patient to be transferred to facility by: Wife      Discharge Plan and Services                DME Arranged: N/A DME Agency: NA       HH Arranged: NA HH Agency: NA        Social Determinants of Health (SDOH) Interventions     Readmission Risk Interventions     No data to display

## 2021-09-14 NOTE — Discharge Summary (Signed)
Physician Discharge Summary  [name] Sadowsky ZOX:[id number] DOB: May 21, 1985 DOA: 09/12/2021  PCP: Renford DillsPolite, Ronald, MD  Admit date: 09/12/2021 Discharge date: 09/14/2021 Admitted From: Home Disposition: Home Recommendations for Outpatient Follow-up:  Follow ups as below. Please obtain BMP and CBC at follow-up. Needs MRI abdomen with and without contrast in 3 months for follow-up on liver lesion Please follow up on the following pending results: None  Home Health: Not indicated Equipment/Devices: Not indicated  Discharge Condition: Stable CODE STATUS: Full code  Follow-up Information     Renford DillsPolite, Ronald, MD. Schedule an appointment as soon as possible for a visit in 1 week(s).   Specialty: Internal Medicine Contact information: [address] [place] [phone]                 Hospital course 36 year old M with history of uncontrolled DM-1 with retinopathy, neuropathy and osteomyelitis s/p bilateral fifth ray amputation with residual chronic wound, and morbid obesity returning with intractable nausea and vomiting that did not improve with Zofran he was given when he was seen in ED 2 days prior.   Patient was seen in ED 2 days prior with 1 week of N/V.  Labs showed mild AKI.  CT A/P showed scattered colonic diverticulosis without acute diverticulitis, hepatomegaly and mild splenomegaly.  He was given IV fluid and discharged on p.o. Zofran.   Patient was febrile to 100.7 F.  Other vital stable. Na 130. Cr 1.36 (baseline 1.11).  Hgb 11.7.  Lipase within normal.  COVID-19 and influenza PCR nonreactive.  CXR with bibasilar opacities.  UA with trace LE and few bacteria.  Abdominal US showed 3.1 cm x 3.5 cm x 2.8 cm hyperechoic lesion is seen within the spleen, for which MRI was recommended.  Patient was started on IV fluid, scheduled Reglan with as needed Zofran and admitted for intractable nausea and vomiting with concern for possible  gastroenteritis.  MRI abdomen showed hepatic steatosis and nonspecific 3.2 cm splenic lesion from 1.9 cm in 2019.  Repeat MRI in 3 months recommended.  This was discussed with patient prior to discharge.  On the day of discharge, patient felt well.  Nausea, vomiting and abdominal pain resolved.  He tolerated regular diet and felt well to go home.  He is discharged on p.o. Reglan 5 mg ACHS for 5 days followed by 3 times daily as needed.   See individual problem list below for more.   Problems addressed during this hospitalization Principal Problem:   Intractable nausea and vomiting Active Problems:   Uncontrolled DM-1 with hyperglycemia, retinopathy, cataract and b/l feet ulcer   AKI (acute kidney injury) (HCC)   Severe nonproliferative diabetic retinopathy of right eye, with macular edema, associated with type 1 diabetes mellitus (HCC)   Severe nonproliferative diabetic retinopathy of left eye, with macular edema, associated with type 1 diabetes mellitus (HCC)   Normocytic anemia   Diabetic foot ulcer associated with type 1 diabetes mellitus (HCC)   Obesity, Class III, BMI 40-49.9 (morbid obesity) (HCC)   Splenic lesion   Intractable nausea and vomiting with abdominal pain and fever: Concerning for gastroenteritis but difficult to rule out gastroparesis.  Symptoms resolved.  -Discharged on p.o. Reglan 5 mg ACHS for 5 days followed by 3 times daily as needed -Discussed the importance of good glycemic control -Encouraged oral hydration.   Uncontrolled DM-1 with hyperglycemia, retinopathy, cataract and bilateral feet ulcer: A1c 8.0%. Recent Labs  Lab 09/13/21 0754 09/13/21 1150 09/13/21 1725 09/13/21 2120  09/14/21 0722  GLUCAP 162* 173* 182* 113* 142*  -Discharged on home medications. -Discussed the importance of good glycemic control and lifestyle change.   AKI: Baseline seems to be 1.11.  Likely prerenal in the setting of GI loss.  Resolved. Recent Labs    10/21/20 1254  09/10/21 1638 09/12/21 1213 09/13/21 0612 09/14/21 0419  BUN CREATININE 1.11 1.44* 1.36* 1.27* 1.16  -Recheck renal function in 1 week   Splenic lesion: Nonspecific splenic lesion measuring 3.2 cm noted on ultrasound and MRI abdomen. -Repeat MRI in 3 months recommended   Normocytic anemia: No report of melena or hematochezia.  Likely dilutional from IV fluid.  Stable. -Repeat CBC in 1 week  Hepatic steatosis: Does not drink alcohol. -Encouraged weight loss.   Morbid obesity: BMI 41.99. -Encourage lifestyle change to lose weight   Bilateral feet ulcer s/p bilateral fifth ray amputation for osteomyelitis: Followed at wound care.  Evaluated by WOC[facility].  No concern for infection. -Outpatient follow-up   Vital signs Vitals:   09/13/21 2121 09/14/21 0125 09/14/21 0553 09/14/21 0915  BP: 119/71 (!) 153/89 109/61 110/68  Pulse: 89 80 94 90  Temp: (!) 100.5 F (38.1 C) 99.4 F (37.4 C) 97.7 F (36.5 C) 98 F (36.7 C)  Resp: Height:      Weight:      SpO2: 95% 92% 92% 93%  TempSrc: Oral Oral Oral Oral  BMI (Calculated):         Discharge exam  GENERAL: No apparent distress.  Nontoxic. HEENT: MMM.  Vision and hearing grossly intact.  NECK: Supple.  No apparent JVD.  RESP:  No IWOB.  Fair aeration bilaterally. CVS:  RRR. Heart sounds normal.  ABD/GI/GU: BS+. Abd soft, NTND.  MSK/EXT:  Moves extremities. No apparent deformity.  Trace BLE edema. SKIN: Dressing over bilateral feet DCI.  No signs of infection proximally. NEURO: Awake and alert. Oriented appropriately.  No apparent focal neuro deficit. PSYCH: Calm. Normal affect.   Discharge Instructions Discharge Instructions     Call MD for:  extreme fatigue   Complete by: As directed    Call MD for:  persistant dizziness or light-headedness   Complete by: As directed    Call MD for:  persistant nausea and vomiting   Complete by: As directed    Call MD for:  severe uncontrolled pain    Complete by: As directed    Call MD for:  temperature >100.4   Complete by: As directed    Diet - low sodium heart healthy   Complete by: As directed    Diet Carb Modified   Complete by: As directed    Discharge instructions   Complete by: As directed    It has been a pleasure taking care of you!  You were hospitalized due to nausea, vomiting and fever likely due to gastroenteritis (see separate instruction for more on gastroenteritis).  Uncontrolled diabetes could also contribute to gastroparesis that could manifest as nausea, vomiting and abdominal discomfort.  Your symptoms improved.  We are discharging you on Reglan to help with nausea and vomiting.  Your abdominal ultrasound and MRI showed a nonspecific abnormality within your spleen.  It is recommended that you have a repeat MRI in about 3 months to make sure there is no change.   It is also very important that you get your diabetes under good control.   Review your new medication list and the directions on  your medications before you take them for   Take care,   Discharge wound care:   Complete by: As directed    Continue wound care at wound clinic   Increase activity slowly   Complete by: As directed       Allergies as of 09/14/2021       Reactions   Basaglar Kwikpen [insulin Glargine] Diarrhea   Metformin And Related Diarrhea, Nausea And Vomiting   Trulicity [dulaglutide] Diarrhea        Medication List     STOP taking these medications    benzonatate 100 MG capsule Commonly known as: TESSALON   naproxen 500 MG tablet Commonly known as: NAPROSYN   ondansetron 4 MG tablet Commonly known as: Zofran       TAKE these medications    FreeStyle Libre 14 Day Sensor Misc Inject 1 patch into the skin every 14 (fourteen) days.   Continuous Blood Gluc Sensor Misc 1 each by Does not apply route as directed. Use as directed every 14 days. May dispense FreeStyle [employer] or similar.   insulin aspart  100 UNIT/ML injection Commonly known as: novoLOG Inject 0-15 Units into the skin 3 (three) times daily with meals. What changed:  how much to take when to take this reasons to take this   Lantus SoloStar 100 UNIT/ML Solostar Pen Generic drug: insulin glargine Inject 40 Units into the skin daily before breakfast. What changed: how much to take   lidocaine 5 % Commonly known as: Lidoderm Place 1 patch onto the skin daily. Remove & Discard patch within 12 hours or as directed by MD   metoCLOPramide 5 MG tablet Commonly known as: REGLAN Take 1 tablet (5 mg total) by mouth 4 (four) times daily -  before meals and at bedtime for 5 days, THEN 1 tablet (5 mg total) 3 (three) times daily as needed for up to 25 days for vomiting or nausea. Start taking on: September 14, 2021   Omnipod DASH Pods (Gen 4) Misc Inject into the skin.   silver sulfADIAZINE 1 % cream Commonly known as: Silvadene Apply pea-sized amount to wound daily.               Discharge Care Instructions  (From admission, onward)           Start     Ordered   09/14/21 0000  Discharge wound care:       Comments: Continue wound care at wound clinic   09/14/21 1013            Consultations: None  Procedures/Studies:   MR ABDOMEN W WO CONTRAST  Result Date: 09/13/2021 CLINICAL DATA:  Splenomegaly with splenic lesions. EXAM: MRI ABDOMEN WITHOUT AND WITH CONTRAST TECHNIQUE: Multiplanar multisequence MR imaging of the abdomen was performed both before and after the administration of intravenous contrast. CONTRAST:  63mL GADAVIST GADOBUTROL 1 MMOL/ML IV SOLN COMPARISON:  CT from September 10, 2021.  Also with CT from May of 2021 FINDINGS: Lower chest: Incidental imaging of the lung bases without signs of effusion or evidence of consolidative changes. Hepatobiliary: Hepatic steatosis is mild with smooth hepatic contours. No focal, suspicious hepatic lesion. No pericholecystic stranding. No biliary duct dilation.  Pancreas: Normal intrinsic T1 signal. No ductal dilation or sign of inflammation. No focal lesion. Spleen: Spleen with hypoenhancing lesions, largest measuring up to 3 cm (image 71/802) lesions are T2 bright and isointense to the spleen on T1 again showing low level enhancement. The largest measuring  1.9 cm on the exam of November of 2019. No signs of restricted diffusion. On more delayed coronal images the lesion shows some progressive enhancement is nearly isointense to the spleen surrounding the lesions. Adrenals/Urinary Tract: Normal appearance of the adrenal glands. Kidneys with symmetric enhancement and no suspicious renal lesion or hydronephrosis. Stomach/Bowel: Unremarkable to the extent evaluated on abdominal MRI. Vascular/Lymphatic: No pathologically enlarged lymph nodes identified. No abdominal aortic aneurysm demonstrated. Other:  None. Musculoskeletal: No suspicious bone lesions identified. IMPRESSION: 1. Splenic lesions with hypoenhancement. Dominant lesion measuring up to 3.2 cm which has enlarged since 2019. Enhancement with hypoenhancement and delayed enhancement is nonspecific. Presence over nearly 4 years time suggests this is relatively indolent process but increasing in size. The lack of heterogeneity would argue for a benign process but without characteristic enhancement features to definitively characterize at this time. Would suggest short interval follow-up in 3 months utilizing MRI with delays out to 7 minutes post contrast injection. Differential considerations at this time would be atypical splenic hemangioma or indolent vascular neoplasm of the spleen. 2. Hepatic steatosis. Electronically Signed   By: Donzetta Kohut M.D.   On: 09/13/2021 21:17   US Abdomen Complete  Result Date: 09/12/2021 CLINICAL DATA:  Right upper quadrant abdominal tenderness. EXAM: ABDOMEN ULTRASOUND COMPLETE COMPARISON:  None Available. FINDINGS: Gallbladder: No gallstones or wall thickening visualized (1.9  mm). No sonographic Murphy sign noted by sonographer. Common bile duct: Diameter: 4.1 mm Liver: No focal lesion identified. Within normal limits in parenchymal echogenicity. Portal vein is patent on color Doppler imaging with normal direction of blood flow towards the liver. IVC: No abnormality visualized. Pancreas: Poorly visualized secondary to overlying bowel gas. Spleen: Size (7.9 cm) is within normal limits. A 3.1 cm x 3.5 cm x 2.8 cm hyperechoic lesion is seen within the spleen. Right Kidney: Length: 11.5 cm. Echogenicity within normal limits. No mass or hydronephrosis visualized. Left Kidney: Length: 12.4 cm. Echogenicity within normal limits. No mass or hydronephrosis visualized. Abdominal aorta: No aneurysm visualized (3.3 cm in AP diameter). Other findings: Markedly limited study secondary to the patient's body habitus and overlying bowel gas. IMPRESSION: Hyperechoic splenic lesion which may represent a splenic hemangioma. Correlation with follow-up MRI is recommended to confirm stability and further exclude the presence of an underlying neoplastic process. Electronically Signed   By: Aram Candela M.D.   On: 09/12/2021 22:29   DG Chest 2 View  Result Date: 09/12/2021 CLINICAL DATA:  Fever common nausea, vomiting EXAM: CHEST - 2 VIEW COMPARISON:  08/02/2020 FINDINGS: Low lung volumes. Vascular congestion with bibasilar airspace opacities, most confluent posteriorly on the lateral view. This could reflect atelectasis although pneumonia cannot be excluded. No effusions or acute bony abnormality. IMPRESSION: Low lung volumes.  Bibasilar atelectasis or pneumonia. Electronically Signed   By: Charlett Nose M.D.   On: 09/12/2021 21:32   CT ABDOMEN PELVIS W CONTRAST  Result Date: 09/10/2021 CLINICAL DATA:  Abdominal pain, acute, nonlocalized. N/V x 1 week Patient has HX of DM2 EXAM: CT ABDOMEN AND PELVIS WITH CONTRAST TECHNIQUE: Multidetector CT imaging of the abdomen and pelvis was performed using the  standard protocol following bolus administration of intravenous contrast. RADIATION DOSE REDUCTION: This exam was performed according to the departmental dose-optimization program which includes automated exposure control, adjustment of the mA and/or kV according to patient size and/or use of iterative reconstruction technique. CONTRAST:  OMNIPAQUE IOHEXOL 300 MG/ML  SOLN COMPARISON:  CT abdomen pelvis 08/21/2019 FINDINGS: Lower chest: Bilateral lower lobe subsegmental  atelectasis. No acute abnormality. Hepatobiliary: The liver is enlarged measuring up to 23 cm. No focal liver abnormality. No gallstones, gallbladder wall thickening, or pericholecystic fluid. No biliary dilatation. Pancreas: No focal lesion. Normal pancreatic contour. No surrounding inflammatory changes. No main pancreatic ductal dilatation. Spleen: Interval increase in size of couple indeterminate splenic hypodensities measuring 3.3 cm (from 2 cm) and 1.3 cm (from 0.9cm). Interval development of a third vague hypodensity within the spleen (3:37). The spleen is mildly enlarged in size measuring up 13.5 cm. Adrenals/Urinary Tract: No adrenal nodule bilaterally. Bilateral kidneys enhance symmetrically. Subcentimeter hypodensities are too small to characterize. No hydronephrosis. No hydroureter. The urinary bladder is unremarkable. Stomach/Bowel: Stomach is within normal limits. No evidence of bowel wall thickening or dilatation. Scattered colonic diverticulosis. Appendix appears normal. Vascular/Lymphatic: No abdominal aorta or iliac aneurysm. No abdominal, pelvic, or inguinal lymphadenopathy. Reproductive: Prostate is unremarkable. Other: No intraperitoneal free fluid. No intraperitoneal free gas. No organized fluid collection. Musculoskeletal: Tiny fat containing umbilical hernia. No suspicious lytic or blastic osseous lesions. No acute displaced fracture. Multilevel degenerative changes of the spine. IMPRESSION: 1. Scattered colonic  diverticulosis with no acute diverticulitis. 2. Hepatomegaly. 3. Mild splenomegaly. Interval increase in size (compared to 2021) of couple indeterminate splenic hypodensities measuring 3.3 cm (from 2 cm) and 1.3 cm (from 0.9cm). Intervaldevelopment of a third vague splenic hypodensity. Electronically Signed   By: Tish Frederickson M.D.   On: 09/10/2021 20:01   DG Foot Complete Right  Result Date: 09/03/2021 CLINICAL DATA:  non healing wound EXAM: LEFT FOOT - COMPLETE 3+ VIEW; RIGHT FOOT COMPLETE - 3+ VIEW COMPARISON:  February 2023 November 2021 FINDINGS: Right foot: Similar postoperative changes status post proximal amputation of the fifth metatarsal. Similar periosteal reaction at the amputation site. No other acute osseous findings. Similar degenerative changes of the midfoot. Similar overlying density of the adjacent soft tissues, may be related to overlying dressing. Left foot: Stable postoperative changes of old amputation of the proximal fifth metatarsal. No new acute osseous abnormality. Density overlying the adjacent soft tissues may be related to dressing. IMPRESSION: Bilateral feet: 1. Similar postoperative changes of the right foot status post proximal amputation of the fifth metatarsal. 2. Stable chronic postoperative changes of left foot status post proximal amputation of the fifth metatarsal. No new acute osseous findings. Electronically Signed   By: Olive Bass M.D.   On: 09/03/2021 11:57   DG Foot Complete Left  Result Date: 09/03/2021 CLINICAL DATA:  non healing wound EXAM: LEFT FOOT - COMPLETE 3+ VIEW; RIGHT FOOT COMPLETE - 3+ VIEW COMPARISON:  February 2023 November 2021 FINDINGS: Right foot: Similar postoperative changes status post proximal amputation of the fifth metatarsal. Similar periosteal reaction at the amputation site. No other acute osseous findings. Similar degenerative changes of the midfoot. Similar overlying density of the adjacent soft tissues, may be related to overlying  dressing. Left foot: Stable postoperative changes of old amputation of the proximal fifth metatarsal. No new acute osseous abnormality. Density overlying the adjacent soft tissues may be related to dressing. IMPRESSION: Bilateral feet: 1. Similar postoperative changes of the right foot status post proximal amputation of the fifth metatarsal. 2. Stable chronic postoperative changes of left foot status post proximal amputation of the fifth metatarsal. No new acute osseous findings. Electronically Signed   By: Olive Bass M.D.   On: 09/03/2021 11:57       The results of significant diagnostics from this hospitalization (including imaging, microbiology, ancillary and laboratory) are listed below for reference.  Microbiology: Recent Results (from the past 240 hour(s))  Culture, blood (routine x 2)     Status: None (Preliminary result)   Collection Time: 09/12/21  6:16 PM   Specimen: BLOOD  Result Value Ref Range Status   Specimen Description BLOOD LEFT ANTECUBITAL  Final   Special Requests   Final    BOTTLES DRAWN AEROBIC AND ANAEROBIC Blood Culture adequate volume   Culture   Final    NO GROWTH 2 DAYS Performed at [facility], [address], [place], [place] [address]    Report Status PENDING  Incomplete  Culture, blood (routine x 2)     Status: None (Preliminary result)   Collection Time: 09/12/21  6:35 PM   Specimen: BLOOD  Result Value Ref Range Status   Specimen Description BLOOD RIGHT ANTECUBITAL  Final   Special Requests   Final    BOTTLES DRAWN AEROBIC AND ANAEROBIC Blood Culture adequate volume   Culture   Final    NO GROWTH 2 DAYS Performed at [facility], [address] [place], [place] [address]    Report Status PENDING  Incomplete  Resp Panel by RT-PCR (Flu A&B, Covid) Anterior Nasal Swab     Status: None   Collection Time: 09/12/21  9:35 PM   Specimen: Anterior Nasal Swab  Result Value Ref Range Status   SARS Coronavirus 2 by RT PCR NEGATIVE  NEGATIVE Final    Comment: (NOTE) SARS-CoV-2 target nucleic acids are NOT DETECTED.  The SARS-CoV-2 RNA is generally detectable in upper respiratory specimens during the acute phase of infection. The lowest concentration of SARS-CoV-2 viral copies this assay can detect is 138 copies/mL. A negative result does not preclude SARS-Cov-2 infection and should not be used as the sole basis for treatment or other patient management decisions. A negative result may occur with  improper specimen collection/handling, submission of specimen other than nasopharyngeal swab, presence of viral mutation(s) within the areas targeted by this assay, and inadequate number of viral copies(<138 copies/mL). A negative result must be combined with clinical observations, patient history, and epidemiological information. The expected result is Negative.  Fact Sheet for Patients:  BloggerCourse.com  Fact Sheet for Healthcare Providers:  SeriousBroker.it  This test is no t yet approved or cleared by the [place] FDA and  has been authorized for detection and/or diagnosis of SARS-CoV-2 by FDA under an Emergency Use Authorization (EUA). This EUA will remain  in effect (meaning this test can be used) for the duration of the COVID-19 declaration under Section 564(b)(1) of the Act, 21 U.S.C.section 360bbb-3(b)(1), unless the authorization is terminated  or revoked sooner.       Influenza A by PCR NEGATIVE NEGATIVE Final   Influenza B by PCR NEGATIVE NEGATIVE Final    Comment: (NOTE) The Xpert Xpress SARS-CoV-2/FLU/RSV plus assay is intended as an aid in the diagnosis of influenza from Nasopharyngeal swab specimens and should not be used as a sole basis for treatment. Nasal washings and aspirates are unacceptable for Xpert Xpress SARS-CoV-2/FLU/RSV testing.  Fact Sheet for Patients: BloggerCourse.com  Fact Sheet for Healthcare  Providers: SeriousBroker.it  This test is not yet approved or cleared by the [place] FDA and has been authorized for detection and/or diagnosis of SARS-CoV-2 by FDA under an Emergency Use Authorization (EUA). This EUA will remain in effect (meaning this test can be used) for the duration of the COVID-19 declaration under Section 564(b)(1) of the Act, 21 U.S.C. section 360bbb-3(b)(1), unless the authorization  is terminated or revoked.  Performed at [facility], [address], [place], [place] [address]      Labs:  CBC: Recent Labs  Lab 09/10/21 1638 09/12/21 1213 09/13/21 0612 09/14/21 0419  WBC 10.1 7.2 6.9 6.4  NEUTROABS  --  5.2  --   --   HGB 12.4* 11.7* 10.9* 10.4*  HCT 38.4* 36.9* 34.6* 33.9*  MCV 82.6 82.9 83.6 82.3  PLT 320 330 312 319   BMP &GFR Recent Labs  Lab 09/10/21 1638 09/12/21 1213 09/13/21 0612 09/14/21 0419  NA 133* 130* 136 135  K 5.1 4.6 4.5 4.2  CL 98 94* 100 101  CO2 GLUCOSE 284* 282* 151* 147*  BUN CREATININE 1.44* 1.36* 1.27* 1.16  CALCIUM 9.5 9.1 9.0 8.9  MG  --   --  1.8 1.8  PHOS  --   --   --  3.8   Estimated Creatinine Clearance: 148.6 mL/min (by C-G formula based on SCr of 1.16 mg/dL). Liver & Pancreas: Recent Labs  Lab 09/10/21 1638 09/12/21 1213 09/13/21 0612 09/14/21 0419  AST 14* 14* 19  --   ALT --   ALKPHOS 86 80 66  --   BILITOT 0.5 0.5 1.1  --   PROT 8.3* 7.6 7.1  --   ALBUMIN 2.9* 2.6* 2.2* 2.2*   Recent Labs  Lab 09/10/21 1638 09/12/21 1213  LIPASE 18 19   No results for input(s): "AMMONIA" in the last 168 hours. Diabetic: Recent Labs    09/13/21 0641  HGBA1C 8.0*   Recent Labs  Lab 09/13/21 0754 09/13/21 1150 09/13/21 1725 09/13/21 2120 09/14/21 0722  GLUCAP 162* 173* 182* 113* 142*   Cardiac Enzymes: No results for input(s): "CKTOTAL", "CKMB", "CKMBINDEX", "TROPONINI" in the last 168 hours. No results for input(s):  "PROBNP" in the last 8760 hours. Coagulation Profile: No results for input(s): "INR", "PROTIME" in the last 168 hours. Thyroid Function Tests: No results for input(s): "TSH", "T4TOTAL", "FREET4", "T3FREE", "THYROIDAB" in the last 72 hours. Lipid Profile: No results for input(s): "CHOL", "HDL", "LDLCALC", "TRIG", "CHOLHDL", "LDLDIRECT" in the last 72 hours. Anemia Panel: Recent Labs    09/14/21 0419  VITAMINB12 857  FOLATE 12.3  FERRITIN 264  TIBC 182*  IRON 21*  RETICCTPCT 0.9   Urine analysis:    Component Value Date/Time   COLORURINE YELLOW 09/13/2021 0429   APPEARANCEUR CLOUDY (A) 09/13/2021 0429   LABSPEC 1.024 09/13/2021 0429   PHURINE 5.0 09/13/2021 0429   GLUCOSEU NEGATIVE 09/13/2021 0429   HGBUR SMALL (A) 09/13/2021 0429   BILIRUBINUR NEGATIVE 09/13/2021 0429   KETONESUR NEGATIVE 09/13/2021 0429   PROTEINUR 30 (A) 09/13/2021 0429   UROBILINOGEN 0.2 11/07/2011 2241   NITRITE NEGATIVE 09/13/2021 0429   LEUKOCYTESUR TRACE (A) 09/13/2021 0429   Sepsis Labs: Invalid input(s): "PROCALCITONIN", "LACTICIDVEN"   SIGNED:  [place] Hercules, MD  [facility] 09/14/2021, 5:39 PM

## 2021-09-16 ENCOUNTER — Encounter (HOSPITAL_BASED_OUTPATIENT_CLINIC_OR_DEPARTMENT_OTHER): Payer: BC Managed Care – PPO | Admitting: General Surgery

## 2021-09-16 DIAGNOSIS — E11621 Type 2 diabetes mellitus with foot ulcer: Secondary | ICD-10-CM | POA: Diagnosis not present

## 2021-09-16 NOTE — Progress Notes (Signed)
[name], [name] ([id number]) Visit Report for 09/16/2021 Chief Complaint Document Details Patient Name: Date of Service: [name] 09/16/2021 3:15 PM Medical Record Number: [id number] Patient Account Number: [id number] Date of Birth/Sex: Treating RN: 1985-05-25 (36 y.o. M) Primary Care Provider: [name] Other Clinician: Referring Provider: Treating Provider/Extender: [name] in Treatment: 99 Information Obtained from: Patient Chief Complaint 01/11/2019; patient is here for review of a rather substantial wound over the left fifth plantar metatarsal head extending into the lateral part of his foot 10/24/2019; patient returns to clinic with wounds on his bilateral feet with underlying osteomyelitis biopsy-proven Electronic Signature(s) Signed: 09/16/2021 4:28:21 PM By: [name] MD FACS Entered By: [name] on 09/16/2021 16:28:20 -------------------------------------------------------------------------------- Debridement Details Patient Name: Date of Service: [name], [name] 09/16/2021 3:15 PM Medical Record Number: [id number] Patient Account Number: [id number] Date of Birth/Sex: Treating RN: [birth year] (36 y.o. [name] Primary Care Provider: [name] Other Clinician: Referring Provider: Treating Provider/Extender: [name] in Treatment: 99 Debridement Performed for Assessment: Wound #3 Left,Lateral,Plantar Foot Performed By: Physician [name], MD Debridement Type: Debridement Severity of Tissue Pre Debridement: Fat layer exposed Level of Consciousness (Pre-procedure): Awake and Alert Pre-procedure Verification/Time Out Yes - 16:15 Taken: Start Time: 16:15 Pain Control: Other : Benzocaine 20% T Area Debrided (L x W): otal 3.5 (cm) x 3.7 (cm) = 12.95 (cm) Tissue and other material debrided: Non-Viable, Callus, Subcutaneous Level: Skin/Subcutaneous Tissue Debridement  Description: Excisional Instrument: Curette Bleeding: Minimum Hemostasis Achieved: Pressure End Time: 16:17 Procedural Pain: 0 Post Procedural Pain: 0 Response to Treatment: Procedure was tolerated well Level of Consciousness (Post- Awake and Alert procedure): Post Debridement Measurements of Total Wound Length: (cm) 3.5 Width: (cm) 3.7 Depth: (cm) 0.4 Volume: (cm) 4.068 Character of Wound/Ulcer Post Debridement: Improved Severity of Tissue Post Debridement: Fat layer exposed Post Procedure Diagnosis Same as Pre-procedure Electronic Signature(s) Signed: 09/16/2021 4:42:19 PM By: [name] MD FACS Signed: 09/16/2021 5:29:10 PM By: Dellie Catholic RN Entered By: Dellie Catholic on 09/16/2021 16:20:12 -------------------------------------------------------------------------------- Debridement Details Patient Name: Date of Service: [name], [name] 09/16/2021 3:15 PM Medical Record Number: [id number] Patient Account Number: [id number] Date of Birth/Sex: Treating RN: [birth year] (36 y.o. [name] Primary Care Provider: [name] Other Clinician: Referring Provider: Treating Provider/Extender: [name] in Treatment: 99 Debridement Performed for Assessment: Wound #8 Right,Lateral Foot Performed By: Physician [name], MD Debridement Type: Debridement Severity of Tissue Pre Debridement: Fat layer exposed Level of Consciousness (Pre-procedure): Awake and Alert Pre-procedure Verification/Time Out Yes - 16:15 Taken: Start Time: 16:15 Pain Control: Other : Benzocaine 20% T Area Debrided (L x W): otal 4 (cm) x 3.3 (cm) = 13.2 (cm) Tissue and other material debrided: Non-Viable, Callus, Subcutaneous Level: Skin/Subcutaneous Tissue Debridement Description: Excisional Instrument: Curette Bleeding: Moderate Hemostasis Achieved: Silver Nitrate End Time: 16:17 Procedural Pain: 0 Post Procedural Pain: 0 Response to  Treatment: Procedure was tolerated well Level of Consciousness (Post- Awake and Alert procedure): Post Debridement Measurements of Total Wound Length: (cm) 4 Width: (cm) 3.3 Depth: (cm) 1 Volume: (cm) 10.367 Character of Wound/Ulcer Post Debridement: Improved Severity of Tissue Post Debridement: Fat layer exposed Post Procedure Diagnosis Same as Pre-procedure Electronic Signature(s) Signed: 09/16/2021 4:42:19 PM By: [name] MD FACS Signed: 09/16/2021 5:29:10 PM By: Dellie Catholic RN Entered By: Dellie Catholic on 09/16/2021 16:24:48 -------------------------------------------------------------------------------- HPI Details Patient Name: Date of Service: [name], [name] 09/16/2021 3:15 PM Medical Record  Number: [id number] Patient Account Number: [id number] Date of Birth/Sex: Treating RN: [birth year] (36 y.o. 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 He comes in for review of a wound over the plantar fifth metatarsal head extending into the lateral part of the foot. He was followed for this previously by his podiatrist Dr. Cornelius Moras. As the patient tells his story he went to see podiatry first for a swelling he developed on the lateral part of his fifth metatarsal head in May. He states this was "open" by podiatry and the area closed. He was followed up in June and it was again opened callus removed and it closed promptly. There were plans being made for surgery on the fifth metatarsal head in June however his blood sugar was apparently too high for anesthesia. Apparently the area was debrided and opened again in June and it is never closed since. Looking over the records from podiatry I am really not able to  follow this. It was clear when he was first seen it was before 5/14 at that point he already had a wound. By 5/17 the ulcer was resolved. I do not see anything about a procedure. On 5/28 noted to have pre-ulcerative moderate keratosis. X-ray noted 1/5 contracted toe and tailor's bunion and metatarsal deformity. On a visit date on 09/28/2018 the dorsal part of the left foot it healed and resolved. There was concern about swelling in his lower extremity he was sent to the ER.. As far as I can tell he was seen in the ER on 7/12 with an ulcer on his left foot. A DVT rule out of the left leg was negative. I do not think I have complete records from podiatry but I am not able to verify the procedures this patient states he had. He states after the last procedure the wound has never closed although I am not able to follow this in the records I have from podiatry. He has not had a recent x-ray The patient has been using Neosporin on the wound. He is wearing a Darco shoe. He is still very active up on his foot working and exercising. Past medical history; type 2 diabetes ketosis-prone, leg swelling with a negative DVT study in July. Non-smoker ABI in our clinic was 0.85 on the left 10/16; substantial wound on the plantar left fifth met head extending laterally almost to the dorsal fifth MTP. We have been using silver alginate we gave him a Darco forefoot off loader. An x-ray did not show evidence of osteomyelitis did note soft tissue emphysema which I think was due to gas tracking through an open wound. There is no doubt in my mind he requires an MRI 10/23; MRI not booked until 3 November at the earliest this is largely due to his glucose sensor in the right arm. We have been using silver alginate. There has been an improvement 10/29; I am still not exactly sure when his MRI is booked for. He says it is the third but it is the 10th in epic. This definitely needs to be done. He is running a low-grade fever  today but no other symptoms. No real improvement in the 1 02/26/2019 patient presents today for a follow-up visit here in our clinic he is last been seen in the clinic on October  29. Subsequently we were working on getting MRI to evaluate and see what exactly was going on and where we would need to go from the standpoint of whether or not he had osteomyelitis and again what treatments were going be required. Subsequently the patient ended up being admitted to the hospital on 02/07/2019 and was discharged on 02/14/2019. This is a somewhat interesting admission with a discharge diagnosis of pneumonia due to COVID-19 although he was positive for COVID-19 when tested at the urgent care but negative x2 when he was actually in the hospital. With that being said he did have acute respiratory failure with hypoxia and it was noted he also have a left foot ulceration with osteomyelitis. With that being said he did require oxygen for his pneumonia and I level 4 L. He was placed on antivirals and steroids for the COVID-19. He was also transferred to the [facility] at one point. Nonetheless he did subsequently discharged home and since being home has done much better in that regard. The CT angiogram did not show any pulmonary embolism. With regard to the osteomyelitis the patient was placed on vancomycin and Zosyn while in the hospital but has been changed to Augmentin at discharge. It was also recommended that he follow- up with wound care and podiatry. Podiatry however wanted him to see [name] according to the patient prior to them doing anything further. His hemoglobin A1c was 9.9 as noted in the hospital. Have an MRI of the left foot performed while in the hospital on 02/04/2019. 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 There is an overlying plantar open wound noted an abscess tracking back along the lateral  aspect of the fifth metatarsal shaft. There is otherwise diffuse cellulitis and mild fasciitis without findings of polymyositis. The patient did have recently pneumonia secondary to COVID-19 I looked in the chart through epic and it does appear that the patient may need to have an additional x-ray just to ensure everything is cleared and that he has no airspace disease prior to putting him into the [name]. 03/05/2019; patient was readmitted to the clinic last week. He was hospitalized twice for a viral upper respiratory tract infection from 11/1 through 11/4 and then 11/5 through 11/12 ultimately this turned out to be Covid pneumonitis. Although he was discharged on oxygen he is not using it. He says he feels fine. He has no exercise limitation no cough no sputum. His O2 sat in our clinic today was 100% on room air. 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 He received Vanco and Zosyn in the hospital and then was discharged on 2 weeks of Augmentin. I do not see any relevant cultures. He was supposed to follow-up with infectious disease but I do not see that he has an appointment. 12/8; patient saw Dr. Novella Olive of infectious disease last week. He felt that he had had adequate antibiotic therapy. He did not go to follow-up with Dr. Amalia Hailey of podiatry and I have again talked to him about the pros and cons of this. He does not want to consider a ray amputation of this time. He is aware of the risks of recurrence, migration etc. He started HBO today and tolerated this well. He can complete the Augmentin that I gave him last week. I have looked over  the lab work that Dr. Chana Bode ordered his C-reactive protein was 3.3 and his sedimentation rate was 17. The C-reactive protein is never really been measurably that high in this patient 12/15; not much change in the wound today however he has  undermining along the lateral part of the foot again more extensively than last week. He has some rims of epithelialization. We have been using silver alginate. He is undergoing hyperbarics but did not dive today 12/18; in for his obligatory first total contact cast change. Unfortunately there was pus coming from the undermining area around his fifth metatarsal head. This was cultured but will preclude reapplication of a cast. He is seen in conjunction with HBO 12/24; patient had staph lugdunensis in the wound in the undermining area laterally last time. We put him on doxycycline which should have covered this. The wound looks better today. I am going to give him another week of doxycycline before reattempting the total contact cast 12/31; the patient is completing antibiotics. Hemorrhagic debris in the distal part of the wound with some undermining distally. He also had hyper granulation. Extensive debridement with a #5 curette. The infected area that was on the lateral part of the fifth met head is closed over. I do not think he needs any more antibiotics. Patient was seen prior to HBO. Preparations for a total contact cast were made in the cast will be placed post hyperbarics 04/11/19; once again the patient arrives today without complaint. He had been in a cast all week noted that he had heavy drainage this week. This resulted in large raised areas of macerated tissue around the wound 1/14; wound bed looks better slightly smaller. Hydrofera Blue has been changing himself. He had a heavy drainage last week which caused a lot of maceration around the wound so I took him out of a total contact cast he says the drainage is actually better this week He is seen today in conjunction with HBO 1/21; returns to clinic. He was up in [place] for a day or 2 attending a funeral. He comes back in with the wound larger and with a large area of exposed bone. He had osteomyelitis and septic arthritis of the fifth  left metatarsal head while he was in hospital. He received IV antibiotics in the hospital for a prolonged period of time then 3 weeks of Augmentin. Subsequently I gave him 2 weeks of doxycycline for more superficial wound infection. When I saw this last week the wound was smaller the surface of the wound looks satisfactory. 1/28; patient missed hyperbarics today. Bone biopsy I did last time showed Enterococcus faecalis and Staphylococcus lugdunensis . He has a wide area of exposed bone. We are going to use silver alginate as of today. I had another ethical discussion with the patient. 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 In this situation I think the likelihood of healing this is low. Therefore I have recommended a ray amputation and with the patient's agreement I have referred him to Dr. Doran Durand. The other issue is that his compliance with hyperbarics has been minimal because of his work schedule and given his underlying decision I am going to stop this today READMISSION 10/24/2019 MRI 09/29/2019 left foot IMPRESSION: 1. Apparent skin ulceration inferior and lateral to the 5th metatarsal base with underlying heterogeneous T2 signal and enhancement in the subcutaneous fat. Small peripherally enhancing fluid collections along the plantar and lateral aspects of the 5th metatarsal base suspicious for abscesses. 2.  Interval amputation through the mid 5th metatarsal with nonspecific low-level marrow edema and enhancement. Given the proximity to the adjacent soft tissue inflammatory changes, osteomyelitis cannot be excluded. 3. The additional bones appear unremarkable. MRI 09/29/2019 right foot IMPRESSION: 1. Soft tissue ulceration lateral to the 5th MTP joint. There is low-level T2 hyperintensity within the 4th and 5th metatarsal heads and adjacent proximal phalanges without abnormal T1 signal or cortical destruction. These findings are nonspecific and could  be seen with early marrow edema, hyperemia or early osteomyelitis. No evidence of septic joint. 2. Mild tenosynovitis and synovial enhancement associated with the extensor digitorum tendons at the level of the midfoot. 3. Diffuse low-level muscular T2 hyperintensity and enhancement, most consistent with diabetic myopathy. LEFT FOOT BONE Methicillin resistant staphylococcus aureus Staphylococcus lugdunensis MIC MIC CIPROFLOXACIN >=8 RESISTANT Resistant <=0.5 SENSI... Sensitive CLINDAMYCIN <=0.25 SENS... Sensitive >=8 RESISTANT Resistant ERYTHROMYCIN >=8 RESISTANT Resistant >=8 RESISTANT Resistant GENTAMICIN <=0.5 SENSI... Sensitive <=0.5 SENSI... Sensitive Inducible Clindamycin NEGATIVE Sensitive NEGATIVE Sensitive OXACILLIN >=4 RESISTANT Resistant 2 SENSITIVE Sensitive RIFAMPIN <=0.5 SENSI... Sensitive <=0.5 SENSI... Sensitive TETRACYCLINE <=1 SENSITIVE Sensitive <=1 SENSITIVE Sensitive TRIMETH/SULFA <=10 SENSIT Sensitive <=10 SENSIT Sensitive ... Marland Kitchen.. VANCOMYCIN 1 SENSITIVE Sensitive <=0.5 SENSI... Sensitive Right foot bone . Component 3 wk ago Specimen Description BONE Special Requests RIGHT 4 METATARSAL SAMPLE B Gram Stain NO WBC SEEN NO ORGANISMS SEEN Culture RARE METHICILLIN RESISTANT STAPHYLOCOCCUS AUREUS NO ANAEROBES ISOLATED Performed at [facility], [place] [address], [place], [place] [address] Report Status 10/08/2019 FINAL Organism ID, Bacteria METHICILLIN RESISTANT STAPHYLOCOCCUS AUREUS Resulting Agency [facility] Susceptibility Methicillin resistant staphylococcus aureus MIC CIPROFLOXACIN >=8 RESISTANT Resistant CLINDAMYCIN <=0.25 SENS... Sensitive ERYTHROMYCIN >=8 RESISTANT Resistant GENTAMICIN <=0.5 SENSI... Sensitive Inducible Clindamycin NEGATIVE Sensitive OXACILLIN >=4 RESISTANT Resistant RIFAMPIN <=0.5 SENSI... Sensitive TETRACYCLINE <=1 SENSITIVE Sensitive TRIMETH/SULFA <=10 SENSIT Sensitive ... VANCOMYCIN 1 SENSITIVE Sensitive This is a patient  we had in clinic earlier this year with a wound over his left fifth metatarsal head. 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 Doran Durand on 05/16/2019. He seems to have developed problems with wounds on his bilateral feet in June 2021 although he may have had problems earlier than this. He was in an urgent care with a right foot ulcer on 09/26/2019 and given a course of doxycycline. This was apparently after having trouble getting into see orthopedics. He was seen by podiatry on 09/28/2019 noted to have bilateral lower extremity ulcers including the left lateral fifth metatarsal base and the right subfifth met head. It was noted that had purulent drainage at that time. He required hospitalization from 6/20 through 7/2. This was because of worsening right foot wounds. He underwent bilateral operative incision and drainage and bone biopsies bilaterally. Culture results are listed above. He has been referred back to clinic by Dr. Jacqualyn Posey of podiatry. He is also followed by Dr. Megan Salon who saw him yesterday. He was discharged from hospital on Zyvox Flagyl and Levaquin and yesterday changed to doxycycline Flagyl and Levaquin. His inflammatory markers on 6/26 showed a sedimentation rate of 129 and a C-reactive protein of 5. This is improved to 14 and 1.3 respectively. This would indicate improvement. ABIs in our clinic today were 1.23 on the right and 1.20 on the left 11/01/2019  on evaluation today patient appears to be doing fairly well in regard to the wounds on his feet at this point. Fortunately there is no signs of active infection at this time. No fevers, chills, nausea, vomiting, or diarrhea. He currently is seeing infectious  disease and still under their care at this point. Subsequently he also has both wounds which she has not been using collagen on as he did not receive that in his packaging he did not call us and let us know that. Apparently that just was missed on the order. Nonetheless we will get that straightened out today. 8/9-Patient returns for bilateral foot wounds, using Prisma with hydrogel moistened dressings, and the wounds appear stable. Patient using surgical shoes, avoiding much pressure or weightbearing as much as possible 8/16; patient has bilateral foot wounds. 1 on the right lateral foot proximally the other is on the left mid lateral foot. Both required debridement of callus and thick skin around the wounds. We have been using silver collagen 8/27; patient has bilateral lateral foot wounds. The area on the left substantially surrounded by callus and dry skin. This was removed from the wound edge. The underlying wound is small. The area on the right measured somewhat smaller today. 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 Unfortunately I did not update his antibiotics during today's visit. 9/10 I reviewed Dr. Hale Bogus last notes he felt he had completed antibiotics his inflammatory markers were reasonably well controlled. He has a small wound on the lateral left foot and a tiny area on the right which is just above closed. He is using Hydrofera Blue with border foam he has bilateral surgical shoes 9/24; 2 week f/u. doing well. right foot is closed. left foot still undermined. 10/14; right foot remains closed at the fifth met head. The area over the base of the left fifth metatarsal has a small open area but considerable undermining towards the plantar foot. Thick callus skin around this suggests an adequate pressure relief. We have talked about this. He says he is going to go back into his cam boot. I suggested a total contact  cast he did not seem enamored with this suggestion 10/26; left foot base of the fifth metatarsal. Same condition as last time. He has skin over the area with an open wound however the skin is not adherent. He went to see Dr. Earleen Newport who did an x-ray and culture of his foot I have not reviewed the x-ray but the patient was not told anything. He is on doxycycline 11/11; since the patient was last here he was in the emergency room on 10/30 he was concerned about swelling in the left foot. They did not do any cultures or x-rays. They changed his antibiotics to cephalexin. Previous culture showed group B strep. The cephalexin is appropriate as doxycycline has less than predictable coverage. Arrives in clinic today with swelling over this area under the wound. He also has a new wound on the right fifth metatarsal head 11/18; the patient has a difficult wound on the lateral aspect of the left fifth metatarsal head. The wound was almost ballotable last week I opened it slightly expecting to see purulence however there was just bleeding. I cultured this this was negative. X-ray unchanged. We are trying to get an MRI but I am not sure were going to be able to get this through his insurance. He also has an area on the right lateral fifth  metatarsal head this looks healthier 12/3; the patient finally got our MRI. Surprisingly this did not show osteomyelitis. I did show the soft tissue ulceration at the lateral plantar aspect of the fifth metatarsal base with a tiny residual 6 mm abscess overlying the superficial fascia I have tried to culture this area I have not been able to get this to grow anything. Nevertheless the protruding tissue looks aggravated. I suspect we should try to treat the underlying "abscess with broad-spectrum antibiotics. I am going to start him on Levaquin and Flagyl. He has much less edema in his legs and I am going to continue to wrap his legs and see him weekly 12/10. I started Levaquin and  Flagyl on him last week. He just picked up the Flagyl apparently there was some delay. The worry is the wound on the left fifth metatarsal base which is substantial and worsening. His foot looks like he inverts at the ankle making this a weightbearing surface. Certainly no improvement in fact I think the measurements of this are somewhat worse. We have been using 12/17; he apparently just got the Levaquin yesterday this is 2 weeks after the fact. He has completed the Flagyl. The area over the left fifth metatarsal base still has protruding granulation tissue although it does not look quite as bad as it did some weeks ago. He has severe bilateral lymphedema although we have not been treating him for wounds on his legs this is definitely going to require compression. There was so much edema in the left I did not wish to put him in a total contact cast today. I am going to increase his compression from 3-4 layer. The area on the right lateral fifth met head actually look quite good and superficial. 12/23; patient arrived with callus on the right fifth met head and the substantial hyper granulated callused wound on the base of his fifth metatarsal. He says he is completing his Levaquin in 2 days but I do not think that adds up with what I gave him but I will have to double check this. We are using Hydrofera Blue on both areas. My plan is to put the left leg in a cast the week after New Year's 04/06/2020; patient's wounds about the same. Right lateral fifth metatarsal head and left lateral foot over the base of the fifth metatarsal. There is undermining on the left lateral foot which I removed before application of total contact cast continuing with Hydrofera Blue new. Patient tells me he was seen by endocrinology today lab work was done [Dr. Kerr]. Also wondering whether he was referred to cardiology. I went over some lab work from previously does not have chronic renal failure certainly not nephrotic range  proteinuria he does have very poorly controlled diabetes but this is not his most updated lab work. Hemoglobin A1c has been over 11 1/10; the patient had a considerable amount of leakage towards mid part of his left foot with macerated skin however the wound surface looks better the area on the right lateral fifth met head is better as well. I am going to change the dressing on the left foot under the total contact cast to silver alginate, continue with Hydrofera Blue on the right. 1/20; patient was in the total contact cast for 10 days. Considerable amount of drainage although the skin around the wound does not look too bad on the left foot. The area on the right fifth metatarsal head is closed. Our nursing staff reports large  amount of drainage out of the left lateral foot wound 1/25; continues with copious amounts of drainage described by our intake staff. PCR culture I did last week showed E. coli and Enterococcus faecalis and low quantities. Multiple resistance genes documented including extended spectrum beta lactamase, MRSA, MRSE, quinolone, tetracycline. The wound is not quite as good this week as it was 5 days ago but about the same size 2/3; continues with copious amounts of malodorous drainage per our intake nurse. The PCR culture I did 2 weeks ago showed E. coli and low quantities of Enterococcus. There were multiple resistance genes detected. I put Neosporin on him last week although this does not seem to have helped. The wound is slightly deeper today. Offloading continues to be an issue here although with the amount of drainage she has a total contact cast is just not going to work 2/10; moderate amount of drainage. Patient reports he cannot get his stocking on over the dressing. I told him we have to do that the nurse gave him suggestions on how to make this work. The wound is on the bottom and lateral part of his left foot. Is cultured predominantly grew low amounts of Enterococcus,  E. coli and anaerobes. There were multiple resistance genes detected including extended spectrum beta lactamase, quinolone, tetracycline. I could not think of an easy oral combination to address this so for now I am going to do topical antibiotics provided by [facility] I think the main agents here are vancomycin and an aminoglycoside. We have to be able to give him access to the wounds to get the topical antibiotic on 2/17; moderate amount of drainage this is unchanged. He has his Keystone topical antibiotic against the deep tissue culture organisms. He has been using this and changing the dressing daily. Silver alginate on the wound surface. 2/24; using Keystone antibiotic with silver alginate on the top. He had too much drainage for a total contact cast at one point although I think that is improving and I think in the next week or 2 it might be possible to replace a total contact cast I did not do this today. In general the wound surface looks healthy however he continues to have thick rims of skin and subcutaneous tissue around the wide area of the circumference which I debrided 06/04/2020 upon evaluation today patient appears to be doing well in regard to his wound. I do feel like he is showing signs of improvement. There is little bit of callus and dead tissue around the edges of the wound as well as what appears to be a little bit of a sinus tract that is off to the side laterally I would perform debridement to clear that away today. 3/17; left lateral foot. The wound looks about the same as I remember. Not much depth surface looks healthy. No evidence of infection 3/25; left lateral foot. Wound surface looks about the same. Separating epithelium from the circumference. There really is no evidence of infection here however not making progress by my view 3/29; left lateral foot. Surface of the wound again looks reasonably healthy still thick skin and subcutaneous tissue around the wound margins.  There is no evidence of infection. One of the concerns being brought up by the nurses has again the amount of drainage vis--vis continued use of a total contact cast 4/5; left lateral foot at roughly the base of the fifth metatarsal. Nice healthy looking granulated tissue with rims of epithelialization. The overall wound measurements are not  any better but the tissue looks healthy. The only concern is the amount of drainage although he has no surrounding maceration with what we have been doing recently to absorb fluid and protect his skin. He also has lymphedema. 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 He states that the kerlix/coban under the TCC rolled down and could not get it back up. He has been using an offloading scooter and has somehow hurt his right foot using this device. This happened last week. He states that the side of his right foot developed a blister and opened. The top of his foot also has a few small open wounds he thinks is due to his socks rubbing in his shoes. He has not been using any dressings to the wound. He denies purulent drainage, fever/chills or erythema to the wounds. 4/22; patient presents for 1 week follow-up. He developed new wounds to the right foot that were evaluated at last clinic visit. He continues to have a total contact cast to the left leg and he reports no issues. He has been using silver collagen to the right foot wounds with no issues. He denies purulent drainage, fever/chills or erythema to the right foot wounds. He has no complaints today 4/25; patient presents for 1 week follow-up. He has a total contact cast of the left leg and reports no issues. He has been using silver alginate to the right foot wound.  He denies purulent drainage, fever/chills or erythema to the right foot wounds. 5/2 patient presents for 1 week follow-up. T contact cast on the left. The wound which is on the base of the plantar foot at the base of the fifth metatarsal otal actually looks quite good and dimensions continue to gradually contract. HOWEVER the area on the right lateral fifth metatarsal head is much larger than what I remember from 2 weeks ago. Once more is he has significant levels of hypergranulation. Noteworthy that he had this same hyper granulated response on his wound on the left foot at one point in time. So much so that he I thought there was an underlying fluid collection. Based on this I think this just needs debridement. 5/9; the wound on the left actually continues to be gradually smaller with a healthy surface. Slight amount of drainage and maceration of the skin around but not too bad. However he has a large wound over the right fifth metatarsal head very much in the same configuration as his left foot wound was initially. I used silver nitrate to address the hyper granulated tissue no mechanical debridement 5/16; area on the left foot did not look as healthy this week deeper thick surrounding macerated skin and subcutaneous tissue. The area on the right foot fifth met head was about the same The area on the right ankle that we identified last week is completely broken down into an open wound presumably a stocking rubbing issue 5/23; patient has been using a total contact cast to the left side. He has been using silver alginate underneath. He has also been using silver alginate to the right foot wounds. He has no complaints today. He denies any signs of infection. 5/31; the left-sided wound looks some better measure smaller surface granulation looks better. We  have been using silver alginate under the total contact cast The large area on his right fifth met head and right dorsal foot look about the same  still using silver alginate 6/6; neither side is good as I was hoping although the surface area dimensions are better. A lot of maceration on his left and right foot around the wound edge. Area on the dorsal right foot looks better. He says he was traveling. I am not sure what does the amount of maceration around the plantar wounds may be drainage issues 6/13; in general the wound surfaces look quite good on both sides. Macerated skin and raised edges around the wound required debridement although in general especially on the left the surface area seems improved. The area on the right dorsal ankle is about the same I thought this would not be such a problem to close 6/20; not much change in either wound although the one on the right looks a little better. Both wounds have thick macerated edges to the skin requiring debridements. We have been using silver alginate. The area on the dorsal right ankle is still open I thought this would be closed. 6/28; patient comes in today with a marked deterioration in the right foot wound fifth met head. Wide area of exposed bone this is a drastic change from last time. The area on the left there we have been casting is stagnant. We have been using silver alginate in both wound areas. 7/5; bone culture I did for PCR last time was positive for Pseudomonas, group B strep, Enterococcus and Staph aureus. There was no suggestion of methicillin resistance or ampicillin resistant genes. This was resistant to tetracycline however He comes into the clinic today with the area over his right plantar fifth metatarsal head which had been doing so well 2 weeks ago completely necrotic feeling bone. I do not know that this is going to be salvageable. The left foot wound is certainly no smaller but it has a better surface and is superficial. 7/8; patient called in this morning to say that his total contact cast was rubbing against his foot. He states he is doing fine overall. He  denies signs of infection. 7/12; continued deterioration in the wound over the right fifth metatarsal head crumbling bone. This is not going to be salvageable. The patient agrees and wants to be referred to Dr. Doran Durand which we will attempt to arrange as soon as possible. I am going to continue him on antibiotics as long as that takes so I will renew those today. The area on the left foot which is the base of the fifth metatarsal continues to look somewhat better. Healthy looking tissue no depth no debridement is necessary here. 7/20; the patient was kindly seen by Dr. Doran Durand of orthopedics on 10/19/2020. 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 Towards this end we have taken him out of the total contact cast on the left we will put him in a wrap with Hydrofera Blue. As I understand things surgery is planned for 7/21 7/27; patient had his surgery last Thursday. 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 Apparently everything went well we did not still disturb that today The area on the left foot actually looks quite good. He has been much less mobile which probably explains this he did not seem to do well in the total contact cast secondary  to drainage and maceration I think. 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 Fortunately there is no evidence of active infection at this time. No fevers, chills, nausea, vomiting, or diarrhea. Overall I think that he is actually doing extremely well. Nonetheless I do believe that he is staying off of this more following the surgery in his right foot that is the reason the left is doing so great. 8/16; left plantar foot wound. This looks smaller than the last time I saw this he is using Hydrofera Blue. The surgical wound on the right foot is being followed by  Dr. Doran Durand we did not look at this today. He has surgical shoes on both feet 8/23; left plantar foot wound not as good this week. Surrounding macerated skin and subcutaneous tissue everything looks moist and wet. I do not think he is offloading this adequately. He is using a surgical shoe Apparently the right foot surgical wound is not open although I did not check his foot 8/31; left plantar foot lateral aspect. Much improved this week. He has no maceration. Some improvement in the surface area of the wound but most impressively the depth is come in we are using silver alginate. The patient is a Product/process development scientist. He is asked that we write him a letter so he can go back to work. I have also tried to see if we can write something that will allow him to limit the amount of time that he is on his foot at work. 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 Hopefully the latter can be avoided 9/6; I believe the patient missed an appointment last week. He arrives in today with a wound looking roughly the same certainly no better. Undermining laterally and also inferiorly. We used molecuLight today in training with the patient's permission.. We are using silver alginate 9/21 wound is measuring bigger this week although this may have to do with the aggressive circumferential debridement last week in response to the blush fluorescence on the MolecuLight. Culture I did last week showed significant MSSA and E. coli. I put him on Augmentin but he has not started it yet. We are also going to send this for compounded antibiotics at [facility] 2. There is no evidence of systemic infection 9/29; silver alginate. His Keystone arrived. He is completing Augmentin in 2 days. Offloading in a cam boot. Moderate drainage per our intake staff 10/5; using silver alginate. He has been using his [place]. He has completed his Augmentin. Per our  intake nurse still a lot of drainage, far too much to consider a total contact cast. Wound measures about the same. He had the same undermining area that I defined last week from a roughly 11-3. I remove this today 10/12; using silver alginate he is using the [place]. He comes in for a nurse visit hence we are applying [employer] twice a week. Measuring slightly better today and less notable drainage. Extensive debridement of the wound edge last time 10/18; using topical Keystone and silver alginate and a soft cast. Wound measurements about the same. Drainage was through his soft cast. We are changing this twice a week Tuesdays and Friday 10/25; comes in with moderate drainage. Still using Keystone silver alginate and a soft cast. Wound dimensions completely the same.He has a lot of  edema in the left leg he has lymphedema. Asking for [name] to consider wrapping him as he cannot get his stocking on over the soft cast 11/2; comes in with moderate to large drainage slightly smaller in terms of width we have been using [place]. His wound looks satisfactory but not much improvement 11/4; patient presents today for obligatory cast change. Has no issues or complaints today. He denies signs of infection. 11/9; patient traveled this weekend to [place], was on the cast quite a bit. Staining of the cast with black material from his walking boot. Drainage was not quite as bad as we feared. Using silver alginate and Keystone 11/16; we do not have size for cast therefore we have been putting a soft cast on him since the change on Friday. Still a significant amount of drainage necessitating changing twice a week. We have been using the Keystone at cast changes either hard or soft as well as silver alginate Comes in the clinic with things actually looking fairly good improvement in width. He says his offloading is about the same 02/24/2021 upon evaluation today patient actually comes back in and is doing excellent in regard  to his foot ulcer this is significantly smaller even compared to the last visit. The soft cast seems to have done extremely well for him which is great news. I do not see any signs of infection minimal debridement will be needed today. 11/30; left lateral foot much improved half a centimeter improvement in surface area. No evidence of infection. He seems to be doing better with the soft cast in the TCC therefore we will continue with this. He comes back in later in the week for a change with the nurses. This is due to drainage 12/6; no improvement in dimensions. Under illumination some debris on the surface we have been using silver alginate, soft cast. If there is anything optimistic here he seems to have have less drainage 12/13. Dimensions are improved both length and width and slightly in depth. Appears to be quite healthy today. Raised edges of this thick skin and callus around the edges however. He is in a soft cast were bringing him back once for a change on Friday. Drainage is better 12/20. Dimensions are improved. He still has raised edges of thick skin and callus around the edges. We are using a soft cast 12/28; comes in today with thick callus around the wound. Using silver under alginate under a soft cast. I do not think there is much improvement in any measurement 2023 04/06/2021; patient was put in a total contact cast. Unfortunately not much change in surface area 1/10; not much different still thick callus and skin around the edge in spite of the total contact cast. This was just debrided last week we have been using the [facility] compounded antibiotic and silver alginate under a total contact cast 1/18 the patient's wound on the left side is doing nicely. smaller HOWEVER he comes in today with a wound on the right foot laterally. blister most likely serosangquenous drainage 1/24; the patient continues to do well in terms of the plantar left foot which is continued to contract using  silver alginate under the total contact cast HOWEVER the right lateral foot is bigger with denuded skin around the edges. I used pickups and a #15 scalpel to remove this this looks like the remanence of a large blister. Cannot rule out infection. Culture in this area I did last week showed Staphylococcus lugdunensis few colonies. I am going  to try to address this with his [employer] antibiotic that is done so well on the left having linezolid and this should cover the staph 2/1; the patient's wound on his left foot which was the original plantar foot wound thick skin and eschar around the edges even in the total contact cast but the wound surface does not look too bad The real problem is on how his right lateral foot at roughly the base of the fifth metatarsal. The wound is completely necrotic more worrisome than that there is swelling around the edges of this. We have been using silver alginate on both wounds and Keystone on the right foot. Unfortunately I think he is going to require systemic antibiotics while we await cultures. He did not get the x-ray done that we ordered last week [lost the prescription 2/7; disappointingly in the area on the left foot which we are treating with a total contact cast is still not closed although it is much smaller. He continues to have a lot of callus around the wound edge. -Right lateral foot culture I did last week was negative x-ray also negative for osteomyelitis. 2/15: TCC silver alginate on the left and silver alginate on the right lateral. No real improvement in either area 05/26/2021: T oday, the wounds are roughly the same size as at his previous visit, post-debridement. He continues to endorse fairly substantial drainage, particularly on the right. He has been in a total contact cast on the left. There is still some callus surrounding this lesion. On the right, the periwound skin is quite macerated, along with surrounding callus. The center of the  right-sided wound also has some dark, densely adherent material, which is very difficult to remove. 06/02/2021: Today, both wounds are slightly smaller. He has been using zinc oxide ointment around the right ulcer and the degree of maceration has improved markedly. There continues to be an area of nonviable tissue in the center of the right sided ulcer. The left-sided wound, which has been in the total contact cast. Appears clean and the degree of callus around it is less than previously. 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 He had to take the stairs and both wounds have increased in size. The left foot, which has been in a total contact cast, has developed a tunnel tracking to the lateral aspect of his foot. The nonviable tissue in the center of the right-sided ulcer remains recalcitrant to debridement. There is significant undermining surrounding the entirety of the left sided wound. 06/16/2021: The elevator at school has been fixed and the patient has been able to avoid putting as much weight on his wounds over the past week. We converted the left foot wound into a single lesion today, but despite this, the wound is actually smaller. The base is healthy with limited periwound callus. On the right, the central necrotic area is still present. He continues to be quite macerated around the right sided wound, despite applying barrier cream. This does, however, have the benefit of softening the callus to make it more easily removable. 06/23/2021: Today, the left wound is smaller. The lateral aspect that had opened up previously is now closed. The wound base has a healthy bed of granulation tissue and minimal slough. Unfortunately, on the right, the wound is larger and continues to be fairly macerated. He has also reopened the wound at his right ankle. He thinks this is due to the gait he has  adopted secondary to his total contact cast and  boot. 06/30/2021: T oday, both wounds are a little bit larger. The lateral aspect on the left has remained closed. He continues to have significant periwound maceration. The culture that I took from the right sided wound grew a population of bacteria that is not covered by his current [facility] antibiotic. The center of the right- sided wound continues to appear necrotic with nonviable fat. It probes deeper today, but does not reach bone. 07/07/2021: The periwound maceration is a little bit less today. The right lateral foot wound has some areas that appear more viable and the necrotic center also looks a little bit better. The wound on the dorsal surface of his right foot near the ankle is contracting and the surface appears healthy. The left plantar wound surface looks healthy, but there is some new undermining on the medial portion. He did get his new Keystone antibiotic and began applying that to the right foot wound on Saturday. 07/14/2021: The intake nurse reported substantial drainage from his wounds, but the periwound skin actually looks better than is typical for him. The wound on the dorsal surface of his right foot near the ankle is smaller and just has a small open area underneath some dried eschar. The left plantar wound surface looks healthy and there has been no significant accumulation of callus. The right lateral foot wound looks quite a bit better, with the central portion, which typically appears necrotic, looking more viable albeit pale. 07/22/2021: His left foot is extremely macerated today. The wound is about the same size. The wound on the dorsal surface of his right foot near the ankle had closed, but he traumatized it removing the dressing and there is a tiny skin tear in that location. The right lateral foot wound is bigger, but the surface appears healthy. 07/30/2021: The wound on the dorsal surface of his right foot near the ankle is closed. The right lateral foot wound again is a  little bit bigger due to some undermining. The periwound skin is in better condition, however. He has been applying zinc oxide. The wound surface is a little bit dry today. On the left, he does not have the substantial maceration that we frequently see. The wound itself is smaller and has a clean surface. 08/06/2021: Both wounds seem to have deteriorated over the past week. The right lateral foot wound has a dry surface but the periwound is boggy.. Overall wound dimensions are about the same. On the left, the wound is about the same size, but there is more undermining present underneath periwound callus. 08/13/2021: The right sided wound looks about the same, but on the left there has been substantial deterioration. The undermining continues to extend under periwound callus. Once this was removed, substantial extension of the wound was present. There is no odor or purulent drainage but clearly the wounds have broken down. 08/20/2021: The wounds look about the same today. He has been out of his total contact cast and has just been changing the dressings using topical Keystone with PolyMem Ag, Kerlix and Ace bandages. The wound on the top of his right ankle has reopened but this is quite small. There was a little bit of purulent material that I expressed when examining this wound. 08/24/2021: After the aggressive debridement I performed at his last visit, the wounds actually look a little bit better today. They are smaller with the exception of the wound on the top of his right ankle which is  a little bit bigger as some more skin pulled off when he was changing his dressing. We are using topical Keystone with PolyMem Ag Kerlix and Ace bandages. 09/02/2021: There has been really no change to any of his wounds. 09/16/2021: The patient was hospitalized last week with nausea, vomiting, and dehydration. He says that while he was in the hospital, his wounds were not really addressed properly. T oday, both plantar  foot wounds are larger and the periwound skin is macerated. The wound on the dorsum of his right foot has a scab on the top. The right foot now has a crater where previously he had had nonviable fat. It looks as though this simply died and fell out. The periwound callus is wet. Electronic Signature(s) Signed: 09/16/2021 4:32:43 PM By: [name] MD FACS Entered By: [name] on 09/16/2021 16:32:43 -------------------------------------------------------------------------------- Physical Exam Details Patient Name: Date of Service: [name], [name] 09/16/2021 3:15 PM Medical Record Number: [id number] Patient Account Number: [id number] Date of Birth/Sex: Treating RN: March 12, 1986 (36 y.o. M) Primary Care Provider: [name] Other Clinician: Referring Provider: Treating Provider/Extender: [name] in Treatment: 99 Constitutional Slightly hypotensive, asymptomatic.Marland Kitchen Slightly tachycardic, asymptomatic. . . No acute distress.Marland Kitchen Respiratory Normal work of breathing on room air.. Notes 09/16/2021: T oday, both plantar foot wounds are larger and the periwound skin is macerated. The wound on the dorsum of his right foot has a scab on the top. The right foot now has a crater where previously he had had nonviable fat. It looks as though this simply died and fell out. The periwound callus is wet. His right foot is red, swollen, and warm, as is the lower leg on that side. There is no purulent drainage or significant odor from his wounds. Electronic Signature(s) Signed: 09/16/2021 4:34:07 PM By: [name] MD FACS Entered By: [name] on 09/16/2021 16:34:07 -------------------------------------------------------------------------------- Physician Orders Details Patient Name: Date of Service: [name], [name] 09/16/2021 3:15 PM Medical Record Number: [id number] Patient Account Number: [id number] Date of Birth/Sex: Treating RN: 06-Jun-1985 (36 y.o.  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 - Dr. [name] - Room 3 Friday June 23rd at 3:15 pm Return A Bathing/ Shower/ Hygiene May shower with protection but do not get wound dressing(s) wet. - Use Cast Protector Bag on Right and left legs Edema Control - Lymphedema / SCD / Other Bilateral Lower Extremities Elevate legs to the level of the heart or above for 30 minutes daily and/or when sitting, a frequency of: - throughout the day Avoid standing for long periods of time. Exercise regularly Compression stocking or Garment 20-30 mm/Hg pressure to: - to right leg daily Off-Loading Open toe surgical shoe to: - right foot Other: - Soft cast to left foot Additional Orders / Instructions Follow Nutritious Diet Wound Treatment Wound #11 - Foot Wound Laterality: Right, Anterior Cleanser: Soap and Water 1 x Per Day/30 Days Discharge Instructions: May shower and wash wound with dial antibacterial soap and water prior to dressing change. Cleanser: Byram Ancillary  Kit - 15 Day Supply (Generic) 1 x Per Day/30 Days Discharge Instructions: Use supplies as instructed; Kit contains: (15) Saline Bullets; (15) 3x3 Gauze; 15 pr Gloves Topical: keystone antibiotic compound 1 x Per Day/30 Days Prim Dressing: KerraCel Ag Gelling Fiber Dressing, 4x5 in (silver alginate) 1 x Per Day/30 Days ary Discharge Instructions: Apply silver alginate to  wound bed as instructed Secondary Dressing: ABD Pad, 8x10 (Generic) 1 x Per Day/30 Days Discharge Instructions: Apply over primary dressing as directed. Secondary Dressing: Optifoam Non-Adhesive Dressing, 4x4 in (Generic) 1 x Per Day/30 Days Discharge Instructions: Apply over primary dressing as directed. Secondary Dressing: Zetuvit Plus 4x8 in (Generic) 1 x Per Day/30 Days Discharge Instructions: Apply over primary dressing as directed. Secondary Dressing: NonWoven Sponge, 4x4 (in/in) (Generic) 1 x Per Day/30 Days Secured With: Coban Self-Adherent Wrap 4x5 (in/yd) (Generic) 1 x Per Day/30 Days Discharge Instructions: Secure with Coban as directed. Secured With: [employer], 4.5x3.1 (in/yd) (Generic) 1 x Per Day/30 Days Discharge Instructions: Secure with Kerlix as directed. Secured With: 611M Medipore H Soft Cloth Surgical T ape, 4 x 10 (in/yd) (Generic) 1 x Per Day/30 Days Discharge Instructions: Secure with tape as directed. Wound #3 - Foot Wound Laterality: Plantar, Left, Lateral Cleanser: Soap and Water 1 x Per Day/30 Days Discharge Instructions: May shower and wash wound with dial antibacterial soap and water prior to dressing change. Cleanser: Byram Ancillary Kit - 15 Day Supply (Generic) 1 x Per Day/30 Days Discharge Instructions: Use supplies as instructed; Kit contains: (15) Saline Bullets; (15) 3x3 Gauze; 15 pr Gloves Topical: keystone antibiotic compound 1 x Per Day/30 Days Prim Dressing: KerraCel Ag Gelling Fiber Dressing, 4x5 in (silver alginate) 1 x Per Day/30 Days ary Discharge Instructions: Apply silver alginate to wound bed as instructed Secondary Dressing: ABD Pad, 8x10 (Generic) 1 x Per Day/30 Days Discharge Instructions: Apply over primary dressing as directed. Secondary Dressing: Optifoam Non-Adhesive Dressing, 4x4 in (Generic) 1 x Per Day/30 Days Discharge Instructions: Apply over primary dressing as directed. Secondary Dressing: Zetuvit Plus 4x8 in (Generic) 1  x Per Day/30 Days Discharge Instructions: Apply over primary dressing as directed. Secondary Dressing: NonWoven Sponge, 4x4 (in/in) (Generic) 1 x Per Day/30 Days Secured With: Coban Self-Adherent Wrap 4x5 (in/yd) (Generic) 1 x Per Day/30 Days Discharge Instructions: Secure with Coban as directed. Secured With: [employer], 4.5x3.1 (in/yd) (Generic) 1 x Per Day/30 Days Discharge Instructions: Secure with Kerlix as directed. Secured With: 611M Medipore H Soft Cloth Surgical T ape, 4 x 10 (in/yd) (Generic) 1 x Per Day/30 Days Discharge Instructions: Secure with tape as directed. Wound #8 - Foot Wound Laterality: Right, Lateral Cleanser: Soap and Water 1 x Per Day/30 Days Discharge Instructions: May shower and wash wound with dial antibacterial soap and water prior to dressing change. Cleanser: Byram Ancillary Kit - 15 Day Supply (Generic) 1 x Per Day/30 Days Discharge Instructions: Use supplies as instructed; Kit contains: (15) Saline Bullets; (15) 3x3 Gauze; 15 pr Gloves Topical: keystone antibiotic compound 1 x Per Day/30 Days Prim Dressing: KerraCel Ag Gelling Fiber Dressing, 4x5 in (silver alginate) 1 x Per Day/30 Days ary Discharge Instructions: Apply silver alginate to wound bed as instructed Secondary Dressing: ABD Pad, 8x10 (Generic) 1 x Per Day/30 Days Discharge Instructions: Apply over primary dressing as directed. Secondary Dressing: Optifoam Non-Adhesive Dressing, 4x4 in (Generic) 1 x Per Day/30 Days Discharge Instructions: Apply over primary dressing as directed. Secondary Dressing: Zetuvit Plus 4x8 in (Generic) 1 x Per Day/30 Days Discharge  Instructions: Apply over primary dressing as directed. Secondary Dressing: NonWoven Sponge, 4x4 (in/in) (Generic) 1 x Per Day/30 Days Secured With: Coban Self-Adherent Wrap 4x5 (in/yd) (Generic) 1 x Per Day/30 Days Discharge Instructions: Secure with Coban as directed. Secured With: [employer], 4.5x3.1 (in/yd) (Generic) 1 x Per  Day/30 Days Discharge Instructions: Secure with Kerlix as directed. Secured With: 82M Medipore H Soft Cloth Surgical T ape, 4 x 10 (in/yd) (Generic) 1 x Per Day/30 Days Discharge Instructions: Secure with tape as directed. Patient Medications llergies: metformin A Notifications Medication Indication Start End 09/16/2021 Bactrim DS DOSE oral 800 mg-160 mg tablet - 1 tab PO BID x 14 days 09/16/2021 Cipro DOSE oral 500 mg tablet - 1 tab PO BID x 14 days Electronic Signature(s) Signed: 09/16/2021 4:42:19 PM By: [name] MD FACS Previous Signature: 09/16/2021 4:38:51 PM Version By: [name] MD FACS Entered By: [name] on 09/16/2021 16:40:04 -------------------------------------------------------------------------------- Problem List Details Patient Name: Date of Service: [name], [name] 09/16/2021 3:15 PM Medical Record Number: [id number] Patient Account Number: [id number] Date of Birth/Sex: Treating RN: 1986/01/05 (36 y.o. 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 08/10/2020 No Yes Inactive Problems ICD-10 Code Description Active Date Inactive Date L97.518 Non-pressure chronic ulcer of other part of right foot with other specified severity 07/14/2020 07/14/2020 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, right ankle and foot 10/24/2019 10/24/2019 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, left ankle and foot  10/24/2019 10/24/2019 B95.62 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 10/24/2019 10/24/2019 classified elsewhere Resolved Problems Electronic Signature(s) Signed: 09/16/2021 4:27:59 PM By: [name] MD FACS Entered By: [name] on 09/16/2021 16:27:59 -------------------------------------------------------------------------------- Progress Note Details Patient Name: Date of Service: [name], [name] 09/16/2021 3:15 PM Medical Record Number: [id number] Patient Account Number: [id number] Date of Birth/Sex: Treating RN: [birth year] (36 y.o. 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 He comes in for review of a wound over the plantar fifth metatarsal head extending into the lateral part of the foot. He was followed for this previously by his podiatrist Dr. Cornelius Moras. As the patient tells his story he went to see podiatry first for a swelling he developed on the lateral part of his fifth metatarsal head in May. He states this was "open" by podiatry and the area closed. He was followed up in June and it was again opened callus removed and it closed promptly. There were plans being made for surgery on the fifth metatarsal head in June however his blood sugar was apparently too high for anesthesia. Apparently the  area was debrided and opened again in June and it is never  closed since. Looking over the records from podiatry I am really not able to follow this. It was clear when he was first seen it was before 5/14 at that point he already had a wound. By 5/17 the ulcer was resolved. I do not see anything about a procedure. On 5/28 noted to have pre-ulcerative moderate keratosis. X-ray noted 1/5 contracted toe and tailor's bunion and metatarsal deformity. On a visit date on 09/28/2018 the dorsal part of the left foot it healed and resolved. There was concern about swelling in his lower extremity he was sent to the ER.. As far as I can tell he was seen in the ER on 7/12 with an ulcer on his left foot. A DVT rule out of the left leg was negative. I do not think I have complete records from podiatry but I am not able to verify the procedures this patient states he had. He states after the last procedure the wound has never closed although I am not able to follow this in the records I have from podiatry. He has not had a recent x-ray The patient has been using Neosporin on the wound. He is wearing a Darco shoe. He is still very active up on his foot working and exercising. Past medical history; type 2 diabetes ketosis-prone, leg swelling with a negative DVT study in July. Non-smoker ABI in our clinic was 0.85 on the left 10/16; substantial wound on the plantar left fifth met head extending laterally almost to the dorsal fifth MTP. We have been using silver alginate we gave him a Darco forefoot off loader. An x-ray did not show evidence of osteomyelitis did note soft tissue emphysema which I think was due to gas tracking through an open wound. There is no doubt in my mind he requires an MRI 10/23; MRI not booked until 3 November at the earliest this is largely due to his glucose sensor in the right arm. We have been using silver alginate. There has been an improvement 10/29; I am still not exactly sure when his MRI is booked for. He says it is the third but it is the 10th in  epic. This definitely needs to be done. He is running a low-grade fever today but no other symptoms. No real improvement in the 1 02/26/2019 patient presents today for a follow-up visit here in our clinic he is last been seen in the clinic on October 29. Subsequently we were working on getting MRI to evaluate and see what exactly was going on and where we would need to go from the standpoint of whether or not he had osteomyelitis and again what treatments were going be required. Subsequently the patient ended up being admitted to the hospital on 02/07/2019 and was discharged on 02/14/2019. This is a somewhat interesting admission with a discharge diagnosis of pneumonia due to COVID-19 although he was positive for COVID-19 when tested at the urgent care but negative x2 when he was actually in the hospital. With that being said he did have acute respiratory failure with hypoxia and it was noted he also have a left foot ulceration with osteomyelitis. With that being said he did require oxygen for his pneumonia and I level 4 L. He was placed on antivirals and steroids for the COVID-19. He was also transferred to the [facility] at one point. Nonetheless he did subsequently discharged home and since being home has done much  better in that regard. The CT angiogram did not show any pulmonary embolism. With regard to the osteomyelitis the patient was placed on vancomycin and Zosyn while in the hospital but has been changed to Augmentin at discharge. It was also recommended that he follow- up with wound care and podiatry. Podiatry however wanted him to see [name] according to the patient prior to them doing anything further. His hemoglobin A1c was 9.9 as noted in the hospital. Have an MRI of the left foot performed while in the hospital on 02/04/2019. 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 There is an overlying  plantar open wound noted an abscess tracking back along the lateral aspect of the fifth metatarsal shaft. There is otherwise diffuse cellulitis and mild fasciitis without findings of polymyositis. The patient did have recently pneumonia secondary to COVID-19 I looked in the chart through epic and it does appear that the patient may need to have an additional x-ray just to ensure everything is cleared and that he has no airspace disease prior to putting him into the [name]. 03/05/2019; patient was readmitted to the clinic last week. He was hospitalized twice for a viral upper respiratory tract infection from 11/1 through 11/4 and then 11/5 through 11/12 ultimately this turned out to be Covid pneumonitis. Although he was discharged on oxygen he is not using it. He says he feels fine. He has no exercise limitation no cough no sputum. His O2 sat in our clinic today was 100% on room air. 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 He received Vanco and Zosyn in the hospital and then was discharged on 2 weeks of Augmentin. I do not see any relevant cultures. He was supposed to follow-up with infectious disease but I do not see that he has an appointment. 12/8; patient saw Dr. Novella Olive of infectious disease last week. He felt that he had had adequate antibiotic therapy. He did not go to follow-up with Dr. Amalia Hailey of podiatry and I have again talked to him about the pros and cons of this. He does not want to consider a ray amputation of this time. He is aware of the risks of recurrence, migration etc. He started HBO today and tolerated this well. He can complete the Augmentin that I gave him last week. I have looked over the lab work that Dr. Chana Bode ordered his C-reactive protein was 3.3 and his sedimentation rate was 17. The C-reactive protein is never really been measurably that high in this  patient 12/15; not much change in the wound today however he has undermining along the lateral part of the foot again more extensively than last week. He has some rims of epithelialization. We have been using silver alginate. He is undergoing hyperbarics but did not dive today 12/18; in for his obligatory first total contact cast change. Unfortunately there was pus coming from the undermining area around his fifth metatarsal head. This was cultured but will preclude reapplication of a cast. He is seen in conjunction with HBO 12/24; patient had staph lugdunensis in the wound in the undermining area laterally last time. We put him on doxycycline which should have covered this. The wound looks better today. I am going to give him another week of doxycycline before reattempting the total contact cast 12/31; the patient is completing antibiotics. Hemorrhagic  debris in the distal part of the wound with some undermining distally. He also had hyper granulation. Extensive debridement with a #5 curette. The infected area that was on the lateral part of the fifth met head is closed over. I do not think he needs any more antibiotics. Patient was seen prior to HBO. Preparations for a total contact cast were made in the cast will be placed post hyperbarics 04/11/19; once again the patient arrives today without complaint. He had been in a cast all week noted that he had heavy drainage this week. This resulted in large raised areas of macerated tissue around the wound 1/14; wound bed looks better slightly smaller. Hydrofera Blue has been changing himself. He had a heavy drainage last week which caused a lot of maceration around the wound so I took him out of a total contact cast he says the drainage is actually better this week He is seen today in conjunction with HBO 1/21; returns to clinic. He was up in [place] for a day or 2 attending a funeral. He comes back in with the wound larger and with a large area of  exposed bone. He had osteomyelitis and septic arthritis of the fifth left metatarsal head while he was in hospital. He received IV antibiotics in the hospital for a prolonged period of time then 3 weeks of Augmentin. Subsequently I gave him 2 weeks of doxycycline for more superficial wound infection. When I saw this last week the wound was smaller the surface of the wound looks satisfactory. 1/28; patient missed hyperbarics today. Bone biopsy I did last time showed Enterococcus faecalis and Staphylococcus lugdunensis . He has a wide area of exposed bone. We are going to use silver alginate as of today. I had another ethical discussion with the patient. 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 In this situation I think the likelihood of healing this is low. Therefore I have recommended a ray amputation and with the patient's agreement I have referred him to Dr. Doran Durand. The other issue is that his compliance with hyperbarics has been minimal because of his work schedule and given his underlying decision I am going to stop this today READMISSION 10/24/2019 MRI 09/29/2019 left foot IMPRESSION: 1. Apparent skin ulceration inferior and lateral to the 5th metatarsal base with underlying heterogeneous T2 signal and enhancement in the subcutaneous fat. Small peripherally enhancing fluid collections along the plantar and lateral aspects of the 5th metatarsal base suspicious for abscesses. 2. Interval amputation through the mid 5th metatarsal with nonspecific low-level marrow edema and enhancement. Given the proximity to the adjacent soft tissue inflammatory changes, osteomyelitis cannot be excluded. 3. The additional bones appear unremarkable. MRI 09/29/2019 right foot IMPRESSION: 1. Soft tissue ulceration lateral to the 5th MTP joint. There is low-level T2 hyperintensity within the 4th and 5th metatarsal heads and adjacent proximal phalanges without abnormal T1  signal or cortical destruction. These findings are nonspecific and could be seen with early marrow edema, hyperemia or early osteomyelitis. No evidence of septic joint. 2. Mild tenosynovitis and synovial enhancement associated with the extensor digitorum tendons at the level of the midfoot. 3. Diffuse low-level muscular T2 hyperintensity and enhancement, most consistent with diabetic myopathy. LEFT FOOT BONE Methicillin resistant staphylococcus aureus Staphylococcus lugdunensis MIC MIC CIPROFLOXACIN >=8 RESISTANT Resistant <=0.5 SENSI... Sensitive CLINDAMYCIN <=0.25 SENS... Sensitive >=8 RESISTANT Resistant ERYTHROMYCIN >=8 RESISTANT Resistant >=8 RESISTANT Resistant GENTAMICIN <=0.5 SENSI... Sensitive <=0.5 SENSI... Sensitive Inducible Clindamycin NEGATIVE Sensitive NEGATIVE Sensitive OXACILLIN >=4  RESISTANT Resistant 2 SENSITIVE Sensitive RIFAMPIN <=0.5 SENSI... Sensitive <=0.5 SENSI... Sensitive TETRACYCLINE <=1 SENSITIVE Sensitive <=1 SENSITIVE Sensitive TRIMETH/SULFA <=10 SENSIT Sensitive <=10 SENSIT Sensitive ... Marland Kitchen.. VANCOMYCIN 1 SENSITIVE Sensitive <=0.5 SENSI... Sensitive Right foot bone . Component 3 wk ago Specimen Description BONE Special Requests RIGHT 4 METATARSAL SAMPLE B Gram Stain NO WBC SEEN NO ORGANISMS SEEN Culture RARE METHICILLIN RESISTANT STAPHYLOCOCCUS AUREUS NO ANAEROBES ISOLATED Performed at [facility], [place] [address] [place], [place] [address] Report Status 10/08/2019 FINAL Organism ID, Bacteria METHICILLIN RESISTANT STAPHYLOCOCCUS AUREUS Resulting Agency [facility] Susceptibility Methicillin resistant staphylococcus aureus MIC CIPROFLOXACIN >=8 RESISTANT Resistant CLINDAMYCIN <=0.25 SENS... Sensitive ERYTHROMYCIN >=8 RESISTANT Resistant GENTAMICIN <=0.5 SENSI... Sensitive Inducible Clindamycin NEGATIVE Sensitive OXACILLIN >=4 RESISTANT Resistant RIFAMPIN <=0.5 SENSI... Sensitive TETRACYCLINE <=1 SENSITIVE Sensitive TRIMETH/SULFA <=10  SENSIT Sensitive ... VANCOMYCIN 1 SENSITIVE Sensitive This is a patient we had in clinic earlier this year with a wound over his left fifth metatarsal head. 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 Doran Durand on 05/16/2019. He seems to have developed problems with wounds on his bilateral feet in June 2021 although he may have had problems earlier than this. He was in an urgent care with a right foot ulcer on 09/26/2019 and given a course of doxycycline. This was apparently after having trouble getting into see orthopedics. He was seen by podiatry on 09/28/2019 noted to have bilateral lower extremity ulcers including the left lateral fifth metatarsal base and the right subfifth met head. It was noted that had purulent drainage at that time. He required hospitalization from 6/20 through 7/2. This was because of worsening right foot wounds. He underwent bilateral operative incision and drainage and bone biopsies bilaterally. Culture results are listed above. He has been referred back to clinic by Dr. Jacqualyn Posey of podiatry. He is also followed by Dr. Megan Salon who saw him yesterday. He was discharged from hospital on Zyvox Flagyl and Levaquin and yesterday changed to doxycycline Flagyl and Levaquin. His inflammatory markers on 6/26 showed a sedimentation rate of 129 and a C-reactive protein of 5. This is improved to 14 and 1.3 respectively. This would indicate improvement. ABIs in our clinic today were 1.23 on the right and 1.20 on the left 11/01/2019 on evaluation today patient appears to be doing fairly well in regard to the wounds on his feet at this point. Fortunately there is no signs of active infection at this time. No fevers,  chills, nausea, vomiting, or diarrhea. He currently is seeing infectious disease and still under their care at this point. Subsequently he also has both wounds which she has not been using collagen on as he did not receive that in his packaging he did not call us and let us know that. Apparently that just was missed on the order. Nonetheless we will get that straightened out today. 8/9-Patient returns for bilateral foot wounds, using Prisma with hydrogel moistened dressings, and the wounds appear stable. Patient using surgical shoes, avoiding much pressure or weightbearing as much as possible 8/16; patient has bilateral foot wounds. 1 on the right lateral foot proximally the other is on the left mid lateral foot. Both required debridement of callus and thick skin around the wounds. We have been using silver collagen  8/27; patient has bilateral lateral foot wounds. The area on the left substantially surrounded by callus and dry skin. This was removed from the wound edge. The underlying wound is small. The area on the right measured somewhat smaller today. 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 Unfortunately I did not update his antibiotics during today's visit. 9/10 I reviewed Dr. Hale Bogus last notes he felt he had completed antibiotics his inflammatory markers were reasonably well controlled. He has a small wound on the lateral left foot and a tiny area on the right which is just above closed. He is using Hydrofera Blue with border foam he has bilateral surgical shoes 9/24; 2 week f/u. doing well. right foot is closed. left foot still undermined. 10/14; right foot remains closed at the fifth met head. The area over the base of the left fifth metatarsal has a small open area but considerable undermining towards the plantar foot. Thick callus skin around this suggests an adequate pressure relief. We have talked about this. He says  he is going to go back into his cam boot. I suggested a total contact cast he did not seem enamored with this suggestion 10/26; left foot base of the fifth metatarsal. Same condition as last time. He has skin over the area with an open wound however the skin is not adherent. He went to see Dr. Earleen Newport who did an x-ray and culture of his foot I have not reviewed the x-ray but the patient was not told anything. He is on doxycycline 11/11; since the patient was last here he was in the emergency room on 10/30 he was concerned about swelling in the left foot. They did not do any cultures or x-rays. They changed his antibiotics to cephalexin. Previous culture showed group B strep. The cephalexin is appropriate as doxycycline has less than predictable coverage. Arrives in clinic today with swelling over this area under the wound. He also has a new wound on the right fifth metatarsal head 11/18; the patient has a difficult wound on the lateral aspect of the left fifth metatarsal head. The wound was almost ballotable last week I opened it slightly expecting to see purulence however there was just bleeding. I cultured this this was negative. X-ray unchanged. We are trying to get an MRI but I am not sure were going to be able to get this through his insurance. He also has an area on the right lateral fifth metatarsal head this looks healthier 12/3; the patient finally got our MRI. Surprisingly this did not show osteomyelitis. I did show the soft tissue ulceration at the lateral plantar aspect of the fifth metatarsal base with a tiny residual 6 mm abscess overlying the superficial fascia I have tried to culture this area I have not been able to get this to grow anything. Nevertheless the protruding tissue looks aggravated. I suspect we should try to treat the underlying "abscess with broad-spectrum antibiotics. I am going to start him on Levaquin and Flagyl. He has much less edema in his legs and I am going to  continue to wrap his legs and see him weekly 12/10. I started Levaquin and Flagyl on him last week. He just picked up the Flagyl apparently there was some delay. The worry is the wound on the left fifth metatarsal base which is substantial and worsening. His foot looks like he inverts at the ankle making this a weightbearing surface. Certainly no improvement in fact  I think the measurements of this are somewhat worse. We have been using 12/17; he apparently just got the Levaquin yesterday this is 2 weeks after the fact. He has completed the Flagyl. The area over the left fifth metatarsal base still has protruding granulation tissue although it does not look quite as bad as it did some weeks ago. He has severe bilateral lymphedema although we have not been treating him for wounds on his legs this is definitely going to require compression. There was so much edema in the left I did not wish to put him in a total contact cast today. I am going to increase his compression from 3-4 layer. The area on the right lateral fifth met head actually look quite good and superficial. 12/23; patient arrived with callus on the right fifth met head and the substantial hyper granulated callused wound on the base of his fifth metatarsal. He says he is completing his Levaquin in 2 days but I do not think that adds up with what I gave him but I will have to double check this. We are using Hydrofera Blue on both areas. My plan is to put the left leg in a cast the week after New Year's 04/06/2020; patient's wounds about the same. Right lateral fifth metatarsal head and left lateral foot over the base of the fifth metatarsal. There is undermining on the left lateral foot which I removed before application of total contact cast continuing with Hydrofera Blue new. Patient tells me he was seen by endocrinology today lab work was done [Dr. Kerr]. Also wondering whether he was referred to cardiology. I went over some lab work from  previously does not have chronic renal failure certainly not nephrotic range proteinuria he does have very poorly controlled diabetes but this is not his most updated lab work. Hemoglobin A1c has been over 11 1/10; the patient had a considerable amount of leakage towards mid part of his left foot with macerated skin however the wound surface looks better the area on the right lateral fifth met head is better as well. I am going to change the dressing on the left foot under the total contact cast to silver alginate, continue with Hydrofera Blue on the right. 1/20; patient was in the total contact cast for 10 days. Considerable amount of drainage although the skin around the wound does not look too bad on the left foot. The area on the right fifth metatarsal head is closed. Our nursing staff reports large amount of drainage out of the left lateral foot wound 1/25; continues with copious amounts of drainage described by our intake staff. PCR culture I did last week showed E. coli and Enterococcus faecalis and low quantities. Multiple resistance genes documented including extended spectrum beta lactamase, MRSA, MRSE, quinolone, tetracycline. The wound is not quite as good this week as it was 5 days ago but about the same size 2/3; continues with copious amounts of malodorous drainage per our intake nurse. The PCR culture I did 2 weeks ago showed E. coli and low quantities of Enterococcus. There were multiple resistance genes detected. I put Neosporin on him last week although this does not seem to have helped. The wound is slightly deeper today. Offloading continues to be an issue here although with the amount of drainage she has a total contact cast is just not going to work 2/10; moderate amount of drainage. Patient reports he cannot get his stocking on over the dressing. I told him we have to  do that the nurse gave him suggestions on how to make this work. The wound is on the bottom and lateral part of  his left foot. Is cultured predominantly grew low amounts of Enterococcus, E. coli and anaerobes. There were multiple resistance genes detected including extended spectrum beta lactamase, quinolone, tetracycline. I could not think of an easy oral combination to address this so for now I am going to do topical antibiotics provided by [facility] I think the main agents here are vancomycin and an aminoglycoside. We have to be able to give him access to the wounds to get the topical antibiotic on 2/17; moderate amount of drainage this is unchanged. He has his Keystone topical antibiotic against the deep tissue culture organisms. He has been using this and changing the dressing daily. Silver alginate on the wound surface. 2/24; using Keystone antibiotic with silver alginate on the top. He had too much drainage for a total contact cast at one point although I think that is improving and I think in the next week or 2 it might be possible to replace a total contact cast I did not do this today. In general the wound surface looks healthy however he continues to have thick rims of skin and subcutaneous tissue around the wide area of the circumference which I debrided 06/04/2020 upon evaluation today patient appears to be doing well in regard to his wound. I do feel like he is showing signs of improvement. There is little bit of callus and dead tissue around the edges of the wound as well as what appears to be a little bit of a sinus tract that is off to the side laterally I would perform debridement to clear that away today. 3/17; left lateral foot. The wound looks about the same as I remember. Not much depth surface looks healthy. No evidence of infection 3/25; left lateral foot. Wound surface looks about the same. Separating epithelium from the circumference. There really is no evidence of infection here however not making progress by my view 3/29; left lateral foot. Surface of the wound again looks reasonably  healthy still thick skin and subcutaneous tissue around the wound margins. There is no evidence of infection. One of the concerns being brought up by the nurses has again the amount of drainage vis--vis continued use of a total contact cast 4/5; left lateral foot at roughly the base of the fifth metatarsal. Nice healthy looking granulated tissue with rims of epithelialization. The overall wound measurements are not any better but the tissue looks healthy. The only concern is the amount of drainage although he has no surrounding maceration with what we have been doing recently to absorb fluid and protect his skin. He also has lymphedema. 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 He states that the kerlix/coban under the TCC rolled down and could not get it back up. He has been using an offloading scooter and has somehow hurt his right foot using this device. This happened last week. He states that the side of his right foot developed a blister and opened. The top of his foot also has a few small open wounds he thinks is due to his socks rubbing in his shoes. He has not been using  any dressings to the wound. He denies purulent drainage, fever/chills or erythema to the wounds. 4/22; patient presents for 1 week follow-up. He developed new wounds to the right foot that were evaluated at last clinic visit. He continues to have a total contact cast to the left leg and he reports no issues. He has been using silver collagen to the right foot wounds with no issues. He denies purulent drainage, fever/chills or erythema to the right foot wounds. He has no complaints today 4/25; patient presents for 1 week follow-up. He has a total contact cast of the left leg and  reports no issues. He has been using silver alginate to the right foot wound. He denies purulent drainage, fever/chills or erythema to the right foot wounds. 5/2 patient presents for 1 week follow-up. T contact cast on the left. The wound which is on the base of the plantar foot at the base of the fifth metatarsal otal actually looks quite good and dimensions continue to gradually contract. HOWEVER the area on the right lateral fifth metatarsal head is much larger than what I remember from 2 weeks ago. Once more is he has significant levels of hypergranulation. Noteworthy that he had this same hyper granulated response on his wound on the left foot at one point in time. So much so that he I thought there was an underlying fluid collection. Based on this I think this just needs debridement. 5/9; the wound on the left actually continues to be gradually smaller with a healthy surface. Slight amount of drainage and maceration of the skin around but not too bad. However he has a large wound over the right fifth metatarsal head very much in the same configuration as his left foot wound was initially. I used silver nitrate to address the hyper granulated tissue no mechanical debridement 5/16; area on the left foot did not look as healthy this week deeper thick surrounding macerated skin and subcutaneous tissue. oo The area on the right foot fifth met head was about the same oo The area on the right ankle that we identified last week is completely broken down into an open wound presumably a stocking rubbing issue 5/23; patient has been using a total contact cast to the left side. He has been using silver alginate underneath. He has also been using silver alginate to the right foot wounds. He has no complaints today. He denies any signs of infection. 5/31; the left-sided wound looks some better measure smaller surface granulation looks better. We have been using silver alginate under the total contact  cast oo The large area on his right fifth met head and right dorsal foot look about the same still using silver alginate 6/6; neither side is good as I was hoping although the surface area dimensions are better. A lot of maceration on his left and right foot around the wound edge. Area on the dorsal right foot looks better. He says he was traveling. I am not sure what does the amount of maceration around the plantar wounds may be drainage issues 6/13; in general the wound surfaces look quite good on both sides. Macerated skin and raised edges around the wound required debridement although in general especially on the left the surface area seems improved. oo The area on the right dorsal ankle is about the same I thought this would not be such a problem to close 6/20; not much change in either wound although the one on the right looks a little better. Both wounds  have thick macerated edges to the skin requiring debridements. We have been using silver alginate. The area on the dorsal right ankle is still open I thought this would be closed. 6/28; patient comes in today with a marked deterioration in the right foot wound fifth met head. Wide area of exposed bone this is a drastic change from last time. The area on the left there we have been casting is stagnant. We have been using silver alginate in both wound areas. 7/5; bone culture I did for PCR last time was positive for Pseudomonas, group B strep, Enterococcus and Staph aureus. There was no suggestion of methicillin resistance or ampicillin resistant genes. This was resistant to tetracycline however He comes into the clinic today with the area over his right plantar fifth metatarsal head which had been doing so well 2 weeks ago completely necrotic feeling bone. I do not know that this is going to be salvageable. The left foot wound is certainly no smaller but it has a better surface and is superficial. 7/8; patient called in this morning to say  that his total contact cast was rubbing against his foot. He states he is doing fine overall. He denies signs of infection. 7/12; continued deterioration in the wound over the right fifth metatarsal head crumbling bone. This is not going to be salvageable. The patient agrees and wants to be referred to Dr. Doran Durand which we will attempt to arrange as soon as possible. I am going to continue him on antibiotics as long as that takes so I will renew those today. The area on the left foot which is the base of the fifth metatarsal continues to look somewhat better. Healthy looking tissue no depth no debridement is necessary here. 7/20; the patient was kindly seen by Dr. Doran Durand of orthopedics on 10/19/2020. 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 Towards this end we have taken him out of the total contact cast on the left we will put him in a wrap with Hydrofera Blue. As I understand things surgery is planned for 7/21 7/27; patient had his surgery last Thursday. 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 Apparently everything went well we did not still disturb that today The area on the left foot actually looks quite good. He has been much less mobile which probably explains this he did not seem to do well in the total contact cast secondary to drainage and maceration I think. 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 Fortunately there is no evidence of active infection at this time. No fevers, chills, nausea, vomiting, or diarrhea. Overall I think that he is actually doing extremely well. Nonetheless I do believe that he is staying off of this more following the surgery in his right foot that is the reason the left is doing so great. 8/16; left plantar foot wound. This looks smaller than the last time I  saw this he is using Hydrofera Blue. The surgical wound on the right foot is being followed by Dr. Doran Durand we did not look at this today. He has surgical shoes on both feet 8/23; left plantar foot wound not as good this week. Surrounding macerated skin and subcutaneous tissue everything looks moist and wet. I do not think he is offloading this adequately.  He is using a surgical shoe Apparently the right foot surgical wound is not open although I did not check his foot 8/31; left plantar foot lateral aspect. Much improved this week. He has no maceration. Some improvement in the surface area of the wound but most impressively the depth is come in we are using silver alginate. The patient is a Product/process development scientist. He is asked that we write him a letter so he can go back to work. I have also tried to see if we can write something that will allow him to limit the amount of time that he is on his foot at work. 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 Hopefully the latter can be avoided 9/6; I believe the patient missed an appointment last week. He arrives in today with a wound looking roughly the same certainly no better. Undermining laterally and also inferiorly. We used molecuLight today in training with the patient's permission.. We are using silver alginate 9/21 wound is measuring bigger this week although this may have to do with the aggressive circumferential debridement last week in response to the blush fluorescence on the MolecuLight. Culture I did last week showed significant MSSA and E. coli. I put him on Augmentin but he has not started it yet. We are also going to send this for compounded antibiotics at [facility]. There is no evidence of systemic infection 9/29; silver alginate. His Keystone arrived. He is completing Augmentin in 2 days. Offloading in a cam boot. Moderate drainage per our intake staff 10/5;  using silver alginate. He has been using his [place]. He has completed his Augmentin. Per our intake nurse still a lot of drainage, far too much to consider a total contact cast. Wound measures about the same. He had the same undermining area that I defined last week from a roughly 11-3. I remove this today 10/12; using silver alginate he is using the [place]. He comes in for a nurse visit hence we are applying [employer] twice a week. Measuring slightly better today and less notable drainage. Extensive debridement of the wound edge last time 10/18; using topical Keystone and silver alginate and a soft cast. Wound measurements about the same. Drainage was through his soft cast. We are changing this twice a week Tuesdays and Friday 10/25; comes in with moderate drainage. Still using Keystone silver alginate and a soft cast. Wound dimensions completely the same.He has a lot of edema in the left leg he has lymphedema. Asking for [name] to consider wrapping him as he cannot get his stocking on over the soft cast 11/2; comes in with moderate to large drainage slightly smaller in terms of width we have been using [place]. His wound looks satisfactory but not much improvement 11/4; patient presents today for obligatory cast change. Has no issues or complaints today. He denies signs of infection. 11/9; patient traveled this weekend to [place], was on the cast quite a bit. Staining of the cast with black material from his walking boot. Drainage was not quite as bad as we feared. Using silver alginate and Keystone 11/16; we do not have size for cast therefore we have been putting a soft cast on him since the change on Friday. Still a significant amount of drainage necessitating changing twice a week. We have been using the Keystone at cast changes either hard or soft as well as silver alginate Comes in the clinic with  things actually looking fairly good improvement in width. He says his offloading is about the  same 02/24/2021 upon evaluation today patient actually comes back in and is doing excellent in regard to his foot ulcer this is significantly smaller even compared to the last visit. The soft cast seems to have done extremely well for him which is great news. I do not see any signs of infection minimal debridement will be needed today. 11/30; left lateral foot much improved half a centimeter improvement in surface area. No evidence of infection. He seems to be doing better with the soft cast in the TCC therefore we will continue with this. He comes back in later in the week for a change with the nurses. This is due to drainage 12/6; no improvement in dimensions. Under illumination some debris on the surface we have been using silver alginate, soft cast. If there is anything optimistic here he seems to have have less drainage 12/13. Dimensions are improved both length and width and slightly in depth. Appears to be quite healthy today. Raised edges of this thick skin and callus around the edges however. He is in a soft cast were bringing him back once for a change on Friday. Drainage is better 12/20. Dimensions are improved. He still has raised edges of thick skin and callus around the edges. We are using a soft cast 12/28; comes in today with thick callus around the wound. Using silver under alginate under a soft cast. I do not think there is much improvement in any measurement 2023 04/06/2021; patient was put in a total contact cast. Unfortunately not much change in surface area 1/10; not much different still thick callus and skin around the edge in spite of the total contact cast. This was just debrided last week we have been using the [facility] compounded antibiotic and silver alginate under a total contact cast 1/18 the patient's wound on the left side is doing nicely. smaller HOWEVER he comes in today with a wound on the right foot laterally. blister most likely serosangquenous drainage 1/24; the  patient continues to do well in terms of the plantar left foot which is continued to contract using silver alginate under the total contact cast HOWEVER the right lateral foot is bigger with denuded skin around the edges. I used pickups and a #15 scalpel to remove this this looks like the remanence of a large blister. Cannot rule out infection. Culture in this area I did last week showed Staphylococcus lugdunensis few colonies. I am going to try to address this with his [employer] antibiotic that is done so well on the left having linezolid and this should cover the staph 2/1; the patient's wound on his left foot which was the original plantar foot wound thick skin and eschar around the edges even in the total contact cast but the wound surface does not look too bad The real problem is on how his right lateral foot at roughly the base of the fifth metatarsal. The wound is completely necrotic more worrisome than that there is swelling around the edges of this. We have been using silver alginate on both wounds and Keystone on the right foot. Unfortunately I think he is going to require systemic antibiotics while we await cultures. He did not get the x-ray done that we ordered last week [lost the prescription 2/7; disappointingly in the area on the left foot which we are treating with a total contact cast is still not closed although it is much smaller.  He continues to have a lot of callus around the wound edge. -Right lateral foot culture I did last week was negative x-ray also negative for osteomyelitis. 2/15: TCC silver alginate on the left and silver alginate on the right lateral. No real improvement in either area 05/26/2021: T oday, the wounds are roughly the same size as at his previous visit, post-debridement. He continues to endorse fairly substantial drainage, particularly on the right. He has been in a total contact cast on the left. There is still some callus surrounding this lesion. On the  right, the periwound skin is quite macerated, along with surrounding callus. The center of the right-sided wound also has some dark, densely adherent material, which is very difficult to remove. 06/02/2021: Today, both wounds are slightly smaller. He has been using zinc oxide ointment around the right ulcer and the degree of maceration has improved markedly. There continues to be an area of nonviable tissue in the center of the right sided ulcer. The left-sided wound, which has been in the total contact cast. Appears clean and the degree of callus around it is less than previously. 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 He had to take the stairs and both wounds have increased in size. The left foot, which has been in a total contact cast, has developed a tunnel tracking to the lateral aspect of his foot. The nonviable tissue in the center of the right-sided ulcer remains recalcitrant to debridement. There is significant undermining surrounding the entirety of the left sided wound. 06/16/2021: The elevator at school has been fixed and the patient has been able to avoid putting as much weight on his wounds over the past week. We converted the left foot wound into a single lesion today, but despite this, the wound is actually smaller. The base is healthy with limited periwound callus. On the right, the central necrotic area is still present. He continues to be quite macerated around the right sided wound, despite applying barrier cream. This does, however, have the benefit of softening the callus to make it more easily removable. 06/23/2021: Today, the left wound is smaller. The lateral aspect that had opened up previously is now closed. The wound base has a healthy bed of granulation tissue and minimal slough. Unfortunately, on the right, the wound is larger and continues to be fairly macerated. He has also reopened the wound at his  right ankle. He thinks this is due to the gait he has adopted secondary to his total contact cast and boot. 06/30/2021: T oday, both wounds are a little bit larger. The lateral aspect on the left has remained closed. He continues to have significant periwound maceration. The culture that I took from the right sided wound grew a population of bacteria that is not covered by his current [facility] antibiotic. The center of the right- sided wound continues to appear necrotic with nonviable fat. It probes deeper today, but does not reach bone. 07/07/2021: The periwound maceration is a little bit less today. The right lateral foot wound has some areas that appear more viable and the necrotic center also looks a little bit better. The wound on the dorsal surface of his right foot near the ankle is contracting and the surface appears healthy. The left plantar wound surface looks healthy, but there is some new undermining on the medial portion. He did get his new Keystone antibiotic and began applying that to the right foot wound  on Saturday. 07/14/2021: The intake nurse reported substantial drainage from his wounds, but the periwound skin actually looks better than is typical for him. The wound on the dorsal surface of his right foot near the ankle is smaller and just has a small open area underneath some dried eschar. The left plantar wound surface looks healthy and there has been no significant accumulation of callus. The right lateral foot wound looks quite a bit better, with the central portion, which typically appears necrotic, looking more viable albeit pale. 07/22/2021: His left foot is extremely macerated today. The wound is about the same size. The wound on the dorsal surface of his right foot near the ankle had closed, but he traumatized it removing the dressing and there is a tiny skin tear in that location. The right lateral foot wound is bigger, but the surface appears healthy. 07/30/2021: The wound on  the dorsal surface of his right foot near the ankle is closed. The right lateral foot wound again is a little bit bigger due to some undermining. The periwound skin is in better condition, however. He has been applying zinc oxide. The wound surface is a little bit dry today. On the left, he does not have the substantial maceration that we frequently see. The wound itself is smaller and has a clean surface. 08/06/2021: Both wounds seem to have deteriorated over the past week. The right lateral foot wound has a dry surface but the periwound is boggy.. Overall wound dimensions are about the same. On the left, the wound is about the same size, but there is more undermining present underneath periwound callus. 08/13/2021: The right sided wound looks about the same, but on the left there has been substantial deterioration. The undermining continues to extend under periwound callus. Once this was removed, substantial extension of the wound was present. There is no odor or purulent drainage but clearly the wounds have broken down. 08/20/2021: The wounds look about the same today. He has been out of his total contact cast and has just been changing the dressings using topical Keystone with PolyMem Ag, Kerlix and Ace bandages. The wound on the top of his right ankle has reopened but this is quite small. There was a little bit of purulent material that I expressed when examining this wound. 08/24/2021: After the aggressive debridement I performed at his last visit, the wounds actually look a little bit better today. They are smaller with the exception of the wound on the top of his right ankle which is a little bit bigger as some more skin pulled off when he was changing his dressing. We are using topical Keystone with PolyMem Ag Kerlix and Ace bandages. 09/02/2021: There has been really no change to any of his wounds. 09/16/2021: The patient was hospitalized last week with nausea, vomiting, and dehydration. He says  that while he was in the hospital, his wounds were not really addressed properly. T oday, both plantar foot wounds are larger and the periwound skin is macerated. The wound on the dorsum of his right foot has a scab on the top. The right foot now has a crater where previously he had had nonviable fat. It looks as though this simply died and fell out. The periwound callus is wet. Patient History Information obtained from Patient. Family History No family history of Cancer, Diabetes, Hereditary Spherocytosis, Hypertension, Kidney Disease, Lung Disease, Seizures, Stroke, Thyroid Problems, Tuberculosis. Social History Never smoker, Marital Status - Single, Alcohol Use - Rarely, Drug Use -  No History, Caffeine Use - Never. Medical History Eyes Denies history of Cataracts, Glaucoma, Optic Neuritis Ear/Nose/Mouth/Throat Denies history of Chronic sinus problems/congestion, Middle ear problems Hematologic/Lymphatic Denies history of Anemia, Hemophilia, Human Immunodeficiency Virus, Lymphedema, Sickle Cell Disease Respiratory Denies history of Aspiration, Asthma, Chronic Obstructive Pulmonary Disease (COPD), Pneumothorax, Sleep Apnea, Tuberculosis Cardiovascular Denies history of Angina, Arrhythmia, Congestive Heart Failure, Coronary Artery Disease, Deep Vein Thrombosis, Hypertension, Hypotension, Myocardial Infarction, Peripheral Arterial Disease, Peripheral Venous Disease, Phlebitis, Vasculitis Gastrointestinal Denies history of Cirrhosis , Colitis, Crohnoos, Hepatitis A, Hepatitis B, Hepatitis C Endocrine Patient has history of Type II Diabetes Denies history of Type I Diabetes Immunological Denies history of Lupus Erythematosus, Raynaudoos, Scleroderma Integumentary (Skin) Denies history of History of Burn Musculoskeletal Denies history of Gout, Rheumatoid Arthritis, Osteoarthritis, Osteomyelitis Neurologic Denies history of Dementia, Neuropathy, Quadriplegia, Paraplegia, Seizure  Disorder Oncologic Denies history of Received Chemotherapy, Received Radiation Psychiatric Denies history of Anorexia/bulimia, Confinement Anxiety Hospitalization/Surgery History - 11/1-11/06/2018- sepsis foot infection. - 11/4-11/5 02 sats low respiratory distress. Objective Constitutional Slightly hypotensive, asymptomatic.Marland Kitchen Slightly tachycardic, asymptomatic. No acute distress.. Vitals Time Taken: 3:48 PM, Height: 77 in, Weight: 280 lbs, BMI: 33.2, Temperature: 98.9 F, Pulse: 109 bpm, Respiratory Rate: 18 breaths/min, Blood Pressure: 92/64 mmHg, Capillary Blood Glucose: 107 mg/dl. General Notes: glucose per pt report this AM Respiratory Normal work of breathing on room air.. General Notes: 09/16/2021: T oday, both plantar foot wounds are larger and the periwound skin is macerated. The wound on the dorsum of his right foot has a scab on the top. The right foot now has a crater where previously he had had nonviable fat. It looks as though this simply died and fell out. The periwound callus is wet. His right foot is red, swollen, and warm, as is the lower leg on that side. There is no purulent drainage or significant odor from his wounds. Integumentary (Hair, Skin) Wound #11 status is Open. Original cause of wound was Gradually Appeared. The date acquired was: 08/20/2021. The wound has been in treatment 3 weeks. The wound is located on the [place]. The wound measures 0.5cm length x 1cm width x 0.1cm depth; 0.393cm^2 area and 0.039cm^3 volume. There is Fat Layer (Subcutaneous Tissue) exposed. There is no tunneling or undermining noted. There is a medium amount of serosanguineous drainage noted. The wound margin is distinct with the outline attached to the wound base. There is medium (34-66%) red, pink granulation within the wound bed. There is a medium (34-66%) amount of necrotic tissue within the wound bed including Eschar and Adherent Slough. Wound #3 status is Open. Original cause  of wound was Trauma. The date acquired was: 10/02/2019. The wound has been in treatment 99 weeks. The wound is located on the [place]. The wound measures 3.5cm length x 3.7cm width x 0.4cm depth; 10.171cm^2 area and 4.068cm^3 volume. There is undermining starting at 4:00 and ending at 6:00 with a maximum distance of 0.8cm. There is a medium amount of serosanguineous drainage noted. The wound margin is well defined and not attached to the wound base. There is large (67-100%) red, pink granulation within the wound bed. There is a small (1-33%) amount of necrotic tissue within the wound bed including Adherent Slough. Wound #8 status is Open. Original cause of wound was Blister. The date acquired was: 04/18/2021. The wound has been in treatment 21 weeks. The wound is located on the Right,Lateral Foot. The wound measures 4cm length x 3.3cm width x 1cm depth; 10.367cm^2 area and 10.367cm^3 volume.  There is Fat Layer (Subcutaneous Tissue) exposed. There is undermining starting at 3:00 and ending at 5:00 with a maximum distance of 2cm. There is a large amount of serosanguineous drainage noted. The wound margin is well defined and not attached to the wound base. There is large (67-100%) red, pink granulation within the wound bed. There is a small (1-33%) amount of necrotic tissue within the wound bed including Adherent Slough. 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 There was a Excisional Skin/Subcutaneous Tissue  Debridement with a total area of 12.95 sq cm performed by [name], MD. With the following instrument(s): Curette to remove Non-Viable tissue/material. Material removed includes Callus and Subcutaneous Tissue and after achieving pain control using Other (Benzocaine 20%). No specimens were taken. A time out was conducted at 16:15, prior to the start of the procedure. A Minimum amount of bleeding was controlled with Pressure. The procedure was tolerated well with a pain level of 0 throughout and a pain level of 0 following the procedure. Post Debridement Measurements: 3.5cm length x 3.7cm width x 0.4cm depth; 4.068cm^3 volume. Character of Wound/Ulcer Post Debridement is improved. Severity of Tissue Post Debridement is: Fat layer exposed. 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 There was a Excisional Skin/Subcutaneous Tissue Debridement with a total area of 13.2 sq cm performed by [name], MD. With the following instrument(s): Curette to remove Non-Viable tissue/material. Material removed includes Callus and Subcutaneous Tissue and after achieving pain control using Other (Benzocaine 20%). No specimens were taken. A time out was conducted at 16:15, prior to the start of the procedure. A Moderate amount of bleeding was controlled with Silver Nitrate. The procedure was tolerated well with a pain level of 0 throughout and a pain level of 0 following the procedure. Post Debridement Measurements: 4cm length x 3.3cm width x 1cm depth; 10.367cm^3 volume. Character of Wound/Ulcer Post Debridement is improved. Severity of Tissue Post Debridement is: Fat layer exposed. Post procedure Diagnosis Wound #8: Same as Pre-Procedure Plan Follow-up Appointments: Return Appointment in 1 week. - Dr.  [name] - Room 3 Friday June 23rd at 3:15 pm Bathing/ Shower/ Hygiene: May shower with protection but do not get wound dressing(s) wet. - Use Cast Protector Bag on Right and left legs Edema Control - Lymphedema / SCD / Other: Elevate legs to the level of the heart or above for 30 minutes daily and/or when sitting, a frequency of: - throughout the day Avoid standing for long periods of time. Exercise regularly Compression stocking or Garment 20-30 mm/Hg pressure to: - to right leg daily Off-Loading: Open toe surgical shoe to: - right foot Other: - Soft cast to left foot Additional Orders / Instructions: Follow Nutritious Diet The following medication(s) was prescribed: Bactrim DS oral 800 mg-160 mg tablet 1 tab PO BID x 14 days starting 09/16/2021 Cipro oral 500 mg tablet 1 tab PO BID x 14 days starting 09/16/2021 WOUND #11: -  Foot Wound Laterality: Right, Anterior Cleanser: Soap and Water 1 x Per Day/30 Days Discharge Instructions: May shower and wash wound with dial antibacterial soap and water prior to dressing change. Cleanser: Byram Ancillary Kit - 15 Day Supply (Generic) 1 x Per Day/30 Days Discharge Instructions: Use supplies as instructed; Kit contains: (15) Saline Bullets; (15) 3x3 Gauze; 15 pr Gloves Topical: keystone antibiotic compound 1 x Per Day/30 Days Prim Dressing: KerraCel Ag Gelling Fiber Dressing, 4x5 in (silver alginate) 1 x Per Day/30 Days ary Discharge Instructions: Apply silver alginate to wound bed as instructed Secondary Dressing: ABD Pad, 8x10 (Generic) 1 x Per Day/30 Days Discharge Instructions: Apply over primary dressing as directed. Secondary Dressing: Optifoam Non-Adhesive Dressing, 4x4 in (Generic) 1 x Per Day/30 Days Discharge Instructions: Apply over primary dressing as directed. Secondary Dressing: Zetuvit Plus 4x8 in (Generic) 1 x Per Day/30 Days Discharge Instructions: Apply over primary dressing as directed. Secondary Dressing: NonWoven Sponge, 4x4  (in/in) (Generic) 1 x Per Day/30 Days Secured With: Coban Self-Adherent Wrap 4x5 (in/yd) (Generic) 1 x Per Day/30 Days Discharge Instructions: Secure with Coban as directed. Secured With: [employer], 4.5x3.1 (in/yd) (Generic) 1 x Per Day/30 Days Discharge Instructions: Secure with Kerlix as directed. Secured With: 69M Medipore H Soft Cloth Surgical T ape, 4 x 10 (in/yd) (Generic) 1 x Per Day/30 Days Discharge Instructions: Secure with tape as directed. WOUND #3: - Foot Wound Laterality: Plantar, Left, Lateral Cleanser: Soap and Water 1 x Per Day/30 Days Discharge Instructions: May shower and wash wound with dial antibacterial soap and water prior to dressing change. Cleanser: Byram Ancillary Kit - 15 Day Supply (Generic) 1 x Per Day/30 Days Discharge Instructions: Use supplies as instructed; Kit contains: (15) Saline Bullets; (15) 3x3 Gauze; 15 pr Gloves Topical: keystone antibiotic compound 1 x Per Day/30 Days Prim Dressing: KerraCel Ag Gelling Fiber Dressing, 4x5 in (silver alginate) 1 x Per Day/30 Days ary Discharge Instructions: Apply silver alginate to wound bed as instructed Secondary Dressing: ABD Pad, 8x10 (Generic) 1 x Per Day/30 Days Discharge Instructions: Apply over primary dressing as directed. Secondary Dressing: Optifoam Non-Adhesive Dressing, 4x4 in (Generic) 1 x Per Day/30 Days Discharge Instructions: Apply over primary dressing as directed. Secondary Dressing: Zetuvit Plus 4x8 in (Generic) 1 x Per Day/30 Days Discharge Instructions: Apply over primary dressing as directed. Secondary Dressing: NonWoven Sponge, 4x4 (in/in) (Generic) 1 x Per Day/30 Days Secured With: Coban Self-Adherent Wrap 4x5 (in/yd) (Generic) 1 x Per Day/30 Days Discharge Instructions: Secure with Coban as directed. Secured With: [employer], 4.5x3.1 (in/yd) (Generic) 1 x Per Day/30 Days Discharge Instructions: Secure with Kerlix as directed. Secured With: 69M Medipore H Soft Cloth  Surgical T ape, 4 x 10 (in/yd) (Generic) 1 x Per Day/30 Days Discharge Instructions: Secure with tape as directed. WOUND #8: - Foot Wound Laterality: Right, Lateral Cleanser: Soap and Water 1 x Per Day/30 Days Discharge Instructions: May shower and wash wound with dial antibacterial soap and water prior to dressing change. Cleanser: Byram Ancillary Kit - 15 Day Supply (Generic) 1 x Per Day/30 Days Discharge Instructions: Use supplies as instructed; Kit contains: (15) Saline Bullets; (15) 3x3 Gauze; 15 pr Gloves Topical: keystone antibiotic compound 1 x Per Day/30 Days Prim Dressing: KerraCel Ag Gelling Fiber Dressing, 4x5 in (silver alginate) 1 x Per Day/30 Days ary Discharge Instructions: Apply silver alginate to wound bed as instructed Secondary Dressing: ABD Pad, 8x10 (Generic) 1 x Per Day/30 Days Discharge Instructions: Apply over primary dressing as directed.  Secondary Dressing: Optifoam Non-Adhesive Dressing, 4x4 in (Generic) 1 x Per Day/30 Days Discharge Instructions: Apply over primary dressing as directed. Secondary Dressing: Zetuvit Plus 4x8 in (Generic) 1 x Per Day/30 Days Discharge Instructions: Apply over primary dressing as directed. Secondary Dressing: NonWoven Sponge, 4x4 (in/in) (Generic) 1 x Per Day/30 Days Secured With: Coban Self-Adherent Wrap 4x5 (in/yd) (Generic) 1 x Per Day/30 Days Discharge Instructions: Secure with Coban as directed. Secured With: [employer], 4.5x3.1 (in/yd) (Generic) 1 x Per Day/30 Days Discharge Instructions: Secure with Kerlix as directed. Secured With: 91M Medipore H Soft Cloth Surgical T ape, 4 x 10 (in/yd) (Generic) 1 x Per Day/30 Days Discharge Instructions: Secure with tape as directed. 09/16/2021: T oday, both plantar foot wounds are larger and the periwound skin is macerated. The wound on the dorsum of his right foot has a scab on the top. The right foot now has a crater where previously he had had nonviable fat. It looks as though  this simply died and fell out. The periwound callus is wet. His right foot is red, swollen, and warm, as is the lower leg on that side. There is no purulent drainage or significant odor from his wounds. I used a curette to debride periwound callus, slough, and subcutaneous tissue from his wounds. I am concerned that he has an infection on the right. I have prescribed antibiotics for him to take orally. Due to the moisture accumulation, I am changing his dressing to silver alginate in an effort to try and dry them up a little bit. Recent foot x-rays did not show any sign of osteomyelitis, but these were taken before the wound deteriorated. We will continue to keep this high on our radar screen. He is out of his topical [employer], but he will contact the company and order a new batch. I will see him back in 1 week. Electronic Signature(s) Signed: 09/16/2021 4:41:35 PM By: [name] MD FACS Entered By: [name] on 09/16/2021 16:41:35 -------------------------------------------------------------------------------- HxROS Details Patient Name: Date of Service: [name], [name] 09/16/2021 3:15 PM Medical Record Number: [id number] Patient Account Number: [id number] Date of Birth/Sex: Treating RN: 01-10-1986 (36 y.o. M) Primary Care Provider: [name] Other Clinician: Referring Provider: Treating Provider/Extender: [name] in Treatment: 99 Information Obtained From Patient Eyes Medical History: Negative for: Cataracts; Glaucoma; Optic Neuritis Ear/Nose/Mouth/Throat Medical History: Negative for: Chronic sinus problems/congestion; Middle ear problems Hematologic/Lymphatic Medical History: Negative for: Anemia; Hemophilia; Human Immunodeficiency Virus; Lymphedema; Sickle Cell Disease Respiratory Medical History: Negative for: Aspiration; Asthma; Chronic Obstructive Pulmonary Disease (COPD); Pneumothorax; Sleep Apnea;  Tuberculosis Cardiovascular Medical History: Negative for: Angina; Arrhythmia; Congestive Heart Failure; Coronary Artery Disease; Deep Vein Thrombosis; Hypertension; Hypotension; Myocardial Infarction; Peripheral Arterial Disease; Peripheral Venous Disease; Phlebitis; Vasculitis Gastrointestinal Medical History: Negative for: Cirrhosis ; Colitis; Crohns; Hepatitis A; Hepatitis B; Hepatitis C Endocrine Medical History: Positive for: Type II Diabetes Negative for: Type I Diabetes Time with diabetes: 8 Treated with: Insulin Blood sugar tested every day: No Immunological Medical History: Negative for: Lupus Erythematosus; Raynauds; Scleroderma Integumentary (Skin) Medical History: Negative for: History of Burn Musculoskeletal Medical History: Negative for: Gout; Rheumatoid Arthritis; Osteoarthritis; Osteomyelitis Neurologic Medical History: Negative for: Dementia; Neuropathy; Quadriplegia; Paraplegia; Seizure Disorder Oncologic Medical History: Negative for: Received Chemotherapy; Received Radiation Psychiatric Medical History: Negative for: Anorexia/bulimia; Confinement Anxiety Immunizations Pneumococcal Vaccine: Received Pneumococcal Vaccination: No Implantable Devices None Hospitalization / Surgery History Type of Hospitalization/Surgery 11/1-11/06/2018- sepsis foot infection 11/4-11/5 02 sats low respiratory distress Family  and Social History Cancer: No; Diabetes: No; Hereditary Spherocytosis: No; Hypertension: No; Kidney Disease: No; Lung Disease: No; Seizures: No; Stroke: No; Thyroid Problems: No; Tuberculosis: No; Never smoker; Marital Status - Single; Alcohol Use: Rarely; Drug Use: No History; Caffeine Use: Never; Financial Concerns: No; Food, Clothing or Shelter Needs: No; Support System Lacking: No; Transportation Concerns: No Electronic Signature(s) Signed: 09/16/2021 4:42:19 PM By: [name] MD FACS Entered By: [name] on 09/16/2021  16:32:50 -------------------------------------------------------------------------------- SuperBill Details Patient Name: Date of Service: [name], [name] 09/16/2021 Medical Record Number: [id number] Patient Account Number: [id number] Date of Birth/Sex: Treating RN: 1985-06-15 (36 y.o. 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 Facility Procedures CPT4 Code: [id number] Description: 57017 - DEB SUBQ TISSUE 20 SQ CM/< ICD-10 Diagnosis Description L97.528 Non-pressure chronic ulcer of other part of left foot with other specified seve L97.518 Non-pressure chronic ulcer of other part of right foot with other specified sev Modifier: rity erity Quantity: 1 CPT4 Code: [id number] I Description: 92330 - DEB SUBQ TISS EA ADDL 20CM CD-10 Diagnosis Description L97.528 Non-pressure chronic ulcer of other part of left foot with other specified severity L97.518 Non-pressure chronic ulcer of other part of right foot with other specified  severity Modifier: Quantity: 1 Physician Procedures : CPT4 Code Description Modifier [id number] - WC PHYS LEVEL 4 - EST PT 25 ICD-10 Diagnosis Description L97.528 Non-pressure chronic ulcer of other part of left foot with other specified severity L97.518 Non-pressure chronic ulcer of other part of  right foot with other specified severity 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.621 Type 2 diabetes mellitus with foot ulcer Quantity: 1 : [id number] 37342 -  WC PHYS SUBQ TISS 20 SQ CM ICD-10 Diagnosis Description L97.528 Non-pressure chronic ulcer of other part of left foot with other specified severity L97.518 Non-pressure chronic ulcer of other part of right foot with other specified  severity Quantity: 1 : [id number] 11045 - WC PHYS SUBQ TISS EA ADDL 20 CM ICD-10 Diagnosis Description L97.528 Non-pressure chronic ulcer of other part of left foot with other specified severity L97.518 Non-pressure chronic ulcer of other part of right foot with other specified  severity Quantity: 1 Electronic Signature(s) Signed: 09/16/2021 4:42:05 PM By: [name] MD FACS Entered By: [name] on 09/16/2021 16:42:04

## 2021-09-16 NOTE — Progress Notes (Signed)
[place], [name] ([id number]) Visit Report for 09/16/2021 Arrival Information Details Patient Name: Date of Service: [name] 09/16/2021 3:15 PM Medical Record Number: [id number] Patient Account Number: [id number] Date of Birth/Sex: Treating RN: Feb 03, 1986 (36 y.o. [name] Primary Care [name]: [name] Other Clinician: Referring [name]: Treating [name]/Extender: [name] in Treatment: 54 Visit Information History Since Last Visit Added or deleted any medications: No Patient Arrived: Ambulatory Any new allergies or adverse reactions: No Arrival Time: 15:46 Had Max fall or experienced change in No Accompanied By: alone activities of daily living that may affect Transfer Assistance: None risk of falls: Patient Identification Verified: Yes Signs or symptoms of abuse/neglect since last visito No Secondary Verification Process Completed: Yes Hospitalized since last visit: No Patient Requires Transmission-Based Precautions: No Implantable device outside of the clinic excluding No Patient Has Alerts: No cellular tissue based products placed in the center since last visit: Has Dressing in Place as Prescribed: Yes Pain Present Now: No Electronic Signature(s) Signed: 09/16/2021 5:36:06 PM By: Max Hurst RN, BSN Entered By: [name] on 09/16/2021 15:47:53 -------------------------------------------------------------------------------- Encounter Discharge Information Details Patient Name: Date of Service: [name], [name] 09/16/2021 3:15 PM Medical Record Number: [id number] Patient Account Number: [id number] Date of Birth/Sex: Treating RN: 01-27-1986 (36 y.o. [name] Primary Care Camella Seim: [name] Other Clinician: Referring [name]: Treating [name]/Extender: [name] in Treatment: 39 Encounter Discharge Information Items Post Procedure Vitals Discharge Condition:  Stable Temperature (F): 98.9 Ambulatory Status: Ambulatory Pulse (bpm): 109 Discharge Destination: Home Respiratory Rate (breaths/min): 18 Transportation: Private Auto Blood Pressure (mmHg): 92/64 Accompanied By: self Schedule Follow-up Appointment: Yes Clinical Summary of Care: Patient Declined Electronic Signature(s) Signed: 09/16/2021 5:29:10 PM By: Max Catholic RN Entered By: [name] on 09/16/2021 17:25:19 -------------------------------------------------------------------------------- Lower Extremity Assessment Details Patient Name: Date of Service: [name] 09/16/2021 3:15 PM Medical Record Number: [id number] Patient Account Number: [id number] Date of Birth/Sex: Treating RN: 02-14-1986 (36 y.o. [name] Primary Care [name]: [name] Other Clinician: Referring [name]: Treating [name]/Extender: [name] in Treatment: 99 Edema Assessment Assessed: [Left: No] [Right: No] Edema: [Left: Yes] [Right: Yes] Calf Left: Right: Point of Measurement: 48 cm From Medial Instep 49 cm 49 cm Ankle Left: Right: Point of Measurement: 11 cm From Medial Instep 27 cm 30 cm Electronic Signature(s) Signed: 09/16/2021 5:29:10 PM By: Max Catholic RN Entered By: [name] on 09/16/2021 15:59:02 -------------------------------------------------------------------------------- Multi Wound Chart Details Patient Name: Date of Service: [name], [name] 09/16/2021 3:15 PM Medical Record Number: [id number] Patient Account Number: [id number] Date of Birth/Sex: Treating RN: May 20, 1985 (36 y.o. M) Primary Care [name]: [name] Other Clinician: Referring Taevyn Hausen: Treating Treyson Axel/Extender: [name] in Treatment: 99 Vital Signs Height(in): 77 Capillary Blood Glucose(mg/dl): 107 Weight(lbs): 280 Pulse(bpm): 109 Body Mass Index(BMI): 33.2 Blood Pressure(mmHg):  92/64 Temperature(F): 98.9 Respiratory Rate(breaths/min): 18 Photos: Right, Anterior Foot Left, Lateral, Plantar Foot Right, Lateral Foot Wound Location: Gradually Appeared Trauma Blister Wounding Event: Diabetic Wound/Ulcer of the Lower Diabetic Wound/Ulcer of the Lower Diabetic Wound/Ulcer of the Lower Primary Etiology: Extremity Extremity Extremity Type II Diabetes Type II Diabetes Type II Diabetes Comorbid History: 08/20/2021 10/02/2019 04/18/2021 Date Acquired: 3 99 21 Weeks of Treatment: Open Open Open Wound Status: No No No Wound Recurrence: 0.5x1x0.1 3.5x3.7x0.4 4x3.3x1 Measurements L x W x [name] 0.393 10.171 10.367 Max (cm) : rea 0.039 4.068 10.367 Volume (cm) : -1167.70% -516.80% -  1784.90% % Reduction in Max rea: -1200.00% -2365.50% -9324.50% % Reduction in Volume: 4 3 Starting Position 1 (o'clock): 6 5 Ending Position 1 (o'clock): 0.8 2 Maximum Distance 1 (cm): No Yes Yes Undermining: Grade 2 Grade 2 Grade 2 Classification: Medium Medium Large Exudate Max mount: Serosanguineous Serosanguineous Serosanguineous Exudate Type: red, brown red, brown red, brown Exudate Color: Distinct, outline attached Well defined, not attached Well defined, not attached Wound Margin: Medium (34-66%) Large (67-100%) Large (67-100%) Granulation Max mount: Red, Pink Red, Pink Red, Pink Granulation Quality: Medium (34-66%) Small (1-33%) Small (1-33%) Necrotic Max mount: Eschar, Adherent Slough Adherent [employer] Necrotic Tissue: Fat Layer (Subcutaneous Tissue): Yes Fascia: No Fat Layer (Subcutaneous Tissue): Yes Exposed Structures: Fascia: No Fat Layer (Subcutaneous Tissue): No Fascia: No Tendon: No Tendon: No Tendon: No Muscle: No Muscle: No Muscle: No Joint: No Joint: No Joint: No Bone: No Bone: No Bone: No Small (1-33%) Small (1-33%) Small (1-33%) Epithelialization: N/Max Debridement - Excisional Debridement -  Excisional Debridement: Pre-procedure Verification/Time Out N/Max 16:15 16:15 Taken: N/Max Other Other Pain Control: N/Max Callus, Subcutaneous Callus, Subcutaneous Tissue Debrided: N/Max Skin/Subcutaneous Tissue Skin/Subcutaneous Tissue Level: N/Max 12.95 13.2 Debridement Max (sq cm): rea N/Max Curette Curette Instrument: N/Max Minimum Moderate Bleeding: N/Max Pressure Silver Nitrate Hemostasis Max chieved: N/Max 0 0 Procedural Pain: N/Max 0 0 Post Procedural Pain: N/Max Procedure was tolerated well Procedure was tolerated well Debridement Treatment Response: N/Max 3.5x3.7x0.4 4x3.3x1 Post Debridement Measurements L x W x [name] N/Max 4.068 10.367 Post Debridement Volume: (cm) N/Max Debridement Debridement Procedures Performed: Treatment Notes Electronic Signature(s) Signed: 09/16/2021 4:28:10 PM By: [name] MD FACS Entered By: [name] on 09/16/2021 16:28:09 -------------------------------------------------------------------------------- Multi-Disciplinary Care Plan Details Patient Name: Date of Service: [name], [name] 09/16/2021 3:15 PM Medical Record Number: [id number] Patient Account Number: [id number] Date of Birth/Sex: Treating RN: September 08, 1985 (36 y.o. [name] Primary Care Joby Richart: [name] Other Clinician: Referring Zamyra Allensworth: Treating Amando Chaput/Extender: [name] in Treatment: 99 Multidisciplinary Care Plan reviewed with physician Active Inactive Nutrition Nursing Diagnoses: Imbalanced nutrition Potential for alteratiion in Nutrition/Potential for imbalanced nutrition Goals: Patient/caregiver agrees to and verbalizes understanding of need to use nutritional supplements and/or vitamins as prescribed Date Initiated: 10/24/2019 Date Inactivated: 04/06/2020 Target Resolution Date: 04/03/2020 Goal Status: Met Patient/caregiver will maintain therapeutic glucose control Date Initiated: 10/24/2019 Target Resolution Date:  10/29/2021 Goal Status: Active Interventions: Assess HgA1c results as ordered upon admission and as needed Assess patient nutrition upon admission and as needed per policy Provide education on elevated blood sugars and impact on wound healing Provide education on nutrition Treatment Activities: Education provided on Nutrition : 06/16/2021 Notes: 11/17/20: Glucose control ongoing issue, target date extended. 01/26/21: Glucose management continues. Wound/Skin Impairment Nursing Diagnoses: Impaired tissue integrity Knowledge deficit related to ulceration/compromised skin integrity Goals: Patient/caregiver will verbalize understanding of skin care regimen Date Initiated: 10/24/2019 Target Resolution Date: 10/29/2021 Goal Status: Active Ulcer/skin breakdown will have Max volume reduction of 30% by week 4 Date Initiated: 10/24/2019 Date Inactivated: 01/16/2020 Target Resolution Date: 01/10/2020 Unmet Reason: no change in Goal Status: Unmet measurements. Interventions: Assess patient/caregiver ability to obtain necessary supplies Assess patient/caregiver ability to perform ulcer/skin care regimen upon admission and as needed Assess ulceration(s) every visit Provide education on ulcer and skin care Notes: 11/17/20: Wound care regimen continues Electronic Signature(s) Signed: 09/16/2021 5:29:10 PM By: Max Catholic RN Entered By: [name] on 09/16/2021 17:23:22 -------------------------------------------------------------------------------- Pain Assessment Details Patient Name: Date of Service: [name], Max  [name] 09/16/2021 3:15 PM Medical Record Number: [id number] Patient Account Number: [id number] Date of Birth/Sex: Treating RN: 01-03-1986 (36 y.o. [name] Primary Care Alexy Bringle: [name] Other Clinician: Referring Arieanna Pressey: Treating Kalifa Cadden/Extender: [name] in Treatment: 99 Active Problems Location of Pain Severity and  Description of Pain Patient Has Paino No Site Locations Pain Management and Medication Current Pain Management: Electronic Signature(s) Signed: 09/16/2021 5:36:06 PM By: Max Hurst RN, BSN Entered By: [name] on 09/16/2021 15:48:50 -------------------------------------------------------------------------------- Patient/Caregiver Education Details Patient Name: Date of Service: [name] 6/15/2023andnbsp3:15 PM Medical Record Number: [id number] Patient Account Number: [id number] Date of Birth/Gender: Treating RN: [birth year] (36 y.o. [name] Primary Care Physician: [name] Other Clinician: Referring Physician: Treating Physician/Extender: [name] in Treatment: 62 Education Assessment Education Provided To: Patient Education Topics Provided Wound/Skin Impairment: Methods: Explain/Verbal Responses: Return demonstration correctly Electronic Signature(s) Signed: 09/16/2021 5:29:10 PM By: Max Catholic RN Entered By: [name] on 09/16/2021 17:23:49 -------------------------------------------------------------------------------- Wound Assessment Details Patient Name: Date of Service: [name] 09/16/2021 3:15 PM Medical Record Number: [id number] Patient Account Number: [id number] Date of Birth/Sex: Treating RN: Jun 11, 1985 (36 y.o. [name] Primary Care Tanganika Barradas: [name] Other Clinician: Referring Indea Dearman: Treating Avilyn Virtue/Extender: [name] in Treatment: 99 Wound Status Wound Number: 11 Primary Etiology: Diabetic Wound/Ulcer of the Lower Extremity Wound Location: Right, Anterior Foot Wound Status: Open Wounding Event: Gradually Appeared Comorbid History: Type II Diabetes Date Acquired: 08/20/2021 Weeks Of Treatment: 3 Clustered Wound: No Photos Wound Measurements Length: (cm) 0.5 Width: (cm) 1 Depth: (cm) 0.1 Area: (cm) 0.393 Volume:  (cm) 0.039 % Reduction in Area: -1167.7% % Reduction in Volume: -1200% Epithelialization: Small (1-33%) Tunneling: No Undermining: No Wound Description Classification: Grade 2 Wound Margin: Distinct, outline attached Exudate Amount: Medium Exudate Type: Serosanguineous Exudate Color: red, brown Foul Odor After Cleansing: No Slough/Fibrino Yes Wound Bed Granulation Amount: Medium (34-66%) Exposed Structure Granulation Quality: Red, Pink Fascia Exposed: No Necrotic Amount: Medium (34-66%) Fat Layer (Subcutaneous Tissue) Exposed: Yes Necrotic Quality: Eschar, Adherent Slough Tendon Exposed: No Muscle Exposed: No Joint Exposed: No Bone Exposed: No Treatment Notes Wound #11 (Foot) Wound Laterality: Right, Anterior Cleanser Soap and Water Discharge Instruction: May shower and wash wound with dial antibacterial soap and water prior to dressing change. Byram Ancillary Kit - 15 Day Supply Discharge Instruction: Use supplies as instructed; Kit contains: (15) Saline Bullets; (15) 3x3 Gauze; 15 pr Gloves Peri-Wound Care Topical keystone antibiotic compound Primary Dressing KerraCel Ag Gelling Fiber Dressing, 4x5 in (silver alginate) Discharge Instruction: Apply silver alginate to wound bed as instructed Secondary Dressing ABD Pad, 8x10 Discharge Instruction: Apply over primary dressing as directed. Optifoam Non-Adhesive Dressing, 4x4 in Discharge Instruction: Apply over primary dressing as directed. Zetuvit Plus 4x8 in Discharge Instruction: Apply over primary dressing as directed. NonWoven Sponge, 4x4 (in/in) Secured With Coban Self-Adherent Wrap 4x5 (in/yd) Discharge Instruction: Secure with Coban as directed. Kerlix Roll Sterile, 4.5x3.1 (in/yd) Discharge Instruction: Secure with Kerlix as directed. 8M Medipore H Soft Cloth Surgical T ape, 4 x 10 (in/yd) Discharge Instruction: Secure with tape as directed. Compression Wrap Compression Stockings Add-Ons Electronic  Signature(s) Signed: 09/16/2021 5:29:10 PM By: Max Catholic RN Entered By: [name] on 09/16/2021 16:08:23 -------------------------------------------------------------------------------- Wound Assessment Details Patient Name: Date of Service: [name] 09/16/2021 3:15 PM Medical Record Number: [id number] Patient Account Number: [id number] Date of Birth/Sex: Treating RN: February 14, 1986 (37 y.o. [name] Primary  Care Jahdiel Krol: [name] Other Clinician: Referring Loretto Belinsky: Treating Offie Pickron/Extender: [name] in Treatment: 99 Wound Status Wound Number: 3 Primary Etiology: Diabetic Wound/Ulcer of the Lower Extremity Wound Location: Left, Lateral, Plantar Foot Wound Status: Open Wounding Event: Trauma Comorbid History: Type II Diabetes Date Acquired: 10/02/2019 Weeks Of Treatment: 99 Clustered Wound: No Photos Wound Measurements Length: (cm) 3.5 Width: (cm) 3.7 Depth: (cm) 0.4 Area: (cm) 10.171 Volume: (cm) 4.068 % Reduction in Area: -516.8% % Reduction in Volume: -2365.5% Epithelialization: Small (1-33%) Undermining: Yes Starting Position (o'clock): 4 Ending Position (o'clock): 6 Maximum Distance: (cm) 0.8 Wound Description Classification: Grade 2 Wound Margin: Well defined, not attached Exudate Amount: Medium Exudate Type: Serosanguineous Exudate Color: red, brown Foul Odor After Cleansing: No Slough/Fibrino Yes Wound Bed Granulation Amount: Large (67-100%) Exposed Structure Granulation Quality: Red, Pink Fascia Exposed: No Necrotic Amount: Small (1-33%) Fat Layer (Subcutaneous Tissue) Exposed: No Necrotic Quality: Adherent Slough Tendon Exposed: No Muscle Exposed: No Joint Exposed: No Bone Exposed: No Treatment Notes Wound #3 (Foot) Wound Laterality: Plantar, Left, Lateral Cleanser Soap and Water Discharge Instruction: May shower and wash wound with dial antibacterial soap and water prior to  dressing change. Byram Ancillary Kit - 15 Day Supply Discharge Instruction: Use supplies as instructed; Kit contains: (15) Saline Bullets; (15) 3x3 Gauze; 15 pr Gloves Peri-Wound Care Topical keystone antibiotic compound Primary Dressing KerraCel Ag Gelling Fiber Dressing, 4x5 in (silver alginate) Discharge Instruction: Apply silver alginate to wound bed as instructed Secondary Dressing ABD Pad, 8x10 Discharge Instruction: Apply over primary dressing as directed. Optifoam Non-Adhesive Dressing, 4x4 in Discharge Instruction: Apply over primary dressing as directed. Zetuvit Plus 4x8 in Discharge Instruction: Apply over primary dressing as directed. NonWoven Sponge, 4x4 (in/in) Secured With Coban Self-Adherent Wrap 4x5 (in/yd) Discharge Instruction: Secure with Coban as directed. Kerlix Roll Sterile, 4.5x3.1 (in/yd) Discharge Instruction: Secure with Kerlix as directed. 23M Medipore H Soft Cloth Surgical T ape, 4 x 10 (in/yd) Discharge Instruction: Secure with tape as directed. Compression Wrap Compression Stockings Add-Ons Electronic Signature(s) Signed: 09/16/2021 5:29:10 PM By: Max Catholic RN Entered By: [name] on 09/16/2021 16:09:15 -------------------------------------------------------------------------------- Wound Assessment Details Patient Name: Date of Service: [name] 09/16/2021 3:15 PM Medical Record Number: [id number] Patient Account Number: [id number] Date of Birth/Sex: Treating RN: 1985-05-31 (36 y.o. [name] Primary Care Kaytee Taliercio: [name] Other Clinician: Referring Keeli Roberg: Treating Demontray Franta/Extender: [name] in Treatment: 99 Wound Status Wound Number: 8 Primary Etiology: Diabetic Wound/Ulcer of the Lower Extremity Wound Location: Right, Lateral Foot Wound Status: Open Wounding Event: Blister Comorbid History: Type II Diabetes Date Acquired: 04/18/2021 Weeks Of Treatment: 21 Clustered  Wound: No Photos Wound Measurements Length: (cm) 4 Width: (cm) 3.3 Depth: (cm) 1 Area: (cm) 10.367 Volume: (cm) 10.367 % Reduction in Area: -1784.9% % Reduction in Volume: -9324.5% Epithelialization: Small (1-33%) Undermining: Yes Starting Position (o'clock): 3 Ending Position (o'clock): 5 Maximum Distance: (cm) 2 Wound Description Classification: Grade 2 Wound Margin: Well defined, not attached Exudate Amount: Large Exudate Type: Serosanguineous Exudate Color: red, brown Foul Odor After Cleansing: No Slough/Fibrino Yes Wound Bed Granulation Amount: Large (67-100%) Exposed Structure Granulation Quality: Red, Pink Fascia Exposed: No Necrotic Amount: Small (1-33%) Fat Layer (Subcutaneous Tissue) Exposed: Yes Necrotic Quality: Adherent Slough Tendon Exposed: No Muscle Exposed: No Joint Exposed: No Bone Exposed: No Treatment Notes Wound #8 (Foot) Wound Laterality: Right, Lateral Cleanser Soap and Water Discharge Instruction: May shower and wash wound with dial antibacterial soap and water prior  to dressing change. Byram Ancillary Kit - 15 Day Supply Discharge Instruction: Use supplies as instructed; Kit contains: (15) Saline Bullets; (15) 3x3 Gauze; 15 pr Gloves Peri-Wound Care Topical keystone antibiotic compound Primary Dressing KerraCel Ag Gelling Fiber Dressing, 4x5 in (silver alginate) Discharge Instruction: Apply silver alginate to wound bed as instructed Secondary Dressing ABD Pad, 8x10 Discharge Instruction: Apply over primary dressing as directed. Optifoam Non-Adhesive Dressing, 4x4 in Discharge Instruction: Apply over primary dressing as directed. Zetuvit Plus 4x8 in Discharge Instruction: Apply over primary dressing as directed. NonWoven Sponge, 4x4 (in/in) Secured With Coban Self-Adherent Wrap 4x5 (in/yd) Discharge Instruction: Secure with Coban as directed. Kerlix Roll Sterile, 4.5x3.1 (in/yd) Discharge Instruction: Secure with Kerlix as  directed. 25M Medipore H Soft Cloth Surgical T ape, 4 x 10 (in/yd) Discharge Instruction: Secure with tape as directed. Compression Wrap Compression Stockings Add-Ons Electronic Signature(s) Signed: 09/16/2021 5:29:10 PM By: Max Catholic RN Entered By: [name] on 09/16/2021 16:27:57 -------------------------------------------------------------------------------- Vitals Details Patient Name: Date of Service: [name], [name] 09/16/2021 3:15 PM Medical Record Number: [id number] Patient Account Number: [id number] Date of Birth/Sex: Treating RN: 06-02-1985 (36 y.o. [name] Primary Care Drinda Belgard: [name] Other Clinician: Referring Corianna Avallone: Treating Adryan Druckenmiller/Extender: [name] in Treatment: 99 Vital Signs Time Taken: 15:48 Temperature (F): 98.9 Height (in): 77 Pulse (bpm): 109 Weight (lbs): 280 Respiratory Rate (breaths/min): 18 Body Mass Index (BMI): 33.2 Blood Pressure (mmHg): 92/64 Capillary Blood Glucose (mg/dl): 107 Reference Range: 80 - 120 mg / dl Notes glucose per pt report this AM Electronic Signature(s) Signed: 09/16/2021 5:36:06 PM By: Max Hurst RN, BSN Entered By: [name] on 09/16/2021 15:50:00

## 2021-09-17 LAB — CULTURE, BLOOD (ROUTINE X 2)
Culture: NO GROWTH
Culture: NO GROWTH
Special Requests: ADEQUATE
Special Requests: ADEQUATE

## 2021-09-24 ENCOUNTER — Encounter (HOSPITAL_BASED_OUTPATIENT_CLINIC_OR_DEPARTMENT_OTHER): Payer: BC Managed Care – PPO | Admitting: General Surgery

## 2021-09-24 DIAGNOSIS — E11621 Type 2 diabetes mellitus with foot ulcer: Secondary | ICD-10-CM | POA: Diagnosis not present

## 2021-09-29 ENCOUNTER — Encounter (HOSPITAL_BASED_OUTPATIENT_CLINIC_OR_DEPARTMENT_OTHER): Payer: BC Managed Care – PPO | Admitting: General Surgery

## 2021-09-29 DIAGNOSIS — E11621 Type 2 diabetes mellitus with foot ulcer: Secondary | ICD-10-CM | POA: Diagnosis not present

## 2021-09-30 ENCOUNTER — Other Ambulatory Visit: Payer: Self-pay | Admitting: General Surgery

## 2021-09-30 ENCOUNTER — Other Ambulatory Visit (HOSPITAL_COMMUNITY): Payer: Self-pay | Admitting: General Surgery

## 2021-09-30 DIAGNOSIS — L97518 Non-pressure chronic ulcer of other part of right foot with other specified severity: Secondary | ICD-10-CM

## 2021-10-01 NOTE — Progress Notes (Signed)
[name] ([id number]) Visit Report for 09/29/2021 Arrival Information Details Patient Name: Date of Service: [name] 09/29/2021 1:15 PM Medical Record Number: [id number] Patient Account Number: [id number] Date of Birth/Sex: Treating RN: 1985-07-24 (36 y.o. [name] Primary Care [name]: [name] Other Clinician: Referring [name]: Treating [name]/Extender: [name] in Treatment: 100 Visit Information History Since Last Visit Added or deleted any medications: No Patient Arrived: Ambulatory Any new allergies or adverse reactions: No Arrival Time: 13:49 Had a fall or experienced change in No Accompanied By: self activities of daily living that may affect Transfer Assistance: None risk of falls: Patient Identification Verified: Yes Signs or symptoms of abuse/neglect since last visito No Secondary Verification Process Completed: Yes Hospitalized since last visit: No Patient Requires Transmission-Based Precautions: No Implantable device outside of the clinic excluding No Patient Has Alerts: No cellular tissue based products placed in the center since last visit: Has Dressing in Place as Prescribed: Yes Has Compression in Place as Prescribed: Yes Pain Present Now: No Electronic Signature(s) Signed: 09/29/2021 5:16:00 PM By: Max Gouty RN, BSN Entered By: [name] on 09/29/2021 13:50:20 -------------------------------------------------------------------------------- Lower Extremity Assessment Details Patient Name: Date of Service: [name] 09/29/2021 1:15 PM Medical Record Number: [id number] Patient Account Number: [id number] Date of Birth/Sex: Treating RN: 10-27-1985 (36 y.o. [name] Primary Care [name]: [name] Other Clinician: Referring [place]ale Marney: Treating [name]/Extender: [name] in Treatment: 100 Edema Assessment Assessed: [Left: No]  [Right: No] Edema: [Left: Yes] [Right: Yes] Calf Left: Right: Point of Measurement: 48 cm From Medial Instep 46.5 cm 50.5 cm Ankle Left: Right: Point of Measurement: 11 cm From Medial Instep 27.3 cm 33 cm Vascular Assessment Pulses: Dorsalis Pedis Palpable: [Left:Yes] [Right:Yes] Electronic Signature(s) Signed: 09/29/2021 5:16:00 PM By: Max Gouty RN, BSN Entered By: [name] on 09/29/2021 14:02:31 -------------------------------------------------------------------------------- Multi Wound Chart Details Patient Name: Date of Service: [name] 09/29/2021 1:15 PM Medical Record Number: [id number] Patient Account Number: [id number] Date of Birth/Sex: Treating RN: 19-Dec-1985 (36 y.o. [name] Primary Care Yader Criger: [name] Other Clinician: Referring Shawntel Farnworth: Treating [name]/Extender: [name] in Treatment: 100 Vital Signs Height(in): 77 Capillary Blood Glucose(mg/dl): 120 Weight(lbs): 280 Pulse(bpm): 102 Body Mass Index(BMI): 33.2 Blood Pressure(mmHg): 123/80 Temperature(F): 98.4 Respiratory Rate(breaths/min): 18 Photos: Right, Anterior Foot Left, Dorsal Foot Left, Lateral, Plantar Foot Wound Location: Gradually Appeared Gradually Appeared Trauma Wounding Event: Diabetic Wound/Ulcer of the Lower Diabetic Wound/Ulcer of the Lower Diabetic Wound/Ulcer of the Lower Primary Etiology: Extremity Extremity Extremity Type II Diabetes Type II Diabetes Type II Diabetes Comorbid History: 08/20/2021 09/29/2021 10/02/2019 Date Acquired: 5 0 100 Weeks of Treatment: Open Open Open Wound Status: No No No Wound Recurrence: 3.5x2.7x0.1 1.5x0.8x0.1 3.3x3.5x0.2 Measurements L x W x [name] 7.422 0.942 9.071 A (cm) : rea 0.742 0.094 1.814 Volume (cm) : -23841.90% 0.00% -450.10% % Reduction in A rea: -24633.30% 0.00% -999.40% % Reduction in Volume: No No No Undermining: Grade 2 Grade 2 Grade  2 Classification: Medium Medium Medium Exudate A mount: Serosanguineous Serosanguineous Serosanguineous Exudate Type: red, brown red, brown red, brown Exudate Color: Distinct, outline attached Distinct, outline attached Well defined, not attached Wound Margin: Small (1-33%) Small (1-33%) Large (67-100%) Granulation A mount: Red, Pink Red Pink Granulation Quality: Large (67-100%) Large (67-100%) Small (1-33%) Necrotic A mount: Eschar, Adherent Slough Eschar, [place] Necrotic Tissue: Fat Layer (Subcutaneous Tissue): Yes Fat Layer (Subcutaneous Tissue): Yes Fat  Layer (Subcutaneous Tissue): Yes Exposed Structures: Fascia: No Fascia: No Fascia: No Tendon: No Tendon: No Tendon: No Muscle: No Muscle: No Muscle: No Joint: No Joint: No Joint: No Bone: No Bone: No Bone: No Small (1-33%) Small (1-33%) Small (1-33%) Epithelialization: Debridement - Selective/Open Wound Debridement - Selective/Open Wound Debridement - Selective/Open Wound Debridement: Pre-procedure Verification/Time Out 14:10 14:10 14:10 Taken: N/A Lidocaine 5% topical ointment N/A Pain Control: Necrotic/Eschar, Psychologist, prison and probation services, [employer], [employer] Tissue Debrided: Non-Viable Tissue Non-Viable Tissue Skin/Epidermis Level: 1 1.2 11.55 Debridement A (sq cm): rea Curette Curette Curette Instrument: Minimum Minimum Minimum Bleeding: Pressure Pressure Pressure Hemostasis Achieved: 0 0 0 Procedural Pain: 0 0 0 Post Procedural Pain: Procedure was tolerated well Procedure was tolerated well Procedure was tolerated well Debridement Treatment Response: 3.5x2.7x0.1 1.5x0.8x0.1 3.3x3.5x0.2 Post Debridement Measurements L x W x [name] 0.742 0.094 1.814 Post Debridement Volume: (cm) Debridement Debridement Debridement Procedures Performed: Wound Number: 8 N/A N/A Photos: N/A N/A Right, Lateral Foot N/A N/A Wound Location: Blister N/A N/A Wounding Event: Diabetic  Wound/Ulcer of the Lower N/A N/A Primary Etiology: Extremity Type II Diabetes N/A N/A Comorbid History: 04/18/2021 N/A N/A Date Acquired: 5 N/A N/A Weeks of Treatment: Open N/A N/A Wound Status: No N/A N/A Wound Recurrence: 4.5x3.7x1.6 N/A N/A Measurements L x W x [name] 13.077 N/A N/A A (cm) : rea 20.923 N/A N/A Volume (cm) : -2277.60% N/A N/A % Reduction in A rea: -18920.90% N/A N/A % Reduction in Volume: 12 Starting Position 1 (o'clock): 2 Ending Position 1 (o'clock): 2.2 Maximum Distance 1 (cm): Yes N/A N/A Undermining: Grade 2 N/A N/A Classification: Large N/A N/A Exudate A mount: Serosanguineous N/A N/A Exudate Type: red, brown N/A N/A Exudate Color: Well defined, not attached N/A N/A Wound Margin: Large (67-100%) N/A N/A Granulation A mount: Red, Pink N/A N/A Granulation Quality: Small (1-33%) N/A N/A Necrotic A mount: Adherent Slough N/A N/A Necrotic Tissue: Fat Layer (Subcutaneous Tissue): Yes N/A N/A Exposed Structures: Fascia: No Tendon: No Muscle: No Joint: No Bone: No Small (1-33%) N/A N/A Epithelialization: Debridement - Excisional N/A N/A Debridement: Pre-procedure Verification/Time Out 14:10 N/A N/A Taken: Lidocaine 5% topical ointment N/A N/A Pain Control: Necrotic/Eschar, Subcutaneous, N/A N/A Tissue Debrided: Slough Skin/Subcutaneous Tissue N/A N/A Level: 16.65 N/A N/A Debridement A (sq cm): rea Curette N/A N/A Instrument: Minimum N/A N/A Bleeding: Pressure N/A N/A Hemostasis Achieved: 0 N/A N/A Procedural Pain: 0 N/A N/A Post Procedural Pain: Debridement Treatment Response: Procedure was tolerated well N/A N/A Post Debridement Measurements L x 4.5x3.7x1.6 N/A N/A W x [name] 20.923 N/A N/A Post Debridement Volume: (cm) Debridement N/A N/A Procedures Performed: Treatment Notes Electronic Signature(s) Signed: 09/29/2021 2:26:41 PM By: [name] MD FACS Signed: 09/29/2021 5:16:00 PM By: Max Gouty RN, BSN Entered By: [name] on 09/29/2021 14:26:40 -------------------------------------------------------------------------------- Multi-Disciplinary Care Plan Details Patient Name: Date of Service: [name] 09/29/2021 1:15 PM Medical Record Number: [id number] Patient Account Number: [id number] Date of Birth/Sex: Treating RN: 1985-09-21 (36 y.o. [name] Primary Care Quintavius Niebuhr: [name] Other Clinician: Referring Adri Schloss: Treating Tiarrah Saville/Extender: [name] in Treatment: 100 Multidisciplinary Care Plan reviewed with physician Active Inactive Nutrition Nursing Diagnoses: Imbalanced nutrition Potential for alteratiion in Nutrition/Potential for imbalanced nutrition Goals: Patient/caregiver agrees to and verbalizes understanding of need to use nutritional supplements and/or vitamins as prescribed Date Initiated: 10/24/2019 Date Inactivated: 04/06/2020 Target Resolution Date: 04/03/2020 Goal Status: Met Patient/caregiver will maintain therapeutic glucose control Date Initiated: 10/24/2019 Target Resolution Date: 10/29/2021  Goal Status: Active Interventions: Assess HgA1c results as ordered upon admission and as needed Assess patient nutrition upon admission and as needed per policy Provide education on elevated blood sugars and impact on wound healing Provide education on nutrition Treatment Activities: Education provided on Nutrition : 06/16/2021 Notes: 11/17/20: Glucose control ongoing issue, target date extended. 01/26/21: Glucose management continues. Wound/Skin Impairment Nursing Diagnoses: Impaired tissue integrity Knowledge deficit related to ulceration/compromised skin integrity Goals: Patient/caregiver will verbalize understanding of skin care regimen Date Initiated: 10/24/2019 Target Resolution Date: 10/29/2021 Goal Status: Active Ulcer/skin breakdown will have a volume reduction of 30% by week 4 Date  Initiated: 10/24/2019 Date Inactivated: 01/16/2020 Target Resolution Date: 01/10/2020 Unmet Reason: no change in Goal Status: Unmet measurements. Interventions: Assess patient/caregiver ability to obtain necessary supplies Assess patient/caregiver ability to perform ulcer/skin care regimen upon admission and as needed Assess ulceration(s) every visit Provide education on ulcer and skin care Notes: 11/17/20: Wound care regimen continues Electronic Signature(s) Signed: 09/29/2021 5:16:00 PM By: Max Gouty RN, BSN Entered By: [name] on 09/29/2021 14:09:12 -------------------------------------------------------------------------------- Pain Assessment Details Patient Name: Date of Service: [name] 09/29/2021 1:15 PM Medical Record Number: [id number] Patient Account Number: [id number] Date of Birth/Sex: Treating RN: Jan 12, 1986 (36 y.o. [name] Primary Care Shanavia Makela: [name] Other Clinician: Referring Gaylia Kassel: Treating Sina Lucchesi/Extender: [name] in Treatment: 100 Active Problems Location of Pain Severity and Description of Pain Patient Has Paino No Site Locations Rate the pain. Current Pain Level: 0 Pain Management and Medication Current Pain Management: Electronic Signature(s) Signed: 09/29/2021 5:16:00 PM By: Max Gouty RN, BSN Entered By: [name] on 09/29/2021 13:51:39 -------------------------------------------------------------------------------- Patient/Caregiver Education Details Patient Name: Date of Service: [name] 6/28/2023andnbsp1:15 PM Medical Record Number: [id number] Patient Account Number: [id number] Date of Birth/Gender: Treating RN: 1985/12/10 (36 y.o. [name] Primary Care Physician: [name] Other Clinician: Referring Physician: Treating Physician/Extender: [name] in Treatment: 100 Education Assessment Education  Provided To: Patient Education Topics Provided Elevated Blood Sugar/ Impact on Healing: Methods: Explain/Verbal Responses: Reinforcements needed, State content correctly Offloading: Methods: Explain/Verbal Responses: Reinforcements needed, State content correctly Venous: Methods: Explain/Verbal Responses: Reinforcements needed, State content correctly Wound/Skin Impairment: Methods: Explain/Verbal Responses: Reinforcements needed, State content correctly Electronic Signature(s) Signed: 09/29/2021 5:16:00 PM By: Max Gouty RN, BSN Entered By: [name] on 09/29/2021 14:09:56 -------------------------------------------------------------------------------- Wound Assessment Details Patient Name: Date of Service: [name] 09/29/2021 1:15 PM Medical Record Number: [id number] Patient Account Number: [id number] Date of Birth/Sex: Treating RN: 1986-01-20 (36 y.o. [name] Primary Care Zadyn Yardley: [name] Other Clinician: Referring Kayliegh Boyers: Treating Inita Uram/Extender: [name] in Treatment: 100 Wound Status Wound Number: 11 Primary Etiology: Diabetic Wound/Ulcer of the Lower Extremity Wound Location: Right, Anterior Foot Wound Status: Open Wounding Event: Gradually Appeared Comorbid History: Type II Diabetes Date Acquired: 08/20/2021 Weeks Of Treatment: 5 Clustered Wound: No Photos Wound Measurements Length: (cm) 3.5 Width: (cm) 2.7 Depth: (cm) 0.1 Area: (cm) 7.422 Volume: (cm) 0.742 % Reduction in Area: -23841.9% % Reduction in Volume: -24633.3% Epithelialization: Small (1-33%) Tunneling: No Undermining: No Wound Description Classification: Grade 2 Wound Margin: Distinct, outline attached Exudate Amount: Medium Exudate Type: Serosanguineous Exudate Color: red, brown Foul Odor After Cleansing: No Slough/Fibrino Yes Wound Bed Granulation Amount: Small (1-33%) Exposed Structure Granulation Quality:  Red, Pink Fascia Exposed: No Necrotic Amount: Large (67-100%) Fat Layer (Subcutaneous Tissue) Exposed: Yes Necrotic Quality: Eschar, Adherent Slough Tendon Exposed: No Muscle  Exposed: No Joint Exposed: No Bone Exposed: No Electronic Signature(s) Signed: 09/29/2021 5:16:00 PM By: Max Gouty RN, BSN Signed: 10/01/2021 11:23:28 AM By: Sandre Kitty Entered By: Sandre Kitty on 09/29/2021 14:07:34 -------------------------------------------------------------------------------- Wound Assessment Details Patient Name: Date of Service: [name] 09/29/2021 1:15 PM Medical Record Number: [id number] Patient Account Number: [id number] Date of Birth/Sex: Treating RN: 07-01-1985 (36 y.o. [name] Primary Care Aiko Belko: [name] Other Clinician: Referring Kindle Strohmeier: Treating Dickson Kostelnik/Extender: [name] in Treatment: 100 Wound Status Wound Number: 12 Primary Etiology: Diabetic Wound/Ulcer of the Lower Extremity Wound Location: Left, Dorsal Foot Wound Status: Open Wounding Event: Gradually Appeared Comorbid History: Type II Diabetes Date Acquired: 09/29/2021 Weeks Of Treatment: 0 Clustered Wound: No Photos Wound Measurements Length: (cm) 1.5 Width: (cm) 0.8 Depth: (cm) 0.1 Area: (cm) 0.942 Volume: (cm) 0.094 % Reduction in Area: 0% % Reduction in Volume: 0% Epithelialization: Small (1-33%) Tunneling: No Undermining: No Wound Description Classification: Grade 2 Wound Margin: Distinct, outline attached Exudate Amount: Medium Exudate Type: Serosanguineous Exudate Color: red, brown Foul Odor After Cleansing: No Slough/Fibrino Yes Wound Bed Granulation Amount: Small (1-33%) Exposed Structure Granulation Quality: Red Fascia Exposed: No Necrotic Amount: Large (67-100%) Fat Layer (Subcutaneous Tissue) Exposed: Yes Necrotic Quality: Eschar, Adherent Slough Tendon Exposed: No Muscle Exposed: No Joint Exposed:  No Bone Exposed: No Electronic Signature(s) Signed: 09/29/2021 5:16:00 PM By: Max Gouty RN, BSN Signed: 10/01/2021 11:23:28 AM By: Sandre Kitty Entered By: Sandre Kitty on 09/29/2021 14:09:55 -------------------------------------------------------------------------------- Wound Assessment Details Patient Name: Date of Service: [name] 09/29/2021 1:15 PM Medical Record Number: [id number] Patient Account Number: [id number] Date of Birth/Sex: Treating RN: [birth year] (36 y.o. [name] Primary Care Kerrington Greenhalgh: [name] Other Clinician: Referring [place] Stenberg: Treating Shanelle Clontz/Extender: [name] in Treatment: 100 Wound Status Wound Number: 3 Primary Etiology: Diabetic Wound/Ulcer of the Lower Extremity Wound Location: Left, Lateral, Plantar Foot Wound Status: Open Wounding Event: Trauma Comorbid History: Type II Diabetes Date Acquired: 10/02/2019 Weeks Of Treatment: 100 Clustered Wound: No Photos Wound Measurements Length: (cm) 3.3 Width: (cm) 3.5 Depth: (cm) 0.2 Area: (cm) 9.071 Volume: (cm) 1.814 % Reduction in Area: -450.1% % Reduction in Volume: -999.4% Epithelialization: Small (1-33%) Tunneling: No Undermining: No Wound Description Classification: Grade 2 Wound Margin: Well defined, not attached Exudate Amount: Medium Exudate Type: Serosanguineous Exudate Color: red, brown Foul Odor After Cleansing: No Slough/Fibrino Yes Wound Bed Granulation Amount: Large (67-100%) Exposed Structure Granulation Quality: Pink Fascia Exposed: No Necrotic Amount: Small (1-33%) Fat Layer (Subcutaneous Tissue) Exposed: Yes Necrotic Quality: Adherent Slough Tendon Exposed: No Muscle Exposed: No Joint Exposed: No Bone Exposed: No Electronic Signature(s) Signed: 09/29/2021 5:16:00 PM By: Max Gouty RN, BSN Signed: 10/01/2021 11:23:28 AM By: Sandre Kitty Entered By: Sandre Kitty on 09/29/2021  14:08:54 -------------------------------------------------------------------------------- Wound Assessment Details Patient Name: Date of Service: [name] 09/29/2021 1:15 PM Medical Record Number: [id number] Patient Account Number: [id number] Date of Birth/Sex: Treating RN: [birth year] (36 y.o. [name] Primary Care Lehman Whiteley: [name] Other Clinician: Referring Rondrick Barreira: Treating Johanna Matto/Extender: [name] in Treatment: 100 Wound Status Wound Number: 8 Primary Etiology: Diabetic Wound/Ulcer of the Lower Extremity Wound Location: Right, Lateral Foot Wound Status: Open Wounding Event: Blister Comorbid History: Type II Diabetes Date Acquired: 04/18/2021 Weeks Of Treatment: 23 Clustered Wound: No Photos Wound Measurements Length: (cm) 4.5 Width: (cm) 3.7 Depth: (cm) 1.6 Area: (cm) 13.077 Volume: (cm) 20.923 % Reduction in Area: -2277.6% % Reduction  in Volume: -18920.9% Epithelialization: Small (1-33%) Tunneling: No Undermining: Yes Starting Position (o'clock): 12 Ending Position (o'clock): 2 Maximum Distance: (cm) 2.2 Wound Description Classification: Grade 2 Wound Margin: Well defined, not attached Exudate Amount: Large Exudate Type: Serosanguineous Exudate Color: red, brown Foul Odor After Cleansing: No Slough/Fibrino Yes Wound Bed Granulation Amount: Large (67-100%) Exposed Structure Granulation Quality: Red, Pink Fascia Exposed: No Necrotic Amount: Small (1-33%) Fat Layer (Subcutaneous Tissue) Exposed: Yes Necrotic Quality: Adherent Slough Tendon Exposed: No Muscle Exposed: No Joint Exposed: No Bone Exposed: No Electronic Signature(s) Signed: 09/29/2021 5:16:00 PM By: Max Gouty RN, BSN Signed: 10/01/2021 11:23:28 AM By: Sandre Kitty Entered By: Sandre Kitty on 09/29/2021 14:08:01 -------------------------------------------------------------------------------- Vitals Details Patient  Name: Date of Service: [name] 09/29/2021 1:15 PM Medical Record Number: [id number] Patient Account Number: [id number] Date of Birth/Sex: Treating RN: 04-Dec-1985 (36 y.o. [name] Primary Care Demetry Bendickson: [name] Other Clinician: Referring [place]: Treating Daryn Pisani/Extender: [name] in Treatment: 100 Vital Signs Time Taken: 13:50 Temperature (F): 98.4 Height (in): 77 Pulse (bpm): 102 Weight (lbs): 280 Respiratory Rate (breaths/min): 18 Body Mass Index (BMI): 33.2 Blood Pressure (mmHg): 123/80 Capillary Blood Glucose (mg/dl): 120 Reference Range: 80 - 120 mg / dl Notes glucose per pt report this am Electronic Signature(s) Signed: 09/29/2021 5:16:00 PM By: Max Gouty RN, BSN Entered By: [name] on 09/29/2021 13:51:31

## 2021-10-06 ENCOUNTER — Ambulatory Visit (HOSPITAL_COMMUNITY): Payer: BC Managed Care – PPO

## 2021-10-06 ENCOUNTER — Encounter (HOSPITAL_COMMUNITY): Payer: Self-pay

## 2021-10-08 ENCOUNTER — Encounter (HOSPITAL_BASED_OUTPATIENT_CLINIC_OR_DEPARTMENT_OTHER): Payer: BC Managed Care – PPO | Attending: General Surgery | Admitting: General Surgery

## 2021-10-08 DIAGNOSIS — L97522 Non-pressure chronic ulcer of other part of left foot with fat layer exposed: Secondary | ICD-10-CM | POA: Insufficient documentation

## 2021-10-08 DIAGNOSIS — L97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: Secondary | ICD-10-CM | POA: Diagnosis not present

## 2021-10-08 DIAGNOSIS — E1151 Type 2 diabetes mellitus with diabetic peripheral angiopathy without gangrene: Secondary | ICD-10-CM | POA: Insufficient documentation

## 2021-10-08 DIAGNOSIS — M659 Synovitis and tenosynovitis, unspecified: Secondary | ICD-10-CM | POA: Diagnosis not present

## 2021-10-08 DIAGNOSIS — L97512 Non-pressure chronic ulcer of other part of right foot with fat layer exposed: Secondary | ICD-10-CM | POA: Diagnosis not present

## 2021-10-08 DIAGNOSIS — L97322 Non-pressure chronic ulcer of left ankle with fat layer exposed: Secondary | ICD-10-CM | POA: Insufficient documentation

## 2021-10-08 DIAGNOSIS — L97812 Non-pressure chronic ulcer of other part of right lower leg with fat layer exposed: Secondary | ICD-10-CM | POA: Insufficient documentation

## 2021-10-08 DIAGNOSIS — I890 Lymphedema, not elsewhere classified: Secondary | ICD-10-CM | POA: Diagnosis not present

## 2021-10-08 DIAGNOSIS — E11621 Type 2 diabetes mellitus with foot ulcer: Secondary | ICD-10-CM | POA: Insufficient documentation

## 2021-10-08 DIAGNOSIS — L97528 Non-pressure chronic ulcer of other part of left foot with other specified severity: Secondary | ICD-10-CM | POA: Insufficient documentation

## 2021-10-08 DIAGNOSIS — L97422 Non-pressure chronic ulcer of left heel and midfoot with fat layer exposed: Secondary | ICD-10-CM | POA: Diagnosis not present

## 2021-10-11 NOTE — Progress Notes (Signed)
[place], [name] ([id number]) Visit Report for 10/08/2021 Chief Complaint Document Details Patient Name: Date of Service: [name] 10/08/2021 2:45 PM Medical Record Number: [id number] Patient Account Number: [id number] Date of Birth/Sex: Treating RN: [birth year] (35 y.o. [name] Primary Care Provider: [name] Other Clinician: Referring Provider: Treating Provider/Extender: [name] in Treatment: 65 Information Obtained from: Patient Chief Complaint 01/11/2019; patient is here for review of a rather substantial wound over the left fifth plantar metatarsal head extending into the lateral part of his foot 10/24/2019; patient returns to clinic with wounds on his bilateral feet with underlying osteomyelitis biopsy-proven Electronic Signature(s) Signed: 10/08/2021 3:45:44 PM By: [name] MD FACS Entered By: [name] on 10/08/2021 15:45:43 -------------------------------------------------------------------------------- Debridement Details Patient Name: Date of Service: [name], [name] 10/08/2021 2:45 PM Medical Record Number: [id number] Patient Account Number: [id number] Date of Birth/Sex: Treating RN: 02/24/1986 (36 y.o. [name], [name] Primary Care Provider: [name] Other Clinician: Referring Provider: Treating Provider/Extender: [name] in Treatment: 102 Debridement Performed for Assessment: Wound #11 [place] Performed By: Physician [name], MD Debridement Type: Debridement Severity of Tissue Pre Debridement: Fat layer exposed Level of Consciousness (Pre-procedure): Awake and Alert Pre-procedure Verification/Time Out Yes - 15:30 Taken: Start Time: 15:30 Pain Control: Lidocaine T Area Debrided (L x W): otal 3 (cm) x 2.5 (cm) = 7.5 (cm) Tissue and other material debrided: Viable, Non-Viable, Eschar, Slough, Subcutaneous, Slough Level: Skin/Subcutaneous  Tissue Debridement Description: Excisional Instrument: Curette Bleeding: Minimum Hemostasis Achieved: Pressure End Time: 15:30 Procedural Pain: 0 Post Procedural Pain: 0 Response to Treatment: Procedure was tolerated well Level of Consciousness (Post- Awake and Alert procedure): Post Debridement Measurements of Total Wound Length: (cm) 3 Width: (cm) 2.5 Depth: (cm) 0.1 Volume: (cm) 0.589 Character of Wound/Ulcer Post Debridement: Improved Severity of Tissue Post Debridement: Fat layer exposed Post Procedure Diagnosis Same as Pre-procedure Electronic Signature(s) Signed: 10/08/2021 4:33:43 PM By: [name] MD FACS Signed: 10/11/2021 4:44:20 PM By: Rhae Hammock RN Entered By: Rhae Hammock on 10/08/2021 15:32:32 -------------------------------------------------------------------------------- Debridement Details Patient Name: Date of Service: [name], [name] 10/08/2021 2:45 PM Medical Record Number: [id number] Patient Account Number: [id number] Date of Birth/Sex: Treating RN: 07/07/1985 (36 y.o. [name], [name] Primary Care Provider: [name] Other Clinician: Referring Provider: Treating Provider/Extender: [name] in Treatment: 102 Debridement Performed for Assessment: Wound #8 Right,Lateral Foot Performed By: Physician [name], MD Debridement Type: Debridement Severity of Tissue Pre Debridement: Fat layer exposed Level of Consciousness (Pre-procedure): Awake and Alert Pre-procedure Verification/Time Out Yes - 15:30 Taken: Start Time: 15:30 Pain Control: Lidocaine T Area Debrided (L x W): otal 4.5 (cm) x 3.3 (cm) = 14.85 (cm) Tissue and other material debrided: Viable, Non-Viable, Callus, Slough, Subcutaneous, Slough Level: Skin/Subcutaneous Tissue Debridement Description: Excisional Instrument: Curette Bleeding: Minimum Hemostasis Achieved: Pressure End Time: 15:30 Procedural Pain: 0 Post  Procedural Pain: 0 Response to Treatment: Procedure was tolerated well Level of Consciousness (Post- Awake and Alert procedure): Post Debridement Measurements of Total Wound Length: (cm) 4.5 Width: (cm) 3.3 Depth: (cm) 1.5 Volume: (cm) 17.495 Character of Wound/Ulcer Post Debridement: Improved Severity of Tissue Post Debridement: Fat layer exposed Post Procedure Diagnosis Same as Pre-procedure Electronic Signature(s) Signed: 10/08/2021 4:33:43 PM By: [name] MD FACS Signed: 10/11/2021 4:44:20 PM By: Rhae Hammock RN Entered By: Rhae Hammock on 10/08/2021 15:33:32 -------------------------------------------------------------------------------- Debridement Details Patient Name: Date of Service: [name], [name] 10/08/2021 2:45 PM Medical  Record Number: [id number] Patient Account Number: [id number] Date of Birth/Sex: Treating RN: 1985-06-17 (36 y.o. [name], [name] Primary Care Provider: [name] Other Clinician: Referring Provider: Treating Provider/Extender: [name] in Treatment: 102 Debridement Performed for Assessment: Wound #12 Left,Dorsal Foot Performed By: Physician [name], MD Debridement Type: Debridement Severity of Tissue Pre Debridement: Fat layer exposed Level of Consciousness (Pre-procedure): Awake and Alert Pre-procedure Verification/Time Out Yes - 15:30 Taken: Start Time: 15:30 Pain Control: Lidocaine T Area Debrided (L x W): otal 1.3 (cm) x 0.8 (cm) = 1.04 (cm) Tissue and other material debrided: Viable, Non-Viable, Callus, Slough, Slough Level: Non-Viable Tissue Debridement Description: Selective/Open Wound Instrument: Curette Bleeding: Minimum Hemostasis Achieved: Pressure End Time: 15:30 Procedural Pain: 0 Post Procedural Pain: 0 Response to Treatment: Procedure was tolerated well Level of Consciousness (Post- Awake and Alert procedure): Post Debridement Measurements of Total  Wound Length: (cm) 1.3 Width: (cm) 0.8 Depth: (cm) 0.1 Volume: (cm) 0.082 Character of Wound/Ulcer Post Debridement: Improved Severity of Tissue Post Debridement: Fat layer exposed Post Procedure Diagnosis Same as Pre-procedure Electronic Signature(s) Signed: 10/08/2021 4:33:43 PM By: [name] MD FACS Signed: 10/11/2021 4:44:20 PM By: Rhae Hammock RN Entered By: Rhae Hammock on 10/08/2021 15:34:20 -------------------------------------------------------------------------------- HPI Details Patient Name: Date of Service: [name], [name] 10/08/2021 2:45 PM Medical Record Number: [id number] Patient Account Number: [id number] Date of Birth/Sex: Treating RN: Apr 22, 1985 (36 y.o. 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 He comes in for review of a wound over the plantar fifth metatarsal head extending into the lateral part of the foot. He was followed for this previously by his podiatrist Dr. Cornelius Moras. As the patient tells his story he went to see podiatry first for a swelling he developed on the lateral part of his fifth metatarsal head in May. He states this was "open" by podiatry and the area closed. He was followed up in June and it was again opened callus removed and it closed promptly. There were plans being made for surgery on the fifth metatarsal head in June however his blood sugar was apparently too high for anesthesia. Apparently the area was debrided and opened again in June and it is never closed since. Looking over the records from podiatry I am really not able to follow this. It was clear when he was first seen it was before 5/14 at that point he already had a wound. By 5/17 the  ulcer was resolved. I do not see anything about a procedure. On 5/28 noted to have pre-ulcerative moderate keratosis. X-ray noted 1/5 contracted toe and tailor's bunion and metatarsal deformity. On a visit date on 09/28/2018 the dorsal part of the left foot it healed and resolved. There was concern about swelling in his lower extremity he was sent to the ER.. As far as I can tell he was seen in the ER on 7/12 with an ulcer on his left foot. A DVT rule out of the left leg was negative. I do not think I have complete records from podiatry but I am not able to verify the procedures this patient states he had. He states after the last procedure the wound has never closed although I am not able to follow this in the records I have from podiatry. He has  not had a recent x-ray The patient has been using Neosporin on the wound. He is wearing a Darco shoe. He is still very active up on his foot working and exercising. Past medical history; type 2 diabetes ketosis-prone, leg swelling with a negative DVT study in July. Non-smoker ABI in our clinic was 0.85 on the left 10/16; substantial wound on the plantar left fifth met head extending laterally almost to the dorsal fifth MTP. We have been using silver alginate we gave him a Darco forefoot off loader. An x-ray did not show evidence of osteomyelitis did note soft tissue emphysema which I think was due to gas tracking through an open wound. There is no doubt in my mind he requires an MRI 10/23; MRI not booked until 3 November at the earliest this is largely due to his glucose sensor in the right arm. We have been using silver alginate. There has been an improvement 10/29; I am still not exactly sure when his MRI is booked for. He says it is the third but it is the 10th in epic. This definitely needs to be done. He is running a low-grade fever today but no other symptoms. No real improvement in the 1 02/26/2019 patient presents today for a follow-up visit here in  our clinic he is last been seen in the clinic on October 29. Subsequently we were working on getting MRI to evaluate and see what exactly was going on and where we would need to go from the standpoint of whether or not he had osteomyelitis and again what treatments were going be required. Subsequently the patient ended up being admitted to the hospital on 02/07/2019 and was discharged on 02/14/2019. This is a somewhat interesting admission with a discharge diagnosis of pneumonia due to COVID-19 although he was positive for COVID-19 when tested at the urgent care but negative x2 when he was actually in the hospital. With that being said he did have acute respiratory failure with hypoxia and it was noted he also have a left foot ulceration with osteomyelitis. With that being said he did require oxygen for his pneumonia and I level 4 L. He was placed on antivirals and steroids for the COVID-19. He was also transferred to the [facility] at one point. Nonetheless he did subsequently discharged home and since being home has done much better in that regard. The CT angiogram did not show any pulmonary embolism. With regard to the osteomyelitis the patient was placed on vancomycin and Zosyn while in the hospital but has been changed to Augmentin at discharge. It was also recommended that he follow- up with wound care and podiatry. Podiatry however wanted him to see [name] according to the patient prior to them doing anything further. His hemoglobin A1c was 9.9 as noted in the hospital. Have an MRI of the left foot performed while in the hospital on 02/04/2019. 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 There is an overlying plantar open wound noted an abscess tracking back along the lateral aspect of the fifth metatarsal shaft. There is otherwise diffuse cellulitis and mild fasciitis without findings of  polymyositis. The patient did have recently pneumonia secondary to COVID-19 I looked in the chart through epic and it does appear that the patient may need to have an additional x-ray just to ensure everything is cleared and that he has no airspace disease prior to putting him into the chamber.  03/05/2019; patient was readmitted to the clinic last week. He was hospitalized twice for a viral upper respiratory tract infection from 11/1 through 11/4 and then 11/5 through 11/12 ultimately this turned out to be Covid pneumonitis. Although he was discharged on oxygen he is not using it. He says he feels fine. He has no exercise limitation no cough no sputum. His O2 sat in our clinic today was 100% on room air. 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 He received Vanco and Zosyn in the hospital and then was discharged on 2 weeks of Augmentin. I do not see any relevant cultures. He was supposed to follow-up with infectious disease but I do not see that he has an appointment. 12/8; patient saw Dr. Novella Olive of infectious disease last week. He felt that he had had adequate antibiotic therapy. He did not go to follow-up with Dr. Amalia Hailey of podiatry and I have again talked to him about the pros and cons of this. He does not want to consider a ray amputation of this time. He is aware of the risks of recurrence, migration etc. He started HBO today and tolerated this well. He can complete the Augmentin that I gave him last week. I have looked over the lab work that Dr. Chana Bode ordered his C-reactive protein was 3.3 and his sedimentation rate was 17. The C-reactive protein is never really been measurably that high in this patient 12/15; not much change in the wound today however he has undermining along the lateral part of the foot again more extensively than last week. He has some rims of  epithelialization. We have been using silver alginate. He is undergoing hyperbarics but did not dive today 12/18; in for his obligatory first total contact cast change. Unfortunately there was pus coming from the undermining area around his fifth metatarsal head. This was cultured but will preclude reapplication of a cast. He is seen in conjunction with HBO 12/24; patient had staph lugdunensis in the wound in the undermining area laterally last time. We put him on doxycycline which should have covered this. The wound looks better today. I am going to give him another week of doxycycline before reattempting the total contact cast 12/31; the patient is completing antibiotics. Hemorrhagic debris in the distal part of the wound with some undermining distally. He also had hyper granulation. Extensive debridement with a #5 curette. The infected area that was on the lateral part of the fifth met head is closed over. I do not think he needs any more antibiotics. Patient was seen prior to HBO. Preparations for a total contact cast were made in the cast will be placed post hyperbarics 04/11/19; once again the patient arrives today without complaint. He had been in a cast all week noted that he had heavy drainage this week. This resulted in large raised areas of macerated tissue around the wound 1/14; wound bed looks better slightly smaller. Hydrofera Blue has been changing himself. He had a heavy drainage last week which caused a lot of maceration around the wound so I took him out of a total contact cast he says the drainage is actually better this week He is seen today in conjunction with HBO 1/21; returns to clinic. He was up in [place] for a day or 2 attending a funeral. He comes back in with the wound larger and with a large area of exposed bone.  He had osteomyelitis and septic arthritis of the fifth left metatarsal head while he was in hospital. He received IV antibiotics in the hospital for a  prolonged period of time then 3 weeks of Augmentin. Subsequently I gave him 2 weeks of doxycycline for more superficial wound infection. When I saw this last week the wound was smaller the surface of the wound looks satisfactory. 1/28; patient missed hyperbarics today. Bone biopsy I did last time showed Enterococcus faecalis and Staphylococcus lugdunensis . He has a wide area of exposed bone. We are going to use silver alginate as of today. I had another ethical discussion with the patient. 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 In this situation I think the likelihood of healing this is low. Therefore I have recommended a ray amputation and with the patient's agreement I have referred him to Dr. Doran Durand. The other issue is that his compliance with hyperbarics has been minimal because of his work schedule and given his underlying decision I am going to stop this today READMISSION 10/24/2019 MRI 09/29/2019 left foot IMPRESSION: 1. Apparent skin ulceration inferior and lateral to the 5th metatarsal base with underlying heterogeneous T2 signal and enhancement in the subcutaneous fat. Small peripherally enhancing fluid collections along the plantar and lateral aspects of the 5th metatarsal base suspicious for abscesses. 2. Interval amputation through the mid 5th metatarsal with nonspecific low-level marrow edema and enhancement. Given the proximity to the adjacent soft tissue inflammatory changes, osteomyelitis cannot be excluded. 3. The additional bones appear unremarkable. MRI 09/29/2019 right foot IMPRESSION: 1. Soft tissue ulceration lateral to the 5th MTP joint. There is low-level T2 hyperintensity within the 4th and 5th metatarsal heads and adjacent proximal phalanges without abnormal T1 signal or cortical destruction. These findings are nonspecific and could be seen with early marrow edema, hyperemia or early osteomyelitis. No evidence of septic  joint. 2. Mild tenosynovitis and synovial enhancement associated with the extensor digitorum tendons at the level of the midfoot. 3. Diffuse low-level muscular T2 hyperintensity and enhancement, most consistent with diabetic myopathy. LEFT FOOT BONE Methicillin resistant staphylococcus aureus Staphylococcus lugdunensis MIC MIC CIPROFLOXACIN >=8 RESISTANT Resistant <=0.5 SENSI... Sensitive CLINDAMYCIN <=0.25 SENS... Sensitive >=8 RESISTANT Resistant ERYTHROMYCIN >=8 RESISTANT Resistant >=8 RESISTANT Resistant GENTAMICIN <=0.5 SENSI... Sensitive <=0.5 SENSI... Sensitive Inducible Clindamycin NEGATIVE Sensitive NEGATIVE Sensitive OXACILLIN >=4 RESISTANT Resistant 2 SENSITIVE Sensitive RIFAMPIN <=0.5 SENSI... Sensitive <=0.5 SENSI... Sensitive TETRACYCLINE <=1 SENSITIVE Sensitive <=1 SENSITIVE Sensitive TRIMETH/SULFA <=10 SENSIT Sensitive <=10 SENSIT Sensitive ... Marland Kitchen.. VANCOMYCIN 1 SENSITIVE Sensitive <=0.5 SENSI... Sensitive Right foot bone . Component 3 wk ago Specimen Description BONE Special Requests RIGHT 4 METATARSAL SAMPLE B Gram Stain NO WBC SEEN NO ORGANISMS SEEN Culture RARE METHICILLIN RESISTANT STAPHYLOCOCCUS AUREUS NO ANAEROBES ISOLATED Performed at [facility], [place] [address], [place], [place] [address] Report Status 10/08/2019 FINAL Organism ID, Bacteria METHICILLIN RESISTANT STAPHYLOCOCCUS AUREUS Resulting Agency [facility] Susceptibility Methicillin resistant staphylococcus aureus MIC CIPROFLOXACIN >=8 RESISTANT Resistant CLINDAMYCIN <=0.25 SENS... Sensitive ERYTHROMYCIN >=8 RESISTANT Resistant GENTAMICIN <=0.5 SENSI... Sensitive Inducible Clindamycin NEGATIVE Sensitive OXACILLIN >=4 RESISTANT Resistant RIFAMPIN <=0.5 SENSI... Sensitive TETRACYCLINE <=1 SENSITIVE Sensitive TRIMETH/SULFA <=10 SENSIT Sensitive ... VANCOMYCIN 1 SENSITIVE Sensitive This is a patient we had in clinic earlier this year with a wound over his left fifth metatarsal head. 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 Doran Durand on 05/16/2019. He seems to have developed problems with wounds on his bilateral feet in June 2021 although he may have had problems earlier than this. He was in an urgent care with a right foot ulcer on 09/26/2019 and given a course of doxycycline. This was apparently after having trouble getting into see orthopedics. He was seen by podiatry on 09/28/2019 noted to have bilateral lower extremity ulcers including the left lateral fifth metatarsal base and the right subfifth met head. It was noted that had purulent drainage at that time. He required hospitalization from 6/20 through 7/2. This was because of worsening right foot wounds. He underwent bilateral operative incision and drainage and bone biopsies bilaterally. Culture results are listed above. He has been referred back to clinic by Dr. Jacqualyn Posey of podiatry. He is also followed by Dr. Megan Salon who saw him yesterday. He was discharged from hospital on Zyvox Flagyl and Levaquin and yesterday changed to doxycycline Flagyl and Levaquin. His inflammatory markers on 6/26 showed a sedimentation rate of 129 and a C-reactive protein of 5. This is improved to 14 and 1.3 respectively. This would indicate improvement. ABIs in our clinic today were 1.23 on the right and 1.20 on the left 11/01/2019 on evaluation today patient appears to be doing fairly well in regard to the wounds on his feet at this point. Fortunately there is no signs of active infection at this time. No fevers, chills, nausea, vomiting, or diarrhea. He currently is seeing infectious disease and still under their care at this point. Subsequently he also has both wounds  which she has not been using collagen on as he did not receive that in his packaging he did not call us and let us know that. Apparently that just was missed on the order. Nonetheless we will get that straightened out today. 8/9-Patient returns for bilateral foot wounds, using Prisma with hydrogel moistened dressings, and the wounds appear stable. Patient using surgical shoes, avoiding much pressure or weightbearing as much as possible 8/16; patient has bilateral foot wounds. 1 on the right lateral foot proximally the other is on the left mid lateral foot. Both required debridement of callus and thick skin around the wounds. We have been using silver collagen 8/27; patient has bilateral lateral foot wounds. The area on the left substantially surrounded by callus and dry skin. This was removed from the wound edge. The underlying wound is small. The area on the right measured somewhat smaller today. 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 Unfortunately I did not update his antibiotics during today's visit. 9/10 I reviewed Dr. Hale Bogus last notes he felt he had completed antibiotics his inflammatory markers were reasonably well controlled. He has a small wound on the lateral left foot and a tiny area on the right which is just above closed. He is using Hydrofera Blue with border foam he has bilateral surgical shoes 9/24; 2 week f/u. doing well. right foot is closed. left foot still undermined. 10/14; right foot remains closed at the fifth met head. The area over the base of the left fifth metatarsal has a small open area but considerable undermining towards the plantar foot. Thick callus skin around this suggests an adequate pressure relief. We have talked about this. He says he is going to go back into his cam boot. I suggested  a total contact cast he did not seem enamored with this suggestion 10/26; left foot base of the fifth  metatarsal. Same condition as last time. He has skin over the area with an open wound however the skin is not adherent. He went to see Dr. Earleen Newport who did an x-ray and culture of his foot I have not reviewed the x-ray but the patient was not told anything. He is on doxycycline 11/11; since the patient was last here he was in the emergency room on 10/30 he was concerned about swelling in the left foot. They did not do any cultures or x-rays. They changed his antibiotics to cephalexin. Previous culture showed group B strep. The cephalexin is appropriate as doxycycline has less than predictable coverage. Arrives in clinic today with swelling over this area under the wound. He also has a new wound on the right fifth metatarsal head 11/18; the patient has a difficult wound on the lateral aspect of the left fifth metatarsal head. The wound was almost ballotable last week I opened it slightly expecting to see purulence however there was just bleeding. I cultured this this was negative. X-ray unchanged. We are trying to get an MRI but I am not sure were going to be able to get this through his insurance. He also has an area on the right lateral fifth metatarsal head this looks healthier 12/3; the patient finally got our MRI. Surprisingly this did not show osteomyelitis. I did show the soft tissue ulceration at the lateral plantar aspect of the fifth metatarsal base with a tiny residual 6 mm abscess overlying the superficial fascia I have tried to culture this area I have not been able to get this to grow anything. Nevertheless the protruding tissue looks aggravated. I suspect we should try to treat the underlying "abscess with broad-spectrum antibiotics. I am going to start him on Levaquin and Flagyl. He has much less edema in his legs and I am going to continue to wrap his legs and see him weekly 12/10. I started Levaquin and Flagyl on him last week. He just picked up the Flagyl apparently there was some delay.  The worry is the wound on the left fifth metatarsal base which is substantial and worsening. His foot looks like he inverts at the ankle making this a weightbearing surface. Certainly no improvement in fact I think the measurements of this are somewhat worse. We have been using 12/17; he apparently just got the Levaquin yesterday this is 2 weeks after the fact. He has completed the Flagyl. The area over the left fifth metatarsal base still has protruding granulation tissue although it does not look quite as bad as it did some weeks ago. He has severe bilateral lymphedema although we have not been treating him for wounds on his legs this is definitely going to require compression. There was so much edema in the left I did not wish to put him in a total contact cast today. I am going to increase his compression from 3-4 layer. The area on the right lateral fifth met head actually look quite good and superficial. 12/23; patient arrived with callus on the right fifth met head and the substantial hyper granulated callused wound on the base of his fifth metatarsal. He says he is completing his Levaquin in 2 days but I do not think that adds up with what I gave him but I will have to double check this. We are using Hydrofera Blue on both areas. My plan is  to put the left leg in a cast the week after New Year's 04/06/2020; patient's wounds about the same. Right lateral fifth metatarsal head and left lateral foot over the base of the fifth metatarsal. There is undermining on the left lateral foot which I removed before application of total contact cast continuing with Hydrofera Blue new. Patient tells me he was seen by endocrinology today lab work was done [Dr. Kerr]. Also wondering whether he was referred to cardiology. I went over some lab work from previously does not have chronic renal failure certainly not nephrotic range proteinuria he does have very poorly controlled diabetes but this is not his most  updated lab work. Hemoglobin A1c has been over 11 1/10; the patient had a considerable amount of leakage towards mid part of his left foot with macerated skin however the wound surface looks better the area on the right lateral fifth met head is better as well. I am going to change the dressing on the left foot under the total contact cast to silver alginate, continue with Hydrofera Blue on the right. 1/20; patient was in the total contact cast for 10 days. Considerable amount of drainage although the skin around the wound does not look too bad on the left foot. The area on the right fifth metatarsal head is closed. Our nursing staff reports large amount of drainage out of the left lateral foot wound 1/25; continues with copious amounts of drainage described by our intake staff. PCR culture I did last week showed E. coli and Enterococcus faecalis and low quantities. Multiple resistance genes documented including extended spectrum beta lactamase, MRSA, MRSE, quinolone, tetracycline. The wound is not quite as good this week as it was 5 days ago but about the same size 2/3; continues with copious amounts of malodorous drainage per our intake nurse. The PCR culture I did 2 weeks ago showed E. coli and low quantities of Enterococcus. There were multiple resistance genes detected. I put Neosporin on him last week although this does not seem to have helped. The wound is slightly deeper today. Offloading continues to be an issue here although with the amount of drainage she has a total contact cast is just not going to work 2/10; moderate amount of drainage. Patient reports he cannot get his stocking on over the dressing. I told him we have to do that the nurse gave him suggestions on how to make this work. The wound is on the bottom and lateral part of his left foot. Is cultured predominantly grew low amounts of Enterococcus, E. coli and anaerobes. There were multiple resistance genes detected including  extended spectrum beta lactamase, quinolone, tetracycline. I could not think of an easy oral combination to address this so for now I am going to do topical antibiotics provided by [facility] I think the main agents here are vancomycin and an aminoglycoside. We have to be able to give him access to the wounds to get the topical antibiotic on 2/17; moderate amount of drainage this is unchanged. He has his Keystone topical antibiotic against the deep tissue culture organisms. He has been using this and changing the dressing daily. Silver alginate on the wound surface. 2/24; using Keystone antibiotic with silver alginate on the top. He had too much drainage for a total contact cast at one point although I think that is improving and I think in the next week or 2 it might be possible to replace a total contact cast I did not do this today. In  general the wound surface looks healthy however he continues to have thick rims of skin and subcutaneous tissue around the wide area of the circumference which I debrided 06/04/2020 upon evaluation today patient appears to be doing well in regard to his wound. I do feel like he is showing signs of improvement. There is little bit of callus and dead tissue around the edges of the wound as well as what appears to be a little bit of a sinus tract that is off to the side laterally I would perform debridement to clear that away today. 3/17; left lateral foot. The wound looks about the same as I remember. Not much depth surface looks healthy. No evidence of infection 3/25; left lateral foot. Wound surface looks about the same. Separating epithelium from the circumference. There really is no evidence of infection here however not making progress by my view 3/29; left lateral foot. Surface of the wound again looks reasonably healthy still thick skin and subcutaneous tissue around the wound margins. There is no evidence of infection. One of the concerns being brought up by the  nurses has again the amount of drainage vis--vis continued use of a total contact cast 4/5; left lateral foot at roughly the base of the fifth metatarsal. Nice healthy looking granulated tissue with rims of epithelialization. The overall wound measurements are not any better but the tissue looks healthy. The only concern is the amount of drainage although he has no surrounding maceration with what we have been doing recently to absorb fluid and protect his skin. He also has lymphedema. 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 He states that the kerlix/coban under the TCC rolled down and could not get it back up. He has been using an offloading scooter and has somehow hurt his right foot using this device. This happened last week. He states that the side of his right foot developed a blister and opened. The top of his foot also has a few small open wounds he thinks is due to his socks rubbing in his shoes. He has not been using any dressings to the wound. He denies purulent drainage, fever/chills or erythema to the wounds. 4/22; patient presents for 1 week follow-up. He developed new wounds to the right foot that were evaluated at last clinic visit. He continues to have a total contact cast to the left leg and he reports no issues. He has been using silver collagen to the right foot wounds with no issues. He denies purulent drainage, fever/chills or erythema to the right foot wounds. He has no complaints today 4/25; patient presents for 1 week follow-up. He has a total contact cast of the left leg and reports no issues. He has been using silver alginate to the right foot wound. He denies purulent drainage, fever/chills or erythema to the right foot  wounds. 5/2 patient presents for 1 week follow-up. T contact cast on the left. The wound which is on the base of the plantar foot at the base of the fifth metatarsal otal actually looks quite good and dimensions continue to gradually contract. HOWEVER the area on the right lateral fifth metatarsal head is much larger than what I remember from 2 weeks ago. Once more is he  has significant levels of hypergranulation. Noteworthy that he had this same hyper granulated response on his wound on the left foot at one point in time. So much so that he I thought there was an underlying fluid collection. Based on this I think this just needs debridement. 5/9; the wound on the left actually continues to be gradually smaller with a healthy surface. Slight amount of drainage and maceration of the skin around but not too bad. However he has a large wound over the right fifth metatarsal head very much in the same configuration as his left foot wound was initially. I used silver nitrate to address the hyper granulated tissue no mechanical debridement 5/16; area on the left foot did not look as healthy this week deeper thick surrounding macerated skin and subcutaneous tissue. The area on the right foot fifth met head was about the same The area on the right ankle that we identified last week is completely broken down into an open wound presumably a stocking rubbing issue 5/23; patient has been using a total contact cast to the left side. He has been using silver alginate underneath. He has also been using silver alginate to the right foot wounds. He has no complaints today. He denies any signs of infection. 5/31; the left-sided wound looks some better measure smaller surface granulation looks better. We have been using silver alginate under the total contact cast The large area on his right fifth met head and right dorsal foot look about the same still using silver alginate 6/6; neither side is good as I was hoping  although the surface area dimensions are better. A lot of maceration on his left and right foot around the wound edge. Area on the dorsal right foot looks better. He says he was traveling. I am not sure what does the amount of maceration around the plantar wounds may be drainage issues 6/13; in general the wound surfaces look quite good on both sides. Macerated skin and raised edges around the wound required debridement although in general especially on the left the surface area seems improved. The area on the right dorsal ankle is about the same I thought this would not be such a problem to close 6/20; not much change in either wound although the one on the right looks a little better. Both wounds have thick macerated edges to the skin requiring debridements. We have been using silver alginate. The area on the dorsal right ankle is still open I thought this would be closed. 6/28; patient comes in today with a marked deterioration in the right foot wound fifth met head. Wide area of exposed bone this is a drastic change from last time. The area on the left there we have been casting is stagnant. We have been using silver alginate in both wound areas. 7/5; bone culture I did for PCR last time was positive for Pseudomonas, group B strep, Enterococcus and Staph aureus. There was no suggestion of methicillin resistance or ampicillin resistant genes. This was resistant to tetracycline however He comes into the clinic today with the area over his right plantar fifth metatarsal head which had been doing so well 2 weeks ago completely necrotic feeling bone. I do not know that this is going to be salvageable. The left foot wound is certainly no smaller but it has a better surface and is superficial. 7/8; patient called in this morning to say that his total contact cast was rubbing against his foot. He states he is doing fine  overall. He denies signs of infection. 7/12; continued deterioration in the wound  over the right fifth metatarsal head crumbling bone. This is not going to be salvageable. The patient agrees and wants to be referred to Dr. Doran Durand which we will attempt to arrange as soon as possible. I am going to continue him on antibiotics as long as that takes so I will renew those today. The area on the left foot which is the base of the fifth metatarsal continues to look somewhat better. Healthy looking tissue no depth no debridement is necessary here. 7/20; the patient was kindly seen by Dr. Doran Durand of orthopedics on 10/19/2020. 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 Towards this end we have taken him out of the total contact cast on the left we will put him in a wrap with Hydrofera Blue. As I understand things surgery is planned for 7/21 7/27; patient had his surgery last Thursday. 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 Apparently everything went well we did not still disturb that today The area on the left foot actually looks quite good. He has been much less mobile which probably explains this he did not seem to do well in the total contact cast secondary to drainage and maceration I think. 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 Fortunately there is no evidence of active infection at this time. No fevers, chills, nausea, vomiting, or diarrhea. Overall I think that he is actually doing extremely well. Nonetheless I do believe that he is staying off of this more following the surgery in his right foot that is the reason the left is doing so great. 8/16; left plantar foot wound. This looks smaller than the last time I saw this he is using Hydrofera Blue. The surgical wound on the right foot is being followed by Dr. Doran Durand we did not look at this today. He has surgical shoes on both  feet 8/23; left plantar foot wound not as good this week. Surrounding macerated skin and subcutaneous tissue everything looks moist and wet. I do not think he is offloading this adequately. He is using a surgical shoe Apparently the right foot surgical wound is not open although I did not check his foot 8/31; left plantar foot lateral aspect. Much improved this week. He has no maceration. Some improvement in the surface area of the wound but most impressively the depth is come in we are using silver alginate. The patient is a Product/process development scientist. He is asked that we write him a letter so he can go back to work. I have also tried to see if we can write something that will allow him to limit the amount of time that he is on his foot at work. 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 Hopefully the latter can be avoided 9/6; I believe the patient missed an appointment last week. He arrives in today with a wound looking roughly the same certainly no better. Undermining laterally and also inferiorly. We used molecuLight today in training with the patient's permission.. We are using silver alginate 9/21 wound is measuring bigger this week although this may have to do with the aggressive circumferential debridement  last week in response to the blush fluorescence on the MolecuLight. Culture I did last week showed significant MSSA and E. coli. I put him on Augmentin but he has not started it yet. We are also going to send this for compounded antibiotics at [facility]. There is no evidence of systemic infection 9/29; silver alginate. His Keystone arrived. He is completing Augmentin in 2 days. Offloading in a cam boot. Moderate drainage per our intake staff 10/5; using silver alginate. He has been using his [place]. He has completed his Augmentin. Per our intake nurse still a lot of drainage, far too much to consider a total  contact cast. Wound measures about the same. He had the same undermining area that I defined last week from a roughly 11-3. I remove this today 10/12; using silver alginate he is using the [place]. He comes in for a nurse visit hence we are applying [employer] twice a week. Measuring slightly better today and less notable drainage. Extensive debridement of the wound edge last time 10/18; using topical Keystone and silver alginate and a soft cast. Wound measurements about the same. Drainage was through his soft cast. We are changing this twice a week Tuesdays and Friday 10/25; comes in with moderate drainage. Still using Keystone silver alginate and a soft cast. Wound dimensions completely the same.He has a lot of edema in the left leg he has lymphedema. Asking for [name] to consider wrapping him as he cannot get his stocking on over the soft cast 11/2; comes in with moderate to large drainage slightly smaller in terms of width we have been using [place]. His wound looks satisfactory but not much improvement 11/4; patient presents today for obligatory cast change. Has no issues or complaints today. He denies signs of infection. 11/9; patient traveled this weekend to [place], was on the cast quite a bit. Staining of the cast with black material from his walking boot. Drainage was not quite as bad as we feared. Using silver alginate and Keystone 11/16; we do not have size for cast therefore we have been putting a soft cast on him since the change on Friday. Still a significant amount of drainage necessitating changing twice a week. We have been using the Keystone at cast changes either hard or soft as well as silver alginate Comes in the clinic with things actually looking fairly good improvement in width. He says his offloading is about the same 02/24/2021 upon evaluation today patient actually comes back in and is doing excellent in regard to his foot ulcer this is significantly smaller even compared to the  last visit. The soft cast seems to have done extremely well for him which is great news. I do not see any signs of infection minimal debridement will be needed today. 11/30; left lateral foot much improved half a centimeter improvement in surface area. No evidence of infection. He seems to be doing better with the soft cast in the TCC therefore we will continue with this. He comes back in later in the week for a change with the nurses. This is due to drainage 12/6; no improvement in dimensions. Under illumination some debris on the surface we have been using silver alginate, soft cast. If there is anything optimistic here he seems to have have less drainage 12/13. Dimensions are improved both length and width and slightly in depth. Appears to be quite healthy today. Raised edges of this thick skin and callus around the edges however. He is in a soft cast were bringing  him back once for a change on Friday. Drainage is better 12/20. Dimensions are improved. He still has raised edges of thick skin and callus around the edges. We are using a soft cast 12/28; comes in today with thick callus around the wound. Using silver under alginate under a soft cast. I do not think there is much improvement in any measurement 2023 04/06/2021; patient was put in a total contact cast. Unfortunately not much change in surface area 1/10; not much different still thick callus and skin around the edge in spite of the total contact cast. This was just debrided last week we have been using the [facility] compounded antibiotic and silver alginate under a total contact cast 1/18 the patient's wound on the left side is doing nicely. smaller HOWEVER he comes in today with a wound on the right foot laterally. blister most likely serosangquenous drainage 1/24; the patient continues to do well in terms of the plantar left foot which is continued to contract using silver alginate under the total contact cast HOWEVER the right lateral  foot is bigger with denuded skin around the edges. I used pickups and a #15 scalpel to remove this this looks like the remanence of a large blister. Cannot rule out infection. Culture in this area I did last week showed Staphylococcus lugdunensis few colonies. I am going to try to address this with his [employer] antibiotic that is done so well on the left having linezolid and this should cover the staph 2/1; the patient's wound on his left foot which was the original plantar foot wound thick skin and eschar around the edges even in the total contact cast but the wound surface does not look too bad The real problem is on how his right lateral foot at roughly the base of the fifth metatarsal. The wound is completely necrotic more worrisome than that there is swelling around the edges of this. We have been using silver alginate on both wounds and Keystone on the right foot. Unfortunately I think he is going to require systemic antibiotics while we await cultures. He did not get the x-ray done that we ordered last week [lost the prescription 2/7; disappointingly in the area on the left foot which we are treating with a total contact cast is still not closed although it is much smaller. He continues to have a lot of callus around the wound edge. -Right lateral foot culture I did last week was negative x-ray also negative for osteomyelitis. 2/15: TCC silver alginate on the left and silver alginate on the right lateral. No real improvement in either area 05/26/2021: T oday, the wounds are roughly the same size as at his previous visit, post-debridement. He continues to endorse fairly substantial drainage, particularly on the right. He has been in a total contact cast on the left. There is still some callus surrounding this lesion. On the right, the periwound skin is quite macerated, along with surrounding callus. The center of the right-sided wound also has some dark, densely adherent material, which is very  difficult to remove. 06/02/2021: Today, both wounds are slightly smaller. He has been using zinc oxide ointment around the right ulcer and the degree of maceration has improved markedly. There continues to be an area of nonviable tissue in the center of the right sided ulcer. The left-sided wound, which has been in the total contact cast. Appears clean and the degree of callus around it is less than previously. 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 He had to take the stairs and both wounds have increased in size. The left foot, which has been in a total contact cast, has developed a tunnel tracking to the lateral aspect of his foot. The nonviable tissue in the center of the right-sided ulcer remains recalcitrant to debridement. There is significant undermining surrounding the entirety of the left sided wound. 06/16/2021: The elevator at school has been fixed and the patient has been able to avoid putting as much weight on his wounds over the past week. We converted the left foot wound into a single lesion today, but despite this, the wound is actually smaller. The base is healthy with limited periwound callus. On the right, the central necrotic area is still present. He continues to be quite macerated around the right sided wound, despite applying barrier cream. This does, however, have the benefit of softening the callus to make it more easily removable. 06/23/2021: Today, the left wound is smaller. The lateral aspect that had opened up previously is now closed. The wound base has a healthy bed of granulation tissue and minimal slough. Unfortunately, on the right, the wound is larger and continues to be fairly macerated. He has also reopened the wound at his right ankle. He thinks this is due to the gait he has adopted secondary to his total contact cast and boot. 06/30/2021: T oday, both wounds are a little bit larger. The lateral  aspect on the left has remained closed. He continues to have significant periwound maceration. The culture that I took from the right sided wound grew a population of bacteria that is not covered by his current [facility] antibiotic. The center of the right- sided wound continues to appear necrotic with nonviable fat. It probes deeper today, but does not reach bone. 07/07/2021: The periwound maceration is a little bit less today. The right lateral foot wound has some areas that appear more viable and the necrotic center also looks a little bit better. The wound on the dorsal surface of his right foot near the ankle is contracting and the surface appears healthy. The left plantar wound surface looks healthy, but there is some new undermining on the medial portion. He did get his new Keystone antibiotic and began applying that to the right foot wound on Saturday. 07/14/2021: The intake nurse reported substantial drainage from his wounds, but the periwound skin actually looks better than is typical for him. The wound on the dorsal surface of his right foot near the ankle is smaller and just has a small open area underneath some dried eschar. The left plantar wound surface looks healthy and there has been no significant accumulation of callus. The right lateral foot wound looks quite a bit better, with the central portion, which typically appears necrotic, looking more viable albeit pale. 07/22/2021: His left foot is extremely macerated today. The wound is about the same size. The wound on the dorsal surface of his right foot near the ankle had closed, but he traumatized it removing the dressing and there is a tiny skin tear in that location. The right lateral foot wound is bigger, but the surface appears healthy. 07/30/2021: The wound on the dorsal surface of his right foot near the ankle is closed. The right lateral foot wound again is a little bit bigger due to some undermining. The periwound skin is in  better condition, however. He has been applying zinc oxide. The wound surface is a little bit  dry today. On the left, he does not have the substantial maceration that we frequently see. The wound itself is smaller and has a clean surface. 08/06/2021: Both wounds seem to have deteriorated over the past week. The right lateral foot wound has a dry surface but the periwound is boggy.. Overall wound dimensions are about the same. On the left, the wound is about the same size, but there is more undermining present underneath periwound callus. 08/13/2021: The right sided wound looks about the same, but on the left there has been substantial deterioration. The undermining continues to extend under periwound callus. Once this was removed, substantial extension of the wound was present. There is no odor or purulent drainage but clearly the wounds have broken down. 08/20/2021: The wounds look about the same today. He has been out of his total contact cast and has just been changing the dressings using topical Keystone with PolyMem Ag, Kerlix and Ace bandages. The wound on the top of his right ankle has reopened but this is quite small. There was a little bit of purulent material that I expressed when examining this wound. 08/24/2021: After the aggressive debridement I performed at his last visit, the wounds actually look a little bit better today. They are smaller with the exception of the wound on the top of his right ankle which is a little bit bigger as some more skin pulled off when he was changing his dressing. We are using topical Keystone with PolyMem Ag Kerlix and Ace bandages. 09/02/2021: There has been really no change to any of his wounds. 09/16/2021: The patient was hospitalized last week with nausea, vomiting, and dehydration. He says that while he was in the hospital, his wounds were not really addressed properly. T oday, both plantar foot wounds are larger and the periwound skin is macerated. The wound  on the dorsum of his right foot has a scab on the top. The right foot now has a crater where previously he had had nonviable fat. It looks as though this simply died and fell out. The periwound callus is wet. 09/24/2021: His wounds have deteriorated somewhat since his last visit. The wound on the dorsum of his right foot near his ankle is larger and has more nonviable tissue present. The crater in his right foot is even deeper; I cannot quite palpate or probe to bone but I am sure it is close. The wound on his left plantar foot has an odd boggy area in the center that almost feels as though it has fluid within it. He has run out of his topical Keystone antibiotic. We are using silver alginate on his wounds. 09/29/2021: He has developed a new wound on the dorsum of his left foot near his ankle. He says he thinks his wrapping is rubbing in that site. I would concur with this as the wound on his right ankle is larger. The left foot looks about the same. The right foot has the crater that was present last week. No significant slough accumulation, but his foot remains quite swollen and warm despite oral antibiotic therapy. 10/08/2021: All of his wounds look about the same as last week. He did not start his oral antibiotics that are prescribed until just a couple of days ago; his [employer] compounded antibiotics formula has been changed and he is awaiting delivery of the new recipe. His MRI that was scheduled for earlier this week was canceled as no prior authorization had been obtained; unfortunately the tech responsible sent an  email to my old [employer] email, which I no longer use nor have access to. Electronic Signature(s) Signed: 10/08/2021 3:49:18 PM By: [name] MD FACS Entered By: [name] on 10/08/2021 15:49:18 -------------------------------------------------------------------------------- Physical Exam Details Patient Name: Date of Service: [name], [name] 10/08/2021 2:45  PM Medical Record Number: [id number] Patient Account Number: [id number] Date of Birth/Sex: Treating RN: 1985/05/08 (36 y.o. [name] Primary Care Provider: [name] Other Clinician: Referring Provider: Treating Provider/Extender: [name] in Treatment: 102 Constitutional . . . . No acute distress.Marland Kitchen Respiratory Normal work of breathing on room air.. Notes 10/08/2021: His wounds are basically unchanged from last week. The warmth and erythema on the right foot seems slightly improved, but the foot and leg are still more edematous than the left. Electronic Signature(s) Signed: 10/08/2021 3:50:14 PM By: [name] MD FACS Entered By: [name] on 10/08/2021 15:50:13 -------------------------------------------------------------------------------- Physician Orders Details Patient Name: Date of Service: [name], [name] 10/08/2021 2:45 PM Medical Record Number: [id number] Patient Account Number: [id number] Date of Birth/Sex: Treating RN: 10-26-1985 (36 y.o. 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 - Dr. [name] - Room 1 with [name] Return A Thursday 10/14/21 @ 1530 Bathing/ Shower/ Hygiene May shower with protection but do not get wound dressing(s) wet. - Use Cast Protector Bag on Right and left  legs Edema Control - Lymphedema / SCD / Other Bilateral Lower Extremities Elevate legs to the level of the heart or above for 30 minutes daily and/or when sitting, a frequency of: - throughout the day Avoid standing for long periods of time. Exercise regularly Compression stocking or Garment 20-30 mm/Hg pressure to: - to both legs daily Off-Loading Open toe surgical shoe to: - right foot Additional Orders / Instructions Follow Nutritious Diet Wound Treatment Wound #11 - Foot Wound Laterality: Right, Anterior Cleanser: Soap and Water 1 x Per Day/30 Days Discharge Instructions: May shower and wash wound with dial antibacterial soap and water prior to dressing change. Cleanser: Byram Ancillary Kit - 15 Day Supply (Generic) 1 x Per Day/30 Days Discharge Instructions: Use supplies as instructed; Kit contains: (15) Saline Bullets; (15) 3x3 Gauze; 15 pr Gloves Topical: keystone antibiotic compound 1 x Per Day/30 Days Prim Dressing: KerraCel Ag Gelling Fiber Dressing, 4x5 in (silver alginate) (Dispense As Written) 1 x Per Day/30 Days ary Discharge Instructions: Apply silver alginate to wound bed as instructed Secondary Dressing: ABD Pad, 8x10 (Generic) 1 x Per Day/30 Days Discharge Instructions: Apply over primary dressing as directed. Secondary Dressing: Optifoam Non-Adhesive Dressing, 4x4 in (Generic) 1 x Per Day/30 Days Discharge Instructions: Apply over primary dressing as directed. Secondary Dressing: Zetuvit Plus 4x8 in (Dispense As Written) 1 x Per Day/30 Days Discharge Instructions: Apply over primary dressing as directed. Secondary Dressing: NonWoven Sponge, 4x4 (in/in) (Generic) 1 x Per Day/30 Days Secured With: Coban Self-Adherent Wrap 4x5 (in/yd) (Generic) 1 x Per Day/30 Days Discharge Instructions: Secure with Coban as directed. Secured  With: Kerlix Roll Sterile, 4.5x3.1 (in/yd) (Generic) 1 x Per Day/30 Days Discharge Instructions: Secure with Kerlix as directed. Secured With: 66M  Medipore H Soft Cloth Surgical T ape, 4 x 10 (in/yd) (Generic) 1 x Per Day/30 Days Discharge Instructions: Secure with tape as directed. Wound #12 - Foot Wound Laterality: Dorsal, Left Cleanser: Soap and Water 1 x Per Day/30 Days Discharge Instructions: May shower and wash wound with dial antibacterial soap and water prior to dressing change. Cleanser: Byram Ancillary Kit - 15 Day Supply (Generic) 1 x Per Day/30 Days Discharge Instructions: Use supplies as instructed; Kit contains: (15) Saline Bullets; (15) 3x3 Gauze; 15 pr Gloves Topical: keystone antibiotic compound 1 x Per Day/30 Days Prim Dressing: KerraCel Ag Gelling Fiber Dressing, 4x5 in (silver alginate) (Dispense As Written) 1 x Per Day/30 Days ary Discharge Instructions: Apply silver alginate to wound bed as instructed Secondary Dressing: ABD Pad, 8x10 (Generic) 1 x Per Day/30 Days Discharge Instructions: Apply over primary dressing as directed. Secondary Dressing: NonWoven Sponge, 4x4 (in/in) (Generic) 1 x Per Day/30 Days Secured With: Coban Self-Adherent Wrap 4x5 (in/yd) (Generic) 1 x Per Day/30 Days Discharge Instructions: Secure with Coban as directed. Secured With: [employer], 4.5x3.1 (in/yd) (Generic) 1 x Per Day/30 Days Discharge Instructions: Secure with Kerlix as directed. Secured With: 66M Medipore H Soft Cloth Surgical T ape, 4 x 10 (in/yd) (Generic) 1 x Per Day/30 Days Discharge Instructions: Secure with tape as directed. Wound #3 - Foot Wound Laterality: Plantar, Left, Lateral Cleanser: Soap and Water 1 x Per Day/30 Days Discharge Instructions: May shower and wash wound with dial antibacterial soap and water prior to dressing change. Cleanser: Byram Ancillary Kit - 15 Day Supply (Generic) 1 x Per Day/30 Days Discharge Instructions: Use supplies as instructed; Kit contains: (15) Saline Bullets; (15) 3x3 Gauze; 15 pr Gloves Topical: keystone antibiotic compound 1 x Per Day/30 Days Prim Dressing: KerraCel Ag  Gelling Fiber Dressing, 4x5 in (silver alginate) (Dispense As Written) 1 x Per Day/30 Days ary Discharge Instructions: Apply silver alginate to wound bed as instructed Secondary Dressing: ABD Pad, 8x10 (Generic) 1 x Per Day/30 Days Discharge Instructions: Apply over primary dressing as directed. Secondary Dressing: Optifoam Non-Adhesive Dressing, 4x4 in (Generic) 1 x Per Day/30 Days Discharge Instructions: Apply over primary dressing as directed. Secondary Dressing: Zetuvit Plus 4x8 in (Generic) 1 x Per Day/30 Days Discharge Instructions: Apply over primary dressing as directed. Secondary Dressing: NonWoven Sponge, 4x4 (in/in) (Generic) 1 x Per Day/30 Days Secured With: Coban Self-Adherent Wrap 4x5 (in/yd) (Generic) 1 x Per Day/30 Days Discharge Instructions: Secure with Coban as directed. Secured With: [employer], 4.5x3.1 (in/yd) (Generic) 1 x Per Day/30 Days Discharge Instructions: Secure with Kerlix as directed. Secured With: 66M Medipore H Soft Cloth Surgical T ape, 4 x 10 (in/yd) (Generic) 1 x Per Day/30 Days Discharge Instructions: Secure with tape as directed. Wound #8 - Foot Wound Laterality: Right, Lateral Cleanser: Soap and Water 1 x Per Day/30 Days Discharge Instructions: May shower and wash wound with dial antibacterial soap and water prior to dressing change. Cleanser: Byram Ancillary Kit - 15 Day Supply (Generic) 1 x Per Day/30 Days Discharge Instructions: Use supplies as instructed; Kit contains: (15) Saline Bullets; (15) 3x3 Gauze; 15 pr Gloves Topical: keystone antibiotic compound 1 x Per Day/30 Days Prim Dressing: KerraCel Ag Gelling Fiber Dressing, 4x5 in (silver alginate) (Dispense As Written) 1 x Per Day/30 Days ary Discharge Instructions: Apply silver alginate to wound bed as instructed Secondary Dressing:  ABD Pad, 8x10 (Generic) 1 x Per Day/30 Days Discharge Instructions: Apply over primary dressing as directed. Secondary Dressing: NonWoven Sponge, 4x4  (in/in) (Generic) 1 x Per Day/30 Days Secured With: Coban Self-Adherent Wrap 4x5 (in/yd) (Generic) 1 x Per Day/30 Days Discharge Instructions: Secure with Coban as directed. Secured With: [employer], 4.5x3.1 (in/yd) (Generic) 1 x Per Day/30 Days Discharge Instructions: Secure with Kerlix as directed. Secured With: 37M Medipore H Soft Cloth Surgical T ape, 4 x 10 (in/yd) (Generic) 1 x Per Day/30 Days Discharge Instructions: Secure with tape as directed. Electronic Signature(s) Signed: 10/08/2021 4:33:43 PM By: [name] MD FACS Signed: 10/11/2021 4:44:20 PM By: Rhae Hammock RN Entered By: Rhae Hammock on 10/08/2021 16:06:25 -------------------------------------------------------------------------------- Problem List Details Patient Name: Date of Service: [name], [name] 10/08/2021 2:45 PM Medical Record Number: [id number] Patient Account Number: [id number] Date of Birth/Sex: Treating RN: 1985/10/16 (36 y.o. 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 08/10/2020 No Yes L97.322 Non-pressure chronic ulcer of left ankle with fat layer exposed 09/29/2021 No Yes Inactive Problems ICD-10 Code Description Active Date Inactive Date L97.518 Non-pressure chronic ulcer of other part of right foot with other specified severity 07/14/2020 07/14/2020 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, right ankle and foot  10/24/2019 10/24/2019 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, left ankle and foot 10/24/2019 10/24/2019 B95.62 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 10/24/2019 10/24/2019 classified elsewhere Resolved Problems Electronic Signature(s) Signed: 10/08/2021 3:42:58 PM By: [name] MD FACS Entered By: [name] on 10/08/2021 15:42:58 -------------------------------------------------------------------------------- Progress Note Details Patient Name: Date of Service: [name], [name] 10/08/2021 2:45 PM Medical Record Number: [id number] Patient Account Number: [id number] Date of Birth/Sex: Treating RN: 1985/10/01 (36 y.o. 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 He comes in for review of a wound over the plantar fifth metatarsal head extending into the lateral part of the foot. He was followed for this previously by his podiatrist Dr. Cornelius Moras. As the patient tells his story he went to see podiatry first for a swelling he developed on the lateral part of his fifth metatarsal head in May. He states this was "open" by podiatry and the area closed. He was followed up in June and it was again opened callus removed and it closed promptly. There were plans being made for surgery on the fifth metatarsal head in June however his blood sugar was  apparently too high for anesthesia. Apparently the area was debrided and opened again in June and it is never closed since. Looking over  the records from podiatry I am really not able to follow this. It was clear when he was first seen it was before 5/14 at that point he already had a wound. By 5/17 the ulcer was resolved. I do not see anything about a procedure. On 5/28 noted to have pre-ulcerative moderate keratosis. X-ray noted 1/5 contracted toe and tailor's bunion and metatarsal deformity. On a visit date on 09/28/2018 the dorsal part of the left foot it healed and resolved. There was concern about swelling in his lower extremity he was sent to the ER.. As far as I can tell he was seen in the ER on 7/12 with an ulcer on his left foot. A DVT rule out of the left leg was negative. I do not think I have complete records from podiatry but I am not able to verify the procedures this patient states he had. He states after the last procedure the wound has never closed although I am not able to follow this in the records I have from podiatry. He has not had a recent x-ray The patient has been using Neosporin on the wound. He is wearing a Darco shoe. He is still very active up on his foot working and exercising. Past medical history; type 2 diabetes ketosis-prone, leg swelling with a negative DVT study in July. Non-smoker ABI in our clinic was 0.85 on the left 10/16; substantial wound on the plantar left fifth met head extending laterally almost to the dorsal fifth MTP. We have been using silver alginate we gave him a Darco forefoot off loader. An x-ray did not show evidence of osteomyelitis did note soft tissue emphysema which I think was due to gas tracking through an open wound. There is no doubt in my mind he requires an MRI 10/23; MRI not booked until 3 November at the earliest this is largely due to his glucose sensor in the right arm. We have been using silver alginate. There has been an  improvement 10/29; I am still not exactly sure when his MRI is booked for. He says it is the third but it is the 10th in epic. This definitely needs to be done. He is running a low-grade fever today but no other symptoms. No real improvement in the 1 02/26/2019 patient presents today for a follow-up visit here in our clinic he is last been seen in the clinic on October 29. Subsequently we were working on getting MRI to evaluate and see what exactly was going on and where we would need to go from the standpoint of whether or not he had osteomyelitis and again what treatments were going be required. Subsequently the patient ended up being admitted to the hospital on 02/07/2019 and was discharged on 02/14/2019. This is a somewhat interesting admission with a discharge diagnosis of pneumonia due to COVID-19 although he was positive for COVID-19 when tested at the urgent care but negative x2 when he was actually in the hospital. With that being said he did have acute respiratory failure with hypoxia and it was noted he also have a left foot ulceration with osteomyelitis. With that being said he did require oxygen for his pneumonia and I level 4 L. He was placed on antivirals and steroids for the COVID-19. He was also transferred to the [facility] at one point. Nonetheless he did subsequently discharged home and since being home has done much better in that regard. The CT angiogram did not show any pulmonary embolism. With regard to the  osteomyelitis the patient was placed on vancomycin and Zosyn while in the hospital but has been changed to Augmentin at discharge. It was also recommended that he follow- up with wound care and podiatry. Podiatry however wanted him to see [name] according to the patient prior to them doing anything further. His hemoglobin A1c was 9.9 as noted in the hospital. Have an MRI of the left foot performed while in the hospital on 02/04/2019. 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 There is an overlying plantar open wound noted an abscess tracking back along the lateral aspect of the fifth metatarsal shaft. There is otherwise diffuse cellulitis and mild fasciitis without findings of polymyositis. The patient did have recently pneumonia secondary to COVID-19 I looked in the chart through epic and it does appear that the patient may need to have an additional x-ray just to ensure everything is cleared and that he has no airspace disease prior to putting him into the chamber. 03/05/2019; patient was readmitted to the clinic last week. He was hospitalized twice for a viral upper respiratory tract infection from 11/1 through 11/4 and then 11/5 through 11/12 ultimately this turned out to be Covid pneumonitis. Although he was discharged on oxygen he is not using it. He says he feels fine. He has no exercise limitation no cough no sputum. His O2 sat in our clinic today was 100% on room air. 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 He received Vanco and Zosyn in the hospital and then was discharged on 2 weeks of Augmentin. I do not see any relevant cultures. He was supposed to follow-up with infectious disease but I do not see that he has an appointment. 12/8; patient saw Dr. Novella Olive of infectious disease last week. He felt that he had had adequate antibiotic therapy. He did not go to follow-up with Dr. Amalia Hailey of podiatry and I have again talked to him about the pros and cons of this. He does not want to consider a ray amputation of this time. He is aware of the risks of recurrence, migration etc. He started HBO today and tolerated this well. He can complete the Augmentin that I gave him last week. I have looked over the lab work that Dr. Chana Bode ordered his C-reactive protein  was 3.3 and his sedimentation rate was 17. The C-reactive protein is never really been measurably that high in this patient 12/15; not much change in the wound today however he has undermining along the lateral part of the foot again more extensively than last week. He has some rims of epithelialization. We have been using silver alginate. He is undergoing hyperbarics but did not dive today 12/18; in for his obligatory first total contact cast change. Unfortunately there was pus coming from the undermining area around his fifth metatarsal head. This was cultured but will preclude reapplication of a cast. He is seen in conjunction with HBO 12/24; patient had staph lugdunensis in the wound in the undermining area laterally last time. We put him on doxycycline which should have covered this. The wound looks better today. I am going to give him another week of doxycycline before reattempting the total contact cast 12/31; the patient is completing antibiotics. Hemorrhagic debris in the distal part of the wound with some undermining distally. He also had hyper granulation.  Extensive debridement with a #5 curette. The infected area that was on the lateral part of the fifth met head is closed over. I do not think he needs any more antibiotics. Patient was seen prior to HBO. Preparations for a total contact cast were made in the cast will be placed post hyperbarics 04/11/19; once again the patient arrives today without complaint. He had been in a cast all week noted that he had heavy drainage this week. This resulted in large raised areas of macerated tissue around the wound 1/14; wound bed looks better slightly smaller. Hydrofera Blue has been changing himself. He had a heavy drainage last week which caused a lot of maceration around the wound so I took him out of a total contact cast he says the drainage is actually better this week He is seen today in conjunction with HBO 1/21; returns to clinic. He was up  in [place] for a day or 2 attending a funeral. He comes back in with the wound larger and with a large area of exposed bone. He had osteomyelitis and septic arthritis of the fifth left metatarsal head while he was in hospital. He received IV antibiotics in the hospital for a prolonged period of time then 3 weeks of Augmentin. Subsequently I gave him 2 weeks of doxycycline for more superficial wound infection. When I saw this last week the wound was smaller the surface of the wound looks satisfactory. 1/28; patient missed hyperbarics today. Bone biopsy I did last time showed Enterococcus faecalis and Staphylococcus lugdunensis . He has a wide area of exposed bone. We are going to use silver alginate as of today. I had another ethical discussion with the patient. 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 In this situation I think the likelihood of healing this is low. Therefore I have recommended a ray amputation and with the patient's agreement I have referred him to Dr. Doran Durand. The other issue is that his compliance with hyperbarics has been minimal because of his work schedule and given his underlying decision I am going to stop this today READMISSION 10/24/2019 MRI 09/29/2019 left foot IMPRESSION: 1. Apparent skin ulceration inferior and lateral to the 5th metatarsal base with underlying heterogeneous T2 signal and enhancement in the subcutaneous fat. Small peripherally enhancing fluid collections along the plantar and lateral aspects of the 5th metatarsal base suspicious for abscesses. 2. Interval amputation through the mid 5th metatarsal with nonspecific low-level marrow edema and enhancement. Given the proximity to the adjacent soft tissue inflammatory changes, osteomyelitis cannot be excluded. 3. The additional bones appear unremarkable. MRI 09/29/2019 right foot IMPRESSION: 1. Soft tissue ulceration lateral to the 5th MTP joint. There is low-level T2  hyperintensity within the 4th and 5th metatarsal heads and adjacent proximal phalanges without abnormal T1 signal or cortical destruction. These findings are nonspecific and could be seen with early marrow edema, hyperemia or early osteomyelitis. No evidence of septic joint. 2. Mild tenosynovitis and synovial enhancement associated with the extensor digitorum tendons at the level of the midfoot. 3. Diffuse low-level muscular T2 hyperintensity and enhancement, most consistent with diabetic myopathy. LEFT FOOT BONE Methicillin resistant staphylococcus aureus Staphylococcus lugdunensis MIC MIC CIPROFLOXACIN >=8 RESISTANT Resistant <=0.5 SENSI... Sensitive CLINDAMYCIN <=0.25 SENS... Sensitive >=8 RESISTANT Resistant ERYTHROMYCIN >=8 RESISTANT Resistant >=8 RESISTANT Resistant GENTAMICIN <=0.5 SENSI... Sensitive <=0.5 SENSI... Sensitive Inducible Clindamycin NEGATIVE Sensitive NEGATIVE Sensitive OXACILLIN >=4 RESISTANT Resistant 2 SENSITIVE Sensitive RIFAMPIN <=0.5 SENSI... Sensitive <=0.5 SENSI... Sensitive TETRACYCLINE <=1 SENSITIVE Sensitive <=1  SENSITIVE Sensitive TRIMETH/SULFA <=10 SENSIT Sensitive <=10 SENSIT Sensitive ... Marland Kitchen.. VANCOMYCIN 1 SENSITIVE Sensitive <=0.5 SENSI... Sensitive Right foot bone . Component 3 wk ago Specimen Description BONE Special Requests RIGHT 4 METATARSAL SAMPLE B Gram Stain NO WBC SEEN NO ORGANISMS SEEN Culture RARE METHICILLIN RESISTANT STAPHYLOCOCCUS AUREUS NO ANAEROBES ISOLATED Performed at [facility], [place] [address] [place], [place] [address] Report Status 10/08/2019 FINAL Organism ID, Bacteria METHICILLIN RESISTANT STAPHYLOCOCCUS AUREUS Resulting Agency [facility] Susceptibility Methicillin resistant staphylococcus aureus MIC CIPROFLOXACIN >=8 RESISTANT Resistant CLINDAMYCIN <=0.25 SENS... Sensitive ERYTHROMYCIN >=8 RESISTANT Resistant GENTAMICIN <=0.5 SENSI... Sensitive Inducible Clindamycin NEGATIVE Sensitive OXACILLIN >=4  RESISTANT Resistant RIFAMPIN <=0.5 SENSI... Sensitive TETRACYCLINE <=1 SENSITIVE Sensitive TRIMETH/SULFA <=10 SENSIT Sensitive ... VANCOMYCIN 1 SENSITIVE Sensitive This is a patient we had in clinic earlier this year with a wound over his left fifth metatarsal head. 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 Doran Durand on 05/16/2019. He seems to have developed problems with wounds on his bilateral feet in June 2021 although he may have had problems earlier than this. He was in an urgent care with a right foot ulcer on 09/26/2019 and given a course of doxycycline. This was apparently after having trouble getting into see orthopedics. He was seen by podiatry on 09/28/2019 noted to have bilateral lower extremity ulcers including the left lateral fifth metatarsal base and the right subfifth met head. It was noted that had purulent drainage at that time. He required hospitalization from 6/20 through 7/2. This was because of worsening right foot wounds. He underwent bilateral operative incision and drainage and bone biopsies bilaterally. Culture results are listed above. He has been referred back to clinic by Dr. Jacqualyn Posey of podiatry. He is also followed by Dr. Megan Salon who saw him yesterday. He was discharged from hospital on Zyvox Flagyl and Levaquin and yesterday changed to doxycycline Flagyl and Levaquin. His inflammatory markers on 6/26 showed a sedimentation rate of 129 and a C-reactive protein of 5. This is improved to 14 and 1.3 respectively. This would indicate improvement. ABIs in our clinic today were 1.23 on the right and 1.20 on the left 11/01/2019 on evaluation today patient appears to be doing fairly well in regard to  the wounds on his feet at this point. Fortunately there is no signs of active infection at this time. No fevers, chills, nausea, vomiting, or diarrhea. He currently is seeing infectious disease and still under their care at this point. Subsequently he also has both wounds which she has not been using collagen on as he did not receive that in his packaging he did not call us and let us know that. Apparently that just was missed on the order. Nonetheless we will get that straightened out today. 8/9-Patient returns for bilateral foot wounds, using Prisma with hydrogel moistened dressings, and the wounds appear stable. Patient using surgical shoes, avoiding much pressure or weightbearing as much as possible 8/16; patient has bilateral foot wounds. 1 on the right lateral foot proximally the other is on the left mid lateral foot. Both required debridement of callus and thick skin around the wounds. We have been using silver collagen 8/27; patient has bilateral lateral foot wounds. The area on the left substantially surrounded by callus and  dry skin. This was removed from the wound edge. The underlying wound is small. The area on the right measured somewhat smaller today. 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 Unfortunately I did not update his antibiotics during today's visit. 9/10 I reviewed Dr. Hale Bogus last notes he felt he had completed antibiotics his inflammatory markers were reasonably well controlled. He has a small wound on the lateral left foot and a tiny area on the right which is just above closed. He is using Hydrofera Blue with border foam he has bilateral surgical shoes 9/24; 2 week f/u. doing well. right foot is closed. left foot still undermined. 10/14; right foot remains closed at the fifth met head. The area over the base of the left fifth metatarsal has a small open area but considerable undermining towards the  plantar foot. Thick callus skin around this suggests an adequate pressure relief. We have talked about this. He says he is going to go back into his cam boot. I suggested a total contact cast he did not seem enamored with this suggestion 10/26; left foot base of the fifth metatarsal. Same condition as last time. He has skin over the area with an open wound however the skin is not adherent. He went to see Dr. Earleen Newport who did an x-ray and culture of his foot I have not reviewed the x-ray but the patient was not told anything. He is on doxycycline 11/11; since the patient was last here he was in the emergency room on 10/30 he was concerned about swelling in the left foot. They did not do any cultures or x-rays. They changed his antibiotics to cephalexin. Previous culture showed group B strep. The cephalexin is appropriate as doxycycline has less than predictable coverage. Arrives in clinic today with swelling over this area under the wound. He also has a new wound on the right fifth metatarsal head 11/18; the patient has a difficult wound on the lateral aspect of the left fifth metatarsal head. The wound was almost ballotable last week I opened it slightly expecting to see purulence however there was just bleeding. I cultured this this was negative. X-ray unchanged. We are trying to get an MRI but I am not sure were going to be able to get this through his insurance. He also has an area on the right lateral fifth metatarsal head this looks healthier 12/3; the patient finally got our MRI. Surprisingly this did not show osteomyelitis. I did show the soft tissue ulceration at the lateral plantar aspect of the fifth metatarsal base with a tiny residual 6 mm abscess overlying the superficial fascia I have tried to culture this area I have not been able to get this to grow anything. Nevertheless the protruding tissue looks aggravated. I suspect we should try to treat the underlying "abscess with broad-spectrum  antibiotics. I am going to start him on Levaquin and Flagyl. He has much less edema in his legs and I am going to continue to wrap his legs and see him weekly 12/10. I started Levaquin and Flagyl on him last week. He just picked up the Flagyl apparently there was some delay. The worry is the wound on the left fifth metatarsal base which is substantial and worsening. His foot looks like he inverts at the ankle making this a weightbearing surface. Certainly no improvement in fact I think the measurements of this are somewhat worse. We have been using 12/17; he apparently just  got the Levaquin yesterday this is 2 weeks after the fact. He has completed the Flagyl. The area over the left fifth metatarsal base still has protruding granulation tissue although it does not look quite as bad as it did some weeks ago. He has severe bilateral lymphedema although we have not been treating him for wounds on his legs this is definitely going to require compression. There was so much edema in the left I did not wish to put him in a total contact cast today. I am going to increase his compression from 3-4 layer. The area on the right lateral fifth met head actually look quite good and superficial. 12/23; patient arrived with callus on the right fifth met head and the substantial hyper granulated callused wound on the base of his fifth metatarsal. He says he is completing his Levaquin in 2 days but I do not think that adds up with what I gave him but I will have to double check this. We are using Hydrofera Blue on both areas. My plan is to put the left leg in a cast the week after New Year's 04/06/2020; patient's wounds about the same. Right lateral fifth metatarsal head and left lateral foot over the base of the fifth metatarsal. There is undermining on the left lateral foot which I removed before application of total contact cast continuing with Hydrofera Blue new. Patient tells me he was seen by endocrinology today  lab work was done [Dr. Kerr]. Also wondering whether he was referred to cardiology. I went over some lab work from previously does not have chronic renal failure certainly not nephrotic range proteinuria he does have very poorly controlled diabetes but this is not his most updated lab work. Hemoglobin A1c has been over 11 1/10; the patient had a considerable amount of leakage towards mid part of his left foot with macerated skin however the wound surface looks better the area on the right lateral fifth met head is better as well. I am going to change the dressing on the left foot under the total contact cast to silver alginate, continue with Hydrofera Blue on the right. 1/20; patient was in the total contact cast for 10 days. Considerable amount of drainage although the skin around the wound does not look too bad on the left foot. The area on the right fifth metatarsal head is closed. Our nursing staff reports large amount of drainage out of the left lateral foot wound 1/25; continues with copious amounts of drainage described by our intake staff. PCR culture I did last week showed E. coli and Enterococcus faecalis and low quantities. Multiple resistance genes documented including extended spectrum beta lactamase, MRSA, MRSE, quinolone, tetracycline. The wound is not quite as good this week as it was 5 days ago but about the same size 2/3; continues with copious amounts of malodorous drainage per our intake nurse. The PCR culture I did 2 weeks ago showed E. coli and low quantities of Enterococcus. There were multiple resistance genes detected. I put Neosporin on him last week although this does not seem to have helped. The wound is slightly deeper today. Offloading continues to be an issue here although with the amount of drainage she has a total contact cast is just not going to work 2/10; moderate amount of drainage. Patient reports he cannot get his stocking on over the dressing. I told him we have  to do that the nurse gave him suggestions on how to make this work. The wound is  on the bottom and lateral part of his left foot. Is cultured predominantly grew low amounts of Enterococcus, E. coli and anaerobes. There were multiple resistance genes detected including extended spectrum beta lactamase, quinolone, tetracycline. I could not think of an easy oral combination to address this so for now I am going to do topical antibiotics provided by [facility] I think the main agents here are vancomycin and an aminoglycoside. We have to be able to give him access to the wounds to get the topical antibiotic on 2/17; moderate amount of drainage this is unchanged. He has his Keystone topical antibiotic against the deep tissue culture organisms. He has been using this and changing the dressing daily. Silver alginate on the wound surface. 2/24; using Keystone antibiotic with silver alginate on the top. He had too much drainage for a total contact cast at one point although I think that is improving and I think in the next week or 2 it might be possible to replace a total contact cast I did not do this today. In general the wound surface looks healthy however he continues to have thick rims of skin and subcutaneous tissue around the wide area of the circumference which I debrided 06/04/2020 upon evaluation today patient appears to be doing well in regard to his wound. I do feel like he is showing signs of improvement. There is little bit of callus and dead tissue around the edges of the wound as well as what appears to be a little bit of a sinus tract that is off to the side laterally I would perform debridement to clear that away today. 3/17; left lateral foot. The wound looks about the same as I remember. Not much depth surface looks healthy. No evidence of infection 3/25; left lateral foot. Wound surface looks about the same. Separating epithelium from the circumference. There really is no evidence of infection  here however not making progress by my view 3/29; left lateral foot. Surface of the wound again looks reasonably healthy still thick skin and subcutaneous tissue around the wound margins. There is no evidence of infection. One of the concerns being brought up by the nurses has again the amount of drainage vis--vis continued use of a total contact cast 4/5; left lateral foot at roughly the base of the fifth metatarsal. Nice healthy looking granulated tissue with rims of epithelialization. The overall wound measurements are not any better but the tissue looks healthy. The only concern is the amount of drainage although he has no surrounding maceration with what we have been doing recently to absorb fluid and protect his skin. He also has lymphedema. 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 He states that the kerlix/coban under the TCC rolled down and could not get it back up. He has been using an offloading scooter and has somehow hurt his right foot using this device. This happened last week. He states that the side of his right foot developed a blister and opened. The top of his foot also has a few small open wounds he thinks is due to his socks rubbing in his shoes. He has not been using any dressings to the wound. He denies purulent drainage, fever/chills or erythema to the wounds. 4/22; patient  presents for 1 week follow-up. He developed new wounds to the right foot that were evaluated at last clinic visit. He continues to have a total contact cast to the left leg and he reports no issues. He has been using silver collagen to the right foot wounds with no issues. He denies purulent drainage, fever/chills or erythema to the right foot wounds. He has  no complaints today 4/25; patient presents for 1 week follow-up. He has a total contact cast of the left leg and reports no issues. He has been using silver alginate to the right foot wound. He denies purulent drainage, fever/chills or erythema to the right foot wounds. 5/2 patient presents for 1 week follow-up. T contact cast on the left. The wound which is on the base of the plantar foot at the base of the fifth metatarsal otal actually looks quite good and dimensions continue to gradually contract. HOWEVER the area on the right lateral fifth metatarsal head is much larger than what I remember from 2 weeks ago. Once more is he has significant levels of hypergranulation. Noteworthy that he had this same hyper granulated response on his wound on the left foot at one point in time. So much so that he I thought there was an underlying fluid collection. Based on this I think this just needs debridement. 5/9; the wound on the left actually continues to be gradually smaller with a healthy surface. Slight amount of drainage and maceration of the skin around but not too bad. However he has a large wound over the right fifth metatarsal head very much in the same configuration as his left foot wound was initially. I used silver nitrate to address the hyper granulated tissue no mechanical debridement 5/16; area on the left foot did not look as healthy this week deeper thick surrounding macerated skin and subcutaneous tissue. oo The area on the right foot fifth met head was about the same oo The area on the right ankle that we identified last week is completely broken down into an open wound presumably a stocking rubbing issue 5/23; patient has been using a total contact cast to the left side. He has been using silver alginate underneath. He has also been using silver alginate to the right foot wounds. He has no complaints today. He denies any signs of infection. 5/31; the left-sided wound looks some better  measure smaller surface granulation looks better. We have been using silver alginate under the total contact cast oo The large area on his right fifth met head and right dorsal foot look about the same still using silver alginate 6/6; neither side is good as I was hoping although the surface area dimensions are better. A lot of maceration on his left and right foot around the wound edge. Area on the dorsal right foot looks better. He says he was traveling. I am not sure what does the amount of maceration around the plantar wounds may be drainage issues 6/13; in general the wound surfaces look quite good on both sides. Macerated skin and raised edges around the wound required debridement although in general especially on the left the surface area seems improved. oo The area on the right dorsal ankle is about the same I thought this would not be such a problem to close 6/20; not much change in either wound although the one on the right looks a little better. Both wounds have thick macerated edges to the skin requiring debridements. We have been using silver alginate. The area  on the dorsal right ankle is still open I thought this would be closed. 6/28; patient comes in today with a marked deterioration in the right foot wound fifth met head. Wide area of exposed bone this is a drastic change from last time. The area on the left there we have been casting is stagnant. We have been using silver alginate in both wound areas. 7/5; bone culture I did for PCR last time was positive for Pseudomonas, group B strep, Enterococcus and Staph aureus. There was no suggestion of methicillin resistance or ampicillin resistant genes. This was resistant to tetracycline however He comes into the clinic today with the area over his right plantar fifth metatarsal head which had been doing so well 2 weeks ago completely necrotic feeling bone. I do not know that this is going to be salvageable. The left foot wound is  certainly no smaller but it has a better surface and is superficial. 7/8; patient called in this morning to say that his total contact cast was rubbing against his foot. He states he is doing fine overall. He denies signs of infection. 7/12; continued deterioration in the wound over the right fifth metatarsal head crumbling bone. This is not going to be salvageable. The patient agrees and wants to be referred to Dr. Doran Durand which we will attempt to arrange as soon as possible. I am going to continue him on antibiotics as long as that takes so I will renew those today. The area on the left foot which is the base of the fifth metatarsal continues to look somewhat better. Healthy looking tissue no depth no debridement is necessary here. 7/20; the patient was kindly seen by Dr. Doran Durand of orthopedics on 10/19/2020. 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 Towards this end we have taken him out of the total contact cast on the left we will put him in a wrap with Hydrofera Blue. As I understand things surgery is planned for 7/21 7/27; patient had his surgery last Thursday. 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 Apparently everything went well we did not still disturb that today The area on the left foot actually looks quite good. He has been much less mobile which probably explains this he did not seem to do well in the total contact cast secondary to drainage and maceration I think. 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 Fortunately there is no evidence of active infection at this time. No fevers, chills, nausea, vomiting, or diarrhea. Overall I think that he is actually doing extremely well. Nonetheless I do believe that he is staying off of this more following the surgery in his right foot that is  the reason the left is doing so great. 8/16; left plantar foot wound. This looks smaller than the last time I saw this he is using Hydrofera Blue. The surgical wound on the right foot is being followed by Dr. Doran Durand we did not look at this today. He has surgical shoes on both feet 8/23; left plantar foot wound not as good this week. Surrounding macerated skin and subcutaneous tissue everything looks moist and wet. I do not think he is offloading this adequately. He is using a surgical shoe Apparently the right foot surgical wound is not open although I  did not check his foot 8/31; left plantar foot lateral aspect. Much improved this week. He has no maceration. Some improvement in the surface area of the wound but most impressively the depth is come in we are using silver alginate. The patient is a Product/process development scientist. He is asked that we write him a letter so he can go back to work. I have also tried to see if we can write something that will allow him to limit the amount of time that he is on his foot at work. 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 Hopefully the latter can be avoided 9/6; I believe the patient missed an appointment last week. He arrives in today with a wound looking roughly the same certainly no better. Undermining laterally and also inferiorly. We used molecuLight today in training with the patient's permission.. We are using silver alginate 9/21 wound is measuring bigger this week although this may have to do with the aggressive circumferential debridement last week in response to the blush fluorescence on the MolecuLight. Culture I did last week showed significant MSSA and E. coli. I put him on Augmentin but he has not started it yet. We are also going to send this for compounded antibiotics at [facility]. There is no evidence of systemic infection 9/29; silver alginate. His Keystone arrived. He  is completing Augmentin in 2 days. Offloading in a cam boot. Moderate drainage per our intake staff 10/5; using silver alginate. He has been using his [place]. He has completed his Augmentin. Per our intake nurse still a lot of drainage, far too much to consider a total contact cast. Wound measures about the same. He had the same undermining area that I defined last week from a roughly 11-3. I remove this today 10/12; using silver alginate he is using the [place]. He comes in for a nurse visit hence we are applying [employer] twice a week. Measuring slightly better today and less notable drainage. Extensive debridement of the wound edge last time 10/18; using topical Keystone and silver alginate and a soft cast. Wound measurements about the same. Drainage was through his soft cast. We are changing this twice a week Tuesdays and Friday 10/25; comes in with moderate drainage. Still using Keystone silver alginate and a soft cast. Wound dimensions completely the same.He has a lot of edema in the left leg he has lymphedema. Asking for [name] to consider wrapping him as he cannot get his stocking on over the soft cast 11/2; comes in with moderate to large drainage slightly smaller in terms of width we have been using [place]. His wound looks satisfactory but not much improvement 11/4; patient presents today for obligatory cast change. Has no issues or complaints today. He denies signs of infection. 11/9; patient traveled this weekend to [place], was on the cast quite a bit. Staining of the cast with black material from his walking boot. Drainage was not quite as bad as we feared. Using silver alginate and Keystone 11/16; we do not have size for cast therefore we have been putting a soft cast on him since the change on Friday. Still a significant amount of drainage necessitating changing twice a week. We have been using the Keystone at cast changes either hard or soft as well as silver alginate Comes in the clinic  with things actually looking fairly good improvement in width. He says his offloading is about the same  02/24/2021 upon evaluation today patient actually comes back in and is doing excellent in regard to his foot ulcer this is significantly smaller even compared to the last visit. The soft cast seems to have done extremely well for him which is great news. I do not see any signs of infection minimal debridement will be needed today. 11/30; left lateral foot much improved half a centimeter improvement in surface area. No evidence of infection. He seems to be doing better with the soft cast in the TCC therefore we will continue with this. He comes back in later in the week for a change with the nurses. This is due to drainage 12/6; no improvement in dimensions. Under illumination some debris on the surface we have been using silver alginate, soft cast. If there is anything optimistic here he seems to have have less drainage 12/13. Dimensions are improved both length and width and slightly in depth. Appears to be quite healthy today. Raised edges of this thick skin and callus around the edges however. He is in a soft cast were bringing him back once for a change on Friday. Drainage is better 12/20. Dimensions are improved. He still has raised edges of thick skin and callus around the edges. We are using a soft cast 12/28; comes in today with thick callus around the wound. Using silver under alginate under a soft cast. I do not think there is much improvement in any measurement 2023 04/06/2021; patient was put in a total contact cast. Unfortunately not much change in surface area 1/10; not much different still thick callus and skin around the edge in spite of the total contact cast. This was just debrided last week we have been using the [facility] compounded antibiotic and silver alginate under a total contact cast 1/18 the patient's wound on the left side is doing nicely. smaller HOWEVER he comes in  today with a wound on the right foot laterally. blister most likely serosangquenous drainage 1/24; the patient continues to do well in terms of the plantar left foot which is continued to contract using silver alginate under the total contact cast HOWEVER the right lateral foot is bigger with denuded skin around the edges. I used pickups and a #15 scalpel to remove this this looks like the remanence of a large blister. Cannot rule out infection. Culture in this area I did last week showed Staphylococcus lugdunensis few colonies. I am going to try to address this with his [employer] antibiotic that is done so well on the left having linezolid and this should cover the staph 2/1; the patient's wound on his left foot which was the original plantar foot wound thick skin and eschar around the edges even in the total contact cast but the wound surface does not look too bad The real problem is on how his right lateral foot at roughly the base of the fifth metatarsal. The wound is completely necrotic more worrisome than that there is swelling around the edges of this. We have been using silver alginate on both wounds and Keystone on the right foot. Unfortunately I think he is going to require systemic antibiotics while we await cultures. He did not get the x-ray done that we ordered last week [lost the prescription 2/7; disappointingly in the area on the left foot which we are treating with a total contact cast is still not closed although it is much smaller. He continues to have a lot of callus around the wound edge. -Right lateral foot culture I  did last week was negative x-ray also negative for osteomyelitis. 2/15: TCC silver alginate on the left and silver alginate on the right lateral. No real improvement in either area 05/26/2021: T oday, the wounds are roughly the same size as at his previous visit, post-debridement. He continues to endorse fairly substantial drainage, particularly on the right. He has  been in a total contact cast on the left. There is still some callus surrounding this lesion. On the right, the periwound skin is quite macerated, along with surrounding callus. The center of the right-sided wound also has some dark, densely adherent material, which is very difficult to remove. 06/02/2021: Today, both wounds are slightly smaller. He has been using zinc oxide ointment around the right ulcer and the degree of maceration has improved markedly. There continues to be an area of nonviable tissue in the center of the right sided ulcer. The left-sided wound, which has been in the total contact cast. Appears clean and the degree of callus around it is less than previously. 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 He had to take the stairs and both wounds have increased in size. The left foot, which has been in a total contact cast, has developed a tunnel tracking to the lateral aspect of his foot. The nonviable tissue in the center of the right-sided ulcer remains recalcitrant to debridement. There is significant undermining surrounding the entirety of the left sided wound. 06/16/2021: The elevator at school has been fixed and the patient has been able to avoid putting as much weight on his wounds over the past week. We converted the left foot wound into a single lesion today, but despite this, the wound is actually smaller. The base is healthy with limited periwound callus. On the right, the central necrotic area is still present. He continues to be quite macerated around the right sided wound, despite applying barrier cream. This does, however, have the benefit of softening the callus to make it more easily removable. 06/23/2021: Today, the left wound is smaller. The lateral aspect that had opened up previously is now closed. The wound base has a healthy bed of granulation tissue and minimal slough. Unfortunately, on the right,  the wound is larger and continues to be fairly macerated. He has also reopened the wound at his right ankle. He thinks this is due to the gait he has adopted secondary to his total contact cast and boot. 06/30/2021: T oday, both wounds are a little bit larger. The lateral aspect on the left has remained closed. He continues to have significant periwound maceration. The culture that I took from the right sided wound grew a population of bacteria that is not covered by his current [facility] antibiotic. The center of the right- sided wound continues to appear necrotic with nonviable fat. It probes deeper today, but does not reach bone. 07/07/2021: The periwound maceration is a little bit less today. The right lateral foot wound has some areas that appear more viable and the necrotic center also looks a little bit better. The wound on the dorsal surface of his right foot near the ankle is contracting and the surface appears healthy. The left plantar wound surface looks healthy, but there is some new undermining on the medial portion. He did get his new Keystone antibiotic and began applying that to the right foot wound on Saturday. 07/14/2021: The intake nurse reported substantial drainage from his wounds, but the periwound skin actually  looks better than is typical for him. The wound on the dorsal surface of his right foot near the ankle is smaller and just has a small open area underneath some dried eschar. The left plantar wound surface looks healthy and there has been no significant accumulation of callus. The right lateral foot wound looks quite a bit better, with the central portion, which typically appears necrotic, looking more viable albeit pale. 07/22/2021: His left foot is extremely macerated today. The wound is about the same size. The wound on the dorsal surface of his right foot near the ankle had closed, but he traumatized it removing the dressing and there is a tiny skin tear in that location.  The right lateral foot wound is bigger, but the surface appears healthy. 07/30/2021: The wound on the dorsal surface of his right foot near the ankle is closed. The right lateral foot wound again is a little bit bigger due to some undermining. The periwound skin is in better condition, however. He has been applying zinc oxide. The wound surface is a little bit dry today. On the left, he does not have the substantial maceration that we frequently see. The wound itself is smaller and has a clean surface. 08/06/2021: Both wounds seem to have deteriorated over the past week. The right lateral foot wound has a dry surface but the periwound is boggy.. Overall wound dimensions are about the same. On the left, the wound is about the same size, but there is more undermining present underneath periwound callus. 08/13/2021: The right sided wound looks about the same, but on the left there has been substantial deterioration. The undermining continues to extend under periwound callus. Once this was removed, substantial extension of the wound was present. There is no odor or purulent drainage but clearly the wounds have broken down. 08/20/2021: The wounds look about the same today. He has been out of his total contact cast and has just been changing the dressings using topical Keystone with PolyMem Ag, Kerlix and Ace bandages. The wound on the top of his right ankle has reopened but this is quite small. There was a little bit of purulent material that I expressed when examining this wound. 08/24/2021: After the aggressive debridement I performed at his last visit, the wounds actually look a little bit better today. They are smaller with the exception of the wound on the top of his right ankle which is a little bit bigger as some more skin pulled off when he was changing his dressing. We are using topical Keystone with PolyMem Ag Kerlix and Ace bandages. 09/02/2021: There has been really no change to any of his  wounds. 09/16/2021: The patient was hospitalized last week with nausea, vomiting, and dehydration. He says that while he was in the hospital, his wounds were not really addressed properly. T oday, both plantar foot wounds are larger and the periwound skin is macerated. The wound on the dorsum of his right foot has a scab on the top. The right foot now has a crater where previously he had had nonviable fat. It looks as though this simply died and fell out. The periwound callus is wet. 09/24/2021: His wounds have deteriorated somewhat since his last visit. The wound on the dorsum of his right foot near his ankle is larger and has more nonviable tissue present. The crater in his right foot is even deeper; I cannot quite palpate or probe to bone but I am sure it is close. The wound on his  left plantar foot has an odd boggy area in the center that almost feels as though it has fluid within it. He has run out of his topical Keystone antibiotic. We are using silver alginate on his wounds. 09/29/2021: He has developed a new wound on the dorsum of his left foot near his ankle. He says he thinks his wrapping is rubbing in that site. I would concur with this as the wound on his right ankle is larger. The left foot looks about the same. The right foot has the crater that was present last week. No significant slough accumulation, but his foot remains quite swollen and warm despite oral antibiotic therapy. 10/08/2021: All of his wounds look about the same as last week. He did not start his oral antibiotics that are prescribed until just a couple of days ago; his [employer] compounded antibiotics formula has been changed and he is awaiting delivery of the new recipe. His MRI that was scheduled for earlier this week was canceled as no prior authorization had been obtained; unfortunately the tech responsible sent an email to my old [place] email, which I no longer use nor have access to. Patient History Information  obtained from Patient. Family History No family history of Cancer, Diabetes, Hereditary Spherocytosis, Hypertension, Kidney Disease, Lung Disease, Seizures, Stroke, Thyroid Problems, Tuberculosis. Social History Never smoker, Marital Status - Single, Alcohol Use - Rarely, Drug Use - No History, Caffeine Use - Never. Medical History Eyes Denies history of Cataracts, Glaucoma, Optic Neuritis Ear/Nose/Mouth/Throat Denies history of Chronic sinus problems/congestion, Middle ear problems Hematologic/Lymphatic Denies history of Anemia, Hemophilia, Human Immunodeficiency Virus, Lymphedema, Sickle Cell Disease Respiratory Denies history of Aspiration, Asthma, Chronic Obstructive Pulmonary Disease (COPD), Pneumothorax, Sleep Apnea, Tuberculosis Cardiovascular Denies history of Angina, Arrhythmia, Congestive Heart Failure, Coronary Artery Disease, Deep Vein Thrombosis, Hypertension, Hypotension, Myocardial Infarction, Peripheral Arterial Disease, Peripheral Venous Disease, Phlebitis, Vasculitis Gastrointestinal Denies history of Cirrhosis , Colitis, Crohnoos, Hepatitis A, Hepatitis B, Hepatitis C Endocrine Patient has history of Type II Diabetes Denies history of Type I Diabetes Immunological Denies history of Lupus Erythematosus, Raynaudoos, Scleroderma Integumentary (Skin) Denies history of History of Burn Musculoskeletal Denies history of Gout, Rheumatoid Arthritis, Osteoarthritis, Osteomyelitis Neurologic Denies history of Dementia, Neuropathy, Quadriplegia, Paraplegia, Seizure Disorder Oncologic Denies history of Received Chemotherapy, Received Radiation Psychiatric Denies history of Anorexia/bulimia, Confinement Anxiety Hospitalization/Surgery History - 11/1-11/06/2018- sepsis foot infection. - 11/4-11/5 02 sats low respiratory distress. Objective Constitutional No acute distress.. Vitals Time Taken: 3:04 PM, Height: 77 in, Weight: 280 lbs, BMI: 33.2, Temperature: 97.6 F, Pulse:  94 bpm, Respiratory Rate: 18 breaths/min, Blood Pressure: 136/86 mmHg. Respiratory Normal work of breathing on room air.. General Notes: 10/08/2021: His wounds are basically unchanged from last week. The warmth and erythema on the right foot seems slightly improved, but the foot and leg are still more edematous than the left. Integumentary (Hair, Skin) Wound #11 status is Open. Original cause of wound was Gradually Appeared. The date acquired was: 08/20/2021. The wound has been in treatment 7 weeks. The wound is located on the [place]. The wound measures 3cm length x 2.5cm width x 0.1cm depth; 5.89cm^2 area and 0.589cm^3 volume. There is Fat Layer (Subcutaneous Tissue) exposed. There is no tunneling or undermining noted. There is a medium amount of serosanguineous drainage noted. The wound margin is distinct with the outline attached to the wound base. There is small (1-33%) red, pink granulation within the wound bed. There is a large (67-100%) amount of necrotic tissue within the  wound bed including Eschar and Adherent Slough. Wound #12 status is Open. Original cause of wound was Gradually Appeared. The date acquired was: 09/29/2021. The wound has been in treatment 1 weeks. The wound is located on the Left,Dorsal Foot. The wound measures 1.3cm length x 0.8cm width x 0.1cm depth; 0.817cm^2 area and 0.082cm^3 volume. There is no tunneling or undermining noted. There is a medium amount of serosanguineous drainage noted. The wound margin is distinct with the outline attached to the wound base. There is small (1-33%) red granulation within the wound bed. There is a large (67-100%) amount of necrotic tissue within the wound bed including Eschar and Adherent Slough. Wound #3 status is Open. Original cause of wound was Trauma. The date acquired was: 10/02/2019. The wound has been in treatment 102 weeks. The wound is located on the [place]. The wound measures 3cm length x 3.3cm  width x 0.2cm depth; 7.775cm^2 area and 1.555cm^3 volume. There is Fat Layer (Subcutaneous Tissue) exposed. There is no tunneling or undermining noted. There is a medium amount of serosanguineous drainage noted. The wound margin is well defined and not attached to the wound base. There is large (67-100%) pink granulation within the wound bed. There is a small (1-33%) amount of necrotic tissue within the wound bed including Adherent Slough. Wound #8 status is Open. Original cause of wound was Blister. The date acquired was: 04/18/2021. The wound has been in treatment 24 weeks. The wound is located on the Right,Lateral Foot. The wound measures 4.5cm length x 3.3cm width x 1.5cm depth; 11.663cm^2 area and 17.495cm^3 volume. There is undermining starting at 12:00 and ending at 2:00 with a maximum distance of 1.6cm. There is a large amount of serosanguineous drainage noted. The wound margin is well defined and not attached to the wound base. There is large (67-100%) red, pink granulation within the wound bed. There is a small (1-33%) amount of necrotic tissue within the wound bed including Adherent Slough. 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 There was a Excisional Skin/Subcutaneous Tissue Debridement with a total area of 7.5 sq cm performed by [name], MD. With the following instrument(s): Curette to remove Viable and Non-Viable tissue/material. Material  removed includes Eschar, Subcutaneous Tissue, and Slough after achieving pain control using Lidocaine. No specimens were taken. A time out was conducted at 15:30, prior to the start of the procedure. A Minimum amount of bleeding was controlled with Pressure. The procedure was tolerated well with a pain level of 0 throughout and a pain level of 0 following the procedure. Post Debridement Measurements: 3cm length x 2.5cm width x 0.1cm depth; 0.589cm^3 volume. Character of Wound/Ulcer Post Debridement is improved. Severity of Tissue Post Debridement is: Fat layer exposed. Post procedure Diagnosis Wound #11: Same as Pre-Procedure Wound #12 Pre-procedure diagnosis of Wound #12 is a Diabetic Wound/Ulcer of the Lower Extremity located on the Left,Dorsal Foot .Severity of Tissue Pre Debridement is: Fat layer exposed. There was a Selective/Open Wound Non-Viable Tissue Debridement with a total area of 1.04 sq cm  performed by [name], MD. With the following instrument(s): Curette to remove Viable and Non-Viable tissue/material. Material removed includes Callus and Slough and after achieving pain control using Lidocaine. No specimens were taken. A time out was conducted at 15:30, prior to the start of the procedure. A Minimum amount of bleeding was controlled with Pressure. The procedure was tolerated well with a pain level of 0 throughout and a pain level of 0 following the procedure. Post Debridement Measurements: 1.3cm length x 0.8cm width x 0.1cm depth; 0.082cm^3 volume. Character of Wound/Ulcer Post Debridement is improved. Severity of Tissue Post Debridement is: Fat layer exposed. Post procedure Diagnosis Wound #12: Same as Pre-Procedure Wound #8 Pre-procedure diagnosis of Wound #8 is a Diabetic Wound/Ulcer of the Lower Extremity located on the Right,Lateral Foot .Severity of Tissue Pre Debridement is: Fat layer exposed. There was a Excisional Skin/Subcutaneous Tissue Debridement with a total  area of 14.85 sq cm performed by [name], MD. With the following instrument(s): Curette to remove Viable and Non-Viable tissue/material. Material removed includes Callus, Subcutaneous Tissue, and Slough after achieving pain control using Lidocaine. No specimens were taken. A time out was conducted at 15:30, prior to the start of the procedure. A Minimum amount of bleeding was controlled with Pressure. The procedure was tolerated well with a pain level of 0 throughout and a pain level of 0 following the procedure. Post Debridement Measurements: 4.5cm length x 3.3cm width x 1.5cm depth; 17.495cm^3 volume. Character of Wound/Ulcer Post Debridement is improved. Severity of Tissue Post Debridement is: Fat layer exposed. Post procedure Diagnosis Wound #8: Same as Pre-Procedure Plan Follow-up Appointments: Return Appointment in 1 week. - Dr. [name] - Room 1 with [facility] Friday 7/14 @ 2:45 pm Bathing/ Shower/ Hygiene: May shower with protection but do not get wound dressing(s) wet. - Use Cast Protector Bag on Right and left legs Edema Control - Lymphedema / SCD / Other: Elevate legs to the level of the heart or above for 30 minutes daily and/or when sitting, a frequency of: - throughout the day Avoid standing for long periods of time. Exercise regularly Compression stocking or Garment 20-30 mm/Hg pressure to: - to both legs daily Off-Loading: Open toe surgical shoe to: - right foot Additional Orders / Instructions: Follow Nutritious Diet WOUND #11: - Foot Wound Laterality: Right, Anterior Cleanser: Soap and Water 1 x Per Day/30 Days Discharge Instructions: May shower and wash wound with dial antibacterial soap and water prior to dressing change. Cleanser: Byram Ancillary Kit - 15 Day Supply (Generic) 1 x Per Day/30 Days Discharge Instructions: Use supplies as instructed; Kit contains: (15) Saline Bullets; (15) 3x3 Gauze; 15 pr Gloves Topical: keystone antibiotic compound 1 x Per Day/30  Days Prim Dressing: KerraCel Ag Gelling Fiber Dressing, 4x5 in (silver alginate) (Dispense As Written) 1 x Per Day/30 Days ary Discharge Instructions: Apply silver alginate to wound bed as instructed Secondary Dressing: ABD Pad, 8x10 (Generic) 1 x Per Day/30 Days Discharge Instructions: Apply over primary dressing as directed. Secondary Dressing: Optifoam Non-Adhesive Dressing, 4x4 in (Generic) 1 x Per Day/30 Days Discharge Instructions: Apply over primary dressing as directed. Secondary Dressing: Zetuvit Plus 4x8 in (Dispense As Written) 1 x Per Day/30 Days Discharge Instructions: Apply over primary dressing as directed. Secondary Dressing: NonWoven Sponge, 4x4 (in/in) (Generic) 1 x Per Day/30 Days Secured With: Coban Self-Adherent Wrap 4x5 (in/yd) (Generic) 1 x Per Day/30 Days Discharge Instructions: Secure with Coban as directed. Secured With: [employer], 4.5x3.1 (in/yd) (Generic) 1 x Per Day/30  Days Discharge Instructions: Secure with Kerlix as directed. Secured With: 21M Medipore H Soft Cloth Surgical T ape, 4 x 10 (in/yd) (Generic) 1 x Per Day/30 Days Discharge Instructions: Secure with tape as directed. WOUND #12: - Foot Wound Laterality: Dorsal, Left Cleanser: Soap and Water 1 x Per Day/30 Days Discharge Instructions: May shower and wash wound with dial antibacterial soap and water prior to dressing change. Cleanser: Byram Ancillary Kit - 15 Day Supply (Generic) 1 x Per Day/30 Days Discharge Instructions: Use supplies as instructed; Kit contains: (15) Saline Bullets; (15) 3x3 Gauze; 15 pr Gloves Topical: keystone antibiotic compound 1 x Per Day/30 Days Prim Dressing: KerraCel Ag Gelling Fiber Dressing, 4x5 in (silver alginate) (Dispense As Written) 1 x Per Day/30 Days ary Discharge Instructions: Apply silver alginate to wound bed as instructed Secondary Dressing: ABD Pad, 8x10 (Generic) 1 x Per Day/30 Days Discharge Instructions: Apply over primary dressing as  directed. Secondary Dressing: NonWoven Sponge, 4x4 (in/in) (Generic) 1 x Per Day/30 Days Secured With: Coban Self-Adherent Wrap 4x5 (in/yd) (Generic) 1 x Per Day/30 Days Discharge Instructions: Secure with Coban as directed. Secured With: [employer], 4.5x3.1 (in/yd) (Generic) 1 x Per Day/30 Days Discharge Instructions: Secure with Kerlix as directed. Secured With: 21M Medipore H Soft Cloth Surgical T ape, 4 x 10 (in/yd) (Generic) 1 x Per Day/30 Days Discharge Instructions: Secure with tape as directed. WOUND #3: - Foot Wound Laterality: Plantar, Left, Lateral Cleanser: Soap and Water 1 x Per Day/30 Days Discharge Instructions: May shower and wash wound with dial antibacterial soap and water prior to dressing change. Cleanser: Byram Ancillary Kit - 15 Day Supply (Generic) 1 x Per Day/30 Days Discharge Instructions: Use supplies as instructed; Kit contains: (15) Saline Bullets; (15) 3x3 Gauze; 15 pr Gloves Topical: keystone antibiotic compound 1 x Per Day/30 Days Prim Dressing: KerraCel Ag Gelling Fiber Dressing, 4x5 in (silver alginate) (Dispense As Written) 1 x Per Day/30 Days ary Discharge Instructions: Apply silver alginate to wound bed as instructed Secondary Dressing: ABD Pad, 8x10 (Generic) 1 x Per Day/30 Days Discharge Instructions: Apply over primary dressing as directed. Secondary Dressing: Optifoam Non-Adhesive Dressing, 4x4 in (Generic) 1 x Per Day/30 Days Discharge Instructions: Apply over primary dressing as directed. Secondary Dressing: Zetuvit Plus 4x8 in (Generic) 1 x Per Day/30 Days Discharge Instructions: Apply over primary dressing as directed. Secondary Dressing: NonWoven Sponge, 4x4 (in/in) (Generic) 1 x Per Day/30 Days Secured With: Coban Self-Adherent Wrap 4x5 (in/yd) (Generic) 1 x Per Day/30 Days Discharge Instructions: Secure with Coban as directed. Secured With: [employer], 4.5x3.1 (in/yd) (Generic) 1 x Per Day/30 Days Discharge Instructions:  Secure with Kerlix as directed. Secured With: 21M Medipore H Soft Cloth Surgical T ape, 4 x 10 (in/yd) (Generic) 1 x Per Day/30 Days Discharge Instructions: Secure with tape as directed. WOUND #8: - Foot Wound Laterality: Right, Lateral Cleanser: Soap and Water 1 x Per Day/30 Days Discharge Instructions: May shower and wash wound with dial antibacterial soap and water prior to dressing change. Cleanser: Byram Ancillary Kit - 15 Day Supply (Generic) 1 x Per Day/30 Days Discharge Instructions: Use supplies as instructed; Kit contains: (15) Saline Bullets; (15) 3x3 Gauze; 15 pr Gloves Topical: keystone antibiotic compound 1 x Per Day/30 Days Prim Dressing: KerraCel Ag Gelling Fiber Dressing, 4x5 in (silver alginate) (Dispense As Written) 1 x Per Day/30 Days ary Discharge Instructions: Apply silver alginate to wound bed as instructed Secondary Dressing: ABD Pad, 8x10 (Generic) 1 x Per Day/30 Days Discharge Instructions:  Apply over primary dressing as directed. Secondary Dressing: NonWoven Sponge, 4x4 (in/in) (Generic) 1 x Per Day/30 Days Secured With: Coban Self-Adherent Wrap 4x5 (in/yd) (Generic) 1 x Per Day/30 Days Discharge Instructions: Secure with Coban as directed. Secured With: [employer], 4.5x3.1 (in/yd) (Generic) 1 x Per Day/30 Days Discharge Instructions: Secure with Kerlix as directed. Secured With: 29M Medipore H Soft Cloth Surgical T ape, 4 x 10 (in/yd) (Generic) 1 x Per Day/30 Days Discharge Instructions: Secure with tape as directed. 10/08/2021: His wounds are basically unchanged from last week. I used a curette to debride slough and subcutaneous tissue from both of the ankle wounds, slough, nonviable fat, and periwound callus from the right foot wound and periwound callus from the left foot wound. We will continue the topical gentamicin with silver alginate and Kerlix and Coban until his new [employer] arrives, after which he should stop the gentamicin and use the new [place]  product. We will work on getting proper prior authorization for his MRI; I anticipate that he may need long-term IV antibiotics again. He will complete the course of oral antibiotics that I prescribed. Follow-up in 1 week. Electronic Signature(s) Signed: 10/08/2021 3:52:48 PM By: [name] MD FACS Entered By: [name] on 10/08/2021 15:52:48 -------------------------------------------------------------------------------- HxROS Details Patient Name: Date of Service: [name], [name] 10/08/2021 2:45 PM Medical Record Number: [id number] Patient Account Number: [id number] Date of Birth/Sex: Treating RN: 02/22/1986 (36 y.o. [name] Primary Care Provider: [name] Other Clinician: Referring Provider: Treating Provider/Extender: [name] in Treatment: 16 Information Obtained From Patient Eyes Medical History: Negative for: Cataracts; Glaucoma; Optic Neuritis Ear/Nose/Mouth/Throat Medical History: Negative for: Chronic sinus problems/congestion; Middle ear problems Hematologic/Lymphatic Medical History: Negative for: Anemia; Hemophilia; Human Immunodeficiency Virus; Lymphedema; Sickle Cell Disease Respiratory Medical History: Negative for: Aspiration; Asthma; Chronic Obstructive Pulmonary Disease (COPD); Pneumothorax; Sleep Apnea; Tuberculosis Cardiovascular Medical History: Negative for: Angina; Arrhythmia; Congestive Heart Failure; Coronary Artery Disease; Deep Vein Thrombosis; Hypertension; Hypotension; Myocardial Infarction; Peripheral Arterial Disease; Peripheral Venous Disease; Phlebitis; Vasculitis Gastrointestinal Medical History: Negative for: Cirrhosis ; Colitis; Crohns; Hepatitis A; Hepatitis B; Hepatitis C Endocrine Medical History: Positive for: Type II Diabetes Negative for: Type I Diabetes Time with diabetes: 8 Treated with: Insulin Blood sugar tested every day: No Immunological Medical History: Negative  for: Lupus Erythematosus; Raynauds; Scleroderma Integumentary (Skin) Medical History: Negative for: History of Burn Musculoskeletal Medical History: Negative for: Gout; Rheumatoid Arthritis; Osteoarthritis; Osteomyelitis Neurologic Medical History: Negative for: Dementia; Neuropathy; Quadriplegia; Paraplegia; Seizure Disorder Oncologic Medical History: Negative for: Received Chemotherapy; Received Radiation Psychiatric Medical History: Negative for: Anorexia/bulimia; Confinement Anxiety Immunizations Pneumococcal Vaccine: Received Pneumococcal Vaccination: No Implantable Devices None Hospitalization / Surgery History Type of Hospitalization/Surgery 11/1-11/06/2018- sepsis foot infection 11/4-11/5 02 sats low respiratory distress Family and Social History Cancer: No; Diabetes: No; Hereditary Spherocytosis: No; Hypertension: No; Kidney Disease: No; Lung Disease: No; Seizures: No; Stroke: No; Thyroid Problems: No; Tuberculosis: No; Never smoker; Marital Status - Single; Alcohol Use: Rarely; Drug Use: No History; Caffeine Use: Never; Financial Concerns: No; Food, Clothing or Shelter Needs: No; Support System Lacking: No; Transportation Concerns: No Electronic Signature(s) Signed: 10/08/2021 4:33:43 PM By: [name] MD FACS Signed: 10/11/2021 5:47:34 PM By: Baruch Gouty RN, BSN Entered By: [name] on 10/08/2021 15:49:24 -------------------------------------------------------------------------------- [place] Details Patient Name: Date of Service: [name], [name] 10/08/2021 Medical Record Number: [id number] Patient Account Number: [id number] Date of Birth/Sex: Treating RN: 1985/09/30 (36 y.o. 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 Facility Procedures CPT4 Code: 45809983 Description: [place] - DEB SUBQ TISSUE 20 SQ CM/< ICD-10 Diagnosis Description L97.518 Non-pressure chronic ulcer of other part of right foot with other specified severit 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  L97.322 Non-pressure chronic ulcer of left ankle with fat layer exposed Modifier: y Quantity: 1 CPT4 Code: [id number] Description: 76734 - DEB SUBQ TISS EA ADDL 20CM ICD-10 Diagnosis Description L97.518 Non-pressure chronic ulcer of other part of right foot with other specified severit 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  L97.322 Non-pressure chronic ulcer of left ankle with fat layer exposed Modifier: y Quantity: 1 CPT4 Code: [id number] Description: 09735 - DEBRIDE WOUND 1ST 20 SQ CM OR < ICD-10 Diagnosis Description L97.528 Non-pressure chronic ulcer of other part of left foot with other specified severity Modifier: Quantity: 1 Physician Procedures : CPT4 Code Description Modifier [id number] - WC PHYS LEVEL 3 - EST PT 25 ICD-10 Diagnosis Description L97.528 Non-pressure chronic ulcer of other part of left foot with other specified severity L97.518 Non-pressure chronic ulcer of other part of  right foot with other specified severity 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 Quantity: 1 : [id number] 11042 - WC PHYS SUBQ TISS 20 SQ CM ICD-10 Diagnosis Description L97.518 Non-pressure chronic ulcer of other part of right foot  with other specified severity 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 L97.322  Non-pressure chronic ulcer of left ankle with fat layer exposed Quantity: 1 : [id number] 41740 - WC PHYS SUBQ TISS EA ADDL 20 CM ICD-10 Diagnosis Description L97.518 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

## 2021-10-11 NOTE — Progress Notes (Signed)
Max, Max ([id number]) Visit Report for 10/08/2021 Arrival Information Details Patient Name: Date of Service: Max Max 10/08/2021 2:45 PM Medical Record Number: [id number] Patient Account Number: [id number] Date of Birth/Sex: Treating RN: [birth year] (36 y.o. Max Max Primary Care Max Max: Max Max Other Clinician: Referring Max Max: Treating Max Max/Extender: Max Max in Treatment: 13 Visit Information History Since Last Visit Added or deleted any medications: No Patient Arrived: Ambulatory Any new allergies or adverse reactions: No Arrival Time: 15:02 Had a fall or experienced change in No Accompanied By: self activities of daily living that may affect Transfer Assistance: None risk of falls: Patient Identification Verified: Yes Signs or symptoms of abuse/neglect since last visito No Patient Requires Transmission-Based Precautions: No Hospitalized since last visit: No Patient Has Alerts: No Implantable device outside of the clinic excluding No cellular tissue based products placed in the center since last visit: Has Dressing in Place as Prescribed: Yes Pain Present Now: No Electronic Signature(s) Signed: 10/08/2021 5:55:32 PM By: Max Catholic RN Entered By: Max Max on 10/08/2021 15:03:13 -------------------------------------------------------------------------------- Encounter Discharge Information Details Patient Name: Date of Service: Max Max, Max Max 10/08/2021 2:45 PM Medical Record Number: [id number] Patient Account Number: [id number] Date of Birth/Sex: Treating RN: [birth year] (36 y.o. Max Max Primary Care Max Max: Max Max Other Clinician: Referring Max Max: Treating Max Max/Extender: Max Max in Treatment: [phone] Encounter Discharge Information Items Post Procedure Vitals Discharge Condition: Stable Temperature (F): 97.6 Ambulatory Status:  Ambulatory Pulse (bpm): 94 Discharge Destination: Home Respiratory Rate (breaths/min): 18 Transportation: Private Auto Blood Pressure (mmHg): 136/86 Accompanied By: self Schedule Follow-up Appointment: Yes Clinical Summary of Care: Patient Declined Electronic Signature(s) Signed: 10/08/2021 5:55:32 PM By: Max Catholic RN Entered By: Max Max on 10/08/2021 17:55:11 -------------------------------------------------------------------------------- Lower Extremity Assessment Details Patient Name: Date of Service: Max Max 10/08/2021 2:45 PM Medical Record Number: [id number] Patient Account Number: [id number] Date of Birth/Sex: Treating RN: 1985/10/29 (36 y.o. Max Max Primary Care Max Max: Max Max Other Clinician: Referring Jamilla Galli: Treating Max Max/Extender: Max Max in Treatment: 102 Edema Assessment Assessed: Max Max: No] Max Max: No] Edema: [Left: Yes] [Right: Yes] Calf Left: Right: Point of Measurement: 48 cm From Medial Instep 46.5 cm 50.5 cm Ankle Left: Right: Point of Measurement: 11 cm From Medial Instep 27.3 cm 33 cm Electronic Signature(s) Signed: 10/08/2021 5:55:32 PM By: Max Catholic RN Entered By: Max Max on 10/08/2021 15:10:30 -------------------------------------------------------------------------------- Multi Wound Chart Details Patient Name: Date of Service: Max Max, Max Max 10/08/2021 2:45 PM Medical Record Number: [id number] Patient Account Number: [id number] Date of Birth/Sex: Treating RN: 05-25-1985 (36 y.o. Max Max Primary Care Max Max: Max Max Other Clinician: Referring Permelia Bamba: Treating Mordche Hedglin/Extender: Max Max in Treatment: 102 Vital Signs Height(in): 77 Pulse(bpm): 94 Weight(lbs): 280 Blood Pressure(mmHg): 136/86 Body Mass Index(BMI): 33.2 Temperature(F): 97.6 Respiratory Rate(breaths/min): 18 Photos: Right,  Anterior Foot Left, Dorsal Foot Left, Lateral, Plantar Foot Wound Location: Gradually Appeared Gradually Appeared Trauma Wounding Event: Diabetic Wound/Ulcer of the Lower Diabetic Wound/Ulcer of the Lower Diabetic Wound/Ulcer of the Lower Primary Etiology: Extremity Extremity Extremity Type II Diabetes Type II Diabetes Type II Diabetes Comorbid History: 08/20/2021 09/29/2021 10/02/2019 Date Acquired: 7 1 102 Weeks of Treatment: Open Open Open Wound Status: No No No Wound Recurrence: 3x2.5x0.1 1.3x0.8x0.1 3x3.3x0.2 Measurements L x W x Max (cm) 5.89 0.817 7.775 A (cm) : rea 0.589 0.082 1.555 Volume (cm) : -18900.00% 13.30% -371.50% % Reduction in A rea: -19533.30%  12.80% -842.40% % Reduction in Volume: No No No Undermining: Grade 2 Grade 2 Grade 2 Classification: Medium Medium Medium Exudate A mount: Serosanguineous Serosanguineous Serosanguineous Exudate Type: red, brown red, brown red, brown Exudate Color: Distinct, outline attached Distinct, outline attached Well defined, not attached Wound Margin: Small (1-33%) Small (1-33%) Large (67-100%) Granulation A mount: Red, Pink Red Pink Granulation Quality: Large (67-100%) Large (67-100%) Small (1-33%) Necrotic A mount: Eschar, Adherent Slough Eschar, Adherent Slough Adherent Slough Necrotic Tissue: Fat Layer (Subcutaneous Tissue): Yes Fascia: No Fat Layer (Subcutaneous Tissue): Yes Exposed Structures: Fascia: No Fat Layer (Subcutaneous Tissue): No Fascia: No Tendon: No Tendon: No Tendon: No Muscle: No Muscle: No Muscle: No Joint: No Joint: No Joint: No Bone: No Bone: No Bone: No Small (1-33%) Small (1-33%) Small (1-33%) Epithelialization: Debridement - Excisional Debridement - Selective/Open Wound N/A Debridement: Pre-procedure Verification/Time Out 15:30 15:30 N/A Taken: Lidocaine Lidocaine N/A Pain Control: Necrotic/Eschar, Subcutaneous, Callus, Slough N/A Tissue  Debrided: Slough Skin/Subcutaneous Tissue Non-Viable Tissue N/A Level: 7.5 1.04 N/A Debridement A (sq cm): rea Curette Curette N/A Instrument: Minimum Minimum N/A Bleeding: Pressure Pressure N/A Hemostasis Achieved: 0 0 N/A Procedural Pain: 0 0 N/A Post Procedural Pain: Debridement Treatment Response: Procedure was tolerated well Procedure was tolerated well N/A Post Debridement Measurements L x 3x2.5x0.1 1.3x0.8x0.1 N/A W x Max (cm) 0.589 0.082 N/A Post Debridement Volume: (cm) Debridement Debridement N/A Procedures Performed: Wound Number: 8 N/A N/A Photos: N/A N/A Right, Lateral Foot N/A N/A Wound Location: Blister N/A N/A Wounding Event: Diabetic Wound/Ulcer of the Lower N/A N/A Primary Etiology: Extremity Type II Diabetes N/A N/A Comorbid History: 04/18/2021 N/A N/A Date Acquired: 24 N/A N/A Weeks of Treatment: Open N/A N/A Wound Status: No N/A N/A Wound Recurrence: 4.5x3.3x1.5 N/A N/A Measurements L x W x Max (cm) 11.663 N/A N/A A (cm) : rea 17.495 N/A N/A Volume (cm) : -2020.50% N/A N/A % Reduction in A rea: -15804.50% N/A N/A % Reduction in Volume: 12 Starting Position 1 (o'clock): 2 Ending Position 1 (o'clock): 1.6 Maximum Distance 1 (cm): Yes N/A N/A Undermining: Grade 2 N/A N/A Classification: Large N/A N/A Exudate A mount: Serosanguineous N/A N/A Exudate Type: red, brown N/A N/A Exudate Color: Well defined, not attached N/A N/A Wound Margin: Large (67-100%) N/A N/A Granulation A mount: Red, Pink N/A N/A Granulation Quality: Small (1-33%) N/A N/A Necrotic A mount: Adherent Slough N/A N/A Necrotic Tissue: Fascia: No N/A N/A Exposed Structures: Fat Layer (Subcutaneous Tissue): No Tendon: No Muscle: No Joint: No Bone: No Small (1-33%) N/A N/A Epithelialization: Debridement - Excisional N/A N/A Debridement: Pre-procedure Verification/Time Out 15:30 N/A N/A Taken: Lidocaine N/A N/A Pain Control: Callus, Subcutaneous,  Slough N/A N/A Tissue Debrided: Skin/Subcutaneous Tissue N/A N/A Level: 14.85 N/A N/A Debridement A (sq cm): rea Curette N/A N/A Instrument: Minimum N/A N/A Bleeding: Pressure N/A N/A Hemostasis A chieved: 0 N/A N/A Procedural Pain: 0 N/A N/A Post Procedural Pain: Procedure was tolerated well N/A N/A Debridement Treatment Response: 4.5x3.3x1.5 N/A N/A Post Debridement Measurements L x W x Max (cm) 17.495 N/A N/A Post Debridement Volume: (cm) Debridement N/A N/A Procedures Performed: Treatment Notes Electronic Signature(s) Signed: 10/08/2021 3:43:05 PM By: [name] MD FACS Signed: 10/11/2021 5:47:34 PM By: Baruch Gouty RN, BSN Entered By: [name] on 10/08/2021 15:43:05 -------------------------------------------------------------------------------- Multi-Disciplinary Care Plan Details Patient Name: Date of Service: Max Max, Max Max 10/08/2021 2:45 PM Medical Record Number: [id number] Patient Account Number: [id number] Date of Birth/Sex: Treating RN: [birth year] (37 y.o. [name] Primary Care  Rainelle Sulewski: Max Max Other Clinician: Referring Zyona Pettaway: Treating Giliana Vantil/Extender: Max Max in Treatment: [place] reviewed with physician Active Inactive Nutrition Nursing Diagnoses: Imbalanced nutrition Potential for alteratiion in Nutrition/Potential for imbalanced nutrition Goals: Patient/caregiver agrees to and verbalizes understanding of need to use nutritional supplements and/or vitamins as prescribed Date Initiated: 10/24/2019 Date Inactivated: 04/06/2020 Target Resolution Date: 04/03/2020 Goal Status: Met Patient/caregiver will maintain therapeutic glucose control Date Initiated: 10/24/2019 Target Resolution Date: 10/29/2021 Goal Status: Active Interventions: Assess HgA1c results as ordered upon admission and as needed Assess patient nutrition upon admission and as needed per  policy Provide education on elevated blood sugars and impact on wound healing Provide education on nutrition Treatment Activities: Education provided on Nutrition : 12/02/2020 Notes: 11/17/20: Glucose control ongoing issue, target date extended. 01/26/21: Glucose management continues. Wound/Skin Impairment Nursing Diagnoses: Impaired tissue integrity Knowledge deficit related to ulceration/compromised skin integrity Goals: Patient/caregiver will verbalize understanding of skin care regimen Date Initiated: 10/24/2019 Target Resolution Date: 10/29/2021 Goal Status: Active Ulcer/skin breakdown will have a volume reduction of 30% by week 4 Date Initiated: 10/24/2019 Date Inactivated: 01/16/2020 Target Resolution Date: 01/10/2020 Unmet Reason: no change in Goal Status: Unmet measurements. Interventions: Assess patient/caregiver ability to obtain necessary supplies Assess patient/caregiver ability to perform ulcer/skin care regimen upon admission and as needed Assess ulceration(s) every visit Provide education on ulcer and skin care Notes: 11/17/20: Wound care regimen continues Electronic Signature(s) Signed: 10/11/2021 4:44:20 PM By: Rhae Hammock RN Entered By: Rhae Hammock on 10/08/2021 15:23:01 -------------------------------------------------------------------------------- Pain Assessment Details Patient Name: Date of Service: Max Max, Max Max 10/08/2021 2:45 PM Medical Record Number: [id number] Patient Account Number: [id number] Date of Birth/Sex: Treating RN: 06-27-1985 (36 y.o. Max Max Primary Care Obelia Bonello: Max Max Other Clinician: Referring Dyson Sevey: Treating Trevonte Ashkar/Extender: Max Max in Treatment: 102 Active Problems Location of Pain Severity and Description of Pain Patient Has Paino No Site Locations Pain Management and Medication Current Pain Management: Electronic Signature(s) Signed: 10/08/2021 5:55:32 PM  By: Max Catholic RN Entered By: Max Max on 10/08/2021 15:09:10 -------------------------------------------------------------------------------- Patient/Caregiver Education Details Patient Name: Date of Service: Max Max 7/7/2023andnbsp2:45 PM Medical Record Number: [id number] Patient Account Number: [id number] Date of Birth/Gender: Treating RN: 07/02/1985 (36 y.o. [name] Primary Care Physician: Max Max Other Clinician: Referring Physician: Treating Physician/Extender: Max Max in Treatment: 65 Education Assessment Education Provided To: Patient Education Topics Provided Elevated Blood Sugar/ Impact on Healing: Methods: Explain/Verbal Responses: Reinforcements needed, State content correctly Electronic Signature(s) Signed: 10/11/2021 4:44:20 PM By: Rhae Hammock RN Entered By: Rhae Hammock on 10/08/2021 15:23:16 -------------------------------------------------------------------------------- Wound Assessment Details Patient Name: Date of Service: Max Max, Max Max 10/08/2021 2:45 PM Medical Record Number: [id number] Patient Account Number: [id number] Date of Birth/Sex: Treating RN: 06-30-1985 (37 y.o. Max Max Primary Care Anwen Cannedy: Max Max Other Clinician: Referring Ahja Martello: Treating Manu Rubey/Extender: Max Max in Treatment: 102 Wound Status Wound Number: 11 Primary Etiology: Diabetic Wound/Ulcer of the Lower Extremity Wound Location: Right, Anterior Foot Wound Status: Open Wounding Event: Gradually Appeared Comorbid History: Type II Diabetes Date Acquired: 08/20/2021 Weeks Of Treatment: 7 Clustered Wound: No Photos Wound Measurements Length: (cm) 3 Width: (cm) 2.5 Depth: (cm) 0.1 Area: (cm) 5.89 Volume: (cm) 0.589 % Reduction in Area: -18900% % Reduction in Volume: -19533.3% Epithelialization: Small (1-33%) Tunneling:  No Undermining: No Wound Description Classification: Grade 2 Wound Margin: Distinct, outline attached Exudate Amount: Medium Exudate Type: Serosanguineous Exudate Color:  red, brown Foul Odor After Cleansing: No Slough/Fibrino Yes Wound Bed Granulation Amount: Small (1-33%) Exposed Structure Granulation Quality: Red, Pink Fascia Exposed: No Necrotic Amount: Large (67-100%) Fat Layer (Subcutaneous Tissue) Exposed: Yes Necrotic Quality: Eschar, Adherent Slough Tendon Exposed: No Muscle Exposed: No Joint Exposed: No Bone Exposed: No Treatment Notes Wound #11 (Foot) Wound Laterality: Right, Anterior Cleanser Soap and Water Discharge Instruction: May shower and wash wound with dial antibacterial soap and water prior to dressing change. Byram Ancillary Kit - 15 Day Supply Discharge Instruction: Use supplies as instructed; Kit contains: (15) Saline Bullets; (15) 3x3 Gauze; 15 pr Gloves Peri-Wound Care Topical keystone antibiotic compound Primary Dressing KerraCel Ag Gelling Fiber Dressing, 4x5 in (silver alginate) Discharge Instruction: Apply silver alginate to wound bed as instructed Secondary Dressing ABD Pad, 8x10 Discharge Instruction: Apply over primary dressing as directed. Optifoam Non-Adhesive Dressing, 4x4 in Discharge Instruction: Apply over primary dressing as directed. Zetuvit Plus 4x8 in Discharge Instruction: Apply over primary dressing as directed. NonWoven Sponge, 4x4 (in/in) Secured With Coban Self-Adherent Wrap 4x5 (in/yd) Discharge Instruction: Secure with Coban as directed. Kerlix Roll Sterile, 4.5x3.1 (in/yd) Discharge Instruction: Secure with Kerlix as directed. 94M Medipore H Soft Cloth Surgical T ape, 4 x 10 (in/yd) Discharge Instruction: Secure with tape as directed. Compression Wrap Compression Stockings Add-Ons Electronic Signature(s) Signed: 10/08/2021 5:55:32 PM By: Max Catholic RN Signed: 10/11/2021 4:36:07 PM By: Sandre Kitty Entered  By: Sandre Kitty on 10/08/2021 15:18:45 -------------------------------------------------------------------------------- Wound Assessment Details Patient Name: Date of Service: Max Max, Max Max 10/08/2021 2:45 PM Medical Record Number: [id number] Patient Account Number: [id number] Date of Birth/Sex: Treating RN: [birth year] (36 y.o. Max Max Primary Care Denijah Karrer: Max Max Other Clinician: Referring Dallie Patton: Treating Cru Kritikos/Extender: Max Max in Treatment: 102 Wound Status Wound Number: 12 Primary Etiology: Diabetic Wound/Ulcer of the Lower Extremity Wound Location: Left, Dorsal Foot Wound Status: Open Wounding Event: Gradually Appeared Comorbid History: Type II Diabetes Date Acquired: 09/29/2021 Weeks Of Treatment: 1 Clustered Wound: No Photos Wound Measurements Length: (cm) 1.3 Width: (cm) 0.8 Depth: (cm) 0.1 Area: (cm) 0.817 Volume: (cm) 0.082 % Reduction in Area: 13.3% % Reduction in Volume: 12.8% Epithelialization: Small (1-33%) Tunneling: No Undermining: No Wound Description Classification: Grade 2 Wound Margin: Distinct, outline attached Exudate Amount: Medium Exudate Type: Serosanguineous Exudate Color: red, brown Foul Odor After Cleansing: No Slough/Fibrino Yes Wound Bed Granulation Amount: Small (1-33%) Exposed Structure Granulation Quality: Red Fascia Exposed: No Necrotic Amount: Large (67-100%) Fat Layer (Subcutaneous Tissue) Exposed: No Necrotic Quality: Eschar, Adherent Slough Tendon Exposed: No Muscle Exposed: No Joint Exposed: No Bone Exposed: No Treatment Notes Wound #12 (Foot) Wound Laterality: Dorsal, Left Cleanser Soap and Water Discharge Instruction: May shower and wash wound with dial antibacterial soap and water prior to dressing change. Byram Ancillary Kit - 15 Day Supply Discharge Instruction: Use supplies as instructed; Kit contains: (15) Saline Bullets; (15) 3x3 Gauze; 15 pr  Gloves Peri-Wound Care Topical keystone antibiotic compound Primary Dressing KerraCel Ag Gelling Fiber Dressing, 4x5 in (silver alginate) Discharge Instruction: Apply silver alginate to wound bed as instructed Secondary Dressing ABD Pad, 8x10 Discharge Instruction: Apply over primary dressing as directed. NonWoven Sponge, 4x4 (in/in) Secured With Coban Self-Adherent Wrap 4x5 (in/yd) Discharge Instruction: Secure with Coban as directed. Kerlix Roll Sterile, 4.5x3.1 (in/yd) Discharge Instruction: Secure with Kerlix as directed. 94M Medipore H Soft Cloth Surgical T ape, 4 x 10 (in/yd) Discharge Instruction: Secure with tape as directed. Compression Wrap Compression Stockings Add-Ons Electronic Signature(s) Signed: 10/08/2021  5:55:32 PM By: Max Catholic RN Signed: 10/11/2021 4:36:07 PM By: Sandre Kitty Entered By: Sandre Kitty on 10/08/2021 15:18:10 -------------------------------------------------------------------------------- Wound Assessment Details Patient Name: Date of Service: Max Max, Max Max 10/08/2021 2:45 PM Medical Record Number: [id number] Patient Account Number: [id number] Date of Birth/Sex: Treating RN: [birth year] (36 y.o. Max Max Primary Care Kenan Moodie: Max Max Other Clinician: Referring Xyla Leisner: Treating Zahava Quant/Extender: Max Max in Treatment: 102 Wound Status Wound Number: 3 Primary Etiology: Diabetic Wound/Ulcer of the Lower Extremity Wound Location: Left, Lateral, Plantar Foot Wound Status: Open Wounding Event: Trauma Comorbid History: Type II Diabetes Date Acquired: 10/02/2019 Weeks Of Treatment: 102 Clustered Wound: No Photos Wound Measurements Length: (cm) 3 Width: (cm) 3.3 Depth: (cm) 0.2 Area: (cm) 7.775 Volume: (cm) 1.555 Wound Description Classification: Grade 2 Wound Margin: Well defined, not attached Exudate Amount: Medium Exudate Type: Serosanguineous Exudate Color: red,  brown Foul Odor After Cleansing: No Slough/Fibrino Yes % Reduction in Area: -371.5% % Reduction in Volume: -842.4% Epithelialization: Small (1-33%) Tunneling: No Undermining: No Wound Bed Granulation Amount: Large (67-100%) Exposed Structure Granulation Quality: Pink Fascia Exposed: No Necrotic Amount: Small (1-33%) Fat Layer (Subcutaneous Tissue) Exposed: Yes Necrotic Quality: Adherent Slough Tendon Exposed: No Muscle Exposed: No Joint Exposed: No Bone Exposed: No Treatment Notes Wound #3 (Foot) Wound Laterality: Plantar, Left, Lateral Cleanser Soap and Water Discharge Instruction: May shower and wash wound with dial antibacterial soap and water prior to dressing change. Byram Ancillary Kit - 15 Day Supply Discharge Instruction: Use supplies as instructed; Kit contains: (15) Saline Bullets; (15) 3x3 Gauze; 15 pr Gloves Peri-Wound Care Topical keystone antibiotic compound Primary Dressing KerraCel Ag Gelling Fiber Dressing, 4x5 in (silver alginate) Discharge Instruction: Apply silver alginate to wound bed as instructed Secondary Dressing ABD Pad, 8x10 Discharge Instruction: Apply over primary dressing as directed. Optifoam Non-Adhesive Dressing, 4x4 in Discharge Instruction: Apply over primary dressing as directed. Zetuvit Plus 4x8 in Discharge Instruction: Apply over primary dressing as directed. NonWoven Sponge, 4x4 (in/in) Secured With Coban Self-Adherent Wrap 4x5 (in/yd) Discharge Instruction: Secure with Coban as directed. Kerlix Roll Sterile, 4.5x3.1 (in/yd) Discharge Instruction: Secure with Kerlix as directed. 61M Medipore H Soft Cloth Surgical T ape, 4 x 10 (in/yd) Discharge Instruction: Secure with tape as directed. Compression Wrap Compression Stockings Add-Ons Electronic Signature(s) Signed: 10/08/2021 5:55:32 PM By: Max Catholic RN Signed: 10/11/2021 4:36:07 PM By: Sandre Kitty Entered By: Sandre Kitty on 10/08/2021  15:17:31 -------------------------------------------------------------------------------- Wound Assessment Details Patient Name: Date of Service: Max Max, Max Max 10/08/2021 2:45 PM Medical Record Number: [id number] Patient Account Number: [id number] Date of Birth/Sex: Treating RN: 1986/03/25 (36 y.o. Max Max Primary Care Shenay Torti: Max Max Other Clinician: Referring Shalimar Mcclain: Treating Esabella Stockinger/Extender: Max Max in Treatment: 102 Wound Status Wound Number: 8 Primary Etiology: Diabetic Wound/Ulcer of the Lower Extremity Wound Location: Right, Lateral Foot Wound Status: Open Wounding Event: Blister Comorbid History: Type II Diabetes Date Acquired: 04/18/2021 Weeks Of Treatment: 24 Clustered Wound: No Photos Wound Measurements Length: (cm) 4.5 Width: (cm) 3.3 Depth: (cm) 1.5 Area: (cm) 11.663 Volume: (cm) 17.495 % Reduction in Area: -2020.5% % Reduction in Volume: -15804.5% Epithelialization: Small (1-33%) Undermining: Yes Starting Position (o'clock): 12 Ending Position (o'clock): 2 Maximum Distance: (cm) 1.6 Wound Description Classification: Grade 2 Wound Margin: Well defined, not attached Exudate Amount: Large Exudate Type: Serosanguineous Exudate Color: red, brown Foul Odor After Cleansing: No Slough/Fibrino Yes Wound Bed Granulation Amount: Large (67-100%) Exposed Structure Granulation Quality: Red, Pink Fascia Exposed: No  Necrotic Amount: Small (1-33%) Fat Layer (Subcutaneous Tissue) Exposed: No Necrotic Quality: Adherent Slough Tendon Exposed: No Muscle Exposed: No Joint Exposed: No Bone Exposed: No Treatment Notes Wound #8 (Foot) Wound Laterality: Right, Lateral Cleanser Soap and Water Discharge Instruction: May shower and wash wound with dial antibacterial soap and water prior to dressing change. Byram Ancillary Kit - 15 Day Supply Discharge Instruction: Use supplies as instructed; Kit contains: (15)  Saline Bullets; (15) 3x3 Gauze; 15 pr Gloves Peri-Wound Care Topical keystone antibiotic compound Primary Dressing KerraCel Ag Gelling Fiber Dressing, 4x5 in (silver alginate) Discharge Instruction: Apply silver alginate to wound bed as instructed Secondary Dressing ABD Pad, 8x10 Discharge Instruction: Apply over primary dressing as directed. NonWoven Sponge, 4x4 (in/in) Secured With Coban Self-Adherent Wrap 4x5 (in/yd) Discharge Instruction: Secure with Coban as directed. Kerlix Roll Sterile, 4.5x3.1 (in/yd) Discharge Instruction: Secure with Kerlix as directed. 64M Medipore H Soft Cloth Surgical T ape, 4 x 10 (in/yd) Discharge Instruction: Secure with tape as directed. Compression Wrap Compression Stockings Add-Ons Electronic Signature(s) Signed: 10/08/2021 5:55:32 PM By: Max Catholic RN Signed: 10/11/2021 4:36:07 PM By: Sandre Kitty Entered By: Sandre Kitty on 10/08/2021 15:19:49 -------------------------------------------------------------------------------- Vitals Details Patient Name: Date of Service: Max Max, Max Max 10/08/2021 2:45 PM Medical Record Number: [id number] Patient Account Number: [id number] Date of Birth/Sex: Treating RN: 03-19-1986 (36 y.o. Max Max Primary Care Tashanda Fuhrer: Max Max Other Clinician: Referring Angelus Hoopes: Treating Ahmani Prehn/Extender: Max Max in Treatment: 102 Vital Signs Time Taken: 15:04 Temperature (F): 97.6 Height (in): 77 Pulse (bpm): 94 Weight (lbs): 280 Respiratory Rate (breaths/min): 18 Body Mass Index (BMI): 33.2 Blood Pressure (mmHg): 136/86 Reference Range: 80 - 120 mg / dl Electronic Signature(s) Signed: 10/08/2021 5:55:32 PM By: Max Catholic RN Entered By: Max Max on 10/08/2021 15:07:53

## 2021-10-14 ENCOUNTER — Encounter (HOSPITAL_BASED_OUTPATIENT_CLINIC_OR_DEPARTMENT_OTHER): Payer: BC Managed Care – PPO | Admitting: General Surgery

## 2021-10-18 ENCOUNTER — Encounter (HOSPITAL_BASED_OUTPATIENT_CLINIC_OR_DEPARTMENT_OTHER): Payer: BC Managed Care – PPO | Admitting: General Surgery

## 2021-10-18 DIAGNOSIS — E11621 Type 2 diabetes mellitus with foot ulcer: Secondary | ICD-10-CM | POA: Diagnosis not present

## 2021-10-19 NOTE — Progress Notes (Signed)
[place], [name] ([id number]) Visit Report for 10/18/2021 Chief Complaint Document Details Patient Name: Date of Service: [name] 10/18/2021 12:45 PM Medical Record Number: [id number] Patient Account Number: [id number] Date of Birth/Sex: Treating RN: 12-10-1985 (36 y.o. [name] Primary Care Provider: [name] Other Clinician: Referring Provider: Treating Provider/Extender: [name] in Treatment: [phone] Information Obtained from: Patient Chief Complaint 01/11/2019; patient is here for review of a rather substantial wound over the left fifth plantar metatarsal head extending into the lateral part of his foot 10/24/2019; patient returns to clinic with wounds on his bilateral feet with underlying osteomyelitis biopsy-proven Electronic Signature(s) Signed: 10/18/2021 1:27:57 PM By: [name] MD FACS Entered By: [name] on 10/18/2021 13:27:57 -------------------------------------------------------------------------------- Debridement Details Patient Name: Date of Service: [name], [name] 10/18/2021 12:45 PM Medical Record Number: [id number] Patient Account Number: [id number] Date of Birth/Sex: Treating RN: October 17, 1985 (36 y.o. [name] Primary Care Provider: [name] Other Clinician: Referring Provider: Treating Provider/Extender: [name] in Treatment: 103 Debridement Performed for Assessment: Wound #11 Ragan Performed By: Physician [name], MD Debridement Type: Debridement Severity of Tissue Pre Debridement: Fat layer exposed Level of Consciousness (Pre-procedure): Awake and Alert Pre-procedure Verification/Time Out Yes - 13:14 Taken: Start Time: 13:17 T Area Debrided (L x W): otal 1.2 (cm) x 1.9 (cm) = 2.28 (cm) Tissue and other material debrided: Non-Viable, Eschar, Slough, Subcutaneous, Slough Level: Skin/Subcutaneous Tissue Debridement  Description: Excisional Instrument: Curette Bleeding: Minimum Hemostasis Achieved: Pressure Procedural Pain: 0 Post Procedural Pain: 0 Response to Treatment: Procedure was tolerated well Level of Consciousness (Post- Awake and Alert procedure): Post Debridement Measurements of Total Wound Length: (cm) 1.2 Width: (cm) 1.9 Depth: (cm) 0.1 Volume: (cm) 0.179 Character of Wound/Ulcer Post Debridement: Improved Severity of Tissue Post Debridement: Fat layer exposed Post Procedure Diagnosis Same as Pre-procedure Electronic Signature(s) Signed: 10/18/2021 3:35:15 PM By: [name] MD FACS Signed: 10/18/2021 5:26:27 PM By: [name] RN, BSN Entered By: [name] on 10/18/2021 13:19:33 -------------------------------------------------------------------------------- Debridement Details Patient Name: Date of Service: [name], [name] 10/18/2021 12:45 PM Medical Record Number: [id number] Patient Account Number: [id number] Date of Birth/Sex: Treating RN: [birth year] (36 y.o. [name] Primary Care Provider: [name] Other Clinician: Referring Provider: Treating Provider/Extender: [name] in Treatment: 103 Debridement Performed for Assessment: Wound #12 Left,Dorsal Foot Performed By: Physician [name], MD Debridement Type: Debridement Severity of Tissue Pre Debridement: Fat layer exposed Level of Consciousness (Pre-procedure): Awake and Alert Pre-procedure Verification/Time Out Yes - 13:14 Taken: Start Time: 13:17 T Area Debrided (L x W): otal 0.5 (cm) x 0.7 (cm) = 0.35 (cm) Tissue and other material debrided: Non-Viable, Eschar, Slough, Subcutaneous, Slough Level: Skin/Subcutaneous Tissue Debridement Description: Excisional Instrument: Curette Bleeding: Minimum Hemostasis Achieved: Pressure Procedural Pain: 0 Post Procedural Pain: 0 Response to Treatment: Procedure was tolerated well Level of Consciousness  (Post- Awake and Alert procedure): Post Debridement Measurements of Total Wound Length: (cm) 0.5 Width: (cm) 0.7 Depth: (cm) 0.1 Volume: (cm) 0.027 Character of Wound/Ulcer Post Debridement: Improved Severity of Tissue Post Debridement: Fat layer exposed Post Procedure Diagnosis Same as Pre-procedure Electronic Signature(s) Signed: 10/18/2021 3:35:15 PM By: [name] MD FACS Signed: 10/18/2021 5:26:27 PM By: [name] RN, BSN Entered By: [name] on 10/18/2021 13:21:49 -------------------------------------------------------------------------------- Debridement Details Patient Name: Date of Service: [name], [name] 10/18/2021 12:45 PM Medical Record Number: [id number] Patient Account Number: [id number] Date of Birth/Sex: Treating RN:  1986/01/22 (36 y.o. [name] Primary Care Provider: Other Clinician: [name] Referring Provider: Treating Provider/Extender: [name] in Treatment: 103 Debridement Performed for Assessment: Wound #3 Left,Lateral,Plantar Foot Performed By: Physician [name], MD Debridement Type: Debridement Severity of Tissue Pre Debridement: Fat layer exposed Level of Consciousness (Pre-procedure): Awake and Alert Pre-procedure Verification/Time Out Yes - 13:14 Taken: Start Time: 13:17 T Area Debrided (L x W): otal 3.2 (cm) x 3.8 (cm) = 12.16 (cm) Tissue and other material debrided: Non-Viable, Callus, Eschar, Slough, Subcutaneous, Skin: Dermis , Skin: Epidermis, Slough Level: Skin/Subcutaneous Tissue Debridement Description: Excisional Instrument: Curette Bleeding: Minimum Hemostasis Achieved: Pressure Procedural Pain: 0 Post Procedural Pain: 0 Response to Treatment: Procedure was tolerated well Level of Consciousness (Post- Awake and Alert procedure): Post Debridement Measurements of Total Wound Length: (cm) 3.2 Width: (cm) 3.8 Depth: (cm) 0.2 Volume: (cm) 1.91 Character of  Wound/Ulcer Post Debridement: Improved Severity of Tissue Post Debridement: Fat layer exposed Post Procedure Diagnosis Same as Pre-procedure Electronic Signature(s) Signed: 10/18/2021 3:35:15 PM By: [name] MD FACS Signed: 10/18/2021 5:26:27 PM By: [name] RN, BSN Entered By: [name] on 10/18/2021 13:22:13 -------------------------------------------------------------------------------- Debridement Details Patient Name: Date of Service: [name], [name] 10/18/2021 12:45 PM Medical Record Number: [id number] Patient Account Number: [id number] Date of Birth/Sex: Treating RN: 04/15/1985 (36 y.o. [name] Primary Care Provider: [name] Other Clinician: Referring Provider: Treating Provider/Extender: [name] in Treatment: 103 Debridement Performed for Assessment: Wound #8 Right,Lateral Foot Performed By: Physician [name], MD Debridement Type: Debridement Severity of Tissue Pre Debridement: Fat layer exposed Level of Consciousness (Pre-procedure): Awake and Alert Pre-procedure Verification/Time Out Yes - 13:14 Taken: Start Time: 13:17 T Area Debrided (L x W): otal 4.2 (cm) x 2.7 (cm) = 11.34 (cm) Tissue and other material debrided: Non-Viable, Callus, Eschar, Fat, Slough, Subcutaneous, Slough Level: Skin/Subcutaneous Tissue Debridement Description: Excisional Instrument: Curette Bleeding: Minimum Hemostasis Achieved: Pressure Procedural Pain: 0 Post Procedural Pain: 0 Response to Treatment: Procedure was tolerated well Level of Consciousness (Post- Level of Consciousness (Post- Awake and Alert procedure): Post Debridement Measurements of Total Wound Length: (cm) 4.2 Width: (cm) 2.7 Depth: (cm) 0.2 Volume: (cm) 1.781 Character of Wound/Ulcer Post Debridement: Improved Severity of Tissue Post Debridement: Fat layer exposed Post Procedure Diagnosis Same as Pre-procedure Electronic  Signature(s) Signed: 10/18/2021 3:35:15 PM By: [name] MD FACS Signed: 10/18/2021 5:26:27 PM By: [name] RN, BSN Entered By: [name] on 10/18/2021 13:23:50 -------------------------------------------------------------------------------- HPI Details Patient Name: Date of Service: [name], [name] 10/18/2021 12:45 PM Medical Record Number: [id number] Patient Account Number: [id number] Date of Birth/Sex: Treating RN: [birth year] (36 y.o. 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 He comes in for review of a wound over the plantar fifth metatarsal head extending into the lateral part of the foot. He was followed for this previously by his podiatrist Dr. Cornelius Moras. As the patient tells his story he went to see podiatry first for a swelling he developed on the lateral part of his fifth metatarsal head in May. He states this was "open" by podiatry and the area closed. He was followed up in June and it was again opened callus removed and it closed promptly. There were plans being made for surgery  on the fifth metatarsal head in June however his blood sugar was apparently too high for anesthesia. Apparently the area was debrided and opened again in June and it is never closed since. Looking over the records from podiatry I am really not able to follow this. It was clear when he was first seen it was before 5/14 at that point he already had a wound. By 5/17 the ulcer was resolved. I do not see anything about a procedure. On 5/28 noted to have pre-ulcerative moderate keratosis. X-ray noted 1/5 contracted toe and tailor's bunion and metatarsal deformity. On a visit date on 09/28/2018 the dorsal part of the  left foot it healed and resolved. There was concern about swelling in his lower extremity he was sent to the ER.. As far as I can tell he was seen in the ER on 7/12 with an ulcer on his left foot. A DVT rule out of the left leg was negative. I do not think I have complete records from podiatry but I am not able to verify the procedures this patient states he had. He states after the last procedure the wound has never closed although I am not able to follow this in the records I have from podiatry. He has not had a recent x-ray The patient has been using Neosporin on the wound. He is wearing a Darco shoe. He is still very active up on his foot working and exercising. Past medical history; type 2 diabetes ketosis-prone, leg swelling with a negative DVT study in July. Non-smoker ABI in our clinic was 0.85 on the left 10/16; substantial wound on the plantar left fifth met head extending laterally almost to the dorsal fifth MTP. We have been using silver alginate we gave him a Darco forefoot off loader. An x-ray did not show evidence of osteomyelitis did note soft tissue emphysema which I think was due to gas tracking through an open wound. There is no doubt in my mind he requires an MRI 10/23; MRI not booked until 3 November at the earliest this is largely due to his glucose sensor in the right arm. We have been using silver alginate. There has been an improvement 10/29; I am still not exactly sure when his MRI is booked for. He says it is the third but it is the 10th in epic. This definitely needs to be done. He is running a low-grade fever today but no other symptoms. No real improvement in the 1 02/26/2019 patient presents today for a follow-up visit here in our clinic he is last been seen in the clinic on October 29. Subsequently we were working on getting MRI to evaluate and see what exactly was going on and where we would need to go from the standpoint of whether or not he had osteomyelitis  and again what treatments were going be required. Subsequently the patient ended up being admitted to the hospital on 02/07/2019 and was discharged on 02/14/2019. This is a somewhat interesting admission with a discharge diagnosis of pneumonia due to COVID-19 although he was positive for COVID-19 when tested at the urgent care but negative x2 when he was actually in the hospital. With that being said he did have acute respiratory failure with hypoxia and it was noted he also have a left foot ulceration with osteomyelitis. With that being said he did require oxygen for his pneumonia and I level 4 L. He was placed on antivirals and steroids for the COVID-19. He was also transferred to the  [facility] at one point. Nonetheless he did subsequently discharged home and since being home has done much better in that regard. The CT angiogram did not show any pulmonary embolism. With regard to the osteomyelitis the patient was placed on vancomycin and Zosyn while in the hospital but has been changed to Augmentin at discharge. It was also recommended that he follow- up with wound care and podiatry. Podiatry however wanted him to see [name] according to the patient prior to them doing anything further. His hemoglobin A1c was 9.9 as noted in the hospital. Have an MRI of the left foot performed while in the hospital on 02/04/2019. 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 There is an overlying plantar open wound noted an abscess tracking back along the lateral aspect of the fifth metatarsal shaft. There is otherwise diffuse cellulitis and mild fasciitis without findings of polymyositis. The patient did have recently pneumonia secondary to COVID-19 I looked in the chart through epic and it does appear that the patient may need to have an additional x-ray just to ensure everything is cleared and that he has no airspace  disease prior to putting him into the [name]. 03/05/2019; patient was readmitted to the clinic last week. He was hospitalized twice for a viral upper respiratory tract infection from 11/1 through 11/4 and then 11/5 through 11/12 ultimately this turned out to be Covid pneumonitis. Although he was discharged on oxygen he is not using it. He says he feels fine. He has no exercise limitation no cough no sputum. His O2 sat in our clinic today was 100% on room air. 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 He received Vanco and Zosyn in the hospital and then was discharged on 2 weeks of Augmentin. I do not see any relevant cultures. He was supposed to follow-up with infectious disease but I do not see that he has an appointment. 12/8; patient saw Dr. Novella Olive of infectious disease last week. He felt that he had had adequate antibiotic therapy. He did not go to follow-up with Dr. Amalia Hailey of podiatry and I have again talked to him about the pros and cons of this. He does not want to consider a ray amputation of this time. He is aware of the risks of recurrence, migration etc. He started HBO today and tolerated this well. He can complete the Augmentin that I gave him last week. I have looked over the lab work that Dr. Chana Bode ordered his C-reactive protein was 3.3 and his sedimentation rate was 17. The C-reactive protein is never really been measurably that high in this patient 12/15; not much change in the wound today however he has undermining along the lateral part of the foot again more extensively than last week. He has some rims of epithelialization. We have been using silver alginate. He is undergoing hyperbarics but did not dive today 12/18; in for his obligatory first total contact cast change. Unfortunately there was pus coming from the undermining area around his fifth metatarsal  head. This was cultured but will preclude reapplication of a cast. He is seen in conjunction with HBO 12/24; patient had staph lugdunensis in the wound in the undermining area laterally last time. We put him on doxycycline which should have covered this. The wound looks better today. I am going to  give him another week of doxycycline before reattempting the total contact cast 12/31; the patient is completing antibiotics. Hemorrhagic debris in the distal part of the wound with some undermining distally. He also had hyper granulation. Extensive debridement with a #5 curette. The infected area that was on the lateral part of the fifth met head is closed over. I do not think he needs any more antibiotics. Patient was seen prior to HBO. Preparations for a total contact cast were made in the cast will be placed post hyperbarics 04/11/19; once again the patient arrives today without complaint. He had been in a cast all week noted that he had heavy drainage this week. This resulted in large raised areas of macerated tissue around the wound 1/14; wound bed looks better slightly smaller. Hydrofera Blue has been changing himself. He had a heavy drainage last week which caused a lot of maceration around the wound so I took him out of a total contact cast he says the drainage is actually better this week He is seen today in conjunction with HBO 1/21; returns to clinic. He was up in [place] for a day or 2 attending a funeral. He comes back in with the wound larger and with a large area of exposed bone. He had osteomyelitis and septic arthritis of the fifth left metatarsal head while he was in hospital. He received IV antibiotics in the hospital for a prolonged period of time then 3 weeks of Augmentin. Subsequently I gave him 2 weeks of doxycycline for more superficial wound infection. When I saw this last week the wound was smaller the surface of the wound looks satisfactory. 1/28; patient missed hyperbarics today.  Bone biopsy I did last time showed Enterococcus faecalis and Staphylococcus lugdunensis . He has a wide area of exposed bone. We are going to use silver alginate as of today. I had another ethical discussion with the patient. 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 In this situation I think the likelihood of healing this is low. Therefore I have recommended a ray amputation and with the patient's agreement I have referred him to Dr. Doran Durand. The other issue is that his compliance with hyperbarics has been minimal because of his work schedule and given his underlying decision I am going to stop this today READMISSION 10/24/2019 MRI 09/29/2019 left foot IMPRESSION: 1. Apparent skin ulceration inferior and lateral to the 5th metatarsal base with underlying heterogeneous T2 signal and enhancement in the subcutaneous fat. Small peripherally enhancing fluid collections along the plantar and lateral aspects of the 5th metatarsal base suspicious for abscesses. 2. Interval amputation through the mid 5th metatarsal with nonspecific low-level marrow edema and enhancement. Given the proximity to the adjacent soft tissue inflammatory changes, osteomyelitis cannot be excluded. 3. The additional bones appear unremarkable. MRI 09/29/2019 right foot IMPRESSION: 1. Soft tissue ulceration lateral to the 5th MTP joint. There is low-level T2 hyperintensity within the 4th and 5th metatarsal heads and adjacent proximal phalanges without abnormal T1 signal or cortical destruction. These findings are nonspecific and could be seen with early marrow edema, hyperemia or early osteomyelitis. No evidence of septic joint. 2. Mild tenosynovitis and synovial enhancement associated with the extensor digitorum tendons at the level of the midfoot. 3. Diffuse low-level muscular T2 hyperintensity and enhancement, most consistent with diabetic myopathy. LEFT FOOT BONE Methicillin resistant  staphylococcus aureus Staphylococcus lugdunensis MIC MIC CIPROFLOXACIN >=8 RESISTANT Resistant <=0.5 SENSI... Sensitive CLINDAMYCIN <=0.25 SENS... Sensitive >=8 RESISTANT Resistant ERYTHROMYCIN >=8 RESISTANT  Resistant >=8 RESISTANT Resistant GENTAMICIN <=0.5 SENSI... Sensitive <=0.5 SENSI... Sensitive Inducible Clindamycin NEGATIVE Sensitive NEGATIVE Sensitive OXACILLIN >=4 RESISTANT Resistant 2 SENSITIVE Sensitive RIFAMPIN <=0.5 SENSI... Sensitive <=0.5 SENSI... Sensitive TETRACYCLINE <=1 SENSITIVE Sensitive <=1 SENSITIVE Sensitive TRIMETH/SULFA <=10 SENSIT Sensitive <=10 SENSIT Sensitive ... Marland Kitchen.. VANCOMYCIN 1 SENSITIVE Sensitive <=0.5 SENSI... Sensitive Right foot bone . Component 3 wk ago Specimen Description BONE Special Requests RIGHT 4 METATARSAL SAMPLE B Gram Stain NO WBC SEEN NO ORGANISMS SEEN Culture RARE METHICILLIN RESISTANT STAPHYLOCOCCUS AUREUS NO ANAEROBES ISOLATED Performed at [facility], [place] [address] [place], [place] [address] Report Status 10/08/2019 FINAL Organism ID, Bacteria METHICILLIN RESISTANT STAPHYLOCOCCUS AUREUS Resulting Agency [facility] Susceptibility Methicillin resistant staphylococcus aureus MIC CIPROFLOXACIN >=8 RESISTANT Resistant CLINDAMYCIN <=0.25 SENS... Sensitive ERYTHROMYCIN >=8 RESISTANT Resistant GENTAMICIN <=0.5 SENSI... Sensitive Inducible Clindamycin NEGATIVE Sensitive OXACILLIN >=4 RESISTANT Resistant RIFAMPIN <=0.5 SENSI... Sensitive TETRACYCLINE <=1 SENSITIVE Sensitive TRIMETH/SULFA <=10 SENSIT Sensitive ... VANCOMYCIN 1 SENSITIVE Sensitive This is a patient we had in clinic earlier this year with a wound over his left fifth metatarsal head. 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 Doran Durand on 05/16/2019. He seems to have developed problems with wounds on his bilateral feet in June 2021 although he may have had problems earlier than this. He was in an urgent care with a right foot ulcer on 09/26/2019 and given a course of doxycycline. This was apparently after having trouble getting into see orthopedics. He was seen by podiatry on 09/28/2019 noted to have bilateral lower extremity ulcers including the left lateral fifth metatarsal base and the right subfifth met head. It was noted that had purulent drainage at that time. He required hospitalization from 6/20 through 7/2. This was because of worsening right foot wounds. He underwent bilateral operative incision and drainage and bone biopsies bilaterally. Culture results are listed above. He has been referred back to clinic by Dr. Jacqualyn Posey of podiatry. He is also followed by Dr. Megan Salon who saw him yesterday. He was discharged from hospital on Zyvox Flagyl and Levaquin and yesterday changed to doxycycline Flagyl and Levaquin. His inflammatory markers on 6/26 showed a sedimentation rate of 129 and a C-reactive protein of 5. This is improved to 14 and 1.3 respectively. This would indicate improvement. ABIs in our clinic today were 1.23 on the right and 1.20 on the left 11/01/2019 on evaluation today patient appears to be doing fairly well in regard to the wounds on his feet at this point. Fortunately there is no signs of active infection at this time. No fevers, chills, nausea, vomiting, or diarrhea. He currently is seeing infectious disease and still under their care at this point. Subsequently he also has both wounds which she has not been using collagen on as he did not receive that in his packaging he did not call us and let us know that. Apparently that just was missed on the order. Nonetheless we will get that straightened out today. 8/9-Patient returns for bilateral foot wounds, using  Prisma with hydrogel moistened dressings, and the wounds appear stable. Patient using surgical shoes, avoiding much pressure or weightbearing as much as possible 8/16; patient has bilateral foot wounds. 1 on the right lateral foot proximally the other is on the left mid  lateral foot. Both required debridement of callus and thick skin around the wounds. We have been using silver collagen 8/27; patient has bilateral lateral foot wounds. The area on the left substantially surrounded by callus and dry skin. This was removed from the wound edge. The underlying wound is small. The area on the right measured somewhat smaller today. 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 Unfortunately I did not update his antibiotics during today's visit. 9/10 I reviewed Dr. Hale Bogus last notes he felt he had completed antibiotics his inflammatory markers were reasonably well controlled. He has a small wound on the lateral left foot and a tiny area on the right which is just above closed. He is using Hydrofera Blue with border foam he has bilateral surgical shoes 9/24; 2 week f/u. doing well. right foot is closed. left foot still undermined. 10/14; right foot remains closed at the fifth met head. The area over the base of the left fifth metatarsal has a small open area but considerable undermining towards the plantar foot. Thick callus skin around this suggests an adequate pressure relief. We have talked about this. He says he is going to go back into his cam boot. I suggested a total contact cast he did not seem enamored with this suggestion 10/26; left foot base of the fifth metatarsal. Same condition as last time. He has skin over the area with an open wound however the skin is not adherent. He went to see Dr. Earleen Newport who did an x-ray and culture of his foot I have not reviewed the x-ray but the patient was not told anything. He is on  doxycycline 11/11; since the patient was last here he was in the emergency room on 10/30 he was concerned about swelling in the left foot. They did not do any cultures or x-rays. They changed his antibiotics to cephalexin. Previous culture showed group B strep. The cephalexin is appropriate as doxycycline has less than predictable coverage. Arrives in clinic today with swelling over this area under the wound. He also has a new wound on the right fifth metatarsal head 11/18; the patient has a difficult wound on the lateral aspect of the left fifth metatarsal head. The wound was almost ballotable last week I opened it slightly expecting to see purulence however there was just bleeding. I cultured this this was negative. X-ray unchanged. We are trying to get an MRI but I am not sure were going to be able to get this through his insurance. He also has an area on the right lateral fifth metatarsal head this looks healthier 12/3; the patient finally got our MRI. Surprisingly this did not show osteomyelitis. I did show the soft tissue ulceration at the lateral plantar aspect of the fifth metatarsal base with a tiny residual 6 mm abscess overlying the superficial fascia I have tried to culture this area I have not been able to get this to grow anything. Nevertheless the protruding tissue looks aggravated. I suspect we should try to treat the underlying "abscess with broad-spectrum antibiotics. I am going to start him on Levaquin and Flagyl. He has much less edema in his legs and I am going to continue to wrap his legs and see him weekly 12/10. I started Levaquin and Flagyl on him last week. He just picked up the Flagyl apparently there was some delay. The worry is the wound on the left fifth metatarsal base which is substantial and worsening. His  foot looks like he inverts at the ankle making this a weightbearing surface. Certainly no improvement in fact I think the measurements of this are somewhat worse. We  have been using 12/17; he apparently just got the Levaquin yesterday this is 2 weeks after the fact. He has completed the Flagyl. The area over the left fifth metatarsal base still has protruding granulation tissue although it does not look quite as bad as it did some weeks ago. He has severe bilateral lymphedema although we have not been treating him for wounds on his legs this is definitely going to require compression. There was so much edema in the left I did not wish to put him in a total contact cast today. I am going to increase his compression from 3-4 layer. The area on the right lateral fifth met head actually look quite good and superficial. 12/23; patient arrived with callus on the right fifth met head and the substantial hyper granulated callused wound on the base of his fifth metatarsal. He says he is completing his Levaquin in 2 days but I do not think that adds up with what I gave him but I will have to double check this. We are using Hydrofera Blue on both areas. My plan is to put the left leg in a cast the week after New Year's 04/06/2020; patient's wounds about the same. Right lateral fifth metatarsal head and left lateral foot over the base of the fifth metatarsal. There is undermining on the left lateral foot which I removed before application of total contact cast continuing with Hydrofera Blue new. Patient tells me he was seen by endocrinology today lab work was done [Dr. Kerr]. Also wondering whether he was referred to cardiology. I went over some lab work from previously does not have chronic renal failure certainly not nephrotic range proteinuria he does have very poorly controlled diabetes but this is not his most updated lab work. Hemoglobin A1c has been over 11 1/10; the patient had a considerable amount of leakage towards mid part of his left foot with macerated skin however the wound surface looks better the area on the right lateral fifth met head is better as well. I am  going to change the dressing on the left foot under the total contact cast to silver alginate, continue with Hydrofera Blue on the right. 1/20; patient was in the total contact cast for 10 days. Considerable amount of drainage although the skin around the wound does not look too bad on the left foot. The area on the right fifth metatarsal head is closed. Our nursing staff reports large amount of drainage out of the left lateral foot wound 1/25; continues with copious amounts of drainage described by our intake staff. PCR culture I did last week showed E. coli and Enterococcus faecalis and low quantities. Multiple resistance genes documented including extended spectrum beta lactamase, MRSA, MRSE, quinolone, tetracycline. The wound is not quite as good this week as it was 5 days ago but about the same size 2/3; continues with copious amounts of malodorous drainage per our intake nurse. The PCR culture I did 2 weeks ago showed E. coli and low quantities of Enterococcus. There were multiple resistance genes detected. I put Neosporin on him last week although this does not seem to have helped. The wound is slightly deeper today. Offloading continues to be an issue here although with the amount of drainage she has a total contact cast is just not going to work 2/10; moderate amount  of drainage. Patient reports he cannot get his stocking on over the dressing. I told him we have to do that the nurse gave him suggestions on how to make this work. The wound is on the bottom and lateral part of his left foot. Is cultured predominantly grew low amounts of Enterococcus, E. coli and anaerobes. There were multiple resistance genes detected including extended spectrum beta lactamase, quinolone, tetracycline. I could not think of an easy oral combination to address this so for now I am going to do topical antibiotics provided by [facility] I think the main agents here are vancomycin and an aminoglycoside. We have to be  able to give him access to the wounds to get the topical antibiotic on 2/17; moderate amount of drainage this is unchanged. He has his Keystone topical antibiotic against the deep tissue culture organisms. He has been using this and changing the dressing daily. Silver alginate on the wound surface. 2/24; using Keystone antibiotic with silver alginate on the top. He had too much drainage for a total contact cast at one point although I think that is improving and I think in the next week or 2 it might be possible to replace a total contact cast I did not do this today. In general the wound surface looks healthy however he continues to have thick rims of skin and subcutaneous tissue around the wide area of the circumference which I debrided 06/04/2020 upon evaluation today patient appears to be doing well in regard to his wound. I do feel like he is showing signs of improvement. There is little bit of callus and dead tissue around the edges of the wound as well as what appears to be a little bit of a sinus tract that is off to the side laterally I would perform debridement to clear that away today. 3/17; left lateral foot. The wound looks about the same as I remember. Not much depth surface looks healthy. No evidence of infection 3/25; left lateral foot. Wound surface looks about the same. Separating epithelium from the circumference. There really is no evidence of infection here however not making progress by my view 3/29; left lateral foot. Surface of the wound again looks reasonably healthy still thick skin and subcutaneous tissue around the wound margins. There is no evidence of infection. One of the concerns being brought up by the nurses has again the amount of drainage vis--vis continued use of a total contact cast 4/5; left lateral foot at roughly the base of the fifth metatarsal. Nice healthy looking granulated tissue with rims of epithelialization. The overall wound measurements are not any  better but the tissue looks healthy. The only concern is the amount of drainage although he has no surrounding maceration with what we have been doing recently to absorb fluid and protect his skin. He also has lymphedema. 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 He states that the kerlix/coban under the TCC rolled down and could not get it back up. He has been using an offloading scooter and has somehow hurt his right foot using this device. This happened last week. He states that the side of his right foot developed a blister and opened. The top of his foot also has a few  small open wounds he thinks is due to his socks rubbing in his shoes. He has not been using any dressings to the wound. He denies purulent drainage, fever/chills or erythema to the wounds. 4/22; patient presents for 1 week follow-up. He developed new wounds to the right foot that were evaluated at last clinic visit. He continues to have a total contact cast to the left leg and he reports no issues. He has been using silver collagen to the right foot wounds with no issues. He denies purulent drainage, fever/chills or erythema to the right foot wounds. He has no complaints today 4/25; patient presents for 1 week follow-up. He has a total contact cast of the left leg and reports no issues. He has been using silver alginate to the right foot wound. He denies purulent drainage, fever/chills or erythema to the right foot wounds. 5/2 patient presents for 1 week follow-up. T contact cast on the left. The wound which is on the base of the plantar foot at the base of the fifth metatarsal otal actually looks quite good and dimensions continue to gradually contract. HOWEVER the area on the right  lateral fifth metatarsal head is much larger than what I remember from 2 weeks ago. Once more is he has significant levels of hypergranulation. Noteworthy that he had this same hyper granulated response on his wound on the left foot at one point in time. So much so that he I thought there was an underlying fluid collection. Based on this I think this just needs debridement. 5/9; the wound on the left actually continues to be gradually smaller with a healthy surface. Slight amount of drainage and maceration of the skin around but not too bad. However he has a large wound over the right fifth metatarsal head very much in the same configuration as his left foot wound was initially. I used silver nitrate to address the hyper granulated tissue no mechanical debridement 5/16; area on the left foot did not look as healthy this week deeper thick surrounding macerated skin and subcutaneous tissue. The area on the right foot fifth met head was about the same The area on the right ankle that we identified last week is completely broken down into an open wound presumably a stocking rubbing issue 5/23; patient has been using a total contact cast to the left side. He has been using silver alginate underneath. He has also been using silver alginate to the right foot wounds. He has no complaints today. He denies any signs of infection. 5/31; the left-sided wound looks some better measure smaller surface granulation looks better. We have been using silver alginate under the total contact cast The large area on his right fifth met head and right dorsal foot look about the same still using silver alginate 6/6; neither side is good as I was hoping although the surface area dimensions are better. A lot of maceration on his left and right foot around the wound edge. Area on the dorsal right foot looks better. He says he was traveling. I am not sure what does the amount of maceration around the plantar wounds may  be drainage issues 6/13; in general the wound surfaces look quite good on both sides. Macerated skin and raised edges around the wound required debridement although in general especially on the left the surface area seems improved. The area on the right dorsal ankle is about the same I thought this would not be such a problem to close 6/20; not much change  in either wound although the one on the right looks a little better. Both wounds have thick macerated edges to the skin requiring debridements. We have been using silver alginate. The area on the dorsal right ankle is still open I thought this would be closed. 6/28; patient comes in today with a marked deterioration in the right foot wound fifth met head. Wide area of exposed bone this is a drastic change from last time. The area on the left there we have been casting is stagnant. We have been using silver alginate in both wound areas. 7/5; bone culture I did for PCR last time was positive for Pseudomonas, group B strep, Enterococcus and Staph aureus. There was no suggestion of methicillin resistance or ampicillin resistant genes. This was resistant to tetracycline however He comes into the clinic today with the area over his right plantar fifth metatarsal head which had been doing so well 2 weeks ago completely necrotic feeling bone. I do not know that this is going to be salvageable. The left foot wound is certainly no smaller but it has a better surface and is superficial. 7/8; patient called in this morning to say that his total contact cast was rubbing against his foot. He states he is doing fine overall. He denies signs of infection. 7/12; continued deterioration in the wound over the right fifth metatarsal head crumbling bone. This is not going to be salvageable. The patient agrees and wants to be referred to Dr. Doran Durand which we will attempt to arrange as soon as possible. I am going to continue him on antibiotics as long as that takes so I  will renew those today. The area on the left foot which is the base of the fifth metatarsal continues to look somewhat better. Healthy looking tissue no depth no debridement is necessary here. 7/20; the patient was kindly seen by Dr. Doran Durand of orthopedics on 10/19/2020. 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 Towards this end we have taken him out of the total contact cast on the left we will put him in a wrap with Hydrofera Blue. As I understand things surgery is planned for 7/21 7/27; patient had his surgery last Thursday. 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 Apparently everything went well we did not still disturb that today The area on the left foot actually looks quite good. He has been much less mobile which probably explains this he did not seem to do well in the total contact cast secondary to drainage and maceration I think. 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 Fortunately there is no evidence of active infection at this time. No fevers, chills, nausea, vomiting, or diarrhea. Overall I think that he is actually doing extremely well. Nonetheless I do believe that he is staying off of this more following the surgery in his right foot that is the reason the left is doing so great. 8/16; left plantar foot wound. This looks smaller than the last time I saw this he is using Hydrofera Blue. The surgical wound on the right foot is being followed by Dr. Doran Durand we did not look at this today. He has surgical shoes on both feet 8/23; left plantar foot wound not as good this week. Surrounding macerated skin and subcutaneous tissue  everything looks moist and wet. I do not think he is offloading this adequately. He is using a surgical shoe Apparently the right foot surgical wound is not open  although I did not check his foot 8/31; left plantar foot lateral aspect. Much improved this week. He has no maceration. Some improvement in the surface area of the wound but most impressively the depth is come in we are using silver alginate. The patient is a Product/process development scientist. He is asked that we write him a letter so he can go back to work. I have also tried to see if we can write something that will allow him to limit the amount of time that he is on his foot at work. 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 Hopefully the latter can be avoided 9/6; I believe the patient missed an appointment last week. He arrives in today with a wound looking roughly the same certainly no better. Undermining laterally and also inferiorly. We used molecuLight today in training with the patient's permission.. We are using silver alginate 9/21 wound is measuring bigger this week although this may have to do with the aggressive circumferential debridement last week in response to the blush fluorescence on the MolecuLight. Culture I did last week showed significant MSSA and E. coli. I put him on Augmentin but he has not started it yet. We are also going to send this for compounded antibiotics at [facility]. There is no evidence of systemic infection 9/29; silver alginate. His Keystone arrived. He is completing Augmentin in 2 days. Offloading in a cam boot. Moderate drainage per our intake staff 10/5; using silver alginate. He has been using his [place]. He has completed his Augmentin. Per our intake nurse still a lot of drainage, far too much to consider a total contact cast. Wound measures about the same. He had the same undermining area that I defined last week from a roughly 11-3. I remove this today 10/12; using silver alginate he is using the [place]. He comes in for a nurse visit hence we are applying [employer] twice a week.  Measuring slightly better today and less notable drainage. Extensive debridement of the wound edge last time 10/18; using topical Keystone and silver alginate and a soft cast. Wound measurements about the same. Drainage was through his soft cast. We are changing this twice a week Tuesdays and Friday 10/25; comes in with moderate drainage. Still using Keystone silver alginate and a soft cast. Wound dimensions completely the same.He has a lot of edema in the left leg he has lymphedema. Asking for [name] to consider wrapping him as he cannot get his stocking on over the soft cast 11/2; comes in with moderate to large drainage slightly smaller in terms of width we have been using [place]. His wound looks satisfactory but not much improvement 11/4; patient presents today for obligatory cast change. Has no issues or complaints today. He denies signs of infection. 11/9; patient traveled this weekend to [place], was on the cast quite a bit. Staining of the cast with black material from his walking boot. Drainage was not quite as bad as we feared. Using silver alginate and Keystone 11/16; we do not have size for cast therefore we have been putting a soft cast on him since the change on Friday. Still a significant amount of drainage necessitating changing twice a week. We have been using the [place] at cast  changes either hard or soft as well as silver alginate Comes in the clinic with things actually looking fairly good improvement in width. He says his offloading is about the same 02/24/2021 upon evaluation today patient actually comes back in and is doing excellent in regard to his foot ulcer this is significantly smaller even compared to the last visit. The soft cast seems to have done extremely well for him which is great news. I do not see any signs of infection minimal debridement will be needed today. 11/30; left lateral foot much improved half a centimeter improvement in surface area. No evidence of  infection. He seems to be doing better with the soft cast in the TCC therefore we will continue with this. He comes back in later in the week for a change with the nurses. This is due to drainage 12/6; no improvement in dimensions. Under illumination some debris on the surface we have been using silver alginate, soft cast. If there is anything optimistic here he seems to have have less drainage 12/13. Dimensions are improved both length and width and slightly in depth. Appears to be quite healthy today. Raised edges of this thick skin and callus around the edges however. He is in a soft cast were bringing him back once for a change on Friday. Drainage is better 12/20. Dimensions are improved. He still has raised edges of thick skin and callus around the edges. We are using a soft cast 12/28; comes in today with thick callus around the wound. Using silver under alginate under a soft cast. I do not think there is much improvement in any measurement 2023 04/06/2021; patient was put in a total contact cast. Unfortunately not much change in surface area 1/10; not much different still thick callus and skin around the edge in spite of the total contact cast. This was just debrided last week we have been using the [facility] compounded antibiotic and silver alginate under a total contact cast 1/18 the patient's wound on the left side is doing nicely. smaller HOWEVER he comes in today with a wound on the right foot laterally. blister most likely serosangquenous drainage 1/24; the patient continues to do well in terms of the plantar left foot which is continued to contract using silver alginate under the total contact cast HOWEVER the right lateral foot is bigger with denuded skin around the edges. I used pickups and a #15 scalpel to remove this this looks like the remanence of a large blister. Cannot rule out infection. Culture in this area I did last week showed Staphylococcus lugdunensis few colonies. I am  going to try to address this with his [employer] antibiotic that is done so well on the left having linezolid and this should cover the staph 2/1; the patient's wound on his left foot which was the original plantar foot wound thick skin and eschar around the edges even in the total contact cast but the wound surface does not look too bad The real problem is on how his right lateral foot at roughly the base of the fifth metatarsal. The wound is completely necrotic more worrisome than that there is swelling around the edges of this. We have been using silver alginate on both wounds and Keystone on the right foot. Unfortunately I think he is going to require systemic antibiotics while we await cultures. He did not get the x-ray done that we ordered last week [lost the prescription 2/7; disappointingly in the area on the left foot which we are  treating with a total contact cast is still not closed although it is much smaller. He continues to have a lot of callus around the wound edge. -Right lateral foot culture I did last week was negative x-ray also negative for osteomyelitis. 2/15: TCC silver alginate on the left and silver alginate on the right lateral. No real improvement in either area 05/26/2021: T oday, the wounds are roughly the same size as at his previous visit, post-debridement. He continues to endorse fairly substantial drainage, particularly on the right. He has been in a total contact cast on the left. There is still some callus surrounding this lesion. On the right, the periwound skin is quite macerated, along with surrounding callus. The center of the right-sided wound also has some dark, densely adherent material, which is very difficult to remove. 06/02/2021: Today, both wounds are slightly smaller. He has been using zinc oxide ointment around the right ulcer and the degree of maceration has improved markedly. There continues to be an area of nonviable tissue in the center of the right  sided ulcer. The left-sided wound, which has been in the total contact cast. Appears clean and the degree of callus around it is less than previously. 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 He had to take the stairs and both wounds have increased in size. The left foot, which has been in a total contact cast, has developed a tunnel tracking to the lateral aspect of his foot. The nonviable tissue in the center of the right-sided ulcer remains recalcitrant to debridement. There is significant undermining surrounding the entirety of the left sided wound. 06/16/2021: The elevator at school has been fixed and the patient has been able to avoid putting as much weight on his wounds over the past week. We converted the left foot wound into a single lesion today, but despite this, the wound is actually smaller. The base is healthy with limited periwound callus. On the right, the central necrotic area is still present. He continues to be quite macerated around the right sided wound, despite applying barrier cream. This does, however, have the benefit of softening the callus to make it more easily removable. 06/23/2021: Today, the left wound is smaller. The lateral aspect that had opened up previously is now closed. The wound base has a healthy bed of granulation tissue and minimal slough. Unfortunately, on the right, the wound is larger and continues to be fairly macerated. He has also reopened the wound at his right ankle. He thinks this is due to the gait he has adopted secondary to his total contact cast and boot. 06/30/2021: T oday, both wounds are a little bit larger. The lateral aspect on the left has remained closed. He continues to have significant periwound maceration. The culture that I took from the right sided wound grew a population of bacteria that is not covered by his current [facility] antibiotic. The center of the right- sided  wound continues to appear necrotic with nonviable fat. It probes deeper today, but does not reach bone. 07/07/2021: The periwound maceration is a little bit less today. The right lateral foot wound has some areas that appear more viable and the necrotic center also looks a little bit better. The wound on the dorsal surface of his right foot near the ankle is contracting and the surface appears healthy. The left plantar wound surface looks healthy, but there is some new undermining on the medial portion. He  did get his new Keystone antibiotic and began applying that to the right foot wound on Saturday. 07/14/2021: The intake nurse reported substantial drainage from his wounds, but the periwound skin actually looks better than is typical for him. The wound on the dorsal surface of his right foot near the ankle is smaller and just has a small open area underneath some dried eschar. The left plantar wound surface looks healthy and there has been no significant accumulation of callus. The right lateral foot wound looks quite a bit better, with the central portion, which typically appears necrotic, looking more viable albeit pale. 07/22/2021: His left foot is extremely macerated today. The wound is about the same size. The wound on the dorsal surface of his right foot near the ankle had closed, but he traumatized it removing the dressing and there is a tiny skin tear in that location. The right lateral foot wound is bigger, but the surface appears healthy. 07/30/2021: The wound on the dorsal surface of his right foot near the ankle is closed. The right lateral foot wound again is a little bit bigger due to some undermining. The periwound skin is in better condition, however. He has been applying zinc oxide. The wound surface is a little bit dry today. On the left, he does not have the substantial maceration that we frequently see. The wound itself is smaller and has a clean surface. 08/06/2021: Both wounds seem to  have deteriorated over the past week. The right lateral foot wound has a dry surface but the periwound is boggy.. Overall wound dimensions are about the same. On the left, the wound is about the same size, but there is more undermining present underneath periwound callus. 08/13/2021: The right sided wound looks about the same, but on the left there has been substantial deterioration. The undermining continues to extend under periwound callus. Once this was removed, substantial extension of the wound was present. There is no odor or purulent drainage but clearly the wounds have broken down. 08/20/2021: The wounds look about the same today. He has been out of his total contact cast and has just been changing the dressings using topical Keystone with PolyMem Ag, Kerlix and Ace bandages. The wound on the top of his right ankle has reopened but this is quite small. There was a little bit of purulent material that I expressed when examining this wound. 08/24/2021: After the aggressive debridement I performed at his last visit, the wounds actually look a little bit better today. They are smaller with the exception of the wound on the top of his right ankle which is a little bit bigger as some more skin pulled off when he was changing his dressing. We are using topical Keystone with PolyMem Ag Kerlix and Ace bandages. 09/02/2021: There has been really no change to any of his wounds. 09/16/2021: The patient was hospitalized last week with nausea, vomiting, and dehydration. He says that while he was in the hospital, his wounds were not really addressed properly. T oday, both plantar foot wounds are larger and the periwound skin is macerated. The wound on the dorsum of his right foot has a scab on the top. The right foot now has a crater where previously he had had nonviable fat. It looks as though this simply died and fell out. The periwound callus is wet. 09/24/2021: His wounds have deteriorated somewhat since his  last visit. The wound on the dorsum of his right foot near his ankle is larger and has  more nonviable tissue present. The crater in his right foot is even deeper; I cannot quite palpate or probe to bone but I am sure it is close. The wound on his left plantar foot has an odd boggy area in the center that almost feels as though it has fluid within it. He has run out of his topical Keystone antibiotic. We are using silver alginate on his wounds. 09/29/2021: He has developed a new wound on the dorsum of his left foot near his ankle. He says he thinks his wrapping is rubbing in that site. I would concur with this as the wound on his right ankle is larger. The left foot looks about the same. The right foot has the crater that was present last week. No significant slough accumulation, but his foot remains quite swollen and warm despite oral antibiotic therapy. 10/08/2021: All of his wounds look about the same as last week. He did not start his oral antibiotics that are prescribed until just a couple of days ago; his [employer] compounded antibiotics formula has been changed and he is awaiting delivery of the new recipe. His MRI that was scheduled for earlier this week was canceled as no prior authorization had been obtained; unfortunately the tech responsible sent an email to my old [employer] email, which I no longer use nor have access to. 10/18/2021: The wounds on his bilateral dorsal feet near the ankles are both improved. They are smaller and have just some eschar and slough buildup. The left plantar wound has a fair amount of undermining, but the surface is clean. There is some periwound callus accumulation. On the right plantar foot, there is nonviable fat leading to a deep tunnel that tracks towards his dorsal medial foot. There is periwound callus and slough accumulation, as well. His right foot and leg remain swollen as compared to the left. Electronic Signature(s) Signed: 10/18/2021 1:34:05 PM By:  [name] MD FACS Previous Signature: 10/18/2021 1:33:46 PM Version By: [name] MD FACS Entered By: [name] on 10/18/2021 13:34:05 -------------------------------------------------------------------------------- Physical Exam Details Patient Name: Date of Service: [name], [name] 10/18/2021 12:45 PM Medical Record Number: [id number] Patient Account Number: [id number] Date of Birth/Sex: Treating RN: Aug 16, 1985 (36 y.o. [name] Primary Care Provider: [name] Other Clinician: Referring Provider: Treating Provider/Extender: [name] in Treatment: 103 Constitutional . . . . No acute distress.Marland Kitchen Respiratory Normal work of breathing on room air.. Notes 10/18/2021: The wounds on his bilateral dorsal feet near the ankles are both improved. They are smaller and have just some eschar and slough buildup. The left plantar wound has a fair amount of undermining, but the surface is clean. There is some periwound callus accumulation. On the right plantar foot, there is nonviable fat leading to a deep tunnel that tracks towards his dorsal medial foot. There is periwound callus and slough accumulation, as well. His right foot and leg remain swollen as compared to the left. Electronic Signature(s) Signed: 10/18/2021 1:34:58 PM By: [name] MD FACS Entered By: [name] on 10/18/2021 13:34:58 -------------------------------------------------------------------------------- Physician Orders Details Patient Name: Date of Service: [name], [name] 10/18/2021 12:45 PM Medical Record Number: [id number] Patient Account Number: [id number] Date of Birth/Sex: Treating RN: [birth year] (36 y.o. 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 - Dr. [name] - Room 1 with [name] Return A Monday 10/25/21 @ 1530 Bathing/ Shower/ Hygiene May shower with protection but do not get wound dressing(s) wet. - Use Cast Protector Bag on Right and left legs Edema Control - Lymphedema / SCD / Other Bilateral Lower Extremities Elevate legs to the level of the heart or above for 30 minutes daily and/or when sitting, a frequency of: - throughout the day Avoid standing for long periods of time. Exercise regularly Compression stocking or Garment 20-30 mm/Hg pressure to: - to both legs daily Off-Loading Open toe surgical shoe to: - right foot Additional Orders / Instructions Follow Nutritious Diet Wound Treatment Wound #11 - Foot Wound Laterality: Right, Anterior Cleanser: Soap and Water 1 x Per Day/30 Days Discharge Instructions: May shower and wash wound with dial antibacterial soap and water prior to dressing change. Cleanser: Byram Ancillary Kit - 15 Day Supply (Generic) 1 x Per Day/30 Days Discharge Instructions: Use supplies as instructed; Kit contains: (15) Saline Bullets; (15) 3x3 Gauze; 15 pr Gloves Topical: keystone antibiotic compound 1 x Per Day/30 Days Prim Dressing: KerraCel Ag Gelling Fiber Dressing, 4x5 in (silver alginate) (Dispense As Written) 1 x Per Day/30 Days ary Discharge Instructions: Apply silver alginate to wound bed as instructed Secondary Dressing: ABD Pad, 8x10 (Generic) 1 x Per Day/30 Days Discharge Instructions: Apply over primary dressing as directed. Secondary  Dressing: Optifoam Non-Adhesive Dressing, 4x4 in (Generic) 1 x Per Day/30 Days Discharge Instructions: Apply over primary dressing as directed. Secondary Dressing: Zetuvit Plus 4x8 in (Dispense As Written) 1 x Per Day/30 Days Discharge Instructions: Apply over primary dressing as directed. Secondary Dressing: NonWoven Sponge, 4x4 (in/in) (Generic) 1 x Per Day/30 Days Secured With: Coban Self-Adherent Wrap 4x5 (in/yd) (Generic) 1 x Per Day/30 Days Discharge Instructions: Secure with Coban as directed. Secured With: [employer], 4.5x3.1 (in/yd) (Generic) 1 x Per Day/30 Days Discharge Instructions: Secure with Kerlix as directed. Secured With: 102M Medipore H Soft Cloth Surgical T ape, 4 x 10 (in/yd) (Generic) 1 x Per Day/30 Days Discharge Instructions: Secure with tape as directed. Wound #12 - Foot Wound Laterality: Dorsal, Left Cleanser: Soap and Water 1 x Per Day/30 Days Discharge Instructions: May shower and wash wound with dial antibacterial soap and water prior to dressing change. Cleanser: Byram Ancillary Kit - 15 Day Supply (Generic) 1 x Per Day/30 Days Discharge Instructions: Use supplies as instructed; Kit contains: (15) Saline Bullets; (15) 3x3 Gauze; 15 pr Gloves Topical: keystone antibiotic compound 1 x Per Day/30 Days Prim Dressing: KerraCel Ag Gelling Fiber Dressing, 4x5 in (silver alginate) (Dispense As Written) 1 x Per Day/30 Days ary Discharge Instructions: Apply silver alginate to wound bed as instructed Secondary Dressing: ABD Pad, 8x10 (Generic) 1 x Per Day/30 Days Discharge Instructions: Apply over primary dressing as directed. Secondary Dressing: NonWoven Sponge, 4x4 (in/in) (Generic) 1 x Per Day/30 Days Secured With: Coban Self-Adherent Wrap 4x5 (in/yd) (Generic) 1 x Per Day/30 Days Discharge Instructions: Secure with Coban as directed. Secured With: [employer], 4.5x3.1 (in/yd) (Generic) 1 x Per Day/30 Days Discharge Instructions: Secure with Kerlix as  directed. Secured With: 102M Medipore H Soft  Cloth Surgical T ape, 4 x 10 (in/yd) (Generic) 1 x Per Day/30 Days Discharge Instructions: Secure with tape as directed. Wound #3 - Foot Wound Laterality: Plantar, Left, Lateral Cleanser: Soap and Water 1 x Per Day/30 Days Discharge Instructions: May shower and wash wound with dial antibacterial soap and water prior to dressing change. Cleanser: Byram Ancillary Kit - 15 Day Supply (Generic) 1 x Per Day/30 Days Discharge Instructions: Use supplies as instructed; Kit contains: (15) Saline Bullets; (15) 3x3 Gauze; 15 pr Gloves Topical: keystone antibiotic compound 1 x Per Day/30 Days Prim Dressing: KerraCel Ag Gelling Fiber Dressing, 4x5 in (silver alginate) (Dispense As Written) 1 x Per Day/30 Days ary Discharge Instructions: Apply silver alginate to wound bed as instructed Secondary Dressing: ABD Pad, 8x10 (Generic) 1 x Per Day/30 Days Discharge Instructions: Apply over primary dressing as directed. Secondary Dressing: Optifoam Non-Adhesive Dressing, 4x4 in (Generic) 1 x Per Day/30 Days Discharge Instructions: Apply over primary dressing as directed. Secondary Dressing: Zetuvit Plus 4x8 in (Generic) 1 x Per Day/30 Days Discharge Instructions: Apply over primary dressing as directed. Secondary Dressing: NonWoven Sponge, 4x4 (in/in) (Generic) 1 x Per Day/30 Days Secured With: Coban Self-Adherent Wrap 4x5 (in/yd) (Generic) 1 x Per Day/30 Days Discharge Instructions: Secure with Coban as directed. Secured With: [employer], 4.5x3.1 (in/yd) (Generic) 1 x Per Day/30 Days Discharge Instructions: Secure with Kerlix as directed. Secured With: 101M Medipore H Soft Cloth Surgical T ape, 4 x 10 (in/yd) (Generic) 1 x Per Day/30 Days Discharge Instructions: Secure with tape as directed. Wound #8 - Foot Wound Laterality: Right, Lateral Cleanser: Soap and Water 1 x Per Day/30 Days Discharge Instructions: May shower and wash wound with dial antibacterial soap  and water prior to dressing change. Cleanser: Byram Ancillary Kit - 15 Day Supply (Generic) 1 x Per Day/30 Days Discharge Instructions: Use supplies as instructed; Kit contains: (15) Saline Bullets; (15) 3x3 Gauze; 15 pr Gloves Topical: keystone antibiotic compound 1 x Per Day/30 Days Prim Dressing: KerraCel Ag Gelling Fiber Dressing, 4x5 in (silver alginate) (Dispense As Written) 1 x Per Day/30 Days ary Discharge Instructions: Apply silver alginate to wound bed as instructed Secondary Dressing: ABD Pad, 8x10 (Generic) 1 x Per Day/30 Days Discharge Instructions: Apply over primary dressing as directed. Secondary Dressing: NonWoven Sponge, 4x4 (in/in) (Generic) 1 x Per Day/30 Days Secured With: Coban Self-Adherent Wrap 4x5 (in/yd) (Generic) 1 x Per Day/30 Days Discharge Instructions: Secure with Coban as directed. Secured With: [employer], 4.5x3.1 (in/yd) (Generic) 1 x Per Day/30 Days Discharge Instructions: Secure with Kerlix as directed. Secured With: 101M Medipore H Soft Cloth Surgical T ape, 4 x 10 (in/yd) (Generic) 1 x Per Day/30 Days Discharge Instructions: Secure with tape as directed. Services and Therapies Venous Duplex Doppler - Stat, Right leg swelling, R/O DVT. Electronic Signature(s) Signed: 10/18/2021 3:35:15 PM By: [name] MD FACS Entered By: [name] on 10/18/2021 13:35:18 Prescription 10/18/2021 -------------------------------------------------------------------------------- [place], [name] Graysin Luczynski MD Patient Name: Provider: 17-May-1985 [id number] Date of Birth: NPI#: Jerilynn Mages [id number] Sex: DEA #: [phone] Phone #: License #: [place] Patient Address: [facility] [address] [place], [place] [address] [place], [place] [address] [phone] Allergies metformin Provider's Orders Venous Duplex Doppler - Stat, Right leg swelling, R/O DVT. Hand  Signature: Date(s): Electronic Signature(s) Signed: 10/18/2021 1:37:58 PM By: [name] MD FACS Entered By: [name] on 10/18/2021 13:37:57 -------------------------------------------------------------------------------- Problem List Details Patient Name: Date of Service: A  RMSTRO NG, [name] 10/18/2021 12:45 PM Medical Record Number: [id number] Patient Account Number: [id number] Date of Birth/Sex: Treating RN: 02-Dec-1985 (36 y.o. 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 08/10/2020 No Yes L97.322 Non-pressure chronic ulcer of left ankle with fat layer exposed 09/29/2021 No Yes Inactive Problems ICD-10 Code Description Active Date Inactive Date L97.518 Non-pressure chronic ulcer of other part of right foot with other specified severity 07/14/2020 07/14/2020 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, right ankle and foot 10/24/2019 10/24/2019 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, left ankle and foot 10/24/2019 10/24/2019 B95.62 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 10/24/2019 10/24/2019 classified elsewhere Resolved Problems Electronic Signature(s) Signed: 10/18/2021 1:27:36 PM By: [name] MD FACS Entered By: [name] on 10/18/2021 13:27:36 -------------------------------------------------------------------------------- Progress Note  Details Patient Name: Date of Service: [name], [name] 10/18/2021 12:45 PM Medical Record Number: [id number] Patient Account Number: [id number] Date of Birth/Sex: Treating RN: 1985-08-18 (36 y.o. 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 He comes in for review of a wound over the plantar fifth metatarsal head extending into the lateral part of the foot. He was followed for this previously by his podiatrist Dr. Cornelius Moras. As the patient tells his story he went to see podiatry first for a swelling he developed on the lateral part of his fifth metatarsal head in May. He states this was "open" by podiatry and the area closed. He was followed up in June and it was again opened callus removed and it closed promptly. There were plans being made for surgery on the fifth metatarsal head in June however his blood sugar was apparently too high for anesthesia. Apparently the area was debrided and opened again in June and it is never closed since. Looking over the records from podiatry I am really not able to follow this. It was clear when he was first seen it was before 5/14 at that point he already had a wound. By 5/17 the ulcer was resolved. I do not see anything about a procedure. On 5/28 noted to have pre-ulcerative moderate keratosis. X-ray noted 1/5 contracted toe and tailor's bunion and metatarsal  deformity. On a visit date on 09/28/2018 the dorsal part of the left foot it healed and resolved. There was concern about swelling in his lower extremity he was sent to the ER.. As far as I can tell he was seen in the ER on 7/12 with an ulcer on his left foot. A DVT rule out of the left leg was negative. I do not think I  have complete records from podiatry but I am not able to verify the procedures this patient states he had. He states after the last procedure the wound has never closed although I am not able to follow this in the records I have from podiatry. He has not had a recent x-ray The patient has been using Neosporin on the wound. He is wearing a Darco shoe. He is still very active up on his foot working and exercising. Past medical history; type 2 diabetes ketosis-prone, leg swelling with a negative DVT study in July. Non-smoker ABI in our clinic was 0.85 on the left 10/16; substantial wound on the plantar left fifth met head extending laterally almost to the dorsal fifth MTP. We have been using silver alginate we gave him a Darco forefoot off loader. An x-ray did not show evidence of osteomyelitis did note soft tissue emphysema which I think was due to gas tracking through an open wound. There is no doubt in my mind he requires an MRI 10/23; MRI not booked until 3 November at the earliest this is largely due to his glucose sensor in the right arm. We have been using silver alginate. There has been an improvement 10/29; I am still not exactly sure when his MRI is booked for. He says it is the third but it is the 10th in epic. This definitely needs to be done. He is running a low-grade fever today but no other symptoms. No real improvement in the 1 02/26/2019 patient presents today for a follow-up visit here in our clinic he is last been seen in the clinic on October 29. Subsequently we were working on getting MRI to evaluate and see what exactly was going on and where we would need to go  from the standpoint of whether or not he had osteomyelitis and again what treatments were going be required. Subsequently the patient ended up being admitted to the hospital on 02/07/2019 and was discharged on 02/14/2019. This is a somewhat interesting admission with a discharge diagnosis of pneumonia due to COVID-19 although he was positive for COVID-19 when tested at the urgent care but negative x2 when he was actually in the hospital. With that being said he did have acute respiratory failure with hypoxia and it was noted he also have a left foot ulceration with osteomyelitis. With that being said he did require oxygen for his pneumonia and I level 4 L. He was placed on antivirals and steroids for the COVID-19. He was also transferred to the [facility] at one point. Nonetheless he did subsequently discharged home and since being home has done much better in that regard. The CT angiogram did not show any pulmonary embolism. With regard to the osteomyelitis the patient was placed on vancomycin and Zosyn while in the hospital but has been changed to Augmentin at discharge. It was also recommended that he follow- up with wound care and podiatry. Podiatry however wanted him to see [name] according to the patient prior to them doing anything further. His hemoglobin A1c was 9.9 as noted in the hospital. Have an MRI of the left foot performed while in the hospital on 02/04/2019. 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 There is an overlying plantar open wound noted an abscess tracking back along the lateral aspect of the fifth metatarsal shaft. There is otherwise diffuse cellulitis and mild fasciitis without findings of polymyositis. The patient did have  recently pneumonia secondary to COVID-19 I looked in the chart through epic and it does appear that the patient may need to have an additional x-ray just to  ensure everything is cleared and that he has no airspace disease prior to putting him into the [name]. 03/05/2019; patient was readmitted to the clinic last week. He was hospitalized twice for a viral upper respiratory tract infection from 11/1 through 11/4 and then 11/5 through 11/12 ultimately this turned out to be Covid pneumonitis. Although he was discharged on oxygen he is not using it. He says he feels fine. He has no exercise limitation no cough no sputum. His O2 sat in our clinic today was 100% on room air. 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 He received Vanco and Zosyn in the hospital and then was discharged on 2 weeks of Augmentin. I do not see any relevant cultures. He was supposed to follow-up with infectious disease but I do not see that he has an appointment. 12/8; patient saw Dr. Novella Olive of infectious disease last week. He felt that he had had adequate antibiotic therapy. He did not go to follow-up with Dr. Amalia Hailey of podiatry and I have again talked to him about the pros and cons of this. He does not want to consider a ray amputation of this time. He is aware of the risks of recurrence, migration etc. He started HBO today and tolerated this well. He can complete the Augmentin that I gave him last week. I have looked over the lab work that Dr. Chana Bode ordered his C-reactive protein was 3.3 and his sedimentation rate was 17. The C-reactive protein is never really been measurably that high in this patient 12/15; not much change in the wound today however he has undermining along the lateral part of the foot again more extensively than last week. He has some rims of epithelialization. We have been using silver alginate. He is undergoing hyperbarics but did not dive today 12/18; in for his obligatory first total contact cast change. Unfortunately there was pus coming from  the undermining area around his fifth metatarsal head. This was cultured but will preclude reapplication of a cast. He is seen in conjunction with HBO 12/24; patient had staph lugdunensis in the wound in the undermining area laterally last time. We put him on doxycycline which should have covered this. The wound looks better today. I am going to give him another week of doxycycline before reattempting the total contact cast 12/31; the patient is completing antibiotics. Hemorrhagic debris in the distal part of the wound with some undermining distally. He also had hyper granulation. Extensive debridement with a #5 curette. The infected area that was on the lateral part of the fifth met head is closed over. I do not think he needs any more antibiotics. Patient was seen prior to HBO. Preparations for a total contact cast were made in the cast will be placed post hyperbarics 04/11/19; once again the patient arrives today without complaint. He had been in a cast all week noted that he had heavy drainage this week. This resulted in large raised areas of macerated tissue around the wound 1/14; wound bed looks better slightly smaller. Hydrofera Blue has been changing himself. He had a heavy drainage last week which caused a lot of maceration around the wound so I took him out of a total contact cast he says the  drainage is actually better this week He is seen today in conjunction with HBO 1/21; returns to clinic. He was up in [place] for a day or 2 attending a funeral. He comes back in with the wound larger and with a large area of exposed bone. He had osteomyelitis and septic arthritis of the fifth left metatarsal head while he was in hospital. He received IV antibiotics in the hospital for a prolonged period of time then 3 weeks of Augmentin. Subsequently I gave him 2 weeks of doxycycline for more superficial wound infection. When I saw this last week the wound was smaller the surface of the wound looks  satisfactory. 1/28; patient missed hyperbarics today. Bone biopsy I did last time showed Enterococcus faecalis and Staphylococcus lugdunensis . He has a wide area of exposed bone. We are going to use silver alginate as of today. I had another ethical discussion with the patient. 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 In this situation I think the likelihood of healing this is low. Therefore I have recommended a ray amputation and with the patient's agreement I have referred him to Dr. Doran Durand. The other issue is that his compliance with hyperbarics has been minimal because of his work schedule and given his underlying decision I am going to stop this today READMISSION 10/24/2019 MRI 09/29/2019 left foot IMPRESSION: 1. Apparent skin ulceration inferior and lateral to the 5th metatarsal base with underlying heterogeneous T2 signal and enhancement in the subcutaneous fat. Small peripherally enhancing fluid collections along the plantar and lateral aspects of the 5th metatarsal base suspicious for abscesses. 2. Interval amputation through the mid 5th metatarsal with nonspecific low-level marrow edema and enhancement. Given the proximity to the adjacent soft tissue inflammatory changes, osteomyelitis cannot be excluded. 3. The additional bones appear unremarkable. MRI 09/29/2019 right foot IMPRESSION: 1. Soft tissue ulceration lateral to the 5th MTP joint. There is low-level T2 hyperintensity within the 4th and 5th metatarsal heads and adjacent proximal phalanges without abnormal T1 signal or cortical destruction. These findings are nonspecific and could be seen with early marrow edema, hyperemia or early osteomyelitis. No evidence of septic joint. 2. Mild tenosynovitis and synovial enhancement associated with the extensor digitorum tendons at the level of the midfoot. 3. Diffuse low-level muscular T2 hyperintensity and enhancement, most consistent with  diabetic myopathy. LEFT FOOT BONE Methicillin resistant staphylococcus aureus Staphylococcus lugdunensis MIC MIC CIPROFLOXACIN >=8 RESISTANT Resistant <=0.5 SENSI... Sensitive CLINDAMYCIN <=0.25 SENS... Sensitive >=8 RESISTANT Resistant ERYTHROMYCIN >=8 RESISTANT Resistant >=8 RESISTANT Resistant GENTAMICIN <=0.5 SENSI... Sensitive <=0.5 SENSI... Sensitive Inducible Clindamycin NEGATIVE Sensitive NEGATIVE Sensitive OXACILLIN >=4 RESISTANT Resistant 2 SENSITIVE Sensitive RIFAMPIN <=0.5 SENSI... Sensitive <=0.5 SENSI... Sensitive TETRACYCLINE <=1 SENSITIVE Sensitive <=1 SENSITIVE Sensitive TRIMETH/SULFA <=10 SENSIT Sensitive <=10 SENSIT Sensitive ... Marland Kitchen.. VANCOMYCIN 1 SENSITIVE Sensitive <=0.5 SENSI... Sensitive Right foot bone . Component 3 wk ago Specimen Description BONE Special Requests RIGHT 4 METATARSAL SAMPLE B Gram Stain NO WBC SEEN NO ORGANISMS SEEN Culture RARE METHICILLIN RESISTANT STAPHYLOCOCCUS AUREUS NO ANAEROBES ISOLATED Performed at [facility], [place] [address] [place], [place] [address] Report Status 10/08/2019 FINAL Organism ID, Bacteria METHICILLIN RESISTANT STAPHYLOCOCCUS AUREUS Resulting Agency [facility] Susceptibility Methicillin resistant staphylococcus aureus MIC CIPROFLOXACIN >=8 RESISTANT Resistant CLINDAMYCIN <=0.25 SENS... Sensitive ERYTHROMYCIN >=8 RESISTANT Resistant GENTAMICIN <=0.5 SENSI... Sensitive Inducible Clindamycin NEGATIVE Sensitive OXACILLIN >=4 RESISTANT Resistant RIFAMPIN <=0.5 SENSI... Sensitive TETRACYCLINE <=1 SENSITIVE Sensitive TRIMETH/SULFA <=10 SENSIT Sensitive ... VANCOMYCIN 1 SENSITIVE Sensitive This is a patient  we had in clinic earlier this year with a wound over his left fifth metatarsal head. 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 Doran Durand on 05/16/2019. He seems to have developed problems with wounds on his bilateral feet in June 2021 although he may have had problems earlier than this. He was in an urgent care with a right foot ulcer on 09/26/2019 and given a course of doxycycline. This was apparently after having trouble getting into see orthopedics. He was seen by podiatry on 09/28/2019 noted to have bilateral lower extremity ulcers including the left lateral fifth metatarsal base and the right subfifth met head. It was noted that had purulent drainage at that time. He required hospitalization from 6/20 through 7/2. This was because of worsening right foot wounds. He underwent bilateral operative incision and drainage and bone biopsies bilaterally. Culture results are listed above. He has been referred back to clinic by Dr. Jacqualyn Posey of podiatry. He is also followed by Dr. Megan Salon who saw him yesterday. He was discharged from hospital on Zyvox Flagyl and Levaquin and yesterday changed to doxycycline Flagyl and Levaquin. His inflammatory markers on 6/26 showed a sedimentation rate of 129 and a C-reactive protein of 5. This is improved to 14 and 1.3 respectively. This would indicate improvement. ABIs in our clinic today were 1.23 on the right and 1.20 on the left 11/01/2019 on evaluation today patient appears to be doing fairly well in regard to the wounds on his feet at this point. Fortunately there is no signs of active infection at this time. No fevers, chills, nausea, vomiting, or diarrhea. He currently is seeing infectious disease and still under their care at this point. Subsequently he also has both wounds which she has not been using collagen on as he did not receive that in his packaging he did not call us and let us know that. Apparently that just was missed on the order. Nonetheless we will get that straightened out  today. 8/9-Patient returns for bilateral foot wounds, using Prisma with hydrogel moistened dressings, and the wounds appear stable. Patient using surgical shoes, avoiding much pressure or weightbearing as much as possible 8/16; patient has bilateral foot wounds. 1 on the right lateral foot proximally the other is on the left mid lateral foot. Both required debridement of callus and thick skin around the wounds. We have been using silver collagen 8/27; patient has bilateral lateral foot wounds. The area on the left substantially surrounded by callus and dry skin. This was removed from the wound edge. The underlying wound is small. The area on the right measured somewhat smaller today. 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 Unfortunately I did not update his antibiotics during today's visit. 9/10 I reviewed Dr. Hale Bogus last notes he felt he had completed antibiotics his inflammatory markers were reasonably well controlled. He has a small wound on the lateral left foot and a tiny area on the right which is just above closed. He is using Hydrofera Blue with border foam he has bilateral surgical shoes 9/24; 2 week f/u. doing well. right foot is closed. left foot still undermined. 10/14; right foot remains closed at the fifth met head. The area over the  base of the left fifth metatarsal has a small open area but considerable undermining towards the plantar foot. Thick callus skin around this suggests an adequate pressure relief. We have talked about this. He says he is going to go back into his cam boot. I suggested a total contact cast he did not seem enamored with this suggestion 10/26; left foot base of the fifth metatarsal. Same condition as last time. He has skin over the area with an open wound however the skin is not adherent. He went to see Dr. Earleen Newport who did an x-ray and culture of his foot I have not reviewed the x-ray but  the patient was not told anything. He is on doxycycline 11/11; since the patient was last here he was in the emergency room on 10/30 he was concerned about swelling in the left foot. They did not do any cultures or x-rays. They changed his antibiotics to cephalexin. Previous culture showed group B strep. The cephalexin is appropriate as doxycycline has less than predictable coverage. Arrives in clinic today with swelling over this area under the wound. He also has a new wound on the right fifth metatarsal head 11/18; the patient has a difficult wound on the lateral aspect of the left fifth metatarsal head. The wound was almost ballotable last week I opened it slightly expecting to see purulence however there was just bleeding. I cultured this this was negative. X-ray unchanged. We are trying to get an MRI but I am not sure were going to be able to get this through his insurance. He also has an area on the right lateral fifth metatarsal head this looks healthier 12/3; the patient finally got our MRI. Surprisingly this did not show osteomyelitis. I did show the soft tissue ulceration at the lateral plantar aspect of the fifth metatarsal base with a tiny residual 6 mm abscess overlying the superficial fascia I have tried to culture this area I have not been able to get this to grow anything. Nevertheless the protruding tissue looks aggravated. I suspect we should try to treat the underlying "abscess with broad-spectrum antibiotics. I am going to start him on Levaquin and Flagyl. He has much less edema in his legs and I am going to continue to wrap his legs and see him weekly 12/10. I started Levaquin and Flagyl on him last week. He just picked up the Flagyl apparently there was some delay. The worry is the wound on the left fifth metatarsal base which is substantial and worsening. His foot looks like he inverts at the ankle making this a weightbearing surface. Certainly no improvement in fact I think the  measurements of this are somewhat worse. We have been using 12/17; he apparently just got the Levaquin yesterday this is 2 weeks after the fact. He has completed the Flagyl. The area over the left fifth metatarsal base still has protruding granulation tissue although it does not look quite as bad as it did some weeks ago. He has severe bilateral lymphedema although we have not been treating him for wounds on his legs this is definitely going to require compression. There was so much edema in the left I did not wish to put him in a total contact cast today. I am going to increase his compression from 3-4 layer. The area on the right lateral fifth met head actually look quite good and superficial. 12/23; patient arrived with callus on the right fifth met head and the substantial hyper granulated callused wound on the  base of his fifth metatarsal. He says he is completing his Levaquin in 2 days but I do not think that adds up with what I gave him but I will have to double check this. We are using Hydrofera Blue on both areas. My plan is to put the left leg in a cast the week after New Year's 04/06/2020; patient's wounds about the same. Right lateral fifth metatarsal head and left lateral foot over the base of the fifth metatarsal. There is undermining on the left lateral foot which I removed before application of total contact cast continuing with Hydrofera Blue new. Patient tells me he was seen by endocrinology today lab work was done [Dr. Kerr]. Also wondering whether he was referred to cardiology. I went over some lab work from previously does not have chronic renal failure certainly not nephrotic range proteinuria he does have very poorly controlled diabetes but this is not his most updated lab work. Hemoglobin A1c has been over 11 1/10; the patient had a considerable amount of leakage towards mid part of his left foot with macerated skin however the wound surface looks better the area on the right  lateral fifth met head is better as well. I am going to change the dressing on the left foot under the total contact cast to silver alginate, continue with Hydrofera Blue on the right. 1/20; patient was in the total contact cast for 10 days. Considerable amount of drainage although the skin around the wound does not look too bad on the left foot. The area on the right fifth metatarsal head is closed. Our nursing staff reports large amount of drainage out of the left lateral foot wound 1/25; continues with copious amounts of drainage described by our intake staff. PCR culture I did last week showed E. coli and Enterococcus faecalis and low quantities. Multiple resistance genes documented including extended spectrum beta lactamase, MRSA, MRSE, quinolone, tetracycline. The wound is not quite as good this week as it was 5 days ago but about the same size 2/3; continues with copious amounts of malodorous drainage per our intake nurse. The PCR culture I did 2 weeks ago showed E. coli and low quantities of Enterococcus. There were multiple resistance genes detected. I put Neosporin on him last week although this does not seem to have helped. The wound is slightly deeper today. Offloading continues to be an issue here although with the amount of drainage she has a total contact cast is just not going to work 2/10; moderate amount of drainage. Patient reports he cannot get his stocking on over the dressing. I told him we have to do that the nurse gave him suggestions on how to make this work. The wound is on the bottom and lateral part of his left foot. Is cultured predominantly grew low amounts of Enterococcus, E. coli and anaerobes. There were multiple resistance genes detected including extended spectrum beta lactamase, quinolone, tetracycline. I could not think of an easy oral combination to address this so for now I am going to do topical antibiotics provided by [facility] I think the main agents here are  vancomycin and an aminoglycoside. We have to be able to give him access to the wounds to get the topical antibiotic on 2/17; moderate amount of drainage this is unchanged. He has his Keystone topical antibiotic against the deep tissue culture organisms. He has been using this and changing the dressing daily. Silver alginate on the wound surface. 2/24; using Keystone antibiotic with silver alginate on  the top. He had too much drainage for a total contact cast at one point although I think that is improving and I think in the next week or 2 it might be possible to replace a total contact cast I did not do this today. In general the wound surface looks healthy however he continues to have thick rims of skin and subcutaneous tissue around the wide area of the circumference which I debrided 06/04/2020 upon evaluation today patient appears to be doing well in regard to his wound. I do feel like he is showing signs of improvement. There is little bit of callus and dead tissue around the edges of the wound as well as what appears to be a little bit of a sinus tract that is off to the side laterally I would perform debridement to clear that away today. 3/17; left lateral foot. The wound looks about the same as I remember. Not much depth surface looks healthy. No evidence of infection 3/25; left lateral foot. Wound surface looks about the same. Separating epithelium from the circumference. There really is no evidence of infection here however not making progress by my view 3/29; left lateral foot. Surface of the wound again looks reasonably healthy still thick skin and subcutaneous tissue around the wound margins. There is no evidence of infection. One of the concerns being brought up by the nurses has again the amount of drainage vis--vis continued use of a total contact cast 4/5; left lateral foot at roughly the base of the fifth metatarsal. Nice healthy looking granulated tissue with rims of  epithelialization. The overall wound measurements are not any better but the tissue looks healthy. The only concern is the amount of drainage although he has no surrounding maceration with what we have been doing recently to absorb fluid and protect his skin. He also has lymphedema. 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 He states that the kerlix/coban under the TCC rolled down and could not get it back up. He has been using an offloading scooter and has somehow hurt his right foot using this device. This happened last week. He states that the side of his right foot developed a blister and opened. The top of his foot also has a few small open wounds he thinks is due to his socks rubbing in his shoes. He has not been using any dressings to the wound. He denies purulent drainage, fever/chills or erythema to the wounds. 4/22; patient presents for 1 week follow-up. He developed new wounds to the right foot that were evaluated at last clinic visit. He continues to have a total contact cast to the left leg and he reports no issues. He has been using silver collagen to the right foot wounds with no issues. He denies purulent drainage, fever/chills or erythema to the right foot wounds. He has no complaints today 4/25; patient presents for 1 week follow-up. He has a total contact cast of the left leg and reports no issues. He has been using silver alginate to the right foot wound. He denies purulent drainage, fever/chills or erythema to the right foot wounds. 5/2 patient presents for 1 week follow-up. T contact cast on the left. The wound which is on the base of  the plantar foot at the base of the fifth metatarsal otal actually looks quite good and dimensions  continue to gradually contract. HOWEVER the area on the right lateral fifth metatarsal head is much larger than what I remember from 2 weeks ago. Once more is he has significant levels of hypergranulation. Noteworthy that he had this same hyper granulated response on his wound on the left foot at one point in time. So much so that he I thought there was an underlying fluid collection. Based on this I think this just needs debridement. 5/9; the wound on the left actually continues to be gradually smaller with a healthy surface. Slight amount of drainage and maceration of the skin around but not too bad. However he has a large wound over the right fifth metatarsal head very much in the same configuration as his left foot wound was initially. I used silver nitrate to address the hyper granulated tissue no mechanical debridement 5/16; area on the left foot did not look as healthy this week deeper thick surrounding macerated skin and subcutaneous tissue. oo The area on the right foot fifth met head was about the same oo The area on the right ankle that we identified last week is completely broken down into an open wound presumably a stocking rubbing issue 5/23; patient has been using a total contact cast to the left side. He has been using silver alginate underneath. He has also been using silver alginate to the right foot wounds. He has no complaints today. He denies any signs of infection. 5/31; the left-sided wound looks some better measure smaller surface granulation looks better. We have been using silver alginate under the total contact cast oo The large area on his right fifth met head and right dorsal foot look about the same still using silver alginate 6/6; neither side is good as I was hoping although the surface area dimensions are better. A lot of maceration on his left and right foot around the wound edge. Area on the dorsal right foot looks better. He says he was traveling. I am not sure  what does the amount of maceration around the plantar wounds may be drainage issues 6/13; in general the wound surfaces look quite good on both sides. Macerated skin and raised edges around the wound required debridement although in general especially on the left the surface area seems improved. oo The area on the right dorsal ankle is about the same I thought this would not be such a problem to close 6/20; not much change in either wound although the one on the right looks a little better. Both wounds have thick macerated edges to the skin requiring debridements. We have been using silver alginate. The area on the dorsal right ankle is still open I thought this would be closed. 6/28; patient comes in today with a marked deterioration in the right foot wound fifth met head. Wide area of exposed bone this is a drastic change from last time. The area on the left there we have been casting is stagnant. We have been using silver alginate in both wound areas. 7/5; bone culture I did for PCR last time was positive for Pseudomonas, group B strep, Enterococcus and Staph aureus. There was no suggestion of methicillin resistance or ampicillin resistant genes. This was resistant to tetracycline however He comes into the clinic today with the area over his right plantar fifth metatarsal head which had been doing so well 2 weeks ago completely necrotic feeling bone. I  do not know that this is going to be salvageable. The left foot wound is certainly no smaller but it has a better surface and is superficial. 7/8; patient called in this morning to say that his total contact cast was rubbing against his foot. He states he is doing fine overall. He denies signs of infection. 7/12; continued deterioration in the wound over the right fifth metatarsal head crumbling bone. This is not going to be salvageable. The patient agrees and wants to be referred to Dr. Doran Durand which we will attempt to arrange as soon as possible.  I am going to continue him on antibiotics as long as that takes so I will renew those today. The area on the left foot which is the base of the fifth metatarsal continues to look somewhat better. Healthy looking tissue no depth no debridement is necessary here. 7/20; the patient was kindly seen by Dr. Doran Durand of orthopedics on 10/19/2020. 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 Towards this end we have taken him out of the total contact cast on the left we will put him in a wrap with Hydrofera Blue. As I understand things surgery is planned for 7/21 7/27; patient had his surgery last Thursday. 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 Apparently everything went well we did not still disturb that today The area on the left foot actually looks quite good. He has been much less mobile which probably explains this he did not seem to do well in the total contact cast secondary to drainage and maceration I think. 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 Fortunately there is no evidence of active infection at this time. No fevers, chills, nausea, vomiting, or diarrhea. Overall I think that he is actually doing extremely well. Nonetheless I do believe that he is staying off of this more following the surgery in his right foot that is the reason the left is doing so great. 8/16; left plantar foot wound. This looks smaller than the last time I saw this he is using Hydrofera Blue. The surgical wound on the right foot is being followed by Dr. Doran Durand we did not look at this today. He has surgical shoes on both feet 8/23; left plantar foot wound not as good this week. Surrounding macerated skin and subcutaneous tissue everything looks moist and wet. I do not think he is offloading this adequately. He is using a  surgical shoe Apparently the right foot surgical wound is not open although I did not check his foot 8/31; left plantar foot lateral aspect. Much improved this week. He has no maceration. Some improvement in the surface area of the wound but most impressively the depth is come in we are using silver alginate. The patient is a Product/process development scientist. He is asked that we write him a letter so he can go back to work. I have also tried to see if we can write something that will allow him to limit the amount of time that he is on his foot at work. 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 Hopefully the latter can be avoided 9/6; I believe the patient missed an appointment last week. He  arrives in today with a wound looking roughly the same certainly no better. Undermining laterally and also inferiorly. We used molecuLight today in training with the patient's permission.. We are using silver alginate 9/21 wound is measuring bigger this week although this may have to do with the aggressive circumferential debridement last week in response to the blush fluorescence on the MolecuLight. Culture I did last week showed significant MSSA and E. coli. I put him on Augmentin but he has not started it yet. We are also going to send this for compounded antibiotics at [facility]. There is no evidence of systemic infection 9/29; silver alginate. His Keystone arrived. He is completing Augmentin in 2 days. Offloading in a cam boot. Moderate drainage per our intake staff 10/5; using silver alginate. He has been using his [place]. He has completed his Augmentin. Per our intake nurse still a lot of drainage, far too much to consider a total contact cast. Wound measures about the same. He had the same undermining area that I defined last week from a roughly 11-3. I remove this today 10/12; using silver alginate he is using the [place]. He comes  in for a nurse visit hence we are applying [employer] twice a week. Measuring slightly better today and less notable drainage. Extensive debridement of the wound edge last time 10/18; using topical Keystone and silver alginate and a soft cast. Wound measurements about the same. Drainage was through his soft cast. We are changing this twice a week Tuesdays and Friday 10/25; comes in with moderate drainage. Still using Keystone silver alginate and a soft cast. Wound dimensions completely the same.He has a lot of edema in the left leg he has lymphedema. Asking for [name] to consider wrapping him as he cannot get his stocking on over the soft cast 11/2; comes in with moderate to large drainage slightly smaller in terms of width we have been using [place]. His wound looks satisfactory but not much improvement 11/4; patient presents today for obligatory cast change. Has no issues or complaints today. He denies signs of infection. 11/9; patient traveled this weekend to [place], was on the cast quite a bit. Staining of the cast with black material from his walking boot. Drainage was not quite as bad as we feared. Using silver alginate and Keystone 11/16; we do not have size for cast therefore we have been putting a soft cast on him since the change on Friday. Still a significant amount of drainage necessitating changing twice a week. We have been using the Keystone at cast changes either hard or soft as well as silver alginate Comes in the clinic with things actually looking fairly good improvement in width. He says his offloading is about the same 02/24/2021 upon evaluation today patient actually comes back in and is doing excellent in regard to his foot ulcer this is significantly smaller even compared to the last visit. The soft cast seems to have done extremely well for him which is great news. I do not see any signs of infection minimal debridement will be needed today. 11/30; left lateral foot much improved  half a centimeter improvement in surface area. No evidence of infection. He seems to be doing better with the soft cast in the TCC therefore we will continue with this. He comes back in later in the week for a change with the nurses. This is due to drainage 12/6; no improvement in dimensions. Under illumination some debris on the surface we have been using silver alginate, soft cast.  If there is anything optimistic here he seems to have have less drainage 12/13. Dimensions are improved both length and width and slightly in depth. Appears to be quite healthy today. Raised edges of this thick skin and callus around the edges however. He is in a soft cast were bringing him back once for a change on Friday. Drainage is better 12/20. Dimensions are improved. He still has raised edges of thick skin and callus around the edges. We are using a soft cast 12/28; comes in today with thick callus around the wound. Using silver under alginate under a soft cast. I do not think there is much improvement in any measurement 2023 04/06/2021; patient was put in a total contact cast. Unfortunately not much change in surface area 1/10; not much different still thick callus and skin around the edge in spite of the total contact cast. This was just debrided last week we have been using the [facility] compounded antibiotic and silver alginate under a total contact cast 1/18 the patient's wound on the left side is doing nicely. smaller HOWEVER he comes in today with a wound on the right foot laterally. blister most likely serosangquenous drainage 1/24; the patient continues to do well in terms of the plantar left foot which is continued to contract using silver alginate under the total contact cast HOWEVER the right lateral foot is bigger with denuded skin around the edges. I used pickups and a #15 scalpel to remove this this looks like the remanence of a large blister. Cannot rule out infection. Culture in this area I did last  week showed Staphylococcus lugdunensis few colonies. I am going to try to address this with his [employer] antibiotic that is done so well on the left having linezolid and this should cover the staph 2/1; the patient's wound on his left foot which was the original plantar foot wound thick skin and eschar around the edges even in the total contact cast but the wound surface does not look too bad The real problem is on how his right lateral foot at roughly the base of the fifth metatarsal. The wound is completely necrotic more worrisome than that there is swelling around the edges of this. We have been using silver alginate on both wounds and Keystone on the right foot. Unfortunately I think he is going to require systemic antibiotics while we await cultures. He did not get the x-ray done that we ordered last week [lost the prescription 2/7; disappointingly in the area on the left foot which we are treating with a total contact cast is still not closed although it is much smaller. He continues to have a lot of callus around the wound edge. -Right lateral foot culture I did last week was negative x-ray also negative for osteomyelitis. 2/15: TCC silver alginate on the left and silver alginate on the right lateral. No real improvement in either area 05/26/2021: T oday, the wounds are roughly the same size as at his previous visit, post-debridement. He continues to endorse fairly substantial drainage, particularly on the right. He has been in a total contact cast on the left. There is still some callus surrounding this lesion. On the right, the periwound skin is quite macerated, along with surrounding callus. The center of the right-sided wound also has some dark, densely adherent material, which is very difficult to remove. 06/02/2021: Today, both wounds are slightly smaller. He has been using zinc oxide ointment around the right ulcer and the degree of maceration has improved  markedly. There continues to be  an area of nonviable tissue in the center of the right sided ulcer. The left-sided wound, which has been in the total contact cast. Appears clean and the degree of callus around it is less than previously. 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 He had to take the stairs and both wounds have increased in size. The left foot, which has been in a total contact cast, has developed a tunnel tracking to the lateral aspect of his foot. The nonviable tissue in the center of the right-sided ulcer remains recalcitrant to debridement. There is significant undermining surrounding the entirety of the left sided wound. 06/16/2021: The elevator at school has been fixed and the patient has been able to avoid putting as much weight on his wounds over the past week. We converted the left foot wound into a single lesion today, but despite this, the wound is actually smaller. The base is healthy with limited periwound callus. On the right, the central necrotic area is still present. He continues to be quite macerated around the right sided wound, despite applying barrier cream. This does, however, have the benefit of softening the callus to make it more easily removable. 06/23/2021: Today, the left wound is smaller. The lateral aspect that had opened up previously is now closed. The wound base has a healthy bed of granulation tissue and minimal slough. Unfortunately, on the right, the wound is larger and continues to be fairly macerated. He has also reopened the wound at his right ankle. He thinks this is due to the gait he has adopted secondary to his total contact cast and boot. 06/30/2021: T oday, both wounds are a little bit larger. The lateral aspect on the left has remained closed. He continues to have significant periwound maceration. The culture that I took from the right sided wound grew a population of bacteria that is not covered by his current  [facility] antibiotic. The center of the right- sided wound continues to appear necrotic with nonviable fat. It probes deeper today, but does not reach bone. 07/07/2021: The periwound maceration is a little bit less today. The right lateral foot wound has some areas that appear more viable and the necrotic center also looks a little bit better. The wound on the dorsal surface of his right foot near the ankle is contracting and the surface appears healthy. The left plantar wound surface looks healthy, but there is some new undermining on the medial portion. He did get his new Keystone antibiotic and began applying that to the right foot wound on Saturday. 07/14/2021: The intake nurse reported substantial drainage from his wounds, but the periwound skin actually looks better than is typical for him. The wound on the dorsal surface of his right foot near the ankle is smaller and just has a small open area underneath some dried eschar. The left plantar wound surface looks healthy and there has been no significant accumulation of callus. The right lateral foot wound looks quite a bit better, with the central portion, which typically appears necrotic, looking more viable albeit pale. 07/22/2021: His left foot is extremely macerated today. The wound is about the same size. The wound on the dorsal surface of his right foot near the ankle had closed, but he traumatized it removing the dressing and there is a tiny skin tear in that location. The right lateral foot wound is bigger, but the surface appears healthy. 07/30/2021: The  wound on the dorsal surface of his right foot near the ankle is closed. The right lateral foot wound again is a little bit bigger due to some undermining. The periwound skin is in better condition, however. He has been applying zinc oxide. The wound surface is a little bit dry today. On the left, he does not have the substantial maceration that we frequently see. The wound itself is smaller  and has a clean surface. 08/06/2021: Both wounds seem to have deteriorated over the past week. The right lateral foot wound has a dry surface but the periwound is boggy.. Overall wound dimensions are about the same. On the left, the wound is about the same size, but there is more undermining present underneath periwound callus. 08/13/2021: The right sided wound looks about the same, but on the left there has been substantial deterioration. The undermining continues to extend under periwound callus. Once this was removed, substantial extension of the wound was present. There is no odor or purulent drainage but clearly the wounds have broken down. 08/20/2021: The wounds look about the same today. He has been out of his total contact cast and has just been changing the dressings using topical Keystone with PolyMem Ag, Kerlix and Ace bandages. The wound on the top of his right ankle has reopened but this is quite small. There was a little bit of purulent material that I expressed when examining this wound. 08/24/2021: After the aggressive debridement I performed at his last visit, the wounds actually look a little bit better today. They are smaller with the exception of the wound on the top of his right ankle which is a little bit bigger as some more skin pulled off when he was changing his dressing. We are using topical Keystone with PolyMem Ag Kerlix and Ace bandages. 09/02/2021: There has been really no change to any of his wounds. 09/16/2021: The patient was hospitalized last week with nausea, vomiting, and dehydration. He says that while he was in the hospital, his wounds were not really addressed properly. T oday, both plantar foot wounds are larger and the periwound skin is macerated. The wound on the dorsum of his right foot has a scab on the top. The right foot now has a crater where previously he had had nonviable fat. It looks as though this simply died and fell out. The periwound callus is  wet. 09/24/2021: His wounds have deteriorated somewhat since his last visit. The wound on the dorsum of his right foot near his ankle is larger and has more nonviable tissue present. The crater in his right foot is even deeper; I cannot quite palpate or probe to bone but I am sure it is close. The wound on his left plantar foot has an odd boggy area in the center that almost feels as though it has fluid within it. He has run out of his topical Keystone antibiotic. We are using silver alginate on his wounds. 09/29/2021: He has developed a new wound on the dorsum of his left foot near his ankle. He says he thinks his wrapping is rubbing in that site. I would concur with this as the wound on his right ankle is larger. The left foot looks about the same. The right foot has the crater that was present last week. No significant slough accumulation, but his foot remains quite swollen and warm despite oral antibiotic therapy. 10/08/2021: All of his wounds look about the same as last week. He did not start his oral antibiotics  that are prescribed until just a couple of days ago; his [employer] compounded antibiotics formula has been changed and he is awaiting delivery of the new recipe. His MRI that was scheduled for earlier this week was canceled as no prior authorization had been obtained; unfortunately the tech responsible sent an email to my old [employer] email, which I no longer use nor have access to. 10/18/2021: The wounds on his bilateral dorsal feet near the ankles are both improved. They are smaller and have just some eschar and slough buildup. The left plantar wound has a fair amount of undermining, but the surface is clean. There is some periwound callus accumulation. On the right plantar foot, there is nonviable fat leading to a deep tunnel that tracks towards his dorsal medial foot. There is periwound callus and slough accumulation, as well. His right foot and leg remain swollen as compared to the  left. Patient History Information obtained from Patient. Family History No family history of Cancer, Diabetes, Hereditary Spherocytosis, Hypertension, Kidney Disease, Lung Disease, Seizures, Stroke, Thyroid Problems, Tuberculosis. Social History Never smoker, Marital Status - Single, Alcohol Use - Rarely, Drug Use - No History, Caffeine Use - Never. Medical History Eyes Denies history of Cataracts, Glaucoma, Optic Neuritis Ear/Nose/Mouth/Throat Denies history of Chronic sinus problems/congestion, Middle ear problems Hematologic/Lymphatic Denies history of Anemia, Hemophilia, Human Immunodeficiency Virus, Lymphedema, Sickle Cell Disease Respiratory Denies history of Aspiration, Asthma, Chronic Obstructive Pulmonary Disease (COPD), Pneumothorax, Sleep Apnea, Tuberculosis Cardiovascular Denies history of Angina, Arrhythmia, Congestive Heart Failure, Coronary Artery Disease, Deep Vein Thrombosis, Hypertension, Hypotension, Myocardial Infarction, Peripheral Arterial Disease, Peripheral Venous Disease, Phlebitis, Vasculitis Gastrointestinal Denies history of Cirrhosis , Colitis, Crohnoos, Hepatitis A, Hepatitis B, Hepatitis C Endocrine Patient has history of Type II Diabetes Denies history of Type I Diabetes Immunological Denies history of Lupus Erythematosus, Raynaudoos, Scleroderma Integumentary (Skin) Denies history of History of Burn Musculoskeletal Denies history of Gout, Rheumatoid Arthritis, Osteoarthritis, Osteomyelitis Neurologic Denies history of Dementia, Neuropathy, Quadriplegia, Paraplegia, Seizure Disorder Oncologic Denies history of Received Chemotherapy, Received Radiation Psychiatric Denies history of Anorexia/bulimia, Confinement Anxiety Hospitalization/Surgery History - 11/1-11/06/2018- sepsis foot infection. - 11/4-11/5 02 sats low respiratory distress. Objective Constitutional No acute distress.. Vitals Time Taken: 1:00 PM, Height: 77 in, Weight: 280 lbs, BMI:  33.2, Temperature: 98.6 F, Pulse: 98 bpm, Respiratory Rate: 16 breaths/min, Blood Pressure: 132/82 mmHg. Respiratory Normal work of breathing on room air.. General Notes: 10/18/2021: The wounds on his bilateral dorsal feet near the ankles are both improved. They are smaller and have just some eschar and slough buildup. The left plantar wound has a fair amount of undermining, but the surface is clean. There is some periwound callus accumulation. On the right plantar foot, there is nonviable fat leading to a deep tunnel that tracks towards his dorsal medial foot. There is periwound callus and slough accumulation, as well. His right foot and leg remain swollen as compared to the left. Integumentary (Hair, Skin) Wound #11 status is Open. Original cause of wound was Gradually Appeared. The date acquired was: 08/20/2021. The wound has been in treatment 8 weeks. The wound is located on the [place]. The wound measures 1.2cm length x 1.9cm width x 0.1cm depth; 1.791cm^2 area and 0.179cm^3 volume. There is Fat Layer (Subcutaneous Tissue) exposed. There is no tunneling or undermining noted. There is a medium amount of serosanguineous drainage noted. The wound margin is distinct with the outline attached to the wound base. There is large (67-100%) red, pink granulation within the wound bed.  There is a small (1- 33%) amount of necrotic tissue within the wound bed including Adherent Slough. Wound #12 status is Open. Original cause of wound was Gradually Appeared. The date acquired was: 09/29/2021. The wound has been in treatment 2 weeks. The wound is located on the Left,Dorsal Foot. The wound measures 0.5cm length x 0.7cm width x 0.1cm depth; 0.275cm^2 area and 0.027cm^3 volume. There is no tunneling or undermining noted. There is a medium amount of serosanguineous drainage noted. The wound margin is distinct with the outline attached to the wound base. There is medium (34-66%) red granulation within  the wound bed. There is a medium (34-66%) amount of necrotic tissue within the wound bed including Adherent Slough. Wound #3 status is Open. Original cause of wound was Trauma. The date acquired was: 10/02/2019. The wound has been in treatment 103 weeks. The wound is located on the [place]. The wound measures 3.2cm length x 3.8cm width x 0.2cm depth; 9.55cm^2 area and 1.91cm^3 volume. There is Fat Layer (Subcutaneous Tissue) exposed. There is no undermining noted, however, there is tunneling at 9:00 with a maximum distance of 1.3cm. There is a medium amount of serosanguineous drainage noted. The wound margin is well defined and not attached to the wound base. There is large (67-100%) pink granulation within the wound bed. There is a small (1-33%) amount of necrotic tissue within the wound bed including Adherent Slough. Wound #8 status is Open. Original cause of wound was Blister. The date acquired was: 04/18/2021. The wound has been in treatment 25 weeks. The wound is located on the Right,Lateral Foot. The wound measures 4.2cm length x 2.7cm width x 0.2cm depth; 8.906cm^2 area and 1.781cm^3 volume. There is Fat Layer (Subcutaneous Tissue) exposed. There is no undermining noted, however, there is tunneling at 3:00 with a maximum distance of 1.8cm. There is a large amount of serosanguineous drainage noted. The wound margin is well defined and not attached to the wound base. There is large (67-100%) red, pink granulation within the wound bed. There is a small (1-33%) amount of necrotic tissue within the wound bed including Adherent Slough. 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 There was a Excisional Skin/Subcutaneous Tissue Debridement with a total area of 2.28 sq cm performed by [name], MD. With the following instrument(s): Curette to remove Non-Viable tissue/material. Material removed includes Eschar, Subcutaneous Tissue, and Slough. No specimens were taken. A time out was conducted at 13:14, prior to the start of the procedure. A Minimum amount of bleeding was controlled with Pressure. The procedure was tolerated well with a pain level of 0 throughout and a pain level of 0 following the procedure. Post Debridement Measurements: 1.2cm length x 1.9cm width x 0.1cm depth; 0.179cm^3 volume. Character of Wound/Ulcer Post Debridement is improved. Severity of Tissue Post Debridement is: Fat layer exposed. Post procedure Diagnosis Wound #11: Same as Pre-Procedure Wound #12 Pre-procedure diagnosis of Wound #12 is a Diabetic Wound/Ulcer of the Lower Extremity located on the Left,Dorsal Foot .Severity of Tissue Pre Debridement is: Fat  layer exposed. There was a Excisional Skin/Subcutaneous Tissue Debridement with a total area of 0.35 sq cm performed by [name], MD. With the following instrument(s): Curette to remove Non-Viable tissue/material. Material removed includes Eschar, Subcutaneous Tissue, and Slough. No specimens were taken. A time out was conducted at 13:14, prior to the start of the procedure. A Minimum amount of bleeding was controlled with Pressure. The procedure was tolerated well with a pain level of 0 throughout and a pain level of 0 following the procedure. Post Debridement Measurements: 0.5cm length x 0.7cm width x 0.1cm depth; 0.027cm^3 volume. Character of Wound/Ulcer Post Debridement is improved. Severity of  Tissue Post Debridement is: Fat layer exposed. Post procedure Diagnosis Wound #12: Same as Pre-Procedure Wound #3 Pre-procedure diagnosis of Wound #3 is a Diabetic Wound/Ulcer of the Lower Extremity located on the Left,Lateral,Plantar Foot .Severity of Tissue Pre Debridement is: Fat layer exposed. There was a Excisional Skin/Subcutaneous Tissue Debridement with a total area of 12.16 sq cm performed by [name], MD. With the following instrument(s): Curette to remove Non-Viable tissue/material. Material removed includes Eschar, Callus, Subcutaneous Tissue, Slough, Skin: Dermis, and Skin: Epidermis. No specimens were taken. A time out was conducted at 13:14, prior to the start of the procedure. A Minimum amount of bleeding was controlled with Pressure. The procedure was tolerated well with a pain level of 0 throughout and a pain level of 0 following the procedure. Post Debridement Measurements: 3.2cm length x 3.8cm width x 0.2cm depth; 1.91cm^3 volume. Character of Wound/Ulcer Post Debridement is improved. Severity of Tissue Post Debridement is: Fat layer exposed. 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 There was a Excisional Skin/Subcutaneous Tissue Debridement with a total area of 11.34 sq cm performed by [name], MD. With the following instrument(s): Curette to remove Non-Viable tissue/material. Material removed includes Fat, Eschar, Callus, Subcutaneous Tissue, and Slough. No specimens were taken. A time out was conducted at 13:14, prior to the start of the procedure. A Minimum amount of bleeding was controlled with Pressure. The procedure was tolerated well with a pain level of 0 throughout and a pain level of 0 following the procedure. Post Debridement Measurements: 4.2cm length  x 2.7cm width x 0.2cm depth; 1.781cm^3 volume. Character of Wound/Ulcer Post Debridement is improved. Severity of Tissue Post Debridement is: Fat layer exposed. Post procedure Diagnosis Wound #8: Same as Pre-Procedure Plan Follow-up Appointments: Return Appointment in 1 week. - Dr. [name] - Room 1 with [facility] Monday 10/25/21 @ 1530 Bathing/ Shower/ Hygiene: May shower with protection but do not get wound dressing(s) wet. - Use Cast Protector Bag on Right and left legs Edema Control - Lymphedema / SCD / Other: Elevate legs to the level of the heart or above for 30 minutes daily and/or when sitting, a frequency of: - throughout the day Avoid standing for long periods of time. Exercise regularly Compression stocking or Garment 20-30 mm/Hg pressure to: - to both legs daily Off-Loading: Open toe surgical shoe to: - right foot Additional Orders / Instructions: Follow Nutritious Diet Services and Therapies ordered were: Venous Duplex Doppler - Stat, Right leg swelling, R/O DVT . WOUND #11: - Foot Wound Laterality: Right, Anterior Cleanser: Soap and Water 1 x Per Day/30 Days Discharge Instructions: May shower and wash wound with dial antibacterial soap and water prior to dressing change. Cleanser:  Byram Ancillary Kit - 15 Day Supply (Generic) 1 x Per Day/30 Days Discharge Instructions: Use supplies as instructed; Kit contains: (15) Saline Bullets; (15) 3x3 Gauze; 15 pr Gloves Topical: keystone antibiotic compound 1 x Per Day/30 Days Prim Dressing: KerraCel Ag Gelling Fiber Dressing, 4x5 in (silver alginate) (Dispense As Written) 1 x Per Day/30 Days ary Discharge Instructions: Apply silver alginate to wound bed as instructed Secondary Dressing: ABD Pad, 8x10 (Generic) 1 x Per Day/30 Days Discharge Instructions: Apply over primary dressing as directed. Secondary Dressing: Optifoam Non-Adhesive Dressing, 4x4 in (Generic) 1 x Per Day/30 Days Discharge Instructions: Apply over primary dressing as  directed. Secondary Dressing: Zetuvit Plus 4x8 in (Dispense As Written) 1 x Per Day/30 Days Discharge Instructions: Apply over primary dressing as directed. Secondary Dressing: NonWoven Sponge, 4x4 (in/in) (Generic) 1 x Per Day/30 Days Secured With: Coban Self-Adherent Wrap 4x5 (in/yd) (Generic) 1 x Per Day/30 Days Discharge Instructions: Secure with Coban as directed. Secured With: [employer], 4.5x3.1 (in/yd) (Generic) 1 x Per Day/30 Days Discharge Instructions: Secure with Kerlix as directed. Secured With: 74M Medipore H Soft Cloth Surgical T ape, 4 x 10 (in/yd) (Generic) 1 x Per Day/30 Days Discharge Instructions: Secure with tape as directed. WOUND #12: - Foot Wound Laterality: Dorsal, Left Cleanser: Soap and Water 1 x Per Day/30 Days Discharge Instructions: May shower and wash wound with dial antibacterial soap and water prior to dressing change. Cleanser: Byram Ancillary Kit - 15 Day Supply (Generic) 1 x Per Day/30 Days Discharge Instructions: Use supplies as instructed; Kit contains: (15) Saline Bullets; (15) 3x3 Gauze; 15 pr Gloves Topical: keystone antibiotic compound 1 x Per Day/30 Days Prim Dressing: KerraCel Ag Gelling Fiber Dressing, 4x5 in (silver alginate) (Dispense As Written) 1 x Per Day/30 Days ary Discharge Instructions: Apply silver alginate to wound bed as instructed Secondary Dressing: ABD Pad, 8x10 (Generic) 1 x Per Day/30 Days Discharge Instructions: Apply over primary dressing as directed. Secondary Dressing: NonWoven Sponge, 4x4 (in/in) (Generic) 1 x Per Day/30 Days Secured With: Coban Self-Adherent Wrap 4x5 (in/yd) (Generic) 1 x Per Day/30 Days Discharge Instructions: Secure with Coban as directed. Secured With: [employer], 4.5x3.1 (in/yd) (Generic) 1 x Per Day/30 Days Discharge Instructions: Secure with Kerlix as directed. Secured With: 74M Medipore H Soft Cloth Surgical T ape, 4 x 10 (in/yd) (Generic) 1 x Per Day/30 Days Discharge Instructions:  Secure with tape as directed. WOUND #3: - Foot Wound Laterality: Plantar, Left, Lateral Cleanser: Soap and Water 1 x Per Day/30 Days Discharge Instructions: May shower and wash wound with dial antibacterial soap and water prior to dressing change. Cleanser: Byram Ancillary Kit - 15 Day Supply (Generic) 1 x Per Day/30 Days Discharge Instructions: Use supplies as instructed; Kit contains: (15) Saline Bullets; (15) 3x3 Gauze; 15 pr Gloves Topical: keystone antibiotic compound 1 x Per Day/30 Days Prim Dressing: KerraCel Ag Gelling Fiber Dressing, 4x5 in (silver alginate) (Dispense As Written) 1 x Per Day/30 Days ary Discharge Instructions: Apply silver alginate to wound bed as instructed Secondary Dressing: ABD Pad, 8x10 (Generic) 1 x Per Day/30 Days Discharge Instructions: Apply over primary dressing as directed. Secondary Dressing: Optifoam Non-Adhesive Dressing, 4x4 in (Generic) 1 x Per Day/30 Days Discharge Instructions: Apply over primary dressing as directed. Secondary Dressing: Zetuvit Plus 4x8 in (Generic) 1 x Per Day/30 Days Discharge Instructions: Apply over primary dressing as directed. Secondary Dressing: NonWoven Sponge, 4x4 (in/in) (Generic) 1 x Per Day/30 Days Secured With: Coban Self-Adherent Wrap 4x5 (in/yd) (Generic) 1  x Per Day/30 Days Discharge Instructions: Secure with Coban as directed. Secured With: [employer], 4.5x3.1 (in/yd) (Generic) 1 x Per Day/30 Days Discharge Instructions: Secure with Kerlix as directed. Secured With: 61M Medipore H Soft Cloth Surgical T ape, 4 x 10 (in/yd) (Generic) 1 x Per Day/30 Days Discharge Instructions: Secure with tape as directed. WOUND #8: - Foot Wound Laterality: Right, Lateral Cleanser: Soap and Water 1 x Per Day/30 Days Discharge Instructions: May shower and wash wound with dial antibacterial soap and water prior to dressing change. Cleanser: Byram Ancillary Kit - 15 Day Supply (Generic) 1 x Per Day/30 Days Discharge  Instructions: Use supplies as instructed; Kit contains: (15) Saline Bullets; (15) 3x3 Gauze; 15 pr Gloves Topical: keystone antibiotic compound 1 x Per Day/30 Days Prim Dressing: KerraCel Ag Gelling Fiber Dressing, 4x5 in (silver alginate) (Dispense As Written) 1 x Per Day/30 Days ary Discharge Instructions: Apply silver alginate to wound bed as instructed Secondary Dressing: ABD Pad, 8x10 (Generic) 1 x Per Day/30 Days Discharge Instructions: Apply over primary dressing as directed. Secondary Dressing: NonWoven Sponge, 4x4 (in/in) (Generic) 1 x Per Day/30 Days Secured With: Coban Self-Adherent Wrap 4x5 (in/yd) (Generic) 1 x Per Day/30 Days Discharge Instructions: Secure with Coban as directed. Secured With: [employer], 4.5x3.1 (in/yd) (Generic) 1 x Per Day/30 Days Discharge Instructions: Secure with Kerlix as directed. Secured With: 61M Medipore H Soft Cloth Surgical T ape, 4 x 10 (in/yd) (Generic) 1 x Per Day/30 Days Discharge Instructions: Secure with tape as directed. 10/18/2021: The wounds on his bilateral dorsal feet near the ankles are both improved. They are smaller and have just some eschar and slough buildup. The left plantar wound has a fair amount of undermining, but the surface is clean. There is some periwound callus accumulation. On the right plantar foot, there is nonviable fat leading to a deep tunnel that tracks towards his dorsal medial foot. There is periwound callus and slough accumulation, as well. His right foot and leg remain swollen as compared to the left. Used a curette to debride slough and eschar from both of the dorsal wounds. I debrided callus, skin, and biofilm from the left plantar wound, trying to minimize the undermining. I debrided slough, callus, and skin from the right plantar foot wound. We will continue using the topical Keystone antibiotic with silver alginate. I will work on getting the peer to peer consultation for his MRI. We have not ruled out  DVT and so we are going to send him for a DVT scan to rule that out as a potential contributing source for the ongoing swelling in his right lower extremity. He will follow-up here in 1 week. [facility] DX: 1st Scanned Wound Fluorescence bacterial imaging was medically necessary today due to Wound previously healing as expected but has recently stalled (flattening of (Indication): the area/volume) Electronic Signature(s) Signed: 10/18/2021 1:37:05 PM By: [name] MD FACS Entered By: [name] on 10/18/2021 13:37:05 -------------------------------------------------------------------------------- HxROS Details Patient Name: Date of Service: [name], [name] 10/18/2021 12:45 PM Medical Record Number: [id number] Patient Account Number: [id number] Date of Birth/Sex: Treating RN: 12-30-1985 (36 y.o. [name] Primary Care Provider: [name] Other Clinician: Referring Provider: Treating Provider/Extender: [name] in Treatment: 31 Information Obtained From Patient Eyes Medical History: Negative for: Cataracts; Glaucoma; Optic Neuritis Ear/Nose/Mouth/Throat Medical History: Negative for: Chronic sinus problems/congestion; Middle ear problems Hematologic/Lymphatic Medical History: Negative for: Anemia; Hemophilia; Human Immunodeficiency Virus; Lymphedema; Sickle Cell Disease Respiratory Medical History:  Negative for: Aspiration; Asthma; Chronic Obstructive Pulmonary Disease (COPD); Pneumothorax; Sleep Apnea; Tuberculosis Cardiovascular Medical History: Negative for: Angina; Arrhythmia; Congestive Heart Failure; Coronary Artery Disease; Deep Vein Thrombosis; Hypertension; Hypotension; Myocardial Infarction; Peripheral Arterial Disease; Peripheral Venous Disease; Phlebitis; Vasculitis Gastrointestinal Medical History: Negative for: Cirrhosis ; Colitis; Crohns; Hepatitis A; Hepatitis B; Hepatitis C Endocrine Medical  History: Positive for: Type II Diabetes Negative for: Type I Diabetes Time with diabetes: 8 Treated with: Insulin Blood sugar tested every day: No Immunological Medical History: Negative for: Lupus Erythematosus; Raynauds; Scleroderma Integumentary (Skin) Medical History: Negative for: History of Burn Musculoskeletal Medical History: Negative for: Gout; Rheumatoid Arthritis; Osteoarthritis; Osteomyelitis Neurologic Medical History: Negative for: Dementia; Neuropathy; Quadriplegia; Paraplegia; Seizure Disorder Oncologic Medical History: Negative for: Received Chemotherapy; Received Radiation Psychiatric Medical History: Negative for: Anorexia/bulimia; Confinement Anxiety Immunizations Pneumococcal Vaccine: Received Pneumococcal Vaccination: No Implantable Devices [name] Hospitalization / Surgery History Type of Hospitalization/Surgery 11/1-11/06/2018- sepsis foot infection 11/4-11/5 02 sats low respiratory distress Family and Social History Cancer: No; Diabetes: No; Hereditary Spherocytosis: No; Hypertension: No; Kidney Disease: No; Lung Disease: No; Seizures: No; Stroke: No; Thyroid Problems: No; Tuberculosis: No; Never smoker; Marital Status - Single; Alcohol Use: Rarely; Drug Use: No History; Caffeine Use: Never; Financial Concerns: No; Food, Clothing or Shelter Needs: No; Support System Lacking: No; Transportation Concerns: No Electronic Signature(s) Signed: 10/18/2021 3:35:15 PM By: [name] MD FACS Signed: 10/19/2021 5:25:46 PM By: Adline Peals Entered By: [name] on 10/18/2021 13:34:11 -------------------------------------------------------------------------------- SuperBill Details Patient Name: Date of Service: [name], [name] 10/18/2021 Medical Record Number: [id number] Patient Account Number: [id number] Date of Birth/Sex: Treating RN: 08/16/1985 (36 y.o. 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 Facility Procedures CPT4 Code: [id number] Description: 11042 - DEB SUBQ TISSUE 20 SQ CM/< ICD-10 Diagnosis Description L97.528 Non-pressure chronic ulcer of other part of left foot with other specified seve L97.518 Non-pressure chronic ulcer of other part of right foot with other specified sev  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 Modifier: rity erity Quantity: 1 CPT4 Code: [id number] 1 I Description: 4709 - DEB SUBQ TISS EA ADDL 20CM CD-10 Diagnosis Description L97.528 Non-pressure chronic ulcer of other part of left foot with other specified severity L97.518 Non-pressure chronic ulcer of other part of right foot with other specified  severit 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 Modifier: y Quantity: 1 Physician Procedures : CPT4 Code Description Modifier [id number] - WC PHYS LEVEL 4 - EST PT 25 ICD-10 Diagnosis Description L97.528 Non-pressure chronic ulcer of other part of left foot with other specified severity L97.518 Non-pressure chronic ulcer of other part of  right foot with other specified severity 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 Quantity: 1 : [id number] 11042 - WC PHYS  SUBQ TISS 20 SQ CM ICD-10 Diagnosis Description L97.528 Non-pressure chronic ulcer of other part of left  foot with other specified severity L97.518 Non-pressure chronic ulcer of other part of right foot with other specified  severity 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 Quantity: 1 : [id number] 91368 - WC PHYS SUBQ TISS EA ADDL 20 CM ICD-10 Diagnosis Description L97.528 Non-pressure chronic ulcer of other part of left foot with other specified severity L97.518 Non-pressure chronic ulcer of other part of right foot with other specified  severity 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

## 2021-10-19 NOTE — Progress Notes (Signed)
[name], [name] ([id number]) Visit Report for 10/18/2021 Arrival Information Details Patient Name: Date of Service: [name] 10/18/2021 12:45 PM Medical Record Number: [id number] Patient Account Number: [id number] Date of Birth/Sex: Treating RN: 09-20-1985 (36 y.o. [name] Primary Care Natilee Gauer: [name] Other Clinician: Referring Daniyah Fohl: Treating Tiyah Zelenak/Extender: [name] in Treatment: 56 Visit Information History Since Last Visit Added or deleted any medications: No Patient Arrived: Ambulatory Any new allergies or adverse reactions: No Arrival Time: 12:49 Had a fall or experienced change in No Accompanied By: Self activities of daily living that may affect Transfer Assistance: None risk of falls: Patient Identification Verified: Yes Signs or symptoms of abuse/neglect since last visito No Secondary Verification Process Completed: Yes Hospitalized since last visit: No Patient Requires Transmission-Based Precautions: No Implantable device outside of the clinic excluding No Patient Has Alerts: No cellular tissue based products placed in the center since last visit: Has Dressing in Place as Prescribed: Yes Pain Present Now: No Electronic Signature(s) Signed: 10/18/2021 5:26:27 PM By: [name] RN, BSN Entered By: [name] on 10/18/2021 12:50:51 -------------------------------------------------------------------------------- Encounter Discharge Information Details Patient Name: Date of Service: [name], [name] 10/18/2021 12:45 PM Medical Record Number: [id number] Patient Account Number: [id number] Date of Birth/Sex: Treating RN: [birth year] (36 y.o. [name] Primary Care Myliyah Rebuck: [name] Other Clinician: Referring Jerri Glauser: Treating Doreen Garretson/Extender: [name] in Treatment: [phone] Encounter Discharge Information Items Post Procedure Vitals Discharge Condition:  Stable Temperature (F): 98.6 Ambulatory Status: Ambulatory Pulse (bpm): 98 Discharge Destination: Home Respiratory Rate (breaths/min): 16 Transportation: Private Auto Blood Pressure (mmHg): 132/82 Accompanied By: self Schedule Follow-up Appointment: Yes Clinical Summary of Care: Patient Declined Electronic Signature(s) Signed: 10/18/2021 5:26:27 PM By: [name] RN, BSN Entered By: [name] on 10/18/2021 17:00:23 -------------------------------------------------------------------------------- Lower Extremity Assessment Details Patient Name: Date of Service: [name], [name] 10/18/2021 12:45 PM Medical Record Number: [id number] Patient Account Number: [id number] Date of Birth/Sex: Treating RN: Jul 13, 1985 (36 y.o. [name] Primary Care Jorey Dollard: [name] Other Clinician: Referring Aujanae Mccullum: Treating Gift Rueckert/Extender: [name] in Treatment: 103 Edema Assessment Assessed: [name]: No] [name]: No] Edema: [Left: Yes] [Right: Yes] Calf Left: Right: Point of Measurement: 48 cm From Medial Instep 45.6 cm 49 cm Ankle Left: Right: Point of Measurement: 11 cm From Medial Instep 27 cm 32 cm Vascular Assessment Pulses: Dorsalis Pedis Palpable: [Left:Yes] [Right:Yes] Electronic Signature(s) Signed: 10/18/2021 5:26:27 PM By: [name] RN, BSN Entered By: [name] on 10/18/2021 13:05:33 -------------------------------------------------------------------------------- Multi Wound Chart Details Patient Name: Date of Service: [name], [name] 10/18/2021 12:45 PM Medical Record Number: [id number] Patient Account Number: [id number] Date of Birth/Sex: Treating RN: 1986-01-01 (36 y.o. [name] Primary Care Zaylyn Bergdoll: [name] Other Clinician: Referring Ademide Schaberg: Treating Cabrini Ruggieri/Extender: [name] in Treatment: 103 Vital Signs Height(in): 77 Pulse(bpm): 98 Weight(lbs):  280 Blood Pressure(mmHg): 132/82 Body Mass Index(BMI): 33.2 Temperature(F): 98.6 Respiratory Rate(breaths/min): 16 Photos: [11:Right, Anterior Foot] [12:Left, Dorsal Foot] [3:Left, Lateral, Plantar Foot] Wound Location: [11:Gradually Appeared] [12:Gradually Appeared] [3:Trauma] Wounding Event: [11:Diabetic Wound/Ulcer of the Lower] [12:Diabetic Wound/Ulcer of the Lower] [3:Diabetic Wound/Ulcer of the Lower] Primary Etiology: [11:Extremity Type II Diabetes] [12:Extremity Type II Diabetes] [3:Extremity Type II Diabetes] Comorbid History: [11:08/20/2021] [12:09/29/2021] [3:10/02/2019] Date Acquired: [11:8] [12:2] [3:103] Weeks of Treatment: [11:Open] [12:Open] [3:Open] Wound Status: [11:No] [12:No] [3:No] Wound Recurrence: [11:1.2x1.9x0.1] [12:0.5x0.7x0.1] [3:3.2x3.8x0.2] Measurements L x W x D (cm) [11:1.791] [12:0.275] [3:9.55] A (cm) :  rea [11:0.179] [12:0.027] [3:1.91] Volume (cm) : [11:-5677.40%] [12:70.80%] [3:-479.10%] % Reduction in A [11:rea: -5866.70%] [12:71.30%] [3:-1057.60%] % Reduction in Volume: [3:9] Position 1 (o'clock): [3:1.3] Maximum Distance 1 (cm): [11:No] [12:No] [3:Yes] Tunneling: [11:Grade 2] [12:Grade 2] [3:Grade 2] Classification: [11:Medium] [12:Medium] [3:Medium] Exudate A mount: [11:Serosanguineous] [12:Serosanguineous] [3:Serosanguineous] Exudate Type: [11:red, brown] [12:red, brown] [3:red, brown] Exudate Color: [11:Distinct, outline attached] [12:Distinct, outline attached] [3:Well defined, not attached] Wound Margin: [11:Large (67-100%)] [12:Medium (34-66%)] [3:Large (67-100%)] Granulation A mount: [11:Red, Pink] [12:Red] [3:Pink] Granulation Quality: [11:Small (1-33%)] [12:Medium (34-66%)] [3:Small (1-33%)] Necrotic A mount: [11:Fat Layer (Subcutaneous Tissue): Yes Fascia: No] [3:Fat Layer (Subcutaneous Tissue): Yes] Exposed Structures: [11:Fascia: No Tendon: No Muscle: No Joint: No Bone: No Small (1-33%)] [12:Fat Layer (Subcutaneous Tissue): No  Fascia: No Tendon: No Muscle: No Joint: No Bone: No Small (1-33%)] [3:Tendon: No Muscle: No Joint: No Bone: No Small (1-33%)] Epithelialization: [11:Debridement - Excisional] [12:Debridement - Excisional] [3:Debridement - Excisional] Debridement: Pre-procedure Verification/Time Out 13:14 [12:13:14] [3:13:14] Taken: [11:Necrotic/Eschar, Subcutaneous,] [12:Necrotic/Eschar, Subcutaneous,] [3:Necrotic/Eschar, Callus,] Tissue Debrided: [11:Slough Skin/Subcutaneous Tissue] [12:Slough Skin/Subcutaneous Tissue] [3:Subcutaneous, Slough Skin/Subcutaneous Tissue] Level: [11:2.28] [12:0.35] [3:12.16] Debridement A (sq cm): [11:rea Curette] [12:Curette] [3:Curette] Instrument: [11:Minimum] [12:Minimum] [3:Minimum] Bleeding: [11:Pressure] [12:Pressure] [3:Pressure] Hemostasis Achieved: [11:0] [12:0] [3:0] Procedural Pain: [11:0] [12:0] [3:0] Post Procedural Pain: Debridement Treatment Response: Procedure was tolerated well [12:Procedure was tolerated well] [3:Procedure was tolerated well] Post Debridement Measurements L x 1.2x1.9x0.1 [12:0.5x0.7x0.1] [3:3.2x3.8x0.2] W x D (cm) [11:0.179] [12:0.027] [3:1.91] Post Debridement Volume: (cm) [11:Debridement] [12:Debridement] [3:Debridement] Wound Number: 8 N/A N/A Photos: N/A N/A Right, Lateral Foot N/A N/A Wound Location: Blister N/A N/A Wounding Event: Diabetic Wound/Ulcer of the Lower N/A N/A Primary Etiology: Extremity Type II Diabetes N/A N/A Comorbid History: 04/18/2021 N/A N/A Date Acquired: 25 N/A N/A Weeks of Treatment: Open N/A N/A Wound Status: No N/A N/A Wound Recurrence: 4.2x2.7x0.2 N/A N/A Measurements L x W x D (cm) 8.906 N/A N/A A (cm) : rea 1.781 N/A N/A Volume (cm) : -1519.30% N/A N/A % Reduction in A rea: -1519.10% N/A N/A % Reduction in Volume: 3 Position 1 (o'clock): 1.8 Maximum Distance 1 (cm): Yes N/A N/A Tunneling: Grade 2 N/A N/A Classification: Large N/A N/A Exudate A mount: Serosanguineous N/A  N/A Exudate Type: red, brown N/A N/A Exudate Color: Well defined, not attached N/A N/A Wound Margin: Large (67-100%) N/A N/A Granulation A mount: Red, Pink N/A N/A Granulation Quality: Small (1-33%) N/A N/A Necrotic Amount: Fat Layer (Subcutaneous Tissue): Yes N/A N/A Exposed Structures: Fascia: No Tendon: No Muscle: No Joint: No Bone: No Small (1-33%) N/A N/A Epithelialization: Debridement - Excisional N/A N/A Debridement: Pre-procedure Verification/Time Out 13:14 N/A N/A Taken: Necrotic/Eschar, Fat, Callus, N/A N/A Tissue Debrided: Subcutaneous, Slough Skin/Subcutaneous Tissue N/A N/A Level: 11.34 N/A N/A Debridement A (sq cm): rea Curette N/A N/A Instrument: Minimum N/A N/A Bleeding: Pressure N/A N/A Hemostasis Achieved: 0 N/A N/A Procedural Pain: 0 N/A N/A Post Procedural Pain: Debridement Treatment Response: Procedure was tolerated well N/A N/A Post Debridement Measurements L x 4.2x2.7x0.2 N/A N/A W x D (cm) 1.781 N/A N/A Post Debridement Volume: (cm) Debridement N/A N/A Procedures Performed: Treatment Notes Electronic Signature(s) Signed: 10/18/2021 1:27:47 PM By: [name] MD FACS Signed: 10/19/2021 5:25:46 PM By: Adline Peals Entered By: [name] on 10/18/2021 13:27:46 -------------------------------------------------------------------------------- Multi-Disciplinary Care Plan Details Patient Name: Date of Service: [name], [name] 10/18/2021 12:45 PM Medical Record Number: [id number] Patient Account Number: [id number] Date of Birth/Sex: Treating RN: 1986/01/20 (36 y.o. [name] Primary Care Banks Chaikin: Delfina Redwood,  [name] Other Clinician: Referring Jocelyne Reinertsen: Treating Laylee Schooley/Extender: [name] in Treatment: [place] reviewed with physician Active Inactive Nutrition Nursing Diagnoses: Imbalanced nutrition Potential for alteratiion in Nutrition/Potential for  imbalanced nutrition Goals: Patient/caregiver agrees to and verbalizes understanding of need to use nutritional supplements and/or vitamins as prescribed Date Initiated: 10/24/2019 Date Inactivated: 04/06/2020 Target Resolution Date: 04/03/2020 Goal Status: Met Patient/caregiver will maintain therapeutic glucose control Date Initiated: 10/24/2019 Target Resolution Date: 10/29/2021 Goal Status: Active Interventions: Assess HgA1c results as ordered upon admission and as needed Assess patient nutrition upon admission and as needed per policy Provide education on elevated blood sugars and impact on wound healing Provide education on nutrition Treatment Activities: Education provided on Nutrition : 06/16/2021 Notes: 11/17/20: Glucose control ongoing issue, target date extended. 01/26/21: Glucose management continues. Wound/Skin Impairment Nursing Diagnoses: Impaired tissue integrity Knowledge deficit related to ulceration/compromised skin integrity Goals: Patient/caregiver will verbalize understanding of skin care regimen Date Initiated: 10/24/2019 Target Resolution Date: 10/29/2021 Goal Status: Active Ulcer/skin breakdown will have a volume reduction of 30% by week 4 Date Initiated: 10/24/2019 Date Inactivated: 01/16/2020 Target Resolution Date: 01/10/2020 Unmet Reason: no change in Goal Status: Unmet measurements. Interventions: Assess patient/caregiver ability to obtain necessary supplies Assess patient/caregiver ability to perform ulcer/skin care regimen upon admission and as needed Assess ulceration(s) every visit Provide education on ulcer and skin care Notes: 11/17/20: Wound care regimen continues Electronic Signature(s) Signed: 10/18/2021 5:26:27 PM By: [name] RN, BSN Entered By: [name] on 10/18/2021 13:10:35 -------------------------------------------------------------------------------- Pain Assessment Details Patient Name: Date of Service: [name], [name]  10/18/2021 12:45 PM Medical Record Number: [id number] Patient Account Number: [id number] Date of Birth/Sex: Treating RN: 24-Nov-1985 (36 y.o. [name] Primary Care Kimika Streater: [name] Other Clinician: Referring Clothilde Tippetts: Treating Paraskevi Funez/Extender: [name] in Treatment: [id number] Active Problems Location of Pain Severity and Description of Pain Patient Has Paino No Site Locations Pain Management and Medication Current Pain Management: Electronic Signature(s) Signed: 10/18/2021 5:26:27 PM By: [name] RN, BSN Entered By: [name] on 10/18/2021 12:51:09 -------------------------------------------------------------------------------- Patient/Caregiver Education Details Patient Name: Date of Service: [name] D 7/17/2023andnbsp12:45 PM Medical Record Number: [id number] Patient Account Number: [id number] Date of Birth/Gender: Treating RN: Nov 07, 1985 (36 y.o. [name] Primary Care Physician: [name] Other Clinician: Referring Physician: Treating Physician/Extender: [name] in Treatment: [phone] Education Assessment Education Provided To: Patient Education Topics Provided Wound/Skin Impairment: Methods: Explain/Verbal Responses: State content correctly [employer]) Signed: 10/18/2021 5:26:27 PM By: [name] RN, BSN Entered By: [name] on 10/18/2021 13:11:27 -------------------------------------------------------------------------------- Wound Assessment Details Patient Name: Date of Service: [name], [name] 10/18/2021 12:45 PM Medical Record Number: [id number] Patient Account Number: [id number] Date of Birth/Sex: Treating RN: 1985/05/06 (36 y.o. [name] Primary Care Kiley Torrence: [name] Other Clinician: Referring Lorely Bubb: Treating Madden Garron/Extender: [name] in Treatment: 103 Wound Status Wound Number:  11 Primary Etiology: Diabetic Wound/Ulcer of the Lower Extremity Wound Location: Right, Anterior Foot Wound Status: Open Wounding Event: Gradually Appeared Comorbid History: Type II Diabetes Date Acquired: 08/20/2021 Weeks Of Treatment: 8 Clustered Wound: No Photos Wound Measurements Length: (cm) 1.2 Width: (cm) 1.9 Depth: (cm) 0.1 Area: (cm) 1.791 Volume: (cm) 0.179 % Reduction in Area: -5677.4% % Reduction in Volume: -5866.7% Epithelialization: Small (1-33%) Tunneling: No Undermining: No Wound Description Classification: Grade 2 Wound Margin: Distinct, outline attached Exudate Amount: Medium Exudate Type: Serosanguineous Exudate Color: red, brown Foul Odor After  Cleansing: No Slough/Fibrino Yes Wound Bed Granulation Amount: Large (67-100%) Exposed Structure Granulation Quality: Red, Pink Fascia Exposed: No Necrotic Amount: Small (1-33%) Fat Layer (Subcutaneous Tissue) Exposed: Yes Necrotic Quality: Adherent Slough Tendon Exposed: No Muscle Exposed: No Joint Exposed: No Bone Exposed: No Treatment Notes Wound #11 (Foot) Wound Laterality: Right, Anterior Cleanser Soap and Water Discharge Instruction: May shower and wash wound with dial antibacterial soap and water prior to dressing change. Byram Ancillary Kit - 15 Day Supply Discharge Instruction: Use supplies as instructed; Kit contains: (15) Saline Bullets; (15) 3x3 Gauze; 15 pr Gloves Peri-Wound Care Topical keystone antibiotic compound Primary Dressing KerraCel Ag Gelling Fiber Dressing, 4x5 in (silver alginate) Discharge Instruction: Apply silver alginate to wound bed as instructed Secondary Dressing ABD Pad, 8x10 Discharge Instruction: Apply over primary dressing as directed. Optifoam Non-Adhesive Dressing, 4x4 in Discharge Instruction: Apply over primary dressing as directed. Zetuvit Plus 4x8 in Discharge Instruction: Apply over primary dressing as directed. NonWoven Sponge, 4x4 (in/in) Secured  With Coban Self-Adherent Wrap 4x5 (in/yd) Discharge Instruction: Secure with Coban as directed. Kerlix Roll Sterile, 4.5x3.1 (in/yd) Discharge Instruction: Secure with Kerlix as directed. 37M Medipore H Soft Cloth Surgical T ape, 4 x 10 (in/yd) Discharge Instruction: Secure with tape as directed. Compression Wrap Compression Stockings Add-Ons Electronic Signature(s) Signed: 10/18/2021 5:26:27 PM By: [name] RN, BSN Entered By: [name] on 10/18/2021 13:11:14 -------------------------------------------------------------------------------- Wound Assessment Details Patient Name: Date of Service: [name], [name] 10/18/2021 12:45 PM Medical Record Number: [id number] Patient Account Number: [id number] Date of Birth/Sex: Treating RN: 09-Oct-1985 (36 y.o. [name] Primary Care Cashis Rill: [name] Other Clinician: Referring Daire Okimoto: Treating Alette Kataoka/Extender: [name] in Treatment: 103 Wound Status Wound Number: 12 Primary Etiology: Diabetic Wound/Ulcer of the Lower Extremity Wound Location: Left, Dorsal Foot Wound Status: Open Wounding Event: Gradually Appeared Comorbid History: Type II Diabetes Date Acquired: 09/29/2021 Weeks Of Treatment: 2 Clustered Wound: No Photos Wound Measurements Length: (cm) 0.5 Width: (cm) 0.7 Depth: (cm) 0.1 Area: (cm) 0.275 Volume: (cm) 0.027 % Reduction in Area: 70.8% % Reduction in Volume: 71.3% Epithelialization: Small (1-33%) Tunneling: No Undermining: No Wound Description Classification: Grade 2 Wound Margin: Distinct, outline attached Exudate Amount: Medium Exudate Type: Serosanguineous Exudate Color: red, brown Foul Odor After Cleansing: No Slough/Fibrino Yes Wound Bed Granulation Amount: Medium (34-66%) Exposed Structure Granulation Quality: Red Fascia Exposed: No Necrotic Amount: Medium (34-66%) Fat Layer (Subcutaneous Tissue) Exposed: No Necrotic Quality: Adherent  Slough Tendon Exposed: No Muscle Exposed: No Joint Exposed: No Bone Exposed: No Treatment Notes Wound #12 (Foot) Wound Laterality: Dorsal, Left Cleanser Soap and Water Discharge Instruction: May shower and wash wound with dial antibacterial soap and water prior to dressing change. Byram Ancillary Kit - 15 Day Supply Discharge Instruction: Use supplies as instructed; Kit contains: (15) Saline Bullets; (15) 3x3 Gauze; 15 pr Gloves Peri-Wound Care Topical keystone antibiotic compound Primary Dressing KerraCel Ag Gelling Fiber Dressing, 4x5 in (silver alginate) Discharge Instruction: Apply silver alginate to wound bed as instructed Secondary Dressing ABD Pad, 8x10 Discharge Instruction: Apply over primary dressing as directed. NonWoven Sponge, 4x4 (in/in) Secured With Coban Self-Adherent Wrap 4x5 (in/yd) Discharge Instruction: Secure with Coban as directed. Kerlix Roll Sterile, 4.5x3.1 (in/yd) Discharge Instruction: Secure with Kerlix as directed. 37M Medipore H Soft Cloth Surgical T ape, 4 x 10 (in/yd) Discharge Instruction: Secure with tape as directed. Compression Wrap Compression Stockings Add-Ons Electronic Signature(s) Signed: 10/18/2021 5:26:27 PM By: [name] RN, BSN Entered By: [name] on 10/18/2021  13:11:54 -------------------------------------------------------------------------------- Wound Assessment Details Patient Name: Date of Service: [name] 10/18/2021 12:45 PM Medical Record Number: [id number] Patient Account Number: [id number] Date of Birth/Sex: Treating RN: 1985-06-25 (36 y.o. [name] Primary Care Bernadene Garside: [name] Other Clinician: Referring Kodee Ravert: Treating Anaih Brander/Extender: [name] in Treatment: 103 Wound Status Wound Number: 3 Primary Etiology: Diabetic Wound/Ulcer of the Lower Extremity Wound Location: Left, Lateral, Plantar Foot Wound Status: Open Wounding Event:  Trauma Comorbid History: Type II Diabetes Date Acquired: 10/02/2019 Weeks Of Treatment: 103 Clustered Wound: No Photos Wound Measurements Length: (cm) 3.2 Width: (cm) 3.8 Depth: (cm) 0.2 Area: (cm) 9.55 Volume: (cm) 1.91 % Reduction in Area: -479.1% % Reduction in Volume: -1057.6% Epithelialization: Small (1-33%) Tunneling: Yes Position (o'clock): 9 Maximum Distance: (cm) 1.3 Undermining: No Wound Description Classification: Grade 2 Wound Margin: Well defined, not attached Exudate Amount: Medium Exudate Type: Serosanguineous Exudate Color: red, brown Foul Odor After Cleansing: No Slough/Fibrino Yes Wound Bed Granulation Amount: Large (67-100%) Exposed Structure Granulation Quality: Pink Fascia Exposed: No Necrotic Amount: Small (1-33%) Fat Layer (Subcutaneous Tissue) Exposed: Yes Necrotic Quality: Adherent Slough Tendon Exposed: No Muscle Exposed: No Joint Exposed: No Bone Exposed: No Treatment Notes Wound #3 (Foot) Wound Laterality: Plantar, Left, Lateral Cleanser Soap and Water Discharge Instruction: May shower and wash wound with dial antibacterial soap and water prior to dressing change. Byram Ancillary Kit - 15 Day Supply Discharge Instruction: Use supplies as instructed; Kit contains: (15) Saline Bullets; (15) 3x3 Gauze; 15 pr Gloves Peri-Wound Care Topical keystone antibiotic compound Primary Dressing KerraCel Ag Gelling Fiber Dressing, 4x5 in (silver alginate) Discharge Instruction: Apply silver alginate to wound bed as instructed Secondary Dressing ABD Pad, 8x10 Discharge Instruction: Apply over primary dressing as directed. Optifoam Non-Adhesive Dressing, 4x4 in Discharge Instruction: Apply over primary dressing as directed. Zetuvit Plus 4x8 in Discharge Instruction: Apply over primary dressing as directed. NonWoven Sponge, 4x4 (in/in) Secured With Coban Self-Adherent Wrap 4x5 (in/yd) Discharge Instruction: Secure with Coban as directed. Kerlix  Roll Sterile, 4.5x3.1 (in/yd) Discharge Instruction: Secure with Kerlix as directed. 30M Medipore H Soft Cloth Surgical Tape, 4 x 10 (in/yd) Discharge Instruction: Secure with tape as directed. Compression Wrap Compression Stockings Add-Ons Electronic Signature(s) Signed: 10/18/2021 5:26:27 PM By: [name] RN, BSN Entered By: [name] on 10/18/2021 13:09:18 -------------------------------------------------------------------------------- Wound Assessment Details Patient Name: Date of Service: [name], [name] 10/18/2021 12:45 PM Medical Record Number: [id number] Patient Account Number: [id number] Date of Birth/Sex: Treating RN: January 16, 1986 (36 y.o. [name] Primary Care Maybree Riling: [name] Other Clinician: Referring Iyanni Hepp: Treating Idabell Picking/Extender: [name] in Treatment: 103 Wound Status Wound Number: 8 Primary Etiology: Diabetic Wound/Ulcer of the Lower Extremity Wound Location: Right, Lateral Foot Wound Status: Open Wounding Event: Blister Comorbid History: Type II Diabetes Date Acquired: 04/18/2021 Weeks Of Treatment: 25 Clustered Wound: No Photos Wound Measurements Length: (cm) 4.2 Width: (cm) 2.7 Depth: (cm) 0.2 Area: (cm) 8.906 Volume: (cm) 1.781 % Reduction in Area: -1519.3% % Reduction in Volume: -1519.1% Epithelialization: Small (1-33%) Tunneling: Yes Position (o'clock): 3 Maximum Distance: (cm) 1.8 Undermining: No Wound Description Classification: Grade 2 Wound Margin: Well defined, not attached Exudate Amount: Large Exudate Type: Serosanguineous Exudate Color: red, brown Foul Odor After Cleansing: No Slough/Fibrino Yes Wound Bed Granulation Amount: Large (67-100%) Exposed Structure Granulation Quality: Red, Pink Fascia Exposed: No Necrotic Amount: Small (1-33%) Fat Layer (Subcutaneous Tissue) Exposed: Yes Necrotic Quality: Adherent Slough Tendon Exposed: No Muscle Exposed: No Joint  Exposed: No Bone Exposed: No Treatment Notes Wound #8 (Foot) Wound Laterality: Right, Lateral Cleanser Soap and Water Discharge Instruction: May shower and wash wound with dial antibacterial soap and water prior to dressing change. Byram Ancillary Kit - 15 Day Supply Discharge Instruction: Use supplies as instructed; Kit contains: (15) Saline Bullets; (15) 3x3 Gauze; 15 pr Gloves Peri-Wound Care Topical keystone antibiotic compound Primary Dressing KerraCel Ag Gelling Fiber Dressing, 4x5 in (silver alginate) Discharge Instruction: Apply silver alginate to wound bed as instructed Secondary Dressing ABD Pad, 8x10 Discharge Instruction: Apply over primary dressing as directed. NonWoven Sponge, 4x4 (in/in) Secured With Coban Self-Adherent Wrap 4x5 (in/yd) Discharge Instruction: Secure with Coban as directed. Kerlix Roll Sterile, 4.5x3.1 (in/yd) Discharge Instruction: Secure with Kerlix as directed. 40M Medipore H Soft Cloth Surgical T ape, 4 x 10 (in/yd) Discharge Instruction: Secure with tape as directed. Compression Wrap Compression Stockings Add-Ons Electronic Signature(s) Signed: 10/18/2021 5:26:27 PM By: [name] RN, BSN Entered By: [name] on 10/18/2021 13:10:25 -------------------------------------------------------------------------------- Vitals Details Patient Name: Date of Service: [name], [name] 10/18/2021 12:45 PM Medical Record Number: [id number] Patient Account Number: [id number] Date of Birth/Sex: Treating RN: 21-Jun-1985 (36 y.o. [name] Primary Care Ladislav Caselli: [name] Other Clinician: Referring Nathen Balaban: Treating Dyamon Sosinski/Extender: [name] in Treatment: 103 Vital Signs Time Taken: 13:00 Temperature (F): 98.6 Height (in): 77 Pulse (bpm): 98 Weight (lbs): 280 Respiratory Rate (breaths/min): 16 Body Mass Index (BMI): 33.2 Blood Pressure (mmHg): 132/82 Reference Range: 80 - 120 mg /  dl Electronic Signature(s) Signed: 10/18/2021 5:26:27 PM By: [name] RN, BSN Entered By: [name] on 10/18/2021 13:04:57

## 2021-10-25 ENCOUNTER — Other Ambulatory Visit: Payer: Self-pay | Admitting: General Surgery

## 2021-10-25 ENCOUNTER — Encounter (HOSPITAL_BASED_OUTPATIENT_CLINIC_OR_DEPARTMENT_OTHER): Payer: BC Managed Care – PPO | Admitting: General Surgery

## 2021-10-25 ENCOUNTER — Other Ambulatory Visit (HOSPITAL_COMMUNITY): Payer: Self-pay | Admitting: General Surgery

## 2021-10-25 DIAGNOSIS — E10628 Type 1 diabetes mellitus with other skin complications: Secondary | ICD-10-CM | POA: Diagnosis not present

## 2021-10-25 DIAGNOSIS — M79671 Pain in right foot: Secondary | ICD-10-CM | POA: Diagnosis not present

## 2021-10-25 NOTE — Progress Notes (Signed)
[place], [name] ([id number]) Visit Report for 10/25/2021 Arrival Information Details Patient Name: Date of Service: [name] 10/25/2021 3:30 PM Medical Record Number: [id number] Patient Account Number: [id number] Date of Birth/Sex: Treating RN: 23-Jan-1986 (36 y.o. [name] Primary Care Rosann Gorum: [name] Other Clinician: Referring Verma Grothaus: Treating Oanh Devivo/Extender: [name] in Treatment: 14 Visit Information History Since Last Visit Added or deleted any medications: No Patient Arrived: Ambulatory Any new allergies or adverse reactions: No Arrival Time: 15:43 Had a fall or experienced change in No Accompanied By: self activities of daily living that may affect Transfer Assistance: None risk of falls: Patient Identification Verified: Yes Signs or symptoms of abuse/neglect since last visito No Secondary Verification Process Completed: Yes Hospitalized since last visit: No Patient Requires Transmission-Based Precautions: No Implantable device outside of the clinic excluding No Patient Has Alerts: No cellular tissue based products placed in the center since last visit: Has Dressing in Place as Prescribed: Yes Has Compression in Place as Prescribed: Yes Pain Present Now: No Electronic Signature(s) Signed: 10/25/2021 5:44:10 PM By: Baruch Gouty RN, BSN Entered By: Baruch Gouty on 10/25/2021 15:47:16 -------------------------------------------------------------------------------- Encounter Discharge Information Details Patient Name: Date of Service: [name], [name] 10/25/2021 3:30 PM Medical Record Number: [id number] Patient Account Number: [id number] Date of Birth/Sex: Treating RN: 05-11-1985 (36 y.o. [name] Primary Care Bryndon Cumbie: [name] Other Clinician: Referring Elai Vanwyk: Treating Sela Falk/Extender: [name] in Treatment: [phone] Encounter Discharge Information Items  Post Procedure Vitals Discharge Condition: Stable Temperature (F): 98.6 Ambulatory Status: Ambulatory Pulse (bpm): 103 Discharge Destination: Home Respiratory Rate (breaths/min): 18 Transportation: Private Auto Blood Pressure (mmHg): 133/82 Accompanied By: self Schedule Follow-up Appointment: Yes Clinical Summary of Care: Electronic Signature(s) Signed: 10/25/2021 5:44:10 PM By: Baruch Gouty RN, BSN Entered By: Baruch Gouty on 10/25/2021 16:45:11 -------------------------------------------------------------------------------- Lower Extremity Assessment Details Patient Name: Date of Service: [name], [name] 10/25/2021 3:30 PM Medical Record Number: [id number] Patient Account Number: [id number] Date of Birth/Sex: Treating RN: December 20, 1985 (36 y.o. [name] Primary Care Densil Ottey: [name] Other Clinician: Referring Akira Perusse: Treating Sarabeth Benton/Extender: [name] in Treatment: 104 Edema Assessment Assessed: [name]: No] [name]: No] Edema: [Left: Yes] [Right: Yes] Calf Left: Right: Point of Measurement: 48 cm From Medial Instep 48 cm 47.8 cm Ankle Left: Right: Point of Measurement: 11 cm From Medial Instep 27.7 cm 34.8 cm Vascular Assessment Pulses: Dorsalis Pedis Palpable: [Left:Yes] [Right:Yes] Electronic Signature(s) Signed: 10/25/2021 5:44:10 PM By: Baruch Gouty RN, BSN Entered By: Baruch Gouty on 10/25/2021 16:04:04 -------------------------------------------------------------------------------- Multi Wound Chart Details Patient Name: Date of Service: [name], [name] 10/25/2021 3:30 PM Medical Record Number: [id number] Patient Account Number: [id number] Date of Birth/Sex: Treating RN: [birth year] (36 y.o. [name] Primary Care Vikrant Pryce: [name] Other Clinician: Referring Danyel Griess: Treating Dequavius Kuhner/Extender: [name] in Treatment: 104 Vital Signs Height(in):  77 Capillary Blood Glucose(mg/dl): 79 Weight(lbs): 280 Pulse(bpm): 103 Body Mass Index(BMI): 33.2 Blood Pressure(mmHg): 133/83 Temperature(F): 98.6 Respiratory Rate(breaths/min): 18 Photos: Right, Anterior Foot Left, Dorsal Foot Left, Lateral, Plantar Foot Wound Location: Gradually Appeared Gradually Appeared Trauma Wounding Event: Diabetic Wound/Ulcer of the Lower Diabetic Wound/Ulcer of the Lower Diabetic Wound/Ulcer of the Lower Primary Etiology: Extremity Extremity Extremity Type II Diabetes Type II Diabetes Type II Diabetes Comorbid History: 08/20/2021 09/29/2021 10/02/2019 Date Acquired: 9 3 104 Weeks of Treatment: Open Open Open Wound Status: No No No Wound Recurrence: 1.3x1.8x0.1 1.2x1.2x0.1 4.4x3.8x2.1 Measurements L x W  x D (cm) 1.838 1.131 13.132 A (cm) : rea 0.184 0.113 27.577 Volume (cm) : -5829.00% -20.10% -696.40% % Reduction in A rea: -6033.30% -20.20% -16613.30% % Reduction in Volume: 3 Starting Position 1 (o'clock): 9 Ending Position 1 (o'clock): 1.7 Maximum Distance 1 (cm): No No Yes Undermining: Grade 2 Grade 2 Grade 2 Classification: Medium Medium Medium Exudate A mount: Serosanguineous Serosanguineous Serosanguineous Exudate Type: red, brown red, brown red, brown Exudate Color: Distinct, outline attached Distinct, outline attached Well defined, not attached Wound Margin: Medium (34-66%) Medium (34-66%) Small (1-33%) Granulation A mount: Red, Pink Red Pink Granulation Quality: Medium (34-66%) Medium (34-66%) Medium (34-66%) Necrotic A mount: Fat Layer (Subcutaneous Tissue): Yes Fascia: No Fat Layer (Subcutaneous Tissue): Yes Exposed Structures: Fascia: No Fat Layer (Subcutaneous Tissue): No Fascia: No Tendon: No Tendon: No Tendon: No Muscle: No Muscle: No Muscle: No Joint: No Joint: No Joint: No Bone: No Bone: No Bone: No Small (1-33%) Small (1-33%) Small (1-33%) Epithelialization: Debridement - Selective/Open Wound  Debridement - Excisional Debridement - Excisional Debridement: Pre-procedure Verification/Time Out 16:27 16:27 16:27 Taken: [employer], Slough Callus, Subcutaneous, Slough Tissue Debrided: Non-Viable Tissue Skin/Subcutaneous Tissue Skin/Subcutaneous Tissue Level: 2.34 1.44 16.72 Debridement A (sq cm): rea Curette Curette Curette Instrument: Minimum Minimum Minimum Bleeding: Pressure Pressure Pressure Hemostasis A chieved: 0 0 0 Procedural Pain: 0 0 0 Post Procedural Pain: Procedure was tolerated well Procedure was tolerated well Procedure was tolerated well Debridement Treatment Response: 1.3x1.8x0.1 1.2x1.2x0.1 4.4x3.8x2.1 Post Debridement Measurements L x W x D (cm) 0.184 0.113 27.577 Post Debridement Volume: (cm) Debridement Debridement Debridement Procedures Performed: Wound Number: 8 N/A N/A Photos: N/A N/A Right, Lateral Foot N/A N/A Wound Location: Blister N/A N/A Wounding Event: Diabetic Wound/Ulcer of the Lower N/A N/A Primary Etiology: Extremity Type II Diabetes N/A N/A Comorbid History: 04/18/2021 N/A N/A Date Acquired: 72 N/A N/A Weeks of Treatment: Open N/A N/A Wound Status: No N/A N/A Wound Recurrence: 4.2x3x1.1 N/A N/A Measurements L x W x D (cm) 9.896 N/A N/A A (cm) : rea 10.886 N/A N/A Volume (cm) : -1699.30% N/A N/A % Reduction in A rea: -9796.40% N/A N/A % Reduction in Volume: No N/A N/A Undermining: Grade 2 N/A N/A Classification: Large N/A N/A Exudate A mount: Serosanguineous N/A N/A Exudate Type: red, brown N/A N/A Exudate Color: Well defined, not attached N/A N/A Wound Margin: Medium (34-66%) N/A N/A Granulation A mount: Red, Pink N/A N/A Granulation Quality: Medium (34-66%) N/A N/A Necrotic A mount: Fat Layer (Subcutaneous Tissue): Yes N/A N/A Exposed Structures: Fascia: No Tendon: No Muscle: No Joint: No Bone: No Small (1-33%) N/A N/A Epithelialization: Debridement - Excisional N/A  N/A Debridement: Pre-procedure Verification/Time Out 16:27 N/A N/A Taken: Callus, Subcutaneous, Slough N/A N/A Tissue Debrided: Skin/Subcutaneous Tissue N/A N/A Level: 12.6 N/A N/A Debridement A (sq cm): rea Curette N/A N/A Instrument: Minimum N/A N/A Bleeding: Pressure N/A N/A Hemostasis A chieved: 0 N/A N/A Procedural Pain: 0 N/A N/A Post Procedural Pain: Procedure was tolerated well N/A N/A Debridement Treatment Response: 4.2x3x1.1 N/A N/A Post Debridement Measurements L x W x D (cm) 10.886 N/A N/A Post Debridement Volume: (cm) Debridement N/A N/A Procedures Performed: Treatment Notes Wound #11 (Foot) Wound Laterality: Right, Anterior Cleanser Peri-Wound Care Topical Primary Dressing Secondary Dressing Secured With Compression Wrap Compression Stockings Add-Ons Wound #12 (Foot) Wound Laterality: Dorsal, Left Cleanser Peri-Wound Care Topical Primary Dressing Secondary Dressing Secured With Compression Wrap Compression Stockings Add-Ons Wound #3 (Foot) Wound Laterality: Plantar, Left, Lateral Cleanser Peri-Wound Care Topical Primary Dressing Secondary Dressing  Secured With Compression Wrap Compression Stockings Add-Ons Wound #8 (Foot) Wound Laterality: Right, Lateral Cleanser Peri-Wound Care Topical Primary Dressing Secondary Dressing Secured With Compression Wrap Compression Stockings Add-Ons Electronic Signature(s) Signed: 10/25/2021 4:58:11 PM By: [name] MD FACS Signed: 10/25/2021 5:44:10 PM By: Baruch Gouty RN, BSN Entered By: [name] on 10/25/2021 16:58:11 -------------------------------------------------------------------------------- Multi-Disciplinary Care Plan Details Patient Name: Date of Service: [name], [name] 10/25/2021 3:30 PM Medical Record Number: [id number] Patient Account Number: [id number] Date of Birth/Sex: Treating RN: [birth year] (36 y.o. [name] Primary Care Kasai [name]: [name] Other Clinician: Referring Jamyiah Labella: Treating Marisella Puccio/Extender: [name] in Treatment: [place] reviewed with physician Active Inactive Nutrition Nursing Diagnoses: Imbalanced nutrition Potential for alteratiion in Nutrition/Potential for imbalanced nutrition Goals: Patient/caregiver agrees to and verbalizes understanding of need to use nutritional supplements and/or vitamins as prescribed Date Initiated: 10/24/2019 Date Inactivated: 04/06/2020 Target Resolution Date: 04/03/2020 Goal Status: Met Patient/caregiver will maintain therapeutic glucose control Date Initiated: 10/24/2019 Target Resolution Date: 10/29/2021 Goal Status: Active Interventions: Assess HgA1c results as ordered upon admission and as needed Assess patient nutrition upon admission and as needed per policy Provide education on elevated blood sugars and impact on wound healing Provide education on nutrition Treatment Activities: Education provided on Nutrition : 12/02/2020 Notes: 11/17/20: Glucose control ongoing issue, target date extended. 01/26/21: Glucose management continues. Wound/Skin Impairment Nursing Diagnoses: Impaired tissue integrity Knowledge deficit related to ulceration/compromised skin integrity Goals: Patient/caregiver will verbalize understanding of skin care regimen Date Initiated: 10/24/2019 Target Resolution Date: 10/29/2021 Goal Status: Active Ulcer/skin breakdown will have a volume reduction of 30% by week 4 Date Initiated: 10/24/2019 Date Inactivated: 01/16/2020 Target Resolution Date: 01/10/2020 Unmet Reason: no change in Goal Status: Unmet measurements. Interventions: Assess patient/caregiver ability to obtain necessary supplies Assess patient/caregiver ability to perform ulcer/skin care regimen upon admission and as needed Assess ulceration(s) every visit Provide education on ulcer and skin care Notes: 11/17/20: Wound care  regimen continues Electronic Signature(s) Signed: 10/25/2021 5:44:10 PM By: Baruch Gouty RN, BSN Entered By: Baruch Gouty on 10/25/2021 16:11:54 -------------------------------------------------------------------------------- Pain Assessment Details Patient Name: Date of Service: [name], [name] 10/25/2021 3:30 PM Medical Record Number: [id number] Patient Account Number: [id number] Date of Birth/Sex: Treating RN: 05-Sep-1985 (36 y.o. [name] Primary Care Alexavier Tsutsui: [name] Other Clinician: Referring Millianna Szymborski: Treating Divine Imber/Extender: [name] in Treatment: 104 Active Problems Location of Pain Severity and Description of Pain Patient Has Paino No Site Locations Rate the pain. Current Pain Level: 0 Pain Management and Medication Current Pain Management: Electronic Signature(s) Signed: 10/25/2021 5:44:10 PM By: Baruch Gouty RN, BSN Entered By: Baruch Gouty on 10/25/2021 15:53:17 -------------------------------------------------------------------------------- Patient/Caregiver Education Details Patient Name: Date of Service: [name] 7/24/2023andnbsp3:30 PM Medical Record Number: [id number] Patient Account Number: [id number] Date of Birth/Gender: Treating RN: 01/29/1986 (36 y.o. [name] Primary Care Physician: [name] Other Clinician: Referring Physician: Treating Physician/Extender: [name] in Treatment: 104 Education Assessment Education Provided To: Patient Education Topics Provided Elevated Blood Sugar/ Impact on Healing: Methods: Explain/Verbal Responses: Reinforcements needed, State content correctly Nutrition: Methods: Explain/Verbal Responses: Reinforcements needed, State content correctly Wound/Skin Impairment: Methods: Explain/Verbal Responses: Reinforcements needed, State content correctly Electronic Signature(s) Signed: 10/25/2021 5:44:10  PM By: Baruch Gouty RN, BSN Entered By: Baruch Gouty on 10/25/2021 16:12:46 -------------------------------------------------------------------------------- Wound Assessment Details Patient Name: Date of Service: [name], [name] 10/25/2021 3:30 PM Medical Record Number: [id number] Patient Account Number:  [id number] Date of Birth/Sex: Treating RN: 11/19/1985 (36 y.o. [name] Primary Care Serenitee Fuertes: [name] Other Clinician: Referring Johnhenry Tippin: Treating Darik Massing/Extender: [name] in Treatment: 104 Wound Status Wound Number: 11 Primary Etiology: Diabetic Wound/Ulcer of the Lower Extremity Wound Location: Right, Anterior Foot Wound Status: Open Wounding Event: Gradually Appeared Comorbid History: Type II Diabetes Date Acquired: 08/20/2021 Weeks Of Treatment: 9 Clustered Wound: No Photos Wound Measurements Length: (cm) 1.3 Width: (cm) 1.8 Depth: (cm) 0.1 Area: (cm) 1.838 Volume: (cm) 0.184 % Reduction in Area: -5829% % Reduction in Volume: -6033.3% Epithelialization: Small (1-33%) Tunneling: No Undermining: No Wound Description Classification: Grade 2 Wound Margin: Distinct, outline attached Exudate Amount: Medium Exudate Type: Serosanguineous Exudate Color: red, brown Foul Odor After Cleansing: No Slough/Fibrino Yes Wound Bed Granulation Amount: Medium (34-66%) Exposed Structure Granulation Quality: Red, Pink Fascia Exposed: No Necrotic Amount: Medium (34-66%) Fat Layer (Subcutaneous Tissue) Exposed: Yes Necrotic Quality: Adherent Slough Tendon Exposed: No Muscle Exposed: No Joint Exposed: No Bone Exposed: No Treatment Notes Wound #11 (Foot) Wound Laterality: Right, Anterior Cleanser Peri-Wound Care Topical Primary Dressing Secondary Dressing Secured With Compression Wrap Compression Stockings Add-Ons Electronic Signature(s) Signed: 10/25/2021 5:44:10 PM By: Baruch Gouty RN, BSN Entered By:  Baruch Gouty on 10/25/2021 16:08:24 -------------------------------------------------------------------------------- Wound Assessment Details Patient Name: Date of Service: [name], [name] 10/25/2021 3:30 PM Medical Record Number: [id number] Patient Account Number: [id number] Date of Birth/Sex: Treating RN: February 08, 1986 (36 y.o. [name] Primary Care Obdulia Steier: [name] Other Clinician: Referring Kensi Karr: Treating Maddilyn Campus/Extender: [name] in Treatment: 104 Wound Status Wound Number: 12 Primary Etiology: Diabetic Wound/Ulcer of the Lower Extremity Wound Location: Left, Dorsal Foot Wound Status: Open Wounding Event: Gradually Appeared Comorbid History: Type II Diabetes Date Acquired: 09/29/2021 Weeks Of Treatment: 3 Clustered Wound: No Photos Wound Measurements Length: (cm) 1.2 Width: (cm) 1.2 Depth: (cm) 0.1 Area: (cm) 1.131 Volume: (cm) 0.113 % Reduction in Area: -20.1% % Reduction in Volume: -20.2% Epithelialization: Small (1-33%) Tunneling: No Undermining: No Wound Description Classification: Grade 2 Wound Margin: Distinct, outline attached Exudate Amount: Medium Exudate Type: Serosanguineous Exudate Color: red, brown Foul Odor After Cleansing: No Slough/Fibrino Yes Wound Bed Granulation Amount: Medium (34-66%) Exposed Structure Granulation Quality: Red Fascia Exposed: No Necrotic Amount: Medium (34-66%) Fat Layer (Subcutaneous Tissue) Exposed: No Necrotic Quality: Adherent Slough Tendon Exposed: No Muscle Exposed: No Joint Exposed: No Bone Exposed: No Treatment Notes Wound #12 (Foot) Wound Laterality: Dorsal, Left Cleanser Peri-Wound Care Topical Primary Dressing Secondary Dressing Secured With Compression Wrap Compression Stockings Add-Ons Electronic Signature(s) Signed: 10/25/2021 5:44:10 PM By: Baruch Gouty RN, BSN Entered By: Baruch Gouty on 10/25/2021  16:09:01 -------------------------------------------------------------------------------- Wound Assessment Details Patient Name: Date of Service: [name], [name] 10/25/2021 3:30 PM Medical Record Number: [id number] Patient Account Number: [id number] Date of Birth/Sex: Treating RN: 30-Nov-1985 (36 y.o. [name] Primary Care Amory Zbikowski: [name] Other Clinician: Referring Edilson Vital: Treating Somer Trotter/Extender: [name] in Treatment: 104 Wound Status Wound Number: 3 Primary Etiology: Diabetic Wound/Ulcer of the Lower Extremity Wound Location: Left, Lateral, Plantar Foot Wound Status: Open Wounding Event: Trauma Comorbid History: Type II Diabetes Date Acquired: 10/02/2019 Weeks Of Treatment: 104 Clustered Wound: No Photos Wound Measurements Length: (cm) 4.4 % R Width: (cm) 3.8 % R Depth: (cm) 2.1 Epi Area: (cm) 13.132 Tu Volume: (cm) 27.577 Un eduction in Area: -696.4% eduction in Volume: -16613.3% thelialization: Small (1-33%) nneling: No dermining: Yes Starting Position (o'clock): 3 Ending Position (o'clock): 9  Maximum Distance: (cm) 1.7 Wound Description Classification: Grade 2 Fo Wound Margin: Well defined, not attached Sl Exudate Amount: Medium Exudate Type: Serosanguineous Exudate Color: red, brown ul Odor After Cleansing: No ough/Fibrino Yes Wound Bed Granulation Amount: Small (1-33%) Exposed Structure Granulation Quality: Pink Fascia Exposed: No Necrotic Amount: Medium (34-66%) Fat Layer (Subcutaneous Tissue) Exposed: Yes Necrotic Quality: Adherent Slough Tendon Exposed: No Muscle Exposed: No Joint Exposed: No Bone Exposed: No Treatment Notes Wound #3 (Foot) Wound Laterality: Plantar, Left, Lateral Cleanser Peri-Wound Care Topical Primary Dressing Secondary Dressing Secured With Compression Wrap Compression Stockings Add-Ons Electronic Signature(s) Signed: 10/25/2021 5:44:10 PM By: Baruch Gouty RN,  BSN Entered By: Baruch Gouty on 10/25/2021 16:10:36 -------------------------------------------------------------------------------- Wound Assessment Details Patient Name: Date of Service: [name], [name] 10/25/2021 3:30 PM Medical Record Number: [id number] Patient Account Number: [id number] Date of Birth/Sex: Treating RN: Sep 01, 1985 (36 y.o. [name] Primary Care Cairo Agostinelli: [name] Other Clinician: Referring Promise Weldin: Treating Damon Hargrove/Extender: [name] in Treatment: 104 Wound Status Wound Number: 8 Primary Etiology: Diabetic Wound/Ulcer of the Lower Extremity Wound Location: Right, Lateral Foot Wound Status: Open Wounding Event: Blister Comorbid History: Type II Diabetes Date Acquired: 04/18/2021 Weeks Of Treatment: 26 Clustered Wound: No Photos Wound Measurements Length: (cm) 4.2 Width: (cm) 3 Depth: (cm) 1.1 Area: (cm) 9.896 Volume: (cm) 10.886 % Reduction in Area: -1699.3% % Reduction in Volume: -9796.4% Epithelialization: Small (1-33%) Tunneling: No Undermining: No Wound Description Classification: Grade 2 Wound Margin: Well defined, not attached Exudate Amount: Large Exudate Type: Serosanguineous Exudate Color: red, brown Foul Odor After Cleansing: No Slough/Fibrino Yes Wound Bed Granulation Amount: Medium (34-66%) Exposed Structure Granulation Quality: Red, Pink Fascia Exposed: No Necrotic Amount: Medium (34-66%) Fat Layer (Subcutaneous Tissue) Exposed: Yes Necrotic Quality: Adherent Slough Tendon Exposed: No Muscle Exposed: No Joint Exposed: No Bone Exposed: No Treatment Notes Wound #8 (Foot) Wound Laterality: Right, Lateral Cleanser Peri-Wound Care Topical Primary Dressing Secondary Dressing Secured With Compression Wrap Compression Stockings Add-Ons Electronic Signature(s) Signed: 10/25/2021 5:44:10 PM By: Baruch Gouty RN, BSN Entered By: Baruch Gouty on 10/25/2021  16:11:16 -------------------------------------------------------------------------------- Vitals Details Patient Name: Date of Service: [name], [name] 10/25/2021 3:30 PM Medical Record Number: [id number] Patient Account Number: [id number] Date of Birth/Sex: Treating RN: 08-27-1985 (36 y.o. [name] Primary Care Eriyana Sweeten: [name] Other Clinician: Referring Jacayla Nordell: Treating Jesaiah Fabiano/Extender: [name] in Treatment: 104 Vital Signs Time Taken: 15:48 Temperature (F): 98.6 Height (in): 77 Pulse (bpm): 103 Weight (lbs): 280 Respiratory Rate (breaths/min): 18 Body Mass Index (BMI): 33.2 Blood Pressure (mmHg): 133/83 Capillary Blood Glucose (mg/dl): 79 Reference Range: 80 - 120 mg / dl Notes glucose per pt report this am Electronic Signature(s) Signed: 10/25/2021 5:44:10 PM By: Baruch Gouty RN, BSN Entered By: Baruch Gouty on 10/25/2021 15:52:42

## 2021-10-25 NOTE — Progress Notes (Signed)
[place], [name] ([id number]) Visit Report for 10/25/2021 Chief Complaint Document Details Patient Name: Date of Service: [name] 10/25/2021 3:30 PM Medical Record Number: [id number] Patient Account Number: [id number] Date of Birth/Sex: Treating RN: February 08, 1986 (36 y.o. [name] Primary Care Provider: [name] Other Clinician: Referring Provider: Treating Provider/Extender: [name] in Treatment: 104 Information Obtained from: Patient Chief Complaint 01/11/2019; patient is here for review of a rather substantial wound over the left fifth plantar metatarsal head extending into the lateral part of his foot 10/24/2019; patient returns to clinic with wounds on his bilateral feet with underlying osteomyelitis biopsy-proven Electronic Signature(s) Signed: 10/25/2021 4:58:24 PM By: [name] MD FACS Entered By: [name] on 10/25/2021 16:58:23 -------------------------------------------------------------------------------- Debridement Details Patient Name: Date of Service: [name], [name] 10/25/2021 3:30 PM Medical Record Number: [id number] Patient Account Number: [id number] Date of Birth/Sex: Treating RN: [birth year] (36 y.o. [name] Primary Care Provider: [name] Other Clinician: Referring Provider: Treating Provider/Extender: [name] in Treatment: 104 Debridement Performed for Assessment: Wound #11 [employer] Performed By: Physician [name], MD Debridement Type: Debridement Severity of Tissue Pre Debridement: Fat layer exposed Level of Consciousness (Pre-procedure): Awake and Alert Pre-procedure Verification/Time Out Yes - 16:27 Taken: Start Time: 16:27 T Area Debrided (L x W): otal 1.3 (cm) x 1.8 (cm) = 2.34 (cm) Tissue and other material debrided: Non-Viable, Slough, Slough Level: Non-Viable Tissue Debridement Description: Selective/Open  Wound Instrument: Curette Bleeding: Minimum Hemostasis Achieved: Pressure Procedural Pain: 0 Post Procedural Pain: 0 Response to Treatment: Procedure was tolerated well Level of Consciousness (Post- Awake and Alert procedure): Post Debridement Measurements of Total Wound Length: (cm) 1.3 Width: (cm) 1.8 Depth: (cm) 0.1 Volume: (cm) 0.184 Character of Wound/Ulcer Post Debridement: Stable Severity of Tissue Post Debridement: Fat layer exposed Post Procedure Diagnosis Same as Pre-procedure Electronic Signature(s) Signed: 10/25/2021 5:07:36 PM By: [name] MD FACS Signed: 10/25/2021 5:44:10 PM By: Baruch Gouty RN, BSN Entered By: Baruch Gouty on 10/25/2021 16:29:13 -------------------------------------------------------------------------------- Debridement Details Patient Name: Date of Service: [name], [name] 10/25/2021 3:30 PM Medical Record Number: [id number] Patient Account Number: [id number] Date of Birth/Sex: Treating RN: 11/28/1985 (36 y.o. [name] Primary Care Provider: [name] Other Clinician: Referring Provider: Treating Provider/Extender: [name] in Treatment: 104 Debridement Performed for Assessment: Wound #8 Right,Lateral Foot Performed By: Physician [name], MD Debridement Type: Debridement Severity of Tissue Pre Debridement: Fat layer exposed Level of Consciousness (Pre-procedure): Awake and Alert Pre-procedure Verification/Time Out Yes - 16:27 Taken: Start Time: 16:27 T Area Debrided (L x W): otal 4.2 (cm) x 3 (cm) = 12.6 (cm) Tissue and other material debrided: Viable, Non-Viable, Callus, Slough, Subcutaneous, Slough Level: Skin/Subcutaneous Tissue Debridement Description: Excisional Instrument: Curette Bleeding: Minimum Hemostasis Achieved: Pressure Procedural Pain: 0 Post Procedural Pain: 0 Response to Treatment: Procedure was tolerated well Level of Consciousness (Post- Awake  and Alert procedure): Post Debridement Measurements of Total Wound Length: (cm) 4.2 Width: (cm) 3 Depth: (cm) 1.1 Volume: (cm) 10.886 Character of Wound/Ulcer Post Debridement: Stable Severity of Tissue Post Debridement: Fat layer exposed Post Procedure Diagnosis Same as Pre-procedure Electronic Signature(s) Signed: 10/25/2021 5:07:36 PM By: [name] MD FACS Signed: 10/25/2021 5:44:10 PM By: Baruch Gouty RN, BSN Entered By: Baruch Gouty on 10/25/2021 16:31:42 -------------------------------------------------------------------------------- Debridement Details Patient Name: Date of Service: [name], [name] 10/25/2021 3:30 PM Medical Record Number: [id number] Patient Account Number: [id number] Date of Birth/Sex: Treating RN:  28-Apr-1985 (36 y.o. [name] Primary Care Provider: Other Clinician: [name] Referring Provider: Treating Provider/Extender: [name] in Treatment: 104 Debridement Performed for Assessment: Wound #12 Left,Dorsal Foot Performed By: Physician [name], MD Debridement Type: Debridement Severity of Tissue Pre Debridement: Fat layer exposed Level of Consciousness (Pre-procedure): Awake and Alert Pre-procedure Verification/Time Out Yes - 16:27 Taken: Start Time: 16:27 T Area Debrided (L x W): otal 1.2 (cm) x 1.2 (cm) = 1.44 (cm) Tissue and other material debrided: Viable, Non-Viable, Slough, Subcutaneous, Slough Level: Skin/Subcutaneous Tissue Debridement Description: Excisional Instrument: Curette Bleeding: Minimum Hemostasis Achieved: Pressure Procedural Pain: 0 Post Procedural Pain: 0 Response to Treatment: Procedure was tolerated well Level of Consciousness (Post- Awake and Alert procedure): Post Debridement Measurements of Total Wound Length: (cm) 1.2 Width: (cm) 1.2 Depth: (cm) 0.1 Volume: (cm) 0.113 Character of Wound/Ulcer Post Debridement: Stable Severity of Tissue Post  Debridement: Fat layer exposed Post Procedure Diagnosis Same as Pre-procedure Electronic Signature(s) Signed: 10/25/2021 5:07:36 PM By: [name] MD FACS Signed: 10/25/2021 5:44:10 PM By: Baruch Gouty RN, BSN Entered By: Baruch Gouty on 10/25/2021 16:32:57 -------------------------------------------------------------------------------- Debridement Details Patient Name: Date of Service: [name], [name] 10/25/2021 3:30 PM Medical Record Number: [id number] Patient Account Number: [id number] Date of Birth/Sex: Treating RN: March 05, 1986 (36 y.o. [name] Primary Care Provider: [name] Other Clinician: Referring Provider: Treating Provider/Extender: [name] in Treatment: 104 Debridement Performed for Assessment: Wound #3 Left,Lateral,Plantar Foot Performed By: Physician [name], MD Debridement Type: Debridement Severity of Tissue Pre Debridement: Fat layer exposed Level of Consciousness (Pre-procedure): Awake and Alert Pre-procedure Verification/Time Out Yes - 16:27 Taken: Start Time: 16:27 T Area Debrided (L x W): otal 4.4 (cm) x 3.8 (cm) = 16.72 (cm) Tissue and other material debrided: Viable, Non-Viable, Callus, Slough, Subcutaneous, Slough Level: Skin/Subcutaneous Tissue Debridement Description: Excisional Instrument: Curette Bleeding: Minimum Hemostasis Achieved: Pressure Procedural Pain: 0 Post Procedural Pain: 0 Response to Treatment: Procedure was tolerated well Level of Consciousness (Post- Level of Consciousness (Post- Awake and Alert procedure): Post Debridement Measurements of Total Wound Length: (cm) 4.4 Width: (cm) 3.8 Depth: (cm) 2.1 Volume: (cm) 27.577 Character of Wound/Ulcer Post Debridement: Stable Severity of Tissue Post Debridement: Fat layer exposed Post Procedure Diagnosis Same as Pre-procedure Electronic Signature(s) Signed: 10/25/2021 5:07:36 PM By: [name] MD  FACS Signed: 10/25/2021 5:44:10 PM By: Baruch Gouty RN, BSN Entered By: Baruch Gouty on 10/25/2021 16:33:22 -------------------------------------------------------------------------------- HPI Details Patient Name: Date of Service: [name], [name] 10/25/2021 3:30 PM Medical Record Number: [id number] Patient Account Number: [id number] Date of Birth/Sex: Treating RN: 05-08-1985 (36 y.o. 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 He comes in for review of a wound over the plantar fifth metatarsal head extending into the lateral part of the foot. He was followed for this previously by his podiatrist Dr. Cornelius Moras. As the patient tells his story he went to see podiatry first for a swelling he developed on the lateral part of his fifth metatarsal head in May. He states this was "open" by podiatry and the area closed. He was followed up in June and it was again opened callus removed and it closed promptly. There were plans being made for surgery on the fifth metatarsal head in June  however his blood sugar was apparently too high for anesthesia. Apparently the area was debrided and opened again in June and it is never closed since. Looking over the records from podiatry I am really not able to follow this. It was clear when he was first seen it was before 5/14 at that point he already had a wound. By 5/17 the ulcer was resolved. I do not see anything about a procedure. On 5/28 noted to have pre-ulcerative moderate keratosis. X-ray noted 1/5 contracted toe and tailor's bunion and metatarsal deformity. On a visit date on 09/28/2018 the dorsal part of the left foot it healed and resolved. There was concern about swelling  in his lower extremity he was sent to the ER.. As far as I can tell he was seen in the ER on 7/12 with an ulcer on his left foot. A DVT rule out of the left leg was negative. I do not think I have complete records from podiatry but I am not able to verify the procedures this patient states he had. He states after the last procedure the wound has never closed although I am not able to follow this in the records I have from podiatry. He has not had a recent x-ray The patient has been using Neosporin on the wound. He is wearing a Darco shoe. He is still very active up on his foot working and exercising. Past medical history; type 2 diabetes ketosis-prone, leg swelling with a negative DVT study in July. Non-smoker ABI in our clinic was 0.85 on the left 10/16; substantial wound on the plantar left fifth met head extending laterally almost to the dorsal fifth MTP. We have been using silver alginate we gave him a Darco forefoot off loader. An x-ray did not show evidence of osteomyelitis did note soft tissue emphysema which I think was due to gas tracking through an open wound. There is no doubt in my mind he requires an MRI 10/23; MRI not booked until 3 November at the earliest this is largely due to his glucose sensor in the right arm. We have been using silver alginate. There has been an improvement 10/29; I am still not exactly sure when his MRI is booked for. He says it is the third but it is the 10th in epic. This definitely needs to be done. He is running a low-grade fever today but no other symptoms. No real improvement in the 1 02/26/2019 patient presents today for a follow-up visit here in our clinic he is last been seen in the clinic on October 29. Subsequently we were working on getting MRI to evaluate and see what exactly was going on and where we would need to go from the standpoint of whether or not he had osteomyelitis and again what treatments were going be required. Subsequently the patient  ended up being admitted to the hospital on 02/07/2019 and was discharged on 02/14/2019. This is a somewhat interesting admission with a discharge diagnosis of pneumonia due to COVID-19 although he was positive for COVID-19 when tested at the urgent care but negative x2 when he was actually in the hospital. With that being said he did have acute respiratory failure with hypoxia and it was noted he also have a left foot ulceration with osteomyelitis. With that being said he did require oxygen for his pneumonia and I level 4 L. He was placed on antivirals and steroids for the COVID-19. He was also transferred to the [facility] at one point. Nonetheless  he did subsequently discharged home and since being home has done much better in that regard. The CT angiogram did not show any pulmonary embolism. With regard to the osteomyelitis the patient was placed on vancomycin and Zosyn while in the hospital but has been changed to Augmentin at discharge. It was also recommended that he follow- up with wound care and podiatry. Podiatry however wanted him to see [name] according to the patient prior to them doing anything further. His hemoglobin A1c was 9.9 as noted in the hospital. Have an MRI of the left foot performed while in the hospital on 02/04/2019. 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 There is an overlying plantar open wound noted an abscess tracking back along the lateral aspect of the fifth metatarsal shaft. There is otherwise diffuse cellulitis and mild fasciitis without findings of polymyositis. The patient did have recently pneumonia secondary to COVID-19 I looked in the chart through epic and it does appear that the patient may need to have an additional x-ray just to ensure everything is cleared and that he has no airspace disease prior to putting him into the [name]. 03/05/2019; patient was  readmitted to the clinic last week. He was hospitalized twice for a viral upper respiratory tract infection from 11/1 through 11/4 and then 11/5 through 11/12 ultimately this turned out to be Covid pneumonitis. Although he was discharged on oxygen he is not using it. He says he feels fine. He has no exercise limitation no cough no sputum. His O2 sat in our clinic today was 100% on room air. 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 He received Vanco and Zosyn in the hospital and then was discharged on 2 weeks of Augmentin. I do not see any relevant cultures. He was supposed to follow-up with infectious disease but I do not see that he has an appointment. 12/8; patient saw Dr. Novella Olive of infectious disease last week. He felt that he had had adequate antibiotic therapy. He did not go to follow-up with Dr. Amalia Hailey of podiatry and I have again talked to him about the pros and cons of this. He does not want to consider a ray amputation of this time. He is aware of the risks of recurrence, migration etc. He started HBO today and tolerated this well. He can complete the Augmentin that I gave him last week. I have looked over the lab work that Dr. Chana Bode ordered his C-reactive protein was 3.3 and his sedimentation rate was 17. The C-reactive protein is never really been measurably that high in this patient 12/15; not much change in the wound today however he has undermining along the lateral part of the foot again more extensively than last week. He has some rims of epithelialization. We have been using silver alginate. He is undergoing hyperbarics but did not dive today 12/18; in for his obligatory first total contact cast change. Unfortunately there was pus coming from the undermining area around his fifth metatarsal head. This was cultured but will preclude reapplication of a cast. He is seen  in conjunction with HBO 12/24; patient had staph lugdunensis in the wound in the undermining area laterally last time. We put him on doxycycline which should have covered this. The wound looks better today. I am going to give him another week of doxycycline before  reattempting the total contact cast 12/31; the patient is completing antibiotics. Hemorrhagic debris in the distal part of the wound with some undermining distally. He also had hyper granulation. Extensive debridement with a #5 curette. The infected area that was on the lateral part of the fifth met head is closed over. I do not think he needs any more antibiotics. Patient was seen prior to HBO. Preparations for a total contact cast were made in the cast will be placed post hyperbarics 04/11/19; once again the patient arrives today without complaint. He had been in a cast all week noted that he had heavy drainage this week. This resulted in large raised areas of macerated tissue around the wound 1/14; wound bed looks better slightly smaller. Hydrofera Blue has been changing himself. He had a heavy drainage last week which caused a lot of maceration around the wound so I took him out of a total contact cast he says the drainage is actually better this week He is seen today in conjunction with HBO 1/21; returns to clinic. He was up in [place] for a day or 2 attending a funeral. He comes back in with the wound larger and with a large area of exposed bone. He had osteomyelitis and septic arthritis of the fifth left metatarsal head while he was in hospital. He received IV antibiotics in the hospital for a prolonged period of time then 3 weeks of Augmentin. Subsequently I gave him 2 weeks of doxycycline for more superficial wound infection. When I saw this last week the wound was smaller the surface of the wound looks satisfactory. 1/28; patient missed hyperbarics today. Bone biopsy I did last time showed Enterococcus faecalis and Staphylococcus  lugdunensis . He has a wide area of exposed bone. We are going to use silver alginate as of today. I had another ethical discussion with the patient. 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 In this situation I think the likelihood of healing this is low. Therefore I have recommended a ray amputation and with the patient's agreement I have referred him to Dr. Doran Durand. The other issue is that his compliance with hyperbarics has been minimal because of his work schedule and given his underlying decision I am going to stop this today READMISSION 10/24/2019 MRI 09/29/2019 left foot IMPRESSION: 1. Apparent skin ulceration inferior and lateral to the 5th metatarsal base with underlying heterogeneous T2 signal and enhancement in the subcutaneous fat. Small peripherally enhancing fluid collections along the plantar and lateral aspects of the 5th metatarsal base suspicious for abscesses. 2. Interval amputation through the mid 5th metatarsal with nonspecific low-level marrow edema and enhancement. Given the proximity to the adjacent soft tissue inflammatory changes, osteomyelitis cannot be excluded. 3. The additional bones appear unremarkable. MRI 09/29/2019 right foot IMPRESSION: 1. Soft tissue ulceration lateral to the 5th MTP joint. There is low-level T2 hyperintensity within the 4th and 5th metatarsal heads and adjacent proximal phalanges without abnormal T1 signal or cortical destruction. These findings are nonspecific and could be seen with early marrow edema, hyperemia or early osteomyelitis. No evidence of septic joint. 2. Mild tenosynovitis and synovial enhancement associated with the extensor digitorum tendons at the level of the midfoot. 3. Diffuse low-level muscular T2 hyperintensity and enhancement, most consistent with diabetic myopathy. LEFT FOOT BONE Methicillin resistant staphylococcus aureus Staphylococcus lugdunensis MIC MIC CIPROFLOXACIN  >=8 RESISTANT Resistant <=0.5 SENSI... Sensitive CLINDAMYCIN <=0.25 SENS... Sensitive >=8 RESISTANT Resistant ERYTHROMYCIN >=8 RESISTANT Resistant >=8 RESISTANT Resistant GENTAMICIN <=0.5 SENSI.Marland KitchenMarland Kitchen  Sensitive <=0.5 SENSI... Sensitive Inducible Clindamycin NEGATIVE Sensitive NEGATIVE Sensitive OXACILLIN >=4 RESISTANT Resistant 2 SENSITIVE Sensitive RIFAMPIN <=0.5 SENSI... Sensitive <=0.5 SENSI... Sensitive TETRACYCLINE <=1 SENSITIVE Sensitive <=1 SENSITIVE Sensitive TRIMETH/SULFA <=10 SENSIT Sensitive <=10 SENSIT Sensitive ... Marland Kitchen.. VANCOMYCIN 1 SENSITIVE Sensitive <=0.5 SENSI... Sensitive Right foot bone . Component 3 wk ago Specimen Description BONE Special Requests RIGHT 4 METATARSAL SAMPLE B Gram Stain NO WBC SEEN NO ORGANISMS SEEN Culture RARE METHICILLIN RESISTANT STAPHYLOCOCCUS AUREUS NO ANAEROBES ISOLATED Performed at [facility], [place] [address], [place], [place] [address] Report Status 10/08/2019 FINAL Organism ID, Bacteria METHICILLIN RESISTANT STAPHYLOCOCCUS AUREUS Resulting Agency [facility] Susceptibility Methicillin resistant staphylococcus aureus MIC CIPROFLOXACIN >=8 RESISTANT Resistant CLINDAMYCIN <=0.25 SENS... Sensitive ERYTHROMYCIN >=8 RESISTANT Resistant GENTAMICIN <=0.5 SENSI... Sensitive Inducible Clindamycin NEGATIVE Sensitive OXACILLIN >=4 RESISTANT Resistant RIFAMPIN <=0.5 SENSI... Sensitive TETRACYCLINE <=1 SENSITIVE Sensitive TRIMETH/SULFA <=10 SENSIT Sensitive ... VANCOMYCIN 1 SENSITIVE Sensitive This is a patient we had in clinic earlier this year with a wound over his left fifth metatarsal head. 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 Doran Durand on 05/16/2019. He  seems to have developed problems with wounds on his bilateral feet in June 2021 although he may have had problems earlier than this. He was in an urgent care with a right foot ulcer on 09/26/2019 and given a course of doxycycline. This was apparently after having trouble getting into see orthopedics. He was seen by podiatry on 09/28/2019 noted to have bilateral lower extremity ulcers including the left lateral fifth metatarsal base and the right subfifth met head. It was noted that had purulent drainage at that time. He required hospitalization from 6/20 through 7/2. This was because of worsening right foot wounds. He underwent bilateral operative incision and drainage and bone biopsies bilaterally. Culture results are listed above. He has been referred back to clinic by Dr. Jacqualyn Posey of podiatry. He is also followed by Dr. Megan Salon who saw him yesterday. He was discharged from hospital on Zyvox Flagyl and Levaquin and yesterday changed to doxycycline Flagyl and Levaquin. His inflammatory markers on 6/26 showed a sedimentation rate of 129 and a C-reactive protein of 5. This is improved to 14 and 1.3 respectively. This would indicate improvement. ABIs in our clinic today were 1.23 on the right and 1.20 on the left 11/01/2019 on evaluation today patient appears to be doing fairly well in regard to the wounds on his feet at this point. Fortunately there is no signs of active infection at this time. No fevers, chills, nausea, vomiting, or diarrhea. He currently is seeing infectious disease and still under their care at this point. Subsequently he also has both wounds which she has not been using collagen on as he did not receive that in his packaging he did not call us and let us know that. Apparently that just was missed on the order. Nonetheless we will get that straightened out today. 8/9-Patient returns for bilateral foot wounds, using Prisma with hydrogel moistened dressings, and the wounds appear stable.  Patient using surgical shoes, avoiding much pressure or weightbearing as much as possible 8/16; patient has bilateral foot wounds. 1 on the right lateral foot proximally the other is on the left mid lateral foot. Both required debridement of callus  and thick skin around the wounds. We have been using silver collagen 8/27; patient has bilateral lateral foot wounds. The area on the left substantially surrounded by callus and dry skin. This was removed from the wound edge. The underlying wound is small. The area on the right measured somewhat smaller today. 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 Unfortunately I did not update his antibiotics during today's visit. 9/10 I reviewed Dr. Hale Bogus last notes he felt he had completed antibiotics his inflammatory markers were reasonably well controlled. He has a small wound on the lateral left foot and a tiny area on the right which is just above closed. He is using Hydrofera Blue with border foam he has bilateral surgical shoes 9/24; 2 week f/u. doing well. right foot is closed. left foot still undermined. 10/14; right foot remains closed at the fifth met head. The area over the base of the left fifth metatarsal has a small open area but considerable undermining towards the plantar foot. Thick callus skin around this suggests an adequate pressure relief. We have talked about this. He says he is going to go back into his cam boot. I suggested a total contact cast he did not seem enamored with this suggestion 10/26; left foot base of the fifth metatarsal. Same condition as last time. He has skin over the area with an open wound however the skin is not adherent. He went to see Dr. Earleen Newport who did an x-ray and culture of his foot I have not reviewed the x-ray but the patient was not told anything. He is on doxycycline 11/11; since the patient was last here he was in the emergency room on 10/30  he was concerned about swelling in the left foot. They did not do any cultures or x-rays. They changed his antibiotics to cephalexin. Previous culture showed group B strep. The cephalexin is appropriate as doxycycline has less than predictable coverage. Arrives in clinic today with swelling over this area under the wound. He also has a new wound on the right fifth metatarsal head 11/18; the patient has a difficult wound on the lateral aspect of the left fifth metatarsal head. The wound was almost ballotable last week I opened it slightly expecting to see purulence however there was just bleeding. I cultured this this was negative. X-ray unchanged. We are trying to get an MRI but I am not sure were going to be able to get this through his insurance. He also has an area on the right lateral fifth metatarsal head this looks healthier 12/3; the patient finally got our MRI. Surprisingly this did not show osteomyelitis. I did show the soft tissue ulceration at the lateral plantar aspect of the fifth metatarsal base with a tiny residual 6 mm abscess overlying the superficial fascia I have tried to culture this area I have not been able to get this to grow anything. Nevertheless the protruding tissue looks aggravated. I suspect we should try to treat the underlying "abscess with broad-spectrum antibiotics. I am going to start him on Levaquin and Flagyl. He has much less edema in his legs and I am going to continue to wrap his legs and see him weekly 12/10. I started Levaquin and Flagyl on him last week. He just picked up the Flagyl apparently there was some delay. The worry is the wound on the left fifth metatarsal base which is substantial and worsening. His foot looks like he inverts at the  ankle making this a weightbearing surface. Certainly no improvement in fact I think the measurements of this are somewhat worse. We have been using 12/17; he apparently just got the Levaquin yesterday this is 2 weeks  after the fact. He has completed the Flagyl. The area over the left fifth metatarsal base still has protruding granulation tissue although it does not look quite as bad as it did some weeks ago. He has severe bilateral lymphedema although we have not been treating him for wounds on his legs this is definitely going to require compression. There was so much edema in the left I did not wish to put him in a total contact cast today. I am going to increase his compression from 3-4 layer. The area on the right lateral fifth met head actually look quite good and superficial. 12/23; patient arrived with callus on the right fifth met head and the substantial hyper granulated callused wound on the base of his fifth metatarsal. He says he is completing his Levaquin in 2 days but I do not think that adds up with what I gave him but I will have to double check this. We are using Hydrofera Blue on both areas. My plan is to put the left leg in a cast the week after New Year's 04/06/2020; patient's wounds about the same. Right lateral fifth metatarsal head and left lateral foot over the base of the fifth metatarsal. There is undermining on the left lateral foot which I removed before application of total contact cast continuing with Hydrofera Blue new. Patient tells me he was seen by endocrinology today lab work was done [Dr. Kerr]. Also wondering whether he was referred to cardiology. I went over some lab work from previously does not have chronic renal failure certainly not nephrotic range proteinuria he does have very poorly controlled diabetes but this is not his most updated lab work. Hemoglobin A1c has been over 11 1/10; the patient had a considerable amount of leakage towards mid part of his left foot with macerated skin however the wound surface looks better the area on the right lateral fifth met head is better as well. I am going to change the dressing on the left foot under the total contact cast to silver  alginate, continue with Hydrofera Blue on the right. 1/20; patient was in the total contact cast for 10 days. Considerable amount of drainage although the skin around the wound does not look too bad on the left foot. The area on the right fifth metatarsal head is closed. Our nursing staff reports large amount of drainage out of the left lateral foot wound 1/25; continues with copious amounts of drainage described by our intake staff. PCR culture I did last week showed E. coli and Enterococcus faecalis and low quantities. Multiple resistance genes documented including extended spectrum beta lactamase, MRSA, MRSE, quinolone, tetracycline. The wound is not quite as good this week as it was 5 days ago but about the same size 2/3; continues with copious amounts of malodorous drainage per our intake nurse. The PCR culture I did 2 weeks ago showed E. coli and low quantities of Enterococcus. There were multiple resistance genes detected. I put Neosporin on him last week although this does not seem to have helped. The wound is slightly deeper today. Offloading continues to be an issue here although with the amount of drainage she has a total contact cast is just not going to work 2/10; moderate amount of drainage. Patient reports he cannot get  his stocking on over the dressing. I told him we have to do that the nurse gave him suggestions on how to make this work. The wound is on the bottom and lateral part of his left foot. Is cultured predominantly grew low amounts of Enterococcus, E. coli and anaerobes. There were multiple resistance genes detected including extended spectrum beta lactamase, quinolone, tetracycline. I could not think of an easy oral combination to address this so for now I am going to do topical antibiotics provided by [facility] I think the main agents here are vancomycin and an aminoglycoside. We have to be able to give him access to the wounds to get the topical antibiotic on 2/17; moderate  amount of drainage this is unchanged. He has his Keystone topical antibiotic against the deep tissue culture organisms. He has been using this and changing the dressing daily. Silver alginate on the wound surface. 2/24; using Keystone antibiotic with silver alginate on the top. He had too much drainage for a total contact cast at one point although I think that is improving and I think in the next week or 2 it might be possible to replace a total contact cast I did not do this today. In general the wound surface looks healthy however he continues to have thick rims of skin and subcutaneous tissue around the wide area of the circumference which I debrided 06/04/2020 upon evaluation today patient appears to be doing well in regard to his wound. I do feel like he is showing signs of improvement. There is little bit of callus and dead tissue around the edges of the wound as well as what appears to be a little bit of a sinus tract that is off to the side laterally I would perform debridement to clear that away today. 3/17; left lateral foot. The wound looks about the same as I remember. Not much depth surface looks healthy. No evidence of infection 3/25; left lateral foot. Wound surface looks about the same. Separating epithelium from the circumference. There really is no evidence of infection here however not making progress by my view 3/29; left lateral foot. Surface of the wound again looks reasonably healthy still thick skin and subcutaneous tissue around the wound margins. There is no evidence of infection. One of the concerns being brought up by the nurses has again the amount of drainage vis--vis continued use of a total contact cast 4/5; left lateral foot at roughly the base of the fifth metatarsal. Nice healthy looking granulated tissue with rims of epithelialization. The overall wound measurements are not any better but the tissue looks healthy. The only concern is the amount of drainage although  he has no surrounding maceration with what we have been doing recently to absorb fluid and protect his skin. He also has lymphedema. 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 He states that the kerlix/coban under the TCC rolled down and could not get it back up. He has been using an offloading scooter and has somehow hurt his right foot using this device. This happened last week. He states that the side of his right foot developed a blister and opened. The top of his foot also has a few small open wounds he thinks is due  to his socks rubbing in his shoes. He has not been using any dressings to the wound. He denies purulent drainage, fever/chills or erythema to the wounds. 4/22; patient presents for 1 week follow-up. He developed new wounds to the right foot that were evaluated at last clinic visit. He continues to have a total contact cast to the left leg and he reports no issues. He has been using silver collagen to the right foot wounds with no issues. He denies purulent drainage, fever/chills or erythema to the right foot wounds. He has no complaints today 4/25; patient presents for 1 week follow-up. He has a total contact cast of the left leg and reports no issues. He has been using silver alginate to the right foot wound. He denies purulent drainage, fever/chills or erythema to the right foot wounds. 5/2 patient presents for 1 week follow-up. T contact cast on the left. The wound which is on the base of the plantar foot at the base of the fifth metatarsal otal actually looks quite good and dimensions continue to gradually contract. HOWEVER the area on the right lateral fifth metatarsal head is much larger than what I remember from 2 weeks ago. Once  more is he has significant levels of hypergranulation. Noteworthy that he had this same hyper granulated response on his wound on the left foot at one point in time. So much so that he I thought there was an underlying fluid collection. Based on this I think this just needs debridement. 5/9; the wound on the left actually continues to be gradually smaller with a healthy surface. Slight amount of drainage and maceration of the skin around but not too bad. However he has a large wound over the right fifth metatarsal head very much in the same configuration as his left foot wound was initially. I used silver nitrate to address the hyper granulated tissue no mechanical debridement 5/16; area on the left foot did not look as healthy this week deeper thick surrounding macerated skin and subcutaneous tissue. The area on the right foot fifth met head was about the same The area on the right ankle that we identified last week is completely broken down into an open wound presumably a stocking rubbing issue 5/23; patient has been using a total contact cast to the left side. He has been using silver alginate underneath. He has also been using silver alginate to the right foot wounds. He has no complaints today. He denies any signs of infection. 5/31; the left-sided wound looks some better measure smaller surface granulation looks better. We have been using silver alginate under the total contact cast The large area on his right fifth met head and right dorsal foot look about the same still using silver alginate 6/6; neither side is good as I was hoping although the surface area dimensions are better. A lot of maceration on his left and right foot around the wound edge. Area on the dorsal right foot looks better. He says he was traveling. I am not sure what does the amount of maceration around the plantar wounds may be drainage issues 6/13; in general the wound surfaces look quite good on both sides. Macerated  skin and raised edges around the wound required debridement although in general especially on the left the surface area seems improved. The area on the right dorsal ankle is about the same I thought this would not be such a problem to close 6/20; not much change in either wound although the one on  the right looks a little better. Both wounds have thick macerated edges to the skin requiring debridements. We have been using silver alginate. The area on the dorsal right ankle is still open I thought this would be closed. 6/28; patient comes in today with a marked deterioration in the right foot wound fifth met head. Wide area of exposed bone this is a drastic change from last time. The area on the left there we have been casting is stagnant. We have been using silver alginate in both wound areas. 7/5; bone culture I did for PCR last time was positive for Pseudomonas, group B strep, Enterococcus and Staph aureus. There was no suggestion of methicillin resistance or ampicillin resistant genes. This was resistant to tetracycline however He comes into the clinic today with the area over his right plantar fifth metatarsal head which had been doing so well 2 weeks ago completely necrotic feeling bone. I do not know that this is going to be salvageable. The left foot wound is certainly no smaller but it has a better surface and is superficial. 7/8; patient called in this morning to say that his total contact cast was rubbing against his foot. He states he is doing fine overall. He denies signs of infection. 7/12; continued deterioration in the wound over the right fifth metatarsal head crumbling bone. This is not going to be salvageable. The patient agrees and wants to be referred to Dr. Doran Durand which we will attempt to arrange as soon as possible. I am going to continue him on antibiotics as long as that takes so I will renew those today. The area on the left foot which is the base of the fifth metatarsal  continues to look somewhat better. Healthy looking tissue no depth no debridement is necessary here. 7/20; the patient was kindly seen by Dr. Doran Durand of orthopedics on 10/19/2020. 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 Towards this end we have taken him out of the total contact cast on the left we will put him in a wrap with Hydrofera Blue. As I understand things surgery is planned for 7/21 7/27; patient had his surgery last Thursday. 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 Apparently everything went well we did not still disturb that today The area on the left foot actually looks quite good. He has been much less mobile which probably explains this he did not seem to do well in the total contact cast secondary to drainage and maceration I think. 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 Fortunately there is no evidence of active infection at this time. No fevers, chills, nausea, vomiting, or diarrhea. Overall I think that he is actually doing extremely well. Nonetheless I do believe that he is staying off of this more following the surgery in his right foot that is the reason the left is doing so great. 8/16; left plantar foot wound. This looks smaller than the last time I saw this he is using Hydrofera Blue. The surgical wound on the right foot is being followed by Dr. Doran Durand we did not look at this today. He has surgical shoes on both feet 8/23; left plantar foot wound not as good this week. Surrounding macerated skin and subcutaneous tissue everything looks moist and wet. I do  not think he is offloading this adequately. He is using a surgical shoe Apparently the right foot surgical wound is not open although I did not check his foot 8/31; left plantar foot lateral aspect. Much improved this  week. He has no maceration. Some improvement in the surface area of the wound but most impressively the depth is come in we are using silver alginate. The patient is a Product/process development scientist. He is asked that we write him a letter so he can go back to work. I have also tried to see if we can write something that will allow him to limit the amount of time that he is on his foot at work. 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 Hopefully the latter can be avoided 9/6; I believe the patient missed an appointment last week. He arrives in today with a wound looking roughly the same certainly no better. Undermining laterally and also inferiorly. We used molecuLight today in training with the patient's permission.. We are using silver alginate 9/21 wound is measuring bigger this week although this may have to do with the aggressive circumferential debridement last week in response to the blush fluorescence on the Molecu[employer]. Culture I did last week showed significant MSSA and E. coli. I put him on Augmentin but he has not started it yet. We are also going to send this for compounded antibiotics at [facility]. There is no evidence of systemic infection 9/29; silver alginate. His Keystone arrived. He is completing Augmentin in 2 days. Offloading in a cam boot. Moderate drainage per our intake staff 10/5; using silver alginate. He has been using his [place]. He has completed his Augmentin. Per our intake nurse still a lot of drainage, far too much to consider a total contact cast. Wound measures about the same. He had the same undermining area that I defined last week from a roughly 11-3. I remove this today 10/12; using silver alginate he is using the [place]. He comes in for a nurse visit hence we are applying [employer] twice a week. Measuring slightly better today and less notable drainage. Extensive debridement of the wound  edge last time 10/18; using topical Keystone and silver alginate and a soft cast. Wound measurements about the same. Drainage was through his soft cast. We are changing this twice a week Tuesdays and Friday 10/25; comes in with moderate drainage. Still using Keystone silver alginate and a soft cast. Wound dimensions completely the same.He has a lot of edema in the left leg he has lymphedema. Asking for [name] to consider wrapping him as he cannot get his stocking on over the soft cast 11/2; comes in with moderate to large drainage slightly smaller in terms of width we have been using [place]. His wound looks satisfactory but not much improvement 11/4; patient presents today for obligatory cast change. Has no issues or complaints today. He denies signs of infection. 11/9; patient traveled this weekend to [place], was on the cast quite a bit. Staining of the cast with black material from his walking boot. Drainage was not quite as bad as we feared. Using silver alginate and Keystone 11/16; we do not have size for cast therefore we have been putting a soft cast on him since the change on Friday. Still a significant amount of drainage necessitating changing twice a week. We have been using the [facility] at cast changes either hard or soft as well  as silver alginate Comes in the clinic with things actually looking fairly good improvement in width. He says his offloading is about the same 02/24/2021 upon evaluation today patient actually comes back in and is doing excellent in regard to his foot ulcer this is significantly smaller even compared to the last visit. The soft cast seems to have done extremely well for him which is great news. I do not see any signs of infection minimal debridement will be needed today. 11/30; left lateral foot much improved half a centimeter improvement in surface area. No evidence of infection. He seems to be doing better with the soft cast in the TCC therefore we will continue with  this. He comes back in later in the week for a change with the nurses. This is due to drainage 12/6; no improvement in dimensions. Under illumination some debris on the surface we have been using silver alginate, soft cast. If there is anything optimistic here he seems to have have less drainage 12/13. Dimensions are improved both length and width and slightly in depth. Appears to be quite healthy today. Raised edges of this thick skin and callus around the edges however. He is in a soft cast were bringing him back once for a change on Friday. Drainage is better 12/20. Dimensions are improved. He still has raised edges of thick skin and callus around the edges. We are using a soft cast 12/28; comes in today with thick callus around the wound. Using silver under alginate under a soft cast. I do not think there is much improvement in any measurement 2023 04/06/2021; patient was put in a total contact cast. Unfortunately not much change in surface area 1/10; not much different still thick callus and skin around the edge in spite of the total contact cast. This was just debrided last week we have been using the [facility] compounded antibiotic and silver alginate under a total contact cast 1/18 the patient's wound on the left side is doing nicely. smaller HOWEVER he comes in today with a wound on the right foot laterally. blister most likely serosangquenous drainage 1/24; the patient continues to do well in terms of the plantar left foot which is continued to contract using silver alginate under the total contact cast HOWEVER the right lateral foot is bigger with denuded skin around the edges. I used pickups and a #15 scalpel to remove this this looks like the remanence of a large blister. Cannot rule out infection. Culture in this area I did last week showed Staphylococcus lugdunensis few colonies. I am going to try to address this with his [employer] antibiotic that is done so well on the left having  linezolid and this should cover the staph 2/1; the patient's wound on his left foot which was the original plantar foot wound thick skin and eschar around the edges even in the total contact cast but the wound surface does not look too bad The real problem is on how his right lateral foot at roughly the base of the fifth metatarsal. The wound is completely necrotic more worrisome than that there is swelling around the edges of this. We have been using silver alginate on both wounds and Keystone on the right foot. Unfortunately I think he is going to require systemic antibiotics while we await cultures. He did not get the x-ray done that we ordered last week [lost the prescription 2/7; disappointingly in the area on the left foot which we are treating with a total contact cast is  still not closed although it is much smaller. He continues to have a lot of callus around the wound edge. -Right lateral foot culture I did last week was negative x-ray also negative for osteomyelitis. 2/15: TCC silver alginate on the left and silver alginate on the right lateral. No real improvement in either area 05/26/2021: T oday, the wounds are roughly the same size as at his previous visit, post-debridement. He continues to endorse fairly substantial drainage, particularly on the right. He has been in a total contact cast on the left. There is still some callus surrounding this lesion. On the right, the periwound skin is quite macerated, along with surrounding callus. The center of the right-sided wound also has some dark, densely adherent material, which is very difficult to remove. 06/02/2021: Today, both wounds are slightly smaller. He has been using zinc oxide ointment around the right ulcer and the degree of maceration has improved markedly. There continues to be an area of nonviable tissue in the center of the right sided ulcer. The left-sided wound, which has been in the total contact cast. Appears clean and the  degree of callus around it is less than previously. 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 He had to take the stairs and both wounds have increased in size. The left foot, which has been in a total contact cast, has developed a tunnel tracking to the lateral aspect of his foot. The nonviable tissue in the center of the right-sided ulcer remains recalcitrant to debridement. There is significant undermining surrounding the entirety of the left sided wound. 06/16/2021: The elevator at school has been fixed and the patient has been able to avoid putting as much weight on his wounds over the past week. We converted the left foot wound into a single lesion today, but despite this, the wound is actually smaller. The base is healthy with limited periwound callus. On the right, the central necrotic area is still present. He continues to be quite macerated around the right sided wound, despite applying barrier cream. This does, however, have the benefit of softening the callus to make it more easily removable. 06/23/2021: Today, the left wound is smaller. The lateral aspect that had opened up previously is now closed. The wound base has a healthy bed of granulation tissue and minimal slough. Unfortunately, on the right, the wound is larger and continues to be fairly macerated. He has also reopened the wound at his right ankle. He thinks this is due to the gait he has adopted secondary to his total contact cast and boot. 06/30/2021: T oday, both wounds are a little bit larger. The lateral aspect on the left has remained closed. He continues to have significant periwound maceration. The culture that I took from the right sided wound grew a population of bacteria that is not covered by his current [facility] antibiotic. The center of the right- sided wound continues to appear necrotic with nonviable fat. It probes deeper today, but does not reach  bone. 07/07/2021: The periwound maceration is a little bit less today. The right lateral foot wound has some areas that appear more viable and the necrotic center also looks a little bit better. The wound on the dorsal surface of his right foot near the ankle is contracting and the surface appears healthy. The left plantar wound surface looks healthy, but there is some new undermining on the medial portion. He did get his new Keystone antibiotic and  began applying that to the right foot wound on Saturday. 07/14/2021: The intake nurse reported substantial drainage from his wounds, but the periwound skin actually looks better than is typical for him. The wound on the dorsal surface of his right foot near the ankle is smaller and just has a small open area underneath some dried eschar. The left plantar wound surface looks healthy and there has been no significant accumulation of callus. The right lateral foot wound looks quite a bit better, with the central portion, which typically appears necrotic, looking more viable albeit pale. 07/22/2021: His left foot is extremely macerated today. The wound is about the same size. The wound on the dorsal surface of his right foot near the ankle had closed, but he traumatized it removing the dressing and there is a tiny skin tear in that location. The right lateral foot wound is bigger, but the surface appears healthy. 07/30/2021: The wound on the dorsal surface of his right foot near the ankle is closed. The right lateral foot wound again is a little bit bigger due to some undermining. The periwound skin is in better condition, however. He has been applying zinc oxide. The wound surface is a little bit dry today. On the left, he does not have the substantial maceration that we frequently see. The wound itself is smaller and has a clean surface. 08/06/2021: Both wounds seem to have deteriorated over the past week. The right lateral foot wound has a dry surface but the  periwound is boggy.. Overall wound dimensions are about the same. On the left, the wound is about the same size, but there is more undermining present underneath periwound callus. 08/13/2021: The right sided wound looks about the same, but on the left there has been substantial deterioration. The undermining continues to extend under periwound callus. Once this was removed, substantial extension of the wound was present. There is no odor or purulent drainage but clearly the wounds have broken down. 08/20/2021: The wounds look about the same today. He has been out of his total contact cast and has just been changing the dressings using topical Keystone with PolyMem Ag, Kerlix and Ace bandages. The wound on the top of his right ankle has reopened but this is quite small. There was a little bit of purulent material that I expressed when examining this wound. 08/24/2021: After the aggressive debridement I performed at his last visit, the wounds actually look a little bit better today. They are smaller with the exception of the wound on the top of his right ankle which is a little bit bigger as some more skin pulled off when he was changing his dressing. We are using topical Keystone with PolyMem Ag Kerlix and Ace bandages. 09/02/2021: There has been really no change to any of his wounds. 09/16/2021: The patient was hospitalized last week with nausea, vomiting, and dehydration. He says that while he was in the hospital, his wounds were not really addressed properly. T oday, both plantar foot wounds are larger and the periwound skin is macerated. The wound on the dorsum of his right foot has a scab on the top. The right foot now has a crater where previously he had had nonviable fat. It looks as though this simply died and fell out. The periwound callus is wet. 09/24/2021: His wounds have deteriorated somewhat since his last visit. The wound on the dorsum of his right foot near his ankle is larger and has  more nonviable tissue present. The crater in  his right foot is even deeper; I cannot quite palpate or probe to bone but I am sure it is close. The wound on his left plantar foot has an odd boggy area in the center that almost feels as though it has fluid within it. He has run out of his topical Keystone antibiotic. We are using silver alginate on his wounds. 09/29/2021: He has developed a new wound on the dorsum of his left foot near his ankle. He says he thinks his wrapping is rubbing in that site. I would concur with this as the wound on his right ankle is larger. The left foot looks about the same. The right foot has the crater that was present last week. No significant slough accumulation, but his foot remains quite swollen and warm despite oral antibiotic therapy. 10/08/2021: All of his wounds look about the same as last week. He did not start his oral antibiotics that are prescribed until just a couple of days ago; his [employer] compounded antibiotics formula has been changed and he is awaiting delivery of the new recipe. His MRI that was scheduled for earlier this week was canceled as no prior authorization had been obtained; unfortunately the tech responsible sent an email to my old [employer] email, which I no longer use nor have access to. 10/18/2021: The wounds on his bilateral dorsal feet near the ankles are both improved. They are smaller and have just some eschar and slough buildup. The left plantar wound has a fair amount of undermining, but the surface is clean. There is some periwound callus accumulation. On the right plantar foot, there is nonviable fat leading to a deep tunnel that tracks towards his dorsal medial foot. There is periwound callus and slough accumulation, as well. His right foot and leg remain swollen as compared to the left. 10/25/2021: The wounds on his bilateral dorsal feet and at the ankles have broken down somewhat. They are little bit larger than last week. The left  plantar wound continues to undermine laterally but the surface is clean. The right plantar foot wound shows some decreased depth in the tunnel tracking towards his dorsal medial foot. He has not yet had the Doppler study that I ordered; it sounds like there is some confusion about the scheduling of the procedure. In addition, the MRI was denied and I have taken steps to appeal the denial. Electronic Signature(s) Signed: 10/25/2021 5:03:21 PM By: [name] MD FACS Entered By: [name] on 10/25/2021 17:03:21 -------------------------------------------------------------------------------- Physical Exam Details Patient Name: Date of Service: [name], [name] 10/25/2021 3:30 PM Medical Record Number: [id number] Patient Account Number: [id number] Date of Birth/Sex: Treating RN: 1985-11-14 (36 y.o. [name] Primary Care Provider: [name] Other Clinician: Referring Provider: Treating Provider/Extender: [name] in Treatment: 104 Constitutional . Slightly tachycardic, asymptomatic. . . No acute distress.Marland Kitchen Respiratory Normal work of breathing on room air.. Notes 10/25/2021: The wounds on his bilateral dorsal feet and at the ankles have broken down somewhat. They are little bit larger than last week. The left plantar wound continues to undermine laterally but the surface is clean. The right plantar foot wound shows some decreased depth in the tunnel tracking towards his dorsal medial foot. Electronic Signature(s) Signed: 10/25/2021 5:03:51 PM By: [name] MD FACS Entered By: [name] on 10/25/2021 17:03:51 -------------------------------------------------------------------------------- Physician Orders Details Patient Name: Date of Service: [name], [name] 10/25/2021 3:30 PM Medical Record Number: [id number] Patient Account Number: [id number] Date of  Birth/Sex: Treating RN: Dec 26, 1985 (36 y.o. 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 - Dr. [name] - Room 1 with [facility] Return A Tues 11/02/21 @ 13:15 Bathing/ Shower/ Hygiene May shower and wash wound with soap and water. Edema Control - Lymphedema / SCD / Other Bilateral Lower Extremities Elevate legs to the level of the heart or above for 30 minutes daily and/or when sitting, a frequency of: - throughout the day Avoid standing for long periods of time. Exercise regularly Compression stocking or Garment 20-30 mm/Hg pressure to: - to both legs daily Off-Loading Open toe surgical shoe to: - right foot Additional Orders / Instructions Follow Nutritious Diet Wound Treatment Wound #11 - Foot Wound Laterality: Right, Anterior Cleanser: Soap and Water 1 x Per Day/30 Days Discharge Instructions: May shower and wash wound with dial antibacterial soap and water prior to dressing change. Cleanser: Byram Ancillary Kit - 15 Day Supply (Generic) 1 x Per Day/30 Days Discharge Instructions: Use supplies as instructed; Kit contains: (15) Saline Bullets; (15) 3x3 Gauze; 15 pr Gloves Topical: keystone antibiotic compound 1 x Per Day/30 Days Prim Dressing: Hydrofera Blue Classic Foam, 4x4 in 1 x Per Day/30 Days ary Discharge Instructions: Moisten with saline prior to applying to wound bed Secondary Dressing: NonWoven  Sponge, 4x4 (in/in) (Generic) 1 x Per Day/30 Days Secured With: 42M Medipore H Soft Cloth Surgical T ape, 4 x 10 (in/yd) (Generic) 1 x Per Day/30 Days Discharge Instructions: Secure with tape as directed. Wound #12 - Foot Wound Laterality: Dorsal, Left Cleanser: Soap and Water 1 x Per Day/30 Days Discharge Instructions: May shower and wash wound with dial antibacterial soap and water prior to dressing change. Cleanser: Byram Ancillary Kit - 15 Day Supply (Generic) 1 x Per Day/30 Days Discharge Instructions: Use supplies as instructed; Kit contains: (15) Saline Bullets; (15) 3x3 Gauze; 15 pr Gloves Topical: keystone antibiotic compound 1 x Per Day/30 Days Prim Dressing: Hydrofera Blue Classic Foam, 4x4 in 1 x Per Day/30 Days ary Discharge Instructions: Moisten with saline prior to applying to wound bed Secondary Dressing: NonWoven Sponge, 4x4 (in/in) (Generic) 1 x Per Day/30 Days Secured With: 42M Medipore H Soft Cloth Surgical T ape, 4 x 10 (in/yd) (Generic) 1 x Per Day/30 Days Discharge Instructions: Secure with tape as directed. Wound #3 - Foot Wound Laterality: Plantar, Left, Lateral Cleanser: Soap and Water 1 x Per Day/30 Days Discharge Instructions: May shower and wash wound with dial antibacterial soap and water prior to dressing change. Cleanser: Byram Ancillary Kit - 15 Day Supply (Generic) 1 x Per Day/30 Days Discharge Instructions: Use supplies as instructed; Kit contains: (15) Saline Bullets; (15) 3x3 Gauze; 15 pr Gloves Topical: keystone antibiotic compound 1 x Per Day/30 Days Prim Dressing: Hydrofera Blue Classic Foam, 4x4 in 1 x Per Day/30 Days ary Discharge Instructions: Moisten with saline prior to applying to wound bed Secondary Dressing: ABD Pad, 8x10 (Generic) 1 x Per Day/30 Days Discharge Instructions: Apply over primary dressing as  directed. Secondary Dressing: Optifoam Non-Adhesive Dressing, 4x4 in (Generic) 1 x Per Day/30 Days Discharge Instructions: Apply over primary  dressing as directed. Secondary Dressing: Zetuvit Plus 4x8 in (Generic) 1 x Per Day/30 Days Discharge Instructions: Apply over primary dressing as directed. Secondary Dressing: NonWoven Sponge, 4x4 (in/in) (Generic) 1 x Per Day/30 Days Secured With: Coban Self-Adherent Wrap 4x5 (in/yd) (Generic) 1 x Per Day/30 Days Discharge Instructions: Secure with Coban as directed. Secured With: [employer], 4.5x3.1 (in/yd) (Generic) 1 x Per Day/30 Days Discharge Instructions: Secure with Kerlix as directed. Secured With: 38M Medipore H Soft Cloth Surgical T ape, 4 x 10 (in/yd) (Generic) 1 x Per Day/30 Days Discharge Instructions: Secure with tape as directed. Wound #8 - Foot Wound Laterality: Right, Lateral Cleanser: Soap and Water 1 x Per Day/30 Days Discharge Instructions: May shower and wash wound with dial antibacterial soap and water prior to dressing change. Cleanser: Byram Ancillary Kit - 15 Day Supply (Generic) 1 x Per Day/30 Days Discharge Instructions: Use supplies as instructed; Kit contains: (15) Saline Bullets; (15) 3x3 Gauze; 15 pr Gloves Topical: keystone antibiotic compound 1 x Per Day/30 Days Prim Dressing: Hydrofera Blue Classic Foam, 4x4 in 1 x Per Day/30 Days ary Discharge Instructions: Moisten with saline prior to applying to wound bed Secondary Dressing: ABD Pad, 8x10 (Generic) 1 x Per Day/30 Days Discharge Instructions: Apply over primary dressing as directed. Secondary Dressing: Optifoam Non-Adhesive Dressing, 4x4 in (Generic) 1 x Per Day/30 Days Discharge Instructions: Apply over primary dressing as directed. Secondary Dressing: Zetuvit Plus 4x8 in (Generic) 1 x Per Day/30 Days Discharge Instructions: Apply over primary dressing as directed. Secondary Dressing: NonWoven Sponge, 4x4 (in/in) (Generic) 1 x Per Day/30 Days Secured With: Coban Self-Adherent Wrap 4x5 (in/yd) (Generic) 1 x Per Day/30 Days Discharge Instructions: Secure with Coban as directed. Secured With:  [employer], 4.5x3.1 (in/yd) (Generic) 1 x Per Day/30 Days Discharge Instructions: Secure with Kerlix as directed. Secured With: 38M Medipore H Soft Cloth Surgical T ape, 4 x 10 (in/yd) (Generic) 1 x Per Day/30 Days Discharge Instructions: Secure with tape as directed. Electronic Signature(s) Signed: 10/25/2021 5:07:36 PM By: [name] MD FACS Entered By: [name] on 10/25/2021 17:04:04 -------------------------------------------------------------------------------- Problem List Details Patient Name: Date of Service: [name], [name] 10/25/2021 3:30 PM Medical Record Number: [id number] Patient Account Number: [id number] Date of Birth/Sex: Treating RN: 07/15/1985 (36 y.o. 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 08/10/2020 No Yes L97.322 Non-pressure chronic ulcer of left ankle with fat layer exposed 09/29/2021 No Yes Inactive Problems ICD-10 Code Description Active Date Inactive Date L97.518 Non-pressure chronic ulcer of other part of right foot with other specified severity 07/14/2020 07/14/2020 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, right ankle and foot 10/24/2019 10/24/2019 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, left ankle and foot 10/24/2019 10/24/2019 B95.62 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 10/24/2019 10/24/2019 classified  elsewhere Resolved Problems Electronic Signature(s) Signed: 10/25/2021 4:58:03 PM By: [name] MD FACS Entered By: [name] on 10/25/2021 16:58:03 --------------------------------------------------------------------------------  Progress Note Details Patient Name: Date of Service: [name] 10/25/2021 3:30 PM Medical Record Number: [id number] Patient Account Number: [id number] Date of Birth/Sex: Treating RN: Feb 16, 1986 (36 y.o. 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 He comes in for review of a wound over the plantar fifth metatarsal head extending into the lateral part of the foot. He was followed for this previously by his podiatrist Dr. Cornelius Moras. As the patient tells his story he went to see podiatry first for a swelling he developed on the lateral part of his fifth metatarsal head in May. He states this was "open" by podiatry and the area closed. He was followed up in June and it was again opened callus removed and it closed promptly. There were plans being made for surgery on the fifth metatarsal head in June however his blood sugar was apparently too high for anesthesia. Apparently the area was debrided and opened again in June and it is never closed since. Looking over the records from podiatry I am really not able to follow this. It was clear when he was first seen  it was before 5/14 at that point he already had a wound. By 5/17 the ulcer was resolved. I do not see anything about a procedure. On 5/28 noted to have pre-ulcerative moderate keratosis. X-ray noted 1/5 contracted toe and tailor's bunion and metatarsal deformity. On a visit date on 09/28/2018 the dorsal part of the left foot it healed and resolved. There was concern about swelling in his lower extremity he was sent to the ER.. As far as I can tell he was seen in the ER on 7/12 with an ulcer on his left foot. A DVT rule out of the left leg was negative. I do not think I have complete records from podiatry but I am not able to verify the procedures this patient states he had. He states after the last procedure the wound has never closed although I am not able to follow this in the records I have from podiatry. He has not had a recent x-ray The patient has been using Neosporin on the wound. He is wearing a Darco shoe. He is still very active up on his foot working and exercising. Past medical history; type 2 diabetes ketosis-prone, leg swelling with a negative DVT study in July. Non-smoker ABI in our clinic was 0.85 on the left 10/16; substantial wound on the plantar left fifth met head extending laterally almost to the dorsal fifth MTP. We have been using silver alginate we gave him a Darco forefoot off loader. An x-ray did not show evidence of osteomyelitis did note soft tissue emphysema which I think was due to gas tracking through an open wound. There is no doubt in my mind he requires an MRI 10/23; MRI not booked until 3 November at the earliest this is largely due to his glucose sensor in the right arm. We have been using silver alginate. There has been an improvement 10/29; I am still not  exactly sure when his MRI is booked for. He says it is the third but it is the 10th in epic. This definitely needs to be done. He is running a low-grade fever today but no other symptoms. No real improvement in  the 1 02/26/2019 patient presents today for a follow-up visit here in our clinic he is last been seen in the clinic on October 29. Subsequently we were working on getting MRI to evaluate and see what exactly was going on and where we would need to go from the standpoint of whether or not he had osteomyelitis and again what treatments were going be required. Subsequently the patient ended up being admitted to the hospital on 02/07/2019 and was discharged on 02/14/2019. This is a somewhat interesting admission with a discharge diagnosis of pneumonia due to COVID-19 although he was positive for COVID-19 when tested at the urgent care but negative x2 when he was actually in the hospital. With that being said he did have acute respiratory failure with hypoxia and it was noted he also have a left foot ulceration with osteomyelitis. With that being said he did require oxygen for his pneumonia and I level 4 L. He was placed on antivirals and steroids for the COVID-19. He was also transferred to the [facility] at one point. Nonetheless he did subsequently discharged home and since being home has done much better in that regard. The CT angiogram did not show any pulmonary embolism. With regard to the osteomyelitis the patient was placed on vancomycin and Zosyn while in the hospital but has been changed to Augmentin at discharge. It was also recommended that he follow- up with wound care and podiatry. Podiatry however wanted him to see [name] according to the patient prior to them doing anything further. His hemoglobin A1c was 9.9 as noted in the hospital. Have an MRI of the left foot performed while in the hospital on 02/04/2019. 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 There is an overlying plantar open wound noted an abscess tracking back along the lateral aspect of the fifth metatarsal shaft. There is  otherwise diffuse cellulitis and mild fasciitis without findings of polymyositis. The patient did have recently pneumonia secondary to COVID-19 I looked in the chart through epic and it does appear that the patient may need to have an additional x-ray just to ensure everything is cleared and that he has no airspace disease prior to putting him into the [name]. 03/05/2019; patient was readmitted to the clinic last week. He was hospitalized twice for a viral upper respiratory tract infection from 11/1 through 11/4 and then 11/5 through 11/12 ultimately this turned out to be Covid pneumonitis. Although he was discharged on oxygen he is not using it. He says he feels fine. He has no exercise limitation no cough no sputum. His O2 sat in our clinic today was 100% on room air. 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 He received Vanco and Zosyn in the hospital and then was discharged on 2 weeks of Augmentin. I do not see any relevant cultures. He was supposed to follow-up with infectious disease but I do not see that he has an appointment. 12/8; patient saw Dr. Novella Olive of infectious disease last week. He felt that he had had adequate antibiotic therapy. He did not  go to follow-up with Dr. Amalia Hailey of podiatry and I have again talked to him about the pros and cons of this. He does not want to consider a ray amputation of this time. He is aware of the risks of recurrence, migration etc. He started HBO today and tolerated this well. He can complete the Augmentin that I gave him last week. I have looked over the lab work that Dr. Chana Bode ordered his C-reactive protein was 3.3 and his sedimentation rate was 17. The C-reactive protein is never really been measurably that high in this patient 12/15; not much change in the wound today however he has undermining along the lateral part of the foot again  more extensively than last week. He has some rims of epithelialization. We have been using silver alginate. He is undergoing hyperbarics but did not dive today 12/18; in for his obligatory first total contact cast change. Unfortunately there was pus coming from the undermining area around his fifth metatarsal head. This was cultured but will preclude reapplication of a cast. He is seen in conjunction with HBO 12/24; patient had staph lugdunensis in the wound in the undermining area laterally last time. We put him on doxycycline which should have covered this. The wound looks better today. I am going to give him another week of doxycycline before reattempting the total contact cast 12/31; the patient is completing antibiotics. Hemorrhagic debris in the distal part of the wound with some undermining distally. He also had hyper granulation. Extensive debridement with a #5 curette. The infected area that was on the lateral part of the fifth met head is closed over. I do not think he needs any more antibiotics. Patient was seen prior to HBO. Preparations for a total contact cast were made in the cast will be placed post hyperbarics 04/11/19; once again the patient arrives today without complaint. He had been in a cast all week noted that he had heavy drainage this week. This resulted in large raised areas of macerated tissue around the wound 1/14; wound bed looks better slightly smaller. Hydrofera Blue has been changing himself. He had a heavy drainage last week which caused a lot of maceration around the wound so I took him out of a total contact cast he says the drainage is actually better this week He is seen today in conjunction with HBO 1/21; returns to clinic. He was up in [place] for a day or 2 attending a funeral. He comes back in with the wound larger and with a large area of exposed bone. He had osteomyelitis and septic arthritis of the fifth left metatarsal head while he was in hospital. He  received IV antibiotics in the hospital for a prolonged period of time then 3 weeks of Augmentin. Subsequently I gave him 2 weeks of doxycycline for more superficial wound infection. When I saw this last week the wound was smaller the surface of the wound looks satisfactory. 1/28; patient missed hyperbarics today. Bone biopsy I did last time showed Enterococcus faecalis and Staphylococcus lugdunensis . He has a wide area of exposed bone. We are going to use silver alginate as of today. I had another ethical discussion with the patient. 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 In this situation I think the likelihood of healing this is low. Therefore I have recommended a ray amputation and with the patient's agreement I have referred him to Dr. Doran Durand. The other issue is that his compliance with hyperbarics has been minimal  because of his work schedule and given his underlying decision I am going to stop this today READMISSION 10/24/2019 MRI 09/29/2019 left foot IMPRESSION: 1. Apparent skin ulceration inferior and lateral to the 5th metatarsal base with underlying heterogeneous T2 signal and enhancement in the subcutaneous fat. Small peripherally enhancing fluid collections along the plantar and lateral aspects of the 5th metatarsal base suspicious for abscesses. 2. Interval amputation through the mid 5th metatarsal with nonspecific low-level marrow edema and enhancement. Given the proximity to the adjacent soft tissue inflammatory changes, osteomyelitis cannot be excluded. 3. The additional bones appear unremarkable. MRI 09/29/2019 right foot IMPRESSION: 1. Soft tissue ulceration lateral to the 5th MTP joint. There is low-level T2 hyperintensity within the 4th and 5th metatarsal heads and adjacent proximal phalanges without abnormal T1 signal or cortical destruction. These findings are nonspecific and could be seen with early marrow edema, hyperemia or  early osteomyelitis. No evidence of septic joint. 2. Mild tenosynovitis and synovial enhancement associated with the extensor digitorum tendons at the level of the midfoot. 3. Diffuse low-level muscular T2 hyperintensity and enhancement, most consistent with diabetic myopathy. LEFT FOOT BONE Methicillin resistant staphylococcus aureus Staphylococcus lugdunensis MIC MIC CIPROFLOXACIN >=8 RESISTANT Resistant <=0.5 SENSI... Sensitive CLINDAMYCIN <=0.25 SENS... Sensitive >=8 RESISTANT Resistant ERYTHROMYCIN >=8 RESISTANT Resistant >=8 RESISTANT Resistant GENTAMICIN <=0.5 SENSI... Sensitive <=0.5 SENSI... Sensitive Inducible Clindamycin NEGATIVE Sensitive NEGATIVE Sensitive OXACILLIN >=4 RESISTANT Resistant 2 SENSITIVE Sensitive RIFAMPIN <=0.5 SENSI... Sensitive <=0.5 SENSI... Sensitive TETRACYCLINE <=1 SENSITIVE Sensitive <=1 SENSITIVE Sensitive TRIMETH/SULFA <=10 SENSIT Sensitive <=10 SENSIT Sensitive ... Marland Kitchen.. VANCOMYCIN 1 SENSITIVE Sensitive <=0.5 SENSI... Sensitive Right foot bone . Component 3 wk ago Specimen Description BONE Special Requests RIGHT 4 METATARSAL SAMPLE B Gram Stain NO WBC SEEN NO ORGANISMS SEEN Culture RARE METHICILLIN RESISTANT STAPHYLOCOCCUS AUREUS NO ANAEROBES ISOLATED Performed at [facility], [place] [address] [place], [place] [id number] Report Status 10/08/2019 FINAL Organism ID, Bacteria METHICILLIN RESISTANT STAPHYLOCOCCUS AUREUS Resulting Agency [facility] Susceptibility Methicillin resistant staphylococcus aureus MIC CIPROFLOXACIN >=8 RESISTANT Resistant CLINDAMYCIN <=0.25 SENS... Sensitive ERYTHROMYCIN >=8 RESISTANT Resistant GENTAMICIN <=0.5 SENSI... Sensitive Inducible Clindamycin NEGATIVE Sensitive OXACILLIN >=4 RESISTANT Resistant RIFAMPIN <=0.5 SENSI... Sensitive TETRACYCLINE <=1 SENSITIVE Sensitive TRIMETH/SULFA <=10 SENSIT Sensitive ... VANCOMYCIN 1 SENSITIVE Sensitive This is a patient we had in clinic earlier this year with a  wound over his left fifth metatarsal head. 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 Doran Durand on 05/16/2019. He seems to have developed problems with wounds on his bilateral feet in June 2021 although he may have had problems earlier than this. He was in an urgent care with a right foot ulcer on 09/26/2019 and given a course of doxycycline. This was apparently after having trouble getting into see orthopedics. He was seen by podiatry on 09/28/2019 noted to have bilateral lower extremity ulcers including the left lateral fifth metatarsal base and the right subfifth met head. It was noted that had purulent drainage at that time. He required hospitalization from 6/20 through 7/2. This was because of worsening right foot wounds. He underwent bilateral operative incision and drainage and bone biopsies bilaterally. Culture results are listed above. He has been referred back to clinic by Dr. Jacqualyn Posey of podiatry. He is also followed by Dr. Megan Salon  who saw him yesterday. He was discharged from hospital on Zyvox Flagyl and Levaquin and yesterday changed to doxycycline Flagyl and Levaquin. His inflammatory markers on 6/26 showed a sedimentation rate of 129 and a C-reactive protein of 5. This is improved to 14 and 1.3 respectively. This would indicate improvement. ABIs in our clinic today were 1.23 on the right and 1.20 on the left 11/01/2019 on evaluation today patient appears to be doing fairly well in regard to the wounds on his feet at this point. Fortunately there is no signs of active infection at this time. No fevers, chills, nausea, vomiting, or diarrhea. He currently is seeing infectious disease and still under their care at this  point. Subsequently he also has both wounds which she has not been using collagen on as he did not receive that in his packaging he did not call us and let us know that. Apparently that just was missed on the order. Nonetheless we will get that straightened out today. 8/9-Patient returns for bilateral foot wounds, using Prisma with hydrogel moistened dressings, and the wounds appear stable. Patient using surgical shoes, avoiding much pressure or weightbearing as much as possible 8/16; patient has bilateral foot wounds. 1 on the right lateral foot proximally the other is on the left mid lateral foot. Both required debridement of callus and thick skin around the wounds. We have been using silver collagen 8/27; patient has bilateral lateral foot wounds. The area on the left substantially surrounded by callus and dry skin. This was removed from the wound edge. The underlying wound is small. The area on the right measured somewhat smaller today. 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 Unfortunately I did not update his antibiotics during today's visit. 9/10 I reviewed Dr. Hale Bogus last notes he felt he had completed antibiotics his inflammatory markers were reasonably well controlled. He has a small wound on the lateral left foot and a tiny area on the right which is just above closed. He is using Hydrofera Blue with border foam he has bilateral surgical shoes 9/24; 2 week f/u. doing well. right foot is closed. left foot still undermined. 10/14; right foot remains closed at the fifth met head. The area over the base of the left fifth metatarsal has a small open area but considerable undermining towards the plantar foot. Thick callus skin around this suggests an adequate pressure relief. We have talked about this. He says he is going to go back into his cam boot. I suggested a total contact cast he did not seem enamored with this  suggestion 10/26; left foot base of the fifth metatarsal. Same condition as last time. He has skin over the area with an open wound however the skin is not adherent. He went to see Dr. Earleen Newport who did an x-ray and culture of his foot I have not reviewed the x-ray but the patient was not told anything. He is on doxycycline 11/11; since the patient was last here he was in the emergency room on 10/30 he was concerned about swelling in the left foot. They did not do any cultures or x-rays. They changed his antibiotics to cephalexin. Previous culture showed group B strep. The cephalexin is appropriate as doxycycline has less than predictable coverage. Arrives in clinic today with swelling over this area under the wound. He also has a new wound on the right fifth metatarsal head 11/18; the patient has a difficult wound on the lateral aspect  of the left fifth metatarsal head. The wound was almost ballotable last week I opened it slightly expecting to see purulence however there was just bleeding. I cultured this this was negative. X-ray unchanged. We are trying to get an MRI but I am not sure were going to be able to get this through his insurance. He also has an area on the right lateral fifth metatarsal head this looks healthier 12/3; the patient finally got our MRI. Surprisingly this did not show osteomyelitis. I did show the soft tissue ulceration at the lateral plantar aspect of the fifth metatarsal base with a tiny residual 6 mm abscess overlying the superficial fascia I have tried to culture this area I have not been able to get this to grow anything. Nevertheless the protruding tissue looks aggravated. I suspect we should try to treat the underlying "abscess with broad-spectrum antibiotics. I am going to start him on Levaquin and Flagyl. He has much less edema in his legs and I am going to continue to wrap his legs and see him weekly 12/10. I started Levaquin and Flagyl on him last week. He just picked  up the Flagyl apparently there was some delay. The worry is the wound on the left fifth metatarsal base which is substantial and worsening. His foot looks like he inverts at the ankle making this a weightbearing surface. Certainly no improvement in fact I think the measurements of this are somewhat worse. We have been using 12/17; he apparently just got the Levaquin yesterday this is 2 weeks after the fact. He has completed the Flagyl. The area over the left fifth metatarsal base still has protruding granulation tissue although it does not look quite as bad as it did some weeks ago. He has severe bilateral lymphedema although we have not been treating him for wounds on his legs this is definitely going to require compression. There was so much edema in the left I did not wish to put him in a total contact cast today. I am going to increase his compression from 3-4 layer. The area on the right lateral fifth met head actually look quite good and superficial. 12/23; patient arrived with callus on the right fifth met head and the substantial hyper granulated callused wound on the base of his fifth metatarsal. He says he is completing his Levaquin in 2 days but I do not think that adds up with what I gave him but I will have to double check this. We are using Hydrofera Blue on both areas. My plan is to put the left leg in a cast the week after New Year's 04/06/2020; patient's wounds about the same. Right lateral fifth metatarsal head and left lateral foot over the base of the fifth metatarsal. There is undermining on the left lateral foot which I removed before application of total contact cast continuing with Hydrofera Blue new. Patient tells me he was seen by endocrinology today lab work was done [Dr. Kerr]. Also wondering whether he was referred to cardiology. I went over some lab work from previously does not have chronic renal failure certainly not nephrotic range proteinuria he does have very poorly  controlled diabetes but this is not his most updated lab work. Hemoglobin A1c has been over 11 1/10; the patient had a considerable amount of leakage towards mid part of his left foot with macerated skin however the wound surface looks better the area on the right lateral fifth met head is better as well. I am going to  change the dressing on the left foot under the total contact cast to silver alginate, continue with Hydrofera Blue on the right. 1/20; patient was in the total contact cast for 10 days. Considerable amount of drainage although the skin around the wound does not look too bad on the left foot. The area on the right fifth metatarsal head is closed. Our nursing staff reports large amount of drainage out of the left lateral foot wound 1/25; continues with copious amounts of drainage described by our intake staff. PCR culture I did last week showed E. coli and Enterococcus faecalis and low quantities. Multiple resistance genes documented including extended spectrum beta lactamase, MRSA, MRSE, quinolone, tetracycline. The wound is not quite as good this week as it was 5 days ago but about the same size 2/3; continues with copious amounts of malodorous drainage per our intake nurse. The PCR culture I did 2 weeks ago showed E. coli and low quantities of Enterococcus. There were multiple resistance genes detected. I put Neosporin on him last week although this does not seem to have helped. The wound is slightly deeper today. Offloading continues to be an issue here although with the amount of drainage she has a total contact cast is just not going to work 2/10; moderate amount of drainage. Patient reports he cannot get his stocking on over the dressing. I told him we have to do that the nurse gave him suggestions on how to make this work. The wound is on the bottom and lateral part of his left foot. Is cultured predominantly grew low amounts of Enterococcus, E. coli and anaerobes. There were  multiple resistance genes detected including extended spectrum beta lactamase, quinolone, tetracycline. I could not think of an easy oral combination to address this so for now I am going to do topical antibiotics provided by [facility] I think the main agents here are vancomycin and an aminoglycoside. We have to be able to give him access to the wounds to get the topical antibiotic on 2/17; moderate amount of drainage this is unchanged. He has his Keystone topical antibiotic against the deep tissue culture organisms. He has been using this and changing the dressing daily. Silver alginate on the wound surface. 2/24; using Keystone antibiotic with silver alginate on the top. He had too much drainage for a total contact cast at one point although I think that is improving and I think in the next week or 2 it might be possible to replace a total contact cast I did not do this today. In general the wound surface looks healthy however he continues to have thick rims of skin and subcutaneous tissue around the wide area of the circumference which I debrided 06/04/2020 upon evaluation today patient appears to be doing well in regard to his wound. I do feel like he is showing signs of improvement. There is little bit of callus and dead tissue around the edges of the wound as well as what appears to be a little bit of a sinus tract that is off to the side laterally I would perform debridement to clear that away today. 3/17; left lateral foot. The wound looks about the same as I remember. Not much depth surface looks healthy. No evidence of infection 3/25; left lateral foot. Wound surface looks about the same. Separating epithelium from the circumference. There really is no evidence of infection here however not making progress by my view 3/29; left lateral foot. Surface of the wound again looks reasonably healthy still  thick skin and subcutaneous tissue around the wound margins. There is no evidence of  infection. One of the concerns being brought up by the nurses has again the amount of drainage vis--vis continued use of a total contact cast 4/5; left lateral foot at roughly the base of the fifth metatarsal. Nice healthy looking granulated tissue with rims of epithelialization. The overall wound measurements are not any better but the tissue looks healthy. The only concern is the amount of drainage although he has no surrounding maceration with what we have been doing recently to absorb fluid and protect his skin. He also has lymphedema. 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 He states that the kerlix/coban under the TCC rolled down and could not get it back up. He has been using an offloading scooter and has somehow hurt his right foot using this device. This happened last week. He states that the side of his right foot developed a blister and opened. The top of his foot also has a few small open wounds he thinks is due to his socks rubbing in his shoes. He has not been using any dressings to the wound. He denies purulent drainage, fever/chills or erythema to the wounds. 4/22; patient presents for 1 week follow-up. He developed new wounds to the right foot that were evaluated at last clinic visit. He continues to have a total contact cast to the left leg and he reports no issues. He has been using silver collagen to the right foot wounds with no issues. He denies purulent drainage, fever/chills or erythema to the right foot wounds. He has no complaints today 4/25; patient presents for 1 week follow-up. He has a total contact cast of the left leg and reports no issues. He has been using silver alginate to the right foot wound. He denies purulent  drainage, fever/chills or erythema to the right foot wounds. 5/2 patient presents for 1 week follow-up. T contact cast on the left. The wound which is on the base of the plantar foot at the base of the fifth metatarsal otal actually looks quite good and dimensions continue to gradually contract. HOWEVER the area on the right lateral fifth metatarsal head is much larger than what I remember from 2 weeks ago. Once more is he has significant levels of hypergranulation. Noteworthy that he had this same hyper granulated response on his wound on the left foot at one point in time. So much so that he I thought there was an underlying fluid collection. Based on this I think this just needs debridement. 5/9; the wound on the left actually continues to be gradually smaller with a healthy surface. Slight amount of drainage and maceration of the skin around but not too bad. However he has a large wound over the right fifth metatarsal head very much in the same configuration as his left foot wound was initially. I used silver nitrate to address the hyper granulated tissue no mechanical debridement 5/16; area on the left foot did not look as healthy this week deeper thick surrounding macerated skin and subcutaneous tissue. oo The area on the right foot fifth met head was about the same oo The area on the right ankle that we identified last week is  completely broken down into an open wound presumably a stocking rubbing issue 5/23; patient has been using a total contact cast to the left side. He has been using silver alginate underneath. He has also been using silver alginate to the right foot wounds. He has no complaints today. He denies any signs of infection. 5/31; the left-sided wound looks some better measure smaller surface granulation looks better. We have been using silver alginate under the total contact cast oo The large area on his right fifth met head and right dorsal foot look about the same still  using silver alginate 6/6; neither side is good as I was hoping although the surface area dimensions are better. A lot of maceration on his left and right foot around the wound edge. Area on the dorsal right foot looks better. He says he was traveling. I am not sure what does the amount of maceration around the plantar wounds may be drainage issues 6/13; in general the wound surfaces look quite good on both sides. Macerated skin and raised edges around the wound required debridement although in general especially on the left the surface area seems improved. oo The area on the right dorsal ankle is about the same I thought this would not be such a problem to close 6/20; not much change in either wound although the one on the right looks a little better. Both wounds have thick macerated edges to the skin requiring debridements. We have been using silver alginate. The area on the dorsal right ankle is still open I thought this would be closed. 6/28; patient comes in today with a marked deterioration in the right foot wound fifth met head. Wide area of exposed bone this is a drastic change from last time. The area on the left there we have been casting is stagnant. We have been using silver alginate in both wound areas. 7/5; bone culture I did for PCR last time was positive for Pseudomonas, group B strep, Enterococcus and Staph aureus. There was no suggestion of methicillin resistance or ampicillin resistant genes. This was resistant to tetracycline however He comes into the clinic today with the area over his right plantar fifth metatarsal head which had been doing so well 2 weeks ago completely necrotic feeling bone. I do not know that this is going to be salvageable. The left foot wound is certainly no smaller but it has a better surface and is superficial. 7/8; patient called in this morning to say that his total contact cast was rubbing against his foot. He states he is doing fine overall. He denies  signs of infection. 7/12; continued deterioration in the wound over the right fifth metatarsal head crumbling bone. This is not going to be salvageable. The patient agrees and wants to be referred to Dr. Doran Durand which we will attempt to arrange as soon as possible. I am going to continue him on antibiotics as long as that takes so I will renew those today. The area on the left foot which is the base of the fifth metatarsal continues to look somewhat better. Healthy looking tissue no depth no debridement is necessary here. 7/20; the patient was kindly seen by Dr. Doran Durand of orthopedics on 10/19/2020. 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 Towards this end we have taken him out of the total contact cast on the left we will put him in a  wrap with Hydrofera Blue. As I understand things surgery is planned for 7/21 7/27; patient had his surgery last Thursday. 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 Apparently everything went well we did not still disturb that today The area on the left foot actually looks quite good. He has been much less mobile which probably explains this he did not seem to do well in the total contact cast secondary to drainage and maceration I think. 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 Fortunately there is no evidence of active infection at this time. No fevers, chills, nausea, vomiting, or diarrhea. Overall I think that he is actually doing extremely well. Nonetheless I do believe that he is staying off of this more following the surgery in his right foot that is the reason the left is doing so great. 8/16; left plantar foot wound. This looks smaller than the last time I saw this he is using Hydrofera Blue. The surgical wound on the right foot is being followed by Dr.  Doran Durand we did not look at this today. He has surgical shoes on both feet 8/23; left plantar foot wound not as good this week. Surrounding macerated skin and subcutaneous tissue everything looks moist and wet. I do not think he is offloading this adequately. He is using a surgical shoe Apparently the right foot surgical wound is not open although I did not check his foot 8/31; left plantar foot lateral aspect. Much improved this week. He has no maceration. Some improvement in the surface area of the wound but most impressively the depth is come in we are using silver alginate. The patient is a Product/process development scientist. He is asked that we write him a letter so he can go back to work. I have also tried to see if we can write something that will allow him to limit the amount of time that he is on his foot at work. 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 Hopefully the latter can be avoided 9/6; I believe the patient missed an appointment last week. He arrives in today with a wound looking roughly the same certainly no better. Undermining laterally and also inferiorly. We used molecuLight today in training with the patient's permission.. We are using silver alginate 9/21 wound is measuring bigger this week although this may have to do with the aggressive circumferential debridement last week in response to the blush fluorescence on the MolecuLight. Culture I did last week showed significant MSSA and E. coli. I put him on Augmentin but he has not started it yet. We are also going to send this for compounded antibiotics at [facility]. There is no evidence of systemic infection 9/29; silver alginate. His Keystone arrived. He is completing Augmentin in 2 days. Offloading in a cam boot. Moderate drainage per our intake staff 10/5; using silver alginate. He has been using his Liberal. He has completed his Augmentin. Per our intake  nurse still a lot of drainage, far too much to consider a total contact cast. Wound measures about the same. He had the same undermining area that I defined last week from a roughly 11-3. I remove this today 10/12; using silver alginate he is using the [place]. He comes in for a nurse visit hence we are applying [employer] twice a week. Measuring  slightly better today and less notable drainage. Extensive debridement of the wound edge last time 10/18; using topical Keystone and silver alginate and a soft cast. Wound measurements about the same. Drainage was through his soft cast. We are changing this twice a week Tuesdays and Friday 10/25; comes in with moderate drainage. Still using Keystone silver alginate and a soft cast. Wound dimensions completely the same.He has a lot of edema in the left leg he has lymphedema. Asking for [name] to consider wrapping him as he cannot get his stocking on over the soft cast 11/2; comes in with moderate to large drainage slightly smaller in terms of width we have been using [place]. His wound looks satisfactory but not much improvement 11/4; patient presents today for obligatory cast change. Has no issues or complaints today. He denies signs of infection. 11/9; patient traveled this weekend to [place], was on the cast quite a bit. Staining of the cast with black material from his walking boot. Drainage was not quite as bad as we feared. Using silver alginate and Keystone 11/16; we do not have size for cast therefore we have been putting a soft cast on him since the change on Friday. Still a significant amount of drainage necessitating changing twice a week. We have been using the Keystone at cast changes either hard or soft as well as silver alginate Comes in the clinic with things actually looking fairly good improvement in width. He says his offloading is about the same 02/24/2021 upon evaluation today patient actually comes back in and is doing excellent in regard to his  foot ulcer this is significantly smaller even compared to the last visit. The soft cast seems to have done extremely well for him which is great news. I do not see any signs of infection minimal debridement will be needed today. 11/30; left lateral foot much improved half a centimeter improvement in surface area. No evidence of infection. He seems to be doing better with the soft cast in the TCC therefore we will continue with this. He comes back in later in the week for a change with the nurses. This is due to drainage 12/6; no improvement in dimensions. Under illumination some debris on the surface we have been using silver alginate, soft cast. If there is anything optimistic here he seems to have have less drainage 12/13. Dimensions are improved both length and width and slightly in depth. Appears to be quite healthy today. Raised edges of this thick skin and callus around the edges however. He is in a soft cast were bringing him back once for a change on Friday. Drainage is better 12/20. Dimensions are improved. He still has raised edges of thick skin and callus around the edges. We are using a soft cast 12/28; comes in today with thick callus around the wound. Using silver under alginate under a soft cast. I do not think there is much improvement in any measurement 2023 04/06/2021; patient was put in a total contact cast. Unfortunately not much change in surface area 1/10; not much different still thick callus and skin around the edge in spite of the total contact cast. This was just debrided last week we have been using the [facility] compounded antibiotic and silver alginate under a total contact cast 1/18 the patient's wound on the left side is doing nicely. smaller HOWEVER he comes in today with a wound on the right foot laterally. blister most likely serosangquenous drainage 1/24; the patient continues to do well in terms  of the plantar left foot which is continued to contract using silver  alginate under the total contact cast HOWEVER the right lateral foot is bigger with denuded skin around the edges. I used pickups and a #15 scalpel to remove this this looks like the remanence of a large blister. Cannot rule out infection. Culture in this area I did last week showed Staphylococcus lugdunensis few colonies. I am going to try to address this with his [employer] antibiotic that is done so well on the left having linezolid and this should cover the staph 2/1; the patient's wound on his left foot which was the original plantar foot wound thick skin and eschar around the edges even in the total contact cast but the wound surface does not look too bad The real problem is on how his right lateral foot at roughly the base of the fifth metatarsal. The wound is completely necrotic more worrisome than that there is swelling around the edges of this. We have been using silver alginate on both wounds and Keystone on the right foot. Unfortunately I think he is going to require systemic antibiotics while we await cultures. He did not get the x-ray done that we ordered last week [lost the prescription 2/7; disappointingly in the area on the left foot which we are treating with a total contact cast is still not closed although it is much smaller. He continues to have a lot of callus around the wound edge. -Right lateral foot culture I did last week was negative x-ray also negative for osteomyelitis. 2/15: TCC silver alginate on the left and silver alginate on the right lateral. No real improvement in either area 05/26/2021: T oday, the wounds are roughly the same size as at his previous visit, post-debridement. He continues to endorse fairly substantial drainage, particularly on the right. He has been in a total contact cast on the left. There is still some callus surrounding this lesion. On the right, the periwound skin is quite macerated, along with surrounding callus. The center of the right-sided  wound also has some dark, densely adherent material, which is very difficult to remove. 06/02/2021: Today, both wounds are slightly smaller. He has been using zinc oxide ointment around the right ulcer and the degree of maceration has improved markedly. There continues to be an area of nonviable tissue in the center of the right sided ulcer. The left-sided wound, which has been in the total contact cast. Appears clean and the degree of callus around it is less than previously. 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 He had to take the stairs and both wounds have increased in size. The left foot, which has been in a total contact cast, has developed a tunnel tracking to the lateral aspect of his foot. The nonviable tissue in the center of the right-sided ulcer remains recalcitrant to debridement. There is significant undermining surrounding the entirety of the left sided wound. 06/16/2021: The elevator at school has been fixed and the patient has been able to avoid putting as much weight on his wounds over the past week. We converted the left foot wound into a single lesion today, but despite this, the wound is actually smaller. The base is healthy with limited periwound callus. On the right, the central necrotic area is still present. He continues to be quite macerated around the right sided wound, despite applying barrier cream. This does, however, have the benefit of softening the callus  to make it more easily removable. 06/23/2021: Today, the left wound is smaller. The lateral aspect that had opened up previously is now closed. The wound base has a healthy bed of granulation tissue and minimal slough. Unfortunately, on the right, the wound is larger and continues to be fairly macerated. He has also reopened the wound at his right ankle. He thinks this is due to the gait he has adopted secondary to his total contact cast and  boot. 06/30/2021: T oday, both wounds are a little bit larger. The lateral aspect on the left has remained closed. He continues to have significant periwound maceration. The culture that I took from the right sided wound grew a population of bacteria that is not covered by his current [facility] antibiotic. The center of the right- sided wound continues to appear necrotic with nonviable fat. It probes deeper today, but does not reach bone. 07/07/2021: The periwound maceration is a little bit less today. The right lateral foot wound has some areas that appear more viable and the necrotic center also looks a little bit better. The wound on the dorsal surface of his right foot near the ankle is contracting and the surface appears healthy. The left plantar wound surface looks healthy, but there is some new undermining on the medial portion. He did get his new Keystone antibiotic and began applying that to the right foot wound on Saturday. 07/14/2021: The intake nurse reported substantial drainage from his wounds, but the periwound skin actually looks better than is typical for him. The wound on the dorsal surface of his right foot near the ankle is smaller and just has a small open area underneath some dried eschar. The left plantar wound surface looks healthy and there has been no significant accumulation of callus. The right lateral foot wound looks quite a bit better, with the central portion, which typically appears necrotic, looking more viable albeit pale. 07/22/2021: His left foot is extremely macerated today. The wound is about the same size. The wound on the dorsal surface of his right foot near the ankle had closed, but he traumatized it removing the dressing and there is a tiny skin tear in that location. The right lateral foot wound is bigger, but the surface appears healthy. 07/30/2021: The wound on the dorsal surface of his right foot near the ankle is closed. The right lateral foot wound again is a  little bit bigger due to some undermining. The periwound skin is in better condition, however. He has been applying zinc oxide. The wound surface is a little bit dry today. On the left, he does not have the substantial maceration that we frequently see. The wound itself is smaller and has a clean surface. 08/06/2021: Both wounds seem to have deteriorated over the past week. The right lateral foot wound has a dry surface but the periwound is boggy.. Overall wound dimensions are about the same. On the left, the wound is about the same size, but there is more undermining present underneath periwound callus. 08/13/2021: The right sided wound looks about the same, but on the left there has been substantial deterioration. The undermining continues to extend under periwound callus. Once this was removed, substantial extension of the wound was present. There is no odor or purulent drainage but clearly the wounds have broken down. 08/20/2021: The wounds look about the same today. He has been out of his total contact cast and has just been changing the dressings using topical Keystone with PolyMem Ag, Kerlix and  Ace bandages. The wound on the top of his right ankle has reopened but this is quite small. There was a little bit of purulent material that I expressed when examining this wound. 08/24/2021: After the aggressive debridement I performed at his last visit, the wounds actually look a little bit better today. They are smaller with the exception of the wound on the top of his right ankle which is a little bit bigger as some more skin pulled off when he was changing his dressing. We are using topical Keystone with PolyMem Ag Kerlix and Ace bandages. 09/02/2021: There has been really no change to any of his wounds. 09/16/2021: The patient was hospitalized last week with nausea, vomiting, and dehydration. He says that while he was in the hospital, his wounds were not really addressed properly. T oday, both plantar  foot wounds are larger and the periwound skin is macerated. The wound on the dorsum of his right foot has a scab on the top. The right foot now has a crater where previously he had had nonviable fat. It looks as though this simply died and fell out. The periwound callus is wet. 09/24/2021: His wounds have deteriorated somewhat since his last visit. The wound on the dorsum of his right foot near his ankle is larger and has more nonviable tissue present. The crater in his right foot is even deeper; I cannot quite palpate or probe to bone but I am sure it is close. The wound on his left plantar foot has an odd boggy area in the center that almost feels as though it has fluid within it. He has run out of his topical Keystone antibiotic. We are using silver alginate on his wounds. 09/29/2021: He has developed a new wound on the dorsum of his left foot near his ankle. He says he thinks his wrapping is rubbing in that site. I would concur with this as the wound on his right ankle is larger. The left foot looks about the same. The right foot has the crater that was present last week. No significant slough accumulation, but his foot remains quite swollen and warm despite oral antibiotic therapy. 10/08/2021: All of his wounds look about the same as last week. He did not start his oral antibiotics that are prescribed until just a couple of days ago; his [employer] compounded antibiotics formula has been changed and he is awaiting delivery of the new recipe. His MRI that was scheduled for earlier this week was canceled as no prior authorization had been obtained; unfortunately the tech responsible sent an email to my old [place] email, which I no longer use nor have access to. 10/18/2021: The wounds on his bilateral dorsal feet near the ankles are both improved. They are smaller and have just some eschar and slough buildup. The left plantar wound has a fair amount of undermining, but the surface is clean. There is  some periwound callus accumulation. On the right plantar foot, there is nonviable fat leading to a deep tunnel that tracks towards his dorsal medial foot. There is periwound callus and slough accumulation, as well. His right foot and leg remain swollen as compared to the left. 10/25/2021: The wounds on his bilateral dorsal feet and at the ankles have broken down somewhat. They are little bit larger than last week. The left plantar wound continues to undermine laterally but the surface is clean. The right plantar foot wound shows some decreased depth in the tunnel tracking towards his dorsal medial foot.  He has not yet had the Doppler study that I ordered; it sounds like there is some confusion about the scheduling of the procedure. In addition, the MRI was denied and I have taken steps to appeal the denial. Patient History Information obtained from Patient. Family History No family history of Cancer, Diabetes, Hereditary Spherocytosis, Hypertension, Kidney Disease, Lung Disease, Seizures, Stroke, Thyroid Problems, Tuberculosis. Social History Never smoker, Marital Status - Single, Alcohol Use - Rarely, Drug Use - No History, Caffeine Use - Never. Medical History Eyes Denies history of Cataracts, Glaucoma, Optic Neuritis Ear/Nose/Mouth/Throat Denies history of Chronic sinus problems/congestion, Middle ear problems Hematologic/Lymphatic Denies history of Anemia, Hemophilia, Human Immunodeficiency Virus, Lymphedema, Sickle Cell Disease Respiratory Denies history of Aspiration, Asthma, Chronic Obstructive Pulmonary Disease (COPD), Pneumothorax, Sleep Apnea, Tuberculosis Cardiovascular Denies history of Angina, Arrhythmia, Congestive Heart Failure, Coronary Artery Disease, Deep Vein Thrombosis, Hypertension, Hypotension, Myocardial Infarction, Peripheral Arterial Disease, Peripheral Venous Disease, Phlebitis, Vasculitis Gastrointestinal Denies history of Cirrhosis , Colitis, Crohnoos, Hepatitis  A, Hepatitis B, Hepatitis C Endocrine Patient has history of Type II Diabetes Denies history of Type I Diabetes Immunological Denies history of Lupus Erythematosus, Raynaudoos, Scleroderma Integumentary (Skin) Denies history of History of Burn Musculoskeletal Denies history of Gout, Rheumatoid Arthritis, Osteoarthritis, Osteomyelitis Neurologic Denies history of Dementia, Neuropathy, Quadriplegia, Paraplegia, Seizure Disorder Oncologic Denies history of Received Chemotherapy, Received Radiation Psychiatric Denies history of Anorexia/bulimia, Confinement Anxiety Hospitalization/Surgery History - 11/1-11/06/2018- sepsis foot infection. - 11/4-11/5 02 sats low respiratory distress. Objective Constitutional Slightly tachycardic, asymptomatic. No acute distress.. Vitals Time Taken: 3:48 PM, Height: 77 in, Weight: 280 lbs, BMI: 33.2, Temperature: 98.6 F, Pulse: 103 bpm, Respiratory Rate: 18 breaths/min, Blood Pressure: 133/83 mmHg, Capillary Blood Glucose: 79 mg/dl. General Notes: glucose per pt report this am Respiratory Normal work of breathing on room air.. General Notes: 10/25/2021: The wounds on his bilateral dorsal feet and at the ankles have broken down somewhat. They are little bit larger than last week. The left plantar wound continues to undermine laterally but the surface is clean. The right plantar foot wound shows some decreased depth in the tunnel tracking towards his dorsal medial foot. Integumentary (Hair, Skin) Wound #11 status is Open. Original cause of wound was Gradually Appeared. The date acquired was: 08/20/2021. The wound has been in treatment 9 weeks. The wound is located on the [place]. The wound measures 1.3cm length x 1.8cm width x 0.1cm depth; 1.838cm^2 area and 0.184cm^3 volume. There is Fat Layer (Subcutaneous Tissue) exposed. There is no tunneling or undermining noted. There is a medium amount of serosanguineous drainage noted. The wound margin is  distinct with the outline attached to the wound base. There is medium (34-66%) red, pink granulation within the wound bed. There is a medium (34-66%) amount of necrotic tissue within the wound bed including Adherent Slough. Wound #12 status is Open. Original cause of wound was Gradually Appeared. The date acquired was: 09/29/2021. The wound has been in treatment 3 weeks. The wound is located on the Left,Dorsal Foot. The wound measures 1.2cm length x 1.2cm width x 0.1cm depth; 1.131cm^2 area and 0.113cm^3 volume. There is no tunneling or undermining noted. There is a medium amount of serosanguineous drainage noted. The wound margin is distinct with the outline attached to the wound base. There is medium (34-66%) red granulation within the wound bed. There is a medium (34-66%) amount of necrotic tissue within the wound bed including Adherent Slough. Wound #3 status is Open. Original cause of wound was Trauma. The date  acquired was: 10/02/2019. The wound has been in treatment 104 weeks. The wound is located on the [place]. The wound measures 4.4cm length x 3.8cm width x 2.1cm depth; 13.132cm^2 area and 27.577cm^3 volume. There is Fat Layer (Subcutaneous Tissue) exposed. There is no tunneling noted, however, there is undermining starting at 3:00 and ending at 9:00 with a maximum distance of 1.7cm. There is a medium amount of serosanguineous drainage noted. The wound margin is well defined and not attached to the wound base. There is small (1-33%) pink granulation within the wound bed. There is a medium (34-66%) amount of necrotic tissue within the wound bed including Adherent Slough. Wound #8 status is Open. Original cause of wound was Blister. The date acquired was: 04/18/2021. The wound has been in treatment 26 weeks. The wound is located on the Right,Lateral Foot. The wound measures 4.2cm length x 3cm width x 1.1cm depth; 9.896cm^2 area and 10.886cm^3 volume. There is Fat  Layer (Subcutaneous Tissue) exposed. There is no tunneling or undermining noted. There is a large amount of serosanguineous drainage noted. The wound margin is well defined and not attached to the wound base. There is medium (34-66%) red, pink granulation within the wound bed. There is a medium (34-66%) amount of necrotic tissue within the wound bed including Adherent Slough. 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 .Severity of Tissue Pre Debridement is: Fat layer exposed. There was a Selective/Open Wound Non-Viable Tissue Debridement with a total area of 2.34 sq cm performed by [name], MD. With the following instrument(s): Curette to remove Non-Viable tissue/material. Material removed includes [facility]. A time out was conducted at 16:27, prior to the start of the procedure. A Minimum amount of bleeding was controlled with Pressure. The procedure was tolerated well with a pain level of 0 throughout and a pain level of 0 following the procedure. Post Debridement Measurements: 1.3cm length x 1.8cm width x 0.1cm depth; 0.184cm^3 volume. Character of Wound/Ulcer Post Debridement is stable. Severity of Tissue Post Debridement is: Fat layer exposed. Post procedure Diagnosis Wound #11: Same as Pre-Procedure Wound #12 Pre-procedure diagnosis of Wound #12 is a Diabetic Wound/Ulcer of the Lower Extremity located on the Left,Dorsal Foot .Severity of Tissue Pre Debridement is: Fat layer exposed. There was a Excisional  Skin/Subcutaneous Tissue Debridement with a total area of 1.44 sq cm performed by [name], MD. With the following instrument(s): Curette to remove Viable and Non-Viable tissue/material. Material removed includes Subcutaneous Tissue and Slough and. A time out was conducted at 16:27, prior to the start of the procedure. A Minimum amount of bleeding was controlled with Pressure. The procedure was tolerated well with a pain level of 0 throughout and a pain level of 0 following the procedure. Post Debridement Measurements: 1.2cm length x 1.2cm width x 0.1cm depth; 0.113cm^3 volume. Character of Wound/Ulcer Post Debridement is stable. Severity of Tissue Post Debridement is: Fat layer exposed. Post procedure Diagnosis Wound #12: Same as Pre-Procedure Wound #3 Pre-procedure diagnosis of Wound #3 is a Diabetic Wound/Ulcer of the Lower Extremity located on the Left,Lateral,Plantar Foot .Severity of Tissue Pre  Debridement is: Fat layer exposed. There was a Excisional Skin/Subcutaneous Tissue Debridement with a total area of 16.72 sq cm performed by [name], MD. With the following instrument(s): Curette to remove Viable and Non-Viable tissue/material. Material removed includes Callus, Subcutaneous Tissue, and Slough. A time out was conducted at 16:27, prior to the start of the procedure. A Minimum amount of bleeding was controlled with Pressure. The procedure was tolerated well with a pain level of 0 throughout and a pain level of 0 following the procedure. Post Debridement Measurements: 4.4cm length x 3.8cm width x 2.1cm depth; 27.577cm^3 volume. Character of Wound/Ulcer Post Debridement is stable. Severity of Tissue Post Debridement is: Fat layer exposed. Post procedure Diagnosis Wound #3: Same as Pre-Procedure Wound #8 Pre-procedure diagnosis of Wound #8 is a Diabetic Wound/Ulcer of the Lower Extremity located on the Right,Lateral Foot .Severity of Tissue Pre Debridement is: Fat layer  exposed. There was a Excisional Skin/Subcutaneous Tissue Debridement with a total area of 12.6 sq cm performed by [name], MD. With the following instrument(s): Curette to remove Viable and Non-Viable tissue/material. Material removed includes Callus, Subcutaneous Tissue, and Slough. A time out was conducted at 16:27, prior to the start of the procedure. A Minimum amount of bleeding was controlled with Pressure. The procedure was tolerated well with a pain level of 0 throughout and a pain level of 0 following the procedure. Post Debridement Measurements: 4.2cm length x 3cm width x 1.1cm depth; 10.886cm^3 volume. Character of Wound/Ulcer Post Debridement is stable. Severity of Tissue Post Debridement is: Fat layer exposed. Post procedure Diagnosis Wound #8: Same as Pre-Procedure Plan Follow-up Appointments: Return Appointment in 1 week. - Dr. [name] - Room 1 with Joni Fears 11/02/21 @ 13:15 Bathing/ Shower/ Hygiene: May shower and wash wound with soap and water. Edema Control - Lymphedema / SCD / Other: Elevate legs to the level of the heart or above for 30 minutes daily and/or when sitting, a frequency of: - throughout the day Avoid standing for long periods of time. Exercise regularly Compression stocking or Garment 20-30 mm/Hg pressure to: - to both legs daily Off-Loading: Open toe surgical shoe to: - right foot Additional Orders / Instructions: Follow Nutritious Diet WOUND #11: - Foot Wound Laterality: Right, Anterior Cleanser: Soap and Water 1 x Per Day/30 Days Discharge Instructions: May shower and wash wound with dial antibacterial soap and water prior to dressing change. Cleanser: Byram Ancillary Kit - 15 Day Supply (Generic) 1 x Per Day/30 Days Discharge Instructions: Use supplies as instructed; Kit contains: (15) Saline Bullets; (15) 3x3 Gauze; 15 pr Gloves Topical: keystone antibiotic compound 1 x Per Day/30 Days Prim Dressing: Hydrofera Blue Classic Foam, 4x4 in 1 x Per  Day/30 Days ary Discharge Instructions: Moisten with saline prior to applying to wound bed Secondary Dressing: NonWoven Sponge, 4x4 (in/in) (Generic) 1 x Per Day/30 Days Secured With: 57M Medipore H Soft Cloth Surgical T ape, 4 x 10 (in/yd) (Generic) 1 x Per Day/30 Days Discharge Instructions: Secure with tape as directed. WOUND #12: - Foot Wound Laterality: Dorsal, Left Cleanser: Soap and Water 1 x Per Day/30 Days Discharge Instructions: May shower and wash wound with dial antibacterial soap and water prior to dressing change. Cleanser: Byram Ancillary Kit - 15 Day Supply (Generic) 1 x Per Day/30 Days Discharge Instructions: Use supplies as instructed; Kit contains: (15) Saline Bullets; (15) 3x3 Gauze; 15 pr Gloves Topical: keystone antibiotic compound 1 x Per Day/30 Days Prim Dressing: Hydrofera Blue Classic Foam, 4x4 in 1 x  Per Day/30 Days ary Discharge Instructions: Moisten with saline prior to applying to wound bed Secondary Dressing: NonWoven Sponge, 4x4 (in/in) (Generic) 1 x Per Day/30 Days Secured With: 17M Medipore H Soft Cloth Surgical T ape, 4 x 10 (in/yd) (Generic) 1 x Per Day/30 Days Discharge Instructions: Secure with tape as directed. WOUND #3: - Foot Wound Laterality: Plantar, Left, Lateral Cleanser: Soap and Water 1 x Per Day/30 Days Discharge Instructions: May shower and wash wound with dial antibacterial soap and water prior to dressing change. Cleanser: Byram Ancillary Kit - 15 Day Supply (Generic) 1 x Per Day/30 Days Discharge Instructions: Use supplies as instructed; Kit contains: (15) Saline Bullets; (15) 3x3 Gauze; 15 pr Gloves Topical: keystone antibiotic compound 1 x Per Day/30 Days Prim Dressing: Hydrofera Blue Classic Foam, 4x4 in 1 x Per Day/30 Days ary Discharge Instructions: Moisten with saline prior to applying to wound bed Secondary Dressing: ABD Pad, 8x10 (Generic) 1 x Per Day/30 Days Discharge Instructions: Apply over primary dressing as  directed. Secondary Dressing: Optifoam Non-Adhesive Dressing, 4x4 in (Generic) 1 x Per Day/30 Days Discharge Instructions: Apply over primary dressing as directed. Secondary Dressing: Zetuvit Plus 4x8 in (Generic) 1 x Per Day/30 Days Discharge Instructions: Apply over primary dressing as directed. Secondary Dressing: NonWoven Sponge, 4x4 (in/in) (Generic) 1 x Per Day/30 Days Secured With: Coban Self-Adherent Wrap 4x5 (in/yd) (Generic) 1 x Per Day/30 Days Discharge Instructions: Secure with Coban as directed. Secured With: [employer], 4.5x3.1 (in/yd) (Generic) 1 x Per Day/30 Days Discharge Instructions: Secure with Kerlix as directed. Secured With: 17M Medipore H Soft Cloth Surgical T ape, 4 x 10 (in/yd) (Generic) 1 x Per Day/30 Days Discharge Instructions: Secure with tape as directed. WOUND #8: - Foot Wound Laterality: Right, Lateral Cleanser: Soap and Water 1 x Per Day/30 Days Discharge Instructions: May shower and wash wound with dial antibacterial soap and water prior to dressing change. Cleanser: Byram Ancillary Kit - 15 Day Supply (Generic) 1 x Per Day/30 Days Discharge Instructions: Use supplies as instructed; Kit contains: (15) Saline Bullets; (15) 3x3 Gauze; 15 pr Gloves Topical: keystone antibiotic compound 1 x Per Day/30 Days Prim Dressing: Hydrofera Blue Classic Foam, 4x4 in 1 x Per Day/30 Days ary Discharge Instructions: Moisten with saline prior to applying to wound bed Secondary Dressing: ABD Pad, 8x10 (Generic) 1 x Per Day/30 Days Discharge Instructions: Apply over primary dressing as directed. Secondary Dressing: Optifoam Non-Adhesive Dressing, 4x4 in (Generic) 1 x Per Day/30 Days Discharge Instructions: Apply over primary dressing as directed. Secondary Dressing: Zetuvit Plus 4x8 in (Generic) 1 x Per Day/30 Days Discharge Instructions: Apply over primary dressing as directed. Secondary Dressing: NonWoven Sponge, 4x4 (in/in) (Generic) 1 x Per Day/30 Days Secured  With: Coban Self-Adherent Wrap 4x5 (in/yd) (Generic) 1 x Per Day/30 Days Discharge Instructions: Secure with Coban as directed. Secured With: [employer], 4.5x3.1 (in/yd) (Generic) 1 x Per Day/30 Days Discharge Instructions: Secure with Kerlix as directed. Secured With: 17M Medipore H Soft Cloth Surgical T ape, 4 x 10 (in/yd) (Generic) 1 x Per Day/30 Days Discharge Instructions: Secure with tape as directed. 10/25/2021: The wounds on his bilateral dorsal feet and at the ankles have broken down somewhat. They are little bit larger than last week. The left plantar wound continues to undermine laterally but the surface is clean. The right plantar foot wound shows some decreased depth in the tunnel tracking towards his dorsal medial foot. I used a curette to debride slough and nonviable subcutaneous tissue from  the dorsal ankle wounds; I also debrided slough, periwound callus, and nonviable subcutaneous tissue from both of the foot wounds. We have reordered the ultrasound to look for possible DVT and are gathering the necessary information to appeal the MRI. We have been using silver alginate for about 2 months with no real change in his wounds. I am going to change to [facility] classic. He was advised that he needs to be sure that the dressing is the size of the wound and does not overlap onto his skin to avoid maceration. We will continue to use his Keystone topical antibiotic. Follow-up in 1 week. Electronic Signature(s) Signed: 10/25/2021 5:05:26 PM By: [name] MD FACS Entered By: [name] on 10/25/2021 17:05:26 -------------------------------------------------------------------------------- HxROS Details Patient Name: Date of Service: [name], [name] 10/25/2021 3:30 PM Medical Record Number: [id number] Patient Account Number: [id number] Date of Birth/Sex: Treating RN: 1985/06/07 (36 y.o. [name] Primary Care Provider: [name] Other  Clinician: Referring Provider: Treating Provider/Extender: [name] in Treatment: 104 Information Obtained From Patient Eyes Medical History: Negative for: Cataracts; Glaucoma; Optic Neuritis Ear/Nose/Mouth/Throat Medical History: Negative for: Chronic sinus problems/congestion; Middle ear problems Hematologic/Lymphatic Medical History: Negative for: Anemia; Hemophilia; Human Immunodeficiency Virus; Lymphedema; Sickle Cell Disease Respiratory Medical History: Negative for: Aspiration; Asthma; Chronic Obstructive Pulmonary Disease (COPD); Pneumothorax; Sleep Apnea; Tuberculosis Cardiovascular Medical History: Negative for: Angina; Arrhythmia; Congestive Heart Failure; Coronary Artery Disease; Deep Vein Thrombosis; Hypertension; Hypotension; Myocardial Infarction; Peripheral Arterial Disease; Peripheral Venous Disease; Phlebitis; Vasculitis Gastrointestinal Medical History: Negative for: Cirrhosis ; Colitis; Crohns; Hepatitis A; Hepatitis B; Hepatitis C Endocrine Medical History: Positive for: Type II Diabetes Negative for: Type I Diabetes Time with diabetes: 8 Treated with: Insulin Blood sugar tested every day: No Immunological Medical History: Negative for: Lupus Erythematosus; Raynauds; Scleroderma Integumentary (Skin) Medical History: Negative for: History of Burn Musculoskeletal Medical History: Negative for: Gout; Rheumatoid Arthritis; Osteoarthritis; Osteomyelitis Neurologic Medical History: Negative for: Dementia; Neuropathy; Quadriplegia; Paraplegia; Seizure Disorder Oncologic Medical History: Negative for: Received Chemotherapy; Received Radiation Psychiatric Medical History: Negative for: Anorexia/bulimia; Confinement Anxiety Immunizations Pneumococcal Vaccine: Received Pneumococcal Vaccination: No Implantable Devices None Hospitalization / Surgery History Type of Hospitalization/Surgery 11/1-11/06/2018- sepsis foot  infection 11/4-11/5 02 sats low respiratory distress Family and Social History Cancer: No; Diabetes: No; Hereditary Spherocytosis: No; Hypertension: No; Kidney Disease: No; Lung Disease: No; Seizures: No; Stroke: No; Thyroid Problems: No; Tuberculosis: No; Never smoker; Marital Status - Single; Alcohol Use: Rarely; Drug Use: No History; Caffeine Use: Never; Financial Concerns: No; Food, Clothing or Shelter Needs: No; Support System Lacking: No; Transportation Concerns: No Engineer, maintenance) Signed: 10/25/2021 5:07:36 PM By: [name] MD FACS Signed: 10/25/2021 5:44:10 PM By: Baruch Gouty RN, BSN Entered By: [name] on 10/25/2021 17:03:28 -------------------------------------------------------------------------------- SuperBill Details Patient Name: Date of Service: [name], [name] 10/25/2021 Medical Record Number: [id number] Patient Account Number: [id number] Date of Birth/Sex: Treating RN: 08-10-1985 (36 y.o. 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 Facility Procedures CPT4 Code: 60630160 Description: [place]  TISSUE 20 SQ CM/< ICD-10 Diagnosis Description L97.528 Non-pressure chronic ulcer of other part of left foot with other specified severi L97.518 Non-pressure chronic ulcer of other part of right foot with other specified  sever L97.322 Non-pressure chronic ulcer of left ankle with fat  layer exposed Modifier: ty ity Quantity: 1 CPT4 Code: 83151761 Description: 60737 - DEB SUBQ TISS EA ADDL 20CM ICD-10 Diagnosis Description L97.528 Non-pressure chronic ulcer of other part of left foot with other specified severi L97.518 Non-pressure chronic ulcer of other part of right foot with other specified  sever L97.322 Non-pressure chronic ulcer of left ankle with fat layer exposed Modifier: ty ity Quantity: 1 CPT4 Code: 10626948 Description: 54627 - DEBRIDE WOUND 1ST 20 SQ CM OR < ICD-10 Diagnosis Description 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 Modifier: Quantity: 1 Physician Procedures : CPT4 Code Description Modifier [id number] - WC PHYS LEVEL 4 - EST PT 25 ICD-10 Diagnosis Description E11.621 Type 2 diabetes mellitus with foot ulcer L97.528 Non-pressure chronic ulcer of other part of left foot with other specified severity L97.518  Non-pressure chronic ulcer of other part of right foot with other specified severity L97.322 Non-pressure chronic ulcer of left ankle with fat layer exposed Quantity: 1 : [id number] 11042 - WC PHYS SUBQ TISS 20 SQ CM ICD-10 Diagnosis Description L97.528 Non-pressure chronic ulcer of other part of left foot with other specified severity L97.518 Non-pressure chronic ulcer of other part of right foot with other specified  severity L97.322 Non-pressure chronic ulcer of left ankle with fat layer exposed Quantity: 1 : [id number] 11045 - WC PHYS SUBQ TISS EA ADDL 20 CM ICD-10 Diagnosis Description L97.528 Non-pressure chronic ulcer of other part of left foot with other specified severity L97.518 Non-pressure chronic ulcer of other part of right foot with other specified  severity L97.322 Non-pressure chronic ulcer of left ankle with fat layer exposed Quantity: 1 : [id number] 97597 - WC PHYS DEBR WO ANESTH 20 SQ CM ICD-10 Diagnosis Description L97.318 Non-pressure chronic ulcer of right ankle with other specified  severity Quantity: 1 Electronic Signature(s) Signed: 10/25/2021 5:06:12 PM By: [name] MD FACS Entered By: [name] on 10/25/2021 17:06:09

## 2021-10-26 ENCOUNTER — Encounter (HOSPITAL_COMMUNITY): Payer: Self-pay | Admitting: Emergency Medicine

## 2021-10-26 ENCOUNTER — Other Ambulatory Visit (HOSPITAL_COMMUNITY): Payer: Self-pay | Admitting: General Surgery

## 2021-10-26 ENCOUNTER — Ambulatory Visit (INDEPENDENT_AMBULATORY_CARE_PROVIDER_SITE_OTHER): Payer: BC Managed Care – PPO | Admitting: Podiatry

## 2021-10-26 ENCOUNTER — Encounter (HOSPITAL_COMMUNITY): Payer: BC Managed Care – PPO

## 2021-10-26 ENCOUNTER — Other Ambulatory Visit: Payer: Self-pay

## 2021-10-26 ENCOUNTER — Ambulatory Visit (INDEPENDENT_AMBULATORY_CARE_PROVIDER_SITE_OTHER): Payer: BC Managed Care – PPO

## 2021-10-26 ENCOUNTER — Inpatient Hospital Stay (HOSPITAL_COMMUNITY)
Admission: EM | Admit: 2021-10-26 | Discharge: 2021-10-31 | DRG: 623 | Disposition: A | Discharge status: Home / Self Care | Payer: BC Managed Care – PPO | Attending: Internal Medicine | Admitting: Internal Medicine

## 2021-10-26 DIAGNOSIS — E08621 Diabetes mellitus due to underlying condition with foot ulcer: Secondary | ICD-10-CM

## 2021-10-26 DIAGNOSIS — E11628 Type 2 diabetes mellitus with other skin complications: Principal | ICD-10-CM | POA: Diagnosis present

## 2021-10-26 DIAGNOSIS — M79675 Pain in left toe(s): Secondary | ICD-10-CM | POA: Diagnosis not present

## 2021-10-26 DIAGNOSIS — E1149 Type 2 diabetes mellitus with other diabetic neurological complication: Secondary | ICD-10-CM

## 2021-10-26 DIAGNOSIS — E11610 Type 2 diabetes mellitus with diabetic neuropathic arthropathy: Secondary | ICD-10-CM | POA: Diagnosis present

## 2021-10-26 DIAGNOSIS — M14671 Charcot's joint, right ankle and foot: Secondary | ICD-10-CM

## 2021-10-26 DIAGNOSIS — M86071 Acute hematogenous osteomyelitis, right ankle and foot: Secondary | ICD-10-CM

## 2021-10-26 DIAGNOSIS — M79674 Pain in right toe(s): Secondary | ICD-10-CM | POA: Diagnosis not present

## 2021-10-26 DIAGNOSIS — Z6839 Body mass index (BMI) 39.0-39.9, adult: Secondary | ICD-10-CM

## 2021-10-26 DIAGNOSIS — Z888 Allergy status to other drugs, medicaments and biological substances status: Secondary | ICD-10-CM

## 2021-10-26 DIAGNOSIS — Z89421 Acquired absence of other right toe(s): Secondary | ICD-10-CM

## 2021-10-26 DIAGNOSIS — E1065 Type 1 diabetes mellitus with hyperglycemia: Secondary | ICD-10-CM | POA: Diagnosis present

## 2021-10-26 DIAGNOSIS — L97421 Non-pressure chronic ulcer of left heel and midfoot limited to breakdown of skin: Secondary | ICD-10-CM

## 2021-10-26 DIAGNOSIS — E10610 Type 1 diabetes mellitus with diabetic neuropathic arthropathy: Secondary | ICD-10-CM | POA: Diagnosis present

## 2021-10-26 DIAGNOSIS — D638 Anemia in other chronic diseases classified elsewhere: Secondary | ICD-10-CM | POA: Diagnosis present

## 2021-10-26 DIAGNOSIS — Z9641 Presence of insulin pump (external) (internal): Secondary | ICD-10-CM | POA: Diagnosis present

## 2021-10-26 DIAGNOSIS — L089 Local infection of the skin and subcutaneous tissue, unspecified: Secondary | ICD-10-CM | POA: Diagnosis present

## 2021-10-26 DIAGNOSIS — E10621 Type 1 diabetes mellitus with foot ulcer: Secondary | ICD-10-CM

## 2021-10-26 DIAGNOSIS — E11621 Type 2 diabetes mellitus with foot ulcer: Secondary | ICD-10-CM

## 2021-10-26 DIAGNOSIS — Z79899 Other long term (current) drug therapy: Secondary | ICD-10-CM

## 2021-10-26 DIAGNOSIS — L97529 Non-pressure chronic ulcer of other part of left foot with unspecified severity: Secondary | ICD-10-CM | POA: Diagnosis present

## 2021-10-26 DIAGNOSIS — B351 Tinea unguium: Secondary | ICD-10-CM | POA: Diagnosis not present

## 2021-10-26 DIAGNOSIS — E10319 Type 1 diabetes mellitus with unspecified diabetic retinopathy without macular edema: Secondary | ICD-10-CM | POA: Diagnosis present

## 2021-10-26 DIAGNOSIS — L97415 Non-pressure chronic ulcer of right heel and midfoot with muscle involvement without evidence of necrosis: Secondary | ICD-10-CM | POA: Diagnosis present

## 2021-10-26 DIAGNOSIS — E10628 Type 1 diabetes mellitus with other skin complications: Principal | ICD-10-CM | POA: Diagnosis present

## 2021-10-26 DIAGNOSIS — E669 Obesity, unspecified: Secondary | ICD-10-CM | POA: Diagnosis present

## 2021-10-26 DIAGNOSIS — Z89422 Acquired absence of other left toe(s): Secondary | ICD-10-CM

## 2021-10-26 DIAGNOSIS — Z794 Long term (current) use of insulin: Secondary | ICD-10-CM

## 2021-10-26 DIAGNOSIS — E1036 Type 1 diabetes mellitus with diabetic cataract: Secondary | ICD-10-CM | POA: Diagnosis present

## 2021-10-26 DIAGNOSIS — M86471 Chronic osteomyelitis with draining sinus, right ankle and foot: Secondary | ICD-10-CM

## 2021-10-26 DIAGNOSIS — M79604 Pain in right leg: Secondary | ICD-10-CM

## 2021-10-26 DIAGNOSIS — D509 Iron deficiency anemia, unspecified: Secondary | ICD-10-CM | POA: Diagnosis present

## 2021-10-26 DIAGNOSIS — Z833 Family history of diabetes mellitus: Secondary | ICD-10-CM

## 2021-10-26 DIAGNOSIS — E1069 Type 1 diabetes mellitus with other specified complication: Secondary | ICD-10-CM | POA: Diagnosis present

## 2021-10-26 LAB — CBC WITH DIFFERENTIAL/PLATELET
Abs Immature Granulocytes: 0.02 10*3/uL (ref 0.00–0.07)
Basophils Absolute: 0 10*3/uL (ref 0.0–0.1)
Basophils Relative: 1 %
Eosinophils Absolute: 0.3 10*3/uL (ref 0.0–0.5)
Eosinophils Relative: 5 %
HCT: 36.1 % — ABNORMAL LOW (ref 39.0–52.0)
Hemoglobin: 10.9 g/dL — ABNORMAL LOW (ref 13.0–17.0)
Immature Granulocytes: 0 %
Lymphocytes Relative: 27 %
Lymphs Abs: 1.6 10*3/uL (ref 0.7–4.0)
MCH: 25.4 pg — ABNORMAL LOW (ref 26.0–34.0)
MCHC: 30.2 g/dL (ref 30.0–36.0)
MCV: 84.1 fL (ref 80.0–100.0)
Monocytes Absolute: 0.6 10*3/uL (ref 0.1–1.0)
Monocytes Relative: 10 %
Neutro Abs: 3.4 10*3/uL (ref 1.7–7.7)
Neutrophils Relative %: 57 %
Platelets: 309 10*3/uL (ref 150–400)
RBC: 4.29 MIL/uL (ref 4.22–5.81)
RDW: 15.2 % (ref 11.5–15.5)
WBC: 5.9 10*3/uL (ref 4.0–10.5)
nRBC: 0 % (ref 0.0–0.2)

## 2021-10-26 LAB — COMPREHENSIVE METABOLIC PANEL
ALT: 19 U/L (ref 0–44)
AST: 17 U/L (ref 15–41)
Albumin: 3.2 g/dL — ABNORMAL LOW (ref 3.5–5.0)
Alkaline Phosphatase: 123 U/L (ref 38–126)
Anion gap: 6 (ref 5–15)
BUN: 14 mg/dL (ref 6–20)
CO2: 26 mmol/L (ref 22–32)
Calcium: 9.2 mg/dL (ref 8.9–10.3)
Chloride: 106 mmol/L (ref 98–111)
Creatinine, Ser: 1.22 mg/dL (ref 0.61–1.24)
GFR, Estimated: >60 mL/min (ref >60)
Glucose, Bld: 161 mg/dL — ABNORMAL HIGH (ref 70–99)
Potassium: 4.5 mmol/L (ref 3.5–5.1)
Sodium: 138 mmol/L (ref 135–145)
Total Bilirubin: <0.1 mg/dL — ABNORMAL LOW (ref 0.3–1.2)
Total Protein: 7.5 g/dL (ref 6.5–8.1)

## 2021-10-26 LAB — LACTIC ACID, PLASMA: Lactic Acid, Venous: 1.4 mmol/L (ref 0.5–1.9)

## 2021-10-26 LAB — SEDIMENTATION RATE: Sed Rate: 45 mm/hr — ABNORMAL HIGH (ref 0–16)

## 2021-10-26 LAB — C-REACTIVE PROTEIN: CRP: 2 mg/dL — ABNORMAL HIGH (ref <1.0)

## 2021-10-26 NOTE — Progress Notes (Signed)
Subjective: 36 year old male presents the office today for diabetic foot exam, thick, elongated toes that he cannot trim himself.  He has had longstanding ulcerations he has been treated by the wound care center.  He has had swelling to the right foot and leg for about a month.  He had x-rays about 2 months ago which apparently were negative but they ordered an MRI which was denied by insurance.  Denies any fevers or chills.     Objective: AAO x3, NAD Large ulcerations present bilaterally.  In the right fifth metatarsal base 4 x 3 cm and right dorsal midfoot 1.5 x 1.5 cm.  The left midfoot 1 x 1 cm dorsally as well as the left fifth metatarsal base measuring 5 x 3.5 cm.  Fibrogranular wound base is present.  There is extensive edema present on the right foot, leg.  There is no areas of fluctuance or crepitation but the area is tense. Nails appear be hypertrophic, dystrophic yellow, brown discoloration nails 1-4 bilaterally.  No edema, erythema additional signs.   Assessment: Symptomatic onychomycosis; right lower extremity edema concern for osteomyelitis versus Charcot  Plan: -All treatment options discussed with the patient including all alternatives, risks, complications.  -Sharply debride the nails x8 without any complications or bleeding. -Given the swelling of the right foot I did obtain new x-rays today.  Compared to prior x-rays that were performed 2 months ago there is extensive cortical changes and irregularity present of the cuboid, fourth, fifth metatarsals proximally.  This could represent osteomyelitis given underlying ulceration versus Charcot. -Given the extensive swelling as well as concern for osteomyelitis and referred him to the emergency room for further evaluation likely admission to the hospital.  Upon admission patient is going to need MRI and potential bone biopsy.  If stable and blood work within normal limits consider holding antibiotics until bone biopsy. -I am concerned  that if the patient goes home we do this as an outpatient if there is infection he is at very high risk of limb loss even with Charcot as well.  Vivi Barrack DPM

## 2021-10-26 NOTE — ED Triage Notes (Signed)
Pt reports right foot swelling for the last four weeks. Per pts physician the xray today showed osteomyelitis. Pt is diabetic. Pt reports nonhealing wound to right lateral side of right foot. Pt reports taking two weeks of antibiotics for infection recently. VSS.

## 2021-10-26 NOTE — ED Provider Triage Note (Signed)
Emergency Medicine Provider Triage Evaluation Note  [name] , a 36 y.o. male  was evaluated in triage.  Pt complains of sent from podiatrist.  He was seen today by Dr. Ardelle Anton, per his note  "Plan: -All treatment options discussed with the patient including all alternatives, risks, complications.  -Sharply debride the nails x8 without any complications or bleeding. -Given the swelling of the right foot I did obtain new x-rays today.  Compared to prior x-rays that were performed 2 months ago there is extensive cortical changes and irregularity present of the cuboid, fourth, fifth metatarsals proximally.  This could represent osteomyelitis given underlying ulceration versus Charcot. -Given the extensive swelling as well as concern for osteomyelitis and referred him to the emergency room for further evaluation likely admission to the hospital.  Upon admission patient is going to need MRI and potential bone biopsy.  If stable and blood work within normal limits consider holding antibiotics until bone biopsy. -I am concerned that if the patient goes home we do this as an outpatient if there is infection he is at very high risk of limb loss even with Charcot as well."    Physical Exam  BP 122/70 (BP Location: Right Arm)   Pulse 88   Temp 98.8 F (37.1 C) (Oral)   Resp 18   Ht 6\' 5"  (1.956 m)   Wt (!) 149.7 kg   SpO2 95%   BMI 39.13 kg/m  Gen:   Awake, no distress   Resp:  Normal effort  MSK:   Moves extremities without difficulty  Other:  Right foot in splint  Medical Decision Making  Medically screening exam initiated at 8:04 PM.  Appropriate orders placed.  [name] was informed that the remainder of the evaluation will be completed by another provider, this initial triage assessment does not replace that evaluation, and the importance of remaining in the ED until their evaluation is complete.     Max, [name] 10/26/21 2006

## 2021-10-27 ENCOUNTER — Encounter (HOSPITAL_COMMUNITY): Payer: Self-pay | Admitting: Internal Medicine

## 2021-10-27 ENCOUNTER — Inpatient Hospital Stay (HOSPITAL_COMMUNITY): Payer: BC Managed Care – PPO | Admitting: Anesthesiology

## 2021-10-27 ENCOUNTER — Emergency Department (HOSPITAL_COMMUNITY): Payer: BC Managed Care – PPO

## 2021-10-27 ENCOUNTER — Other Ambulatory Visit: Payer: Self-pay

## 2021-10-27 ENCOUNTER — Encounter (HOSPITAL_COMMUNITY)
Admission: EM | Disposition: A | Discharge status: Home / Self Care | Payer: Self-pay | Source: Home / Self Care | Attending: Internal Medicine

## 2021-10-27 ENCOUNTER — Encounter (HOSPITAL_COMMUNITY): Payer: Self-pay

## 2021-10-27 ENCOUNTER — Ambulatory Visit (HOSPITAL_COMMUNITY): Payer: BC Managed Care – PPO

## 2021-10-27 DIAGNOSIS — Z6839 Body mass index (BMI) 39.0-39.9, adult: Secondary | ICD-10-CM | POA: Diagnosis not present

## 2021-10-27 DIAGNOSIS — M86471 Chronic osteomyelitis with draining sinus, right ankle and foot: Secondary | ICD-10-CM

## 2021-10-27 DIAGNOSIS — D509 Iron deficiency anemia, unspecified: Secondary | ICD-10-CM

## 2021-10-27 DIAGNOSIS — L97519 Non-pressure chronic ulcer of other part of right foot with unspecified severity: Secondary | ICD-10-CM

## 2021-10-27 DIAGNOSIS — E669 Obesity, unspecified: Secondary | ICD-10-CM

## 2021-10-27 DIAGNOSIS — M79671 Pain in right foot: Secondary | ICD-10-CM | POA: Diagnosis present

## 2021-10-27 DIAGNOSIS — Z888 Allergy status to other drugs, medicaments and biological substances status: Secondary | ICD-10-CM | POA: Diagnosis not present

## 2021-10-27 DIAGNOSIS — D638 Anemia in other chronic diseases classified elsewhere: Secondary | ICD-10-CM | POA: Diagnosis present

## 2021-10-27 DIAGNOSIS — Z89422 Acquired absence of other left toe(s): Secondary | ICD-10-CM | POA: Diagnosis not present

## 2021-10-27 DIAGNOSIS — L97415 Non-pressure chronic ulcer of right heel and midfoot with muscle involvement without evidence of necrosis: Secondary | ICD-10-CM

## 2021-10-27 DIAGNOSIS — L089 Local infection of the skin and subcutaneous tissue, unspecified: Secondary | ICD-10-CM

## 2021-10-27 DIAGNOSIS — E11628 Type 2 diabetes mellitus with other skin complications: Secondary | ICD-10-CM | POA: Diagnosis not present

## 2021-10-27 DIAGNOSIS — E1069 Type 1 diabetes mellitus with other specified complication: Secondary | ICD-10-CM | POA: Diagnosis present

## 2021-10-27 DIAGNOSIS — Z9641 Presence of insulin pump (external) (internal): Secondary | ICD-10-CM | POA: Diagnosis present

## 2021-10-27 DIAGNOSIS — R197 Diarrhea, unspecified: Secondary | ICD-10-CM

## 2021-10-27 DIAGNOSIS — E11610 Type 2 diabetes mellitus with diabetic neuropathic arthropathy: Secondary | ICD-10-CM | POA: Diagnosis present

## 2021-10-27 DIAGNOSIS — E10610 Type 1 diabetes mellitus with diabetic neuropathic arthropathy: Secondary | ICD-10-CM | POA: Diagnosis present

## 2021-10-27 DIAGNOSIS — E10319 Type 1 diabetes mellitus with unspecified diabetic retinopathy without macular edema: Secondary | ICD-10-CM | POA: Diagnosis present

## 2021-10-27 DIAGNOSIS — E1065 Type 1 diabetes mellitus with hyperglycemia: Secondary | ICD-10-CM | POA: Diagnosis present

## 2021-10-27 DIAGNOSIS — Z794 Long term (current) use of insulin: Secondary | ICD-10-CM | POA: Diagnosis not present

## 2021-10-27 DIAGNOSIS — L97425 Non-pressure chronic ulcer of left heel and midfoot with muscle involvement without evidence of necrosis: Secondary | ICD-10-CM | POA: Diagnosis not present

## 2021-10-27 DIAGNOSIS — E1036 Type 1 diabetes mellitus with diabetic cataract: Secondary | ICD-10-CM | POA: Diagnosis present

## 2021-10-27 DIAGNOSIS — E08628 Diabetes mellitus due to underlying condition with other skin complications: Secondary | ICD-10-CM | POA: Diagnosis not present

## 2021-10-27 DIAGNOSIS — Z79899 Other long term (current) drug therapy: Secondary | ICD-10-CM | POA: Diagnosis not present

## 2021-10-27 DIAGNOSIS — E10628 Type 1 diabetes mellitus with other skin complications: Secondary | ICD-10-CM | POA: Diagnosis present

## 2021-10-27 DIAGNOSIS — E10621 Type 1 diabetes mellitus with foot ulcer: Secondary | ICD-10-CM | POA: Diagnosis present

## 2021-10-27 DIAGNOSIS — E08621 Diabetes mellitus due to underlying condition with foot ulcer: Secondary | ICD-10-CM | POA: Diagnosis not present

## 2021-10-27 DIAGNOSIS — Z833 Family history of diabetes mellitus: Secondary | ICD-10-CM | POA: Diagnosis not present

## 2021-10-27 DIAGNOSIS — Z89421 Acquired absence of other right toe(s): Secondary | ICD-10-CM | POA: Diagnosis not present

## 2021-10-27 DIAGNOSIS — L97529 Non-pressure chronic ulcer of other part of left foot with unspecified severity: Secondary | ICD-10-CM | POA: Diagnosis present

## 2021-10-27 HISTORY — PX: WOUND DEBRIDEMENT: SHX247

## 2021-10-27 LAB — SURGICAL PCR SCREEN
MRSA, PCR: NEGATIVE
Staphylococcus aureus: NEGATIVE

## 2021-10-27 LAB — GLUCOSE, CAPILLARY
Glucose-Capillary: 156 mg/dL — ABNORMAL HIGH (ref 70–99)
Glucose-Capillary: 104 mg/dL — ABNORMAL HIGH (ref 70–99)
Glucose-Capillary: 81 mg/dL (ref 70–99)

## 2021-10-27 SURGERY — DEBRIDEMENT, WOUND
Anesthesia: General | Laterality: Right

## 2021-10-27 MED ORDER — ONDANSETRON HCL 4 MG/2ML IJ SOLN
INTRAMUSCULAR | Status: DC | PRN
  Administered 2021-10-27: 4 mg via INTRAVENOUS

## 2021-10-27 MED ORDER — GADOBUTROL 1 MMOL/ML IV SOLN
10.0000 mL | Freq: Once | INTRAVENOUS | Status: AC | PRN
  Administered 2021-10-27: 10 mL via INTRAVENOUS

## 2021-10-27 MED ORDER — HYDROCODONE-ACETAMINOPHEN 5-325 MG PO TABS: 1.0000 | ORAL_TABLET | Freq: Four times a day (QID) | ORAL | Status: DC | PRN

## 2021-10-27 MED ORDER — VANCOMYCIN HCL 1000 MG IV SOLR
INTRAVENOUS | Status: DC | PRN
  Administered 2021-10-27: 1000 mg

## 2021-10-27 MED ORDER — ACETAMINOPHEN 160 MG/5ML PO SOLN: 1000.0000 mg | Freq: Once | ORAL | Status: DC | PRN

## 2021-10-27 MED ORDER — VANCOMYCIN HCL 1500 MG/300ML IV SOLN
1500.0000 mg | Freq: Two times a day (BID) | INTRAVENOUS | Status: DC
  Administered 2021-10-28 – 2021-10-30 (×6): 1500 mg via INTRAVENOUS
  Filled 2021-10-27 (×8): qty 300

## 2021-10-27 MED ORDER — OXYCODONE HCL 5 MG PO TABS: 5.0000 mg | ORAL_TABLET | Freq: Once | ORAL | Status: DC | PRN

## 2021-10-27 MED ORDER — LACTATED RINGERS IV SOLN: INTRAVENOUS | Status: DC

## 2021-10-27 MED ORDER — VANCOMYCIN HCL IN DEXTROSE 1-5 GM/200ML-% IV SOLN
1000.0000 mg | INTRAVENOUS | Status: AC
  Administered 2021-10-27: 1000 mg via INTRAVENOUS
  Filled 2021-10-27: qty 200

## 2021-10-27 MED ORDER — SODIUM CHLORIDE 0.9% FLUSH
3.0000 mL | Freq: Two times a day (BID) | INTRAVENOUS | Status: DC
  Administered 2021-10-27 – 2021-10-31 (×7): 3 mL via INTRAVENOUS

## 2021-10-27 MED ORDER — 0.9 % SODIUM CHLORIDE (POUR BTL) OPTIME
TOPICAL | Status: DC | PRN
  Administered 2021-10-27: 1000 mL

## 2021-10-27 MED ORDER — PROPOFOL 10 MG/ML IV BOLUS
INTRAVENOUS | Status: DC | PRN
  Administered 2021-10-27: 200 mg via INTRAVENOUS

## 2021-10-27 MED ORDER — BUPIVACAINE HCL (PF) 0.5 % IJ SOLN
INTRAMUSCULAR | Status: AC
  Filled 2021-10-27: qty 30

## 2021-10-27 MED ORDER — CHLORHEXIDINE GLUCONATE 0.12 % MT SOLN
15.0000 mL | Freq: Once | OROMUCOSAL | Status: DC
  Filled 2021-10-27: qty 15

## 2021-10-27 MED ORDER — ACETAMINOPHEN 500 MG PO TABS: 1000.0000 mg | ORAL_TABLET | Freq: Once | ORAL | Status: DC | PRN

## 2021-10-27 MED ORDER — INSULIN ASPART 100 UNIT/ML IJ SOLN
0.0000 [IU] | Freq: Three times a day (TID) | INTRAMUSCULAR | Status: DC
  Administered 2021-10-28: 5 [IU] via SUBCUTANEOUS
  Administered 2021-10-28: 2 [IU] via SUBCUTANEOUS
  Administered 2021-10-28 – 2021-10-29 (×4): 3 [IU] via SUBCUTANEOUS
  Administered 2021-10-30: 2 [IU] via SUBCUTANEOUS
  Administered 2021-10-30 – 2021-10-31 (×2): 5 [IU] via SUBCUTANEOUS

## 2021-10-27 MED ORDER — PIPERACILLIN-TAZOBACTAM 3.375 G IVPB
3.3750 g | INTRAVENOUS | Status: AC
  Administered 2021-10-27: 3.375 g via INTRAVENOUS
  Filled 2021-10-27: qty 50

## 2021-10-27 MED ORDER — ALBUTEROL SULFATE (2.5 MG/3ML) 0.083% IN NEBU: 2.5000 mg | INHALATION_SOLUTION | Freq: Four times a day (QID) | RESPIRATORY_TRACT | Status: DC | PRN

## 2021-10-27 MED ORDER — ORAL CARE MOUTH RINSE: 15.0000 mL | Freq: Once | OROMUCOSAL | Status: DC

## 2021-10-27 MED ORDER — VANCOMYCIN HCL 1500 MG/300ML IV SOLN
1500.0000 mg | Freq: Once | INTRAVENOUS | Status: AC
  Administered 2021-10-27: 1500 mg via INTRAVENOUS
  Filled 2021-10-27 (×2): qty 300

## 2021-10-27 MED ORDER — ACETAMINOPHEN 10 MG/ML IV SOLN: 1000.0000 mg | Freq: Once | INTRAVENOUS | Status: DC | PRN

## 2021-10-27 MED ORDER — SODIUM CHLORIDE 0.9 % IR SOLN
Status: DC | PRN
  Administered 2021-10-27: 3000 mL

## 2021-10-27 MED ORDER — MIDAZOLAM HCL 2 MG/2ML IJ SOLN
INTRAMUSCULAR | Status: AC
  Filled 2021-10-27: qty 2

## 2021-10-27 MED ORDER — MIDAZOLAM HCL 2 MG/2ML IJ SOLN
INTRAMUSCULAR | Status: DC | PRN
  Administered 2021-10-27: 2 mg via INTRAVENOUS

## 2021-10-27 MED ORDER — PIPERACILLIN-TAZOBACTAM 3.375 G IVPB 30 MIN
3.3750 g | INTRAVENOUS | Status: DC
  Filled 2021-10-27: qty 50

## 2021-10-27 MED ORDER — ACETAMINOPHEN 650 MG RE SUPP: 650.0000 mg | Freq: Four times a day (QID) | RECTAL | Status: DC | PRN

## 2021-10-27 MED ORDER — INSULIN GLARGINE-YFGN 100 UNIT/ML ~~LOC~~ SOLN
30.0000 [IU] | Freq: Every day | SUBCUTANEOUS | Status: DC
  Administered 2021-10-28 – 2021-10-31 (×4): 30 [IU] via SUBCUTANEOUS
  Filled 2021-10-27 (×4): qty 0.3

## 2021-10-27 MED ORDER — INSULIN ASPART 100 UNIT/ML IJ SOLN
0.0000 [IU] | Freq: Every day | INTRAMUSCULAR | Status: DC
  Administered 2021-10-29: 2 [IU] via SUBCUTANEOUS
  Administered 2021-10-30: 3 [IU] via SUBCUTANEOUS

## 2021-10-27 MED ORDER — ACETAMINOPHEN 325 MG PO TABS: 650.0000 mg | ORAL_TABLET | Freq: Four times a day (QID) | ORAL | Status: DC | PRN

## 2021-10-27 MED ORDER — VANCOMYCIN HCL 1000 MG IV SOLR
INTRAVENOUS | Status: AC
  Filled 2021-10-27: qty 20

## 2021-10-27 MED ORDER — LIDOCAINE 2% (20 MG/ML) 5 ML SYRINGE
INTRAMUSCULAR | Status: DC | PRN
  Administered 2021-10-27: 80 mg via INTRAVENOUS

## 2021-10-27 MED ORDER — PIPERACILLIN-TAZOBACTAM 3.375 G IVPB
3.3750 g | Freq: Three times a day (TID) | INTRAVENOUS | Status: DC
  Administered 2021-10-28 – 2021-10-31 (×10): 3.375 g via INTRAVENOUS
  Filled 2021-10-27 (×12): qty 50

## 2021-10-27 MED ORDER — CHLORHEXIDINE GLUCONATE 0.12 % MT SOLN
OROMUCOSAL | Status: AC
  Administered 2021-10-27: 15 mL
  Filled 2021-10-27: qty 15

## 2021-10-27 MED ORDER — VANCOMYCIN HCL IN DEXTROSE 1-5 GM/200ML-% IV SOLN
1000.0000 mg | INTRAVENOUS | Status: DC
  Filled 2021-10-27: qty 200

## 2021-10-27 MED ORDER — FENTANYL CITRATE (PF) 250 MCG/5ML IJ SOLN
INTRAMUSCULAR | Status: AC
  Filled 2021-10-27: qty 5

## 2021-10-27 MED ORDER — PHENYLEPHRINE 80 MCG/ML (10ML) SYRINGE FOR IV PUSH (FOR BLOOD PRESSURE SUPPORT)
PREFILLED_SYRINGE | INTRAVENOUS | Status: DC | PRN
  Administered 2021-10-27 (×2): 80 ug via INTRAVENOUS
  Administered 2021-10-27 (×2): 160 ug via INTRAVENOUS
  Administered 2021-10-27: 80 ug via INTRAVENOUS
  Administered 2021-10-27: 160 ug via INTRAVENOUS
  Administered 2021-10-27: 80 ug via INTRAVENOUS

## 2021-10-27 MED ORDER — VANCOMYCIN HCL 1000 MG IV SOLR: 1000.0000 mg | Freq: Once | INTRAVENOUS | Status: DC

## 2021-10-27 MED ORDER — FENTANYL CITRATE (PF) 100 MCG/2ML IJ SOLN: 25.0000 ug | INTRAMUSCULAR | Status: DC | PRN

## 2021-10-27 MED ORDER — OXYCODONE HCL 5 MG/5ML PO SOLN: 5.0000 mg | Freq: Once | ORAL | Status: DC | PRN

## 2021-10-27 MED ORDER — PROPOFOL 10 MG/ML IV BOLUS
INTRAVENOUS | Status: AC
Start: 2021-10-27 — End: ?
  Filled 2021-10-27: qty 20

## 2021-10-27 MED ORDER — BUPIVACAINE HCL (PF) 0.5 % IJ SOLN
INTRAMUSCULAR | Status: DC | PRN
  Administered 2021-10-27: 12 mL

## 2021-10-27 MED ORDER — ENOXAPARIN SODIUM 80 MG/0.8ML IJ SOSY
75.0000 mg | PREFILLED_SYRINGE | Freq: Every day | INTRAMUSCULAR | Status: DC
  Administered 2021-10-27 – 2021-10-29 (×3): 75 mg via SUBCUTANEOUS
  Filled 2021-10-27 (×5): qty 0.75

## 2021-10-27 MED ORDER — FENTANYL CITRATE (PF) 250 MCG/5ML IJ SOLN
INTRAMUSCULAR | Status: DC | PRN
  Administered 2021-10-27 (×4): 25 ug via INTRAVENOUS

## 2021-10-27 SURGICAL SUPPLY — 41 items
BAG COUNTER SPONGE SURGICOUNT (BAG) ×2 IMPLANT
BNDG ELASTIC 4X5.8 VLCR STR LF (GAUZE/BANDAGES/DRESSINGS) IMPLANT
BNDG ELASTIC 6X5.8 VLCR STR LF (GAUZE/BANDAGES/DRESSINGS) ×1 IMPLANT
BNDG GAUZE DERMACEA FLUFF (GAUZE/BANDAGES/DRESSINGS) ×1
BNDG GAUZE DERMACEA FLUFF 4 (GAUZE/BANDAGES/DRESSINGS) IMPLANT
COVER SURGICAL LIGHT HANDLE (MISCELLANEOUS) ×2 IMPLANT
CUFF TOURN SGL QUICK 18X4 (TOURNIQUET CUFF) IMPLANT
CUFF TOURN SGL QUICK 34 (TOURNIQUET CUFF)
CUFF TRNQT CYL 34X4.125X (TOURNIQUET CUFF) IMPLANT
DRAPE U-SHAPE 47X51 STRL (DRAPES) ×2 IMPLANT
DRSG EMULSION OIL 3X3 NADH (GAUZE/BANDAGES/DRESSINGS) ×1 IMPLANT
ELECT CAUTERY BLADE 6.4 (BLADE) ×2 IMPLANT
ELECT REM PT RETURN 9FT ADLT (ELECTROSURGICAL) ×2
ELECTRODE REM PT RTRN 9FT ADLT (ELECTROSURGICAL) ×1 IMPLANT
GAUZE SPONGE 4X4 12PLY STRL (GAUZE/BANDAGES/DRESSINGS) ×1 IMPLANT
GLOVE BIO SURGEON STRL SZ7 (GLOVE) ×2 IMPLANT
GLOVE BIOGEL PI IND STRL 7.5 (GLOVE) ×1 IMPLANT
GLOVE BIOGEL PI INDICATOR 7.5 (GLOVE) ×1
GOWN STRL REUS W/ TWL LRG LVL3 (GOWN DISPOSABLE) ×2 IMPLANT
GOWN STRL REUS W/TWL LRG LVL3 (GOWN DISPOSABLE) ×2
KIT BASIN OR (CUSTOM PROCEDURE TRAY) ×2 IMPLANT
KIT TURNOVER KIT B (KITS) ×2 IMPLANT
MANIFOLD NEPTUNE II (INSTRUMENTS) ×2 IMPLANT
NDL BIOPSY JAMSHIDI 8X6 (NEEDLE) IMPLANT
NDL HYPO 25GX1X1/2 BEV (NEEDLE) IMPLANT
NEEDLE BIOPSY JAMSHIDI 8X6 (NEEDLE) ×2 IMPLANT
NEEDLE HYPO 25GX1X1/2 BEV (NEEDLE) IMPLANT
NS IRRIG 1000ML POUR BTL (IV SOLUTION) ×2 IMPLANT
PACK ORTHO EXTREMITY (CUSTOM PROCEDURE TRAY) ×2 IMPLANT
PAD ABD 8X10 STRL (GAUZE/BANDAGES/DRESSINGS) ×1 IMPLANT
PAD ARMBOARD 7.5X6 YLW CONV (MISCELLANEOUS) ×4 IMPLANT
PROBE DEBRIDE SONICVAC MISONIX (TIP) IMPLANT
SET CYSTO W/LG BORE CLAMP LF (SET/KITS/TRAYS/PACK) ×2 IMPLANT
SOL PREP POV-IOD 4OZ 10% (MISCELLANEOUS) ×4 IMPLANT
SUT ETHILON 3 0 PS 1 (SUTURE) ×2 IMPLANT
SUT MNCRL AB 3-0 PS2 18 (SUTURE) ×2 IMPLANT
SYR CONTROL 10ML LL (SYRINGE) IMPLANT
TOWEL GREEN STERILE (TOWEL DISPOSABLE) ×2 IMPLANT
TOWEL GREEN STERILE FF (TOWEL DISPOSABLE) ×2 IMPLANT
TUBE CONNECTING 12X1/4 (SUCTIONS) ×2 IMPLANT
YANKAUER SUCT BULB TIP NO VENT (SUCTIONS) ×2 IMPLANT

## 2021-10-27 NOTE — Anesthesia Preprocedure Evaluation (Signed)
Anesthesia Evaluation  Patient identified by MRN, date of birth, ID band Patient awake    Reviewed: Allergy & Precautions, NPO status , Patient's Chart, lab work & pertinent test results  History of Anesthesia Complications Negative for: history of anesthetic complications  Airway Mallampati: I  TM Distance: >3 FB Neck ROM: Full    Dental  (+) Teeth Intact, Dental Advisory Given,    Pulmonary neg shortness of breath, neg sleep apnea, neg COPD, neg recent URI,    breath sounds clear to auscultation       Cardiovascular negative cardio ROS   Rhythm:Regular     Neuro/Psych neg Seizures  Neuromuscular disease negative psych ROS   GI/Hepatic negative GI ROS, Neg liver ROS,   Endo/Other  diabetes, Poorly Controlled, Insulin DependentMorbid obesityLab Results      Component                Value               Date                      HGBA1C                   8.0 (H)             09/13/2021             Renal/GU Renal diseaseLab Results      Component                Value               Date                      CREATININE               1.22                10/26/2021                Musculoskeletal  (+) Arthritis , Diabetic Foot Infection   Abdominal   Peds  Hematology  (+) Blood dyscrasia, anemia , Lab Results      Component                Value               Date                      WBC                      5.9                 10/26/2021                HGB                      10.9 (L)            10/26/2021                HCT                      36.1 (L)            10/26/2021                MCV  84.1                10/26/2021                PLT                      309                 10/26/2021              Anesthesia Other Findings   Reproductive/Obstetrics                             Anesthesia Physical Anesthesia Plan  ASA: 3  Anesthesia Plan: General   Post-op  Pain Management:    Induction: Intravenous  PONV Risk Score and Plan: 2 and Ondansetron and Treatment may vary due to age or medical condition  Airway Management Planned: LMA and Oral ETT  Additional Equipment: None  Intra-op Plan:   Post-operative Plan: Extubation in OR  Informed Consent: I have reviewed the patients History and Physical, chart, labs and discussed the procedure including the risks, benefits and alternatives for the proposed anesthesia with the patient or authorized representative who has indicated his/her understanding and acceptance.     Dental advisory given  Plan Discussed with: CRNA  Anesthesia Plan Comments:         Anesthesia Quick Evaluation

## 2021-10-27 NOTE — Progress Notes (Signed)
CBG 104 - no coverage needed in short stay.

## 2021-10-27 NOTE — ED Notes (Signed)
Pt ambulatory to bathroom

## 2021-10-27 NOTE — Anesthesia Procedure Notes (Signed)
Procedure Name: LMA Insertion Date/Time: 10/27/2021 6:08 PM  Performed by: Katina Degree, CRNAPre-anesthesia Checklist: Patient identified, Emergency Drugs available, Suction available and Patient being monitored Patient Re-evaluated:Patient Re-evaluated prior to induction Oxygen Delivery Method: Circle system utilized Preoxygenation: Pre-oxygenation with 100% oxygen Induction Type: IV induction Ventilation: Mask ventilation without difficulty LMA: LMA inserted LMA Size: 5.0 Laser Tube: Cuffed inflated with minimal occlusive pressure - saline Placement Confirmation: positive ETCO2 and breath sounds checked- equal and bilateral Tube secured with: Tape Dental Injury: Teeth and Oropharynx as per pre-operative assessment

## 2021-10-27 NOTE — ED Provider Notes (Signed)
Care of patient assumed from Dr. Oletta Cohn at 8 AM.  This patient presents for foot pain.  He has chronic wounds which have been healing.  He was seen by his podiatrist yesterday and he was sent to the ED for concern of possible osteomyelitis.  Although is not currently on antibiotics, he did just recently complete a course of them at home.  MRI has been ordered.  Follow-up on results and initiate IV antibiotics and admission if studies show osteomyelitis.  If imaging is negative, he can discharge with outpatient follow-up. Physical Exam  BP 119/72   Pulse 91   Temp 97.6 F (36.4 C) (Oral)   Resp 15   Ht 6\' 5"  (1.956 m)   Wt (!) 149.7 kg   SpO2 97%   BMI 39.13 kg/m   Physical Exam Vitals and nursing note reviewed.  Constitutional:      General: He is not in acute distress.    Appearance: Normal appearance. He is well-developed. He is not ill-appearing, toxic-appearing or diaphoretic.  HENT:     Head: Normocephalic and atraumatic.     Right Ear: External ear normal.     Left Ear: External ear normal.     Nose: Nose normal.     Mouth/Throat:     Mouth: Mucous membranes are moist.     Pharynx: Oropharynx is clear.  Eyes:     Extraocular Movements: Extraocular movements intact.     Conjunctiva/sclera: Conjunctivae normal.  Cardiovascular:     Rate and Rhythm: Normal rate and regular rhythm.     Heart sounds: No murmur heard. Pulmonary:     Effort: Pulmonary effort is normal. No respiratory distress.  Abdominal:     Palpations: Abdomen is soft.     Tenderness: There is no abdominal tenderness.  Musculoskeletal:        General: Normal range of motion.     Cervical back: Normal range of motion and neck supple.  Skin:    General: Skin is warm and dry.     Capillary Refill: Capillary refill takes less than 2 seconds.     Coloration: Skin is not jaundiced or pale.  Neurological:     General: No focal deficit present.     Mental Status: He is alert and oriented to person, place, and  time.  Psychiatric:        Mood and Affect: Mood normal.        Behavior: Behavior normal.     Procedures  Procedures  ED Course / MDM    Medical Decision Making Amount and/or Complexity of Data Reviewed Radiology: ordered.  Risk Prescription drug management. Decision regarding hospitalization.   MRI of foot showed soft tissue infection/inflammation with possibility of osteomyelitis.  This was discussed with podiatrist on-call, Dr. .  Dr. Lilian Kapur will plan on bone biopsy later today.  Unfortunately, patient had eaten this morning.  Plan time of biopsy will be 6 PM.  Dr. Lilian Kapur requested that antibiotics be held until biopsy is able to be obtained.  He did request admission to medicine.  Patient was admitted to hospitalist for further management.       Lilian Kapur, MD 10/27/21 1026

## 2021-10-27 NOTE — Brief Op Note (Signed)
10/27/2021  6:54 PM  PATIENT:  [name]  36 y.o. male  PRE-OPERATIVE DIAGNOSIS:  Diabetic Foot Infection  POST-OPERATIVE DIAGNOSIS:  Diabetic Foot Infection  PROCEDURE:  Procedure(s): RIGHT FOOT DEBRIDEMENT WOUND AND BONE BIOPSY (Right)   SURGEON:  Surgeon(s) and Role:    * Klay Sobotka, Rachelle Hora, DPM - Primary   ASSISTANTS: none   ANESTHESIA:   general  EBL:  20cc   BLOOD ADMINISTERED:none  DRAINS: none   LOCAL MEDICATIONS USED:  MARCAINE    and Amount: 12 ml  SPECIMEN:  5th metatarsal, cuboid   DISPOSITION OF SPECIMEN:  path and micro  COUNTS:  YES  TOURNIQUET:  * No tourniquets in log *  DICTATION: .Note written in EPIC  PLAN OF CARE: Admit to inpatient   PATIENT DISPOSITION:  PACU - hemodynamically stable.   Delay start of Pharmacological VTE agent (>24hrs) due to surgical blood loss or risk of bleeding: not applicable

## 2021-10-27 NOTE — ED Provider Notes (Signed)
[facility] EMERGENCY DEPARTMENT Provider Note   CSN: [id number] Arrival date & time: 10/26/21  7510     History  Chief Complaint  Patient presents with   Foot Pain    [name] is a 36 y.o. male.  Patient presents to the emergency department for evaluation of pain and swelling of his right foot.  Patient was seen by podiatry yesterday and referred to the ED because of concern over possible       Home Medications Prior to Admission medications   Medication Sig Start Date End Date Taking? Authorizing Provider  Continuous Blood Gluc Sensor (FREESTYLE LIBRE 14 DAY SENSOR) MISC Inject 1 patch into the skin every 14 (fourteen) days.    [provider]  Continuous Blood Gluc Sensor MISC 1 each by Does not apply route as directed. Use as directed every 14 days. May dispense FreeStyle [employer] or similar. 06/30/17   Darlin Drop, DO  insulin aspart (NOVOLOG) 100 UNIT/ML injection Inject 0-15 Units into the skin 3 (three) times daily with meals. Patient taking differently: Inject 7-12 Units into the skin 3 (three) times daily as needed for high blood sugar (before meals, if BGL is 250 or greater). 07/01/17   Lonia Blood, MD  Insulin Disposable Pump (OMNIPOD DASH 5 PACK PODS) MISC Inject into the skin. 01/10/20   [provider]  insulin glargine (LANTUS SOLOSTAR) 100 UNIT/ML Solostar Pen Inject 40 Units into the skin daily before breakfast. Patient taking differently: Inject 40-80 Units into the skin daily before breakfast. 08/20/19   Gwyneth Sprout, MD  lidocaine (LIDODERM) 5 % Place 1 patch onto the skin daily. Remove & Discard patch within 12 hours or as directed by MD Patient not taking: Reported on 09/12/2021 03/30/21   Redwine, Madison A, PA-C  metoCLOPramide (REGLAN) 5 MG tablet Take 1 tablet (5 mg total) by mouth 4 (four) times daily -  before meals and at bedtime for 5 days, THEN 1 tablet (5 mg total) 3 (three) times daily  as needed for up to 25 days for vomiting or nausea. 09/14/21 10/14/21  Almon Hercules, MD  silver sulfADIAZINE (SILVADENE) 1 % cream Apply pea-sized amount to wound daily. Patient not taking: Reported on 09/12/2021 10/10/19   Park Liter, DPM      Allergies    Basaglar Stephanie Coup [insulin glargine], Metformin and related, and Trulicity [dulaglutide]    Review of Systems   Review of Systems  Physical Exam Updated Vital Signs BP (!) 110/56   Pulse 73   Temp 97.6 F (36.4 C) (Oral)   Resp 14   Ht 6\' 5"  (1.956 m)   Wt (!) 149.7 kg   SpO2 97%   BMI 39.13 kg/m  Physical Exam Vitals and nursing note reviewed.  Constitutional:      General: He is not in acute distress.    Appearance: He is well-developed.  HENT:     Head: Normocephalic and atraumatic.     Mouth/Throat:     Mouth: Mucous membranes are moist.  Eyes:     General: Vision grossly intact. Gaze aligned appropriately.     Extraocular Movements: Extraocular movements intact.     Conjunctiva/sclera: Conjunctivae normal.  Cardiovascular:     Rate and Rhythm: Normal rate and regular rhythm.     Pulses: Normal pulses.     Heart sounds: Normal heart sounds, S1 normal and S2 normal. No murmur heard.    No friction rub. No gallop.  Pulmonary:     Effort: Pulmonary effort is normal. No respiratory distress.     Breath sounds: Normal breath sounds.  Abdominal:     Palpations: Abdomen is soft.     Tenderness: There is no abdominal tenderness. There is no guarding or rebound.     Hernia: No hernia is present.  Musculoskeletal:        General: No swelling.     Cervical back: Full passive range of motion without pain, normal range of motion and neck supple. No pain with movement, spinous process tenderness or muscular tenderness. Normal range of motion.     Right lower leg: Edema present.     Left lower leg: No edema.  Skin:    General: Skin is warm and dry.     Capillary Refill: Capillary refill takes less than 2 seconds.      Findings: Wound (1.5 cm shallow ulceration dorsal aspect of proximal right foot; 2 cm x 4 cm ulcerated area on lateral aspect of right foot) present. No ecchymosis, erythema or lesion.  Neurological:     Mental Status: He is alert and oriented to person, place, and time.     GCS: GCS eye subscore is 4. GCS verbal subscore is 5. GCS motor subscore is 6.     Cranial Nerves: Cranial nerves 2-12 are intact.     Sensory: Sensation is intact.     Motor: Motor function is intact. No weakness or abnormal muscle tone.     Coordination: Coordination is intact.  Psychiatric:        Mood and Affect: Mood normal.        Speech: Speech normal.        Behavior: Behavior normal.     ED Results / Procedures / Treatments   Labs (all labs ordered are listed, but only abnormal results are displayed) Labs Reviewed  COMPREHENSIVE METABOLIC PANEL - Abnormal; Notable for the following components:      Result Value   Glucose, Bld 161 (*)    Albumin 3.2 (*)    Total Bilirubin <0.1 (*)    All other components within normal limits  CBC WITH DIFFERENTIAL/PLATELET - Abnormal; Notable for the following components:   Hemoglobin 10.9 (*)    HCT 36.1 (*)    MCH 25.4 (*)    All other components within normal limits  SEDIMENTATION RATE - Abnormal; Notable for the following components:   Sed Rate 45 (*)    All other components within normal limits  C-REACTIVE PROTEIN - Abnormal; Notable for the following components:   CRP 2.0 (*)    All other components within normal limits  CULTURE, BLOOD (ROUTINE X 2)  CULTURE, BLOOD (ROUTINE X 2)  LACTIC ACID, PLASMA    EKG None  Radiology No results found.  Procedures Procedures    Medications Ordered in ED Medications - No data to display  ED Course/ Medical Decision Making/ A&P                           Medical Decision Making Amount and/or Complexity of Data Reviewed Radiology: ordered.   Patient sent to the emergency department because of concern for  possible osteomyelitis of the right foot.  Patient has had swelling and wounds of his foot for more than a month.  He has been on antibiotics previously.  Patient indicates that both wounds are improving.  He has good granulation tissue, no surrounding erythema, no purulent drainage.  The wounds actually appear fairly good.  I am not certain that he requires hospitalization at this time.  We will perform MRI to evaluate for possible deeper infection including osteomyelitis.  If present, will require admission for management.  Will sign out to oncoming ER physician.        Final Clinical Impression(s) / ED Diagnoses Final diagnoses:  Diabetic foot infection [facility])    Rx / DC Orders ED Discharge Orders     None         Lanny Donoso, Canary Brim, MD 10/27/21 [id number]

## 2021-10-27 NOTE — Progress Notes (Signed)
Inpatient Diabetes Program Recommendations  AACE/ADA: New Consensus Statement on Inpatient Glycemic Control (2015)  Target Ranges:  Prepandial:   less than 140 mg/dL      Peak postprandial:   less than 180 mg/dL (1-2 hours)      Critically ill patients:  140 - 180 mg/dL   Lab Results  Component Value Date   GLUCAP 156 (H) 10/27/2021   HGBA1C 8.0 (H) 09/13/2021    Review of Glycemic Control  Latest Reference Range & Units 10/27/21 13:16  Glucose-Capillary 70 - 99 mg/dL 161 (H)   Diabetes history: DM  Outpatient Diabetes medications: Lantus 40 units bid, Novolog (sometimes) Current orders for Inpatient glycemic control:  Novolog moderate tid with meals and HS Semglee 30 units daily  Inpatient Diabetes Program Recommendations:    Spoke with patient by phone.  He states he takes Lantus based on his fasting blood sugars.  He usually takes approximately 40 units bid.  If blood sugar is <70 mg/dL, he takes no basal and if <110, then he only takes Lantus 30 units.  He wears a Freestyle Libre glucose monitor.  A1C is much better then it was in 2021.  He states that A1C has been as good as 7.1% however blood sugars tended to increase with antibiotics.  Will follow while in the hospital. Patient see's Dr. Sharl Ma for DM management and states that he was never started on insulin pump.   Will follow.   Thanks,  Beryl Meager, RN, BC-ADM Inpatient Diabetes Coordinator Pager [phone]  (8a-5p)

## 2021-10-27 NOTE — ED Notes (Signed)
Patient transported to MRI 

## 2021-10-27 NOTE — Progress Notes (Signed)
Pharmacy Antibiotic Note  [name] is a 36 y.o. male admitted on 10/26/2021 with diabetic foot infection.  Pharmacy has been consulted for vancomycin and zosyn dosing. Pt received vancomycin 1500 mg IV for surgical prophylaxis. Renal function appears to be at baseline.   Plan: Give vancomycin 1,000 mg IV x1 to complete load, then vancomycin 1500 mg IV q12h (eAUC 431.9, Scr 1.22) Zosyn 3.375 g IV x1 while in PACU, then Zosyn 3.375 g Q8h EI  Monitor cultures, s/sx infection, CBC, renal function   Height: 6\' 5"  (195.6 cm) Weight: (!) 149.7 kg (330 lb) IBW/kg (Calculated) : 89.1  Temp (24hrs), Avg:98 F (36.7 C), Min:97.4 F (36.3 C), Max:98.8 F (37.1 C)  Recent Labs  Lab 10/26/21 2027 10/26/21 2028  WBC 5.9  --   CREATININE 1.22  --   LATICACIDVEN  --  1.4    Estimated Creatinine Clearance: 135.4 mL/min (by C-G formula based on SCr of 1.22 mg/dL).    Allergies  Allergen Reactions   Basaglar Kwikpen [Insulin Glargine] Diarrhea   Metformin And Related Diarrhea and Nausea And Vomiting   Trulicity [Dulaglutide] Diarrhea    Antimicrobials this admission: Vancomycin 7/26  >>  Zosyn 7/26 >>   Dose adjustments this admission:   Microbiology results: 7/25 BCx: NGTD 7/26 bone biopsy Cx: sent   Thank you for allowing pharmacy to be a part of this patient's care.  8/26, PharmD PGY1 Pharmacy Resident   10/27/2021  7:21 PM

## 2021-10-27 NOTE — H&P (Addendum)
History and Physical    Patient: [name] QQP:[id number] DOB: 02-Jul-1985 DOA: 10/26/2021 DOS: the patient was seen and examined on 10/27/2021 PCP: [name], MD  Patient coming from: Home  Chief Complaint:  Chief Complaint  Patient presents with   Foot Pain   HPI: [name] is a 36 y.o. 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  He just recently been hospitalized in 6/11 -6/13 for nausea and vomiting with concern for gastroenteritis, but gastroparesis could not be fully ruled out.  After leaving the hospital patient reported that he developed swelling of the right foot.  He has been being followed by Dr. [name] and wound care.  They had made several attempts to obtain MRI of the foot for further evaluation, but had been denied by his insurance.  He has been on 2 antibiotics and a cream which she just completed about a week and a half ago.  He had been referred to Dr. Earleen Newport of podiatry and seen yesterday.  X-rays in the office were concerning for osteomyelitis given underlying ulceration versus Charcot.  Patient was advised to come to emergency department for further evaluation.  He reports that he has some feeling present in his feet, and has not had any significant pain.  Patient does report having intermittent sweats at night which usually means he has an infection.  Since being on antibiotics patient reports that his blood sugars have been intermittently high, but he had also had 6-7 lows due to forgetting to eat.  Patient also had reported to nursing about having watery diarrhea with abdominal cramping for the last couple days  Upon admission into the emergency department patient was noted to be afebrile with stable vital signs.  Labs significant for  hemoglobin 10.9 g/dL, CRP 2, ESR 45, and lactic acid 1.4.  Blood cultures have been obtained.  MRI of the foot noted over the lateral midfoot wound with extensive surrounding inflammatory changes for which concern for soft tissue infection noted with osteomyelitis is not excluded, extensive abnormality in second and fifth metatarsal bases, cuboid, middle, and lateral cuneiforms.  Podiatry was consulted and plan to take the patient to the operating room for further evaluation.   Review of Systems: As mentioned in the history of present illness. All other systems reviewed and are negative. Past Medical History:  Diagnosis Date   Diabetes mellitus    Osteomyelitis [facility])    Past Surgical History:  Procedure Laterality Date   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;  Surgeon: Wylene Simmer, MD;  Location: [place];  Service: Orthopedics;  Laterality: Left;   AMPUTATION Right 10/22/2020   Procedure: Right Fifth ray amputation;  Surgeon: Wylene Simmer, MD;  Location: [place];  Service: Orthopedics;  Laterality: Right;   WOUND DEBRIDEMENT Bilateral 10/02/2019   Procedure: DEBRIDEMENT WOUNDS BOTH LOWER EXTREMITIES WITH BONE BIOPSIES;  Surgeon: Trula Slade, DPM;  Location: [place];  Service: Podiatry;  Laterality: Bilateral;   Social History:  reports that he has never smoked. He has never used smokeless tobacco. He reports that he does not drink alcohol and does not use drugs.  Allergies  Allergen Reactions   Basaglar Kwikpen [Insulin Glargine] Diarrhea   Metformin And Related Diarrhea and Nausea And Vomiting   Trulicity [Dulaglutide]  Diarrhea    Family History  Problem Relation Age of Onset   Hypertension Other    Diabetes Other    Healthy Mother    Colon cancer Neg Hx    Stomach cancer Neg Hx    Pancreatic cancer Neg Hx    Esophageal cancer Neg Hx     Prior to Admission medications   Medication Sig Start Date End Date  Taking? Authorizing Provider  Continuous Blood Gluc Sensor (FREESTYLE LIBRE 14 DAY SENSOR) MISC Inject 1 patch into the skin every 14 (fourteen) days.    [provider]  Continuous Blood Gluc Sensor MISC 1 each by Does not apply route as directed. Use as directed every 14 days. May dispense FreeStyle [employer] or similar. 06/30/17   Kayleen Memos, DO  insulin aspart (NOVOLOG) 100 UNIT/ML injection Inject 0-15 Units into the skin 3 (three) times daily with meals. Patient taking differently: Inject 7-12 Units into the skin 3 (three) times daily as needed for high blood sugar (before meals, if BGL is 250 or greater). 07/01/17   Cherene Altes, MD  Insulin Disposable Pump (OMNIPOD DASH 5 PACK PODS) MISC Inject into the skin. 01/10/20   [provider]  insulin glargine (LANTUS SOLOSTAR) 100 UNIT/ML Solostar Pen Inject 40 Units into the skin daily before breakfast. Patient taking differently: Inject 40-80 Units into the skin daily before breakfast. 08/20/19   Blanchie Dessert, MD  lidocaine (LIDODERM) 5 % Place 1 patch onto the skin daily. Remove & Discard patch within 12 hours or as directed by MD Patient not taking: Reported on 09/12/2021 03/30/21   Redwine, Madison A, PA-C  metoCLOPramide (REGLAN) 5 MG tablet Take 1 tablet (5 mg total) by mouth 4 (four) times daily -  before meals and at bedtime for 5 days, THEN 1 tablet (5 mg total) 3 (three) times daily as needed for up to 25 days for vomiting or nausea. 09/14/21 10/14/21  Mercy Riding, MD  silver sulfADIAZINE (SILVADENE) 1 % cream Apply pea-sized amount to wound daily. Patient not taking: Reported on 09/12/2021 10/10/19   Evelina Bucy, DPM    Physical Exam: Vitals:   10/27/21 6811 10/27/21 0743 10/27/21 0745 10/27/21 0937  BP: (!) 120/101  119/72   Pulse: 98 94 91   Resp: (!) _0 Temp:    98.7 F (37.1 C)  TempSrc:    Oral  SpO2: 100% 99% 97%   Weight:      Height:       Exam  Constitutional:  Middle-age male currently in no acute distress Eyes: PERRL, lids and conjunctivae normal ENMT: Mucous membranes are moist.   Neck: normal, supple,  Respiratory: clear to auscultation bilaterally, no wheezing, no crackles. Normal respiratory effort. No accessory muscle use.  Cardiovascular: Regular rate and rhythm, no murmurs / rubs / gallops.  Lower extremity swelling with compression stockings in place on the left leg Abdomen: no tenderness, no masses palpated. No hepatosplenomegaly. Bowel sounds positive.  Musculoskeletal: no clubbing / cyanosis.  Ray amputation of the bilateral feet Skin: Deferred as currently wrapped Neurologic: CN 2-12 grossly intact.  Strength 5/5 in all 4.  Psychiatric: Normal judgment and insight. Alert and oriented x 3. Normal mood.   Data Reviewed:  Reviewed labs, imaging, and pertinent record as noted above in HPI  Assessment and Plan: Diabetic foot infection Charcot foot due to diabetes Acute on chronic.Patient presents for worsening swelling and wound of the right foot.  Prior history of osteomyelitis requiring bilateral ray amputations.  He had been on antibiotics without improvement in symptoms.  Attempts made by wound care to obtain MRI been denied by insurance in the outpatient setting.  Yesterday patient seen by Dr. Earleen Newport podiatry for which x-rays were obtained and noted concern for osteomyelitis.  Labs significant for CRP 2 and ESR 45.  MRI showed extensive abnormality in the second through fifth metatarsal bones, cuboid, middle, and lateral cuneiforms suspicious of aggressive Charcot joint but underlying osteomyelitis could be present as well.  Podiatry notified and plan on taking the patient to the operating room for further evaluation. -Admit to MedSurg bed -N.p.o. for need of procedure -Follow-up operative cultures once able to be obtained -Holding off empiric antibiotics at this time until surgery -Appreciate podiatry consultative services, we will  follow-up for further recommendation  Uncontrolled diabetes mellitus type I On admission glucose 161.  Patient's last hemoglobin A1c 8 on 09/13/2021. Medication regimen includes currently Lantus 40-80 units daily with meals. -Hypoglycemic protocols -Pharmacy substitution of Semglee reduced to 30 units -CBGs before every meal with moderate SSI -Diabetic education consulted  Diarrhea Patient reported having diarrhea for the last 2 days.   -Check C. difficile and diarrhea sent antibiotic  Hypochromic anemia Chronic.  Hemoglobin 10.9 g/dL with MCH 25.4 which appears around baseline. -Continue to monitor  Obesity BMI 39.13 kg/m   Advance Care Planning:   Code Status: Full Code   Consults: Podiatry  Family Communication: None requested  Severity of Illness: The appropriate patient status for this patient is INPATIENT. Inpatient status is judged to be reasonable and necessary in order to provide the required intensity of service to ensure the patient's safety. The patient's presenting symptoms, physical exam findings, and initial radiographic and laboratory data in the context of their chronic comorbidities is felt to place them at high risk for further clinical deterioration. Furthermore, it is not anticipated that the patient will be medically stable for discharge from the hospital within 2 midnights of admission.   * I certify that at the point of admission it is my clinical judgment that the patient will require inpatient hospital care spanning beyond 2 midnights from the point of admission due to high intensity of service, high risk for further deterioration and high frequency of surveillance required.*  Author: Norval Morton, MD 10/27/2021 10:27 AM  For on call review [email or web address].

## 2021-10-27 NOTE — H&P (Signed)
History and Physical Interval Note:  10/27/2021 5:55 PM  [name]  has presented today for surgery, with the diagnosis of ulcer of right foot, osteomyelitis.  The various methods of treatment have been discussed with the patient and family. After consideration of risks, benefits and other options for treatment, the patient has consented to   Procedure(s): RIGHT FOOT DEBRIDEMENT WOUND AND BONE BIOPSY (Right) as a surgical intervention.  The patient's history has been reviewed, patient examined, no change in status, stable for surgery.  I have reviewed the patient's chart and labs.  Questions were answered to the patient's satisfaction.     Edwin Cap

## 2021-10-27 NOTE — Transfer of Care (Signed)
Immediate Anesthesia Transfer of Care Note  Patient: [name]  Procedure(s) Performed: RIGHT FOOT DEBRIDEMENT WOUND AND BONE BIOPSY (Right)  Patient Location: PACU  Anesthesia Type:General  Level of Consciousness: drowsy  Airway & Oxygen Therapy: Patient Spontanous Breathing and Patient connected to face mask oxygen  Post-op Assessment: Report given to RN and Post -op Vital signs reviewed and stable  Post vital signs: Reviewed and stable  Last Vitals:  Vitals Value Taken Time  BP 122/90 10/27/21 1900  Temp    Pulse 80 10/27/21 1904  Resp 17 10/27/21 1904  SpO2 97 % 10/27/21 1904  Vitals shown include unvalidated device data.  Last Pain:  Vitals:   10/27/21 1720  TempSrc: Oral  PainSc:          Complications: No notable events documented.

## 2021-10-28 ENCOUNTER — Encounter (HOSPITAL_COMMUNITY): Payer: Self-pay | Admitting: Podiatry

## 2021-10-28 DIAGNOSIS — L089 Local infection of the skin and subcutaneous tissue, unspecified: Secondary | ICD-10-CM | POA: Diagnosis not present

## 2021-10-28 DIAGNOSIS — E08621 Diabetes mellitus due to underlying condition with foot ulcer: Secondary | ICD-10-CM | POA: Diagnosis not present

## 2021-10-28 DIAGNOSIS — E11628 Type 2 diabetes mellitus with other skin complications: Secondary | ICD-10-CM

## 2021-10-28 DIAGNOSIS — M86471 Chronic osteomyelitis with draining sinus, right ankle and foot: Secondary | ICD-10-CM | POA: Diagnosis not present

## 2021-10-28 DIAGNOSIS — E11610 Type 2 diabetes mellitus with diabetic neuropathic arthropathy: Secondary | ICD-10-CM | POA: Diagnosis not present

## 2021-10-28 LAB — BASIC METABOLIC PANEL
Anion gap: 6 (ref 5–15)
BUN: 11 mg/dL (ref 6–20)
CO2: 27 mmol/L (ref 22–32)
Calcium: 8.8 mg/dL — ABNORMAL LOW (ref 8.9–10.3)
Chloride: 105 mmol/L (ref 98–111)
Creatinine, Ser: 1.05 mg/dL (ref 0.61–1.24)
GFR, Estimated: >60 mL/min (ref >60)
Glucose, Bld: 172 mg/dL — ABNORMAL HIGH (ref 70–99)
Potassium: 4.4 mmol/L (ref 3.5–5.1)
Sodium: 138 mmol/L (ref 135–145)

## 2021-10-28 LAB — CBC
HCT: 32.6 % — ABNORMAL LOW (ref 39.0–52.0)
Hemoglobin: 9.9 g/dL — ABNORMAL LOW (ref 13.0–17.0)
MCH: 25.4 pg — ABNORMAL LOW (ref 26.0–34.0)
MCHC: 30.4 g/dL (ref 30.0–36.0)
MCV: 83.6 fL (ref 80.0–100.0)
Platelets: 273 10*3/uL (ref 150–400)
RBC: 3.9 MIL/uL — ABNORMAL LOW (ref 4.22–5.81)
RDW: 15.2 % (ref 11.5–15.5)
WBC: 5.3 10*3/uL (ref 4.0–10.5)
nRBC: 0 % (ref 0.0–0.2)

## 2021-10-28 LAB — GLUCOSE, CAPILLARY
Glucose-Capillary: 75 mg/dL (ref 70–99)
Glucose-Capillary: 208 mg/dL — ABNORMAL HIGH (ref 70–99)
Glucose-Capillary: 180 mg/dL — ABNORMAL HIGH (ref 70–99)
Glucose-Capillary: 125 mg/dL — ABNORMAL HIGH (ref 70–99)
Glucose-Capillary: 162 mg/dL — ABNORMAL HIGH (ref 70–99)

## 2021-10-28 MED ORDER — CADEXOMER IODINE 0.9 % EX GEL
CUTANEOUS | Status: AC
  Filled 2021-10-28: qty 40

## 2021-10-28 NOTE — Consult Note (Signed)
WOC Nurse Consult Note: Patient receiving care in [facility] 2W14. Reason for Consult: "wound of right foot" Wound type: surgical debridement and bone biopsy by Dr. Andria Meuse, Podiatrist on 10/27/21. Please refer all questions pertaining to the wound of the right foot to Dr. Lilian Kapur.  WOC nurse will not follow at this time.  Please re-consult the WOC team if needed.  Helmut Muster, RN, MSN, CWOCN, CNS-BC, pager [phone]

## 2021-10-28 NOTE — Inpatient Diabetes Management (Signed)
Inpatient Diabetes Program Recommendations  AACE/ADA: New Consensus Statement on Inpatient Glycemic Control (2015)  Target Ranges:  Prepandial:   less than 140 mg/dL      Peak postprandial:   less than 180 mg/dL (1-2 hours)      Critically ill patients:  140 - 180 mg/dL   Lab Results  Component Value Date   GLUCAP 208 (H) 10/28/2021   HGBA1C 8.0 (H) 09/13/2021    Review of Glycemic Control  Latest Reference Range & Units 10/27/21 13:16 10/27/21 16:55 10/27/21 19:01 10/27/21 20:49 10/28/21 07:53  Glucose-Capillary 70 - 99 mg/dL 132 (H) 440 (H) 81 75 102 (H)  (H): Data is abnormally high  Diabetes history: DM2 Outpatient Diabetes medications: Lantus 40-80 units qd (does not take regularly when CBGs are running lower) Current orders for Inpatient glycemic control: Semglee 30 units qd, Novolog 0-15 units tid correction + hs 0-5 units  Inpatient Diabetes Program Recommendations:    Spoke with patient @ bedside regarding diabetes management. Patient states his CBGs are lower on some days and he does not take his Lantus due to hypoglycemia. Reviewed with patient to discuss libre readings with Dr. Sharl Ma @ appointments and review dosages of insulin needed. Patient missed appointment with Dr. Sharl Ma in June due to being in the hospital and plans to call for another appointment. 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  Will follow during hospitalization.  Thank you, [name]. Max Luhmann, RN, MSN, CDE  Diabetes Coordinator Inpatient Glycemic Control Team Team Pager [phone] (8am-5pm) 10/28/2021 10:43 AM

## 2021-10-28 NOTE — Progress Notes (Signed)
PROGRESS NOTE    [name]  VBT:[id number] DOB: 1985-06-24 DOA: 10/26/2021 PCP: [name], MD   Brief Narrative:  Max Berkland is a 36 y.o. 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  Recently been hospitalized in 6/11 -6/13 for possible gastroenteritis. Post hospitalization patient noted worsening right foot swelling with difficulty obtaining MRI due to insurance denial ultimately placed on antibiotics with worsening pain/swelling who now presents with MRI imaging showing soft tissue infection and possible osteomyelitis.  Assessment & Plan:   Principal Problem:   Diabetic foot infection ([place]) Active Problems:   Charcot foot due to diabetes mellitus ([place])   Uncontrolled DM-1 with hyperglycemia, retinopathy, cataract and b/l feet ulcer   Hypochromic anemia   Obesity (BMI 30-39.9)   Diabetic foot infection, POA (does not meet sepsis criteria) Charcot foot due to diabetes -Failure of outpatient antibiotics(unspecified) -CRP 2 and ESR 45 at intake.   -MRI showed extensive abnormality in the second through fifth metatarsal bones, cuboid, middle, and lateral cuneiforms suspicious of aggressive Charcot joint but underlying osteomyelitis could be present as well.   -Podiatry following, status post right foot debridement and bone biopsy, cultures pending -Vanc/zosyn ongoing   Uncontrolled diabetes mellitus type I, controlled A1c 8 on 09/13/2021. Home regimen: Lantus 40-80 units daily with meals. -Continue Semglee 30 units daily, sliding scale insulin, hypoglycemic protocol   Diarrhea, transient, resolved -Patient reported having diarrhea 2 days prior to admission -C. difficile screening canceled, patient has not had a bowel movement in over 12 hours -this is  not consistent with C. difficile infection   Chronic anemia of chronic disease -Continue to monitor   Obesity BMI 39.13 kg/m   DVT prophylaxis: Lovenox Code Status: Full Family Communication: None present  Status is: Inpatient  Dispo: The patient is from: Home              Anticipated d/c is to: Home              Anticipated d/c date is: 48 to 72 hours              Patient currently not medically stable for discharge  Consultants:  Podiatry  Procedures:  Right foot wound debridement and bone biopsy  Antimicrobials:  Vancomycin, Zosyn  Subjective: No acute issues or events overnight, pain currently well controlled denies nausea vomiting diarrhea constipation headache fevers chills or chest pain  Objective: Vitals:   10/27/21 1945 10/27/21 1958 10/28/21 0000 10/28/21 0400  BP: 138/75 122/71 128/78 120/88  Pulse: 78 79 72 72  Resp: 16  13   Temp: 97.8 F (36.6 C) (!) 97.5 F (36.4 C) 97.6 F (36.4 C) 97.6 F (36.4 C)  TempSrc:  Oral Oral Oral  SpO2: 95% 99% 99% 100%  Weight:      Height:        Intake/Output Summary (Last 24 hours) at 10/28/2021 0727 Last data filed at 10/28/2021 0600 Gross per 24 hour  Intake 1753.82 ml  Output 610 ml  Net 1143.82 ml   Filed Weights   10/26/21 1951  Weight: (!) 149.7 kg    Examination:  General:  Pleasantly resting in bed, No acute distress. HEENT:  Normocephalic atraumatic.  Sclerae nonicteric, noninjected.  Extraocular movements intact bilaterally. Neck:  Without mass or deformity.  Trachea is midline. Lungs:  Clear to auscultate bilaterally without rhonchi, wheeze, or rales. Heart:  Regular rate and rhythm.  Without murmurs, rubs, or gallops. Abdomen: Obese, soft, nontender, nondistended.  Without guarding or rebound. Extremities: Bilateral foot and ankle dressings, notable ray amputations of the fifth tarsal and metatarsal bilaterally Vascular:  Dorsalis pedis and posterior tibial pulses weakly palpable  bilaterally. Skin:  Warm and dry, no erythema, no ulcerations.   Data Reviewed: I have personally reviewed following labs and imaging studies  CBC: Recent Labs  Lab 10/26/21 2027 10/28/21 0301  WBC 5.9 5.3  NEUTROABS 3.4  --   HGB 10.9* 9.9*  HCT 36.1* 32.6*  MCV 84.1 83.6  PLT 309 219   Basic Metabolic Panel: Recent Labs  Lab 10/26/21 2027 10/28/21 0301  NA 138 138  K 4.5 4.4  CL 106 105  CO2 26 27  GLUCOSE 161* 172*  BUN 14 11  CREATININE 1.22 1.05  CALCIUM 9.2 8.8*   GFR: Estimated Creatinine Clearance: 157.4 mL/min (by C-G formula based on SCr of 1.05 mg/dL). Liver Function Tests: Recent Labs  Lab 10/26/21 2027  AST 17  ALT 19  ALKPHOS 123  BILITOT <0.1*  PROT 7.5  ALBUMIN 3.2*   No results for input(s): "LIPASE", "AMYLASE" in the last 168 hours. No results for input(s): "AMMONIA" in the last 168 hours. Coagulation Profile: No results for input(s): "INR", "PROTIME" in the last 168 hours. Cardiac Enzymes: No results for input(s): "CKTOTAL", "CKMB", "CKMBINDEX", "TROPONINI" in the last 168 hours. BNP (last 3 results) No results for input(s): "PROBNP" in the last 8760 hours. HbA1C: No results for input(s): "HGBA1C" in the last 72 hours. CBG: Recent Labs  Lab 10/27/21 1316 10/27/21 1655 10/27/21 1901  GLUCAP 156* 104* 81   Lipid Profile: No results for input(s): "CHOL", "HDL", "LDLCALC", "TRIG", "CHOLHDL", "LDLDIRECT" in the last 72 hours. Thyroid Function Tests: No results for input(s): "TSH", "T4TOTAL", "FREET4", "T3FREE", "THYROIDAB" in the last 72 hours. Anemia Panel: No results for input(s): "VITAMINB12", "FOLATE", "FERRITIN", "TIBC", "IRON", "RETICCTPCT" in the last 72 hours. Sepsis Labs: Recent Labs  Lab 10/26/21 2028  LATICACIDVEN 1.4    Recent Results (from the past 240 hour(s))  Culture, blood (routine x 2)     Status: None (Preliminary result)   Collection Time: 10/26/21  8:28 PM   Specimen: BLOOD  Result Value Ref Range  Status   Specimen Description BLOOD BLOOD LEFT ARM  Final   Special Requests   Final    BOTTLES DRAWN AEROBIC AND ANAEROBIC Blood Culture adequate volume   Culture   Final    NO GROWTH < 12 HOURS Performed at [facility], [place] [address], [place], [place] [address]    Report Status PENDING  Incomplete  Culture, blood (routine x 2)     Status: None (Preliminary result)   Collection Time: 10/26/21  8:28 PM   Specimen: BLOOD  Result Value Ref Range Status   Specimen Description BLOOD BLOOD RIGHT ARM  Final   Special Requests   Final    BOTTLES DRAWN AEROBIC ONLY Blood Culture results may not be optimal due to an excessive volume of blood received in culture bottles   Culture   Final    NO GROWTH < 12 HOURS Performed at [facility], [place] [address] [place], [place] [address]    Report Status PENDING  Incomplete  Surgical pcr screen     Status: None   Collection Time: 10/27/21  5:01 PM   Specimen: Nasal  Mucosa; Nasal Swab  Result Value Ref Range Status   MRSA, PCR NEGATIVE NEGATIVE Final   Staphylococcus aureus NEGATIVE NEGATIVE Final    Comment: (NOTE) The Xpert SA Assay (FDA approved for NASAL specimens in patients 46 years of age and older), is one component of a comprehensive surveillance program. It is not intended to diagnose infection nor to guide or monitor treatment. Performed at [facility], [place] [address] [place], [place] [address]   Aerobic/Anaerobic Culture w Gram Stain (surgical/deep wound)     Status: None (Preliminary result)   Collection Time: 10/27/21  6:38 PM   Specimen: PATH Bone biopsy; Tissue  Result Value Ref Range Status   Specimen Description TISSUE  Final   Special Requests RIGHT 5TH METATARSAL ID3  Final   Gram Stain   Final    FEW WBC PRESENT,BOTH PMN AND MONONUCLEAR NO ORGANISMS SEEN Performed at [facility], [address] [place], [place] [address]    Culture PENDING  Incomplete   Report Status PENDING  Incomplete   Aerobic/Anaerobic Culture w Gram Stain (surgical/deep wound)     Status: None (Preliminary result)   Collection Time: 10/27/21  6:39 PM   Specimen: PATH Bone biopsy; Tissue  Result Value Ref Range Status   Specimen Description TISSUE  Final   Special Requests RIGHT CUBOID ID4  Final   Gram Stain   Final    NO WBC SEEN NO ORGANISMS SEEN Performed at [facility], [address], [place], [place] [address]    Culture PENDING  Incomplete   Report Status PENDING  Incomplete         Radiology Studies: MR FOOT RIGHT W WO CONTRAST  Result Date: 10/27/2021 CLINICAL DATA:  Diabetic with foot swelling. Concern for osteomyelitis. Patient reports swelling and wounds for more than 1 month for which the patient is taking antibiotics. EXAM: MRI OF THE RIGHT FOREFOOT WITHOUT AND WITH CONTRAST TECHNIQUE: Multiplanar, multisequence MR imaging of the right forefoot was performed before and after the administration of intravenous contrast. CONTRAST:  29m GADAVIST GADOBUTROL 1 MMOL/ML IV SOLN COMPARISON:  Radiographs 10/26/2021 and 09/02/2021.  MRI 09/29/2019. FINDINGS: Despite efforts by the technologist and patient, mild motion artifact is present on today's exam and could not be eliminated. This reduces exam sensitivity and specificity. Bones/Joint/Cartilage Since the previous MRI, the patient has undergone amputation through the mid shaft of the 5th metatarsal. There is a soft tissue wound lateral to the base of the 5th metatarsal associated with extensive inflammatory changes in the surrounding fat, incompletely visualized. The soft tissue findings are further described below. There are extensive marrow signal abnormalities throughout the 2nd through 5th metatarsal bases, the cuboid, middle and lateral cuneiform is which are progressive on the recent radiographs compared with the prior radiographs from 09/02/2021. There is osseous fragmentation with marrow edema and enhancement. The 4th and 5th  metatarsals appear subluxed posterolaterally at the Lisfranc joint. The 1st metatarsal appears unremarkable. The 1st through 4th digits appear unremarkable. Ligaments Intact Lisfranc ligament. Muscles and Tendons Diffuse T2 hyperintensity throughout the forefoot musculature, likely due to diabetes. No intramuscular fluid collection or definite tenosynovitis. Soft tissues As above, soft tissue ulceration lateral to the base of the 5th metatarsal with extensive edema and enhancement throughout the soft tissues of the lateral midfoot and forefoot. There is a complex peripherally enhancing fluid collection between the cuboid and bases of the 4th and 5th metatarsals, measuring up to 2.8 x 1.9 x 2.7 cm,  suspicious for an abscess. There are additional scattered peripherally enhancing fluid collections, including one posterior to the mid 4th metatarsal shaft which measures up to 1.5 cm on image 13/11. As noted, these inflammatory changes are incompletely visualized on this examination which does not include the hindfoot. IMPRESSION: 1. Extensive abnormalities of the 2nd through 5th metatarsal bases, the cuboid, middle and lateral cuneiforms as described. These findings have rapidly progressed from radiographs done 09/02/2021, suspicious for aggressive Charcot (neuropathic) joint. 2. In addition, there is an open lateral midfoot wound with extensive surrounding inflammatory changes and multiple peripherally enhancing fluid collections, suspicious for superimposed soft tissue infection. As the soft tissue findings of but the osseous changes, osteomyelitis may be present as well. 3. The proximal extent of these findings is not imaged on this examination of the forefoot. Consider further evaluation of the hindfoot/ankle if that would influence the patient's surgical treatment. Electronically Signed   By: Richardean Sale M.D.   On: 10/27/2021 09:27        Scheduled Meds:  chlorhexidine  15 mL Mouth/Throat Once   Or    mouth rinse  15 mL Mouth Rinse Once   enoxaparin (LOVENOX) injection  75 mg Subcutaneous Daily   insulin aspart  0-15 Units Subcutaneous TID WC   insulin aspart  0-5 Units Subcutaneous QHS   insulin glargine-yfgn  30 Units Subcutaneous Daily   sodium chloride flush  3 mL Intravenous Q12H   Continuous Infusions:  lactated ringers 125 mL/hr at 10/28/21 0538   lactated ringers     piperacillin-tazobactam (ZOSYN)  IV 3.375 g (10/28/21 0535)   vancomycin       LOS: 1 day   Time spent: 5mn  Sanuel Ladnier C Leslee Suire, DO [facility]  If 7PM-7AM, please contact night-coverage [email or web address]  10/28/2021, 7:27 AM

## 2021-10-28 NOTE — Progress Notes (Signed)
Subjective:  Patient ID: [name], male    DOB: 1986/02/16,  MRN: [id number]  Doing well is not having any pain  Negative for chest pain and shortness of breath Constitutional signs: no Review of all other systems is negative Objective:   Vitals:   10/28/21 0000 10/28/21 0400  BP: 128/78 120/88  Pulse: 72 72  Resp: 13   Temp: 97.6 F (36.4 C) 97.6 F (36.4 C)  SpO2: 99% 100%   General AA&O x3. Normal mood and affect.  Vascular Dorsalis pedis and posterior tibial pulses 2/4 bilat. Brisk capillary refill to all digits. Pedal hair present.  Neurologic Epicritic sensation grossly absent.  Dermatologic Dressing clean dry and intact  Orthopedic: MMT 5/5    Results for orders placed or performed during the hospital encounter of 10/26/21  Culture, blood (routine x 2)     Status: None (Preliminary result)   Collection Time: 10/26/21  8:28 PM   Specimen: BLOOD  Result Value Ref Range Status   Specimen Description BLOOD BLOOD LEFT ARM  Final   Special Requests   Final    BOTTLES DRAWN AEROBIC AND ANAEROBIC Blood Culture adequate volume   Culture   Final    NO GROWTH < 12 HOURS Performed at [facility], [address] [place], [place] [address]    Report Status PENDING  Incomplete  Culture, blood (routine x 2)     Status: None (Preliminary result)   Collection Time: 10/26/21  8:28 PM   Specimen: BLOOD  Result Value Ref Range Status   Specimen Description BLOOD BLOOD RIGHT ARM  Final   Special Requests   Final    BOTTLES DRAWN AEROBIC ONLY Blood Culture results may not be optimal due to an excessive volume of blood received in culture bottles   Culture   Final    NO GROWTH < 12 HOURS Performed at [facility], [address] [place], [place] [address]    Report Status PENDING  Incomplete  Surgical pcr screen     Status: None   Collection Time: 10/27/21  5:01 PM   Specimen: Nasal Mucosa; Nasal Swab  Result Value Ref Range Status   MRSA, PCR NEGATIVE  NEGATIVE Final   Staphylococcus aureus NEGATIVE NEGATIVE Final    Comment: (NOTE) The Xpert SA Assay (FDA approved for NASAL specimens in patients 73 years of age and older), is one component of a comprehensive surveillance program. It is not intended to diagnose infection nor to guide or monitor treatment. Performed at [facility], [address], [place], [place] [address]   Aerobic/Anaerobic Culture w Gram Stain (surgical/deep wound)     Status: None (Preliminary result)   Collection Time: 10/27/21  6:38 PM   Specimen: PATH Bone biopsy; Tissue  Result Value Ref Range Status   Specimen Description TISSUE  Final   Special Requests RIGHT 5TH METATARSAL ID3  Final   Gram Stain   Final    FEW WBC PRESENT,BOTH PMN AND MONONUCLEAR NO ORGANISMS SEEN Performed at [facility], [address] [place], [place] [address]    Culture PENDING  Incomplete   Report Status PENDING  Incomplete  Aerobic/Anaerobic Culture w Gram Stain (surgical/deep wound)     Status: None (Preliminary result)   Collection Time: 10/27/21  6:39 PM   Specimen: PATH Bone biopsy; Tissue  Result Value Ref Range Status   Specimen Description TISSUE  Final   Special Requests RIGHT CUBOID ID4  Final  Gram Stain   Final    NO WBC SEEN NO ORGANISMS SEEN Performed at [facility], [address] [place], [place] [address]    Culture PENDING  Incomplete   Report Status PENDING  Incomplete    Assessment & Plan:  Patient was evaluated and treated and all questions answered.  Bilateral foot ulcerations and diabetic right foot infection -So far no growth on culture data.  Path is pending.  Hopefully will have results within the next 1 to 2 days.  I discussed with the patient that the bone appeared to be healthy and viable clinically. -Expect regardless of antibiotic plan long-term that we will plan to return to the operating room for a skin substitute application to accelerate his wound  healing -Continue broad-spectrum IV antibiotics for now  Edwin Cap, DPM  Accessible via secure chat for questions or concerns.

## 2021-10-28 NOTE — Consult Note (Signed)
Reason for Consult: Bilateral foot ulcerations, concern for right foot osteomyelitis Referring Physician: Dr. Dr. Dixon  [name] is an 36 y.o. male.  HPI: He is known to my partner Dr. Loreta Ave as well as the wound care center for ongoing wound care of bilateral lower extremity diabetic foot ulcers.  He presented to our office on 10/26/2021 with worsening swelling and drainage from the right foot ulcer.  There was concern for infection and osteomyelitis.  He was admitted to the hospital.  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;  Surgeon: Toni Arthurs, MD;  Location: [facility];  Service: Orthopedics;  Laterality: Left;   AMPUTATION Right 10/22/2020   Procedure: Right Fifth ray amputation;  Surgeon: Toni Arthurs, MD;  Location: [facility];  Service: Orthopedics;  Laterality: Right;   WOUND DEBRIDEMENT Bilateral 10/02/2019   Procedure: DEBRIDEMENT WOUNDS BOTH LOWER EXTREMITIES WITH BONE BIOPSIES;  Surgeon: Vivi Barrack, DPM;  Location: [facility];  Service: Podiatry;  Laterality: Bilateral;    Family History  Problem Relation Age of Onset   Hypertension Other    Diabetes Other    Healthy Mother    Colon cancer Neg Hx    Stomach cancer Neg Hx    Pancreatic cancer Neg Hx    Esophageal cancer Neg Hx     Social History:  reports that he has never smoked. He has never used smokeless tobacco. He reports that he does not drink alcohol and does not use drugs.  Allergies:  Allergies  Allergen Reactions   Basaglar Kwikpen [Insulin Glargine] Diarrhea   Metformin And Related Diarrhea and Nausea And Vomiting   Trulicity [Dulaglutide] Diarrhea    Medications: I have reviewed the  patient's current medications.  Results for orders placed or performed during the hospital encounter of 10/26/21 (from the past 48 hour(s))  Comprehensive metabolic panel     Status: Abnormal   Collection Time: 10/26/21  8:27 PM  Result Value Ref Range   Sodium 138 135 - 145 mmol/L   Potassium 4.5 3.5 - 5.1 mmol/L   Chloride 106 98 - 111 mmol/L   CO2 26 22 - 32 mmol/L   Glucose, Bld 161 (H) 70 - 99 mg/dL    Comment: Glucose reference range applies only to samples taken after fasting for at least 8 hours.   BUN 14 6 - 20 mg/dL   Creatinine, Ser 7.40 0.61 - 1.24 mg/dL   Calcium 9.2 8.9 - 81.4 mg/dL   Total Protein 7.5 6.5 - 8.1 g/dL   Albumin 3.2 (L) 3.5 - 5.0 g/dL   AST 17 15 - 41 U/L   ALT 19 0 - 44 U/L   Alkaline Phosphatase 123 38 - 126 U/L   Total Bilirubin <0.1 (L) 0.3 - 1.2 mg/dL   GFR, Estimated >48 >18 mL/min    Comment: (NOTE) Calculated using the CKD-EPI Creatinine Equation (2021)    Anion gap 6 5 - 15    Comment: Performed at [facility], [address], [place], [place] [address]  CBC with Differential     Status: Abnormal   Collection Time: 10/26/21  8:27 PM  Result Value Ref Range   WBC 5.9 4.0 -  10.5 K/uL   RBC 4.29 4.22 - 5.81 MIL/uL   Hemoglobin 10.9 (L) 13.0 - 17.0 g/dL   HCT 16.136.1 (L) 09.639.0 - 04.552.0 %   MCV 84.1 80.0 - 100.0 fL   MCH 25.4 (L) 26.0 - 34.0 pg   MCHC 30.2 30.0 - 36.0 g/dL   RDW 40.915.2 81.111.5 - 91.415.5 %   Platelets 309 150 - 400 K/uL   nRBC 0.0 0.0 - 0.2 %   Neutrophils Relative % 57 %   Neutro Abs 3.4 1.7 - 7.7 K/uL   Lymphocytes Relative 27 %   Lymphs Abs 1.6 0.7 - 4.0 K/uL   Monocytes Relative 10 %   Monocytes Absolute 0.6 0.1 - 1.0 K/uL   Eosinophils Relative 5 %   Eosinophils Absolute 0.3 0.0 - 0.5 K/uL   Basophils Relative 1 %   Basophils Absolute 0.0 0.0 - 0.1 K/uL   Immature Granulocytes 0 %   Abs Immature Granulocytes 0.02 0.00 - 0.07 K/uL    Comment: Performed at [facility], [address] [place], [place] [address]   Sedimentation rate     Status: Abnormal   Collection Time: 10/26/21  8:27 PM  Result Value Ref Range   Sed Rate 45 (H) 0 - 16 mm/hr    Comment: Performed at [facility], [address] [place], [place] [address]  C-reactive protein     Status: Abnormal   Collection Time: 10/26/21  8:27 PM  Result Value Ref Range   CRP 2.0 (H) <1.0 mg/dL    Comment: Performed at [facility], [address] [place], [place] [address]  Lactic acid, plasma     Status: None   Collection Time: 10/26/21  8:28 PM  Result Value Ref Range   Lactic Acid, Venous 1.4 0.5 - 1.9 mmol/L    Comment: Performed at [facility], [address] [place], [place] [address]  Culture, blood (routine x 2)     Status: None (Preliminary result)   Collection Time: 10/26/21  8:28 PM   Specimen: BLOOD  Result Value Ref Range   Specimen Description BLOOD BLOOD LEFT ARM    Special Requests      BOTTLES DRAWN AEROBIC AND ANAEROBIC Blood Culture adequate volume   Culture      NO GROWTH < 12 HOURS Performed at [facility], [address] [place], [place] [address]    Report Status PENDING   Culture, blood (routine x 2)     Status: None (Preliminary result)   Collection Time: 10/26/21  8:28 PM   Specimen: BLOOD  Result Value Ref Range   Specimen Description BLOOD BLOOD RIGHT ARM    Special Requests      BOTTLES DRAWN AEROBIC ONLY Blood Culture results may not be optimal due to an excessive volume of blood received in culture bottles   Culture      NO GROWTH < 12 HOURS Performed at [facility] [place] [facility], [address] [place], [place] [address]    Report Status PENDING   Glucose, capillary     Status: Abnormal   Collection Time: 10/27/21  1:16 PM  Result Value Ref Range   Glucose-Capillary 156 (H) 70 - 99 mg/dL    Comment: Glucose reference range applies only to samples taken after fasting for at least 8 hours.  Glucose, capillary     Status: Abnormal   Collection Time: 10/27/21  4:55 PM   Result Value Ref Range  Glucose-Capillary 104 (H) 70 - 99 mg/dL    Comment: Glucose reference range applies only to samples taken after fasting for at least 8 hours.  Surgical pcr screen     Status: None   Collection Time: 10/27/21  5:01 PM   Specimen: Nasal Mucosa; Nasal Swab  Result Value Ref Range   MRSA, PCR NEGATIVE NEGATIVE   Staphylococcus aureus NEGATIVE NEGATIVE    Comment: (NOTE) The Xpert SA Assay (FDA approved for NASAL specimens in patients 75 years of age and older), is one component of a comprehensive surveillance program. It is not intended to diagnose infection nor to guide or monitor treatment. Performed at [facility], [address], [place], [place] [address]   Aerobic/Anaerobic Culture w Gram Stain (surgical/deep wound)     Status: None (Preliminary result)   Collection Time: 10/27/21  6:38 PM   Specimen: PATH Bone biopsy; Tissue  Result Value Ref Range   Specimen Description TISSUE    Special Requests RIGHT 5TH METATARSAL ID3    Gram Stain      FEW WBC PRESENT,BOTH PMN AND MONONUCLEAR NO ORGANISMS SEEN Performed at [facility], [address], [place], [place] [address]    Culture PENDING    Report Status PENDING   Aerobic/Anaerobic Culture w Gram Stain (surgical/deep wound)     Status: None (Preliminary result)   Collection Time: 10/27/21  6:39 PM   Specimen: PATH Bone biopsy; Tissue  Result Value Ref Range   Specimen Description TISSUE    Special Requests RIGHT CUBOID ID4    Gram Stain      NO WBC SEEN NO ORGANISMS SEEN Performed at [facility], [address] [place], [place] [address]    Culture PENDING    Report Status PENDING   Glucose, capillary     Status: None   Collection Time: 10/27/21  7:01 PM  Result Value Ref Range   Glucose-Capillary 81 70 - 99 mg/dL    Comment: Glucose reference range applies only to samples taken after fasting for at least 8 hours.  CBC     Status: Abnormal   Collection Time: 10/28/21   3:01 AM  Result Value Ref Range   WBC 5.3 4.0 - 10.5 K/uL   RBC 3.90 (L) 4.22 - 5.81 MIL/uL   Hemoglobin 9.9 (L) 13.0 - 17.0 g/dL   HCT 67.5 (L) 91.6 - 38.4 %   MCV 83.6 80.0 - 100.0 fL   MCH 25.4 (L) 26.0 - 34.0 pg   MCHC 30.4 30.0 - 36.0 g/dL   RDW 66.5 99.3 - 57.0 %   Platelets 273 150 - 400 K/uL   nRBC 0.0 0.0 - 0.2 %    Comment: Performed at [facility], [address], [place], [place] [address]  Basic metabolic panel     Status: Abnormal   Collection Time: 10/28/21  3:01 AM  Result Value Ref Range   Sodium 138 135 - 145 mmol/L   Potassium 4.4 3.5 - 5.1 mmol/L   Chloride 105 98 - 111 mmol/L   CO2 27 22 - 32 mmol/L   Glucose, Bld 172 (H) 70 - 99 mg/dL    Comment: Glucose reference range applies only to samples taken after fasting for at least 8 hours.   BUN 11 6 - 20 mg/dL   Creatinine, Ser 9.03 0.61 - 1.24 mg/dL   Calcium 8.8 (L) 8.9 - 10.3 mg/dL   GFR, Estimated >00 >92 mL/min  Comment: (NOTE) Calculated using the CKD-EPI Creatinine Equation (2021)    Anion gap 6 5 - 15    Comment: Performed at [facility], [address], [place], [place] [address]    MR FOOT RIGHT W WO CONTRAST  Result Date: 10/27/2021 CLINICAL DATA:  Diabetic with foot swelling. Concern for osteomyelitis. Patient reports swelling and wounds for more than 1 month for which the patient is taking antibiotics. EXAM: MRI OF THE RIGHT FOREFOOT WITHOUT AND WITH CONTRAST TECHNIQUE: Multiplanar, multisequence MR imaging of the right forefoot was performed before and after the administration of intravenous contrast. CONTRAST:  36mL GADAVIST GADOBUTROL 1 MMOL/ML IV SOLN COMPARISON:  Radiographs 10/26/2021 and 09/02/2021.  MRI 09/29/2019. FINDINGS: Despite efforts by the technologist and patient, mild motion artifact is present on today's exam and could not be eliminated. This reduces exam sensitivity and specificity. Bones/Joint/Cartilage Since the previous MRI, the patient has undergone amputation  through the mid shaft of the 5th metatarsal. There is a soft tissue wound lateral to the base of the 5th metatarsal associated with extensive inflammatory changes in the surrounding fat, incompletely visualized. The soft tissue findings are further described below. There are extensive marrow signal abnormalities throughout the 2nd through 5th metatarsal bases, the cuboid, middle and lateral cuneiform is which are progressive on the recent radiographs compared with the prior radiographs from 09/02/2021. There is osseous fragmentation with marrow edema and enhancement. The 4th and 5th metatarsals appear subluxed posterolaterally at the Lisfranc joint. The 1st metatarsal appears unremarkable. The 1st through 4th digits appear unremarkable. Ligaments Intact Lisfranc ligament. Muscles and Tendons Diffuse T2 hyperintensity throughout the forefoot musculature, likely due to diabetes. No intramuscular fluid collection or definite tenosynovitis. Soft tissues As above, soft tissue ulceration lateral to the base of the 5th metatarsal with extensive edema and enhancement throughout the soft tissues of the lateral midfoot and forefoot. There is a complex peripherally enhancing fluid collection between the cuboid and bases of the 4th and 5th metatarsals, measuring up to 2.8 x 1.9 x 2.7 cm, suspicious for an abscess. There are additional scattered peripherally enhancing fluid collections, including one posterior to the mid 4th metatarsal shaft which measures up to 1.5 cm on image 13/11. As noted, these inflammatory changes are incompletely visualized on this examination which does not include the hindfoot. IMPRESSION: 1. Extensive abnormalities of the 2nd through 5th metatarsal bases, the cuboid, middle and lateral cuneiforms as described. These findings have rapidly progressed from radiographs done 09/02/2021, suspicious for aggressive Charcot (neuropathic) joint. 2. In addition, there is an open lateral midfoot wound with  extensive surrounding inflammatory changes and multiple peripherally enhancing fluid collections, suspicious for superimposed soft tissue infection. As the soft tissue findings of but the osseous changes, osteomyelitis may be present as well. 3. The proximal extent of these findings is not imaged on this examination of the forefoot. Consider further evaluation of the hindfoot/ankle if that would influence the patient's surgical treatment. Electronically Signed   By: Carey Bullocks M.D.   On: 10/27/2021 09:27    ROS Blood pressure 120/88, pulse 72, temperature 97.6 F (36.4 C), temperature source Oral, resp. rate 13, height 6\' 5"  (1.956 m), weight (!) 149.7 kg, SpO2 100 %.  Vitals:   10/28/21 0000 10/28/21 0400  BP: 128/78 120/88  Pulse: 72 72  Resp: 13   Temp: 97.6 F (36.4 C) 97.6 F (36.4 C)  SpO2: 99% 100%    General AA&O x3. Normal mood and affect.  Vascular Dorsalis pedis  and posterior tibial pulses  present 1+ right  Capillary refill normal to all digits. Pedal hair growth diminished.  Neurologic Epicritic sensation grossly absent.  Dermatologic (Wound) Right foot ulceration measuring 3.0 x 4.0 x 1.5 cm fibrogranular wound bed, serous drainage, right dorsal midfoot 2 cm diameter circular, left midfoot 1 cm dorsal circular, left fifth metatarsal base 5.0 x 4.0 x 0.5 cm.  Serous drainage no purulence no significant cellulitis, he does have significant edema surrounding this  Orthopedic: Motor intact BLE.    Assessment/Plan:  Diabetic foot infection and ulceration possible osteomyelitis -Imaging: Studies independently reviewed -Antibiotics: Broad-spectrum IV antibiotics ordered for after surgery -WB Status: WBAT bilateral -Wound Care: Iodosorb change daily -Surgical Plan: To OR tonight for debridement of wound, bone biopsy of fifth metatarsal and cuboid  Edwin Cap 10/28/2021, 7:43 AM   Best available via secure chat for questions or concerns.

## 2021-10-28 NOTE — Op Note (Signed)
Patient Name: [name] DOB: June 23, 1985  MRN: [id number]   Date of Service: 10/26/2021 - 10/27/2021  Surgeon: Dr. Sharl Ma, DPM Assistants: None Pre-operative Diagnosis:  Right foot diabetic foot infection Right foot osteomyelitis Post-operative Diagnosis:  Right foot diabetic foot infection Right foot osteomyelitis Procedures:  1) bone biopsy trocar fifth metatarsal and cuboid  2) excisional debridement skin subcutaneous tissue and muscle total area 13 cm right foot Pathology/Specimens: ID Type Source Tests Collected by Time Destination  1 : Right 5th metatarsal Tissue PATH Bone biopsy SURGICAL PATHOLOGY Edwin Cap, DPM 10/27/2021 1831   2 : Right Cuboid Tissue PATH Bone biopsy SURGICAL PATHOLOGY Edwin Cap, DPM 10/27/2021 1834   3 : Right 5th metatarsal Tissue PATH Bone biopsy SURGICAL PATHOLOGY, AEROBIC/ANAEROBIC CULTURE W GRAM STAIN (SURGICAL/DEEP WOUND) Edwin Cap, DPM 10/27/2021 1838   4 : Right Cuboid Tissue PATH Bone biopsy SURGICAL PATHOLOGY, AEROBIC/ANAEROBIC CULTURE W GRAM STAIN (SURGICAL/DEEP WOUND) Edwin Cap, [facility] 10/27/2021 1839    Anesthesia: General anesthesia Hemostasis: * No tourniquets in log * Estimated Blood Loss: 10 mL Materials: * No implants in log * Medications: 12 cc 0.5% Marcaine plain Complications: No complication noted  Indications for Procedure:  This is a 36 y.o. male with a history of diabetes and neuropathic ulcerations on both lower extremities, there was concern for osteomyelitis on his right foot.  Debridement and biopsy was recommended   Procedure in Detail: Patient was identified in pre-operative holding area. Formal consent was signed and the right lower extremity was marked. Patient was brought back to the operating room. Anesthesia was induced. The extremity was prepped and draped in the usual sterile fashion. Timeout was taken to confirm patient name, laterality, and procedure prior to incision.   Attention was  then directed to the right foot which exhibited open ulcerations on the dorsal midfoot and right lateral midfoot.  The lateral ulceration measured 3.0 x 4.0 x 1.5 cm with a sinus tract centrally.  The dorsal ulceration measures 1.0 x 1.0 x 0.3 cm.  No sinus tract.  Fibrogranular wound bed present on both.  There is no cellulitis or purulence.  I began with excisional sharp debridement utilizing scalpel and curette of both ulcerations until all nonviable tissue was removed.  The wound was thoroughly irrigated with 3 L normal sterile saline with a pulse irrigator.  Once this was complete the wounds were covered, new sterile gloves were donned, and a small incision was made over the fifth metatarsal and cuboid respectively.  A Jamshidi needle was used to obtain a core biopsy of both bones, this was sectioned and 1 piece each was sent for culture and 1 for pathology for each bone.  These wounds were irrigated and closed with 3-0 nylon.  The foot was then dressed with vancomycin powder Adaptic and dry sterile dressings. Patient tolerated the procedure well.   Disposition: Following a period of post-operative monitoring, patient will be transferred to the floor for further management.  I expect he will return for a skin substitute application later this week as part of a staged procedure for his ongoing wound healing.

## 2021-10-28 NOTE — Plan of Care (Signed)
  Problem: Education: Goal: Knowledge of General Education information will improve Description: Including pain rating scale, medication(s)/side effects and non-pharmacologic comfort measures Outcome: Progressing   Problem: Health Behavior/Discharge Planning: Goal: Ability to manage health-related needs will improve Outcome: Progressing   Problem: Clinical Measurements: Goal: Will remain free from infection Outcome: Progressing   Problem: Activity: Goal: Risk for activity intolerance will decrease Outcome: Progressing   Problem: Clinical Measurements: Goal: Respiratory complications will improve Outcome: Adequate for Discharge   Problem: Nutrition: Goal: Adequate nutrition will be maintained Outcome: Adequate for Discharge

## 2021-10-29 DIAGNOSIS — L089 Local infection of the skin and subcutaneous tissue, unspecified: Secondary | ICD-10-CM | POA: Diagnosis not present

## 2021-10-29 DIAGNOSIS — E11628 Type 2 diabetes mellitus with other skin complications: Secondary | ICD-10-CM | POA: Diagnosis not present

## 2021-10-29 LAB — BASIC METABOLIC PANEL
Anion gap: 5 (ref 5–15)
BUN: 8 mg/dL (ref 6–20)
CO2: 26 mmol/L (ref 22–32)
Calcium: 8.9 mg/dL (ref 8.9–10.3)
Chloride: 106 mmol/L (ref 98–111)
Creatinine, Ser: 1.04 mg/dL (ref 0.61–1.24)
GFR, Estimated: >60 mL/min (ref >60)
Glucose, Bld: 189 mg/dL — ABNORMAL HIGH (ref 70–99)
Potassium: 4.3 mmol/L (ref 3.5–5.1)
Sodium: 137 mmol/L (ref 135–145)

## 2021-10-29 LAB — SURGICAL PATHOLOGY

## 2021-10-29 LAB — CBC
HCT: 33.7 % — ABNORMAL LOW (ref 39.0–52.0)
Hemoglobin: 10.2 g/dL — ABNORMAL LOW (ref 13.0–17.0)
MCH: 25 pg — ABNORMAL LOW (ref 26.0–34.0)
MCHC: 30.3 g/dL (ref 30.0–36.0)
MCV: 82.6 fL (ref 80.0–100.0)
Platelets: 265 10*3/uL (ref 150–400)
RBC: 4.08 MIL/uL — ABNORMAL LOW (ref 4.22–5.81)
RDW: 15 % (ref 11.5–15.5)
WBC: 4.7 10*3/uL (ref 4.0–10.5)
nRBC: 0 % (ref 0.0–0.2)

## 2021-10-29 LAB — GLUCOSE, CAPILLARY
Glucose-Capillary: 176 mg/dL — ABNORMAL HIGH (ref 70–99)
Glucose-Capillary: 155 mg/dL — ABNORMAL HIGH (ref 70–99)
Glucose-Capillary: 179 mg/dL — ABNORMAL HIGH (ref 70–99)
Glucose-Capillary: 225 mg/dL — ABNORMAL HIGH (ref 70–99)

## 2021-10-29 NOTE — Progress Notes (Signed)
Pharmacy Antibiotic Note  [name] is a 36 y.o. male admitted on 10/26/2021 with diabetic foot infection. Continues on broad spectrum antibiotics pending cultures.  Scr WNL  Plan: Continue Vancomycin 1500 mg IV q12h (eAUC 431.9, Scr 1.22) Continue Zosyn 3.375 grams iv Q 8 hours   Height: 6\' 5"  (195.6 cm) Weight: (!) 149.7 kg (330 lb) IBW/kg (Calculated) : 89.1  Temp (24hrs), Avg:97.8 F (36.6 C), Min:97.7 F (36.5 C), Max:97.8 F (36.6 C)  Recent Labs  Lab 10/26/21 2027 10/26/21 2028 10/28/21 0301 10/29/21 0516  WBC 5.9  --  5.3 4.7  CREATININE 1.22  --  1.05 1.04  LATICACIDVEN  --  1.4  --   --      Estimated Creatinine Clearance: 158.9 mL/min (by C-G formula based on SCr of 1.04 mg/dL).    Allergies  Allergen Reactions   Basaglar Kwikpen [Insulin Glargine] Diarrhea   Metformin And Related Diarrhea and Nausea And Vomiting   Trulicity [Dulaglutide] Diarrhea    Antimicrobials this admission: Vancomycin 7/26  >>  Zosyn 7/26 >>   Dose adjustments this admission:   Microbiology results: 7/25 BCx: NGTD 7/26 bone biopsy Cx: sent   Thank you 8/26, PharmD  10/29/2021  8:06 AM

## 2021-10-29 NOTE — Anesthesia Postprocedure Evaluation (Signed)
Anesthesia Post Note  Patient: [name]  Procedure(s) Performed: RIGHT FOOT DEBRIDEMENT WOUND AND BONE BIOPSY (Right)     Patient location during evaluation: PACU Anesthesia Type: General Level of consciousness: awake and alert Pain management: pain level controlled Vital Signs Assessment: post-procedure vital signs reviewed and stable Respiratory status: spontaneous breathing, nonlabored ventilation and respiratory function stable Cardiovascular status: blood pressure returned to baseline and stable Postop Assessment: no apparent nausea or vomiting Anesthetic complications: no   No notable events documented.  Last Vitals:  Vitals:   10/28/21 2017 10/29/21 0720  BP: 134/78 (!) 139/93  Pulse: 79 77  Resp: 15   Temp: 36.6 C 36.5 C  SpO2: 94% 93%    Last Pain:  Vitals:   10/29/21 0720  TempSrc: Oral  PainSc:    Pain Goal:                   Tressie Ragin

## 2021-10-29 NOTE — Progress Notes (Signed)
PROGRESS NOTE    [name]  PXT:[id number] DOB: 03/05/1986 DOA: 10/26/2021 PCP: [name], MD   Brief Narrative:  [name] is a 36 y.o. 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  Recently been hospitalized in 6/11 -6/13 for possible gastroenteritis. Post hospitalization patient noted worsening right foot swelling with difficulty obtaining MRI due to insurance denial ultimately placed on antibiotics with worsening pain/swelling who now presents with MRI imaging showing soft tissue infection and possible osteomyelitis.  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)   Diabetic foot infection, POA (does not meet sepsis criteria) Charcot foot due to diabetes -Failure of outpatient antibiotics (unspecified antibiotics) -MRI concerning for aggressive Charcot joint but underlying osteomyelitis could not be ruled out, biopsy and cultures pending from interoperative evaluation with podiatry -Podiatry following, appreciate insight recommendations, plan for repeat surgical evaluation/possible skin grafting in the near future per podiatry schedule -Vanc/zosyn ongoing -de-escalate pending cultures   Uncontrolled diabetes mellitus type I, controlled A1c 8 on 09/13/2021. Home regimen: Lantus 40-80 units daily with meals. -Continue Semglee  30 units daily, sliding scale insulin, hypoglycemic protocol   Diarrhea, transient, resolved -Patient reported having diarrhea 2 days prior to admission -C. difficile screening canceled, patient has not had a bowel movement in over 12 hours -this is not consistent with C. difficile infection   Chronic anemia of chronic disease -Continue to monitor   Obesity BMI 39.13 kg/m   DVT prophylaxis: Lovenox Code Status: Full Family Communication: None present  Status is: Inpatient  Dispo: The patient is from: Home              Anticipated d/c is to: Home              Anticipated d/c date is: 48 to 72 hours pending further surgical evaluation              Patient currently not medically stable for discharge  Consultants:  Podiatry  Procedures:  Right foot wound debridement and bone biopsy  Antimicrobials:  Vancomycin, Zosyn  Subjective: No acute issues or events overnight, pain currently well controlled denies nausea vomiting diarrhea constipation headache fevers chills or chest pain  Objective: Vitals:   10/28/21 0400 10/28/21 0915 10/28/21 2017 10/29/21 0720  BP: 120/88 (!) 141/87 134/78 (!) 139/93  Pulse: 72 78 79 77  Resp:  16 15   Temp: 97.6 F (36.4 C) 97.8 F (36.6 C) 97.8 F (36.6 C) 97.7 F (36.5 C)  TempSrc: Oral Oral Oral Oral  SpO2: 100% 99% 94% 93%  Weight:      Height:        Intake/Output Summary (Last 24 hours) at 10/29/2021 0725 Last data filed at 10/29/2021 0643 Gross per 24 hour  Intake 2529.77 ml  Output 2150 ml  Net 379.77 ml    Filed Weights   10/26/21 1951  Weight: (!) 149.7 kg    Examination:  General:  Pleasantly resting in bed, No acute distress. HEENT:  Normocephalic atraumatic.  Sclerae nonicteric, noninjected.  Extraocular movements intact bilaterally. Neck:  Without mass or deformity.  Trachea is midline. Lungs:  Clear to auscultate bilaterally without rhonchi, wheeze, or rales. Heart:  Regular rate and rhythm.  Without  murmurs, rubs, or gallops. Abdomen: Obese, soft, nontender, nondistended.  Without guarding or rebound. Extremities: Bilateral foot and ankle dressings, notable ray amputations of the fifth tarsal and metatarsal bilaterally Vascular:  Dorsalis pedis and posterior tibial pulses weakly palpable bilaterally. Skin:  Warm and dry, no erythema, no ulcerations.   Data Reviewed: I have personally reviewed following labs and imaging studies  CBC: Recent Labs  Lab 10/26/21 2027 10/28/21 0301 10/29/21 0516  WBC 5.9 5.3 4.7  NEUTROABS 3.4  --   --   HGB 10.9* 9.9* 10.2*  HCT 36.1* 32.6* 33.7*  MCV 84.1 83.6 82.6  PLT 309 273 265    Basic Metabolic Panel: Recent Labs  Lab 10/26/21 2027 10/28/21 0301 10/29/21 0516  NA 138 138 137  K 4.5 4.4 4.3  CL 106 105 106  CO2 26 27 26   GLUCOSE 161* 172* 189*  BUN 14 11 8   CREATININE 1.22 1.05 1.04  CALCIUM 9.2 8.8* 8.9    GFR: Estimated Creatinine Clearance: 158.9 mL/min (by C-G formula based on SCr of 1.04 mg/dL). Liver Function Tests: Recent Labs  Lab 10/26/21 2027  AST 17  ALT 19  ALKPHOS 123  BILITOT <0.1*  PROT 7.5  ALBUMIN 3.2*    No results for input(s): "LIPASE", "AMYLASE" in the last 168 hours. No results for input(s): "AMMONIA" in the last 168 hours. Coagulation Profile: No results for input(s): "INR", "PROTIME" in the last 168 hours. Cardiac Enzymes: No results for input(s): "CKTOTAL", "CKMB", "CKMBINDEX", "TROPONINI" in the last 168 hours. BNP (last 3 results) No results for input(s): "PROBNP" in the last 8760 hours. HbA1C: No results for input(s): "HGBA1C" in the last 72 hours. CBG: Recent Labs  Lab 10/28/21 0753 10/28/21 1122 10/28/21 1627 10/28/21 2020 10/29/21 0721  GLUCAP 208* 180* 125* 162* 176*    Lipid Profile: No results for input(s): "CHOL", "HDL", "LDLCALC", "TRIG", "CHOLHDL", "LDLDIRECT" in the last 72 hours. Thyroid Function Tests: No results for input(s): "TSH", "T4TOTAL", "FREET4",  "T3FREE", "THYROIDAB" in the last 72 hours. Anemia Panel: No results for input(s): "VITAMINB12", "FOLATE", "FERRITIN", "TIBC", "IRON", "RETICCTPCT" in the last 72 hours. Sepsis Labs: Recent Labs  Lab 10/26/21 2028  LATICACIDVEN 1.4     Recent Results (from the past 240 hour(s))  Culture, blood (routine x 2)     Status: None (Preliminary result)   Collection Time: 10/26/21  8:28 PM   Specimen: BLOOD  Result Value Ref Range Status   Specimen Description BLOOD BLOOD LEFT ARM  Final   Special Requests   Final    BOTTLES DRAWN AEROBIC AND ANAEROBIC Blood Culture adequate volume   Culture   Final    NO GROWTH 3 DAYS Performed at [facility] Lab, [address], [place], [address] [place]    Report Status PENDING  Incomplete  Culture, blood (routine x 2)     Status: None (Preliminary result)   Collection Time: 10/26/21  8:28 PM   Specimen: BLOOD  Result Value Ref Range Status   Specimen Description BLOOD BLOOD RIGHT ARM  Final   Special Requests   Final    BOTTLES DRAWN AEROBIC ONLY Blood Culture results may not be optimal due to an excessive  volume of blood received in culture bottles   Culture   Final    NO GROWTH 3 DAYS Performed at [facility], [address], [place], [place] [address]    Report Status PENDING  Incomplete  Surgical pcr screen     Status: None   Collection Time: 10/27/21  5:01 PM   Specimen: Nasal Mucosa; Nasal Swab  Result Value Ref Range Status   MRSA, PCR NEGATIVE NEGATIVE Final   Staphylococcus aureus NEGATIVE NEGATIVE Final    Comment: (NOTE) The Xpert SA Assay (FDA approved for NASAL specimens in patients 41 years of age and older), is one component of a comprehensive surveillance program. It is not intended to diagnose infection nor to guide or monitor treatment. Performed at [facility] Lab, [address], [place], [place] [address]   Aerobic/Anaerobic Culture w Gram Stain (surgical/deep wound)     Status: None (Preliminary result)    Collection Time: 10/27/21  6:38 PM   Specimen: PATH Bone biopsy; Tissue  Result Value Ref Range Status   Specimen Description TISSUE  Final   Special Requests RIGHT 5TH METATARSAL ID3  Final   Gram Stain   Final    FEW WBC PRESENT,BOTH PMN AND MONONUCLEAR NO ORGANISMS SEEN    Culture   Final    NO GROWTH < 12 HOURS Performed at [facility] Lab, [address] [place], [place] [address]    Report Status PENDING  Incomplete  Aerobic/Anaerobic Culture w Gram Stain (surgical/deep wound)     Status: None (Preliminary result)   Collection Time: 10/27/21  6:39 PM   Specimen: PATH Bone biopsy; Tissue  Result Value Ref Range Status   Specimen Description TISSUE  Final   Special Requests RIGHT CUBOID ID4  Final   Gram Stain NO WBC SEEN NO ORGANISMS SEEN   Final   Culture   Final    NO GROWTH < 12 HOURS Performed at [facility], [address] [place], [place] [address]    Report Status PENDING  Incomplete         Radiology Studies: MR FOOT RIGHT W WO CONTRAST  Result Date: 10/27/2021 CLINICAL DATA:  Diabetic with foot swelling. Concern for osteomyelitis. Patient reports swelling and wounds for more than 1 month for which the patient is taking antibiotics. EXAM: MRI OF THE RIGHT FOREFOOT WITHOUT AND WITH CONTRAST TECHNIQUE: Multiplanar, multisequence MR imaging of the right forefoot was performed before and after the administration of intravenous contrast. CONTRAST:  61mL GADAVIST GADOBUTROL 1 MMOL/ML IV SOLN COMPARISON:  Radiographs 10/26/2021 and 09/02/2021.  MRI 09/29/2019. FINDINGS: Despite efforts by the technologist and patient, mild motion artifact is present on today's exam and could not be eliminated. This reduces exam sensitivity and specificity. Bones/Joint/Cartilage Since the previous MRI, the patient has undergone amputation through the mid shaft of the 5th metatarsal. There is a soft tissue wound lateral to the base of the 5th metatarsal associated with  extensive inflammatory changes in the surrounding fat, incompletely visualized. The soft tissue findings are further described below. There are extensive marrow signal abnormalities throughout the 2nd through 5th metatarsal bases, the cuboid, middle and lateral cuneiform is which are progressive on the recent radiographs compared with the prior radiographs from 09/02/2021. There is osseous fragmentation with marrow edema and enhancement. The 4th and 5th metatarsals appear subluxed posterolaterally at the Lisfranc joint. The 1st metatarsal appears unremarkable. The 1st through 4th digits appear unremarkable. Ligaments Intact Lisfranc ligament. Muscles and  Tendons Diffuse T2 hyperintensity throughout the forefoot musculature, likely due to diabetes. No intramuscular fluid collection or definite tenosynovitis. Soft tissues As above, soft tissue ulceration lateral to the base of the 5th metatarsal with extensive edema and enhancement throughout the soft tissues of the lateral midfoot and forefoot. There is a complex peripherally enhancing fluid collection between the cuboid and bases of the 4th and 5th metatarsals, measuring up to 2.8 x 1.9 x 2.7 cm, suspicious for an abscess. There are additional scattered peripherally enhancing fluid collections, including one posterior to the mid 4th metatarsal shaft which measures up to 1.5 cm on image 13/11. As noted, these inflammatory changes are incompletely visualized on this examination which does not include the hindfoot. IMPRESSION: 1. Extensive abnormalities of the 2nd through 5th metatarsal bases, the cuboid, middle and lateral cuneiforms as described. These findings have rapidly progressed from radiographs done 09/02/2021, suspicious for aggressive Charcot (neuropathic) joint. 2. In addition, there is an open lateral midfoot wound with extensive surrounding inflammatory changes and multiple peripherally enhancing fluid collections, suspicious for superimposed soft tissue  infection. As the soft tissue findings of but the osseous changes, osteomyelitis may be present as well. 3. The proximal extent of these findings is not imaged on this examination of the forefoot. Consider further evaluation of the hindfoot/ankle if that would influence the patient's surgical treatment. Electronically Signed   By: Carey Bullocks M.D.   On: 10/27/2021 09:27        Scheduled Meds:  chlorhexidine  15 mL Mouth/Throat Once   Or   mouth rinse  15 mL Mouth Rinse Once   enoxaparin (LOVENOX) injection  75 mg Subcutaneous Daily   insulin aspart  0-15 Units Subcutaneous TID WC   insulin aspart  0-5 Units Subcutaneous QHS   insulin glargine-yfgn  30 Units Subcutaneous Daily   sodium chloride flush  3 mL Intravenous Q12H   Continuous Infusions:  lactated ringers 125 mL/hr at 10/28/21 2110   lactated ringers     piperacillin-tazobactam (ZOSYN)  IV 3.375 g (10/29/21 0524)   vancomycin 1,500 mg (10/28/21 2103)     LOS: 2 days   Time spent:  Azucena Fallen, DO [facility]  If 7PM-7AM, please contact night-coverage [email or web address]  10/29/2021, 7:25 AM

## 2021-10-29 NOTE — TOC Initial Note (Signed)
Transition of Care [facility]) - Initial/Assessment Note    Patient Details  Name: [name] MRN: [id number] Date of Birth: [birth year]  Transition of Care [facility] CM/SW Contact:    Curlene Labrum, RN Phone Number: 10/29/2021, 7:52 AM  Clinical Narrative:                 CM met with the patient at the bedside to discuss transitions of care needs.  The patient plans to return home with his fiance - pending bone biopsy results in the next 1-2 days.  The patient remains WBAT.  The patient has history of diabetes and has available, working blood sugar meter at home.  The patient is currently receiving IV antibiotics and at this time has no needs through [facility] Team.  CM will continue to follow the patient for discharge needs for home.  Expected Discharge Plan: Home/Self Care Barriers to Discharge: Continued Medical Work up   Patient Goals and CMS Choice Patient states their goals for this hospitalization and ongoing recovery are:: To get better and go home CMS Medicare.gov Compare Post Acute Care list provided to:: Patient Choice offered to / list presented to : Patient  Expected Discharge Plan and Services Expected Discharge Plan: Home/Self Care       Living arrangements for the past 2 months: Single Family Home                                      Prior Living Arrangements/Services Living arrangements for the past 2 months: Single Family Home Lives with:: Significant Other Patient language and need for interpreter reviewed:: Yes Do you feel safe going back to the place where you live?: Yes      Need for Family Participation in Patient Care: Yes (Comment) Care giver support system in place?: Yes (comment)   Criminal Activity/Legal Involvement Pertinent to Current Situation/Hospitalization: No - Comment as needed  Activities of Daily Living      Permission Sought/Granted Permission sought to share information with : Case Manager, PCP, Family Supports Permission  granted to share information with : Yes, Verbal Permission Granted        Permission granted to share info w Relationship: Raquel James - [phone]     Emotional Assessment Appearance:: Appears stated age Attitude/Demeanor/Rapport: Gracious Affect (typically observed): Accepting Orientation: : Oriented to Self, Oriented to Place, Oriented to  Time, Oriented to Situation Alcohol / Substance Use: Not Applicable Psych Involvement: No (comment)  Admission diagnosis:  Diabetic foot infection ([place]) [P53.748, L08.9] Patient Active Problem List   Diagnosis Date Noted   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)    Obesity (BMI 30-39.9) 10/27/2021   Charcot foot due to diabetes mellitus ([place]) 10/27/2021   Hepatic steatosis 09/14/2021   Intractable nausea and vomiting 09/13/2021   Obesity, Class III, BMI 40-49.9 (morbid obesity) ([place]) 09/13/2021   Splenic lesion 09/13/2021   Diabetic ulcer of right midfoot associated with diabetes mellitus due to underlying condition, with muscle involvement without evidence of necrosis (HCC)    Leg swelling    Cellulitis of right lower extremity    Diabetic foot infection ([place]) 09/28/2019   Hypochromic anemia 09/28/2019   Severe nonproliferative diabetic retinopathy of right eye, with macular edema, associated with type 1 diabetes mellitus ([place]) 07/18/2019   Severe nonproliferative diabetic retinopathy of left eye, with macular edema, associated with type 1 diabetes mellitus ([place]) 07/18/2019  Diabetic cataract ([place]) 07/18/2019   Osteomyelitis of ankle or foot, acute, left ([place]) 03/11/2019   Pneumonia due to COVID-19 virus 02/07/2019   Uncontrolled DM-1 with hyperglycemia, retinopathy, cataract and b/l feet ulcer 02/07/2019   Type 1 diabetes mellitus with diabetic cataract ([place]) 02/03/2019   AKI (acute kidney injury) ([place]) 02/03/2019   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

## 2021-10-29 NOTE — Progress Notes (Signed)
Subjective:  Patient ID: [name], male    DOB: [birth year],  MRN: [id number]  Doing well. His wife is at bedside.  Negative for chest pain and shortness of breath Constitutional signs: no Review of all other systems is negative Objective:   Vitals:   10/29/21 0720 10/29/21 1926  BP: (!) 139/93 135/80  Pulse: 77 94  Resp:    Temp: 97.7 F (36.5 C) 98.4 F (36.9 C)  SpO2: 93% 97%   General AA&O x3. Normal mood and affect.  Vascular Dorsalis pedis and posterior tibial pulses 2/4 bilat. Brisk capillary refill to all digits. Pedal hair present.  Neurologic Epicritic sensation grossly absent.  Dermatologic Dressing clean dry and intact  Orthopedic: MMT 5/5    Results for orders placed or performed during the hospital encounter of 10/26/21  Culture, blood (routine x 2)     Status: None (Preliminary result)   Collection Time: 10/26/21  8:28 PM   Specimen: BLOOD  Result Value Ref Range Status   Specimen Description BLOOD BLOOD LEFT ARM  Final   Special Requests   Final    BOTTLES DRAWN AEROBIC AND ANAEROBIC Blood Culture adequate volume   Culture   Final    NO GROWTH 3 DAYS Performed at [facility], [address] [place], [place] [address]    Report Status PENDING  Incomplete  Culture, blood (routine x 2)     Status: None (Preliminary result)   Collection Time: 10/26/21  8:28 PM   Specimen: BLOOD  Result Value Ref Range Status   Specimen Description BLOOD BLOOD RIGHT ARM  Final   Special Requests   Final    BOTTLES DRAWN AEROBIC ONLY Blood Culture results may not be optimal due to an excessive volume of blood received in culture bottles   Culture   Final    NO GROWTH 3 DAYS Performed at [facility], [address] [place], [place] [address]    Report Status PENDING  Incomplete  Surgical pcr screen     Status: None   Collection Time: 10/27/21  5:01 PM   Specimen: Nasal Mucosa; Nasal Swab  Result Value Ref Range Status   MRSA, PCR NEGATIVE NEGATIVE  Final   Staphylococcus aureus NEGATIVE NEGATIVE Final    Comment: (NOTE) The Xpert SA Assay (FDA approved for NASAL specimens in patients 65 years of age and older), is one component of a comprehensive surveillance program. It is not intended to diagnose infection nor to guide or monitor treatment. Performed at [facility], [address] [place], [place] [address]   Aerobic/Anaerobic Culture w Gram Stain (surgical/deep wound)     Status: None (Preliminary result)   Collection Time: 10/27/21  6:38 PM   Specimen: PATH Bone biopsy; Tissue  Result Value Ref Range Status   Specimen Description TISSUE  Final   Special Requests RIGHT 5TH METATARSAL ID3  Final   Gram Stain   Final    FEW WBC PRESENT,BOTH PMN AND MONONUCLEAR NO ORGANISMS SEEN    Culture   Final    NO GROWTH 2 DAYS NO ANAEROBES ISOLATED; CULTURE IN PROGRESS FOR 5 DAYS Performed at [facility], [address] [place], [place] [address]    Report Status PENDING  Incomplete  Aerobic/Anaerobic Culture w Gram Stain (surgical/deep wound)     Status: None (Preliminary result)   Collection Time: 10/27/21  6:39 PM   Specimen: PATH Bone biopsy; Tissue  Result Value Ref Range Status  Specimen Description TISSUE  Final   Special Requests RIGHT CUBOID ID4  Final   Gram Stain NO WBC SEEN NO ORGANISMS SEEN   Final   Culture   Final    NO GROWTH 2 DAYS NO ANAEROBES ISOLATED; CULTURE IN PROGRESS FOR 5 DAYS Performed at [facility] Lab, [address] [place], [place] [address]    Report Status PENDING  Incomplete    Assessment & Plan:  Patient was evaluated and treated and all questions answered.  Bilateral foot ulcerations and diabetic right foot infection -Reviewed path and culture results. No amputation or further bone debridement recommended. I did recommend returning to OR for skin sub application to accelerate wound healing -Can transition to PO abx -OR tomorrow. NPO past midnight  Edwin Cap,  DPM  Accessible via secure chat for questions or concerns.

## 2021-10-30 ENCOUNTER — Encounter (HOSPITAL_COMMUNITY)
Admission: EM | Disposition: A | Discharge status: Home / Self Care | Payer: Self-pay | Source: Home / Self Care | Attending: Internal Medicine

## 2021-10-30 ENCOUNTER — Inpatient Hospital Stay (HOSPITAL_COMMUNITY): Payer: BC Managed Care – PPO | Admitting: Certified Registered"

## 2021-10-30 ENCOUNTER — Other Ambulatory Visit: Payer: Self-pay

## 2021-10-30 ENCOUNTER — Encounter (HOSPITAL_COMMUNITY): Payer: Self-pay | Admitting: Internal Medicine

## 2021-10-30 DIAGNOSIS — E08621 Diabetes mellitus due to underlying condition with foot ulcer: Secondary | ICD-10-CM

## 2021-10-30 DIAGNOSIS — E10621 Type 1 diabetes mellitus with foot ulcer: Secondary | ICD-10-CM

## 2021-10-30 DIAGNOSIS — L97425 Non-pressure chronic ulcer of left heel and midfoot with muscle involvement without evidence of necrosis: Secondary | ICD-10-CM

## 2021-10-30 DIAGNOSIS — L97415 Non-pressure chronic ulcer of right heel and midfoot with muscle involvement without evidence of necrosis: Secondary | ICD-10-CM

## 2021-10-30 DIAGNOSIS — L089 Local infection of the skin and subcutaneous tissue, unspecified: Secondary | ICD-10-CM | POA: Diagnosis not present

## 2021-10-30 DIAGNOSIS — E11628 Type 2 diabetes mellitus with other skin complications: Secondary | ICD-10-CM | POA: Diagnosis not present

## 2021-10-30 HISTORY — PX: I & D EXTREMITY: SHX5045

## 2021-10-30 LAB — GLUCOSE, CAPILLARY
Glucose-Capillary: 238 mg/dL — ABNORMAL HIGH (ref 70–99)
Glucose-Capillary: 184 mg/dL — ABNORMAL HIGH (ref 70–99)
Glucose-Capillary: 164 mg/dL — ABNORMAL HIGH (ref 70–99)
Glucose-Capillary: 137 mg/dL — ABNORMAL HIGH (ref 70–99)
Glucose-Capillary: 286 mg/dL — ABNORMAL HIGH (ref 70–99)

## 2021-10-30 SURGERY — IRRIGATION AND DEBRIDEMENT EXTREMITY
Anesthesia: General | Site: Foot | Laterality: Bilateral

## 2021-10-30 MED ORDER — CHLORHEXIDINE GLUCONATE 0.12 % MT SOLN
OROMUCOSAL | Status: AC
  Administered 2021-10-30: 15 mL via OROMUCOSAL
  Filled 2021-10-30: qty 15

## 2021-10-30 MED ORDER — EPHEDRINE SULFATE-NACL 50-0.9 MG/10ML-% IV SOSY
PREFILLED_SYRINGE | INTRAVENOUS | Status: DC | PRN
  Administered 2021-10-30 (×2): 10 mg via INTRAVENOUS

## 2021-10-30 MED ORDER — LIDOCAINE 2% (20 MG/ML) 5 ML SYRINGE
INTRAMUSCULAR | Status: DC | PRN
  Administered 2021-10-30: 60 mg via INTRAVENOUS

## 2021-10-30 MED ORDER — DEXAMETHASONE SODIUM PHOSPHATE 10 MG/ML IJ SOLN
INTRAMUSCULAR | Status: DC | PRN
  Administered 2021-10-30: 4 mg via INTRAVENOUS

## 2021-10-30 MED ORDER — ACETAMINOPHEN 10 MG/ML IV SOLN: 1000.0000 mg | Freq: Once | INTRAVENOUS | Status: DC | PRN

## 2021-10-30 MED ORDER — 0.9 % SODIUM CHLORIDE (POUR BTL) OPTIME
TOPICAL | Status: DC | PRN
  Administered 2021-10-30: 1000 mL

## 2021-10-30 MED ORDER — PHENYLEPHRINE 80 MCG/ML (10ML) SYRINGE FOR IV PUSH (FOR BLOOD PRESSURE SUPPORT)
PREFILLED_SYRINGE | INTRAVENOUS | Status: AC
  Filled 2021-10-30: qty 10

## 2021-10-30 MED ORDER — PROMETHAZINE HCL 25 MG/ML IJ SOLN: 6.2500 mg | INTRAMUSCULAR | Status: DC | PRN

## 2021-10-30 MED ORDER — ORAL CARE MOUTH RINSE
15.0000 mL | Freq: Once | OROMUCOSAL | Status: AC
Start: 2021-10-30 — End: 2021-10-30

## 2021-10-30 MED ORDER — CHLORHEXIDINE GLUCONATE 0.12 % MT SOLN
15.0000 mL | Freq: Once | OROMUCOSAL | Status: AC
Start: 2021-10-30 — End: 2021-10-30

## 2021-10-30 MED ORDER — FENTANYL CITRATE (PF) 100 MCG/2ML IJ SOLN: 25.0000 ug | INTRAMUSCULAR | Status: DC | PRN

## 2021-10-30 MED ORDER — MIDAZOLAM HCL 2 MG/2ML IJ SOLN
INTRAMUSCULAR | Status: AC
  Filled 2021-10-30: qty 2

## 2021-10-30 MED ORDER — ONDANSETRON HCL 4 MG/2ML IJ SOLN
INTRAMUSCULAR | Status: DC | PRN
  Administered 2021-10-30: 4 mg via INTRAVENOUS

## 2021-10-30 MED ORDER — PROPOFOL 10 MG/ML IV BOLUS
INTRAVENOUS | Status: AC
  Filled 2021-10-30: qty 20

## 2021-10-30 MED ORDER — PROPOFOL 10 MG/ML IV BOLUS
INTRAVENOUS | Status: DC | PRN
  Administered 2021-10-30: 200 mg via INTRAVENOUS

## 2021-10-30 MED ORDER — PHENYLEPHRINE 80 MCG/ML (10ML) SYRINGE FOR IV PUSH (FOR BLOOD PRESSURE SUPPORT)
PREFILLED_SYRINGE | INTRAVENOUS | Status: DC | PRN
  Administered 2021-10-30: 80 ug via INTRAVENOUS

## 2021-10-30 MED ORDER — ONDANSETRON HCL 4 MG/2ML IJ SOLN
INTRAMUSCULAR | Status: AC
  Filled 2021-10-30: qty 2

## 2021-10-30 MED ORDER — BUPIVACAINE HCL (PF) 0.5 % IJ SOLN
INTRAMUSCULAR | Status: AC
  Filled 2021-10-30: qty 30

## 2021-10-30 MED ORDER — VANCOMYCIN HCL 1000 MG IV SOLR
INTRAVENOUS | Status: AC
  Filled 2021-10-30: qty 20

## 2021-10-30 MED ORDER — BUPIVACAINE HCL (PF) 0.5 % IJ SOLN
INTRAMUSCULAR | Status: DC | PRN
  Administered 2021-10-30: 10 mL

## 2021-10-30 MED ORDER — INSULIN ASPART 100 UNIT/ML IJ SOLN
0.0000 [IU] | INTRAMUSCULAR | Status: DC | PRN
  Administered 2021-10-30: 2 [IU] via SUBCUTANEOUS

## 2021-10-30 MED ORDER — MIDAZOLAM HCL 5 MG/5ML IJ SOLN
INTRAMUSCULAR | Status: DC | PRN
  Administered 2021-10-30: 2 mg via INTRAVENOUS

## 2021-10-30 MED ORDER — ONDANSETRON HCL 4 MG/2ML IJ SOLN
4.0000 mg | Freq: Once | INTRAMUSCULAR | Status: AC
  Administered 2021-10-30: 4 mg via INTRAVENOUS

## 2021-10-30 MED ORDER — OXYCODONE HCL 5 MG/5ML PO SOLN: 5.0000 mg | Freq: Once | ORAL | Status: DC | PRN

## 2021-10-30 MED ORDER — FENTANYL CITRATE (PF) 100 MCG/2ML IJ SOLN
INTRAMUSCULAR | Status: DC | PRN
  Administered 2021-10-30 (×3): 50 ug via INTRAVENOUS

## 2021-10-30 MED ORDER — OXYCODONE HCL 5 MG PO TABS: 5.0000 mg | ORAL_TABLET | Freq: Once | ORAL | Status: DC | PRN

## 2021-10-30 MED ORDER — ONDANSETRON HCL 4 MG/2ML IJ SOLN: 4.0000 mg | Freq: Four times a day (QID) | INTRAMUSCULAR | Status: DC | PRN

## 2021-10-30 MED ORDER — LACTATED RINGERS IV SOLN: INTRAVENOUS | Status: DC

## 2021-10-30 MED ORDER — DEXAMETHASONE SODIUM PHOSPHATE 10 MG/ML IJ SOLN
INTRAMUSCULAR | Status: AC
  Filled 2021-10-30: qty 1

## 2021-10-30 MED ORDER — FENTANYL CITRATE (PF) 250 MCG/5ML IJ SOLN
INTRAMUSCULAR | Status: AC
  Filled 2021-10-30: qty 5

## 2021-10-30 MED ORDER — LIDOCAINE 2% (20 MG/ML) 5 ML SYRINGE
INTRAMUSCULAR | Status: AC
  Filled 2021-10-30: qty 5

## 2021-10-30 MED ORDER — AMISULPRIDE (ANTIEMETIC) 5 MG/2ML IV SOLN: 10.0000 mg | Freq: Once | INTRAVENOUS | Status: DC | PRN

## 2021-10-30 SURGICAL SUPPLY — 32 items
BAG COUNTER SPONGE SURGICOUNT (BAG) ×2 IMPLANT
BNDG ELASTIC 6X5.8 VLCR STR LF (GAUZE/BANDAGES/DRESSINGS) ×2 IMPLANT
BNDG ESMARK 4X9 LF (GAUZE/BANDAGES/DRESSINGS) ×1 IMPLANT
COVER SURGICAL LIGHT HANDLE (MISCELLANEOUS) ×2 IMPLANT
DRSG ADAPTIC 3X8 NADH LF (GAUZE/BANDAGES/DRESSINGS) ×1 IMPLANT
ELECT CAUTERY BLADE 6.4 (BLADE) ×2 IMPLANT
ELECT REM PT RETURN 9FT ADLT (ELECTROSURGICAL) ×2
ELECTRODE REM PT RTRN 9FT ADLT (ELECTROSURGICAL) ×1 IMPLANT
GAUZE PAD ABD 8X10 STRL (GAUZE/BANDAGES/DRESSINGS) ×4 IMPLANT
GAUZE SPONGE 4X4 12PLY STRL (GAUZE/BANDAGES/DRESSINGS) ×2 IMPLANT
GLOVE BIO SURGEON STRL SZ7 (GLOVE) ×2 IMPLANT
GLOVE BIOGEL PI IND STRL 7.5 (GLOVE) ×1 IMPLANT
GLOVE BIOGEL PI INDICATOR 7.5 (GLOVE) ×1
GOWN STRL REUS W/ TWL LRG LVL3 (GOWN DISPOSABLE) ×2 IMPLANT
GOWN STRL REUS W/TWL LRG LVL3 (GOWN DISPOSABLE) ×2
GRAFT MYRIAD 5 LAYER 10X10 (Graft) ×1 IMPLANT
KIT BASIN OR (CUSTOM PROCEDURE TRAY) ×2 IMPLANT
KIT TURNOVER KIT B (KITS) ×2 IMPLANT
MANIFOLD NEPTUNE II (INSTRUMENTS) ×2 IMPLANT
NDL HYPO 25GX1X1/2 BEV (NEEDLE) IMPLANT
NEEDLE HYPO 25GX1X1/2 BEV (NEEDLE) ×2 IMPLANT
NS IRRIG 1000ML POUR BTL (IV SOLUTION) ×2 IMPLANT
PACK ORTHO EXTREMITY (CUSTOM PROCEDURE TRAY) ×2 IMPLANT
PAD ARMBOARD 7.5X6 YLW CONV (MISCELLANEOUS) ×3 IMPLANT
POWDER MYRIAD MORCELLS 500MG (Miscellaneous) ×1 IMPLANT
SOL PREP POV-IOD 4OZ 10% (MISCELLANEOUS) ×3 IMPLANT
STAPLER VISISTAT 35W (STAPLE) ×2 IMPLANT
SURGILUBE 2OZ TUBE FLIPTOP (MISCELLANEOUS) ×1 IMPLANT
SYR CONTROL 10ML LL (SYRINGE) ×1 IMPLANT
TOWEL GREEN STERILE (TOWEL DISPOSABLE) ×2 IMPLANT
TUBE CONNECTING 12X1/4 (SUCTIONS) ×2 IMPLANT
YANKAUER SUCT BULB TIP NO VENT (SUCTIONS) ×2 IMPLANT

## 2021-10-30 NOTE — Anesthesia Procedure Notes (Signed)
Procedure Name: LMA Insertion Date/Time: 10/30/2021 11:50 AM  Performed by: Shireen Quan, CRNAPre-anesthesia Checklist: Patient identified, Emergency Drugs available, Suction available and Patient being monitored Patient Re-evaluated:Patient Re-evaluated prior to induction Oxygen Delivery Method: Circle System Utilized Preoxygenation: Pre-oxygenation with 100% oxygen Induction Type: IV induction Ventilation: Mask ventilation without difficulty LMA: LMA inserted LMA Size: 5.0 Number of attempts: 1 Placement Confirmation: positive ETCO2 Tube secured with: Tape Dental Injury: Teeth and Oropharynx as per pre-operative assessment

## 2021-10-30 NOTE — Progress Notes (Signed)
PROGRESS NOTE    [name]  XIH:[id number] DOB: December 28, 1985 DOA: 10/26/2021 PCP: [name], MD   Brief Narrative:  [name] is a 36 y.o. 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  Recently been hospitalized in 6/11 -6/13 for possible gastroenteritis. Post hospitalization patient noted worsening right foot swelling with difficulty obtaining MRI due to insurance denial ultimately placed on antibiotics with worsening pain/swelling who now presents with MRI imaging showing soft tissue infection and possible osteomyelitis.  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 Home regimen: Lantus 40-80 units daily with meals. -Continue Semglee 30 units daily, sliding scale insulin,  hypoglycemic protocol   Diarrhea, transient, resolved -Patient reported having diarrhea 2 days prior to admission -C. difficile screening canceled, patient has not had a bowel movement in over 12 hours -this is not consistent with C. difficile infection   Chronic anemia of chronic disease -Continue to monitor   Obesity BMI 39.13 kg/m   DVT prophylaxis: Lovenox Code Status: Full Family Communication: None present  Status is: Inpatient  Dispo: The patient is from: Home              Anticipated d/c is to: Home              Anticipated d/c date is: 48 to 72 hours pending further surgical evaluation              Patient currently not medically stable for discharge  Consultants:  Podiatry  Procedures:  Right foot wound debridement and bone biopsy; repeat surgical evaluation/wound closure   Antimicrobials:  Vancomycin, Zosyn  Subjective: No acute issues or events overnight  Objective: Vitals:   10/28/21 0915 10/28/21 2017 10/29/21 0720 10/29/21 1926  BP: (!) 141/87 134/78 (!) 139/93 135/80  Pulse: 78 79 77 94  Resp: 16 15    Temp: 97.8 F (36.6 C) 97.8 F (36.6 C) 97.7 F (36.5 C) 98.4 F (36.9 C)  TempSrc: Oral Oral Oral Oral  SpO2: 99% 94% 93% 97%  Weight:      Height:        Intake/Output Summary (Last 24 hours) at 10/30/2021 0731 Last data filed at 10/29/2021 1952 Gross per 24 hour  Intake --  Output 1400 ml  Net -1400 ml    Filed Weights   10/26/21 1951  Weight: (!) 149.7 kg    Examination:  General:  Pleasantly resting in bed, No acute distress. HEENT:  Normocephalic atraumatic.  Sclerae nonicteric, noninjected.  Extraocular  movements intact bilaterally. Neck:  Without mass or deformity.  Trachea is midline. Lungs:  Clear to auscultate bilaterally without rhonchi, wheeze, or rales. Heart:  Regular rate and rhythm.  Without murmurs, rubs, or gallops. Abdomen: Obese, soft, nontender, nondistended.  Without guarding or rebound. Extremities:  Bilateral foot and ankle dressings, notable ray amputations of the fifth tarsal and metatarsal bilaterally Vascular:  Dorsalis pedis and posterior tibial pulses weakly palpable bilaterally. Skin:  Warm and dry, no erythema, no ulcerations.   Data Reviewed: I have personally reviewed following labs and imaging studies  CBC: Recent Labs  Lab 10/26/21 2027 10/28/21 0301 10/29/21 0516  WBC 5.9 5.3 4.7  NEUTROABS 3.4  --   --   HGB 10.9* 9.9* 10.2*  HCT 36.1* 32.6* 33.7*  MCV 84.1 83.6 82.6  PLT 309 273 265    Basic Metabolic Panel: Recent Labs  Lab 10/26/21 2027 10/28/21 0301 10/29/21 0516  NA 138 138 137  K 4.5 4.4 4.3  CL 106 105 106  CO2 26 27 26   GLUCOSE 161* 172* 189*  BUN 14 11 8   CREATININE 1.22 1.05 1.04  CALCIUM 9.2 8.8* 8.9    GFR: Estimated Creatinine Clearance: 158.9 mL/min (by C-G formula based on SCr of 1.04 mg/dL). Liver Function Tests: Recent Labs  Lab 10/26/21 2027  AST 17  ALT 19  ALKPHOS 123  BILITOT <0.1*  PROT 7.5  ALBUMIN 3.2*    No results for input(s): "LIPASE", "AMYLASE" in the last 168 hours. No results for input(s): "AMMONIA" in the last 168 hours. Coagulation Profile: No results for input(s): "INR", "PROTIME" in the last 168 hours. Cardiac Enzymes: No results for input(s): "CKTOTAL", "CKMB", "CKMBINDEX", "TROPONINI" in the last 168 hours. BNP (last 3 results) No results for input(s): "PROBNP" in the last 8760 hours. HbA1C: No results for input(s): "HGBA1C" in the last 72 hours. CBG: Recent Labs  Lab 10/28/21 2020 10/29/21 0721 10/29/21 1209 10/29/21 1616 10/29/21 2016  GLUCAP 162* 176* 155* 179* 225*    Lipid Profile: No results for input(s): "CHOL", "HDL", "LDLCALC", "TRIG", "CHOLHDL", "LDLDIRECT" in the last 72 hours. Thyroid Function Tests: No results for input(s): "TSH", "T4TOTAL", "FREET4", "T3FREE", "THYROIDAB" in the last 72 hours. Anemia Panel: No results for input(s): "VITAMINB12", "FOLATE", "FERRITIN",  "TIBC", "IRON", "RETICCTPCT" in the last 72 hours. Sepsis Labs: Recent Labs  Lab 10/26/21 2028  LATICACIDVEN 1.4     Recent Results (from the past 240 hour(s))  Culture, blood (routine x 2)     Status: None (Preliminary result)   Collection Time: 10/26/21  8:28 PM   Specimen: BLOOD  Result Value Ref Range Status   Specimen Description BLOOD BLOOD LEFT ARM  Final   Special Requests   Final    BOTTLES DRAWN AEROBIC AND ANAEROBIC Blood Culture adequate volume   Culture   Final    NO GROWTH 3 DAYS Performed at [facility], [address], [place], [address] [place]    Report Status PENDING  Incomplete  Culture, blood (routine x 2)     Status: None (Preliminary result)   Collection Time: 10/26/21  8:28 PM   Specimen: BLOOD  Result Value Ref Range Status   Specimen Description BLOOD BLOOD RIGHT ARM  Final   Special Requests   Final    BOTTLES DRAWN AEROBIC ONLY Blood Culture results may not be optimal due to an excessive volume of blood received in culture bottles   Culture   Final    NO GROWTH 3  DAYS Performed at [facility], [address], [place], [place] [address]    Report Status PENDING  Incomplete  Surgical pcr screen     Status: None   Collection Time: 10/27/21  5:01 PM   Specimen: Nasal Mucosa; Nasal Swab  Result Value Ref Range Status   MRSA, PCR NEGATIVE NEGATIVE Final   Staphylococcus aureus NEGATIVE NEGATIVE Final    Comment: (NOTE) The Xpert SA Assay (FDA approved for NASAL specimens in patients 42 years of age and older), is one component of a comprehensive surveillance program. It is not intended to diagnose infection nor to guide or monitor treatment. Performed at [facility], [address], [place], [place] [address]   Aerobic/Anaerobic Culture w Gram Stain (surgical/deep wound)     Status: None (Preliminary result)   Collection Time: 10/27/21  6:38 PM   Specimen: PATH Bone biopsy; Tissue  Result Value Ref Range Status   Specimen  Description TISSUE  Final   Special Requests RIGHT 5TH METATARSAL ID3  Final   Gram Stain   Final    FEW WBC PRESENT,BOTH PMN AND MONONUCLEAR NO ORGANISMS SEEN    Culture   Final    NO GROWTH 2 DAYS NO ANAEROBES ISOLATED; CULTURE IN PROGRESS FOR 5 DAYS Performed at [facility], [address], [place], [place] [address]    Report Status PENDING  Incomplete  Aerobic/Anaerobic Culture w Gram Stain (surgical/deep wound)     Status: None (Preliminary result)   Collection Time: 10/27/21  6:39 PM   Specimen: PATH Bone biopsy; Tissue  Result Value Ref Range Status   Specimen Description TISSUE  Final   Special Requests RIGHT CUBOID ID4  Final   Gram Stain NO WBC SEEN NO ORGANISMS SEEN   Final   Culture   Final    NO GROWTH 2 DAYS NO ANAEROBES ISOLATED; CULTURE IN PROGRESS FOR 5 DAYS Performed at [facility], [address], [place], [place] [address]    Report Status PENDING  Incomplete         Radiology Studies: No results found.      Scheduled Meds:  chlorhexidine  15 mL Mouth/Throat Once   Or   mouth rinse  15 mL Mouth Rinse Once   enoxaparin (LOVENOX) injection  75 mg Subcutaneous Daily   insulin aspart  0-15 Units Subcutaneous TID WC   insulin aspart  0-5 Units Subcutaneous QHS   insulin glargine-yfgn  30 Units Subcutaneous Daily   sodium chloride flush  3 mL Intravenous Q12H   Continuous Infusions:  lactated ringers 125 mL/hr at 10/29/21 1646   lactated ringers     piperacillin-tazobactam (ZOSYN)  IV 3.375 g (10/30/21 0519)   vancomycin 1,500 mg (10/29/21 2049)     LOS: 3 days   Time spent:  Azucena Fallen, DO [facility]  If 7PM-7AM, please contact night-coverage [email or web address]  10/30/2021, 7:31 AM

## 2021-10-30 NOTE — Progress Notes (Signed)
History and Physical Interval Note:  10/30/2021 11:38 AM  [name]  has presented today for surgery, with the diagnosis of bilateral ulcerations.  The various methods of treatment have been discussed with the patient and family. After consideration of risks, benefits and other options for treatment, the patient has consented to   Procedure(s): DEBRIDEMENT AND GRAFT APPLICATION (Bilateral) as a surgical intervention.  The patient's history has been reviewed, patient examined, no change in status, stable for surgery.  I have reviewed the patient's chart and labs.  Questions were answered to the patient's satisfaction.     Edwin Cap

## 2021-10-30 NOTE — Progress Notes (Signed)
Orthopedic Tech Progress Note Patient Details:  [name] 1985/11/08 [id number]  Ortho Devices Type of Ortho Device: Postop shoe/boot Ortho Device/Splint Location: Bil LE Ortho Device/Splint Interventions: Application   Post Interventions Patient Tolerated: Well  [name] 10/30/2021, 1:54 PM

## 2021-10-30 NOTE — Op Note (Signed)
Patient Name: [name] DOB: May 06, 1985  MRN: [id number]   Date of Service: 10/26/2021 - 10/30/2021  Surgeon: Dr. Lanae Crumbly, DPM Assistants: None Pre-operative Diagnosis:  Bilateral neuropathic ulcerations Post-operative Diagnosis:  ilateral neuropathic ulcerations Procedures:  1) prep recipient wound bed 35 square centimeters  2) application skin substitute 35 cm Pathology/Specimens: ID Type Source Tests Collected by Time Destination  A : Right Foot Wound Culture  Wound Wound AEROBIC/ANAEROBIC CULTURE W GRAM STAIN (SURGICAL/DEEP WOUND) Criselda Peaches, DPM 10/30/2021 1209    Anesthesia: general Hemostasis: * No tourniquets in log * Estimated Blood Loss: 10 mL Materials:  Implant Name Type Inv. Item Serial No. Manufacturer Lot No. LRB No. Used Action  POWDER MYRIAD MORCELLS 500MG - [id number] Miscellaneous POWDER MYRIAD MORCELLS 500MG  [employer] [id number] Bilateral 1 Implanted  GRAFT MYRIAD 5 LAYER 10X10 - [id number] Graft GRAFT MYRIAD 5 LAYER 10X10  [employer] [id number] Bilateral 1 Implanted   Medications: 10cc 0.5% marcaine plain Complications: none  Indications for Procedure:  This is a 36 y.o. male with a history of type 2 diabetes and bilateral neuropathic ulcerations.  He previously underwent bone biopsy earlier this week.  This was negative for osteomyelitis.  Skin substitute application was recommended to accelerate his wound healing.  All risk benefits potential complications were discussed prior to surgery.  Informed consent was signed and reviewed prior to surgery.   Procedure in Detail: Patient was identified in pre-operative holding area. Formal consent was signed and the bilateral lower extremity was marked. Patient was brought back to the operating room. Anesthesia was induced. The extremity was prepped and draped in the usual sterile fashion. Timeout was taken to confirm patient name, laterality, and procedure prior to  incision.   Attention was then directed to the bilateral foot which exhibited four ulcerations: Left dorsal foot measuring 1.5cm x 1.0cm, left plantar foot measuring 3.5cm x 5.0cm, right dorsal foot measuring 1.5cm x 1.5cm, right plantar foot measuring 3.5cm x 4.0cm.  All wounds had surrounding hyperkeratosis and a fibrogranular wound bed.  Both ulcerations on the plantar portion of the foot had sinus tracts to the central portion.  The left was deeper than the right.  There was some serous drainage in the right dorsal foot this was cultured.  Each wound bed was then thoroughly prepped of removal of all nonviable tissue in an excisional manner using scalpel, curette and rongeur.  Once thorough preparation was complete all wounds were thoroughly irrigated with normal sterile saline.  Myriad Matrix was then applied to each wound bed.  The plantar ulcerations were first prepared by placing the morsels into the sinus tract to fill the depth.  Appropriately sized graft was then inset into each wound bed.  It was secured with skin staples.  Adaptic and surgical lube was then applied over this the foot was then dressed with dry sterile dressings with gauze, ABD pad and Ace wrap under light compression.  He tolerated procedure well.    Disposition: Following a period of post-operative monitoring, patient will be transferred to the floor.

## 2021-10-30 NOTE — Transfer of Care (Signed)
Immediate Anesthesia Transfer of Care Note  Patient: [name]  Procedure(s) Performed: DEBRIDEMENT AND GRAFT APPLICATION (Bilateral: Foot)  Patient Location: PACU  Anesthesia Type:General  Level of Consciousness: drowsy  Airway & Oxygen Therapy: Patient Spontanous Breathing and Patient connected to nasal cannula oxygen  Post-op Assessment: Report given to RN and Post -op Vital signs reviewed and stable  Post vital signs: Reviewed and stable  Last Vitals:  Vitals Value Taken Time  BP 115/69 10/30/21 1251  Temp    Pulse 82 10/30/21 1254  Resp 15 10/30/21 1254  SpO2 92 % 10/30/21 1254  Vitals shown include unvalidated device data.  Last Pain:  Vitals:   10/30/21 0819  TempSrc: Oral  PainSc:          Complications: No notable events documented.

## 2021-10-30 NOTE — Anesthesia Postprocedure Evaluation (Signed)
Anesthesia Post Note  Patient: [name]  Procedure(s) Performed: DEBRIDEMENT AND GRAFT APPLICATION (Bilateral: Foot)     Patient location during evaluation: PACU Anesthesia Type: General Level of consciousness: awake Pain management: pain level controlled Vital Signs Assessment: post-procedure vital signs reviewed and stable Respiratory status: spontaneous breathing, nonlabored ventilation, respiratory function stable and patient connected to nasal cannula oxygen Cardiovascular status: blood pressure returned to baseline and stable Postop Assessment: no apparent nausea or vomiting Anesthetic complications: no   No notable events documented.  Last Vitals:  Vitals:   10/30/21 1335 10/30/21 2002  BP: (!) 145/84 125/69  Pulse: 72 88  Resp:  18  Temp: 36.6 C 36.8 C  SpO2: 92% 94%    Last Pain:  Vitals:   10/30/21 2002  TempSrc: Oral  PainSc:                  Catheryn Bacon Breeze Berringer

## 2021-10-30 NOTE — Anesthesia Preprocedure Evaluation (Addendum)
Anesthesia Evaluation  Patient identified by MRN, date of birth, ID band Patient awake    Reviewed: Allergy & Precautions, NPO status , Patient's Chart, lab work & pertinent test results  Airway Mallampati: II  TM Distance: >3 FB Neck ROM: Full    Dental  (+) Chipped, Missing,    Pulmonary neg pulmonary ROS,    Pulmonary exam normal        Cardiovascular negative cardio ROS Normal cardiovascular exam     Neuro/Psych negative neurological ROS  negative psych ROS   GI/Hepatic negative GI ROS, Neg liver ROS,   Endo/Other  diabetes, Insulin Dependent  Renal/GU negative Renal ROS     Musculoskeletal  (+) Arthritis ,   Abdominal (+) + obese,   Peds  Hematology  (+) Blood dyscrasia, anemia ,   Anesthesia Other Findings BILATERAL FOOT ULCERS  Reproductive/Obstetrics                            Anesthesia Physical Anesthesia Plan  ASA: 3  Anesthesia Plan: General   Post-op Pain Management:    Induction: Intravenous  PONV Risk Score and Plan: 2 and Ondansetron, Dexamethasone, Midazolam and Treatment may vary due to age or medical condition  Airway Management Planned: LMA  Additional Equipment:   Intra-op Plan:   Post-operative Plan: Extubation in OR  Informed Consent: I have reviewed the patients History and Physical, chart, labs and discussed the procedure including the risks, benefits and alternatives for the proposed anesthesia with the patient or authorized representative who has indicated his/her understanding and acceptance.     Dental advisory given  Plan Discussed with: CRNA  Anesthesia Plan Comments:        Anesthesia Quick Evaluation

## 2021-10-31 DIAGNOSIS — E11628 Type 2 diabetes mellitus with other skin complications: Secondary | ICD-10-CM | POA: Diagnosis not present

## 2021-10-31 DIAGNOSIS — L089 Local infection of the skin and subcutaneous tissue, unspecified: Secondary | ICD-10-CM | POA: Diagnosis not present

## 2021-10-31 LAB — GLUCOSE, CAPILLARY: Glucose-Capillary: 225 mg/dL — ABNORMAL HIGH (ref 70–99)

## 2021-10-31 LAB — CULTURE, BLOOD (ROUTINE X 2)
Culture: NO GROWTH
Culture: NO GROWTH
Special Requests: ADEQUATE

## 2021-10-31 LAB — BASIC METABOLIC PANEL
Anion gap: 6 (ref 5–15)
BUN: 8 mg/dL (ref 6–20)
CO2: 25 mmol/L (ref 22–32)
Calcium: 8.9 mg/dL (ref 8.9–10.3)
Chloride: 105 mmol/L (ref 98–111)
Creatinine, Ser: 1 mg/dL (ref 0.61–1.24)
GFR, Estimated: >60 mL/min (ref >60)
Glucose, Bld: 250 mg/dL — ABNORMAL HIGH (ref 70–99)
Potassium: 4.2 mmol/L (ref 3.5–5.1)
Sodium: 136 mmol/L (ref 135–145)

## 2021-10-31 LAB — CBC
HCT: 33.1 % — ABNORMAL LOW (ref 39.0–52.0)
Hemoglobin: 10.4 g/dL — ABNORMAL LOW (ref 13.0–17.0)
MCH: 25.4 pg — ABNORMAL LOW (ref 26.0–34.0)
MCHC: 31.4 g/dL (ref 30.0–36.0)
MCV: 80.9 fL (ref 80.0–100.0)
Platelets: 256 10*3/uL (ref 150–400)
RBC: 4.09 MIL/uL — ABNORMAL LOW (ref 4.22–5.81)
RDW: 15.2 % (ref 11.5–15.5)
WBC: 6.7 10*3/uL (ref 4.0–10.5)
nRBC: 0.3 % — ABNORMAL HIGH (ref 0.0–0.2)

## 2021-10-31 MED ORDER — SULFAMETHOXAZOLE-TRIMETHOPRIM 800-160 MG PO TABS: 1.0000 | ORAL_TABLET | Freq: Two times a day (BID) | ORAL | 0 refills | Status: AC

## 2021-10-31 NOTE — TOC Transition Note (Signed)
Transition of Care [facility]) - CM/SW Discharge Note   Patient Details  Name: [name] MRN: [id number] Date of Birth: 1985-04-27  Transition of Care [facility]) CM/SW Contact:  Lawerance Sabal, RN Phone Number: 10/31/2021, 11:01 AM   Clinical Narrative:   RW to be delivered to the room prior to DC    Final next level of care: Home/Self Care Barriers to Discharge: Continued Medical Work up   Patient Goals and CMS Choice Patient states their goals for this hospitalization and ongoing recovery are:: To get better and go home CMS Medicare.gov Compare Post Acute Care list provided to:: Patient Choice offered to / list presented to : Patient  Discharge Placement                       Discharge Plan and Services                                     Social Determinants of Health (SDOH) Interventions     Readmission Risk Interventions    10/29/2021    7:51 AM  Readmission Risk Prevention Plan  Post Dischage Appt Complete  Medication Screening Complete  Transportation Screening Complete

## 2021-10-31 NOTE — Discharge Summary (Signed)
Physician Discharge Summary  [name] [id number] DOB: 1985-07-21 DOA: 10/26/2021  PCP: [name], MD  Admit date: 10/26/2021 Discharge date: 10/31/2021  Admitted From: Home Disposition:  Home  Recommendations for Outpatient Follow-up:  Follow up with PCP in 1-2 weeks Follow up with podiatry as scheduled  Home Health:None  Equipment/Devices:None  Discharge Condition:Stable  CODE STATUS:Full  Diet recommendation: Diabetic    Brief/Interim Summary: [name] is a 36 y.o. 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  Recently been hospitalized in 6/11 -6/13 for possible gastroenteritis. Post hospitalization patient noted worsening right foot swelling with difficulty obtaining MRI due to insurance denial ultimately placed on antibiotics with worsening pain/swelling who now presents with MRI imaging showing soft tissue infection and possible osteomyelitis.  Podiatry evaluated - tolerated debridement, biopsy, and skin grafting well - no overt osteo. Will continue bactrim for 7 days per podiatry given history and risk for infection. Continue home meds as below. Close outpatient follow up with PCP and podiatry as scheduled.  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 ([place])   Diabetic ulcer of left midfoot associated with type 1  diabetes mellitus, with muscle involvement without evidence of necrosis [facility])    Discharge Instructions   Allergies as of 10/31/2021       Reactions   Basaglar Kwikpen [insulin Glargine] Diarrhea   Metformin And Related Diarrhea, Nausea And Vomiting   Trulicity [dulaglutide] Diarrhea        Medication List     STOP taking these medications    insulin aspart 100 UNIT/ML injection Commonly known as: novoLOG   lidocaine 5 % Commonly known as: Lidoderm   metoCLOPramide 5 MG tablet Commonly known as: REGLAN   silver sulfADIAZINE 1 % cream Commonly known as: Silvadene       TAKE these medications    FreeStyle Libre 14 Day Sensor Misc Inject 1 patch into the skin every 14 (fourteen) days.   Continuous Blood Gluc Sensor Misc 1 each by Does not apply route as directed. Use as directed every 14 days. May dispense FreeStyle [employer] or similar.   Lantus SoloStar 100 UNIT/ML Solostar Pen Generic drug: insulin glargine Inject 40 Units into the skin daily before breakfast. What changed:  how much to take additional instructions   Omnipod DASH Pods (Gen 4) Misc Inject into the skin.   sulfamethoxazole-trimethoprim 800-160 MG tablet Commonly known as: BACTRIM DS Take 1 tablet by mouth 2 (two) times daily for 7 days.               Durable Medical Equipment  (From admission, onward)           Start     Ordered   10/31/21 1101  For home use only DME Walker rolling  Once       Question Answer Comment  Walker: With 5 Inch Wheels   Patient needs a walker to treat with  the following condition Weakness      10/31/21 1101            Follow-up Information     Talmage Coin, MD. Schedule an appointment as soon as possible for a visit.   Specialty: Endocrinology Why: Please schedule an appointment and follow up with the clinic post-hospitalization. Contact information: [address] [place] [address] [phone]          [name], MD. Schedule an appointment as soon as possible for a visit.   Specialty: Internal Medicine Why: Please schedule a clinic appointment within 7-10 days discharge home from the hospital. Contact information: [address] [name] [address] [phone]                Allergies  Allergen Reactions   Basaglar Stephanie Coup [Insulin Glargine] Diarrhea   Metformin And Related Diarrhea and Nausea And Vomiting   Trulicity [Dulaglutide] Diarrhea    Consultations: Podiatry  Procedures/Studies: MR FOOT RIGHT W WO CONTRAST  Result Date: 10/27/2021 CLINICAL DATA:  Diabetic with foot swelling. Concern for osteomyelitis. Patient reports swelling and wounds for more than 1 month for which the patient is taking antibiotics. EXAM: MRI OF THE RIGHT FOREFOOT WITHOUT AND WITH CONTRAST TECHNIQUE: Multiplanar, multisequence MR imaging of the right forefoot was performed before and after the administration of intravenous contrast. CONTRAST:  69mL GADAVIST GADOBUTROL 1 MMOL/ML IV SOLN COMPARISON:  Radiographs 10/26/2021 and 09/02/2021.  MRI 09/29/2019. FINDINGS: Despite efforts by the technologist and patient, mild motion artifact is present on today's exam and could not be eliminated. This reduces exam sensitivity and specificity. Bones/Joint/Cartilage Since the previous MRI, the patient has undergone amputation through the mid shaft of the 5th metatarsal. There is a soft tissue wound lateral to the base of the 5th metatarsal associated with extensive inflammatory changes in the surrounding fat, incompletely visualized. The soft tissue findings are further described below. There are extensive marrow signal abnormalities throughout the 2nd through 5th metatarsal bases, the cuboid, middle and lateral cuneiform is which are progressive on the recent radiographs compared with the prior radiographs from 09/02/2021. There is osseous fragmentation with marrow edema and enhancement.  The 4th and 5th metatarsals appear subluxed posterolaterally at the Lisfranc joint. The 1st metatarsal appears unremarkable. The 1st through 4th digits appear unremarkable. Ligaments Intact Lisfranc ligament. Muscles and Tendons Diffuse T2 hyperintensity throughout the forefoot musculature, likely due to diabetes. No intramuscular fluid collection or definite tenosynovitis. Soft tissues As above, soft tissue ulceration lateral to the base of the 5th metatarsal with extensive edema and enhancement throughout the soft tissues of the lateral midfoot and forefoot. There is a complex peripherally enhancing fluid collection between the cuboid and bases of the 4th and 5th metatarsals, measuring up to 2.8 x 1.9 x 2.7 cm, suspicious for an abscess. There are additional scattered peripherally enhancing fluid collections, including one posterior to the mid 4th metatarsal shaft which measures up to 1.5 cm on image 13/11. As noted, these inflammatory changes are incompletely visualized on this examination which does not include the hindfoot. IMPRESSION: 1. Extensive abnormalities of the 2nd through 5th metatarsal bases, the cuboid, middle and lateral cuneiforms as described. These findings have rapidly progressed from radiographs done 09/02/2021, suspicious for aggressive Charcot (neuropathic) joint. 2. In addition, there is an open lateral midfoot wound with extensive surrounding inflammatory changes and multiple peripherally enhancing fluid collections, suspicious for superimposed soft tissue infection. As the soft tissue findings of but the osseous  changes, osteomyelitis may be present as well. 3. The proximal extent of these findings is not imaged on this examination of the forefoot. Consider further evaluation of the hindfoot/ankle if that would influence the patient's surgical treatment. Electronically Signed   By: Richardean Sale M.D.   On: 10/27/2021 09:27     Subjective: No acute issues/events  overnight   Discharge Exam: Vitals:   10/30/21 2002 10/31/21 0748  BP: 125/69 (!) 143/78  Pulse: 88 77  Resp: 18 16  Temp: 98.2 F (36.8 C) 98 F (36.7 C)  SpO2: 94% 94%   Vitals:   10/30/21 1320 10/30/21 1335 10/30/21 2002 10/31/21 0748  BP: 122/77 (!) 145/84 125/69 (!) 143/78  Pulse: 71 72 88 77  Resp: 11  18 16   Temp: 98.4 F (36.9 C) 97.8 F (36.6 C) 98.2 F (36.8 C) 98 F (36.7 C)  TempSrc:  Oral Oral Oral  SpO2: 92% 92% 94% 94%  Weight:      Height:        General:  Pleasantly resting in bed, No acute distress. HEENT:  Normocephalic atraumatic.  Sclerae nonicteric, noninjected.  Extraocular movements intact bilaterally. Neck:  Without mass or deformity.  Trachea is midline. Lungs:  Clear to auscultate bilaterally without rhonchi, wheeze, or rales. Heart:  Regular rate and rhythm.  Without murmurs, rubs, or gallops. Abdomen: Obese, soft, nontender, nondistended.  Without guarding or rebound. Extremities: Bilateral foot and ankle dressings, notable ray amputations of the fifth tarsal and metatarsal bilaterally Vascular:  Dorsalis pedis and posterior tibial pulses weakly palpable bilaterally.   The results of significant diagnostics from this hospitalization (including imaging, microbiology, ancillary and laboratory) are listed below for reference.     Microbiology: Recent Results (from the past 240 hour(s))  Culture, blood (routine x 2)     Status: None   Collection Time: 10/26/21  8:28 PM   Specimen: BLOOD  Result Value Ref Range Status   Specimen Description BLOOD BLOOD LEFT ARM  Final   Special Requests   Final    BOTTLES DRAWN AEROBIC AND ANAEROBIC Blood Culture adequate volume   Culture   Final    NO GROWTH 5 DAYS Performed at [facility], [address] [place], [place] [address]    Report Status 10/31/2021 FINAL  Final  Culture, blood (routine x 2)     Status: None   Collection Time: 10/26/21  8:28 PM   Specimen: BLOOD  Result Value Ref  Range Status   Specimen Description BLOOD BLOOD RIGHT ARM  Final   Special Requests   Final    BOTTLES DRAWN AEROBIC ONLY Blood Culture results may not be optimal due to an excessive volume of blood received in culture bottles   Culture   Final    NO GROWTH 5 DAYS Performed at [facility], [place] [address], [place],  [address]    Report Status 10/31/2021 FINAL  Final  Surgical pcr screen     Status: None   Collection Time: 10/27/21  5:01 PM   Specimen: Nasal Mucosa; Nasal Swab  Result Value Ref Range Status   MRSA, PCR NEGATIVE NEGATIVE Final   Staphylococcus aureus NEGATIVE NEGATIVE Final    Comment: (NOTE) The Xpert SA Assay (FDA approved for NASAL specimens in patients 67 years of age and older), is one component of a comprehensive surveillance program. It is not intended to diagnose infection nor to guide or monitor treatment. Performed at [facility], [place]  [address] [place], [place] [address]   Aerobic/Anaerobic Culture w Gram Stain (surgical/deep wound)     Status: None (Preliminary result)   Collection Time: 10/27/21  6:38 PM   Specimen: PATH Bone biopsy; Tissue  Result Value Ref Range Status   Specimen Description TISSUE  Final   Special Requests RIGHT 5TH METATARSAL ID3  Final   Gram Stain   Final    FEW WBC PRESENT,BOTH PMN AND MONONUCLEAR NO ORGANISMS SEEN    Culture   Final    NO GROWTH 4 DAYS NO ANAEROBES ISOLATED; CULTURE IN PROGRESS FOR 5 DAYS Performed at [facility], [address], [place], [place] [address]    Report Status PENDING  Incomplete  Aerobic/Anaerobic Culture w Gram Stain (surgical/deep wound)     Status: None (Preliminary result)   Collection Time: 10/27/21  6:39 PM   Specimen: PATH Bone biopsy; Tissue  Result Value Ref Range Status   Specimen Description TISSUE  Final   Special Requests RIGHT CUBOID ID4  Final   Gram Stain NO WBC SEEN NO ORGANISMS SEEN   Final   Culture   Final    NO GROWTH 4 DAYS NO ANAEROBES  ISOLATED; CULTURE IN PROGRESS FOR 5 DAYS Performed at [facility], [address], [place], [place] [address]    Report Status PENDING  Incomplete  Aerobic/Anaerobic Culture w Gram Stain (surgical/deep wound)     Status: None (Preliminary result)   Collection Time: 10/30/21 12:09 PM   Specimen: Wound  Result Value Ref Range Status   Specimen Description WOUND  Final   Special Requests NONE  Final   Gram Stain   Final    NO SQUAMOUS EPITHELIAL CELLS SEEN NO WBC SEEN NO ORGANISMS SEEN    Culture   Final    NO GROWTH < 24 HOURS Performed at [facility], [address] [place], [place] [address]    Report Status PENDING  Incomplete     Labs: BNP (last 3 results) No results for input(s): "BNP" in the last 8760 hours. Basic Metabolic Panel: Recent Labs  Lab 10/26/21 2027 10/28/21 0301 10/29/21 0516 10/31/21 0537  NA 138 138 137 136  K 4.5 4.4 4.3 4.2  CL 106 105 106 105  CO2 26 27 26 25   GLUCOSE 161* 172* 189* 250*  BUN 14 11 8 8   CREATININE 1.22 1.05 1.04 1.00  CALCIUM 9.2 8.8* 8.9 8.9   Liver Function Tests: Recent Labs  Lab 10/26/21 2027  AST 17  ALT 19  ALKPHOS 123  BILITOT <0.1*  PROT 7.5  ALBUMIN 3.2*   No results for input(s): "LIPASE", "AMYLASE" in the last 168 hours. No results for input(s): "AMMONIA" in the last 168 hours. CBC: Recent Labs  Lab 10/26/21 2027 10/28/21 0301 10/29/21 0516 10/31/21 0537  WBC 5.9 5.3 4.7 6.7  NEUTROABS 3.4  --   --   --   HGB 10.9* 9.9* 10.2* 10.4*  HCT 36.1* 32.6* 33.7* 33.1*  MCV 84.1 83.6 82.6 80.9  PLT 309 273 265 256   Cardiac Enzymes: No results for input(s): "CKTOTAL", "CKMB", "CKMBINDEX", "TROPONINI" in the last 168 hours. BNP: Invalid input(s): "POCBNP" CBG: Recent Labs  Lab 10/30/21 1114 10/30/21 1256 10/30/21 1507 10/30/21 2141 10/31/21 0751  GLUCAP 184* 164* 137* 286* 225*   D-Dimer No results for input(s): "DDIMER" in the last 72 hours. Hgb A1c No results for input(s):  "HGBA1C" in the last 72 hours. Lipid Profile No results for  input(s): "CHOL", "HDL", "LDLCALC", "TRIG", "CHOLHDL", "LDLDIRECT" in the last 72 hours. Thyroid function studies No results for input(s): "TSH", "T4TOTAL", "T3FREE", "THYROIDAB" in the last 72 hours.  Invalid input(s): "FREET3" Anemia work up No results for input(s): "VITAMINB12", "FOLATE", "FERRITIN", "TIBC", "IRON", "RETICCTPCT" in the last 72 hours. Urinalysis    Component Value Date/Time   COLORURINE YELLOW 09/13/2021 0429   APPEARANCEUR CLOUDY (A) 09/13/2021 0429   LABSPEC 1.024 09/13/2021 0429   PHURINE 5.0 09/13/2021 0429   GLUCOSEU NEGATIVE 09/13/2021 0429   HGBUR SMALL (A) 09/13/2021 0429   BILIRUBINUR NEGATIVE 09/13/2021 0429   KETONESUR NEGATIVE 09/13/2021 0429   PROTEINUR 30 (A) 09/13/2021 0429   UROBILINOGEN 0.2 11/07/2011 2241   NITRITE NEGATIVE 09/13/2021 0429   LEUKOCYTESUR TRACE (A) 09/13/2021 0429   Sepsis Labs Recent Labs  Lab 10/26/21 2027 10/28/21 0301 10/29/21 0516 10/31/21 0537  WBC 5.9 5.3 4.7 6.7   Microbiology Recent Results (from the past 240 hour(s))  Culture, blood (routine x 2)     Status: None   Collection Time: 10/26/21  8:28 PM   Specimen: BLOOD  Result Value Ref Range Status   Specimen Description BLOOD BLOOD LEFT ARM  Final   Special Requests   Final    BOTTLES DRAWN AEROBIC AND ANAEROBIC Blood Culture adequate volume   Culture   Final    NO GROWTH 5 DAYS Performed at [facility], [address] [place], [place] [address]    Report Status 10/31/2021 FINAL  Final  Culture, blood (routine x 2)     Status: None   Collection Time: 10/26/21  8:28 PM   Specimen: BLOOD  Result Value Ref Range Status   Specimen Description BLOOD BLOOD RIGHT ARM  Final   Special Requests   Final    BOTTLES DRAWN AEROBIC ONLY Blood Culture results may not be optimal due to an excessive volume of blood received in culture bottles   Culture   Final    NO GROWTH 5 DAYS Performed at [facility], [place] [address], [place], [place] [address]    Report Status 10/31/2021 FINAL  Final  Surgical pcr screen     Status: None   Collection Time: 10/27/21  5:01 PM   Specimen: Nasal Mucosa; Nasal Swab  Result Value Ref Range Status   MRSA, PCR NEGATIVE NEGATIVE Final   Staphylococcus aureus NEGATIVE NEGATIVE Final    Comment: (NOTE) The Xpert SA Assay (FDA approved for NASAL specimens in patients 87 years of age and older), is one component of a comprehensive surveillance program. It is not intended to diagnose infection nor to guide or monitor treatment. Performed at [facility], [place] [address], [place], [place] [address]   Aerobic/Anaerobic Culture w Gram Stain (surgical/deep wound)     Status: None (Preliminary result)   Collection Time: 10/27/21  6:38 PM   Specimen: PATH Bone biopsy; Tissue  Result Value Ref Range Status   Specimen Description TISSUE  Final   Special Requests RIGHT 5TH METATARSAL ID3  Final   Gram Stain   Final    FEW WBC PRESENT,BOTH PMN AND MONONUCLEAR NO ORGANISMS SEEN    Culture   Final    NO GROWTH 4 DAYS NO ANAEROBES ISOLATED; CULTURE IN PROGRESS FOR 5 DAYS Performed at [facility], [address], [place], [place] [address]    Report Status PENDING  Incomplete  Aerobic/Anaerobic Culture w Gram Stain (surgical/deep wound)     Status: None (  Preliminary result)   Collection Time: 10/27/21  6:39 PM   Specimen: PATH Bone biopsy; Tissue  Result Value Ref Range Status   Specimen Description TISSUE  Final   Special Requests RIGHT CUBOID ID4  Final   Gram Stain NO WBC SEEN NO ORGANISMS SEEN   Final   Culture   Final    NO GROWTH 4 DAYS NO ANAEROBES ISOLATED; CULTURE IN PROGRESS FOR 5 DAYS Performed at [facility], [address], [place], [place] [address]    Report Status PENDING  Incomplete  Aerobic/Anaerobic Culture w Gram Stain (surgical/deep wound)     Status: None (Preliminary result)   Collection Time: 10/30/21  12:09 PM   Specimen: Wound  Result Value Ref Range Status   Specimen Description WOUND  Final   Special Requests NONE  Final   Gram Stain   Final    NO SQUAMOUS EPITHELIAL CELLS SEEN NO WBC SEEN NO ORGANISMS SEEN    Culture   Final    NO GROWTH < 24 HOURS Performed at [facility], [place] [address], [place], [place] [address]    Report Status PENDING  Incomplete     Time coordinating discharge: Over 30 minutes  SIGNED:   Little Ishikawa, DO [facility] 10/31/2021, 5:15 PM Pager   If 7PM-7AM, please contact night-coverage [email or web address]

## 2021-10-31 NOTE — Progress Notes (Signed)
Subjective:  Patient ID: [name], male    DOB: [birth year],  MRN: [id number]  Doing well. Not having any pain  Negative for chest pain and shortness of breath Constitutional signs: no Review of all other systems is negative Objective:   Vitals:   10/30/21 2002 10/31/21 0748  BP: 125/69 (!) 143/78  Pulse: 88 77  Resp: 18 16  Temp: 98.2 F (36.8 C) 98 F (36.7 C)  SpO2: 94% 94%   General AA&O x3. Normal mood and affect.  Vascular Dorsalis pedis and posterior tibial pulses 2/4 bilat. Brisk capillary refill to all digits. Pedal hair present.  Neurologic Epicritic sensation grossly absent.  Dermatologic Dressing clean dry and intact  Orthopedic: MMT 5/5    Results for orders placed or performed during the hospital encounter of 10/26/21  Culture, blood (routine x 2)     Status: None   Collection Time: 10/26/21  8:28 PM   Specimen: BLOOD  Result Value Ref Range Status   Specimen Description BLOOD BLOOD LEFT ARM  Final   Special Requests   Final    BOTTLES DRAWN AEROBIC AND ANAEROBIC Blood Culture adequate volume   Culture   Final    NO GROWTH 5 DAYS Performed at [facility], [address] [place], [place] [address]    Report Status 10/31/2021 FINAL  Final  Culture, blood (routine x 2)     Status: None   Collection Time: 10/26/21  8:28 PM   Specimen: BLOOD  Result Value Ref Range Status   Specimen Description BLOOD BLOOD RIGHT ARM  Final   Special Requests   Final    BOTTLES DRAWN AEROBIC ONLY Blood Culture results may not be optimal due to an excessive volume of blood received in culture bottles   Culture   Final    NO GROWTH 5 DAYS Performed at [facility], [address] [place], [place] [address]    Report Status 10/31/2021 FINAL  Final  Surgical pcr screen     Status: None   Collection Time: 10/27/21  5:01 PM   Specimen: Nasal Mucosa; Nasal Swab  Result Value Ref Range Status   MRSA, PCR NEGATIVE NEGATIVE Final   Staphylococcus aureus  NEGATIVE NEGATIVE Final    Comment: (NOTE) The Xpert SA Assay (FDA approved for NASAL specimens in patients 37 years of age and older), is one component of a comprehensive surveillance program. It is not intended to diagnose infection nor to guide or monitor treatment. Performed at [facility], [address] [place], [place] [address]   Aerobic/Anaerobic Culture w Gram Stain (surgical/deep wound)     Status: None (Preliminary result)   Collection Time: 10/27/21  6:38 PM   Specimen: PATH Bone biopsy; Tissue  Result Value Ref Range Status   Specimen Description TISSUE  Final   Special Requests RIGHT 5TH METATARSAL ID3  Final   Gram Stain   Final    FEW WBC PRESENT,BOTH PMN AND MONONUCLEAR NO ORGANISMS SEEN    Culture   Final    NO GROWTH 4 DAYS NO ANAEROBES ISOLATED; CULTURE IN PROGRESS FOR 5 DAYS Performed at [facility], [address], [place], [place] [address]    Report Status PENDING  Incomplete  Aerobic/Anaerobic Culture w Gram Stain (surgical/deep wound)     Status: None (Preliminary result)   Collection Time: 10/27/21  6:39 PM   Specimen: PATH Bone biopsy; Tissue  Result Value Ref Range Status   Specimen Description  TISSUE  Final   Special Requests RIGHT CUBOID ID4  Final   Gram Stain NO WBC SEEN NO ORGANISMS SEEN   Final   Culture   Final    NO GROWTH 4 DAYS NO ANAEROBES ISOLATED; CULTURE IN PROGRESS FOR 5 DAYS Performed at [facility], [address], [place], [place] [address]    Report Status PENDING  Incomplete  Aerobic/Anaerobic Culture w Gram Stain (surgical/deep wound)     Status: None (Preliminary result)   Collection Time: 10/30/21 12:09 PM   Specimen: Wound  Result Value Ref Range Status   Specimen Description WOUND  Final   Special Requests NONE  Final   Gram Stain   Final    NO SQUAMOUS EPITHELIAL CELLS SEEN NO WBC SEEN NO ORGANISMS SEEN    Culture   Final    NO GROWTH < 24 HOURS Performed at [facility], [address], [place], [place] [address]    Report Status PENDING  Incomplete    Assessment & Plan:  Patient was evaluated and treated and all questions answered.  Bilateral foot ulcerations and diabetic right foot infection -No dressing changes needed. Post hospital follow up will be arranged -Bactrim x7 days at d/c -May WB to heels in post op shoes but advised he should try to rest and be off his feet as much as possible  Edwin Cap, DPM  Accessible via secure chat for questions or concerns.

## 2021-11-01 ENCOUNTER — Encounter (HOSPITAL_COMMUNITY): Payer: Self-pay | Admitting: Podiatry

## 2021-11-01 LAB — AEROBIC/ANAEROBIC CULTURE W GRAM STAIN (SURGICAL/DEEP WOUND)
Culture: NO GROWTH
Gram Stain: NONE SEEN

## 2021-11-02 ENCOUNTER — Encounter (HOSPITAL_BASED_OUTPATIENT_CLINIC_OR_DEPARTMENT_OTHER): Payer: BC Managed Care – PPO | Admitting: General Surgery

## 2021-11-02 LAB — AEROBIC/ANAEROBIC CULTURE W GRAM STAIN (SURGICAL/DEEP WOUND): Culture: NO GROWTH

## 2021-11-04 ENCOUNTER — Ambulatory Visit (INDEPENDENT_AMBULATORY_CARE_PROVIDER_SITE_OTHER): Payer: BC Managed Care – PPO | Admitting: Podiatry

## 2021-11-04 DIAGNOSIS — E11621 Type 2 diabetes mellitus with foot ulcer: Secondary | ICD-10-CM

## 2021-11-04 DIAGNOSIS — L97416 Non-pressure chronic ulcer of right heel and midfoot with bone involvement without evidence of necrosis: Secondary | ICD-10-CM

## 2021-11-04 DIAGNOSIS — E08621 Diabetes mellitus due to underlying condition with foot ulcer: Secondary | ICD-10-CM

## 2021-11-04 DIAGNOSIS — L97426 Non-pressure chronic ulcer of left heel and midfoot with bone involvement without evidence of necrosis: Secondary | ICD-10-CM

## 2021-11-04 LAB — AEROBIC/ANAEROBIC CULTURE W GRAM STAIN (SURGICAL/DEEP WOUND): Gram Stain: NONE SEEN

## 2021-11-08 NOTE — Progress Notes (Signed)
  Subjective:  Patient ID: [name], male    DOB: [birth year],  MRN: [id number]  Chief Complaint  Patient presents with   Routine Post Op      DOS 10/30/21 bilateral wound debridement and graft application     36 y.o. male returns for post-op check.  Overall doing well not having any pain  Review of Systems: Negative except as noted in the HPI. Denies N/V/F/Ch.   Objective:  There were no vitals filed for this visit. There is no height or weight on file to calculate BMI. Constitutional Well developed. Well nourished.  Vascular Foot warm and well perfused. Capillary refill normal to all digits.  Calf is soft and supple, no posterior calf or knee pain, negative Homans' sign  Neurologic Normal speech. Oriented to person, place, and time. Epicritic sensation to light touch grossly reduced bilaterally.  Dermatologic Grafts integrating well, secured with staples  Orthopedic: He has no tenderness to palpation noted about the surgical site.    Assessment:   1. 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 Diabetic ulcer of right midfoot associated with diabetes mellitus due to underlying condition, with bone involvement without evidence of necrosis [facility])    Plan:  Patient was evaluated and treated and all questions answered.  S/p foot surgery bilaterally -Progressing as expected post-operatively. -Grafts appear to be integrating well.  Dressing was changed and they will change this every 2 to 3 days to rehydrate with hydrogel or surgical lube or wound gel -Plan to remove in 2 weeks   Return in about 2 weeks (around 11/18/2021) for staple removal , wound care.

## 2021-11-10 ENCOUNTER — Encounter (INDEPENDENT_AMBULATORY_CARE_PROVIDER_SITE_OTHER): Payer: Self-pay

## 2021-11-15 ENCOUNTER — Ambulatory Visit (INDEPENDENT_AMBULATORY_CARE_PROVIDER_SITE_OTHER): Payer: BC Managed Care – PPO | Admitting: Ophthalmology

## 2021-11-15 ENCOUNTER — Encounter (INDEPENDENT_AMBULATORY_CARE_PROVIDER_SITE_OTHER): Payer: Self-pay | Admitting: Ophthalmology

## 2021-11-15 DIAGNOSIS — E103412 Type 1 diabetes mellitus with severe nonproliferative diabetic retinopathy with macular edema, left eye: Secondary | ICD-10-CM

## 2021-11-15 DIAGNOSIS — H25032 Anterior subcapsular polar age-related cataract, left eye: Secondary | ICD-10-CM

## 2021-11-15 DIAGNOSIS — E1036 Type 1 diabetes mellitus with diabetic cataract: Secondary | ICD-10-CM | POA: Diagnosis not present

## 2021-11-15 NOTE — Progress Notes (Signed)
11/15/2021     CHIEF COMPLAINT Patient presents for  Chief Complaint  Patient presents with   Diabetic Retinopathy with Macular Edema      HISTORY OF PRESENT ILLNESS: [name] is a 36 y.o. male who presents to the clinic today for:   HPI   Severe diabetic retinopathy ref by Dr. Seth Bake. Pt was seen 1 yr ago. Pt stated vision has worsened since last visit. Pt c/o blurry vision in both eyes. Pt denies floaters and FOL.  Last edited by Angeline Slim on 11/15/2021  3:32 PM.      Referring physician: [facility], [place] [address] [place],  [place] [address]  HISTORICAL INFORMATION:   Selected notes from the MEDICAL RECORD NUMBER    Lab Results  Component Value Date   HGBA1C 8.0 (H) 09/13/2021     CURRENT MEDICATIONS: No current outpatient medications on file. (Ophthalmic Drugs)   No current facility-administered medications for this visit. (Ophthalmic Drugs)   Current Outpatient Medications (Other)  Medication Sig   Continuous Blood Gluc Sensor (FREESTYLE LIBRE 14 DAY SENSOR) MISC Inject 1 patch into the skin every 14 (fourteen) days.   Continuous Blood Gluc Sensor MISC 1 each by Does not apply route as directed. Use as directed every 14 days. May dispense FreeStyle [employer] or similar.   Insulin Disposable Pump (OMNIPOD DASH 5 PACK PODS) MISC Inject into the skin.   insulin glargine (LANTUS SOLOSTAR) 100 UNIT/ML Solostar Pen Inject 40 Units into the skin daily before breakfast. (Patient taking differently: Inject 40-80 Units into the skin daily before breakfast. Sliding scale per patient)   No current facility-administered medications for this visit. (Other)      REVIEW OF SYSTEMS: ROS   Negative for: Constitutional, Gastrointestinal, Neurological, Skin, Genitourinary, Musculoskeletal, HENT, Endocrine, Cardiovascular, Eyes, Respiratory, Psychiatric, Allergic/Imm, Heme/Lymph Last edited by Angeline Slim on 11/15/2021  3:32 PM.        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;  Surgeon: Toni Arthurs, MD;  Location: [facility];  Service: Orthopedics;  Laterality: Left;   AMPUTATION Right 10/22/2020   Procedure: Right Fifth ray amputation;  Surgeon: Toni Arthurs, MD;  Location: [facility];  Service: Orthopedics;  Laterality: Right;   I & D EXTREMITY Bilateral 10/30/2021   Procedure: DEBRIDEMENT AND GRAFT APPLICATION;  Surgeon: Edwin Cap, DPM;  Location: [facility];  Service: Podiatry;  Laterality: Bilateral;   WOUND DEBRIDEMENT Bilateral 10/02/2019   Procedure: DEBRIDEMENT WOUNDS BOTH LOWER EXTREMITIES WITH BONE BIOPSIES;  Surgeon: Vivi Barrack, DPM;  Location: [facility];  Service: Podiatry;  Laterality: Bilateral;   WOUND DEBRIDEMENT Right 10/27/2021   Procedure: RIGHT FOOT DEBRIDEMENT WOUND AND BONE BIOPSY;  Surgeon: Edwin Cap, DPM;  Location: [facility];  Service: Podiatry;  Laterality: Right;    FAMILY HISTORY Family History  Problem Relation Age of Onset   Hypertension Other    Diabetes Other    Healthy Mother    Colon cancer Neg Hx    Stomach cancer Neg Hx    Pancreatic cancer Neg Hx  Esophageal cancer Neg Hx     SOCIAL HISTORY Social History   Tobacco Use   Smoking status: Never   Smokeless tobacco: Never  Vaping Use   Vaping Use: Never used  Substance Use Topics   Alcohol use: No   Drug use: No         OPHTHALMIC EXAM:  Base Eye Exam     Visual Acuity (ETDRS)       Right Left   Dist [place] 20/60 20/80 -1   Dist ph [place] 20/40 -1 20/60         Tonometry (Tonopen, 3:38 PM)        Right Left   Pressure 16 17         Pupils       Pupils Dark Light Shape React APD   Right PERRL 4 3 Round Brisk None   Left PERRL 4 3 Round Brisk None         Visual Fields       Left Right    Full Full         Extraocular Movement       Right Left    Full, Ortho Full, Ortho         Neuro/Psych     Oriented x3: Yes   Mood/Affect: Normal         Dilation     Both eyes: 1.0% Mydriacyl, 2.5% Phenylephrine @ 3:38 PM           Slit Lamp and Fundus Exam     External Exam       Right Left   External Normal Normal         Slit Lamp Exam       Right Left   Lids/Lashes Normal Normal   Conjunctiva/Sclera White and quiet White and quiet   Cornea Clear Clear   Anterior Chamber Deep and quiet Deep and quiet   Iris Round and reactive Round and reactive   Lens 2+ Nuclear sclerosis 2+ Nuclear sclerosis, sub anterior capsule opacity, 4+ Anterior cortical changes, individual axis   Anterior Vitreous Normal Normal         Fundus Exam       Right Left   Posterior Vitreous Normal Normal   Disc Normal Normal   C/D Ratio 0.5 0.5   Macula no macular thickening, Microaneurysms Exudates, Microaneurysms, Macular thickening minor   Vessels NPDR-Severe Severe nonproliferative diabetic retinopathy   Periphery Normal Normal            IMAGING AND PROCEDURES  Imaging and Procedures for 11/15/21  OCT, Retina - OU - Both Eyes       Right Eye Quality was good. Scan locations included subfoveal. Central Foveal Thickness: 250. Progression has worsened. Findings include normal foveal contour.   Left Eye Quality was good. Scan locations included subfoveal. Central Foveal Thickness: 275. Progression has improved. Findings include abnormal foveal contour, cystoid macular edema.   Notes New active CSME OS, recurrence of CSME inferotemporal to FAZ.   OD with very small focal changes temporal to the fovea with good visual acuity can observe.      Color  Fundus Photography Optos - OU - Both Eyes       Right Eye Progression has no prior data. Disc findings include normal observations. Vessels : IRMA. Periphery : hemorrhage.   Left Eye Progression has no prior data. Disc findings include normal observations. Vessels : IRMA. Periphery : hemorrhage.   Notes Severe nonproliferative  diabetic retinopathy with clinically significant macular edema.  OS.  We will need to commence therapy to control CSME prior to having cataract surgery left eye              ASSESSMENT/PLAN:  Severe nonproliferative diabetic retinopathy of left eye, with macular edema, associated with type 1 diabetes mellitus (HCC) CSME center threatening, will need control prior to having cataract surgery in the left eye.  Will need intravitreal Avastin OS.  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  Certainly frosting opacity is significant accounts for the acuity     ICD-10-CM   1. Severe nonproliferative diabetic retinopathy of left eye, with macular edema, associated with type 1 diabetes mellitus (HCC)  E10.3412 OCT, Retina - OU - Both Eyes    Color Fundus Photography Optos - OU - Both Eyes    2. 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 Anterior subcapsular age-related cataract, left eye  H25.032       1.  OS, with center threatening CSME from severe NPDR.  In the midst of cataract opacity which is going to require cataract surgery in the left eye.  2.  We will commence therapy with intravitreal Avastin OS soon  3.  Dr. Adella Hare of [facility] to arrange cataract surgery with intraocular lens implantation left eye with a surgeon of his choice  Ophthalmic Meds Ordered this visit:  No orders of the defined types were placed  in this encounter.      Return in about 3 days (around 11/18/2021) for NO DILATE, AVASTIN OCT, OS.  There are no Patient Instructions on file for this visit.   Explained the diagnoses, plan, and follow up with the patient and they expressed understanding.  Patient expressed understanding of the importance of proper follow up care.   Alford Highland Florencio Hollibaugh M.D. Diseases & Surgery of the Retina and Vitreous [facility] 11/15/21     Abbreviations: M myopia (nearsighted); A astigmatism; H hyperopia (farsighted); P presbyopia; Mrx spectacle prescription;  CTL contact lenses; OD right eye; OS left eye; OU both eyes  XT exotropia; ET esotropia; PEK punctate epithelial keratitis; PEE punctate epithelial erosions; DES dry eye syndrome; MGD meibomian gland dysfunction; ATs artificial tears; PFAT's preservative free artificial tears; NSC nuclear sclerotic cataract; PSC posterior subcapsular cataract; ERM epi-retinal membrane; PVD posterior vitreous detachment; RD retinal detachment; DM diabetes mellitus; DR diabetic retinopathy; NPDR non-proliferative diabetic retinopathy; PDR proliferative diabetic retinopathy; CSME clinically significant macular edema; DME diabetic macular edema; dbh dot blot hemorrhages; CWS cotton wool spot; POAG primary open angle glaucoma; C/D cup-to-disc ratio; HVF humphrey visual field; GVF goldmann visual field; OCT optical coherence tomography; IOP intraocular pressure; BRVO Branch retinal vein occlusion; CRVO central retinal vein occlusion; CRAO central retinal artery occlusion; BRAO branch retinal artery occlusion; RT retinal tear; SB scleral buckle; PPV pars plana vitrectomy; VH Vitreous hemorrhage; PRP panretinal laser photocoagulation; IVK intravitreal kenalog; VMT vitreomacular traction; MH Macular hole;  NVD neovascularization of the disc; NVE neovascularization elsewhere; AREDS age related eye disease study; ARMD age related macular degeneration; POAG primary open  angle glaucoma; EBMD epithelial/anterior basement membrane dystrophy; ACIOL anterior chamber intraocular lens; IOL intraocular lens; PCIOL posterior chamber intraocular lens; Phaco/IOL phacoemulsification with intraocular lens placement; PRK photorefractive keratectomy; LASIK laser assisted in situ  keratomileusis; HTN hypertension; DM diabetes mellitus; COPD chronic obstructive pulmonary disease

## 2021-11-15 NOTE — Assessment & Plan Note (Signed)
Likely diabetic cataract related.  Certainly frosting opacity is significant accounts for the acuity

## 2021-11-15 NOTE — Assessment & Plan Note (Signed)
Visually significant OS, see comments regarding anterior subcapsular cortical changes and NSC changes OS

## 2021-11-15 NOTE — Assessment & Plan Note (Addendum)
CSME center threatening, will need control prior to having cataract surgery in the left eye.  Will need intravitreal Avastin OS.

## 2021-11-18 ENCOUNTER — Ambulatory Visit (INDEPENDENT_AMBULATORY_CARE_PROVIDER_SITE_OTHER): Payer: BC Managed Care – PPO | Admitting: Podiatry

## 2021-11-18 ENCOUNTER — Encounter (INDEPENDENT_AMBULATORY_CARE_PROVIDER_SITE_OTHER): Payer: BC Managed Care – PPO | Admitting: Ophthalmology

## 2021-11-18 ENCOUNTER — Encounter: Payer: Self-pay | Admitting: Podiatry

## 2021-11-18 ENCOUNTER — Telehealth: Payer: Self-pay | Admitting: Podiatry

## 2021-11-18 DIAGNOSIS — L97416 Non-pressure chronic ulcer of right heel and midfoot with bone involvement without evidence of necrosis: Secondary | ICD-10-CM

## 2021-11-18 DIAGNOSIS — L97426 Non-pressure chronic ulcer of left heel and midfoot with bone involvement without evidence of necrosis: Secondary | ICD-10-CM

## 2021-11-18 DIAGNOSIS — E11621 Type 2 diabetes mellitus with foot ulcer: Secondary | ICD-10-CM

## 2021-11-18 DIAGNOSIS — E08621 Diabetes mellitus due to underlying condition with foot ulcer: Secondary | ICD-10-CM

## 2021-11-22 NOTE — Progress Notes (Signed)
  Subjective:  Patient ID: [name], male    DOB: 1986-03-25,  MRN: [id number]  Chief Complaint  Patient presents with   Diabetic Ulcer     Staples came out on left foot.     36 y.o. male returns for post-op check.  Noted that some of the staples came out  Review of Systems: Negative except as noted in the HPI. Denies N/V/F/Ch.   Objective:  There were no vitals filed for this visit. There is no height or weight on file to calculate BMI. Constitutional Well developed. Well nourished.  Vascular Foot warm and well perfused. Capillary refill normal to all digits.  Calf is soft and supple, no posterior calf or knee pain, negative Homans' sign  Neurologic Normal speech. Oriented to person, place, and time. Epicritic sensation to light touch grossly reduced bilaterally.  Dermatologic Dorsal foot graphs have integrated well staples are intact, plantar grafts have some loosening probably about 50 to 70% take of graft  Orthopedic: He has no tenderness to palpation noted about the surgical site.    Assessment:   1. 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 Diabetic ulcer of right midfoot associated with diabetes mellitus due to underlying condition, with bone involvement without evidence of necrosis [facility])     Plan:  Patient was evaluated and treated and all questions answered.  S/p foot surgery bilaterally -Staples removed.  Continue dressing changes.  He will return next week for removal of the remainder of the staples.  Plan to follow-up with the wound care center thereafter   No follow-ups on file.

## 2021-11-23 ENCOUNTER — Encounter: Payer: BC Managed Care – PPO | Admitting: Podiatry

## 2021-11-23 ENCOUNTER — Ambulatory Visit (INDEPENDENT_AMBULATORY_CARE_PROVIDER_SITE_OTHER): Payer: BC Managed Care – PPO | Admitting: Podiatry

## 2021-11-23 ENCOUNTER — Telehealth: Payer: Self-pay | Admitting: Podiatry

## 2021-11-23 ENCOUNTER — Encounter: Payer: Self-pay | Admitting: Podiatry

## 2021-11-23 DIAGNOSIS — L97426 Non-pressure chronic ulcer of left heel and midfoot with bone involvement without evidence of necrosis: Secondary | ICD-10-CM

## 2021-11-23 DIAGNOSIS — E11621 Type 2 diabetes mellitus with foot ulcer: Secondary | ICD-10-CM

## 2021-11-23 NOTE — Progress Notes (Signed)
Patient seen today for removal of staples and dressing change.   Patient denies any fever, nausea, vomiting, chills or chest pain at this time.   Nurse removed staples from both ulcers to arch of bilateral feet. Patient tolerated procedure well. Nurse cleansed ulcers with saline. Applied wound gel to the ulcers, covered with adhesive dressing, ABD pad, gauze wrap, secured with coban followed by stockinet. Patient has an appointment with the wound center tomorrow.  ---  Patient was seen on the nurse schedule and I did not personally evaluate this patient.

## 2021-11-23 NOTE — Telephone Encounter (Signed)
Error

## 2021-11-24 ENCOUNTER — Encounter (HOSPITAL_BASED_OUTPATIENT_CLINIC_OR_DEPARTMENT_OTHER): Payer: BC Managed Care – PPO | Attending: General Surgery | Admitting: General Surgery

## 2021-11-24 DIAGNOSIS — L97812 Non-pressure chronic ulcer of other part of right lower leg with fat layer exposed: Secondary | ICD-10-CM | POA: Insufficient documentation

## 2021-11-24 DIAGNOSIS — E11621 Type 2 diabetes mellitus with foot ulcer: Secondary | ICD-10-CM | POA: Insufficient documentation

## 2021-11-24 DIAGNOSIS — L97422 Non-pressure chronic ulcer of left heel and midfoot with fat layer exposed: Secondary | ICD-10-CM | POA: Diagnosis not present

## 2021-11-24 DIAGNOSIS — L97512 Non-pressure chronic ulcer of other part of right foot with fat layer exposed: Secondary | ICD-10-CM | POA: Diagnosis not present

## 2021-11-24 DIAGNOSIS — L97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: Secondary | ICD-10-CM | POA: Diagnosis not present

## 2021-11-24 DIAGNOSIS — L97322 Non-pressure chronic ulcer of left ankle with fat layer exposed: Secondary | ICD-10-CM | POA: Diagnosis not present

## 2021-11-24 DIAGNOSIS — L97522 Non-pressure chronic ulcer of other part of left foot with fat layer exposed: Secondary | ICD-10-CM | POA: Diagnosis not present

## 2021-11-24 DIAGNOSIS — L97528 Non-pressure chronic ulcer of other part of left foot with other specified severity: Secondary | ICD-10-CM | POA: Diagnosis not present

## 2021-11-29 NOTE — Progress Notes (Signed)
[name], Max ([id number]) Visit Report for 11/24/2021 Arrival Information Details Patient Name: Date of Service: [name] 11/24/2021 12:45 PM Medical Record Number: [id number] Patient Account Number: [id number] Date of Birth/Sex: Treating RN: Nov 12, 1985 (36 y.o. [name] Primary Care [name]: [name] Other Clinician: Referring Swan [place]: Treating [name]/Extender: [name] in Treatment: 33 Visit Information History Since Last Visit All ordered tests and consults were completed: Yes Patient Arrived: [name] Added or deleted any medications: No Arrival Time: 12:45 Any new allergies or adverse reactions: No Transfer Assistance: None Had Max fall or experienced change in No Patient Identification Verified: Yes activities of daily living that may affect Secondary Verification Process Completed: Yes risk of falls: Patient Requires Transmission-Based Precautions: No Signs or symptoms of abuse/neglect since last visito No Patient Has Alerts: No Hospitalized since last visit: No Implantable device outside of the clinic excluding No cellular tissue based products placed in the center since last visit: Has Dressing in Place as Prescribed: Yes Pain Present Now: No Electronic Signature(s) Signed: 11/26/2021 7:35:53 AM By: Max East RN Entered By: [name] on 11/24/2021 12:51:41 -------------------------------------------------------------------------------- Encounter Discharge Information Details Patient Name: Date of Service: [name], [name] 11/24/2021 12:45 PM Medical Record Number: [id number] Patient Account Number: [id number] Date of Birth/Sex: Treating RN: December 05, 1985 (36 y.o. [name] Primary Care [name]: [name] Other Clinician: Referring [name]: Treating [name]/Extender: [name] in Treatment: [phone] Encounter Discharge Information Items Post Procedure Vitals Discharge  Condition: Stable Temperature (F): 98.0 Ambulatory Status: Walker Pulse (bpm): 102 Discharge Destination: Home Respiratory Rate (breaths/min): 18 Transportation: Private Auto Blood Pressure (mmHg): 140/88 Accompanied By: wife Schedule Follow-up Appointment: Yes Clinical Summary of Care: Electronic Signature(s) Signed: 11/26/2021 7:35:53 AM By: Max East RN Entered By: [name] on 11/24/2021 14:01:29 -------------------------------------------------------------------------------- Lower Extremity Assessment Details Patient Name: Date of Service: [name] 11/24/2021 12:45 PM Medical Record Number: [id number] Patient Account Number: [id number] Date of Birth/Sex: Treating RN: [birth year] (36 y.o. [name] Primary Care [name]: [name] Other Clinician: Referring [name]: Treating [name]/Extender: [name] in Treatment: 108 Edema Assessment Assessed: [name]: No] [Right: No] Edema: [Left: Yes] [Right: Yes] Calf Left: Right: Point of Measurement: 48 cm From Medial Instep 50 cm 50 cm Ankle Left: Right: Point of Measurement: 11 cm From Medial Instep 29 cm 30 cm Vascular Assessment Pulses: Dorsalis Pedis Palpable: [Left:Yes] [Right:Yes] Electronic Signature(s) Signed: 11/26/2021 7:35:53 AM By: Max East RN Entered By: [name] on 11/24/2021 12:59:41 -------------------------------------------------------------------------------- Multi Wound Chart Details Patient Name: Date of Service: [name], [name] 11/24/2021 12:45 PM Medical Record Number: [id number] Patient Account Number: [id number] Date of Birth/Sex: Treating RN: June 25, 1985 (36 y.o. [name] Primary Care Lejla Moeser: [name] Other Clinician: Referring Ranetta [name]: Treating [name]/Extender: [name] in Treatment: 108 Vital Signs Height(in): 77 Capillary Blood Glucose(mg/dl): 124 Weight(lbs):  280 Pulse(bpm): 102 Body Mass Index(BMI): 33.2 Blood Pressure(mmHg): 140/88 Temperature(F): 98.0 Respiratory Rate(breaths/min): 18 Photos: Right, Anterior Foot Left, Dorsal Foot Left, Lateral, Plantar Foot Wound Location: Gradually Appeared Gradually Appeared Trauma Wounding Event: Diabetic Wound/Ulcer of the Lower Diabetic Wound/Ulcer of the Lower Diabetic Wound/Ulcer of the Lower Primary Etiology: Extremity Extremity Extremity Type II Diabetes Type II Diabetes Type II Diabetes Comorbid History: 08/20/2021 09/29/2021 10/02/2019 Date Acquired: 13 8 108 Weeks of Treatment: Open Open Open Wound Status: No No No Wound Recurrence: 2x2x0.1 0.5x0.5x0.1 3.6x4.5x0.5 Measurements L x W x [name] 3.142 0.196  12.723 Max (cm) : rea 0.314 0.02 6.362 Volume (cm) : -10035.50% 79.20% -671.60% % Reduction in Max rea: -10366.70% 78.70% -3755.80% % Reduction in Volume: 4 Starting Position 1 (o'clock): 9 Ending Position 1 (o'clock): 0.6 Maximum Distance 1 (cm): No No Yes Undermining: Grade 2 Grade 2 Grade 2 Classification: Medium Medium Medium Exudate Max mount: Serosanguineous Serosanguineous Serosanguineous Exudate Type: red, brown red, brown red, brown Exudate Color: Distinct, outline attached Distinct, outline attached Well defined, not attached Wound Margin: Medium (34-66%) Medium (34-66%) Small (1-33%) Granulation Max mount: Red, Pink Red Pink Granulation Quality: Medium (34-66%) Medium (34-66%) Medium (34-66%) Necrotic Max mount: Fat Layer (Subcutaneous Tissue): Yes Fat Layer (Subcutaneous Tissue): Yes Fat Layer (Subcutaneous Tissue): Yes Exposed Structures: Fascia: No Fascia: No Fascia: No Tendon: No Tendon: No Tendon: No Muscle: No Muscle: No Muscle: No Joint: No Joint: No Joint: No Bone: No Bone: No Bone: No Small (1-33%) Small (1-33%) Small (1-33%) Epithelialization: Debridement - Selective/Open Wound Debridement - Selective/Open Wound Debridement -  Selective/Open Wound Debridement: Pre-procedure Verification/Time Out 13:18 13:18 13:18 Taken: Art therapist, Callus, Slough Necrotic/Eschar, Psychologist, prison and probation services, [employer], [employer] Tissue Debrided: Skin/Epidermis Non-Viable Tissue Skin/Epidermis Level: 1 0.25 16.2 Debridement Max (sq cm): rea Curette Curette Curette Instrument: Minimum Minimum Minimum Bleeding: Pressure Pressure Pressure Hemostasis Max chieved: 0 0 0 Procedural Pain: 0 0 0 Post Procedural Pain: Procedure was tolerated well Procedure was tolerated well Procedure was tolerated well Debridement Treatment Response: 2x2x0.1 0.5x0.5x0.1 3.6x4.5x0.5 Post Debridement Measurements L x W x [name] 0.314 0.02 6.362 Post Debridement Volume: (cm) Debridement Debridement Debridement Procedures Performed: Wound Number: 8 N/Max N/Max Photos: N/Max N/Max Right, Lateral Foot N/Max N/Max Wound Location: Blister N/Max N/Max Wounding Event: Diabetic Wound/Ulcer of the Lower N/Max N/Max Primary Etiology: Extremity Type II Diabetes N/Max N/Max Comorbid History: 04/18/2021 N/Max N/Max Date Acquired: 62 N/Max N/Max Weeks of Treatment: Open N/Max N/Max Wound Status: No N/Max N/Max Wound Recurrence: 4x2.3x0.1 N/Max N/Max Measurements L x W x [name] 7.226 N/Max N/Max Max (cm) : rea 0.723 N/Max N/Max Volume (cm) : -1213.80% N/Max N/Max % Reduction in Max rea: -557.30% N/Max N/Max % Reduction in Volume: No N/Max N/Max Undermining: Grade 2 N/Max N/Max Classification: Large N/Max N/Max Exudate Max mount: Serosanguineous N/Max N/Max Exudate Type: red, brown N/Max N/Max Exudate Color: Well defined, not attached N/Max N/Max Wound Margin: Medium (34-66%) N/Max N/Max Granulation Max mount: Red, Pink N/Max N/Max Granulation Quality: Medium (34-66%) N/Max N/Max Necrotic Max mount: Fat Layer (Subcutaneous Tissue): Yes N/Max N/Max Exposed Structures: Fascia: No Tendon: No Muscle: No Joint: No Bone: No Small (1-33%) N/Max N/Max Epithelialization: Debridement - Selective/Open Wound N/Max N/Max Debridement: Pre-procedure  Verification/Time Out 13:18 N/Max N/Max Taken: Necrotic/Eschar, Callus, Slough N/Max N/Max Tissue Debrided: Skin/Epidermis N/Max N/Max Level: 9.2 N/Max N/Max Debridement Max (sq cm): rea Curette, Forceps, Scissors N/Max N/Max Instrument: Minimum N/Max N/Max Bleeding: Pressure N/Max N/Max Hemostasis Max chieved: 0 N/Max N/Max Procedural Pain: 0 N/Max N/Max Post Procedural Pain: Procedure was tolerated well N/Max N/Max Debridement Treatment Response: 4x2.3x0.1 N/Max N/Max Post Debridement Measurements L x W x [name] 0.723 N/Max N/Max Post Debridement Volume: (cm) Debridement N/Max N/Max Procedures Performed: Treatment Notes Electronic Signature(s) Signed: 11/24/2021 1:31:22 PM By: [name] MD FACS Signed: 11/29/2021 5:02:03 PM By: Adline Peals Entered By: [name] on 11/24/2021 13:31:22 -------------------------------------------------------------------------------- Multi-Disciplinary Care Plan Details Patient Name: Date of Service: [name], [name] 11/24/2021 12:45 PM Medical Record Number: [id number] Patient Account Number: [id number] Date of Birth/Sex: Treating RN: Apr 25, 1985 (36 y.o. M) Zochol,  Roselyn Reef Primary Care Fredna Stricker: [name] Other Clinician: Referring Jamespaul Secrist: Treating Taegen Delker/Extender: [name] in Treatment: [place] reviewed with physician Active Inactive Nutrition Nursing Diagnoses: Imbalanced nutrition Potential for alteratiion in Nutrition/Potential for imbalanced nutrition Goals: Patient/caregiver agrees to and verbalizes understanding of need to use nutritional supplements and/or vitamins as prescribed Date Initiated: 10/24/2019 Date Inactivated: 04/06/2020 Target Resolution Date: 04/03/2020 Goal Status: Met Patient/caregiver will maintain therapeutic glucose control Date Initiated: 10/24/2019 Target Resolution Date: 12/22/2021 Goal Status: Active Interventions: Assess HgA1c results as ordered upon admission and as  needed Assess patient nutrition upon admission and as needed per policy Provide education on elevated blood sugars and impact on wound healing Provide education on nutrition Treatment Activities: Education provided on Nutrition : 10/25/2021 Notes: 11/17/20: Glucose control ongoing issue, target date extended. 01/26/21: Glucose management continues. Wound/Skin Impairment Nursing Diagnoses: Impaired tissue integrity Knowledge deficit related to ulceration/compromised skin integrity Goals: Patient/caregiver will verbalize understanding of skin care regimen Date Initiated: 10/24/2019 Target Resolution Date: 12/22/2021 Goal Status: Active Ulcer/skin breakdown will have Max volume reduction of 30% by week 4 Date Initiated: 10/24/2019 Date Inactivated: 01/16/2020 Target Resolution Date: 01/10/2020 Unmet Reason: no change in Goal Status: Unmet measurements. Interventions: Assess patient/caregiver ability to obtain necessary supplies Assess patient/caregiver ability to perform ulcer/skin care regimen upon admission and as needed Assess ulceration(s) every visit Provide education on ulcer and skin care Notes: 11/17/20: Wound care regimen continues Electronic Signature(s) Signed: 11/26/2021 7:35:53 AM By: Max East RN Entered By: [name] on 11/24/2021 13:12:39 -------------------------------------------------------------------------------- Pain Assessment Details Patient Name: Date of Service: [name] 11/24/2021 12:45 PM Medical Record Number: [id number] Patient Account Number: [id number] Date of Birth/Sex: Treating RN: Nov 05, 1985 (36 y.o. [name] Primary Care Akina Maish: [name] Other Clinician: Referring Walta [place]: Treating Kharma Sampsel/Extender: [name] in Treatment: [phone] Active Problems Location of Pain Severity and Description of Pain Patient Has Paino No Site Locations Pain Management and Medication Current Pain  Management: Electronic Signature(s) Signed: 11/26/2021 7:35:53 AM By: Max East RN Entered By: [name] on 11/24/2021 12:52:26 -------------------------------------------------------------------------------- Patient/Caregiver Education Details Patient Name: Date of Service: [name] 8/23/2023andnbsp12:45 PM Medical Record Number: [id number] Patient Account Number: [id number] Date of Birth/Gender: Treating RN: 05/07/1985 (36 y.o. [name] Primary Care Physician: [name] Other Clinician: Referring Physician: Treating Physician/Extender: [name] in Treatment: 108 Education Assessment Education Provided To: Patient Education Topics Provided Elevated Blood Sugar/ Impact on Healing: Methods: Explain/Verbal Responses: Reinforcements needed, State content correctly Nutrition: Methods: Explain/Verbal Responses: Reinforcements needed, State content correctly Wound/Skin Impairment: Methods: Explain/Verbal Responses: Reinforcements needed, State content correctly Electronic Signature(s) Signed: 11/26/2021 7:35:53 AM By: Max East RN Entered By: [name] on 11/24/2021 13:13:08 -------------------------------------------------------------------------------- Wound Assessment Details Patient Name: Date of Service: [name] 11/24/2021 12:45 PM Medical Record Number: [id number] Patient Account Number: [id number] Date of Birth/Sex: Treating RN: March 29, 1986 (36 y.o. [name] Primary Care Charrise Lardner: [name] Other Clinician: Referring Christoper Bushey: Treating Nyjah Denio/Extender: [name] in Treatment: 108 Wound Status Wound Number: 11 Primary Etiology: Diabetic Wound/Ulcer of the Lower Extremity Wound Location: Right, Anterior Foot Wound Status: Open Wounding Event: Gradually Appeared Comorbid History: Type II Diabetes Date Acquired: 08/20/2021 Weeks Of Treatment:  13 Clustered Wound: No Photos Wound Measurements Length: (cm) 2 Width: (cm) 2 Depth: (cm) 0.1 Area: (cm) 3.142 Volume: (cm) 0.314 % Reduction in Area: -10035.5% % Reduction in Volume: -10366.7% Epithelialization: Small (1-33%)  Tunneling: No Undermining: No Wound Description Classification: Grade 2 Wound Margin: Distinct, outline attached Exudate Amount: Medium Exudate Type: Serosanguineous Exudate Color: red, brown Foul Odor After Cleansing: No Slough/Fibrino Yes Wound Bed Granulation Amount: Medium (34-66%) Exposed Structure Granulation Quality: Red, Pink Fascia Exposed: No Necrotic Amount: Medium (34-66%) Fat Layer (Subcutaneous Tissue) Exposed: Yes Tendon Exposed: No Muscle Exposed: No Joint Exposed: No Bone Exposed: No Treatment Notes Wound #11 (Foot) Wound Laterality: Right, Anterior Cleanser Soap and Water Discharge Instruction: May shower and wash wound with dial antibacterial soap and water prior to dressing change. Peri-Wound Care Sween Lotion (Moisturizing lotion) Discharge Instruction: Apply moisturizing lotion as directed Topical keystone antibiotic compound Primary Dressing Promogran Prisma Matrix, 4.34 (sq in) (silver collagen) Discharge Instruction: Moisten collagen with saline or hydrogel Secondary Dressing ABD Pad, 8x10 Discharge Instruction: Apply over primary dressing as directed. Optifoam Non-Adhesive Dressing, 4x4 in Discharge Instruction: Apply over primary dressing as directed. NonWoven Sponge, 4x4 (in/in) Secured With 100M Medipore H Soft Cloth Surgical T ape, 4 x 10 (in/yd) Discharge Instruction: Secure with tape as directed. Compression Wrap Kerlix Roll 4.5x3.1 (in/yd) Discharge Instruction: Apply Kerlix and Coban compression as directed. 100M ACE Elastic Bandage With VELCRO Brand Closure, 4 (in) Compression Stockings Add-Ons Electronic Signature(s) Signed: 11/26/2021 7:35:53 AM By: Max East RN Entered By: [name] on  11/24/2021 13:30:07 -------------------------------------------------------------------------------- Wound Assessment Details Patient Name: Date of Service: [name] 11/24/2021 12:45 PM Medical Record Number: [id number] Patient Account Number: [id number] Date of Birth/Sex: Treating RN: 1985/04/11 (36 y.o. [name] Primary Care Airica Schwartzkopf: [name] Other Clinician: Referring Nicki Gracy: Treating Quenisha Lovins/Extender: [name] in Treatment: 108 Wound Status Wound Number: 12 Primary Etiology: Diabetic Wound/Ulcer of the Lower Extremity Wound Location: Left, Dorsal Foot Wound Status: Open Wounding Event: Gradually Appeared Comorbid History: Type II Diabetes Date Acquired: 09/29/2021 Weeks Of Treatment: 8 Clustered Wound: No Photos Wound Measurements Length: (cm) 0.5 Width: (cm) 0.5 Depth: (cm) 0.1 Area: (cm) 0.196 Volume: (cm) 0.02 % Reduction in Area: 79.2% % Reduction in Volume: 78.7% Epithelialization: Small (1-33%) Tunneling: No Undermining: No Wound Description Classification: Grade 2 Wound Margin: Distinct, outline attached Exudate Amount: Medium Exudate Type: Serosanguineous Exudate Color: red, brown Foul Odor After Cleansing: No Slough/Fibrino Yes Wound Bed Granulation Amount: Medium (34-66%) Exposed Structure Granulation Quality: Red Fascia Exposed: No Necrotic Amount: Medium (34-66%) Fat Layer (Subcutaneous Tissue) Exposed: Yes Necrotic Quality: Adherent Slough Tendon Exposed: No Muscle Exposed: No Joint Exposed: No Bone Exposed: No Treatment Notes Wound #12 (Foot) Wound Laterality: Dorsal, Left Cleanser Soap and Water Discharge Instruction: May shower and wash wound with dial antibacterial soap and water prior to dressing change. Peri-Wound Care Sween Lotion (Moisturizing lotion) Discharge Instruction: Apply moisturizing lotion as directed Topical keystone antibiotic compound Primary  Dressing Promogran Prisma Matrix, 4.34 (sq in) (silver collagen) Discharge Instruction: Moisten collagen with saline or hydrogel Secondary Dressing ABD Pad, 8x10 Discharge Instruction: Apply over primary dressing as directed. Optifoam Non-Adhesive Dressing, 4x4 in Discharge Instruction: Apply over primary dressing as directed. NonWoven Sponge, 4x4 (in/in) Secured With 100M Medipore H Soft Cloth Surgical T ape, 4 x 10 (in/yd) Discharge Instruction: Secure with tape as directed. Compression Wrap Kerlix Roll 4.5x3.1 (in/yd) Discharge Instruction: Apply Kerlix and Coban compression as directed. 100M ACE Elastic Bandage With VELCRO Brand Closure, 4 (in) Compression Stockings Add-Ons Electronic Signature(s) Signed: 11/26/2021 7:35:53 AM By: Max East RN Entered By: [name] on 11/24/2021 13:10:15 -------------------------------------------------------------------------------- Wound Assessment Details Patient Name: Date of Service: [name] 11/24/2021 12:45 PM Medical Record Number: [id number] Patient Account Number: [id number] Date of Birth/Sex: Treating RN: Oct 01, 1985 (36 y.o. [name] Primary Care Asar Evilsizer: [name] Other Clinician: Referring Jamyah Folk: Treating Tiasia Weberg/Extender: [name] in Treatment: 108 Wound Status Wound Number: 3 Primary Etiology: Diabetic Wound/Ulcer of the Lower Extremity Wound Location: Left, Lateral, Plantar Foot Wound Status: Open Wounding Event: Trauma Comorbid History: Type II Diabetes Date Acquired: 10/02/2019 Weeks Of Treatment: 108 Clustered Wound: No Photos Wound Measurements Length: (cm) 3.6 Width: (cm) 4.5 Depth: (cm) 0.5 Area: (cm) 12.723 Volume: (cm) 6.362 % Reduction in Area: -671.6% % Reduction in Volume: -3755.8% Epithelialization: Small (1-33%) Tunneling: No Undermining: Yes Starting Position (o'clock): 4 Ending Position (o'clock): 9 Maximum Distance: (cm) 0.6 Wound  Description Classification: Grade 2 Wound Margin: Well defined, not attached Exudate Amount: Medium Exudate Type: Serosanguineous Exudate Color: red, brown Foul Odor After Cleansing: No Slough/Fibrino Yes Wound Bed Granulation Amount: Small (1-33%) Exposed Structure Granulation Quality: Pink Fascia Exposed: No Necrotic Amount: Medium (34-66%) Fat Layer (Subcutaneous Tissue) Exposed: Yes Necrotic Quality: Adherent Slough Tendon Exposed: No Muscle Exposed: No Joint Exposed: No Bone Exposed: No Treatment Notes Wound #3 (Foot) Wound Laterality: Plantar, Left, Lateral Cleanser Soap and Water Discharge Instruction: May shower and wash wound with dial antibacterial soap and water prior to dressing change. Peri-Wound Care Sween Lotion (Moisturizing lotion) Discharge Instruction: Apply moisturizing lotion as directed Topical keystone antibiotic compound Primary Dressing Promogran Prisma Matrix, 4.34 (sq in) (silver collagen) Discharge Instruction: Moisten collagen with saline or hydrogel Secondary Dressing ABD Pad, 8x10 Discharge Instruction: Apply over primary dressing as directed. Optifoam Non-Adhesive Dressing, 4x4 in Discharge Instruction: Apply over primary dressing as directed. NonWoven Sponge, 4x4 (in/in) Secured With 651M Medipore H Soft Cloth Surgical T ape, 4 x 10 (in/yd) Discharge Instruction: Secure with tape as directed. Compression Wrap Kerlix Roll 4.5x3.1 (in/yd) Discharge Instruction: Apply Kerlix and Coban compression as directed. 651M ACE Elastic Bandage With VELCRO Brand Closure, 4 (in) Compression Stockings Add-Ons Electronic Signature(s) Signed: 11/26/2021 7:35:53 AM By: Max East RN Entered By: [name] on 11/24/2021 13:11:41 -------------------------------------------------------------------------------- Wound Assessment Details Patient Name: Date of Service: [name] 11/24/2021 12:45 PM Medical Record Number: [id number] Patient  Account Number: [id number] Date of Birth/Sex: Treating RN: 1985/09/03 (36 y.o. [name] Primary Care Schneider Warchol: [name] Other Clinician: Referring Vasili Fok: Treating Consuelo Thayne/Extender: [name] in Treatment: 108 Wound Status Wound Number: 8 Primary Etiology: Diabetic Wound/Ulcer of the Lower Extremity Wound Location: Right, Lateral Foot Wound Status: Open Wounding Event: Blister Comorbid History: Type II Diabetes Date Acquired: 04/18/2021 Weeks Of Treatment: 31 Clustered Wound: No Photos Wound Measurements Length: (cm) 4 Width: (cm) 2.3 Depth: (cm) 0.1 Area: (cm) 7.226 Volume: (cm) 0.723 % Reduction in Area: -1213.8% % Reduction in Volume: -557.3% Epithelialization: Small (1-33%) Tunneling: No Undermining: No Wound Description Classification: Grade 2 Wound Margin: Well defined, not attached Exudate Amount: Large Exudate Type: Serosanguineous Exudate Color: red, brown Foul Odor After Cleansing: No Slough/Fibrino Yes Wound Bed Granulation Amount: Medium (34-66%) Exposed Structure Granulation Quality: Red, Pink Fascia Exposed: No Necrotic Amount: Medium (34-66%) Fat Layer (Subcutaneous Tissue) Exposed: Yes Necrotic Quality: Adherent Slough Tendon Exposed: No Muscle Exposed: No Joint Exposed: No Bone Exposed: No Treatment Notes Wound #8 (Foot) Wound Laterality: Right, Lateral Cleanser Soap and Water Discharge Instruction: May shower and wash wound with dial antibacterial soap and water prior to dressing change. Peri-Wound Care Sween Lotion (Moisturizing lotion) Discharge Instruction: Apply  moisturizing lotion as directed Topical keystone antibiotic compound Primary Dressing Promogran Prisma Matrix, 4.34 (sq in) (silver collagen) Discharge Instruction: Moisten collagen with saline or hydrogel Secondary Dressing ABD Pad, 8x10 Discharge Instruction: Apply over primary dressing as directed. Optifoam Non-Adhesive  Dressing, 4x4 in Discharge Instruction: Apply over primary dressing as directed. NonWoven Sponge, 4x4 (in/in) Secured With 12M Medipore H Soft Cloth Surgical T ape, 4 x 10 (in/yd) Discharge Instruction: Secure with tape as directed. Compression Wrap Kerlix Roll 4.5x3.1 (in/yd) Discharge Instruction: Apply Kerlix and Coban compression as directed. 12M ACE Elastic Bandage With VELCRO Brand Closure, 4 (in) Compression Stockings Add-Ons Electronic Signature(s) Signed: 11/26/2021 7:35:53 AM By: Max East RN Entered By: [name] on 11/24/2021 13:09:10 -------------------------------------------------------------------------------- Vitals Details Patient Name: Date of Service: [name], [name] 11/24/2021 12:45 PM Medical Record Number: [id number] Patient Account Number: [id number] Date of Birth/Sex: Treating RN: 1985/07/23 (36 y.o. [name] Primary Care Shyasia Funches: [name] Other Clinician: Referring Lian Tanori: Treating Cerenity Goshorn/Extender: [name] in Treatment: 108 Vital Signs Time Taken: 12:51 Temperature (F): 98.0 Height (in): 77 Pulse (bpm): 102 Weight (lbs): 280 Respiratory Rate (breaths/min): 18 Body Mass Index (BMI): 33.2 Blood Pressure (mmHg): 140/88 Capillary Blood Glucose (mg/dl): 124 Reference Range: 80 - 120 mg / dl Electronic Signature(s) Signed: 11/26/2021 7:35:53 AM By: Max East RN Entered By: [name] on 11/24/2021 12:52:18

## 2021-11-29 NOTE — Progress Notes (Signed)
[place], [name] ([id number]) Visit Report for 11/24/2021 Chief Complaint Document Details Patient Name: Date of Service: [name] 11/24/2021 12:45 PM Medical Record Number: [id number] Patient Account Number: [id number] Date of Birth/Sex: Treating RN: 01-19-1986 (36 y.o. [name] Primary Care Provider: [name] Other Clinician: Referring Provider: Treating Provider/Extender: [name] in Treatment: [place] from: Patient Chief Complaint 01/11/2019; patient is here for review of a rather substantial wound over the left fifth plantar metatarsal head extending into the lateral part of his foot 10/24/2019; patient returns to clinic with wounds on his bilateral feet with underlying osteomyelitis biopsy-proven Electronic Signature(s) Signed: 11/24/2021 1:31:31 PM By: [name] MD FACS Entered By: [name] on 11/24/2021 13:31:31 -------------------------------------------------------------------------------- Debridement Details Patient Name: Date of Service: [name], [name] 11/24/2021 12:45 PM Medical Record Number: [id number] Patient Account Number: [id number] Date of Birth/Sex: Treating RN: 02/11/1986 (36 y.o. [name] Primary Care Provider: [name] Other Clinician: Referring Provider: Treating Provider/Extender: [name] in Treatment: 108 Debridement Performed for Assessment: Wound #3 Left,Lateral,Plantar Foot Performed By: Physician [name], MD Debridement Type: Debridement Severity of Tissue Pre Debridement: Fat layer exposed Level of Consciousness (Pre-procedure): Awake and Alert Pre-procedure Verification/Time Out Yes - 13:18 Taken: Start Time: 13:19 T Area Debrided (L x W): otal 3.6 (cm) x 4.5 (cm) = 16.2 (cm) Tissue and other material debrided: Viable, Non-Viable, Callus, Eschar, Slough, Skin: Epidermis, Slough Level:  Skin/Epidermis Debridement Description: Selective/Open Wound Instrument: Curette Bleeding: Minimum Hemostasis Achieved: Pressure Procedural Pain: 0 Post Procedural Pain: 0 Response to Treatment: Procedure was tolerated well Level of Consciousness (Post- Awake and Alert procedure): Post Debridement Measurements of Total Wound Length: (cm) 3.6 Width: (cm) 4.5 Depth: (cm) 0.5 Volume: (cm) 6.362 Character of Wound/Ulcer Post Debridement: Requires Further Debridement Severity of Tissue Post Debridement: Fat layer exposed Post Procedure Diagnosis Same as Pre-procedure Electronic Signature(s) Signed: 11/24/2021 2:22:30 PM By: [name] MD FACS Signed: 11/26/2021 7:35:53 AM By: Blanche East RN Entered By: Blanche East on 11/24/2021 13:22:34 -------------------------------------------------------------------------------- Debridement Details Patient Name: Date of Service: [name], [name] 11/24/2021 12:45 PM Medical Record Number: [id number] Patient Account Number: [id number] Date of Birth/Sex: Treating RN: 06/04/1985 (36 y.o. [name] Primary Care Provider: [name] Other Clinician: Referring Provider: Treating Provider/Extender: [name] in Treatment: 108 Debridement Performed for Assessment: Wound #8 Right,Lateral Foot Performed By: Physician [name], MD Debridement Type: Debridement Severity of Tissue Pre Debridement: Fat layer exposed Level of Consciousness (Pre-procedure): Awake and Alert Pre-procedure Verification/Time Out Yes - 13:18 Taken: Start Time: 13:19 T Area Debrided (L x W): otal 4 (cm) x 2.3 (cm) = 9.2 (cm) Tissue and other material debrided: Viable, Non-Viable, Callus, Eschar, Slough, Skin: Epidermis, Slough Level: Skin/Epidermis Debridement Description: Selective/Open Wound Instrument: Curette, Forceps, Scissors Bleeding: Minimum Hemostasis Achieved: Pressure Procedural Pain: 0 Post Procedural  Pain: 0 Response to Treatment: Procedure was tolerated well Level of Consciousness (Post- Awake and Alert procedure): Post Debridement Measurements of Total Wound Length: (cm) 4 Width: (cm) 2.3 Depth: (cm) 0.1 Volume: (cm) 0.723 Character of Wound/Ulcer Post Debridement: Requires Further Debridement Severity of Tissue Post Debridement: Fat layer exposed Post Procedure Diagnosis Same as Pre-procedure Electronic Signature(s) Signed: 11/24/2021 2:22:30 PM By: [name] MD FACS Signed: 11/26/2021 7:35:53 AM By: Blanche East RN Entered By: Blanche East on 11/24/2021 13:25:26 -------------------------------------------------------------------------------- Debridement Details Patient Name: Date of Service: [name], [name] 11/24/2021 12:45 PM Medical Record Number:  [id number] Patient Account Number: [id number] Date of Birth/Sex: Treating RN: Apr 14, 1985 (36 y.o. [name] Primary Care Provider: Other Clinician: [name] Referring Provider: Treating Provider/Extender: [name] in Treatment: 108 Debridement Performed for Assessment: Wound #12 Left,Dorsal Foot Performed By: Physician [name], MD Debridement Type: Debridement Severity of Tissue Pre Debridement: Fat layer exposed Level of Consciousness (Pre-procedure): Awake and Alert Pre-procedure Verification/Time Out Yes - 13:18 Taken: Start Time: 13:19 T Area Debrided (L x W): otal 0.5 (cm) x 0.5 (cm) = 0.25 (cm) Tissue and other material debrided: Viable, Non-Viable, Eschar, Slough, Slough Level: Non-Viable Tissue Debridement Description: Selective/Open Wound Instrument: Curette Bleeding: Minimum Hemostasis Achieved: Pressure Procedural Pain: 0 Post Procedural Pain: 0 Response to Treatment: Procedure was tolerated well Level of Consciousness (Post- Awake and Alert procedure): Post Debridement Measurements of Total Wound Length: (cm) 0.5 Width: (cm) 0.5 Depth:  (cm) 0.1 Volume: (cm) 0.02 Character of Wound/Ulcer Post Debridement: Requires Further Debridement Severity of Tissue Post Debridement: Fat layer exposed Post Procedure Diagnosis Same as Pre-procedure Electronic Signature(s) Signed: 11/24/2021 2:22:30 PM By: [name] MD FACS Signed: 11/26/2021 7:35:53 AM By: Blanche East RN Entered By: Blanche East on 11/24/2021 13:26:24 -------------------------------------------------------------------------------- Debridement Details Patient Name: Date of Service: [name], [name] 11/24/2021 12:45 PM Medical Record Number: [id number] Patient Account Number: [id number] Date of Birth/Sex: Treating RN: 07/03/1985 (36 y.o. [name] Primary Care Provider: [name] Other Clinician: Referring Provider: Treating Provider/Extender: [name] in Treatment: 108 Debridement Performed for Assessment: Wound #11 [place] Performed By: Physician [name], MD Debridement Type: Debridement Severity of Tissue Pre Debridement: Fat layer exposed Level of Consciousness (Pre-procedure): Awake and Alert Pre-procedure Verification/Time Out Yes - 13:18 Taken: Start Time: 13:19 T Area Debrided (L x W): otal 1 (cm) x 1 (cm) = 1 (cm) Tissue and other material debrided: Non-Viable, Callus, Eschar, Slough, Skin: Epidermis, Slough Level: Skin/Epidermis Debridement Description: Selective/Open Wound Instrument: Curette Bleeding: Minimum Hemostasis Achieved: Pressure Procedural Pain: 0 Post Procedural Pain: 0 Response to Treatment: Procedure was tolerated well Level of Consciousness (Post- Level of Consciousness (Post- Awake and Alert procedure): Post Debridement Measurements of Total Wound Length: (cm) 2 Width: (cm) 2 Depth: (cm) 0.1 Volume: (cm) 0.314 Character of Wound/Ulcer Post Debridement: Requires Further Debridement Severity of Tissue Post Debridement: Fat layer exposed Post  Procedure Diagnosis Same as Pre-procedure Electronic Signature(s) Signed: 11/25/2021 7:33:54 AM By: [name] MD FACS Signed: 11/26/2021 7:35:53 AM By: Blanche East RN Previous Signature: 11/24/2021 2:22:30 PM Version By: [name] MD FACS Entered By: Blanche East on 11/24/2021 17:01:07 -------------------------------------------------------------------------------- HPI Details Patient Name: Date of Service: [name], [name] 11/24/2021 12:45 PM Medical Record Number: [id number] Patient Account Number: [id number] Date of Birth/Sex: Treating RN: 10/18/1985 (36 y.o. 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 He comes in for review of a wound over the plantar fifth metatarsal head extending into the lateral part of the foot. He was followed for this previously by his podiatrist Dr. Cornelius Moras. As the patient tells his story he went to see podiatry first for a swelling he developed on the lateral part of his fifth metatarsal head in May. He states this was "open" by podiatry and the area closed. He was followed up in June  and it was again opened callus removed and it closed promptly. There were plans being made for surgery on the fifth metatarsal head in June however his blood sugar was apparently too high for anesthesia. Apparently the area was debrided and opened again in June and it is never closed since. Looking over the records from podiatry I am really not able to follow this. It was clear when he was first seen it was before 5/14 at that point he already had a wound. By 5/17 the ulcer was resolved. I do not see anything about a procedure. On 5/28 noted to have pre-ulcerative moderate keratosis.  X-ray noted 1/5 contracted toe and tailor's bunion and metatarsal deformity. On a visit date on 09/28/2018 the dorsal part of the left foot it healed and resolved. There was concern about swelling in his lower extremity he was sent to the ER.. As far as I can tell he was seen in the ER on 7/12 with an ulcer on his left foot. A DVT rule out of the left leg was negative. I do not think I have complete records from podiatry but I am not able to verify the procedures this patient states he had. He states after the last procedure the wound has never closed although I am not able to follow this in the records I have from podiatry. He has not had a recent x-ray The patient has been using Neosporin on the wound. He is wearing a Darco shoe. He is still very active up on his foot working and exercising. Past medical history; type 2 diabetes ketosis-prone, leg swelling with a negative DVT study in July. Non-smoker ABI in our clinic was 0.85 on the left 10/16; substantial wound on the plantar left fifth met head extending laterally almost to the dorsal fifth MTP. We have been using silver alginate we gave him a Darco forefoot off loader. An x-ray did not show evidence of osteomyelitis did note soft tissue emphysema which I think was due to gas tracking through an open wound. There is no doubt in my mind he requires an MRI 10/23; MRI not booked until 3 November at the earliest this is largely due to his glucose sensor in the right arm. We have been using silver alginate. There has been an improvement 10/29; I am still not exactly sure when his MRI is booked for. He says it is the third but it is the 10th in epic. This definitely needs to be done. He is running a low-grade fever today but no other symptoms. No real improvement in the 1 02/26/2019 patient presents today for a follow-up visit here in our clinic he is last been seen in the clinic on October 29. Subsequently we were working on getting MRI to evaluate  and see what exactly was going on and where we would need to go from the standpoint of whether or not he had osteomyelitis and again what treatments were going be required. Subsequently the patient ended up being admitted to the hospital on 02/07/2019 and was discharged on 02/14/2019. This is a somewhat interesting admission with a discharge diagnosis of pneumonia due to COVID-19 although he was positive for COVID-19 when tested at the urgent care but negative x2 when he was actually in the hospital. With that being said he did have acute respiratory failure with hypoxia and it was noted he also have a left foot ulceration with osteomyelitis. With that being said he did require oxygen for his pneumonia and I level  4 L. He was placed on antivirals and steroids for the COVID-19. He was also transferred to the [facility] at one point. Nonetheless he did subsequently discharged home and since being home has done much better in that regard. The CT angiogram did not show any pulmonary embolism. With regard to the osteomyelitis the patient was placed on vancomycin and Zosyn while in the hospital but has been changed to Augmentin at discharge. It was also recommended that he follow- up with wound care and podiatry. Podiatry however wanted him to see [name] according to the patient prior to them doing anything further. His hemoglobin A1c was 9.9 as noted in the hospital. Have an MRI of the left foot performed while in the hospital on 02/04/2019. 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 There is an overlying plantar open wound noted an abscess tracking back along the lateral aspect of the fifth metatarsal shaft. There is otherwise diffuse cellulitis and mild fasciitis without findings of polymyositis. The patient did have recently pneumonia secondary to COVID-19 I looked in the chart through epic and it does  appear that the patient may need to have an additional x-ray just to ensure everything is cleared and that he has no airspace disease prior to putting him into the [name]. 03/05/2019; patient was readmitted to the clinic last week. He was hospitalized twice for a viral upper respiratory tract infection from 11/1 through 11/4 and then 11/5 through 11/12 ultimately this turned out to be Covid pneumonitis. Although he was discharged on oxygen he is not using it. He says he feels fine. He has no exercise limitation no cough no sputum. His O2 sat in our clinic today was 100% on room air. 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 He received Vanco and Zosyn in the hospital and then was discharged on 2 weeks of Augmentin. I do not see any relevant cultures. He was supposed to follow-up with infectious disease but I do not see that he has an appointment. 12/8; patient saw Dr. Novella Olive of infectious disease last week. He felt that he had had adequate antibiotic therapy. He did not go to follow-up with Dr. Amalia Hailey of podiatry and I have again talked to him about the pros and cons of this. He does not want to consider a ray amputation of this time. He is aware of the risks of recurrence, migration etc. He started HBO today and tolerated this well. He can complete the Augmentin that I gave him last week. I have looked over the lab work that Dr. Chana Bode ordered his C-reactive protein was 3.3 and his sedimentation rate was 17. The C-reactive protein is never really been measurably that high in this patient 12/15; not much change in the wound today however he has undermining along the lateral part of the foot again more extensively than last week. He has some rims of epithelialization. We have been using silver alginate. He is undergoing hyperbarics but did not dive today 12/18; in for his obligatory  first total contact cast change. Unfortunately there was pus coming from the undermining area around his fifth metatarsal head. This was cultured but will preclude reapplication of a cast. He is seen in conjunction with HBO 12/24; patient had staph lugdunensis in the wound in the undermining area laterally last time. We  put him on doxycycline which should have covered this. The wound looks better today. I am going to give him another week of doxycycline before reattempting the total contact cast 12/31; the patient is completing antibiotics. Hemorrhagic debris in the distal part of the wound with some undermining distally. He also had hyper granulation. Extensive debridement with a #5 curette. The infected area that was on the lateral part of the fifth met head is closed over. I do not think he needs any more antibiotics. Patient was seen prior to HBO. Preparations for a total contact cast were made in the cast will be placed post hyperbarics 04/11/19; once again the patient arrives today without complaint. He had been in a cast all week noted that he had heavy drainage this week. This resulted in large raised areas of macerated tissue around the wound 1/14; wound bed looks better slightly smaller. Hydrofera Blue has been changing himself. He had a heavy drainage last week which caused a lot of maceration around the wound so I took him out of a total contact cast he says the drainage is actually better this week He is seen today in conjunction with HBO 1/21; returns to clinic. He was up in [place] for a day or 2 attending a funeral. He comes back in with the wound larger and with a large area of exposed bone. He had osteomyelitis and septic arthritis of the fifth left metatarsal head while he was in hospital. He received IV antibiotics in the hospital for a prolonged period of time then 3 weeks of Augmentin. Subsequently I gave him 2 weeks of doxycycline for more superficial wound infection. When I saw  this last week the wound was smaller the surface of the wound looks satisfactory. 1/28; patient missed hyperbarics today. Bone biopsy I did last time showed Enterococcus faecalis and Staphylococcus lugdunensis . He has a wide area of exposed bone. We are going to use silver alginate as of today. I had another ethical discussion with the patient. 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 In this situation I think the likelihood of healing this is low. Therefore I have recommended a ray amputation and with the patient's agreement I have referred him to Dr. Doran Durand. The other issue is that his compliance with hyperbarics has been minimal because of his work schedule and given his underlying decision I am going to stop this today READMISSION 10/24/2019 MRI 09/29/2019 left foot IMPRESSION: 1. Apparent skin ulceration inferior and lateral to the 5th metatarsal base with underlying heterogeneous T2 signal and enhancement in the subcutaneous fat. Small peripherally enhancing fluid collections along the plantar and lateral aspects of the 5th metatarsal base suspicious for abscesses. 2. Interval amputation through the mid 5th metatarsal with nonspecific low-level marrow edema and enhancement. Given the proximity to the adjacent soft tissue inflammatory changes, osteomyelitis cannot be excluded. 3. The additional bones appear unremarkable. MRI 09/29/2019 right foot IMPRESSION: 1. Soft tissue ulceration lateral to the 5th MTP joint. There is low-level T2 hyperintensity within the 4th and 5th metatarsal heads and adjacent proximal phalanges without abnormal T1 signal or cortical destruction. These findings are nonspecific and could be seen with early marrow edema, hyperemia or early osteomyelitis. No evidence of septic joint. 2. Mild tenosynovitis and synovial enhancement associated with the extensor digitorum tendons at the level of the midfoot. 3. Diffuse low-level  muscular T2 hyperintensity and enhancement, most consistent with diabetic myopathy. LEFT FOOT BONE Methicillin resistant staphylococcus aureus Staphylococcus lugdunensis MIC  MIC CIPROFLOXACIN >=8 RESISTANT Resistant <=0.5 SENSI... Sensitive CLINDAMYCIN <=0.25 SENS... Sensitive >=8 RESISTANT Resistant ERYTHROMYCIN >=8 RESISTANT Resistant >=8 RESISTANT Resistant GENTAMICIN <=0.5 SENSI... Sensitive <=0.5 SENSI... Sensitive Inducible Clindamycin NEGATIVE Sensitive NEGATIVE Sensitive OXACILLIN >=4 RESISTANT Resistant 2 SENSITIVE Sensitive RIFAMPIN <=0.5 SENSI... Sensitive <=0.5 SENSI... Sensitive TETRACYCLINE <=1 SENSITIVE Sensitive <=1 SENSITIVE Sensitive TRIMETH/SULFA <=10 SENSIT Sensitive <=10 SENSIT Sensitive ... Marland Kitchen.. VANCOMYCIN 1 SENSITIVE Sensitive <=0.5 SENSI... Sensitive Right foot bone . Component 3 wk ago Specimen Description BONE Special Requests RIGHT 4 METATARSAL SAMPLE B Gram Stain NO WBC SEEN NO ORGANISMS SEEN Culture RARE METHICILLIN RESISTANT STAPHYLOCOCCUS AUREUS NO ANAEROBES ISOLATED Performed at [facility], [place] [address], [place],  [address] Report Status 10/08/2019 FINAL Organism ID, Bacteria METHICILLIN RESISTANT STAPHYLOCOCCUS AUREUS Resulting Agency [facility] Susceptibility Methicillin resistant staphylococcus aureus MIC CIPROFLOXACIN >=8 RESISTANT Resistant CLINDAMYCIN <=0.25 SENS... Sensitive ERYTHROMYCIN >=8 RESISTANT Resistant GENTAMICIN <=0.5 SENSI... Sensitive Inducible Clindamycin NEGATIVE Sensitive OXACILLIN >=4 RESISTANT Resistant RIFAMPIN <=0.5 SENSI... Sensitive TETRACYCLINE <=1 SENSITIVE Sensitive TRIMETH/SULFA <=10 SENSIT Sensitive ... VANCOMYCIN 1 SENSITIVE Sensitive This is a patient we had in clinic earlier this year with a wound over his left fifth metatarsal head. 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 Doran Durand on 05/16/2019. He seems to have developed problems with wounds on his bilateral feet in June 2021 although he may have had problems earlier than this. He was in an urgent care with a right foot ulcer on 09/26/2019 and given a course of doxycycline. This was apparently after having trouble getting into see orthopedics. He was seen by podiatry on 09/28/2019 noted to have bilateral lower extremity ulcers including the left lateral fifth metatarsal base and the right subfifth met head. It was noted that had purulent drainage at that time. He required hospitalization from 6/20 through 7/2. This was because of worsening right foot wounds. He underwent bilateral operative incision and drainage and bone biopsies bilaterally. Culture results are listed above. He has been referred back to clinic by Dr. Jacqualyn Posey of podiatry. He is also followed by Dr. Megan Salon who saw him yesterday. He was discharged from hospital on Zyvox Flagyl and Levaquin and yesterday changed to doxycycline Flagyl and Levaquin. His inflammatory markers on 6/26 showed a sedimentation rate of 129 and a C-reactive protein of 5. This is improved to 14 and 1.3 respectively. This would indicate improvement. ABIs in our clinic today were 1.23 on the right and 1.20 on the left 11/01/2019 on evaluation today patient appears to be doing fairly well in regard to the wounds on his feet at this point. Fortunately there is no signs of active infection at this time. No fevers, chills, nausea, vomiting, or diarrhea. He currently is seeing infectious disease and still under their care at this point. Subsequently he also has both wounds which she has not been using collagen on as he did not receive that in his packaging he did not call us and let us know that. Apparently that just  was missed on the order. Nonetheless we will get that straightened out today. 8/9-Patient returns for bilateral foot wounds, using Prisma with hydrogel moistened dressings, and the wounds appear stable. Patient using surgical shoes, avoiding much pressure or weightbearing as much as possible 8/16; patient  has bilateral foot wounds. 1 on the right lateral foot proximally the other is on the left mid lateral foot. Both required debridement of callus and thick skin around the wounds. We have been using silver collagen 8/27; patient has bilateral lateral foot wounds. The area on the left substantially surrounded by callus and dry skin. This was removed from the wound edge. The underlying wound is small. The area on the right measured somewhat smaller today. 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 Unfortunately I did not update his antibiotics during today's visit. 9/10 I reviewed Dr. Hale Bogus last notes he felt he had completed antibiotics his inflammatory markers were reasonably well controlled. He has a small wound on the lateral left foot and a tiny area on the right which is just above closed. He is using Hydrofera Blue with border foam he has bilateral surgical shoes 9/24; 2 week f/u. doing well. right foot is closed. left foot still undermined. 10/14; right foot remains closed at the fifth met head. The area over the base of the left fifth metatarsal has a small open area but considerable undermining towards the plantar foot. Thick callus skin around this suggests an adequate pressure relief. We have talked about this. He says he is going to go back into his cam boot. I suggested a total contact cast he did not seem enamored with this suggestion 10/26; left foot base of the fifth metatarsal. Same condition as last time. He has skin over the area with an open wound however the skin is not adherent. He went to see Dr. Earleen Newport who  did an x-ray and culture of his foot I have not reviewed the x-ray but the patient was not told anything. He is on doxycycline 11/11; since the patient was last here he was in the emergency room on 10/30 he was concerned about swelling in the left foot. They did not do any cultures or x-rays. They changed his antibiotics to cephalexin. Previous culture showed group B strep. The cephalexin is appropriate as doxycycline has less than predictable coverage. Arrives in clinic today with swelling over this area under the wound. He also has a new wound on the right fifth metatarsal head 11/18; the patient has a difficult wound on the lateral aspect of the left fifth metatarsal head. The wound was almost ballotable last week I opened it slightly expecting to see purulence however there was just bleeding. I cultured this this was negative. X-ray unchanged. We are trying to get an MRI but I am not sure were going to be able to get this through his insurance. He also has an area on the right lateral fifth metatarsal head this looks healthier 12/3; the patient finally got our MRI. Surprisingly this did not show osteomyelitis. I did show the soft tissue ulceration at the lateral plantar aspect of the fifth metatarsal base with a tiny residual 6 mm abscess overlying the superficial fascia I have tried to culture this area I have not been able to get this to grow anything. Nevertheless the protruding tissue looks aggravated. I suspect we should try to treat the underlying "abscess with broad-spectrum antibiotics. I am going to start him on Levaquin and Flagyl. He has much less edema in his legs and I am going to continue to wrap his legs and see him weekly 12/10. I started Levaquin and Flagyl on him last week. He just picked up the Flagyl apparently there was some  delay. The worry is the wound on the left fifth metatarsal base which is substantial and worsening. His foot looks like he inverts at the ankle making this  a weightbearing surface. Certainly no improvement in fact I think the measurements of this are somewhat worse. We have been using 12/17; he apparently just got the Levaquin yesterday this is 2 weeks after the fact. He has completed the Flagyl. The area over the left fifth metatarsal base still has protruding granulation tissue although it does not look quite as bad as it did some weeks ago. He has severe bilateral lymphedema although we have not been treating him for wounds on his legs this is definitely going to require compression. There was so much edema in the left I did not wish to put him in a total contact cast today. I am going to increase his compression from 3-4 layer. The area on the right lateral fifth met head actually look quite good and superficial. 12/23; patient arrived with callus on the right fifth met head and the substantial hyper granulated callused wound on the base of his fifth metatarsal. He says he is completing his Levaquin in 2 days but I do not think that adds up with what I gave him but I will have to double check this. We are using Hydrofera Blue on both areas. My plan is to put the left leg in a cast the week after New Year's 04/06/2020; patient's wounds about the same. Right lateral fifth metatarsal head and left lateral foot over the base of the fifth metatarsal. There is undermining on the left lateral foot which I removed before application of total contact cast continuing with Hydrofera Blue new. Patient tells me he was seen by endocrinology today lab work was done [Dr. Kerr]. Also wondering whether he was referred to cardiology. I went over some lab work from previously does not have chronic renal failure certainly not nephrotic range proteinuria he does have very poorly controlled diabetes but this is not his most updated lab work. Hemoglobin A1c has been over 11 1/10; the patient had a considerable amount of leakage towards mid part of his left foot with macerated  skin however the wound surface looks better the area on the right lateral fifth met head is better as well. I am going to change the dressing on the left foot under the total contact cast to silver alginate, continue with Hydrofera Blue on the right. 1/20; patient was in the total contact cast for 10 days. Considerable amount of drainage although the skin around the wound does not look too bad on the left foot. The area on the right fifth metatarsal head is closed. Our nursing staff reports large amount of drainage out of the left lateral foot wound 1/25; continues with copious amounts of drainage described by our intake staff. PCR culture I did last week showed E. coli and Enterococcus faecalis and low quantities. Multiple resistance genes documented including extended spectrum beta lactamase, MRSA, MRSE, quinolone, tetracycline. The wound is not quite as good this week as it was 5 days ago but about the same size 2/3; continues with copious amounts of malodorous drainage per our intake nurse. The PCR culture I did 2 weeks ago showed E. coli and low quantities of Enterococcus. There were multiple resistance genes detected. I put Neosporin on him last week although this does not seem to have helped. The wound is slightly deeper today. Offloading continues to be an issue here although with the  amount of drainage she has a total contact cast is just not going to work 2/10; moderate amount of drainage. Patient reports he cannot get his stocking on over the dressing. I told him we have to do that the nurse gave him suggestions on how to make this work. The wound is on the bottom and lateral part of his left foot. Is cultured predominantly grew low amounts of Enterococcus, E. coli and anaerobes. There were multiple resistance genes detected including extended spectrum beta lactamase, quinolone, tetracycline. I could not think of an easy oral combination to address this so for now I am going to do topical  antibiotics provided by [facility] I think the main agents here are vancomycin and an aminoglycoside. We have to be able to give him access to the wounds to get the topical antibiotic on 2/17; moderate amount of drainage this is unchanged. He has his Keystone topical antibiotic against the deep tissue culture organisms. He has been using this and changing the dressing daily. Silver alginate on the wound surface. 2/24; using Keystone antibiotic with silver alginate on the top. He had too much drainage for a total contact cast at one point although I think that is improving and I think in the next week or 2 it might be possible to replace a total contact cast I did not do this today. In general the wound surface looks healthy however he continues to have thick rims of skin and subcutaneous tissue around the wide area of the circumference which I debrided 06/04/2020 upon evaluation today patient appears to be doing well in regard to his wound. I do feel like he is showing signs of improvement. There is little bit of callus and dead tissue around the edges of the wound as well as what appears to be a little bit of a sinus tract that is off to the side laterally I would perform debridement to clear that away today. 3/17; left lateral foot. The wound looks about the same as I remember. Not much depth surface looks healthy. No evidence of infection 3/25; left lateral foot. Wound surface looks about the same. Separating epithelium from the circumference. There really is no evidence of infection here however not making progress by my view 3/29; left lateral foot. Surface of the wound again looks reasonably healthy still thick skin and subcutaneous tissue around the wound margins. There is no evidence of infection. One of the concerns being brought up by the nurses has again the amount of drainage vis--vis continued use of a total contact cast 4/5; left lateral foot at roughly the base of the fifth metatarsal.  Nice healthy looking granulated tissue with rims of epithelialization. The overall wound measurements are not any better but the tissue looks healthy. The only concern is the amount of drainage although he has no surrounding maceration with what we have been doing recently to absorb fluid and protect his skin. He also has lymphedema. 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 He states that the kerlix/coban under the TCC rolled down and could not get it back up. He has been using an offloading scooter and has somehow hurt his right foot using this device. This happened last week. He states that the side  of his right foot developed a blister and opened. The top of his foot also has a few small open wounds he thinks is due to his socks rubbing in his shoes. He has not been using any dressings to the wound. He denies purulent drainage, fever/chills or erythema to the wounds. 4/22; patient presents for 1 week follow-up. He developed new wounds to the right foot that were evaluated at last clinic visit. He continues to have a total contact cast to the left leg and he reports no issues. He has been using silver collagen to the right foot wounds with no issues. He denies purulent drainage, fever/chills or erythema to the right foot wounds. He has no complaints today 4/25; patient presents for 1 week follow-up. He has a total contact cast of the left leg and reports no issues. He has been using silver alginate to the right foot wound. He denies purulent drainage, fever/chills or erythema to the right foot wounds. 5/2 patient presents for 1 week follow-up. T contact cast on the left. The wound which is on the base of the plantar foot at the base of the fifth  metatarsal otal actually looks quite good and dimensions continue to gradually contract. HOWEVER the area on the right lateral fifth metatarsal head is much larger than what I remember from 2 weeks ago. Once more is he has significant levels of hypergranulation. Noteworthy that he had this same hyper granulated response on his wound on the left foot at one point in time. So much so that he I thought there was an underlying fluid collection. Based on this I think this just needs debridement. 5/9; the wound on the left actually continues to be gradually smaller with a healthy surface. Slight amount of drainage and maceration of the skin around but not too bad. However he has a large wound over the right fifth metatarsal head very much in the same configuration as his left foot wound was initially. I used silver nitrate to address the hyper granulated tissue no mechanical debridement 5/16; area on the left foot did not look as healthy this week deeper thick surrounding macerated skin and subcutaneous tissue. The area on the right foot fifth met head was about the same The area on the right ankle that we identified last week is completely broken down into an open wound presumably a stocking rubbing issue 5/23; patient has been using a total contact cast to the left side. He has been using silver alginate underneath. He has also been using silver alginate to the right foot wounds. He has no complaints today. He denies any signs of infection. 5/31; the left-sided wound looks some better measure smaller surface granulation looks better. We have been using silver alginate under the total contact cast The large area on his right fifth met head and right dorsal foot look about the same still using silver alginate 6/6; neither side is good as I was hoping although the surface area dimensions are better. A lot of maceration on his left and right foot around the wound edge. Area on the dorsal right foot looks  better. He says he was traveling. I am not sure what does the amount of maceration around the plantar wounds may be drainage issues 6/13; in general the wound surfaces look quite good on both sides. Macerated skin and raised edges around the wound required debridement although in general especially on the left the surface area seems improved. The area on the right dorsal ankle is  about the same I thought this would not be such a problem to close 6/20; not much change in either wound although the one on the right looks a little better. Both wounds have thick macerated edges to the skin requiring debridements. We have been using silver alginate. The area on the dorsal right ankle is still open I thought this would be closed. 6/28; patient comes in today with a marked deterioration in the right foot wound fifth met head. Wide area of exposed bone this is a drastic change from last time. The area on the left there we have been casting is stagnant. We have been using silver alginate in both wound areas. 7/5; bone culture I did for PCR last time was positive for Pseudomonas, group B strep, Enterococcus and Staph aureus. There was no suggestion of methicillin resistance or ampicillin resistant genes. This was resistant to tetracycline however He comes into the clinic today with the area over his right plantar fifth metatarsal head which had been doing so well 2 weeks ago completely necrotic feeling bone. I do not know that this is going to be salvageable. The left foot wound is certainly no smaller but it has a better surface and is superficial. 7/8; patient called in this morning to say that his total contact cast was rubbing against his foot. He states he is doing fine overall. He denies signs of infection. 7/12; continued deterioration in the wound over the right fifth metatarsal head crumbling bone. This is not going to be salvageable. The patient agrees and wants to be referred to Dr. Doran Durand which we  will attempt to arrange as soon as possible. I am going to continue him on antibiotics as long as that takes so I will renew those today. The area on the left foot which is the base of the fifth metatarsal continues to look somewhat better. Healthy looking tissue no depth no debridement is necessary here. 7/20; the patient was kindly seen by Dr. Doran Durand of orthopedics on 10/19/2020. 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 Towards this end we have taken him out of the total contact cast on the left we will put him in a wrap with Hydrofera Blue. As I understand things surgery is planned for 7/21 7/27; patient had his surgery last Thursday. 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 Apparently everything went well we did not still disturb that today The area on the left foot actually looks quite good. He has been much less mobile which probably explains this he did not seem to do well in the total contact cast secondary to drainage and maceration I think. 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 Fortunately there is no evidence of active infection at this time. No fevers, chills, nausea, vomiting, or diarrhea. Overall I think that he is actually doing extremely well. Nonetheless I do believe that he is staying off of this more following the surgery in his right foot that is the reason the left is doing so great. 8/16; left plantar foot wound. This looks smaller than the last time I saw this he is using Hydrofera Blue. The surgical wound on the right foot is being followed by Dr. Doran Durand we did not look at this today. He has surgical shoes on  both feet 8/23; left plantar foot wound not as good this week. Surrounding macerated skin and subcutaneous tissue everything looks moist and wet. I do not think he  is offloading this adequately. He is using a surgical shoe Apparently the right foot surgical wound is not open although I did not check his foot 8/31; left plantar foot lateral aspect. Much improved this week. He has no maceration. Some improvement in the surface area of the wound but most impressively the depth is come in we are using silver alginate. The patient is a Product/process development scientist. He is asked that we write him a letter so he can go back to work. I have also tried to see if we can write something that will allow him to limit the amount of time that he is on his foot at work. 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 Hopefully the latter can be avoided 9/6; I believe the patient missed an appointment last week. He arrives in today with a wound looking roughly the same certainly no better. Undermining laterally and also inferiorly. We used molecuLight today in training with the patient's permission.. We are using silver alginate 9/21 wound is measuring bigger this week although this may have to do with the aggressive circumferential debridement last week in response to the blush fluorescence on the MolecuLight. Culture I did last week showed significant MSSA and E. coli. I put him on Augmentin but he has not started it yet. We are also going to send this for compounded antibiotics at [facility]. There is no evidence of systemic infection 9/29; silver alginate. His Keystone arrived. He is completing Augmentin in 2 days. Offloading in a cam boot. Moderate drainage per our intake staff 10/5; using silver alginate. He has been using his [place]. He has completed his Augmentin. Per our intake nurse still a lot of drainage, far too much to consider a total contact cast. Wound measures about the same. He had the same undermining area that I defined last week from a roughly 11-3. I remove this today 10/12; using silver  alginate he is using the [place]. He comes in for a nurse visit hence we are applying [employer] twice a week. Measuring slightly better today and less notable drainage. Extensive debridement of the wound edge last time 10/18; using topical Keystone and silver alginate and a soft cast. Wound measurements about the same. Drainage was through his soft cast. We are changing this twice a week Tuesdays and Friday 10/25; comes in with moderate drainage. Still using Keystone silver alginate and a soft cast. Wound dimensions completely the same.He has a lot of edema in the left leg he has lymphedema. Asking for [name] to consider wrapping him as he cannot get his stocking on over the soft cast 11/2; comes in with moderate to large drainage slightly smaller in terms of width we have been using [place]. His wound looks satisfactory but not much improvement 11/4; patient presents today for obligatory cast change. Has no issues or complaints today. He denies signs of infection. 11/9; patient traveled this weekend to [place], was on the cast quite a bit. Staining of the cast with black material from his walking boot. Drainage was not quite as bad as we feared. Using silver alginate and Keystone 11/16; we do not have size for cast therefore we have been putting a soft cast on him since the change on Friday. Still  a significant amount of drainage necessitating changing twice a week. We have been using the Keystone at cast changes either hard or soft as well as silver alginate Comes in the clinic with things actually looking fairly good improvement in width. He says his offloading is about the same 02/24/2021 upon evaluation today patient actually comes back in and is doing excellent in regard to his foot ulcer this is significantly smaller even compared to the last visit. The soft cast seems to have done extremely well for him which is great news. I do not see any signs of infection minimal debridement will be needed  today. 11/30; left lateral foot much improved half a centimeter improvement in surface area. No evidence of infection. He seems to be doing better with the soft cast in the TCC therefore we will continue with this. He comes back in later in the week for a change with the nurses. This is due to drainage 12/6; no improvement in dimensions. Under illumination some debris on the surface we have been using silver alginate, soft cast. If there is anything optimistic here he seems to have have less drainage 12/13. Dimensions are improved both length and width and slightly in depth. Appears to be quite healthy today. Raised edges of this thick skin and callus around the edges however. He is in a soft cast were bringing him back once for a change on Friday. Drainage is better 12/20. Dimensions are improved. He still has raised edges of thick skin and callus around the edges. We are using a soft cast 12/28; comes in today with thick callus around the wound. Using silver under alginate under a soft cast. I do not think there is much improvement in any measurement 2023 04/06/2021; patient was put in a total contact cast. Unfortunately not much change in surface area 1/10; not much different still thick callus and skin around the edge in spite of the total contact cast. This was just debrided last week we have been using the [facility] compounded antibiotic and silver alginate under a total contact cast 1/18 the patient's wound on the left side is doing nicely. smaller HOWEVER he comes in today with a wound on the right foot laterally. blister most likely serosangquenous drainage 1/24; the patient continues to do well in terms of the plantar left foot which is continued to contract using silver alginate under the total contact cast HOWEVER the right lateral foot is bigger with denuded skin around the edges. I used pickups and a #15 scalpel to remove this this looks like the remanence of a large blister. Cannot rule  out infection. Culture in this area I did last week showed Staphylococcus lugdunensis few colonies. I am going to try to address this with his [employer] antibiotic that is done so well on the left having linezolid and this should cover the staph 2/1; the patient's wound on his left foot which was the original plantar foot wound thick skin and eschar around the edges even in the total contact cast but the wound surface does not look too bad The real problem is on how his right lateral foot at roughly the base of the fifth metatarsal. The wound is completely necrotic more worrisome than that there is swelling around the edges of this. We have been using silver alginate on both wounds and Keystone on the right foot. Unfortunately I think he is going to require systemic antibiotics while we await cultures. He did not get the x-ray done that we  ordered last week [lost the prescription 2/7; disappointingly in the area on the left foot which we are treating with a total contact cast is still not closed although it is much smaller. He continues to have a lot of callus around the wound edge. -Right lateral foot culture I did last week was negative x-ray also negative for osteomyelitis. 2/15: TCC silver alginate on the left and silver alginate on the right lateral. No real improvement in either area 05/26/2021: T oday, the wounds are roughly the same size as at his previous visit, post-debridement. He continues to endorse fairly substantial drainage, particularly on the right. He has been in a total contact cast on the left. There is still some callus surrounding this lesion. On the right, the periwound skin is quite macerated, along with surrounding callus. The center of the right-sided wound also has some dark, densely adherent material, which is very difficult to remove. 06/02/2021: Today, both wounds are slightly smaller. He has been using zinc oxide ointment around the right ulcer and the degree of maceration  has improved markedly. There continues to be an area of nonviable tissue in the center of the right sided ulcer. The left-sided wound, which has been in the total contact cast. Appears clean and the degree of callus around it is less than previously. 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 He had to take the stairs and both wounds have increased in size. The left foot, which has been in a total contact cast, has developed a tunnel tracking to the lateral aspect of his foot. The nonviable tissue in the center of the right-sided ulcer remains recalcitrant to debridement. There is significant undermining surrounding the entirety of the left sided wound. 06/16/2021: The elevator at school has been fixed and the patient has been able to avoid putting as much weight on his wounds over the past week. We converted the left foot wound into a single lesion today, but despite this, the wound is actually smaller. The base is healthy with limited periwound callus. On the right, the central necrotic area is still present. He continues to be quite macerated around the right sided wound, despite applying barrier cream. This does, however, have the benefit of softening the callus to make it more easily removable. 06/23/2021: Today, the left wound is smaller. The lateral aspect that had opened up previously is now closed. The wound base has a healthy bed of granulation tissue and minimal slough. Unfortunately, on the right, the wound is larger and continues to be fairly macerated. He has also reopened the wound at his right ankle. He thinks this is due to the gait he has adopted secondary to his total contact cast and boot. 06/30/2021: T oday, both wounds are a little bit larger. The lateral aspect on the left has remained closed. He continues to have significant periwound maceration. The culture that I took from the right sided wound grew a population of  bacteria that is not covered by his current [facility] antibiotic. The center of the right- sided wound continues to appear necrotic with nonviable fat. It probes deeper today, but does not reach bone. 07/07/2021: The periwound maceration is a little bit less today. The right lateral foot wound has some areas that appear more viable and the necrotic center also looks a little bit better. The wound on the dorsal surface of his right foot near the ankle is contracting and the surface appears healthy.  The left plantar wound surface looks healthy, but there is some new undermining on the medial portion. He did get his new Keystone antibiotic and began applying that to the right foot wound on Saturday. 07/14/2021: The intake nurse reported substantial drainage from his wounds, but the periwound skin actually looks better than is typical for him. The wound on the dorsal surface of his right foot near the ankle is smaller and just has a small open area underneath some dried eschar. The left plantar wound surface looks healthy and there has been no significant accumulation of callus. The right lateral foot wound looks quite a bit better, with the central portion, which typically appears necrotic, looking more viable albeit pale. 07/22/2021: His left foot is extremely macerated today. The wound is about the same size. The wound on the dorsal surface of his right foot near the ankle had closed, but he traumatized it removing the dressing and there is a tiny skin tear in that location. The right lateral foot wound is bigger, but the surface appears healthy. 07/30/2021: The wound on the dorsal surface of his right foot near the ankle is closed. The right lateral foot wound again is a little bit bigger due to some undermining. The periwound skin is in better condition, however. He has been applying zinc oxide. The wound surface is a little bit dry today. On the left, he does not have the substantial maceration that we  frequently see. The wound itself is smaller and has a clean surface. 08/06/2021: Both wounds seem to have deteriorated over the past week. The right lateral foot wound has a dry surface but the periwound is boggy.. Overall wound dimensions are about the same. On the left, the wound is about the same size, but there is more undermining present underneath periwound callus. 08/13/2021: The right sided wound looks about the same, but on the left there has been substantial deterioration. The undermining continues to extend under periwound callus. Once this was removed, substantial extension of the wound was present. There is no odor or purulent drainage but clearly the wounds have broken down. 08/20/2021: The wounds look about the same today. He has been out of his total contact cast and has just been changing the dressings using topical Keystone with PolyMem Ag, Kerlix and Ace bandages. The wound on the top of his right ankle has reopened but this is quite small. There was a little bit of purulent material that I expressed when examining this wound. 08/24/2021: After the aggressive debridement I performed at his last visit, the wounds actually look a little bit better today. They are smaller with the exception of the wound on the top of his right ankle which is a little bit bigger as some more skin pulled off when he was changing his dressing. We are using topical Keystone with PolyMem Ag Kerlix and Ace bandages. 09/02/2021: There has been really no change to any of his wounds. 09/16/2021: The patient was hospitalized last week with nausea, vomiting, and dehydration. He says that while he was in the hospital, his wounds were not really addressed properly. T oday, both plantar foot wounds are larger and the periwound skin is macerated. The wound on the dorsum of his right foot has a scab on the top. The right foot now has a crater where previously he had had nonviable fat. It looks as though this simply died and  fell out. The periwound callus is wet. 09/24/2021: His wounds have deteriorated somewhat since his  last visit. The wound on the dorsum of his right foot near his ankle is larger and has more nonviable tissue present. The crater in his right foot is even deeper; I cannot quite palpate or probe to bone but I am sure it is close. The wound on his left plantar foot has an odd boggy area in the center that almost feels as though it has fluid within it. He has run out of his topical Keystone antibiotic. We are using silver alginate on his wounds. 09/29/2021: He has developed a new wound on the dorsum of his left foot near his ankle. He says he thinks his wrapping is rubbing in that site. I would concur with this as the wound on his right ankle is larger. The left foot looks about the same. The right foot has the crater that was present last week. No significant slough accumulation, but his foot remains quite swollen and warm despite oral antibiotic therapy. 10/08/2021: All of his wounds look about the same as last week. He did not start his oral antibiotics that are prescribed until just a couple of days ago; his [employer] compounded antibiotics formula has been changed and he is awaiting delivery of the new recipe. His MRI that was scheduled for earlier this week was canceled as no prior authorization had been obtained; unfortunately the tech responsible sent an email to my old [place] email, which I no longer use nor have access to. 10/18/2021: The wounds on his bilateral dorsal feet near the ankles are both improved. They are smaller and have just some eschar and slough buildup. The left plantar wound has a fair amount of undermining, but the surface is clean. There is some periwound callus accumulation. On the right plantar foot, there is nonviable fat leading to a deep tunnel that tracks towards his dorsal medial foot. There is periwound callus and slough accumulation, as well. His right foot and leg  remain swollen as compared to the left. 10/25/2021: The wounds on his bilateral dorsal feet and at the ankles have broken down somewhat. They are little bit larger than last week. The left plantar wound continues to undermine laterally but the surface is clean. The right plantar foot wound shows some decreased depth in the tunnel tracking towards his dorsal medial foot. He has not yet had the Doppler study that I ordered; it sounds like there is some confusion about the scheduling of the procedure. In addition, the MRI was denied and I have taken steps to appeal the denial. 11/24/2021: Since our last visit, Mr. Lader was admitted to the hospital where an MRI suggested osteomyelitis. He was taken the operating room by podiatry. Bone biopsies were negative for osteomyelitis. They debrided his wounds and applied myriad matrix. He saw them last week and they removed his staples. He is here today to continue his wound healing process. T oday, both of the dorsal foot/ankle wounds are substantially smaller. There is just a little eschar overlying the left sided wound and some eschar and slough on the right. The right plantar foot ulcer has the healthiest surface of granulation tissue that I have seen to date. A portion of the myriad matrix failed to take and was hanging loose. It appears that myriad morcells were placed into the tunnel closest to the dorsal portion of his foot. These have sloughed off. The left plantar foot ulcer is about the same size, but has a much healthier surface than in the past. Both plantar ulcers have callus  and slough accumulation. Electronic Signature(s) Signed: 11/24/2021 1:34:52 PM By: [name] MD FACS Entered By: [name] on 11/24/2021 13:34:52 -------------------------------------------------------------------------------- Physical Exam Details Patient Name: Date of Service: [name], [name] 11/24/2021 12:45 PM Medical Record Number: [id number] Patient  Account Number: [id number] Date of Birth/Sex: Treating RN: October 20, 1985 (36 y.o. [name] Primary Care Provider: [name] Other Clinician: Referring Provider: Treating Provider/Extender: [name] in Treatment: 108 Constitutional Slightly hypertensive. Slightly tachycardic, asymptomatic. . . No acute distress.Marland Kitchen Respiratory Normal work of breathing on room air.. Notes 11/24/2021: Both of the dorsal foot/ankle wounds are substantially smaller. There is just a little eschar overlying the left sided wound and some eschar and slough on the right. The right plantar foot ulcer has the healthiest surface of granulation tissue that I have seen to date. A portion of the myriad matrix failed to take and was hanging loose. It appears that myriad morcells were placed into the tunnel closest to the dorsal portion of his foot. These have sloughed off. The left plantar foot ulcer is about the same size, but has a much healthier surface than in the past. Both plantar ulcers have callus and slough accumulation. Electronic Signature(s) Signed: 11/24/2021 1:37:11 PM By: [name] MD FACS Entered By: [name] on 11/24/2021 13:37:10 -------------------------------------------------------------------------------- Physician Orders Details Patient Name: Date of Service: [name], [name] 11/24/2021 12:45 PM Medical Record Number: [id number] Patient Account Number: [id number] Date of Birth/Sex: Treating RN: November 15, 1985 (36 y.o. 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 - Dr. [name] - Room 1 with [name] Return A Friday 12/03/21 @ 8:15 AM Bathing/ Shower/ Hygiene May shower and wash wound with soap and water. Edema Control - Lymphedema / SCD / Other Bilateral Lower Extremities Elevate legs to the level of the heart or above for 30 minutes daily and/or when sitting, a frequency of: - throughout the day Avoid standing for long periods of time. Exercise regularly Compression stocking or Garment 20-30 mm/Hg pressure to: - to both legs daily Off-Loading Open toe surgical shoe to: - Both feet Additional Orders / Instructions Follow Nutritious Diet Wound Treatment Wound #11 - Foot Wound Laterality: Right, Anterior Cleanser: Soap and Water 1 x Per Day/30 Days Discharge Instructions: May shower and wash wound with dial antibacterial soap and water prior to dressing change. Peri-Wound Care: Sween Lotion (Moisturizing lotion) 1 x Per Day/30 Days Discharge Instructions: Apply moisturizing lotion as directed Topical: keystone antibiotic compound 1 x Per Day/30 Days Prim Dressing: Promogran Prisma Matrix, 4.34 (sq in) (silver collagen) (DME) (Dispense As Written) 1 x Per Day/30 Days ary Discharge Instructions: Moisten collagen with saline or hydrogel Secondary Dressing: ABD Pad, 8x10 (DME) (Dispense As Written) 1 x Per Day/30 Days Discharge Instructions: Apply over primary dressing as directed. Secondary Dressing: Optifoam Non-Adhesive Dressing, 4x4 in (DME) (Dispense As Written) 1 x Per Day/30 Days Discharge Instructions: Apply over primary dressing as directed. Secondary Dressing: NonWoven Sponge, 4x4 (in/in) (Generic)  1 x Per Day/30 Days Secured With: 41M Medipore H Soft Cloth Surgical T ape, 4 x 10 (in/yd) (Generic)  1 x Per Day/30 Days Discharge Instructions: Secure with tape as directed. Compression Wrap: Kerlix Roll 4.5x3.1 (in/yd) 1 x Per Day/30 Days Discharge Instructions: Apply Kerlix and Coban compression as directed. Compression Wrap: 41M ACE Elastic Bandage With VELCRO Brand Closure, 4 (in) (DME) (Generic) 1 x Per Day/30 Days Wound #12 - Foot Wound Laterality: Dorsal, Left Cleanser: Soap and Water 1 x Per Day/30 Days Discharge Instructions: May shower and wash wound with dial antibacterial soap and water prior to dressing change. Peri-Wound Care: Sween Lotion (Moisturizing lotion) 1 x Per Day/30 Days Discharge Instructions: Apply moisturizing lotion as directed Topical: keystone antibiotic compound 1 x Per Day/30 Days Prim Dressing: Promogran Prisma Matrix, 4.34 (sq in) (silver collagen) (DME) (Dispense As Written) 1 x Per Day/30 Days ary Discharge Instructions: Moisten collagen with saline or hydrogel Secondary Dressing: ABD Pad, 8x10 (DME) (Dispense As Written) 1 x Per Day/30 Days Discharge Instructions: Apply over primary dressing as directed. Secondary Dressing: Optifoam Non-Adhesive Dressing, 4x4 in (DME) (Dispense As Written) 1 x Per Day/30 Days Discharge Instructions: Apply over primary dressing as directed. Secondary Dressing: NonWoven Sponge, 4x4 (in/in) (Generic) 1 x Per Day/30 Days Secured With: 41M Medipore H Soft Cloth Surgical T ape, 4 x 10 (in/yd) (Generic) 1 x Per Day/30 Days Discharge Instructions: Secure with tape as directed. Compression Wrap: Kerlix Roll 4.5x3.1 (in/yd) 1 x Per Day/30 Days Discharge Instructions: Apply Kerlix and Coban compression as directed. Compression Wrap: 41M ACE Elastic Bandage With VELCRO Brand Closure, 4 (in) (DME) (Generic) 1 x Per Day/30 Days Wound #3 - Foot Wound Laterality: Plantar, Left, Lateral Cleanser: Soap and Water 1 x Per Day/30 Days Discharge Instructions: May shower and wash wound with dial antibacterial soap and water prior to dressing  change. Peri-Wound Care: Sween Lotion (Moisturizing lotion) 1 x Per Day/30 Days Discharge Instructions: Apply moisturizing lotion as directed Topical: keystone antibiotic compound 1 x Per Day/30 Days Prim Dressing: Promogran Prisma Matrix, 4.34 (sq in) (silver collagen) (DME) (Dispense As Written) 1 x Per Day/30 Days ary Discharge Instructions: Moisten collagen with saline or hydrogel Secondary Dressing: ABD Pad, 8x10 (DME) (Dispense As Written) 1 x Per Day/30 Days Discharge Instructions: Apply over primary dressing as directed. Secondary Dressing: Optifoam Non-Adhesive Dressing, 4x4 in (DME) (Dispense As Written) 1 x Per Day/30 Days Discharge Instructions: Apply over primary dressing as directed. Secondary Dressing: NonWoven Sponge, 4x4 (in/in) (Generic) 1 x Per Day/30 Days Secured With: 41M Medipore H Soft Cloth Surgical T ape, 4 x 10 (in/yd) (Generic) 1 x Per Day/30 Days Discharge Instructions: Secure with tape as directed. Compression Wrap: Kerlix Roll 4.5x3.1 (in/yd) 1 x Per Day/30 Days Discharge Instructions: Apply Kerlix and Coban compression as directed. Compression Wrap: 41M ACE Elastic Bandage With VELCRO Brand Closure, 4 (in) (DME) (Generic) 1 x Per Day/30 Days Wound #8 - Foot Wound Laterality: Right, Lateral Cleanser: Soap and Water 1 x Per Day/30 Days Discharge Instructions: May shower and wash wound with dial antibacterial soap and water prior to dressing change. Peri-Wound Care: Sween Lotion (Moisturizing lotion) 1 x Per Day/30 Days Discharge Instructions: Apply moisturizing lotion as directed Topical: keystone antibiotic compound 1 x Per Day/30 Days Prim Dressing: Promogran Prisma Matrix, 4.34 (sq in) (silver collagen) (DME) (Dispense As Written) 1 x Per Day/30 Days ary Discharge Instructions: Moisten collagen with saline or hydrogel Secondary Dressing: ABD Pad, 8x10 (DME) (Dispense As Written) 1 x  Per Day/30 Days Discharge Instructions: Apply over primary dressing as  directed. Secondary Dressing: Optifoam Non-Adhesive Dressing, 4x4 in (DME) (Dispense As Written) 1 x Per Day/30 Days Discharge Instructions: Apply over primary dressing as directed. Secondary Dressing: NonWoven Sponge, 4x4 (in/in) (Generic) 1 x Per Day/30 Days Secured With: 36M Medipore H Soft Cloth Surgical T ape, 4 x 10 (in/yd) (Generic) 1 x Per Day/30 Days Discharge Instructions: Secure with tape as directed. Compression Wrap: Kerlix Roll 4.5x3.1 (in/yd) 1 x Per Day/30 Days Discharge Instructions: Apply Kerlix and Coban compression as directed. Compression Wrap: 36M ACE Elastic Bandage With VELCRO Brand Closure, 4 (in) (DME) (Generic) 1 x Per Day/30 Days Electronic Signature(s) Signed: 11/24/2021 2:22:30 PM By: [name] MD FACS Entered By: [name] on 11/24/2021 13:37:42 -------------------------------------------------------------------------------- Problem List Details Patient Name: Date of Service: [name], [name] 11/24/2021 12:45 PM Medical Record Number: [id number] Patient Account Number: [id number] Date of Birth/Sex: Treating RN: 12/25/1985 (36 y.o. 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 08/10/2020 No Yes L97.322 Non-pressure chronic ulcer of left ankle with fat layer exposed 09/29/2021 No Yes Inactive Problems ICD-10 Code Description Active Date Inactive Date L97.518 Non-pressure chronic  ulcer of other part of right foot with other specified severity 07/14/2020 07/14/2020 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, right ankle and foot 10/24/2019 10/24/2019 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, left ankle and foot 10/24/2019 10/24/2019 B95.62 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 10/24/2019 10/24/2019 classified elsewhere Resolved Problems Electronic Signature(s) Signed: 11/24/2021 2:22:30 PM By: [name] MD FACS Signed: 11/26/2021 7:35:53 AM By: Blanche East RN Previous Signature: 11/24/2021 1:31:13 PM Version By: [name] MD FACS Entered By: Blanche East on 11/24/2021 14:00:05 -------------------------------------------------------------------------------- Progress Note Details Patient Name: Date of Service: [name], [name] 11/24/2021 12:45 PM Medical Record Number: [id number] Patient Account Number: [id number] Date of Birth/Sex: Treating RN: [birth year] (36 y.o. 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 He comes in for review of a wound over the plantar fifth metatarsal head extending into the lateral part of the foot. He was followed for this previously by his podiatrist Dr. Cornelius Moras. As the patient tells his story he went to see podiatry first for a swelling he developed on the lateral part of his fifth metatarsal  head in May. He states this was "open" by podiatry and the area closed. He was followed up in June and it was again opened callus removed and it closed promptly. There were plans being made for surgery on the fifth metatarsal head in June  however his blood sugar was apparently too high for anesthesia. Apparently the area was debrided and opened again in June and it is never closed since. Looking over the records from podiatry I am really not able to follow this. It was clear when he was first seen it was before 5/14 at that point he already had a wound. By 5/17 the ulcer was resolved. I do not see anything about a procedure. On 5/28 noted to have pre-ulcerative moderate keratosis. X-ray noted 1/5 contracted toe and tailor's bunion and metatarsal deformity. On a visit date on 09/28/2018 the dorsal part of the left foot it healed and resolved. There was concern about swelling in his lower extremity he was sent to the ER.. As far as I can tell he was seen in the ER on 7/12 with an ulcer on his left foot. A DVT rule out of the left leg was negative. I do not think I have complete records from podiatry but I am not able to verify the procedures this patient states he had. He states after the last procedure the wound has never closed although I am not able to follow this in the records I have from podiatry. He has not had a recent x-ray The patient has been using Neosporin on the wound. He is wearing a Darco shoe. He is still very active up on his foot working and exercising. Past medical history; type 2 diabetes ketosis-prone, leg swelling with a negative DVT study in July. Non-smoker ABI in our clinic was 0.85 on the left 10/16; substantial wound on the plantar left fifth met head extending laterally almost to the dorsal fifth MTP. We have been using silver alginate we gave him a Darco forefoot off loader. An x-ray did not show evidence of osteomyelitis did note soft tissue emphysema which I think was due  to gas tracking through an open wound. There is no doubt in my mind he requires an MRI 10/23; MRI not booked until 3 November at the earliest this is largely due to his glucose sensor in the right arm. We have been using silver alginate. There has been an improvement 10/29; I am still not exactly sure when his MRI is booked for. He says it is the third but it is the 10th in epic. This definitely needs to be done. He is running a low-grade fever today but no other symptoms. No real improvement in the 1 02/26/2019 patient presents today for a follow-up visit here in our clinic he is last been seen in the clinic on October 29. Subsequently we were working on getting MRI to evaluate and see what exactly was going on and where we would need to go from the standpoint of whether or not he had osteomyelitis and again what treatments were going be required. Subsequently the patient ended up being admitted to the hospital on 02/07/2019 and was discharged on 02/14/2019. This is a somewhat interesting admission with a discharge diagnosis of pneumonia due to COVID-19 although he was positive for COVID-19 when tested at the urgent care but negative x2 when he was actually in the hospital. With that being said he did have acute respiratory failure with hypoxia and it was noted he also have a left foot ulceration with osteomyelitis. With that being said he did require oxygen for his pneumonia and I level 4 L. He was placed on antivirals and steroids for the COVID-19. He was also transferred to the [facility] at one point. Nonetheless he  did subsequently discharged home and since being home has done much better in that regard. The CT angiogram did not show any pulmonary embolism. With regard to the osteomyelitis the patient was placed on vancomycin and Zosyn while in the hospital but has been changed to Augmentin at discharge. It was also recommended that he follow- up with wound care and podiatry. Podiatry  however wanted him to see [name] according to the patient prior to them doing anything further. His hemoglobin A1c was 9.9 as noted in the hospital. Have an MRI of the left foot performed while in the hospital on 02/04/2019. 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 There is an overlying plantar open wound noted an abscess tracking back along the lateral aspect of the fifth metatarsal shaft. There is otherwise diffuse cellulitis and mild fasciitis without findings of polymyositis. The patient did have recently pneumonia secondary to COVID-19 I looked in the chart through epic and it does appear that the patient may need to have an additional x-ray just to ensure everything is cleared and that he has no airspace disease prior to putting him into the [name]. 03/05/2019; patient was readmitted to the clinic last week. He was hospitalized twice for a viral upper respiratory tract infection from 11/1 through 11/4 and then 11/5 through 11/12 ultimately this turned out to be Covid pneumonitis. Although he was discharged on oxygen he is not using it. He says he feels fine. He has no exercise limitation no cough no sputum. His O2 sat in our clinic today was 100% on room air. 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 He received Vanco and Zosyn in the hospital and then was discharged on 2 weeks of Augmentin. I do not see any relevant cultures. He was supposed to follow-up with infectious disease but I do not see that he has an appointment. 12/8; patient saw Dr. Novella Olive of infectious disease last week. He felt that he had had adequate antibiotic therapy. He did not go to follow-up with Dr. Amalia Hailey of podiatry and I have again talked to him about the pros and cons of this. He does not want to consider a ray  amputation of this time. He is aware of the risks of recurrence, migration etc. He started HBO today and tolerated this well. He can complete the Augmentin that I gave him last week. I have looked over the lab work that Dr. Chana Bode ordered his C-reactive protein was 3.3 and his sedimentation rate was 17. The C-reactive protein is never really been measurably that high in this patient 12/15; not much change in the wound today however he has undermining along the lateral part of the foot again more extensively than last week. He has some rims of epithelialization. We have been using silver alginate. He is undergoing hyperbarics but did not dive today 12/18; in for his obligatory first total contact cast change. Unfortunately there was pus coming from the undermining area around his fifth metatarsal head. This was cultured but will preclude reapplication of a cast. He is seen in conjunction with HBO 12/24; patient had staph lugdunensis in the wound in the undermining area laterally last time. We put him on doxycycline which should have covered this. The wound looks better today. I am going to give him another week of doxycycline before reattempting  the total contact cast 12/31; the patient is completing antibiotics. Hemorrhagic debris in the distal part of the wound with some undermining distally. He also had hyper granulation. Extensive debridement with a #5 curette. The infected area that was on the lateral part of the fifth met head is closed over. I do not think he needs any more antibiotics. Patient was seen prior to HBO. Preparations for a total contact cast were made in the cast will be placed post hyperbarics 04/11/19; once again the patient arrives today without complaint. He had been in a cast all week noted that he had heavy drainage this week. This resulted in large raised areas of macerated tissue around the wound 1/14; wound bed looks better slightly smaller. Hydrofera Blue has been changing  himself. He had a heavy drainage last week which caused a lot of maceration around the wound so I took him out of a total contact cast he says the drainage is actually better this week He is seen today in conjunction with HBO 1/21; returns to clinic. He was up in [place] for a day or 2 attending a funeral. He comes back in with the wound larger and with a large area of exposed bone. He had osteomyelitis and septic arthritis of the fifth left metatarsal head while he was in hospital. He received IV antibiotics in the hospital for a prolonged period of time then 3 weeks of Augmentin. Subsequently I gave him 2 weeks of doxycycline for more superficial wound infection. When I saw this last week the wound was smaller the surface of the wound looks satisfactory. 1/28; patient missed hyperbarics today. Bone biopsy I did last time showed Enterococcus faecalis and Staphylococcus lugdunensis . He has a wide area of exposed bone. We are going to use silver alginate as of today. I had another ethical discussion with the patient. 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 In this situation I think the likelihood of healing this is low. Therefore I have recommended a ray amputation and with the patient's agreement I have referred him to Dr. Doran Durand. The other issue is that his compliance with hyperbarics has been minimal because of his work schedule and given his underlying decision I am going to stop this today READMISSION 10/24/2019 MRI 09/29/2019 left foot IMPRESSION: 1. Apparent skin ulceration inferior and lateral to the 5th metatarsal base with underlying heterogeneous T2 signal and enhancement in the subcutaneous fat. Small peripherally enhancing fluid collections along the plantar and lateral aspects of the 5th metatarsal base suspicious for abscesses. 2. Interval amputation through the mid 5th metatarsal with nonspecific low-level marrow edema and enhancement. Given  the proximity to the adjacent soft tissue inflammatory changes, osteomyelitis cannot be excluded. 3. The additional bones appear unremarkable. MRI 09/29/2019 right foot IMPRESSION: 1. Soft tissue ulceration lateral to the 5th MTP joint. There is low-level T2 hyperintensity within the 4th and 5th metatarsal heads and adjacent proximal phalanges without abnormal T1 signal or cortical destruction. These findings are nonspecific and could be seen with early marrow edema, hyperemia or early osteomyelitis. No evidence of septic joint. 2. Mild tenosynovitis and synovial enhancement associated with the extensor digitorum tendons at the level of the midfoot. 3. Diffuse low-level muscular T2 hyperintensity and enhancement, most consistent with diabetic myopathy. LEFT FOOT BONE Methicillin resistant staphylococcus aureus Staphylococcus lugdunensis MIC MIC CIPROFLOXACIN >=8 RESISTANT Resistant <=0.5 SENSI... Sensitive CLINDAMYCIN <=0.25 SENS... Sensitive >=8 RESISTANT Resistant ERYTHROMYCIN >=8 RESISTANT Resistant >=8 RESISTANT Resistant GENTAMICIN <=0.5 SENSI... Sensitive <=  0.5 SENSI... Sensitive Inducible Clindamycin NEGATIVE Sensitive NEGATIVE Sensitive OXACILLIN >=4 RESISTANT Resistant 2 SENSITIVE Sensitive RIFAMPIN <=0.5 SENSI... Sensitive <=0.5 SENSI... Sensitive TETRACYCLINE <=1 SENSITIVE Sensitive <=1 SENSITIVE Sensitive TRIMETH/SULFA <=10 SENSIT Sensitive <=10 SENSIT Sensitive ... Marland Kitchen.. VANCOMYCIN 1 SENSITIVE Sensitive <=0.5 SENSI... Sensitive Right foot bone . Component 3 wk ago Specimen Description BONE Special Requests RIGHT 4 METATARSAL SAMPLE B Gram Stain NO WBC SEEN NO ORGANISMS SEEN Culture RARE METHICILLIN RESISTANT STAPHYLOCOCCUS AUREUS NO ANAEROBES ISOLATED Performed at [facility], [place] [address] [place], [place] [address] Report Status 10/08/2019 FINAL Organism ID, Bacteria METHICILLIN RESISTANT STAPHYLOCOCCUS AUREUS Resulting Agency [facility] Susceptibility Methicillin resistant staphylococcus aureus MIC CIPROFLOXACIN >=8 RESISTANT Resistant CLINDAMYCIN <=0.25 SENS... Sensitive ERYTHROMYCIN >=8 RESISTANT Resistant GENTAMICIN <=0.5 SENSI... Sensitive Inducible Clindamycin NEGATIVE Sensitive OXACILLIN >=4 RESISTANT Resistant RIFAMPIN <=0.5 SENSI... Sensitive TETRACYCLINE <=1 SENSITIVE Sensitive TRIMETH/SULFA <=10 SENSIT Sensitive ... VANCOMYCIN 1 SENSITIVE Sensitive This is a patient we had in clinic earlier this year with a wound over his left fifth metatarsal head. 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 Doran Durand on 05/16/2019. He seems to have developed problems with wounds on his bilateral feet in June 2021 although he may have had problems earlier than this. He was in an urgent care with a right foot ulcer on 09/26/2019 and given a course of doxycycline. This was apparently after having trouble getting into see orthopedics. He was seen by podiatry on 09/28/2019 noted to have bilateral lower extremity ulcers including the left lateral fifth metatarsal base and the right subfifth met head. It was noted that had purulent drainage at that time. He required hospitalization from 6/20 through 7/2. This was because of worsening right foot wounds. He underwent bilateral operative incision and drainage and bone biopsies bilaterally. Culture results are listed above. He has been referred back to clinic by Dr. Jacqualyn Posey of podiatry. He is also followed by Dr. Megan Salon who saw him yesterday. He was discharged from hospital on Zyvox Flagyl and Levaquin and yesterday changed to doxycycline Flagyl and Levaquin. His inflammatory markers on 6/26 showed a  sedimentation rate of 129 and a C-reactive protein of 5. This is improved to 14 and 1.3 respectively. This would indicate improvement. ABIs in our clinic today were 1.23 on the right and 1.20 on the left 11/01/2019 on evaluation today patient appears to be doing fairly well in regard to the wounds on his feet at this point. Fortunately there is no signs of active infection at this time. No fevers, chills, nausea, vomiting, or diarrhea. He currently is seeing infectious disease and still under their care at this point. Subsequently he also has both wounds which she has not been using collagen on as he did not receive that in his packaging he did not call us and let us know that. Apparently that just was missed on the order. Nonetheless we will get that straightened out today. 8/9-Patient returns for bilateral foot wounds, using Prisma with hydrogel moistened dressings, and the wounds appear stable. Patient using surgical shoes, avoiding much pressure or weightbearing as much as possible 8/16; patient has bilateral foot wounds. 1 on the right lateral foot proximally the other is on the left mid lateral foot. Both required debridement of callus and  thick skin around the wounds. We have been using silver collagen 8/27; patient has bilateral lateral foot wounds. The area on the left substantially surrounded by callus and dry skin. This was removed from the wound edge. The underlying wound is small. The area on the right measured somewhat smaller today. 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 Unfortunately I did not update his antibiotics during today's visit. 9/10 I reviewed Dr. Hale Bogus last notes he felt he had completed antibiotics his inflammatory markers were reasonably well controlled. He has a small wound on the lateral left foot and a tiny area on the right which is just above closed. He is using Hydrofera Blue with border foam  he has bilateral surgical shoes 9/24; 2 week f/u. doing well. right foot is closed. left foot still undermined. 10/14; right foot remains closed at the fifth met head. The area over the base of the left fifth metatarsal has a small open area but considerable undermining towards the plantar foot. Thick callus skin around this suggests an adequate pressure relief. We have talked about this. He says he is going to go back into his cam boot. I suggested a total contact cast he did not seem enamored with this suggestion 10/26; left foot base of the fifth metatarsal. Same condition as last time. He has skin over the area with an open wound however the skin is not adherent. He went to see Dr. Earleen Newport who did an x-ray and culture of his foot I have not reviewed the x-ray but the patient was not told anything. He is on doxycycline 11/11; since the patient was last here he was in the emergency room on 10/30 he was concerned about swelling in the left foot. They did not do any cultures or x-rays. They changed his antibiotics to cephalexin. Previous culture showed group B strep. The cephalexin is appropriate as doxycycline has less than predictable coverage. Arrives in clinic today with swelling over this area under the wound. He also has a new wound on the right fifth metatarsal head 11/18; the patient has a difficult wound on the lateral aspect of the left fifth metatarsal head. The wound was almost ballotable last week I opened it slightly expecting to see purulence however there was just bleeding. I cultured this this was negative. X-ray unchanged. We are trying to get an MRI but I am not sure were going to be able to get this through his insurance. He also has an area on the right lateral fifth metatarsal head this looks healthier 12/3; the patient finally got our MRI. Surprisingly this did not show osteomyelitis. I did show the soft tissue ulceration at the lateral plantar aspect of the fifth metatarsal base  with a tiny residual 6 mm abscess overlying the superficial fascia I have tried to culture this area I have not been able to get this to grow anything. Nevertheless the protruding tissue looks aggravated. I suspect we should try to treat the underlying "abscess with broad-spectrum antibiotics. I am going to start him on Levaquin and Flagyl. He has much less edema in his legs and I am going to continue to wrap his legs and see him weekly 12/10. I started Levaquin and Flagyl on him last week. He just picked up the Flagyl apparently there was some delay. The worry is the wound on the left fifth metatarsal base which is substantial and worsening. His foot looks like he inverts at the  ankle making this a weightbearing surface. Certainly no improvement in fact I think the measurements of this are somewhat worse. We have been using 12/17; he apparently just got the Levaquin yesterday this is 2 weeks after the fact. He has completed the Flagyl. The area over the left fifth metatarsal base still has protruding granulation tissue although it does not look quite as bad as it did some weeks ago. He has severe bilateral lymphedema although we have not been treating him for wounds on his legs this is definitely going to require compression. There was so much edema in the left I did not wish to put him in a total contact cast today. I am going to increase his compression from 3-4 layer. The area on the right lateral fifth met head actually look quite good and superficial. 12/23; patient arrived with callus on the right fifth met head and the substantial hyper granulated callused wound on the base of his fifth metatarsal. He says he is completing his Levaquin in 2 days but I do not think that adds up with what I gave him but I will have to double check this. We are using Hydrofera Blue on both areas. My plan is to put the left leg in a cast the week after New Year's 04/06/2020; patient's wounds about the same. Right  lateral fifth metatarsal head and left lateral foot over the base of the fifth metatarsal. There is undermining on the left lateral foot which I removed before application of total contact cast continuing with Hydrofera Blue new. Patient tells me he was seen by endocrinology today lab work was done [Dr. Kerr]. Also wondering whether he was referred to cardiology. I went over some lab work from previously does not have chronic renal failure certainly not nephrotic range proteinuria he does have very poorly controlled diabetes but this is not his most updated lab work. Hemoglobin A1c has been over 11 1/10; the patient had a considerable amount of leakage towards mid part of his left foot with macerated skin however the wound surface looks better the area on the right lateral fifth met head is better as well. I am going to change the dressing on the left foot under the total contact cast to silver alginate, continue with Hydrofera Blue on the right. 1/20; patient was in the total contact cast for 10 days. Considerable amount of drainage although the skin around the wound does not look too bad on the left foot. The area on the right fifth metatarsal head is closed. Our nursing staff reports large amount of drainage out of the left lateral foot wound 1/25; continues with copious amounts of drainage described by our intake staff. PCR culture I did last week showed E. coli and Enterococcus faecalis and low quantities. Multiple resistance genes documented including extended spectrum beta lactamase, MRSA, MRSE, quinolone, tetracycline. The wound is not quite as good this week as it was 5 days ago but about the same size 2/3; continues with copious amounts of malodorous drainage per our intake nurse. The PCR culture I did 2 weeks ago showed E. coli and low quantities of Enterococcus. There were multiple resistance genes detected. I put Neosporin on him last week although this does not seem to have helped. The  wound is slightly deeper today. Offloading continues to be an issue here although with the amount of drainage she has a total contact cast is just not going to work 2/10; moderate amount of drainage. Patient reports he cannot get  his stocking on over the dressing. I told him we have to do that the nurse gave him suggestions on how to make this work. The wound is on the bottom and lateral part of his left foot. Is cultured predominantly grew low amounts of Enterococcus, E. coli and anaerobes. There were multiple resistance genes detected including extended spectrum beta lactamase, quinolone, tetracycline. I could not think of an easy oral combination to address this so for now I am going to do topical antibiotics provided by [facility] I think the main agents here are vancomycin and an aminoglycoside. We have to be able to give him access to the wounds to get the topical antibiotic on 2/17; moderate amount of drainage this is unchanged. He has his Keystone topical antibiotic against the deep tissue culture organisms. He has been using this and changing the dressing daily. Silver alginate on the wound surface. 2/24; using Keystone antibiotic with silver alginate on the top. He had too much drainage for a total contact cast at one point although I think that is improving and I think in the next week or 2 it might be possible to replace a total contact cast I did not do this today. In general the wound surface looks healthy however he continues to have thick rims of skin and subcutaneous tissue around the wide area of the circumference which I debrided 06/04/2020 upon evaluation today patient appears to be doing well in regard to his wound. I do feel like he is showing signs of improvement. There is little bit of callus and dead tissue around the edges of the wound as well as what appears to be a little bit of a sinus tract that is off to the side laterally I would perform debridement to clear that away  today. 3/17; left lateral foot. The wound looks about the same as I remember. Not much depth surface looks healthy. No evidence of infection 3/25; left lateral foot. Wound surface looks about the same. Separating epithelium from the circumference. There really is no evidence of infection here however not making progress by my view 3/29; left lateral foot. Surface of the wound again looks reasonably healthy still thick skin and subcutaneous tissue around the wound margins. There is no evidence of infection. One of the concerns being brought up by the nurses has again the amount of drainage vis--vis continued use of a total contact cast 4/5; left lateral foot at roughly the base of the fifth metatarsal. Nice healthy looking granulated tissue with rims of epithelialization. The overall wound measurements are not any better but the tissue looks healthy. The only concern is the amount of drainage although he has no surrounding maceration with what we have been doing recently to absorb fluid and protect his skin. He also has lymphedema. 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 He states that the kerlix/coban under the TCC rolled down and could not get it back up. He has been using an offloading scooter and has somehow hurt his right foot using this device. This happened last week. He states that the side of his right foot developed a blister and opened. The top of his foot also has a few small open wounds he thinks is due to  his socks rubbing in his shoes. He has not been using any dressings to the wound. He denies purulent drainage, fever/chills or erythema to the wounds. 4/22; patient presents for 1 week follow-up. He developed new wounds to the right foot  that were evaluated at last clinic visit. He continues to have a total contact cast to the left leg and he reports no issues. He has been using silver collagen to the right foot wounds with no issues. He denies purulent drainage, fever/chills or erythema to the right foot wounds. He has no complaints today 4/25; patient presents for 1 week follow-up. He has a total contact cast of the left leg and reports no issues. He has been using silver alginate to the right foot wound. He denies purulent drainage, fever/chills or erythema to the right foot wounds. 5/2 patient presents for 1 week follow-up. T contact cast on the left. The wound which is on the base of the plantar foot at the base of the fifth metatarsal otal actually looks quite good and dimensions continue to gradually contract. HOWEVER the area on the right lateral fifth metatarsal head is much larger than what I remember from 2 weeks ago. Once more is he has significant levels of hypergranulation. Noteworthy that he had this same hyper granulated response on his wound on the left foot at one point in time. So much so that he I thought there was an underlying fluid collection. Based on this I think this just needs debridement. 5/9; the wound on the left actually continues to be gradually smaller with a healthy surface. Slight amount of drainage and maceration of the skin around but not too bad. However he has a large wound over the right fifth metatarsal head very much in the same configuration as his left foot wound was initially. I used silver nitrate to address the hyper granulated tissue no mechanical debridement 5/16; area on the left foot did not look as healthy this week deeper thick surrounding macerated skin and subcutaneous tissue. oo The area on the right foot fifth met head was about the same oo The area on the right ankle that we identified last week is completely broken down into an open wound presumably a stocking rubbing  issue 5/23; patient has been using a total contact cast to the left side. He has been using silver alginate underneath. He has also been using silver alginate to the right foot wounds. He has no complaints today. He denies any signs of infection. 5/31; the left-sided wound looks some better measure smaller surface granulation looks better. We have been using silver alginate under the total contact cast oo The large area on his right fifth met head and right dorsal foot look about the same still using silver alginate 6/6; neither side is good as I was hoping although the surface area dimensions are better. A lot of maceration on his left and right foot around the wound edge. Area on the dorsal right foot looks better. He says he was traveling. I am not sure what does the amount of maceration around the plantar wounds may be drainage issues 6/13; in general the wound surfaces look quite good on both sides. Macerated skin and raised edges around the wound required debridement although in general especially on the left the surface area seems improved. oo The area on the right dorsal ankle is about the same I thought this would not be such a problem to close 6/20; not much change in either wound although  the one on the right looks a little better. Both wounds have thick macerated edges to the skin requiring debridements. We have been using silver alginate. The area on the dorsal right ankle is still open I thought this would be closed. 6/28; patient comes in today with a marked deterioration in the right foot wound fifth met head. Wide area of exposed bone this is a drastic change from last time. The area on the left there we have been casting is stagnant. We have been using silver alginate in both wound areas. 7/5; bone culture I did for PCR last time was positive for Pseudomonas, group B strep, Enterococcus and Staph aureus. There was no suggestion of methicillin resistance or ampicillin resistant  genes. This was resistant to tetracycline however He comes into the clinic today with the area over his right plantar fifth metatarsal head which had been doing so well 2 weeks ago completely necrotic feeling bone. I do not know that this is going to be salvageable. The left foot wound is certainly no smaller but it has a better surface and is superficial. 7/8; patient called in this morning to say that his total contact cast was rubbing against his foot. He states he is doing fine overall. He denies signs of infection. 7/12; continued deterioration in the wound over the right fifth metatarsal head crumbling bone. This is not going to be salvageable. The patient agrees and wants to be referred to Dr. Doran Durand which we will attempt to arrange as soon as possible. I am going to continue him on antibiotics as long as that takes so I will renew those today. The area on the left foot which is the base of the fifth metatarsal continues to look somewhat better. Healthy looking tissue no depth no debridement is necessary here. 7/20; the patient was kindly seen by Dr. Doran Durand of orthopedics on 10/19/2020. 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 Towards this end we have taken him out of the total contact cast on the left we will put him in a wrap with Hydrofera Blue. As I understand things surgery is planned for 7/21 7/27; patient had his surgery last Thursday. 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 Apparently everything went well we did not still disturb that today The area on the left foot actually looks quite good. He has been much less mobile which probably explains this he did not seem to do well in the total contact cast secondary to drainage and maceration I think. We have been using Hydrofera Blue 11/09/2020 upon evaluation today patient appears to be doing well with regard to his plantar foot ulcer on the left  foot. Fortunately there is no evidence of active infection at this time. No fevers, chills, nausea, vomiting, or diarrhea. Overall I think that he is actually doing extremely well. Nonetheless I do believe that he is staying off of this more following the surgery in his right foot that is the reason the left is doing so great. 8/16; left plantar foot wound. This looks smaller than the last time I saw this he is using Hydrofera Blue. The surgical wound on the right foot is being followed by Dr. Doran Durand we did not look at this today. He has surgical shoes on both feet 8/23; left plantar foot wound not as good this week. Surrounding macerated skin and subcutaneous tissue everything looks moist  and wet. I do not think he is offloading this adequately. He is using a surgical shoe Apparently the right foot surgical wound is not open although I did not check his foot 8/31; left plantar foot lateral aspect. Much improved this week. He has no maceration. Some improvement in the surface area of the wound but most impressively the depth is come in we are using silver alginate. The patient is a Product/process development scientist. He is asked that we write him a letter so he can go back to work. I have also tried to see if we can write something that will allow him to limit the amount of time that he is on his foot at work. 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 Hopefully the latter can be avoided 9/6; I believe the patient missed an appointment last week. He arrives in today with a wound looking roughly the same certainly no better. Undermining laterally and also inferiorly. We used [employer]cuLight today in training with the patient's permission.. We are using silver alginate 9/21 wound is measuring bigger this week although this may have to do with the aggressive circumferential debridement last week in response to the blush fluorescence on the  MolecuLight. Culture I did last week showed significant MSSA and E. coli. I put him on Augmentin but he has not started it yet. We are also going to send this for compounded antibiotics at [facility]. There is no evidence of systemic infection 9/29; silver alginate. His Keystone arrived. He is completing Augmentin in 2 days. Offloading in a cam boot. Moderate drainage per our intake staff 10/5; using silver alginate. He has been using his [place]. He has completed his Augmentin. Per our intake nurse still a lot of drainage, far too much to consider a total contact cast. Wound measures about the same. He had the same undermining area that I defined last week from a roughly 11-3. I remove this today 10/12; using silver alginate he is using the [place]. He comes in for a nurse visit hence we are applying [employer] twice a week. Measuring slightly better today and less notable drainage. Extensive debridement of the wound edge last time 10/18; using topical Keystone and silver alginate and a soft cast. Wound measurements about the same. Drainage was through his soft cast. We are changing this twice a week Tuesdays and Friday 10/25; comes in with moderate drainage. Still using Keystone silver alginate and a soft cast. Wound dimensions completely the same.He has a lot of edema in the left leg he has lymphedema. Asking for [name] to consider wrapping him as he cannot get his stocking on over the soft cast 11/2; comes in with moderate to large drainage slightly smaller in terms of width we have been using [place]. His wound looks satisfactory but not much improvement 11/4; patient presents today for obligatory cast change. Has no issues or complaints today. He denies signs of infection. 11/9; patient traveled this weekend to [place], was on the cast quite a bit. Staining of the cast with black material from his walking boot. Drainage was not quite as bad as we feared. Using silver alginate and Keystone 11/16; we do  not have size for cast therefore we have been putting a soft cast on him since the change on Friday. Still a significant amount of drainage necessitating changing twice a week. We have been using the [facility] at cast changes either hard  or soft as well as silver alginate Comes in the clinic with things actually looking fairly good improvement in width. He says his offloading is about the same 02/24/2021 upon evaluation today patient actually comes back in and is doing excellent in regard to his foot ulcer this is significantly smaller even compared to the last visit. The soft cast seems to have done extremely well for him which is great news. I do not see any signs of infection minimal debridement will be needed today. 11/30; left lateral foot much improved half a centimeter improvement in surface area. No evidence of infection. He seems to be doing better with the soft cast in the TCC therefore we will continue with this. He comes back in later in the week for a change with the nurses. This is due to drainage 12/6; no improvement in dimensions. Under illumination some debris on the surface we have been using silver alginate, soft cast. If there is anything optimistic here he seems to have have less drainage 12/13. Dimensions are improved both length and width and slightly in depth. Appears to be quite healthy today. Raised edges of this thick skin and callus around the edges however. He is in a soft cast were bringing him back once for a change on Friday. Drainage is better 12/20. Dimensions are improved. He still has raised edges of thick skin and callus around the edges. We are using a soft cast 12/28; comes in today with thick callus around the wound. Using silver under alginate under a soft cast. I do not think there is much improvement in any measurement 2023 04/06/2021; patient was put in a total contact cast. Unfortunately not much change in surface area 1/10; not much different still thick  callus and skin around the edge in spite of the total contact cast. This was just debrided last week we have been using the [facility] compounded antibiotic and silver alginate under a total contact cast 1/18 the patient's wound on the left side is doing nicely. smaller HOWEVER he comes in today with a wound on the right foot laterally. blister most likely serosangquenous drainage 1/24; the patient continues to do well in terms of the plantar left foot which is continued to contract using silver alginate under the total contact cast HOWEVER the right lateral foot is bigger with denuded skin around the edges. I used pickups and a #15 scalpel to remove this this looks like the remanence of a large blister. Cannot rule out infection. Culture in this area I did last week showed Staphylococcus lugdunensis few colonies. I am going to try to address this with his [employer] antibiotic that is done so well on the left having linezolid and this should cover the staph 2/1; the patient's wound on his left foot which was the original plantar foot wound thick skin and eschar around the edges even in the total contact cast but the wound surface does not look too bad The real problem is on how his right lateral foot at roughly the base of the fifth metatarsal. The wound is completely necrotic more worrisome than that there is swelling around the edges of this. We have been using silver alginate on both wounds and Keystone on the right foot. Unfortunately I think he is going to require systemic antibiotics while we await cultures. He did not get the x-ray done that we ordered last week [lost the prescription 2/7; disappointingly in the area on the left foot which we are treating with a total  contact cast is still not closed although it is much smaller. He continues to have a lot of callus around the wound edge. -Right lateral foot culture I did last week was negative x-ray also negative for osteomyelitis. 2/15: TCC  silver alginate on the left and silver alginate on the right lateral. No real improvement in either area 05/26/2021: T oday, the wounds are roughly the same size as at his previous visit, post-debridement. He continues to endorse fairly substantial drainage, particularly on the right. He has been in a total contact cast on the left. There is still some callus surrounding this lesion. On the right, the periwound skin is quite macerated, along with surrounding callus. The center of the right-sided wound also has some dark, densely adherent material, which is very difficult to remove. 06/02/2021: Today, both wounds are slightly smaller. He has been using zinc oxide ointment around the right ulcer and the degree of maceration has improved markedly. There continues to be an area of nonviable tissue in the center of the right sided ulcer. The left-sided wound, which has been in the total contact cast. Appears clean and the degree of callus around it is less than previously. 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 He had to take the stairs and both wounds have increased in size. The left foot, which has been in a total contact cast, has developed a tunnel tracking to the lateral aspect of his foot. The nonviable tissue in the center of the right-sided ulcer remains recalcitrant to debridement. There is significant undermining surrounding the entirety of the left sided wound. 06/16/2021: The elevator at school has been fixed and the patient has been able to avoid putting as much weight on his wounds over the past week. We converted the left foot wound into a single lesion today, but despite this, the wound is actually smaller. The base is healthy with limited periwound callus. On the right, the central necrotic area is still present. He continues to be quite macerated around the right sided wound, despite applying barrier cream. This  does, however, have the benefit of softening the callus to make it more easily removable. 06/23/2021: Today, the left wound is smaller. The lateral aspect that had opened up previously is now closed. The wound base has a healthy bed of granulation tissue and minimal slough. Unfortunately, on the right, the wound is larger and continues to be fairly macerated. He has also reopened the wound at his right ankle. He thinks this is due to the gait he has adopted secondary to his total contact cast and boot. 06/30/2021: T oday, both wounds are a little bit larger. The lateral aspect on the left has remained closed. He continues to have significant periwound maceration. The culture that I took from the right sided wound grew a population of bacteria that is not covered by his current [facility] antibiotic. The center of the right- sided wound continues to appear necrotic with nonviable fat. It probes deeper today, but does not reach bone. 07/07/2021: The periwound maceration is a little bit less today. The right lateral foot wound has some areas that appear more viable and the necrotic center also looks a little bit better. The wound on the dorsal surface of his right foot near the ankle is contracting and the surface appears healthy. The left plantar wound surface looks healthy, but there is some new undermining on the medial portion. He did get his new  Keystone antibiotic and began applying that to the right foot wound on Saturday. 07/14/2021: The intake nurse reported substantial drainage from his wounds, but the periwound skin actually looks better than is typical for him. The wound on the dorsal surface of his right foot near the ankle is smaller and just has a small open area underneath some dried eschar. The left plantar wound surface looks healthy and there has been no significant accumulation of callus. The right lateral foot wound looks quite a bit better, with the central portion, which typically appears  necrotic, looking more viable albeit pale. 07/22/2021: His left foot is extremely macerated today. The wound is about the same size. The wound on the dorsal surface of his right foot near the ankle had closed, but he traumatized it removing the dressing and there is a tiny skin tear in that location. The right lateral foot wound is bigger, but the surface appears healthy. 07/30/2021: The wound on the dorsal surface of his right foot near the ankle is closed. The right lateral foot wound again is a little bit bigger due to some undermining. The periwound skin is in better condition, however. He has been applying zinc oxide. The wound surface is a little bit dry today. On the left, he does not have the substantial maceration that we frequently see. The wound itself is smaller and has a clean surface. 08/06/2021: Both wounds seem to have deteriorated over the past week. The right lateral foot wound has a dry surface but the periwound is boggy.. Overall wound dimensions are about the same. On the left, the wound is about the same size, but there is more undermining present underneath periwound callus. 08/13/2021: The right sided wound looks about the same, but on the left there has been substantial deterioration. The undermining continues to extend under periwound callus. Once this was removed, substantial extension of the wound was present. There is no odor or purulent drainage but clearly the wounds have broken down. 08/20/2021: The wounds look about the same today. He has been out of his total contact cast and has just been changing the dressings using topical Keystone with PolyMem Ag, Kerlix and Ace bandages. The wound on the top of his right ankle has reopened but this is quite small. There was a little bit of purulent material that I expressed when examining this wound. 08/24/2021: After the aggressive debridement I performed at his last visit, the wounds actually look a little bit better today. They are  smaller with the exception of the wound on the top of his right ankle which is a little bit bigger as some more skin pulled off when he was changing his dressing. We are using topical Keystone with PolyMem Ag Kerlix and Ace bandages. 09/02/2021: There has been really no change to any of his wounds. 09/16/2021: The patient was hospitalized last week with nausea, vomiting, and dehydration. He says that while he was in the hospital, his wounds were not really addressed properly. T oday, both plantar foot wounds are larger and the periwound skin is macerated. The wound on the dorsum of his right foot has a scab on the top. The right foot now has a crater where previously he had had nonviable fat. It looks as though this simply died and fell out. The periwound callus is wet. 09/24/2021: His wounds have deteriorated somewhat since his last visit. The wound on the dorsum of his right foot near his ankle is larger and has more nonviable tissue present.  The crater in his right foot is even deeper; I cannot quite palpate or probe to bone but I am sure it is close. The wound on his left plantar foot has an odd boggy area in the center that almost feels as though it has fluid within it. He has run out of his topical Keystone antibiotic. We are using silver alginate on his wounds. 09/29/2021: He has developed a new wound on the dorsum of his left foot near his ankle. He says he thinks his wrapping is rubbing in that site. I would concur with this as the wound on his right ankle is larger. The left foot looks about the same. The right foot has the crater that was present last week. No significant slough accumulation, but his foot remains quite swollen and warm despite oral antibiotic therapy. 10/08/2021: All of his wounds look about the same as last week. He did not start his oral antibiotics that are prescribed until just a couple of days ago; his [employer] compounded antibiotics formula has been changed and he is  awaiting delivery of the new recipe. His MRI that was scheduled for earlier this week was canceled as no prior authorization had been obtained; unfortunately the tech responsible sent an email to my old [place] email, which I no longer use nor have access to. 10/18/2021: The wounds on his bilateral dorsal feet near the ankles are both improved. They are smaller and have just some eschar and slough buildup. The left plantar wound has a fair amount of undermining, but the surface is clean. There is some periwound callus accumulation. On the right plantar foot, there is nonviable fat leading to a deep tunnel that tracks towards his dorsal medial foot. There is periwound callus and slough accumulation, as well. His right foot and leg remain swollen as compared to the left. 10/25/2021: The wounds on his bilateral dorsal feet and at the ankles have broken down somewhat. They are little bit larger than last week. The left plantar wound continues to undermine laterally but the surface is clean. The right plantar foot wound shows some decreased depth in the tunnel tracking towards his dorsal medial foot. He has not yet had the Doppler study that I ordered; it sounds like there is some confusion about the scheduling of the procedure. In addition, the MRI was denied and I have taken steps to appeal the denial. 11/24/2021: Since our last visit, Mr. Santucci was admitted to the hospital where an MRI suggested osteomyelitis. He was taken the operating room by podiatry. Bone biopsies were negative for osteomyelitis. They debrided his wounds and applied myriad matrix. He saw them last week and they removed his staples. He is here today to continue his wound healing process. T oday, both of the dorsal foot/ankle wounds are substantially smaller. There is just a little eschar overlying the left sided wound and some eschar and slough on the right. The right plantar foot ulcer has the healthiest surface of granulation  tissue that I have seen to date. A portion of the myriad matrix failed to take and was hanging loose. It appears that myriad morcells were placed into the tunnel closest to the dorsal portion of his foot. These have sloughed off. The left plantar foot ulcer is about the same size, but has a much healthier surface than in the past. Both plantar ulcers have callus and slough accumulation. Patient History Information obtained from Patient. Family History No family history of Cancer, Diabetes, Hereditary Spherocytosis, Hypertension, Kidney  Disease, Lung Disease, Seizures, Stroke, Thyroid Problems, Tuberculosis. Social History Never smoker, Marital Status - Single, Alcohol Use - Rarely, Drug Use - No History, Caffeine Use - Never. Medical History Eyes Denies history of Cataracts, Glaucoma, Optic Neuritis Ear/Nose/Mouth/Throat Denies history of Chronic sinus problems/congestion, Middle ear problems Hematologic/Lymphatic Denies history of Anemia, Hemophilia, Human Immunodeficiency Virus, Lymphedema, Sickle Cell Disease Respiratory Denies history of Aspiration, Asthma, Chronic Obstructive Pulmonary Disease (COPD), Pneumothorax, Sleep Apnea, Tuberculosis Cardiovascular Denies history of Angina, Arrhythmia, Congestive Heart Failure, Coronary Artery Disease, Deep Vein Thrombosis, Hypertension, Hypotension, Myocardial Infarction, Peripheral Arterial Disease, Peripheral Venous Disease, Phlebitis, Vasculitis Gastrointestinal Denies history of Cirrhosis , Colitis, Crohnoos, Hepatitis A, Hepatitis B, Hepatitis C Endocrine Patient has history of Type II Diabetes Denies history of Type I Diabetes Immunological Denies history of Lupus Erythematosus, Raynaudoos, Scleroderma Integumentary (Skin) Denies history of History of Burn Musculoskeletal Denies history of Gout, Rheumatoid Arthritis, Osteoarthritis, Osteomyelitis Neurologic Denies history of Dementia, Neuropathy, Quadriplegia, Paraplegia,  Seizure Disorder Oncologic Denies history of Received Chemotherapy, Received Radiation Psychiatric Denies history of Anorexia/bulimia, Confinement Anxiety Hospitalization/Surgery History - 11/1-11/06/2018- sepsis foot infection. - 11/4-11/5 02 sats low respiratory distress. Objective Constitutional Slightly hypertensive. Slightly tachycardic, asymptomatic. No acute distress.. Vitals Time Taken: 12:51 PM, Height: 77 in, Weight: 280 lbs, BMI: 33.2, Temperature: 98.0 F, Pulse: 102 bpm, Respiratory Rate: 18 breaths/min, Blood Pressure: 140/88 mmHg, Capillary Blood Glucose: 124 mg/dl. Respiratory Normal work of breathing on room air.. General Notes: 11/24/2021: Both of the dorsal foot/ankle wounds are substantially smaller. There is just a little eschar overlying the left sided wound and some eschar and slough on the right. The right plantar foot ulcer has the healthiest surface of granulation tissue that I have seen to date. A portion of the myriad matrix failed to take and was hanging loose. It appears that myriad morcells were placed into the tunnel closest to the dorsal portion of his foot. These have sloughed off. The left plantar foot ulcer is about the same size, but has a much healthier surface than in the past. Both plantar ulcers have callus and slough accumulation. Integumentary (Hair, Skin) Wound #11 status is Open. Original cause of wound was Gradually Appeared. The date acquired was: 08/20/2021. The wound has been in treatment 13 weeks. The wound is located on the [place]. The wound measures 2cm length x 2cm width x 0.1cm depth; 3.142cm^2 area and 0.314cm^3 volume. There is Fat Layer (Subcutaneous Tissue) exposed. There is no tunneling or undermining noted. There is a medium amount of serosanguineous drainage noted. The wound margin is distinct with the outline attached to the wound base. There is medium (34-66%) red, pink granulation within the wound bed. There is a  medium (34-66%) amount of necrotic tissue within the wound bed. Wound #12 status is Open. Original cause of wound was Gradually Appeared. The date acquired was: 09/29/2021. The wound has been in treatment 8 weeks. The wound is located on the Left,Dorsal Foot. The wound measures 0.5cm length x 0.5cm width x 0.1cm depth; 0.196cm^2 area and 0.02cm^3 volume. There is Fat Layer (Subcutaneous Tissue) exposed. There is no tunneling or undermining noted. There is a medium amount of serosanguineous drainage noted. The wound margin is distinct with the outline attached to the wound base. There is medium (34-66%) red granulation within the wound bed. There is a medium (34- 66%) amount of necrotic tissue within the wound bed including Adherent Slough. Wound #3 status is Open. Original cause of wound was Trauma. The date acquired was: 10/02/2019.  The wound has been in treatment 108 weeks. The wound is located on the [place]. The wound measures 3.6cm length x 4.5cm width x 0.5cm depth; 12.723cm^2 area and 6.362cm^3 volume. There is Fat Layer (Subcutaneous Tissue) exposed. There is no tunneling noted, however, there is undermining starting at 4:00 and ending at 9:00 with a maximum distance of 0.6cm. There is a medium amount of serosanguineous drainage noted. The wound margin is well defined and not attached to the wound base. There is small (1-33%) pink granulation within the wound bed. There is a medium (34-66%) amount of necrotic tissue within the wound bed including Adherent Slough. Wound #8 status is Open. Original cause of wound was Blister. The date acquired was: 04/18/2021. The wound has been in treatment 31 weeks. The wound is located on the Right,Lateral Foot. The wound measures 4cm length x 2.3cm width x 0.1cm depth; 7.226cm^2 area and 0.723cm^3 volume. There is Fat Layer (Subcutaneous Tissue) exposed. There is no tunneling or undermining noted. There is a large amount of serosanguineous  drainage noted. The wound margin is well defined and not attached to the wound base. There is medium (34-66%) red, pink granulation within the wound bed. There is a medium (34-66%) amount of necrotic tissue within the wound bed including Adherent Slough. 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 There was a Selective/Open Wound Skin/Epidermis Debridement with a total area of 1 sq cm performed by [name], MD. With the following instrument(s): Curette to remove Viable and Non-Viable tissue/material. Material removed includes Eschar, Callus, Slough, and Skin: Epidermis. No specimens were taken. A time out was conducted at 13:18, prior to the start of the procedure. A Minimum amount of bleeding was controlled with Pressure. The procedure was tolerated well with a pain level of 0 throughout and a pain level of 0 following the procedure. Post Debridement Measurements: 2cm length x 2cm width x 0.1cm depth; 0.314cm^3 volume. Character of Wound/Ulcer Post Debridement requires further debridement. Severity of Tissue Post Debridement is: Fat layer exposed. Post procedure Diagnosis Wound #11: Same as Pre-Procedure Wound #12 Pre-procedure diagnosis of Wound #12 is a Diabetic Wound/Ulcer of the Lower Extremity located on the Left,Dorsal Foot .Severity of Tissue Pre Debridement is: Fat layer exposed. There was a Selective/Open Wound Non-Viable Tissue Debridement with a  total area of 0.25 sq cm performed by [name], MD. With the following instrument(s): Curette to remove Viable and Non-Viable tissue/material. Material removed includes Eschar and Slough and. No specimens were taken. A time out was conducted at 13:18, prior to the start of the procedure. A Minimum amount of bleeding was controlled with Pressure. The procedure was tolerated well with a pain level of 0 throughout and a pain level of 0 following the procedure. Post Debridement Measurements: 0.5cm length x 0.5cm width x 0.1cm depth; 0.02cm^3 volume. Character of Wound/Ulcer Post Debridement requires further debridement. Severity of Tissue Post Debridement is: Fat layer exposed. Post procedure Diagnosis Wound #12: Same as Pre-Procedure Wound #3 Pre-procedure diagnosis of Wound #3 is a Diabetic Wound/Ulcer  of the Lower Extremity located on the [place] .Severity of Tissue Pre Debridement is: Fat layer exposed. There was a Selective/Open Wound Skin/Epidermis Debridement with a total area of 16.2 sq cm performed by [name], MD. With the following instrument(s): Curette to remove Viable and Non-Viable tissue/material. Material removed includes Eschar, Callus, Slough, and Skin: Epidermis. No specimens were taken. A time out was conducted at 13:18, prior to the start of the procedure. A Minimum amount of bleeding was controlled with Pressure. The procedure was tolerated well with a pain level of 0 throughout and a pain level of 0 following the procedure. Post Debridement Measurements: 3.6cm length x 4.5cm width x 0.5cm depth; 6.362cm^3 volume. Character of Wound/Ulcer Post Debridement requires further debridement. Severity of Tissue Post Debridement is: Fat layer exposed. Post procedure Diagnosis Wound #3: Same as Pre-Procedure Wound #8 Pre-procedure diagnosis of Wound #8 is a Diabetic Wound/Ulcer of the Lower Extremity located on the Right,Lateral Foot .Severity of Tissue Pre  Debridement is: Fat layer exposed. There was a Selective/Open Wound Skin/Epidermis Debridement with a total area of 9.2 sq cm performed by [name], MD. With the following instrument(s): Curette, Forceps, and Scissors to remove Viable and Non-Viable tissue/material. Material removed includes Eschar, Callus, Slough, and Skin: Epidermis. No specimens were taken. A time out was conducted at 13:18, prior to the start of the procedure. A Minimum amount of bleeding was controlled with Pressure. The procedure was tolerated well with a pain level of 0 throughout and a pain level of 0 following the procedure. Post Debridement Measurements: 4cm length x 2.3cm width x 0.1cm depth; 0.723cm^3 volume. Character of Wound/Ulcer Post Debridement requires further debridement. Severity of Tissue Post Debridement is: Fat layer exposed. Post procedure Diagnosis Wound #8: Same as Pre-Procedure Plan Follow-up Appointments: Return Appointment in 1 week. - Dr. [name] - Room 1 with [facility] Friday 12/03/21 @ 8:15 AM Bathing/ Shower/ Hygiene: May shower and wash wound with soap and water. Edema Control - Lymphedema / SCD / Other: Elevate legs to the level of the heart or above for 30 minutes daily and/or when sitting, a frequency of: - throughout the day Avoid standing for long periods of time. Exercise regularly Compression stocking or Garment 20-30 mm/Hg pressure to: - to both legs daily Off-Loading: Open toe surgical shoe to: - Both feet Additional Orders / Instructions: Follow Nutritious Diet WOUND #11: - Foot Wound Laterality: Right, Anterior Cleanser: Soap and Water 1 x Per Day/30 Days Discharge Instructions: May shower and wash wound with dial antibacterial soap and water prior to dressing change. Peri-Wound Care: Sween Lotion (Moisturizing lotion) 1 x Per Day/30 Days Discharge Instructions: Apply moisturizing lotion as directed Topical: keystone antibiotic compound 1 x Per Day/30 Days Prim Dressing:  Promogran Prisma Matrix, 4.34 (sq in) (silver collagen) (DME) (Dispense As Written) 1 x Per Day/30 Days ary Discharge Instructions: Moisten collagen with saline or hydrogel Secondary Dressing: ABD Pad, 8x10 (DME) (Dispense As Written) 1 x Per Day/30 Days Discharge Instructions: Apply over primary dressing as directed. Secondary Dressing: Optifoam Non-Adhesive Dressing, 4x4 in (DME) (Dispense As Written) 1 x Per Day/30 Days Discharge Instructions: Apply over primary dressing as directed. Secondary Dressing: NonWoven Sponge, 4x4 (in/in) (Generic) 1 x Per Day/30 Days Secured With: 37M Medipore H Soft Cloth Surgical T ape, 4 x 10 (in/yd) (Generic) 1 x Per Day/30 Days Discharge Instructions: Secure with tape as directed. Com pression Wrap: Kerlix Roll 4.5x3.1 (in/yd) 1 x Per Day/30 Days Discharge Instructions: Apply Kerlix and Coban compression  as directed. Com pression Wrap: 87M ACE Elastic Bandage With VELCRO Brand Closure, 4 (in) (DME) (Generic) 1 x Per Day/30 Days WOUND #12: - Foot Wound Laterality: Dorsal, Left Cleanser: Soap and Water 1 x Per Day/30 Days Discharge Instructions: May shower and wash wound with dial antibacterial soap and water prior to dressing change. Peri-Wound Care: Sween Lotion (Moisturizing lotion) 1 x Per Day/30 Days Discharge Instructions: Apply moisturizing lotion as directed Topical: keystone antibiotic compound 1 x Per Day/30 Days Prim Dressing: Promogran Prisma Matrix, 4.34 (sq in) (silver collagen) (DME) (Dispense As Written) 1 x Per Day/30 Days ary Discharge Instructions: Moisten collagen with saline or hydrogel Secondary Dressing: ABD Pad, 8x10 (DME) (Dispense As Written) 1 x Per Day/30 Days Discharge Instructions: Apply over primary dressing as directed. Secondary Dressing: Optifoam Non-Adhesive Dressing, 4x4 in (DME) (Dispense As Written) 1 x Per Day/30 Days Discharge Instructions: Apply over primary dressing as directed. Secondary Dressing: NonWoven Sponge,  4x4 (in/in) (Generic) 1 x Per Day/30 Days Secured With: 87M Medipore H Soft Cloth Surgical T ape, 4 x 10 (in/yd) (Generic) 1 x Per Day/30 Days Discharge Instructions: Secure with tape as directed. Com pression Wrap: Kerlix Roll 4.5x3.1 (in/yd) 1 x Per Day/30 Days Discharge Instructions: Apply Kerlix and Coban compression as directed. Com pression Wrap: 87M ACE Elastic Bandage With VELCRO Brand Closure, 4 (in) (DME) (Generic) 1 x Per Day/30 Days WOUND #3: - Foot Wound Laterality: Plantar, Left, Lateral Cleanser: Soap and Water 1 x Per Day/30 Days Discharge Instructions: May shower and wash wound with dial antibacterial soap and water prior to dressing change. Peri-Wound Care: Sween Lotion (Moisturizing lotion) 1 x Per Day/30 Days Discharge Instructions: Apply moisturizing lotion as directed Topical: keystone antibiotic compound 1 x Per Day/30 Days Prim Dressing: Promogran Prisma Matrix, 4.34 (sq in) (silver collagen) (DME) (Dispense As Written) 1 x Per Day/30 Days ary Discharge Instructions: Moisten collagen with saline or hydrogel Secondary Dressing: ABD Pad, 8x10 (DME) (Dispense As Written) 1 x Per Day/30 Days Discharge Instructions: Apply over primary dressing as directed. Secondary Dressing: Optifoam Non-Adhesive Dressing, 4x4 in (DME) (Dispense As Written) 1 x Per Day/30 Days Discharge Instructions: Apply over primary dressing as directed. Secondary Dressing: NonWoven Sponge, 4x4 (in/in) (Generic) 1 x Per Day/30 Days Secured With: 87M Medipore H Soft Cloth Surgical T ape, 4 x 10 (in/yd) (Generic) 1 x Per Day/30 Days Discharge Instructions: Secure with tape as directed. Com pression Wrap: Kerlix Roll 4.5x3.1 (in/yd) 1 x Per Day/30 Days Discharge Instructions: Apply Kerlix and Coban compression as directed. Com pression Wrap: 87M ACE Elastic Bandage With VELCRO Brand Closure, 4 (in) (DME) (Generic) 1 x Per Day/30 Days WOUND #8: - Foot Wound Laterality: Right, Lateral Cleanser: Soap and Water  1 x Per Day/30 Days Discharge Instructions: May shower and wash wound with dial antibacterial soap and water prior to dressing change. Peri-Wound Care: Sween Lotion (Moisturizing lotion) 1 x Per Day/30 Days Discharge Instructions: Apply moisturizing lotion as directed Topical: keystone antibiotic compound 1 x Per Day/30 Days Prim Dressing: Promogran Prisma Matrix, 4.34 (sq in) (silver collagen) (DME) (Dispense As Written) 1 x Per Day/30 Days ary Discharge Instructions: Moisten collagen with saline or hydrogel Secondary Dressing: ABD Pad, 8x10 (DME) (Dispense As Written) 1 x Per Day/30 Days Discharge Instructions: Apply over primary dressing as directed. Secondary Dressing: Optifoam Non-Adhesive Dressing, 4x4 in (DME) (Dispense As Written) 1 x Per Day/30 Days Discharge Instructions: Apply over primary dressing as directed. Secondary Dressing: NonWoven Sponge, 4x4 (in/in) (Generic) 1 x Per  Day/30 Days Secured With: 81M Medipore H Soft Cloth Surgical T ape, 4 x 10 (in/yd) (Generic) 1 x Per Day/30 Days Discharge Instructions: Secure with tape as directed. Com pression Wrap: Kerlix Roll 4.5x3.1 (in/yd) 1 x Per Day/30 Days Discharge Instructions: Apply Kerlix and Coban compression as directed. Com pression Wrap: 81M ACE Elastic Bandage With VELCRO Brand Closure, 4 (in) (DME) (Generic) 1 x Per Day/30 Days 11/24/2021: Both of the dorsal foot/ankle wounds are substantially smaller. There is just a little eschar overlying the left sided wound and some eschar and slough on the right. The right plantar foot ulcer has the healthiest surface of granulation tissue that I have seen to date. A portion of the myriad matrix failed to take and was hanging loose. It appears that myriad morcells were placed into the tunnel closest to the dorsal portion of his foot. These have sloughed off. The left plantar foot ulcer is about the same size, but has a much healthier surface than in the past. Both plantar ulcers have  callus and slough accumulation. I used a curette to debride eschar off of the left dorsal foot wound, eschar and slough off of the right dorsal foot wound. Slough and callus from both of the plantar ulcers. I also trimmed the hanging piece of non-integrated matrix from the right foot wound. Both wounds look a little drier than desirable in the presence of extracellular matrix. We will use Prisma silver collagen moistened with hydrogel to try and improve the moisture balance. 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 I think to give his wounds the best chance of healing, he needs to stay out of work until his wounds have made significant improvement. We will write him a note to that effect and he will send his FMLA paperwork to [name] to fill out. I will see him back in 1 week's time. Electronic Signature(s) Signed: 11/24/2021 1:39:47 PM By: [name] MD FACS Entered By: [name] on 11/24/2021 13:39:47 -------------------------------------------------------------------------------- HxROS Details Patient Name: Date of Service: [name], [name] 11/24/2021 12:45 PM Medical Record Number: [id number] Patient Account Number: [id number] Date of Birth/Sex: Treating RN: [birth year] (36 y.o. [name] Primary Care Provider: [name] Other Clinician: Referring Provider: Treating Provider/Extender: [name] in Treatment: 74 Information Obtained From Patient Eyes Medical History: Negative for: Cataracts; Glaucoma; Optic Neuritis Ear/Nose/Mouth/Throat Medical History: Negative for: Chronic sinus problems/congestion; Middle ear problems Hematologic/Lymphatic Medical History: Negative for: Anemia; Hemophilia; Human Immunodeficiency Virus; Lymphedema; Sickle Cell Disease Respiratory Medical History: Negative for: Aspiration; Asthma; Chronic Obstructive  Pulmonary Disease (COPD); Pneumothorax; Sleep Apnea; Tuberculosis Cardiovascular Medical History: Negative for: Angina; Arrhythmia; Congestive Heart Failure; Coronary Artery Disease; Deep Vein Thrombosis; Hypertension; Hypotension; Myocardial Infarction; Peripheral Arterial Disease; Peripheral Venous Disease; Phlebitis; Vasculitis Gastrointestinal Medical History: Negative for: Cirrhosis ; Colitis; Crohns; Hepatitis A; Hepatitis B; Hepatitis C Endocrine Medical History: Positive for: Type II Diabetes Negative for: Type I Diabetes Time with diabetes: 8 Treated with: Insulin Blood sugar tested every day: No Immunological Medical History: Negative for: Lupus Erythematosus; Raynauds; Scleroderma Integumentary (Skin) Medical History: Negative for: History of Burn Musculoskeletal Medical History: Negative for: Gout; Rheumatoid Arthritis; Osteoarthritis; Osteomyelitis Neurologic Medical History: Negative for: Dementia; Neuropathy; Quadriplegia; Paraplegia; Seizure Disorder Oncologic Medical History: Negative for: Received Chemotherapy; Received Radiation Psychiatric Medical History: Negative for: Anorexia/bulimia; Confinement Anxiety Immunizations Pneumococcal Vaccine: Received Pneumococcal Vaccination: No Implantable Devices None  Hospitalization / Surgery History Type of Hospitalization/Surgery 11/1-11/06/2018- sepsis foot infection 11/4-11/5 02 sats low respiratory distress Family and Social History Cancer: No; Diabetes: No; Hereditary Spherocytosis: No; Hypertension: No; Kidney Disease: No; Lung Disease: No; Seizures: No; Stroke: No; Thyroid Problems: No; Tuberculosis: No; Never smoker; Marital Status - Single; Alcohol Use: Rarely; Drug Use: No History; Caffeine Use: Never; Financial Concerns: No; Food, Clothing or Shelter Needs: No; Support System Lacking: No; Transportation Concerns: No Electronic Signature(s) Signed: 11/24/2021 2:22:30 PM By: [name] MD  FACS Signed: 11/29/2021 5:02:03 PM By: Sabas Sous By: [name] on 11/24/2021 13:36:28 -------------------------------------------------------------------------------- [place] Details Patient Name: Date of Service: [name], [name] 11/24/2021 Medical Record Number: [id number] Patient Account Number: [id number] Date of Birth/Sex: Treating RN: 1986/03/22 (36 y.o. 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 Facility Procedures CPT4 Code: [id number] Description: [phone] - DEBRIDE WOUND 1ST 20 SQ CM OR < ICD-10 Diagnosis Description L97.528 Non-pressure chronic ulcer of other part of left foot with other specified severity L97.518 Non-pressure chronic ulcer of other part of right foot with other  specified severit 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 Modifier: y Quantity: 1 CPT4 Code: [id number] Description: 19509 - DEBRIDE WOUND EA ADDL 20 SQ CM ICD-10 Diagnosis Description L97.528 Non-pressure chronic ulcer of other part of left foot with other specified severi L97.518 Non-pressure chronic ulcer of other part of right foot with other specified  sever 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 Modifier: ty ity Quantity: 1 Physician Procedures : CPT4 Code  Description Modifier [id number] - WC PHYS LEVEL 3 - EST PT 25 ICD-10 Diagnosis Description 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 L97.528 Non-pressure chronic ulcer of other part of left foot with  other specified severity L97.518 Non-pressure chronic ulcer of other part of right foot with other specified severity L97.322 Non-pressure chronic ulcer of left ankle with fat layer exposed Quantity: 1 : [id number] - WC PHYS DEBR WO ANESTH 20 SQ CM ICD-10 Diagnosis Description L97.528 Non-pressure chronic ulcer of other part of left foot with other specified severity L97.518 Non-pressure chronic ulcer of other part of right foot with other specified  severity 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

## 2021-12-01 ENCOUNTER — Encounter (HOSPITAL_BASED_OUTPATIENT_CLINIC_OR_DEPARTMENT_OTHER): Payer: BC Managed Care – PPO | Admitting: General Surgery

## 2021-12-02 ENCOUNTER — Ambulatory Visit (INDEPENDENT_AMBULATORY_CARE_PROVIDER_SITE_OTHER): Payer: BC Managed Care – PPO | Admitting: Ophthalmology

## 2021-12-02 DIAGNOSIS — E103412 Type 1 diabetes mellitus with severe nonproliferative diabetic retinopathy with macular edema, left eye: Secondary | ICD-10-CM | POA: Diagnosis not present

## 2021-12-02 MED ORDER — BEVACIZUMAB CHEMO INJECTION 1.25MG/0.05ML SYRINGE FOR KALEIDOSCOPE
1.2500 mg | INTRAVITREAL | Status: AC | PRN
  Administered 2021-12-02: 1.25 mg via INTRAVITREAL

## 2021-12-02 NOTE — Assessment & Plan Note (Signed)
The nature of diabetic macular edema was discussed with the patient. Treatment options were outlined including medical therapy, laser & vitrectomy. The use of injectable medications reviewed, including Avastin, Lucentis, and Eylea. Periodic injections into the eye are likely to resolve diabetic macular edema (swelling in the center of vision). Initially, injections are delivered are delivered every 4-6 weeks, and the interval extended as the condition improves. On average, 8-9 injections the first year, and 5 in year 2. Improvement in the condition most often improves on medical therapy. Occasional use of focal laser is also recommended for residual macular edema (swelling). Excellent control of blood glucose and blood pressure are encouraged under the care of a primary physician or endocrinologist. Similarly, attempts to maintain serum cholesterol, low density lipoproteins, and high-density lipoproteins in a favorable range were recommended.  

## 2021-12-02 NOTE — Progress Notes (Signed)
12/02/2021     CHIEF COMPLAINT Patient presents for  Chief Complaint  Patient presents with   Diabetic Retinopathy with Macular Edema      HISTORY OF PRESENT ILLNESS: [name] is a 36 y.o. male who presents to the clinic today for:   HPI   2 weeks for NO DILATE, AVASTIN OCT, OS. Pt stated no vision changes since last visit.  Last edited by Angeline Slim on 12/02/2021  3:27 PM.      Referring physician: [name], MD [address] [place],  [place] [address]  HISTORICAL INFORMATION:   Selected notes from the MEDICAL RECORD NUMBER    Lab Results  Component Value Date   HGBA1C 8.0 (H) 09/13/2021     CURRENT MEDICATIONS: No current outpatient medications on file. (Ophthalmic Drugs)   No current facility-administered medications for this visit. (Ophthalmic Drugs)   Current Outpatient Medications (Other)  Medication Sig   Continuous Blood Gluc Sensor (FREESTYLE LIBRE 14 DAY SENSOR) MISC Inject 1 patch into the skin every 14 (fourteen) days.   Continuous Blood Gluc Sensor MISC 1 each by Does not apply route as directed. Use as directed every 14 days. May dispense FreeStyle [employer] or similar.   Insulin Disposable Pump (OMNIPOD DASH 5 PACK PODS) MISC Inject into the skin.   insulin glargine (LANTUS SOLOSTAR) 100 UNIT/ML Solostar Pen Inject 40 Units into the skin daily before breakfast. (Patient taking differently: Inject 40-80 Units into the skin daily before breakfast. Sliding scale per patient)   No current facility-administered medications for this visit. (Other)      REVIEW OF SYSTEMS: ROS   Negative for: Constitutional, Gastrointestinal, Neurological, Skin, Genitourinary, Musculoskeletal, HENT, Endocrine, Cardiovascular, Eyes, Respiratory, Psychiatric, Allergic/Imm, Heme/Lymph Last edited by Angeline Slim on 12/02/2021  3:27 PM.       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;  Surgeon: Toni Arthurs, MD;  Location: [facility];  Service: Orthopedics;  Laterality: Left;   AMPUTATION Right 10/22/2020   Procedure: Right Fifth ray amputation;  Surgeon: Toni Arthurs, MD;  Location: [facility];  Service: Orthopedics;  Laterality: Right;   I & D EXTREMITY Bilateral 10/30/2021   Procedure: DEBRIDEMENT AND GRAFT APPLICATION;  Surgeon: Edwin Cap, DPM;  Location: [facility];  Service: Podiatry;  Laterality: Bilateral;   WOUND DEBRIDEMENT Bilateral 10/02/2019   Procedure: DEBRIDEMENT WOUNDS BOTH LOWER EXTREMITIES WITH BONE BIOPSIES;  Surgeon: Vivi Barrack, DPM;  Location: [facility];  Service: Podiatry;  Laterality: Bilateral;   WOUND DEBRIDEMENT Right 10/27/2021   Procedure: RIGHT FOOT DEBRIDEMENT WOUND AND BONE BIOPSY;  Surgeon: Edwin Cap, DPM;  Location: [facility];  Service: Podiatry;  Laterality: Right;    FAMILY HISTORY Family History  Problem Relation Age of Onset   Hypertension Other    Diabetes Other    Healthy Mother    Colon cancer Neg Hx    Stomach cancer Neg Hx    Pancreatic cancer Neg Hx    Esophageal cancer Neg Hx     SOCIAL HISTORY Social History   Tobacco Use   Smoking  status: Never   Smokeless tobacco: Never  Vaping Use   Vaping Use: Never used  Substance Use Topics   Alcohol use: No   Drug use: No         OPHTHALMIC EXAM:  Base Eye Exam     Visual Acuity (ETDRS)       Right Left   Dist [place] 20/60 -2 20/70   Dist ph [place] 20/30 -2 20/60 -1         Tonometry (Tonopen, 3:32 PM)       Right Left   Pressure 12 13         Pupils       Pupils APD   Right PERRL None   Left PERRL None          Visual Fields       Left Right    Full Full         Extraocular Movement       Right Left    Full, Ortho Full, Ortho         Neuro/Psych     Oriented x3: Yes   Mood/Affect: Normal         Dilation     Both eyes: 1.0% Mydriacyl, 2.5% Phenylephrine @ 3:31 PM           Slit Lamp and Fundus Exam     External Exam       Right Left   External Normal Normal         Slit Lamp Exam       Right Left   Lids/Lashes Normal Normal   Conjunctiva/Sclera White and quiet White and quiet   Cornea Clear Clear   Anterior Chamber Deep and quiet Deep and quiet   Iris Round and reactive Round and reactive   Lens 2+ Nuclear sclerosis 2+ Nuclear sclerosis, sub anterior capsule opacity, 4+ Anterior cortical changes, individual axis   Anterior Vitreous Normal Normal         Fundus Exam       Right Left   Posterior Vitreous  Normal   Disc  Normal   C/D Ratio  0.5   Macula  Exudates, Microaneurysms, Macular thickening minor   Vessels  Severe nonproliferative diabetic retinopathy   Periphery  Normal            IMAGING AND PROCEDURES  Imaging and Procedures for 12/02/21  OCT, Retina - OU - Both Eyes       Right Eye Quality was good. Scan locations included subfoveal. Central Foveal Thickness: 248. Progression has worsened. Findings include normal foveal contour.   Left Eye Quality was good. Scan locations included subfoveal. Central Foveal Thickness: 277. Progression has improved. Findings include abnormal foveal contour, cystoid macular edema.   Notes New active CSME OS, recurrence of CSME inferotemporal to FAZ.   OD with very small focal changes temporal to the fovea with good visual acuity can observe.     Intravitreal Injection, Pharmacologic Agent - OS - Left Eye       Time Out 12/02/2021. 4:10 PM. Confirmed correct patient, procedure, site, and patient consented.   Anesthesia Topical anesthesia was used. Anesthetic medications included  [place] 3.5%.   Procedure Preparation included 10% betadine to eyelids, Ofloxacin . A 30 gauge needle was used.   Injection: 1.25 mg Bevacizumab 1.25mg /0.22ml   Route: Intravitreal, Site: Left Eye   NDC: [id number], Lot: [id number], Expiration date: 02/16/2022   Post-op Post injection exam found visual acuity of at least  counting fingers. The patient tolerated the procedure well. There were no complications. The patient received written and verbal post procedure care education. Post injection medications were not given.              ASSESSMENT/PLAN:  Severe nonproliferative diabetic retinopathy of left eye, with macular edema, associated with type 1 diabetes mellitus (HCC)  The nature of diabetic macular edema was discussed with the patient. Treatment options were outlined including medical therapy, laser & vitrectomy. The use of injectable medications reviewed, including Avastin, Lucentis, and Eylea. Periodic injections into the eye are likely to resolve diabetic macular edema (swelling in the center of vision). Initially, injections are delivered are delivered every 4-6 weeks, and the interval extended as the condition improves. On average, 8-9 injections the first year, and 5 in year 2. Improvement in the condition most often improves on medical therapy. Occasional use of focal laser is also recommended for residual macular edema (swelling). Excellent control of blood glucose and blood pressure are encouraged under the care of a primary physician or endocrinologist. Similarly, attempts to maintain serum cholesterol, low density lipoproteins, and high-density lipoproteins in a favorable range were recommended.       ICD-10-CM   1. Severe nonproliferative diabetic retinopathy of left eye, with macular edema, associated with type 1 diabetes mellitus (HCC)  E10.3412 OCT, Retina - OU - Both Eyes    Intravitreal Injection, Pharmacologic Agent - OS - Left Eye    Bevacizumab (AVASTIN) SOLN 1.25 mg       1.  Intravitreal Avastin delivered today in the left eye.  Plan is to follow-up in 2 to 3 weeks for focal laser photocoagulation so as to decrease the anxiety producing injections in this patient and decrease treatment burden long-term.  2.  3.  Ophthalmic Meds Ordered this visit:  Meds ordered this encounter  Medications   Bevacizumab (AVASTIN) SOLN 1.25 mg       Return in about 3 weeks (around 12/23/2021) for dilate, OS, OCT, FOCAL.  There are no Patient Instructions on file for this visit.   Explained the diagnoses, plan, and follow up with the patient and they expressed understanding.  Patient expressed understanding of the importance of proper follow up care.   Alford Highland Tu Shimmel M.D. Diseases & Surgery of the Retina and Vitreous [facility] 12/02/21     Abbreviations: M myopia (nearsighted); A astigmatism; H hyperopia (farsighted); P presbyopia; Mrx spectacle prescription;  CTL contact lenses; OD right eye; OS left eye; OU both eyes  XT exotropia; ET esotropia; PEK punctate epithelial keratitis; PEE punctate epithelial erosions; DES dry eye syndrome; MGD meibomian gland dysfunction; ATs artificial tears; PFAT's preservative free artificial tears; NSC nuclear sclerotic cataract; PSC posterior subcapsular cataract; ERM epi-retinal membrane; PVD posterior vitreous detachment; RD retinal detachment; DM diabetes mellitus; DR diabetic retinopathy; NPDR non-proliferative diabetic retinopathy; PDR proliferative diabetic retinopathy; CSME clinically significant macular edema; DME diabetic macular edema; dbh dot blot hemorrhages; CWS cotton wool spot; POAG primary open angle glaucoma; C/D cup-to-disc ratio; HVF humphrey visual field; GVF goldmann visual field; OCT optical coherence tomography; IOP intraocular pressure; BRVO Branch retinal vein occlusion; CRVO central retinal vein occlusion; CRAO central retinal artery occlusion; BRAO branch retinal artery occlusion; RT  retinal tear; SB scleral buckle; PPV pars plana vitrectomy; VH Vitreous hemorrhage; PRP panretinal laser photocoagulation; IVK intravitreal kenalog; VMT vitreomacular traction; MH Macular hole;  NVD neovascularization of the disc; NVE neovascularization elsewhere; AREDS age related eye disease study; ARMD age related  macular degeneration; POAG primary open angle glaucoma; EBMD epithelial/anterior basement membrane dystrophy; ACIOL anterior chamber intraocular lens; IOL intraocular lens; PCIOL posterior chamber intraocular lens; Phaco/IOL phacoemulsification with intraocular lens placement; [place] photorefractive keratectomy; LASIK laser assisted in situ keratomileusis; HTN hypertension; DM diabetes mellitus; COPD chronic obstructive pulmonary disease

## 2021-12-07 ENCOUNTER — Encounter (HOSPITAL_BASED_OUTPATIENT_CLINIC_OR_DEPARTMENT_OTHER): Payer: BC Managed Care – PPO | Attending: General Surgery | Admitting: General Surgery

## 2021-12-07 DIAGNOSIS — E11621 Type 2 diabetes mellitus with foot ulcer: Secondary | ICD-10-CM | POA: Insufficient documentation

## 2021-12-07 DIAGNOSIS — L97528 Non-pressure chronic ulcer of other part of left foot with other specified severity: Secondary | ICD-10-CM | POA: Insufficient documentation

## 2021-12-07 DIAGNOSIS — L97518 Non-pressure chronic ulcer of other part of right foot with other specified severity: Secondary | ICD-10-CM | POA: Diagnosis not present

## 2021-12-07 DIAGNOSIS — L84 Corns and callosities: Secondary | ICD-10-CM | POA: Insufficient documentation

## 2021-12-07 NOTE — Progress Notes (Signed)
[name], Max ([id number]) Visit Report for 12/07/2021 Arrival Information Details Patient Name: Date of Service: [name] D 12/07/2021 9:00 A M Medical Record Number: [id number] Patient Account Number: [id number] Date of Birth/Sex: Treating RN: 01/14/1986 (36 y.o. [name] Primary Care Mars Scheaffer: [name] Other Clinician: Referring Shatira Dobosz: Treating Holland Nickson/Extender: [name] in Treatment: 48 Visit Information History Since Last Visit All ordered tests and consults were completed: Yes Patient Arrived: Ambulatory Added or deleted any medications: No Arrival Time: 08:37 Any new allergies or adverse reactions: No Accompanied By: self Had a fall or experienced change in No Transfer Assistance: None activities of daily living that may affect Patient Identification Verified: Yes risk of falls: Secondary Verification Process Completed: Yes Signs or symptoms of abuse/neglect since last visito No Patient Requires Transmission-Based Precautions: No Hospitalized since last visit: No Patient Has Alerts: No Implantable device outside of the clinic excluding No cellular tissue based products placed in the center since last visit: Has Dressing in Place as Prescribed: Yes Pain Present Now: No Electronic Signature(s) Signed: 12/07/2021 4:56:45 PM By: [name] RN, BSN Entered By: [name] on 12/07/2021 08:42:31 -------------------------------------------------------------------------------- Encounter Discharge Information Details Patient Name: Date of Service: [name], [name] 12/07/2021 9:00 A M Medical Record Number: [id number] Patient Account Number: [id number] Date of Birth/Sex: Treating RN: [birth year] (36 y.o. [name] Primary Care Brax Walen: [name] Other Clinician: Referring Leiann Sporer: Treating Samwise Eckardt/Extender: [name] in Treatment: 110 Encounter Discharge Information Items  Post Procedure Vitals Discharge Condition: Stable Temperature (F): 98.1 Ambulatory Status: Ambulatory Pulse (bpm): 87 Discharge Destination: Home Respiratory Rate (breaths/min): 18 Transportation: Private Auto Blood Pressure (mmHg): 148/91 Accompanied By: self Schedule Follow-up Appointment: Yes Clinical Summary of Care: Electronic Signature(s) Signed: 12/07/2021 4:56:45 PM By: [name] RN, BSN Entered By: [name] on 12/07/2021 09:43:30 -------------------------------------------------------------------------------- Lower Extremity Assessment Details Patient Name: Date of Service: [name], [name] 12/07/2021 9:00 A M Medical Record Number: [id number] Patient Account Number: [id number] Date of Birth/Sex: Treating RN: [birth year] (36 y.o. [name] Primary Care Nechuma Boven: [name] Other Clinician: Referring Heliodoro Domagalski: Treating Reginia Battie/Extender: [name] in Treatment: 110 Edema Assessment Assessed: [name]: No] [Right: No] Edema: [Left: Yes] [Right: Yes] Calf Left: Right: Point of Measurement: 48 cm From Medial Instep 48.5 cm 50 cm Ankle Left: Right: Point of Measurement: 11 cm From Medial Instep 27 cm 29.2 cm Vascular Assessment Pulses: Dorsalis Pedis Palpable: [Left:Yes] [Right:Yes] Electronic Signature(s) Signed: 12/07/2021 4:56:45 PM By: [name] RN, BSN Entered By: [name] on 12/07/2021 08:47:44 -------------------------------------------------------------------------------- Multi Wound Chart Details Patient Name: Date of Service: [name], [name] 12/07/2021 9:00 A M Medical Record Number: [id number] Patient Account Number: [id number] Date of Birth/Sex: Treating RN: [birth year] (36 y.o. M) Primary Care Parminder Cupples: [name] Other Clinician: Referring Caidin Heidenreich: Treating Louiza Moor/Extender: [name] in Treatment: 110 Vital Signs Height(in): 77 Pulse(bpm):  87 Weight(lbs): 280 Blood Pressure(mmHg): 148/91 Body Mass Index(BMI): 33.2 Temperature(F): 98.1 Respiratory Rate(breaths/min): 18 Photos: Right, Anterior Foot Left, Dorsal Foot Left, Lateral, Plantar Foot Wound Location: Gradually Appeared Gradually Appeared Trauma Wounding Event: Diabetic Wound/Ulcer of the Lower Diabetic Wound/Ulcer of the Lower Diabetic Wound/Ulcer of the Lower Primary Etiology: Extremity Extremity Extremity Type II Diabetes Type II Diabetes Type II Diabetes Comorbid History: 08/20/2021 09/29/2021 10/02/2019 Date Acquired: 15 9 110 Weeks of Treatment: Open Healed - Epithelialized Open Wound Status: No No No Wound Recurrence: 0.5x0.5x0.1 0x0x0 3.5x4.2x0.3 Measurements L x  W x D (cm) 0.196 0 11.545 A (cm) : rea 0.02 0 3.464 Volume (cm) : -532.30% 100.00% -600.10% % Reduction in A rea: -566.70% 100.00% -1999.40% % Reduction in Volume: No No No Tunneling: Grade 2 Grade 2 Grade 2 Classification: Medium None Present Medium Exudate A mount: Serosanguineous N/A Serosanguineous Exudate Type: red, brown N/A red, brown Exudate Color: Distinct, outline attached Distinct, outline attached Well defined, not attached Wound Margin: Medium (34-66%) None Present (0%) Small (1-33%) Granulation A mount: Red, Pink N/A Pink Granulation Quality: Medium (34-66%) None Present (0%) Medium (34-66%) Necrotic A mount: Eschar, Adherent Slough N/A Adherent Slough Necrotic Tissue: Fat Layer (Subcutaneous Tissue): Yes Fat Layer (Subcutaneous Tissue): Yes Fat Layer (Subcutaneous Tissue): Yes Exposed Structures: Fascia: No Fascia: No Fascia: No Tendon: No Tendon: No Tendon: No Muscle: No Muscle: No Muscle: No Joint: No Joint: No Joint: No Bone: No Bone: No Bone: No Small (1-33%) Large (67-100%) Small (1-33%) Epithelialization: Debridement - Selective/Open Wound N/A Debridement - Selective/Open Wound Debridement: Pre-procedure Verification/Time Out 09:11  N/A 09:11 Taken: Art therapist, Slough N/A Callus, Slough Tissue Debrided: Non-Viable Tissue N/A Skin/Epidermis Level: 0.25 N/A 14.7 Debridement A (sq cm): rea Curette N/A Curette Instrument: Minimum N/A Minimum Bleeding: Pressure N/A Pressure Hemostasis A chieved: 0 N/A 0 Procedural Pain: 0 N/A 0 Post Procedural Pain: Procedure was tolerated well N/A Procedure was tolerated well Debridement Treatment Response: 0.5x0.5x0.1 N/A 3.5x4.2x0.3 Post Debridement Measurements L x W x D (cm) 0.02 N/A 3.464 Post Debridement Volume: (cm) Debridement N/A Debridement Procedures Performed: Wound Number: 8 N/A N/A Photos: N/A N/A Right, Lateral Foot N/A N/A Wound Location: Blister N/A N/A Wounding Event: Diabetic Wound/Ulcer of the Lower N/A N/A Primary Etiology: Extremity Type II Diabetes N/A N/A Comorbid History: 04/18/2021 N/A N/A Date Acquired: 86 N/A N/A Weeks of Treatment: Open N/A N/A Wound Status: No N/A N/A Wound Recurrence: 4x3.2x0.1 N/A N/A Measurements L x W x D (cm) 10.053 N/A N/A A (cm) : rea 1.005 N/A N/A Volume (cm) : -1727.80% N/A N/A % Reduction in A rea: -813.60% N/A N/A % Reduction in Volume: 9 Position 1 (o'clock): 1 Maximum Distance 1 (cm): Yes N/A N/A Tunneling: Grade 2 N/A N/A Classification: Large N/A N/A Exudate A mount: Serosanguineous N/A N/A Exudate Type: red, brown N/A N/A Exudate Color: Well defined, not attached N/A N/A Wound Margin: Medium (34-66%) N/A N/A Granulation A mount: Red, Pink N/A N/A Granulation Quality: Medium (34-66%) N/A N/A Necrotic A mount: Adherent Slough N/A N/A Necrotic Tissue: Fat Layer (Subcutaneous Tissue): Yes N/A N/A Exposed Structures: Fascia: No Tendon: No Muscle: No Joint: No Bone: No Small (1-33%) N/A N/A Epithelialization: Debridement - Selective/Open Wound N/A N/A Debridement: Pre-procedure Verification/Time Out 09:11 N/A N/A Taken: Callus, Slough N/A N/A Tissue  Debrided: Skin/Epidermis N/A N/A Level: 12.8 N/A N/A Debridement A (sq cm): rea Curette N/A N/A Instrument: Minimum N/A N/A Bleeding: Pressure N/A N/A Hemostasis A chieved: 0 N/A N/A Procedural Pain: 0 N/A N/A Post Procedural Pain: Procedure was tolerated well N/A N/A Debridement Treatment Response: 4x3.2x0.1 N/A N/A Post Debridement Measurements L x W x D (cm) 1.005 N/A N/A Post Debridement Volume: (cm) Debridement N/A N/A Procedures Performed: Treatment Notes Electronic Signature(s) Signed: 12/07/2021 9:19:08 AM By: [name] MD FACS Entered By: [name] on 12/07/2021 09:19:07 -------------------------------------------------------------------------------- Multi-Disciplinary Care Plan Details Patient Name: Date of Service: [name], [name] 12/07/2021 9:00 A M Medical Record Number: [id number] Patient Account Number: [id number] Date of Birth/Sex: Treating RN: [name], 1985 (36 y.o. [name] Primary  Care Trudi Morgenthaler: [name] Other Clinician: Referring Trystin Terhune: Treating Caitlain Tweed/Extender: [name] in Treatment: [place] reviewed with physician Active Inactive Nutrition Nursing Diagnoses: Imbalanced nutrition Potential for alteratiion in Nutrition/Potential for imbalanced nutrition Goals: Patient/caregiver agrees to and verbalizes understanding of need to use nutritional supplements and/or vitamins as prescribed Date Initiated: 10/24/2019 Date Inactivated: 04/06/2020 Target Resolution Date: 04/03/2020 Goal Status: Met Patient/caregiver will maintain therapeutic glucose control Date Initiated: 10/24/2019 Target Resolution Date: 12/22/2021 Goal Status: Active Interventions: Assess HgA1c results as ordered upon admission and as needed Assess patient nutrition upon admission and as needed per policy Provide education on elevated blood sugars and impact on wound healing Provide education on  nutrition Treatment Activities: Education provided on Nutrition : 11/24/2021 Notes: 11/17/20: Glucose control ongoing issue, target date extended. 01/26/21: Glucose management continues. Wound/Skin Impairment Nursing Diagnoses: Impaired tissue integrity Knowledge deficit related to ulceration/compromised skin integrity Goals: Patient/caregiver will verbalize understanding of skin care regimen Date Initiated: 10/24/2019 Target Resolution Date: 12/22/2021 Goal Status: Active Ulcer/skin breakdown will have a volume reduction of 30% by week 4 Date Initiated: 10/24/2019 Date Inactivated: 01/16/2020 Target Resolution Date: 01/10/2020 Unmet Reason: no change in Goal Status: Unmet measurements. Interventions: Assess patient/caregiver ability to obtain necessary supplies Assess patient/caregiver ability to perform ulcer/skin care regimen upon admission and as needed Assess ulceration(s) every visit Provide education on ulcer and skin care Notes: 11/17/20: Wound care regimen continues Electronic Signature(s) Signed: 12/07/2021 4:56:45 PM By: [name] RN, BSN Entered By: [name] on 12/07/2021 09:02:49 -------------------------------------------------------------------------------- Pain Assessment Details Patient Name: Date of Service: [name], [name] 12/07/2021 9:00 A M Medical Record Number: [id number] Patient Account Number: [id number] Date of Birth/Sex: Treating RN: 1985-04-06 (36 y.o. [name] Primary Care Ruthetta Koopmann: [name] Other Clinician: Referring Ivey Nembhard: Treating Lenoard Helbert/Extender: [name] in Treatment: 110 Active Problems Location of Pain Severity and Description of Pain Patient Has Paino No Site Locations Pain Management and Medication Current Pain Management: Electronic Signature(s) Signed: 12/07/2021 4:56:45 PM By: [name] RN, BSN Entered By: [name] on 12/07/2021  08:45:09 -------------------------------------------------------------------------------- Patient/Caregiver Education Details Patient Name: Date of Service: [name], [name] 9/5/2023andnbsp9:00 A M Medical Record Number: [id number] Patient Account Number: [id number] Date of Birth/Gender: Treating RN: [birth year] (36 y.o. [name] Primary Care Physician: [name] Other Clinician: Referring Physician: Treating Physician/Extender: [name] in Treatment: 110 Education Assessment Education Provided To: Patient Education Topics Provided Elevated Blood Sugar/ Impact on Healing: Methods: Explain/Verbal Responses: Reinforcements needed, State content correctly Nutrition: Methods: Explain/Verbal Responses: Reinforcements needed, State content correctly Wound/Skin Impairment: Methods: Explain/Verbal Responses: Reinforcements needed, State content correctly Electronic Signature(s) Signed: 12/07/2021 4:56:45 PM By: [name] RN, BSN Entered By: [name] on 12/07/2021 09:03:15 -------------------------------------------------------------------------------- Wound Assessment Details Patient Name: Date of Service: [name], [name] 12/07/2021 9:00 A M Medical Record Number: [id number] Patient Account Number: [id number] Date of Birth/Sex: Treating RN: [birth year] (36 y.o. [name] Primary Care Audrena Talaga: [name] Other Clinician: Referring Halil Rentz: Treating Doyce Saling/Extender: [name] in Treatment: 110 Wound Status Wound Number: 11 Primary Etiology: Diabetic Wound/Ulcer of the Lower Extremity Wound Location: Right, Anterior Foot Wound Status: Open Wounding Event: Gradually Appeared Comorbid History: Type II Diabetes Date Acquired: 08/20/2021 Weeks Of Treatment: 15 Clustered Wound: No Photos Wound Measurements Length: (cm) 0.5 Width: (cm) 0.5 Depth: (cm) 0.1 Area: (cm) 0.196 Volume:  (cm) 0.02 % Reduction in Area: -532.3% % Reduction in Volume: -  566.7% Epithelialization: Small (1-33%) Tunneling: No Undermining: No Wound Description Classification: Grade 2 Wound Margin: Distinct, outline attached Exudate Amount: Medium Exudate Type: Serosanguineous Exudate Color: red, brown Foul Odor After Cleansing: No Slough/Fibrino Yes Wound Bed Granulation Amount: Medium (34-66%) Exposed Structure Granulation Quality: Red, Pink Fascia Exposed: No Necrotic Amount: Medium (34-66%) Fat Layer (Subcutaneous Tissue) Exposed: Yes Necrotic Quality: Eschar, Adherent Slough Tendon Exposed: No Muscle Exposed: No Joint Exposed: No Bone Exposed: No Treatment Notes Wound #11 (Foot) Wound Laterality: Right, Anterior Cleanser Soap and Water Discharge Instruction: May shower and wash wound with dial antibacterial soap and water prior to dressing change. Peri-Wound Care Sween Lotion (Moisturizing lotion) Discharge Instruction: Apply moisturizing lotion as directed Topical keystone antibiotic compound Primary Dressing Promogran Prisma Matrix, 4.34 (sq in) (silver collagen) Discharge Instruction: Moisten collagen with saline or hydrogel Secondary Dressing ABD Pad, 8x10 Discharge Instruction: Apply over primary dressing as directed. Optifoam Non-Adhesive Dressing, 4x4 in Discharge Instruction: Apply over primary dressing as directed. NonWoven Sponge, 4x4 (in/in) Secured With 61M Medipore H Soft Cloth Surgical T ape, 4 x 10 (in/yd) Discharge Instruction: Secure with tape as directed. Compression Wrap Kerlix Roll 4.5x3.1 (in/yd) Discharge Instruction: Apply Kerlix and Coban compression as directed. 61M ACE Elastic Bandage With VELCRO Brand Closure, 4 (in) Compression Stockings Add-Ons Electronic Signature(s) Signed: 12/07/2021 12:19:15 PM By: Blanche East RN Signed: 12/07/2021 4:56:45 PM By: [name] RN, BSN Entered By: Blanche East on 12/07/2021  08:58:03 -------------------------------------------------------------------------------- Wound Assessment Details Patient Name: Date of Service: [name], [name] 12/07/2021 9:00 A M Medical Record Number: [id number] Patient Account Number: [id number] Date of Birth/Sex: Treating RN: 12-Jan-1986 (36 y.o. [name] Primary Care Sulo Janczak: [name] Other Clinician: Referring Verdun Rackley: Treating Benzion Mesta/Extender: [name] in Treatment: 110 Wound Status Wound Number: 12 Primary Etiology: Diabetic Wound/Ulcer of the Lower Extremity Wound Location: Left, Dorsal Foot Wound Status: Healed - Epithelialized Wounding Event: Gradually Appeared Comorbid History: Type II Diabetes Date Acquired: 09/29/2021 Weeks Of Treatment: 9 Clustered Wound: No Photos Wound Measurements Length: (cm) Width: (cm) Depth: (cm) Area: (cm) Volume: (cm) 0 % Reduction in Area: 100% 0 % Reduction in Volume: 100% 0 Epithelialization: Large (67-100%) 0 Tunneling: No 0 Undermining: No Wound Description Classification: Grade 2 Wound Margin: Distinct, outline attached Exudate Amount: None Present Foul Odor After Cleansing: No Slough/Fibrino No Wound Bed Granulation Amount: None Present (0%) Exposed Structure Necrotic Amount: None Present (0%) Fascia Exposed: No Fat Layer (Subcutaneous Tissue) Exposed: Yes Tendon Exposed: No Muscle Exposed: No Joint Exposed: No Bone Exposed: No Electronic Signature(s) Signed: 12/07/2021 12:19:15 PM By: Blanche East RN Signed: 12/07/2021 4:56:45 PM By: [name] RN, BSN Entered By: Blanche East on 12/07/2021 09:00:08 -------------------------------------------------------------------------------- Wound Assessment Details Patient Name: Date of Service: [name], [name] 12/07/2021 9:00 A M Medical Record Number: [id number] Patient Account Number: [id number] Date of Birth/Sex: Treating RN: 1985-07-14 (36 y.o. [name] Primary Care Mishaal Lansdale: [name] Other Clinician: Referring Fredi Geiler: Treating Kennethia Lynes/Extender: [name] in Treatment: 110 Wound Status Wound Number: 3 Primary Etiology: Diabetic Wound/Ulcer of the Lower Extremity Wound Location: Left, Lateral, Plantar Foot Wound Status: Open Wounding Event: Trauma Comorbid History: Type II Diabetes Date Acquired: 10/02/2019 Weeks Of Treatment: 110 Clustered Wound: No Photos Wound Measurements Length: (cm) 3.5 Width: (cm) 4.2 Depth: (cm) 0.3 Area: (cm) 11.545 Volume: (cm) 3.464 % Reduction in Area: -600.1% % Reduction in Volume: -1999.4% Epithelialization: Small (1-33%) Tunneling: No Undermining: No Wound Description Classification: Grade 2  Wound Margin: Well defined, not attached Exudate Amount: Medium Exudate Type: Serosanguineous Exudate Color: red, brown Foul Odor After Cleansing: No Slough/Fibrino Yes Wound Bed Granulation Amount: Small (1-33%) Exposed Structure Granulation Quality: Pink Fascia Exposed: No Necrotic Amount: Medium (34-66%) Fat Layer (Subcutaneous Tissue) Exposed: Yes Necrotic Quality: Adherent Slough Tendon Exposed: No Muscle Exposed: No Joint Exposed: No Bone Exposed: No Treatment Notes Wound #3 (Foot) Wound Laterality: Plantar, Left, Lateral Cleanser Soap and Water Discharge Instruction: May shower and wash wound with dial antibacterial soap and water prior to dressing change. Peri-Wound Care Sween Lotion (Moisturizing lotion) Discharge Instruction: Apply moisturizing lotion as directed Topical keystone antibiotic compound Primary Dressing Promogran Prisma Matrix, 4.34 (sq in) (silver collagen) Discharge Instruction: Moisten collagen with saline or hydrogel Secondary Dressing ABD Pad, 8x10 Discharge Instruction: Apply over primary dressing as directed. Optifoam Non-Adhesive Dressing, 4x4 in Discharge Instruction: Apply over primary dressing as  directed. NonWoven Sponge, 4x4 (in/in) Secured With 51M Medipore H Soft Cloth Surgical T ape, 4 x 10 (in/yd) Discharge Instruction: Secure with tape as directed. Compression Wrap Kerlix Roll 4.5x3.1 (in/yd) Discharge Instruction: Apply Kerlix and Coban compression as directed. 51M ACE Elastic Bandage With VELCRO Brand Closure, 4 (in) Compression Stockings Add-Ons Electronic Signature(s) Signed: 12/07/2021 12:19:15 PM By: Blanche East RN Signed: 12/07/2021 4:56:45 PM By: [name] RN, BSN Entered By: Blanche East on 12/07/2021 09:00:50 -------------------------------------------------------------------------------- Wound Assessment Details Patient Name: Date of Service: [name], [name] 12/07/2021 9:00 A M Medical Record Number: [id number] Patient Account Number: [id number] Date of Birth/Sex: Treating RN: 1985/10/31 (36 y.o. [name] Primary Care Fannie Gathright: [name] Other Clinician: Referring Aren Cherne: Treating Grae Leathers/Extender: [name] in Treatment: 110 Wound Status Wound Number: 8 Primary Etiology: Diabetic Wound/Ulcer of the Lower Extremity Wound Location: Right, Lateral Foot Wound Status: Open Wounding Event: Blister Comorbid History: Type II Diabetes Date Acquired: 04/18/2021 Weeks Of Treatment: 32 Clustered Wound: No Photos Wound Measurements Length: (cm) 4 Width: (cm) 3.2 Depth: (cm) 0.1 Area: (cm) 10.053 Volume: (cm) 1.005 % Reduction in Area: -1727.8% % Reduction in Volume: -813.6% Epithelialization: Small (1-33%) Tunneling: Yes Position (o'clock): 9 Maximum Distance: (cm) 1 Undermining: No Wound Description Classification: Grade 2 Wound Margin: Well defined, not attached Exudate Amount: Large Exudate Type: Serosanguineous Exudate Color: red, brown Foul Odor After Cleansing: No Slough/Fibrino Yes Wound Bed Granulation Amount: Medium (34-66%) Exposed Structure Granulation Quality: Red, Pink Fascia  Exposed: No Necrotic Amount: Medium (34-66%) Fat Layer (Subcutaneous Tissue) Exposed: Yes Necrotic Quality: Adherent Slough Tendon Exposed: No Muscle Exposed: No Joint Exposed: No Bone Exposed: No Treatment Notes Wound #8 (Foot) Wound Laterality: Right, Lateral Cleanser Soap and Water Discharge Instruction: May shower and wash wound with dial antibacterial soap and water prior to dressing change. Peri-Wound Care Sween Lotion (Moisturizing lotion) Discharge Instruction: Apply moisturizing lotion as directed Topical keystone antibiotic compound Primary Dressing Promogran Prisma Matrix, 4.34 (sq in) (silver collagen) Discharge Instruction: Moisten collagen with saline or hydrogel Secondary Dressing ABD Pad, 8x10 Discharge Instruction: Apply over primary dressing as directed. Optifoam Non-Adhesive Dressing, 4x4 in Discharge Instruction: Apply over primary dressing as directed. NonWoven Sponge, 4x4 (in/in) Secured With 51M Medipore H Soft Cloth Surgical T ape, 4 x 10 (in/yd) Discharge Instruction: Secure with tape as directed. Compression Wrap Kerlix Roll 4.5x3.1 (in/yd) Discharge Instruction: Apply Kerlix and Coban compression as directed. 51M ACE Elastic Bandage With VELCRO Brand Closure, 4 (in) Compression Stockings Add-Ons Electronic Signature(s) Signed: 12/07/2021 12:19:15 PM By: Blanche East RN Signed: 12/07/2021 4:56:45 PM  By: [name] RN, BSN Entered By: Blanche East on 12/07/2021 09:01:20 -------------------------------------------------------------------------------- Vitals Details Patient Name: Date of Service: [name], [name] 12/07/2021 9:00 A M Medical Record Number: [id number] Patient Account Number: [id number] Date of Birth/Sex: Treating RN: 1985-04-12 (36 y.o. [name] Primary Care Emmajean Ratledge: [name] Other Clinician: Referring Quanah Majka: Treating Audie Wieser/Extender: [name] in Treatment: 110 Vital  Signs Time Taken: 08:43 Temperature (F): 98.1 Height (in): 77 Pulse (bpm): 87 Weight (lbs): 280 Respiratory Rate (breaths/min): 18 Body Mass Index (BMI): 33.2 Blood Pressure (mmHg): 148/91 Reference Range: 80 - 120 mg / dl Electronic Signature(s) Signed: 12/07/2021 4:56:45 PM By: [name] RN, BSN Entered By: [name] on 12/07/2021 08:45:04

## 2021-12-07 NOTE — Progress Notes (Signed)
[name] ([id number]) Visit Report for 12/07/2021 Chief Complaint Document Details Patient Name: Date of Service: [name] D 12/07/2021 9:00 A M Medical Record Number: [id number] Patient Account Number: [id number] Date of Birth/Sex: Treating RN: 03-15-1986 (36 y.o. M) Primary Care Provider: [name] Other Clinician: Referring Provider: Treating Provider/Extender: [name] in Treatment: 110 Information Obtained from: Patient Chief Complaint 01/11/2019; patient is here for review of a rather substantial wound over the left fifth plantar metatarsal head extending into the lateral part of his foot 10/24/2019; patient returns to clinic with wounds on his bilateral feet with underlying osteomyelitis biopsy-proven Electronic Signature(s) Signed: 12/07/2021 9:19:16 AM By: [name] MD FACS Entered By: [name] on 12/07/2021 09:19:16 -------------------------------------------------------------------------------- Debridement Details Patient Name: Date of Service: [name], [name] 12/07/2021 9:00 A M Medical Record Number: [id number] Patient Account Number: [id number] Date of Birth/Sex: Treating RN: 1985/09/01 (36 y.o. [name] Primary Care Provider: [name] Other Clinician: Referring Provider: Treating Provider/Extender: [name] in Treatment: 110 Debridement Performed for Assessment: Wound #8 Right,Lateral Foot Performed By: Physician [name], MD Debridement Type: Debridement Severity of Tissue Pre Debridement: Fat layer exposed Level of Consciousness (Pre-procedure): Awake and Alert Pre-procedure Verification/Time Out Yes - 09:11 Taken: Start Time: 09:12 T Area Debrided (L x W): otal 4 (cm) x 3.2 (cm) = 12.8 (cm) Tissue and other material debrided: Non-Viable, Callus, Slough, Skin: Epidermis, Slough Level: Skin/Epidermis Debridement Description: Selective/Open  Wound Instrument: Curette Bleeding: Minimum Hemostasis Achieved: Pressure Procedural Pain: 0 Post Procedural Pain: 0 Response to Treatment: Procedure was tolerated well Level of Consciousness (Post- Awake and Alert procedure): Post Debridement Measurements of Total Wound Length: (cm) 4 Width: (cm) 3.2 Depth: (cm) 0.1 Volume: (cm) 1.005 Character of Wound/Ulcer Post Debridement: Requires Further Debridement Severity of Tissue Post Debridement: Fat layer exposed Post Procedure Diagnosis Same as Pre-procedure Electronic Signature(s) Signed: 12/07/2021 10:10:13 AM By: [name] MD FACS Signed: 12/07/2021 4:56:45 PM By: [name] RN, BSN Entered By: [name] on 12/07/2021 09:13:15 -------------------------------------------------------------------------------- Debridement Details Patient Name: Date of Service: [name], [name] 12/07/2021 9:00 A M Medical Record Number: [id number] Patient Account Number: [id number] Date of Birth/Sex: Treating RN: [birth year] (36 y.o. [name] Primary Care Provider: [name] Other Clinician: Referring Provider: Treating Provider/Extender: [name] in Treatment: 110 Debridement Performed for Assessment: Wound #3 Left,Lateral,Plantar Foot Performed By: Physician [name], MD Debridement Type: Debridement Severity of Tissue Pre Debridement: Fat layer exposed Level of Consciousness (Pre-procedure): Awake and Alert Pre-procedure Verification/Time Out Yes - 09:11 Taken: Start Time: 09:12 T Area Debrided (L x W): otal 3.5 (cm) x 4.2 (cm) = 14.7 (cm) Tissue and other material debrided: Non-Viable, Callus, Slough, Skin: Epidermis, Slough Level: Skin/Epidermis Debridement Description: Selective/Open Wound Instrument: Curette Bleeding: Minimum Hemostasis Achieved: Pressure Procedural Pain: 0 Post Procedural Pain: 0 Response to Treatment: Procedure was tolerated well Level of  Consciousness (Post- Awake and Alert procedure): Post Debridement Measurements of Total Wound Length: (cm) 3.5 Width: (cm) 4.2 Depth: (cm) 0.3 Volume: (cm) 3.464 Character of Wound/Ulcer Post Debridement: Requires Further Debridement Severity of Tissue Post Debridement: Fat layer exposed Post Procedure Diagnosis Same as Pre-procedure Electronic Signature(s) Signed: 12/07/2021 10:10:13 AM By: [name] MD FACS Signed: 12/07/2021 4:56:45 PM By: [name] RN, BSN Entered By: [name] on 12/07/2021 09:15:30 -------------------------------------------------------------------------------- Debridement Details Patient Name: Date of Service: [name], [name] 12/07/2021 9:00 A M Medical Record Number: [id number] Patient  Account Number: [id number] Date of Birth/Sex: Treating RN: 1985/12/07 (36 y.o. [name] Primary Care Provider: Other Clinician: [name] Referring Provider: Treating Provider/Extender: [name] in Treatment: 110 Debridement Performed for Assessment: Wound #11 [name] Performed By: Physician [name], MD Debridement Type: Debridement Severity of Tissue Pre Debridement: Fat layer exposed Level of Consciousness (Pre-procedure): Awake and Alert Pre-procedure Verification/Time Out Yes - 09:11 Taken: Start Time: 09:12 T Area Debrided (L x W): otal 0.5 (cm) x 0.5 (cm) = 0.25 (cm) Tissue and other material debrided: Non-Viable, Eschar, Slough, Slough Level: Non-Viable Tissue Debridement Description: Selective/Open Wound Instrument: Curette Bleeding: Minimum Hemostasis Achieved: Pressure Procedural Pain: 0 Post Procedural Pain: 0 Response to Treatment: Procedure was tolerated well Level of Consciousness (Post- Awake and Alert procedure): Post Debridement Measurements of Total Wound Length: (cm) 0.5 Width: (cm) 0.5 Depth: (cm) 0.1 Volume: (cm) 0.02 Character of Wound/Ulcer Post  Debridement: Requires Further Debridement Severity of Tissue Post Debridement: Fat layer exposed Post Procedure Diagnosis Same as Pre-procedure Electronic Signature(s) Signed: 12/07/2021 10:10:13 AM By: [name] MD FACS Signed: 12/07/2021 4:56:45 PM By: [name] RN, BSN Entered By: [name] on 12/07/2021 09:16:16 -------------------------------------------------------------------------------- HPI Details Patient Name: Date of Service: [name], [name] 12/07/2021 9:00 A M Medical Record Number: [id number] Patient Account Number: [id number] Date of Birth/Sex: Treating RN: 11-Jun-1985 (36 y.o. 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 He comes in for review of a wound over the plantar fifth metatarsal head extending into the lateral part of the foot. He was followed for this previously by his podiatrist Dr. Cornelius Moras. As the patient tells his story he went to see podiatry first for a swelling he developed on the lateral part of his fifth metatarsal head in May. He states this was "open" by podiatry and the area closed. He was followed up in June and it was again opened callus removed and it closed promptly. There were plans being made for surgery on the fifth metatarsal head in June however his blood sugar was apparently too high for anesthesia. Apparently the area was debrided and opened again in June and it is never closed since. Looking over the records from podiatry I am really not able to follow this. It was clear when he was first seen it was before 5/14 at that point he already had a wound. By 5/17 the ulcer was resolved. I do not see anything about a procedure. On 5/28 noted to have pre-ulcerative moderate  keratosis. X-ray noted 1/5 contracted toe and tailor's bunion and metatarsal deformity. On a visit date on 09/28/2018 the dorsal part of the left foot it healed and resolved. There was concern about swelling in his lower extremity he was sent to the ER.. As far as I can tell he was seen in the ER on 7/12 with an ulcer on his left foot. A DVT rule out of the left leg was negative. I do not think I have complete records from podiatry but I am not able to verify the procedures this patient states he had. He states after the last procedure the wound has never closed although I am not able to follow this in the records I have from podiatry. He has not had a recent x-ray The patient has been  using Neosporin on the wound. He is wearing a Darco shoe. He is still very active up on his foot working and exercising. Past medical history; type 2 diabetes ketosis-prone, leg swelling with a negative DVT study in July. Non-smoker ABI in our clinic was 0.85 on the left 10/16; substantial wound on the plantar left fifth met head extending laterally almost to the dorsal fifth MTP. We have been using silver alginate we gave him a Darco forefoot off loader. An x-ray did not show evidence of osteomyelitis did note soft tissue emphysema which I think was due to gas tracking through an open wound. There is no doubt in my mind he requires an MRI 10/23; MRI not booked until 3 November at the earliest this is largely due to his glucose sensor in the right arm. We have been using silver alginate. There has been an improvement 10/29; I am still not exactly sure when his MRI is booked for. He says it is the third but it is the 10th in epic. This definitely needs to be done. He is running a low-grade fever today but no other symptoms. No real improvement in the 1 02/26/2019 patient presents today for a follow-up visit here in our clinic he is last been seen in the clinic on October 29. Subsequently we were working on getting MRI  to evaluate and see what exactly was going on and where we would need to go from the standpoint of whether or not he had osteomyelitis and again what treatments were going be required. Subsequently the patient ended up being admitted to the hospital on 02/07/2019 and was discharged on 02/14/2019. This is a somewhat interesting admission with a discharge diagnosis of pneumonia due to COVID-19 although he was positive for COVID-19 when tested at the urgent care but negative x2 when he was actually in the hospital. With that being said he did have acute respiratory failure with hypoxia and it was noted he also have a left foot ulceration with osteomyelitis. With that being said he did require oxygen for his pneumonia and I level 4 L. He was placed on antivirals and steroids for the COVID-19. He was also transferred to the [facility] at one point. Nonetheless he did subsequently discharged home and since being home has done much better in that regard. The CT angiogram did not show any pulmonary embolism. With regard to the osteomyelitis the patient was placed on vancomycin and Zosyn while in the hospital but has been changed to Augmentin at discharge. It was also recommended that he follow- up with wound care and podiatry. Podiatry however wanted him to see [name] according to the patient prior to them doing anything further. His hemoglobin A1c was 9.9 as noted in the hospital. Have an MRI of the left foot performed while in the hospital on 02/04/2019. 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 There is an overlying plantar open wound noted an abscess tracking back along the lateral aspect of the fifth metatarsal shaft. There is otherwise diffuse cellulitis and mild fasciitis without findings of polymyositis. The patient did have recently pneumonia secondary to COVID-19 I looked in the chart through epic and  it does appear that the patient may need to have an additional x-ray just to ensure everything is cleared and that he has no airspace disease prior to putting him into the [name]. 03/05/2019; patient was readmitted to the clinic last week.  He was hospitalized twice for a viral upper respiratory tract infection from 11/1 through 11/4 and then 11/5 through 11/12 ultimately this turned out to be Covid pneumonitis. Although he was discharged on oxygen he is not using it. He says he feels fine. He has no exercise limitation no cough no sputum. His O2 sat in our clinic today was 100% on room air. 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 He received Vanco and Zosyn in the hospital and then was discharged on 2 weeks of Augmentin. I do not see any relevant cultures. He was supposed to follow-up with infectious disease but I do not see that he has an appointment. 12/8; patient saw Dr. Novella Olive of infectious disease last week. He felt that he had had adequate antibiotic therapy. He did not go to follow-up with Dr. Amalia Hailey of podiatry and I have again talked to him about the pros and cons of this. He does not want to consider a ray amputation of this time. He is aware of the risks of recurrence, migration etc. He started HBO today and tolerated this well. He can complete the Augmentin that I gave him last week. I have looked over the lab work that Dr. Chana Bode ordered his C-reactive protein was 3.3 and his sedimentation rate was 17. The C-reactive protein is never really been measurably that high in this patient 12/15; not much change in the wound today however he has undermining along the lateral part of the foot again more extensively than last week. He has some rims of epithelialization. We have been using silver alginate. He is undergoing hyperbarics but did not dive today 12/18; in for his  obligatory first total contact cast change. Unfortunately there was pus coming from the undermining area around his fifth metatarsal head. This was cultured but will preclude reapplication of a cast. He is seen in conjunction with HBO 12/24; patient had staph lugdunensis in the wound in the undermining area laterally last time. We put him on doxycycline which should have covered this. The wound looks better today. I am going to give him another week of doxycycline before reattempting the total contact cast 12/31; the patient is completing antibiotics. Hemorrhagic debris in the distal part of the wound with some undermining distally. He also had hyper granulation. Extensive debridement with a #5 curette. The infected area that was on the lateral part of the fifth met head is closed over. I do not think he needs any more antibiotics. Patient was seen prior to HBO. Preparations for a total contact cast were made in the cast will be placed post hyperbarics 04/11/19; once again the patient arrives today without complaint. He had been in a cast all week noted that he had heavy drainage this week. This resulted in large raised areas of macerated tissue around the wound 1/14; wound bed looks better slightly smaller. Hydrofera Blue has been changing himself. He had a heavy drainage last week which caused a lot of maceration around the wound so I took him out of a total contact cast he says the drainage is actually better this week He is seen today in conjunction with HBO 1/21; returns to clinic. He was up in [place] for a day or 2 attending a funeral. He comes back in with the wound larger and with a large area of exposed bone. He had osteomyelitis and septic arthritis of the fifth  left metatarsal head while he was in hospital. He received IV antibiotics in the hospital for a prolonged period of time then 3 weeks of Augmentin. Subsequently I gave him 2 weeks of doxycycline for more superficial wound infection.  When I saw this last week the wound was smaller the surface of the wound looks satisfactory. 1/28; patient missed hyperbarics today. Bone biopsy I did last time showed Enterococcus faecalis and Staphylococcus lugdunensis . He has a wide area of exposed bone. We are going to use silver alginate as of today. I had another ethical discussion with the patient. 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 In this situation I think the likelihood of healing this is low. Therefore I have recommended a ray amputation and with the patient's agreement I have referred him to Dr. Doran Durand. The other issue is that his compliance with hyperbarics has been minimal because of his work schedule and given his underlying decision I am going to stop this today READMISSION 10/24/2019 MRI 09/29/2019 left foot IMPRESSION: 1. Apparent skin ulceration inferior and lateral to the 5th metatarsal base with underlying heterogeneous T2 signal and enhancement in the subcutaneous fat. Small peripherally enhancing fluid collections along the plantar and lateral aspects of the 5th metatarsal base suspicious for abscesses. 2. Interval amputation through the mid 5th metatarsal with nonspecific low-level marrow edema and enhancement. Given the proximity to the adjacent soft tissue inflammatory changes, osteomyelitis cannot be excluded. 3. The additional bones appear unremarkable. MRI 09/29/2019 right foot IMPRESSION: 1. Soft tissue ulceration lateral to the 5th MTP joint. There is low-level T2 hyperintensity within the 4th and 5th metatarsal heads and adjacent proximal phalanges without abnormal T1 signal or cortical destruction. These findings are nonspecific and could be seen with early marrow edema, hyperemia or early osteomyelitis. No evidence of septic joint. 2. Mild tenosynovitis and synovial enhancement associated with the extensor digitorum tendons at the level of the midfoot. 3.  Diffuse low-level muscular T2 hyperintensity and enhancement, most consistent with diabetic myopathy. LEFT FOOT BONE Methicillin resistant staphylococcus aureus Staphylococcus lugdunensis MIC MIC CIPROFLOXACIN >=8 RESISTANT Resistant <=0.5 SENSI... Sensitive CLINDAMYCIN <=0.25 SENS... Sensitive >=8 RESISTANT Resistant ERYTHROMYCIN >=8 RESISTANT Resistant >=8 RESISTANT Resistant GENTAMICIN <=0.5 SENSI... Sensitive <=0.5 SENSI... Sensitive Inducible Clindamycin NEGATIVE Sensitive NEGATIVE Sensitive OXACILLIN >=4 RESISTANT Resistant 2 SENSITIVE Sensitive RIFAMPIN <=0.5 SENSI... Sensitive <=0.5 SENSI... Sensitive TETRACYCLINE <=1 SENSITIVE Sensitive <=1 SENSITIVE Sensitive TRIMETH/SULFA <=10 SENSIT Sensitive <=10 SENSIT Sensitive ... Marland Kitchen.. VANCOMYCIN 1 SENSITIVE Sensitive <=0.5 SENSI... Sensitive Right foot bone . Component 3 wk ago Specimen Description BONE Special Requests RIGHT 4 METATARSAL SAMPLE B Gram Stain NO WBC SEEN NO ORGANISMS SEEN Culture RARE METHICILLIN RESISTANT STAPHYLOCOCCUS AUREUS NO ANAEROBES ISOLATED Performed at [facility], Organ [address], [place], [place] [address] Report Status 10/08/2019 FINAL Organism ID, Bacteria METHICILLIN RESISTANT STAPHYLOCOCCUS AUREUS Resulting Agency [facility] Susceptibility Methicillin resistant staphylococcus aureus MIC CIPROFLOXACIN >=8 RESISTANT Resistant CLINDAMYCIN <=0.25 SENS... Sensitive ERYTHROMYCIN >=8 RESISTANT Resistant GENTAMICIN <=0.5 SENSI... Sensitive Inducible Clindamycin NEGATIVE Sensitive OXACILLIN >=4 RESISTANT Resistant RIFAMPIN <=0.5 SENSI... Sensitive TETRACYCLINE <=1 SENSITIVE Sensitive TRIMETH/SULFA <=10 SENSIT Sensitive ... VANCOMYCIN 1 SENSITIVE Sensitive This is a patient we had in clinic earlier this year with a wound over his left fifth metatarsal head. 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 In any case he developed recurrent osteomyelitis and  elected for a left fifth ray amputation which was done by Dr. Doran Durand on 05/16/2019. He seems to have developed problems with wounds on his bilateral feet in June 2021 although he may have had problems earlier than this. He was in an urgent care with a right foot ulcer on 09/26/2019 and given a course of doxycycline. This was apparently after having trouble getting into see orthopedics. He was seen by podiatry on 09/28/2019 noted to have bilateral lower extremity ulcers including the left lateral fifth metatarsal base and the right subfifth met head. It was noted that had purulent drainage at that time. He required hospitalization from 6/20 through 7/2. This was because of worsening right foot wounds. He underwent bilateral operative incision and drainage and bone biopsies bilaterally. Culture results are listed above. He has been referred back to clinic by Dr. Jacqualyn Posey of podiatry. He is also followed by Dr. Megan Salon who saw him yesterday. He was discharged from hospital on Zyvox Flagyl and Levaquin and yesterday changed to doxycycline Flagyl and Levaquin. His inflammatory markers on 6/26 showed a sedimentation rate of 129 and a C-reactive protein of 5. This is improved to 14 and 1.3 respectively. This would indicate improvement. ABIs in our clinic today were 1.23 on the right and 1.20 on the left 11/01/2019 on evaluation today patient appears to be doing fairly well in regard to the wounds on his feet at this point. Fortunately there is no signs of active infection at this time. No fevers, chills, nausea, vomiting, or diarrhea. He currently is seeing infectious disease and still under their care at this point. Subsequently he also has both wounds which she has not been using collagen on as he did not receive that in his packaging he did not call us and let us know that.  Apparently that just was missed on the order. Nonetheless we will get that straightened out today. 8/9-Patient returns for bilateral foot wounds, using Prisma with hydrogel moistened dressings, and the wounds appear stable. Patient using surgical shoes, avoiding much pressure or weightbearing as much as possible 8/16; patient has bilateral foot wounds. 1 on the right lateral foot proximally the other is on the left mid lateral foot. Both required debridement of callus and thick skin around the wounds. We have been using silver collagen 8/27; patient has bilateral lateral foot wounds. The area on the left substantially surrounded by callus and dry skin. This was removed from the wound edge. The underlying wound is small. The area on the right measured somewhat smaller today. 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 Unfortunately I did not update his antibiotics during today's visit. 9/10 I reviewed Dr. Hale Bogus last notes he felt he had completed antibiotics his inflammatory markers were reasonably well controlled. He has a small wound on the lateral left foot and a tiny area on the right which is just above closed. He is using Hydrofera Blue with border foam he has bilateral surgical shoes 9/24; 2 week f/u. doing well. right foot is closed. left foot still undermined. 10/14; right foot remains closed at the fifth met head. The area over the base of the left fifth metatarsal has a small open area but considerable undermining towards the plantar foot. Thick callus skin around this suggests an adequate pressure relief. We have talked about this. He says he is going to go back into his cam boot. I suggested a total contact cast he did not seem enamored  with this suggestion 10/26; left foot base of the fifth metatarsal. Same condition as last time. He has skin over the area with an open wound however the skin is not adherent. He went  to see Dr. Earleen Newport who did an x-ray and culture of his foot I have not reviewed the x-ray but the patient was not told anything. He is on doxycycline 11/11; since the patient was last here he was in the emergency room on 10/30 he was concerned about swelling in the left foot. They did not do any cultures or x-rays. They changed his antibiotics to cephalexin. Previous culture showed group B strep. The cephalexin is appropriate as doxycycline has less than predictable coverage. Arrives in clinic today with swelling over this area under the wound. He also has a new wound on the right fifth metatarsal head 11/18; the patient has a difficult wound on the lateral aspect of the left fifth metatarsal head. The wound was almost ballotable last week I opened it slightly expecting to see purulence however there was just bleeding. I cultured this this was negative. X-ray unchanged. We are trying to get an MRI but I am not sure were going to be able to get this through his insurance. He also has an area on the right lateral fifth metatarsal head this looks healthier 12/3; the patient finally got our MRI. Surprisingly this did not show osteomyelitis. I did show the soft tissue ulceration at the lateral plantar aspect of the fifth metatarsal base with a tiny residual 6 mm abscess overlying the superficial fascia I have tried to culture this area I have not been able to get this to grow anything. Nevertheless the protruding tissue looks aggravated. I suspect we should try to treat the underlying "abscess with broad-spectrum antibiotics. I am going to start him on Levaquin and Flagyl. He has much less edema in his legs and I am going to continue to wrap his legs and see him weekly 12/10. I started Levaquin and Flagyl on him last week. He just picked up the Flagyl apparently there was some delay. The worry is the wound on the left fifth metatarsal base which is substantial and worsening. His foot looks like he inverts at  the ankle making this a weightbearing surface. Certainly no improvement in fact I think the measurements of this are somewhat worse. We have been using 12/17; he apparently just got the Levaquin yesterday this is 2 weeks after the fact. He has completed the Flagyl. The area over the left fifth metatarsal base still has protruding granulation tissue although it does not look quite as bad as it did some weeks ago. He has severe bilateral lymphedema although we have not been treating him for wounds on his legs this is definitely going to require compression. There was so much edema in the left I did not wish to put him in a total contact cast today. I am going to increase his compression from 3-4 layer. The area on the right lateral fifth met head actually look quite good and superficial. 12/23; patient arrived with callus on the right fifth met head and the substantial hyper granulated callused wound on the base of his fifth metatarsal. He says he is completing his Levaquin in 2 days but I do not think that adds up with what I gave him but I will have to double check this. We are using Hydrofera Blue on both areas. My plan is to put the left leg in a cast the  week after New Year's 04/06/2020; patient's wounds about the same. Right lateral fifth metatarsal head and left lateral foot over the base of the fifth metatarsal. There is undermining on the left lateral foot which I removed before application of total contact cast continuing with Hydrofera Blue new. Patient tells me he was seen by endocrinology today lab work was done [Dr. Kerr]. Also wondering whether he was referred to cardiology. I went over some lab work from previously does not have chronic renal failure certainly not nephrotic range proteinuria he does have very poorly controlled diabetes but this is not his most updated lab work. Hemoglobin A1c has been over 11 1/10; the patient had a considerable amount of leakage towards mid part of his  left foot with macerated skin however the wound surface looks better the area on the right lateral fifth met head is better as well. I am going to change the dressing on the left foot under the total contact cast to silver alginate, continue with Hydrofera Blue on the right. 1/20; patient was in the total contact cast for 10 days. Considerable amount of drainage although the skin around the wound does not look too bad on the left foot. The area on the right fifth metatarsal head is closed. Our nursing staff reports large amount of drainage out of the left lateral foot wound 1/25; continues with copious amounts of drainage described by our intake staff. PCR culture I did last week showed E. coli and Enterococcus faecalis and low quantities. Multiple resistance genes documented including extended spectrum beta lactamase, MRSA, MRSE, quinolone, tetracycline. The wound is not quite as good this week as it was 5 days ago but about the same size 2/3; continues with copious amounts of malodorous drainage per our intake nurse. The PCR culture I did 2 weeks ago showed E. coli and low quantities of Enterococcus. There were multiple resistance genes detected. I put Neosporin on him last week although this does not seem to have helped. The wound is slightly deeper today. Offloading continues to be an issue here although with the amount of drainage she has a total contact cast is just not going to work 2/10; moderate amount of drainage. Patient reports he cannot get his stocking on over the dressing. I told him we have to do that the nurse gave him suggestions on how to make this work. The wound is on the bottom and lateral part of his left foot. Is cultured predominantly grew low amounts of Enterococcus, E. coli and anaerobes. There were multiple resistance genes detected including extended spectrum beta lactamase, quinolone, tetracycline. I could not think of an easy oral combination to address this so for now I  am going to do topical antibiotics provided by [facility] I think the main agents here are vancomycin and an aminoglycoside. We have to be able to give him access to the wounds to get the topical antibiotic on 2/17; moderate amount of drainage this is unchanged. He has his Keystone topical antibiotic against the deep tissue culture organisms. He has been using this and changing the dressing daily. Silver alginate on the wound surface. 2/24; using Keystone antibiotic with silver alginate on the top. He had too much drainage for a total contact cast at one point although I think that is improving and I think in the next week or 2 it might be possible to replace a total contact cast I did not do this today. In general the wound surface looks healthy however he continues  to have thick rims of skin and subcutaneous tissue around the wide area of the circumference which I debrided 06/04/2020 upon evaluation today patient appears to be doing well in regard to his wound. I do feel like he is showing signs of improvement. There is little bit of callus and dead tissue around the edges of the wound as well as what appears to be a little bit of a sinus tract that is off to the side laterally I would perform debridement to clear that away today. 3/17; left lateral foot. The wound looks about the same as I remember. Not much depth surface looks healthy. No evidence of infection 3/25; left lateral foot. Wound surface looks about the same. Separating epithelium from the circumference. There really is no evidence of infection here however not making progress by my view 3/29; left lateral foot. Surface of the wound again looks reasonably healthy still thick skin and subcutaneous tissue around the wound margins. There is no evidence of infection. One of the concerns being brought up by the nurses has again the amount of drainage vis--vis continued use of a total contact cast 4/5; left lateral foot at roughly the base of  the fifth metatarsal. Nice healthy looking granulated tissue with rims of epithelialization. The overall wound measurements are not any better but the tissue looks healthy. The only concern is the amount of drainage although he has no surrounding maceration with what we have been doing recently to absorb fluid and protect his skin. He also has lymphedema. 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 He states that the kerlix/coban under the TCC rolled down and could not get it back up. He has been using an offloading scooter and has somehow hurt his right foot using this device. This happened last week. He states that the side of his right foot developed a blister and opened. The top of his foot also has a few small open wounds he thinks is due to his socks rubbing in his shoes. He has not been using any dressings to the wound. He denies purulent drainage, fever/chills or erythema to the wounds. 4/22; patient presents for 1 week follow-up. He developed new wounds to the right foot that were evaluated at last clinic visit. He continues to have a total contact cast to the left leg and he reports no issues. He has been using silver collagen to the right foot wounds with no issues. He denies purulent drainage, fever/chills or erythema to the right foot wounds. He has no complaints today 4/25; patient presents for 1 week follow-up. He has a total contact cast of the left leg and reports no issues. He has been using silver alginate to the right foot wound. He denies purulent drainage, fever/chills or erythema to the right foot wounds. 5/2 patient presents for 1 week follow-up. T contact cast on the left. The wound which is on the base of the plantar foot at the base  of the fifth metatarsal otal actually looks quite good and dimensions continue to gradually contract. HOWEVER the area on the right lateral fifth metatarsal head is much larger than what I remember from 2 weeks ago. Once more is he has significant levels of hypergranulation. Noteworthy that he had  this same hyper granulated response on his wound on the left foot at one point in time. So much so that he I thought there was an underlying fluid collection. Based on this I think this just needs debridement. 5/9; the wound on the left actually continues to be gradually smaller with a healthy surface. Slight amount of drainage and maceration of the skin around but not too bad. However he has a large wound over the right fifth metatarsal head very much in the same configuration as his left foot wound was initially. I used silver nitrate to address the hyper granulated tissue no mechanical debridement 5/16; area on the left foot did not look as healthy this week deeper thick surrounding macerated skin and subcutaneous tissue. The area on the right foot fifth met head was about the same The area on the right ankle that we identified last week is completely broken down into an open wound presumably a stocking rubbing issue 5/23; patient has been using a total contact cast to the left side. He has been using silver alginate underneath. He has also been using silver alginate to the right foot wounds. He has no complaints today. He denies any signs of infection. 5/31; the left-sided wound looks some better measure smaller surface granulation looks better. We have been using silver alginate under the total contact cast The large area on his right fifth met head and right dorsal foot look about the same still using silver alginate 6/6; neither side is good as I was hoping although the surface area dimensions are better. A lot of maceration on his left and right foot around the wound edge. Area on the dorsal right  foot looks better. He says he was traveling. I am not sure what does the amount of maceration around the plantar wounds may be drainage issues 6/13; in general the wound surfaces look quite good on both sides. Macerated skin and raised edges around the wound required debridement although in general especially on the left the surface area seems improved. The area on the right dorsal ankle is about the same I thought this would not be such a problem to close 6/20; not much change in either wound although the one on the right looks a little better. Both wounds have thick macerated edges to the skin requiring debridements. We have been using silver alginate. The area on the dorsal right ankle is still open I thought this would be closed. 6/28; patient comes in today with a marked deterioration in the right foot wound fifth met head. Wide area of exposed bone this is a drastic change from last time. The area on the left there we have been casting is stagnant. We have been using silver alginate in both wound areas. 7/5; bone culture I did for PCR last time was positive for Pseudomonas, group B strep, Enterococcus and Staph aureus. There was no suggestion of methicillin resistance or ampicillin resistant genes. This was resistant to tetracycline however He comes into the clinic today with the area over his right plantar fifth metatarsal head which had been doing so well 2 weeks ago completely necrotic feeling bone. I do not know that this is going to be salvageable. The left foot wound is certainly no smaller but it has a better surface and is superficial. 7/8; patient called in this morning to say that his total contact cast was rubbing against his foot. He states he is doing fine overall. He denies signs of infection. 7/12; continued deterioration  in the wound over the right fifth metatarsal head crumbling bone. This is not going to be salvageable. The patient agrees and wants to be referred to Dr. Doran Durand  which we will attempt to arrange as soon as possible. I am going to continue him on antibiotics as long as that takes so I will renew those today. The area on the left foot which is the base of the fifth metatarsal continues to look somewhat better. Healthy looking tissue no depth no debridement is necessary here. 7/20; the patient was kindly seen by Dr. Doran Durand of orthopedics on 10/19/2020. 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 Towards this end we have taken him out of the total contact cast on the left we will put him in a wrap with Hydrofera Blue. As I understand things surgery is planned for 7/21 7/27; patient had his surgery last Thursday. 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 Apparently everything went well we did not still disturb that today The area on the left foot actually looks quite good. He has been much less mobile which probably explains this he did not seem to do well in the total contact cast secondary to drainage and maceration I think. 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 Fortunately there is no evidence of active infection at this time. No fevers, chills, nausea, vomiting, or diarrhea. Overall I think that he is actually doing extremely well. Nonetheless I do believe that he is staying off of this more following the surgery in his right foot that is the reason the left is doing so great. 8/16; left plantar foot wound. This looks smaller than the last time I saw this he is using Hydrofera Blue. The surgical wound on the right foot is being followed by Dr. Doran Durand we did not look at this today. He has surgical shoes on both feet 8/23; left plantar foot wound not as good this week. Surrounding macerated skin and subcutaneous tissue everything looks moist and wet. I do not  think he is offloading this adequately. He is using a surgical shoe Apparently the right foot surgical wound is not open although I did not check his foot 8/31; left plantar foot lateral aspect. Much improved this week. He has no maceration. Some improvement in the surface area of the wound but most impressively the depth is come in we are using silver alginate. The patient is a Product/process development scientist. He is asked that we write him a letter so he can go back to work. I have also tried to see if we can write something that will allow him to limit the amount of time that he is on his foot at work. 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 Hopefully the latter can be avoided 9/6; I believe the patient missed an appointment last week. He arrives in today with a wound looking roughly the same certainly no better. Undermining laterally and also inferiorly. We used molecuLight today in training with the patient's permission.. We are using silver alginate 9/21 wound is measuring bigger this week although this may have to do with the aggressive circumferential debridement last week in response to the blush fluorescence on  the MolecuLight. Culture I did last week showed significant MSSA and E. coli. I put him on Augmentin but he has not started it yet. We are also going to send this for compounded antibiotics at [facility]. There is no evidence of systemic infection 9/29; silver alginate. His Keystone arrived. He is completing Augmentin in 2 days. Offloading in a cam boot. Moderate drainage per our intake staff 10/5; using silver alginate. He has been using his [place]. He has completed his Augmentin. Per our intake nurse still a lot of drainage, far too much to consider a total contact cast. Wound measures about the same. He had the same undermining area that I defined last week from a roughly 11-3. I remove this today 10/12;  using silver alginate he is using the [place]. He comes in for a nurse visit hence we are applying [employer] twice a week. Measuring slightly better today and less notable drainage. Extensive debridement of the wound edge last time 10/18; using topical Keystone and silver alginate and a soft cast. Wound measurements about the same. Drainage was through his soft cast. We are changing this twice a week Tuesdays and Friday 10/25; comes in with moderate drainage. Still using Keystone silver alginate and a soft cast. Wound dimensions completely the same.He has a lot of edema in the left leg he has lymphedema. Asking for [name] to consider wrapping him as he cannot get his stocking on over the soft cast 11/2; comes in with moderate to large drainage slightly smaller in terms of width we have been using [place]. His wound looks satisfactory but not much improvement 11/4; patient presents today for obligatory cast change. Has no issues or complaints today. He denies signs of infection. 11/9; patient traveled this weekend to [place], was on the cast quite a bit. Staining of the cast with black material from his walking boot. Drainage was not quite as bad as we feared. Using silver alginate and Keystone 11/16; we do not have size for cast therefore we have been putting a soft cast on him since the change on Friday. Still a significant amount of drainage necessitating changing twice a week. We have been using the Keystone at cast changes either hard or soft as well as silver alginate Comes in the clinic with things actually looking fairly good improvement in width. He says his offloading is about the same 02/24/2021 upon evaluation today patient actually comes back in and is doing excellent in regard to his foot ulcer this is significantly smaller even compared to the last visit. The soft cast seems to have done extremely well for him which is great news. I do not see any signs of infection minimal debridement will  be needed today. 11/30; left lateral foot much improved half a centimeter improvement in surface area. No evidence of infection. He seems to be doing better with the soft cast in the TCC therefore we will continue with this. He comes back in later in the week for a change with the nurses. This is due to drainage 12/6; no improvement in dimensions. Under illumination some debris on the surface we have been using silver alginate, soft cast. If there is anything optimistic here he seems to have have less drainage 12/13. Dimensions are improved both length and width and slightly in depth. Appears to be quite healthy today. Raised edges of this thick skin and callus around the edges however. He is in a soft cast were bringing him back once for a change on Friday. Drainage  is better 12/20. Dimensions are improved. He still has raised edges of thick skin and callus around the edges. We are using a soft cast 12/28; comes in today with thick callus around the wound. Using silver under alginate under a soft cast. I do not think there is much improvement in any measurement 2023 04/06/2021; patient was put in a total contact cast. Unfortunately not much change in surface area 1/10; not much different still thick callus and skin around the edge in spite of the total contact cast. This was just debrided last week we have been using the [facility] compounded antibiotic and silver alginate under a total contact cast 1/18 the patient's wound on the left side is doing nicely. smaller HOWEVER he comes in today with a wound on the right foot laterally. blister most likely serosangquenous drainage 1/24; the patient continues to do well in terms of the plantar left foot which is continued to contract using silver alginate under the total contact cast HOWEVER the right lateral foot is bigger with denuded skin around the edges. I used pickups and a #15 scalpel to remove this this looks like the remanence of a large blister.  Cannot rule out infection. Culture in this area I did last week showed Staphylococcus lugdunensis few colonies. I am going to try to address this with his [employer] antibiotic that is done so well on the left having linezolid and this should cover the staph 2/1; the patient's wound on his left foot which was the original plantar foot wound thick skin and eschar around the edges even in the total contact cast but the wound surface does not look too bad The real problem is on how his right lateral foot at roughly the base of the fifth metatarsal. The wound is completely necrotic more worrisome than that there is swelling around the edges of this. We have been using silver alginate on both wounds and Keystone on the right foot. Unfortunately I think he is going to require systemic antibiotics while we await cultures. He did not get the x-ray done that we ordered last week [lost the prescription 2/7; disappointingly in the area on the left foot which we are treating with a total contact cast is still not closed although it is much smaller. He continues to have a lot of callus around the wound edge. -Right lateral foot culture I did last week was negative x-ray also negative for osteomyelitis. 2/15: TCC silver alginate on the left and silver alginate on the right lateral. No real improvement in either area 05/26/2021: T oday, the wounds are roughly the same size as at his previous visit, post-debridement. He continues to endorse fairly substantial drainage, particularly on the right. He has been in a total contact cast on the left. There is still some callus surrounding this lesion. On the right, the periwound skin is quite macerated, along with surrounding callus. The center of the right-sided wound also has some dark, densely adherent material, which is very difficult to remove. 06/02/2021: Today, both wounds are slightly smaller. He has been using zinc oxide ointment around the right ulcer and the degree of  maceration has improved markedly. There continues to be an area of nonviable tissue in the center of the right sided ulcer. The left-sided wound, which has been in the total contact cast. Appears clean and the degree of callus around it is less than previously. 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  He had to take the stairs and both wounds have increased in size. The left foot, which has been in a total contact cast, has developed a tunnel tracking to the lateral aspect of his foot. The nonviable tissue in the center of the right-sided ulcer remains recalcitrant to debridement. There is significant undermining surrounding the entirety of the left sided wound. 06/16/2021: The elevator at school has been fixed and the patient has been able to avoid putting as much weight on his wounds over the past week. We converted the left foot wound into a single lesion today, but despite this, the wound is actually smaller. The base is healthy with limited periwound callus. On the right, the central necrotic area is still present. He continues to be quite macerated around the right sided wound, despite applying barrier cream. This does, however, have the benefit of softening the callus to make it more easily removable. 06/23/2021: Today, the left wound is smaller. The lateral aspect that had opened up previously is now closed. The wound base has a healthy bed of granulation tissue and minimal slough. Unfortunately, on the right, the wound is larger and continues to be fairly macerated. He has also reopened the wound at his right ankle. He thinks this is due to the gait he has adopted secondary to his total contact cast and boot. 06/30/2021: T oday, both wounds are a little bit larger. The lateral aspect on the left has remained closed. He continues to have significant periwound maceration. The culture that I took from the right sided wound grew a  population of bacteria that is not covered by his current [facility] antibiotic. The center of the right- sided wound continues to appear necrotic with nonviable fat. It probes deeper today, but does not reach bone. 07/07/2021: The periwound maceration is a little bit less today. The right lateral foot wound has some areas that appear more viable and the necrotic center also looks a little bit better. The wound on the dorsal surface of his right foot near the ankle is contracting and the surface appears healthy. The left plantar wound surface looks healthy, but there is some new undermining on the medial portion. He did get his new Keystone antibiotic and began applying that to the right foot wound on Saturday. 07/14/2021: The intake nurse reported substantial drainage from his wounds, but the periwound skin actually looks better than is typical for him. The wound on the dorsal surface of his right foot near the ankle is smaller and just has a small open area underneath some dried eschar. The left plantar wound surface looks healthy and there has been no significant accumulation of callus. The right lateral foot wound looks quite a bit better, with the central portion, which typically appears necrotic, looking more viable albeit pale. 07/22/2021: His left foot is extremely macerated today. The wound is about the same size. The wound on the dorsal surface of his right foot near the ankle had closed, but he traumatized it removing the dressing and there is a tiny skin tear in that location. The right lateral foot wound is bigger, but the surface appears healthy. 07/30/2021: The wound on the dorsal surface of his right foot near the ankle is closed. The right lateral foot wound again is a little bit bigger due to some undermining. The periwound skin is in better condition, however. He has been applying zinc oxide. The wound surface is a little bit dry today. On the left, he does not have the  substantial  maceration that we frequently see. The wound itself is smaller and has a clean surface. 08/06/2021: Both wounds seem to have deteriorated over the past week. The right lateral foot wound has a dry surface but the periwound is boggy.. Overall wound dimensions are about the same. On the left, the wound is about the same size, but there is more undermining present underneath periwound callus. 08/13/2021: The right sided wound looks about the same, but on the left there has been substantial deterioration. The undermining continues to extend under periwound callus. Once this was removed, substantial extension of the wound was present. There is no odor or purulent drainage but clearly the wounds have broken down. 08/20/2021: The wounds look about the same today. He has been out of his total contact cast and has just been changing the dressings using topical Keystone with PolyMem Ag, Kerlix and Ace bandages. The wound on the top of his right ankle has reopened but this is quite small. There was a little bit of purulent material that I expressed when examining this wound. 08/24/2021: After the aggressive debridement I performed at his last visit, the wounds actually look a little bit better today. They are smaller with the exception of the wound on the top of his right ankle which is a little bit bigger as some more skin pulled off when he was changing his dressing. We are using topical Keystone with PolyMem Ag Kerlix and Ace bandages. 09/02/2021: There has been really no change to any of his wounds. 09/16/2021: The patient was hospitalized last week with nausea, vomiting, and dehydration. He says that while he was in the hospital, his wounds were not really addressed properly. Today, both plantar foot wounds are larger and the periwound skin is macerated. The wound on the dorsum of his right foot has a scab on the top. The right foot now has a crater where previously he had had nonviable fat. It looks as though this  simply died and fell out. The periwound callus is wet. 09/24/2021: His wounds have deteriorated somewhat since his last visit. The wound on the dorsum of his right foot near his ankle is larger and has more nonviable tissue present. The crater in his right foot is even deeper; I cannot quite palpate or probe to bone but I am sure it is close. The wound on his left plantar foot has an odd boggy area in the center that almost feels as though it has fluid within it. He has run out of his topical Keystone antibiotic. We are using silver alginate on his wounds. 09/29/2021: He has developed a new wound on the dorsum of his left foot near his ankle. He says he thinks his wrapping is rubbing in that site. I would concur with this as the wound on his right ankle is larger. The left foot looks about the same. The right foot has the crater that was present last week. No significant slough accumulation, but his foot remains quite swollen and warm despite oral antibiotic therapy. 10/08/2021: All of his wounds look about the same as last week. He did not start his oral antibiotics that are prescribed until just a couple of days ago; his [employer] compounded antibiotics formula has been changed and he is awaiting delivery of the new recipe. His MRI that was scheduled for earlier this week was canceled as no prior authorization had been obtained; unfortunately the tech responsible sent an email to my old [employer] email, which I no  longer use nor have access to. 10/18/2021: The wounds on his bilateral dorsal feet near the ankles are both improved. They are smaller and have just some eschar and slough buildup. The left plantar wound has a fair amount of undermining, but the surface is clean. There is some periwound callus accumulation. On the right plantar foot, there is nonviable fat leading to a deep tunnel that tracks towards his dorsal medial foot. There is periwound callus and slough accumulation, as well. His right  foot and leg remain swollen as compared to the left. 10/25/2021: The wounds on his bilateral dorsal feet and at the ankles have broken down somewhat. They are little bit larger than last week. The left plantar wound continues to undermine laterally but the surface is clean. The right plantar foot wound shows some decreased depth in the tunnel tracking towards his dorsal medial foot. He has not yet had the Doppler study that I ordered; it sounds like there is some confusion about the scheduling of the procedure. In addition, the MRI was denied and I have taken steps to appeal the denial. 11/24/2021: Since our last visit, Mr. Larin was admitted to the hospital where an MRI suggested osteomyelitis. He was taken the operating room by podiatry. Bone biopsies were negative for osteomyelitis. They debrided his wounds and applied myriad matrix. He saw them last week and they removed his staples. He is here today to continue his wound healing process. T oday, both of the dorsal foot/ankle wounds are substantially smaller. There is just a little eschar overlying the left sided wound and some eschar and slough on the right. The right plantar foot ulcer has the healthiest surface of granulation tissue that I have seen to date. A portion of the myriad matrix failed to take and was hanging loose. It appears that myriad morcells were placed into the tunnel closest to the dorsal portion of his foot. These have sloughed off. The left plantar foot ulcer is about the same size, but has a much healthier surface than in the past. Both plantar ulcers have callus and slough accumulation. 12/07/2021: Left dorsal foot/ankle wound is closed. The right dorsal foot/ankle wound is nearly closed and just has a small open area with some eschar and slough. The right plantar foot wound has contracted quite a bit since our last visit. It has a healthy surface with just a little bit of slough accumulation and periwound callus buildup.  The left plantar foot wound is about the same size but the surface appears healthy. There is a little slough and periwound callus on this side, as well. Electronic Signature(s) Signed: 12/07/2021 9:21:59 AM By: [name] MD FACS Entered By: [name] on 12/07/2021 09:21:59 -------------------------------------------------------------------------------- Physical Exam Details Patient Name: Date of Service: [name], [name] 12/07/2021 9:00 A M Medical Record Number: [id number] Patient Account Number: [id number] Date of Birth/Sex: Treating RN: Dec 03, 1985 (36 y.o. M) Primary Care Provider: [name] Other Clinician: Referring Provider: Treating Provider/Extender: [name] in Treatment: 110 Constitutional Hypertensive, asymptomatic. . . . No acute distress.Marland Kitchen Respiratory Normal work of breathing on room air.. Notes 12/07/2021: Left dorsal foot/ankle wound is closed. The right dorsal foot/ankle wound is nearly closed and just has a small open area with some eschar and slough. The right plantar foot wound has contracted quite a bit since our last visit. It has a healthy surface with just a little bit of slough accumulation and periwound callus buildup. The left plantar foot wound is  about the same size but the surface appears healthy. There is a little slough and periwound callus on this side, as well. Electronic Signature(s) Signed: 12/07/2021 9:22:30 AM By: [name] MD FACS Entered By: [name] on 12/07/2021 09:22:29 -------------------------------------------------------------------------------- Physician Orders Details Patient Name: Date of Service: [name], [name] 12/07/2021 9:00 A M Medical Record Number: [id number] Patient Account Number: [id number] Date of Birth/Sex: Treating RN: Oct 03, 1985 (36 y.o. 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 - Dr. [name] - Room 1 with [name] Return A Wednesday 12/15/21 at 08:15 AM ppointment in 2 weeks. - Dr. [name] - Room 1 with [name] Return A Wednesday 12/22/21 at 08:15 AM Return appointment in 3 weeks. - Dr. [name] - Room 1 with [facility] Wednesday 12/29/21 at 08:15 AM Bathing/ Shower/ Hygiene May shower and wash wound with soap and water. Edema Control - Lymphedema / SCD / Other Bilateral Lower Extremities Elevate legs to the level of the heart or above for 30 minutes daily and/or when sitting, a frequency of: - throughout the day Avoid standing for long periods of time. Exercise regularly Compression stocking or Garment 20-30 mm/Hg pressure to: - to both legs daily Off-Loading Open toe surgical shoe to: - Both feet Additional Orders / Instructions Follow Nutritious Diet Wound Treatment Wound #11 - Foot Wound Laterality: Right, Anterior Cleanser: Soap and Water 1 x Per Day/30 Days Discharge Instructions: May shower and wash wound with dial antibacterial soap and water prior to dressing change. Peri-Wound Care: Sween Lotion (Moisturizing lotion) 1 x Per Day/30 Days Discharge Instructions: Apply moisturizing lotion as directed Topical: keystone antibiotic compound 1 x Per Day/30 Days Prim Dressing: Promogran Prisma Matrix, 4.34 (sq in) (silver collagen) (Dispense As Written) 1 x Per Day/30 Days ary Discharge Instructions: Moisten collagen with saline or hydrogel Secondary Dressing: ABD Pad, 8x10 (Dispense As  Written) 1 x Per Day/30 Days Discharge Instructions: Apply over primary dressing as directed. Secondary Dressing: Optifoam Non-Adhesive Dressing, 4x4 in (Dispense As Written) 1 x Per Day/30 Days Discharge Instructions: Apply over primary dressing as directed. Secondary Dressing: NonWoven Sponge, 4x4 (in/in) (Generic) 1 x Per Day/30 Days Secured With: 49M Medipore H Soft Cloth Surgical T ape, 4 x 10 (in/yd) (Generic) 1 x Per Day/30 Days Discharge Instructions: Secure with tape as directed. Compression Wrap: Kerlix Roll 4.5x3.1 (in/yd) 1 x Per Day/30 Days Discharge Instructions: Apply Kerlix and Coban compression as directed. Compression Wrap: 49M ACE Elastic Bandage With VELCRO Brand Closure, 4 (in) (Generic) 1 x Per Day/30 Days Wound #3 - Foot Wound Laterality: Plantar, Left, Lateral Cleanser: Soap and Water 1 x Per Day/30 Days Discharge Instructions: May shower and wash wound with dial antibacterial soap and water prior to dressing change. Peri-Wound Care: Sween Lotion (Moisturizing lotion) 1 x Per Day/30 Days Discharge Instructions: Apply moisturizing lotion as directed Topical: keystone antibiotic compound 1 x Per Day/30 Days Prim Dressing: Promogran Prisma Matrix, 4.34 (sq in) (silver collagen) (Dispense As Written) 1 x Per Day/30 Days ary Discharge Instructions: Moisten collagen with saline or hydrogel Secondary  Dressing: ABD Pad, 8x10 (Dispense As Written) 1 x Per Day/30 Days Discharge Instructions: Apply over primary dressing as directed. Secondary Dressing: Optifoam Non-Adhesive Dressing, 4x4 in (Dispense As Written) 1 x Per Day/30 Days Discharge Instructions: Apply over primary dressing as directed. Secondary Dressing: NonWoven Sponge, 4x4 (in/in) (Generic) 1 x Per Day/30 Days Secured With: 50M Medipore H Soft Cloth Surgical T ape, 4 x 10 (in/yd) (Generic) 1 x Per Day/30 Days Discharge Instructions: Secure with tape as directed. Compression Wrap: Kerlix Roll 4.5x3.1 (in/yd) 1 x Per  Day/30 Days Discharge Instructions: Apply Kerlix and Coban compression as directed. Compression Wrap: 50M ACE Elastic Bandage With VELCRO Brand Closure, 4 (in) (Generic) 1 x Per Day/30 Days Wound #8 - Foot Wound Laterality: Right, Lateral Cleanser: Soap and Water 1 x Per Day/30 Days Discharge Instructions: May shower and wash wound with dial antibacterial soap and water prior to dressing change. Peri-Wound Care: Sween Lotion (Moisturizing lotion) 1 x Per Day/30 Days Discharge Instructions: Apply moisturizing lotion as directed Topical: keystone antibiotic compound 1 x Per Day/30 Days Prim Dressing: Promogran Prisma Matrix, 4.34 (sq in) (silver collagen) (Dispense As Written) 1 x Per Day/30 Days ary Discharge Instructions: Moisten collagen with saline or hydrogel Secondary Dressing: ABD Pad, 8x10 (Dispense As Written) 1 x Per Day/30 Days Discharge Instructions: Apply over primary dressing as directed. Secondary Dressing: Optifoam Non-Adhesive Dressing, 4x4 in (Dispense As Written) 1 x Per Day/30 Days Discharge Instructions: Apply over primary dressing as directed. Secondary Dressing: NonWoven Sponge, 4x4 (in/in) (Generic) 1 x Per Day/30 Days Secured With: 50M Medipore H Soft Cloth Surgical T ape, 4 x 10 (in/yd) (Generic) 1 x Per Day/30 Days Discharge Instructions: Secure with tape as directed. Compression Wrap: Kerlix Roll 4.5x3.1 (in/yd) 1 x Per Day/30 Days Discharge Instructions: Apply Kerlix and Coban compression as directed. Compression Wrap: 50M ACE Elastic Bandage With VELCRO Brand Closure, 4 (in) (Generic) 1 x Per Day/30 Days Electronic Signature(s) Signed: 12/07/2021 10:10:13 AM By: [name] MD FACS Entered By: [name] on 12/07/2021 09:22:51 -------------------------------------------------------------------------------- Problem List Details Patient Name: Date of Service: [name], [name] 12/07/2021 9:00 A M Medical Record Number: [id number] Patient Account Number:  [id number] Date of Birth/Sex: Treating RN: [birth year] (37 y.o. M) Primary Care Provider: [name] Other Clinician: Referring Provider: Treating Provider/Extender: [name] in Treatment: 110 Active Problems ICD-10 Encounter Code Description Active Date MDM Diagnosis E11.621 Type 2 diabetes mellitus with foot ulcer 10/24/2019 No Yes L97.528 Non-pressure chronic ulcer of other part of left foot with other specified 10/24/2019 No Yes severity L97.518 Non-pressure chronic ulcer of other part of right foot with other specified 10/24/2019 No Yes severity L97.322 Non-pressure chronic ulcer of left ankle with fat layer exposed 09/29/2021 No Yes Inactive Problems ICD-10 Code Description Active Date Inactive Date L97.518 Non-pressure chronic ulcer of other part of right foot with other specified severity 07/14/2020 07/14/2020 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, right ankle and foot 10/24/2019 10/24/2019 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, left ankle and foot 10/24/2019 10/24/2019 B95.62 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 10/24/2019 10/24/2019 classified elsewhere 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 He comes in for review of a wound over the plantar fifth metatarsal head extending into the lateral part of the foot. He was followed for this previously by his podiatrist Dr. Cornelius Moras. As the patient tells his story he went to see podiatry first for a swelling he developed on the lateral part of his fifth metatarsal head in May. He states this was "open" by podiatry and the area closed. He was followed up in June and it was again opened callus removed and it closed promptly. There were plans being made for surgery on the fifth metatarsal head in June however his blood sugar was apparently too high for anesthesia. Apparently the area was debrided and opened again in June and it is never closed since. Looking over the records from podiatry I am really not able to follow this. It was clear when he was first seen it was before 5/14 at that point he already had a wound. By 5/17 the ulcer was resolved. I do not see anything about a procedure. On 5/28 noted to have pre-ulcerative moderate keratosis. X-ray noted 1/5 contracted toe and tailor's bunion and metatarsal deformity. On a visit date on 09/28/2018 the dorsal part of the left foot it healed and resolved. There was concern about swelling in his lower extremity he was sent to the ER.. As far as I can tell he was seen in the ER on 7/12 with an ulcer on his left foot. A DVT rule out of the left leg was negative. I do not think I have complete records from podiatry but I am not able to verify the procedures this patient states he had. He states  after the last procedure the wound has never closed although I am not able to follow this in the records I have from podiatry. He has not had a recent x-ray The patient has been using Neosporin on the wound. He is wearing a Darco shoe. He is still very active up on his foot working and exercising. Past medical history; type 2 diabetes ketosis-prone, leg swelling with a negative DVT study in July. Non-smoker ABI in our clinic was 0.85 on the left 10/16; substantial wound on the plantar left fifth met head extending laterally almost to the dorsal fifth MTP. We have been using silver alginate we gave him a Darco forefoot off loader. An x-ray did not show evidence of osteomyelitis did note soft tissue emphysema which I think was due to gas tracking through an open wound. There is no doubt in my mind he requires an MRI 10/23; MRI not booked until 3 November at the earliest this is largely due to his glucose sensor in the right arm. We have been using silver alginate. There has been an improvement 10/29; I am still not exactly sure when his MRI is booked for. He says it is the third but it is the 10th in epic. This definitely needs to be done. He is running a low-grade fever  today but no other symptoms. No real improvement in the 1 02/26/2019 patient presents today for a follow-up visit here in our clinic he is last been seen in the clinic on October 29. Subsequently we were working on getting MRI to evaluate and see what exactly was going on and where we would need to go from the standpoint of whether or not he had osteomyelitis and again what treatments were going be required. Subsequently the patient ended up being admitted to the hospital on 02/07/2019 and was discharged on 02/14/2019. This is a somewhat interesting admission with a discharge diagnosis of pneumonia due to COVID-19 although he was positive for COVID-19 when tested at the urgent care but negative x2 when he was actually in the hospital.  With that being said he did have acute respiratory failure with hypoxia and it was noted he also have a left foot ulceration with osteomyelitis. With that being said he did require oxygen for his pneumonia and I level 4 L. He was placed on antivirals and steroids for the COVID-19. He was also transferred to the [facility] at one point. Nonetheless he did subsequently discharged home and since being home has done much better in that regard. The CT angiogram did not show any pulmonary embolism. With regard to the osteomyelitis the patient was placed on vancomycin and Zosyn while in the hospital but has been changed to Augmentin at discharge. It was also recommended that he follow- up with wound care and podiatry. Podiatry however wanted him to see [name] according to the patient prior to them doing anything further. His hemoglobin A1c was 9.9 as noted in the hospital. Have an MRI of the left foot performed while in the hospital on 02/04/2019. 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 There is an overlying plantar open wound noted an abscess tracking back along the lateral aspect of the fifth metatarsal shaft. There is otherwise diffuse cellulitis and mild fasciitis without findings of polymyositis. The patient did have recently pneumonia secondary to COVID-19 I looked in the chart through epic and it does appear that the patient may need to have an additional x-ray just to ensure everything is cleared and that he has no airspace disease prior to putting him into the [name]. 03/05/2019; patient was readmitted to the clinic last week. He was hospitalized twice for a viral upper respiratory tract infection from 11/1 through 11/4 and then 11/5 through 11/12 ultimately this turned out to be Covid pneumonitis. Although he was discharged on oxygen he is not using it. He says he feels fine. He has no exercise  limitation no cough no sputum. His O2 sat in our clinic today was 100% on room air. 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 He received Vanco and Zosyn in the hospital and then was discharged on 2 weeks of Augmentin. I do not see any relevant cultures. He was supposed to follow-up with infectious disease but I do not see that he has an appointment. 12/8; patient saw Dr. Novella Olive of infectious disease last week. He felt that he had had adequate antibiotic therapy. He did not go to follow-up with Dr. Amalia Hailey of podiatry and I have again talked to him about the pros and cons of this. He does not want to consider a ray amputation of this  time. He is aware of the risks of recurrence, migration etc. He started HBO today and tolerated this well. He can complete the Augmentin that I gave him last week. I have looked over the lab work that Dr. Chana Bode ordered his C-reactive protein was 3.3 and his sedimentation rate was 17. The C-reactive protein is never really been measurably that high in this patient 12/15; not much change in the wound today however he has undermining along the lateral part of the foot again more extensively than last week. He has some rims of epithelialization. We have been using silver alginate. He is undergoing hyperbarics but did not dive today 12/18; in for his obligatory first total contact cast change. Unfortunately there was pus coming from the undermining area around his fifth metatarsal head. This was cultured but will preclude reapplication of a cast. He is seen in conjunction with HBO 12/24; patient had staph lugdunensis in the wound in the undermining area laterally last time. We put him on doxycycline which should have covered this. The wound looks better today. I am going to give him another week of doxycycline before reattempting the total contact cast 12/31;  the patient is completing antibiotics. Hemorrhagic debris in the distal part of the wound with some undermining distally. He also had hyper granulation. Extensive debridement with a #5 curette. The infected area that was on the lateral part of the fifth met head is closed over. I do not think he needs any more antibiotics. Patient was seen prior to HBO. Preparations for a total contact cast were made in the cast will be placed post hyperbarics 04/11/19; once again the patient arrives today without complaint. He had been in a cast all week noted that he had heavy drainage this week. This resulted in large raised areas of macerated tissue around the wound 1/14; wound bed looks better slightly smaller. Hydrofera Blue has been changing himself. He had a heavy drainage last week which caused a lot of maceration around the wound so I took him out of a total contact cast he says the drainage is actually better this week He is seen today in conjunction with HBO 1/21; returns to clinic. He was up in [place] for a day or 2 attending a funeral. He comes back in with the wound larger and with a large area of exposed bone. He had osteomyelitis and septic arthritis of the fifth left metatarsal head while he was in hospital. He received IV antibiotics in the hospital for a prolonged period of time then 3 weeks of Augmentin. Subsequently I gave him 2 weeks of doxycycline for more superficial wound infection. When I saw this last week the wound was smaller the surface of the wound looks satisfactory. 1/28; patient missed hyperbarics today. Bone biopsy I did last time showed Enterococcus faecalis and Staphylococcus lugdunensis . He has a wide area of exposed bone. We are going to use silver alginate as of today. I had another ethical discussion with the patient. 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 In this situation I think the likelihood of healing this is low. Therefore I have  recommended a ray amputation and with the patient's agreement I have referred him to Dr. Doran Durand. The other issue is that his compliance with hyperbarics has been minimal because of his work schedule and given his underlying decision I am going to stop this today READMISSION 10/24/2019 MRI 09/29/2019 left foot IMPRESSION: 1. Apparent skin ulceration inferior and lateral to the  5th metatarsal base with underlying heterogeneous T2 signal and enhancement in the subcutaneous fat. Small peripherally enhancing fluid collections along the plantar and lateral aspects of the 5th metatarsal base suspicious for abscesses. 2. Interval amputation through the mid 5th metatarsal with nonspecific low-level marrow edema and enhancement. Given the proximity to the adjacent soft tissue inflammatory changes, osteomyelitis cannot be excluded. 3. The additional bones appear unremarkable. MRI 09/29/2019 right foot IMPRESSION: 1. Soft tissue ulceration lateral to the 5th MTP joint. There is low-level T2 hyperintensity within the 4th and 5th metatarsal heads and adjacent proximal phalanges without abnormal T1 signal or cortical destruction. These findings are nonspecific and could be seen with early marrow edema, hyperemia or early osteomyelitis. No evidence of septic joint. 2. Mild tenosynovitis and synovial enhancement associated with the extensor digitorum tendons at the level of the midfoot. 3. Diffuse low-level muscular T2 hyperintensity and enhancement, most consistent with diabetic myopathy. LEFT FOOT BONE Methicillin resistant staphylococcus aureus Staphylococcus lugdunensis MIC MIC CIPROFLOXACIN >=8 RESISTANT Resistant <=0.5 SENSI... Sensitive CLINDAMYCIN <=0.25 SENS... Sensitive >=8 RESISTANT Resistant ERYTHROMYCIN >=8 RESISTANT Resistant >=8 RESISTANT Resistant GENTAMICIN <=0.5 SENSI... Sensitive <=0.5 SENSI... Sensitive Inducible Clindamycin NEGATIVE Sensitive NEGATIVE Sensitive OXACILLIN >=4  RESISTANT Resistant 2 SENSITIVE Sensitive RIFAMPIN <=0.5 SENSI... Sensitive <=0.5 SENSI... Sensitive TETRACYCLINE <=1 SENSITIVE Sensitive <=1 SENSITIVE Sensitive TRIMETH/SULFA <=10 SENSIT Sensitive <=10 SENSIT Sensitive ... Marland Kitchen.. VANCOMYCIN 1 SENSITIVE Sensitive <=0.5 SENSI... Sensitive Right foot bone . Component 3 wk ago Specimen Description BONE Special Requests RIGHT 4 METATARSAL SAMPLE B Gram Stain NO WBC SEEN NO ORGANISMS SEEN Culture RARE METHICILLIN RESISTANT STAPHYLOCOCCUS AUREUS NO ANAEROBES ISOLATED Performed at [facility], [address], [place], [place] [address] Report Status 10/08/2019 FINAL Organism ID, Bacteria METHICILLIN RESISTANT STAPHYLOCOCCUS AUREUS Resulting Agency [facility] Susceptibility Methicillin resistant staphylococcus aureus MIC CIPROFLOXACIN >=8 RESISTANT Resistant CLINDAMYCIN <=0.25 SENS... Sensitive ERYTHROMYCIN >=8 RESISTANT Resistant GENTAMICIN <=0.5 SENSI... Sensitive Inducible Clindamycin NEGATIVE Sensitive OXACILLIN >=4 RESISTANT Resistant RIFAMPIN <=0.5 SENSI... Sensitive TETRACYCLINE <=1 SENSITIVE Sensitive TRIMETH/SULFA <=10 SENSIT Sensitive ... VANCOMYCIN 1 SENSITIVE Sensitive This is a patient we had in clinic earlier this year with a wound over his left fifth metatarsal head. 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 Doran Durand on 05/16/2019. He seems to have developed problems with wounds on his bilateral feet in June 2021 although he may have had problems earlier than this. He was in an urgent care with a right foot ulcer on 09/26/2019 and given a course of doxycycline. This was apparently after having trouble getting into see orthopedics. He was seen  by podiatry on 09/28/2019 noted to have bilateral lower extremity ulcers including the left lateral fifth metatarsal base and the right subfifth met head. It was noted that had purulent drainage at that time. He required hospitalization from 6/20 through 7/2. This was because of worsening right foot wounds. He underwent bilateral operative incision and drainage and bone biopsies bilaterally. Culture results are listed above. He has been referred back to clinic by Dr. Jacqualyn Posey of podiatry. He is also followed by Dr. Megan Salon who saw him yesterday. He was discharged from hospital on Zyvox Flagyl and Levaquin and yesterday changed to doxycycline Flagyl and Levaquin. His inflammatory markers on 6/26 showed a sedimentation rate of 129  and a C-reactive protein of 5. This is improved to 14 and 1.3 respectively. This would indicate improvement. ABIs in our clinic today were 1.23 on the right and 1.20 on the left 11/01/2019 on evaluation today patient appears to be doing fairly well in regard to the wounds on his feet at this point. Fortunately there is no signs of active infection at this time. No fevers, chills, nausea, vomiting, or diarrhea. He currently is seeing infectious disease and still under their care at this point. Subsequently he also has both wounds which she has not been using collagen on as he did not receive that in his packaging he did not call us and let us know that. Apparently that just was missed on the order. Nonetheless we will get that straightened out today. 8/9-Patient returns for bilateral foot wounds, using Prisma with hydrogel moistened dressings, and the wounds appear stable. Patient using surgical shoes, avoiding much pressure or weightbearing as much as possible 8/16; patient has bilateral foot wounds. 1 on the right lateral foot proximally the other is on the left mid lateral foot. Both required debridement of callus and thick skin around the wounds. We have been using silver  collagen 8/27; patient has bilateral lateral foot wounds. The area on the left substantially surrounded by callus and dry skin. This was removed from the wound edge. The underlying wound is small. The area on the right measured somewhat smaller today. 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 Unfortunately I did not update his antibiotics during today's visit. 9/10 I reviewed Dr. Hale Bogus last notes he felt he had completed antibiotics his inflammatory markers were reasonably well controlled. He has a small wound on the lateral left foot and a tiny area on the right which is just above closed. He is using Hydrofera Blue with border foam he has bilateral surgical shoes 9/24; 2 week f/u. doing well. right foot is closed. left foot still undermined. 10/14; right foot remains closed at the fifth met head. The area over the base of the left fifth metatarsal has a small open area but considerable undermining towards the plantar foot. Thick callus skin around this suggests an adequate pressure relief. We have talked about this. He says he is going to go back into his cam boot. I suggested a total contact cast he did not seem enamored with this suggestion 10/26; left foot base of the fifth metatarsal. Same condition as last time. He has skin over the area with an open wound however the skin is not adherent. He went to see Dr. Earleen Newport who did an x-ray and culture of his foot I have not reviewed the x-ray but the patient was not told anything. He is on doxycycline 11/11; since the patient was last here he was in the emergency room on 10/30 he was concerned about swelling in the left foot. They did not do any cultures or x-rays. They changed his antibiotics to cephalexin. Previous culture showed group B strep. The cephalexin is appropriate as doxycycline has less than predictable coverage. Arrives in clinic today with swelling over this area  under the wound. He also has a new wound on the right fifth metatarsal head 11/18; the patient has a difficult wound on the lateral aspect of the left fifth metatarsal head. The wound was almost ballotable last week I opened it slightly expecting to see purulence however there was just bleeding. I cultured this this was negative. X-ray  unchanged. We are trying to get an MRI but I am not sure were going to be able to get this through his insurance. He also has an area on the right lateral fifth metatarsal head this looks healthier 12/3; the patient finally got our MRI. Surprisingly this did not show osteomyelitis. I did show the soft tissue ulceration at the lateral plantar aspect of the fifth metatarsal base with a tiny residual 6 mm abscess overlying the superficial fascia I have tried to culture this area I have not been able to get this to grow anything. Nevertheless the protruding tissue looks aggravated. I suspect we should try to treat the underlying "abscess with broad-spectrum antibiotics. I am going to start him on Levaquin and Flagyl. He has much less edema in his legs and I am going to continue to wrap his legs and see him weekly 12/10. I started Levaquin and Flagyl on him last week. He just picked up the Flagyl apparently there was some delay. The worry is the wound on the left fifth metatarsal base which is substantial and worsening. His foot looks like he inverts at the ankle making this a weightbearing surface. Certainly no improvement in fact I think the measurements of this are somewhat worse. We have been using 12/17; he apparently just got the Levaquin yesterday this is 2 weeks after the fact. He has completed the Flagyl. The area over the left fifth metatarsal base still has protruding granulation tissue although it does not look quite as bad as it did some weeks ago. He has severe bilateral lymphedema although we have not been treating him for wounds on his legs this is definitely  going to require compression. There was so much edema in the left I did not wish to put him in a total contact cast today. I am going to increase his compression from 3-4 layer. The area on the right lateral fifth met head actually look quite good and superficial. 12/23; patient arrived with callus on the right fifth met head and the substantial hyper granulated callused wound on the base of his fifth metatarsal. He says he is completing his Levaquin in 2 days but I do not think that adds up with what I gave him but I will have to double check this. We are using Hydrofera Blue on both areas. My plan is to put the left leg in a cast the week after New Year's 04/06/2020; patient's wounds about the same. Right lateral fifth metatarsal head and left lateral foot over the base of the fifth metatarsal. There is undermining on the left lateral foot which I removed before application of total contact cast continuing with Hydrofera Blue new. Patient tells me he was seen by endocrinology today lab work was done [Dr. Kerr]. Also wondering whether he was referred to cardiology. I went over some lab work from previously does not have chronic renal failure certainly not nephrotic range proteinuria he does have very poorly controlled diabetes but this is not his most updated lab work. Hemoglobin A1c has been over 11 1/10; the patient had a considerable amount of leakage towards mid part of his left foot with macerated skin however the wound surface looks better the area on the right lateral fifth met head is better as well. I am going to change the dressing on the left foot under the total contact cast to silver alginate, continue with Hydrofera Blue on the right. 1/20; patient was in the total contact cast for 10 days. Considerable  amount of drainage although the skin around the wound does not look too bad on the left foot. The area on the right fifth metatarsal head is closed. Our nursing staff reports large amount  of drainage out of the left lateral foot wound 1/25; continues with copious amounts of drainage described by our intake staff. PCR culture I did last week showed E. coli and Enterococcus faecalis and low quantities. Multiple resistance genes documented including extended spectrum beta lactamase, MRSA, MRSE, quinolone, tetracycline. The wound is not quite as good this week as it was 5 days ago but about the same size 2/3; continues with copious amounts of malodorous drainage per our intake nurse. The PCR culture I did 2 weeks ago showed E. coli and low quantities of Enterococcus. There were multiple resistance genes detected. I put Neosporin on him last week although this does not seem to have helped. The wound is slightly deeper today. Offloading continues to be an issue here although with the amount of drainage she has a total contact cast is just not going to work 2/10; moderate amount of drainage. Patient reports he cannot get his stocking on over the dressing. I told him we have to do that the nurse gave him suggestions on how to make this work. The wound is on the bottom and lateral part of his left foot. Is cultured predominantly grew low amounts of Enterococcus, E. coli and anaerobes. There were multiple resistance genes detected including extended spectrum beta lactamase, quinolone, tetracycline. I could not think of an easy oral combination to address this so for now I am going to do topical antibiotics provided by [facility] I think the main agents here are vancomycin and an aminoglycoside. We have to be able to give him access to the wounds to get the topical antibiotic on 2/17; moderate amount of drainage this is unchanged. He has his Keystone topical antibiotic against the deep tissue culture organisms. He has been using this and changing the dressing daily. Silver alginate on the wound surface. 2/24; using Keystone antibiotic with silver alginate on the top. He had too much drainage for a  total contact cast at one point although I think that is improving and I think in the next week or 2 it might be possible to replace a total contact cast I did not do this today. In general the wound surface looks healthy however he continues to have thick rims of skin and subcutaneous tissue around the wide area of the circumference which I debrided 06/04/2020 upon evaluation today patient appears to be doing well in regard to his wound. I do feel like he is showing signs of improvement. There is little bit of callus and dead tissue around the edges of the wound as well as what appears to be a little bit of a sinus tract that is off to the side laterally I would perform debridement to clear that away today. 3/17; left lateral foot. The wound looks about the same as I remember. Not much depth surface looks healthy. No evidence of infection 3/25; left lateral foot. Wound surface looks about the same. Separating epithelium from the circumference. There really is no evidence of infection here however not making progress by my view 3/29; left lateral foot. Surface of the wound again looks reasonably healthy still thick skin and subcutaneous tissue around the wound margins. There is no evidence of infection. One of the concerns being brought up by the nurses has again the amount of drainage vis--vis continued  use of a total contact cast 4/5; left lateral foot at roughly the base of the fifth metatarsal. Nice healthy looking granulated tissue with rims of epithelialization. The overall wound measurements are not any better but the tissue looks healthy. The only concern is the amount of drainage although he has no surrounding maceration with what we have been doing recently to absorb fluid and protect his skin. He also has lymphedema. 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 He states that the kerlix/coban under the TCC rolled down and could not get it back up. He has been using an offloading scooter and has somehow hurt his right foot using this device. This happened last week. He states that the side of his right foot developed a blister and opened. The top of his foot also has a few small open wounds he thinks is due to his socks rubbing in his shoes. He has not been using any dressings to the wound. He denies purulent drainage, fever/chills or erythema to the wounds. 4/22; patient presents for 1 week follow-up. He developed new wounds to the right foot that were evaluated at last clinic visit. He continues to have a total contact cast to the left leg and he reports no issues. He has been using silver collagen to the right foot wounds with no issues. He denies purulent drainage, fever/chills or erythema to the right foot wounds. He has no complaints today 4/25; patient presents for 1 week follow-up. He has a total contact cast of the left leg and reports no issues. He has been using silver alginate to the right foot wound. He denies purulent drainage, fever/chills or erythema to the right foot wounds. 5/2 patient presents for 1 week follow-up. T contact cast on the left. The wound which is on the base of the plantar foot at the base of the fifth metatarsal otal actually looks quite good and dimensions continue to gradually contract. HOWEVER the area on the right lateral fifth metatarsal head is much larger than what I remember from 2 weeks ago. Once more is he has significant levels of hypergranulation. Noteworthy that he had this same hyper granulated response on his wound on the left foot at one point in time. So much so that he I thought there was an underlying fluid collection. Based on this I think this just needs debridement. 5/9; the wound on the  left actually continues to be gradually smaller with a healthy surface. Slight amount of drainage and maceration of the skin around but not too bad. However he has a large wound over the right fifth metatarsal head very much in the same configuration as his left foot wound was initially. I used silver nitrate to address the hyper granulated tissue no mechanical debridement 5/16; area on the left foot did not look as healthy this week deeper thick surrounding macerated skin and subcutaneous tissue. oo The area on the right foot fifth met head was about the same oo The area on the right ankle that we identified last week is completely broken down into an open wound presumably a stocking rubbing issue 5/23; patient has been using a total contact cast to the left side. He has been using silver alginate underneath. He  has also been using silver alginate to the right foot wounds. He has no complaints today. He denies any signs of infection. 5/31; the left-sided wound looks some better measure smaller surface granulation looks better. We have been using silver alginate under the total contact cast oo The large area on his right fifth met head and right dorsal foot look about the same still using silver alginate 6/6; neither side is good as I was hoping although the surface area dimensions are better. A lot of maceration on his left and right foot around the wound edge. Area on the dorsal right foot looks better. He says he was traveling. I am not sure what does the amount of maceration around the plantar wounds may be drainage issues 6/13; in general the wound surfaces look quite good on both sides. Macerated skin and raised edges around the wound required debridement although in general especially on the left the surface area seems improved. oo The area on the right dorsal ankle is about the same I thought this would not be such a problem to close 6/20; not much change in either wound although the one  on the right looks a little better. Both wounds have thick macerated edges to the skin requiring debridements. We have been using silver alginate. The area on the dorsal right ankle is still open I thought this would be closed. 6/28; patient comes in today with a marked deterioration in the right foot wound fifth met head. Wide area of exposed bone this is a drastic change from last time. The area on the left there we have been casting is stagnant. We have been using silver alginate in both wound areas. 7/5; bone culture I did for PCR last time was positive for Pseudomonas, group B strep, Enterococcus and Staph aureus. There was no suggestion of methicillin resistance or ampicillin resistant genes. This was resistant to tetracycline however He comes into the clinic today with the area over his right plantar fifth metatarsal head which had been doing so well 2 weeks ago completely necrotic feeling bone. I do not know that this is going to be salvageable. The left foot wound is certainly no smaller but it has a better surface and is superficial. 7/8; patient called in this morning to say that his total contact cast was rubbing against his foot. He states he is doing fine overall. He denies signs of infection. 7/12; continued deterioration in the wound over the right fifth metatarsal head crumbling bone. This is not going to be salvageable. The patient agrees and wants to be referred to Dr. Doran Durand which we will attempt to arrange as soon as possible. I am going to continue him on antibiotics as long as that takes so I will renew those today. The area on the left foot which is the base of the fifth metatarsal continues to look somewhat better. Healthy looking tissue no depth no debridement is necessary here. 7/20; the patient was kindly seen by Dr. Doran Durand of orthopedics on 10/19/2020. He agreed that he needed a ray amputation on the right and he said he would have a look at the fourth as well while he was  intraoperative. Towards this end we have taken him out of the total contact cast on the left we will put him in a wrap with Hydrofera Blue. As I understand things surgery is planned for 7/21 7/27; patient had his surgery last Thursday. 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 Apparently everything went well we did  not still disturb that today The area on the left foot actually looks quite good. He has been much less mobile which probably explains this he did not seem to do well in the total contact cast secondary to drainage and maceration I think. 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 Fortunately there is no evidence of active infection at this time. No fevers, chills, nausea, vomiting, or diarrhea. Overall I think that he is actually doing extremely well. Nonetheless I do believe that he is staying off of this more following the surgery in his right foot that is the reason the left is doing so great. 8/16; left plantar foot wound. This looks smaller than the last time I saw this he is using Hydrofera Blue. The surgical wound on the right foot is being followed by Dr. Doran Durand we did not look at this today. He has surgical shoes on both feet 8/23; left plantar foot wound not as good this week. Surrounding macerated skin and subcutaneous tissue everything looks moist and wet. I do not think he is offloading this adequately. He is using a surgical shoe Apparently the right foot surgical wound is not open although I did not check his foot 8/31; left plantar foot lateral aspect. Much improved this week. He has no maceration. Some improvement in the surface area of the wound but most impressively the depth is come in we are using silver alginate. The patient is a Product/process development scientist. He is asked that we write him a letter so he can go back to work. I have also tried to see if we can write  something that will allow him to limit the amount of time that he is on his foot at work. 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 Hopefully the latter can be avoided 9/6; I believe the patient missed an appointment last week. He arrives in today with a wound looking roughly the same certainly no better. Undermining laterally and also inferiorly. We used molecuLight today in training with the patient's permission.. We are using silver alginate 9/21 wound is measuring bigger this week although this may have to do with the aggressive circumferential debridement last week in response to the blush fluorescence on the MolecuLight. Culture I did last week showed significant MSSA and E. coli. I put him on Augmentin but he has not started it yet. We are also going to send this for compounded antibiotics at [facility]. There is no evidence of systemic infection 9/29; silver alginate. His Keystone arrived. He is completing Augmentin in 2 days. Offloading in a cam boot. Moderate drainage per our intake staff 10/5; using silver alginate. He has been using his [place]. He has completed his Augmentin. Per our intake nurse still a lot of drainage, far too much to consider a total contact cast. Wound measures about the same. He had the same undermining area that I defined last week from a roughly 11-3. I remove this today 10/12; using silver alginate he is using the [place]. He comes in for a nurse visit hence we are applying [employer] twice a week. Measuring slightly better today and less notable drainage. Extensive debridement of the wound edge last time 10/18; using topical Keystone and silver alginate and a soft cast. Wound measurements about the same. Drainage was through  his soft cast. We are changing this twice a week Tuesdays and Friday 10/25; comes in with moderate drainage. Still using Keystone silver alginate and a soft cast.  Wound dimensions completely the same.He has a lot of edema in the left leg he has lymphedema. Asking for [name] to consider wrapping him as he cannot get his stocking on over the soft cast 11/2; comes in with moderate to large drainage slightly smaller in terms of width we have been using [place]. His wound looks satisfactory but not much improvement 11/4; patient presents today for obligatory cast change. Has no issues or complaints today. He denies signs of infection. 11/9; patient traveled this weekend to [place], was on the cast quite a bit. Staining of the cast with black material from his walking boot. Drainage was not quite as bad as we feared. Using silver alginate and Keystone 11/16; we do not have size for cast therefore we have been putting a soft cast on him since the change on Friday. Still a significant amount of drainage necessitating changing twice a week. We have been using the Keystone at cast changes either hard or soft as well as silver alginate Comes in the clinic with things actually looking fairly good improvement in width. He says his offloading is about the same 02/24/2021 upon evaluation today patient actually comes back in and is doing excellent in regard to his foot ulcer this is significantly smaller even compared to the last visit. The soft cast seems to have done extremely well for him which is great news. I do not see any signs of infection minimal debridement will be needed today. 11/30; left lateral foot much improved half a centimeter improvement in surface area. No evidence of infection. He seems to be doing better with the soft cast in the TCC therefore we will continue with this. He comes back in later in the week for a change with the nurses. This is due to drainage 12/6; no improvement in dimensions. Under illumination some debris on the surface we have been using silver alginate, soft cast. If there is anything optimistic here he seems to have have less  drainage 12/13. Dimensions are improved both length and width and slightly in depth. Appears to be quite healthy today. Raised edges of this thick skin and callus around the edges however. He is in a soft cast were bringing him back once for a change on Friday. Drainage is better 12/20. Dimensions are improved. He still has raised edges of thick skin and callus around the edges. We are using a soft cast 12/28; comes in today with thick callus around the wound. Using silver under alginate under a soft cast. I do not think there is much improvement in any measurement 2023 04/06/2021; patient was put in a total contact cast. Unfortunately not much change in surface area 1/10; not much different still thick callus and skin around the edge in spite of the total contact cast. This was just debrided last week we have been using the [facility] compounded antibiotic and silver alginate under a total contact cast 1/18 the patient's wound on the left side is doing nicely. smaller HOWEVER he comes in today with a wound on the right foot laterally. blister most likely serosangquenous drainage 1/24; the patient continues to do well in terms of the plantar left foot which is continued to contract using silver alginate under the total contact cast HOWEVER the right lateral foot is bigger with denuded skin around the edges. I used  pickups and a #15 scalpel to remove this this looks like the remanence of a large blister. Cannot rule out infection. Culture in this area I did last week showed Staphylococcus lugdunensis few colonies. I am going to try to address this with his [employer] antibiotic that is done so well on the left having linezolid and this should cover the staph 2/1; the patient's wound on his left foot which was the original plantar foot wound thick skin and eschar around the edges even in the total contact cast but the wound surface does not look too bad The real problem is on how his right lateral foot at  roughly the base of the fifth metatarsal. The wound is completely necrotic more worrisome than that there is swelling around the edges of this. We have been using silver alginate on both wounds and Keystone on the right foot. Unfortunately I think he is going to require systemic antibiotics while we await cultures. He did not get the x-ray done that we ordered last week [lost the prescription 2/7; disappointingly in the area on the left foot which we are treating with a total contact cast is still not closed although it is much smaller. He continues to have a lot of callus around the wound edge. -Right lateral foot culture I did last week was negative x-ray also negative for osteomyelitis. 2/15: TCC silver alginate on the left and silver alginate on the right lateral. No real improvement in either area 05/26/2021: T oday, the wounds are roughly the same size as at his previous visit, post-debridement. He continues to endorse fairly substantial drainage, particularly on the right. He has been in a total contact cast on the left. There is still some callus surrounding this lesion. On the right, the periwound skin is quite macerated, along with surrounding callus. The center of the right-sided wound also has some dark, densely adherent material, which is very difficult to remove. 06/02/2021: Today, both wounds are slightly smaller. He has been using zinc oxide ointment around the right ulcer and the degree of maceration has improved markedly. There continues to be an area of nonviable tissue in the center of the right sided ulcer. The left-sided wound, which has been in the total contact cast. Appears clean and the degree of callus around it is less than previously. 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 He had to take the stairs and both wounds have increased in size. The left foot, which has been in a total contact cast, has developed a  tunnel tracking to the lateral aspect of his foot. The nonviable tissue in the center of the right-sided ulcer remains recalcitrant to debridement. There is significant undermining surrounding the entirety of the left sided wound. 06/16/2021: The elevator at school has been fixed and the patient has been able to avoid putting as much weight on his wounds over the past week. We converted the left foot wound into a single lesion today, but despite this, the wound is actually smaller. The base is healthy with limited periwound callus. On the right, the central necrotic area is still present. He continues to be quite macerated around the right sided wound, despite applying barrier cream. This does, however, have the benefit of softening the callus to make it more easily removable. 06/23/2021: Today, the left wound is smaller. The lateral aspect that had opened up previously is now closed. The wound base has a healthy bed of granulation  tissue and minimal slough. Unfortunately, on the right, the wound is larger and continues to be fairly macerated. He has also reopened the wound at his right ankle. He thinks this is due to the gait he has adopted secondary to his total contact cast and boot. 06/30/2021: T oday, both wounds are a little bit larger. The lateral aspect on the left has remained closed. He continues to have significant periwound maceration. The culture that I took from the right sided wound grew a population of bacteria that is not covered by his current [facility] antibiotic. The center of the right- sided wound continues to appear necrotic with nonviable fat. It probes deeper today, but does not reach bone. 07/07/2021: The periwound maceration is a little bit less today. The right lateral foot wound has some areas that appear more viable and the necrotic center also looks a little bit better. The wound on the dorsal surface of his right foot near the ankle is contracting and the surface appears  healthy. The left plantar wound surface looks healthy, but there is some new undermining on the medial portion. He did get his new Keystone antibiotic and began applying that to the right foot wound on Saturday. 07/14/2021: The intake nurse reported substantial drainage from his wounds, but the periwound skin actually looks better than is typical for him. The wound on the dorsal surface of his right foot near the ankle is smaller and just has a small open area underneath some dried eschar. The left plantar wound surface looks healthy and there has been no significant accumulation of callus. The right lateral foot wound looks quite a bit better, with the central portion, which typically appears necrotic, looking more viable albeit pale. 07/22/2021: His left foot is extremely macerated today. The wound is about the same size. The wound on the dorsal surface of his right foot near the ankle had closed, but he traumatized it removing the dressing and there is a tiny skin tear in that location. The right lateral foot wound is bigger, but the surface appears healthy. 07/30/2021: The wound on the dorsal surface of his right foot near the ankle is closed. The right lateral foot wound again is a little bit bigger due to some undermining. The periwound skin is in better condition, however. He has been applying zinc oxide. The wound surface is a little bit dry today. On the left, he does not have the substantial maceration that we frequently see. The wound itself is smaller and has a clean surface. 08/06/2021: Both wounds seem to have deteriorated over the past week. The right lateral foot wound has a dry surface but the periwound is boggy.. Overall wound dimensions are about the same. On the left, the wound is about the same size, but there is more undermining present underneath periwound callus. 08/13/2021: The right sided wound looks about the same, but on the left there has been substantial deterioration. The  undermining continues to extend under periwound callus. Once this was removed, substantial extension of the wound was present. There is no odor or purulent drainage but clearly the wounds have broken down. 08/20/2021: The wounds look about the same today. He has been out of his total contact cast and has just been changing the dressings using topical Keystone with PolyMem Ag, Kerlix and Ace bandages. The wound on the top of his right ankle has reopened but this is quite small. There was a little bit of purulent material that I expressed when examining this wound.  08/24/2021: After the aggressive debridement I performed at his last visit, the wounds actually look a little bit better today. They are smaller with the exception of the wound on the top of his right ankle which is a little bit bigger as some more skin pulled off when he was changing his dressing. We are using topical Keystone with PolyMem Ag Kerlix and Ace bandages. 09/02/2021: There has been really no change to any of his wounds. 09/16/2021: The patient was hospitalized last week with nausea, vomiting, and dehydration. He says that while he was in the hospital, his wounds were not really addressed properly. T oday, both plantar foot wounds are larger and the periwound skin is macerated. The wound on the dorsum of his right foot has a scab on the top. The right foot now has a crater where previously he had had nonviable fat. It looks as though this simply died and fell out. The periwound callus is wet. 09/24/2021: His wounds have deteriorated somewhat since his last visit. The wound on the dorsum of his right foot near his ankle is larger and has more nonviable tissue present. The crater in his right foot is even deeper; I cannot quite palpate or probe to bone but I am sure it is close. The wound on his left plantar foot has an odd boggy area in the center that almost feels as though it has fluid within it. He has run out of his topical Keystone  antibiotic. We are using silver alginate on his wounds. 09/29/2021: He has developed a new wound on the dorsum of his left foot near his ankle. He says he thinks his wrapping is rubbing in that site. I would concur with this as the wound on his right ankle is larger. The left foot looks about the same. The right foot has the crater that was present last week. No significant slough accumulation, but his foot remains quite swollen and warm despite oral antibiotic therapy. 10/08/2021: All of his wounds look about the same as last week. He did not start his oral antibiotics that are prescribed until just a couple of days ago; his [employer] compounded antibiotics formula has been changed and he is awaiting delivery of the new recipe. His MRI that was scheduled for earlier this week was canceled as no prior authorization had been obtained; unfortunately the tech responsible sent an email to my old [employer] email, which I no longer use nor have access to. 10/18/2021: The wounds on his bilateral dorsal feet near the ankles are both improved. They are smaller and have just some eschar and slough buildup. The left plantar wound has a fair amount of undermining, but the surface is clean. There is some periwound callus accumulation. On the right plantar foot, there is nonviable fat leading to a deep tunnel that tracks towards his dorsal medial foot. There is periwound callus and slough accumulation, as well. His right foot and leg remain swollen as compared to the left. 10/25/2021: The wounds on his bilateral dorsal feet and at the ankles have broken down somewhat. They are little bit larger than last week. The left plantar wound continues to undermine laterally but the surface is clean. The right plantar foot wound shows some decreased depth in the tunnel tracking towards his dorsal medial foot. He has not yet had the Doppler study that I ordered; it sounds like there is some confusion about the scheduling of the  procedure. In addition, the MRI was denied and I have  taken steps to appeal the denial. 11/24/2021: Since our last visit, Mr. [name] was admitted to the hospital where an MRI suggested osteomyelitis. He was taken the operating room by podiatry. Bone biopsies were negative for osteomyelitis. They debrided his wounds and applied myriad matrix. He saw them last week and they removed his staples. He is here today to continue his wound healing process. T oday, both of the dorsal foot/ankle wounds are substantially smaller. There is just a little eschar overlying the left sided wound and some eschar and slough on the right. The right plantar foot ulcer has the healthiest surface of granulation tissue that I have seen to date. A portion of the myriad matrix failed to take and was hanging loose. It appears that myriad morcells were placed into the tunnel closest to the dorsal portion of his foot. These have sloughed off. The left plantar foot ulcer is about the same size, but has a much healthier surface than in the past. Both plantar ulcers have callus and slough accumulation. 12/07/2021: Left dorsal foot/ankle wound is closed. The right dorsal foot/ankle wound is nearly closed and just has a small open area with some eschar and slough. The right plantar foot wound has contracted quite a bit since our last visit. It has a healthy surface with just a little bit of slough accumulation and periwound callus buildup. The left plantar foot wound is about the same size but the surface appears healthy. There is a little slough and periwound callus on this side, as well. Patient History Information obtained from Patient. Family History No family history of Cancer, Diabetes, Hereditary Spherocytosis, Hypertension, Kidney Disease, Lung Disease, Seizures, Stroke, Thyroid Problems, Tuberculosis. Social History Never smoker, Marital Status - Single, Alcohol Use - Rarely, Drug Use - No History, Caffeine Use -  Never. Medical History Eyes Denies history of Cataracts, Glaucoma, Optic Neuritis Ear/Nose/Mouth/Throat Denies history of Chronic sinus problems/congestion, Middle ear problems Hematologic/Lymphatic Denies history of Anemia, Hemophilia, Human Immunodeficiency Virus, Lymphedema, Sickle Cell Disease Respiratory Denies history of Aspiration, Asthma, Chronic Obstructive Pulmonary Disease (COPD), Pneumothorax, Sleep Apnea, Tuberculosis Cardiovascular Denies history of Angina, Arrhythmia, Congestive Heart Failure, Coronary Artery Disease, Deep Vein Thrombosis, Hypertension, Hypotension, Myocardial Infarction, Peripheral Arterial Disease, Peripheral Venous Disease, Phlebitis, Vasculitis Gastrointestinal Denies history of Cirrhosis , Colitis, Crohnoos, Hepatitis A, Hepatitis B, Hepatitis C Endocrine Patient has history of Type II Diabetes Denies history of Type I Diabetes Immunological Denies history of Lupus Erythematosus, Raynaudoos, Scleroderma Integumentary (Skin) Denies history of History of Burn Musculoskeletal Denies history of Gout, Rheumatoid Arthritis, Osteoarthritis, Osteomyelitis Neurologic Denies history of Dementia, Neuropathy, Quadriplegia, Paraplegia, Seizure Disorder Oncologic Denies history of Received Chemotherapy, Received Radiation Psychiatric Denies history of Anorexia/bulimia, Confinement Anxiety Hospitalization/Surgery History - 11/1-11/06/2018- sepsis foot infection. - 11/4-11/5 02 sats low respiratory distress. Objective Constitutional Hypertensive, asymptomatic. No acute distress.. Vitals Time Taken: 8:43 AM, Height: 77 in, Weight: 280 lbs, BMI: 33.2, Temperature: 98.1 F, Pulse: 87 bpm, Respiratory Rate: 18 breaths/min, Blood Pressure: 148/91 mmHg. Respiratory Normal work of breathing on room air.. General Notes: 12/07/2021: Left dorsal foot/ankle wound is closed. The right dorsal foot/ankle wound is nearly closed and just has a small open area with  some eschar and slough. The right plantar foot wound has contracted quite a bit since our last visit. It has a healthy surface with just a little bit of slough accumulation and periwound callus buildup. The left plantar foot wound is about the same size but the surface appears healthy. There is a little slough  and periwound callus on this side, as well. Integumentary (Hair, Skin) Wound #11 status is Open. Original cause of wound was Gradually Appeared. The date acquired was: 08/20/2021. The wound has been in treatment 15 weeks. The wound is located on the [place]. The wound measures 0.5cm length x 0.5cm width x 0.1cm depth; 0.196cm^2 area and 0.02cm^3 volume. There is Fat Layer (Subcutaneous Tissue) exposed. There is no tunneling or undermining noted. There is a medium amount of serosanguineous drainage noted. The wound margin is distinct with the outline attached to the wound base. There is medium (34-66%) red, pink granulation within the wound bed. There is a medium (34-66%) amount of necrotic tissue within the wound bed including Eschar and Adherent Slough. Wound #12 status is Healed - Epithelialized. Original cause of wound was Gradually Appeared. The date acquired was: 09/29/2021. The wound has been in treatment 9 weeks. The wound is located on the Left,Dorsal Foot. The wound measures 0cm length x 0cm width x 0cm depth; 0cm^2 area and 0cm^3 volume. There is Fat Layer (Subcutaneous Tissue) exposed. There is no tunneling or undermining noted. There is a none present amount of drainage noted. The wound margin is distinct with the outline attached to the wound base. There is no granulation within the wound bed. There is no necrotic tissue within the wound bed. Wound #3 status is Open. Original cause of wound was Trauma. The date acquired was: 10/02/2019. The wound has been in treatment 110 weeks. The wound is located on the [place]. The wound measures 3.5cm length x 4.2cm  width x 0.3cm depth; 11.545cm^2 area and 3.464cm^3 volume. There is Fat Layer (Subcutaneous Tissue) exposed. There is no tunneling or undermining noted. There is a medium amount of serosanguineous drainage noted. The wound margin is well defined and not attached to the wound base. There is small (1-33%) pink granulation within the wound bed. There is a medium (34-66%) amount of necrotic tissue within the wound bed including Adherent Slough. Wound #8 status is Open. Original cause of wound was Blister. The date acquired was: 04/18/2021. The wound has been in treatment 32 weeks. The wound is located on the Right,Lateral Foot. The wound measures 4cm length x 3.2cm width x 0.1cm depth; 10.053cm^2 area and 1.005cm^3 volume. There is Fat Layer (Subcutaneous Tissue) exposed. There is no undermining noted, however, there is tunneling at 9:00 with a maximum distance of 1cm. There is a large amount of serosanguineous drainage noted. The wound margin is well defined and not attached to the wound base. There is medium (34-66%) red, pink granulation within the wound bed. There is a medium (34-66%) amount of necrotic tissue within the wound bed including Adherent Slough. 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 There was a Selective/Open Wound Non-Viable Tissue Debridement with a total area of 0.25 sq cm performed by [name], MD. With the following instrument(s): Curette to remove Non-Viable tissue/material. Material removed includes Eschar  and Slough and. A time out was conducted at 09:11, prior to the start of the procedure. A Minimum amount of bleeding was controlled with Pressure. The  procedure was tolerated well with a pain level of 0 throughout and a pain level of 0 following the procedure. Post Debridement Measurements: 0.5cm length x 0.5cm width x 0.1cm depth; 0.02cm^3 volume. Character of Wound/Ulcer Post Debridement requires further debridement. Severity of Tissue Post Debridement is: Fat layer exposed. Post procedure Diagnosis Wound #11: Same as Pre-Procedure Wound #3 Pre-procedure diagnosis of Wound #3 is a Diabetic Wound/Ulcer of the Lower Extremity located on the Left,Lateral,Plantar Foot .Severity of Tissue Pre Debridement is: Fat layer exposed. There was a Selective/Open Wound Skin/Epidermis Debridement with a total area of 14.7 sq cm performed by [name], MD. With the following instrument(s): Curette to remove Non-Viable tissue/material. Material removed includes Callus, Slough, and Skin: Epidermis. A time out was conducted at 09:11, prior to the start of the procedure. A Minimum amount of bleeding was controlled with Pressure. The procedure was tolerated well with a pain level of 0 throughout and a pain level of 0 following the procedure. Post Debridement Measurements: 3.5cm length x 4.2cm width x 0.3cm depth; 3.464cm^3 volume. Character of Wound/Ulcer Post Debridement requires further debridement. Severity of Tissue Post Debridement is: Fat layer exposed. 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 There was a Selective/Open Wound Skin/Epidermis Debridement with a total area of 12.8 sq cm performed by [name], MD. With the following instrument(s): Curette to remove Non-Viable tissue/material. Material  removed includes Callus, Slough, and Skin: Epidermis. A time out was conducted at 09:11, prior to the start of the procedure. A Minimum amount of bleeding was controlled with Pressure. The procedure was tolerated well with a pain level of 0 throughout and a pain level of 0 following the procedure. Post Debridement Measurements: 4cm length x 3.2cm width x 0.1cm depth; 1.005cm^3 volume. Character of Wound/Ulcer Post Debridement requires further debridement. Severity of Tissue Post Debridement is: Fat layer exposed. Post procedure Diagnosis Wound #8: Same as Pre-Procedure Plan Follow-up Appointments: Return Appointment in 1 week. - Dr. [name] - Room 1 with [facility] Wednesday 12/15/21 at 08:15 AM Return Appointment in 2 weeks. - Dr. [name] - Room 1 with [facility] Wednesday 12/22/21 at 08:15 AM Return appointment in 3 weeks. - Dr. [name] - Room 1 with [facility] Wednesday 12/29/21 at 08:15 AM Bathing/ Shower/ Hygiene: May shower and wash wound with soap and water. Edema Control - Lymphedema / SCD / Other: Elevate legs to the level of the heart or above for 30 minutes daily and/or when sitting, a frequency of: - throughout the day Avoid standing for long periods of time. Exercise regularly Compression stocking or Garment 20-30 mm/Hg pressure to: - to both legs daily Off-Loading: Open toe surgical shoe to: - Both feet Additional Orders / Instructions: Follow Nutritious Diet WOUND #11: - Foot Wound Laterality: Right, Anterior Cleanser: Soap and Water 1 x Per Day/30 Days Discharge Instructions: May shower and wash wound with dial antibacterial soap and water prior to dressing change. Peri-Wound Care: Sween Lotion (Moisturizing lotion) 1 x Per Day/30 Days Discharge Instructions: Apply moisturizing lotion as directed Topical: keystone antibiotic compound 1 x Per Day/30 Days Prim Dressing: Promogran Prisma Matrix, 4.34 (sq in) (silver collagen) (Dispense As Written) 1 x Per Day/30 Days ary Discharge Instructions:  Moisten collagen with saline or hydrogel Secondary Dressing: ABD Pad, 8x10 (Dispense As Written) 1 x Per Day/30 Days Discharge Instructions: Apply over  primary dressing as directed. Secondary Dressing: Optifoam Non-Adhesive Dressing, 4x4 in (Dispense As Written) 1 x Per Day/30 Days Discharge Instructions: Apply over primary dressing as directed. Secondary Dressing: NonWoven Sponge, 4x4 (in/in) (Generic) 1 x Per Day/30 Days Secured With: 532M Medipore H Soft Cloth Surgical T ape, 4 x 10 (in/yd) (Generic) 1 x Per Day/30 Days Discharge Instructions: Secure with tape as directed. Com pression Wrap: Kerlix Roll 4.5x3.1 (in/yd) 1 x Per Day/30 Days Discharge Instructions: Apply Kerlix and Coban compression as directed. Com pression Wrap: 532M ACE Elastic Bandage With VELCRO Brand Closure, 4 (in) (Generic) 1 x Per Day/30 Days WOUND #3: - Foot Wound Laterality: Plantar, Left, Lateral Cleanser: Soap and Water 1 x Per Day/30 Days Discharge Instructions: May shower and wash wound with dial antibacterial soap and water prior to dressing change. Peri-Wound Care: Sween Lotion (Moisturizing lotion) 1 x Per Day/30 Days Discharge Instructions: Apply moisturizing lotion as directed Topical: keystone antibiotic compound 1 x Per Day/30 Days Prim Dressing: Promogran Prisma Matrix, 4.34 (sq in) (silver collagen) (Dispense As Written) 1 x Per Day/30 Days ary Discharge Instructions: Moisten collagen with saline or hydrogel Secondary Dressing: ABD Pad, 8x10 (Dispense As Written) 1 x Per Day/30 Days Discharge Instructions: Apply over primary dressing as directed. Secondary Dressing: Optifoam Non-Adhesive Dressing, 4x4 in (Dispense As Written) 1 x Per Day/30 Days Discharge Instructions: Apply over primary dressing as directed. Secondary Dressing: NonWoven Sponge, 4x4 (in/in) (Generic) 1 x Per Day/30 Days Secured With: 532M Medipore H Soft Cloth Surgical T ape, 4 x 10 (in/yd) (Generic) 1 x Per Day/30 Days Discharge  Instructions: Secure with tape as directed. Com pression Wrap: Kerlix Roll 4.5x3.1 (in/yd) 1 x Per Day/30 Days Discharge Instructions: Apply Kerlix and Coban compression as directed. Com pression Wrap: 532M ACE Elastic Bandage With VELCRO Brand Closure, 4 (in) (Generic) 1 x Per Day/30 Days WOUND #8: - Foot Wound Laterality: Right, Lateral Cleanser: Soap and Water 1 x Per Day/30 Days Discharge Instructions: May shower and wash wound with dial antibacterial soap and water prior to dressing change. Peri-Wound Care: Sween Lotion (Moisturizing lotion) 1 x Per Day/30 Days Discharge Instructions: Apply moisturizing lotion as directed Topical: keystone antibiotic compound 1 x Per Day/30 Days Prim Dressing: Promogran Prisma Matrix, 4.34 (sq in) (silver collagen) (Dispense As Written) 1 x Per Day/30 Days ary Discharge Instructions: Moisten collagen with saline or hydrogel Secondary Dressing: ABD Pad, 8x10 (Dispense As Written) 1 x Per Day/30 Days Discharge Instructions: Apply over primary dressing as directed. Secondary Dressing: Optifoam Non-Adhesive Dressing, 4x4 in (Dispense As Written) 1 x Per Day/30 Days Discharge Instructions: Apply over primary dressing as directed. Secondary Dressing: NonWoven Sponge, 4x4 (in/in) (Generic) 1 x Per Day/30 Days Secured With: 532M Medipore H Soft Cloth Surgical T ape, 4 x 10 (in/yd) (Generic) 1 x Per Day/30 Days Discharge Instructions: Secure with tape as directed. Com pression Wrap: Kerlix Roll 4.5x3.1 (in/yd) 1 x Per Day/30 Days Discharge Instructions: Apply Kerlix and Coban compression as directed. Com pression Wrap: 532M ACE Elastic Bandage With VELCRO Brand Closure, 4 (in) (Generic) 1 x Per Day/30 Days 12/07/2021: Left dorsal foot/ankle wound is closed. The right dorsal foot/ankle wound is nearly closed and just has a small open area with some eschar and slough. The right plantar foot wound has contracted quite a bit since our last visit. It has a healthy surface  with just a little bit of slough accumulation and periwound callus buildup. The left plantar foot wound is about the same size but  the surface appears healthy. There is a little slough and periwound callus on this side, as well. I used a curette to debride slough and periwound callus from both of the plantar foot wounds. I debrided eschar and slough from the dorsal foot wound. We will continue to use Prisma silver collagen. Follow-up in 1 week. Electronic Signature(s) Signed: 12/07/2021 9:23:20 AM By: [name] MD FACS Entered By: [name] on 12/07/2021 09:23:20 -------------------------------------------------------------------------------- HxROS Details Patient Name: Date of Service: [name], [name] 12/07/2021 9:00 A M Medical Record Number: [id number] Patient Account Number: [id number] Date of Birth/Sex: Treating RN: Aug 08, 1985 (36 y.o. M) Primary Care Provider: [name] Other Clinician: Referring Provider: Treating Provider/Extender: [name] in Treatment: 110 Information Obtained From Patient Eyes Medical History: Negative for: Cataracts; Glaucoma; Optic Neuritis Ear/Nose/Mouth/Throat Medical History: Negative for: Chronic sinus problems/congestion; Middle ear problems Hematologic/Lymphatic Medical History: Negative for: Anemia; Hemophilia; Human Immunodeficiency Virus; Lymphedema; Sickle Cell Disease Respiratory Medical History: Negative for: Aspiration; Asthma; Chronic Obstructive Pulmonary Disease (COPD); Pneumothorax; Sleep Apnea; Tuberculosis Cardiovascular Medical History: Negative for: Angina; Arrhythmia; Congestive Heart Failure; Coronary Artery Disease; Deep Vein Thrombosis; Hypertension; Hypotension; Myocardial Infarction; Peripheral Arterial Disease; Peripheral Venous Disease; Phlebitis; Vasculitis Gastrointestinal Medical History: Negative for: Cirrhosis ; Colitis; Crohns; Hepatitis A; Hepatitis B; Hepatitis  C Endocrine Medical History: Positive for: Type II Diabetes Negative for: Type I Diabetes Time with diabetes: 8 Treated with: Insulin Blood sugar tested every day: No Immunological Medical History: Negative for: Lupus Erythematosus; Raynauds; Scleroderma Integumentary (Skin) Medical History: Negative for: History of Burn Musculoskeletal Medical History: Negative for: Gout; Rheumatoid Arthritis; Osteoarthritis; Osteomyelitis Neurologic Medical History: Negative for: Dementia; Neuropathy; Quadriplegia; Paraplegia; Seizure Disorder Oncologic Medical History: Negative for: Received Chemotherapy; Received Radiation Psychiatric Medical History: Negative for: Anorexia/bulimia; Confinement Anxiety Immunizations Pneumococcal Vaccine: Received Pneumococcal Vaccination: No Implantable Devices None Hospitalization / Surgery History Type of Hospitalization/Surgery 11/1-11/06/2018- sepsis foot infection 11/4-11/5 02 sats low respiratory distress Family and Social History Cancer: No; Diabetes: No; Hereditary Spherocytosis: No; Hypertension: No; Kidney Disease: No; Lung Disease: No; Seizures: No; Stroke: No; Thyroid Problems: No; Tuberculosis: No; Never smoker; Marital Status - Single; Alcohol Use: Rarely; Drug Use: No History; Caffeine Use: Never; Financial Concerns: No; Food, Clothing or Shelter Needs: No; Support System Lacking: No; Transportation Concerns: No Electronic Signature(s) Signed: 12/07/2021 10:10:13 AM By: [name] MD FACS Entered By: [name] on 12/07/2021 09:22:06 -------------------------------------------------------------------------------- SuperBill Details Patient Name: Date of Service: [name], [name] 12/07/2021 Medical Record Number: [id number] Patient Account Number: [id number] Date of Birth/Sex: Treating RN: 1985/06/26 (36 y.o. M) Primary Care Provider: [name] Other Clinician: Referring Provider: Treating Provider/Extender: [name] in Treatment: 110 Diagnosis Coding ICD-10 Codes Code Description E11.621 Type 2 diabetes mellitus with foot ulcer L97.528 Non-pressure chronic ulcer of other part of left foot with other specified severity L97.518 Non-pressure chronic ulcer of other part of right foot with other specified severity L97.322 Non-pressure chronic ulcer of left ankle with fat layer exposed Facility Procedures CPT4 Code: 13244010 Description: [id number] - DEBRIDE WOUND 1ST 20 SQ CM OR < ICD-10 Diagnosis Description L97.528 Non-pressure chronic ulcer of other part of left foot with other specified severi L97.518 Non-pressure chronic ulcer of other part of right foot with other  specified sever L97.322 Non-pressure chronic ulcer of left ankle with fat layer exposed Modifier: ty ity Quantity: 1 CPT4 Code: [id number] Description: 42595 - DEBRIDE WOUND EA ADDL 20 SQ CM ICD-10 Diagnosis Description L97.528 Non-pressure chronic ulcer  of other part of left foot with other specified severi L97.518 Non-pressure chronic ulcer of other part of right foot with other specified  sever L97.322 Non-pressure chronic ulcer of left ankle with fat layer exposed Modifier: ty ity Quantity: 1 Physician Procedures : CPT4 Code Description Modifier [id number] - WC PHYS LEVEL 3 - EST PT 25 ICD-10 Diagnosis Description L97.528 Non-pressure chronic ulcer of other part of left foot with other specified severity L97.518 Non-pressure chronic ulcer of other part of  right foot with other specified severity L97.322 Non-pressure chronic ulcer of left ankle with fat layer exposed E11.621 Type 2 diabetes mellitus with foot ulcer Quantity: 1 : [id number] - WC PHYS DEBR WO ANESTH 20 SQ CM ICD-10 Diagnosis Description L97.528 Non-pressure chronic ulcer of other part of left foot with other specified severity L97.518 Non-pressure chronic ulcer of other part of right foot with other specified  severity L97.322 Non-pressure  chronic ulcer of left ankle with fat layer exposed Quantity: 1 : [id number] - WC PHYS DEBR WO ANESTH EA ADD 20 CM ICD-10 Diagnosis Description L97.528 Non-pressure chronic ulcer of other part of left foot with other specified severity L97.518 Non-pressure chronic ulcer of other part of right foot with other  specified severity L97.322 Non-pressure chronic ulcer of left ankle with fat layer exposed Quantity: 1 Electronic Signature(s) Signed: 12/07/2021 9:23:48 AM By: [name] MD FACS Entered By: [name] on 12/07/2021 09:23:47

## 2021-12-15 ENCOUNTER — Encounter (HOSPITAL_BASED_OUTPATIENT_CLINIC_OR_DEPARTMENT_OTHER): Payer: BC Managed Care – PPO | Admitting: General Surgery

## 2021-12-15 DIAGNOSIS — E11621 Type 2 diabetes mellitus with foot ulcer: Secondary | ICD-10-CM | POA: Diagnosis not present

## 2021-12-15 NOTE — Progress Notes (Signed)
Max, [name] ([id number]) Visit Report for 12/15/2021 Arrival Information Details Patient Name: Date of Service: [name] 12/15/2021 8:15 A M Medical Record Number: [id number] Patient Account Number: [id number] Date of Birth/Sex: Treating RN: [birth year] (36 y.o. [name] Primary Care [name]: [name] Other Clinician: Referring Latausha Flamm: Treating Mong Neal/Extender: [name] in Treatment: 67 Visit Information History Since Last Visit Added or deleted any medications: No Patient Arrived: Ambulatory Any new allergies or adverse reactions: No Arrival Time: 08:37 Had a fall or experienced change in No Accompanied By: friend activities of daily living that may affect Transfer Assistance: None risk of falls: Patient Identification Verified: Yes Signs or symptoms of abuse/neglect since last visito No Secondary Verification Process Completed: Yes Hospitalized since last visit: No Patient Requires Transmission-Based Precautions: No Implantable device outside of the clinic excluding No Patient Has Alerts: No cellular tissue based products placed in the center since last visit: Has Dressing in Place as Prescribed: Yes Pain Present Now: No Electronic Signature(s) Signed: 12/15/2021 3:40:40 PM By: [name] RN, BSN Entered By: [name] on 12/15/2021 08:40:35 -------------------------------------------------------------------------------- Encounter Discharge Information Details Patient Name: Date of Service: [name], [name] 12/15/2021 8:15 A M Medical Record Number: [id number] Patient Account Number: [id number] Date of Birth/Sex: Treating RN: March 01, 1986 (36 y.o. [name] Primary Care Vladimir Lenhoff: [name] Other Clinician: Referring Shamyia Grandpre: Treating Undrea Archbold/Extender: [name] in Treatment: 111 Encounter Discharge Information Items Post Procedure Vitals Discharge Condition:  Stable Temperature (F): 98.5 Ambulatory Status: Ambulatory Pulse (bpm): 89 Discharge Destination: Home Respiratory Rate (breaths/min): 18 Transportation: Private Auto Blood Pressure (mmHg): 148/94 Accompanied By: friend Schedule Follow-up Appointment: Yes Clinical Summary of Care: Patient Declined Electronic Signature(s) Signed: 12/15/2021 3:40:40 PM By: [name] RN, BSN Entered By: [name] on 12/15/2021 09:22:23 -------------------------------------------------------------------------------- Lower Extremity Assessment Details Patient Name: Date of Service: [name], [name] 12/15/2021 8:15 A M Medical Record Number: [id number] Patient Account Number: [id number] Date of Birth/Sex: Treating RN: 03/01/1986 (36 y.o. [name] Primary Care Bronsen Serano: [name] Other Clinician: Referring Zaelyn Barbary: Treating Petr Bontempo/Extender: [name] in Treatment: 111 Edema Assessment Assessed: [name]: No] [name]: No] Edema: [Left: Yes] [Right: Yes] Calf Left: Right: Point of Measurement: 48 cm From Medial Instep 49 cm 49 cm Ankle Left: Right: Point of Measurement: 11 cm From Medial Instep 28 cm 29.5 cm Vascular Assessment Pulses: Dorsalis Pedis Palpable: [Left:Yes] [Right:Yes] Electronic Signature(s) Signed: 12/15/2021 3:40:40 PM By: [name] RN, BSN Entered By: [name] on 12/15/2021 08:47:52 -------------------------------------------------------------------------------- Multi Wound Chart Details Patient Name: Date of Service: [name], [name] 12/15/2021 8:15 A M Medical Record Number: [id number] Patient Account Number: [id number] Date of Birth/Sex: Treating RN: [birth year] (36 y.o. M) Primary Care Shloimy Michalski: [name] Other Clinician: Referring Waylin Dorko: Treating Apoorva Bugay/Extender: [name] in Treatment: 111 Vital Signs Height(in): 77 Pulse(bpm): 89 Weight(lbs): 280 Blood  Pressure(mmHg): 148/94 Body Mass Index(BMI): 33.2 Temperature(F): 98.5 Respiratory Rate(breaths/min): 18 Photos: [11:Right, Anterior Foot] [3:Left, Lateral, Plantar Foot] [8:Right, Lateral Foot] Wound Location: [11:Gradually Appeared] [3:Trauma] [8:Blister] Wounding Event: [11:Diabetic Wound/Ulcer of the Lower] [3:Diabetic Wound/Ulcer of the Lower] Primary Etiology: [11:Extremity Type II Diabetes] [3:Extremity Type II Diabetes] [8:Extremity Type II Diabetes] Comorbid History: [11:08/20/2021] [3:10/02/2019] [8:04/18/2021] Date Acquired: [11:16] [3:111] [8:34] Weeks of Treatment: [11:Healed - Epithelialized] [3:Open] [8:Open] Wound Status: [11:No] [3:No] [8:No] Wound Recurrence: [11:0x0x0] [3:3.2x3.8x0.3] [8:2.2x2.4x0.1] Measurements L x W x [name] [11:0] [3:9.55] [8:4.147] A (cm) : rea [11:0] [  3:2.865] [8:0.415] Volume (cm) : [11:100.00%] [3:-479.10%] [8:-654.00%] % Reduction in A [11:rea: 100.00%] [3:-1636.40%] [8:-277.30%] % Reduction in Volume: [8:9] Position 1 (o'clock): [8:0.3] Maximum Distance 1 (cm): [11:No] [3:No] [8:Yes] Tunneling: [11:Grade 2] [3:Grade 2] [8:Grade 2] Classification: [11:None Present] [3:Medium] [8:Large] Exudate A mount: [11:N/A] [3:Serosanguineous] [8:Serosanguineous] Exudate Type: [11:N/A] [3:red, brown] [8:red, brown] Exudate Color: [11:Distinct, outline attached] [3:Well defined, not attached] [8:Well defined, not attached] Wound Margin: [11:None Present (0%)] [3:Small (1-33%)] [8:Large (67-100%)] Granulation A mount: [11:N/A] [3:Pink, Hyper-granulation] [8:Red, Pink] Granulation Quality: [11:None Present (0%)] [3:Medium (34-66%)] [8:Small (1-33%)] Necrotic A mount: [11:Fascia: No] [3:Fat Layer (Subcutaneous Tissue): Yes Fat Layer (Subcutaneous Tissue): Yes] Exposed Structures: [11:Fat Layer (Subcutaneous Tissue): No Fascia: No Tendon: No Muscle: No Joint: No Bone: No Small (1-33%)] [3:Tendon: No Muscle: No Joint: No Bone: No Small (1-33%)] [8:Fascia:  No Tendon: No Muscle: No Joint: No Bone: No Small (1-33%)] Epithelialization: [11:N/A] [3:Debridement - Selective/Open Wound Debridement - Excisional] Debridement: Pre-procedure Verification/Time Out N/A [3:09:01] [8:09:01] Taken: [11:N/A] [3:Slough] [8:Subcutaneous, Slough] Tissue Debrided: [11:N/A] [3:Non-Viable Tissue] [8:Skin/Subcutaneous Tissue] Level: [11:N/A] [3:12.16] [8:5.28] Debridement A (sq cm): [11:rea N/A] [3:Curette] [8:Curette] Instrument: [11:N/A] [3:Minimum] [8:Minimum] Bleeding: [11:N/A] [3:Pressure] [8:Pressure] Hemostasis A chieved: [11:N/A] [3:0] [8:0] Procedural Pain: [11:N/A] [3:0] [8:0] Post Procedural Pain: [11:N/A] [3:Procedure was tolerated well] [8:Procedure was tolerated well] Debridement Treatment Response: [11:N/A] [3:3.2x3.8x0.3] [8:2.2x2.4x0.1] Post Debridement Measurements L x W x [name] [11:N/A] [3:2.865] [8:0.415] Post Debridement Volume: (cm) [11:N/A] [3:Debridement] [8:Debridement] Treatment Notes Electronic Signature(s) Signed: 12/15/2021 9:17:41 AM By: [name] MD FACS Entered By: [name] on 12/15/2021 09:17:41 -------------------------------------------------------------------------------- Multi-Disciplinary Care Plan Details Patient Name: Date of Service: [name], [name] 12/15/2021 8:15 A M Medical Record Number: [id number] Patient Account Number: [id number] Date of Birth/Sex: Treating RN: [birth year] (36 y.o. [name] Primary Care Lamount Bankson: [name] Other Clinician: Referring Sevanna Ballengee: Treating Cristy Colmenares/Extender: [name] in Treatment: [place] reviewed with physician Active Inactive Nutrition Nursing Diagnoses: Imbalanced nutrition Potential for alteratiion in Nutrition/Potential for imbalanced nutrition Goals: Patient/caregiver agrees to and verbalizes understanding of need to use nutritional supplements and/or vitamins as prescribed Date  Initiated: 10/24/2019 Date Inactivated: 04/06/2020 Target Resolution Date: 04/03/2020 Goal Status: Met Patient/caregiver will maintain therapeutic glucose control Date Initiated: 10/24/2019 Target Resolution Date: 12/22/2021 Goal Status: Active Interventions: Assess HgA1c results as ordered upon admission and as needed Assess patient nutrition upon admission and as needed per policy Provide education on elevated blood sugars and impact on wound healing Provide education on nutrition Treatment Activities: Education provided on Nutrition : 12/07/2021 Notes: 11/17/20: Glucose control ongoing issue, target date extended. 01/26/21: Glucose management continues. Wound/Skin Impairment Nursing Diagnoses: Impaired tissue integrity Knowledge deficit related to ulceration/compromised skin integrity Goals: Patient/caregiver will verbalize understanding of skin care regimen Date Initiated: 10/24/2019 Target Resolution Date: 12/22/2021 Goal Status: Active Ulcer/skin breakdown will have a volume reduction of 30% by week 4 Date Initiated: 10/24/2019 Date Inactivated: 01/16/2020 Target Resolution Date: 01/10/2020 Unmet Reason: no change in Goal Status: Unmet measurements. Interventions: Assess patient/caregiver ability to obtain necessary supplies Assess patient/caregiver ability to perform ulcer/skin care regimen upon admission and as needed Assess ulceration(s) every visit Provide education on ulcer and skin care Notes: 11/17/20: Wound care regimen continues Electronic Signature(s) Signed: 12/15/2021 3:40:40 PM By: [name] RN, BSN Entered By: [name] on 12/15/2021 08:59:56 -------------------------------------------------------------------------------- Pain Assessment Details Patient Name: Date of Service: [name], [name] 12/15/2021 8:15 A M Medical Record Number: [id number] Patient Account Number: [id number] Date of Birth/Sex:  Treating RN: March 18, 1986 (36 y.o. [name] Primary Care Estevan Kersh: [name] Other Clinician: Referring Caelan Atchley: Treating Syniyah Bourne/Extender: [name] in Treatment: 111 Active Problems Location of Pain Severity and Description of Pain Patient Has Paino No Site Locations Pain Management and Medication Current Pain Management: Electronic Signature(s) Signed: 12/15/2021 3:40:40 PM By: [name] RN, BSN Entered By: [name] on 12/15/2021 08:43:11 -------------------------------------------------------------------------------- Patient/Caregiver Education Details Patient Name: Date of Service: [name], [name] 9/13/2023andnbsp8:15 [place] Record Number: [id number] Patient Account Number: [id number] Date of Birth/Gender: Treating RN: 1986-01-31 (36 y.o. [name] Primary Care Physician: [name] Other Clinician: Referring Physician: Treating Physician/Extender: [name] in Treatment: 111 Education Assessment Education Provided To: Patient Education Topics Provided Wound/Skin Impairment: Methods: Explain/Verbal Responses: State content correctly [employer]) Signed: 12/15/2021 3:40:40 PM By: [name] RN, BSN Entered By: [name] on 12/15/2021 09:00:15 -------------------------------------------------------------------------------- Wound Assessment Details Patient Name: Date of Service: [name], [name] 12/15/2021 8:15 A M Medical Record Number: [id number] Patient Account Number: [id number] Date of Birth/Sex: Treating RN: [birth year] (36 y.o. [name] Primary Care Chanler Mendonca: [name] Other Clinician: Referring Margie Urbanowicz: Treating Rosemond Lyttle/Extender: [name] in Treatment: 111 Wound Status Wound Number: 11 Primary Etiology: Diabetic Wound/Ulcer of the Lower Extremity Wound Location: Right, Anterior Foot Wound Status: Healed - Epithelialized Wounding  Event: Gradually Appeared Comorbid History: Type II Diabetes Date Acquired: 08/20/2021 Weeks Of Treatment: 16 Clustered Wound: No Photos Wound Measurements Length: (cm) Width: (cm) Depth: (cm) Area: (cm) Volume: (cm) 0 % Reduction in Area: 100% 0 % Reduction in Volume: 100% 0 Epithelialization: Small (1-33%) 0 Tunneling: No 0 Undermining: No Wound Description Classification: Grade 2 Wound Margin: Distinct, outline attached Exudate Amount: None Present Foul Odor After Cleansing: No Slough/Fibrino No Wound Bed Granulation Amount: None Present (0%) Exposed Structure Necrotic Amount: None Present (0%) Fascia Exposed: No Fat Layer (Subcutaneous Tissue) Exposed: No Tendon Exposed: No Muscle Exposed: No Joint Exposed: No Bone Exposed: No Electronic Signature(s) Signed: 12/15/2021 3:40:40 PM By: [name] RN, BSN Entered By: [name] on 12/15/2021 08:52:12 -------------------------------------------------------------------------------- Wound Assessment Details Patient Name: Date of Service: [name], [name] 12/15/2021 8:15 A M Medical Record Number: [id number] Patient Account Number: [id number] Date of Birth/Sex: Treating RN: 08/29/1985 (36 y.o. [name] Primary Care Eisa Conaway: [name] Other Clinician: Referring Jeyson Deshotel: Treating Maryam Feely/Extender: [name] in Treatment: 111 Wound Status Wound Number: 3 Primary Etiology: Diabetic Wound/Ulcer of the Lower Extremity Wound Location: Left, Lateral, Plantar Foot Wound Status: Open Wounding Event: Trauma Comorbid History: Type II Diabetes Date Acquired: 10/02/2019 Weeks Of Treatment: 111 Clustered Wound: No Photos Wound Measurements Length: (cm) 3.2 Width: (cm) 3.8 Depth: (cm) 0.3 Area: (cm) 9.55 Volume: (cm) 2.865 % Reduction in Area: -479.1% % Reduction in Volume: -1636.4% Epithelialization: Small (1-33%) Tunneling: No Undermining: No Wound  Description Classification: Grade 2 Wound Margin: Well defined, not attached Exudate Amount: Medium Exudate Type: Serosanguineous Exudate Color: red, brown Foul Odor After Cleansing: No Slough/Fibrino Yes Wound Bed Granulation Amount: Small (1-33%) Exposed Structure Granulation Quality: Pink, Hyper-granulation Fascia Exposed: No Necrotic Amount: Medium (34-66%) Fat Layer (Subcutaneous Tissue) Exposed: Yes Necrotic Quality: Adherent Slough Tendon Exposed: No Muscle Exposed: No Joint Exposed: No Bone Exposed: No Treatment Notes Wound #3 (Foot) Wound Laterality: Plantar, Left, Lateral Cleanser Soap and Water Discharge Instruction: May shower and wash wound with dial antibacterial soap and water prior to dressing change. Peri-Wound  Care Sween Lotion (Moisturizing lotion) Discharge Instruction: Apply moisturizing lotion as directed Topical keystone antibiotic compound Primary Dressing Promogran Prisma Matrix, 4.34 (sq in) (silver collagen) Discharge Instruction: Moisten collagen with saline or hydrogel Secondary Dressing ABD Pad, 8x10 Discharge Instruction: Apply over primary dressing as directed. Optifoam Non-Adhesive Dressing, 4x4 in Discharge Instruction: Apply over primary dressing as directed. NonWoven Sponge, 4x4 (in/in) Secured With 28M Medipore H Soft Cloth Surgical T ape, 4 x 10 (in/yd) Discharge Instruction: Secure with tape as directed. Compression Wrap Kerlix Roll 4.5x3.1 (in/yd) Discharge Instruction: Apply Kerlix and Coban compression as directed. 28M ACE Elastic Bandage With VELCRO Brand Closure, 4 (in) Compression Stockings Add-Ons Electronic Signature(s) Signed: 12/15/2021 3:40:40 PM By: [name] RN, BSN Entered By: [name] on 12/15/2021 08:58:02 -------------------------------------------------------------------------------- Wound Assessment Details Patient Name: Date of Service: [name], [name] 12/15/2021 8:15 A M Medical Record Number:  [id number] Patient Account Number: [id number] Date of Birth/Sex: Treating RN: Dec 13, 1985 (35 y.o. [name] Primary Care Aydden Cumpian: [name] Other Clinician: Referring Philicia Heyne: Treating Elayah Klooster/Extender: [name] in Treatment: 111 Wound Status Wound Number: 8 Primary Etiology: Diabetic Wound/Ulcer of the Lower Extremity Wound Location: Right, Lateral Foot Wound Status: Open Wounding Event: Blister Comorbid History: Type II Diabetes Date Acquired: 04/18/2021 Weeks Of Treatment: 34 Clustered Wound: No Photos Wound Measurements Length: (cm) 2.2 Width: (cm) 2.4 Depth: (cm) 0.1 Area: (cm) 4.147 Volume: (cm) 0.415 % Reduction in Area: -654% % Reduction in Volume: -277.3% Epithelialization: Small (1-33%) Tunneling: Yes Position (o'clock): 9 Maximum Distance: (cm) 0.3 Undermining: No Wound Description Classification: Grade 2 Wound Margin: Well defined, not attached Exudate Amount: Large Exudate Type: Serosanguineous Exudate Color: red, brown Foul Odor After Cleansing: No Slough/Fibrino Yes Wound Bed Granulation Amount: Large (67-100%) Exposed Structure Granulation Quality: Red, Pink Fascia Exposed: No Necrotic Amount: Small (1-33%) Fat Layer (Subcutaneous Tissue) Exposed: Yes Necrotic Quality: Adherent Slough Tendon Exposed: No Muscle Exposed: No Joint Exposed: No Bone Exposed: No Treatment Notes Wound #8 (Foot) Wound Laterality: Right, Lateral Cleanser Soap and Water Discharge Instruction: May shower and wash wound with dial antibacterial soap and water prior to dressing change. Peri-Wound Care Sween Lotion (Moisturizing lotion) Discharge Instruction: Apply moisturizing lotion as directed Topical keystone antibiotic compound Primary Dressing Promogran Prisma Matrix, 4.34 (sq in) (silver collagen) Discharge Instruction: Moisten collagen with saline or hydrogel Secondary Dressing ABD Pad, 8x10 Discharge Instruction:  Apply over primary dressing as directed. Optifoam Non-Adhesive Dressing, 4x4 in Discharge Instruction: Apply over primary dressing as directed. NonWoven Sponge, 4x4 (in/in) Secured With 28M Medipore H Soft Cloth Surgical T ape, 4 x 10 (in/yd) Discharge Instruction: Secure with tape as directed. Compression Wrap Kerlix Roll 4.5x3.1 (in/yd) Discharge Instruction: Apply Kerlix and Coban compression as directed. 28M ACE Elastic Bandage With VELCRO Brand Closure, 4 (in) Compression Stockings Add-Ons Electronic Signature(s) Signed: 12/15/2021 3:40:40 PM By: [name] RN, BSN Entered By: [name] on 12/15/2021 08:56:54 -------------------------------------------------------------------------------- Vitals Details Patient Name: Date of Service: [name], [name] 12/15/2021 8:15 A M Medical Record Number: [id number] Patient Account Number: [id number] Date of Birth/Sex: Treating RN: Sep 29, 1985 (36 y.o. [name] Primary Care Shadia Larose: [name] Other Clinician: Referring Janye Maynor: Treating Beckham Capistran/Extender: [name] in Treatment: 111 Vital Signs Time Taken: 08:42 Temperature (F): 98.5 Height (in): 77 Pulse (bpm): 89 Weight (lbs): 280 Respiratory Rate (breaths/min): 18 Body Mass Index (BMI): 33.2 Blood Pressure (mmHg): 148/94 Reference Range: 80 - 120 mg / dl Electronic Signature(s) Signed: 12/15/2021 3:40:40 PM  By: [name] RN, BSN Entered By: [name] on 12/15/2021 08:43:05

## 2021-12-15 NOTE — Progress Notes (Signed)
[name], [name] ([id number]) Visit Report for 12/15/2021 Chief Complaint Document Details Patient Name: Date of Service: [name] 12/15/2021 8:15 A M Medical Record Number: [id number] Patient Account Number: [id number] Date of Birth/Sex: Treating RN: [birth year] (36 y.o. M) Primary Care Provider: [name] Other Clinician: Referring Provider: Treating Provider/Extender: [name] in Treatment: [place] from: Patient Chief Complaint 01/11/2019; patient is here for review of a rather substantial wound over the left fifth plantar metatarsal head extending into the lateral part of his foot 10/24/2019; patient returns to clinic with wounds on his bilateral feet with underlying osteomyelitis biopsy-proven Electronic Signature(s) Signed: 12/15/2021 9:17:49 AM By: [name] MD FACS Entered By: [name] on 12/15/2021 09:17:49 -------------------------------------------------------------------------------- Debridement Details Patient Name: Date of Service: [name], [name] 12/15/2021 8:15 A M Medical Record Number: [id number] Patient Account Number: [id number] Date of Birth/Sex: Treating RN: Sep 12, 1985 (36 y.o. [name] Primary Care Provider: [name] Other Clinician: Referring Provider: Treating Provider/Extender: [name] in Treatment: 111 Debridement Performed for Assessment: Wound #3 Left,Lateral,Plantar Foot Performed By: Physician [name], MD Debridement Type: Debridement Severity of Tissue Pre Debridement: Fat layer exposed Level of Consciousness (Pre-procedure): Awake and Alert Pre-procedure Verification/Time Out Yes - 09:01 Taken: Start Time: 09:04 T Area Debrided (L x W): otal 3.2 (cm) x 3.8 (cm) = 12.16 (cm) Tissue and other material debrided: Non-Viable, Slough, Slough Level: Non-Viable Tissue Debridement Description: Selective/Open Wound Instrument:  Curette Bleeding: Minimum Hemostasis Achieved: Pressure Procedural Pain: 0 Post Procedural Pain: 0 Response to Treatment: Procedure was tolerated well Level of Consciousness (Post- Awake and Alert procedure): Post Debridement Measurements of Total Wound Length: (cm) 3.2 Width: (cm) 3.8 Depth: (cm) 0.3 Volume: (cm) 2.865 Character of Wound/Ulcer Post Debridement: Improved Severity of Tissue Post Debridement: Fat layer exposed Post Procedure Diagnosis Same as Pre-procedure Notes scribed for Dr [name] by [name] Rn Electronic Signature(s) Signed: 12/15/2021 2:52:30 PM By: [name] MD FACS Signed: 12/15/2021 3:40:40 PM By: [name] RN, BSN Previous Signature: 12/15/2021 10:10:05 AM Version By: [name] MD FACS Entered By: [name] on 12/15/2021 12:55:25 -------------------------------------------------------------------------------- Debridement Details Patient Name: Date of Service: [name], [name] 12/15/2021 8:15 A M Medical Record Number: [id number] Patient Account Number: [id number] Date of Birth/Sex: Treating RN: January 05, 1986 (36 y.o. [name] Primary Care Provider: [name] Other Clinician: Referring Provider: Treating Provider/Extender: [name] in Treatment: 111 Debridement Performed for Assessment: Wound #8 Right,Lateral Foot Performed By: Physician [name], MD Debridement Type: Debridement Severity of Tissue Pre Debridement: Fat layer exposed Level of Consciousness (Pre-procedure): Awake and Alert Pre-procedure Verification/Time Out Yes - 09:01 Taken: Start Time: 09:04 T Area Debrided (L x W): otal 2.2 (cm) x 2.4 (cm) = 5.28 (cm) Tissue and other material debrided: Non-Viable, Slough, Subcutaneous, Slough Level: Skin/Subcutaneous Tissue Debridement Description: Excisional Instrument: Curette Bleeding: Minimum Hemostasis Achieved: Pressure Procedural Pain: 0 Post Procedural  Pain: 0 Response to Treatment: Procedure was tolerated well Level of Consciousness (Post- Awake and Alert procedure): Post Debridement Measurements of Total Wound Length: (cm) 2.2 Width: (cm) 2.4 Depth: (cm) 0.1 Volume: (cm) 0.415 Character of Wound/Ulcer Post Debridement: Improved Severity of Tissue Post Debridement: Fat layer exposed Post Procedure Diagnosis Same as Pre-procedure Notes scribed for Dr [name] by [name] Rn Electronic Signature(s) Signed: 12/15/2021 2:52:30 PM By: [name] MD FACS Signed: 12/15/2021 3:40:40 PM By: [name] RN, BSN Previous Signature: 12/15/2021 10:10:05 AM Version By: [name]  Anderson Malta MD FACS Entered By: [name] on 12/15/2021 12:55:41 -------------------------------------------------------------------------------- HPI Details Patient Name: Date of Service: [name] 12/15/2021 8:15 A M Medical Record Number: [id number] Patient Account Number: [id number] Date of Birth/Sex: Treating RN: 03/23/1986 (36 y.o. 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 He comes in for review of a wound over the plantar fifth metatarsal head extending into the lateral part of the foot. He was followed for this previously by his podiatrist Dr. Cornelius Moras. As the patient tells his story he went to see podiatry first for a swelling he developed on the lateral part of his fifth metatarsal head in May. He states this was "open" by podiatry and the area closed. He was followed up in June and it was again opened callus removed and it closed promptly. There were plans being made for surgery on the fifth metatarsal head in June however his blood sugar was apparently too high for  anesthesia. Apparently the area was debrided and opened again in June and it is never closed since. Looking over the records from podiatry I am really not able to follow this. It was clear when he was first seen it was before 5/14 at that point he already had a wound. By 5/17 the ulcer was resolved. I do not see anything about a procedure. On 5/28 noted to have pre-ulcerative moderate keratosis. X-ray noted 1/5 contracted toe and tailor's bunion and metatarsal deformity. On a visit date on 09/28/2018 the dorsal part of the left foot it healed and resolved. There was concern about swelling in his lower extremity he was sent to the ER.. As far as I can tell he was seen in the ER on 7/12 with an ulcer on his left foot. A DVT rule out of the left leg was negative. I do not think I have complete records from podiatry but I am not able to verify the procedures this patient states he had. He states after the last procedure the wound has never closed although I am not able to follow this in the records I have from podiatry. He has not had a recent x-ray The patient has been using Neosporin on the wound. He is wearing a Darco shoe. He is still very active up on his foot working and exercising. Past medical history; type 2 diabetes ketosis-prone, leg swelling with a negative DVT study in July. Non-smoker ABI in our clinic was 0.85 on the left 10/16; substantial wound on the plantar left fifth met head extending laterally almost to the dorsal fifth MTP. We have been using silver alginate we gave him a [place] forefoot off loader. An x-ray did not show evidence of osteomyelitis did note soft tissue emphysema which I think was due to gas tracking through an open wound. There is no doubt in my mind he requires an MRI 10/23; MRI not booked until 3 November at the earliest this is largely due to his glucose sensor in the right arm. We have been using silver alginate. There has been an improvement 10/29; I am still not  exactly sure when his MRI is booked for. He says it is the third but it is the 10th in epic. This definitely needs to be done. He is running a low-grade fever today but no other  symptoms. No real improvement in the 1 02/26/2019 patient presents today for a follow-up visit here in our clinic he is last been seen in the clinic on October 29. Subsequently we were working on getting MRI to evaluate and see what exactly was going on and where we would need to go from the standpoint of whether or not he had osteomyelitis and again what treatments were going be required. Subsequently the patient ended up being admitted to the hospital on 02/07/2019 and was discharged on 02/14/2019. This is a somewhat interesting admission with a discharge diagnosis of pneumonia due to COVID-19 although he was positive for COVID-19 when tested at the urgent care but negative x2 when he was actually in the hospital. With that being said he did have acute respiratory failure with hypoxia and it was noted he also have a left foot ulceration with osteomyelitis. With that being said he did require oxygen for his pneumonia and I level 4 L. He was placed on antivirals and steroids for the COVID-19. He was also transferred to the [facility] at one point. Nonetheless he did subsequently discharged home and since being home has done much better in that regard. The CT angiogram did not show any pulmonary embolism. With regard to the osteomyelitis the patient was placed on vancomycin and Zosyn while in the hospital but has been changed to Augmentin at discharge. It was also recommended that he follow- up with wound care and podiatry. Podiatry however wanted him to see [name] according to the patient prior to them doing anything further. His hemoglobin A1c was 9.9 as noted in the hospital. Have an MRI of the left foot performed while in the hospital on 02/04/2019. 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 There is an overlying plantar open wound noted an abscess tracking back along the lateral aspect of the fifth metatarsal shaft. There is otherwise diffuse cellulitis and mild fasciitis without findings of polymyositis. The patient did have recently pneumonia secondary to COVID-19 I looked in the chart through epic and it does appear that the patient may need to have an additional x-ray just to ensure everything is cleared and that he has no airspace disease prior to putting him into the [name]. 03/05/2019; patient was readmitted to the clinic last week. He was hospitalized twice for a viral upper respiratory tract infection from 11/1 through 11/4 and then 11/5 through 11/12 ultimately this turned out to be Covid pneumonitis. Although he was discharged on oxygen he is not using it. He says he feels fine. He has no exercise limitation no cough no sputum. His O2 sat in our clinic today was 100% on room air. 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 He received Vanco and Zosyn in the hospital and then was discharged on 2 weeks of Augmentin. I do not see any relevant cultures. He was supposed to follow-up with infectious disease but I do not see that he has an appointment. 12/8; patient saw Dr. Novella Olive of infectious disease last week. He felt that he had had adequate antibiotic therapy. He did not go to follow-up with Dr. Amalia Hailey of podiatry and I have again talked to him about the pros and cons of this. He does not want to consider a ray amputation of this time. He is aware of  the risks of recurrence, migration etc. He started HBO today and tolerated this well. He can complete the Augmentin that I gave him last week. I have looked over the lab work that Dr. Chana Bode ordered his C-reactive protein was 3.3 and his sedimentation  rate was 17. The C-reactive protein is never really been measurably that high in this patient 12/15; not much change in the wound today however he has undermining along the lateral part of the foot again more extensively than last week. He has some rims of epithelialization. We have been using silver alginate. He is undergoing hyperbarics but did not dive today 12/18; in for his obligatory first total contact cast change. Unfortunately there was pus coming from the undermining area around his fifth metatarsal head. This was cultured but will preclude reapplication of a cast. He is seen in conjunction with HBO 12/24; patient had staph lugdunensis in the wound in the undermining area laterally last time. We put him on doxycycline which should have covered this. The wound looks better today. I am going to give him another week of doxycycline before reattempting the total contact cast 12/31; the patient is completing antibiotics. Hemorrhagic debris in the distal part of the wound with some undermining distally. He also had hyper granulation. Extensive debridement with a #5 curette. The infected area that was on the lateral part of the fifth met head is closed over. I do not think he needs any more antibiotics. Patient was seen prior to HBO. Preparations for a total contact cast were made in the cast will be placed post hyperbarics 04/11/19; once again the patient arrives today without complaint. He had been in a cast all week noted that he had heavy drainage this week. This resulted in large raised areas of macerated tissue around the wound 1/14; wound bed looks better slightly smaller. Hydrofera Blue has been changing himself. He had a heavy drainage last week which caused a lot of maceration around the wound so I took him out of a total contact cast he says the drainage is actually better this week He is seen today in conjunction with HBO 1/21; returns to clinic. He was up in [place] for a day or 2  attending a funeral. He comes back in with the wound larger and with a large area of exposed bone. He had osteomyelitis and septic arthritis of the fifth left metatarsal head while he was in hospital. He received IV antibiotics in the hospital for a prolonged period of time then 3 weeks of Augmentin. Subsequently I gave him 2 weeks of doxycycline for more superficial wound infection. When I saw this last week the wound was smaller the surface of the wound looks satisfactory. 1/28; patient missed hyperbarics today. Bone biopsy I did last time showed Enterococcus faecalis and Staphylococcus lugdunensis . He has a wide area of exposed bone. We are going to use silver alginate as of today. I had another ethical discussion with the patient. 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 In this situation I think the likelihood of healing this is low. Therefore I have recommended a ray amputation and with the patient's agreement I have referred him to Dr. Doran Durand. The other issue is that his compliance with hyperbarics has been minimal because of his work schedule and given his underlying decision I am going to stop this today READMISSION 10/24/2019 MRI 09/29/2019 left foot IMPRESSION: 1. Apparent skin ulceration inferior and lateral to the 5th metatarsal base with underlying  heterogeneous T2 signal and enhancement in the subcutaneous fat. Small peripherally enhancing fluid collections along the plantar and lateral aspects of the 5th metatarsal base suspicious for abscesses. 2. Interval amputation through the mid 5th metatarsal with nonspecific low-level marrow edema and enhancement. Given the proximity to the adjacent soft tissue inflammatory changes, osteomyelitis cannot be excluded. 3. The additional bones appear unremarkable. MRI 09/29/2019 right foot IMPRESSION: 1. Soft tissue ulceration lateral to the 5th MTP joint. There is low-level T2 hyperintensity within the  4th and 5th metatarsal heads and adjacent proximal phalanges without abnormal T1 signal or cortical destruction. These findings are nonspecific and could be seen with early marrow edema, hyperemia or early osteomyelitis. No evidence of septic joint. 2. Mild tenosynovitis and synovial enhancement associated with the extensor digitorum tendons at the level of the midfoot. 3. Diffuse low-level muscular T2 hyperintensity and enhancement, most consistent with diabetic myopathy. LEFT FOOT BONE Methicillin resistant staphylococcus aureus Staphylococcus lugdunensis MIC MIC CIPROFLOXACIN >=8 RESISTANT Resistant <=0.5 SENSI... Sensitive CLINDAMYCIN <=0.25 SENS... Sensitive >=8 RESISTANT Resistant ERYTHROMYCIN >=8 RESISTANT Resistant >=8 RESISTANT Resistant GENTAMICIN <=0.5 SENSI... Sensitive <=0.5 SENSI... Sensitive Inducible Clindamycin NEGATIVE Sensitive NEGATIVE Sensitive OXACILLIN >=4 RESISTANT Resistant 2 SENSITIVE Sensitive RIFAMPIN <=0.5 SENSI... Sensitive <=0.5 SENSI... Sensitive TETRACYCLINE <=1 SENSITIVE Sensitive <=1 SENSITIVE Sensitive TRIMETH/SULFA <=10 SENSIT Sensitive <=10 SENSIT Sensitive ... Marland Kitchen.. VANCOMYCIN 1 SENSITIVE Sensitive <=0.5 SENSI... Sensitive Right foot bone . Component 3 wk ago Specimen Description BONE Special Requests RIGHT 4 METATARSAL SAMPLE B Gram Stain NO WBC SEEN NO ORGANISMS SEEN Culture RARE METHICILLIN RESISTANT STAPHYLOCOCCUS AUREUS NO ANAEROBES ISOLATED Performed at [facility], [place] [address], [place], [place] [address] Report Status 10/08/2019 FINAL Organism ID, Bacteria METHICILLIN RESISTANT STAPHYLOCOCCUS AUREUS Resulting Agency [facility] Susceptibility Methicillin resistant staphylococcus aureus MIC CIPROFLOXACIN >=8 RESISTANT Resistant CLINDAMYCIN <=0.25 SENS... Sensitive ERYTHROMYCIN >=8 RESISTANT Resistant GENTAMICIN <=0.5 SENSI... Sensitive Inducible Clindamycin NEGATIVE Sensitive OXACILLIN >=4 RESISTANT Resistant RIFAMPIN  <=0.5 SENSI... Sensitive TETRACYCLINE <=1 SENSITIVE Sensitive TRIMETH/SULFA <=10 SENSIT Sensitive ... VANCOMYCIN 1 SENSITIVE Sensitive This is a patient we had in clinic earlier this year with a wound over his left fifth metatarsal head. 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 Doran Durand on 05/16/2019. He seems to have developed problems with wounds on his bilateral feet in June 2021 although he may have had problems earlier than this. He was in an urgent care with a right foot ulcer on 09/26/2019 and given a course of doxycycline. This was apparently after having trouble getting into see orthopedics. He was seen by podiatry on 09/28/2019 noted to have bilateral lower extremity ulcers including the left lateral fifth metatarsal base and the right subfifth met head. It was noted that had purulent drainage at that time. He required hospitalization from 6/20 through 7/2. This was because of worsening right foot wounds. He underwent bilateral operative incision and drainage and bone biopsies bilaterally. Culture results are listed above. He has been referred back to clinic by Dr. Jacqualyn Posey of podiatry. He is also followed by Dr. Megan Salon who saw him yesterday. He was discharged from hospital on Zyvox Flagyl and Levaquin and yesterday changed to doxycycline Flagyl and Levaquin. His inflammatory markers on 6/26 showed a sedimentation rate of 129 and a C-reactive protein of  5. This is improved to 14 and 1.3 respectively. This would indicate improvement. ABIs in our clinic today were 1.23 on the right and 1.20 on the left 11/01/2019 on evaluation today patient appears to be doing fairly well in regard to the wounds on his feet at this  point. Fortunately there is no signs of active infection at this time. No fevers, chills, nausea, vomiting, or diarrhea. He currently is seeing infectious disease and still under their care at this point. Subsequently he also has both wounds which she has not been using collagen on as he did not receive that in his packaging he did not call us and let us know that. Apparently that just was missed on the order. Nonetheless we will get that straightened out today. 8/9-Patient returns for bilateral foot wounds, using Prisma with hydrogel moistened dressings, and the wounds appear stable. Patient using surgical shoes, avoiding much pressure or weightbearing as much as possible 8/16; patient has bilateral foot wounds. 1 on the right lateral foot proximally the other is on the left mid lateral foot. Both required debridement of callus and thick skin around the wounds. We have been using silver collagen 8/27; patient has bilateral lateral foot wounds. The area on the left substantially surrounded by callus and dry skin. This was removed from the wound edge. The underlying wound is small. The area on the right measured somewhat smaller today. 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 Unfortunately I did not update his antibiotics during today's visit. 9/10 I reviewed Dr. Hale Bogus last notes he felt he had completed antibiotics his inflammatory markers were reasonably well controlled. He has a small wound on the lateral left foot and a tiny area on the right which is just above closed. He is using Hydrofera Blue with border foam he has bilateral surgical shoes 9/24; 2 week f/u. doing well. right foot is closed. left foot still undermined. 10/14; right foot remains closed at the fifth met head. The area over the base of the left fifth metatarsal has a small open area but considerable undermining towards the plantar foot. Thick callus skin  around this suggests an adequate pressure relief. We have talked about this. He says he is going to go back into his cam boot. I suggested a total contact cast he did not seem enamored with this suggestion 10/26; left foot base of the fifth metatarsal. Same condition as last time. He has skin over the area with an open wound however the skin is not adherent. He went to see Dr. Earleen Newport who did an x-ray and culture of his foot I have not reviewed the x-ray but the patient was not told anything. He is on doxycycline 11/11; since the patient was last here he was in the emergency room on 10/30 he was concerned about swelling in the left foot. They did not do any cultures or x-rays. They changed his antibiotics to cephalexin. Previous culture showed group B strep. The cephalexin is appropriate as doxycycline has less than predictable coverage. Arrives in clinic today with swelling over this area under the wound. He also has a new wound on the right fifth metatarsal head 11/18; the patient has a difficult wound on the lateral aspect of the left fifth metatarsal head. The wound was almost ballotable last week I opened it slightly expecting to see purulence however there was just bleeding. I cultured this this was negative. X-ray unchanged. We are trying to  get an MRI but I am not sure were going to be able to get this through his insurance. He also has an area on the right lateral fifth metatarsal head this looks healthier 12/3; the patient finally got our MRI. Surprisingly this did not show osteomyelitis. I did show the soft tissue ulceration at the lateral plantar aspect of the fifth metatarsal base with a tiny residual 6 mm abscess overlying the superficial fascia I have tried to culture this area I have not been able to get this to grow anything. Nevertheless the protruding tissue looks aggravated. I suspect we should try to treat the underlying "abscess with broad-spectrum antibiotics. I am going to start  him on Levaquin and Flagyl. He has much less edema in his legs and I am going to continue to wrap his legs and see him weekly 12/10. I started Levaquin and Flagyl on him last week. He just picked up the Flagyl apparently there was some delay. The worry is the wound on the left fifth metatarsal base which is substantial and worsening. His foot looks like he inverts at the ankle making this a weightbearing surface. Certainly no improvement in fact I think the measurements of this are somewhat worse. We have been using 12/17; he apparently just got the Levaquin yesterday this is 2 weeks after the fact. He has completed the Flagyl. The area over the left fifth metatarsal base still has protruding granulation tissue although it does not look quite as bad as it did some weeks ago. He has severe bilateral lymphedema although we have not been treating him for wounds on his legs this is definitely going to require compression. There was so much edema in the left I did not wish to put him in a total contact cast today. I am going to increase his compression from 3-4 layer. The area on the right lateral fifth met head actually look quite good and superficial. 12/23; patient arrived with callus on the right fifth met head and the substantial hyper granulated callused wound on the base of his fifth metatarsal. He says he is completing his Levaquin in 2 days but I do not think that adds up with what I gave him but I will have to double check this. We are using Hydrofera Blue on both areas. My plan is to put the left leg in a cast the week after New Year's 04/06/2020; patient's wounds about the same. Right lateral fifth metatarsal head and left lateral foot over the base of the fifth metatarsal. There is undermining on the left lateral foot which I removed before application of total contact cast continuing with Hydrofera Blue new. Patient tells me he was seen by endocrinology today lab work was done [Dr. Kerr]. Also  wondering whether he was referred to cardiology. I went over some lab work from previously does not have chronic renal failure certainly not nephrotic range proteinuria he does have very poorly controlled diabetes but this is not his most updated lab work. Hemoglobin A1c has been over 11 1/10; the patient had a considerable amount of leakage towards mid part of his left foot with macerated skin however the wound surface looks better the area on the right lateral fifth met head is better as well. I am going to change the dressing on the left foot under the total contact cast to silver alginate, continue with Hydrofera Blue on the right. 1/20; patient was in the total contact cast for 10 days. Considerable amount of drainage although  the skin around the wound does not look too bad on the left foot. The area on the right fifth metatarsal head is closed. Our nursing staff reports large amount of drainage out of the left lateral foot wound 1/25; continues with copious amounts of drainage described by our intake staff. PCR culture I did last week showed E. coli and Enterococcus faecalis and low quantities. Multiple resistance genes documented including extended spectrum beta lactamase, MRSA, MRSE, quinolone, tetracycline. The wound is not quite as good this week as it was 5 days ago but about the same size 2/3; continues with copious amounts of malodorous drainage per our intake nurse. The PCR culture I did 2 weeks ago showed E. coli and low quantities of Enterococcus. There were multiple resistance genes detected. I put Neosporin on him last week although this does not seem to have helped. The wound is slightly deeper today. Offloading continues to be an issue here although with the amount of drainage she has a total contact cast is just not going to work 2/10; moderate amount of drainage. Patient reports he cannot get his stocking on over the dressing. I told him we have to do that the nurse gave  him suggestions on how to make this work. The wound is on the bottom and lateral part of his left foot. Is cultured predominantly grew low amounts of Enterococcus, E. coli and anaerobes. There were multiple resistance genes detected including extended spectrum beta lactamase, quinolone, tetracycline. I could not think of an easy oral combination to address this so for now I am going to do topical antibiotics provided by [facility] I think the main agents here are vancomycin and an aminoglycoside. We have to be able to give him access to the wounds to get the topical antibiotic on 2/17; moderate amount of drainage this is unchanged. He has his Keystone topical antibiotic against the deep tissue culture organisms. He has been using this and changing the dressing daily. Silver alginate on the wound surface. 2/24; using Keystone antibiotic with silver alginate on the top. He had too much drainage for a total contact cast at one point although I think that is improving and I think in the next week or 2 it might be possible to replace a total contact cast I did not do this today. In general the wound surface looks healthy however he continues to have thick rims of skin and subcutaneous tissue around the wide area of the circumference which I debrided 06/04/2020 upon evaluation today patient appears to be doing well in regard to his wound. I do feel like he is showing signs of improvement. There is little bit of callus and dead tissue around the edges of the wound as well as what appears to be a little bit of a sinus tract that is off to the side laterally I would perform debridement to clear that away today. 3/17; left lateral foot. The wound looks about the same as I remember. Not much depth surface looks healthy. No evidence of infection 3/25; left lateral foot. Wound surface looks about the same. Separating epithelium from the circumference. There really is no evidence of infection here however not making  progress by my view 3/29; left lateral foot. Surface of the wound again looks reasonably healthy still thick skin and subcutaneous tissue around the wound margins. There is no evidence of infection. One of the concerns being brought up by the nurses has again the amount of drainage vis--vis continued use of a total  contact cast 4/5; left lateral foot at roughly the base of the fifth metatarsal. Nice healthy looking granulated tissue with rims of epithelialization. The overall wound measurements are not any better but the tissue looks healthy. The only concern is the amount of drainage although he has no surrounding maceration with what we have been doing recently to absorb fluid and protect his skin. He also has lymphedema. 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 He states that the kerlix/coban under the TCC rolled down and could not get it back up. He has been using an offloading scooter and has somehow hurt his right foot using this device. This happened last week. He states that the side of his right foot developed a blister and opened. The top of his foot also has a few small open wounds he thinks is due to his socks rubbing in his shoes. He has not been using any dressings to the wound. He denies purulent drainage, fever/chills or erythema to the wounds. 4/22; patient presents for 1 week follow-up. He developed new wounds to the right foot that were evaluated at last clinic visit. He continues to have a total contact cast to the left leg and he reports no issues. He has been using silver collagen to the right foot wounds with no issues. He denies purulent drainage, fever/chills or erythema to the right foot wounds. He has no complaints today 4/25;  patient presents for 1 week follow-up. He has a total contact cast of the left leg and reports no issues. He has been using silver alginate to the right foot wound. He denies purulent drainage, fever/chills or erythema to the right foot wounds. 5/2 patient presents for 1 week follow-up. T contact cast on the left. The wound which is on the base of the plantar foot at the base of the fifth metatarsal otal actually looks quite good and dimensions continue to gradually contract. HOWEVER the area on the right lateral fifth metatarsal head is much larger than what I remember from 2 weeks ago. Once more is he has significant levels of hypergranulation. Noteworthy that he had this same hyper granulated response on his wound on the left foot at one point in time. So much so that he I thought there was an underlying fluid collection. Based on this I think this just needs debridement. 5/9; the wound on the left actually continues to be gradually smaller with a healthy surface. Slight amount of drainage and maceration of the skin around but not too bad. However he has a large wound over the right fifth metatarsal head very much in the same configuration as his left foot wound was initially. I used silver nitrate to address the hyper granulated tissue no mechanical debridement 5/16; area on the left foot did not look as healthy this week deeper thick surrounding macerated skin and subcutaneous tissue. The area on the right foot fifth met head was about the same The area on the right ankle that we identified last week is completely broken down into an open wound presumably a stocking rubbing issue 5/23; patient has been using a total contact cast to the left side. He has been using silver alginate underneath. He has also been using silver alginate to  the right foot wounds. He has no complaints today. He denies any signs of infection. 5/31; the left-sided wound looks some better measure smaller surface  granulation looks better. We have been using silver alginate under the total contact cast The large area on his right fifth met head and right dorsal foot look about the same still using silver alginate 6/6; neither side is good as I was hoping although the surface area dimensions are better. A lot of maceration on his left and right foot around the wound edge. Area on the dorsal right foot looks better. He says he was traveling. I am not sure what does the amount of maceration around the plantar wounds may be drainage issues 6/13; in general the wound surfaces look quite good on both sides. Macerated skin and raised edges around the wound required debridement although in general especially on the left the surface area seems improved. The area on the right dorsal ankle is about the same I thought this would not be such a problem to close 6/20; not much change in either wound although the one on the right looks a little better. Both wounds have thick macerated edges to the skin requiring debridements. We have been using silver alginate. The area on the dorsal right ankle is still open I thought this would be closed. 6/28; patient comes in today with a marked deterioration in the right foot wound fifth met head. Wide area of exposed bone this is a drastic change from last time. The area on the left there we have been casting is stagnant. We have been using silver alginate in both wound areas. 7/5; bone culture I did for PCR last time was positive for Pseudomonas, group B strep, Enterococcus and Staph aureus. There was no suggestion of methicillin resistance or ampicillin resistant genes. This was resistant to tetracycline however He comes into the clinic today with the area over his right plantar fifth metatarsal head which had been doing so well 2 weeks ago completely necrotic feeling bone. I do not know that this is going to be salvageable. The left foot wound is certainly no smaller but it has a  better surface and is superficial. 7/8; patient called in this morning to say that his total contact cast was rubbing against his foot. He states he is doing fine overall. He denies signs of infection. 7/12; continued deterioration in the wound over the right fifth metatarsal head crumbling bone. This is not going to be salvageable. The patient agrees and wants to be referred to Dr. Doran Durand which we will attempt to arrange as soon as possible. I am going to continue him on antibiotics as long as that takes so I will renew those today. The area on the left foot which is the base of the fifth metatarsal continues to look somewhat better. Healthy looking tissue no depth no debridement is necessary here. 7/20; the patient was kindly seen by Dr. Doran Durand of orthopedics on 10/19/2020. 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 Towards this end we have taken him out of the total contact cast on the left we will put him in a wrap with Hydrofera Blue. As I understand things surgery is planned for 7/21 7/27; patient had his surgery last Thursday. 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 Apparently everything went well we did not still disturb that today The area on the  left foot actually looks quite good. He has been much less mobile which probably explains this he did not seem to do well in the total contact cast secondary to drainage and maceration I think. 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 Fortunately there is no evidence of active infection at this time. No fevers, chills, nausea, vomiting, or diarrhea. Overall I think that he is actually doing extremely well. Nonetheless I do believe that he is staying off of this more following the surgery in his right foot that is the reason the left is doing so  great. 8/16; left plantar foot wound. This looks smaller than the last time I saw this he is using Hydrofera Blue. The surgical wound on the right foot is being followed by Dr. Doran Durand we did not look at this today. He has surgical shoes on both feet 8/23; left plantar foot wound not as good this week. Surrounding macerated skin and subcutaneous tissue everything looks moist and wet. I do not think he is offloading this adequately. He is using a surgical shoe Apparently the right foot surgical wound is not open although I did not check his foot 8/31; left plantar foot lateral aspect. Much improved this week. He has no maceration. Some improvement in the surface area of the wound but most impressively the depth is come in we are using silver alginate. The patient is a Product/process development scientist. He is asked that we write him a letter so he can go back to work. I have also tried to see if we can write something that will allow him to limit the amount of time that he is on his foot at work. 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 Hopefully the latter can be avoided 9/6; I believe the patient missed an appointment last week. He arrives in today with a wound looking roughly the same certainly no better. Undermining laterally and also inferiorly. We used molecuLight today in training with the patient's permission.. We are using silver alginate 9/21 wound is measuring bigger this week although this may have to do with the aggressive circumferential debridement last week in response to the blush fluorescence on the MolecuLight. Culture I did last week showed significant MSSA and E. coli. I put him on Augmentin but he has not started it yet. We are also going to send this for compounded antibiotics at [facility]. There is no evidence of systemic infection 9/29; silver alginate. His Keystone arrived. He is completing Augmentin in 2  days. Offloading in a cam boot. Moderate drainage per our intake staff 10/5; using silver alginate. He has been using his [place]. He has completed his Augmentin. Per our intake nurse still a lot of drainage, far too much to consider a total contact cast. Wound measures about the same. He had the same undermining area that I defined last week from a roughly 11-3. I remove this today 10/12; using silver alginate he is using the [place]. He comes in for a nurse visit hence we are applying [employer] twice a week. Measuring slightly better today and less notable drainage. Extensive debridement of the wound edge last time 10/18; using topical Keystone and silver alginate and a soft cast. Wound measurements about the same. Drainage was through his soft cast. We are changing this twice  a week Tuesdays and Friday 10/25; comes in with moderate drainage. Still using Keystone silver alginate and a soft cast. Wound dimensions completely the same.He has a lot of edema in the left leg he has lymphedema. Asking for [name] to consider wrapping him as he cannot get his stocking on over the soft cast 11/2; comes in with moderate to large drainage slightly smaller in terms of width we have been using [place]. His wound looks satisfactory but not much improvement 11/4; patient presents today for obligatory cast change. Has no issues or complaints today. He denies signs of infection. 11/9; patient traveled this weekend to [place], was on the cast quite a bit. Staining of the cast with black material from his walking boot. Drainage was not quite as bad as we feared. Using silver alginate and Keystone 11/16; we do not have size for cast therefore we have been putting a soft cast on him since the change on Friday. Still a significant amount of drainage necessitating changing twice a week. We have been using the Keystone at cast changes either hard or soft as well as silver alginate Comes in the clinic with things actually looking  fairly good improvement in width. He says his offloading is about the same 02/24/2021 upon evaluation today patient actually comes back in and is doing excellent in regard to his foot ulcer this is significantly smaller even compared to the last visit. The soft cast seems to have done extremely well for him which is great news. I do not see any signs of infection minimal debridement will be needed today. 11/30; left lateral foot much improved half a centimeter improvement in surface area. No evidence of infection. He seems to be doing better with the soft cast in the TCC therefore we will continue with this. He comes back in later in the week for a change with the nurses. This is due to drainage 12/6; no improvement in dimensions. Under illumination some debris on the surface we have been using silver alginate, soft cast. If there is anything optimistic here he seems to have have less drainage 12/13. Dimensions are improved both length and width and slightly in depth. Appears to be quite healthy today. Raised edges of this thick skin and callus around the edges however. He is in a soft cast were bringing him back once for a change on Friday. Drainage is better 12/20. Dimensions are improved. He still has raised edges of thick skin and callus around the edges. We are using a soft cast 12/28; comes in today with thick callus around the wound. Using silver under alginate under a soft cast. I do not think there is much improvement in any measurement 2023 04/06/2021; patient was put in a total contact cast. Unfortunately not much change in surface area 1/10; not much different still thick callus and skin around the edge in spite of the total contact cast. This was just debrided last week we have been using the [facility] compounded antibiotic and silver alginate under a total contact cast 1/18 the patient's wound on the left side is doing nicely. smaller HOWEVER he comes in today with a wound on the right  foot laterally. blister most likely serosangquenous drainage 1/24; the patient continues to do well in terms of the plantar left foot which is continued to contract using silver alginate under the total contact cast HOWEVER the right lateral foot is bigger with denuded skin around the edges. I used pickups and a #15 scalpel to remove this  this looks like the remanence of a large blister. Cannot rule out infection. Culture in this area I did last week showed Staphylococcus lugdunensis few colonies. I am going to try to address this with his [employer] antibiotic that is done so well on the left having linezolid and this should cover the staph 2/1; the patient's wound on his left foot which was the original plantar foot wound thick skin and eschar around the edges even in the total contact cast but the wound surface does not look too bad The real problem is on how his right lateral foot at roughly the base of the fifth metatarsal. The wound is completely necrotic more worrisome than that there is swelling around the edges of this. We have been using silver alginate on both wounds and Keystone on the right foot. Unfortunately I think he is going to require systemic antibiotics while we await cultures. He did not get the x-ray done that we ordered last week [lost the prescription 2/7; disappointingly in the area on the left foot which we are treating with a total contact cast is still not closed although it is much smaller. He continues to have a lot of callus around the wound edge. -Right lateral foot culture I did last week was negative x-ray also negative for osteomyelitis. 2/15: TCC silver alginate on the left and silver alginate on the right lateral. No real improvement in either area 05/26/2021: T oday, the wounds are roughly the same size as at his previous visit, post-debridement. He continues to endorse fairly substantial drainage, particularly on the right. He has been in a total contact cast on  the left. There is still some callus surrounding this lesion. On the right, the periwound skin is quite macerated, along with surrounding callus. The center of the right-sided wound also has some dark, densely adherent material, which is very difficult to remove. 06/02/2021: Today, both wounds are slightly smaller. He has been using zinc oxide ointment around the right ulcer and the degree of maceration has improved markedly. There continues to be an area of nonviable tissue in the center of the right sided ulcer. The left-sided wound, which has been in the total contact cast. Appears clean and the degree of callus around it is less than previously. 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 He had to take the stairs and both wounds have increased in size. The left foot, which has been in a total contact cast, has developed a tunnel tracking to the lateral aspect of his foot. The nonviable tissue in the center of the right-sided ulcer remains recalcitrant to debridement. There is significant undermining surrounding the entirety of the left sided wound. 06/16/2021: The elevator at school has been fixed and the patient has been able to avoid putting as much weight on his wounds over the past week. We converted the left foot wound into a single lesion today, but despite this, the wound is actually smaller. The base is healthy with limited periwound callus. On the right, the central necrotic area is still present. He continues to be quite macerated around the right sided wound, despite applying barrier cream. This does, however, have the benefit of softening the callus to make it more easily removable. 06/23/2021: Today, the left wound is smaller. The lateral aspect that had opened up previously is now closed. The wound base has a healthy bed of granulation tissue and minimal slough. Unfortunately, on the right, the  wound is larger and continues  to be fairly macerated. He has also reopened the wound at his right ankle. He thinks this is due to the gait he has adopted secondary to his total contact cast and boot. 06/30/2021: T oday, both wounds are a little bit larger. The lateral aspect on the left has remained closed. He continues to have significant periwound maceration. The culture that I took from the right sided wound grew a population of bacteria that is not covered by his current [facility] antibiotic. The center of the right- sided wound continues to appear necrotic with nonviable fat. It probes deeper today, but does not reach bone. 07/07/2021: The periwound maceration is a little bit less today. The right lateral foot wound has some areas that appear more viable and the necrotic center also looks a little bit better. The wound on the dorsal surface of his right foot near the ankle is contracting and the surface appears healthy. The left plantar wound surface looks healthy, but there is some new undermining on the medial portion. He did get his new Keystone antibiotic and began applying that to the right foot wound on Saturday. 07/14/2021: The intake nurse reported substantial drainage from his wounds, but the periwound skin actually looks better than is typical for him. The wound on the dorsal surface of his right foot near the ankle is smaller and just has a small open area underneath some dried eschar. The left plantar wound surface looks healthy and there has been no significant accumulation of callus. The right lateral foot wound looks quite a bit better, with the central portion, which typically appears necrotic, looking more viable albeit pale. 07/22/2021: His left foot is extremely macerated today. The wound is about the same size. The wound on the dorsal surface of his right foot near the ankle had closed, but he traumatized it removing the dressing and there is a tiny skin tear in that location. The right lateral foot wound is  bigger, but the surface appears healthy. 07/30/2021: The wound on the dorsal surface of his right foot near the ankle is closed. The right lateral foot wound again is a little bit bigger due to some undermining. The periwound skin is in better condition, however. He has been applying zinc oxide. The wound surface is a little bit dry today. On the left, he does not have the substantial maceration that we frequently see. The wound itself is smaller and has a clean surface. 08/06/2021: Both wounds seem to have deteriorated over the past week. The right lateral foot wound has a dry surface but the periwound is boggy.. Overall wound dimensions are about the same. On the left, the wound is about the same size, but there is more undermining present underneath periwound callus. 08/13/2021: The right sided wound looks about the same, but on the left there has been substantial deterioration. The undermining continues to extend under periwound callus. Once this was removed, substantial extension of the wound was present. There is no odor or purulent drainage but clearly the wounds have broken down. 08/20/2021: The wounds look about the same today. He has been out of his total contact cast and has just been changing the dressings using topical Keystone with PolyMem Ag, Kerlix and Ace bandages. The wound on the top of his right ankle has reopened but this is quite small. There was a little bit of purulent material that I expressed when examining this wound. 08/24/2021: After the aggressive debridement I performed at his  last visit, the wounds actually look a little bit better today. They are smaller with the exception of the wound on the top of his right ankle which is a little bit bigger as some more skin pulled off when he was changing his dressing. We are using topical Keystone with PolyMem Ag Kerlix and Ace bandages. 09/02/2021: There has been really no change to any of his wounds. 09/16/2021: The patient was  hospitalized last week with nausea, vomiting, and dehydration. He says that while he was in the hospital, his wounds were not really addressed properly. T oday, both plantar foot wounds are larger and the periwound skin is macerated. The wound on the dorsum of his right foot has a scab on the top. The right foot now has a crater where previously he had had nonviable fat. It looks as though this simply died and fell out. The periwound callus is wet. 09/24/2021: His wounds have deteriorated somewhat since his last visit. The wound on the dorsum of his right foot near his ankle is larger and has more nonviable tissue present. The crater in his right foot is even deeper; I cannot quite palpate or probe to bone but I am sure it is close. The wound on his left plantar foot has an odd boggy area in the center that almost feels as though it has fluid within it. He has run out of his topical Keystone antibiotic. We are using silver alginate on his wounds. 09/29/2021: He has developed a new wound on the dorsum of his left foot near his ankle. He says he thinks his wrapping is rubbing in that site. I would concur with this as the wound on his right ankle is larger. The left foot looks about the same. The right foot has the crater that was present last week. No significant slough accumulation, but his foot remains quite swollen and warm despite oral antibiotic therapy. 10/08/2021: All of his wounds look about the same as last week. He did not start his oral antibiotics that are prescribed until just a couple of days ago; his [employer] compounded antibiotics formula has been changed and he is awaiting delivery of the new recipe. His MRI that was scheduled for earlier this week was canceled as no prior authorization had been obtained; unfortunately the tech responsible sent an email to my old [place] email, which I no longer use nor have access to. 10/18/2021: The wounds on his bilateral dorsal feet near the ankles  are both improved. They are smaller and have just some eschar and slough buildup. The left plantar wound has a fair amount of undermining, but the surface is clean. There is some periwound callus accumulation. On the right plantar foot, there is nonviable fat leading to a deep tunnel that tracks towards his dorsal medial foot. There is periwound callus and slough accumulation, as well. His right foot and leg remain swollen as compared to the left. 10/25/2021: The wounds on his bilateral dorsal feet and at the ankles have broken down somewhat. They are little bit larger than last week. The left plantar wound continues to undermine laterally but the surface is clean. The right plantar foot wound shows some decreased depth in the tunnel tracking towards his dorsal medial foot. He has not yet had the Doppler study that I ordered; it sounds like there is some confusion about the scheduling of the procedure. In addition, the MRI was denied and I have taken steps to appeal the denial. 11/24/2021: Since our  last visit, Mr. Wickens was admitted to the hospital where an MRI suggested osteomyelitis. He was taken the operating room by podiatry. Bone biopsies were negative for osteomyelitis. They debrided his wounds and applied myriad matrix. He saw them last week and they removed his staples. He is here today to continue his wound healing process. T oday, both of the dorsal foot/ankle wounds are substantially smaller. There is just a little eschar overlying the left sided wound and some eschar and slough on the right. The right plantar foot ulcer has the healthiest surface of granulation tissue that I have seen to date. A portion of the myriad matrix failed to take and was hanging loose. It appears that myriad morcells were placed into the tunnel closest to the dorsal portion of his foot. These have sloughed off. The left plantar foot ulcer is about the same size, but has a much healthier surface than in the  past. Both plantar ulcers have callus and slough accumulation. 12/07/2021: Left dorsal foot/ankle wound is closed. The right dorsal foot/ankle wound is nearly closed and just has a small open area with some eschar and slough. The right plantar foot wound has contracted quite a bit since our last visit. It has a healthy surface with just a little bit of slough accumulation and periwound callus buildup. The left plantar foot wound is about the same size but the surface appears healthy. There is a little slough and periwound callus on this side, as well. 12/15/2021: Both dorsal foot/ankle wounds are closed. The right plantar foot wound is substantially smaller than at our last visit. The tunneling that was present has nearly closed. There is just a little bit of slough buildup. The left plantar foot wound is also a little bit smaller today. The surface is the healthiest that I have ever seen it. Light slough and periwound callus accumulation on this side. Electronic Signature(s) Signed: 12/15/2021 9:20:28 AM By: [name] MD FACS Entered By: [name] on 12/15/2021 09:20:27 -------------------------------------------------------------------------------- Physical Exam Details Patient Name: Date of Service: [name], [name] 12/15/2021 8:15 A M Medical Record Number: [id number] Patient Account Number: [id number] Date of Birth/Sex: Treating RN: 15-Apr-1985 (36 y.o. M) Primary Care Provider: [name] Other Clinician: Referring Provider: Treating Provider/Extender: [name] in Treatment: 111 Constitutional Slightly hypertensive. . . . No acute distress.Marland Kitchen Respiratory Normal work of breathing on room air.. Notes 12/15/2021: Both dorsal foot/ankle wounds are closed. The right plantar foot wound is substantially smaller than at our last visit. The tunneling that was present has nearly closed. There is just a little bit of slough buildup. The left plantar  foot wound is also a little bit smaller today. The surface is the healthiest that I have ever seen it. Light slough and periwound callus accumulation on this side. Electronic Signature(s) Signed: 12/15/2021 9:20:57 AM By: [name] MD FACS Entered By: [name] on 12/15/2021 09:20:57 -------------------------------------------------------------------------------- Physician Orders Details Patient Name: Date of Service: [name], [name] 12/15/2021 8:15 A M Medical Record Number: [id number] Patient Account Number: [id number] Date of Birth/Sex: Treating RN: Nov 03, 1985 (36 y.o. [name] Primary Care Provider: [name] Other Clinician: Referring Provider: Treating Provider/Extender: [name] in Treatment: [phone] Verbal / Phone Orders: No Diagnosis Coding ICD-10 Coding Code Description E11.621 Type 2 diabetes mellitus with foot ulcer L97.528 Non-pressure chronic ulcer of other part of left foot with other specified severity L97.518 Non-pressure chronic ulcer of other part of right foot  with other specified severity L97.322 Non-pressure chronic ulcer of left ankle with fat layer exposed Follow-up Appointments ppointment in 1 week. - Dr. [name] - Room 1 with [name] Return A Wednesday 12/22/21 at 08:15 AM ppointment in 2 weeks. - Dr. [name] - Room 1 with [name] Return A Wednesday 12/29/21 at 08:15 AM Bathing/ Shower/ Hygiene May shower and wash wound with soap and water. Edema Control - Lymphedema / SCD / Other Bilateral Lower Extremities Elevate legs to the level of the heart or above for 30 minutes daily and/or when sitting, a frequency of: - throughout the day Avoid standing for long periods of time. Exercise regularly Compression stocking or Garment 20-30 mm/Hg pressure to: - to both legs daily Off-Loading Open toe surgical shoe to: - Both feet Additional Orders / Instructions Follow Nutritious Diet Wound Treatment Wound #3 -  Foot Wound Laterality: Plantar, Left, Lateral Cleanser: Soap and Water 1 x Per Day/30 Days Discharge Instructions: May shower and wash wound with dial antibacterial soap and water prior to dressing change. Peri-Wound Care: Sween Lotion (Moisturizing lotion) 1 x Per Day/30 Days Discharge Instructions: Apply moisturizing lotion as directed Topical: keystone antibiotic compound 1 x Per Day/30 Days Prim Dressing: Promogran Prisma Matrix, 4.34 (sq in) (silver collagen) (Dispense As Written) 1 x Per Day/30 Days ary Discharge Instructions: Moisten collagen with saline or hydrogel Secondary Dressing: ABD Pad, 8x10 (Dispense As Written) 1 x Per Day/30 Days Discharge Instructions: Apply over primary dressing as directed. Secondary Dressing: Optifoam Non-Adhesive Dressing, 4x4 in (Dispense As Written) 1 x Per Day/30 Days Discharge Instructions: Apply over primary dressing as directed. Secondary Dressing: NonWoven Sponge, 4x4 (in/in) (Generic) 1 x Per Day/30 Days Secured With: 58M Medipore H Soft Cloth Surgical T ape, 4 x 10 (in/yd) (Generic) 1 x Per Day/30 Days Discharge Instructions: Secure with tape as directed. Compression Wrap: Kerlix Roll 4.5x3.1 (in/yd) 1 x Per Day/30 Days Discharge Instructions: Apply Kerlix and Coban compression as directed. Compression Wrap: 58M ACE Elastic Bandage With VELCRO Brand Closure, 4 (in) (Generic) 1 x Per Day/30 Days Wound #8 - Foot Wound Laterality: Right, Lateral Cleanser: Soap and Water 1 x Per Day/30 Days Discharge Instructions: May shower and wash wound with dial antibacterial soap and water prior to dressing change. Peri-Wound Care: Sween Lotion (Moisturizing lotion) 1 x Per Day/30 Days Discharge Instructions: Apply moisturizing lotion as directed Topical: keystone antibiotic compound 1 x Per Day/30 Days Prim Dressing: Promogran Prisma Matrix, 4.34 (sq in) (silver collagen) (Dispense As Written) 1 x Per Day/30 Days ary Discharge Instructions: Moisten collagen  with saline or hydrogel Secondary Dressing: ABD Pad, 8x10 (Dispense As Written) 1 x Per Day/30 Days Discharge Instructions: Apply over primary dressing as directed. Secondary Dressing: Optifoam Non-Adhesive Dressing, 4x4 in (Dispense As Written) 1 x Per Day/30 Days Discharge Instructions: Apply over primary dressing as directed. Secondary Dressing: NonWoven Sponge, 4x4 (in/in) (Generic) 1 x Per Day/30 Days Secured With: 58M Medipore H Soft Cloth Surgical T ape, 4 x 10 (in/yd) (Generic) 1 x Per Day/30 Days Discharge Instructions: Secure with tape as directed. Compression Wrap: Kerlix Roll 4.5x3.1 (in/yd) 1 x Per Day/30 Days Discharge Instructions: Apply Kerlix and Coban compression as directed. Compression Wrap: 58M ACE Elastic Bandage With VELCRO Brand Closure, 4 (in) (Generic) 1 x Per Day/30 Days Electronic Signature(s) Signed: 12/15/2021 10:10:05 AM By: [name] MD FACS Entered By: [name] on 12/15/2021 09:21:34 -------------------------------------------------------------------------------- Problem List Details Patient Name: Date of Service: [name], [name] 12/15/2021 8:15 A M Medical  Record Number: [id number] Patient Account Number: [id number] Date of Birth/Sex: Treating RN: Aug 31, 1985 (36 y.o. M) Primary Care Provider: [name] Other Clinician: Referring Provider: Treating Provider/Extender: [name] in Treatment: 111 Active Problems ICD-10 Encounter Code Description Active Date MDM Diagnosis E11.621 Type 2 diabetes mellitus with foot ulcer 10/24/2019 No Yes L97.528 Non-pressure chronic ulcer of other part of left foot with other specified 10/24/2019 No Yes severity L97.518 Non-pressure chronic ulcer of other part of right foot with other specified 10/24/2019 No Yes severity L97.322 Non-pressure chronic ulcer of left ankle with fat layer exposed 09/29/2021 No Yes Inactive Problems ICD-10 Code Description Active Date Inactive  Date L97.518 Non-pressure chronic ulcer of other part of right foot with other specified severity 07/14/2020 07/14/2020 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, right ankle and foot 10/24/2019 10/24/2019 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 08/10/2020 08/10/2020 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, left ankle and foot 10/24/2019 10/24/2019 B95.62 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 10/24/2019 10/24/2019 classified elsewhere Resolved Problems Electronic Signature(s) Signed: 12/15/2021 9:17:28 AM By: [name] MD FACS Entered By: [name] on 12/15/2021 09:17:28 -------------------------------------------------------------------------------- Progress Note Details Patient Name: Date of Service: [name], [name] 12/15/2021 8:15 A M Medical Record Number: [id number] Patient Account Number: [id number] Date of Birth/Sex: Treating RN: 29-Mar-1986 (36 y.o. 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 He comes in for review of a wound over the plantar fifth metatarsal head extending into the lateral part of the foot. He was followed for this previously by his podiatrist Dr. Cornelius Moras. As the patient tells his story he went to see podiatry first for a swelling he developed on the lateral part of his fifth metatarsal head in  May. He states this was "open" by podiatry and the area closed. He was followed up in June and it was again opened callus removed and it closed promptly. There were plans being made for surgery on the fifth metatarsal head in June however his blood sugar was apparently too high for anesthesia. Apparently the area was debrided and opened again in June and it is never closed since. Looking over the records from podiatry I am really not able to follow this. It was clear when he was first seen it was before 5/14 at that point he already had a wound. By 5/17 the ulcer was resolved. I do not see anything about a procedure. On 5/28 noted to have pre-ulcerative moderate keratosis. X-ray noted 1/5 contracted toe and tailor's bunion and metatarsal deformity. On a visit date on 09/28/2018 the dorsal part of the left foot it healed and resolved. There was concern about swelling in his lower extremity he was sent to the ER.. As far as I can tell he was seen in the ER on 7/12 with an ulcer on his left foot. A DVT rule out of the left leg was negative. I do not think I have complete records from podiatry but I am not able to verify  the procedures this patient states he had. He states after the last procedure the wound has never closed although I am not able to follow this in the records I have from podiatry. He has not had a recent x-ray The patient has been using Neosporin on the wound. He is wearing a Darco shoe. He is still very active up on his foot working and exercising. Past medical history; type 2 diabetes ketosis-prone, leg swelling with a negative DVT study in July. Non-smoker ABI in our clinic was 0.85 on the left 10/16; substantial wound on the plantar left fifth met head extending laterally almost to the dorsal fifth MTP. We have been using silver alginate we gave him a Darco forefoot off loader. An x-ray did not show evidence of osteomyelitis did note soft tissue emphysema which I think was due to gas  tracking through an open wound. There is no doubt in my mind he requires an MRI 10/23; MRI not booked until 3 November at the earliest this is largely due to his glucose sensor in the right arm. We have been using silver alginate. There has been an improvement 10/29; I am still not exactly sure when his MRI is booked for. He says it is the third but it is the 10th in epic. This definitely needs to be done. He is running a low-grade fever today but no other symptoms. No real improvement in the 1 02/26/2019 patient presents today for a follow-up visit here in our clinic he is last been seen in the clinic on October 29. Subsequently we were working on getting MRI to evaluate and see what exactly was going on and where we would need to go from the standpoint of whether or not he had osteomyelitis and again what treatments were going be required. Subsequently the patient ended up being admitted to the hospital on 02/07/2019 and was discharged on 02/14/2019. This is a somewhat interesting admission with a discharge diagnosis of pneumonia due to COVID-19 although he was positive for COVID-19 when tested at the urgent care but negative x2 when he was actually in the hospital. With that being said he did have acute respiratory failure with hypoxia and it was noted he also have a left foot ulceration with osteomyelitis. With that being said he did require oxygen for his pneumonia and I level 4 L. He was placed on antivirals and steroids for the COVID-19. He was also transferred to the [facility] at one point. Nonetheless he did subsequently discharged home and since being home has done much better in that regard. The CT angiogram did not show any pulmonary embolism. With regard to the osteomyelitis the patient was placed on vancomycin and Zosyn while in the hospital but has been changed to Augmentin at discharge. It was also recommended that he follow- up with wound care and podiatry. Podiatry however  wanted him to see [name] according to the patient prior to them doing anything further. His hemoglobin A1c was 9.9 as noted in the hospital. Have an MRI of the left foot performed while in the hospital on 02/04/2019. 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 There is an overlying plantar open wound noted an abscess tracking back along the lateral aspect of the fifth metatarsal shaft. There is otherwise diffuse cellulitis and mild fasciitis without findings of polymyositis. The patient did have recently pneumonia secondary to COVID-19 I looked in the chart through  epic and it does appear that the patient may need to have an additional x-ray just to ensure everything is cleared and that he has no airspace disease prior to putting him into the [name]. 03/05/2019; patient was readmitted to the clinic last week. He was hospitalized twice for a viral upper respiratory tract infection from 11/1 through 11/4 and then 11/5 through 11/12 ultimately this turned out to be Covid pneumonitis. Although he was discharged on oxygen he is not using it. He says he feels fine. He has no exercise limitation no cough no sputum. His O2 sat in our clinic today was 100% on room air. 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 He received Vanco and Zosyn in the hospital and then was discharged on 2 weeks of Augmentin. I do not see any relevant cultures. He was supposed to follow-up with infectious disease but I do not see that he has an appointment. 12/8; patient saw Dr. Novella Olive of infectious disease last week. He felt that he had had adequate antibiotic therapy. He did not go to follow-up with Dr. Amalia Hailey of podiatry and I have again talked to him about the pros and cons of this. He does not want to consider a ray amputation  of this time. He is aware of the risks of recurrence, migration etc. He started HBO today and tolerated this well. He can complete the Augmentin that I gave him last week. I have looked over the lab work that Dr. Chana Bode ordered his C-reactive protein was 3.3 and his sedimentation rate was 17. The C-reactive protein is never really been measurably that high in this patient 12/15; not much change in the wound today however he has undermining along the lateral part of the foot again more extensively than last week. He has some rims of epithelialization. We have been using silver alginate. He is undergoing hyperbarics but did not dive today 12/18; in for his obligatory first total contact cast change. Unfortunately there was pus coming from the undermining area around his fifth metatarsal head. This was cultured but will preclude reapplication of a cast. He is seen in conjunction with HBO 12/24; patient had staph lugdunensis in the wound in the undermining area laterally last time. We put him on doxycycline which should have covered this. The wound looks better today. I am going to give him another week of doxycycline before reattempting the total contact cast 12/31; the patient is completing antibiotics. Hemorrhagic debris in the distal part of the wound with some undermining distally. He also had hyper granulation. Extensive debridement with a #5 curette. The infected area that was on the lateral part of the fifth met head is closed over. I do not think he needs any more antibiotics. Patient was seen prior to HBO. Preparations for a total contact cast were made in the cast will be placed post hyperbarics 04/11/19; once again the patient arrives today without complaint. He had been in a cast all week noted that he had heavy drainage this week. This resulted in large raised areas of macerated tissue around the wound 1/14; wound bed looks better slightly smaller. Hydrofera Blue has been changing himself. He  had a heavy drainage last week which caused a lot of maceration around the wound so I took him out of a total contact cast he says the drainage is actually better this week He is seen today in  conjunction with HBO 1/21; returns to clinic. He was up in [place] for a day or 2 attending a funeral. He comes back in with the wound larger and with a large area of exposed bone. He had osteomyelitis and septic arthritis of the fifth left metatarsal head while he was in hospital. He received IV antibiotics in the hospital for a prolonged period of time then 3 weeks of Augmentin. Subsequently I gave him 2 weeks of doxycycline for more superficial wound infection. When I saw this last week the wound was smaller the surface of the wound looks satisfactory. 1/28; patient missed hyperbarics today. Bone biopsy I did last time showed Enterococcus faecalis and Staphylococcus lugdunensis . He has a wide area of exposed bone. We are going to use silver alginate as of today. I had another ethical discussion with the patient. 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 In this situation I think the likelihood of healing this is low. Therefore I have recommended a ray amputation and with the patient's agreement I have referred him to Dr. Doran Durand. The other issue is that his compliance with hyperbarics has been minimal because of his work schedule and given his underlying decision I am going to stop this today READMISSION 10/24/2019 MRI 09/29/2019 left foot IMPRESSION: 1. Apparent skin ulceration inferior and lateral to the 5th metatarsal base with underlying heterogeneous T2 signal and enhancement in the subcutaneous fat. Small peripherally enhancing fluid collections along the plantar and lateral aspects of the 5th metatarsal base suspicious for abscesses. 2. Interval amputation through the mid 5th metatarsal with nonspecific low-level marrow edema and enhancement. Given  the proximity to the adjacent soft tissue inflammatory changes, osteomyelitis cannot be excluded. 3. The additional bones appear unremarkable. MRI 09/29/2019 right foot IMPRESSION: 1. Soft tissue ulceration lateral to the 5th MTP joint. There is low-level T2 hyperintensity within the 4th and 5th metatarsal heads and adjacent proximal phalanges without abnormal T1 signal or cortical destruction. These findings are nonspecific and could be seen with early marrow edema, hyperemia or early osteomyelitis. No evidence of septic joint. 2. Mild tenosynovitis and synovial enhancement associated with the extensor digitorum tendons at the level of the midfoot. 3. Diffuse low-level muscular T2 hyperintensity and enhancement, most consistent with diabetic myopathy. LEFT FOOT BONE Methicillin resistant staphylococcus aureus Staphylococcus lugdunensis MIC MIC CIPROFLOXACIN >=8 RESISTANT Resistant <=0.5 SENSI... Sensitive CLINDAMYCIN <=0.25 SENS... Sensitive >=8 RESISTANT Resistant ERYTHROMYCIN >=8 RESISTANT Resistant >=8 RESISTANT Resistant GENTAMICIN <=0.5 SENSI... Sensitive <=0.5 SENSI... Sensitive Inducible Clindamycin NEGATIVE Sensitive NEGATIVE Sensitive OXACILLIN >=4 RESISTANT Resistant 2 SENSITIVE Sensitive RIFAMPIN <=0.5 SENSI... Sensitive <=0.5 SENSI... Sensitive TETRACYCLINE <=1 SENSITIVE Sensitive <=1 SENSITIVE Sensitive TRIMETH/SULFA <=10 SENSIT Sensitive <=10 SENSIT Sensitive ... Marland Kitchen.. VANCOMYCIN 1 SENSITIVE Sensitive <=0.5 SENSI... Sensitive Right foot bone . Component 3 wk ago Specimen Description BONE Special Requests RIGHT 4 METATARSAL SAMPLE B Gram Stain NO WBC SEEN NO ORGANISMS SEEN Culture RARE METHICILLIN RESISTANT STAPHYLOCOCCUS AUREUS NO ANAEROBES ISOLATED Performed at [facility], [place] [address] [place], [place] [address] Report Status 10/08/2019 FINAL Organism ID, Bacteria METHICILLIN RESISTANT STAPHYLOCOCCUS AUREUS Resulting Agency [facility] Susceptibility Methicillin resistant staphylococcus aureus MIC CIPROFLOXACIN >=8 RESISTANT Resistant CLINDAMYCIN <=0.25 SENS... Sensitive ERYTHROMYCIN >=8 RESISTANT Resistant GENTAMICIN <=0.5 SENSI... Sensitive Inducible Clindamycin NEGATIVE Sensitive OXACILLIN >=4 RESISTANT Resistant RIFAMPIN <=0.5 SENSI... Sensitive TETRACYCLINE <=1 SENSITIVE Sensitive TRIMETH/SULFA <=10 SENSIT Sensitive ... VANCOMYCIN 1 SENSITIVE Sensitive This is a patient we had in clinic earlier this year with a wound over  his left fifth metatarsal head. 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 Doran Durand on 05/16/2019. He seems to have developed problems with wounds on his bilateral feet in June 2021 although he may have had problems earlier than this. He was in an urgent care with a right foot ulcer on 09/26/2019 and given a course of doxycycline. This was apparently after having trouble getting into see orthopedics. He was seen by podiatry on 09/28/2019 noted to have bilateral lower extremity ulcers including the left lateral fifth metatarsal base and the right subfifth met head. It was noted that had purulent drainage at that time. He required hospitalization from 6/20 through 7/2. This was because of worsening right foot wounds. He underwent bilateral operative incision and drainage and bone biopsies bilaterally. Culture results are listed above. He has been referred back to clinic by Dr. Jacqualyn Posey of podiatry. He is also followed by Dr. Megan Salon who saw him yesterday. He was discharged from hospital on Zyvox Flagyl and Levaquin and yesterday changed to doxycycline Flagyl and Levaquin. His inflammatory markers on 6/26 showed a  sedimentation rate of 129 and a C-reactive protein of 5. This is improved to 14 and 1.3 respectively. This would indicate improvement. ABIs in our clinic today were 1.23 on the right and 1.20 on the left 11/01/2019 on evaluation today patient appears to be doing fairly well in regard to the wounds on his feet at this point. Fortunately there is no signs of active infection at this time. No fevers, chills, nausea, vomiting, or diarrhea. He currently is seeing infectious disease and still under their care at this point. Subsequently he also has both wounds which she has not been using collagen on as he did not receive that in his packaging he did not call us and let us know that. Apparently that just was missed on the order. Nonetheless we will get that straightened out today. 8/9-Patient returns for bilateral foot wounds, using Prisma with hydrogel moistened dressings, and the wounds appear stable. Patient using surgical shoes, avoiding much pressure or weightbearing as much as possible 8/16; patient has bilateral foot wounds. 1 on the right lateral foot proximally the other is on the left mid lateral foot. Both required debridement of callus and thick skin around the wounds. We have been using silver collagen 8/27; patient has bilateral lateral foot wounds. The area on the left substantially surrounded by callus and dry skin. This was removed from the wound edge. The underlying wound is small. The area on the right measured somewhat smaller today. 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 Unfortunately I did not update his antibiotics during today's visit. 9/10 I reviewed Dr. Hale Bogus last notes he felt he had completed antibiotics his inflammatory markers were reasonably well controlled. He has a small wound on the lateral left foot and a tiny area on the right which is just above closed. He is using Hydrofera Blue with border foam  he has bilateral surgical shoes 9/24; 2 week f/u. doing well. right foot is closed. left foot still undermined. 10/14; right foot remains closed at the fifth met head. The area over the base of the left fifth metatarsal has a small open area  but considerable undermining towards the plantar foot. Thick callus skin around this suggests an adequate pressure relief. We have talked about this. He says he is going to go back into his cam boot. I suggested a total contact cast he did not seem enamored with this suggestion 10/26; left foot base of the fifth metatarsal. Same condition as last time. He has skin over the area with an open wound however the skin is not adherent. He went to see Dr. Earleen Newport who did an x-ray and culture of his foot I have not reviewed the x-ray but the patient was not told anything. He is on doxycycline 11/11; since the patient was last here he was in the emergency room on 10/30 he was concerned about swelling in the left foot. They did not do any cultures or x-rays. They changed his antibiotics to cephalexin. Previous culture showed group B strep. The cephalexin is appropriate as doxycycline has less than predictable coverage. Arrives in clinic today with swelling over this area under the wound. He also has a new wound on the right fifth metatarsal head 11/18; the patient has a difficult wound on the lateral aspect of the left fifth metatarsal head. The wound was almost ballotable last week I opened it slightly expecting to see purulence however there was just bleeding. I cultured this this was negative. X-ray unchanged. We are trying to get an MRI but I am not sure were going to be able to get this through his insurance. He also has an area on the right lateral fifth metatarsal head this looks healthier 12/3; the patient finally got our MRI. Surprisingly this did not show osteomyelitis. I did show the soft tissue ulceration at the lateral plantar aspect of the fifth metatarsal base  with a tiny residual 6 mm abscess overlying the superficial fascia I have tried to culture this area I have not been able to get this to grow anything. Nevertheless the protruding tissue looks aggravated. I suspect we should try to treat the underlying "abscess with broad-spectrum antibiotics. I am going to start him on Levaquin and Flagyl. He has much less edema in his legs and I am going to continue to wrap his legs and see him weekly 12/10. I started Levaquin and Flagyl on him last week. He just picked up the Flagyl apparently there was some delay. The worry is the wound on the left fifth metatarsal base which is substantial and worsening. His foot looks like he inverts at the ankle making this a weightbearing surface. Certainly no improvement in fact I think the measurements of this are somewhat worse. We have been using 12/17; he apparently just got the Levaquin yesterday this is 2 weeks after the fact. He has completed the Flagyl. The area over the left fifth metatarsal base still has protruding granulation tissue although it does not look quite as bad as it did some weeks ago. He has severe bilateral lymphedema although we have not been treating him for wounds on his legs this is definitely going to require compression. There was so much edema in the left I did not wish to put him in a total contact cast today. I am going to increase his compression from 3-4 layer. The area on the right lateral fifth met head actually look quite good and superficial. 12/23; patient arrived with callus on the right fifth met head and the substantial hyper granulated callused wound on the base of his fifth metatarsal. He says he is completing his Levaquin  in 2 days but I do not think that adds up with what I gave him but I will have to double check this. We are using Hydrofera Blue on both areas. My plan is to put the left leg in a cast the week after New Year's 04/06/2020; patient's wounds about the same. Right  lateral fifth metatarsal head and left lateral foot over the base of the fifth metatarsal. There is undermining on the left lateral foot which I removed before application of total contact cast continuing with Hydrofera Blue new. Patient tells me he was seen by endocrinology today lab work was done [Dr. Kerr]. Also wondering whether he was referred to cardiology. I went over some lab work from previously does not have chronic renal failure certainly not nephrotic range proteinuria he does have very poorly controlled diabetes but this is not his most updated lab work. Hemoglobin A1c has been over 11 1/10; the patient had a considerable amount of leakage towards mid part of his left foot with macerated skin however the wound surface looks better the area on the right lateral fifth met head is better as well. I am going to change the dressing on the left foot under the total contact cast to silver alginate, continue with Hydrofera Blue on the right. 1/20; patient was in the total contact cast for 10 days. Considerable amount of drainage although the skin around the wound does not look too bad on the left foot. The area on the right fifth metatarsal head is closed. Our nursing staff reports large amount of drainage out of the left lateral foot wound 1/25; continues with copious amounts of drainage described by our intake staff. PCR culture I did last week showed E. coli and Enterococcus faecalis and low quantities. Multiple resistance genes documented including extended spectrum beta lactamase, MRSA, MRSE, quinolone, tetracycline. The wound is not quite as good this week as it was 5 days ago but about the same size 2/3; continues with copious amounts of malodorous drainage per our intake nurse. The PCR culture I did 2 weeks ago showed E. coli and low quantities of Enterococcus. There were multiple resistance genes detected. I put Neosporin on him last week although this does not seem to have helped. The  wound is slightly deeper today. Offloading continues to be an issue here although with the amount of drainage she has a total contact cast is just not going to work 2/10; moderate amount of drainage. Patient reports he cannot get his stocking on over the dressing. I told him we have to do that the nurse gave him suggestions on how to make this work. The wound is on the bottom and lateral part of his left foot. Is cultured predominantly grew low amounts of Enterococcus, E. coli and anaerobes. There were multiple resistance genes detected including extended spectrum beta lactamase, quinolone, tetracycline. I could not think of an easy oral combination to address this so for now I am going to do topical antibiotics provided by [facility] I think the main agents here are vancomycin and an aminoglycoside. We have to be able to give him access to the wounds to get the topical antibiotic on 2/17; moderate amount of drainage this is unchanged. He has his Keystone topical antibiotic against the deep tissue culture organisms. He has been using this and changing the dressing daily. Silver alginate on the wound surface. 2/24; using Keystone antibiotic with silver alginate on the top. He had too much drainage for a total contact cast  at one point although I think that is improving and I think in the next week or 2 it might be possible to replace a total contact cast I did not do this today. In general the wound surface looks healthy however he continues to have thick rims of skin and subcutaneous tissue around the wide area of the circumference which I debrided 06/04/2020 upon evaluation today patient appears to be doing well in regard to his wound. I do feel like he is showing signs of improvement. There is little bit of callus and dead tissue around the edges of the wound as well as what appears to be a little bit of a sinus tract that is off to the side laterally I would perform debridement to clear that away  today. 3/17; left lateral foot. The wound looks about the same as I remember. Not much depth surface looks healthy. No evidence of infection 3/25; left lateral foot. Wound surface looks about the same. Separating epithelium from the circumference. There really is no evidence of infection here however not making progress by my view 3/29; left lateral foot. Surface of the wound again looks reasonably healthy still thick skin and subcutaneous tissue around the wound margins. There is no evidence of infection. One of the concerns being brought up by the nurses has again the amount of drainage vis--vis continued use of a total contact cast 4/5; left lateral foot at roughly the base of the fifth metatarsal. Nice healthy looking granulated tissue with rims of epithelialization. The overall wound measurements are not any better but the tissue looks healthy. The only concern is the amount of drainage although he has no surrounding maceration with what we have been doing recently to absorb fluid and protect his skin. He also has lymphedema. 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 He states that the kerlix/coban under the TCC rolled down and could not get it back up. He has been using an offloading scooter and has somehow hurt his right foot using this device. This happened last week. He states that the side of his right foot developed a blister and opened. The top of his foot also has a few small open wounds he thinks is due to his socks rubbing in his shoes. He has not been using any dressings to the wound. He denies purulent drainage, fever/chills or erythema to the wounds. 4/22; patient presents for 1 week follow-up. He developed new wounds to the right foot  that were evaluated at last clinic visit. He continues to have a total contact cast to the left leg and he reports no issues. He has been using silver collagen to the right foot wounds with no issues. He denies purulent drainage, fever/chills or erythema to the right foot wounds. He has no complaints today 4/25; patient presents for 1 week follow-up. He has a total contact cast of the left leg and reports no issues. He has been using silver alginate to the right foot wound. He denies purulent drainage, fever/chills or erythema to the right foot wounds. 5/2 patient presents for 1 week follow-up. T contact cast on the left. The wound which is on the base of the plantar foot at the base of the fifth metatarsal otal  actually looks quite good and dimensions continue to gradually contract. HOWEVER the area on the right lateral fifth metatarsal head is much larger than what I remember from 2 weeks ago. Once more is he has significant levels of hypergranulation. Noteworthy that he had this same hyper granulated response on his wound on the left foot at one point in time. So much so that he I thought there was an underlying fluid collection. Based on this I think this just needs debridement. 5/9; the wound on the left actually continues to be gradually smaller with a healthy surface. Slight amount of drainage and maceration of the skin around but not too bad. However he has a large wound over the right fifth metatarsal head very much in the same configuration as his left foot wound was initially. I used silver nitrate to address the hyper granulated tissue no mechanical debridement 5/16; area on the left foot did not look as healthy this week deeper thick surrounding macerated skin and subcutaneous tissue. oo The area on the right foot fifth met head was about the same oo The area on the right ankle that we identified last week is completely broken down into an open wound presumably a stocking rubbing  issue 5/23; patient has been using a total contact cast to the left side. He has been using silver alginate underneath. He has also been using silver alginate to the right foot wounds. He has no complaints today. He denies any signs of infection. 5/31; the left-sided wound looks some better measure smaller surface granulation looks better. We have been using silver alginate under the total contact cast oo The large area on his right fifth met head and right dorsal foot look about the same still using silver alginate 6/6; neither side is good as I was hoping although the surface area dimensions are better. A lot of maceration on his left and right foot around the wound edge. Area on the dorsal right foot looks better. He says he was traveling. I am not sure what does the amount of maceration around the plantar wounds may be drainage issues 6/13; in general the wound surfaces look quite good on both sides. Macerated skin and raised edges around the wound required debridement although in general especially on the left the surface area seems improved. oo The area on the right dorsal ankle is about the same I thought this would not be such a problem to close 6/20; not much change in either wound although the one on the right looks a little better. Both wounds have thick macerated edges to the skin requiring debridements. We have been using silver alginate. The area on the dorsal right ankle is still open I thought this would be closed. 6/28; patient comes in today with a marked deterioration in the right foot wound fifth met head. Wide area of exposed bone this is a drastic change from last time. The area on the left there we have been casting is stagnant. We have been using silver alginate in both wound areas. 7/5; bone culture I did for PCR last time was positive for Pseudomonas, group B strep, Enterococcus and Staph aureus. There was no suggestion of methicillin resistance or ampicillin resistant  genes. This was resistant to tetracycline however He comes into the clinic today with the area over his right plantar fifth metatarsal head which had been doing so well 2 weeks ago completely necrotic feeling bone. I do not know that this is going to be salvageable. The  left foot wound is certainly no smaller but it has a better surface and is superficial. 7/8; patient called in this morning to say that his total contact cast was rubbing against his foot. He states he is doing fine overall. He denies signs of infection. 7/12; continued deterioration in the wound over the right fifth metatarsal head crumbling bone. This is not going to be salvageable. The patient agrees and wants to be referred to Dr. Doran Durand which we will attempt to arrange as soon as possible. I am going to continue him on antibiotics as long as that takes so I will renew those today. The area on the left foot which is the base of the fifth metatarsal continues to look somewhat better. Healthy looking tissue no depth no debridement is necessary here. 7/20; the patient was kindly seen by Dr. Doran Durand of orthopedics on 10/19/2020. 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 Towards this end we have taken him out of the total contact cast on the left we will put him in a wrap with Hydrofera Blue. As I understand things surgery is planned for 7/21 7/27; patient had his surgery last Thursday. 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 Apparently everything went well we did not still disturb that today The area on the left foot actually looks quite good. He has been much less mobile which probably explains this he did not seem to do well in the total contact cast secondary to drainage and maceration I think. We have been using Hydrofera Blue 11/09/2020 upon evaluation today patient appears to be doing well with regard to his plantar foot ulcer on the left  foot. Fortunately there is no evidence of active infection at this time. No fevers, chills, nausea, vomiting, or diarrhea. Overall I think that he is actually doing extremely well. Nonetheless I do believe that he is staying off of this more following the surgery in his right foot that is the reason the left is doing so great. 8/16; left plantar foot wound. This looks smaller than the last time I saw this he is using Hydrofera Blue. The surgical wound on the right foot is being followed by Dr. Doran Durand we did not look at this today. He has surgical shoes on both feet 8/23; left plantar foot wound not as good this week. Surrounding macerated skin and subcutaneous tissue everything looks moist and wet. I do not think he is offloading this adequately. He is using a surgical shoe Apparently the right foot surgical wound is not open although I did not check his foot 8/31; left plantar foot lateral aspect. Much improved this week. He has no maceration. Some improvement in the surface area of the wound but most impressively the depth is come in we are using silver alginate. The patient is a Product/process development scientist. He is asked that we write him a letter so he can go back to work. I have also tried to see if we can write something that will allow him to limit the amount of time that he is on his foot at work. 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 Hopefully the latter can be avoided 9/6; I believe the patient missed an appointment last week. He arrives in today with a wound looking roughly the same certainly no  better. Undermining laterally and also inferiorly. We used molecuLight today in training with the patient's permission.. We are using silver alginate 9/21 wound is measuring bigger this week although this may have to do with the aggressive circumferential debridement last week in response to the blush fluorescence on the  MolecuLight. Culture I did last week showed significant MSSA and E. coli. I put him on Augmentin but he has not started it yet. We are also going to send this for compounded antibiotics at [facility] There is no evidence of systemic infection 9/29; silver alginate. His Keystone arrived. He is completing Augmentin in 2 days. Offloading in a cam boot. Moderate drainage per our intake staff 10/5; using silver alginate. He has been using his [place]. He has completed his Augmentin. Per our intake nurse still a lot of drainage, far too much to consider a total contact cast. Wound measures about the same. He had the same undermining area that I defined last week from a roughly 11-3. I remove this today 10/12; using silver alginate he is using the [place]. He comes in for a nurse visit hence we are applying [employer] twice a week. Measuring slightly better today and less notable drainage. Extensive debridement of the wound edge last time 10/18; using topical Keystone and silver alginate and a soft cast. Wound measurements about the same. Drainage was through his soft cast. We are changing this twice a week Tuesdays and Friday 10/25; comes in with moderate drainage. Still using Keystone silver alginate and a soft cast. Wound dimensions completely the same.He has a lot of edema in the left leg he has lymphedema. Asking for [name] to consider wrapping him as he cannot get his stocking on over the soft cast 11/2; comes in with moderate to large drainage slightly smaller in terms of width we have been using [place]. His wound looks satisfactory but not much improvement 11/4; patient presents today for obligatory cast change. Has no issues or complaints today. He denies signs of infection. 11/9; patient traveled this weekend to [place], was on the cast quite a bit. Staining of the cast with black material from his walking boot. Drainage was not quite as bad as we feared. Using silver alginate and Keystone 11/16; we do  not have size for cast therefore we have been putting a soft cast on him since the change on Friday. Still a significant amount of drainage necessitating changing twice a week. We have been using the Keystone at cast changes either hard or soft as well as silver alginate Comes in the clinic with things actually looking fairly good improvement in width. He says his offloading is about the same 02/24/2021 upon evaluation today patient actually comes back in and is doing excellent in regard to his foot ulcer this is significantly smaller even compared to the last visit. The soft cast seems to have done extremely well for him which is great news. I do not see any signs of infection minimal debridement will be needed today. 11/30; left lateral foot much improved half a centimeter improvement in surface area. No evidence of infection. He seems to be doing better with the soft cast in the TCC therefore we will continue with this. He comes back in later in the week for a change with the nurses. This is due to drainage 12/6; no improvement in dimensions. Under illumination some debris on the surface we have been using silver alginate, soft cast. If there is anything optimistic here he seems to have have less  drainage 12/13. Dimensions are improved both length and width and slightly in depth. Appears to be quite healthy today. Raised edges of this thick skin and callus around the edges however. He is in a soft cast were bringing him back once for a change on Friday. Drainage is better 12/20. Dimensions are improved. He still has raised edges of thick skin and callus around the edges. We are using a soft cast 12/28; comes in today with thick callus around the wound. Using silver under alginate under a soft cast. I do not think there is much improvement in any measurement 2023 04/06/2021; patient was put in a total contact cast. Unfortunately not much change in surface area 1/10; not much different still thick  callus and skin around the edge in spite of the total contact cast. This was just debrided last week we have been using the [facility] compounded antibiotic and silver alginate under a total contact cast 1/18 the patient's wound on the left side is doing nicely. smaller HOWEVER he comes in today with a wound on the right foot laterally. blister most likely serosangquenous drainage 1/24; the patient continues to do well in terms of the plantar left foot which is continued to contract using silver alginate under the total contact cast HOWEVER the right lateral foot is bigger with denuded skin around the edges. I used pickups and a #15 scalpel to remove this this looks like the remanence of a large blister. Cannot rule out infection. Culture in this area I did last week showed Staphylococcus lugdunensis few colonies. I am going to try to address this with his [employer] antibiotic that is done so well on the left having linezolid and this should cover the staph 2/1; the patient's wound on his left foot which was the original plantar foot wound thick skin and eschar around the edges even in the total contact cast but the wound surface does not look too bad The real problem is on how his right lateral foot at roughly the base of the fifth metatarsal. The wound is completely necrotic more worrisome than that there is swelling around the edges of this. We have been using silver alginate on both wounds and Keystone on the right foot. Unfortunately I think he is going to require systemic antibiotics while we await cultures. He did not get the x-ray done that we ordered last week [lost the prescription 2/7; disappointingly in the area on the left foot which we are treating with a total contact cast is still not closed although it is much smaller. He continues to have a lot of callus around the wound edge. -Right lateral foot culture I did last week was negative x-ray also negative for osteomyelitis. 2/15: TCC  silver alginate on the left and silver alginate on the right lateral. No real improvement in either area 05/26/2021: T oday, the wounds are roughly the same size as at his previous visit, post-debridement. He continues to endorse fairly substantial drainage, particularly on the right. He has been in a total contact cast on the left. There is still some callus surrounding this lesion. On the right, the periwound skin is quite macerated, along with surrounding callus. The center of the right-sided wound also has some dark, densely adherent material, which is very difficult to remove. 06/02/2021: Today, both wounds are slightly smaller. He has been using zinc oxide ointment around the right ulcer and the degree of maceration has improved markedly. There continues to be an area of nonviable tissue in  the center of the right sided ulcer. The left-sided wound, which has been in the total contact cast. Appears clean and the degree of callus around it is less than previously. 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 He had to take the stairs and both wounds have increased in size. The left foot, which has been in a total contact cast, has developed a tunnel tracking to the lateral aspect of his foot. The nonviable tissue in the center of the right-sided ulcer remains recalcitrant to debridement. There is significant undermining surrounding the entirety of the left sided wound. 06/16/2021: The elevator at school has been fixed and the patient has been able to avoid putting as much weight on his wounds over the past week. We converted the left foot wound into a single lesion today, but despite this, the wound is actually smaller. The base is healthy with limited periwound callus. On the right, the central necrotic area is still present. He continues to be quite macerated around the right sided wound, despite applying barrier cream. This  does, however, have the benefit of softening the callus to make it more easily removable. 06/23/2021: Today, the left wound is smaller. The lateral aspect that had opened up previously is now closed. The wound base has a healthy bed of granulation tissue and minimal slough. Unfortunately, on the right, the wound is larger and continues to be fairly macerated. He has also reopened the wound at his right ankle. He thinks this is due to the gait he has adopted secondary to his total contact cast and boot. 06/30/2021: T oday, both wounds are a little bit larger. The lateral aspect on the left has remained closed. He continues to have significant periwound maceration. The culture that I took from the right sided wound grew a population of bacteria that is not covered by his current [facility] antibiotic. The center of the right- sided wound continues to appear necrotic with nonviable fat. It probes deeper today, but does not reach bone. 07/07/2021: The periwound maceration is a little bit less today. The right lateral foot wound has some areas that appear more viable and the necrotic center also looks a little bit better. The wound on the dorsal surface of his right foot near the ankle is contracting and the surface appears healthy. The left plantar wound surface looks healthy, but there is some new undermining on the medial portion. He did get his new Keystone antibiotic and began applying that to the right foot wound on Saturday. 07/14/2021: The intake nurse reported substantial drainage from his wounds, but the periwound skin actually looks better than is typical for him. The wound on the dorsal surface of his right foot near the ankle is smaller and just has a small open area underneath some dried eschar. The left plantar wound surface looks healthy and there has been no significant accumulation of callus. The right lateral foot wound looks quite a bit better, with the central portion, which typically appears  necrotic, looking more viable albeit pale. 07/22/2021: His left foot is extremely macerated today. The wound is about the same size. The wound on the dorsal surface of his right foot near the ankle had closed, but he traumatized it removing the dressing and there is a tiny skin tear in that location. The right lateral foot wound is bigger, but the surface appears healthy. 07/30/2021: The wound on the dorsal surface of his right foot near the  ankle is closed. The right lateral foot wound again is a little bit bigger due to some undermining. The periwound skin is in better condition, however. He has been applying zinc oxide. The wound surface is a little bit dry today. On the left, he does not have the substantial maceration that we frequently see. The wound itself is smaller and has a clean surface. 08/06/2021: Both wounds seem to have deteriorated over the past week. The right lateral foot wound has a dry surface but the periwound is boggy.. Overall wound dimensions are about the same. On the left, the wound is about the same size, but there is more undermining present underneath periwound callus. 08/13/2021: The right sided wound looks about the same, but on the left there has been substantial deterioration. The undermining continues to extend under periwound callus. Once this was removed, substantial extension of the wound was present. There is no odor or purulent drainage but clearly the wounds have broken down. 08/20/2021: The wounds look about the same today. He has been out of his total contact cast and has just been changing the dressings using topical Keystone with PolyMem Ag, Kerlix and Ace bandages. The wound on the top of his right ankle has reopened but this is quite small. There was a little bit of purulent material that I expressed when examining this wound. 08/24/2021: After the aggressive debridement I performed at his last visit, the wounds actually look a little bit better today. They are  smaller with the exception of the wound on the top of his right ankle which is a little bit bigger as some more skin pulled off when he was changing his dressing. We are using topical Keystone with PolyMem Ag Kerlix and Ace bandages. 09/02/2021: There has been really no change to any of his wounds. 09/16/2021: The patient was hospitalized last week with nausea, vomiting, and dehydration. He says that while he was in the hospital, his wounds were not really addressed properly. T oday, both plantar foot wounds are larger and the periwound skin is macerated. The wound on the dorsum of his right foot has a scab on the top. The right foot now has a crater where previously he had had nonviable fat. It looks as though this simply died and fell out. The periwound callus is wet. 09/24/2021: His wounds have deteriorated somewhat since his last visit. The wound on the dorsum of his right foot near his ankle is larger and has more nonviable tissue present. The crater in his right foot is even deeper; I cannot quite palpate or probe to bone but I am sure it is close. The wound on his left plantar foot has an odd boggy area in the center that almost feels as though it has fluid within it. He has run out of his topical Keystone antibiotic. We are using silver alginate on his wounds. 09/29/2021: He has developed a new wound on the dorsum of his left foot near his ankle. He says he thinks his wrapping is rubbing in that site. I would concur with this as the wound on his right ankle is larger. The left foot looks about the same. The right foot has the crater that was present last week. No significant slough accumulation, but his foot remains quite swollen and warm despite oral antibiotic therapy. 10/08/2021: All of his wounds look about the same as last week. He did not start his oral antibiotics that are prescribed until just a couple of days ago; his Keystone  compounded antibiotics formula has been changed and he is  awaiting delivery of the new recipe. His MRI that was scheduled for earlier this week was canceled as no prior authorization had been obtained; unfortunately the tech responsible sent an email to my old [place] email, which I no longer use nor have access to. 10/18/2021: The wounds on his bilateral dorsal feet near the ankles are both improved. They are smaller and have just some eschar and slough buildup. The left plantar wound has a fair amount of undermining, but the surface is clean. There is some periwound callus accumulation. On the right plantar foot, there is nonviable fat leading to a deep tunnel that tracks towards his dorsal medial foot. There is periwound callus and slough accumulation, as well. His right foot and leg remain swollen as compared to the left. 10/25/2021: The wounds on his bilateral dorsal feet and at the ankles have broken down somewhat. They are little bit larger than last week. The left plantar wound continues to undermine laterally but the surface is clean. The right plantar foot wound shows some decreased depth in the tunnel tracking towards his dorsal medial foot. He has not yet had the Doppler study that I ordered; it sounds like there is some confusion about the scheduling of the procedure. In addition, the MRI was denied and I have taken steps to appeal the denial. 11/24/2021: Since our last visit, Mr. Mealey was admitted to the hospital where an MRI suggested osteomyelitis. He was taken the operating room by podiatry. Bone biopsies were negative for osteomyelitis. They debrided his wounds and applied myriad matrix. He saw them last week and they removed his staples. He is here today to continue his wound healing process. T oday, both of the dorsal foot/ankle wounds are substantially smaller. There is just a little eschar overlying the left sided wound and some eschar and slough on the right. The right plantar foot ulcer has the healthiest surface of granulation  tissue that I have seen to date. A portion of the myriad matrix failed to take and was hanging loose. It appears that myriad morcells were placed into the tunnel closest to the dorsal portion of his foot. These have sloughed off. The left plantar foot ulcer is about the same size, but has a much healthier surface than in the past. Both plantar ulcers have callus and slough accumulation. 12/07/2021: Left dorsal foot/ankle wound is closed. The right dorsal foot/ankle wound is nearly closed and just has a small open area with some eschar and slough. The right plantar foot wound has contracted quite a bit since our last visit. It has a healthy surface with just a little bit of slough accumulation and periwound callus buildup. The left plantar foot wound is about the same size but the surface appears healthy. There is a little slough and periwound callus on this side, as well. 12/15/2021: Both dorsal foot/ankle wounds are closed. The right plantar foot wound is substantially smaller than at our last visit. The tunneling that was present has nearly closed. There is just a little bit of slough buildup. The left plantar foot wound is also a little bit smaller today. The surface is the healthiest that I have ever seen it. Light slough and periwound callus accumulation on this side. Patient History Information obtained from Patient. Family History No family history of Cancer, Diabetes, Hereditary Spherocytosis, Hypertension, Kidney Disease, Lung Disease, Seizures, Stroke, Thyroid Problems, Tuberculosis. Social History Never smoker, Marital Status - Single, Alcohol Use -  Rarely, Drug Use - No History, Caffeine Use - Never. Medical History Eyes Denies history of Cataracts, Glaucoma, Optic Neuritis Ear/Nose/Mouth/Throat Denies history of Chronic sinus problems/congestion, Middle ear problems Hematologic/Lymphatic Denies history of Anemia, Hemophilia, Human Immunodeficiency Virus, Lymphedema, Sickle Cell  Disease Respiratory Denies history of Aspiration, Asthma, Chronic Obstructive Pulmonary Disease (COPD), Pneumothorax, Sleep Apnea, Tuberculosis Cardiovascular Denies history of Angina, Arrhythmia, Congestive Heart Failure, Coronary Artery Disease, Deep Vein Thrombosis, Hypertension, Hypotension, Myocardial Infarction, Peripheral Arterial Disease, Peripheral Venous Disease, Phlebitis, Vasculitis Gastrointestinal Denies history of Cirrhosis , Colitis, Crohnoos, Hepatitis A, Hepatitis B, Hepatitis C Endocrine Patient has history of Type II Diabetes Denies history of Type I Diabetes Immunological Denies history of Lupus Erythematosus, Raynaudoos, Scleroderma Integumentary (Skin) Denies history of History of Burn Musculoskeletal Denies history of Gout, Rheumatoid Arthritis, Osteoarthritis, Osteomyelitis Neurologic Denies history of Dementia, Neuropathy, Quadriplegia, Paraplegia, Seizure Disorder Oncologic Denies history of Received Chemotherapy, Received Radiation Psychiatric Denies history of Anorexia/bulimia, Confinement Anxiety Hospitalization/Surgery History - 11/1-11/06/2018- sepsis foot infection. - 11/4-11/5 02 sats low respiratory distress. Objective Constitutional Slightly hypertensive. No acute distress.. Vitals Time Taken: 8:42 AM, Height: 77 in, Weight: 280 lbs, BMI: 33.2, Temperature: 98.5 F, Pulse: 89 bpm, Respiratory Rate: 18 breaths/min, Blood Pressure: 148/94 mmHg. Respiratory Normal work of breathing on room air.. General Notes: 12/15/2021: Both dorsal foot/ankle wounds are closed. The right plantar foot wound is substantially smaller than at our last visit. The tunneling that was present has nearly closed. There is just a little bit of slough buildup. The left plantar foot wound is also a little bit smaller today. The surface is the healthiest that I have ever seen it. Light slough and periwound callus accumulation on this side. Integumentary (Hair, Skin) Wound #11  status is Healed - Epithelialized. Original cause of wound was Gradually Appeared. The date acquired was: 08/20/2021. The wound has been in treatment 16 weeks. The wound is located on the [place]. The wound measures 0cm length x 0cm width x 0cm depth; 0cm^2 area and 0cm^3 volume. There is no tunneling or undermining noted. There is a none present amount of drainage noted. The wound margin is distinct with the outline attached to the wound base. There is no granulation within the wound bed. There is no necrotic tissue within the wound bed. Wound #3 status is Open. Original cause of wound was Trauma. The date acquired was: 10/02/2019. The wound has been in treatment 111 weeks. The wound is located on the [place]. The wound measures 3.2cm length x 3.8cm width x 0.3cm depth; 9.55cm^2 area and 2.865cm^3 volume. There is Fat Layer (Subcutaneous Tissue) exposed. There is no tunneling or undermining noted. There is a medium amount of serosanguineous drainage noted. The wound margin is well defined and not attached to the wound base. There is small (1-33%) pink, hyper - granulation within the wound bed. There is a medium (34-66%) amount of necrotic tissue within the wound bed including Adherent Slough. Wound #8 status is Open. Original cause of wound was Blister. The date acquired was: 04/18/2021. The wound has been in treatment 34 weeks. The wound is located on the Right,Lateral Foot. The wound measures 2.2cm length x 2.4cm width x 0.1cm depth; 4.147cm^2 area and 0.415cm^3 volume. There is Fat Layer (Subcutaneous Tissue) exposed. There is no undermining noted, however, there is tunneling at 9:00 with a maximum distance of 0.3cm. There is a large amount of serosanguineous drainage noted. The wound margin is well defined and not attached to the wound base. There is  large (67-100%) red, pink granulation within the wound bed. There is a small (1-33%) amount of necrotic tissue within the  wound bed including Adherent Slough. 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 There was a Selective/Open Wound Non-Viable Tissue Debridement with a total area of 12.16 sq cm performed by [name], MD. With the following instrument(s): Curette to remove Non-Viable tissue/material. Material removed includes [facility]. No specimens were taken. A time out was conducted at 09:01, prior to the start of the procedure. A Minimum amount of bleeding was controlled with Pressure. The procedure was tolerated well with a pain level of 0 throughout and a pain level of 0 following the procedure. Post Debridement Measurements: 3.2cm length x 3.8cm width x 0.3cm depth; 2.865cm^3 volume. Character of Wound/Ulcer Post Debridement is improved. Severity of Tissue Post Debridement is: Fat layer exposed. 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 There was a Excisional Skin/Subcutaneous Tissue Debridement with a total area of 5.28 sq cm performed by [name], MD. With the following instrument(s): Curette to remove Non-Viable tissue/material. Material removed includes Subcutaneous Tissue and Slough and. No specimens were taken. A time out was conducted at 09:01, prior to the start of the procedure. A Minimum amount of bleeding was controlled  with Pressure. The procedure was tolerated well with a pain level of 0 throughout and a pain level of 0 following the procedure. Post Debridement Measurements: 2.2cm length x 2.4cm width x 0.1cm depth; 0.415cm^3 volume. Character of Wound/Ulcer Post Debridement is improved. Severity of Tissue Post Debridement is: Fat layer exposed. Post procedure Diagnosis Wound #8: Same as Pre-Procedure Plan Follow-up Appointments: Return Appointment in 1 week. - Dr. [name] - Room 1 with [facility] Wednesday 12/22/21 at 08:15 AM Return Appointment in 2 weeks. - Dr. [name] - Room 1 with [facility] Wednesday 12/29/21 at 08:15 AM Bathing/ Shower/ Hygiene: May shower and wash wound with soap and water. Edema Control - Lymphedema / SCD / Other: Elevate legs to the level of the heart or above for 30 minutes daily and/or when sitting, a frequency of: - throughout the day Avoid standing for long periods of time. Exercise regularly Compression stocking or Garment 20-30 mm/Hg pressure to: - to both legs daily Off-Loading: Open toe surgical shoe to: - Both feet Additional Orders / Instructions: Follow Nutritious Diet WOUND #3: - Foot Wound Laterality: Plantar, Left, Lateral Cleanser: Soap and Water 1 x Per Day/30 Days Discharge Instructions: May shower and wash wound with dial antibacterial soap and water prior to dressing change. Peri-Wound Care: Sween Lotion (Moisturizing lotion) 1 x Per Day/30 Days Discharge Instructions: Apply moisturizing lotion as directed Topical: keystone antibiotic compound 1 x Per Day/30 Days Prim Dressing: Promogran Prisma Matrix, 4.34 (sq in) (silver collagen) (Dispense As Written) 1 x Per Day/30 Days ary Discharge Instructions: Moisten collagen with saline or hydrogel Secondary Dressing: ABD Pad, 8x10 (Dispense As Written) 1 x Per Day/30 Days Discharge Instructions: Apply  over primary dressing as directed. Secondary Dressing: Optifoam Non-Adhesive Dressing, 4x4 in (Dispense As Written) 1 x Per  Day/30 Days Discharge Instructions: Apply over primary dressing as directed. Secondary Dressing: NonWoven Sponge, 4x4 (in/in) (Generic) 1 x Per Day/30 Days Secured With: 35M Medipore H Soft Cloth Surgical T ape, 4 x 10 (in/yd) (Generic) 1 x Per Day/30 Days Discharge Instructions: Secure with tape as directed. Com pression Wrap: Kerlix Roll 4.5x3.1 (in/yd) 1 x Per Day/30 Days Discharge Instructions: Apply Kerlix and Coban compression as directed. Com pression Wrap: 35M ACE Elastic Bandage With VELCRO Brand Closure, 4 (in) (Generic) 1 x Per Day/30 Days WOUND #8: - Foot Wound Laterality: Right, Lateral Cleanser: Soap and Water 1 x Per Day/30 Days Discharge Instructions: May shower and wash wound with dial antibacterial soap and water prior to dressing change. Peri-Wound Care: Sween Lotion (Moisturizing lotion) 1 x Per Day/30 Days Discharge Instructions: Apply moisturizing lotion as directed Topical: keystone antibiotic compound 1 x Per Day/30 Days Prim Dressing: Promogran Prisma Matrix, 4.34 (sq in) (silver collagen) (Dispense As Written) 1 x Per Day/30 Days ary Discharge Instructions: Moisten collagen with saline or hydrogel Secondary Dressing: ABD Pad, 8x10 (Dispense As Written) 1 x Per Day/30 Days Discharge Instructions: Apply over primary dressing as directed. Secondary Dressing: Optifoam Non-Adhesive Dressing, 4x4 in (Dispense As Written) 1 x Per Day/30 Days Discharge Instructions: Apply over primary dressing as directed. Secondary Dressing: NonWoven Sponge, 4x4 (in/in) (Generic) 1 x Per Day/30 Days Secured With: 35M Medipore H Soft Cloth Surgical T ape, 4 x 10 (in/yd) (Generic) 1 x Per Day/30 Days Discharge Instructions: Secure with tape as directed. Com pression Wrap: Kerlix Roll 4.5x3.1 (in/yd) 1 x Per Day/30 Days Discharge Instructions: Apply Kerlix and Coban compression as directed. Com pression Wrap: 35M ACE Elastic Bandage With VELCRO Brand Closure, 4 (in) (Generic) 1 x Per Day/30  Days 12/15/2021: Both dorsal foot/ankle wounds are closed. The right plantar foot wound is substantially smaller than at our last visit. The tunneling that was present has nearly closed. There is just a little bit of slough buildup. The left plantar foot wound is also a little bit smaller today. The surface is the healthiest that I have ever seen it. Light slough and periwound callus accumulation on this side. I used a curette to debride slough and nonviable subcutaneous tissue from the right foot, slough from the left foot. We will continue Prisma silver collagen with Kerlix and Ace bandaging. Follow-up in 1 week. Electronic Signature(s) Signed: 12/15/2021 9:22:20 AM By: [name] MD FACS Entered By: [name] on 12/15/2021 09:22:20 -------------------------------------------------------------------------------- HxROS Details Patient Name: Date of Service: [name], [name] 12/15/2021 8:15 A M Medical Record Number: [id number] Patient Account Number: [id number] Date of Birth/Sex: Treating RN: Sep 22, 1985 (36 y.o. M) Primary Care Provider: [name] Other Clinician: Referring Provider: Treating Provider/Extender: [name] in Treatment: 111 Information Obtained From Patient Eyes Medical History: Negative for: Cataracts; Glaucoma; Optic Neuritis Ear/Nose/Mouth/Throat Medical History: Negative for: Chronic sinus problems/congestion; Middle ear problems Hematologic/Lymphatic Medical History: Negative for: Anemia; Hemophilia; Human Immunodeficiency Virus; Lymphedema; Sickle Cell Disease Respiratory Medical History: Negative for: Aspiration; Asthma; Chronic Obstructive Pulmonary Disease (COPD); Pneumothorax; Sleep Apnea; Tuberculosis Cardiovascular Medical History: Negative for: Angina; Arrhythmia; Congestive Heart Failure; Coronary Artery Disease; Deep Vein Thrombosis; Hypertension; Hypotension; Myocardial Infarction; Peripheral Arterial  Disease; Peripheral Venous Disease; Phlebitis; Vasculitis Gastrointestinal Medical History: Negative for: Cirrhosis ; Colitis; Crohns; Hepatitis A; Hepatitis B; Hepatitis C Endocrine Medical History: Positive for: Type II  Diabetes Negative for: Type I Diabetes Time with diabetes: 8 Treated with: Insulin Blood sugar tested every day: No Immunological Medical History: Negative for: Lupus Erythematosus; Raynauds; Scleroderma Integumentary (Skin) Medical History: Negative for: History of Burn Musculoskeletal Medical History: Negative for: Gout; Rheumatoid Arthritis; Osteoarthritis; Osteomyelitis Neurologic Medical History: Negative for: Dementia; Neuropathy; Quadriplegia; Paraplegia; Seizure Disorder Oncologic Medical History: Negative for: Received Chemotherapy; Received Radiation Psychiatric Medical History: Negative for: Anorexia/bulimia; Confinement Anxiety Immunizations Pneumococcal Vaccine: Received Pneumococcal Vaccination: No Implantable Devices None Hospitalization / Surgery History Type of Hospitalization/Surgery 11/1-11/06/2018- sepsis foot infection 11/4-11/5 02 sats low respiratory distress Family and Social History Cancer: No; Diabetes: No; Hereditary Spherocytosis: No; Hypertension: No; Kidney Disease: No; Lung Disease: No; Seizures: No; Stroke: No; Thyroid Problems: No; Tuberculosis: No; Never smoker; Marital Status - Single; Alcohol Use: Rarely; Drug Use: No History; Caffeine Use: Never; Financial Concerns: No; Food, Clothing or Shelter Needs: No; Support System Lacking: No; Transportation Concerns: No Electronic Signature(s) Signed: 12/15/2021 10:10:05 AM By: [name] MD FACS Entered By: [name] on 12/15/2021 09:20:36 -------------------------------------------------------------------------------- SuperBill Details Patient Name: Date of Service: [name], [name] 12/15/2021 Medical Record Number: [id number] Patient Account Number:  [id number] Date of Birth/Sex: Treating RN: April 23, 1985 (36 y.o. 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 L97.518 Non-pressure chronic ulcer of other part of right foot with other specified severity L97.322 Non-pressure chronic ulcer of left ankle with fat layer exposed Facility Procedures CPT4 Code: [id number] Description: [place] - DEB SUBQ TISSUE 20 SQ CM/< ICD-10 Diagnosis Description L97.518 Non-pressure chronic ulcer of other part of right foot with other specified sever Modifier: ity Quantity: 1 CPT4 Code: [id number] I Description: 37902 - DEBRIDE WOUND 1ST 20 SQ CM OR < CD-10 Diagnosis Description L97.528 Non-pressure chronic ulcer of other part of left foot with other specified severity Modifier: Quantity: 1 Physician Procedures : CPT4 Code Description Modifier [id number] - WC PHYS LEVEL 3 - EST PT 25 ICD-10 Diagnosis Description L97.528 Non-pressure chronic ulcer of other part of left foot with other specified severity L97.518 Non-pressure chronic ulcer of other part of  right foot with other specified severity E11.621 Type 2 diabetes mellitus with foot ulcer Quantity: 1 : [id number] 62229 - WC PHYS SUBQ TISS 20 SQ CM ICD-10 Diagnosis Description L97.518 Non-pressure chronic ulcer of other part of right foot with other specified severity Quantity: 1 : [id number] 94174 - WC PHYS DEBR WO ANESTH 20 SQ CM ICD-10 Diagnosis Description L97.528 Non-pressure chronic ulcer of other part of left foot with other specified severity Quantity: 1 Electronic Signature(s) Signed: 12/15/2021 9:22:46 AM By: [name] MD FACS Entered By: [name] on 12/15/2021 09:22:45

## 2021-12-17 ENCOUNTER — Encounter (HOSPITAL_BASED_OUTPATIENT_CLINIC_OR_DEPARTMENT_OTHER): Payer: BC Managed Care – PPO | Admitting: General Surgery

## 2021-12-22 ENCOUNTER — Encounter (HOSPITAL_BASED_OUTPATIENT_CLINIC_OR_DEPARTMENT_OTHER): Payer: BC Managed Care – PPO | Admitting: General Surgery

## 2021-12-22 DIAGNOSIS — E11621 Type 2 diabetes mellitus with foot ulcer: Secondary | ICD-10-CM | POA: Diagnosis not present

## 2021-12-22 NOTE — Progress Notes (Signed)
[place], [name] ([id number]) Visit Report for 12/22/2021 Arrival Information Details Patient Name: Date of Service: [name] 12/22/2021 8:15 A M Medical Record Number: [id number] Patient Account Number: [id number] Date of Birth/Sex: Treating RN: [birth year] (36 y.o. [name] Primary Care Kavya Haag: [name] Other Clinician: Referring Jermar Colter: Treating Ryman Rathgeber/Extender: [name] in Treatment: 63 Visit Information History Since Last Visit Added or deleted any medications: No Patient Arrived: Ambulatory Any new allergies or adverse reactions: No Arrival Time: 08:27 Had a fall or experienced change in No Accompanied By: fiance activities of daily living that may affect Transfer Assistance: None risk of falls: Patient Identification Verified: Yes Signs or symptoms of abuse/neglect since No Secondary Verification Process Completed: Yes last visito Patient Requires Transmission-Based Precautions: No Hospitalized since last visit: No Patient Has Alerts: No Implantable device outside of the clinic No excluding cellular tissue based products placed in the center since last visit: Has Dressing in Place as Prescribed: Yes Has Footwear/Offloading in Place as Yes Prescribed: Left: Surgical Shoe with Pressure Relief Insole Right: Surgical Shoe with Pressure Relief Insole Pain Present Now: No Electronic Signature(s) Signed: 12/22/2021 5:41:34 PM By: Baruch Gouty RN, BSN Entered By: Baruch Gouty on 12/22/2021 08:32:35 -------------------------------------------------------------------------------- Encounter Discharge Information Details Patient Name: Date of Service: [name], [name] 12/22/2021 8:15 A M Medical Record Number: [id number] Patient Account Number: [id number] Date of Birth/Sex: Treating RN: [birth year] (36 y.o. [name] Primary Care Verdis Bassette: [name] Other Clinician: Referring Caliber Landess: Treating  Imanie Darrow/Extender: [name] in Treatment: [phone] Encounter Discharge Information Items Post Procedure Vitals Discharge Condition: Stable Temperature (F): 98.6 Ambulatory Status: Ambulatory Pulse (bpm): 88 Discharge Destination: Home Respiratory Rate (breaths/min): 18 Transportation: Private Auto Blood Pressure (mmHg): 156/96 Accompanied By: fiance Schedule Follow-up Appointment: Yes Clinical Summary of Care: Patient Declined Electronic Signature(s) Signed: 12/22/2021 5:41:34 PM By: Baruch Gouty RN, BSN Entered By: Baruch Gouty on 12/22/2021 09:19:27 -------------------------------------------------------------------------------- Lower Extremity Assessment Details Patient Name: Date of Service: [name], [name] 12/22/2021 8:15 A M Medical Record Number: [id number] Patient Account Number: [id number] Date of Birth/Sex: Treating RN: 1985-08-28 (37 y.o. [name] Primary Care Ennis Delpozo: [name] Other Clinician: Referring Saverio Kader: Treating Shjon Lizarraga/Extender: [name] in Treatment: 112 Edema Assessment Assessed: [name]: No] [name]: No] Edema: [Left: Yes] [Right: Yes] Calf Left: Right: Point of Measurement: 48 cm From Medial Instep 49.8 cm 48.5 cm Ankle Left: Right: Point of Measurement: 11 cm From Medial Instep 27.5 cm 28 cm Vascular Assessment Pulses: Dorsalis Pedis Palpable: [Left:Yes] [Right:Yes] Electronic Signature(s) Signed: 12/22/2021 5:41:34 PM By: Baruch Gouty RN, BSN Entered By: Baruch Gouty on 12/22/2021 08:41:44 -------------------------------------------------------------------------------- Multi Wound Chart Details Patient Name: Date of Service: [name], [name] 12/22/2021 8:15 A M Medical Record Number: [id number] Patient Account Number: [id number] Date of Birth/Sex: Treating RN: 21-Aug-1985 (36 y.o. M) Primary Care Taleeya Blondin: [name] Other Clinician: Referring  Loraine Bhullar: Treating Lyssa Hackley/Extender: [name] in Treatment: 112 Vital Signs Height(in): 77 Capillary Blood Glucose(mg/dl): 79 Weight(lbs): 280 Pulse(bpm): 67 Body Mass Index(BMI): 33.2 Blood Pressure(mmHg): 156/96 Temperature(F): 98.6 Respiratory Rate(breaths/min): 18 Photos: [3:Left, Lateral, Plantar Foot] [8:Right, Lateral Foot] [N/A:N/A N/A] Wound Location: [3:Trauma] [8:Blister] [N/A:N/A] Wounding Event: [3:Diabetic Wound/Ulcer of the LowerDiabetic Wound/Ulcer of the Lower] [N/A:N/A] Primary Etiology: [3:Extremity Type II Diabetes] [8:Extremity Type II Diabetes] [N/A:N/A] Comorbid History: [3:10/02/2019] [8:04/18/2021] [N/A:N/A] Date Acquired: [3:112] [8:35] [N/A:N/A] Weeks of Treatment: [3:Open] [8:Open] [N/A:N/A] Wound Status: [3:No] [8:No] [N/A:N/A] Wound Recurrence: [  3:3.4x4.1x0.5] [8:2.1x2.2x0.9] [N/A:N/A] Measurements L x W x D (cm) [3:10.948] [8:3.629] [N/A:N/A] A (cm) : rea [3:5.474] [8:3.266] [N/A:N/A] Volume (cm) : [3:-563.90%] [8:-559.80%] [N/A:N/A] % Reduction in A [3:rea: -3217.60%] [8:-2869.10%] [N/A:N/A] % Reduction in Volume: [8:9] Starting Position 1 (o'clock): [8:2] Ending Position 1 (o'clock): [8:1] Maximum Distance 1 (cm): [3:No] [8:Yes] [N/A:N/A] Undermining: [3:Grade 2] [8:Grade 2] [N/A:N/A] Classification: [3:Medium] [8:Medium] [N/A:N/A] Exudate A mount: [3:Serosanguineous] [8:Serosanguineous] [N/A:N/A] Exudate Type: [3:red, brown] [8:red, brown] [N/A:N/A] Exudate Color: [3:Well defined, not attached] [8:Well defined, not attached] [N/A:N/A] Wound Margin: [3:Large (67-100%)] [8:Large (67-100%)] [N/A:N/A] Granulation A mount: [3:Red, Hyper-granulation] [8:Red, Pink] [N/A:N/A] Granulation Quality: [3:Small (1-33%)] [8:Small (1-33%)] [N/A:N/A] Necrotic A mount: [3:Fat Layer (Subcutaneous Tissue): Yes Fat Layer (Subcutaneous Tissue): Yes N/A] Exposed Structures: [3:Fascia: No Tendon: No Muscle: No Joint: No Bone: No  Small (1-33%)] [8:Fascia: No Tendon: No Muscle: No Joint: No Bone: No Small (1-33%)] [N/A:N/A] Epithelialization: [3:Debridement - Selective/Open Wound Debridement - Selective/Open Wound N/A] Debridement: Pre-procedure Verification/Time Out 08:55 [8:08:55] [N/A:N/A] Taken: [3:Lidocaine 4% Topical Solution] [8:Lidocaine 4% T opical Solution] [N/A:N/A] Pain Control: [3:Callus] [8:Callus, Slough] [N/A:N/A] Tissue Debrided: [3:Skin/Epidermis] [8:Non-Viable Tissue] [N/A:N/A] Level: [3:20] [8:4.62] [N/A:N/A] Debridement A (sq cm): [3:rea Curette] [8:Curette] [N/A:N/A] Instrument: [3:Minimum] [8:Minimum] [N/A:N/A] Bleeding: [3:Pressure] [8:Pressure] [N/A:N/A] Hemostasis A chieved: [3:0] [8:0] [N/A:N/A] Procedural Pain: [3:0] [8:0] [N/A:N/A] Post Procedural Pain: [3:Procedure was tolerated well] [8:Procedure was tolerated well] [N/A:N/A] Debridement Treatment Response: [3:3.4x4.1x0.5] [8:2.1x2.2x0.9] [N/A:N/A] Post Debridement Measurements L x W x D (cm) [3:5.474] [8:3.266] [N/A:N/A] Post Debridement Volume: (cm) [3:Debridement] [8:Debridement] [N/A:N/A] Treatment Notes Electronic Signature(s) Signed: 12/22/2021 9:11:36 AM By: [name] MD FACS Entered By: [name] on 12/22/2021 09:11:36 -------------------------------------------------------------------------------- Multi-Disciplinary Care Plan Details Patient Name: Date of Service: [name], [name] 12/22/2021 8:15 A M Medical Record Number: [id number] Patient Account Number: [id number] Date of Birth/Sex: Treating RN: [birth year] (36 y.o. [name] Primary Care Ahlayah Tarkowski: [name] Other Clinician: Referring German Manke: Treating Cathren Sween/Extender: [name] in Treatment: [place] reviewed with physician Active Inactive Nutrition Nursing Diagnoses: Imbalanced nutrition Potential for alteratiion in Nutrition/Potential for imbalanced  nutrition Goals: Patient/caregiver agrees to and verbalizes understanding of need to use nutritional supplements and/or vitamins as prescribed Date Initiated: 10/24/2019 Date Inactivated: 04/06/2020 Target Resolution Date: 04/03/2020 Goal Status: Met Patient/caregiver will maintain therapeutic glucose control Date Initiated: 10/24/2019 Target Resolution Date: 01/19/2022 Goal Status: Active Interventions: Assess HgA1c results as ordered upon admission and as needed Assess patient nutrition upon admission and as needed per policy Provide education on elevated blood sugars and impact on wound healing Provide education on nutrition Treatment Activities: Education provided on Nutrition : 11/24/2021 Notes: 11/17/20: Glucose control ongoing issue, target date extended. 01/26/21: Glucose management continues. Wound/Skin Impairment Nursing Diagnoses: Impaired tissue integrity Knowledge deficit related to ulceration/compromised skin integrity Goals: Patient/caregiver will verbalize understanding of skin care regimen Date Initiated: 10/24/2019 Target Resolution Date: 01/19/2022 Goal Status: Active Ulcer/skin breakdown will have a volume reduction of 30% by week 4 Date Initiated: 10/24/2019 Date Inactivated: 01/16/2020 Target Resolution Date: 01/10/2020 Unmet Reason: no change in Goal Status: Unmet measurements. Interventions: Assess patient/caregiver ability to obtain necessary supplies Assess patient/caregiver ability to perform ulcer/skin care regimen upon admission and as needed Assess ulceration(s) every visit Provide education on ulcer and skin care Notes: 11/17/20: Wound care regimen continues Electronic Signature(s) Signed: 12/22/2021 5:41:34 PM By: Baruch Gouty RN, BSN Entered By: Baruch Gouty on 12/22/2021 08:52:39 -------------------------------------------------------------------------------- Pain Assessment Details Patient Name: Date of Service: [name], [name]  12/22/2021  8:15 A M Medical Record Number: [id number] Patient Account Number: [id number] Date of Birth/Sex: Treating RN: 10/08/1985 (36 y.o. [name] Primary Care Cartina Brousseau: [name] Other Clinician: Referring Sie Formisano: Treating Analissa Bayless/Extender: [name] in Treatment: 112 Active Problems Location of Pain Severity and Description of Pain Patient Has Paino No Site Locations Rate the pain. Current Pain Level: 0 Pain Management and Medication Current Pain Management: Electronic Signature(s) Signed: 12/22/2021 5:41:34 PM By: Baruch Gouty RN, BSN Entered By: Baruch Gouty on 12/22/2021 08:33:27 -------------------------------------------------------------------------------- Patient/Caregiver Education Details Patient Name: Date of Service: [name], [name] 9/20/2023andnbsp8:15 [place] Record Number: [id number] Patient Account Number: [id number] Date of Birth/Gender: Treating RN: 1985-05-11 (36 y.o. [name] Primary Care Physician: [name] Other Clinician: Referring Physician: Treating Physician/Extender: [name] in Treatment: 112 Education Assessment Education Provided To: Patient Education Topics Provided Elevated Blood Sugar/ Impact on Healing: Methods: Explain/Verbal Responses: Reinforcements needed, State content correctly Offloading: Methods: Explain/Verbal Responses: Reinforcements needed, State content correctly Wound/Skin Impairment: Methods: Explain/Verbal Responses: Reinforcements needed, State content correctly Electronic Signature(s) Signed: 12/22/2021 5:41:34 PM By: Baruch Gouty RN, BSN Entered By: Baruch Gouty on 12/22/2021 08:53:06 -------------------------------------------------------------------------------- Wound Assessment Details Patient Name: Date of Service: [name], [name] 12/22/2021 8:15 A M Medical Record Number: [id number] Patient  Account Number: [id number] Date of Birth/Sex: Treating RN: [birth year] (36 y.o. [name] Primary Care Haivyn Oravec: [name] Other Clinician: Referring Kenzi Bardwell: Treating Zariel Capano/Extender: [name] in Treatment: 112 Wound Status Wound Number: 3 Primary Etiology: Diabetic Wound/Ulcer of the Lower Extremity Wound Location: Left, Lateral, Plantar Foot Wound Status: Open Wounding Event: Trauma Comorbid History: Type II Diabetes Date Acquired: 10/02/2019 Weeks Of Treatment: 112 Clustered Wound: No Photos Wound Measurements Length: (cm) 3.4 Width: (cm) 4.1 Depth: (cm) 0.5 Area: (cm) 10.948 Volume: (cm) 5.474 % Reduction in Area: -563.9% % Reduction in Volume: -3217.6% Epithelialization: Small (1-33%) Tunneling: No Undermining: No Wound Description Classification: Grade 2 Wound Margin: Well defined, not attached Exudate Amount: Medium Exudate Type: Serosanguineous Exudate Color: red, brown Foul Odor After Cleansing: No Slough/Fibrino No Wound Bed Granulation Amount: Large (67-100%) Exposed Structure Granulation Quality: Red, Hyper-granulation Fascia Exposed: No Necrotic Amount: Small (1-33%) Fat Layer (Subcutaneous Tissue) Exposed: Yes Necrotic Quality: Adherent Slough Tendon Exposed: No Muscle Exposed: No Joint Exposed: No Bone Exposed: No Treatment Notes Wound #3 (Foot) Wound Laterality: Plantar, Left, Lateral Cleanser Soap and Water Discharge Instruction: May shower and wash wound with dial antibacterial soap and water prior to dressing change. Peri-Wound Care Zinc Oxide Ointment 30g tube Discharge Instruction: Apply Zinc Oxide to periwound with each dressing change Sween Lotion (Moisturizing lotion) Discharge Instruction: Apply moisturizing lotion as directed Topical Primary Dressing Promogran Prisma Matrix, 4.34 (sq in) (silver collagen) Discharge Instruction: Moisten collagen with saline or hydrogel Secondary  Dressing ABD Pad, 8x10 Discharge Instruction: Apply over primary dressing as directed. Optifoam Non-Adhesive Dressing, 4x4 in Discharge Instruction: Apply over primary dressing as directed. NonWoven Sponge, 4x4 (in/in) Secured With 16M Medipore H Soft Cloth Surgical T ape, 4 x 10 (in/yd) Discharge Instruction: Secure with tape as directed. Compression Wrap Kerlix Roll 4.5x3.1 (in/yd) Discharge Instruction: Apply Kerlix and Coban compression as directed. 16M ACE Elastic Bandage With VELCRO Brand Closure, 4 (in) Compression Stockings Add-Ons Electronic Signature(s) Signed: 12/22/2021 5:41:34 PM By: Baruch Gouty RN, BSN Entered By: Baruch Gouty on 12/22/2021 08:49:31 -------------------------------------------------------------------------------- Wound Assessment Details Patient Name: Date of Service: [name], [name] 12/22/2021 8:15 A M  Medical Record Number: [id number] Patient Account Number: [id number] Date of Birth/Sex: Treating RN: [birth year] (36 y.o. [name] Primary Care Tamas Suen: [name] Other Clinician: Referring [place] Yeats: Treating Nancie Bocanegra/Extender: [name] in Treatment: 112 Wound Status Wound Number: 8 Primary Etiology: Diabetic Wound/Ulcer of the Lower Extremity Wound Location: Right, Lateral Foot Wound Status: Open Wounding Event: Blister Comorbid History: Type II Diabetes Date Acquired: 04/18/2021 Weeks Of Treatment: 35 Clustered Wound: No Photos Wound Measurements Length: (cm) 2.1 Width: (cm) 2.2 Depth: (cm) 0.9 Area: (cm) 3.629 Volume: (cm) 3.266 % Reduction in Area: -559.8% % Reduction in Volume: -2869.1% Epithelialization: Small (1-33%) Tunneling: No Undermining: Yes Starting Position (o'clock): 9 Ending Position (o'clock): 2 Maximum Distance: (cm) 1 Wound Description Classification: Grade 2 Wound Margin: Well defined, not attached Exudate Amount: Medium Exudate Type:  Serosanguineous Exudate Color: red, brown Foul Odor After Cleansing: No Slough/Fibrino No Wound Bed Granulation Amount: Large (67-100%) Exposed Structure Granulation Quality: Red, Pink Fascia Exposed: No Necrotic Amount: Small (1-33%) Fat Layer (Subcutaneous Tissue) Exposed: Yes Necrotic Quality: Adherent Slough Tendon Exposed: No Muscle Exposed: No Joint Exposed: No Bone Exposed: No Treatment Notes Wound #8 (Foot) Wound Laterality: Right, Lateral Cleanser Soap and Water Discharge Instruction: May shower and wash wound with dial antibacterial soap and water prior to dressing change. Peri-Wound Care Sween Lotion (Moisturizing lotion) Discharge Instruction: Apply moisturizing lotion as directed Topical Primary Dressing Promogran Prisma Matrix, 4.34 (sq in) (silver collagen) Discharge Instruction: Moisten collagen with saline or hydrogel Secondary Dressing ABD Pad, 8x10 Discharge Instruction: Apply over primary dressing as directed. Optifoam Non-Adhesive Dressing, 4x4 in Discharge Instruction: Apply over primary dressing as directed. NonWoven Sponge, 4x4 (in/in) Secured With 75M Medipore H Soft Cloth Surgical T ape, 4 x 10 (in/yd) Discharge Instruction: Secure with tape as directed. Compression Wrap Kerlix Roll 4.5x3.1 (in/yd) Discharge Instruction: Apply Kerlix and Coban compression as directed. 75M ACE Elastic Bandage With VELCRO Brand Closure, 4 (in) Compression Stockings Add-Ons Electronic Signature(s) Signed: 12/22/2021 5:41:34 PM By: Baruch Gouty RN, BSN Entered By: Baruch Gouty on 12/22/2021 08:50:00 -------------------------------------------------------------------------------- Vitals Details Patient Name: Date of Service: [name], [name] 12/22/2021 8:15 A M Medical Record Number: [id number] Patient Account Number: [id number] Date of Birth/Sex: Treating RN: [birth year] (36 y.o. [name] Primary Care Ana Liaw: [name] Other  Clinician: Referring Josiyah Tozzi: Treating Anwar Crill/Extender: [name] in Treatment: 112 Vital Signs Time Taken: 08:22 Temperature (F): 98.6 Height (in): 77 Pulse (bpm): 88 Weight (lbs): 280 Respiratory Rate (breaths/min): 18 Body Mass Index (BMI): 33.2 Blood Pressure (mmHg): 156/96 Capillary Blood Glucose (mg/dl): 79 Reference Range: 80 - 120 mg / dl Notes glucose per pt report this am Electronic Signature(s) Signed: 12/22/2021 5:41:34 PM By: Baruch Gouty RN, BSN Entered By: Baruch Gouty on 12/22/2021 08:33:13

## 2021-12-22 NOTE — Progress Notes (Signed)
[name] ([id number]) Visit Report for 12/22/2021 Chief Complaint Document Details Patient Name: Date of Service: [name] D 12/22/2021 8:15 A M Medical Record Number: [id number] Patient Account Number: [id number] Date of Birth/Sex: Treating RN: Jan 08, 1986 (36 y.o. M) Primary Care Provider: [name] Other Clinician: Referring Provider: Treating Provider/Extender: [name] in Treatment: [place] from: Patient Chief Complaint 01/11/2019; patient is here for review of a rather substantial wound over the left fifth plantar metatarsal head extending into the lateral part of his foot 10/24/2019; patient returns to clinic with wounds on his bilateral feet with underlying osteomyelitis biopsy-proven Electronic Signature(s) Signed: 12/22/2021 9:11:43 AM By: [name] MD FACS Entered By: [name] on 12/22/2021 09:11:43 -------------------------------------------------------------------------------- Debridement Details Patient Name: Date of Service: [name], [name] 12/22/2021 8:15 A M Medical Record Number: [id number] Patient Account Number: [id number] Date of Birth/Sex: Treating RN: 10-10-1985 (36 y.o. [name] Primary Care Provider: [name] Other Clinician: Referring Provider: Treating Provider/Extender: [name] in Treatment: 112 Debridement Performed for Assessment: Wound #8 Right,Lateral Foot Performed By: Physician [name], MD Debridement Type: Debridement Severity of Tissue Pre Debridement: Fat layer exposed Level of Consciousness (Pre-procedure): Awake and Alert Pre-procedure Verification/Time Out Yes - 08:55 Taken: Start Time: 08:55 Pain Control: Lidocaine 4% T opical Solution T Area Debrided (L x W): otal 2.1 (cm) x 2.2 (cm) = 4.62 (cm) Tissue and other material debrided: Non-Viable, Callus, Slough, Slough Level: Non-Viable Tissue Debridement  Description: Selective/Open Wound Instrument: Curette Bleeding: Minimum Hemostasis Achieved: Pressure Procedural Pain: 0 Post Procedural Pain: 0 Response to Treatment: Procedure was tolerated well Level of Consciousness (Post- Awake and Alert procedure): Post Debridement Measurements of Total Wound Length: (cm) 2.1 Width: (cm) 2.2 Depth: (cm) 0.9 Volume: (cm) 3.266 Character of Wound/Ulcer Post Debridement: Improved Severity of Tissue Post Debridement: Fat layer exposed Post Procedure Diagnosis Same as Pre-procedure Electronic Signature(s) Signed: 12/22/2021 12:13:04 PM By: [name] MD FACS Signed: 12/22/2021 5:41:34 PM By: Baruch Gouty RN, BSN Entered By: Baruch Gouty on 12/22/2021 08:57:17 -------------------------------------------------------------------------------- Debridement Details Patient Name: Date of Service: [name], [name] 12/22/2021 8:15 A M Medical Record Number: [id number] Patient Account Number: [id number] Date of Birth/Sex: Treating RN: 1985/07/23 (36 y.o. [name] Primary Care Provider: [name] Other Clinician: Referring Provider: Treating Provider/Extender: [name] in Treatment: 112 Debridement Performed for Assessment: Wound #3 Left,Lateral,Plantar Foot Performed By: Physician [name], MD Debridement Type: Debridement Severity of Tissue Pre Debridement: Fat layer exposed Level of Consciousness (Pre-procedure): Awake and Alert Pre-procedure Verification/Time Out Yes - 08:55 Taken: Start Time: 08:55 Pain Control: Lidocaine 4% Topical Solution T Area Debrided (L x W): otal 4 (cm) x 5 (cm) = 20 (cm) Tissue and other material debrided: Non-Viable, Callus, Skin: Epidermis Level: Skin/Epidermis Debridement Description: Selective/Open Wound Instrument: Curette Bleeding: Minimum Hemostasis Achieved: Pressure Procedural Pain: 0 Post Procedural Pain: 0 Response to Treatment:  Procedure was tolerated well Level of Consciousness (Post- Awake and Alert procedure): Post Debridement Measurements of Total Wound Length: (cm) 3.4 Width: (cm) 4.1 Depth: (cm) 0.5 Volume: (cm) 5.474 Character of Wound/Ulcer Post Debridement: Improved Severity of Tissue Post Debridement: Fat layer exposed Post Procedure Diagnosis Same as Pre-procedure Notes scribed by Baruch Gouty, RN for Dr. [name] Electronic Signature(s) Signed: 12/22/2021 12:13:04 PM By: [name] MD FACS Signed: 12/22/2021 5:41:34 PM By: Baruch Gouty RN, BSN Entered By: Baruch Gouty on 12/22/2021 08:59:31 -------------------------------------------------------------------------------- HPI Details Patient Name: Date of  Service: [name] D 12/22/2021 8:15 A M Medical Record Number: [id number] Patient Account Number: [id number] Date of Birth/Sex: Treating RN: 03/08/1986 (36 y.o. 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 He comes in for review of a wound over the plantar fifth metatarsal head extending into the lateral part of the foot. He was followed for this previously by his podiatrist Dr. Cornelius [name]. As the patient tells his story he went to see podiatry first for a swelling he developed on the lateral part of his fifth metatarsal head in May. He states this was "open" by podiatry and the area closed. He was followed up in June and it was again opened callus removed and it closed promptly. There were plans being made for surgery on the fifth metatarsal head in June however his blood sugar was apparently too high for anesthesia. Apparently the area was debrided and opened again in June and it is never closed since. Looking over  the records from podiatry I am really not able to follow this. It was clear when he was first seen it was before 5/14 at that point he already had a wound. By 5/17 the ulcer was resolved. I do not see anything about a procedure. On 5/28 noted to have pre-ulcerative moderate keratosis. X-ray noted 1/5 contracted toe and tailor's bunion and metatarsal deformity. On a visit date on 09/28/2018 the dorsal part of the left foot it healed and resolved. There was concern about swelling in his lower extremity he was sent to the ER.. As far as I can tell he was seen in the ER on 7/12 with an ulcer on his left foot. A DVT rule out of the left leg was negative. I do not think I have complete records from podiatry but I am not able to verify the procedures this patient states he had. He states after the last procedure the wound has never closed although I am not able to follow this in the records I have from podiatry. He has not had a recent x-ray The patient has been using Neosporin on the wound. He is wearing a Darco shoe. He is still very active up on his foot working and exercising. Past medical history; type 2 diabetes ketosis-prone, leg swelling with a negative DVT study in July. Non-smoker ABI in our clinic was 0.85 on the left 10/16; substantial wound on the plantar left fifth met head extending laterally almost to the dorsal fifth MTP. We have been using silver alginate we gave him a Darco forefoot off loader. An x-ray did not show evidence of osteomyelitis did note soft tissue emphysema which I think was due to gas tracking through an open wound. There is no doubt in my mind he requires an MRI 10/23; MRI not booked until 3 November at the earliest this is largely due to his glucose sensor in the right arm. We have been using silver alginate. There has been an improvement 10/29; I am still not exactly sure when his MRI is booked for. He says it is the third but it is the 10th in epic. This definitely needs  to be done. He is running a low-grade fever today but no other symptoms. No real improvement in the 1 02/26/2019 patient presents today for a follow-up visit here in  our clinic he is last been seen in the clinic on October 29. Subsequently we were working on getting MRI to evaluate and see what exactly was going on and where we would need to go from the standpoint of whether or not he had osteomyelitis and again what treatments were going be required. Subsequently the patient ended up being admitted to the hospital on 02/07/2019 and was discharged on 02/14/2019. This is a somewhat interesting admission with a discharge diagnosis of pneumonia due to COVID-19 although he was positive for COVID-19 when tested at the urgent care but negative x2 when he was actually in the hospital. With that being said he did have acute respiratory failure with hypoxia and it was noted he also have a left foot ulceration with osteomyelitis. With that being said he did require oxygen for his pneumonia and I level 4 L. He was placed on antivirals and steroids for the COVID-19. He was also transferred to the [facility] at one point. Nonetheless he did subsequently discharged home and since being home has done much better in that regard. The CT angiogram did not show any pulmonary embolism. With regard to the osteomyelitis the patient was placed on vancomycin and Zosyn while in the hospital but has been changed to Augmentin at discharge. It was also recommended that he follow- up with wound care and podiatry. Podiatry however wanted him to see [name] according to the patient prior to them doing anything further. His hemoglobin A1c was 9.9 as noted in the hospital. Have an MRI of the left foot performed while in the hospital on 02/04/2019. 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 There is an overlying plantar open wound noted an  abscess tracking back along the lateral aspect of the fifth metatarsal shaft. There is otherwise diffuse cellulitis and mild fasciitis without findings of polymyositis. The patient did have recently pneumonia secondary to COVID-19 I looked in the chart through epic and it does appear that the patient may need to have an additional x-ray just to ensure everything is cleared and that he has no airspace disease prior to putting him into the [name]. 03/05/2019; patient was readmitted to the clinic last week. He was hospitalized twice for a viral upper respiratory tract infection from 11/1 through 11/4 and then 11/5 through 11/12 ultimately this turned out to be Covid pneumonitis. Although he was discharged on oxygen he is not using it. He says he feels fine. He has no exercise limitation no cough no sputum. His O2 sat in our clinic today was 100% on room air. 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 He received Vanco and Zosyn in the hospital and then was discharged on 2 weeks of Augmentin. I do not see any relevant cultures. He was supposed to follow-up with infectious disease but I do not see that he has an appointment. 12/8; patient saw Dr. Novella Olive of infectious disease last week. He felt that he had had adequate antibiotic therapy. He did not go to follow-up with Dr. Amalia Hailey of podiatry and I have again talked to him about the pros and cons of this. He does not want to consider a ray amputation of this time. He is aware of the risks of recurrence, migration etc. He started HBO today and tolerated this well. He can complete  the Augmentin that I gave him last week. I have looked over the lab work that Dr. Chana Bode ordered his C-reactive protein was 3.3 and his sedimentation rate was 17. The C-reactive protein is never really been measurably that high in this patient 12/15; not much change in  the wound today however he has undermining along the lateral part of the foot again more extensively than last week. He has some rims of epithelialization. We have been using silver alginate. He is undergoing hyperbarics but did not dive today 12/18; in for his obligatory first total contact cast change. Unfortunately there was pus coming from the undermining area around his fifth metatarsal head. This was cultured but will preclude reapplication of a cast. He is seen in conjunction with HBO 12/24; patient had staph lugdunensis in the wound in the undermining area laterally last time. We put him on doxycycline which should have covered this. The wound looks better today. I am going to give him another week of doxycycline before reattempting the total contact cast 12/31; the patient is completing antibiotics. Hemorrhagic debris in the distal part of the wound with some undermining distally. He also had hyper granulation. Extensive debridement with a #5 curette. The infected area that was on the lateral part of the fifth met head is closed over. I do not think he needs any more antibiotics. Patient was seen prior to HBO. Preparations for a total contact cast were made in the cast will be placed post hyperbarics 04/11/19; once again the patient arrives today without complaint. He had been in a cast all week noted that he had heavy drainage this week. This resulted in large raised areas of macerated tissue around the wound 1/14; wound bed looks better slightly smaller. Hydrofera Blue has been changing himself. He had a heavy drainage last week which caused a lot of maceration around the wound so I took him out of a total contact cast he says the drainage is actually better this week He is seen today in conjunction with HBO 1/21; returns to clinic. He was up in [place] for a day or 2 attending a funeral. He comes back in with the wound larger and with a large area of exposed bone. He had osteomyelitis and  septic arthritis of the fifth left metatarsal head while he was in hospital. He received IV antibiotics in the hospital for a prolonged period of time then 3 weeks of Augmentin. Subsequently I gave him 2 weeks of doxycycline for more superficial wound infection. When I saw this last week the wound was smaller the surface of the wound looks satisfactory. 1/28; patient missed hyperbarics today. Bone biopsy I did last time showed Enterococcus faecalis and Staphylococcus lugdunensis . He has a wide area of exposed bone. We are going to use silver alginate as of today. I had another ethical discussion with the patient. 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 In this situation I think the likelihood of healing this is low. Therefore I have recommended a ray amputation and with the patient's agreement I have referred him to Dr. Doran Durand. The other issue is that his compliance with hyperbarics has been minimal because of his work schedule and given his underlying decision I am going to stop this today READMISSION 10/24/2019 MRI 09/29/2019 left foot IMPRESSION: 1. Apparent skin ulceration inferior and lateral to the 5th metatarsal base with underlying heterogeneous T2 signal and enhancement in the subcutaneous fat. Small peripherally enhancing fluid collections along the plantar  and lateral aspects of the 5th metatarsal base suspicious for abscesses. 2. Interval amputation through the mid 5th metatarsal with nonspecific low-level marrow edema and enhancement. Given the proximity to the adjacent soft tissue inflammatory changes, osteomyelitis cannot be excluded. 3. The additional bones appear unremarkable. MRI 09/29/2019 right foot IMPRESSION: 1. Soft tissue ulceration lateral to the 5th MTP joint. There is low-level T2 hyperintensity within the 4th and 5th metatarsal heads and adjacent proximal phalanges without abnormal T1 signal or cortical destruction. These  findings are nonspecific and could be seen with early marrow edema, hyperemia or early osteomyelitis. No evidence of septic joint. 2. Mild tenosynovitis and synovial enhancement associated with the extensor digitorum tendons at the level of the midfoot. 3. Diffuse low-level muscular T2 hyperintensity and enhancement, most consistent with diabetic myopathy. LEFT FOOT BONE Methicillin resistant staphylococcus aureus Staphylococcus lugdunensis MIC MIC CIPROFLOXACIN >=8 RESISTANT Resistant <=0.5 SENSI... Sensitive CLINDAMYCIN <=0.25 SENS... Sensitive >=8 RESISTANT Resistant ERYTHROMYCIN >=8 RESISTANT Resistant >=8 RESISTANT Resistant GENTAMICIN <=0.5 SENSI... Sensitive <=0.5 SENSI... Sensitive Inducible Clindamycin NEGATIVE Sensitive NEGATIVE Sensitive OXACILLIN >=4 RESISTANT Resistant 2 SENSITIVE Sensitive RIFAMPIN <=0.5 SENSI... Sensitive <=0.5 SENSI... Sensitive TETRACYCLINE <=1 SENSITIVE Sensitive <=1 SENSITIVE Sensitive TRIMETH/SULFA <=10 SENSIT Sensitive <=10 SENSIT Sensitive ... Marland Kitchen.. VANCOMYCIN 1 SENSITIVE Sensitive <=0.5 SENSI... Sensitive Right foot bone . Component 3 wk ago Specimen Description BONE Special Requests RIGHT 4 METATARSAL SAMPLE B Gram Stain NO WBC SEEN NO ORGANISMS SEEN Culture RARE METHICILLIN RESISTANT STAPHYLOCOCCUS AUREUS NO ANAEROBES ISOLATED Performed at [facility], [place] [address] [place], [place] [address] Report Status 10/08/2019 FINAL Organism ID, Bacteria METHICILLIN RESISTANT STAPHYLOCOCCUS AUREUS Resulting Agency [facility] Susceptibility Methicillin resistant staphylococcus aureus MIC CIPROFLOXACIN >=8 RESISTANT Resistant CLINDAMYCIN <=0.25 SENS... Sensitive ERYTHROMYCIN >=8 RESISTANT Resistant GENTAMICIN <=0.5 SENSI... Sensitive Inducible Clindamycin NEGATIVE Sensitive OXACILLIN >=4 RESISTANT Resistant RIFAMPIN <=0.5 SENSI... Sensitive TETRACYCLINE <=1 SENSITIVE Sensitive TRIMETH/SULFA <=10 SENSIT Sensitive ... VANCOMYCIN 1  SENSITIVE Sensitive This is a patient we had in clinic earlier this year with a wound over his left fifth metatarsal head. 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 Doran Durand on 05/16/2019. He seems to have developed problems with wounds on his bilateral feet in June 2021 although he may have had problems earlier than this. He was in an urgent care with a right foot ulcer on 09/26/2019 and given a course of doxycycline. This was apparently after having trouble getting into see orthopedics. He was seen by podiatry on 09/28/2019 noted to have bilateral lower extremity ulcers including the left lateral fifth metatarsal base and the right subfifth met head. It was noted that had purulent drainage at that time. He required hospitalization from 6/20 through 7/2. This was because of worsening right foot wounds. He underwent bilateral operative incision and drainage and bone biopsies bilaterally. Culture results are listed above. He has been referred back to clinic by Dr. Jacqualyn Posey of podiatry. He is also followed by Dr. Megan Salon who saw him yesterday. He was discharged from hospital on Zyvox Flagyl and Levaquin and yesterday changed to doxycycline Flagyl and Levaquin. His inflammatory markers on 6/26 showed a sedimentation rate of 129 and a C-reactive protein of 5. This is improved to 14 and 1.3 respectively. This would indicate improvement. ABIs in our clinic  today were 1.23 on the right and 1.20 on the left 11/01/2019 on evaluation today patient appears to be doing fairly well in regard to the wounds on his feet at this point. Fortunately there is no signs of active infection at this time. No fevers, chills, nausea, vomiting, or  diarrhea. He currently is seeing infectious disease and still under their care at this point. Subsequently he also has both wounds which she has not been using collagen on as he did not receive that in his packaging he did not call us and let us know that. Apparently that just was missed on the order. Nonetheless we will get that straightened out today. 8/9-Patient returns for bilateral foot wounds, using Prisma with hydrogel moistened dressings, and the wounds appear stable. Patient using surgical shoes, avoiding much pressure or weightbearing as much as possible 8/16; patient has bilateral foot wounds. 1 on the right lateral foot proximally the other is on the left mid lateral foot. Both required debridement of callus and thick skin around the wounds. We have been using silver collagen 8/27; patient has bilateral lateral foot wounds. The area on the left substantially surrounded by callus and dry skin. This was removed from the wound edge. The underlying wound is small. The area on the right measured somewhat smaller today. 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 Unfortunately I did not update his antibiotics during today's visit. 9/10 I reviewed Dr. Hale Bogus last notes he felt he had completed antibiotics his inflammatory markers were reasonably well controlled. He has a small wound on the lateral left foot and a tiny area on the right which is just above closed. He is using Hydrofera Blue with border foam he has bilateral surgical shoes 9/24; 2 week f/u. doing well. right foot is closed. left foot still undermined. 10/14; right foot remains closed at the fifth met head. The area over the base of the left fifth metatarsal has a small open area but considerable undermining towards the plantar foot. Thick callus skin around this suggests an adequate pressure relief. We have talked about this. He says he is going to go back into  his cam boot. I suggested a total contact cast he did not seem enamored with this suggestion 10/26; left foot base of the fifth metatarsal. Same condition as last time. He has skin over the area with an open wound however the skin is not adherent. He went to see Dr. Earleen Newport who did an x-ray and culture of his foot I have not reviewed the x-ray but the patient was not told anything. He is on doxycycline 11/11; since the patient was last here he was in the emergency room on 10/30 he was concerned about swelling in the left foot. They did not do any cultures or x-rays. They changed his antibiotics to cephalexin. Previous culture showed group B strep. The cephalexin is appropriate as doxycycline has less than predictable coverage. Arrives in clinic today with swelling over this area under the wound. He also has a new wound on the right fifth metatarsal head 11/18; the patient has a difficult wound on the lateral aspect of the left fifth metatarsal head. The wound was almost ballotable last week I opened it slightly expecting to see purulence however there was just bleeding. I cultured this this was negative. X-ray unchanged. We are trying to get an MRI but I am not sure were going to be able to get this through  his insurance. He also has an area on the right lateral fifth metatarsal head this looks healthier 12/3; the patient finally got our MRI. Surprisingly this did not show osteomyelitis. I did show the soft tissue ulceration at the lateral plantar aspect of the fifth metatarsal base with a tiny residual 6 mm abscess overlying the superficial fascia I have tried to culture this area I have not been able to get this to grow anything. Nevertheless the protruding tissue looks aggravated. I suspect we should try to treat the underlying "abscess with broad-spectrum antibiotics. I am going to start him on Levaquin and Flagyl. He has much less edema in his legs and I am going to continue to wrap his legs and  see him weekly 12/10. I started Levaquin and Flagyl on him last week. He just picked up the Flagyl apparently there was some delay. The worry is the wound on the left fifth metatarsal base which is substantial and worsening. His foot looks like he inverts at the ankle making this a weightbearing surface. Certainly no improvement in fact I think the measurements of this are somewhat worse. We have been using 12/17; he apparently just got the Levaquin yesterday this is 2 weeks after the fact. He has completed the Flagyl. The area over the left fifth metatarsal base still has protruding granulation tissue although it does not look quite as bad as it did some weeks ago. He has severe bilateral lymphedema although we have not been treating him for wounds on his legs this is definitely going to require compression. There was so much edema in the left I did not wish to put him in a total contact cast today. I am going to increase his compression from 3-4 layer. The area on the right lateral fifth met head actually look quite good and superficial. 12/23; patient arrived with callus on the right fifth met head and the substantial hyper granulated callused wound on the base of his fifth metatarsal. He says he is completing his Levaquin in 2 days but I do not think that adds up with what I gave him but I will have to double check this. We are using Hydrofera Blue on both areas. My plan is to put the left leg in a cast the week after New Year's 04/06/2020; patient's wounds about the same. Right lateral fifth metatarsal head and left lateral foot over the base of the fifth metatarsal. There is undermining on the left lateral foot which I removed before application of total contact cast continuing with Hydrofera Blue new. Patient tells me he was seen by endocrinology today lab work was done [Dr. Kerr]. Also wondering whether he was referred to cardiology. I went over some lab work from previously does not have  chronic renal failure certainly not nephrotic range proteinuria he does have very poorly controlled diabetes but this is not his most updated lab work. Hemoglobin A1c has been over 11 1/10; the patient had a considerable amount of leakage towards mid part of his left foot with macerated skin however the wound surface looks better the area on the right lateral fifth met head is better as well. I am going to change the dressing on the left foot under the total contact cast to silver alginate, continue with Hydrofera Blue on the right. 1/20; patient was in the total contact cast for 10 days. Considerable amount of drainage although the skin around the wound does not look too bad on the left foot. The area on  the right fifth metatarsal head is closed. Our nursing staff reports large amount of drainage out of the left lateral foot wound 1/25; continues with copious amounts of drainage described by our intake staff. PCR culture I did last week showed E. coli and Enterococcus faecalis and low quantities. Multiple resistance genes documented including extended spectrum beta lactamase, MRSA, MRSE, quinolone, tetracycline. The wound is not quite as good this week as it was 5 days ago but about the same size 2/3; continues with copious amounts of malodorous drainage per our intake nurse. The PCR culture I did 2 weeks ago showed E. coli and low quantities of Enterococcus. There were multiple resistance genes detected. I put Neosporin on him last week although this does not seem to have helped. The wound is slightly deeper today. Offloading continues to be an issue here although with the amount of drainage she has a total contact cast is just not going to work 2/10; moderate amount of drainage. Patient reports he cannot get his stocking on over the dressing. I told him we have to do that the nurse gave him suggestions on how to make this work. The wound is on the bottom and lateral part of his left foot. Is  cultured predominantly grew low amounts of Enterococcus, E. coli and anaerobes. There were multiple resistance genes detected including extended spectrum beta lactamase, quinolone, tetracycline. I could not think of an easy oral combination to address this so for now I am going to do topical antibiotics provided by [facility] I think the main agents here are vancomycin and an aminoglycoside. We have to be able to give him access to the wounds to get the topical antibiotic on 2/17; moderate amount of drainage this is unchanged. He has his Keystone topical antibiotic against the deep tissue culture organisms. He has been using this and changing the dressing daily. Silver alginate on the wound surface. 2/24; using Keystone antibiotic with silver alginate on the top. He had too much drainage for a total contact cast at one point although I think that is improving and I think in the next week or 2 it might be possible to replace a total contact cast I did not do this today. In general the wound surface looks healthy however he continues to have thick rims of skin and subcutaneous tissue around the wide area of the circumference which I debrided 06/04/2020 upon evaluation today patient appears to be doing well in regard to his wound. I do feel like he is showing signs of improvement. There is little bit of callus and dead tissue around the edges of the wound as well as what appears to be a little bit of a sinus tract that is off to the side laterally I would perform debridement to clear that away today. 3/17; left lateral foot. The wound looks about the same as I remember. Not much depth surface looks healthy. No evidence of infection 3/25; left lateral foot. Wound surface looks about the same. Separating epithelium from the circumference. There really is no evidence of infection here however not making progress by my view 3/29; left lateral foot. Surface of the wound again looks reasonably healthy still thick  skin and subcutaneous tissue around the wound margins. There is no evidence of infection. One of the concerns being brought up by the nurses has again the amount of drainage vis--vis continued use of a total contact cast 4/5; left lateral foot at roughly the base of the fifth metatarsal. Nice healthy looking  granulated tissue with rims of epithelialization. The overall wound measurements are not any better but the tissue looks healthy. The only concern is the amount of drainage although he has no surrounding maceration with what we have been doing recently to absorb fluid and protect his skin. He also has lymphedema. 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 He states that the kerlix/coban under the TCC rolled down and could not get it back up. He has been using an offloading scooter and has somehow hurt his right foot using this device. This happened last week. He states that the side of his right foot developed a blister and opened. The top of his foot also has a few small open wounds he thinks is due to his socks rubbing in his shoes. He has not been using any dressings to the wound. He denies purulent drainage, fever/chills or erythema to the wounds. 4/22; patient presents for 1 week follow-up. He developed new wounds to the right foot that were evaluated at last clinic visit. He continues to have a total contact cast to the left leg and he reports no issues. He has been using silver collagen to the right foot wounds with no issues. He denies purulent drainage, fever/chills or erythema to the right foot wounds. He has no complaints today 4/25; patient presents for 1 week follow-up. He has a total contact cast of the left leg and reports no issues. He  has been using silver alginate to the right foot wound. He denies purulent drainage, fever/chills or erythema to the right foot wounds. 5/2 patient presents for 1 week follow-up. T contact cast on the left. The wound which is on the base of the plantar foot at the base of the fifth metatarsal otal actually looks quite good and dimensions continue to gradually contract. HOWEVER the area on the right lateral fifth metatarsal head is much larger than what I remember from 2 weeks ago. Once more is he has significant levels of hypergranulation. Noteworthy that he had this same hyper granulated response on his wound on the left foot at one point in time. So much so that he I thought there was an underlying fluid collection. Based on this I think this just needs debridement. 5/9; the wound on the left actually continues to be gradually smaller with a healthy surface. Slight amount of drainage and maceration of the skin around but not too bad. However he has a large wound over the right fifth metatarsal head very much in the same configuration as his left foot wound was initially. I used silver nitrate to address the hyper granulated tissue no mechanical debridement 5/16; area on the left foot did not look as healthy this week deeper thick surrounding macerated skin and subcutaneous tissue. The area on the right foot fifth met head was about the same The area on the right ankle that we identified last week is completely broken down into an open wound presumably a stocking rubbing issue 5/23; patient has been using a total contact cast to the left side. He has been using silver alginate underneath. He has also been using silver alginate to the right foot wounds. He has no complaints today. He denies any signs of infection. 5/31; the  left-sided wound looks some better measure smaller surface granulation looks better. We have been using silver alginate under the total contact cast The large area on his right  fifth met head and right dorsal foot look about the same still using silver alginate 6/6; neither side is good as I was hoping although the surface area dimensions are better. A lot of maceration on his left and right foot around the wound edge. Area on the dorsal right foot looks better. He says he was traveling. I am not sure what does the amount of maceration around the plantar wounds may be drainage issues 6/13; in general the wound surfaces look quite good on both sides. Macerated skin and raised edges around the wound required debridement although in general especially on the left the surface area seems improved. The area on the right dorsal ankle is about the same I thought this would not be such a problem to close 6/20; not much change in either wound although the one on the right looks a little better. Both wounds have thick macerated edges to the skin requiring debridements. We have been using silver alginate. The area on the dorsal right ankle is still open I thought this would be closed. 6/28; patient comes in today with a marked deterioration in the right foot wound fifth met head. Wide area of exposed bone this is a drastic change from last time. The area on the left there we have been casting is stagnant. We have been using silver alginate in both wound areas. 7/5; bone culture I did for PCR last time was positive for Pseudomonas, group B strep, Enterococcus and Staph aureus. There was no suggestion of methicillin resistance or ampicillin resistant genes. This was resistant to tetracycline however He comes into the clinic today with the area over his right plantar fifth metatarsal head which had been doing so well 2 weeks ago completely necrotic feeling bone. I do not know that this is going to be salvageable. The left foot wound is certainly no smaller but it has a better surface and is superficial. 7/8; patient called in this morning to say that his total contact cast was rubbing  against his foot. He states he is doing fine overall. He denies signs of infection. 7/12; continued deterioration in the wound over the right fifth metatarsal head crumbling bone. This is not going to be salvageable. The patient agrees and wants to be referred to Dr. Doran Durand which we will attempt to arrange as soon as possible. I am going to continue him on antibiotics as long as that takes so I will renew those today. The area on the left foot which is the base of the fifth metatarsal continues to look somewhat better. Healthy looking tissue no depth no debridement is necessary here. 7/20; the patient was kindly seen by Dr. Doran Durand of orthopedics on 10/19/2020. 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 Towards this end we have taken him out of the total contact cast on the left we will put him in a wrap with Hydrofera Blue. As I understand things surgery is planned for 7/21 7/27; patient had his surgery last Thursday. 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 Apparently everything went well we did not still disturb that today The area on the left foot actually looks quite good. He has been much less mobile which probably explains this he  did not seem to do well in the total contact cast secondary to drainage and maceration I think. 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 Fortunately there is no evidence of active infection at this time. No fevers, chills, nausea, vomiting, or diarrhea. Overall I think that he is actually doing extremely well. Nonetheless I do believe that he is staying off of this more following the surgery in his right foot that is the reason the left is doing so great. 8/16; left plantar foot wound. This looks smaller than the last time I saw this he is using Hydrofera Blue. The  surgical wound on the right foot is being followed by Dr. Doran Durand we did not look at this today. He has surgical shoes on both feet 8/23; left plantar foot wound not as good this week. Surrounding macerated skin and subcutaneous tissue everything looks moist and wet. I do not think he is offloading this adequately. He is using a surgical shoe Apparently the right foot surgical wound is not open although I did not check his foot 8/31; left plantar foot lateral aspect. Much improved this week. He has no maceration. Some improvement in the surface area of the wound but most impressively the depth is come in we are using silver alginate. The patient is a Product/process development scientist. He is asked that we write him a letter so he can go back to work. I have also tried to see if we can write something that will allow him to limit the amount of time that he is on his foot at work. 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 Hopefully the latter can be avoided 9/6; I believe the patient missed an appointment last week. He arrives in today with a wound looking roughly the same certainly no better. Undermining laterally and also inferiorly. We used molecuLight today in training with the patient's permission.. We are using silver alginate 9/21 wound is measuring bigger this week although this may have to do with the aggressive circumferential debridement last week in response to the blush fluorescence on the MolecuLight. Culture I did last week showed significant MSSA and E. coli. I put him on Augmentin but he has not started it yet. We are also going to send this for compounded antibiotics at [facility]. There is no evidence of systemic infection 9/29; silver alginate. His Keystone arrived. He is completing Augmentin in 2 days. Offloading in a cam boot. Moderate drainage per our intake staff 10/5; using silver alginate. He has been using his  [place]. He has completed his Augmentin. Per our intake nurse still a lot of drainage, far too much to consider a total contact cast. Wound measures about the same. He had the same undermining area that I defined last week from a roughly 11-3. I remove this today 10/12; using silver alginate he is using the [place]. He comes in for a nurse visit hence we are applying [employer] twice a week. Measuring slightly better today and less notable drainage. Extensive debridement of the wound edge last time 10/18; using topical Keystone and silver alginate and a soft cast. Wound measurements about the same. Drainage was through his soft cast. We are changing this twice a week Tuesdays and Friday 10/25; comes in with moderate drainage. Still using Keystone silver alginate and  a soft cast. Wound dimensions completely the same.He has a lot of edema in the left leg he has lymphedema. Asking for [name] to consider wrapping him as he cannot get his stocking on over the soft cast 11/2; comes in with moderate to large drainage slightly smaller in terms of width we have been using [place]. His wound looks satisfactory but not much improvement 11/4; patient presents today for obligatory cast change. Has no issues or complaints today. He denies signs of infection. 11/9; patient traveled this weekend to [place], was on the cast quite a bit. Staining of the cast with black material from his walking boot. Drainage was not quite as bad as we feared. Using silver alginate and Keystone 11/16; we do not have size for cast therefore we have been putting a soft cast on him since the change on Friday. Still a significant amount of drainage necessitating changing twice a week. We have been using the Keystone at cast changes either hard or soft as well as silver alginate Comes in the clinic with things actually looking fairly good improvement in width. He says his offloading is about the same 02/24/2021 upon evaluation today patient  actually comes back in and is doing excellent in regard to his foot ulcer this is significantly smaller even compared to the last visit. The soft cast seems to have done extremely well for him which is great news. I do not see any signs of infection minimal debridement will be needed today. 11/30; left lateral foot much improved half a centimeter improvement in surface area. No evidence of infection. He seems to be doing better with the soft cast in the TCC therefore we will continue with this. He comes back in later in the week for a change with the nurses. This is due to drainage 12/6; no improvement in dimensions. Under illumination some debris on the surface we have been using silver alginate, soft cast. If there is anything optimistic here he seems to have have less drainage 12/13. Dimensions are improved both length and width and slightly in depth. Appears to be quite healthy today. Raised edges of this thick skin and callus around the edges however. He is in a soft cast were bringing him back once for a change on Friday. Drainage is better 12/20. Dimensions are improved. He still has raised edges of thick skin and callus around the edges. We are using a soft cast 12/28; comes in today with thick callus around the wound. Using silver under alginate under a soft cast. I do not think there is much improvement in any measurement 2023 04/06/2021; patient was put in a total contact cast. Unfortunately not much change in surface area 1/10; not much different still thick callus and skin around the edge in spite of the total contact cast. This was just debrided last week we have been using the [facility] compounded antibiotic and silver alginate under a total contact cast 1/18 the patient's wound on the left side is doing nicely. smaller HOWEVER he comes in today with a wound on the right foot laterally. blister most likely serosangquenous drainage 1/24; the patient continues to do well in terms of the  plantar left foot which is continued to contract using silver alginate under the total contact cast HOWEVER the right lateral foot is bigger with denuded skin around the edges. I used pickups and a #15 scalpel to remove this this looks like the remanence of a large blister. Cannot rule out infection. Culture in this area  I did last week showed Staphylococcus lugdunensis few colonies. I am going to try to address this with his [employer] antibiotic that is done so well on the left having linezolid and this should cover the staph 2/1; the patient's wound on his left foot which was the original plantar foot wound thick skin and eschar around the edges even in the total contact cast but the wound surface does not look too bad The real problem is on how his right lateral foot at roughly the base of the fifth metatarsal. The wound is completely necrotic more worrisome than that there is swelling around the edges of this. We have been using silver alginate on both wounds and Keystone on the right foot. Unfortunately I think he is going to require systemic antibiotics while we await cultures. He did not get the x-ray done that we ordered last week [lost the prescription 2/7; disappointingly in the area on the left foot which we are treating with a total contact cast is still not closed although it is much smaller. He continues to have a lot of callus around the wound edge. -Right lateral foot culture I did last week was negative x-ray also negative for osteomyelitis. 2/15: TCC silver alginate on the left and silver alginate on the right lateral. No real improvement in either area 05/26/2021: T oday, the wounds are roughly the same size as at his previous visit, post-debridement. He continues to endorse fairly substantial drainage, particularly on the right. He has been in a total contact cast on the left. There is still some callus surrounding this lesion. On the right, the periwound skin is quite macerated,  along with surrounding callus. The center of the right-sided wound also has some dark, densely adherent material, which is very difficult to remove. 06/02/2021: Today, both wounds are slightly smaller. He has been using zinc oxide ointment around the right ulcer and the degree of maceration has improved markedly. There continues to be an area of nonviable tissue in the center of the right sided ulcer. The left-sided wound, which has been in the total contact cast. Appears clean and the degree of callus around it is less than previously. 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 He had to take the stairs and both wounds have increased in size. The left foot, which has been in a total contact cast, has developed a tunnel tracking to the lateral aspect of his foot. The nonviable tissue in the center of the right-sided ulcer remains recalcitrant to debridement. There is significant undermining surrounding the entirety of the left sided wound. 06/16/2021: The elevator at school has been fixed and the patient has been able to avoid putting as much weight on his wounds over the past week. We converted the left foot wound into a single lesion today, but despite this, the wound is actually smaller. The base is healthy with limited periwound callus. On the right, the central necrotic area is still present. He continues to be quite macerated around the right sided wound, despite applying barrier cream. This does, however, have the benefit of softening the callus to make it more easily removable. 06/23/2021: Today, the left wound is smaller. The lateral aspect that had opened up previously is now closed. The wound base has a healthy bed of granulation tissue and minimal slough. Unfortunately, on the right, the wound is larger and continues to be fairly macerated. He has also reopened the wound at his  right ankle. He thinks this is due to the gait he has  adopted secondary to his total contact cast and boot. 06/30/2021: T oday, both wounds are a little bit larger. The lateral aspect on the left has remained closed. He continues to have significant periwound maceration. The culture that I took from the right sided wound grew a population of bacteria that is not covered by his current [facility] antibiotic. The center of the right- sided wound continues to appear necrotic with nonviable fat. It probes deeper today, but does not reach bone. 07/07/2021: The periwound maceration is a little bit less today. The right lateral foot wound has some areas that appear more viable and the necrotic center also looks a little bit better. The wound on the dorsal surface of his right foot near the ankle is contracting and the surface appears healthy. The left plantar wound surface looks healthy, but there is some new undermining on the medial portion. He did get his new Keystone antibiotic and began applying that to the right foot wound on Saturday. 07/14/2021: The intake nurse reported substantial drainage from his wounds, but the periwound skin actually looks better than is typical for him. The wound on the dorsal surface of his right foot near the ankle is smaller and just has a small open area underneath some dried eschar. The left plantar wound surface looks healthy and there has been no significant accumulation of callus. The right lateral foot wound looks quite a bit better, with the central portion, which typically appears necrotic, looking more viable albeit pale. 07/22/2021: His left foot is extremely macerated today. The wound is about the same size. The wound on the dorsal surface of his right foot near the ankle had closed, but he traumatized it removing the dressing and there is a tiny skin tear in that location. The right lateral foot wound is bigger, but the surface appears healthy. 07/30/2021: The wound on the dorsal surface of his right foot near the ankle is  closed. The right lateral foot wound again is a little bit bigger due to some undermining. The periwound skin is in better condition, however. He has been applying zinc oxide. The wound surface is a little bit dry today. On the left, he does not have the substantial maceration that we frequently see. The wound itself is smaller and has a clean surface. 08/06/2021: Both wounds seem to have deteriorated over the past week. The right lateral foot wound has a dry surface but the periwound is boggy.. Overall wound dimensions are about the same. On the left, the wound is about the same size, but there is more undermining present underneath periwound callus. 08/13/2021: The right sided wound looks about the same, but on the left there has been substantial deterioration. The undermining continues to extend under periwound callus. Once this was removed, substantial extension of the wound was present. There is no odor or purulent drainage but clearly the wounds have broken down. 08/20/2021: The wounds look about the same today. He has been out of his total contact cast and has just been changing the dressings using topical Keystone with PolyMem Ag, Kerlix and Ace bandages. The wound on the top of his right ankle has reopened but this is quite small. There was a little bit of purulent material that I expressed when examining this wound. 08/24/2021: After the aggressive debridement I performed at his last visit, the wounds actually look a little bit better today. They are smaller with the exception  of the wound on the top of his right ankle which is a little bit bigger as some more skin pulled off when he was changing his dressing. We are using topical Keystone with PolyMem Ag Kerlix and Ace bandages. 09/02/2021: There has been really no change to any of his wounds. 09/16/2021: The patient was hospitalized last week with nausea, vomiting, and dehydration. He says that while he was in the hospital, his wounds were not  really addressed properly. T oday, both plantar foot wounds are larger and the periwound skin is macerated. The wound on the dorsum of his right foot has a scab on the top. The right foot now has a crater where previously he had had nonviable fat. It looks as though this simply died and fell out. The periwound callus is wet. 09/24/2021: His wounds have deteriorated somewhat since his last visit. The wound on the dorsum of his right foot near his ankle is larger and has more nonviable tissue present. The crater in his right foot is even deeper; I cannot quite palpate or probe to bone but I am sure it is close. The wound on his left plantar foot has an odd boggy area in the center that almost feels as though it has fluid within it. He has run out of his topical Keystone antibiotic. We are using silver alginate on his wounds. 09/29/2021: He has developed a new wound on the dorsum of his left foot near his ankle. He says he thinks his wrapping is rubbing in that site. I would concur with this as the wound on his right ankle is larger. The left foot looks about the same. The right foot has the crater that was present last week. No significant slough accumulation, but his foot remains quite swollen and warm despite oral antibiotic therapy. 10/08/2021: All of his wounds look about the same as last week. He did not start his oral antibiotics that are prescribed until just a couple of days ago; his [employer] compounded antibiotics formula has been changed and he is awaiting delivery of the new recipe. His MRI that was scheduled for earlier this week was canceled as no prior authorization had been obtained; unfortunately the tech responsible sent an email to my old [place] email, which I no longer use nor have access to. 10/18/2021: The wounds on his bilateral dorsal feet near the ankles are both improved. They are smaller and have just some eschar and slough buildup. The left plantar wound has a fair amount of  undermining, but the surface is clean. There is some periwound callus accumulation. On the right plantar foot, there is nonviable fat leading to a deep tunnel that tracks towards his dorsal medial foot. There is periwound callus and slough accumulation, as well. His right foot and leg remain swollen as compared to the left. 10/25/2021: The wounds on his bilateral dorsal feet and at the ankles have broken down somewhat. They are little bit larger than last week. The left plantar wound continues to undermine laterally but the surface is clean. The right plantar foot wound shows some decreased depth in the tunnel tracking towards his dorsal medial foot. He has not yet had the Doppler study that I ordered; it sounds like there is some confusion about the scheduling of the procedure. In addition, the MRI was denied and I have taken steps to appeal the denial. 11/24/2021: Since our last visit, [name] was admitted to the hospital where an MRI suggested osteomyelitis. He was taken  the operating room by podiatry. Bone biopsies were negative for osteomyelitis. They debrided his wounds and applied myriad matrix. He saw them last week and they removed his staples. He is here today to continue his wound healing process. T oday, both of the dorsal foot/ankle wounds are substantially smaller. There is just a little eschar overlying the left sided wound and some eschar and slough on the right. The right plantar foot ulcer has the healthiest surface of granulation tissue that I have seen to date. A portion of the myriad matrix failed to take and was hanging loose. It appears that myriad morcells were placed into the tunnel closest to the dorsal portion of his foot. These have sloughed off. The left plantar foot ulcer is about the same size, but has a much healthier surface than in the past. Both plantar ulcers have callus and slough accumulation. 12/07/2021: Left dorsal foot/ankle wound is closed. The right dorsal  foot/ankle wound is nearly closed and just has a small open area with some eschar and slough. The right plantar foot wound has contracted quite a bit since our last visit. It has a healthy surface with just a little bit of slough accumulation and periwound callus buildup. The left plantar foot wound is about the same size but the surface appears healthy. There is a little slough and periwound callus on this side, as well. 12/15/2021: Both dorsal foot/ankle wounds are closed. The right plantar foot wound is substantially smaller than at our last visit. The tunneling that was present has nearly closed. There is just a little bit of slough buildup. The left plantar foot wound is also a little bit smaller today. The surface is the healthiest that I have ever seen it. Light slough and periwound callus accumulation on this side. 12/22/2021: The dorsal ankle/foot wounds remain closed. The right plantar foot wound continues to contract. There is still a bit of depth at the lateral portion of the wound but the surface has a good granulated appearance. The wound on the left is about the same size, the central indented portion is still adherent. It also is clean with good granulation tissue present. The periwound skin and callus are a bit macerated, however. Electronic Signature(s) Signed: 12/22/2021 9:16:54 AM By: [name] MD FACS Entered By: [name] on 12/22/2021 09:16:54 -------------------------------------------------------------------------------- Physical Exam Details Patient Name: Date of Service: [name], [name] 12/22/2021 8:15 A M Medical Record Number: [id number] Patient Account Number: [id number] Date of Birth/Sex: Treating RN: 1985-06-02 (36 y.o. M) Primary Care Provider: [name] Other Clinician: Referring Provider: Treating Provider/Extender: [name] in Treatment: 112 Constitutional Hypertensive, asymptomatic. . . . No acute  distress.Marland Kitchen Respiratory Normal work of breathing on room air.. Notes 12/22/2021: The dorsal ankle/foot wounds remain closed. The right plantar foot wound continues to contract. There is still a bit of depth at the lateral portion of the wound but the surface has a good granulated appearance. The wound on the left is about the same size, the central indented portion is still adherent. It also is clean with good granulation tissue present. The periwound skin and callus are a bit macerated, however. Electronic Signature(s) Signed: 12/22/2021 9:18:26 AM By: [name] MD FACS Entered By: [name] on 12/22/2021 09:18:26 -------------------------------------------------------------------------------- Physician Orders Details Patient Name: Date of Service: [name], [name] 12/22/2021 8:15 A M Medical Record Number: [id number] Patient Account Number: [id number] Date of Birth/Sex: Treating RN: 18-Dec-1985 (36 y.o. [name] Primary Care  Provider: [name] Other Clinician: Referring Provider: Treating Provider/Extender: [name] in Treatment: [phone] Verbal / Phone Orders: No Diagnosis Coding ICD-10 Coding Code Description E11.621 Type 2 diabetes mellitus with foot ulcer L97.528 Non-pressure chronic ulcer of other part of left foot with other specified severity L97.518 Non-pressure chronic ulcer of other part of right foot with other specified severity L97.322 Non-pressure chronic ulcer of left ankle with fat layer exposed Follow-up Appointments ppointment in 1 week. - Dr. [name] - Room 1 with [name] Return A Wednesday 12/29/21 at 08:15 AM Bathing/ Shower/ Hygiene May shower and wash wound with soap and water. Edema Control - Lymphedema / SCD / Other Bilateral Lower Extremities Elevate legs to the level of the heart or above for 30 minutes daily and/or when sitting, a frequency of: - throughout the day Avoid standing for long periods of  time. Exercise regularly Compression stocking or Garment 20-30 mm/Hg pressure to: - to both legs daily Off-Loading Open toe surgical shoe to: - Both feet Additional Orders / Instructions Follow Nutritious Diet Wound Treatment Wound #3 - Foot Wound Laterality: Plantar, Left, Lateral Cleanser: Soap and Water 1 x Per Day/30 Days Discharge Instructions: May shower and wash wound with dial antibacterial soap and water prior to dressing change. Peri-Wound Care: Zinc Oxide Ointment 30g tube 1 x Per Day/30 Days Discharge Instructions: Apply Zinc Oxide to periwound with each dressing change Peri-Wound Care: Sween Lotion (Moisturizing lotion) 1 x Per Day/30 Days Discharge Instructions: Apply moisturizing lotion as directed Prim Dressing: Promogran Prisma Matrix, 4.34 (sq in) (silver collagen) (Dispense As Written) 1 x Per Day/30 Days ary Discharge Instructions: Moisten collagen with saline or hydrogel Secondary Dressing: ABD Pad, 8x10 (Dispense As Written) 1 x Per Day/30 Days Discharge Instructions: Apply over primary dressing as directed. Secondary Dressing: Optifoam Non-Adhesive Dressing, 4x4 in (Dispense As Written) 1 x Per Day/30 Days Discharge Instructions: Apply over primary dressing as directed. Secondary Dressing: NonWoven Sponge, 4x4 (in/in) (Generic) 1 x Per Day/30 Days Secured With: 47M Medipore H Soft Cloth Surgical T ape, 4 x 10 (in/yd) (Generic) 1 x Per Day/30 Days Discharge Instructions: Secure with tape as directed. Compression Wrap: Kerlix Roll 4.5x3.1 (in/yd) 1 x Per Day/30 Days Discharge Instructions: Apply Kerlix and Coban compression as directed. Compression Wrap: 47M ACE Elastic Bandage With VELCRO Brand Closure, 4 (in) (Generic) 1 x Per Day/30 Days Wound #8 - Foot Wound Laterality: Right, Lateral Cleanser: Soap and Water 1 x Per Day/30 Days Discharge Instructions: May shower and wash wound with dial antibacterial soap and water prior to dressing change. Peri-Wound Care:  Sween Lotion (Moisturizing lotion) 1 x Per Day/30 Days Discharge Instructions: Apply moisturizing lotion as directed Prim Dressing: Promogran Prisma Matrix, 4.34 (sq in) (silver collagen) (Dispense As Written) 1 x Per Day/30 Days ary Discharge Instructions: Moisten collagen with saline or hydrogel Secondary Dressing: ABD Pad, 8x10 (Dispense As Written) 1 x Per Day/30 Days Discharge Instructions: Apply over primary dressing as directed. Secondary Dressing: Optifoam Non-Adhesive Dressing, 4x4 in (Dispense As Written) 1 x Per Day/30 Days Discharge Instructions: Apply over primary dressing as directed. Secondary Dressing: NonWoven Sponge, 4x4 (in/in) (Generic) 1 x Per Day/30 Days Secured With: 47M Medipore H Soft Cloth Surgical T ape, 4 x 10 (in/yd) (Generic) 1 x Per Day/30 Days Discharge Instructions: Secure with tape as directed. Compression Wrap: Kerlix Roll 4.5x3.1 (in/yd) 1 x Per Day/30 Days Discharge Instructions: Apply Kerlix and Coban compression as directed. Compression Wrap: 47M ACE Elastic Bandage With VELCRO Brand  Closure, 4 (in) (Generic) 1 x Per Day/30 Days Patient Medications llergies: metformin A Notifications Medication Indication Start End prior to debridement 12/22/2021 lidocaine DOSE topical 4 % cream - cream topical Electronic Signature(s) Signed: 12/22/2021 12:13:04 PM By: [name] MD FACS Entered By: [name] on 12/22/2021 09:18:44 -------------------------------------------------------------------------------- Problem List Details Patient Name: Date of Service: [name], [name] 12/22/2021 8:15 A M Medical Record Number: [id number] Patient Account Number: [id number] Date of Birth/Sex: Treating RN: 17-Sep-1985 (36 y.o. [name] Primary Care Provider: [name] Other Clinician: Referring Provider: Treating Provider/Extender: [name] in Treatment: 34 Active Problems ICD-10 Encounter Code Description  Active Date MDM Diagnosis E11.621 Type 2 diabetes mellitus with foot ulcer 10/24/2019 No Yes L97.528 Non-pressure chronic ulcer of other part of left foot with other specified 10/24/2019 No Yes severity L97.518 Non-pressure chronic ulcer of other part of right foot with other specified 10/24/2019 No Yes severity L97.322 Non-pressure chronic ulcer of left ankle with fat layer exposed 09/29/2021 No Yes Inactive Problems ICD-10 Code Description Active Date Inactive Date L97.518 Non-pressure chronic ulcer of other part of right foot with other specified severity 07/14/2020 07/14/2020 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, right ankle and foot 10/24/2019 10/24/2019 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 08/10/2020 08/10/2020 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, left ankle and foot 10/24/2019 10/24/2019 B95.62 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 10/24/2019 10/24/2019 classified elsewhere Resolved Problems Electronic Signature(s) Signed: 12/22/2021 9:11:27 AM By: [name] MD FACS Entered By: [name] on 12/22/2021 09:11:27 -------------------------------------------------------------------------------- Progress Note Details Patient Name: Date of Service: [name], [name] 12/22/2021 8:15 A M Medical Record Number: [id number] Patient Account Number: [id number] Date of Birth/Sex: Treating RN: 08/02/1985 (36 y.o. 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 He comes in for review of a wound over the plantar fifth metatarsal head extending into the lateral part of the foot. He was followed for this previously by his podiatrist Dr. Cornelius [name]. As the patient tells his story he went to see podiatry first for a swelling he developed on the lateral part of his fifth metatarsal head in May. He states this was "open" by podiatry and the area closed. He was followed up in June and it was again opened callus removed and it closed promptly. There were plans being made for surgery on the fifth metatarsal head in June however his blood sugar was apparently too high for anesthesia. Apparently the area was debrided and opened again in June and it is never closed since. Looking over the records from podiatry I am really not able to follow this. It was clear when he was first seen it was before 5/14 at that point he already had a wound. By 5/17 the ulcer was resolved. I do not see anything about a procedure. On 5/28 noted to have pre-ulcerative moderate keratosis. X-ray noted 1/5 contracted toe and tailor's bunion and metatarsal deformity. On a visit date on 09/28/2018 the dorsal part  of the left foot it healed and resolved. There was concern about swelling in his lower extremity he was sent to the ER.. As far as I can tell he was seen in the ER on 7/12 with an ulcer on his left foot. A DVT rule out of the left leg was negative. I do not think I have complete records from podiatry but I am not able to verify the procedures this patient states he had. He states after the last procedure the wound has never closed although I am not able to follow this in the records I have from podiatry. He has not had a recent x-ray The patient has been using Neosporin on the wound. He is wearing a Darco shoe. He is still very active up on his  foot working and exercising. Past medical history; type 2 diabetes ketosis-prone, leg swelling with a negative DVT study in July. Non-smoker ABI in our clinic was 0.85 on the left 10/16; substantial wound on the plantar left fifth met head extending laterally almost to the dorsal fifth MTP. We have been using silver alginate we gave him a Darco forefoot off loader. An x-ray did not show evidence of osteomyelitis did note soft tissue emphysema which I think was due to gas tracking through an open wound. There is no doubt in my mind he requires an MRI 10/23; MRI not booked until 3 November at the earliest this is largely due to his glucose sensor in the right arm. We have been using silver alginate. There has been an improvement 10/29; I am still not exactly sure when his MRI is booked for. He says it is the third but it is the 10th in epic. This definitely needs to be done. He is running a low-grade fever today but no other symptoms. No real improvement in the 1 02/26/2019 patient presents today for a follow-up visit here in our clinic he is last been seen in the clinic on October 29. Subsequently we were working on getting MRI to evaluate and see what exactly was going on and where we would need to go from the standpoint of whether or not he had osteomyelitis and again what treatments were going be required. Subsequently the patient ended up being admitted to the hospital on 02/07/2019 and was discharged on 02/14/2019. This is a somewhat interesting admission with a discharge diagnosis of pneumonia due to COVID-19 although he was positive for COVID-19 when tested at the urgent care but negative x2 when he was actually in the hospital. With that being said he did have acute respiratory failure with hypoxia and it was noted he also have a left foot ulceration with osteomyelitis. With that being said he did require oxygen for his pneumonia and I level 4 L. He was placed on antivirals and steroids for the  COVID-19. He was also transferred to the [facility] at one point. Nonetheless he did subsequently discharged home and since being home has done much better in that regard. The CT angiogram did not show any pulmonary embolism. With regard to the osteomyelitis the patient was placed on vancomycin and Zosyn while in the hospital but has been changed to Augmentin at discharge. It was also recommended that he follow- up with wound care and podiatry. Podiatry however wanted him to see [name] according to the patient prior to them doing anything further. His hemoglobin A1c was 9.9 as noted in the hospital. Have an MRI of the left foot performed while in the hospital  on 02/04/2019. 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 There is an overlying plantar open wound noted an abscess tracking back along the lateral aspect of the fifth metatarsal shaft. There is otherwise diffuse cellulitis and mild fasciitis without findings of polymyositis. The patient did have recently pneumonia secondary to COVID-19 I looked in the chart through epic and it does appear that the patient may need to have an additional x-ray just to ensure everything is cleared and that he has no airspace disease prior to putting him into the [name]. 03/05/2019; patient was readmitted to the clinic last week. He was hospitalized twice for a viral upper respiratory tract infection from 11/1 through 11/4 and then 11/5 through 11/12 ultimately this turned out to be Covid pneumonitis. Although he was discharged on oxygen he is not using it. He says he feels fine. He has no exercise limitation no cough no sputum. His O2 sat in our clinic today was 100% on room air. 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 He received Vanco and Zosyn in  the hospital and then was discharged on 2 weeks of Augmentin. I do not see any relevant cultures. He was supposed to follow-up with infectious disease but I do not see that he has an appointment. 12/8; patient saw Dr. Novella Olive of infectious disease last week. He felt that he had had adequate antibiotic therapy. He did not go to follow-up with Dr. Amalia Hailey of podiatry and I have again talked to him about the pros and cons of this. He does not want to consider a ray amputation of this time. He is aware of the risks of recurrence, migration etc. He started HBO today and tolerated this well. He can complete the Augmentin that I gave him last week. I have looked over the lab work that Dr. Chana Bode ordered his C-reactive protein was 3.3 and his sedimentation rate was 17. The C-reactive protein is never really been measurably that high in this patient 12/15; not much change in the wound today however he has undermining along the lateral part of the foot again more extensively than last week. He has some rims of epithelialization. We have been using silver alginate. He is undergoing hyperbarics but did not dive today 12/18; in for his obligatory first total contact cast change. Unfortunately there was pus coming from the undermining area around his fifth metatarsal head. This was cultured but will preclude reapplication of a cast. He is seen in conjunction with HBO 12/24; patient had staph lugdunensis in the wound in the undermining area laterally last time. We put him on doxycycline which should have covered this. The wound looks better today. I am going to give him another week of doxycycline before reattempting the total contact cast 12/31; the patient is completing antibiotics. Hemorrhagic debris in the distal part of the wound with some undermining distally. He also had hyper granulation. Extensive debridement with a #5 curette. The infected area that was on the lateral part of the fifth met head is closed  over. I do not think he needs any more antibiotics. Patient was seen prior to HBO. Preparations for a total contact cast were made in the cast will be placed post hyperbarics 04/11/19; once again the patient arrives today without complaint. He had been in a cast all week noted that  he had heavy drainage this week. This resulted in large raised areas of macerated tissue around the wound 1/14; wound bed looks better slightly smaller. Hydrofera Blue has been changing himself. He had a heavy drainage last week which caused a lot of maceration around the wound so I took him out of a total contact cast he says the drainage is actually better this week He is seen today in conjunction with HBO 1/21; returns to clinic. He was up in [place] for a day or 2 attending a funeral. He comes back in with the wound larger and with a large area of exposed bone. He had osteomyelitis and septic arthritis of the fifth left metatarsal head while he was in hospital. He received IV antibiotics in the hospital for a prolonged period of time then 3 weeks of Augmentin. Subsequently I gave him 2 weeks of doxycycline for more superficial wound infection. When I saw this last week the wound was smaller the surface of the wound looks satisfactory. 1/28; patient missed hyperbarics today. Bone biopsy I did last time showed Enterococcus faecalis and Staphylococcus lugdunensis . He has a wide area of exposed bone. We are going to use silver alginate as of today. I had another ethical discussion with the patient. 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 In this situation I think the likelihood of healing this is low. Therefore I have recommended a ray amputation and with the patient's agreement I have referred him to Dr. Doran Durand. The other issue is that his compliance with hyperbarics has been minimal because of his work schedule and given his underlying decision I am going to stop this  today READMISSION 10/24/2019 MRI 09/29/2019 left foot IMPRESSION: 1. Apparent skin ulceration inferior and lateral to the 5th metatarsal base with underlying heterogeneous T2 signal and enhancement in the subcutaneous fat. Small peripherally enhancing fluid collections along the plantar and lateral aspects of the 5th metatarsal base suspicious for abscesses. 2. Interval amputation through the mid 5th metatarsal with nonspecific low-level marrow edema and enhancement. Given the proximity to the adjacent soft tissue inflammatory changes, osteomyelitis cannot be excluded. 3. The additional bones appear unremarkable. MRI 09/29/2019 right foot IMPRESSION: 1. Soft tissue ulceration lateral to the 5th MTP joint. There is low-level T2 hyperintensity within the 4th and 5th metatarsal heads and adjacent proximal phalanges without abnormal T1 signal or cortical destruction. These findings are nonspecific and could be seen with early marrow edema, hyperemia or early osteomyelitis. No evidence of septic joint. 2. Mild tenosynovitis and synovial enhancement associated with the extensor digitorum tendons at the level of the midfoot. 3. Diffuse low-level muscular T2 hyperintensity and enhancement, most consistent with diabetic myopathy. LEFT FOOT BONE Methicillin resistant staphylococcus aureus Staphylococcus lugdunensis MIC MIC CIPROFLOXACIN >=8 RESISTANT Resistant <=0.5 SENSI... Sensitive CLINDAMYCIN <=0.25 SENS... Sensitive >=8 RESISTANT Resistant ERYTHROMYCIN >=8 RESISTANT Resistant >=8 RESISTANT Resistant GENTAMICIN <=0.5 SENSI... Sensitive <=0.5 SENSI... Sensitive Inducible Clindamycin NEGATIVE Sensitive NEGATIVE Sensitive OXACILLIN >=4 RESISTANT Resistant 2 SENSITIVE Sensitive RIFAMPIN <=0.5 SENSI... Sensitive <=0.5 SENSI... Sensitive TETRACYCLINE <=1 SENSITIVE Sensitive <=1 SENSITIVE Sensitive TRIMETH/SULFA <=10 SENSIT Sensitive <=10 SENSIT Sensitive ... Marland Kitchen.. VANCOMYCIN 1 SENSITIVE  Sensitive <=0.5 SENSI... Sensitive Right foot bone . Component 3 wk ago Specimen Description BONE Special Requests RIGHT 4 METATARSAL SAMPLE B Gram Stain NO WBC SEEN NO ORGANISMS SEEN Culture RARE METHICILLIN RESISTANT STAPHYLOCOCCUS AUREUS NO ANAEROBES ISOLATED Performed at [facility], [place] [address], [place], [place] [address] Report Status 10/08/2019 FINAL Organism ID, Bacteria  METHICILLIN RESISTANT STAPHYLOCOCCUS AUREUS Resulting Agency [facility] Susceptibility Methicillin resistant staphylococcus aureus MIC CIPROFLOXACIN >=8 RESISTANT Resistant CLINDAMYCIN <=0.25 SENS... Sensitive ERYTHROMYCIN >=8 RESISTANT Resistant GENTAMICIN <=0.5 SENSI... Sensitive Inducible Clindamycin NEGATIVE Sensitive OXACILLIN >=4 RESISTANT Resistant RIFAMPIN <=0.5 SENSI... Sensitive TETRACYCLINE <=1 SENSITIVE Sensitive TRIMETH/SULFA <=10 SENSIT Sensitive ... VANCOMYCIN 1 SENSITIVE Sensitive This is a patient we had in clinic earlier this year with a wound over his left fifth metatarsal head. 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 Doran Durand on 05/16/2019. He seems to have developed problems with wounds on his bilateral feet in June 2021 although he may have had problems earlier than this. He was in an urgent care with a right foot ulcer on 09/26/2019 and given a course of doxycycline. This was apparently after having trouble getting into see orthopedics. He was seen by podiatry on 09/28/2019 noted to have bilateral lower extremity ulcers including the left lateral fifth metatarsal base and the right subfifth met head. It was noted that had purulent drainage at that time. He required hospitalization from 6/20 through  7/2. This was because of worsening right foot wounds. He underwent bilateral operative incision and drainage and bone biopsies bilaterally. Culture results are listed above. He has been referred back to clinic by Dr. Jacqualyn Posey of podiatry. He is also followed by Dr. Megan Salon who saw him yesterday. He was discharged from hospital on Zyvox Flagyl and Levaquin and yesterday changed to doxycycline Flagyl and Levaquin. His inflammatory markers on 6/26 showed a sedimentation rate of 129 and a C-reactive protein of 5. This is improved to 14 and 1.3 respectively. This would indicate improvement. ABIs in our clinic today were 1.23 on the right and 1.20 on the left 11/01/2019 on evaluation today patient appears to be doing fairly well in regard to the wounds on his feet at this point. Fortunately there is no signs of active infection at this time. No fevers, chills, nausea, vomiting, or diarrhea. He currently is seeing infectious disease and still under their care at this point. Subsequently he also has both wounds which she has not been using collagen on as he did not receive that in his packaging he did not call us and let us know that. Apparently that just was missed on the order. Nonetheless we will get that straightened out today. 8/9-Patient returns for bilateral foot wounds, using Prisma with hydrogel moistened dressings, and the wounds appear stable. Patient using surgical shoes, avoiding much pressure or weightbearing as much as possible 8/16; patient has bilateral foot wounds. 1 on the right lateral foot proximally the other is on the left mid lateral foot. Both required debridement of callus and thick skin around the wounds. We have been using silver collagen 8/27; patient has bilateral lateral foot wounds. The area on the left substantially surrounded by callus and dry skin. This was removed from the wound edge. The underlying wound is small. The area on the right measured somewhat smaller today. We've  been using silver collagen the patient was on antibiotics for underlying osteomyelitis in the left foot. Unfortunately I did not update his antibiotics during today's visit. 9/10 I reviewed Dr. Hale Bogus last notes he felt he had completed antibiotics his inflammatory markers were reasonably well controlled. He has a  small wound on the lateral left foot and a tiny area on the right which is just above closed. He is using Hydrofera Blue with border foam he has bilateral surgical shoes 9/24; 2 week f/u. doing well. right foot is closed. left foot still undermined. 10/14; right foot remains closed at the fifth met head. The area over the base of the left fifth metatarsal has a small open area but considerable undermining towards the plantar foot. Thick callus skin around this suggests an adequate pressure relief. We have talked about this. He says he is going to go back into his cam boot. I suggested a total contact cast he did not seem enamored with this suggestion 10/26; left foot base of the fifth metatarsal. Same condition as last time. He has skin over the area with an open wound however the skin is not adherent. He went to see Dr. Earleen Newport who did an x-ray and culture of his foot I have not reviewed the x-ray but the patient was not told anything. He is on doxycycline 11/11; since the patient was last here he was in the emergency room on 10/30 he was concerned about swelling in the left foot. They did not do any cultures or x-rays. They changed his antibiotics to cephalexin. Previous culture showed group B strep. The cephalexin is appropriate as doxycycline has less than predictable coverage. Arrives in clinic today with swelling over this area under the wound. He also has a new wound on the right fifth metatarsal head 11/18; the patient has a difficult wound on the lateral aspect of the left fifth metatarsal head. The wound was almost ballotable last week I opened it slightly expecting to see  purulence however there was just bleeding. I cultured this this was negative. X-ray unchanged. We are trying to get an MRI but I am not sure were going to be able to get this through his insurance. He also has an area on the right lateral fifth metatarsal head this looks healthier 12/3; the patient finally got our MRI. Surprisingly this did not show osteomyelitis. I did show the soft tissue ulceration at the lateral plantar aspect of the fifth metatarsal base with a tiny residual 6 mm abscess overlying the superficial fascia I have tried to culture this area I have not been able to get this to grow anything. Nevertheless the protruding tissue looks aggravated. I suspect we should try to treat the underlying "abscess with broad-spectrum antibiotics. I am going to start him on Levaquin and Flagyl. He has much less edema in his legs and I am going to continue to wrap his legs and see him weekly 12/10. I started Levaquin and Flagyl on him last week. He just picked up the Flagyl apparently there was some delay. The worry is the wound on the left fifth metatarsal base which is substantial and worsening. His foot looks like he inverts at the ankle making this a weightbearing surface. Certainly no improvement in fact I think the measurements of this are somewhat worse. We have been using 12/17; he apparently just got the Levaquin yesterday this is 2 weeks after the fact. He has completed the Flagyl. The area over the left fifth metatarsal base still has protruding granulation tissue although it does not look quite as bad as it did some weeks ago. He has severe bilateral lymphedema although we have not been treating him for wounds on his legs this is definitely going to require compression. There was so much edema in the  left I did not wish to put him in a total contact cast today. I am going to increase his compression from 3-4 layer. The area on the right lateral fifth met head actually look quite good and  superficial. 12/23; patient arrived with callus on the right fifth met head and the substantial hyper granulated callused wound on the base of his fifth metatarsal. He says he is completing his Levaquin in 2 days but I do not think that adds up with what I gave him but I will have to double check this. We are using Hydrofera Blue on both areas. My plan is to put the left leg in a cast the week after New Year's 04/06/2020; patient's wounds about the same. Right lateral fifth metatarsal head and left lateral foot over the base of the fifth metatarsal. There is undermining on the left lateral foot which I removed before application of total contact cast continuing with Hydrofera Blue new. Patient tells me he was seen by endocrinology today lab work was done [Dr. Kerr]. Also wondering whether he was referred to cardiology. I went over some lab work from previously does not have chronic renal failure certainly not nephrotic range proteinuria he does have very poorly controlled diabetes but this is not his most updated lab work. Hemoglobin A1c has been over 11 1/10; the patient had a considerable amount of leakage towards mid part of his left foot with macerated skin however the wound surface looks better the area on the right lateral fifth met head is better as well. I am going to change the dressing on the left foot under the total contact cast to silver alginate, continue with Hydrofera Blue on the right. 1/20; patient was in the total contact cast for 10 days. Considerable amount of drainage although the skin around the wound does not look too bad on the left foot. The area on the right fifth metatarsal head is closed. Our nursing staff reports large amount of drainage out of the left lateral foot wound 1/25; continues with copious amounts of drainage described by our intake staff. PCR culture I did last week showed E. coli and Enterococcus faecalis and low quantities. Multiple resistance genes documented  including extended spectrum beta lactamase, MRSA, MRSE, quinolone, tetracycline. The wound is not quite as good this week as it was 5 days ago but about the same size 2/3; continues with copious amounts of malodorous drainage per our intake nurse. The PCR culture I did 2 weeks ago showed E. coli and low quantities of Enterococcus. There were multiple resistance genes detected. I put Neosporin on him last week although this does not seem to have helped. The wound is slightly deeper today. Offloading continues to be an issue here although with the amount of drainage she has a total contact cast is just not going to work 2/10; moderate amount of drainage. Patient reports he cannot get his stocking on over the dressing. I told him we have to do that the nurse gave him suggestions on how to make this work. The wound is on the bottom and lateral part of his left foot. Is cultured predominantly grew low amounts of Enterococcus, E. coli and anaerobes. There were multiple resistance genes detected including extended spectrum beta lactamase, quinolone, tetracycline. I could not think of an easy oral combination to address this so for now I am going to do topical antibiotics provided by [facility] I think the main agents here are vancomycin and an aminoglycoside. We have to  be able to give him access to the wounds to get the topical antibiotic on 2/17; moderate amount of drainage this is unchanged. He has his Keystone topical antibiotic against the deep tissue culture organisms. He has been using this and changing the dressing daily. Silver alginate on the wound surface. 2/24; using Keystone antibiotic with silver alginate on the top. He had too much drainage for a total contact cast at one point although I think that is improving and I think in the next week or 2 it might be possible to replace a total contact cast I did not do this today. In general the wound surface looks healthy however he continues to have  thick rims of skin and subcutaneous tissue around the wide area of the circumference which I debrided 06/04/2020 upon evaluation today patient appears to be doing well in regard to his wound. I do feel like he is showing signs of improvement. There is little bit of callus and dead tissue around the edges of the wound as well as what appears to be a little bit of a sinus tract that is off to the side laterally I would perform debridement to clear that away today. 3/17; left lateral foot. The wound looks about the same as I remember. Not much depth surface looks healthy. No evidence of infection 3/25; left lateral foot. Wound surface looks about the same. Separating epithelium from the circumference. There really is no evidence of infection here however not making progress by my view 3/29; left lateral foot. Surface of the wound again looks reasonably healthy still thick skin and subcutaneous tissue around the wound margins. There is no evidence of infection. One of the concerns being brought up by the nurses has again the amount of drainage vis--vis continued use of a total contact cast 4/5; left lateral foot at roughly the base of the fifth metatarsal. Nice healthy looking granulated tissue with rims of epithelialization. The overall wound measurements are not any better but the tissue looks healthy. The only concern is the amount of drainage although he has no surrounding maceration with what we have been doing recently to absorb fluid and protect his skin. He also has lymphedema. 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 He states that the kerlix/coban under the TCC rolled down and could not get it back up. He has been using an offloading scooter and has  somehow hurt his right foot using this device. This happened last week. He states that the side of his right foot developed a blister and opened. The top of his foot also has a few small open wounds he thinks is due to his socks rubbing in his shoes. He has not been using any dressings to the wound. He denies purulent drainage, fever/chills or erythema to the wounds. 4/22; patient presents for 1 week follow-up. He developed new wounds to the right foot that were evaluated at last clinic visit. He continues to have a total contact cast to the left leg and he reports no issues. He has been using silver collagen to the right foot wounds with no issues. He denies purulent drainage, fever/chills or erythema to the right foot wounds. He has no complaints today 4/25; patient presents for 1  week follow-up. He has a total contact cast of the left leg and reports no issues. He has been using silver alginate to the right foot wound. He denies purulent drainage, fever/chills or erythema to the right foot wounds. 5/2 patient presents for 1 week follow-up. T contact cast on the left. The wound which is on the base of the plantar foot at the base of the fifth metatarsal otal actually looks quite good and dimensions continue to gradually contract. HOWEVER the area on the right lateral fifth metatarsal head is much larger than what I remember from 2 weeks ago. Once more is he has significant levels of hypergranulation. Noteworthy that he had this same hyper granulated response on his wound on the left foot at one point in time. So much so that he I thought there was an underlying fluid collection. Based on this I think this just needs debridement. 5/9; the wound on the left actually continues to be gradually smaller with a healthy surface. Slight amount of drainage and maceration of the skin around but not too bad. However he has a large wound over the right fifth metatarsal head very much in the same configuration  as his left foot wound was initially. I used silver nitrate to address the hyper granulated tissue no mechanical debridement 5/16; area on the left foot did not look as healthy this week deeper thick surrounding macerated skin and subcutaneous tissue. oo The area on the right foot fifth met head was about the same oo The area on the right ankle that we identified last week is completely broken down into an open wound presumably a stocking rubbing issue 5/23; patient has been using a total contact cast to the left side. He has been using silver alginate underneath. He has also been using silver alginate to the right foot wounds. He has no complaints today. He denies any signs of infection. 5/31; the left-sided wound looks some better measure smaller surface granulation looks better. We have been using silver alginate under the total contact cast oo The large area on his right fifth met head and right dorsal foot look about the same still using silver alginate 6/6; neither side is good as I was hoping although the surface area dimensions are better. A lot of maceration on his left and right foot around the wound edge. Area on the dorsal right foot looks better. He says he was traveling. I am not sure what does the amount of maceration around the plantar wounds may be drainage issues 6/13; in general the wound surfaces look quite good on both sides. Macerated skin and raised edges around the wound required debridement although in general especially on the left the surface area seems improved. oo The area on the right dorsal ankle is about the same I thought this would not be such a problem to close 6/20; not much change in either wound although the one on the right looks a little better. Both wounds have thick macerated edges to the skin requiring debridements. We have been using silver alginate. The area on the dorsal right ankle is still open I thought this would be closed. 6/28; patient comes in  today with a marked deterioration in the right foot wound fifth met head. Wide area of exposed bone this is a drastic change from last time. The area on the left there we have been casting is stagnant. We have been using silver alginate in both wound areas. 7/5; bone culture I did for PCR  last time was positive for Pseudomonas, group B strep, Enterococcus and Staph aureus. There was no suggestion of methicillin resistance or ampicillin resistant genes. This was resistant to tetracycline however He comes into the clinic today with the area over his right plantar fifth metatarsal head which had been doing so well 2 weeks ago completely necrotic feeling bone. I do not know that this is going to be salvageable. The left foot wound is certainly no smaller but it has a better surface and is superficial. 7/8; patient called in this morning to say that his total contact cast was rubbing against his foot. He states he is doing fine overall. He denies signs of infection. 7/12; continued deterioration in the wound over the right fifth metatarsal head crumbling bone. This is not going to be salvageable. The patient agrees and wants to be referred to Dr. Doran Durand which we will attempt to arrange as soon as possible. I am going to continue him on antibiotics as long as that takes so I will renew those today. The area on the left foot which is the base of the fifth metatarsal continues to look somewhat better. Healthy looking tissue no depth no debridement is necessary here. 7/20; the patient was kindly seen by Dr. Doran Durand of orthopedics on 10/19/2020. 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 Towards this end we have taken him out of the total contact cast on the left we will put him in a wrap with Hydrofera Blue. As I understand things surgery is planned for 7/21 7/27; patient had his surgery last Thursday. He only had  the fifth ray amputation. Apparently everything went well we did not still disturb that today The area on the left foot actually looks quite good. He has been much less mobile which probably explains this he did not seem to do well in the total contact cast secondary to drainage and maceration I think. 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 Fortunately there is no evidence of active infection at this time. No fevers, chills, nausea, vomiting, or diarrhea. Overall I think that he is actually doing extremely well. Nonetheless I do believe that he is staying off of this more following the surgery in his right foot that is the reason the left is doing so great. 8/16; left plantar foot wound. This looks smaller than the last time I saw this he is using Hydrofera Blue. The surgical wound on the right foot is being followed by Dr. Doran Durand we did not look at this today. He has surgical shoes on both feet 8/23; left plantar foot wound not as good this week. Surrounding macerated skin and subcutaneous tissue everything looks moist and wet. I do not think he is offloading this adequately. He is using a surgical shoe Apparently the right foot surgical wound is not open although I did not check his foot 8/31; left plantar foot lateral aspect. Much improved this week. He has no maceration. Some improvement in the surface area of the wound but most impressively the depth is come in we are using silver alginate. The patient is a Product/process development scientist. He is asked that we write him a letter so he can go back to work. I have also tried to see if we can write  something that will allow him to limit the amount of time that he is on his foot at work. 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 Hopefully the latter can be  avoided 9/6; I believe the patient missed an appointment last week. He arrives in today with a wound looking roughly the same certainly no better. Undermining laterally and also inferiorly. We used molecuLight today in training with the patient's permission.. We are using silver alginate 9/21 wound is measuring bigger this week although this may have to do with the aggressive circumferential debridement last week in response to the blush fluorescence on the MolecuLight. Culture I did last week showed significant MSSA and E. coli. I put him on Augmentin but he has not started it yet. We are also going to send this for compounded antibiotics at [facility]. There is no evidence of systemic infection 9/29; silver alginate. His Keystone arrived. He is completing Augmentin in 2 days. Offloading in a cam boot. Moderate drainage per our intake staff 10/5; using silver alginate. He has been using his [place]. He has completed his Augmentin. Per our intake nurse still a lot of drainage, far too much to consider a total contact cast. Wound measures about the same. He had the same undermining area that I defined last week from a roughly 11-3. I remove this today 10/12; using silver alginate he is using the [facility]. He comes in for a nurse visit hence we are applying [employer] twice a week. Measuring slightly better today and less notable drainage. Extensive debridement of the wound edge last time 10/18; using topical Keystone and silver alginate and a soft cast. Wound measurements about the same. Drainage was through his soft cast. We are changing this twice a week Tuesdays and Friday 10/25; comes in with moderate drainage. Still using Keystone silver alginate and a soft cast. Wound dimensions completely the same.He has a lot of edema in the left leg he has lymphedema. Asking for [name] to consider wrapping him as he cannot get his stocking on over the soft cast 11/2; comes in with moderate to large drainage slightly  smaller in terms of width we have been using [place]. His wound looks satisfactory but not much improvement 11/4; patient presents today for obligatory cast change. Has no issues or complaints today. He denies signs of infection. 11/9; patient traveled this weekend to [place], was on the cast quite a bit. Staining of the cast with black material from his walking boot. Drainage was not quite as bad as we feared. Using silver alginate and Keystone 11/16; we do not have size for cast therefore we have been putting a soft cast on him since the change on Friday. Still a significant amount of drainage necessitating changing twice a week. We have been using the Keystone at cast changes either hard or soft as well as silver alginate Comes in the clinic with things actually looking fairly good improvement in width. He says his offloading is about the same 02/24/2021 upon evaluation today patient actually comes back in and is doing excellent in regard to his foot ulcer this is significantly smaller even compared to the last visit. The soft cast seems to have done extremely well for him which is great news. I do not see any signs of infection minimal debridement will be needed today. 11/30; left lateral foot much improved half a centimeter improvement in surface area. No evidence of  infection. He seems to be doing better with the soft cast in the TCC therefore we will continue with this. He comes back in later in the week for a change with the nurses. This is due to drainage 12/6; no improvement in dimensions. Under illumination some debris on the surface we have been using silver alginate, soft cast. If there is anything optimistic here he seems to have have less drainage 12/13. Dimensions are improved both length and width and slightly in depth. Appears to be quite healthy today. Raised edges of this thick skin and callus around the edges however. He is in a soft cast were bringing him back once for a change on  Friday. Drainage is better 12/20. Dimensions are improved. He still has raised edges of thick skin and callus around the edges. We are using a soft cast 12/28; comes in today with thick callus around the wound. Using silver under alginate under a soft cast. I do not think there is much improvement in any measurement 2023 04/06/2021; patient was put in a total contact cast. Unfortunately not much change in surface area 1/10; not much different still thick callus and skin around the edge in spite of the total contact cast. This was just debrided last week we have been using the [facility] compounded antibiotic and silver alginate under a total contact cast 1/18 the patient's wound on the left side is doing nicely. smaller HOWEVER he comes in today with a wound on the right foot laterally. blister most likely serosangquenous drainage 1/24; the patient continues to do well in terms of the plantar left foot which is continued to contract using silver alginate under the total contact cast HOWEVER the right lateral foot is bigger with denuded skin around the edges. I used pickups and a #15 scalpel to remove this this looks like the remanence of a large blister. Cannot rule out infection. Culture in this area I did last week showed Staphylococcus lugdunensis few colonies. I am going to try to address this with his [employer] antibiotic that is done so well on the left having linezolid and this should cover the staph 2/1; the patient's wound on his left foot which was the original plantar foot wound thick skin and eschar around the edges even in the total contact cast but the wound surface does not look too bad The real problem is on how his right lateral foot at roughly the base of the fifth metatarsal. The wound is completely necrotic more worrisome than that there is swelling around the edges of this. We have been using silver alginate on both wounds and Keystone on the right foot. Unfortunately I think he is  going to require systemic antibiotics while we await cultures. He did not get the x-ray done that we ordered last week [lost the prescription 2/7; disappointingly in the area on the left foot which we are treating with a total contact cast is still not closed although it is much smaller. He continues to have a lot of callus around the wound edge. -Right lateral foot culture I did last week was negative x-ray also negative for osteomyelitis. 2/15: TCC silver alginate on the left and silver alginate on the right lateral. No real improvement in either area 05/26/2021: T oday, the wounds are roughly the same size as at his previous visit, post-debridement. He continues to endorse fairly substantial drainage, particularly on the right. He has been in a total contact cast on the left. There is still some callus  surrounding this lesion. On the right, the periwound skin is quite macerated, along with surrounding callus. The center of the right-sided wound also has some dark, densely adherent material, which is very difficult to remove. 06/02/2021: Today, both wounds are slightly smaller. He has been using zinc oxide ointment around the right ulcer and the degree of maceration has improved markedly. There continues to be an area of nonviable tissue in the center of the right sided ulcer. The left-sided wound, which has been in the total contact cast. Appears clean and the degree of callus around it is less than previously. 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 He had to take the stairs and both wounds have increased in size. The left foot, which has been in a total contact cast, has developed a tunnel tracking to the lateral aspect of his foot. The nonviable tissue in the center of the right-sided ulcer remains recalcitrant to debridement. There is significant undermining surrounding the entirety of the left sided wound. 06/16/2021: The  elevator at school has been fixed and the patient has been able to avoid putting as much weight on his wounds over the past week. We converted the left foot wound into a single lesion today, but despite this, the wound is actually smaller. The base is healthy with limited periwound callus. On the right, the central necrotic area is still present. He continues to be quite macerated around the right sided wound, despite applying barrier cream. This does, however, have the benefit of softening the callus to make it more easily removable. 06/23/2021: Today, the left wound is smaller. The lateral aspect that had opened up previously is now closed. The wound base has a healthy bed of granulation tissue and minimal slough. Unfortunately, on the right, the wound is larger and continues to be fairly macerated. He has also reopened the wound at his right ankle. He thinks this is due to the gait he has adopted secondary to his total contact cast and boot. 06/30/2021: T oday, both wounds are a little bit larger. The lateral aspect on the left has remained closed. He continues to have significant periwound maceration. The culture that I took from the right sided wound grew a population of bacteria that is not covered by his current [facility] antibiotic. The center of the right- sided wound continues to appear necrotic with nonviable fat. It probes deeper today, but does not reach bone. 07/07/2021: The periwound maceration is a little bit less today. The right lateral foot wound has some areas that appear more viable and the necrotic center also looks a little bit better. The wound on the dorsal surface of his right foot near the ankle is contracting and the surface appears healthy. The left plantar wound surface looks healthy, but there is some new undermining on the medial portion. He did get his new Keystone antibiotic and began applying that to the right foot wound on Saturday. 07/14/2021: The intake nurse reported  substantial drainage from his wounds, but the periwound skin actually looks better than is typical for him. The wound on the dorsal surface of his right foot near the ankle is smaller and just has a small open area underneath some dried eschar. The left plantar wound surface looks healthy and there has been no significant accumulation of callus. The right lateral foot wound looks quite a bit better, with the central portion, which typically appears necrotic, looking more viable albeit pale. 07/22/2021:  His left foot is extremely macerated today. The wound is about the same size. The wound on the dorsal surface of his right foot near the ankle had closed, but he traumatized it removing the dressing and there is a tiny skin tear in that location. The right lateral foot wound is bigger, but the surface appears healthy. 07/30/2021: The wound on the dorsal surface of his right foot near the ankle is closed. The right lateral foot wound again is a little bit bigger due to some undermining. The periwound skin is in better condition, however. He has been applying zinc oxide. The wound surface is a little bit dry today. On the left, he does not have the substantial maceration that we frequently see. The wound itself is smaller and has a clean surface. 08/06/2021: Both wounds seem to have deteriorated over the past week. The right lateral foot wound has a dry surface but the periwound is boggy.. Overall wound dimensions are about the same. On the left, the wound is about the same size, but there is more undermining present underneath periwound callus. 08/13/2021: The right sided wound looks about the same, but on the left there has been substantial deterioration. The undermining continues to extend under periwound callus. Once this was removed, substantial extension of the wound was present. There is no odor or purulent drainage but clearly the wounds have broken down. 08/20/2021: The wounds look about the same  today. He has been out of his total contact cast and has just been changing the dressings using topical Keystone with PolyMem Ag, Kerlix and Ace bandages. The wound on the top of his right ankle has reopened but this is quite small. There was a little bit of purulent material that I expressed when examining this wound. 08/24/2021: After the aggressive debridement I performed at his last visit, the wounds actually look a little bit better today. They are smaller with the exception of the wound on the top of his right ankle which is a little bit bigger as some more skin pulled off when he was changing his dressing. We are using topical Keystone with PolyMem Ag Kerlix and Ace bandages. 09/02/2021: There has been really no change to any of his wounds. 09/16/2021: The patient was hospitalized last week with nausea, vomiting, and dehydration. He says that while he was in the hospital, his wounds were not really addressed properly. T oday, both plantar foot wounds are larger and the periwound skin is macerated. The wound on the dorsum of his right foot has a scab on the top. The right foot now has a crater where previously he had had nonviable fat. It looks as though this simply died and fell out. The periwound callus is wet. 09/24/2021: His wounds have deteriorated somewhat since his last visit. The wound on the dorsum of his right foot near his ankle is larger and has more nonviable tissue present. The crater in his right foot is even deeper; I cannot quite palpate or probe to bone but I am sure it is close. The wound on his left plantar foot has an odd boggy area in the center that almost feels as though it has fluid within it. He has run out of his topical Keystone antibiotic. We are using silver alginate on his wounds. 09/29/2021: He has developed a new wound on the dorsum of his left foot near his ankle. He says he thinks his wrapping is rubbing in that site. I would concur with this as the wound  on his  right ankle is larger. The left foot looks about the same. The right foot has the crater that was present last week. No significant slough accumulation, but his foot remains quite swollen and warm despite oral antibiotic therapy. 10/08/2021: All of his wounds look about the same as last week. He did not start his oral antibiotics that are prescribed until just a couple of days ago; his [employer] compounded antibiotics formula has been changed and he is awaiting delivery of the new recipe. His MRI that was scheduled for earlier this week was canceled as no prior authorization had been obtained; unfortunately the tech responsible sent an email to my old [place] email, which I no longer use nor have access to. 10/18/2021: The wounds on his bilateral dorsal feet near the ankles are both improved. They are smaller and have just some eschar and slough buildup. The left plantar wound has a fair amount of undermining, but the surface is clean. There is some periwound callus accumulation. On the right plantar foot, there is nonviable fat leading to a deep tunnel that tracks towards his dorsal medial foot. There is periwound callus and slough accumulation, as well. His right foot and leg remain swollen as compared to the left. 10/25/2021: The wounds on his bilateral dorsal feet and at the ankles have broken down somewhat. They are little bit larger than last week. The left plantar wound continues to undermine laterally but the surface is clean. The right plantar foot wound shows some decreased depth in the tunnel tracking towards his dorsal medial foot. He has not yet had the Doppler study that I ordered; it sounds like there is some confusion about the scheduling of the procedure. In addition, the MRI was denied and I have taken steps to appeal the denial. 11/24/2021: Since our last visit, Mr. Kothari was admitted to the hospital where an MRI suggested osteomyelitis. He was taken the operating room  by podiatry. Bone biopsies were negative for osteomyelitis. They debrided his wounds and applied myriad matrix. He saw them last week and they removed his staples. He is here today to continue his wound healing process. T oday, both of the dorsal foot/ankle wounds are substantially smaller. There is just a little eschar overlying the left sided wound and some eschar and slough on the right. The right plantar foot ulcer has the healthiest surface of granulation tissue that I have seen to date. A portion of the myriad matrix failed to take and was hanging loose. It appears that myriad morcells were placed into the tunnel closest to the dorsal portion of his foot. These have sloughed off. The left plantar foot ulcer is about the same size, but has a much healthier surface than in the past. Both plantar ulcers have callus and slough accumulation. 12/07/2021: Left dorsal foot/ankle wound is closed. The right dorsal foot/ankle wound is nearly closed and just has a small open area with some eschar and slough. The right plantar foot wound has contracted quite a bit since our last visit. It has a healthy surface with just a little bit of slough accumulation and periwound callus buildup. The left plantar foot wound is about the same size but the surface appears healthy. There is a little slough and periwound callus on this side, as well. 12/15/2021: Both dorsal foot/ankle wounds are closed. The right plantar foot wound is substantially smaller than at our last visit. The tunneling that was present has nearly closed. There is just a little bit  of slough buildup. The left plantar foot wound is also a little bit smaller today. The surface is the healthiest that I have ever seen it. Light slough and periwound callus accumulation on this side. 12/22/2021: The dorsal ankle/foot wounds remain closed. The right plantar foot wound continues to contract. There is still a bit of depth at the lateral portion of the wound but  the surface has a good granulated appearance. The wound on the left is about the same size, the central indented portion is still adherent. It also is clean with good granulation tissue present. The periwound skin and callus are a bit macerated, however. Patient History Information obtained from Patient. Family History No family history of Cancer, Diabetes, Hereditary Spherocytosis, Hypertension, Kidney Disease, Lung Disease, Seizures, Stroke, Thyroid Problems, Tuberculosis. Social History Never smoker, Marital Status - Single, Alcohol Use - Rarely, Drug Use - No History, Caffeine Use - Never. Medical History Eyes Denies history of Cataracts, Glaucoma, Optic Neuritis Ear/Nose/Mouth/Throat Denies history of Chronic sinus problems/congestion, Middle ear problems Hematologic/Lymphatic Denies history of Anemia, Hemophilia, Human Immunodeficiency Virus, Lymphedema, Sickle Cell Disease Respiratory Denies history of Aspiration, Asthma, Chronic Obstructive Pulmonary Disease (COPD), Pneumothorax, Sleep Apnea, Tuberculosis Cardiovascular Denies history of Angina, Arrhythmia, Congestive Heart Failure, Coronary Artery Disease, Deep Vein Thrombosis, Hypertension, Hypotension, Myocardial Infarction, Peripheral Arterial Disease, Peripheral Venous Disease, Phlebitis, Vasculitis Gastrointestinal Denies history of Cirrhosis , Colitis, Crohnoos, Hepatitis A, Hepatitis B, Hepatitis C Endocrine Patient has history of Type II Diabetes Denies history of Type I Diabetes Immunological Denies history of Lupus Erythematosus, Raynaudoos, Scleroderma Integumentary (Skin) Denies history of History of Burn Musculoskeletal Denies history of Gout, Rheumatoid Arthritis, Osteoarthritis, Osteomyelitis Neurologic Denies history of Dementia, Neuropathy, Quadriplegia, Paraplegia, Seizure Disorder Oncologic Denies history of Received Chemotherapy, Received Radiation Psychiatric Denies history of Anorexia/bulimia,  Confinement Anxiety Hospitalization/Surgery History - 11/1-11/06/2018- sepsis foot infection. - 11/4-11/5 02 sats low respiratory distress. Objective Constitutional Hypertensive, asymptomatic. No acute distress.. Vitals Time Taken: 8:22 AM, Height: 77 in, Weight: 280 lbs, BMI: 33.2, Temperature: 98.6 F, Pulse: 88 bpm, Respiratory Rate: 18 breaths/min, Blood Pressure: 156/96 mmHg, Capillary Blood Glucose: 79 mg/dl. General Notes: glucose per pt report this am Respiratory Normal work of breathing on room air.. General Notes: 12/22/2021: The dorsal ankle/foot wounds remain closed. The right plantar foot wound continues to contract. There is still a bit of depth at the lateral portion of the wound but the surface has a good granulated appearance. The wound on the left is about the same size, the central indented portion is still adherent. It also is clean with good granulation tissue present. The periwound skin and callus are a bit macerated, however. Integumentary (Hair, Skin) Wound #3 status is Open. Original cause of wound was Trauma. The date acquired was: 10/02/2019. The wound has been in treatment 112 weeks. The wound is located on the [place]. The wound measures 3.4cm length x 4.1cm width x 0.5cm depth; 10.948cm^2 area and 5.474cm^3 volume. There is Fat Layer (Subcutaneous Tissue) exposed. There is no tunneling or undermining noted. There is a medium amount of serosanguineous drainage noted. The wound margin is well defined and not attached to the wound base. There is large (67-100%) red, hyper - granulation within the wound bed. There is a small (1- 33%) amount of necrotic tissue within the wound bed including Adherent Slough. Wound #8 status is Open. Original cause of wound was Blister. The date acquired was: 04/18/2021. The wound has been in treatment 35 weeks. The wound is located on the  Right,Lateral Foot. The wound measures 2.1cm length x 2.2cm width x 0.9cm depth;  3.629cm^2 area and 3.266cm^3 volume. There is Fat Layer (Subcutaneous Tissue) exposed. There is no tunneling noted, however, there is undermining starting at 9:00 and ending at 2:00 with a maximum distance of 1cm. There is a medium amount of serosanguineous drainage noted. The wound margin is well defined and not attached to the wound base. There is large (67- 100%) red, pink granulation within the wound bed. There is a small (1-33%) amount of necrotic tissue within the wound bed including Adherent Slough. 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 There was a Selective/Open Wound Skin/Epidermis Debridement with a total area of 20 sq cm performed by [name], MD. With the following instrument(s): Curette to remove Non-Viable tissue/material. Material removed includes Callus and Skin: Epidermis and after achieving pain control using Lidocaine 4% Topical Solution. No specimens were taken. A time out was conducted at 08:55, prior to the start of the procedure. A Minimum amount of bleeding was controlled with Pressure. The procedure was tolerated well with a pain level of 0 throughout and a pain level of 0 following the procedure. Post Debridement Measurements: 3.4cm length x 4.1cm width x 0.5cm depth; 5.474cm^3 volume. Character of Wound/Ulcer Post Debridement is improved. Severity of Tissue Post Debridement is: Fat layer exposed. Post procedure Diagnosis Wound #3: Same as Pre-Procedure General Notes: scribed by Baruch Gouty,  RN for Dr. [name]. Wound #8 Pre-procedure diagnosis of Wound #8 is a Diabetic Wound/Ulcer of the Lower Extremity located on the Right,Lateral Foot .Severity of Tissue Pre Debridement is: Fat layer exposed. There was a Selective/Open Wound Non-Viable Tissue Debridement with a total area of 4.62 sq cm performed by [name], MD. With the following instrument(s): Curette to remove Non-Viable tissue/material. Material removed includes Callus and Slough and after achieving pain control using Lidocaine 4% T opical Solution. No specimens were taken. A time out was conducted at 08:55, prior to the start of the procedure. A Minimum amount of bleeding was controlled with Pressure. The procedure was tolerated well with a pain level of 0 throughout and a pain level of 0 following the procedure. Post Debridement Measurements: 2.1cm length x 2.2cm width x 0.9cm depth; 3.266cm^3 volume. Character of Wound/Ulcer Post Debridement is improved. Severity of Tissue Post Debridement is: Fat layer exposed. Post procedure Diagnosis Wound #8: Same as Pre-Procedure Plan Follow-up Appointments: Return Appointment in 1 week. - Dr. [name] - Room 1 with [facility] Wednesday 12/29/21 at 08:15 AM Bathing/ Shower/ Hygiene: May shower and wash wound with soap and water. Edema Control - Lymphedema / SCD / Other: Elevate legs to the level of the heart or above for 30 minutes daily and/or when sitting, a frequency of: - throughout the day Avoid standing for long periods of time. Exercise regularly Compression stocking or Garment 20-30 mm/Hg pressure to: - to both legs daily Off-Loading: Open toe surgical shoe to: - Both feet Additional Orders / Instructions: Follow Nutritious Diet The following medication(s) was prescribed: lidocaine topical 4 % cream cream topical for prior to debridement was prescribed at  facility WOUND #3: - Foot Wound Laterality: Plantar, Left, Lateral Cleanser: Soap and Water 1 x Per Day/30  Days Discharge Instructions: May shower and wash wound with dial antibacterial soap and water prior to dressing change. Peri-Wound Care: Zinc Oxide Ointment 30g tube 1 x Per Day/30 Days Discharge Instructions: Apply Zinc Oxide to periwound with each dressing change Peri-Wound Care: Sween Lotion (Moisturizing lotion) 1 x Per Day/30 Days Discharge Instructions: Apply moisturizing lotion as directed Prim Dressing: Promogran Prisma Matrix, 4.34 (sq in) (silver collagen) (Dispense As Written) 1 x Per Day/30 Days ary Discharge Instructions: Moisten collagen with saline or hydrogel Secondary Dressing: ABD Pad, 8x10 (Dispense As Written) 1 x Per Day/30 Days Discharge Instructions: Apply over primary dressing as directed. Secondary Dressing: Optifoam Non-Adhesive Dressing, 4x4 in (Dispense As Written) 1 x Per Day/30 Days Discharge Instructions: Apply over primary dressing as directed. Secondary Dressing: NonWoven Sponge, 4x4 (in/in) (Generic) 1 x Per Day/30 Days Secured With: 10M Medipore H Soft Cloth Surgical T ape, 4 x 10 (in/yd) (Generic) 1 x Per Day/30 Days Discharge Instructions: Secure with tape as directed. Com pression Wrap: Kerlix Roll 4.5x3.1 (in/yd) 1 x Per Day/30 Days Discharge Instructions: Apply Kerlix and Coban compression as directed. Com pression Wrap: 10M ACE Elastic Bandage With VELCRO Brand Closure, 4 (in) (Generic) 1 x Per Day/30 Days WOUND #8: - Foot Wound Laterality: Right, Lateral Cleanser: Soap and Water 1 x Per Day/30 Days Discharge Instructions: May shower and wash wound with dial antibacterial soap and water prior to dressing change. Peri-Wound Care: Sween Lotion (Moisturizing lotion) 1 x Per Day/30 Days Discharge Instructions: Apply moisturizing lotion as directed Prim Dressing: Promogran Prisma Matrix, 4.34 (sq in) (silver collagen) (Dispense As Written) 1 x Per Day/30 Days ary Discharge Instructions: Moisten collagen with saline or hydrogel Secondary Dressing: ABD Pad,  8x10 (Dispense As Written) 1 x Per Day/30 Days Discharge Instructions: Apply over primary dressing as directed. Secondary Dressing: Optifoam Non-Adhesive Dressing, 4x4 in (Dispense As Written) 1 x Per Day/30 Days Discharge Instructions: Apply over primary dressing as directed. Secondary Dressing: NonWoven Sponge, 4x4 (in/in) (Generic) 1 x Per Day/30 Days Secured With: 10M Medipore H Soft Cloth Surgical T ape, 4 x 10 (in/yd) (Generic) 1 x Per Day/30 Days Discharge Instructions: Secure with tape as directed. Com pression Wrap: Kerlix Roll 4.5x3.1 (in/yd) 1 x Per Day/30 Days Discharge Instructions: Apply Kerlix and Coban compression as directed. Com pression Wrap: 10M ACE Elastic Bandage With VELCRO Brand Closure, 4 (in) (Generic) 1 x Per Day/30 Days 12/22/2021: The dorsal ankle/foot wounds remain closed. The right plantar foot wound continues to contract. There is still a bit of depth at the lateral portion of the wound but the surface has a good granulated appearance. The wound on the left is about the same size, the central indented portion is still adherent. It also is clean with good granulation tissue present. The periwound skin and callus are a bit macerated, however. I used a curette to debride macerated skin and callus from the left wound, callus and slough from the right wound. We will continue to use Prisma silver collagen. We will apply zinc oxide to the periwound on the left to try and minimize the moisture accumulation. I think him being out of work has enabled him to appropriately offload and this is why we are seeing improvement in his wounds. Follow-up in 1 week. Electronic Signature(s) Signed: 12/22/2021 9:19:43 AM By: [name] MD FACS Entered By: [name] on 12/22/2021 09:19:43 -------------------------------------------------------------------------------- HxROS Details  Patient Name: Date of Service: [name] D 12/22/2021 8:15 A M Medical Record Number:  [id number] Patient Account Number: [id number] Date of Birth/Sex: Treating RN: 1986-02-06 (36 y.o. M) Primary Care Provider: [name] Other Clinician: Referring Provider: Treating Provider/Extender: [name] in Treatment: 67 Information Obtained From Patient Eyes Medical History: Negative for: Cataracts; Glaucoma; Optic Neuritis Ear/Nose/Mouth/Throat Medical History: Negative for: Chronic sinus problems/congestion; Middle ear problems Hematologic/Lymphatic Medical History: Negative for: Anemia; Hemophilia; Human Immunodeficiency Virus; Lymphedema; Sickle Cell Disease Respiratory Medical History: Negative for: Aspiration; Asthma; Chronic Obstructive Pulmonary Disease (COPD); Pneumothorax; Sleep Apnea; Tuberculosis Cardiovascular Medical History: Negative for: Angina; Arrhythmia; Congestive Heart Failure; Coronary Artery Disease; Deep Vein Thrombosis; Hypertension; Hypotension; Myocardial Infarction; Peripheral Arterial Disease; Peripheral Venous Disease; Phlebitis; Vasculitis Gastrointestinal Medical History: Negative for: Cirrhosis ; Colitis; Crohns; Hepatitis A; Hepatitis B; Hepatitis C Endocrine Medical History: Positive for: Type II Diabetes Negative for: Type I Diabetes Time with diabetes: 8 Treated with: Insulin Blood sugar tested every day: No Immunological Medical History: Negative for: Lupus Erythematosus; Raynauds; Scleroderma Integumentary (Skin) Medical History: Negative for: History of Burn Musculoskeletal Medical History: Negative for: Gout; Rheumatoid Arthritis; Osteoarthritis; Osteomyelitis Neurologic Medical History: Negative for: Dementia; Neuropathy; Quadriplegia; Paraplegia; Seizure Disorder Oncologic Medical History: Negative for: Received Chemotherapy; Received Radiation Psychiatric Medical History: Negative for: Anorexia/bulimia; Confinement Anxiety Immunizations Pneumococcal Vaccine: Received Pneumococcal  Vaccination: No Implantable Devices None Hospitalization / Surgery History Type of Hospitalization/Surgery 11/1-11/06/2018- sepsis foot infection 11/4-11/5 02 sats low respiratory distress Family and Social History Cancer: No; Diabetes: No; Hereditary Spherocytosis: No; Hypertension: No; Kidney Disease: No; Lung Disease: No; Seizures: No; Stroke: No; Thyroid Problems: No; Tuberculosis: No; Never smoker; Marital Status - Single; Alcohol Use: Rarely; Drug Use: No History; Caffeine Use: Never; Financial Concerns: No; Food, Clothing or Shelter Needs: No; Support System Lacking: No; Transportation Concerns: No Electronic Signature(s) Signed: 12/22/2021 12:13:04 PM By: [name] MD FACS Entered By: [name] on 12/22/2021 09:17:59 -------------------------------------------------------------------------------- SuperBill Details Patient Name: Date of Service: [name], [name] 12/22/2021 Medical Record Number: [id number] Patient Account Number: [id number] Date of Birth/Sex: Treating RN: 08-03-1985 (36 y.o. M) Primary Care Provider: [name] Other Clinician: Referring Provider: Treating Provider/Extender: [name] in Treatment: 112 Diagnosis Coding ICD-10 Codes Code Description E11.621 Type 2 diabetes mellitus with foot ulcer L97.528 Non-pressure chronic ulcer of other part of left foot with other specified severity L97.518 Non-pressure chronic ulcer of other part of right foot with other specified severity L97.322 Non-pressure chronic ulcer of left ankle with fat layer exposed Facility Procedures CPT4 Code: [id number] Description: [phone] - DEBRIDE WOUND 1ST 20 SQ CM OR < ICD-10 Diagnosis Description L97.528 Non-pressure chronic ulcer of other part of left foot with other specified severi L97.518 Non-pressure chronic ulcer of other part of right foot with other  specified sever Modifier: ty ity Quantity: 1 CPT4 Code: [id number] Description:  81017 - DEBRIDE WOUND EA ADDL 20 SQ CM ICD-10 Diagnosis Description L97.528 Non-pressure chronic ulcer of other part of left foot with other specified severi L97.518 Non-pressure chronic ulcer of other part of right foot with other specified  sever Modifier: ty ity Quantity: 1 Physician Procedures : CPT4 Code Description Modifier [id number] - WC PHYS LEVEL 3 - EST PT 25 ICD-10 Diagnosis Description L97.528 Non-pressure chronic ulcer of other part of left foot with other specified severity L97.518 Non-pressure chronic ulcer of other part of  right foot with other specified severity E11.621 Type 2 diabetes mellitus with foot  ulcer Quantity: 1 : [id number] 43601 - WC PHYS DEBR WO ANESTH 20 SQ CM ICD-10 Diagnosis Description L97.528 Non-pressure chronic ulcer of other part of left foot with other specified severity L97.518 Non-pressure chronic ulcer of other part of right foot with other specified  severity Quantity: 1 : [id number] 49494 - WC PHYS DEBR WO ANESTH EA ADD 20 CM ICD-10 Diagnosis Description L97.528 Non-pressure chronic ulcer of other part of left foot with other specified severity L97.518 Non-pressure chronic ulcer of other part of right foot with other  specified severity Quantity: 1 Electronic Signature(s) Signed: 12/22/2021 9:20:09 AM By: [name] MD FACS Entered By: [name] on 12/22/2021 09:20:08

## 2021-12-27 ENCOUNTER — Ambulatory Visit (INDEPENDENT_AMBULATORY_CARE_PROVIDER_SITE_OTHER): Payer: BC Managed Care – PPO | Admitting: Ophthalmology

## 2021-12-27 ENCOUNTER — Encounter (INDEPENDENT_AMBULATORY_CARE_PROVIDER_SITE_OTHER): Payer: Self-pay | Admitting: Ophthalmology

## 2021-12-27 ENCOUNTER — Encounter (INDEPENDENT_AMBULATORY_CARE_PROVIDER_SITE_OTHER): Payer: BC Managed Care – PPO | Admitting: Ophthalmology

## 2021-12-27 DIAGNOSIS — E103412 Type 1 diabetes mellitus with severe nonproliferative diabetic retinopathy with macular edema, left eye: Secondary | ICD-10-CM

## 2021-12-27 DIAGNOSIS — E103411 Type 1 diabetes mellitus with severe nonproliferative diabetic retinopathy with macular edema, right eye: Secondary | ICD-10-CM

## 2021-12-27 DIAGNOSIS — E103413 Type 1 diabetes mellitus with severe nonproliferative diabetic retinopathy with macular edema, bilateral: Secondary | ICD-10-CM

## 2021-12-27 NOTE — Assessment & Plan Note (Signed)
CSME improved post recent Avastin OS.  Delivery of focal laser treatment inferotemporal region to the fovea performed today in a graded fashion without difficulty.  Follow-up next in 4 months

## 2021-12-27 NOTE — Assessment & Plan Note (Signed)
Stable no recurrence of CSME

## 2021-12-27 NOTE — Progress Notes (Signed)
12/27/2021     CHIEF COMPLAINT Patient presents for  Chief Complaint  Patient presents with   Diabetic Retinopathy with Macular Edema      HISTORY OF PRESENT ILLNESS: [name] is a 36 y.o. male who presents to the clinic today for:   HPI   Diabetic retinopathy of left eye with macular edema 3 weeks dilate os oct focal Pt states his vision has been stable Pt denies any new floaters or FOL  Last edited by Aleene Davidson, CMA on 12/27/2021  3:05 PM.      Referring physician: [name], MD [address] [place],  [place] [address]  HISTORICAL INFORMATION:   Selected notes from the MEDICAL RECORD NUMBER    Lab Results  Component Value Date   HGBA1C 8.0 (H) 09/13/2021     CURRENT MEDICATIONS: No current outpatient medications on file. (Ophthalmic Drugs)   No current facility-administered medications for this visit. (Ophthalmic Drugs)   Current Outpatient Medications (Other)  Medication Sig   Continuous Blood Gluc Sensor (FREESTYLE LIBRE 14 DAY SENSOR) MISC Inject 1 patch into the skin every 14 (fourteen) days.   Continuous Blood Gluc Sensor MISC 1 each by Does not apply route as directed. Use as directed every 14 days. May dispense FreeStyle [employer] or similar.   Insulin Disposable Pump (OMNIPOD DASH 5 PACK PODS) MISC Inject into the skin.   insulin glargine (LANTUS SOLOSTAR) 100 UNIT/ML Solostar Pen Inject 40 Units into the skin daily before breakfast. (Patient taking differently: Inject 40-80 Units into the skin daily before breakfast. Sliding scale per patient)   No current facility-administered medications for this visit. (Other)      REVIEW OF SYSTEMS: ROS   Negative for: Constitutional, Gastrointestinal, Neurological, Skin, Genitourinary, Musculoskeletal, HENT, Endocrine, Cardiovascular, Eyes, Respiratory, Psychiatric, Allergic/Imm, Heme/Lymph Last edited by Erling Cruz D, CMA on 12/27/2021  3:05 PM.        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;  Surgeon: Toni Arthurs, MD;  Location: [facility];  Service: Orthopedics;  Laterality: Left;   AMPUTATION Right 10/22/2020   Procedure: Right Fifth ray amputation;  Surgeon: Toni Arthurs, MD;  Location: [facility];  Service: Orthopedics;  Laterality: Right;   I & D EXTREMITY Bilateral 10/30/2021   Procedure: DEBRIDEMENT AND GRAFT APPLICATION;  Surgeon: Edwin Cap, DPM;  Location: [facility] OR;  Service: Podiatry;  Laterality: Bilateral;   WOUND DEBRIDEMENT Bilateral 10/02/2019   Procedure: DEBRIDEMENT WOUNDS BOTH LOWER EXTREMITIES WITH BONE BIOPSIES;  Surgeon: Vivi Barrack, DPM;  Location: [facility] OR;  Service: Podiatry;  Laterality: Bilateral;   WOUND DEBRIDEMENT Right 10/27/2021   Procedure: RIGHT FOOT DEBRIDEMENT WOUND AND BONE BIOPSY;  Surgeon: Edwin Cap, DPM;  Location: [facility] OR;  Service: Podiatry;  Laterality: Right;    FAMILY HISTORY Family History  Problem Relation Age of Onset   Hypertension Other    Diabetes Other    Healthy Mother    Colon cancer Neg Hx    Stomach cancer Neg Hx    Pancreatic cancer Neg Hx    Esophageal cancer Neg  Hx     SOCIAL HISTORY Social History   Tobacco Use   Smoking status: Never   Smokeless tobacco: Never  Vaping Use   Vaping Use: Never used  Substance Use Topics   Alcohol use: No   Drug use: No         OPHTHALMIC EXAM:  Base Eye Exam     Visual Acuity (ETDRS)       Right Left   Dist ph [place] 20/30 +1 20/50 -1         Tonometry (Tonopen, 3:12 PM)       Right Left   Pressure 9 10          Pupils       Pupils   Right PERRL   Left PERRL         Visual Fields       Left Right    Full Full         Extraocular Movement       Right Left    Ortho Ortho    -- -- --  --  --  -- -- --   -- -- --  --  --  -- -- --           Neuro/Psych     Oriented x3: Yes   Mood/Affect: Normal         Dilation     Left eye: 2.5% Phenylephrine, 1.0% Mydriacyl @ 3:07 PM           Slit Lamp and Fundus Exam     External Exam       Right Left   External Normal Normal         Slit Lamp Exam       Right Left   Lids/Lashes Normal Normal   Conjunctiva/Sclera White and quiet White and quiet   Cornea Clear Clear   Anterior Chamber Deep and quiet Deep and quiet   Iris Round and reactive Round and reactive   Lens 2+ Nuclear sclerosis 2+ Nuclear sclerosis, sub anterior capsule opacity, 4+ Anterior cortical changes, individual axis   Anterior Vitreous Normal Normal         Fundus Exam       Right Left   Posterior Vitreous  Normal   Disc  Normal   C/D Ratio  0.5   Macula  Exudates, Microaneurysms, Macular thickening minor   Vessels  Severe nonproliferative diabetic retinopathy   Periphery  Normal            IMAGING AND PROCEDURES  Imaging and Procedures for 12/27/21  OCT, Retina - OU - Both Eyes       Right Eye Quality was good. Scan locations included subfoveal. Central Foveal Thickness: 248. Progression has been stable. Findings include normal foveal contour.   Left Eye Quality was good. Scan locations included subfoveal. Central Foveal Thickness: 249. Progression has improved. Findings include abnormal foveal contour, cystoid macular edema.   Notes New active CSME OS, recurrence of CSME inferotemporal to FAZ.  Focal applied inferotemporal without complication   OD with very small focal changes temporal to the fovea with good visual acuity can observe.     Focal Laser - OS - Left Eye       Anesthesia Topical  anesthesia was used. Anesthetic medications included Proparacaine 0.5%.   Laser Information The type of laser was diode. Color was yellow. The duration in seconds was 0.1. The spot size was 100 microns. Laser power was 50.  Total spots was 47.   Post-op The patient tolerated the procedure well. There were no complications. The patient received written and verbal post procedure care education.              ASSESSMENT/PLAN:  Severe nonproliferative diabetic retinopathy of left eye, with macular edema, associated with type 1 diabetes mellitus (HCC) CSME improved post recent Avastin OS.  Delivery of focal laser treatment inferotemporal region to the fovea performed today in a graded fashion without difficulty.  Follow-up next in 4 months  Severe nonproliferative diabetic retinopathy of right eye, with macular edema, associated with type 1 diabetes mellitus ([place]) Stable no recurrence of CSME      ICD-10-CM   1. Severe nonproliferative diabetic retinopathy of left eye, with macular edema, associated with type 1 diabetes mellitus (HCC)  E10.3412 OCT, Retina - OU - Both Eyes    Focal Laser - OS - Left Eye    2. Severe nonproliferative diabetic retinopathy of right eye, with macular edema, associated with type 1 diabetes mellitus ([place])  E10.3411       1.  Focal laser OS today delivered inferotemporal portion of macula.  Great fashion.  2.  3.  Ophthalmic Meds Ordered this visit:  No orders of the defined types were placed in this encounter.      Return in about 4 months (around 04/28/2022) for DILATE OU, OCT.  There are no Patient Instructions on file for this visit.   Explained the diagnoses, plan, and follow up with the patient and they expressed understanding.  Patient expressed understanding of the importance of proper follow up care.   Clent Demark Joua Bake M.D. Diseases & Surgery of the Retina and Vitreous [facility] 12/27/21     Abbreviations: M  myopia (nearsighted); A astigmatism; H hyperopia (farsighted); P presbyopia; Mrx spectacle prescription;  CTL contact lenses; OD right eye; OS left eye; OU both eyes  XT exotropia; ET esotropia; PEK punctate epithelial keratitis; PEE punctate epithelial erosions; DES dry eye syndrome; MGD meibomian gland dysfunction; ATs artificial tears; PFAT's preservative free artificial tears; [place] nuclear sclerotic cataract; PSC posterior subcapsular cataract; ERM epi-retinal membrane; PVD posterior vitreous detachment; RD retinal detachment; DM diabetes mellitus; DR diabetic retinopathy; NPDR non-proliferative diabetic retinopathy; PDR proliferative diabetic retinopathy; CSME clinically significant macular edema; DME diabetic macular edema; dbh dot blot hemorrhages; CWS cotton wool spot; POAG primary open angle glaucoma; C/D cup-to-disc ratio; HVF humphrey visual field; GVF goldmann visual field; OCT optical coherence tomography; IOP intraocular pressure; BRVO Branch retinal vein occlusion; CRVO central retinal vein occlusion; CRAO central retinal artery occlusion; BRAO branch retinal artery occlusion; RT retinal tear; SB scleral buckle; PPV pars plana vitrectomy; VH Vitreous hemorrhage; PRP panretinal laser photocoagulation; IVK intravitreal kenalog; VMT vitreomacular traction; MH Macular hole;  NVD neovascularization of the disc; NVE neovascularization elsewhere; AREDS age related eye disease study; ARMD age related macular degeneration; POAG primary open angle glaucoma; EBMD epithelial/anterior basement membrane dystrophy; ACIOL anterior chamber intraocular lens; IOL intraocular lens; PCIOL posterior chamber intraocular lens; Phaco/IOL phacoemulsification with intraocular lens placement; [place] photorefractive keratectomy; LASIK laser assisted in situ keratomileusis; HTN hypertension; DM diabetes mellitus; COPD chronic obstructive pulmonary disease

## 2021-12-29 ENCOUNTER — Encounter (HOSPITAL_BASED_OUTPATIENT_CLINIC_OR_DEPARTMENT_OTHER): Payer: BC Managed Care – PPO | Admitting: General Surgery

## 2021-12-29 DIAGNOSIS — E11621 Type 2 diabetes mellitus with foot ulcer: Secondary | ICD-10-CM | POA: Diagnosis not present

## 2021-12-29 NOTE — Progress Notes (Signed)
[name] ([id number]) Visit Report for 12/29/2021 Chief Complaint Document Details Patient Name: Date of Service: [name] 12/29/2021 8:15 A M Medical Record Number: [id number] Patient Account Number: [id number] Date of Birth/Sex: Treating RN: 1985/06/27 (36 y.o. M) Primary Care Provider: [name] Other Clinician: Referring Provider: Treating Provider/Extender: [name] in Treatment: [place] from: Patient Chief Complaint 01/11/2019; patient is here for review of a rather substantial wound over the left fifth plantar metatarsal head extending into the lateral part of his foot 10/24/2019; patient returns to clinic with wounds on his bilateral feet with underlying osteomyelitis biopsy-proven Electronic Signature(s) Signed: 12/29/2021 8:41:43 AM By: [name] MD FACS Entered By: [name] on 12/29/2021 08:41:43 -------------------------------------------------------------------------------- Debridement Details Patient Name: Date of Service: [name], [name] 12/29/2021 8:15 A M Medical Record Number: [id number] Patient Account Number: [id number] Date of Birth/Sex: Treating RN: 1985/05/27 (36 y.o. [name] Primary Care Provider: [name] Other Clinician: Referring Provider: Treating Provider/Extender: [name] in Treatment: 113 Debridement Performed for Assessment: Wound #8 Right,Lateral Foot Performed By: Physician [name], MD Debridement Type: Debridement Severity of Tissue Pre Debridement: Fat layer exposed Level of Consciousness (Pre-procedure): Awake and Alert Pre-procedure Verification/Time Out Yes - 08:36 Taken: Start Time: 08:36 T Area Debrided (L x W): otal 1.4 (cm) x 1.6 (cm) = 2.24 (cm) Tissue and other material debrided: Non-Viable, Callus, Biofilm Level: Non-Viable Tissue Debridement Description: Selective/Open Wound Instrument:  Curette Bleeding: Minimum Hemostasis Achieved: Pressure Procedural Pain: 0 Post Procedural Pain: 0 Response to Treatment: Procedure was tolerated well Level of Consciousness (Post- Awake and Alert procedure): Post Debridement Measurements of Total Wound Length: (cm) 1.4 Width: (cm) 1.6 Depth: (cm) 0.1 Volume: (cm) 0.176 Character of Wound/Ulcer Post Debridement: Improved Severity of Tissue Post Debridement: Fat layer exposed Post Procedure Diagnosis Same as Pre-procedure Electronic Signature(s) Signed: 12/29/2021 12:22:25 PM By: [name] MD FACS Signed: 12/29/2021 2:21:05 PM By: [name] RN, BSN Entered By: [name] on 12/29/2021 08:38:02 -------------------------------------------------------------------------------- Debridement Details Patient Name: Date of Service: [name], [name] 12/29/2021 8:15 A M Medical Record Number: [id number] Patient Account Number: [id number] Date of Birth/Sex: Treating RN: July 21, 1985 (36 y.o. [name] Primary Care Provider: [name] Other Clinician: Referring Provider: Treating Provider/Extender: [name] in Treatment: 113 Debridement Performed for Assessment: Wound #3 Left,Lateral,Plantar Foot Performed By: Physician [name], MD Debridement Type: Debridement Severity of Tissue Pre Debridement: Fat layer exposed Level of Consciousness (Pre-procedure): Awake and Alert Pre-procedure Verification/Time Out Yes - 08:36 Taken: Start Time: 08:36 T Area Debrided (L x W): otal 2.9 (cm) x 3.9 (cm) = 11.31 (cm) Tissue and other material debrided: Non-Viable, Callus, Biofilm Level: Non-Viable Tissue Debridement Description: Selective/Open Wound Instrument: Curette Bleeding: Minimum Hemostasis Achieved: Pressure Procedural Pain: 0 Post Procedural Pain: 0 Response to Treatment: Procedure was tolerated well Level of Consciousness (Post- Awake and Alert procedure): Post  Debridement Measurements of Total Wound Length: (cm) 2.9 Width: (cm) 3.9 Depth: (cm) 0.3 Volume: (cm) 2.665 Character of Wound/Ulcer Post Debridement: Improved Severity of Tissue Post Debridement: Fat layer exposed Post Procedure Diagnosis Same as Pre-procedure Electronic Signature(s) Signed: 12/29/2021 12:22:25 PM By: [name] MD FACS Signed: 12/29/2021 2:21:05 PM By: [name] RN, BSN Entered By: [name] on 12/29/2021 08:38:54 -------------------------------------------------------------------------------- HPI Details Patient Name: Date of Service: [name], [name] 12/29/2021 8:15 A M Medical Record Number: [id number] Patient Account Number: [id number] Date of Birth/Sex: Treating RN:  [birth year] (36 y.o. 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 He comes in for review of a wound over the plantar fifth metatarsal head extending into the lateral part of the foot. He was followed for this previously by his podiatrist Dr. Cornelius Moras. As the patient tells his story he went to see podiatry first for a swelling he developed on the lateral part of his fifth metatarsal head in May. He states this was "open" by podiatry and the area closed. He was followed up in June and it was again opened callus removed and it closed promptly. There were plans being made for surgery on the fifth metatarsal head in June however his blood sugar was apparently too high for anesthesia. Apparently the area was debrided and opened again in June and it is never closed since. Looking over the records from podiatry I am really not able to follow this. It was clear when he was first seen it was before 5/14 at that point he already had  a wound. By 5/17 the ulcer was resolved. I do not see anything about a procedure. On 5/28 noted to have pre-ulcerative moderate keratosis. X-ray noted 1/5 contracted toe and tailor's bunion and metatarsal deformity. On a visit date on 09/28/2018 the dorsal part of the left foot it healed and resolved. There was concern about swelling in his lower extremity he was sent to the ER.. As far as I can tell he was seen in the ER on 7/12 with an ulcer on his left foot. A DVT rule out of the left leg was negative. I do not think I have complete records from podiatry but I am not able to verify the procedures this patient states he had. He states after the last procedure the wound has never closed although I am not able to follow this in the records I have from podiatry. He has not had a recent x-ray The patient has been using Neosporin on the wound. He is wearing a Darco shoe. He is still very active up on his foot working and exercising. Past medical history; type 2 diabetes ketosis-prone, leg swelling with a negative DVT study in July. Non-smoker ABI in our clinic was 0.85 on the left 10/16; substantial wound on the plantar left fifth met head extending laterally almost to the dorsal fifth MTP. We have been using silver alginate we gave him a Darco forefoot off loader. An x-ray did not show evidence of osteomyelitis did note soft tissue emphysema which I think was due to gas tracking through an open wound. There is no doubt in my mind he requires an MRI 10/23; MRI not booked until 3 November at the earliest this is largely due to his glucose sensor in the right arm. We have been using silver alginate. There has been an improvement 10/29; I am still not exactly sure when his MRI is booked for. He says it is the third but it is the 10th in epic. This definitely needs to be done. He is running a low-grade fever today but no other symptoms. No real improvement in the 1 02/26/2019 patient presents today for a  follow-up visit here in our clinic he is last been seen in the clinic on October 29. Subsequently we were working on getting MRI to evaluate and  see what exactly was going on and where we would need to go from the standpoint of whether or not he had osteomyelitis and again what treatments were going be required. Subsequently the patient ended up being admitted to the hospital on 02/07/2019 and was discharged on 02/14/2019. This is a somewhat interesting admission with a discharge diagnosis of pneumonia due to COVID-19 although he was positive for COVID-19 when tested at the urgent care but negative x2 when he was actually in the hospital. With that being said he did have acute respiratory failure with hypoxia and it was noted he also have a left foot ulceration with osteomyelitis. With that being said he did require oxygen for his pneumonia and I level 4 L. He was placed on antivirals and steroids for the COVID-19. He was also transferred to the [facility] at one point. Nonetheless he did subsequently discharged home and since being home has done much better in that regard. The CT angiogram did not show any pulmonary embolism. With regard to the osteomyelitis the patient was placed on vancomycin and Zosyn while in the hospital but has been changed to Augmentin at discharge. It was also recommended that he follow- up with wound care and podiatry. Podiatry however wanted him to see [name] according to the patient prior to them doing anything further. His hemoglobin A1c was 9.9 as noted in the hospital. Have an MRI of the left foot performed while in the hospital on 02/04/2019. 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 There is an overlying plantar open wound noted an abscess tracking back along the lateral aspect of the fifth metatarsal shaft. There is otherwise diffuse cellulitis and mild fasciitis without  findings of polymyositis. The patient did have recently pneumonia secondary to COVID-19 I looked in the chart through epic and it does appear that the patient may need to have an additional x-ray just to ensure everything is cleared and that he has no airspace disease prior to putting him into the [name]. 03/05/2019; patient was readmitted to the clinic last week. He was hospitalized twice for a viral upper respiratory tract infection from 11/1 through 11/4 and then 11/5 through 11/12 ultimately this turned out to be Covid pneumonitis. Although he was discharged on oxygen he is not using it. He says he feels fine. He has no exercise limitation no cough no sputum. His O2 sat in our clinic today was 100% on room air. 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 He received Vanco and Zosyn in the hospital and then was discharged on 2 weeks of Augmentin. I do not see any relevant cultures. He was supposed to follow-up with infectious disease but I do not see that he has an appointment. 12/8; patient saw Dr. Novella Olive of infectious disease last week. He felt that he had had adequate antibiotic therapy. He did not go to follow-up with Dr. Amalia Hailey of podiatry and I have again talked to him about the pros and cons of this. He does not want to consider a ray amputation of this time. He is aware of the risks of recurrence, migration etc. He started HBO today and tolerated this well. He can complete the Augmentin that I gave him last week. I have looked over the lab work that Dr. Chana Bode ordered his C-reactive protein was  3.3 and his sedimentation rate was 17. The C-reactive protein is never really been measurably that high in this patient 12/15; not much change in the wound today however he has undermining along the lateral part of the foot again more extensively than last week. He has some rims of  epithelialization. We have been using silver alginate. He is undergoing hyperbarics but did not dive today 12/18; in for his obligatory first total contact cast change. Unfortunately there was pus coming from the undermining area around his fifth metatarsal head. This was cultured but will preclude reapplication of a cast. He is seen in conjunction with HBO 12/24; patient had staph lugdunensis in the wound in the undermining area laterally last time. We put him on doxycycline which should have covered this. The wound looks better today. I am going to give him another week of doxycycline before reattempting the total contact cast 12/31; the patient is completing antibiotics. Hemorrhagic debris in the distal part of the wound with some undermining distally. He also had hyper granulation. Extensive debridement with a #5 curette. The infected area that was on the lateral part of the fifth met head is closed over. I do not think he needs any more antibiotics. Patient was seen prior to HBO. Preparations for a total contact cast were made in the cast will be placed post hyperbarics 04/11/19; once again the patient arrives today without complaint. He had been in a cast all week noted that he had heavy drainage this week. This resulted in large raised areas of macerated tissue around the wound 1/14; wound bed looks better slightly smaller. Hydrofera Blue has been changing himself. He had a heavy drainage last week which caused a lot of maceration around the wound so I took him out of a total contact cast he says the drainage is actually better this week He is seen today in conjunction with HBO 1/21; returns to clinic. He was up in [place] for a day or 2 attending a funeral. He comes back in with the wound larger and with a large area of exposed bone. He had osteomyelitis and septic arthritis of the fifth left metatarsal head while he was in hospital. He received IV antibiotics in the hospital for a  prolonged period of time then 3 weeks of Augmentin. Subsequently I gave him 2 weeks of doxycycline for more superficial wound infection. When I saw this last week the wound was smaller the surface of the wound looks satisfactory. 1/28; patient missed hyperbarics today. Bone biopsy I did last time showed Enterococcus faecalis and Staphylococcus lugdunensis . He has a wide area of exposed bone. We are going to use silver alginate as of today. I had another ethical discussion with the patient. 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 In this situation I think the likelihood of healing this is low. Therefore I have recommended a ray amputation and with the patient's agreement I have referred him to Dr. Doran Durand. The other issue is that his compliance with hyperbarics has been minimal because of his work schedule and given his underlying decision I am going to stop this today READMISSION 10/24/2019 MRI 09/29/2019 left foot IMPRESSION: 1. Apparent skin ulceration inferior and lateral to the 5th metatarsal base with underlying heterogeneous T2 signal and enhancement in the subcutaneous fat. Small peripherally enhancing fluid collections along the plantar and lateral aspects of the 5th metatarsal base suspicious for abscesses. 2. Interval amputation through the mid 5th metatarsal with nonspecific low-level marrow  edema and enhancement. Given the proximity to the adjacent soft tissue inflammatory changes, osteomyelitis cannot be excluded. 3. The additional bones appear unremarkable. MRI 09/29/2019 right foot IMPRESSION: 1. Soft tissue ulceration lateral to the 5th MTP joint. There is low-level T2 hyperintensity within the 4th and 5th metatarsal heads and adjacent proximal phalanges without abnormal T1 signal or cortical destruction. These findings are nonspecific and could be seen with early marrow edema, hyperemia or early osteomyelitis. No evidence of septic  joint. 2. Mild tenosynovitis and synovial enhancement associated with the extensor digitorum tendons at the level of the midfoot. 3. Diffuse low-level muscular T2 hyperintensity and enhancement, most consistent with diabetic myopathy. LEFT FOOT BONE Methicillin resistant staphylococcus aureus Staphylococcus lugdunensis MIC MIC CIPROFLOXACIN >=8 RESISTANT Resistant <=0.5 SENSI... Sensitive CLINDAMYCIN <=0.25 SENS... Sensitive >=8 RESISTANT Resistant ERYTHROMYCIN >=8 RESISTANT Resistant >=8 RESISTANT Resistant GENTAMICIN <=0.5 SENSI... Sensitive <=0.5 SENSI... Sensitive Inducible Clindamycin NEGATIVE Sensitive NEGATIVE Sensitive OXACILLIN >=4 RESISTANT Resistant 2 SENSITIVE Sensitive RIFAMPIN <=0.5 SENSI... Sensitive <=0.5 SENSI... Sensitive TETRACYCLINE <=1 SENSITIVE Sensitive <=1 SENSITIVE Sensitive TRIMETH/SULFA <=10 SENSIT Sensitive <=10 SENSIT Sensitive ... Marland Kitchen.. VANCOMYCIN 1 SENSITIVE Sensitive <=0.5 SENSI... Sensitive Right foot bone . Component 3 wk ago Specimen Description BONE Special Requests RIGHT 4 METATARSAL SAMPLE B Gram Stain NO WBC SEEN NO ORGANISMS SEEN Culture RARE METHICILLIN RESISTANT STAPHYLOCOCCUS AUREUS NO ANAEROBES ISOLATED Performed at [facility], [place] [address], [place], [place] [address] Report Status 10/08/2019 FINAL Organism ID, Bacteria METHICILLIN RESISTANT STAPHYLOCOCCUS AUREUS Resulting Agency [facility] Susceptibility Methicillin resistant staphylococcus aureus MIC CIPROFLOXACIN >=8 RESISTANT Resistant CLINDAMYCIN <=0.25 SENS... Sensitive ERYTHROMYCIN >=8 RESISTANT Resistant GENTAMICIN <=0.5 SENSI... Sensitive Inducible Clindamycin NEGATIVE Sensitive OXACILLIN >=4 RESISTANT Resistant RIFAMPIN <=0.5 SENSI... Sensitive TETRACYCLINE <=1 SENSITIVE Sensitive TRIMETH/SULFA <=10 SENSIT Sensitive ... VANCOMYCIN 1 SENSITIVE Sensitive This is a patient we had in clinic earlier this year with a wound over his left fifth metatarsal head. 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 Doran Durand on 05/16/2019. He seems to have developed problems with wounds on his bilateral feet in June 2021 although he may have had problems earlier than this. He was in an urgent care with a right foot ulcer on 09/26/2019 and given a course of doxycycline. This was apparently after having trouble getting into see orthopedics. He was seen by podiatry on 09/28/2019 noted to have bilateral lower extremity ulcers including the left lateral fifth metatarsal base and the right subfifth met head. It was noted that had purulent drainage at that time. He required hospitalization from 6/20 through 7/2. This was because of worsening right foot wounds. He underwent bilateral operative incision and drainage and bone biopsies bilaterally. Culture results are listed above. He has been referred back to clinic by Dr. Jacqualyn Posey of podiatry. He is also followed by Dr. Megan Salon who saw him yesterday. He was discharged from hospital on Zyvox Flagyl and Levaquin and yesterday changed to doxycycline Flagyl and Levaquin. His inflammatory markers on 6/26 showed a sedimentation rate of 129 and a C-reactive protein of 5. This is improved to 14 and 1.3 respectively. This would indicate improvement. ABIs in our clinic today were 1.23 on the right and 1.20 on the left 11/01/2019 on evaluation today patient appears to be doing fairly well in  regard to the wounds on his feet at this point. Fortunately there is no signs of active infection at this time. No fevers, chills, nausea, vomiting, or diarrhea. He currently is seeing infectious disease and still under their care at this point. Subsequently he also has both wounds  which she has not been using collagen on as he did not receive that in his packaging he did not call us and let us know that. Apparently that just was missed on the order. Nonetheless we will get that straightened out today. 8/9-Patient returns for bilateral foot wounds, using Prisma with hydrogel moistened dressings, and the wounds appear stable. Patient using surgical shoes, avoiding much pressure or weightbearing as much as possible 8/16; patient has bilateral foot wounds. 1 on the right lateral foot proximally the other is on the left mid lateral foot. Both required debridement of callus and thick skin around the wounds. We have been using silver collagen 8/27; patient has bilateral lateral foot wounds. The area on the left substantially surrounded by callus and dry skin. This was removed from the wound edge. The underlying wound is small. The area on the right measured somewhat smaller today. 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 Unfortunately I did not update his antibiotics during today's visit. 9/10 I reviewed Dr. Hale Bogus last notes he felt he had completed antibiotics his inflammatory markers were reasonably well controlled. He has a small wound on the lateral left foot and a tiny area on the right which is just above closed. He is using Hydrofera Blue with border foam he has bilateral surgical shoes 9/24; 2 week f/u. doing well. right foot is closed. left foot still undermined. 10/14; right foot remains closed at the fifth met head. The area over the base of the left fifth metatarsal has a small open area but considerable undermining towards the plantar foot. Thick callus skin around this suggests an adequate pressure relief. We have talked about this. He says he is going to go back into his cam boot. I suggested a total contact cast he did not seem enamored with this suggestion 10/26; left foot base of the fifth  metatarsal. Same condition as last time. He has skin over the area with an open wound however the skin is not adherent. He went to see Dr. Earleen Newport who did an x-ray and culture of his foot I have not reviewed the x-ray but the patient was not told anything. He is on doxycycline 11/11; since the patient was last here he was in the emergency room on 10/30 he was concerned about swelling in the left foot. They did not do any cultures or x-rays. They changed his antibiotics to cephalexin. Previous culture showed group B strep. The cephalexin is appropriate as doxycycline has less than predictable coverage. Arrives in clinic today with swelling over this area under the wound. He also has a new wound on the right fifth metatarsal head 11/18; the patient has a difficult wound on the lateral aspect of the left fifth metatarsal head. The wound was almost ballotable last week I opened it slightly expecting to see purulence however there was just bleeding. I cultured this this was negative. X-ray unchanged. We are trying to get an MRI but I am not sure were going to be able to get this through his insurance. He also has an area on the right lateral fifth metatarsal head this looks healthier 12/3; the patient finally got our  MRI. Surprisingly this did not show osteomyelitis. I did show the soft tissue ulceration at the lateral plantar aspect of the fifth metatarsal base with a tiny residual 6 mm abscess overlying the superficial fascia I have tried to culture this area I have not been able to get this to grow anything. Nevertheless the protruding tissue looks aggravated. I suspect we should try to treat the underlying "abscess with broad-spectrum antibiotics. I am going to start him on Levaquin and Flagyl. He has much less edema in his legs and I am going to continue to wrap his legs and see him weekly 12/10. I started Levaquin and Flagyl on him last week. He just picked up the Flagyl apparently there was some delay.  The worry is the wound on the left fifth metatarsal base which is substantial and worsening. His foot looks like he inverts at the ankle making this a weightbearing surface. Certainly no improvement in fact I think the measurements of this are somewhat worse. We have been using 12/17; he apparently just got the Levaquin yesterday this is 2 weeks after the fact. He has completed the Flagyl. The area over the left fifth metatarsal base still has protruding granulation tissue although it does not look quite as bad as it did some weeks ago. He has severe bilateral lymphedema although we have not been treating him for wounds on his legs this is definitely going to require compression. There was so much edema in the left I did not wish to put him in a total contact cast today. I am going to increase his compression from 3-4 layer. The area on the right lateral fifth met head actually look quite good and superficial. 12/23; patient arrived with callus on the right fifth met head and the substantial hyper granulated callused wound on the base of his fifth metatarsal. He says he is completing his Levaquin in 2 days but I do not think that adds up with what I gave him but I will have to double check this. We are using Hydrofera Blue on both areas. My plan is to put the left leg in a cast the week after New Year's 04/06/2020; patient's wounds about the same. Right lateral fifth metatarsal head and left lateral foot over the base of the fifth metatarsal. There is undermining on the left lateral foot which I removed before application of total contact cast continuing with Hydrofera Blue new. Patient tells me he was seen by endocrinology today lab work was done [Dr. Kerr]. Also wondering whether he was referred to cardiology. I went over some lab work from previously does not have chronic renal failure certainly not nephrotic range proteinuria he does have very poorly controlled diabetes but this is not his most  updated lab work. Hemoglobin A1c has been over 11 1/10; the patient had a considerable amount of leakage towards mid part of his left foot with macerated skin however the wound surface looks better the area on the right lateral fifth met head is better as well. I am going to change the dressing on the left foot under the total contact cast to silver alginate, continue with Hydrofera Blue on the right. 1/20; patient was in the total contact cast for 10 days. Considerable amount of drainage although the skin around the wound does not look too bad on the left foot. The area on the right fifth metatarsal head is closed. Our nursing staff reports large amount of drainage out of the left lateral foot wound 1/25;  continues with copious amounts of drainage described by our intake staff. PCR culture I did last week showed E. coli and Enterococcus faecalis and low quantities. Multiple resistance genes documented including extended spectrum beta lactamase, MRSA, MRSE, quinolone, tetracycline. The wound is not quite as good this week as it was 5 days ago but about the same size 2/3; continues with copious amounts of malodorous drainage per our intake nurse. The PCR culture I did 2 weeks ago showed E. coli and low quantities of Enterococcus. There were multiple resistance genes detected. I put Neosporin on him last week although this does not seem to have helped. The wound is slightly deeper today. Offloading continues to be an issue here although with the amount of drainage she has a total contact cast is just not going to work 2/10; moderate amount of drainage. Patient reports he cannot get his stocking on over the dressing. I told him we have to do that the nurse gave him suggestions on how to make this work. The wound is on the bottom and lateral part of his left foot. Is cultured predominantly grew low amounts of Enterococcus, E. coli and anaerobes. There were multiple resistance genes detected including  extended spectrum beta lactamase, quinolone, tetracycline. I could not think of an easy oral combination to address this so for now I am going to do topical antibiotics provided by [facility] I think the main agents here are vancomycin and an aminoglycoside. We have to be able to give him access to the wounds to get the topical antibiotic on 2/17; moderate amount of drainage this is unchanged. He has his Keystone topical antibiotic against the deep tissue culture organisms. He has been using this and changing the dressing daily. Silver alginate on the wound surface. 2/24; using Keystone antibiotic with silver alginate on the top. He had too much drainage for a total contact cast at one point although I think that is improving and I think in the next week or 2 it might be possible to replace a total contact cast I did not do this today. In general the wound surface looks healthy however he continues to have thick rims of skin and subcutaneous tissue around the wide area of the circumference which I debrided 06/04/2020 upon evaluation today patient appears to be doing well in regard to his wound. I do feel like he is showing signs of improvement. There is little bit of callus and dead tissue around the edges of the wound as well as what appears to be a little bit of a sinus tract that is off to the side laterally I would perform debridement to clear that away today. 3/17; left lateral foot. The wound looks about the same as I remember. Not much depth surface looks healthy. No evidence of infection 3/25; left lateral foot. Wound surface looks about the same. Separating epithelium from the circumference. There really is no evidence of infection here however not making progress by my view 3/29; left lateral foot. Surface of the wound again looks reasonably healthy still thick skin and subcutaneous tissue around the wound margins. There is no evidence of infection. One of the concerns being brought up by the  nurses has again the amount of drainage vis--vis continued use of a total contact cast 4/5; left lateral foot at roughly the base of the fifth metatarsal. Nice healthy looking granulated tissue with rims of epithelialization. The overall wound measurements are not any better but the tissue looks healthy. The only concern is  the amount of drainage although he has no surrounding maceration with what we have been doing recently to absorb fluid and protect his skin. He also has lymphedema. 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 He states that the kerlix/coban under the TCC rolled down and could not get it back up. He has been using an offloading scooter and has somehow hurt his right foot using this device. This happened last week. He states that the side of his right foot developed a blister and opened. The top of his foot also has a few small open wounds he thinks is due to his socks rubbing in his shoes. He has not been using any dressings to the wound. He denies purulent drainage, fever/chills or erythema to the wounds. 4/22; patient presents for 1 week follow-up. He developed new wounds to the right foot that were evaluated at last clinic visit. He continues to have a total contact cast to the left leg and he reports no issues. He has been using silver collagen to the right foot wounds with no issues. He denies purulent drainage, fever/chills or erythema to the right foot wounds. He has no complaints today 4/25; patient presents for 1 week follow-up. He has a total contact cast of the left leg and reports no issues. He has been using silver alginate to the right foot wound. He denies purulent drainage, fever/chills or erythema to the right foot  wounds. 5/2 patient presents for 1 week follow-up. T contact cast on the left. The wound which is on the base of the plantar foot at the base of the fifth metatarsal otal actually looks quite good and dimensions continue to gradually contract. HOWEVER the area on the right lateral fifth metatarsal head is much larger than what I remember from 2 weeks ago. Once more is he has significant levels of hypergranulation. Noteworthy that he had this same hyper granulated response on his wound on the left foot at one point in time. So much so that he I thought there was an underlying fluid collection. Based on this I think this just needs debridement. 5/9; the wound on the left actually continues to be gradually smaller with a healthy surface. Slight amount of drainage and maceration of the skin around but not too bad. However he has a large wound over the right fifth metatarsal head very much in the same configuration as his left foot wound was initially. I used silver nitrate to address the hyper granulated tissue no mechanical debridement 5/16; area on the left foot did not look as healthy this week deeper thick surrounding macerated skin and subcutaneous tissue. The area on the right foot fifth met head was about the same The area on the right ankle that we identified last week is completely broken down into an open wound presumably a stocking rubbing issue 5/23; patient has been using a total contact cast to the left side. He has been using silver alginate underneath. He has also been using silver alginate to the right foot wounds. He has no complaints today. He denies any signs of infection. 5/31; the left-sided wound looks some better measure smaller surface granulation looks better. We have been using silver alginate under the total contact cast The  large area on his right fifth met head and right dorsal foot look about the same still using silver alginate 6/6; neither side is good as I was hoping  although the surface area dimensions are better. A lot of maceration on his left and right foot around the wound edge. Area on the dorsal right foot looks better. He says he was traveling. I am not sure what does the amount of maceration around the plantar wounds may be drainage issues 6/13; in general the wound surfaces look quite good on both sides. Macerated skin and raised edges around the wound required debridement although in general especially on the left the surface area seems improved. The area on the right dorsal ankle is about the same I thought this would not be such a problem to close 6/20; not much change in either wound although the one on the right looks a little better. Both wounds have thick macerated edges to the skin requiring debridements. We have been using silver alginate. The area on the dorsal right ankle is still open I thought this would be closed. 6/28; patient comes in today with a marked deterioration in the right foot wound fifth met head. Wide area of exposed bone this is a drastic change from last time. The area on the left there we have been casting is stagnant. We have been using silver alginate in both wound areas. 7/5; bone culture I did for PCR last time was positive for Pseudomonas, group B strep, Enterococcus and Staph aureus. There was no suggestion of methicillin resistance or ampicillin resistant genes. This was resistant to tetracycline however He comes into the clinic today with the area over his right plantar fifth metatarsal head which had been doing so well 2 weeks ago completely necrotic feeling bone. I do not know that this is going to be salvageable. The left foot wound is certainly no smaller but it has a better surface and is superficial. 7/8; patient called in this morning to say that his total contact cast was rubbing against his foot. He states he is doing fine overall. He denies signs of infection. 7/12; continued deterioration in the wound  over the right fifth metatarsal head crumbling bone. This is not going to be salvageable. The patient agrees and wants to be referred to Dr. Doran Durand which we will attempt to arrange as soon as possible. I am going to continue him on antibiotics as long as that takes so I will renew those today. The area on the left foot which is the base of the fifth metatarsal continues to look somewhat better. Healthy looking tissue no depth no debridement is necessary here. 7/20; the patient was kindly seen by Dr. Doran Durand of orthopedics on 10/19/2020. 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 Towards this end we have taken him out of the total contact cast on the left we will put him in a wrap with Hydrofera Blue. As I understand things surgery is planned for 7/21 7/27; patient had his surgery last Thursday. 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 Apparently everything went well we did not still disturb that today The area on the left foot actually looks quite good. He has been much less mobile which probably explains this he did not seem to do well in the total contact cast secondary to drainage and maceration I think. We have been using Hydrofera  Blue 11/09/2020 upon evaluation today patient appears to be doing well with regard to his plantar foot ulcer on the left foot. Fortunately there is no evidence of active infection at this time. No fevers, chills, nausea, vomiting, or diarrhea. Overall I think that he is actually doing extremely well. Nonetheless I do believe that he is staying off of this more following the surgery in his right foot that is the reason the left is doing so great. 8/16; left plantar foot wound. This looks smaller than the last time I saw this he is using Hydrofera Blue. The surgical wound on the right foot is being followed by Dr. Doran Durand we did not look at this today. He has surgical shoes on both  feet 8/23; left plantar foot wound not as good this week. Surrounding macerated skin and subcutaneous tissue everything looks moist and wet. I do not think he is offloading this adequately. He is using a surgical shoe Apparently the right foot surgical wound is not open although I did not check his foot 8/31; left plantar foot lateral aspect. Much improved this week. He has no maceration. Some improvement in the surface area of the wound but most impressively the depth is come in we are using silver alginate. The patient is a Product/process development scientist. He is asked that we write him a letter so he can go back to work. I have also tried to see if we can write something that will allow him to limit the amount of time that he is on his foot at work. 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 Hopefully the latter can be avoided 9/6; I believe the patient missed an appointment last week. He arrives in today with a wound looking roughly the same certainly no better. Undermining laterally and also inferiorly. We used molecuLight today in training with the patient's permission.. We are using silver alginate 9/21 wound is measuring bigger this week although this may have to do with the aggressive circumferential debridement last week in response to the blush fluorescence on the MolecuLight. Culture I did last week showed significant MSSA and E. coli. I put him on Augmentin but he has not started it yet. We are also going to send this for compounded antibiotics at [facility]. There is no evidence of systemic infection 9/29; silver alginate. His Keystone arrived. He is completing Augmentin in 2 days. Offloading in a cam boot. Moderate drainage per our intake staff 10/5; using silver alginate. He has been using his [place]. He has completed his Augmentin. Per our intake nurse still a lot of drainage, far too much to consider a total  contact cast. Wound measures about the same. He had the same undermining area that I defined last week from a roughly 11-3. I remove this today 10/12; using silver alginate he is using the [place]. He comes in for a nurse visit hence we are applying [employer] twice a week. Measuring slightly better today and less notable drainage. Extensive debridement of the wound edge last time 10/18; using topical Keystone and silver alginate and a soft cast. Wound measurements about the same. Drainage was through his soft cast. We are changing this twice a week Tuesdays and Friday 10/25; comes in with moderate drainage. Still using Keystone silver alginate and a soft cast. Wound dimensions completely the same.He has a lot of edema in the left leg he has lymphedema. Asking for [name]  to consider wrapping him as he cannot get his stocking on over the soft cast 11/2; comes in with moderate to large drainage slightly smaller in terms of width we have been using [place]. His wound looks satisfactory but not much improvement 11/4; patient presents today for obligatory cast change. Has no issues or complaints today. He denies signs of infection. 11/9; patient traveled this weekend to [place], was on the cast quite a bit. Staining of the cast with black material from his walking boot. Drainage was not quite as bad as we feared. Using silver alginate and Keystone 11/16; we do not have size for cast therefore we have been putting a soft cast on him since the change on Friday. Still a significant amount of drainage necessitating changing twice a week. We have been using the Keystone at cast changes either hard or soft as well as silver alginate Comes in the clinic with things actually looking fairly good improvement in width. He says his offloading is about the same 02/24/2021 upon evaluation today patient actually comes back in and is doing excellent in regard to his foot ulcer this is significantly smaller even compared to the  last visit. The soft cast seems to have done extremely well for him which is great news. I do not see any signs of infection minimal debridement will be needed today. 11/30; left lateral foot much improved half a centimeter improvement in surface area. No evidence of infection. He seems to be doing better with the soft cast in the TCC therefore we will continue with this. He comes back in later in the week for a change with the nurses. This is due to drainage 12/6; no improvement in dimensions. Under illumination some debris on the surface we have been using silver alginate, soft cast. If there is anything optimistic here he seems to have have less drainage 12/13. Dimensions are improved both length and width and slightly in depth. Appears to be quite healthy today. Raised edges of this thick skin and callus around the edges however. He is in a soft cast were bringing him back once for a change on Friday. Drainage is better 12/20. Dimensions are improved. He still has raised edges of thick skin and callus around the edges. We are using a soft cast 12/28; comes in today with thick callus around the wound. Using silver under alginate under a soft cast. I do not think there is much improvement in any measurement 2023 04/06/2021; patient was put in a total contact cast. Unfortunately not much change in surface area 1/10; not much different still thick callus and skin around the edge in spite of the total contact cast. This was just debrided last week we have been using the [facility] compounded antibiotic and silver alginate under a total contact cast 1/18 the patient's wound on the left side is doing nicely. smaller HOWEVER he comes in today with a wound on the right foot laterally. blister most likely serosangquenous drainage 1/24; the patient continues to do well in terms of the plantar left foot which is continued to contract using silver alginate under the total contact cast HOWEVER the right lateral  foot is bigger with denuded skin around the edges. I used pickups and a #15 scalpel to remove this this looks like the remanence of a large blister. Cannot rule out infection. Culture in this area I did last week showed Staphylococcus lugdunensis few colonies. I am going to try to address this with his [employer] antibiotic that is  done so well on the left having linezolid and this should cover the staph 2/1; the patient's wound on his left foot which was the original plantar foot wound thick skin and eschar around the edges even in the total contact cast but the wound surface does not look too bad The real problem is on how his right lateral foot at roughly the base of the fifth metatarsal. The wound is completely necrotic more worrisome than that there is swelling around the edges of this. We have been using silver alginate on both wounds and Keystone on the right foot. Unfortunately I think he is going to require systemic antibiotics while we await cultures. He did not get the x-ray done that we ordered last week [lost the prescription 2/7; disappointingly in the area on the left foot which we are treating with a total contact cast is still not closed although it is much smaller. He continues to have a lot of callus around the wound edge. -Right lateral foot culture I did last week was negative x-ray also negative for osteomyelitis. 2/15: TCC silver alginate on the left and silver alginate on the right lateral. No real improvement in either area 05/26/2021: T oday, the wounds are roughly the same size as at his previous visit, post-debridement. He continues to endorse fairly substantial drainage, particularly on the right. He has been in a total contact cast on the left. There is still some callus surrounding this lesion. On the right, the periwound skin is quite macerated, along with surrounding callus. The center of the right-sided wound also has some dark, densely adherent material, which is very  difficult to remove. 06/02/2021: Today, both wounds are slightly smaller. He has been using zinc oxide ointment around the right ulcer and the degree of maceration has improved markedly. There continues to be an area of nonviable tissue in the center of the right sided ulcer. The left-sided wound, which has been in the total contact cast. Appears clean and the degree of callus around it is less than previously. 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 He had to take the stairs and both wounds have increased in size. The left foot, which has been in a total contact cast, has developed a tunnel tracking to the lateral aspect of his foot. The nonviable tissue in the center of the right-sided ulcer remains recalcitrant to debridement. There is significant undermining surrounding the entirety of the left sided wound. 06/16/2021: The elevator at school has been fixed and the patient has been able to avoid putting as much weight on his wounds over the past week. We converted the left foot wound into a single lesion today, but despite this, the wound is actually smaller. The base is healthy with limited periwound callus. On the right, the central necrotic area is still present. He continues to be quite macerated around the right sided wound, despite applying barrier cream. This does, however, have the benefit of softening the callus to make it more easily removable. 06/23/2021: Today, the left wound is smaller. The lateral aspect that had opened up previously is now closed. The wound base has a healthy bed of granulation tissue and minimal slough. Unfortunately, on the right, the wound is larger and continues to be fairly macerated. He has also reopened the wound at his right ankle. He thinks this is due to the gait he has adopted secondary to his total contact cast and boot. 06/30/2021: T  oday, both wounds are a little bit larger. The lateral  aspect on the left has remained closed. He continues to have significant periwound maceration. The culture that I took from the right sided wound grew a population of bacteria that is not covered by his current [facility] antibiotic. The center of the right- sided wound continues to appear necrotic with nonviable fat. It probes deeper today, but does not reach bone. 07/07/2021: The periwound maceration is a little bit less today. The right lateral foot wound has some areas that appear more viable and the necrotic center also looks a little bit better. The wound on the dorsal surface of his right foot near the ankle is contracting and the surface appears healthy. The left plantar wound surface looks healthy, but there is some new undermining on the medial portion. He did get his new Keystone antibiotic and began applying that to the right foot wound on Saturday. 07/14/2021: The intake nurse reported substantial drainage from his wounds, but the periwound skin actually looks better than is typical for him. The wound on the dorsal surface of his right foot near the ankle is smaller and just has a small open area underneath some dried eschar. The left plantar wound surface looks healthy and there has been no significant accumulation of callus. The right lateral foot wound looks quite a bit better, with the central portion, which typically appears necrotic, looking more viable albeit pale. 07/22/2021: His left foot is extremely macerated today. The wound is about the same size. The wound on the dorsal surface of his right foot near the ankle had closed, but he traumatized it removing the dressing and there is a tiny skin tear in that location. The right lateral foot wound is bigger, but the surface appears healthy. 07/30/2021: The wound on the dorsal surface of his right foot near the ankle is closed. The right lateral foot wound again is a little bit bigger due to some undermining. The periwound skin is in  better condition, however. He has been applying zinc oxide. The wound surface is a little bit dry today. On the left, he does not have the substantial maceration that we frequently see. The wound itself is smaller and has a clean surface. 08/06/2021: Both wounds seem to have deteriorated over the past week. The right lateral foot wound has a dry surface but the periwound is boggy.. Overall wound dimensions are about the same. On the left, the wound is about the same size, but there is more undermining present underneath periwound callus. 08/13/2021: The right sided wound looks about the same, but on the left there has been substantial deterioration. The undermining continues to extend under periwound callus. Once this was removed, substantial extension of the wound was present. There is no odor or purulent drainage but clearly the wounds have broken down. 08/20/2021: The wounds look about the same today. He has been out of his total contact cast and has just been changing the dressings using topical Keystone with PolyMem Ag, Kerlix and Ace bandages. The wound on the top of his right ankle has reopened but this is quite small. There was a little bit of purulent material that I expressed when examining this wound. 08/24/2021: After the aggressive debridement I performed at his last visit, the wounds actually look a little bit better today. They are smaller with the exception of the wound on the top of his right ankle which is a little bit bigger as some more skin pulled off when  he was changing his dressing. We are using topical Keystone with PolyMem Ag Kerlix and Ace bandages. 09/02/2021: There has been really no change to any of his wounds. 09/16/2021: The patient was hospitalized last week with nausea, vomiting, and dehydration. He says that while he was in the hospital, his wounds were not really addressed properly. T oday, both plantar foot wounds are larger and the periwound skin is macerated. The wound  on the dorsum of his right foot has a scab on the top. The right foot now has a crater where previously he had had nonviable fat. It looks as though this simply died and fell out. The periwound callus is wet. 09/24/2021: His wounds have deteriorated somewhat since his last visit. The wound on the dorsum of his right foot near his ankle is larger and has more nonviable tissue present. The crater in his right foot is even deeper; I cannot quite palpate or probe to bone but I am sure it is close. The wound on his left plantar foot has an odd boggy area in the center that almost feels as though it has fluid within it. He has run out of his topical Keystone antibiotic. We are using silver alginate on his wounds. 09/29/2021: He has developed a new wound on the dorsum of his left foot near his ankle. He says he thinks his wrapping is rubbing in that site. I would concur with this as the wound on his right ankle is larger. The left foot looks about the same. The right foot has the crater that was present last week. No significant slough accumulation, but his foot remains quite swollen and warm despite oral antibiotic therapy. 10/08/2021: All of his wounds look about the same as last week. He did not start his oral antibiotics that are prescribed until just a couple of days ago; his [employer] compounded antibiotics formula has been changed and he is awaiting delivery of the new recipe. His MRI that was scheduled for earlier this week was canceled as no prior authorization had been obtained; unfortunately the tech responsible sent an email to my old [place] [employer] email, which I no longer use nor have access to. 10/18/2021: The wounds on his bilateral dorsal feet near the ankles are both improved. They are smaller and have just some eschar and slough buildup. The left plantar wound has a fair amount of undermining, but the surface is clean. There is some periwound callus accumulation. On the right plantar foot, there  is nonviable fat leading to a deep tunnel that tracks towards his dorsal medial foot. There is periwound callus and slough accumulation, as well. His right foot and leg remain swollen as compared to the left. 10/25/2021: The wounds on his bilateral dorsal feet and at the ankles have broken down somewhat. They are little bit larger than last week. The left plantar wound continues to undermine laterally but the surface is clean. The right plantar foot wound shows some decreased depth in the tunnel tracking towards his dorsal medial foot. He has not yet had the Doppler study that I ordered; it sounds like there is some confusion about the scheduling of the procedure. In addition, the MRI was denied and I have taken steps to appeal the denial. 11/24/2021: Since our last visit, Mr. Minton was admitted to the hospital where an MRI suggested osteomyelitis. He was taken the operating room by podiatry. Bone biopsies were negative for osteomyelitis. They debrided his wounds and applied myriad matrix. He saw them last  week and they removed his staples. He is here today to continue his wound healing process. T oday, both of the dorsal foot/ankle wounds are substantially smaller. There is just a little eschar overlying the left sided wound and some eschar and slough on the right. The right plantar foot ulcer has the healthiest surface of granulation tissue that I have seen to date. A portion of the myriad matrix failed to take and was hanging loose. It appears that myriad morcells were placed into the tunnel closest to the dorsal portion of his foot. These have sloughed off. The left plantar foot ulcer is about the same size, but has a much healthier surface than in the past. Both plantar ulcers have callus and slough accumulation. 12/07/2021: Left dorsal foot/ankle wound is closed. The right dorsal foot/ankle wound is nearly closed and just has a small open area with some eschar and slough. The right plantar foot  wound has contracted quite a bit since our last visit. It has a healthy surface with just a little bit of slough accumulation and periwound callus buildup. The left plantar foot wound is about the same size but the surface appears healthy. There is a little slough and periwound callus on this side, as well. 12/15/2021: Both dorsal foot/ankle wounds are closed. The right plantar foot wound is substantially smaller than at our last visit. The tunneling that was present has nearly closed. There is just a little bit of slough buildup. The left plantar foot wound is also a little bit smaller today. The surface is the healthiest that I have ever seen it. Light slough and periwound callus accumulation on this side. 12/22/2021: The dorsal ankle/foot wounds remain closed. The right plantar foot wound continues to contract. There is still a bit of depth at the lateral portion of the wound but the surface has a good granulated appearance. The wound on the left is about the same size, the central indented portion is still adherent. It also is clean with good granulation tissue present. The periwound skin and callus are a bit macerated, however. 12/29/2021: Both ankle wounds remain closed. The right plantar foot wound is less than half the size that it was last week. There is no depth any longer and the surface has just a little bit of slough and periwound callus. On the left, the wound is not much smaller, but the surface is healthier with good granulation tissue. There is also a little slough and periwound callus buildup. Electronic Signature(s) Signed: 12/29/2021 8:43:26 AM By: [name] MD FACS Entered By: [name] on 12/29/2021 08:43:26 -------------------------------------------------------------------------------- Physical Exam Details Patient Name: Date of Service: [name], [name] 12/29/2021 8:15 A M Medical Record Number: [id number] Patient Account Number: [id number] Date of Birth/Sex:  Treating RN: 1985-07-09 (36 y.o. M) Primary Care Provider: [name] Other Clinician: Referring Provider: Treating Provider/Extender: [name] in Treatment: 113 Constitutional . Slightly tachycardic, asymptomatic. . . No acute distress.Marland Kitchen Respiratory Normal work of breathing on room air.. Notes 12/29/2021: Both ankle wounds remain closed. The right plantar foot wound is less than half the size that it was last week. There is no depth any longer and the surface has just a little bit of slough and periwound callus. On the left, the wound is not much smaller, but the surface is healthier with good granulation tissue. There is also a little slough and periwound callus buildup. Electronic Signature(s) Signed: 12/29/2021 8:46:43 AM By: [name] MD FACS Entered By: [name],  Anderson Malta on 12/29/2021 08:46:43 -------------------------------------------------------------------------------- Physician Orders Details Patient Name: Date of Service: [name] 12/29/2021 8:15 A M Medical Record Number: [id number] Patient Account Number: [id number] Date of Birth/Sex: Treating RN: 01/28/1986 (36 y.o. [name] Primary Care Provider: [name] Other Clinician: Referring Provider: Treating Provider/Extender: [name] in Treatment: [phone] Verbal / Phone Orders: No Diagnosis Coding ICD-10 Coding Code Description E11.621 Type 2 diabetes mellitus with foot ulcer L97.528 Non-pressure chronic ulcer of other part of left foot with other specified severity L97.518 Non-pressure chronic ulcer of other part of right foot with other specified severity Follow-up Appointments ppointment in 1 week. - Dr. [name] - Room 1 with [name] Return A 01/05/22 @ 8:15 Bathing/ Shower/ Hygiene May shower and wash wound with soap and water. Edema Control - Lymphedema / SCD / Other Bilateral Lower Extremities Elevate legs to the level of the  heart or above for 30 minutes daily and/or when sitting, a frequency of: - throughout the day Avoid standing for long periods of time. Exercise regularly Compression stocking or Garment 20-30 mm/Hg pressure to: - to both legs daily Off-Loading Open toe surgical shoe to: - Both feet Additional Orders / Instructions Follow Nutritious Diet Wound Treatment Wound #3 - Foot Wound Laterality: Plantar, Left, Lateral Cleanser: Soap and Water 1 x Per Day/30 Days Discharge Instructions: May shower and wash wound with dial antibacterial soap and water prior to dressing change. Peri-Wound Care: Zinc Oxide Ointment 30g tube 1 x Per Day/30 Days Discharge Instructions: Apply Zinc Oxide to periwound with each dressing change Peri-Wound Care: Sween Lotion (Moisturizing lotion) 1 x Per Day/30 Days Discharge Instructions: Apply moisturizing lotion as directed Prim Dressing: Promogran Prisma Matrix, 4.34 (sq in) (silver collagen) (DME) (Generic) 1 x Per Day/30 Days ary Discharge Instructions: Moisten collagen with saline or hydrogel Secondary Dressing: ABD Pad, 8x10 (DME) (Generic) 1 x Per Day/30 Days Discharge Instructions: Apply over primary dressing as directed. Secondary Dressing: Optifoam Non-Adhesive Dressing, 4x4 in (DME) (Generic) 1 x Per Day/30 Days Discharge Instructions: Apply over primary dressing as directed. Secondary Dressing: NonWoven Sponge, 4x4 (in/in) (Generic) 1 x Per Day/30 Days Secured With: 768M Medipore H Soft Cloth Surgical T ape, 4 x 10 (in/yd) (Generic) 1 x Per Day/30 Days Discharge Instructions: Secure with tape as directed. Compression Wrap: Kerlix Roll 4.5x3.1 (in/yd) (DME) (Generic) 1 x Per Day/30 Days Discharge Instructions: Apply Kerlix and Coban compression as directed. Compression Wrap: 768M ACE Elastic Bandage With VELCRO Brand Closure, 4 (in) (DME) (Generic) 1 x Per Day/30 Days Wound #8 - Foot Wound Laterality: Right, Lateral Cleanser: Soap and Water 1 x Per Day/30  Days Discharge Instructions: May shower and wash wound with dial antibacterial soap and water prior to dressing change. Peri-Wound Care: Sween Lotion (Moisturizing lotion) 1 x Per Day/30 Days Discharge Instructions: Apply moisturizing lotion as directed Prim Dressing: Promogran Prisma Matrix, 4.34 (sq in) (silver collagen) (DME) (Generic) 1 x Per Day/30 Days ary Discharge Instructions: Moisten collagen with saline or hydrogel Secondary Dressing: ABD Pad, 8x10 (DME) (Generic) 1 x Per Day/30 Days Discharge Instructions: Apply over primary dressing as directed. Secondary Dressing: Optifoam Non-Adhesive Dressing, 4x4 in (DME) (Generic) 1 x Per Day/30 Days Discharge Instructions: Apply over primary dressing as directed. Secondary Dressing: NonWoven Sponge, 4x4 (in/in) (Generic) 1 x Per Day/30 Days Secured With: 768M Medipore H Soft Cloth Surgical T ape, 4 x 10 (in/yd) (Generic) 1 x Per Day/30 Days Discharge Instructions: Secure with tape as directed. Compression Wrap:  Kerlix Roll 4.5x3.1 (in/yd) (DME) (Generic) 1 x Per Day/30 Days Discharge Instructions: Apply Kerlix and Coban compression as directed. Compression Wrap: 32M ACE Elastic Bandage With VELCRO Brand Closure, 4 (in) (DME) (Generic) 1 x Per Day/30 Days Electronic Signature(s) Signed: 12/29/2021 12:22:25 PM By: [name] MD FACS Entered By: [name] on 12/29/2021 08:46:58 -------------------------------------------------------------------------------- Problem List Details Patient Name: Date of Service: [name], [name] 12/29/2021 8:15 A M Medical Record Number: [id number] Patient Account Number: [id number] Date of Birth/Sex: Treating RN: 1985/08/22 (36 y.o. M) Primary Care Provider: [name] Other Clinician: Referring Provider: Treating Provider/Extender: [name] in Treatment: 113 Active Problems ICD-10 Encounter Code Description Active Date MDM Diagnosis E11.621 Type 2 diabetes  mellitus with foot ulcer 10/24/2019 No Yes L97.528 Non-pressure chronic ulcer of other part of left foot with other specified 10/24/2019 No Yes severity L97.518 Non-pressure chronic ulcer of other part of right foot with other specified 10/24/2019 No Yes severity Inactive Problems ICD-10 Code Description Active Date Inactive Date L97.518 Non-pressure chronic ulcer of other part of right foot with other specified severity 07/14/2020 07/14/2020 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, right ankle and foot 10/24/2019 10/24/2019 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 08/10/2020 08/10/2020 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, left ankle and foot 10/24/2019 10/24/2019 B95.62 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 10/24/2019 10/24/2019 classified elsewhere L97.322 Non-pressure chronic ulcer of left ankle with fat layer exposed 09/29/2021 09/29/2021 Resolved Problems Electronic Signature(s) Signed: 12/29/2021 8:41:29 AM By: [name] MD FACS Entered By: [name] on 12/29/2021 08:41:29 -------------------------------------------------------------------------------- Progress Note Details Patient Name: Date of Service: [name], [name] 12/29/2021 8:15 A M Medical Record Number: [id number] Patient Account Number: [id number] Date of Birth/Sex: Treating RN: 03-20-1986 (36 y.o. 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 He comes in for review of a wound over the plantar fifth metatarsal head extending into the lateral part of the foot. He was followed for this previously by his podiatrist Dr. Cornelius Moras. As the patient tells his story he went to see podiatry first for a swelling he developed on the lateral part of his fifth metatarsal head in May. He states this was "open" by podiatry and the area closed. He was followed up in June and it was again opened callus removed and it closed promptly. There were plans being made for surgery on the fifth metatarsal head in June however his blood sugar was apparently too high for anesthesia. Apparently the area was debrided and opened again in June and it is never closed since. Looking over the records from podiatry I am really not able to follow this. It was clear when he was first seen it was before 5/14 at that point he already had a wound. By 5/17 the ulcer was resolved. I do not see anything about a procedure. On 5/28 noted to have pre-ulcerative moderate keratosis. X-ray noted 1/5 contracted toe and tailor's bunion and metatarsal deformity. On a visit date on  09/28/2018 the dorsal part of the left foot it healed and resolved. There was concern about swelling in his lower extremity he was sent to the ER.. As far as I can tell he was seen in the ER on 7/12 with an ulcer on his left foot. A DVT rule out of the left leg was negative. I do not think I have complete records from podiatry but I am not able to verify the procedures this patient states he had. He states after the last procedure the wound has never closed although I am not able to follow this in the records I have from podiatry. He has not had a recent x-ray The patient has been using Neosporin on the wound. He is wearing a Darco shoe. He is still very active up on his foot working and exercising. Past medical  history; type 2 diabetes ketosis-prone, leg swelling with a negative DVT study in July. Non-smoker ABI in our clinic was 0.85 on the left 10/16; substantial wound on the plantar left fifth met head extending laterally almost to the dorsal fifth MTP. We have been using silver alginate we gave him a Darco forefoot off loader. An x-ray did not show evidence of osteomyelitis did note soft tissue emphysema which I think was due to gas tracking through an open wound. There is no doubt in my mind he requires an MRI 10/23; MRI not booked until 3 November at the earliest this is largely due to his glucose sensor in the right arm. We have been using silver alginate. There has been an improvement 10/29; I am still not exactly sure when his MRI is booked for. He says it is the third but it is the 10th in epic. This definitely needs to be done. He is running a low-grade fever today but no other symptoms. No real improvement in the 1 02/26/2019 patient presents today for a follow-up visit here in our clinic he is last been seen in the clinic on October 29. Subsequently we were working on getting MRI to evaluate and see what exactly was going on and where we would need to go from the standpoint of whether or not he had osteomyelitis and again what treatments were going be required. Subsequently the patient ended up being admitted to the hospital on 02/07/2019 and was discharged on 02/14/2019. This is a somewhat interesting admission with a discharge diagnosis of pneumonia due to COVID-19 although he was positive for COVID-19 when tested at the urgent care but negative x2 when he was actually in the hospital. With that being said he did have acute respiratory failure with hypoxia and it was noted he also have a left foot ulceration with osteomyelitis. With that being said he did require oxygen for his pneumonia and I level 4 L. He was placed on antivirals and steroids for the COVID-19. He was also transferred to the  [facility] at one point. Nonetheless he did subsequently discharged home and since being home has done much better in that regard. The CT angiogram did not show any pulmonary embolism. With regard to the osteomyelitis the patient was placed on vancomycin and Zosyn while in the hospital but has been changed to Augmentin at discharge. It was also recommended that he follow- up with wound care and podiatry. Podiatry however wanted him to see [name] according to the patient prior to them doing anything further. His hemoglobin A1c was 9.9 as noted in the hospital. Have an MRI of the left foot performed  while in the hospital on 02/04/2019. 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 There is an overlying plantar open wound noted an abscess tracking back along the lateral aspect of the fifth metatarsal shaft. There is otherwise diffuse cellulitis and mild fasciitis without findings of polymyositis. The patient did have recently pneumonia secondary to COVID-19 I looked in the chart through epic and it does appear that the patient may need to have an additional x-ray just to ensure everything is cleared and that he has no airspace disease prior to putting him into the [name]. 03/05/2019; patient was readmitted to the clinic last week. He was hospitalized twice for a viral upper respiratory tract infection from 11/1 through 11/4 and then 11/5 through 11/12 ultimately this turned out to be Covid pneumonitis. Although he was discharged on oxygen he is not using it. He says he feels fine. He has no exercise limitation no cough no sputum. His O2 sat in our clinic today was 100% on room air. 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 He received Vanco and Zosyn in the hospital and then was discharged on 2  weeks of Augmentin. I do not see any relevant cultures. He was supposed to follow-up with infectious disease but I do not see that he has an appointment. 12/8; patient saw Dr. Novella Olive of infectious disease last week. He felt that he had had adequate antibiotic therapy. He did not go to follow-up with Dr. Amalia Hailey of podiatry and I have again talked to him about the pros and cons of this. He does not want to consider a ray amputation of this time. He is aware of the risks of recurrence, migration etc. He started HBO today and tolerated this well. He can complete the Augmentin that I gave him last week. I have looked over the lab work that Dr. Chana Bode ordered his C-reactive protein was 3.3 and his sedimentation rate was 17. The C-reactive protein is never really been measurably that high in this patient 12/15; not much change in the wound today however he has undermining along the lateral part of the foot again more extensively than last week. He has some rims of epithelialization. We have been using silver alginate. He is undergoing hyperbarics but did not dive today 12/18; in for his obligatory first total contact cast change. Unfortunately there was pus coming from the undermining area around his fifth metatarsal head. This was cultured but will preclude reapplication of a cast. He is seen in conjunction with HBO 12/24; patient had staph lugdunensis in the wound in the undermining area laterally last time. We put him on doxycycline which should have covered this. The wound looks better today. I am going to give him another week of doxycycline before reattempting the total contact cast 12/31; the patient is completing antibiotics. Hemorrhagic debris in the distal part of the wound with some undermining distally. He also had hyper granulation. Extensive debridement with a #5 curette. The infected area that was on the lateral part of the fifth met head is closed over. I do not think he needs any  more antibiotics. Patient was seen prior to HBO. Preparations for a total contact cast were made in the cast will be placed post hyperbarics 04/11/19; once again the patient arrives today without complaint. He had been in a cast  all week noted that he had heavy drainage this week. This resulted in large raised areas of macerated tissue around the wound 1/14; wound bed looks better slightly smaller. Hydrofera Blue has been changing himself. He had a heavy drainage last week which caused a lot of maceration around the wound so I took him out of a total contact cast he says the drainage is actually better this week He is seen today in conjunction with HBO 1/21; returns to clinic. He was up in [place] for a day or 2 attending a funeral. He comes back in with the wound larger and with a large area of exposed bone. He had osteomyelitis and septic arthritis of the fifth left metatarsal head while he was in hospital. He received IV antibiotics in the hospital for a prolonged period of time then 3 weeks of Augmentin. Subsequently I gave him 2 weeks of doxycycline for more superficial wound infection. When I saw this last week the wound was smaller the surface of the wound looks satisfactory. 1/28; patient missed hyperbarics today. Bone biopsy I did last time showed Enterococcus faecalis and Staphylococcus lugdunensis . He has a wide area of exposed bone. We are going to use silver alginate as of today. I had another ethical discussion with the patient. 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 In this situation I think the likelihood of healing this is low. Therefore I have recommended a ray amputation and with the patient's agreement I have referred him to Dr. Doran Durand. The other issue is that his compliance with hyperbarics has been minimal because of his work schedule and given his underlying decision I am going to stop this today READMISSION 10/24/2019 MRI 09/29/2019 left  foot IMPRESSION: 1. Apparent skin ulceration inferior and lateral to the 5th metatarsal base with underlying heterogeneous T2 signal and enhancement in the subcutaneous fat. Small peripherally enhancing fluid collections along the plantar and lateral aspects of the 5th metatarsal base suspicious for abscesses. 2. Interval amputation through the mid 5th metatarsal with nonspecific low-level marrow edema and enhancement. Given the proximity to the adjacent soft tissue inflammatory changes, osteomyelitis cannot be excluded. 3. The additional bones appear unremarkable. MRI 09/29/2019 right foot IMPRESSION: 1. Soft tissue ulceration lateral to the 5th MTP joint. There is low-level T2 hyperintensity within the 4th and 5th metatarsal heads and adjacent proximal phalanges without abnormal T1 signal or cortical destruction. These findings are nonspecific and could be seen with early marrow edema, hyperemia or early osteomyelitis. No evidence of septic joint. 2. Mild tenosynovitis and synovial enhancement associated with the extensor digitorum tendons at the level of the midfoot. 3. Diffuse low-level muscular T2 hyperintensity and enhancement, most consistent with diabetic myopathy. LEFT FOOT BONE Methicillin resistant staphylococcus aureus Staphylococcus lugdunensis MIC MIC CIPROFLOXACIN >=8 RESISTANT Resistant <=0.5 SENSI... Sensitive CLINDAMYCIN <=0.25 SENS... Sensitive >=8 RESISTANT Resistant ERYTHROMYCIN >=8 RESISTANT Resistant >=8 RESISTANT Resistant GENTAMICIN <=0.5 SENSI... Sensitive <=0.5 SENSI... Sensitive Inducible Clindamycin NEGATIVE Sensitive NEGATIVE Sensitive OXACILLIN >=4 RESISTANT Resistant 2 SENSITIVE Sensitive RIFAMPIN <=0.5 SENSI... Sensitive <=0.5 SENSI... Sensitive TETRACYCLINE <=1 SENSITIVE Sensitive <=1 SENSITIVE Sensitive TRIMETH/SULFA <=10 SENSIT Sensitive <=10 SENSIT Sensitive ... Marland Kitchen.. VANCOMYCIN 1 SENSITIVE Sensitive <=0.5 SENSI... Sensitive Right foot  bone . Component 3 wk ago Specimen Description BONE Special Requests RIGHT 4 METATARSAL SAMPLE B Gram Stain NO WBC SEEN NO ORGANISMS SEEN Culture RARE METHICILLIN RESISTANT STAPHYLOCOCCUS AUREUS NO ANAEROBES ISOLATED Performed at [name], [place] [address], [place], [place] [address] Report Status 10/08/2019  FINAL Organism ID, Bacteria METHICILLIN RESISTANT STAPHYLOCOCCUS AUREUS Resulting Agency [facility] Susceptibility Methicillin resistant staphylococcus aureus MIC CIPROFLOXACIN >=8 RESISTANT Resistant CLINDAMYCIN <=0.25 SENS... Sensitive ERYTHROMYCIN >=8 RESISTANT Resistant GENTAMICIN <=0.5 SENSI... Sensitive Inducible Clindamycin NEGATIVE Sensitive OXACILLIN >=4 RESISTANT Resistant RIFAMPIN <=0.5 SENSI... Sensitive TETRACYCLINE <=1 SENSITIVE Sensitive TRIMETH/SULFA <=10 SENSIT Sensitive ... VANCOMYCIN 1 SENSITIVE Sensitive This is a patient we had in clinic earlier this year with a wound over his left fifth metatarsal head. 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 Doran Durand on 05/16/2019. He seems to have developed problems with wounds on his bilateral feet in June 2021 although he may have had problems earlier than this. He was in an urgent care with a right foot ulcer on 09/26/2019 and given a course of doxycycline. This was apparently after having trouble getting into see orthopedics. He was seen by podiatry on 09/28/2019 noted to have bilateral lower extremity ulcers including the left lateral fifth metatarsal base and the right subfifth met head. It was noted that had purulent drainage at that time. He required hospitalization from 6/20 through 7/2. This was because of worsening right foot  wounds. He underwent bilateral operative incision and drainage and bone biopsies bilaterally. Culture results are listed above. He has been referred back to clinic by Dr. Jacqualyn Posey of podiatry. He is also followed by Dr. Megan Salon who saw him yesterday. He was discharged from hospital on Zyvox Flagyl and Levaquin and yesterday changed to doxycycline Flagyl and Levaquin. His inflammatory markers on 6/26 showed a sedimentation rate of 129 and a C-reactive protein of 5. This is improved to 14 and 1.3 respectively. This would indicate improvement. ABIs in our clinic today were 1.23 on the right and 1.20 on the left 11/01/2019 on evaluation today patient appears to be doing fairly well in regard to the wounds on his feet at this point. Fortunately there is no signs of active infection at this time. No fevers, chills, nausea, vomiting, or diarrhea. He currently is seeing infectious disease and still under their care at this point. Subsequently he also has both wounds which she has not been using collagen on as he did not receive that in his packaging he did not call us and let us know that. Apparently that just was missed on the order. Nonetheless we will get that straightened out today. 8/9-Patient returns for bilateral foot wounds, using Prisma with hydrogel moistened dressings, and the wounds appear stable. Patient using surgical shoes, avoiding much pressure or weightbearing as much as possible 8/16; patient has bilateral foot wounds. 1 on the right lateral foot proximally the other is on the left mid lateral foot. Both required debridement of callus and thick skin around the wounds. We have been using silver collagen 8/27; patient has bilateral lateral foot wounds. The area on the left substantially surrounded by callus and dry skin. This was removed from the wound edge. The underlying wound is small. The area on the right measured somewhat smaller today. We've been using silver collagen the patient was  on antibiotics for underlying osteomyelitis in the left foot. Unfortunately I did not update his antibiotics during today's visit. 9/10 I reviewed Dr. Hale Bogus last notes he felt he had completed antibiotics his inflammatory markers were reasonably well  controlled. He has a small wound on the lateral left foot and a tiny area on the right which is just above closed. He is using Hydrofera Blue with border foam he has bilateral surgical shoes 9/24; 2 week f/u. doing well. right foot is closed. left foot still undermined. 10/14; right foot remains closed at the fifth met head. The area over the base of the left fifth metatarsal has a small open area but considerable undermining towards the plantar foot. Thick callus skin around this suggests an adequate pressure relief. We have talked about this. He says he is going to go back into his cam boot. I suggested a total contact cast he did not seem enamored with this suggestion 10/26; left foot base of the fifth metatarsal. Same condition as last time. He has skin over the area with an open wound however the skin is not adherent. He went to see Dr. Earleen Newport who did an x-ray and culture of his foot I have not reviewed the x-ray but the patient was not told anything. He is on doxycycline 11/11; since the patient was last here he was in the emergency room on 10/30 he was concerned about swelling in the left foot. They did not do any cultures or x-rays. They changed his antibiotics to cephalexin. Previous culture showed group B strep. The cephalexin is appropriate as doxycycline has less than predictable coverage. Arrives in clinic today with swelling over this area under the wound. He also has a new wound on the right fifth metatarsal head 11/18; the patient has a difficult wound on the lateral aspect of the left fifth metatarsal head. The wound was almost ballotable last week I opened it slightly expecting to see purulence however there was just bleeding. I  cultured this this was negative. X-ray unchanged. We are trying to get an MRI but I am not sure were going to be able to get this through his insurance. He also has an area on the right lateral fifth metatarsal head this looks healthier 12/3; the patient finally got our MRI. Surprisingly this did not show osteomyelitis. I did show the soft tissue ulceration at the lateral plantar aspect of the fifth metatarsal base with a tiny residual 6 mm abscess overlying the superficial fascia I have tried to culture this area I have not been able to get this to grow anything. Nevertheless the protruding tissue looks aggravated. I suspect we should try to treat the underlying "abscess with broad-spectrum antibiotics. I am going to start him on Levaquin and Flagyl. He has much less edema in his legs and I am going to continue to wrap his legs and see him weekly 12/10. I started Levaquin and Flagyl on him last week. He just picked up the Flagyl apparently there was some delay. The worry is the wound on the left fifth metatarsal base which is substantial and worsening. His foot looks like he inverts at the ankle making this a weightbearing surface. Certainly no improvement in fact I think the measurements of this are somewhat worse. We have been using 12/17; he apparently just got the Levaquin yesterday this is 2 weeks after the fact. He has completed the Flagyl. The area over the left fifth metatarsal base still has protruding granulation tissue although it does not look quite as bad as it did some weeks ago. He has severe bilateral lymphedema although we have not been treating him for wounds on his legs this is definitely going to require compression. There was so  much edema in the left I did not wish to put him in a total contact cast today. I am going to increase his compression from 3-4 layer. The area on the right lateral fifth met head actually look quite good and superficial. 12/23; patient arrived with  callus on the right fifth met head and the substantial hyper granulated callused wound on the base of his fifth metatarsal. He says he is completing his Levaquin in 2 days but I do not think that adds up with what I gave him but I will have to double check this. We are using Hydrofera Blue on both areas. My plan is to put the left leg in a cast the week after New Year's 04/06/2020; patient's wounds about the same. Right lateral fifth metatarsal head and left lateral foot over the base of the fifth metatarsal. There is undermining on the left lateral foot which I removed before application of total contact cast continuing with Hydrofera Blue new. Patient tells me he was seen by endocrinology today lab work was done [Dr. Kerr]. Also wondering whether he was referred to cardiology. I went over some lab work from previously does not have chronic renal failure certainly not nephrotic range proteinuria he does have very poorly controlled diabetes but this is not his most updated lab work. Hemoglobin A1c has been over 11 1/10; the patient had a considerable amount of leakage towards mid part of his left foot with macerated skin however the wound surface looks better the area on the right lateral fifth met head is better as well. I am going to change the dressing on the left foot under the total contact cast to silver alginate, continue with Hydrofera Blue on the right. 1/20; patient was in the total contact cast for 10 days. Considerable amount of drainage although the skin around the wound does not look too bad on the left foot. The area on the right fifth metatarsal head is closed. Our nursing staff reports large amount of drainage out of the left lateral foot wound 1/25; continues with copious amounts of drainage described by our intake staff. PCR culture I did last week showed E. coli and Enterococcus faecalis and low quantities. Multiple resistance genes documented including extended spectrum beta  lactamase, MRSA, MRSE, quinolone, tetracycline. The wound is not quite as good this week as it was 5 days ago but about the same size 2/3; continues with copious amounts of malodorous drainage per our intake nurse. The PCR culture I did 2 weeks ago showed E. coli and low quantities of Enterococcus. There were multiple resistance genes detected. I put Neosporin on him last week although this does not seem to have helped. The wound is slightly deeper today. Offloading continues to be an issue here although with the amount of drainage she has a total contact cast is just not going to work 2/10; moderate amount of drainage. Patient reports he cannot get his stocking on over the dressing. I told him we have to do that the nurse gave him suggestions on how to make this work. The wound is on the bottom and lateral part of his left foot. Is cultured predominantly grew low amounts of Enterococcus, E. coli and anaerobes. There were multiple resistance genes detected including extended spectrum beta lactamase, quinolone, tetracycline. I could not think of an easy oral combination to address this so for now I am going to do topical antibiotics provided by [facility] I think the main agents here are vancomycin and an  aminoglycoside. We have to be able to give him access to the wounds to get the topical antibiotic on 2/17; moderate amount of drainage this is unchanged. He has his Keystone topical antibiotic against the deep tissue culture organisms. He has been using this and changing the dressing daily. Silver alginate on the wound surface. 2/24; using Keystone antibiotic with silver alginate on the top. He had too much drainage for a total contact cast at one point although I think that is improving and I think in the next week or 2 it might be possible to replace a total contact cast I did not do this today. In general the wound surface looks healthy however he continues to have thick rims of skin and subcutaneous  tissue around the wide area of the circumference which I debrided 06/04/2020 upon evaluation today patient appears to be doing well in regard to his wound. I do feel like he is showing signs of improvement. There is little bit of callus and dead tissue around the edges of the wound as well as what appears to be a little bit of a sinus tract that is off to the side laterally I would perform debridement to clear that away today. 3/17; left lateral foot. The wound looks about the same as I remember. Not much depth surface looks healthy. No evidence of infection 3/25; left lateral foot. Wound surface looks about the same. Separating epithelium from the circumference. There really is no evidence of infection here however not making progress by my view 3/29; left lateral foot. Surface of the wound again looks reasonably healthy still thick skin and subcutaneous tissue around the wound margins. There is no evidence of infection. One of the concerns being brought up by the nurses has again the amount of drainage vis--vis continued use of a total contact cast 4/5; left lateral foot at roughly the base of the fifth metatarsal. Nice healthy looking granulated tissue with rims of epithelialization. The overall wound measurements are not any better but the tissue looks healthy. The only concern is the amount of drainage although he has no surrounding maceration with what we have been doing recently to absorb fluid and protect his skin. He also has lymphedema. 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 He states that the kerlix/coban under the TCC rolled down and could not get it back up. He has been using an offloading scooter and has somehow hurt his right foot using  this device. This happened last week. He states that the side of his right foot developed a blister and opened. The top of his foot also has a few small open wounds he thinks is due to his socks rubbing in his shoes. He has not been using any dressings to the wound. He denies purulent drainage, fever/chills or erythema to the wounds. 4/22; patient presents for 1 week follow-up. He developed new wounds to the right foot that were evaluated at last clinic visit. He continues to have a total contact cast to the left leg and he reports no issues. He has been using silver collagen to the right foot wounds with no issues. He denies purulent drainage, fever/chills or erythema to the right foot wounds. He has no complaints today 4/25;  patient presents for 1 week follow-up. He has a total contact cast of the left leg and reports no issues. He has been using silver alginate to the right foot wound. He denies purulent drainage, fever/chills or erythema to the right foot wounds. 5/2 patient presents for 1 week follow-up. T contact cast on the left. The wound which is on the base of the plantar foot at the base of the fifth metatarsal otal actually looks quite good and dimensions continue to gradually contract. HOWEVER the area on the right lateral fifth metatarsal head is much larger than what I remember from 2 weeks ago. Once more is he has significant levels of hypergranulation. Noteworthy that he had this same hyper granulated response on his wound on the left foot at one point in time. So much so that he I thought there was an underlying fluid collection. Based on this I think this just needs debridement. 5/9; the wound on the left actually continues to be gradually smaller with a healthy surface. Slight amount of drainage and maceration of the skin around but not too bad. However he has a large wound over the right fifth metatarsal head very much in the same configuration as his left foot wound was  initially. I used silver nitrate to address the hyper granulated tissue no mechanical debridement 5/16; area on the left foot did not look as healthy this week deeper thick surrounding macerated skin and subcutaneous tissue. oo The area on the right foot fifth met head was about the same oo The area on the right ankle that we identified last week is completely broken down into an open wound presumably a stocking rubbing issue 5/23; patient has been using a total contact cast to the left side. He has been using silver alginate underneath. He has also been using silver alginate to the right foot wounds. He has no complaints today. He denies any signs of infection. 5/31; the left-sided wound looks some better measure smaller surface granulation looks better. We have been using silver alginate under the total contact cast oo The large area on his right fifth met head and right dorsal foot look about the same still using silver alginate 6/6; neither side is good as I was hoping although the surface area dimensions are better. A lot of maceration on his left and right foot around the wound edge. Area on the dorsal right foot looks better. He says he was traveling. I am not sure what does the amount of maceration around the plantar wounds may be drainage issues 6/13; in general the wound surfaces look quite good on both sides. Macerated skin and raised edges around the wound required debridement although in general especially on the left the surface area seems improved. oo The area on the right dorsal ankle is about the same I thought this would not be such a problem to close 6/20; not much change in either wound although the one on the right looks a little better. Both wounds have thick macerated edges to the skin requiring debridements. We have been using silver alginate. The area on the dorsal right ankle is still open I thought this would be closed. 6/28; patient comes in today with a marked  deterioration in the right foot wound fifth met head. Wide area of exposed bone this is a drastic change from last time. The area on the left there we have been casting is stagnant. We have been using silver alginate in both wound areas. 7/5; bone culture  I did for PCR last time was positive for Pseudomonas, group B strep, Enterococcus and Staph aureus. There was no suggestion of methicillin resistance or ampicillin resistant genes. This was resistant to tetracycline however He comes into the clinic today with the area over his right plantar fifth metatarsal head which had been doing so well 2 weeks ago completely necrotic feeling bone. I do not know that this is going to be salvageable. The left foot wound is certainly no smaller but it has a better surface and is superficial. 7/8; patient called in this morning to say that his total contact cast was rubbing against his foot. He states he is doing fine overall. He denies signs of infection. 7/12; continued deterioration in the wound over the right fifth metatarsal head crumbling bone. This is not going to be salvageable. The patient agrees and wants to be referred to Dr. Doran Durand which we will attempt to arrange as soon as possible. I am going to continue him on antibiotics as long as that takes so I will renew those today. The area on the left foot which is the base of the fifth metatarsal continues to look somewhat better. Healthy looking tissue no depth no debridement is necessary here. 7/20; the patient was kindly seen by Dr. Doran Durand of orthopedics on 10/19/2020. 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 Towards this end we have taken him out of the total contact cast on the left we will put him in a wrap with Hydrofera Blue. As I understand things surgery is planned for 7/21 7/27; patient had his surgery last Thursday. 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 Apparently everything went well we did not still disturb that today The area on the left foot actually looks quite good. He has been much less mobile which probably explains this he did not seem to do well in the total contact cast secondary to drainage and maceration I think. 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 Fortunately there is no evidence of active infection at this time. No fevers, chills, nausea, vomiting, or diarrhea. Overall I think that he is actually doing extremely well. Nonetheless I do believe that he is staying off of this more following the surgery in his right foot that is the reason the left is doing so great. 8/16; left plantar foot wound. This looks smaller than the last time I saw this he is using Hydrofera Blue. The surgical wound on the right foot is being followed by Dr. Doran Durand we did not look at this today. He has surgical shoes on both feet 8/23; left plantar foot wound not as good this week. Surrounding macerated skin and subcutaneous tissue everything looks moist and wet. I do not think he is offloading this adequately. He is using a surgical shoe Apparently the right foot surgical wound is not open although I did not check his foot 8/31; left plantar foot lateral aspect. Much improved this week. He has no maceration. Some improvement in the surface area of the wound but most impressively the depth is come in we are using silver alginate. The patient is a Product/process development scientist. He is asked that we write him a letter so he can go back to work. I have also tried to see  if we can write something that will allow him to limit the amount of time that he is on his foot at work. 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 Hopefully the latter can be avoided 9/6; I believe  the patient missed an appointment last week. He arrives in today with a wound looking roughly the same certainly no better. Undermining laterally and also inferiorly. We used molecuLight today in training with the patient's permission.. We are using silver alginate 9/21 wound is measuring bigger this week although this may have to do with the aggressive circumferential debridement last week in response to the blush fluorescence on the MolecuLight. Culture I did last week showed significant MSSA and E. coli. I put him on Augmentin but he has not started it yet. We are also going to send this for compounded antibiotics at [facility]. There is no evidence of systemic infection 9/29; silver alginate. His Keystone arrived. He is completing Augmentin in 2 days. Offloading in a cam boot. Moderate drainage per our intake staff 10/5; using silver alginate. He has been using his [place]. He has completed his Augmentin. Per our intake nurse still a lot of drainage, far too much to consider a total contact cast. Wound measures about the same. He had the same undermining area that I defined last week from a roughly 11-3. I remove this today 10/12; using silver alginate he is using the [place]. He comes in for a nurse visit hence we are applying [employer] twice a week. Measuring slightly better today and less notable drainage. Extensive debridement of the wound edge last time 10/18; using topical Keystone and silver alginate and a soft cast. Wound measurements about the same. Drainage was through his soft cast. We are changing this twice a week Tuesdays and Friday 10/25; comes in with moderate drainage. Still using Keystone silver alginate and a soft cast. Wound dimensions completely the same.He has a lot of edema in the left leg he has lymphedema. Asking for [name] to consider wrapping him as he cannot get his stocking on over the soft cast 11/2; comes in with moderate to large drainage slightly smaller in terms of  width we have been using [place]. His wound looks satisfactory but not much improvement 11/4; patient presents today for obligatory cast change. Has no issues or complaints today. He denies signs of infection. 11/9; patient traveled this weekend to [place], was on the cast quite a bit. Staining of the cast with black material from his walking boot. Drainage was not quite as bad as we feared. Using silver alginate and Keystone 11/16; we do not have size for cast therefore we have been putting a soft cast on him since the change on Friday. Still a significant amount of drainage necessitating changing twice a week. We have been using the Keystone at cast changes either hard or soft as well as silver alginate Comes in the clinic with things actually looking fairly good improvement in width. He says his offloading is about the same 02/24/2021 upon evaluation today patient actually comes back in and is doing excellent in regard to his foot ulcer this is significantly smaller even compared to the last visit. The soft cast seems to have done extremely well for him which is great news. I do not see any signs of infection minimal debridement will be needed today. 11/30; left lateral foot much improved half a centimeter improvement in surface  area. No evidence of infection. He seems to be doing better with the soft cast in the TCC therefore we will continue with this. He comes back in later in the week for a change with the nurses. This is due to drainage 12/6; no improvement in dimensions. Under illumination some debris on the surface we have been using silver alginate, soft cast. If there is anything optimistic here he seems to have have less drainage 12/13. Dimensions are improved both length and width and slightly in depth. Appears to be quite healthy today. Raised edges of this thick skin and callus around the edges however. He is in a soft cast were bringing him back once for a change on Friday. Drainage is  better 12/20. Dimensions are improved. He still has raised edges of thick skin and callus around the edges. We are using a soft cast 12/28; comes in today with thick callus around the wound. Using silver under alginate under a soft cast. I do not think there is much improvement in any measurement 2023 04/06/2021; patient was put in a total contact cast. Unfortunately not much change in surface area 1/10; not much different still thick callus and skin around the edge in spite of the total contact cast. This was just debrided last week we have been using the [facility] compounded antibiotic and silver alginate under a total contact cast 1/18 the patient's wound on the left side is doing nicely. smaller HOWEVER he comes in today with a wound on the right foot laterally. blister most likely serosangquenous drainage 1/24; the patient continues to do well in terms of the plantar left foot which is continued to contract using silver alginate under the total contact cast HOWEVER the right lateral foot is bigger with denuded skin around the edges. I used pickups and a #15 scalpel to remove this this looks like the remanence of a large blister. Cannot rule out infection. Culture in this area I did last week showed Staphylococcus lugdunensis few colonies. I am going to try to address this with his [employer] antibiotic that is done so well on the left having linezolid and this should cover the staph 2/1; the patient's wound on his left foot which was the original plantar foot wound thick skin and eschar around the edges even in the total contact cast but the wound surface does not look too bad The real problem is on how his right lateral foot at roughly the base of the fifth metatarsal. The wound is completely necrotic more worrisome than that there is swelling around the edges of this. We have been using silver alginate on both wounds and Keystone on the right foot. Unfortunately I think he is going to require  systemic antibiotics while we await cultures. He did not get the x-ray done that we ordered last week [lost the prescription 2/7; disappointingly in the area on the left foot which we are treating with a total contact cast is still not closed although it is much smaller. He continues to have a lot of callus around the wound edge. -Right lateral foot culture I did last week was negative x-ray also negative for osteomyelitis. 2/15: TCC silver alginate on the left and silver alginate on the right lateral. No real improvement in either area 05/26/2021: T oday, the wounds are roughly the same size as at his previous visit, post-debridement. He continues to endorse fairly substantial drainage, particularly on the right. He has been in a total contact cast on the left. There  is still some callus surrounding this lesion. On the right, the periwound skin is quite macerated, along with surrounding callus. The center of the right-sided wound also has some dark, densely adherent material, which is very difficult to remove. 06/02/2021: Today, both wounds are slightly smaller. He has been using zinc oxide ointment around the right ulcer and the degree of maceration has improved markedly. There continues to be an area of nonviable tissue in the center of the right sided ulcer. The left-sided wound, which has been in the total contact cast. Appears clean and the degree of callus around it is less than previously. 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 He had to take the stairs and both wounds have increased in size. The left foot, which has been in a total contact cast, has developed a tunnel tracking to the lateral aspect of his foot. The nonviable tissue in the center of the right-sided ulcer remains recalcitrant to debridement. There is significant undermining surrounding the entirety of the left sided wound. 06/16/2021: The elevator at school has  been fixed and the patient has been able to avoid putting as much weight on his wounds over the past week. We converted the left foot wound into a single lesion today, but despite this, the wound is actually smaller. The base is healthy with limited periwound callus. On the right, the central necrotic area is still present. He continues to be quite macerated around the right sided wound, despite applying barrier cream. This does, however, have the benefit of softening the callus to make it more easily removable. 06/23/2021: Today, the left wound is smaller. The lateral aspect that had opened up previously is now closed. The wound base has a healthy bed of granulation tissue and minimal slough. Unfortunately, on the right, the wound is larger and continues to be fairly macerated. He has also reopened the wound at his right ankle. He thinks this is due to the gait he has adopted secondary to his total contact cast and boot. 06/30/2021: T oday, both wounds are a little bit larger. The lateral aspect on the left has remained closed. He continues to have significant periwound maceration. The culture that I took from the right sided wound grew a population of bacteria that is not covered by his current [facility] antibiotic. The center of the right- sided wound continues to appear necrotic with nonviable fat. It probes deeper today, but does not reach bone. 07/07/2021: The periwound maceration is a little bit less today. The right lateral foot wound has some areas that appear more viable and the necrotic center also looks a little bit better. The wound on the dorsal surface of his right foot near the ankle is contracting and the surface appears healthy. The left plantar wound surface looks healthy, but there is some new undermining on the medial portion. He did get his new Keystone antibiotic and began applying that to the right foot wound on Saturday. 07/14/2021: The intake nurse reported substantial drainage from  his wounds, but the periwound skin actually looks better than is typical for him. The wound on the dorsal surface of his right foot near the ankle is smaller and just has a small open area underneath some dried eschar. The left plantar wound surface looks healthy and there has been no significant accumulation of callus. The right lateral foot wound looks quite a bit better, with the central portion, which typically appears necrotic, looking more  viable albeit pale. 07/22/2021: His left foot is extremely macerated today. The wound is about the same size. The wound on the dorsal surface of his right foot near the ankle had closed, but he traumatized it removing the dressing and there is a tiny skin tear in that location. The right lateral foot wound is bigger, but the surface appears healthy. 07/30/2021: The wound on the dorsal surface of his right foot near the ankle is closed. The right lateral foot wound again is a little bit bigger due to some undermining. The periwound skin is in better condition, however. He has been applying zinc oxide. The wound surface is a little bit dry today. On the left, he does not have the substantial maceration that we frequently see. The wound itself is smaller and has a clean surface. 08/06/2021: Both wounds seem to have deteriorated over the past week. The right lateral foot wound has a dry surface but the periwound is boggy.. Overall wound dimensions are about the same. On the left, the wound is about the same size, but there is more undermining present underneath periwound callus. 08/13/2021: The right sided wound looks about the same, but on the left there has been substantial deterioration. The undermining continues to extend under periwound callus. Once this was removed, substantial extension of the wound was present. There is no odor or purulent drainage but clearly the wounds have broken down. 08/20/2021: The wounds look about the same today. He has been out of his  total contact cast and has just been changing the dressings using topical Keystone with PolyMem Ag, Kerlix and Ace bandages. The wound on the top of his right ankle has reopened but this is quite small. There was a little bit of purulent material that I expressed when examining this wound. 08/24/2021: After the aggressive debridement I performed at his last visit, the wounds actually look a little bit better today. They are smaller with the exception of the wound on the top of his right ankle which is a little bit bigger as some more skin pulled off when he was changing his dressing. We are using topical Keystone with PolyMem Ag Kerlix and Ace bandages. 09/02/2021: There has been really no change to any of his wounds. 09/16/2021: The patient was hospitalized last week with nausea, vomiting, and dehydration. He says that while he was in the hospital, his wounds were not really addressed properly. T oday, both plantar foot wounds are larger and the periwound skin is macerated. The wound on the dorsum of his right foot has a scab on the top. The right foot now has a crater where previously he had had nonviable fat. It looks as though this simply died and fell out. The periwound callus is wet. 09/24/2021: His wounds have deteriorated somewhat since his last visit. The wound on the dorsum of his right foot near his ankle is larger and has more nonviable tissue present. The crater in his right foot is even deeper; I cannot quite palpate or probe to bone but I am sure it is close. The wound on his left plantar foot has an odd boggy area in the center that almost feels as though it has fluid within it. He has run out of his topical Keystone antibiotic. We are using silver alginate on his wounds. 09/29/2021: He has developed a new wound on the dorsum of his left foot near his ankle. He says he thinks his wrapping is rubbing in that site. I would concur with  this as the wound on his right ankle is larger. The left  foot looks about the same. The right foot has the crater that was present last week. No significant slough accumulation, but his foot remains quite swollen and warm despite oral antibiotic therapy. 10/08/2021: All of his wounds look about the same as last week. He did not start his oral antibiotics that are prescribed until just a couple of days ago; his [employer] compounded antibiotics formula has been changed and he is awaiting delivery of the new recipe. His MRI that was scheduled for earlier this week was canceled as no prior authorization had been obtained; unfortunately the tech responsible sent an email to my old  email, which I no longer use nor have access to. 10/18/2021: The wounds on his bilateral dorsal feet near the ankles are both improved. They are smaller and have just some eschar and slough buildup. The left plantar wound has a fair amount of undermining, but the surface is clean. There is some periwound callus accumulation. On the right plantar foot, there is nonviable fat leading to a deep tunnel that tracks towards his dorsal medial foot. There is periwound callus and slough accumulation, as well. His right foot and leg remain swollen as compared to the left. 10/25/2021: The wounds on his bilateral dorsal feet and at the ankles have broken down somewhat. They are little bit larger than last week. The left plantar wound continues to undermine laterally but the surface is clean. The right plantar foot wound shows some decreased depth in the tunnel tracking towards his dorsal medial foot. He has not yet had the Doppler study that I ordered; it sounds like there is some confusion about the scheduling of the procedure. In addition, the MRI was denied and I have taken steps to appeal the denial. 11/24/2021: Since our last visit, Mr. Doxtater was admitted to the hospital where an MRI suggested osteomyelitis. He was taken the operating room by podiatry. Bone biopsies were negative  for osteomyelitis. They debrided his wounds and applied myriad matrix. He saw them last week and they removed his staples. He is here today to continue his wound healing process. T oday, both of the dorsal foot/ankle wounds are substantially smaller. There is just a little eschar overlying the left sided wound and some eschar and slough on the right. The right plantar foot ulcer has the healthiest surface of granulation tissue that I have seen to date. A portion of the myriad matrix failed to take and was hanging loose. It appears that myriad morcells were placed into the tunnel closest to the dorsal portion of his foot. These have sloughed off. The left plantar foot ulcer is about the same size, but has a much healthier surface than in the past. Both plantar ulcers have callus and slough accumulation. 12/07/2021: Left dorsal foot/ankle wound is closed. The right dorsal foot/ankle wound is nearly closed and just has a small open area with some eschar and slough. The right plantar foot wound has contracted quite a bit since our last visit. It has a healthy surface with just a little bit of slough accumulation and periwound callus buildup. The left plantar foot wound is about the same size but the surface appears healthy. There is a little slough and periwound callus on this side, as well. 12/15/2021: Both dorsal foot/ankle wounds are closed. The right plantar foot wound is substantially smaller than at our last visit. The tunneling that was present has nearly closed. There is  just a little bit of slough buildup. The left plantar foot wound is also a little bit smaller today. The surface is the healthiest that I have ever seen it. Light slough and periwound callus accumulation on this side. 12/22/2021: The dorsal ankle/foot wounds remain closed. The right plantar foot wound continues to contract. There is still a bit of depth at the lateral portion of the wound but the surface has a good granulated  appearance. The wound on the left is about the same size, the central indented portion is still adherent. It also is clean with good granulation tissue present. The periwound skin and callus are a bit macerated, however. 12/29/2021: Both ankle wounds remain closed. The right plantar foot wound is less than half the size that it was last week. There is no depth any longer and the surface has just a little bit of slough and periwound callus. On the left, the wound is not much smaller, but the surface is healthier with good granulation tissue. There is also a little slough and periwound callus buildup. Patient History Information obtained from Patient. Family History No family history of Cancer, Diabetes, Hereditary Spherocytosis, Hypertension, Kidney Disease, Lung Disease, Seizures, Stroke, Thyroid Problems, Tuberculosis. Social History Never smoker, Marital Status - Single, Alcohol Use - Rarely, Drug Use - No History, Caffeine Use - Never. Medical History Eyes Denies history of Cataracts, Glaucoma, Optic Neuritis Ear/Nose/Mouth/Throat Denies history of Chronic sinus problems/congestion, Middle ear problems Hematologic/Lymphatic Denies history of Anemia, Hemophilia, Human Immunodeficiency Virus, Lymphedema, Sickle Cell Disease Respiratory Denies history of Aspiration, Asthma, Chronic Obstructive Pulmonary Disease (COPD), Pneumothorax, Sleep Apnea, Tuberculosis Cardiovascular Denies history of Angina, Arrhythmia, Congestive Heart Failure, Coronary Artery Disease, Deep Vein Thrombosis, Hypertension, Hypotension, Myocardial Infarction, Peripheral Arterial Disease, Peripheral Venous Disease, Phlebitis, Vasculitis Gastrointestinal Denies history of Cirrhosis , Colitis, Crohnoos, Hepatitis A, Hepatitis B, Hepatitis C Endocrine Patient has history of Type II Diabetes Denies history of Type I Diabetes Immunological Denies history of Lupus Erythematosus, Raynaudoos, Scleroderma Integumentary  (Skin) Denies history of History of Burn Musculoskeletal Denies history of Gout, Rheumatoid Arthritis, Osteoarthritis, Osteomyelitis Neurologic Denies history of Dementia, Neuropathy, Quadriplegia, Paraplegia, Seizure Disorder Oncologic Denies history of Received Chemotherapy, Received Radiation Psychiatric Denies history of Anorexia/bulimia, Confinement Anxiety Hospitalization/Surgery History - 11/1-11/06/2018- sepsis foot infection. - 11/4-11/5 02 sats low respiratory distress. Objective Constitutional Slightly tachycardic, asymptomatic. No acute distress.. Vitals Time Taken: 8:16 AM, Height: 77 in, Weight: 280 lbs, BMI: 33.2, Temperature: 98.9 F, Pulse: 103 bpm, Respiratory Rate: 18 breaths/min, Blood Pressure: 137/85 mmHg, Capillary Blood Glucose: 107 mg/dl. Respiratory Normal work of breathing on room air.. General Notes: 12/29/2021: Both ankle wounds remain closed. The right plantar foot wound is less than half the size that it was last week. There is no depth any longer and the surface has just a little bit of slough and periwound callus. On the left, the wound is not much smaller, but the surface is healthier with good granulation tissue. There is also a little slough and periwound callus buildup. Integumentary (Hair, Skin) Wound #3 status is Open. Original cause of wound was Trauma. The date acquired was: 10/02/2019. The wound has been in treatment 113 weeks. The wound is located on the [place]. The wound measures 2.9cm length x 3.9cm width x 0.3cm depth; 8.883cm^2 area and 2.665cm^3 volume. There is Fat Layer (Subcutaneous Tissue) exposed. There is no tunneling or undermining noted. There is a medium amount of serosanguineous drainage noted. The wound margin is well defined and not attached to  the wound base. There is large (67-100%) red, hyper - granulation within the wound bed. There is a small (1-33%) amount of necrotic tissue within the wound bed including  Adherent Slough. Wound #8 status is Open. Original cause of wound was Blister. The date acquired was: 04/18/2021. The wound has been in treatment 36 weeks. The wound is located on the Right,Lateral Foot. The wound measures 1.4cm length x 1.6cm width x 0.1cm depth; 1.759cm^2 area and 0.176cm^3 volume. There is Fat Layer (Subcutaneous Tissue) exposed. There is no tunneling or undermining noted. There is a medium amount of serosanguineous drainage noted. The wound margin is well defined and not attached to the wound base. There is large (67-100%) red, pink granulation within the wound bed. There is a small (1-33%) amount of necrotic tissue within the wound bed including Adherent Slough. 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 There was a Selective/Open Wound Non-Viable Tissue Debridement with a total area of 11.31 sq cm performed by [name], MD. With the following instrument(s): Curette to remove Non-Viable tissue/material. Material removed includes Callus and Biofilm and. No specimens were taken. A time out was conducted at 08:36, prior to the start of the procedure. A Minimum amount of bleeding was controlled with Pressure. The procedure was tolerated well with a pain level of 0 throughout and a pain level of 0 following the procedure. Post Debridement Measurements: 2.9cm length x 3.9cm width x 0.3cm depth; 2.665cm^3 volume. Character of Wound/Ulcer Post Debridement is improved. Severity of Tissue Post Debridement is: Fat layer exposed. Post procedure Diagnosis Wound #3: Same as  Pre-Procedure Wound #8 Pre-procedure diagnosis of Wound #8 is a Diabetic Wound/Ulcer of the Lower Extremity located on the Right,Lateral Foot .Severity of Tissue Pre Debridement is: Fat layer exposed. There was a Selective/Open Wound Non-Viable Tissue Debridement with a total area of 2.24 sq cm performed by [name], MD. With the following instrument(s): Curette to remove Non-Viable tissue/material. Material removed includes Callus and Biofilm and. No specimens were taken. A time out was conducted at 08:36, prior to the start of the procedure. A Minimum amount of bleeding was controlled with Pressure. The procedure was tolerated well with a pain level of 0 throughout and a pain level of 0 following the procedure. Post Debridement Measurements: 1.4cm length x 1.6cm width x 0.1cm depth; 0.176cm^3 volume. Character of Wound/Ulcer Post Debridement is improved. Severity of Tissue Post Debridement is: Fat layer exposed. Post procedure Diagnosis Wound #8: Same as Pre-Procedure Plan Follow-up Appointments: Return Appointment in 1 week. - Dr. [name] - Room 1 with [facility] 01/05/22 @ 8:15 Bathing/ Shower/ Hygiene: May shower and wash wound with soap and water. Edema Control - Lymphedema / SCD / Other: Elevate legs to the level of the heart or above for 30 minutes daily and/or when sitting, a frequency of: - throughout the day Avoid standing for long periods of time. Exercise regularly Compression stocking or Garment 20-30 mm/Hg pressure to: - to both legs daily Off-Loading: Open toe surgical shoe to: - Both feet Additional Orders / Instructions: Follow Nutritious Diet WOUND #3: - Foot Wound Laterality: Plantar, Left, Lateral Cleanser: Soap and Water 1 x  Per Day/30 Days Discharge Instructions: May shower and wash wound with dial antibacterial soap and water prior to dressing change. Peri-Wound Care: Zinc Oxide Ointment 30g tube 1 x Per Day/30 Days Discharge Instructions: Apply Zinc Oxide to  periwound with each dressing change Peri-Wound Care: Sween Lotion (Moisturizing lotion) 1 x Per Day/30 Days Discharge Instructions: Apply moisturizing lotion as directed Prim Dressing: Promogran Prisma Matrix, 4.34 (sq in) (silver collagen) (DME) (Generic) 1 x Per Day/30 Days ary Discharge Instructions: Moisten collagen with saline or hydrogel Secondary Dressing: ABD Pad, 8x10 (DME) (Generic) 1 x Per Day/30 Days Discharge Instructions: Apply over primary dressing as directed. Secondary Dressing: Optifoam Non-Adhesive Dressing, 4x4 in (DME) (Generic) 1 x Per Day/30 Days Discharge Instructions: Apply over primary dressing as directed. Secondary Dressing: NonWoven Sponge, 4x4 (in/in) (Generic) 1 x Per Day/30 Days Secured With: 71M Medipore H Soft Cloth Surgical T ape, 4 x 10 (in/yd) (Generic) 1 x Per Day/30 Days Discharge Instructions: Secure with tape as directed. Com pression Wrap: Kerlix Roll 4.5x3.1 (in/yd) (DME) (Generic) 1 x Per Day/30 Days Discharge Instructions: Apply Kerlix and Coban compression as directed. Com pression Wrap: 71M ACE Elastic Bandage With VELCRO Brand Closure, 4 (in) (DME) (Generic) 1 x Per Day/30 Days WOUND #8: - Foot Wound Laterality: Right, Lateral Cleanser: Soap and Water 1 x Per Day/30 Days Discharge Instructions: May shower and wash wound with dial antibacterial soap and water prior to dressing change. Peri-Wound Care: Sween Lotion (Moisturizing lotion) 1 x Per Day/30 Days Discharge Instructions: Apply moisturizing lotion as directed Prim Dressing: Promogran Prisma Matrix, 4.34 (sq in) (silver collagen) (DME) (Generic) 1 x Per Day/30 Days ary Discharge Instructions: Moisten collagen with saline or hydrogel Secondary Dressing: ABD Pad, 8x10 (DME) (Generic) 1 x Per Day/30 Days Discharge Instructions: Apply over primary dressing as directed. Secondary Dressing: Optifoam Non-Adhesive Dressing, 4x4 in (DME) (Generic) 1 x Per Day/30 Days Discharge Instructions: Apply  over primary dressing as directed. Secondary Dressing: NonWoven Sponge, 4x4 (in/in) (Generic) 1 x Per Day/30 Days Secured With: 71M Medipore H Soft Cloth Surgical T ape, 4 x 10 (in/yd) (Generic) 1 x Per Day/30 Days Discharge Instructions: Secure with tape as directed. Com pression Wrap: Kerlix Roll 4.5x3.1 (in/yd) (DME) (Generic) 1 x Per Day/30 Days Discharge Instructions: Apply Kerlix and Coban compression as directed. Com pression Wrap: 71M ACE Elastic Bandage With VELCRO Brand Closure, 4 (in) (DME) (Generic) 1 x Per Day/30 Days 12/29/2021: Both ankle wounds remain closed. The right plantar foot wound is less than half the size that it was last week. There is no depth any longer and the surface has just a little bit of slough and periwound callus. On the left, the wound is not much smaller, but the surface is healthier with good granulation tissue. There is also a little slough and periwound callus buildup. I used a curette to debride slough, biofilm, and periwound callus from both feet. We will continue to use the Prisma silver collagen bilaterally. Follow-up in 1 week. Electronic Signature(s) Signed: 12/29/2021 8:47:47 AM By: [name] MD FACS Entered By: [name] on 12/29/2021 08:47:47 -------------------------------------------------------------------------------- HxROS Details Patient Name: Date of Service: [name], [name] 12/29/2021 8:15 A M Medical Record Number: [id number] Patient Account Number: [id number] Date of Birth/Sex: Treating RN: Oct 10, 1985 (36 y.o. M) Primary Care Provider: [name] Other Clinician: Referring Provider: Treating Provider/Extender: [name] in Treatment: 113 Information Obtained From Patient Eyes Medical History: Negative for: Cataracts; Glaucoma; Optic Neuritis Ear/Nose/Mouth/Throat Medical History: Negative  for: Chronic sinus problems/congestion; Middle ear problems Hematologic/Lymphatic Medical  History: Negative for: Anemia; Hemophilia; Human Immunodeficiency Virus; Lymphedema; Sickle Cell Disease Respiratory Medical History: Negative for: Aspiration; Asthma; Chronic Obstructive Pulmonary Disease (COPD); Pneumothorax; Sleep Apnea; Tuberculosis Cardiovascular Medical History: Negative for: Angina; Arrhythmia; Congestive Heart Failure; Coronary Artery Disease; Deep Vein Thrombosis; Hypertension; Hypotension; Myocardial Infarction; Peripheral Arterial Disease; Peripheral Venous Disease; Phlebitis; Vasculitis Gastrointestinal Medical History: Negative for: Cirrhosis ; Colitis; Crohns; Hepatitis A; Hepatitis B; Hepatitis C Endocrine Medical History: Positive for: Type II Diabetes Negative for: Type I Diabetes Time with diabetes: 8 Treated with: Insulin Blood sugar tested every day: No Immunological Medical History: Negative for: Lupus Erythematosus; Raynauds; Scleroderma Integumentary (Skin) Medical History: Negative for: History of Burn Musculoskeletal Medical History: Negative for: Gout; Rheumatoid Arthritis; Osteoarthritis; Osteomyelitis Neurologic Medical History: Negative for: Dementia; Neuropathy; Quadriplegia; Paraplegia; Seizure Disorder Oncologic Medical History: Negative for: Received Chemotherapy; Received Radiation Psychiatric Medical History: Negative for: Anorexia/bulimia; Confinement Anxiety Immunizations Pneumococcal Vaccine: Received Pneumococcal Vaccination: No Implantable Devices None Hospitalization / Surgery History Type of Hospitalization/Surgery 11/1-11/06/2018- sepsis foot infection 11/4-11/5 02 sats low respiratory distress Family and Social History Cancer: No; Diabetes: No; Hereditary Spherocytosis: No; Hypertension: No; Kidney Disease: No; Lung Disease: No; Seizures: No; Stroke: No; Thyroid Problems: No; Tuberculosis: No; Never smoker; Marital Status - Single; Alcohol Use: Rarely; Drug Use: No History; Caffeine Use: Never;  Financial Concerns: No; Food, Clothing or Shelter Needs: No; Support System Lacking: No; Transportation Concerns: No Electronic Signature(s) Signed: 12/29/2021 12:22:25 PM By: [name] MD FACS Entered By: [name] on 12/29/2021 08:43:32 -------------------------------------------------------------------------------- SuperBill Details Patient Name: Date of Service: [name], [name] 12/29/2021 Medical Record Number: [id number] Patient Account Number: [id number] Date of Birth/Sex: Treating RN: 12-07-1985 (36 y.o. M) Primary Care Provider: [name] Other Clinician: Referring Provider: Treating Provider/Extender: [name] in Treatment: 113 Diagnosis Coding ICD-10 Codes Code Description E11.621 Type 2 diabetes mellitus with foot ulcer L97.528 Non-pressure chronic ulcer of other part of left foot with other specified severity L97.518 Non-pressure chronic ulcer of other part of right foot with other specified severity Facility Procedures CPT4 Code: [id number] Description: [phone] - DEBRIDE WOUND 1ST 20 SQ CM OR < ICD-10 Diagnosis Description L97.528 Non-pressure chronic ulcer of other part of left foot with other specified severi L97.518 Non-pressure chronic ulcer of other part of right foot with other  specified sever Modifier: ty ity Quantity: 1 Physician Procedures : CPT4 Code Description Modifier [id number] - WC PHYS LEVEL 3 - EST PT 25 ICD-10 Diagnosis Description E11.621 Type 2 diabetes mellitus 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

## 2021-12-29 NOTE — Progress Notes (Signed)
[place], [name] ([id number]) Visit Report for 12/29/2021 Arrival Information Details Patient Name: Date of Service: [name] 12/29/2021 8:15 A M Medical Record Number: [id number] Patient Account Number: [id number] Date of Birth/Sex: Treating RN: Jan 22, 1986 (36 y.o. [name] Primary Care Torria Fromer: [name] Other Clinician: Referring Pete Merten: Treating Ardys Hataway/Extender: [name] in Treatment: 55 Visit Information History Since Last Visit Added or deleted any medications: No Patient Arrived: Ambulatory Any new allergies or adverse reactions: No Arrival Time: 08:15 Had a fall or experienced change in No Accompanied By: self activities of daily living that may affect Transfer Assistance: None risk of falls: Patient Identification Verified: Yes Signs or symptoms of abuse/neglect since last visito No Secondary Verification Process Completed: Yes Hospitalized since last visit: No Patient Requires Transmission-Based Precautions: No Implantable device outside of the clinic excluding No Patient Has Alerts: No cellular tissue based products placed in the center since last visit: Has Dressing in Place as Prescribed: Yes Pain Present Now: No Electronic Signature(s) Signed: 12/29/2021 5:00:18 PM By: Adline Peals Entered By: Adline Peals on 12/29/2021 08:16:11 -------------------------------------------------------------------------------- Encounter Discharge Information Details Patient Name: Date of Service: [name], [name] 12/29/2021 8:15 A M Medical Record Number: [id number] Patient Account Number: [id number] Date of Birth/Sex: Treating RN: 05-Aug-1985 (36 y.o. [name] Primary Care Renan Danese: [name] Other Clinician: Referring Jamine Wingate: Treating Orvin Netter/Extender: [name] in Treatment: 113 Encounter Discharge Information Items Post Procedure Vitals Discharge Condition:  Stable Temperature (F): 98.9 Ambulatory Status: Ambulatory Pulse (bpm): 103 Discharge Destination: Home Respiratory Rate (breaths/min): 18 Transportation: Private Auto Blood Pressure (mmHg): 137/85 Accompanied By: self Schedule Follow-up Appointment: Yes Clinical Summary of Care: Patient Declined Electronic Signature(s) Signed: 12/29/2021 2:21:05 PM By: [name] RN, BSN Entered By: [name] on 12/29/2021 09:56:10 -------------------------------------------------------------------------------- Lower Extremity Assessment Details Patient Name: Date of Service: [name], [name] 12/29/2021 8:15 A M Medical Record Number: [id number] Patient Account Number: [id number] Date of Birth/Sex: Treating RN: [birth year] (36 y.o. [name] Primary Care Layan Zalenski: [name] Other Clinician: Referring Ginni Eichler: Treating Keeanna Villafranca/Extender: [name] in Treatment: 113 Edema Assessment Assessed: [name]: No] [name]: No] Edema: [Left: Yes] [Right: Yes] Calf Left: Right: Point of Measurement: 48 cm From Medial Instep 48.3 cm 47.5 cm Ankle Left: Right: Point of Measurement: 11 cm From Medial Instep 27.4 cm 27.4 cm Vascular Assessment Pulses: Dorsalis Pedis Palpable: [Left:Yes] [Right:Yes] Electronic Signature(s) Signed: 12/29/2021 5:00:18 PM By: Adline Peals Entered By: Adline Peals on 12/29/2021 08:20:55 -------------------------------------------------------------------------------- Multi Wound Chart Details Patient Name: Date of Service: [name], [name] 12/29/2021 8:15 A M Medical Record Number: [id number] Patient Account Number: [id number] Date of Birth/Sex: Treating RN: 22-Sep-1985 (36 y.o. M) Primary Care Maekayla Giorgio: [name] Other Clinician: Referring Cathryn Gallery: Treating Chason Mciver/Extender: [name] in Treatment: 113 Vital Signs Height(in): 77 Capillary Blood Glucose(mg/dl):  107 Weight(lbs): 280 Pulse(bpm): 103 Body Mass Index(BMI): 33.2 Blood Pressure(mmHg): 137/85 Temperature(F): 98.9 Respiratory Rate(breaths/min): 18 Photos: [3:Left, Lateral, Plantar Foot] [8:Right, Lateral Foot] [N/A:N/A N/A] Wound Location: [3:Trauma] [8:Blister] [N/A:N/A] Wounding Event: [3:Diabetic Wound/Ulcer of the Lower] [8:Diabetic Wound/Ulcer of the Lower] [N/A:N/A] Primary Etiology: [3:Extremity Type II Diabetes] [8:Extremity Type II Diabetes] [N/A:N/A] Comorbid History: [3:10/02/2019] [8:04/18/2021] [N/A:N/A] Date Acquired: [3:113] [8:36] [N/A:N/A] Weeks of Treatment: [3:Open] [8:Open] [N/A:N/A] Wound Status: [3:No] [8:No] [N/A:N/A] Wound Recurrence: [3:2.9x3.9x0.3] [8:1.4x1.6x0.1] [N/A:N/A] Measurements L x W x [name] [3:8.883] [8:1.759] [N/A:N/A] A (cm) : rea [3:2.665] [8:0.176] [N/A:N/A] Volume (cm) : [3:-438.70%] [  8:-219.80%] [N/A:N/A] % Reduction in A [3:rea: -1515.20%] [8:-60.00%] [N/A:N/A] % Reduction in Volume: [3:Grade 2] [8:Grade 2] [N/A:N/A] Classification: [3:Medium] [8:Medium] [N/A:N/A] Exudate A mount: [3:Serosanguineous] [8:Serosanguineous] [N/A:N/A] Exudate Type: [3:red, brown] [8:red, brown] [N/A:N/A] Exudate Color: [3:Well defined, not attached] [8:Well defined, not attached] [N/A:N/A] Wound Margin: [3:Large (67-100%)] [8:Large (67-100%)] [N/A:N/A] Granulation A mount: [3:Red, Hyper-granulation] [8:Red, Pink] [N/A:N/A] Granulation Quality: [3:Small (1-33%)] [8:Small (1-33%)] [N/A:N/A] Necrotic A mount: [3:Fat Layer (Subcutaneous Tissue): Yes Fat Layer (Subcutaneous Tissue): Yes N/A] Exposed Structures: [3:Fascia: No Tendon: No Muscle: No Joint: No Bone: No Small (1-33%)] [8:Fascia: No Tendon: No Muscle: No Joint: No Bone: No Medium (34-66%)] [N/A:N/A] Epithelialization: [3:Debridement - Selective/Open Wound Debridement - Selective/Open Wound N/A] Debridement: Pre-procedure Verification/Time Out 08:36 [8:08:36] [N/A:N/A] Taken: [3:Callus] [8:Callus]  [N/A:N/A] Tissue Debrided: [3:Non-Viable Tissue] [8:Non-Viable Tissue] [N/A:N/A] Level: [3:11.31] [8:2.24] [N/A:N/A] Debridement A (sq cm): [3:rea Curette] [8:Curette] [N/A:N/A] Instrument: [3:Minimum] [8:Minimum] [N/A:N/A] Bleeding: [3:Pressure] [8:Pressure] [N/A:N/A] Hemostasis A chieved: [3:0] [8:0] [N/A:N/A] Procedural Pain: [3:0] [8:0] [N/A:N/A] Post Procedural Pain: [3:Procedure was tolerated well] [8:Procedure was tolerated well] [N/A:N/A] Debridement Treatment Response: [3:2.9x3.9x0.3] [8:1.4x1.6x0.1] [N/A:N/A] Post Debridement Measurements L x W x [name] [3:2.665] [8:0.176] [N/A:N/A] Post Debridement Volume: (cm) [3:Debridement] [8:Debridement] [N/A:N/A] Treatment Notes Electronic Signature(s) Signed: 12/29/2021 8:41:36 AM By: [name] MD FACS Entered By: [name] on 12/29/2021 08:41:36 -------------------------------------------------------------------------------- Multi-Disciplinary Care Plan Details Patient Name: Date of Service: [name], [name] 12/29/2021 8:15 A M Medical Record Number: [id number] Patient Account Number: [id number] Date of Birth/Sex: Treating RN: 1986/01/28 (36 y.o. [name] Primary Care Zeric Baranowski: [name] Other Clinician: Referring Charlissa Petros: Treating Keondrick Dilks/Extender: [name] in Treatment: [place] reviewed with physician Active Inactive Nutrition Nursing Diagnoses: Imbalanced nutrition Potential for alteratiion in Nutrition/Potential for imbalanced nutrition Goals: Patient/caregiver agrees to and verbalizes understanding of need to use nutritional supplements and/or vitamins as prescribed Date Initiated: 10/24/2019 Date Inactivated: 04/06/2020 Target Resolution Date: 04/03/2020 Goal Status: Met Patient/caregiver will maintain therapeutic glucose control Date Initiated: 10/24/2019 Target Resolution Date: 01/19/2022 Goal Status:  Active Interventions: Assess HgA1c results as ordered upon admission and as needed Assess patient nutrition upon admission and as needed per policy Provide education on elevated blood sugars and impact on wound healing Provide education on nutrition Treatment Activities: Education provided on Nutrition : 12/07/2021 Notes: 11/17/20: Glucose control ongoing issue, target date extended. 01/26/21: Glucose management continues. Wound/Skin Impairment Nursing Diagnoses: Impaired tissue integrity Knowledge deficit related to ulceration/compromised skin integrity Goals: Patient/caregiver will verbalize understanding of skin care regimen Date Initiated: 10/24/2019 Target Resolution Date: 01/19/2022 Goal Status: Active Ulcer/skin breakdown will have a volume reduction of 30% by week 4 Date Initiated: 10/24/2019 Date Inactivated: 01/16/2020 Target Resolution Date: 01/10/2020 Unmet Reason: no change in Goal Status: Unmet measurements. Interventions: Assess patient/caregiver ability to obtain necessary supplies Assess patient/caregiver ability to perform ulcer/skin care regimen upon admission and as needed Assess ulceration(s) every visit Provide education on ulcer and skin care Notes: 11/17/20: Wound care regimen continues Electronic Signature(s) Signed: 12/29/2021 5:00:18 PM By: Adline Peals Entered By: Adline Peals on 12/29/2021 08:25:48 -------------------------------------------------------------------------------- Pain Assessment Details Patient Name: Date of Service: [name], [name] 12/29/2021 8:15 A M Medical Record Number: [id number] Patient Account Number: [id number] Date of Birth/Sex: Treating RN: 1986-01-30 (36 y.o. [name] Primary Care Demeisha Geraghty: [name] Other Clinician: Referring Amandeep Hogston: Treating Jiovany Scheffel/Extender: [name] in Treatment: 113 Active Problems Location of Pain Severity and Description of  Pain Patient Has Paino No Site Locations  Rate the pain. Rate the pain. Current Pain Level: 0 Pain Management and Medication Current Pain Management: Electronic Signature(s) Signed: 12/29/2021 5:00:18 PM By: Adline Peals Entered By: Adline Peals on 12/29/2021 08:16:36 -------------------------------------------------------------------------------- Patient/Caregiver Education Details Patient Name: Date of Service: [name], [name] 9/27/2023andnbsp8:15 A M Medical Record Number: [id number] Patient Account Number: [id number] Date of Birth/Gender: Treating RN: May 22, 1985 (36 y.o. [name] Primary Care Physician: [name] Other Clinician: Referring Physician: Treating Physician/Extender: [name] in Treatment: 68 Education Assessment Education Provided To: Patient Education Topics Provided Wound/Skin Impairment: Methods: Explain/Verbal Responses: Reinforcements needed, State content correctly Electronic Signature(s) Signed: 12/29/2021 5:00:18 PM By: Adline Peals Entered By: Adline Peals on 12/29/2021 08:25:58 -------------------------------------------------------------------------------- Wound Assessment Details Patient Name: Date of Service: [name], [name] 12/29/2021 8:15 A M Medical Record Number: [id number] Patient Account Number: [id number] Date of Birth/Sex: Treating RN: [birth year] (36 y.o. [name] Primary Care Markisha Meding: [name] Other Clinician: Referring Ramya Vanbergen: Treating Kasie Leccese/Extender: [name] in Treatment: 113 Wound Status Wound Number: 3 Primary Etiology: Diabetic Wound/Ulcer of the Lower Extremity Wound Location: Left, Lateral, Plantar Foot Wound Status: Open Wounding Event: Trauma Comorbid History: Type II Diabetes Date Acquired: 10/02/2019 Weeks Of Treatment: 113 Clustered Wound: No Photos Wound Measurements Length: (cm)  2.9 Width: (cm) 3.9 Depth: (cm) 0.3 Area: (cm) 8.883 Volume: (cm) 2.665 % Reduction in Area: -438.7% % Reduction in Volume: -1515.2% Epithelialization: Small (1-33%) Tunneling: No Undermining: No Wound Description Classification: Grade 2 Wound Margin: Well defined, not attached Exudate Amount: Medium Exudate Type: Serosanguineous Exudate Color: red, brown Foul Odor After Cleansing: No Slough/Fibrino Yes Wound Bed Granulation Amount: Large (67-100%) Exposed Structure Granulation Quality: Red, Hyper-granulation Fascia Exposed: No Necrotic Amount: Small (1-33%) Fat Layer (Subcutaneous Tissue) Exposed: Yes Necrotic Quality: Adherent Slough Tendon Exposed: No Muscle Exposed: No Joint Exposed: No Bone Exposed: No Treatment Notes Wound #3 (Foot) Wound Laterality: Plantar, Left, Lateral Cleanser Soap and Water Discharge Instruction: May shower and wash wound with dial antibacterial soap and water prior to dressing change. Peri-Wound Care Zinc Oxide Ointment 30g tube Discharge Instruction: Apply Zinc Oxide to periwound with each dressing change Sween Lotion (Moisturizing lotion) Discharge Instruction: Apply moisturizing lotion as directed Topical Primary Dressing Promogran Prisma Matrix, 4.34 (sq in) (silver collagen) Discharge Instruction: Moisten collagen with saline or hydrogel Secondary Dressing ABD Pad, 8x10 Discharge Instruction: Apply over primary dressing as directed. Optifoam Non-Adhesive Dressing, 4x4 in Discharge Instruction: Apply over primary dressing as directed. NonWoven Sponge, 4x4 (in/in) Secured With 52M Medipore H Soft Cloth Surgical T ape, 4 x 10 (in/yd) Discharge Instruction: Secure with tape as directed. Compression Wrap Kerlix Roll 4.5x3.1 (in/yd) Discharge Instruction: Apply Kerlix and Coban compression as directed. 52M ACE Elastic Bandage With VELCRO Brand Closure, 4 (in) Compression Stockings Add-Ons Electronic Signature(s) Signed: 12/29/2021  5:00:18 PM By: Adline Peals Entered By: Adline Peals on 12/29/2021 08:24:50 -------------------------------------------------------------------------------- Wound Assessment Details Patient Name: Date of Service: [name] 12/29/2021 8:15 A M Medical Record Number: [id number] Patient Account Number: [id number] Date of Birth/Sex: Treating RN: 1985/07/23 (36 y.o. [name] Primary Care Natonya Finstad: [name] Other Clinician: Referring Marquan Vokes: Treating Jalyric Kaestner/Extender: [name] in Treatment: 113 Wound Status Wound Number: 8 Primary Etiology: Diabetic Wound/Ulcer of the Lower Extremity Wound Location: Right, Lateral Foot Wound Status: Open Wounding Event: Blister Comorbid History: Type II Diabetes Date Acquired: 04/18/2021 Weeks Of Treatment: 36 Clustered Wound: No Photos Wound Measurements Length: (cm) 1.4  Width: (cm) 1.6 Depth: (cm) 0.1 Area: (cm) 1.759 Volume: (cm) 0.176 % Reduction in Area: -219.8% % Reduction in Volume: -60% Epithelialization: Medium (34-66%) Tunneling: No Undermining: No Wound Description Classification: Grade 2 Wound Margin: Well defined, not attached Exudate Amount: Medium Exudate Type: Serosanguineous Exudate Color: red, brown Foul Odor After Cleansing: No Slough/Fibrino Yes Wound Bed Granulation Amount: Large (67-100%) Exposed Structure Granulation Quality: Red, Pink Fascia Exposed: No Necrotic Amount: Small (1-33%) Fat Layer (Subcutaneous Tissue) Exposed: Yes Necrotic Quality: Adherent Slough Tendon Exposed: No Muscle Exposed: No Joint Exposed: No Bone Exposed: No Treatment Notes Wound #8 (Foot) Wound Laterality: Right, Lateral Cleanser Soap and Water Discharge Instruction: May shower and wash wound with dial antibacterial soap and water prior to dressing change. Peri-Wound Care Sween Lotion (Moisturizing lotion) Discharge Instruction: Apply moisturizing lotion as  directed Topical Primary Dressing Promogran Prisma Matrix, 4.34 (sq in) (silver collagen) Discharge Instruction: Moisten collagen with saline or hydrogel Secondary Dressing ABD Pad, 8x10 Discharge Instruction: Apply over primary dressing as directed. Optifoam Non-Adhesive Dressing, 4x4 in Discharge Instruction: Apply over primary dressing as directed. NonWoven Sponge, 4x4 (in/in) Secured With 259M Medipore H Soft Cloth Surgical T ape, 4 x 10 (in/yd) Discharge Instruction: Secure with tape as directed. Compression Wrap Kerlix Roll 4.5x3.1 (in/yd) Discharge Instruction: Apply Kerlix and Coban compression as directed. 259M ACE Elastic Bandage With VELCRO Brand Closure, 4 (in) Compression Stockings Add-Ons Electronic Signature(s) Signed: 12/29/2021 5:00:18 PM By: Adline Peals Entered By: Adline Peals on 12/29/2021 08:25:17 -------------------------------------------------------------------------------- Vitals Details Patient Name: Date of Service: [name], [name] 12/29/2021 8:15 A M Medical Record Number: [id number] Patient Account Number: [id number] Date of Birth/Sex: Treating RN: 1985/10/23 (36 y.o. [name] Primary Care Erinn Huskins: [name] Other Clinician: Referring Emmilynn Marut: Treating Aldrich Lloyd/Extender: [name] in Treatment: 113 Vital Signs Time Taken: 08:16 Temperature (F): 98.9 Height (in): 77 Pulse (bpm): 103 Weight (lbs): 280 Respiratory Rate (breaths/min): 18 Body Mass Index (BMI): 33.2 Blood Pressure (mmHg): 137/85 Capillary Blood Glucose (mg/dl): 107 Reference Range: 80 - 120 mg / dl Electronic Signature(s) Signed: 12/29/2021 5:00:18 PM By: Adline Peals Entered By: Adline Peals on 12/29/2021 08:16:30

## 2022-01-05 ENCOUNTER — Encounter (HOSPITAL_BASED_OUTPATIENT_CLINIC_OR_DEPARTMENT_OTHER): Payer: BC Managed Care – PPO | Attending: General Surgery | Admitting: General Surgery

## 2022-01-05 DIAGNOSIS — Z8616 Personal history of COVID-19: Secondary | ICD-10-CM | POA: Insufficient documentation

## 2022-01-05 DIAGNOSIS — L97518 Non-pressure chronic ulcer of other part of right foot with other specified severity: Secondary | ICD-10-CM | POA: Insufficient documentation

## 2022-01-05 DIAGNOSIS — L97528 Non-pressure chronic ulcer of other part of left foot with other specified severity: Secondary | ICD-10-CM | POA: Diagnosis not present

## 2022-01-05 DIAGNOSIS — E11621 Type 2 diabetes mellitus with foot ulcer: Secondary | ICD-10-CM | POA: Diagnosis present

## 2022-01-05 NOTE — Progress Notes (Signed)
[name], [name] ([id number]) Visit Report for 01/05/2022 Arrival Information Details Patient Name: Date of Service: [name] 01/05/2022 8:15 A M Medical Record Number: [id number] Patient Account Number: [id number] Date of Birth/Sex: Treating RN: 1985-05-08 (36 y.o. [name] Primary Care Lenoir Facchini: [name] Other Clinician: Referring Jennifermarie Franzen: Treating Juanice Warburton/Extender: [name] in Treatment: 48 Visit Information History Since Last Visit Added or deleted any medications: No Patient Arrived: Ambulatory Any new allergies or adverse reactions: No Arrival Time: 08:17 Had a fall or experienced change in No Accompanied By: self activities of daily living that may affect Transfer Assistance: None risk of falls: Patient Identification Verified: Yes Signs or symptoms of abuse/neglect since last visito No Secondary Verification Process Completed: Yes Hospitalized since last visit: No Patient Requires Transmission-Based Precautions: No Implantable device outside of the clinic excluding No Patient Has Alerts: No cellular tissue based products placed in the center since last visit: Has Dressing in Place as Prescribed: Yes Pain Present Now: No Electronic Signature(s) Signed: 01/05/2022 5:47:46 PM By: [name] RN, BSN Entered By: [name] on 01/05/2022 08:17:44 -------------------------------------------------------------------------------- Encounter Discharge Information Details Patient Name: Date of Service: [name], [name] 01/05/2022 8:15 A M Medical Record Number: [id number] Patient Account Number: [id number] Date of Birth/Sex: Treating RN: [birth year] (36 y.o. [name] Primary Care Jlyn Bracamonte: [name] Other Clinician: Referring Breckan Cafiero: Treating Mihailo Sage/Extender: [name] in Treatment: 114 Encounter Discharge Information Items Post Procedure Vitals Discharge Condition:  Stable Temperature (F): 98.0 Ambulatory Status: Ambulatory Pulse (bpm): 87 Discharge Destination: Home Respiratory Rate (breaths/min): 18 Transportation: Private Auto Blood Pressure (mmHg): 147/95 Accompanied By: self Schedule Follow-up Appointment: Yes Clinical Summary of Care: Patient Declined Electronic Signature(s) Signed: 01/05/2022 5:47:46 PM By: [name] RN, BSN Entered By: [name] on 01/05/2022 08:56:54 -------------------------------------------------------------------------------- Lower Extremity Assessment Details Patient Name: Date of Service: [name], [name] 01/05/2022 8:15 A M Medical Record Number: [id number] Patient Account Number: [id number] Date of Birth/Sex: Treating RN: 27-Sep-1985 (36 y.o. [name] Primary Care [name]: [name] Other Clinician: Referring Kachina Niederer: Treating Hipolito Martinezlopez/Extender: [name] in Treatment: 114 Edema Assessment Assessed: [name]: No] [name]: No] Edema: [Left: Yes] [Right: Yes] Calf Left: Right: Point of Measurement: 48 cm From Medial Instep 47 cm 46.5 cm Ankle Left: Right: Point of Measurement: 11 cm From Medial Instep 27 cm 27 cm Vascular Assessment Pulses: Dorsalis Pedis Palpable: [Left:Yes] [Right:Yes] Electronic Signature(s) Signed: 01/05/2022 5:47:46 PM By: [name] RN, BSN Entered By: [name] on 01/05/2022 08:30:24 -------------------------------------------------------------------------------- Multi Wound Chart Details Patient Name: Date of Service: [name], [name] 01/05/2022 8:15 A M Medical Record Number: [id number] Patient Account Number: [id number] Date of Birth/Sex: Treating RN: [birth year] (36 y.o. M) Primary Care Krrish Freund: [name] Other Clinician: Referring Jacie Tristan: Treating Kayler Buckholtz/Extender: [name] in Treatment: 114 Vital Signs Height(in): 77 Pulse(bpm): 87 Weight(lbs): 280 Blood  Pressure(mmHg): 147/95 Body Mass Index(BMI): 33.2 Temperature(F): 98.0 Respiratory Rate(breaths/min): 18 Photos: [3:Left, Lateral, Plantar Foot] [8:Right, Lateral Foot] [N/A:N/A N/A] Wound Location: [3:Trauma] [8:Blister] [N/A:N/A] Wounding Event: [3:Diabetic Wound/Ulcer of the Lower] [8:Diabetic Wound/Ulcer of the Lower] [N/A:N/A] Primary Etiology: [3:Extremity Type II Diabetes] [8:Extremity Type II Diabetes] [N/A:N/A] Comorbid History: [3:10/02/2019] [8:04/18/2021] [N/A:N/A] Date Acquired: [3:114] [8:37] [N/A:N/A] Weeks of Treatment: [3:Open] [8:Open] [N/A:N/A] Wound Status: [3:No] [8:No] [N/A:N/A] Wound Recurrence: [3:3x3.2x0.2] [8:1.3x1.9x0.1] [N/A:N/A] Measurements L x W x [name] [3:7.54] [8:1.94] [N/A:N/A] A (cm) : rea [3:1.508] [2:5.427] [N/A:N/A] Volume (cm) : [3:-357.20%] [  8:-252.70%] [N/A:N/A] % Reduction in A [3:rea: -813.90%] [8:-76.40%] [N/A:N/A] % Reduction in Volume: [3:Grade 2] [8:Grade 2] [N/A:N/A] Classification: [3:Medium] [8:Medium] [N/A:N/A] Exudate A mount: [3:Serosanguineous] [8:Serosanguineous] [N/A:N/A] Exudate Type: [3:red, brown] [8:red, brown] [N/A:N/A] Exudate Color: [3:Well defined, not attached] [8:Well defined, not attached] [N/A:N/A] Wound Margin: [3:Large (67-100%)] [8:Large (67-100%)] [N/A:N/A] Granulation A mount: [3:Red, Hyper-granulation] [8:Red, Pink] [N/A:N/A] Granulation Quality: [3:Small (1-33%)] [8:Small (1-33%)] [N/A:N/A] Necrotic A mount: [3:Fat Layer (Subcutaneous Tissue): Yes Fat Layer (Subcutaneous Tissue): Yes N/A] Exposed Structures: [3:Fascia: No Tendon: No Muscle: No Joint: No Bone: No Small (1-33%)] [8:Fascia: No Tendon: No Muscle: No Joint: No Bone: No Medium (34-66%)] [N/A:N/A] Epithelialization: [3:Debridement - Selective/Open Wound Debridement - Selective/Open Wound N/A] Debridement: Pre-procedure Verification/Time Out 08:36 [8:08:36] [N/A:N/A] Taken: [3:Callus] [8:Callus, Slough] [N/A:N/A] Tissue Debrided: [3:Non-Viable  Tissue] [8:Non-Viable Tissue] [N/A:N/A] Level: [3:9.6] [8:2.47] [N/A:N/A] Debridement A (sq cm): [3:rea Curette] [8:Curette] [N/A:N/A] Instrument: [3:Minimum] [8:Minimum] [N/A:N/A] Bleeding: [3:Pressure] [8:Pressure] [N/A:N/A] Hemostasis A chieved: [3:0] [8:0] [N/A:N/A] Procedural Pain: [3:0] [8:0] [N/A:N/A] Post Procedural Pain: [3:Procedure was tolerated well] [8:Procedure was tolerated well] [N/A:N/A] Debridement Treatment Response: [3:3x3.2x0.2] [8:1.3x1.9x0.1] [N/A:N/A] Post Debridement Measurements L x W x [name] [3:1.508] [8:0.194] [N/A:N/A] Post Debridement Volume: (cm) [3:Callus: Yes] [8:Callus: Yes] [N/A:N/A] Periwound Skin Texture: [3:No Abnormalities Noted] [8:No Abnormalities Noted] [N/A:N/A] Periwound Skin Moisture: [3:No Abnormalities Noted] [8:No Abnormalities Noted] [N/A:N/A] Periwound Skin Color: [3:No Abnormality] [8:No Abnormality] [N/A:N/A] Temperature: [3:Debridement] [8:Debridement] [N/A:N/A] Treatment Notes Electronic Signature(s) Signed: 01/05/2022 8:39:50 AM By: [name] MD FACS Entered By: [name] on 01/05/2022 08:39:50 -------------------------------------------------------------------------------- Multi-Disciplinary Care Plan Details Patient Name: Date of Service: [name], [name] 01/05/2022 8:15 A M Medical Record Number: [id number] Patient Account Number: [id number] Date of Birth/Sex: Treating RN: September 12, 1985 (36 y.o. [name] Primary Care Cherri Yera: [name] Other Clinician: Referring Xylon Croom: Treating Talaysha Freeberg/Extender: [name] in Treatment: [place] reviewed with physician Active Inactive Nutrition Nursing Diagnoses: Imbalanced nutrition Potential for alteratiion in Nutrition/Potential for imbalanced nutrition Goals: Patient/caregiver agrees to and verbalizes understanding of need to use nutritional supplements and/or vitamins as prescribed Date Initiated:  10/24/2019 Date Inactivated: 04/06/2020 Target Resolution Date: 04/03/2020 Goal Status: Met Patient/caregiver will maintain therapeutic glucose control Date Initiated: 10/24/2019 Target Resolution Date: 01/19/2022 Goal Status: Active Interventions: Assess HgA1c results as ordered upon admission and as needed Assess patient nutrition upon admission and as needed per policy Provide education on elevated blood sugars and impact on wound healing Provide education on nutrition Treatment Activities: Education provided on Nutrition : 11/24/2021 Notes: 11/17/20: Glucose control ongoing issue, target date extended. 01/26/21: Glucose management continues. Wound/Skin Impairment Nursing Diagnoses: Impaired tissue integrity Knowledge deficit related to ulceration/compromised skin integrity Goals: Patient/caregiver will verbalize understanding of skin care regimen Date Initiated: 10/24/2019 Target Resolution Date: 01/19/2022 Goal Status: Active Ulcer/skin breakdown will have a volume reduction of 30% by week 4 Date Initiated: 10/24/2019 Date Inactivated: 01/16/2020 Target Resolution Date: 01/10/2020 Unmet Reason: no change in Goal Status: Unmet measurements. Interventions: Assess patient/caregiver ability to obtain necessary supplies Assess patient/caregiver ability to perform ulcer/skin care regimen upon admission and as needed Assess ulceration(s) every visit Provide education on ulcer and skin care Notes: 11/17/20: Wound care regimen continues Electronic Signature(s) Signed: 01/05/2022 5:47:46 PM By: [name] RN, BSN Entered By: [name] on 01/05/2022 08:34:06 -------------------------------------------------------------------------------- Pain Assessment Details Patient Name: Date of Service: [name], [name] 01/05/2022 8:15 A M Medical Record Number: [id number] Patient Account Number: [id number] Date of Birth/Sex: Treating RN: 1985-10-30 (36 y.o. Joylene Igo,  Morey Hummingbird Primary  Care Mayuri Staples: [name] Other Clinician: Referring Salem Lembke: Treating Janese Radabaugh/Extender: [name] in Treatment: 114 Active Problems Location of Pain Severity and Description of Pain Patient Has Paino No Site Locations Pain Management and Medication Current Pain Management: Electronic Signature(s) Signed: 01/05/2022 5:47:46 PM By: [name] RN, BSN Entered By: [name] on 01/05/2022 08:18:31 -------------------------------------------------------------------------------- Patient/Caregiver Education Details Patient Name: Date of Service: [name], [name] 10/4/2023andnbsp8:15 A M Medical Record Number: [id number] Patient Account Number: [id number] Date of Birth/Gender: Treating RN: Aug 22, 1985 (36 y.o. [name] Primary Care Physician: [name] Other Clinician: Referring Physician: Treating Physician/Extender: [name] in Treatment: 51 Education Assessment Education Provided To: Patient Education Topics Provided Wound/Skin Impairment: Methods: Explain/Verbal Responses: State content correctly [employer]) Signed: 01/05/2022 5:47:46 PM By: [name] RN, BSN Entered By: [name] on 01/05/2022 08:34:26 -------------------------------------------------------------------------------- Wound Assessment Details Patient Name: Date of Service: [name], [name] 01/05/2022 8:15 A M Medical Record Number: [id number] Patient Account Number: [id number] Date of Birth/Sex: Treating RN: [birth year] (36 y.o. [name] Primary Care Brook Geraci: [name] Other Clinician: Referring Naela Nodal: Treating Travian Kerner/Extender: [name] in Treatment: 114 Wound Status Wound Number: 3 Primary Etiology: Diabetic Wound/Ulcer of the Lower Extremity Wound Location: Left, Lateral, Plantar Foot Wound Status: Open Wounding Event: Trauma Comorbid History:  Type II Diabetes Date Acquired: 10/02/2019 Weeks Of Treatment: 114 Clustered Wound: No Photos Wound Measurements Length: (cm) 3 Width: (cm) 3.2 Depth: (cm) 0.2 Area: (cm) 7.54 Volume: (cm) 1.508 % Reduction in Area: -357.2% % Reduction in Volume: -813.9% Epithelialization: Small (1-33%) Tunneling: No Undermining: No Wound Description Classification: Grade 2 Wound Margin: Well defined, not attached Exudate Amount: Medium Exudate Type: Serosanguineous Exudate Color: red, brown Foul Odor After Cleansing: No Slough/Fibrino Yes Wound Bed Granulation Amount: Large (67-100%) Exposed Structure Granulation Quality: Red, Hyper-granulation Fascia Exposed: No Necrotic Amount: Small (1-33%) Fat Layer (Subcutaneous Tissue) Exposed: Yes Necrotic Quality: Adherent Slough Tendon Exposed: No Muscle Exposed: No Joint Exposed: No Bone Exposed: No Periwound Skin Texture Texture Color No Abnormalities Noted: No No Abnormalities Noted: Yes Callus: Yes Temperature / Pain Temperature: No Abnormality Moisture No Abnormalities Noted: Yes Treatment Notes Wound #3 (Foot) Wound Laterality: Plantar, Left, Lateral Cleanser Soap and Water Discharge Instruction: May shower and wash wound with dial antibacterial soap and water prior to dressing change. Peri-Wound Care Zinc Oxide Ointment 30g tube Discharge Instruction: Apply Zinc Oxide to periwound with each dressing change Sween Lotion (Moisturizing lotion) Discharge Instruction: Apply moisturizing lotion as directed Topical Primary Dressing Promogran Prisma Matrix, 4.34 (sq in) (silver collagen) Discharge Instruction: Moisten collagen with saline or hydrogel Secondary Dressing ABD Pad, 8x10 Discharge Instruction: Apply over primary dressing as directed. Optifoam Non-Adhesive Dressing, 4x4 in Discharge Instruction: Apply over primary dressing as directed. NonWoven Sponge, 4x4 (in/in) Secured With 31M Medipore H Soft Cloth Surgical T  ape, 4 x 10 (in/yd) Discharge Instruction: Secure with tape as directed. Compression Wrap Kerlix Roll 4.5x3.1 (in/yd) Discharge Instruction: Apply Kerlix and Coban compression as directed. 31M ACE Elastic Bandage With VELCRO Brand Closure, 4 (in) Compression Stockings Add-Ons Electronic Signature(s) Signed: 01/05/2022 5:47:46 PM By: [name] RN, BSN Entered By: [name] on 01/05/2022 08:32:16 -------------------------------------------------------------------------------- Wound Assessment Details Patient Name: Date of Service: [name], [name] 01/05/2022 8:15 A M Medical Record Number: [id number] Patient Account Number: [id number] Date of Birth/Sex: Treating RN: 1986/03/26 (36 y.o. [name] Primary Care Lonie Newsham: [name] Other Clinician:  Referring Chrisanne Loose: Treating Leeza Heiner/Extender: [name] in Treatment: 114 Wound Status Wound Number: 8 Primary Etiology: Diabetic Wound/Ulcer of the Lower Extremity Wound Location: Right, Lateral Foot Wound Status: Open Wounding Event: Blister Comorbid History: Type II Diabetes Date Acquired: 04/18/2021 Weeks Of Treatment: 37 Clustered Wound: No Photos Wound Measurements Length: (cm) 1.3 Width: (cm) 1.9 Depth: (cm) 0.1 Area: (cm) 1.94 Volume: (cm) 0.194 % Reduction in Area: -252.7% % Reduction in Volume: -76.4% Epithelialization: Medium (34-66%) Tunneling: No Undermining: No Wound Description Classification: Grade 2 Wound Margin: Well defined, not attached Exudate Amount: Medium Exudate Type: Serosanguineous Exudate Color: red, brown Foul Odor After Cleansing: No Slough/Fibrino Yes Wound Bed Granulation Amount: Large (67-100%) Exposed Structure Granulation Quality: Red, Pink Fascia Exposed: No Necrotic Amount: Small (1-33%) Fat Layer (Subcutaneous Tissue) Exposed: Yes Necrotic Quality: Adherent Slough Tendon Exposed: No Muscle Exposed: No Joint Exposed: No Bone  Exposed: No Periwound Skin Texture Texture Color No Abnormalities Noted: No No Abnormalities Noted: Yes Callus: Yes Temperature / Pain Temperature: No Abnormality Moisture No Abnormalities Noted: Yes Treatment Notes Wound #8 (Foot) Wound Laterality: Right, Lateral Cleanser Soap and Water Discharge Instruction: May shower and wash wound with dial antibacterial soap and water prior to dressing change. Peri-Wound Care Sween Lotion (Moisturizing lotion) Discharge Instruction: Apply moisturizing lotion as directed Topical Primary Dressing Promogran Prisma Matrix, 4.34 (sq in) (silver collagen) Discharge Instruction: Moisten collagen with saline or hydrogel Secondary Dressing ABD Pad, 8x10 Discharge Instruction: Apply over primary dressing as directed. Optifoam Non-Adhesive Dressing, 4x4 in Discharge Instruction: Apply over primary dressing as directed. NonWoven Sponge, 4x4 (in/in) Secured With 80M Medipore H Soft Cloth Surgical T ape, 4 x 10 (in/yd) Discharge Instruction: Secure with tape as directed. Compression Wrap Kerlix Roll 4.5x3.1 (in/yd) Discharge Instruction: Apply Kerlix and Coban compression as directed. 80M ACE Elastic Bandage With VELCRO Brand Closure, 4 (in) Compression Stockings Add-Ons Electronic Signature(s) Signed: 01/05/2022 5:47:46 PM By: [name] RN, BSN Entered By: [name] on 01/05/2022 08:33:10 -------------------------------------------------------------------------------- Vitals Details Patient Name: Date of Service: [name], [name] 01/05/2022 8:15 A M Medical Record Number: [id number] Patient Account Number: [id number] Date of Birth/Sex: Treating RN: 08/14/1985 (36 y.o. [name] Primary Care Drezden Seitzinger: Other Clinician: [name] Referring Darcella Shiffman: Treating Tryson Lumley/Extender: [name] in Treatment: 114 Vital Signs Time Taken: 08:15 Temperature (F): 98.0 Height (in): 77 Pulse (bpm):  87 Weight (lbs): 280 Respiratory Rate (breaths/min): 18 Body Mass Index (BMI): 33.2 Blood Pressure (mmHg): 147/95 Reference Range: 80 - 120 mg / dl Electronic Signature(s) Signed: 01/05/2022 5:47:46 PM By: [name] RN, BSN Entered By: [name] on 01/05/2022 91:66:06

## 2022-01-05 NOTE — Progress Notes (Signed)
[name] ([id number]) Visit Report for 01/05/2022 Chief Complaint Document Details Patient Name: Date of Service: [name] D 01/05/2022 8:15 A M Medical Record Number: [id number] Patient Account Number: [id number] Date of Birth/Sex: Treating RN: November 02, 1985 (36 y.o. M) Primary Care Provider: [name] Other Clinician: Referring Provider: Treating Provider/Extender: [name] in Treatment: [place] from: Patient Chief Complaint 01/11/2019; patient is here for review of a rather substantial wound over the left fifth plantar metatarsal head extending into the lateral part of his foot 10/24/2019; patient returns to clinic with wounds on his bilateral feet with underlying osteomyelitis biopsy-proven Electronic Signature(s) Signed: 01/05/2022 8:40:00 AM By: [name] MD FACS Entered By: [name] on 01/05/2022 08:40:00 -------------------------------------------------------------------------------- Debridement Details Patient Name: Date of Service: [name], [name] 01/05/2022 8:15 A M Medical Record Number: [id number] Patient Account Number: [id number] Date of Birth/Sex: Treating RN: [birth year] (36 y.o. [name] Primary Care Provider: [name] Other Clinician: Referring Provider: Treating Provider/Extender: [name] in Treatment: 114 Debridement Performed for Assessment: Wound #8 Right,Lateral Foot Performed By: Physician [name], MD Debridement Type: Debridement Severity of Tissue Pre Debridement: Fat layer exposed Level of Consciousness (Pre-procedure): Awake and Alert Pre-procedure Verification/Time Out Yes - 08:36 Taken: Start Time: 08:36 T Area Debrided (L x W): otal 1.3 (cm) x 1.9 (cm) = 2.47 (cm) Tissue and other material debrided: Non-Viable, Callus, Slough, Slough Level: Non-Viable Tissue Debridement Description: Selective/Open Wound Instrument:  Curette Bleeding: Minimum Hemostasis Achieved: Pressure Procedural Pain: 0 Post Procedural Pain: 0 Response to Treatment: Procedure was tolerated well Level of Consciousness (Post- Awake and Alert procedure): Post Debridement Measurements of Total Wound Length: (cm) 1.3 Width: (cm) 1.9 Depth: (cm) 0.1 Volume: (cm) 0.194 Character of Wound/Ulcer Post Debridement: Improved Severity of Tissue Post Debridement: Fat layer exposed Post Procedure Diagnosis Same as Pre-procedure Electronic Signature(s) Signed: 01/05/2022 8:49:46 AM By: [name] MD FACS Signed: 01/05/2022 5:47:46 PM By: [name] RN, BSN Entered By: [name] on 01/05/2022 08:38:39 -------------------------------------------------------------------------------- Debridement Details Patient Name: Date of Service: [name], [name] 01/05/2022 8:15 A M Medical Record Number: [id number] Patient Account Number: [id number] Date of Birth/Sex: Treating RN: 1985/05/13 (36 y.o. [name] Primary Care Provider: [name] Other Clinician: Referring Provider: Treating Provider/Extender: [name] in Treatment: 114 Debridement Performed for Assessment: Wound #3 Left,Lateral,Plantar Foot Performed By: Physician [name], MD Debridement Type: Debridement Severity of Tissue Pre Debridement: Fat layer exposed Level of Consciousness (Pre-procedure): Awake and Alert Pre-procedure Verification/Time Out Yes - 08:36 Taken: Start Time: 08:36 T Area Debrided (L x W): otal 3 (cm) x 3.2 (cm) = 9.6 (cm) Tissue and other material debrided: Non-Viable, Callus, Biofilm Level: Non-Viable Tissue Debridement Description: Selective/Open Wound Instrument: Curette Bleeding: Minimum Hemostasis Achieved: Pressure Procedural Pain: 0 Post Procedural Pain: 0 Response to Treatment: Procedure was tolerated well Level of Consciousness (Post- Awake and Alert procedure): Post  Debridement Measurements of Total Wound Length: (cm) 3 Width: (cm) 3.2 Depth: (cm) 0.2 Volume: (cm) 1.508 Character of Wound/Ulcer Post Debridement: Improved Severity of Tissue Post Debridement: Fat layer exposed Post Procedure Diagnosis Same as Pre-procedure Electronic Signature(s) Signed: 01/05/2022 8:49:46 AM By: [name] MD FACS Signed: 01/05/2022 5:47:46 PM By: [name] RN, BSN Entered By: [name] on 01/05/2022 08:39:36 -------------------------------------------------------------------------------- HPI Details Patient Name: Date of Service: [name], [name] 01/05/2022 8:15 A M Medical Record Number: [id number] Patient Account Number: [id number] Date of Birth/Sex: Treating  RN: 22-Apr-1985 (36 y.o. 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 He comes in for review of a wound over the plantar fifth metatarsal head extending into the lateral part of the foot. He was followed for this previously by his podiatrist Dr. Cornelius [name]. As the patient tells his story he went to see podiatry first for a swelling he developed on the lateral part of his fifth metatarsal head in May. He states this was "open" by podiatry and the area closed. He was followed up in June and it was again opened callus removed and it closed promptly. There were plans being made for surgery on the fifth metatarsal head in June however his blood sugar was apparently too high for anesthesia. Apparently the area was debrided and opened again in June and it is never closed since. Looking over the records from podiatry I am really not able to follow this. It was clear when he was first seen it was before 5/14 at that point he already had  a wound. By 5/17 the ulcer was resolved. I do not see anything about a procedure. On 5/28 noted to have pre-ulcerative moderate keratosis. X-ray noted 1/5 contracted toe and tailor's bunion and metatarsal deformity. On a visit date on 09/28/2018 the dorsal part of the left foot it healed and resolved. There was concern about swelling in his lower extremity he was sent to the ER.. As far as I can tell he was seen in the ER on 7/12 with an ulcer on his left foot. A DVT rule out of the left leg was negative. I do not think I have complete records from podiatry but I am not able to verify the procedures this patient states he had. He states after the last procedure the wound has never closed although I am not able to follow this in the records I have from podiatry. He has not had a recent x-ray The patient has been using Neosporin on the wound. He is wearing a Darco shoe. He is still very active up on his foot working and exercising. Past medical history; type 2 diabetes ketosis-prone, leg swelling with a negative DVT study in July. Non-smoker ABI in our clinic was 0.85 on the left 10/16; substantial wound on the plantar left fifth met head extending laterally almost to the dorsal fifth MTP. We have been using silver alginate we gave him a Darco forefoot off loader. An x-ray did not show evidence of osteomyelitis did note soft tissue emphysema which I think was due to gas tracking through an open wound. There is no doubt in my mind he requires an MRI 10/23; MRI not booked until 3 November at the earliest this is largely due to his glucose sensor in the right arm. We have been using silver alginate. There has been an improvement 10/29; I am still not exactly sure when his MRI is booked for. He says it is the third but it is the 10th in epic. This definitely needs to be done. He is running a low-grade fever today but no other symptoms. No real improvement in the 1 02/26/2019 patient presents today for a  follow-up visit here in our clinic he is last been seen in the clinic on October 29. Subsequently we were working on getting MRI to evaluate  and see what exactly was going on and where we would need to go from the standpoint of whether or not he had osteomyelitis and again what treatments were going be required. Subsequently the patient ended up being admitted to the hospital on 02/07/2019 and was discharged on 02/14/2019. This is a somewhat interesting admission with a discharge diagnosis of pneumonia due to COVID-19 although he was positive for COVID-19 when tested at the urgent care but negative x2 when he was actually in the hospital. With that being said he did have acute respiratory failure with hypoxia and it was noted he also have a left foot ulceration with osteomyelitis. With that being said he did require oxygen for his pneumonia and I level 4 L. He was placed on antivirals and steroids for the COVID-19. He was also transferred to the [facility] at one point. Nonetheless he did subsequently discharged home and since being home has done much better in that regard. The CT angiogram did not show any pulmonary embolism. With regard to the osteomyelitis the patient was placed on vancomycin and Zosyn while in the hospital but has been changed to Augmentin at discharge. It was also recommended that he follow- up with wound care and podiatry. Podiatry however wanted him to see [name] according to the patient prior to them doing anything further. His hemoglobin A1c was 9.9 as noted in the hospital. Have an MRI of the left foot performed while in the hospital on 02/04/2019. 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 There is an overlying plantar open wound noted an abscess tracking back along the lateral aspect of the fifth metatarsal shaft. There is otherwise diffuse cellulitis and mild fasciitis without  findings of polymyositis. The patient did have recently pneumonia secondary to COVID-19 I looked in the chart through epic and it does appear that the patient may need to have an additional x-ray just to ensure everything is cleared and that he has no airspace disease prior to putting him into the [name]. 03/05/2019; patient was readmitted to the clinic last week. He was hospitalized twice for a viral upper respiratory tract infection from 11/1 through 11/4 and then 11/5 through 11/12 ultimately this turned out to be Covid pneumonitis. Although he was discharged on oxygen he is not using it. He says he feels fine. He has no exercise limitation no cough no sputum. His O2 sat in our clinic today was 100% on room air. 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 He received Vanco and Zosyn in the hospital and then was discharged on 2 weeks of Augmentin. I do not see any relevant cultures. He was supposed to follow-up with infectious disease but I do not see that he has an appointment. 12/8; patient saw Dr. Novella Olive of infectious disease last week. He felt that he had had adequate antibiotic therapy. He did not go to follow-up with Dr. Amalia Hailey of podiatry and I have again talked to him about the pros and cons of this. He does not want to consider a ray amputation of this time. He is aware of the risks of recurrence, migration etc. He started HBO today and tolerated this well. He can complete the Augmentin that I gave him last week. I have looked over the lab work that Dr. Chana Bode ordered his C-reactive protein  was 3.3 and his sedimentation rate was 17. The C-reactive protein is never really been measurably that high in this patient 12/15; not much change in the wound today however he has undermining along the lateral part of the foot again more extensively than last week. He has some rims of  epithelialization. We have been using silver alginate. He is undergoing hyperbarics but did not dive today 12/18; in for his obligatory first total contact cast change. Unfortunately there was pus coming from the undermining area around his fifth metatarsal head. This was cultured but will preclude reapplication of a cast. He is seen in conjunction with HBO 12/24; patient had staph lugdunensis in the wound in the undermining area laterally last time. We put him on doxycycline which should have covered this. The wound looks better today. I am going to give him another week of doxycycline before reattempting the total contact cast 12/31; the patient is completing antibiotics. Hemorrhagic debris in the distal part of the wound with some undermining distally. He also had hyper granulation. Extensive debridement with a #5 curette. The infected area that was on the lateral part of the fifth met head is closed over. I do not think he needs any more antibiotics. Patient was seen prior to HBO. Preparations for a total contact cast were made in the cast will be placed post hyperbarics 04/11/19; once again the patient arrives today without complaint. He had been in a cast all week noted that he had heavy drainage this week. This resulted in large raised areas of macerated tissue around the wound 1/14; wound bed looks better slightly smaller. Hydrofera Blue has been changing himself. He had a heavy drainage last week which caused a lot of maceration around the wound so I took him out of a total contact cast he says the drainage is actually better this week He is seen today in conjunction with HBO 1/21; returns to clinic. He was up in [place] for a day or 2 attending a funeral. He comes back in with the wound larger and with a large area of exposed bone. He had osteomyelitis and septic arthritis of the fifth left metatarsal head while he was in hospital. He received IV antibiotics in the hospital for a  prolonged period of time then 3 weeks of Augmentin. Subsequently I gave him 2 weeks of doxycycline for more superficial wound infection. When I saw this last week the wound was smaller the surface of the wound looks satisfactory. 1/28; patient missed hyperbarics today. Bone biopsy I did last time showed Enterococcus faecalis and Staphylococcus lugdunensis . He has a wide area of exposed bone. We are going to use silver alginate as of today. I had another ethical discussion with the patient. 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 In this situation I think the likelihood of healing this is low. Therefore I have recommended a ray amputation and with the patient's agreement I have referred him to Dr. Doran Durand. The other issue is that his compliance with hyperbarics has been minimal because of his work schedule and given his underlying decision I am going to stop this today READMISSION 10/24/2019 MRI 09/29/2019 left foot IMPRESSION: 1. Apparent skin ulceration inferior and lateral to the 5th metatarsal base with underlying heterogeneous T2 signal and enhancement in the subcutaneous fat. Small peripherally enhancing fluid collections along the plantar and lateral aspects of the 5th metatarsal base suspicious for abscesses. 2. Interval amputation through the mid 5th metatarsal with nonspecific low-level  marrow edema and enhancement. Given the proximity to the adjacent soft tissue inflammatory changes, osteomyelitis cannot be excluded. 3. The additional bones appear unremarkable. MRI 09/29/2019 right foot IMPRESSION: 1. Soft tissue ulceration lateral to the 5th MTP joint. There is low-level T2 hyperintensity within the 4th and 5th metatarsal heads and adjacent proximal phalanges without abnormal T1 signal or cortical destruction. These findings are nonspecific and could be seen with early marrow edema, hyperemia or early osteomyelitis. No evidence of septic  joint. 2. Mild tenosynovitis and synovial enhancement associated with the extensor digitorum tendons at the level of the midfoot. 3. Diffuse low-level muscular T2 hyperintensity and enhancement, most consistent with diabetic myopathy. LEFT FOOT BONE Methicillin resistant staphylococcus aureus Staphylococcus lugdunensis MIC MIC CIPROFLOXACIN >=8 RESISTANT Resistant <=0.5 SENSI... Sensitive CLINDAMYCIN <=0.25 SENS... Sensitive >=8 RESISTANT Resistant ERYTHROMYCIN >=8 RESISTANT Resistant >=8 RESISTANT Resistant GENTAMICIN <=0.5 SENSI... Sensitive <=0.5 SENSI... Sensitive Inducible Clindamycin NEGATIVE Sensitive NEGATIVE Sensitive OXACILLIN >=4 RESISTANT Resistant 2 SENSITIVE Sensitive RIFAMPIN <=0.5 SENSI... Sensitive <=0.5 SENSI... Sensitive TETRACYCLINE <=1 SENSITIVE Sensitive <=1 SENSITIVE Sensitive TRIMETH/SULFA <=10 SENSIT Sensitive <=10 SENSIT Sensitive ... Marland Kitchen.. VANCOMYCIN 1 SENSITIVE Sensitive <=0.5 SENSI... Sensitive Right foot bone . Component 3 wk ago Specimen Description BONE Special Requests RIGHT 4 METATARSAL SAMPLE B Gram Stain NO WBC SEEN NO ORGANISMS SEEN Culture RARE METHICILLIN RESISTANT STAPHYLOCOCCUS AUREUS NO ANAEROBES ISOLATED Performed at [facility], [place] [address], [place], [place] [address] Report Status 10/08/2019 FINAL Organism ID, Bacteria METHICILLIN RESISTANT STAPHYLOCOCCUS AUREUS Resulting Agency [facility] Susceptibility Methicillin resistant staphylococcus aureus MIC CIPROFLOXACIN >=8 RESISTANT Resistant CLINDAMYCIN <=0.25 SENS... Sensitive ERYTHROMYCIN >=8 RESISTANT Resistant GENTAMICIN <=0.5 SENSI... Sensitive Inducible Clindamycin NEGATIVE Sensitive OXACILLIN >=4 RESISTANT Resistant RIFAMPIN <=0.5 SENSI... Sensitive TETRACYCLINE <=1 SENSITIVE Sensitive TRIMETH/SULFA <=10 SENSIT Sensitive ... VANCOMYCIN 1 SENSITIVE Sensitive This is a patient we had in clinic earlier this year with a wound over his left fifth metatarsal head. 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 Doran Durand on 05/16/2019. He seems to have developed problems with wounds on his bilateral feet in June 2021 although he may have had problems earlier than this. He was in an urgent care with a right foot ulcer on 09/26/2019 and given a course of doxycycline. This was apparently after having trouble getting into see orthopedics. He was seen by podiatry on 09/28/2019 noted to have bilateral lower extremity ulcers including the left lateral fifth metatarsal base and the right subfifth met head. It was noted that had purulent drainage at that time. He required hospitalization from 6/20 through 7/2. This was because of worsening right foot wounds. He underwent bilateral operative incision and drainage and bone biopsies bilaterally. Culture results are listed above. He has been referred back to clinic by Dr. Jacqualyn Posey of podiatry. He is also followed by Dr. Megan Salon who saw him yesterday. He was discharged from hospital on Zyvox Flagyl and Levaquin and yesterday changed to doxycycline Flagyl and Levaquin. His inflammatory markers on 6/26 showed a sedimentation rate of 129 and a C-reactive protein of 5. This is improved to 14 and 1.3 respectively. This would indicate improvement. ABIs in our clinic today were 1.23 on the right and 1.20 on the left 11/01/2019 on evaluation today patient appears to be doing fairly well  in regard to the wounds on his feet at this point. Fortunately there is no signs of active infection at this time. No fevers, chills, nausea, vomiting, or diarrhea. He currently is seeing infectious disease and still under their care at this point. Subsequently he also has both wounds  which she has not been using collagen on as he did not receive that in his packaging he did not call us and let us know that. Apparently that just was missed on the order. Nonetheless we will get that straightened out today. 8/9-Patient returns for bilateral foot wounds, using Prisma with hydrogel moistened dressings, and the wounds appear stable. Patient using surgical shoes, avoiding much pressure or weightbearing as much as possible 8/16; patient has bilateral foot wounds. 1 on the right lateral foot proximally the other is on the left mid lateral foot. Both required debridement of callus and thick skin around the wounds. We have been using silver collagen 8/27; patient has bilateral lateral foot wounds. The area on the left substantially surrounded by callus and dry skin. This was removed from the wound edge. The underlying wound is small. The area on the right measured somewhat smaller today. 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 Unfortunately I did not update his antibiotics during today's visit. 9/10 I reviewed Dr. Hale Bogus last notes he felt he had completed antibiotics his inflammatory markers were reasonably well controlled. He has a small wound on the lateral left foot and a tiny area on the right which is just above closed. He is using Hydrofera Blue with border foam he has bilateral surgical shoes 9/24; 2 week f/u. doing well. right foot is closed. left foot still undermined. 10/14; right foot remains closed at the fifth met head. The area over the base of the left fifth metatarsal has a small open area but considerable undermining towards the plantar foot. Thick callus skin around this suggests an adequate pressure relief. We have talked about this. He says he is going to go back into his cam boot. I suggested a total contact cast he did not seem enamored with this suggestion 10/26; left foot base of the fifth  metatarsal. Same condition as last time. He has skin over the area with an open wound however the skin is not adherent. He went to see Dr. Earleen Newport who did an x-ray and culture of his foot I have not reviewed the x-ray but the patient was not told anything. He is on doxycycline 11/11; since the patient was last here he was in the emergency room on 10/30 he was concerned about swelling in the left foot. They did not do any cultures or x-rays. They changed his antibiotics to cephalexin. Previous culture showed group B strep. The cephalexin is appropriate as doxycycline has less than predictable coverage. Arrives in clinic today with swelling over this area under the wound. He also has a new wound on the right fifth metatarsal head 11/18; the patient has a difficult wound on the lateral aspect of the left fifth metatarsal head. The wound was almost ballotable last week I opened it slightly expecting to see purulence however there was just bleeding. I cultured this this was negative. X-ray unchanged. We are trying to get an MRI but I am not sure were going to be able to get this through his insurance. He also has an area on the right lateral fifth metatarsal head this looks healthier 12/3; the patient finally got  our MRI. Surprisingly this did not show osteomyelitis. I did show the soft tissue ulceration at the lateral plantar aspect of the fifth metatarsal base with a tiny residual 6 mm abscess overlying the superficial fascia I have tried to culture this area I have not been able to get this to grow anything. Nevertheless the protruding tissue looks aggravated. I suspect we should try to treat the underlying "abscess with broad-spectrum antibiotics. I am going to start him on Levaquin and Flagyl. He has much less edema in his legs and I am going to continue to wrap his legs and see him weekly 12/10. I started Levaquin and Flagyl on him last week. He just picked up the Flagyl apparently there was some delay.  The worry is the wound on the left fifth metatarsal base which is substantial and worsening. His foot looks like he inverts at the ankle making this a weightbearing surface. Certainly no improvement in fact I think the measurements of this are somewhat worse. We have been using 12/17; he apparently just got the Levaquin yesterday this is 2 weeks after the fact. He has completed the Flagyl. The area over the left fifth metatarsal base still has protruding granulation tissue although it does not look quite as bad as it did some weeks ago. He has severe bilateral lymphedema although we have not been treating him for wounds on his legs this is definitely going to require compression. There was so much edema in the left I did not wish to put him in a total contact cast today. I am going to increase his compression from 3-4 layer. The area on the right lateral fifth met head actually look quite good and superficial. 12/23; patient arrived with callus on the right fifth met head and the substantial hyper granulated callused wound on the base of his fifth metatarsal. He says he is completing his Levaquin in 2 days but I do not think that adds up with what I gave him but I will have to double check this. We are using Hydrofera Blue on both areas. My plan is to put the left leg in a cast the week after New Year's 04/06/2020; patient's wounds about the same. Right lateral fifth metatarsal head and left lateral foot over the base of the fifth metatarsal. There is undermining on the left lateral foot which I removed before application of total contact cast continuing with Hydrofera Blue new. Patient tells me he was seen by endocrinology today lab work was done [Dr. Kerr]. Also wondering whether he was referred to cardiology. I went over some lab work from previously does not have chronic renal failure certainly not nephrotic range proteinuria he does have very poorly controlled diabetes but this is not his most  updated lab work. Hemoglobin A1c has been over 11 1/10; the patient had a considerable amount of leakage towards mid part of his left foot with macerated skin however the wound surface looks better the area on the right lateral fifth met head is better as well. I am going to change the dressing on the left foot under the total contact cast to silver alginate, continue with Hydrofera Blue on the right. 1/20; patient was in the total contact cast for 10 days. Considerable amount of drainage although the skin around the wound does not look too bad on the left foot. The area on the right fifth metatarsal head is closed. Our nursing staff reports large amount of drainage out of the left lateral foot wound  1/25; continues with copious amounts of drainage described by our intake staff. PCR culture I did last week showed E. coli and Enterococcus faecalis and low quantities. Multiple resistance genes documented including extended spectrum beta lactamase, MRSA, MRSE, quinolone, tetracycline. The wound is not quite as good this week as it was 5 days ago but about the same size 2/3; continues with copious amounts of malodorous drainage per our intake nurse. The PCR culture I did 2 weeks ago showed E. coli and low quantities of Enterococcus. There were multiple resistance genes detected. I put Neosporin on him last week although this does not seem to have helped. The wound is slightly deeper today. Offloading continues to be an issue here although with the amount of drainage she has a total contact cast is just not going to work 2/10; moderate amount of drainage. Patient reports he cannot get his stocking on over the dressing. I told him we have to do that the nurse gave him suggestions on how to make this work. The wound is on the bottom and lateral part of his left foot. Is cultured predominantly grew low amounts of Enterococcus, E. coli and anaerobes. There were multiple resistance genes detected including  extended spectrum beta lactamase, quinolone, tetracycline. I could not think of an easy oral combination to address this so for now I am going to do topical antibiotics provided by [facility] I think the main agents here are vancomycin and an aminoglycoside. We have to be able to give him access to the wounds to get the topical antibiotic on 2/17; moderate amount of drainage this is unchanged. He has his Keystone topical antibiotic against the deep tissue culture organisms. He has been using this and changing the dressing daily. Silver alginate on the wound surface. 2/24; using Keystone antibiotic with silver alginate on the top. He had too much drainage for a total contact cast at one point although I think that is improving and I think in the next week or 2 it might be possible to replace a total contact cast I did not do this today. In general the wound surface looks healthy however he continues to have thick rims of skin and subcutaneous tissue around the wide area of the circumference which I debrided 06/04/2020 upon evaluation today patient appears to be doing well in regard to his wound. I do feel like he is showing signs of improvement. There is little bit of callus and dead tissue around the edges of the wound as well as what appears to be a little bit of a sinus tract that is off to the side laterally I would perform debridement to clear that away today. 3/17; left lateral foot. The wound looks about the same as I remember. Not much depth surface looks healthy. No evidence of infection 3/25; left lateral foot. Wound surface looks about the same. Separating epithelium from the circumference. There really is no evidence of infection here however not making progress by my view 3/29; left lateral foot. Surface of the wound again looks reasonably healthy still thick skin and subcutaneous tissue around the wound margins. There is no evidence of infection. One of the concerns being brought up by the  nurses has again the amount of drainage vis--vis continued use of a total contact cast 4/5; left lateral foot at roughly the base of the fifth metatarsal. Nice healthy looking granulated tissue with rims of epithelialization. The overall wound measurements are not any better but the tissue looks healthy. The only concern  is the amount of drainage although he has no surrounding maceration with what we have been doing recently to absorb fluid and protect his skin. He also has lymphedema. 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 He states that the kerlix/coban under the TCC rolled down and could not get it back up. He has been using an offloading scooter and has somehow hurt his right foot using this device. This happened last week. He states that the side of his right foot developed a blister and opened. The top of his foot also has a few small open wounds he thinks is due to his socks rubbing in his shoes. He has not been using any dressings to the wound. He denies purulent drainage, fever/chills or erythema to the wounds. 4/22; patient presents for 1 week follow-up. He developed new wounds to the right foot that were evaluated at last clinic visit. He continues to have a total contact cast to the left leg and he reports no issues. He has been using silver collagen to the right foot wounds with no issues. He denies purulent drainage, fever/chills or erythema to the right foot wounds. He has no complaints today 4/25; patient presents for 1 week follow-up. He has a total contact cast of the left leg and reports no issues. He has been using silver alginate to the right foot wound. He denies purulent drainage, fever/chills or erythema to the right foot  wounds. 5/2 patient presents for 1 week follow-up. T contact cast on the left. The wound which is on the base of the plantar foot at the base of the fifth metatarsal otal actually looks quite good and dimensions continue to gradually contract. HOWEVER the area on the right lateral fifth metatarsal head is much larger than what I remember from 2 weeks ago. Once more is he has significant levels of hypergranulation. Noteworthy that he had this same hyper granulated response on his wound on the left foot at one point in time. So much so that he I thought there was an underlying fluid collection. Based on this I think this just needs debridement. 5/9; the wound on the left actually continues to be gradually smaller with a healthy surface. Slight amount of drainage and maceration of the skin around but not too bad. However he has a large wound over the right fifth metatarsal head very much in the same configuration as his left foot wound was initially. I used silver nitrate to address the hyper granulated tissue no mechanical debridement 5/16; area on the left foot did not look as healthy this week deeper thick surrounding macerated skin and subcutaneous tissue. The area on the right foot fifth met head was about the same The area on the right ankle that we identified last week is completely broken down into an open wound presumably a stocking rubbing issue 5/23; patient has been using a total contact cast to the left side. He has been using silver alginate underneath. He has also been using silver alginate to the right foot wounds. He has no complaints today. He denies any signs of infection. 5/31; the left-sided wound looks some better measure smaller surface granulation looks better. We have been using silver alginate under the total contact cast  The large area on his right fifth met head and right dorsal foot look about the same still using silver alginate 6/6; neither side is good as I was hoping  although the surface area dimensions are better. A lot of maceration on his left and right foot around the wound edge. Area on the dorsal right foot looks better. He says he was traveling. I am not sure what does the amount of maceration around the plantar wounds may be drainage issues 6/13; in general the wound surfaces look quite good on both sides. Macerated skin and raised edges around the wound required debridement although in general especially on the left the surface area seems improved. The area on the right dorsal ankle is about the same I thought this would not be such a problem to close 6/20; not much change in either wound although the one on the right looks a little better. Both wounds have thick macerated edges to the skin requiring debridements. We have been using silver alginate. The area on the dorsal right ankle is still open I thought this would be closed. 6/28; patient comes in today with a marked deterioration in the right foot wound fifth met head. Wide area of exposed bone this is a drastic change from last time. The area on the left there we have been casting is stagnant. We have been using silver alginate in both wound areas. 7/5; bone culture I did for PCR last time was positive for Pseudomonas, group B strep, Enterococcus and Staph aureus. There was no suggestion of methicillin resistance or ampicillin resistant genes. This was resistant to tetracycline however He comes into the clinic today with the area over his right plantar fifth metatarsal head which had been doing so well 2 weeks ago completely necrotic feeling bone. I do not know that this is going to be salvageable. The left foot wound is certainly no smaller but it has a better surface and is superficial. 7/8; patient called in this morning to say that his total contact cast was rubbing against his foot. He states he is doing fine overall. He denies signs of infection. 7/12; continued deterioration in the wound  over the right fifth metatarsal head crumbling bone. This is not going to be salvageable. The patient agrees and wants to be referred to Dr. Doran Durand which we will attempt to arrange as soon as possible. I am going to continue him on antibiotics as long as that takes so I will renew those today. The area on the left foot which is the base of the fifth metatarsal continues to look somewhat better. Healthy looking tissue no depth no debridement is necessary here. 7/20; the patient was kindly seen by Dr. Doran Durand of orthopedics on 10/19/2020. 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 Towards this end we have taken him out of the total contact cast on the left we will put him in a wrap with Hydrofera Blue. As I understand things surgery is planned for 7/21 7/27; patient had his surgery last Thursday. 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 Apparently everything went well we did not still disturb that today The area on the left foot actually looks quite good. He has been much less mobile which probably explains this he did not seem to do well in the total contact cast secondary to drainage and maceration I think. We have been using  Hydrofera Blue 11/09/2020 upon evaluation today patient appears to be doing well with regard to his plantar foot ulcer on the left foot. Fortunately there is no evidence of active infection at this time. No fevers, chills, nausea, vomiting, or diarrhea. Overall I think that he is actually doing extremely well. Nonetheless I do believe that he is staying off of this more following the surgery in his right foot that is the reason the left is doing so great. 8/16; left plantar foot wound. This looks smaller than the last time I saw this he is using Hydrofera Blue. The surgical wound on the right foot is being followed by Dr. Doran Durand we did not look at this today. He has surgical shoes on both  feet 8/23; left plantar foot wound not as good this week. Surrounding macerated skin and subcutaneous tissue everything looks moist and wet. I do not think he is offloading this adequately. He is using a surgical shoe Apparently the right foot surgical wound is not open although I did not check his foot 8/31; left plantar foot lateral aspect. Much improved this week. He has no maceration. Some improvement in the surface area of the wound but most impressively the depth is come in we are using silver alginate. The patient is a Product/process development scientist. He is asked that we write him a letter so he can go back to work. I have also tried to see if we can write something that will allow him to limit the amount of time that he is on his foot at work. 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 Hopefully the latter can be avoided 9/6; I believe the patient missed an appointment last week. He arrives in today with a wound looking roughly the same certainly no better. Undermining laterally and also inferiorly. We used molecuLight today in training with the patient's permission.. We are using silver alginate 9/21 wound is measuring bigger this week although this may have to do with the aggressive circumferential debridement last week in response to the blush fluorescence on the MolecuLight. Culture I did last week showed significant MSSA and E. coli. I put him on Augmentin but he has not started it yet. We are also going to send this for compounded antibiotics at [place]. There is no evidence of systemic infection 9/29; silver alginate. His Keystone arrived. He is completing Augmentin in 2 days. Offloading in a cam boot. Moderate drainage per our intake staff 10/5; using silver alginate. He has been using his [place]. He has completed his Augmentin. Per our intake nurse still a lot of drainage, far too much to consider a total  contact cast. Wound measures about the same. He had the same undermining area that I defined last week from a roughly 11-3. I remove this today 10/12; using silver alginate he is using the [place]. He comes in for a nurse visit hence we are applying [employer] twice a week. Measuring slightly better today and less notable drainage. Extensive debridement of the wound edge last time 10/18; using topical Keystone and silver alginate and a soft cast. Wound measurements about the same. Drainage was through his soft cast. We are changing this twice a week Tuesdays and Friday 10/25; comes in with moderate drainage. Still using Keystone silver alginate and a soft cast. Wound dimensions completely the same.He has a lot of edema in the left leg he has lymphedema. Asking for  [name] to consider wrapping him as he cannot get his stocking on over the soft cast 11/2; comes in with moderate to large drainage slightly smaller in terms of width we have been using [place]. His wound looks satisfactory but not much improvement 11/4; patient presents today for obligatory cast change. Has no issues or complaints today. He denies signs of infection. 11/9; patient traveled this weekend to [place], was on the cast quite a bit. Staining of the cast with black material from his walking boot. Drainage was not quite as bad as we feared. Using silver alginate and Keystone 11/16; we do not have size for cast therefore we have been putting a soft cast on him since the change on Friday. Still a significant amount of drainage necessitating changing twice a week. We have been using the Keystone at cast changes either hard or soft as well as silver alginate Comes in the clinic with things actually looking fairly good improvement in width. He says his offloading is about the same 02/24/2021 upon evaluation today patient actually comes back in and is doing excellent in regard to his foot ulcer this is significantly smaller even compared to the  last visit. The soft cast seems to have done extremely well for him which is great news. I do not see any signs of infection minimal debridement will be needed today. 11/30; left lateral foot much improved half a centimeter improvement in surface area. No evidence of infection. He seems to be doing better with the soft cast in the TCC therefore we will continue with this. He comes back in later in the week for a change with the nurses. This is due to drainage 12/6; no improvement in dimensions. Under illumination some debris on the surface we have been using silver alginate, soft cast. If there is anything optimistic here he seems to have have less drainage 12/13. Dimensions are improved both length and width and slightly in depth. Appears to be quite healthy today. Raised edges of this thick skin and callus around the edges however. He is in a soft cast were bringing him back once for a change on Friday. Drainage is better 12/20. Dimensions are improved. He still has raised edges of thick skin and callus around the edges. We are using a soft cast 12/28; comes in today with thick callus around the wound. Using silver under alginate under a soft cast. I do not think there is much improvement in any measurement 2023 04/06/2021; patient was put in a total contact cast. Unfortunately not much change in surface area 1/10; not much different still thick callus and skin around the edge in spite of the total contact cast. This was just debrided last week we have been using the [facility] compounded antibiotic and silver alginate under a total contact cast 1/18 the patient's wound on the left side is doing nicely. smaller HOWEVER he comes in today with a wound on the right foot laterally. blister most likely serosangquenous drainage 1/24; the patient continues to do well in terms of the plantar left foot which is continued to contract using silver alginate under the total contact cast HOWEVER the right lateral  foot is bigger with denuded skin around the edges. I used pickups and a #15 scalpel to remove this this looks like the remanence of a large blister. Cannot rule out infection. Culture in this area I did last week showed Staphylococcus lugdunensis few colonies. I am going to try to address this with his [employer] antibiotic that  is done so well on the left having linezolid and this should cover the staph 2/1; the patient's wound on his left foot which was the original plantar foot wound thick skin and eschar around the edges even in the total contact cast but the wound surface does not look too bad The real problem is on how his right lateral foot at roughly the base of the fifth metatarsal. The wound is completely necrotic more worrisome than that there is swelling around the edges of this. We have been using silver alginate on both wounds and Keystone on the right foot. Unfortunately I think he is going to require systemic antibiotics while we await cultures. He did not get the x-ray done that we ordered last week [lost the prescription 2/7; disappointingly in the area on the left foot which we are treating with a total contact cast is still not closed although it is much smaller. He continues to have a lot of callus around the wound edge. -Right lateral foot culture I did last week was negative x-ray also negative for osteomyelitis. 2/15: TCC silver alginate on the left and silver alginate on the right lateral. No real improvement in either area 05/26/2021: T oday, the wounds are roughly the same size as at his previous visit, post-debridement. He continues to endorse fairly substantial drainage, particularly on the right. He has been in a total contact cast on the left. There is still some callus surrounding this lesion. On the right, the periwound skin is quite macerated, along with surrounding callus. The center of the right-sided wound also has some dark, densely adherent material, which is very  difficult to remove. 06/02/2021: Today, both wounds are slightly smaller. He has been using zinc oxide ointment around the right ulcer and the degree of maceration has improved markedly. There continues to be an area of nonviable tissue in the center of the right sided ulcer. The left-sided wound, which has been in the total contact cast. Appears clean and the degree of callus around it is less than previously. 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 He had to take the stairs and both wounds have increased in size. The left foot, which has been in a total contact cast, has developed a tunnel tracking to the lateral aspect of his foot. The nonviable tissue in the center of the right-sided ulcer remains recalcitrant to debridement. There is significant undermining surrounding the entirety of the left sided wound. 06/16/2021: The elevator at school has been fixed and the patient has been able to avoid putting as much weight on his wounds over the past week. We converted the left foot wound into a single lesion today, but despite this, the wound is actually smaller. The base is healthy with limited periwound callus. On the right, the central necrotic area is still present. He continues to be quite macerated around the right sided wound, despite applying barrier cream. This does, however, have the benefit of softening the callus to make it more easily removable. 06/23/2021: Today, the left wound is smaller. The lateral aspect that had opened up previously is now closed. The wound base has a healthy bed of granulation tissue and minimal slough. Unfortunately, on the right, the wound is larger and continues to be fairly macerated. He has also reopened the wound at his right ankle. He thinks this is due to the gait he has adopted secondary to his total contact cast and boot. 06/30/2021:  T oday, both wounds are a little bit larger. The lateral  aspect on the left has remained closed. He continues to have significant periwound maceration. The culture that I took from the right sided wound grew a population of bacteria that is not covered by his current  [facility] antibiotic. The center of the right- sided wound continues to appear necrotic with nonviable fat. It probes deeper today, but does not reach bone. 07/07/2021: The periwound maceration is a little bit less today. The right lateral foot wound has some areas that appear more viable and the necrotic center also looks a little bit better. The wound on the dorsal surface of his right foot near the ankle is contracting and the surface appears healthy. The left plantar wound surface looks healthy, but there is some new undermining on the medial portion. He did get his new Keystone antibiotic and began applying that to the right foot wound on Saturday. 07/14/2021: The intake nurse reported substantial drainage from his wounds, but the periwound skin actually looks better than is typical for him. The wound on the dorsal surface of his right foot near the ankle is smaller and just has a small open area underneath some dried eschar. The left plantar wound surface looks healthy and there has been no significant accumulation of callus. The right lateral foot wound looks quite a bit better, with the central portion, which typically appears necrotic, looking more viable albeit pale. 07/22/2021: His left foot is extremely macerated today. The wound is about the same size. The wound on the dorsal surface of his right foot near the ankle had closed, but he traumatized it removing the dressing and there is a tiny skin tear in that location. The right lateral foot wound is bigger, but the surface appears healthy. 07/30/2021: The wound on the dorsal surface of his right foot near the ankle is closed. The right lateral foot wound again is a little bit bigger due to some undermining. The periwound skin is in  better condition, however. He has been applying zinc oxide. The wound surface is a little bit dry today. On the left, he does not have the substantial maceration that we frequently see. The wound itself is smaller and has a clean surface. 08/06/2021: Both wounds seem to have deteriorated over the past week. The right lateral foot wound has a dry surface but the periwound is boggy.. Overall wound dimensions are about the same. On the left, the wound is about the same size, but there is more undermining present underneath periwound callus. 08/13/2021: The right sided wound looks about the same, but on the left there has been substantial deterioration. The undermining continues to extend under periwound callus. Once this was removed, substantial extension of the wound was present. There is no odor or purulent drainage but clearly the wounds have broken down. 08/20/2021: The wounds look about the same today. He has been out of his total contact cast and has just been changing the dressings using topical Keystone with PolyMem Ag, Kerlix and Ace bandages. The wound on the top of his right ankle has reopened but this is quite small. There was a little bit of purulent material that I expressed when examining this wound. 08/24/2021: After the aggressive debridement I performed at his last visit, the wounds actually look a little bit better today. They are smaller with the exception of the wound on the top of his right ankle which is a little bit bigger as some more skin pulled off  when he was changing his dressing. We are using topical Keystone with PolyMem Ag Kerlix and Ace bandages. 09/02/2021: There has been really no change to any of his wounds. 09/16/2021: The patient was hospitalized last week with nausea, vomiting, and dehydration. He says that while he was in the hospital, his wounds were not really addressed properly. T oday, both plantar foot wounds are larger and the periwound skin is macerated. The wound  on the dorsum of his right foot has a scab on the top. The right foot now has a crater where previously he had had nonviable fat. It looks as though this simply died and fell out. The periwound callus is wet. 09/24/2021: His wounds have deteriorated somewhat since his last visit. The wound on the dorsum of his right foot near his ankle is larger and has more nonviable tissue present. The crater in his right foot is even deeper; I cannot quite palpate or probe to bone but I am sure it is close. The wound on his left plantar foot has an odd boggy area in the center that almost feels as though it has fluid within it. He has run out of his topical Keystone antibiotic. We are using silver alginate on his wounds. 09/29/2021: He has developed a new wound on the dorsum of his left foot near his ankle. He says he thinks his wrapping is rubbing in that site. I would concur with this as the wound on his right ankle is larger. The left foot looks about the same. The right foot has the crater that was present last week. No significant slough accumulation, but his foot remains quite swollen and warm despite oral antibiotic therapy. 10/08/2021: All of his wounds look about the same as last week. He did not start his oral antibiotics that are prescribed until just a couple of days ago; his [employer] compounded antibiotics formula has been changed and he is awaiting delivery of the new recipe. His MRI that was scheduled for earlier this week was canceled as no prior authorization had been obtained; unfortunately the tech responsible sent an email to my old  email, which I no longer use nor have access to. 10/18/2021: The wounds on his bilateral dorsal feet near the ankles are both improved. They are smaller and have just some eschar and slough buildup. The left plantar wound has a fair amount of undermining, but the surface is clean. There is some periwound callus accumulation. On the right plantar foot, there  is nonviable fat leading to a deep tunnel that tracks towards his dorsal medial foot. There is periwound callus and slough accumulation, as well. His right foot and leg remain swollen as compared to the left. 10/25/2021: The wounds on his bilateral dorsal feet and at the ankles have broken down somewhat. They are little bit larger than last week. The left plantar wound continues to undermine laterally but the surface is clean. The right plantar foot wound shows some decreased depth in the tunnel tracking towards his dorsal medial foot. He has not yet had the Doppler study that I ordered; it sounds like there is some confusion about the scheduling of the procedure. In addition, the MRI was denied and I have taken steps to appeal the denial. 11/24/2021: Since our last visit, Mr. Reha was admitted to the hospital where an MRI suggested osteomyelitis. He was taken the operating room by podiatry. Bone biopsies were negative for osteomyelitis. They debrided his wounds and applied myriad matrix. He saw them  last week and they removed his staples. He is here today to continue his wound healing process. T oday, both of the dorsal foot/ankle wounds are substantially smaller. There is just a little eschar overlying the left sided wound and some eschar and slough on the right. The right plantar foot ulcer has the healthiest surface of granulation tissue that I have seen to date. A portion of the myriad matrix failed to take and was hanging loose. It appears that myriad morcells were placed into the tunnel closest to the dorsal portion of his foot. These have sloughed off. The left plantar foot ulcer is about the same size, but has a much healthier surface than in the past. Both plantar ulcers have callus and slough accumulation. 12/07/2021: Left dorsal foot/ankle wound is closed. The right dorsal foot/ankle wound is nearly closed and just has a small open area with some eschar and slough. The right plantar foot  wound has contracted quite a bit since our last visit. It has a healthy surface with just a little bit of slough accumulation and periwound callus buildup. The left plantar foot wound is about the same size but the surface appears healthy. There is a little slough and periwound callus on this side, as well. 12/15/2021: Both dorsal foot/ankle wounds are closed. The right plantar foot wound is substantially smaller than at our last visit. The tunneling that was present has nearly closed. There is just a little bit of slough buildup. The left plantar foot wound is also a little bit smaller today. The surface is the healthiest that I have ever seen it. Light slough and periwound callus accumulation on this side. 12/22/2021: The dorsal ankle/foot wounds remain closed. The right plantar foot wound continues to contract. There is still a bit of depth at the lateral portion of the wound but the surface has a good granulated appearance. The wound on the left is about the same size, the central indented portion is still adherent. It also is clean with good granulation tissue present. The periwound skin and callus are a bit macerated, however. 12/29/2021: Both ankle wounds remain closed. The right plantar foot wound is less than half the size that it was last week. There is no depth any longer and the surface has just a little bit of slough and periwound callus. On the left, the wound is not much smaller, but the surface is healthier with good granulation tissue. There is also a little slough and periwound callus buildup. 01/05/2022: The right plantar foot wound continues to contract and is once again about half the size as it was last week. There is just a little bit of surface slough and periwound callus. On the left, the depth of the wound has decreased and the diameter has started to contract. It is also very clean with just a little slough and biofilm present. Electronic Signature(s) Signed: 01/05/2022 8:41:41  AM By: [name] MD FACS Entered By: [name] on 01/05/2022 08:41:41 -------------------------------------------------------------------------------- Physical Exam Details Patient Name: Date of Service: [name], [name] 01/05/2022 8:15 A M Medical Record Number: [id number] Patient Account Number: [id number] Date of Birth/Sex: Treating RN: 06-13-1985 (36 y.o. M) Primary Care Provider: [name] Other Clinician: Referring Provider: Treating Provider/Extender: [name] in Treatment: 114 Constitutional Slightly hypertensive. . . . No acute distress.Marland Kitchen Respiratory Normal work of breathing on room air.. Notes 01/05/2022: The right plantar foot wound continues to contract and is once again about half the size as it  was last week. There is just a little bit of surface slough and periwound callus. On the left, the depth of the wound has decreased and the diameter has started to contract. It is also very clean with just a little slough and biofilm present. Electronic Signature(s) Signed: 01/05/2022 8:42:24 AM By: [name] MD FACS Entered By: [name] on 01/05/2022 08:42:24 -------------------------------------------------------------------------------- Physician Orders Details Patient Name: Date of Service: [name], [name] 01/05/2022 8:15 A M Medical Record Number: [id number] Patient Account Number: [id number] Date of Birth/Sex: Treating RN: 08-31-1985 (36 y.o. [name] Primary Care Provider: [name] Other Clinician: Referring Provider: Treating Provider/Extender: [name] in Treatment: [phone] Verbal / Phone Orders: No Diagnosis Coding ICD-10 Coding Code Description E11.621 Type 2 diabetes mellitus with foot ulcer L97.528 Non-pressure chronic ulcer of other part of left foot with other specified severity L97.518 Non-pressure chronic ulcer of other part of right foot with other  specified severity Follow-up Appointments ppointment in 1 week. - Dr. [name] - Room 1 with [name] Return A 01/12/2022 @ 8:15 Bathing/ Shower/ Hygiene May shower and wash wound with soap and water. Edema Control - Lymphedema / SCD / Other Bilateral Lower Extremities Elevate legs to the level of the heart or above for 30 minutes daily and/or when sitting, a frequency of: - throughout the day Avoid standing for long periods of time. Exercise regularly Compression stocking or Garment 20-30 mm/Hg pressure to: - to both legs daily Off-Loading Open toe surgical shoe to: - Both feet Additional Orders / Instructions Follow Nutritious Diet Wound Treatment Wound #3 - Foot Wound Laterality: Plantar, Left, Lateral Cleanser: Soap and Water 1 x Per Day/30 Days Discharge Instructions: May shower and wash wound with dial antibacterial soap and water prior to dressing change. Peri-Wound Care: Zinc Oxide Ointment 30g tube 1 x Per Day/30 Days Discharge Instructions: Apply Zinc Oxide to periwound with each dressing change Peri-Wound Care: Sween Lotion (Moisturizing lotion) 1 x Per Day/30 Days Discharge Instructions: Apply moisturizing lotion as directed Prim Dressing: Promogran Prisma Matrix, 4.34 (sq in) (silver collagen) (Generic) 1 x Per Day/30 Days ary Discharge Instructions: Moisten collagen with saline or hydrogel Secondary Dressing: ABD Pad, 8x10 (Generic) 1 x Per Day/30 Days Discharge Instructions: Apply over primary dressing as directed. Secondary Dressing: Optifoam Non-Adhesive Dressing, 4x4 in (Generic) 1 x Per Day/30 Days Discharge Instructions: Apply over primary dressing as directed. Secondary Dressing: NonWoven Sponge, 4x4 (in/in) (Generic) 1 x Per Day/30 Days Secured With: 66M Medipore H Soft Cloth Surgical T ape, 4 x 10 (in/yd) (Generic) 1 x Per Day/30 Days Discharge Instructions: Secure with tape as directed. Compression Wrap: Kerlix Roll 4.5x3.1 (in/yd) (Generic) 1 x Per Day/30  Days Discharge Instructions: Apply Kerlix and Coban compression as directed. Compression Wrap: 66M ACE Elastic Bandage With VELCRO Brand Closure, 4 (in) (Generic) 1 x Per Day/30 Days Wound #8 - Foot Wound Laterality: Right, Lateral Cleanser: Soap and Water 1 x Per Day/30 Days Discharge Instructions: May shower and wash wound with dial antibacterial soap and water prior to dressing change. Peri-Wound Care: Sween Lotion (Moisturizing lotion) 1 x Per Day/30 Days Discharge Instructions: Apply moisturizing lotion as directed Prim Dressing: Promogran Prisma Matrix, 4.34 (sq in) (silver collagen) (Generic) 1 x Per Day/30 Days ary Discharge Instructions: Moisten collagen with saline or hydrogel Secondary Dressing: ABD Pad, 8x10 (Generic) 1 x Per Day/30 Days Discharge Instructions: Apply over primary dressing as directed. Secondary Dressing: Optifoam Non-Adhesive Dressing, 4x4 in (Generic) 1 x Per  Day/30 Days Discharge Instructions: Apply over primary dressing as directed. Secondary Dressing: NonWoven Sponge, 4x4 (in/in) (Generic) 1 x Per Day/30 Days Secured With: 17M Medipore H Soft Cloth Surgical T ape, 4 x 10 (in/yd) (Generic) 1 x Per Day/30 Days Discharge Instructions: Secure with tape as directed. Compression Wrap: Kerlix Roll 4.5x3.1 (in/yd) (Generic) 1 x Per Day/30 Days Discharge Instructions: Apply Kerlix and Coban compression as directed. Compression Wrap: 17M ACE Elastic Bandage With VELCRO Brand Closure, 4 (in) (Generic) 1 x Per Day/30 Days Electronic Signature(s) Signed: 01/05/2022 8:49:46 AM By: [name] MD FACS Entered By: [name] on 01/05/2022 08:42:44 -------------------------------------------------------------------------------- Problem List Details Patient Name: Date of Service: [name], [name] 01/05/2022 8:15 A M Medical Record Number: [id number] Patient Account Number: [id number] Date of Birth/Sex: Treating RN: January 06, 1986 (36 y.o. M) Primary Care Provider:  [name] Other Clinician: Referring Provider: Treating Provider/Extender: [name] in Treatment: 114 Active Problems ICD-10 Encounter Code Description Active Date MDM Diagnosis E11.621 Type 2 diabetes mellitus with foot ulcer 10/24/2019 No Yes L97.528 Non-pressure chronic ulcer of other part of left foot with other specified 10/24/2019 No Yes severity L97.518 Non-pressure chronic ulcer of other part of right foot with other specified 10/24/2019 No Yes severity Inactive Problems ICD-10 Code Description Active Date Inactive Date L97.518 Non-pressure chronic ulcer of other part of right foot with other specified severity 07/14/2020 07/14/2020 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, right ankle and foot 10/24/2019 10/24/2019 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 08/10/2020 08/10/2020 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, left ankle and foot 10/24/2019 10/24/2019 L97.322 Non-pressure chronic ulcer of left ankle with fat layer exposed 09/29/2021 09/29/2021 B95.62 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 10/24/2019 10/24/2019 classified elsewhere Resolved Problems Electronic Signature(s) Signed: 01/05/2022 8:39:40 AM By: [name] MD FACS Entered By: [name] on 01/05/2022 08:39:40 -------------------------------------------------------------------------------- Progress Note Details Patient Name: Date of Service: [name], [name] 01/05/2022 8:15 A M Medical Record Number: [id number] Patient Account Number: [id number] Date of Birth/Sex: Treating RN: [birth year] (36 y.o. 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 He comes in for review of a wound over the plantar fifth metatarsal head extending into the lateral part of the foot. He was followed for this previously by his podiatrist Dr. Cornelius [name]. As the patient tells his story he went to see podiatry first for a swelling he developed on the lateral part of his fifth metatarsal head in May. He states this was "open" by podiatry and the area closed. He was followed up in June and it was again opened callus removed and it closed promptly. There were plans being made for surgery on the fifth metatarsal head in June however his blood sugar was apparently too high for anesthesia. Apparently the area was debrided and opened again in June and it is never closed since. Looking over the records from podiatry I am really not able to follow this. It was clear when he was first seen it  was before 5/14 at that point he already had a wound. By 5/17 the ulcer was resolved. I do not see anything about a procedure. On 5/28 noted to have pre-ulcerative moderate keratosis. X-ray noted 1/5 contracted toe and tailor's bunion and metatarsal deformity. On a visit date on 09/28/2018 the dorsal part of the left foot it healed and resolved. There was concern about swelling in his lower extremity he was sent to the ER.. As far as I can tell he was seen in the ER on 7/12 with an ulcer on his left foot. A DVT rule out of the left leg was negative. I do not think I have complete records from podiatry but I am not able to verify the procedures this patient states he had. He states after the last procedure the wound has never closed although I am not able to follow  this in the records I have from podiatry. He has not had a recent x-ray The patient has been using Neosporin on the wound. He is wearing a Darco shoe. He is still very active up on his foot working and exercising. Past medical history; type 2 diabetes ketosis-prone, leg swelling with a negative DVT study in July. Non-smoker ABI in our clinic was 0.85 on the left 10/16; substantial wound on the plantar left fifth met head extending laterally almost to the dorsal fifth MTP. We have been using silver alginate we gave him a Darco forefoot off loader. An x-ray did not show evidence of osteomyelitis did note soft tissue emphysema which I think was due to gas tracking through an open wound. There is no doubt in my mind he requires an MRI 10/23; MRI not booked until 3 November at the earliest this is largely due to his glucose sensor in the right arm. We have been using silver alginate. There has been an improvement 10/29; I am still not exactly sure when his MRI is booked for. He says it is the third but it is the 10th in epic. This definitely needs to be done. He is running a low-grade fever today but no other symptoms. No real improvement in the 1 02/26/2019 patient presents today for a follow-up visit here in our clinic he is last been seen in the clinic on October 29. Subsequently we were working on getting MRI to evaluate and see what exactly was going on and where we would need to go from the standpoint of whether or not he had osteomyelitis and again what treatments were going be required. Subsequently the patient ended up being admitted to the hospital on 02/07/2019 and was discharged on 02/14/2019. This is a somewhat interesting admission with a discharge diagnosis of pneumonia due to COVID-19 although he was positive for COVID-19 when tested at the urgent care but negative x2 when he was actually in the hospital. With that being said he did have acute respiratory failure with hypoxia and it  was noted he also have a left foot ulceration with osteomyelitis. With that being said he did require oxygen for his pneumonia and I level 4 L. He was placed on antivirals and steroids for the COVID-19. He was also transferred to the [facility] at one point. Nonetheless he did subsequently discharged home and since being home has done much better in that regard. The CT angiogram did not show any pulmonary embolism. With regard to the osteomyelitis the patient was placed on vancomycin and Zosyn while in the hospital but has been changed to Augmentin at  discharge. It was also recommended that he follow- up with wound care and podiatry. Podiatry however wanted him to see [name] according to the patient prior to them doing anything further. His hemoglobin A1c was 9.9 as noted in the hospital. Have an MRI of the left foot performed while in the hospital on 02/04/2019. 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 There is an overlying plantar open wound noted an abscess tracking back along the lateral aspect of the fifth metatarsal shaft. There is otherwise diffuse cellulitis and mild fasciitis without findings of polymyositis. The patient did have recently pneumonia secondary to COVID-19 I looked in the chart through epic and it does appear that the patient may need to have an additional x-ray just to ensure everything is cleared and that he has no airspace disease prior to putting him into the [name]. 03/05/2019; patient was readmitted to the clinic last week. He was hospitalized twice for a viral upper respiratory tract infection from 11/1 through 11/4 and then 11/5 through 11/12 ultimately this turned out to be Covid pneumonitis. Although he was discharged on oxygen he is not using it. He says he feels fine. He has no exercise limitation no cough no sputum. His O2 sat in our clinic today was 100% on room  air. 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 He received Vanco and Zosyn in the hospital and then was discharged on 2 weeks of Augmentin. I do not see any relevant cultures. He was supposed to follow-up with infectious disease but I do not see that he has an appointment. 12/8; patient saw Dr. Novella Olive of infectious disease last week. He felt that he had had adequate antibiotic therapy. He did not go to follow-up with Dr. Amalia Hailey of podiatry and I have again talked to him about the pros and cons of this. He does not want to consider a ray amputation of this time. He is aware of the risks of recurrence, migration etc. He started HBO today and tolerated this well. He can complete the Augmentin that I gave him last week. I have looked over the lab work that Dr. Chana Bode ordered his C-reactive protein was 3.3 and his sedimentation rate was 17. The C-reactive protein is never really been measurably that high in this patient 12/15; not much change in the wound today however he has undermining along the lateral part of the foot again more extensively than last week. He has some rims of epithelialization. We have been using silver alginate. He is undergoing hyperbarics but did not dive today 12/18; in for his obligatory first total contact cast change. Unfortunately there was pus coming from the undermining area around his fifth metatarsal head. This was cultured but will preclude reapplication of a cast. He is seen in conjunction with HBO 12/24; patient had staph lugdunensis in the wound in the undermining area laterally last time. We put him on doxycycline which should have covered this. The wound looks better today. I am going to give him another week of doxycycline before reattempting the total contact cast 12/31; the patient is completing antibiotics. Hemorrhagic debris in the distal part of  the wound with some undermining distally. He also had hyper granulation. Extensive debridement with a #5 curette. The infected area that was on the lateral part of the fifth met head  is closed over. I do not think he needs any more antibiotics. Patient was seen prior to HBO. Preparations for a total contact cast were made in the cast will be placed post hyperbarics 04/11/19; once again the patient arrives today without complaint. He had been in a cast all week noted that he had heavy drainage this week. This resulted in large raised areas of macerated tissue around the wound 1/14; wound bed looks better slightly smaller. Hydrofera Blue has been changing himself. He had a heavy drainage last week which caused a lot of maceration around the wound so I took him out of a total contact cast he says the drainage is actually better this week He is seen today in conjunction with HBO 1/21; returns to clinic. He was up in [place] for a day or 2 attending a funeral. He comes back in with the wound larger and with a large area of exposed bone. He had osteomyelitis and septic arthritis of the fifth left metatarsal head while he was in hospital. He received IV antibiotics in the hospital for a prolonged period of time then 3 weeks of Augmentin. Subsequently I gave him 2 weeks of doxycycline for more superficial wound infection. When I saw this last week the wound was smaller the surface of the wound looks satisfactory. 1/28; patient missed hyperbarics today. Bone biopsy I did last time showed Enterococcus faecalis and Staphylococcus lugdunensis . He has a wide area of exposed bone. We are going to use silver alginate as of today. I had another ethical discussion with the patient. 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 In this situation I think the likelihood of healing this is low. Therefore I have recommended a ray amputation and with the patient's agreement I have referred him  to Dr. Doran Durand. The other issue is that his compliance with hyperbarics has been minimal because of his work schedule and given his underlying decision I am going to stop this today READMISSION 10/24/2019 MRI 09/29/2019 left foot IMPRESSION: 1. Apparent skin ulceration inferior and lateral to the 5th metatarsal base with underlying heterogeneous T2 signal and enhancement in the subcutaneous fat. Small peripherally enhancing fluid collections along the plantar and lateral aspects of the 5th metatarsal base suspicious for abscesses. 2. Interval amputation through the mid 5th metatarsal with nonspecific low-level marrow edema and enhancement. Given the proximity to the adjacent soft tissue inflammatory changes, osteomyelitis cannot be excluded. 3. The additional bones appear unremarkable. MRI 09/29/2019 right foot IMPRESSION: 1. Soft tissue ulceration lateral to the 5th MTP joint. There is low-level T2 hyperintensity within the 4th and 5th metatarsal heads and adjacent proximal phalanges without abnormal T1 signal or cortical destruction. These findings are nonspecific and could be seen with early marrow edema, hyperemia or early osteomyelitis. No evidence of septic joint. 2. Mild tenosynovitis and synovial enhancement associated with the extensor digitorum tendons at the level of the midfoot. 3. Diffuse low-level muscular T2 hyperintensity and enhancement, most consistent with diabetic myopathy. LEFT FOOT BONE Methicillin resistant staphylococcus aureus Staphylococcus lugdunensis MIC MIC CIPROFLOXACIN >=8 RESISTANT Resistant <=0.5 SENSI... Sensitive CLINDAMYCIN <=0.25 SENS... Sensitive >=8 RESISTANT Resistant ERYTHROMYCIN >=8 RESISTANT Resistant >=8 RESISTANT Resistant GENTAMICIN <=0.5 SENSI... Sensitive <=0.5 SENSI... Sensitive Inducible Clindamycin NEGATIVE Sensitive NEGATIVE Sensitive OXACILLIN >=4 RESISTANT Resistant 2 SENSITIVE Sensitive RIFAMPIN <=0.5 SENSI... Sensitive <=0.5  SENSI... Sensitive TETRACYCLINE <=1 SENSITIVE Sensitive <=1 SENSITIVE Sensitive TRIMETH/SULFA <=10 SENSIT Sensitive <=10 SENSIT Sensitive ... Marland Kitchen.. VANCOMYCIN 1 SENSITIVE Sensitive <=0.5 SENSI... Sensitive Right foot  bone . Component 3 wk ago Specimen Description BONE Special Requests RIGHT 4 METATARSAL SAMPLE B Gram Stain NO WBC SEEN NO ORGANISMS SEEN Culture RARE METHICILLIN RESISTANT STAPHYLOCOCCUS AUREUS NO ANAEROBES ISOLATED Performed at [facility], [place] [address], [place], [place] [address] Report Status 10/08/2019 FINAL Organism ID, Bacteria METHICILLIN RESISTANT STAPHYLOCOCCUS AUREUS Resulting Agency [facility] Susceptibility Methicillin resistant staphylococcus aureus MIC CIPROFLOXACIN >=8 RESISTANT Resistant CLINDAMYCIN <=0.25 SENS... Sensitive ERYTHROMYCIN >=8 RESISTANT Resistant GENTAMICIN <=0.5 SENSI... Sensitive Inducible Clindamycin NEGATIVE Sensitive OXACILLIN >=4 RESISTANT Resistant RIFAMPIN <=0.5 SENSI... Sensitive TETRACYCLINE <=1 SENSITIVE Sensitive TRIMETH/SULFA <=10 SENSIT Sensitive ... VANCOMYCIN 1 SENSITIVE Sensitive This is a patient we had in clinic earlier this year with a wound over his left fifth metatarsal head. 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 Doran Durand on 05/16/2019. He seems to have developed problems with wounds on his bilateral feet in June 2021 although he may have had problems earlier than this. He was in an urgent care with a right foot ulcer on 09/26/2019 and given a course of doxycycline. This was apparently after having trouble getting into see orthopedics. He was seen by podiatry on 09/28/2019 noted to have bilateral lower extremity ulcers including  the left lateral fifth metatarsal base and the right subfifth met head. It was noted that had purulent drainage at that time. He required hospitalization from 6/20 through 7/2. This was because of worsening right foot wounds. He underwent bilateral operative incision and drainage and bone biopsies bilaterally. Culture results are listed above. He has been referred back to clinic by Dr. Jacqualyn Posey of podiatry. He is also followed by Dr. Megan Salon who saw him yesterday. He was discharged from hospital on Zyvox Flagyl and Levaquin and yesterday changed to doxycycline Flagyl and Levaquin. His inflammatory markers on 6/26 showed a sedimentation rate of 129 and a C-reactive protein of 5. This is improved to 14 and 1.3 respectively. This would indicate improvement. ABIs in our clinic today were 1.23 on the right and 1.20 on the left 11/01/2019 on evaluation today patient appears to be doing fairly well in regard to the wounds on his feet at this point. Fortunately there is no signs of active infection at this time. No fevers, chills, nausea, vomiting, or diarrhea. He currently is seeing infectious disease and still under their care at this point. Subsequently he also has both wounds which she has not been using collagen on as he did not receive that in his packaging he did not call us and let us know that. Apparently that just was missed on the order. Nonetheless we will get that straightened out today. 8/9-Patient returns for bilateral foot wounds, using Prisma with hydrogel moistened dressings, and the wounds appear stable. Patient using surgical shoes, avoiding much pressure or weightbearing as much as possible 8/16; patient has bilateral foot wounds. 1 on the right lateral foot proximally the other is on the left mid lateral foot. Both required debridement of callus and thick skin around the wounds. We have been using silver collagen 8/27; patient has bilateral lateral foot wounds. The area on the left  substantially surrounded by callus and dry skin. This was removed from the wound edge. The underlying wound is small. The area on the right measured  somewhat smaller today. 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 Unfortunately I did not update his antibiotics during today's visit. 9/10 I reviewed Dr. Hale Bogus last notes he felt he had completed antibiotics his inflammatory markers were reasonably well controlled. He has a small wound on the lateral left foot and a tiny area on the right which is just above closed. He is using Hydrofera Blue with border foam he has bilateral surgical shoes 9/24; 2 week f/u. doing well. right foot is closed. left foot still undermined. 10/14; right foot remains closed at the fifth met head. The area over the base of the left fifth metatarsal has a small open area but considerable undermining towards the plantar foot. Thick callus skin around this suggests an adequate pressure relief. We have talked about this. He says he is going to go back into his cam boot. I suggested a total contact cast he did not seem enamored with this suggestion 10/26; left foot base of the fifth metatarsal. Same condition as last time. He has skin over the area with an open wound however the skin is not adherent. He went to see Dr. Earleen Newport who did an x-ray and culture of his foot I have not reviewed the x-ray but the patient was not told anything. He is on doxycycline 11/11; since the patient was last here he was in the emergency room on 10/30 he was concerned about swelling in the left foot. They did not do any cultures or x-rays. They changed his antibiotics to cephalexin. Previous culture showed group B strep. The cephalexin is appropriate as doxycycline has less than predictable coverage. Arrives in clinic today with swelling over this area under the wound. He also has a new wound on the right fifth metatarsal  head 11/18; the patient has a difficult wound on the lateral aspect of the left fifth metatarsal head. The wound was almost ballotable last week I opened it slightly expecting to see purulence however there was just bleeding. I cultured this this was negative. X-ray unchanged. We are trying to get an MRI but I am not sure were going to be able to get this through his insurance. He also has an area on the right lateral fifth metatarsal head this looks healthier 12/3; the patient finally got our MRI. Surprisingly this did not show osteomyelitis. I did show the soft tissue ulceration at the lateral plantar aspect of the fifth metatarsal base with a tiny residual 6 mm abscess overlying the superficial fascia I have tried to culture this area I have not been able to get this to grow anything. Nevertheless the protruding tissue looks aggravated. I suspect we should try to treat the underlying "abscess with broad-spectrum antibiotics. I am going to start him on Levaquin and Flagyl. He has much less edema in his legs and I am going to continue to wrap his legs and see him weekly 12/10. I started Levaquin and Flagyl on him last week. He just picked up the Flagyl apparently there was some delay. The worry is the wound on the left fifth metatarsal base which is substantial and worsening. His foot looks like he inverts at the ankle making this a weightbearing surface. Certainly no improvement in fact I think the measurements of this are somewhat worse. We have been using 12/17; he apparently just got the Levaquin yesterday this is 2 weeks after the fact. He has completed the Flagyl. The area over the left  fifth metatarsal base still has protruding granulation tissue although it does not look quite as bad as it did some weeks ago. He has severe bilateral lymphedema although we have not been treating him for wounds on his legs this is definitely going to require compression. There was so much edema in the left I did  not wish to put him in a total contact cast today. I am going to increase his compression from 3-4 layer. The area on the right lateral fifth met head actually look quite good and superficial. 12/23; patient arrived with callus on the right fifth met head and the substantial hyper granulated callused wound on the base of his fifth metatarsal. He says he is completing his Levaquin in 2 days but I do not think that adds up with what I gave him but I will have to double check this. We are using Hydrofera Blue on both areas. My plan is to put the left leg in a cast the week after New Year's 04/06/2020; patient's wounds about the same. Right lateral fifth metatarsal head and left lateral foot over the base of the fifth metatarsal. There is undermining on the left lateral foot which I removed before application of total contact cast continuing with Hydrofera Blue new. Patient tells me he was seen by endocrinology today lab work was done [Dr. Kerr]. Also wondering whether he was referred to cardiology. I went over some lab work from previously does not have chronic renal failure certainly not nephrotic range proteinuria he does have very poorly controlled diabetes but this is not his most updated lab work. Hemoglobin A1c has been over 11 1/10; the patient had a considerable amount of leakage towards mid part of his left foot with macerated skin however the wound surface looks better the area on the right lateral fifth met head is better as well. I am going to change the dressing on the left foot under the total contact cast to silver alginate, continue with Hydrofera Blue on the right. 1/20; patient was in the total contact cast for 10 days. Considerable amount of drainage although the skin around the wound does not look too bad on the left foot. The area on the right fifth metatarsal head is closed. Our nursing staff reports large amount of drainage out of the left lateral foot wound 1/25; continues with  copious amounts of drainage described by our intake staff. PCR culture I did last week showed E. coli and Enterococcus faecalis and low quantities. Multiple resistance genes documented including extended spectrum beta lactamase, MRSA, MRSE, quinolone, tetracycline. The wound is not quite as good this week as it was 5 days ago but about the same size 2/3; continues with copious amounts of malodorous drainage per our intake nurse. The PCR culture I did 2 weeks ago showed E. coli and low quantities of Enterococcus. There were multiple resistance genes detected. I put Neosporin on him last week although this does not seem to have helped. The wound is slightly deeper today. Offloading continues to be an issue here although with the amount of drainage she has a total contact cast is just not going to work 2/10; moderate amount of drainage. Patient reports he cannot get his stocking on over the dressing. I told him we have to do that the nurse gave him suggestions on how to make this work. The wound is on the bottom and lateral part of his left foot. Is cultured predominantly grew low amounts of Enterococcus, E. coli and  anaerobes. There were multiple resistance genes detected including extended spectrum beta lactamase, quinolone, tetracycline. I could not think of an easy oral combination to address this so for now I am going to do topical antibiotics provided by [facility] I think the main agents here are vancomycin and an aminoglycoside. We have to be able to give him access to the wounds to get the topical antibiotic on 2/17; moderate amount of drainage this is unchanged. He has his Keystone topical antibiotic against the deep tissue culture organisms. He has been using this and changing the dressing daily. Silver alginate on the wound surface. 2/24; using Keystone antibiotic with silver alginate on the top. He had too much drainage for a total contact cast at one point although I think that is  improving and I think in the next week or 2 it might be possible to replace a total contact cast I did not do this today. In general the wound surface looks healthy however he continues to have thick rims of skin and subcutaneous tissue around the wide area of the circumference which I debrided 06/04/2020 upon evaluation today patient appears to be doing well in regard to his wound. I do feel like he is showing signs of improvement. There is little bit of callus and dead tissue around the edges of the wound as well as what appears to be a little bit of a sinus tract that is off to the side laterally I would perform debridement to clear that away today. 3/17; left lateral foot. The wound looks about the same as I remember. Not much depth surface looks healthy. No evidence of infection 3/25; left lateral foot. Wound surface looks about the same. Separating epithelium from the circumference. There really is no evidence of infection here however not making progress by my view 3/29; left lateral foot. Surface of the wound again looks reasonably healthy still thick skin and subcutaneous tissue around the wound margins. There is no evidence of infection. One of the concerns being brought up by the nurses has again the amount of drainage vis--vis continued use of a total contact cast 4/5; left lateral foot at roughly the base of the fifth metatarsal. Nice healthy looking granulated tissue with rims of epithelialization. The overall wound measurements are not any better but the tissue looks healthy. The only concern is the amount of drainage although he has no surrounding maceration with what we have been doing recently to absorb fluid and protect his skin. He also has lymphedema. 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 He states that the kerlix/coban under the TCC rolled down and could not get it back up. He has been using an offloading scooter and has somehow hurt his right foot using this device. This happened last week. He states that the side of his right foot developed a blister and opened. The top of his foot also has a few small open wounds he thinks is due to his socks rubbing in his shoes. He has not been using any dressings to the wound. He denies purulent drainage, fever/chills or erythema to the wounds. 4/22; patient presents for 1 week follow-up. He developed new wounds to the right foot that were evaluated at last clinic visit.  He continues to have a total contact cast to the left leg and he reports no issues. He has been using silver collagen to the right foot wounds with no issues. He denies purulent drainage, fever/chills or erythema to the right foot wounds. He has no complaints today 4/25; patient presents for 1 week follow-up. He has a total contact cast of the left leg and reports no issues. He has been using silver alginate to the right foot wound. He denies purulent drainage, fever/chills or erythema to the right foot wounds. 5/2 patient presents for 1 week follow-up. T contact cast on the left. The wound which is on the base of the plantar foot at the base of the fifth metatarsal otal actually looks quite good and dimensions continue to gradually contract. HOWEVER the area on the right lateral fifth metatarsal head is much larger than what I remember from 2 weeks ago. Once more is he has significant levels of hypergranulation. Noteworthy that he had this same hyper granulated response on his wound on the left foot at one point in time. So much so that he I thought there was an underlying fluid collection. Based on this I think this just needs debridement. 5/9; the wound on the left actually continues to be gradually smaller with a  healthy surface. Slight amount of drainage and maceration of the skin around but not too bad. However he has a large wound over the right fifth metatarsal head very much in the same configuration as his left foot wound was initially. I used silver nitrate to address the hyper granulated tissue no mechanical debridement 5/16; area on the left foot did not look as healthy this week deeper thick surrounding macerated skin and subcutaneous tissue. oo The area on the right foot fifth met head was about the same oo The area on the right ankle that we identified last week is completely broken down into an open wound presumably a stocking rubbing issue 5/23; patient has been using a total contact cast to the left side. He has been using silver alginate underneath. He has also been using silver alginate to the right foot wounds. He has no complaints today. He denies any signs of infection. 5/31; the left-sided wound looks some better measure smaller surface granulation looks better. We have been using silver alginate under the total contact cast oo The large area on his right fifth met head and right dorsal foot look about the same still using silver alginate 6/6; neither side is good as I was hoping although the surface area dimensions are better. A lot of maceration on his left and right foot around the wound edge. Area on the dorsal right foot looks better. He says he was traveling. I am not sure what does the amount of maceration around the plantar wounds may be drainage issues 6/13; in general the wound surfaces look quite good on both sides. Macerated skin and raised edges around the wound required debridement although in general especially on the left the surface area seems improved. oo The area on the right dorsal ankle is about the same I thought this would not be such a problem to close 6/20; not much change in either wound although the one on the right looks a little better. Both wounds have  thick macerated edges to the skin requiring debridements. We have been using silver alginate. The area on the dorsal right ankle is still open I thought this would be closed. 6/28; patient comes in today with  a marked deterioration in the right foot wound fifth met head. Wide area of exposed bone this is a drastic change from last time. The area on the left there we have been casting is stagnant. We have been using silver alginate in both wound areas. 7/5; bone culture I did for PCR last time was positive for Pseudomonas, group B strep, Enterococcus and Staph aureus. There was no suggestion of methicillin resistance or ampicillin resistant genes. This was resistant to tetracycline however He comes into the clinic today with the area over his right plantar fifth metatarsal head which had been doing so well 2 weeks ago completely necrotic feeling bone. I do not know that this is going to be salvageable. The left foot wound is certainly no smaller but it has a better surface and is superficial. 7/8; patient called in this morning to say that his total contact cast was rubbing against his foot. He states he is doing fine overall. He denies signs of infection. 7/12; continued deterioration in the wound over the right fifth metatarsal head crumbling bone. This is not going to be salvageable. The patient agrees and wants to be referred to Dr. Doran Durand which we will attempt to arrange as soon as possible. I am going to continue him on antibiotics as long as that takes so I will renew those today. The area on the left foot which is the base of the fifth metatarsal continues to look somewhat better. Healthy looking tissue no depth no debridement is necessary here. 7/20; the patient was kindly seen by Dr. Doran Durand of orthopedics on 10/19/2020. 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 Towards this end we have taken him  out of the total contact cast on the left we will put him in a wrap with Hydrofera Blue. As I understand things surgery is planned for 7/21 7/27; patient had his surgery last Thursday. 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 Apparently everything went well we did not still disturb that today The area on the left foot actually looks quite good. He has been much less mobile which probably explains this he did not seem to do well in the total contact cast secondary to drainage and maceration I think. 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 Fortunately there is no evidence of active infection at this time. No fevers, chills, nausea, vomiting, or diarrhea. Overall I think that he is actually doing extremely well. Nonetheless I do believe that he is staying off of this more following the surgery in his right foot that is the reason the left is doing so great. 8/16; left plantar foot wound. This looks smaller than the last time I saw this he is using Hydrofera Blue. The surgical wound on the right foot is being followed by Dr. Doran Durand we did not look at this today. He has surgical shoes on both feet 8/23; left plantar foot wound not as good this week. Surrounding macerated skin and subcutaneous tissue everything looks moist and wet. I do not think he is offloading this adequately. He is using a surgical shoe Apparently the right foot surgical wound is not open although I did not check his foot 8/31; left plantar foot lateral aspect. Much improved this week. He has no maceration. Some improvement  in the surface area of the wound but most impressively the depth is come in we are using silver alginate. The patient is a Product/process development scientist. He is asked that we write him a letter so he can go back to work. I have also tried to see if we can write something that will allow him to limit the amount  of time that he is on his foot at work. 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 Hopefully the latter can be avoided 9/6; I believe the patient missed an appointment last week. He arrives in today with a wound looking roughly the same certainly no better. Undermining laterally and also inferiorly. We used molecuLight today in training with the patient's permission.. We are using silver alginate 9/21 wound is measuring bigger this week although this may have to do with the aggressive circumferential debridement last week in response to the blush fluorescence on the MolecuLight. Culture I did last week showed significant MSSA and E. coli. I put him on Augmentin but he has not started it yet. We are also going to send this for compounded antibiotics at [facility]. There is no evidence of systemic infection 9/29; silver alginate. His Keystone arrived. He is completing Augmentin in 2 days. Offloading in a cam boot. Moderate drainage per our intake staff 10/5; using silver alginate. He has been using his [place]. He has completed his Augmentin. Per our intake nurse still a lot of drainage, far too much to consider a total contact cast. Wound measures about the same. He had the same undermining area that I defined last week from a roughly 11-3. I remove this today 10/12; using silver alginate he is using the [place]. He comes in for a nurse visit hence we are applying [employer] twice a week. Measuring slightly better today and less notable drainage. Extensive debridement of the wound edge last time 10/18; using topical Keystone and silver alginate and a soft cast. Wound measurements about the same. Drainage was through his soft cast. We are changing this twice a week Tuesdays and Friday 10/25; comes in with moderate drainage. Still using Keystone silver alginate and a soft cast. Wound dimensions completely the same.He has a lot of  edema in the left leg he has lymphedema. Asking for [name] to consider wrapping him as he cannot get his stocking on over the soft cast 11/2; comes in with moderate to large drainage slightly smaller in terms of width we have been using [place]. His wound looks satisfactory but not much improvement 11/4; patient presents today for obligatory cast change. Has no issues or complaints today. He denies signs of infection. 11/9; patient traveled this weekend to [place], was on the cast quite a bit. Staining of the cast with black material from his walking boot. Drainage was not quite as bad as we feared. Using silver alginate and Keystone 11/16; we do not have size for cast therefore we have been putting a soft cast on him since the change on Friday. Still a significant amount of drainage necessitating changing twice a week. We have been using the Keystone at cast changes either hard or soft as well as silver alginate Comes in the clinic with things actually looking fairly good improvement in width. He says his offloading is about the same 02/24/2021 upon evaluation today patient actually comes back in and is doing excellent in regard to his foot ulcer this is  significantly smaller even compared to the last visit. The soft cast seems to have done extremely well for him which is great news. I do not see any signs of infection minimal debridement will be needed today. 11/30; left lateral foot much improved half a centimeter improvement in surface area. No evidence of infection. He seems to be doing better with the soft cast in the TCC therefore we will continue with this. He comes back in later in the week for a change with the nurses. This is due to drainage 12/6; no improvement in dimensions. Under illumination some debris on the surface we have been using silver alginate, soft cast. If there is anything optimistic here he seems to have have less drainage 12/13. Dimensions are improved both length and width and  slightly in depth. Appears to be quite healthy today. Raised edges of this thick skin and callus around the edges however. He is in a soft cast were bringing him back once for a change on Friday. Drainage is better 12/20. Dimensions are improved. He still has raised edges of thick skin and callus around the edges. We are using a soft cast 12/28; comes in today with thick callus around the wound. Using silver under alginate under a soft cast. I do not think there is much improvement in any measurement 2023 04/06/2021; patient was put in a total contact cast. Unfortunately not much change in surface area 1/10; not much different still thick callus and skin around the edge in spite of the total contact cast. This was just debrided last week we have been using the [facility] compounded antibiotic and silver alginate under a total contact cast 1/18 the patient's wound on the left side is doing nicely. smaller HOWEVER he comes in today with a wound on the right foot laterally. blister most likely serosangquenous drainage 1/24; the patient continues to do well in terms of the plantar left foot which is continued to contract using silver alginate under the total contact cast HOWEVER the right lateral foot is bigger with denuded skin around the edges. I used pickups and a #15 scalpel to remove this this looks like the remanence of a large blister. Cannot rule out infection. Culture in this area I did last week showed Staphylococcus lugdunensis few colonies. I am going to try to address this with his [employer] antibiotic that is done so well on the left having linezolid and this should cover the staph 2/1; the patient's wound on his left foot which was the original plantar foot wound thick skin and eschar around the edges even in the total contact cast but the wound surface does not look too bad The real problem is on how his right lateral foot at roughly the base of the fifth metatarsal. The wound is completely  necrotic more worrisome than that there is swelling around the edges of this. We have been using silver alginate on both wounds and Keystone on the right foot. Unfortunately I think he is going to require systemic antibiotics while we await cultures. He did not get the x-ray done that we ordered last week [lost the prescription 2/7; disappointingly in the area on the left foot which we are treating with a total contact cast is still not closed although it is much smaller. He continues to have a lot of callus around the wound edge. -Right lateral foot culture I did last week was negative x-ray also negative for osteomyelitis. 2/15: TCC silver alginate on the left and silver alginate  on the right lateral. No real improvement in either area 05/26/2021: T oday, the wounds are roughly the same size as at his previous visit, post-debridement. He continues to endorse fairly substantial drainage, particularly on the right. He has been in a total contact cast on the left. There is still some callus surrounding this lesion. On the right, the periwound skin is quite macerated, along with surrounding callus. The center of the right-sided wound also has some dark, densely adherent material, which is very difficult to remove. 06/02/2021: Today, both wounds are slightly smaller. He has been using zinc oxide ointment around the right ulcer and the degree of maceration has improved markedly. There continues to be an area of nonviable tissue in the center of the right sided ulcer. The left-sided wound, which has been in the total contact cast. Appears clean and the degree of callus around it is less than previously. 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 He had to take the stairs and both wounds have increased in size. The left foot, which has been in a total contact cast, has developed a tunnel tracking to the lateral aspect of his foot. The nonviable  tissue in the center of the right-sided ulcer remains recalcitrant to debridement. There is significant undermining surrounding the entirety of the left sided wound. 06/16/2021: The elevator at school has been fixed and the patient has been able to avoid putting as much weight on his wounds over the past week. We converted the left foot wound into a single lesion today, but despite this, the wound is actually smaller. The base is healthy with limited periwound callus. On the right, the central necrotic area is still present. He continues to be quite macerated around the right sided wound, despite applying barrier cream. This does, however, have the benefit of softening the callus to make it more easily removable. 06/23/2021: Today, the left wound is smaller. The lateral aspect that had opened up previously is now closed. The wound base has a healthy bed of granulation tissue and minimal slough. Unfortunately, on the right, the wound is larger and continues to be fairly macerated. He has also reopened the wound at his right ankle. He thinks this is due to the gait he has adopted secondary to his total contact cast and boot. 06/30/2021: T oday, both wounds are a little bit larger. The lateral aspect on the left has remained closed. He continues to have significant periwound maceration. The culture that I took from the right sided wound grew a population of bacteria that is not covered by his current [facility] antibiotic. The center of the right- sided wound continues to appear necrotic with nonviable fat. It probes deeper today, but does not reach bone. 07/07/2021: The periwound maceration is a little bit less today. The right lateral foot wound has some areas that appear more viable and the necrotic center also looks a little bit better. The wound on the dorsal surface of his right foot near the ankle is contracting and the surface appears healthy. The left plantar wound surface looks healthy, but there is  some new undermining on the medial portion. He did get his new Keystone antibiotic and began applying that to the right foot wound on Saturday. 07/14/2021: The intake nurse reported substantial drainage from his wounds, but the periwound skin actually looks better than is typical for him. The wound on the dorsal surface of his right foot near the ankle  is smaller and just has a small open area underneath some dried eschar. The left plantar wound surface looks healthy and there has been no significant accumulation of callus. The right lateral foot wound looks quite a bit better, with the central portion, which typically appears necrotic, looking more viable albeit pale. 07/22/2021: His left foot is extremely macerated today. The wound is about the same size. The wound on the dorsal surface of his right foot near the ankle had closed, but he traumatized it removing the dressing and there is a tiny skin tear in that location. The right lateral foot wound is bigger, but the surface appears healthy. 07/30/2021: The wound on the dorsal surface of his right foot near the ankle is closed. The right lateral foot wound again is a little bit bigger due to some undermining. The periwound skin is in better condition, however. He has been applying zinc oxide. The wound surface is a little bit dry today. On the left, he does not have the substantial maceration that we frequently see. The wound itself is smaller and has a clean surface. 08/06/2021: Both wounds seem to have deteriorated over the past week. The right lateral foot wound has a dry surface but the periwound is boggy.. Overall wound dimensions are about the same. On the left, the wound is about the same size, but there is more undermining present underneath periwound callus. 08/13/2021: The right sided wound looks about the same, but on the left there has been substantial deterioration. The undermining continues to extend under periwound callus. Once this was  removed, substantial extension of the wound was present. There is no odor or purulent drainage but clearly the wounds have broken down. 08/20/2021: The wounds look about the same today. He has been out of his total contact cast and has just been changing the dressings using topical Keystone with PolyMem Ag, Kerlix and Ace bandages. The wound on the top of his right ankle has reopened but this is quite small. There was a little bit of purulent material that I expressed when examining this wound. 08/24/2021: After the aggressive debridement I performed at his last visit, the wounds actually look a little bit better today. They are smaller with the exception of the wound on the top of his right ankle which is a little bit bigger as some more skin pulled off when he was changing his dressing. We are using topical Keystone with PolyMem Ag Kerlix and Ace bandages. 09/02/2021: There has been really no change to any of his wounds. 09/16/2021: The patient was hospitalized last week with nausea, vomiting, and dehydration. He says that while he was in the hospital, his wounds were not really addressed properly. T oday, both plantar foot wounds are larger and the periwound skin is macerated. The wound on the dorsum of his right foot has a scab on the top. The right foot now has a crater where previously he had had nonviable fat. It looks as though this simply died and fell out. The periwound callus is wet. 09/24/2021: His wounds have deteriorated somewhat since his last visit. The wound on the dorsum of his right foot near his ankle is larger and has more nonviable tissue present. The crater in his right foot is even deeper; I cannot quite palpate or probe to bone but I am sure it is close. The wound on his left plantar foot has an odd boggy area in the center that almost feels as though it has fluid within it.  He has run out of his topical Keystone antibiotic. We are using silver alginate on his wounds. 09/29/2021: He  has developed a new wound on the dorsum of his left foot near his ankle. He says he thinks his wrapping is rubbing in that site. I would concur with this as the wound on his right ankle is larger. The left foot looks about the same. The right foot has the crater that was present last week. No significant slough accumulation, but his foot remains quite swollen and warm despite oral antibiotic therapy. 10/08/2021: All of his wounds look about the same as last week. He did not start his oral antibiotics that are prescribed until just a couple of days ago; his [employer] compounded antibiotics formula has been changed and he is awaiting delivery of the new recipe. His MRI that was scheduled for earlier this week was canceled as no prior authorization had been obtained; unfortunately the tech responsible sent an email to my old [place] email, which I no longer use nor have access to. 10/18/2021: The wounds on his bilateral dorsal feet near the ankles are both improved. They are smaller and have just some eschar and slough buildup. The left plantar wound has a fair amount of undermining, but the surface is clean. There is some periwound callus accumulation. On the right plantar foot, there is nonviable fat leading to a deep tunnel that tracks towards his dorsal medial foot. There is periwound callus and slough accumulation, as well. His right foot and leg remain swollen as compared to the left. 10/25/2021: The wounds on his bilateral dorsal feet and at the ankles have broken down somewhat. They are little bit larger than last week. The left plantar wound continues to undermine laterally but the surface is clean. The right plantar foot wound shows some decreased depth in the tunnel tracking towards his dorsal medial foot. He has not yet had the Doppler study that I ordered; it sounds like there is some confusion about the scheduling of the procedure. In addition, the MRI was denied and I have taken steps to  appeal the denial. 11/24/2021: Since our last visit, Mr. Cordrey was admitted to the hospital where an MRI suggested osteomyelitis. He was taken the operating room by podiatry. Bone biopsies were negative for osteomyelitis. They debrided his wounds and applied myriad matrix. He saw them last week and they removed his staples. He is here today to continue his wound healing process. T oday, both of the dorsal foot/ankle wounds are substantially smaller. There is just a little eschar overlying the left sided wound and some eschar and slough on the right. The right plantar foot ulcer has the healthiest surface of granulation tissue that I have seen to date. A portion of the myriad matrix failed to take and was hanging loose. It appears that myriad morcells were placed into the tunnel closest to the dorsal portion of his foot. These have sloughed off. The left plantar foot ulcer is about the same size, but has a much healthier surface than in the past. Both plantar ulcers have callus and slough accumulation. 12/07/2021: Left dorsal foot/ankle wound is closed. The right dorsal foot/ankle wound is nearly closed and just has a small open area with some eschar and slough. The right plantar foot wound has contracted quite a bit since our last visit. It has a healthy surface with just a little bit of slough accumulation and periwound callus buildup. The left plantar foot wound is about the same  size but the surface appears healthy. There is a little slough and periwound callus on this side, as well. 12/15/2021: Both dorsal foot/ankle wounds are closed. The right plantar foot wound is substantially smaller than at our last visit. The tunneling that was present has nearly closed. There is just a little bit of slough buildup. The left plantar foot wound is also a little bit smaller today. The surface is the healthiest that I have ever seen it. Light slough and periwound callus accumulation on this side. 12/22/2021:  The dorsal ankle/foot wounds remain closed. The right plantar foot wound continues to contract. There is still a bit of depth at the lateral portion of the wound but the surface has a good granulated appearance. The wound on the left is about the same size, the central indented portion is still adherent. It also is clean with good granulation tissue present. The periwound skin and callus are a bit macerated, however. 12/29/2021: Both ankle wounds remain closed. The right plantar foot wound is less than half the size that it was last week. There is no depth any longer and the surface has just a little bit of slough and periwound callus. On the left, the wound is not much smaller, but the surface is healthier with good granulation tissue. There is also a little slough and periwound callus buildup. 01/05/2022: The right plantar foot wound continues to contract and is once again about half the size as it was last week. There is just a little bit of surface slough and periwound callus. On the left, the depth of the wound has decreased and the diameter has started to contract. It is also very clean with just a little slough and biofilm present. Patient History Information obtained from Patient. Family History No family history of Cancer, Diabetes, Hereditary Spherocytosis, Hypertension, Kidney Disease, Lung Disease, Seizures, Stroke, Thyroid Problems, Tuberculosis. Social History Never smoker, Marital Status - Single, Alcohol Use - Rarely, Drug Use - No History, Caffeine Use - Never. Medical History Eyes Denies history of Cataracts, Glaucoma, Optic Neuritis Ear/Nose/Mouth/Throat Denies history of Chronic sinus problems/congestion, Middle ear problems Hematologic/Lymphatic Denies history of Anemia, Hemophilia, Human Immunodeficiency Virus, Lymphedema, Sickle Cell Disease Respiratory Denies history of Aspiration, Asthma, Chronic Obstructive Pulmonary Disease (COPD), Pneumothorax, Sleep Apnea,  Tuberculosis Cardiovascular Denies history of Angina, Arrhythmia, Congestive Heart Failure, Coronary Artery Disease, Deep Vein Thrombosis, Hypertension, Hypotension, Myocardial Infarction, Peripheral Arterial Disease, Peripheral Venous Disease, Phlebitis, Vasculitis Gastrointestinal Denies history of Cirrhosis , Colitis, Crohnoos, Hepatitis A, Hepatitis B, Hepatitis C Endocrine Patient has history of Type II Diabetes Denies history of Type I Diabetes Immunological Denies history of Lupus Erythematosus, Raynaudoos, Scleroderma Integumentary (Skin) Denies history of History of Burn Musculoskeletal Denies history of Gout, Rheumatoid Arthritis, Osteoarthritis, Osteomyelitis Neurologic Denies history of Dementia, Neuropathy, Quadriplegia, Paraplegia, Seizure Disorder Oncologic Denies history of Received Chemotherapy, Received Radiation Psychiatric Denies history of Anorexia/bulimia, Confinement Anxiety Hospitalization/Surgery History - 11/1-11/06/2018- sepsis foot infection. - 11/4-11/5 02 sats low respiratory distress. Objective Constitutional Slightly hypertensive. No acute distress.. Vitals Time Taken: 8:15 AM, Height: 77 in, Weight: 280 lbs, BMI: 33.2, Temperature: 98.0 F, Pulse: 87 bpm, Respiratory Rate: 18 breaths/min, Blood Pressure: 147/95 mmHg. Respiratory Normal work of breathing on room air.. General Notes: 01/05/2022: The right plantar foot wound continues to contract and is once again about half the size as it was last week. There is just a little bit of surface slough and periwound callus. On the left, the depth of the wound has decreased and the  diameter has started to contract. It is also very clean with just a little slough and biofilm present. Integumentary (Hair, Skin) Wound #3 status is Open. Original cause of wound was Trauma. The date acquired was: 10/02/2019. The wound has been in treatment 114 weeks. The wound is located on the [place]. The  wound measures 3cm length x 3.2cm width x 0.2cm depth; 7.54cm^2 area and 1.508cm^3 volume. There is Fat Layer (Subcutaneous Tissue) exposed. There is no tunneling or undermining noted. There is a medium amount of serosanguineous drainage noted. The wound margin is well defined and not attached to the wound base. There is large (67-100%) red, hyper - granulation within the wound bed. There is a small (1-33%) amount of necrotic tissue within the wound bed including Adherent Slough. The periwound skin appearance had no abnormalities noted for moisture. The periwound skin appearance had no abnormalities noted for color. The periwound skin appearance exhibited: Callus. Periwound temperature was noted as No Abnormality. Wound #8 status is Open. Original cause of wound was Blister. The date acquired was: 04/18/2021. The wound has been in treatment 37 weeks. The wound is located on the Right,Lateral Foot. The wound measures 1.3cm length x 1.9cm width x 0.1cm depth; 1.94cm^2 area and 0.194cm^3 volume. There is Fat Layer (Subcutaneous Tissue) exposed. There is no tunneling or undermining noted. There is a medium amount of serosanguineous drainage noted. The wound margin is well defined and not attached to the wound base. There is large (67-100%) red, pink granulation within the wound bed. There is a small (1-33%) amount of necrotic tissue within the wound bed including Adherent Slough. The periwound skin appearance had no abnormalities noted for moisture. The periwound skin appearance had no abnormalities noted for color. The periwound skin appearance exhibited: Callus. Periwound temperature was noted as No Abnormality. 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 There was a Selective/Open Wound Non-Viable Tissue Debridement with a total area of 9.6 sq cm performed by [name], MD. With the following instrument(s): Curette to remove Non-Viable tissue/material. Material removed includes Callus and Biofilm and. No specimens were taken. A time out was conducted at 08:36, prior to the start of the procedure. A Minimum amount of bleeding was controlled with Pressure. The procedure was tolerated well with a pain level of 0 throughout and a pain level of 0 following the procedure. Post Debridement Measurements: 3cm length x 3.2cm width x 0.2cm depth; 1.508cm^3 volume. Character of Wound/Ulcer Post Debridement is improved. Severity of Tissue Post Debridement is: Fat layer exposed. 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 There was a Selective/Open Wound Non-Viable Tissue Debridement with a total area of 2.47 sq cm performed by [name], MD. With the following instrument(s): Curette to remove Non-Viable tissue/material. Material removed includes Callus and Slough and. No specimens were taken. A time out was conducted at 08:36, prior to the start of the procedure. A Minimum amount of bleeding was controlled with Pressure. The procedure was tolerated well with  a pain level of 0 throughout and a pain level of 0 following the procedure. Post Debridement Measurements: 1.3cm length x 1.9cm width x 0.1cm depth; 0.194cm^3 volume. Character of Wound/Ulcer Post Debridement is improved. Severity of Tissue Post Debridement is: Fat layer exposed. Post procedure Diagnosis Wound #8: Same as  Pre-Procedure Plan Follow-up Appointments: Return Appointment in 1 week. - Dr. [name] - Room 1 with [facility] 01/12/2022 @ 8:15 Bathing/ Shower/ Hygiene: May shower and wash wound with soap and water. Edema Control - Lymphedema / SCD / Other: Elevate legs to the level of the heart or above for 30 minutes daily and/or when sitting, a frequency of: - throughout the day Avoid standing for long periods of time. Exercise regularly Compression stocking or Garment 20-30 mm/Hg pressure to: - to both legs daily Off-Loading: Open toe surgical shoe to: - Both feet Additional Orders / Instructions: Follow Nutritious Diet WOUND #3: - Foot Wound Laterality: Plantar, Left, Lateral Cleanser: Soap and Water 1 x Per Day/30 Days Discharge Instructions: May shower and wash wound with dial antibacterial soap and water prior to dressing change. Peri-Wound Care: Zinc Oxide Ointment 30g tube 1 x Per Day/30 Days Discharge Instructions: Apply Zinc Oxide to periwound with each dressing change Peri-Wound Care: Sween Lotion (Moisturizing lotion) 1 x Per Day/30 Days Discharge Instructions: Apply moisturizing lotion as directed Prim Dressing: Promogran Prisma Matrix, 4.34 (sq in) (silver collagen) (Generic) 1 x Per Day/30 Days ary Discharge Instructions: Moisten collagen with saline or hydrogel Secondary Dressing: ABD Pad, 8x10 (Generic) 1 x Per Day/30 Days Discharge Instructions: Apply over primary dressing as directed. Secondary Dressing: Optifoam Non-Adhesive Dressing, 4x4 in (Generic) 1 x Per Day/30 Days Discharge Instructions: Apply over primary dressing as directed. Secondary Dressing: NonWoven Sponge, 4x4 (in/in) (Generic) 1 x Per Day/30 Days Secured With: 78M Medipore H Soft Cloth Surgical T ape, 4 x 10 (in/yd) (Generic) 1 x Per Day/30 Days Discharge Instructions: Secure with tape as directed. Com pression Wrap: Kerlix Roll 4.5x3.1 (in/yd) (Generic) 1 x Per Day/30 Days Discharge Instructions: Apply Kerlix and  Coban compression as directed. Com pression Wrap: 78M ACE Elastic Bandage With VELCRO Brand Closure, 4 (in) (Generic) 1 x Per Day/30 Days WOUND #8: - Foot Wound Laterality: Right, Lateral Cleanser: Soap and Water 1 x Per Day/30 Days Discharge Instructions: May shower and wash wound with dial antibacterial soap and water prior to dressing change. Peri-Wound Care: Sween Lotion (Moisturizing lotion) 1 x Per Day/30 Days Discharge Instructions: Apply moisturizing lotion as directed Prim Dressing: Promogran Prisma Matrix, 4.34 (sq in) (silver collagen) (Generic) 1 x Per Day/30 Days ary Discharge Instructions: Moisten collagen with saline or hydrogel Secondary Dressing: ABD Pad, 8x10 (Generic) 1 x Per Day/30 Days Discharge Instructions: Apply over primary dressing as directed. Secondary Dressing: Optifoam Non-Adhesive Dressing, 4x4 in (Generic) 1 x Per Day/30 Days Discharge Instructions: Apply over primary dressing as directed. Secondary Dressing: NonWoven Sponge, 4x4 (in/in) (Generic) 1 x Per Day/30 Days Secured With: 78M Medipore H Soft Cloth Surgical T ape, 4 x 10 (in/yd) (Generic) 1 x Per Day/30 Days Discharge Instructions: Secure with tape as directed. Com pression Wrap: Kerlix Roll 4.5x3.1 (in/yd) (Generic) 1 x Per Day/30 Days Discharge Instructions: Apply Kerlix and Coban compression as directed. Com pression Wrap: 78M ACE Elastic Bandage With VELCRO Brand Closure, 4 (in) (Generic) 1 x Per Day/30 Days 01/05/2022: The right plantar foot wound continues to contract and is once again about half the size as it was last week. There is just a little  bit of surface slough and periwound callus. On the left, the depth of the wound has decreased and the diameter has started to contract. It is also very clean with just a little slough and biofilm present. I used a curette to debride callus and slough from the right foot wound and biofilm and slough from the left foot wound. We will continue to use Prisma  silver collagen with Kerlix and Ace wrapping. He will continue to aggressively offload his feet. Follow-up in 1 week. Electronic Signature(s) Signed: 01/05/2022 8:43:29 AM By: [name] MD FACS Entered By: [name] on 01/05/2022 08:43:29 -------------------------------------------------------------------------------- HxROS Details Patient Name: Date of Service: [name], [name] 01/05/2022 8:15 A M Medical Record Number: [id number] Patient Account Number: [id number] Date of Birth/Sex: Treating RN: [birth year] (36 y.o. M) Primary Care Provider: [name] Other Clinician: Referring Provider: Treating Provider/Extender: [name] in Treatment: 85 Information Obtained From Patient Eyes Medical History: Negative for: Cataracts; Glaucoma; Optic Neuritis Ear/Nose/Mouth/Throat Medical History: Negative for: Chronic sinus problems/congestion; Middle ear problems Hematologic/Lymphatic Medical History: Negative for: Anemia; Hemophilia; Human Immunodeficiency Virus; Lymphedema; Sickle Cell Disease Respiratory Medical History: Negative for: Aspiration; Asthma; Chronic Obstructive Pulmonary Disease (COPD); Pneumothorax; Sleep Apnea; Tuberculosis Cardiovascular Medical History: Negative for: Angina; Arrhythmia; Congestive Heart Failure; Coronary Artery Disease; Deep Vein Thrombosis; Hypertension; Hypotension; Myocardial Infarction; Peripheral Arterial Disease; Peripheral Venous Disease; Phlebitis; Vasculitis Gastrointestinal Medical History: Negative for: Cirrhosis ; Colitis; Crohns; Hepatitis A; Hepatitis B; Hepatitis C Endocrine Medical History: Positive for: Type II Diabetes Negative for: Type I Diabetes Time with diabetes: 8 Treated with: Insulin Blood sugar tested every day: No Immunological Medical History: Negative for: Lupus Erythematosus; Raynauds; Scleroderma Integumentary (Skin) Medical History: Negative for: History of  Burn Musculoskeletal Medical History: Negative for: Gout; Rheumatoid Arthritis; Osteoarthritis; Osteomyelitis Neurologic Medical History: Negative for: Dementia; Neuropathy; Quadriplegia; Paraplegia; Seizure Disorder Oncologic Medical History: Negative for: Received Chemotherapy; Received Radiation Psychiatric Medical History: Negative for: Anorexia/bulimia; Confinement Anxiety Immunizations Pneumococcal Vaccine: Received Pneumococcal Vaccination: No Implantable Devices None Hospitalization / Surgery History Type of Hospitalization/Surgery 11/1-11/06/2018- sepsis foot infection 11/4-11/5 02 sats low respiratory distress Family and Social History Cancer: No; Diabetes: No; Hereditary Spherocytosis: No; Hypertension: No; Kidney Disease: No; Lung Disease: No; Seizures: No; Stroke: No; Thyroid Problems: No; Tuberculosis: No; Never smoker; Marital Status - Single; Alcohol Use: Rarely; Drug Use: No History; Caffeine Use: Never; Financial Concerns: No; Food, Clothing or Shelter Needs: No; Support System Lacking: No; Transportation Concerns: No Electronic Signature(s) Signed: 01/05/2022 8:49:46 AM By: [name] MD FACS Entered By: [name] on 01/05/2022 08:41:54 -------------------------------------------------------------------------------- SuperBill Details Patient Name: Date of Service: [name], [name] 01/05/2022 Medical Record Number: [id number] Patient Account Number: [id number] Date of Birth/Sex: Treating RN: March 27, 1986 (36 y.o. 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 L97.518 Non-pressure chronic ulcer of other part of right foot with other specified  severity Facility Procedures Physician Procedures : CPT4 Code Description Modifier [id number] - WC PHYS LEVEL 3 - EST PT 25 ICD-10 Diagnosis Description L97.528 Non-pressure chronic ulcer of other part of left foot with other specified severity L97.518 Non-pressure chronic ulcer of other part of  right foot with other specified severity E11.621 Type 2 diabetes mellitus with foot ulcer Quantity: 1 : [id number] -  WC PHYS DEBR WO ANESTH 20 SQ CM ICD-10 Diagnosis Description L97.528 Non-pressure chronic ulcer of other part of left foot with other specified severity L97.518 Non-pressure chronic ulcer of other part of right foot with other specified  severity Quantity: 1 Electronic Signature(s) Signed: 01/05/2022 8:43:48 AM By: [name] MD FACS Entered By: [name] on 01/05/2022 08:43:46

## 2022-01-12 ENCOUNTER — Encounter (HOSPITAL_BASED_OUTPATIENT_CLINIC_OR_DEPARTMENT_OTHER): Payer: BC Managed Care – PPO | Admitting: General Surgery

## 2022-01-12 DIAGNOSIS — E11621 Type 2 diabetes mellitus with foot ulcer: Secondary | ICD-10-CM | POA: Diagnosis not present

## 2022-01-12 NOTE — Progress Notes (Signed)
[name], [name] ([id number]) [id number] Page 1 of 10 Visit Report for 01/12/2022 Arrival Information Details Patient Name: Date of Service: [name] 01/12/2022 8:15 A M Medical Record Number: [id number] Patient Account Number: [id number] Date of Birth/Sex: Treating RN: 05-11-1985 (36 y.o. [name] Primary Care [name]: [name] Other Clinician: Referring [name]: Treating [name]/Extender: [name] in Treatment: 16 Visit Information History Since Last Visit Added or deleted any medications: No Patient Arrived: Ambulatory Any new allergies or adverse reactions: No Arrival Time: 08:49 Had a fall or experienced change in No Accompanied By: self activities of daily living that may affect Transfer Assistance: None risk of falls: Patient Identification Verified: Yes Signs or symptoms of abuse/neglect since No Secondary Verification Process Completed: Yes last visito Patient Requires Transmission-Based Precautions: No Hospitalized since last visit: No Patient Has Alerts: No Implantable device outside of the clinic No excluding cellular tissue based products placed in the center since last visit: Has Dressing in Place as Prescribed: Yes Has Footwear/Offloading in Place as Yes Prescribed: Left: Surgical Shoe with Pressure Relief Insole Right: Surgical Shoe with Pressure Relief Insole Pain Present Now: No Electronic Signature(s) Signed: 01/12/2022 5:37:12 PM By: Max Gouty RN, BSN Entered By: [name] on 01/12/2022 08:50:12 -------------------------------------------------------------------------------- Encounter Discharge Information Details Patient Name: Date of Service: [name], [name] 01/12/2022 8:15 A M Medical Record Number: [id number] Patient Account Number: [id number] Date of Birth/Sex: Treating RN: 19-Dec-1985 (36 y.o. [name] Primary Care [name]: [name]  Other Clinician: Referring [name]: Treating [name]/Extender: [name] in Treatment: 115 Encounter Discharge Information Items Post Procedure Vitals Discharge Condition: Stable Temperature (F): 97.5 Ambulatory Status: Ambulatory Pulse (bpm): 84 Discharge Destination: Home Respiratory Rate (breaths/min): 18 Transportation: Private Auto Blood Pressure (mmHg): 146/89 Accompanied By: self Schedule Follow-up Appointment: Yes Clinical Summary of Care: Patient Declined Electronic Signature(s) Signed: 01/12/2022 5:37:12 PM By: Max Gouty RN, BSN Entered By: [name] on 01/12/2022 09:25:20 [name], [name] ([id number]) [id number] Page 2 of 10 -------------------------------------------------------------------------------- Lower Extremity Assessment Details Patient Name: Date of Service: [name] 01/12/2022 8:15 A M Medical Record Number: [id number] Patient Account Number: [id number] Date of Birth/Sex: Treating RN: 18-Feb-1986 (36 y.o. [name] Primary Care [name]: [name] Other Clinician: Referring [name]: Treating [name]/Extender: [name] in Treatment: 115 Edema Assessment Assessed: [name]: No] [name]: No] Edema: [Left: Yes] [Right: Yes] Calf Left: Right: Point of Measurement: 48 cm From Medial Instep 47 cm 46.5 cm Ankle Left: Right: Point of Measurement: 11 cm From Medial Instep 27 cm 27 cm Vascular Assessment Pulses: Dorsalis Pedis Palpable: [Left:Yes] [Right:Yes] Electronic Signature(s) Signed: 01/12/2022 5:37:12 PM By: Max Gouty RN, BSN Entered By: [name] on 01/12/2022 08:51:11 -------------------------------------------------------------------------------- Multi Wound Chart Details Patient Name: Date of Service: [name], [name] 01/12/2022 8:15 A M Medical Record Number: [id number] Patient Account Number: [id number] Date of  Birth/Sex: Treating RN: January 10, 1986 (36 y.o. M) Primary Care [name]: [name] Other Clinician: Referring [name]: Treating [name]/Extender: [name] in Treatment: 115 Vital Signs Height(in): 77 Capillary Blood Glucose(mg/dl): 76 Weight(lbs): 280 Pulse(bpm): 84 Body Mass Index(BMI): 33.2 Blood Pressure(mmHg): 146/89 Temperature(F): 97.5 Respiratory Rate(breaths/min): 18 [3:Photos: No Photos Left, Lateral, Plantar Foot Wound Location: Trauma Wounding Event: Diabetic Wound/Ulcer of the Lower Primary Etiology: Extremity Type II Diabetes Comorbid History: 10/02/2019 Date Acquired:] [8:No Photos Right, Lateral Foot Blister Diabetic  Wound/Ulcer of the Lower Extremity Type II Diabetes 04/18/2021] [N/A:N/A N/A N/A  N/A N/A N/A] [name], [name] ([id number]) [3:115 Weeks of Treatment: Open Wound Status: No Wound Recurrence: 3.1x3.4x0.7 Measurements L x W x [name] 8.278 A (cm) : rea 5.795 Volume (cm) : -402.00% % Reduction in A rea: -3412.10% % Reduction in Volume: 3 Starting Position 1 (o'clock): 6 Ending  Position 1 (o'clock): 0.5 Maximum Distance 1 (cm): Yes Undermining: Grade 2 Classification: Medium Exudate A mount: Serosanguineous Exudate Type: red, brown Exudate Color: Well defined, not attached Wound Margin: Large (67-100%) Granulation A mount: Red,  Hyper-granulation, Friable Granulation Quality: Small (1-33%) Necrotic A mount: Fat Layer (Subcutaneous Tissue): Yes Fat Layer (Subcutaneous Tissue): Yes N/A Exposed Structures: Fascia: No Tendon: No Muscle: No Joint: No Bone: No Small (1-33%)  Epithelialization: Debridement - Selective/Open Wound Debridement - Selective/Open Wound N/A Debridement: Pre-procedure Verification/Time Out 08:55 Taken: Lidocaine 4% Topical Solution Pain Control: Callus Tissue Debrided: Skin/Epidermis Level: 14  Debridement A (sq cm): rea Curette Instrument: Minimum Bleeding: Pressure Hemostasis A chieved: 0 Procedural Pain: 0 Post  Procedural Pain: Procedure was tolerated well Debridement Treatment Response: 3.1x3.4x0.7 Post Debridement Measurements L x W x [name]  (cm) 5.795 Post Debridement Volume: (cm) Callus: Yes Periwound Skin Texture: No Abnormalities Noted Periwound Skin Moisture: No Abnormalities Noted Periwound Skin Color: No Abnormality Temperature: Debridement Procedures Performed:] [8:8 Open No  1x1.4x0.1 1.1 0.11 -100.00% 0.00% No Grade 2 Medium Serosanguineous red, brown Well defined, not attached Large (67-100%) Red, Pink Small (1-33%) Fascia: No Tendon: No Muscle: No Joint: No Bone: No Small (1-33%) 08:55 Lidocaine 4% T opical Solution  Callus, Slough Non-Viable Tissue 1.4 Curette Minimum Pressure 0 0 Procedure was tolerated well .1x3.4x0.7 1x1.4x0.1 0.11 Callus: Yes No Abnormalities Noted No Abnormalities Noted No Abnormality Debridement] [N/A:[id number] Page  3 of 10 N/A N/A N/A N/A N/A N/A N/A N/A N/A N/A N/A N/A N/A N/A N/A N/A N/A N/A N/A N/A N/A N/A N/A N/A N/A N/A N/A N/A N/A N/A N/A N/A N/A N/A N/A N/A] Treatment Notes Wound #3 (Foot) Wound Laterality: Plantar, Left, Lateral Cleanser Soap and Water Discharge Instruction: May shower and wash wound with dial antibacterial soap and water prior to dressing change. Peri-Wound Care Zinc Oxide Ointment 30g tube Discharge Instruction: Apply Zinc Oxide to periwound with each dressing change Sween Lotion (Moisturizing lotion) Discharge Instruction: Apply moisturizing lotion as directed Topical Primary Dressing Hydrofera Blue Ready Foam, 4x5 in Discharge Instruction: Apply to wound bed as instructed Secondary Dressing ABD Pad, 8x10 Discharge Instruction: Apply over primary dressing as directed. Optifoam Non-Adhesive Dressing, 4x4 in Discharge Instruction: Apply over primary dressing as directed. NonWoven Sponge, 4x4 (in/in) Secured With 829M Medipore H Soft Cloth Surgical T ape, 4 x 10 (in/yd) Discharge Instruction: Secure with tape  as directed. Majette, [name] ([id number]) [id number] Page 4 of 10 Compression Wrap Kerlix Roll 4.5x3.1 (in/yd) Discharge Instruction: Apply Kerlix and Coban compression as directed. 829M ACE Elastic Bandage With VELCRO Brand Closure, 4 (in) Compression Stockings Add-Ons Wound #8 (Foot) Wound Laterality: Right, Lateral Cleanser Soap and Water Discharge Instruction: May shower and wash wound with dial antibacterial soap and water prior to dressing change. Peri-Wound Care Sween Lotion (Moisturizing lotion) Discharge Instruction: Apply moisturizing lotion as directed Topical Primary Dressing Promogran Prisma Matrix, 4.34 (sq in) (silver collagen) Discharge Instruction: Moisten collagen with saline or hydrogel Secondary Dressing ABD Pad, 8x10 Discharge Instruction: Apply over primary dressing as directed. Optifoam Non-Adhesive Dressing, 4x4 in Discharge Instruction: Apply over primary dressing as directed. NonWoven Sponge, 4x4 (in/in) Secured With 829M Medipore H Soft Cloth Surgical T ape,  4 x 10 (in/yd) Discharge Instruction: Secure with tape as directed. Compression Wrap Kerlix Roll 4.5x3.1 (in/yd) Discharge Instruction: Apply Kerlix and Coban compression as directed. 53M ACE Elastic Bandage With VELCRO Brand Closure, 4 (in) Compression Stockings Add-Ons Electronic Signature(s) Signed: 01/12/2022 9:35:36 AM By: [name] MD FACS Entered By: [name] on 01/12/2022 09:35:35 -------------------------------------------------------------------------------- Multi-Disciplinary Care Plan Details Patient Name: Date of Service: [name], [name] 01/12/2022 8:15 A M Medical Record Number: [id number] Patient Account Number: [id number] Date of Birth/Sex: Treating RN: [birth year] (36 y.o. [name] Primary Care Rosselyn Martha: [name] Other Clinician: Referring Alexandra Lipps: Treating Cashay Manganelli/Extender: [name] in  Treatment: [place] reviewed with physician Active Inactive Nutrition Srey, [name] ([id number]) [id number] Page 5 of 10 Nursing Diagnoses: Imbalanced nutrition Potential for alteratiion in Nutrition/Potential for imbalanced nutrition Goals: Patient/caregiver agrees to and verbalizes understanding of need to use nutritional supplements and/or vitamins as prescribed Date Initiated: 10/24/2019 Date Inactivated: 04/06/2020 Target Resolution Date: 04/03/2020 Goal Status: Met Patient/caregiver will maintain therapeutic glucose control Date Initiated: 10/24/2019 Target Resolution Date: 01/19/2022 Goal Status: Active Interventions: Assess HgA1c results as ordered upon admission and as needed Assess patient nutrition upon admission and as needed per policy Provide education on elevated blood sugars and impact on wound healing Provide education on nutrition Treatment Activities: Education provided on Nutrition : 12/07/2021 Notes: 11/17/20: Glucose control ongoing issue, target date extended. 01/26/21: Glucose management continues. Wound/Skin Impairment Nursing Diagnoses: Impaired tissue integrity Knowledge deficit related to ulceration/compromised skin integrity Goals: Patient/caregiver will verbalize understanding of skin care regimen Date Initiated: 10/24/2019 Target Resolution Date: 01/19/2022 Goal Status: Active Ulcer/skin breakdown will have a volume reduction of 30% by week 4 Date Initiated: 10/24/2019 Date Inactivated: 01/16/2020 Target Resolution Date: 01/10/2020 Unmet Reason: no change in Goal Status: Unmet measurements. Interventions: Assess patient/caregiver ability to obtain necessary supplies Assess patient/caregiver ability to perform ulcer/skin care regimen upon admission and as needed Assess ulceration(s) every visit Provide education on ulcer and skin care Notes: 11/17/20: Wound care regimen continues Electronic  Signature(s) Signed: 01/12/2022 5:37:12 PM By: Max Gouty RN, BSN Entered By: [name] on 01/12/2022 08:57:03 -------------------------------------------------------------------------------- Pain Assessment Details Patient Name: Date of Service: [name], [name] 01/12/2022 8:15 A M Medical Record Number: [id number] Patient Account Number: [id number] Date of Birth/Sex: Treating RN: 1986-01-17 (36 y.o. [name] Primary Care Johnie Makki: [name] Other Clinician: Referring Devaun Hernandez: Treating Chase Arnall/Extender: [name] in Treatment: 115 Active Problems Location of Pain Severity and Description of Pain Patient Has Paino No Site Locations Rate the pain. Vantassel, [name] ([id number]) [id number] Page 6 of 10 Rate the pain. Current Pain Level: 0 Pain Management and Medication Current Pain Management: Electronic Signature(s) Signed: 01/12/2022 5:37:12 PM By: Max Gouty RN, BSN Entered By: [name] on 01/12/2022 08:51:01 -------------------------------------------------------------------------------- Patient/Caregiver Education Details Patient Name: Date of Service: [name], [name] 10/11/2023andnbsp8:15 A M Medical Record Number: [id number] Patient Account Number: [id number] Date of Birth/Gender: Treating RN: [birth year] (36 y.o. [name] Primary Care Physician: [name] Other Clinician: Referring Physician: Treating Physician/Extender: [name] in Treatment: 115 Education Assessment Education Provided To: Patient Education Topics Provided Elevated Blood Sugar/ Impact on Healing: Methods: Explain/Verbal Responses: Reinforcements needed, State content correctly Offloading: Methods: Explain/Verbal Responses: Reinforcements needed, State content correctly Wound/Skin Impairment: Methods: Explain/Verbal Responses: Reinforcements needed, State  content correctly Electronic Signature(s) Signed: 01/12/2022 5:37:12 PM By: Max Gouty RN, BSN Entered By: [name] on 01/12/2022 08:57:33 [employer],  [name] ([id number]) [id number] Page 7 of 10 -------------------------------------------------------------------------------- Wound Assessment Details Patient Name: Date of Service: [name] 01/12/2022 8:15 A M Medical Record Number: [id number] Patient Account Number: [id number] Date of Birth/Sex: Treating RN: April 30, 1985 (36 y.o. [name] Primary Care Sharren [name]: [name] Other Clinician: Referring Aubryana Vittorio: Treating Senan Urey/Extender: [name] in Treatment: 115 Wound Status Wound Number: 3 Primary Etiology: Diabetic Wound/Ulcer of the Lower Extremity Wound Location: Left, Lateral, Plantar Foot Wound Status: Open Wounding Event: Trauma Comorbid History: Type II Diabetes Date Acquired: 10/02/2019 Weeks Of Treatment: 115 Clustered Wound: No Photos Photo Uploaded By: Donavan Burnet on 01/12/2022 17:42:15 Wound Measurements Length: (cm) 3.1 Width: (cm) 3.4 Depth: (cm) 0.7 Area: (cm) 8.278 Volume: (cm) 5.795 % Reduction in Area: -402% % Reduction in Volume: -3412.1% Epithelialization: Small (1-33%) Tunneling: No Undermining: Yes Starting Position (o'clock): 3 Ending Position (o'clock): 6 Maximum Distance: (cm) 0.5 Wound Description Classification: Grade 2 Wound Margin: Well defined, not attached Exudate Amount: Medium Exudate Type: Serosanguineous Exudate Color: red, brown Foul Odor After Cleansing: No Slough/Fibrino Yes Wound Bed Granulation Amount: Large (67-100%) Exposed Structure Granulation Quality: Red, Hyper-granulation, Friable Fascia Exposed: No Necrotic Amount: Small (1-33%) Fat Layer (Subcutaneous Tissue) Exposed: Yes Necrotic Quality: Adherent Slough Tendon Exposed: No Muscle Exposed: No Joint Exposed: No Bone  Exposed: No Periwound Skin Texture Texture Color No Abnormalities Noted: No No Abnormalities Noted: Yes Callus: Yes Temperature / Pain Temperature: No Abnormality Moisture No Abnormalities Noted: Yes Treatment Notes [place], [name] ([id number]) [id number] Page 8 of 10 Wound #3 (Foot) Wound Laterality: Plantar, Left, Lateral Cleanser Soap and Water Discharge Instruction: May shower and wash wound with dial antibacterial soap and water prior to dressing change. Peri-Wound Care Zinc Oxide Ointment 30g tube Discharge Instruction: Apply Zinc Oxide to periwound with each dressing change Sween Lotion (Moisturizing lotion) Discharge Instruction: Apply moisturizing lotion as directed Topical Primary Dressing Hydrofera Blue Ready Foam, 4x5 in Discharge Instruction: Apply to wound bed as instructed Secondary Dressing ABD Pad, 8x10 Discharge Instruction: Apply over primary dressing as directed. Optifoam Non-Adhesive Dressing, 4x4 in Discharge Instruction: Apply over primary dressing as directed. NonWoven Sponge, 4x4 (in/in) Secured With 56M Medipore H Soft Cloth Surgical T ape, 4 x 10 (in/yd) Discharge Instruction: Secure with tape as directed. Compression Wrap Kerlix Roll 4.5x3.1 (in/yd) Discharge Instruction: Apply Kerlix and Coban compression as directed. 56M ACE Elastic Bandage With VELCRO Brand Closure, 4 (in) Compression Stockings Add-Ons Electronic Signature(s) Signed: 01/12/2022 5:37:12 PM By: Max Gouty RN, BSN Entered By: [name] on 01/12/2022 08:52:28 -------------------------------------------------------------------------------- Wound Assessment Details Patient Name: Date of Service: [name], [name] 01/12/2022 8:15 A M Medical Record Number: [id number] Patient Account Number: [id number] Date of Birth/Sex: Treating RN: Apr 12, 1985 (36 y.o. [name] Primary Care Batool Majid: [name] Other Clinician: Referring  Jameriah Trotti: Treating Raylin Winer/Extender: [name] in Treatment: 115 Wound Status Wound Number: 8 Primary Etiology: Diabetic Wound/Ulcer of the Lower Extremity Wound Location: Right, Lateral Foot Wound Status: Open Wounding Event: Blister Comorbid History: Type II Diabetes Date Acquired: 04/18/2021 Weeks Of Treatment: 38 Clustered Wound: No Photos Radin, [name] ([id number]) [id number] Page 9 of 10 Photo Uploaded By: Donavan Burnet on 01/12/2022 17:42:27 Wound Measurements Length: (cm) Width: (cm) Depth: (cm) Area: (cm) Volume: (cm) 1 % Reduction in Area: -100% 1.4 % Reduction in Volume: 0% 0.1 Epithelialization: Small (1-33%) 1.1 Tunneling: No 0.11 Undermining: No Wound Description Classification: Grade 2 Wound Margin: Well defined, not attached Exudate Amount: Medium Exudate Type:  Serosanguineous Exudate Color: red, brown Foul Odor After Cleansing: No Slough/Fibrino Yes Wound Bed Granulation Amount: Large (67-100%) Exposed Structure Granulation Quality: Red, Pink Fascia Exposed: No Necrotic Amount: Small (1-33%) Fat Layer (Subcutaneous Tissue) Exposed: Yes Necrotic Quality: Adherent Slough Tendon Exposed: No Muscle Exposed: No Joint Exposed: No Bone Exposed: No Periwound Skin Texture Texture Color No Abnormalities Noted: No No Abnormalities Noted: Yes Callus: Yes Temperature / Pain Temperature: No Abnormality Moisture No Abnormalities Noted: Yes Treatment Notes Wound #8 (Foot) Wound Laterality: Right, Lateral Cleanser Soap and Water Discharge Instruction: May shower and wash wound with dial antibacterial soap and water prior to dressing change. Peri-Wound Care Sween Lotion (Moisturizing lotion) Discharge Instruction: Apply moisturizing lotion as directed Topical Primary Dressing Promogran Prisma Matrix, 4.34 (sq in) (silver collagen) Discharge Instruction: Moisten collagen with saline or  hydrogel Secondary Dressing ABD Pad, 8x10 Discharge Instruction: Apply over primary dressing as directed. Optifoam Non-Adhesive Dressing, 4x4 in Discharge Instruction: Apply over primary dressing as directed. NonWoven Sponge, 4x4 (in/in) Secured With 32M Medipore H Soft Cloth Surgical T ape, 4 x 10 (in/yd) Discharge Instruction: Secure with tape as directed. Mast, [name] ([id number]) [id number] Page 10 of 10 Compression Wrap Kerlix Roll 4.5x3.1 (in/yd) Discharge Instruction: Apply Kerlix and Coban compression as directed. 32M ACE Elastic Bandage With VELCRO Brand Closure, 4 (in) Compression Stockings Add-Ons Electronic Signature(s) Signed: 01/12/2022 5:37:12 PM By: Max Gouty RN, BSN Entered By: [name] on 01/12/2022 08:53:02 -------------------------------------------------------------------------------- Vitals Details Patient Name: Date of Service: [name], [name] 01/12/2022 8:15 A M Medical Record Number: [id number] Patient Account Number: [id number] Date of Birth/Sex: Treating RN: [birth year] (36 y.o. [name] Primary Care Etienne Millward: [name] Other Clinician: Referring Tuyen Uncapher: Treating Kismet Facemire/Extender: [name] in Treatment: 115 Vital Signs Time Taken: 08:40 Temperature (F): 97.5 Height (in): 77 Pulse (bpm): 84 Weight (lbs): 280 Respiratory Rate (breaths/min): 18 Body Mass Index (BMI): 33.2 Blood Pressure (mmHg): 146/89 Capillary Blood Glucose (mg/dl): 76 Reference Range: 80 - 120 mg / dl Notes glucose per pt report this am Electronic Signature(s) Signed: 01/12/2022 5:37:12 PM By: Max Gouty RN, BSN Entered By: [name] on 01/12/2022 08:50:53

## 2022-01-12 NOTE — Progress Notes (Signed)
[name], Max ([id number]) [id number] Page 1 of 22 Visit Report for 01/12/2022 Chief Complaint Document Details Patient Name: Date of Service: [name] 01/12/2022 8:15 A M Medical Record Number: [id number] Patient Account Number: [id number] Date of Birth/Sex: Treating RN: [birth year] (36 y.o. M) Primary Care Provider: [name] Other Clinician: Referring Provider: Treating Provider/Extender: [name] in Treatment: [place] from: Patient Chief Complaint 01/11/2019; patient is here for review of a rather substantial wound over the left fifth plantar metatarsal head extending into the lateral part of his foot 10/24/2019; patient returns to clinic with wounds on his bilateral feet with underlying osteomyelitis biopsy-proven Electronic Signature(s) Signed: 01/12/2022 9:35:47 AM By: [name] MD FACS Entered By: [name] on 01/12/2022 09:35:46 -------------------------------------------------------------------------------- Debridement Details Patient Name: Date of Service: [name], [name] 01/12/2022 8:15 A M Medical Record Number: [id number] Patient Account Number: [id number] Date of Birth/Sex: Treating RN: 08-21-1985 (36 y.o. [name] Primary Care Provider: [name] Other Clinician: Referring Provider: Treating Provider/Extender: [name] in Treatment: 115 Debridement Performed for Assessment: Wound #3 Left,Lateral,Plantar Foot Performed By: Physician [name], MD Debridement Type: Debridement Severity of Tissue Pre Debridement: Fat layer exposed Level of Consciousness (Pre-procedure): Awake and Alert Pre-procedure Verification/Time Out Yes - 08:55 Taken: Start Time: 08:57 Pain Control: Lidocaine 4% T opical Solution T Area Debrided (L x W): otal 3.5 (cm) x 4 (cm) = 14 (cm) Tissue and other material debrided: Non-Viable,  Callus, Skin: Epidermis, Biofilm Level: Skin/Epidermis Debridement Description: Selective/Open Wound Instrument: Curette Bleeding: Minimum Hemostasis Achieved: Pressure Procedural Pain: 0 Post Procedural Pain: 0 Response to Treatment: Procedure was tolerated well Level of Consciousness (Post- Awake and Alert procedure): Post Debridement Measurements of Total Wound Length: (cm) 3.1 Width: (cm) 3.4 Depth: (cm) 0.7 Volume: (cm) 5.795 Character of Wound/Ulcer Post Debridement: Improved Severity of Tissue Post Debridement: Fat layer exposed [place], Max ([id number]) [id number] Page 2 of 22 Post Procedure Diagnosis Same as Pre-procedure Notes scribed by Baruch Gouty, RN for Dr. [name] Electronic Signature(s) Signed: 01/12/2022 12:06:00 PM By: [name] MD FACS Signed: 01/12/2022 5:37:12 PM By: Baruch Gouty RN, BSN Entered By: Baruch Gouty on 01/12/2022 09:05:49 -------------------------------------------------------------------------------- Debridement Details Patient Name: Date of Service: [name], [name] 01/12/2022 8:15 A M Medical Record Number: [id number] Patient Account Number: [id number] Date of Birth/Sex: Treating RN: 1986/03/31 (36 y.o. [name] Primary Care Provider: [name] Other Clinician: Referring Provider: Treating Provider/Extender: [name] in Treatment: 115 Debridement Performed for Assessment: Wound #8 Right,Lateral Foot Performed By: Physician [name], MD Debridement Type: Debridement Severity of Tissue Pre Debridement: Fat layer exposed Level of Consciousness (Pre-procedure): Awake and Alert Pre-procedure Verification/Time Out Yes - 08:55 Taken: Start Time: 08:57 Pain Control: Lidocaine 4% T opical Solution T Area Debrided (L x W): otal 1 (cm) x 1.4 (cm) = 1.4 (cm) Tissue and other material debrided: Non-Viable, Callus, Slough, Slough Level:  Non-Viable Tissue Debridement Description: Selective/Open Wound Instrument: Curette Bleeding: Minimum Hemostasis Achieved: Pressure Procedural Pain: 0 Post Procedural Pain: 0 Response to Treatment: Procedure was tolerated well Level of Consciousness (Post- Awake and Alert procedure): Post Debridement Measurements of Total Wound Length: (cm) 1 Width: (cm) 1.4 Depth: (cm) 0.1 Volume: (cm) 0.11 Character of Wound/Ulcer Post Debridement: Improved Severity of Tissue Post Debridement: Fat layer exposed Post Procedure Diagnosis Same as Pre-procedure Notes scribed by Baruch Gouty, RN for Dr. [name] Electronic Signature(s) Signed: 01/12/2022 12:06:00 PM By: [name] MD  FACS Signed: 01/12/2022 5:37:12 PM By: Baruch Gouty RN, BSN Entered By: Baruch Gouty on 01/12/2022 09:06:00 Henne, Max ([id number]) [id number] Page 3 of 22 -------------------------------------------------------------------------------- HPI Details Patient Name: Date of Service: [name] 01/12/2022 8:15 A M Medical Record Number: [id number] Patient Account Number: [id number] Date of Birth/Sex: Treating RN: [birth year] (36 y.o. 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 He comes in for review of a wound over the plantar fifth metatarsal head extending into the lateral part of the foot. He was followed for this previously by his podiatrist Dr. Cornelius Moras. As the patient tells his story he went to see podiatry first for a swelling he developed on the lateral part of his fifth metatarsal head in May. He states this was "open" by podiatry and the area closed. He was followed up in June and it was again  opened callus removed and it closed promptly. There were plans being made for surgery on the fifth metatarsal head in June however his blood sugar was apparently too high for anesthesia. Apparently the area was debrided and opened again in June and it is never closed since. Looking over the records from podiatry I am really not able to follow this. It was clear when he was first seen it was before 5/14 at that point he already had a wound. By 5/17 the ulcer was resolved. I do not see anything about a procedure. On 5/28 noted to have pre-ulcerative moderate keratosis. X-ray noted 1/5 contracted toe and tailor's bunion and metatarsal deformity. On a visit date on 09/28/2018 the dorsal part of the left foot it healed and resolved. There was concern about swelling in his lower extremity he was sent to the ER.. As far as I can tell he was seen in the ER on 7/12 with an ulcer on his left foot. A DVT rule out of the left leg was negative. I do not think I have complete records from podiatry but I am not able to verify the procedures this patient states he had. He states after the last procedure the wound has never closed although I am not able to follow this in the records I have from podiatry. He has not had a recent x-ray The patient has been using Neosporin on the wound. He is wearing a Darco shoe. He is still very active up on his foot working and exercising. Past medical history; type 2 diabetes ketosis-prone, leg swelling with a negative DVT study in July. Non-smoker ABI in our clinic was 0.85 on the left 10/16; substantial wound on the plantar left fifth met head extending laterally almost to the dorsal fifth MTP. We have been using silver alginate we gave him a Darco forefoot off loader. An x-ray did not show evidence of osteomyelitis did note soft tissue emphysema which I think was due to gas tracking through an open wound. There is no doubt in my mind he requires an MRI 10/23; MRI not booked until 3  November at the earliest this is largely due to his glucose sensor in the right arm. We have been using silver alginate. There has been an improvement 10/29; I am still not exactly sure when his MRI is booked for. He says it is the third but it is the 10th in epic. This  definitely needs to be done. He is running a low-grade fever today but no other symptoms. No real improvement in the 1 02/26/2019 patient presents today for a follow-up visit here in our clinic he is last been seen in the clinic on October 29. Subsequently we were working on getting MRI to evaluate and see what exactly was going on and where we would need to go from the standpoint of whether or not he had osteomyelitis and again what treatments were going be required. Subsequently the patient ended up being admitted to the hospital on 02/07/2019 and was discharged on 02/14/2019. This is a somewhat interesting admission with a discharge diagnosis of pneumonia due to COVID-19 although he was positive for COVID-19 when tested at the urgent care but negative x2 when he was actually in the hospital. With that being said he did have acute respiratory failure with hypoxia and it was noted he also have a left foot ulceration with osteomyelitis. With that being said he did require oxygen for his pneumonia and I level 4 L. He was placed on antivirals and steroids for the COVID-19. He was also transferred to the [place] at one point. Nonetheless he did subsequently discharged home and since being home has done much better in that regard. The CT angiogram did not show any pulmonary embolism. With regard to the osteomyelitis the patient was placed on vancomycin and Zosyn while in the hospital but has been changed to Augmentin at discharge. It was also recommended that he follow- up with wound care and podiatry. Podiatry however wanted him to see [name] according to the patient prior to them doing anything further. His hemoglobin A1c was 9.9  as noted in the hospital. Have an MRI of the left foot performed while in the hospital on 02/04/2019. 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 There is an overlying plantar open wound noted an abscess tracking back along the lateral aspect of the fifth metatarsal shaft. There is otherwise diffuse cellulitis and mild fasciitis without findings of polymyositis. The patient did have recently pneumonia secondary to COVID-19 I looked in the chart through epic and it does appear that the patient may need to have an additional x-ray just to ensure everything is cleared and that he has no airspace disease prior to putting him into the [name]. 03/05/2019; patient was readmitted to the clinic last week. He was hospitalized twice for a viral upper respiratory tract infection from 11/1 through 11/4 and then 11/5 through 11/12 ultimately this turned out to be Covid pneumonitis. Although he was discharged on oxygen he is not using it. He says he feels fine. He has no exercise limitation no cough no sputum. His O2 sat in our clinic today was 100% on room air. 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 He received Vanco and Zosyn in the hospital and then was discharged on 2 weeks of Augmentin. I do not see any relevant cultures. He was supposed to follow-up with infectious disease but I do not see that he has an appointment. 12/8; patient saw Dr. Novella Olive of infectious disease last week. He felt that he had had adequate antibiotic therapy. He did not go to follow-up with Dr. Amalia Hailey of podiatry and I have again talked to him about the pros and cons of this. He  does not want to consider a ray amputation of this time. He is aware of the risks of recurrence, migration etc. He started HBO today and tolerated this  well. He can complete the Augmentin that I gave him last week. I have looked over the lab work that Dr. Chana Bode ordered his C-reactive protein was 3.3 and his sedimentation rate was 17. The C-reactive protein is never really been measurably that high in this patient 12/15; not much change in the wound today however he has undermining along the lateral part of the foot again more extensively than last week. He has some rims of epithelialization. We have been using silver alginate. He is undergoing hyperbarics but did not dive today 12/18; in for his obligatory first total contact cast change. Unfortunately there was pus coming from the undermining area around his fifth metatarsal head. This was cultured but will preclude reapplication of a cast. He is seen in conjunction with HBO 12/24; patient had staph lugdunensis in the wound in the undermining area laterally last time. We put him on doxycycline which should have covered this. The wound looks better today. I am going to give him another week of doxycycline before reattempting the total contact cast 12/31; the patient is completing antibiotics. Hemorrhagic debris in the distal part of the wound with some undermining distally. He also had hyper granulation. Extensive debridement with a #5 curette. The infected area that was on the lateral part of the fifth met head is closed over. I do not think he needs any more antibiotics. Patient was seen prior to HBO. Preparations for a total contact cast were made in the cast will be placed post hyperbarics 04/11/19; once again the patient arrives today without complaint. He had been in a cast all week noted that he had heavy drainage this week. This resulted in large raised areas of macerated tissue around the wound 1/14; wound bed looks better slightly smaller. Hydrofera Blue has been changing himself. He had a heavy drainage last week which caused a lot of maceration Tirrell, Max ([id number])  [phone].pdf Page 4 of 22 around the wound so I took him out of a total contact cast he says the drainage is actually better this week He is seen today in conjunction with HBO 1/21; returns to clinic. He was up in [place] for a day or 2 attending a funeral. He comes back in with the wound larger and with a large area of exposed bone. He had osteomyelitis and septic arthritis of the fifth left metatarsal head while he was in hospital. He received IV antibiotics in the hospital for a prolonged period of time then 3 weeks of Augmentin. Subsequently I gave him 2 weeks of doxycycline for more superficial wound infection. When I saw this last week the wound was smaller the surface of the wound looks satisfactory. 1/28; patient missed hyperbarics today. Bone biopsy I did last time showed Enterococcus faecalis and Staphylococcus lugdunensis . He has a wide area of exposed bone. We are going to use silver alginate as of today. I had another ethical discussion with the patient. 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 In this situation I think the likelihood of healing this is low. Therefore I have recommended a ray amputation and with the patient's agreement I have referred him to Dr. Doran Durand. The other issue is that his compliance with hyperbarics has been minimal because of his work schedule and given his underlying decision I am going to stop  this today READMISSION 10/24/2019 MRI 09/29/2019 left foot IMPRESSION: 1. Apparent skin ulceration inferior and lateral to the 5th metatarsal base with underlying heterogeneous T2 signal and enhancement in the subcutaneous fat. Small peripherally enhancing fluid collections along the plantar and lateral aspects of the 5th metatarsal base suspicious for abscesses. 2. Interval amputation through the mid 5th metatarsal with nonspecific low-level marrow edema and enhancement. Given the proximity to the  adjacent soft tissue inflammatory changes, osteomyelitis cannot be excluded. 3. The additional bones appear unremarkable. MRI 09/29/2019 right foot IMPRESSION: 1. Soft tissue ulceration lateral to the 5th MTP joint. There is low-level T2 hyperintensity within the 4th and 5th metatarsal heads and adjacent proximal phalanges without abnormal T1 signal or cortical destruction. These findings are nonspecific and could be seen with early marrow edema, hyperemia or early osteomyelitis. No evidence of septic joint. 2. Mild tenosynovitis and synovial enhancement associated with the extensor digitorum tendons at the level of the midfoot. 3. Diffuse low-level muscular T2 hyperintensity and enhancement, most consistent with diabetic myopathy. LEFT FOOT BONE Methicillin resistant staphylococcus aureus Staphylococcus lugdunensis MIC MIC CIPROFLOXACIN >=8 RESISTANT Resistant <=0.5 SENSI... Sensitive CLINDAMYCIN <=0.25 SENS... Sensitive >=8 RESISTANT Resistant ERYTHROMYCIN >=8 RESISTANT Resistant >=8 RESISTANT Resistant GENTAMICIN <=0.5 SENSI... Sensitive <=0.5 SENSI... Sensitive Inducible Clindamycin NEGATIVE Sensitive NEGATIVE Sensitive OXACILLIN >=4 RESISTANT Resistant 2 SENSITIVE Sensitive RIFAMPIN <=0.5 SENSI... Sensitive <=0.5 SENSI... Sensitive TETRACYCLINE <=1 SENSITIVE Sensitive <=1 SENSITIVE Sensitive TRIMETH/SULFA <=10 SENSIT Sensitive <=10 SENSIT Sensitive ... Marland Kitchen.. VANCOMYCIN 1 SENSITIVE Sensitive <=0.5 SENSI... Sensitive Right foot bone . Component 3 wk ago Specimen Description BONE Special Requests RIGHT 4 METATARSAL SAMPLE B Gram Stain NO WBC SEEN NO ORGANISMS SEEN Culture RARE METHICILLIN RESISTANT STAPHYLOCOCCUS AUREUS NO ANAEROBES ISOLATED Performed at [facility], [place] [address], [place], [place] [address] Report Status 10/08/2019 FINAL Organism ID, Bacteria METHICILLIN RESISTANT STAPHYLOCOCCUS AUREUS Resulting Agency [facility] Susceptibility Methicillin  resistant staphylococcus aureus MIC CIPROFLOXACIN >=8 RESISTANT Resistant CLINDAMYCIN <=0.25 SENS... Sensitive ERYTHROMYCIN >=8 RESISTANT Resistant GENTAMICIN <=0.5 SENSI... Sensitive Inducible Clindamycin NEGATIVE Sensitive OXACILLIN >=4 RESISTANT Resistant RIFAMPIN <=0.5 SENSI... Sensitive TETRACYCLINE <=1 SENSITIVE Sensitive TRIMETH/SULFA <=10 SENSIT Sensitive ... VANCOMYCIN 1 SENSITIVE Sensitive This is a patient we had in clinic earlier this year with a wound over his left fifth metatarsal head. 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 Doran Durand on 05/16/2019. He seems to have developed problems with wounds on his bilateral feet in June 2021 although he may have had problems earlier than this. He was in an urgent care with a right foot ulcer on 09/26/2019 and given a course of doxycycline. This was apparently after having trouble getting into see orthopedics. He was seen by podiatry on 09/28/2019 noted to have bilateral lower extremity ulcers including the left lateral fifth metatarsal base and the right subfifth met head. It was noted that had purulent drainage at that time. He required hospitalization from 6/20 through 7/2. This was because of worsening right foot wounds. He underwent bilateral operative incision and drainage and bone biopsies bilaterally. Culture results are listed above. He has been referred back to clinic by Dr. Jacqualyn Posey of podiatry. He is also followed by Dr. Megan Salon who saw him yesterday. He was discharged  from hospital on Zyvox Flagyl and Levaquin and yesterday changed to doxycycline Flagyl and Levaquin. His  inflammatory markers on 6/26 showed a sedimentation rate of 129 and a C-reactive protein of 5. This is improved to 14 and 1.3 respectively. This would indicate improvement. ABIs in our clinic today were 1.23 on the right and 1.20 on the left 11/01/2019 on evaluation today patient appears to be doing fairly well in regard to the wounds on his feet at this point. Fortunately there is no signs of active infection at this time. No fevers, chills, nausea, vomiting, or diarrhea. He currently is seeing infectious disease and still under their care at this point. Subsequently he also has both wounds which she has not been using collagen on as he did not receive that in his packaging he did not call us and let us know that. Apparently that just was missed on the order. Nonetheless we will get that straightened out today. 8/9-Patient returns for bilateral foot wounds, using Prisma with hydrogel moistened dressings, and the wounds appear stable. Patient using surgical shoes, avoiding much pressure or weightbearing as much as possible 8/16; patient has bilateral foot wounds. 1 on the right lateral foot proximally the other is on the left mid lateral foot. Both required debridement of callus and thick skin around the wounds. We have been using silver collagen 8/27; patient has bilateral lateral foot wounds. The area on the left substantially surrounded by callus and dry skin. This was removed from the wound edge. The underlying wound is small. The area on the right measured somewhat smaller today. 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 Unfortunately I did not update his antibiotics during today's visit. 9/10 I reviewed Dr. Hale Bogus last notes he felt he had completed antibiotics his inflammatory markers were reasonably well controlled. He has a small wound on the lateral left foot and a tiny area on the right which is just above closed. He is  using Hydrofera Blue with border foam he has bilateral surgical shoes 9/24; 2 week f/u. doing well. right foot is closed. left foot still undermined. 10/14; right foot remains closed at the fifth met head. The area over the base of the left fifth metatarsal has a small open area but considerable undermining towards the plantar foot. Thick callus skin around this suggests an adequate pressure relief. We have talked about this. He says he is going to go back into his cam boot. I suggested a total contact cast he did not seem enamored with this suggestion 10/26; left foot base of the fifth metatarsal. Same condition as last time. He has skin over the area with an open wound however the skin is not adherent. He went to see Dr. Earleen Newport who did an x-ray and culture of his foot I have not reviewed the x-ray but the patient was not told anything. He is on doxycycline 11/11; since the patient was last here he was in the emergency room on 10/30 he was concerned about swelling in the left foot. They did not do any cultures or x-rays. They changed his antibiotics to cephalexin. Previous culture showed group B strep. The cephalexin is appropriate as doxycycline has less than predictable coverage. Arrives in clinic today with swelling over this area under the wound. He also has a new wound on the right fifth metatarsal head 11/18; the patient has a difficult wound on the lateral aspect of the left fifth metatarsal head. The  wound was almost ballotable last week I opened it slightly expecting to see purulence however there was just bleeding. I cultured this this was negative. X-ray unchanged. We are trying to get an MRI but I am not sure were going to be able to get this through his insurance. He also has an area on the right lateral fifth metatarsal head this looks healthier 12/3; the patient finally got our MRI. Surprisingly this did not show osteomyelitis. I did show the soft tissue ulceration at the lateral plantar  aspect of the fifth metatarsal base with a tiny residual 6 mm abscess overlying the superficial fascia I have tried to culture this area I have not been able to get this to grow anything. Nevertheless the protruding tissue looks aggravated. I suspect we should try to treat the underlying "abscess with broad-spectrum antibiotics. I am going to start him on Levaquin and Flagyl. He has much less edema in his legs and I am going to continue to wrap his legs and see him weekly 12/10. I started Levaquin and Flagyl on him last week. He just picked up the Flagyl apparently there was some delay. The worry is the wound on the left fifth metatarsal base which is substantial and worsening. His foot looks like he inverts at the ankle making this a weightbearing surface. Certainly no improvement in fact I think the measurements of this are somewhat worse. We have been using 12/17; he apparently just got the Levaquin yesterday this is 2 weeks after the fact. He has completed the Flagyl. The area over the left fifth metatarsal base still has protruding granulation tissue although it does not look quite as bad as it did some weeks ago. He has severe bilateral lymphedema although we have not been treating him for wounds on his legs this is definitely going to require compression. There was so much edema in the left I did not wish to put him in a total contact cast today. I am going to increase his compression from 3-4 layer. The area on the right lateral fifth met head actually look quite good and superficial. 12/23; patient arrived with callus on the right fifth met head and the substantial hyper granulated callused wound on the base of his fifth metatarsal. He says he is completing his Levaquin in 2 days but I do not think that adds up with what I gave him but I will have to double check this. We are using Hydrofera Blue on both areas. My plan is to put the left leg in a cast the week after New Year's 04/06/2020;  patient's wounds about the same. Right lateral fifth metatarsal head and left lateral foot over the base of the fifth metatarsal. There is undermining on the left lateral foot which I removed before application of total contact cast continuing with Hydrofera Blue new. Patient tells me he was seen by endocrinology today lab work was done [Dr. Kerr]. Also wondering whether he was referred to cardiology. I went over some lab work from previously does not have chronic renal failure certainly not nephrotic range proteinuria he does have very poorly controlled diabetes but this is not his most updated lab work. Hemoglobin A1c has been over 11 1/10; the patient had a considerable amount of leakage towards mid part of his left foot with macerated skin however the wound surface looks better the area on the right lateral fifth met head is better as well. I am going to change the dressing on the left foot  under the total contact cast to silver alginate, continue with Hydrofera Blue on the right. 1/20; patient was in the total contact cast for 10 days. Considerable amount of drainage although the skin around the wound does not look too bad on the left foot. The area on the right fifth metatarsal head is closed. Our nursing staff reports large amount of drainage out of the left lateral foot wound 1/25; continues with copious amounts of drainage described by our intake staff. PCR culture I did last week showed E. coli and Enterococcus faecalis and low quantities. Multiple resistance genes documented including extended spectrum beta lactamase, MRSA, MRSE, quinolone, tetracycline. The wound is not quite as good this week as it was 5 days ago but about the same size 2/3; continues with copious amounts of malodorous drainage per our intake nurse. The PCR culture I did 2 weeks ago showed E. coli and low quantities of Enterococcus. There were multiple resistance genes detected. I put Neosporin on him last week although  this does not seem to have helped. The wound is slightly deeper today. Offloading continues to be an issue here although with the amount of drainage she has a total contact cast is just not going to work 2/10; moderate amount of drainage. Patient reports he cannot get his stocking on over the dressing. I told him we have to do that the nurse gave him suggestions on how to make this work. The wound is on the bottom and lateral part of his left foot. Is cultured predominantly grew low amounts of Enterococcus, E. coli and anaerobes. There were multiple resistance genes detected including extended spectrum beta lactamase, quinolone, tetracycline. I could not think of an easy oral combination to address this so for now I am going to do topical antibiotics provided by [facility] I think the main agents here are vancomycin and an aminoglycoside. We have to be able to give him access to the wounds to get the topical antibiotic on 2/17; moderate amount of drainage this is unchanged. He has his Keystone topical antibiotic against the deep tissue culture organisms. He has been using this and changing the dressing daily. Silver alginate on the wound surface. 2/24; using Keystone antibiotic with silver alginate on the top. He had too much drainage for a total contact cast at one point although I think that is improving and I think in the next week or 2 it might be possible to replace a total contact cast I did not do this today. In general the wound surface looks healthy however he continues to have thick rims of skin and subcutaneous tissue around the wide area of the circumference which I debrided 06/04/2020 upon evaluation today patient appears to be doing well in regard to his wound. I do feel like he is showing signs of improvement. There is little bit of callus and dead tissue around the edges of the wound as well as what appears to be a little bit of a sinus tract that is off to the side laterally I would  perform debridement to clear that away today. 3/17; left lateral foot. The wound looks about the same as I remember. Not much depth surface looks healthy. No evidence of infection 3/25; left lateral foot. Wound surface looks about the same. Separating epithelium from the circumference. There really is no evidence of infection here however not making progress by my view [place], Max ([id number]) [id number] Page 6 of 22 3/29; left lateral foot. Surface of the wound again looks reasonably  healthy still thick skin and subcutaneous tissue around the wound margins. There is no evidence of infection. One of the concerns being brought up by the nurses has again the amount of drainage vis--vis continued use of a total contact cast 4/5; left lateral foot at roughly the base of the fifth metatarsal. Nice healthy looking granulated tissue with rims of epithelialization. The overall wound measurements are not any better but the tissue looks healthy. The only concern is the amount of drainage although he has no surrounding maceration with what we have been doing recently to absorb fluid and protect his skin. He also has lymphedema. 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 He states that the kerlix/coban under the TCC rolled down and could not get it back up. He has been using an offloading scooter and has somehow hurt his right foot using this device. This happened last week. He states that the side of his right foot developed a blister and opened. The top of his foot also has a few small open wounds he thinks is due to his socks rubbing in his shoes. He has not been using any dressings to the wound. He denies purulent drainage,  fever/chills or erythema to the wounds. 4/22; patient presents for 1 week follow-up. He developed new wounds to the right foot that were evaluated at last clinic visit. He continues to have a total contact cast to the left leg and he reports no issues. He has been using silver collagen to the right foot wounds with no issues. He denies purulent drainage, fever/chills or erythema to the right foot wounds. He has no complaints today 4/25; patient presents for 1 week follow-up. He has a total contact cast of the left leg and reports no issues. He has been using silver alginate to the right foot wound. He denies purulent drainage, fever/chills or erythema to the right foot wounds. 5/2 patient presents for 1 week follow-up. T contact cast on the left. The wound which is on the base of the plantar foot at the base of the fifth metatarsal otal actually looks quite good and dimensions continue to gradually contract. HOWEVER the area on the right lateral fifth metatarsal head is much larger than what I remember from 2 weeks ago. Once more is he has significant levels of hypergranulation. Noteworthy that he had this same hyper granulated response on his wound on the left foot at one point in time. So much so that he I thought there was an underlying fluid collection. Based on this I think this just needs debridement. 5/9; the wound on the left actually continues to be gradually smaller with a healthy surface. Slight amount of drainage and maceration of the skin around but not too bad. However he has a large wound over the right fifth metatarsal head very much in the same configuration as his left foot wound was initially. I used silver nitrate to address the hyper granulated tissue no mechanical debridement 5/16; area on the left foot did not look as healthy this week deeper thick surrounding macerated skin and subcutaneous tissue. The area on the right foot fifth met head was about the same The area on the  right ankle that we identified last week is  completely broken down into an open wound presumably a stocking rubbing issue 5/23; patient has been using a total contact cast to the left side. He has been using silver alginate underneath. He has also been using silver alginate to the right foot wounds. He has no complaints today. He denies any signs of infection. 5/31; the left-sided wound looks some better measure smaller surface granulation looks better. We have been using silver alginate under the total contact cast The large area on his right fifth met head and right dorsal foot look about the same still using silver alginate 6/6; neither side is good as I was hoping although the surface area dimensions are better. A lot of maceration on his left and right foot around the wound edge. Area on the dorsal right foot looks better. He says he was traveling. I am not sure what does the amount of maceration around the plantar wounds may be drainage issues 6/13; in general the wound surfaces look quite good on both sides. Macerated skin and raised edges around the wound required debridement although in general especially on the left the surface area seems improved. The area on the right dorsal ankle is about the same I thought this would not be such a problem to close 6/20; not much change in either wound although the one on the right looks a little better. Both wounds have thick macerated edges to the skin requiring debridements. We have been using silver alginate. The area on the dorsal right ankle is still open I thought this would be closed. 6/28; patient comes in today with a marked deterioration in the right foot wound fifth met head. Wide area of exposed bone this is a drastic change from last time. The area on the left there we have been casting is stagnant. We have been using silver alginate in both wound areas. 7/5; bone culture I did for PCR last time was positive for Pseudomonas, group B strep,  Enterococcus and Staph aureus. There was no suggestion of methicillin resistance or ampicillin resistant genes. This was resistant to tetracycline however He comes into the clinic today with the area over his right plantar fifth metatarsal head which had been doing so well 2 weeks ago completely necrotic feeling bone. I do not know that this is going to be salvageable. The left foot wound is certainly no smaller but it has a better surface and is superficial. 7/8; patient called in this morning to say that his total contact cast was rubbing against his foot. He states he is doing fine overall. He denies signs of infection. 7/12; continued deterioration in the wound over the right fifth metatarsal head crumbling bone. This is not going to be salvageable. The patient agrees and wants to be referred to Dr. Doran Durand which we will attempt to arrange as soon as possible. I am going to continue him on antibiotics as long as that takes so I will renew those today. The area on the left foot which is the base of the fifth metatarsal continues to look somewhat better. Healthy looking tissue no depth no debridement is necessary here. 7/20; the patient was kindly seen by Dr. Doran Durand of orthopedics on 10/19/2020. 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 Towards this end we have taken him out of the total contact cast on the left we will put him in a wrap with Hydrofera  Blue. As I understand things surgery is planned for 7/21 7/27; patient had his surgery last Thursday. 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 Apparently everything went well we did not still disturb that today The area on the left foot actually looks quite good. He has been much less mobile which probably explains this he did not seem to do well in the total contact cast secondary to drainage and maceration I think. We have been using Hydrofera  Blue 11/09/2020 upon evaluation today patient appears to be doing well with regard to his plantar foot ulcer on the left foot. Fortunately there is no evidence of active infection at this time. No fevers, chills, nausea, vomiting, or diarrhea. Overall I think that he is actually doing extremely well. Nonetheless I do believe that he is staying off of this more following the surgery in his right foot that is the reason the left is doing so great. 8/16; left plantar foot wound. This looks smaller than the last time I saw this he is using Hydrofera Blue. The surgical wound on the right foot is being followed by Dr. Doran Durand we did not look at this today. He has surgical shoes on both feet 8/23; left plantar foot wound not as good this week. Surrounding macerated skin and subcutaneous tissue everything looks moist and wet. I do not think he is offloading this adequately. He is using a surgical shoe Apparently the right foot surgical wound is not open although I did not check his foot 8/31; left plantar foot lateral aspect. Much improved this week. He has no maceration. Some improvement in the surface area of the wound but most impressively the depth is come in we are using silver alginate. The patient is a Product/process development scientist. He is asked that we write him a letter so he can go back to work. I have also tried to see if we can write something that will allow him to limit the amount of time that he is on his foot at work. 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 Hopefully the latter can be avoided 9/6; I believe the patient missed an appointment last week. He arrives in today with a wound looking roughly the same certainly no better. Undermining laterally [place], Max ([id number]) [id number] Page 7 of 22 and also inferiorly. We used molecuLight today in training with the patient's permission.. We  are using silver alginate 9/21 wound is measuring bigger this week although this may have to do with the aggressive circumferential debridement last week in response to the blush fluorescence on the MolecuLight. Culture I did last week showed significant MSSA and E. coli. I put him on Augmentin but he has not started it yet. We are also going to send this for compounded antibiotics at [facility]. There is no evidence of systemic infection 9/29; silver alginate. His Keystone arrived. He is completing Augmentin in 2 days. Offloading in a cam boot. Moderate drainage per our intake staff 10/5; using silver alginate. He has been using his [place]. He has completed his Augmentin. Per our intake nurse still a lot of drainage, far too much to consider a total contact cast. Wound measures about the same. He had the same undermining area that I defined last week from a roughly 11-3. I remove this today 10/12; using silver alginate he is using the [place]. He comes in for a nurse visit hence we are applying  Keystone twice a week. Measuring slightly better today and less notable drainage. Extensive debridement of the wound edge last time 10/18; using topical Keystone and silver alginate and a soft cast. Wound measurements about the same. Drainage was through his soft cast. We are changing this twice a week Tuesdays and Friday 10/25; comes in with moderate drainage. Still using Keystone silver alginate and a soft cast. Wound dimensions completely the same.He has a lot of edema in the left leg he has lymphedema. Asking for [name] to consider wrapping him as he cannot get his stocking on over the soft cast 11/2; comes in with moderate to large drainage slightly smaller in terms of width we have been using [place]. His wound looks satisfactory but not much improvement 11/4; patient presents today for obligatory cast change. Has no issues or complaints today. He denies signs of infection. 11/9; patient traveled this  weekend to [place], was on the cast quite a bit. Staining of the cast with black material from his walking boot. Drainage was not quite as bad as we feared. Using silver alginate and Keystone 11/16; we do not have size for cast therefore we have been putting a soft cast on him since the change on Friday. Still a significant amount of drainage necessitating changing twice a week. We have been using the Keystone at cast changes either hard or soft as well as silver alginate Comes in the clinic with things actually looking fairly good improvement in width. He says his offloading is about the same 02/24/2021 upon evaluation today patient actually comes back in and is doing excellent in regard to his foot ulcer this is significantly smaller even compared to the last visit. The soft cast seems to have done extremely well for him which is great news. I do not see any signs of infection minimal debridement will be needed today. 11/30; left lateral foot much improved half a centimeter improvement in surface area. No evidence of infection. He seems to be doing better with the soft cast in the TCC therefore we will continue with this. He comes back in later in the week for a change with the nurses. This is due to drainage 12/6; no improvement in dimensions. Under illumination some debris on the surface we have been using silver alginate, soft cast. If there is anything optimistic here he seems to have have less drainage 12/13. Dimensions are improved both length and width and slightly in depth. Appears to be quite healthy today. Raised edges of this thick skin and callus around the edges however. He is in a soft cast were bringing him back once for a change on Friday. Drainage is better 12/20. Dimensions are improved. He still has raised edges of thick skin and callus around the edges. We are using a soft cast 12/28; comes in today with thick callus around the wound. Using silver under alginate under a soft cast. I  do not think there is much improvement in any measurement 2023 04/06/2021; patient was put in a total contact cast. Unfortunately not much change in surface area 1/10; not much different still thick callus and skin around the edge in spite of the total contact cast. This was just debrided last week we have been using the [facility] compounded antibiotic and silver alginate under a total contact cast 1/18 the patient's wound on the left side is doing nicely. smaller HOWEVER he comes in today with a wound on the right foot laterally. blister most likely serosangquenous drainage 1/24; the patient  continues to do well in terms of the plantar left foot which is continued to contract using silver alginate under the total contact cast HOWEVER the right lateral foot is bigger with denuded skin around the edges. I used pickups and a #15 scalpel to remove this this looks like the remanence of a large blister. Cannot rule out infection. Culture in this area I did last week showed Staphylococcus lugdunensis few colonies. I am going to try to address this with his [employer] antibiotic that is done so well on the left having linezolid and this should cover the staph 2/1; the patient's wound on his left foot which was the original plantar foot wound thick skin and eschar around the edges even in the total contact cast but the wound surface does not look too bad The real problem is on how his right lateral foot at roughly the base of the fifth metatarsal. The wound is completely necrotic more worrisome than that there is swelling around the edges of this. We have been using silver alginate on both wounds and Keystone on the right foot. Unfortunately I think he is going to require systemic antibiotics while we await cultures. He did not get the x-ray done that we ordered last week [lost the prescription 2/7; disappointingly in the area on the left foot which we are treating with a total contact cast is still not closed  although it is much smaller. He continues to have a lot of callus around the wound edge. -Right lateral foot culture I did last week was negative x-ray also negative for osteomyelitis. 2/15: TCC silver alginate on the left and silver alginate on the right lateral. No real improvement in either area 05/26/2021: T oday, the wounds are roughly the same size as at his previous visit, post-debridement. He continues to endorse fairly substantial drainage, particularly on the right. He has been in a total contact cast on the left. There is still some callus surrounding this lesion. On the right, the periwound skin is quite macerated, along with surrounding callus. The center of the right-sided wound also has some dark, densely adherent material, which is very difficult to remove. 06/02/2021: Today, both wounds are slightly smaller. He has been using zinc oxide ointment around the right ulcer and the degree of maceration has improved markedly. There continues to be an area of nonviable tissue in the center of the right sided ulcer. The left-sided wound, which has been in the total contact cast. Appears clean and the degree of callus around it is less than previously. 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 He had to take the stairs and both wounds have increased in size. The left foot, which has been in a total contact cast, has developed a tunnel tracking to the lateral aspect of his foot. The nonviable tissue in the center of the right-sided ulcer remains recalcitrant to debridement. There is significant undermining surrounding the entirety of the left sided wound. 06/16/2021: The elevator at school has been fixed and the patient has been able to avoid putting as much weight on his wounds over the past week. We converted the left foot wound into a single lesion today, but despite this, the wound is actually smaller. The base is healthy with  limited periwound callus. On the right, the central necrotic area is still present. He continues to be quite macerated around the right sided wound, despite applying barrier cream. This does, however, have  the benefit of softening the callus to make it more easily removable. 06/23/2021: Today, the left wound is smaller. The lateral aspect that had opened up previously is now closed. The wound base has a healthy bed of granulation tissue and minimal slough. Unfortunately, on the right, the wound is larger and continues to be fairly macerated. He has also reopened the wound at his right ankle. He thinks this is due to the gait he has adopted secondary to his total contact cast and boot. 06/30/2021: T oday, both wounds are a little bit larger. The lateral aspect on the left has remained closed. He continues to have significant periwound maceration. The culture that I took from the right sided wound grew a population of bacteria that is not covered by his current [facility] antibiotic. The center of the right- sided wound continues to appear necrotic with nonviable fat. It probes deeper today, but does not reach bone. [place], Max ([id number]) [id number] Page 8 of 22 07/07/2021: The periwound maceration is a little bit less today. The right lateral foot wound has some areas that appear more viable and the necrotic center also looks a little bit better. The wound on the dorsal surface of his right foot near the ankle is contracting and the surface appears healthy. The left plantar wound surface looks healthy, but there is some new undermining on the medial portion. He did get his new Keystone antibiotic and began applying that to the right foot wound on Saturday. 07/14/2021: The intake nurse reported substantial drainage from his wounds, but the periwound skin actually looks better than is typical for him. The wound on the dorsal surface of his right foot near the ankle is smaller  and just has a small open area underneath some dried eschar. The left plantar wound surface looks healthy and there has been no significant accumulation of callus. The right lateral foot wound looks quite a bit better, with the central portion, which typically appears necrotic, looking more viable albeit pale. 07/22/2021: His left foot is extremely macerated today. The wound is about the same size. The wound on the dorsal surface of his right foot near the ankle had closed, but he traumatized it removing the dressing and there is a tiny skin tear in that location. The right lateral foot wound is bigger, but the surface appears healthy. 07/30/2021: The wound on the dorsal surface of his right foot near the ankle is closed. The right lateral foot wound again is a little bit bigger due to some undermining. The periwound skin is in better condition, however. He has been applying zinc oxide. The wound surface is a little bit dry today. On the left, he does not have the substantial maceration that we frequently see. The wound itself is smaller and has a clean surface. 08/06/2021: Both wounds seem to have deteriorated over the past week. The right lateral foot wound has a dry surface but the periwound is boggy.. Overall wound dimensions are about the same. On the left, the wound is about the same size, but there is more undermining present underneath periwound callus. 08/13/2021: The right sided wound looks about the same, but on the left there has been substantial deterioration. The undermining continues to extend under periwound callus. Once this was removed, substantial extension of the wound was present. There is no odor or purulent drainage but clearly the wounds have broken down. 08/20/2021: The wounds look about the same today. He has been out of his total contact cast and has  just been changing the dressings using topical Keystone with PolyMem Ag, Kerlix and Ace bandages. The wound on the top of his right  ankle has reopened but this is quite small. There was a little bit of purulent material that I expressed when examining this wound. 08/24/2021: After the aggressive debridement I performed at his last visit, the wounds actually look a little bit better today. They are smaller with the exception of the wound on the top of his right ankle which is a little bit bigger as some more skin pulled off when he was changing his dressing. We are using topical Keystone with PolyMem Ag Kerlix and Ace bandages. 09/02/2021: There has been really no change to any of his wounds. 09/16/2021: The patient was hospitalized last week with nausea, vomiting, and dehydration. He says that while he was in the hospital, his wounds were not really addressed properly. T oday, both plantar foot wounds are larger and the periwound skin is macerated. The wound on the dorsum of his right foot has a scab on the top. The right foot now has a crater where previously he had had nonviable fat. It looks as though this simply died and fell out. The periwound callus is wet. 09/24/2021: His wounds have deteriorated somewhat since his last visit. The wound on the dorsum of his right foot near his ankle is larger and has more nonviable tissue present. The crater in his right foot is even deeper; I cannot quite palpate or probe to bone but I am sure it is close. The wound on his left plantar foot has an odd boggy area in the center that almost feels as though it has fluid within it. He has run out of his topical Keystone antibiotic. We are using silver alginate on his wounds. 09/29/2021: He has developed a new wound on the dorsum of his left foot near his ankle. He says he thinks his wrapping is rubbing in that site. I would concur with this as the wound on his right ankle is larger. The left foot looks about the same. The right foot has the crater that was present last week. No significant slough accumulation, but his foot remains quite swollen and  warm despite oral antibiotic therapy. 10/08/2021: All of his wounds look about the same as last week. He did not start his oral antibiotics that are prescribed until just a couple of days ago; his [employer] compounded antibiotics formula has been changed and he is awaiting delivery of the new recipe. His MRI that was scheduled for earlier this week was canceled as no prior authorization had been obtained; unfortunately the tech responsible sent an email to my old [employer] email, which I no longer use nor have access to. 10/18/2021: The wounds on his bilateral dorsal feet near the ankles are both improved. They are smaller and have just some eschar and slough buildup. The left plantar wound has a fair amount of undermining, but the surface is clean. There is some periwound callus accumulation. On the right plantar foot, there is nonviable fat leading to a deep tunnel that tracks towards his dorsal medial foot. There is periwound callus and slough accumulation, as well. His right foot and leg remain swollen as compared to the left. 10/25/2021: The wounds on his bilateral dorsal feet and at the ankles have broken down somewhat. They are little bit larger than last week. The left plantar wound continues to undermine laterally but the surface is clean. The right plantar foot wound  shows some decreased depth in the tunnel tracking towards his dorsal medial foot. He has not yet had the Doppler study that I ordered; it sounds like there is some confusion about the scheduling of the procedure. In addition, the MRI was denied and I have taken steps to appeal the denial. 11/24/2021: Since our last visit, Mr. Oettinger was admitted to the hospital where an MRI suggested osteomyelitis. He was taken the operating room by podiatry. Bone biopsies were negative for osteomyelitis. They debrided his wounds and applied myriad matrix. He saw them last week and they removed his staples. He is here today to continue his wound  healing process. T oday, both of the dorsal foot/ankle wounds are substantially smaller. There is just a little eschar overlying the left sided wound and some eschar and slough on the right. The right plantar foot ulcer has the healthiest surface of granulation tissue that I have seen to date. A portion of the myriad matrix failed to take and was hanging loose. It appears that myriad morcells were placed into the tunnel closest to the dorsal portion of his foot. These have sloughed off. The left plantar foot ulcer is about the same size, but has a much healthier surface than in the past. Both plantar ulcers have callus and slough accumulation. 12/07/2021: Left dorsal foot/ankle wound is closed. The right dorsal foot/ankle wound is nearly closed and just has a small open area with some eschar and slough. The right plantar foot wound has contracted quite a bit since our last visit. It has a healthy surface with just a little bit of slough accumulation and periwound callus buildup. The left plantar foot wound is about the same size but the surface appears healthy. There is a little slough and periwound callus on this side, as well. 12/15/2021: Both dorsal foot/ankle wounds are closed. The right plantar foot wound is substantially smaller than at our last visit. The tunneling that was present has nearly closed. There is just a little bit of slough buildup. The left plantar foot wound is also a little bit smaller today. The surface is the healthiest that I have ever seen it. Light slough and periwound callus accumulation on this side. 12/22/2021: The dorsal ankle/foot wounds remain closed. The right plantar foot wound continues to contract. There is still a bit of depth at the lateral portion of the wound but the surface has a good granulated appearance. The wound on the left is about the same size, the central indented portion is still adherent. It also is clean with good granulation tissue present. The  periwound skin and callus are a bit macerated, however. 12/29/2021: Both ankle wounds remain closed. The right plantar foot wound is less than half the size that it was last week. There is no depth any longer and the surface has just a little bit of slough and periwound callus. On the left, the wound is not much smaller, but the surface is healthier with good granulation tissue. There is also a little slough and periwound callus buildup. 01/05/2022: The right plantar foot wound continues to contract and is once again about half the size as it was last week. There is just a little bit of surface slough and periwound callus. On the left, the depth of the wound has decreased and the diameter has started to contract. It is also very clean with just a little slough and biofilm present. Moncur, Max ([id number]) [id number] Page 9 of 22 01/12/2022: The right plantar foot wound is  smaller again today. There is just some periwound callus and slough accumulation. On the left, there is a fair amount of undermining and the surface is a little boggy, but no other significant change. Electronic Signature(s) Signed: 01/12/2022 9:37:09 AM By: [name] MD FACS Entered By: [name] on 01/12/2022 09:37:09 -------------------------------------------------------------------------------- Physical Exam Details Patient Name: Date of Service: [name], [name] 01/12/2022 8:15 A M Medical Record Number: [id number] Patient Account Number: [id number] Date of Birth/Sex: Treating RN: 05-Aug-1985 (36 y.o. M) Primary Care Provider: [name] Other Clinician: Referring Provider: Treating Provider/Extender: [name] in Treatment: 115 Constitutional Slightly hypertensive. . . . No acute distress.Marland Kitchen Respiratory Normal work of breathing on room air.. Notes 01/12/2022: The right plantar foot wound is smaller again today. There is just some  periwound callus and slough accumulation. On the left, there is a fair amount of undermining and the surface is a little boggy, but no other significant change. Electronic Signature(s) Signed: 01/12/2022 9:37:38 AM By: [name] MD FACS Entered By: [name] on 01/12/2022 09:37:38 -------------------------------------------------------------------------------- Physician Orders Details Patient Name: Date of Service: [name], [name] 01/12/2022 8:15 A M Medical Record Number: [id number] Patient Account Number: [id number] Date of Birth/Sex: Treating RN: 02-Sep-1985 (36 y.o. 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 - Dr. [name] - Room 1 with [name] Return A Wed. 10/18 @ 08:15 am Bathing/ Shower/ Hygiene May shower and wash wound with soap and water. Edema Control - Lymphedema / SCD / Other Langenfeld, Max ([id number]) [phone].pdf Page 10 of 22 Bilateral Lower Extremities Elevate legs to the level of the heart or above for 30 minutes daily and/or when sitting, a frequency of: - throughout the day Avoid standing for long periods of time. Exercise regularly Compression stocking or Garment 20-30 mm/Hg pressure to: - to both legs daily Off-Loading Open toe surgical shoe to: - Both feet Additional Orders / Instructions Follow Nutritious Diet Wound Treatment Wound #3 - Foot Wound Laterality: Plantar, Left, Lateral Cleanser: Soap and Water 1 x Per Day/30  Days Discharge Instructions: May shower and wash wound with dial antibacterial soap and water prior to dressing change. Peri-Wound Care: Zinc Oxide Ointment 30g tube 1 x Per Day/30 Days Discharge Instructions: Apply Zinc Oxide to periwound with each dressing change Peri-Wound Care: Sween Lotion (Moisturizing lotion) 1 x Per Day/30 Days Discharge Instructions: Apply moisturizing lotion as directed Prim Dressing: Hydrofera Blue Ready Foam, 4x5 in (DME) (Dispense As Written) 1 x Per Day/30 Days ary Discharge Instructions: Apply to wound bed as instructed Secondary Dressing: ABD Pad, 8x10 (Generic) 1 x Per Day/30 Days Discharge Instructions: Apply over primary dressing as directed. Secondary Dressing: Optifoam Non-Adhesive Dressing, 4x4 in (Generic) 1 x Per Day/30 Days Discharge Instructions: Apply over primary dressing as directed. Secondary Dressing: NonWoven Sponge, 4x4 (in/in) (Generic) 1 x Per Day/30 Days Secured With: 48M Medipore H Soft Cloth Surgical T ape, 4 x 10 (in/yd) (Generic) 1 x Per Day/30 Days Discharge Instructions: Secure with tape as directed. Compression Wrap: Kerlix Roll 4.5x3.1 (in/yd) (DME) (Generic) 1 x Per Day/30 Days Discharge Instructions:  Apply Kerlix and Coban compression as directed. Compression Wrap: 380M ACE Elastic Bandage With VELCRO Brand Closure, 4 (in) (DME) (Generic) 1 x Per Day/30 Days Wound #8 - Foot Wound Laterality: Right, Lateral Cleanser: Soap and Water 1 x Per Day/30 Days Discharge Instructions: May shower and wash wound with dial antibacterial soap and water prior to dressing change. Peri-Wound Care: Sween Lotion (Moisturizing lotion) 1 x Per Day/30 Days Discharge Instructions: Apply moisturizing lotion as directed Prim Dressing: Promogran Prisma Matrix, 4.34 (sq in) (silver collagen) (Generic) 1 x Per Day/30 Days ary Discharge Instructions: Moisten collagen with saline or hydrogel Secondary Dressing: ABD Pad, 8x10 (Generic) 1 x Per Day/30  Days Discharge Instructions: Apply over primary dressing as directed. Secondary Dressing: Optifoam Non-Adhesive Dressing, 4x4 in (Generic) 1 x Per Day/30 Days Discharge Instructions: Apply over primary dressing as directed. Secondary Dressing: NonWoven Sponge, 4x4 (in/in) (Generic) 1 x Per Day/30 Days Secured With: 380M Medipore H Soft Cloth Surgical T ape, 4 x 10 (in/yd) (Generic) 1 x Per Day/30 Days Discharge Instructions: Secure with tape as directed. Compression Wrap: Kerlix Roll 4.5x3.1 (in/yd) (DME) (Generic) 1 x Per Day/30 Days Discharge Instructions: Apply Kerlix and Coban compression as directed. Compression Wrap: 380M ACE Elastic Bandage With VELCRO Brand Closure, 4 (in) (DME) (Generic) 1 x Per Day/30 Days Electronic Signature(s) Signed: 01/12/2022 12:06:00 PM By: [name] MD FACS Entered By: [name] on 01/12/2022 09:37:50 Mish, Max ([id number]) [id number] Page 11 of 22 -------------------------------------------------------------------------------- Problem List Details Patient Name: Date of Service: [name] 01/12/2022 8:15 A M Medical Record Number: [id number] Patient Account Number: [id number] Date of Birth/Sex: Treating RN: 1985-05-24 (36 y.o. [name] Primary Care Provider: [name] Other Clinician: Referring Provider: Treating Provider/Extender: [name] in Treatment: 115 Active Problems ICD-10 Encounter Code Description Active Date MDM Diagnosis E11.621 Type 2 diabetes mellitus with foot ulcer 10/24/2019 No Yes L97.528 Non-pressure chronic ulcer of other part of left foot with other specified 10/24/2019 No Yes severity L97.518 Non-pressure chronic ulcer of other part of right foot with other specified 10/24/2019 No Yes severity Inactive Problems ICD-10 Code Description Active Date Inactive Date L97.518 Non-pressure chronic ulcer of other part of right foot with  other specified severity 07/14/2020 07/14/2020 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, right ankle and foot 10/24/2019 10/24/2019 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 08/10/2020 08/10/2020 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, left ankle and foot 10/24/2019 10/24/2019 L97.322 Non-pressure chronic ulcer of left ankle with fat layer exposed 09/29/2021 09/29/2021 B95.62 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 10/24/2019 10/24/2019 classified elsewhere Resolved Problems Electronic Signature(s) Signed: 01/12/2022 9:33:55 AM By: [name] MD FACS Entered By: [name] on 01/12/2022 09:33:55 [place], Max ([id number]) [id number] Page 12 of 22 -------------------------------------------------------------------------------- Progress Note Details Patient Name: Date of Service: [name] 01/12/2022 8:15 A M Medical Record Number: [id number] Patient Account Number: [id number] Date of Birth/Sex: Treating RN: 08/13/1985 (36 y.o. 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 He comes in for review of a wound over the plantar fifth metatarsal head extending into the lateral part of the foot. He was followed for this previously by his podiatrist Dr. Cornelius Moras. As the patient tells his story he went to  see podiatry first for a swelling he developed on the lateral part of his fifth metatarsal head in May. He states this was "open" by podiatry and the area closed. He was followed up in June and it was again opened callus removed and it closed promptly. There were plans being made for surgery on the fifth metatarsal head in June however his blood sugar was apparently too high for anesthesia. Apparently the area was debrided and opened again in June and it is never closed since. Looking over the records from podiatry I am really not able to follow this. It was clear when he was first seen it was before 5/14 at that point he already had a wound. By 5/17 the ulcer was resolved. I do not see anything about a procedure. On 5/28 noted to have pre-ulcerative moderate keratosis. X-ray noted 1/5 contracted toe and tailor's bunion and metatarsal deformity. On a visit date on 09/28/2018 the dorsal part of the left foot it healed and resolved. There was concern about swelling in his lower extremity he was sent to the ER.. As far as I can tell he was seen in the ER on 7/12 with an ulcer on his left foot. A DVT rule out of the left leg was negative. I do not think I have complete records from podiatry but I am not able to verify the procedures this patient states he had. He states after the last procedure the wound has never closed although I am not able to follow this in the records I have from podiatry. He has not had a recent x-ray The patient has been using Neosporin on the wound. He is wearing a Darco shoe. He is still very active up on his foot working and exercising. Past medical history; type 2 diabetes ketosis-prone, leg swelling with a negative DVT study in July. Non-smoker ABI in our clinic was 0.85 on the left 10/16; substantial wound on the plantar left fifth met head extending laterally almost to the dorsal fifth MTP. We have been using silver alginate we gave him a Darco forefoot off loader. An x-ray  did not show evidence of osteomyelitis did note soft tissue emphysema which I think was due to gas tracking through an open wound. There is no doubt in my mind he requires an MRI 10/23; MRI not booked until 3 November at the earliest this is largely due to his glucose sensor in the right arm. We have been using silver alginate. There has been an improvement 10/29; I am still not exactly sure when his MRI is booked for. He says it is the third but it is the 10th in epic. This definitely needs to be done. He is running a low-grade fever today but no other symptoms. No real improvement in the 1 02/26/2019 patient presents today for a follow-up visit here in our clinic he is last been seen in the clinic on October 29. Subsequently we were working on getting MRI to evaluate and see what exactly was going on and where we would need to go from the standpoint of whether or not he had osteomyelitis and again what treatments were going be required. Subsequently the patient ended up being admitted to the hospital on 02/07/2019 and was  discharged on 02/14/2019. This is a somewhat interesting admission with a discharge diagnosis of pneumonia due to COVID-19 although he was positive for COVID-19 when tested at the urgent care but negative x2 when he was actually in the hospital. With that being said he did have acute respiratory failure with hypoxia and it was noted he also have a left foot ulceration with osteomyelitis. With that being said he did require oxygen for his pneumonia and I level 4 L. He was placed on antivirals and steroids for the COVID-19. He was also transferred to the [facility] at one point. Nonetheless he did subsequently discharged home and since being home has done much better in that regard. The CT angiogram did not show any pulmonary embolism. With regard to the osteomyelitis the patient was placed on vancomycin and Zosyn while in the hospital but has been changed to Augmentin at  discharge. It was also recommended that he follow- up with wound care and podiatry. Podiatry however wanted him to see [name] according to the patient prior to them doing anything further. His hemoglobin A1c was 9.9 as noted in the hospital. Have an MRI of the left foot performed while in the hospital on 02/04/2019. 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 There is an overlying plantar open wound noted an abscess tracking back along the lateral aspect of the fifth metatarsal shaft. There is otherwise diffuse cellulitis and mild fasciitis without findings of polymyositis. The patient did have recently pneumonia secondary to COVID-19 I looked in the chart through epic and it does appear that the patient may need to have an additional x-ray just to ensure everything is cleared and that he has no airspace disease prior to putting him into the [name]. 03/05/2019; patient was readmitted to the clinic last week. He was hospitalized twice for a viral upper respiratory tract infection from 11/1 through 11/4 and then 11/5 through 11/12 ultimately this turned out to be Covid pneumonitis. Although he was discharged on oxygen he is not using it. He says he feels fine. He has no exercise limitation no cough no sputum. His O2 sat in our clinic today was 100% on room air. 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 He received Vanco and Zosyn in the hospital and then was discharged on 2 weeks of Augmentin. I do not see any relevant cultures. He was supposed to follow-up with infectious disease but I do not see that he has an appointment. 12/8; patient saw Dr. Novella Olive of infectious disease last week. He felt that he had had adequate antibiotic therapy. He did not go to follow-up with Dr. Amalia Hailey of podiatry and I  have again talked to him about the pros and cons of this. He does not want to consider a ray amputation of this time. He is aware of the risks of recurrence, migration etc. He started HBO today and tolerated this well. He can complete the Augmentin that I gave him last week. I have looked over the lab work that Dr. Chana Bode ordered his C-reactive protein was 3.3 and his sedimentation rate was 17. The C-reactive protein is never really been measurably that high in this patient 12/15; not much change in the wound today however he has undermining along the lateral part of the foot again more extensively than last  week. He has some rims of epithelialization. We have been using silver alginate. He is undergoing hyperbarics but did not dive today 12/18; in for his obligatory first total contact cast change. Unfortunately there was pus coming from the undermining area around his fifth metatarsal head. This was cultured but will preclude reapplication of a cast. He is seen in conjunction with HBO 12/24; patient had staph lugdunensis in the wound in the undermining area laterally last time. We put him on doxycycline which should have covered this. The wound looks better today. I am going to give him another week of doxycycline before reattempting the total contact cast 12/31; the patient is completing antibiotics. Hemorrhagic debris in the distal part of the wound with some undermining distally. He also had hyper granulation. Extensive debridement with a #5 curette. The infected area that was on the lateral part of the fifth met head is closed over. I do not think he needs any more antibiotics. Patient was seen prior to HBO. Preparations for a total contact cast were made in the cast will be placed post hyperbarics 04/11/19; once again the patient arrives today without complaint. He had been in a cast all week noted that he had heavy drainage this week. This resulted in large raised areas of macerated tissue  around the wound 1/14; wound bed looks better slightly smaller. Hydrofera Blue has been changing himself. He had a heavy drainage last week which caused a lot of maceration around the wound so I took him out of a total contact cast he says the drainage is actually better this week Kaser, Max ([id number]) [id number] Page 13 of 22 He is seen today in conjunction with HBO 1/21; returns to clinic. He was up in [place] for a day or 2 attending a funeral. He comes back in with the wound larger and with a large area of exposed bone. He had osteomyelitis and septic arthritis of the fifth left metatarsal head while he was in hospital. He received IV antibiotics in the hospital for a prolonged period of time then 3 weeks of Augmentin. Subsequently I gave him 2 weeks of doxycycline for more superficial wound infection. When I saw this last week the wound was smaller the surface of the wound looks satisfactory. 1/28; patient missed hyperbarics today. Bone biopsy I did last time showed Enterococcus faecalis and Staphylococcus lugdunensis . He has a wide area of exposed bone. We are going to use silver alginate as of today. I had another ethical discussion with the patient. 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 In this situation I think the likelihood of healing this is low. Therefore I have recommended a ray amputation and with the patient's agreement I have referred him to Dr. Doran Durand. The other issue is that his compliance with hyperbarics has been minimal because of his work schedule and given his underlying decision I am going to stop this today READMISSION 10/24/2019 MRI 09/29/2019 left foot IMPRESSION: 1. Apparent skin ulceration inferior and lateral to the 5th metatarsal base with underlying heterogeneous T2 signal and enhancement in the subcutaneous fat. Small peripherally enhancing fluid collections along the plantar and lateral  aspects of the 5th metatarsal base suspicious for abscesses. 2. Interval amputation through the mid 5th metatarsal with nonspecific low-level marrow edema and enhancement. Given the proximity to the adjacent soft tissue inflammatory changes, osteomyelitis cannot be excluded. 3. The additional bones appear unremarkable. MRI 09/29/2019 right foot IMPRESSION: 1. Soft tissue ulceration lateral to the 5th  MTP joint. There is low-level T2 hyperintensity within the 4th and 5th metatarsal heads and adjacent proximal phalanges without abnormal T1 signal or cortical destruction. These findings are nonspecific and could be seen with early marrow edema, hyperemia or early osteomyelitis. No evidence of septic joint. 2. Mild tenosynovitis and synovial enhancement associated with the extensor digitorum tendons at the level of the midfoot. 3. Diffuse low-level muscular T2 hyperintensity and enhancement, most consistent with diabetic myopathy. LEFT FOOT BONE Methicillin resistant staphylococcus aureus Staphylococcus lugdunensis MIC MIC CIPROFLOXACIN >=8 RESISTANT Resistant <=0.5 SENSI... Sensitive CLINDAMYCIN <=0.25 SENS... Sensitive >=8 RESISTANT Resistant ERYTHROMYCIN >=8 RESISTANT Resistant >=8 RESISTANT Resistant GENTAMICIN <=0.5 SENSI... Sensitive <=0.5 SENSI... Sensitive Inducible Clindamycin NEGATIVE Sensitive NEGATIVE Sensitive OXACILLIN >=4 RESISTANT Resistant 2 SENSITIVE Sensitive RIFAMPIN <=0.5 SENSI... Sensitive <=0.5 SENSI... Sensitive TETRACYCLINE <=1 SENSITIVE Sensitive <=1 SENSITIVE Sensitive TRIMETH/SULFA <=10 SENSIT Sensitive <=10 SENSIT Sensitive ... Marland Kitchen.. VANCOMYCIN 1 SENSITIVE Sensitive <=0.5 SENSI... Sensitive Right foot bone . Component 3 wk ago Specimen Description BONE Special Requests RIGHT 4 METATARSAL SAMPLE B Gram Stain NO WBC SEEN NO ORGANISMS SEEN Culture RARE METHICILLIN RESISTANT STAPHYLOCOCCUS AUREUS NO ANAEROBES ISOLATED Performed at [facility],  [place] [address], [place], [place] [address] Report Status 10/08/2019 FINAL Organism ID, Bacteria METHICILLIN RESISTANT STAPHYLOCOCCUS AUREUS Resulting Agency [facility] Susceptibility Methicillin resistant staphylococcus aureus MIC CIPROFLOXACIN >=8 RESISTANT Resistant CLINDAMYCIN <=0.25 SENS... Sensitive ERYTHROMYCIN >=8 RESISTANT Resistant GENTAMICIN <=0.5 SENSI... Sensitive Inducible Clindamycin NEGATIVE Sensitive OXACILLIN >=4 RESISTANT Resistant RIFAMPIN <=0.5 SENSI... Sensitive TETRACYCLINE <=1 SENSITIVE Sensitive TRIMETH/SULFA <=10 SENSIT Sensitive ... VANCOMYCIN 1 SENSITIVE Sensitive This is a patient we had in clinic earlier this year with a wound over his left fifth metatarsal head. 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 In any Orcutt, Max ([id number]) [id number] Page 14 of 22 case he developed recurrent osteomyelitis and elected for a left fifth ray amputation which was done by Dr. Doran Durand on 05/16/2019. He seems to have developed problems with wounds on his bilateral feet in June 2021 although he may have had problems earlier than this. He was in an urgent care with a right foot ulcer on 09/26/2019 and given a course of doxycycline. This was apparently after having trouble getting into see orthopedics. He was seen by podiatry on 09/28/2019 noted to have bilateral lower extremity ulcers including the left lateral fifth metatarsal base and the right subfifth met head. It was noted that had purulent drainage at that time. He required hospitalization from 6/20 through 7/2. This was because of worsening right foot wounds. He underwent bilateral operative incision and drainage and bone biopsies bilaterally. Culture results are listed above. He has been referred back to clinic by Dr. Jacqualyn Posey  of podiatry. He is also followed by Dr. Megan Salon who saw him yesterday. He was discharged from hospital on Zyvox Flagyl and Levaquin and yesterday changed to doxycycline Flagyl and Levaquin. His inflammatory markers on 6/26 showed a sedimentation rate of 129 and a C-reactive protein of 5. This is improved to 14 and 1.3 respectively. This would indicate improvement. ABIs in our clinic today were 1.23 on the right and 1.20 on the left 11/01/2019 on evaluation today patient appears to be doing fairly well in regard to the wounds on his feet at this point. Fortunately there is no signs of active infection at this time. No fevers, chills, nausea, vomiting, or diarrhea.  He currently is seeing infectious disease and still under their care at this point. Subsequently he also has both wounds which she has not been using collagen on as he did not receive that in his packaging he did not call us and let us know that. Apparently that just was missed on the order. Nonetheless we will get that straightened out today. 8/9-Patient returns for bilateral foot wounds, using Prisma with hydrogel moistened dressings, and the wounds appear stable. Patient using surgical shoes, avoiding much pressure or weightbearing as much as possible 8/16; patient has bilateral foot wounds. 1 on the right lateral foot proximally the other is on the left mid lateral foot. Both required debridement of callus and thick skin around the wounds. We have been using silver collagen 8/27; patient has bilateral lateral foot wounds. The area on the left substantially surrounded by callus and dry skin. This was removed from the wound edge. The underlying wound is small. The area on the right measured somewhat smaller today. 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 Unfortunately I did not update his antibiotics during today's visit. 9/10 I reviewed Dr. Hale Bogus last notes he felt  he had completed antibiotics his inflammatory markers were reasonably well controlled. He has a small wound on the lateral left foot and a tiny area on the right which is just above closed. He is using Hydrofera Blue with border foam he has bilateral surgical shoes 9/24; 2 week f/u. doing well. right foot is closed. left foot still undermined. 10/14; right foot remains closed at the fifth met head. The area over the base of the left fifth metatarsal has a small open area but considerable undermining towards the plantar foot. Thick callus skin around this suggests an adequate pressure relief. We have talked about this. He says he is going to go back into his cam boot. I suggested a total contact cast he did not seem enamored with this suggestion 10/26; left foot base of the fifth metatarsal. Same condition as last time. He has skin over the area with an open wound however the skin is not adherent. He went to see Dr. Earleen Newport who did an x-ray and culture of his foot I have not reviewed the x-ray but the patient was not told anything. He is on doxycycline 11/11; since the patient was last here he was in the emergency room on 10/30 he was concerned about swelling in the left foot. They did not do any cultures or x-rays. They changed his antibiotics to cephalexin. Previous culture showed group B strep. The cephalexin is appropriate as doxycycline has less than predictable coverage. Arrives in clinic today with swelling over this area under the wound. He also has a new wound on the right fifth metatarsal head 11/18; the patient has a difficult wound on the lateral aspect of the left fifth metatarsal head. The wound was almost ballotable last week I opened it slightly expecting to see purulence however there was just bleeding. I cultured this this was negative. X-ray unchanged. We are trying to get an MRI but I am not sure were going to be able to get this through his insurance. He also has an area on the right  lateral fifth metatarsal head this looks healthier 12/3; the patient finally got our MRI. Surprisingly this did not show osteomyelitis. I did show the soft tissue ulceration at the lateral plantar aspect of the fifth metatarsal base with a tiny residual  6 mm abscess overlying the superficial fascia I have tried to culture this area I have not been able to get this to grow anything. Nevertheless the protruding tissue looks aggravated. I suspect we should try to treat the underlying "abscess with broad-spectrum antibiotics. I am going to start him on Levaquin and Flagyl. He has much less edema in his legs and I am going to continue to wrap his legs and see him weekly 12/10. I started Levaquin and Flagyl on him last week. He just picked up the Flagyl apparently there was some delay. The worry is the wound on the left fifth metatarsal base which is substantial and worsening. His foot looks like he inverts at the ankle making this a weightbearing surface. Certainly no improvement in fact I think the measurements of this are somewhat worse. We have been using 12/17; he apparently just got the Levaquin yesterday this is 2 weeks after the fact. He has completed the Flagyl. The area over the left fifth metatarsal base still has protruding granulation tissue although it does not look quite as bad as it did some weeks ago. He has severe bilateral lymphedema although we have not been treating him for wounds on his legs this is definitely going to require compression. There was so much edema in the left I did not wish to put him in a total contact cast today. I am going to increase his compression from 3-4 layer. The area on the right lateral fifth met head actually look quite good and superficial. 12/23; patient arrived with callus on the right fifth met head and the substantial hyper granulated callused wound on the base of his fifth metatarsal. He says he is completing his Levaquin in 2 days but I do not think  that adds up with what I gave him but I will have to double check this. We are using Hydrofera Blue on both areas. My plan is to put the left leg in a cast the week after New Year's 04/06/2020; patient's wounds about the same. Right lateral fifth metatarsal head and left lateral foot over the base of the fifth metatarsal. There is undermining on the left lateral foot which I removed before application of total contact cast continuing with Hydrofera Blue new. Patient tells me he was seen by endocrinology today lab work was done [Dr. Kerr]. Also wondering whether he was referred to cardiology. I went over some lab work from previously does not have chronic renal failure certainly not nephrotic range proteinuria he does have very poorly controlled diabetes but this is not his most updated lab work. Hemoglobin A1c has been over 11 1/10; the patient had a considerable amount of leakage towards mid part of his left foot with macerated skin however the wound surface looks better the area on the right lateral fifth met head is better as well. I am going to change the dressing on the left foot under the total contact cast to silver alginate, continue with Hydrofera Blue on the right. 1/20; patient was in the total contact cast for 10 days. Considerable amount of drainage although the skin around the wound does not look too bad on the left foot. The area on the right fifth metatarsal head is closed. Our nursing staff reports large amount of drainage out of the left lateral foot wound 1/25; continues with copious amounts of drainage described by our intake staff. PCR culture I did last week showed E. coli and Enterococcus faecalis and low quantities. Multiple resistance genes  documented including extended spectrum beta lactamase, MRSA, MRSE, quinolone, tetracycline. The wound is not quite as good this week as it was 5 days ago but about the same size 2/3; continues with copious amounts of malodorous drainage per  our intake nurse. The PCR culture I did 2 weeks ago showed E. coli and low quantities of Enterococcus. There were multiple resistance genes detected. I put Neosporin on him last week although this does not seem to have helped. The wound is slightly deeper today. Offloading continues to be an issue here although with the amount of drainage she has a total contact cast is just not going to work 2/10; moderate amount of drainage. Patient reports he cannot get his stocking on over the dressing. I told him we have to do that the nurse gave him suggestions on how to make this work. The wound is on the bottom and lateral part of his left foot. Is cultured predominantly grew low amounts of Enterococcus, E. coli and anaerobes. There were multiple resistance genes detected including extended spectrum beta lactamase, quinolone, tetracycline. I could not think of an easy oral combination to address this so for now I am going to do topical antibiotics provided by [facility] I think the main agents here are vancomycin and an aminoglycoside. We have to be able to give him access to the wounds to get the topical antibiotic on 2/17; moderate amount of drainage this is unchanged. He has his Keystone topical antibiotic against the deep tissue culture organisms. He has been using this and changing the dressing daily. Silver alginate on the wound surface. 2/24; using Keystone antibiotic with silver alginate on the top. He had too much drainage for a total contact cast at one point although I think that is improving and I think in the next week or 2 it might be possible to replace a total contact cast I did not do this today. In general the wound surface looks healthy however he continues to have thick rims of skin and subcutaneous tissue around the wide area of the circumference which I debrided 06/04/2020 upon evaluation today patient appears to be doing well in regard to his wound. I do feel like he is showing signs of  improvement. There is little bit of callus and dead tissue around the edges of the wound as well as what appears to be a little bit of a sinus tract that is off to the side laterally I would perform debridement to clear that away today. 3/17; left lateral foot. The wound looks about the same as I remember. Not much depth surface looks healthy. No evidence of infection 3/25; left lateral foot. Wound surface looks about the same. Separating epithelium from the circumference. There really is no evidence of infection here however not making progress by my view 3/29; left lateral foot. Surface of the wound again looks reasonably healthy still thick skin and subcutaneous tissue around the wound margins. There is no Rieger, Max ([id number]) [id number] Page 15 of 22 evidence of infection. One of the concerns being brought up by the nurses has again the amount of drainage vis--vis continued use of a total contact cast 4/5; left lateral foot at roughly the base of the fifth metatarsal. Nice healthy looking granulated tissue with rims of epithelialization. The overall wound measurements are not any better but the tissue looks healthy. The only concern is the amount of drainage although he has no surrounding maceration with what we have been doing recently to absorb fluid and  protect his skin. He also has lymphedema. 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 He states that the kerlix/coban under the TCC rolled down and could not get it back up. He has been using an offloading scooter and has somehow hurt his right foot using this device. This happened last week. He states that the side of his right foot developed a blister and  opened. The top of his foot also has a few small open wounds he thinks is due to his socks rubbing in his shoes. He has not been using any dressings to the wound. He denies purulent drainage, fever/chills or erythema to the wounds. 4/22; patient presents for 1 week follow-up. He developed new wounds to the right foot that were evaluated at last clinic visit. He continues to have a total contact cast to the left leg and he reports no issues. He has been using silver collagen to the right foot wounds with no issues. He denies purulent drainage, fever/chills or erythema to the right foot wounds. He has no complaints today 4/25; patient presents for 1 week follow-up. He has a total contact cast of the left leg and reports no issues. He has been using silver alginate to the right foot wound. He denies purulent drainage, fever/chills or erythema to the right foot wounds. 5/2 patient presents for 1 week follow-up. T contact cast on the left. The wound which is on the base of the plantar foot at the base of the fifth metatarsal otal actually looks quite good and dimensions continue to gradually contract. HOWEVER the area on the right lateral fifth metatarsal head is much larger than what I remember from 2 weeks ago. Once more is he has significant levels of hypergranulation. Noteworthy that he had this same hyper granulated response on his wound on the left foot at one point in time. So much so that he I thought there was an underlying fluid collection. Based on this I think this just needs debridement. 5/9; the wound on the left actually continues to be gradually smaller with a healthy surface. Slight amount of drainage and maceration of the skin around but not too bad. However he has a large wound over the right fifth metatarsal head very much in the same configuration as his left foot wound was initially. I used silver nitrate to address the hyper granulated tissue no mechanical debridement 5/16; area on  the left foot did not look as healthy this week deeper thick surrounding macerated skin and subcutaneous tissue. oo The area on the right foot fifth met head was about the same oo The area on the right ankle that we identified last week is completely broken down into an open wound presumably a stocking rubbing issue 5/23; patient has been using a total contact cast to the left side. He has been using silver alginate underneath. He has also been using silver alginate to the right foot wounds. He has no complaints today. He denies any signs of infection. 5/31; the left-sided wound looks some better measure smaller surface granulation looks better. We have been using silver alginate under the total contact cast oo The large area on his right fifth met head and right dorsal foot look about the same still  using silver alginate 6/6; neither side is good as I was hoping although the surface area dimensions are better. A lot of maceration on his left and right foot around the wound edge. Area on the dorsal right foot looks better. He says he was traveling. I am not sure what does the amount of maceration around the plantar wounds may be drainage issues 6/13; in general the wound surfaces look quite good on both sides. Macerated skin and raised edges around the wound required debridement although in general especially on the left the surface area seems improved. oo The area on the right dorsal ankle is about the same I thought this would not be such a problem to close 6/20; not much change in either wound although the one on the right looks a little better. Both wounds have thick macerated edges to the skin requiring debridements. We have been using silver alginate. The area on the dorsal right ankle is still open I thought this would be closed. 6/28; patient comes in today with a marked deterioration in the right foot wound fifth met head. Wide area of exposed bone this is a drastic change from  last time. The area on the left there we have been casting is stagnant. We have been using silver alginate in both wound areas. 7/5; bone culture I did for PCR last time was positive for Pseudomonas, group B strep, Enterococcus and Staph aureus. There was no suggestion of methicillin resistance or ampicillin resistant genes. This was resistant to tetracycline however He comes into the clinic today with the area over his right plantar fifth metatarsal head which had been doing so well 2 weeks ago completely necrotic feeling bone. I do not know that this is going to be salvageable. The left foot wound is certainly no smaller but it has a better surface and is superficial. 7/8; patient called in this morning to say that his total contact cast was rubbing against his foot. He states he is doing fine overall. He denies signs of infection. 7/12; continued deterioration in the wound over the right fifth metatarsal head crumbling bone. This is not going to be salvageable. The patient agrees and wants to be referred to Dr. Doran Durand which we will attempt to arrange as soon as possible. I am going to continue him on antibiotics as long as that takes so I will renew those today. The area on the left foot which is the base of the fifth metatarsal continues to look somewhat better. Healthy looking tissue no depth no debridement is necessary here. 7/20; the patient was kindly seen by Dr. Doran Durand of orthopedics on 10/19/2020. 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 Towards this end we have taken him out of the total contact cast on the left we will put him in a wrap with Hydrofera Blue. As I understand things surgery is planned for 7/21 7/27; patient had his surgery last Thursday. 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 Apparently everything went well we did not still disturb that today The area on the left foot actually  looks quite good. He has been much less mobile which probably explains this he did not seem to do well in the total contact cast secondary to drainage and maceration I think. 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 Fortunately there is no evidence of active infection at this time. No fevers, chills, nausea, vomiting, or diarrhea. Overall I think that he is actually doing extremely well. Nonetheless I do believe that he is staying off of this more following the surgery in his right foot that is the reason the left is doing so great. 8/16; left plantar foot wound. This looks smaller than the last time I saw this he is using Hydrofera Blue. The surgical wound on the right foot is being followed by Dr. Doran Durand we did not look at this today. He has surgical shoes on both feet 8/23; left plantar foot wound not as good this week. Surrounding macerated skin and subcutaneous tissue everything looks moist and wet. I do not think he is offloading this adequately. He is using a surgical shoe Apparently the right foot surgical wound is not open although I did not check his foot 8/31; left plantar foot lateral aspect. Much improved this week. He has no maceration. Some improvement in the surface area of the wound but most impressively the depth is come in we are using silver alginate. The patient is a Product/process development scientist. He is asked that we write him a letter so he can go back to work. I have also tried to see if we can write something that will allow him to limit the amount of time that he is on his foot at work. 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 Hopefully the latter can be avoided 9/6; I believe the patient missed an appointment last week. He arrives in today with a wound looking roughly the same certainly no  better. Undermining laterally and also inferiorly. We used molecuLight today in training with the patient's permission.. We are using silver alginate Geist, Max ([id number]) [phone] Page 16 of 22 9/21 wound is measuring bigger this week although this may have to do with the aggressive circumferential debridement last week in response to the blush fluorescence on the MolecuLight. Culture I did last week showed significant MSSA and E. coli. I put him on Augmentin but he has not started it yet. We are also going to send this for compounded antibiotics at [facility]. There is no evidence of systemic infection 9/29; silver alginate. His Keystone arrived. He is completing Augmentin in 2 days. Offloading in a cam boot. Moderate drainage per our intake staff 10/5; using silver alginate. He has been using his [place]. He has completed his Augmentin. Per our intake nurse still a lot of drainage, far too much to consider a total contact cast. Wound measures about the same. He had the same undermining area that I defined last week from a roughly 11-3. I remove this today 10/12; using silver alginate he is using the [place]. He comes in for a nurse visit hence we are applying [employer] twice a week. Measuring slightly better today and less notable drainage. Extensive debridement of the wound edge last time 10/18; using topical Keystone and silver alginate and a soft cast. Wound measurements about the same. Drainage was through his soft cast. We are changing this twice a week Tuesdays and Friday 10/25; comes in with moderate drainage. Still using Keystone silver alginate and a soft cast. Wound dimensions completely the same.He has a lot of edema in the left leg he has lymphedema. Asking for [name] to consider wrapping him as he cannot get his  stocking on over the soft cast 11/2; comes in with moderate to large drainage slightly smaller in terms of width we have been using [place].  His wound looks satisfactory but not much improvement 11/4; patient presents today for obligatory cast change. Has no issues or complaints today. He denies signs of infection. 11/9; patient traveled this weekend to [place], was on the cast quite a bit. Staining of the cast with black material from his walking boot. Drainage was not quite as bad as we feared. Using silver alginate and Keystone 11/16; we do not have size for cast therefore we have been putting a soft cast on him since the change on Friday. Still a significant amount of drainage necessitating changing twice a week. We have been using the Keystone at cast changes either hard or soft as well as silver alginate Comes in the clinic with things actually looking fairly good improvement in width. He says his offloading is about the same 02/24/2021 upon evaluation today patient actually comes back in and is doing excellent in regard to his foot ulcer this is significantly smaller even compared to the last visit. The soft cast seems to have done extremely well for him which is great news. I do not see any signs of infection minimal debridement will be needed today. 11/30; left lateral foot much improved half a centimeter improvement in surface area. No evidence of infection. He seems to be doing better with the soft cast in the TCC therefore we will continue with this. He comes back in later in the week for a change with the nurses. This is due to drainage 12/6; no improvement in dimensions. Under illumination some debris on the surface we have been using silver alginate, soft cast. If there is anything optimistic here he seems to have have less drainage 12/13. Dimensions are improved both length and width and slightly in depth. Appears to be quite healthy today. Raised edges of this thick skin and callus around the edges however. He is in a soft cast were bringing him back once for a change on Friday. Drainage is better 12/20. Dimensions are  improved. He still has raised edges of thick skin and callus around the edges. We are using a soft cast 12/28; comes in today with thick callus around the wound. Using silver under alginate under a soft cast. I do not think there is much improvement in any measurement 2023 04/06/2021; patient was put in a total contact cast. Unfortunately not much change in surface area 1/10; not much different still thick callus and skin around the edge in spite of the total contact cast. This was just debrided last week we have been using the [facility] compounded antibiotic and silver alginate under a total contact cast 1/18 the patient's wound on the left side is doing nicely. smaller HOWEVER he comes in today with a wound on the right foot laterally. blister most likely serosangquenous drainage 1/24; the patient continues to do well in terms of the plantar left foot which is continued to contract using silver alginate under the total contact cast HOWEVER the right lateral foot is bigger with denuded skin around the edges. I used pickups and a #15 scalpel to remove this this looks like the remanence of a large blister. Cannot rule out infection. Culture in this area I did last week showed Staphylococcus lugdunensis few colonies. I am going to try to address this with his [employer] antibiotic that is done so well on the left having linezolid and  this should cover the staph 2/1; the patient's wound on his left foot which was the original plantar foot wound thick skin and eschar around the edges even in the total contact cast but the wound surface does not look too bad The real problem is on how his right lateral foot at roughly the base of the fifth metatarsal. The wound is completely necrotic more worrisome than that there is swelling around the edges of this. We have been using silver alginate on both wounds and Keystone on the right foot. Unfortunately I think he is going to require systemic antibiotics while we  await cultures. He did not get the x-ray done that we ordered last week [lost the prescription 2/7; disappointingly in the area on the left foot which we are treating with a total contact cast is still not closed although it is much smaller. He continues to have a lot of callus around the wound edge. -Right lateral foot culture I did last week was negative x-ray also negative for osteomyelitis. 2/15: TCC silver alginate on the left and silver alginate on the right lateral. No real improvement in either area 05/26/2021: T oday, the wounds are roughly the same size as at his previous visit, post-debridement. He continues to endorse fairly substantial drainage, particularly on the right. He has been in a total contact cast on the left. There is still some callus surrounding this lesion. On the right, the periwound skin is quite macerated, along with surrounding callus. The center of the right-sided wound also has some dark, densely adherent material, which is very difficult to remove. 06/02/2021: Today, both wounds are slightly smaller. He has been using zinc oxide ointment around the right ulcer and the degree of maceration has improved markedly. There continues to be an area of nonviable tissue in the center of the right sided ulcer. The left-sided wound, which has been in the total contact cast. Appears clean and the degree of callus around it is less than previously. 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 He had to take the stairs and both wounds have increased in size. The left foot, which has been in a total contact cast, has developed a tunnel tracking to the lateral aspect of his foot. The nonviable tissue in the center of the right-sided ulcer remains recalcitrant to debridement. There is significant undermining surrounding the entirety of the left sided wound. 06/16/2021: The elevator at school has been fixed and the patient has  been able to avoid putting as much weight on his wounds over the past week. We converted the left foot wound into a single lesion today, but despite this, the wound is actually smaller. The base is healthy with limited periwound callus. On the right, the central necrotic area is still present. He continues to be quite macerated around the right sided wound, despite applying barrier cream. This does, however, have the benefit of softening the callus to make it more easily removable. 06/23/2021: Today, the left wound is smaller. The lateral aspect that had opened up previously is now closed. The wound base has a healthy bed of granulation tissue and minimal slough. Unfortunately, on the right, the wound is larger and continues to be fairly macerated. He has also reopened the wound at his right ankle. He thinks this is due to the gait he has adopted secondary to his total contact cast and boot. 06/30/2021: T oday, both wounds are a little bit larger.  The lateral aspect on the left has remained closed. He continues to have significant periwound maceration. The culture that I took from the right sided wound grew a population of bacteria that is not covered by his current [facility] antibiotic. The center of the right- sided wound continues to appear necrotic with nonviable fat. It probes deeper today, but does not reach bone. Rumpf, Max ([id number]) [id number] Page 17 of 22 07/07/2021: The periwound maceration is a little bit less today. The right lateral foot wound has some areas that appear more viable and the necrotic center also looks a little bit better. The wound on the dorsal surface of his right foot near the ankle is contracting and the surface appears healthy. The left plantar wound surface looks healthy, but there is some new undermining on the medial portion. He did get his new Keystone antibiotic and began applying that to the right foot wound on Saturday. 07/14/2021:  The intake nurse reported substantial drainage from his wounds, but the periwound skin actually looks better than is typical for him. The wound on the dorsal surface of his right foot near the ankle is smaller and just has a small open area underneath some dried eschar. The left plantar wound surface looks healthy and there has been no significant accumulation of callus. The right lateral foot wound looks quite a bit better, with the central portion, which typically appears necrotic, looking more viable albeit pale. 07/22/2021: His left foot is extremely macerated today. The wound is about the same size. The wound on the dorsal surface of his right foot near the ankle had closed, but he traumatized it removing the dressing and there is a tiny skin tear in that location. The right lateral foot wound is bigger, but the surface appears healthy. 07/30/2021: The wound on the dorsal surface of his right foot near the ankle is closed. The right lateral foot wound again is a little bit bigger due to some undermining. The periwound skin is in better condition, however. He has been applying zinc oxide. The wound surface is a little bit dry today. On the left, he does not have the substantial maceration that we frequently see. The wound itself is smaller and has a clean surface. 08/06/2021: Both wounds seem to have deteriorated over the past week. The right lateral foot wound has a dry surface but the periwound is boggy.. Overall wound dimensions are about the same. On the left, the wound is about the same size, but there is more undermining present underneath periwound callus. 08/13/2021: The right sided wound looks about the same, but on the left there has been substantial deterioration. The undermining continues to extend under periwound callus. Once this was removed, substantial extension of the wound was present. There is no odor or purulent drainage but clearly the wounds have broken down. 08/20/2021: The wounds  look about the same today. He has been out of his total contact cast and has just been changing the dressings using topical Keystone with PolyMem Ag, Kerlix and Ace bandages. The wound on the top of his right ankle has reopened but this is quite small. There was a little bit of purulent material that I expressed when examining this wound. 08/24/2021: After the aggressive debridement I performed at his last visit, the wounds actually look a little bit better today. They are smaller with the exception of the wound on the top of his right ankle which is a little bit bigger as some more skin pulled off when  he was changing his dressing. We are using topical Keystone with PolyMem Ag Kerlix and Ace bandages. 09/02/2021: There has been really no change to any of his wounds. 09/16/2021: The patient was hospitalized last week with nausea, vomiting, and dehydration. He says that while he was in the hospital, his wounds were not really addressed properly. T oday, both plantar foot wounds are larger and the periwound skin is macerated. The wound on the dorsum of his right foot has a scab on the top. The right foot now has a crater where previously he had had nonviable fat. It looks as though this simply died and fell out. The periwound callus is wet. 09/24/2021: His wounds have deteriorated somewhat since his last visit. The wound on the dorsum of his right foot near his ankle is larger and has more nonviable tissue present. The crater in his right foot is even deeper; I cannot quite palpate or probe to bone but I am sure it is close. The wound on his left plantar foot has an odd boggy area in the center that almost feels as though it has fluid within it. He has run out of his topical Keystone antibiotic. We are using silver alginate on his wounds. 09/29/2021: He has developed a new wound on the dorsum of his left foot near his ankle. He says he thinks his wrapping is rubbing in that site. I would concur with this as  the wound on his right ankle is larger. The left foot looks about the same. The right foot has the crater that was present last week. No significant slough accumulation, but his foot remains quite swollen and warm despite oral antibiotic therapy. 10/08/2021: All of his wounds look about the same as last week. He did not start his oral antibiotics that are prescribed until just a couple of days ago; his [employer] compounded antibiotics formula has been changed and he is awaiting delivery of the new recipe. His MRI that was scheduled for earlier this week was canceled as no prior authorization had been obtained; unfortunately the tech responsible sent an email to my old [employer] email, which I no longer use nor have access to. 10/18/2021: The wounds on his bilateral dorsal feet near the ankles are both improved. They are smaller and have just some eschar and slough buildup. The left plantar wound has a fair amount of undermining, but the surface is clean. There is some periwound callus accumulation. On the right plantar foot, there is nonviable fat leading to a deep tunnel that tracks towards his dorsal medial foot. There is periwound callus and slough accumulation, as well. His right foot and leg remain swollen as compared to the left. 10/25/2021: The wounds on his bilateral dorsal feet and at the ankles have broken down somewhat. They are little bit larger than last week. The left plantar wound continues to undermine laterally but the surface is clean. The right plantar foot wound shows some decreased depth in the tunnel tracking towards his dorsal medial foot. He has not yet had the Doppler study that I ordered; it sounds like there is some confusion about the scheduling of the procedure. In addition, the MRI was denied and I have taken steps to appeal the denial. 11/24/2021: Since our last visit, Mr. Lippmann was admitted to the hospital where an MRI suggested osteomyelitis. He was taken the operating  room by podiatry. Bone biopsies were negative for osteomyelitis. They debrided his wounds and applied myriad matrix. He saw them last  week and they removed his staples. He is here today to continue his wound healing process. T oday, both of the dorsal foot/ankle wounds are substantially smaller. There is just a little eschar overlying the left sided wound and some eschar and slough on the right. The right plantar foot ulcer has the healthiest surface of granulation tissue that I have seen to date. A portion of the myriad matrix failed to take and was hanging loose. It appears that myriad morcells were placed into the tunnel closest to the dorsal portion of his foot. These have sloughed off. The left plantar foot ulcer is about the same size, but has a much healthier surface than in the past. Both plantar ulcers have callus and slough accumulation. 12/07/2021: Left dorsal foot/ankle wound is closed. The right dorsal foot/ankle wound is nearly closed and just has a small open area with some eschar and slough. The right plantar foot wound has contracted quite a bit since our last visit. It has a healthy surface with just a little bit of slough accumulation and periwound callus buildup. The left plantar foot wound is about the same size but the surface appears healthy. There is a little slough and periwound callus on this side, as well. 12/15/2021: Both dorsal foot/ankle wounds are closed. The right plantar foot wound is substantially smaller than at our last visit. The tunneling that was present has nearly closed. There is just a little bit of slough buildup. The left plantar foot wound is also a little bit smaller today. The surface is the healthiest that I have ever seen it. Light slough and periwound callus accumulation on this side. 12/22/2021: The dorsal ankle/foot wounds remain closed. The right plantar foot wound continues to contract. There is still a bit of depth at the lateral portion of the wound  but the surface has a good granulated appearance. The wound on the left is about the same size, the central indented portion is still adherent. It also is clean with good granulation tissue present. The periwound skin and callus are a bit macerated, however. 12/29/2021: Both ankle wounds remain closed. The right plantar foot wound is less than half the size that it was last week. There is no depth any longer and the surface has just a little bit of slough and periwound callus. On the left, the wound is not much smaller, but the surface is healthier with good granulation tissue. There is also a little slough and periwound callus buildup. 01/05/2022: The right plantar foot wound continues to contract and is once again about half the size as it was last week. There is just a little bit of surface slough and periwound callus. On the left, the depth of the wound has decreased and the diameter has started to contract. It is also very clean with just a little slough and biofilm present. Geppert, Max ([id number]) [id number] Page 18 of 22 01/12/2022: The right plantar foot wound is smaller again today. There is just some periwound callus and slough accumulation. On the left, there is a fair amount of undermining and the surface is a little boggy, but no other significant change. Patient History Information obtained from Patient. Family History No family history of Cancer, Diabetes, Hereditary Spherocytosis, Hypertension, Kidney Disease, Lung Disease, Seizures, Stroke, Thyroid Problems, Tuberculosis. Social History Never smoker, Marital Status - Single, Alcohol Use - Rarely, Drug Use - No History, Caffeine Use - Never. Medical History Eyes Denies history of Cataracts, Glaucoma, Optic Neuritis Ear/Nose/Mouth/Throat Denies history of Chronic  sinus problems/congestion, Middle ear problems Hematologic/Lymphatic Denies history of Anemia, Hemophilia, Human Immunodeficiency  Virus, Lymphedema, Sickle Cell Disease Respiratory Denies history of Aspiration, Asthma, Chronic Obstructive Pulmonary Disease (COPD), Pneumothorax, Sleep Apnea, Tuberculosis Cardiovascular Denies history of Angina, Arrhythmia, Congestive Heart Failure, Coronary Artery Disease, Deep Vein Thrombosis, Hypertension, Hypotension, Myocardial Infarction, Peripheral Arterial Disease, Peripheral Venous Disease, Phlebitis, Vasculitis Gastrointestinal Denies history of Cirrhosis , Colitis, Crohnoos, Hepatitis A, Hepatitis B, Hepatitis C Endocrine Patient has history of Type II Diabetes Denies history of Type I Diabetes Immunological Denies history of Lupus Erythematosus, Raynaudoos, Scleroderma Integumentary (Skin) Denies history of History of Burn Musculoskeletal Denies history of Gout, Rheumatoid Arthritis, Osteoarthritis, Osteomyelitis Neurologic Denies history of Dementia, Neuropathy, Quadriplegia, Paraplegia, Seizure Disorder Oncologic Denies history of Received Chemotherapy, Received Radiation Psychiatric Denies history of Anorexia/bulimia, Confinement Anxiety Hospitalization/Surgery History - 11/1-11/06/2018- sepsis foot infection. - 11/4-11/5 02 sats low respiratory distress. Objective Constitutional Slightly hypertensive. No acute distress.. Vitals Time Taken: 8:40 AM, Height: 77 in, Weight: 280 lbs, BMI: 33.2, Temperature: 97.5 F, Pulse: 84 bpm, Respiratory Rate: 18 breaths/min, Blood Pressure: 146/89 mmHg, Capillary Blood Glucose: 76 mg/dl. General Notes: glucose per pt report this am Respiratory Normal work of breathing on room air.. General Notes: 01/12/2022: The right plantar foot wound is smaller again today. There is just some periwound callus and slough accumulation. On the left, there is a fair amount of undermining and the surface is a little boggy, but no other significant change. Integumentary (Hair, Skin) Wound #3 status is Open. Original cause of wound was Trauma.  The date acquired was: 10/02/2019. The wound has been in treatment 115 weeks. The wound is located on the [place]. The wound measures 3.1cm length x 3.4cm width x 0.7cm depth; 8.278cm^2 area and 5.795cm^3 volume. There is Fat Layer (Subcutaneous Tissue) exposed. There is no tunneling noted, however, there is undermining starting at 3:00 and ending at 6:00 with a maximum distance of 0.5cm. There is a medium amount of serosanguineous drainage noted. The wound margin is well defined and not attached to the wound base. There is large (67-100%) red, friable, hyper - granulation within the wound bed. There is a small (1-33%) amount of necrotic tissue within the wound bed including Adherent Slough. The periwound skin appearance had no abnormalities noted for moisture. The periwound skin appearance had no abnormalities noted for color. The periwound skin appearance exhibited: Callus. Periwound temperature was noted as No Abnormality. Wound #8 status is Open. Original cause of wound was Blister. The date acquired was: 04/18/2021. The wound has been in treatment 38 weeks. The wound is located on the Right,Lateral Foot. The wound measures 1cm length x 1.4cm width x 0.1cm depth; 1.1cm^2 area and 0.11cm^3 volume. There is Fat Layer (Subcutaneous Tissue) exposed. There is no tunneling or undermining noted. There is a medium amount of serosanguineous drainage noted. The wound margin is well defined and not attached to the wound base. There is large (67-100%) red, pink granulation within the wound bed. There is a small (1-33%) amount of necrotic tissue within the wound bed including Adherent Slough. The periwound skin appearance had no abnormalities noted for moisture. The periwound skin appearance had no abnormalities noted for color. The periwound skin appearance exhibited: Callus. Periwound temperature was noted as No Abnormality. 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 There was a Selective/Open Wound Skin/Epidermis Debridement with a total area of 14 sq cm performed by [name], MD. With the following instrument(s): Curette to remove Non-Viable tissue/material. Material removed includes Callus, Skin: Epidermis, and Biofilm after achieving pain control using Lidocaine 4% Topical Solution. No specimens were taken. A time out was conducted at 08:55, prior to the start of the procedure. A Minimum amount of bleeding was controlled with Pressure. The procedure was tolerated well with a pain level of 0 throughout and a pain level of 0 following the procedure. Post Debridement Measurements: 3.1cm length x 3.4cm width x 0.7cm depth; 5.795cm^3 volume. Character of Wound/Ulcer Post Debridement is improved. Severity of Tissue Post Debridement is: Fat layer exposed. Post procedure Diagnosis Wound #3: Same as Pre-Procedure General Notes: scribed by Baruch Gouty, RN for Dr. [name]. Wound #8 Pre-procedure diagnosis of Wound #8 is a Diabetic Wound/Ulcer of the Lower Extremity located on the Right,Lateral Foot .Severity of Tissue Pre Debridement is: Fat layer exposed. There was a Selective/Open Wound Non-Viable Tissue Debridement with a total area of 1.4 sq cm performed by [name], MD. With the following instrument(s): Curette to remove Non-Viable tissue/material. Material removed includes Callus and Slough and after achieving pain control using Lidocaine 4% T opical Solution. No specimens were taken. A  time out was conducted at 08:55, prior to the start of the procedure. A Minimum amount of bleeding was controlled with Pressure. The procedure was tolerated well with a pain level of 0 throughout and a pain level of 0 following the procedure. Post Debridement Measurements: 1cm length x 1.4cm width x 0.1cm depth; 0.11cm^3 volume. Character of Wound/Ulcer Post Debridement is improved. Severity of Tissue Post Debridement is: Fat layer exposed. Post procedure Diagnosis Wound #8: Same as Pre-Procedure General Notes: scribed by Baruch Gouty, RN for Dr. [name]. Plan Follow-up Appointments: Return Appointment in 1 week. - Dr. [name] - Room 1 with Delice Lesch. 10/18 @ 08:15 am Bathing/ Shower/ Hygiene: May shower and wash wound with soap and water. Edema Control - Lymphedema / SCD / Other: Elevate legs to the level of the heart or above for 30 minutes daily and/or when sitting, a frequency of: - throughout the day Avoid standing for long periods of time. Exercise regularly Compression stocking or Garment 20-30 mm/Hg pressure to: - to both legs daily Off-Loading: Open toe surgical shoe to: - Both feet Additional Orders / Instructions: Follow Nutritious Diet WOUND #3: - Foot Wound Laterality: Plantar, Left, Lateral Cleanser: Soap and Water 1 x Per Day/30 Days Discharge Instructions: May shower and wash wound with dial antibacterial soap and water prior to dressing change. Peri-Wound Care: Zinc Oxide Ointment 30g tube 1 x Per Day/30 Days Discharge Instructions: Apply Zinc Oxide to periwound with each dressing change Peri-Wound Care: Sween Lotion (Moisturizing lotion) 1 x Per Day/30 Days Discharge Instructions: Apply moisturizing lotion as directed Prim Dressing: Hydrofera Blue Ready Foam, 4x5 in (DME) (Dispense As Written) 1 x Per Day/30 Days ary Discharge Instructions: Apply to wound bed as instructed Secondary Dressing: ABD Pad, 8x10 (Generic) 1 x Per Day/30 Days Discharge Instructions: Apply  over primary dressing as directed. Secondary Dressing: Optifoam Non-Adhesive Dressing, 4x4 in (Generic) 1 x Per Day/30 Days Discharge Instructions: Apply over primary dressing as directed. Secondary Dressing: NonWoven Sponge, 4x4 (  in/in) (Generic) 1 x Per Day/30 Days Secured With: 80M Medipore H Soft Cloth Surgical T ape, 4 x 10 (in/yd) (Generic) 1 x Per Day/30 Days Discharge Instructions: Secure with tape as directed. Com pression Wrap: Kerlix Roll 4.5x3.1 (in/yd) (DME) (Generic) 1 x Per Day/30 Days Discharge Instructions: Apply Kerlix and Coban compression as directed. Com pression Wrap: 80M ACE Elastic Bandage With VELCRO Brand Closure, 4 (in) (DME) (Generic) 1 x Per Day/30 Days WOUND #8: - Foot Wound Laterality: Right, Lateral Cleanser: Soap and Water 1 x Per Day/30 Days Discharge Instructions: May shower and wash wound with dial antibacterial soap and water prior to dressing change. Peri-Wound Care: Sween Lotion (Moisturizing lotion) 1 x Per Day/30 Days Discharge Instructions: Apply moisturizing lotion as directed Prim Dressing: Promogran Prisma Matrix, 4.34 (sq in) (silver collagen) (Generic) 1 x Per Day/30 Days ary Discharge Instructions: Moisten collagen with saline or hydrogel Secondary Dressing: ABD Pad, 8x10 (Generic) 1 x Per Day/30 Days Discharge Instructions: Apply over primary dressing as directed. Secondary Dressing: Optifoam Non-Adhesive Dressing, 4x4 in (Generic) 1 x Per Day/30 Days Discharge Instructions: Apply over primary dressing as directed. Simoni, Max ([id number]) [id number] Page 20 of 22 Secondary Dressing: NonWoven Sponge, 4x4 (in/in) (Generic) 1 x Per Day/30 Days Secured With: 80M Medipore H Soft Cloth Surgical T ape, 4 x 10 (in/yd) (Generic) 1 x Per Day/30 Days Discharge Instructions: Secure with tape as directed. Compression Wrap: Kerlix Roll 4.5x3.1 (in/yd) (DME) (Generic) 1 x Per Day/30 Days Discharge Instructions: Apply Kerlix and  Coban compression as directed. Compression Wrap: 80M ACE Elastic Bandage With VELCRO Brand Closure, 4 (in) (DME) (Generic) 1 x Per Day/30 Days 01/12/2022: The right plantar foot wound is smaller again today. There is just some periwound callus and slough accumulation. On the left, there is a fair amount of undermining and the surface is a little boggy, but no other significant change. I used a curette to debride callus and slough from the right plantar foot wound. I used a curette to debride away the skin that was creating the undermining on the left, and also took off some biofilm. I am going to change the dressing to [facility] on the left, continue Prisma silver collagen on the right. Continue aggressive offloading. Follow-up in 1 week. Electronic Signature(s) Signed: 01/12/2022 9:38:40 AM By: [name] MD FACS Entered By: [name] on 01/12/2022 09:38:40 -------------------------------------------------------------------------------- HxROS Details Patient Name: Date of Service: [name], [name] 01/12/2022 8:15 A M Medical Record Number: [id number] Patient Account Number: [id number] Date of Birth/Sex: Treating RN: Mar 31, 1986 (36 y.o. M) Primary Care Provider: [name] Other Clinician: Referring Provider: Treating Provider/Extender: [name] in Treatment: 34 Information Obtained From Patient Eyes Medical History: Negative for: Cataracts; Glaucoma; Optic Neuritis Ear/Nose/Mouth/Throat Medical History: Negative for: Chronic sinus problems/congestion; Middle ear problems Hematologic/Lymphatic Medical History: Negative for: Anemia; Hemophilia; Human Immunodeficiency Virus; Lymphedema; Sickle Cell Disease Respiratory Medical History: Negative for: Aspiration; Asthma; Chronic Obstructive Pulmonary Disease (COPD); Pneumothorax; Sleep Apnea; Tuberculosis Cardiovascular Medical History: Negative for: Angina; Arrhythmia; Congestive  Heart Failure; Coronary Artery Disease; Deep Vein Thrombosis; Hypertension; Hypotension; Myocardial Infarction; Peripheral Arterial Disease; Peripheral Venous Disease; Phlebitis; Vasculitis Gastrointestinal Medical History: Negative for: Cirrhosis ; Colitis; Crohns; Hepatitis A; Hepatitis B; Hepatitis C Endocrine Medical History: Positive for: Type II Diabetes Negative for: Type I Diabetes Bracamonte, Max ([id number]) [id number] Page 21 of 22 Time with diabetes: 8 Treated with: Insulin Blood sugar tested every day: No Immunological Medical History: Negative for: Lupus Erythematosus; Raynauds;  Scleroderma Integumentary (Skin) Medical History: Negative for: History of Burn Musculoskeletal Medical History: Negative for: Gout; Rheumatoid Arthritis; Osteoarthritis; Osteomyelitis Neurologic Medical History: Negative for: Dementia; Neuropathy; Quadriplegia; Paraplegia; Seizure Disorder Oncologic Medical History: Negative for: Received Chemotherapy; Received Radiation Psychiatric Medical History: Negative for: Anorexia/bulimia; Confinement Anxiety Immunizations Pneumococcal Vaccine: Received Pneumococcal Vaccination: No Implantable Devices None Hospitalization / Surgery History Type of Hospitalization/Surgery 11/1-11/06/2018- sepsis foot infection 11/4-11/5 02 sats low respiratory distress Family and Social History Cancer: No; Diabetes: No; Hereditary Spherocytosis: No; Hypertension: No; Kidney Disease: No; Lung Disease: No; Seizures: No; Stroke: No; Thyroid Problems: No; Tuberculosis: No; Never smoker; Marital Status - Single; Alcohol Use: Rarely; Drug Use: No History; Caffeine Use: Never; Financial Concerns: No; Food, Clothing or Shelter Needs: No; Support System Lacking: No; Transportation Concerns: No Electronic Signature(s) Signed: 01/12/2022 12:06:00 PM By: [name] MD FACS Entered By: [name] on 01/12/2022  09:37:15 -------------------------------------------------------------------------------- SuperBill Details Patient Name: Date of Service: [name], [name] 01/12/2022 Medical Record Number: [id number] Patient Account Number: [id number] Date of Birth/Sex: Treating RN: 1985/06/22 (36 y.o. M) Primary Care Provider: [name] Other Clinician: Referring Provider: Treating Provider/Extender: [name] in Treatment: [address] Diagnosis Coding La Valle, Max ([id number]) [id number] Page 22 of 22 ICD-10 Codes Code Description E11.621 Type 2 diabetes mellitus with foot ulcer L97.528 Non-pressure chronic ulcer of other part of left foot with other specified severity L97.518 Non-pressure chronic ulcer of other part of right foot with other specified severity Facility Procedures : CPT4 Code: [id number] Description: 20802 - DEBRIDE WOUND 1ST 20 SQ CM OR < ICD-10 Diagnosis Description L97.528 Non-pressure chronic ulcer of other part of left foot with other specified severi L97.518 Non-pressure chronic ulcer of other part of right foot with other  specified sever Modifier: ty ity Quantity: 1 Physician Procedures : CPT4 Code Description Modifier [id number] - WC PHYS LEVEL 3 - EST PT 25 ICD-10 Diagnosis Description L97.528 Non-pressure chronic ulcer of other part of left foot with other specified severity L97.518 Non-pressure chronic ulcer of other part of  right foot with other specified severity E11.621 Type 2 diabetes mellitus with foot ulcer Quantity: 1 : [id number] - WC PHYS DEBR WO ANESTH 20 SQ CM ICD-10 Diagnosis Description L97.528 Non-pressure chronic ulcer of other part of left foot with other specified severity L97.518 Non-pressure chronic ulcer of other part of right foot with other specified  severity Quantity: 1 Electronic Signature(s) Signed: 01/12/2022 9:39:01 AM By: [name] MD FACS Entered By: [name] on  01/12/2022 09:39:00

## 2022-01-19 ENCOUNTER — Encounter (HOSPITAL_BASED_OUTPATIENT_CLINIC_OR_DEPARTMENT_OTHER): Payer: BC Managed Care – PPO | Admitting: General Surgery

## 2022-01-19 DIAGNOSIS — E11621 Type 2 diabetes mellitus with foot ulcer: Secondary | ICD-10-CM | POA: Diagnosis not present

## 2022-01-20 NOTE — Progress Notes (Signed)
[name] [name] ([id number]) [id number] Page 1 of 22 Visit Report for 01/19/2022 Chief Complaint Document Details Patient Name: Date of Service: [name] [name] 01/19/2022 8:15 A M Medical Record Number: [id number] Patient Account Number: [id number] Date of Birth/Sex: Treating RN: [birth year] (36 y.o. M) Primary Care Provider: [name] Other Clinician: Referring Provider: Treating Provider/Extender: [name] in Treatment: Rhine from: Patient Chief Complaint 01/11/2019; patient is here for review of a rather substantial wound over the left fifth plantar metatarsal head extending into the lateral part of his foot 10/24/2019; patient returns to clinic with wounds on his bilateral feet with underlying osteomyelitis biopsy-proven Electronic Signature(s) Signed: 01/19/2022 9:44:37 AM By: [name] MD FACS Entered By: [name] on 01/19/2022 09:44:36 -------------------------------------------------------------------------------- Debridement Details Patient Name: Date of Service: [name] [name], [name] [name] 01/19/2022 8:15 A M Medical Record Number: [id number] Patient Account Number: [id number] Date of Birth/Sex: Treating RN: [birth year] (36 y.o. [name] Primary Care Provider: [name] Other Clinician: Referring Provider: Treating Provider/Extender: [name] in Treatment: 116 Debridement Performed for Assessment: Wound #8 Right,Lateral Foot Performed By: Physician [name], MD Debridement Type: Debridement Severity of Tissue Pre Debridement: Fat layer exposed Level of Consciousness (Pre-procedure): Awake and Alert Pre-procedure Verification/Time Out Yes - 09:20 Taken: Start Time: 09:20 Pain Control: Lidocaine 4% T opical Solution T Area Debrided (L x W): otal 0.7 (cm) x 1 (cm) = 0.7 (cm) Tissue and other material debrided: Non-Viable, Callus,  Slough, Skin: Epidermis, Slough Level: Skin/Epidermis Debridement Description: Selective/Open Wound Instrument: Curette Bleeding: Minimum Hemostasis Achieved: Pressure Procedural Pain: 0 Post Procedural Pain: 0 Response to Treatment: Procedure was tolerated well Level of Consciousness (Post- Awake and Alert procedure): Post Debridement Measurements of Total Wound Length: (cm) 0.7 Width: (cm) 1 Depth: (cm) 0.1 Volume: (cm) 0.055 Character of Wound/Ulcer Post Debridement: Improved Severity of Tissue Post Debridement: Fat layer exposed [name] [name] ([id number]) [id number] Page 2 of 22 Post Procedure Diagnosis Same as Pre-procedure Notes scribed by Baruch Gouty, RN for Dr. [name] Electronic Signature(s) Signed: 01/19/2022 12:35:35 PM By: [name] MD FACS Signed: 01/19/2022 5:44:27 PM By: Baruch Gouty RN, BSN Entered By: Baruch Gouty on 01/19/2022 09:23:10 -------------------------------------------------------------------------------- Debridement Details Patient Name: Date of Service: [name] [name], [name] [name] 01/19/2022 8:15 A M Medical Record Number: [id number] Patient Account Number: [id number] Date of Birth/Sex: Treating RN: [birth year] (36 y.o. [name] Primary Care Provider: [name] Other Clinician: Referring Provider: Treating Provider/Extender: [name] in Treatment: 116 Debridement Performed for Assessment: Wound #3 Left,Lateral,Plantar Foot Performed By: Physician [name], MD Debridement Type: Debridement Severity of Tissue Pre Debridement: Fat layer exposed Level of Consciousness (Pre-procedure): Awake and Alert Pre-procedure Verification/Time Out Yes - 09:20 Taken: Start Time: 09:20 T Area Debrided (L x W): otal 3 (cm) x 3.6 (cm) = 10.8 (cm) Tissue and other material debrided: Non-Viable, Callus, Slough, Skin: Epidermis, Slough Level: Skin/Epidermis Debridement  Description: Selective/Open Wound Instrument: Curette Bleeding: Minimum Hemostasis Achieved: Pressure Procedural Pain: 0 Post Procedural Pain: 0 Response to Treatment: Procedure was tolerated well Level of Consciousness (Post- Awake and Alert procedure): Post Debridement Measurements of Total Wound Length: (cm) 3 Width: (cm) 3.6 Depth: (cm) 0.4 Volume: (cm) 3.393 Character of Wound/Ulcer Post Debridement: Improved Severity of Tissue Post Debridement: Fat layer exposed Post Procedure Diagnosis Same as Pre-procedure Notes scribed by Baruch Gouty, RN for Dr. [name] Electronic Signature(s) Signed: 01/19/2022 12:35:35 PM By: [name] MD FACS Signed: 01/19/2022 5:44:27 PM  By: Baruch Gouty RN, BSN Entered By: Baruch Gouty on 01/19/2022 09:23:21 [name] [name] ([id number]) [id number] Page 3 of 22 -------------------------------------------------------------------------------- HPI Details Patient Name: Date of Service: [name] [name] 01/19/2022 8:15 A M Medical Record Number: [id number] Patient Account Number: [id number] Date of Birth/Sex: Treating RN: [birth year] (36 y.o. 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 He comes in for review of a wound over the plantar fifth metatarsal head extending into the lateral part of the foot. He was followed for this previously by his podiatrist Dr. Cornelius Moras. As the patient tells his story he went to see podiatry first for a swelling he developed on the lateral part of his fifth metatarsal head in May. He states this was "open" by podiatry and the area closed. He was followed up in June and it was again opened callus removed and it closed  promptly. There were plans being made for surgery on the fifth metatarsal head in June however his blood sugar was apparently too high for anesthesia. Apparently the area was debrided and opened again in June and it is never closed since. Looking over the records from podiatry I am really not able to follow this. It was clear when he was first seen it was before 5/14 at that point he already had a wound. By 5/17 the ulcer was resolved. I do not see anything about a procedure. On 5/28 noted to have pre-ulcerative moderate keratosis. X-ray noted 1/5 contracted toe and tailor's bunion and metatarsal deformity. On a visit date on 09/28/2018 the dorsal part of the left foot it healed and resolved. There was concern about swelling in his lower extremity he was sent to the ER.. As far as I can tell he was seen in the ER on 7/12 with an ulcer on his left foot. A DVT rule out of the left leg was negative. I do not think I have complete records from podiatry but I am not able to verify the procedures this patient states he had. He states after the last procedure the wound has never closed although I am not able to follow this in the records I have from podiatry. He has not had a recent x-ray The patient has been using Neosporin on the wound. He is wearing a Darco shoe. He is still very active up on his foot working and exercising. Past medical history; type 2 diabetes ketosis-prone, leg swelling with a negative DVT study in July. Non-smoker ABI in our clinic was 0.85 on the left 10/16; substantial wound on the plantar left fifth met head extending laterally almost to the dorsal fifth MTP. We have been using silver alginate we gave him a Darco forefoot off loader. An x-ray did not show evidence of osteomyelitis did note soft tissue emphysema which I think was due to gas tracking through an open wound. There is no doubt in my mind he requires an MRI 10/23; MRI not booked until 3 November at the earliest this is  largely due to his glucose sensor in the right arm. We have been using silver alginate. There has been an improvement 10/29; I am still not exactly sure when his MRI is booked for. He says it is the third but it is the 10th in epic. This definitely needs to be done.  He is running a low-grade fever today but no other symptoms. No real improvement in the 1 02/26/2019 patient presents today for a follow-up visit here in our clinic he is last been seen in the clinic on October 29. Subsequently we were working on getting MRI to evaluate and see what exactly was going on and where we would need to go from the standpoint of whether or not he had osteomyelitis and again what treatments were going be required. Subsequently the patient ended up being admitted to the hospital on 02/07/2019 and was discharged on 02/14/2019. This is a somewhat interesting admission with a discharge diagnosis of pneumonia due to COVID-19 although he was positive for COVID-19 when tested at the urgent care but negative x2 when he was actually in the hospital. With that being said he did have acute respiratory failure with hypoxia and it was noted he also have a left foot ulceration with osteomyelitis. With that being said he did require oxygen for his pneumonia and I level 4 L. He was placed on antivirals and steroids for the COVID-19. He was also transferred to the [facility] at one point. Nonetheless he did subsequently discharged home and since being home has done much better in that regard. The CT angiogram did not show any pulmonary embolism. With regard to the osteomyelitis the patient was placed on vancomycin and Zosyn while in the hospital but has been changed to Augmentin at discharge. It was also recommended that he follow- up with wound care and podiatry. Podiatry however wanted him to see [name] according to the patient prior to them doing anything further. His hemoglobin A1c was 9.9 as noted in the hospital. Have an  MRI of the left foot performed while in the hospital on 02/04/2019. 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 There is an overlying plantar open wound noted an abscess tracking back along the lateral aspect of the fifth metatarsal shaft. There is otherwise diffuse cellulitis and mild fasciitis without findings of polymyositis. The patient did have recently pneumonia secondary to COVID-19 I looked in the chart through epic and it does appear that the patient may need to have an additional x-ray just to ensure everything is cleared and that he has no airspace disease prior to putting him into the [name]. 03/05/2019; patient was readmitted to the clinic last week. He was hospitalized twice for a viral upper respiratory tract infection from 11/1 through 11/4 and then 11/5 through 11/12 ultimately this turned out to be Covid pneumonitis. Although he was discharged on oxygen he is not using it. He says he feels fine. He has no exercise limitation no cough no sputum. His O2 sat in our clinic today was 100% on room air. 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 He received Vanco and Zosyn in the hospital and then was discharged on 2 weeks of Augmentin. I do not see any relevant cultures. He was supposed to follow-up with infectious disease but I do not see that he has an appointment. 12/8; patient saw Dr. Novella Olive of infectious disease last week. He felt that he had had adequate antibiotic therapy. He did not go to follow-up with Dr. Amalia Hailey of podiatry and I have again talked to him about the pros and cons of this. He does not want to consider  a ray amputation of this time. He is aware of the risks of recurrence, migration etc. He started HBO today and tolerated this well. He can complete the Augmentin  that I gave him last week. I have looked over the lab work that Dr. Chana Bode ordered his C-reactive protein was 3.3 and his sedimentation rate was 17. The C-reactive protein is never really been measurably that high in this patient 12/15; not much change in the wound today however he has undermining along the lateral part of the foot again more extensively than last week. He has some rims of epithelialization. We have been using silver alginate. He is undergoing hyperbarics but did not dive today 12/18; in for his obligatory first total contact cast change. Unfortunately there was pus coming from the undermining area around his fifth metatarsal head. This was cultured but will preclude reapplication of a cast. He is seen in conjunction with HBO 12/24; patient had staph lugdunensis in the wound in the undermining area laterally last time. We put him on doxycycline which should have covered this. The wound looks better today. I am going to give him another week of doxycycline before reattempting the total contact cast 12/31; the patient is completing antibiotics. Hemorrhagic debris in the distal part of the wound with some undermining distally. He also had hyper granulation. Extensive debridement with a #5 curette. The infected area that was on the lateral part of the fifth met head is closed over. I do not think he needs any more antibiotics. Patient was seen prior to HBO. Preparations for a total contact cast were made in the cast will be placed post hyperbarics 04/11/19; once again the patient arrives today without complaint. He had been in a cast all week noted that he had heavy drainage this week. This resulted in large raised areas of macerated tissue around the wound 1/14; wound bed looks better slightly smaller. Hydrofera Blue has been changing himself. He had a heavy drainage last week which caused a lot of maceration [name] [name] ([id number]) [phone].pdf Page 4 of  22 around the wound so I took him out of a total contact cast he says the drainage is actually better this week He is seen today in conjunction with HBO 1/21; returns to clinic. He was up in [place] for a day or 2 attending a funeral. He comes back in with the wound larger and with a large area of exposed bone. He had osteomyelitis and septic arthritis of the fifth left metatarsal head while he was in hospital. He received IV antibiotics in the hospital for a prolonged period of time then 3 weeks of Augmentin. Subsequently I gave him 2 weeks of doxycycline for more superficial wound infection. When I saw this last week the wound was smaller the surface of the wound looks satisfactory. 1/28; patient missed hyperbarics today. Bone biopsy I did last time showed Enterococcus faecalis and Staphylococcus lugdunensis . He has a wide area of exposed bone. We are going to use silver alginate as of today. I had another ethical discussion with the patient. 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 In this situation I think the likelihood of healing this is low. Therefore I have recommended a ray amputation and with the patient's agreement I have referred him to Dr. Doran Durand. The other issue is that his compliance with hyperbarics has been minimal because of his work schedule and given his underlying decision I am going to stop this today READMISSION 10/24/2019 MRI  09/29/2019 left foot IMPRESSION: 1. Apparent skin ulceration inferior and lateral to the 5th metatarsal base with underlying heterogeneous T2 signal and enhancement in the subcutaneous fat. Small peripherally enhancing fluid collections along the plantar and lateral aspects of the 5th metatarsal base suspicious for abscesses. 2. Interval amputation through the mid 5th metatarsal with nonspecific low-level marrow edema and enhancement. Given the proximity to the adjacent soft tissue inflammatory  changes, osteomyelitis cannot be excluded. 3. The additional bones appear unremarkable. MRI 09/29/2019 right foot IMPRESSION: 1. Soft tissue ulceration lateral to the 5th MTP joint. There is low-level T2 hyperintensity within the 4th and 5th metatarsal heads and adjacent proximal phalanges without abnormal T1 signal or cortical destruction. These findings are nonspecific and could be seen with early marrow edema, hyperemia or early osteomyelitis. No evidence of septic joint. 2. Mild tenosynovitis and synovial enhancement associated with the extensor digitorum tendons at the level of the midfoot. 3. Diffuse low-level muscular T2 hyperintensity and enhancement, most consistent with diabetic myopathy. LEFT FOOT BONE Methicillin resistant staphylococcus aureus Staphylococcus lugdunensis MIC MIC CIPROFLOXACIN >=8 RESISTANT Resistant <=0.5 SENSI... Sensitive CLINDAMYCIN <=0.25 SENS... Sensitive >=8 RESISTANT Resistant ERYTHROMYCIN >=8 RESISTANT Resistant >=8 RESISTANT Resistant GENTAMICIN <=0.5 SENSI... Sensitive <=0.5 SENSI... Sensitive Inducible Clindamycin NEGATIVE Sensitive NEGATIVE Sensitive OXACILLIN >=4 RESISTANT Resistant 2 SENSITIVE Sensitive RIFAMPIN <=0.5 SENSI... Sensitive <=0.5 SENSI... Sensitive TETRACYCLINE <=1 SENSITIVE Sensitive <=1 SENSITIVE Sensitive TRIMETH/SULFA <=10 SENSIT Sensitive <=10 SENSIT Sensitive ... Marland Kitchen.. VANCOMYCIN 1 SENSITIVE Sensitive <=0.5 SENSI... Sensitive Right foot bone . Component 3 wk ago Specimen Description BONE Special Requests RIGHT 4 METATARSAL SAMPLE B Gram Stain NO WBC SEEN NO ORGANISMS SEEN Culture RARE METHICILLIN RESISTANT STAPHYLOCOCCUS AUREUS NO ANAEROBES ISOLATED Performed at [facility], [place] [address], [place], [place] [address] Report Status 10/08/2019 FINAL Organism ID, Bacteria METHICILLIN RESISTANT STAPHYLOCOCCUS AUREUS Resulting Agency [facility] Susceptibility Methicillin resistant staphylococcus  aureus MIC CIPROFLOXACIN >=8 RESISTANT Resistant CLINDAMYCIN <=0.25 SENS... Sensitive ERYTHROMYCIN >=8 RESISTANT Resistant GENTAMICIN <=0.5 SENSI... Sensitive Inducible Clindamycin NEGATIVE Sensitive OXACILLIN >=4 RESISTANT Resistant RIFAMPIN <=0.5 SENSI... Sensitive TETRACYCLINE <=1 SENSITIVE Sensitive TRIMETH/SULFA <=10 SENSIT Sensitive ... VANCOMYCIN 1 SENSITIVE Sensitive This is a patient we had in clinic earlier this year with a wound over his left fifth metatarsal head. 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 Doran Durand on 05/16/2019. He seems to have developed problems with wounds on his bilateral feet in June 2021 although he may have had problems earlier than this. He was in an urgent care with a right foot ulcer on 09/26/2019 and given a course of doxycycline. This was apparently after having trouble getting into see orthopedics. He was seen by podiatry on 09/28/2019 noted to have bilateral lower extremity ulcers including the left lateral fifth metatarsal base and the right subfifth met head. It was noted that had purulent drainage at that time. He required hospitalization from 6/20 through 7/2. This was because of worsening right foot wounds. He underwent bilateral operative incision and drainage and bone biopsies bilaterally. Culture results are listed above. He has been referred back to clinic by Dr. Jacqualyn Posey of podiatry. He is also followed by Dr. Megan Salon who saw him yesterday. He was discharged from hospital on Zyvox Flagyl  and Levaquin and yesterday changed to doxycycline Flagyl and Levaquin. His inflammatory markers on  6/26 showed a sedimentation rate of 129 and a C-reactive protein of 5. This is improved to 14 and 1.3 respectively. This would indicate improvement. ABIs in our clinic today were 1.23 on the right and 1.20 on the left 11/01/2019 on evaluation today patient appears to be doing fairly well in regard to the wounds on his feet at this point. Fortunately there is no signs of active infection at this time. No fevers, chills, nausea, vomiting, or diarrhea. He currently is seeing infectious disease and still under their care at this point. Subsequently he also has both wounds which she has not been using collagen on as he did not receive that in his packaging he did not call us and let us know that. Apparently that just was missed on the order. Nonetheless we will get that straightened out today. 8/9-Patient returns for bilateral foot wounds, using Prisma with hydrogel moistened dressings, and the wounds appear stable. Patient using surgical shoes, avoiding much pressure or weightbearing as much as possible 8/16; patient has bilateral foot wounds. 1 on the right lateral foot proximally the other is on the left mid lateral foot. Both required debridement of callus and thick skin around the wounds. We have been using silver collagen 8/27; patient has bilateral lateral foot wounds. The area on the left substantially surrounded by callus and dry skin. This was removed from the wound edge. The underlying wound is small. The area on the right measured somewhat smaller today. 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 Unfortunately I did not update his antibiotics during today's visit. 9/10 I reviewed Dr. Hale Bogus last notes he felt he had completed antibiotics his inflammatory markers were reasonably well controlled. He has a small wound on the lateral left foot and a tiny area on the right which is just above closed. He is using Hydrofera Blue  with border foam he has bilateral surgical shoes 9/24; 2 week f/u. doing well. right foot is closed. left foot still undermined. 10/14; right foot remains closed at the fifth met head. The area over the base of the left fifth metatarsal has a small open area but considerable undermining towards the plantar foot. Thick callus skin around this suggests an adequate pressure relief. We have talked about this. He says he is going to go back into his cam boot. I suggested a total contact cast he did not seem enamored with this suggestion 10/26; left foot base of the fifth metatarsal. Same condition as last time. He has skin over the area with an open wound however the skin is not adherent. He went to see Dr. Earleen Newport who did an x-ray and culture of his foot I have not reviewed the x-ray but the patient was not told anything. He is on doxycycline 11/11; since the patient was last here he was in the emergency room on 10/30 he was concerned about swelling in the left foot. They did not do any cultures or x-rays. They changed his antibiotics to cephalexin. Previous culture showed group B strep. The cephalexin is appropriate as doxycycline has less than predictable coverage. Arrives in clinic today with swelling over this area under the wound. He also has a new wound on the right fifth metatarsal head 11/18; the patient has a difficult wound on the lateral aspect of the left fifth metatarsal head. The wound was almost ballotable last  week I opened it slightly expecting to see purulence however there was just bleeding. I cultured this this was negative. X-ray unchanged. We are trying to get an MRI but I am not sure were going to be able to get this through his insurance. He also has an area on the right lateral fifth metatarsal head this looks healthier 12/3; the patient finally got our MRI. Surprisingly this did not show osteomyelitis. I did show the soft tissue ulceration at the lateral plantar aspect of the  fifth metatarsal base with a tiny residual 6 mm abscess overlying the superficial fascia I have tried to culture this area I have not been able to get this to grow anything. Nevertheless the protruding tissue looks aggravated. I suspect we should try to treat the underlying "abscess with broad-spectrum antibiotics. I am going to start him on Levaquin and Flagyl. He has much less edema in his legs and I am going to continue to wrap his legs and see him weekly 12/10. I started Levaquin and Flagyl on him last week. He just picked up the Flagyl apparently there was some delay. The worry is the wound on the left fifth metatarsal base which is substantial and worsening. His foot looks like he inverts at the ankle making this a weightbearing surface. Certainly no improvement in fact I think the measurements of this are somewhat worse. We have been using 12/17; he apparently just got the Levaquin yesterday this is 2 weeks after the fact. He has completed the Flagyl. The area over the left fifth metatarsal base still has protruding granulation tissue although it does not look quite as bad as it did some weeks ago. He has severe bilateral lymphedema although we have not been treating him for wounds on his legs this is definitely going to require compression. There was so much edema in the left I did not wish to put him in a total contact cast today. I am going to increase his compression from 3-4 layer. The area on the right lateral fifth met head actually look quite good and superficial. 12/23; patient arrived with callus on the right fifth met head and the substantial hyper granulated callused wound on the base of his fifth metatarsal. He says he is completing his Levaquin in 2 days but I do not think that adds up with what I gave him but I will have to double check this. We are using Hydrofera Blue on both areas. My plan is to put the left leg in a cast the week after New Year's 04/06/2020; patient's wounds  about the same. Right lateral fifth metatarsal head and left lateral foot over the base of the fifth metatarsal. There is undermining on the left lateral foot which I removed before application of total contact cast continuing with Hydrofera Blue new. Patient tells me he was seen by endocrinology today lab work was done [Dr. Kerr]. Also wondering whether he was referred to cardiology. I went over some lab work from previously does not have chronic renal failure certainly not nephrotic range proteinuria he does have very poorly controlled diabetes but this is not his most updated lab work. Hemoglobin A1c has been over 11 1/10; the patient had a considerable amount of leakage towards mid part of his left foot with macerated skin however the wound surface looks better the area on the right lateral fifth met head is better as well. I am going to change the dressing on the left foot under the total contact cast  to silver alginate, continue with Hydrofera Blue on the right. 1/20; patient was in the total contact cast for 10 days. Considerable amount of drainage although the skin around the wound does not look too bad on the left foot. The area on the right fifth metatarsal head is closed. Our nursing staff reports large amount of drainage out of the left lateral foot wound 1/25; continues with copious amounts of drainage described by our intake staff. PCR culture I did last week showed E. coli and Enterococcus faecalis and low quantities. Multiple resistance genes documented including extended spectrum beta lactamase, MRSA, MRSE, quinolone, tetracycline. The wound is not quite as good this week as it was 5 days ago but about the same size 2/3; continues with copious amounts of malodorous drainage per our intake nurse. The PCR culture I did 2 weeks ago showed E. coli and low quantities of Enterococcus. There were multiple resistance genes detected. I put Neosporin on him last week although this does not seem  to have helped. The wound is slightly deeper today. Offloading continues to be an issue here although with the amount of drainage she has a total contact cast is just not going to work 2/10; moderate amount of drainage. Patient reports he cannot get his stocking on over the dressing. I told him we have to do that the nurse gave him suggestions on how to make this work. The wound is on the bottom and lateral part of his left foot. Is cultured predominantly grew low amounts of Enterococcus, E. coli and anaerobes. There were multiple resistance genes detected including extended spectrum beta lactamase, quinolone, tetracycline. I could not think of an easy oral combination to address this so for now I am going to do topical antibiotics provided by [facility] I think the main agents here are vancomycin and an aminoglycoside. We have to be able to give him access to the wounds to get the topical antibiotic on 2/17; moderate amount of drainage this is unchanged. He has his Keystone topical antibiotic against the deep tissue culture organisms. He has been using this and changing the dressing daily. Silver alginate on the wound surface. 2/24; using Keystone antibiotic with silver alginate on the top. He had too much drainage for a total contact cast at one point although I think that is improving and I think in the next week or 2 it might be possible to replace a total contact cast I did not do this today. In general the wound surface looks healthy however he continues to have thick rims of skin and subcutaneous tissue around the wide area of the circumference which I debrided 06/04/2020 upon evaluation today patient appears to be doing well in regard to his wound. I do feel like he is showing signs of improvement. There is little bit of callus and dead tissue around the edges of the wound as well as what appears to be a little bit of a sinus tract that is off to the side laterally I would perform debridement to  clear that away today. 3/17; left lateral foot. The wound looks about the same as I remember. Not much depth surface looks healthy. No evidence of infection 3/25; left lateral foot. Wound surface looks about the same. Separating epithelium from the circumference. There really is no evidence of infection here however not making progress by my view [place] [name] [name] ([id number]) [id number] Page 6 of 22 3/29; left lateral foot. Surface of the wound again looks reasonably healthy still thick skin and  subcutaneous tissue around the wound margins. There is no evidence of infection. One of the concerns being brought up by the nurses has again the amount of drainage vis--vis continued use of a total contact cast 4/5; left lateral foot at roughly the base of the fifth metatarsal. Nice healthy looking granulated tissue with rims of epithelialization. The overall wound measurements are not any better but the tissue looks healthy. The only concern is the amount of drainage although he has no surrounding maceration with what we have been doing recently to absorb fluid and protect his skin. He also has lymphedema. 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 He states that the kerlix/coban under the TCC rolled down and could not get it back up. He has been using an offloading scooter and has somehow hurt his right foot using this device. This happened last week. He states that the side of his right foot developed a blister and opened. The top of his foot also has a few small open wounds he thinks is due to his socks rubbing in his shoes. He has not been using any dressings to the wound. He denies purulent drainage, fever/chills or erythema to the  wounds. 4/22; patient presents for 1 week follow-up. He developed new wounds to the right foot that were evaluated at last clinic visit. He continues to have a total contact cast to the left leg and he reports no issues. He has been using silver collagen to the right foot wounds with no issues. He denies purulent drainage, fever/chills or erythema to the right foot wounds. He has no complaints today 4/25; patient presents for 1 week follow-up. He has a total contact cast of the left leg and reports no issues. He has been using silver alginate to the right foot wound. He denies purulent drainage, fever/chills or erythema to the right foot wounds. 5/2 patient presents for 1 week follow-up. T contact cast on the left. The wound which is on the base of the plantar foot at the base of the fifth metatarsal otal actually looks quite good and dimensions continue to gradually contract. HOWEVER the area on the right lateral fifth metatarsal head is much larger than what I remember from 2 weeks ago. Once more is he has significant levels of hypergranulation. Noteworthy that he had this same hyper granulated response on his wound on the left foot at one point in time. So much so that he I thought there was an underlying fluid collection. Based on this I think this just needs debridement. 5/9; the wound on the left actually continues to be gradually smaller with a healthy surface. Slight amount of drainage and maceration of the skin around but not too bad. However he has a large wound over the right fifth metatarsal head very much in the same configuration as his left foot wound was initially. I used silver nitrate to address the hyper granulated tissue no mechanical debridement 5/16; area on the left foot did not look as healthy this week deeper thick surrounding macerated skin and subcutaneous tissue. The area on the right foot fifth met head was about the same The area on the right ankle that we identified  last week is completely broken down into an  open wound presumably a stocking rubbing issue 5/23; patient has been using a total contact cast to the left side. He has been using silver alginate underneath. He has also been using silver alginate to the right foot wounds. He has no complaints today. He denies any signs of infection. 5/31; the left-sided wound looks some better measure smaller surface granulation looks better. We have been using silver alginate under the total contact cast The large area on his right fifth met head and right dorsal foot look about the same still using silver alginate 6/6; neither side is good as I was hoping although the surface area dimensions are better. A lot of maceration on his left and right foot around the wound edge. Area on the dorsal right foot looks better. He says he was traveling. I am not sure what does the amount of maceration around the plantar wounds may be drainage issues 6/13; in general the wound surfaces look quite good on both sides. Macerated skin and raised edges around the wound required debridement although in general especially on the left the surface area seems improved. The area on the right dorsal ankle is about the same I thought this would not be such a problem to close 6/20; not much change in either wound although the one on the right looks a little better. Both wounds have thick macerated edges to the skin requiring debridements. We have been using silver alginate. The area on the dorsal right ankle is still open I thought this would be closed. 6/28; patient comes in today with a marked deterioration in the right foot wound fifth met head. Wide area of exposed bone this is a drastic change from last time. The area on the left there we have been casting is stagnant. We have been using silver alginate in both wound areas. 7/5; bone culture I did for PCR last time was positive for Pseudomonas, group B strep, Enterococcus and Staph aureus.  There was no suggestion of methicillin resistance or ampicillin resistant genes. This was resistant to tetracycline however He comes into the clinic today with the area over his right plantar fifth metatarsal head which had been doing so well 2 weeks ago completely necrotic feeling bone. I do not know that this is going to be salvageable. The left foot wound is certainly no smaller but it has a better surface and is superficial. 7/8; patient called in this morning to say that his total contact cast was rubbing against his foot. He states he is doing fine overall. He denies signs of infection. 7/12; continued deterioration in the wound over the right fifth metatarsal head crumbling bone. This is not going to be salvageable. The patient agrees and wants to be referred to Dr. Doran Durand which we will attempt to arrange as soon as possible. I am going to continue him on antibiotics as long as that takes so I will renew those today. The area on the left foot which is the base of the fifth metatarsal continues to look somewhat better. Healthy looking tissue no depth no debridement is necessary here. 7/20; the patient was kindly seen by Dr. Doran Durand of orthopedics on 10/19/2020. 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 Towards this end we have taken him out of the total contact cast on the left we will put him in a wrap with Hydrofera Blue. As I understand things  surgery is planned for 7/21 7/27; patient had his surgery last Thursday. 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 Apparently everything went well we did not still disturb that today The area on the left foot actually looks quite good. He has been much less mobile which probably explains this he did not seem to do well in the total contact cast secondary to drainage and maceration I think. We have been using Hydrofera Blue 11/09/2020 upon evaluation today patient  appears to be doing well with regard to his plantar foot ulcer on the left foot. Fortunately there is no evidence of active infection at this time. No fevers, chills, nausea, vomiting, or diarrhea. Overall I think that he is actually doing extremely well. Nonetheless I do believe that he is staying off of this more following the surgery in his right foot that is the reason the left is doing so great. 8/16; left plantar foot wound. This looks smaller than the last time I saw this he is using Hydrofera Blue. The surgical wound on the right foot is being followed by Dr. Doran Durand we did not look at this today. He has surgical shoes on both feet 8/23; left plantar foot wound not as good this week. Surrounding macerated skin and subcutaneous tissue everything looks moist and wet. I do not think he is offloading this adequately. He is using a surgical shoe Apparently the right foot surgical wound is not open although I did not check his foot 8/31; left plantar foot lateral aspect. Much improved this week. He has no maceration. Some improvement in the surface area of the wound but most impressively the depth is come in we are using silver alginate. The patient is a Product/process development scientist. He is asked that we write him a letter so he can go back to work. I have also tried to see if we can write something that will allow him to limit the amount of time that he is on his foot at work. 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 Hopefully the latter can be avoided 9/6; I believe the patient missed an appointment last week. He arrives in today with a wound looking roughly the same certainly no better. Undermining laterally Napoli, [name] ([id number]) [id number] Page 7 of 22 and also inferiorly. We used molecuLight today in training with the patient's permission.. We are using silver alginate 9/21 wound is  measuring bigger this week although this may have to do with the aggressive circumferential debridement last week in response to the blush fluorescence on the MolecuLight. Culture I did last week showed significant MSSA and E. coli. I put him on Augmentin but he has not started it yet. We are also going to send this for compounded antibiotics at [facility]. There is no evidence of systemic infection 9/29; silver alginate. His Keystone arrived. He is completing Augmentin in 2 days. Offloading in a cam boot. Moderate drainage per our intake staff 10/5; using silver alginate. He has been using his Krebs. He has completed his Augmentin. Per our intake nurse still a lot of drainage, far too much to consider a total contact cast. Wound measures about the same. He had the same undermining area that I defined last week from a roughly 11-3. I remove this today 10/12; using silver alginate he is using the [place]. He comes in for a nurse visit hence we are applying [employer] twice a week. Measuring  slightly better today and less notable drainage. Extensive debridement of the wound edge last time 10/18; using topical Keystone and silver alginate and a soft cast. Wound measurements about the same. Drainage was through his soft cast. We are changing this twice a week Tuesdays and Friday 10/25; comes in with moderate drainage. Still using Keystone silver alginate and a soft cast. Wound dimensions completely the same.He has a lot of edema in the left leg he has lymphedema. Asking for [name] to consider wrapping him as he cannot get his stocking on over the soft cast 11/2; comes in with moderate to large drainage slightly smaller in terms of width we have been using [place]. His wound looks satisfactory but not much improvement 11/4; patient presents today for obligatory cast change. Has no issues or complaints today. He denies signs of infection. 11/9; patient traveled this weekend to [place], was on the cast quite a bit.  Staining of the cast with black material from his walking boot. Drainage was not quite as bad as we feared. Using silver alginate and Keystone 11/16; we do not have size for cast therefore we have been putting a soft cast on him since the change on Friday. Still a significant amount of drainage necessitating changing twice a week. We have been using the Keystone at cast changes either hard or soft as well as silver alginate Comes in the clinic with things actually looking fairly good improvement in width. He says his offloading is about the same 02/24/2021 upon evaluation today patient actually comes back in and is doing excellent in regard to his foot ulcer this is significantly smaller even compared to the last visit. The soft cast seems to have done extremely well for him which is great news. I do not see any signs of infection minimal debridement will be needed today. 11/30; left lateral foot much improved half a centimeter improvement in surface area. No evidence of infection. He seems to be doing better with the soft cast in the TCC therefore we will continue with this. He comes back in later in the week for a change with the nurses. This is due to drainage 12/6; no improvement in dimensions. Under illumination some debris on the surface we have been using silver alginate, soft cast. If there is anything optimistic here he seems to have have less drainage 12/13. Dimensions are improved both length and width and slightly in depth. Appears to be quite healthy today. Raised edges of this thick skin and callus around the edges however. He is in a soft cast were bringing him back once for a change on Friday. Drainage is better 12/20. Dimensions are improved. He still has raised edges of thick skin and callus around the edges. We are using a soft cast 12/28; comes in today with thick callus around the wound. Using silver under alginate under a soft cast. I do not think there is much improvement in  any measurement 2023 04/06/2021; patient was put in a total contact cast. Unfortunately not much change in surface area 1/10; not much different still thick callus and skin around the edge in spite of the total contact cast. This was just debrided last week we have been using the [facility] compounded antibiotic and silver alginate under a total contact cast 1/18 the patient's wound on the left side is doing nicely. smaller HOWEVER he comes in today with a wound on the right foot laterally. blister most likely serosangquenous drainage 1/24; the patient continues to do well in  terms of the plantar left foot which is continued to contract using silver alginate under the total contact cast HOWEVER the right lateral foot is bigger with denuded skin around the edges. I used pickups and a #15 scalpel to remove this this looks like the remanence of a large blister. Cannot rule out infection. Culture in this area I did last week showed Staphylococcus lugdunensis few colonies. I am going to try to address this with his [employer] antibiotic that is done so well on the left having linezolid and this should cover the staph 2/1; the patient's wound on his left foot which was the original plantar foot wound thick skin and eschar around the edges even in the total contact cast but the wound surface does not look too bad The real problem is on how his right lateral foot at roughly the base of the fifth metatarsal. The wound is completely necrotic more worrisome than that there is swelling around the edges of this. We have been using silver alginate on both wounds and Keystone on the right foot. Unfortunately I think he is going to require systemic antibiotics while we await cultures. He did not get the x-ray done that we ordered last week [lost the prescription 2/7; disappointingly in the area on the left foot which we are treating with a total contact cast is still not closed although it is much smaller. He continues  to have a lot of callus around the wound edge. -Right lateral foot culture I did last week was negative x-ray also negative for osteomyelitis. 2/15: TCC silver alginate on the left and silver alginate on the right lateral. No real improvement in either area 05/26/2021: T oday, the wounds are roughly the same size as at his previous visit, post-debridement. He continues to endorse fairly substantial drainage, particularly on the right. He has been in a total contact cast on the left. There is still some callus surrounding this lesion. On the right, the periwound skin is quite macerated, along with surrounding callus. The center of the right-sided wound also has some dark, densely adherent material, which is very difficult to remove. 06/02/2021: Today, both wounds are slightly smaller. He has been using zinc oxide ointment around the right ulcer and the degree of maceration has improved markedly. There continues to be an area of nonviable tissue in the center of the right sided ulcer. The left-sided wound, which has been in the total contact cast. Appears clean and the degree of callus around it is less than previously. 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 He had to take the stairs and both wounds have increased in size. The left foot, which has been in a total contact cast, has developed a tunnel tracking to the lateral aspect of his foot. The nonviable tissue in the center of the right-sided ulcer remains recalcitrant to debridement. There is significant undermining surrounding the entirety of the left sided wound. 06/16/2021: The elevator at school has been fixed and the patient has been able to avoid putting as much weight on his wounds over the past week. We converted the left foot wound into a single lesion today, but despite this, the wound is actually smaller. The base is healthy with limited periwound callus. On the right, the  central necrotic area is still present. He continues to be quite macerated around the right sided wound, despite applying barrier cream. This does, however, have the benefit of softening the  callus to make it more easily removable. 06/23/2021: Today, the left wound is smaller. The lateral aspect that had opened up previously is now closed. The wound base has a healthy bed of granulation tissue and minimal slough. Unfortunately, on the right, the wound is larger and continues to be fairly macerated. He has also reopened the wound at his right ankle. He thinks this is due to the gait he has adopted secondary to his total contact cast and boot. 06/30/2021: T oday, both wounds are a little bit larger. The lateral aspect on the left has remained closed. He continues to have significant periwound maceration. The culture that I took from the right sided wound grew a population of bacteria that is not covered by his current [facility] antibiotic. The center of the right- sided wound continues to appear necrotic with nonviable fat. It probes deeper today, but does not reach bone. Stamour, [name] ([id number]) [id number] Page 8 of 22 07/07/2021: The periwound maceration is a little bit less today. The right lateral foot wound has some areas that appear more viable and the necrotic center also looks a little bit better. The wound on the dorsal surface of his right foot near the ankle is contracting and the surface appears healthy. The left plantar wound surface looks healthy, but there is some new undermining on the medial portion. He did get his new Keystone antibiotic and began applying that to the right foot wound on Saturday. 07/14/2021: The intake nurse reported substantial drainage from his wounds, but the periwound skin actually looks better than is typical for him. The wound on the dorsal surface of his right foot near the ankle is smaller and just has a small open area underneath some  dried eschar. The left plantar wound surface looks healthy and there has been no significant accumulation of callus. The right lateral foot wound looks quite a bit better, with the central portion, which typically appears necrotic, looking more viable albeit pale. 07/22/2021: His left foot is extremely macerated today. The wound is about the same size. The wound on the dorsal surface of his right foot near the ankle had closed, but he traumatized it removing the dressing and there is a tiny skin tear in that location. The right lateral foot wound is bigger, but the surface appears healthy. 07/30/2021: The wound on the dorsal surface of his right foot near the ankle is closed. The right lateral foot wound again is a little bit bigger due to some undermining. The periwound skin is in better condition, however. He has been applying zinc oxide. The wound surface is a little bit dry today. On the left, he does not have the substantial maceration that we frequently see. The wound itself is smaller and has a clean surface. 08/06/2021: Both wounds seem to have deteriorated over the past week. The right lateral foot wound has a dry surface but the periwound is boggy.. Overall wound dimensions are about the same. On the left, the wound is about the same size, but there is more undermining present underneath periwound callus. 08/13/2021: The right sided wound looks about the same, but on the left there has been substantial deterioration. The undermining continues to extend under periwound callus. Once this was removed, substantial extension of the wound was present. There is no odor or purulent drainage but clearly the wounds have broken down. 08/20/2021: The wounds look about the same today. He has been out of his total contact cast and has just been changing the dressings  using topical Keystone with PolyMem Ag, Kerlix and Ace bandages. The wound on the top of his right ankle has reopened but this is quite small.  There was a little bit of purulent material that I expressed when examining this wound. 08/24/2021: After the aggressive debridement I performed at his last visit, the wounds actually look a little bit better today. They are smaller with the exception of the wound on the top of his right ankle which is a little bit bigger as some more skin pulled off when he was changing his dressing. We are using topical Keystone with PolyMem Ag Kerlix and Ace bandages. 09/02/2021: There has been really no change to any of his wounds. 09/16/2021: The patient was hospitalized last week with nausea, vomiting, and dehydration. He says that while he was in the hospital, his wounds were not really addressed properly. T oday, both plantar foot wounds are larger and the periwound skin is macerated. The wound on the dorsum of his right foot has a scab on the top. The right foot now has a crater where previously he had had nonviable fat. It looks as though this simply died and fell out. The periwound callus is wet. 09/24/2021: His wounds have deteriorated somewhat since his last visit. The wound on the dorsum of his right foot near his ankle is larger and has more nonviable tissue present. The crater in his right foot is even deeper; I cannot quite palpate or probe to bone but I am sure it is close. The wound on his left plantar foot has an odd boggy area in the center that almost feels as though it has fluid within it. He has run out of his topical Keystone antibiotic. We are using silver alginate on his wounds. 09/29/2021: He has developed a new wound on the dorsum of his left foot near his ankle. He says he thinks his wrapping is rubbing in that site. I would concur with this as the wound on his right ankle is larger. The left foot looks about the same. The right foot has the crater that was present last week. No significant slough accumulation, but his foot remains quite swollen and warm despite oral antibiotic  therapy. 10/08/2021: All of his wounds look about the same as last week. He did not start his oral antibiotics that are prescribed until just a couple of days ago; his [employer] compounded antibiotics formula has been changed and he is awaiting delivery of the new recipe. His MRI that was scheduled for earlier this week was canceled as no prior authorization had been obtained; unfortunately the tech responsible sent an email to my old [place] email, which I no longer use nor have access to. 10/18/2021: The wounds on his bilateral dorsal feet near the ankles are both improved. They are smaller and have just some eschar and slough buildup. The left plantar wound has a fair amount of undermining, but the surface is clean. There is some periwound callus accumulation. On the right plantar foot, there is nonviable fat leading to a deep tunnel that tracks towards his dorsal medial foot. There is periwound callus and slough accumulation, as well. His right foot and leg remain swollen as compared to the left. 10/25/2021: The wounds on his bilateral dorsal feet and at the ankles have broken down somewhat. They are little bit larger than last week. The left plantar wound continues to undermine laterally but the surface is clean. The right plantar foot wound shows some decreased depth in  the tunnel tracking towards his dorsal medial foot. He has not yet had the Doppler study that I ordered; it sounds like there is some confusion about the scheduling of the procedure. In addition, the MRI was denied and I have taken steps to appeal the denial. 11/24/2021: Since our last visit, Mr. [name] was admitted to the hospital where an MRI suggested osteomyelitis. He was taken the operating room by podiatry. Bone biopsies were negative for osteomyelitis. They debrided his wounds and applied myriad matrix. He saw them last week and they removed his staples. He is here today to continue his wound healing process. T oday, both  of the dorsal foot/ankle wounds are substantially smaller. There is just a little eschar overlying the left sided wound and some eschar and slough on the right. The right plantar foot ulcer has the healthiest surface of granulation tissue that I have seen to date. A portion of the myriad matrix failed to take and was hanging loose. It appears that myriad morcells were placed into the tunnel closest to the dorsal portion of his foot. These have sloughed off. The left plantar foot ulcer is about the same size, but has a much healthier surface than in the past. Both plantar ulcers have callus and slough accumulation. 12/07/2021: Left dorsal foot/ankle wound is closed. The right dorsal foot/ankle wound is nearly closed and just has a small open area with some eschar and slough. The right plantar foot wound has contracted quite a bit since our last visit. It has a healthy surface with just a little bit of slough accumulation and periwound callus buildup. The left plantar foot wound is about the same size but the surface appears healthy. There is a little slough and periwound callus on this side, as well. 12/15/2021: Both dorsal foot/ankle wounds are closed. The right plantar foot wound is substantially smaller than at our last visit. The tunneling that was present has nearly closed. There is just a little bit of slough buildup. The left plantar foot wound is also a little bit smaller today. The surface is the healthiest that I have ever seen it. Light slough and periwound callus accumulation on this side. 12/22/2021: The dorsal ankle/foot wounds remain closed. The right plantar foot wound continues to contract. There is still a bit of depth at the lateral portion of the wound but the surface has a good granulated appearance. The wound on the left is about the same size, the central indented portion is still adherent. It also is clean with good granulation tissue present. The periwound skin and callus are a bit  macerated, however. 12/29/2021: Both ankle wounds remain closed. The right plantar foot wound is less than half the size that it was last week. There is no depth any longer and the surface has just a little bit of slough and periwound callus. On the left, the wound is not much smaller, but the surface is healthier with good granulation tissue. There is also a little slough and periwound callus buildup. 01/05/2022: The right plantar foot wound continues to contract and is once again about half the size as it was last week. There is just a little bit of surface slough and periwound callus. On the left, the depth of the wound has decreased and the diameter has started to contract. It is also very clean with just a little slough and biofilm present. [name] [name] ([id number]) [id number] Page 9 of 22 01/12/2022: The right plantar foot wound is smaller again today. There is  just some periwound callus and slough accumulation. On the left, there is a fair amount of undermining and the surface is a little boggy, but no other significant change. 01/19/2022: The right plantar wound continues to contract. There is minimal periwound callus accumulation and just a light layer of slough on the surface. On the left, the surface looks better, but there is fairly significant undermining near the 11:00 portion of the wound, aiming towards his great toe. The skin overlying this area is very healthy and has a good fat layer present. No malodor or purulent drainage. Electronic Signature(s) Signed: 01/19/2022 9:47:51 AM By: [name] MD FACS Entered By: [name] on 01/19/2022 09:47:51 -------------------------------------------------------------------------------- Physical Exam Details Patient Name: Date of Service: [name] [name], [name] [name] 01/19/2022 8:15 A M Medical Record Number: [id number] Patient Account Number: [id number] Date of Birth/Sex: Treating RN: 04-24-1985 (36 y.o.  M) Primary Care Provider: [name] Other Clinician: Referring Provider: Treating Provider/Extender: [name] in Treatment: 116 Constitutional . . . . No acute distress.Marland Kitchen Respiratory Normal work of breathing on room air.. Notes 01/19/2022: The right plantar wound continues to contract. There is minimal periwound callus accumulation and just a light layer of slough on the surface. On the left, the surface looks better, but there is fairly significant undermining near the 11:00 portion of the wound, aiming towards his great toe. The skin overlying this area is very healthy and has a good fat layer present. No malodor or purulent drainage. Electronic Signature(s) Signed: 01/19/2022 9:48:30 AM By: [name] MD FACS Entered By: [name] on 01/19/2022 09:48:30 -------------------------------------------------------------------------------- Physician Orders Details Patient Name: Date of Service: [name] [name], [name] [name] 01/19/2022 8:15 A M Medical Record Number: [id number] Patient Account Number: [id number] Date of Birth/Sex: Treating RN: 04-13-1985 (36 y.o. 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 - Dr. [name] - Room 1 with [name] Return A Wed. 10/25 Calhoun, [name] ([id number]) [id number] Page 10 of 22 Bathing/ Shower/ Hygiene May shower and wash wound with soap and water. Edema Control - Lymphedema / SCD / Other Bilateral Lower  Extremities Elevate legs to the level of the heart or above for 30 minutes daily and/or when sitting, a frequency of: - throughout the day Avoid standing for long periods of time. Exercise regularly Compression stocking or Garment 20-30 mm/Hg pressure to: - to both legs daily Off-Loading Open toe surgical shoe to: - Both feet Additional Orders / Instructions Follow Nutritious Diet Wound Treatment Wound #3 - Foot Wound Laterality: Plantar, Left, Lateral Cleanser: Soap and Water 1 x Per Day/30 Days Discharge Instructions: May shower and wash wound with dial antibacterial soap and water prior to dressing change. Peri-Wound Care: Zinc Oxide Ointment 30g tube 1 x Per Day/30 Days Discharge Instructions: Apply Zinc Oxide to periwound with each dressing change Peri-Wound Care: Sween Lotion (Moisturizing lotion) 1 x Per Day/30 Days Discharge Instructions: Apply moisturizing lotion as directed Prim Dressing: Hydrofera Blue Ready Foam, 4x5 in (Generic) 1 x Per Day/30 Days ary Discharge Instructions: Apply to wound bed as instructed Secondary Dressing: ABD Pad, 8x10 (DME) (Generic) 1 x  Per Day/30 Days Discharge Instructions: Apply over primary dressing as directed. Secondary Dressing: Optifoam Non-Adhesive Dressing, 4x4 in (DME) (Generic) 1 x Per Day/30 Days Discharge Instructions: Apply over primary dressing as directed. Secondary Dressing: Woven Gauze Sponge, Non-Sterile 4x4 in (DME) (Generic) 1 x Per Day/30 Days Discharge Instructions: Apply over primary dressing as directed. Secondary Dressing: Zetuvit Absorbent Pad, 4x4 (in/in) (DME) (Dispense As Written) 1 x Per Day/30 Days Secured With: 677M Medipore H Soft Cloth Surgical T ape, 4 x 10 (in/yd) (DME) (Dispense As Written) 1 x Per Day/30 Days Discharge Instructions: Secure with tape as directed. Compression Wrap: Kerlix Roll 4.5x3.1 (in/yd) (DME) (Generic) 1 x Per Day/30 Days Discharge Instructions: Apply Kerlix and Coban compression as  directed. Compression Wrap: 677M ACE Elastic Bandage With VELCRO Brand Closure, 4 (in) (DME) (Generic) 1 x Per Day/30 Days Wound #8 - Foot Wound Laterality: Right, Lateral Cleanser: Soap and Water 1 x Per Day/30 Days Discharge Instructions: May shower and wash wound with dial antibacterial soap and water prior to dressing change. Peri-Wound Care: Sween Lotion (Moisturizing lotion) 1 x Per Day/30 Days Discharge Instructions: Apply moisturizing lotion as directed Prim Dressing: Promogran Prisma Matrix, 4.34 (sq in) (silver collagen) (DME) (Generic) 1 x Per Day/30 Days ary Discharge Instructions: Moisten collagen with saline or hydrogel Secondary Dressing: ABD Pad, 8x10 (DME) (Generic) 1 x Per Day/30 Days Discharge Instructions: Apply over primary dressing as directed. Secondary Dressing: Optifoam Non-Adhesive Dressing, 4x4 in (DME) (Generic) 1 x Per Day/30 Days Discharge Instructions: Apply over primary dressing as directed. Secondary Dressing: Woven Gauze Sponge, Non-Sterile 4x4 in (DME) (Generic) 1 x Per Day/30 Days Discharge Instructions: Apply over primary dressing as directed. Secondary Dressing: Zetuvit Absorbent Pad, 4x4 (in/in) (DME) (Dispense As Written) 1 x Per Day/30 Days Secured With: 677M Medipore H Soft Cloth Surgical T ape, 4 x 10 (in/yd) (DME) (Dispense As Written) 1 x Per Day/30 Days Discharge Instructions: Secure with tape as directed. Compression Wrap: Kerlix Roll 4.5x3.1 (in/yd) (DME) (Generic) 1 x Per Day/30 Days Discharge Instructions: Apply Kerlix and Coban compression as directed. Compression Wrap: 677M ACE Elastic Bandage With VELCRO Brand Closure, 4 (in) (DME) (Generic) 1 x Per Day/30 Days [name] [name] ([id number]) [phone].pdf Page 11 of 22 Electronic Signature(s) Signed: 01/19/2022 12:35:35 PM By: [name] MD FACS Entered By: [name] on 01/19/2022  09:48:47 -------------------------------------------------------------------------------- Problem List Details Patient Name: Date of Service: [name] [name], [name] [name] 01/19/2022 8:15 A M Medical Record Number: [id number] Patient Account Number: [id number] Date of Birth/Sex: Treating RN: 11-Oct-1985 (36 y.o. [name] Primary Care Provider: [name] Other Clinician: Referring Provider: Treating Provider/Extender: [name] in Treatment: [phone] Active Problems ICD-10 Encounter Code Description Active Date MDM Diagnosis E11.621 Type 2 diabetes mellitus with foot ulcer 10/24/2019 No Yes L97.528 Non-pressure chronic ulcer of other part of left foot with other specified 10/24/2019 No Yes severity L97.518 Non-pressure chronic ulcer of other part of right foot with other specified 10/24/2019 No Yes severity Inactive Problems ICD-10 Code Description Active Date Inactive Date L97.518 Non-pressure chronic ulcer of other part of right foot with other specified severity 07/14/2020 07/14/2020 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, right ankle and foot 10/24/2019 10/24/2019 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 08/10/2020 08/10/2020 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, left ankle and foot 10/24/2019 10/24/2019 L97.322 Non-pressure chronic ulcer of left ankle with fat layer exposed 09/29/2021 09/29/2021 B95.62 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 10/24/2019 10/24/2019 classified elsewhere Resolved  Problems Electronic Signature(s) Signed: 01/19/2022 9:23:09 AM By: [name] MD FACS Entered By: [name] on 01/19/2022 09:23:08 [name] [name] ([id number]) [id number] Page 12 of 22 -------------------------------------------------------------------------------- Progress Note Details Patient Name: Date of Service: [name] [name] 01/19/2022 8:15  A M Medical Record Number: [id number] Patient Account Number: [id number] Date of Birth/Sex: Treating RN: 06/03/1985 (36 y.o. 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 He comes in for review of a wound over the plantar fifth metatarsal head extending into the lateral part of the foot. He was followed for this previously by his podiatrist Dr. Cornelius Moras. As the patient tells his story he went to see podiatry first for a swelling he developed on the lateral part of his fifth metatarsal head in May. He states this was "open" by podiatry and the area closed. He was followed up in June and it was again opened callus removed and it closed promptly. There were plans being made for surgery on the fifth metatarsal head in June however his blood sugar was apparently too high for anesthesia. Apparently the area was debrided and opened again in June and it is never closed since. Looking over the records from podiatry I am really not able to follow this. It was clear when he was first seen it was before 5/14 at that point he already had a wound. By 5/17 the ulcer was resolved. I do not see anything about a procedure. On 5/28 noted to have pre-ulcerative moderate keratosis. X-ray noted 1/5 contracted toe and tailor's bunion and metatarsal deformity. On a visit date on 09/28/2018 the dorsal part of the left foot it healed and resolved.  There was concern about swelling in his lower extremity he was sent to the ER.. As far as I can tell he was seen in the ER on 7/12 with an ulcer on his left foot. A DVT rule out of the left leg was negative. I do not think I have complete records from podiatry but I am not able to verify the procedures this patient states he had. He states after the last procedure the wound has never closed although I am not able to follow this in the records I have from podiatry. He has not had a recent x-ray The patient has been using Neosporin on the wound. He is wearing a Darco shoe. He is still very active up on his foot working and exercising. Past medical history; type 2 diabetes ketosis-prone, leg swelling with a negative DVT study in July. Non-smoker ABI in our clinic was 0.85 on the left 10/16; substantial wound on the plantar left fifth met head extending laterally almost to the dorsal fifth MTP. We have been using silver alginate we gave him a Darco forefoot off loader. An x-ray did not show evidence of osteomyelitis did note soft tissue emphysema which I think was due to gas tracking through an open wound. There is no doubt in my mind he requires an MRI 10/23; MRI not booked until 3 November at the earliest  this is largely due to his glucose sensor in the right arm. We have been using silver alginate. There has been an improvement 10/29; I am still not exactly sure when his MRI is booked for. He says it is the third but it is the 10th in epic. This definitely needs to be done. He is running a low-grade fever today but no other symptoms. No real improvement in the 1 02/26/2019 patient presents today for a follow-up visit here in our clinic he is last been seen in the clinic on October 29. Subsequently we were working on getting MRI to evaluate and see what exactly was going on and where we would need to go from the standpoint of whether or not he had osteomyelitis and again what treatments were going be  required. Subsequently the patient ended up being admitted to the hospital on 02/07/2019 and was discharged on 02/14/2019. This is a somewhat interesting admission with a discharge diagnosis of pneumonia due to COVID-19 although he was positive for COVID-19 when tested at the urgent care but negative x2 when he was actually in the hospital. With that being said he did have acute respiratory failure with hypoxia and it was noted he also have a left foot ulceration with osteomyelitis. With that being said he did require oxygen for his pneumonia and I level 4 L. He was placed on antivirals and steroids for the COVID-19. He was also transferred to the [facility] at one point. Nonetheless he did subsequently discharged home and since being home has done much better in that regard. The CT angiogram did not show any pulmonary embolism. With regard to the osteomyelitis the patient was placed on vancomycin and Zosyn while in the hospital but has been changed to Augmentin at discharge. It was also recommended that he follow- up with wound care and podiatry. Podiatry however wanted him to see [name] according to the patient prior to them doing anything further. His hemoglobin A1c was 9.9 as noted in the hospital. Have an MRI of the left foot performed while in the hospital on 02/04/2019. 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 There is an overlying plantar open wound noted an abscess tracking back along the lateral aspect of the fifth metatarsal shaft. There is otherwise diffuse cellulitis and mild fasciitis without findings of polymyositis. The patient did have recently pneumonia secondary to COVID-19 I looked in the chart through epic and it does appear that the patient may need to have an additional x-ray just to ensure everything is cleared and that he has no airspace disease prior to putting him into the  [name]. 03/05/2019; patient was readmitted to the clinic last week. He was hospitalized twice for a viral upper respiratory tract infection from 11/1 through 11/4 and then 11/5 through 11/12 ultimately this turned out to be Covid pneumonitis. Although he was discharged on oxygen he is not using it. He says he feels fine. He has no exercise limitation no cough no sputum. His O2 sat in our clinic today was 100% on room air. 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 He received Vanco and Zosyn in the hospital and then was discharged on 2 weeks of Augmentin. I do not see any relevant cultures. He was supposed to follow-up with infectious disease but I do not  see that he has an appointment. 12/8; patient saw Dr. Novella Olive of infectious disease last week. He felt that he had had adequate antibiotic therapy. He did not go to follow-up with Dr. Amalia Hailey of podiatry and I have again talked to him about the pros and cons of this. He does not want to consider a ray amputation of this time. He is aware of the risks of recurrence, migration etc. He started HBO today and tolerated this well. He can complete the Augmentin that I gave him last week. I have looked over the lab work that Dr. Chana Bode ordered his C-reactive protein was 3.3 and his sedimentation rate was 17. The C-reactive protein is never really been measurably that high in this patient 12/15; not much change in the wound today however he has undermining along the lateral part of the foot again more extensively than last week. He has some rims of epithelialization. We have been using silver alginate. He is undergoing hyperbarics but did not dive today 12/18; in for his obligatory first total contact cast change. Unfortunately there was pus coming from the undermining area around his fifth metatarsal head. This was cultured but will preclude  reapplication of a cast. He is seen in conjunction with HBO [name] [name] ([id number]) [phone].pdf Page 13 of 22 12/24; patient had staph lugdunensis in the wound in the undermining area laterally last time. We put him on doxycycline which should have covered this. The wound looks better today. I am going to give him another week of doxycycline before reattempting the total contact cast 12/31; the patient is completing antibiotics. Hemorrhagic debris in the distal part of the wound with some undermining distally. He also had hyper granulation. Extensive debridement with a #5 curette. The infected area that was on the lateral part of the fifth met head is closed over. I do not think he needs any more antibiotics. Patient was seen prior to HBO. Preparations for a total contact cast were made in the cast will be placed post hyperbarics 04/11/19; once again the patient arrives today without complaint. He had been in a cast all week noted that he had heavy drainage this week. This resulted in large raised areas of macerated tissue around the wound 1/14; wound bed looks better slightly smaller. Hydrofera Blue has been changing himself. He had a heavy drainage last week which caused a lot of maceration around the wound so I took him out of a total contact cast he says the drainage is actually better this week He is seen today in conjunction with HBO 1/21; returns to clinic. He was up in [place] for a day or 2 attending a funeral. He comes back in with the wound larger and with a large area of exposed bone. He had osteomyelitis and septic arthritis of the fifth left metatarsal head while he was in hospital. He received IV antibiotics in the hospital for a prolonged period of time then 3 weeks of Augmentin. Subsequently I gave him 2 weeks of doxycycline for more superficial wound infection. When I saw this last week the wound was smaller the surface of the wound looks  satisfactory. 1/28; patient missed hyperbarics today. Bone biopsy I did last time showed Enterococcus faecalis and Staphylococcus lugdunensis . He has a wide area of exposed bone. We are going to use silver alginate as of today. I had another ethical discussion with the patient. 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 In this situation I think the likelihood  of healing this is low. Therefore I have recommended a ray amputation and with the patient's agreement I have referred him to Dr. Doran Durand. The other issue is that his compliance with hyperbarics has been minimal because of his work schedule and given his underlying decision I am going to stop this today READMISSION 10/24/2019 MRI 09/29/2019 left foot IMPRESSION: 1. Apparent skin ulceration inferior and lateral to the 5th metatarsal base with underlying heterogeneous T2 signal and enhancement in the subcutaneous fat. Small peripherally enhancing fluid collections along the plantar and lateral aspects of the 5th metatarsal base suspicious for abscesses. 2. Interval amputation through the mid 5th metatarsal with nonspecific low-level marrow edema and enhancement. Given the proximity to the adjacent soft tissue inflammatory changes, osteomyelitis cannot be excluded. 3. The additional bones appear unremarkable. MRI 09/29/2019 right foot IMPRESSION: 1. Soft tissue ulceration lateral to the 5th MTP joint. There is low-level T2 hyperintensity within the 4th and 5th metatarsal heads and adjacent proximal phalanges without abnormal T1 signal or cortical destruction. These findings are nonspecific and could be seen with early marrow edema, hyperemia or early osteomyelitis. No evidence of septic joint. 2. Mild tenosynovitis and synovial enhancement associated with the extensor digitorum tendons at the level of the midfoot. 3. Diffuse low-level muscular T2 hyperintensity and enhancement, most consistent with  diabetic myopathy. LEFT FOOT BONE Methicillin resistant staphylococcus aureus Staphylococcus lugdunensis MIC MIC CIPROFLOXACIN >=8 RESISTANT Resistant <=0.5 SENSI... Sensitive CLINDAMYCIN <=0.25 SENS... Sensitive >=8 RESISTANT Resistant ERYTHROMYCIN >=8 RESISTANT Resistant >=8 RESISTANT Resistant GENTAMICIN <=0.5 SENSI... Sensitive <=0.5 SENSI... Sensitive Inducible Clindamycin NEGATIVE Sensitive NEGATIVE Sensitive OXACILLIN >=4 RESISTANT Resistant 2 SENSITIVE Sensitive RIFAMPIN <=0.5 SENSI... Sensitive <=0.5 SENSI... Sensitive TETRACYCLINE <=1 SENSITIVE Sensitive <=1 SENSITIVE Sensitive TRIMETH/SULFA <=10 SENSIT Sensitive <=10 SENSIT Sensitive ... Marland Kitchen.. VANCOMYCIN 1 SENSITIVE Sensitive <=0.5 SENSI... Sensitive Right foot bone . Component 3 wk ago Specimen Description BONE Special Requests RIGHT 4 METATARSAL SAMPLE B Gram Stain NO WBC SEEN NO ORGANISMS SEEN Culture RARE METHICILLIN RESISTANT STAPHYLOCOCCUS AUREUS NO ANAEROBES ISOLATED Performed at [facility], [place] [address], [place], [place] [address] Report Status 10/08/2019 FINAL Organism ID, Bacteria METHICILLIN RESISTANT STAPHYLOCOCCUS AUREUS Resulting Agency [facility] Susceptibility Methicillin resistant staphylococcus aureus MIC CIPROFLOXACIN >=8 RESISTANT Resistant CLINDAMYCIN <=0.25 SENS... Sensitive ERYTHROMYCIN >=8 RESISTANT Resistant GENTAMICIN <=0.5 SENSI... Sensitive [place], [name] ([id number]) [id number] Page 14 of 22 Inducible Clindamycin NEGATIVE Sensitive OXACILLIN >=4 RESISTANT Resistant RIFAMPIN <=0.5 SENSI... Sensitive TETRACYCLINE <=1 SENSITIVE Sensitive TRIMETH/SULFA <=10 SENSIT Sensitive ... VANCOMYCIN 1 SENSITIVE Sensitive This is a patient we had in clinic earlier this year with a wound over his left fifth metatarsal head. 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 Doran Durand on 05/16/2019. He seems to have developed problems with wounds on his bilateral feet in June 2021 although he may have had problems earlier than this. He was in an urgent care with a right foot ulcer on 09/26/2019 and given a course of doxycycline. This was apparently after having trouble getting into see orthopedics. He was seen by podiatry on 09/28/2019 noted to have bilateral lower extremity ulcers including the left lateral fifth metatarsal base and the right subfifth met head. It was noted that had purulent drainage at that time. He required hospitalization from 6/20 through  7/2. This was because of worsening right foot wounds. He underwent bilateral operative incision and drainage and bone biopsies bilaterally. Culture results are listed above. He has been referred back to clinic by Dr. Jacqualyn Posey of podiatry. He is also followed by Dr. Megan Salon who saw him yesterday. He was discharged from hospital on Zyvox Flagyl and Levaquin and yesterday changed to doxycycline Flagyl and Levaquin. His inflammatory markers on 6/26 showed a sedimentation rate of 129 and a C-reactive protein of 5. This is improved to 14 and 1.3 respectively. This would indicate improvement. ABIs in our clinic today were 1.23 on the right and 1.20 on the left 11/01/2019 on evaluation today patient appears to be doing fairly well in regard to the wounds on his feet at this point. Fortunately there is no signs of active infection at this time. No fevers, chills, nausea, vomiting, or diarrhea. He currently is seeing infectious disease and still under their care at this point. Subsequently he also has both wounds which she has not been using collagen on as he did not receive that in his packaging he did not call us and let us know that.  Apparently that just was missed on the order. Nonetheless we will get that straightened out today. 8/9-Patient returns for bilateral foot wounds, using Prisma with hydrogel moistened dressings, and the wounds appear stable. Patient using surgical shoes, avoiding much pressure or weightbearing as much as possible 8/16; patient has bilateral foot wounds. 1 on the right lateral foot proximally the other is on the left mid lateral foot. Both required debridement of callus and thick skin around the wounds. We have been using silver collagen 8/27; patient has bilateral lateral foot wounds. The area on the left substantially surrounded by callus and dry skin. This was removed from the wound edge. The underlying wound is small. The area on the right measured somewhat smaller today. 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 Unfortunately I did not update his antibiotics during today's visit. 9/10 I reviewed Dr. Hale Bogus last notes he felt he had completed antibiotics his inflammatory markers were reasonably well controlled. He has a small wound on the lateral left foot and a tiny area on the right which is just above closed. He is using Hydrofera Blue with border foam he has bilateral surgical shoes 9/24; 2 week f/u. doing well. right foot is closed. left foot still undermined. 10/14; right foot remains closed at the fifth met head. The area over the base of the left fifth metatarsal has a small open area but considerable undermining towards the plantar foot. Thick callus skin around this suggests an adequate pressure relief. We have talked about this. He says he is going to go back into his cam boot. I suggested a total contact cast he did not seem enamored with this suggestion 10/26; left foot base of the fifth metatarsal. Same condition as last time. He has skin over the area with an open wound however the skin is not adherent. He went  to see Dr. Earleen Newport who did an x-ray and culture of his foot I have not reviewed the x-ray but the patient was not told anything. He is on doxycycline 11/11; since the patient was last here he was in the emergency room on 10/30 he was concerned about swelling in the left foot. They did not do any cultures or x-rays. They changed his antibiotics to cephalexin. Previous culture showed group B strep. The cephalexin  is appropriate as doxycycline has less than predictable coverage. Arrives in clinic today with swelling over this area under the wound. He also has a new wound on the right fifth metatarsal head 11/18; the patient has a difficult wound on the lateral aspect of the left fifth metatarsal head. The wound was almost ballotable last week I opened it slightly expecting to see purulence however there was just bleeding. I cultured this this was negative. X-ray unchanged. We are trying to get an MRI but I am not sure were going to be able to get this through his insurance. He also has an area on the right lateral fifth metatarsal head this looks healthier 12/3; the patient finally got our MRI. Surprisingly this did not show osteomyelitis. I did show the soft tissue ulceration at the lateral plantar aspect of the fifth metatarsal base with a tiny residual 6 mm abscess overlying the superficial fascia I have tried to culture this area I have not been able to get this to grow anything. Nevertheless the protruding tissue looks aggravated. I suspect we should try to treat the underlying "abscess with broad-spectrum antibiotics. I am going to start him on Levaquin and Flagyl. He has much less edema in his legs and I am going to continue to wrap his legs and see him weekly 12/10. I started Levaquin and Flagyl on him last week. He just picked up the Flagyl apparently there was some delay. The worry is the wound on the left fifth metatarsal base which is substantial and worsening. His foot looks like he inverts at  the ankle making this a weightbearing surface. Certainly no improvement in fact I think the measurements of this are somewhat worse. We have been using 12/17; he apparently just got the Levaquin yesterday this is 2 weeks after the fact. He has completed the Flagyl. The area over the left fifth metatarsal base still has protruding granulation tissue although it does not look quite as bad as it did some weeks ago. He has severe bilateral lymphedema although we have not been treating him for wounds on his legs this is definitely going to require compression. There was so much edema in the left I did not wish to put him in a total contact cast today. I am going to increase his compression from 3-4 layer. The area on the right lateral fifth met head actually look quite good and superficial. 12/23; patient arrived with callus on the right fifth met head and the substantial hyper granulated callused wound on the base of his fifth metatarsal. He says he is completing his Levaquin in 2 days but I do not think that adds up with what I gave him but I will have to double check this. We are using Hydrofera Blue on both areas. My plan is to put the left leg in a cast the week after New Year's 04/06/2020; patient's wounds about the same. Right lateral fifth metatarsal head and left lateral foot over the base of the fifth metatarsal. There is undermining on the left lateral foot which I removed before application of total contact cast continuing with Hydrofera Blue new. Patient tells me he was seen by endocrinology today lab work was done [Dr. Kerr]. Also wondering whether he was referred to cardiology. I went over some lab work from previously does not have chronic renal failure certainly not nephrotic range proteinuria he does have very poorly controlled diabetes but this is not his most updated lab work. Hemoglobin A1c has been over  11 1/10; the patient had a considerable amount of leakage towards mid part of his  left foot with macerated skin however the wound surface looks better the area on the right lateral fifth met head is better as well. I am going to change the dressing on the left foot under the total contact cast to silver alginate, continue with Hydrofera Blue on the right. 1/20; patient was in the total contact cast for 10 days. Considerable amount of drainage although the skin around the wound does not look too bad on the left foot. The area on the right fifth metatarsal head is closed. Our nursing staff reports large amount of drainage out of the left lateral foot wound 1/25; continues with copious amounts of drainage described by our intake staff. PCR culture I did last week showed E. coli and Enterococcus faecalis and low quantities. Multiple resistance genes documented including extended spectrum beta lactamase, MRSA, MRSE, quinolone, tetracycline. The wound is not quite as good this week as it was 5 days ago but about the same size 2/3; continues with copious amounts of malodorous drainage per our intake nurse. The PCR culture I did 2 weeks ago showed E. coli and low quantities of Enterococcus. There were multiple resistance genes detected. I put Neosporin on him last week although this does not seem to have helped. The wound is slightly deeper today. Offloading continues to be an issue here although with the amount of drainage she has a total contact cast is just not going to work 2/10; moderate amount of drainage. Patient reports he cannot get his stocking on over the dressing. I told him we have to do that the nurse gave him suggestions on how to make this work. The wound is on the bottom and lateral part of his left foot. Is cultured predominantly grew low amounts of Enterococcus, E. coli and anaerobes. There were multiple resistance genes detected including extended spectrum beta lactamase, quinolone, tetracycline. I could not think of an easy oral combination to address this so for now I  am going to do topical antibiotics provided by [facility] I think the main agents here are vancomycin and an aminoglycoside. We have to be able to give him access to the wounds to get the topical antibiotic on 2/17; moderate amount of drainage this is unchanged. He has his Keystone topical antibiotic against the deep tissue culture organisms. He has been using this and changing the dressing daily. Silver alginate on the wound surface. 2/24; using Keystone antibiotic with silver alginate on the top. He had too much drainage for a total contact cast at one point although I think that is improving and I think in the next week or 2 it might be possible to replace a total contact cast I did not do this today. In general the wound surface looks healthy however [place], [name] ([id number]) [id number] Page 15 of 22 he continues to have thick rims of skin and subcutaneous tissue around the wide area of the circumference which I debrided 06/04/2020 upon evaluation today patient appears to be doing well in regard to his wound. I do feel like he is showing signs of improvement. There is little bit of callus and dead tissue around the edges of the wound as well as what appears to be a little bit of a sinus tract that is off to the side laterally I would perform debridement to clear that away today. 3/17; left lateral foot. The wound looks about the same as I remember. Not  much depth surface looks healthy. No evidence of infection 3/25; left lateral foot. Wound surface looks about the same. Separating epithelium from the circumference. There really is no evidence of infection here however not making progress by my view 3/29; left lateral foot. Surface of the wound again looks reasonably healthy still thick skin and subcutaneous tissue around the wound margins. There is no evidence of infection. One of the concerns being brought up by the nurses has again the amount of drainage vis--vis  continued use of a total contact cast 4/5; left lateral foot at roughly the base of the fifth metatarsal. Nice healthy looking granulated tissue with rims of epithelialization. The overall wound measurements are not any better but the tissue looks healthy. The only concern is the amount of drainage although he has no surrounding maceration with what we have been doing recently to absorb fluid and protect his skin. He also has lymphedema. 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 He states that the kerlix/coban under the TCC rolled down and could not get it back up. He has been using an offloading scooter and has somehow hurt his right foot using this device. This happened last week. He states that the side of his right foot developed a blister and opened. The top of his foot also has a few small open wounds he thinks is due to his socks rubbing in his shoes. He has not been using any dressings to the wound. He denies purulent drainage, fever/chills or erythema to the wounds. 4/22; patient presents for 1 week follow-up. He developed new wounds to the right foot that were evaluated at last clinic visit. He continues to have a total contact cast to the left leg and he reports no issues. He has been using silver collagen to the right foot wounds with no issues. He denies purulent drainage, fever/chills or erythema to the right foot wounds. He has no complaints today 4/25; patient presents for 1 week follow-up. He has a total contact cast of the left leg and reports no issues. He has been using silver alginate to the right foot wound. He denies purulent drainage, fever/chills or erythema to the right foot wounds. 5/2 patient presents for 1 week follow-up. T  contact cast on the left. The wound which is on the base of the plantar foot at the base of the fifth metatarsal otal actually looks quite good and dimensions continue to gradually contract. HOWEVER the area on the right lateral fifth metatarsal head is much larger than what I remember from 2 weeks ago. Once more is he has significant levels of hypergranulation. Noteworthy that he had this same hyper granulated response on his wound on the left foot at one point in time. So much so that he I thought there was an underlying fluid collection. Based on this I think this just needs debridement. 5/9; the wound on the left actually continues to be gradually smaller with a healthy surface. Slight amount of drainage and maceration of the skin around but not too bad. However he has a large wound over the right fifth metatarsal head very much in the same configuration as his left foot wound was initially. I used silver nitrate to address the hyper granulated  tissue no mechanical debridement 5/16; area on the left foot did not look as healthy this week deeper thick surrounding macerated skin and subcutaneous tissue. oo The area on the right foot fifth met head was about the same oo The area on the right ankle that we identified last week is completely broken down into an open wound presumably a stocking rubbing issue 5/23; patient has been using a total contact cast to the left side. He has been using silver alginate underneath. He has also been using silver alginate to the right foot wounds. He has no complaints today. He denies any signs of infection. 5/31; the left-sided wound looks some better measure smaller surface granulation looks better. We have been using silver alginate under the total contact cast oo The large area on his right fifth met head and right dorsal foot look about the same still using silver alginate 6/6; neither side is good as I was hoping although the surface area dimensions are  better. A lot of maceration on his left and right foot around the wound edge. Area on the dorsal right foot looks better. He says he was traveling. I am not sure what does the amount of maceration around the plantar wounds may be drainage issues 6/13; in general the wound surfaces look quite good on both sides. Macerated skin and raised edges around the wound required debridement although in general especially on the left the surface area seems improved. oo The area on the right dorsal ankle is about the same I thought this would not be such a problem to close 6/20; not much change in either wound although the one on the right looks a little better. Both wounds have thick macerated edges to the skin requiring debridements. We have been using silver alginate. The area on the dorsal right ankle is still open I thought this would be closed. 6/28; patient comes in today with a marked deterioration in the right foot wound fifth met head. Wide area of exposed bone this is a drastic change from last time. The area on the left there we have been casting is stagnant. We have been using silver alginate in both wound areas. 7/5; bone culture I did for PCR last time was positive for Pseudomonas, group B strep, Enterococcus and Staph aureus. There was no suggestion of methicillin resistance or ampicillin resistant genes. This was resistant to tetracycline however He comes into the clinic today with the area over his right plantar fifth metatarsal head which had been doing so well 2 weeks ago completely necrotic feeling bone. I do not know that this is going to be salvageable. The left foot wound is certainly no smaller but it has a better surface and is superficial. 7/8; patient called in this morning to say that his total contact cast was rubbing against his foot. He states he is doing fine overall. He denies signs of infection. 7/12; continued deterioration in the wound over the right fifth metatarsal head  crumbling bone. This is not going to be salvageable. The patient agrees and wants to be referred to Dr. Doran Durand which we will attempt to arrange as soon as possible. I am going to continue him on antibiotics as long as that takes so I will renew those today. The area on the left foot which is the base of the fifth metatarsal continues to look somewhat better. Healthy looking tissue no depth no debridement is necessary here. 7/20; the patient was kindly seen by Dr. Doran Durand of orthopedics  on 10/19/2020. 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 Towards this end we have taken him out of the total contact cast on the left we will put him in a wrap with Hydrofera Blue. As I understand things surgery is planned for 7/21 7/27; patient had his surgery last Thursday. 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 Apparently everything went well we did not still disturb that today The area on the left foot actually looks quite good. He has been much less mobile which probably explains this he did not seem to do well in the total contact cast secondary to drainage and maceration I think. 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 Fortunately there is no evidence of active infection at this time. No fevers, chills, nausea, vomiting, or diarrhea. Overall I think that he is actually doing extremely well. Nonetheless I do believe that he is staying off of this more following the surgery in his right foot that is the reason the left is doing so great. 8/16; left plantar foot wound. This looks smaller than the last time I saw this he is using Hydrofera Blue. The surgical wound on the right foot is being followed by Dr. Doran Durand we did not look at this today. He has surgical shoes on both feet 8/23; left plantar foot wound not  as good this week. Surrounding macerated skin and subcutaneous tissue everything looks moist and wet. I do not think he is offloading this adequately. He is using a surgical shoe [name] [name] ([id number]) [id number] Page 16 of 22 Apparently the right foot surgical wound is not open although I did not check his foot 8/31; left plantar foot lateral aspect. Much improved this week. He has no maceration. Some improvement in the surface area of the wound but most impressively the depth is come in we are using silver alginate. The patient is a Product/process development scientist. He is asked that we write him a letter so he can go back to work. I have also tried to see if we can write something that will allow him to limit the amount of time that he is on his foot at work. 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 Hopefully the latter can be avoided 9/6; I believe the patient missed an appointment last week. He arrives in today with a wound looking roughly the same certainly no better. Undermining laterally and also inferiorly. We used molecuLight today in training with the patient's permission.. We are using silver alginate 9/21 wound is measuring bigger this week although this may have to do with the aggressive circumferential debridement last week in response to the blush fluorescence on the MolecuLight. Culture I did last week showed significant MSSA and E. coli. I put him on Augmentin but he has not started it yet. We are also going to send this for compounded antibiotics at [facility]. There is no evidence of systemic infection 9/29; silver alginate. His Keystone arrived. He is completing Augmentin in 2 days. Offloading in a cam boot. Moderate drainage per our intake staff 10/5; using silver alginate. He has been using his [place]. He has completed his Augmentin. Per our intake nurse still a lot of  drainage, far too much to consider a total contact cast. Wound measures about the same. He had the same undermining area that I defined last week from a roughly 11-3. I remove this today 10/12; using silver alginate he is using the [place]. He comes in for a nurse visit hence we are applying [employer] twice a week. Measuring slightly better today and less notable drainage. Extensive debridement of the wound edge last time 10/18; using topical Keystone and silver alginate and a soft cast. Wound measurements about the same. Drainage was through his soft cast. We are changing this twice a week Tuesdays and Friday 10/25; comes in with moderate drainage. Still using Keystone silver alginate and a soft cast. Wound dimensions completely the same.He has a lot of edema in the left leg he has lymphedema. Asking for [name] to consider wrapping him as he cannot get his stocking on over the soft cast 11/2; comes in with moderate to large drainage slightly smaller in terms of width we have been using [place]. His wound looks satisfactory but not much improvement 11/4; patient presents today for obligatory cast change. Has no issues or complaints today. He denies signs of infection. 11/9; patient traveled this weekend to [place], was on the cast quite a bit. Staining of the cast with black material from his walking boot. Drainage was not quite as bad as we feared. Using silver alginate and Keystone 11/16; we do not have size for cast therefore we have been putting a soft cast on him since the change on Friday. Still a significant amount of drainage necessitating changing twice a week. We have been using the Keystone at cast changes either hard or soft as well as silver alginate Comes in the clinic with things actually looking fairly good improvement in width. He says his offloading is about the same 02/24/2021 upon evaluation today patient actually comes back in and is doing excellent in regard to his foot ulcer this is  significantly smaller even compared to the last visit. The soft cast seems to have done extremely well for him which is great news. I do not see any signs of infection minimal debridement will be needed today. 11/30; left lateral foot much improved half a centimeter improvement in surface area. No evidence of infection. He seems to be doing better with the soft cast in the TCC therefore we will continue with this. He comes back in later in the week for a change with the nurses. This is due to drainage 12/6; no improvement in dimensions. Under illumination some debris on the surface we have been using silver alginate, soft cast. If there is anything optimistic here he seems to have have less drainage 12/13. Dimensions are improved both length and width and slightly in depth. Appears to be quite healthy today. Raised edges of this thick skin and callus around the edges however. He is in a soft cast were bringing him back once for a change on Friday. Drainage is better 12/20. Dimensions are improved. He still has raised edges of thick skin and callus around the edges. We are using a soft cast 12/28; comes in today with thick callus around the wound. Using silver under alginate under a soft cast. I do not think there is much improvement in any measurement 2023 04/06/2021; patient was put in a total contact cast. Unfortunately not much change in surface area 1/10; not much different still thick callus and skin around the edge in spite of the total contact cast. This was  just debrided last week we have been using the [facility] compounded antibiotic and silver alginate under a total contact cast 1/18 the patient's wound on the left side is doing nicely. smaller HOWEVER he comes in today with a wound on the right foot laterally. blister most likely serosangquenous drainage 1/24; the patient continues to do well in terms of the plantar left foot which is continued to contract using silver alginate under the  total contact cast HOWEVER the right lateral foot is bigger with denuded skin around the edges. I used pickups and a #15 scalpel to remove this this looks like the remanence of a large blister. Cannot rule out infection. Culture in this area I did last week showed Staphylococcus lugdunensis few colonies. I am going to try to address this with his [employer] antibiotic that is done so well on the left having linezolid and this should cover the staph 2/1; the patient's wound on his left foot which was the original plantar foot wound thick skin and eschar around the edges even in the total contact cast but the wound surface does not look too bad The real problem is on how his right lateral foot at roughly the base of the fifth metatarsal. The wound is completely necrotic more worrisome than that there is swelling around the edges of this. We have been using silver alginate on both wounds and Keystone on the right foot. Unfortunately I think he is going to require systemic antibiotics while we await cultures. He did not get the x-ray done that we ordered last week [lost the prescription 2/7; disappointingly in the area on the left foot which we are treating with a total contact cast is still not closed although it is much smaller. He continues to have a lot of callus around the wound edge. -Right lateral foot culture I did last week was negative x-ray also negative for osteomyelitis. 2/15: TCC silver alginate on the left and silver alginate on the right lateral. No real improvement in either area 05/26/2021: T oday, the wounds are roughly the same size as at his previous visit, post-debridement. He continues to endorse fairly substantial drainage, particularly on the right. He has been in a total contact cast on the left. There is still some callus surrounding this lesion. On the right, the periwound skin is quite macerated, along with surrounding callus. The center of the right-sided wound also has some  dark, densely adherent material, which is very difficult to remove. 06/02/2021: Today, both wounds are slightly smaller. He has been using zinc oxide ointment around the right ulcer and the degree of maceration has improved markedly. There continues to be an area of nonviable tissue in the center of the right sided ulcer. The left-sided wound, which has been in the total contact cast. Appears clean and the degree of callus around it is less than previously. 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 He had to take the stairs and both wounds have increased in size. The left foot, which has been in a total contact cast, has developed a tunnel tracking to the lateral aspect of his foot. The nonviable tissue in the center of the right-sided ulcer remains recalcitrant to debridement. There is significant undermining surrounding the entirety of the left sided wound. 06/16/2021: The elevator at school has been fixed and the patient has been able to avoid putting as much weight on his wounds over the past week. We converted the  left foot wound into a single lesion today, but despite this, the wound is actually smaller. The base is healthy with limited periwound callus. On the right, the central necrotic area is still present. He continues to be quite macerated around the right sided wound, despite applying barrier cream. This does, however, have the benefit of softening the callus to make it more easily removable. Joshua, [name] ([id number]) [id number] Page 17 of 22 06/23/2021: Today, the left wound is smaller. The lateral aspect that had opened up previously is now closed. The wound base has a healthy bed of granulation tissue and minimal slough. Unfortunately, on the right, the wound is larger and continues to be fairly macerated. He has also reopened the wound at his right ankle. He thinks this is due to the gait he  has adopted secondary to his total contact cast and boot. 06/30/2021: T oday, both wounds are a little bit larger. The lateral aspect on the left has remained closed. He continues to have significant periwound maceration. The culture that I took from the right sided wound grew a population of bacteria that is not covered by his current [facility] antibiotic. The center of the right- sided wound continues to appear necrotic with nonviable fat. It probes deeper today, but does not reach bone. 07/07/2021: The periwound maceration is a little bit less today. The right lateral foot wound has some areas that appear more viable and the necrotic center also looks a little bit better. The wound on the dorsal surface of his right foot near the ankle is contracting and the surface appears healthy. The left plantar wound surface looks healthy, but there is some new undermining on the medial portion. He did get his new Keystone antibiotic and began applying that to the right foot wound on Saturday. 07/14/2021: The intake nurse reported substantial drainage from his wounds, but the periwound skin actually looks better than is typical for him. The wound on the dorsal surface of his right foot near the ankle is smaller and just has a small open area underneath some dried eschar. The left plantar wound surface looks healthy and there has been no significant accumulation of callus. The right lateral foot wound looks quite a bit better, with the central portion, which typically appears necrotic, looking more viable albeit pale. 07/22/2021: His left foot is extremely macerated today. The wound is about the same size. The wound on the dorsal surface of his right foot near the ankle had closed, but he traumatized it removing the dressing and there is a tiny skin tear in that location. The right lateral foot wound is bigger, but the surface appears healthy. 07/30/2021: The wound on the dorsal surface of his right foot near the  ankle is closed. The right lateral foot wound again is a little bit bigger due to some undermining. The periwound skin is in better condition, however. He has been applying zinc oxide. The wound surface is a little bit dry today. On the left, he does not have the substantial maceration that we frequently see. The wound itself is smaller and has a clean surface. 08/06/2021: Both wounds seem to have deteriorated over the past week. The right lateral foot wound has a dry surface but the periwound is boggy.. Overall wound dimensions are about the same. On the left, the wound is about the same size, but there is more undermining present underneath periwound callus. 08/13/2021: The right sided wound looks about the same, but on the left there has been  substantial deterioration. The undermining continues to extend under periwound callus. Once this was removed, substantial extension of the wound was present. There is no odor or purulent drainage but clearly the wounds have broken down. 08/20/2021: The wounds look about the same today. He has been out of his total contact cast and has just been changing the dressings using topical Keystone with PolyMem Ag, Kerlix and Ace bandages. The wound on the top of his right ankle has reopened but this is quite small. There was a little bit of purulent material that I expressed when examining this wound. 08/24/2021: After the aggressive debridement I performed at his last visit, the wounds actually look a little bit better today. They are smaller with the exception of the wound on the top of his right ankle which is a little bit bigger as some more skin pulled off when he was changing his dressing. We are using topical Keystone with PolyMem Ag Kerlix and Ace bandages. 09/02/2021: There has been really no change to any of his wounds. 09/16/2021: The patient was hospitalized last week with nausea, vomiting, and dehydration. He says that while he was in the hospital, his wounds  were not really addressed properly. T oday, both plantar foot wounds are larger and the periwound skin is macerated. The wound on the dorsum of his right foot has a scab on the top. The right foot now has a crater where previously he had had nonviable fat. It looks as though this simply died and fell out. The periwound callus is wet. 09/24/2021: His wounds have deteriorated somewhat since his last visit. The wound on the dorsum of his right foot near his ankle is larger and has more nonviable tissue present. The crater in his right foot is even deeper; I cannot quite palpate or probe to bone but I am sure it is close. The wound on his left plantar foot has an odd boggy area in the center that almost feels as though it has fluid within it. He has run out of his topical Keystone antibiotic. We are using silver alginate on his wounds. 09/29/2021: He has developed a new wound on the dorsum of his left foot near his ankle. He says he thinks his wrapping is rubbing in that site. I would concur with this as the wound on his right ankle is larger. The left foot looks about the same. The right foot has the crater that was present last week. No significant slough accumulation, but his foot remains quite swollen and warm despite oral antibiotic therapy. 10/08/2021: All of his wounds look about the same as last week. He did not start his oral antibiotics that are prescribed until just a couple of days ago; his [employer] compounded antibiotics formula has been changed and he is awaiting delivery of the new recipe. His MRI that was scheduled for earlier this week was canceled as no prior authorization had been obtained; unfortunately the tech responsible sent an email to my old [place] email, which I no longer use nor have access to. 10/18/2021: The wounds on his bilateral dorsal feet near the ankles are both improved. They are smaller and have just some eschar and slough buildup. The left plantar wound has a fair  amount of undermining, but the surface is clean. There is some periwound callus accumulation. On the right plantar foot, there is nonviable fat leading to a deep tunnel that tracks towards his dorsal medial foot. There is periwound callus and slough accumulation, as well.  His right foot and leg remain swollen as compared to the left. 10/25/2021: The wounds on his bilateral dorsal feet and at the ankles have broken down somewhat. They are little bit larger than last week. The left plantar wound continues to undermine laterally but the surface is clean. The right plantar foot wound shows some decreased depth in the tunnel tracking towards his dorsal medial foot. He has not yet had the Doppler study that I ordered; it sounds like there is some confusion about the scheduling of the procedure. In addition, the MRI was denied and I have taken steps to appeal the denial. 11/24/2021: Since our last visit, Mr. Reish was admitted to the hospital where an MRI suggested osteomyelitis. He was taken the operating room by podiatry. Bone biopsies were negative for osteomyelitis. They debrided his wounds and applied myriad matrix. He saw them last week and they removed his staples. He is here today to continue his wound healing process. T oday, both of the dorsal foot/ankle wounds are substantially smaller. There is just a little eschar overlying the left sided wound and some eschar and slough on the right. The right plantar foot ulcer has the healthiest surface of granulation tissue that I have seen to date. A portion of the myriad matrix failed to take and was hanging loose. It appears that myriad morcells were placed into the tunnel closest to the dorsal portion of his foot. These have sloughed off. The left plantar foot ulcer is about the same size, but has a much healthier surface than in the past. Both plantar ulcers have callus and slough accumulation. 12/07/2021: Left dorsal foot/ankle wound is closed. The  right dorsal foot/ankle wound is nearly closed and just has a small open area with some eschar and slough. The right plantar foot wound has contracted quite a bit since our last visit. It has a healthy surface with just a little bit of slough accumulation and periwound callus buildup. The left plantar foot wound is about the same size but the surface appears healthy. There is a little slough and periwound callus on this side, as well. 12/15/2021: Both dorsal foot/ankle wounds are closed. The right plantar foot wound is substantially smaller than at our last visit. The tunneling that was present has nearly closed. There is just a little bit of slough buildup. The left plantar foot wound is also a little bit smaller today. The surface is the healthiest that I have ever seen it. Light slough and periwound callus accumulation on this side. 12/22/2021: The dorsal ankle/foot wounds remain closed. The right plantar foot wound continues to contract. There is still a bit of depth at the lateral portion of the wound but the surface has a good granulated appearance. The wound on the left is about the same size, the central indented portion is still adherent. It also is clean with good granulation tissue present. The periwound skin and callus are a bit macerated, however. [name] [name] ([id number]) [id number] Page 18 of 22 12/29/2021: Both ankle wounds remain closed. The right plantar foot wound is less than half the size that it was last week. There is no depth any longer and the surface has just a little bit of slough and periwound callus. On the left, the wound is not much smaller, but the surface is healthier with good granulation tissue. There is also a little slough and periwound callus buildup. 01/05/2022: The right plantar foot wound continues to contract and is once again about half the size as  it was last week. There is just a little bit of surface slough and periwound callus.  On the left, the depth of the wound has decreased and the diameter has started to contract. It is also very clean with just a little slough and biofilm present. 01/12/2022: The right plantar foot wound is smaller again today. There is just some periwound callus and slough accumulation. On the left, there is a fair amount of undermining and the surface is a little boggy, but no other significant change. 01/19/2022: The right plantar wound continues to contract. There is minimal periwound callus accumulation and just a light layer of slough on the surface. On the left, the surface looks better, but there is fairly significant undermining near the 11:00 portion of the wound, aiming towards his great toe. The skin overlying this area is very healthy and has a good fat layer present. No malodor or purulent drainage. Patient History Information obtained from Patient. Family History No family history of Cancer, Diabetes, Hereditary Spherocytosis, Hypertension, Kidney Disease, Lung Disease, Seizures, Stroke, Thyroid Problems, Tuberculosis. Social History Never smoker, Marital Status - Single, Alcohol Use - Rarely, Drug Use - No History, Caffeine Use - Never. Medical History Eyes Denies history of Cataracts, Glaucoma, Optic Neuritis Ear/Nose/Mouth/Throat Denies history of Chronic sinus problems/congestion, Middle ear problems Hematologic/Lymphatic Denies history of Anemia, Hemophilia, Human Immunodeficiency Virus, Lymphedema, Sickle Cell Disease Respiratory Denies history of Aspiration, Asthma, Chronic Obstructive Pulmonary Disease (COPD), Pneumothorax, Sleep Apnea, Tuberculosis Cardiovascular Denies history of Angina, Arrhythmia, Congestive Heart Failure, Coronary Artery Disease, Deep Vein Thrombosis, Hypertension, Hypotension, Myocardial Infarction, Peripheral Arterial Disease, Peripheral Venous Disease, Phlebitis, Vasculitis Gastrointestinal Denies history of Cirrhosis , Colitis, Crohnoos,  Hepatitis A, Hepatitis B, Hepatitis C Endocrine Patient has history of Type II Diabetes Denies history of Type I Diabetes Immunological Denies history of Lupus Erythematosus, Raynaudoos, Scleroderma Integumentary (Skin) Denies history of History of Burn Musculoskeletal Denies history of Gout, Rheumatoid Arthritis, Osteoarthritis, Osteomyelitis Neurologic Denies history of Dementia, Neuropathy, Quadriplegia, Paraplegia, Seizure Disorder Oncologic Denies history of Received Chemotherapy, Received Radiation Psychiatric Denies history of Anorexia/bulimia, Confinement Anxiety Hospitalization/Surgery History - 11/1-11/06/2018- sepsis foot infection. - 11/4-11/5 02 sats low respiratory distress. Objective Constitutional No acute distress.. Vitals Time Taken: 8:20 AM, Height: 77 in, Weight: 280 lbs, BMI: 33.2, Temperature: 98.7 F, Pulse: 88 bpm, Respiratory Rate: 20 breaths/min, Blood Pressure: 132/77 mmHg, Capillary Blood Glucose: 113 mg/dl. General Notes: glucose per pt report this am Respiratory Normal work of breathing on room air.. General Notes: 01/19/2022: The right plantar wound continues to contract. There is minimal periwound callus accumulation and just a light layer of slough on the surface. On the left, the surface looks better, but there is fairly significant undermining near the 11:00 portion of the wound, aiming towards his great toe. The skin overlying this area is very healthy and has a good fat layer present. No malodor or purulent drainage. Integumentary (Hair, Skin) Wound #3 status is Open. Original cause of wound was Trauma. The date acquired was: 10/02/2019. The wound has been in treatment 116 weeks. The wound is located on the [place]. The wound measures 3cm length x 3.6cm width x 0.4cm depth; 8.482cm^2 area and 3.393cm^3 volume. There is Fat Layer (Subcutaneous Tissue) exposed. There is no tunneling noted, however, there is undermining starting at  3:00 and ending at 6:00 with a maximum distance Moodie, [name] ([id number]) [id number] Page 19 of 22 of 2.7cm. There is a medium amount of serosanguineous drainage noted. The wound margin is well defined  and not attached to the wound base. There is large (67-100%) red, hyper - granulation within the wound bed. There is a small (1-33%) amount of necrotic tissue within the wound bed including Adherent Slough. The periwound skin appearance had no abnormalities noted for moisture. The periwound skin appearance had no abnormalities noted for color. The periwound skin appearance exhibited: Callus. Periwound temperature was noted as No Abnormality. Wound #8 status is Open. Original cause of wound was Blister. The date acquired was: 04/18/2021. The wound has been in treatment 39 weeks. The wound is located on the Right,Lateral Foot. The wound measures 0.7cm length x 1cm width x 0.1cm depth; 0.55cm^2 area and 0.055cm^3 volume. There is Fat Layer (Subcutaneous Tissue) exposed. There is a medium amount of serosanguineous drainage noted. The wound margin is well defined and not attached to the wound base. There is large (67-100%) red, pink granulation within the wound bed. There is a small (1-33%) amount of necrotic tissue within the wound bed including Adherent Slough. The periwound skin appearance had no abnormalities noted for moisture. The periwound skin appearance had no abnormalities noted for color. The periwound skin appearance exhibited: Callus. Periwound temperature was noted as No Abnormality. 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 There was a Selective/Open Wound Skin/Epidermis Debridement with a total area of 10.8 sq cm performed by [name], MD. With the following instrument(s): Curette to remove Non-Viable tissue/material. Material removed includes Callus, Slough, and Skin: Epidermis. No specimens were taken. A time out was conducted at 09:20, prior to the start of the procedure. A Minimum amount of bleeding was controlled with Pressure. The procedure was tolerated well with a pain level of 0 throughout and a pain level of 0 following the procedure. Post Debridement Measurements: 3cm length x 3.6cm width x 0.4cm depth; 3.393cm^3 volume. Character of Wound/Ulcer Post Debridement is improved. Severity of Tissue Post Debridement is: Fat layer exposed. Post procedure Diagnosis Wound #3: Same as Pre-Procedure General Notes: scribed by Baruch Gouty, RN for Dr. [name]. Wound #8 Pre-procedure diagnosis of Wound #8 is a Diabetic Wound/Ulcer of the Lower Extremity located on the Right,Lateral Foot .Severity of Tissue Pre Debridement is: Fat layer exposed. There was a Selective/Open Wound Skin/Epidermis Debridement with a total area of 0.7 sq cm performed by [name], MD. With the following instrument(s): Curette to remove Non-Viable tissue/material. Material removed includes Callus, Slough, and Skin: Epidermis after achieving pain control using Lidocaine 4% T opical Solution. No specimens were taken. A time out was conducted at 09:20, prior to the start of the procedure. A Minimum amount of bleeding was controlled with Pressure. The procedure was tolerated well with a pain level of 0 throughout and a pain level of 0 following the procedure. Post Debridement Measurements: 0.7cm length x 1cm width x 0.1cm depth; 0.055cm^3 volume. Character of Wound/Ulcer Post Debridement is improved. Severity of Tissue Post Debridement is: Fat layer  exposed. Post procedure Diagnosis Wound #8: Same as Pre-Procedure General Notes: scribed by Baruch Gouty, RN for Dr. [name]. Plan Follow-up Appointments: Return Appointment in 1 week. - Dr. [name] - Room 1 with Delice Lesch. 10/25 Bathing/ Shower/ Hygiene: May shower  and wash wound with soap and water. Edema Control - Lymphedema / SCD / Other: Elevate legs to the level of the heart or above for 30 minutes daily and/or when sitting, a frequency of: - throughout the day Avoid standing for long periods of time. Exercise regularly Compression stocking or Garment 20-30 mm/Hg pressure to: - to both legs daily Off-Loading: Open toe surgical shoe to: - Both feet Additional Orders / Instructions: Follow Nutritious Diet WOUND #3: - Foot Wound Laterality: Plantar, Left, Lateral Cleanser: Soap and Water 1 x Per Day/30 Days Discharge Instructions: May shower and wash wound with dial antibacterial soap and water prior to dressing change. Peri-Wound Care: Zinc Oxide Ointment 30g tube 1 x Per Day/30 Days Discharge Instructions: Apply Zinc Oxide to periwound with each dressing change Peri-Wound Care: Sween Lotion (Moisturizing lotion) 1 x Per Day/30 Days Discharge Instructions: Apply moisturizing lotion as directed Prim Dressing: Hydrofera Blue Ready Foam, 4x5 in (Generic) 1 x Per Day/30 Days ary Discharge Instructions: Apply to wound bed as instructed Secondary Dressing: ABD Pad, 8x10 (DME) (Generic) 1 x Per Day/30 Days Discharge Instructions: Apply over primary dressing as directed. Secondary Dressing: Optifoam Non-Adhesive Dressing, 4x4 in (DME) (Generic) 1 x Per Day/30 Days Discharge Instructions: Apply over primary dressing as directed. Secondary Dressing: Woven Gauze Sponge, Non-Sterile 4x4 in (DME) (Generic) 1 x Per Day/30 Days Discharge Instructions: Apply over primary dressing as directed. Secondary Dressing: Zetuvit Absorbent Pad, 4x4 (in/in) (DME) (Dispense As Written) 1 x Per Day/30  Days [name] [name] ([id number]) [id number] Page 20 of 22 Secured With: 97M Medipore H Soft Cloth Surgical T ape, 4 x 10 (in/yd) (DME) (Dispense As Written) 1 x Per Day/30 Days Discharge Instructions: Secure with tape as directed. Com pression Wrap: Kerlix Roll 4.5x3.1 (in/yd) (DME) (Generic) 1 x Per Day/30 Days Discharge Instructions: Apply Kerlix and Coban compression as directed. Com pression Wrap: 97M ACE Elastic Bandage With VELCRO Brand Closure, 4 (in) (DME) (Generic) 1 x Per Day/30 Days WOUND #8: - Foot Wound Laterality: Right, Lateral Cleanser: Soap and Water 1 x Per Day/30 Days Discharge Instructions: May shower and wash wound with dial antibacterial soap and water prior to dressing change. Peri-Wound Care: Sween Lotion (Moisturizing lotion) 1 x Per Day/30 Days Discharge Instructions: Apply moisturizing lotion as directed Prim Dressing: Promogran Prisma Matrix, 4.34 (sq in) (silver collagen) (DME) (Generic) 1 x Per Day/30 Days ary Discharge Instructions: Moisten collagen with saline or hydrogel Secondary Dressing: ABD Pad, 8x10 (DME) (Generic) 1 x Per Day/30 Days Discharge Instructions: Apply over primary dressing as directed. Secondary Dressing: Optifoam Non-Adhesive Dressing, 4x4 in (DME) (Generic) 1 x Per Day/30 Days Discharge Instructions: Apply over primary dressing as directed. Secondary Dressing: Woven Gauze Sponge, Non-Sterile 4x4 in (DME) (Generic) 1 x Per Day/30 Days Discharge Instructions: Apply over primary dressing as directed. Secondary Dressing: Zetuvit Absorbent Pad, 4x4 (in/in) (DME) (Dispense As Written) 1 x Per Day/30 Days Secured With: 97M Medipore H Soft Cloth Surgical T ape, 4 x 10 (in/yd) (DME) (Dispense As Written) 1 x Per Day/30 Days Discharge Instructions: Secure with tape as directed. Com pression Wrap: Kerlix Roll 4.5x3.1 (in/yd) (DME) (Generic) 1 x Per Day/30 Days Discharge Instructions: Apply Kerlix and Coban compression as  directed. Com pression Wrap: 97M ACE Elastic Bandage With VELCRO Brand Closure, 4 (in) (DME) (Generic) 1 x Per Day/30 Days 01/19/2022: The right plantar wound continues to contract. There is minimal periwound callus accumulation and just a light layer of slough on the surface. On the left, the  surface looks better, but there is fairly significant undermining near the 11:00 portion of the wound, aiming towards his great toe. The skin overlying this area is very healthy and has a good fat layer present. No malodor or purulent drainage. I used a curette to debride slough and periwound callus from his wounds. Because of the good quality of the skin overlying the undermined portion of his left foot wound, I do not want to remove this. We will have him pack that area with the [facility] that he is already using on that side. Continue Prisma on the right. Continue offloading. Follow-up in 1 week. Electronic Signature(s) Signed: 01/19/2022 9:50:34 AM By: [name] MD FACS Entered By: [name] on 01/19/2022 09:50:34 -------------------------------------------------------------------------------- HxROS Details Patient Name: Date of Service: [name] [name], [name] [name] 01/19/2022 8:15 A M Medical Record Number: [id number] Patient Account Number: [id number] Date of Birth/Sex: Treating RN: Jul 29, 1985 (36 y.o. M) Primary Care Provider: [name] Other Clinician: Referring Provider: Treating Provider/Extender: [name] in Treatment: 116 Information Obtained From Patient Eyes Medical History: Negative for: Cataracts; Glaucoma; Optic Neuritis Ear/Nose/Mouth/Throat Medical History: Negative for: Chronic sinus problems/congestion; Middle ear problems Hematologic/Lymphatic Medical History: Negative for: Anemia; Hemophilia; Human Immunodeficiency Virus; Lymphedema; Sickle Cell Disease Respiratory Medical History: Negative for: Aspiration; Asthma; Chronic  Obstructive Pulmonary Disease (COPD); Pneumothorax; Sleep Apnea; Tuberculosis Schoof, [name] ([id number]) [id number] Page 21 of 22 Cardiovascular Medical History: Negative for: Angina; Arrhythmia; Congestive Heart Failure; Coronary Artery Disease; Deep Vein Thrombosis; Hypertension; Hypotension; Myocardial Infarction; Peripheral Arterial Disease; Peripheral Venous Disease; Phlebitis; Vasculitis Gastrointestinal Medical History: Negative for: Cirrhosis ; Colitis; Crohns; Hepatitis A; Hepatitis B; Hepatitis C Endocrine Medical History: Positive for: Type II Diabetes Negative for: Type I Diabetes Time with diabetes: 8 Treated with: Insulin Blood sugar tested every day: No Immunological Medical History: Negative for: Lupus Erythematosus; Raynauds; Scleroderma Integumentary (Skin) Medical History: Negative for: History of Burn Musculoskeletal Medical History: Negative for: Gout; Rheumatoid Arthritis; Osteoarthritis; Osteomyelitis Neurologic Medical History: Negative for: Dementia; Neuropathy; Quadriplegia; Paraplegia; Seizure Disorder Oncologic Medical History: Negative for: Received Chemotherapy; Received Radiation Psychiatric Medical History: Negative for: Anorexia/bulimia; Confinement Anxiety Immunizations Pneumococcal Vaccine: Received Pneumococcal Vaccination: No Implantable Devices None Hospitalization / Surgery History Type of Hospitalization/Surgery 11/1-11/06/2018- sepsis foot infection 11/4-11/5 02 sats low respiratory distress Family and Social History Cancer: No; Diabetes: No; Hereditary Spherocytosis: No; Hypertension: No; Kidney Disease: No; Lung Disease: No; Seizures: No; Stroke: No; Thyroid Problems: No; Tuberculosis: No; Never smoker; Marital Status - Single; Alcohol Use: Rarely; Drug Use: No History; Caffeine Use: Never; Financial Concerns: No; Food, Clothing or Shelter Needs: No; Support System Lacking: No; Transportation  Concerns: No Electronic Signature(s) Signed: 01/19/2022 12:35:35 PM By: [name] MD FACS Entered By: [name] on 01/19/2022 09:48:09 [name] [name] ([id number]) [id number] Page 22 of 22 -------------------------------------------------------------------------------- SuperBill Details Patient Name: Date of Service: [name] 01/19/2022 Medical Record Number: [id number] Patient Account Number: [id number] Date of Birth/Sex: Treating RN: [birth year] (36 y.o. M) Primary Care Provider: [name] Other Clinician: Referring Provider: Treating Provider/Extender: [name] in Treatment: 116 Diagnosis Coding ICD-10 Codes Code Description E11.621 Type 2 diabetes mellitus with foot ulcer L97.528 Non-pressure chronic ulcer of other part of left foot with other specified severity L97.518 Non-pressure chronic ulcer of other part of right foot with other specified severity Facility Procedures : CPT4 Code: [id number] Description: 80881 - DEBRIDE WOUND 1ST 20 SQ CM OR < ICD-10 Diagnosis Description L97.528 Non-pressure chronic ulcer of other part  of left foot with other specified severi L97.518 Non-pressure chronic ulcer of other part of right foot with other  specified sever Modifier: ty ity Quantity: 1 Physician Procedures : CPT4 Code Description Modifier [id number] - WC PHYS LEVEL 4 - EST PT 25 ICD-10 Diagnosis Description L97.528 Non-pressure chronic ulcer of other part of left foot with other specified severity L97.518 Non-pressure chronic ulcer of other part of  right foot with other specified severity E11.621 Type 2 diabetes mellitus with foot ulcer Quantity: 1 : [id number] - WC PHYS DEBR WO ANESTH 20 SQ CM ICD-10 Diagnosis Description L97.528 Non-pressure chronic ulcer of other part of left foot with other specified severity L97.518 Non-pressure chronic ulcer of other part of right foot with other  specified  severity Quantity: 1 Electronic Signature(s) Signed: 01/19/2022 9:51:09 AM By: [name] MD FACS Entered By: [name] on 01/19/2022 09:51:07

## 2022-01-21 NOTE — Progress Notes (Signed)
[place], [name] ([id number]) [id number] Page 1 of 9 Visit Report for 01/19/2022 Arrival Information Details Patient Name: Date of Service: [name] 01/19/2022 8:15 A M Medical Record Number: [id number] Patient Account Number: [id number] Date of Birth/Sex: Treating RN: 1985-10-15 (36 y.o. M) Primary Care Avyn Aden: [name] Other Clinician: Referring Anairis Knick: Treating Ilhan Debenedetto/Extender: [name] in Treatment: 73 Visit Information History Since Last Visit All ordered tests and consults were completed: No Patient Arrived: Ambulatory Added or deleted any medications: No Arrival Time: 08:23 Any new allergies or adverse reactions: No Transfer Assistance: None Had a fall or experienced change in No Patient Identification Verified: Yes activities of daily living that may affect Secondary Verification Process Completed: Yes risk of falls: Patient Requires Transmission-Based Precautions: No Signs or symptoms of abuse/neglect since No Patient Has Alerts: No last visito Hospitalized since last visit: No Implantable device outside of the clinic No excluding cellular tissue based products placed in the center since last visit: Has Dressing in Place as Prescribed: Yes Has Compression in Place as Prescribed: Yes Has Footwear/Offloading in Place as Yes Prescribed: Left: Surgical Shoe with Pressure Relief Insole Right: Surgical Shoe with Pressure Relief Insole Pain Present Now: No Electronic Signature(s) Signed: 01/19/2022 5:44:27 PM By: Baruch Gouty RN, BSN Entered By: Baruch Gouty on 01/19/2022 08:57:30 -------------------------------------------------------------------------------- Encounter Discharge Information Details Patient Name: Date of Service: [name], [name] 01/19/2022 8:15 A M Medical Record Number: [id number] Patient Account Number: [id number] Date of Birth/Sex: Treating RN: 1985/06/13 (36 y.o.  [name] Primary Care Siaosi Alter: [name] Other Clinician: Referring Vivian Neuwirth: Treating Chivonne Rascon/Extender: [name] in Treatment: [id number] Encounter Discharge Information Items Post Procedure Vitals Discharge Condition: Stable Temperature (F): 98.7 Ambulatory Status: Ambulatory Pulse (bpm): 88 Discharge Destination: Home Respiratory Rate (breaths/min): 18 Transportation: Private Auto Blood Pressure (mmHg): 132/77 Accompanied By: self Schedule Follow-up Appointment: Yes Clinical Summary of Care: Patient Declined Electronic Signature(s) Signed: 01/19/2022 5:44:27 PM By: Baruch Gouty RN, BSN [place], [name] ([id number]) PM By: Baruch Gouty RN, BSN [phone].pdf Page 2 of 9 Signed: 01/19/2022 5:44:27 Entered By: Baruch Gouty on 01/19/2022 17:16:52 -------------------------------------------------------------------------------- Lower Extremity Assessment Details Patient Name: Date of Service: [name] 01/19/2022 8:15 A M Medical Record Number: [id number] Patient Account Number: [id number] Date of Birth/Sex: Treating RN: [birth year] (36 y.o. [name] Primary Care Thorin Starner: [name] Other Clinician: Referring Jamall Strohmeier: Treating Johnelle Tafolla/Extender: [name] in Treatment: 116 Edema Assessment Assessed: [name]: No] [name]: No] Edema: [Left: Yes] [Right: Yes] Calf Left: Right: Point of Measurement: 48 cm From Medial Instep 47 cm 46.5 cm Ankle Left: Right: Point of Measurement: 11 cm From Medial Instep 27 cm 27 cm Electronic Signature(s) Signed: 01/19/2022 5:44:27 PM By: Baruch Gouty RN, BSN Entered By: Baruch Gouty on 01/19/2022 08:58:50 -------------------------------------------------------------------------------- Multi Wound Chart Details Patient Name: Date of Service: [name], [name] 01/19/2022 8:15 A M Medical Record Number: [id number] Patient  Account Number: [id number] Date of Birth/Sex: Treating RN: 07/21/1985 (36 y.o. M) Primary Care Bernardo Brayman: [name] Other Clinician: Referring Inman Fettig: Treating Celine Dishman/Extender: [name] in Treatment: 116 Vital Signs Height(in): 77 Capillary Blood Glucose(mg/dl): 113 Weight(lbs): 280 Pulse(bpm): 88 Body Mass Index(BMI): 33.2 Blood Pressure(mmHg): 132/77 Temperature(F): 98.7 Respiratory Rate(breaths/min): 20 [3:Photos:] [N/A:N/A] Left, Lateral, Plantar Foot Right, Lateral Foot N/A Wound Location: [name], [name] ([id number]) [id number] Page 3 of 9 Trauma Blister N/A Wounding Event: Diabetic Wound/Ulcer of the Lower Diabetic Wound/Ulcer of the Lower N/A Primary  Etiology: Extremity Extremity Type II Diabetes Type II Diabetes N/A Comorbid History: 10/02/2019 04/18/2021 N/A Date Acquired: 116 89 N/A Weeks of Treatment: Open Open N/A Wound Status: No No N/A Wound Recurrence: 3x3.6x0.4 0.7x1x0.1 N/A Measurements L x W x [name] 8.482 0.55 N/A A (cm) : rea 3.393 0.055 N/A Volume (cm) : -414.40% 0.00% N/A % Reduction in A rea: -1956.40% 50.00% N/A % Reduction in Volume: 3 Starting Position 1 (o'clock): 6 Ending Position 1 (o'clock): 2.7 Maximum Distance 1 (cm): Yes N/A N/A Undermining: Grade 2 Grade 2 N/A Classification: Medium Medium N/A Exudate A mount: Serosanguineous Serosanguineous N/A Exudate Type: red, brown red, brown N/A Exudate Color: Well defined, not attached Well defined, not attached N/A Wound Margin: Large (67-100%) Large (67-100%) N/A Granulation A mount: Red, Hyper-granulation Red, Pink N/A Granulation Quality: Small (1-33%) Small (1-33%) N/A Necrotic A mount: Fat Layer (Subcutaneous Tissue): Yes Fat Layer (Subcutaneous Tissue): Yes N/A Exposed Structures: Fascia: No Fascia: No Tendon: No Tendon: No Muscle: No Muscle: No Joint: No Joint: No Bone: No Bone: No Small  (1-33%) Small (1-33%) N/A Epithelialization: Debridement - Selective/Open Wound Debridement - Selective/Open Wound N/A Debridement: Pre-procedure Verification/Time Out 09:20 09:20 N/A Taken: N/A Lidocaine 4% T opical Solution N/A Pain Control: Callus, Slough Callus, Slough N/A Tissue Debrided: Skin/Epidermis Skin/Epidermis N/A Level: 10.8 0.7 N/A Debridement A (sq cm): rea Curette Curette N/A Instrument: Minimum Minimum N/A Bleeding: Pressure Pressure N/A Hemostasis A chieved: 0 0 N/A Procedural Pain: 0 0 N/A Post Procedural Pain: Procedure was tolerated well Procedure was tolerated well N/A Debridement Treatment Response: 3x3.6x0.4 0.7x1x0.1 N/A Post Debridement Measurements L x W x [name] 3.393 0.055 N/A Post Debridement Volume: (cm) Callus: Yes Callus: Yes N/A Periwound Skin Texture: No Abnormalities Noted No Abnormalities Noted N/A Periwound Skin Moisture: No Abnormalities Noted No Abnormalities Noted N/A Periwound Skin Color: No Abnormality No Abnormality N/A Temperature: Debridement Debridement N/A Procedures Performed: Treatment Notes Electronic Signature(s) Signed: 01/19/2022 9:24:29 AM By: [name] MD FACS Entered By: [name] on 01/19/2022 09:24:29 -------------------------------------------------------------------------------- Multi-Disciplinary Care Plan Details Patient Name: Date of Service: [name], [name] 01/19/2022 8:15 A M Medical Record Number: [id number] Patient Account Number: [id number] Date of Birth/Sex: Treating RN: Apr 21, 1985 (36 y.o. [name] Primary Care Cherryl Babin: [name] Other Clinician: Referring Rilley Stash: Treating Rylen Swindler/Extender: [name] in Treatment: [place] reviewed with physician [place], [name] ([id number]) [id number] Page 4 of 9 Active Inactive Nutrition Nursing Diagnoses: Imbalanced nutrition Potential  for alteratiion in Nutrition/Potential for imbalanced nutrition Goals: Patient/caregiver agrees to and verbalizes understanding of need to use nutritional supplements and/or vitamins as prescribed Date Initiated: 10/24/2019 Date Inactivated: 04/06/2020 Target Resolution Date: 04/03/2020 Goal Status: Met Patient/caregiver will maintain therapeutic glucose control Date Initiated: 10/24/2019 Target Resolution Date: 02/16/2022 Goal Status: Active Interventions: Assess HgA1c results as ordered upon admission and as needed Assess patient nutrition upon admission and as needed per policy Provide education on elevated blood sugars and impact on wound healing Provide education on nutrition Treatment Activities: Education provided on Nutrition : 11/24/2021 Notes: 11/17/20: Glucose control ongoing issue, target date extended. 01/26/21: Glucose management continues. Wound/Skin Impairment Nursing Diagnoses: Impaired tissue integrity Knowledge deficit related to ulceration/compromised skin integrity Goals: Patient/caregiver will verbalize understanding of skin care regimen Date Initiated: 10/24/2019 Target Resolution Date: 02/16/2022 Goal Status: Active Ulcer/skin breakdown will have a volume reduction of 30% by week 4 Date Initiated: 10/24/2019 Date Inactivated: 01/16/2020 Target Resolution Date: 01/10/2020 Unmet Reason: no change in Goal Status:  Unmet measurements. Interventions: Assess patient/caregiver ability to obtain necessary supplies Assess patient/caregiver ability to perform ulcer/skin care regimen upon admission and as needed Assess ulceration(s) every visit Provide education on ulcer and skin care Notes: 11/17/20: Wound care regimen continues Electronic Signature(s) Signed: 01/19/2022 5:44:27 PM By: Baruch Gouty RN, BSN Entered By: Baruch Gouty on 01/19/2022 09:07:55 -------------------------------------------------------------------------------- Pain Assessment  Details Patient Name: Date of Service: [name], [name] 01/19/2022 8:15 A M Medical Record Number: [id number] Patient Account Number: [id number] Date of Birth/Sex: Treating RN: Sep 09, 1985 (36 y.o. M) Primary Care Reinhart Saulters: [name] Other Clinician: Referring Felicha Frayne: Treating Treonna Klee/Extender: [name] in Treatment: [place], [name] ([id number]) [id number] Page 5 of 9 Location of Pain Severity and Description of Pain Patient Has Paino No Site Locations Rate the pain. Current Pain Level: 0 Pain Management and Medication Current Pain Management: Electronic Signature(s) Signed: 01/19/2022 5:44:27 PM By: Baruch Gouty RN, BSN Entered By: Baruch Gouty on 01/19/2022 08:58:06 -------------------------------------------------------------------------------- Patient/Caregiver Education Details Patient Name: Date of Service: [name], [name] 10/18/2023andnbsp8:15 A M Medical Record Number: [id number] Patient Account Number: [id number] Date of Birth/Gender: Treating RN: April 30, 1985 (36 y.o. [name] Primary Care Physician: [name] Other Clinician: Referring Physician: Treating Physician/Extender: [name] in Treatment: 116 Education Assessment Education Provided To: Patient Education Topics Provided Elevated Blood Sugar/ Impact on Healing: Methods: Explain/Verbal Responses: Reinforcements needed, State content correctly Offloading: Methods: Explain/Verbal Responses: Reinforcements needed, State content correctly Wound/Skin Impairment: Methods: Explain/Verbal Responses: Reinforcements needed, State content correctly Electronic Signature(s) Signed: 01/19/2022 5:44:27 PM By: Baruch Gouty RN, BSN Entered By: Baruch Gouty on 01/19/2022 09:08:55 [place], [name] ([id number]) [id number] Page 6 of  9 -------------------------------------------------------------------------------- Wound Assessment Details Patient Name: Date of Service: [name] 01/19/2022 8:15 A M Medical Record Number: [id number] Patient Account Number: [id number] Date of Birth/Sex: Treating RN: Sep 26, 1985 (36 y.o. [name] Primary Care Bailei Buist: [name] Other Clinician: Referring Damareon Lanni: Treating Yerick Eggebrecht/Extender: [name] in Treatment: 116 Wound Status Wound Number: 3 Primary Etiology: Diabetic Wound/Ulcer of the Lower Extremity Wound Location: Left, Lateral, Plantar Foot Wound Status: Open Wounding Event: Trauma Comorbid History: Type II Diabetes Date Acquired: 10/02/2019 Weeks Of Treatment: 116 Clustered Wound: No Photos Wound Measurements Length: (cm) 3 Width: (cm) 3.6 Depth: (cm) 0.4 Area: (cm) 8.482 Volume: (cm) 3.393 % Reduction in Area: -414.4% % Reduction in Volume: -1956.4% Epithelialization: Small (1-33%) Tunneling: No Undermining: Yes Starting Position (o'clock): 3 Ending Position (o'clock): 6 Maximum Distance: (cm) 2.7 Wound Description Classification: Grade 2 Wound Margin: Well defined, not attached Exudate Amount: Medium Exudate Type: Serosanguineous Exudate Color: red, brown Foul Odor After Cleansing: No Slough/Fibrino Yes Wound Bed Granulation Amount: Large (67-100%) Exposed Structure Granulation Quality: Red, Hyper-granulation Fascia Exposed: No Necrotic Amount: Small (1-33%) Fat Layer (Subcutaneous Tissue) Exposed: Yes Necrotic Quality: Adherent Slough Tendon Exposed: No Muscle Exposed: No Joint Exposed: No Bone Exposed: No Periwound Skin Texture Texture Color No Abnormalities Noted: No No Abnormalities Noted: Yes Callus: Yes Temperature / Pain Temperature: No Abnormality Moisture No Abnormalities Noted: Yes Treatment Notes [place], [name] ([id number]) [id number] Page 7 of  9 Wound #3 (Foot) Wound Laterality: Plantar, Left, Lateral Cleanser Soap and Water Discharge Instruction: May shower and wash wound with dial antibacterial soap and water prior to dressing change. Peri-Wound Care Zinc Oxide Ointment 30g tube Discharge Instruction: Apply Zinc Oxide to periwound with each dressing change Sween Lotion (Moisturizing lotion) Discharge Instruction: Apply moisturizing lotion as directed  Topical Primary Dressing Hydrofera Blue Ready Foam, 4x5 in Discharge Instruction: Apply to wound bed as instructed Secondary Dressing ABD Pad, 8x10 Discharge Instruction: Apply over primary dressing as directed. Optifoam Non-Adhesive Dressing, 4x4 in Discharge Instruction: Apply over primary dressing as directed. Woven Gauze Sponge, Non-Sterile 4x4 in Discharge Instruction: Apply over primary dressing as directed. Zetuvit Absorbent Pad, 4x4 (in/in) Secured With 30M Medipore H Soft Cloth Surgical T ape, 4 x 10 (in/yd) Discharge Instruction: Secure with tape as directed. Compression Wrap Kerlix Roll 4.5x3.1 (in/yd) Discharge Instruction: Apply Kerlix and Coban compression as directed. 30M ACE Elastic Bandage With VELCRO Brand Closure, 4 (in) Compression Stockings Add-Ons Electronic Signature(s) Signed: 01/19/2022 5:44:27 PM By: Baruch Gouty RN, BSN Signed: 01/21/2022 11:34:44 AM By: Worthy Rancher Entered By: Worthy Rancher on 01/19/2022 09:08:25 -------------------------------------------------------------------------------- Wound Assessment Details Patient Name: Date of Service: [name], [name] 01/19/2022 8:15 A M Medical Record Number: [id number] Patient Account Number: [id number] Date of Birth/Sex: Treating RN: Dec 21, 1985 (36 y.o. [name] Primary Care Collier Bohnet: [name] Other Clinician: Referring Kolston Lacount: Treating [place] Agostinelli/Extender: [name] in Treatment: 116 Wound Status Wound Number: 8 Primary Etiology: Diabetic  Wound/Ulcer of the Lower Extremity Wound Location: Right, Lateral Foot Wound Status: Open Wounding Event: Blister Comorbid History: Type II Diabetes Date Acquired: 04/18/2021 Weeks Of Treatment: 39 Clustered Wound: No Photos [place], [name] ([id number]) [id number] Page 8 of 9 Wound Measurements Length: (cm) 0.7 Width: (cm) 1 Depth: (cm) 0.1 Area: (cm) 0.55 Volume: (cm) 0.055 % Reduction in Area: 0% % Reduction in Volume: 50% Epithelialization: Small (1-33%) Wound Description Classification: Grade 2 Wound Margin: Well defined, not attached Exudate Amount: Medium Exudate Type: Serosanguineous Exudate Color: red, brown Foul Odor After Cleansing: No Slough/Fibrino Yes Wound Bed Granulation Amount: Large (67-100%) Exposed Structure Granulation Quality: Red, Pink Fascia Exposed: No Necrotic Amount: Small (1-33%) Fat Layer (Subcutaneous Tissue) Exposed: Yes Necrotic Quality: Adherent Slough Tendon Exposed: No Muscle Exposed: No Joint Exposed: No Bone Exposed: No Periwound Skin Texture Texture Color No Abnormalities Noted: No No Abnormalities Noted: Yes Callus: Yes Temperature / Pain Temperature: No Abnormality Moisture No Abnormalities Noted: Yes Treatment Notes Wound #8 (Foot) Wound Laterality: Right, Lateral Cleanser Soap and Water Discharge Instruction: May shower and wash wound with dial antibacterial soap and water prior to dressing change. Peri-Wound Care Sween Lotion (Moisturizing lotion) Discharge Instruction: Apply moisturizing lotion as directed Topical Primary Dressing Promogran Prisma Matrix, 4.34 (sq in) (silver collagen) Discharge Instruction: Moisten collagen with saline or hydrogel Secondary Dressing ABD Pad, 8x10 Discharge Instruction: Apply over primary dressing as directed. Optifoam Non-Adhesive Dressing, 4x4 in Discharge Instruction: Apply over primary dressing as directed. Woven Gauze Sponge, Non-Sterile 4x4  in Discharge Instruction: Apply over primary dressing as directed. Zetuvit Absorbent Pad, 4x4 (in/in) Secured With 30M Medipore H Soft Cloth Surgical T ape, 4 x 10 (in/yd) Discharge Instruction: Secure with tape as directed. [place], [name] ([id number]) [id number] Page 9 of 9 Compression Wrap Kerlix Roll 4.5x3.1 (in/yd) Discharge Instruction: Apply Kerlix and Coban compression as directed. 30M ACE Elastic Bandage With VELCRO Brand Closure, 4 (in) Compression Stockings Add-Ons Electronic Signature(s) Signed: 01/19/2022 5:44:27 PM By: Baruch Gouty RN, BSN Signed: 01/21/2022 11:34:44 AM By: Worthy Rancher Entered By: Worthy Rancher on 01/19/2022 09:07:43 -------------------------------------------------------------------------------- Vitals Details Patient Name: Date of Service: [name], [name] 01/19/2022 8:15 A M Medical Record Number: [id number] Patient Account Number: [id number] Date of Birth/Sex: Treating RN: Oct 01, 1985 (36 y.o. M) Primary Care Jolon Degante: [name] Other Clinician: Referring Akirra Lacerda: Treating  Umer Harig/Extender: [name] in Treatment: 116 Vital Signs Time Taken: 08:20 Temperature (F): 98.7 Height (in): 77 Pulse (bpm): 88 Weight (lbs): 280 Respiratory Rate (breaths/min): 20 Body Mass Index (BMI): 33.2 Blood Pressure (mmHg): 132/77 Capillary Blood Glucose (mg/dl): 113 Reference Range: 80 - 120 mg / dl Notes glucose per pt report this am Electronic Signature(s) Signed: 01/19/2022 5:44:27 PM By: Baruch Gouty RN, BSN Entered By: Baruch Gouty on 01/19/2022 08:57:51

## 2022-01-26 ENCOUNTER — Encounter (HOSPITAL_BASED_OUTPATIENT_CLINIC_OR_DEPARTMENT_OTHER): Payer: BC Managed Care – PPO | Admitting: General Surgery

## 2022-01-26 DIAGNOSIS — E11621 Type 2 diabetes mellitus with foot ulcer: Secondary | ICD-10-CM | POA: Diagnosis not present

## 2022-01-27 NOTE — Progress Notes (Signed)
[place], [name] ([id number]) [id number] Page 1 of 9 Visit Report for 01/26/2022 Arrival Information Details Patient Name: Date of Service: [name] 01/26/2022 8:15 A M Medical Record Number: [id number] Patient Account Number: [id number] Date of Birth/Sex: Treating RN: [birth year] (36 y.o. [name], [name] Primary Care Heli Dino: [name] Other Clinician: Referring Adamariz Gillott: Treating Avrian Delfavero/Extender: [name] in Treatment: 82 Visit Information History Since Last Visit Added or deleted any medications: No Patient Arrived: Ambulatory Any new allergies or adverse reactions: No Arrival Time: 08:48 Had a fall or experienced change in No Accompanied By: self activities of daily living that may affect Transfer Assistance: None risk of falls: Patient Identification Verified: Yes Signs or symptoms of abuse/neglect since last visito No Secondary Verification Process Completed: Yes Hospitalized since last visit: No Patient Requires Transmission-Based Precautions: No Implantable device outside of the clinic excluding No Patient Has Alerts: No cellular tissue based products placed in the center since last visit: Has Dressing in Place as Prescribed: Yes Has Compression in Place as Prescribed: Yes Pain Present Now: No Electronic Signature(s) Signed: 01/26/2022 4:57:50 PM By: Blanche East RN Entered By: Blanche East on 01/26/2022 09:49:54 -------------------------------------------------------------------------------- Encounter Discharge Information Details Patient Name: Date of Service: [name], [name] 01/26/2022 8:15 A M Medical Record Number: [id number] Patient Account Number: [id number] Date of Birth/Sex: Treating RN: 1985/07/03 (36 y.o. [name] Primary Care Naziah Portee: [name] Other Clinician: Referring Brandin Dilday: Treating Kobie Whidby/Extender: [name] in Treatment:  117 Encounter Discharge Information Items Post Procedure Vitals Discharge Condition: Stable Temperature (F): 98.3 Ambulatory Status: Ambulatory Pulse (bpm): 89 Discharge Destination: Home Respiratory Rate (breaths/min): 20 Transportation: Private Auto Blood Pressure (mmHg): 136/89 Accompanied By: self Schedule Follow-up Appointment: Yes Clinical Summary of Care: Electronic Signature(s) Signed: 01/26/2022 4:57:50 PM By: Blanche East RN Entered By: Blanche East on 01/26/2022 09:51:22 [place], [name] ([id number]) [id number] Page 2 of 9 -------------------------------------------------------------------------------- Lower Extremity Assessment Details Patient Name: Date of Service: [name] 01/26/2022 8:15 A M Medical Record Number: [id number] Patient Account Number: [id number] Date of Birth/Sex: Treating RN: [birth year] (36 y.o. [name] Primary Care Asaad Gulley: [name] Other Clinician: Referring Akeria Hedstrom: Treating Magalene Mclear/Extender: [name] in Treatment: 117 Edema Assessment Assessed: [name]: No] [Right: No] Edema: [Left: Yes] [Right: Yes] Calf Left: Right: Point of Measurement: 48 cm From Medial Instep 49.5 cm 47.5 cm Ankle Left: Right: Point of Measurement: 11 cm From Medial Instep 28.5 cm 28.5 cm Vascular Assessment Pulses: Dorsalis Pedis Palpable: [Left:Yes] [Right:Yes] Electronic Signature(s) Signed: 01/26/2022 6:06:53 PM By: Baruch Gouty RN, BSN Entered By: Baruch Gouty on 01/26/2022 08:50:35 -------------------------------------------------------------------------------- Multi Wound Chart Details Patient Name: Date of Service: [name], [name] 01/26/2022 8:15 A M Medical Record Number: [id number] Patient Account Number: [id number] Date of Birth/Sex: Treating RN: 04-07-1985 (36 y.o. M) Primary Care Nycholas Rayner: [name] Other Clinician: Referring Shatika Grinnell: Treating  Lajuanna Pompa/Extender: [name] in Treatment: 117 Vital Signs Height(in): 77 Pulse(bpm): 89 Weight(lbs): 280 Blood Pressure(mmHg): 136/89 Body Mass Index(BMI): 33.2 Temperature(F): 98.3 Respiratory Rate(breaths/min): 20 [3:Photos:] [N/A:N/A [id number] Page 3 of 9] Left, Lateral, Plantar Foot Right, Lateral Foot N/A Wound Location: Trauma Blister N/A Wounding Event: Diabetic Wound/Ulcer of the Lower Diabetic Wound/Ulcer of the Lower N/A Primary Etiology: Extremity Extremity Type II Diabetes Type II Diabetes N/A Comorbid History: 10/02/2019 04/18/2021 N/A Date Acquired: 117 40 N/A Weeks of Treatment: Open Open N/A Wound Status: No No N/A Wound Recurrence: 3.4x3.2x1.2 0.5x0.9x0.1 N/A  Measurements L x W x D (cm) 8.545 0.353 N/A A (cm) : rea 10.254 0.035 N/A Volume (cm) : -418.20% 35.80% N/A % Reduction in A rea: -6114.50% 68.20% N/A % Reduction in Volume: 3 Starting Position 1 (o'clock): 8 Ending Position 1 (o'clock): 1.2 Maximum Distance 1 (cm): Yes No N/A Undermining: Grade 2 Grade 2 N/A Classification: Medium Medium N/A Exudate A mount: Serosanguineous Serosanguineous N/A Exudate Type: red, brown red, brown N/A Exudate Color: Well defined, not attached Well defined, not attached N/A Wound Margin: Large (67-100%) Large (67-100%) N/A Granulation A mount: Red, Hyper-granulation Red, Pink N/A Granulation Quality: Small (1-33%) Small (1-33%) N/A Necrotic A mount: Fat Layer (Subcutaneous Tissue): Yes Fat Layer (Subcutaneous Tissue): Yes N/A Exposed Structures: Fascia: No Fascia: No Tendon: No Tendon: No Muscle: No Muscle: No Joint: No Joint: No Bone: No Bone: No Small (1-33%) Small (1-33%) N/A Epithelialization: Debridement - Selective/Open Wound Debridement - Selective/Open Wound N/A Debridement: Pre-procedure Verification/Time Out 09:10 09:10 N/A Taken: Lidocaine 4% T opical Solution  Lidocaine 4% T opical Solution N/A Pain Control: Callus, Slough Callus, Slough N/A Tissue Debrided: Skin/Epidermis Non-Viable Tissue N/A Level: 10.88 1 N/A Debridement A (sq cm): rea Curette Curette N/A Instrument: Minimum Minimum N/A Bleeding: Pressure Pressure N/A Hemostasis A chieved: 0 0 N/A Procedural Pain: 0 0 N/A Post Procedural Pain: Procedure was tolerated well Procedure was tolerated well N/A Debridement Treatment Response: 3.4x3.2x0.7 0.5x0.9x0.1 N/A Post Debridement Measurements L x W x D (cm) 5.982 0.035 N/A Post Debridement Volume: (cm) Callus: Yes Callus: Yes N/A Periwound Skin Texture: No Abnormalities Noted No Abnormalities Noted N/A Periwound Skin Moisture: No Abnormalities Noted No Abnormalities Noted N/A Periwound Skin Color: No Abnormality No Abnormality N/A Temperature: Debridement Debridement N/A Procedures Performed: Treatment Notes Electronic Signature(s) Signed: 01/26/2022 9:27:13 AM By: [name] MD FACS Entered By: [name] on 01/26/2022 09:27:12 -------------------------------------------------------------------------------- Multi-Disciplinary Care Plan Details Patient Name: Date of Service: [name], [name] 01/26/2022 8:15 A M Medical Record Number: [id number] Patient Account Number: [id number] Date of Birth/Sex: Treating RN: 1985-09-03 (35 y.o. [name] Primary Care Adonis Yim: [name] Other Clinician: Referring Jere Vanburen: Treating Eugean Arnott/Extender: [name] in Treatment: [place] reviewed with physician [place], [name] ([id number]) [id number] Page 4 of 9 Active Inactive Nutrition Nursing Diagnoses: Imbalanced nutrition Potential for alteratiion in Nutrition/Potential for imbalanced nutrition Goals: Patient/caregiver agrees to and verbalizes understanding of need to use nutritional supplements and/or vitamins as  prescribed Date Initiated: 10/24/2019 Date Inactivated: 04/06/2020 Target Resolution Date: 04/03/2020 Goal Status: Met Patient/caregiver will maintain therapeutic glucose control Date Initiated: 10/24/2019 Target Resolution Date: 02/16/2022 Goal Status: Active Interventions: Assess HgA1c results as ordered upon admission and as needed Assess patient nutrition upon admission and as needed per policy Provide education on elevated blood sugars and impact on wound healing Provide education on nutrition Treatment Activities: Education provided on Nutrition : 12/07/2021 Notes: 11/17/20: Glucose control ongoing issue, target date extended. 01/26/21: Glucose management continues. Wound/Skin Impairment Nursing Diagnoses: Impaired tissue integrity Knowledge deficit related to ulceration/compromised skin integrity Goals: Patient/caregiver will verbalize understanding of skin care regimen Date Initiated: 10/24/2019 Target Resolution Date: 02/16/2022 Goal Status: Active Ulcer/skin breakdown will have a volume reduction of 30% by week 4 Date Initiated: 10/24/2019 Date Inactivated: 01/16/2020 Target Resolution Date: 01/10/2020 Unmet Reason: no change in Goal Status: Unmet measurements. Interventions: Assess patient/caregiver ability to obtain necessary supplies Assess patient/caregiver ability to perform ulcer/skin care regimen upon admission and as needed Assess ulceration(s) every visit Provide education on ulcer  and skin care Notes: 11/17/20: Wound care regimen continues Electronic Signature(s) Signed: 01/26/2022 6:06:53 PM By: Baruch Gouty RN, BSN Entered By: Baruch Gouty on 01/26/2022 09:15:45 -------------------------------------------------------------------------------- Pain Assessment Details Patient Name: Date of Service: [name], [name] 01/26/2022 8:15 A M Medical Record Number: [id number] Patient Account Number: [id number] Date of Birth/Sex: Treating RN: [birth year] (37 y.o.  [name] Primary Care Aryelle Figg: [name] Other Clinician: Referring Peg Fifer: Treating Yosef Krogh/Extender: [name] in Treatment: [place], [name] ([id number]) [id number] Page 5 of 9 Location of Pain Severity and Description of Pain Patient Has Paino No Site Locations Rate the pain. Current Pain Level: 0 Pain Management and Medication Current Pain Management: Electronic Signature(s) Signed: 01/26/2022 6:06:53 PM By: Baruch Gouty RN, BSN Entered By: Baruch Gouty on 01/26/2022 08:48:40 -------------------------------------------------------------------------------- Patient/Caregiver Education Details Patient Name: Date of Service: [name], [name] 10/25/2023andnbsp8:15 A M Medical Record Number: [id number] Patient Account Number: [id number] Date of Birth/Gender: Treating RN: Jan 06, 1986 (36 y.o. [name] Primary Care Physician: [name] Other Clinician: Referring Physician: Treating Physician/Extender: [name] in Treatment: 117 Education Assessment Education Provided To: Patient Education Topics Provided Elevated Blood Sugar/ Impact on Healing: Methods: Explain/Verbal Responses: Reinforcements needed, State content correctly Offloading: Methods: Explain/Verbal Responses: Reinforcements needed, State content correctly Wound/Skin Impairment: Methods: Explain/Verbal Responses: Reinforcements needed, State content correctly Electronic Signature(s) Signed: 01/26/2022 6:06:53 PM By: Baruch Gouty RN, BSN Entered By: Baruch Gouty on 01/26/2022 09:16:40 [place], [name] ([id number]) [id number] Page 6 of 9 -------------------------------------------------------------------------------- Wound Assessment Details Patient Name: Date of Service: [name] 01/26/2022 8:15 A M Medical Record Number:  [id number] Patient Account Number: [id number] Date of Birth/Sex: Treating RN: 03/12/1986 (36 y.o. M) Primary Care Crixus Mcaulay: [name] Other Clinician: Referring Jary Louvier: Treating Eeva Schlosser/Extender: [name] in Treatment: 117 Wound Status Wound Number: 3 Primary Etiology: Diabetic Wound/Ulcer of the Lower Extremity Wound Location: Left, Lateral, Plantar Foot Wound Status: Open Wounding Event: Trauma Comorbid History: Type II Diabetes Date Acquired: 10/02/2019 Weeks Of Treatment: 117 Clustered Wound: No Photos Wound Measurements Length: (cm) 3.4 Width: (cm) 3.2 Depth: (cm) 1.2 Area: (cm) 8.545 Volume: (cm) 10.254 % Reduction in Area: -418.2% % Reduction in Volume: -6114.5% Epithelialization: Small (1-33%) Tunneling: No Undermining: Yes Starting Position (o'clock): 3 Ending Position (o'clock): 8 Maximum Distance: (cm) 1.2 Wound Description Classification: Grade 2 Wound Margin: Well defined, not attached Exudate Amount: Medium Exudate Type: Serosanguineous Exudate Color: red, brown Foul Odor After Cleansing: No Slough/Fibrino Yes Wound Bed Granulation Amount: Large (67-100%) Exposed Structure Granulation Quality: Red, Hyper-granulation Fascia Exposed: No Necrotic Amount: Small (1-33%) Fat Layer (Subcutaneous Tissue) Exposed: Yes Necrotic Quality: Adherent Slough Tendon Exposed: No Muscle Exposed: No Joint Exposed: No Bone Exposed: No Periwound Skin Texture Texture Color No Abnormalities Noted: No No Abnormalities Noted: Yes Callus: Yes Temperature / Pain Temperature: No Abnormality Moisture No Abnormalities Noted: Yes Treatment Notes [place], [name] ([id number]) [id number] Page 7 of 9 Wound #3 (Foot) Wound Laterality: Plantar, Left, Lateral Cleanser Soap and Water Discharge Instruction: May shower and wash wound with dial antibacterial soap and water prior to dressing change. Peri-Wound  Care Zinc Oxide Ointment 30g tube Discharge Instruction: Apply Zinc Oxide to periwound with each dressing change Sween Lotion (Moisturizing lotion) Discharge Instruction: Apply moisturizing lotion as directed Topical Primary Dressing Hydrofera Blue Ready Foam, 4x5 in Discharge Instruction: Apply to wound bed as instructed Secondary Dressing ABD Pad, 8x10 Discharge Instruction: Apply over primary dressing as directed. Optifoam  Non-Adhesive Dressing, 4x4 in Discharge Instruction: Apply over primary dressing as directed. Woven Gauze Sponge, Non-Sterile 4x4 in Discharge Instruction: Apply over primary dressing as directed. Zetuvit Absorbent Pad, 4x4 (in/in) Secured With 58M Medipore H Soft Cloth Surgical T ape, 4 x 10 (in/yd) Discharge Instruction: Secure with tape as directed. Compression Wrap Kerlix Roll 4.5x3.1 (in/yd) Discharge Instruction: Apply Kerlix and Coban compression as directed. 58M ACE Elastic Bandage With VELCRO Brand Closure, 4 (in) Compression Stockings Add-Ons Electronic Signature(s) Signed: 01/26/2022 6:06:53 PM By: Baruch Gouty RN, BSN Entered By: Baruch Gouty on 01/26/2022 08:53:39 -------------------------------------------------------------------------------- Wound Assessment Details Patient Name: Date of Service: [name], [name] 01/26/2022 8:15 A M Medical Record Number: [id number] Patient Account Number: [id number] Date of Birth/Sex: Treating RN: [birth year] (36 y.o. [name] Primary Care Branon Sabine: [name] Other Clinician: Referring Davan Nawabi: Treating Reilley Valentine/Extender: [name] in Treatment: 117 Wound Status Wound Number: 8 Primary Etiology: Diabetic Wound/Ulcer of the Lower Extremity Wound Location: Right, Lateral Foot Wound Status: Open Wounding Event: Blister Comorbid History: Type II Diabetes Date Acquired: 04/18/2021 Weeks Of Treatment: 40 Clustered Wound: No Photos [place], [name]  ([id number]) [id number] Page 8 of 9 Wound Measurements Length: (cm) 0.5 Width: (cm) 0.9 Depth: (cm) 0.1 Area: (cm) 0.353 Volume: (cm) 0.035 % Reduction in Area: 35.8% % Reduction in Volume: 68.2% Epithelialization: Small (1-33%) Tunneling: No Undermining: No Wound Description Classification: Grade 2 Wound Margin: Well defined, not attached Exudate Amount: Medium Exudate Type: Serosanguineous Exudate Color: red, brown Foul Odor After Cleansing: No Slough/Fibrino Yes Wound Bed Granulation Amount: Large (67-100%) Exposed Structure Granulation Quality: Red, Pink Fascia Exposed: No Necrotic Amount: Small (1-33%) Fat Layer (Subcutaneous Tissue) Exposed: Yes Necrotic Quality: Adherent Slough Tendon Exposed: No Muscle Exposed: No Joint Exposed: No Bone Exposed: No Periwound Skin Texture Texture Color No Abnormalities Noted: No No Abnormalities Noted: Yes Callus: Yes Temperature / Pain Temperature: No Abnormality Moisture No Abnormalities Noted: Yes Treatment Notes Wound #8 (Foot) Wound Laterality: Right, Lateral Cleanser Soap and Water Discharge Instruction: May shower and wash wound with dial antibacterial soap and water prior to dressing change. Peri-Wound Care Sween Lotion (Moisturizing lotion) Discharge Instruction: Apply moisturizing lotion as directed Topical Primary Dressing Promogran Prisma Matrix, 4.34 (sq in) (silver collagen) Discharge Instruction: Moisten collagen with saline or hydrogel Secondary Dressing ABD Pad, 8x10 Discharge Instruction: Apply over primary dressing as directed. Optifoam Non-Adhesive Dressing, 4x4 in Discharge Instruction: Apply over primary dressing as directed. Woven Gauze Sponge, Non-Sterile 4x4 in Discharge Instruction: Apply over primary dressing as directed. Zetuvit Absorbent Pad, 4x4 (in/in) Secured With 58M Medipore H Soft Cloth Surgical T ape, 4 x 10 (in/yd) Discharge Instruction: Secure with  tape as directed. [place], [name] ([id number]) [id number] Page 9 of 9 Compression Wrap Kerlix Roll 4.5x3.1 (in/yd) Discharge Instruction: Apply Kerlix and Coban compression as directed. 58M ACE Elastic Bandage With VELCRO Brand Closure, 4 (in) Compression Stockings Add-Ons Electronic Signature(s) Signed: 01/26/2022 6:06:53 PM By: Baruch Gouty RN, BSN Entered By: Baruch Gouty on 01/26/2022 08:51:24 -------------------------------------------------------------------------------- Vitals Details Patient Name: Date of Service: [name], [name] 01/26/2022 8:15 A M Medical Record Number: [id number] Patient Account Number: [id number] Date of Birth/Sex: Treating RN: 1985-10-29 (36 y.o. M) Primary Care Andruw Battie: [name] Other Clinician: Referring Javarius Tsosie: Treating Neville Walston/Extender: [name] in Treatment: 117 Vital Signs Time Taken: 08:35 Temperature (F): 98.3 Height (in): 77 Pulse (bpm): 89 Weight (lbs): 280 Respiratory Rate (breaths/min): 20 Body Mass Index (BMI): 33.2 Blood Pressure (mmHg): 136/89 Reference Range:  80 - 120 mg / dl Electronic Signature(s) Signed: 01/26/2022 9:20:20 AM By: Worthy Rancher Entered By: Worthy Rancher on 01/26/2022 08:50:33

## 2022-01-27 NOTE — Progress Notes (Signed)
[name], Max ([id number]) [id number] Page 1 of 22 Visit Report for 01/26/2022 Chief Complaint Document Details Patient Name: Date of Service: [name] 01/26/2022 8:15 A M Medical Record Number: [id number] Patient Account Number: [id number] Date of Birth/Sex: Treating RN: 04/23/1985 (36 y.o. M) Primary Care Provider: [name] Other Clinician: Referring Provider: Treating Provider/Extender: [name] in Treatment: [place] from: Patient Chief Complaint 01/11/2019; patient is here for review of a rather substantial wound over the left fifth plantar metatarsal head extending into the lateral part of his foot 10/24/2019; patient returns to clinic with wounds on his bilateral feet with underlying osteomyelitis biopsy-proven Electronic Signature(s) Signed: 01/26/2022 9:27:21 AM By: [name] MD FACS Entered By: [name] on 01/26/2022 09:27:21 -------------------------------------------------------------------------------- Debridement Details Patient Name: Date of Service: [name], [name] 01/26/2022 8:15 A M Medical Record Number: [id number] Patient Account Number: [id number] Date of Birth/Sex: Treating RN: 07-Nov-1985 (36 y.o. [name] Primary Care Provider: [name] Other Clinician: Referring Provider: Treating Provider/Extender: [name] in Treatment: 117 Debridement Performed for Assessment: Wound #3 Left,Lateral,Plantar Foot Performed By: Physician [name], MD Debridement Type: Debridement Severity of Tissue Pre Debridement: Fat layer exposed Level of Consciousness (Pre-procedure): Awake and Alert Pre-procedure Verification/Time Out Yes - 09:10 Taken: Start Time: 09:11 Pain Control: Lidocaine 4% T opical Solution T Area Debrided (L x W): otal 3.4 (cm) x 3.2 (cm) = 10.88 (cm) Tissue and other material debrided: Non-Viable,  Callus, Slough, Skin: Epidermis, Slough Level: Skin/Epidermis Debridement Description: Selective/Open Wound Instrument: Curette Bleeding: Minimum Hemostasis Achieved: Pressure Procedural Pain: 0 Post Procedural Pain: 0 Response to Treatment: Procedure was tolerated well Level of Consciousness (Post- Awake and Alert procedure): Post Debridement Measurements of Total Wound Length: (cm) 3.4 Width: (cm) 3.2 Depth: (cm) 0.7 Volume: (cm) 5.982 Character of Wound/Ulcer Post Debridement: Improved Severity of Tissue Post Debridement: Fat layer exposed [name], Max ([id number]) [id number] Page 2 of 22 Post Procedure Diagnosis Same as Pre-procedure Notes scribed by Baruch Gouty, RN for Dr. [name] Electronic Signature(s) Signed: 01/26/2022 12:04:38 PM By: [name] MD FACS Signed: 01/26/2022 6:06:53 PM By: Baruch Gouty RN, BSN Entered By: Baruch Gouty on 01/26/2022 09:20:23 -------------------------------------------------------------------------------- Debridement Details Patient Name: Date of Service: [name], [name] 01/26/2022 8:15 A M Medical Record Number: [id number] Patient Account Number: [id number] Date of Birth/Sex: Treating RN: 03-17-1986 (36 y.o. [name] Primary Care Provider: [name] Other Clinician: Referring Provider: Treating Provider/Extender: [name] in Treatment: 117 Debridement Performed for Assessment: Wound #8 Right,Lateral Foot Performed By: Physician [name], MD Debridement Type: Debridement Severity of Tissue Pre Debridement: Fat layer exposed Level of Consciousness (Pre-procedure): Awake and Alert Pre-procedure Verification/Time Out Yes - 09:10 Taken: Start Time: 09:11 Pain Control: Lidocaine 4% Topical Solution T Area Debrided (L x W): otal 1 (cm) x 1 (cm) = 1 (cm) Tissue and other material debrided: Non-Viable, Callus, Slough, Slough Level:  Non-Viable Tissue Debridement Description: Selective/Open Wound Instrument: Curette Bleeding: Minimum Hemostasis Achieved: Pressure Procedural Pain: 0 Post Procedural Pain: 0 Response to Treatment: Procedure was tolerated well Level of Consciousness (Post- Awake and Alert procedure): Post Debridement Measurements of Total Wound Length: (cm) 0.5 Width: (cm) 0.9 Depth: (cm) 0.1 Volume: (cm) 0.035 Character of Wound/Ulcer Post Debridement: Improved Severity of Tissue Post Debridement: Fat layer exposed Post Procedure Diagnosis Same as Pre-procedure Notes scribed by Baruch Gouty, RN for Dr. [name] Electronic Signature(s) Signed: 01/26/2022 12:04:38 PM By: [name] MD  FACS Signed: 01/26/2022 6:06:53 PM By: Baruch Gouty RN, BSN Entered By: Baruch Gouty on 01/26/2022 09:20:38 [place], Max ([id number]) [id number] Page 3 of 22 -------------------------------------------------------------------------------- HPI Details Patient Name: Date of Service: [name] 01/26/2022 8:15 A M Medical Record Number: [id number] Patient Account Number: [id number] Date of Birth/Sex: Treating RN: 1986/01/31 (36 y.o. 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 He comes in for review of a wound over the plantar fifth metatarsal head extending into the lateral part of the foot. He was followed for this previously by his podiatrist Dr. Cornelius Moras. As the patient tells his story he went to see podiatry first for a swelling he developed on the lateral part of his fifth metatarsal head in May. He states this was "open" by podiatry and the area closed. He was followed up in June and it was again  opened callus removed and it closed promptly. There were plans being made for surgery on the fifth metatarsal head in June however his blood sugar was apparently too high for anesthesia. Apparently the area was debrided and opened again in June and it is never closed since. Looking over the records from podiatry I am really not able to follow this. It was clear when he was first seen it was before 5/14 at that point he already had a wound. By 5/17 the ulcer was resolved. I do not see anything about a procedure. On 5/28 noted to have pre-ulcerative moderate keratosis. X-ray noted 1/5 contracted toe and tailor's bunion and metatarsal deformity. On a visit date on 09/28/2018 the dorsal part of the left foot it healed and resolved. There was concern about swelling in his lower extremity he was sent to the ER.. As far as I can tell he was seen in the ER on 7/12 with an ulcer on his left foot. A DVT rule out of the left leg was negative. I do not think I have complete records from podiatry but I am not able to verify the procedures this patient states he had. He states after the last procedure the wound has never closed although I am not able to follow this in the records I have from podiatry. He has not had a recent x-ray The patient has been using Neosporin on the wound. He is wearing a Darco shoe. He is still very active up on his foot working and exercising. Past medical history; type 2 diabetes ketosis-prone, leg swelling with a negative DVT study in July. Non-smoker ABI in our clinic was 0.85 on the left 10/16; substantial wound on the plantar left fifth met head extending laterally almost to the dorsal fifth MTP. We have been using silver alginate we gave him a Darco forefoot off loader. An x-ray did not show evidence of osteomyelitis did note soft tissue emphysema which I think was due to gas tracking through an open wound. There is no doubt in my mind he requires an MRI 10/23; MRI not booked until 3  November at the earliest this is largely due to his glucose sensor in the right arm. We have been using silver alginate. There has been an improvement 10/29; I am still not exactly sure when his MRI is booked for. He says it is the third but it is the 10th in epic. This  definitely needs to be done. He is running a low-grade fever today but no other symptoms. No real improvement in the 1 02/26/2019 patient presents today for a follow-up visit here in our clinic he is last been seen in the clinic on October 29. Subsequently we were working on getting MRI to evaluate and see what exactly was going on and where we would need to go from the standpoint of whether or not he had osteomyelitis and again what treatments were going be required. Subsequently the patient ended up being admitted to the hospital on 02/07/2019 and was discharged on 02/14/2019. This is a somewhat interesting admission with a discharge diagnosis of pneumonia due to COVID-19 although he was positive for COVID-19 when tested at the urgent care but negative x2 when he was actually in the hospital. With that being said he did have acute respiratory failure with hypoxia and it was noted he also have a left foot ulceration with osteomyelitis. With that being said he did require oxygen for his pneumonia and I level 4 L. He was placed on antivirals and steroids for the COVID-19. He was also transferred to the [facility] at one point. Nonetheless he did subsequently discharged home and since being home has done much better in that regard. The CT angiogram did not show any pulmonary embolism. With regard to the osteomyelitis the patient was placed on vancomycin and Zosyn while in the hospital but has been changed to Augmentin at discharge. It was also recommended that he follow- up with wound care and podiatry. Podiatry however wanted him to see [name] according to the patient prior to them doing anything further. His hemoglobin A1c was 9.9  as noted in the hospital. Have an MRI of the left foot performed while in the hospital on 02/04/2019. 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 There is an overlying plantar open wound noted an abscess tracking back along the lateral aspect of the fifth metatarsal shaft. There is otherwise diffuse cellulitis and mild fasciitis without findings of polymyositis. The patient did have recently pneumonia secondary to COVID-19 I looked in the chart through epic and it does appear that the patient may need to have an additional x-ray just to ensure everything is cleared and that he has no airspace disease prior to putting him into the [name]. 03/05/2019; patient was readmitted to the clinic last week. He was hospitalized twice for a viral upper respiratory tract infection from 11/1 through 11/4 and then 11/5 through 11/12 ultimately this turned out to be Covid pneumonitis. Although he was discharged on oxygen he is not using it. He says he feels fine. He has no exercise limitation no cough no sputum. His O2 sat in our clinic today was 100% on room air. 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 He received Vanco and Zosyn in the hospital and then was discharged on 2 weeks of Augmentin. I do not see any relevant cultures. He was supposed to follow-up with infectious disease but I do not see that he has an appointment. 12/8; patient saw Dr. Novella Olive of infectious disease last week. He felt that he had had adequate antibiotic therapy. He did not go to follow-up with Dr. Amalia Hailey of podiatry and I have again talked to him about the pros and cons of this. He  does not want to consider a ray amputation of this time. He is aware of the risks of recurrence, migration etc. He started HBO today and tolerated this  well. He can complete the Augmentin that I gave him last week. I have looked over the lab work that Dr. Chana Bode ordered his C-reactive protein was 3.3 and his sedimentation rate was 17. The C-reactive protein is never really been measurably that high in this patient 12/15; not much change in the wound today however he has undermining along the lateral part of the foot again more extensively than last week. He has some rims of epithelialization. We have been using silver alginate. He is undergoing hyperbarics but did not dive today 12/18; in for his obligatory first total contact cast change. Unfortunately there was pus coming from the undermining area around his fifth metatarsal head. This was cultured but will preclude reapplication of a cast. He is seen in conjunction with HBO 12/24; patient had staph lugdunensis in the wound in the undermining area laterally last time. We put him on doxycycline which should have covered this. The wound looks better today. I am going to give him another week of doxycycline before reattempting the total contact cast 12/31; the patient is completing antibiotics. Hemorrhagic debris in the distal part of the wound with some undermining distally. He also had hyper granulation. Extensive debridement with a #5 curette. The infected area that was on the lateral part of the fifth met head is closed over. I do not think he needs any more antibiotics. Patient was seen prior to HBO. Preparations for a total contact cast were made in the cast will be placed post hyperbarics 04/11/19; once again the patient arrives today without complaint. He had been in a cast all week noted that he had heavy drainage this week. This resulted in large raised areas of macerated tissue around the wound 1/14; wound bed looks better slightly smaller. Hydrofera Blue has been changing himself. He had a heavy drainage last week which caused a lot of maceration [name], Max ([id number])  [phone].pdf Page 4 of 22 around the wound so I took him out of a total contact cast he says the drainage is actually better this week He is seen today in conjunction with HBO 1/21; returns to clinic. He was up in [place] for a day or 2 attending a funeral. He comes back in with the wound larger and with a large area of exposed bone. He had osteomyelitis and septic arthritis of the fifth left metatarsal head while he was in hospital. He received IV antibiotics in the hospital for a prolonged period of time then 3 weeks of Augmentin. Subsequently I gave him 2 weeks of doxycycline for more superficial wound infection. When I saw this last week the wound was smaller the surface of the wound looks satisfactory. 1/28; patient missed hyperbarics today. Bone biopsy I did last time showed Enterococcus faecalis and Staphylococcus lugdunensis . He has a wide area of exposed bone. We are going to use silver alginate as of today. I had another ethical discussion with the patient. 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 In this situation I think the likelihood of healing this is low. Therefore I have recommended a ray amputation and with the patient's agreement I have referred him to Dr. Doran Durand. The other issue is that his compliance with hyperbarics has been minimal because of his work schedule and given his underlying decision I am going to stop  this today READMISSION 10/24/2019 MRI 09/29/2019 left foot IMPRESSION: 1. Apparent skin ulceration inferior and lateral to the 5th metatarsal base with underlying heterogeneous T2 signal and enhancement in the subcutaneous fat. Small peripherally enhancing fluid collections along the plantar and lateral aspects of the 5th metatarsal base suspicious for abscesses. 2. Interval amputation through the mid 5th metatarsal with nonspecific low-level marrow edema and enhancement. Given the proximity to the  adjacent soft tissue inflammatory changes, osteomyelitis cannot be excluded. 3. The additional bones appear unremarkable. MRI 09/29/2019 right foot IMPRESSION: 1. Soft tissue ulceration lateral to the 5th MTP joint. There is low-level T2 hyperintensity within the 4th and 5th metatarsal heads and adjacent proximal phalanges without abnormal T1 signal or cortical destruction. These findings are nonspecific and could be seen with early marrow edema, hyperemia or early osteomyelitis. No evidence of septic joint. 2. Mild tenosynovitis and synovial enhancement associated with the extensor digitorum tendons at the level of the midfoot. 3. Diffuse low-level muscular T2 hyperintensity and enhancement, most consistent with diabetic myopathy. LEFT FOOT BONE Methicillin resistant staphylococcus aureus Staphylococcus lugdunensis MIC MIC CIPROFLOXACIN >=8 RESISTANT Resistant <=0.5 SENSI... Sensitive CLINDAMYCIN <=0.25 SENS... Sensitive >=8 RESISTANT Resistant ERYTHROMYCIN >=8 RESISTANT Resistant >=8 RESISTANT Resistant GENTAMICIN <=0.5 SENSI... Sensitive <=0.5 SENSI... Sensitive Inducible Clindamycin NEGATIVE Sensitive NEGATIVE Sensitive OXACILLIN >=4 RESISTANT Resistant 2 SENSITIVE Sensitive RIFAMPIN <=0.5 SENSI... Sensitive <=0.5 SENSI... Sensitive TETRACYCLINE <=1 SENSITIVE Sensitive <=1 SENSITIVE Sensitive TRIMETH/SULFA <=10 SENSIT Sensitive <=10 SENSIT Sensitive ... Max Kitchen.. VANCOMYCIN 1 SENSITIVE Sensitive <=0.5 SENSI... Sensitive Right foot bone . Component 3 wk ago Specimen Description BONE Special Requests RIGHT 4 METATARSAL SAMPLE B Gram Stain NO WBC SEEN NO ORGANISMS SEEN Culture RARE METHICILLIN RESISTANT STAPHYLOCOCCUS AUREUS NO ANAEROBES ISOLATED Performed at [facility], [place] [address], [place], [place] [address] Report Status 10/08/2019 FINAL Organism ID, Bacteria METHICILLIN RESISTANT STAPHYLOCOCCUS AUREUS Resulting Agency [facility] Susceptibility Methicillin  resistant staphylococcus aureus MIC CIPROFLOXACIN >=8 RESISTANT Resistant CLINDAMYCIN <=0.25 SENS... Sensitive ERYTHROMYCIN >=8 RESISTANT Resistant GENTAMICIN <=0.5 SENSI... Sensitive Inducible Clindamycin NEGATIVE Sensitive OXACILLIN >=4 RESISTANT Resistant RIFAMPIN <=0.5 SENSI... Sensitive TETRACYCLINE <=1 SENSITIVE Sensitive TRIMETH/SULFA <=10 SENSIT Sensitive ... VANCOMYCIN 1 SENSITIVE Sensitive This is a patient we had in clinic earlier this year with a wound over his left fifth metatarsal head. 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 Doran Durand on 05/16/2019. He seems to have developed problems with wounds on his bilateral feet in June 2021 although he may have had problems earlier than this. He was in an urgent care with a right foot ulcer on 09/26/2019 and given a course of doxycycline. This was apparently after having trouble getting into see orthopedics. He was seen by podiatry on 09/28/2019 noted to have bilateral lower extremity ulcers including the left lateral fifth metatarsal base and the right subfifth met head. It was noted that had purulent drainage at that time. He required hospitalization from 6/20 through 7/2. This was because of worsening right foot wounds. He underwent bilateral operative incision and drainage and bone biopsies bilaterally. Culture results are listed above. He has been referred back to clinic by Dr. Jacqualyn Posey of podiatry. He is also followed by Dr. Megan Salon who saw him yesterday. He was discharged  from hospital on Zyvox Flagyl and Levaquin and yesterday changed to doxycycline Flagyl and Levaquin. His  inflammatory markers on 6/26 showed a sedimentation rate of 129 and a C-reactive protein of 5. This is improved to 14 and 1.3 respectively. This would indicate improvement. ABIs in our clinic today were 1.23 on the right and 1.20 on the left 11/01/2019 on evaluation today patient appears to be doing fairly well in regard to the wounds on his feet at this point. Fortunately there is no signs of active infection at this time. No fevers, chills, nausea, vomiting, or diarrhea. He currently is seeing infectious disease and still under their care at this point. Subsequently he also has both wounds which she has not been using collagen on as he did not receive that in his packaging he did not call us and let us know that. Apparently that just was missed on the order. Nonetheless we will get that straightened out today. 8/9-Patient returns for bilateral foot wounds, using Prisma with hydrogel moistened dressings, and the wounds appear stable. Patient using surgical shoes, avoiding much pressure or weightbearing as much as possible 8/16; patient has bilateral foot wounds. 1 on the right lateral foot proximally the other is on the left mid lateral foot. Both required debridement of callus and thick skin around the wounds. We have been using silver collagen 8/27; patient has bilateral lateral foot wounds. The area on the left substantially surrounded by callus and dry skin. This was removed from the wound edge. The underlying wound is small. The area on the right measured somewhat smaller today. 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 Unfortunately I did not update his antibiotics during today's visit. 9/10 I reviewed Dr. Hale Bogus last notes he felt he had completed antibiotics his inflammatory markers were reasonably well controlled. He has a small wound on the lateral left foot and a tiny area on the right which is just above closed. He is  using Hydrofera Blue with border foam he has bilateral surgical shoes 9/24; 2 week f/u. doing well. right foot is closed. left foot still undermined. 10/14; right foot remains closed at the fifth met head. The area over the base of the left fifth metatarsal has a small open area but considerable undermining towards the plantar foot. Thick callus skin around this suggests an adequate pressure relief. We have talked about this. He says he is going to go back into his cam boot. I suggested a total contact cast he did not seem enamored with this suggestion 10/26; left foot base of the fifth metatarsal. Same condition as last time. He has skin over the area with an open wound however the skin is not adherent. He went to see Dr. Earleen Newport who did an x-ray and culture of his foot I have not reviewed the x-ray but the patient was not told anything. He is on doxycycline 11/11; since the patient was last here he was in the emergency room on 10/30 he was concerned about swelling in the left foot. They did not do any cultures or x-rays. They changed his antibiotics to cephalexin. Previous culture showed group B strep. The cephalexin is appropriate as doxycycline has less than predictable coverage. Arrives in clinic today with swelling over this area under the wound. He also has a new wound on the right fifth metatarsal head 11/18; the patient has a difficult wound on the lateral aspect of the left fifth metatarsal head. The  wound was almost ballotable last week I opened it slightly expecting to see purulence however there was just bleeding. I cultured this this was negative. X-ray unchanged. We are trying to get an MRI but I am not sure were going to be able to get this through his insurance. He also has an area on the right lateral fifth metatarsal head this looks healthier 12/3; the patient finally got our MRI. Surprisingly this did not show osteomyelitis. I did show the soft tissue ulceration at the lateral plantar  aspect of the fifth metatarsal base with a tiny residual 6 mm abscess overlying the superficial fascia I have tried to culture this area I have not been able to get this to grow anything. Nevertheless the protruding tissue looks aggravated. I suspect we should try to treat the underlying "abscess with broad-spectrum antibiotics. I am going to start him on Levaquin and Flagyl. He has much less edema in his legs and I am going to continue to wrap his legs and see him weekly 12/10. I started Levaquin and Flagyl on him last week. He just picked up the Flagyl apparently there was some delay. The worry is the wound on the left fifth metatarsal base which is substantial and worsening. His foot looks like he inverts at the ankle making this a weightbearing surface. Certainly no improvement in fact I think the measurements of this are somewhat worse. We have been using 12/17; he apparently just got the Levaquin yesterday this is 2 weeks after the fact. He has completed the Flagyl. The area over the left fifth metatarsal base still has protruding granulation tissue although it does not look quite as bad as it did some weeks ago. He has severe bilateral lymphedema although we have not been treating him for wounds on his legs this is definitely going to require compression. There was so much edema in the left I did not wish to put him in a total contact cast today. I am going to increase his compression from 3-4 layer. The area on the right lateral fifth met head actually look quite good and superficial. 12/23; patient arrived with callus on the right fifth met head and the substantial hyper granulated callused wound on the base of his fifth metatarsal. He says he is completing his Levaquin in 2 days but I do not think that adds up with what I gave him but I will have to double check this. We are using Hydrofera Blue on both areas. My plan is to put the left leg in a cast the week after New Year's 04/06/2020;  patient's wounds about the same. Right lateral fifth metatarsal head and left lateral foot over the base of the fifth metatarsal. There is undermining on the left lateral foot which I removed before application of total contact cast continuing with Hydrofera Blue new. Patient tells me he was seen by endocrinology today lab work was done [Dr. Kerr]. Also wondering whether he was referred to cardiology. I went over some lab work from previously does not have chronic renal failure certainly not nephrotic range proteinuria he does have very poorly controlled diabetes but this is not his most updated lab work. Hemoglobin A1c has been over 11 1/10; the patient had a considerable amount of leakage towards mid part of his left foot with macerated skin however the wound surface looks better the area on the right lateral fifth met head is better as well. I am going to change the dressing on the left foot  under the total contact cast to silver alginate, continue with Hydrofera Blue on the right. 1/20; patient was in the total contact cast for 10 days. Considerable amount of drainage although the skin around the wound does not look too bad on the left foot. The area on the right fifth metatarsal head is closed. Our nursing staff reports large amount of drainage out of the left lateral foot wound 1/25; continues with copious amounts of drainage described by our intake staff. PCR culture I did last week showed E. coli and Enterococcus faecalis and low quantities. Multiple resistance genes documented including extended spectrum beta lactamase, MRSA, MRSE, quinolone, tetracycline. The wound is not quite as good this week as it was 5 days ago but about the same size 2/3; continues with copious amounts of malodorous drainage per our intake nurse. The PCR culture I did 2 weeks ago showed E. coli and low quantities of Enterococcus. There were multiple resistance genes detected. I put Neosporin on him last week although  this does not seem to have helped. The wound is slightly deeper today. Offloading continues to be an issue here although with the amount of drainage she has a total contact cast is just not going to work 2/10; moderate amount of drainage. Patient reports he cannot get his stocking on over the dressing. I told him we have to do that the nurse gave him suggestions on how to make this work. The wound is on the bottom and lateral part of his left foot. Is cultured predominantly grew low amounts of Enterococcus, E. coli and anaerobes. There were multiple resistance genes detected including extended spectrum beta lactamase, quinolone, tetracycline. I could not think of an easy oral combination to address this so for now I am going to do topical antibiotics provided by [facility] I think the main agents here are vancomycin and an aminoglycoside. We have to be able to give him access to the wounds to get the topical antibiotic on 2/17; moderate amount of drainage this is unchanged. He has his Keystone topical antibiotic against the deep tissue culture organisms. He has been using this and changing the dressing daily. Silver alginate on the wound surface. 2/24; using Keystone antibiotic with silver alginate on the top. He had too much drainage for a total contact cast at one point although I think that is improving and I think in the next week or 2 it might be possible to replace a total contact cast I did not do this today. In general the wound surface looks healthy however he continues to have thick rims of skin and subcutaneous tissue around the wide area of the circumference which I debrided 06/04/2020 upon evaluation today patient appears to be doing well in regard to his wound. I do feel like he is showing signs of improvement. There is little bit of callus and dead tissue around the edges of the wound as well as what appears to be a little bit of a sinus tract that is off to the side laterally I would  perform debridement to clear that away today. 3/17; left lateral foot. The wound looks about the same as I remember. Not much depth surface looks healthy. No evidence of infection 3/25; left lateral foot. Wound surface looks about the same. Separating epithelium from the circumference. There really is no evidence of infection here however not making progress by my view [place], Max ([id number]) [id number] Page 6 of 22 3/29; left lateral foot. Surface of the wound again looks reasonably  healthy still thick skin and subcutaneous tissue around the wound margins. There is no evidence of infection. One of the concerns being brought up by the nurses has again the amount of drainage vis--vis continued use of a total contact cast 4/5; left lateral foot at roughly the base of the fifth metatarsal. Nice healthy looking granulated tissue with rims of epithelialization. The overall wound measurements are not any better but the tissue looks healthy. The only concern is the amount of drainage although he has no surrounding maceration with what we have been doing recently to absorb fluid and protect his skin. He also has lymphedema. 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 He states that the kerlix/coban under the TCC rolled down and could not get it back up. He has been using an offloading scooter and has somehow hurt his right foot using this device. This happened last week. He states that the side of his right foot developed a blister and opened. The top of his foot also has a few small open wounds he thinks is due to his socks rubbing in his shoes. He has not been using any dressings to the wound. He denies purulent drainage,  fever/chills or erythema to the wounds. 4/22; patient presents for 1 week follow-up. He developed new wounds to the right foot that were evaluated at last clinic visit. He continues to have a total contact cast to the left leg and he reports no issues. He has been using silver collagen to the right foot wounds with no issues. He denies purulent drainage, fever/chills or erythema to the right foot wounds. He has no complaints today 4/25; patient presents for 1 week follow-up. He has a total contact cast of the left leg and reports no issues. He has been using silver alginate to the right foot wound. He denies purulent drainage, fever/chills or erythema to the right foot wounds. 5/2 patient presents for 1 week follow-up. T contact cast on the left. The wound which is on the base of the plantar foot at the base of the fifth metatarsal otal actually looks quite good and dimensions continue to gradually contract. HOWEVER the area on the right lateral fifth metatarsal head is much larger than what I remember from 2 weeks ago. Once more is he has significant levels of hypergranulation. Noteworthy that he had this same hyper granulated response on his wound on the left foot at one point in time. So much so that he I thought there was an underlying fluid collection. Based on this I think this just needs debridement. 5/9; the wound on the left actually continues to be gradually smaller with a healthy surface. Slight amount of drainage and maceration of the skin around but not too bad. However he has a large wound over the right fifth metatarsal head very much in the same configuration as his left foot wound was initially. I used silver nitrate to address the hyper granulated tissue no mechanical debridement 5/16; area on the left foot did not look as healthy this week deeper thick surrounding macerated skin and subcutaneous tissue. The area on the right foot fifth met head was about the same The area on the  right ankle that we identified last week is  completely broken down into an open wound presumably a stocking rubbing issue 5/23; patient has been using a total contact cast to the left side. He has been using silver alginate underneath. He has also been using silver alginate to the right foot wounds. He has no complaints today. He denies any signs of infection. 5/31; the left-sided wound looks some better measure smaller surface granulation looks better. We have been using silver alginate under the total contact cast The large area on his right fifth met head and right dorsal foot look about the same still using silver alginate 6/6; neither side is good as I was hoping although the surface area dimensions are better. A lot of maceration on his left and right foot around the wound edge. Area on the dorsal right foot looks better. He says he was traveling. I am not sure what does the amount of maceration around the plantar wounds may be drainage issues 6/13; in general the wound surfaces look quite good on both sides. Macerated skin and raised edges around the wound required debridement although in general especially on the left the surface area seems improved. The area on the right dorsal ankle is about the same I thought this would not be such a problem to close 6/20; not much change in either wound although the one on the right looks a little better. Both wounds have thick macerated edges to the skin requiring debridements. We have been using silver alginate. The area on the dorsal right ankle is still open I thought this would be closed. 6/28; patient comes in today with a marked deterioration in the right foot wound fifth met head. Wide area of exposed bone this is a drastic change from last time. The area on the left there we have been casting is stagnant. We have been using silver alginate in both wound areas. 7/5; bone culture I did for PCR last time was positive for Pseudomonas, group B strep,  Enterococcus and Staph aureus. There was no suggestion of methicillin resistance or ampicillin resistant genes. This was resistant to tetracycline however He comes into the clinic today with the area over his right plantar fifth metatarsal head which had been doing so well 2 weeks ago completely necrotic feeling bone. I do not know that this is going to be salvageable. The left foot wound is certainly no smaller but it has a better surface and is superficial. 7/8; patient called in this morning to say that his total contact cast was rubbing against his foot. He states he is doing fine overall. He denies signs of infection. 7/12; continued deterioration in the wound over the right fifth metatarsal head crumbling bone. This is not going to be salvageable. The patient agrees and wants to be referred to Dr. Doran Durand which we will attempt to arrange as soon as possible. I am going to continue him on antibiotics as long as that takes so I will renew those today. The area on the left foot which is the base of the fifth metatarsal continues to look somewhat better. Healthy looking tissue no depth no debridement is necessary here. 7/20; the patient was kindly seen by Dr. Doran Durand of orthopedics on 10/19/2020. 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 Towards this end we have taken him out of the total contact cast on the left we will put him in a wrap with Hydrofera  Blue. As I understand things surgery is planned for 7/21 7/27; patient had his surgery last Thursday. 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 Apparently everything went well we did not still disturb that today The area on the left foot actually looks quite good. He has been much less mobile which probably explains this he did not seem to do well in the total contact cast secondary to drainage and maceration I think. We have been using Hydrofera  Blue 11/09/2020 upon evaluation today patient appears to be doing well with regard to his plantar foot ulcer on the left foot. Fortunately there is no evidence of active infection at this time. No fevers, chills, nausea, vomiting, or diarrhea. Overall I think that he is actually doing extremely well. Nonetheless I do believe that he is staying off of this more following the surgery in his right foot that is the reason the left is doing so great. 8/16; left plantar foot wound. This looks smaller than the last time I saw this he is using Hydrofera Blue. The surgical wound on the right foot is being followed by Dr. Doran Durand we did not look at this today. He has surgical shoes on both feet 8/23; left plantar foot wound not as good this week. Surrounding macerated skin and subcutaneous tissue everything looks moist and wet. I do not think he is offloading this adequately. He is using a surgical shoe Apparently the right foot surgical wound is not open although I did not check his foot 8/31; left plantar foot lateral aspect. Much improved this week. He has no maceration. Some improvement in the surface area of the wound but most impressively the depth is come in we are using silver alginate. The patient is a Product/process development scientist. He is asked that we write him a letter so he can go back to work. I have also tried to see if we can write something that will allow him to limit the amount of time that he is on his foot at work. 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 Hopefully the latter can be avoided 9/6; I believe the patient missed an appointment last week. He arrives in today with a wound looking roughly the same certainly no better. Undermining laterally [place], Max ([id number]) [id number] Page 7 of 22 and also inferiorly. We used molecuLight today in training with the patient's permission.. We  are using silver alginate 9/21 wound is measuring bigger this week although this may have to do with the aggressive circumferential debridement last week in response to the blush fluorescence on the MolecuLight. Culture I did last week showed significant MSSA and E. coli. I put him on Augmentin but he has not started it yet. We are also going to send this for compounded antibiotics at [facility]. There is no evidence of systemic infection 9/29; silver alginate. His Keystone arrived. He is completing Augmentin in 2 days. Offloading in a cam boot. Moderate drainage per our intake staff 10/5; using silver alginate. He has been using his [place]. He has completed his Augmentin. Per our intake nurse still a lot of drainage, far too much to consider a total contact cast. Wound measures about the same. He had the same undermining area that I defined last week from a roughly 11-3. I remove this today 10/12; using silver alginate he is using the [place]. He comes in for a nurse visit hence we are applying  Keystone twice a week. Measuring slightly better today and less notable drainage. Extensive debridement of the wound edge last time 10/18; using topical Keystone and silver alginate and a soft cast. Wound measurements about the same. Drainage was through his soft cast. We are changing this twice a week Tuesdays and Friday 10/25; comes in with moderate drainage. Still using Keystone silver alginate and a soft cast. Wound dimensions completely the same.He has a lot of edema in the left leg he has lymphedema. Asking for [name] to consider wrapping him as he cannot get his stocking on over the soft cast 11/2; comes in with moderate to large drainage slightly smaller in terms of width we have been using [place]. His wound looks satisfactory but not much improvement 11/4; patient presents today for obligatory cast change. Has no issues or complaints today. He denies signs of infection. 11/9; patient traveled this  weekend to [place], was on the cast quite a bit. Staining of the cast with black material from his walking boot. Drainage was not quite as bad as we feared. Using silver alginate and Keystone 11/16; we do not have size for cast therefore we have been putting a soft cast on him since the change on Friday. Still a significant amount of drainage necessitating changing twice a week. We have been using the Keystone at cast changes either hard or soft as well as silver alginate Comes in the clinic with things actually looking fairly good improvement in width. He says his offloading is about the same 02/24/2021 upon evaluation today patient actually comes back in and is doing excellent in regard to his foot ulcer this is significantly smaller even compared to the last visit. The soft cast seems to have done extremely well for him which is great news. I do not see any signs of infection minimal debridement will be needed today. 11/30; left lateral foot much improved half a centimeter improvement in surface area. No evidence of infection. He seems to be doing better with the soft cast in the TCC therefore we will continue with this. He comes back in later in the week for a change with the nurses. This is due to drainage 12/6; no improvement in dimensions. Under illumination some debris on the surface we have been using silver alginate, soft cast. If there is anything optimistic here he seems to have have less drainage 12/13. Dimensions are improved both length and width and slightly in depth. Appears to be quite healthy today. Raised edges of this thick skin and callus around the edges however. He is in a soft cast were bringing him back once for a change on Friday. Drainage is better 12/20. Dimensions are improved. He still has raised edges of thick skin and callus around the edges. We are using a soft cast 12/28; comes in today with thick callus around the wound. Using silver under alginate under a soft cast. I  do not think there is much improvement in any measurement 2023 04/06/2021; patient was put in a total contact cast. Unfortunately not much change in surface area 1/10; not much different still thick callus and skin around the edge in spite of the total contact cast. This was just debrided last week we have been using the [facility] compounded antibiotic and silver alginate under a total contact cast 1/18 the patient's wound on the left side is doing nicely. smaller HOWEVER he comes in today with a wound on the right foot laterally. blister most likely serosangquenous drainage 1/24; the patient  continues to do well in terms of the plantar left foot which is continued to contract using silver alginate under the total contact cast HOWEVER the right lateral foot is bigger with denuded skin around the edges. I used pickups and a #15 scalpel to remove this this looks like the remanence of a large blister. Cannot rule out infection. Culture in this area I did last week showed Staphylococcus lugdunensis few colonies. I am going to try to address this with his [employer] antibiotic that is done so well on the left having linezolid and this should cover the staph 2/1; the patient's wound on his left foot which was the original plantar foot wound thick skin and eschar around the edges even in the total contact cast but the wound surface does not look too bad The real problem is on how his right lateral foot at roughly the base of the fifth metatarsal. The wound is completely necrotic more worrisome than that there is swelling around the edges of this. We have been using silver alginate on both wounds and Keystone on the right foot. Unfortunately I think he is going to require systemic antibiotics while we await cultures. He did not get the x-ray done that we ordered last week [lost the prescription 2/7; disappointingly in the area on the left foot which we are treating with a total contact cast is still not closed  although it is much smaller. He continues to have a lot of callus around the wound edge. -Right lateral foot culture I did last week was negative x-ray also negative for osteomyelitis. 2/15: TCC silver alginate on the left and silver alginate on the right lateral. No real improvement in either area 05/26/2021: T oday, the wounds are roughly the same size as at his previous visit, post-debridement. He continues to endorse fairly substantial drainage, particularly on the right. He has been in a total contact cast on the left. There is still some callus surrounding this lesion. On the right, the periwound skin is quite macerated, along with surrounding callus. The center of the right-sided wound also has some dark, densely adherent material, which is very difficult to remove. 06/02/2021: Today, both wounds are slightly smaller. He has been using zinc oxide ointment around the right ulcer and the degree of maceration has improved markedly. There continues to be an area of nonviable tissue in the center of the right sided ulcer. The left-sided wound, which has been in the total contact cast. Appears clean and the degree of callus around it is less than previously. 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 He had to take the stairs and both wounds have increased in size. The left foot, which has been in a total contact cast, has developed a tunnel tracking to the lateral aspect of his foot. The nonviable tissue in the center of the right-sided ulcer remains recalcitrant to debridement. There is significant undermining surrounding the entirety of the left sided wound. 06/16/2021: The elevator at school has been fixed and the patient has been able to avoid putting as much weight on his wounds over the past week. We converted the left foot wound into a single lesion today, but despite this, the wound is actually smaller. The base is healthy with  limited periwound callus. On the right, the central necrotic area is still present. He continues to be quite macerated around the right sided wound, despite applying barrier cream. This does, however, have  the benefit of softening the callus to make it more easily removable. 06/23/2021: Today, the left wound is smaller. The lateral aspect that had opened up previously is now closed. The wound base has a healthy bed of granulation tissue and minimal slough. Unfortunately, on the right, the wound is larger and continues to be fairly macerated. He has also reopened the wound at his right ankle. He thinks this is due to the gait he has adopted secondary to his total contact cast and boot. 06/30/2021: T oday, both wounds are a little bit larger. The lateral aspect on the left has remained closed. He continues to have significant periwound maceration. The culture that I took from the right sided wound grew a population of bacteria that is not covered by his current [facility] antibiotic. The center of the right- sided wound continues to appear necrotic with nonviable fat. It probes deeper today, but does not reach bone. [name], Max ([id number]) [id number] Page 8 of 22 07/07/2021: The periwound maceration is a little bit less today. The right lateral foot wound has some areas that appear more viable and the necrotic center also looks a little bit better. The wound on the dorsal surface of his right foot near the ankle is contracting and the surface appears healthy. The left plantar wound surface looks healthy, but there is some new undermining on the medial portion. He did get his new Keystone antibiotic and began applying that to the right foot wound on Saturday. 07/14/2021: The intake nurse reported substantial drainage from his wounds, but the periwound skin actually looks better than is typical for him. The wound on the dorsal surface of his right foot near the ankle is smaller  and just has a small open area underneath some dried eschar. The left plantar wound surface looks healthy and there has been no significant accumulation of callus. The right lateral foot wound looks quite a bit better, with the central portion, which typically appears necrotic, looking more viable albeit pale. 07/22/2021: His left foot is extremely macerated today. The wound is about the same size. The wound on the dorsal surface of his right foot near the ankle had closed, but he traumatized it removing the dressing and there is a tiny skin tear in that location. The right lateral foot wound is bigger, but the surface appears healthy. 07/30/2021: The wound on the dorsal surface of his right foot near the ankle is closed. The right lateral foot wound again is a little bit bigger due to some undermining. The periwound skin is in better condition, however. He has been applying zinc oxide. The wound surface is a little bit dry today. On the left, he does not have the substantial maceration that we frequently see. The wound itself is smaller and has a clean surface. 08/06/2021: Both wounds seem to have deteriorated over the past week. The right lateral foot wound has a dry surface but the periwound is boggy.. Overall wound dimensions are about the same. On the left, the wound is about the same size, but there is more undermining present underneath periwound callus. 08/13/2021: The right sided wound looks about the same, but on the left there has been substantial deterioration. The undermining continues to extend under periwound callus. Once this was removed, substantial extension of the wound was present. There is no odor or purulent drainage but clearly the wounds have broken down. 08/20/2021: The wounds look about the same today. He has been out of his total contact cast and has  just been changing the dressings using topical Keystone with PolyMem Ag, Kerlix and Ace bandages. The wound on the top of his right  ankle has reopened but this is quite small. There was a little bit of purulent material that I expressed when examining this wound. 08/24/2021: After the aggressive debridement I performed at his last visit, the wounds actually look a little bit better today. They are smaller with the exception of the wound on the top of his right ankle which is a little bit bigger as some more skin pulled off when he was changing his dressing. We are using topical Keystone with PolyMem Ag Kerlix and Ace bandages. 09/02/2021: There has been really no change to any of his wounds. 09/16/2021: The patient was hospitalized last week with nausea, vomiting, and dehydration. He says that while he was in the hospital, his wounds were not really addressed properly. T oday, both plantar foot wounds are larger and the periwound skin is macerated. The wound on the dorsum of his right foot has a scab on the top. The right foot now has a crater where previously he had had nonviable fat. It looks as though this simply died and fell out. The periwound callus is wet. 09/24/2021: His wounds have deteriorated somewhat since his last visit. The wound on the dorsum of his right foot near his ankle is larger and has more nonviable tissue present. The crater in his right foot is even deeper; I cannot quite palpate or probe to bone but I am sure it is close. The wound on his left plantar foot has an odd boggy area in the center that almost feels as though it has fluid within it. He has run out of his topical Keystone antibiotic. We are using silver alginate on his wounds. 09/29/2021: He has developed a new wound on the dorsum of his left foot near his ankle. He says he thinks his wrapping is rubbing in that site. I would concur with this as the wound on his right ankle is larger. The left foot looks about the same. The right foot has the crater that was present last week. No significant slough accumulation, but his foot remains quite swollen and  warm despite oral antibiotic therapy. 10/08/2021: All of his wounds look about the same as last week. He did not start his oral antibiotics that are prescribed until just a couple of days ago; his [employer] compounded antibiotics formula has been changed and he is awaiting delivery of the new recipe. His MRI that was scheduled for earlier this week was canceled as no prior authorization had been obtained; unfortunately the tech responsible sent an email to my old [employer] email, which I no longer use nor have access to. 10/18/2021: The wounds on his bilateral dorsal feet near the ankles are both improved. They are smaller and have just some eschar and slough buildup. The left plantar wound has a fair amount of undermining, but the surface is clean. There is some periwound callus accumulation. On the right plantar foot, there is nonviable fat leading to a deep tunnel that tracks towards his dorsal medial foot. There is periwound callus and slough accumulation, as well. His right foot and leg remain swollen as compared to the left. 10/25/2021: The wounds on his bilateral dorsal feet and at the ankles have broken down somewhat. They are little bit larger than last week. The left plantar wound continues to undermine laterally but the surface is clean. The right plantar foot wound  shows some decreased depth in the tunnel tracking towards his dorsal medial foot. He has not yet had the Doppler study that I ordered; it sounds like there is some confusion about the scheduling of the procedure. In addition, the MRI was denied and I have taken steps to appeal the denial. 11/24/2021: Since our last visit, Mr. Stayer was admitted to the hospital where an MRI suggested osteomyelitis. He was taken the operating room by podiatry. Bone biopsies were negative for osteomyelitis. They debrided his wounds and applied myriad matrix. He saw them last week and they removed his staples. He is here today to continue his wound  healing process. T oday, both of the dorsal foot/ankle wounds are substantially smaller. There is just a little eschar overlying the left sided wound and some eschar and slough on the right. The right plantar foot ulcer has the healthiest surface of granulation tissue that I have seen to date. A portion of the myriad matrix failed to take and was hanging loose. It appears that myriad morcells were placed into the tunnel closest to the dorsal portion of his foot. These have sloughed off. The left plantar foot ulcer is about the same size, but has a much healthier surface than in the past. Both plantar ulcers have callus and slough accumulation. 12/07/2021: Left dorsal foot/ankle wound is closed. The right dorsal foot/ankle wound is nearly closed and just has a small open area with some eschar and slough. The right plantar foot wound has contracted quite a bit since our last visit. It has a healthy surface with just a little bit of slough accumulation and periwound callus buildup. The left plantar foot wound is about the same size but the surface appears healthy. There is a little slough and periwound callus on this side, as well. 12/15/2021: Both dorsal foot/ankle wounds are closed. The right plantar foot wound is substantially smaller than at our last visit. The tunneling that was present has nearly closed. There is just a little bit of slough buildup. The left plantar foot wound is also a little bit smaller today. The surface is the healthiest that I have ever seen it. Light slough and periwound callus accumulation on this side. 12/22/2021: The dorsal ankle/foot wounds remain closed. The right plantar foot wound continues to contract. There is still a bit of depth at the lateral portion of the wound but the surface has a good granulated appearance. The wound on the left is about the same size, the central indented portion is still adherent. It also is clean with good granulation tissue present. The  periwound skin and callus are a bit macerated, however. 12/29/2021: Both ankle wounds remain closed. The right plantar foot wound is less than half the size that it was last week. There is no depth any longer and the surface has just a little bit of slough and periwound callus. On the left, the wound is not much smaller, but the surface is healthier with good granulation tissue. There is also a little slough and periwound callus buildup. 01/05/2022: The right plantar foot wound continues to contract and is once again about half the size as it was last week. There is just a little bit of surface slough and periwound callus. On the left, the depth of the wound has decreased and the diameter has started to contract. It is also very clean with just a little slough and biofilm present. [name], Max ([id number]) [id number] Page 9 of 22 01/12/2022: The right plantar foot wound is  smaller again today. There is just some periwound callus and slough accumulation. On the left, there is a fair amount of undermining and the surface is a little boggy, but no other significant change. 01/19/2022: The right plantar wound continues to contract. There is minimal periwound callus accumulation and just a light layer of slough on the surface. On the left, the surface looks better, but there is fairly significant undermining near the 11:00 portion of the wound, aiming towards his great toe. The skin overlying this area is very healthy and has a good fat layer present. No malodor or purulent drainage. 01/26/2022: The right sided wound continues to contract and has just a light layer of slough on the surface. Minimal periwound callus. On the left, he still has substantial undermining and the tissue surface is not as robust as I would like to see. No malodor or purulent drainage, but he did say he had a lot of serous drainage over the weekend. Electronic Signature(s) Signed: 01/26/2022 9:32:39  AM By: [name] MD FACS Entered By: [name] on 01/26/2022 09:32:39 -------------------------------------------------------------------------------- Physical Exam Details Patient Name: Date of Service: [name], [name] 01/26/2022 8:15 A M Medical Record Number: [id number] Patient Account Number: [id number] Date of Birth/Sex: Treating RN: May 09, 1985 (36 y.o. M) Primary Care Provider: [name] Other Clinician: Referring Provider: Treating Provider/Extender: [name] in Treatment: 117 Constitutional . . . . . No acute distress.Max Kitchen Respiratory Normal work of breathing on room air.. Notes 01/26/2022: The right sided wound continues to contract and has just a light layer of slough on the surface. Minimal periwound callus. On the left, he still has substantial undermining and the tissue surface is not as robust as I would like to see. No malodor or purulent drainage, but he did say he had a lot of serous drainage over the weekend Electronic Signature(s) Signed: 01/26/2022 9:36:09 AM By: [name] MD FACS Entered By: [name] on 01/26/2022 09:36:09 -------------------------------------------------------------------------------- Physician Orders Details Patient Name: Date of Service: [name], [name] 01/26/2022 8:15 A M Medical Record Number: [id number] Patient Account Number: [id number] Date of Birth/Sex: Treating RN: Dec 26, 1985 (36 y.o. [name] Primary Care Provider: [name] Other Clinician: Referring Provider: Treating Provider/Extender: [name] in Treatment: [phone] Verbal / Phone Orders: No Diagnosis Coding ICD-10 Coding Code Description E11.621 Type 2 diabetes mellitus with foot ulcer L97.528 Non-pressure chronic ulcer of other part of left foot with other specified severity L97.518 Non-pressure chronic ulcer of other part of right foot with other specified  severity Williamsen, Max ([id number]) [id number] Page 10 of 22 Follow-up Appointments ppointment in 1 week. - Dr. [name] - Room 1 with [name] Return A Wed. 11/1 @ 08:15 am Bathing/ Shower/ Hygiene May shower and wash wound with soap and water. Edema Control - Lymphedema / SCD / Other Bilateral Lower Extremities Elevate legs to the level of the heart or above for 30 minutes daily and/or when sitting, a frequency of: - throughout the day Avoid standing for long periods of time. Exercise regularly Compression stocking or Garment 20-30 mm/Hg pressure to: - to both legs daily Off-Loading Open toe surgical shoe to: - Both feet Additional Orders / Instructions Follow Nutritious Diet Wound Treatment Wound #3 - Foot Wound Laterality: Plantar, Left, Lateral Cleanser: Soap and Water 1 x Per Day/30 Days Discharge Instructions: May shower and wash wound with dial antibacterial soap and water prior to dressing change. Peri-Wound Care: Zinc Oxide Ointment 30g tube  1 x Per Day/30 Days Discharge Instructions: Apply Zinc Oxide to periwound with each dressing change Peri-Wound Care: Sween Lotion (Moisturizing lotion) 1 x Per Day/30 Days Discharge Instructions: Apply moisturizing lotion as directed Prim Dressing: Hydrofera Blue Ready Foam, 4x5 in (Generic) 1 x Per Day/30 Days ary Discharge Instructions: Apply to wound bed as instructed Secondary Dressing: ABD Pad, 8x10 (Generic) 1 x Per Day/30 Days Discharge Instructions: Apply over primary dressing as directed. Secondary Dressing: Optifoam Non-Adhesive Dressing, 4x4 in (Generic) 1 x Per Day/30 Days Discharge Instructions: Apply over primary dressing as directed. Secondary Dressing: Woven Gauze Sponge, Non-Sterile 4x4 in (Generic) 1 x Per Day/30 Days Discharge Instructions: Apply over primary dressing as directed. Secondary Dressing: Zetuvit Absorbent Pad, 4x4 (in/in) (Dispense As Written) 1 x Per Day/30 Days Secured With:  77M Medipore H Soft Cloth Surgical T ape, 4 x 10 (in/yd) (Dispense As Written) 1 x Per Day/30 Days Discharge Instructions: Secure with tape as directed. Compression Wrap: Kerlix Roll 4.5x3.1 (in/yd) (Generic) 1 x Per Day/30 Days Discharge Instructions: Apply Kerlix and Coban compression as directed. Compression Wrap: 77M ACE Elastic Bandage With VELCRO Brand Closure, 4 (in) (Generic) 1 x Per Day/30 Days Wound #8 - Foot Wound Laterality: Right, Lateral Cleanser: Soap and Water 1 x Per Day/30 Days Discharge Instructions: May shower and wash wound with dial antibacterial soap and water prior to dressing change. Peri-Wound Care: Sween Lotion (Moisturizing lotion) 1 x Per Day/30 Days Discharge Instructions: Apply moisturizing lotion as directed Prim Dressing: Promogran Prisma Matrix, 4.34 (sq in) (silver collagen) (Generic) 1 x Per Day/30 Days ary Discharge Instructions: Moisten collagen with saline or hydrogel Secondary Dressing: ABD Pad, 8x10 (Generic) 1 x Per Day/30 Days Discharge Instructions: Apply over primary dressing as directed. Secondary Dressing: Optifoam Non-Adhesive Dressing, 4x4 in (Generic) 1 x Per Day/30 Days Discharge Instructions: Apply over primary dressing as directed. Secondary Dressing: Woven Gauze Sponge, Non-Sterile 4x4 in (Generic) 1 x Per Day/30 Days Discharge Instructions: Apply over primary dressing as directed. Secondary Dressing: Zetuvit Absorbent Pad, 4x4 (in/in) (Dispense As Written) 1 x Per Day/30 Days Secured With: 77M Medipore H Soft Cloth Surgical T ape, 4 x 10 (in/yd) (Dispense As Written) 1 x Per Day/30 Days Discharge Instructions: Secure with tape as directed. Compression Wrap: Kerlix Roll 4.5x3.1 (in/yd) (Generic) 1 x Per Day/30 Days [name], Max ([id number]) [phone].pdf Page 11 of 22 Discharge Instructions: Apply Kerlix and Coban compression as directed. Compression Wrap: 77M ACE Elastic Bandage With VELCRO Brand Closure, 4 (in)  (Generic) 1 x Per Day/30 Days Electronic Signature(s) Signed: 01/26/2022 12:04:38 PM By: [name] MD FACS Entered By: [name] on 01/26/2022 09:36:22 -------------------------------------------------------------------------------- Problem List Details Patient Name: Date of Service: [name], [name] 01/26/2022 8:15 A M Medical Record Number: [id number] Patient Account Number: [id number] Date of Birth/Sex: Treating RN: [birth year] (36 y.o. [name] Primary Care Provider: [name] Other Clinician: Referring Provider: Treating Provider/Extender: [name] in Treatment: 117 Active Problems ICD-10 Encounter Code Description Active Date MDM Diagnosis E11.621 Type 2 diabetes mellitus with foot ulcer 10/24/2019 No Yes L97.528 Non-pressure chronic ulcer of other part of left foot with other specified 10/24/2019 No Yes severity L97.518 Non-pressure chronic ulcer of other part of right foot with other specified 10/24/2019 No Yes severity Inactive Problems ICD-10 Code Description Active Date Inactive Date L97.518 Non-pressure chronic ulcer of other part of right foot with other specified severity 07/14/2020 07/14/2020 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, right ankle and foot 10/24/2019 10/24/2019 L97.318 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 08/10/2020 08/10/2020 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, left ankle and foot 10/24/2019 10/24/2019 L97.322 Non-pressure chronic ulcer of left ankle with fat layer exposed 09/29/2021 09/29/2021 B95.62 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 10/24/2019 10/24/2019 classified elsewhere Resolved Problems Electronic Signature(s) [name], Max ([id number]) [id number] Page 12 of 22 Signed: 01/26/2022 9:27:05 AM By: [name] MD FACS Entered By: [name] on 01/26/2022  09:27:05 -------------------------------------------------------------------------------- Progress Note Details Patient Name: Date of Service: [name], [name] 01/26/2022 8:15 A M Medical Record Number: [id number] Patient Account Number: [id number] Date of Birth/Sex: Treating RN: 23-Jun-1985 (36 y.o. 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 He comes in for review of a wound over the plantar fifth metatarsal head extending into the lateral part of the foot. He was followed for this previously by his podiatrist Dr. Cornelius Moras. As the patient tells his story he went to see podiatry first for a swelling he developed on the lateral part of his fifth metatarsal head in May. He states this was "open" by podiatry and the area closed. He was followed up in June and it was again opened callus removed and it closed promptly. There were plans being made for surgery on the fifth metatarsal head in June however his blood sugar was apparently too high for anesthesia. Apparently the area was debrided and opened again in June and it is never closed since. Looking over the records from podiatry I am really not able to follow this. It was clear when he was first seen it was before 5/14 at that point he already had a wound. By 5/17 the ulcer was resolved. I do not see anything about a procedure. On 5/28 noted to have pre-ulcerative  moderate keratosis. X-ray noted 1/5 contracted toe and tailor's bunion and metatarsal deformity. On a visit date on 09/28/2018 the dorsal part of the left foot it healed and resolved. There was concern about swelling in his lower extremity he was sent to the ER.. As far as I can tell he was seen in the ER on 7/12 with an ulcer on his left foot. A DVT rule out of the left leg was negative. I do not think I have complete records from podiatry but I am not able to verify the procedures this patient states he had. He states after the last procedure the wound has never closed although I am not able to follow this in the records I have from podiatry. He has not had a recent x-ray The patient has been using Neosporin on the wound. He is wearing a Darco shoe. He is still very active up on his foot working and exercising. Past medical history; type 2 diabetes ketosis-prone, leg swelling with a negative DVT study in July. Non-smoker ABI in our clinic was 0.85 on the left 10/16; substantial wound on the plantar left fifth met head extending laterally almost to the dorsal fifth MTP. We have been using silver alginate we gave him  a Darco forefoot off loader. An x-ray did not show evidence of osteomyelitis did note soft tissue emphysema which I think was due to gas tracking through an open wound. There is no doubt in my mind he requires an MRI 10/23; MRI not booked until 3 November at the earliest this is largely due to his glucose sensor in the right arm. We have been using silver alginate. There has been an improvement 10/29; I am still not exactly sure when his MRI is booked for. He says it is the third but it is the 10th in epic. This definitely needs to be done. He is running a low-grade fever today but no other symptoms. No real improvement in the 1 02/26/2019 patient presents today for a follow-up visit here in our clinic he is last been seen in the clinic on October 29. Subsequently we were working  on getting MRI to evaluate and see what exactly was going on and where we would need to go from the standpoint of whether or not he had osteomyelitis and again what treatments were going be required. Subsequently the patient ended up being admitted to the hospital on 02/07/2019 and was discharged on 02/14/2019. This is a somewhat interesting admission with a discharge diagnosis of pneumonia due to COVID-19 although he was positive for COVID-19 when tested at the urgent care but negative x2 when he was actually in the hospital. With that being said he did have acute respiratory failure with hypoxia and it was noted he also have a left foot ulceration with osteomyelitis. With that being said he did require oxygen for his pneumonia and I level 4 L. He was placed on antivirals and steroids for the COVID-19. He was also transferred to the [facility] at one point. Nonetheless he did subsequently discharged home and since being home has done much better in that regard. The CT angiogram did not show any pulmonary embolism. With regard to the osteomyelitis the patient was placed on vancomycin and Zosyn while in the hospital but has been changed to Augmentin at discharge. It was also recommended that he follow- up with wound care and podiatry. Podiatry however wanted him to see [name] according to the patient prior to them doing anything further. His hemoglobin A1c was 9.9 as noted in the hospital. Have an MRI of the left foot performed while in the hospital on 02/04/2019. 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 There is an overlying plantar open wound noted an abscess tracking back along the lateral aspect of the fifth metatarsal shaft. There is otherwise diffuse cellulitis and mild fasciitis without findings of polymyositis. The patient did have recently pneumonia secondary to COVID-19 I looked in the chart  through epic and it does appear that the patient may need to have an additional x-ray just to ensure everything is cleared and that he has no airspace disease prior to putting him into the [name]. 03/05/2019; patient was readmitted to the clinic last week. He was hospitalized twice for a viral upper respiratory tract infection from 11/1 through 11/4 and then 11/5 through 11/12 ultimately this turned out to be Covid pneumonitis. Although he was discharged on oxygen he is not using it. He says he feels fine. He has no exercise limitation no cough no sputum. His O2 sat in our clinic today was 100% on room air. He did manage to have his MRI which showed septic arthritis  at the fifth MTP joint and osteomyelitis involving the fifth metatarsal head and proximal phalanx. He received Vanco and Zosyn in the hospital and then was discharged on 2 weeks of Augmentin. I do not see any relevant cultures. He was supposed to follow-up with infectious disease but I do not see that he has an appointment. 12/8; patient saw Dr. Novella Olive of infectious disease last week. He felt that he had had adequate antibiotic therapy. He did not go to follow-up with Dr. Amalia Hailey of podiatry and I have again talked to him about the pros and cons of this. He does not want to consider a ray amputation of this time. He is aware of the risks of recurrence, migration etc. He started HBO today and tolerated this well. He can complete the Augmentin that I gave him last week. I have looked over the lab work that Dr. Chana Bode ordered his C-reactive protein was 3.3 and his sedimentation rate was 17. The C-reactive protein is never really been measurably that high in this patient [name], Max ([id number]) [id number] Page 13 of 22 12/15; not much change in the wound today however he has undermining along the lateral part of the foot again more extensively than last week. He has some rims of epithelialization. We have been  using silver alginate. He is undergoing hyperbarics but did not dive today 12/18; in for his obligatory first total contact cast change. Unfortunately there was pus coming from the undermining area around his fifth metatarsal head. This was cultured but will preclude reapplication of a cast. He is seen in conjunction with HBO 12/24; patient had staph lugdunensis in the wound in the undermining area laterally last time. We put him on doxycycline which should have covered this. The wound looks better today. I am going to give him another week of doxycycline before reattempting the total contact cast 12/31; the patient is completing antibiotics. Hemorrhagic debris in the distal part of the wound with some undermining distally. He also had hyper granulation. Extensive debridement with a #5 curette. The infected area that was on the lateral part of the fifth met head is closed over. I do not think he needs any more antibiotics. Patient was seen prior to HBO. Preparations for a total contact cast were made in the cast will be placed post hyperbarics 04/11/19; once again the patient arrives today without complaint. He had been in a cast all week noted that he had heavy drainage this week. This resulted in large raised areas of macerated tissue around the wound 1/14; wound bed looks better slightly smaller. Hydrofera Blue has been changing himself. He had a heavy drainage last week which caused a lot of maceration around the wound so I took him out of a total contact cast he says the drainage is actually better this week He is seen today in conjunction with HBO 1/21; returns to clinic. He was up in [place] for a day or 2 attending a funeral. He comes back in with the wound larger and with a large area of exposed bone. He had osteomyelitis and septic arthritis of the fifth left metatarsal head while he was in hospital. He received IV antibiotics in the hospital for a prolonged period of time then 3 weeks of  Augmentin. Subsequently I gave him 2 weeks of doxycycline for more superficial wound infection. When I saw this last week the wound was smaller the surface of the wound looks satisfactory. 1/28; patient missed hyperbarics today. Bone biopsy I did last time  showed Enterococcus faecalis and Staphylococcus lugdunensis . He has a wide area of exposed bone. We are going to use silver alginate as of today. I had another ethical discussion with the patient. 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 In this situation I think the likelihood of healing this is low. Therefore I have recommended a ray amputation and with the patient's agreement I have referred him to Dr. Doran Durand. The other issue is that his compliance with hyperbarics has been minimal because of his work schedule and given his underlying decision I am going to stop this today READMISSION 10/24/2019 MRI 09/29/2019 left foot IMPRESSION: 1. Apparent skin ulceration inferior and lateral to the 5th metatarsal base with underlying heterogeneous T2 signal and enhancement in the subcutaneous fat. Small peripherally enhancing fluid collections along the plantar and lateral aspects of the 5th metatarsal base suspicious for abscesses. 2. Interval amputation through the mid 5th metatarsal with nonspecific low-level marrow edema and enhancement. Given the proximity to the adjacent soft tissue inflammatory changes, osteomyelitis cannot be excluded. 3. The additional bones appear unremarkable. MRI 09/29/2019 right foot IMPRESSION: 1. Soft tissue ulceration lateral to the 5th MTP joint. There is low-level T2 hyperintensity within the 4th and 5th metatarsal heads and adjacent proximal phalanges without abnormal T1 signal or cortical destruction. These findings are nonspecific and could be seen with early marrow edema, hyperemia or early osteomyelitis. No evidence of septic joint. 2. Mild tenosynovitis and synovial  enhancement associated with the extensor digitorum tendons at the level of the midfoot. 3. Diffuse low-level muscular T2 hyperintensity and enhancement, most consistent with diabetic myopathy. LEFT FOOT BONE Methicillin resistant staphylococcus aureus Staphylococcus lugdunensis MIC MIC CIPROFLOXACIN >=8 RESISTANT Resistant <=0.5 SENSI... Sensitive CLINDAMYCIN <=0.25 SENS... Sensitive >=8 RESISTANT Resistant ERYTHROMYCIN >=8 RESISTANT Resistant >=8 RESISTANT Resistant GENTAMICIN <=0.5 SENSI... Sensitive <=0.5 SENSI... Sensitive Inducible Clindamycin NEGATIVE Sensitive NEGATIVE Sensitive OXACILLIN >=4 RESISTANT Resistant 2 SENSITIVE Sensitive RIFAMPIN <=0.5 SENSI... Sensitive <=0.5 SENSI... Sensitive TETRACYCLINE <=1 SENSITIVE Sensitive <=1 SENSITIVE Sensitive TRIMETH/SULFA <=10 SENSIT Sensitive <=10 SENSIT Sensitive ... Max Kitchen.. VANCOMYCIN 1 SENSITIVE Sensitive <=0.5 SENSI... Sensitive Right foot bone . Component 3 wk ago Specimen Description BONE Special Requests RIGHT 4 METATARSAL SAMPLE B Gram Stain NO WBC SEEN NO ORGANISMS SEEN Culture RARE METHICILLIN RESISTANT STAPHYLOCOCCUS AUREUS NO ANAEROBES ISOLATED Performed at [facility], [place] [address] [place], [place] [address] Report Status 10/08/2019 FINAL Organism ID, Bacteria METHICILLIN RESISTANT STAPHYLOCOCCUS AUREUS Resulting Agency [facility] Susceptibility Methicillin resistant staphylococcus aureus MIC [name], Max ([id number]) [id number] Page 14 of 22 CIPROFLOXACIN >=8 RESISTANT Resistant CLINDAMYCIN <=0.25 SENS... Sensitive ERYTHROMYCIN >=8 RESISTANT Resistant GENTAMICIN <=0.5 SENSI... Sensitive Inducible Clindamycin NEGATIVE Sensitive OXACILLIN >=4 RESISTANT Resistant RIFAMPIN <=0.5 SENSI... Sensitive TETRACYCLINE <=1 SENSITIVE Sensitive TRIMETH/SULFA <=10 SENSIT Sensitive ... VANCOMYCIN 1 SENSITIVE Sensitive This is a patient we had in clinic earlier this year with a wound  over his left fifth metatarsal head. 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 Doran Durand on 05/16/2019. He seems to have developed problems with wounds on his bilateral feet in June 2021 although he may have had problems earlier than this. He was in an urgent care with a right foot ulcer on 09/26/2019 and given a course of doxycycline. This was  apparently after having trouble getting into see orthopedics. He was seen by podiatry on 09/28/2019 noted to have bilateral lower extremity ulcers including the left lateral fifth metatarsal base and the right subfifth met head. It was noted that had purulent drainage at that time. He required hospitalization from 6/20 through 7/2. This was because of worsening right foot wounds. He underwent bilateral operative incision and drainage and bone biopsies bilaterally. Culture results are listed above. He has been referred back to clinic by Dr. Jacqualyn Posey of podiatry. He is also followed by Dr. Megan Salon who saw him yesterday. He was discharged from hospital on Zyvox Flagyl and Levaquin and yesterday changed to doxycycline Flagyl and Levaquin. His inflammatory markers on 6/26 showed a sedimentation rate of 129 and a C-reactive protein of 5. This is improved to 14 and 1.3 respectively. This would indicate improvement. ABIs in our clinic today were 1.23 on the right and 1.20 on the left 11/01/2019 on evaluation today patient appears to be doing fairly well in regard to the wounds on his feet at this point. Fortunately there is no signs of active infection at this time. No fevers, chills, nausea, vomiting, or diarrhea. He currently is seeing infectious disease and still under their care at this  point. Subsequently he also has both wounds which she has not been using collagen on as he did not receive that in his packaging he did not call us and let us know that. Apparently that just was missed on the order. Nonetheless we will get that straightened out today. 8/9-Patient returns for bilateral foot wounds, using Prisma with hydrogel moistened dressings, and the wounds appear stable. Patient using surgical shoes, avoiding much pressure or weightbearing as much as possible 8/16; patient has bilateral foot wounds. 1 on the right lateral foot proximally the other is on the left mid lateral foot. Both required debridement of callus and thick skin around the wounds. We have been using silver collagen 8/27; patient has bilateral lateral foot wounds. The area on the left substantially surrounded by callus and dry skin. This was removed from the wound edge. The underlying wound is small. The area on the right measured somewhat smaller today. 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 Unfortunately I did not update his antibiotics during today's visit. 9/10 I reviewed Dr. Hale Bogus last notes he felt he had completed antibiotics his inflammatory markers were reasonably well controlled. He has a small wound on the lateral left foot and a tiny area on the right which is just above closed. He is using Hydrofera Blue with border foam he has bilateral surgical shoes 9/24; 2 week f/u. doing well. right foot is closed. left foot still undermined. 10/14; right foot remains closed at the fifth met head. The area over the base of the left fifth metatarsal has a small open area but considerable undermining towards the plantar foot. Thick callus skin around this suggests an adequate pressure relief. We have talked about this. He says he is going to go back into his cam boot. I suggested a total contact cast he did not seem enamored with this  suggestion 10/26; left foot base of the fifth metatarsal. Same condition as last time. He has skin over the area with an open wound however the skin is not adherent. He went to see Dr. Earleen Newport who did an x-ray and culture of his foot I have not reviewed the x-ray but the patient was not told  anything. He is on doxycycline 11/11; since the patient was last here he was in the emergency room on 10/30 he was concerned about swelling in the left foot. They did not do any cultures or x-rays. They changed his antibiotics to cephalexin. Previous culture showed group B strep. The cephalexin is appropriate as doxycycline has less than predictable coverage. Arrives in clinic today with swelling over this area under the wound. He also has a new wound on the right fifth metatarsal head 11/18; the patient has a difficult wound on the lateral aspect of the left fifth metatarsal head. The wound was almost ballotable last week I opened it slightly expecting to see purulence however there was just bleeding. I cultured this this was negative. X-ray unchanged. We are trying to get an MRI but I am not sure were going to be able to get this through his insurance. He also has an area on the right lateral fifth metatarsal head this looks healthier 12/3; the patient finally got our MRI. Surprisingly this did not show osteomyelitis. I did show the soft tissue ulceration at the lateral plantar aspect of the fifth metatarsal base with a tiny residual 6 mm abscess overlying the superficial fascia I have tried to culture this area I have not been able to get this to grow anything. Nevertheless the protruding tissue looks aggravated. I suspect we should try to treat the underlying "abscess with broad-spectrum antibiotics. I am going to start him on Levaquin and Flagyl. He has much less edema in his legs and I am going to continue to wrap his legs and see him weekly 12/10. I started Levaquin and Flagyl on him last week. He just picked  up the Flagyl apparently there was some delay. The worry is the wound on the left fifth metatarsal base which is substantial and worsening. His foot looks like he inverts at the ankle making this a weightbearing surface. Certainly no improvement in fact I think the measurements of this are somewhat worse. We have been using 12/17; he apparently just got the Levaquin yesterday this is 2 weeks after the fact. He has completed the Flagyl. The area over the left fifth metatarsal base still has protruding granulation tissue although it does not look quite as bad as it did some weeks ago. He has severe bilateral lymphedema although we have not been treating him for wounds on his legs this is definitely going to require compression. There was so much edema in the left I did not wish to put him in a total contact cast today. I am going to increase his compression from 3-4 layer. The area on the right lateral fifth met head actually look quite good and superficial. 12/23; patient arrived with callus on the right fifth met head and the substantial hyper granulated callused wound on the base of his fifth metatarsal. He says he is completing his Levaquin in 2 days but I do not think that adds up with what I gave him but I will have to double check this. We are using Hydrofera Blue on both areas. My plan is to put the left leg in a cast the week after New Year's 04/06/2020; patient's wounds about the same. Right lateral fifth metatarsal head and left lateral foot over the base of the fifth metatarsal. There is undermining on the left lateral foot which I removed before application of total contact cast continuing with Hydrofera Blue new. Patient tells me he was seen by endocrinology today lab work was  done [Dr. Kerr]. Also wondering whether he was referred to cardiology. I went over some lab work from previously does not have chronic renal failure certainly not nephrotic range proteinuria he does have very poorly  controlled diabetes but this is not his most updated lab work. Hemoglobin A1c has been over 11 1/10; the patient had a considerable amount of leakage towards mid part of his left foot with macerated skin however the wound surface looks better the area on the right lateral fifth met head is better as well. I am going to change the dressing on the left foot under the total contact cast to silver alginate, continue with Hydrofera Blue on the right. 1/20; patient was in the total contact cast for 10 days. Considerable amount of drainage although the skin around the wound does not look too bad on the left foot. The area on the right fifth metatarsal head is closed. Our nursing staff reports large amount of drainage out of the left lateral foot wound 1/25; continues with copious amounts of drainage described by our intake staff. PCR culture I did last week showed E. coli and Enterococcus faecalis and low quantities. Multiple resistance genes documented including extended spectrum beta lactamase, MRSA, MRSE, quinolone, tetracycline. The wound is not quite as good this week as it was 5 days ago but about the same size 2/3; continues with copious amounts of malodorous drainage per our intake nurse. The PCR culture I did 2 weeks ago showed E. coli and low quantities of Enterococcus. There were multiple resistance genes detected. I put Neosporin on him last week although this does not seem to have helped. The wound is slightly deeper today. Offloading continues to be an issue here although with the amount of drainage she has a total contact cast is just not going to work 2/10; moderate amount of drainage. Patient reports he cannot get his stocking on over the dressing. I told him we have to do that the nurse gave him suggestions on how to make this work. The wound is on the bottom and lateral part of his left foot. Is cultured predominantly grew low amounts of Enterococcus, E. coli and anaerobes. There were  multiple resistance genes detected including extended spectrum beta lactamase, quinolone, tetracycline. I could not think of an easy oral combination to address this so for now I am going to do topical antibiotics provided by [facility] I think the main agents here are vancomycin and an aminoglycoside. We have to be able to give him access to the wounds to get the topical antibiotic on Hardie, Max ([id number]) [id number] Page 15 of 22 2/17; moderate amount of drainage this is unchanged. He has his Keystone topical antibiotic against the deep tissue culture organisms. He has been using this and changing the dressing daily. Silver alginate on the wound surface. 2/24; using Keystone antibiotic with silver alginate on the top. He had too much drainage for a total contact cast at one point although I think that is improving and I think in the next week or 2 it might be possible to replace a total contact cast I did not do this today. In general the wound surface looks healthy however he continues to have thick rims of skin and subcutaneous tissue around the wide area of the circumference which I debrided 06/04/2020 upon evaluation today patient appears to be doing well in regard to his wound. I do feel like he is showing signs of improvement. There is little bit of callus and dead  tissue around the edges of the wound as well as what appears to be a little bit of a sinus tract that is off to the side laterally I would perform debridement to clear that away today. 3/17; left lateral foot. The wound looks about the same as I remember. Not much depth surface looks healthy. No evidence of infection 3/25; left lateral foot. Wound surface looks about the same. Separating epithelium from the circumference. There really is no evidence of infection here however not making progress by my view 3/29; left lateral foot. Surface of the wound again looks reasonably healthy still thick skin and  subcutaneous tissue around the wound margins. There is no evidence of infection. One of the concerns being brought up by the nurses has again the amount of drainage vis--vis continued use of a total contact cast 4/5; left lateral foot at roughly the base of the fifth metatarsal. Nice healthy looking granulated tissue with rims of epithelialization. The overall wound measurements are not any better but the tissue looks healthy. The only concern is the amount of drainage although he has no surrounding maceration with what we have been doing recently to absorb fluid and protect his skin. He also has lymphedema. 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 He states that the kerlix/coban under the TCC rolled down and could not get it back up. He has been using an offloading scooter and has somehow hurt his right foot using this device. This happened last week. He states that the side of his right foot developed a blister and opened. The top of his foot also has a few small open wounds he thinks is due to his socks rubbing in his shoes. He has not been using any dressings to the wound. He denies purulent drainage, fever/chills or erythema to the wounds. 4/22; patient presents for 1 week follow-up. He developed new wounds to the right foot that were evaluated at last clinic visit. He continues to have a total contact cast to the left leg and he reports no issues. He has been using silver collagen to the right foot wounds with no issues. He denies purulent drainage, fever/chills or erythema to the right foot wounds. He has no complaints today 4/25; patient presents for 1 week follow-up. He has a total contact cast of the left leg and reports no issues. He has been  using silver alginate to the right foot wound. He denies purulent drainage, fever/chills or erythema to the right foot wounds. 5/2 patient presents for 1 week follow-up. T contact cast on the left. The wound which is on the base of the plantar foot at the base of the fifth metatarsal otal actually looks quite good and dimensions continue to gradually contract. HOWEVER the area on the right lateral fifth metatarsal head is much larger than what I remember from 2 weeks ago. Once more is he has significant levels of hypergranulation. Noteworthy that he had this same hyper granulated response on his wound on the left foot at one point in time. So much so that he I thought there was an underlying fluid collection. Based on this I think this just needs debridement. 5/9; the wound on the left actually continues to be gradually smaller with  a healthy surface. Slight amount of drainage and maceration of the skin around but not too bad. However he has a large wound over the right fifth metatarsal head very much in the same configuration as his left foot wound was initially. I used silver nitrate to address the hyper granulated tissue no mechanical debridement 5/16; area on the left foot did not look as healthy this week deeper thick surrounding macerated skin and subcutaneous tissue. oo The area on the right foot fifth met head was about the same oo The area on the right ankle that we identified last week is completely broken down into an open wound presumably a stocking rubbing issue 5/23; patient has been using a total contact cast to the left side. He has been using silver alginate underneath. He has also been using silver alginate to the right foot wounds. He has no complaints today. He denies any signs of infection. 5/31; the left-sided wound looks some better measure smaller surface granulation looks better. We have been using silver alginate under the total contact cast oo The large area on his  right fifth met head and right dorsal foot look about the same still using silver alginate 6/6; neither side is good as I was hoping although the surface area dimensions are better. A lot of maceration on his left and right foot around the wound edge. Area on the dorsal right foot looks better. He says he was traveling. I am not sure what does the amount of maceration around the plantar wounds may be drainage issues 6/13; in general the wound surfaces look quite good on both sides. Macerated skin and raised edges around the wound required debridement although in general especially on the left the surface area seems improved. oo The area on the right dorsal ankle is about the same I thought this would not be such a problem to close 6/20; not much change in either wound although the one on the right looks a little better. Both wounds have thick macerated edges to the skin requiring debridements. We have been using silver alginate. The area on the dorsal right ankle is still open I thought this would be closed. 6/28; patient comes in today with a marked deterioration in the right foot wound fifth met head. Wide area of exposed bone this is a drastic change from last time. The area on the left there we have been casting is stagnant. We have been using silver alginate in both wound areas. 7/5; bone culture I did for PCR last time was positive for Pseudomonas, group B strep, Enterococcus and Staph aureus. There was no suggestion of methicillin resistance or ampicillin resistant genes. This was resistant to tetracycline however He comes into the clinic today with the area over his right plantar fifth metatarsal head which had been doing so well 2 weeks ago completely necrotic feeling bone. I do not know that this is going to be salvageable. The left foot wound is certainly no smaller but it has a better surface and is superficial. 7/8; patient called in this morning to say that his total contact cast was  rubbing against his foot. He states he is doing fine overall. He denies signs of infection. 7/12; continued deterioration in the wound over the right fifth metatarsal head crumbling bone. This is not going to be salvageable. The patient agrees and wants to be referred to Dr. Doran Durand which we will attempt to arrange as soon as possible. I am going to continue him on antibiotics  as long as that takes so I will renew those today. The area on the left foot which is the base of the fifth metatarsal continues to look somewhat better. Healthy looking tissue no depth no debridement is necessary here. 7/20; the patient was kindly seen by Dr. Doran Durand of orthopedics on 10/19/2020. 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 Towards this end we have taken him out of the total contact cast on the left we will put him in a wrap with Hydrofera Blue. As I understand things surgery is planned for 7/21 7/27; patient had his surgery last Thursday. 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 Apparently everything went well we did not still disturb that today The area on the left foot actually looks quite good. He has been much less mobile which probably explains this he did not seem to do well in the total contact cast secondary to drainage and maceration I think. 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 Fortunately there is no evidence of active infection at this time. No fevers, chills, nausea, vomiting, or diarrhea. Overall I think that he is actually doing extremely well. Nonetheless I do believe that he is staying off of this more following the surgery in his right foot that is the reason the left is doing so great. 8/16; left plantar foot wound. This looks smaller than the last time I saw this he is using Hydrofera  Blue. The surgical wound on the right foot is being followed by Dr. Doran Durand we did not look at this today. He has surgical shoes on both feet [name], Max ([id number]) [id number] Page 16 of 22 8/23; left plantar foot wound not as good this week. Surrounding macerated skin and subcutaneous tissue everything looks moist and wet. I do not think he is offloading this adequately. He is using a surgical shoe Apparently the right foot surgical wound is not open although I did not check his foot 8/31; left plantar foot lateral aspect. Much improved this week. He has no maceration. Some improvement in the surface area of the wound but most impressively the depth is come in we are using silver alginate. The patient is a Product/process development scientist. He is asked that we write him a letter so he can go back to work. I have also tried to see if we can write something that will allow him to limit the amount of time that he is on his foot at work. 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 Hopefully the latter can be avoided 9/6; I believe the patient missed an appointment last week. He arrives in today with a wound looking roughly the same certainly no better. Undermining laterally and also inferiorly. We used molecuLight today in training with the patient's permission.. We are using silver alginate 9/21 wound is measuring bigger this week although this may have to do with the aggressive circumferential debridement last week in response to the blush fluorescence on the MolecuLight. Culture I did last week showed significant MSSA and E. coli. I put him on Augmentin but he has not started it yet. We are also going to send this for compounded antibiotics at [facility]. There is no  evidence of systemic infection 9/29; silver alginate. His Keystone arrived. He is completing Augmentin in 2 days. Offloading in a cam  boot. Moderate drainage per our intake staff 10/5; using silver alginate. He has been using his [place]. He has completed his Augmentin. Per our intake nurse still a lot of drainage, far too much to consider a total contact cast. Wound measures about the same. He had the same undermining area that I defined last week from a roughly 11-3. I remove this today 10/12; using silver alginate he is using the [place]. He comes in for a nurse visit hence we are applying [employer] twice a week. Measuring slightly better today and less notable drainage. Extensive debridement of the wound edge last time 10/18; using topical Keystone and silver alginate and a soft cast. Wound measurements about the same. Drainage was through his soft cast. We are changing this twice a week Tuesdays and Friday 10/25; comes in with moderate drainage. Still using Keystone silver alginate and a soft cast. Wound dimensions completely the same.He has a lot of edema in the left leg he has lymphedema. Asking for [name] to consider wrapping him as he cannot get his stocking on over the soft cast 11/2; comes in with moderate to large drainage slightly smaller in terms of width we have been using [place]. His wound looks satisfactory but not much improvement 11/4; patient presents today for obligatory cast change. Has no issues or complaints today. He denies signs of infection. 11/9; patient traveled this weekend to [place], was on the cast quite a bit. Staining of the cast with black material from his walking boot. Drainage was not quite as bad as we feared. Using silver alginate and Keystone 11/16; we do not have size for cast therefore we have been putting a soft cast on him since the change on Friday. Still a significant amount of drainage necessitating changing twice a week. We have been using the Keystone at cast changes either hard or soft as well as silver alginate Comes in the clinic with things actually looking fairly good improvement  in width. He says his offloading is about the same 02/24/2021 upon evaluation today patient actually comes back in and is doing excellent in regard to his foot ulcer this is significantly smaller even compared to the last visit. The soft cast seems to have done extremely well for him which is great news. I do not see any signs of infection minimal debridement will be needed today. 11/30; left lateral foot much improved half a centimeter improvement in surface area. No evidence of infection. He seems to be doing better with the soft cast in the TCC therefore we will continue with this. He comes back in later in the week for a change with the nurses. This is due to drainage 12/6; no improvement in dimensions. Under illumination some debris on the surface we have been using silver alginate, soft cast. If there is anything optimistic here he seems to have have less drainage 12/13. Dimensions are improved both length and width and slightly in depth. Appears to be quite healthy today. Raised edges of this thick skin and callus around the edges however. He is in a soft cast were bringing him back once for a change on Friday. Drainage is better 12/20. Dimensions are improved. He still has raised edges of thick skin and callus around the edges. We are using a soft cast 12/28; comes in today with thick callus around the wound. Using silver under alginate under  a soft cast. I do not think there is much improvement in any measurement 2023 04/06/2021; patient was put in a total contact cast. Unfortunately not much change in surface area 1/10; not much different still thick callus and skin around the edge in spite of the total contact cast. This was just debrided last week we have been using the [facility] compounded antibiotic and silver alginate under a total contact cast 1/18 the patient's wound on the left side is doing nicely. smaller HOWEVER he comes in today with a wound on the right foot laterally. blister  most likely serosangquenous drainage 1/24; the patient continues to do well in terms of the plantar left foot which is continued to contract using silver alginate under the total contact cast HOWEVER the right lateral foot is bigger with denuded skin around the edges. I used pickups and a #15 scalpel to remove this this looks like the remanence of a large blister. Cannot rule out infection. Culture in this area I did last week showed Staphylococcus lugdunensis few colonies. I am going to try to address this with his [employer] antibiotic that is done so well on the left having linezolid and this should cover the staph 2/1; the patient's wound on his left foot which was the original plantar foot wound thick skin and eschar around the edges even in the total contact cast but the wound surface does not look too bad The real problem is on how his right lateral foot at roughly the base of the fifth metatarsal. The wound is completely necrotic more worrisome than that there is swelling around the edges of this. We have been using silver alginate on both wounds and Keystone on the right foot. Unfortunately I think he is going to require systemic antibiotics while we await cultures. He did not get the x-ray done that we ordered last week [lost the prescription 2/7; disappointingly in the area on the left foot which we are treating with a total contact cast is still not closed although it is much smaller. He continues to have a lot of callus around the wound edge. -Right lateral foot culture I did last week was negative x-ray also negative for osteomyelitis. 2/15: TCC silver alginate on the left and silver alginate on the right lateral. No real improvement in either area 05/26/2021: T oday, the wounds are roughly the same size as at his previous visit, post-debridement. He continues to endorse fairly substantial drainage, particularly on the right. He has been in a total contact cast on the left. There is still  some callus surrounding this lesion. On the right, the periwound skin is quite macerated, along with surrounding callus. The center of the right-sided wound also has some dark, densely adherent material, which is very difficult to remove. 06/02/2021: Today, both wounds are slightly smaller. He has been using zinc oxide ointment around the right ulcer and the degree of maceration has improved markedly. There continues to be an area of nonviable tissue in the center of the right sided ulcer. The left-sided wound, which has been in the total contact cast. Appears clean and the degree of callus around it is less than previously. 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 He had to take the stairs and both wounds have increased in size. The left foot, which has been in a total contact cast, has developed a tunnel tracking to the lateral aspect of his foot. The nonviable tissue in  the center of the right-sided ulcer remains recalcitrant to debridement. There is significant undermining surrounding the entirety of the left sided wound. [name], Max ([id number]) [id number] Page 17 of 22 06/16/2021: The elevator at school has been fixed and the patient has been able to avoid putting as much weight on his wounds over the past week. We converted the left foot wound into a single lesion today, but despite this, the wound is actually smaller. The base is healthy with limited periwound callus. On the right, the central necrotic area is still present. He continues to be quite macerated around the right sided wound, despite applying barrier cream. This does, however, have the benefit of softening the callus to make it more easily removable. 06/23/2021: Today, the left wound is smaller. The lateral aspect that had opened up previously is now closed. The wound base has a healthy bed of granulation tissue and minimal slough.  Unfortunately, on the right, the wound is larger and continues to be fairly macerated. He has also reopened the wound at his right ankle. He thinks this is due to the gait he has adopted secondary to his total contact cast and boot. 06/30/2021: T oday, both wounds are a little bit larger. The lateral aspect on the left has remained closed. He continues to have significant periwound maceration. The culture that I took from the right sided wound grew a population of bacteria that is not covered by his current [facility] antibiotic. The center of the right- sided wound continues to appear necrotic with nonviable fat. It probes deeper today, but does not reach bone. 07/07/2021: The periwound maceration is a little bit less today. The right lateral foot wound has some areas that appear more viable and the necrotic center also looks a little bit better. The wound on the dorsal surface of his right foot near the ankle is contracting and the surface appears healthy. The left plantar wound surface looks healthy, but there is some new undermining on the medial portion. He did get his new Keystone antibiotic and began applying that to the right foot wound on Saturday. 07/14/2021: The intake nurse reported substantial drainage from his wounds, but the periwound skin actually looks better than is typical for him. The wound on the dorsal surface of his right foot near the ankle is smaller and just has a small open area underneath some dried eschar. The left plantar wound surface looks healthy and there has been no significant accumulation of callus. The right lateral foot wound looks quite a bit better, with the central portion, which typically appears necrotic, looking more viable albeit pale. 07/22/2021: His left foot is extremely macerated today. The wound is about the same size. The wound on the dorsal surface of his right foot near the ankle had closed, but he traumatized it removing the dressing and there is a tiny  skin tear in that location. The right lateral foot wound is bigger, but the surface appears healthy. 07/30/2021: The wound on the dorsal surface of his right foot near the ankle is closed. The right lateral foot wound again is a little bit bigger due to some undermining. The periwound skin is in better condition, however. He has been applying zinc oxide. The wound surface is a little bit dry today. On the left, he does not have the substantial maceration that we frequently see. The wound itself is smaller and has a clean surface. 08/06/2021: Both wounds seem to have deteriorated over the past week. The right lateral foot  wound has a dry surface but the periwound is boggy.. Overall wound dimensions are about the same. On the left, the wound is about the same size, but there is more undermining present underneath periwound callus. 08/13/2021: The right sided wound looks about the same, but on the left there has been substantial deterioration. The undermining continues to extend under periwound callus. Once this was removed, substantial extension of the wound was present. There is no odor or purulent drainage but clearly the wounds have broken down. 08/20/2021: The wounds look about the same today. He has been out of his total contact cast and has just been changing the dressings using topical Keystone with PolyMem Ag, Kerlix and Ace bandages. The wound on the top of his right ankle has reopened but this is quite small. There was a little bit of purulent material that I expressed when examining this wound. 08/24/2021: After the aggressive debridement I performed at his last visit, the wounds actually look a little bit better today. They are smaller with the exception of the wound on the top of his right ankle which is a little bit bigger as some more skin pulled off when he was changing his dressing. We are using topical Keystone with PolyMem Ag Kerlix and Ace bandages. 09/02/2021: There has been really no  change to any of his wounds. 09/16/2021: The patient was hospitalized last week with nausea, vomiting, and dehydration. He says that while he was in the hospital, his wounds were not really addressed properly. T oday, both plantar foot wounds are larger and the periwound skin is macerated. The wound on the dorsum of his right foot has a scab on the top. The right foot now has a crater where previously he had had nonviable fat. It looks as though this simply died and fell out. The periwound callus is wet. 09/24/2021: His wounds have deteriorated somewhat since his last visit. The wound on the dorsum of his right foot near his ankle is larger and has more nonviable tissue present. The crater in his right foot is even deeper; I cannot quite palpate or probe to bone but I am sure it is close. The wound on his left plantar foot has an odd boggy area in the center that almost feels as though it has fluid within it. He has run out of his topical Keystone antibiotic. We are using silver alginate on his wounds. 09/29/2021: He has developed a new wound on the dorsum of his left foot near his ankle. He says he thinks his wrapping is rubbing in that site. I would concur with this as the wound on his right ankle is larger. The left foot looks about the same. The right foot has the crater that was present last week. No significant slough accumulation, but his foot remains quite swollen and warm despite oral antibiotic therapy. 10/08/2021: All of his wounds look about the same as last week. He did not start his oral antibiotics that are prescribed until just a couple of days ago; his [employer] compounded antibiotics formula has been changed and he is awaiting delivery of the new recipe. His MRI that was scheduled for earlier this week was canceled as no prior authorization had been obtained; unfortunately the tech responsible sent an email to my old [place] email, which I no longer use nor have access to. 10/18/2021:  The wounds on his bilateral dorsal feet near the ankles are both improved. They are smaller and have just some eschar and slough  buildup. The left plantar wound has a fair amount of undermining, but the surface is clean. There is some periwound callus accumulation. On the right plantar foot, there is nonviable fat leading to a deep tunnel that tracks towards his dorsal medial foot. There is periwound callus and slough accumulation, as well. His right foot and leg remain swollen as compared to the left. 10/25/2021: The wounds on his bilateral dorsal feet and at the ankles have broken down somewhat. They are little bit larger than last week. The left plantar wound continues to undermine laterally but the surface is clean. The right plantar foot wound shows some decreased depth in the tunnel tracking towards his dorsal medial foot. He has not yet had the Doppler study that I ordered; it sounds like there is some confusion about the scheduling of the procedure. In addition, the MRI was denied and I have taken steps to appeal the denial. 11/24/2021: Since our last visit, Mr. Novosel was admitted to the hospital where an MRI suggested osteomyelitis. He was taken the operating room by podiatry. Bone biopsies were negative for osteomyelitis. They debrided his wounds and applied myriad matrix. He saw them last week and they removed his staples. He is here today to continue his wound healing process. T oday, both of the dorsal foot/ankle wounds are substantially smaller. There is just a little eschar overlying the left sided wound and some eschar and slough on the right. The right plantar foot ulcer has the healthiest surface of granulation tissue that I have seen to date. A portion of the myriad matrix failed to take and was hanging loose. It appears that myriad morcells were placed into the tunnel closest to the dorsal portion of his foot. These have sloughed off. The left plantar foot ulcer is about the same  size, but has a much healthier surface than in the past. Both plantar ulcers have callus and slough accumulation. 12/07/2021: Left dorsal foot/ankle wound is closed. The right dorsal foot/ankle wound is nearly closed and just has a small open area with some eschar and slough. The right plantar foot wound has contracted quite a bit since our last visit. It has a healthy surface with just a little bit of slough accumulation and periwound callus buildup. The left plantar foot wound is about the same size but the surface appears healthy. There is a little slough and periwound callus on this side, as well. 12/15/2021: Both dorsal foot/ankle wounds are closed. The right plantar foot wound is substantially smaller than at our last visit. The tunneling that was present has nearly closed. There is just a little bit of slough buildup. The left plantar foot wound is also a little bit smaller today. The surface is the healthiest that I have ever seen it. Light slough and periwound callus accumulation on this side. [name], Max ([id number]) [id number] Page 18 of 22 12/22/2021: The dorsal ankle/foot wounds remain closed. The right plantar foot wound continues to contract. There is still a bit of depth at the lateral portion of the wound but the surface has a good granulated appearance. The wound on the left is about the same size, the central indented portion is still adherent. It also is clean with good granulation tissue present. The periwound skin and callus are a bit macerated, however. 12/29/2021: Both ankle wounds remain closed. The right plantar foot wound is less than half the size that it was last week. There is no depth any longer and the surface has just a little  bit of slough and periwound callus. On the left, the wound is not much smaller, but the surface is healthier with good granulation tissue. There is also a little slough and periwound callus buildup. 01/05/2022: The  right plantar foot wound continues to contract and is once again about half the size as it was last week. There is just a little bit of surface slough and periwound callus. On the left, the depth of the wound has decreased and the diameter has started to contract. It is also very clean with just a little slough and biofilm present. 01/12/2022: The right plantar foot wound is smaller again today. There is just some periwound callus and slough accumulation. On the left, there is a fair amount of undermining and the surface is a little boggy, but no other significant change. 01/19/2022: The right plantar wound continues to contract. There is minimal periwound callus accumulation and just a light layer of slough on the surface. On the left, the surface looks better, but there is fairly significant undermining near the 11:00 portion of the wound, aiming towards his great toe. The skin overlying this area is very healthy and has a good fat layer present. No malodor or purulent drainage. 01/26/2022: The right sided wound continues to contract and has just a light layer of slough on the surface. Minimal periwound callus. On the left, he still has substantial undermining and the tissue surface is not as robust as I would like to see. No malodor or purulent drainage, but he did say he had a lot of serous drainage over the weekend. Patient History Information obtained from Patient. Family History No family history of Cancer, Diabetes, Hereditary Spherocytosis, Hypertension, Kidney Disease, Lung Disease, Seizures, Stroke, Thyroid Problems, Tuberculosis. Social History Never smoker, Marital Status - Single, Alcohol Use - Rarely, Drug Use - No History, Caffeine Use - Never. Medical History Eyes Denies history of Cataracts, Glaucoma, Optic Neuritis Ear/Nose/Mouth/Throat Denies history of Chronic sinus problems/congestion, Middle ear problems Hematologic/Lymphatic Denies history of Anemia, Hemophilia, Human  Immunodeficiency Virus, Lymphedema, Sickle Cell Disease Respiratory Denies history of Aspiration, Asthma, Chronic Obstructive Pulmonary Disease (COPD), Pneumothorax, Sleep Apnea, Tuberculosis Cardiovascular Denies history of Angina, Arrhythmia, Congestive Heart Failure, Coronary Artery Disease, Deep Vein Thrombosis, Hypertension, Hypotension, Myocardial Infarction, Peripheral Arterial Disease, Peripheral Venous Disease, Phlebitis, Vasculitis Gastrointestinal Denies history of Cirrhosis , Colitis, Crohnoos, Hepatitis A, Hepatitis B, Hepatitis C Endocrine Patient has history of Type II Diabetes Denies history of Type I Diabetes Immunological Denies history of Lupus Erythematosus, Raynaudoos, Scleroderma Integumentary (Skin) Denies history of History of Burn Musculoskeletal Denies history of Gout, Rheumatoid Arthritis, Osteoarthritis, Osteomyelitis Neurologic Denies history of Dementia, Neuropathy, Quadriplegia, Paraplegia, Seizure Disorder Oncologic Denies history of Received Chemotherapy, Received Radiation Psychiatric Denies history of Anorexia/bulimia, Confinement Anxiety Hospitalization/Surgery History - 11/1-11/06/2018- sepsis foot infection. - 11/4-11/5 02 sats low respiratory distress. Objective Constitutional No acute distress.. Vitals Time Taken: 8:35 AM, Height: 77 in, Weight: 280 lbs, BMI: 33.2, Temperature: 98.3 F, Pulse: 89 bpm, Respiratory Rate: 20 breaths/min, Blood Pressure: 136/89 mmHg. Respiratory Normal work of breathing on room air.. General Notes: 01/26/2022: The right sided wound continues to contract and has just a light layer of slough on the surface. Minimal periwound callus. On the left, he still has substantial undermining and the tissue surface is not as robust as I would like to see. No malodor or purulent drainage, but he did say he had a Lostant, Max ([id number]) [id number] Page 19 of 22 lot of serous drainage over the  weekend Integumentary (Hair, Skin) Wound #3 status is Open. Original cause of wound was Trauma. The date acquired was: 10/02/2019. The wound has been in treatment 117 weeks. The wound is located on the [place]. The wound measures 3.4cm length x 3.2cm width x 1.2cm depth; 8.545cm^2 area and 10.254cm^3 volume. There is Fat Layer (Subcutaneous Tissue) exposed. There is no tunneling noted, however, there is undermining starting at 3:00 and ending at 8:00 with a maximum distance of 1.2cm. There is a medium amount of serosanguineous drainage noted. The wound margin is well defined and not attached to the wound base. There is large (67-100%) red, hyper - granulation within the wound bed. There is a small (1-33%) amount of necrotic tissue within the wound bed including Adherent Slough. The periwound skin appearance had no abnormalities noted for moisture. The periwound skin appearance had no abnormalities noted for color. The periwound skin appearance exhibited: Callus. Periwound temperature was noted as No Abnormality. Wound #8 status is Open. Original cause of wound was Blister. The date acquired was: 04/18/2021. The wound has been in treatment 40 weeks. The wound is located on the Right,Lateral Foot. The wound measures 0.5cm length x 0.9cm width x 0.1cm depth; 0.353cm^2 area and 0.035cm^3 volume. There is Fat Layer (Subcutaneous Tissue) exposed. There is no tunneling or undermining noted. There is a medium amount of serosanguineous drainage noted. The wound margin is well defined and not attached to the wound base. There is large (67-100%) red, pink granulation within the wound bed. There is a small (1-33%) amount of necrotic tissue within the wound bed including Adherent Slough. The periwound skin appearance had no abnormalities noted for moisture. The periwound skin appearance had no abnormalities noted for color. The periwound skin appearance exhibited: Callus. Periwound temperature was  noted as No Abnormality. 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 There was a Selective/Open Wound Skin/Epidermis Debridement with a total area of 10.88 sq cm performed by [name], MD. With the following instrument(s): Curette to remove Non-Viable tissue/material. Material removed includes Callus, Slough, and Skin: Epidermis after achieving pain control using Lidocaine 4% Topical Solution. No specimens were taken. A time out was conducted at 09:10, prior to the start of the procedure. A Minimum amount of bleeding was controlled with Pressure. The procedure was tolerated well with a pain level of 0 throughout and a pain level of 0 following the procedure. Post Debridement Measurements: 3.4cm length x 3.2cm width x 0.7cm depth; 5.982cm^3 volume. Character of Wound/Ulcer Post Debridement is improved. Severity of Tissue Post Debridement is: Fat layer exposed. Post procedure Diagnosis Wound #3: Same as Pre-Procedure General Notes: scribed by Baruch Gouty, RN for Dr. [name]. Wound #8 Pre-procedure diagnosis of Wound #8 is a Diabetic Wound/Ulcer of the Lower Extremity located on the Right,Lateral Foot .Severity of Tissue Pre Debridement is: Fat layer exposed. There was a Selective/Open Wound Non-Viable Tissue Debridement with a total area of 1 sq cm performed by [name], MD. With the following instrument(s): Curette to remove Non-Viable tissue/material. Material removed includes Callus and Slough and after achieving pain control using Lidocaine 4% T opical Solution. No specimens  were taken. A time  out was conducted at 09:10, prior to the start of the procedure. A Minimum amount of bleeding was controlled with Pressure. The procedure was tolerated well with a pain level of 0 throughout and a pain level of 0 following the procedure. Post Debridement Measurements: 0.5cm length x 0.9cm width x 0.1cm depth; 0.035cm^3 volume. Character of Wound/Ulcer Post Debridement is improved. Severity of Tissue Post Debridement is: Fat layer exposed. Post procedure Diagnosis Wound #8: Same as Pre-Procedure General Notes: scribed by Baruch Gouty, RN for Dr. [name]. Plan Follow-up Appointments: Return Appointment in 1 week. - Dr. [name] - Room 1 with Delice Lesch. 11/1 @ 08:15 am Bathing/ Shower/ Hygiene: May shower and wash wound with soap and water. Edema Control - Lymphedema / SCD / Other: Elevate legs to the level of the heart or above for 30 minutes daily and/or when sitting, a frequency of: - throughout the day Avoid standing for long periods of time. Exercise regularly Compression stocking or Garment 20-30 mm/Hg pressure to: - to both legs daily Off-Loading: Open toe surgical shoe to: - Both feet Additional Orders / Instructions: Follow Nutritious Diet WOUND #3: - Foot Wound Laterality: Plantar, Left, Lateral Cleanser: Soap and Water 1 x Per Day/30 Days Discharge Instructions: May shower and wash wound with dial antibacterial soap and water prior to dressing change. Peri-Wound Care: Zinc Oxide Ointment 30g tube 1 x Per Day/30 Days Discharge Instructions: Apply Zinc Oxide to periwound with each dressing change Peri-Wound Care: Sween Lotion (Moisturizing lotion) 1 x Per Day/30 Days Discharge Instructions: Apply moisturizing lotion as directed Prim Dressing: Hydrofera Blue Ready Foam, 4x5 in (Generic) 1 x Per Day/30 Days ary Discharge Instructions: Apply to wound bed as instructed Secondary Dressing: ABD Pad, 8x10 (Generic) 1 x Per Day/30 Days [name], Max ([id number])  [id number] Page 20 of 22 Discharge Instructions: Apply over primary dressing as directed. Secondary Dressing: Optifoam Non-Adhesive Dressing, 4x4 in (Generic) 1 x Per Day/30 Days Discharge Instructions: Apply over primary dressing as directed. Secondary Dressing: Woven Gauze Sponge, Non-Sterile 4x4 in (Generic) 1 x Per Day/30 Days Discharge Instructions: Apply over primary dressing as directed. Secondary Dressing: Zetuvit Absorbent Pad, 4x4 (in/in) (Dispense As Written) 1 x Per Day/30 Days Secured With: 831M Medipore H Soft Cloth Surgical T ape, 4 x 10 (in/yd) (Dispense As Written) 1 x Per Day/30 Days Discharge Instructions: Secure with tape as directed. Com pression Wrap: Kerlix Roll 4.5x3.1 (in/yd) (Generic) 1 x Per Day/30 Days Discharge Instructions: Apply Kerlix and Coban compression as directed. Com pression Wrap: 831M ACE Elastic Bandage With VELCRO Brand Closure, 4 (in) (Generic) 1 x Per Day/30 Days WOUND #8: - Foot Wound Laterality: Right, Lateral Cleanser: Soap and Water 1 x Per Day/30 Days Discharge Instructions: May shower and wash wound with dial antibacterial soap and water prior to dressing change. Peri-Wound Care: Sween Lotion (Moisturizing lotion) 1 x Per Day/30 Days Discharge Instructions: Apply moisturizing lotion as directed Prim Dressing: Promogran Prisma Matrix, 4.34 (sq in) (silver collagen) (Generic) 1 x Per Day/30 Days ary Discharge Instructions: Moisten collagen with saline or hydrogel Secondary Dressing: ABD Pad, 8x10 (Generic) 1 x Per Day/30 Days Discharge Instructions: Apply over primary dressing as directed. Secondary Dressing: Optifoam Non-Adhesive Dressing, 4x4 in (Generic) 1 x Per Day/30 Days Discharge Instructions: Apply over primary dressing as directed. Secondary Dressing: Woven Gauze Sponge, Non-Sterile 4x4 in (Generic) 1 x Per Day/30 Days Discharge Instructions: Apply over primary dressing as directed. Secondary Dressing: Zetuvit  Absorbent Pad, 4x4 (in/in) (Dispense As Written) 1 x Per  Day/30 Days Secured With: 92M Medipore H Soft Cloth Surgical T ape, 4 x 10 (in/yd) (Dispense As Written) 1 x Per Day/30 Days Discharge Instructions: Secure with tape as directed. Com pression Wrap: Kerlix Roll 4.5x3.1 (in/yd) (Generic) 1 x Per Day/30 Days Discharge Instructions: Apply Kerlix and Coban compression as directed. Com pression Wrap: 92M ACE Elastic Bandage With VELCRO Brand Closure, 4 (in) (Generic) 1 x Per Day/30 Days 01/26/2022: The right sided wound continues to contract and has just a light layer of slough on the surface. Minimal periwound callus. On the left, he still has substantial undermining and the tissue surface is not as robust as I would like to see. No malodor or purulent drainage, but he did say he had a lot of serous drainage over the weekend. I used a curette to debride slough and callus from the right sided wound and skin and callus from the left. I think he might benefit from a wound VAC on the left so we will check his insurance for this. In the meantime, we will continue using Hydrofera Blue on the left and Prisma silver collagen on the right. Follow-up in 1 week. Electronic Signature(s) Signed: 01/26/2022 9:48:02 AM By: [name] MD FACS Previous Signature: 01/26/2022 9:36:42 AM Version By: [name] MD FACS Entered By: [name] on 01/26/2022 09:48:02 -------------------------------------------------------------------------------- HxROS Details Patient Name: Date of Service: [name], [name] 01/26/2022 8:15 A M Medical Record Number: [id number] Patient Account Number: [id number] Date of Birth/Sex: Treating RN: Jan 13, 1986 (36 y.o. M) Primary Care Provider: [name] Other Clinician: Referring Provider: Treating Provider/Extender: [name] in Treatment: 117 Information Obtained From Patient Eyes Medical History: Negative for: Cataracts;  Glaucoma; Optic Neuritis Ear/Nose/Mouth/Throat Medical History: Negative for: Chronic sinus problems/congestion; Middle ear problems Hematologic/Lymphatic Medical History: Negative for: Anemia; Hemophilia; Human Immunodeficiency Virus; Lymphedema; Sickle Cell Disease Vassell, Max ([id number]) [id number] Page 21 of 22 Respiratory Medical History: Negative for: Aspiration; Asthma; Chronic Obstructive Pulmonary Disease (COPD); Pneumothorax; Sleep Apnea; Tuberculosis Cardiovascular Medical History: Negative for: Angina; Arrhythmia; Congestive Heart Failure; Coronary Artery Disease; Deep Vein Thrombosis; Hypertension; Hypotension; Myocardial Infarction; Peripheral Arterial Disease; Peripheral Venous Disease; Phlebitis; Vasculitis Gastrointestinal Medical History: Negative for: Cirrhosis ; Colitis; Crohns; Hepatitis A; Hepatitis B; Hepatitis C Endocrine Medical History: Positive for: Type II Diabetes Negative for: Type I Diabetes Time with diabetes: 8 Treated with: Insulin Blood sugar tested every day: No Immunological Medical History: Negative for: Lupus Erythematosus; Raynauds; Scleroderma Integumentary (Skin) Medical History: Negative for: History of Burn Musculoskeletal Medical History: Negative for: Gout; Rheumatoid Arthritis; Osteoarthritis; Osteomyelitis Neurologic Medical History: Negative for: Dementia; Neuropathy; Quadriplegia; Paraplegia; Seizure Disorder Oncologic Medical History: Negative for: Received Chemotherapy; Received Radiation Psychiatric Medical History: Negative for: Anorexia/bulimia; Confinement Anxiety Immunizations Pneumococcal Vaccine: Received Pneumococcal Vaccination: No Implantable Devices None Hospitalization / Surgery History Type of Hospitalization/Surgery 11/1-11/06/2018- sepsis foot infection 11/4-11/5 02 sats low respiratory distress Family and Social History Cancer: No; Diabetes: No; Hereditary  Spherocytosis: No; Hypertension: No; Kidney Disease: No; Lung Disease: No; Seizures: No; Stroke: No; Thyroid Problems: No; Tuberculosis: No; Never smoker; Marital Status - Single; Alcohol Use: Rarely; Drug Use: No History; Caffeine Use: Never; Financial Concerns: No; Food, Clothing or Shelter Needs: No; Support System Lacking: No; Transportation Concerns: No Electronic Signature(s) Signed: 01/26/2022 12:04:38 PM By: [name] MD [place], Max ([id number]) PM By: [name] MD FACS [phone].pdf Page 22 of 22 Signed: 01/26/2022 12:04:38 Entered By: [name] on 01/26/2022 09:32:46 -------------------------------------------------------------------------------- SuperBill Details Patient Name: Date of Service:  [name], [name] 01/26/2022 Medical Record Number: [id number] Patient Account Number: [id number] Date of Birth/Sex: Treating RN: 09-Oct-1985 (36 y.o. M) Primary Care Provider: [name] Other Clinician: Referring Provider: Treating Provider/Extender: [name] in Treatment: 117 Diagnosis Coding ICD-10 Codes Code Description E11.621 Type 2 diabetes mellitus with foot ulcer L97.528 Non-pressure chronic ulcer of other part of left foot with other specified severity L97.518 Non-pressure chronic ulcer of other part of right foot with other specified severity Facility Procedures : 7 CPT4 Code: 3016010 Description: 93235 - DEBRIDE WOUND 1ST 20 SQ CM OR < ICD-10 Diagnosis Description L97.528 Non-pressure chronic ulcer of other part of left foot with other specified severi L97.518 Non-pressure chronic ulcer of other part of right foot with other  specified sever Modifier: ty ity Quantity: 1 Physician Procedures : CPT4 Code Description Modifier [id number] - WC PHYS LEVEL 3 - EST PT 25 ICD-10 Diagnosis Description L97.528 Non-pressure chronic ulcer of other part of left foot with other specified severity  L97.518 Non-pressure chronic ulcer of other part of  right foot with other specified severity E11.621 Type 2 diabetes mellitus with foot ulcer Quantity: 1 : [id number] - WC PHYS DEBR WO ANESTH 20 SQ CM ICD-10 Diagnosis Description L97.528 Non-pressure chronic ulcer of other part of left foot with other specified severity L97.518 Non-pressure chronic ulcer of other part of right foot with other specified  severity Quantity: 1 Electronic Signature(s) Signed: 01/26/2022 9:48:19 AM By: [name] MD FACS Entered By: [name] on 01/26/2022 09:48:18

## 2022-01-28 ENCOUNTER — Ambulatory Visit (INDEPENDENT_AMBULATORY_CARE_PROVIDER_SITE_OTHER): Payer: BC Managed Care – PPO | Admitting: Podiatry

## 2022-01-28 ENCOUNTER — Encounter: Payer: Self-pay | Admitting: Podiatry

## 2022-01-28 DIAGNOSIS — E1149 Type 2 diabetes mellitus with other diabetic neurological complication: Secondary | ICD-10-CM | POA: Diagnosis not present

## 2022-01-28 DIAGNOSIS — M79674 Pain in right toe(s): Secondary | ICD-10-CM

## 2022-01-28 DIAGNOSIS — M79675 Pain in left toe(s): Secondary | ICD-10-CM

## 2022-01-28 DIAGNOSIS — B351 Tinea unguium: Secondary | ICD-10-CM | POA: Diagnosis not present

## 2022-02-01 NOTE — Progress Notes (Signed)
Subjective: Chief Complaint  Patient presents with   Foot Ulcer    Follow up ulcer plantar left    "Its looking better. Wound care has me using a medicated dressing on it"   Debridement    Trim toenails     36 year old male presents the office today for diabetic foot exam, thick, elongated toes that he cannot trim himself.  H he is still being treated by the wound care center for the ulcers on bilateral feet.  Objective: AAO x3, NAD Noted to be hypertrophic, dystrophic with yellow, brown discoloration with central debris digits 1-4 bilaterally. Full-thickness ulcerations noted bilateral fifth metatarsal bases with the left side >> right.  Right leg is almost healed.  Granular base on the left side any drainage or pus. No pain with calf compression, swelling, warmth, erythema  Assessment: Symptomatic onychomycosis  Plan: -All treatment options discussed with the patient including all alternatives, risks, complications.  -Sharply debride the nails x8 without any complications or bleeding on the right foot.   -Will defer to the wound care center for further treatment of the wounds.  -Patient encouraged to call the office with any questions, concerns, change in symptoms.   Trula Slade DPM

## 2022-02-02 ENCOUNTER — Encounter (HOSPITAL_BASED_OUTPATIENT_CLINIC_OR_DEPARTMENT_OTHER): Payer: BC Managed Care – PPO | Attending: General Surgery | Admitting: General Surgery

## 2022-02-02 DIAGNOSIS — E11621 Type 2 diabetes mellitus with foot ulcer: Secondary | ICD-10-CM | POA: Insufficient documentation

## 2022-02-02 DIAGNOSIS — L97518 Non-pressure chronic ulcer of other part of right foot with other specified severity: Secondary | ICD-10-CM | POA: Diagnosis not present

## 2022-02-02 DIAGNOSIS — L84 Corns and callosities: Secondary | ICD-10-CM | POA: Insufficient documentation

## 2022-02-02 DIAGNOSIS — L97528 Non-pressure chronic ulcer of other part of left foot with other specified severity: Secondary | ICD-10-CM | POA: Insufficient documentation

## 2022-02-07 NOTE — Progress Notes (Signed)
[place], [name] ([id number]) [id number] Page 1 of 9 Visit Report for 02/02/2022 Arrival Information Details Patient Name: Date of Service: [name] D 02/02/2022 8:15 A M Medical Record Number: [id number] Patient Account Number: [id number] Date of Birth/Sex: Treating RN: [birth year] (36 y.o. [name] Primary Care Emanuelle Bastos: [name] Other Clinician: Referring Kamaury Cutbirth: Treating Demonica Farrey/Extender: [name] in Treatment: 41 Visit Information History Since Last Visit Added or deleted any medications: No Patient Arrived: Ambulatory Any new allergies or adverse reactions: No Arrival Time: 08:21 Had a fall or experienced change in No Accompanied By: self activities of daily living that may affect Transfer Assistance: None risk of falls: Patient Requires Transmission-Based Precautions: No Signs or symptoms of abuse/neglect since last visito No Patient Has Alerts: No Hospitalized since last visit: No Implantable device outside of the clinic excluding No cellular tissue based products placed in the center since last visit: Has Dressing in Place as Prescribed: Yes Has Footwear/Offloading in Place as Prescribed: Yes Pain Present Now: No Electronic Signature(s) Signed: 02/07/2022 4:40:13 PM By: Blanche East RN Entered By: Blanche East on 02/02/2022 08:22:48 -------------------------------------------------------------------------------- Encounter Discharge Information Details Patient Name: Date of Service: [name], [name] 02/02/2022 8:15 A M Medical Record Number: [id number] Patient Account Number: [id number] Date of Birth/Sex: Treating RN: [birth year] (36 y.o. [name] Primary Care Abdiaziz Klahn: [name] Other Clinician: Referring Sadler Teschner: Treating Calena Salem/Extender: [name] in Treatment: [phone] Encounter Discharge Information Items Post Procedure Vitals Discharge  Condition: Stable Temperature (F): 98.1 Ambulatory Status: Ambulatory Pulse (bpm): 87 Discharge Destination: Home Respiratory Rate (breaths/min): 18 Transportation: Private Auto Blood Pressure (mmHg): 141/93 Accompanied By: self Schedule Follow-up Appointment: Yes Clinical Summary of Care: Electronic Signature(s) Signed: 02/07/2022 4:40:13 PM By: Blanche East RN Entered By: Blanche East on 02/02/2022 09:52:05 [employer], [name] ([id number]) [id number] Page 2 of 9 -------------------------------------------------------------------------------- Lower Extremity Assessment Details Patient Name: Date of Service: [name] D 02/02/2022 8:15 A M Medical Record Number: [id number] Patient Account Number: [id number] Date of Birth/Sex: Treating RN: August 02, 1985 (36 y.o. [name] Primary Care Jakyla Reza: [name] Other Clinician: Referring Aleyda Gindlesperger: Treating Lena Gores/Extender: [name] in Treatment: 118 Edema Assessment Assessed: [name]: No] [name]: No] Edema: [Left: Yes] [Right: Yes] Calf Left: Right: Point of Measurement: 48 cm From Medial Instep 47 cm 47.5 cm Ankle Left: Right: Point of Measurement: 11 cm From Medial Instep 26 cm 28.5 cm Vascular Assessment Pulses: Dorsalis Pedis Palpable: [Left:Yes] [Right:Yes] Electronic Signature(s) Signed: 02/07/2022 4:40:13 PM By: Blanche East RN Entered By: Blanche East on 02/02/2022 08:27:03 -------------------------------------------------------------------------------- Multi Wound Chart Details Patient Name: Date of Service: [name], [name] 02/02/2022 8:15 A M Medical Record Number: [id number] Patient Account Number: [id number] Date of Birth/Sex: Treating RN: 03-Aug-1985 (36 y.o. M) Primary Care Mckennon Zwart: [name] Other Clinician: Referring Melissa Pulido: Treating Jovi Alvizo/Extender: [name] in Treatment: 118 Vital Signs Height(in):  77 Capillary Blood Glucose(mg/dl): 91 Weight(lbs): 280 Pulse(bpm): 87 Body Mass Index(BMI): 33.2 Blood Pressure(mmHg): 141/93 Temperature(F): 98.1 Respiratory Rate(breaths/min): 18 [3:Photos:] [N/A:N/A [id number] Page 3 of 9] Left, Lateral, Plantar Foot Right, Lateral Foot N/A Wound Location: Trauma Blister N/A Wounding Event: Diabetic Wound/Ulcer of the Lower Diabetic Wound/Ulcer of the Lower N/A Primary Etiology: Extremity Extremity Type II Diabetes Type II Diabetes N/A Comorbid History: 10/02/2019 04/18/2021 N/A Date Acquired: 118 41 N/A Weeks of Treatment: Open Open N/A Wound Status: No No N/A Wound Recurrence: 3.2x3.9x0.5 0.5x0.6x0.1 N/A Measurements L x W x D (  cm) 9.802 0.236 N/A A (cm) : rea 4.901 0.024 N/A Volume (cm) : -494.40% 57.10% N/A % Reduction in A rea: -2870.30% 78.20% N/A % Reduction in Volume: 3 Position 1 (o'clock): 1.6 Maximum Distance 1 (cm): 3 Starting Position 1 (o'clock): 9 Ending Position 1 (o'clock): 1.5 Maximum Distance 1 (cm): Yes No N/A Tunneling: Yes No N/A Undermining: Grade 2 Grade 2 N/A Classification: Medium Medium N/A Exudate A mount: Serosanguineous Serosanguineous N/A Exudate Type: red, brown red, brown N/A Exudate Color: Well defined, not attached Well defined, not attached N/A Wound Margin: Large (67-100%) Large (67-100%) N/A Granulation A mount: Red, Hyper-granulation Red, Pink N/A Granulation Quality: Small (1-33%) Small (1-33%) N/A Necrotic A mount: Fat Layer (Subcutaneous Tissue): Yes Fat Layer (Subcutaneous Tissue): Yes N/A Exposed Structures: Fascia: No Fascia: No Tendon: No Tendon: No Muscle: No Muscle: No Joint: No Joint: No Bone: No Bone: No Small (1-33%) Small (1-33%) N/A Epithelialization: Debridement - Selective/Open Wound Debridement - Selective/Open Wound N/A Debridement: Pre-procedure Verification/Time Out 08:43 08:43 N/A Taken: Callus, Slough  Callus, Slough N/A Tissue Debrided: Skin/Epidermis Skin/Epidermis N/A Level: 12.48 0.3 N/A Debridement A (sq cm): rea Curette Curette N/A Instrument: Minimum Minimum N/A Bleeding: Pressure Pressure N/A Hemostasis A chieved: Procedure was tolerated well Procedure was tolerated well N/A Debridement Treatment Response: 3.2x3.9x0.5 0.5x0.6x0.1 N/A Post Debridement Measurements L x W x D (cm) 4.901 0.024 N/A Post Debridement Volume: (cm) Callus: Yes Callus: Yes N/A Periwound Skin Texture: No Abnormalities Noted No Abnormalities Noted N/A Periwound Skin Moisture: No Abnormalities Noted No Abnormalities Noted N/A Periwound Skin Color: No Abnormality No Abnormality N/A Temperature: Debridement Debridement N/A Procedures Performed: Treatment Notes Electronic Signature(s) Signed: 02/02/2022 8:49:05 AM By: [name] MD FACS Entered By: [name] on 02/02/2022 08:49:05 -------------------------------------------------------------------------------- Multi-Disciplinary Care Plan Details Patient Name: Date of Service: [name], [name] 02/02/2022 8:15 A M Medical Record Number: [id number] Patient Account Number: [id number] Date of Birth/Sex: Treating RN: 1985/04/06 (36 y.o. [name] Primary Care Ponciano Shealy: [name] Other Clinician: Referring Suesan Mohrmann: Treating Arabel Barcenas/Extender: [name] in Treatment: [place] reviewed with physician [place], [name] ([id number]) [id number] Page 4 of 9 Active Inactive Nutrition Nursing Diagnoses: Imbalanced nutrition Potential for alteratiion in Nutrition/Potential for imbalanced nutrition Goals: Patient/caregiver agrees to and verbalizes understanding of need to use nutritional supplements and/or vitamins as prescribed Date Initiated: 10/24/2019 Date Inactivated: 04/06/2020 Target Resolution Date: 04/03/2020 Goal Status:  Met Patient/caregiver will maintain therapeutic glucose control Date Initiated: 10/24/2019 Target Resolution Date: 02/16/2022 Goal Status: Active Interventions: Assess HgA1c results as ordered upon admission and as needed Assess patient nutrition upon admission and as needed per policy Provide education on elevated blood sugars and impact on wound healing Provide education on nutrition Treatment Activities: Education provided on Nutrition : 11/24/2021 Notes: 11/17/20: Glucose control ongoing issue, target date extended. 01/26/21: Glucose management continues. Wound/Skin Impairment Nursing Diagnoses: Impaired tissue integrity Knowledge deficit related to ulceration/compromised skin integrity Goals: Patient/caregiver will verbalize understanding of skin care regimen Date Initiated: 10/24/2019 Target Resolution Date: 02/16/2022 Goal Status: Active Ulcer/skin breakdown will have a volume reduction of 30% by week 4 Date Initiated: 10/24/2019 Date Inactivated: 01/16/2020 Target Resolution Date: 01/10/2020 Unmet Reason: no change in Goal Status: Unmet measurements. Interventions: Assess patient/caregiver ability to obtain necessary supplies Assess patient/caregiver ability to perform ulcer/skin care regimen upon admission and as needed Assess ulceration(s) every visit Provide education on ulcer and skin care Notes: 11/17/20: Wound care regimen continues Electronic Signature(s) Signed: 02/07/2022 4:40:13 PM By: Blanche East  RN Entered By: Blanche East on 02/02/2022 08:39:43 -------------------------------------------------------------------------------- Pain Assessment Details Patient Name: Date of Service: [name] D 02/02/2022 8:15 A M Medical Record Number: [id number] Patient Account Number: [id number] Date of Birth/Sex: Treating RN: 02-05-1986 (36 y.o. [name] Primary Care Jossalyn Forgione: [name] Other Clinician: Referring Jamella Grayer: Treating Nixxon Faria/Extender:  [name] in Treatment: [place], [name] ([id number]) [id number] Page 5 of 9 Location of Pain Severity and Description of Pain Patient Has Paino No Site Locations Pain Management and Medication Current Pain Management: Electronic Signature(s) Signed: 02/07/2022 4:40:13 PM By: Blanche East RN Entered By: Blanche East on 02/02/2022 08:24:37 -------------------------------------------------------------------------------- Patient/Caregiver Education Details Patient Name: Date of Service: [name], [name] 11/1/2023andnbsp8:15 A M Medical Record Number: [id number] Patient Account Number: [id number] Date of Birth/Gender: Treating RN: January 13, 1986 (36 y.o. [name] Primary Care Physician: [name] Other Clinician: Referring Physician: Treating Physician/Extender: [name] in Treatment: 118 Education Assessment Education Provided To: Patient Education Topics Provided Elevated Blood Sugar/ Impact on Healing: Methods: Explain/Verbal Responses: Reinforcements needed, State content correctly Nutrition: Methods: Explain/Verbal Responses: Reinforcements needed, State content correctly Wound/Skin Impairment: Methods: Explain/Verbal Responses: Reinforcements needed, State content correctly Electronic Signature(s) Signed: 02/07/2022 4:40:13 PM By: Blanche East RN Entered By: Blanche East on 02/02/2022 08:40:09 [place], [name] ([id number]) [id number] Page 6 of 9 -------------------------------------------------------------------------------- Wound Assessment Details Patient Name: Date of Service: [name] D 02/02/2022 8:15 A M Medical Record Number: [id number] Patient Account Number: [id number] Date of Birth/Sex: Treating RN: [birth year] (36 y.o. [name] Primary Care Chason Mciver: [name] Other Clinician: Referring  Shiya Fogelman: Treating Reeya Bound/Extender: [name] in Treatment: 118 Wound Status Wound Number: 3 Primary Etiology: Diabetic Wound/Ulcer of the Lower Extremity Wound Location: Left, Lateral, Plantar Foot Wound Status: Open Wounding Event: Trauma Comorbid History: Type II Diabetes Date Acquired: 10/02/2019 Weeks Of Treatment: 118 Clustered Wound: No Photos Wound Measurements Length: (cm) 3.2 Width: (cm) 3.9 Depth: (cm) 0.5 Area: (cm) 9.802 Volume: (cm) 4.901 % Reduction in Area: -494.4% % Reduction in Volume: -2870.3% Epithelialization: Small (1-33%) Tunneling: Yes Position (o'clock): 3 Maximum Distance: (cm) 1.6 Undermining: Yes Starting Position (o'clock): 3 Ending Position (o'clock): 9 Maximum Distance: (cm) 1.5 Wound Description Classification: Grade 2 Wound Margin: Well defined, not attached Exudate Amount: Medium Exudate Type: Serosanguineous Exudate Color: red, brown Foul Odor After Cleansing: No Slough/Fibrino Yes Wound Bed Granulation Amount: Large (67-100%) Exposed Structure Granulation Quality: Red, Hyper-granulation Fascia Exposed: No Necrotic Amount: Small (1-33%) Fat Layer (Subcutaneous Tissue) Exposed: Yes Necrotic Quality: Adherent Slough Tendon Exposed: No Muscle Exposed: No Joint Exposed: No Bone Exposed: No Periwound Skin Texture Texture Color No Abnormalities Noted: No No Abnormalities Noted: Yes Callus: Yes Temperature / Pain Temperature: No Abnormality Moisture No Abnormalities Noted: Yes [place], [name] ([id number]) [id number] Page 7 of 9 Treatment Notes Wound #3 (Foot) Wound Laterality: Plantar, Left, Lateral Cleanser Soap and Water Discharge Instruction: May shower and wash wound with dial antibacterial soap and water prior to dressing change. Peri-Wound Care Zinc Oxide Ointment 30g tube Discharge Instruction: Apply Zinc Oxide to periwound with each dressing change Sween  Lotion (Moisturizing lotion) Discharge Instruction: Apply moisturizing lotion as directed Topical Primary Dressing Hydrofera Blue Ready Foam, 4x5 in Discharge Instruction: Apply to wound bed as instructed Secondary Dressing ABD Pad, 8x10 Discharge Instruction: Apply over primary dressing as directed. Optifoam Non-Adhesive Dressing, 4x4 in Discharge Instruction: Apply over primary dressing as directed. Woven Gauze Sponge, Non-Sterile 4x4 in Discharge  Instruction: Apply over primary dressing as directed. Zetuvit Absorbent Pad, 4x4 (in/in) Secured With 31M Medipore H Soft Cloth Surgical T ape, 4 x 10 (in/yd) Discharge Instruction: Secure with tape as directed. Compression Wrap Kerlix Roll 4.5x3.1 (in/yd) Discharge Instruction: Apply Kerlix and Coban compression as directed. 31M ACE Elastic Bandage With VELCRO Brand Closure, 4 (in) Compression Stockings Add-Ons Electronic Signature(s) Signed: 02/07/2022 4:40:13 PM By: Blanche East RN Entered By: Blanche East on 02/02/2022 08:38:30 -------------------------------------------------------------------------------- Wound Assessment Details Patient Name: Date of Service: [name] D 02/02/2022 8:15 A M Medical Record Number: [id number] Patient Account Number: [id number] Date of Birth/Sex: Treating RN: February 08, 1986 (36 y.o. [name] Primary Care Aricka Goldberger: [name] Other Clinician: Referring Hulen Mandler: Treating Francely Craw/Extender: [name] in Treatment: 118 Wound Status Wound Number: 8 Primary Etiology: Diabetic Wound/Ulcer of the Lower Extremity Wound Location: Right, Lateral Foot Wound Status: Open Wounding Event: Blister Comorbid History: Type II Diabetes Date Acquired: 04/18/2021 Weeks Of Treatment: 41 Clustered Wound: No Photos [place], [name] ([id number]) [phone].pdf Page 8 of 9 Wound Measurements Length: (cm) 0.5 Width: (cm) 0.6 Depth: (cm) 0.1 Area:  (cm) 0.236 Volume: (cm) 0.024 % Reduction in Area: 57.1% % Reduction in Volume: 78.2% Epithelialization: Small (1-33%) Tunneling: No Undermining: No Wound Description Classification: Grade 2 Wound Margin: Well defined, not attached Exudate Amount: Medium Exudate Type: Serosanguineous Exudate Color: red, brown Foul Odor After Cleansing: No Slough/Fibrino Yes Wound Bed Granulation Amount: Large (67-100%) Exposed Structure Granulation Quality: Red, Pink Fascia Exposed: No Necrotic Amount: Small (1-33%) Fat Layer (Subcutaneous Tissue) Exposed: Yes Necrotic Quality: Adherent Slough Tendon Exposed: No Muscle Exposed: No Joint Exposed: No Bone Exposed: No Periwound Skin Texture Texture Color No Abnormalities Noted: No No Abnormalities Noted: Yes Callus: Yes Temperature / Pain Temperature: No Abnormality Moisture No Abnormalities Noted: Yes Treatment Notes Wound #8 (Foot) Wound Laterality: Right, Lateral Cleanser Soap and Water Discharge Instruction: May shower and wash wound with dial antibacterial soap and water prior to dressing change. Peri-Wound Care Sween Lotion (Moisturizing lotion) Discharge Instruction: Apply moisturizing lotion as directed Topical Primary Dressing Promogran Prisma Matrix, 4.34 (sq in) (silver collagen) Discharge Instruction: Moisten collagen with saline or hydrogel Secondary Dressing ABD Pad, 8x10 Discharge Instruction: Apply over primary dressing as directed. Optifoam Non-Adhesive Dressing, 4x4 in Discharge Instruction: Apply over primary dressing as directed. Woven Gauze Sponge, Non-Sterile 4x4 in Discharge Instruction: Apply over primary dressing as directed. Zetuvit Absorbent Pad, 4x4 (in/in) Secured With 31M Medipore H Soft Cloth Surgical T ape, 4 x 10 (in/yd) Discharge Instruction: Secure with tape as directed. [place], [name] ([id number]) [id number] Page 9 of 9 Compression Wrap Kerlix Roll 4.5x3.1  (in/yd) Discharge Instruction: Apply Kerlix and Coban compression as directed. 31M ACE Elastic Bandage With VELCRO Brand Closure, 4 (in) Compression Stockings Add-Ons Electronic Signature(s) Signed: 02/07/2022 4:40:13 PM By: Blanche East RN Entered By: Blanche East on 02/02/2022 08:38:57 -------------------------------------------------------------------------------- Vitals Details Patient Name: Date of Service: [name], [name] 02/02/2022 8:15 A M Medical Record Number: [id number] Patient Account Number: [id number] Date of Birth/Sex: Treating RN: 09/24/1985 (36 y.o. [name] Primary Care Vonnie Spagnolo: [name] Other Clinician: Referring Mahari Vankirk: Treating Idonia Zollinger/Extender: [name] in Treatment: 118 Vital Signs Time Taken: 08:23 Temperature (F): 98.1 Height (in): 77 Pulse (bpm): 87 Weight (lbs): 280 Respiratory Rate (breaths/min): 18 Body Mass Index (BMI): 33.2 Blood Pressure (mmHg): 141/93 Capillary Blood Glucose (mg/dl): 91 Reference Range: 80 - 120 mg / dl Electronic Signature(s) Signed: 02/07/2022 4:40:13 PM By: Willey Blade,  [name] RN Entered By: Blanche East on 02/02/2022 08:24:20

## 2022-02-07 NOTE — Progress Notes (Signed)
[name], [name] ([id number]) [id number] Page 1 of 22 Visit Report for 02/02/2022 Chief Complaint Document Details Patient Name: Date of Service: [name] [name] 02/02/2022 8:15 A M Medical Record Number: [id number] Patient Account Number: [id number] Date of Birth/Sex: Treating RN: 04-17-1985 (36 y.o. M) Primary Care Provider: Seward [name] Other Clinician: Referring Provider: Treating Provider/Extender: [name] [name] in Treatment: [place] from: Patient Chief Complaint 01/11/2019; patient is here for review of a rather substantial wound over the left fifth plantar metatarsal head extending into the lateral part of his foot 10/24/2019; patient returns to clinic with wounds on his bilateral feet with underlying osteomyelitis biopsy-proven Electronic Signature(s) Signed: 02/02/2022 8:49:15 AM By: [name] Maudlin MD FACS Entered By: [name] [name] on 02/02/2022 08:49:14 -------------------------------------------------------------------------------- Debridement Details Patient Name: Date of Service: [name] [name], [name] 02/02/2022 8:15 A M Medical Record Number: [id number] Patient Account Number: [id number] Date of Birth/Sex: Treating RN: [birth year] (36 y.o. [name] [name] Primary Care Provider: Seward [name] Other Clinician: Referring Provider: Treating Provider/Extender: [name] [name] in Treatment: 118 Debridement Performed for Assessment: Wound #8 Right,Lateral Foot Performed By: Physician [name] Maudlin, MD Debridement Type: Debridement Severity of Tissue Pre Debridement: Fat layer exposed Level of Consciousness (Pre-procedure): Awake and Alert Pre-procedure Verification/Time Out Yes - 08:43 Taken: Start Time: 08:44 T Area Debrided (L x W): otal 0.5 (cm) x 0.6 (cm) = 0.3 (cm) Tissue and other material debrided: Non-Viable, Callus, Slough, Skin: Epidermis, Slough Level:  Skin/Epidermis Debridement Description: Selective/Open Wound Instrument: Curette Bleeding: Minimum Hemostasis Achieved: Pressure Response to Treatment: Procedure was tolerated well Level of Consciousness (Post- Awake and Alert procedure): Post Debridement Measurements of Total Wound Length: (cm) 0.5 Width: (cm) 0.6 Depth: (cm) 0.1 Volume: (cm) 0.024 Character of Wound/Ulcer Post Debridement: Requires Further Debridement Severity of Tissue Post Debridement: Fat layer exposed Post Procedure Diagnosis Same as Pre-procedure [name] [name] ([id number]) [id number] Page 2 of 22 Notes Scribed for Dr. Celine [name] by [name] East, RN Electronic Signature(s) Signed: 02/02/2022 9:00:16 AM By: [name] Maudlin MD FACS Signed: 02/07/2022 4:40:13 PM By: [name] East RN Entered By: [name] [name] on 02/02/2022 08:45:45 -------------------------------------------------------------------------------- Debridement Details Patient Name: Date of Service: [name] [name], [name] 02/02/2022 8:15 A M Medical Record Number: [id number] Patient Account Number: [id number] Date of Birth/Sex: Treating RN: [birth year] (36 y.o. [name] [name] Primary Care Provider: Seward [name] Other Clinician: Referring Provider: Treating Provider/Extender: [name] [name] in Treatment: 118 Debridement Performed for Assessment: Wound #3 Left,Lateral,Plantar Foot Performed By: Physician [name] Maudlin, MD Debridement Type: Debridement Severity of Tissue Pre Debridement: Fat layer exposed Level of Consciousness (Pre-procedure): Awake and Alert Pre-procedure Verification/Time Out Yes - 08:43 Taken: Start Time: 08:44 T Area Debrided (L x W): otal 3.2 (cm) x 3.9 (cm) = 12.48 (cm) Tissue and other material debrided: Non-Viable, Callus, Slough, Skin: Epidermis, Slough Level: Skin/Epidermis Debridement Description: Selective/Open Wound Instrument: Curette Bleeding:  Minimum Hemostasis Achieved: Pressure Response to Treatment: Procedure was tolerated well Level of Consciousness (Post- Awake and Alert procedure): Post Debridement Measurements of Total Wound Length: (cm) 3.2 Width: (cm) 3.9 Depth: (cm) 0.5 Volume: (cm) 4.901 Character of Wound/Ulcer Post Debridement: Requires Further Debridement Severity of Tissue Post Debridement: Fat layer exposed Post Procedure Diagnosis Same as Pre-procedure Notes Scribed for Dr. Celine [name] by [name] East, RN Electronic Signature(s) Signed: 02/02/2022 9:00:16 AM By: [name] Maudlin MD FACS Signed: 02/07/2022 4:40:13 PM By: [name] East RN Entered By: [name] [name] on 02/02/2022 08:47:32 -------------------------------------------------------------------------------- HPI Details Patient Name: Date of  Service: [name] [name] 02/02/2022 8:15 A M Medical Record Number: [id number] Patient Account Number: [id number] [name], [name] ([id number]) [id number] Page 3 of 22 Date of Birth/Sex: Treating RN: 03/16/1986 (36 y.o. 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 He comes in for review of a wound over the plantar fifth metatarsal head extending into the lateral part of the foot. He was followed for this previously by his podiatrist Dr. Cornelius [name]. As the patient tells his story he went to see podiatry first for a swelling he developed on the lateral part of his fifth metatarsal head in May. He states this was "open" by podiatry and the area closed. He was followed up in June and it was again opened callus removed and it closed promptly. There were plans being made for surgery on the fifth metatarsal head in June however his  blood sugar was apparently too high for anesthesia. Apparently the area was debrided and opened again in June and it is never closed since. Looking over the records from podiatry I am really not able to follow this. It was clear when he was first seen it was before 5/14 at that point he already had a wound. By 5/17 the ulcer was resolved. I do not see anything about a procedure. On 5/28 noted to have pre-ulcerative moderate keratosis. X-ray noted 1/5 contracted toe and tailor's bunion and metatarsal deformity. On a visit date on 09/28/2018 the dorsal part of the left foot it healed and resolved. There was concern about swelling in his lower extremity he was sent to the ER.. As far as I can tell he was seen in the ER on 7/12 with an ulcer on his left foot. A DVT rule out of the left leg was negative. I do not think I have complete records from podiatry but I am not able to verify the procedures this patient states he had. He states after the last procedure the wound has never closed although I am not able to follow this in the records I have from podiatry. He has not had a recent x-ray The patient has been using Neosporin on the wound. He is wearing a Darco shoe. He is still very active up on his foot working and exercising. Past medical history; type 2 diabetes ketosis-prone, leg swelling with a negative DVT study in July. Non-smoker ABI in our clinic was 0.85 on the left 10/16; substantial wound on the plantar left fifth met head extending laterally almost to the dorsal fifth MTP. We have been using silver alginate we gave him a Darco forefoot off loader. An x-ray did not show evidence of osteomyelitis did note soft tissue emphysema which I think was due to gas tracking through an open wound. There is no doubt in my mind he requires an MRI 10/23; MRI not booked until 3 November at the earliest this is largely due to his glucose sensor in the right arm. We have been using silver alginate. There  has been an improvement 10/29; I am still not exactly sure when his MRI is booked for. He says it is the third but it is the 10th in epic. This definitely needs to be done. He is running a low-grade fever today but no other symptoms. No real improvement in the 1 02/26/2019 patient  presents today for a follow-up visit here in our clinic he is last been seen in the clinic on October 29. Subsequently we were working on getting MRI to evaluate and see what exactly was going on and where we would need to go from the standpoint of whether or not he had osteomyelitis and again what treatments were going be required. Subsequently the patient ended up being admitted to the hospital on 02/07/2019 and was discharged on 02/14/2019. This is a somewhat interesting admission with a discharge diagnosis of pneumonia due to COVID-19 although he was positive for COVID-19 when tested at the urgent care but negative x2 when he was actually in the hospital. With that being said he did have acute respiratory failure with hypoxia and it was noted he also have a left foot ulceration with osteomyelitis. With that being said he did require oxygen for his pneumonia and I level 4 L. He was placed on antivirals and steroids for the COVID-19. He was also transferred to the [place] at one point. Nonetheless he did subsequently discharged home and since being home has done much better in that regard. The CT angiogram did not show any pulmonary embolism. With regard to the osteomyelitis the patient was placed on vancomycin and Zosyn while in the hospital but has been changed to Augmentin at discharge. It was also recommended that he follow- up with wound care and podiatry. Podiatry however wanted him to see [name] according to the patient prior to them doing anything further. His hemoglobin A1c was 9.9 as noted in the hospital. Have an MRI of the left foot performed while in the hospital on 02/04/2019. 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 There is an overlying plantar open wound noted an abscess tracking back along the lateral aspect of the fifth metatarsal shaft. There is otherwise diffuse cellulitis and mild fasciitis without findings of polymyositis. The patient did have recently pneumonia secondary to COVID-19 I looked in the chart through epic and it does appear that the patient may need to have an additional x-ray just to ensure everything is cleared and that he has no airspace disease prior to putting him into the [name]. 03/05/2019; patient was readmitted to the clinic last week. He was hospitalized twice for a viral upper respiratory tract infection from 11/1 through 11/4 and then 11/5 through 11/12 ultimately this turned out to be Covid pneumonitis. Although he was discharged on oxygen he is not using it. He says he feels fine. He has no exercise limitation no cough no sputum. His O2 sat in our clinic today was 100% on room air. 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 He received Vanco and Zosyn in the hospital and then was discharged on 2 weeks of Augmentin. I do not see any relevant cultures. He was supposed to follow-up with infectious disease but I do not see that he has an appointment. 12/8; patient saw Dr. Novella Olive of infectious disease last week. He felt that he had had adequate antibiotic therapy. He did not go to follow-up with Dr. Amalia Hailey of podiatry and I have again talked to him about the pros and cons of this. He does not want to consider a ray amputation of this time. He is aware of the risks of recurrence, migration etc. He started HBO  today and tolerated this well. He can complete the Augmentin that I gave him last week. I have looked over the lab work that Dr. Chana Bode ordered his  C-reactive protein was 3.3 and his sedimentation rate was 17. The C-reactive protein is never really been measurably that high in this patient 12/15; not much change in the wound today however he has undermining along the lateral part of the foot again more extensively than last week. He has some rims of epithelialization. We have been using silver alginate. He is undergoing hyperbarics but did not dive today 12/18; in for his obligatory first total contact cast change. Unfortunately there was pus coming from the undermining area around his fifth metatarsal head. This was cultured but will preclude reapplication of a cast. He is seen in conjunction with HBO 12/24; patient had staph lugdunensis in the wound in the undermining area laterally last time. We put him on doxycycline which should have covered this. The wound looks better today. I am going to give him another week of doxycycline before reattempting the total contact cast 12/31; the patient is completing antibiotics. Hemorrhagic debris in the distal part of the wound with some undermining distally. He also had hyper granulation. Extensive debridement with a #5 curette. The infected area that was on the lateral part of the fifth met head is closed over. I do not think he needs any more antibiotics. Patient was seen prior to HBO. Preparations for a total contact cast were made in the cast will be placed post hyperbarics 04/11/19; once again the patient arrives today without complaint. He had been in a cast all week noted that he had heavy drainage this week. This resulted in large raised areas of macerated tissue around the wound 1/14; wound bed looks better slightly smaller. Hydrofera Blue has been changing himself. He had a heavy drainage last week which caused a lot of maceration around the wound so I took him out of a total contact cast he says the drainage is actually better this week He is seen today in conjunction with HBO 1/21; returns to  clinic. He was up in [place] for a day or 2 attending a funeral. He comes back in with the wound larger and with a large area of exposed bone. He had osteomyelitis and septic arthritis of the fifth left metatarsal head while he was in hospital. He received IV antibiotics in the hospital for a prolonged period of time then 3 weeks of Augmentin. Subsequently I gave him 2 weeks of doxycycline for more superficial wound infection. When I saw this last week the wound was smaller the surface of the wound looks satisfactory. 1/28; patient missed hyperbarics today. Bone biopsy I did last time showed Enterococcus faecalis and Staphylococcus lugdunensis . He has a wide area of exposed bone. We are going to use silver alginate as of today. I had another ethical discussion with the patient. 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 In this situation I think the likelihood of healing this is low. Therefore I have recommended a ray amputation and with the patient's agreement I have referred him to Dr. Doran Durand. [name], [name] ([id number]) [id number] Page 4 of 22 The other issue is that his compliance with hyperbarics has been minimal because of his work schedule and given his underlying decision I am going to stop this today READMISSION 10/24/2019 MRI 09/29/2019 left foot IMPRESSION: 1. Apparent skin ulceration inferior and lateral to the 5th metatarsal base with underlying heterogeneous  T2 signal and enhancement in the subcutaneous fat. Small peripherally enhancing fluid collections along the plantar and lateral aspects of the 5th metatarsal base suspicious for abscesses. 2. Interval amputation through the mid 5th metatarsal with nonspecific low-level marrow edema and enhancement. Given the proximity to the adjacent soft tissue inflammatory changes, osteomyelitis cannot be excluded. 3. The additional bones appear unremarkable. MRI 09/29/2019  right foot IMPRESSION: 1. Soft tissue ulceration lateral to the 5th MTP joint. There is low-level T2 hyperintensity within the 4th and 5th metatarsal heads and adjacent proximal phalanges without abnormal T1 signal or cortical destruction. These findings are nonspecific and could be seen with early marrow edema, hyperemia or early osteomyelitis. No evidence of septic joint. 2. Mild tenosynovitis and synovial enhancement associated with the extensor digitorum tendons at the level of the midfoot. 3. Diffuse low-level muscular T2 hyperintensity and enhancement, most consistent with diabetic myopathy. LEFT FOOT BONE Methicillin resistant staphylococcus aureus Staphylococcus lugdunensis MIC MIC CIPROFLOXACIN >=8 RESISTANT Resistant <=0.5 SENSI... Sensitive CLINDAMYCIN <=0.25 SENS... Sensitive >=8 RESISTANT Resistant ERYTHROMYCIN >=8 RESISTANT Resistant >=8 RESISTANT Resistant GENTAMICIN <=0.5 SENSI... Sensitive <=0.5 SENSI... Sensitive Inducible Clindamycin NEGATIVE Sensitive NEGATIVE Sensitive OXACILLIN >=4 RESISTANT Resistant 2 SENSITIVE Sensitive RIFAMPIN <=0.5 SENSI... Sensitive <=0.5 SENSI... Sensitive TETRACYCLINE <=1 SENSITIVE Sensitive <=1 SENSITIVE Sensitive TRIMETH/SULFA <=10 SENSIT Sensitive <=10 SENSIT Sensitive ... [name] Kitchen.. VANCOMYCIN 1 SENSITIVE Sensitive <=0.5 SENSI... Sensitive Right foot bone . Component 3 wk ago Specimen Description BONE Special Requests RIGHT 4 METATARSAL SAMPLE B Gram Stain NO WBC SEEN NO ORGANISMS SEEN Culture RARE METHICILLIN RESISTANT STAPHYLOCOCCUS AUREUS NO ANAEROBES ISOLATED Performed at [facility], [place] [address], [place], [place] [address] Report Status 10/08/2019 FINAL Organism ID, Bacteria METHICILLIN RESISTANT STAPHYLOCOCCUS AUREUS Resulting Agency [facility] Susceptibility Methicillin resistant staphylococcus aureus MIC CIPROFLOXACIN >=8 RESISTANT Resistant CLINDAMYCIN <=0.25 SENS... Sensitive ERYTHROMYCIN >=8 RESISTANT  Resistant GENTAMICIN <=0.5 SENSI... Sensitive Inducible Clindamycin NEGATIVE Sensitive OXACILLIN >=4 RESISTANT Resistant RIFAMPIN <=0.5 SENSI... Sensitive TETRACYCLINE <=1 SENSITIVE Sensitive TRIMETH/SULFA <=10 SENSIT Sensitive ... VANCOMYCIN 1 SENSITIVE Sensitive This is a patient we had in clinic earlier this year with a wound over his left fifth metatarsal head. 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 Doran Durand on 05/16/2019. He seems to have developed problems with wounds on his bilateral feet in June 2021 although he may have had problems earlier than this. He was in an urgent care with a right foot ulcer on 09/26/2019 and given a course of doxycycline. This was apparently after having trouble getting into see orthopedics. He was seen by podiatry on 09/28/2019 noted to have bilateral lower extremity ulcers including the left lateral fifth metatarsal base and the right subfifth met head. It was noted that had purulent drainage at that time. He required hospitalization from 6/20 through 7/2. This was because of worsening right foot wounds. He underwent bilateral operative incision and drainage and bone biopsies bilaterally. Culture results are listed above. He has been referred back to clinic by Dr. Jacqualyn Posey of podiatry. He is also followed by Dr. Megan Salon who saw him yesterday. He was discharged from hospital on Zyvox Flagyl and Levaquin and yesterday changed to doxycycline Flagyl and Levaquin. His inflammatory markers on 6/26 showed a sedimentation rate of 129 and a C-reactive protein of 5.  This is improved to 14 and 1.3 respectively. This would indicate improvement. ABIs in our clinic today were 1.23 on the right  and 1.20 on the left [name], [name] ([id number]) [id number] Page 5 of 22 11/01/2019 on evaluation today patient appears to be doing fairly well in regard to the wounds on his feet at this point. Fortunately there is no signs of active infection at this time. No fevers, chills, nausea, vomiting, or diarrhea. He currently is seeing infectious disease and still under their care at this point. Subsequently he also has both wounds which she has not been using collagen on as he did not receive that in his packaging he did not call us and let us know that. Apparently that just was missed on the order. Nonetheless we will get that straightened out today. 8/9-Patient returns for bilateral foot wounds, using Prisma with hydrogel moistened dressings, and the wounds appear stable. Patient using surgical shoes, avoiding much pressure or weightbearing as much as possible 8/16; patient has bilateral foot wounds. 1 on the right lateral foot proximally the other is on the left mid lateral foot. Both required debridement of callus and thick skin around the wounds. We have been using silver collagen 8/27; patient has bilateral lateral foot wounds. The area on the left substantially surrounded by callus and dry skin. This was removed from the wound edge. The underlying wound is small. The area on the right measured somewhat smaller today. 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 Unfortunately I did not update his antibiotics during today's visit. 9/10 I reviewed Dr. Hale Bogus last notes he felt he had completed antibiotics his inflammatory markers were reasonably well controlled. He has a small wound on the lateral left foot and a tiny area on the right which is just above closed. He is using Hydrofera Blue with border foam he has bilateral surgical shoes 9/24; 2 week f/u. doing well. right foot is closed. left foot still  undermined. 10/14; right foot remains closed at the fifth met head. The area over the base of the left fifth metatarsal has a small open area but considerable undermining towards the plantar foot. Thick callus skin around this suggests an adequate pressure relief. We have talked about this. He says he is going to go back into his cam boot. I suggested a total contact cast he did not seem enamored with this suggestion 10/26; left foot base of the fifth metatarsal. Same condition as last time. He has skin over the area with an open wound however the skin is not adherent. He went to see Dr. Earleen Newport who did an x-ray and culture of his foot I have not reviewed the x-ray but the patient was not told anything. He is on doxycycline 11/11; since the patient was last here he was in the emergency room on 10/30 he was concerned about swelling in the left foot. They did not do any cultures or x-rays. They changed his antibiotics to cephalexin. Previous culture showed group B strep. The cephalexin is appropriate as doxycycline has less than predictable coverage. Arrives in clinic today with swelling over this area under the wound. He also has a new wound on the right fifth metatarsal head 11/18; the patient has a difficult wound on the lateral aspect of the left fifth metatarsal head. The wound was almost ballotable last week I opened it slightly expecting to see purulence however there was just bleeding. I cultured this this was  negative. X-ray unchanged. We are trying to get an MRI but I am not sure were going to be able to get this through his insurance. He also has an area on the right lateral fifth metatarsal head this looks healthier 12/3; the patient finally got our MRI. Surprisingly this did not show osteomyelitis. I did show the soft tissue ulceration at the lateral plantar aspect of the fifth metatarsal base with a tiny residual 6 mm abscess overlying the superficial fascia I have tried to culture this area  I have not been able to get this to grow anything. Nevertheless the protruding tissue looks aggravated. I suspect we should try to treat the underlying "abscess with broad-spectrum antibiotics. I am going to start him on Levaquin and Flagyl. He has much less edema in his legs and I am going to continue to wrap his legs and see him weekly 12/10. I started Levaquin and Flagyl on him last week. He just picked up the Flagyl apparently there was some delay. The worry is the wound on the left fifth metatarsal base which is substantial and worsening. His foot looks like he inverts at the ankle making this a weightbearing surface. Certainly no improvement in fact I think the measurements of this are somewhat worse. We have been using 12/17; he apparently just got the Levaquin yesterday this is 2 weeks after the fact. He has completed the Flagyl. The area over the left fifth metatarsal base still has protruding granulation tissue although it does not look quite as bad as it did some weeks ago. He has severe bilateral lymphedema although we have not been treating him for wounds on his legs this is definitely going to require compression. There was so much edema in the left I did not wish to put him in a total contact cast today. I am going to increase his compression from 3-4 layer. The area on the right lateral fifth met head actually look quite good and superficial. 12/23; patient arrived with callus on the right fifth met head and the substantial hyper granulated callused wound on the base of his fifth metatarsal. He says he is completing his Levaquin in 2 days but I do not think that adds up with what I gave him but I will have to double check this. We are using Hydrofera Blue on both areas. My plan is to put the left leg in a cast the week after New Year's 04/06/2020; patient's wounds about the same. Right lateral fifth metatarsal head and left lateral foot over the base of the fifth metatarsal. There is  undermining on the left lateral foot which I removed before application of total contact cast continuing with Hydrofera Blue new. Patient tells me he was seen by endocrinology today lab work was done [Dr. Kerr]. Also wondering whether he was referred to cardiology. I went over some lab work from previously does not have chronic renal failure certainly not nephrotic range proteinuria he does have very poorly controlled diabetes but this is not his most updated lab work. Hemoglobin A1c has been over 11 1/10; the patient had a considerable amount of leakage towards mid part of his left foot with macerated skin however the wound surface looks better the area on the right lateral fifth met head is better as well. I am going to change the dressing on the left foot under the total contact cast to silver alginate, continue with Hydrofera Blue on the right. 1/20; patient was in the total contact cast for  10 days. Considerable amount of drainage although the skin around the wound does not look too bad on the left foot. The area on the right fifth metatarsal head is closed. Our nursing staff reports large amount of drainage out of the left lateral foot wound 1/25; continues with copious amounts of drainage described by our intake staff. PCR culture I did last week showed E. coli and Enterococcus faecalis and low quantities. Multiple resistance genes documented including extended spectrum beta lactamase, MRSA, MRSE, quinolone, tetracycline. The wound is not quite as good this week as it was 5 days ago but about the same size 2/3; continues with copious amounts of malodorous drainage per our intake nurse. The PCR culture I did 2 weeks ago showed E. coli and low quantities of Enterococcus. There were multiple resistance genes detected. I put Neosporin on him last week although this does not seem to have helped. The wound is slightly deeper today. Offloading continues to be an issue here although with the amount of  drainage she has a total contact cast is just not going to work 2/10; moderate amount of drainage. Patient reports he cannot get his stocking on over the dressing. I told him we have to do that the nurse gave him suggestions on how to make this work. The wound is on the bottom and lateral part of his left foot. Is cultured predominantly grew low amounts of Enterococcus, E. coli and anaerobes. There were multiple resistance genes detected including extended spectrum beta lactamase, quinolone, tetracycline. I could not think of an easy oral combination to address this so for now I am going to do topical antibiotics provided by [facility] I think the main agents here are vancomycin and an aminoglycoside. We have to be able to give him access to the wounds to get the topical antibiotic on 2/17; moderate amount of drainage this is unchanged. He has his Keystone topical antibiotic against the deep tissue culture organisms. He has been using this and changing the dressing daily. Silver alginate on the wound surface. 2/24; using Keystone antibiotic with silver alginate on the top. He had too much drainage for a total contact cast at one point although I think that is improving and I think in the next week or 2 it might be possible to replace a total contact cast I did not do this today. In general the wound surface looks healthy however he continues to have thick rims of skin and subcutaneous tissue around the wide area of the circumference which I debrided 06/04/2020 upon evaluation today patient appears to be doing well in regard to his wound. I do feel like he is showing signs of improvement. There is little bit of callus and dead tissue around the edges of the wound as well as what appears to be a little bit of a sinus tract that is off to the side laterally I would perform debridement to clear that away today. 3/17; left lateral foot. The wound looks about the same as I remember. Not much depth surface looks  healthy. No evidence of infection 3/25; left lateral foot. Wound surface looks about the same. Separating epithelium from the circumference. There really is no evidence of infection here however not making progress by my view 3/29; left lateral foot. Surface of the wound again looks reasonably healthy still thick skin and subcutaneous tissue around the wound margins. There is no evidence of infection. One of the concerns being brought up by the nurses has again the amount of  drainage vis--vis continued use of a total contact cast 4/5; left lateral foot at roughly the base of the fifth metatarsal. Nice healthy looking granulated tissue with rims of epithelialization. The overall wound measurements are not any better but the tissue looks healthy. The only concern is the amount of drainage although he has no surrounding maceration with what we have been doing recently to absorb fluid and protect his skin. He also has lymphedema. 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 He states that the kerlix/coban under the TCC rolled down and could not get it [place], [name] ([id number]) [id number] Page 6 of 22 back up. He has been using an offloading scooter and has somehow hurt his right foot using this device. This happened last week. He states that the side of his right foot developed a blister and opened. The top of his foot also has a few small open wounds he thinks is due to his socks rubbing in his shoes. He has not been using any dressings to the wound. He denies purulent drainage, fever/chills or erythema to the wounds. 4/22; patient presents for 1 week follow-up. He developed new wounds to the right foot that were evaluated at last  clinic visit. He continues to have a total contact cast to the left leg and he reports no issues. He has been using silver collagen to the right foot wounds with no issues. He denies purulent drainage, fever/chills or erythema to the right foot wounds. He has no complaints today 4/25; patient presents for 1 week follow-up. He has a total contact cast of the left leg and reports no issues. He has been using silver alginate to the right foot wound. He denies purulent drainage, fever/chills or erythema to the right foot wounds. 5/2 patient presents for 1 week follow-up. T contact cast on the left. The wound which is on the base of the plantar foot at the base of the fifth metatarsal otal actually looks quite good and dimensions continue to gradually contract. HOWEVER the area on the right lateral fifth metatarsal head is much larger than what I remember from 2 weeks ago. Once more is he has significant levels of hypergranulation. Noteworthy that he had this same hyper granulated response on his wound on the left foot at one point in time. So much so that he I thought there was an underlying fluid collection. Based on this I think this just needs debridement. 5/9; the wound on the left actually continues to be gradually smaller with a healthy surface. Slight amount of drainage and maceration of the skin around but not too bad. However he has a large wound over the right fifth metatarsal head very much in the same configuration as his left foot wound was initially. I used silver nitrate to address the hyper granulated tissue no mechanical debridement 5/16; area on the left foot did not look as healthy this week deeper thick surrounding macerated skin and subcutaneous tissue. The area on the right foot fifth met head was about the same The area on the right ankle that we identified last week is completely broken down into an open wound presumably a stocking rubbing issue 5/23; patient has been using a  total contact cast to the left  side. He has been using silver alginate underneath. He has also been using silver alginate to the right foot wounds. He has no complaints today. He denies any signs of infection. 5/31; the left-sided wound looks some better measure smaller surface granulation looks better. We have been using silver alginate under the total contact cast The large area on his right fifth met head and right dorsal foot look about the same still using silver alginate 6/6; neither side is good as I was hoping although the surface area dimensions are better. A lot of maceration on his left and right foot around the wound edge. Area on the dorsal right foot looks better. He says he was traveling. I am not sure what does the amount of maceration around the plantar wounds may be drainage issues 6/13; in general the wound surfaces look quite good on both sides. Macerated skin and raised edges around the wound required debridement although in general especially on the left the surface area seems improved. The area on the right dorsal ankle is about the same I thought this would not be such a problem to close 6/20; not much change in either wound although the one on the right looks a little better. Both wounds have thick macerated edges to the skin requiring debridements. We have been using silver alginate. The area on the dorsal right ankle is still open I thought this would be closed. 6/28; patient comes in today with a marked deterioration in the right foot wound fifth met head. Wide area of exposed bone this is a drastic change from last time. The area on the left there we have been casting is stagnant. We have been using silver alginate in both wound areas. 7/5; bone culture I did for PCR last time was positive for Pseudomonas, group B strep, Enterococcus and Staph aureus. There was no suggestion of methicillin resistance or ampicillin resistant genes. This was resistant to tetracycline  however He comes into the clinic today with the area over his right plantar fifth metatarsal head which had been doing so well 2 weeks ago completely necrotic feeling bone. I do not know that this is going to be salvageable. The left foot wound is certainly no smaller but it has a better surface and is superficial. 7/8; patient called in this morning to say that his total contact cast was rubbing against his foot. He states he is doing fine overall. He denies signs of infection. 7/12; continued deterioration in the wound over the right fifth metatarsal head crumbling bone. This is not going to be salvageable. The patient agrees and wants to be referred to Dr. Doran Durand which we will attempt to arrange as soon as possible. I am going to continue him on antibiotics as long as that takes so I will renew those today. The area on the left foot which is the base of the fifth metatarsal continues to look somewhat better. Healthy looking tissue no depth no debridement is necessary here. 7/20; the patient was kindly seen by Dr. Doran Durand of orthopedics on 10/19/2020. 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 Towards this end we have taken him out of the total contact cast on the left we will put him in a wrap with Hydrofera Blue. As I understand things surgery is planned for 7/21 7/27; patient had his surgery last Thursday. 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  Apparently everything went well we did not still disturb that today The area on the left foot actually looks quite good. He has been much less mobile which probably explains this he did not seem to do well in the total contact cast secondary to drainage and maceration I think. 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 Fortunately there is no evidence of  active infection at this time. No fevers, chills, nausea, vomiting, or diarrhea. Overall I think that he is actually doing extremely well. Nonetheless I do believe that he is staying off of this more following the surgery in his right foot that is the reason the left is doing so great. 8/16; left plantar foot wound. This looks smaller than the last time I saw this he is using Hydrofera Blue. The surgical wound on the right foot is being followed by Dr. Doran Durand we did not look at this today. He has surgical shoes on both feet 8/23; left plantar foot wound not as good this week. Surrounding macerated skin and subcutaneous tissue everything looks moist and wet. I do not think he is offloading this adequately. He is using a surgical shoe Apparently the right foot surgical wound is not open although I did not check his foot 8/31; left plantar foot lateral aspect. Much improved this week. He has no maceration. Some improvement in the surface area of the wound but most impressively the depth is come in we are using silver alginate. The patient is a Product/process development scientist. He is asked that we write him a letter so he can go back to work. I have also tried to see if we can write something that will allow him to limit the amount of time that he is on his foot at work. 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 Hopefully the latter can be avoided 9/6; I believe the patient missed an appointment last week. He arrives in today with a wound looking roughly the same certainly no better. Undermining laterally and also inferiorly. We used molecuLight today in training with the patient's permission.. We are using silver alginate 9/21 wound is measuring bigger this week although this may have to do with the aggressive circumferential debridement last week in response to the blush fluorescence on the MolecuLight. Culture I did last week  showed significant MSSA and E. coli. I put him on Augmentin but he has not started it yet. We are also going to send this for compounded antibiotics at [facility]. There is no evidence of systemic infection 9/29; silver alginate. His Keystone arrived. He is completing Augmentin in 2 days. Offloading in a cam boot. Moderate drainage per our intake staff 10/5; using silver alginate. He has been using his [place]. He has completed his Augmentin. Per our intake nurse still a lot of drainage, far too much to consider a total contact cast. Wound measures about the same. He had the same undermining area that I defined last week from a roughly 11-3. I remove this today 10/12; using silver alginate he is using the [place]. He comes in for a nurse visit hence we are applying [employer] twice a week. Measuring slightly better today and less notable drainage. Extensive debridement of the wound edge last time 10/18; using topical Keystone and silver alginate and a soft cast. Wound  measurements about the same. Drainage was through his soft cast. We are changing this twice a week Tuesdays and Friday [place], [name] ([id number]) [id number] Page 7 of 22 10/25; comes in with moderate drainage. Still using Keystone silver alginate and a soft cast. Wound dimensions completely the same.He has a lot of edema in the left leg he has lymphedema. Asking for [name] to consider wrapping him as he cannot get his stocking on over the soft cast 11/2; comes in with moderate to large drainage slightly smaller in terms of width we have been using [place]. His wound looks satisfactory but not much improvement 11/4; patient presents today for obligatory cast change. Has no issues or complaints today. He denies signs of infection. 11/9; patient traveled this weekend to [place], was on the cast quite a bit. Staining of the cast with black material from his walking boot. Drainage was not quite as bad as we feared. Using  silver alginate and Keystone 11/16; we do not have size for cast therefore we have been putting a soft cast on him since the change on Friday. Still a significant amount of drainage necessitating changing twice a week. We have been using the Keystone at cast changes either hard or soft as well as silver alginate Comes in the clinic with things actually looking fairly good improvement in width. He says his offloading is about the same 02/24/2021 upon evaluation today patient actually comes back in and is doing excellent in regard to his foot ulcer this is significantly smaller even compared to the last visit. The soft cast seems to have done extremely well for him which is great news. I do not see any signs of infection minimal debridement will be needed today. 11/30; left lateral foot much improved half a centimeter improvement in surface area. No evidence of infection. He seems to be doing better with the soft cast in the TCC therefore we will continue with this. He comes back in later in the week for a change with the nurses. This is due to drainage 12/6; no improvement in dimensions. Under illumination some debris on the surface we have been using silver alginate, soft cast. If there is anything optimistic here he seems to have have less drainage 12/13. Dimensions are improved both length and width and slightly in depth. Appears to be quite healthy today. Raised edges of this thick skin and callus around the edges however. He is in a soft cast were bringing him back once for a change on Friday. Drainage is better 12/20. Dimensions are improved. He still has raised edges of thick skin and callus around the edges. We are using a soft cast 12/28; comes in today with thick callus around the wound. Using silver under alginate under a soft cast. I do not think there is much improvement in any measurement 2023 04/06/2021; patient was put in a total contact cast. Unfortunately not much change in surface  area 1/10; not much different still thick callus and skin around the edge in spite of the total contact cast. This was just debrided last week we have been using the [facility] compounded antibiotic and silver alginate under a total contact cast 1/18 the patient's wound on the left side is doing nicely. smaller HOWEVER he comes in today with a wound on the right foot laterally. blister most likely serosangquenous drainage 1/24; the patient continues to do well in terms of the plantar left foot which is continued to contract using silver alginate under the total contact cast  HOWEVER the right lateral foot is bigger with denuded skin around the edges. I used pickups and a #15 scalpel to remove this this looks like the remanence of a large blister. Cannot rule out infection. Culture in this area I did last week showed Staphylococcus lugdunensis few colonies. I am going to try to address this with his [employer] antibiotic that is done so well on the left having linezolid and this should cover the staph 2/1; the patient's wound on his left foot which was the original plantar foot wound thick skin and eschar around the edges even in the total contact cast but the wound surface does not look too bad The real problem is on how his right lateral foot at roughly the base of the fifth metatarsal. The wound is completely necrotic more worrisome than that there is swelling around the edges of this. We have been using silver alginate on both wounds and Keystone on the right foot. Unfortunately I think he is going to require systemic antibiotics while we await cultures. He did not get the x-ray done that we ordered last week [lost the prescription 2/7; disappointingly in the area on the left foot which we are treating with a total contact cast is still not closed although it is much smaller. He continues to have a lot of callus around the wound edge. -Right lateral foot culture I did last week was negative x-ray also  negative for osteomyelitis. 2/15: TCC silver alginate on the left and silver alginate on the right lateral. No real improvement in either area 05/26/2021: T oday, the wounds are roughly the same size as at his previous visit, post-debridement. He continues to endorse fairly substantial drainage, particularly on the right. He has been in a total contact cast on the left. There is still some callus surrounding this lesion. On the right, the periwound skin is quite macerated, along with surrounding callus. The center of the right-sided wound also has some dark, densely adherent material, which is very difficult to remove. 06/02/2021: Today, both wounds are slightly smaller. He has been using zinc oxide ointment around the right ulcer and the degree of maceration has improved markedly. There continues to be an area of nonviable tissue in the center of the right sided ulcer. The left-sided wound, which has been in the total contact cast. Appears clean and the degree of callus around it is less than previously. 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 He had to take the stairs and both wounds have increased in size. The left foot, which has been in a total contact cast, has developed a tunnel tracking to the lateral aspect of his foot. The nonviable tissue in the center of the right-sided ulcer remains recalcitrant to debridement. There is significant undermining surrounding the entirety of the left sided wound. 06/16/2021: The elevator at school has been fixed and the patient has been able to avoid putting as much weight on his wounds over the past week. We converted the left foot wound into a single lesion today, but despite this, the wound is actually smaller. The base is healthy with limited periwound callus. On the right, the central necrotic area is still present. He continues to be quite macerated around the right sided wound, despite  applying barrier cream. This does, however, have the benefit of softening the callus to make it more easily removable. 06/23/2021: Today, the left wound is smaller. The lateral aspect that had  opened up previously is now closed. The wound base has a healthy bed of granulation tissue and minimal slough. Unfortunately, on the right, the wound is larger and continues to be fairly macerated. He has also reopened the wound at his right ankle. He thinks this is due to the gait he has adopted secondary to his total contact cast and boot. 06/30/2021: T oday, both wounds are a little bit larger. The lateral aspect on the left has remained closed. He continues to have significant periwound maceration. The culture that I took from the right sided wound grew a population of bacteria that is not covered by his current [facility] antibiotic. The center of the right- sided wound continues to appear necrotic with nonviable fat. It probes deeper today, but does not reach bone. 07/07/2021: The periwound maceration is a little bit less today. The right lateral foot wound has some areas that appear more viable and the necrotic center also looks a little bit better. The wound on the dorsal surface of his right foot near the ankle is contracting and the surface appears healthy. The left plantar wound surface looks healthy, but there is some new undermining on the medial portion. He did get his new Keystone antibiotic and began applying that to the right foot wound on Saturday. 07/14/2021: The intake nurse reported substantial drainage from his wounds, but the periwound skin actually looks better than is typical for him. The wound on the dorsal surface of his right foot near the ankle is smaller and just has a small open area underneath some dried eschar. The left plantar wound surface looks healthy and there has been no significant accumulation of callus. The right lateral foot wound looks quite a bit better, with the central  portion, which typically appears necrotic, looking more viable albeit pale. 07/22/2021: His left foot is extremely macerated today. The wound is about the same size. The wound on the dorsal surface of his right foot near the ankle had [place], [name] ([id number]) [id number] Page 8 of 22 closed, but he traumatized it removing the dressing and there is a tiny skin tear in that location. The right lateral foot wound is bigger, but the surface appears healthy. 07/30/2021: The wound on the dorsal surface of his right foot near the ankle is closed. The right lateral foot wound again is a little bit bigger due to some undermining. The periwound skin is in better condition, however. He has been applying zinc oxide. The wound surface is a little bit dry today. On the left, he does not have the substantial maceration that we frequently see. The wound itself is smaller and has a clean surface. 08/06/2021: Both wounds seem to have deteriorated over the past week. The right lateral foot wound has a dry surface but the periwound is boggy.. Overall wound dimensions are about the same. On the left, the wound is about the same size, but there is more undermining present underneath periwound callus. 08/13/2021: The right sided wound looks about the same, but on the left there has been substantial deterioration. The undermining continues to extend under periwound callus. Once this was removed, substantial extension of the wound was present. There is no odor or purulent drainage but clearly the wounds have broken down. 08/20/2021: The wounds look about the same today. He has been out of his total contact cast and has just been changing the dressings using topical Keystone with PolyMem Ag, Kerlix and Ace bandages. The wound on the top of his right ankle  has reopened but this is quite small. There was a little bit of purulent material that I expressed when examining this wound. 08/24/2021: After the  aggressive debridement I performed at his last visit, the wounds actually look a little bit better today. They are smaller with the exception of the wound on the top of his right ankle which is a little bit bigger as some more skin pulled off when he was changing his dressing. We are using topical Keystone with PolyMem Ag Kerlix and Ace bandages. 09/02/2021: There has been really no change to any of his wounds. 09/16/2021: The patient was hospitalized last week with nausea, vomiting, and dehydration. He says that while he was in the hospital, his wounds were not really addressed properly. T oday, both plantar foot wounds are larger and the periwound skin is macerated. The wound on the dorsum of his right foot has a scab on the top. The right foot now has a crater where previously he had had nonviable fat. It looks as though this simply died and fell out. The periwound callus is wet. 09/24/2021: His wounds have deteriorated somewhat since his last visit. The wound on the dorsum of his right foot near his ankle is larger and has more nonviable tissue present. The crater in his right foot is even deeper; I cannot quite palpate or probe to bone but I am sure it is close. The wound on his left plantar foot has an odd boggy area in the center that almost feels as though it has fluid within it. He has run out of his topical Keystone antibiotic. We are using silver alginate on his wounds. 09/29/2021: He has developed a new wound on the dorsum of his left foot near his ankle. He says he thinks his wrapping is rubbing in that site. I would concur with this as the wound on his right ankle is larger. The left foot looks about the same. The right foot has the crater that was present last week. No significant slough accumulation, but his foot remains quite swollen and warm despite oral antibiotic therapy. 10/08/2021: All of his wounds look about the same as last week. He did not start his oral antibiotics that are  prescribed until just a couple of days ago; his [employer] compounded antibiotics formula has been changed and he is awaiting delivery of the new recipe. His MRI that was scheduled for earlier this week was canceled as no prior authorization had been obtained; unfortunately the tech responsible sent an email to my old [place] email, which I no longer use nor have access to. 10/18/2021: The wounds on his bilateral dorsal feet near the ankles are both improved. They are smaller and have just some eschar and slough buildup. The left plantar wound has a fair amount of undermining, but the surface is clean. There is some periwound callus accumulation. On the right plantar foot, there is nonviable fat leading to a deep tunnel that tracks towards his dorsal medial foot. There is periwound callus and slough accumulation, as well. His right foot and leg remain swollen as compared to the left. 10/25/2021: The wounds on his bilateral dorsal feet and at the ankles have broken down somewhat. They are little bit larger than last week. The left plantar wound continues to undermine laterally but the surface is clean. The right plantar foot wound shows some decreased depth in the tunnel tracking towards his dorsal medial foot. He has not yet had the Doppler study that I ordered;  it sounds like there is some confusion about the scheduling of the procedure. In addition, the MRI was denied and I have taken steps to appeal the denial. 11/24/2021: Since our last visit, Mr. Mcmartin was admitted to the hospital where an MRI suggested osteomyelitis. He was taken the operating room by podiatry. Bone biopsies were negative for osteomyelitis. They debrided his wounds and applied myriad matrix. He saw them last week and they removed his staples. He is here today to continue his wound healing process. T oday, both of the dorsal foot/ankle wounds are substantially smaller. There is just a little eschar overlying the left sided  wound and some eschar and slough on the right. The right plantar foot ulcer has the healthiest surface of granulation tissue that I have seen to date. A portion of the myriad matrix failed to take and was hanging loose. It appears that myriad morcells were placed into the tunnel closest to the dorsal portion of his foot. These have sloughed off. The left plantar foot ulcer is about the same size, but has a much healthier surface than in the past. Both plantar ulcers have callus and slough accumulation. 12/07/2021: Left dorsal foot/ankle wound is closed. The right dorsal foot/ankle wound is nearly closed and just has a small open area with some eschar and slough. The right plantar foot wound has contracted quite a bit since our last visit. It has a healthy surface with just a little bit of slough accumulation and periwound callus buildup. The left plantar foot wound is about the same size but the surface appears healthy. There is a little slough and periwound callus on this side, as well. 12/15/2021: Both dorsal foot/ankle wounds are closed. The right plantar foot wound is substantially smaller than at our last visit. The tunneling that was present has nearly closed. There is just a little bit of slough buildup. The left plantar foot wound is also a little bit smaller today. The surface is the healthiest that I have ever seen it. Light slough and periwound callus accumulation on this side. 12/22/2021: The dorsal ankle/foot wounds remain closed. The right plantar foot wound continues to contract. There is still a bit of depth at the lateral portion of the wound but the surface has a good granulated appearance. The wound on the left is about the same size, the central indented portion is still adherent. It also is clean with good granulation tissue present. The periwound skin and callus are a bit macerated, however. 12/29/2021: Both ankle wounds remain closed. The right plantar foot wound is less than half the  size that it was last week. There is no depth any longer and the surface has just a little bit of slough and periwound callus. On the left, the wound is not much smaller, but the surface is healthier with good granulation tissue. There is also a little slough and periwound callus buildup. 01/05/2022: The right plantar foot wound continues to contract and is once again about half the size as it was last week. There is just a little bit of surface slough and periwound callus. On the left, the depth of the wound has decreased and the diameter has started to contract. It is also very clean with just a little slough and biofilm present. 01/12/2022: The right plantar foot wound is smaller again today. There is just some periwound callus and slough accumulation. On the left, there is a fair amount of undermining and the surface is a little boggy, but no other  significant change. 01/19/2022: The right plantar wound continues to contract. There is minimal periwound callus accumulation and just a light layer of slough on the surface. On the left, the surface looks better, but there is fairly significant undermining near the 11:00 portion of the wound, aiming towards his great toe. The skin overlying this area is very healthy and has a good fat layer present. No malodor or purulent drainage. 01/26/2022: The right sided wound continues to contract and has just a light layer of slough on the surface. Minimal periwound callus. On the left, he still has substantial undermining and the tissue surface is not as robust as I would like to see. No malodor or purulent drainage, but he did say he had a lot of serous drainage over the weekend. [name], [name] ([id number]) [id number] Page 9 of 22 02/02/2022: The right foot wound is about a quarter of the size that it was at his last visit. There is just a little slough on the surface and some periwound callus. On the left, the undermining persists  and the tissue is still more pale, but he does not seem to have had as much drainage over the last weekend. We are waiting on a wound VAC. Electronic Signature(s) Signed: 02/02/2022 8:51:41 AM By: [name] Maudlin MD FACS Entered By: [name] [name] on 02/02/2022 08:51:41 -------------------------------------------------------------------------------- Physical Exam Details Patient Name: Date of Service: [name] [name], [name] 02/02/2022 8:15 A M Medical Record Number: [id number] Patient Account Number: [id number] Date of Birth/Sex: Treating RN: [birth year] (36 y.o. M) Primary Care Provider: Seward [name] Other Clinician: Referring Provider: Treating Provider/Extender: [name] [name] in Treatment: 118 Constitutional Slightly hypertensive. . . . No acute distress.[name] Kitchen Respiratory Normal work of breathing on room air.. Notes 02/02/2022: The right foot wound is about a quarter of the size that it was at his last visit. There is just a little slough on the surface and some periwound callus. On the left, the undermining persists and the tissue is still more pale, but he does not seem to have had as much drainage over the last weekend. Electronic Signature(s) Signed: 02/02/2022 8:52:09 AM By: [name] Maudlin MD FACS Entered By: [name] [name] on 02/02/2022 08:52:09 -------------------------------------------------------------------------------- Physician Orders Details Patient Name: Date of Service: [name] [name], [name] 02/02/2022 8:15 A M Medical Record Number: [id number] Patient Account Number: [id number] Date of Birth/Sex: Treating RN: [birth year] (36 y.o. 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 - Dr. Celine [name] - Room 1 with [name] Return A Bathing/ Shower/ Hygiene May shower and wash wound with soap and water. Edema Control - Lymphedema / SCD / Other Bilateral Lower Extremities [place], [name] ([id number]) [id number] Page 10 of 22 Elevate legs to the level of the heart or above for 30 minutes daily and/or when sitting, a frequency of: - throughout the day Avoid standing for long periods of time. Exercise regularly Compression stocking or Garment 20-30 mm/Hg pressure to: - to both legs daily Off-Loading Open toe surgical shoe to: - Both feet Additional Orders / Instructions Follow Nutritious Diet Wound Treatment Wound #3 - Foot Wound Laterality: Plantar,  Left, Lateral Cleanser: Soap and Water 1 x Per Day/30 Days Discharge Instructions: May shower and wash wound with dial antibacterial soap and water prior to dressing change. Peri-Wound Care: Zinc Oxide Ointment 30g tube 1 x Per Day/30 Days Discharge Instructions: Apply Zinc Oxide to periwound with each dressing change Peri-Wound Care: Sween Lotion (Moisturizing lotion) 1 x Per Day/30 Days Discharge Instructions: Apply moisturizing lotion as directed Prim Dressing: Hydrofera Blue Ready Foam, 4x5 in (Generic) 1 x Per Day/30 Days ary Discharge Instructions: Apply to wound bed as instructed Secondary Dressing: ABD Pad, 8x10 (Generic) 1 x Per Day/30 Days Discharge Instructions: Apply over primary dressing as directed. Secondary Dressing: Optifoam Non-Adhesive Dressing, 4x4 in (Generic) 1 x Per Day/30 Days Discharge Instructions: Apply over primary dressing as directed. Secondary Dressing: Woven Gauze Sponge, Non-Sterile 4x4 in (Generic) 1 x Per Day/30 Days Discharge Instructions: Apply  over primary dressing as directed. Secondary Dressing: Zetuvit Absorbent Pad, 4x4 (in/in) (Dispense As Written) 1 x Per Day/30 Days Secured With: 41M Medipore H Soft Cloth Surgical T ape, 4 x 10 (in/yd) (Dispense As Written) 1 x Per Day/30 Days Discharge Instructions: Secure with tape as directed. Compression Wrap: Kerlix Roll 4.5x3.1 (in/yd) (Generic) 1 x Per Day/30 Days Discharge Instructions: Apply Kerlix and Coban compression as directed. Compression Wrap: 41M ACE Elastic Bandage With VELCRO Brand Closure, 4 (in) (Generic) 1 x Per Day/30 Days Wound #8 - Foot Wound Laterality: Right, Lateral Cleanser: Soap and Water 1 x Per Day/30 Days Discharge Instructions: May shower and wash wound with dial antibacterial soap and water prior to dressing change. Peri-Wound Care: Sween Lotion (Moisturizing lotion) 1 x Per Day/30 Days Discharge Instructions: Apply moisturizing lotion as directed Prim Dressing: Promogran Prisma Matrix, 4.34 (sq in) (silver collagen) (Generic) 1 x Per Day/30 Days ary Discharge Instructions: Moisten collagen with saline or hydrogel Secondary Dressing: ABD Pad, 8x10 (Generic) 1 x Per Day/30 Days Discharge Instructions: Apply over primary dressing as directed. Secondary Dressing: Optifoam Non-Adhesive Dressing, 4x4 in (Generic) 1 x Per Day/30 Days Discharge Instructions: Apply over primary dressing as directed. Secondary Dressing: Woven Gauze Sponge, Non-Sterile 4x4 in (Generic) 1 x Per Day/30 Days Discharge Instructions: Apply over primary dressing as directed. Secondary Dressing: Zetuvit Absorbent Pad, 4x4 (in/in) (Dispense As Written) 1 x Per Day/30 Days Secured With: 41M Medipore H Soft Cloth Surgical T ape, 4 x 10 (in/yd) (Dispense As Written) 1 x Per Day/30 Days Discharge Instructions: Secure with tape as directed. Compression Wrap: Kerlix Roll 4.5x3.1 (in/yd) (Generic) 1 x Per Day/30 Days Discharge Instructions: Apply Kerlix and Coban compression as directed. Compression  Wrap: 41M ACE Elastic Bandage With VELCRO Brand Closure, 4 (in) (Generic) 1 x Per Day/30 Days Electronic Signature(s) Signed: 02/02/2022 9:00:16 AM By: [name] Maudlin MD [place], [name] ([id number]) [id number] Page 11 of 22 Entered By: [name] [name] on 02/02/2022 08:54:50 -------------------------------------------------------------------------------- Problem List Details Patient Name: Date of Service: [name] [name] 02/02/2022 8:15 A M Medical Record Number: [id number] Patient Account Number: [id number] Date of Birth/Sex: Treating RN: [birth year] (36 y.o. M) Primary Care Provider: Seward [name] Other Clinician: Referring Provider: Treating Provider/Extender: [name] [name] in Treatment: 23 Active Problems ICD-10 Encounter Code Description Active Date MDM Diagnosis E11.621 Type 2 diabetes mellitus with foot ulcer 10/24/2019 No Yes L97.528 Non-pressure chronic ulcer of other part of left foot with other specified 10/24/2019 No Yes severity L97.518 Non-pressure chronic ulcer of other part of right foot with other specified 10/24/2019 No Yes severity Inactive Problems ICD-10 Code  Description Active Date Inactive Date L97.518 Non-pressure chronic ulcer of other part of right foot with other specified severity 07/14/2020 07/14/2020 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, right ankle and foot 10/24/2019 10/24/2019 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 08/10/2020 08/10/2020 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, left ankle and foot 10/24/2019 10/24/2019 L97.322 Non-pressure chronic ulcer of left ankle with fat layer exposed 09/29/2021 09/29/2021 B95.62 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 10/24/2019 10/24/2019 classified elsewhere Resolved Problems Electronic Signature(s) Signed: 02/02/2022 8:48:59 AM By: [name] Maudlin MD FACS Entered By: [name] [name] on 02/02/2022 08:48:59 [place], [name] ([id number]) [id number] Page 12 of 22 -------------------------------------------------------------------------------- Progress Note Details Patient Name: Date of Service: [name] [name] 02/02/2022 8:15 A M Medical Record Number: [id number] Patient Account Number: [id number] Date of Birth/Sex: Treating RN: 1985/05/20 (36 y.o. 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 He comes in for review of a wound over the plantar fifth metatarsal head extending into the lateral part of the foot. He was followed for this previously by his podiatrist Dr. Cornelius [name]. As the patient tells his story he went to see podiatry first for a swelling he developed on the lateral part of his fifth metatarsal head in May. He states this was "open" by podiatry and the area closed. He was followed up in June and it was again opened callus removed and it closed promptly. There were plans being made for surgery on the fifth metatarsal head in June however his blood sugar was apparently too high for anesthesia. Apparently the area was debrided and opened again in June and it is never closed since. Looking over the records from podiatry I am really not able to follow this. It was clear when he was first seen it was before 5/14 at that point he already had a wound. By 5/17  the ulcer was resolved. I do not see anything about a procedure. On 5/28 noted to have pre-ulcerative moderate keratosis. X-ray noted 1/5 contracted toe and tailor's bunion and metatarsal deformity. On a visit date on 09/28/2018 the dorsal part of the left foot it healed and resolved. There was concern about swelling in his lower extremity he was sent to the ER.. As far as I can tell he was seen in the ER on 7/12 with an ulcer on his left foot. A DVT rule out of the left leg was negative. I do not think I have complete records from podiatry but I am not able to verify the procedures this patient states he had. He states after the last procedure the wound has never closed although I am not able to follow this in the records I have from podiatry. He has not had a recent x-ray The patient has been using Neosporin on the wound. He is wearing a Darco shoe. He is still very active up on his foot working and exercising. Past medical history; type 2 diabetes ketosis-prone, leg swelling with a negative DVT study in July. Non-smoker ABI in our  clinic was 0.85 on the left 10/16; substantial wound on the plantar left fifth met head extending laterally almost to the dorsal fifth MTP. We have been using silver alginate we gave him a Darco forefoot off loader. An x-ray did not show evidence of osteomyelitis did note soft tissue emphysema which I think was due to gas tracking through an open wound. There is no doubt in my mind he requires an MRI 10/23; MRI not booked until 3 November at the earliest this is largely due to his glucose sensor in the right arm. We have been using silver alginate. There has been an improvement 10/29; I am still not exactly sure when his MRI is booked for. He says it is the third but it is the 10th in epic. This definitely needs to be done. He is running a low-grade fever today but no other symptoms. No real improvement in the 1 02/26/2019 patient presents today for a follow-up visit here  in our clinic he is last been seen in the clinic on October 29. Subsequently we were working on getting MRI to evaluate and see what exactly was going on and where we would need to go from the standpoint of whether or not he had osteomyelitis and again what treatments were going be required. Subsequently the patient ended up being admitted to the hospital on 02/07/2019 and was discharged on 02/14/2019. This is a somewhat interesting admission with a discharge diagnosis of pneumonia due to COVID-19 although he was positive for COVID-19 when tested at the urgent care but negative x2 when he was actually in the hospital. With that being said he did have acute respiratory failure with hypoxia and it was noted he also have a left foot ulceration with osteomyelitis. With that being said he did require oxygen for his pneumonia and I level 4 L. He was placed on antivirals and steroids for the COVID-19. [place]. [place] he did subsequently discharged home and since being home has done much better in that regard. The CT angiogram did not show any pulmonary embolism. With regard to the osteomyelitis the patient was placed on vancomycin and Zosyn while in the hospital but has been changed to Augmentin at discharge. It was also recommended that he follow- up with wound care and podiatry. Podiatry however wanted him to see [name] according to the patient prior to them doing anything further. His hemoglobin A1c was 9.9 as noted in the hospital. Have an MRI of the left foot performed while in the hospital on 02/04/2019. 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 There is an overlying plantar open wound noted an abscess tracking back along the lateral aspect of the fifth metatarsal shaft. There is otherwise diffuse cellulitis and mild fasciitis without findings of  polymyositis. The patient did have recently pneumonia secondary to COVID-19 I looked in the chart through epic and it does appear that the patient may need to have an additional x-ray just to ensure everything is cleared and that he has no airspace disease prior to putting him into the [name]. 03/05/2019; patient was readmitted to the clinic last week. He was hospitalized twice for a viral upper respiratory tract infection from 11/1 through 11/4 and then 11/5 through 11/12 ultimately this turned out to be Covid pneumonitis. Although he was discharged on oxygen he is not using it. He says he feels  fine. He has no exercise limitation no cough no sputum. His O2 sat in our clinic today was 100% on room air. 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 He received Vanco and Zosyn in the hospital and then was discharged on 2 weeks of Augmentin. I do not see any relevant cultures. He was supposed to follow-up with infectious disease but I do not see that he has an appointment. 12/8; patient saw Dr. Novella Olive of infectious disease last week. He felt that he had had adequate antibiotic therapy. He did not go to follow-up with Dr. Amalia Hailey of podiatry and I have again talked to him about the pros and cons of this. He does not want to consider a ray amputation of this time. He is aware of the risks of recurrence, migration etc. He started HBO today and tolerated this well. He can complete the Augmentin that I gave him last week. I have looked over the lab work that Dr. Chana Bode ordered his C-reactive protein was 3.3 and his sedimentation rate was 17. The C-reactive protein is never really been measurably that high in this patient 12/15; not much change in the wound today however he has undermining along the lateral part of the foot again more extensively than last week. He has some rims of  epithelialization. We have been using silver alginate. He is undergoing hyperbarics but did not dive today 12/18; in for his obligatory first total contact cast change. Unfortunately there was pus coming from the undermining area around his fifth metatarsal head. This was cultured but will preclude reapplication of a cast. He is seen in conjunction with HBO 12/24; patient had staph lugdunensis in the wound in the undermining area laterally last time. We put him on doxycycline which should have covered this. The wound looks better today. I am going to give him another week of doxycycline before reattempting the total contact cast Wenzler, [name] ([id number]) [id number] Page 13 of 22 12/31; the patient is completing antibiotics. Hemorrhagic debris in the distal part of the wound with some undermining distally. He also had hyper granulation. Extensive debridement with a #5 curette. The infected area that was on the lateral part of the fifth met head is closed over. I do not think he needs any more antibiotics. Patient was seen prior to HBO. Preparations for a total contact cast were made in the cast will be placed post hyperbarics 04/11/19; once again the patient arrives today without complaint. He had been in a cast all week noted that he had heavy drainage this week. This resulted in large raised areas of macerated tissue around the wound 1/14; wound bed looks better slightly smaller. Hydrofera Blue has been changing himself. He had a heavy drainage last week which caused a lot of maceration around the wound so I took him out of a total contact cast he says the drainage is actually better this week He is seen today in conjunction with HBO 1/21; returns to clinic. He was up in [place] for a day or 2 attending a funeral. He comes back in with the wound larger and with a large area of exposed bone. He had osteomyelitis and septic arthritis of the fifth left metatarsal head while  he was in hospital. He received IV antibiotics in the hospital for a prolonged period of time then 3 weeks of Augmentin. Subsequently I gave him 2 weeks of doxycycline  for more superficial wound infection. When I saw this last week the wound was smaller the surface of the wound looks satisfactory. 1/28; patient missed hyperbarics today. Bone biopsy I did last time showed Enterococcus faecalis and Staphylococcus lugdunensis . He has a wide area of exposed bone. We are going to use silver alginate as of today. I had another ethical discussion with the patient. 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 In this situation I think the likelihood of healing this is low. Therefore I have recommended a ray amputation and with the patient's agreement I have referred him to Dr. Doran Durand. The other issue is that his compliance with hyperbarics has been minimal because of his work schedule and given his underlying decision I am going to stop this today READMISSION 10/24/2019 MRI 09/29/2019 left foot IMPRESSION: 1. Apparent skin ulceration inferior and lateral to the 5th metatarsal base with underlying heterogeneous T2 signal and enhancement in the subcutaneous fat. Small peripherally enhancing fluid collections along the plantar and lateral aspects of the 5th metatarsal base suspicious for abscesses. 2. Interval amputation through the mid 5th metatarsal with nonspecific low-level marrow edema and enhancement. Given the proximity to the adjacent soft tissue inflammatory changes, osteomyelitis cannot be excluded. 3. The additional bones appear unremarkable. MRI 09/29/2019 right foot IMPRESSION: 1. Soft tissue ulceration lateral to the 5th MTP joint. There is low-level T2 hyperintensity within the 4th and 5th metatarsal heads and adjacent proximal phalanges without abnormal T1 signal or cortical destruction. These findings are nonspecific and could be seen with early marrow  edema, hyperemia or early osteomyelitis. No evidence of septic joint. 2. Mild tenosynovitis and synovial enhancement associated with the extensor digitorum tendons at the level of the midfoot. 3. Diffuse low-level muscular T2 hyperintensity and enhancement, most consistent with diabetic myopathy. LEFT FOOT BONE Methicillin resistant staphylococcus aureus Staphylococcus lugdunensis MIC MIC CIPROFLOXACIN >=8 RESISTANT Resistant <=0.5 SENSI... Sensitive CLINDAMYCIN <=0.25 SENS... Sensitive >=8 RESISTANT Resistant ERYTHROMYCIN >=8 RESISTANT Resistant >=8 RESISTANT Resistant GENTAMICIN <=0.5 SENSI... Sensitive <=0.5 SENSI... Sensitive Inducible Clindamycin NEGATIVE Sensitive NEGATIVE Sensitive OXACILLIN >=4 RESISTANT Resistant 2 SENSITIVE Sensitive RIFAMPIN <=0.5 SENSI... Sensitive <=0.5 SENSI... Sensitive TETRACYCLINE <=1 SENSITIVE Sensitive <=1 SENSITIVE Sensitive TRIMETH/SULFA <=10 SENSIT Sensitive <=10 SENSIT Sensitive ... [name] Kitchen.. VANCOMYCIN 1 SENSITIVE Sensitive <=0.5 SENSI... Sensitive Right foot bone . Component 3 wk ago Specimen Description BONE Special Requests RIGHT 4 METATARSAL SAMPLE B Gram Stain NO WBC SEEN NO ORGANISMS SEEN Culture RARE METHICILLIN RESISTANT STAPHYLOCOCCUS AUREUS NO ANAEROBES ISOLATED Performed at [facility], [place] [address], [place], [place] [address] Report Status 10/08/2019 FINAL Organism ID, Bacteria METHICILLIN RESISTANT STAPHYLOCOCCUS AUREUS Resulting Agency [facility] Susceptibility Methicillin resistant staphylococcus aureus MIC CIPROFLOXACIN >=8 RESISTANT Resistant CLINDAMYCIN <=0.25 SENS... Sensitive ERYTHROMYCIN >=8 RESISTANT Resistant GENTAMICIN <=0.5 SENSI... Sensitive Inducible Clindamycin NEGATIVE Sensitive OXACILLIN >=4 RESISTANT Resistant [name], [name] ([id number]) [id number] Page 14 of 22 RIFAMPIN <=0.5 SENSI... Sensitive TETRACYCLINE <=1 SENSITIVE Sensitive TRIMETH/SULFA <=10 SENSIT  Sensitive ... VANCOMYCIN 1 SENSITIVE Sensitive This is a patient we had in clinic earlier this year with a wound over his left fifth metatarsal head. 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 Doran Durand on 05/16/2019. He seems to have developed problems with wounds on his bilateral feet  in June 2021 although he may have had problems earlier than this. He was in an urgent care with a right foot ulcer on 09/26/2019 and given a course of doxycycline. This was apparently after having trouble getting into see orthopedics. He was seen by podiatry on 09/28/2019 noted to have bilateral lower extremity ulcers including the left lateral fifth metatarsal base and the right subfifth met head. It was noted that had purulent drainage at that time. He required hospitalization from 6/20 through 7/2. This was because of worsening right foot wounds. He underwent bilateral operative incision and drainage and bone biopsies bilaterally. Culture results are listed above. He has been referred back to clinic by Dr. Jacqualyn Posey of podiatry. He is also followed by Dr. Megan Salon who saw him yesterday. He was discharged from hospital on Zyvox Flagyl and Levaquin and yesterday changed to doxycycline Flagyl and Levaquin. His inflammatory markers on 6/26 showed a sedimentation rate of 129 and a C-reactive protein of 5. This is improved to 14 and 1.3 respectively. This would indicate improvement. ABIs in our clinic today were 1.23 on the right and 1.20 on the left 11/01/2019 on evaluation today patient appears to be doing fairly well in regard to the wounds on his feet at this point. Fortunately there is no signs of active infection at this time. No fevers, chills,  nausea, vomiting, or diarrhea. He currently is seeing infectious disease and still under their care at this point. Subsequently he also has both wounds which she has not been using collagen on as he did not receive that in his packaging he did not call us and let us know that. Apparently that just was missed on the order. Nonetheless we will get that straightened out today. 8/9-Patient returns for bilateral foot wounds, using Prisma with hydrogel moistened dressings, and the wounds appear stable. Patient using surgical shoes, avoiding much pressure or weightbearing as much as possible 8/16; patient has bilateral foot wounds. 1 on the right lateral foot proximally the other is on the left mid lateral foot. Both required debridement of callus and thick skin around the wounds. We have been using silver collagen 8/27; patient has bilateral lateral foot wounds. The area on the left substantially surrounded by callus and dry skin. This was removed from the wound edge. The underlying wound is small. The area on the right measured somewhat smaller today. 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 Unfortunately I did not update his antibiotics during today's visit. 9/10 I reviewed Dr. Hale Bogus last notes he felt he had completed antibiotics his inflammatory markers were reasonably well controlled. He has a small wound on the lateral left foot and a tiny area on the right which is just above closed. He is using Hydrofera Blue with border foam he has bilateral surgical shoes 9/24; 2 week f/u. doing well. right foot is closed. left foot still undermined. 10/14; right foot remains closed at the fifth met head. The area over the base of the left fifth metatarsal has a small open area but considerable undermining towards the plantar foot. Thick callus skin around this suggests an adequate pressure relief. We have talked about this. He says he is  going to go back into his cam boot. I suggested a total contact cast he did not seem enamored with this suggestion 10/26; left foot base of the fifth metatarsal. Same condition as last time. He has skin over the area with an open wound  however the skin is not adherent. He went to see Dr. Earleen Newport who did an x-ray and culture of his foot I have not reviewed the x-ray but the patient was not told anything. He is on doxycycline 11/11; since the patient was last here he was in the emergency room on 10/30 he was concerned about swelling in the left foot. They did not do any cultures or x-rays. They changed his antibiotics to cephalexin. Previous culture showed group B strep. The cephalexin is appropriate as doxycycline has less than predictable coverage. Arrives in clinic today with swelling over this area under the wound. He also has a new wound on the right fifth metatarsal head 11/18; the patient has a difficult wound on the lateral aspect of the left fifth metatarsal head. The wound was almost ballotable last week I opened it slightly expecting to see purulence however there was just bleeding. I cultured this this was negative. X-ray unchanged. We are trying to get an MRI but I am not sure were going to be able to get this through his insurance. He also has an area on the right lateral fifth metatarsal head this looks healthier 12/3; the patient finally got our MRI. Surprisingly this did not show osteomyelitis. I did show the soft tissue ulceration at the lateral plantar aspect of the fifth metatarsal base with a tiny residual 6 mm abscess overlying the superficial fascia I have tried to culture this area I have not been able to get this to grow anything. Nevertheless the protruding tissue looks aggravated. I suspect we should try to treat the underlying "abscess with broad-spectrum antibiotics. I am going to start him on Levaquin and Flagyl. He has much less edema in his legs and I am going to continue  to wrap his legs and see him weekly 12/10. I started Levaquin and Flagyl on him last week. He just picked up the Flagyl apparently there was some delay. The worry is the wound on the left fifth metatarsal base which is substantial and worsening. His foot looks like he inverts at the ankle making this a weightbearing surface. Certainly no improvement in fact I think the measurements of this are somewhat worse. We have been using 12/17; he apparently just got the Levaquin yesterday this is 2 weeks after the fact. He has completed the Flagyl. The area over the left fifth metatarsal base still has protruding granulation tissue although it does not look quite as bad as it did some weeks ago. He has severe bilateral lymphedema although we have not been treating him for wounds on his legs this is definitely going to require compression. There was so much edema in the left I did not wish to put him in a total contact cast today. I am going to increase his compression from 3-4 layer. The area on the right lateral fifth met head actually look quite good and superficial. 12/23; patient arrived with callus on the right fifth met head and the substantial hyper granulated callused wound on the base of his fifth metatarsal. He says he is completing his Levaquin in 2 days but I do not think that adds up with what I gave him but I will have to double check this. We are using Hydrofera Blue on both areas. My plan is to put the left leg in a cast the week after New Year's 04/06/2020; patient's wounds about the same. Right lateral fifth metatarsal head and left lateral foot over the base of the fifth metatarsal. There  is undermining on the left lateral foot which I removed before application of total contact cast continuing with Hydrofera Blue new. Patient tells me he was seen by endocrinology today lab work was done [Dr. Kerr]. Also wondering whether he was referred to cardiology. I went over some lab work from previously  does not have chronic renal failure certainly not nephrotic range proteinuria he does have very poorly controlled diabetes but this is not his most updated lab work. Hemoglobin A1c has been over 11 1/10; the patient had a considerable amount of leakage towards mid part of his left foot with macerated skin however the wound surface looks better the area on the right lateral fifth met head is better as well. I am going to change the dressing on the left foot under the total contact cast to silver alginate, continue with Hydrofera Blue on the right. 1/20; patient was in the total contact cast for 10 days. Considerable amount of drainage although the skin around the wound does not look too bad on the left foot. The area on the right fifth metatarsal head is closed. Our nursing staff reports large amount of drainage out of the left lateral foot wound 1/25; continues with copious amounts of drainage described by our intake staff. PCR culture I did last week showed E. coli and Enterococcus faecalis and low quantities. Multiple resistance genes documented including extended spectrum beta lactamase, MRSA, MRSE, quinolone, tetracycline. The wound is not quite as good this week as it was 5 days ago but about the same size 2/3; continues with copious amounts of malodorous drainage per our intake nurse. The PCR culture I did 2 weeks ago showed E. coli and low quantities of Enterococcus. There were multiple resistance genes detected. I put Neosporin on him last week although this does not seem to have helped. The wound is slightly deeper today. Offloading continues to be an issue here although with the amount of drainage she has a total contact cast is just not going to work 2/10; moderate amount of drainage. Patient reports he cannot get his stocking on over the dressing. I told him we have to do that the nurse gave him suggestions on how to make this work. The wound is on the bottom and lateral part of his left  foot. Is cultured predominantly grew low amounts of Enterococcus, E. coli and anaerobes. There were multiple resistance genes detected including extended spectrum beta lactamase, quinolone, tetracycline. I could not think of an easy oral combination to address this so for now I am going to do topical antibiotics provided by [facility] I think the main agents here are vancomycin and an aminoglycoside. We have to be able to give him access to the wounds to get the topical antibiotic on 2/17; moderate amount of drainage this is unchanged. He has his Keystone topical antibiotic against the deep tissue culture organisms. He has been using this and changing the dressing daily. Silver alginate on the wound surface. 2/24; using Keystone antibiotic with silver alginate on the top. He had too much drainage for a total contact cast at one point although I think that is improving and I think in the next week or 2 it might be possible to replace a total contact cast I did not do this today. In general the wound surface looks healthy however he continues to have thick rims of skin and subcutaneous tissue around the wide area of the circumference which I debrided Sutley, [name] ([id number]) [id number] Page 15 of 22  06/04/2020 upon evaluation today patient appears to be doing well in regard to his wound. I do feel like he is showing signs of improvement. There is little bit of callus and dead tissue around the edges of the wound as well as what appears to be a little bit of a sinus tract that is off to the side laterally I would perform debridement to clear that away today. 3/17; left lateral foot. The wound looks about the same as I remember. Not much depth surface looks healthy. No evidence of infection 3/25; left lateral foot. Wound surface looks about the same. Separating epithelium from the circumference. There really is no evidence of infection here however not making progress by my  view 3/29; left lateral foot. Surface of the wound again looks reasonably healthy still thick skin and subcutaneous tissue around the wound margins. There is no evidence of infection. One of the concerns being brought up by the nurses has again the amount of drainage vis--vis continued use of a total contact cast 4/5; left lateral foot at roughly the base of the fifth metatarsal. Nice healthy looking granulated tissue with rims of epithelialization. The overall wound measurements are not any better but the tissue looks healthy. The only concern is the amount of drainage although he has no surrounding maceration with what we have been doing recently to absorb fluid and protect his skin. He also has lymphedema. 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 He states that the kerlix/coban under the TCC rolled down and could not get it back up. He has been using an offloading scooter and has somehow hurt his right foot using this device. This happened last week. He states that the side of his right foot developed a blister and opened. The top of his foot also has a few small open wounds he thinks is due to his socks rubbing in his shoes. He has not been using any dressings to the wound. He denies purulent drainage, fever/chills or erythema to the wounds. 4/22; patient presents for 1 week follow-up. He developed new wounds to the right foot that were evaluated at last clinic visit. He continues to have a total contact cast to the left leg and he reports no issues. He has been using silver collagen to the right foot wounds with no issues. He denies purulent drainage, fever/chills or erythema to the right foot wounds. He has no complaints today 4/25; patient  presents for 1 week follow-up. He has a total contact cast of the left leg and reports no issues. He has been using silver alginate to the right foot wound. He denies purulent drainage, fever/chills or erythema to the right foot wounds. 5/2 patient presents for 1 week follow-up. T contact cast on the left. The wound which is on the base of the plantar foot at the base of the fifth metatarsal otal actually looks quite good and dimensions continue to gradually contract. HOWEVER the area on the right lateral fifth metatarsal head is much larger than what I remember from 2 weeks ago. Once more is he has significant levels of hypergranulation. Noteworthy that he had this same hyper granulated response on his wound on the left foot at one point in time. So much  so that he I thought there was an underlying fluid collection. Based on this I think this just needs debridement. 5/9; the wound on the left actually continues to be gradually smaller with a healthy surface. Slight amount of drainage and maceration of the skin around but not too bad. However he has a large wound over the right fifth metatarsal head very much in the same configuration as his left foot wound was initially. I used silver nitrate to address the hyper granulated tissue no mechanical debridement 5/16; area on the left foot did not look as healthy this week deeper thick surrounding macerated skin and subcutaneous tissue. oo The area on the right foot fifth met head was about the same oo The area on the right ankle that we identified last week is completely broken down into an open wound presumably a stocking rubbing issue 5/23; patient has been using a total contact cast to the left side. He has been using silver alginate underneath. He has also been using silver alginate to the right foot wounds. He has no complaints today. He denies any signs of infection. 5/31; the left-sided wound looks some better measure smaller surface  granulation looks better. We have been using silver alginate under the total contact cast oo The large area on his right fifth met head and right dorsal foot look about the same still using silver alginate 6/6; neither side is good as I was hoping although the surface area dimensions are better. A lot of maceration on his left and right foot around the wound edge. Area on the dorsal right foot looks better. He says he was traveling. I am not sure what does the amount of maceration around the plantar wounds may be drainage issues 6/13; in general the wound surfaces look quite good on both sides. Macerated skin and raised edges around the wound required debridement although in general especially on the left the surface area seems improved. oo The area on the right dorsal ankle is about the same I thought this would not be such a problem to close 6/20; not much change in either wound although the one on the right looks a little better. Both wounds have thick macerated edges to the skin requiring debridements. We have been using silver alginate. The area on the dorsal right ankle is still open I thought this would be closed. 6/28; patient comes in today with a marked deterioration in the right foot wound fifth met head. Wide area of exposed bone this is a drastic change from last time. The area on the left there we have been casting is stagnant. We have been using silver alginate in both wound areas. 7/5; bone culture I did for PCR last time was positive for Pseudomonas, group B strep, Enterococcus and Staph aureus. There was no suggestion of methicillin resistance or ampicillin resistant genes. This was resistant to tetracycline however He comes into the clinic today with the area over his right plantar fifth metatarsal head which had been doing so well 2 weeks ago completely necrotic feeling bone. I do not know that this is going to be salvageable. The left foot wound is certainly no smaller but it  has a better surface and is superficial. 7/8; patient called in this morning to say that his total contact cast was rubbing against his foot. He states he is doing fine overall. He denies signs of infection. 7/12; continued deterioration in the wound over the right fifth metatarsal head crumbling bone. This is not  going to be salvageable. The patient agrees and wants to be referred to Dr. Doran Durand which we will attempt to arrange as soon as possible. I am going to continue him on antibiotics as long as that takes so I will renew those today. The area on the left foot which is the base of the fifth metatarsal continues to look somewhat better. Healthy looking tissue no depth no debridement is necessary here. 7/20; the patient was kindly seen by Dr. Doran Durand of orthopedics on 10/19/2020. 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 Towards this end we have taken him out of the total contact cast on the left we will put him in a wrap with Hydrofera Blue. As I understand things surgery is planned for 7/21 7/27; patient had his surgery last Thursday. 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 Apparently everything went well we did not still disturb that today The area on the left foot actually looks quite good. He has been much less mobile which probably explains this he did not seem to do well in the total contact cast secondary to drainage and maceration I think. 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 Fortunately there is no evidence of active infection at this time. No fevers, chills, nausea, vomiting, or diarrhea. Overall I think that he is actually doing extremely well. Nonetheless I do believe that he is staying off of this more following the surgery in his right foot that is the reason the left is  doing so great. 8/16; left plantar foot wound. This looks smaller than the last time I saw this he is using Hydrofera Blue. The surgical wound on the right foot is being followed by Dr. Doran Durand we did not look at this today. He has surgical shoes on both feet 8/23; left plantar foot wound not as good this week. Surrounding macerated skin and subcutaneous tissue everything looks moist and wet. I do not think he is offloading this adequately. He is using a surgical shoe Apparently the right foot surgical wound is not open although I did not check his foot 8/31; left plantar foot lateral aspect. Much improved this week. He has no maceration. Some improvement in the surface area of the wound but most [name], [name] ([id number]) [id number] Page 16 of 22 impressively the depth is come in we are using silver alginate. The patient is a Product/process development scientist. He is asked that we write him a letter so he can go back to work. I have also tried to see if we can write something that will allow him to limit the amount of time that he is on his foot at work. 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 Hopefully the latter can be avoided 9/6; I believe the patient missed an appointment last week. He arrives in today with a wound looking roughly the same certainly no better. Undermining laterally and also inferiorly. We used molecuLight today in training with the patient's permission.. We are using silver alginate 9/21 wound is measuring bigger this week although this may have to do with the aggressive circumferential debridement last week in response to the blush fluorescence on the MolecuLight. Culture I did last week  showed significant MSSA and E. coli. I put him on Augmentin but he has not started it yet. We are also going to send this for compounded antibiotics at [facility]. There is no evidence of systemic  infection 9/29; silver alginate. His Keystone arrived. He is completing Augmentin in 2 days. Offloading in a cam boot. Moderate drainage per our intake staff 10/5; using silver alginate. He has been using his [place]. He has completed his Augmentin. Per our intake nurse still a lot of drainage, far too much to consider a total contact cast. Wound measures about the same. He had the same undermining area that I defined last week from a roughly 11-3. I remove this today 10/12; using silver alginate he is using the [place]. He comes in for a nurse visit hence we are applying [employer] twice a week. Measuring slightly better today and less notable drainage. Extensive debridement of the wound edge last time 10/18; using topical Keystone and silver alginate and a soft cast. Wound measurements about the same. Drainage was through his soft cast. We are changing this twice a week Tuesdays and Friday 10/25; comes in with moderate drainage. Still using Keystone silver alginate and a soft cast. Wound dimensions completely the same.He has a lot of edema in the left leg he has lymphedema. Asking for [name] to consider wrapping him as he cannot get his stocking on over the soft cast 11/2; comes in with moderate to large drainage slightly smaller in terms of width we have been using [place]. His wound looks satisfactory but not much improvement 11/4; patient presents today for obligatory cast change. Has no issues or complaints today. He denies signs of infection. 11/9; patient traveled this weekend to [place], was on the cast quite a bit. Staining of the cast with black material from his walking boot. Drainage was not quite as bad as we feared. Using silver alginate and Keystone 11/16; we do not have size for cast therefore we have been putting a soft cast on him since the change on Friday. Still a significant amount of drainage necessitating changing twice a week. We have been using the Keystone at cast changes either  hard or soft as well as silver alginate Comes in the clinic with things actually looking fairly good improvement in width. He says his offloading is about the same 02/24/2021 upon evaluation today patient actually comes back in and is doing excellent in regard to his foot ulcer this is significantly smaller even compared to the last visit. The soft cast seems to have done extremely well for him which is great news. I do not see any signs of infection minimal debridement will be needed today. 11/30; left lateral foot much improved half a centimeter improvement in surface area. No evidence of infection. He seems to be doing better with the soft cast in the TCC therefore we will continue with this. He comes back in later in the week for a change with the nurses. This is due to drainage 12/6; no improvement in dimensions. Under illumination some debris on the surface we have been using silver alginate, soft cast. If there is anything optimistic here he seems to have have less drainage 12/13. Dimensions are improved both length and width and slightly in depth. Appears to be quite healthy today. Raised edges of this thick skin and callus around the edges however. He is in a soft cast were bringing him back once for a change on Friday. Drainage is better 12/20. Dimensions are improved. He  still has raised edges of thick skin and callus around the edges. We are using a soft cast 12/28; comes in today with thick callus around the wound. Using silver under alginate under a soft cast. I do not think there is much improvement in any measurement 2023 04/06/2021; patient was put in a total contact cast. Unfortunately not much change in surface area 1/10; not much different still thick callus and skin around the edge in spite of the total contact cast. This was just debrided last week we have been using the [facility] compounded antibiotic and silver alginate under a total contact cast 1/18 the patient's wound on the  left side is doing nicely. smaller HOWEVER he comes in today with a wound on the right foot laterally. blister most likely serosangquenous drainage 1/24; the patient continues to do well in terms of the plantar left foot which is continued to contract using silver alginate under the total contact cast HOWEVER the right lateral foot is bigger with denuded skin around the edges. I used pickups and a #15 scalpel to remove this this looks like the remanence of a large blister. Cannot rule out infection. Culture in this area I did last week showed Staphylococcus lugdunensis few colonies. I am going to try to address this with his [employer] antibiotic that is done so well on the left having linezolid and this should cover the staph 2/1; the patient's wound on his left foot which was the original plantar foot wound thick skin and eschar around the edges even in the total contact cast but the wound surface does not look too bad The real problem is on how his right lateral foot at roughly the base of the fifth metatarsal. The wound is completely necrotic more worrisome than that there is swelling around the edges of this. We have been using silver alginate on both wounds and Keystone on the right foot. Unfortunately I think he is going to require systemic antibiotics while we await cultures. He did not get the x-ray done that we ordered last week [lost the prescription 2/7; disappointingly in the area on the left foot which we are treating with a total contact cast is still not closed although it is much smaller. He continues to have a lot of callus around the wound edge. -Right lateral foot culture I did last week was negative x-ray also negative for osteomyelitis. 2/15: TCC silver alginate on the left and silver alginate on the right lateral. No real improvement in either area 05/26/2021: T oday, the wounds are roughly the same size as at his previous visit, post-debridement. He continues to endorse fairly  substantial drainage, particularly on the right. He has been in a total contact cast on the left. There is still some callus surrounding this lesion. On the right, the periwound skin is quite macerated, along with surrounding callus. The center of the right-sided wound also has some dark, densely adherent material, which is very difficult to remove. 06/02/2021: Today, both wounds are slightly smaller. He has been using zinc oxide ointment around the right ulcer and the degree of maceration has improved markedly. There continues to be an area of nonviable tissue in the center of the right sided ulcer. The left-sided wound, which has been in the total contact cast. Appears clean and the degree of callus around it is less than previously. 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 He had to take the stairs and  both wounds have increased in size. The left foot, which has been in a total contact cast, has developed a tunnel tracking to the lateral aspect of his foot. The nonviable tissue in the center of the right-sided ulcer remains recalcitrant to debridement. There is significant undermining surrounding the entirety of the left sided wound. 06/16/2021: The elevator at school has been fixed and the patient has been able to avoid putting as much weight on his wounds over the past week. We converted the left foot wound into a single lesion today, but despite this, the wound is actually smaller. The base is healthy with limited periwound callus. On the right, the central necrotic area is still present. He continues to be quite macerated around the right sided wound, despite applying barrier cream. This does, however, have the benefit of softening the callus to make it more easily removable. 06/23/2021: Today, the left wound is smaller. The lateral aspect that had opened up previously is now closed. The wound base has a healthy bed of  granulation [name], [name] ([id number]) [id number] Page 17 of 22 tissue and minimal slough. Unfortunately, on the right, the wound is larger and continues to be fairly macerated. He has also reopened the wound at his right ankle. He thinks this is due to the gait he has adopted secondary to his total contact cast and boot. 06/30/2021: T oday, both wounds are a little bit larger. The lateral aspect on the left has remained closed. He continues to have significant periwound maceration. The culture that I took from the right sided wound grew a population of bacteria that is not covered by his current [facility] antibiotic. The center of the right- sided wound continues to appear necrotic with nonviable fat. It probes deeper today, but does not reach bone. 07/07/2021: The periwound maceration is a little bit less today. The right lateral foot wound has some areas that appear more viable and the necrotic center also looks a little bit better. The wound on the dorsal surface of his right foot near the ankle is contracting and the surface appears healthy. The left plantar wound surface looks healthy, but there is some new undermining on the medial portion. He did get his new Keystone antibiotic and began applying that to the right foot wound on Saturday. 07/14/2021: The intake nurse reported substantial drainage from his wounds, but the periwound skin actually looks better than is typical for him. The wound on the dorsal surface of his right foot near the ankle is smaller and just has a small open area underneath some dried eschar. The left plantar wound surface looks healthy and there has been no significant accumulation of callus. The right lateral foot wound looks quite a bit better, with the central portion, which typically appears necrotic, looking more viable albeit pale. 07/22/2021: His left foot is extremely macerated today. The wound is about the same size. The wound on the  dorsal surface of his right foot near the ankle had closed, but he traumatized it removing the dressing and there is a tiny skin tear in that location. The right lateral foot wound is bigger, but the surface appears healthy. 07/30/2021: The wound on the dorsal surface of his right foot near the ankle is closed. The right lateral foot wound again is a little bit bigger due to some undermining. The periwound skin is in better condition, however. He has been applying zinc oxide. The wound surface is a little bit dry today. On the left, he does not  have the substantial maceration that we frequently see. The wound itself is smaller and has a clean surface. 08/06/2021: Both wounds seem to have deteriorated over the past week. The right lateral foot wound has a dry surface but the periwound is boggy.. Overall wound dimensions are about the same. On the left, the wound is about the same size, but there is more undermining present underneath periwound callus. 08/13/2021: The right sided wound looks about the same, but on the left there has been substantial deterioration. The undermining continues to extend under periwound callus. Once this was removed, substantial extension of the wound was present. There is no odor or purulent drainage but clearly the wounds have broken down. 08/20/2021: The wounds look about the same today. He has been out of his total contact cast and has just been changing the dressings using topical Keystone with PolyMem Ag, Kerlix and Ace bandages. The wound on the top of his right ankle has reopened but this is quite small. There was a little bit of purulent material that I expressed when examining this wound. 08/24/2021: After the aggressive debridement I performed at his last visit, the wounds actually look a little bit better today. They are smaller with the exception of the wound on the top of his right ankle which is a little bit bigger as some more skin pulled off when he was changing  his dressing. We are using topical Keystone with PolyMem Ag Kerlix and Ace bandages. 09/02/2021: There has been really no change to any of his wounds. 09/16/2021: The patient was hospitalized last week with nausea, vomiting, and dehydration. He says that while he was in the hospital, his wounds were not really addressed properly. T oday, both plantar foot wounds are larger and the periwound skin is macerated. The wound on the dorsum of his right foot has a scab on the top. The right foot now has a crater where previously he had had nonviable fat. It looks as though this simply died and fell out. The periwound callus is wet. 09/24/2021: His wounds have deteriorated somewhat since his last visit. The wound on the dorsum of his right foot near his ankle is larger and has more nonviable tissue present. The crater in his right foot is even deeper; I cannot quite palpate or probe to bone but I am sure it is close. The wound on his left plantar foot has an odd boggy area in the center that almost feels as though it has fluid within it. He has run out of his topical Keystone antibiotic. We are using silver alginate on his wounds. 09/29/2021: He has developed a new wound on the dorsum of his left foot near his ankle. He says he thinks his wrapping is rubbing in that site. I would concur with this as the wound on his right ankle is larger. The left foot looks about the same. The right foot has the crater that was present last week. No significant slough accumulation, but his foot remains quite swollen and warm despite oral antibiotic therapy. 10/08/2021: All of his wounds look about the same as last week. He did not start his oral antibiotics that are prescribed until just a couple of days ago; his [employer] compounded antibiotics formula has been changed and he is awaiting delivery of the new recipe. His MRI that was scheduled for earlier this week was canceled as no prior authorization had been obtained;  unfortunately the tech responsible sent an email to my old [place] email,  which I no longer use nor have access to. 10/18/2021: The wounds on his bilateral dorsal feet near the ankles are both improved. They are smaller and have just some eschar and slough buildup. The left plantar wound has a fair amount of undermining, but the surface is clean. There is some periwound callus accumulation. On the right plantar foot, there is nonviable fat leading to a deep tunnel that tracks towards his dorsal medial foot. There is periwound callus and slough accumulation, as well. His right foot and leg remain swollen as compared to the left. 10/25/2021: The wounds on his bilateral dorsal feet and at the ankles have broken down somewhat. They are little bit larger than last week. The left plantar wound continues to undermine laterally but the surface is clean. The right plantar foot wound shows some decreased depth in the tunnel tracking towards his dorsal medial foot. He has not yet had the Doppler study that I ordered; it sounds like there is some confusion about the scheduling of the procedure. In addition, the MRI was denied and I have taken steps to appeal the denial. 11/24/2021: Since our last visit, Mr. [name] was admitted to the hospital where an MRI suggested osteomyelitis. He was taken the operating room by podiatry. Bone biopsies were negative for osteomyelitis. They debrided his wounds and applied myriad matrix. He saw them last week and they removed his staples. He is here today to continue his wound healing process. T oday, both of the dorsal foot/ankle wounds are substantially smaller. There is just a little eschar overlying the left sided wound and some eschar and slough on the right. The right plantar foot ulcer has the healthiest surface of granulation tissue that I have seen to date. A portion of the myriad matrix failed to take and was hanging loose. It appears that myriad morcells were placed  into the tunnel closest to the dorsal portion of his foot. These have sloughed off. The left plantar foot ulcer is about the same size, but has a much healthier surface than in the past. Both plantar ulcers have callus and slough accumulation. 12/07/2021: Left dorsal foot/ankle wound is closed. The right dorsal foot/ankle wound is nearly closed and just has a small open area with some eschar and slough. The right plantar foot wound has contracted quite a bit since our last visit. It has a healthy surface with just a little bit of slough accumulation and periwound callus buildup. The left plantar foot wound is about the same size but the surface appears healthy. There is a little slough and periwound callus on this side, as well. 12/15/2021: Both dorsal foot/ankle wounds are closed. The right plantar foot wound is substantially smaller than at our last visit. The tunneling that was present has nearly closed. There is just a little bit of slough buildup. The left plantar foot wound is also a little bit smaller today. The surface is the healthiest that I have ever seen it. Light slough and periwound callus accumulation on this side. 12/22/2021: The dorsal ankle/foot wounds remain closed. The right plantar foot wound continues to contract. There is still a bit of depth at the lateral portion of the wound but the surface has a good granulated appearance. The wound on the left is about the same size, the central indented portion is still adherent. It also is clean with good granulation tissue present. The periwound skin and callus are a bit macerated, however. 12/29/2021: Both ankle wounds remain closed. The right plantar foot  wound is less than half the size that it was last week. There is no depth any longer and the [place], [name] ([id number]) [id number] Page 18 of 22 surface has just a little bit of slough and periwound callus. On the left, the wound is not much smaller, but the  surface is healthier with good granulation tissue. There is also a little slough and periwound callus buildup. 01/05/2022: The right plantar foot wound continues to contract and is once again about half the size as it was last week. There is just a little bit of surface slough and periwound callus. On the left, the depth of the wound has decreased and the diameter has started to contract. It is also very clean with just a little slough and biofilm present. 01/12/2022: The right plantar foot wound is smaller again today. There is just some periwound callus and slough accumulation. On the left, there is a fair amount of undermining and the surface is a little boggy, but no other significant change. 01/19/2022: The right plantar wound continues to contract. There is minimal periwound callus accumulation and just a light layer of slough on the surface. On the left, the surface looks better, but there is fairly significant undermining near the 11:00 portion of the wound, aiming towards his great toe. The skin overlying this area is very healthy and has a good fat layer present. No malodor or purulent drainage. 01/26/2022: The right sided wound continues to contract and has just a light layer of slough on the surface. Minimal periwound callus. On the left, he still has substantial undermining and the tissue surface is not as robust as I would like to see. No malodor or purulent drainage, but he did say he had a lot of serous drainage over the weekend. 02/02/2022: The right foot wound is about a quarter of the size that it was at his last visit. There is just a little slough on the surface and some periwound callus. On the left, the undermining persists and the tissue is still more pale, but he does not seem to have had as much drainage over the last weekend. We are waiting on a wound VAC. Patient History Information obtained from Patient. Family History No family history of Cancer, Diabetes, Hereditary  Spherocytosis, Hypertension, Kidney Disease, Lung Disease, Seizures, Stroke, Thyroid Problems, Tuberculosis. Social History Never smoker, Marital Status - Single, Alcohol Use - Rarely, Drug Use - No History, Caffeine Use - Never. Medical History Eyes Denies history of Cataracts, Glaucoma, Optic Neuritis Ear/Nose/Mouth/Throat Denies history of Chronic sinus problems/congestion, Middle ear problems Hematologic/Lymphatic Denies history of Anemia, Hemophilia, Human Immunodeficiency Virus, Lymphedema, Sickle Cell Disease Respiratory Denies history of Aspiration, Asthma, Chronic Obstructive Pulmonary Disease (COPD), Pneumothorax, Sleep Apnea, Tuberculosis Cardiovascular Denies history of Angina, Arrhythmia, Congestive Heart Failure, Coronary Artery Disease, Deep Vein Thrombosis, Hypertension, Hypotension, Myocardial Infarction, Peripheral Arterial Disease, Peripheral Venous Disease, Phlebitis, Vasculitis Gastrointestinal Denies history of Cirrhosis , Colitis, Crohnoos, Hepatitis A, Hepatitis B, Hepatitis C Endocrine Patient has history of Type II Diabetes Denies history of Type I Diabetes Immunological Denies history of Lupus Erythematosus, Raynaudoos, Scleroderma Integumentary (Skin) Denies history of History of Burn Musculoskeletal Denies history of Gout, Rheumatoid Arthritis, Osteoarthritis, Osteomyelitis Neurologic Denies history of Dementia, Neuropathy, Quadriplegia, Paraplegia, Seizure Disorder Oncologic Denies history of Received Chemotherapy, Received Radiation Psychiatric Denies history of Anorexia/bulimia, Confinement Anxiety Hospitalization/Surgery History - 11/1-11/06/2018- sepsis foot infection. - 11/4-11/5 02 sats low respiratory distress. Objective Constitutional Slightly hypertensive. No acute distress.. Vitals Time Taken: 8:23 AM, Height:  77 in, Weight: 280 lbs, BMI: 33.2, Temperature: 98.1 F, Pulse: 87 bpm, Respiratory Rate: 18 breaths/min, Blood Pressure: 141/93  mmHg, Capillary Blood Glucose: 91 mg/dl. Respiratory Normal work of breathing on room air.. General Notes: 02/02/2022: The right foot wound is about a quarter of the size that it was at his last visit. There is just a little slough on the surface and some periwound callus. On the left, the undermining persists and the tissue is still more pale, but he does not seem to have had as much drainage over the last weekend. [name], [name] ([id number]) [id number] Page 19 of 22 Integumentary (Hair, Skin) Wound #3 status is Open. Original cause of wound was Trauma. The date acquired was: 10/02/2019. The wound has been in treatment 118 weeks. The wound is located on the [place]. The wound measures 3.2cm length x 3.9cm width x 0.5cm depth; 9.802cm^2 area and 4.901cm^3 volume. There is Fat Layer (Subcutaneous Tissue) exposed. Tunneling has been noted at 3:00 with a maximum distance of 1.6cm. Undermining begins at 3:00 and ends at 9:00 with a maximum distance of 1.5cm. There is a medium amount of serosanguineous drainage noted. The wound margin is well defined and not attached to the wound base. There is large (67-100%) red, hyper - granulation within the wound bed. There is a small (1-33%) amount of necrotic tissue within the wound bed including Adherent Slough. The periwound skin appearance had no abnormalities noted for moisture. The periwound skin appearance had no abnormalities noted for color. The periwound skin appearance exhibited: Callus. Periwound temperature was noted as No Abnormality. Wound #8 status is Open. Original cause of wound was Blister. The date acquired was: 04/18/2021. The wound has been in treatment 41 weeks. The wound is located on the Right,Lateral Foot. The wound measures 0.5cm length x 0.6cm width x 0.1cm depth; 0.236cm^2 area and 0.024cm^3 volume. There is Fat Layer (Subcutaneous Tissue) exposed. There is no tunneling or undermining noted.  There is a medium amount of serosanguineous drainage noted. The wound margin is well defined and not attached to the wound base. There is large (67-100%) red, pink granulation within the wound bed. There is a small (1-33%) amount of necrotic tissue within the wound bed including Adherent Slough. The periwound skin appearance had no abnormalities noted for moisture. The periwound skin appearance had no abnormalities noted for color. The periwound skin appearance exhibited: Callus. Periwound temperature was noted as No Abnormality. 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 There was a Selective/Open Wound Skin/Epidermis Debridement with a total area of 12.48 sq cm performed by [name] Maudlin, MD. With the following instrument(s): Curette to remove Non-Viable tissue/material. Material removed includes Callus, Slough, and Skin: Epidermis. No specimens were taken. A time out was conducted at 08:43, prior to the start of the procedure. A Minimum amount of bleeding was controlled with Pressure. The procedure was tolerated well. Post Debridement Measurements: 3.2cm length x 3.9cm width x 0.5cm depth; 4.901cm^3 volume. Character of Wound/Ulcer Post Debridement requires further debridement. Severity of Tissue Post Debridement is: Fat layer exposed. Post procedure Diagnosis Wound #3: Same as Pre-Procedure General Notes: Scribed for Dr. Celine [name] by [name] East,  RN. Wound #8 Pre-procedure diagnosis of Wound #8 is a Diabetic Wound/Ulcer of the Lower Extremity located on the Right,Lateral Foot .Severity of  Tissue Pre Debridement is: Fat layer exposed. There was a Selective/Open Wound Skin/Epidermis Debridement with a total area of 0.3 sq cm performed by [name] Maudlin, MD. With the following instrument(s): Curette to remove Non-Viable tissue/material. Material removed includes Callus, Slough, and Skin: Epidermis. No specimens were taken. A time out was conducted at 08:43, prior to the start of the procedure. A Minimum amount of bleeding was controlled with Pressure. The procedure was tolerated well. Post Debridement Measurements: 0.5cm length x 0.6cm width x 0.1cm depth; 0.024cm^3 volume. Character of Wound/Ulcer Post Debridement requires further debridement. Severity of Tissue Post Debridement is: Fat layer exposed. Post procedure Diagnosis Wound #8: Same as Pre-Procedure General Notes: Scribed for Dr. Celine [name] by [name] East, RN. Plan Follow-up Appointments: Return Appointment in 1 week. - Dr. Celine [name] - Room 1 with [facility] Shower/ Hygiene: May shower and wash wound with soap and water. Edema Control - Lymphedema / SCD / Other: Elevate legs to the level of the heart or above for 30 minutes daily and/or when sitting, a frequency of: - throughout the day Avoid standing for long periods of time. Exercise regularly Compression stocking or Garment 20-30 mm/Hg pressure to: - to both legs daily Off-Loading: Open toe surgical shoe to: - Both feet Additional Orders / Instructions: Follow Nutritious Diet WOUND #3: - Foot Wound Laterality: Plantar, Left, Lateral Cleanser: Soap and Water 1 x Per Day/30 Days Discharge Instructions: May shower and wash wound with dial antibacterial soap and water prior to dressing change. Peri-Wound Care: Zinc Oxide Ointment 30g tube 1 x Per Day/30 Days Discharge Instructions: Apply Zinc Oxide to periwound with each dressing change Peri-Wound Care: Sween Lotion (Moisturizing lotion) 1 x Per Day/30 Days Discharge Instructions: Apply moisturizing lotion as  directed Prim Dressing: Hydrofera Blue Ready Foam, 4x5 in (Generic) 1 x Per Day/30 Days ary Discharge Instructions: Apply to wound bed as instructed Secondary Dressing: ABD Pad, 8x10 (Generic) 1 x Per Day/30 Days Discharge Instructions: Apply over primary dressing as directed. Secondary Dressing: Optifoam Non-Adhesive Dressing, 4x4 in (Generic) 1 x Per Day/30 Days Discharge Instructions: Apply over primary dressing as directed. Satterfield, [name] ([id number]) [id number] Page 20 of 22 Secondary Dressing: Woven Gauze Sponge, Non-Sterile 4x4 in (Generic) 1 x Per Day/30 Days Discharge Instructions: Apply over primary dressing as directed. Secondary Dressing: Zetuvit Absorbent Pad, 4x4 (in/in) (Dispense As Written) 1 x Per Day/30 Days Secured With: 37M Medipore H Soft Cloth Surgical T ape, 4 x 10 (in/yd) (Dispense As Written) 1 x Per Day/30 Days Discharge Instructions: Secure with tape as directed. Com pression Wrap: Kerlix Roll 4.5x3.1 (in/yd) (Generic) 1 x Per Day/30 Days Discharge Instructions: Apply Kerlix and Coban compression as directed. Com pression Wrap: 37M ACE Elastic Bandage With VELCRO Brand Closure, 4 (in) (Generic) 1 x Per Day/30 Days WOUND #8: - Foot Wound Laterality: Right, Lateral Cleanser: Soap and Water 1 x Per Day/30 Days Discharge Instructions: May shower and wash wound with dial antibacterial soap and water prior to dressing change. Peri-Wound Care: Sween Lotion (Moisturizing lotion) 1 x Per Day/30 Days Discharge Instructions: Apply moisturizing lotion as directed Prim Dressing: Promogran Prisma Matrix, 4.34 (sq in) (silver collagen) (Generic) 1 x Per Day/30 Days ary Discharge Instructions: Moisten collagen with saline or hydrogel Secondary Dressing: ABD Pad, 8x10 (Generic) 1 x Per Day/30 Days Discharge Instructions: Apply over primary dressing as directed. Secondary Dressing: Optifoam  Non-Adhesive Dressing, 4x4 in (Generic) 1 x Per Day/30  Days Discharge Instructions: Apply over primary dressing as directed. Secondary Dressing: Woven Gauze Sponge, Non-Sterile 4x4 in (Generic) 1 x Per Day/30 Days Discharge Instructions: Apply over primary dressing as directed. Secondary Dressing: Zetuvit Absorbent Pad, 4x4 (in/in) (Dispense As Written) 1 x Per Day/30 Days Secured With: 35M Medipore H Soft Cloth Surgical T ape, 4 x 10 (in/yd) (Dispense As Written) 1 x Per Day/30 Days Discharge Instructions: Secure with tape as directed. Com pression Wrap: Kerlix Roll 4.5x3.1 (in/yd) (Generic) 1 x Per Day/30 Days Discharge Instructions: Apply Kerlix and Coban compression as directed. Com pression Wrap: 35M ACE Elastic Bandage With VELCRO Brand Closure, 4 (in) (Generic) 1 x Per Day/30 Days 02/02/2022: The right foot wound is about a quarter of the size that it was at his last visit. There is just a little slough on the surface and some periwound callus. On the left, the undermining persists and the tissue is still more pale, but he does not seem to have had as much drainage over the last weekend. I used a curette to debride slough and periwound callus from both wounds. We will continue to use Prisma silver collagen on the right and Hydrofera Blue on the left. The patient says that he is talking the Hydrofera Blue up into the undermined portion of his wound. Hopefully, he will have a wound VAC delivered for [name] to apply next week. I will see him then. Electronic Signature(s) Signed: 02/02/2022 8:56:08 AM By: [name] Maudlin MD FACS Entered By: [name] [name] on 02/02/2022 08:56:07 -------------------------------------------------------------------------------- HxROS Details Patient Name: Date of Service: [name] [name], [name] 02/02/2022 8:15 A M Medical Record Number: [id number] Patient Account Number: [id number] Date of Birth/Sex: Treating RN: [birth year] (36 y.o. M) Primary Care Provider: Seward [name] Other Clinician: Referring Provider: Treating  Provider/Extender: [name] [name] in Treatment: 8 Information Obtained From Patient Eyes Medical History: Negative for: Cataracts; Glaucoma; Optic Neuritis Ear/Nose/Mouth/Throat Medical History: Negative for: Chronic sinus problems/congestion; Middle ear problems Hematologic/Lymphatic Medical History: Negative for: Anemia; Hemophilia; Human Immunodeficiency Virus; Lymphedema; Sickle Cell Disease Respiratory Medical History: Billman, [name] ([id number]) [id number] Page 21 of 22 Negative for: Aspiration; Asthma; Chronic Obstructive Pulmonary Disease (COPD); Pneumothorax; Sleep Apnea; Tuberculosis Cardiovascular Medical History: Negative for: Angina; Arrhythmia; Congestive Heart Failure; Coronary Artery Disease; Deep Vein Thrombosis; Hypertension; Hypotension; Myocardial Infarction; Peripheral Arterial Disease; Peripheral Venous Disease; Phlebitis; Vasculitis Gastrointestinal Medical History: Negative for: Cirrhosis ; Colitis; Crohns; Hepatitis A; Hepatitis B; Hepatitis C Endocrine Medical History: Positive for: Type II Diabetes Negative for: Type I Diabetes Time with diabetes: 8 Treated with: Insulin Blood sugar tested every day: No Immunological Medical History: Negative for: Lupus Erythematosus; Raynauds; Scleroderma Integumentary (Skin) Medical History: Negative for: History of Burn Musculoskeletal Medical History: Negative for: Gout; Rheumatoid Arthritis; Osteoarthritis; Osteomyelitis Neurologic Medical History: Negative for: Dementia; Neuropathy; Quadriplegia; Paraplegia; Seizure Disorder Oncologic Medical History: Negative for: Received Chemotherapy; Received Radiation Psychiatric Medical History: Negative for: Anorexia/bulimia; Confinement Anxiety Immunizations Pneumococcal Vaccine: Received Pneumococcal Vaccination: No Implantable Devices None Hospitalization / Surgery History Type of  Hospitalization/Surgery 11/1-11/06/2018- sepsis foot infection 11/4-11/5 02 sats low respiratory distress Family and Social History Cancer: No; Diabetes: No; Hereditary Spherocytosis: No; Hypertension: No; Kidney Disease: No; Lung Disease: No; Seizures: No; Stroke: No; Thyroid Problems: No; Tuberculosis: No; Never smoker; Marital Status - Single; Alcohol Use: Rarely; Drug Use: No History; Caffeine Use: Never; Financial Concerns: No; Food, Clothing or Shelter Needs: No; Support System Lacking: No; Transportation Concerns: No  Electronic Signature(s) Signed: 02/02/2022 9:00:16 AM By: [name] Maudlin MD FACS Entered By: [name] [name] on 02/02/2022 08:51:47 [name], [name] ([id number]) [id number] Page 22 of 22 -------------------------------------------------------------------------------- SuperBill Details Patient Name: Date of Service: [name] [name] 02/02/2022 Medical Record Number: [id number] Patient Account Number: [id number] Date of Birth/Sex: Treating RN: 04/30/1985 (36 y.o. M) Primary Care Provider: Seward [name] Other Clinician: Referring Provider: Treating Provider/Extender: [name] [name] in Treatment: 118 Diagnosis Coding ICD-10 Codes Code Description E11.621 Type 2 diabetes mellitus with foot ulcer L97.528 Non-pressure chronic ulcer of other part of left foot with other specified severity L97.518 Non-pressure chronic ulcer of other part of right foot with other specified severity Facility Procedures : CPT4 Code: [id number] Description: 22575 - DEBRIDE WOUND 1ST 20 SQ CM OR < ICD-10 Diagnosis Description L97.528 Non-pressure chronic ulcer of other part of left foot with other specified severi L97.518 Non-pressure chronic ulcer of other part of right foot with other  specified sever Modifier: ty ity Quantity: 1 Physician Procedures : CPT4 Code Description Modifier [id number] - WC PHYS LEVEL 3 - EST PT 25 ICD-10  Diagnosis Description L97.528 Non-pressure chronic ulcer of other part of left foot with other specified severity L97.518 Non-pressure chronic ulcer of other part of  right foot with other specified severity E11.621 Type 2 diabetes mellitus with foot ulcer Quantity: 1 : [id number] - WC PHYS DEBR WO ANESTH 20 SQ CM ICD-10 Diagnosis Description L97.528 Non-pressure chronic ulcer of other part of left foot with other specified severity L97.518 Non-pressure chronic ulcer of other part of right foot with other specified  severity Quantity: 1 Electronic Signature(s) Signed: 02/02/2022 8:56:29 AM By: [name] Maudlin MD FACS Entered By: [name] [name] on 02/02/2022 88:67:73

## 2022-02-09 ENCOUNTER — Encounter (HOSPITAL_BASED_OUTPATIENT_CLINIC_OR_DEPARTMENT_OTHER): Payer: BC Managed Care – PPO | Admitting: General Surgery

## 2022-02-09 DIAGNOSIS — E11621 Type 2 diabetes mellitus with foot ulcer: Secondary | ICD-10-CM | POA: Diagnosis not present

## 2022-02-09 NOTE — Progress Notes (Signed)
[name], [name] ([id number]) [id number] Page 1 of 21 Visit Report for 02/09/2022 Chief Complaint Document Details Patient Name: Date of Service: [name] 02/09/2022 8:15 A M Medical Record Number: [id number] Patient Account Number: [id number] Date of Birth/Sex: Treating RN: Dec 10, 1985 (36 y.o. M) Primary Care Provider: [name] Other Clinician: Referring Provider: Treating Provider/Extender: [name] in Treatment: [place] from: Patient Chief Complaint 01/11/2019; patient is here for review of a rather substantial wound over the left fifth plantar metatarsal head extending into the lateral part of his foot 10/24/2019; patient returns to clinic with wounds on his bilateral feet with underlying osteomyelitis biopsy-proven Electronic Signature(s) Signed: 02/09/2022 9:52:27 AM By: [name] MD FACS Entered By: [name] on 02/09/2022 09:52:27 -------------------------------------------------------------------------------- Debridement Details Patient Name: Date of Service: [name], [name] 02/09/2022 8:15 A M Medical Record Number: [id number] Patient Account Number: [id number] Date of Birth/Sex: Treating RN: June 30, 1985 (36 y.o. [name] Primary Care Provider: [name] Other Clinician: Referring Provider: Treating Provider/Extender: [name] in Treatment: 119 Debridement Performed for Assessment: Wound #3 Left,Lateral,Plantar Foot Performed By: Physician [name], MD Debridement Type: Debridement Severity of Tissue Pre Debridement: Fat layer exposed Level of Consciousness (Pre-procedure): Awake and Alert Pre-procedure Verification/Time Out Yes - 09:05 Taken: Start Time: 09:10 Pain Control: Lidocaine 4% T opical Solution T Area Debrided (L x W): otal 3.8 (cm) x 1 (cm) = 3.8 (cm) Tissue and other material debrided: Non-Viable, Callus,  Skin: Epidermis Level: Skin/Epidermis Debridement Description: Selective/Open Wound Instrument: Curette Bleeding: None Procedural Pain: 0 Post Procedural Pain: 0 Response to Treatment: Procedure was tolerated well Level of Consciousness (Post- Awake and Alert procedure): Post Debridement Measurements of Total Wound Length: (cm) 3.8 Width: (cm) 3.5 Depth: (cm) 1.1 Volume: (cm) 11.49 Character of Wound/Ulcer Post Debridement: Improved Severity of Tissue Post Debridement: Fat layer exposed [name], [name] ([id number]) [id number] Page 2 of 21 Post Procedure Diagnosis Same as Pre-procedure Notes scribed by Baruch Gouty, RN for Dr. [name] Electronic Signature(s) Signed: 02/09/2022 12:55:38 PM By: [name] MD FACS Signed: 02/09/2022 4:59:38 PM By: Baruch Gouty RN, BSN Entered By: Baruch Gouty on 02/09/2022 09:16:39 -------------------------------------------------------------------------------- HPI Details Patient Name: Date of Service: [name], [name] 02/09/2022 8:15 A M Medical Record Number: [id number] Patient Account Number: [id number] Date of Birth/Sex: Treating RN: 1986/03/18 (36 y.o. 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 He comes in for review of a wound over the plantar fifth metatarsal head extending into the lateral part of the foot. He was followed for this previously by his podiatrist Dr. Cornelius Moras. As the patient tells his story he went to see podiatry first for a swelling he developed on the lateral part of his fifth metatarsal head in May. He states this was "open" by podiatry and the area closed. He was followed up in June and it was again opened callus  removed and it closed promptly. There were plans being made for surgery on the fifth metatarsal head in June however his blood sugar was apparently too high for anesthesia. Apparently the area was debrided and opened again in June and it is never closed since. Looking over the records from podiatry I am really not able to follow this. It was clear when he was  first seen it was before 5/14 at that point he already had a wound. By 5/17 the ulcer was resolved. I do not see anything about a procedure. On 5/28 noted to have pre-ulcerative moderate keratosis. X-ray noted 1/5 contracted toe and tailor's bunion and metatarsal deformity. On a visit date on 09/28/2018 the dorsal part of the left foot it healed and resolved. There was concern about swelling in his lower extremity he was sent to the ER.. As far as I can tell he was seen in the ER on 7/12 with an ulcer on his left foot. A DVT rule out of the left leg was negative. I do not think I have complete records from podiatry but I am not able to verify the procedures this patient states he had. He states after the last procedure the wound has never closed although I am not able to follow this in the records I have from podiatry. He has not had a recent x-ray The patient has been using Neosporin on the wound. He is wearing a Darco shoe. He is still very active up on his foot working and exercising. Past medical history; type 2 diabetes ketosis-prone, leg swelling with a negative DVT study in July. Non-smoker ABI in our clinic was 0.85 on the left 10/16; substantial wound on the plantar left fifth met head extending laterally almost to the dorsal fifth MTP. We have been using silver alginate we gave him a Darco forefoot off loader. An x-ray did not show evidence of osteomyelitis did note soft tissue emphysema which I think was due to gas tracking through an open wound. There is no doubt in my mind he requires an MRI 10/23; MRI not booked until 3 November at  the earliest this is largely due to his glucose sensor in the right arm. We have been using silver alginate. There has been an improvement 10/29; I am still not exactly sure when his MRI is booked for. He says it is the third but it is the 10th in epic. This definitely needs to be done. He is running a low-grade fever today but no other symptoms. No real improvement in the 1 02/26/2019 patient presents today for a follow-up visit here in our clinic he is last been seen in the clinic on October 29. Subsequently we were working on getting MRI to evaluate and see what exactly was going on and where we would need to go from the standpoint of whether or not he had osteomyelitis and again what treatments were going be required. Subsequently the patient ended up being admitted to the hospital on 02/07/2019 and was discharged on 02/14/2019. This is a somewhat interesting admission with a discharge diagnosis of pneumonia due to COVID-19 although he was positive for COVID-19 when tested at the urgent care but negative x2 when he was actually in the hospital. With that being said he did have acute respiratory failure with hypoxia and it was noted he also have a left foot ulceration with osteomyelitis. With that being said he did require oxygen for his pneumonia and I level 4 L. He was placed on antivirals and steroids for the COVID-19. He was also transferred to the [facility] at one point. Nonetheless he did subsequently discharged home and since being home has done much better in that regard. The CT angiogram did not show any pulmonary embolism. With regard to the osteomyelitis the patient was placed on vancomycin and Zosyn while in the hospital but has been changed to  Augmentin at discharge. It was also recommended that he follow- up with wound care and podiatry. Podiatry however wanted him to see [name] according to the patient prior to them doing anything further. His hemoglobin A1c was 9.9 as noted in  the hospital. Have an MRI of the left foot performed while in the hospital on 02/04/2019. 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 There is an overlying plantar open wound noted an abscess tracking back along the lateral aspect of the fifth metatarsal shaft. There is otherwise diffuse cellulitis and mild fasciitis without findings of polymyositis. The patient did have recently pneumonia secondary to COVID-19 I looked in the chart through epic and it does appear that the patient may need to have an additional x-ray just to ensure everything is cleared and that he has no airspace disease prior to putting him into the [name]. 03/05/2019; patient was readmitted to the clinic last week. He was hospitalized twice for a viral upper respiratory tract infection from 11/1 through 11/4 and then 11/5 through 11/12 ultimately this turned out to be Covid pneumonitis. Although he was discharged on oxygen he is not using it. He says he feels fine. He has no exercise limitation no cough no sputum. His O2 sat in our clinic today was 100% on room air. 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 He received Vanco and Zosyn in the hospital and then was discharged on 2 weeks of Augmentin. I do not see any relevant cultures. He was supposed to Blunt, [name] ([id number]) [id number] Page 3 of 21 follow-up with infectious disease but I do not see that he has an appointment. 12/8; patient saw Dr. Novella Olive of infectious disease last week. He felt that he had had adequate antibiotic therapy. He did not go to follow-up with Dr. Amalia Hailey of podiatry and I have again talked to him about the pros and cons of this. He does not want to consider a ray amputation of this time. He is aware of the risks of  recurrence, migration etc. He started HBO today and tolerated this well. He can complete the Augmentin that I gave him last week. I have looked over the lab work that Dr. Chana Bode ordered his C-reactive protein was 3.3 and his sedimentation rate was 17. The C-reactive protein is never really been measurably that high in this patient 12/15; not much change in the wound today however he has undermining along the lateral part of the foot again more extensively than last week. He has some rims of epithelialization. We have been using silver alginate. He is undergoing hyperbarics but did not dive today 12/18; in for his obligatory first total contact cast change. Unfortunately there was pus coming from the undermining area around his fifth metatarsal head. This was cultured but will preclude reapplication of a cast. He is seen in conjunction with HBO 12/24; patient had staph lugdunensis in the wound in the undermining area laterally last time. We put him on doxycycline which should have covered this. The wound looks better today. I am going to give him another week of doxycycline before reattempting the total contact cast 12/31; the patient is completing antibiotics. Hemorrhagic debris in the distal part of the wound with some undermining distally. He also had hyper granulation. Extensive debridement with a #5 curette. The infected area that  was on the lateral part of the fifth met head is closed over. I do not think he needs any more antibiotics. Patient was seen prior to HBO. Preparations for a total contact cast were made in the cast will be placed post hyperbarics 04/11/19; once again the patient arrives today without complaint. He had been in a cast all week noted that he had heavy drainage this week. This resulted in large raised areas of macerated tissue around the wound 1/14; wound bed looks better slightly smaller. Hydrofera Blue has been changing himself. He had a heavy drainage last week which  caused a lot of maceration around the wound so I took him out of a total contact cast he says the drainage is actually better this week He is seen today in conjunction with HBO 1/21; returns to clinic. He was up in [place] for a day or 2 attending a funeral. He comes back in with the wound larger and with a large area of exposed bone. He had osteomyelitis and septic arthritis of the fifth left metatarsal head while he was in hospital. He received IV antibiotics in the hospital for a prolonged period of time then 3 weeks of Augmentin. Subsequently I gave him 2 weeks of doxycycline for more superficial wound infection. When I saw this last week the wound was smaller the surface of the wound looks satisfactory. 1/28; patient missed hyperbarics today. Bone biopsy I did last time showed Enterococcus faecalis and Staphylococcus lugdunensis . He has a wide area of exposed bone. We are going to use silver alginate as of today. I had another ethical discussion with the patient. 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 In this situation I think the likelihood of healing this is low. Therefore I have recommended a ray amputation and with the patient's agreement I have referred him to Dr. Doran Durand. The other issue is that his compliance with hyperbarics has been minimal because of his work schedule and given his underlying decision I am going to stop this today READMISSION 10/24/2019 MRI 09/29/2019 left foot IMPRESSION: 1. Apparent skin ulceration inferior and lateral to the 5th metatarsal base with underlying heterogeneous T2 signal and enhancement in the subcutaneous fat. Small peripherally enhancing fluid collections along the plantar and lateral aspects of the 5th metatarsal base suspicious for abscesses. 2. Interval amputation through the mid 5th metatarsal with nonspecific low-level marrow edema and enhancement. Given the proximity to the adjacent soft tissue  inflammatory changes, osteomyelitis cannot be excluded. 3. The additional bones appear unremarkable. MRI 09/29/2019 right foot IMPRESSION: 1. Soft tissue ulceration lateral to the 5th MTP joint. There is low-level T2 hyperintensity within the 4th and 5th metatarsal heads and adjacent proximal phalanges without abnormal T1 signal or cortical destruction. These findings are nonspecific and could be seen with early marrow edema, hyperemia or early osteomyelitis. No evidence of septic joint. 2. Mild tenosynovitis and synovial enhancement associated with the extensor digitorum tendons at the level of the midfoot. 3. Diffuse low-level muscular T2 hyperintensity and enhancement, most consistent with diabetic myopathy. LEFT FOOT BONE Methicillin resistant staphylococcus aureus Staphylococcus lugdunensis MIC MIC CIPROFLOXACIN >=8 RESISTANT Resistant <=0.5 SENSI... Sensitive CLINDAMYCIN <=0.25 SENS... Sensitive >=8 RESISTANT Resistant ERYTHROMYCIN >=8 RESISTANT Resistant >=8 RESISTANT Resistant GENTAMICIN <=0.5 SENSI... Sensitive <=0.5 SENSI... Sensitive Inducible Clindamycin NEGATIVE Sensitive NEGATIVE Sensitive OXACILLIN >=4 RESISTANT Resistant 2 SENSITIVE Sensitive RIFAMPIN <=0.5 SENSI... Sensitive <=0.5 SENSI... Sensitive TETRACYCLINE <=1 SENSITIVE Sensitive <=1 SENSITIVE Sensitive TRIMETH/SULFA <=10 SENSIT Sensitive <=10 SENSIT Sensitive ... Marland Kitchen..  VANCOMYCIN 1 SENSITIVE Sensitive <=0.5 SENSI... Sensitive Right foot bone . Component 3 wk ago Specimen Description BONE Special Requests RIGHT 4 METATARSAL SAMPLE B Gram Stain NO WBC SEEN NO ORGANISMS SEEN Culture RARE METHICILLIN RESISTANT STAPHYLOCOCCUS AUREUS NO ANAEROBES ISOLATED Performed at [facility], [place] [address], [place], [place] [address] Report Status 10/08/2019 FINAL Organism ID, Bacteria METHICILLIN RESISTANT STAPHYLOCOCCUS AUREUS [name], [name] ([id number]) [id number] Page 4 of  21 Resulting Agency [facility] Susceptibility Methicillin resistant staphylococcus aureus MIC CIPROFLOXACIN >=8 RESISTANT Resistant CLINDAMYCIN <=0.25 SENS... Sensitive ERYTHROMYCIN >=8 RESISTANT Resistant GENTAMICIN <=0.5 SENSI... Sensitive Inducible Clindamycin NEGATIVE Sensitive OXACILLIN >=4 RESISTANT Resistant RIFAMPIN <=0.5 SENSI... Sensitive TETRACYCLINE <=1 SENSITIVE Sensitive TRIMETH/SULFA <=10 SENSIT Sensitive ... VANCOMYCIN 1 SENSITIVE Sensitive This is a patient we had in clinic earlier this year with a wound over his left fifth metatarsal head. 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 Doran Durand on 05/16/2019. He seems to have developed problems with wounds on his bilateral feet in June 2021 although he may have had problems earlier than this. He was in an urgent care with a right foot ulcer on 09/26/2019 and given a course of doxycycline. This was apparently after having trouble getting into see orthopedics. He was seen by podiatry on 09/28/2019 noted to have bilateral lower extremity ulcers including the left lateral fifth metatarsal base and the right subfifth met head. It was noted that had purulent drainage at that time. He required hospitalization from 6/20 through 7/2. This was because of worsening right foot wounds. He underwent bilateral operative incision and drainage and bone biopsies bilaterally. Culture results are listed above. He has been referred back to clinic by Dr. Jacqualyn Posey of podiatry. He is also followed by Dr. Megan Salon who saw him yesterday. He was discharged from hospital on Zyvox Flagyl and Levaquin and yesterday changed to doxycycline Flagyl and Levaquin. His inflammatory markers  on 6/26 showed a sedimentation rate of 129 and a C-reactive protein of 5. This is improved to 14 and 1.3 respectively. This would indicate improvement. ABIs in our clinic today were 1.23 on the right and 1.20 on the left 11/01/2019 on evaluation today patient appears to be doing fairly well in regard to the wounds on his feet at this point. Fortunately there is no signs of active infection at this time. No fevers, chills, nausea, vomiting, or diarrhea. He currently is seeing infectious disease and still under their care at this point. Subsequently he also has both wounds which she has not been using collagen on as he did not receive that in his packaging he did not call us and let us know that. Apparently that just was missed on the order. Nonetheless we will get that straightened out today. 8/9-Patient returns for bilateral foot wounds, using Prisma with hydrogel moistened dressings, and the wounds appear stable. Patient using surgical shoes, avoiding much pressure or weightbearing as much as possible 8/16; patient has bilateral foot wounds. 1 on the right lateral foot proximally the other is on the left mid lateral foot. Both required debridement of callus and thick skin around the wounds. We have been using silver collagen 8/27; patient has bilateral lateral foot wounds. The area on the left substantially surrounded by callus and dry skin. This  was removed from the wound edge. The underlying wound is small. The area on the right measured somewhat smaller today. 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 Unfortunately I did not update his antibiotics during today's visit. 9/10 I reviewed Dr. Hale Bogus last notes he felt he had completed antibiotics his inflammatory markers were reasonably well controlled. He has a small wound on the lateral left foot and a tiny area on the right which is just above closed. He is using Hydrofera Blue  with border foam he has bilateral surgical shoes 9/24; 2 week f/u. doing well. right foot is closed. left foot still undermined. 10/14; right foot remains closed at the fifth met head. The area over the base of the left fifth metatarsal has a small open area but considerable undermining towards the plantar foot. Thick callus skin around this suggests an adequate pressure relief. We have talked about this. He says he is going to go back into his cam boot. I suggested a total contact cast he did not seem enamored with this suggestion 10/26; left foot base of the fifth metatarsal. Same condition as last time. He has skin over the area with an open wound however the skin is not adherent. He went to see Dr. Earleen Newport who did an x-ray and culture of his foot I have not reviewed the x-ray but the patient was not told anything. He is on doxycycline 11/11; since the patient was last here he was in the emergency room on 10/30 he was concerned about swelling in the left foot. They did not do any cultures or x-rays. They changed his antibiotics to cephalexin. Previous culture showed group B strep. The cephalexin is appropriate as doxycycline has less than predictable coverage. Arrives in clinic today with swelling over this area under the wound. He also has a new wound on the right fifth metatarsal head 11/18; the patient has a difficult wound on the lateral aspect of the left fifth metatarsal head. The wound was almost ballotable last week I opened it slightly expecting to see purulence however there was just bleeding. I cultured this this was negative. X-ray unchanged. We are trying to get an MRI but I am not sure were going to be able to get this through his insurance. He also has an area on the right lateral fifth metatarsal head this looks healthier 12/3; the patient finally got our MRI. Surprisingly this did not show osteomyelitis. I did show the soft tissue ulceration at the lateral plantar aspect of the  fifth metatarsal base with a tiny residual 6 mm abscess overlying the superficial fascia I have tried to culture this area I have not been able to get this to grow anything. Nevertheless the protruding tissue looks aggravated. I suspect we should try to treat the underlying "abscess with broad-spectrum antibiotics. I am going to start him on Levaquin and Flagyl. He has much less edema in his legs and I am going to continue to wrap his legs and see him weekly 12/10. I started Levaquin and Flagyl on him last week. He just picked up the Flagyl apparently there was some delay. The worry is the wound on the left fifth metatarsal base which is substantial and worsening. His foot looks like he inverts at the ankle making this a weightbearing surface. Certainly no improvement in fact I think the measurements of this are somewhat worse. We have been using 12/17; he apparently just got the Levaquin  yesterday this is 2 weeks after the fact. He has completed the Flagyl. The area over the left fifth metatarsal base still has protruding granulation tissue although it does not look quite as bad as it did some weeks ago. He has severe bilateral lymphedema although we have not been treating him for wounds on his legs this is definitely going to require compression. There was so much edema in the left I did not wish to put him in a total contact cast today. I am going to increase his compression from 3-4 layer. The area on the right lateral fifth met head actually look quite good and superficial. 12/23; patient arrived with callus on the right fifth met head and the substantial hyper granulated callused wound on the base of his fifth metatarsal. He says he is completing his Levaquin in 2 days but I do not think that adds up with what I gave him but I will have to double check this. We are using Hydrofera Blue on both areas. My plan is to put the left leg in a cast the week after New Year's 04/06/2020; patient's wounds  about the same. Right lateral fifth metatarsal head and left lateral foot over the base of the fifth metatarsal. There is undermining on the left lateral foot which I removed before application of total contact cast continuing with Hydrofera Blue new. Patient tells me he was seen by endocrinology today lab work was done [Dr. Kerr]. Also wondering whether he was referred to cardiology. I went over some lab work from previously does not have chronic renal failure certainly not nephrotic range proteinuria he does have very poorly controlled diabetes but this is not his most updated lab work. Hemoglobin A1c has been over 11 1/10; the patient had a considerable amount of leakage towards mid part of his left foot with macerated skin however the wound surface looks better the area on the right lateral fifth met head is better as well. I am going to change the dressing on the left foot under the total contact cast to silver alginate, continue with Hydrofera Blue on the right. 1/20; patient was in the total contact cast for 10 days. Considerable amount of drainage although the skin around the wound does not look too bad on the left foot. The area on the right fifth metatarsal head is closed. Our nursing staff reports large amount of drainage out of the left lateral foot wound 1/25; continues with copious amounts of drainage described by our intake staff. PCR culture I did last week showed E. coli and Enterococcus faecalis and low quantities. Multiple resistance genes documented including extended spectrum beta lactamase, MRSA, MRSE, quinolone, tetracycline. The wound is not quite as good this week as it was 5 days ago but about the same size 2/3; continues with copious amounts of malodorous drainage per our intake nurse. The PCR culture I did 2 weeks ago showed E. coli and low quantities of Enterococcus. There were multiple resistance genes detected. I put Neosporin on him last week although this does not seem  to have helped. The wound is Trinity, [name] ([id number]) [id number] Page 5 of 21 slightly deeper today. Offloading continues to be an issue here although with the amount of drainage she has a total contact cast is just not going to work 2/10; moderate amount of drainage. Patient reports he cannot get his stocking on over the dressing. I told him we have to do that the nurse gave him suggestions on how to make  this work. The wound is on the bottom and lateral part of his left foot. Is cultured predominantly grew low amounts of Enterococcus, E. coli and anaerobes. There were multiple resistance genes detected including extended spectrum beta lactamase, quinolone, tetracycline. I could not think of an easy oral combination to address this so for now I am going to do topical antibiotics provided by [facility] I think the main agents here are vancomycin and an aminoglycoside. We have to be able to give him access to the wounds to get the topical antibiotic on 2/17; moderate amount of drainage this is unchanged. He has his Keystone topical antibiotic against the deep tissue culture organisms. He has been using this and changing the dressing daily. Silver alginate on the wound surface. 2/24; using Keystone antibiotic with silver alginate on the top. He had too much drainage for a total contact cast at one point although I think that is improving and I think in the next week or 2 it might be possible to replace a total contact cast I did not do this today. In general the wound surface looks healthy however he continues to have thick rims of skin and subcutaneous tissue around the wide area of the circumference which I debrided 06/04/2020 upon evaluation today patient appears to be doing well in regard to his wound. I do feel like he is showing signs of improvement. There is little bit of callus and dead tissue around the edges of the wound as well as what appears to be a little bit of  a sinus tract that is off to the side laterally I would perform debridement to clear that away today. 3/17; left lateral foot. The wound looks about the same as I remember. Not much depth surface looks healthy. No evidence of infection 3/25; left lateral foot. Wound surface looks about the same. Separating epithelium from the circumference. There really is no evidence of infection here however not making progress by my view 3/29; left lateral foot. Surface of the wound again looks reasonably healthy still thick skin and subcutaneous tissue around the wound margins. There is no evidence of infection. One of the concerns being brought up by the nurses has again the amount of drainage vis--vis continued use of a total contact cast 4/5; left lateral foot at roughly the base of the fifth metatarsal. Nice healthy looking granulated tissue with rims of epithelialization. The overall wound measurements are not any better but the tissue looks healthy. The only concern is the amount of drainage although he has no surrounding maceration with what we have been doing recently to absorb fluid and protect his skin. He also has lymphedema. 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 He states that the kerlix/coban under the TCC rolled down and could not get it back up. He has been using an offloading scooter and has somehow hurt his right foot using this device. This happened last week. He states that the side of his right foot developed a blister and opened. The top of his foot also has a few small open wounds he thinks is due to his socks rubbing in his shoes. He has not been using any dressings to the wound. He denies purulent drainage, fever/chills or erythema  to the  wounds. 4/22; patient presents for 1 week follow-up. He developed new wounds to the right foot that were evaluated at last clinic visit. He continues to have a total contact cast to the left leg and he reports no issues. He has been using silver collagen to the right foot wounds with no issues. He denies purulent drainage, fever/chills or erythema to the right foot wounds. He has no complaints today 4/25; patient presents for 1 week follow-up. He has a total contact cast of the left leg and reports no issues. He has been using silver alginate to the right foot wound. He denies purulent drainage, fever/chills or erythema to the right foot wounds. 5/2 patient presents for 1 week follow-up. T contact cast on the left. The wound which is on the base of the plantar foot at the base of the fifth metatarsal otal actually looks quite good and dimensions continue to gradually contract. HOWEVER the area on the right lateral fifth metatarsal head is much larger than what I remember from 2 weeks ago. Once more is he has significant levels of hypergranulation. Noteworthy that he had this same hyper granulated response on his wound on the left foot at one point in time. So much so that he I thought there was an underlying fluid collection. Based on this I think this just needs debridement. 5/9; the wound on the left actually continues to be gradually smaller with a healthy surface. Slight amount of drainage and maceration of the skin around but not too bad. However he has a large wound over the right fifth metatarsal head very much in the same configuration as his left foot wound was initially. I used silver nitrate to address the hyper granulated tissue no mechanical debridement 5/16; area on the left foot did not look as healthy this week deeper thick surrounding macerated skin and subcutaneous tissue. The area on the right foot fifth met head was about the same The area on the right ankle that we identified  last week is completely broken down into an open wound presumably a stocking rubbing issue 5/23; patient has been using a total contact cast to the left side. He has been using silver alginate underneath. He has also been using silver alginate to the right foot wounds. He has no complaints today. He denies any signs of infection. 5/31; the left-sided wound looks some better measure smaller surface granulation looks better. We have been using silver alginate under the total contact cast The large area on his right fifth met head and right dorsal foot look about the same still using silver alginate 6/6; neither side is good as I was hoping although the surface area dimensions are better. A lot of maceration on his left and right foot around the wound edge. Area on the dorsal right foot looks better. He says he was traveling. I am not sure what does the amount of maceration around the plantar wounds may be drainage issues 6/13; in general the wound surfaces look quite good on both sides. Macerated skin and raised edges around the wound required debridement although in general especially on the left the surface area seems improved. The area on the right dorsal ankle is about the same I thought this would not be such a problem to close 6/20; not much change in either wound although the one on the right looks a little better. Both wounds have thick macerated edges to the skin requiring debridements. We have been using silver alginate. The  area on the dorsal right ankle is still open I thought this would be closed. 6/28; patient comes in today with a marked deterioration in the right foot wound fifth met head. Wide area of exposed bone this is a drastic change from last time. The area on the left there we have been casting is stagnant. We have been using silver alginate in both wound areas. 7/5; bone culture I did for PCR last time was positive for Pseudomonas, group B strep, Enterococcus and Staph aureus.  There was no suggestion of methicillin resistance or ampicillin resistant genes. This was resistant to tetracycline however He comes into the clinic today with the area over his right plantar fifth metatarsal head which had been doing so well 2 weeks ago completely necrotic feeling bone. I do not know that this is going to be salvageable. The left foot wound is certainly no smaller but it has a better surface and is superficial. 7/8; patient called in this morning to say that his total contact cast was rubbing against his foot. He states he is doing fine overall. He denies signs of infection. 7/12; continued deterioration in the wound over the right fifth metatarsal head crumbling bone. This is not going to be salvageable. The patient agrees and wants to be referred to Dr. Doran Durand which we will attempt to arrange as soon as possible. I am going to continue him on antibiotics as long as that takes so I will renew those today. The area on the left foot which is the base of the fifth metatarsal continues to look somewhat better. Healthy looking tissue no depth no debridement is necessary here. 7/20; the patient was kindly seen by Dr. Doran Durand of orthopedics on 10/19/2020. 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 Towards this end we have taken him out of the total contact cast on the left we will put him in a wrap with Hydrofera Blue. As I understand things surgery is planned for 7/21 7/27; patient had his surgery last Thursday. 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 Apparently everything went well we did not still disturb that today The area on the left foot actually looks quite good. He has been much less mobile which probably explains this he did not seem to do well in the total contact cast secondary to drainage and maceration I think. We have been using Gonzales, [name] ([id number])  [id number] Page 6 of 21 11/09/2020 upon evaluation today patient appears to be doing well with regard to his plantar foot ulcer on the left foot. Fortunately there is no evidence of active infection at this time. No fevers, chills, nausea, vomiting, or diarrhea. Overall I think that he is actually doing extremely well. Nonetheless I do believe that he is staying off of this more following the surgery in his right foot that is the reason the left is doing so great. 8/16; left plantar foot wound. This looks smaller than the last time I saw this he is using Hydrofera Blue. The surgical wound on the right foot is being followed by Dr. Doran Durand we did not look at this today. He has surgical shoes on both feet 8/23; left plantar foot wound not as good this week. Surrounding macerated skin and subcutaneous tissue everything looks moist and wet. I do not think he is offloading this adequately. He is using a surgical shoe Apparently the  right foot surgical wound is not open although I did not check his foot 8/31; left plantar foot lateral aspect. Much improved this week. He has no maceration. Some improvement in the surface area of the wound but most impressively the depth is come in we are using silver alginate. The patient is a Product/process development scientist. He is asked that we write him a letter so he can go back to work. I have also tried to see if we can write something that will allow him to limit the amount of time that he is on his foot at work. 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 Hopefully the latter can be avoided 9/6; I believe the patient missed an appointment last week. He arrives in today with a wound looking roughly the same certainly no better. Undermining laterally and also inferiorly. We used molecuLight today in training with the patient's permission.. We are using silver alginate 9/21  wound is measuring bigger this week although this may have to do with the aggressive circumferential debridement last week in response to the blush fluorescence on the MolecuLight. Culture I did last week showed significant MSSA and E. coli. I put him on Augmentin but he has not started it yet. We are also going to send this for compounded antibiotics at [facility]. There is no evidence of systemic infection 9/29; silver alginate. His Keystone arrived. He is completing Augmentin in 2 days. Offloading in a cam boot. Moderate drainage per our intake staff 10/5; using silver alginate. He has been using his [place]. He has completed his Augmentin. Per our intake nurse still a lot of drainage, far too much to consider a total contact cast. Wound measures about the same. He had the same undermining area that I defined last week from a roughly 11-3. I remove this today 10/12; using silver alginate he is using the [place]. He comes in for a nurse visit hence we are applying [employer] twice a week. Measuring slightly better today and less notable drainage. Extensive debridement of the wound edge last time 10/18; using topical Keystone and silver alginate and a soft cast. Wound measurements about the same. Drainage was through his soft cast. We are changing this twice a week Tuesdays and Friday 10/25; comes in with moderate drainage. Still using Keystone silver alginate and a soft cast. Wound dimensions completely the same.He has a lot of edema in the left leg he has lymphedema. Asking for [name] to consider wrapping him as he cannot get his stocking on over the soft cast 11/2; comes in with moderate to large drainage slightly smaller in terms of width we have been using [place]. His wound looks satisfactory but not much improvement 11/4; patient presents today for obligatory cast change. Has no issues or complaints today. He denies signs of infection. 11/9; patient traveled this weekend to [place], was on the cast  quite a bit. Staining of the cast with black material from his walking boot. Drainage was not quite as bad as we feared. Using silver alginate and Keystone 11/16; we do not have size for cast therefore we have been putting a soft cast on him since the change on Friday. Still a significant amount of drainage necessitating changing twice a week. We have been using the Keystone at cast changes either hard or soft as well as silver alginate Comes in the clinic with things actually looking fairly good improvement in  width. He says his offloading is about the same 02/24/2021 upon evaluation today patient actually comes back in and is doing excellent in regard to his foot ulcer this is significantly smaller even compared to the last visit. The soft cast seems to have done extremely well for him which is great news. I do not see any signs of infection minimal debridement will be needed today. 11/30; left lateral foot much improved half a centimeter improvement in surface area. No evidence of infection. He seems to be doing better with the soft cast in the TCC therefore we will continue with this. He comes back in later in the week for a change with the nurses. This is due to drainage 12/6; no improvement in dimensions. Under illumination some debris on the surface we have been using silver alginate, soft cast. If there is anything optimistic here he seems to have have less drainage 12/13. Dimensions are improved both length and width and slightly in depth. Appears to be quite healthy today. Raised edges of this thick skin and callus around the edges however. He is in a soft cast were bringing him back once for a change on Friday. Drainage is better 12/20. Dimensions are improved. He still has raised edges of thick skin and callus around the edges. We are using a soft cast 12/28; comes in today with thick callus around the wound. Using silver under alginate under a soft cast. I do not think there is much  improvement in any measurement 2023 04/06/2021; patient was put in a total contact cast. Unfortunately not much change in surface area 1/10; not much different still thick callus and skin around the edge in spite of the total contact cast. This was just debrided last week we have been using the [facility] compounded antibiotic and silver alginate under a total contact cast 1/18 the patient's wound on the left side is doing nicely. smaller HOWEVER he comes in today with a wound on the right foot laterally. blister most likely serosangquenous drainage 1/24; the patient continues to do well in terms of the plantar left foot which is continued to contract using silver alginate under the total contact cast HOWEVER the right lateral foot is bigger with denuded skin around the edges. I used pickups and a #15 scalpel to remove this this looks like the remanence of a large blister. Cannot rule out infection. Culture in this area I did last week showed Staphylococcus lugdunensis few colonies. I am going to try to address this with his [employer] antibiotic that is done so well on the left having linezolid and this should cover the staph 2/1; the patient's wound on his left foot which was the original plantar foot wound thick skin and eschar around the edges even in the total contact cast but the wound surface does not look too bad The real problem is on how his right lateral foot at roughly the base of the fifth metatarsal. The wound is completely necrotic more worrisome than that there is swelling around the edges of this. We have been using silver alginate on both wounds and Keystone on the right foot. Unfortunately I think he is going to require systemic antibiotics while we await cultures. He did not get the x-ray done that we ordered last week [lost the prescription 2/7; disappointingly in the area on the left foot which we are treating with a total contact cast is still not closed although it is much smaller.  He continues to have a lot of  callus around the wound edge. -Right lateral foot culture I did last week was negative x-ray also negative for osteomyelitis. 2/15: TCC silver alginate on the left and silver alginate on the right lateral. No real improvement in either area 05/26/2021: T oday, the wounds are roughly the same size as at his previous visit, post-debridement. He continues to endorse fairly substantial drainage, particularly on the right. He has been in a total contact cast on the left. There is still some callus surrounding this lesion. On the right, the periwound skin is quite macerated, along with surrounding callus. The center of the right-sided wound also has some dark, densely adherent material, which is very difficult to remove. 06/02/2021: Today, both wounds are slightly smaller. He has been using zinc oxide ointment around the right ulcer and the degree of maceration has improved markedly. There continues to be an area of nonviable tissue in the center of the right sided ulcer. The left-sided wound, which has been in the total contact Perkins, [name] ([id number]) [id number] Page 7 of 21 cast. Appears clean and the degree of callus around it is less than previously. 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 He had to take the stairs and both wounds have increased in size. The left foot, which has been in a total contact cast, has developed a tunnel tracking to the lateral aspect of his foot. The nonviable tissue in the center of the right-sided ulcer remains recalcitrant to debridement. There is significant undermining surrounding the entirety of the left sided wound. 06/16/2021: The elevator at school has been fixed and the patient has been able to avoid putting as much weight on his wounds over the past week. We converted the left foot wound into a single lesion today, but despite this, the  wound is actually smaller. The base is healthy with limited periwound callus. On the right, the central necrotic area is still present. He continues to be quite macerated around the right sided wound, despite applying barrier cream. This does, however, have the benefit of softening the callus to make it more easily removable. 06/23/2021: Today, the left wound is smaller. The lateral aspect that had opened up previously is now closed. The wound base has a healthy bed of granulation tissue and minimal slough. Unfortunately, on the right, the wound is larger and continues to be fairly macerated. He has also reopened the wound at his right ankle. He thinks this is due to the gait he has adopted secondary to his total contact cast and boot. 06/30/2021: T oday, both wounds are a little bit larger. The lateral aspect on the left has remained closed. He continues to have significant periwound maceration. The culture that I took from the right sided wound grew a population of bacteria that is not covered by his current [facility] antibiotic. The center of the right- sided wound continues to appear necrotic with nonviable fat. It probes deeper today, but does not reach bone. 07/07/2021: The periwound maceration is a little bit less today. The right lateral foot wound has some areas that appear more viable and the necrotic center also looks a little bit better. The wound on the dorsal surface of his right foot near the ankle is contracting and the surface appears healthy. The left plantar wound surface looks healthy, but there is some new undermining on the medial portion. He did get his new Keystone antibiotic and began applying that to the right foot wound  on Saturday. 07/14/2021: The intake nurse reported substantial drainage from his wounds, but the periwound skin actually looks better than is typical for him. The wound on the dorsal surface of his right foot near the ankle is smaller and just has a small open area  underneath some dried eschar. The left plantar wound surface looks healthy and there has been no significant accumulation of callus. The right lateral foot wound looks quite a bit better, with the central portion, which typically appears necrotic, looking more viable albeit pale. 07/22/2021: His left foot is extremely macerated today. The wound is about the same size. The wound on the dorsal surface of his right foot near the ankle had closed, but he traumatized it removing the dressing and there is a tiny skin tear in that location. The right lateral foot wound is bigger, but the surface appears healthy. 07/30/2021: The wound on the dorsal surface of his right foot near the ankle is closed. The right lateral foot wound again is a little bit bigger due to some undermining. The periwound skin is in better condition, however. He has been applying zinc oxide. The wound surface is a little bit dry today. On the left, he does not have the substantial maceration that we frequently see. The wound itself is smaller and has a clean surface. 08/06/2021: Both wounds seem to have deteriorated over the past week. The right lateral foot wound has a dry surface but the periwound is boggy.. Overall wound dimensions are about the same. On the left, the wound is about the same size, but there is more undermining present underneath periwound callus. 08/13/2021: The right sided wound looks about the same, but on the left there has been substantial deterioration. The undermining continues to extend under periwound callus. Once this was removed, substantial extension of the wound was present. There is no odor or purulent drainage but clearly the wounds have broken down. 08/20/2021: The wounds look about the same today. He has been out of his total contact cast and has just been changing the dressings using topical Keystone with PolyMem Ag, Kerlix and Ace bandages. The wound on the top of his right ankle has reopened but this is  quite small. There was a little bit of purulent material that I expressed when examining this wound. 08/24/2021: After the aggressive debridement I performed at his last visit, the wounds actually look a little bit better today. They are smaller with the exception of the wound on the top of his right ankle which is a little bit bigger as some more skin pulled off when he was changing his dressing. We are using topical Keystone with PolyMem Ag Kerlix and Ace bandages. 09/02/2021: There has been really no change to any of his wounds. 09/16/2021: The patient was hospitalized last week with nausea, vomiting, and dehydration. He says that while he was in the hospital, his wounds were not really addressed properly. T oday, both plantar foot wounds are larger and the periwound skin is macerated. The wound on the dorsum of his right foot has a scab on the top. The right foot now has a crater where previously he had had nonviable fat. It looks as though this simply died and fell out. The periwound callus is wet. 09/24/2021: His wounds have deteriorated somewhat since his last visit. The wound on the dorsum of his right foot near his ankle is larger and has more nonviable tissue present. The crater in his right foot is even deeper; I cannot  quite palpate or probe to bone but I am sure it is close. The wound on his left plantar foot has an odd boggy area in the center that almost feels as though it has fluid within it. He has run out of his topical Keystone antibiotic. We are using silver alginate on his wounds. 09/29/2021: He has developed a new wound on the dorsum of his left foot near his ankle. He says he thinks his wrapping is rubbing in that site. I would concur with this as the wound on his right ankle is larger. The left foot looks about the same. The right foot has the crater that was present last week. No significant slough accumulation, but his foot remains quite swollen and warm despite oral antibiotic  therapy. 10/08/2021: All of his wounds look about the same as last week. He did not start his oral antibiotics that are prescribed until just a couple of days ago; his [employer] compounded antibiotics formula has been changed and he is awaiting delivery of the new recipe. His MRI that was scheduled for earlier this week was canceled as no prior authorization had been obtained; unfortunately the tech responsible sent an email to my old [place] email, which I no longer use nor have access to. 10/18/2021: The wounds on his bilateral dorsal feet near the ankles are both improved. They are smaller and have just some eschar and slough buildup. The left plantar wound has a fair amount of undermining, but the surface is clean. There is some periwound callus accumulation. On the right plantar foot, there is nonviable fat leading to a deep tunnel that tracks towards his dorsal medial foot. There is periwound callus and slough accumulation, as well. His right foot and leg remain swollen as compared to the left. 10/25/2021: The wounds on his bilateral dorsal feet and at the ankles have broken down somewhat. They are little bit larger than last week. The left plantar wound continues to undermine laterally but the surface is clean. The right plantar foot wound shows some decreased depth in the tunnel tracking towards his dorsal medial foot. He has not yet had the Doppler study that I ordered; it sounds like there is some confusion about the scheduling of the procedure. In addition, the MRI was denied and I have taken steps to appeal the denial. 11/24/2021: Since our last visit, Mr. Demarais was admitted to the hospital where an MRI suggested osteomyelitis. He was taken the operating room by podiatry. Bone biopsies were negative for osteomyelitis. They debrided his wounds and applied myriad matrix. He saw them last week and they removed his staples. He is here today to continue his wound healing process. T oday, both  of the dorsal foot/ankle wounds are substantially smaller. There is just a little eschar overlying the left sided wound and some eschar and slough on the right. The right plantar foot ulcer has the healthiest surface of granulation tissue that I have seen to date. A portion of the myriad matrix failed to take and was hanging loose. It appears that myriad morcells were placed into the tunnel closest to the dorsal portion of his foot. These have sloughed off. The left plantar foot ulcer is about the same size, but has a much healthier surface than in the past. Both plantar ulcers have callus and slough accumulation. 12/07/2021: Left dorsal foot/ankle wound is closed. The right dorsal foot/ankle wound is nearly closed and just has a small open area with some eschar and slough. The right  plantar foot wound has contracted quite a bit since our last visit. It has a healthy surface with just a little bit of slough accumulation and [name], [name] ([id number]) [phone].pdf Page 8 of 21 periwound callus buildup. The left plantar foot wound is about the same size but the surface appears healthy. There is a little slough and periwound callus on this side, as well. 12/15/2021: Both dorsal foot/ankle wounds are closed. The right plantar foot wound is substantially smaller than at our last visit. The tunneling that was present has nearly closed. There is just a little bit of slough buildup. The left plantar foot wound is also a little bit smaller today. The surface is the healthiest that I have ever seen it. Light slough and periwound callus accumulation on this side. 12/22/2021: The dorsal ankle/foot wounds remain closed. The right plantar foot wound continues to contract. There is still a bit of depth at the lateral portion of the wound but the surface has a good granulated appearance. The wound on the left is about the same size, the central indented portion is still adherent. It also is  clean with good granulation tissue present. The periwound skin and callus are a bit macerated, however. 12/29/2021: Both ankle wounds remain closed. The right plantar foot wound is less than half the size that it was last week. There is no depth any longer and the surface has just a little bit of slough and periwound callus. On the left, the wound is not much smaller, but the surface is healthier with good granulation tissue. There is also a little slough and periwound callus buildup. 01/05/2022: The right plantar foot wound continues to contract and is once again about half the size as it was last week. There is just a little bit of surface slough and periwound callus. On the left, the depth of the wound has decreased and the diameter has started to contract. It is also very clean with just a little slough and biofilm present. 01/12/2022: The right plantar foot wound is smaller again today. There is just some periwound callus and slough accumulation. On the left, there is a fair amount of undermining and the surface is a little boggy, but no other significant change. 01/19/2022: The right plantar wound continues to contract. There is minimal periwound callus accumulation and just a light layer of slough on the surface. On the left, the surface looks better, but there is fairly significant undermining near the 11:00 portion of the wound, aiming towards his great toe. The skin overlying this area is very healthy and has a good fat layer present. No malodor or purulent drainage. 01/26/2022: The right sided wound continues to contract and has just a light layer of slough on the surface. Minimal periwound callus. On the left, he still has substantial undermining and the tissue surface is not as robust as I would like to see. No malodor or purulent drainage, but he did say he had a lot of serous drainage over the weekend. 02/02/2022: The right foot wound is about a quarter of the size that it was at his last  visit. There is just a little slough on the surface and some periwound callus. On the left, the undermining persists and the tissue is still more pale, but he does not seem to have had as much drainage over the last weekend. We are waiting on a wound VAC. 02/09/2022: His right foot has healed. He received the wound VAC, but did not bring it to clinic  with him today. The lateral aspect of the left foot wound has come in a little, but he still has substantial undermining on the medial and distal portion of the wound. Still with macerated periwound callus. Electronic Signature(s) Signed: 02/09/2022 9:59:45 AM By: [name] MD FACS Entered By: [name] on 02/09/2022 09:59:45 -------------------------------------------------------------------------------- Physical Exam Details Patient Name: Date of Service: [name], [name] 02/09/2022 8:15 A M Medical Record Number: [id number] Patient Account Number: [id number] Date of Birth/Sex: Treating RN: 07-12-1985 (36 y.o. M) Primary Care Provider: [name] Other Clinician: Referring Provider: Treating Provider/Extender: [name] in Treatment: 119 Constitutional Slightly hypertensive. . . . No acute distress.Marland Kitchen Respiratory Normal work of breathing on room air.. Notes 02/09/2022: His right foot has healed. The lateral aspect of the left foot wound has come in a little, but he still has substantial undermining on the medial and distal portion of the wound. Still with macerated periwound callus. Electronic Signature(s) Signed: 02/09/2022 10:00:30 AM By: [name] MD FACS Entered By: [name] on 02/09/2022 10:00:30 Montag, [name] ([id number]) [id number] Page 9 of 21 -------------------------------------------------------------------------------- Physician Orders Details Patient Name: Date of Service: [name] 02/09/2022 8:15 A M Medical Record Number:  [id number] Patient Account Number: [id number] Date of Birth/Sex: Treating RN: 09-13-1985 (36 y.o. 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 - Dr. [name] - Room 1 with [name] Return A Nurse Visit: - Friday 11/10 @ 08:15 am RM 1 with [name] for [facility] application Bathing/ [employer] May shower and wash wound with soap and water. Negative Presssure Wound Therapy Wound #3 Left,Lateral,Plantar Foot Wound Vac to wound continuously at 156m/hg pressure - initiate on Friday, Change 2x per week Black Foam Edema Control - Lymphedema / SCD / Other Bilateral Lower Extremities Elevate legs to the level of the heart or above for 30 minutes daily and/or when sitting, a frequency of: - throughout the day Avoid standing for long periods of time. Exercise regularly Compression stocking or Garment 20-30 mm/Hg pressure to: - to both legs daily Off-Loading Open toe surgical shoe to: - Both feet Additional Orders / Instructions Follow Nutritious Diet Non Wound Condition pply the following to affected area as directed: - continue to apply foam donut or cushion to healed right plantar foot A Wound Treatment Wound #3 - Foot Wound Laterality: Plantar, Left, Lateral Cleanser: Soap and Water 1 x Per Day/30 Days Discharge Instructions: May shower and wash wound with dial antibacterial soap and water prior to dressing change. Peri-Wound Care: Zinc Oxide Ointment 30g tube 1 x Per Day/30 Days Discharge Instructions: Apply Zinc  Oxide to periwound with each dressing change Peri-Wound Care: Sween Lotion (Moisturizing lotion) 1 x Per Day/30 Days Discharge Instructions: Apply moisturizing lotion as directed Prim Dressing: Hydrofera Blue Ready Foam, 4x5 in (Generic) 1 x Per Day/30 Days ary Discharge Instructions: Apply to wound bed as instructed Secondary Dressing: ABD Pad, 8x10 (Generic) 1 x Per Day/30 Days Discharge Instructions: Apply over primary dressing as directed. Secondary Dressing: Optifoam Non-Adhesive Dressing, 4x4 in (Generic) 1 x Per Day/30 Days Discharge Instructions: Apply over  primary dressing as directed. Secondary Dressing: Woven Gauze Sponge, Non-Sterile 4x4 in (Generic) 1 x Per Day/30 Days Discharge Instructions: Apply over primary dressing as directed. Secondary Dressing: Zetuvit Absorbent Pad, 4x4 (in/in) (Dispense As Written) 1 x Per Day/30 Days Secured With: 60M Medipore H Soft Cloth Surgical T ape, 4 x 10 (in/yd) (Dispense As Written) 1 x Per Day/30 Days Discharge Instructions: Secure with tape as directed. Compression Wrap: Kerlix Roll 4.5x3.1 (in/yd) (Generic) 1 x Per Day/30 Days Discharge Instructions: Apply Kerlix and Coban compression as directed. Olivo, [name] ([id number]) [id number] Page 10 of 21 Compression Wrap: 60M ACE Elastic Bandage With VELCRO Brand Closure, 4 (in) (Generic) 1 x Per Day/30 Days Electronic Signature(s) Signed: 02/09/2022 12:55:38 PM By: [name] MD FACS Entered By: [name] on 02/09/2022 10:00:41 -------------------------------------------------------------------------------- Problem List Details Patient Name: Date of Service: [name], [name] 02/09/2022 8:15 A M Medical Record Number: [id number] Patient Account Number: [id number] Date of Birth/Sex: Treating RN: [birth year] (36 y.o. [name] Primary Care Provider: [name] Other Clinician: Referring Provider: Treating Provider/Extender: [name] in Treatment: 119 Active Problems ICD-10 Encounter Code Description Active Date MDM Diagnosis E11.621 Type 2 diabetes mellitus with foot ulcer 10/24/2019 No Yes L97.528 Non-pressure chronic ulcer of other part of left foot with other specified 10/24/2019 No Yes severity L97.518 Non-pressure chronic ulcer of other part of right foot with other specified 10/24/2019 No Yes severity Inactive Problems ICD-10 Code Description Active Date Inactive Date L97.518 Non-pressure chronic ulcer of other part of right foot with other specified severity 07/14/2020 07/14/2020 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, right ankle and foot 10/24/2019 10/24/2019 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 08/10/2020 08/10/2020 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, left ankle and foot 10/24/2019 10/24/2019 L97.322 Non-pressure chronic ulcer of left ankle with fat layer exposed 09/29/2021 09/29/2021 B95.62 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 10/24/2019 10/24/2019 classified elsewhere Resolved Problems Electronic Signature(s) Signed: 02/09/2022 9:43:09 AM By: [name] MD [place], [name] ([id number]) By: [name] MD [place] [phone].pdf Page 11 of 21 Signed: 02/09/2022 9:43:09 AM Entered By: [name] on 02/09/2022 09:43:08 -------------------------------------------------------------------------------- Progress Note Details Patient Name: Date of Service: [name] 02/09/2022 8:15 A M Medical Record Number: [id number] Patient Account Number: [id number] Date of Birth/Sex: Treating RN: 1986/02/04 (36 y.o. 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 He comes in for review of a wound over the plantar fifth metatarsal head extending into the lateral part of the foot. He was followed for this previously by his podiatrist Dr. Cornelius Moras. As the patient tells his story he went to see podiatry first for a swelling he developed on the lateral part of his fifth metatarsal head in May. He states this was "open" by podiatry and the area closed. He was followed up in June and it was again opened callus removed and it closed promptly. There were plans being made for surgery on the fifth metatarsal head in June however  his blood sugar was apparently too high for anesthesia. Apparently the area was debrided and opened again in June and it is never closed since. Looking over the records from podiatry I am really not able to follow this. It was clear when he was first seen it was before 5/14 at that point he already had a wound. By 5/17 the ulcer was resolved. I do not see anything about a procedure. On 5/28 noted to have pre-ulcerative moderate keratosis. X-ray noted 1/5 contracted toe and tailor's bunion and metatarsal deformity. On a visit date on 09/28/2018 the dorsal part of the left foot it healed and resolved. There was concern about swelling in his lower extremity he was sent to the ER.. As far as I can tell he was seen in the ER on 7/12 with an ulcer on his left foot. A DVT rule out of the left leg was negative. I do not think I have complete records from podiatry but I am not able to verify the procedures this patient states he had. He states after the last procedure the wound  has never closed although I am not able to follow this in the records I have from podiatry. He has not had a recent x-ray The patient has been using Neosporin on the wound. He is wearing a Darco shoe. He is still very active up on his foot working and exercising. Past medical history; type 2 diabetes ketosis-prone, leg swelling with a negative DVT study in July. Non-smoker ABI in our clinic was 0.85 on the left 10/16; substantial wound on the plantar left fifth met head extending laterally almost to the dorsal fifth MTP. We have been using silver alginate we gave him a Darco forefoot off loader. An x-ray did not show evidence of osteomyelitis did note soft tissue emphysema which I think was due to gas tracking through an open wound. There is no doubt in my mind he requires an MRI 10/23; MRI not booked until 3 November at the earliest this is largely due to his glucose sensor in the right arm. We have been using silver alginate. There has been an improvement 10/29; I am still not exactly sure when his MRI is booked for. He says it is the third but it is the 10th in epic. This definitely needs to be done. He is running a low-grade fever today but no other symptoms. No real improvement in the 1 02/26/2019 patient presents today for a follow-up visit here in our clinic he is last been seen in the clinic on October 29. Subsequently we were working on getting MRI to evaluate and see what exactly was going on and where we would need to go from the standpoint of whether or not he had osteomyelitis and again what treatments were going be required. Subsequently the patient ended up being admitted to the hospital on 02/07/2019 and was discharged on 02/14/2019. This is a somewhat interesting admission with a discharge diagnosis of pneumonia due to COVID-19 although he was positive for COVID-19 when tested at the urgent care but negative x2 when he was actually in the hospital. With that being said he did have  acute respiratory failure with hypoxia and it was noted he also have a left foot ulceration with osteomyelitis. With that being said he did require oxygen for his pneumonia and I level 4 L. He was placed on antivirals and steroids for the COVID-19. He was also transferred to the [facility] at one point. Nonetheless he  did subsequently discharged home and since being home has done much better in that regard. The CT angiogram did not show any pulmonary embolism. With regard to the osteomyelitis the patient was placed on vancomycin and Zosyn while in the hospital but has been changed to Augmentin at discharge. It was also recommended that he follow- up with wound care and podiatry. Podiatry however wanted him to see [name] according to the patient prior to them doing anything further. His hemoglobin A1c was 9.9 as noted in the hospital. Have an MRI of the left foot performed while in the hospital on 02/04/2019. 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 There is an overlying plantar open wound noted an abscess tracking back along the lateral aspect of the fifth metatarsal shaft. There is otherwise diffuse cellulitis and mild fasciitis without findings of polymyositis. The patient did have recently pneumonia secondary to COVID-19 I looked in the chart through epic and it does appear that the patient may need to have an additional x-ray just to ensure everything is cleared and that he has no airspace disease prior to putting him into the [name]. 03/05/2019; patient was readmitted to the clinic last week. He was hospitalized twice for a viral upper respiratory tract infection from 11/1 through 11/4 and then 11/5 through 11/12 ultimately this turned out to be Covid pneumonitis. Although he was discharged on oxygen he is not using it. He says he feels fine. He has no exercise limitation no cough no sputum. His  O2 sat in our clinic today was 100% on room air. 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 He received Vanco and Zosyn in the hospital and then was discharged on 2 weeks of Augmentin. I do not see any relevant cultures. He was supposed to follow-up with infectious disease but I do not see that he has an appointment. 12/8; patient saw Dr. Novella Olive of infectious disease last week. He felt that he had had adequate antibiotic therapy. He did not go to follow-up with Dr. Amalia Hailey of podiatry and I have again talked to him about the pros and cons of this. He does not want to consider a ray amputation of this time. He is aware of the risks of recurrence, migration etc. He started HBO today and tolerated this well. He can complete the Augmentin that I gave him last week. I have looked over the lab work that Dr. Chana Bode ordered his C-reactive protein was 3.3 and his sedimentation rate was 17. The C-reactive protein is never really been measurably that high in this patient 12/15; not much change in the wound today however he has undermining along the lateral part of the foot again more extensively than last week. He has some [place], [name] ([id number]) [id number] Page 12 of 21 rims of epithelialization. We have been using silver alginate. He is undergoing hyperbarics but did not dive today 12/18; in for his obligatory first total contact cast change. Unfortunately there was pus coming from the undermining area around his fifth metatarsal head. This was cultured but will preclude reapplication of a cast. He is seen in conjunction with HBO 12/24; patient had staph lugdunensis in the wound in the undermining area laterally last time. We put him on doxycycline which should have covered this. The wound looks better today. I am going to give  him another week of doxycycline  before reattempting the total contact cast 12/31; the patient is completing antibiotics. Hemorrhagic debris in the distal part of the wound with some undermining distally. He also had hyper granulation. Extensive debridement with a #5 curette. The infected area that was on the lateral part of the fifth met head is closed over. I do not think he needs any more antibiotics. Patient was seen prior to HBO. Preparations for a total contact cast were made in the cast will be placed post hyperbarics 04/11/19; once again the patient arrives today without complaint. He had been in a cast all week noted that he had heavy drainage this week. This resulted in large raised areas of macerated tissue around the wound 1/14; wound bed looks better slightly smaller. Hydrofera Blue has been changing himself. He had a heavy drainage last week which caused a lot of maceration around the wound so I took him out of a total contact cast he says the drainage is actually better this week He is seen today in conjunction with HBO 1/21; returns to clinic. He was up in [place] for a day or 2 attending a funeral. He comes back in with the wound larger and with a large area of exposed bone. He had osteomyelitis and septic arthritis of the fifth left metatarsal head while he was in hospital. He received IV antibiotics in the hospital for a prolonged period of time then 3 weeks of Augmentin. Subsequently I gave him 2 weeks of doxycycline for more superficial wound infection. When I saw this last week the wound was smaller the surface of the wound looks satisfactory. 1/28; patient missed hyperbarics today. Bone biopsy I did last time showed Enterococcus faecalis and Staphylococcus lugdunensis . He has a wide area of exposed bone. We are going to use silver alginate as of today. I had another ethical discussion with the patient. 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 In this situation I think  the likelihood of healing this is low. Therefore I have recommended a ray amputation and with the patient's agreement I have referred him to Dr. Doran Durand. The other issue is that his compliance with hyperbarics has been minimal because of his work schedule and given his underlying decision I am going to stop this today READMISSION 10/24/2019 MRI 09/29/2019 left foot IMPRESSION: 1. Apparent skin ulceration inferior and lateral to the 5th metatarsal base with underlying heterogeneous T2 signal and enhancement in the subcutaneous fat. Small peripherally enhancing fluid collections along the plantar and lateral aspects of the 5th metatarsal base suspicious for abscesses. 2. Interval amputation through the mid 5th metatarsal with nonspecific low-level marrow edema and enhancement. Given the proximity to the adjacent soft tissue inflammatory changes, osteomyelitis cannot be excluded. 3. The additional bones appear unremarkable. MRI 09/29/2019 right foot IMPRESSION: 1. Soft tissue ulceration lateral to the 5th MTP joint. There is low-level T2 hyperintensity within the 4th and 5th metatarsal heads and adjacent proximal phalanges without abnormal T1 signal or cortical destruction. These findings are nonspecific and could be seen with early marrow edema, hyperemia or early osteomyelitis. No evidence of septic joint. 2. Mild tenosynovitis and synovial enhancement associated with the extensor digitorum tendons at the level of the midfoot. 3. Diffuse low-level muscular T2 hyperintensity and enhancement, most consistent with diabetic myopathy. LEFT FOOT BONE Methicillin resistant staphylococcus aureus Staphylococcus lugdunensis MIC MIC CIPROFLOXACIN >=8 RESISTANT Resistant <=0.5 SENSI... Sensitive CLINDAMYCIN <=0.25 SENS... Sensitive >=8 RESISTANT Resistant ERYTHROMYCIN >=8 RESISTANT Resistant >=  8 RESISTANT Resistant GENTAMICIN <=0.5 SENSI... Sensitive <=0.5 SENSI... Sensitive Inducible Clindamycin  NEGATIVE Sensitive NEGATIVE Sensitive OXACILLIN >=4 RESISTANT Resistant 2 SENSITIVE Sensitive RIFAMPIN <=0.5 SENSI... Sensitive <=0.5 SENSI... Sensitive TETRACYCLINE <=1 SENSITIVE Sensitive <=1 SENSITIVE Sensitive TRIMETH/SULFA <=10 SENSIT Sensitive <=10 SENSIT Sensitive ... Marland Kitchen.. VANCOMYCIN 1 SENSITIVE Sensitive <=0.5 SENSI... Sensitive Right foot bone . Component 3 wk ago Specimen Description BONE Special Requests RIGHT 4 METATARSAL SAMPLE B Gram Stain NO WBC SEEN NO ORGANISMS SEEN Culture RARE METHICILLIN RESISTANT STAPHYLOCOCCUS AUREUS NO ANAEROBES ISOLATED Performed at [facility], [place] [address], [place], [place] [address] Report Status 10/08/2019 FINAL Organism ID, Bacteria METHICILLIN RESISTANT STAPHYLOCOCCUS AUREUS Resulting Agency [facility] Susceptibility Methicillin resistant staphylococcus aureus MIC CIPROFLOXACIN >=8 RESISTANT Resistant [name], [name] ([id number]) [id number] Page 13 of 21 CLINDAMYCIN <=0.25 SENS... Sensitive ERYTHROMYCIN >=8 RESISTANT Resistant GENTAMICIN <=0.5 SENSI... Sensitive Inducible Clindamycin NEGATIVE Sensitive OXACILLIN >=4 RESISTANT Resistant RIFAMPIN <=0.5 SENSI... Sensitive TETRACYCLINE <=1 SENSITIVE Sensitive TRIMETH/SULFA <=10 SENSIT Sensitive ... VANCOMYCIN 1 SENSITIVE Sensitive This is a patient we had in clinic earlier this year with a wound over his left fifth metatarsal head. 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 Doran Durand on 05/16/2019. He seems to have developed problems with wounds on his bilateral feet in June 2021 although he may have had problems earlier than this. He was in an urgent care with a right foot  ulcer on 09/26/2019 and given a course of doxycycline. This was apparently after having trouble getting into see orthopedics. He was seen by podiatry on 09/28/2019 noted to have bilateral lower extremity ulcers including the left lateral fifth metatarsal base and the right subfifth met head. It was noted that had purulent drainage at that time. He required hospitalization from 6/20 through 7/2. This was because of worsening right foot wounds. He underwent bilateral operative incision and drainage and bone biopsies bilaterally. Culture results are listed above. He has been referred back to clinic by Dr. Jacqualyn Posey of podiatry. He is also followed by Dr. Megan Salon who saw him yesterday. He was discharged from hospital on Zyvox Flagyl and Levaquin and yesterday changed to doxycycline Flagyl and Levaquin. His inflammatory markers on 6/26 showed a sedimentation rate of 129 and a C-reactive protein of 5. This is improved to 14 and 1.3 respectively. This would indicate improvement. ABIs in our clinic today were 1.23 on the right and 1.20 on the left 11/01/2019 on evaluation today patient appears to be doing fairly well in regard to the wounds on his feet at this point. Fortunately there is no signs of active infection at this time. No fevers, chills, nausea, vomiting, or diarrhea. He currently is seeing infectious disease and still under their care at this point. Subsequently he also has both wounds which she has not been using collagen on as he did not receive that in his packaging he did not call us and let us know that. Apparently that just was missed on the order. Nonetheless we will get that straightened out today. 8/9-Patient returns for bilateral foot wounds, using Prisma with hydrogel moistened dressings, and the wounds appear stable. Patient using surgical shoes, avoiding much pressure or weightbearing as much as possible 8/16; patient has bilateral foot wounds. 1 on the right lateral foot proximally  the  other is on the left mid lateral foot. Both required debridement of callus and thick skin around the wounds. We have been using silver collagen 8/27; patient has bilateral lateral foot wounds. The area on the left substantially surrounded by callus and dry skin. This was removed from the wound edge. The underlying wound is small. The area on the right measured somewhat smaller today. 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 Unfortunately I did not update his antibiotics during today's visit. 9/10 I reviewed Dr. Hale Bogus last notes he felt he had completed antibiotics his inflammatory markers were reasonably well controlled. He has a small wound on the lateral left foot and a tiny area on the right which is just above closed. He is using Hydrofera Blue with border foam he has bilateral surgical shoes 9/24; 2 week f/u. doing well. right foot is closed. left foot still undermined. 10/14; right foot remains closed at the fifth met head. The area over the base of the left fifth metatarsal has a small open area but considerable undermining towards the plantar foot. Thick callus skin around this suggests an adequate pressure relief. We have talked about this. He says he is going to go back into his cam boot. I suggested a total contact cast he did not seem enamored with this suggestion 10/26; left foot base of the fifth metatarsal. Same condition as last time. He has skin over the area with an open wound however the skin is not adherent. He went to see Dr. Earleen Newport who did an x-ray and culture of his foot I have not reviewed the x-ray but the patient was not told anything. He is on doxycycline 11/11; since the patient was last here he was in the emergency room on 10/30 he was concerned about swelling in the left foot. They did not do any cultures or x-rays. They changed his antibiotics to cephalexin. Previous culture showed group B strep.  The cephalexin is appropriate as doxycycline has less than predictable coverage. Arrives in clinic today with swelling over this area under the wound. He also has a new wound on the right fifth metatarsal head 11/18; the patient has a difficult wound on the lateral aspect of the left fifth metatarsal head. The wound was almost ballotable last week I opened it slightly expecting to see purulence however there was just bleeding. I cultured this this was negative. X-ray unchanged. We are trying to get an MRI but I am not sure were going to be able to get this through his insurance. He also has an area on the right lateral fifth metatarsal head this looks healthier 12/3; the patient finally got our MRI. Surprisingly this did not show osteomyelitis. I did show the soft tissue ulceration at the lateral plantar aspect of the fifth metatarsal base with a tiny residual 6 mm abscess overlying the superficial fascia I have tried to culture this area I have not been able to get this to grow anything. Nevertheless the protruding tissue looks aggravated. I suspect we should try to treat the underlying "abscess with broad-spectrum antibiotics. I am going to start him on Levaquin and Flagyl. He has much less edema in his legs and I am going to continue to wrap his legs and see him weekly 12/10. I started Levaquin and Flagyl on him last week. He just picked up the Flagyl apparently there was some delay. The worry is the wound on the left fifth  metatarsal base which is substantial and worsening. His foot looks like he inverts at the ankle making this a weightbearing surface. Certainly no improvement in fact I think the measurements of this are somewhat worse. We have been using 12/17; he apparently just got the Levaquin yesterday this is 2 weeks after the fact. He has completed the Flagyl. The area over the left fifth metatarsal base still has protruding granulation tissue although it does not look quite as bad as it  did some weeks ago. He has severe bilateral lymphedema although we have not been treating him for wounds on his legs this is definitely going to require compression. There was so much edema in the left I did not wish to put him in a total contact cast today. I am going to increase his compression from 3-4 layer. The area on the right lateral fifth met head actually look quite good and superficial. 12/23; patient arrived with callus on the right fifth met head and the substantial hyper granulated callused wound on the base of his fifth metatarsal. He says he is completing his Levaquin in 2 days but I do not think that adds up with what I gave him but I will have to double check this. We are using Hydrofera Blue on both areas. My plan is to put the left leg in a cast the week after New Year's 04/06/2020; patient's wounds about the same. Right lateral fifth metatarsal head and left lateral foot over the base of the fifth metatarsal. There is undermining on the left lateral foot which I removed before application of total contact cast continuing with Hydrofera Blue new. Patient tells me he was seen by endocrinology today lab work was done [Dr. Kerr]. Also wondering whether he was referred to cardiology. I went over some lab work from previously does not have chronic renal failure certainly not nephrotic range proteinuria he does have very poorly controlled diabetes but this is not his most updated lab work. Hemoglobin A1c has been over 11 1/10; the patient had a considerable amount of leakage towards mid part of his left foot with macerated skin however the wound surface looks better the area on the right lateral fifth met head is better as well. I am going to change the dressing on the left foot under the total contact cast to silver alginate, continue with Hydrofera Blue on the right. 1/20; patient was in the total contact cast for 10 days. Considerable amount of drainage although the skin around the  wound does not look too bad on the left foot. The area on the right fifth metatarsal head is closed. Our nursing staff reports large amount of drainage out of the left lateral foot wound 1/25; continues with copious amounts of drainage described by our intake staff. PCR culture I did last week showed E. coli and Enterococcus faecalis and low quantities. Multiple resistance genes documented including extended spectrum beta lactamase, MRSA, MRSE, quinolone, tetracycline. The wound is not quite as good this week as it was 5 days ago but about the same size 2/3; continues with copious amounts of malodorous drainage per our intake nurse. The PCR culture I did 2 weeks ago showed E. coli and low quantities of Enterococcus. There were multiple resistance genes detected. I put Neosporin on him last week although this does not seem to have helped. The wound is slightly deeper today. Offloading continues to be an issue here although with the amount of drainage she has a total contact cast is  just not going to work 2/10; moderate amount of drainage. Patient reports he cannot get his stocking on over the dressing. I told him we have to do that the nurse gave him suggestions on how to make this work. The wound is on the bottom and lateral part of his left foot. Is cultured predominantly grew low amounts of Enterococcus, E. coli and anaerobes. There were multiple resistance genes detected including extended spectrum beta lactamase, quinolone, tetracycline. I could not think of an easy oral combination to address this so for now I am going to do topical antibiotics provided by [facility] I think the main agents here are vancomycin and an aminoglycoside. We have to be able to give him access to the wounds to get the topical antibiotic on 2/17; moderate amount of drainage this is unchanged. He has his Keystone topical antibiotic against the deep tissue culture organisms. He has been using this [name], [name] ([id number])  [id number] Page 14 of 21 and changing the dressing daily. Silver alginate on the wound surface. 2/24; using Keystone antibiotic with silver alginate on the top. He had too much drainage for a total contact cast at one point although I think that is improving and I think in the next week or 2 it might be possible to replace a total contact cast I did not do this today. In general the wound surface looks healthy however he continues to have thick rims of skin and subcutaneous tissue around the wide area of the circumference which I debrided 06/04/2020 upon evaluation today patient appears to be doing well in regard to his wound. I do feel like he is showing signs of improvement. There is little bit of callus and dead tissue around the edges of the wound as well as what appears to be a little bit of a sinus tract that is off to the side laterally I would perform debridement to clear that away today. 3/17; left lateral foot. The wound looks about the same as I remember. Not much depth surface looks healthy. No evidence of infection 3/25; left lateral foot. Wound surface looks about the same. Separating epithelium from the circumference. There really is no evidence of infection here however not making progress by my view 3/29; left lateral foot. Surface of the wound again looks reasonably healthy still thick skin and subcutaneous tissue around the wound margins. There is no evidence of infection. One of the concerns being brought up by the nurses has again the amount of drainage vis--vis continued use of a total contact cast 4/5; left lateral foot at roughly the base of the fifth metatarsal. Nice healthy looking granulated tissue with rims of epithelialization. The overall wound measurements are not any better but the tissue looks healthy. The only concern is the amount of drainage although he has no surrounding maceration with what we have been doing recently to absorb fluid  and protect his skin. He also has lymphedema. 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 He states that the kerlix/coban under the TCC rolled down and could not get it back up. He has been using an offloading scooter and has somehow hurt his right foot using this device. This happened last week. He states that the side of his  right foot developed a blister and opened. The top of his foot also has a few small open wounds he thinks is due to his socks rubbing in his shoes. He has not been using any dressings to the wound. He denies purulent drainage, fever/chills or erythema to the wounds. 4/22; patient presents for 1 week follow-up. He developed new wounds to the right foot that were evaluated at last clinic visit. He continues to have a total contact cast to the left leg and he reports no issues. He has been using silver collagen to the right foot wounds with no issues. He denies purulent drainage, fever/chills or erythema to the right foot wounds. He has no complaints today 4/25; patient presents for 1 week follow-up. He has a total contact cast of the left leg and reports no issues. He has been using silver alginate to the right foot wound. He denies purulent drainage, fever/chills or erythema to the right foot wounds. 5/2 patient presents for 1 week follow-up. T contact cast on the left. The wound which is on the base of the plantar foot at the base of the fifth metatarsal otal actually looks quite good and dimensions continue to gradually contract. HOWEVER the area on the right lateral fifth metatarsal head is much larger than what I remember from 2 weeks ago. Once more is he has significant levels of hypergranulation. Noteworthy that he had this same  hyper granulated response on his wound on the left foot at one point in time. So much so that he I thought there was an underlying fluid collection. Based on this I think this just needs debridement. 5/9; the wound on the left actually continues to be gradually smaller with a healthy surface. Slight amount of drainage and maceration of the skin around but not too bad. However he has a large wound over the right fifth metatarsal head very much in the same configuration as his left foot wound was initially. I used silver nitrate to address the hyper granulated tissue no mechanical debridement 5/16; area on the left foot did not look as healthy this week deeper thick surrounding macerated skin and subcutaneous tissue. oo The area on the right foot fifth met head was about the same oo The area on the right ankle that we identified last week is completely broken down into an open wound presumably a stocking rubbing issue 5/23; patient has been using a total contact cast to the left side. He has been using silver alginate underneath. He has also been using silver alginate to the right foot wounds. He has no complaints today. He denies any signs of infection. 5/31; the left-sided wound looks some better measure smaller surface granulation looks better. We have been using silver alginate under the total contact cast oo The large area on his right fifth met head and right dorsal foot look about the same still using silver alginate 6/6; neither side is good as I was hoping although the surface area dimensions are better. A lot of maceration on his left and right foot around the wound edge. Area on the dorsal right foot looks better. He says he was traveling. I am not sure what does the amount of maceration around the plantar wounds may be drainage issues 6/13; in general the wound surfaces look quite good on both sides. Macerated skin and raised edges around the wound required debridement although in  general especially on the left the surface area seems improved. oo The area on the right dorsal  ankle is about the same I thought this would not be such a problem to close 6/20; not much change in either wound although the one on the right looks a little better. Both wounds have thick macerated edges to the skin requiring debridements. We have been using silver alginate. The area on the dorsal right ankle is still open I thought this would be closed. 6/28; patient comes in today with a marked deterioration in the right foot wound fifth met head. Wide area of exposed bone this is a drastic change from last time. The area on the left there we have been casting is stagnant. We have been using silver alginate in both wound areas. 7/5; bone culture I did for PCR last time was positive for Pseudomonas, group B strep, Enterococcus and Staph aureus. There was no suggestion of methicillin resistance or ampicillin resistant genes. This was resistant to tetracycline however He comes into the clinic today with the area over his right plantar fifth metatarsal head which had been doing so well 2 weeks ago completely necrotic feeling bone. I do not know that this is going to be salvageable. The left foot wound is certainly no smaller but it has a better surface and is superficial. 7/8; patient called in this morning to say that his total contact cast was rubbing against his foot. He states he is doing fine overall. He denies signs of infection. 7/12; continued deterioration in the wound over the right fifth metatarsal head crumbling bone. This is not going to be salvageable. The patient agrees and wants to be referred to Dr. Doran Durand which we will attempt to arrange as soon as possible. I am going to continue him on antibiotics as long as that takes so I will renew those today. The area on the left foot which is the base of the fifth metatarsal continues to look somewhat better. Healthy looking tissue no depth no  debridement is necessary here. 7/20; the patient was kindly seen by Dr. Doran Durand of orthopedics on 10/19/2020. 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 Towards this end we have taken him out of the total contact cast on the left we will put him in a wrap with Hydrofera Blue. As I understand things surgery is planned for 7/21 7/27; patient had his surgery last Thursday. 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 Apparently everything went well we did not still disturb that today The area on the left foot actually looks quite good. He has been much less mobile which probably explains this he did not seem to do well in the total contact cast secondary to drainage and maceration I think. 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 Fortunately there is no evidence of active infection at this time. No fevers, chills, nausea, vomiting, or diarrhea. Overall I think that he is actually doing extremely well. Nonetheless I do believe that he is staying off of this more following the surgery in his right foot that is the reason the left is doing so great. 8/16; left plantar foot wound. This looks smaller than the last time I saw this he is using Hydrofera Blue. The surgical wound on the right foot is being followed by Dr. Doran Durand we did not look at this today. He has surgical  shoes on both feet [name], [name] ([id number]) [id number] Page 15 of 21 8/23; left plantar foot wound not as good this week. Surrounding macerated skin and subcutaneous tissue everything looks moist and wet. I do not think he is offloading this adequately. He is using a surgical shoe Apparently the right foot surgical wound is not open although I did not check his foot 8/31; left plantar foot lateral aspect. Much  improved this week. He has no maceration. Some improvement in the surface area of the wound but most impressively the depth is come in we are using silver alginate. The patient is a Product/process development scientist. He is asked that we write him a letter so he can go back to work. I have also tried to see if we can write something that will allow him to limit the amount of time that he is on his foot at work. 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 Hopefully the latter can be avoided 9/6; I believe the patient missed an appointment last week. He arrives in today with a wound looking roughly the same certainly no better. Undermining laterally and also inferiorly. We used molecuLight today in training with the patient's permission.. We are using silver alginate 9/21 wound is measuring bigger this week although this may have to do with the aggressive circumferential debridement last week in response to the blush fluorescence on the MolecuLight. Culture I did last week showed significant MSSA and E. coli. I put him on Augmentin but he has not started it yet. We are also going to send this for compounded antibiotics at [facility]. There is no evidence of systemic infection 9/29; silver alginate. His Keystone arrived. He is completing Augmentin in 2 days. Offloading in a cam boot. Moderate drainage per our intake staff 10/5; using silver alginate. He has been using his [place]. He has completed his Augmentin. Per our intake nurse still a lot of drainage, far too much to consider a total contact cast. Wound measures about the same. He had the same undermining area that I defined last week from a roughly 11-3. I remove this today 10/12; using silver alginate he is using the [place]. He comes in for a nurse visit hence we are applying [employer] twice a week. Measuring slightly better today and less notable drainage. Extensive debridement  of the wound edge last time 10/18; using topical Keystone and silver alginate and a soft cast. Wound measurements about the same. Drainage was through his soft cast. We are changing this twice a week Tuesdays and Friday 10/25; comes in with moderate drainage. Still using Keystone silver alginate and a soft cast. Wound dimensions completely the same.He has a lot of edema in the left leg he has lymphedema. Asking for [name] to consider wrapping him as he cannot get his stocking on over the soft cast 11/2; comes in with moderate to large drainage slightly smaller in terms of width we have been using [place]. His wound looks satisfactory but not much improvement 11/4; patient presents today for obligatory cast change. Has no issues or complaints today. He denies signs of infection. 11/9; patient traveled this weekend to [place], was on the cast quite a bit. Staining of the cast with black material from his walking boot. Drainage was not quite as bad as we feared. Using silver alginate and Keystone 11/16; we do not have size for cast therefore we have been putting a  soft cast on him since the change on Friday. Still a significant amount of drainage necessitating changing twice a week. We have been using the Keystone at cast changes either hard or soft as well as silver alginate Comes in the clinic with things actually looking fairly good improvement in width. He says his offloading is about the same 02/24/2021 upon evaluation today patient actually comes back in and is doing excellent in regard to his foot ulcer this is significantly smaller even compared to the last visit. The soft cast seems to have done extremely well for him which is great news. I do not see any signs of infection minimal debridement will be needed today. 11/30; left lateral foot much improved half a centimeter improvement in surface area. No evidence of infection. He seems to be doing better with the soft cast in the TCC therefore we will  continue with this. He comes back in later in the week for a change with the nurses. This is due to drainage 12/6; no improvement in dimensions. Under illumination some debris on the surface we have been using silver alginate, soft cast. If there is anything optimistic here he seems to have have less drainage 12/13. Dimensions are improved both length and width and slightly in depth. Appears to be quite healthy today. Raised edges of this thick skin and callus around the edges however. He is in a soft cast were bringing him back once for a change on Friday. Drainage is better 12/20. Dimensions are improved. He still has raised edges of thick skin and callus around the edges. We are using a soft cast 12/28; comes in today with thick callus around the wound. Using silver under alginate under a soft cast. I do not think there is much improvement in any measurement 2023 04/06/2021; patient was put in a total contact cast. Unfortunately not much change in surface area 1/10; not much different still thick callus and skin around the edge in spite of the total contact cast. This was just debrided last week we have been using the [facility] compounded antibiotic and silver alginate under a total contact cast 1/18 the patient's wound on the left side is doing nicely. smaller HOWEVER he comes in today with a wound on the right foot laterally. blister most likely serosangquenous drainage 1/24; the patient continues to do well in terms of the plantar left foot which is continued to contract using silver alginate under the total contact cast HOWEVER the right lateral foot is bigger with denuded skin around the edges. I used pickups and a #15 scalpel to remove this this looks like the remanence of a large blister. Cannot rule out infection. Culture in this area I did last week showed Staphylococcus lugdunensis few colonies. I am going to try to address this with his [employer] antibiotic that is done so well on the left  having linezolid and this should cover the staph 2/1; the patient's wound on his left foot which was the original plantar foot wound thick skin and eschar around the edges even in the total contact cast but the wound surface does not look too bad The real problem is on how his right lateral foot at roughly the base of the fifth metatarsal. The wound is completely necrotic more worrisome than that there is swelling around the edges of this. We have been using silver alginate on both wounds and Keystone on the right foot. Unfortunately I think he is going to require systemic antibiotics while we await  cultures. He did not get the x-ray done that we ordered last week [lost the prescription 2/7; disappointingly in the area on the left foot which we are treating with a total contact cast is still not closed although it is much smaller. He continues to have a lot of callus around the wound edge. -Right lateral foot culture I did last week was negative x-ray also negative for osteomyelitis. 2/15: TCC silver alginate on the left and silver alginate on the right lateral. No real improvement in either area 05/26/2021: T oday, the wounds are roughly the same size as at his previous visit, post-debridement. He continues to endorse fairly substantial drainage, particularly on the right. He has been in a total contact cast on the left. There is still some callus surrounding this lesion. On the right, the periwound skin is quite macerated, along with surrounding callus. The center of the right-sided wound also has some dark, densely adherent material, which is very difficult to remove. 06/02/2021: Today, both wounds are slightly smaller. He has been using zinc oxide ointment around the right ulcer and the degree of maceration has improved markedly. There continues to be an area of nonviable tissue in the center of the right sided ulcer. The left-sided wound, which has been in the total contact cast. Appears clean and  the degree of callus around it is less than previously. 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 He had to take the stairs and both wounds have increased in size. The left foot, which has been in a total contact cast, has developed a tunnel tracking to the lateral aspect of his foot. The nonviable tissue in the center of the right-sided ulcer remains recalcitrant to debridement. There is significant undermining surrounding the entirety of the left sided wound. 06/16/2021: The elevator at school has been fixed and the patient has been able to avoid putting as much weight on his wounds over the past week. We [name], [name] ([id number]) [id number] Page 16 of 21 converted the left foot wound into a single lesion today, but despite this, the wound is actually smaller. The base is healthy with limited periwound callus. On the right, the central necrotic area is still present. He continues to be quite macerated around the right sided wound, despite applying barrier cream. This does, however, have the benefit of softening the callus to make it more easily removable. 06/23/2021: Today, the left wound is smaller. The lateral aspect that had opened up previously is now closed. The wound base has a healthy bed of granulation tissue and minimal slough. Unfortunately, on the right, the wound is larger and continues to be fairly macerated. He has also reopened the wound at his right ankle. He thinks this is due to the gait he has adopted secondary to his total contact cast and boot. 06/30/2021: T oday, both wounds are a little bit larger. The lateral aspect on the left has remained closed. He continues to have significant periwound maceration. The culture that I took from the right sided wound grew a population of bacteria that is not covered by his current [facility] antibiotic. The center of the right- sided wound continues  to appear necrotic with nonviable fat. It probes deeper today, but does not reach bone. 07/07/2021: The periwound maceration is a little bit less today. The right lateral foot wound has some areas that appear more viable and the necrotic center also looks a little bit better. The wound  on the dorsal surface of his right foot near the ankle is contracting and the surface appears healthy. The left plantar wound surface looks healthy, but there is some new undermining on the medial portion. He did get his new Keystone antibiotic and began applying that to the right foot wound on Saturday. 07/14/2021: The intake nurse reported substantial drainage from his wounds, but the periwound skin actually looks better than is typical for him. The wound on the dorsal surface of his right foot near the ankle is smaller and just has a small open area underneath some dried eschar. The left plantar wound surface looks healthy and there has been no significant accumulation of callus. The right lateral foot wound looks quite a bit better, with the central portion, which typically appears necrotic, looking more viable albeit pale. 07/22/2021: His left foot is extremely macerated today. The wound is about the same size. The wound on the dorsal surface of his right foot near the ankle had closed, but he traumatized it removing the dressing and there is a tiny skin tear in that location. The right lateral foot wound is bigger, but the surface appears healthy. 07/30/2021: The wound on the dorsal surface of his right foot near the ankle is closed. The right lateral foot wound again is a little bit bigger due to some undermining. The periwound skin is in better condition, however. He has been applying zinc oxide. The wound surface is a little bit dry today. On the left, he does not have the substantial maceration that we frequently see. The wound itself is smaller and has a clean surface. 08/06/2021: Both wounds seem to have  deteriorated over the past week. The right lateral foot wound has a dry surface but the periwound is boggy.. Overall wound dimensions are about the same. On the left, the wound is about the same size, but there is more undermining present underneath periwound callus. 08/13/2021: The right sided wound looks about the same, but on the left there has been substantial deterioration. The undermining continues to extend under periwound callus. Once this was removed, substantial extension of the wound was present. There is no odor or purulent drainage but clearly the wounds have broken down. 08/20/2021: The wounds look about the same today. He has been out of his total contact cast and has just been changing the dressings using topical Keystone with PolyMem Ag, Kerlix and Ace bandages. The wound on the top of his right ankle has reopened but this is quite small. There was a little bit of purulent material that I expressed when examining this wound. 08/24/2021: After the aggressive debridement I performed at his last visit, the wounds actually look a little bit better today. They are smaller with the exception of the wound on the top of his right ankle which is a little bit bigger as some more skin pulled off when he was changing his dressing. We are using topical Keystone with PolyMem Ag Kerlix and Ace bandages. 09/02/2021: There has been really no change to any of his wounds. 09/16/2021: The patient was hospitalized last week with nausea, vomiting, and dehydration. He says that while he was in the hospital, his wounds were not really addressed properly. T oday, both plantar foot wounds are larger and the periwound skin is macerated. The wound on the dorsum of his right foot has a scab on the top. The right foot now has a crater where previously he had had nonviable fat. It looks as though this simply  died and fell out. The periwound callus is wet. 09/24/2021: His wounds have deteriorated somewhat since his last  visit. The wound on the dorsum of his right foot near his ankle is larger and has more nonviable tissue present. The crater in his right foot is even deeper; I cannot quite palpate or probe to bone but I am sure it is close. The wound on his left plantar foot has an odd boggy area in the center that almost feels as though it has fluid within it. He has run out of his topical Keystone antibiotic. We are using silver alginate on his wounds. 09/29/2021: He has developed a new wound on the dorsum of his left foot near his ankle. He says he thinks his wrapping is rubbing in that site. I would concur with this as the wound on his right ankle is larger. The left foot looks about the same. The right foot has the crater that was present last week. No significant slough accumulation, but his foot remains quite swollen and warm despite oral antibiotic therapy. 10/08/2021: All of his wounds look about the same as last week. He did not start his oral antibiotics that are prescribed until just a couple of days ago; his [employer] compounded antibiotics formula has been changed and he is awaiting delivery of the new recipe. His MRI that was scheduled for earlier this week was canceled as no prior authorization had been obtained; unfortunately the tech responsible sent an email to my old [place] email, which I no longer use nor have access to. 10/18/2021: The wounds on his bilateral dorsal feet near the ankles are both improved. They are smaller and have just some eschar and slough buildup. The left plantar wound has a fair amount of undermining, but the surface is clean. There is some periwound callus accumulation. On the right plantar foot, there is nonviable fat leading to a deep tunnel that tracks towards his dorsal medial foot. There is periwound callus and slough accumulation, as well. His right foot and leg remain swollen as compared to the left. 10/25/2021: The wounds on his bilateral dorsal feet and at the  ankles have broken down somewhat. They are little bit larger than last week. The left plantar wound continues to undermine laterally but the surface is clean. The right plantar foot wound shows some decreased depth in the tunnel tracking towards his dorsal medial foot. He has not yet had the Doppler study that I ordered; it sounds like there is some confusion about the scheduling of the procedure. In addition, the MRI was denied and I have taken steps to appeal the denial. 11/24/2021: Since our last visit, Mr. Maret was admitted to the hospital where an MRI suggested osteomyelitis. He was taken the operating room by podiatry. Bone biopsies were negative for osteomyelitis. They debrided his wounds and applied myriad matrix. He saw them last week and they removed his staples. He is here today to continue his wound healing process. T oday, both of the dorsal foot/ankle wounds are substantially smaller. There is just a little eschar overlying the left sided wound and some eschar and slough on the right. The right plantar foot ulcer has the healthiest surface of granulation tissue that I have seen to date. A portion of the myriad matrix failed to take and was hanging loose. It appears that myriad morcells were placed into the tunnel closest to the dorsal portion of his foot. These have sloughed off. The left plantar foot ulcer is about  the same size, but has a much healthier surface than in the past. Both plantar ulcers have callus and slough accumulation. 12/07/2021: Left dorsal foot/ankle wound is closed. The right dorsal foot/ankle wound is nearly closed and just has a small open area with some eschar and slough. The right plantar foot wound has contracted quite a bit since our last visit. It has a healthy surface with just a little bit of slough accumulation and periwound callus buildup. The left plantar foot wound is about the same size but the surface appears healthy. There is a little slough and  periwound callus on this side, as well. 12/15/2021: Both dorsal foot/ankle wounds are closed. The right plantar foot wound is substantially smaller than at our last visit. The tunneling that was present has nearly closed. There is just a little bit of slough buildup. The left plantar foot wound is also a little bit smaller today. The surface is the healthiest that I have ever seen it. Light slough and periwound callus accumulation on this side. [name], [name] ([id number]) [id number] Page 17 of 21 12/22/2021: The dorsal ankle/foot wounds remain closed. The right plantar foot wound continues to contract. There is still a bit of depth at the lateral portion of the wound but the surface has a good granulated appearance. The wound on the left is about the same size, the central indented portion is still adherent. It also is clean with good granulation tissue present. The periwound skin and callus are a bit macerated, however. 12/29/2021: Both ankle wounds remain closed. The right plantar foot wound is less than half the size that it was last week. There is no depth any longer and the surface has just a little bit of slough and periwound callus. On the left, the wound is not much smaller, but the surface is healthier with good granulation tissue. There is also a little slough and periwound callus buildup. 01/05/2022: The right plantar foot wound continues to contract and is once again about half the size as it was last week. There is just a little bit of surface slough and periwound callus. On the left, the depth of the wound has decreased and the diameter has started to contract. It is also very clean with just a little slough and biofilm present. 01/12/2022: The right plantar foot wound is smaller again today. There is just some periwound callus and slough accumulation. On the left, there is a fair amount of undermining and the surface is a little boggy, but no other significant  change. 01/19/2022: The right plantar wound continues to contract. There is minimal periwound callus accumulation and just a light layer of slough on the surface. On the left, the surface looks better, but there is fairly significant undermining near the 11:00 portion of the wound, aiming towards his great toe. The skin overlying this area is very healthy and has a good fat layer present. No malodor or purulent drainage. 01/26/2022: The right sided wound continues to contract and has just a light layer of slough on the surface. Minimal periwound callus. On the left, he still has substantial undermining and the tissue surface is not as robust as I would like to see. No malodor or purulent drainage, but he did say he had a lot of serous drainage over the weekend. 02/02/2022: The right foot wound is about a quarter of the size that it was at his last visit. There is just a little slough on the surface and some periwound callus. On the left,  the undermining persists and the tissue is still more pale, but he does not seem to have had as much drainage over the last weekend. We are waiting on a wound VAC. 02/09/2022: His right foot has healed. He received the wound VAC, but did not bring it to clinic with him today. The lateral aspect of the left foot wound has come in a little, but he still has substantial undermining on the medial and distal portion of the wound. Still with macerated periwound callus. Patient History Information obtained from Patient. Family History No family history of Cancer, Diabetes, Hereditary Spherocytosis, Hypertension, Kidney Disease, Lung Disease, Seizures, Stroke, Thyroid Problems, Tuberculosis. Social History Never smoker, Marital Status - Single, Alcohol Use - Rarely, Drug Use - No History, Caffeine Use - Never. Medical History Eyes Denies history of Cataracts, Glaucoma, Optic Neuritis Ear/Nose/Mouth/Throat Denies history of Chronic sinus problems/congestion, Middle ear  problems Hematologic/Lymphatic Denies history of Anemia, Hemophilia, Human Immunodeficiency Virus, Lymphedema, Sickle Cell Disease Respiratory Denies history of Aspiration, Asthma, Chronic Obstructive Pulmonary Disease (COPD), Pneumothorax, Sleep Apnea, Tuberculosis Cardiovascular Denies history of Angina, Arrhythmia, Congestive Heart Failure, Coronary Artery Disease, Deep Vein Thrombosis, Hypertension, Hypotension, Myocardial Infarction, Peripheral Arterial Disease, Peripheral Venous Disease, Phlebitis, Vasculitis Gastrointestinal Denies history of Cirrhosis , Colitis, Crohnoos, Hepatitis A, Hepatitis B, Hepatitis C Endocrine Patient has history of Type II Diabetes Denies history of Type I Diabetes Immunological Denies history of Lupus Erythematosus, Raynaudoos, Scleroderma Integumentary (Skin) Denies history of History of Burn Musculoskeletal Denies history of Gout, Rheumatoid Arthritis, Osteoarthritis, Osteomyelitis Neurologic Denies history of Dementia, Neuropathy, Quadriplegia, Paraplegia, Seizure Disorder Oncologic Denies history of Received Chemotherapy, Received Radiation Psychiatric Denies history of Anorexia/bulimia, Confinement Anxiety Hospitalization/Surgery History - 11/1-11/06/2018- sepsis foot infection. - 11/4-11/5 02 sats low respiratory distress. Objective Constitutional Slightly hypertensive. No acute distress.. Vitals Time Taken: 8:29 AM, Height: 77 in, Weight: 280 lbs, BMI: 33.2, Temperature: 98.8 F, Pulse: 93 bpm, Respiratory Rate: 18 breaths/min, Blood Pressure: 140/96 mmHg, Capillary Blood Glucose: 129 mg/dl. General Notes: glucose per pt report this am [name], [name] ([id number]) [id number] Page 18 of 21 Respiratory Normal work of breathing on room air.. General Notes: 02/09/2022: His right foot has healed. The lateral aspect of the left foot wound has come in a little, but he still has substantial undermining on the medial  and distal portion of the wound. Still with macerated periwound callus. Integumentary (Hair, Skin) Wound #3 status is Open. Original cause of wound was Trauma. The date acquired was: 10/02/2019. The wound has been in treatment 119 weeks. The wound is located on the [place]. The wound measures 3.8cm length x 3.5cm width x 1.1cm depth; 10.446cm^2 area and 11.49cm^3 volume. There is Fat Layer (Subcutaneous Tissue) exposed. There is undermining starting at 3:00 and ending at 7:00 with a maximum distance of 2.3cm. There is a medium amount of serosanguineous drainage noted. The wound margin is well defined and not attached to the wound base. There is large (67-100%) red, hyper - granulation within the wound bed. There is a small (1-33%) amount of necrotic tissue within the wound bed including Adherent Slough. The periwound skin appearance had no abnormalities noted for color. The periwound skin appearance exhibited: Callus, Maceration. Periwound temperature was noted as No Abnormality. Wound #8 status is Open. Original cause of wound was Blister. The date acquired was: 04/18/2021. The wound has been in treatment 42 weeks. The wound is located on the Right,Lateral Foot. The wound measures 0cm length x 0cm width x 0cm depth; 0cm^2 area and  0cm^3 volume. There is no tunneling or undermining noted. There is a none present amount of drainage noted. There is no granulation within the wound bed. There is no necrotic tissue within the wound bed. The periwound skin appearance had no abnormalities noted for moisture. The periwound skin appearance had no abnormalities noted for color. The periwound skin appearance exhibited: Callus. Periwound temperature was noted as No Abnormality. 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 There was a Selective/Open Wound Skin/Epidermis Debridement with a total area of 3.8 sq cm performed by [name], MD. With the following instrument(s): Curette to remove Non-Viable tissue/material. Material removed includes Callus and Skin: Epidermis and after achieving pain control using Lidocaine 4% Topical Solution. No specimens were taken. A time out was conducted at 09:05, prior to the start of the procedure. There was no bleeding. The procedure was tolerated well with a pain level of 0 throughout and a pain level of 0 following the procedure. Post Debridement Measurements: 3.8cm length x 3.5cm width x 1.1cm depth; 11.49cm^3 volume. Character of Wound/Ulcer Post Debridement is improved. Severity of Tissue Post Debridement is: Fat layer exposed. Post procedure Diagnosis Wound #3: Same as Pre-Procedure General Notes: scribed by Baruch Gouty, RN for Dr. [name]. Plan Follow-up Appointments: Return Appointment in 1 week. - Dr. [name] - Room 1 with [name] Nurse Visit: - Friday 11/10 @ 08:15 am RM 1 with [name] for [facility] application Bathing/ Shower/ Hygiene: May shower and wash wound with soap and water. Negative Presssure Wound Therapy: Wound #3 Left,Lateral,Plantar Foot: Wound Vac to wound continuously at 131m/hg pressure - initiate on Friday, Change 2x per week Black Foam Edema Control - Lymphedema / SCD / Other: Elevate legs to the level of the heart or above for 30 minutes daily and/or when sitting, a frequency of: - throughout the day Avoid standing for long periods of time. Exercise regularly Compression stocking or Garment 20-30 mm/Hg pressure to: - to both legs daily Off-Loading: Open toe surgical shoe to: - Both feet Additional Orders /  Instructions: Follow Nutritious Diet Non Wound Condition: Apply the following to affected area as directed: - continue to apply foam donut or cushion to healed right plantar foot WOUND #3: - Foot Wound Laterality: Plantar, Left, Lateral Cleanser: Soap and Water 1 x Per Day/30 Days Discharge Instructions: May shower and wash wound with dial antibacterial soap and water prior to dressing change. Peri-Wound Care: Zinc Oxide Ointment 30g tube 1 x Per Day/30 Days Discharge Instructions: Apply Zinc Oxide to periwound with each dressing change Peri-Wound Care: Sween Lotion (Moisturizing lotion) 1 x Per Day/30 Days Discharge Instructions: Apply moisturizing lotion as directed Prim Dressing: Hydrofera Blue Ready Foam, 4x5 in (Generic) 1 x Per Day/30 Days aRamesses Crampton [name]([id number] Page 19 of 21 Discharge Instructions: Apply to wound bed as instructed Secondary Dressing: ABD Pad, 8x10 (Generic) 1 x Per Day/30 Days Discharge Instructions: Apply over primary dressing as directed. Secondary Dressing: Optifoam Non-Adhesive Dressing, 4x4 in (Generic) 1 x Per Day/30 Days Discharge Instructions: Apply over primary dressing as directed. Secondary Dressing: Woven Gauze Sponge, Non-Sterile 4x4 in (Generic) 1 x Per Day/30 Days Discharge Instructions: Apply  over primary dressing as directed. Secondary Dressing: Zetuvit Absorbent Pad, 4x4 (in/in) (Dispense As Written) 1 x Per Day/30 Days Secured With: 7M Medipore H Soft Cloth Surgical T ape, 4 x 10 (in/yd) (Dispense As Written) 1 x Per Day/30 Days Discharge Instructions: Secure with tape as directed. Compression Wrap: Kerlix Roll 4.5x3.1 (in/yd) (Generic) 1 x Per Day/30 Days Discharge Instructions: Apply Kerlix and Coban compression as directed. Compression Wrap: 7M ACE Elastic Bandage With VELCRO Brand Closure, 4 (in) (Generic) 1 x Per Day/30 Days 02/09/2022: His right foot has healed. The lateral aspect of the left  foot wound has come in a little, but he still has substantial undermining on the medial and distal portion of the wound. Still with macerated periwound callus. I used a curette to debride the periwound callus off of his left foot. He will have a nurse visit on Friday to have his wound VAC applied. I asked him to please pad and protect his right foot to avoid reopening the wound. Follow-up with me in 1 week. Electronic Signature(s) Signed: 02/09/2022 10:02:31 AM By: [name] MD FACS Entered By: [name] on 02/09/2022 10:02:31 -------------------------------------------------------------------------------- HxROS Details Patient Name: Date of Service: [name], [name] 02/09/2022 8:15 A M Medical Record Number: [id number] Patient Account Number: [id number] Date of Birth/Sex: Treating RN: 1985/12/08 (36 y.o. M) Primary Care Provider: [name] Other Clinician: Referring Provider: Treating Provider/Extender: [name] in Treatment: 66 Information Obtained From Patient Eyes Medical History: Negative for: Cataracts; Glaucoma; Optic Neuritis Ear/Nose/Mouth/Throat Medical History: Negative for: Chronic sinus problems/congestion; Middle ear problems Hematologic/Lymphatic Medical History: Negative for: Anemia; Hemophilia; Human Immunodeficiency Virus; Lymphedema; Sickle Cell Disease Respiratory Medical History: Negative for: Aspiration; Asthma; Chronic Obstructive Pulmonary Disease (COPD); Pneumothorax; Sleep Apnea; Tuberculosis Cardiovascular Medical History: Negative for: Angina; Arrhythmia; Congestive Heart Failure; Coronary Artery Disease; Deep Vein Thrombosis; Hypertension; Hypotension; Myocardial Infarction; Peripheral Arterial Disease; Peripheral Venous Disease; Phlebitis; Vasculitis Gastrointestinal Medical History: Negative for: Cirrhosis ; Colitis; Crohns; Hepatitis A; Hepatitis B; Hepatitis C [name], [name] ([id number])  [id number].pdf Page 20 of 21 Endocrine Medical History: Positive for: Type II Diabetes Negative for: Type I Diabetes Time with diabetes: 8 Treated with: Insulin Blood sugar tested every day: No Immunological Medical History: Negative for: Lupus Erythematosus; Raynauds; Scleroderma Integumentary (Skin) Medical History: Negative for: History of Burn Musculoskeletal Medical History: Negative for: Gout; Rheumatoid Arthritis; Osteoarthritis; Osteomyelitis Neurologic Medical History: Negative for: Dementia; Neuropathy; Quadriplegia; Paraplegia; Seizure Disorder Oncologic Medical History: Negative for: Received Chemotherapy; Received Radiation Psychiatric Medical History: Negative for: Anorexia/bulimia; Confinement Anxiety Immunizations Pneumococcal Vaccine: Received Pneumococcal Vaccination: No Implantable Devices None Hospitalization / Surgery History Type of Hospitalization/Surgery 11/1-11/06/2018- sepsis foot infection 11/4-11/5 02 sats low respiratory distress Family and Social History Cancer: No; Diabetes: No; Hereditary Spherocytosis: No; Hypertension: No; Kidney Disease: No; Lung Disease: No; Seizures: No; Stroke: No; Thyroid Problems: No; Tuberculosis: No; Never smoker; Marital Status - Single; Alcohol Use: Rarely; Drug Use: No History; Caffeine Use: Never; Financial Concerns: No; Food, Clothing or Shelter Needs: No; Support System Lacking: No; Transportation Concerns: No Electronic Signature(s) Signed: 02/09/2022 12:55:38 PM By: [name] MD FACS Entered By: [name] on 02/09/2022 09:59:53 -------------------------------------------------------------------------------- SuperBill Details Patient Name: Date of Service: [name], [name] 02/09/2022 Medical Record Number: [id number] Patient Account Number: [id number] Date of Birth/Sex: Treating RN: 1985-10-22 (36 y.o. M) Primary Care Provider: [name] Other  Clinician: Referring Provider: Treating Provider/Extender: Wilfrid Lund [place], [name] ([id number]) [id number] Page 21 of 21 Weeks in Treatment:  119 Diagnosis Coding ICD-10 Codes Code Description E11.621 Type 2 diabetes mellitus with foot ulcer L97.528 Non-pressure chronic ulcer of other part of left foot with other specified severity L97.518 Non-pressure chronic ulcer of other part of right foot with other specified severity Facility Procedures : CPT4 Code: [id number] Description: 35573 - DEBRIDE WOUND 1ST 20 SQ CM OR < ICD-10 Diagnosis Description L97.528 Non-pressure chronic ulcer of other part of left foot with other specified severi Modifier: ty Quantity: 1 Physician Procedures : CPT4 Code Description Modifier [id number] - WC PHYS LEVEL 3 - EST PT 25 ICD-10 Diagnosis Description L97.528 Non-pressure chronic ulcer of other part of left foot with other specified severity E11.621 Type 2 diabetes mellitus with foot ulcer Quantity: 1 : [id number] - WC PHYS DEBR WO ANESTH 20 SQ CM ICD-10 Diagnosis Description L97.528 Non-pressure chronic ulcer of other part of left foot with other specified severity Quantity: 1 Electronic Signature(s) Signed: 02/09/2022 10:02:59 AM By: [name] MD FACS Entered By: [name] on 02/09/2022 10:02:58

## 2022-02-09 NOTE — Progress Notes (Signed)
Shomaker, Max Max ([id number]) [id number] Page 1 of 9 Visit Report for 02/09/2022 Arrival Information Details Patient Name: Date of Service: Max Max 02/09/2022 8:15 A M Medical Record Number: [id number] Patient Account Number: [id number] Date of Birth/Sex: Treating RN: [name] (36 y.o. Max Max Primary Care Max Max: Max Max Other Clinician: Referring Jauna Raczynski: Treating Pegge Cumberledge/Extender: [name] in Treatment: 23 Visit Information History Since Last Visit Added or deleted any medications: No Patient Arrived: Ambulatory Any new allergies or adverse reactions: No Arrival Time: 08:26 Had a fall or experienced change in No Accompanied By: self activities of daily living that Max affect Transfer Assistance: None risk of falls: Patient Identification Verified: Yes Signs or symptoms of abuse/neglect since No Secondary Verification Process Completed: Yes last visito Patient Requires Transmission-Based Precautions: No Hospitalized since last visit: No Patient Has Alerts: No Implantable device outside of the clinic No excluding cellular tissue based products placed in the center since last visit: Has Dressing in Place as Prescribed: Yes Has Compression in Place as Prescribed: Yes Has Footwear/Offloading in Place as Yes Prescribed: Left: Surgical Shoe with Pressure Relief Insole Right: Surgical Shoe with Pressure Relief Insole Pain Present Now: No Electronic Signature(s) Signed: 02/09/2022 4:59:38 PM By: Max Gouty RN, BSN Entered By: Max Max on 02/09/2022 08:28:11 -------------------------------------------------------------------------------- Encounter Discharge Information Details Patient Name: Date of Service: Max Max, Max Max 02/09/2022 8:15 A M Medical Record Number: [id number] Patient Account Number: [id number] Date of Birth/Sex: Treating RN: [name] (36 y.o. Max Max Primary Care Orson Rho: Max Max Other Clinician: Referring Illyanna Petillo: Treating Syan Cullimore/Extender: [name] in Treatment: 119 Encounter Discharge Information Items Post Procedure Vitals Discharge Condition: Stable Temperature (F): 98.8 Ambulatory Status: Ambulatory Pulse (bpm): 83 Discharge Destination: Home Respiratory Rate (breaths/min): 18 Transportation: Private Auto Blood Pressure (mmHg): 140/96 Accompanied By: self Schedule Follow-up Appointment: Yes Clinical Summary of Care: Patient Declined Electronic Signature(s) Signed: 02/09/2022 4:59:38 PM By: Max Gouty RN, BSN Entered By: Max Max on 02/09/2022 10:05:08 [name], Max Max ([id number]) [id number] Page 2 of 9 -------------------------------------------------------------------------------- Lower Extremity Assessment Details Patient Name: Date of Service: Max Max 02/09/2022 8:15 A M Medical Record Number: [id number] Patient Account Number: [id number] Date of Birth/Sex: Treating RN: [name][birth year] (36 y.o. Max Max Primary Care Maliik Karner: Max Max Other Clinician: Referring Karmelo Bass: Treating Rumaisa Schnetzer/Extender: [name] in Treatment: 119 Edema Assessment Assessed: Max Max: No] Max Max: No] Edema: [Left: Yes] [Right: Yes] Calf Left: Right: Point of Measurement: 48 cm From Medial Instep 48 cm 48.5 cm Ankle Left: Right: Point of Measurement: 11 cm From Medial Instep 28.5 cm 27 cm Vascular Assessment Pulses: Dorsalis Pedis Palpable: [Left:Yes] [Right:Yes] Electronic Signature(s) Signed: 02/09/2022 4:59:38 PM By: Max Gouty RN, BSN Entered By: Max Max on 02/09/2022 08:38:49 -------------------------------------------------------------------------------- Multi Wound Chart Details Patient Name: Date of Service: Max Max, Max Max 02/09/2022 8:15 A M Medical Record Number:  [id number] Patient Account Number: [id number] Date of Birth/Sex: Treating RN: Max Max (36 y.o. M) Primary Care Laveta Gilkey: Max Max Other Clinician: Referring Sherrick Araki: Treating Lorey Pallett/Extender: [name] in Treatment: 119 Vital Signs Height(in): 77 Capillary Blood Glucose(mg/dl): 129 Weight(lbs): 280 Pulse(bpm): 93 Body Mass Index(BMI): 33.2 Blood Pressure(mmHg): 140/96 Temperature(F): 98.8 Respiratory Rate(breaths/min): 18 [3:Photos:] [N/A:N/A [id number] Page 3 of 9] Left, Lateral, Plantar Foot Right, Lateral Foot N/A Wound Location: Trauma Blister N/A Wounding Event: Diabetic Wound/Ulcer of the Lower Diabetic Wound/Ulcer of the Lower N/A Primary Etiology: Extremity Extremity Type II Diabetes Type  II Diabetes N/A Comorbid History: 10/02/2019 04/18/2021 N/A Date Acquired: 119 97 N/A Weeks of Treatment: Open Open N/A Wound Status: No No N/A Wound Recurrence: 3.8x3.5x1.1 0x0x0 N/A Measurements L x W x Max (cm) 10.446 0 N/A A (cm) : rea 11.49 0 N/A Volume (cm) : -533.50% 100.00% N/A % Reduction in A rea: -6863.60% 100.00% N/A % Reduction in Volume: 3 Starting Position 1 (o'clock): 7 Ending Position 1 (o'clock): 2.3 Maximum Distance 1 (cm): Yes No N/A Undermining: Grade 2 Grade 2 N/A Classification: Medium None Present N/A Exudate A mount: Serosanguineous N/A N/A Exudate Type: red, brown N/A N/A Exudate Color: Well defined, not attached N/A N/A Wound Margin: Large (67-100%) None Present (0%) N/A Granulation A mount: Red, Hyper-granulation N/A N/A Granulation Quality: Small (1-33%) None Present (0%) N/A Necrotic A mount: Fat Layer (Subcutaneous Tissue): Yes Fascia: No N/A Exposed Structures: Fascia: No Fat Layer (Subcutaneous Tissue): No Tendon: No Tendon: No Muscle: No Muscle: No Joint: No Joint: No Bone: No Bone: No Small (1-33%) Large (67-100%)  N/A Epithelialization: Debridement - Selective/Open Wound N/A N/A Debridement: Pre-procedure Verification/Time Out 09:05 N/A N/A Taken: Lidocaine 4% Topical Solution N/A N/A Pain Control: Callus N/A N/A Tissue Debrided: Skin/Epidermis N/A N/A Level: 3.8 N/A N/A Debridement A (sq cm): rea Curette N/A N/A Instrument: None N/A N/A Bleeding: 0 N/A N/A Procedural Pain: 0 N/A N/A Post Procedural Pain: Procedure was tolerated well N/A N/A Debridement Treatment Response: 3.8x3.5x1.1 N/A N/A Post Debridement Measurements L x W x Max (cm) 11.49 N/A N/A Post Debridement Volume: (cm) Callus: Yes Callus: Yes N/A Periwound Skin Texture: Maceration: Yes No Abnormalities Noted N/A Periwound Skin Moisture: No Abnormalities Noted No Abnormalities Noted N/A Periwound Skin Color: No Abnormality No Abnormality N/A Temperature: Debridement N/A N/A Procedures Performed: Treatment Notes Electronic Signature(s) Signed: 02/09/2022 9:52:02 AM By: [name] MD FACS Entered By: [name] on 02/09/2022 09:52:02 -------------------------------------------------------------------------------- Multi-Disciplinary Care Plan Details Patient Name: Date of Service: Max Max, Max Max 02/09/2022 8:15 A M Medical Record Number: [id number] Patient Account Number: [id number] Date of Birth/Sex: Treating RN: [name] (36 y.o. Max Max Primary Care Pierson Vantol: Max Max Other Clinician: Referring Grey Rakestraw: Treating Athel Merriweather/Extender: [name] in Treatment: [address] Max Max ([id number]) [id number] Page 4 of 9 Multidisciplinary Care Plan reviewed with physician Active Inactive Nutrition Nursing Diagnoses: Imbalanced nutrition Potential for alteratiion in Nutrition/Potential for imbalanced nutrition Goals: Patient/caregiver agrees to and verbalizes understanding of need to use nutritional supplements and/or vitamins as  prescribed Date Initiated: 10/24/2019 Date Inactivated: 04/06/2020 Target Resolution Date: 04/03/2020 Goal Status: Met Patient/caregiver will maintain therapeutic glucose control Date Initiated: 10/24/2019 Target Resolution Date: 02/16/2022 Goal Status: Active Interventions: Assess HgA1c results as ordered upon admission and as needed Assess patient nutrition upon admission and as needed per policy Provide education on elevated blood sugars and impact on wound healing Provide education on nutrition Treatment Activities: Education provided on Nutrition : 02/02/2022 Notes: 11/17/20: Glucose control ongoing issue, target date extended. 01/26/21: Glucose management continues. Wound/Skin Impairment Nursing Diagnoses: Impaired tissue integrity Knowledge deficit related to ulceration/compromised skin integrity Goals: Patient/caregiver will verbalize understanding of skin care regimen Date Initiated: 10/24/2019 Target Resolution Date: 02/16/2022 Goal Status: Active Ulcer/skin breakdown will have a volume reduction of 30% by week 4 Date Initiated: 10/24/2019 Date Inactivated: 01/16/2020 Target Resolution Date: 01/10/2020 Unmet Reason: no change in Goal Status: Unmet measurements. Interventions: Assess patient/caregiver ability to obtain necessary supplies Assess patient/caregiver ability to perform ulcer/skin care regimen upon admission and as needed Assess ulceration(s)  every visit Provide education on ulcer and skin care Notes: 11/17/20: Wound care regimen continues Electronic Signature(s) Signed: 02/09/2022 4:59:38 PM By: Max Gouty RN, BSN Entered By: Max Max on 02/09/2022 08:44:53 -------------------------------------------------------------------------------- Pain Assessment Details Patient Name: Date of Service: Max Max, Max Max 02/09/2022 8:15 A M Medical Record Number: [id number] Patient Account Number: [id number] Date of Birth/Sex: Treating RN: [name][birth year] (36 y.o.  Max Max Primary Care Tifini Reeder: Max Max Other Clinician: Referring Sundai Probert: Treating Daveigh Batty/Extender: [name] in Treatment: [address] Max Max ([id number]) [id number] Page 5 of 9 Active Problems Location of Pain Severity and Description of Pain Patient Has Paino No Site Locations Rate the pain. Current Pain Level: 0 Pain Management and Medication Current Pain Management: Electronic Signature(s) Signed: 02/09/2022 4:59:38 PM By: Max Gouty RN, BSN Entered By: Max Max on 02/09/2022 08:31:10 -------------------------------------------------------------------------------- Patient/Caregiver Education Details Patient Name: Date of Service: Max Max, Max Max 11/8/2023andnbsp8:15 A M Medical Record Number: [id number] Patient Account Number: [id number] Date of Birth/Gender: Treating RN: [birth year] [name] (36 y.o. Max Max Primary Care Physician: Max Max Other Clinician: Referring Physician: Treating Physician/Extender: [name] in Treatment: 119 Education Assessment Education Provided To: Patient Education Topics Provided Elevated Blood Sugar/ Impact on Healing: Methods: Explain/Verbal Responses: Reinforcements needed, State content correctly Offloading: Methods: Explain/Verbal Responses: Reinforcements needed, State content correctly Wound/Skin Impairment: Methods: Explain/Verbal Responses: Reinforcements needed, State content correctly Electronic Signature(s) Signed: 02/09/2022 4:59:38 PM By: Max Gouty RN, BSN Entered By: Max Max on 02/09/2022 08:45:27 [place], Max Max ([id number]) [id number] Page 6 of 9 -------------------------------------------------------------------------------- Wound Assessment Details Patient Name: Date of Service: Max Max 02/09/2022 8:15 A M Medical Record Number:  [id number] Patient Account Number: [id number] Date of Birth/Sex: Treating RN: [birth year] [name] (36 y.o. Max Max Primary Care Samwise Eckardt: Max Max Other Clinician: Referring Bow Buntyn: Treating Morrison Masser/Extender: [name] in Treatment: 119 Wound Status Wound Number: 3 Primary Etiology: Diabetic Wound/Ulcer of the Lower Extremity Wound Location: Left, Lateral, Plantar Foot Wound Status: Open Wounding Event: Trauma Comorbid History: Type II Diabetes Date Acquired: 10/02/2019 Weeks Of Treatment: 119 Clustered Wound: No Photos Wound Measurements Length: (cm) 3.8 Width: (cm) 3.5 Depth: (cm) 1.1 Area: (cm) 10.446 Volume: (cm) 11.49 % Reduction in Area: -533.5% % Reduction in Volume: -6863.6% Epithelialization: Small (1-33%) Undermining: Yes Starting Position (o'clock): 3 Ending Position (o'clock): 7 Maximum Distance: (cm) 2.3 Wound Description Classification: Grade 2 Wound Margin: Well defined, not attached Exudate Amount: Medium Exudate Type: Serosanguineous Exudate Color: red, brown Foul Odor After Cleansing: No Slough/Fibrino Yes Wound Bed Granulation Amount: Large (67-100%) Exposed Structure Granulation Quality: Red, Hyper-granulation Fascia Exposed: No Necrotic Amount: Small (1-33%) Fat Layer (Subcutaneous Tissue) Exposed: Yes Necrotic Quality: Adherent Slough Tendon Exposed: No Muscle Exposed: No Joint Exposed: No Bone Exposed: No Periwound Skin Texture Texture Color No Abnormalities Noted: No No Abnormalities Noted: Yes Callus: Yes Temperature / Pain Temperature: No Abnormality Moisture No Abnormalities Noted: No Maceration: Yes [place], Max Max ([id number]) [phone].pdf Page 7 of 9 Treatment Notes Wound #3 (Foot) Wound Laterality: Plantar, Left, Lateral Cleanser Soap and Water Discharge Instruction: Max shower and wash wound with dial antibacterial soap and water prior to dressing  change. Peri-Wound Care Zinc Oxide Ointment 30g tube Discharge Instruction: Apply Zinc Oxide to periwound with each dressing change Sween Lotion (Moisturizing lotion) Discharge Instruction: Apply moisturizing lotion as directed Topical Primary Dressing Hydrofera Blue Ready Foam, 4x5 in Discharge Instruction: Apply to wound bed as instructed Secondary Dressing ABD Pad, 8x10 Discharge  Instruction: Apply over primary dressing as directed. Optifoam Non-Adhesive Dressing, 4x4 in Discharge Instruction: Apply over primary dressing as directed. Woven Gauze Sponge, Non-Sterile 4x4 in Discharge Instruction: Apply over primary dressing as directed. Zetuvit Absorbent Pad, 4x4 (in/in) Secured With 85M Medipore H Soft Cloth Surgical T ape, 4 x 10 (in/yd) Discharge Instruction: Secure with tape as directed. Compression Wrap Kerlix Roll 4.5x3.1 (in/yd) Discharge Instruction: Apply Kerlix and Coban compression as directed. 85M ACE Elastic Bandage With VELCRO Brand Closure, 4 (in) Compression Stockings Add-Ons Notes foam donut to right lateral foot Electronic Signature(s) Signed: 02/09/2022 8:53:47 AM By: Worthy Rancher Signed: 02/09/2022 4:59:38 PM By: Max Gouty RN, BSN Entered By: Worthy Rancher on 02/09/2022 08:50:22 -------------------------------------------------------------------------------- Wound Assessment Details Patient Name: Date of Service: Max Max, Max Max 02/09/2022 8:15 A M Medical Record Number: [id number] Patient Account Number: [id number] Date of Birth/Sex: Treating RN: [birth year] (36 y.o. Max Max Primary Care Tauno Falotico: Max Max Other Clinician: Referring Markcus Lazenby: Treating Ahliyah Nienow/Extender: [name] in Treatment: 119 Wound Status Wound Number: 8 Primary Etiology: Diabetic Wound/Ulcer of the Lower Extremity Wound Location: Right, Lateral Foot Wound Status: Open Wounding Event: Blister Comorbid History: Type II  Diabetes Date Acquired: 04/18/2021 Weeks Of Treatment: 42 Clustered Wound: No [place], Max Max ([id number]) [id number] Page 8 of 9 Photos Wound Measurements Length: (cm) Width: (cm) Depth: (cm) Area: (cm) Volume: (cm) 0 % Reduction in Area: 100% 0 % Reduction in Volume: 100% 0 Epithelialization: Large (67-100%) 0 Tunneling: No 0 Undermining: No Wound Description Classification: Grade 2 Exudate Amount: None Present Foul Odor After Cleansing: No Slough/Fibrino No Wound Bed Granulation Amount: None Present (0%) Exposed Structure Necrotic Amount: None Present (0%) Fascia Exposed: No Fat Layer (Subcutaneous Tissue) Exposed: No Tendon Exposed: No Muscle Exposed: No Joint Exposed: No Bone Exposed: No Periwound Skin Texture Texture Color No Abnormalities Noted: No No Abnormalities Noted: Yes Callus: Yes Temperature / Pain Temperature: No Abnormality Moisture No Abnormalities Noted: Yes Electronic Signature(s) Signed: 02/09/2022 8:53:47 AM By: Worthy Rancher Signed: 02/09/2022 4:59:38 PM By: Max Gouty RN, BSN Entered By: Worthy Rancher on 02/09/2022 08:51:22 -------------------------------------------------------------------------------- Vitals Details Patient Name: Date of Service: Max Max, Max Max 02/09/2022 8:15 A M Medical Record Number: [id number] Patient Account Number: [id number] Date of Birth/Sex: Treating RN: [birth year], [name] (36 y.o. Max Max Primary Care Brett Darko: Max Max Other Clinician: Referring Demetrick Eichenberger: Treating Culver Feighner/Extender: [name] in Treatment: 119 Vital Signs Time Taken: 08:29 Temperature (F): 98.8 Height (in): 77 Pulse (bpm): 93 Weight (lbs): 280 Respiratory Rate (breaths/min): 18 Body Mass Index (BMI): 33.2 Blood Pressure (mmHg): 140/96 Capillary Blood Glucose (mg/dl): 129 Reference Range: 80 - 120 mg / dl [place], Max Max ([id number])  [id number] Page 9 of 9 Notes glucose per pt report this am Electronic Signature(s) Signed: 02/09/2022 4:59:38 PM By: Max Gouty RN, BSN Entered By: Max Max on 02/09/2022 08:30:31

## 2022-02-11 ENCOUNTER — Encounter (HOSPITAL_BASED_OUTPATIENT_CLINIC_OR_DEPARTMENT_OTHER): Payer: BC Managed Care – PPO | Admitting: General Surgery

## 2022-02-11 DIAGNOSIS — E11621 Type 2 diabetes mellitus with foot ulcer: Secondary | ICD-10-CM | POA: Diagnosis not present

## 2022-02-14 ENCOUNTER — Encounter (HOSPITAL_BASED_OUTPATIENT_CLINIC_OR_DEPARTMENT_OTHER): Payer: BC Managed Care – PPO | Admitting: General Surgery

## 2022-02-14 DIAGNOSIS — E11621 Type 2 diabetes mellitus with foot ulcer: Secondary | ICD-10-CM | POA: Diagnosis not present

## 2022-02-14 NOTE — Progress Notes (Signed)
[place], [name] ([id number]) [id number] Page 1 of 1 Visit Report for 02/11/2022 SuperBill Details Patient Name: Date of Service: [name] 02/11/2022 Medical Record Number: [id number] Patient Account Number: [id number] Date of Birth/Sex: Treating RN: 1986-03-07 (36 y.o. 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

## 2022-02-14 NOTE — Progress Notes (Signed)
[place], [name] ([id number]) [id number] Page 1 of 4 Visit Report for 02/11/2022 Arrival Information Details Patient Name: Date of Service: [name] 02/11/2022 8:15 Max M Medical Record Number: [id number] Patient Account Number: [id number] Date of Birth/Sex: Treating RN: 10-12-1985 (36 y.o. [name] Primary Care Srihith Aquilino: [name] Other Clinician: Referring Trica Usery: Treating Catalina Salasar/Extender: [name] in Treatment: 120 Visit Information History Since Last Visit Added or deleted any medications: No Patient Arrived: Ambulatory Any new allergies or adverse reactions: No Arrival Time: 08:25 Had Max fall or experienced change in No Accompanied By: fiance activities of daily living that may affect Transfer Assistance: None risk of falls: Patient Identification Verified: Yes Signs or symptoms of abuse/neglect since No Secondary Verification Process Completed: Yes last visito Patient Requires Transmission-Based Precautions: No Hospitalized since last visit: No Patient Has Alerts: No Implantable device outside of the clinic No excluding cellular tissue based products placed in the center since last visit: Has Dressing in Place as Prescribed: Yes Has Compression in Place as Prescribed: Yes Has Footwear/Offloading in Place as Yes Prescribed: Left: Surgical Shoe with Pressure Relief Insole Right: Surgical Shoe with Pressure Relief Insole Pain Present Now: No Electronic Signature(s) Signed: 02/14/2022 12:51:09 PM By: Zenaida Deed RN, BSN Entered By: Zenaida Deed on 02/11/2022 08:28:39 -------------------------------------------------------------------------------- Encounter Discharge Information Details Patient Name: Date of Service: [name], [name] 02/11/2022 8:15 Max M Medical Record Number: [id number] Patient Account Number: [id number] Date of Birth/Sex: Treating RN: 1986/01/22 (36 y.o. [name] Primary Care Deonna Krummel: [name] Other Clinician: Referring Ngoc Daughtridge: Treating Iyania Denne/Extender: [name] in Treatment: 120 Encounter Discharge Information Items Discharge Condition: Stable Ambulatory Status: Wheelchair Discharge Destination: Home Transportation: Private Auto Accompanied By: fiance Schedule Follow-up Appointment: Yes Clinical Summary of Care: Patient Declined Electronic Signature(s) Signed: 02/14/2022 12:51:09 PM By: Zenaida Deed RN, BSN Entered By: Zenaida Deed on 02/11/2022 09:08:30 [place], [name] ([id number]) [id number] Page 2 of 4 -------------------------------------------------------------------------------- Negative Pressure Wound Therapy Application (NPWT) Details Patient Name: Date of Service: [name] 02/11/2022 8:15 Max M Medical Record Number: [id number] Patient Account Number: [id number] Date of Birth/Sex: Treating RN: 08/14/1985 (36 y.o. [name] Primary Care Kambre Messner: [name] Other Clinician: Referring Britainy Kozub: Treating Knight Oelkers/Extender: [name] in Treatment: 120 NPWT Application Performed for: Wound #3 Left, Lateral, Plantar Foot Performed By: Zenaida Deed, RN Type: VAC System Coverage Size (sq cm): 13.3 Pressure Type: Constant Pressure Setting: 125 mmHG Drain Type: None Quantity of Sponges/Gauze Inserted: 1 Sponge/Dressing Type: Foam, Black Date Initiated: 02/11/2022 Response to Treatment: good, explain dressing and machine to pt and fiance Electronic Signature(s) Signed: 02/14/2022 12:51:09 PM By: Zenaida Deed RN, BSN Entered By: Zenaida Deed on 02/11/2022 09:06:42 -------------------------------------------------------------------------------- Patient/Caregiver Education Details Patient Name: Date of Service: [name], [name] 11/10/2023andnbsp8:15 Max M Medical Record Number:  [id number] Patient Account Number: [id number] Date of Birth/Gender: Treating RN: Nov 06, 1985 (36 y.o. [name] Primary Care Physician: [name] Other Clinician: Referring Physician: Treating Physician/Extender: [name] in Treatment: 120 Education Assessment Education Provided To: Patient Education Topics Provided Elevated Blood Sugar/ Impact on Healing: Methods: Explain/Verbal Responses: Reinforcements needed, State content correctly Wound/Skin Impairment: Methods: Explain/Verbal Responses: Reinforcements needed, State content correctly Electronic Signature(s) Signed: 02/14/2022 12:51:09 PM By: Zenaida Deed RN, BSN Entered By: Zenaida Deed on 02/11/2022 09:08:13 [place], [name] ([id number]) [id number] Page 3 of 4 -------------------------------------------------------------------------------- Wound Assessment Details Patient Name: Date of Service: [name], Max  [name] 02/11/2022 8:15 Max M Medical Record Number: [id number] Patient Account Number: [id number] Date of Birth/Sex: Treating RN: 11-16-1985 (36 y.o. [name] Primary Care Marieanne Marxen: [name] Other Clinician: Referring Artemio Dobie: Treating Jonh Mcqueary/Extender: [name] in Treatment: 120 Wound Status Wound Number: 3 Primary Etiology: Diabetic Wound/Ulcer of the Lower Extremity Wound Location: Left, Lateral, Plantar Foot Wound Status: Open Wounding Event: Trauma Date Acquired: 10/02/2019 Weeks Of Treatment: 120 Clustered Wound: No Wound Measurements Length: (cm) 3.8 Width: (cm) 3.5 Depth: (cm) 1.1 Area: (cm) 10.446 Volume: (cm) 11.49 % Reduction in Area: -533.5% % Reduction in Volume: -6863.6% Wound Description Classification: Grade 2 Exudate Amount: Medium Exudate Type: Serosanguineous Exudate Color: red, brown Periwound Skin Texture Texture Color No Abnormalities Noted: No No Abnormalities  Noted: No Moisture No Abnormalities Noted: No Treatment Notes Wound #3 (Foot) Wound Laterality: Plantar, Left, Lateral Cleanser Soap and Water Discharge Instruction: May shower and wash wound with dial antibacterial soap and water prior to dressing change. Peri-Wound Care Zinc Oxide Ointment 30g tube Discharge Instruction: Apply Zinc Oxide to periwound with each dressing change Sween Lotion (Moisturizing lotion) Discharge Instruction: Apply moisturizing lotion as directed Topical Primary Dressing wound VAC Secondary Dressing Secured With Compression Wrap Kerlix Roll 4.5x3.1 (in/yd) Discharge Instruction: Apply Kerlix and Coban compression as directed. 73M ACE Elastic Bandage With VELCRO Brand Closure, 4 (in) Compression Stockings Add-Ons Electronic Signature(s) [place], [name] ([id number]) [id number] Page 4 of 4 Signed: 02/14/2022 12:51:09 PM By: Zenaida Deed RN, BSN Entered By: Zenaida Deed on 02/11/2022 08:29:46 -------------------------------------------------------------------------------- Vitals Details Patient Name: Date of Service: [name], [name] 02/11/2022 8:15 Max M Medical Record Number: [id number] Patient Account Number: [id number] Date of Birth/Sex: Treating RN: 1986-04-04 (36 y.o. [name] Primary Care Amena Dockham: [name] Other Clinician: Referring Duyen Beckom: Treating Margrette Wynia/Extender: [name] in Treatment: 120 Vital Signs Time Taken: 08:28 Temperature (F): 99.1 Height (in): 77 Pulse (bpm): 83 Weight (lbs): 280 Respiratory Rate (breaths/min): 18 Body Mass Index (BMI): 33.2 Blood Pressure (mmHg): 138/91 Capillary Blood Glucose (mg/dl): 211 Reference Range: 80 - 120 mg / dl Notes glucose per pt's dexcom at present Electronic Signature(s) Signed: 02/14/2022 12:51:09 PM By: Zenaida Deed RN, BSN Entered By: Zenaida Deed on 02/11/2022 08:30:21

## 2022-02-14 NOTE — Progress Notes (Signed)
[place], [name] ([id number]) [id number] Page 1 of 4 Visit Report for 02/14/2022 Arrival Information Details Patient Name: Date of Service: [name] RMSTRO NG, [name] 02/14/2022 11:15 A M Medical Record Number: [id number] Patient Account Number: [id number] Date of Birth/Sex: Treating RN: December 15, 1985 (36 y.o. [name]) Boehlein, Linda Primary Care Dolph Tavano: Renford DillsPolite, Ronald Other Clinician: Referring Kilian Schwartz: Treating Grady Mohabir/Extender: Layla Mawannon, Jennifer [name], Ronald Weeks in Treatment: 120 Visit Information History Since Last Visit Added or deleted any medications: No Patient Arrived: Ambulatory Any new allergies or adverse reactions: No Arrival Time: 11:20 Had a fall or experienced change in No Accompanied By: fiance activities of daily living that may affect Transfer Assistance: None risk of falls: Patient Identification Verified: Yes Signs or symptoms of abuse/neglect since No Secondary Verification Process Completed: Yes last visito Patient Requires Transmission-Based Precautions: No Hospitalized since last visit: No Patient Has Alerts: No Implantable device outside of the clinic No excluding cellular tissue based products placed in the center since last visit: Has Dressing in Place as Prescribed: Yes Has Compression in Place as Prescribed: Yes Has Footwear/Offloading in Place as Yes Prescribed: Left: Surgical Shoe with Pressure Relief Insole Pain Present Now: No Electronic Signature(s) Signed: 02/14/2022 12:51:09 PM By: Zenaida DeedBoehlein, Linda RN, BSN Entered By: Zenaida DeedBoehlein, Linda on 02/14/2022 11:21:23 -------------------------------------------------------------------------------- Encounter Discharge Information Details Patient Name: Date of Service: [name] RMSTRO NG, [name] 02/14/2022 11:15 A M Medical Record Number: [id number] Patient Account Number: [id number] Date of Birth/Sex: Treating RN: December 15, 1985 (36 y.o. [name]) Boehlein, Linda Primary Care Kazumi Lachney: Renford DillsPolite, Ronald  Other Clinician: Referring Jochebed Bills: Treating Atiba Kimberlin/Extender: Layla Mawannon, Jennifer [name], Ronald Weeks in Treatment: 120 Encounter Discharge Information Items Discharge Condition: Stable Ambulatory Status: Ambulatory Discharge Destination: Home Transportation: Private Auto Accompanied By: fiance Schedule Follow-up Appointment: Yes Clinical Summary of Care: Patient Declined Electronic Signature(s) Signed: 02/14/2022 12:51:09 PM By: Zenaida DeedBoehlein, Linda RN, BSN Entered By: Zenaida DeedBoehlein, Linda on 02/14/2022 11:57:44 [place], [name] ([id number]) [id number] Page 2 of 4 -------------------------------------------------------------------------------- Negative Pressure Wound Therapy Maintenance (NPWT) Details Patient Name: Date of Service: [name] RMSTRO NG, [name] 02/14/2022 11:15 A M Medical Record Number: [id number] Patient Account Number: [id number] Date of Birth/Sex: Treating RN: December 15, 1985 (36 y.o. [name]) Boehlein, Linda Primary Care Virdie Penning: Renford DillsPolite, Ronald Other Clinician: Referring Eudell Mcphee: Treating Gavynn Duvall/Extender: Layla Mawannon, Jennifer [name], Ronald Weeks in Treatment: 120 NPWT Maintenance Performed for: Wound #3 Left, Lateral, Plantar Foot Performed By: Zenaida DeedBoehlein, Linda, RN Type: VAC System Coverage Size (sq cm): 13.3 Pressure Type: Constant Pressure Setting: 125 mmHG Drain Type: None Sponge/Dressing Type: Foam, Black Date Initiated: 02/11/2022 Dressing Removed: No Quantity of Sponges/Gauze Removed: 1 Canister Changed: No Dressing Reapplied: No Quantity of Sponges/Gauze Inserted: 1 Respones T Treatment: o good Days On NPWT : 4 Electronic Signature(s) Signed: 02/14/2022 12:51:09 PM By: Zenaida DeedBoehlein, Linda RN, BSN Entered By: Zenaida DeedBoehlein, Linda on 02/14/2022 11:50:37 -------------------------------------------------------------------------------- Patient/Caregiver Education Details Patient Name: Date of Service: [name] RMSTRO NG, [name] 11/13/2023andnbsp11:15 A M Medical  Record Number: [id number] Patient Account Number: [id number] Date of Birth/Gender: Treating RN: December 15, 1985 (36 y.o. [name]) Boehlein, Linda Primary Care Physician: Renford DillsPolite, Ronald Other Clinician: Referring Physician: Treating Physician/Extender: Layla Mawannon, Jennifer [name], Ronald Weeks in Treatment: 120 Education Assessment Education Provided To: Patient Education Topics Provided Offloading: Methods: Explain/Verbal Responses: Reinforcements needed, State content correctly Wound/Skin Impairment: Methods: Explain/Verbal Responses: Reinforcements needed, State content correctly Electronic Signature(s) Signed: 02/14/2022 12:51:09 PM By: Zenaida DeedBoehlein, Linda RN, BSN Entered By: Zenaida DeedBoehlein, Linda on 02/14/2022 11:57:20 [place], [name] ([id number]) [id number] Page 3 of 4 -------------------------------------------------------------------------------- Wound Assessment Details Patient Name: Date of Service: [name], [name]  D 02/14/2022 11:15 A M Medical Record Number: [id number] Patient Account Number: [id number] Date of Birth/Sex: Treating RN: 01-18-1986 (36 y.o. Max Schooner Primary Care Iran Rowe: [name] Other Clinician: Referring Hue Steveson: Treating Lamoyne Palencia/Extender: [name] in Treatment: 120 Wound Status Wound Number: 3 Primary Etiology: Diabetic Wound/Ulcer of the Lower Extremity Wound Location: Left, Lateral, Plantar Foot Wound Status: Open Wounding Event: Trauma Date Acquired: 10/02/2019 Weeks Of Treatment: 120 Clustered Wound: No Wound Measurements Length: (cm) 3.8 Width: (cm) 3.5 Depth: (cm) 1.1 Area: (cm) 10.446 Volume: (cm) 11.49 % Reduction in Area: -533.5% % Reduction in Volume: -6863.6% Wound Description Classification: Grade 2 Exudate Amount: Medium Exudate Type: Serosanguineous Exudate Color: red, brown Periwound Skin Texture Texture Color No Abnormalities Noted: No No Abnormalities Noted:  No Moisture No Abnormalities Noted: No Treatment Notes Wound #3 (Foot) Wound Laterality: Plantar, Left, Lateral Cleanser Soap and Water Discharge Instruction: May shower and wash wound with dial antibacterial soap and water prior to dressing change. Peri-Wound Care Skin Prep Discharge Instruction: Use skin prep as directed Topical Primary Dressing VAC Secondary Dressing Secured With Compression Wrap Kerlix Roll 4.5x3.1 (in/yd) Discharge Instruction: Apply Kerlix and Coban compression as directed. Compression Stockings Add-Ons Notes pt's fiance able to demonstrate proper application of VAC dressing [place], [name] ([id number]) [id number] Page 4 of 4 Electronic Signature(s) Signed: 02/14/2022 12:51:09 PM By: Zenaida Deed RN, BSN Entered By: Zenaida Deed on 02/14/2022 11:45:14 -------------------------------------------------------------------------------- Vitals Details Patient Name: Date of Service: Max Fick, [name] 02/14/2022 11:15 A M Medical Record Number: [id number] Patient Account Number: [id number] Date of Birth/Sex: Treating RN: [birth year] (36 y.o. Max Schooner Primary Care Cherolyn Behrle: [name] Other Clinician: Referring Muskaan Smet: Treating Cameo Schmiesing/Extender: [name] in Treatment: 120 Vital Signs Time Taken: 11:21 Temperature (F): 98 Height (in): 77 Pulse (bpm): 97 Weight (lbs): 280 Respiratory Rate (breaths/min): 18 Body Mass Index (BMI): 33.2 Blood Pressure (mmHg): 144/87 Capillary Blood Glucose (mg/dl): 852 Reference Range: 80 - 120 mg / dl Notes glucose per pt's dexcom at present Electronic Signature(s) Signed: 02/14/2022 12:51:09 PM By: Zenaida Deed RN, BSN Entered By: Zenaida Deed on 02/14/2022 11:22:12

## 2022-02-14 NOTE — Progress Notes (Signed)
[place], [name] ([id number]) [id number] Page 1 of 1 Visit Report for 02/14/2022 SuperBill Details Patient Name: Date of Service: [name] 02/14/2022 Medical Record Number: [id number] Patient Account Number: [id number] Date of Birth/Sex: Treating RN: April 28, 1985 (36 y.o. 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

## 2022-02-16 ENCOUNTER — Encounter (HOSPITAL_BASED_OUTPATIENT_CLINIC_OR_DEPARTMENT_OTHER): Payer: BC Managed Care – PPO | Admitting: General Surgery

## 2022-02-16 DIAGNOSIS — E11621 Type 2 diabetes mellitus with foot ulcer: Secondary | ICD-10-CM | POA: Diagnosis not present

## 2022-02-17 ENCOUNTER — Other Ambulatory Visit: Payer: Self-pay | Admitting: Internal Medicine

## 2022-02-17 DIAGNOSIS — R935 Abnormal findings on diagnostic imaging of other abdominal regions, including retroperitoneum: Secondary | ICD-10-CM

## 2022-02-17 NOTE — Progress Notes (Signed)
Abe, [name] ([id number]) [id number] Page 1 of 21 Visit Report for 02/16/2022 Chief Complaint Document Details Patient Name: Date of Service: [name] 02/16/2022 8:15 A M Medical Record Number: [id number] Patient Account Number: [id number] Date of Birth/Sex: Treating RN: [birth year] (36 y.o. M) Primary Care Provider: [name] Other Clinician: Referring Provider: Treating Provider/Extender: [name] in Treatment: 120 Information Obtained from: Patient Chief Complaint 01/11/2019; patient is here for review of a rather substantial wound over the left fifth plantar metatarsal head extending into the lateral part of his foot 10/24/2019; patient returns to clinic with wounds on his bilateral feet with underlying osteomyelitis biopsy-proven Electronic Signature(s) Signed: 02/16/2022 9:07:41 AM By: [name] MD FACS Entered By: [name] on 02/16/2022 09:07:41 -------------------------------------------------------------------------------- Debridement Details Patient Name: Date of Service: [name], [name] 02/16/2022 8:15 A M Medical Record Number: [id number] Patient Account Number: [id number] Date of Birth/Sex: Treating RN: 1985/04/19 (36 y.o. [name] Primary Care Provider: [name] Other Clinician: Referring Provider: Treating Provider/Extender: [name] in Treatment: 120 Debridement Performed for Assessment: Wound #3 Left,Lateral,Plantar Foot Performed By: Physician [name], MD Debridement Type: Debridement Severity of Tissue Pre Debridement: Fat layer exposed Level of Consciousness (Pre-procedure): Awake and Alert Pre-procedure Verification/Time Out Yes - 08:50 Taken: Start Time: 08:53 T Area Debrided (L x W): otal 3 (cm) x 1 (cm) = 3 (cm) Tissue and other material debrided: Non-Viable, Skin: Epidermis Level: Skin/Epidermis Debridement  Description: Selective/Open Wound Instrument: Curette Bleeding: Minimum Hemostasis Achieved: Pressure Procedural Pain: 0 Post Procedural Pain: 0 Response to Treatment: Procedure was tolerated well Level of Consciousness (Post- Awake and Alert procedure): Post Debridement Measurements of Total Wound Length: (cm) 3 Width: (cm) 3 Depth: (cm) 0.7 Volume: (cm) 4.948 Character of Wound/Ulcer Post Debridement: Improved Severity of Tissue Post Debridement: Fat layer exposed [place], [name] ([id number]) [id number] Page 2 of 21 Post Procedure Diagnosis Same as Pre-procedure Notes scribed by Baruch Gouty, RN for Dr. [name] Electronic Signature(s) Signed: 02/16/2022 5:37:22 PM By: Baruch Gouty RN, BSN Signed: 02/17/2022 7:46:48 AM By: [name] MD FACS Previous Signature: 02/16/2022 9:48:09 AM Version By: [name] MD FACS Entered By: Baruch Gouty on 02/16/2022 17:35:28 -------------------------------------------------------------------------------- HPI Details Patient Name: Date of Service: [name], [name] 02/16/2022 8:15 A M Medical Record Number: [id number] Patient Account Number: [id number] Date of Birth/Sex: Treating RN: 10-08-1985 (36 y.o. 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 He comes in for review of a wound over the plantar fifth metatarsal head extending into the lateral part of the foot. He was followed for this previously by his podiatrist Dr. Cornelius Moras. As the patient tells his story he went to see podiatry first for a swelling he developed on the lateral part of his fifth metatarsal head in May. He states this was "open" by podiatry and the area closed. He  was followed up in June and it was again opened callus removed and it closed promptly. There were plans being made for surgery on the fifth metatarsal head in June however his blood sugar was apparently too high for anesthesia. Apparently the area was debrided and opened again in June and it is never closed since. Looking over the records from podiatry I am really not able to follow this.  It was clear when he was first seen it was before 5/14 at that point he already had a wound. By 5/17 the ulcer was resolved. I do not see anything about a procedure. On 5/28 noted to have pre-ulcerative moderate keratosis. X-ray noted 1/5 contracted toe and tailor's bunion and metatarsal deformity. On a visit date on 09/28/2018 the dorsal part of the left foot it healed and resolved. There was concern about swelling in his lower extremity he was sent to the ER.. As far as I can tell he was seen in the ER on 7/12 with an ulcer on his left foot. A DVT rule out of the left leg was negative. I do not think I have complete records from podiatry but I am not able to verify the procedures this patient states he had. He states after the last procedure the wound has never closed although I am not able to follow this in the records I have from podiatry. He has not had a recent x-ray The patient has been using Neosporin on the wound. He is wearing a Darco shoe. He is still very active up on his foot working and exercising. Past medical history; type 2 diabetes ketosis-prone, leg swelling with a negative DVT study in July. Non-smoker ABI in our clinic was 0.85 on the left 10/16; substantial wound on the plantar left fifth met head extending laterally almost to the dorsal fifth MTP. We have been using silver alginate we gave him a Darco forefoot off loader. An x-ray did not show evidence of osteomyelitis did note soft tissue emphysema which I think was due to gas tracking through an open wound. There is no doubt in my mind he  requires an MRI 10/23; MRI not booked until 3 November at the earliest this is largely due to his glucose sensor in the right arm. We have been using silver alginate. There has been an improvement 10/29; I am still not exactly sure when his MRI is booked for. He says it is the third but it is the 10th in epic. This definitely needs to be done. He is running a low-grade fever today but no other symptoms. No real improvement in the 1 02/26/2019 patient presents today for a follow-up visit here in our clinic he is last been seen in the clinic on October 29. Subsequently we were working on getting MRI to evaluate and see what exactly was going on and where we would need to go from the standpoint of whether or not he had osteomyelitis and again what treatments were going be required. Subsequently the patient ended up being admitted to the hospital on 02/07/2019 and was discharged on 02/14/2019. This is a somewhat interesting admission with a discharge diagnosis of pneumonia due to COVID-19 although he was positive for COVID-19 when tested at the urgent care but negative x2 when he was actually in the hospital. With that being said he did have acute respiratory failure with hypoxia and it was noted he also have a left foot ulceration with osteomyelitis. With that being said he did require oxygen for his pneumonia and I level 4 L. He was placed on antivirals and steroids for the COVID-19. He was also transferred to the [facility] at one point. Nonetheless he did subsequently discharged home and since being home has done much better in that regard. The CT angiogram did not show any pulmonary embolism. With regard to the osteomyelitis the patient was placed on vancomycin and Zosyn while in the  hospital but has been changed to Augmentin at discharge. It was also recommended that he follow- up with wound care and podiatry. Podiatry however wanted him to see [name] according to the patient prior to them doing  anything further. His hemoglobin A1c was 9.9 as noted in the hospital. Have an MRI of the left foot performed while in the hospital on 02/04/2019. 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 There is an overlying plantar open wound noted an abscess tracking back along the lateral aspect of the fifth metatarsal shaft. There is otherwise diffuse cellulitis and mild fasciitis without findings of polymyositis. The patient did have recently pneumonia secondary to COVID-19 I looked in the chart through epic and it does appear that the patient may need to have an additional x-ray just to ensure everything is cleared and that he has no airspace disease prior to putting him into the chamber. 03/05/2019; patient was readmitted to the clinic last week. He was hospitalized twice for a viral upper respiratory tract infection from 11/1 through 11/4 and then 11/5 through 11/12 ultimately this turned out to be Covid pneumonitis. Although he was discharged on oxygen he is not using it. He says he feels fine. He has no exercise limitation no cough no sputum. His O2 sat in our clinic today was 100% on room air. 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 He received Vanco and Zosyn in the hospital and then was discharged on 2 weeks of Augmentin. I do not see any relevant cultures. He was supposed to [place], [name] ([id number]) [id number] Page 3 of 21 follow-up with infectious disease but I do not see that he has an appointment. 12/8; patient saw Dr. Novella Olive of infectious disease last week. He felt that he had had adequate antibiotic therapy. He did not go to follow-up with Dr. Amalia Hailey of podiatry and I have again talked to him about the pros and cons of this. He does not want to consider a  ray amputation of this time. He is aware of the risks of recurrence, migration etc. He started HBO today and tolerated this well. He can complete the Augmentin that I gave him last week. I have looked over the lab work that Dr. Chana Bode ordered his C-reactive protein was 3.3 and his sedimentation rate was 17. The C-reactive protein is never really been measurably that high in this patient 12/15; not much change in the wound today however he has undermining along the lateral part of the foot again more extensively than last week. He has some rims of epithelialization. We have been using silver alginate. He is undergoing hyperbarics but did not dive today 12/18; in for his obligatory first total contact cast change. Unfortunately there was pus coming from the undermining area around his fifth metatarsal head. This was cultured but will preclude reapplication of a cast. He is seen in conjunction with HBO 12/24; patient had staph lugdunensis in the wound in the undermining area laterally last time. We put him on doxycycline which should have covered this. The wound looks better today. I am going to give him another week of doxycycline before reattempting the total contact cast 12/31; the patient is completing antibiotics. Hemorrhagic debris in the distal part of the wound with some undermining distally. He also had hyper granulation. Extensive debridement with a #  5 curette. The infected area that was on the lateral part of the fifth met head is closed over. I do not think he needs any more antibiotics. Patient was seen prior to HBO. Preparations for a total contact cast were made in the cast will be placed post hyperbarics 04/11/19; once again the patient arrives today without complaint. He had been in a cast all week noted that he had heavy drainage this week. This resulted in large raised areas of macerated tissue around the wound 1/14; wound bed looks better slightly smaller. Hydrofera Blue has been  changing himself. He had a heavy drainage last week which caused a lot of maceration around the wound so I took him out of a total contact cast he says the drainage is actually better this week He is seen today in conjunction with HBO 1/21; returns to clinic. He was up in [place] for a day or 2 attending a funeral. He comes back in with the wound larger and with a large area of exposed bone. He had osteomyelitis and septic arthritis of the fifth left metatarsal head while he was in hospital. He received IV antibiotics in the hospital for a prolonged period of time then 3 weeks of Augmentin. Subsequently I gave him 2 weeks of doxycycline for more superficial wound infection. When I saw this last week the wound was smaller the surface of the wound looks satisfactory. 1/28; patient missed hyperbarics today. Bone biopsy I did last time showed Enterococcus faecalis and Staphylococcus lugdunensis . He has a wide area of exposed bone. We are going to use silver alginate as of today. I had another ethical discussion with the patient. 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 In this situation I think the likelihood of healing this is low. Therefore I have recommended a ray amputation and with the patient's agreement I have referred him to Dr. Doran Durand. The other issue is that his compliance with hyperbarics has been minimal because of his work schedule and given his underlying decision I am going to stop this today READMISSION 10/24/2019 MRI 09/29/2019 left foot IMPRESSION: 1. Apparent skin ulceration inferior and lateral to the 5th metatarsal base with underlying heterogeneous T2 signal and enhancement in the subcutaneous fat. Small peripherally enhancing fluid collections along the plantar and lateral aspects of the 5th metatarsal base suspicious for abscesses. 2. Interval amputation through the mid 5th metatarsal with nonspecific low-level marrow edema and  enhancement. Given the proximity to the adjacent soft tissue inflammatory changes, osteomyelitis cannot be excluded. 3. The additional bones appear unremarkable. MRI 09/29/2019 right foot IMPRESSION: 1. Soft tissue ulceration lateral to the 5th MTP joint. There is low-level T2 hyperintensity within the 4th and 5th metatarsal heads and adjacent proximal phalanges without abnormal T1 signal or cortical destruction. These findings are nonspecific and could be seen with early marrow edema, hyperemia or early osteomyelitis. No evidence of septic joint. 2. Mild tenosynovitis and synovial enhancement associated with the extensor digitorum tendons at the level of the midfoot. 3. Diffuse low-level muscular T2 hyperintensity and enhancement, most consistent with diabetic myopathy. LEFT FOOT BONE Methicillin resistant staphylococcus aureus Staphylococcus lugdunensis MIC MIC CIPROFLOXACIN >=8 RESISTANT Resistant <=0.5 SENSI... Sensitive CLINDAMYCIN <=0.25 SENS... Sensitive >=8 RESISTANT Resistant ERYTHROMYCIN >=8 RESISTANT Resistant >=8 RESISTANT Resistant GENTAMICIN <=0.5 SENSI... Sensitive <=0.5 SENSI... Sensitive Inducible Clindamycin NEGATIVE Sensitive NEGATIVE Sensitive OXACILLIN >=4 RESISTANT Resistant 2 SENSITIVE Sensitive RIFAMPIN <=0.5 SENSI... Sensitive <=0.5 SENSI... Sensitive TETRACYCLINE <=1 SENSITIVE Sensitive <=1 SENSITIVE Sensitive TRIMETH/SULFA <=10  SENSIT Sensitive <=10 SENSIT Sensitive ... Marland Kitchen.. VANCOMYCIN 1 SENSITIVE Sensitive <=0.5 SENSI... Sensitive Right foot bone . Component 3 wk ago Specimen Description BONE Special Requests RIGHT 4 METATARSAL SAMPLE B Gram Stain NO WBC SEEN NO ORGANISMS SEEN Culture RARE METHICILLIN RESISTANT STAPHYLOCOCCUS AUREUS NO ANAEROBES ISOLATED Performed at [facility], [place] [address] [place], [place] [address] Report Status 10/08/2019 FINAL Organism ID, Bacteria METHICILLIN RESISTANT STAPHYLOCOCCUS AUREUS [place], [name]  ([id number]) [id number] Page 4 of 21 Resulting Agency [facility] Susceptibility Methicillin resistant staphylococcus aureus MIC CIPROFLOXACIN >=8 RESISTANT Resistant CLINDAMYCIN <=0.25 SENS... Sensitive ERYTHROMYCIN >=8 RESISTANT Resistant GENTAMICIN <=0.5 SENSI... Sensitive Inducible Clindamycin NEGATIVE Sensitive OXACILLIN >=4 RESISTANT Resistant RIFAMPIN <=0.5 SENSI... Sensitive TETRACYCLINE <=1 SENSITIVE Sensitive TRIMETH/SULFA <=10 SENSIT Sensitive ... VANCOMYCIN 1 SENSITIVE Sensitive This is a patient we had in clinic earlier this year with a wound over his left fifth metatarsal head. 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 Doran Durand on 05/16/2019. He seems to have developed problems with wounds on his bilateral feet in June 2021 although he may have had problems earlier than this. He was in an urgent care with a right foot ulcer on 09/26/2019 and given a course of doxycycline. This was apparently after having trouble getting into see orthopedics. He was seen by podiatry on 09/28/2019 noted to have bilateral lower extremity ulcers including the left lateral fifth metatarsal base and the right subfifth met head. It was noted that had purulent drainage at that time. He required hospitalization from 6/20 through 7/2. This was because of worsening right foot wounds. He underwent bilateral operative incision and drainage and bone biopsies bilaterally. Culture results are listed above. He has been referred back to clinic by Dr. Jacqualyn Posey of podiatry. He is also followed by Dr. Megan Salon who saw him yesterday. He was discharged from hospital on Zyvox Flagyl and Levaquin and yesterday changed  to doxycycline Flagyl and Levaquin. His inflammatory markers on 6/26 showed a sedimentation rate of 129 and a C-reactive protein of 5. This is improved to 14 and 1.3 respectively. This would indicate improvement. ABIs in our clinic today were 1.23 on the right and 1.20 on the left 11/01/2019 on evaluation today patient appears to be doing fairly well in regard to the wounds on his feet at this point. Fortunately there is no signs of active infection at this time. No fevers, chills, nausea, vomiting, or diarrhea. He currently is seeing infectious disease and still under their care at this point. Subsequently he also has both wounds which she has not been using collagen on as he did not receive that in his packaging he did not call us and let us know that. Apparently that just was missed on the order. Nonetheless we will get that straightened out today. 8/9-Patient returns for bilateral foot wounds, using Prisma with hydrogel moistened dressings, and the wounds appear stable. Patient using surgical shoes, avoiding much pressure or weightbearing as much as possible 8/16; patient has bilateral foot wounds. 1 on the right lateral foot proximally the other is on the left mid lateral foot. Both required debridement of callus and thick skin around the wounds. We have been using silver collagen 8/27; patient has bilateral lateral foot wounds. The area on the left substantially  surrounded by callus and dry skin. This was removed from the wound edge. The underlying wound is small. The area on the right measured somewhat smaller today. 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 Unfortunately I did not update his antibiotics during today's visit. 9/10 I reviewed Dr. Hale Bogus last notes he felt he had completed antibiotics his inflammatory markers were reasonably well controlled. He has a small wound on the lateral left foot and a tiny area on  the right which is just above closed. He is using Hydrofera Blue with border foam he has bilateral surgical shoes 9/24; 2 week f/u. doing well. right foot is closed. left foot still undermined. 10/14; right foot remains closed at the fifth met head. The area over the base of the left fifth metatarsal has a small open area but considerable undermining towards the plantar foot. Thick callus skin around this suggests an adequate pressure relief. We have talked about this. He says he is going to go back into his cam boot. I suggested a total contact cast he did not seem enamored with this suggestion 10/26; left foot base of the fifth metatarsal. Same condition as last time. He has skin over the area with an open wound however the skin is not adherent. He went to see Dr. Earleen Newport who did an x-ray and culture of his foot I have not reviewed the x-ray but the patient was not told anything. He is on doxycycline 11/11; since the patient was last here he was in the emergency room on 10/30 he was concerned about swelling in the left foot. They did not do any cultures or x-rays. They changed his antibiotics to cephalexin. Previous culture showed group B strep. The cephalexin is appropriate as doxycycline has less than predictable coverage. Arrives in clinic today with swelling over this area under the wound. He also has a new wound on the right fifth metatarsal head 11/18; the patient has a difficult wound on the lateral aspect of the left fifth metatarsal head. The wound was almost ballotable last week I opened it slightly expecting to see purulence however there was just bleeding. I cultured this this was negative. X-ray unchanged. We are trying to get an MRI but I am not sure were going to be able to get this through his insurance. He also has an area on the right lateral fifth metatarsal head this looks healthier 12/3; the patient finally got our MRI. Surprisingly this did not show osteomyelitis. I did show the  soft tissue ulceration at the lateral plantar aspect of the fifth metatarsal base with a tiny residual 6 mm abscess overlying the superficial fascia I have tried to culture this area I have not been able to get this to grow anything. Nevertheless the protruding tissue looks aggravated. I suspect we should try to treat the underlying "abscess with broad-spectrum antibiotics. I am going to start him on Levaquin and Flagyl. He has much less edema in his legs and I am going to continue to wrap his legs and see him weekly 12/10. I started Levaquin and Flagyl on him last week. He just picked up the Flagyl apparently there was some delay. The worry is the wound on the left fifth metatarsal base which is substantial and worsening. His foot looks like he inverts at the ankle making this a weightbearing surface. Certainly no improvement in fact I think the measurements of this are somewhat worse. We have been using  12/17; he apparently just got the Levaquin yesterday this is 2 weeks after the fact. He has completed the Flagyl. The area over the left fifth metatarsal base still has protruding granulation tissue although it does not look quite as bad as it did some weeks ago. He has severe bilateral lymphedema although we have not been treating him for wounds on his legs this is definitely going to require compression. There was so much edema in the left I did not wish to put him in a total contact cast today. I am going to increase his compression from 3-4 layer. The area on the right lateral fifth met head actually look quite good and superficial. 12/23; patient arrived with callus on the right fifth met head and the substantial hyper granulated callused wound on the base of his fifth metatarsal. He says he is completing his Levaquin in 2 days but I do not think that adds up with what I gave him but I will have to double check this. We are using Hydrofera Blue on both areas. My plan is to put the left leg in a  cast the week after New Year's 04/06/2020; patient's wounds about the same. Right lateral fifth metatarsal head and left lateral foot over the base of the fifth metatarsal. There is undermining on the left lateral foot which I removed before application of total contact cast continuing with Hydrofera Blue new. Patient tells me he was seen by endocrinology today lab work was done [Dr. Kerr]. Also wondering whether he was referred to cardiology. I went over some lab work from previously does not have chronic renal failure certainly not nephrotic range proteinuria he does have very poorly controlled diabetes but this is not his most updated lab work. Hemoglobin A1c has been over 11 1/10; the patient had a considerable amount of leakage towards mid part of his left foot with macerated skin however the wound surface looks better the area on the right lateral fifth met head is better as well. I am going to change the dressing on the left foot under the total contact cast to silver alginate, continue with Hydrofera Blue on the right. 1/20; patient was in the total contact cast for 10 days. Considerable amount of drainage although the skin around the wound does not look too bad on the left foot. The area on the right fifth metatarsal head is closed. Our nursing staff reports large amount of drainage out of the left lateral foot wound 1/25; continues with copious amounts of drainage described by our intake staff. PCR culture I did last week showed E. coli and Enterococcus faecalis and low quantities. Multiple resistance genes documented including extended spectrum beta lactamase, MRSA, MRSE, quinolone, tetracycline. The wound is not quite as good this week as it was 5 days ago but about the same size 2/3; continues with copious amounts of malodorous drainage per our intake nurse. The PCR culture I did 2 weeks ago showed E. coli and low quantities of Enterococcus. There were multiple resistance genes detected. I  put Neosporin on him last week although this does not seem to have helped. The wound is Grenelefe, [name] ([id number]) [id number] Page 5 of 21 slightly deeper today. Offloading continues to be an issue here although with the amount of drainage she has a total contact cast is just not going to work 2/10; moderate amount of drainage. Patient reports he cannot get his stocking on over the dressing. I told him we have to do that the nurse  gave him suggestions on how to make this work. The wound is on the bottom and lateral part of his left foot. Is cultured predominantly grew low amounts of Enterococcus, E. coli and anaerobes. There were multiple resistance genes detected including extended spectrum beta lactamase, quinolone, tetracycline. I could not think of an easy oral combination to address this so for now I am going to do topical antibiotics provided by [facility] I think the main agents here are vancomycin and an aminoglycoside. We have to be able to give him access to the wounds to get the topical antibiotic on 2/17; moderate amount of drainage this is unchanged. He has his Keystone topical antibiotic against the deep tissue culture organisms. He has been using this and changing the dressing daily. Silver alginate on the wound surface. 2/24; using Keystone antibiotic with silver alginate on the top. He had too much drainage for a total contact cast at one point although I think that is improving and I think in the next week or 2 it might be possible to replace a total contact cast I did not do this today. In general the wound surface looks healthy however he continues to have thick rims of skin and subcutaneous tissue around the wide area of the circumference which I debrided 06/04/2020 upon evaluation today patient appears to be doing well in regard to his wound. I do feel like he is showing signs of improvement. There is little bit of callus and dead tissue around the edges  of the wound as well as what appears to be a little bit of a sinus tract that is off to the side laterally I would perform debridement to clear that away today. 3/17; left lateral foot. The wound looks about the same as I remember. Not much depth surface looks healthy. No evidence of infection 3/25; left lateral foot. Wound surface looks about the same. Separating epithelium from the circumference. There really is no evidence of infection here however not making progress by my view 3/29; left lateral foot. Surface of the wound again looks reasonably healthy still thick skin and subcutaneous tissue around the wound margins. There is no evidence of infection. One of the concerns being brought up by the nurses has again the amount of drainage vis--vis continued use of a total contact cast 4/5; left lateral foot at roughly the base of the fifth metatarsal. Nice healthy looking granulated tissue with rims of epithelialization. The overall wound measurements are not any better but the tissue looks healthy. The only concern is the amount of drainage although he has no surrounding maceration with what we have been doing recently to absorb fluid and protect his skin. He also has lymphedema. 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 He states that the kerlix/coban under the TCC rolled down and could not get it back up. He has been using an offloading scooter and has somehow hurt his right foot using this device. This happened last week. He states that the side of his right foot developed a blister and opened. The top of his foot also has a few small open wounds he thinks is due to his socks rubbing in his shoes. He has not been using any dressings to the wound.  He  denies purulent drainage, fever/chills or erythema to the wounds. 4/22; patient presents for 1 week follow-up. He developed new wounds to the right foot that were evaluated at last clinic visit. He continues to have a total contact cast to the left leg and he reports no issues. He has been using silver collagen to the right foot wounds with no issues. He denies purulent drainage, fever/chills or erythema to the right foot wounds. He has no complaints today 4/25; patient presents for 1 week follow-up. He has a total contact cast of the left leg and reports no issues. He has been using silver alginate to the right foot wound. He denies purulent drainage, fever/chills or erythema to the right foot wounds. 5/2 patient presents for 1 week follow-up. T contact cast on the left. The wound which is on the base of the plantar foot at the base of the fifth metatarsal otal actually looks quite good and dimensions continue to gradually contract. HOWEVER the area on the right lateral fifth metatarsal head is much larger than what I remember from 2 weeks ago. Once more is he has significant levels of hypergranulation. Noteworthy that he had this same hyper granulated response on his wound on the left foot at one point in time. So much so that he I thought there was an underlying fluid collection. Based on this I think this just needs debridement. 5/9; the wound on the left actually continues to be gradually smaller with a healthy surface. Slight amount of drainage and maceration of the skin around but not too bad. However he has a large wound over the right fifth metatarsal head very much in the same configuration as his left foot wound was initially. I used silver nitrate to address the hyper granulated tissue no mechanical debridement 5/16; area on the left foot did not look as healthy this week deeper thick surrounding macerated skin and subcutaneous tissue. The area on the right foot fifth met head was about  the same The area on the right ankle that we identified last week is completely broken down into an open wound presumably a stocking rubbing issue 5/23; patient has been using a total contact cast to the left side. He has been using silver alginate underneath. He has also been using silver alginate to the right foot wounds. He has no complaints today. He denies any signs of infection. 5/31; the left-sided wound looks some better measure smaller surface granulation looks better. We have been using silver alginate under the total contact cast The large area on his right fifth met head and right dorsal foot look about the same still using silver alginate 6/6; neither side is good as I was hoping although the surface area dimensions are better. A lot of maceration on his left and right foot around the wound edge. Area on the dorsal right foot looks better. He says he was traveling. I am not sure what does the amount of maceration around the plantar wounds may be drainage issues 6/13; in general the wound surfaces look quite good on both sides. Macerated skin and raised edges around the wound required debridement although in general especially on the left the surface area seems improved. The area on the right dorsal ankle is about the same I thought this would not be such a problem to close 6/20; not much change in either wound although the one on the right looks a little better. Both wounds have thick macerated edges to the skin requiring debridements.  We have been using silver alginate. The area on the dorsal right ankle is still open I thought this would be closed. 6/28; patient comes in today with a marked deterioration in the right foot wound fifth met head. Wide area of exposed bone this is a drastic change from last time. The area on the left there we have been casting is stagnant. We have been using silver alginate in both wound areas. 7/5; bone culture I did for PCR last time was positive for  Pseudomonas, group B strep, Enterococcus and Staph aureus. There was no suggestion of methicillin resistance or ampicillin resistant genes. This was resistant to tetracycline however He comes into the clinic today with the area over his right plantar fifth metatarsal head which had been doing so well 2 weeks ago completely necrotic feeling bone. I do not know that this is going to be salvageable. The left foot wound is certainly no smaller but it has a better surface and is superficial. 7/8; patient called in this morning to say that his total contact cast was rubbing against his foot. He states he is doing fine overall. He denies signs of infection. 7/12; continued deterioration in the wound over the right fifth metatarsal head crumbling bone. This is not going to be salvageable. The patient agrees and wants to be referred to Dr. Doran Durand which we will attempt to arrange as soon as possible. I am going to continue him on antibiotics as long as that takes so I will renew those today. The area on the left foot which is the base of the fifth metatarsal continues to look somewhat better. Healthy looking tissue no depth no debridement is necessary here. 7/20; the patient was kindly seen by Dr. Doran Durand of orthopedics on 10/19/2020. 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 Towards this end we have taken him out of the total contact cast on the left we will put him in a wrap with Hydrofera Blue. As I understand things surgery is planned for 7/21 7/27; patient had his surgery last Thursday. 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 Apparently everything went well we did not still disturb that today The area on the left foot actually looks quite good. He has been much less mobile which probably explains this he did not seem to do well in the total contact cast secondary to drainage and maceration I think. We have been  using [facility], [name] ([id number]) [id number] Page 6 of 21 11/09/2020 upon evaluation today patient appears to be doing well with regard to his plantar foot ulcer on the left foot. Fortunately there is no evidence of active infection at this time. No fevers, chills, nausea, vomiting, or diarrhea. Overall I think that he is actually doing extremely well. Nonetheless I do believe that he is staying off of this more following the surgery in his right foot that is the reason the left is doing so great. 8/16; left plantar foot wound. This looks smaller than the last time I saw this he is using Hydrofera Blue. The surgical wound on the right foot is being followed by Dr. Doran Durand we did not look at this today. He has surgical shoes on both feet 8/23; left plantar foot wound not as good this week. Surrounding macerated skin and subcutaneous tissue everything looks moist and wet. I do not think he is offloading this adequately. He  is using a surgical shoe Apparently the right foot surgical wound is not open although I did not check his foot 8/31; left plantar foot lateral aspect. Much improved this week. He has no maceration. Some improvement in the surface area of the wound but most impressively the depth is come in we are using silver alginate. The patient is a Product/process development scientist. He is asked that we write him a letter so he can go back to work. I have also tried to see if we can write something that will allow him to limit the amount of time that he is on his foot at work. 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 Hopefully the latter can be avoided 9/6; I believe the patient missed an appointment last week. He arrives in today with a wound looking roughly the same certainly no better. Undermining laterally and also inferiorly. We used molecuLight today in training with the patient's  permission.. We are using silver alginate 9/21 wound is measuring bigger this week although this may have to do with the aggressive circumferential debridement last week in response to the blush fluorescence on the MolecuLight. Culture I did last week showed significant MSSA and E. coli. I put him on Augmentin but he has not started it yet. We are also going to send this for compounded antibiotics at [facility]. There is no evidence of systemic infection 9/29; silver alginate. His Keystone arrived. He is completing Augmentin in 2 days. Offloading in a cam boot. Moderate drainage per our intake staff 10/5; using silver alginate. He has been using his [place]. He has completed his Augmentin. Per our intake nurse still a lot of drainage, far too much to consider a total contact cast. Wound measures about the same. He had the same undermining area that I defined last week from a roughly 11-3. I remove this today 10/12; using silver alginate he is using the [place]. He comes in for a nurse visit hence we are applying [employer] twice a week. Measuring slightly better today and less notable drainage. Extensive debridement of the wound edge last time 10/18; using topical Keystone and silver alginate and a soft cast. Wound measurements about the same. Drainage was through his soft cast. We are changing this twice a week Tuesdays and Friday 10/25; comes in with moderate drainage. Still using Keystone silver alginate and a soft cast. Wound dimensions completely the same.He has a lot of edema in the left leg he has lymphedema. Asking for [name] to consider wrapping him as he cannot get his stocking on over the soft cast 11/2; comes in with moderate to large drainage slightly smaller in terms of width we have been using [place]. His wound looks satisfactory but not much improvement 11/4; patient presents today for obligatory cast change. Has no issues or complaints today. He denies signs of infection. 11/9; patient  traveled this weekend to [place], was on the cast quite a bit. Staining of the cast with black material from his walking boot. Drainage was not quite as bad as we feared. Using silver alginate and Keystone 11/16; we do not have size for cast therefore we have been putting a soft cast on him since the change on Friday. Still a significant amount of drainage necessitating changing twice a week. We have been using the Keystone at cast changes either hard or soft as well as silver alginate Comes in the clinic with  things actually looking fairly good improvement in width. He says his offloading is about the same 02/24/2021 upon evaluation today patient actually comes back in and is doing excellent in regard to his foot ulcer this is significantly smaller even compared to the last visit. The soft cast seems to have done extremely well for him which is great news. I do not see any signs of infection minimal debridement will be needed today. 11/30; left lateral foot much improved half a centimeter improvement in surface area. No evidence of infection. He seems to be doing better with the soft cast in the TCC therefore we will continue with this. He comes back in later in the week for a change with the nurses. This is due to drainage 12/6; no improvement in dimensions. Under illumination some debris on the surface we have been using silver alginate, soft cast. If there is anything optimistic here he seems to have have less drainage 12/13. Dimensions are improved both length and width and slightly in depth. Appears to be quite healthy today. Raised edges of this thick skin and callus around the edges however. He is in a soft cast were bringing him back once for a change on Friday. Drainage is better 12/20. Dimensions are improved. He still has raised edges of thick skin and callus around the edges. We are using a soft cast 12/28; comes in today with thick callus around the wound. Using silver under alginate under a  soft cast. I do not think there is much improvement in any measurement 2023 04/06/2021; patient was put in a total contact cast. Unfortunately not much change in surface area 1/10; not much different still thick callus and skin around the edge in spite of the total contact cast. This was just debrided last week we have been using the [facility] compounded antibiotic and silver alginate under a total contact cast 1/18 the patient's wound on the left side is doing nicely. smaller HOWEVER he comes in today with a wound on the right foot laterally. blister most likely serosangquenous drainage 1/24; the patient continues to do well in terms of the plantar left foot which is continued to contract using silver alginate under the total contact cast HOWEVER the right lateral foot is bigger with denuded skin around the edges. I used pickups and a #15 scalpel to remove this this looks like the remanence of a large blister. Cannot rule out infection. Culture in this area I did last week showed Staphylococcus lugdunensis few colonies. I am going to try to address this with his [employer] antibiotic that is done so well on the left having linezolid and this should cover the staph 2/1; the patient's wound on his left foot which was the original plantar foot wound thick skin and eschar around the edges even in the total contact cast but the wound surface does not look too bad The real problem is on how his right lateral foot at roughly the base of the fifth metatarsal. The wound is completely necrotic more worrisome than that there is swelling around the edges of this. We have been using silver alginate on both wounds and Keystone on the right foot. Unfortunately I think he is going to require systemic antibiotics while we await cultures. He did not get the x-ray done that we ordered last week [lost the prescription 2/7; disappointingly in the area on the left foot which we are treating with a total contact cast is still  not closed although it is much smaller.  He continues to have a lot of callus around the wound edge. -Right lateral foot culture I did last week was negative x-ray also negative for osteomyelitis. 2/15: TCC silver alginate on the left and silver alginate on the right lateral. No real improvement in either area 05/26/2021: T oday, the wounds are roughly the same size as at his previous visit, post-debridement. He continues to endorse fairly substantial drainage, particularly on the right. He has been in a total contact cast on the left. There is still some callus surrounding this lesion. On the right, the periwound skin is quite macerated, along with surrounding callus. The center of the right-sided wound also has some dark, densely adherent material, which is very difficult to remove. 06/02/2021: Today, both wounds are slightly smaller. He has been using zinc oxide ointment around the right ulcer and the degree of maceration has improved markedly. There continues to be an area of nonviable tissue in the center of the right sided ulcer. The left-sided wound, which has been in the total contact [place], [name] ([id number]) [id number] Page 7 of 21 cast. Appears clean and the degree of callus around it is less than previously. 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 He had to take the stairs and both wounds have increased in size. The left foot, which has been in a total contact cast, has developed a tunnel tracking to the lateral aspect of his foot. The nonviable tissue in the center of the right-sided ulcer remains recalcitrant to debridement. There is significant undermining surrounding the entirety of the left sided wound. 06/16/2021: The elevator at school has been fixed and the patient has been able to avoid putting as much weight on his wounds over the past week. We converted the left foot wound into a  single lesion today, but despite this, the wound is actually smaller. The base is healthy with limited periwound callus. On the right, the central necrotic area is still present. He continues to be quite macerated around the right sided wound, despite applying barrier cream. This does, however, have the benefit of softening the callus to make it more easily removable. 06/23/2021: Today, the left wound is smaller. The lateral aspect that had opened up previously is now closed. The wound base has a healthy bed of granulation tissue and minimal slough. Unfortunately, on the right, the wound is larger and continues to be fairly macerated. He has also reopened the wound at his right ankle. He thinks this is due to the gait he has adopted secondary to his total contact cast and boot. 06/30/2021: T oday, both wounds are a little bit larger. The lateral aspect on the left has remained closed. He continues to have significant periwound maceration. The culture that I took from the right sided wound grew a population of bacteria that is not covered by his current [facility] antibiotic. The center of the right- sided wound continues to appear necrotic with nonviable fat. It probes deeper today, but does not reach bone. 07/07/2021: The periwound maceration is a little bit less today. The right lateral foot wound has some areas that appear more viable and the necrotic center also looks a little bit better. The wound on the dorsal surface of his right foot near the ankle is contracting and the surface appears healthy. The left plantar wound surface looks healthy, but there is some new undermining on the medial portion. He did get his new Keystone antibiotic and began  applying that to the right foot wound on Saturday. 07/14/2021: The intake nurse reported substantial drainage from his wounds, but the periwound skin actually looks better than is typical for him. The wound on the dorsal surface of his right foot near the ankle  is smaller and just has a small open area underneath some dried eschar. The left plantar wound surface looks healthy and there has been no significant accumulation of callus. The right lateral foot wound looks quite a bit better, with the central portion, which typically appears necrotic, looking more viable albeit pale. 07/22/2021: His left foot is extremely macerated today. The wound is about the same size. The wound on the dorsal surface of his right foot near the ankle had closed, but he traumatized it removing the dressing and there is a tiny skin tear in that location. The right lateral foot wound is bigger, but the surface appears healthy. 07/30/2021: The wound on the dorsal surface of his right foot near the ankle is closed. The right lateral foot wound again is a little bit bigger due to some undermining. The periwound skin is in better condition, however. He has been applying zinc oxide. The wound surface is a little bit dry today. On the left, he does not have the substantial maceration that we frequently see. The wound itself is smaller and has a clean surface. 08/06/2021: Both wounds seem to have deteriorated over the past week. The right lateral foot wound has a dry surface but the periwound is boggy.. Overall wound dimensions are about the same. On the left, the wound is about the same size, but there is more undermining present underneath periwound callus. 08/13/2021: The right sided wound looks about the same, but on the left there has been substantial deterioration. The undermining continues to extend under periwound callus. Once this was removed, substantial extension of the wound was present. There is no odor or purulent drainage but clearly the wounds have broken down. 08/20/2021: The wounds look about the same today. He has been out of his total contact cast and has just been changing the dressings using topical Keystone with PolyMem Ag, Kerlix and Ace bandages. The wound on the top of  his right ankle has reopened but this is quite small. There was a little bit of purulent material that I expressed when examining this wound. 08/24/2021: After the aggressive debridement I performed at his last visit, the wounds actually look a little bit better today. They are smaller with the exception of the wound on the top of his right ankle which is a little bit bigger as some more skin pulled off when he was changing his dressing. We are using topical Keystone with PolyMem Ag Kerlix and Ace bandages. 09/02/2021: There has been really no change to any of his wounds. 09/16/2021: The patient was hospitalized last week with nausea, vomiting, and dehydration. He says that while he was in the hospital, his wounds were not really addressed properly. T oday, both plantar foot wounds are larger and the periwound skin is macerated. The wound on the dorsum of his right foot has a scab on the top. The right foot now has a crater where previously he had had nonviable fat. It looks as though this simply died and fell out. The periwound callus is wet. 09/24/2021: His wounds have deteriorated somewhat since his last visit. The wound on the dorsum of his right foot near his ankle is larger and has more nonviable tissue present. The crater in his  right foot is even deeper; I cannot quite palpate or probe to bone but I am sure it is close. The wound on his left plantar foot has an odd boggy area in the center that almost feels as though it has fluid within it. He has run out of his topical Keystone antibiotic. We are using silver alginate on his wounds. 09/29/2021: He has developed a new wound on the dorsum of his left foot near his ankle. He says he thinks his wrapping is rubbing in that site. I would concur with this as the wound on his right ankle is larger. The left foot looks about the same. The right foot has the crater that was present last week. No significant slough accumulation, but his foot remains quite  swollen and warm despite oral antibiotic therapy. 10/08/2021: All of his wounds look about the same as last week. He did not start his oral antibiotics that are prescribed until just a couple of days ago; his [employer] compounded antibiotics formula has been changed and he is awaiting delivery of the new recipe. His MRI that was scheduled for earlier this week was canceled as no prior authorization had been obtained; unfortunately the tech responsible sent an email to my old [place] email, which I no longer use nor have access to. 10/18/2021: The wounds on his bilateral dorsal feet near the ankles are both improved. They are smaller and have just some eschar and slough buildup. The left plantar wound has a fair amount of undermining, but the surface is clean. There is some periwound callus accumulation. On the right plantar foot, there is nonviable fat leading to a deep tunnel that tracks towards his dorsal medial foot. There is periwound callus and slough accumulation, as well. His right foot and leg remain swollen as compared to the left. 10/25/2021: The wounds on his bilateral dorsal feet and at the ankles have broken down somewhat. They are little bit larger than last week. The left plantar wound continues to undermine laterally but the surface is clean. The right plantar foot wound shows some decreased depth in the tunnel tracking towards his dorsal medial foot. He has not yet had the Doppler study that I ordered; it sounds like there is some confusion about the scheduling of the procedure. In addition, the MRI was denied and I have taken steps to appeal the denial. 11/24/2021: Since our last visit, Mr. Maltos was admitted to the hospital where an MRI suggested osteomyelitis. He was taken the operating room by podiatry. Bone biopsies were negative for osteomyelitis. They debrided his wounds and applied myriad matrix. He saw them last week and they removed his staples. He is here today to  continue his wound healing process. T oday, both of the dorsal foot/ankle wounds are substantially smaller. There is just a little eschar overlying the left sided wound and some eschar and slough on the right. The right plantar foot ulcer has the healthiest surface of granulation tissue that I have seen to date. A portion of the myriad matrix failed to take and was hanging loose. It appears that myriad morcells were placed into the tunnel closest to the dorsal portion of his foot. These have sloughed off. The left plantar foot ulcer is about the same size, but has a much healthier surface than in the past. Both plantar ulcers have callus and slough accumulation. 12/07/2021: Left dorsal foot/ankle wound is closed. The right dorsal foot/ankle wound is nearly closed and just has a small open area  with some eschar and slough. The right plantar foot wound has contracted quite a bit since our last visit. It has a healthy surface with just a little bit of slough accumulation and [place], [name] ([id number]) [id number] Page 8 of 21 periwound callus buildup. The left plantar foot wound is about the same size but the surface appears healthy. There is a little slough and periwound callus on this side, as well. 12/15/2021: Both dorsal foot/ankle wounds are closed. The right plantar foot wound is substantially smaller than at our last visit. The tunneling that was present has nearly closed. There is just a little bit of slough buildup. The left plantar foot wound is also a little bit smaller today. The surface is the healthiest that I have ever seen it. Light slough and periwound callus accumulation on this side. 12/22/2021: The dorsal ankle/foot wounds remain closed. The right plantar foot wound continues to contract. There is still a bit of depth at the lateral portion of the wound but the surface has a good granulated appearance. The wound on the left is about the same size, the central  indented portion is still adherent. It also is clean with good granulation tissue present. The periwound skin and callus are a bit macerated, however. 12/29/2021: Both ankle wounds remain closed. The right plantar foot wound is less than half the size that it was last week. There is no depth any longer and the surface has just a little bit of slough and periwound callus. On the left, the wound is not much smaller, but the surface is healthier with good granulation tissue. There is also a little slough and periwound callus buildup. 01/05/2022: The right plantar foot wound continues to contract and is once again about half the size as it was last week. There is just a little bit of surface slough and periwound callus. On the left, the depth of the wound has decreased and the diameter has started to contract. It is also very clean with just a little slough and biofilm present. 01/12/2022: The right plantar foot wound is smaller again today. There is just some periwound callus and slough accumulation. On the left, there is a fair amount of undermining and the surface is a little boggy, but no other significant change. 01/19/2022: The right plantar wound continues to contract. There is minimal periwound callus accumulation and just a light layer of slough on the surface. On the left, the surface looks better, but there is fairly significant undermining near the 11:00 portion of the wound, aiming towards his great toe. The skin overlying this area is very healthy and has a good fat layer present. No malodor or purulent drainage. 01/26/2022: The right sided wound continues to contract and has just a light layer of slough on the surface. Minimal periwound callus. On the left, he still has substantial undermining and the tissue surface is not as robust as I would like to see. No malodor or purulent drainage, but he did say he had a lot of serous drainage over the weekend. 02/02/2022: The right foot wound is  about a quarter of the size that it was at his last visit. There is just a little slough on the surface and some periwound callus. On the left, the undermining persists and the tissue is still more pale, but he does not seem to have had as much drainage over the last weekend. We are waiting on a wound VAC. 02/09/2022: His right foot has healed. He received the wound VAC,  but did not bring it to clinic with him today. The lateral aspect of the left foot wound has come in a little, but he still has substantial undermining on the medial and distal portion of the wound. Still with macerated periwound callus. 02/16/2022: The wound VAC was applied last Friday. T oday, there has been tremendous improvement in the surface of the wound bed. The undermined portion of the wound has come in a bit. There is still some overhanging dead skin. Overall the wound looks much better. Electronic Signature(s) Signed: 02/16/2022 9:09:06 AM By: [name] MD FACS Entered By: [name] on 02/16/2022 09:09:06 -------------------------------------------------------------------------------- Physical Exam Details Patient Name: Date of Service: [name], [name] 02/16/2022 8:15 A M Medical Record Number: [id number] Patient Account Number: [id number] Date of Birth/Sex: Treating RN: 1986-01-25 (36 y.o. M) Primary Care Provider: [name] Other Clinician: Referring Provider: Treating Provider/Extender: [name] in Treatment: 120 Constitutional Slightly hypertensive. . . . No acute distress.Marland Kitchen Respiratory Normal work of breathing on room air.. Notes 02/16/2022: There has been tremendous improvement in the surface of the wound bed. The undermined portion of the wound has come in a bit. There is still some overhanging dead skin. Overall the wound looks much better. Electronic Signature(s) Signed: 02/16/2022 9:09:47 AM By: [name] MD FACS Entered By: [name] on  02/16/2022 09:09:47 [place], [name] ([id number]) [id number] Page 9 of 21 -------------------------------------------------------------------------------- Physician Orders Details Patient Name: Date of Service: [name] 02/16/2022 8:15 A M Medical Record Number: [id number] Patient Account Number: [id number] Date of Birth/Sex: Treating RN: 11/04/1985 (36 y.o. 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 - Dr. [name] - Room 1 with [name] Return A Wed 11/22 @ 08:15 am Bathing/ Shower/ Hygiene May shower and wash wound with soap and water. Negative Presssure Wound Therapy Wound #3 Left,Lateral,Plantar Foot Wound Vac to wound continuously at 173m/hg pressure Black Foam Edema Control - Lymphedema / SCD / Other Bilateral Lower Extremities Elevate legs to the level of the heart or above for 30 minutes daily and/or when sitting, a frequency of: - throughout the day Avoid standing for long periods of time. Exercise regularly Compression stocking or Garment 20-30 mm/Hg pressure to: - to both legs daily Off-Loading Open toe surgical shoe to: - Both feet Additional Orders / Instructions Follow Nutritious Diet Non Wound Condition pply the following to affected area as directed: - continue to apply foam donut or cushion to healed right plantar foot A Wound Treatment Wound #3 - Foot Wound Laterality: Plantar, Left, Lateral Cleanser: Soap and Water 3 x Per Week/30  Days Discharge Instructions: May shower and wash wound with dial antibacterial soap and water prior to dressing change. Prim Dressing: VAC ary 3 x Per Week/30 Days Compression Wrap: Kerlix Roll 4.5x3.1 (in/yd) (Generic) 3 x Per Week/30 Days Discharge Instructions: Apply Kerlix and Coban compression as directed. Electronic Signature(s) Signed: 02/16/2022 9:48:09 AM By: CFredirick MaudlinMD FACS Entered By: CFredirick Maudlinon 02/16/2022 0[place] CMali([id number] Page 10 of 21 -------------------------------------------------------------------------------- Problem List Details Patient Name: Date of Service: A RMSTRO  Lorane Gell [name] 02/16/2022 8:15 A M Medical Record Number: [id number] Patient Account Number: [id number] Date of Birth/Sex: Treating RN: 12-07-1985 (36 y.o. M) Primary Care Provider: [name] Other Clinician: Referring Provider: Treating Provider/Extender: [name] in Treatment: 120 Active Problems ICD-10 Encounter Code Description Active Date MDM Diagnosis E11.621 Type 2 diabetes mellitus with foot ulcer 10/24/2019 No Yes L97.528 Non-pressure chronic ulcer of other part of left foot with other specified 10/24/2019 No Yes severity L97.518 Non-pressure chronic ulcer of other part of right foot with other specified 10/24/2019 No Yes severity Inactive Problems ICD-10 Code Description Active Date Inactive Date L97.518 Non-pressure chronic ulcer of other part of right foot with other specified severity 07/14/2020 07/14/2020 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, right ankle and foot 10/24/2019 10/24/2019 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 08/10/2020 08/10/2020 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, left ankle and foot 10/24/2019 10/24/2019 L97.322 Non-pressure chronic ulcer of left ankle with fat layer exposed 09/29/2021 09/29/2021 B95.62 Methicillin  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 diseases 10/24/2019 10/24/2019 classified elsewhere Resolved Problems Electronic Signature(s) Signed: 02/16/2022 9:07:27 AM By: [name] MD FACS Entered By: [name] on 02/16/2022 09:07:26 [place], [name] ([id number]) [id number] Page 11 of 21 -------------------------------------------------------------------------------- Progress Note Details Patient Name: Date of Service: [name] 02/16/2022 8:15 A M Medical Record Number: [id number] Patient Account Number: [id number] Date of Birth/Sex: Treating RN: 04/25/1985 (36 y.o. 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 He comes in for review of a wound over the plantar fifth metatarsal head extending into the lateral part of the foot. He was followed for this previously by his podiatrist Dr. Cornelius Moras. As the patient tells his story he went to see podiatry first for a swelling he developed on the lateral part of his fifth metatarsal head in May. He states this was "open" by podiatry and the area closed. He was followed up in June and it was again opened callus removed and it closed promptly. There were plans being made for surgery on the fifth metatarsal head in June however his blood sugar was apparently too high for anesthesia. Apparently the area was debrided  and opened again in June and it is never closed since. Looking over the records from podiatry I am really not able to follow this. It was clear when he was first seen it was before 5/14 at that point he already had a wound. By 5/17 the ulcer was resolved. I do not see anything about a procedure. On 5/28 noted to have pre-ulcerative moderate keratosis. X-ray noted 1/5 contracted toe and tailor's bunion and metatarsal deformity. On a visit date on 09/28/2018 the dorsal part of the left foot it healed and resolved. There was concern about swelling in his lower extremity he was sent to the ER.. As far as I can tell he was seen in the ER on 7/12 with an ulcer on his left foot. A DVT rule out of the left leg was negative. I do  not think I have complete records from podiatry but I am not able to verify the procedures this patient states he had. He states after the last procedure the wound has never closed although I am not able to follow this in the records I have from podiatry. He has not had a recent x-ray The patient has been using Neosporin on the wound. He is wearing a Darco shoe. He is still very active up on his foot working and exercising. Past medical history; type 2 diabetes ketosis-prone, leg swelling with a negative DVT study in July. Non-smoker ABI in our clinic was 0.85 on the left 10/16; substantial wound on the plantar left fifth met head extending laterally almost to the dorsal fifth MTP. We have been using silver alginate we gave him a Darco forefoot off loader. An x-ray did not show evidence of osteomyelitis did note soft tissue emphysema which I think was due to gas tracking through an open wound. There is no doubt in my mind he requires an MRI 10/23; MRI not booked until 3 November at the earliest this is largely due to his glucose sensor in the right arm. We have been using silver alginate. There has been an improvement 10/29; I am still not exactly sure when his MRI is booked for. He  says it is the third but it is the 10th in epic. This definitely needs to be done. He is running a low-grade fever today but no other symptoms. No real improvement in the 1 02/26/2019 patient presents today for a follow-up visit here in our clinic he is last been seen in the clinic on October 29. Subsequently we were working on getting MRI to evaluate and see what exactly was going on and where we would need to go from the standpoint of whether or not he had osteomyelitis and again what treatments were going be required. Subsequently the patient ended up being admitted to the hospital on 02/07/2019 and was discharged on 02/14/2019. This is a somewhat interesting admission with a discharge diagnosis of pneumonia due to COVID-19 although he was positive for COVID-19 when tested at the urgent care but negative x2 when he was actually in the hospital. With that being said he did have acute respiratory failure with hypoxia and it was noted he also have a left foot ulceration with osteomyelitis. With that being said he did require oxygen for his pneumonia and I level 4 L. He was placed on antivirals and steroids for the COVID-19. He was also transferred to the [facility] at one point. Nonetheless he did subsequently discharged home and since being home has done much better in that regard. The CT angiogram did not show any pulmonary embolism. With regard to the osteomyelitis the patient was placed on vancomycin and Zosyn while in the hospital but has been changed to Augmentin at discharge. It was also recommended that he follow- up with wound care and podiatry. Podiatry however wanted him to see [name] according to the patient prior to them doing anything further. His hemoglobin A1c was 9.9 as noted in the hospital. Have an MRI of the left foot performed while in the hospital on 02/04/2019. 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 There is an overlying plantar open wound noted an abscess tracking back along the lateral aspect of the fifth metatarsal shaft. There is otherwise diffuse cellulitis and mild fasciitis without findings of polymyositis.  The patient did have recently pneumonia secondary to COVID-19 I looked in the chart through epic and it does appear that the patient may need to have an additional x-ray just to ensure everything is cleared and that he has no airspace disease prior to putting him into the chamber. 03/05/2019; patient was readmitted to the clinic last week. He was hospitalized twice for a viral upper respiratory tract infection from 11/1 through 11/4 and then 11/5 through 11/12 ultimately this turned out to be Covid pneumonitis. Although he was discharged on oxygen he is not using it. He says he feels fine. He has no exercise limitation no cough no sputum. His O2 sat in our clinic today was 100% on room air. 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 He received Vanco and Zosyn in the hospital and then was discharged on 2 weeks of Augmentin. I do not see any relevant cultures. He was supposed to follow-up with infectious disease but I do not see that he has an appointment. 12/8; patient saw Dr. Novella Olive of infectious disease last week. He felt that he had had adequate antibiotic therapy. He did not go to follow-up with Dr. Amalia Hailey of podiatry and I have again talked to him about the pros and cons of this. He does not want to consider a ray amputation of this time. He is aware of the risks of recurrence, migration etc. He started HBO today and tolerated this well. He can complete the Augmentin that I gave him last week. I have looked over the lab work that Dr. Chana Bode ordered his C-reactive protein was 3.3 and his sedimentation rate was 17. The C-reactive protein is never  really been measurably that high in this patient 12/15; not much change in the wound today however he has undermining along the lateral part of the foot again more extensively than last week. He has some rims of epithelialization. We have been using silver alginate. He is undergoing hyperbarics but did not dive today 12/18; in for his obligatory first total contact cast change. Unfortunately there was pus coming from the undermining area around his fifth metatarsal head. This was cultured but will preclude reapplication of a cast. He is seen in conjunction with HBO 12/24; patient had staph lugdunensis in the wound in the undermining area laterally last time. We put him on doxycycline which should have covered this. The wound looks better today. I am going to give him another week of doxycycline before reattempting the total contact cast 12/31; the patient is completing antibiotics. Hemorrhagic debris in the distal part of the wound with some undermining distally. He also had hyper granulation. Extensive debridement with a #5 curette. The infected area that was on the lateral part of the fifth met head is closed over. I do not think he needs any more antibiotics. Patient was seen prior to HBO. Preparations for a total contact cast were made in the cast will be placed post hyperbarics 04/11/19; once again the patient arrives today without complaint. He had been in a cast all week noted that he had heavy drainage this week. This resulted in large raised areas of macerated tissue around the wound 1/14; wound bed looks better slightly smaller. Hydrofera Blue has been changing himself. He had a heavy drainage last week which caused a lot of maceration around the wound so I took him out of a total contact cast  he says the drainage is actually better this week [place], [name] ([id number]) [id number] Page 12 of 21 He is seen today in conjunction with HBO 1/21; returns to clinic. He  was up in [place] for a day or 2 attending a funeral. He comes back in with the wound larger and with a large area of exposed bone. He had osteomyelitis and septic arthritis of the fifth left metatarsal head while he was in hospital. He received IV antibiotics in the hospital for a prolonged period of time then 3 weeks of Augmentin. Subsequently I gave him 2 weeks of doxycycline for more superficial wound infection. When I saw this last week the wound was smaller the surface of the wound looks satisfactory. 1/28; patient missed hyperbarics today. Bone biopsy I did last time showed Enterococcus faecalis and Staphylococcus lugdunensis . He has a wide area of exposed bone. We are going to use silver alginate as of today. I had another ethical discussion with the patient. 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 In this situation I think the likelihood of healing this is low. Therefore I have recommended a ray amputation and with the patient's agreement I have referred him to Dr. Doran Durand. The other issue is that his compliance with hyperbarics has been minimal because of his work schedule and given his underlying decision I am going to stop this today READMISSION 10/24/2019 MRI 09/29/2019 left foot IMPRESSION: 1. Apparent skin ulceration inferior and lateral to the 5th metatarsal base with underlying heterogeneous T2 signal and enhancement in the subcutaneous fat. Small peripherally enhancing fluid collections along the plantar and lateral aspects of the 5th metatarsal base suspicious for abscesses. 2. Interval amputation through the mid 5th metatarsal with nonspecific low-level marrow edema and enhancement. Given the proximity to the adjacent soft tissue inflammatory changes, osteomyelitis cannot be excluded. 3. The additional bones appear unremarkable. MRI 09/29/2019 right foot IMPRESSION: 1. Soft tissue ulceration lateral to the 5th MTP joint. There  is low-level T2 hyperintensity within the 4th and 5th metatarsal heads and adjacent proximal phalanges without abnormal T1 signal or cortical destruction. These findings are nonspecific and could be seen with early marrow edema, hyperemia or early osteomyelitis. No evidence of septic joint. 2. Mild tenosynovitis and synovial enhancement associated with the extensor digitorum tendons at the level of the midfoot. 3. Diffuse low-level muscular T2 hyperintensity and enhancement, most consistent with diabetic myopathy. LEFT FOOT BONE Methicillin resistant staphylococcus aureus Staphylococcus lugdunensis MIC MIC CIPROFLOXACIN >=8 RESISTANT Resistant <=0.5 SENSI... Sensitive CLINDAMYCIN <=0.25 SENS... Sensitive >=8 RESISTANT Resistant ERYTHROMYCIN >=8 RESISTANT Resistant >=8 RESISTANT Resistant GENTAMICIN <=0.5 SENSI... Sensitive <=0.5 SENSI... Sensitive Inducible Clindamycin NEGATIVE Sensitive NEGATIVE Sensitive OXACILLIN >=4 RESISTANT Resistant 2 SENSITIVE Sensitive RIFAMPIN <=0.5 SENSI... Sensitive <=0.5 SENSI... Sensitive TETRACYCLINE <=1 SENSITIVE Sensitive <=1 SENSITIVE Sensitive TRIMETH/SULFA <=10 SENSIT Sensitive <=10 SENSIT Sensitive ... Marland Kitchen.. VANCOMYCIN 1 SENSITIVE Sensitive <=0.5 SENSI... Sensitive Right foot bone . Component 3 wk ago Specimen Description BONE Special Requests RIGHT 4 METATARSAL SAMPLE B Gram Stain NO WBC SEEN NO ORGANISMS SEEN Culture RARE METHICILLIN RESISTANT STAPHYLOCOCCUS AUREUS NO ANAEROBES ISOLATED Performed at [facility], [place] [address], [place], [place] [address] Report Status 10/08/2019 FINAL Organism ID, Bacteria METHICILLIN RESISTANT STAPHYLOCOCCUS AUREUS Resulting Agency [facility] Susceptibility Methicillin resistant staphylococcus aureus MIC CIPROFLOXACIN >=8 RESISTANT Resistant CLINDAMYCIN <=0.25 SENS... Sensitive ERYTHROMYCIN >=8 RESISTANT Resistant GENTAMICIN <=0.5 SENSI... Sensitive Inducible Clindamycin NEGATIVE  Sensitive OXACILLIN >=4 RESISTANT Resistant RIFAMPIN <=0.5 SENSI... Sensitive TETRACYCLINE <=1 SENSITIVE Sensitive TRIMETH/SULFA <=  10 SENSIT Sensitive ... VANCOMYCIN 1 SENSITIVE Sensitive This is a patient we had in clinic earlier this year with a wound over his left fifth metatarsal head. 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 Doran Durand on 05/16/2019. He seems to have developed problems with wounds on his bilateral feet in June 2021 although he may have had problems earlier than this. He was in an urgent care with a right foot ulcer on 09/26/2019 and given a course of doxycycline. This was apparently after having trouble getting into see orthopedics. He was seen by podiatry on 09/28/2019 noted to have bilateral lower extremity ulcers including the left lateral fifth metatarsal base and the right subfifth met head. It was noted that had purulent drainage at that time. He required hospitalization from 6/20 through 7/2. This was because of worsening right foot wounds. He underwent bilateral operative incision and drainage and bone biopsies bilaterally. Culture results are listed above. He has been referred back to clinic by Dr. Jacqualyn Posey of podiatry. He is also followed by Dr. Megan Salon who saw him yesterday. He was discharged from hospital on Zyvox Flagyl and Levaquin and yesterday changed to doxycycline Flagyl and Levaquin. His inflammatory markers on 6/26 showed a sedimentation rate of 129 and a C-reactive protein of 5. This is improved to 14 and 1.3 respectively. This would indicate improvement. ABIs in our clinic today were 1.23 on the  right and 1.20 on the left 11/01/2019 on evaluation today patient appears to be doing fairly well in regard to the wounds on his feet at this point. Fortunately there is no signs of active infection at this time. No fevers, chills, nausea, vomiting, or diarrhea. He currently is seeing infectious disease and still under their care at this point. Subsequently he also has both wounds which she has not been using collagen on as he did not receive that in his packaging he did not call us and let us know that. Apparently that just was missed on the order. Nonetheless we will get that straightened out today. 8/9-Patient returns for bilateral foot wounds, using Prisma with hydrogel moistened dressings, and the wounds appear stable. Patient using surgical shoes, avoiding much pressure or weightbearing as much as possible 8/16; patient has bilateral foot wounds. 1 on the right lateral foot proximally the other is on the left mid lateral foot. Both required debridement of callus and thick skin around the wounds. We have been using silver collagen 8/27; patient has bilateral lateral foot wounds. The area on the left substantially surrounded by callus and dry skin. This was removed from the wound edge. The underlying wound is small. The area on the right measured somewhat smaller today. 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 Unfortunately I did not update his antibiotics during today's visit. 9/10 I reviewed Dr. Hale Bogus last notes he felt he had completed antibiotics his inflammatory markers were reasonably well controlled. He has a small wound on the lateral left foot and a tiny area on the right which is just above closed. He is using Hydrofera Blue with border foam he has bilateral surgical shoes 9/24; 2 week f/u. doing well. right foot is  closed. left foot still undermined. 10/14; right foot remains closed at the fifth met head. The area  over the base of the left fifth metatarsal has a small open area but considerable undermining towards the plantar foot. Thick callus skin around this suggests an adequate pressure relief. We have talked about this. He says he is going to go back into his cam boot. I suggested a total contact cast he did not seem enamored with this suggestion 10/26; left foot base of the fifth metatarsal. Same condition as last time. He has skin over the area with an open wound however the skin is not adherent. He went to see Dr. Earleen Newport who did an x-ray and culture of his foot I have not reviewed the x-ray but the patient was not told anything. He is on doxycycline 11/11; since the patient was last here he was in the emergency room on 10/30 he was concerned about swelling in the left foot. They did not do any cultures or x-rays. They changed his antibiotics to cephalexin. Previous culture showed group B strep. The cephalexin is appropriate as doxycycline has less than predictable coverage. Arrives in clinic today with swelling over this area under the wound. He also has a new wound on the right fifth metatarsal head 11/18; the patient has a difficult wound on the lateral aspect of the left fifth metatarsal head. The wound was almost ballotable last week I opened it slightly expecting to see purulence however there was just bleeding. I cultured this this was negative. X-ray unchanged. We are trying to get an MRI but I am not sure were going to be able to get this through his insurance. He also has an area on the right lateral fifth metatarsal head this looks healthier 12/3; the patient finally got our MRI. Surprisingly this did not show osteomyelitis. I did show the soft tissue ulceration at the lateral plantar aspect of the fifth metatarsal base with a tiny residual 6 mm abscess overlying the superficial fascia I have tried to culture this area I have not been able to get this to grow anything. Nevertheless the  protruding tissue looks aggravated. I suspect we should try to treat the underlying "abscess with broad-spectrum antibiotics. I am going to start him on Levaquin and Flagyl. He has much less edema in his legs and I am going to continue to wrap his legs and see him weekly 12/10. I started Levaquin and Flagyl on him last week. He just picked up the Flagyl apparently there was some delay. The worry is the wound on the left fifth metatarsal base which is substantial and worsening. His foot looks like he inverts at the ankle making this a weightbearing surface. Certainly no improvement in fact I think the measurements of this are somewhat worse. We have been using 12/17; he apparently just got the Levaquin yesterday this is 2 weeks after the fact. He has completed the Flagyl. The area over the left fifth metatarsal base still has protruding granulation tissue although it does not look quite as bad as it did some weeks ago. He has severe bilateral lymphedema although we have not been treating him for wounds on his legs this is definitely going to require compression. There was so much edema in the left I did not wish to put him in a total contact cast today. I am going to increase his compression from 3-4 layer. The area on the right lateral fifth met head actually look quite good and superficial. 12/23;  patient arrived with callus on the right fifth met head and the substantial hyper granulated callused wound on the base of his fifth metatarsal. He says he is completing his Levaquin in 2 days but I do not think that adds up with what I gave him but I will have to double check this. We are using Hydrofera Blue on both areas. My plan is to put the left leg in a cast the week after New Year's 04/06/2020; patient's wounds about the same. Right lateral fifth metatarsal head and left lateral foot over the base of the fifth metatarsal. There is undermining on the left lateral foot which I removed before application  of total contact cast continuing with Hydrofera Blue new. Patient tells me he was seen by endocrinology today lab work was done [Dr. Kerr]. Also wondering whether he was referred to cardiology. I went over some lab work from previously does not have chronic renal failure certainly not nephrotic range proteinuria he does have very poorly controlled diabetes but this is not his most updated lab work. Hemoglobin A1c has been over 11 1/10; the patient had a considerable amount of leakage towards mid part of his left foot with macerated skin however the wound surface looks better the area on the right lateral fifth met head is better as well. I am going to change the dressing on the left foot under the total contact cast to silver alginate, continue with Hydrofera Blue on the right. 1/20; patient was in the total contact cast for 10 days. Considerable amount of drainage although the skin around the wound does not look too bad on the left foot. The area on the right fifth metatarsal head is closed. Our nursing staff reports large amount of drainage out of the left lateral foot wound 1/25; continues with copious amounts of drainage described by our intake staff. PCR culture I did last week showed E. coli and Enterococcus faecalis and low quantities. Multiple resistance genes documented including extended spectrum beta lactamase, MRSA, MRSE, quinolone, tetracycline. The wound is not quite as good this week as it was 5 days ago but about the same size 2/3; continues with copious amounts of malodorous drainage per our intake nurse. The PCR culture I did 2 weeks ago showed E. coli and low quantities of Enterococcus. There were multiple resistance genes detected. I put Neosporin on him last week although this does not seem to have helped. The wound is slightly deeper today. Offloading continues to be an issue here although with the amount of drainage she has a total contact cast is just not going to work 2/10;  moderate amount of drainage. Patient reports he cannot get his stocking on over the dressing. I told him we have to do that the nurse gave him suggestions on how to make this work. The wound is on the bottom and lateral part of his left foot. Is cultured predominantly grew low amounts of Enterococcus, E. coli and anaerobes. There were multiple resistance genes detected including extended spectrum beta lactamase, quinolone, tetracycline. I could not think of an easy oral combination to address this so for now I am going to do topical antibiotics provided by [name] I think the main agents here are vancomycin and an aminoglycoside. We have to be able to give him access to the wounds to get the topical antibiotic on 2/17; moderate amount of drainage this is unchanged. He has his Keystone topical antibiotic against the deep tissue culture organisms. He has been using this  and changing the dressing daily. Silver alginate on the wound surface. 2/24; using Keystone antibiotic with silver alginate on the top. He had too much drainage for a total contact cast at one point although I think that is improving and I think in the next week or 2 it might be possible to replace a total contact cast I did not do this today. In general the wound surface looks healthy however he continues to have thick rims of skin and subcutaneous tissue around the wide area of the circumference which I debrided 06/04/2020 upon evaluation today patient appears to be doing well in regard to his wound. I do feel like he is showing signs of improvement. There is little bit of callus and dead tissue around the edges of the wound as well as what appears to be a little bit of a sinus tract that is off to the side laterally I would perform debridement to clear that away today. 3/17; left lateral foot. The wound looks about the same as I remember. Not much depth surface looks healthy. No evidence of infection 3/25; left lateral foot. Wound  surface looks about the same. Separating epithelium from the circumference. There really is no evidence of infection here however not making progress by my view 3/29; left lateral foot. Surface of the wound again looks reasonably healthy still thick skin and subcutaneous tissue around the wound margins. There is no Freels, [name] ([id number]) [id number] Page 14 of 21 evidence of infection. One of the concerns being brought up by the nurses has again the amount of drainage vis--vis continued use of a total contact cast 4/5; left lateral foot at roughly the base of the fifth metatarsal. Nice healthy looking granulated tissue with rims of epithelialization. The overall wound measurements are not any better but the tissue looks healthy. The only concern is the amount of drainage although he has no surrounding maceration with what we have been doing recently to absorb fluid and protect his skin. He also has lymphedema. 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 He states that the kerlix/coban under the TCC rolled down and could not get it back up. He has been using an offloading scooter and has somehow hurt his right foot using this device. This happened last week. He states that the side of his right foot developed a blister and opened. The top of his foot also has a few small open wounds he thinks is due to his socks rubbing in his shoes. He has not been using any dressings to the wound. He denies purulent drainage, fever/chills or erythema to the wounds. 4/22; patient presents for 1 week follow-up. He developed new wounds to the right foot that were evaluated at last clinic visit. He continues to have a total contact cast to the  left leg and he reports no issues. He has been using silver collagen to the right foot wounds with no issues. He denies purulent drainage, fever/chills or erythema to the right foot wounds. He has no complaints today 4/25; patient presents for 1 week follow-up. He has a total contact cast of the left leg and reports no issues. He has been using silver alginate to the right foot wound. He denies purulent drainage, fever/chills  or erythema to the right foot wounds. 5/2 patient presents for 1 week follow-up. T contact cast on the left. The wound which is on the base of the plantar foot at the base of the fifth metatarsal otal actually looks quite good and dimensions continue to gradually contract. HOWEVER the area on the right lateral fifth metatarsal head is much larger than what I remember from 2 weeks ago. Once more is he has significant levels of hypergranulation. Noteworthy that he had this same hyper granulated response on his wound on the left foot at one point in time. So much so that he I thought there was an underlying fluid collection. Based on this I think this just needs debridement. 5/9; the wound on the left actually continues to be gradually smaller with a healthy surface. Slight amount of drainage and maceration of the skin around but not too bad. However he has a large wound over the right fifth metatarsal head very much in the same configuration as his left foot wound was initially. I used silver nitrate to address the hyper granulated tissue no mechanical debridement 5/16; area on the left foot did not look as healthy this week deeper thick surrounding macerated skin and subcutaneous tissue. oo The area on the right foot fifth met head was about the same oo The area on the right ankle that we identified last week is completely broken down into an open wound presumably a stocking rubbing issue 5/23; patient has been using a total contact cast to the left side. He has been using  silver alginate underneath. He has also been using silver alginate to the right foot wounds. He has no complaints today. He denies any signs of infection. 5/31; the left-sided wound looks some better measure smaller surface granulation looks better. We have been using silver alginate under the total contact cast oo The large area on his right fifth met head and right dorsal foot look about the same still using silver alginate 6/6; neither side is good as I was hoping although the surface area dimensions are better. A lot of maceration on his left and right foot around the wound edge. Area on the dorsal right foot looks better. He says he was traveling. I am not sure what does the amount of maceration around the plantar wounds may be drainage issues 6/13; in general the wound surfaces look quite good on both sides. Macerated skin and raised edges around the wound required debridement although in general especially on the left the surface area seems improved. oo The area on the right dorsal ankle is about the same I thought this would not be such a problem to close 6/20; not much change in either wound although the one on the right looks a little better. Both wounds have thick macerated edges to the skin requiring debridements. We have been using silver alginate. The area on the dorsal right ankle is still open I thought this would be closed. 6/28; patient comes in today with a marked deterioration in the right foot wound fifth met head. Wide area of exposed bone this is a drastic change from last time. The area on the left there we have been casting is stagnant. We have been using silver alginate in both wound areas. 7/5; bone culture I did for PCR last time was positive for Pseudomonas, group B strep, Enterococcus and Staph aureus. There was no suggestion of methicillin resistance or ampicillin resistant genes. This was resistant to tetracycline however He comes into  the clinic today with the area  over his right plantar fifth metatarsal head which had been doing so well 2 weeks ago completely necrotic feeling bone. I do not know that this is going to be salvageable. The left foot wound is certainly no smaller but it has a better surface and is superficial. 7/8; patient called in this morning to say that his total contact cast was rubbing against his foot. He states he is doing fine overall. He denies signs of infection. 7/12; continued deterioration in the wound over the right fifth metatarsal head crumbling bone. This is not going to be salvageable. The patient agrees and wants to be referred to Dr. Doran Durand which we will attempt to arrange as soon as possible. I am going to continue him on antibiotics as long as that takes so I will renew those today. The area on the left foot which is the base of the fifth metatarsal continues to look somewhat better. Healthy looking tissue no depth no debridement is necessary here. 7/20; the patient was kindly seen by Dr. Doran Durand of orthopedics on 10/19/2020. 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 Towards this end we have taken him out of the total contact cast on the left we will put him in a wrap with Hydrofera Blue. As I understand things surgery is planned for 7/21 7/27; patient had his surgery last Thursday. 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 Apparently everything went well we did not still disturb that today The area on the left foot actually looks quite good. He has been much less mobile which probably explains this he did not seem to do well in the total contact cast secondary to drainage and maceration I think. 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 Fortunately there is no evidence of active infection at this time. No fevers, chills,  nausea, vomiting, or diarrhea. Overall I think that he is actually doing extremely well. Nonetheless I do believe that he is staying off of this more following the surgery in his right foot that is the reason the left is doing so great. 8/16; left plantar foot wound. This looks smaller than the last time I saw this he is using Hydrofera Blue. The surgical wound on the right foot is being followed by Dr. Doran Durand we did not look at this today. He has surgical shoes on both feet 8/23; left plantar foot wound not as good this week. Surrounding macerated skin and subcutaneous tissue everything looks moist and wet. I do not think he is offloading this adequately. He is using a surgical shoe Apparently the right foot surgical wound is not open although I did not check his foot 8/31; left plantar foot lateral aspect. Much improved this week. He has no maceration. Some improvement in the surface area of the wound but most impressively the depth is come in we are using silver alginate. The patient is a Product/process development scientist. He is asked that we write him a letter so he can go back to work. I have also tried to see if we can write something that will allow him to limit the amount of time that he is on his foot at work. 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 Hopefully the latter can be avoided 9/6; I believe the patient missed an appointment last week. He arrives in today with a wound looking roughly the same certainly no better. Undermining laterally and also inferiorly. We used molecuLight today in training with the patient's permission.. We are using silver alginate Maffia, [name] ([id number]) [id number] Page 15 of 21 9/21 wound is measuring bigger this week although this may have to do with the aggressive circumferential debridement last week in response to the blush fluorescence on the  MolecuLight. Culture I did last week showed significant MSSA and E. coli. I put him on Augmentin but he has not started it yet. We are also going to send this for compounded antibiotics at [facility]. There is no evidence of systemic infection 9/29; silver alginate. His Keystone arrived. He is completing Augmentin in 2 days. Offloading in a cam boot. Moderate drainage per our intake staff 10/5; using silver alginate. He has been using his [place]. He has completed his Augmentin. Per our intake nurse still a lot of drainage, far too much to consider a total contact cast. Wound measures about the same. He had the same undermining area that I defined last week from a roughly 11-3. I remove this today 10/12; using silver alginate he is using the [place]. He comes in for a nurse visit hence we are applying [employer] twice a week. Measuring slightly better today and less notable drainage. Extensive debridement of the wound edge last time 10/18; using topical Keystone and silver alginate and a soft cast. Wound measurements about the same. Drainage was through his soft cast. We are changing this twice a week Tuesdays and Friday 10/25; comes in with moderate drainage. Still using Keystone silver alginate and a soft cast. Wound dimensions completely the same.He has a lot of edema in the left leg he has lymphedema. Asking for [name] to consider wrapping him as he cannot get his stocking on over the soft cast 11/2; comes in with moderate to large drainage slightly smaller in terms of width we have been using [place]. His wound looks satisfactory but not much improvement 11/4; patient presents today for obligatory cast change. Has no issues or complaints today. He denies signs of infection. 11/9; patient traveled this weekend to [place], was on the cast quite a bit. Staining of the cast with black material from his walking boot. Drainage was not quite as bad as we feared. Using silver alginate and Keystone 11/16; we do  not have size for cast therefore we have been putting a soft cast on him since the change on Friday. Still a significant amount of drainage necessitating changing twice a week. We have been using the Keystone at cast changes either hard or soft as well as silver alginate Comes in the clinic with things actually looking fairly good improvement in width. He says his offloading is about the same 02/24/2021 upon evaluation today patient actually comes back in and is doing excellent in regard to his foot ulcer this is significantly smaller even compared to the last visit. The soft cast seems to have done extremely well for him which is great news. I do not see any signs of infection minimal debridement will be needed today. 11/30; left lateral foot much improved half a centimeter improvement in surface area. No evidence of infection. He seems to be doing better with the soft cast in the TCC therefore we will continue with this. He comes back in  later in the week for a change with the nurses. This is due to drainage 12/6; no improvement in dimensions. Under illumination some debris on the surface we have been using silver alginate, soft cast. If there is anything optimistic here he seems to have have less drainage 12/13. Dimensions are improved both length and width and slightly in depth. Appears to be quite healthy today. Raised edges of this thick skin and callus around the edges however. He is in a soft cast were bringing him back once for a change on Friday. Drainage is better 12/20. Dimensions are improved. He still has raised edges of thick skin and callus around the edges. We are using a soft cast 12/28; comes in today with thick callus around the wound. Using silver under alginate under a soft cast. I do not think there is much improvement in any measurement 2023 04/06/2021; patient was put in a total contact cast. Unfortunately not much change in surface area 1/10; not much different still thick  callus and skin around the edge in spite of the total contact cast. This was just debrided last week we have been using the [facility] compounded antibiotic and silver alginate under a total contact cast 1/18 the patient's wound on the left side is doing nicely. smaller HOWEVER he comes in today with a wound on the right foot laterally. blister most likely serosangquenous drainage 1/24; the patient continues to do well in terms of the plantar left foot which is continued to contract using silver alginate under the total contact cast HOWEVER the right lateral foot is bigger with denuded skin around the edges. I used pickups and a #15 scalpel to remove this this looks like the remanence of a large blister. Cannot rule out infection. Culture in this area I did last week showed Staphylococcus lugdunensis few colonies. I am going to try to address this with his [employer] antibiotic that is done so well on the left having linezolid and this should cover the staph 2/1; the patient's wound on his left foot which was the original plantar foot wound thick skin and eschar around the edges even in the total contact cast but the wound surface does not look too bad The real problem is on how his right lateral foot at roughly the base of the fifth metatarsal. The wound is completely necrotic more worrisome than that there is swelling around the edges of this. We have been using silver alginate on both wounds and Keystone on the right foot. Unfortunately I think he is going to require systemic antibiotics while we await cultures. He did not get the x-ray done that we ordered last week [lost the prescription 2/7; disappointingly in the area on the left foot which we are treating with a total contact cast is still not closed although it is much smaller. He continues to have a lot of callus around the wound edge. -Right lateral foot culture I did last week was negative x-ray also negative for osteomyelitis. 2/15: TCC  silver alginate on the left and silver alginate on the right lateral. No real improvement in either area 05/26/2021: T oday, the wounds are roughly the same size as at his previous visit, post-debridement. He continues to endorse fairly substantial drainage, particularly on the right. He has been in a total contact cast on the left. There is still some callus surrounding this lesion. On the right, the periwound skin is quite macerated, along with surrounding callus. The center of the right-sided wound also has  some dark, densely adherent material, which is very difficult to remove. 06/02/2021: Today, both wounds are slightly smaller. He has been using zinc oxide ointment around the right ulcer and the degree of maceration has improved markedly. There continues to be an area of nonviable tissue in the center of the right sided ulcer. The left-sided wound, which has been in the total contact cast. Appears clean and the degree of callus around it is less than previously. 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 He had to take the stairs and both wounds have increased in size. The left foot, which has been in a total contact cast, has developed a tunnel tracking to the lateral aspect of his foot. The nonviable tissue in the center of the right-sided ulcer remains recalcitrant to debridement. There is significant undermining surrounding the entirety of the left sided wound. 06/16/2021: The elevator at school has been fixed and the patient has been able to avoid putting as much weight on his wounds over the past week. We converted the left foot wound into a single lesion today, but despite this, the wound is actually smaller. The base is healthy with limited periwound callus. On the right, the central necrotic area is still present. He continues to be quite macerated around the right sided wound, despite applying barrier cream. This  does, however, have the benefit of softening the callus to make it more easily removable. 06/23/2021: Today, the left wound is smaller. The lateral aspect that had opened up previously is now closed. The wound base has a healthy bed of granulation tissue and minimal slough. Unfortunately, on the right, the wound is larger and continues to be fairly macerated. He has also reopened the wound at his right ankle. He thinks this is due to the gait he has adopted secondary to his total contact cast and boot. 06/30/2021: T oday, both wounds are a little bit larger. The lateral aspect on the left has remained closed. He continues to have significant periwound maceration. The culture that I took from the right sided wound grew a population of bacteria that is not covered by his current [facility] antibiotic. The center of the right- sided wound continues to appear necrotic with nonviable fat. It probes deeper today, but does not reach bone. [place], [name] ([id number]) [id number] Page 16 of 21 07/07/2021: The periwound maceration is a little bit less today. The right lateral foot wound has some areas that appear more viable and the necrotic center also looks a little bit better. The wound on the dorsal surface of his right foot near the ankle is contracting and the surface appears healthy. The left plantar wound surface looks healthy, but there is some new undermining on the medial portion. He did get his new Keystone antibiotic and began applying that to the right foot wound on Saturday. 07/14/2021: The intake nurse reported substantial drainage from his wounds, but the periwound skin actually looks better than is typical for him. The wound on the dorsal surface of his right foot near the ankle is smaller and just has a small open area underneath some dried eschar. The left plantar wound surface looks healthy and there has been no significant accumulation of callus. The right lateral foot  wound looks quite a bit better, with the central portion, which typically appears necrotic, looking more viable albeit pale. 07/22/2021: His left foot is extremely macerated today. The wound is about the same size. The wound  on the dorsal surface of his right foot near the ankle had closed, but he traumatized it removing the dressing and there is a tiny skin tear in that location. The right lateral foot wound is bigger, but the surface appears healthy. 07/30/2021: The wound on the dorsal surface of his right foot near the ankle is closed. The right lateral foot wound again is a little bit bigger due to some undermining. The periwound skin is in better condition, however. He has been applying zinc oxide. The wound surface is a little bit dry today. On the left, he does not have the substantial maceration that we frequently see. The wound itself is smaller and has a clean surface. 08/06/2021: Both wounds seem to have deteriorated over the past week. The right lateral foot wound has a dry surface but the periwound is boggy.. Overall wound dimensions are about the same. On the left, the wound is about the same size, but there is more undermining present underneath periwound callus. 08/13/2021: The right sided wound looks about the same, but on the left there has been substantial deterioration. The undermining continues to extend under periwound callus. Once this was removed, substantial extension of the wound was present. There is no odor or purulent drainage but clearly the wounds have broken down. 08/20/2021: The wounds look about the same today. He has been out of his total contact cast and has just been changing the dressings using topical Keystone with PolyMem Ag, Kerlix and Ace bandages. The wound on the top of his right ankle has reopened but this is quite small. There was a little bit of purulent material that I expressed when examining this wound. 08/24/2021: After the aggressive debridement I  performed at his last visit, the wounds actually look a little bit better today. They are smaller with the exception of the wound on the top of his right ankle which is a little bit bigger as some more skin pulled off when he was changing his dressing. We are using topical Keystone with PolyMem Ag Kerlix and Ace bandages. 09/02/2021: There has been really no change to any of his wounds. 09/16/2021: The patient was hospitalized last week with nausea, vomiting, and dehydration. He says that while he was in the hospital, his wounds were not really addressed properly. T oday, both plantar foot wounds are larger and the periwound skin is macerated. The wound on the dorsum of his right foot has a scab on the top. The right foot now has a crater where previously he had had nonviable fat. It looks as though this simply died and fell out. The periwound callus is wet. 09/24/2021: His wounds have deteriorated somewhat since his last visit. The wound on the dorsum of his right foot near his ankle is larger and has more nonviable tissue present. The crater in his right foot is even deeper; I cannot quite palpate or probe to bone but I am sure it is close. The wound on his left plantar foot has an odd boggy area in the center that almost feels as though it has fluid within it. He has run out of his topical Keystone antibiotic. We are using silver alginate on his wounds. 09/29/2021: He has developed a new wound on the dorsum of his left foot near his ankle. He says he thinks his wrapping is rubbing in that site. I would concur with this as the wound on his right ankle is larger. The left foot looks about the same. The right foot  has the crater that was present last week. No significant slough accumulation, but his foot remains quite swollen and warm despite oral antibiotic therapy. 10/08/2021: All of his wounds look about the same as last week. He did not start his oral antibiotics that are prescribed until just a couple of  days ago; his [employer] compounded antibiotics formula has been changed and he is awaiting delivery of the new recipe. His MRI that was scheduled for earlier this week was canceled as no prior authorization had been obtained; unfortunately the tech responsible sent an email to my old [place] email, which I no longer use nor have access to. 10/18/2021: The wounds on his bilateral dorsal feet near the ankles are both improved. They are smaller and have just some eschar and slough buildup. The left plantar wound has a fair amount of undermining, but the surface is clean. There is some periwound callus accumulation. On the right plantar foot, there is nonviable fat leading to a deep tunnel that tracks towards his dorsal medial foot. There is periwound callus and slough accumulation, as well. His right foot and leg remain swollen as compared to the left. 10/25/2021: The wounds on his bilateral dorsal feet and at the ankles have broken down somewhat. They are little bit larger than last week. The left plantar wound continues to undermine laterally but the surface is clean. The right plantar foot wound shows some decreased depth in the tunnel tracking towards his dorsal medial foot. He has not yet had the Doppler study that I ordered; it sounds like there is some confusion about the scheduling of the procedure. In addition, the MRI was denied and I have taken steps to appeal the denial. 11/24/2021: Since our last visit, Mr. Lieurance was admitted to the hospital where an MRI suggested osteomyelitis. He was taken the operating room by podiatry. Bone biopsies were negative for osteomyelitis. They debrided his wounds and applied myriad matrix. He saw them last week and they removed his staples. He is here today to continue his wound healing process. T oday, both of the dorsal foot/ankle wounds are substantially smaller. There is just a little eschar overlying the left sided wound and some eschar and slough on  the right. The right plantar foot ulcer has the healthiest surface of granulation tissue that I have seen to date. A portion of the myriad matrix failed to take and was hanging loose. It appears that myriad morcells were placed into the tunnel closest to the dorsal portion of his foot. These have sloughed off. The left plantar foot ulcer is about the same size, but has a much healthier surface than in the past. Both plantar ulcers have callus and slough accumulation. 12/07/2021: Left dorsal foot/ankle wound is closed. The right dorsal foot/ankle wound is nearly closed and just has a small open area with some eschar and slough. The right plantar foot wound has contracted quite a bit since our last visit. It has a healthy surface with just a little bit of slough accumulation and periwound callus buildup. The left plantar foot wound is about the same size but the surface appears healthy. There is a little slough and periwound callus on this side, as well. 12/15/2021: Both dorsal foot/ankle wounds are closed. The right plantar foot wound is substantially smaller than at our last visit. The tunneling that was present has nearly closed. There is just a little bit of slough buildup. The left plantar foot wound is also a little bit smaller today. The  surface is the healthiest that I have ever seen it. Light slough and periwound callus accumulation on this side. 12/22/2021: The dorsal ankle/foot wounds remain closed. The right plantar foot wound continues to contract. There is still a bit of depth at the lateral portion of the wound but the surface has a good granulated appearance. The wound on the left is about the same size, the central indented portion is still adherent. It also is clean with good granulation tissue present. The periwound skin and callus are a bit macerated, however. 12/29/2021: Both ankle wounds remain closed. The right plantar foot wound is less than half the size that it was last week. There  is no depth any longer and the surface has just a little bit of slough and periwound callus. On the left, the wound is not much smaller, but the surface is healthier with good granulation tissue. There is also a little slough and periwound callus buildup. 01/05/2022: The right plantar foot wound continues to contract and is once again about half the size as it was last week. There is just a little bit of surface slough and periwound callus. On the left, the depth of the wound has decreased and the diameter has started to contract. It is also very clean with just a little slough and biofilm present. [place], [name] ([id number]) [id number] Page 17 of 21 01/12/2022: The right plantar foot wound is smaller again today. There is just some periwound callus and slough accumulation. On the left, there is a fair amount of undermining and the surface is a little boggy, but no other significant change. 01/19/2022: The right plantar wound continues to contract. There is minimal periwound callus accumulation and just a light layer of slough on the surface. On the left, the surface looks better, but there is fairly significant undermining near the 11:00 portion of the wound, aiming towards his great toe. The skin overlying this area is very healthy and has a good fat layer present. No malodor or purulent drainage. 01/26/2022: The right sided wound continues to contract and has just a light layer of slough on the surface. Minimal periwound callus. On the left, he still has substantial undermining and the tissue surface is not as robust as I would like to see. No malodor or purulent drainage, but he did say he had a lot of serous drainage over the weekend. 02/02/2022: The right foot wound is about a quarter of the size that it was at his last visit. There is just a little slough on the surface and some periwound callus. On the left, the undermining persists and the tissue is still more  pale, but he does not seem to have had as much drainage over the last weekend. We are waiting on a wound VAC. 02/09/2022: His right foot has healed. He received the wound VAC, but did not bring it to clinic with him today. The lateral aspect of the left foot wound has come in a little, but he still has substantial undermining on the medial and distal portion of the wound. Still with macerated periwound callus. 02/16/2022: The wound VAC was applied last Friday. T oday, there has been tremendous improvement in the surface of the wound bed. The undermined portion of the wound has come in a bit. There is still some overhanging dead skin. Overall the wound looks much better. Patient History Information obtained from Patient. Family History No family history of Cancer, Diabetes, Hereditary Spherocytosis, Hypertension, Kidney Disease, Lung Disease, Seizures, Stroke, Thyroid Problems,  Tuberculosis. Social History Never smoker, Marital Status - Single, Alcohol Use - Rarely, Drug Use - No History, Caffeine Use - Never. Medical History Eyes Denies history of Cataracts, Glaucoma, Optic Neuritis Ear/Nose/Mouth/Throat Denies history of Chronic sinus problems/congestion, Middle ear problems Hematologic/Lymphatic Denies history of Anemia, Hemophilia, Human Immunodeficiency Virus, Lymphedema, Sickle Cell Disease Respiratory Denies history of Aspiration, Asthma, Chronic Obstructive Pulmonary Disease (COPD), Pneumothorax, Sleep Apnea, Tuberculosis Cardiovascular Denies history of Angina, Arrhythmia, Congestive Heart Failure, Coronary Artery Disease, Deep Vein Thrombosis, Hypertension, Hypotension, Myocardial Infarction, Peripheral Arterial Disease, Peripheral Venous Disease, Phlebitis, Vasculitis Gastrointestinal Denies history of Cirrhosis , Colitis, Crohnoos, Hepatitis A, Hepatitis B, Hepatitis C Endocrine Patient has history of Type II Diabetes Denies history of Type I Diabetes Immunological Denies  history of Lupus Erythematosus, Raynaudoos, Scleroderma Integumentary (Skin) Denies history of History of Burn Musculoskeletal Denies history of Gout, Rheumatoid Arthritis, Osteoarthritis, Osteomyelitis Neurologic Denies history of Dementia, Neuropathy, Quadriplegia, Paraplegia, Seizure Disorder Oncologic Denies history of Received Chemotherapy, Received Radiation Psychiatric Denies history of Anorexia/bulimia, Confinement Anxiety Hospitalization/Surgery History - 11/1-11/06/2018- sepsis foot infection. - 11/4-11/5 02 sats low respiratory distress. Objective Constitutional Slightly hypertensive. No acute distress.. Vitals Time Taken: 8:29 AM, Height: 77 in, Weight: 280 lbs, BMI: 33.2, Temperature: 98.5 F, Pulse: 83 bpm, Respiratory Rate: 18 breaths/min, Blood Pressure: 147/92 mmHg, Capillary Blood Glucose: 67 mg/dl. Respiratory Normal work of breathing on room air.. General Notes: 02/16/2022: There has been tremendous improvement in the surface of the wound bed. The undermined portion of the wound has come in a bit. There is still some overhanging dead skin. Overall the wound looks much better. Integumentary (Hair, Skin) Wound #3 status is Open. Original cause of wound was Trauma. The date acquired was: 10/02/2019. The wound has been in treatment 120 weeks. The wound is [place], [name] ([id number]) [id number] Page 18 of 21 located on the Left,Lateral,Plantar Foot. The wound measures 3cm length x 3cm width x 0.7cm depth; 7.069cm^2 area and 4.948cm^3 volume. There is Fat Layer (Subcutaneous Tissue) exposed. There is no tunneling noted, however, there is undermining starting at 10:00 and ending at 12:00 with a maximum distance of 1cm. There is a medium amount of serosanguineous drainage noted. The wound margin is distinct with the outline attached to the wound base. There is large (67-100%) red granulation within the wound bed. There is no necrotic tissue within  the wound bed. The periwound skin appearance exhibited: Maceration. Periwound temperature was noted as No Abnormality. 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 There was a Selective/Open Wound Skin/Epidermis Debridement with a total area of 3 sq cm performed by [name], MD. With the following instrument(s): Curette to remove Non-Viable tissue/material. Material removed includes Skin: Epidermis. No specimens were taken. A time out was conducted at 08:50, prior to the start of the procedure. A Minimum amount of bleeding was controlled with Pressure. The procedure was tolerated well with a pain level of 0 throughout and a pain level of 0 following the procedure. Post Debridement Measurements: 3cm length x 3cm width x 0.7cm depth; 4.948cm^3 volume. Character of Wound/Ulcer Post Debridement is improved. Severity of Tissue Post Debridement is: Fat layer exposed. Post procedure Diagnosis  Wound #3: Same as Pre-Procedure General Notes: scribed by Baruch Gouty, RN for Dr. [name]. Plan Follow-up Appointments: Return Appointment in 1 week. - Dr. [name] - Room 1 with Delice Lesch 11/22 @ 08:15 am Bathing/ Shower/ Hygiene: May shower and wash wound with soap and water. Negative Presssure Wound Therapy: Wound #3 Left,Lateral,Plantar Foot: Wound Vac to wound continuously at 122m/hg pressure Black Foam Edema Control - Lymphedema / SCD / Other: Elevate legs to the level of the heart or above for 30 minutes daily and/or when sitting, a frequency of: - throughout the day Avoid standing for long  periods of time. Exercise regularly Compression stocking or Garment 20-30 mm/Hg pressure to: - to both legs daily Off-Loading: Open toe surgical shoe to: - Both feet Additional Orders / Instructions: Follow Nutritious Diet Non Wound Condition: Apply the following to affected area as directed: - continue to apply foam donut or cushion to healed right plantar foot WOUND #3: - Foot Wound Laterality: Plantar, Left, Lateral Cleanser: Soap and Water 3 x Per Week/30 Days Discharge Instructions: May shower and wash wound with dial antibacterial soap and water prior to dressing change. Prim Dressing: VAC 3 x Per Week/30 Days ary Com pression Wrap: Kerlix Roll 4.5x3.1 (in/yd) (Generic) 3 x Per Week/30 Days Discharge Instructions: Apply Kerlix and Coban compression as directed. 02/16/2022: There has been tremendous improvement in the surface of the wound bed. The undermined portion of the wound has come in a bit. There is still some overhanging dead skin. Overall the wound looks much better. I debrided the overhanging dead skin using a curette. We will continue to use the wound VAC. He will have a nurse visit for a wound VAC change and then I will see him in 1 week from today. He is responding very well to negative pressure wound therapy. Electronic Signature(s) Signed: 02/16/2022 5:37:22 PM By: BBaruch GoutyRN, BSN Signed: 02/17/2022 7:46:48 AM By: CFredirick MaudlinMD FACS Previous Signature: 02/16/2022 9:11:13 AM Version By: CFredirick MaudlinMD FACS Entered By: BBaruch Goutyon 02/16/2022 17:36:00 ALesage CMali([id number] Page 19 of 21 -------------------------------------------------------------------------------- HxROS Details Patient Name: Date of Service: AThom ChimesD 02/16/2022 8:15 A M Medical Record Number: [id number] Account Number: [id number] of Birth/Sex: Treating RN: [birth year](36y.o. M) Primary Care Provider:  PSeward CarolOther Clinician: Referring Provider: Treating Provider/Extender: COsborn Cohoin Treatment: 120 Information Obtained From Patient Eyes Medical History: Negative for: Cataracts; Glaucoma; Optic Neuritis Ear/Nose/Mouth/Throat Medical History: Negative for: Chronic sinus problems/congestion; Middle ear problems Hematologic/Lymphatic Medical History: Negative for: Anemia; Hemophilia; Human Immunodeficiency Virus; Lymphedema; Sickle Cell Disease Respiratory Medical History: Negative for: Aspiration; Asthma; Chronic Obstructive Pulmonary Disease (COPD); Pneumothorax; Sleep Apnea; Tuberculosis Cardiovascular Medical History: Negative for: Angina; Arrhythmia; Congestive Heart Failure; Coronary Artery Disease; Deep Vein Thrombosis; Hypertension; Hypotension; Myocardial Infarction; Peripheral Arterial Disease; Peripheral Venous Disease; Phlebitis; Vasculitis Gastrointestinal Medical History: Negative for: Cirrhosis ; Colitis; Crohns; Hepatitis A; Hepatitis B; Hepatitis C Endocrine Medical History: Positive for: Type II Diabetes Negative for: Type I Diabetes Time with diabetes: 8 Treated with: Insulin Blood sugar tested every day: No Immunological Medical History: Negative for: Lupus Erythematosus; Raynauds; Scleroderma Integumentary (Skin) Medical History: Negative for: History of Burn Musculoskeletal Medical History: Negative for: Gout; Rheumatoid Arthritis; Osteoarthritis; Osteomyelitis Neurologic Medical History: Demello, CMali([id number] Page 20 of 21 Negative for: Dementia; Neuropathy; Quadriplegia; Paraplegia; Seizure Disorder Oncologic Medical History: Negative for: Received Chemotherapy; Received Radiation Psychiatric Medical History: Negative for: Anorexia/bulimia; Confinement Anxiety  Immunizations Pneumococcal Vaccine: Received Pneumococcal Vaccination: No Implantable  Devices None Hospitalization / Surgery History Type of Hospitalization/Surgery 11/1-11/06/2018- sepsis foot infection 11/4-11/5 02 sats low respiratory distress Family and Social History Cancer: No; Diabetes: No; Hereditary Spherocytosis: No; Hypertension: No; Kidney Disease: No; Lung Disease: No; Seizures: No; Stroke: No; Thyroid Problems: No; Tuberculosis: No; Never smoker; Marital Status - Single; Alcohol Use: Rarely; Drug Use: No History; Caffeine Use: Never; Financial Concerns: No; Food, Clothing or Shelter Needs: No; Support System Lacking: No; Transportation Concerns: No Electronic Signature(s) Signed: 02/16/2022 9:48:09 AM By: [name] MD FACS Entered By: [name] on 02/16/2022 09:09:16 -------------------------------------------------------------------------------- SuperBill Details Patient Name: Date of Service: [name], [name] 02/16/2022 Medical Record Number: [id number] Patient Account Number: [id number] Date of Birth/Sex: Treating RN: 30-Sep-1985 (36 y.o. 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 L97.518 Non-pressure chronic ulcer of other part of right foot with other specified severity Facility Procedures : CPT4 Code: 10034961 Description: 16435 - DEBRIDE WOUND 1ST 20 SQ CM OR < ICD-10 Diagnosis Description L97.528 Non-pressure chronic ulcer of other part of left foot with other specified severi Modifier: ty Quantity: 1 : CPT4 Code: 39122583 Description: 46219 - WOUND VAC-50 SQ CM OR LESS Modifier: Quantity: 1 Physician Procedures : CPT4 Code Description Modifier [id number] 12929 - WC PHYS LEVEL 4 - EST PT 25 [place], [name] ([id number])  [id number] ICD-10 Diagnosis Description L97.528 Non-pressure chronic ulcer of other part of left foot with other specified  severity E11.621 Type 2 diabetes mellitus with foot ulcer Quantity: 1 df Page 21 of 21 : [id number] 17981 - WC PHYS DEBR WO ANESTH 20 SQ CM 1 ICD-10 Diagnosis Description L97.528 Non-pressure chronic ulcer of other part of left foot with other specified severity Quantity: Electronic Signature(s) Signed: 02/16/2022 9:11:44 AM By: [name] MD FACS Entered By: [name] on 02/16/2022 09:11:43

## 2022-02-17 NOTE — Progress Notes (Signed)
[name], [name] ([id number]) [id number] Page 1 of 8 Visit Report for 02/16/2022 Arrival Information Details Patient Name: Date of Service: [name] D 02/16/2022 8:15 A M Medical Record Number: [id number] Patient Account Number: [id number] Date of Birth/Sex: Treating RN: Dec 21, 1985 (36 y.o. [name] Primary Care Javian Nudd: [name] Other Clinician: Referring Karen Kinnard: Treating Keidrick Murty/Extender: [name] in Treatment: 31 Visit Information History Since Last Visit Added or deleted any medications: No Patient Arrived: Ambulatory Any new allergies or adverse reactions: No Arrival Time: 08:29 Had a fall or experienced change in No Accompanied By: friend activities of daily living that may affect Transfer Assistance: None risk of falls: Patient Identification Verified: Yes Signs or symptoms of abuse/neglect since last visito No Secondary Verification Process Completed: Yes Hospitalized since last visit: No Patient Requires Transmission-Based Precautions: No Implantable device outside of the clinic excluding No Patient Has Alerts: No cellular tissue based products placed in the center since last visit: Has Dressing in Place as Prescribed: Yes Pain Present Now: No Electronic Signature(s) Signed: 02/16/2022 5:06:12 PM By: Adline Peals Entered By: Adline Peals on 02/16/2022 08:29:28 -------------------------------------------------------------------------------- Encounter Discharge Information Details Patient Name: Date of Service: [name], [name] 02/16/2022 8:15 A M Medical Record Number: [id number] Patient Account Number: [id number] Date of Birth/Sex: Treating RN: June 18, 1985 (36 y.o. [name] Primary Care Kahlen Boyde: [name] Other Clinician: Referring [name]: Treating [name]/Extender: [name] in Treatment: 120 Encounter Discharge  Information Items Post Procedure Vitals Discharge Condition: Stable Temperature (F): 98.5 Ambulatory Status: Ambulatory Pulse (bpm): 83 Discharge Destination: Home Respiratory Rate (breaths/min): 18 Transportation: Private Auto Blood Pressure (mmHg): 147/92 Accompanied By: fiance Schedule Follow-up Appointment: Yes Clinical Summary of Care: Patient Declined Electronic Signature(s) Signed: 02/16/2022 5:37:22 PM By: Baruch Gouty RN, BSN Entered By: Baruch Gouty on 02/16/2022 09:17:51 [name], [name] ([id number]) [id number] Page 2 of 8 -------------------------------------------------------------------------------- Lower Extremity Assessment Details Patient Name: Date of Service: [name] D 02/16/2022 8:15 A M Medical Record Number: [id number] Patient Account Number: [id number] Date of Birth/Sex: Treating RN: [birth year] (36 y.o. [name] Primary Care Jonathan Corpus: [name] Other Clinician: Referring Brizeida Mcmurry: Treating Donaldson Richter/Extender: [name] in Treatment: 120 Edema Assessment Assessed: [name]: No] [name]: No] Edema: [Left: Yes] [Right: Yes] Calf Left: Right: Point of Measurement: 48 cm From Medial Instep 48 cm 48.5 cm Ankle Left: Right: Point of Measurement: 11 cm From Medial Instep 28.5 cm 27 cm Electronic Signature(s) Signed: 02/16/2022 5:06:12 PM By: Adline Peals Entered By: Adline Peals on 02/16/2022 08:33:58 -------------------------------------------------------------------------------- Multi Wound Chart Details Patient Name: Date of Service: [name], [name] 02/16/2022 8:15 A M Medical Record Number: [id number] Patient Account Number: [id number] Date of Birth/Sex: Treating RN: [birth year] (36 y.o. M) Primary Care [name]: [name] Other Clinician: Referring Nathanyel Defenbaugh: Treating Kamea Dacosta/Extender: [name] in Treatment: 120 Vital  Signs Height(in): 77 Capillary Blood Glucose(mg/dl): 67 Weight(lbs): 280 Pulse(bpm): 83 Body Mass Index(BMI): 33.2 Blood Pressure(mmHg): 147/92 Temperature(F): 98.5 Respiratory Rate(breaths/min): 18 [3:Photos:] [N/A:N/A] Left, Lateral, Plantar Foot N/A N/A Wound Location: Trauma N/A N/A Wounding Event: Diabetic Wound/Ulcer of the Lower N/A N/A Primary Etiology: Extremity Type II Diabetes N/A N/A Comorbid History: 10/02/2019 N/A N/A Date Acquired: [name], [name] ([id number]) [id number] Page 3 of 8 120 N/A N/A Weeks of Treatment: Open N/A N/A Wound Status: No N/A N/A Wound Recurrence: 3x3x0.7 N/A N/A Measurements L x W x D (cm) 7.069 N/A N/A A (cm) : rea 4.948 N/A N/A  Volume (cm) : -328.70% N/A N/A % Reduction in A rea: -2898.80% N/A N/A % Reduction in Volume: 10 Starting Position 1 (o'clock): 12 Ending Position 1 (o'clock): 1 Maximum Distance 1 (cm): Yes N/A N/A Undermining: Grade 2 N/A N/A Classification: Medium N/A N/A Exudate A mount: Serosanguineous N/A N/A Exudate Type: red, brown N/A N/A Exudate Color: Distinct, outline attached N/A N/A Wound Margin: Large (67-100%) N/A N/A Granulation A mount: Red N/A N/A Granulation Quality: None Present (0%) N/A N/A Necrotic A mount: Fat Layer (Subcutaneous Tissue): Yes N/A N/A Exposed Structures: Fascia: No Tendon: No Muscle: No Joint: No Bone: No Medium (34-66%) N/A N/A Epithelialization: Debridement - Selective/Open Wound N/A N/A Debridement: Pre-procedure Verification/Time Out 08:50 N/A N/A Taken: Skin/Epidermis N/A N/A Level: 3 N/A N/A Debridement A (sq cm): rea Curette N/A N/A Instrument: Minimum N/A N/A Bleeding: Pressure N/A N/A Hemostasis A chieved: 0 N/A N/A Procedural Pain: 0 N/A N/A Post Procedural Pain: Procedure was tolerated well N/A N/A Debridement Treatment Response: 3x3x0.7 N/A N/A Post Debridement Measurements L x W x D (cm) 4.948  N/A N/A Post Debridement Volume: (cm) Maceration: Yes N/A N/A Periwound Skin Moisture: No Abnormality N/A N/A Temperature: Debridement N/A N/A Procedures Performed: Negative Pressure Wound Therapy Maintenance (NPWT) Treatment Notes Electronic Signature(s) Signed: 02/16/2022 9:07:32 AM By: [name] MD FACS Entered By: [name] on 02/16/2022 09:07:32 -------------------------------------------------------------------------------- Multi-Disciplinary Care Plan Details Patient Name: Date of Service: [name], [name] 02/16/2022 8:15 A M Medical Record Number: [id number] Patient Account Number: [id number] Date of Birth/Sex: Treating RN: [birth year] (36 y.o. [name] Primary Care Jalynne Persico: [name] Other Clinician: Referring Kilyn Maragh: Treating Marisel Tostenson/Extender: [name] in Treatment: Abie reviewed with physician Active Inactive Nutrition Nursing Diagnoses: Imbalanced nutrition [place], [name] ([id number]) [id number] Page 4 of 8 Potential for alteratiion in Nutrition/Potential for imbalanced nutrition Goals: Patient/caregiver agrees to and verbalizes understanding of need to use nutritional supplements and/or vitamins as prescribed Date Initiated: 10/24/2019 Date Inactivated: 04/06/2020 Target Resolution Date: 04/03/2020 Goal Status: Met Patient/caregiver will maintain therapeutic glucose control Date Initiated: 10/24/2019 Target Resolution Date: 03/16/2022 Goal Status: Active Interventions: Assess HgA1c results as ordered upon admission and as needed Assess patient nutrition upon admission and as needed per policy Provide education on elevated blood sugars and impact on wound healing Provide education on nutrition Treatment Activities: Education provided on Nutrition : 12/07/2021 Notes: 11/17/20: Glucose control ongoing issue, target date extended. 01/26/21: Glucose  management continues. Wound/Skin Impairment Nursing Diagnoses: Impaired tissue integrity Knowledge deficit related to ulceration/compromised skin integrity Goals: Patient/caregiver will verbalize understanding of skin care regimen Date Initiated: 10/24/2019 Target Resolution Date: 03/16/2022 Goal Status: Active Ulcer/skin breakdown will have a volume reduction of 30% by week 4 Date Initiated: 10/24/2019 Date Inactivated: 01/16/2020 Target Resolution Date: 01/10/2020 Unmet Reason: no change in Goal Status: Unmet measurements. Interventions: Assess patient/caregiver ability to obtain necessary supplies Assess patient/caregiver ability to perform ulcer/skin care regimen upon admission and as needed Assess ulceration(s) every visit Provide education on ulcer and skin care Notes: 11/17/20: Wound care regimen continues Electronic Signature(s) Signed: 02/16/2022 5:37:22 PM By: Baruch Gouty RN, BSN Entered By: Baruch Gouty on 02/16/2022 08:58:22 -------------------------------------------------------------------------------- Negative Pressure Wound Therapy Maintenance (NPWT) Details Patient Name: Date of Service: [name] D 02/16/2022 8:15 A M Medical Record Number: [id number] Patient Account Number: [id number] Date of Birth/Sex: Treating RN: Dec 04, 1985 (35 y.o. [name] Primary Care Rimsha Trembley: [name] Other Clinician: Referring Aliannah Holstrom: Treating Raymel Cull/Extender: [name]  in Treatment: 120 NPWT Maintenance Performed for: Wound #3 Left, Lateral, Plantar Foot Performed By: Baruch Gouty, RN Type: VAC System Coverage Size (sq cm): 9 Pressure Type: Constant Pressure Setting: 125 mmHG Drain Type: None Sponge/Dressing Type: Foam, Black Date Initiated: 02/11/2022 Dressing Removed: No Quantity of Sponges/Gauze Removed: 1 [place], [name] ([id number]) [id number] Page 5 of 8 Canister Changed:  No Dressing Reapplied: No Quantity of Sponges/Gauze Inserted: 1 Respones T Treatment: o good Days On NPWT : 6 Post Procedure Diagnosis Same as Pre-procedure Electronic Signature(s) Signed: 02/16/2022 5:37:22 PM By: Baruch Gouty RN, BSN Entered By: Baruch Gouty on 02/16/2022 08:54:03 -------------------------------------------------------------------------------- Pain Assessment Details Patient Name: Date of Service: [name], [name] 02/16/2022 8:15 A M Medical Record Number: [id number] Patient Account Number: [id number] Date of Birth/Sex: Treating RN: 1986/04/02 (36 y.o. [name] Primary Care Madelon Welsch: [name] Other Clinician: Referring Maximilian Tallo: Treating Nishika Parkhurst/Extender: [name] in Treatment: 120 Active Problems Location of Pain Severity and Description of Pain Patient Has Paino No Site Locations Rate the pain. Current Pain Level: 0 Pain Management and Medication Current Pain Management: Electronic Signature(s) Signed: 02/16/2022 5:06:12 PM By: Adline Peals Entered By: Adline Peals on 02/16/2022 08:29:36 -------------------------------------------------------------------------------- Patient/Caregiver Education Details Patient Name: Date of Service: [name], [name] 11/15/2023andnbsp8:15 A M Medical Record Number: [id number] Patient Account Number: [id number] Date of Birth/Gender: Treating RN: 07-22-1985 (36 y.o. [name] [place], [name] ([id number]) [id number] Page 6 of 8 Primary Care Physician: [name] Other Clinician: Referring Physician: Treating Physician/Extender: [name] in Treatment: 120 Education Assessment Education Provided To: Patient Education Topics Provided Elevated Blood Sugar/ Impact on Healing: Methods: Explain/Verbal Responses: Reinforcements needed, State content correctly Offloading: Methods:  Explain/Verbal Responses: Reinforcements needed, State content correctly Wound/Skin Impairment: Methods: Explain/Verbal Responses: Reinforcements needed, State content correctly Electronic Signature(s) Signed: 02/16/2022 5:37:22 PM By: Baruch Gouty RN, BSN Entered By: Baruch Gouty on 02/16/2022 08:58:54 -------------------------------------------------------------------------------- Wound Assessment Details Patient Name: Date of Service: [name], [name] 02/16/2022 8:15 A M Medical Record Number: [id number] Patient Account Number: [id number] Date of Birth/Sex: Treating RN: 04-03-1986 (36 y.o. [name] Primary Care Deanthony Maull: [name] Other Clinician: Referring Dameir Gentzler: Treating Haydon Kalmar/Extender: [name] in Treatment: 120 Wound Status Wound Number: 3 Primary Etiology: Diabetic Wound/Ulcer of the Lower Extremity Wound Location: Left, Lateral, Plantar Foot Wound Status: Open Wounding Event: Trauma Comorbid History: Type II Diabetes Date Acquired: 10/02/2019 Weeks Of Treatment: 120 Clustered Wound: No Photos Wound Measurements Length: (cm) 3 Width: (cm) 3 Depth: (cm) 0.7 Area: (cm) 7.069 Volume: (cm) 4.948 Tech, [name] ([id number]) % Reduction in Area: -328.7% % Reduction in Volume: -2898.8% Epithelialization: Medium (34-66%) Tunneling: No Undermining: Yes [id number] Page 7 of 8 Starting Position (o'clock): 10 Ending Position (o'clock): 12 Maximum Distance: (cm) 1 Wound Description Classification: Grade 2 Wound Margin: Distinct, outline attached Exudate Amount: Medium Exudate Type: Serosanguineous Exudate Color: red, brown Foul Odor After Cleansing: No Slough/Fibrino No Wound Bed Granulation Amount: Large (67-100%) Exposed Structure Granulation Quality: Red Fascia Exposed: No Necrotic Amount: None Present (0%) Fat Layer (Subcutaneous Tissue) Exposed: Yes Tendon Exposed:  No Muscle Exposed: No Joint Exposed: No Bone Exposed: No Periwound Skin Texture Texture Color No Abnormalities Noted: No No Abnormalities Noted: No Moisture Temperature / Pain No Abnormalities Noted: No Temperature: No Abnormality Maceration: Yes Treatment Notes Wound #3 (Foot) Wound Laterality: Plantar, Left, Lateral Cleanser Soap and Water Discharge Instruction: May shower and wash wound with dial antibacterial soap and water  prior to dressing change. Peri-Wound Care Topical Primary Dressing VAC Secondary Dressing Secured With Kerlix Roll Sterile, 4.5x3.1 (in/yd) Discharge Instruction: Secure with Kerlix as directed. Compression Wrap Compression Stockings Add-Ons Electronic Signature(s) Signed: 02/16/2022 5:06:12 PM By: Adline Peals Entered By: Adline Peals on 02/16/2022 08:37:33 -------------------------------------------------------------------------------- Vitals Details Patient Name: Date of Service: [name], [name] 02/16/2022 8:15 A M Medical Record Number: [id number] Patient Account Number: [id number] Date of Birth/Sex: Treating RN: Apr 13, 1985 (36 y.o. [name] Primary Care Urban Naval: [name] Other Clinician: Referring Sugey Trevathan: Treating Adream Parzych/Extender: [name] in Treatment: [address] [name] ([id number]) [id number] Page 8 of 8 Vital Signs Time Taken: 08:29 Temperature (F): 98.5 Height (in): 77 Pulse (bpm): 83 Weight (lbs): 280 Respiratory Rate (breaths/min): 18 Body Mass Index (BMI): 33.2 Blood Pressure (mmHg): 147/92 Capillary Blood Glucose (mg/dl): 67 Reference Range: 80 - 120 mg / dl Electronic Signature(s) Signed: 02/16/2022 5:06:12 PM By: Adline Peals Entered By: Adline Peals on 02/16/2022 08:30:24

## 2022-02-23 ENCOUNTER — Encounter (HOSPITAL_BASED_OUTPATIENT_CLINIC_OR_DEPARTMENT_OTHER): Payer: BC Managed Care – PPO | Admitting: General Surgery

## 2022-02-23 DIAGNOSIS — E11621 Type 2 diabetes mellitus with foot ulcer: Secondary | ICD-10-CM | POA: Diagnosis not present

## 2022-02-23 NOTE — Progress Notes (Signed)
Pereira, [name] ([id number]) [id number] Page 1 of 21 Visit Report for 02/23/2022 Chief Complaint Document Details Patient Name: Date of Service: [name] 02/23/2022 8:15 A M Medical Record Number: [id number] Patient Account Number: [id number] Date of Birth/Sex: Treating RN: [birth year] (36 y.o. M) Primary Care Provider: [name] Other Clinician: Referring Provider: Treating Provider/Extender: [name] in Treatment: [place] from: Patient Chief Complaint 01/11/2019; patient is here for review of a rather substantial wound over the left fifth plantar metatarsal head extending into the lateral part of his foot 10/24/2019; patient returns to clinic with wounds on his bilateral feet with underlying osteomyelitis biopsy-proven Electronic Signature(s) Signed: 02/23/2022 9:04:14 AM By: [name] MD FACS Entered By: [name] on 02/23/2022 09:04:13 -------------------------------------------------------------------------------- Debridement Details Patient Name: Date of Service: [name], [name] 02/23/2022 8:15 A M Medical Record Number: [id number] Patient Account Number: [id number] Date of Birth/Sex: Treating RN: October 30, 1985 (36 y.o. [name] Primary Care Provider: [name] Other Clinician: Referring Provider: Treating Provider/Extender: [name] in Treatment: 121 Debridement Performed for Assessment: Wound #3 Left,Lateral,Plantar Foot Performed By: Physician [name], MD Debridement Type: Debridement Severity of Tissue Pre Debridement: Fat layer exposed Level of Consciousness (Pre-procedure): Awake and Alert Pre-procedure Verification/Time Out Yes - 08:55 Taken: Start Time: 08:55 T Area Debrided (L x W): otal 3.3 (cm) x 1 (cm) = 3.3 (cm) Tissue and other material debrided: Non-Viable, Callus, Skin: Epidermis Level:  Skin/Epidermis Debridement Description: Selective/Open Wound Instrument: Curette Bleeding: Minimum Hemostasis Achieved: Pressure Procedural Pain: 0 Post Procedural Pain: 0 Response to Treatment: Procedure was tolerated well Level of Consciousness (Post- Awake and Alert procedure): Post Debridement Measurements of Total Wound Length: (cm) 3.3 Width: (cm) 3.1 Depth: (cm) 1.2 Volume: (cm) 9.642 Character of Wound/Ulcer Post Debridement: Improved Severity of Tissue Post Debridement: Fat layer exposed [name], [name] ([id number]) [id number] Page 2 of 21 Post Procedure Diagnosis Same as Pre-procedure Notes scribed by Baruch Gouty, RN for Dr. [name] Electronic Signature(s) Signed: 02/23/2022 9:38:41 AM By: [name] MD FACS Signed: 02/23/2022 3:43:48 PM By: Baruch Gouty RN, BSN Entered By: Baruch Gouty on 02/23/2022 08:56:49 -------------------------------------------------------------------------------- HPI Details Patient Name: Date of Service: [name], [name] 02/23/2022 8:15 A M Medical Record Number: [id number] Patient Account Number: [id number] Date of Birth/Sex: Treating RN: [birth year] (36 y.o. 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 He comes in for review of a wound over the plantar fifth metatarsal head extending into the lateral part of the foot. He was followed for this previously by his podiatrist Dr. Cornelius Moras. As the patient tells his story he went to see podiatry first for a swelling he developed on the lateral part of his fifth metatarsal head in May. He states this was "open" by podiatry and the area closed. He was followed up in June and it was again opened  callus removed and it closed promptly. There were plans being made for surgery on the fifth metatarsal head in June however his blood sugar was apparently too high for anesthesia. Apparently the area was debrided and opened again in June and it is never closed since. Looking over the records from podiatry I am really not able to follow this. It was clear when he was first seen it was  before 5/14 at that point he already had a wound. By 5/17 the ulcer was resolved. I do not see anything about a procedure. On 5/28 noted to have pre-ulcerative moderate keratosis. X-ray noted 1/5 contracted toe and tailor's bunion and metatarsal deformity. On a visit date on 09/28/2018 the dorsal part of the left foot it healed and resolved. There was concern about swelling in his lower extremity he was sent to the ER.. As far as I can tell he was seen in the ER on 7/12 with an ulcer on his left foot. A DVT rule out of the left leg was negative. I do not think I have complete records from podiatry but I am not able to verify the procedures this patient states he had. He states after the last procedure the wound has never closed although I am not able to follow this in the records I have from podiatry. He has not had a recent x-ray The patient has been using Neosporin on the wound. He is wearing a Darco shoe. He is still very active up on his foot working and exercising. Past medical history; type 2 diabetes ketosis-prone, leg swelling with a negative DVT study in July. Non-smoker ABI in our clinic was 0.85 on the left 10/16; substantial wound on the plantar left fifth met head extending laterally almost to the dorsal fifth MTP. We have been using silver alginate we gave him a Darco forefoot off loader. An x-ray did not show evidence of osteomyelitis did note soft tissue emphysema which I think was due to gas tracking through an open wound. There is no doubt in my mind he requires an MRI 10/23; MRI not booked until 3  November at the earliest this is largely due to his glucose sensor in the right arm. We have been using silver alginate. There has been an improvement 10/29; I am still not exactly sure when his MRI is booked for. He says it is the third but it is the 10th in epic. This definitely needs to be done. He is running a low-grade fever today but no other symptoms. No real improvement in the 1 02/26/2019 patient presents today for a follow-up visit here in our clinic he is last been seen in the clinic on October 29. Subsequently we were working on getting MRI to evaluate and see what exactly was going on and where we would need to go from the standpoint of whether or not he had osteomyelitis and again what treatments were going be required. Subsequently the patient ended up being admitted to the hospital on 02/07/2019 and was discharged on 02/14/2019. This is a somewhat interesting admission with a discharge diagnosis of pneumonia due to COVID-19 although he was positive for COVID-19 when tested at the urgent care but negative x2 when he was actually in the hospital. With that being said he did have acute respiratory failure with hypoxia and it was noted he also have a left foot ulceration with osteomyelitis. With that being said he did require oxygen for his pneumonia and I level 4 L. He was placed on antivirals and steroids for the COVID-19. He was also transferred to the [facility] at one point. Nonetheless he did subsequently discharged home and since being home has done much better in that regard. The CT angiogram did not show any pulmonary embolism. With regard to the osteomyelitis the patient was placed on vancomycin and Zosyn while in the hospital but has been changed to Augmentin at discharge. It  was also recommended that he follow- up with wound care and podiatry. Podiatry however wanted him to see [name] according to the patient prior to them doing anything further. His hemoglobin A1c was 9.9  as noted in the hospital. Have an MRI of the left foot performed while in the hospital on 02/04/2019. 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 There is an overlying plantar open wound noted an abscess tracking back along the lateral aspect of the fifth metatarsal shaft. There is otherwise diffuse cellulitis and mild fasciitis without findings of polymyositis. The patient did have recently pneumonia secondary to COVID-19 I looked in the chart through epic and it does appear that the patient may need to have an additional x-ray just to ensure everything is cleared and that he has no airspace disease prior to putting him into the [name]. 03/05/2019; patient was readmitted to the clinic last week. He was hospitalized twice for a viral upper respiratory tract infection from 11/1 through 11/4 and then 11/5 through 11/12 ultimately this turned out to be Covid pneumonitis. Although he was discharged on oxygen he is not using it. He says he feels fine. He has no exercise limitation no cough no sputum. His O2 sat in our clinic today was 100% on room air. 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 He received Vanco and Zosyn in the hospital and then was discharged on 2 weeks of Augmentin. I do not see any relevant cultures. He was supposed to [place], [name] ([id number]) [id number] Page 3 of 21 follow-up with infectious disease but I do not see that he has an appointment. 12/8; patient saw Dr. Novella Olive of infectious disease last week. He felt that he had had adequate antibiotic therapy. He did not go to follow-up with Dr. Amalia Hailey of podiatry and I have again talked to him about the pros and cons of this. He does not want to consider a ray amputation of this time. He is aware of  the risks of recurrence, migration etc. He started HBO today and tolerated this well. He can complete the Augmentin that I gave him last week. I have looked over the lab work that Dr. Chana Bode ordered his C-reactive protein was 3.3 and his sedimentation rate was 17. The C-reactive protein is never really been measurably that high in this patient 12/15; not much change in the wound today however he has undermining along the lateral part of the foot again more extensively than last week. He has some rims of epithelialization. We have been using silver alginate. He is undergoing hyperbarics but did not dive today 12/18; in for his obligatory first total contact cast change. Unfortunately there was pus coming from the undermining area around his fifth metatarsal head. This was cultured but will preclude reapplication of a cast. He is seen in conjunction with HBO 12/24; patient had staph lugdunensis in the wound in the undermining area laterally last time. We put him on doxycycline which should have covered this. The wound looks better today. I am going to give him another week of doxycycline before reattempting the total contact cast 12/31; the patient is completing antibiotics. Hemorrhagic debris in the distal part of the wound with some undermining distally. He also had hyper granulation. Extensive debridement with a #5 curette. The infected area that was on the lateral  part of the fifth met head is closed over. I do not think he needs any more antibiotics. Patient was seen prior to HBO. Preparations for a total contact cast were made in the cast will be placed post hyperbarics 04/11/19; once again the patient arrives today without complaint. He had been in a cast all week noted that he had heavy drainage this week. This resulted in large raised areas of macerated tissue around the wound 1/14; wound bed looks better slightly smaller. Hydrofera Blue has been changing himself. He had a heavy drainage last  week which caused a lot of maceration around the wound so I took him out of a total contact cast he says the drainage is actually better this week He is seen today in conjunction with HBO 1/21; returns to clinic. He was up in [place] for a day or 2 attending a funeral. He comes back in with the wound larger and with a large area of exposed bone. He had osteomyelitis and septic arthritis of the fifth left metatarsal head while he was in hospital. He received IV antibiotics in the hospital for a prolonged period of time then 3 weeks of Augmentin. Subsequently I gave him 2 weeks of doxycycline for more superficial wound infection. When I saw this last week the wound was smaller the surface of the wound looks satisfactory. 1/28; patient missed hyperbarics today. Bone biopsy I did last time showed Enterococcus faecalis and Staphylococcus lugdunensis . He has a wide area of exposed bone. We are going to use silver alginate as of today. I had another ethical discussion with the patient. 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 In this situation I think the likelihood of healing this is low. Therefore I have recommended a ray amputation and with the patient's agreement I have referred him to Dr. Doran Durand. The other issue is that his compliance with hyperbarics has been minimal because of his work schedule and given his underlying decision I am going to stop this today READMISSION 10/24/2019 MRI 09/29/2019 left foot IMPRESSION: 1. Apparent skin ulceration inferior and lateral to the 5th metatarsal base with underlying heterogeneous T2 signal and enhancement in the subcutaneous fat. Small peripherally enhancing fluid collections along the plantar and lateral aspects of the 5th metatarsal base suspicious for abscesses. 2. Interval amputation through the mid 5th metatarsal with nonspecific low-level marrow edema and enhancement. Given the proximity to the adjacent soft  tissue inflammatory changes, osteomyelitis cannot be excluded. 3. The additional bones appear unremarkable. MRI 09/29/2019 right foot IMPRESSION: 1. Soft tissue ulceration lateral to the 5th MTP joint. There is low-level T2 hyperintensity within the 4th and 5th metatarsal heads and adjacent proximal phalanges without abnormal T1 signal or cortical destruction. These findings are nonspecific and could be seen with early marrow edema, hyperemia or early osteomyelitis. No evidence of septic joint. 2. Mild tenosynovitis and synovial enhancement associated with the extensor digitorum tendons at the level of the midfoot. 3. Diffuse low-level muscular T2 hyperintensity and enhancement, most consistent with diabetic myopathy. LEFT FOOT BONE Methicillin resistant staphylococcus aureus Staphylococcus lugdunensis MIC MIC CIPROFLOXACIN >=8 RESISTANT Resistant <=0.5 SENSI... Sensitive CLINDAMYCIN <=0.25 SENS... Sensitive >=8 RESISTANT Resistant ERYTHROMYCIN >=8 RESISTANT Resistant >=8 RESISTANT Resistant GENTAMICIN <=0.5 SENSI... Sensitive <=0.5 SENSI... Sensitive Inducible Clindamycin NEGATIVE Sensitive NEGATIVE Sensitive OXACILLIN >=4 RESISTANT Resistant 2 SENSITIVE Sensitive RIFAMPIN <=0.5 SENSI... Sensitive <=0.5 SENSI... Sensitive TETRACYCLINE <=1 SENSITIVE Sensitive <=1 SENSITIVE Sensitive TRIMETH/SULFA <=10 SENSIT Sensitive <=10 SENSIT Sensitive ... Marland Kitchen.. VANCOMYCIN 1 SENSITIVE  Sensitive <=0.5 SENSI... Sensitive Right foot bone . Component 3 wk ago Specimen Description BONE Special Requests RIGHT 4 METATARSAL SAMPLE B Gram Stain NO WBC SEEN NO ORGANISMS SEEN Culture RARE METHICILLIN RESISTANT STAPHYLOCOCCUS AUREUS NO ANAEROBES ISOLATED Performed at [facility], [place] [address] [place], [place] [address] Report Status 10/08/2019 FINAL Organism ID, Bacteria METHICILLIN RESISTANT STAPHYLOCOCCUS AUREUS [name], [name] ([id number]) [id number] Page 4 of  21 Resulting Agency [facility] Susceptibility Methicillin resistant staphylococcus aureus MIC CIPROFLOXACIN >=8 RESISTANT Resistant CLINDAMYCIN <=0.25 SENS... Sensitive ERYTHROMYCIN >=8 RESISTANT Resistant GENTAMICIN <=0.5 SENSI... Sensitive Inducible Clindamycin NEGATIVE Sensitive OXACILLIN >=4 RESISTANT Resistant RIFAMPIN <=0.5 SENSI... Sensitive TETRACYCLINE <=1 SENSITIVE Sensitive TRIMETH/SULFA <=10 SENSIT Sensitive ... VANCOMYCIN 1 SENSITIVE Sensitive This is a patient we had in clinic earlier this year with a wound over his left fifth metatarsal head. 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 Doran Durand on 05/16/2019. He seems to have developed problems with wounds on his bilateral feet in June 2021 although he may have had problems earlier than this. He was in an urgent care with a right foot ulcer on 09/26/2019 and given a course of doxycycline. This was apparently after having trouble getting into see orthopedics. He was seen by podiatry on 09/28/2019 noted to have bilateral lower extremity ulcers including the left lateral fifth metatarsal base and the right subfifth met head. It was noted that had purulent drainage at that time. He required hospitalization from 6/20 through 7/2. This was because of worsening right foot wounds. He underwent bilateral operative incision and drainage and bone biopsies bilaterally. Culture results are listed above. He has been referred back to clinic by Dr. Jacqualyn Posey of podiatry. He is also followed by Dr. Megan Salon who saw him yesterday. He was discharged from hospital on Zyvox Flagyl and Levaquin and yesterday changed to doxycycline Flagyl and Levaquin. His inflammatory markers  on 6/26 showed a sedimentation rate of 129 and a C-reactive protein of 5. This is improved to 14 and 1.3 respectively. This would indicate improvement. ABIs in our clinic today were 1.23 on the right and 1.20 on the left 11/01/2019 on evaluation today patient appears to be doing fairly well in regard to the wounds on his feet at this point. Fortunately there is no signs of active infection at this time. No fevers, chills, nausea, vomiting, or diarrhea. He currently is seeing infectious disease and still under their care at this point. Subsequently he also has both wounds which she has not been using collagen on as he did not receive that in his packaging he did not call us and let us know that. Apparently that just was missed on the order. Nonetheless we will get that straightened out today. 8/9-Patient returns for bilateral foot wounds, using Prisma with hydrogel moistened dressings, and the wounds appear stable. Patient using surgical shoes, avoiding much pressure or weightbearing as much as possible 8/16; patient has bilateral foot wounds. 1 on the right lateral foot proximally the other is on the left mid lateral foot. Both required debridement of callus and thick skin around the wounds. We have been using silver collagen 8/27; patient has bilateral lateral foot wounds. The area on the left substantially surrounded by callus and dry skin. This was removed from  the wound edge. The underlying wound is small. The area on the right measured somewhat smaller today. 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 Unfortunately I did not update his antibiotics during today's visit. 9/10 I reviewed Dr. Hale Bogus last notes he felt he had completed antibiotics his inflammatory markers were reasonably well controlled. He has a small wound on the lateral left foot and a tiny area on the right which is just above closed. He is using Hydrofera Blue  with border foam he has bilateral surgical shoes 9/24; 2 week f/u. doing well. right foot is closed. left foot still undermined. 10/14; right foot remains closed at the fifth met head. The area over the base of the left fifth metatarsal has a small open area but considerable undermining towards the plantar foot. Thick callus skin around this suggests an adequate pressure relief. We have talked about this. He says he is going to go back into his cam boot. I suggested a total contact cast he did not seem enamored with this suggestion 10/26; left foot base of the fifth metatarsal. Same condition as last time. He has skin over the area with an open wound however the skin is not adherent. He went to see Dr. Earleen Newport who did an x-ray and culture of his foot I have not reviewed the x-ray but the patient was not told anything. He is on doxycycline 11/11; since the patient was last here he was in the emergency room on 10/30 he was concerned about swelling in the left foot. They did not do any cultures or x-rays. They changed his antibiotics to cephalexin. Previous culture showed group B strep. The cephalexin is appropriate as doxycycline has less than predictable coverage. Arrives in clinic today with swelling over this area under the wound. He also has a new wound on the right fifth metatarsal head 11/18; the patient has a difficult wound on the lateral aspect of the left fifth metatarsal head. The wound was almost ballotable last week I opened it slightly expecting to see purulence however there was just bleeding. I cultured this this was negative. X-ray unchanged. We are trying to get an MRI but I am not sure were going to be able to get this through his insurance. He also has an area on the right lateral fifth metatarsal head this looks healthier 12/3; the patient finally got our MRI. Surprisingly this did not show osteomyelitis. I did show the soft tissue ulceration at the lateral plantar aspect of the  fifth metatarsal base with a tiny residual 6 mm abscess overlying the superficial fascia I have tried to culture this area I have not been able to get this to grow anything. Nevertheless the protruding tissue looks aggravated. I suspect we should try to treat the underlying "abscess with broad-spectrum antibiotics. I am going to start him on Levaquin and Flagyl. He has much less edema in his legs and I am going to continue to wrap his legs and see him weekly 12/10. I started Levaquin and Flagyl on him last week. He just picked up the Flagyl apparently there was some delay. The worry is the wound on the left fifth metatarsal base which is substantial and worsening. His foot looks like he inverts at the ankle making this a weightbearing surface. Certainly no improvement in fact I think the measurements of this are somewhat worse. We have been using 12/17; he apparently just got the Levaquin yesterday this is  2 weeks after the fact. He has completed the Flagyl. The area over the left fifth metatarsal base still has protruding granulation tissue although it does not look quite as bad as it did some weeks ago. He has severe bilateral lymphedema although we have not been treating him for wounds on his legs this is definitely going to require compression. There was so much edema in the left I did not wish to put him in a total contact cast today. I am going to increase his compression from 3-4 layer. The area on the right lateral fifth met head actually look quite good and superficial. 12/23; patient arrived with callus on the right fifth met head and the substantial hyper granulated callused wound on the base of his fifth metatarsal. He says he is completing his Levaquin in 2 days but I do not think that adds up with what I gave him but I will have to double check this. We are using Hydrofera Blue on both areas. My plan is to put the left leg in a cast the week after New Year's 04/06/2020; patient's wounds  about the same. Right lateral fifth metatarsal head and left lateral foot over the base of the fifth metatarsal. There is undermining on the left lateral foot which I removed before application of total contact cast continuing with Hydrofera Blue new. Patient tells me he was seen by endocrinology today lab work was done [Dr. Kerr]. Also wondering whether he was referred to cardiology. I went over some lab work from previously does not have chronic renal failure certainly not nephrotic range proteinuria he does have very poorly controlled diabetes but this is not his most updated lab work. Hemoglobin A1c has been over 11 1/10; the patient had a considerable amount of leakage towards mid part of his left foot with macerated skin however the wound surface looks better the area on the right lateral fifth met head is better as well. I am going to change the dressing on the left foot under the total contact cast to silver alginate, continue with Hydrofera Blue on the right. 1/20; patient was in the total contact cast for 10 days. Considerable amount of drainage although the skin around the wound does not look too bad on the left foot. The area on the right fifth metatarsal head is closed. Our nursing staff reports large amount of drainage out of the left lateral foot wound 1/25; continues with copious amounts of drainage described by our intake staff. PCR culture I did last week showed E. coli and Enterococcus faecalis and low quantities. Multiple resistance genes documented including extended spectrum beta lactamase, MRSA, MRSE, quinolone, tetracycline. The wound is not quite as good this week as it was 5 days ago but about the same size 2/3; continues with copious amounts of malodorous drainage per our intake nurse. The PCR culture I did 2 weeks ago showed E. coli and low quantities of Enterococcus. There were multiple resistance genes detected. I put Neosporin on him last week although this does not seem  to have helped. The wound is ZENON, [name] ([id number]) [id number] Page 5 of 21 slightly deeper today. Offloading continues to be an issue here although with the amount of drainage she has a total contact cast is just not going to work 2/10; moderate amount of drainage. Patient reports he cannot get his stocking on over the dressing. I told him we have to do that the nurse gave him suggestions on how to make this work. The  wound is on the bottom and lateral part of his left foot. Is cultured predominantly grew low amounts of Enterococcus, E. coli and anaerobes. There were multiple resistance genes detected including extended spectrum beta lactamase, quinolone, tetracycline. I could not think of an easy oral combination to address this so for now I am going to do topical antibiotics provided by [facility] I think the main agents here are vancomycin and an aminoglycoside. We have to be able to give him access to the wounds to get the topical antibiotic on 2/17; moderate amount of drainage this is unchanged. He has his Keystone topical antibiotic against the deep tissue culture organisms. He has been using this and changing the dressing daily. Silver alginate on the wound surface. 2/24; using Keystone antibiotic with silver alginate on the top. He had too much drainage for a total contact cast at one point although I think that is improving and I think in the next week or 2 it might be possible to replace a total contact cast I did not do this today. In general the wound surface looks healthy however he continues to have thick rims of skin and subcutaneous tissue around the wide area of the circumference which I debrided 06/04/2020 upon evaluation today patient appears to be doing well in regard to his wound. I do feel like he is showing signs of improvement. There is little bit of callus and dead tissue around the edges of the wound as well as what appears to be a little bit of  a sinus tract that is off to the side laterally I would perform debridement to clear that away today. 3/17; left lateral foot. The wound looks about the same as I remember. Not much depth surface looks healthy. No evidence of infection 3/25; left lateral foot. Wound surface looks about the same. Separating epithelium from the circumference. There really is no evidence of infection here however not making progress by my view 3/29; left lateral foot. Surface of the wound again looks reasonably healthy still thick skin and subcutaneous tissue around the wound margins. There is no evidence of infection. One of the concerns being brought up by the nurses has again the amount of drainage vis--vis continued use of a total contact cast 4/5; left lateral foot at roughly the base of the fifth metatarsal. Nice healthy looking granulated tissue with rims of epithelialization. The overall wound measurements are not any better but the tissue looks healthy. The only concern is the amount of drainage although he has no surrounding maceration with what we have been doing recently to absorb fluid and protect his skin. He also has lymphedema. 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 He states that the kerlix/coban under the TCC rolled down and could not get it back up. He has been using an offloading scooter and has somehow hurt his right foot using this device. This happened last week. He states that the side of his right foot developed a blister and opened. The top of his foot also has a few small open wounds he thinks is due to his socks rubbing in his shoes. He has not been using any dressings to the wound. He denies purulent drainage, fever/chills or erythema to the  wounds. 4/22; patient presents for 1 week follow-up. He developed new wounds to the right foot that were evaluated at last clinic visit. He continues to have a total contact cast to the left leg and he reports no issues. He has been using silver collagen to the right foot wounds with no issues. He denies purulent drainage, fever/chills or erythema to the right foot wounds. He has no complaints today 4/25; patient presents for 1 week follow-up. He has a total contact cast of the left leg and reports no issues. He has been using silver alginate to the right foot wound. He denies purulent drainage, fever/chills or erythema to the right foot wounds. 5/2 patient presents for 1 week follow-up. T contact cast on the left. The wound which is on the base of the plantar foot at the base of the fifth metatarsal otal actually looks quite good and dimensions continue to gradually contract. HOWEVER the area on the right lateral fifth metatarsal head is much larger than what I remember from 2 weeks ago. Once more is he has significant levels of hypergranulation. Noteworthy that he had this same hyper granulated response on his wound on the left foot at one point in time. So much so that he I thought there was an underlying fluid collection. Based on this I think this just needs debridement. 5/9; the wound on the left actually continues to be gradually smaller with a healthy surface. Slight amount of drainage and maceration of the skin around but not too bad. However he has a large wound over the right fifth metatarsal head very much in the same configuration as his left foot wound was initially. I used silver nitrate to address the hyper granulated tissue no mechanical debridement 5/16; area on the left foot did not look as healthy this week deeper thick surrounding macerated skin and subcutaneous tissue. The area on the right foot fifth met head was about the same The area on the right ankle that we identified  last week is completely broken down into an open wound presumably a stocking rubbing issue 5/23; patient has been using a total contact cast to the left side. He has been using silver alginate underneath. He has also been using silver alginate to the right foot wounds. He has no complaints today. He denies any signs of infection. 5/31; the left-sided wound looks some better measure smaller surface granulation looks better. We have been using silver alginate under the total contact cast The large area on his right fifth met head and right dorsal foot look about the same still using silver alginate 6/6; neither side is good as I was hoping although the surface area dimensions are better. A lot of maceration on his left and right foot around the wound edge. Area on the dorsal right foot looks better. He says he was traveling. I am not sure what does the amount of maceration around the plantar wounds may be drainage issues 6/13; in general the wound surfaces look quite good on both sides. Macerated skin and raised edges around the wound required debridement although in general especially on the left the surface area seems improved. The area on the right dorsal ankle is about the same I thought this would not be such a problem to close 6/20; not much change in either wound although the one on the right looks a little better. Both wounds have thick macerated edges to the skin requiring debridements. We have been using silver alginate. The area on the  dorsal right ankle is still open I thought this would be closed. 6/28; patient comes in today with a marked deterioration in the right foot wound fifth met head. Wide area of exposed bone this is a drastic change from last time. The area on the left there we have been casting is stagnant. We have been using silver alginate in both wound areas. 7/5; bone culture I did for PCR last time was positive for Pseudomonas, group B strep, Enterococcus and Staph aureus.  There was no suggestion of methicillin resistance or ampicillin resistant genes. This was resistant to tetracycline however He comes into the clinic today with the area over his right plantar fifth metatarsal head which had been doing so well 2 weeks ago completely necrotic feeling bone. I do not know that this is going to be salvageable. The left foot wound is certainly no smaller but it has a better surface and is superficial. 7/8; patient called in this morning to say that his total contact cast was rubbing against his foot. He states he is doing fine overall. He denies signs of infection. 7/12; continued deterioration in the wound over the right fifth metatarsal head crumbling bone. This is not going to be salvageable. The patient agrees and wants to be referred to Dr. Doran Durand which we will attempt to arrange as soon as possible. I am going to continue him on antibiotics as long as that takes so I will renew those today. The area on the left foot which is the base of the fifth metatarsal continues to look somewhat better. Healthy looking tissue no depth no debridement is necessary here. 7/20; the patient was kindly seen by Dr. Doran Durand of orthopedics on 10/19/2020. 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 Towards this end we have taken him out of the total contact cast on the left we will put him in a wrap with Hydrofera Blue. As I understand things surgery is planned for 7/21 7/27; patient had his surgery last Thursday. 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 Apparently everything went well we did not still disturb that today The area on the left foot actually looks quite good. He has been much less mobile which probably explains this he did not seem to do well in the total contact cast secondary to drainage and maceration I think. We have been using [place], [name] ([id number])  [id number] Page 6 of 21 11/09/2020 upon evaluation today patient appears to be doing well with regard to his plantar foot ulcer on the left foot. Fortunately there is no evidence of active infection at this time. No fevers, chills, nausea, vomiting, or diarrhea. Overall I think that he is actually doing extremely well. Nonetheless I do believe that he is staying off of this more following the surgery in his right foot that is the reason the left is doing so great. 8/16; left plantar foot wound. This looks smaller than the last time I saw this he is using Hydrofera Blue. The surgical wound on the right foot is being followed by Dr. Doran Durand we did not look at this today. He has surgical shoes on both feet 8/23; left plantar foot wound not as good this week. Surrounding macerated skin and subcutaneous tissue everything looks moist and wet. I do not think he is offloading this adequately. He is using a surgical shoe Apparently the right foot surgical  wound is not open although I did not check his foot 8/31; left plantar foot lateral aspect. Much improved this week. He has no maceration. Some improvement in the surface area of the wound but most impressively the depth is come in we are using silver alginate. The patient is a Product/process development scientist. He is asked that we write him a letter so he can go back to work. I have also tried to see if we can write something that will allow him to limit the amount of time that he is on his foot at work. 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 Hopefully the latter can be avoided 9/6; I believe the patient missed an appointment last week. He arrives in today with a wound looking roughly the same certainly no better. Undermining laterally and also inferiorly. We used molecuLight today in training with the patient's permission.. We are using silver alginate 9/21  wound is measuring bigger this week although this may have to do with the aggressive circumferential debridement last week in response to the blush fluorescence on the MolecuLight. Culture I did last week showed significant MSSA and E. coli. I put him on Augmentin but he has not started it yet. We are also going to send this for compounded antibiotics at [facility]. There is no evidence of systemic infection 9/29; silver alginate. His Keystone arrived. He is completing Augmentin in 2 days. Offloading in a cam boot. Moderate drainage per our intake staff 10/5; using silver alginate. He has been using his Brenda. He has completed his Augmentin. Per our intake nurse still a lot of drainage, far too much to consider a total contact cast. Wound measures about the same. He had the same undermining area that I defined last week from a roughly 11-3. I remove this today 10/12; using silver alginate he is using the [place]. He comes in for a nurse visit hence we are applying [employer] twice a week. Measuring slightly better today and less notable drainage. Extensive debridement of the wound edge last time 10/18; using topical Keystone and silver alginate and a soft cast. Wound measurements about the same. Drainage was through his soft cast. We are changing this twice a week Tuesdays and Friday 10/25; comes in with moderate drainage. Still using Keystone silver alginate and a soft cast. Wound dimensions completely the same.He has a lot of edema in the left leg he has lymphedema. Asking for [name] to consider wrapping him as he cannot get his stocking on over the soft cast 11/2; comes in with moderate to large drainage slightly smaller in terms of width we have been using [place]. His wound looks satisfactory but not much improvement 11/4; patient presents today for obligatory cast change. Has no issues or complaints today. He denies signs of infection. 11/9; patient traveled this weekend to [place], was on the cast  quite a bit. Staining of the cast with black material from his walking boot. Drainage was not quite as bad as we feared. Using silver alginate and Keystone 11/16; we do not have size for cast therefore we have been putting a soft cast on him since the change on Friday. Still a significant amount of drainage necessitating changing twice a week. We have been using the Keystone at cast changes either hard or soft as well as silver alginate Comes in the clinic with things actually looking fairly good improvement in width. He says  his offloading is about the same 02/24/2021 upon evaluation today patient actually comes back in and is doing excellent in regard to his foot ulcer this is significantly smaller even compared to the last visit. The soft cast seems to have done extremely well for him which is great news. I do not see any signs of infection minimal debridement will be needed today. 11/30; left lateral foot much improved half a centimeter improvement in surface area. No evidence of infection. He seems to be doing better with the soft cast in the TCC therefore we will continue with this. He comes back in later in the week for a change with the nurses. This is due to drainage 12/6; no improvement in dimensions. Under illumination some debris on the surface we have been using silver alginate, soft cast. If there is anything optimistic here he seems to have have less drainage 12/13. Dimensions are improved both length and width and slightly in depth. Appears to be quite healthy today. Raised edges of this thick skin and callus around the edges however. He is in a soft cast were bringing him back once for a change on Friday. Drainage is better 12/20. Dimensions are improved. He still has raised edges of thick skin and callus around the edges. We are using a soft cast 12/28; comes in today with thick callus around the wound. Using silver under alginate under a soft cast. I do not think there is much  improvement in any measurement 2023 04/06/2021; patient was put in a total contact cast. Unfortunately not much change in surface area 1/10; not much different still thick callus and skin around the edge in spite of the total contact cast. This was just debrided last week we have been using the [facility] compounded antibiotic and silver alginate under a total contact cast 1/18 the patient's wound on the left side is doing nicely. smaller HOWEVER he comes in today with a wound on the right foot laterally. blister most likely serosangquenous drainage 1/24; the patient continues to do well in terms of the plantar left foot which is continued to contract using silver alginate under the total contact cast HOWEVER the right lateral foot is bigger with denuded skin around the edges. I used pickups and a #15 scalpel to remove this this looks like the remanence of a large blister. Cannot rule out infection. Culture in this area I did last week showed Staphylococcus lugdunensis few colonies. I am going to try to address this with his [employer] antibiotic that is done so well on the left having linezolid and this should cover the staph 2/1; the patient's wound on his left foot which was the original plantar foot wound thick skin and eschar around the edges even in the total contact cast but the wound surface does not look too bad The real problem is on how his right lateral foot at roughly the base of the fifth metatarsal. The wound is completely necrotic more worrisome than that there is swelling around the edges of this. We have been using silver alginate on both wounds and Keystone on the right foot. Unfortunately I think he is going to require systemic antibiotics while we await cultures. He did not get the x-ray done that we ordered last week [lost the prescription 2/7; disappointingly in the area on the left foot which we are treating with a total contact cast is still not closed although it is much smaller.  He continues to have a lot of callus around the  wound edge. -Right lateral foot culture I did last week was negative x-ray also negative for osteomyelitis. 2/15: TCC silver alginate on the left and silver alginate on the right lateral. No real improvement in either area 05/26/2021: T oday, the wounds are roughly the same size as at his previous visit, post-debridement. He continues to endorse fairly substantial drainage, particularly on the right. He has been in a total contact cast on the left. There is still some callus surrounding this lesion. On the right, the periwound skin is quite macerated, along with surrounding callus. The center of the right-sided wound also has some dark, densely adherent material, which is very difficult to remove. 06/02/2021: Today, both wounds are slightly smaller. He has been using zinc oxide ointment around the right ulcer and the degree of maceration has improved markedly. There continues to be an area of nonviable tissue in the center of the right sided ulcer. The left-sided wound, which has been in the total contact [place], [name] ([id number]) [id number] Page 7 of 21 cast. Appears clean and the degree of callus around it is less than previously. 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 He had to take the stairs and both wounds have increased in size. The left foot, which has been in a total contact cast, has developed a tunnel tracking to the lateral aspect of his foot. The nonviable tissue in the center of the right-sided ulcer remains recalcitrant to debridement. There is significant undermining surrounding the entirety of the left sided wound. 06/16/2021: The elevator at school has been fixed and the patient has been able to avoid putting as much weight on his wounds over the past week. We converted the left foot wound into a single lesion today, but despite this, the  wound is actually smaller. The base is healthy with limited periwound callus. On the right, the central necrotic area is still present. He continues to be quite macerated around the right sided wound, despite applying barrier cream. This does, however, have the benefit of softening the callus to make it more easily removable. 06/23/2021: Today, the left wound is smaller. The lateral aspect that had opened up previously is now closed. The wound base has a healthy bed of granulation tissue and minimal slough. Unfortunately, on the right, the wound is larger and continues to be fairly macerated. He has also reopened the wound at his right ankle. He thinks this is due to the gait he has adopted secondary to his total contact cast and boot. 06/30/2021: T oday, both wounds are a little bit larger. The lateral aspect on the left has remained closed. He continues to have significant periwound maceration. The culture that I took from the right sided wound grew a population of bacteria that is not covered by his current [facility] antibiotic. The center of the right- sided wound continues to appear necrotic with nonviable fat. It probes deeper today, but does not reach bone. 07/07/2021: The periwound maceration is a little bit less today. The right lateral foot wound has some areas that appear more viable and the necrotic center also looks a little bit better. The wound on the dorsal surface of his right foot near the ankle is contracting and the surface appears healthy. The left plantar wound surface looks healthy, but there is some new undermining on the medial portion. He did get his new Keystone antibiotic and began applying that to the right foot wound on Saturday. 07/14/2021:  The intake nurse reported substantial drainage from his wounds, but the periwound skin actually looks better than is typical for him. The wound on the dorsal surface of his right foot near the ankle is smaller and just has a small open area  underneath some dried eschar. The left plantar wound surface looks healthy and there has been no significant accumulation of callus. The right lateral foot wound looks quite a bit better, with the central portion, which typically appears necrotic, looking more viable albeit pale. 07/22/2021: His left foot is extremely macerated today. The wound is about the same size. The wound on the dorsal surface of his right foot near the ankle had closed, but he traumatized it removing the dressing and there is a tiny skin tear in that location. The right lateral foot wound is bigger, but the surface appears healthy. 07/30/2021: The wound on the dorsal surface of his right foot near the ankle is closed. The right lateral foot wound again is a little bit bigger due to some undermining. The periwound skin is in better condition, however. He has been applying zinc oxide. The wound surface is a little bit dry today. On the left, he does not have the substantial maceration that we frequently see. The wound itself is smaller and has a clean surface. 08/06/2021: Both wounds seem to have deteriorated over the past week. The right lateral foot wound has a dry surface but the periwound is boggy.. Overall wound dimensions are about the same. On the left, the wound is about the same size, but there is more undermining present underneath periwound callus. 08/13/2021: The right sided wound looks about the same, but on the left there has been substantial deterioration. The undermining continues to extend under periwound callus. Once this was removed, substantial extension of the wound was present. There is no odor or purulent drainage but clearly the wounds have broken down. 08/20/2021: The wounds look about the same today. He has been out of his total contact cast and has just been changing the dressings using topical Keystone with PolyMem Ag, Kerlix and Ace bandages. The wound on the top of his right ankle has reopened but this is  quite small. There was a little bit of purulent material that I expressed when examining this wound. 08/24/2021: After the aggressive debridement I performed at his last visit, the wounds actually look a little bit better today. They are smaller with the exception of the wound on the top of his right ankle which is a little bit bigger as some more skin pulled off when he was changing his dressing. We are using topical Keystone with PolyMem Ag Kerlix and Ace bandages. 09/02/2021: There has been really no change to any of his wounds. 09/16/2021: The patient was hospitalized last week with nausea, vomiting, and dehydration. He says that while he was in the hospital, his wounds were not really addressed properly. T oday, both plantar foot wounds are larger and the periwound skin is macerated. The wound on the dorsum of his right foot has a scab on the top. The right foot now has a crater where previously he had had nonviable fat. It looks as though this simply died and fell out. The periwound callus is wet. 09/24/2021: His wounds have deteriorated somewhat since his last visit. The wound on the dorsum of his right foot near his ankle is larger and has more nonviable tissue present. The crater in his right foot is even deeper; I cannot quite palpate or  probe to bone but I am sure it is close. The wound on his left plantar foot has an odd boggy area in the center that almost feels as though it has fluid within it. He has run out of his topical Keystone antibiotic. We are using silver alginate on his wounds. 09/29/2021: He has developed a new wound on the dorsum of his left foot near his ankle. He says he thinks his wrapping is rubbing in that site. I would concur with this as the wound on his right ankle is larger. The left foot looks about the same. The right foot has the crater that was present last week. No significant slough accumulation, but his foot remains quite swollen and warm despite oral antibiotic  therapy. 10/08/2021: All of his wounds look about the same as last week. He did not start his oral antibiotics that are prescribed until just a couple of days ago; his [employer] compounded antibiotics formula has been changed and he is awaiting delivery of the new recipe. His MRI that was scheduled for earlier this week was canceled as no prior authorization had been obtained; unfortunately the tech responsible sent an email to my old [employer] email, which I no longer use nor have access to. 10/18/2021: The wounds on his bilateral dorsal feet near the ankles are both improved. They are smaller and have just some eschar and slough buildup. The left plantar wound has a fair amount of undermining, but the surface is clean. There is some periwound callus accumulation. On the right plantar foot, there is nonviable fat leading to a deep tunnel that tracks towards his dorsal medial foot. There is periwound callus and slough accumulation, as well. His right foot and leg remain swollen as compared to the left. 10/25/2021: The wounds on his bilateral dorsal feet and at the ankles have broken down somewhat. They are little bit larger than last week. The left plantar wound continues to undermine laterally but the surface is clean. The right plantar foot wound shows some decreased depth in the tunnel tracking towards his dorsal medial foot. He has not yet had the Doppler study that I ordered; it sounds like there is some confusion about the scheduling of the procedure. In addition, the MRI was denied and I have taken steps to appeal the denial. 11/24/2021: Since our last visit, Mr. [name] was admitted to the hospital where an MRI suggested osteomyelitis. He was taken the operating room by podiatry. Bone biopsies were negative for osteomyelitis. They debrided his wounds and applied myriad matrix. He saw them last week and they removed his staples. He is here today to continue his wound healing process. T oday, both  of the dorsal foot/ankle wounds are substantially smaller. There is just a little eschar overlying the left sided wound and some eschar and slough on the right. The right plantar foot ulcer has the healthiest surface of granulation tissue that I have seen to date. A portion of the myriad matrix failed to take and was hanging loose. It appears that myriad morcells were placed into the tunnel closest to the dorsal portion of his foot. These have sloughed off. The left plantar foot ulcer is about the same size, but has a much healthier surface than in the past. Both plantar ulcers have callus and slough accumulation. 12/07/2021: Left dorsal foot/ankle wound is closed. The right dorsal foot/ankle wound is nearly closed and just has a small open area with some eschar and slough. The right plantar foot wound  has contracted quite a bit since our last visit. It has a healthy surface with just a little bit of slough accumulation and [place], [name] ([id number]) [id number] Page 8 of 21 periwound callus buildup. The left plantar foot wound is about the same size but the surface appears healthy. There is a little slough and periwound callus on this side, as well. 12/15/2021: Both dorsal foot/ankle wounds are closed. The right plantar foot wound is substantially smaller than at our last visit. The tunneling that was present has nearly closed. There is just a little bit of slough buildup. The left plantar foot wound is also a little bit smaller today. The surface is the healthiest that I have ever seen it. Light slough and periwound callus accumulation on this side. 12/22/2021: The dorsal ankle/foot wounds remain closed. The right plantar foot wound continues to contract. There is still a bit of depth at the lateral portion of the wound but the surface has a good granulated appearance. The wound on the left is about the same size, the central indented portion is still adherent. It also is  clean with good granulation tissue present. The periwound skin and callus are a bit macerated, however. 12/29/2021: Both ankle wounds remain closed. The right plantar foot wound is less than half the size that it was last week. There is no depth any longer and the surface has just a little bit of slough and periwound callus. On the left, the wound is not much smaller, but the surface is healthier with good granulation tissue. There is also a little slough and periwound callus buildup. 01/05/2022: The right plantar foot wound continues to contract and is once again about half the size as it was last week. There is just a little bit of surface slough and periwound callus. On the left, the depth of the wound has decreased and the diameter has started to contract. It is also very clean with just a little slough and biofilm present. 01/12/2022: The right plantar foot wound is smaller again today. There is just some periwound callus and slough accumulation. On the left, there is a fair amount of undermining and the surface is a little boggy, but no other significant change. 01/19/2022: The right plantar wound continues to contract. There is minimal periwound callus accumulation and just a light layer of slough on the surface. On the left, the surface looks better, but there is fairly significant undermining near the 11:00 portion of the wound, aiming towards his great toe. The skin overlying this area is very healthy and has a good fat layer present. No malodor or purulent drainage. 01/26/2022: The right sided wound continues to contract and has just a light layer of slough on the surface. Minimal periwound callus. On the left, he still has substantial undermining and the tissue surface is not as robust as I would like to see. No malodor or purulent drainage, but he did say he had a lot of serous drainage over the weekend. 02/02/2022: The right foot wound is about a quarter of the size that it was at his last  visit. There is just a little slough on the surface and some periwound callus. On the left, the undermining persists and the tissue is still more pale, but he does not seem to have had as much drainage over the last weekend. We are waiting on a wound VAC. 02/09/2022: His right foot has healed. He received the wound VAC, but did not bring it to clinic with him today.  The lateral aspect of the left foot wound has come in a little, but he still has substantial undermining on the medial and distal portion of the wound. Still with macerated periwound callus. 02/16/2022: The wound VAC was applied last Friday. T oday, there has been tremendous improvement in the surface of the wound bed. The undermined portion of the wound has come in a bit. There is still some overhanging dead skin. Overall the wound looks much better. 02/23/2022: No significant change in the overall wound dimensions, but the wound surface continues to improve and appears healthier and more robust. There is some periwound callus accumulation and overhanging skin. No concern for infection. Electronic Signature(s) Signed: 02/23/2022 9:05:41 AM By: [name] MD FACS Entered By: [name] on 02/23/2022 09:05:41 -------------------------------------------------------------------------------- Physical Exam Details Patient Name: Date of Service: [name], [name] 02/23/2022 8:15 A M Medical Record Number: [id number] Patient Account Number: [id number] Date of Birth/Sex: Treating RN: 08-21-1985 (36 y.o. M) Primary Care Provider: [name] Other Clinician: Referring Provider: Treating Provider/Extender: [name] in Treatment: 121 Constitutional . Slightly tachycardic, asymptomatic.. . . No acute distress. Respiratory Normal work of breathing on room air. Notes 02/23/2022: No significant change in the overall wound dimensions, but the wound surface continues to improve and appears healthier  and more robust. There is some periwound callus accumulation and overhanging skin. No concern for infection. Electronic Signature(s) Signed: 02/23/2022 9:06:23 AM By: [name] MD FACS Entered By: [name] on 02/23/2022 09:06:22 [place], [name] ([id number]) [id number] Page 9 of 21 -------------------------------------------------------------------------------- Physician Orders Details Patient Name: Date of Service: [name] 02/23/2022 8:15 A M Medical Record Number: [id number] Patient Account Number: [id number] Date of Birth/Sex: Treating RN: [birth year] (36 y.o. 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 - Dr. [name] - Room 1 with [name] Return A Wed 11/29 @ 08:15 am Bathing/ Shower/ Hygiene May shower and wash wound with soap and water. Negative Presssure Wound Therapy Wound #3 Left,Lateral,Plantar Foot Wound Vac to wound continuously at 126m/hg pressure Black Foam Edema Control - Lymphedema / SCD / Other Bilateral Lower Extremities Elevate legs to the level of the heart or above for 30 minutes daily and/or when sitting, a frequency of: - throughout the day Avoid standing for long periods of time. Exercise regularly Compression stocking or Garment 20-30 mm/Hg pressure to: - to both legs daily Off-Loading Open toe surgical shoe to: - Both feet Additional Orders / Instructions Follow Nutritious Diet Non Wound Condition pply the following to  affected area as directed: - continue to apply foam donut or cushion to healed right plantar foot A Wound Treatment Wound #3 - Foot Wound Laterality: Plantar, Left, Lateral Cleanser: Soap and Water 3 x Per Week/30 Days Discharge Instructions: May shower and wash wound with dial antibacterial soap and water prior to dressing change. Prim Dressing: VAC ary 3 x Per Week/30 Days Compression Wrap: Kerlix Roll 4.5x3.1 (in/yd) (Generic) 3 x Per Week/30 Days Discharge Instructions: Apply Kerlix and Coban compression as directed. Electronic Signature(s) Signed: 02/23/2022 9:38:41 AM By: CFredirick MaudlinMD FACS Entered By:  [name] on 02/23/2022 09:06:38 [place], [name] ([id number]) [id number] Page 10 of 21 -------------------------------------------------------------------------------- Problem List Details Patient Name: Date of Service: [name] 02/23/2022 8:15 A M Medical Record Number: [id number] Patient Account Number: [id number] Date of Birth/Sex: Treating RN: Jun 01, 1985 (36 y.o. 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, right ankle and foot 10/24/2019 10/24/2019 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 08/10/2020 08/10/2020 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, left ankle and foot 10/24/2019  10/24/2019 L97.322 Non-pressure chronic ulcer of left ankle with fat layer exposed 09/29/2021 09/29/2021 B95.62 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 10/24/2019 10/24/2019 classified elsewhere L97.518 Non-pressure chronic ulcer of other part of right foot with other specified severity 10/24/2019 10/24/2019 Resolved Problems Electronic Signature(s) Signed: 02/23/2022 9:03:05 AM By: [name] MD FACS Entered By: [name] on 02/23/2022 09:03:05 -------------------------------------------------------------------------------- Progress Note Details Patient Name: Date of Service: [name], [name] 02/23/2022 8:15 A Tinnie Gens, [name] ([id number]) [id number] Page 11 of 21 Medical Record Number: [id number] Patient Account Number: [id number] Date of Birth/Sex: Treating RN: 1985-12-13 (36 y.o. 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 He comes in for review of a wound over the plantar fifth metatarsal head extending into the lateral part of the foot. He was followed for this previously by his podiatrist Dr. Cornelius Moras. As the patient tells his story he went to see podiatry first for a swelling he developed on the lateral part of his fifth metatarsal head in May. He states this was "open" by podiatry and the area closed. He was followed up in June  and it was again opened callus removed and it closed promptly. There were plans being made for surgery on the fifth metatarsal head in June however his blood sugar was apparently too high for anesthesia. Apparently the area was debrided and opened again in June and it is never closed since. Looking over the records from podiatry I am really not able to follow this. It was clear when he was first seen it was before 5/14 at that point he already had a wound. By 5/17 the ulcer was resolved. I do not see anything about a procedure. On 5/28 noted to have pre-ulcerative moderate keratosis. X-ray noted 1/5 contracted toe and tailor's bunion and metatarsal deformity. On a visit date on 09/28/2018 the dorsal part of the left foot it healed and resolved. There was concern about swelling in his lower extremity he was sent to the ER.. As far as I can tell he was  seen in the ER on 7/12 with an ulcer on his left foot. A DVT rule out of the left leg was negative. I do not think I have complete records from podiatry but I am not able to verify the procedures this patient states he had. He states after the last procedure the wound has never closed although I am not able to follow this in the records I have from podiatry. He has not had a recent x-ray The patient has been using Neosporin on the wound. He is wearing a Darco shoe. He is still very active up on his foot working and exercising. Past medical history; type 2 diabetes ketosis-prone, leg swelling with a negative DVT study in July. Non-smoker ABI in our clinic was 0.85 on the left 10/16; substantial wound on the plantar left fifth met head extending laterally almost to the dorsal fifth MTP. We have been using silver alginate we gave him a Darco forefoot off loader. An x-ray did not show evidence of osteomyelitis did note soft tissue emphysema which I think was due to gas tracking through an open wound. There is no doubt in my mind he requires an MRI 10/23; MRI  not booked until 3 November at the earliest this is largely due to his glucose sensor in the right arm. We have been using silver alginate. There has been an improvement 10/29; I am still not exactly sure when his MRI is booked for. He says it is the third but it is the 10th in epic. This definitely needs to be done. He is running a low-grade fever today but no other symptoms. No real improvement in the 1 02/26/2019 patient presents today for a follow-up visit here in our clinic he is last been seen in the clinic on October 29. Subsequently we were working on getting MRI to evaluate and see what exactly was going on and where we would need to go from the standpoint of whether or not he had osteomyelitis and again what treatments were going be required. Subsequently the patient ended up being admitted to the hospital on 02/07/2019 and was discharged on 02/14/2019. This is a somewhat interesting admission with a discharge diagnosis of pneumonia due to COVID-19 although he was positive for COVID-19 when tested at the urgent care but negative x2 when he was actually in the hospital. With that being said he did have acute respiratory failure with hypoxia and it was noted he also have a left foot ulceration with osteomyelitis. With that being said he did require oxygen for his pneumonia and I level 4 L. He was placed on antivirals and steroids for the COVID-19. He was also transferred to the [facility] at one point. Nonetheless he did subsequently discharged home and since being home has done much better in that regard. The CT angiogram did not show any pulmonary embolism. With regard to the osteomyelitis the patient was placed on vancomycin and Zosyn while in the hospital but has been changed to Augmentin at discharge. It was also recommended that he follow- up with wound care and podiatry. Podiatry however wanted him to see [name] according to the patient prior to them doing anything further. His  hemoglobin A1c was 9.9 as noted in the hospital. Have an MRI of the left foot performed while in the hospital on 02/04/2019. 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 There is an overlying plantar open wound noted  an abscess tracking back along the lateral aspect of the fifth metatarsal shaft. There is otherwise diffuse cellulitis and mild fasciitis without findings of polymyositis. The patient did have recently pneumonia secondary to COVID-19 I looked in the chart through epic and it does appear that the patient may need to have an additional x-ray just to ensure everything is cleared and that he has no airspace disease prior to putting him into the [name]. 03/05/2019; patient was readmitted to the clinic last week. He was hospitalized twice for a viral upper respiratory tract infection from 11/1 through 11/4 and then 11/5 through 11/12 ultimately this turned out to be Covid pneumonitis. Although he was discharged on oxygen he is not using it. He says he feels fine. He has no exercise limitation no cough no sputum. His O2 sat in our clinic today was 100% on room air. 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 He received Vanco and Zosyn in the hospital and then was discharged on 2 weeks of Augmentin. I do not see any relevant cultures. He was supposed to follow-up with infectious disease but I do not see that he has an appointment. 12/8; patient saw Dr. Novella Olive of infectious disease last week. He felt that he had had adequate antibiotic therapy. He did not go to follow-up with Dr. Amalia Hailey of podiatry and I have again talked to him about the pros and cons of this. He does not want to consider a ray amputation of this time. He is aware of the risks of recurrence, migration etc. He started HBO  today and tolerated this well. He can complete the Augmentin that I gave him last week. I have looked over the lab work that Dr. Chana Bode ordered his C-reactive protein was 3.3 and his sedimentation rate was 17. The C-reactive protein is never really been measurably that high in this patient 12/15; not much change in the wound today however he has undermining along the lateral part of the foot again more extensively than last week. He has some rims of epithelialization. We have been using silver alginate. He is undergoing hyperbarics but did not dive today 12/18; in for his obligatory first total contact cast change. Unfortunately there was pus coming from the undermining area around his fifth metatarsal head. This was cultured but will preclude reapplication of a cast. He is seen in conjunction with HBO 12/24; patient had staph lugdunensis in the wound in the undermining area laterally last time. We put him on doxycycline which should have covered this. The wound looks better today. I am going to give him another week of doxycycline before reattempting the total contact cast 12/31; the patient is completing antibiotics. Hemorrhagic debris in the distal part of the wound with some undermining distally. He also had hyper granulation. Extensive debridement with a #5 curette. The infected area that was on the lateral part of the fifth met head is closed over. I do not think he needs any more antibiotics. Patient was seen prior to HBO. Preparations for a total contact cast were made in the cast will be placed post hyperbarics 04/11/19; once again the patient arrives today without complaint. He had been in a cast all week noted that he had heavy drainage this week. This resulted in large raised areas of macerated tissue around the wound 1/14; wound bed looks better slightly smaller. Hydrofera Blue has been changing himself. He  had a heavy drainage last week which caused a lot of maceration around the wound so  I took him out of a total contact cast he says the drainage is actually better this week He is seen today in conjunction with HBO 1/21; returns to clinic. He was up in [place] for a day or 2 attending a funeral. He comes back in with the wound larger and with a large area of exposed bone. He had osteomyelitis and septic arthritis of the fifth left metatarsal head while he was in hospital. He received IV antibiotics in the hospital for a prolonged [place], [name] ([id number]) [id number] Page 12 of 21 period of time then 3 weeks of Augmentin. Subsequently I gave him 2 weeks of doxycycline for more superficial wound infection. When I saw this last week the wound was smaller the surface of the wound looks satisfactory. 1/28; patient missed hyperbarics today. Bone biopsy I did last time showed Enterococcus faecalis and Staphylococcus lugdunensis . He has a wide area of exposed bone. We are going to use silver alginate as of today. I had another ethical discussion with the patient. 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 In this situation I think the likelihood of healing this is low. Therefore I have recommended a ray amputation and with the patient's agreement I have referred him to Dr. Doran Durand. The other issue is that his compliance with hyperbarics has been minimal because of his work schedule and given his underlying decision I am going to stop this today READMISSION 10/24/2019 MRI 09/29/2019 left foot IMPRESSION: 1. Apparent skin ulceration inferior and lateral to the 5th metatarsal base with underlying heterogeneous T2 signal and enhancement in the subcutaneous fat. Small peripherally enhancing fluid collections along the plantar and lateral aspects of the 5th metatarsal base suspicious for abscesses. 2. Interval amputation through the mid 5th metatarsal with nonspecific low-level marrow edema and enhancement. Given the proximity  to the adjacent soft tissue inflammatory changes, osteomyelitis cannot be excluded. 3. The additional bones appear unremarkable. MRI 09/29/2019 right foot IMPRESSION: 1. Soft tissue ulceration lateral to the 5th MTP joint. There is low-level T2 hyperintensity within the 4th and 5th metatarsal heads and adjacent proximal phalanges without abnormal T1 signal or cortical destruction. These findings are nonspecific and could be seen with early marrow edema, hyperemia or early osteomyelitis. No evidence of septic joint. 2. Mild tenosynovitis and synovial enhancement associated with the extensor digitorum tendons at the level of the midfoot. 3. Diffuse low-level muscular T2 hyperintensity and enhancement, most consistent with diabetic myopathy. LEFT FOOT BONE Methicillin resistant staphylococcus aureus Staphylococcus lugdunensis MIC MIC CIPROFLOXACIN >=8 RESISTANT Resistant <=0.5 SENSI... Sensitive CLINDAMYCIN <=0.25 SENS... Sensitive >=8 RESISTANT Resistant ERYTHROMYCIN >=8 RESISTANT Resistant >=8 RESISTANT Resistant GENTAMICIN <=0.5 SENSI... Sensitive <=0.5 SENSI... Sensitive Inducible Clindamycin NEGATIVE Sensitive NEGATIVE Sensitive OXACILLIN >=4 RESISTANT Resistant 2 SENSITIVE Sensitive RIFAMPIN <=0.5 SENSI... Sensitive <=0.5 SENSI... Sensitive TETRACYCLINE <=1 SENSITIVE Sensitive <=1 SENSITIVE Sensitive TRIMETH/SULFA <=10 SENSIT Sensitive <=10 SENSIT Sensitive ... Marland Kitchen.. VANCOMYCIN 1 SENSITIVE Sensitive <=0.5 SENSI... Sensitive Right foot bone . Component 3 wk ago Specimen Description BONE Special Requests RIGHT 4 METATARSAL SAMPLE B Gram Stain NO WBC SEEN NO ORGANISMS SEEN Culture RARE METHICILLIN RESISTANT STAPHYLOCOCCUS AUREUS NO ANAEROBES ISOLATED Performed at [facility], [place] [address] [place], [place] [address] Report Status 10/08/2019 FINAL Organism ID, Bacteria METHICILLIN RESISTANT STAPHYLOCOCCUS AUREUS Resulting Agency [facility] Susceptibility Methicillin  resistant staphylococcus aureus MIC CIPROFLOXACIN >=8 RESISTANT Resistant CLINDAMYCIN <=0.25 SENS... Sensitive  ERYTHROMYCIN >=8 RESISTANT Resistant GENTAMICIN <=0.5 SENSI... Sensitive Inducible Clindamycin NEGATIVE Sensitive OXACILLIN >=4 RESISTANT Resistant RIFAMPIN <=0.5 SENSI... Sensitive TETRACYCLINE <=1 SENSITIVE Sensitive TRIMETH/SULFA <=10 SENSIT Sensitive ... VANCOMYCIN 1 SENSITIVE Sensitive This is a patient we had in clinic earlier this year with a wound over his left fifth metatarsal head. 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 Doran Durand on 05/16/2019. He seems to have developed problems with wounds on his bilateral feet in June 2021 although he may have had problems earlier than this. He was in an urgent care with a right foot ulcer on 09/26/2019 and given a course of doxycycline. This was apparently after having trouble getting into see orthopedics. He was seen by podiatry on 09/28/2019 noted to have bilateral lower extremity ulcers including the left lateral fifth metatarsal base and the right subfifth met head. It was noted that had [name], [name] ([id number]) [id number] Page 13 of 21 purulent drainage at that time. He required hospitalization from 6/20 through 7/2. This was because of worsening right foot wounds. He underwent bilateral operative incision and drainage and bone biopsies bilaterally. Culture results are listed above. He has been referred back to clinic by Dr. Jacqualyn Posey of podiatry. He is also followed by Dr. Megan Salon who saw him yesterday. He was discharged from hospital on Zyvox Flagyl and Levaquin and yesterday changed to doxycycline Flagyl and Levaquin. His  inflammatory markers on 6/26 showed a sedimentation rate of 129 and a C-reactive protein of 5. This is improved to 14 and 1.3 respectively. This would indicate improvement. ABIs in our clinic today were 1.23 on the right and 1.20 on the left 11/01/2019 on evaluation today patient appears to be doing fairly well in regard to the wounds on his feet at this point. Fortunately there is no signs of active infection at this time. No fevers, chills, nausea, vomiting, or diarrhea. He currently is seeing infectious disease and still under their care at this point. Subsequently he also has both wounds which she has not been using collagen on as he did not receive that in his packaging he did not call us and let us know that. Apparently that just was missed on the order. Nonetheless we will get that straightened out today. 8/9-Patient returns for bilateral foot wounds, using Prisma with hydrogel moistened dressings, and the wounds appear stable. Patient using surgical shoes, avoiding much pressure or weightbearing as much as possible 8/16; patient has bilateral foot wounds. 1 on the right lateral foot proximally the other is on the left mid lateral foot. Both required debridement of callus and thick skin around the wounds. We have been using silver collagen 8/27; patient has bilateral lateral foot wounds. The area on the left substantially surrounded by callus and dry skin. This was removed from the wound edge. The underlying wound is small. The area on the right measured somewhat smaller today. 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 Unfortunately I did not update his antibiotics during today's visit. 9/10 I reviewed Dr. Hale Bogus last notes he felt he had completed antibiotics his inflammatory markers were reasonably well controlled. He has a small wound on the lateral left foot and a tiny area on the right which is  just above closed. He is  using Hydrofera Blue with border foam he has bilateral surgical shoes 9/24; 2 week f/u. doing well. right foot is closed. left foot still undermined. 10/14; right foot remains closed at the fifth met head. The area over the base of the left fifth metatarsal has a small open area but considerable undermining towards the plantar foot. Thick callus skin around this suggests an adequate pressure relief. We have talked about this. He says he is going to go back into his cam boot. I suggested a total contact cast he did not seem enamored with this suggestion 10/26; left foot base of the fifth metatarsal. Same condition as last time. He has skin over the area with an open wound however the skin is not adherent. He went to see Dr. Earleen Newport who did an x-ray and culture of his foot I have not reviewed the x-ray but the patient was not told anything. He is on doxycycline 11/11; since the patient was last here he was in the emergency room on 10/30 he was concerned about swelling in the left foot. They did not do any cultures or x-rays. They changed his antibiotics to cephalexin. Previous culture showed group B strep. The cephalexin is appropriate as doxycycline has less than predictable coverage. Arrives in clinic today with swelling over this area under the wound. He also has a new wound on the right fifth metatarsal head 11/18; the patient has a difficult wound on the lateral aspect of the left fifth metatarsal head. The wound was almost ballotable last week I opened it slightly expecting to see purulence however there was just bleeding. I cultured this this was negative. X-ray unchanged. We are trying to get an MRI but I am not sure were going to be able to get this through his insurance. He also has an area on the right lateral fifth metatarsal head this looks healthier 12/3; the patient finally got our MRI. Surprisingly this did not show osteomyelitis. I did show the soft tissue ulceration at the lateral plantar  aspect of the fifth metatarsal base with a tiny residual 6 mm abscess overlying the superficial fascia I have tried to culture this area I have not been able to get this to grow anything. Nevertheless the protruding tissue looks aggravated. I suspect we should try to treat the underlying "abscess with broad-spectrum antibiotics. I am going to start him on Levaquin and Flagyl. He has much less edema in his legs and I am going to continue to wrap his legs and see him weekly 12/10. I started Levaquin and Flagyl on him last week. He just picked up the Flagyl apparently there was some delay. The worry is the wound on the left fifth metatarsal base which is substantial and worsening. His foot looks like he inverts at the ankle making this a weightbearing surface. Certainly no improvement in fact I think the measurements of this are somewhat worse. We have been using 12/17; he apparently just got the Levaquin yesterday this is 2 weeks after the fact. He has completed the Flagyl. The area over the left fifth metatarsal base still has protruding granulation tissue although it does not look quite as bad as it did some weeks ago. He has severe bilateral lymphedema although we have not been treating him for wounds on his legs this is definitely going to require compression. There was so much edema in the left I did not wish to put him in a total contact cast today. I  am going to increase his compression from 3-4 layer. The area on the right lateral fifth met head actually look quite good and superficial. 12/23; patient arrived with callus on the right fifth met head and the substantial hyper granulated callused wound on the base of his fifth metatarsal. He says he is completing his Levaquin in 2 days but I do not think that adds up with what I gave him but I will have to double check this. We are using Hydrofera Blue on both areas. My plan is to put the left leg in a cast the week after New Year's 04/06/2020;  patient's wounds about the same. Right lateral fifth metatarsal head and left lateral foot over the base of the fifth metatarsal. There is undermining on the left lateral foot which I removed before application of total contact cast continuing with Hydrofera Blue new. Patient tells me he was seen by endocrinology today lab work was done [Dr. Kerr]. Also wondering whether he was referred to cardiology. I went over some lab work from previously does not have chronic renal failure certainly not nephrotic range proteinuria he does have very poorly controlled diabetes but this is not his most updated lab work. Hemoglobin A1c has been over 11 1/10; the patient had a considerable amount of leakage towards mid part of his left foot with macerated skin however the wound surface looks better the area on the right lateral fifth met head is better as well. I am going to change the dressing on the left foot under the total contact cast to silver alginate, continue with Hydrofera Blue on the right. 1/20; patient was in the total contact cast for 10 days. Considerable amount of drainage although the skin around the wound does not look too bad on the left foot. The area on the right fifth metatarsal head is closed. Our nursing staff reports large amount of drainage out of the left lateral foot wound 1/25; continues with copious amounts of drainage described by our intake staff. PCR culture I did last week showed E. coli and Enterococcus faecalis and low quantities. Multiple resistance genes documented including extended spectrum beta lactamase, MRSA, MRSE, quinolone, tetracycline. The wound is not quite as good this week as it was 5 days ago but about the same size 2/3; continues with copious amounts of malodorous drainage per our intake nurse. The PCR culture I did 2 weeks ago showed E. coli and low quantities of Enterococcus. There were multiple resistance genes detected. I put Neosporin on him last week although  this does not seem to have helped. The wound is slightly deeper today. Offloading continues to be an issue here although with the amount of drainage she has a total contact cast is just not going to work 2/10; moderate amount of drainage. Patient reports he cannot get his stocking on over the dressing. I told him we have to do that the nurse gave him suggestions on how to make this work. The wound is on the bottom and lateral part of his left foot. Is cultured predominantly grew low amounts of Enterococcus, E. coli and anaerobes. There were multiple resistance genes detected including extended spectrum beta lactamase, quinolone, tetracycline. I could not think of an easy oral combination to address this so for now I am going to do topical antibiotics provided by [facility] I think the main agents here are vancomycin and an aminoglycoside. We have to be able to give him access to the wounds to get the topical antibiotic on  2/17; moderate amount of drainage this is unchanged. He has his Keystone topical antibiotic against the deep tissue culture organisms. He has been using this and changing the dressing daily. Silver alginate on the wound surface. 2/24; using Keystone antibiotic with silver alginate on the top. He had too much drainage for a total contact cast at one point although I think that is improving and I think in the next week or 2 it might be possible to replace a total contact cast I did not do this today. In general the wound surface looks healthy however he continues to have thick rims of skin and subcutaneous tissue around the wide area of the circumference which I debrided 06/04/2020 upon evaluation today patient appears to be doing well in regard to his wound. I do feel like he is showing signs of improvement. There is little bit of callus and dead tissue around the edges of the wound as well as what appears to be a little bit of a sinus tract that is off to the side laterally I would  perform debridement to clear that away today. 3/17; left lateral foot. The wound looks about the same as I remember. Not much depth surface looks healthy. No evidence of infection 3/25; left lateral foot. Wound surface looks about the same. Separating epithelium from the circumference. There really is no evidence of infection here however not making progress by my view 3/29; left lateral foot. Surface of the wound again looks reasonably healthy still thick skin and subcutaneous tissue around the wound margins. There is no evidence of infection. One of the concerns being brought up by the nurses has again the amount of drainage vis--vis continued use of a total contact cast 4/5; left lateral foot at roughly the base of the fifth metatarsal. Nice healthy looking granulated tissue with rims of epithelialization. The overall wound [name], [name] ([id number]) [id number] Page 14 of 21 measurements are not any better but the tissue looks healthy. The only concern is the amount of drainage although he has no surrounding maceration with what we have been doing recently to absorb fluid and protect his skin. He also has lymphedema. 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 He states that the kerlix/coban under the TCC rolled down and could not get it back up. He has been using an offloading scooter and has somehow hurt his right foot using this device. This happened last week. He states that the side of his right foot developed a blister and opened. The top of his foot also has a few small open wounds he thinks is due to his socks rubbing in his shoes. He has not been using any dressings to the wound. He denies purulent drainage,  fever/chills or erythema to the wounds. 4/22; patient presents for 1 week follow-up. He developed new wounds to the right foot that were evaluated at last clinic visit. He continues to have a total contact cast to the left leg and he reports no issues. He has been using silver collagen to the right foot wounds with no issues. He denies purulent drainage, fever/chills or erythema to the right foot wounds. He has no complaints today 4/25; patient presents for 1 week follow-up. He has a total contact  cast of the left leg and reports no issues. He has been using silver alginate to the right foot wound. He denies purulent drainage, fever/chills or erythema to the right foot wounds. 5/2 patient presents for 1 week follow-up. T contact cast on the left. The wound which is on the base of the plantar foot at the base of the fifth metatarsal otal actually looks quite good and dimensions continue to gradually contract. HOWEVER the area on the right lateral fifth metatarsal head is much larger than what I remember from 2 weeks ago. Once more is he has significant levels of hypergranulation. Noteworthy that he had this same hyper granulated response on his wound on the left foot at one point in time. So much so that he I thought there was an underlying fluid collection. Based on this I think this just needs debridement. 5/9; the wound on the left actually continues to be gradually smaller with a healthy surface. Slight amount of drainage and maceration of the skin around but not too bad. However he has a large wound over the right fifth metatarsal head very much in the same configuration as his left foot wound was initially. I used silver nitrate to address the hyper granulated tissue no mechanical debridement 5/16; area on the left foot did not look as healthy this week deeper thick surrounding macerated skin and subcutaneous tissue. oo The area on the right foot fifth met head was about the same oo The area  on the right ankle that we identified last week is completely broken down into an open wound presumably a stocking rubbing issue 5/23; patient has been using a total contact cast to the left side. He has been using silver alginate underneath. He has also been using silver alginate to the right foot wounds. He has no complaints today. He denies any signs of infection. 5/31; the left-sided wound looks some better measure smaller surface granulation looks better. We have been using silver alginate under the total contact cast oo The large area on his right fifth met head and right dorsal foot look about the same still using silver alginate 6/6; neither side is good as I was hoping although the surface area dimensions are better. A lot of maceration on his left and right foot around the wound edge. Area on the dorsal right foot looks better. He says he was traveling. I am not sure what does the amount of maceration around the plantar wounds may be drainage issues 6/13; in general the wound surfaces look quite good on both sides. Macerated skin and raised edges around the wound required debridement although in general especially on the left the surface area seems improved. oo The area on the right dorsal ankle is about the same I thought this would not be such a problem to close 6/20; not much change in either wound although the one on the right looks a little better. Both wounds have thick macerated edges to the skin requiring debridements. We have been using silver alginate. The area on the dorsal right ankle is still open I thought this would be closed. 6/28; patient comes in today with a marked deterioration in the right foot wound fifth met head. Wide area of exposed bone this is a drastic change from last time. The area on the left there we have been casting is stagnant. We have been using silver alginate in both wound areas. 7/5; bone culture I did for PCR last time was positive for Pseudomonas,  group  B strep, Enterococcus and Staph aureus. There was no suggestion of methicillin resistance or ampicillin resistant genes. This was resistant to tetracycline however He comes into the clinic today with the area over his right plantar fifth metatarsal head which had been doing so well 2 weeks ago completely necrotic feeling bone. I do not know that this is going to be salvageable. The left foot wound is certainly no smaller but it has a better surface and is superficial. 7/8; patient called in this morning to say that his total contact cast was rubbing against his foot. He states he is doing fine overall. He denies signs of infection. 7/12; continued deterioration in the wound over the right fifth metatarsal head crumbling bone. This is not going to be salvageable. The patient agrees and wants to be referred to Dr. Doran Durand which we will attempt to arrange as soon as possible. I am going to continue him on antibiotics as long as that takes so I will renew those today. The area on the left foot which is the base of the fifth metatarsal continues to look somewhat better. Healthy looking tissue no depth no debridement is necessary here. 7/20; the patient was kindly seen by Dr. Doran Durand of orthopedics on 10/19/2020. 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 Towards this end we have taken him out of the total contact cast on the left we will put him in a wrap with Hydrofera Blue. As I understand things surgery is planned for 7/21 7/27; patient had his surgery last Thursday. 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 Apparently everything went well we did not still disturb that today The area on the left foot actually looks quite good. He has been much less mobile which probably explains this he did not seem to do well in the total contact cast secondary to drainage and maceration I think. We have been using  Hydrofera Blue 11/09/2020 upon evaluation today patient appears to be doing well with regard to his plantar foot ulcer on the left foot. Fortunately there is no evidence of active infection at this time. No fevers, chills, nausea, vomiting, or diarrhea. Overall I think that he is actually doing extremely well. Nonetheless I do believe that he is staying off of this more following the surgery in his right foot that is the reason the left is doing so great. 8/16; left plantar foot wound. This looks smaller than the last time I saw this he is using Hydrofera Blue. The surgical wound on the right foot is being followed by Dr. Doran Durand we did not look at this today. He has surgical shoes on both feet 8/23; left plantar foot wound not as good this week. Surrounding macerated skin and subcutaneous tissue everything looks moist and wet. I do not think he is offloading this adequately. He is using a surgical shoe Apparently the right foot surgical wound is not open although I did not check his foot 8/31; left plantar foot lateral aspect. Much improved this week. He has no maceration. Some improvement in the surface area of the wound but most impressively the depth is come in we are using silver alginate. The patient is a Product/process development scientist. He is asked that we write him a letter so he can go back to work. I have also tried to see if we can write something that will allow him to limit  the amount of time that he is on his foot at work. 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 Hopefully the latter can be avoided 9/6; I believe the patient missed an appointment last week. He arrives in today with a wound looking roughly the same certainly no better. Undermining laterally and also inferiorly. We used molecuLight today in training with the patient's permission.. We are using silver alginate 9/21 wound is measuring bigger this week  although this may have to do with the aggressive circumferential debridement last week in response to the blush fluorescence on the MolecuLight. Culture I did last week showed significant MSSA and E. coli. I put him on Augmentin but he has not started it yet. We are also going to send this for compounded antibiotics at [facility]. There is no evidence of systemic infection 9/29; silver alginate. His Keystone arrived. He is completing Augmentin in 2 days. Offloading in a cam boot. Moderate drainage per our intake staff [name], [name] ([id number]) [id number] Page 15 of 21 10/5; using silver alginate. He has been using his [place]. He has completed his Augmentin. Per our intake nurse still a lot of drainage, far too much to consider a total contact cast. Wound measures about the same. He had the same undermining area that I defined last week from a roughly 11-3. I remove this today 10/12; using silver alginate he is using the [place]. He comes in for a nurse visit hence we are applying [employer] twice a week. Measuring slightly better today and less notable drainage. Extensive debridement of the wound edge last time 10/18; using topical Keystone and silver alginate and a soft cast. Wound measurements about the same. Drainage was through his soft cast. We are changing this twice a week Tuesdays and Friday 10/25; comes in with moderate drainage. Still using Keystone silver alginate and a soft cast. Wound dimensions completely the same.He has a lot of edema in the left leg he has lymphedema. Asking for [name] to consider wrapping him as he cannot get his stocking on over the soft cast 11/2; comes in with moderate to large drainage slightly smaller in terms of width we have been using [place]. His wound looks satisfactory but not much improvement 11/4; patient presents today for obligatory cast change. Has no issues or complaints today. He denies signs of infection. 11/9; patient  traveled this weekend to [place], was on the cast quite a bit. Staining of the cast with black material from his walking boot. Drainage was not quite as bad as we feared. Using silver alginate and Keystone 11/16; we do not have size for cast therefore we have been putting a soft cast on him since the change on Friday. Still a significant amount of drainage necessitating changing twice a week. We have been using the Keystone at cast changes either hard or soft as well as silver alginate Comes in the clinic with things actually looking fairly good improvement in width. He says his offloading is about the same 02/24/2021 upon evaluation today patient actually comes back in and is doing excellent in regard to his foot ulcer this is significantly smaller even compared to the last visit. The soft cast seems to have done extremely well for him which is great news. I do not see any signs of infection minimal debridement will be needed today. 11/30; left lateral foot much improved half a centimeter improvement in surface area. No evidence  of infection. He seems to be doing better with the soft cast in the TCC therefore we will continue with this. He comes back in later in the week for a change with the nurses. This is due to drainage 12/6; no improvement in dimensions. Under illumination some debris on the surface we have been using silver alginate, soft cast. If there is anything optimistic here he seems to have have less drainage 12/13. Dimensions are improved both length and width and slightly in depth. Appears to be quite healthy today. Raised edges of this thick skin and callus around the edges however. He is in a soft cast were bringing him back once for a change on Friday. Drainage is better 12/20. Dimensions are improved. He still has raised edges of thick skin and callus around the edges. We are using a soft cast 12/28; comes in today with thick callus around the wound. Using silver under alginate under a  soft cast. I do not think there is much improvement in any measurement 2023 04/06/2021; patient was put in a total contact cast. Unfortunately not much change in surface area 1/10; not much different still thick callus and skin around the edge in spite of the total contact cast. This was just debrided last week we have been using the [facility] compounded antibiotic and silver alginate under a total contact cast 1/18 the patient's wound on the left side is doing nicely. smaller HOWEVER he comes in today with a wound on the right foot laterally. blister most likely serosangquenous drainage 1/24; the patient continues to do well in terms of the plantar left foot which is continued to contract using silver alginate under the total contact cast HOWEVER the right lateral foot is bigger with denuded skin around the edges. I used pickups and a #15 scalpel to remove this this looks like the remanence of a large blister. Cannot rule out infection. Culture in this area I did last week showed Staphylococcus lugdunensis few colonies. I am going to try to address this with his [employer] antibiotic that is done so well on the left having linezolid and this should cover the staph 2/1; the patient's wound on his left foot which was the original plantar foot wound thick skin and eschar around the edges even in the total contact cast but the wound surface does not look too bad The real problem is on how his right lateral foot at roughly the base of the fifth metatarsal. The wound is completely necrotic more worrisome than that there is swelling around the edges of this. We have been using silver alginate on both wounds and Keystone on the right foot. Unfortunately I think he is going to require systemic antibiotics while we await cultures. He did not get the x-ray done that we ordered last week [lost the prescription 2/7; disappointingly in the area on the left foot which we are treating with a total contact cast is still  not closed although it is much smaller. He continues to have a lot of callus around the wound edge. -Right lateral foot culture I did last week was negative x-ray also negative for osteomyelitis. 2/15: TCC silver alginate on the left and silver alginate on the right lateral. No real improvement in either area 05/26/2021: T oday, the wounds are roughly the same size as at his previous visit, post-debridement. He continues to endorse fairly substantial drainage, particularly on the right. He has been in a total contact cast on the left. There is still some  callus surrounding this lesion. On the right, the periwound skin is quite macerated, along with surrounding callus. The center of the right-sided wound also has some dark, densely adherent material, which is very difficult to remove. 06/02/2021: Today, both wounds are slightly smaller. He has been using zinc oxide ointment around the right ulcer and the degree of maceration has improved markedly. There continues to be an area of nonviable tissue in the center of the right sided ulcer. The left-sided wound, which has been in the total contact cast. Appears clean and the degree of callus around it is less than previously. 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 He had to take the stairs and both wounds have increased in size. The left foot, which has been in a total contact cast, has developed a tunnel tracking to the lateral aspect of his foot. The nonviable tissue in the center of the right-sided ulcer remains recalcitrant to debridement. There is significant undermining surrounding the entirety of the left sided wound. 06/16/2021: The elevator at school has been fixed and the patient has been able to avoid putting as much weight on his wounds over the past week. We converted the left foot wound into a single lesion today, but despite this, the wound is actually smaller. The base is  healthy with limited periwound callus. On the right, the central necrotic area is still present. He continues to be quite macerated around the right sided wound, despite applying barrier cream. This does, however, have the benefit of softening the callus to make it more easily removable. 06/23/2021: Today, the left wound is smaller. The lateral aspect that had opened up previously is now closed. The wound base has a healthy bed of granulation tissue and minimal slough. Unfortunately, on the right, the wound is larger and continues to be fairly macerated. He has also reopened the wound at his right ankle. He thinks this is due to the gait he has adopted secondary to his total contact cast and boot. 06/30/2021: T oday, both wounds are a little bit larger. The lateral aspect on the left has remained closed. He continues to have significant periwound maceration. The culture that I took from the right sided wound grew a population of bacteria that is not covered by his current [facility] antibiotic. The center of the right- sided wound continues to appear necrotic with nonviable fat. It probes deeper today, but does not reach bone. 07/07/2021: The periwound maceration is a little bit less today. The right lateral foot wound has some areas that appear more viable and the necrotic center also looks a little bit better. The wound on the dorsal surface of his right foot near the ankle is contracting and the surface appears healthy. The left plantar wound surface looks healthy, but there is some new undermining on the medial portion. He did get his new Keystone antibiotic and began applying that to the right foot wound on Saturday. [name], [name] ([id number]) [id number] Page 16 of 21 07/14/2021: The intake nurse reported substantial drainage from his wounds, but the periwound skin actually looks better than is typical for him. The wound on the dorsal surface of his right foot near the  ankle is smaller and just has a small open area underneath some dried eschar. The left plantar wound surface looks healthy and there has been no significant accumulation of callus. The right lateral foot wound looks quite a bit better, with the central portion, which  typically appears necrotic, looking more viable albeit pale. 07/22/2021: His left foot is extremely macerated today. The wound is about the same size. The wound on the dorsal surface of his right foot near the ankle had closed, but he traumatized it removing the dressing and there is a tiny skin tear in that location. The right lateral foot wound is bigger, but the surface appears healthy. 07/30/2021: The wound on the dorsal surface of his right foot near the ankle is closed. The right lateral foot wound again is a little bit bigger due to some undermining. The periwound skin is in better condition, however. He has been applying zinc oxide. The wound surface is a little bit dry today. On the left, he does not have the substantial maceration that we frequently see. The wound itself is smaller and has a clean surface. 08/06/2021: Both wounds seem to have deteriorated over the past week. The right lateral foot wound has a dry surface but the periwound is boggy.. Overall wound dimensions are about the same. On the left, the wound is about the same size, but there is more undermining present underneath periwound callus. 08/13/2021: The right sided wound looks about the same, but on the left there has been substantial deterioration. The undermining continues to extend under periwound callus. Once this was removed, substantial extension of the wound was present. There is no odor or purulent drainage but clearly the wounds have broken down. 08/20/2021: The wounds look about the same today. He has been out of his total contact cast and has just been changing the dressings using topical Keystone with PolyMem Ag, Kerlix and Ace bandages. The wound on the  top of his right ankle has reopened but this is quite small. There was a little bit of purulent material that I expressed when examining this wound. 08/24/2021: After the aggressive debridement I performed at his last visit, the wounds actually look a little bit better today. They are smaller with the exception of the wound on the top of his right ankle which is a little bit bigger as some more skin pulled off when he was changing his dressing. We are using topical Keystone with PolyMem Ag Kerlix and Ace bandages. 09/02/2021: There has been really no change to any of his wounds. 09/16/2021: The patient was hospitalized last week with nausea, vomiting, and dehydration. He says that while he was in the hospital, his wounds were not really addressed properly. T oday, both plantar foot wounds are larger and the periwound skin is macerated. The wound on the dorsum of his right foot has a scab on the top. The right foot now has a crater where previously he had had nonviable fat. It looks as though this simply died and fell out. The periwound callus is wet. 09/24/2021: His wounds have deteriorated somewhat since his last visit. The wound on the dorsum of his right foot near his ankle is larger and has more nonviable tissue present. The crater in his right foot is even deeper; I cannot quite palpate or probe to bone but I am sure it is close. The wound on his left plantar foot has an odd boggy area in the center that almost feels as though it has fluid within it. He has run out of his topical Keystone antibiotic. We are using silver alginate on his wounds. 09/29/2021: He has developed a new wound on the dorsum of his left foot near his ankle. He says he thinks his wrapping is rubbing in that  site. I would concur with this as the wound on his right ankle is larger. The left foot looks about the same. The right foot has the crater that was present last week. No significant slough accumulation, but his foot remains  quite swollen and warm despite oral antibiotic therapy. 10/08/2021: All of his wounds look about the same as last week. He did not start his oral antibiotics that are prescribed until just a couple of days ago; his [employer] compounded antibiotics formula has been changed and he is awaiting delivery of the new recipe. His MRI that was scheduled for earlier this week was canceled as no prior authorization had been obtained; unfortunately the tech responsible sent an email to my old [employer] email, which I no longer use nor have access to. 10/18/2021: The wounds on his bilateral dorsal feet near the ankles are both improved. They are smaller and have just some eschar and slough buildup. The left plantar wound has a fair amount of undermining, but the surface is clean. There is some periwound callus accumulation. On the right plantar foot, there is nonviable fat leading to a deep tunnel that tracks towards his dorsal medial foot. There is periwound callus and slough accumulation, as well. His right foot and leg remain swollen as compared to the left. 10/25/2021: The wounds on his bilateral dorsal feet and at the ankles have broken down somewhat. They are little bit larger than last week. The left plantar wound continues to undermine laterally but the surface is clean. The right plantar foot wound shows some decreased depth in the tunnel tracking towards his dorsal medial foot. He has not yet had the Doppler study that I ordered; it sounds like there is some confusion about the scheduling of the procedure. In addition, the MRI was denied and I have taken steps to appeal the denial. 11/24/2021: Since our last visit, Mr. Goodell was admitted to the hospital where an MRI suggested osteomyelitis. He was taken the operating room by podiatry. Bone biopsies were negative for osteomyelitis. They debrided his wounds and applied myriad matrix. He saw them last week and they removed his staples. He is here today to  continue his wound healing process. T oday, both of the dorsal foot/ankle wounds are substantially smaller. There is just a little eschar overlying the left sided wound and some eschar and slough on the right. The right plantar foot ulcer has the healthiest surface of granulation tissue that I have seen to date. A portion of the myriad matrix failed to take and was hanging loose. It appears that myriad morcells were placed into the tunnel closest to the dorsal portion of his foot. These have sloughed off. The left plantar foot ulcer is about the same size, but has a much healthier surface than in the past. Both plantar ulcers have callus and slough accumulation. 12/07/2021: Left dorsal foot/ankle wound is closed. The right dorsal foot/ankle wound is nearly closed and just has a small open area with some eschar and slough. The right plantar foot wound has contracted quite a bit since our last visit. It has a healthy surface with just a little bit of slough accumulation and periwound callus buildup. The left plantar foot wound is about the same size but the surface appears healthy. There is a little slough and periwound callus on this side, as well. 12/15/2021: Both dorsal foot/ankle wounds are closed. The right plantar foot wound is substantially smaller than at our last visit. The tunneling that was present  has nearly closed. There is just a little bit of slough buildup. The left plantar foot wound is also a little bit smaller today. The surface is the healthiest that I have ever seen it. Light slough and periwound callus accumulation on this side. 12/22/2021: The dorsal ankle/foot wounds remain closed. The right plantar foot wound continues to contract. There is still a bit of depth at the lateral portion of the wound but the surface has a good granulated appearance. The wound on the left is about the same size, the central indented portion is still adherent. It also is clean with good granulation tissue  present. The periwound skin and callus are a bit macerated, however. 12/29/2021: Both ankle wounds remain closed. The right plantar foot wound is less than half the size that it was last week. There is no depth any longer and the surface has just a little bit of slough and periwound callus. On the left, the wound is not much smaller, but the surface is healthier with good granulation tissue. There is also a little slough and periwound callus buildup. 01/05/2022: The right plantar foot wound continues to contract and is once again about half the size as it was last week. There is just a little bit of surface slough and periwound callus. On the left, the depth of the wound has decreased and the diameter has started to contract. It is also very clean with just a little slough and biofilm present. 01/12/2022: The right plantar foot wound is smaller again today. There is just some periwound callus and slough accumulation. On the left, there is a fair amount of undermining and the surface is a little boggy, but no other significant change. 01/19/2022: The right plantar wound continues to contract. There is minimal periwound callus accumulation and just a light layer of slough on the surface. On [name], [name] ([id number]) [id number] Page 17 of 21 the left, the surface looks better, but there is fairly significant undermining near the 11:00 portion of the wound, aiming towards his great toe. The skin overlying this area is very healthy and has a good fat layer present. No malodor or purulent drainage. 01/26/2022: The right sided wound continues to contract and has just a light layer of slough on the surface. Minimal periwound callus. On the left, he still has substantial undermining and the tissue surface is not as robust as I would like to see. No malodor or purulent drainage, but he did say he had a lot of serous drainage over the weekend. 02/02/2022: The right foot wound is  about a quarter of the size that it was at his last visit. There is just a little slough on the surface and some periwound callus. On the left, the undermining persists and the tissue is still more pale, but he does not seem to have had as much drainage over the last weekend. We are waiting on a wound VAC. 02/09/2022: His right foot has healed. He received the wound VAC, but did not bring it to clinic with him today. The lateral aspect of the left foot wound has come in a little, but he still has substantial undermining on the medial and distal portion of the wound. Still with macerated periwound callus. 02/16/2022: The wound VAC was applied last Friday. T oday, there has been tremendous improvement in the surface of the wound bed. The undermined portion of the wound has come in a bit. There is still some overhanging dead skin. Overall the wound looks much better.  02/23/2022: No significant change in the overall wound dimensions, but the wound surface continues to improve and appears healthier and more robust. There is some periwound callus accumulation and overhanging skin. No concern for infection. Patient History Information obtained from Patient. Family History No family history of Cancer, Diabetes, Hereditary Spherocytosis, Hypertension, Kidney Disease, Lung Disease, Seizures, Stroke, Thyroid Problems, Tuberculosis. Social History Never smoker, Marital Status - Single, Alcohol Use - Rarely, Drug Use - No History, Caffeine Use - Never. Medical History Eyes Denies history of Cataracts, Glaucoma, Optic Neuritis Ear/Nose/Mouth/Throat Denies history of Chronic sinus problems/congestion, Middle ear problems Hematologic/Lymphatic Denies history of Anemia, Hemophilia, Human Immunodeficiency Virus, Lymphedema, Sickle Cell Disease Respiratory Denies history of Aspiration, Asthma, Chronic Obstructive Pulmonary Disease (COPD), Pneumothorax, Sleep Apnea, Tuberculosis Cardiovascular Denies history of  Angina, Arrhythmia, Congestive Heart Failure, Coronary Artery Disease, Deep Vein Thrombosis, Hypertension, Hypotension, Myocardial Infarction, Peripheral Arterial Disease, Peripheral Venous Disease, Phlebitis, Vasculitis Gastrointestinal Denies history of Cirrhosis , Colitis, Crohnoos, Hepatitis A, Hepatitis B, Hepatitis C Endocrine Patient has history of Type II Diabetes Denies history of Type I Diabetes Immunological Denies history of Lupus Erythematosus, Raynaudoos, Scleroderma Integumentary (Skin) Denies history of History of Burn Musculoskeletal Denies history of Gout, Rheumatoid Arthritis, Osteoarthritis, Osteomyelitis Neurologic Denies history of Dementia, Neuropathy, Quadriplegia, Paraplegia, Seizure Disorder Oncologic Denies history of Received Chemotherapy, Received Radiation Psychiatric Denies history of Anorexia/bulimia, Confinement Anxiety Hospitalization/Surgery History - 11/1-11/06/2018- sepsis foot infection. - 11/4-11/5 02 sats low respiratory distress. Objective Constitutional Slightly tachycardic, asymptomatic.Marland Kitchen No acute distress. Vitals Time Taken: 8:29 AM, Height: 77 in, Weight: 280 lbs, BMI: 33.2, Temperature: 98.1 F, Pulse: 103 bpm, Respiratory Rate: 18 breaths/min, Blood Pressure: 136/76 mmHg, Capillary Blood Glucose: 124 mg/dl. General Notes: glucose per pt report this am Respiratory Normal work of breathing on room air. General Notes: 02/23/2022: No significant change in the overall wound dimensions, but the wound surface continues to improve and appears healthier and more robust. There is some periwound callus accumulation and overhanging skin. No concern for infection. Integumentary (Hair, Skin) [name], [name] ([id number]) [id number] Page 18 of 21 Wound #3 status is Open. Original cause of wound was Trauma. The date acquired was: 10/02/2019. The wound has been in treatment 121 weeks. The wound is located on the  [place]. The wound measures 3.3cm length x 3.1cm width x 1.2cm depth; 8.035cm^2 area and 9.642cm^3 volume. There is Fat Layer (Subcutaneous Tissue) exposed. There is no tunneling noted, however, there is undermining starting at 3:00 and ending at 6:00 with a maximum distance of 1.7cm. There is a medium amount of serosanguineous drainage noted. The wound margin is distinct with the outline attached to the wound base. There is large (67-100%) red granulation within the wound bed. There is a small (1-33%) amount of necrotic tissue within the wound bed including Adherent Slough. The periwound skin appearance had no abnormalities noted for color. The periwound skin appearance exhibited: Callus, Maceration. Periwound temperature was noted as No Abnormality. 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 There was a Selective/Open Wound Skin/Epidermis Debridement with a total area of 3.3 sq cm performed by [name], MD. With the following instrument(s): Curette to remove Non-Viable tissue/material. Material removed includes Callus and Skin: Epidermis and. No specimens were taken. A  time out was conducted at 08:55, prior to the start of the procedure. A Minimum amount of bleeding was controlled with Pressure. The procedure was tolerated well with a pain level of 0 throughout and a pain level of 0 following the procedure. Post Debridement Measurements: 3.3cm length x 3.1cm width x 1.2cm depth; 9.642cm^3 volume. Character of Wound/Ulcer Post Debridement is improved. Severity of Tissue Post Debridement is: Fat layer exposed. Post procedure Diagnosis Wound #3: Same as  Pre-Procedure General Notes: scribed by Baruch Gouty, RN for Dr. [name]. Plan Follow-up Appointments: Return Appointment in 1 week. - Dr. [name] - Room 1 with Delice Lesch 11/29 @ 08:15 am Bathing/ Shower/ Hygiene: May shower and wash wound with soap and water. Negative Presssure Wound Therapy: Wound #3 Left,Lateral,Plantar Foot: Wound Vac to wound continuously at 140m/hg pressure Black Foam Edema Control - Lymphedema / SCD / Other: Elevate legs to the level of the heart or above for 30 minutes daily and/or when sitting, a frequency of: - throughout the day Avoid standing for long periods of time. Exercise regularly Compression stocking or Garment 20-30 mm/Hg pressure to: - to both legs daily Off-Loading: Open toe surgical shoe to: - Both feet Additional Orders / Instructions: Follow Nutritious Diet Non Wound Condition: Apply the following to affected area as directed: - continue to apply foam donut or cushion to healed right plantar foot WOUND #3: - Foot Wound Laterality: Plantar, Left, Lateral Cleanser: Soap and Water 3 x Per Week/30 Days Discharge Instructions: May shower and wash wound with dial antibacterial soap and water prior to dressing change. Prim Dressing: VAC 3 x Per Week/30 Days ary Com pression Wrap: Kerlix Roll 4.5x3.1 (in/yd) (Generic) 3 x Per Week/30 Days Discharge Instructions: Apply Kerlix and Coban compression as directed. 02/23/2022: No significant change in the overall wound dimensions, but the wound surface continues to improve and appears healthier and more robust. There is some periwound callus accumulation and overhanging skin. No concern for infection. I used a curette to debride the callus and skin from around the wound. We will continue to employ the wound VAC. He is having a nice response to this modality. Follow-up in 1 week. Electronic Signature(s) Signed: 02/23/2022 9:07:10 AM By: CFredirick MaudlinMD FACS Entered By: CFredirick Maudlinon 02/23/2022  09:07:09 AAinsworth [name]([id number] Page 19 of 21 -------------------------------------------------------------------------------- HxROS Details Patient Name: Date of Service: AThom ChimesD 02/23/2022 8:15 A M Medical Record Number: [id number] Account Number: [id number] of Birth/Sex: Treating RN: [birth year](36y.o. M) Primary Care Provider: PSeward CarolOther Clinician: Referring Provider: Treating Provider/Extender: COsborn Cohoin Treatment: 171Information Obtained From Patient Eyes Medical History: Negative for: Cataracts; Glaucoma; Optic Neuritis Ear/Nose/Mouth/Throat Medical History: Negative for: Chronic sinus problems/congestion; Middle ear problems Hematologic/Lymphatic Medical History: Negative for: Anemia; Hemophilia; Human Immunodeficiency Virus; Lymphedema; Sickle Cell Disease Respiratory Medical History: Negative for: Aspiration; Asthma; Chronic Obstructive Pulmonary Disease (COPD); Pneumothorax; Sleep Apnea; Tuberculosis Cardiovascular Medical History: Negative for: Angina; Arrhythmia; Congestive Heart Failure; Coronary Artery Disease; Deep Vein Thrombosis; Hypertension; Hypotension; Myocardial Infarction; Peripheral Arterial Disease; Peripheral Venous Disease; Phlebitis; Vasculitis Gastrointestinal Medical History: Negative for: Cirrhosis ; Colitis; Crohns; Hepatitis A; Hepatitis B; Hepatitis C Endocrine Medical History: Positive for: Type II Diabetes Negative for: Type I Diabetes Time with diabetes: 8 Treated with: Insulin Blood sugar tested every day: No Immunological Medical History: Negative for: Lupus Erythematosus; Raynauds; Scleroderma Integumentary (Skin) Medical History: Negative for: History of Burn Musculoskeletal Medical History: Negative for: Gout; Rheumatoid  Arthritis; Osteoarthritis; Osteomyelitis Neurologic Medical History: Negative for: Dementia;  Neuropathy; Quadriplegia; Paraplegia; Seizure Disorder [name], [name] ([id number]) [id number] Page 20 of 21 Oncologic Medical History: Negative for: Received Chemotherapy; Received Radiation Psychiatric Medical History: Negative for: Anorexia/bulimia; Confinement Anxiety Immunizations Pneumococcal Vaccine: Received Pneumococcal Vaccination: No Implantable Devices None Hospitalization / Surgery History Type of Hospitalization/Surgery 11/1-11/06/2018- sepsis foot infection 11/4-11/5 02 sats low respiratory distress Family and Social History Cancer: No; Diabetes: No; Hereditary Spherocytosis: No; Hypertension: No; Kidney Disease: No; Lung Disease: No; Seizures: No; Stroke: No; Thyroid Problems: No; Tuberculosis: No; Never smoker; Marital Status - Single; Alcohol Use: Rarely; Drug Use: No History; Caffeine Use: Never; Financial Concerns: No; Food, Clothing or Shelter Needs: No; Support System Lacking: No; Transportation Concerns: No Electronic Signature(s) Signed: 02/23/2022 9:38:41 AM By: [name] MD FACS Entered By: [name] on 02/23/2022 09:05:52 -------------------------------------------------------------------------------- SuperBill Details Patient Name: Date of Service: [name], [name] 02/23/2022 Medical Record Number: [id number] Patient Account Number: [id number] Date of Birth/Sex: Treating RN: 1985-05-18 (36 y.o. M) Primary Care Provider: [name] Other Clinician: Referring Provider: Treating Provider/Extender: [name] in Treatment: 121 Diagnosis Coding ICD-10 Codes Code Description E11.621 Type 2 diabetes mellitus with foot ulcer L97.528 Non-pressure chronic ulcer of other part of left foot with other specified severity Facility Procedures : CPT4 Code: 88828003 Description: 49179 - DEBRIDE WOUND 1ST 20 SQ CM OR < ICD-10 Diagnosis Description L97.528 Non-pressure chronic ulcer of other  part of left foot with other specified severi Modifier: ty Quantity: 1 : CPT4 Code: 15056979 Description: 48016 - WOUND VAC-50 SQ CM OR LESS Modifier: Quantity: 1 Physician Procedures : CPT4 Code Description Modifier [id number] 70786 - WC PHYS LEVEL 4 - EST PT 25 ICD-10 Diagnosis Description L97.528 Non-pressure chronic ulcer of other part of left foot with other specified severity E11.621 Type 2 diabetes mellitus with foot ulcer  [name], [name] ([id number]) [id number] Quantity: 1 .pdf Page 21 of 21 : [id number] 58832 - WC PHYS DEBR WO ANESTH 20 SQ CM 1 ICD-10 Diagnosis Description L97.528 Non-pressure chronic ulcer of other part of left foot with other specified severity Quantity: Electronic Signature(s) Signed: 02/23/2022 9:07:26 AM By: [name] MD FACS Entered By: [name] on 02/23/2022 09:07:25

## 2022-02-23 NOTE — Progress Notes (Signed)
[name], [name] ([id number]) [id number] Page 1 of 8 Visit Report for 02/23/2022 Arrival Information Details Patient Name: Date of Service: [name] 02/23/2022 8:15 A M Medical Record Number: [id number] Patient Account Number: [id number] Date of Birth/Sex: Treating RN: April 27, 1985 (36 y.o. [name], [name] Primary Care [name]: [name] Other Clinician: Referring [name]: Treating [name]/Extender: [name] in Treatment: 53 Visit Information History Since Last Visit Added or deleted any medications: No Patient Arrived: Ambulatory Any new allergies or adverse reactions: No Arrival Time: 08:17 Had a fall or experienced change in No Accompanied By: fiance activities of daily living that may affect Transfer Assistance: None risk of falls: Patient Identification Verified: Yes Signs or symptoms of abuse/neglect since last visito No Secondary Verification Process Completed: Yes Hospitalized since last visit: No Patient Requires Transmission-Based Precautions: No Implantable device outside of the clinic excluding No Patient Has Alerts: No cellular tissue based products placed in the center since last visit: Has Dressing in Place as Prescribed: Yes Pain Present Now: No Electronic Signature(s) Signed: 02/23/2022 3:43:48 PM By: Baruch Gouty RN, BSN Entered By: Baruch Gouty on 02/23/2022 08:29:45 -------------------------------------------------------------------------------- Encounter Discharge Information Details Patient Name: Date of Service: [name], [name] 02/23/2022 8:15 A M Medical Record Number: [id number] Patient Account Number: [id number] Date of Birth/Sex: Treating RN: Nov 12, 1985 (36 y.o. [name] Primary Care Sahmya Arai: [name] Other Clinician: Referring Forest Pruden: Treating Cap Massi/Extender: [name] in Treatment: 121 Encounter Discharge  Information Items Post Procedure Vitals Discharge Condition: Stable Temperature (F): 98.1 Ambulatory Status: Ambulatory Pulse (bpm): 103 Discharge Destination: Home Respiratory Rate (breaths/min): 18 Transportation: Private Auto Blood Pressure (mmHg): 136/76 Accompanied By: fiance Schedule Follow-up Appointment: Yes Clinical Summary of Care: Patient Declined Electronic Signature(s) Signed: 02/23/2022 3:43:48 PM By: Baruch Gouty RN, BSN Entered By: Baruch Gouty on 02/23/2022 09:14:03 [place], [name] ([id number]) [id number] Page 2 of 8 -------------------------------------------------------------------------------- Lower Extremity Assessment Details Patient Name: Date of Service: [name] 02/23/2022 8:15 A M Medical Record Number: [id number] Patient Account Number: [id number] Date of Birth/Sex: Treating RN: Feb 24, 1986 (36 y.o. [name] Primary Care Laquinn Shippy: [name] Other Clinician: Referring Angline Schweigert: Treating Abie Cheek/Extender: [name] in Treatment: 121 Edema Assessment Assessed: [Left: No] [Right: No] Edema: [Left: Ye] [Right: s] Calf Left: Right: Point of Measurement: 48 cm From Medial Instep 49 cm Ankle Left: Right: Point of Measurement: 11 cm From Medial Instep 29.3 cm Vascular Assessment Pulses: Dorsalis Pedis Palpable: [Left:Yes] Electronic Signature(s) Signed: 02/23/2022 3:43:48 PM By: Baruch Gouty RN, BSN Entered By: Baruch Gouty on 02/23/2022 08:36:28 -------------------------------------------------------------------------------- Multi Wound Chart Details Patient Name: Date of Service: [name], [name] 02/23/2022 8:15 A M Medical Record Number: [id number] Patient Account Number: [id number] Date of Birth/Sex: Treating RN: 09/29/1985 (36 y.o. M) Primary Care Owain Eckerman: [name] Other Clinician: Referring Carneshia Raker: Treating Amour Cutrone/Extender: [name] in Treatment: 121 Vital Signs Height(in): 77 Capillary Blood Glucose(mg/dl): 124 Weight(lbs): 280 Pulse(bpm): 103 Body Mass Index(BMI): 33.2 Blood Pressure(mmHg): 136/76 Temperature(F): 98.1 Respiratory Rate(breaths/min): 18 [3:Photos:] [N/A:N/A] Left, Lateral, Plantar Foot N/A N/A Wound Location: Trauma N/A N/A Wounding Event: Diabetic Wound/Ulcer of the Lower N/A N/A Primary Etiology: Extremity Type II Diabetes N/A N/A Comorbid History: 10/02/2019 N/A N/A Date Acquired: 121 N/A N/A Weeks of Treatment: Open N/A N/A Wound Status: No N/A N/A Wound Recurrence: 3.3x3.1x1.2 N/A N/A Measurements L x W x [name] 8.035 N/A N/A A (cm) : rea 9.642 N/A N/A Volume (  cm) : -387.30% N/A N/A % Reduction in A rea: -5743.60% N/A N/A % Reduction in Volume: 3 Starting Position 1 (o'clock): 6 Ending Position 1 (o'clock): 1.7 Maximum Distance 1 (cm): Yes N/A N/A Undermining: Grade 2 N/A N/A Classification: Medium N/A N/A Exudate A mount: Serosanguineous N/A N/A Exudate Type: red, brown N/A N/A Exudate Color: Distinct, outline attached N/A N/A Wound Margin: Large (67-100%) N/A N/A Granulation A mount: Red N/A N/A Granulation Quality: Small (1-33%) N/A N/A Necrotic A mount: Fat Layer (Subcutaneous Tissue): Yes N/A N/A Exposed Structures: Fascia: No Tendon: No Muscle: No Joint: No Bone: No Small (1-33%) N/A N/A Epithelialization: Debridement - Selective/Open Wound N/A N/A Debridement: Pre-procedure Verification/Time Out 08:55 N/A N/A Taken: Callus N/A N/A Tissue Debrided: Skin/Epidermis N/A N/A Level: 3.3 N/A N/A Debridement A (sq cm): rea Curette N/A N/A Instrument: Minimum N/A N/A Bleeding: Pressure N/A N/A Hemostasis A chieved: 0 N/A N/A Procedural Pain: 0 N/A N/A Post Procedural Pain: Procedure was tolerated well N/A N/A Debridement Treatment Response: 3.3x3.1x1.2 N/A N/A Post Debridement Measurements L x W  x [name] 9.642 N/A N/A Post Debridement Volume: (cm) Callus: Yes N/A N/A Periwound Skin Texture: Maceration: Yes N/A N/A Periwound Skin Moisture: No Abnormalities Noted N/A N/A Periwound Skin Color: No Abnormality N/A N/A Temperature: Debridement N/A N/A Procedures Performed: Negative Pressure Wound Therapy Maintenance (NPWT) Treatment Notes Electronic Signature(s) Signed: 02/23/2022 9:04:04 AM By: [name] MD FACS Entered By: [name] on 02/23/2022 09:04:03 -------------------------------------------------------------------------------- Multi-Disciplinary Care Plan Details Patient Name: Date of Service: [name], [name] 02/23/2022 8:15 A M Medical Record Number: [id number] Patient Account Number: [id number] Date of Birth/Sex: Treating RN: [birth year] (36 y.o. [name] Primary Care Trampus Mcquerry: [name] Other Clinician: Referring Grainger Mccarley: Treating Ami Mally/Extender: [name] in Treatment: [address] [name] ([id number]) [id number] Page 4 of 8 Multidisciplinary Care Plan reviewed with physician Active Inactive Nutrition Nursing Diagnoses: Imbalanced nutrition Potential for alteratiion in Nutrition/Potential for imbalanced nutrition Goals: Patient/caregiver agrees to and verbalizes understanding of need to use nutritional supplements and/or vitamins as prescribed Date Initiated: 10/24/2019 Date Inactivated: 04/06/2020 Target Resolution Date: 04/03/2020 Goal Status: Met Patient/caregiver will maintain therapeutic glucose control Date Initiated: 10/24/2019 Target Resolution Date: 03/16/2022 Goal Status: Active Interventions: Assess HgA1c results as ordered upon admission and as needed Assess patient nutrition upon admission and as needed per policy Provide education on elevated blood sugars and impact on wound healing Provide education on nutrition Treatment Activities: Education  provided on Nutrition : 02/02/2022 Notes: 11/17/20: Glucose control ongoing issue, target date extended. 01/26/21: Glucose management continues. Wound/Skin Impairment Nursing Diagnoses: Impaired tissue integrity Knowledge deficit related to ulceration/compromised skin integrity Goals: Patient/caregiver will verbalize understanding of skin care regimen Date Initiated: 10/24/2019 Target Resolution Date: 03/16/2022 Goal Status: Active Ulcer/skin breakdown will have a volume reduction of 30% by week 4 Date Initiated: 10/24/2019 Date Inactivated: 01/16/2020 Target Resolution Date: 01/10/2020 Unmet Reason: no change in Goal Status: Unmet measurements. Interventions: Assess patient/caregiver ability to obtain necessary supplies Assess patient/caregiver ability to perform ulcer/skin care regimen upon admission and as needed Assess ulceration(s) every visit Provide education on ulcer and skin care Notes: 11/17/20: Wound care regimen continues Electronic Signature(s) Signed: 02/23/2022 3:43:48 PM By: Baruch Gouty RN, BSN Entered By: Baruch Gouty on 02/23/2022 08:42:49 -------------------------------------------------------------------------------- Negative Pressure Wound Therapy Maintenance (NPWT) Details Patient Name: Date of Service: [name] 02/23/2022 8:15 A M Medical Record Number: [id number] Patient Account Number: [id number] Date of Birth/Sex: Treating RN: March 04, 1986 (36 y.o. M)  Baruch Gouty Primary Care Delonda Coley: [name] Other Clinician: Referring Preet Perrier: Treating Fletcher Ostermiller/Extender: [name] in Treatment: 121 NPWT Maintenance Performed for: Wound #3 Left, Lateral, Plantar Foot Performed By: Baruch Gouty, RN [place], [name] ([id number]) [id number] Page 5 of 8 Type: VAC System Coverage Size (sq cm): 10.23 Pressure Type: Constant Pressure Setting: 125 mmHG Drain Type: None Primary Contact: Other  : Sponge/Dressing Type: Foam, Black Date Initiated: 02/11/2022 Dressing Removed: No Quantity of Sponges/Gauze Removed: 1 Canister Changed: No Canister Exudate Volume: 30 Dressing Reapplied: No Quantity of Sponges/Gauze Inserted: 1 Respones T Treatment: o good Days On NPWT : 13 Post Procedure Diagnosis Same as Pre-procedure Electronic Signature(s) Signed: 02/23/2022 3:43:48 PM By: Baruch Gouty RN, BSN Entered By: Baruch Gouty on 02/23/2022 08:59:15 -------------------------------------------------------------------------------- Pain Assessment Details Patient Name: Date of Service: [name], [name] 02/23/2022 8:15 A M Medical Record Number: [id number] Patient Account Number: [id number] Date of Birth/Sex: Treating RN: [birth year] (36 y.o. [name] Primary Care Jaikob Borgwardt: [name] Other Clinician: Referring Imberly Troxler: Treating Shyla Gayheart/Extender: [name] in Treatment: 121 Active Problems Location of Pain Severity and Description of Pain Patient Has Paino No Site Locations Rate the pain. Current Pain Level: 0 Pain Management and Medication Current Pain Management: Electronic Signature(s) Signed: 02/23/2022 3:43:48 PM By: Baruch Gouty RN, BSN Entered By: Baruch Gouty on 02/23/2022 08:31:47 [place], [name] ([id number]) [id number] Page 6 of 8 -------------------------------------------------------------------------------- Patient/Caregiver Education Details Patient Name: Date of Service: [name] 11/22/2023andnbsp8:15 A M Medical Record Number: [id number] Patient Account Number: [id number] Date of Birth/Gender: Treating RN: 1985-04-13 (36 y.o. [name] Primary Care Physician: [name] Other Clinician: Referring Physician: Treating Physician/Extender: [name] in Treatment: 121 Education Assessment Education Provided To: Patient Education  Topics Provided Elevated Blood Sugar/ Impact on Healing: Methods: Explain/Verbal Responses: Reinforcements needed, State content correctly Offloading: Methods: Explain/Verbal Responses: Reinforcements needed, State content correctly Wound/Skin Impairment: Methods: Explain/Verbal Responses: Reinforcements needed, State content correctly Electronic Signature(s) Signed: 02/23/2022 3:43:48 PM By: Baruch Gouty RN, BSN Entered By: Baruch Gouty on 02/23/2022 08:43:20 -------------------------------------------------------------------------------- Wound Assessment Details Patient Name: Date of Service: [name], [name] 02/23/2022 8:15 A M Medical Record Number: [id number] Patient Account Number: [id number] Date of Birth/Sex: Treating RN: [birth year] (36 y.o. [name] Primary Care Johnnie Goynes: [name] Other Clinician: Referring Tod Abrahamsen: Treating Mikaelyn Arthurs/Extender: [name] in Treatment: 121 Wound Status Wound Number: 3 Primary Etiology: Diabetic Wound/Ulcer of the Lower Extremity Wound Location: Left, Lateral, Plantar Foot Wound Status: Open Wounding Event: Trauma Comorbid History: Type II Diabetes Date Acquired: 10/02/2019 Weeks Of Treatment: 121 Clustered Wound: No Photos Lorenz, [name] ([id number]) [id number] Page 7 of 8 Wound Measurements Length: (cm) 3.3 Width: (cm) 3.1 Depth: (cm) 1.2 Area: (cm) 8.035 Volume: (cm) 9.642 % Reduction in Area: -387.3% % Reduction in Volume: -5743.6% Epithelialization: Small (1-33%) Tunneling: No Undermining: Yes Starting Position (o'clock): 3 Ending Position (o'clock): 6 Maximum Distance: (cm) 1.7 Wound Description Classification: Grade 2 Wound Margin: Distinct, outline attached Exudate Amount: Medium Exudate Type: Serosanguineous Exudate Color: red, brown Foul Odor After Cleansing: No Slough/Fibrino No Wound Bed Granulation Amount: Large (67-100%)  Exposed Structure Granulation Quality: Red Fascia Exposed: No Necrotic Amount: Small (1-33%) Fat Layer (Subcutaneous Tissue) Exposed: Yes Necrotic Quality: Adherent Slough Tendon Exposed: No Muscle Exposed: No Joint Exposed: No Bone Exposed: No Periwound Skin Texture Texture Color No Abnormalities Noted: No No Abnormalities Noted: Yes Callus: Yes Temperature / Pain Temperature: No Abnormality Moisture  No Abnormalities Noted: No Maceration: Yes Treatment Notes Wound #3 (Foot) Wound Laterality: Plantar, Left, Lateral Cleanser Soap and Water Discharge Instruction: May shower and wash wound with dial antibacterial soap and water prior to dressing change. Peri-Wound Care Topical Primary Dressing VAC Secondary Dressing Secured With Compression Wrap Kerlix Roll 4.5x3.1 (in/yd) Discharge Instruction: Apply Kerlix and Coban compression as directed. Compression Stockings Add-Ons Electronic Signature(s) [place], [name] ([id number]) [id number] Page 8 of 8 Signed: 02/23/2022 3:43:48 PM By: Baruch Gouty RN, BSN Entered By: Baruch Gouty on 02/23/2022 08:45:18 -------------------------------------------------------------------------------- Vitals Details Patient Name: Date of Service: [name], [name] 02/23/2022 8:15 A M Medical Record Number: [id number] Patient Account Number: [id number] Date of Birth/Sex: Treating RN: 07/28/1985 (36 y.o. [name] Primary Care Morrie Daywalt: [name] Other Clinician: Referring Sederick Jacobsen: Treating Lamonica Trueba/Extender: [name] in Treatment: 121 Vital Signs Time Taken: 08:29 Temperature (F): 98.1 Height (in): 77 Pulse (bpm): 103 Weight (lbs): 280 Respiratory Rate (breaths/min): 18 Body Mass Index (BMI): 33.2 Blood Pressure (mmHg): 136/76 Capillary Blood Glucose (mg/dl): 124 Reference Range: 80 - 120 mg / dl Notes glucose per pt report this am Electronic  Signature(s) Signed: 02/23/2022 3:43:48 PM By: Baruch Gouty RN, BSN Entered By: Baruch Gouty on 02/23/2022 08:31:09

## 2022-03-02 ENCOUNTER — Encounter (HOSPITAL_BASED_OUTPATIENT_CLINIC_OR_DEPARTMENT_OTHER): Payer: BC Managed Care – PPO | Admitting: General Surgery

## 2022-03-02 NOTE — Progress Notes (Signed)
[name], Max ([id number]) [id number] Page 1 of 20 Visit Report for 03/02/2022 Chief Complaint Document Details Patient Name: Date of Service: [name] 03/02/2022 8:15 A M Medical Record Number: [id number] Patient Account Number: [id number] Date of Birth/Sex: Treating RN: 03-24-1986 (36 y.o. M) Primary Care Provider: [name] Other Clinician: Referring Provider: Treating Provider/Extender: [name] in Treatment: Divide from: Patient Chief Complaint 01/11/2019; patient is here for review of a rather substantial wound over the left fifth plantar metatarsal head extending into the lateral part of his foot 10/24/2019; patient returns to clinic with wounds on his bilateral feet with underlying osteomyelitis biopsy-proven Electronic Signature(s) Signed: 03/02/2022 9:07:40 AM By: [name] MD FACS Entered By: [name] on 03/02/2022 09:07:40 -------------------------------------------------------------------------------- HPI Details Patient Name: Date of Service: [name], [name] 03/02/2022 8:15 A M Medical Record Number: [id number] Patient Account Number: [id number] Date of Birth/Sex: Treating RN: 02-19-1986 (36 y.o. 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 He comes in for review of a wound over the plantar fifth metatarsal head extending into the lateral part of the foot. He was followed for this previously by his podiatrist Dr. Cornelius Moras. As the patient tells his story he went to see podiatry first for a swelling he developed on the lateral part of his fifth metatarsal head in May. He states this was "open" by podiatry  and the area closed. He was followed up in June and it was again opened callus removed and it closed promptly. There were plans being made for surgery on the fifth metatarsal head in June however his blood sugar was apparently too high for anesthesia. Apparently the area was debrided and opened again in June and it is never closed since. Looking over the records from podiatry I am really not able to follow this. It was clear when he was first seen it was before 5/14 at that point he already had a wound. By 5/17 the ulcer was resolved. I do not see anything about a procedure. On 5/28 noted to have pre-ulcerative moderate keratosis. X-ray noted 1/5 contracted toe and tailor's bunion and metatarsal deformity. On a visit date on 09/28/2018 the dorsal part of the left foot it healed and resolved. There was concern about swelling in his lower extremity he was sent to the ER.. As far as I can tell he was seen in the ER on 7/12 with an ulcer on his left foot. A DVT rule out of the left leg was negative. I do not think I have complete records from podiatry but I am not able to verify the procedures this patient states he had. He states after the last procedure the wound has never closed although I am not able to follow this in the records I have from podiatry. He has not had a recent x-ray The patient has been using Neosporin on the wound. He is wearing a Darco shoe. He is still very active up on his foot working and exercising. Past medical history; type 2 diabetes ketosis-prone, leg swelling with a negative DVT study in July. Non-smoker ABI in our clinic was 0.85 on the left 10/16; substantial wound on the plantar left fifth met head extending laterally almost to the dorsal fifth MTP. We have been using silver  alginate we gave him a Darco forefoot off loader. An x-ray did not show evidence of osteomyelitis did note soft tissue emphysema which I think was due to gas tracking through an open wound. There is no  doubt in my mind he requires an MRI 10/23; MRI not booked until 3 November at the earliest this is largely due to his glucose sensor in the right arm. We have been using silver alginate. There has been an improvement 10/29; I am still not exactly sure when his MRI is booked for. He says it is the third but it is the 10th in epic. This definitely needs to be done. He is running a low-grade fever today but no other symptoms. No real improvement in the 1 Chipley, Max ([id number]) [id number] Page 2 of 20 02/26/2019 patient presents today for a follow-up visit here in our clinic he is last been seen in the clinic on October 29. Subsequently we were working on getting MRI to evaluate and see what exactly was going on and where we would need to go from the standpoint of whether or not he had osteomyelitis and again what treatments were going be required. Subsequently the patient ended up being admitted to the hospital on 02/07/2019 and was discharged on 02/14/2019. This is a somewhat interesting admission with a discharge diagnosis of pneumonia due to COVID-19 although he was positive for COVID-19 when tested at the urgent care but negative x2 when he was actually in the hospital. With that being said he did have acute respiratory failure with hypoxia and it was noted he also have a left foot ulceration with osteomyelitis. With that being said he did require oxygen for his pneumonia and I level 4 L. He was placed on antivirals and steroids for the COVID-19. He was also transferred to the [facility] at one point. Nonetheless he did subsequently discharged home and since being home has done much better in that regard. The CT angiogram did not show any pulmonary embolism. With regard to the osteomyelitis the patient was placed on vancomycin and Zosyn while in the hospital but has been changed to Augmentin at discharge. It was also recommended that he follow- up with wound  care and podiatry. Podiatry however wanted him to see [name] according to the patient prior to them doing anything further. His hemoglobin A1c was 9.9 as noted in the hospital. Have an MRI of the left foot performed while in the hospital on 02/04/2019. 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 There is an overlying plantar open wound noted an abscess tracking back along the lateral aspect of the fifth metatarsal shaft. There is otherwise diffuse cellulitis and mild fasciitis without findings of polymyositis. The patient did have recently pneumonia secondary to COVID-19 I looked in the chart through epic and it does appear that the patient may need to have an additional x-ray just to ensure everything is cleared and that he has no airspace disease prior to putting him into the [name]. 03/05/2019; patient was readmitted to the clinic last week. He was hospitalized twice for a viral upper respiratory tract infection from 11/1 through 11/4 and then 11/5 through 11/12 ultimately this turned out to be Covid pneumonitis. Although he was discharged on oxygen he is not using it. He says he feels fine. He has no exercise limitation no cough no sputum. His O2 sat in our clinic today was 100% on room  air. 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 He received Vanco and Zosyn in the hospital and then was discharged on 2 weeks of Augmentin. I do not see any relevant cultures. He was supposed to follow-up with infectious disease but I do not see that he has an appointment. 12/8; patient saw Dr. Novella Olive of infectious disease last week. He felt that he had had adequate antibiotic therapy. He did not go to follow-up with Dr. Amalia Hailey of podiatry and I have again talked to him about the pros and cons of this. He does not  want to consider a ray amputation of this time. He is aware of the risks of recurrence, migration etc. He started HBO today and tolerated this well. He can complete the Augmentin that I gave him last week. I have looked over the lab work that Dr. Chana Bode ordered his C-reactive protein was 3.3 and his sedimentation rate was 17. The C-reactive protein is never really been measurably that high in this patient 12/15; not much change in the wound today however he has undermining along the lateral part of the foot again more extensively than last week. He has some rims of epithelialization. We have been using silver alginate. He is undergoing hyperbarics but did not dive today 12/18; in for his obligatory first total contact cast change. Unfortunately there was pus coming from the undermining area around his fifth metatarsal head. This was cultured but will preclude reapplication of a cast. He is seen in conjunction with HBO 12/24; patient had staph lugdunensis in the wound in the undermining area laterally last time. We put him on doxycycline which should have covered this. The wound looks better today. I am going to give him another week of doxycycline before reattempting the total contact cast 12/31; the patient is completing antibiotics. Hemorrhagic debris in the distal part of the wound with some undermining distally. He also had hyper granulation. Extensive debridement with a #5 curette. The infected area that was on the lateral part of the fifth met head is closed over. I do not think he needs any more antibiotics. Patient was seen prior to HBO. Preparations for a total contact cast were made in the cast will be placed post hyperbarics 04/11/19; once again the patient arrives today without complaint. He had been in a cast all week noted that he had heavy drainage this week. This resulted in large raised areas of macerated tissue around the wound 1/14; wound bed looks better slightly smaller. Hydrofera  Blue has been changing himself. He had a heavy drainage last week which caused a lot of maceration around the wound so I took him out of a total contact cast he says the drainage is actually better this week He is seen today in conjunction with HBO 1/21; returns to clinic. He was up in [place] for a day or 2 attending a funeral. He comes back in with the wound larger and with a large area of exposed bone. He had osteomyelitis and septic arthritis of the fifth left metatarsal head while he was in hospital. He received IV antibiotics in the hospital for a prolonged period of time then 3 weeks of Augmentin. Subsequently I gave him 2 weeks of doxycycline for more superficial wound infection. When I saw this last week the wound was smaller the surface of the wound looks satisfactory. 1/28; patient missed hyperbarics today. Bone biopsy  I did last time showed Enterococcus faecalis and Staphylococcus lugdunensis . He has a wide area of exposed bone. We are going to use silver alginate as of today. I had another ethical discussion with the patient. 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 In this situation I think the likelihood of healing this is low. Therefore I have recommended a ray amputation and with the patient's agreement I have referred him to Dr. Doran Durand. The other issue is that his compliance with hyperbarics has been minimal because of his work schedule and given his underlying decision I am going to stop this today READMISSION 10/24/2019 MRI 09/29/2019 left foot IMPRESSION: 1. Apparent skin ulceration inferior and lateral to the 5th metatarsal base with underlying heterogeneous T2 signal and enhancement in the subcutaneous fat. Small peripherally enhancing fluid collections along the plantar and lateral aspects of the 5th metatarsal base suspicious for abscesses. 2. Interval amputation through the mid 5th metatarsal with nonspecific low-level marrow edema  and enhancement. Given the proximity to the adjacent soft tissue inflammatory changes, osteomyelitis cannot be excluded. 3. The additional bones appear unremarkable. MRI 09/29/2019 right foot IMPRESSION: 1. Soft tissue ulceration lateral to the 5th MTP joint. There is low-level T2 hyperintensity within the 4th and 5th metatarsal heads and adjacent proximal phalanges without abnormal T1 signal or cortical destruction. These findings are nonspecific and could be seen with early marrow edema, hyperemia or early osteomyelitis. No evidence of septic joint. 2. Mild tenosynovitis and synovial enhancement associated with the extensor digitorum tendons at the level of the midfoot. 3. Diffuse low-level muscular T2 hyperintensity and enhancement, most consistent with diabetic myopathy. LEFT FOOT BONE Methicillin resistant staphylococcus aureus Staphylococcus lugdunensis MIC MIC CIPROFLOXACIN >=8 RESISTANT Resistant <=0.5 SENSI... Sensitive Houdek, Max ([id number]) [id number] Page 3 of 20 CLINDAMYCIN <=0.25 SENS... Sensitive >=8 RESISTANT Resistant ERYTHROMYCIN >=8 RESISTANT Resistant >=8 RESISTANT Resistant GENTAMICIN <=0.5 SENSI... Sensitive <=0.5 SENSI... Sensitive Inducible Clindamycin NEGATIVE Sensitive NEGATIVE Sensitive OXACILLIN >=4 RESISTANT Resistant 2 SENSITIVE Sensitive RIFAMPIN <=0.5 SENSI... Sensitive <=0.5 SENSI... Sensitive TETRACYCLINE <=1 SENSITIVE Sensitive <=1 SENSITIVE Sensitive TRIMETH/SULFA <=10 SENSIT Sensitive <=10 SENSIT Sensitive ... Marland Kitchen.. VANCOMYCIN 1 SENSITIVE Sensitive <=0.5 SENSI... Sensitive Right foot bone . Component 3 wk ago Specimen Description BONE Special Requests RIGHT 4 METATARSAL SAMPLE B Gram Stain NO WBC SEEN NO ORGANISMS SEEN Culture RARE METHICILLIN RESISTANT STAPHYLOCOCCUS AUREUS NO ANAEROBES ISOLATED Performed at [facility], [place] [address] [place], [place] [address] Report Status 10/08/2019  FINAL Organism ID, Bacteria METHICILLIN RESISTANT STAPHYLOCOCCUS AUREUS Resulting Agency [facility] Susceptibility Methicillin resistant staphylococcus aureus MIC CIPROFLOXACIN >=8 RESISTANT Resistant CLINDAMYCIN <=0.25 SENS... Sensitive ERYTHROMYCIN >=8 RESISTANT Resistant GENTAMICIN <=0.5 SENSI... Sensitive Inducible Clindamycin NEGATIVE Sensitive OXACILLIN >=4 RESISTANT Resistant RIFAMPIN <=0.5 SENSI... Sensitive TETRACYCLINE <=1 SENSITIVE Sensitive TRIMETH/SULFA <=10 SENSIT Sensitive ... VANCOMYCIN 1 SENSITIVE Sensitive This is a patient we had in clinic earlier this year with a wound over his left fifth metatarsal head. 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 Doran Durand on 05/16/2019. He seems to have developed problems with wounds on his bilateral feet in June 2021 although he may have had problems earlier than this. He was in an urgent care with a right foot ulcer on 09/26/2019 and given a course  of doxycycline. This was apparently after having trouble getting into see orthopedics. He was seen by podiatry on 09/28/2019 noted to have bilateral lower extremity ulcers including the left lateral fifth metatarsal base and the right subfifth met head. It was noted that had purulent drainage at that time. He required hospitalization from 6/20 through 7/2. This was because of worsening right foot wounds. He underwent bilateral operative incision and drainage and bone biopsies bilaterally. Culture results are listed above. He has been referred back to clinic by Dr. Jacqualyn Posey of podiatry. He is also followed by Dr. Megan Salon who saw him yesterday. He was discharged from hospital on Zyvox Flagyl and Levaquin and yesterday  changed to doxycycline Flagyl and Levaquin. His inflammatory markers on 6/26 showed a sedimentation rate of 129 and a C-reactive protein of 5. This is improved to 14 and 1.3 respectively. This would indicate improvement. ABIs in our clinic today were 1.23 on the right and 1.20 on the left 11/01/2019 on evaluation today patient appears to be doing fairly well in regard to the wounds on his feet at this point. Fortunately there is no signs of active infection at this time. No fevers, chills, nausea, vomiting, or diarrhea. He currently is seeing infectious disease and still under their care at this point. Subsequently he also has both wounds which she has not been using collagen on as he did not receive that in his packaging he did not call us and let us know that. Apparently that just was missed on the order. Nonetheless we will get that straightened out today. 8/9-Patient returns for bilateral foot wounds, using Prisma with hydrogel moistened dressings, and the wounds appear stable. Patient using surgical shoes, avoiding much pressure or weightbearing as much as possible 8/16; patient has bilateral foot wounds. 1 on the right lateral foot proximally the other is on the left mid lateral foot. Both required debridement of callus and thick skin around the wounds. We have been using silver collagen 8/27; patient has bilateral lateral foot wounds. The area on the left substantially surrounded by callus and dry skin. This was removed from the wound edge. The underlying wound is small. The area on the right measured somewhat smaller today. 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 Unfortunately I did not update his antibiotics during today's visit. 9/10 I reviewed Dr. Hale Bogus last notes he felt he had completed antibiotics his inflammatory markers were reasonably well controlled. He has a small wound on the lateral left foot and a tiny  area on the right which is just above closed. He is using Hydrofera Blue with border foam he has bilateral surgical shoes 9/24; 2 week f/u. doing well. right foot is closed. left foot still undermined. 10/14; right foot remains closed at the fifth met head. The area over the base of the left fifth metatarsal has a small open area but considerable undermining towards the plantar foot. Thick callus skin around this suggests an adequate pressure relief. We have talked about this. He says he is going to go back into his cam boot. I suggested a total contact cast he did not seem enamored with this suggestion 10/26; left foot base of the fifth metatarsal. Same condition as last time. He has skin over the area with an open wound however the skin is not adherent. He went to see Dr. Earleen Newport who did an x-ray and culture of his foot I have not reviewed the x-ray but the  patient was not told anything. He is on doxycycline 11/11; since the patient was last here he was in the emergency room on 10/30 he was concerned about swelling in the left foot. They did not do any cultures or x-rays. They changed his antibiotics to cephalexin. Previous culture showed group B strep. The cephalexin is appropriate as doxycycline has less than predictable coverage. Arrives in clinic today with swelling over this area under the wound. He also has a new wound on the right fifth metatarsal head 11/18; the patient has a difficult wound on the lateral aspect of the left fifth metatarsal head. The wound was almost ballotable last week I opened it slightly expecting to see purulence however there was just bleeding. I cultured this this was negative. X-ray unchanged. We are trying to get an MRI but I am not sure were going to be able to get this through his insurance. He also has an area on the right lateral fifth metatarsal head this looks healthier 12/3; the patient finally got our MRI. Surprisingly this did not show osteomyelitis. I did  show the soft tissue ulceration at the lateral plantar aspect of the fifth metatarsal base with a tiny residual 6 mm abscess overlying the superficial fascia I have tried to culture this area I have not been able to get this to grow anything. Nevertheless the protruding tissue looks aggravated. I suspect we should try to treat the underlying "abscess with broad-spectrum antibiotics. I am going to start him on Levaquin and Flagyl. He has much less edema in his legs and I am going to continue to wrap his legs and see him weekly 12/10. I started Levaquin and Flagyl on him last week. He just picked up the Flagyl apparently there was some delay. The worry is the wound on the left fifth metatarsal base which is substantial and worsening. His foot looks like he inverts at the ankle making this a weightbearing surface. Certainly no improvement in fact I think the measurements of this are somewhat worse. We have been using 12/17; he apparently just got the Levaquin yesterday this is 2 weeks after the fact. He has completed the Flagyl. The area over the left fifth metatarsal base still has protruding granulation tissue although it does not look quite as bad as it did some weeks ago. He has severe bilateral lymphedema although we have not been treating him for wounds on his legs this is definitely going to require compression. There was so much edema in the left I did not wish to put him in a Stanton, Max ([id number]) [id number] Page 4 of 20 total contact cast today. I am going to increase his compression from 3-4 layer. The area on the right lateral fifth met head actually look quite good and superficial. 12/23; patient arrived with callus on the right fifth met head and the substantial hyper granulated callused wound on the base of his fifth metatarsal. He says he is completing his Levaquin in 2 days but I do not think that adds up with what I gave him but I will have to double  check this. We are using Hydrofera Blue on both areas. My plan is to put the left leg in a cast the week after New Year's 04/06/2020; patient's wounds about the same. Right lateral fifth metatarsal head and left lateral foot over the base of the fifth metatarsal. There is undermining on the left lateral foot which I removed before application of total contact cast continuing with Hydrofera Blue  new. Patient tells me he was seen by endocrinology today lab work was done [Dr. Kerr]. Also wondering whether he was referred to cardiology. I went over some lab work from previously does not have chronic renal failure certainly not nephrotic range proteinuria he does have very poorly controlled diabetes but this is not his most updated lab work. Hemoglobin A1c has been over 11 1/10; the patient had a considerable amount of leakage towards mid part of his left foot with macerated skin however the wound surface looks better the area on the right lateral fifth met head is better as well. I am going to change the dressing on the left foot under the total contact cast to silver alginate, continue with Hydrofera Blue on the right. 1/20; patient was in the total contact cast for 10 days. Considerable amount of drainage although the skin around the wound does not look too bad on the left foot. The area on the right fifth metatarsal head is closed. Our nursing staff reports large amount of drainage out of the left lateral foot wound 1/25; continues with copious amounts of drainage described by our intake staff. PCR culture I did last week showed E. coli and Enterococcus faecalis and low quantities. Multiple resistance genes documented including extended spectrum beta lactamase, MRSA, MRSE, quinolone, tetracycline. The wound is not quite as good this week as it was 5 days ago but about the same size 2/3; continues with copious amounts of malodorous drainage per our intake nurse. The PCR culture I did 2 weeks ago showed E.  coli and low quantities of Enterococcus. There were multiple resistance genes detected. I put Neosporin on him last week although this does not seem to have helped. The wound is slightly deeper today. Offloading continues to be an issue here although with the amount of drainage she has a total contact cast is just not going to work 2/10; moderate amount of drainage. Patient reports he cannot get his stocking on over the dressing. I told him we have to do that the nurse gave him suggestions on how to make this work. The wound is on the bottom and lateral part of his left foot. Is cultured predominantly grew low amounts of Enterococcus, E. coli and anaerobes. There were multiple resistance genes detected including extended spectrum beta lactamase, quinolone, tetracycline. I could not think of an easy oral combination to address this so for now I am going to do topical antibiotics provided by [facility] I think the main agents here are vancomycin and an aminoglycoside. We have to be able to give him access to the wounds to get the topical antibiotic on 2/17; moderate amount of drainage this is unchanged. He has his Keystone topical antibiotic against the deep tissue culture organisms. He has been using this and changing the dressing daily. Silver alginate on the wound surface. 2/24; using Keystone antibiotic with silver alginate on the top. He had too much drainage for a total contact cast at one point although I think that is improving and I think in the next week or 2 it might be possible to replace a total contact cast I did not do this today. In general the wound surface looks healthy however he continues to have thick rims of skin and subcutaneous tissue around the wide area of the circumference which I debrided 06/04/2020 upon evaluation today patient appears to be doing well in regard to his wound. I do feel like he is showing signs of improvement. There is little bit  of callus and dead tissue around  the edges of the wound as well as what appears to be a little bit of a sinus tract that is off to the side laterally I would perform debridement to clear that away today. 3/17; left lateral foot. The wound looks about the same as I remember. Not much depth surface looks healthy. No evidence of infection 3/25; left lateral foot. Wound surface looks about the same. Separating epithelium from the circumference. There really is no evidence of infection here however not making progress by my view 3/29; left lateral foot. Surface of the wound again looks reasonably healthy still thick skin and subcutaneous tissue around the wound margins. There is no evidence of infection. One of the concerns being brought up by the nurses has again the amount of drainage vis--vis continued use of a total contact cast 4/5; left lateral foot at roughly the base of the fifth metatarsal. Nice healthy looking granulated tissue with rims of epithelialization. The overall wound measurements are not any better but the tissue looks healthy. The only concern is the amount of drainage although he has no surrounding maceration with what we have been doing recently to absorb fluid and protect his skin. He also has lymphedema. 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 He states that the kerlix/coban under the TCC rolled down and could not get it back up. He has been using an offloading scooter and has somehow hurt his right foot using this device. This happened last week. He states that the side of his right foot developed a blister and opened. The top of his foot also has a few small open wounds he thinks is due to his socks rubbing in his shoes. He has not been using any dressings to the  wound. He denies purulent drainage, fever/chills or erythema to the wounds. 4/22; patient presents for 1 week follow-up. He developed new wounds to the right foot that were evaluated at last clinic visit. He continues to have a total contact cast to the left leg and he reports no issues. He has been using silver collagen to the right foot wounds with no issues. He denies purulent drainage, fever/chills or erythema to the right foot wounds. He has no complaints today 4/25; patient presents for 1 week follow-up. He has a total contact cast of the left leg and reports no issues. He has been using silver alginate to the right foot wound. He denies purulent drainage, fever/chills or erythema to the right foot wounds. 5/2 patient presents for 1 week follow-up. T contact cast on the left. The wound which is on the base of the plantar foot at the base of the fifth metatarsal otal actually looks quite good and dimensions continue to gradually contract. HOWEVER the area on the right lateral fifth metatarsal head is much larger than what I remember from 2 weeks ago. Once more is he has significant levels of hypergranulation. Noteworthy that he had this same hyper granulated response on his wound on the left foot at one point in time. So much so that he I thought there was an underlying fluid collection. Based on this I think this just needs debridement. 5/9; the wound on the left actually continues to  be gradually smaller with a healthy surface. Slight amount of drainage and maceration of the skin around but not too bad. However he has a large wound over the right fifth metatarsal head very much in the same configuration as his left foot wound was initially. I used silver nitrate to address the hyper granulated tissue no mechanical debridement 5/16; area on the left foot did not look as healthy this week deeper thick surrounding macerated skin and subcutaneous tissue. The area on the right foot fifth met head  was about the same The area on the right ankle that we identified last week is completely broken down into an open wound presumably a stocking rubbing issue 5/23; patient has been using a total contact cast to the left side. He has been using silver alginate underneath. He has also been using silver alginate to the right foot wounds. He has no complaints today. He denies any signs of infection. 5/31; the left-sided wound looks some better measure smaller surface granulation looks better. We have been using silver alginate under the total contact cast The large area on his right fifth met head and right dorsal foot look about the same still using silver alginate 6/6; neither side is good as I was hoping although the surface area dimensions are better. A lot of maceration on his left and right foot around the wound edge. Area on the dorsal right foot looks better. He says he was traveling. I am not sure what does the amount of maceration around the plantar wounds may be drainage issues 6/13; in general the wound surfaces look quite good on both sides. Macerated skin and raised edges around the wound required debridement although in general especially on the left the surface area seems improved. The area on the right dorsal ankle is about the same I thought this would not be such a problem to close 6/20; not much change in either wound although the one on the right looks a little better. Both wounds have thick macerated edges to the skin requiring debridements. We have been using silver alginate. The area on the dorsal right ankle is still open I thought this would be closed. 6/28; patient comes in today with a marked deterioration in the right foot wound fifth met head. Wide area of exposed bone this is a drastic change from last time. The area on the left there we have been casting is stagnant. We have been using silver alginate in both wound areas. 7/5; bone culture I did for PCR last time was  positive for Pseudomonas, group B strep, Enterococcus and Staph aureus. There was no suggestion of methicillin [name], Max ([id number]) [id number] Page 5 of 20 resistance or ampicillin resistant genes. This was resistant to tetracycline however He comes into the clinic today with the area over his right plantar fifth metatarsal head which had been doing so well 2 weeks ago completely necrotic feeling bone. I do not know that this is going to be salvageable. The left foot wound is certainly no smaller but it has a better surface and is superficial. 7/8; patient called in this morning to say that his total contact cast was rubbing against his foot. He states he is doing fine overall. He denies signs of infection. 7/12; continued deterioration in the wound over the right fifth metatarsal head crumbling bone. This is not going to be salvageable. The patient agrees and wants to be referred to Dr. Doran Durand which we will attempt to arrange as soon as possible.  I am going to continue him on antibiotics as long as that takes so I will renew those today. The area on the left foot which is the base of the fifth metatarsal continues to look somewhat better. Healthy looking tissue no depth no debridement is necessary here. 7/20; the patient was kindly seen by Dr. Doran Durand of orthopedics on 10/19/2020. 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 Towards this end we have taken him out of the total contact cast on the left we will put him in a wrap with Hydrofera Blue. As I understand things surgery is planned for 7/21 7/27; patient had his surgery last Thursday. 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 Apparently everything went well we did not still disturb that today The area on the left foot actually looks quite good. He has been much less mobile which probably explains this he did not seem to  do well in the total contact cast secondary to drainage and maceration I think. 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 Fortunately there is no evidence of active infection at this time. No fevers, chills, nausea, vomiting, or diarrhea. Overall I think that he is actually doing extremely well. Nonetheless I do believe that he is staying off of this more following the surgery in his right foot that is the reason the left is doing so great. 8/16; left plantar foot wound. This looks smaller than the last time I saw this he is using Hydrofera Blue. The surgical wound on the right foot is being followed by Dr. Doran Durand we did not look at this today. He has surgical shoes on both feet 8/23; left plantar foot wound not as good this week. Surrounding macerated skin and subcutaneous tissue everything looks moist and wet. I do not think he is offloading this adequately. He is using a surgical shoe Apparently the right foot surgical wound is not open although I did not check his foot 8/31; left plantar foot lateral aspect. Much improved this week. He has no maceration. Some improvement in the surface area of the wound but most impressively the depth is come in we are using silver alginate. The patient is a Product/process development scientist. He is asked that we write him a letter so he can go back to work. I have also tried to see if we can write something that will allow him to limit the amount of time that he is on his foot at work. 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 Hopefully the latter can be avoided 9/6; I believe the patient missed an appointment last week. He arrives in today with a wound looking roughly the same certainly no better. Undermining laterally and also inferiorly. We used molecuLight today in training with  the patient's permission.. We are using silver alginate 9/21 wound is measuring bigger this week although this may have to do with the aggressive circumferential debridement last week in response to the blush fluorescence on the MolecuLight. Culture I did last week showed significant MSSA and E. coli. I put him on Augmentin but he has not started it yet. We are also going to send this for compounded antibiotics at [facility]. There is  no evidence of systemic infection 9/29; silver alginate. His Keystone arrived. He is completing Augmentin in 2 days. Offloading in a cam boot. Moderate drainage per our intake staff 10/5; using silver alginate. He has been using his [place]. He has completed his Augmentin. Per our intake nurse still a lot of drainage, far too much to consider a total contact cast. Wound measures about the same. He had the same undermining area that I defined last week from a roughly 11-3. I remove this today 10/12; using silver alginate he is using the [place]. He comes in for a nurse visit hence we are applying [employer] twice a week. Measuring slightly better today and less notable drainage. Extensive debridement of the wound edge last time 10/18; using topical Keystone and silver alginate and a soft cast. Wound measurements about the same. Drainage was through his soft cast. We are changing this twice a week Tuesdays and Friday 10/25; comes in with moderate drainage. Still using Keystone silver alginate and a soft cast. Wound dimensions completely the same.He has a lot of edema in the left leg he has lymphedema. Asking for [name] to consider wrapping him as he cannot get his stocking on over the soft cast 11/2; comes in with moderate to large drainage slightly smaller in terms of width we have been using [place]. His wound looks satisfactory but not much improvement 11/4; patient presents today for obligatory cast change. Has no issues or complaints today. He denies signs of  infection. 11/9; patient traveled this weekend to [place], was on the cast quite a bit. Staining of the cast with black material from his walking boot. Drainage was not quite as bad as we feared. Using silver alginate and Keystone 11/16; we do not have size for cast therefore we have been putting a soft cast on him since the change on Friday. Still a significant amount of drainage necessitating changing twice a week. We have been using the Keystone at cast changes either hard or soft as well as silver alginate Comes in the clinic with things actually looking fairly good improvement in width. He says his offloading is about the same 02/24/2021 upon evaluation today patient actually comes back in and is doing excellent in regard to his foot ulcer this is significantly smaller even compared to the last visit. The soft cast seems to have done extremely well for him which is great news. I do not see any signs of infection minimal debridement will be needed today. 11/30; left lateral foot much improved half a centimeter improvement in surface area. No evidence of infection. He seems to be doing better with the soft cast in the TCC therefore we will continue with this. He comes back in later in the week for a change with the nurses. This is due to drainage 12/6; no improvement in dimensions. Under illumination some debris on the surface we have been using silver alginate, soft cast. If there is anything optimistic here he seems to have have less drainage 12/13. Dimensions are improved both length and width and slightly in depth. Appears to be quite healthy today. Raised edges of this thick skin and callus around the edges however. He is in a soft cast were bringing him back once for a change on Friday. Drainage is better 12/20. Dimensions are improved. He still has raised edges of thick skin and callus around the edges. We are using a soft cast 12/28; comes in today with thick callus around the wound. Using  silver under alginate  under a soft cast. I do not think there is much improvement in any measurement 2023 04/06/2021; patient was put in a total contact cast. Unfortunately not much change in surface area 1/10; not much different still thick callus and skin around the edge in spite of the total contact cast. This was just debrided last week we have been using the [facility] compounded antibiotic and silver alginate under a total contact cast 1/18 the patient's wound on the left side is doing nicely. smaller HOWEVER he comes in today with a wound on the right foot laterally. blister most likely serosangquenous drainage 1/24; the patient continues to do well in terms of the plantar left foot which is continued to contract using silver alginate under the total contact cast Aleshire, Max ([id number]) [id number] Page 6 of 20 HOWEVER the right lateral foot is bigger with denuded skin around the edges. I used pickups and a #15 scalpel to remove this this looks like the remanence of a large blister. Cannot rule out infection. Culture in this area I did last week showed Staphylococcus lugdunensis few colonies. I am going to try to address this with his [employer] antibiotic that is done so well on the left having linezolid and this should cover the staph 2/1; the patient's wound on his left foot which was the original plantar foot wound thick skin and eschar around the edges even in the total contact cast but the wound surface does not look too bad The real problem is on how his right lateral foot at roughly the base of the fifth metatarsal. The wound is completely necrotic more worrisome than that there is swelling around the edges of this. We have been using silver alginate on both wounds and Keystone on the right foot. Unfortunately I think he is going to require systemic antibiotics while we await cultures. He did not get the x-ray done that we ordered last week [lost the  prescription 2/7; disappointingly in the area on the left foot which we are treating with a total contact cast is still not closed although it is much smaller. He continues to have a lot of callus around the wound edge. -Right lateral foot culture I did last week was negative x-ray also negative for osteomyelitis. 2/15: TCC silver alginate on the left and silver alginate on the right lateral. No real improvement in either area 05/26/2021: T oday, the wounds are roughly the same size as at his previous visit, post-debridement. He continues to endorse fairly substantial drainage, particularly on the right. He has been in a total contact cast on the left. There is still some callus surrounding this lesion. On the right, the periwound skin is quite macerated, along with surrounding callus. The center of the right-sided wound also has some dark, densely adherent material, which is very difficult to remove. 06/02/2021: Today, both wounds are slightly smaller. He has been using zinc oxide ointment around the right ulcer and the degree of maceration has improved markedly. There continues to be an area of nonviable tissue in the center of the right sided ulcer. The left-sided wound, which has been in the total contact cast. Appears clean and the degree of callus around it is less than previously. 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 He had to take the stairs and both wounds have increased in size. The left foot, which has been in a total contact cast, has developed a tunnel tracking to the lateral  aspect of his foot. The nonviable tissue in the center of the right-sided ulcer remains recalcitrant to debridement. There is significant undermining surrounding the entirety of the left sided wound. 06/16/2021: The elevator at school has been fixed and the patient has been able to avoid putting as much weight on his wounds over the past week.  We converted the left foot wound into a single lesion today, but despite this, the wound is actually smaller. The base is healthy with limited periwound callus. On the right, the central necrotic area is still present. He continues to be quite macerated around the right sided wound, despite applying barrier cream. This does, however, have the benefit of softening the callus to make it more easily removable. 06/23/2021: Today, the left wound is smaller. The lateral aspect that had opened up previously is now closed. The wound base has a healthy bed of granulation tissue and minimal slough. Unfortunately, on the right, the wound is larger and continues to be fairly macerated. He has also reopened the wound at his right ankle. He thinks this is due to the gait he has adopted secondary to his total contact cast and boot. 06/30/2021: T oday, both wounds are a little bit larger. The lateral aspect on the left has remained closed. He continues to have significant periwound maceration. The culture that I took from the right sided wound grew a population of bacteria that is not covered by his current [facility] antibiotic. The center of the right- sided wound continues to appear necrotic with nonviable fat. It probes deeper today, but does not reach bone. 07/07/2021: The periwound maceration is a little bit less today. The right lateral foot wound has some areas that appear more viable and the necrotic center also looks a little bit better. The wound on the dorsal surface of his right foot near the ankle is contracting and the surface appears healthy. The left plantar wound surface looks healthy, but there is some new undermining on the medial portion. He did get his new Keystone antibiotic and began applying that to the right foot wound on Saturday. 07/14/2021: The intake nurse reported substantial drainage from his wounds, but the periwound skin actually looks better than is typical for him. The wound on the dorsal  surface of his right foot near the ankle is smaller and just has a small open area underneath some dried eschar. The left plantar wound surface looks healthy and there has been no significant accumulation of callus. The right lateral foot wound looks quite a bit better, with the central portion, which typically appears necrotic, looking more viable albeit pale. 07/22/2021: His left foot is extremely macerated today. The wound is about the same size. The wound on the dorsal surface of his right foot near the ankle had closed, but he traumatized it removing the dressing and there is a tiny skin tear in that location. The right lateral foot wound is bigger, but the surface appears healthy. 07/30/2021: The wound on the dorsal surface of his right foot near the ankle is closed. The right lateral foot wound again is a little bit bigger due to some undermining. The periwound skin is in better condition, however. He has been applying zinc oxide. The wound surface is a little bit dry today. On the left, he does not have the substantial maceration that we frequently see. The wound itself is smaller and has a clean surface. 08/06/2021: Both wounds seem to have deteriorated over the past week. The right lateral foot  wound has a dry surface but the periwound is boggy.. Overall wound dimensions are about the same. On the left, the wound is about the same size, but there is more undermining present underneath periwound callus. 08/13/2021: The right sided wound looks about the same, but on the left there has been substantial deterioration. The undermining continues to extend under periwound callus. Once this was removed, substantial extension of the wound was present. There is no odor or purulent drainage but clearly the wounds have broken down. 08/20/2021: The wounds look about the same today. He has been out of his total contact cast and has just been changing the dressings using topical Keystone with PolyMem Ag, Kerlix  and Ace bandages. The wound on the top of his right ankle has reopened but this is quite small. There was a little bit of purulent material that I expressed when examining this wound. 08/24/2021: After the aggressive debridement I performed at his last visit, the wounds actually look a little bit better today. They are smaller with the exception of the wound on the top of his right ankle which is a little bit bigger as some more skin pulled off when he was changing his dressing. We are using topical Keystone with PolyMem Ag Kerlix and Ace bandages. 09/02/2021: There has been really no change to any of his wounds. 09/16/2021: The patient was hospitalized last week with nausea, vomiting, and dehydration. He says that while he was in the hospital, his wounds were not really addressed properly. T oday, both plantar foot wounds are larger and the periwound skin is macerated. The wound on the dorsum of his right foot has a scab on the top. The right foot now has a crater where previously he had had nonviable fat. It looks as though this simply died and fell out. The periwound callus is wet. 09/24/2021: His wounds have deteriorated somewhat since his last visit. The wound on the dorsum of his right foot near his ankle is larger and has more nonviable tissue present. The crater in his right foot is even deeper; I cannot quite palpate or probe to bone but I am sure it is close. The wound on his left plantar foot has an odd boggy area in the center that almost feels as though it has fluid within it. He has run out of his topical Keystone antibiotic. We are using silver alginate on his wounds. 09/29/2021: He has developed a new wound on the dorsum of his left foot near his ankle. He says he thinks his wrapping is rubbing in that site. I would concur with this as the wound on his right ankle is larger. The left foot looks about the same. The right foot has the crater that was present last week. No significant slough  accumulation, but his foot remains quite swollen and warm despite oral antibiotic therapy. 10/08/2021: All of his wounds look about the same as last week. He did not start his oral antibiotics that are prescribed until just a couple of days ago; his [employer] compounded antibiotics formula has been changed and he is awaiting delivery of the new recipe. His MRI that was scheduled for earlier this week was canceled as no prior authorization had been obtained; unfortunately the tech responsible sent an email to my old [employer] email, which I no longer use [place], Max ([id number]) [id number] Page 7 of 20 nor have access to. 10/18/2021: The wounds on his bilateral dorsal feet near the ankles are both improved. They are  smaller and have just some eschar and slough buildup. The left plantar wound has a fair amount of undermining, but the surface is clean. There is some periwound callus accumulation. On the right plantar foot, there is nonviable fat leading to a deep tunnel that tracks towards his dorsal medial foot. There is periwound callus and slough accumulation, as well. His right foot and leg remain swollen as compared to the left. 10/25/2021: The wounds on his bilateral dorsal feet and at the ankles have broken down somewhat. They are little bit larger than last week. The left plantar wound continues to undermine laterally but the surface is clean. The right plantar foot wound shows some decreased depth in the tunnel tracking towards his dorsal medial foot. He has not yet had the Doppler study that I ordered; it sounds like there is some confusion about the scheduling of the procedure. In addition, the MRI was denied and I have taken steps to appeal the denial. 11/24/2021: Since our last visit, Mr. Antrim was admitted to the hospital where an MRI suggested osteomyelitis. He was taken the operating room by podiatry. Bone biopsies were negative for osteomyelitis. They  debrided his wounds and applied myriad matrix. He saw them last week and they removed his staples. He is here today to continue his wound healing process. T oday, both of the dorsal foot/ankle wounds are substantially smaller. There is just a little eschar overlying the left sided wound and some eschar and slough on the right. The right plantar foot ulcer has the healthiest surface of granulation tissue that I have seen to date. A portion of the myriad matrix failed to take and was hanging loose. It appears that myriad morcells were placed into the tunnel closest to the dorsal portion of his foot. These have sloughed off. The left plantar foot ulcer is about the same size, but has a much healthier surface than in the past. Both plantar ulcers have callus and slough accumulation. 12/07/2021: Left dorsal foot/ankle wound is closed. The right dorsal foot/ankle wound is nearly closed and just has a small open area with some eschar and slough. The right plantar foot wound has contracted quite a bit since our last visit. It has a healthy surface with just a little bit of slough accumulation and periwound callus buildup. The left plantar foot wound is about the same size but the surface appears healthy. There is a little slough and periwound callus on this side, as well. 12/15/2021: Both dorsal foot/ankle wounds are closed. The right plantar foot wound is substantially smaller than at our last visit. The tunneling that was present has nearly closed. There is just a little bit of slough buildup. The left plantar foot wound is also a little bit smaller today. The surface is the healthiest that I have ever seen it. Light slough and periwound callus accumulation on this side. 12/22/2021: The dorsal ankle/foot wounds remain closed. The right plantar foot wound continues to contract. There is still a bit of depth at the lateral portion of the wound but the surface has a good granulated appearance. The wound on the left  is about the same size, the central indented portion is still adherent. It also is clean with good granulation tissue present. The periwound skin and callus are a bit macerated, however. 12/29/2021: Both ankle wounds remain closed. The right plantar foot wound is less than half the size that it was last week. There is no depth any longer and the surface has just a  little bit of slough and periwound callus. On the left, the wound is not much smaller, but the surface is healthier with good granulation tissue. There is also a little slough and periwound callus buildup. 01/05/2022: The right plantar foot wound continues to contract and is once again about half the size as it was last week. There is just a little bit of surface slough and periwound callus. On the left, the depth of the wound has decreased and the diameter has started to contract. It is also very clean with just a little slough and biofilm present. 01/12/2022: The right plantar foot wound is smaller again today. There is just some periwound callus and slough accumulation. On the left, there is a fair amount of undermining and the surface is a little boggy, but no other significant change. 01/19/2022: The right plantar wound continues to contract. There is minimal periwound callus accumulation and just a light layer of slough on the surface. On the left, the surface looks better, but there is fairly significant undermining near the 11:00 portion of the wound, aiming towards his great toe. The skin overlying this area is very healthy and has a good fat layer present. No malodor or purulent drainage. 01/26/2022: The right sided wound continues to contract and has just a light layer of slough on the surface. Minimal periwound callus. On the left, he still has substantial undermining and the tissue surface is not as robust as I would like to see. No malodor or purulent drainage, but he did say he had a lot of serous drainage over the  weekend. 02/02/2022: The right foot wound is about a quarter of the size that it was at his last visit. There is just a little slough on the surface and some periwound callus. On the left, the undermining persists and the tissue is still more pale, but he does not seem to have had as much drainage over the last weekend. We are waiting on a wound VAC. 02/09/2022: His right foot has healed. He received the wound VAC, but did not bring it to clinic with him today. The lateral aspect of the left foot wound has come in a little, but he still has substantial undermining on the medial and distal portion of the wound. Still with macerated periwound callus. 02/16/2022: The wound VAC was applied last Friday. T oday, there has been tremendous improvement in the surface of the wound bed. The undermined portion of the wound has come in a bit. There is still some overhanging dead skin. Overall the wound looks much better. 02/23/2022: No significant change in the overall wound dimensions, but the wound surface continues to improve and appears healthier and more robust. There is some periwound callus accumulation and overhanging skin. No concern for infection. 03/02/2022: Although the measurements taken in clinic today were unchanged, the visual appearance of the wound looks like it is smaller and the undermining/tunneling is filling with flesh. There is less periwound tissue maceration than usual. No malodor or purulent drainage. No concern for infection. Electronic Signature(s) Signed: 03/02/2022 9:09:22 AM By: [name] MD FACS Entered By: [name] on 03/02/2022 32:20:25 -------------------------------------------------------------------------------- Physical Exam Details Patient Name: Date of Service: [name], [name] 03/02/2022 8:15 A M Medical Record Number: [id number] Patient Account Number: [id number] Date of Birth/Sex: Treating RN: 11/29/1985 (36 y.o. M) Primary Care Provider: [name] Other Clinician: Referring Provider: Treating Provider/Extender: Wilfrid Lund [place], Max ([id number]) [id number] Page 8 of 20 Weeks in Treatment:  122 Constitutional . . . . No acute distress. Respiratory Normal work of breathing on room air. Notes 03/02/2022: Although the measurements taken in clinic today were unchanged, the visual appearance of the wound looks like it is smaller and the undermining/tunneling is filling with flesh. There is less periwound tissue maceration than usual. No malodor or purulent drainage. No concern for infection. Electronic Signature(s) Signed: 03/02/2022 9:16:58 AM By: [name] MD FACS Entered By: [name] on 03/02/2022 09:16:57 -------------------------------------------------------------------------------- Physician Orders Details Patient Name: Date of Service: [name], [name] 03/02/2022 8:15 A M Medical Record Number: [id number] Patient Account Number: [id number] Date of Birth/Sex: Treating RN: Aug 01, 1985 (36 y.o. 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 - Dr. [name] - Room 1 with [name] Return A Wed 12/6 @ 10:30 am Bathing/ Shower/ Hygiene May shower and wash wound with soap and water. Negative Presssure Wound Therapy Wound #3 Left,Lateral,Plantar Foot Wound Vac to wound continuously at 11m/hg pressure Black Foam Edema Control - Lymphedema / SCD / Other Bilateral Lower Extremities Elevate legs to the level of the heart or above for 30 minutes daily and/or when sitting, a  frequency of: - throughout the day Avoid standing for long periods of time. Exercise regularly Compression stocking or Garment 20-30 mm/Hg pressure to: - to both legs daily Off-Loading Open toe surgical shoe to: - Both feet Additional Orders / Instructions Follow Nutritious Diet Non Wound Condition pply the following to affected area as directed: - continue to apply foam donut or cushion to healed right plantar foot A Wound Treatment Wound #3 - Foot Wound Laterality: Plantar, Left, Lateral Cleanser: Soap and Water 3 x Per Week/30 Days Discharge Instructions: May shower and wash wound with dial antibacterial soap and water prior to dressing change. Ebarb, CMali([id number] Page 9 of 20 Prim Dressing: Promogran Prisma Matrix, 4.34 (sq in) (silver collagen) 3 x Per Week/30 Days ary Discharge Instructions: Moisten collagen with saline or hydrogel Prim Dressing: VAC ary 3 x Per Week/30 Days Compression Wrap: Kerlix Roll 4.5x3.1 (in/yd) (Generic) 3 x Per Week/30 Days Discharge Instructions: Apply Kerlix and Coban compression as directed. Electronic Signature(s) Signed: 03/02/2022 10:52:19 AM By: CFredirick MaudlinMD FACS Entered By: CFredirick Maudlinon 03/02/2022 09:17:09 -------------------------------------------------------------------------------- Problem List Details Patient Name: Date of Service: ALewie [name] [name] 03/02/2022 8:15 A M Medical Record Number: [id number] Account Number: [id number] of Birth/Sex: Treating RN: 604-23-1987(36y.o. M) Primary Care Provider: PSeward CarolOther Clinician: Referring Provider: Treating Provider/Extender: COsborn Cohoin Treatment: 170Active Problems ICD-10 Encounter Code Description Active Date MDM Diagnosis E11.621 Type 2 diabetes mellitus with foot ulcer 10/24/2019 No Yes L97.528 Non-pressure chronic ulcer of other part of left foot with other specified  10/24/2019 No Yes severity Inactive Problems ICD-10 Code Description Active Date Inactive Date L97.518 Non-pressure chronic ulcer of other part of right foot with other specified severity 07/14/2020 07/14/2020 L97.518 Non-pressure chronic ulcer of other part of right foot with other specified severity 10/24/2019 10/24/2019 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, right ankle and foot 10/24/2019 10/24/2019 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 08/10/2020 08/10/2020 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, left  ankle and foot 10/24/2019 10/24/2019 L97.322 Non-pressure chronic ulcer of left ankle with fat layer exposed 09/29/2021 09/29/2021 B95.62 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 10/24/2019 10/24/2019 classified elsewhere Resolved Problems [name], Max ([id number]) [id number] Page 10 of 20 Electronic Signature(s) Signed: 03/02/2022 9:07:27 AM By: [name] MD FACS Entered By: [name] on 03/02/2022 09:07:27 -------------------------------------------------------------------------------- Progress Note Details Patient Name: Date of Service: [name], [name] 03/02/2022 8:15 A M Medical Record Number: [id number] Patient Account Number: [id number] Date of Birth/Sex: Treating RN: 02-04-1986 (36 y.o. 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 He comes in for review of a wound over the plantar fifth metatarsal head extending into the lateral part of the foot. He was followed for this previously by his podiatrist Dr. Cornelius Moras. As the patient tells his story he went to see podiatry first for a swelling he developed on the lateral part of his fifth metatarsal head in May. He states this was "open" by podiatry and the area closed. He was followed up in June and it was again opened callus removed and it closed promptly. There were plans being made for surgery on the fifth metatarsal head in June however his blood sugar was apparently too high for anesthesia. Apparently the area was debrided and opened again in June and it is never closed since. Looking over the records from podiatry I am really not able to follow this. It was clear when he was first seen it was before 5/14 at that point he already had a wound. By 5/17 the ulcer was resolved. I do not see anything about a procedure. On 5/28 noted to have pre-ulcerative moderate keratosis. X-ray noted 1/5 contracted toe and tailor's bunion and metatarsal deformity. On a visit date on 09/28/2018 the dorsal part of the left foot it healed and resolved. There was concern about swelling in his lower extremity he was sent to the ER.. As far as I can tell he was seen in the ER on 7/12 with an ulcer on his left foot. A DVT rule out of the left leg was negative. I do not think I have complete records from podiatry but I am not able to verify the procedures this patient states he had. He states after the last procedure the wound has never closed although I am not able to follow this in the records I have from podiatry. He has not had a recent x-ray The patient has been using Neosporin on the wound. He is wearing a Darco shoe. He is still very active up on his foot working and exercising. Past medical history; type 2 diabetes ketosis-prone, leg swelling with  a negative DVT study in July. Non-smoker ABI in our clinic was 0.85 on the left 10/16; substantial wound on the plantar left fifth met head extending laterally almost to the dorsal fifth MTP. We have been using silver alginate we gave him a Darco forefoot off loader. An x-ray did not show evidence of osteomyelitis did note soft tissue  emphysema which I think was due to gas tracking through an open wound. There is no doubt in my mind he requires an MRI 10/23; MRI not booked until 3 November at the earliest this is largely due to his glucose sensor in the right arm. We have been using silver alginate. There has been an improvement 10/29; I am still not exactly sure when his MRI is booked for. He says it is the third but it is the 10th in epic. This definitely needs to be done. He is running a low-grade fever today but no other symptoms. No real improvement in the 1 02/26/2019 patient presents today for a follow-up visit here in our clinic he is last been seen in the clinic on October 29. Subsequently we were working on getting MRI to evaluate and see what exactly was going on and where we would need to go from the standpoint of whether or not he had osteomyelitis and again what treatments were going be required. Subsequently the patient ended up being admitted to the hospital on 02/07/2019 and was discharged on 02/14/2019. This is a somewhat interesting admission with a discharge diagnosis of pneumonia due to COVID-19 although he was positive for COVID-19 when tested at the urgent care but negative x2 when he was actually in the hospital. With that being said he did have acute respiratory failure with hypoxia and it was noted he also have a left foot ulceration with osteomyelitis. With that being said he did require oxygen for his pneumonia and I level 4 L. He was placed on antivirals and steroids for the COVID-19. He was also transferred to the [facility] at one point. Nonetheless he did  subsequently discharged home and since being home has done much better in that regard. The CT angiogram did not show any pulmonary embolism. With regard to the osteomyelitis the patient was placed on vancomycin and Zosyn while in the hospital but has been changed to Augmentin at discharge. It was also recommended that he follow- up with wound care and podiatry. Podiatry however wanted him to see [name] according to the patient prior to them doing anything further. His hemoglobin A1c was 9.9 as noted in the hospital. Have an MRI of the left foot performed while in the hospital on 02/04/2019. 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 There is an overlying plantar open wound noted an abscess tracking back along the lateral aspect of the fifth metatarsal shaft. There is otherwise diffuse cellulitis and mild fasciitis without findings of polymyositis. The patient did have recently pneumonia secondary to COVID-19 I looked in the chart through epic and it does appear that the patient may need to have an additional x-ray just to ensure everything is cleared and that he has no airspace disease prior to putting him into the [name]. 03/05/2019; patient was readmitted to the clinic last week. He was hospitalized twice for a viral upper respiratory tract infection from 11/1 through 11/4 and then 11/5 through 11/12 ultimately this turned out to be Covid pneumonitis. Although he was discharged on oxygen he is not using it. He says he feels fine. He has no exercise limitation no cough no sputum. His O2 sat in our clinic today was 100% on room air. 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 He received Vanco  and Zosyn in the hospital and then was discharged on 2 weeks of Augmentin. I do not see any relevant  cultures. He was supposed to follow-up with infectious disease but I do not see that he has an appointment. 12/8; patient saw Dr. Novella Olive of infectious disease last week. He felt that he had had adequate antibiotic therapy. He did not go to follow-up with Dr. Amalia Hailey of [place], Max ([id number]) [id number] Page 11 of 20 podiatry and I have again talked to him about the pros and cons of this. He does not want to consider a ray amputation of this time. He is aware of the risks of recurrence, migration etc. He started HBO today and tolerated this well. He can complete the Augmentin that I gave him last week. I have looked over the lab work that Dr. Chana Bode ordered his C-reactive protein was 3.3 and his sedimentation rate was 17. The C-reactive protein is never really been measurably that high in this patient 12/15; not much change in the wound today however he has undermining along the lateral part of the foot again more extensively than last week. He has some rims of epithelialization. We have been using silver alginate. He is undergoing hyperbarics but did not dive today 12/18; in for his obligatory first total contact cast change. Unfortunately there was pus coming from the undermining area around his fifth metatarsal head. This was cultured but will preclude reapplication of a cast. He is seen in conjunction with HBO 12/24; patient had staph lugdunensis in the wound in the undermining area laterally last time. We put him on doxycycline which should have covered this. The wound looks better today. I am going to give him another week of doxycycline before reattempting the total contact cast 12/31; the patient is completing antibiotics. Hemorrhagic debris in the distal part of the wound with some undermining distally. He also had hyper granulation. Extensive debridement with a #5 curette. The infected area that was on the lateral part of the fifth met head is closed over. I  do not think he needs any more antibiotics. Patient was seen prior to HBO. Preparations for a total contact cast were made in the cast will be placed post hyperbarics 04/11/19; once again the patient arrives today without complaint. He had been in a cast all week noted that he had heavy drainage this week. This resulted in large raised areas of macerated tissue around the wound 1/14; wound bed looks better slightly smaller. Hydrofera Blue has been changing himself. He had a heavy drainage last week which caused a lot of maceration around the wound so I took him out of a total contact cast he says the drainage is actually better this week He is seen today in conjunction with HBO 1/21; returns to clinic. He was up in [place] for a day or 2 attending a funeral. He comes back in with the wound larger and with a large area of exposed bone. He had osteomyelitis and septic arthritis of the fifth left metatarsal head while he was in hospital. He received IV antibiotics in the hospital for a prolonged period of time then 3 weeks of Augmentin. Subsequently I gave him 2 weeks of doxycycline for more superficial wound infection. When I saw this last week the wound was smaller the surface of the wound looks satisfactory. 1/28; patient missed hyperbarics today. Bone biopsy I did last time showed Enterococcus faecalis and Staphylococcus lugdunensis . He has a wide area of exposed bone. We are going  to use silver alginate as of today. I had another ethical discussion with the patient. 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 In this situation I think the likelihood of healing this is low. Therefore I have recommended a ray amputation and with the patient's agreement I have referred him to Dr. Doran Durand. The other issue is that his compliance with hyperbarics has been minimal because of his work schedule and given his underlying decision I am going to stop this  today READMISSION 10/24/2019 MRI 09/29/2019 left foot IMPRESSION: 1. Apparent skin ulceration inferior and lateral to the 5th metatarsal base with underlying heterogeneous T2 signal and enhancement in the subcutaneous fat. Small peripherally enhancing fluid collections along the plantar and lateral aspects of the 5th metatarsal base suspicious for abscesses. 2. Interval amputation through the mid 5th metatarsal with nonspecific low-level marrow edema and enhancement. Given the proximity to the adjacent soft tissue inflammatory changes, osteomyelitis cannot be excluded. 3. The additional bones appear unremarkable. MRI 09/29/2019 right foot IMPRESSION: 1. Soft tissue ulceration lateral to the 5th MTP joint. There is low-level T2 hyperintensity within the 4th and 5th metatarsal heads and adjacent proximal phalanges without abnormal T1 signal or cortical destruction. These findings are nonspecific and could be seen with early marrow edema, hyperemia or early osteomyelitis. No evidence of septic joint. 2. Mild tenosynovitis and synovial enhancement associated with the extensor digitorum tendons at the level of the midfoot. 3. Diffuse low-level muscular T2 hyperintensity and enhancement, most consistent with diabetic myopathy. LEFT FOOT BONE Methicillin resistant staphylococcus aureus Staphylococcus lugdunensis MIC MIC CIPROFLOXACIN >=8 RESISTANT Resistant <=0.5 SENSI... Sensitive CLINDAMYCIN <=0.25 SENS... Sensitive >=8 RESISTANT Resistant ERYTHROMYCIN >=8 RESISTANT Resistant >=8 RESISTANT Resistant GENTAMICIN <=0.5 SENSI... Sensitive <=0.5 SENSI... Sensitive Inducible Clindamycin NEGATIVE Sensitive NEGATIVE Sensitive OXACILLIN >=4 RESISTANT Resistant 2 SENSITIVE Sensitive RIFAMPIN <=0.5 SENSI... Sensitive <=0.5 SENSI... Sensitive TETRACYCLINE <=1 SENSITIVE Sensitive <=1 SENSITIVE Sensitive TRIMETH/SULFA <=10 SENSIT Sensitive <=10 SENSIT Sensitive ... Marland Kitchen.. VANCOMYCIN 1 SENSITIVE  Sensitive <=0.5 SENSI... Sensitive Right foot bone . Component 3 wk ago Specimen Description BONE Special Requests RIGHT 4 METATARSAL SAMPLE B Gram Stain NO WBC SEEN NO ORGANISMS SEEN Culture RARE METHICILLIN RESISTANT STAPHYLOCOCCUS AUREUS NO ANAEROBES ISOLATED Performed at [facility], [place] [address], [place], [place] [address] Report Status 10/08/2019 FINAL Organism ID, Bacteria METHICILLIN RESISTANT STAPHYLOCOCCUS AUREUS Resulting Agency [facility] Susceptibility [place] Neighbors, Max ([id number]) [id number] Page 12 of 20 Methicillin resistant staphylococcus aureus MIC CIPROFLOXACIN >=8 RESISTANT Resistant CLINDAMYCIN <=0.25 SENS... Sensitive ERYTHROMYCIN >=8 RESISTANT Resistant GENTAMICIN <=0.5 SENSI... Sensitive Inducible Clindamycin NEGATIVE Sensitive OXACILLIN >=4 RESISTANT Resistant RIFAMPIN <=0.5 SENSI... Sensitive TETRACYCLINE <=1 SENSITIVE Sensitive TRIMETH/SULFA <=10 SENSIT Sensitive ... VANCOMYCIN 1 SENSITIVE Sensitive This is a patient we had in clinic earlier this year with a wound over his left fifth metatarsal head. 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 Doran Durand on 05/16/2019. He seems to have developed problems with wounds on his bilateral feet in June 2021 although he may have had problems earlier than this. He was in an urgent care with a right foot ulcer on 09/26/2019 and given a course of doxycycline. This was apparently after having trouble getting into see orthopedics. He was seen by podiatry on 09/28/2019 noted to have  bilateral lower extremity ulcers including the left lateral fifth metatarsal base and the right subfifth met head. It was noted that  had purulent drainage at that time. He required hospitalization from 6/20 through 7/2. This was because of worsening right foot wounds. He underwent bilateral operative incision and drainage and bone biopsies bilaterally. Culture results are listed above. He has been referred back to clinic by Dr. Jacqualyn Posey of podiatry. He is also followed by Dr. Megan Salon who saw him yesterday. He was discharged from hospital on Zyvox Flagyl and Levaquin and yesterday changed to doxycycline Flagyl and Levaquin. His inflammatory markers on 6/26 showed a sedimentation rate of 129 and a C-reactive protein of 5. This is improved to 14 and 1.3 respectively. This would indicate improvement. ABIs in our clinic today were 1.23 on the right and 1.20 on the left 11/01/2019 on evaluation today patient appears to be doing fairly well in regard to the wounds on his feet at this point. Fortunately there is no signs of active infection at this time. No fevers, chills, nausea, vomiting, or diarrhea. He currently is seeing infectious disease and still under their care at this point. Subsequently he also has both wounds which she has not been using collagen on as he did not receive that in his packaging he did not call us and let us know that. Apparently that just was missed on the order. Nonetheless we will get that straightened out today. 8/9-Patient returns for bilateral foot wounds, using Prisma with hydrogel moistened dressings, and the wounds appear stable. Patient using surgical shoes, avoiding much pressure or weightbearing as much as possible 8/16; patient has bilateral foot wounds. 1 on the right lateral foot proximally the other is on the left mid lateral foot. Both required debridement of callus and thick skin around the wounds. We have been using silver collagen 8/27; patient has bilateral lateral foot wounds. The area on the left substantially surrounded by callus and dry skin. This was removed from the wound edge. The  underlying wound is small. The area on the right measured somewhat smaller today. 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 Unfortunately I did not update his antibiotics during today's visit. 9/10 I reviewed Dr. Hale Bogus last notes he felt he had completed antibiotics his inflammatory markers were reasonably well controlled. He has a small wound on the lateral left foot and a tiny area on the right which is just above closed. He is using Hydrofera Blue with border foam he has bilateral surgical shoes 9/24; 2 week f/u. doing well. right foot is closed. left foot still undermined. 10/14; right foot remains closed at the fifth met head. The area over the base of the left fifth metatarsal has a small open area but considerable undermining towards the plantar foot. Thick callus skin around this suggests an adequate pressure relief. We have talked about this. He says he is going to go back into his cam boot. I suggested a total contact cast he did not seem enamored with this suggestion 10/26; left foot base of the fifth metatarsal. Same condition as last time. He has skin over the area with an open wound however the skin is not adherent. He went to see Dr. Earleen Newport who did an x-ray and culture of his foot I have not reviewed the x-ray but the patient was not told anything. He is on doxycycline 11/11; since the patient was last here he was in the emergency room  on 10/30 he was concerned about swelling in the left foot. They did not do any cultures or x-rays. They changed his antibiotics to cephalexin. Previous culture showed group B strep. The cephalexin is appropriate as doxycycline has less than predictable coverage. Arrives in clinic today with swelling over this area under the wound. He also has a new wound on the right fifth metatarsal head 11/18; the patient has a difficult wound on the lateral aspect of the left fifth metatarsal head.  The wound was almost ballotable last week I opened it slightly expecting to see purulence however there was just bleeding. I cultured this this was negative. X-ray unchanged. We are trying to get an MRI but I am not sure were going to be able to get this through his insurance. He also has an area on the right lateral fifth metatarsal head this looks healthier 12/3; the patient finally got our MRI. Surprisingly this did not show osteomyelitis. I did show the soft tissue ulceration at the lateral plantar aspect of the fifth metatarsal base with a tiny residual 6 mm abscess overlying the superficial fascia I have tried to culture this area I have not been able to get this to grow anything. Nevertheless the protruding tissue looks aggravated. I suspect we should try to treat the underlying "abscess with broad-spectrum antibiotics. I am going to start him on Levaquin and Flagyl. He has much less edema in his legs and I am going to continue to wrap his legs and see him weekly 12/10. I started Levaquin and Flagyl on him last week. He just picked up the Flagyl apparently there was some delay. The worry is the wound on the left fifth metatarsal base which is substantial and worsening. His foot looks like he inverts at the ankle making this a weightbearing surface. Certainly no improvement in fact I think the measurements of this are somewhat worse. We have been using 12/17; he apparently just got the Levaquin yesterday this is 2 weeks after the fact. He has completed the Flagyl. The area over the left fifth metatarsal base still has protruding granulation tissue although it does not look quite as bad as it did some weeks ago. He has severe bilateral lymphedema although we have not been treating him for wounds on his legs this is definitely going to require compression. There was so much edema in the left I did not wish to put him in a total contact cast today. I am going to increase his compression from 3-4  layer. The area on the right lateral fifth met head actually look quite good and superficial. 12/23; patient arrived with callus on the right fifth met head and the substantial hyper granulated callused wound on the base of his fifth metatarsal. He says he is completing his Levaquin in 2 days but I do not think that adds up with what I gave him but I will have to double check this. We are using Hydrofera Blue on both areas. My plan is to put the left leg in a cast the week after New Year's 04/06/2020; patient's wounds about the same. Right lateral fifth metatarsal head and left lateral foot over the base of the fifth metatarsal. There is undermining on the left lateral foot which I removed before application of total contact cast continuing with Hydrofera Blue new. Patient tells me he was seen by endocrinology today lab work was done [Dr. Kerr]. Also wondering whether he was referred to cardiology. I went over some lab work  from previously does not have chronic renal failure certainly not nephrotic range proteinuria he does have very poorly controlled diabetes but this is not his most updated lab work. Hemoglobin A1c has been over 11 1/10; the patient had a considerable amount of leakage towards mid part of his left foot with macerated skin however the wound surface looks better the area on the right lateral fifth met head is better as well. I am going to change the dressing on the left foot under the total contact cast to silver alginate, continue with Hydrofera Blue on the right. 1/20; patient was in the total contact cast for 10 days. Considerable amount of drainage although the skin around the wound does not look too bad on the left foot. The area on the right fifth metatarsal head is closed. Our nursing staff reports large amount of drainage out of the left lateral foot wound 1/25; continues with copious amounts of drainage described by our intake staff. PCR culture I did last week showed E. coli  and Enterococcus faecalis and low quantities. Multiple resistance genes documented including extended spectrum beta lactamase, MRSA, MRSE, quinolone, tetracycline. The wound is not quite as good this week as it was 5 days ago but about the same size 2/3; continues with copious amounts of malodorous drainage per our intake nurse. The PCR culture I did 2 weeks ago showed E. coli and low quantities of Enterococcus. There were multiple resistance genes detected. I put Neosporin on him last week although this does not seem to have helped. The wound is slightly deeper today. Offloading continues to be an issue here although with the amount of drainage she has a total contact cast is just not going to work 2/10; moderate amount of drainage. Patient reports he cannot get his stocking on over the dressing. I told him we have to do that the nurse gave him Koning, Max ([id number]) [id number] Page 13 of 20 suggestions on how to make this work. The wound is on the bottom and lateral part of his left foot. Is cultured predominantly grew low amounts of Enterococcus, E. coli and anaerobes. There were multiple resistance genes detected including extended spectrum beta lactamase, quinolone, tetracycline. I could not think of an easy oral combination to address this so for now I am going to do topical antibiotics provided by [facility] I think the main agents here are vancomycin and an aminoglycoside. We have to be able to give him access to the wounds to get the topical antibiotic on 2/17; moderate amount of drainage this is unchanged. He has his Keystone topical antibiotic against the deep tissue culture organisms. He has been using this and changing the dressing daily. Silver alginate on the wound surface. 2/24; using Keystone antibiotic with silver alginate on the top. He had too much drainage for a total contact cast at one point although I think that is improving and I think in the  next week or 2 it might be possible to replace a total contact cast I did not do this today. In general the wound surface looks healthy however he continues to have thick rims of skin and subcutaneous tissue around the wide area of the circumference which I debrided 06/04/2020 upon evaluation today patient appears to be doing well in regard to his wound. I do feel like he is showing signs of improvement. There is little bit of callus and dead tissue around the edges of the wound as well as what appears to be a little bit  of a sinus tract that is off to the side laterally I would perform debridement to clear that away today. 3/17; left lateral foot. The wound looks about the same as I remember. Not much depth surface looks healthy. No evidence of infection 3/25; left lateral foot. Wound surface looks about the same. Separating epithelium from the circumference. There really is no evidence of infection here however not making progress by my view 3/29; left lateral foot. Surface of the wound again looks reasonably healthy still thick skin and subcutaneous tissue around the wound margins. There is no evidence of infection. One of the concerns being brought up by the nurses has again the amount of drainage vis--vis continued use of a total contact cast 4/5; left lateral foot at roughly the base of the fifth metatarsal. Nice healthy looking granulated tissue with rims of epithelialization. The overall wound measurements are not any better but the tissue looks healthy. The only concern is the amount of drainage although he has no surrounding maceration with what we have been doing recently to absorb fluid and protect his skin. He also has lymphedema. 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 He states that the kerlix/coban under the TCC rolled down and could not get it back up. He has been using an offloading scooter and has somehow hurt his right foot using this device. This happened last week. He states that the side of his right foot developed a blister and opened. The top of his foot also has a few small open wounds he thinks is due to his socks rubbing in his shoes. He has not been using any dressings to the wound. He denies purulent drainage, fever/chills or erythema to the wounds. 4/22; patient presents for 1 week follow-up. He developed new wounds to the right foot that were evaluated at last clinic visit. He continues to have a total contact cast to the left leg and he reports no issues. He has been using silver collagen to the right foot wounds with no issues. He denies purulent drainage, fever/chills or erythema to the right foot wounds. He has no complaints today 4/25; patient presents for 1 week follow-up. He has a total contact cast of the left leg and reports no issues. He has been using silver alginate to the right foot wound. He denies purulent drainage, fever/chills or erythema to the right foot wounds. 5/2 patient presents for 1 week follow-up. T contact cast on the left. The wound which is on the base of the plantar foot at the base of the fifth metatarsal otal actually looks quite good and dimensions continue to gradually contract. HOWEVER the area on the right lateral fifth metatarsal head is much larger than what I remember from 2 weeks ago. Once more is he has significant levels of hypergranulation. Noteworthy that he had this same hyper granulated response on his wound on the left foot at one point in time. So much so that he I thought there was an underlying fluid collection. Based on this I think this just needs debridement. 5/9; the wound on the left actually continues to be gradually smaller with a healthy surface. Slight amount  of drainage and maceration of the skin around but not too bad.  However he has a large wound over the right fifth metatarsal head very much in the same configuration as his left foot wound was initially. I used silver nitrate to address the hyper granulated tissue no mechanical debridement 5/16; area on the left foot did not look as healthy this week deeper thick surrounding macerated skin and subcutaneous tissue. oo The area on the right foot fifth met head was about the same oo The area on the right ankle that we identified last week is completely broken down into an open wound presumably a stocking rubbing issue 5/23; patient has been using a total contact cast to the left side. He has been using silver alginate underneath. He has also been using silver alginate to the right foot wounds. He has no complaints today. He denies any signs of infection. 5/31; the left-sided wound looks some better measure smaller surface granulation looks better. We have been using silver alginate under the total contact cast oo The large area on his right fifth met head and right dorsal foot look about the same still using silver alginate 6/6; neither side is good as I was hoping although the surface area dimensions are better. A lot of maceration on his left and right foot around the wound edge. Area on the dorsal right foot looks better. He says he was traveling. I am not sure what does the amount of maceration around the plantar wounds may be drainage issues 6/13; in general the wound surfaces look quite good on both sides. Macerated skin and raised edges around the wound required debridement although in general especially on the left the surface area seems improved. oo The area on the right dorsal ankle is about the same I thought this would not be such a problem to close 6/20; not much change in either wound although the one on the right looks a little better. Both wounds have thick macerated edges to the  skin requiring debridements. We have been using silver alginate. The area on the dorsal right ankle is still open I thought this would be closed. 6/28; patient comes in today with a marked deterioration in the right foot wound fifth met head. Wide area of exposed bone this is a drastic change from last time. The area on the left there we have been casting is stagnant. We have been using silver alginate in both wound areas. 7/5; bone culture I did for PCR last time was positive for Pseudomonas, group B strep, Enterococcus and Staph aureus. There was no suggestion of methicillin resistance or ampicillin resistant genes. This was resistant to tetracycline however He comes into the clinic today with the area over his right plantar fifth metatarsal head which had been doing so well 2 weeks ago completely necrotic feeling bone. I do not know that this is going to be salvageable. The left foot wound is certainly no smaller but it has a better surface and is superficial. 7/8; patient called in this morning to say that his total contact cast was rubbing against his foot. He states he is doing fine overall. He denies signs of infection. 7/12; continued deterioration in the wound over the right fifth metatarsal head crumbling bone. This is not going to be salvageable. The patient agrees and wants to be referred to Dr. Doran Durand which we will attempt to arrange as soon as possible. I am going to continue him on antibiotics as long as that takes so I will renew those today. The area on the left foot which  is the base of the fifth metatarsal continues to look somewhat better. Healthy looking tissue no depth no debridement is necessary here. 7/20; the patient was kindly seen by Dr. Doran Durand of orthopedics on 10/19/2020. 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 Towards this end we have taken him out of the total contact cast  on the left we will put him in a wrap with Hydrofera Blue. As I understand things surgery is planned for 7/21 7/27; patient had his surgery last Thursday. 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 Apparently everything went well we did not still disturb that today The area on the left foot actually looks quite good. He has been much less mobile which probably explains this he did not seem to do well in the total contact cast secondary to drainage and maceration I think. 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 Fortunately there is no evidence of active infection at this time. No fevers, chills, nausea, vomiting, or diarrhea. Overall I think that he is actually doing extremely well. Nonetheless I do believe that he [name], Max ([id number]) [id number] Page 14 of 20 is staying off of this more following the surgery in his right foot that is the reason the left is doing so great. 8/16; left plantar foot wound. This looks smaller than the last time I saw this he is using Hydrofera Blue. The surgical wound on the right foot is being followed by Dr. Doran Durand we did not look at this today. He has surgical shoes on both feet 8/23; left plantar foot wound not as good this week. Surrounding macerated skin and subcutaneous tissue everything looks moist and wet. I do not think he is offloading this adequately. He is using a surgical shoe Apparently the right foot surgical wound is not open although I did not check his foot 8/31; left plantar foot lateral aspect. Much improved this week. He has no maceration. Some improvement in the surface area of the wound but most impressively the depth is come in we are using silver alginate. The patient is a Product/process development scientist. He is asked that we write him a letter so he can go back to work. I have also tried to see if we can write  something that will allow him to limit the amount of time that he is on his foot at work. 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 Hopefully the latter can be avoided 9/6; I believe the patient missed an appointment last week. He arrives in today with a wound looking roughly the same certainly no better. Undermining laterally and also inferiorly. We used molecuLight today in training with the patient's permission.. We are using silver alginate 9/21 wound is measuring bigger this week although this may have to do with the aggressive circumferential debridement last week in response to the blush fluorescence on the MolecuLight. Culture I did last week showed significant MSSA and E. coli. I put him on Augmentin but he has not started it yet. We are also going to send this for compounded antibiotics at [facility]. There is no evidence of systemic infection 9/29; silver alginate. His Keystone arrived. He is completing Augmentin in 2 days.  Offloading in a cam boot. Moderate drainage per our intake staff 10/5; using silver alginate. He has been using his [place]. He has completed his Augmentin. Per our intake nurse still a lot of drainage, far too much to consider a total contact cast. Wound measures about the same. He had the same undermining area that I defined last week from a roughly 11-3. I remove this today 10/12; using silver alginate he is using the [employer]. He comes in for a nurse visit hence we are applying [employer] twice a week. Measuring slightly better today and less notable drainage. Extensive debridement of the wound edge last time 10/18; using topical Keystone and silver alginate and a soft cast. Wound measurements about the same. Drainage was through his soft cast. We are changing this twice a week Tuesdays and Friday 10/25; comes in with moderate drainage. Still using Keystone silver alginate and a soft cast.  Wound dimensions completely the same.He has a lot of edema in the left leg he has lymphedema. Asking for [name] to consider wrapping him as he cannot get his stocking on over the soft cast 11/2; comes in with moderate to large drainage slightly smaller in terms of width we have been using [place]. His wound looks satisfactory but not much improvement 11/4; patient presents today for obligatory cast change. Has no issues or complaints today. He denies signs of infection. 11/9; patient traveled this weekend to [place], was on the cast quite a bit. Staining of the cast with black material from his walking boot. Drainage was not quite as bad as we feared. Using silver alginate and Keystone 11/16; we do not have size for cast therefore we have been putting a soft cast on him since the change on Friday. Still a significant amount of drainage necessitating changing twice a week. We have been using the Keystone at cast changes either hard or soft as well as silver alginate Comes in the clinic with things actually looking fairly good improvement in width. He says his offloading is about the same 02/24/2021 upon evaluation today patient actually comes back in and is doing excellent in regard to his foot ulcer this is significantly smaller even compared to the last visit. The soft cast seems to have done extremely well for him which is great news. I do not see any signs of infection minimal debridement will be needed today. 11/30; left lateral foot much improved half a centimeter improvement in surface area. No evidence of infection. He seems to be doing better with the soft cast in the TCC therefore we will continue with this. He comes back in later in the week for a change with the nurses. This is due to drainage 12/6; no improvement in dimensions. Under illumination some debris on the surface we have been using silver alginate, soft cast. If there is anything optimistic here he seems to have have less  drainage 12/13. Dimensions are improved both length and width and slightly in depth. Appears to be quite healthy today. Raised edges of this thick skin and callus around the edges however. He is in a soft cast were bringing him back once for a change on Friday. Drainage is better 12/20. Dimensions are improved. He still has raised edges of thick skin and callus around the edges. We are using a soft cast 12/28; comes in today with thick callus around the wound. Using silver under alginate under a soft cast. I do not think there is much improvement in any measurement 2023 04/06/2021; patient  was put in a total contact cast. Unfortunately not much change in surface area 1/10; not much different still thick callus and skin around the edge in spite of the total contact cast. This was just debrided last week we have been using the [facility] compounded antibiotic and silver alginate under a total contact cast 1/18 the patient's wound on the left side is doing nicely. smaller HOWEVER he comes in today with a wound on the right foot laterally. blister most likely serosangquenous drainage 1/24; the patient continues to do well in terms of the plantar left foot which is continued to contract using silver alginate under the total contact cast HOWEVER the right lateral foot is bigger with denuded skin around the edges. I used pickups and a #15 scalpel to remove this this looks like the remanence of a large blister. Cannot rule out infection. Culture in this area I did last week showed Staphylococcus lugdunensis few colonies. I am going to try to address this with his [employer] antibiotic that is done so well on the left having linezolid and this should cover the staph 2/1; the patient's wound on his left foot which was the original plantar foot wound thick skin and eschar around the edges even in the total contact cast but the wound surface does not look too bad The real problem is on how his right lateral foot at  roughly the base of the fifth metatarsal. The wound is completely necrotic more worrisome than that there is swelling around the edges of this. We have been using silver alginate on both wounds and Keystone on the right foot. Unfortunately I think he is going to require systemic antibiotics while we await cultures. He did not get the x-ray done that we ordered last week [lost the prescription 2/7; disappointingly in the area on the left foot which we are treating with a total contact cast is still not closed although it is much smaller. He continues to have a lot of callus around the wound edge. -Right lateral foot culture I did last week was negative x-ray also negative for osteomyelitis. 2/15: TCC silver alginate on the left and silver alginate on the right lateral. No real improvement in either area 05/26/2021: T oday, the wounds are roughly the same size as at his previous visit, post-debridement. He continues to endorse fairly substantial drainage, particularly on the right. He has been in a total contact cast on the left. There is still some callus surrounding this lesion. On the right, the periwound skin is quite macerated, along with surrounding callus. The center of the right-sided wound also has some dark, densely adherent material, which is very difficult to remove. 06/02/2021: Today, both wounds are slightly smaller. He has been using zinc oxide ointment around the right ulcer and the degree of maceration has improved markedly. There continues to be an area of nonviable tissue in the center of the right sided ulcer. The left-sided wound, which has been in the total contact cast. Appears clean and the degree of callus around it is less than previously. 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 He had to take the stairs and both wounds  have increased in size. The left foot, which has been in a total contact cast, has developed a tunnel tracking to the lateral aspect of his foot. The nonviable tissue in the center of the right-sided ulcer remains recalcitrant to  debridement. There is significant undermining surrounding the entirety of the left sided wound. 06/16/2021: The elevator at school has been fixed and the patient has been able to avoid putting as much weight on his wounds over the past week. We converted the left foot wound into a single lesion today, but despite this, the wound is actually smaller. The base is healthy with limited periwound callus. On the right, the central necrotic area is still present. He continues to be quite macerated around the right sided wound, despite applying barrier cream. This does, however, have the benefit of softening the callus to make it more easily removable. 06/23/2021: Today, the left wound is smaller. The lateral aspect that had opened up previously is now closed. The wound base has a healthy bed of granulation tissue and minimal slough. Unfortunately, on the right, the wound is larger and continues to be fairly macerated. He has also reopened the wound at his right ankle. He thinks this is due to the gait he has adopted secondary to his total contact cast and boot. 06/30/2021: T oday, both wounds are a little bit larger. The lateral aspect on the left has remained closed. He continues to have significant periwound maceration. The culture that I took from the right sided wound grew a population of bacteria that is not covered by his current [facility] antibiotic. The center of the right- sided wound continues to appear necrotic with nonviable fat. It probes deeper today, but does not reach bone. 07/07/2021: The periwound maceration is a little bit less today. The right lateral foot wound has some areas that appear more viable and the necrotic center also looks a little bit better. The wound on  the dorsal surface of his right foot near the ankle is contracting and the surface appears healthy. The left plantar wound surface looks healthy, but there is some new undermining on the medial portion. He did get his new Keystone antibiotic and began applying that to the right foot wound on Saturday. 07/14/2021: The intake nurse reported substantial drainage from his wounds, but the periwound skin actually looks better than is typical for him. The wound on the dorsal surface of his right foot near the ankle is smaller and just has a small open area underneath some dried eschar. The left plantar wound surface looks healthy and there has been no significant accumulation of callus. The right lateral foot wound looks quite a bit better, with the central portion, which typically appears necrotic, looking more viable albeit pale. 07/22/2021: His left foot is extremely macerated today. The wound is about the same size. The wound on the dorsal surface of his right foot near the ankle had closed, but he traumatized it removing the dressing and there is a tiny skin tear in that location. The right lateral foot wound is bigger, but the surface appears healthy. 07/30/2021: The wound on the dorsal surface of his right foot near the ankle is closed. The right lateral foot wound again is a little bit bigger due to some undermining. The periwound skin is in better condition, however. He has been applying zinc oxide. The wound surface is a little bit dry today. On the left, he does not have the substantial maceration that we frequently see. The wound itself is smaller and has a clean surface. 08/06/2021: Both wounds seem to have deteriorated over the past week. The right lateral foot wound has a dry surface but the periwound is boggy.. Overall wound dimensions are about the same. On  the left, the wound is about the same size, but there is more undermining present underneath periwound callus. 08/13/2021: The right sided  wound looks about the same, but on the left there has been substantial deterioration. The undermining continues to extend under periwound callus. Once this was removed, substantial extension of the wound was present. There is no odor or purulent drainage but clearly the wounds have broken down. 08/20/2021: The wounds look about the same today. He has been out of his total contact cast and has just been changing the dressings using topical Keystone with PolyMem Ag, Kerlix and Ace bandages. The wound on the top of his right ankle has reopened but this is quite small. There was a little bit of purulent material that I expressed when examining this wound. 08/24/2021: After the aggressive debridement I performed at his last visit, the wounds actually look a little bit better today. They are smaller with the exception of the wound on the top of his right ankle which is a little bit bigger as some more skin pulled off when he was changing his dressing. We are using topical Keystone with PolyMem Ag Kerlix and Ace bandages. 09/02/2021: There has been really no change to any of his wounds. 09/16/2021: The patient was hospitalized last week with nausea, vomiting, and dehydration. He says that while he was in the hospital, his wounds were not really addressed properly. T oday, both plantar foot wounds are larger and the periwound skin is macerated. The wound on the dorsum of his right foot has a scab on the top. The right foot now has a crater where previously he had had nonviable fat. It looks as though this simply died and fell out. The periwound callus is wet. 09/24/2021: His wounds have deteriorated somewhat since his last visit. The wound on the dorsum of his right foot near his ankle is larger and has more nonviable tissue present. The crater in his right foot is even deeper; I cannot quite palpate or probe to bone but I am sure it is close. The wound on his left plantar foot has an odd boggy area in the center  that almost feels as though it has fluid within it. He has run out of his topical Keystone antibiotic. We are using silver alginate on his wounds. 09/29/2021: He has developed a new wound on the dorsum of his left foot near his ankle. He says he thinks his wrapping is rubbing in that site. I would concur with this as the wound on his right ankle is larger. The left foot looks about the same. The right foot has the crater that was present last week. No significant slough accumulation, but his foot remains quite swollen and warm despite oral antibiotic therapy. 10/08/2021: All of his wounds look about the same as last week. He did not start his oral antibiotics that are prescribed until just a couple of days ago; his [employer] compounded antibiotics formula has been changed and he is awaiting delivery of the new recipe. His MRI that was scheduled for earlier this week was canceled as no prior authorization had been obtained; unfortunately the tech responsible sent an email to my old [place] email, which I no longer use nor have access to. 10/18/2021: The wounds on his bilateral dorsal feet near the ankles are both improved. They are smaller and have just some eschar and slough buildup. The left plantar wound has a fair amount of undermining, but the surface is clean. There is  some periwound callus accumulation. On the right plantar foot, there is nonviable fat leading to a deep tunnel that tracks towards his dorsal medial foot. There is periwound callus and slough accumulation, as well. His right foot and leg remain swollen as compared to the left. 10/25/2021: The wounds on his bilateral dorsal feet and at the ankles have broken down somewhat. They are little bit larger than last week. The left plantar wound continues to undermine laterally but the surface is clean. The right plantar foot wound shows some decreased depth in the tunnel tracking towards his dorsal medial foot. He has not yet had the Doppler  study that I ordered; it sounds like there is some confusion about the scheduling of the procedure. In addition, the MRI was denied and I have taken steps to appeal the denial. 11/24/2021: Since our last visit, Mr. [name] was admitted to the hospital where an MRI suggested osteomyelitis. He was taken the operating room by podiatry. Bone biopsies were negative for osteomyelitis. They debrided his wounds and applied myriad matrix. He saw them last week and they removed his staples. He is here today to continue his wound healing process. T oday, both of the dorsal foot/ankle wounds are substantially smaller. There is just a little eschar overlying the left sided wound and some eschar and slough on the right. The right plantar foot ulcer has the healthiest surface of granulation tissue that I have seen to date. A portion of the myriad matrix failed to take and was hanging loose. It appears that myriad morcells were placed into the tunnel closest to the dorsal portion of his foot. These have sloughed off. The left plantar foot ulcer is about the same size, but has a much healthier surface than in the past. Both plantar ulcers have callus and slough accumulation. 12/07/2021: Left dorsal foot/ankle wound is closed. The right dorsal foot/ankle wound is nearly closed and just has a small open area with some eschar and slough. The right plantar foot wound has contracted quite a bit since our last visit. It has a healthy surface with just a little bit of slough accumulation and periwound callus buildup. The left plantar foot wound is about the same size but the surface appears healthy. There is a little slough and periwound callus on this side, as well. [name], Max ([id number]) [id number] Page 16 of 20 12/15/2021: Both dorsal foot/ankle wounds are closed. The right plantar foot wound is substantially smaller than at our last visit. The tunneling that was present has nearly closed.  There is just a little bit of slough buildup. The left plantar foot wound is also a little bit smaller today. The surface is the healthiest that I have ever seen it. Light slough and periwound callus accumulation on this side. 12/22/2021: The dorsal ankle/foot wounds remain closed. The right plantar foot wound continues to contract. There is still a bit of depth at the lateral portion of the wound but the surface has a good granulated appearance. The wound on the left is about the same size, the central indented portion is still adherent. It also is clean with good granulation tissue present. The periwound skin and callus are a bit macerated, however. 12/29/2021: Both ankle wounds remain closed. The right plantar foot wound is less than half the size that it was last week. There is no depth any longer and the surface has just a little bit of slough and periwound callus. On the left, the wound is not much smaller, but the  surface is healthier with good granulation tissue. There is also a little slough and periwound callus buildup. 01/05/2022: The right plantar foot wound continues to contract and is once again about half the size as it was last week. There is just a little bit of surface slough and periwound callus. On the left, the depth of the wound has decreased and the diameter has started to contract. It is also very clean with just a little slough and biofilm present. 01/12/2022: The right plantar foot wound is smaller again today. There is just some periwound callus and slough accumulation. On the left, there is a fair amount of undermining and the surface is a little boggy, but no other significant change. 01/19/2022: The right plantar wound continues to contract. There is minimal periwound callus accumulation and just a light layer of slough on the surface. On the left, the surface looks better, but there is fairly significant undermining near the 11:00 portion of the wound, aiming towards his  great toe. The skin overlying this area is very healthy and has a good fat layer present. No malodor or purulent drainage. 01/26/2022: The right sided wound continues to contract and has just a light layer of slough on the surface. Minimal periwound callus. On the left, he still has substantial undermining and the tissue surface is not as robust as I would like to see. No malodor or purulent drainage, but he did say he had a lot of serous drainage over the weekend. 02/02/2022: The right foot wound is about a quarter of the size that it was at his last visit. There is just a little slough on the surface and some periwound callus. On the left, the undermining persists and the tissue is still more pale, but he does not seem to have had as much drainage over the last weekend. We are waiting on a wound VAC. 02/09/2022: His right foot has healed. He received the wound VAC, but did not bring it to clinic with him today. The lateral aspect of the left foot wound has come in a little, but he still has substantial undermining on the medial and distal portion of the wound. Still with macerated periwound callus. 02/16/2022: The wound VAC was applied last Friday. T oday, there has been tremendous improvement in the surface of the wound bed. The undermined portion of the wound has come in a bit. There is still some overhanging dead skin. Overall the wound looks much better. 02/23/2022: No significant change in the overall wound dimensions, but the wound surface continues to improve and appears healthier and more robust. There is some periwound callus accumulation and overhanging skin. No concern for infection. 03/02/2022: Although the measurements taken in clinic today were unchanged, the visual appearance of the wound looks like it is smaller and the undermining/tunneling is filling with flesh. There is less periwound tissue maceration than usual. No malodor or purulent drainage. No concern for infection. Patient  History Information obtained from Patient. Family History No family history of Cancer, Diabetes, Hereditary Spherocytosis, Hypertension, Kidney Disease, Lung Disease, Seizures, Stroke, Thyroid Problems, Tuberculosis. Social History Never smoker, Marital Status - Single, Alcohol Use - Rarely, Drug Use - No History, Caffeine Use - Never. Medical History Eyes Denies history of Cataracts, Glaucoma, Optic Neuritis Ear/Nose/Mouth/Throat Denies history of Chronic sinus problems/congestion, Middle ear problems Hematologic/Lymphatic Denies history of Anemia, Hemophilia, Human Immunodeficiency Virus, Lymphedema, Sickle Cell Disease Respiratory Denies history of Aspiration, Asthma, Chronic Obstructive Pulmonary Disease (COPD), Pneumothorax, Sleep Apnea, Tuberculosis Cardiovascular Denies  history of Angina, Arrhythmia, Congestive Heart Failure, Coronary Artery Disease, Deep Vein Thrombosis, Hypertension, Hypotension, Myocardial Infarction, Peripheral Arterial Disease, Peripheral Venous Disease, Phlebitis, Vasculitis Gastrointestinal Denies history of Cirrhosis , Colitis, Crohnoos, Hepatitis A, Hepatitis B, Hepatitis C Endocrine Patient has history of Type II Diabetes Denies history of Type I Diabetes Immunological Denies history of Lupus Erythematosus, Raynaudoos, Scleroderma Integumentary (Skin) Denies history of History of Burn Musculoskeletal Denies history of Gout, Rheumatoid Arthritis, Osteoarthritis, Osteomyelitis Neurologic Denies history of Dementia, Neuropathy, Quadriplegia, Paraplegia, Seizure Disorder Oncologic Denies history of Received Chemotherapy, Received Radiation Psychiatric Denies history of Anorexia/bulimia, Confinement Anxiety Hospitalization/Surgery History - 11/1-11/06/2018- sepsis foot infection. - 11/4-11/5 02 sats low respiratory distress. Neiswonger, Max ([id number]) [id number] Page 17 of 20 Objective Constitutional No acute  distress. Vitals Time Taken: 8:45 AM, Height: 77 in, Weight: 280 lbs, BMI: 33.2, Temperature: 97.8 F, Pulse: 87 bpm, Respiratory Rate: 20 breaths/min, Blood Pressure: 132/89 mmHg, Capillary Blood Glucose: 108 mg/dl. Respiratory Normal work of breathing on room air. General Notes: 03/02/2022: Although the measurements taken in clinic today were unchanged, the visual appearance of the wound looks like it is smaller and the undermining/tunneling is filling with flesh. There is less periwound tissue maceration than usual. No malodor or purulent drainage. No concern for infection. Integumentary (Hair, Skin) Wound #3 status is Open. Original cause of wound was Trauma. The date acquired was: 10/02/2019. The wound has been in treatment 122 weeks. The wound is located on the [place]. The wound measures 3.3cm length x 3cm width x 1.7cm depth; 7.775cm^2 area and 13.218cm^3 volume. There is Fat Layer (Subcutaneous Tissue) exposed. There is no tunneling or undermining noted. There is a medium amount of serosanguineous drainage noted. The wound margin is distinct with the outline attached to the wound base. There is large (67-100%) red granulation within the wound bed. There is a small (1-33%) amount of necrotic tissue within the wound bed including Adherent Slough. The periwound skin appearance had no abnormalities noted for color. The periwound skin appearance exhibited: Callus, Maceration. Periwound temperature was noted as No Abnormality. 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 - Dr. [name] - Room 1 with Delice Lesch 12/6 @ 10:30 am Bathing/ Shower/ Hygiene: May shower and wash wound with soap and water. Negative Presssure Wound Therapy: Wound #3 Left,Lateral,Plantar Foot: Wound Vac to wound continuously at  154m/hg pressure Black Foam Edema Control - Lymphedema / SCD / Other: Elevate legs to the level of the heart or above for 30 minutes daily and/or when sitting, a frequency of: - throughout the day Avoid standing for long periods of time. Exercise regularly Compression stocking or Garment 20-30 mm/Hg pressure to: - to both legs daily Off-Loading: Open toe surgical shoe to: - Both feet Additional Orders / Instructions: Follow Nutritious Diet Non Wound Condition: Apply the following to affected area as directed: - continue to apply foam donut or cushion to healed right plantar foot WOUND #3: - Foot Wound Laterality: Plantar, Left, Lateral Cleanser: Soap and Water 3 x Per Week/30 Days Discharge Instructions: May shower and wash wound with dial antibacterial soap and water prior to dressing change. Prim Dressing: Promogran Prisma Matrix, 4.34 (sq in) (silver collagen) 3 x Per Week/30 Days ary Discharge Instructions: Moisten collagen with saline or hydrogel Prim Dressing: VAC 3 x Per Week/30 Days ary Com pression Wrap: Kerlix Roll 4.5x3.1 (in/yd) (  Generic) 3 x Per Week/30 Days Discharge Instructions: Apply Kerlix and Coban compression as directed. 03/02/2022: Although the measurements taken in clinic today were unchanged, the visual appearance of the wound looks like it is smaller and the undermining/tunneling is filling with flesh. There is less periwound tissue maceration than usual. No malodor or purulent drainage. No concern for infection. No debridement was necessary today. I am going to add some silver collagen to the wound surface underneath the VAC to see if we can encourage more ingrowth of granulation tissue. Follow-up in 1 week. [name], Max ([id number]) [id number] Page 18 of 20 Electronic Signature(s) Signed: 03/02/2022 9:23:57 AM By: [name] MD FACS Entered By: [name] on 03/02/2022  09:23:56 -------------------------------------------------------------------------------- HxROS Details Patient Name: Date of Service: [name], [name] 03/02/2022 8:15 A M Medical Record Number: [id number] Patient Account Number: [id number] Date of Birth/Sex: Treating RN: March 24, 1986 (36 y.o. M) Primary Care Provider: [name] Other Clinician: Referring Provider: Treating Provider/Extender: [name] in Treatment: 48 Information Obtained From Patient Eyes Medical History: Negative for: Cataracts; Glaucoma; Optic Neuritis Ear/Nose/Mouth/Throat Medical History: Negative for: Chronic sinus problems/congestion; Middle ear problems Hematologic/Lymphatic Medical History: Negative for: Anemia; Hemophilia; Human Immunodeficiency Virus; Lymphedema; Sickle Cell Disease Respiratory Medical History: Negative for: Aspiration; Asthma; Chronic Obstructive Pulmonary Disease (COPD); Pneumothorax; Sleep Apnea; Tuberculosis Cardiovascular Medical History: Negative for: Angina; Arrhythmia; Congestive Heart Failure; Coronary Artery Disease; Deep Vein Thrombosis; Hypertension; Hypotension; Myocardial Infarction; Peripheral Arterial Disease; Peripheral Venous Disease; Phlebitis; Vasculitis Gastrointestinal Medical History: Negative for: Cirrhosis ; Colitis; Crohns; Hepatitis A; Hepatitis B; Hepatitis C Endocrine Medical History: Positive for: Type II Diabetes Negative for: Type I Diabetes Time with diabetes: 8 Treated with: Insulin Blood sugar tested every day: No Immunological Medical History: Negative for: Lupus Erythematosus; Raynauds; Scleroderma Integumentary (Skin) Medical History: Negative for: History of Burn Musculoskeletal Medical History: [name], Max ([id number]) [id number] Page 19 of 20 Negative for: Gout; Rheumatoid Arthritis; Osteoarthritis; Osteomyelitis Neurologic Medical History: Negative for: Dementia;  Neuropathy; Quadriplegia; Paraplegia; Seizure Disorder Oncologic Medical History: Negative for: Received Chemotherapy; Received Radiation Psychiatric Medical History: Negative for: Anorexia/bulimia; Confinement Anxiety Immunizations Pneumococcal Vaccine: Received Pneumococcal Vaccination: No Implantable Devices None Hospitalization / Surgery History Type of Hospitalization/Surgery 11/1-11/06/2018- sepsis foot infection 11/4-11/5 02 sats low respiratory distress Family and Social History Cancer: No; Diabetes: No; Hereditary Spherocytosis: No; Hypertension: No; Kidney Disease: No; Lung Disease: No; Seizures: No; Stroke: No; Thyroid Problems: No; Tuberculosis: No; Never smoker; Marital Status - Single; Alcohol Use: Rarely; Drug Use: No History; Caffeine Use: Never; Financial Concerns: No; Food, Clothing or Shelter Needs: No; Support System Lacking: No; Transportation Concerns: No Electronic Signature(s) Signed: 03/02/2022 10:52:19 AM By: [name] MD FACS Entered By: [name] on 03/02/2022 09:09:34 -------------------------------------------------------------------------------- SuperBill Details Patient Name: Date of Service: [name], [name] 03/02/2022 Medical Record Number: [id number] Patient Account Number: [id number] Date of Birth/Sex: Treating RN: [birth year] (36 y.o. 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

## 2022-03-02 NOTE — Progress Notes (Signed)
[name], Max ([id number]) [id number] Page 1 of 7 Visit Report for 03/02/2022 Arrival Information Details Patient Name: Date of Service: [name] 03/02/2022 8:15 A M Medical Record Number: [id number] Patient Account Number: [id number] Date of Birth/Sex: Treating RN: 08-13-1985 (36 y.o. M) Primary Care Morrie Daywalt: [name] Other Clinician: Referring Donni Oglesby: Treating Vinson Tietze/Extender: [name] in Treatment: 49 Visit Information History Since Last Visit All ordered tests and consults were completed: No Patient Arrived: Ambulatory Added or deleted any medications: No Arrival Time: 08:46 Any new allergies or adverse reactions: No Accompanied By: self Had a fall or experienced change in No Transfer Assistance: None activities of daily living that may affect Patient Identification Verified: Yes risk of falls: Secondary Verification Process Completed: Yes Signs or symptoms of abuse/neglect since last visito No Patient Requires Transmission-Based Precautions: No Hospitalized since last visit: No Patient Has Alerts: No Implantable device outside of the clinic excluding No cellular tissue based products placed in the center since last visit: Pain Present Now: No Electronic Signature(s) Signed: 03/02/2022 10:29:33 AM By: Worthy Rancher Entered By: Worthy Rancher on 03/02/2022 08:46:57 -------------------------------------------------------------------------------- Encounter Discharge Information Details Patient Name: Date of Service: [name], [name] 03/02/2022 8:15 A M Medical Record Number: [id number] Patient Account Number: [id number] Date of Birth/Sex: Treating RN: 05-15-1985 (36 y.o. [name] Primary Care Lance Huaracha: [name] Other Clinician: Referring Yarelly Kuba: Treating Demeshia Sherburne/Extender: [name] in Treatment: 122 Encounter Discharge Information Items Discharge  Condition: Stable Ambulatory Status: Ambulatory Discharge Destination: Home Transportation: Private Auto Accompanied By: self Schedule Follow-up Appointment: Yes Clinical Summary of Care: Patient Declined Electronic Signature(s) Signed: 03/02/2022 4:43:07 PM By: [name] RN, BSN Entered By: [name] on 03/02/2022 11:34:05 [name], Max ([id number]) [id number] Page 2 of 7 -------------------------------------------------------------------------------- Lower Extremity Assessment Details Patient Name: Date of Service: [name] 03/02/2022 8:15 A M Medical Record Number: [id number] Patient Account Number: [id number] Date of Birth/Sex: Treating RN: 1985-12-27 (36 y.o. [name] Primary Care Tessy Pawelski: [name] Other Clinician: Referring Arriona Prest: Treating Alla Sloma/Extender: [name] in Treatment: 122 Edema Assessment Assessed: [name]: No] [name]: No] Edema: [Left: Ye] [Right: s] Calf Left: Right: Point of Measurement: 48 cm From Medial Instep 48.5 cm Ankle Left: Right: Point of Measurement: 11 cm From Medial Instep 30.5 cm Vascular Assessment Pulses: Dorsalis Pedis Palpable: [Left:Yes] Electronic Signature(s) Signed: 03/02/2022 4:43:07 PM By: [name] RN, BSN Entered By: [name] on 03/02/2022 08:54:18 -------------------------------------------------------------------------------- Multi Wound Chart Details Patient Name: Date of Service: [name], [name] 03/02/2022 8:15 A M Medical Record Number: [id number] Patient Account Number: [id number] Date of Birth/Sex: Treating RN: [birth year] (36 y.o. M) Primary Care Jarad Barth: [name] Other Clinician: Referring Jeziel Hoffmann: Treating Ayari Liwanag/Extender: [name] in Treatment: 122 Vital Signs Height(in): 77 Capillary Blood Glucose(mg/dl): 108 Weight(lbs): 280 Pulse(bpm): 87 Body Mass Index(BMI):  33.2 Blood Pressure(mmHg): 132/89 Temperature(F): 97.8 Respiratory Rate(breaths/min): 20 [3:Photos:] [N/A:N/A] Left, Lateral, Plantar Foot N/A N/A Wound Location: Trauma N/A N/A Wounding Event: Diabetic Wound/Ulcer of the Lower N/A N/A Primary Etiology: Extremity Type II Diabetes N/A N/A Comorbid History: 10/02/2019 N/A N/A Date Acquired: 122 N/A N/A Weeks of Treatment: Open N/A N/A Wound Status: No N/A N/A Wound Recurrence: 3.3x3x1.7 N/A N/A Measurements L x W x [name] 7.775 N/A N/A A (cm) : rea 13.218 N/A N/A Volume (cm) : -371.50% N/A N/A % Reduction in A rea: -7910.90% N/A N/A % Reduction in Volume: Grade 2 N/A  N/A Classification: Medium N/A N/A Exudate A mount: Serosanguineous N/A N/A Exudate Type: red, brown N/A N/A Exudate Color: Distinct, outline attached N/A N/A Wound Margin: Large (67-100%) N/A N/A Granulation A mount: Red N/A N/A Granulation Quality: Small (1-33%) N/A N/A Necrotic A mount: Fat Layer (Subcutaneous Tissue): Yes N/A N/A Exposed Structures: Fascia: No Tendon: No Muscle: No Joint: No Bone: No Small (1-33%) N/A N/A Epithelialization: Callus: Yes N/A N/A Periwound Skin Texture: Maceration: Yes N/A N/A Periwound Skin Moisture: No Abnormalities Noted N/A N/A Periwound Skin Color: No Abnormality N/A N/A Temperature: Treatment Notes Electronic Signature(s) Signed: 03/02/2022 9:07:33 AM By: [name] MD FACS Entered By: [name] on 03/02/2022 09:07:33 -------------------------------------------------------------------------------- Multi-Disciplinary Care Plan Details Patient Name: Date of Service: [name], [name] 03/02/2022 8:15 A M Medical Record Number: [id number] Patient Account Number: [id number] Date of Birth/Sex: Treating RN: November 29, 1985 (36 y.o. [name] Primary Care Marketa Midkiff: [name] Other Clinician: Referring Hesston Hitchens: Treating Blaze Sandin/Extender: [name] in Treatment: Halma reviewed with physician Active Inactive Nutrition Nursing Diagnoses: Imbalanced nutrition Potential for alteratiion in Nutrition/Potential for imbalanced nutrition Goals: Patient/caregiver agrees to and verbalizes understanding of need to use nutritional supplements and/or vitamins as prescribed Date Initiated: 10/24/2019 Date Inactivated: 04/06/2020 Target Resolution Date: 04/03/2020 Goal Status: Met Patient/caregiver will maintain therapeutic glucose control Date Initiated: 10/24/2019 Target Resolution Date: 03/16/2022 Goal Status: Active Interventions: [place], Max ([id number]) [id number] Page 4 of 7 Assess HgA1c results as ordered upon admission and as needed Assess patient nutrition upon admission and as needed per policy Provide education on elevated blood sugars and impact on wound healing Provide education on nutrition Treatment Activities: Education provided on Nutrition : 12/07/2021 Notes: 11/17/20: Glucose control ongoing issue, target date extended. 01/26/21: Glucose management continues. Wound/Skin Impairment Nursing Diagnoses: Impaired tissue integrity Knowledge deficit related to ulceration/compromised skin integrity Goals: Patient/caregiver will verbalize understanding of skin care regimen Date Initiated: 10/24/2019 Target Resolution Date: 03/16/2022 Goal Status: Active Ulcer/skin breakdown will have a volume reduction of 30% by week 4 Date Initiated: 10/24/2019 Date Inactivated: 01/16/2020 Target Resolution Date: 01/10/2020 Unmet Reason: no change in Goal Status: Unmet measurements. Interventions: Assess patient/caregiver ability to obtain necessary supplies Assess patient/caregiver ability to perform ulcer/skin care regimen upon admission and as needed Assess ulceration(s) every visit Provide education on ulcer and skin care Notes: 11/17/20: Wound care regimen  continues Electronic Signature(s) Signed: 03/02/2022 4:43:07 PM By: [name] RN, BSN Entered By: [name] on 03/02/2022 09:03:59 -------------------------------------------------------------------------------- Negative Pressure Wound Therapy Maintenance (NPWT) Details Patient Name: Date of Service: [name] 03/02/2022 8:15 A M Medical Record Number: [id number] Patient Account Number: [id number] Date of Birth/Sex: Treating RN: 1985/05/30 (36 y.o. [name] Primary Care Gurleen Larrivee: [name] Other Clinician: Referring Brysyn Brandenberger: Treating Winta Barcelo/Extender: [name] in Treatment: 122 NPWT Maintenance Performed for: Wound #3 Left, Lateral, Plantar Foot Performed By: [name], RN Type: VAC System Coverage Size (sq cm): 9.9 Pressure Type: Constant Pressure Setting: 125 mmHG Drain Type: None Primary Contact: Other : Sponge/Dressing Type: Foam, Black Date Initiated: 02/11/2022 Dressing Removed: No Canister Changed: No Canister Exudate Volume: 50 Dressing Reapplied: No Days On NPWT : 20 Post Procedure Diagnosis Same as Pre-procedure Electronic Signature(s) [place], Max ([id number]) [id number] Page 5 of 7 Signed: 03/02/2022 4:43:07 PM By: [name] RN, BSN Entered By: [name] on 03/02/2022 11:33:09 -------------------------------------------------------------------------------- Pain Assessment Details Patient Name: Date of Service: [name], [name] 03/02/2022 8:15 A M Medical  Record Number: [id number] Patient Account Number: [id number] Date of Birth/Sex: Treating RN: 24-May-1985 (36 y.o. M) Primary Care Aneya Daddona: [name] Other Clinician: Referring Ayla Dunigan: Treating Treina Arscott/Extender: [name] in Treatment: 122 Active Problems Location of Pain Severity and Description of Pain Patient Has Paino No Site Locations Pain Management and  Medication Current Pain Management: Electronic Signature(s) Signed: 03/02/2022 10:29:33 AM By: Worthy Rancher Entered By: Worthy Rancher on 03/02/2022 08:47:46 -------------------------------------------------------------------------------- Patient/Caregiver Education Details Patient Name: Date of Service: [name], [name] 11/29/2023andnbsp8:15 A M Medical Record Number: [id number] Patient Account Number: [id number] Date of Birth/Gender: Treating RN: [birth year] (36 y.o. [name] Primary Care Physician: [name] Other Clinician: Referring Physician: Treating Physician/Extender: [name] in Treatment: 122 Education Assessment Education Provided To: Patient Schmieg, Max ([id number]) [id number] Page 6 of 7 Education Topics Provided Wound/Skin Impairment: Methods: Explain/Verbal Responses: State content correctly Electronic Signature(s) Signed: 03/02/2022 4:43:07 PM By: [name] RN, BSN Entered By: [name] on 03/02/2022 09:04:27 -------------------------------------------------------------------------------- Wound Assessment Details Patient Name: Date of Service: [name], [name] 03/02/2022 8:15 A M Medical Record Number: [id number] Patient Account Number: [id number] Date of Birth/Sex: Treating RN: 08/05/1985 (36 y.o. [name] Primary Care Samanthamarie Ezzell: [name] Other Clinician: Referring Raeleen Winstanley: Treating Bessie Boyte/Extender: [name] in Treatment: 122 Wound Status Wound Number: 3 Primary Etiology: Diabetic Wound/Ulcer of the Lower Extremity Wound Location: Left, Lateral, Plantar Foot Wound Status: Open Wounding Event: Trauma Comorbid History: Type II Diabetes Date Acquired: 10/02/2019 Weeks Of Treatment: 122 Clustered Wound: No Photos Wound Measurements Length: (cm) 3.3 Width: (cm) 3 Depth: (cm) 1.7 Area: (cm) 7.775 Volume: (cm) 13.218 %  Reduction in Area: -371.5% % Reduction in Volume: -7910.9% Epithelialization: Small (1-33%) Tunneling: No Undermining: No Wound Description Classification: Grade 2 Wound Margin: Distinct, outline attached Exudate Amount: Medium Exudate Type: Serosanguineous Exudate Color: red, brown Foul Odor After Cleansing: No Slough/Fibrino No Wound Bed Granulation Amount: Large (67-100%) Exposed Structure Granulation Quality: Red Fascia Exposed: No Necrotic Amount: Small (1-33%) Fat Layer (Subcutaneous Tissue) Exposed: Yes Necrotic Quality: Adherent Slough Tendon Exposed: No Muscle Exposed: No Joint Exposed: No Bone Exposed: No Periwound Skin Texture [place], Max ([id number]) [id number] Page 7 of 7 Texture Color No Abnormalities Noted: No No Abnormalities Noted: Yes Callus: Yes Temperature / Pain Temperature: No Abnormality Moisture No Abnormalities Noted: No Maceration: Yes Treatment Notes Wound #3 (Foot) Wound Laterality: Plantar, Left, Lateral Cleanser Soap and Water Discharge Instruction: May shower and wash wound with dial antibacterial soap and water prior to dressing change. Peri-Wound Care Topical Primary Dressing Promogran Prisma Matrix, 4.34 (sq in) (silver collagen) Discharge Instruction: Moisten collagen with saline or hydrogel VAC Secondary Dressing Secured With Compression Wrap Kerlix Roll 4.5x3.1 (in/yd) Discharge Instruction: Apply Kerlix and Coban compression as directed. Compression Stockings Add-Ons Electronic Signature(s) Signed: 03/02/2022 4:43:07 PM By: [name] RN, BSN Entered By: [name] on 03/02/2022 08:58:52 -------------------------------------------------------------------------------- [place] Details Patient Name: Date of Service: [name], [name] 03/02/2022 8:15 A M Medical Record Number: [id number] Patient Account Number: [id number] Date of Birth/Sex: Treating RN: [birth year] (36 y.o. M) Primary  Care Duriel Deery: [name] Other Clinician: Referring Zonie Crutcher: Treating Kierre Hintz/Extender: [name] in Treatment: 122 Vital Signs Time Taken: 08:45 Temperature (F): 97.8 Height (in): 77 Pulse (bpm): 87 Weight (lbs): 280 Respiratory Rate (breaths/min): 20 Body Mass Index (BMI): 33.2 Blood Pressure (mmHg): 132/89 Capillary Blood Glucose (mg/dl): 108 Reference Range: 80 - 120 mg / dl Electronic Signature(s)  Signed: 03/02/2022 10:29:33 AM By: Worthy Rancher Entered By: Worthy Rancher on 03/02/2022 08:47:28

## 2022-03-09 ENCOUNTER — Encounter (HOSPITAL_BASED_OUTPATIENT_CLINIC_OR_DEPARTMENT_OTHER): Payer: BC Managed Care – PPO | Attending: General Surgery | Admitting: General Surgery

## 2022-03-09 DIAGNOSIS — M86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, right ankle and foot: Secondary | ICD-10-CM | POA: Insufficient documentation

## 2022-03-09 DIAGNOSIS — E11621 Type 2 diabetes mellitus with foot ulcer: Secondary | ICD-10-CM | POA: Diagnosis not present

## 2022-03-09 DIAGNOSIS — M86572 Other chronic hematogenous osteomyelitis, left ankle and foot: Secondary | ICD-10-CM | POA: Insufficient documentation

## 2022-03-09 DIAGNOSIS — L97322 Non-pressure chronic ulcer of left ankle with fat layer exposed: Secondary | ICD-10-CM | POA: Insufficient documentation

## 2022-03-09 DIAGNOSIS — L97528 Non-pressure chronic ulcer of other part of left foot with other specified severity: Secondary | ICD-10-CM | POA: Diagnosis present

## 2022-03-09 DIAGNOSIS — L97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: Secondary | ICD-10-CM | POA: Diagnosis not present

## 2022-03-09 DIAGNOSIS — B9562 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 classified elsewhere: Secondary | ICD-10-CM | POA: Insufficient documentation

## 2022-03-10 NOTE — Progress Notes (Signed)
Pendley, [name] ([id number]) [id number] Page 1 of 21 Visit Report for 03/09/2022 Chief Complaint Document Details Patient Name: Date of Service: [name] 03/09/2022 10:30 A M Medical Record Number: [id number] Patient Account Number: [id number] Date of Birth/Sex: Treating RN: 10-28-1985 (36 y.o. M) Primary Care Provider: Seward [name] Other Clinician: Referring Provider: Treating Provider/Extender: [name] in Treatment: 47 Information Obtained from: Patient Chief Complaint 01/11/2019; patient is here for review of a rather substantial wound over the left fifth plantar metatarsal head extending into the lateral part of his foot 10/24/2019; patient returns to clinic with wounds on his bilateral feet with underlying osteomyelitis biopsy-proven Electronic Signature(s) Signed: 03/09/2022 12:14:06 PM By: Max Maudlin MD FACS Entered By: [name] on 03/09/2022 12:14:06 -------------------------------------------------------------------------------- Debridement Details Patient Name: Date of Service: [name], [name] 03/09/2022 10:30 A M Medical Record Number: [id number] Patient Account Number: [id number] Date of Birth/Sex: Treating RN: 09/08/1985 (36 y.o. [name] Primary Care Provider: Seward [name] Other Clinician: Referring Provider: Treating Provider/Extender: [name] in Treatment: 123 Debridement Performed for Assessment: Wound #3 Left,Lateral,Plantar Foot Performed By: Physician Max Maudlin, MD Debridement Type: Debridement Severity of Tissue Pre Debridement: Fat layer exposed Level of Consciousness (Pre-procedure): Awake and Alert Pre-procedure Verification/Time Out Yes - 10:45 Taken: Start Time: 10:46 T Area Debrided (L x W): otal 3.5 (cm) x 3 (cm) = 10.5 (cm) Tissue and other material debrided: Non-Viable, Callus, Skin: Dermis , Skin: Epidermis Level:  Skin/Epidermis Debridement Description: Selective/Open Wound Instrument: Curette Bleeding: Minimum Hemostasis Achieved: Pressure Procedural Pain: 0 Post Procedural Pain: 0 Response to Treatment: Procedure was tolerated well Level of Consciousness (Post- Awake and Alert procedure): Post Debridement Measurements of Total Wound Length: (cm) 3.5 Width: (cm) 3 Depth: (cm) 1.5 Volume: (cm) 12.37 Character of Wound/Ulcer Post Debridement: Improved Severity of Tissue Post Debridement: Fat layer exposed [name], [name] ([id number]) [id number] Page 2 of 21 Post Procedure Diagnosis Same as Pre-procedure Notes scribed for Dr [name] by Max Creamer, Rn Electronic Signature(s) Signed: 03/09/2022 4:06:00 PM By: Max Creamer RN, BSN Signed: 03/09/2022 5:07:18 PM By: Max Maudlin MD FACS Entered By: [name] on 03/09/2022 10:49:19 -------------------------------------------------------------------------------- HPI Details Patient Name: Date of Service: [name], [name] 03/09/2022 10:30 A M Medical Record Number: [id number] Patient Account Number: [id number] Date of Birth/Sex: Treating RN: Feb 11, 1986 (36 y.o. 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 He comes in for review of a wound over the plantar fifth metatarsal head extending into the lateral part of the foot. He was followed for this previously by his podiatrist Dr. Cornelius [name]. As the patient tells his story he went to see podiatry first for a swelling he developed on the lateral part of his fifth metatarsal head in May. He states this was "open" by podiatry and the area closed. He was followed up in June and it was again opened callus  removed and it closed promptly. There were plans being made for surgery on the fifth metatarsal head in June however his blood sugar was apparently too high for anesthesia. Apparently the area was debrided and opened again in June and it is never closed since. Looking over the records from podiatry I am really not able to follow this. It was clear when he was first  seen it was before 5/14 at that point he already had a wound. By 5/17 the ulcer was resolved. I do not see anything about a procedure. On 5/28 noted to have pre-ulcerative moderate keratosis. X-ray noted 1/5 contracted toe and tailor's bunion and metatarsal deformity. On a visit date on 09/28/2018 the dorsal part of the left foot it healed and resolved. There was concern about swelling in his lower extremity he was sent to the ER.. As far as I can tell he was seen in the ER on 7/12 with an ulcer on his left foot. A DVT rule out of the left leg was negative. I do not think I have complete records from podiatry but I am not able to verify the procedures this patient states he had. He states after the last procedure the wound has never closed although I am not able to follow this in the records I have from podiatry. He has not had a recent x-ray The patient has been using Neosporin on the wound. He is wearing a Darco shoe. He is still very active up on his foot working and exercising. Past medical history; type 2 diabetes ketosis-prone, leg swelling with a negative DVT study in July. Non-smoker ABI in our clinic was 0.85 on the left 10/16; substantial wound on the plantar left fifth met head extending laterally almost to the dorsal fifth MTP. We have been using silver alginate we gave him a Darco forefoot off loader. An x-ray did not show evidence of osteomyelitis did note soft tissue emphysema which I think was due to gas tracking through an open wound. There is no doubt in my mind he requires an MRI 10/23; MRI not booked until 3 November at  the earliest this is largely due to his glucose sensor in the right arm. We have been using silver alginate. There has been an improvement 10/29; I am still not exactly sure when his MRI is booked for. He says it is the third but it is the 10th in epic. This definitely needs to be done. He is running a low-grade fever today but no other symptoms. No real improvement in the 1 02/26/2019 patient presents today for a follow-up visit here in our clinic he is last been seen in the clinic on October 29. Subsequently we were working on getting MRI to evaluate and see what exactly was going on and where we would need to go from the standpoint of whether or not he had osteomyelitis and again what treatments were going be required. Subsequently the patient ended up being admitted to the hospital on 02/07/2019 and was discharged on 02/14/2019. This is a somewhat interesting admission with a discharge diagnosis of pneumonia due to COVID-19 although he was positive for COVID-19 when tested at the urgent care but negative x2 when he was actually in the hospital. With that being said he did have acute respiratory failure with hypoxia and it was noted he also have a left foot ulceration with osteomyelitis. With that being said he did require oxygen for his pneumonia and I level 4 L. He was placed on antivirals and steroids for the COVID-19. He was also transferred to the [facility] at one point. Nonetheless he did subsequently discharged home and since being home has done much better in that regard. The CT angiogram did not show any pulmonary embolism. With regard to the osteomyelitis the patient was placed on vancomycin and Zosyn while in the hospital but has been changed to Augmentin  at discharge. It was also recommended that he follow- up with wound care and podiatry. Podiatry however wanted him to see [name] according to the patient prior to them doing anything further. His hemoglobin A1c was 9.9 as noted in  the hospital. Have an MRI of the left foot performed while in the hospital on 02/04/2019. 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 There is an overlying plantar open wound noted an abscess tracking back along the lateral aspect of the fifth metatarsal shaft. There is otherwise diffuse cellulitis and mild fasciitis without findings of polymyositis. The patient did have recently pneumonia secondary to COVID-19 I looked in the chart through epic and it does appear that the patient may need to have an additional x-ray just to ensure everything is cleared and that he has no airspace disease prior to putting him into the [name]. 03/05/2019; patient was readmitted to the clinic last week. He was hospitalized twice for a viral upper respiratory tract infection from 11/1 through 11/4 and then 11/5 through 11/12 ultimately this turned out to be Covid pneumonitis. Although he was discharged on oxygen he is not using it. He says he feels fine. He has no exercise limitation no cough no sputum. His O2 sat in our clinic today was 100% on room air. 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 He received Vanco and Zosyn in the hospital and then was discharged on 2 weeks of Augmentin. I do not see any relevant cultures. He was supposed to [place], [name] ([id number]) [id number] Page 3 of 21 follow-up with infectious disease but I do not see that he has an appointment. 12/8; patient saw Dr. Novella Olive of infectious disease last week. He felt that he had had adequate antibiotic therapy. He did not go to follow-up with Dr. Amalia Hailey of podiatry and I have again talked to him about the pros and cons of this. He does not want to consider a ray amputation of this time. He is aware of the risks of  recurrence, migration etc. He started HBO today and tolerated this well. He can complete the Augmentin that I gave him last week. I have looked over the lab work that Dr. Chana Bode ordered his C-reactive protein was 3.3 and his sedimentation rate was 17. The C-reactive protein is never really been measurably that high in this patient 12/15; not much change in the wound today however he has undermining along the lateral part of the foot again more extensively than last week. He has some rims of epithelialization. We have been using silver alginate. He is undergoing hyperbarics but did not dive today 12/18; in for his obligatory first total contact cast change. Unfortunately there was pus coming from the undermining area around his fifth metatarsal head. This was cultured but will preclude reapplication of a cast. He is seen in conjunction with HBO 12/24; patient had staph lugdunensis in the wound in the undermining area laterally last time. We put him on doxycycline which should have covered this. The wound looks better today. I am going to give him another week of doxycycline before reattempting the total contact cast 12/31; the patient is completing antibiotics. Hemorrhagic debris in the distal part of the wound with some undermining distally. He also had hyper granulation. Extensive debridement with a #5 curette. The infected area that was  on the lateral part of the fifth met head is closed over. I do not think he needs any more antibiotics. Patient was seen prior to HBO. Preparations for a total contact cast were made in the cast will be placed post hyperbarics 04/11/19; once again the patient arrives today without complaint. He had been in a cast all week noted that he had heavy drainage this week. This resulted in large raised areas of macerated tissue around the wound 1/14; wound bed looks better slightly smaller. Hydrofera Blue has been changing himself. He had a heavy drainage last week which  caused a lot of maceration around the wound so I took him out of a total contact cast he says the drainage is actually better this week He is seen today in conjunction with HBO 1/21; returns to clinic. He was up in [place] for a day or 2 attending a funeral. He comes back in with the wound larger and with a large area of exposed bone. He had osteomyelitis and septic arthritis of the fifth left metatarsal head while he was in hospital. He received IV antibiotics in the hospital for a prolonged period of time then 3 weeks of Augmentin. Subsequently I gave him 2 weeks of doxycycline for more superficial wound infection. When I saw this last week the wound was smaller the surface of the wound looks satisfactory. 1/28; patient missed hyperbarics today. Bone biopsy I did last time showed Enterococcus faecalis and Staphylococcus lugdunensis . He has a wide area of exposed bone. We are going to use silver alginate as of today. I had another ethical discussion with the patient. 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 In this situation I think the likelihood of healing this is low. Therefore I have recommended a ray amputation and with the patient's agreement I have referred him to Dr. Doran Durand. The other issue is that his compliance with hyperbarics has been minimal because of his work schedule and given his underlying decision I am going to stop this today READMISSION 10/24/2019 MRI 09/29/2019 left foot IMPRESSION: 1. Apparent skin ulceration inferior and lateral to the 5th metatarsal base with underlying heterogeneous T2 signal and enhancement in the subcutaneous fat. Small peripherally enhancing fluid collections along the plantar and lateral aspects of the 5th metatarsal base suspicious for abscesses. 2. Interval amputation through the mid 5th metatarsal with nonspecific low-level marrow edema and enhancement. Given the proximity to the adjacent soft tissue  inflammatory changes, osteomyelitis cannot be excluded. 3. The additional bones appear unremarkable. MRI 09/29/2019 right foot IMPRESSION: 1. Soft tissue ulceration lateral to the 5th MTP joint. There is low-level T2 hyperintensity within the 4th and 5th metatarsal heads and adjacent proximal phalanges without abnormal T1 signal or cortical destruction. These findings are nonspecific and could be seen with early marrow edema, hyperemia or early osteomyelitis. No evidence of septic joint. 2. Mild tenosynovitis and synovial enhancement associated with the extensor digitorum tendons at the level of the midfoot. 3. Diffuse low-level muscular T2 hyperintensity and enhancement, most consistent with diabetic myopathy. LEFT FOOT BONE Methicillin resistant staphylococcus aureus Staphylococcus lugdunensis MIC MIC CIPROFLOXACIN >=8 RESISTANT Resistant <=0.5 SENSI... Sensitive CLINDAMYCIN <=0.25 SENS... Sensitive >=8 RESISTANT Resistant ERYTHROMYCIN >=8 RESISTANT Resistant >=8 RESISTANT Resistant GENTAMICIN <=0.5 SENSI... Sensitive <=0.5 SENSI... Sensitive Inducible Clindamycin NEGATIVE Sensitive NEGATIVE Sensitive OXACILLIN >=4 RESISTANT Resistant 2 SENSITIVE Sensitive RIFAMPIN <=0.5 SENSI... Sensitive <=0.5 SENSI... Sensitive TETRACYCLINE <=1 SENSITIVE Sensitive <=1 SENSITIVE Sensitive TRIMETH/SULFA <=10 SENSIT Sensitive <=10 SENSIT Sensitive ... Marland Kitchen..  VANCOMYCIN 1 SENSITIVE Sensitive <=0.5 SENSI... Sensitive Right foot bone . Component 3 wk ago Specimen Description BONE Special Requests RIGHT 4 METATARSAL SAMPLE B Gram Stain NO WBC SEEN NO ORGANISMS SEEN Culture RARE METHICILLIN RESISTANT STAPHYLOCOCCUS AUREUS NO ANAEROBES ISOLATED Performed at [facility], [place] [address], [place], [place] [address] Report Status 10/08/2019 FINAL Organism ID, Bacteria METHICILLIN RESISTANT STAPHYLOCOCCUS AUREUS Burby, [name] ([id number]) [id number] Page 4 of  21 Resulting Agency [facility] Susceptibility Methicillin resistant staphylococcus aureus MIC CIPROFLOXACIN >=8 RESISTANT Resistant CLINDAMYCIN <=0.25 SENS... Sensitive ERYTHROMYCIN >=8 RESISTANT Resistant GENTAMICIN <=0.5 SENSI... Sensitive Inducible Clindamycin NEGATIVE Sensitive OXACILLIN >=4 RESISTANT Resistant RIFAMPIN <=0.5 SENSI... Sensitive TETRACYCLINE <=1 SENSITIVE Sensitive TRIMETH/SULFA <=10 SENSIT Sensitive ... VANCOMYCIN 1 SENSITIVE Sensitive This is a patient we had in clinic earlier this year with a wound over his left fifth metatarsal head. 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 Doran Durand on 05/16/2019. He seems to have developed problems with wounds on his bilateral feet in June 2021 although he may have had problems earlier than this. He was in an urgent care with a right foot ulcer on 09/26/2019 and given a course of doxycycline. This was apparently after having trouble getting into see orthopedics. He was seen by podiatry on 09/28/2019 noted to have bilateral lower extremity ulcers including the left lateral fifth metatarsal base and the right subfifth met head. It was noted that had purulent drainage at that time. He required hospitalization from 6/20 through 7/2. This was because of worsening right foot wounds. He underwent bilateral operative incision and drainage and bone biopsies bilaterally. Culture results are listed above. He has been referred back to clinic by Dr. Jacqualyn Posey of podiatry. He is also followed by Dr. Megan Salon who saw him yesterday. He was discharged from hospital on Zyvox Flagyl and Levaquin and yesterday changed to doxycycline Flagyl and Levaquin. His inflammatory markers  on 6/26 showed a sedimentation rate of 129 and a C-reactive protein of 5. This is improved to 14 and 1.3 respectively. This would indicate improvement. ABIs in our clinic today were 1.23 on the right and 1.20 on the left 11/01/2019 on evaluation today patient appears to be doing fairly well in regard to the wounds on his feet at this point. Fortunately there is no signs of active infection at this time. No fevers, chills, nausea, vomiting, or diarrhea. He currently is seeing infectious disease and still under their care at this point. Subsequently he also has both wounds which she has not been using collagen on as he did not receive that in his packaging he did not call us and let us know that. Apparently that just was missed on the order. Nonetheless we will get that straightened out today. 8/9-Patient returns for bilateral foot wounds, using Prisma with hydrogel moistened dressings, and the wounds appear stable. Patient using surgical shoes, avoiding much pressure or weightbearing as much as possible 8/16; patient has bilateral foot wounds. 1 on the right lateral foot proximally the other is on the left mid lateral foot. Both required debridement of callus and thick skin around the wounds. We have been using silver collagen 8/27; patient has bilateral lateral foot wounds. The area on the left substantially surrounded by callus and dry skin. This  was removed from the wound edge. The underlying wound is small. The area on the right measured somewhat smaller today. 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 Unfortunately I did not update his antibiotics during today's visit. 9/10 I reviewed Dr. Hale Bogus last notes he felt he had completed antibiotics his inflammatory markers were reasonably well controlled. He has a small wound on the lateral left foot and a tiny area on the right which is just above closed. He is using Hydrofera Blue  with border foam he has bilateral surgical shoes 9/24; 2 week f/u. doing well. right foot is closed. left foot still undermined. 10/14; right foot remains closed at the fifth met head. The area over the base of the left fifth metatarsal has a small open area but considerable undermining towards the plantar foot. Thick callus skin around this suggests an adequate pressure relief. We have talked about this. He says he is going to go back into his cam boot. I suggested a total contact cast he did not seem enamored with this suggestion 10/26; left foot base of the fifth metatarsal. Same condition as last time. He has skin over the area with an open wound however the skin is not adherent. He went to see Dr. Earleen Newport who did an x-ray and culture of his foot I have not reviewed the x-ray but the patient was not told anything. He is on doxycycline 11/11; since the patient was last here he was in the emergency room on 10/30 he was concerned about swelling in the left foot. They did not do any cultures or x-rays. They changed his antibiotics to cephalexin. Previous culture showed group B strep. The cephalexin is appropriate as doxycycline has less than predictable coverage. Arrives in clinic today with swelling over this area under the wound. He also has a new wound on the right fifth metatarsal head 11/18; the patient has a difficult wound on the lateral aspect of the left fifth metatarsal head. The wound was almost ballotable last week I opened it slightly expecting to see purulence however there was just bleeding. I cultured this this was negative. X-ray unchanged. We are trying to get an MRI but I am not sure were going to be able to get this through his insurance. He also has an area on the right lateral fifth metatarsal head this looks healthier 12/3; the patient finally got our MRI. Surprisingly this did not show osteomyelitis. I did show the soft tissue ulceration at the lateral plantar aspect of the  fifth metatarsal base with a tiny residual 6 mm abscess overlying the superficial fascia I have tried to culture this area I have not been able to get this to grow anything. Nevertheless the protruding tissue looks aggravated. I suspect we should try to treat the underlying "abscess with broad-spectrum antibiotics. I am going to start him on Levaquin and Flagyl. He has much less edema in his legs and I am going to continue to wrap his legs and see him weekly 12/10. I started Levaquin and Flagyl on him last week. He just picked up the Flagyl apparently there was some delay. The worry is the wound on the left fifth metatarsal base which is substantial and worsening. His foot looks like he inverts at the ankle making this a weightbearing surface. Certainly no improvement in fact I think the measurements of this are somewhat worse. We have been using 12/17; he apparently just got the Levaquin  yesterday this is 2 weeks after the fact. He has completed the Flagyl. The area over the left fifth metatarsal base still has protruding granulation tissue although it does not look quite as bad as it did some weeks ago. He has severe bilateral lymphedema although we have not been treating him for wounds on his legs this is definitely going to require compression. There was so much edema in the left I did not wish to put him in a total contact cast today. I am going to increase his compression from 3-4 layer. The area on the right lateral fifth met head actually look quite good and superficial. 12/23; patient arrived with callus on the right fifth met head and the substantial hyper granulated callused wound on the base of his fifth metatarsal. He says he is completing his Levaquin in 2 days but I do not think that adds up with what I gave him but I will have to double check this. We are using Hydrofera Blue on both areas. My plan is to put the left leg in a cast the week after New Year's 04/06/2020; patient's wounds  about the same. Right lateral fifth metatarsal head and left lateral foot over the base of the fifth metatarsal. There is undermining on the left lateral foot which I removed before application of total contact cast continuing with Hydrofera Blue new. Patient tells me he was seen by endocrinology today lab work was done [Dr. Kerr]. Also wondering whether he was referred to cardiology. I went over some lab work from previously does not have chronic renal failure certainly not nephrotic range proteinuria he does have very poorly controlled diabetes but this is not his most updated lab work. Hemoglobin A1c has been over 11 1/10; the patient had a considerable amount of leakage towards mid part of his left foot with macerated skin however the wound surface looks better the area on the right lateral fifth met head is better as well. I am going to change the dressing on the left foot under the total contact cast to silver alginate, continue with Hydrofera Blue on the right. 1/20; patient was in the total contact cast for 10 days. Considerable amount of drainage although the skin around the wound does not look too bad on the left foot. The area on the right fifth metatarsal head is closed. Our nursing staff reports large amount of drainage out of the left lateral foot wound 1/25; continues with copious amounts of drainage described by our intake staff. PCR culture I did last week showed E. coli and Enterococcus faecalis and low quantities. Multiple resistance genes documented including extended spectrum beta lactamase, MRSA, MRSE, quinolone, tetracycline. The wound is not quite as good this week as it was 5 days ago but about the same size 2/3; continues with copious amounts of malodorous drainage per our intake nurse. The PCR culture I did 2 weeks ago showed E. coli and low quantities of Enterococcus. There were multiple resistance genes detected. I put Neosporin on him last week although this does not seem  to have helped. The wound is BROUILLARD, [name] ([id number]) [id number] Page 5 of 21 slightly deeper today. Offloading continues to be an issue here although with the amount of drainage she has a total contact cast is just not going to work 2/10; moderate amount of drainage. Patient reports he cannot get his stocking on over the dressing. I told him we have to do that the nurse gave him suggestions on how to make  this work. The wound is on the bottom and lateral part of his left foot. Is cultured predominantly grew low amounts of Enterococcus, E. coli and anaerobes. There were multiple resistance genes detected including extended spectrum beta lactamase, quinolone, tetracycline. I could not think of an easy oral combination to address this so for now I am going to do topical antibiotics provided by [name] I think the main agents here are vancomycin and an aminoglycoside. We have to be able to give him access to the wounds to get the topical antibiotic on 2/17; moderate amount of drainage this is unchanged. He has his Keystone topical antibiotic against the deep tissue culture organisms. He has been using this and changing the dressing daily. Silver alginate on the wound surface. 2/24; using Keystone antibiotic with silver alginate on the top. He had too much drainage for a total contact cast at one point although I think that is improving and I think in the next week or 2 it might be possible to replace a total contact cast I did not do this today. In general the wound surface looks healthy however he continues to have thick rims of skin and subcutaneous tissue around the wide area of the circumference which I debrided 06/04/2020 upon evaluation today patient appears to be doing well in regard to his wound. I do feel like he is showing signs of improvement. There is little bit of callus and dead tissue around the edges of the wound as well as what appears to be a little bit of  a sinus tract that is off to the side laterally I would perform debridement to clear that away today. 3/17; left lateral foot. The wound looks about the same as I remember. Not much depth surface looks healthy. No evidence of infection 3/25; left lateral foot. Wound surface looks about the same. Separating epithelium from the circumference. There really is no evidence of infection here however not making progress by my view 3/29; left lateral foot. Surface of the wound again looks reasonably healthy still thick skin and subcutaneous tissue around the wound margins. There is no evidence of infection. One of the concerns being brought up by the nurses has again the amount of drainage vis--vis continued use of a total contact cast 4/5; left lateral foot at roughly the base of the fifth metatarsal. Nice healthy looking granulated tissue with rims of epithelialization. The overall wound measurements are not any better but the tissue looks healthy. The only concern is the amount of drainage although he has no surrounding maceration with what we have been doing recently to absorb fluid and protect his skin. He also has lymphedema. 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 He states that the kerlix/coban under the TCC rolled down and could not get it back up. He has been using an offloading scooter and has somehow hurt his right foot using this device. This happened last week. He states that the side of his right foot developed a blister and opened. The top of his foot also has a few small open wounds he thinks is due to his socks rubbing in his shoes. He has not been using any dressings to the wound. He denies purulent drainage, fever/chills or erythema  to the  wounds. 4/22; patient presents for 1 week follow-up. He developed new wounds to the right foot that were evaluated at last clinic visit. He continues to have a total contact cast to the left leg and he reports no issues. He has been using silver collagen to the right foot wounds with no issues. He denies purulent drainage, fever/chills or erythema to the right foot wounds. He has no complaints today 4/25; patient presents for 1 week follow-up. He has a total contact cast of the left leg and reports no issues. He has been using silver alginate to the right foot wound. He denies purulent drainage, fever/chills or erythema to the right foot wounds. 5/2 patient presents for 1 week follow-up. T contact cast on the left. The wound which is on the base of the plantar foot at the base of the fifth metatarsal otal actually looks quite good and dimensions continue to gradually contract. HOWEVER the area on the right lateral fifth metatarsal head is much larger than what I remember from 2 weeks ago. Once more is he has significant levels of hypergranulation. Noteworthy that he had this same hyper granulated response on his wound on the left foot at one point in time. So much so that he I thought there was an underlying fluid collection. Based on this I think this just needs debridement. 5/9; the wound on the left actually continues to be gradually smaller with a healthy surface. Slight amount of drainage and maceration of the skin around but not too bad. However he has a large wound over the right fifth metatarsal head very much in the same configuration as his left foot wound was initially. I used silver nitrate to address the hyper granulated tissue no mechanical debridement 5/16; area on the left foot did not look as healthy this week deeper thick surrounding macerated skin and subcutaneous tissue. The area on the right foot fifth met head was about the same The area on the right ankle that we identified  last week is completely broken down into an open wound presumably a stocking rubbing issue 5/23; patient has been using a total contact cast to the left side. He has been using silver alginate underneath. He has also been using silver alginate to the right foot wounds. He has no complaints today. He denies any signs of infection. 5/31; the left-sided wound looks some better measure smaller surface granulation looks better. We have been using silver alginate under the total contact cast The large area on his right fifth met head and right dorsal foot look about the same still using silver alginate 6/6; neither side is good as I was hoping although the surface area dimensions are better. A lot of maceration on his left and right foot around the wound edge. Area on the dorsal right foot looks better. He says he was traveling. I am not sure what does the amount of maceration around the plantar wounds may be drainage issues 6/13; in general the wound surfaces look quite good on both sides. Macerated skin and raised edges around the wound required debridement although in general especially on the left the surface area seems improved. The area on the right dorsal ankle is about the same I thought this would not be such a problem to close 6/20; not much change in either wound although the one on the right looks a little better. Both wounds have thick macerated edges to the skin requiring debridements. We have been using silver alginate. The  area on the dorsal right ankle is still open I thought this would be closed. 6/28; patient comes in today with a marked deterioration in the right foot wound fifth met head. Wide area of exposed bone this is a drastic change from last time. The area on the left there we have been casting is stagnant. We have been using silver alginate in both wound areas. 7/5; bone culture I did for PCR last time was positive for Pseudomonas, group B strep, Enterococcus and Staph aureus.  There was no suggestion of methicillin resistance or ampicillin resistant genes. This was resistant to tetracycline however He comes into the clinic today with the area over his right plantar fifth metatarsal head which had been doing so well 2 weeks ago completely necrotic feeling bone. I do not know that this is going to be salvageable. The left foot wound is certainly no smaller but it has a better surface and is superficial. 7/8; patient called in this morning to say that his total contact cast was rubbing against his foot. He states he is doing fine overall. He denies signs of infection. 7/12; continued deterioration in the wound over the right fifth metatarsal head crumbling bone. This is not going to be salvageable. The patient agrees and wants to be referred to Dr. Doran Durand which we will attempt to arrange as soon as possible. I am going to continue him on antibiotics as long as that takes so I will renew those today. The area on the left foot which is the base of the fifth metatarsal continues to look somewhat better. Healthy looking tissue no depth no debridement is necessary here. 7/20; the patient was kindly seen by Dr. Doran Durand of orthopedics on 10/19/2020. 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 Towards this end we have taken him out of the total contact cast on the left we will put him in a wrap with Hydrofera Blue. As I understand things surgery is planned for 7/21 7/27; patient had his surgery last Thursday. 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 Apparently everything went well we did not still disturb that today The area on the left foot actually looks quite good. He has been much less mobile which probably explains this he did not seem to do well in the total contact cast secondary to drainage and maceration I think. We have been using [place], [name] ([id number])  [id number] Page 6 of 21 11/09/2020 upon evaluation today patient appears to be doing well with regard to his plantar foot ulcer on the left foot. Fortunately there is no evidence of active infection at this time. No fevers, chills, nausea, vomiting, or diarrhea. Overall I think that he is actually doing extremely well. Nonetheless I do believe that he is staying off of this more following the surgery in his right foot that is the reason the left is doing so great. 8/16; left plantar foot wound. This looks smaller than the last time I saw this he is using Hydrofera Blue. The surgical wound on the right foot is being followed by Dr. Doran Durand we did not look at this today. He has surgical shoes on both feet 8/23; left plantar foot wound not as good this week. Surrounding macerated skin and subcutaneous tissue everything looks moist and wet. I do not think he is offloading this adequately. He is using a surgical shoe Apparently the  right foot surgical wound is not open although I did not check his foot 8/31; left plantar foot lateral aspect. Much improved this week. He has no maceration. Some improvement in the surface area of the wound but most impressively the depth is come in we are using silver alginate. The patient is a Product/process development scientist. He is asked that we write him a letter so he can go back to work. I have also tried to see if we can write something that will allow him to limit the amount of time that he is on his foot at work. 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 Hopefully the latter can be avoided 9/6; I believe the patient missed an appointment last week. He arrives in today with a wound looking roughly the same certainly no better. Undermining laterally and also inferiorly. We used [employer]cuLight today in training with the patient's permission.. We are using silver alginate 9/21  wound is measuring bigger this week although this may have to do with the aggressive circumferential debridement last week in response to the blush fluorescence on the MolecuLight. Culture I did last week showed significant MSSA and E. coli. I put him on Augmentin but he has not started it yet. We are also going to send this for compounded antibiotics at [facility]. There is no evidence of systemic infection 9/29; silver alginate. His Keystone arrived. He is completing Augmentin in 2 days. Offloading in a cam boot. Moderate drainage per our intake staff 10/5; using silver alginate. He has been using his [place]. He has completed his Augmentin. Per our intake nurse still a lot of drainage, far too much to consider a total contact cast. Wound measures about the same. He had the same undermining area that I defined last week from a roughly 11-3. I remove this today 10/12; using silver alginate he is using the [place]. He comes in for a nurse visit hence we are applying [employer] twice a week. Measuring slightly better today and less notable drainage. Extensive debridement of the wound edge last time 10/18; using topical Keystone and silver alginate and a soft cast. Wound measurements about the same. Drainage was through his soft cast. We are changing this twice a week Tuesdays and Friday 10/25; comes in with moderate drainage. Still using Keystone silver alginate and a soft cast. Wound dimensions completely the same.He has a lot of edema in the left leg he has lymphedema. Asking for [name] to consider wrapping him as he cannot get his stocking on over the soft cast 11/2; comes in with moderate to large drainage slightly smaller in terms of width we have been using [place]. His wound looks satisfactory but not much improvement 11/4; patient presents today for obligatory cast change. Has no issues or complaints today. He denies signs of infection. 11/9; patient traveled this weekend to [place], was on the cast  quite a bit. Staining of the cast with black material from his walking boot. Drainage was not quite as bad as we feared. Using silver alginate and Keystone 11/16; we do not have size for cast therefore we have been putting a soft cast on him since the change on Friday. Still a significant amount of drainage necessitating changing twice a week. We have been using the Keystone at cast changes either hard or soft as well as silver alginate Comes in the clinic with things actually looking fairly good improvement in  width. He says his offloading is about the same 02/24/2021 upon evaluation today patient actually comes back in and is doing excellent in regard to his foot ulcer this is significantly smaller even compared to the last visit. The soft cast seems to have done extremely well for him which is great news. I do not see any signs of infection minimal debridement will be needed today. 11/30; left lateral foot much improved half a centimeter improvement in surface area. No evidence of infection. He seems to be doing better with the soft cast in the TCC therefore we will continue with this. He comes back in later in the week for a change with the nurses. This is due to drainage 12/6; no improvement in dimensions. Under illumination some debris on the surface we have been using silver alginate, soft cast. If there is anything optimistic here he seems to have have less drainage 12/13. Dimensions are improved both length and width and slightly in depth. Appears to be quite healthy today. Raised edges of this thick skin and callus around the edges however. He is in a soft cast were bringing him back once for a change on Friday. Drainage is better 12/20. Dimensions are improved. He still has raised edges of thick skin and callus around the edges. We are using a soft cast 12/28; comes in today with thick callus around the wound. Using silver under alginate under a soft cast. I do not think there is much  improvement in any measurement 2023 04/06/2021; patient was put in a total contact cast. Unfortunately not much change in surface area 1/10; not much different still thick callus and skin around the edge in spite of the total contact cast. This was just debrided last week we have been using the [facility] compounded antibiotic and silver alginate under a total contact cast 1/18 the patient's wound on the left side is doing nicely. smaller HOWEVER he comes in today with a wound on the right foot laterally. blister most likely serosangquenous drainage 1/24; the patient continues to do well in terms of the plantar left foot which is continued to contract using silver alginate under the total contact cast HOWEVER the right lateral foot is bigger with denuded skin around the edges. I used pickups and a #15 scalpel to remove this this looks like the remanence of a large blister. Cannot rule out infection. Culture in this area I did last week showed Staphylococcus lugdunensis few colonies. I am going to try to address this with his [employer] antibiotic that is done so well on the left having linezolid and this should cover the staph 2/1; the patient's wound on his left foot which was the original plantar foot wound thick skin and eschar around the edges even in the total contact cast but the wound surface does not look too bad The real problem is on how his right lateral foot at roughly the base of the fifth metatarsal. The wound is completely necrotic more worrisome than that there is swelling around the edges of this. We have been using silver alginate on both wounds and Keystone on the right foot. Unfortunately I think he is going to require systemic antibiotics while we await cultures. He did not get the x-ray done that we ordered last week [lost the prescription 2/7; disappointingly in the area on the left foot which we are treating with a total contact cast is still not closed although it is much smaller.  He continues to have a lot of  callus around the wound edge. -Right lateral foot culture I did last week was negative x-ray also negative for osteomyelitis. 2/15: TCC silver alginate on the left and silver alginate on the right lateral. No real improvement in either area 05/26/2021: T oday, the wounds are roughly the same size as at his previous visit, post-debridement. He continues to endorse fairly substantial drainage, particularly on the right. He has been in a total contact cast on the left. There is still some callus surrounding this lesion. On the right, the periwound skin is quite macerated, along with surrounding callus. The center of the right-sided wound also has some dark, densely adherent material, which is very difficult to remove. 06/02/2021: Today, both wounds are slightly smaller. He has been using zinc oxide ointment around the right ulcer and the degree of maceration has improved markedly. There continues to be an area of nonviable tissue in the center of the right sided ulcer. The left-sided wound, which has been in the total contact [place], [name] ([id number]) [id number] Page 7 of 21 cast. Appears clean and the degree of callus around it is less than previously. 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 He had to take the stairs and both wounds have increased in size. The left foot, which has been in a total contact cast, has developed a tunnel tracking to the lateral aspect of his foot. The nonviable tissue in the center of the right-sided ulcer remains recalcitrant to debridement. There is significant undermining surrounding the entirety of the left sided wound. 06/16/2021: The elevator at school has been fixed and the patient has been able to avoid putting as much weight on his wounds over the past week. We converted the left foot wound into a single lesion today, but despite this, the  wound is actually smaller. The base is healthy with limited periwound callus. On the right, the central necrotic area is still present. He continues to be quite macerated around the right sided wound, despite applying barrier cream. This does, however, have the benefit of softening the callus to make it more easily removable. 06/23/2021: Today, the left wound is smaller. The lateral aspect that had opened up previously is now closed. The wound base has a healthy bed of granulation tissue and minimal slough. Unfortunately, on the right, the wound is larger and continues to be fairly macerated. He has also reopened the wound at his right ankle. He thinks this is due to the gait he has adopted secondary to his total contact cast and boot. 06/30/2021: T oday, both wounds are a little bit larger. The lateral aspect on the left has remained closed. He continues to have significant periwound maceration. The culture that I took from the right sided wound grew a population of bacteria that is not covered by his current [facility] antibiotic. The center of the right- sided wound continues to appear necrotic with nonviable fat. It probes deeper today, but does not reach bone. 07/07/2021: The periwound maceration is a little bit less today. The right lateral foot wound has some areas that appear more viable and the necrotic center also looks a little bit better. The wound on the dorsal surface of his right foot near the ankle is contracting and the surface appears healthy. The left plantar wound surface looks healthy, but there is some new undermining on the medial portion. He did get his new Keystone antibiotic and began applying that to the right foot wound  on Saturday. 07/14/2021: The intake nurse reported substantial drainage from his wounds, but the periwound skin actually looks better than is typical for him. The wound on the dorsal surface of his right foot near the ankle is smaller and just has a small open area  underneath some dried eschar. The left plantar wound surface looks healthy and there has been no significant accumulation of callus. The right lateral foot wound looks quite a bit better, with the central portion, which typically appears necrotic, looking more viable albeit pale. 07/22/2021: His left foot is extremely macerated today. The wound is about the same size. The wound on the dorsal surface of his right foot near the ankle had closed, but he traumatized it removing the dressing and there is a tiny skin tear in that location. The right lateral foot wound is bigger, but the surface appears healthy. 07/30/2021: The wound on the dorsal surface of his right foot near the ankle is closed. The right lateral foot wound again is a little bit bigger due to some undermining. The periwound skin is in better condition, however. He has been applying zinc oxide. The wound surface is a little bit dry today. On the left, he does not have the substantial maceration that we frequently see. The wound itself is smaller and has a clean surface. 08/06/2021: Both wounds seem to have deteriorated over the past week. The right lateral foot wound has a dry surface but the periwound is boggy.. Overall wound dimensions are about the same. On the left, the wound is about the same size, but there is more undermining present underneath periwound callus. 08/13/2021: The right sided wound looks about the same, but on the left there has been substantial deterioration. The undermining continues to extend under periwound callus. Once this was removed, substantial extension of the wound was present. There is no odor or purulent drainage but clearly the wounds have broken down. 08/20/2021: The wounds look about the same today. He has been out of his total contact cast and has just been changing the dressings using topical Keystone with PolyMem Ag, Kerlix and Ace bandages. The wound on the top of his right ankle has reopened but this is  quite small. There was a little bit of purulent material that I expressed when examining this wound. 08/24/2021: After the aggressive debridement I performed at his last visit, the wounds actually look a little bit better today. They are smaller with the exception of the wound on the top of his right ankle which is a little bit bigger as some more skin pulled off when he was changing his dressing. We are using topical Keystone with PolyMem Ag Kerlix and Ace bandages. 09/02/2021: There has been really no change to any of his wounds. 09/16/2021: The patient was hospitalized last week with nausea, vomiting, and dehydration. He says that while he was in the hospital, his wounds were not really addressed properly. T oday, both plantar foot wounds are larger and the periwound skin is macerated. The wound on the dorsum of his right foot has a scab on the top. The right foot now has a crater where previously he had had nonviable fat. It looks as though this simply died and fell out. The periwound callus is wet. 09/24/2021: His wounds have deteriorated somewhat since his last visit. The wound on the dorsum of his right foot near his ankle is larger and has more nonviable tissue present. The crater in his right foot is even deeper; I cannot  quite palpate or probe to bone but I am sure it is close. The wound on his left plantar foot has an odd boggy area in the center that almost feels as though it has fluid within it. He has run out of his topical Keystone antibiotic. We are using silver alginate on his wounds. 09/29/2021: He has developed a new wound on the dorsum of his left foot near his ankle. He says he thinks his wrapping is rubbing in that site. I would concur with this as the wound on his right ankle is larger. The left foot looks about the same. The right foot has the crater that was present last week. No significant slough accumulation, but his foot remains quite swollen and warm despite oral antibiotic  therapy. 10/08/2021: All of his wounds look about the same as last week. He did not start his oral antibiotics that are prescribed until just a couple of days ago; his [employer] compounded antibiotics formula has been changed and he is awaiting delivery of the new recipe. His MRI that was scheduled for earlier this week was canceled as no prior authorization had been obtained; unfortunately the tech responsible sent an email to my old [employer] email, which I no longer use nor have access to. 10/18/2021: The wounds on his bilateral dorsal feet near the ankles are both improved. They are smaller and have just some eschar and slough buildup. The left plantar wound has a fair amount of undermining, but the surface is clean. There is some periwound callus accumulation. On the right plantar foot, there is nonviable fat leading to a deep tunnel that tracks towards his dorsal medial foot. There is periwound callus and slough accumulation, as well. His right foot and leg remain swollen as compared to the left. 10/25/2021: The wounds on his bilateral dorsal feet and at the ankles have broken down somewhat. They are little bit larger than last week. The left plantar wound continues to undermine laterally but the surface is clean. The right plantar foot wound shows some decreased depth in the tunnel tracking towards his dorsal medial foot. He has not yet had the Doppler study that I ordered; it sounds like there is some confusion about the scheduling of the procedure. In addition, the MRI was denied and I have taken steps to appeal the denial. 11/24/2021: Since our last visit, Mr. Demarais was admitted to the hospital where an MRI suggested osteomyelitis. He was taken the operating room by podiatry. Bone biopsies were negative for osteomyelitis. They debrided his wounds and applied myriad matrix. He saw them last week and they removed his staples. He is here today to continue his wound healing process. T oday, both  of the dorsal foot/ankle wounds are substantially smaller. There is just a little eschar overlying the left sided wound and some eschar and slough on the right. The right plantar foot ulcer has the healthiest surface of granulation tissue that I have seen to date. A portion of the myriad matrix failed to take and was hanging loose. It appears that myriad morcells were placed into the tunnel closest to the dorsal portion of his foot. These have sloughed off. The left plantar foot ulcer is about the same size, but has a much healthier surface than in the past. Both plantar ulcers have callus and slough accumulation. 12/07/2021: Left dorsal foot/ankle wound is closed. The right dorsal foot/ankle wound is nearly closed and just has a small open area with some eschar and slough. The right  plantar foot wound has contracted quite a bit since our last visit. It has a healthy surface with just a little bit of slough accumulation and [place], [name] ([id number]) [id number] Page 8 of 21 periwound callus buildup. The left plantar foot wound is about the same size but the surface appears healthy. There is a little slough and periwound callus on this side, as well. 12/15/2021: Both dorsal foot/ankle wounds are closed. The right plantar foot wound is substantially smaller than at our last visit. The tunneling that was present has nearly closed. There is just a little bit of slough buildup. The left plantar foot wound is also a little bit smaller today. The surface is the healthiest that I have ever seen it. Light slough and periwound callus accumulation on this side. 12/22/2021: The dorsal ankle/foot wounds remain closed. The right plantar foot wound continues to contract. There is still a bit of depth at the lateral portion of the wound but the surface has a good granulated appearance. The wound on the left is about the same size, the central indented portion is still adherent. It also is  clean with good granulation tissue present. The periwound skin and callus are a bit macerated, however. 12/29/2021: Both ankle wounds remain closed. The right plantar foot wound is less than half the size that it was last week. There is no depth any longer and the surface has just a little bit of slough and periwound callus. On the left, the wound is not much smaller, but the surface is healthier with good granulation tissue. There is also a little slough and periwound callus buildup. 01/05/2022: The right plantar foot wound continues to contract and is once again about half the size as it was last week. There is just a little bit of surface slough and periwound callus. On the left, the depth of the wound has decreased and the diameter has started to contract. It is also very clean with just a little slough and biofilm present. 01/12/2022: The right plantar foot wound is smaller again today. There is just some periwound callus and slough accumulation. On the left, there is a fair amount of undermining and the surface is a little boggy, but no other significant change. 01/19/2022: The right plantar wound continues to contract. There is minimal periwound callus accumulation and just a light layer of slough on the surface. On the left, the surface looks better, but there is fairly significant undermining near the 11:00 portion of the wound, aiming towards his great toe. The skin overlying this area is very healthy and has a good fat layer present. No malodor or purulent drainage. 01/26/2022: The right sided wound continues to contract and has just a light layer of slough on the surface. Minimal periwound callus. On the left, he still has substantial undermining and the tissue surface is not as robust as I would like to see. No malodor or purulent drainage, but he did say he had a lot of serous drainage over the weekend. 02/02/2022: The right foot wound is about a quarter of the size that it was at his last  visit. There is just a little slough on the surface and some periwound callus. On the left, the undermining persists and the tissue is still more pale, but he does not seem to have had as much drainage over the last weekend. We are waiting on a wound VAC. 02/09/2022: His right foot has healed. He received the wound VAC, but did not bring it to clinic  with him today. The lateral aspect of the left foot wound has come in a little, but he still has substantial undermining on the medial and distal portion of the wound. Still with macerated periwound callus. 02/16/2022: The wound VAC was applied last Friday. T oday, there has been tremendous improvement in the surface of the wound bed. The undermined portion of the wound has come in a bit. There is still some overhanging dead skin. Overall the wound looks much better. 02/23/2022: No significant change in the overall wound dimensions, but the wound surface continues to improve and appears healthier and more robust. There is some periwound callus accumulation and overhanging skin. No concern for infection. 03/02/2022: Although the measurements taken in clinic today were unchanged, the visual appearance of the wound looks like it is smaller and the undermining/tunneling is filling with flesh. There is less periwound tissue maceration than usual. No malodor or purulent drainage. No concern for infection. 03/09/2022: The undermining has come in by couple of millimeters. The periwound is a little bit more macerated this week. No concern for infection. Electronic Signature(s) Signed: 03/09/2022 12:14:58 PM By: Max Maudlin MD FACS Entered By: [name] on 03/09/2022 12:14:57 -------------------------------------------------------------------------------- Physical Exam Details Patient Name: Date of Service: [name], [name] 03/09/2022 10:30 A M Medical Record Number: [id number] Patient Account Number: [id number] Date of Birth/Sex: Treating  RN: [birth year] (36 y.o. M) Primary Care Provider: Seward [name] Other Clinician: Referring Provider: Treating Provider/Extender: [name] in Treatment: 29 Constitutional Slightly hypertensive. . . . No acute distress. Respiratory Normal work of breathing on room air. Notes 03/09/2022: The undermining has come in by couple of millimeters. The periwound is a little bit more macerated this week. No concern for infection. Electronic Signature(s) Signed: 03/09/2022 12:15:58 PM By: Max Maudlin MD FACS Entered By: [name] on 03/09/2022 12:15:58 [place], [name] ([id number]) [id number] Page 9 of 21 -------------------------------------------------------------------------------- Physician Orders Details Patient Name: Date of Service: [name] 03/09/2022 10:30 A M Medical Record Number: [id number] Patient Account Number: [id number] Date of Birth/Sex: Treating RN: [birth year] (36 y.o. 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 - Dr. Celine [name] - Room 1 with [name] Return A Wed 12/13 @ 8:15 am Bathing/ Shower/ Hygiene May shower and wash wound with soap and water. Negative Presssure Wound Therapy Wound #3 Left,Lateral,Plantar Foot Wound Vac to wound continuously at 110m/hg pressure Black Foam Edema Control - Lymphedema / SCD / Other Bilateral Lower Extremities Elevate legs to the level of the heart or above for 30 minutes daily and/or when sitting, a frequency of: - throughout the day Avoid standing for long periods of  time. Exercise regularly Compression stocking or Garment 20-30 mm/Hg pressure to: - to both legs daily Off-Loading Open toe surgical shoe to: - Both feet Additional Orders / Instructions Follow Nutritious Diet Non Wound Condition pply the following to affected area as directed: - continue to apply foam donut or cushion to healed right plantar foot A Wound Treatment Wound #3 - Foot Wound Laterality: Plantar, Left, Lateral Cleanser: Soap and Water 3 x Per Week/30 Days Discharge Instructions: May shower and wash wound with dial antibacterial soap and water prior  to dressing change. Peri-Wound Care: Skin Prep 3 x Per Week/30 Days Discharge Instructions: Use skin prep as directed Prim Dressing: Promogran Prisma Matrix, 4.34 (sq in) (silver collagen) 3 x Per Week/30 Days ary Discharge Instructions: Moisten collagen with saline or hydrogel Prim Dressing: VAC ary 3 x Per Week/30 Days Compression Wrap: Kerlix Roll 4.5x3.1 (in/yd) (Generic) 3 x Per Week/30 Days Discharge Instructions: Apply Kerlix and Coban compression as directed. Electronic Signature(s) Signed: 03/09/2022 5:07:18 PM By: Max Maudlin MD FACS Entered By: [name] on 03/09/2022 12:16:13 [place], [name] ([id number]) [id number] Page 10 of 21 -------------------------------------------------------------------------------- Problem List Details Patient Name: Date of Service: [name] 03/09/2022 10:30 A M Medical Record Number: [id number] Patient Account Number: [id number] Date of Birth/Sex: Treating RN: Apr 03, 1986 (36 y.o. M) Primary Care Provider: Seward [name] Other Clinician: Referring Provider: Treating Provider/Extender: [name] in Treatment: 59 Active Problems ICD-10 Encounter Code Description Active Date MDM Diagnosis E11.621 Type 2 diabetes mellitus with foot ulcer 10/24/2019 No Yes L97.528 Non-pressure chronic ulcer of other part of  left foot with other specified 10/24/2019 No Yes severity Inactive Problems ICD-10 Code Description Active Date Inactive Date L97.518 Non-pressure chronic ulcer of other part of right foot with other specified severity 07/14/2020 07/14/2020 L97.518 Non-pressure chronic ulcer of other part of right foot with other specified severity 10/24/2019 10/24/2019 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, right ankle and foot 10/24/2019 10/24/2019 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 08/10/2020 08/10/2020 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, left ankle and foot 10/24/2019 10/24/2019 L97.322 Non-pressure chronic ulcer of left ankle with fat layer exposed 09/29/2021 09/29/2021 B95.62 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 10/24/2019 10/24/2019 classified elsewhere Resolved Problems Electronic Signature(s) Signed: 03/09/2022 12:13:48 PM By: Max Maudlin MD FACS Entered By: [name] on 03/09/2022 12:13:48 Progress Note Details -------------------------------------------------------------------------------- [place], [name] ([id number]) [id number] Page 11 of 21 Patient Name: Date of Service: [name] 03/09/2022 10:30 A M Medical Record Number: [id number] Patient Account Number: [id number] Date of Birth/Sex: Treating RN: 1985-08-04 (36 y.o. 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 He comes in for review of a wound over the plantar fifth metatarsal head extending into the lateral part of the foot. He was followed for this previously by his podiatrist Dr. Cornelius [name]. As the patient tells his story he went to see podiatry first for a swelling he developed on the lateral part of his fifth metatarsal head in May. He states this was "open" by podiatry and the area closed. He was followed up in June and it was again opened callus removed and it closed promptly. There were plans being made for surgery on the fifth metatarsal head in June however his blood sugar was apparently too high for anesthesia. Apparently the area was debrided and opened again in June and it is never closed since. Looking over the records from podiatry I am really not able to follow this. It was clear when he was first seen it  was before 5/14 at that point he already had a wound. By 5/17 the ulcer was resolved. I do not see anything about a procedure. On 5/28 noted to have pre-ulcerative moderate keratosis. X-ray noted 1/5 contracted toe and tailor's bunion and metatarsal deformity. On a visit date on 09/28/2018 the dorsal part of the left foot it healed and resolved. There was concern about swelling in his lower extremity he was sent to the ER.. As far as I can tell he was seen in the ER on 7/12 with an ulcer on his left foot. A DVT rule out of the left leg was negative. I do not think I have complete records from podiatry but I am not able to verify the procedures this patient states he had. He states after the last procedure the wound has never closed although I am not able to follow this in the records I have from podiatry. He has not had a recent x-ray The patient has been using Neosporin on the wound. He is wearing a Darco shoe. He is still very active up on his foot working and exercising. Past medical  history; type 2 diabetes ketosis-prone, leg swelling with a negative DVT study in July. Non-smoker ABI in our clinic was 0.85 on the left 10/16; substantial wound on the plantar left fifth met head extending laterally almost to the dorsal fifth MTP. We have been using silver alginate we gave him a Darco forefoot off loader. An x-ray did not show evidence of osteomyelitis did note soft tissue emphysema which I think was due to gas tracking through an open wound. There is no doubt in my mind he requires an MRI 10/23; MRI not booked until 3 November at the earliest this is largely due to his glucose sensor in the right arm. We have been using silver alginate. There has been an improvement 10/29; I am still not exactly sure when his MRI is booked for. He says it is the third but it is the 10th in epic. This definitely needs to be done. He is running a low-grade fever today but no other symptoms. No real improvement in the 1 02/26/2019 patient presents today for a follow-up visit here in our clinic he is last been seen in the clinic on October 29. Subsequently we were working on getting MRI to evaluate and see what exactly was going on and where we would need to go from the standpoint of whether or not he had osteomyelitis and again what treatments were going be required. Subsequently the patient ended up being admitted to the hospital on 02/07/2019 and was discharged on 02/14/2019. This is a somewhat interesting admission with a discharge diagnosis of pneumonia due to COVID-19 although he was positive for COVID-19 when tested at the urgent care but negative x2 when he was actually in the hospital. With that being said he did have acute respiratory failure with hypoxia and it was noted he also have a left foot ulceration with osteomyelitis. With that being said he did require oxygen for his pneumonia and I level 4 L. He was placed on antivirals and steroids for the COVID-19. He was also transferred to the  [facility] at one point. Nonetheless he did subsequently discharged home and since being home has done much better in that regard. The CT angiogram did not show any pulmonary embolism. With regard to the osteomyelitis the patient was placed on vancomycin and Zosyn while in the hospital but has been changed to Augmentin at  discharge. It was also recommended that he follow- up with wound care and podiatry. Podiatry however wanted him to see [name] according to the patient prior to them doing anything further. His hemoglobin A1c was 9.9 as noted in the hospital. Have an MRI of the left foot performed while in the hospital on 02/04/2019. 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 There is an overlying plantar open wound noted an abscess tracking back along the lateral aspect of the fifth metatarsal shaft. There is otherwise diffuse cellulitis and mild fasciitis without findings of polymyositis. The patient did have recently pneumonia secondary to COVID-19 I looked in the chart through epic and it does appear that the patient may need to have an additional x-ray just to ensure everything is cleared and that he has no airspace disease prior to putting him into the [name]. 03/05/2019; patient was readmitted to the clinic last week. He was hospitalized twice for a viral upper respiratory tract infection from 11/1 through 11/4 and then 11/5 through 11/12 ultimately this turned out to be Covid pneumonitis. Although he was discharged on oxygen he is not using it. He says he feels fine. He has no exercise limitation no cough no sputum. His O2 sat in our clinic today was 100% on room air. 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 He received Vanco and Zosyn in the hospital and then was discharged on 2  weeks of Augmentin. I do not see any relevant cultures. He was supposed to follow-up with infectious disease but I do not see that he has an appointment. 12/8; patient saw Dr. Novella Olive of infectious disease last week. He felt that he had had adequate antibiotic therapy. He did not go to follow-up with Dr. Amalia Hailey of podiatry and I have again talked to him about the pros and cons of this. He does not want to consider a ray amputation of this time. He is aware of the risks of recurrence, migration etc. He started HBO today and tolerated this well. He can complete the Augmentin that I gave him last week. I have looked over the lab work that Dr. Chana Bode ordered his C-reactive protein was 3.3 and his sedimentation rate was 17. The C-reactive protein is never really been measurably that high in this patient 12/15; not much change in the wound today however he has undermining along the lateral part of the foot again more extensively than last week. He has some rims of epithelialization. We have been using silver alginate. He is undergoing hyperbarics but did not dive today 12/18; in for his obligatory first total contact cast change. Unfortunately there was pus coming from the undermining area around his fifth metatarsal head. This was cultured but will preclude reapplication of a cast. He is seen in conjunction with HBO 12/24; patient had staph lugdunensis in the wound in the undermining area laterally last time. We put him on doxycycline which should have covered this. The wound looks better today. I am going to give him another week of doxycycline before reattempting the total contact cast 12/31; the patient is completing antibiotics. Hemorrhagic debris in the distal part of the wound with some undermining distally. He also had hyper granulation. Extensive debridement with a #5 curette. The infected area that was on the lateral part of the fifth met head is  closed over. I do not think he needs any  more antibiotics. Patient was seen prior to HBO. Preparations for a total contact cast were made in the cast will be placed post hyperbarics 04/11/19; once again the patient arrives today without complaint. He had been in a cast all week noted that he had heavy drainage this week. This resulted in large raised areas of macerated tissue around the wound 1/14; wound bed looks better slightly smaller. Hydrofera Blue has been changing himself. He had a heavy drainage last week which caused a lot of maceration around the wound so I took him out of a total contact cast he says the drainage is actually better this week He is seen today in conjunction with HBO Vincelette, [name] ([id number]) [id number] Page 12 of 21 1/21; returns to clinic. He was up in [place] for a day or 2 attending a funeral. He comes back in with the wound larger and with a large area of exposed bone. He had osteomyelitis and septic arthritis of the fifth left metatarsal head while he was in hospital. He received IV antibiotics in the hospital for a prolonged period of time then 3 weeks of Augmentin. Subsequently I gave him 2 weeks of doxycycline for more superficial wound infection. When I saw this last week the wound was smaller the surface of the wound looks satisfactory. 1/28; patient missed hyperbarics today. Bone biopsy I did last time showed Enterococcus faecalis and Staphylococcus lugdunensis . He has a wide area of exposed bone. We are going to use silver alginate as of today. I had another ethical discussion with the patient. 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 In this situation I think the likelihood of healing this is low. Therefore I have recommended a ray amputation and with the patient's agreement I have referred him to Dr. Doran Durand. The other issue is that his compliance with hyperbarics has been minimal because of his work schedule and given his underlying  decision I am going to stop this today READMISSION 10/24/2019 MRI 09/29/2019 left foot IMPRESSION: 1. Apparent skin ulceration inferior and lateral to the 5th metatarsal base with underlying heterogeneous T2 signal and enhancement in the subcutaneous fat. Small peripherally enhancing fluid collections along the plantar and lateral aspects of the 5th metatarsal base suspicious for abscesses. 2. Interval amputation through the mid 5th metatarsal with nonspecific low-level marrow edema and enhancement. Given the proximity to the adjacent soft tissue inflammatory changes, osteomyelitis cannot be excluded. 3. The additional bones appear unremarkable. MRI 09/29/2019 right foot IMPRESSION: 1. Soft tissue ulceration lateral to the 5th MTP joint. There is low-level T2 hyperintensity within the 4th and 5th metatarsal heads and adjacent proximal phalanges without abnormal T1 signal or cortical destruction. These findings are nonspecific and could be seen with early marrow edema, hyperemia or early osteomyelitis. No evidence of septic joint. 2. Mild tenosynovitis and synovial enhancement associated with the extensor digitorum tendons at the level of the midfoot. 3. Diffuse low-level muscular T2 hyperintensity and enhancement, most consistent with diabetic myopathy. LEFT FOOT BONE Methicillin resistant staphylococcus aureus Staphylococcus lugdunensis MIC MIC CIPROFLOXACIN >=8 RESISTANT Resistant <=0.5 SENSI... Sensitive CLINDAMYCIN <=0.25 SENS... Sensitive >=8 RESISTANT Resistant ERYTHROMYCIN >=8 RESISTANT Resistant >=8 RESISTANT Resistant GENTAMICIN <=0.5 SENSI... Sensitive <=0.5 SENSI... Sensitive Inducible Clindamycin NEGATIVE Sensitive NEGATIVE Sensitive OXACILLIN >=4 RESISTANT Resistant 2 SENSITIVE Sensitive RIFAMPIN <=0.5 SENSI... Sensitive <=0.5 SENSI... Sensitive TETRACYCLINE <=1 SENSITIVE Sensitive <=1 SENSITIVE Sensitive TRIMETH/SULFA <=10 SENSIT Sensitive <=10 SENSIT Sensitive ...  Marland Kitchen.. VANCOMYCIN  1 SENSITIVE Sensitive <=0.5 SENSI... Sensitive Right foot bone . Component 3 wk ago Specimen Description BONE Special Requests RIGHT 4 METATARSAL SAMPLE B Gram Stain NO WBC SEEN NO ORGANISMS SEEN Culture RARE METHICILLIN RESISTANT STAPHYLOCOCCUS AUREUS NO ANAEROBES ISOLATED Performed at [facility], [place] [address] [place], [place] [address] Report Status 10/08/2019 FINAL Organism ID, Bacteria METHICILLIN RESISTANT STAPHYLOCOCCUS AUREUS Resulting Agency [facility] Susceptibility Methicillin resistant staphylococcus aureus MIC CIPROFLOXACIN >=8 RESISTANT Resistant CLINDAMYCIN <=0.25 SENS... Sensitive ERYTHROMYCIN >=8 RESISTANT Resistant GENTAMICIN <=0.5 SENSI... Sensitive Inducible Clindamycin NEGATIVE Sensitive OXACILLIN >=4 RESISTANT Resistant RIFAMPIN <=0.5 SENSI... Sensitive TETRACYCLINE <=1 SENSITIVE Sensitive TRIMETH/SULFA <=10 SENSIT Sensitive ... VANCOMYCIN 1 SENSITIVE Sensitive This is a patient we had in clinic earlier this year with a wound over his left fifth metatarsal head. 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 Doran Durand on 05/16/2019. He seems to have developed problems with wounds on his bilateral feet in June 2021 although he may have had problems earlier than this. He was in an urgent care with a right Packwood, [name] ([id number]) [id number] Page 13 of 21 foot ulcer on 09/26/2019 and given a course of doxycycline. This was apparently after having trouble getting into see orthopedics. He was seen by podiatry on 09/28/2019 noted to have bilateral lower extremity ulcers including the left lateral fifth metatarsal base and the right subfifth  met head. It was noted that had purulent drainage at that time. He required hospitalization from 6/20 through 7/2. This was because of worsening right foot wounds. He underwent bilateral operative incision and drainage and bone biopsies bilaterally. Culture results are listed above. He has been referred back to clinic by Dr. Jacqualyn Posey of podiatry. He is also followed by Dr. Megan Salon who saw him yesterday. He was discharged from hospital on Zyvox Flagyl and Levaquin and yesterday changed to doxycycline Flagyl and Levaquin. His inflammatory markers on 6/26 showed a sedimentation rate of 129 and a C-reactive protein of 5. This is improved to 14 and 1.3 respectively. This would indicate improvement. ABIs in our clinic today were 1.23 on the right and 1.20 on the left 11/01/2019 on evaluation today patient appears to be doing fairly well in regard to the wounds on his feet at this point. Fortunately there is no signs of active infection at this time. No fevers, chills, nausea, vomiting, or diarrhea. He currently is seeing infectious disease and still under their care at this point. Subsequently he also has both wounds which she has not been using collagen on as he did not receive that in his packaging he did not call us and let us know that. Apparently that just was missed on the order. Nonetheless we will get that straightened out today. 8/9-Patient returns for bilateral foot wounds, using Prisma with hydrogel moistened dressings, and the wounds appear stable. Patient using surgical shoes, avoiding much pressure or weightbearing as much as possible 8/16; patient has bilateral foot wounds. 1 on the right lateral foot proximally the other is on the left mid lateral foot. Both required debridement of callus and thick skin around the wounds. We have been using silver collagen 8/27; patient has bilateral lateral foot wounds. The area on the left substantially surrounded by callus and dry skin. This was removed  from the wound edge. The underlying wound is small. The area on the right measured somewhat smaller today. 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 Unfortunately I did not update his antibiotics during today's visit. 9/10 I reviewed Dr. Hale Bogus last notes he felt he had completed antibiotics his inflammatory markers were reasonably well controlled. He has a small wound on the lateral left foot and a tiny area on the right which is just above closed. He is using Hydrofera Blue with border foam he has bilateral surgical shoes 9/24; 2 week f/u. doing well. right foot is closed. left foot still undermined. 10/14; right foot remains closed at the fifth met head. The area over the base of the left fifth metatarsal has a small open area but considerable undermining towards the plantar foot. Thick callus skin around this suggests an adequate pressure relief. We have talked about this. He says he is going to go back into his cam boot. I suggested a total contact cast he did not seem enamored with this suggestion 10/26; left foot base of the fifth metatarsal. Same condition as last time. He has skin over the area with an open wound however the skin is not adherent. He went to see Dr. Earleen Newport who did an x-ray and culture of his foot I have not reviewed the x-ray but the patient was not told anything. He is on doxycycline 11/11; since the patient was last here he was in the emergency room on 10/30 he was concerned about swelling in the left foot. They did not do any cultures or x-rays. They changed his antibiotics to cephalexin. Previous culture showed group B strep. The cephalexin is appropriate as doxycycline has less than predictable coverage. Arrives in clinic today with swelling over this area under the wound. He also has a new wound on the right fifth metatarsal head 11/18; the patient has a difficult wound on the lateral aspect of the  left fifth metatarsal head. The wound was almost ballotable last week I opened it slightly expecting to see purulence however there was just bleeding. I cultured this this was negative. X-ray unchanged. We are trying to get an MRI but I am not sure were going to be able to get this through his insurance. He also has an area on the right lateral fifth metatarsal head this looks healthier 12/3; the patient finally got our MRI. Surprisingly this did not show osteomyelitis. I did show the soft tissue ulceration at the lateral plantar aspect of the fifth metatarsal base with a tiny residual 6 mm abscess overlying the superficial fascia I have tried to culture this area I have not been able to get this to grow anything. Nevertheless the protruding tissue looks aggravated. I suspect we should try to treat the underlying "abscess with broad-spectrum antibiotics. I am going to start him on Levaquin and Flagyl. He has much less edema in his legs and I am going to continue to wrap his legs and see him weekly 12/10. I started Levaquin and Flagyl on him last week. He just picked up the Flagyl apparently there was some delay. The worry is the wound on the left fifth metatarsal base which is substantial and worsening. His foot looks like he inverts at the ankle making this a weightbearing surface. Certainly no improvement in fact I think the measurements of this are somewhat worse. We have been using 12/17; he apparently just got the Levaquin yesterday this is  2 weeks after the fact. He has completed the Flagyl. The area over the left fifth metatarsal base still has protruding granulation tissue although it does not look quite as bad as it did some weeks ago. He has severe bilateral lymphedema although we have not been treating him for wounds on his legs this is definitely going to require compression. There was so much edema in the left I did not wish to put him in a total contact cast today. I am going to increase  his compression from 3-4 layer. The area on the right lateral fifth met head actually look quite good and superficial. 12/23; patient arrived with callus on the right fifth met head and the substantial hyper granulated callused wound on the base of his fifth metatarsal. He says he is completing his Levaquin in 2 days but I do not think that adds up with what I gave him but I will have to double check this. We are using Hydrofera Blue on both areas. My plan is to put the left leg in a cast the week after New Year's 04/06/2020; patient's wounds about the same. Right lateral fifth metatarsal head and left lateral foot over the base of the fifth metatarsal. There is undermining on the left lateral foot which I removed before application of total contact cast continuing with Hydrofera Blue new. Patient tells me he was seen by endocrinology today lab work was done [Dr. Kerr]. Also wondering whether he was referred to cardiology. I went over some lab work from previously does not have chronic renal failure certainly not nephrotic range proteinuria he does have very poorly controlled diabetes but this is not his most updated lab work. Hemoglobin A1c has been over 11 1/10; the patient had a considerable amount of leakage towards mid part of his left foot with macerated skin however the wound surface looks better the area on the right lateral fifth met head is better as well. I am going to change the dressing on the left foot under the total contact cast to silver alginate, continue with Hydrofera Blue on the right. 1/20; patient was in the total contact cast for 10 days. Considerable amount of drainage although the skin around the wound does not look too bad on the left foot. The area on the right fifth metatarsal head is closed. Our nursing staff reports large amount of drainage out of the left lateral foot wound 1/25; continues with copious amounts of drainage described by our intake staff. PCR culture I did  last week showed E. coli and Enterococcus faecalis and low quantities. Multiple resistance genes documented including extended spectrum beta lactamase, MRSA, MRSE, quinolone, tetracycline. The wound is not quite as good this week as it was 5 days ago but about the same size 2/3; continues with copious amounts of malodorous drainage per our intake nurse. The PCR culture I did 2 weeks ago showed E. coli and low quantities of Enterococcus. There were multiple resistance genes detected. I put Neosporin on him last week although this does not seem to have helped. The wound is slightly deeper today. Offloading continues to be an issue here although with the amount of drainage she has a total contact cast is just not going to work 2/10; moderate amount of drainage. Patient reports he cannot get his stocking on over the dressing. I told him we have to do that the nurse gave him suggestions on how to make this work. The wound is on the bottom and lateral part  of his left foot. Is cultured predominantly grew low amounts of Enterococcus, E. coli and anaerobes. There were multiple resistance genes detected including extended spectrum beta lactamase, quinolone, tetracycline. I could not think of an easy oral combination to address this so for now I am going to do topical antibiotics provided by [facility] I think the main agents here are vancomycin and an aminoglycoside. We have to be able to give him access to the wounds to get the topical antibiotic on 2/17; moderate amount of drainage this is unchanged. He has his Keystone topical antibiotic against the deep tissue culture organisms. He has been using this and changing the dressing daily. Silver alginate on the wound surface. 2/24; using Keystone antibiotic with silver alginate on the top. He had too much drainage for a total contact cast at one point although I think that is improving and I think in the next week or 2 it might be possible to replace a total  contact cast I did not do this today. In general the wound surface looks healthy however he continues to have thick rims of skin and subcutaneous tissue around the wide area of the circumference which I debrided 06/04/2020 upon evaluation today patient appears to be doing well in regard to his wound. I do feel like he is showing signs of improvement. There is little bit of callus and dead tissue around the edges of the wound as well as what appears to be a little bit of a sinus tract that is off to the side laterally I would perform debridement to clear that away today. 3/17; left lateral foot. The wound looks about the same as I remember. Not much depth surface looks healthy. No evidence of infection 3/25; left lateral foot. Wound surface looks about the same. Separating epithelium from the circumference. There really is no evidence of infection here however not making progress by my view 3/29; left lateral foot. Surface of the wound again looks reasonably healthy still thick skin and subcutaneous tissue around the wound margins. There is no evidence of infection. Copenhaver, [name] ([id number]) [id number] Page 14 of 21 One of the concerns being brought up by the nurses has again the amount of drainage vis--vis continued use of a total contact cast 4/5; left lateral foot at roughly the base of the fifth metatarsal. Nice healthy looking granulated tissue with rims of epithelialization. The overall wound measurements are not any better but the tissue looks healthy. The only concern is the amount of drainage although he has no surrounding maceration with what we have been doing recently to absorb fluid and protect his skin. He also has lymphedema. 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 He states that the kerlix/coban under the TCC rolled down and could not get it back up. He has been using an offloading scooter and has somehow hurt his right foot using this device. This happened last week. He states that the side of his right foot developed a blister and opened. The top of his foot also has a few small open wounds he thinks is due to his socks rubbing in his shoes. He has not been using any dressings to the wound. He denies purulent drainage, fever/chills or erythema to the  wounds. 4/22; patient presents for 1 week follow-up. He developed new wounds to the right foot that were evaluated at last clinic visit. He continues to have a total contact cast to the left leg and he reports no issues. He has been using silver collagen to the right foot wounds with no issues. He denies purulent drainage, fever/chills or erythema to the right foot wounds. He has no complaints today 4/25; patient presents for 1 week follow-up. He has a total contact cast of the left leg and reports no issues. He has been using silver alginate to the right foot wound. He denies purulent drainage, fever/chills or erythema to the right foot wounds. 5/2 patient presents for 1 week follow-up. T contact cast on the left. The wound which is on the base of the plantar foot at the base of the fifth metatarsal otal actually looks quite good and dimensions continue to gradually contract. HOWEVER the area on the right lateral fifth metatarsal head is much larger than what I remember from 2 weeks ago. Once more is he has significant levels of hypergranulation. Noteworthy that he had this same hyper granulated response on his wound on the left foot at one point in time. So much so that he I thought there was an underlying fluid collection. Based on this I think this just needs debridement. 5/9; the wound on the left actually continues to be gradually smaller with a  healthy surface. Slight amount of drainage and maceration of the skin around but not too bad. However he has a large wound over the right fifth metatarsal head very much in the same configuration as his left foot wound was initially. I used silver nitrate to address the hyper granulated tissue no mechanical debridement 5/16; area on the left foot did not look as healthy this week deeper thick surrounding macerated skin and subcutaneous tissue. oo The area on the right foot fifth met head was about the same oo The area on the right ankle that we identified last week is completely broken down into an open wound presumably a stocking rubbing issue 5/23; patient has been using a total contact cast to the left side. He has been using silver alginate underneath. He has also been using silver alginate to the right foot wounds. He has no complaints today. He denies any signs of infection. 5/31; the left-sided wound looks some better measure smaller surface granulation looks better. We have been using silver alginate under the total contact cast oo The large area on his right fifth met head and right dorsal foot look about the same still using silver alginate 6/6; neither side is good as I was hoping although the surface area dimensions are better. A lot of maceration on his left and right foot around the wound edge. Area on the dorsal right foot looks better. He says he was traveling. I am not sure what does the amount of maceration around the plantar wounds may be drainage issues 6/13; in general the wound surfaces look quite good on both sides. Macerated skin and raised edges around the wound required debridement although in general especially on the left the surface area seems improved. oo The area on the right dorsal ankle is about the same I thought this would not be such a problem to close 6/20; not much change in either wound although the one on the right looks a little better. Both wounds have  thick macerated edges to the skin requiring debridements. We have been using silver  alginate. The area on the dorsal right ankle is still open I thought this would be closed. 6/28; patient comes in today with a marked deterioration in the right foot wound fifth met head. Wide area of exposed bone this is a drastic change from last time. The area on the left there we have been casting is stagnant. We have been using silver alginate in both wound areas. 7/5; bone culture I did for PCR last time was positive for Pseudomonas, group B strep, Enterococcus and Staph aureus. There was no suggestion of methicillin resistance or ampicillin resistant genes. This was resistant to tetracycline however He comes into the clinic today with the area over his right plantar fifth metatarsal head which had been doing so well 2 weeks ago completely necrotic feeling bone. I do not know that this is going to be salvageable. The left foot wound is certainly no smaller but it has a better surface and is superficial. 7/8; patient called in this morning to say that his total contact cast was rubbing against his foot. He states he is doing fine overall. He denies signs of infection. 7/12; continued deterioration in the wound over the right fifth metatarsal head crumbling bone. This is not going to be salvageable. The patient agrees and wants to be referred to Dr. Doran Durand which we will attempt to arrange as soon as possible. I am going to continue him on antibiotics as long as that takes so I will renew those today. The area on the left foot which is the base of the fifth metatarsal continues to look somewhat better. Healthy looking tissue no depth no debridement is necessary here. 7/20; the patient was kindly seen by Dr. Doran Durand of orthopedics on 10/19/2020. 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 Towards this end we have taken him  out of the total contact cast on the left we will put him in a wrap with Hydrofera Blue. As I understand things surgery is planned for 7/21 7/27; patient had his surgery last Thursday. 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 Apparently everything went well we did not still disturb that today The area on the left foot actually looks quite good. He has been much less mobile which probably explains this he did not seem to do well in the total contact cast secondary to drainage and maceration I think. 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 Fortunately there is no evidence of active infection at this time. No fevers, chills, nausea, vomiting, or diarrhea. Overall I think that he is actually doing extremely well. Nonetheless I do believe that he is staying off of this more following the surgery in his right foot that is the reason the left is doing so great. 8/16; left plantar foot wound. This looks smaller than the last time I saw this he is using Hydrofera Blue. The surgical wound on the right foot is being followed by Dr. Doran Durand we did not look at this today. He has surgical shoes on both feet 8/23; left plantar foot wound not as good this week. Surrounding macerated skin and subcutaneous tissue everything looks moist and wet. I do not think he is offloading this adequately. He is using a surgical shoe Apparently the right foot surgical wound is not open  although I did not check his foot 8/31; left plantar foot lateral aspect. Much improved this week. He has no maceration. Some improvement in the surface area of the wound but most impressively the depth is come in we are using silver alginate. The patient is a Product/process development scientist. He is asked that we write him a letter so he can go back to work. I have also tried to see if we can write something that will allow him to limit the amount  of time that he is on his foot at work. 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 Hopefully the latter can be avoided 9/6; I believe the patient missed an appointment last week. He arrives in today with a wound looking roughly the same certainly no better. Undermining laterally and also inferiorly. We used molecuLight today in training with the patient's permission.. We are using silver alginate 9/21 wound is measuring bigger this week although this may have to do with the aggressive circumferential debridement last week in response to the blush fluorescence on the MolecuLight. Culture I did last week showed significant MSSA and E. coli. I put him on Augmentin but he has not started it yet. We are Mccready, [name] ([id number]) [id number] Page 15 of 21 also going to send this for compounded antibiotics at [facility]. There is no evidence of systemic infection 9/29; silver alginate. His Keystone arrived. He is completing Augmentin in 2 days. Offloading in a cam boot. Moderate drainage per our intake staff 10/5; using silver alginate. He has been using his [place]. He has completed his Augmentin. Per our intake nurse still a lot of drainage, far too much to consider a total contact cast. Wound measures about the same. He had the same undermining area that I defined last week from a roughly 11-3. I remove this today 10/12; using silver alginate he is using the [place]. He comes in for a nurse visit hence we are applying [employer] twice a week. Measuring slightly better today and less notable drainage. Extensive debridement of the wound edge last time 10/18; using topical Keystone and silver alginate and a soft cast. Wound measurements about the same. Drainage was through his soft cast. We are changing this twice a week Tuesdays and Friday 10/25; comes in with moderate drainage. Still using Keystone  silver alginate and a soft cast. Wound dimensions completely the same.He has a lot of edema in the left leg he has lymphedema. Asking for [name] to consider wrapping him as he cannot get his stocking on over the soft cast 11/2; comes in with moderate to large drainage slightly smaller in terms of width we have been using Octa. His wound looks satisfactory but not much improvement 11/4; patient presents today for obligatory cast change. Has no issues or complaints today. He denies signs of infection. 11/9; patient traveled this weekend to [place], was on the cast quite a bit. Staining of the cast with black material from his walking boot. Drainage was not quite as bad as we feared. Using silver alginate and Keystone 11/16; we do not have size for cast therefore we have been putting a soft cast on him since the change on Friday. Still a significant amount of drainage necessitating changing twice a week. We have been using the Keystone at cast changes either hard or soft as well as silver alginate Comes in the clinic with things actually looking fairly good improvement  in width. He says his offloading is about the same 02/24/2021 upon evaluation today patient actually comes back in and is doing excellent in regard to his foot ulcer this is significantly smaller even compared to the last visit. The soft cast seems to have done extremely well for him which is great news. I do not see any signs of infection minimal debridement will be needed today. 11/30; left lateral foot much improved half a centimeter improvement in surface area. No evidence of infection. He seems to be doing better with the soft cast in the TCC therefore we will continue with this. He comes back in later in the week for a change with the nurses. This is due to drainage 12/6; no improvement in dimensions. Under illumination some debris on the surface we have been using silver alginate, soft cast. If there is anything optimistic here he seems  to have have less drainage 12/13. Dimensions are improved both length and width and slightly in depth. Appears to be quite healthy today. Raised edges of this thick skin and callus around the edges however. He is in a soft cast were bringing him back once for a change on Friday. Drainage is better 12/20. Dimensions are improved. He still has raised edges of thick skin and callus around the edges. We are using a soft cast 12/28; comes in today with thick callus around the wound. Using silver under alginate under a soft cast. I do not think there is much improvement in any measurement 2023 04/06/2021; patient was put in a total contact cast. Unfortunately not much change in surface area 1/10; not much different still thick callus and skin around the edge in spite of the total contact cast. This was just debrided last week we have been using the [facility] compounded antibiotic and silver alginate under a total contact cast 1/18 the patient's wound on the left side is doing nicely. smaller HOWEVER he comes in today with a wound on the right foot laterally. blister most likely serosangquenous drainage 1/24; the patient continues to do well in terms of the plantar left foot which is continued to contract using silver alginate under the total contact cast HOWEVER the right lateral foot is bigger with denuded skin around the edges. I used pickups and a #15 scalpel to remove this this looks like the remanence of a large blister. Cannot rule out infection. Culture in this area I did last week showed Staphylococcus lugdunensis few colonies. I am going to try to address this with his [employer] antibiotic that is done so well on the left having linezolid and this should cover the staph 2/1; the patient's wound on his left foot which was the original plantar foot wound thick skin and eschar around the edges even in the total contact cast but the wound surface does not look too bad The real problem is on how his  right lateral foot at roughly the base of the fifth metatarsal. The wound is completely necrotic more worrisome than that there is swelling around the edges of this. We have been using silver alginate on both wounds and Keystone on the right foot. Unfortunately I think he is going to require systemic antibiotics while we await cultures. He did not get the x-ray done that we ordered last week [lost the prescription 2/7; disappointingly in the area on the left foot which we are treating with a total contact cast is still not closed although it is much smaller. He continues to have a lot  of callus around the wound edge. -Right lateral foot culture I did last week was negative x-ray also negative for osteomyelitis. 2/15: TCC silver alginate on the left and silver alginate on the right lateral. No real improvement in either area 05/26/2021: T oday, the wounds are roughly the same size as at his previous visit, post-debridement. He continues to endorse fairly substantial drainage, particularly on the right. He has been in a total contact cast on the left. There is still some callus surrounding this lesion. On the right, the periwound skin is quite macerated, along with surrounding callus. The center of the right-sided wound also has some dark, densely adherent material, which is very difficult to remove. 06/02/2021: Today, both wounds are slightly smaller. He has been using zinc oxide ointment around the right ulcer and the degree of maceration has improved markedly. There continues to be an area of nonviable tissue in the center of the right sided ulcer. The left-sided wound, which has been in the total contact cast. Appears clean and the degree of callus around it is less than previously. 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 He had to take the stairs and both wounds have increased in size. The left foot, which has been in a total contact  cast, has developed a tunnel tracking to the lateral aspect of his foot. The nonviable tissue in the center of the right-sided ulcer remains recalcitrant to debridement. There is significant undermining surrounding the entirety of the left sided wound. 06/16/2021: The elevator at school has been fixed and the patient has been able to avoid putting as much weight on his wounds over the past week. We converted the left foot wound into a single lesion today, but despite this, the wound is actually smaller. The base is healthy with limited periwound callus. On the right, the central necrotic area is still present. He continues to be quite macerated around the right sided wound, despite applying barrier cream. This does, however, have the benefit of softening the callus to make it more easily removable. 06/23/2021: Today, the left wound is smaller. The lateral aspect that had opened up previously is now closed. The wound base has a healthy bed of granulation tissue and minimal slough. Unfortunately, on the right, the wound is larger and continues to be fairly macerated. He has also reopened the wound at his right ankle. He thinks this is due to the gait he has adopted secondary to his total contact cast and boot. 06/30/2021: T oday, both wounds are a little bit larger. The lateral aspect on the left has remained closed. He continues to have significant periwound maceration. The culture that I took from the right sided wound grew a population of bacteria that is not covered by his current [facility] antibiotic. The center of the right- sided wound continues to appear necrotic with nonviable fat. It probes deeper today, but does not reach bone. 07/07/2021: The periwound maceration is a little bit less today. The right lateral foot wound has some areas that appear more viable and the necrotic center also looks a little bit better. The wound on the dorsal surface of his right foot near the ankle is contracting and the  surface appears healthy. The left plantar wound Wandersee, [name] ([id number]) [id number] Page 16 of 21 surface looks healthy, but there is some new undermining on the medial portion. He did get his new Keystone antibiotic and began applying that to the right  foot wound on Saturday. 07/14/2021: The intake nurse reported substantial drainage from his wounds, but the periwound skin actually looks better than is typical for him. The wound on the dorsal surface of his right foot near the ankle is smaller and just has a small open area underneath some dried eschar. The left plantar wound surface looks healthy and there has been no significant accumulation of callus. The right lateral foot wound looks quite a bit better, with the central portion, which typically appears necrotic, looking more viable albeit pale. 07/22/2021: His left foot is extremely macerated today. The wound is about the same size. The wound on the dorsal surface of his right foot near the ankle had closed, but he traumatized it removing the dressing and there is a tiny skin tear in that location. The right lateral foot wound is bigger, but the surface appears healthy. 07/30/2021: The wound on the dorsal surface of his right foot near the ankle is closed. The right lateral foot wound again is a little bit bigger due to some undermining. The periwound skin is in better condition, however. He has been applying zinc oxide. The wound surface is a little bit dry today. On the left, he does not have the substantial maceration that we frequently see. The wound itself is smaller and has a clean surface. 08/06/2021: Both wounds seem to have deteriorated over the past week. The right lateral foot wound has a dry surface but the periwound is boggy.. Overall wound dimensions are about the same. On the left, the wound is about the same size, but there is more undermining present underneath periwound callus. 08/13/2021: The right  sided wound looks about the same, but on the left there has been substantial deterioration. The undermining continues to extend under periwound callus. Once this was removed, substantial extension of the wound was present. There is no odor or purulent drainage but clearly the wounds have broken down. 08/20/2021: The wounds look about the same today. He has been out of his total contact cast and has just been changing the dressings using topical Keystone with PolyMem Ag, Kerlix and Ace bandages. The wound on the top of his right ankle has reopened but this is quite small. There was a little bit of purulent material that I expressed when examining this wound. 08/24/2021: After the aggressive debridement I performed at his last visit, the wounds actually look a little bit better today. They are smaller with the exception of the wound on the top of his right ankle which is a little bit bigger as some more skin pulled off when he was changing his dressing. We are using topical Keystone with PolyMem Ag Kerlix and Ace bandages. 09/02/2021: There has been really no change to any of his wounds. 09/16/2021: The patient was hospitalized last week with nausea, vomiting, and dehydration. He says that while he was in the hospital, his wounds were not really addressed properly. T oday, both plantar foot wounds are larger and the periwound skin is macerated. The wound on the dorsum of his right foot has a scab on the top. The right foot now has a crater where previously he had had nonviable fat. It looks as though this simply died and fell out. The periwound callus is wet. 09/24/2021: His wounds have deteriorated somewhat since his last visit. The wound on the dorsum of his right foot near his ankle is larger and has more nonviable tissue present. The crater in his right foot is even deeper; I  cannot quite palpate or probe to bone but I am sure it is close. The wound on his left plantar foot has an odd boggy area in the  center that almost feels as though it has fluid within it. He has run out of his topical Keystone antibiotic. We are using silver alginate on his wounds. 09/29/2021: He has developed a new wound on the dorsum of his left foot near his ankle. He says he thinks his wrapping is rubbing in that site. I would concur with this as the wound on his right ankle is larger. The left foot looks about the same. The right foot has the crater that was present last week. No significant slough accumulation, but his foot remains quite swollen and warm despite oral antibiotic therapy. 10/08/2021: All of his wounds look about the same as last week. He did not start his oral antibiotics that are prescribed until just a couple of days ago; his [employer] compounded antibiotics formula has been changed and he is awaiting delivery of the new recipe. His MRI that was scheduled for earlier this week was canceled as no prior authorization had been obtained; unfortunately the tech responsible sent an email to my old [employer] email, which I no longer use nor have access to. 10/18/2021: The wounds on his bilateral dorsal feet near the ankles are both improved. They are smaller and have just some eschar and slough buildup. The left plantar wound has a fair amount of undermining, but the surface is clean. There is some periwound callus accumulation. On the right plantar foot, there is nonviable fat leading to a deep tunnel that tracks towards his dorsal medial foot. There is periwound callus and slough accumulation, as well. His right foot and leg remain swollen as compared to the left. 10/25/2021: The wounds on his bilateral dorsal feet and at the ankles have broken down somewhat. They are little bit larger than last week. The left plantar wound continues to undermine laterally but the surface is clean. The right plantar foot wound shows some decreased depth in the tunnel tracking towards his dorsal medial foot. He has not yet had the  Doppler study that I ordered; it sounds like there is some confusion about the scheduling of the procedure. In addition, the MRI was denied and I have taken steps to appeal the denial. 11/24/2021: Since our last visit, Mr. Castilleja was admitted to the hospital where an MRI suggested osteomyelitis. He was taken the operating room by podiatry. Bone biopsies were negative for osteomyelitis. They debrided his wounds and applied myriad matrix. He saw them last week and they removed his staples. He is here today to continue his wound healing process. T oday, both of the dorsal foot/ankle wounds are substantially smaller. There is just a little eschar overlying the left sided wound and some eschar and slough on the right. The right plantar foot ulcer has the healthiest surface of granulation tissue that I have seen to date. A portion of the myriad matrix failed to take and was hanging loose. It appears that myriad morcells were placed into the tunnel closest to the dorsal portion of his foot. These have sloughed off. The left plantar foot ulcer is about the same size, but has a much healthier surface than in the past. Both plantar ulcers have callus and slough accumulation. 12/07/2021: Left dorsal foot/ankle wound is closed. The right dorsal foot/ankle wound is nearly closed and just has a small open area with some eschar and slough. The  right plantar foot wound has contracted quite a bit since our last visit. It has a healthy surface with just a little bit of slough accumulation and periwound callus buildup. The left plantar foot wound is about the same size but the surface appears healthy. There is a little slough and periwound callus on this side, as well. 12/15/2021: Both dorsal foot/ankle wounds are closed. The right plantar foot wound is substantially smaller than at our last visit. The tunneling that was present has nearly closed. There is just a little bit of slough buildup. The left plantar foot wound  is also a little bit smaller today. The surface is the healthiest that I have ever seen it. Light slough and periwound callus accumulation on this side. 12/22/2021: The dorsal ankle/foot wounds remain closed. The right plantar foot wound continues to contract. There is still a bit of depth at the lateral portion of the wound but the surface has a good granulated appearance. The wound on the left is about the same size, the central indented portion is still adherent. It also is clean with good granulation tissue present. The periwound skin and callus are a bit macerated, however. 12/29/2021: Both ankle wounds remain closed. The right plantar foot wound is less than half the size that it was last week. There is no depth any longer and the surface has just a little bit of slough and periwound callus. On the left, the wound is not much smaller, but the surface is healthier with good granulation tissue. There is also a little slough and periwound callus buildup. 01/05/2022: The right plantar foot wound continues to contract and is once again about half the size as it was last week. There is just a little bit of surface slough and periwound callus. On the left, the depth of the wound has decreased and the diameter has started to contract. It is also very clean with just a little slough and biofilm present. 01/12/2022: The right plantar foot wound is smaller again today. There is just some periwound callus and slough accumulation. On the left, there is a fair amount of undermining and the surface is a little boggy, but no other significant change. Gravette, [name] ([id number]) [id number] Page 17 of 21 01/19/2022: The right plantar wound continues to contract. There is minimal periwound callus accumulation and just a light layer of slough on the surface. On the left, the surface looks better, but there is fairly significant undermining near the 11:00 portion of the wound, aiming  towards his great toe. The skin overlying this area is very healthy and has a good fat layer present. No malodor or purulent drainage. 01/26/2022: The right sided wound continues to contract and has just a light layer of slough on the surface. Minimal periwound callus. On the left, he still has substantial undermining and the tissue surface is not as robust as I would like to see. No malodor or purulent drainage, but he did say he had a lot of serous drainage over the weekend. 02/02/2022: The right foot wound is about a quarter of the size that it was at his last visit. There is just a little slough on the surface and some periwound callus. On the left, the undermining persists and the tissue is still more pale, but he does not seem to have had as much drainage over the last weekend. We are waiting on a wound VAC. 02/09/2022: His right foot has healed. He received the wound VAC, but did not bring it to  clinic with him today. The lateral aspect of the left foot wound has come in a little, but he still has substantial undermining on the medial and distal portion of the wound. Still with macerated periwound callus. 02/16/2022: The wound VAC was applied last Friday. T oday, there has been tremendous improvement in the surface of the wound bed. The undermined portion of the wound has come in a bit. There is still some overhanging dead skin. Overall the wound looks much better. 02/23/2022: No significant change in the overall wound dimensions, but the wound surface continues to improve and appears healthier and more robust. There is some periwound callus accumulation and overhanging skin. No concern for infection. 03/02/2022: Although the measurements taken in clinic today were unchanged, the visual appearance of the wound looks like it is smaller and the undermining/tunneling is filling with flesh. There is less periwound tissue maceration than usual. No malodor or purulent drainage. No concern for  infection. 03/09/2022: The undermining has come in by couple of millimeters. The periwound is a little bit more macerated this week. No concern for infection. Patient History Information obtained from Patient. Family History No family history of Cancer, Diabetes, Hereditary Spherocytosis, Hypertension, Kidney Disease, Lung Disease, Seizures, Stroke, Thyroid Problems, Tuberculosis. Social History Never smoker, Marital Status - Single, Alcohol Use - Rarely, Drug Use - No History, Caffeine Use - Never. Medical History Eyes Denies history of Cataracts, Glaucoma, Optic Neuritis Ear/Nose/Mouth/Throat Denies history of Chronic sinus problems/congestion, Middle ear problems Hematologic/Lymphatic Denies history of Anemia, Hemophilia, Human Immunodeficiency Virus, Lymphedema, Sickle Cell Disease Respiratory Denies history of Aspiration, Asthma, Chronic Obstructive Pulmonary Disease (COPD), Pneumothorax, Sleep Apnea, Tuberculosis Cardiovascular Denies history of Angina, Arrhythmia, Congestive Heart Failure, Coronary Artery Disease, Deep Vein Thrombosis, Hypertension, Hypotension, Myocardial Infarction, Peripheral Arterial Disease, Peripheral Venous Disease, Phlebitis, Vasculitis Gastrointestinal Denies history of Cirrhosis , Colitis, Crohnoos, Hepatitis A, Hepatitis B, Hepatitis C Endocrine Patient has history of Type II Diabetes Denies history of Type I Diabetes Immunological Denies history of Lupus Erythematosus, Raynaudoos, Scleroderma Integumentary (Skin) Denies history of History of Burn Musculoskeletal Denies history of Gout, Rheumatoid Arthritis, Osteoarthritis, Osteomyelitis Neurologic Denies history of Dementia, Neuropathy, Quadriplegia, Paraplegia, Seizure Disorder Oncologic Denies history of Received Chemotherapy, Received Radiation Psychiatric Denies history of Anorexia/bulimia, Confinement Anxiety Hospitalization/Surgery History - 11/1-11/06/2018- sepsis foot infection. -  11/4-11/5 02 sats low respiratory distress. Objective Constitutional Slightly hypertensive. No acute distress. Vitals Time Taken: 10:38 AM, Height: 77 in, Weight: 280 lbs, BMI: 33.2, Temperature: 98.6 F, Pulse: 84 bpm, Respiratory Rate: 18 breaths/min, Blood Pressure: 145/92 mmHg, Capillary Blood Glucose: 79 mg/dl. Respiratory Normal work of breathing on room air. Funches, [name] ([id number]) [id number] Page 18 of 21 General Notes: 03/09/2022: The undermining has come in by couple of millimeters. The periwound is a little bit more macerated this week. No concern for infection. Integumentary (Hair, Skin) Wound #3 status is Open. Original cause of wound was Trauma. The date acquired was: 10/02/2019. The wound has been in treatment 123 weeks. The wound is located on the [place]. The wound measures 3.5cm length x 3cm width x 1.5cm depth; 8.247cm^2 area and 12.37cm^3 volume. There is Fat Layer (Subcutaneous Tissue) exposed. There is no tunneling or undermining noted. There is a medium amount of serosanguineous drainage noted. The wound margin is distinct with the outline attached to the wound base. There is large (67-100%) red granulation within the wound bed. There is a small (1-33%) amount of necrotic tissue within the wound bed including Adherent Slough. The periwound skin  appearance had no abnormalities noted for color. The periwound skin appearance exhibited: Callus, Maceration. Periwound temperature was noted as No Abnormality. 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 There was a Selective/Open Wound Skin/Epidermis Debridement  with a total area of 10.5 sq cm performed by Max Maudlin, MD. With the following instrument(s): Curette to remove Non-Viable tissue/material. Material removed includes Callus, Skin: Dermis, and Skin: Epidermis. No specimens were taken. A time out was conducted at 10:45, prior to the start of the procedure. A Minimum amount of bleeding was controlled with Pressure. The procedure was tolerated well with a pain level of 0 throughout and a pain level of 0 following the procedure. Post Debridement Measurements: 3.5cm length x 3cm width x 1.5cm depth; 12.37cm^3 volume. Character of Wound/Ulcer Post Debridement is improved. Severity of Tissue Post Debridement is: Fat layer exposed. Post procedure Diagnosis Wound #3: Same as Pre-Procedure General Notes: scribed for Dr [name] by Max Creamer, Rn. Plan Follow-up Appointments: Return Appointment in 1 week. - Dr. Celine [name] - Room 1 with Delice Lesch 12/13 @ 8:15 am Bathing/ Shower/ Hygiene: May shower and wash wound with soap and water. Negative Presssure Wound Therapy: Wound #3 Left,Lateral,Plantar Foot: Wound Vac to wound continuously at 188m/hg pressure Black Foam Edema Control - Lymphedema / SCD / Other: Elevate legs to the level of the heart or above for 30 minutes daily and/or when sitting, a frequency of: - throughout the day Avoid standing for long periods of time. Exercise regularly Compression stocking or Garment 20-30 mm/Hg pressure to: - to both legs daily Off-Loading: Open toe surgical shoe to: - Both feet Additional Orders / Instructions: Follow Nutritious Diet Non Wound Condition: Apply the following to affected area as directed: - continue to apply foam donut or cushion to healed right plantar foot WOUND #3: - Foot Wound Laterality: Plantar, Left, Lateral Cleanser: Soap and Water 3 x Per Week/30 Days Discharge Instructions: May shower and wash wound with dial antibacterial soap and water prior to dressing change. Peri-Wound Care:  Skin Prep 3 x Per Week/30 Days Discharge Instructions: Use skin prep as directed Prim Dressing: Promogran Prisma Matrix, 4.34 (sq in) (silver collagen) 3 x Per Week/30 Days ary Discharge Instructions: Moisten collagen with saline or hydrogel Prim Dressing: VAC 3 x Per Week/30 Days ary Com pression Wrap: Kerlix Roll 4.5x3.1 (in/yd) (Generic) 3 x Per Week/30 Days Discharge Instructions: Apply Kerlix and Coban compression as directed. 03/09/2022: The undermining has come in by couple of millimeters. The periwound is a little bit more macerated this week. No concern for infection. I used a curette to debride the macerated callus and skin from around his wound. We will continue to put the Prisma silver collagen on the wound surface and continue the wound VAC. Follow-up in 1 week. Everard, CMali([id number] Page 19 of 21 Electronic Signature(s) Signed: 03/09/2022 12:16:59 PM By: CFredirick MaudlinMD FACS Entered By: CFredirick Maudlinon 03/09/2022 12:16:59 -------------------------------------------------------------------------------- HxROS Details Patient Name: Date of Service: ALewie [name] [name] 03/09/2022 10:30 A M Medical Record Number: [id number] Account Number: [id number] of Birth/Sex: Treating RN: [birth year](36y.o. M) Primary Care Provider: PSeward CarolOther Clinician: Referring Provider: Treating Provider/Extender: COsborn Cohoin Treatment:  123 Information Obtained From Patient Eyes Medical History: Negative for: Cataracts; Glaucoma; Optic Neuritis Ear/Nose/Mouth/Throat Medical History: Negative for: Chronic sinus problems/congestion; Middle ear problems Hematologic/Lymphatic Medical History: Negative for: Anemia; Hemophilia; Human Immunodeficiency Virus; Lymphedema; Sickle Cell Disease Respiratory Medical History: Negative for: Aspiration; Asthma; Chronic Obstructive Pulmonary Disease (COPD);  Pneumothorax; Sleep Apnea; Tuberculosis Cardiovascular Medical History: Negative for: Angina; Arrhythmia; Congestive Heart Failure; Coronary Artery Disease; Deep Vein Thrombosis; Hypertension; Hypotension; Myocardial Infarction; Peripheral Arterial Disease; Peripheral Venous Disease; Phlebitis; Vasculitis Gastrointestinal Medical History: Negative for: Cirrhosis ; Colitis; Crohns; Hepatitis A; Hepatitis B; Hepatitis C Endocrine Medical History: Positive for: Type II Diabetes Negative for: Type I Diabetes Time with diabetes: 8 Treated with: Insulin Blood sugar tested every day: No Immunological Medical History: Negative for: Lupus Erythematosus; Raynauds; Scleroderma Integumentary (Skin) Medical History: Negative for: History of Burn Musculoskeletal Medical History: Blunck, [name] ([id number]) [id number] Page 20 of 21 Negative for: Gout; Rheumatoid Arthritis; Osteoarthritis; Osteomyelitis Neurologic Medical History: Negative for: Dementia; Neuropathy; Quadriplegia; Paraplegia; Seizure Disorder Oncologic Medical History: Negative for: Received Chemotherapy; Received Radiation Psychiatric Medical History: Negative for: Anorexia/bulimia; Confinement Anxiety Immunizations Pneumococcal Vaccine: Received Pneumococcal Vaccination: No Implantable Devices None Hospitalization / Surgery History Type of Hospitalization/Surgery 11/1-11/06/2018- sepsis foot infection 11/4-11/5 02 sats low respiratory distress Family and Social History Cancer: No; Diabetes: No; Hereditary Spherocytosis: No; Hypertension: No; Kidney Disease: No; Lung Disease: No; Seizures: No; Stroke: No; Thyroid Problems: No; Tuberculosis: No; Never smoker; Marital Status - Single; Alcohol Use: Rarely; Drug Use: No History; Caffeine Use: Never; Financial Concerns: No; Food, Clothing or Shelter Needs: No; Support System Lacking: No; Transportation Concerns: No Electronic  Signature(s) Signed: 03/09/2022 5:07:18 PM By: Max Maudlin MD FACS Entered By: [name] on 03/09/2022 12:15:05 -------------------------------------------------------------------------------- SuperBill Details Patient Name: Date of Service: [name], [name] 03/09/2022 Medical Record Number: [id number] Patient Account Number: [id number] Date of Birth/Sex: Treating RN: July 07, 1985 (36 y.o. M) Primary Care Provider: Seward [name] Other Clinician: Referring Provider: Treating Provider/Extender: [name] in Treatment: 123 Diagnosis Coding ICD-10 Codes Code Description E11.621 Type 2 diabetes mellitus with foot ulcer L97.528 Non-pressure chronic ulcer of other part of left foot with other specified severity Facility Procedures : 7 CPT4 Code: [id number] Description: 32919 - DEBRIDE WOUND 1ST 20 SQ CM OR < ICD-10 Diagnosis Description L97.528 Non-pressure chronic ulcer of other part of left foot with other specified severi Modifier: ty Quantity: 1 Physician Procedures : CPT4 Code Description Modifier [id number] - WC PHYS LEVEL 4 - EST PT 25 ICD-10 Diagnosis Description E11.621 Type 2 diabetes mellitus with foot ulcer L97.528 Non-pressure chronic ulcer of other part of left foot with other specified severity Quantity: 1 : [id number] - WC PHYS DEBR WO ANESTH 20 SQ CM ICD-10 Diagnosis Description L97.528 Non-pressure chronic ulcer of other part of left foot with other specified severity Quantity: 1 Electronic Signature(s) Signed: 03/09/2022 12:17:16 PM By: Max Maudlin MD FACS Entered By: [name] on 03/09/2022 12:17:15

## 2022-03-10 NOTE — Progress Notes (Signed)
[name], Max ([id number]) [id number] Page 1 of 8 Visit Report for 03/09/2022 Arrival Information Details Patient Name: Date of Service: [name] D 03/09/2022 10:30 A M Medical Record Number: [id number] Patient Account Number: [id number] Date of Birth/Sex: Treating RN: 03-25-1986 (36 y.o. [name] Primary Care Alaric Gladwin: [name] Other Clinician: Referring Cathaleen Korol: Treating Siaosi Alter/Extender: [name] in Treatment: 35 Visit Information History Since Last Visit Added or deleted any medications: No Patient Arrived: Ambulatory Any new allergies or adverse reactions: No Arrival Time: 10:37 Had a fall or experienced change in No Accompanied By: self activities of daily living that may affect Transfer Assistance: None risk of falls: Patient Identification Verified: Yes Signs or symptoms of abuse/neglect since last visito No Secondary Verification Process Completed: Yes Hospitalized since last visit: No Patient Requires Transmission-Based Precautions: No Implantable device outside of the clinic excluding No Patient Has Alerts: No cellular tissue based products placed in the center since last visit: Has Dressing in Place as Prescribed: Yes Pain Present Now: No Electronic Signature(s) Signed: 03/09/2022 4:06:00 PM By: [name] RN, BSN Entered By: [name] on 03/09/2022 10:38:03 -------------------------------------------------------------------------------- Encounter Discharge Information Details Patient Name: Date of Service: [name], [name] 03/09/2022 10:30 A M Medical Record Number: [id number] Patient Account Number: [id number] Date of Birth/Sex: Treating RN: 07/25/1985 (36 y.o. [name] Primary Care [name]: [name] Other Clinician: Referring Chelsee Hosie: Treating Kayo Zion/Extender: [name] in Treatment: 123 Encounter Discharge Information  Items Post Procedure Vitals Discharge Condition: Stable Temperature (F): 98.6 Ambulatory Status: Ambulatory Pulse (bpm): 84 Discharge Destination: Home Respiratory Rate (breaths/min): 18 Transportation: Private Auto Blood Pressure (mmHg): 145/92 Accompanied By: self Schedule Follow-up Appointment: Yes Clinical Summary of Care: Patient Declined Electronic Signature(s) Signed: 03/09/2022 4:06:00 PM By: [name] RN, BSN Entered By: [name] on 03/09/2022 11:35:40 [place], Max ([id number]) [id number] Page 2 of 8 -------------------------------------------------------------------------------- Lower Extremity Assessment Details Patient Name: Date of Service: [name] D 03/09/2022 10:30 A M Medical Record Number: [id number] Patient Account Number: [id number] Date of Birth/Sex: Treating RN: 04-12-1985 (36 y.o. [name] Primary Care Jayse Hodkinson: [name] Other Clinician: Referring Shaletta Hinostroza: Treating Cashton Hosley/Extender: [name] in Treatment: 123 Edema Assessment Assessed: [name]: No] [name]: No] Edema: [Left: Ye] [Right: s] Calf Left: Right: Point of Measurement: 48 cm From Medial Instep 48 cm Ankle Left: Right: Point of Measurement: 11 cm From Medial Instep 30.3 cm Vascular Assessment Pulses: Dorsalis Pedis Palpable: [Left:Yes] Electronic Signature(s) Signed: 03/09/2022 4:06:00 PM By: [name] RN, BSN Entered By: [name] on 03/09/2022 10:40:53 -------------------------------------------------------------------------------- Multi Wound Chart Details Patient Name: Date of Service: [name], [name] 03/09/2022 10:30 A M Medical Record Number: [id number] Patient Account Number: [id number] Date of Birth/Sex: Treating RN: 06/01/1985 (36 y.o. M) Primary Care [name]: [name] Other Clinician: Referring Mishaal Lansdale: Treating Winry Egnew/Extender: [name]  in Treatment: 123 Vital Signs Height(in): 77 Capillary Blood Glucose(mg/dl): 79 Weight(lbs): 280 Pulse(bpm): 63 Body Mass Index(BMI): 33.2 Blood Pressure(mmHg): 145/92 Temperature(F): 98.6 Respiratory Rate(breaths/min): 18 [3:Photos:] [N/A:N/A] Left, Lateral, Plantar Foot N/A N/A Wound Location: Trauma N/A N/A Wounding Event: Diabetic Wound/Ulcer of the Lower N/A N/A Primary Etiology: Extremity Type II Diabetes N/A N/A Comorbid History: 10/02/2019 N/A N/A Date Acquired: 123 N/A N/A Weeks of Treatment: Open N/A N/A Wound Status: No N/A N/A Wound Recurrence: 3.5x3x1.5 N/A N/A Measurements L x W x D (cm) 8.247 N/A N/A A (cm) : rea 12.37 N/A N/A Volume (  cm) : -400.10% N/A N/A % Reduction in A rea: -7397.00% N/A N/A % Reduction in Volume: Grade 2 N/A N/A Classification: Medium N/A N/A Exudate A mount: Serosanguineous N/A N/A Exudate Type: red, brown N/A N/A Exudate Color: Distinct, outline attached N/A N/A Wound Margin: Large (67-100%) N/A N/A Granulation A mount: Red N/A N/A Granulation Quality: Small (1-33%) N/A N/A Necrotic A mount: Fat Layer (Subcutaneous Tissue): Yes N/A N/A Exposed Structures: Fascia: No Tendon: No Muscle: No Joint: No Bone: No Small (1-33%) N/A N/A Epithelialization: Debridement - Selective/Open Wound N/A N/A Debridement: Pre-procedure Verification/Time Out 10:45 N/A N/A Taken: Callus N/A N/A Tissue Debrided: Skin/Epidermis N/A N/A Level: 10.5 N/A N/A Debridement A (sq cm): rea Curette N/A N/A Instrument: Minimum N/A N/A Bleeding: Pressure N/A N/A Hemostasis A chieved: 0 N/A N/A Procedural Pain: 0 N/A N/A Post Procedural Pain: Procedure was tolerated well N/A N/A Debridement Treatment Response: 3.5x3x1.5 N/A N/A Post Debridement Measurements L x W x D (cm) 12.37 N/A N/A Post Debridement Volume: (cm) Callus: Yes N/A N/A Periwound Skin Texture: Maceration: Yes N/A N/A Periwound Skin Moisture: No  Abnormalities Noted N/A N/A Periwound Skin Color: No Abnormality N/A N/A Temperature: Debridement N/A N/A Procedures Performed: Negative Pressure Wound Therapy Maintenance (NPWT) Treatment Notes Wound #3 (Foot) Wound Laterality: Plantar, Left, Lateral Cleanser Soap and Water Discharge Instruction: May shower and wash wound with dial antibacterial soap and water prior to dressing change. Peri-Wound Care Skin Prep Discharge Instruction: Use skin prep as directed Topical Primary Dressing Promogran Prisma Matrix, 4.34 (sq in) (silver collagen) Discharge Instruction: Moisten collagen with saline or hydrogel VAC Secondary Dressing Secured With Compression Wrap Kerlix Roll 4.5x3.1 (in/yd) Discharge Instruction: Apply Kerlix and Coban compression as directed. Compression Stockings Add-Ons Sadek, Max ([id number]) [id number] Page 4 of 8 Electronic Signature(s) Signed: 03/09/2022 12:13:55 PM By: [name] MD FACS Entered By: [name] on 03/09/2022 12:13:55 -------------------------------------------------------------------------------- Multi-Disciplinary Care Plan Details Patient Name: Date of Service: [name], [name] 03/09/2022 10:30 A M Medical Record Number: [id number] Patient Account Number: [id number] Date of Birth/Sex: Treating RN: 1986/03/21 (36 y.o. [name] Primary Care Gay Moncivais: [name] Other Clinician: Referring Nour Scalise: Treating Tanyia Grabbe/Extender: [name] in Treatment: [place] reviewed with physician Active Inactive Nutrition Nursing Diagnoses: Imbalanced nutrition Potential for alteratiion in Nutrition/Potential for imbalanced nutrition Goals: Patient/caregiver agrees to and verbalizes understanding of need to use nutritional supplements and/or vitamins as prescribed Date Initiated: 10/24/2019 Date Inactivated: 04/06/2020 Target Resolution Date:  04/03/2020 Goal Status: Met Patient/caregiver will maintain therapeutic glucose control Date Initiated: 10/24/2019 Target Resolution Date: 04/20/2022 Goal Status: Active Interventions: Assess HgA1c results as ordered upon admission and as needed Assess patient nutrition upon admission and as needed per policy Provide education on elevated blood sugars and impact on wound healing Provide education on nutrition Treatment Activities: Education provided on Nutrition : 02/02/2022 Notes: 11/17/20: Glucose control ongoing issue, target date extended. 01/26/21: Glucose management continues. Wound/Skin Impairment Nursing Diagnoses: Impaired tissue integrity Knowledge deficit related to ulceration/compromised skin integrity Goals: Patient/caregiver will verbalize understanding of skin care regimen Date Initiated: 10/24/2019 Target Resolution Date: 04/20/2022 Goal Status: Active Ulcer/skin breakdown will have a volume reduction of 30% by week 4 Date Initiated: 10/24/2019 Date Inactivated: 01/16/2020 Target Resolution Date: 01/10/2020 Unmet Reason: no change in Goal Status: Unmet measurements. Interventions: Assess patient/caregiver ability to obtain necessary supplies Assess patient/caregiver ability to perform ulcer/skin care regimen upon admission and as needed Assess ulceration(s) every visit Provide education on ulcer and skin care  Notes: Lavell, Max ([id number]) [id number] Page 5 of 8 11/17/20: Wound care regimen continues Electronic Signature(s) Signed: 03/09/2022 4:06:00 PM By: [name] RN, BSN Entered By: [name] on 03/09/2022 11:34:04 -------------------------------------------------------------------------------- Negative Pressure Wound Therapy Maintenance (NPWT) Details Patient Name: Date of Service: [name] D 03/09/2022 10:30 A M Medical Record Number: [id number] Patient Account Number: [id number] Date of Birth/Sex: Treating  RN: 08-31-1985 (36 y.o. [name] Primary Care Liliani Bobo: [name] Other Clinician: Referring Tejuan Gholson: Treating Rilynn Habel/Extender: [name] in Treatment: 123 NPWT Maintenance Performed for: Wound #3 Left, Lateral, Plantar Foot Performed By: [name], RN Type: VAC System Coverage Size (sq cm): 10.5 Pressure Type: Constant Pressure Setting: 125 mmHG Drain Type: None Primary Contact: Other : Sponge/Dressing Type: Foam, Black Date Initiated: 02/11/2022 Dressing Removed: No Quantity of Sponges/Gauze Removed: 1 black foam Canister Changed: No Canister Exudate Volume: 100 Dressing Reapplied: No Quantity of Sponges/Gauze Inserted: 1 black foam Respones T Treatment: o pt tolerated well Days On NPWT : 27 Post Procedure Diagnosis Same as Pre-procedure Electronic Signature(s) Signed: 03/09/2022 4:06:00 PM By: [name] RN, BSN Entered By: [name] on 03/09/2022 10:51:49 -------------------------------------------------------------------------------- Pain Assessment Details Patient Name: Date of Service: [name], [name] 03/09/2022 10:30 A M Medical Record Number: [id number] Patient Account Number: [id number] Date of Birth/Sex: Treating RN: July 03, 1985 (36 y.o. [name] Primary Care Sharmeka Palmisano: [name] Other Clinician: Referring Felisha Claytor: Treating Kamera Dubas/Extender: [name] in Treatment: 123 Active Problems Location of Pain Severity and Description of Pain Patient Has Paino No Site Locations [place], Max ([id number]) [id number] Page 6 of 8 Pain Management and Medication Current Pain Management: Electronic Signature(s) Signed: 03/09/2022 4:06:00 PM By: [name] RN, BSN Entered By: [name] on 03/09/2022 10:38:42 -------------------------------------------------------------------------------- Patient/Caregiver Education Details Patient  Name: Date of Service: [name], [name] 12/6/2023andnbsp10:30 A M Medical Record Number: [id number] Patient Account Number: [id number] Date of Birth/Gender: Treating RN: May 08, 1985 (36 y.o. [name] Primary Care Physician: [name] Other Clinician: Referring Physician: Treating Physician/Extender: [name] in Treatment: 21 Education Assessment Education Provided To: Caregiver Education Topics Provided Wound/Skin Impairment: Methods: Explain/Verbal Responses: State content correctly [employer]) Signed: 03/09/2022 4:06:00 PM By: [name] RN, BSN Entered By: [name] on 03/09/2022 11:34:21 -------------------------------------------------------------------------------- Wound Assessment Details Patient Name: Date of Service: [name], [name] 03/09/2022 10:30 A M Medical Record Number: [id number] Patient Account Number: [id number] Date of Birth/Sex: Treating RN: May 09, 1985 (36 y.o. [name] Primary Care Makinsley Schiavi: [name] Other Clinician: Referring Graison Leinberger: Treating Pratik Dalziel/Extender: Wilfrid Lund Pierron, Max ([id number]) [id number] Page 7 of 8 Weeks in Treatment: 123 Wound Status Wound Number: 3 Primary Etiology: Diabetic Wound/Ulcer of the Lower Extremity Wound Location: Left, Lateral, Plantar Foot Wound Status: Open Wounding Event: Trauma Comorbid History: Type II Diabetes Date Acquired: 10/02/2019 Weeks Of Treatment: 123 Clustered Wound: No Photos Wound Measurements Length: (cm) 3.5 Width: (cm) 3 Depth: (cm) 1.5 Area: (cm) 8.247 Volume: (cm) 12.37 % Reduction in Area: -400.1% % Reduction in Volume: -7397% Epithelialization: Small (1-33%) Tunneling: No Undermining: No Wound Description Classification: Grade 2 Wound Margin: Distinct, outline attached Exudate Amount: Medium Exudate Type: Serosanguineous Exudate Color: red,  brown Foul Odor After Cleansing: No Slough/Fibrino No Wound Bed Granulation Amount: Large (67-100%) Exposed Structure Granulation Quality: Red Fascia Exposed: No Necrotic Amount: Small (1-33%) Fat Layer (Subcutaneous Tissue) Exposed: Yes Necrotic Quality: Adherent Slough Tendon Exposed: No Muscle Exposed: No Joint Exposed: No  Bone Exposed: No Periwound Skin Texture Texture Color No Abnormalities Noted: No No Abnormalities Noted: Yes Callus: Yes Temperature / Pain Temperature: No Abnormality Moisture No Abnormalities Noted: No Maceration: Yes Treatment Notes Wound #3 (Foot) Wound Laterality: Plantar, Left, Lateral Cleanser Soap and Water Discharge Instruction: May shower and wash wound with dial antibacterial soap and water prior to dressing change. Peri-Wound Care Skin Prep Discharge Instruction: Use skin prep as directed Topical Primary Dressing Promogran Prisma Matrix, 4.34 (sq in) (silver collagen) Discharge Instruction: Moisten collagen with saline or hydrogel VAC Bartoli, Max ([id number]) [id number] Page 8 of 8 Secondary Dressing Secured With Compression Wrap Kerlix Roll 4.5x3.1 (in/yd) Discharge Instruction: Apply Kerlix and Coban compression as directed. Compression Stockings Add-Ons Electronic Signature(s) Signed: 03/09/2022 4:06:00 PM By: [name] RN, BSN Entered By: [name] on 03/09/2022 10:43:59 -------------------------------------------------------------------------------- Vitals Details Patient Name: Date of Service: [name], [name] 03/09/2022 10:30 A M Medical Record Number: [id number] Patient Account Number: [id number] Date of Birth/Sex: Treating RN: [birth year] (36 y.o. [name] Primary Care Coty Larsh: [name] Other Clinician: Referring Sieanna Vanstone: Treating Jodee Wagenaar/Extender: [name] in Treatment: 123 Vital Signs Time Taken: 10:38 Temperature (F):  98.6 Height (in): 77 Pulse (bpm): 84 Weight (lbs): 280 Respiratory Rate (breaths/min): 18 Body Mass Index (BMI): 33.2 Blood Pressure (mmHg): 145/92 Capillary Blood Glucose (mg/dl): 79 Reference Range: 80 - 120 mg / dl Electronic Signature(s) Signed: 03/09/2022 4:06:00 PM By: [name] RN, BSN Entered By: [name] on 03/09/2022 10:38:33

## 2022-03-16 ENCOUNTER — Encounter (HOSPITAL_BASED_OUTPATIENT_CLINIC_OR_DEPARTMENT_OTHER): Payer: BC Managed Care – PPO | Admitting: Internal Medicine

## 2022-03-16 DIAGNOSIS — E11621 Type 2 diabetes mellitus with foot ulcer: Secondary | ICD-10-CM | POA: Diagnosis not present

## 2022-03-17 NOTE — Progress Notes (Signed)
[name], [name] ([id number]) [id number] Page 1 of 17 Visit Report for 03/16/2022 HPI Details Patient Name: Date of Service: [name] D 03/16/2022 8:15 A M Medical Record Number: [id number] Patient Account Number: [id number] Date of Birth/Sex: Treating RN: May 23, 1985 (36 y.o. 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 He comes in for review of a wound over the plantar fifth metatarsal head extending into the lateral part of the foot. He was followed for this previously by his podiatrist Dr. Cornelius Moras. As the patient tells his story he went to see podiatry first for a swelling he developed on the lateral part of his fifth metatarsal head in May. He states this was "open" by podiatry and the area closed. He was followed up in June and it was again opened callus removed and it closed promptly. There were plans being made for surgery on the fifth metatarsal head in June however his blood sugar was apparently too high for anesthesia. Apparently the area was debrided and opened again in June and it is never closed since. Looking over the records from podiatry I am really not able to follow this. It was clear when he was first seen it was before 5/14 at that point he already had a wound. By 5/17 the ulcer was resolved. I do not see anything about a procedure. On 5/28 noted to have pre-ulcerative moderate keratosis. X-ray noted 1/5 contracted toe and tailor's bunion and metatarsal deformity. On a visit date on 09/28/2018 the dorsal part of the left foot it healed and resolved. There was concern about swelling in his lower extremity he was sent to the ER.. As far as I can tell he was seen in the ER on 7/12 with  an ulcer on his left foot. A DVT rule out of the left leg was negative. I do not think I have complete records from podiatry but I am not able to verify the procedures this patient states he had. He states after the last procedure the wound has never closed although I am not able to follow this in the records I have from podiatry. He has not had a recent x-ray The patient has been using Neosporin on the wound. He is wearing a Darco shoe. He is still very active up on his foot working and exercising. Past medical history; type 2 diabetes ketosis-prone, leg swelling with a negative DVT study in July. Non-smoker ABI in our clinic was 0.85 on the left 10/16; substantial wound on the plantar left fifth met head extending laterally almost to the dorsal fifth MTP. We have been using silver alginate we gave him a Darco forefoot off loader. An x-ray did not show evidence of osteomyelitis did note soft tissue emphysema which I think was due to gas tracking through an open wound. There is no doubt in my mind he requires an MRI 10/23; MRI not booked until 3 November at the earliest this is largely due to his glucose sensor in the right arm. We have been using silver alginate. There has been an improvement 10/29; I am still not exactly sure when his MRI is booked for. He says it is the third but it is the 10th in epic. This definitely needs to be done. He is running a low-grade fever today but no other  symptoms. No real improvement in the 1 02/26/2019 patient presents today for a follow-up visit here in our clinic he is last been seen in the clinic on October 29. Subsequently we were working on getting MRI to evaluate and see what exactly was going on and where we would need to go from the standpoint of whether or not he had osteomyelitis and again what treatments were going be required. Subsequently the patient ended up being admitted to the hospital on 02/07/2019 and was discharged on 02/14/2019. This is a  somewhat interesting admission with a discharge diagnosis of pneumonia due to COVID-19 although he was positive for COVID-19 when tested at the urgent care but negative x2 when he was actually in the hospital. With that being said he did have acute respiratory failure with hypoxia and it was noted he also have a left foot ulceration with osteomyelitis. With that being said he did require oxygen for his pneumonia and I level 4 L. He was placed on antivirals and steroids for the COVID-19. He was also transferred to the [facility] at one point. Nonetheless he did subsequently discharged home and since being home has done much better in that regard. The CT angiogram did not show any pulmonary embolism. With regard to the osteomyelitis the patient was placed on vancomycin and Zosyn while in the hospital but has been changed to Augmentin at discharge. It was also recommended that he follow- up with wound care and podiatry. Podiatry however wanted him to see [name] according to the patient prior to them doing anything further. His hemoglobin A1c was 9.9 as noted in the hospital. Have an MRI of the left foot performed while in the hospital on 02/04/2019. 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 There is an overlying plantar open wound noted an abscess tracking back along the lateral aspect of the fifth metatarsal shaft. There is otherwise diffuse cellulitis and mild fasciitis without findings of polymyositis. The patient did have recently pneumonia secondary to COVID-19 I looked in the chart through epic and it does appear that the patient may need to have an additional x-ray just to ensure everything is cleared and that he has no airspace disease prior to putting him into the [name]. 03/05/2019; patient was readmitted to the clinic last week. He was hospitalized twice for a viral upper respiratory tract  infection from 11/1 through 11/4 and then 11/5 through 11/12 ultimately this turned out to be Covid pneumonitis. Although he was discharged on oxygen he is not using it. He says he feels fine. He has no exercise limitation no cough no sputum. His O2 sat in our clinic today was 100% on room air. 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 He received Vanco and Zosyn in the hospital and then was discharged on 2 weeks of Augmentin. I do not see any relevant cultures. He was supposed to follow-up with infectious disease but I do not see that he has an appointment. 12/8; patient saw Dr. Novella Olive of infectious disease last week. He felt that he had had adequate antibiotic therapy. He did not go to follow-up with Dr. Amalia Hailey of podiatry and I have again talked to him about the pros and cons of this. He does not want to consider a ray amputation of this time. He is aware  of the risks of recurrence, migration etc. He started HBO today and tolerated this well. He can complete the Augmentin that I gave him last week. I have looked over the lab work that Dr. Chana Bode ordered his C-reactive protein was 3.3 and his sedimentation rate was 17. The C-reactive protein is never really been measurably that high in this patient 12/15; not much change in the wound today however he has undermining along the lateral part of the foot again more extensively than last week. He has some rims of epithelialization. We have been using silver alginate. He is undergoing hyperbarics but did not dive today 12/18; in for his obligatory first total contact cast change. Unfortunately there was pus coming from the undermining area around his fifth metatarsal head. This was cultured but will preclude reapplication of a cast. He is seen in conjunction with HBO 12/24; patient had staph lugdunensis in the wound in the undermining area  laterally last time. We put him on doxycycline which should have covered this. The wound looks better today. I am going to give him another week of doxycycline before reattempting the total contact cast 12/31; the patient is completing antibiotics. Hemorrhagic debris in the distal part of the wound with some undermining distally. He also had hyper granulation. Flammia, [name] ([id number]) [id number] Page 2 of 17 Extensive debridement with a #5 curette. The infected area that was on the lateral part of the fifth met head is closed over. I do not think he needs any more antibiotics. Patient was seen prior to HBO. Preparations for a total contact cast were made in the cast will be placed post hyperbarics 04/11/19; once again the patient arrives today without complaint. He had been in a cast all week noted that he had heavy drainage this week. This resulted in large raised areas of macerated tissue around the wound 1/14; wound bed looks better slightly smaller. Hydrofera Blue has been changing himself. He had a heavy drainage last week which caused a lot of maceration around the wound so I took him out of a total contact cast he says the drainage is actually better this week He is seen today in conjunction with HBO 1/21; returns to clinic. He was up in [place] for a day or 2 attending a funeral. He comes back in with the wound larger and with a large area of exposed bone. He had osteomyelitis and septic arthritis of the fifth left metatarsal head while he was in hospital. He received IV antibiotics in the hospital for a prolonged period of time then 3 weeks of Augmentin. Subsequently I gave him 2 weeks of doxycycline for more superficial wound infection. When I saw this last week the wound was smaller the surface of the wound looks satisfactory. 1/28; patient missed hyperbarics today. Bone biopsy I did last time showed Enterococcus faecalis and Staphylococcus lugdunensis . He  has a wide area of exposed bone. We are going to use silver alginate as of today. I had another ethical discussion with the patient. 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 In this situation I think the likelihood of healing this is low. Therefore I have recommended a ray amputation and with the patient's agreement I have referred him to Dr. Doran Durand. The other issue is that his compliance with hyperbarics has been minimal because of his work schedule and given his underlying decision I am going to stop this today READMISSION 10/24/2019 MRI 09/29/2019 left foot IMPRESSION: 1. Apparent skin ulceration inferior  and lateral to the 5th metatarsal base with underlying heterogeneous T2 signal and enhancement in the subcutaneous fat. Small peripherally enhancing fluid collections along the plantar and lateral aspects of the 5th metatarsal base suspicious for abscesses. 2. Interval amputation through the mid 5th metatarsal with nonspecific low-level marrow edema and enhancement. Given the proximity to the adjacent soft tissue inflammatory changes, osteomyelitis cannot be excluded. 3. The additional bones appear unremarkable. MRI 09/29/2019 right foot IMPRESSION: 1. Soft tissue ulceration lateral to the 5th MTP joint. There is low-level T2 hyperintensity within the 4th and 5th metatarsal heads and adjacent proximal phalanges without abnormal T1 signal or cortical destruction. These findings are nonspecific and could be seen with early marrow edema, hyperemia or early osteomyelitis. No evidence of septic joint. 2. Mild tenosynovitis and synovial enhancement associated with the extensor digitorum tendons at the level of the midfoot. 3. Diffuse low-level muscular T2 hyperintensity and enhancement, most consistent with diabetic myopathy. LEFT FOOT BONE Methicillin resistant staphylococcus aureus Staphylococcus lugdunensis MIC MIC CIPROFLOXACIN >=8 RESISTANT  Resistant <=0.5 SENSI... Sensitive CLINDAMYCIN <=0.25 SENS... Sensitive >=8 RESISTANT Resistant ERYTHROMYCIN >=8 RESISTANT Resistant >=8 RESISTANT Resistant GENTAMICIN <=0.5 SENSI... Sensitive <=0.5 SENSI... Sensitive Inducible Clindamycin NEGATIVE Sensitive NEGATIVE Sensitive OXACILLIN >=4 RESISTANT Resistant 2 SENSITIVE Sensitive RIFAMPIN <=0.5 SENSI... Sensitive <=0.5 SENSI... Sensitive TETRACYCLINE <=1 SENSITIVE Sensitive <=1 SENSITIVE Sensitive TRIMETH/SULFA <=10 SENSIT Sensitive <=10 SENSIT Sensitive ... Marland Kitchen.. VANCOMYCIN 1 SENSITIVE Sensitive <=0.5 SENSI... Sensitive Right foot bone . Component 3 wk ago Specimen Description BONE Special Requests RIGHT 4 METATARSAL SAMPLE B Gram Stain NO WBC SEEN NO ORGANISMS SEEN Culture RARE METHICILLIN RESISTANT STAPHYLOCOCCUS AUREUS NO ANAEROBES ISOLATED Performed at [facility], [place] [address], [place], [place] [address] Report Status 10/08/2019 FINAL Organism ID, Bacteria METHICILLIN RESISTANT STAPHYLOCOCCUS AUREUS Resulting Agency [facility] Susceptibility Methicillin resistant staphylococcus aureus MIC CIPROFLOXACIN >=8 RESISTANT Resistant CLINDAMYCIN <=0.25 SENS... Sensitive ERYTHROMYCIN >=8 RESISTANT Resistant GENTAMICIN <=0.5 SENSI... Sensitive Inducible Clindamycin NEGATIVE Sensitive OXACILLIN >=4 RESISTANT Resistant RIFAMPIN <=0.5 SENSI... Sensitive Goltz, [name] ([id number]) [id number] Page 3 of 17 TETRACYCLINE <=1 SENSITIVE Sensitive TRIMETH/SULFA <=10 SENSIT Sensitive ... VANCOMYCIN 1 SENSITIVE Sensitive This is a patient we had in clinic earlier this year with a wound over his left fifth metatarsal head. 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 Doran Durand on 05/16/2019. He seems to have developed problems with wounds on his bilateral feet in June 2021 although he may have had problems earlier than this. He was in an urgent care with a right foot ulcer on 09/26/2019 and given a course of doxycycline. This was apparently after having trouble getting into see orthopedics. He was seen by podiatry on 09/28/2019 noted to have bilateral lower extremity ulcers including the left lateral fifth metatarsal base and the right subfifth met head. It was noted that had purulent drainage at that time. He required hospitalization from 6/20 through 7/2. This was because of worsening right foot wounds. He underwent bilateral operative incision and drainage and bone biopsies bilaterally. Culture results are listed above. He has been referred back to clinic by Dr. Jacqualyn Posey of podiatry. He is also followed by Dr. Megan Salon who saw him yesterday. He was discharged from hospital on Zyvox Flagyl and Levaquin and yesterday changed to doxycycline Flagyl and  Levaquin. His inflammatory markers on 6/26 showed a sedimentation rate of 129 and a C-reactive protein of 5. This is improved to 14 and 1.3 respectively. This would indicate improvement. ABIs in our clinic today were 1.23 on the right and 1.20 on the left 11/01/2019 on evaluation today patient appears to be doing fairly well in regard to the wounds on his feet at this point. Fortunately there is no signs of active infection at this time. No fevers, chills, nausea, vomiting, or diarrhea. He currently is seeing infectious disease and still under their care at this point. Subsequently he also has both wounds which she has not been using collagen on as he did not receive that in his packaging he did not call us and let us know that. Apparently that just was missed on the order. Nonetheless we will get that straightened out today. 8/9-Patient returns for bilateral foot wounds, using  Prisma with hydrogel moistened dressings, and the wounds appear stable. Patient using surgical shoes, avoiding much pressure or weightbearing as much as possible 8/16; patient has bilateral foot wounds. 1 on the right lateral foot proximally the other is on the left mid lateral foot. Both required debridement of callus and thick skin around the wounds. We have been using silver collagen 8/27; patient has bilateral lateral foot wounds. The area on the left substantially surrounded by callus and dry skin. This was removed from the wound edge. The underlying wound is small. The area on the right measured somewhat smaller today. 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 Unfortunately I did not update his antibiotics during today's visit. 9/10 I reviewed Dr. Hale Bogus last notes he felt he had completed antibiotics his inflammatory markers were reasonably well controlled. He has a small wound on the lateral left foot and a tiny area on the right which is just above closed. He is using Hydrofera Blue with border foam he has bilateral surgical shoes 9/24; 2 week f/u. doing well. right foot is closed. left foot still undermined. 10/14; right foot remains closed at the fifth met head. The area over the base of the left fifth metatarsal has a small open area but considerable undermining towards the plantar foot. Thick callus skin around this suggests an adequate pressure relief. We have talked about this. He says he is going to go back into his cam boot. I suggested a total contact cast he did not seem enamored with this suggestion 10/26; left foot base of the fifth metatarsal. Same condition as last time. He has skin over the area with an open wound however the skin is not adherent. He went to see Dr. Earleen Newport who did an x-ray and culture of his foot I have not reviewed the x-ray but the patient was not told anything. He is on  doxycycline 11/11; since the patient was last here he was in the emergency room on 10/30 he was concerned about swelling in the left foot. They did not do any cultures or x-rays. They changed his antibiotics to cephalexin. Previous culture showed group B strep. The cephalexin is appropriate as doxycycline has less than predictable coverage. Arrives in clinic today with swelling over this area under the wound. He also has a new wound on the right fifth metatarsal head 11/18; the patient has a difficult wound on the lateral aspect of the left fifth metatarsal head. The wound was almost ballotable last week I opened it slightly expecting to see purulence  however there was just bleeding. I cultured this this was negative. X-ray unchanged. We are trying to get an MRI but I am not sure were going to be able to get this through his insurance. He also has an area on the right lateral fifth metatarsal head this looks healthier 12/3; the patient finally got our MRI. Surprisingly this did not show osteomyelitis. I did show the soft tissue ulceration at the lateral plantar aspect of the fifth metatarsal base with a tiny residual 6 mm abscess overlying the superficial fascia I have tried to culture this area I have not been able to get this to grow anything. Nevertheless the protruding tissue looks aggravated. I suspect we should try to treat the underlying "abscess with broad-spectrum antibiotics. I am going to start him on Levaquin and Flagyl. He has much less edema in his legs and I am going to continue to wrap his legs and see him weekly 12/10. I started Levaquin and Flagyl on him last week. He just picked up the Flagyl apparently there was some delay. The worry is the wound on the left fifth metatarsal base which is substantial and worsening. His foot looks like he inverts at the ankle making this a weightbearing surface. Certainly no improvement in fact I think the measurements of this are somewhat worse. We  have been using 12/17; he apparently just got the Levaquin yesterday this is 2 weeks after the fact. He has completed the Flagyl. The area over the left fifth metatarsal base still has protruding granulation tissue although it does not look quite as bad as it did some weeks ago. He has severe bilateral lymphedema although we have not been treating him for wounds on his legs this is definitely going to require compression. There was so much edema in the left I did not wish to put him in a total contact cast today. I am going to increase his compression from 3-4 layer. The area on the right lateral fifth met head actually look quite good and superficial. 12/23; patient arrived with callus on the right fifth met head and the substantial hyper granulated callused wound on the base of his fifth metatarsal. He says he is completing his Levaquin in 2 days but I do not think that adds up with what I gave him but I will have to double check this. We are using Hydrofera Blue on both areas. My plan is to put the left leg in a cast the week after New Year's 04/06/2020; patient's wounds about the same. Right lateral fifth metatarsal head and left lateral foot over the base of the fifth metatarsal. There is undermining on the left lateral foot which I removed before application of total contact cast continuing with Hydrofera Blue new. Patient tells me he was seen by endocrinology today lab work was done [Dr. Kerr]. Also wondering whether he was referred to cardiology. I went over some lab work from previously does not have chronic renal failure certainly not nephrotic range proteinuria he does have very poorly controlled diabetes but this is not his most updated lab work. Hemoglobin A1c has been over 11 1/10; the patient had a considerable amount of leakage towards mid part of his left foot with macerated skin however the wound surface looks better the area on the right lateral fifth met head is better as well. I am  going to change the dressing on the left foot under the total contact cast to silver alginate, continue with Hydrofera Blue on the  right. 1/20; patient was in the total contact cast for 10 days. Considerable amount of drainage although the skin around the wound does not look too bad on the left foot. The area on the right fifth metatarsal head is closed. Our nursing staff reports large amount of drainage out of the left lateral foot wound 1/25; continues with copious amounts of drainage described by our intake staff. PCR culture I did last week showed E. coli and Enterococcus faecalis and low quantities. Multiple resistance genes documented including extended spectrum beta lactamase, MRSA, MRSE, quinolone, tetracycline. The wound is not quite as good this week as it was 5 days ago but about the same size 2/3; continues with copious amounts of malodorous drainage per our intake nurse. The PCR culture I did 2 weeks ago showed E. coli and low quantities of Enterococcus. There were multiple resistance genes detected. I put Neosporin on him last week although this does not seem to have helped. The wound is slightly deeper today. Offloading continues to be an issue here although with the amount of drainage she has a total contact cast is just not going to work 2/10; moderate amount of drainage. Patient reports he cannot get his stocking on over the dressing. I told him we have to do that the nurse gave him suggestions on how to make this work. The wound is on the bottom and lateral part of his left foot. Is cultured predominantly grew low amounts of Enterococcus, E. coli and anaerobes. There were multiple resistance genes detected including extended spectrum beta lactamase, quinolone, tetracycline. I could not think of an easy oral combination to address this so for now I am going to do topical antibiotics provided by [facility] I think the main agents here are vancomycin and an aminoglycoside. We have to be  able to give him access to the wounds to get the topical antibiotic on 2/17; moderate amount of drainage this is unchanged. He has his Keystone topical antibiotic against the deep tissue culture organisms. He has been using this and changing the dressing daily. Silver alginate on the wound surface. 2/24; using Keystone antibiotic with silver alginate on the top. He had too much drainage for a total contact cast at one point although I think that is improving and I think in the next week or 2 it might be possible to replace a total contact cast I did not do this today. In general the wound surface looks healthy however he continues to have thick rims of skin and subcutaneous tissue around the wide area of the circumference which I debrided 06/04/2020 upon evaluation today patient appears to be doing well in regard to his wound. I do feel like he is showing signs of improvement. There is little bit of [place], [name] ([id number]) [id number] Page 4 of 17 callus and dead tissue around the edges of the wound as well as what appears to be a little bit of a sinus tract that is off to the side laterally I would perform debridement to clear that away today. 3/17; left lateral foot. The wound looks about the same as I remember. Not much depth surface looks healthy. No evidence of infection 3/25; left lateral foot. Wound surface looks about the same. Separating epithelium from the circumference. There really is no evidence of infection here however not making progress by my view 3/29; left lateral foot. Surface of the wound again looks reasonably healthy still thick skin and subcutaneous tissue around the wound margins. There is no evidence  of infection. One of the concerns being brought up by the nurses has again the amount of drainage vis--vis continued use of a total contact cast 4/5; left lateral foot at roughly the base of the fifth metatarsal. Nice healthy looking granulated  tissue with rims of epithelialization. The overall wound measurements are not any better but the tissue looks healthy. The only concern is the amount of drainage although he has no surrounding maceration with what we have been doing recently to absorb fluid and protect his skin. He also has lymphedema. 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 He states that the kerlix/coban under the TCC rolled down and could not get it back up. He has been using an offloading scooter and has somehow hurt his right foot using this device. This happened last week. He states that the side of his right foot developed a blister and opened. The top of his foot also has a few small open wounds he thinks is due to his socks rubbing in his shoes. He has not been using any dressings to the wound. He denies purulent drainage, fever/chills or erythema to the wounds. 4/22; patient presents for 1 week follow-up. He developed new wounds to the right foot that were evaluated at last clinic visit. He continues to have a total contact cast to the left leg and he reports no issues. He has been using silver collagen to the right foot wounds with no issues. He denies purulent drainage, fever/chills or erythema to the right foot wounds. He has no complaints today 4/25; patient presents for 1 week follow-up. He has a total contact cast of the left leg and reports no issues. He has been using silver alginate to the right foot wound. He denies purulent drainage, fever/chills or erythema to the right foot wounds. 5/2 patient presents for 1 week follow-up. T contact cast on the left. The wound which is on the base of the plantar foot at the base of the fifth metatarsal otal actually looks quite  good and dimensions continue to gradually contract. HOWEVER the area on the right lateral fifth metatarsal head is much larger than what I remember from 2 weeks ago. Once more is he has significant levels of hypergranulation. Noteworthy that he had this same hyper granulated response on his wound on the left foot at one point in time. So much so that he I thought there was an underlying fluid collection. Based on this I think this just needs debridement. 5/9; the wound on the left actually continues to be gradually smaller with a healthy surface. Slight amount of drainage and maceration of the skin around but not too bad. However he has a large wound over the right fifth metatarsal head very much in the same configuration as his left foot wound was initially. I used silver nitrate to address the hyper granulated tissue no mechanical debridement 5/16; area on the left foot did not look as healthy this week deeper thick surrounding macerated skin and subcutaneous tissue. The area on the right foot fifth met head was about the same The area on the right ankle that we identified last week is completely broken down into an open wound presumably a stocking rubbing issue 5/23; patient has  been using a total contact cast to the left side. He has been using silver alginate underneath. He has also been using silver alginate to the right foot wounds. He has no complaints today. He denies any signs of infection. 5/31; the left-sided wound looks some better measure smaller surface granulation looks better. We have been using silver alginate under the total contact cast The large area on his right fifth met head and right dorsal foot look about the same still using silver alginate 6/6; neither side is good as I was hoping although the surface area dimensions are better. A lot of maceration on his left and right foot around the wound edge. Area on the dorsal right foot looks better. He says he was traveling. I am  not sure what does the amount of maceration around the plantar wounds may be drainage issues 6/13; in general the wound surfaces look quite good on both sides. Macerated skin and raised edges around the wound required debridement although in general especially on the left the surface area seems improved. The area on the right dorsal ankle is about the same I thought this would not be such a problem to close 6/20; not much change in either wound although the one on the right looks a little better. Both wounds have thick macerated edges to the skin requiring debridements. We have been using silver alginate. The area on the dorsal right ankle is still open I thought this would be closed. 6/28; patient comes in today with a marked deterioration in the right foot wound fifth met head. Wide area of exposed bone this is a drastic change from last time. The area on the left there we have been casting is stagnant. We have been using silver alginate in both wound areas. 7/5; bone culture I did for PCR last time was positive for Pseudomonas, group B strep, Enterococcus and Staph aureus. There was no suggestion of methicillin resistance or ampicillin resistant genes. This was resistant to tetracycline however He comes into the clinic today with the area over his right plantar fifth metatarsal head which had been doing so well 2 weeks ago completely necrotic feeling bone. I do not know that this is going to be salvageable. The left foot wound is certainly no smaller but it has a better surface and is superficial. 7/8; patient called in this morning to say that his total contact cast was rubbing against his foot. He states he is doing fine overall. He denies signs of infection. 7/12; continued deterioration in the wound over the right fifth metatarsal head crumbling bone. This is not going to be salvageable. The patient agrees and wants to be referred to Dr. Doran Durand which we will attempt to arrange as soon as  possible. I am going to continue him on antibiotics as long as that takes so I will renew those today. The area on the left foot which is the base of the fifth metatarsal continues to look somewhat better. Healthy looking tissue no depth no debridement is necessary here. 7/20; the patient was kindly seen by Dr. Doran Durand of orthopedics on 10/19/2020. 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 Towards this end we have taken him out of the total contact cast on the left we will put him in a wrap with Hydrofera Blue. As I understand things surgery is planned for 7/21 7/27; patient had his  surgery last Thursday. 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 Apparently everything went well we did not still disturb that today The area on the left foot actually looks quite good. He has been much less mobile which probably explains this he did not seem to do well in the total contact cast secondary to drainage and maceration I think. 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 Fortunately there is no evidence of active infection at this time. No fevers, chills, nausea, vomiting, or diarrhea. Overall I think that he is actually doing extremely well. Nonetheless I do believe that he is staying off of this more following the surgery in his right foot that is the reason the left is doing so great. 8/16; left plantar foot wound. This looks smaller than the last time I saw this he is using Hydrofera Blue. The surgical wound on the right foot is being followed by Dr. Doran Durand we did not look at this today. He has surgical shoes on both feet 8/23; left plantar foot wound not as good this week. Surrounding macerated skin and subcutaneous tissue everything looks moist and wet. I do not think he is offloading this adequately. He  is using a surgical shoe Apparently the right foot surgical wound is not open although I did not check his foot 8/31; left plantar foot lateral aspect. Much improved this week. He has no maceration. Some improvement in the surface area of the wound but most impressively the depth is come in we are using silver alginate. [place], [name] ([id number]) [id number] Page 5 of 17 The patient is a Product/process development scientist. He is asked that we write him a letter so he can go back to work. I have also tried to see if we can write something that will allow him to limit the amount of time that he is on his foot at work. 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 Hopefully the latter can be avoided 9/6; I believe the patient missed an appointment last week. He arrives in today with a wound looking roughly the same certainly no better. Undermining laterally and also inferiorly. We used molecuLight today in training with the patient's permission.. We are using silver alginate 9/21 wound is measuring bigger this week although this may have to do with the aggressive circumferential debridement last week in response to the blush fluorescence on the MolecuLight. Culture I did last week showed significant MSSA and E. coli. I put him on Augmentin but he has not started it yet. We are also going to send this for compounded antibiotics at [facility]. There is no evidence of systemic infection 9/29; silver alginate. His Keystone arrived. He is completing Augmentin in 2 days. Offloading in a cam boot. Moderate drainage per our intake staff 10/5; using silver alginate. He has been using his [place]. He has completed his Augmentin. Per our intake nurse still a lot of drainage, far too much to consider a total contact cast. Wound measures about the same. He had the same undermining area that I defined last week from a roughly  11-3. I remove this today 10/12; using silver alginate he is using the [place]. He comes in for a nurse visit hence we are applying [employer] twice a week. Measuring slightly better today and less notable drainage. Extensive debridement  of the wound edge last time 10/18; using topical Keystone and silver alginate and a soft cast. Wound measurements about the same. Drainage was through his soft cast. We are changing this twice a week Tuesdays and Friday 10/25; comes in with moderate drainage. Still using Keystone silver alginate and a soft cast. Wound dimensions completely the same.He has a lot of edema in the left leg he has lymphedema. Asking for [name] to consider wrapping him as he cannot get his stocking on over the soft cast 11/2; comes in with moderate to large drainage slightly smaller in terms of width we have been using [place]. His wound looks satisfactory but not much improvement 11/4; patient presents today for obligatory cast change. Has no issues or complaints today. He denies signs of infection. 11/9; patient traveled this weekend to [place], was on the cast quite a bit. Staining of the cast with black material from his walking boot. Drainage was not quite as bad as we feared. Using silver alginate and Keystone 11/16; we do not have size for cast therefore we have been putting a soft cast on him since the change on Friday. Still a significant amount of drainage necessitating changing twice a week. We have been using the Keystone at cast changes either hard or soft as well as silver alginate Comes in the clinic with things actually looking fairly good improvement in width. He says his offloading is about the same 02/24/2021 upon evaluation today patient actually comes back in and is doing excellent in regard to his foot ulcer this is significantly smaller even compared to the last visit. The soft cast seems to have done extremely well for him which is great news. I do not see any signs of  infection minimal debridement will be needed today. 11/30; left lateral foot much improved half a centimeter improvement in surface area. No evidence of infection. He seems to be doing better with the soft cast in the TCC therefore we will continue with this. He comes back in later in the week for a change with the nurses. This is due to drainage 12/6; no improvement in dimensions. Under illumination some debris on the surface we have been using silver alginate, soft cast. If there is anything optimistic here he seems to have have less drainage 12/13. Dimensions are improved both length and width and slightly in depth. Appears to be quite healthy today. Raised edges of this thick skin and callus around the edges however. He is in a soft cast were bringing him back once for a change on Friday. Drainage is better 12/20. Dimensions are improved. He still has raised edges of thick skin and callus around the edges. We are using a soft cast 12/28; comes in today with thick callus around the wound. Using silver under alginate under a soft cast. I do not think there is much improvement in any measurement 2023 04/06/2021; patient was put in a total contact cast. Unfortunately not much change in surface area 1/10; not much different still thick callus and skin around the edge in spite of the total contact cast. This was just debrided last week we have been using the [facility] compounded antibiotic and silver alginate under a total contact cast 1/18 the patient's wound on the left side is doing nicely. smaller HOWEVER he comes in today with a wound on the right foot laterally. blister most likely serosangquenous drainage 1/24; the patient continues to do well in terms of the plantar left foot which is continued to  contract using silver alginate under the total contact cast HOWEVER the right lateral foot is bigger with denuded skin around the edges. I used pickups and a #15 scalpel to remove this this looks like  the remanence of a large blister. Cannot rule out infection. Culture in this area I did last week showed Staphylococcus lugdunensis few colonies. I am going to try to address this with his [employer] antibiotic that is done so well on the left having linezolid and this should cover the staph 2/1; the patient's wound on his left foot which was the original plantar foot wound thick skin and eschar around the edges even in the total contact cast but the wound surface does not look too bad The real problem is on how his right lateral foot at roughly the base of the fifth metatarsal. The wound is completely necrotic more worrisome than that there is swelling around the edges of this. We have been using silver alginate on both wounds and Keystone on the right foot. Unfortunately I think he is going to require systemic antibiotics while we await cultures. He did not get the x-ray done that we ordered last week [lost the prescription 2/7; disappointingly in the area on the left foot which we are treating with a total contact cast is still not closed although it is much smaller. He continues to have a lot of callus around the wound edge. -Right lateral foot culture I did last week was negative x-ray also negative for osteomyelitis. 2/15: TCC silver alginate on the left and silver alginate on the right lateral. No real improvement in either area 05/26/2021: T oday, the wounds are roughly the same size as at his previous visit, post-debridement. He continues to endorse fairly substantial drainage, particularly on the right. He has been in a total contact cast on the left. There is still some callus surrounding this lesion. On the right, the periwound skin is quite macerated, along with surrounding callus. The center of the right-sided wound also has some dark, densely adherent material, which is very difficult to remove. 06/02/2021: Today, both wounds are slightly smaller. He has been using zinc oxide ointment  around the right ulcer and the degree of maceration has improved markedly. There continues to be an area of nonviable tissue in the center of the right sided ulcer. The left-sided wound, which has been in the total contact cast. Appears clean and the degree of callus around it is less than previously. 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 He had to take the stairs and both wounds have increased in size. The left foot, which has been in a total contact cast, has developed a tunnel tracking to the lateral aspect of his foot. The nonviable tissue in the center of the right-sided ulcer remains recalcitrant to debridement. There is significant undermining surrounding the entirety of the left sided wound. 06/16/2021: The elevator at school has been fixed and the patient has been able to avoid putting as much weight on his wounds over the past week. We converted the left foot wound into a single lesion today, but despite this, the wound is actually smaller. The base is healthy with limited periwound callus. On the right, the central necrotic area is still present. He continues to be quite macerated around the right sided wound, despite applying barrier cream. This does, however, have the benefit of softening the callus to make it more easily removable. 06/23/2021: Today, the  left wound is smaller. The lateral aspect that had opened up previously is now closed. The wound base has a healthy bed of granulation tissue and minimal slough. Unfortunately, on the right, the wound is larger and continues to be fairly macerated. He has also reopened the wound at his right [place], [name] ([id number]) [id number] Page 6 of 17 ankle. He thinks this is due to the gait he has adopted secondary to his total contact cast and boot. 06/30/2021: T oday, both wounds are a little bit larger. The lateral aspect on the left has remained  closed. He continues to have significant periwound maceration. The culture that I took from the right sided wound grew a population of bacteria that is not covered by his current [facility] antibiotic. The center of the right- sided wound continues to appear necrotic with nonviable fat. It probes deeper today, but does not reach bone. 07/07/2021: The periwound maceration is a little bit less today. The right lateral foot wound has some areas that appear more viable and the necrotic center also looks a little bit better. The wound on the dorsal surface of his right foot near the ankle is contracting and the surface appears healthy. The left plantar wound surface looks healthy, but there is some new undermining on the medial portion. He did get his new Keystone antibiotic and began applying that to the right foot wound on Saturday. 07/14/2021: The intake nurse reported substantial drainage from his wounds, but the periwound skin actually looks better than is typical for him. The wound on the dorsal surface of his right foot near the ankle is smaller and just has a small open area underneath some dried eschar. The left plantar wound surface looks healthy and there has been no significant accumulation of callus. The right lateral foot wound looks quite a bit better, with the central portion, which typically appears necrotic, looking more viable albeit pale. 07/22/2021: His left foot is extremely macerated today. The wound is about the same size. The wound on the dorsal surface of his right foot near the ankle had closed, but he traumatized it removing the dressing and there is a tiny skin tear in that location. The right lateral foot wound is bigger, but the surface appears healthy. 07/30/2021: The wound on the dorsal surface of his right foot near the ankle is closed. The right lateral foot wound again is a little bit bigger due to some undermining. The periwound skin is in better condition, however. He has  been applying zinc oxide. The wound surface is a little bit dry today. On the left, he does not have the substantial maceration that we frequently see. The wound itself is smaller and has a clean surface. 08/06/2021: Both wounds seem to have deteriorated over the past week. The right lateral foot wound has a dry surface but the periwound is boggy.. Overall wound dimensions are about the same. On the left, the wound is about the same size, but there is more undermining present underneath periwound callus. 08/13/2021: The right sided wound looks about the same, but on the left there has been substantial deterioration. The undermining continues to extend under periwound callus. Once this was removed, substantial extension of the wound was present. There is no odor or purulent drainage but clearly the wounds have broken down. 08/20/2021: The wounds look about the same today. He has been out of his total contact cast and has just been changing the dressings using topical Keystone with PolyMem Ag, Kerlix and Ace  bandages. The wound on the top of his right ankle has reopened but this is quite small. There was a little bit of purulent material that I expressed when examining this wound. 08/24/2021: After the aggressive debridement I performed at his last visit, the wounds actually look a little bit better today. They are smaller with the exception of the wound on the top of his right ankle which is a little bit bigger as some more skin pulled off when he was changing his dressing. We are using topical Keystone with PolyMem Ag Kerlix and Ace bandages. 09/02/2021: There has been really no change to any of his wounds. 09/16/2021: The patient was hospitalized last week with nausea, vomiting, and dehydration. He says that while he was in the hospital, his wounds were not really addressed properly. T oday, both plantar foot wounds are larger and the periwound skin is macerated. The wound on the dorsum of his right foot has  a scab on the top. The right foot now has a crater where previously he had had nonviable fat. It looks as though this simply died and fell out. The periwound callus is wet. 09/24/2021: His wounds have deteriorated somewhat since his last visit. The wound on the dorsum of his right foot near his ankle is larger and has more nonviable tissue present. The crater in his right foot is even deeper; I cannot quite palpate or probe to bone but I am sure it is close. The wound on his left plantar foot has an odd boggy area in the center that almost feels as though it has fluid within it. He has run out of his topical Keystone antibiotic. We are using silver alginate on his wounds. 09/29/2021: He has developed a new wound on the dorsum of his left foot near his ankle. He says he thinks his wrapping is rubbing in that site. I would concur with this as the wound on his right ankle is larger. The left foot looks about the same. The right foot has the crater that was present last week. No significant slough accumulation, but his foot remains quite swollen and warm despite oral antibiotic therapy. 10/08/2021: All of his wounds look about the same as last week. He did not start his oral antibiotics that are prescribed until just a couple of days ago; his [employer] compounded antibiotics formula has been changed and he is awaiting delivery of the new recipe. His MRI that was scheduled for earlier this week was canceled as no prior authorization had been obtained; unfortunately the tech responsible sent an email to my old [place] email, which I no longer use nor have access to. 10/18/2021: The wounds on his bilateral dorsal feet near the ankles are both improved. They are smaller and have just some eschar and slough buildup. The left plantar wound has a fair amount of undermining, but the surface is clean. There is some periwound callus accumulation. On the right plantar foot, there is nonviable fat leading to a deep  tunnel that tracks towards his dorsal medial foot. There is periwound callus and slough accumulation, as well. His right foot and leg remain swollen as compared to the left. 10/25/2021: The wounds on his bilateral dorsal feet and at the ankles have broken down somewhat. They are little bit larger than last week. The left plantar wound continues to undermine laterally but the surface is clean. The right plantar foot wound shows some decreased depth in the tunnel tracking towards his dorsal medial foot. He  has not yet had the Doppler study that I ordered; it sounds like there is some confusion about the scheduling of the procedure. In addition, the MRI was denied and I have taken steps to appeal the denial. 11/24/2021: Since our last visit, Mr. Buckalew was admitted to the hospital where an MRI suggested osteomyelitis. He was taken the operating room by podiatry. Bone biopsies were negative for osteomyelitis. They debrided his wounds and applied myriad matrix. He saw them last week and they removed his staples. He is here today to continue his wound healing process. T oday, both of the dorsal foot/ankle wounds are substantially smaller. There is just a little eschar overlying the left sided wound and some eschar and slough on the right. The right plantar foot ulcer has the healthiest surface of granulation tissue that I have seen to date. A portion of the myriad matrix failed to take and was hanging loose. It appears that myriad morcells were placed into the tunnel closest to the dorsal portion of his foot. These have sloughed off. The left plantar foot ulcer is about the same size, but has a much healthier surface than in the past. Both plantar ulcers have callus and slough accumulation. 12/07/2021: Left dorsal foot/ankle wound is closed. The right dorsal foot/ankle wound is nearly closed and just has a small open area with some eschar and slough. The right plantar foot wound has contracted quite a bit  since our last visit. It has a healthy surface with just a little bit of slough accumulation and periwound callus buildup. The left plantar foot wound is about the same size but the surface appears healthy. There is a little slough and periwound callus on this side, as well. 12/15/2021: Both dorsal foot/ankle wounds are closed. The right plantar foot wound is substantially smaller than at our last visit. The tunneling that was present has nearly closed. There is just a little bit of slough buildup. The left plantar foot wound is also a little bit smaller today. The surface is the healthiest that I have ever seen it. Light slough and periwound callus accumulation on this side. 12/22/2021: The dorsal ankle/foot wounds remain closed. The right plantar foot wound continues to contract. There is still a bit of depth at the lateral portion of the wound but the surface has a good granulated appearance. The wound on the left is about the same size, the central indented portion is still adherent. It also is clean with good granulation tissue present. The periwound skin and callus are a bit macerated, however. 12/29/2021: Both ankle wounds remain closed. The right plantar foot wound is less than half the size that it was last week. There is no depth any longer and the surface has just a little bit of slough and periwound callus. On the left, the wound is not much smaller, but the surface is healthier with good granulation tissue. Delancy, [name] ([id number]) [id number] Page 7 of 17 There is also a little slough and periwound callus buildup. 01/05/2022: The right plantar foot wound continues to contract and is once again about half the size as it was last week. There is just a little bit of surface slough and periwound callus. On the left, the depth of the wound has decreased and the diameter has started to contract. It is also very clean with just a little slough and biofilm  present. 01/12/2022: The right plantar foot wound is smaller again today. There is just some periwound callus and slough accumulation. On the  left, there is a fair amount of undermining and the surface is a little boggy, but no other significant change. 01/19/2022: The right plantar wound continues to contract. There is minimal periwound callus accumulation and just a light layer of slough on the surface. On the left, the surface looks better, but there is fairly significant undermining near the 11:00 portion of the wound, aiming towards his great toe. The skin overlying this area is very healthy and has a good fat layer present. No malodor or purulent drainage. 01/26/2022: The right sided wound continues to contract and has just a light layer of slough on the surface. Minimal periwound callus. On the left, he still has substantial undermining and the tissue surface is not as robust as I would like to see. No malodor or purulent drainage, but he did say he had a lot of serous drainage over the weekend. 02/02/2022: The right foot wound is about a quarter of the size that it was at his last visit. There is just a little slough on the surface and some periwound callus. On the left, the undermining persists and the tissue is still more pale, but he does not seem to have had as much drainage over the last weekend. We are waiting on a wound VAC. 02/09/2022: His right foot has healed. He received the wound VAC, but did not bring it to clinic with him today. The lateral aspect of the left foot wound has come in a little, but he still has substantial undermining on the medial and distal portion of the wound. Still with macerated periwound callus. 02/16/2022: The wound VAC was applied last Friday. T oday, there has been tremendous improvement in the surface of the wound bed. The undermined portion of the wound has come in a bit. There is still some overhanging dead skin. Overall the wound looks much  better. 02/23/2022: No significant change in the overall wound dimensions, but the wound surface continues to improve and appears healthier and more robust. There is some periwound callus accumulation and overhanging skin. No concern for infection. 03/02/2022: Although the measurements taken in clinic today were unchanged, the visual appearance of the wound looks like it is smaller and the undermining/tunneling is filling with flesh. There is less periwound tissue maceration than usual. No malodor or purulent drainage. No concern for infection. 03/09/2022: The undermining has come in by couple of millimeters. The periwound is a little bit more macerated this week. No concern for infection. 12/13; substantial wound on the left plantar foot. We are using silver collagen and a wound VAC. Electronic Signature(s) Signed: 03/16/2022 5:47:10 PM By: [name] MD Entered By: [name] on 03/16/2022 09:19:28 -------------------------------------------------------------------------------- Physical Exam Details Patient Name: Date of Service: [name], [name] 03/16/2022 8:15 A M Medical Record Number: [id number] Patient Account Number: [id number] Date of Birth/Sex: Treating RN: Feb 10, 1986 (36 y.o. M) Primary Care Provider: [name] Other Clinician: Referring Provider: Treating Provider/Extender: [name] in Treatment: 124 Constitutional Sitting or standing Blood Pressure is within target range for patient.. Pulse regular and within target range for patient.Marland Kitchen Respirations regular, non-labored and within target range.. Temperature is normal and within the target range for the patient.Marland Kitchen Appears in no distress. Notes Wound exam; perhaps some contraction although this would be difficult to measure. Under illumination there is very healthy looking granulation suggesting a nice response to the wound VAC. The undermining area with a crease needs also to make sure  that there is collagen applied  to this area and I told him that. His girlfriend is changing the wound dressing. There is no evidence of surrounding infection Electronic Signature(s) Signed: 03/16/2022 5:47:10 PM By: [name] MD Entered By: [name] on 03/16/2022 09:20:30 Physician Orders Details -------------------------------------------------------------------------------- Frediani, [name] ([id number]) [id number] Page 8 of 17 Patient Name: Date of Service: [name] D 03/16/2022 8:15 A M Medical Record Number: [id number] Patient Account Number: [id number] Date of Birth/Sex: Treating RN: Apr 07, 1985 (36 y.o. 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 - Dr. [name] - Room 1 with [name] Return A ppointment in 2 weeks. - Dr .[name] Room 1 Return A Bathing/ Shower/ Hygiene May shower and wash wound with soap and water. Negative Presssure Wound Therapy Wound #3 Left,Lateral,Plantar Foot Wound Vac to wound continuously at 181m/hg pressure Black Foam Edema Control - Lymphedema / SCD / Other Bilateral Lower Extremities Elevate legs to the level of the heart or above for 30 minutes daily and/or when sitting, a frequency of: - throughout the day Avoid standing for long periods of time. Exercise regularly Compression stocking or Garment 20-30 mm/Hg pressure to: - to both legs daily Off-Loading Open toe surgical shoe to: - Both feet Additional Orders / Instructions Follow Nutritious Diet Non Wound Condition pply the following to affected area as  directed: - continue to apply foam donut or cushion to healed right plantar foot A Wound Treatment Wound #3 - Foot Wound Laterality: Plantar, Left, Lateral Cleanser: Soap and Water 3 x Per Week/30 Days Discharge Instructions: May shower and wash wound with dial antibacterial soap and water prior to dressing change. Peri-Wound Care: Skin Prep 3 x Per Week/30 Days Discharge Instructions: Use skin prep as directed Prim Dressing: Promogran Prisma Matrix, 4.34 (sq in) (silver collagen) 3 x Per Week/30 Days ary Discharge Instructions: Moisten collagen with saline or hydrogel Prim Dressing: VAC ary 3 x Per Week/30 Days Compression Wrap: Kerlix Roll 4.5x3.1 (in/yd) (Generic) 3 x Per Week/30 Days Discharge Instructions: Apply Kerlix and Coban compression as directed. Electronic Signature(s) Signed: 03/16/2022 5:46:55 PM By: DDeon PillingRN, BSN Signed: 03/16/2022 5:47:10 PM By: RLinton HamMD Entered By: DDeon Pillingon 03/16/2022 08:56:02 Problem List Details -------------------------------------------------------------------------------- Dry, CMali([id number] Page 9 of 17 Patient Name: Date of Service: AThom ChimesD 03/16/2022 8:15 A M Medical Record Number: [id number] Account Number: [id number] of Birth/Sex: Treating RN: [id number](36y.o. MHessie DienerPrimary Care Provider: PSeward CarolOther Clinician: Referring Provider: Treating Provider/Extender: RDarlyn Readin Treatment: 162Active Problems ICD-10 Encounter Code Description Active Date MDM Diagnosis E11.621 Type 2 diabetes mellitus with foot ulcer 10/24/2019 No Yes L97.528 Non-pressure chronic ulcer of other part of left foot with other specified 10/24/2019 No Yes severity Inactive Problems ICD-10 Code Description Active Date Inactive Date L97.518 Non-pressure chronic ulcer of other part of right foot with other specified  severity 07/14/2020 07/14/2020 L97.518 Non-pressure chronic ulcer of other part of right foot with other specified severity 10/24/2019 10/24/2019 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, right ankle and foot 10/24/2019 10/24/2019 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 08/10/2020 08/10/2020 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, left ankle and  foot 10/24/2019 10/24/2019 L97.322 Non-pressure chronic ulcer of left ankle with fat layer exposed 09/29/2021 09/29/2021 B95.62 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 10/24/2019 10/24/2019 classified elsewhere Resolved Problems Electronic Signature(s) Signed: 03/16/2022 5:47:10 PM By: [name] MD Entered By: [name] on 03/16/2022 09:18:48 -------------------------------------------------------------------------------- Progress Note Details Patient Name: Date of Service: [name], [name] 03/16/2022 8:15 A M Medical Record Number: [id number] Patient Account Number: [id number] Date of Birth/Sex: Treating RN: [birth year] (36 y.o. 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 He comes in for review of a wound over the plantar fifth metatarsal head extending into the lateral part of the foot. He was followed for this previously by his podiatrist Dr. Cornelius Moras. As the patient tells his story he went to see podiatry first for a swelling he developed on the lateral part of his fifth metatarsal head in May. He states this was "open" by podiatry and the area closed. He was followed up in June and it was again opened callus removed and it  closed promptly. There were plans being made for surgery on the fifth metatarsal head in June however his blood sugar was apparently too high for anesthesia. Apparently the area was debrided and opened again in June and it is never closed since. Looking over the records from podiatry I am really not able to follow this. It was clear when he was first seen it was before 5/14 at that point he already had a wound. By 5/17 the ulcer was resolved. I do not see anything about a procedure. On 5/28 noted to have pre-ulcerative moderate keratosis. X-ray noted 1/5 contracted toe and tailor's bunion and metatarsal deformity. On a visit date on 09/28/2018 the dorsal part of the left foot it healed and resolved. There was concern about swelling in his lower extremity he was sent to the ER.. As far as I can tell he was seen in the ER on 7/12 with an ulcer on his left foot. A DVT rule out of the left leg was negative. I do not think I have complete records from podiatry but I am not able to verify the procedures this patient states he had. He states after the last procedure the wound has never closed although I am not able to follow this in the records I have from podiatry. He has not had a recent x-ray The patient has been using Neosporin on the wound. He is wearing a Darco shoe. He is still very active up on his foot working and exercising. Past medical history; type 2 diabetes ketosis-prone, leg swelling with a negative DVT study in July. Non-smoker ABI in our clinic was 0.85 on the left 10/16; substantial wound on the plantar left fifth met head extending laterally almost to the dorsal fifth MTP. We have been using silver alginate we gave him a Darco forefoot off loader. An x-ray did not show evidence of osteomyelitis did note soft tissue emphysema which I think was due to gas tracking through an open wound. There is no doubt in my mind he requires an MRI 10/23; MRI not booked until 3 November at the earliest  this is largely due to his glucose sensor in the right arm. We have been using  silver alginate. There has been an improvement 10/29; I am still not exactly sure when his MRI is booked for. He says it is the third but it is the 10th in epic. This definitely needs to be done. He is running a low-grade fever today but no other symptoms. No real improvement in the 1 02/26/2019 patient presents today for a follow-up visit here in our clinic he is last been seen in the clinic on October 29. Subsequently we were working on getting MRI to evaluate and see what exactly was going on and where we would need to go from the standpoint of whether or not he had osteomyelitis and again what treatments were going be required. Subsequently the patient ended up being admitted to the hospital on 02/07/2019 and was discharged on 02/14/2019. This is a somewhat interesting admission with a discharge diagnosis of pneumonia due to COVID-19 although he was positive for COVID-19 when tested at the urgent care but negative x2 when he was actually in the hospital. With that being said he did have acute respiratory failure with hypoxia and it was noted he also have a left foot ulceration with osteomyelitis. With that being said he did require oxygen for his pneumonia and I level 4 L. He was placed on antivirals and steroids for the COVID-19. He was also transferred to the [facility] at one point. Nonetheless he did subsequently discharged home and since being home has done much better in that regard. The CT angiogram did not show any pulmonary embolism. With regard to the osteomyelitis the patient was placed on vancomycin and Zosyn while in the hospital but has been changed to Augmentin at discharge. It was also recommended that he follow- up with wound care and podiatry. Podiatry however wanted him to see [name] according to the patient prior to them doing anything further. His hemoglobin A1c was 9.9 as noted in the hospital.  Have an MRI of the left foot performed while in the hospital on 02/04/2019. 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 There is an overlying plantar open wound noted an abscess tracking back along the lateral aspect of the fifth metatarsal shaft. There is otherwise diffuse cellulitis and mild fasciitis without findings of polymyositis. The patient did have recently pneumonia secondary to COVID-19 I looked in the chart through epic and it does appear that the patient may need to have an additional x-ray just to ensure everything is cleared and that he has no airspace disease prior to putting him into the [name]. 03/05/2019; patient was readmitted to the clinic last week. He was hospitalized twice for a viral upper respiratory tract infection from 11/1 through 11/4 and then 11/5 through 11/12 ultimately this turned out to be Covid pneumonitis. Although he was discharged on oxygen he is not using it. He says he feels fine. He has no exercise limitation no cough no sputum. His O2 sat in our clinic today was 100% on room air. 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 He received Vanco and Zosyn in the hospital and then was discharged on 2 weeks of Augmentin. I do not see any relevant cultures. He was supposed to follow-up with infectious disease but I do not see that he has an appointment. 12/8; patient saw Dr. Novella Olive of infectious disease last week. He  felt that he had had adequate antibiotic therapy. He did not go to follow-up with Dr. Amalia Hailey of podiatry and I have again talked to him about the pros and cons of this. He does not want to consider a ray amputation of this time. He is aware of the risks of recurrence, migration etc. He started HBO today and tolerated this well. He can complete the  Augmentin that I gave him last week. I have looked over the lab work that Dr. Chana Bode ordered his C-reactive protein was 3.3 and his sedimentation rate was 17. The C-reactive protein is never really been measurably that high in this patient 12/15; not much change in the wound today however he has undermining along the lateral part of the foot again more extensively than last week. He has some rims of epithelialization. We have been using silver alginate. He is undergoing hyperbarics but did not dive today 12/18; in for his obligatory first total contact cast change. Unfortunately there was pus coming from the undermining area around his fifth metatarsal head. This was cultured but will preclude reapplication of a cast. He is seen in conjunction with HBO 12/24; patient had staph lugdunensis in the wound in the undermining area laterally last time. We put him on doxycycline which should have covered this. The wound looks better today. I am going to give him another week of doxycycline before reattempting the total contact cast 12/31; the patient is completing antibiotics. Hemorrhagic debris in the distal part of the wound with some undermining distally. He also had hyper granulation. Extensive debridement with a #5 curette. The infected area that was on the lateral part of the fifth met head is closed over. I do not think he needs any more antibiotics. Patient was seen prior to HBO. Preparations for a total contact cast were made in the cast will be placed post hyperbarics 04/11/19; once again the patient arrives today without complaint. He had been in a cast all week noted that he had heavy drainage this week. This resulted in large raised areas of macerated tissue around the wound 1/14; wound bed looks better slightly smaller. Hydrofera Blue has been changing himself. He had a heavy drainage last week which caused a lot of maceration around the wound so I took him out of a total contact cast he says the  drainage is actually better this week He is seen today in conjunction with HBO 1/21; returns to clinic. He was up in [place] for a day or 2 attending a funeral. He comes back in with the wound larger and with a large area of exposed bone. He had osteomyelitis and septic arthritis of the fifth left metatarsal head while he was in hospital. He received IV antibiotics in the hospital for a prolonged period of time then 3 weeks of Augmentin. Subsequently I gave him 2 weeks of doxycycline for more superficial wound infection. When I saw this last week the wound was smaller the surface of the wound looks satisfactory. 1/28; patient missed hyperbarics today. Bone biopsy I did last time showed Enterococcus faecalis and Staphylococcus lugdunensis . He has a wide area of exposed bone. We are going to use silver alginate as of today. I had another ethical discussion with the patient. 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 In this situation I think the likelihood of healing this is low. Therefore I have recommended a ray amputation and with the patient's agreement I have referred him to Dr. Doran Durand. The  other issue is that his compliance with hyperbarics has been minimal because of his work schedule and given his underlying decision I am going to stop this today READMISSION 10/24/2019 MRI 09/29/2019 left foot Umscheid, [name] ([id number]) [id number] Page 11 of 17 IMPRESSION: 1. Apparent skin ulceration inferior and lateral to the 5th metatarsal base with underlying heterogeneous T2 signal and enhancement in the subcutaneous fat. Small peripherally enhancing fluid collections along the plantar and lateral aspects of the 5th metatarsal base suspicious for abscesses. 2. Interval amputation through the mid 5th metatarsal with nonspecific low-level marrow edema and enhancement. Given the proximity to the adjacent soft tissue inflammatory  changes, osteomyelitis cannot be excluded. 3. The additional bones appear unremarkable. MRI 09/29/2019 right foot IMPRESSION: 1. Soft tissue ulceration lateral to the 5th MTP joint. There is low-level T2 hyperintensity within the 4th and 5th metatarsal heads and adjacent proximal phalanges without abnormal T1 signal or cortical destruction. These findings are nonspecific and could be seen with early marrow edema, hyperemia or early osteomyelitis. No evidence of septic joint. 2. Mild tenosynovitis and synovial enhancement associated with the extensor digitorum tendons at the level of the midfoot. 3. Diffuse low-level muscular T2 hyperintensity and enhancement, most consistent with diabetic myopathy. LEFT FOOT BONE Methicillin resistant staphylococcus aureus Staphylococcus lugdunensis MIC MIC CIPROFLOXACIN >=8 RESISTANT Resistant <=0.5 SENSI... Sensitive CLINDAMYCIN <=0.25 SENS... Sensitive >=8 RESISTANT Resistant ERYTHROMYCIN >=8 RESISTANT Resistant >=8 RESISTANT Resistant GENTAMICIN <=0.5 SENSI... Sensitive <=0.5 SENSI... Sensitive Inducible Clindamycin NEGATIVE Sensitive NEGATIVE Sensitive OXACILLIN >=4 RESISTANT Resistant 2 SENSITIVE Sensitive RIFAMPIN <=0.5 SENSI... Sensitive <=0.5 SENSI... Sensitive TETRACYCLINE <=1 SENSITIVE Sensitive <=1 SENSITIVE Sensitive TRIMETH/SULFA <=10 SENSIT Sensitive <=10 SENSIT Sensitive ... Marland Kitchen.. VANCOMYCIN 1 SENSITIVE Sensitive <=0.5 SENSI... Sensitive Right foot bone . Component 3 wk ago Specimen Description BONE Special Requests RIGHT 4 METATARSAL SAMPLE B Gram Stain NO WBC SEEN NO ORGANISMS SEEN Culture RARE METHICILLIN RESISTANT STAPHYLOCOCCUS AUREUS NO ANAEROBES ISOLATED Performed at [facility], [place] [address] [place], [place] [address] Report Status 10/08/2019 FINAL Organism ID, Bacteria METHICILLIN RESISTANT STAPHYLOCOCCUS AUREUS Resulting Agency [facility] Susceptibility Methicillin resistant staphylococcus  aureus MIC CIPROFLOXACIN >=8 RESISTANT Resistant CLINDAMYCIN <=0.25 SENS... Sensitive ERYTHROMYCIN >=8 RESISTANT Resistant GENTAMICIN <=0.5 SENSI... Sensitive Inducible Clindamycin NEGATIVE Sensitive OXACILLIN >=4 RESISTANT Resistant RIFAMPIN <=0.5 SENSI... Sensitive TETRACYCLINE <=1 SENSITIVE Sensitive TRIMETH/SULFA <=10 SENSIT Sensitive ... VANCOMYCIN 1 SENSITIVE Sensitive This is a patient we had in clinic earlier this year with a wound over his left fifth metatarsal head. 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 Doran Durand on 05/16/2019. He seems to have developed problems with wounds on his bilateral feet in June 2021 although he may have had problems earlier than this. He was in an urgent care with a right foot ulcer on 09/26/2019 and given a course of doxycycline. This was apparently after having trouble getting into see orthopedics. He was seen by podiatry on 09/28/2019 noted to have bilateral lower extremity ulcers including the left lateral fifth metatarsal base and the right subfifth met head. It was noted that had purulent drainage at that time. He required hospitalization from 6/20 through 7/2. This was because of worsening right foot wounds. He underwent bilateral operative incision and drainage and bone biopsies bilaterally. Culture results are listed above.  He has been referred back to clinic by Dr. Jacqualyn Posey of podiatry. He is also followed by Dr. Megan Salon who saw him yesterday. He was discharged from hospital on Zyvox Flagyl and Levaquin and yesterday changed to doxycycline Flagyl and Levaquin. His inflammatory markers on 6/26 showed a sedimentation rate of 129 and a C-reactive protein of 5. This is  improved to 14 and 1.3 respectively. This would indicate improvement. ABIs in our clinic today were 1.23 on the right and 1.20 on the left 11/01/2019 on evaluation today patient appears to be doing fairly well in regard to the wounds on his feet at this point. Fortunately there is no signs of active infection at this time. No fevers, chills, nausea, vomiting, or diarrhea. He currently is seeing infectious disease and still under their care at this point. Subsequently he also has both wounds which she has not been using collagen on as he did not receive that in his packaging he did not call us and let us know that. Apparently that just was missed on the order. Nonetheless we will get that straightened out today. 8/9-Patient returns for bilateral foot wounds, using Prisma with hydrogel moistened dressings, and the wounds appear stable. Patient using surgical shoes, avoiding much pressure or weightbearing as much as possible 8/16; patient has bilateral foot wounds. 1 on the right lateral foot proximally the other is on the left mid lateral foot. Both required debridement of callus and Vroom, [name] ([id number]) [id number] Page 12 of 17 thick skin around the wounds. We have been using silver collagen 8/27; patient has bilateral lateral foot wounds. The area on the left substantially surrounded by callus and dry skin. This was removed from the wound edge. The underlying wound is small. The area on the right measured somewhat smaller today. 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 Unfortunately I did not update his antibiotics during today's visit. 9/10 I reviewed Dr. Hale Bogus last notes he felt he had completed antibiotics his inflammatory markers were reasonably well controlled. He has a small wound on the lateral left foot and a tiny area on the right which is just above closed. He is using Hydrofera  Blue with border foam he has bilateral surgical shoes 9/24; 2 week f/u. doing well. right foot is closed. left foot still undermined. 10/14; right foot remains closed at the fifth met head. The area over the base of the left fifth metatarsal has a small open area but considerable undermining towards the plantar foot. Thick callus skin around this suggests an adequate pressure relief. We have talked about this. He says he is going to go back into his cam boot. I suggested a total contact cast he did not seem enamored with this suggestion 10/26; left foot base of the fifth metatarsal. Same condition as last time. He has skin over the area with an open wound however the skin is not adherent. He went to see Dr. Earleen Newport who did an x-ray and culture of his foot I have not reviewed the x-ray but the patient was not told anything. He is on doxycycline 11/11; since the patient was last here he was in the emergency room on 10/30 he was concerned about swelling in the left foot. They did not do any cultures or x-rays. They changed his antibiotics to cephalexin. Previous culture showed group B strep. The cephalexin is appropriate as doxycycline has less than predictable coverage. Arrives in clinic today with swelling over this  area under the wound. He also has a new wound on the right fifth metatarsal head 11/18; the patient has a difficult wound on the lateral aspect of the left fifth metatarsal head. The wound was almost ballotable last week I opened it slightly expecting to see purulence however there was just bleeding. I cultured this this was negative. X-ray unchanged. We are trying to get an MRI but I am not sure were going to be able to get this through his insurance. He also has an area on the right lateral fifth metatarsal head this looks healthier 12/3; the patient finally got our MRI. Surprisingly this did not show osteomyelitis. I did show the soft tissue ulceration at the lateral plantar aspect of the  fifth metatarsal base with a tiny residual 6 mm abscess overlying the superficial fascia I have tried to culture this area I have not been able to get this to grow anything. Nevertheless the protruding tissue looks aggravated. I suspect we should try to treat the underlying "abscess with broad-spectrum antibiotics. I am going to start him on Levaquin and Flagyl. He has much less edema in his legs and I am going to continue to wrap his legs and see him weekly 12/10. I started Levaquin and Flagyl on him last week. He just picked up the Flagyl apparently there was some delay. The worry is the wound on the left fifth metatarsal base which is substantial and worsening. His foot looks like he inverts at the ankle making this a weightbearing surface. Certainly no improvement in fact I think the measurements of this are somewhat worse. We have been using 12/17; he apparently just got the Levaquin yesterday this is 2 weeks after the fact. He has completed the Flagyl. The area over the left fifth metatarsal base still has protruding granulation tissue although it does not look quite as bad as it did some weeks ago. He has severe bilateral lymphedema although we have not been treating him for wounds on his legs this is definitely going to require compression. There was so much edema in the left I did not wish to put him in a total contact cast today. I am going to increase his compression from 3-4 layer. The area on the right lateral fifth met head actually look quite good and superficial. 12/23; patient arrived with callus on the right fifth met head and the substantial hyper granulated callused wound on the base of his fifth metatarsal. He says he is completing his Levaquin in 2 days but I do not think that adds up with what I gave him but I will have to double check this. We are using Hydrofera Blue on both areas. My plan is to put the left leg in a cast the week after New Year's 04/06/2020; patient's wounds  about the same. Right lateral fifth metatarsal head and left lateral foot over the base of the fifth metatarsal. There is undermining on the left lateral foot which I removed before application of total contact cast continuing with Hydrofera Blue new. Patient tells me he was seen by endocrinology today lab work was done [Dr. Kerr]. Also wondering whether he was referred to cardiology. I went over some lab work from previously does not have chronic renal failure certainly not nephrotic range proteinuria he does have very poorly controlled diabetes but this is not his most updated lab work. Hemoglobin A1c has been over 11 1/10; the patient had a considerable amount of leakage towards mid part of his left  foot with macerated skin however the wound surface looks better the area on the right lateral fifth met head is better as well. I am going to change the dressing on the left foot under the total contact cast to silver alginate, continue with Hydrofera Blue on the right. 1/20; patient was in the total contact cast for 10 days. Considerable amount of drainage although the skin around the wound does not look too bad on the left foot. The area on the right fifth metatarsal head is closed. Our nursing staff reports large amount of drainage out of the left lateral foot wound 1/25; continues with copious amounts of drainage described by our intake staff. PCR culture I did last week showed E. coli and Enterococcus faecalis and low quantities. Multiple resistance genes documented including extended spectrum beta lactamase, MRSA, MRSE, quinolone, tetracycline. The wound is not quite as good this week as it was 5 days ago but about the same size 2/3; continues with copious amounts of malodorous drainage per our intake nurse. The PCR culture I did 2 weeks ago showed E. coli and low quantities of Enterococcus. There were multiple resistance genes detected. I put Neosporin on him last week although this does not seem  to have helped. The wound is slightly deeper today. Offloading continues to be an issue here although with the amount of drainage she has a total contact cast is just not going to work 2/10; moderate amount of drainage. Patient reports he cannot get his stocking on over the dressing. I told him we have to do that the nurse gave him suggestions on how to make this work. The wound is on the bottom and lateral part of his left foot. Is cultured predominantly grew low amounts of Enterococcus, E. coli and anaerobes. There were multiple resistance genes detected including extended spectrum beta lactamase, quinolone, tetracycline. I could not think of an easy oral combination to address this so for now I am going to do topical antibiotics provided by [facility] I think the main agents here are vancomycin and an aminoglycoside. We have to be able to give him access to the wounds to get the topical antibiotic on 2/17; moderate amount of drainage this is unchanged. He has his Keystone topical antibiotic against the deep tissue culture organisms. He has been using this and changing the dressing daily. Silver alginate on the wound surface. 2/24; using Keystone antibiotic with silver alginate on the top. He had too much drainage for a total contact cast at one point although I think that is improving and I think in the next week or 2 it might be possible to replace a total contact cast I did not do this today. In general the wound surface looks healthy however he continues to have thick rims of skin and subcutaneous tissue around the wide area of the circumference which I debrided 06/04/2020 upon evaluation today patient appears to be doing well in regard to his wound. I do feel like he is showing signs of improvement. There is little bit of callus and dead tissue around the edges of the wound as well as what appears to be a little bit of a sinus tract that is off to the side laterally I would perform debridement to  clear that away today. 3/17; left lateral foot. The wound looks about the same as I remember. Not much depth surface looks healthy. No evidence of infection 3/25; left lateral foot. Wound surface looks about the same. Separating epithelium from the circumference.  There really is no evidence of infection here however not making progress by my view 3/29; left lateral foot. Surface of the wound again looks reasonably healthy still thick skin and subcutaneous tissue around the wound margins. There is no evidence of infection. One of the concerns being brought up by the nurses has again the amount of drainage vis--vis continued use of a total contact cast 4/5; left lateral foot at roughly the base of the fifth metatarsal. Nice healthy looking granulated tissue with rims of epithelialization. The overall wound measurements are not any better but the tissue looks healthy. The only concern is the amount of drainage although he has no surrounding maceration with what we have been doing recently to absorb fluid and protect his skin. He also has lymphedema. 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 He states that the kerlix/coban under the TCC rolled down and could not get it back up. He has been using an offloading scooter and has somehow hurt his right foot using this device. This happened last week. He states that the side of his right foot developed a blister and opened. The top of his foot also has a few small open wounds he thinks is due to his socks rubbing in his shoes. He has not been using any dressings to the wound. He denies purulent drainage, fever/chills or erythema to the wounds. 4/22; patient presents for 1 week follow-up. He developed new wounds to  the right foot that were evaluated at last clinic visit. He continues to have a total contact cast to the left leg and he reports no issues. He has been using silver collagen to the right foot wounds with no issues. He denies purulent drainage, fever/chills or erythema to the right foot wounds. He has no complaints today Rodwell, [name] ([id number]) [id number] Page 13 of 17 4/25; patient presents for 1 week follow-up. He has a total contact cast of the left leg and reports no issues. He has been using silver alginate to the right foot wound. He denies purulent drainage, fever/chills or erythema to the right foot wounds. 5/2 patient presents for 1 week follow-up. T contact cast on the left. The wound which is on the base of the plantar foot at the base of the fifth metatarsal otal actually looks quite good and dimensions continue to gradually contract. HOWEVER the area on the right lateral fifth metatarsal head is much larger than what I remember from 2 weeks ago. Once more is he has significant levels of hypergranulation. Noteworthy that he had this same hyper granulated response on his wound on the left foot at one point in time. So much so that he I thought there was an underlying fluid collection. Based on this I think this just needs debridement. 5/9; the wound on the left actually continues to be gradually smaller with a healthy surface. Slight amount of drainage and maceration of the skin around but not too bad. However he has a large wound over the right fifth metatarsal head very much in the same configuration as his left foot wound was initially. I used silver nitrate to address the hyper granulated tissue no mechanical debridement 5/16; area on the left foot did not look as healthy this week  deeper thick surrounding macerated skin and subcutaneous tissue. oo The area on the right foot fifth met head was about the same oo The area on the right ankle that we  identified last week is completely broken down into an open wound presumably a stocking rubbing issue 5/23; patient has been using a total contact cast to the left side. He has been using silver alginate underneath. He has also been using silver alginate to the right foot wounds. He has no complaints today. He denies any signs of infection. 5/31; the left-sided wound looks some better measure smaller surface granulation looks better. We have been using silver alginate under the total contact cast oo The large area on his right fifth met head and right dorsal foot look about the same still using silver alginate 6/6; neither side is good as I was hoping although the surface area dimensions are better. A lot of maceration on his left and right foot around the wound edge. Area on the dorsal right foot looks better. He says he was traveling. I am not sure what does the amount of maceration around the plantar wounds may be drainage issues 6/13; in general the wound surfaces look quite good on both sides. Macerated skin and raised edges around the wound required debridement although in general especially on the left the surface area seems improved. oo The area on the right dorsal ankle is about the same I thought this would not be such a problem to close 6/20; not much change in either wound although the one on the right looks a little better. Both wounds have thick macerated edges to the skin requiring debridements. We have been using silver alginate. The area on the dorsal right ankle is still open I thought this would be closed. 6/28; patient comes in today with a marked deterioration in the right foot wound fifth met head. Wide area of exposed bone this is a drastic change from last time. The area on the left there we have been casting is stagnant. We have been using silver alginate in both wound areas. 7/5; bone culture I did for PCR last time was positive for Pseudomonas, group B strep, Enterococcus  and Staph aureus. There was no suggestion of methicillin resistance or ampicillin resistant genes. This was resistant to tetracycline however He comes into the clinic today with the area over his right plantar fifth metatarsal head which had been doing so well 2 weeks ago completely necrotic feeling bone. I do not know that this is going to be salvageable. The left foot wound is certainly no smaller but it has a better surface and is superficial. 7/8; patient called in this morning to say that his total contact cast was rubbing against his foot. He states he is doing fine overall. He denies signs of infection. 7/12; continued deterioration in the wound over the right fifth metatarsal head crumbling bone. This is not going to be salvageable. The patient agrees and wants to be referred to Dr. Doran Durand which we will attempt to arrange as soon as possible. I am going to continue him on antibiotics as long as that takes so I will renew those today. The area on the left foot which is the base of the fifth metatarsal continues to look somewhat better. Healthy looking tissue no depth no debridement is necessary here. 7/20; the patient was kindly seen by Dr. Doran Durand of orthopedics on 10/19/2020. He agreed that he needed a ray amputation on the right and he said he  would have a look at the fourth as well while he was intraoperative. Towards this end we have taken him out of the total contact cast on the left we will put him in a wrap with Hydrofera Blue. As I understand things surgery is planned for 7/21 7/27; patient had his surgery last Thursday. 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 Apparently everything went well we did not still disturb that today The area on the left foot actually looks quite good. He has been much less mobile which probably explains this he did not seem to do well in the total contact cast secondary to drainage and maceration I think. We have been using Hydrofera Blue 11/09/2020 upon  evaluation today patient appears to be doing well with regard to his plantar foot ulcer on the left foot. Fortunately there is no evidence of active infection at this time. No fevers, chills, nausea, vomiting, or diarrhea. Overall I think that he is actually doing extremely well. Nonetheless I do believe that he is staying off of this more following the surgery in his right foot that is the reason the left is doing so great. 8/16; left plantar foot wound. This looks smaller than the last time I saw this he is using Hydrofera Blue. The surgical wound on the right foot is being followed by Dr. Doran Durand we did not look at this today. He has surgical shoes on both feet 8/23; left plantar foot wound not as good this week. Surrounding macerated skin and subcutaneous tissue everything looks moist and wet. I do not think he is offloading this adequately. He is using a surgical shoe Apparently the right foot surgical wound is not open although I did not check his foot 8/31; left plantar foot lateral aspect. Much improved this week. He has no maceration. Some improvement in the surface area of the wound but most impressively the depth is come in we are using silver alginate. The patient is a Product/process development scientist. He is asked that we write him a letter so he can go back to work. I have also tried to see if we can write something that will allow him to limit the amount of time that he is on his foot at work. 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 Hopefully the latter can be avoided 9/6; I believe the patient missed an appointment last week. He arrives in today with a wound looking roughly the same certainly no better. Undermining laterally and also inferiorly. We used molecuLight today in training with the patient's permission.. We are using silver alginate 9/21 wound is measuring bigger this week although this may have to do with  the aggressive circumferential debridement last week in response to the blush fluorescence on the MolecuLight. Culture I did last week showed significant MSSA and E. coli. I put him on Augmentin but he has not started it yet. We are also going to send this for compounded antibiotics at [facility]. There is no evidence of systemic infection 9/29; silver alginate. His Keystone arrived. He is completing Augmentin in 2 days. Offloading in a cam boot. Moderate drainage per our intake staff 10/5; using silver alginate. He has been using his [place]. He has completed his Augmentin. Per our intake nurse still a lot of drainage, far too much to consider a total contact cast. Wound measures about the same. He had the same undermining area that I defined  last week from a roughly 11-3. I remove this today 10/12; using silver alginate he is using the [place]. He comes in for a nurse visit hence we are applying [employer] twice a week. Measuring slightly better today and less notable drainage. Extensive debridement of the wound edge last time 10/18; using topical Keystone and silver alginate and a soft cast. Wound measurements about the same. Drainage was through his soft cast. We are changing this twice a week Tuesdays and Friday 10/25; comes in with moderate drainage. Still using Keystone silver alginate and a soft cast. Wound dimensions completely the same.He has a lot of edema in the left leg he has lymphedema. Asking for [name] to consider wrapping him as he cannot get his stocking on over the soft cast 11/2; comes in with moderate to large drainage slightly smaller in terms of width we have been using [place]. His wound looks satisfactory but not much improvement 11/4; patient presents today for obligatory cast change. Has no issues or complaints today. He denies signs of infection. 11/9; patient traveled this weekend to [place], was on the cast quite a bit. Staining of the cast with black material from his walking  boot. Drainage was not quite as [place], [name] ([id number]) [id number] Page 14 of 17 bad as we feared. Using silver alginate and Keystone 11/16; we do not have size for cast therefore we have been putting a soft cast on him since the change on Friday. Still a significant amount of drainage necessitating changing twice a week. We have been using the Keystone at cast changes either hard or soft as well as silver alginate Comes in the clinic with things actually looking fairly good improvement in width. He says his offloading is about the same 02/24/2021 upon evaluation today patient actually comes back in and is doing excellent in regard to his foot ulcer this is significantly smaller even compared to the last visit. The soft cast seems to have done extremely well for him which is great news. I do not see any signs of infection minimal debridement will be needed today. 11/30; left lateral foot much improved half a centimeter improvement in surface area. No evidence of infection. He seems to be doing better with the soft cast in the [facility] therefore we will continue with this. He comes back in later in the week for a change with the nurses. This is due to drainage 12/6; no improvement in dimensions. Under illumination some debris on the surface we have been using silver alginate, soft cast. If there is anything optimistic here he seems to have have less drainage 12/13. Dimensions are improved both length and width and slightly in depth. Appears to be quite healthy today. Raised edges of this thick skin and callus around the edges however. He is in a soft cast were bringing him back once for a change on Friday. Drainage is better 12/20. Dimensions are improved. He still has raised edges of thick skin and callus around the edges. We are using a soft cast 12/28; comes in today with thick callus around the wound. Using silver under alginate under a soft cast. I do not think there  is much improvement in any measurement 2023 04/06/2021; patient was put in a total contact cast. Unfortunately not much change in surface area 1/10; not much different still thick callus and skin around the edge in spite of the total contact cast. This was just debrided last week we have been using the [facility] compounded antibiotic and silver alginate  under a total contact cast 1/18 the patient's wound on the left side is doing nicely. smaller HOWEVER he comes in today with a wound on the right foot laterally. blister most likely serosangquenous drainage 1/24; the patient continues to do well in terms of the plantar left foot which is continued to contract using silver alginate under the total contact cast HOWEVER the right lateral foot is bigger with denuded skin around the edges. I used pickups and a #15 scalpel to remove this this looks like the remanence of a large blister. Cannot rule out infection. Culture in this area I did last week showed Staphylococcus lugdunensis few colonies. I am going to try to address this with his [employer] antibiotic that is done so well on the left having linezolid and this should cover the staph 2/1; the patient's wound on his left foot which was the original plantar foot wound thick skin and eschar around the edges even in the total contact cast but the wound surface does not look too bad The real problem is on how his right lateral foot at roughly the base of the fifth metatarsal. The wound is completely necrotic more worrisome than that there is swelling around the edges of this. We have been using silver alginate on both wounds and Keystone on the right foot. Unfortunately I think he is going to require systemic antibiotics while we await cultures. He did not get the x-ray done that we ordered last week [lost the prescription 2/7; disappointingly in the area on the left foot which we are treating with a total contact cast is still not closed although it is much  smaller. He continues to have a lot of callus around the wound edge. -Right lateral foot culture I did last week was negative x-ray also negative for osteomyelitis. 2/15: TCC silver alginate on the left and silver alginate on the right lateral. No real improvement in either area 05/26/2021: T oday, the wounds are roughly the same size as at his previous visit, post-debridement. He continues to endorse fairly substantial drainage, particularly on the right. He has been in a total contact cast on the left. There is still some callus surrounding this lesion. On the right, the periwound skin is quite macerated, along with surrounding callus. The center of the right-sided wound also has some dark, densely adherent material, which is very difficult to remove. 06/02/2021: Today, both wounds are slightly smaller. He has been using zinc oxide ointment around the right ulcer and the degree of maceration has improved markedly. There continues to be an area of nonviable tissue in the center of the right sided ulcer. The left-sided wound, which has been in the total contact cast. Appears clean and the degree of callus around it is less than previously. 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 He had to take the stairs and both wounds have increased in size. The left foot, which has been in a total contact cast, has developed a tunnel tracking to the lateral aspect of his foot. The nonviable tissue in the center of the right-sided ulcer remains recalcitrant to debridement. There is significant undermining surrounding the entirety of the left sided wound. 06/16/2021: The elevator at school has been fixed and the patient has been able to avoid putting as much weight on his wounds over the past week. We converted the left foot wound into a single lesion today, but despite this, the wound is actually smaller.  The base is healthy with limited periwound  callus. On the right, the central necrotic area is still present. He continues to be quite macerated around the right sided wound, despite applying barrier cream. This does, however, have the benefit of softening the callus to make it more easily removable. 06/23/2021: Today, the left wound is smaller. The lateral aspect that had opened up previously is now closed. The wound base has a healthy bed of granulation tissue and minimal slough. Unfortunately, on the right, the wound is larger and continues to be fairly macerated. He has also reopened the wound at his right ankle. He thinks this is due to the gait he has adopted secondary to his total contact cast and boot. 06/30/2021: T oday, both wounds are a little bit larger. The lateral aspect on the left has remained closed. He continues to have significant periwound maceration. The culture that I took from the right sided wound grew a population of bacteria that is not covered by his current [facility] antibiotic. The center of the right- sided wound continues to appear necrotic with nonviable fat. It probes deeper today, but does not reach bone. 07/07/2021: The periwound maceration is a little bit less today. The right lateral foot wound has some areas that appear more viable and the necrotic center also looks a little bit better. The wound on the dorsal surface of his right foot near the ankle is contracting and the surface appears healthy. The left plantar wound surface looks healthy, but there is some new undermining on the medial portion. He did get his new Keystone antibiotic and began applying that to the right foot wound on Saturday. 07/14/2021: The intake nurse reported substantial drainage from his wounds, but the periwound skin actually looks better than is typical for him. The wound on the dorsal surface of his right foot near the ankle is smaller and just has a small open area underneath some dried eschar. The left plantar wound surface  looks healthy and there has been no significant accumulation of callus. The right lateral foot wound looks quite a bit better, with the central portion, which typically appears necrotic, looking more viable albeit pale. 07/22/2021: His left foot is extremely macerated today. The wound is about the same size. The wound on the dorsal surface of his right foot near the ankle had closed, but he traumatized it removing the dressing and there is a tiny skin tear in that location. The right lateral foot wound is bigger, but the surface appears healthy. 07/30/2021: The wound on the dorsal surface of his right foot near the ankle is closed. The right lateral foot wound again is a little bit bigger due to some undermining. The periwound skin is in better condition, however. He has been applying zinc oxide. The wound surface is a little bit dry today. On the left, he does not have the substantial maceration that we frequently see. The wound itself is smaller and has a clean surface. 08/06/2021: Both wounds seem to have deteriorated over the past week. The right lateral foot wound has a dry surface but the periwound is boggy.Nai Dasch, [name] ([id number]) [id number] Page 15 of 17 wound dimensions are about the same. On the left, the wound is about the same size, but there is more undermining present underneath periwound callus. 08/13/2021: The right sided wound looks about the same, but on the left there has been substantial deterioration. The undermining continues to extend under periwound callus. Once this was removed, substantial extension  of the wound was present. There is no odor or purulent drainage but clearly the wounds have broken down. 08/20/2021: The wounds look about the same today. He has been out of his total contact cast and has just been changing the dressings using topical Keystone with PolyMem Ag, Kerlix and Ace bandages. The wound on the top of his right ankle has  reopened but this is quite small. There was a little bit of purulent material that I expressed when examining this wound. 08/24/2021: After the aggressive debridement I performed at his last visit, the wounds actually look a little bit better today. They are smaller with the exception of the wound on the top of his right ankle which is a little bit bigger as some more skin pulled off when he was changing his dressing. We are using topical Keystone with PolyMem Ag Kerlix and Ace bandages. 09/02/2021: There has been really no change to any of his wounds. 09/16/2021: The patient was hospitalized last week with nausea, vomiting, and dehydration. He says that while he was in the hospital, his wounds were not really addressed properly. T oday, both plantar foot wounds are larger and the periwound skin is macerated. The wound on the dorsum of his right foot has a scab on the top. The right foot now has a crater where previously he had had nonviable fat. It looks as though this simply died and fell out. The periwound callus is wet. 09/24/2021: His wounds have deteriorated somewhat since his last visit. The wound on the dorsum of his right foot near his ankle is larger and has more nonviable tissue present. The crater in his right foot is even deeper; I cannot quite palpate or probe to bone but I am sure it is close. The wound on his left plantar foot has an odd boggy area in the center that almost feels as though it has fluid within it. He has run out of his topical Keystone antibiotic. We are using silver alginate on his wounds. 09/29/2021: He has developed a new wound on the dorsum of his left foot near his ankle. He says he thinks his wrapping is rubbing in that site. I would concur with this as the wound on his right ankle is larger. The left foot looks about the same. The right foot has the crater that was present last week. No significant slough accumulation, but his foot remains quite swollen and warm  despite oral antibiotic therapy. 10/08/2021: All of his wounds look about the same as last week. He did not start his oral antibiotics that are prescribed until just a couple of days ago; his [employer] compounded antibiotics formula has been changed and he is awaiting delivery of the new recipe. His MRI that was scheduled for earlier this week was canceled as no prior authorization had been obtained; unfortunately the tech responsible sent an email to my old [place] email, which I no longer use nor have access to. 10/18/2021: The wounds on his bilateral dorsal feet near the ankles are both improved. They are smaller and have just some eschar and slough buildup. The left plantar wound has a fair amount of undermining, but the surface is clean. There is some periwound callus accumulation. On the right plantar foot, there is nonviable fat leading to a deep tunnel that tracks towards his dorsal medial foot. There is periwound callus and slough accumulation, as well. His right foot and leg remain swollen as compared to the left. 10/25/2021: The wounds on  his bilateral dorsal feet and at the ankles have broken down somewhat. They are little bit larger than last week. The left plantar wound continues to undermine laterally but the surface is clean. The right plantar foot wound shows some decreased depth in the tunnel tracking towards his dorsal medial foot. He has not yet had the Doppler study that I ordered; it sounds like there is some confusion about the scheduling of the procedure. In addition, the MRI was denied and I have taken steps to appeal the denial. 11/24/2021: Since our last visit, Mr. Vancott was admitted to the hospital where an MRI suggested osteomyelitis. He was taken the operating room by podiatry. Bone biopsies were negative for osteomyelitis. They debrided his wounds and applied myriad matrix. He saw them last week and they removed his staples. He is here today to continue his wound  healing process. T oday, both of the dorsal foot/ankle wounds are substantially smaller. There is just a little eschar overlying the left sided wound and some eschar and slough on the right. The right plantar foot ulcer has the healthiest surface of granulation tissue that I have seen to date. A portion of the myriad matrix failed to take and was hanging loose. It appears that myriad morcells were placed into the tunnel closest to the dorsal portion of his foot. These have sloughed off. The left plantar foot ulcer is about the same size, but has a much healthier surface than in the past. Both plantar ulcers have callus and slough accumulation. 12/07/2021: Left dorsal foot/ankle wound is closed. The right dorsal foot/ankle wound is nearly closed and just has a small open area with some eschar and slough. The right plantar foot wound has contracted quite a bit since our last visit. It has a healthy surface with just a little bit of slough accumulation and periwound callus buildup. The left plantar foot wound is about the same size but the surface appears healthy. There is a little slough and periwound callus on this side, as well. 12/15/2021: Both dorsal foot/ankle wounds are closed. The right plantar foot wound is substantially smaller than at our last visit. The tunneling that was present has nearly closed. There is just a little bit of slough buildup. The left plantar foot wound is also a little bit smaller today. The surface is the healthiest that I have ever seen it. Light slough and periwound callus accumulation on this side. 12/22/2021: The dorsal ankle/foot wounds remain closed. The right plantar foot wound continues to contract. There is still a bit of depth at the lateral portion of the wound but the surface has a good granulated appearance. The wound on the left is about the same size, the central indented portion is still adherent. It also is clean with good granulation tissue present. The  periwound skin and callus are a bit macerated, however. 12/29/2021: Both ankle wounds remain closed. The right plantar foot wound is less than half the size that it was last week. There is no depth any longer and the surface has just a little bit of slough and periwound callus. On the left, the wound is not much smaller, but the surface is healthier with good granulation tissue. There is also a little slough and periwound callus buildup. 01/05/2022: The right plantar foot wound continues to contract and is once again about half the size as it was last week. There is just a little bit of surface slough and periwound callus. On the left, the depth of the  wound has decreased and the diameter has started to contract. It is also very clean with just a little slough and biofilm present. 01/12/2022: The right plantar foot wound is smaller again today. There is just some periwound callus and slough accumulation. On the left, there is a fair amount of undermining and the surface is a little boggy, but no other significant change. 01/19/2022: The right plantar wound continues to contract. There is minimal periwound callus accumulation and just a light layer of slough on the surface. On the left, the surface looks better, but there is fairly significant undermining near the 11:00 portion of the wound, aiming towards his great toe. The skin overlying this area is very healthy and has a good fat layer present. No malodor or purulent drainage. 01/26/2022: The right sided wound continues to contract and has just a light layer of slough on the surface. Minimal periwound callus. On the left, he still has substantial undermining and the tissue surface is not as robust as I would like to see. No malodor or purulent drainage, but he did say he had a lot of serous drainage over the weekend. 02/02/2022: The right foot wound is about a quarter of the size that it was at his last visit. There is just a little slough on the  surface and some periwound callus. On the left, the undermining persists and the tissue is still more pale, but he does not seem to have had as much drainage over the last weekend. We are waiting on a wound VAC. 02/09/2022: His right foot has healed. He received the wound VAC, but did not bring it to clinic with him today. The lateral aspect of the left foot wound has come in a little, but he still has substantial undermining on the medial and distal portion of the wound. Still with macerated periwound callus. 02/16/2022: The wound VAC was applied last Friday. Today, there has been tremendous improvement in the surface of the wound bed. The undermined portion [place], [name] ([id number]) [id number] Page 16 of 17 of the wound has come in a bit. There is still some overhanging dead skin. Overall the wound looks much better. 02/23/2022: No significant change in the overall wound dimensions, but the wound surface continues to improve and appears healthier and more robust. There is some periwound callus accumulation and overhanging skin. No concern for infection. 03/02/2022: Although the measurements taken in clinic today were unchanged, the visual appearance of the wound looks like it is smaller and the undermining/tunneling is filling with flesh. There is less periwound tissue maceration than usual. No malodor or purulent drainage. No concern for infection. 03/09/2022: The undermining has come in by couple of millimeters. The periwound is a little bit more macerated this week. No concern for infection. 12/13; substantial wound on the left plantar foot. We are using silver collagen and a wound VAC. Objective Constitutional Sitting or standing Blood Pressure is within target range for patient.. Pulse regular and within target range for patient.Marland Kitchen Respirations regular, non-labored and within target range.. Temperature is normal and within the target range for the patient.Marland Kitchen Appears  in no distress. Vitals Time Taken: 8:30 AM, Height: 77 in, Weight: 280 lbs, BMI: 33.2, Temperature: 98.3 F, Pulse: 101 bpm, Respiratory Rate: 20 breaths/min, Blood Pressure: 135/84 mmHg, Capillary Blood Glucose: 101 mg/dl. General Notes: Wound exam; perhaps some contraction although this would be difficult to measure. Under illumination there is very healthy looking granulation suggesting a nice response to the wound VAC. The undermining  area with a crease needs also to make sure that there is collagen applied to this area and I told him that. His girlfriend is changing the wound dressing. There is no evidence of surrounding infection Integumentary (Hair, Skin) Wound #3 status is Open. Original cause of wound was Trauma. The date acquired was: 10/02/2019. The wound has been in treatment 124 weeks. The wound is located on the [place]. The wound measures 3.3cm length x 3.8cm width x 1.5cm depth; 9.849cm^2 area and 14.773cm^3 volume. There is Fat Layer (Subcutaneous Tissue) exposed. There is no tunneling noted, however, there is undermining starting at 9:00 and ending at 12:00 with a maximum distance of 1.2cm. There is a medium amount of serosanguineous drainage noted. The wound margin is distinct with the outline attached to the wound base. There is large (67-100%) red granulation within the wound bed. There is a small (1-33%) amount of necrotic tissue within the wound bed including Adherent Slough. The periwound skin appearance had no abnormalities noted for color. The periwound skin appearance exhibited: Callus, Maceration. Periwound temperature was noted as No Abnormality. 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 - Dr. [name] - Room 1 with [name] Return Appointment in 2 weeks. - Dr .[name] Room  1 Bathing/ Shower/ Hygiene: May shower and wash wound with soap and water. Negative Presssure Wound Therapy: Wound #3 Left,Lateral,Plantar Foot: Wound Vac to wound continuously at 129m/hg pressure Black Foam Edema Control - Lymphedema / SCD / Other: Elevate legs to the level of the heart or above for 30 minutes daily and/or when sitting, a frequency of: - throughout the day Avoid standing for long periods of time. Exercise regularly Compression stocking or Garment 20-30 mm/Hg pressure to: - to both legs daily Off-Loading: Open toe surgical shoe to: - Both feet Additional Orders / Instructions: Follow Nutritious Diet Non Wound Condition: Apply the following to affected area as directed: - continue to apply foam donut or cushion to healed right plantar foot WOUND #3: - Foot Wound Laterality: Plantar, Left, Lateral Cleanser: Soap and Water 3 x Per Week/30 Days Discharge Instructions: May shower and wash wound with dial antibacterial soap and water prior to dressing change. Peri-Wound Care: Skin Prep 3 x Per Week/30 Days Discharge Instructions: Use skin prep as directed Prim Dressing: Promogran Prisma Matrix, 4.34 (sq in) (silver collagen) 3 x Per Week/30 Days ary Albino, CMali([id number] Page 17 of 17 Discharge Instructions: Moisten collagen with saline or hydrogel Prim Dressing: VAC 3 x Per Week/30 Days ary Com pression Wrap: Kerlix Roll 4.5x3.1 (in/yd) (Generic) 3 x Per Week/30 Days Discharge Instructions: Apply Kerlix and Coban compression as directed. 1. No change to the wound dressing. 2. He is rigorously trying to offload this as best he can Electronic Signature(s) Signed: 03/16/2022 5:47:10 PM By: RLinton HamMD Entered By: RLinton Hamon 03/16/2022 09:21:09 -------------------------------------------------------------------------------- SuperBill Details Patient Name: Date of Service: ALewie [name] [name]  03/16/2022 Medical Record Number: [id number] Account Number: [id number] of Birth/Sex: Treating RN: 615-Sep-1987(36y.o. 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

## 2022-03-17 NOTE — Progress Notes (Signed)
[name], Max ([id number]) [id number] Page 1 of 8 Visit Report for 03/16/2022 Arrival Information Details Patient Name: Date of Service: [name] 03/16/2022 8:15 A M Medical Record Number: [id number] Patient Account Number: [id number] Date of Birth/Sex: Treating RN: 25-Feb-1986 (36 y.o. [name], Meta.Reding Primary Care Jhonatan Lomeli: [name] Other Clinician: Referring Kinney Sackmann: Treating [name]/Extender: [name] in Treatment: 7 Visit Information History Since Last Visit Added or deleted any medications: No Patient Arrived: Ambulatory Any new allergies or adverse reactions: No Arrival Time: 08:32 Had a fall or experienced change in No Accompanied By: self activities of daily living that may affect Transfer Assistance: None risk of falls: Patient Identification Verified: Yes Signs or symptoms of abuse/neglect since last visito No Secondary Verification Process Completed: Yes Hospitalized since last visit: No Patient Requires Transmission-Based Precautions: No Implantable device outside of the clinic excluding No Patient Has Alerts: No cellular tissue based products placed in the center since last visit: Has Dressing in Place as Prescribed: Yes Has Compression in Place as Prescribed: Yes Pain Present Now: No Electronic Signature(s) Signed: 03/16/2022 5:46:55 PM By: Deon Pilling RN, BSN Entered By: Deon Pilling on 03/16/2022 08:34:14 -------------------------------------------------------------------------------- Encounter Discharge Information Details Patient Name: Date of Service: [name], [name] 03/16/2022 8:15 A M Medical Record Number: [id number] Patient Account Number: [id number] Date of Birth/Sex: Treating RN: [birth year] (36 y.o. [name] Primary Care Elbridge Magowan: [name] Other Clinician: Referring [name]: Treating [name]/Extender: [name] in Treatment:  80 Encounter Discharge Information Items Discharge Condition: Stable Ambulatory Status: Ambulatory Discharge Destination: Home Transportation: Private Auto Accompanied By: self Schedule Follow-up Appointment: Yes Clinical Summary of Care: Electronic Signature(s) Signed: 03/16/2022 5:46:55 PM By: Deon Pilling RN, BSN Entered By: Deon Pilling on 03/16/2022 08:57:13 Sterling, Max ([id number]) [id number] Page 2 of 8 -------------------------------------------------------------------------------- Lower Extremity Assessment Details Patient Name: Date of Service: [name] 03/16/2022 8:15 A M Medical Record Number: [id number] Patient Account Number: [id number] Date of Birth/Sex: Treating RN: 1986-03-23 (36 y.o. [name] Primary Care Donjuan Robison: [name] Other Clinician: Referring Keashia Haskins: Treating [name]/Extender: [name] in Treatment: 124 Edema Assessment Assessed: [name]: Yes] [name]: No] Edema: [Left: Ye] [Right: s] Calf Left: Right: Point of Measurement: 48 cm From Medial Instep 46 cm Ankle Left: Right: Point of Measurement: 11 cm From Medial Instep 29 cm Vascular Assessment Pulses: Dorsalis Pedis Palpable: [Left:Yes] Electronic Signature(s) Signed: 03/16/2022 5:46:55 PM By: Deon Pilling RN, BSN Entered By: Deon Pilling on 03/16/2022 08:34:01 -------------------------------------------------------------------------------- Multi Wound Chart Details Patient Name: Date of Service: [name], [name] 03/16/2022 8:15 A M Medical Record Number: [id number] Patient Account Number: [id number] Date of Birth/Sex: Treating RN: [birth year] (36 y.o. M) Primary Care Quamesha Mullet: [name] Other Clinician: Referring Tachina Spoonemore: Treating Artavius Stearns/Extender: [name] in Treatment: 124 Vital Signs Height(in): 77 Capillary Blood Glucose(mg/dl): 101 Weight(lbs): 280 Pulse(bpm):  101 Body Mass Index(BMI): 33.2 Blood Pressure(mmHg): 135/84 Temperature(F): 98.3 Respiratory Rate(breaths/min): 20 [3:Photos:] [N/A:N/A] Left, Lateral, Plantar Foot N/A N/A Wound Location: Trauma N/A N/A Wounding Event: Diabetic Wound/Ulcer of the Lower N/A N/A Primary Etiology: Extremity Type II Diabetes N/A N/A Comorbid History: 10/02/2019 N/A N/A Date Acquired: 124 N/A N/A Weeks of Treatment: Open N/A N/A Wound Status: No N/A N/A Wound Recurrence: 3.3x3.8x1.5 N/A N/A Measurements L x W x [name] 9.849 N/A N/A A (cm) : rea 14.773 N/A N/A Volume (cm) : -497.30% N/A N/A % Reduction in A rea: -8853.30% N/A  N/A % Reduction in Volume: 9 Starting Position 1 (o'clock): 12 Ending Position 1 (o'clock): 1.2 Maximum Distance 1 (cm): Yes N/A N/A Undermining: Grade 2 N/A N/A Classification: Medium N/A N/A Exudate A mount: Serosanguineous N/A N/A Exudate Type: red, brown N/A N/A Exudate Color: Distinct, outline attached N/A N/A Wound Margin: Large (67-100%) N/A N/A Granulation A mount: Red N/A N/A Granulation Quality: Small (1-33%) N/A N/A Necrotic A mount: Fat Layer (Subcutaneous Tissue): Yes N/A N/A Exposed Structures: Fascia: No Tendon: No Muscle: No Joint: No Bone: No Small (1-33%) N/A N/A Epithelialization: Callus: Yes N/A N/A Periwound Skin Texture: Maceration: Yes N/A N/A Periwound Skin Moisture: No Abnormalities Noted N/A N/A Periwound Skin Color: No Abnormality N/A N/A Temperature: Negative Pressure Wound Therapy N/A N/A Procedures Performed: Maintenance (NPWT) Treatment Notes Wound #3 (Foot) Wound Laterality: Plantar, Left, Lateral Cleanser Soap and Water Discharge Instruction: May shower and wash wound with dial antibacterial soap and water prior to dressing change. Peri-Wound Care Skin Prep Discharge Instruction: Use skin prep as directed Topical Primary Dressing Promogran Prisma Matrix, 4.34 (sq in) (silver  collagen) Discharge Instruction: Moisten collagen with saline or hydrogel VAC Secondary Dressing Secured With Compression Wrap Kerlix Roll 4.5x3.1 (in/yd) Discharge Instruction: Apply Kerlix and Coban compression as directed. Compression Stockings Add-Ons Electronic Signature(s) Signed: 03/16/2022 5:47:10 PM By: [name] MD Entered By: [name] on 03/16/2022 09:18:55 [place], Max ([id number]) [id number] Page 4 of 8 -------------------------------------------------------------------------------- Multi-Disciplinary Care Plan Details Patient Name: Date of Service: [name] 03/16/2022 8:15 A M Medical Record Number: [id number] Patient Account Number: [id number] Date of Birth/Sex: Treating RN: 1985/09/27 (36 y.o. [name] Primary Care Damario Gillie: [name] Other Clinician: Referring Chetan Mehring: Treating Giles Currie/Extender: [name] in Treatment: [place] reviewed with physician Active Inactive Nutrition Nursing Diagnoses: Imbalanced nutrition Potential for alteratiion in Nutrition/Potential for imbalanced nutrition Goals: Patient/caregiver agrees to and verbalizes understanding of need to use nutritional supplements and/or vitamins as prescribed Date Initiated: 10/24/2019 Date Inactivated: 04/06/2020 Target Resolution Date: 04/03/2020 Goal Status: Met Patient/caregiver will maintain therapeutic glucose control Date Initiated: 10/24/2019 Target Resolution Date: 04/20/2022 Goal Status: Active Interventions: Assess HgA1c results as ordered upon admission and as needed Assess patient nutrition upon admission and as needed per policy Provide education on elevated blood sugars and impact on wound healing Provide education on nutrition Treatment Activities: Education provided on Nutrition : 12/07/2021 Notes: 11/17/20: Glucose control ongoing issue, target date extended.  01/26/21: Glucose management continues. Wound/Skin Impairment Nursing Diagnoses: Impaired tissue integrity Knowledge deficit related to ulceration/compromised skin integrity Goals: Patient/caregiver will verbalize understanding of skin care regimen Date Initiated: 10/24/2019 Target Resolution Date: 04/20/2022 Goal Status: Active Ulcer/skin breakdown will have a volume reduction of 30% by week 4 Date Initiated: 10/24/2019 Date Inactivated: 01/16/2020 Target Resolution Date: 01/10/2020 Unmet Reason: no change in Goal Status: Unmet measurements. Interventions: Assess patient/caregiver ability to obtain necessary supplies Assess patient/caregiver ability to perform ulcer/skin care regimen upon admission and as needed Assess ulceration(s) every visit Provide education on ulcer and skin care Notes: 11/17/20: Wound care regimen continues Electronic Signature(s) Signed: 03/16/2022 5:46:55 PM By: Deon Pilling RN, BSN Entered By: Deon Pilling on 03/16/2022 08:56:12 [place], Max ([id number]) [id number] Page 5 of 8 -------------------------------------------------------------------------------- Negative Pressure Wound Therapy Maintenance (NPWT) Details Patient Name: Date of Service: [name] 03/16/2022 8:15 A M Medical Record Number: [id number] Patient Account Number: [id number] Date of Birth/Sex: Treating RN: 1985/06/15 (36 y.o. [name] Primary Care Deamber Buckhalter: [name] Other  Clinician: Referring Walburga Hudman: Treating Kristien Salatino/Extender: [name] in Treatment: 124 NPWT Maintenance Performed for: Wound #3 Left, Lateral, Plantar Foot Performed By: Deon Pilling, RN Type: VAC System Coverage Size (sq cm): 12.54 Pressure Type: Constant Pressure Setting: 125 mmHG Drain Type: None Primary Contact: Other : Sponge/Dressing Type: Foam, Black Date Initiated: 02/11/2022 Dressing Removed: Yes Quantity of Sponges/Gauze  Removed: x1 black foam bridged to lateral side of foot. Canister Changed: No Canister Exudate Volume: 50 Dressing Reapplied: Yes Quantity of Sponges/Gauze Inserted: x1 black foam bridged to lateral side of foot. Respones T Treatment: o tolerated well Days On NPWT : 34 Post Procedure Diagnosis Same as Pre-procedure Electronic Signature(s) Signed: 03/16/2022 5:46:55 PM By: Deon Pilling RN, BSN Entered By: Deon Pilling on 03/16/2022 08:55:22 -------------------------------------------------------------------------------- Pain Assessment Details Patient Name: Date of Service: [name], [name] 03/16/2022 8:15 A M Medical Record Number: [id number] Patient Account Number: [id number] Date of Birth/Sex: Treating RN: [birth year] (36 y.o. [name] Primary Care Madeline Bebout: [name] Other Clinician: Referring Johnston Maddocks: Treating Uzziah Rigg/Extender: [name] in Treatment: 124 Active Problems Location of Pain Severity and Description of Pain Patient Has Paino No Site Locations Rate the pain. [place], Max ([id number]) [id number] Page 6 of 8 Rate the pain. Current Pain Level: 0 Pain Management and Medication Current Pain Management: Medication: No Cold Application: No Rest: No Massage: No Activity: No T.E.N.S.: No Heat Application: No Leg drop or elevation: No Is the Current Pain Management Adequate: Adequate How does your wound impact your activities of daily livingo Sleep: No Bathing: No Appetite: No Relationship With Others: No Bladder Continence: No Emotions: No Bowel Continence: No Work: No Toileting: No Drive: No Dressing: No Hobbies: No Engineer, maintenance) Signed: 03/16/2022 5:46:55 PM By: Deon Pilling RN, BSN Entered By: Deon Pilling on 03/16/2022 08:33:28 -------------------------------------------------------------------------------- Patient/Caregiver Education Details Patient Name: Date  of Service: [name], [name] 12/13/2023andnbsp8:15 A M Medical Record Number: [id number] Patient Account Number: [id number] Date of Birth/Gender: Treating RN: 1985-09-21 (36 y.o. [name] Primary Care Physician: [name] Other Clinician: Referring Physician: Treating Physician/Extender: [name] in Treatment: 25 Education Assessment Education Provided To: Patient Education Topics Provided Wound/Skin Impairment: Handouts: Skin Care Do's and Dont's Methods: Explain/Verbal Responses: Reinforcements needed Electronic Signature(s) Signed: 03/16/2022 5:46:55 PM By: Deon Pilling RN, BSN Entered By: Deon Pilling on 03/16/2022 08:56:28 [place], Max ([id number]) [id number] Page 7 of 8 -------------------------------------------------------------------------------- Wound Assessment Details Patient Name: Date of Service: [name] 03/16/2022 8:15 A M Medical Record Number: [id number] Patient Account Number: [id number] Date of Birth/Sex: Treating RN: [birth year] (36 y.o. [name] Primary Care Coral Soler: [name] Other Clinician: Referring Destin Kittler: Treating Laterrance Nauta/Extender: [name] in Treatment: 124 Wound Status Wound Number: 3 Primary Etiology: Diabetic Wound/Ulcer of the Lower Extremity Wound Location: Left, Lateral, Plantar Foot Wound Status: Open Wounding Event: Trauma Comorbid History: Type II Diabetes Date Acquired: 10/02/2019 Weeks Of Treatment: 124 Clustered Wound: No Photos Wound Measurements Length: (cm) 3.3 Width: (cm) 3.8 Depth: (cm) 1.5 Area: (cm) 9.849 Volume: (cm) 14.773 % Reduction in Area: -497.3% % Reduction in Volume: -8853.3% Epithelialization: Small (1-33%) Tunneling: No Undermining: Yes Starting Position (o'clock): 9 Ending Position (o'clock): 12 Maximum Distance: (cm) 1.2 Wound Description Classification: Grade 2 Wound  Margin: Distinct, outline attached Exudate Amount: Medium Exudate Type: Serosanguineous Exudate Color: red, brown Foul Odor After Cleansing: No Slough/Fibrino No Wound Bed Granulation Amount: Large (67-100%) Exposed Structure Granulation Quality: Red Fascia  Exposed: No Necrotic Amount: Small (1-33%) Fat Layer (Subcutaneous Tissue) Exposed: Yes Necrotic Quality: Adherent Slough Tendon Exposed: No Muscle Exposed: No Joint Exposed: No Bone Exposed: No Periwound Skin Texture Texture Color No Abnormalities Noted: No No Abnormalities Noted: Yes Callus: Yes Temperature / Pain Temperature: No Abnormality Moisture No Abnormalities Noted: No MacerationPorfirio Bollier, Max ([id number]) [id number] Page 8 of 8 Treatment Notes Wound #3 (Foot) Wound Laterality: Plantar, Left, Lateral Cleanser Soap and Water Discharge Instruction: May shower and wash wound with dial antibacterial soap and water prior to dressing change. Peri-Wound Care Skin Prep Discharge Instruction: Use skin prep as directed Topical Primary Dressing Promogran Prisma Matrix, 4.34 (sq in) (silver collagen) Discharge Instruction: Moisten collagen with saline or hydrogel VAC Secondary Dressing Secured With Compression Wrap Kerlix Roll 4.5x3.1 (in/yd) Discharge Instruction: Apply Kerlix and Coban compression as directed. Compression Stockings Add-Ons Electronic Signature(s) Signed: 03/16/2022 5:46:55 PM By: Deon Pilling RN, BSN Entered By: Deon Pilling on 03/16/2022 08:42:38 -------------------------------------------------------------------------------- Vitals Details Patient Name: Date of Service: [name], [name] 03/16/2022 8:15 A M Medical Record Number: [id number] Patient Account Number: [id number] Date of Birth/Sex: Treating RN: 1985/06/11 (36 y.o. [name] Primary Care Tellis Spivak: [name] Other Clinician: Referring Davisha Linthicum: Treating Aliahna Statzer/Extender: [name] in Treatment: 124 Vital Signs Time Taken: 08:30 Temperature (F): 98.3 Height (in): 77 Pulse (bpm): 101 Weight (lbs): 280 Respiratory Rate (breaths/min): 20 Body Mass Index (BMI): 33.2 Blood Pressure (mmHg): 135/84 Capillary Blood Glucose (mg/dl): 101 Reference Range: 80 - 120 mg / dl Electronic Signature(s) Signed: 03/16/2022 5:46:55 PM By: Deon Pilling RN, BSN Entered By: Deon Pilling on 03/16/2022 08:33:19

## 2022-03-23 ENCOUNTER — Encounter (HOSPITAL_BASED_OUTPATIENT_CLINIC_OR_DEPARTMENT_OTHER): Payer: BC Managed Care – PPO | Admitting: General Surgery

## 2022-03-23 DIAGNOSIS — E11621 Type 2 diabetes mellitus with foot ulcer: Secondary | ICD-10-CM | POA: Diagnosis not present

## 2022-03-24 NOTE — Progress Notes (Signed)
[name] ([id number]) [id number] Page 1 of 8 Visit Report for 03/23/2022 Arrival Information Details Patient Name: Date of Service: [name] 03/23/2022 8:15 A M Medical Record Number: [id number] Patient Account Number: [id number] Date of Birth/Sex: Treating RN: 08/30/1985 (36 y.o. [name] Primary Care Dodie Parisi: [name] Other Clinician: Referring Laiza Veenstra: Treating Edia Pursifull/Extender: [name] in Treatment: 71 Visit Information History Since Last Visit Added or deleted any medications: No Patient Arrived: Ambulatory Any new allergies or adverse reactions: No Arrival Time: 08:35 Had a fall or experienced change in No Accompanied By: self activities of daily living that may affect Transfer Assistance: None risk of falls: Patient Requires Transmission-Based Precautions: No Signs or symptoms of abuse/neglect since last visito No Patient Has Alerts: No Hospitalized since last visit: No Implantable device outside of the clinic excluding No cellular tissue based products placed in the center since last visit: Has Dressing in Place as Prescribed: Yes Pain Present Now: No Electronic Signature(s) Signed: 03/24/2022 4:49:39 PM By: Blanche East RN Entered By: Blanche East on 03/23/2022 08:37:41 -------------------------------------------------------------------------------- Encounter Discharge Information Details Patient Name: Date of Service: [name], [name] 03/23/2022 8:15 A M Medical Record Number: [id number] Patient Account Number: [id number] Date of Birth/Sex: Treating RN: 1985/09/28 (36 y.o. [name] Primary Care Kyvon Hu: [name] Other Clinician: Referring Dayanara Sherrill: Treating Kadan Millstein/Extender: [name] in Treatment: 125 Encounter Discharge Information Items Post Procedure Vitals Discharge Condition: Stable Temperature (F): 98.5 Ambulatory  Status: Ambulatory Pulse (bpm): 89 Discharge Destination: Home Respiratory Rate (breaths/min): 16 Transportation: Private Auto Blood Pressure (mmHg): 137/87 Accompanied By: self Schedule Follow-up Appointment: Yes Clinical Summary of Care: Electronic Signature(s) Signed: 03/23/2022 9:14:04 AM By: Blanche East RN Entered By: Blanche East on 03/23/2022 09:14:03 [place], [name] ([id number]) [id number] Page 2 of 8 -------------------------------------------------------------------------------- Lower Extremity Assessment Details Patient Name: Date of Service: [name] 03/23/2022 8:15 A M Medical Record Number: [id number] Patient Account Number: [id number] Date of Birth/Sex: Treating RN: 1986/03/04 (35 y.o. [name] Primary Care Jessejames Steelman: [name] Other Clinician: Referring Aubreyanna Dorrough: Treating Shaniquia Brafford/Extender: [name] in Treatment: 125 Edema Assessment Assessed: [name]: No] [name]: No] Edema: [Left: Ye] [Right: s] Calf Left: Right: Point of Measurement: 48 cm From Medial Instep 49 cm Ankle Left: Right: Point of Measurement: 11 cm From Medial Instep 31 cm Vascular Assessment Pulses: Dorsalis Pedis Palpable: [Left:Yes] Electronic Signature(s) Signed: 03/24/2022 4:49:39 PM By: Blanche East RN Entered By: Blanche East on 03/23/2022 08:38:54 -------------------------------------------------------------------------------- Multi Wound Chart Details Patient Name: Date of Service: [name], [name] 03/23/2022 8:15 A M Medical Record Number: [id number] Patient Account Number: [id number] Date of Birth/Sex: Treating RN: [birth year] (36 y.o. M) Primary Care Delano Frate: [name] Other Clinician: Referring Akira Adelsberger: Treating Aura Bibby/Extender: [name] in Treatment: 125 Vital Signs Height(in): 77 Capillary Blood Glucose(mg/dl): 89 Weight(lbs): 280 Pulse(bpm): 89 Body  Mass Index(BMI): 33.2 Blood Pressure(mmHg): 137/87 Temperature(F): 98.5 Respiratory Rate(breaths/min): 16 [3:Photos:] [N/A:N/A] Left, Lateral, Plantar Foot N/A N/A Wound Location: Trauma N/A N/A Wounding Event: Diabetic Wound/Ulcer of the Lower N/A N/A Primary Etiology: Extremity Type II Diabetes N/A N/A Comorbid History: 10/02/2019 N/A N/A Date Acquired: 125 N/A N/A Weeks of Treatment: Open N/A N/A Wound Status: No N/A N/A Wound Recurrence: 3x3.5x0.8 N/A N/A Measurements L x W x [name] 8.247 N/A N/A A (cm) : rea 6.597 N/A N/A Volume (cm) : -400.10% N/A N/A % Reduction in A rea: -3898.20% N/A N/A %  Reduction in Volume: 12 Starting Position 1 (o'clock): 6 Ending Position 1 (o'clock): 1.3 Maximum Distance 1 (cm): Yes N/A N/A Undermining: Grade 2 N/A N/A Classification: Medium N/A N/A Exudate A mount: Serosanguineous N/A N/A Exudate Type: red, brown N/A N/A Exudate Color: Distinct, outline attached N/A N/A Wound Margin: Large (67-100%) N/A N/A Granulation A mount: Red N/A N/A Granulation Quality: Small (1-33%) N/A N/A Necrotic A mount: Fat Layer (Subcutaneous Tissue): Yes N/A N/A Exposed Structures: Fascia: No Tendon: No Muscle: No Joint: No Bone: No Small (1-33%) N/A N/A Epithelialization: Debridement - Selective/Open Wound N/A N/A Debridement: Pre-procedure Verification/Time Out 08:50 N/A N/A Taken: Lidocaine 5% topical ointment N/A N/A Pain Control: Callus, Slough N/A N/A Tissue Debrided: Skin/Epidermis N/A N/A Level: 10.5 N/A N/A Debridement A (sq cm): rea Curette N/A N/A Instrument: Minimum N/A N/A Bleeding: Pressure N/A N/A Hemostasis A chieved: Procedure was tolerated well N/A N/A Debridement Treatment Response: 3x3.5x0.8 N/A N/A Post Debridement Measurements L x W x [name] 6.597 N/A N/A Post Debridement Volume: (cm) Callus: Yes N/A N/A Periwound Skin Texture: Maceration: Yes N/A N/A Periwound Skin Moisture: No  Abnormalities Noted N/A N/A Periwound Skin Color: No Abnormality N/A N/A Temperature: Debridement N/A N/A Procedures Performed: Negative Pressure Wound Therapy Maintenance (NPWT) Treatment Notes Electronic Signature(s) Signed: 03/23/2022 8:54:24 AM By: [name] MD FACS Entered By: [name] on 03/23/2022 08:54:24 -------------------------------------------------------------------------------- Multi-Disciplinary Care Plan Details Patient Name: Date of Service: [name], [name] 03/23/2022 8:15 A M Medical Record Number: [id number] Patient Account Number: [id number] Date of Birth/Sex: Treating RN: May 08, 1985 (36 y.o. [name] Primary Care Simmone Cape: [name] Other Clinician: Referring Aristeo Hankerson: Treating Emilianna Barlowe/Extender: [name] in Treatment: [place] reviewed with physician [place], [name] ([id number]) [id number] Page 4 of 8 Active Inactive Nutrition Nursing Diagnoses: Imbalanced nutrition Potential for alteratiion in Nutrition/Potential for imbalanced nutrition Goals: Patient/caregiver agrees to and verbalizes understanding of need to use nutritional supplements and/or vitamins as prescribed Date Initiated: 10/24/2019 Date Inactivated: 04/06/2020 Target Resolution Date: 04/03/2020 Goal Status: Met Patient/caregiver will maintain therapeutic glucose control Date Initiated: 10/24/2019 Target Resolution Date: 04/20/2022 Goal Status: Active Interventions: Assess HgA1c results as ordered upon admission and as needed Assess patient nutrition upon admission and as needed per policy Provide education on elevated blood sugars and impact on wound healing Provide education on nutrition Treatment Activities: Education provided on Nutrition : 02/02/2022 Notes: 11/17/20: Glucose control ongoing issue, target date extended. 01/26/21: Glucose management continues. Wound/Skin  Impairment Nursing Diagnoses: Impaired tissue integrity Knowledge deficit related to ulceration/compromised skin integrity Goals: Patient/caregiver will verbalize understanding of skin care regimen Date Initiated: 10/24/2019 Target Resolution Date: 04/20/2022 Goal Status: Active Ulcer/skin breakdown will have a volume reduction of 30% by week 4 Date Initiated: 10/24/2019 Date Inactivated: 01/16/2020 Target Resolution Date: 01/10/2020 Unmet Reason: no change in Goal Status: Unmet measurements. Interventions: Assess patient/caregiver ability to obtain necessary supplies Assess patient/caregiver ability to perform ulcer/skin care regimen upon admission and as needed Assess ulceration(s) every visit Provide education on ulcer and skin care Notes: 11/17/20: Wound care regimen continues Electronic Signature(s) Signed: 03/24/2022 4:49:39 PM By: Blanche East RN Entered By: Blanche East on 03/23/2022 08:50:15 -------------------------------------------------------------------------------- Negative Pressure Wound Therapy Maintenance (NPWT) Details Patient Name: Date of Service: [name] 03/23/2022 8:15 A M Medical Record Number: [id number] Patient Account Number: [id number] Date of Birth/Sex: Treating RN: Jan 02, 1986 (35 y.o. [name] Primary Care Amay Mijangos: [name] Other Clinician: Referring Ishia Tenorio: Treating Essica Kiker/Extender: Wilfrid Lund  Weeks in Treatment: 125 NPWT Maintenance Performed for: Wound #3 Left, Lateral, Plantar Foot Performed By: Blanche East, RN Type: [place], [name] ([id number]) [id number] Page 5 of 8 Coverage Size (sq cm): 10.5 Pressure Type: Constant Pressure Setting: 125 mmHG Drain Type: None Primary Contact: Other : Prisma Sponge/Dressing Type: Foam, Black Date Initiated: 02/11/2022 Dressing Removed: No Quantity of Sponges/Gauze Removed: 1 Canister Changed: No Canister Exudate  Volume: 20 Dressing Reapplied: No Quantity of Sponges/Gauze Inserted: 1 Respones T Treatment: o tolerated well Days On NPWT : 41 Post Procedure Diagnosis Same as Pre-procedure Electronic Signature(s) Signed: 03/24/2022 4:49:39 PM By: Blanche East RN Entered By: Blanche East on 03/23/2022 08:53:41 -------------------------------------------------------------------------------- Pain Assessment Details Patient Name: Date of Service: [name] 03/23/2022 8:15 A M Medical Record Number: [id number] Patient Account Number: [id number] Date of Birth/Sex: Treating RN: 04-17-1985 (36 y.o. [name] Primary Care Marieme Mcmackin: [name] Other Clinician: Referring Andie Mortimer: Treating Salim Forero/Extender: [name] in Treatment: 125 Active Problems Location of Pain Severity and Description of Pain Patient Has Paino No Site Locations Rate the pain. Current Pain Level: 0 Pain Management and Medication Current Pain Management: Electronic Signature(s) Signed: 03/24/2022 4:49:39 PM By: Blanche East RN Entered By: Blanche East on 03/23/2022 08:38:12 [place], [name] ([id number]) [id number] Page 6 of 8 -------------------------------------------------------------------------------- Patient/Caregiver Education Details Patient Name: Date of Service: [name] 12/20/2023andnbsp8:15 A M Medical Record Number: [id number] Patient Account Number: [id number] Date of Birth/Gender: Treating RN: 20-Jun-1985 (36 y.o. [name] Primary Care Physician: [name] Other Clinician: Referring Physician: Treating Physician/Extender: [name] in Treatment: 125 Education Assessment Education Provided To: Patient Education Topics Provided Wound/Skin Impairment: Methods: Explain/Verbal Responses: Reinforcements needed, State content correctly [employer]) Signed: 03/24/2022 4:49:39  PM By: Blanche East RN Entered By: Blanche East on 03/23/2022 08:50:55 -------------------------------------------------------------------------------- Wound Assessment Details Patient Name: Date of Service: [name] 03/23/2022 8:15 A M Medical Record Number: [id number] Patient Account Number: [id number] Date of Birth/Sex: Treating RN: September 19, 1985 (36 y.o. [name] Primary Care Othel Dicostanzo: [name] Other Clinician: Referring Burnette Sautter: Treating Hallie Ertl/Extender: [name] in Treatment: 125 Wound Status Wound Number: 3 Primary Etiology: Diabetic Wound/Ulcer of the Lower Extremity Wound Location: Left, Lateral, Plantar Foot Wound Status: Open Wounding Event: Trauma Comorbid History: Type II Diabetes Date Acquired: 10/02/2019 Weeks Of Treatment: 125 Clustered Wound: No Photos Wound Measurements Length: (cm) Width: (cm) [place], [name] ([id number]) Depth: (cm) 0 Area: (cm) Volume: (cm) 3 % Reduction in Area: -400.1% 3.5 % Reduction in Volume: -3898.2% [id number] Page 7 of 8 .8 Epithelialization: Small (1-33%) 8.247 Tunneling: No 6.597 Undermining: Yes Starting Position (o'clock): 12 Ending Position (o'clock): 6 Maximum Distance: (cm) 1.3 Wound Description Classification: Grade 2 Wound Margin: Distinct, outline attached Exudate Amount: Medium Exudate Type: Serosanguineous Exudate Color: red, brown Foul Odor After Cleansing: No Slough/Fibrino No Wound Bed Granulation Amount: Large (67-100%) Exposed Structure Granulation Quality: Red Fascia Exposed: No Necrotic Amount: Small (1-33%) Fat Layer (Subcutaneous Tissue) Exposed: Yes Necrotic Quality: Adherent Slough Tendon Exposed: No Muscle Exposed: No Joint Exposed: No Bone Exposed: No Periwound Skin Texture Texture Color No Abnormalities Noted: No No Abnormalities Noted: Yes Callus: Yes Temperature / Pain Temperature: No  Abnormality Moisture No Abnormalities Noted: No Maceration: Yes Treatment Notes Wound #3 (Foot) Wound Laterality: Plantar, Left, Lateral Cleanser Soap and Water Discharge Instruction: May shower and wash wound with dial antibacterial soap and water prior to dressing change. Peri-Wound Care Skin  Prep Discharge Instruction: Use skin prep as directed Topical Primary Dressing Promogran Prisma Matrix, 4.34 (sq in) (silver collagen) Discharge Instruction: Moisten collagen with saline or hydrogel VAC Secondary Dressing Secured With Compression Wrap Kerlix Roll 4.5x3.1 (in/yd) Discharge Instruction: Apply Kerlix and Coban compression as directed. Compression Stockings Add-Ons Electronic Signature(s) Signed: 03/24/2022 4:49:39 PM By: Blanche East RN Entered By: Blanche East on 03/23/2022 08:44:28 [place], [name] ([id number]) [id number] Page 8 of 8 -------------------------------------------------------------------------------- Vitals Details Patient Name: Date of Service: [name] 03/23/2022 8:15 A M Medical Record Number: [id number] Patient Account Number: [id number] Date of Birth/Sex: Treating RN: 1985/05/25 (36 y.o. [name] Primary Care Irasema Chalk: [name] Other Clinician: Referring Darra Rosa: Treating Lenn Volker/Extender: [name] in Treatment: 125 Vital Signs Time Taken: 08:37 Temperature (F): 98.5 Height (in): 77 Pulse (bpm): 89 Weight (lbs): 280 Respiratory Rate (breaths/min): 16 Body Mass Index (BMI): 33.2 Blood Pressure (mmHg): 137/87 Capillary Blood Glucose (mg/dl): 89 Reference Range: 80 - 120 mg / dl Electronic Signature(s) Signed: 03/24/2022 4:49:39 PM By: Blanche East RN Entered By: Blanche East on 03/23/2022 08:38:05

## 2022-03-24 NOTE — Progress Notes (Signed)
[name], [name] ([id number]) [id number] Page 1 of 21 Visit Report for 03/23/2022 Chief Complaint Document Details Patient Name: Date of Service: [name] D 03/23/2022 8:15 A M Medical Record Number: [id number] Patient Account Number: [id number] Date of Birth/Sex: Treating RN: 04-15-1985 (36 y.o. M) Primary Care Provider: [name] Other Clinician: Referring Provider: Treating Provider/Extender: [name] in Treatment: 125 Information Obtained from: Patient Chief Complaint 01/11/2019; patient is here for review of a rather substantial wound over the left fifth plantar metatarsal head extending into the lateral part of his foot 10/24/2019; patient returns to clinic with wounds on his bilateral feet with underlying osteomyelitis biopsy-proven Electronic Signature(s) Signed: 03/23/2022 8:54:32 AM By: Max Maudlin MD FACS Entered By: [name] on 03/23/2022 08:54:32 -------------------------------------------------------------------------------- Debridement Details Patient Name: Date of Service: [name], [name] 03/23/2022 8:15 A M Medical Record Number: [id number] Patient Account Number: [id number] Date of Birth/Sex: Treating RN: 1985/09/27 (36 y.o. [name] Primary Care Provider: [name] Other Clinician: Referring Provider: Treating Provider/Extender: [name] in Treatment: 125 Debridement Performed for Assessment: Wound #3 Left,Lateral,Plantar Foot Performed By: Physician Max Maudlin, MD Debridement Type: Debridement Severity of Tissue Pre Debridement: Fat layer exposed Level of Consciousness (Pre-procedure): Awake and Alert Pre-procedure Verification/Time Out Yes - 08:50 Taken: Start Time: 08:51 Pain Control: Lidocaine 5% topical ointment T Area Debrided (L x W): otal 3 (cm) x 3.5 (cm) = 10.5 (cm) Tissue and other material debrided: Non-Viable,  Callus, Slough, Skin: Epidermis, Slough Level: Skin/Epidermis Debridement Description: Selective/Open Wound Instrument: Curette Bleeding: Minimum Hemostasis Achieved: Pressure Response to Treatment: Procedure was tolerated well Level of Consciousness (Post- Awake and Alert procedure): Post Debridement Measurements of Total Wound Length: (cm) 3 Width: (cm) 3.5 Depth: (cm) 0.8 Volume: (cm) 6.597 Character of Wound/Ulcer Post Debridement: Requires Further Debridement Severity of Tissue Post Debridement: Fat layer exposed Post Procedure Diagnosis [name], [name] ([id number]) [id number] Page 2 of 21 Same as Pre-procedure Notes Scribed for Dr. Celine [name] by Max East, RN Electronic Signature(s) Signed: 03/23/2022 9:12:08 AM By: Max Maudlin MD FACS Signed: 03/24/2022 4:49:39 PM By: Max East RN Entered By: [name] on 03/23/2022 08:52:46 -------------------------------------------------------------------------------- HPI Details Patient Name: Date of Service: [name], [name] 03/23/2022 8:15 A M Medical Record Number: [id number] Patient Account Number: [id number] Date of Birth/Sex: Treating RN: 1985/10/31 (36 y.o. 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 He comes in for review of a wound over the plantar fifth metatarsal head extending into the lateral part of the foot. He was followed for this previously by his podiatrist Dr. Cornelius [name]. As the patient tells his story he went to see podiatry first for a swelling he developed on the lateral part of his fifth metatarsal head in May. He states this was "open" by podiatry and the area closed. He was followed up in June and it was  again opened callus removed and it closed promptly. There were plans being made for surgery on the fifth metatarsal head in June however his blood sugar was apparently too high for anesthesia. Apparently the area was debrided and opened again in June and it is never closed since. Looking over the records from podiatry I am really not able to follow this. It was clear when he was first seen  it was before 5/14 at that point he already had a wound. By 5/17 the ulcer was resolved. I do not see anything about a procedure. On 5/28 noted to have pre-ulcerative moderate keratosis. X-ray noted 1/5 contracted toe and tailor's bunion and metatarsal deformity. On a visit date on 09/28/2018 the dorsal part of the left foot it healed and resolved. There was concern about swelling in his lower extremity he was sent to the ER.. As far as I can tell he was seen in the ER on 7/12 with an ulcer on his left foot. A DVT rule out of the left leg was negative. I do not think I have complete records from podiatry but I am not able to verify the procedures this patient states he had. He states after the last procedure the wound has never closed although I am not able to follow this in the records I have from podiatry. He has not had a recent x-ray The patient has been using Neosporin on the wound. He is wearing a Darco shoe. He is still very active up on his foot working and exercising. Past medical history; type 2 diabetes ketosis-prone, leg swelling with a negative DVT study in July. Non-smoker ABI in our clinic was 0.85 on the left 10/16; substantial wound on the plantar left fifth met head extending laterally almost to the dorsal fifth MTP. We have been using silver alginate we gave him a Darco forefoot off loader. An x-ray did not show evidence of osteomyelitis did note soft tissue emphysema which I think was due to gas tracking through an open wound. There is no doubt in my mind he requires an MRI 10/23; MRI not booked  until 3 November at the earliest this is largely due to his glucose sensor in the right arm. We have been using silver alginate. There has been an improvement 10/29; I am still not exactly sure when his MRI is booked for. He says it is the third but it is the 10th in epic. This definitely needs to be done. He is running a low-grade fever today but no other symptoms. No real improvement in the 1 02/26/2019 patient presents today for a follow-up visit here in our clinic he is last been seen in the clinic on October 29. Subsequently we were working on getting MRI to evaluate and see what exactly was going on and where we would need to go from the standpoint of whether or not he had osteomyelitis and again what treatments were going be required. Subsequently the patient ended up being admitted to the hospital on 02/07/2019 and was discharged on 02/14/2019. This is a somewhat interesting admission with a discharge diagnosis of pneumonia due to COVID-19 although he was positive for COVID-19 when tested at the urgent care but negative x2 when he was actually in the hospital. With that being said he did have acute respiratory failure with hypoxia and it was noted he also have a left foot ulceration with osteomyelitis. With that being said he did require oxygen for his pneumonia and I level 4 L. He was placed on antivirals and steroids for the COVID-19. He was also transferred to the [facility] at one point. Nonetheless he did subsequently discharged home and since being home has done much better in that regard. The CT angiogram did not show any pulmonary embolism. With regard to the osteomyelitis the patient was placed on vancomycin and Zosyn while in the hospital but has been changed to Augmentin at  discharge. It was also recommended that he follow- up with wound care and podiatry. Podiatry however wanted him to see [name] according to the patient prior to them doing anything further. His hemoglobin A1c  was 9.9 as noted in the hospital. Have an MRI of the left foot performed while in the hospital on 02/04/2019. 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 There is an overlying plantar open wound noted an abscess tracking back along the lateral aspect of the fifth metatarsal shaft. There is otherwise diffuse cellulitis and mild fasciitis without findings of polymyositis. The patient did have recently pneumonia secondary to COVID-19 I looked in the chart through epic and it does appear that the patient may need to have an additional x-ray just to ensure everything is cleared and that he has no airspace disease prior to putting him into the [name]. 03/05/2019; patient was readmitted to the clinic last week. He was hospitalized twice for a viral upper respiratory tract infection from 11/1 through 11/4 and then 11/5 through 11/12 ultimately this turned out to be Covid pneumonitis. Although he was discharged on oxygen he is not using it. He says he feels fine. He has no exercise limitation no cough no sputum. His O2 sat in our clinic today was 100% on room air. 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 He received Vanco and Zosyn in the hospital and then was discharged on 2 weeks of Augmentin. I do not see any relevant cultures. He was supposed to follow-up with infectious disease but I do not see that he has an appointment. 12/8; patient saw Dr. Novella Olive of infectious disease last week. He felt that he had had adequate antibiotic therapy. He did not go to follow-up with Dr. Amalia Hailey of [place], [name] ([id number]) [id number] Page 3 of 21 podiatry and I have again talked to him about the pros and cons of this. He does not want to consider a ray amputation of this time. He is  aware of the risks of recurrence, migration etc. He started HBO today and tolerated this well. He can complete the Augmentin that I gave him last week. I have looked over the lab work that Dr. Chana Bode ordered his C-reactive protein was 3.3 and his sedimentation rate was 17. The C-reactive protein is never really been measurably that high in this patient 12/15; not much change in the wound today however he has undermining along the lateral part of the foot again more extensively than last week. He has some rims of epithelialization. We have been using silver alginate. He is undergoing hyperbarics but did not dive today 12/18; in for his obligatory first total contact cast change. Unfortunately there was pus coming from the undermining area around his fifth metatarsal head. This was cultured but will preclude reapplication of a cast. He is seen in conjunction with HBO 12/24; patient had staph lugdunensis in the wound in the undermining area laterally last time. We put him on doxycycline which should have covered this. The wound looks better today. I am going to give him another week of doxycycline before reattempting the total contact cast 12/31; the patient is completing antibiotics. Hemorrhagic debris in the distal part of the wound with some undermining distally. He also had hyper granulation. Extensive debridement with a #5 curette. The infected area that was on  the lateral part of the fifth met head is closed over. I do not think he needs any more antibiotics. Patient was seen prior to HBO. Preparations for a total contact cast were made in the cast will be placed post hyperbarics 04/11/19; once again the patient arrives today without complaint. He had been in a cast all week noted that he had heavy drainage this week. This resulted in large raised areas of macerated tissue around the wound 1/14; wound bed looks better slightly smaller. Hydrofera Blue has been changing himself. He had a heavy  drainage last week which caused a lot of maceration around the wound so I took him out of a total contact cast he says the drainage is actually better this week He is seen today in conjunction with HBO 1/21; returns to clinic. He was up in [place] for a day or 2 attending a funeral. He comes back in with the wound larger and with a large area of exposed bone. He had osteomyelitis and septic arthritis of the fifth left metatarsal head while he was in hospital. He received IV antibiotics in the hospital for a prolonged period of time then 3 weeks of Augmentin. Subsequently I gave him 2 weeks of doxycycline for more superficial wound infection. When I saw this last week the wound was smaller the surface of the wound looks satisfactory. 1/28; patient missed hyperbarics today. Bone biopsy I did last time showed Enterococcus faecalis and Staphylococcus lugdunensis . He has a wide area of exposed bone. We are going to use silver alginate as of today. I had another ethical discussion with the patient. 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 In this situation I think the likelihood of healing this is low. Therefore I have recommended a ray amputation and with the patient's agreement I have referred him to Dr. Doran Durand. The other issue is that his compliance with hyperbarics has been minimal because of his work schedule and given his underlying decision I am going to stop this today READMISSION 10/24/2019 MRI 09/29/2019 left foot IMPRESSION: 1. Apparent skin ulceration inferior and lateral to the 5th metatarsal base with underlying heterogeneous T2 signal and enhancement in the subcutaneous fat. Small peripherally enhancing fluid collections along the plantar and lateral aspects of the 5th metatarsal base suspicious for abscesses. 2. Interval amputation through the mid 5th metatarsal with nonspecific low-level marrow edema and enhancement. Given the proximity to the  adjacent soft tissue inflammatory changes, osteomyelitis cannot be excluded. 3. The additional bones appear unremarkable. MRI 09/29/2019 right foot IMPRESSION: 1. Soft tissue ulceration lateral to the 5th MTP joint. There is low-level T2 hyperintensity within the 4th and 5th metatarsal heads and adjacent proximal phalanges without abnormal T1 signal or cortical destruction. These findings are nonspecific and could be seen with early marrow edema, hyperemia or early osteomyelitis. No evidence of septic joint. 2. Mild tenosynovitis and synovial enhancement associated with the extensor digitorum tendons at the level of the midfoot. 3. Diffuse low-level muscular T2 hyperintensity and enhancement, most consistent with diabetic myopathy. LEFT FOOT BONE Methicillin resistant staphylococcus aureus Staphylococcus lugdunensis MIC MIC CIPROFLOXACIN >=8 RESISTANT Resistant <=0.5 SENSI... Sensitive CLINDAMYCIN <=0.25 SENS... Sensitive >=8 RESISTANT Resistant ERYTHROMYCIN >=8 RESISTANT Resistant >=8 RESISTANT Resistant GENTAMICIN <=0.5 SENSI... Sensitive <=0.5 SENSI... Sensitive Inducible Clindamycin NEGATIVE Sensitive NEGATIVE Sensitive OXACILLIN >=4 RESISTANT Resistant 2 SENSITIVE Sensitive RIFAMPIN <=0.5 SENSI... Sensitive <=0.5 SENSI... Sensitive TETRACYCLINE <=1 SENSITIVE Sensitive <=1 SENSITIVE Sensitive TRIMETH/SULFA <=10 SENSIT Sensitive <=10 SENSIT Sensitive ... Marland Kitchen.. VANCOMYCIN  1 SENSITIVE Sensitive <=0.5 SENSI... Sensitive Right foot bone . Component 3 wk ago Specimen Description BONE Special Requests RIGHT 4 METATARSAL SAMPLE B Gram Stain NO WBC SEEN NO ORGANISMS SEEN Culture RARE METHICILLIN RESISTANT STAPHYLOCOCCUS AUREUS NO ANAEROBES ISOLATED Performed at [facility], [place] [address], [place], [place] [address] Report Status 10/08/2019 FINAL Organism ID, Bacteria METHICILLIN RESISTANT STAPHYLOCOCCUS AUREUS Resulting Agency [facility] Susceptibility Zachow, [name]  ([id number]) [id number] Page 4 of 21 Methicillin resistant staphylococcus aureus MIC CIPROFLOXACIN >=8 RESISTANT Resistant CLINDAMYCIN <=0.25 SENS... Sensitive ERYTHROMYCIN >=8 RESISTANT Resistant GENTAMICIN <=0.5 SENSI... Sensitive Inducible Clindamycin NEGATIVE Sensitive OXACILLIN >=4 RESISTANT Resistant RIFAMPIN <=0.5 SENSI... Sensitive TETRACYCLINE <=1 SENSITIVE Sensitive TRIMETH/SULFA <=10 SENSIT Sensitive ... VANCOMYCIN 1 SENSITIVE Sensitive This is a patient we had in clinic earlier this year with a wound over his left fifth metatarsal head. 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 Doran Durand on 05/16/2019. He seems to have developed problems with wounds on his bilateral feet in June 2021 although he may have had problems earlier than this. He was in an urgent care with a right foot ulcer on 09/26/2019 and given a course of doxycycline. This was apparently after having trouble getting into see orthopedics. He was seen by podiatry on 09/28/2019 noted to have bilateral lower extremity ulcers including the left lateral fifth metatarsal base and the right subfifth met head. It was noted that had purulent drainage at that time. He required hospitalization from 6/20 through 7/2. This was because of worsening right foot wounds. He underwent bilateral operative incision and drainage and bone biopsies bilaterally. Culture results are listed above. He has been referred back to clinic by Dr. Jacqualyn Posey of podiatry. He is also followed by Dr. Megan Salon who saw him yesterday. He was discharged from hospital on Zyvox Flagyl and Levaquin and yesterday changed to doxycycline Flagyl and Levaquin. His  inflammatory markers on 6/26 showed a sedimentation rate of 129 and a C-reactive protein of 5. This is improved to 14 and 1.3 respectively. This would indicate improvement. ABIs in our clinic today were 1.23 on the right and 1.20 on the left 11/01/2019 on evaluation today patient appears to be doing fairly well in regard to the wounds on his feet at this point. Fortunately there is no signs of active infection at this time. No fevers, chills, nausea, vomiting, or diarrhea. He currently is seeing infectious disease and still under their care at this point. Subsequently he also has both wounds which she has not been using collagen on as he did not receive that in his packaging he did not call us and let us know that. Apparently that just was missed on the order. Nonetheless we will get that straightened out today. 8/9-Patient returns for bilateral foot wounds, using Prisma with hydrogel moistened dressings, and the wounds appear stable. Patient using surgical shoes, avoiding much pressure or weightbearing as much as possible 8/16; patient has bilateral foot wounds. 1 on the right lateral foot proximally the other is on the left mid lateral foot. Both required debridement of callus and thick skin around the wounds. We have been using silver collagen 8/27; patient has bilateral lateral foot wounds. The area on the left substantially surrounded by callus and dry skin. This was  removed from the wound edge. The underlying wound is small. The area on the right measured somewhat smaller today. 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 Unfortunately I did not update his antibiotics during today's visit. 9/10 I reviewed Dr. Hale Bogus last notes he felt he had completed antibiotics his inflammatory markers were reasonably well controlled. He has a small wound on the lateral left foot and a tiny area on the right which is just above closed. He is  using Hydrofera Blue with border foam he has bilateral surgical shoes 9/24; 2 week f/u. doing well. right foot is closed. left foot still undermined. 10/14; right foot remains closed at the fifth met head. The area over the base of the left fifth metatarsal has a small open area but considerable undermining towards the plantar foot. Thick callus skin around this suggests an adequate pressure relief. We have talked about this. He says he is going to go back into his cam boot. I suggested a total contact cast he did not seem enamored with this suggestion 10/26; left foot base of the fifth metatarsal. Same condition as last time. He has skin over the area with an open wound however the skin is not adherent. He went to see Dr. Earleen Newport who did an x-ray and culture of his foot I have not reviewed the x-ray but the patient was not told anything. He is on doxycycline 11/11; since the patient was last here he was in the emergency room on 10/30 he was concerned about swelling in the left foot. They did not do any cultures or x-rays. They changed his antibiotics to cephalexin. Previous culture showed group B strep. The cephalexin is appropriate as doxycycline has less than predictable coverage. Arrives in clinic today with swelling over this area under the wound. He also has a new wound on the right fifth metatarsal head 11/18; the patient has a difficult wound on the lateral aspect of the left fifth metatarsal head. The wound was almost ballotable last week I opened it slightly expecting to see purulence however there was just bleeding. I cultured this this was negative. X-ray unchanged. We are trying to get an MRI but I am not sure were going to be able to get this through his insurance. He also has an area on the right lateral fifth metatarsal head this looks healthier 12/3; the patient finally got our MRI. Surprisingly this did not show osteomyelitis. I did show the soft tissue ulceration at the lateral plantar  aspect of the fifth metatarsal base with a tiny residual 6 mm abscess overlying the superficial fascia I have tried to culture this area I have not been able to get this to grow anything. Nevertheless the protruding tissue looks aggravated. I suspect we should try to treat the underlying "abscess with broad-spectrum antibiotics. I am going to start him on Levaquin and Flagyl. He has much less edema in his legs and I am going to continue to wrap his legs and see him weekly 12/10. I started Levaquin and Flagyl on him last week. He just picked up the Flagyl apparently there was some delay. The worry is the wound on the left fifth metatarsal base which is substantial and worsening. His foot looks like he inverts at the ankle making this a weightbearing surface. Certainly no improvement in fact I think the measurements of this are somewhat worse. We have been using 12/17; he apparently just got the Levaquin yesterday  this is 2 weeks after the fact. He has completed the Flagyl. The area over the left fifth metatarsal base still has protruding granulation tissue although it does not look quite as bad as it did some weeks ago. He has severe bilateral lymphedema although we have not been treating him for wounds on his legs this is definitely going to require compression. There was so much edema in the left I did not wish to put him in a total contact cast today. I am going to increase his compression from 3-4 layer. The area on the right lateral fifth met head actually look quite good and superficial. 12/23; patient arrived with callus on the right fifth met head and the substantial hyper granulated callused wound on the base of his fifth metatarsal. He says he is completing his Levaquin in 2 days but I do not think that adds up with what I gave him but I will have to double check this. We are using Hydrofera Blue on both areas. My plan is to put the left leg in a cast the week after New Year's 04/06/2020;  patient's wounds about the same. Right lateral fifth metatarsal head and left lateral foot over the base of the fifth metatarsal. There is undermining on the left lateral foot which I removed before application of total contact cast continuing with Hydrofera Blue new. Patient tells me he was seen by endocrinology today lab work was done [Dr. Kerr]. Also wondering whether he was referred to cardiology. I went over some lab work from previously does not have chronic renal failure certainly not nephrotic range proteinuria he does have very poorly controlled diabetes but this is not his most updated lab work. Hemoglobin A1c has been over 11 1/10; the patient had a considerable amount of leakage towards mid part of his left foot with macerated skin however the wound surface looks better the area on the right lateral fifth met head is better as well. I am going to change the dressing on the left foot under the total contact cast to silver alginate, continue with Hydrofera Blue on the right. 1/20; patient was in the total contact cast for 10 days. Considerable amount of drainage although the skin around the wound does not look too bad on the left foot. The area on the right fifth metatarsal head is closed. Our nursing staff reports large amount of drainage out of the left lateral foot wound 1/25; continues with copious amounts of drainage described by our intake staff. PCR culture I did last week showed E. coli and Enterococcus faecalis and low quantities. Multiple resistance genes documented including extended spectrum beta lactamase, MRSA, MRSE, quinolone, tetracycline. The wound is not quite as good this week as it was 5 days ago but about the same size 2/3; continues with copious amounts of malodorous drainage per our intake nurse. The PCR culture I did 2 weeks ago showed E. coli and low quantities of Enterococcus. There were multiple resistance genes detected. I put Neosporin on him last week although  this does not seem to have helped. The wound is slightly deeper today. Offloading continues to be an issue here although with the amount of drainage she has a total contact cast is just not going to work 2/10; moderate amount of drainage. Patient reports he cannot get his stocking on over the dressing. I told him we have to do that the nurse gave him Roh, [name] ([id number]) [id number] Page 5 of 21 suggestions on how to make this  work. The wound is on the bottom and lateral part of his left foot. Is cultured predominantly grew low amounts of Enterococcus, E. coli and anaerobes. There were multiple resistance genes detected including extended spectrum beta lactamase, quinolone, tetracycline. I could not think of an easy oral combination to address this so for now I am going to do topical antibiotics provided by [facility] I think the main agents here are vancomycin and an aminoglycoside. We have to be able to give him access to the wounds to get the topical antibiotic on 2/17; moderate amount of drainage this is unchanged. He has his Keystone topical antibiotic against the deep tissue culture organisms. He has been using this and changing the dressing daily. Silver alginate on the wound surface. 2/24; using Keystone antibiotic with silver alginate on the top. He had too much drainage for a total contact cast at one point although I think that is improving and I think in the next week or 2 it might be possible to replace a total contact cast I did not do this today. In general the wound surface looks healthy however he continues to have thick rims of skin and subcutaneous tissue around the wide area of the circumference which I debrided 06/04/2020 upon evaluation today patient appears to be doing well in regard to his wound. I do feel like he is showing signs of improvement. There is little bit of callus and dead tissue around the edges of the wound as well as what appears to  be a little bit of a sinus tract that is off to the side laterally I would perform debridement to clear that away today. 3/17; left lateral foot. The wound looks about the same as I remember. Not much depth surface looks healthy. No evidence of infection 3/25; left lateral foot. Wound surface looks about the same. Separating epithelium from the circumference. There really is no evidence of infection here however not making progress by my view 3/29; left lateral foot. Surface of the wound again looks reasonably healthy still thick skin and subcutaneous tissue around the wound margins. There is no evidence of infection. One of the concerns being brought up by the nurses has again the amount of drainage vis--vis continued use of a total contact cast 4/5; left lateral foot at roughly the base of the fifth metatarsal. Nice healthy looking granulated tissue with rims of epithelialization. The overall wound measurements are not any better but the tissue looks healthy. The only concern is the amount of drainage although he has no surrounding maceration with what we have been doing recently to absorb fluid and protect his skin. He also has lymphedema. 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 He states that the kerlix/coban under the TCC rolled down and could not get it back up. He has been using an offloading scooter and has somehow hurt his right foot using this device. This happened last week. He states that the side of his right foot developed a blister and opened. The top of his foot also has a few small open wounds he thinks is due to his socks rubbing in his shoes. He has not been using any dressings to the wound. He denies purulent drainage, fever/chills  or erythema  to the wounds. 4/22; patient presents for 1 week follow-up. He developed new wounds to the right foot that were evaluated at last clinic visit. He continues to have a total contact cast to the left leg and he reports no issues. He has been using silver collagen to the right foot wounds with no issues. He denies purulent drainage, fever/chills or erythema to the right foot wounds. He has no complaints today 4/25; patient presents for 1 week follow-up. He has a total contact cast of the left leg and reports no issues. He has been using silver alginate to the right foot wound. He denies purulent drainage, fever/chills or erythema to the right foot wounds. 5/2 patient presents for 1 week follow-up. T contact cast on the left. The wound which is on the base of the plantar foot at the base of the fifth metatarsal otal actually looks quite good and dimensions continue to gradually contract. HOWEVER the area on the right lateral fifth metatarsal head is much larger than what I remember from 2 weeks ago. Once more is he has significant levels of hypergranulation. Noteworthy that he had this same hyper granulated response on his wound on the left foot at one point in time. So much so that he I thought there was an underlying fluid collection. Based on this I think this just needs debridement. 5/9; the wound on the left actually continues to be gradually smaller with a healthy surface. Slight amount of drainage and maceration of the skin around but not too bad. However he has a large wound over the right fifth metatarsal head very much in the same configuration as his left foot wound was initially. I used silver nitrate to address the hyper granulated tissue no mechanical debridement 5/16; area on the left foot did not look as healthy this week deeper thick surrounding macerated skin and subcutaneous tissue. The area on the right foot fifth met head was about the same The area on the right ankle  that we identified last week is completely broken down into an open wound presumably a stocking rubbing issue 5/23; patient has been using a total contact cast to the left side. He has been using silver alginate underneath. He has also been using silver alginate to the right foot wounds. He has no complaints today. He denies any signs of infection. 5/31; the left-sided wound looks some better measure smaller surface granulation looks better. We have been using silver alginate under the total contact cast The large area on his right fifth met head and right dorsal foot look about the same still using silver alginate 6/6; neither side is good as I was hoping although the surface area dimensions are better. A lot of maceration on his left and right foot around the wound edge. Area on the dorsal right foot looks better. He says he was traveling. I am not sure what does the amount of maceration around the plantar wounds may be drainage issues 6/13; in general the wound surfaces look quite good on both sides. Macerated skin and raised edges around the wound required debridement although in general especially on the left the surface area seems improved. The area on the right dorsal ankle is about the same I thought this would not be such a problem to close 6/20; not much change in either wound although the one on the right looks a little better. Both wounds have thick macerated edges to the skin requiring debridements. We have been using silver alginate. The area  on the dorsal right ankle is still open I thought this would be closed. 6/28; patient comes in today with a marked deterioration in the right foot wound fifth met head. Wide area of exposed bone this is a drastic change from last time. The area on the left there we have been casting is stagnant. We have been using silver alginate in both wound areas. 7/5; bone culture I did for PCR last time was positive for Pseudomonas, group B strep, Enterococcus  and Staph aureus. There was no suggestion of methicillin resistance or ampicillin resistant genes. This was resistant to tetracycline however He comes into the clinic today with the area over his right plantar fifth metatarsal head which had been doing so well 2 weeks ago completely necrotic feeling bone. I do not know that this is going to be salvageable. The left foot wound is certainly no smaller but it has a better surface and is superficial. 7/8; patient called in this morning to say that his total contact cast was rubbing against his foot. He states he is doing fine overall. He denies signs of infection. 7/12; continued deterioration in the wound over the right fifth metatarsal head crumbling bone. This is not going to be salvageable. The patient agrees and wants to be referred to Dr. Doran Durand which we will attempt to arrange as soon as possible. I am going to continue him on antibiotics as long as that takes so I will renew those today. The area on the left foot which is the base of the fifth metatarsal continues to look somewhat better. Healthy looking tissue no depth no debridement is necessary here. 7/20; the patient was kindly seen by Dr. Doran Durand of orthopedics on 10/19/2020. 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 Towards this end we have taken him out of the total contact cast on the left we will put him in a wrap with Hydrofera Blue. As I understand things surgery is planned for 7/21 7/27; patient had his surgery last Thursday. 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 Apparently everything went well we did not still disturb that today The area on the left foot actually looks quite good. He has been much less mobile which probably explains this he did not seem to do well in the total contact cast secondary to drainage and maceration I think. We have been using Hydrofera Blue 11/09/2020 upon  evaluation today patient appears to be doing well with regard to his plantar foot ulcer on the left foot. Fortunately there is no evidence of active infection at this time. No fevers, chills, nausea, vomiting, or diarrhea. Overall I think that he is actually doing extremely well. Nonetheless I do believe that he GURNEY, [name] ([id number]) [id number] Page 6 of 21 is staying off of this more following the surgery in his right foot that is the reason the left is doing so great. 8/16; left plantar foot wound. This looks smaller than the last time I saw this he is using Hydrofera Blue. The surgical wound on the right foot is being followed by Dr. Doran Durand we did not look at this today. He has surgical shoes on both feet 8/23; left plantar foot wound not as good this week. Surrounding macerated skin and subcutaneous tissue everything looks moist and wet. I do not think he is offloading this adequately. He is using a surgical shoe Apparently the right  foot surgical wound is not open although I did not check his foot 8/31; left plantar foot lateral aspect. Much improved this week. He has no maceration. Some improvement in the surface area of the wound but most impressively the depth is come in we are using silver alginate. The patient is a Product/process development scientist. He is asked that we write him a letter so he can go back to work. I have also tried to see if we can write something that will allow him to limit the amount of time that he is on his foot at work. 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 Hopefully the latter can be avoided 9/6; I believe the patient missed an appointment last week. He arrives in today with a wound looking roughly the same certainly no better. Undermining laterally and also inferiorly. We used molecuLight today in training with the patient's permission.. We are using silver  alginate 9/21 wound is measuring bigger this week although this may have to do with the aggressive circumferential debridement last week in response to the blush fluorescence on the MolecuLight. Culture I did last week showed significant MSSA and E. coli. I put him on Augmentin but he has not started it yet. We are also going to send this for compounded antibiotics at [facility]. There is no evidence of systemic infection 9/29; silver alginate. His Keystone arrived. He is completing Augmentin in 2 days. Offloading in a cam boot. Moderate drainage per our intake staff 10/5; using silver alginate. He has been using his [place]. He has completed his Augmentin. Per our intake nurse still a lot of drainage, far too much to consider a total contact cast. Wound measures about the same. He had the same undermining area that I defined last week from a roughly 11-3. I remove this today 10/12; using silver alginate he is using the [place]. He comes in for a nurse visit hence we are applying [employer] twice a week. Measuring slightly better today and less notable drainage. Extensive debridement of the wound edge last time 10/18; using topical Keystone and silver alginate and a soft cast. Wound measurements about the same. Drainage was through his soft cast. We are changing this twice a week Tuesdays and Friday 10/25; comes in with moderate drainage. Still using Keystone silver alginate and a soft cast. Wound dimensions completely the same.He has a lot of edema in the left leg he has lymphedema. Asking for [name] to consider wrapping him as he cannot get his stocking on over the soft cast 11/2; comes in with moderate to large drainage slightly smaller in terms of width we have been using [place]. His wound looks satisfactory but not much improvement 11/4; patient presents today for obligatory cast change. Has no issues or complaints today. He denies signs of infection. 11/9; patient traveled this weekend to [place], was  on the cast quite a bit. Staining of the cast with black material from his walking boot. Drainage was not quite as bad as we feared. Using silver alginate and Keystone 11/16; we do not have size for cast therefore we have been putting a soft cast on him since the change on Friday. Still a significant amount of drainage necessitating changing twice a week. We have been using the Keystone at cast changes either hard or soft as well as silver alginate Comes in the clinic with things actually looking fairly good improvement in width.  He says his offloading is about the same 02/24/2021 upon evaluation today patient actually comes back in and is doing excellent in regard to his foot ulcer this is significantly smaller even compared to the last visit. The soft cast seems to have done extremely well for him which is great news. I do not see any signs of infection minimal debridement will be needed today. 11/30; left lateral foot much improved half a centimeter improvement in surface area. No evidence of infection. He seems to be doing better with the soft cast in the TCC therefore we will continue with this. He comes back in later in the week for a change with the nurses. This is due to drainage 12/6; no improvement in dimensions. Under illumination some debris on the surface we have been using silver alginate, soft cast. If there is anything optimistic here he seems to have have less drainage 12/13. Dimensions are improved both length and width and slightly in depth. Appears to be quite healthy today. Raised edges of this thick skin and callus around the edges however. He is in a soft cast were bringing him back once for a change on Friday. Drainage is better 12/20. Dimensions are improved. He still has raised edges of thick skin and callus around the edges. We are using a soft cast 12/28; comes in today with thick callus around the wound. Using silver under alginate under a soft cast. I do not think there  is much improvement in any measurement 2023 04/06/2021; patient was put in a total contact cast. Unfortunately not much change in surface area 1/10; not much different still thick callus and skin around the edge in spite of the total contact cast. This was just debrided last week we have been using the [facility] compounded antibiotic and silver alginate under a total contact cast 1/18 the patient's wound on the left side is doing nicely. smaller HOWEVER he comes in today with a wound on the right foot laterally. blister most likely serosangquenous drainage 1/24; the patient continues to do well in terms of the plantar left foot which is continued to contract using silver alginate under the total contact cast HOWEVER the right lateral foot is bigger with denuded skin around the edges. I used pickups and a #15 scalpel to remove this this looks like the remanence of a large blister. Cannot rule out infection. Culture in this area I did last week showed Staphylococcus lugdunensis few colonies. I am going to try to address this with his [employer] antibiotic that is done so well on the left having linezolid and this should cover the staph 2/1; the patient's wound on his left foot which was the original plantar foot wound thick skin and eschar around the edges even in the total contact cast but the wound surface does not look too bad The real problem is on how his right lateral foot at roughly the base of the fifth metatarsal. The wound is completely necrotic more worrisome than that there is swelling around the edges of this. We have been using silver alginate on both wounds and Keystone on the right foot. Unfortunately I think he is going to require systemic antibiotics while we await cultures. He did not get the x-ray done that we ordered last week [lost the prescription 2/7; disappointingly in the area on the left foot which we are treating with a total contact cast is still not closed although it is much  smaller. He continues to have a lot of callus  around the wound edge. -Right lateral foot culture I did last week was negative x-ray also negative for osteomyelitis. 2/15: TCC silver alginate on the left and silver alginate on the right lateral. No real improvement in either area 05/26/2021: T oday, the wounds are roughly the same size as at his previous visit, post-debridement. He continues to endorse fairly substantial drainage, particularly on the right. He has been in a total contact cast on the left. There is still some callus surrounding this lesion. On the right, the periwound skin is quite macerated, along with surrounding callus. The center of the right-sided wound also has some dark, densely adherent material, which is very difficult to remove. 06/02/2021: Today, both wounds are slightly smaller. He has been using zinc oxide ointment around the right ulcer and the degree of maceration has improved markedly. There continues to be an area of nonviable tissue in the center of the right sided ulcer. The left-sided wound, which has been in the total contact cast. Appears clean and the degree of callus around it is less than previously. 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 He had to take the stairs and both wounds have increased in size. The left foot, which has been in a total contact cast, has developed a tunnel tracking to the lateral aspect of his foot. The nonviable tissue in the center of the right-sided ulcer remains recalcitrant to debridement. There is significant undermining surrounding the entirety of the left sided wound. 06/16/2021: The elevator at school has been fixed and the patient has been able to avoid putting as much weight on his wounds over the past week. We converted the left foot wound into a single lesion today, but despite  this, the wound is actually smaller. The base is healthy with limited periwound callus. On the right, the central necrotic area is still present. He continues to be quite macerated around the right sided wound, despite applying barrier cream. This does, however, have the benefit of softening the callus to make it more easily removable. 06/23/2021: Today, the left wound is smaller. The lateral aspect that had opened up previously is now closed. The wound base has a healthy bed of granulation tissue and minimal slough. Unfortunately, on the right, the wound is larger and continues to be fairly macerated. He has also reopened the wound at his right ankle. He thinks this is due to the gait he has adopted secondary to his total contact cast and boot. 06/30/2021: T oday, both wounds are a little bit larger. The lateral aspect on the left has remained closed. He continues to have significant periwound maceration. The culture that I took from the right sided wound grew a population of bacteria that is not covered by his current [facility] antibiotic. The center of the right- sided wound continues to appear necrotic with nonviable fat. It probes deeper today, but does not reach bone. 07/07/2021: The periwound maceration is a little bit less today. The right lateral foot wound has some areas that appear more viable and the necrotic center also looks a little bit better. The wound on the dorsal surface of his right foot near the ankle is contracting and the surface appears healthy. The left plantar wound surface looks healthy, but there is some new undermining on the medial portion. He did get his new Keystone antibiotic and began applying that to the right foot wound on  Saturday. 07/14/2021: The intake nurse reported substantial drainage from his wounds, but the periwound skin actually looks better than is typical for him. The wound on the dorsal surface of his right foot near the ankle is smaller and just has a small  open area underneath some dried eschar. The left plantar wound surface looks healthy and there has been no significant accumulation of callus. The right lateral foot wound looks quite a bit better, with the central portion, which typically appears necrotic, looking more viable albeit pale. 07/22/2021: His left foot is extremely macerated today. The wound is about the same size. The wound on the dorsal surface of his right foot near the ankle had closed, but he traumatized it removing the dressing and there is a tiny skin tear in that location. The right lateral foot wound is bigger, but the surface appears healthy. 07/30/2021: The wound on the dorsal surface of his right foot near the ankle is closed. The right lateral foot wound again is a little bit bigger due to some undermining. The periwound skin is in better condition, however. He has been applying zinc oxide. The wound surface is a little bit dry today. On the left, he does not have the substantial maceration that we frequently see. The wound itself is smaller and has a clean surface. 08/06/2021: Both wounds seem to have deteriorated over the past week. The right lateral foot wound has a dry surface but the periwound is boggy.. Overall wound dimensions are about the same. On the left, the wound is about the same size, but there is more undermining present underneath periwound callus. 08/13/2021: The right sided wound looks about the same, but on the left there has been substantial deterioration. The undermining continues to extend under periwound callus. Once this was removed, substantial extension of the wound was present. There is no odor or purulent drainage but clearly the wounds have broken down. 08/20/2021: The wounds look about the same today. He has been out of his total contact cast and has just been changing the dressings using topical Keystone with PolyMem Ag, Kerlix and Ace bandages. The wound on the top of his right ankle has reopened  but this is quite small. There was a little bit of purulent material that I expressed when examining this wound. 08/24/2021: After the aggressive debridement I performed at his last visit, the wounds actually look a little bit better today. They are smaller with the exception of the wound on the top of his right ankle which is a little bit bigger as some more skin pulled off when he was changing his dressing. We are using topical Keystone with PolyMem Ag Kerlix and Ace bandages. 09/02/2021: There has been really no change to any of his wounds. 09/16/2021: The patient was hospitalized last week with nausea, vomiting, and dehydration. He says that while he was in the hospital, his wounds were not really addressed properly. T oday, both plantar foot wounds are larger and the periwound skin is macerated. The wound on the dorsum of his right foot has a scab on the top. The right foot now has a crater where previously he had had nonviable fat. It looks as though this simply died and fell out. The periwound callus is wet. 09/24/2021: His wounds have deteriorated somewhat since his last visit. The wound on the dorsum of his right foot near his ankle is larger and has more nonviable tissue present. The crater in his right foot is even deeper; I cannot quite  palpate or probe to bone but I am sure it is close. The wound on his left plantar foot has an odd boggy area in the center that almost feels as though it has fluid within it. He has run out of his topical Keystone antibiotic. We are using silver alginate on his wounds. 09/29/2021: He has developed a new wound on the dorsum of his left foot near his ankle. He says he thinks his wrapping is rubbing in that site. I would concur with this as the wound on his right ankle is larger. The left foot looks about the same. The right foot has the crater that was present last week. No significant slough accumulation, but his foot remains quite swollen and warm despite oral  antibiotic therapy. 10/08/2021: All of his wounds look about the same as last week. He did not start his oral antibiotics that are prescribed until just a couple of days ago; his [employer] compounded antibiotics formula has been changed and he is awaiting delivery of the new recipe. His MRI that was scheduled for earlier this week was canceled as no prior authorization had been obtained; unfortunately the tech responsible sent an email to my old [employer] email, which I no longer use nor have access to. 10/18/2021: The wounds on his bilateral dorsal feet near the ankles are both improved. They are smaller and have just some eschar and slough buildup. The left plantar wound has a fair amount of undermining, but the surface is clean. There is some periwound callus accumulation. On the right plantar foot, there is nonviable fat leading to a deep tunnel that tracks towards his dorsal medial foot. There is periwound callus and slough accumulation, as well. His right foot and leg remain swollen as compared to the left. 10/25/2021: The wounds on his bilateral dorsal feet and at the ankles have broken down somewhat. They are little bit larger than last week. The left plantar wound continues to undermine laterally but the surface is clean. The right plantar foot wound shows some decreased depth in the tunnel tracking towards his dorsal medial foot. He has not yet had the Doppler study that I ordered; it sounds like there is some confusion about the scheduling of the procedure. In addition, the MRI was denied and I have taken steps to appeal the denial. 11/24/2021: Since our last visit, Mr. Goulette was admitted to the hospital where an MRI suggested osteomyelitis. He was taken the operating room by podiatry. Bone biopsies were negative for osteomyelitis. They debrided his wounds and applied myriad matrix. He saw them last week and they removed his staples. He is here today to continue his wound healing process. T  oday, both of the dorsal foot/ankle wounds are substantially smaller. There is just a little eschar overlying the left sided wound and some eschar and slough on the right. The right plantar foot ulcer has the healthiest surface of granulation tissue that I have seen to date. A portion of the myriad matrix failed to take and was hanging loose. It appears that myriad morcells were placed into the tunnel closest to the dorsal portion of his foot. These have sloughed off. The left plantar foot ulcer is about the same size, but has a much healthier surface than in the past. Both plantar ulcers have callus and slough accumulation. 12/07/2021: Left dorsal foot/ankle wound is closed. The right dorsal foot/ankle wound is nearly closed and just has a small open area with some eschar and slough. The right plantar  foot wound has contracted quite a bit since our last visit. It has a healthy surface with just a little bit of slough accumulation and periwound callus buildup. The left plantar foot wound is about the same size but the surface appears healthy. There is a little slough and periwound callus on this side, as well. Jerde, [name] ([id number]) [id number] Page 8 of 21 12/15/2021: Both dorsal foot/ankle wounds are closed. The right plantar foot wound is substantially smaller than at our last visit. The tunneling that was present has nearly closed. There is just a little bit of slough buildup. The left plantar foot wound is also a little bit smaller today. The surface is the healthiest that I have ever seen it. Light slough and periwound callus accumulation on this side. 12/22/2021: The dorsal ankle/foot wounds remain closed. The right plantar foot wound continues to contract. There is still a bit of depth at the lateral portion of the wound but the surface has a good granulated appearance. The wound on the left is about the same size, the central indented portion is still adherent. It  also is clean with good granulation tissue present. The periwound skin and callus are a bit macerated, however. 12/29/2021: Both ankle wounds remain closed. The right plantar foot wound is less than half the size that it was last week. There is no depth any longer and the surface has just a little bit of slough and periwound callus. On the left, the wound is not much smaller, but the surface is healthier with good granulation tissue. There is also a little slough and periwound callus buildup. 01/05/2022: The right plantar foot wound continues to contract and is once again about half the size as it was last week. There is just a little bit of surface slough and periwound callus. On the left, the depth of the wound has decreased and the diameter has started to contract. It is also very clean with just a little slough and biofilm present. 01/12/2022: The right plantar foot wound is smaller again today. There is just some periwound callus and slough accumulation. On the left, there is a fair amount of undermining and the surface is a little boggy, but no other significant change. 01/19/2022: The right plantar wound continues to contract. There is minimal periwound callus accumulation and just a light layer of slough on the surface. On the left, the surface looks better, but there is fairly significant undermining near the 11:00 portion of the wound, aiming towards his great toe. The skin overlying this area is very healthy and has a good fat layer present. No malodor or purulent drainage. 01/26/2022: The right sided wound continues to contract and has just a light layer of slough on the surface. Minimal periwound callus. On the left, he still has substantial undermining and the tissue surface is not as robust as I would like to see. No malodor or purulent drainage, but he did say he had a lot of serous drainage over the weekend. 02/02/2022: The right foot wound is about a quarter of the size that it was at  his last visit. There is just a little slough on the surface and some periwound callus. On the left, the undermining persists and the tissue is still more pale, but he does not seem to have had as much drainage over the last weekend. We are waiting on a wound VAC. 02/09/2022: His right foot has healed. He received the wound VAC, but did not bring it to clinic with  him today. The lateral aspect of the left foot wound has come in a little, but he still has substantial undermining on the medial and distal portion of the wound. Still with macerated periwound callus. 02/16/2022: The wound VAC was applied last Friday. T oday, there has been tremendous improvement in the surface of the wound bed. The undermined portion of the wound has come in a bit. There is still some overhanging dead skin. Overall the wound looks much better. 02/23/2022: No significant change in the overall wound dimensions, but the wound surface continues to improve and appears healthier and more robust. There is some periwound callus accumulation and overhanging skin. No concern for infection. 03/02/2022: Although the measurements taken in clinic today were unchanged, the visual appearance of the wound looks like it is smaller and the undermining/tunneling is filling with flesh. There is less periwound tissue maceration than usual. No malodor or purulent drainage. No concern for infection. 03/09/2022: The undermining has come in by couple of millimeters. The periwound is a little bit more macerated this week. No concern for infection. 12/13; substantial wound on the left plantar foot. We are using silver collagen and a wound VAC. 03/23/2022: The undermining continues to contract. The wound surface is a little bit dry. Electronic Signature(s) Signed: 03/23/2022 8:55:55 AM By: Max Maudlin MD FACS Entered By: [name] on 03/23/2022  08:55:55 -------------------------------------------------------------------------------- Physical Exam Details Patient Name: Date of Service: [name], [name] 03/23/2022 8:15 A M Medical Record Number: [id number] Patient Account Number: [id number] Date of Birth/Sex: Treating RN: July 28, 1985 (36 y.o. M) Primary Care Provider: [name] Other Clinician: Referring Provider: Treating Provider/Extender: [name] in Treatment: 125 Constitutional . . . . No acute distress. Respiratory Normal work of breathing on room air. Notes 03/23/2022: The undermining continues to contract. The wound surface is a little bit dry. Electronic Signature(s) Signed: 03/23/2022 8:58:03 AM By: Max Maudlin MD Chillum, [name] ([id number]) [id number] Page 9 of 21 Entered By: [name] on 03/23/2022 08:58:03 -------------------------------------------------------------------------------- Physician Orders Details Patient Name: Date of Service: [name] D 03/23/2022 8:15 A M Medical Record Number: [id number] Patient Account Number: [id number] Date of Birth/Sex: Treating RN: [birth year] (36 y.o. 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 - Dr. Celine [name] - Room 1 with [name] Return A Bathing/ Shower/ Hygiene May shower and wash wound with soap and water. Negative Presssure Wound Therapy Wound #3 Left,Lateral,Plantar Foot Wound Vac to wound continuously at 174m/hg pressure Black Foam Edema Control - Lymphedema / SCD / Other Bilateral  Lower Extremities Elevate legs to the level of the heart or above for 30 minutes daily and/or when sitting, a frequency of: - throughout the day Avoid standing for long periods of time. Exercise regularly Compression stocking or Garment 20-30 mm/Hg pressure to: - to both legs daily Off-Loading Open toe surgical shoe to: - Both feet Additional Orders / Instructions Follow Nutritious Diet Non Wound Condition pply the following to affected area as directed: - continue to apply foam donut or cushion to healed right plantar foot A Wound Treatment Wound #3 - Foot Wound Laterality: Plantar, Left, Lateral Cleanser: Soap and Water 3 x Per Week/30 Days  Discharge Instructions: May shower and wash wound with dial antibacterial soap and water prior to dressing change. Peri-Wound Care: Skin Prep (DME) (Generic) 3 x Per Week/30 Days Discharge Instructions: Use skin prep as directed Prim Dressing: Promogran Prisma Matrix, 4.34 (sq in) (silver collagen) (Generic) 3 x Per Week/30 Days ary Discharge Instructions: Moisten collagen with saline or hydrogel Prim Dressing: VAC ary 3 x Per Week/30 Days Secured With: Elastic Bandage 4 inch (ACE bandage) (DME) (Generic) 3 x Per Week/30 Days Discharge Instructions: Secure with ACE bandage as directed. Secured With: 3M Medipore Public affairs consultant Surgical T 2x10 (in/yd) (DME) (Generic) 3 x Per Week/30 Days ape Discharge Instructions: Secure with tape as directed. Compression Wrap: Kerlix Roll 4.5x3.1 (in/yd) (DME) (Generic) 3 x Per Week/30 Days Discharge Instructions: Apply Kerlix and Coban compression as directed. Podolak, [name] ([id number]) [id number] Page 10 of 21 Electronic Signature(s) Signed: 03/23/2022 9:52:00 AM By: Max Maudlin MD FACS Signed: 03/24/2022 4:49:39 PM By: Max East RN Previous Signature: 03/23/2022 9:12:08 AM Version By: Max Maudlin MD FACS Entered By: [name] on 03/23/2022  09:22:04 -------------------------------------------------------------------------------- Problem List Details Patient Name: Date of Service: [name], [name] 03/23/2022 8:15 A M Medical Record Number: [id number] Patient Account Number: [id number] Date of Birth/Sex: Treating RN: [birth year] (36 y.o. M) Primary Care Provider: [name] Other Clinician: Referring Provider: Treating Provider/Extender: [name] in Treatment: 125 Active Problems ICD-10 Encounter Code Description Active Date MDM Diagnosis E11.621 Type 2 diabetes mellitus with foot ulcer 10/24/2019 No Yes L97.528 Non-pressure chronic ulcer of other part of left foot with other specified 10/24/2019 No Yes severity Inactive Problems ICD-10 Code Description Active Date Inactive Date L97.518 Non-pressure chronic ulcer of other part of right foot with other specified severity 07/14/2020 07/14/2020 L97.518 Non-pressure chronic ulcer of other part of right foot with other specified severity 10/24/2019 10/24/2019 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, right ankle and foot 10/24/2019 10/24/2019 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 08/10/2020 08/10/2020 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, left ankle and foot 10/24/2019 10/24/2019 L97.322 Non-pressure chronic ulcer of left ankle with fat layer exposed 09/29/2021 09/29/2021 B95.62 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 10/24/2019 10/24/2019 classified elsewhere Resolved Problems Electronic Signature(s) Signed: 03/23/2022 8:54:17 AM By: Max Maudlin MD FACS Entered By: [name] on 03/23/2022 08:54:17 Zegers, [name] ([id number]) [id number] Page 11 of 21 -------------------------------------------------------------------------------- Progress Note Details Patient Name: Date of Service: [name] D 03/23/2022 8:15 A M Medical  Record Number: [id number] Patient Account Number: [id number] Date of Birth/Sex: Treating RN: 05-Jul-1985 (36 y.o. 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 He comes in for review of a wound over the plantar fifth metatarsal head extending into the lateral part of the foot. He was followed for this previously by his podiatrist Dr. Cornelius [name]. As the patient tells his story he went to see podiatry first for a swelling he developed on the lateral part of his fifth metatarsal head in May. He states this was "open" by  podiatry and the area closed. He was followed up in June and it was again opened callus removed and it closed promptly. There were plans being made for surgery on the fifth metatarsal head in June however his blood sugar was apparently too high for anesthesia. Apparently the area was debrided and opened again in June and it is never closed since. Looking over the records from podiatry I am really not able to follow this. It was clear when he was first seen it was before 5/14 at that point he already had a wound. By 5/17 the ulcer was resolved. I do not see anything about a procedure. On 5/28 noted to have pre-ulcerative moderate keratosis. X-ray noted 1/5 contracted toe and tailor's bunion and metatarsal deformity. On a visit date on 09/28/2018 the dorsal part of the left foot it healed and resolved. There  was concern about swelling in his lower extremity he was sent to the ER.. As far as I can tell he was seen in the ER on 7/12 with an ulcer on his left foot. A DVT rule out of the left leg was negative. I do not think I have complete records from podiatry but I am not able to verify the procedures this patient states he had. He states after the last procedure the wound has never closed although I am not able to follow this in the records I have from podiatry. He has not had a recent x-ray The patient has been using Neosporin on the wound. He is wearing a Darco shoe. He is still very active up on his foot working and exercising. Past medical history; type 2 diabetes ketosis-prone, leg swelling with a negative DVT study in July. Non-smoker ABI in our clinic was 0.85 on the left 10/16; substantial wound on the plantar left fifth met head extending laterally almost to the dorsal fifth MTP. We have been using silver alginate we gave him a Darco forefoot off loader. An x-ray did not show evidence of osteomyelitis did note soft tissue emphysema which I think was due to gas tracking through an open wound. There is no doubt in my mind he requires an MRI 10/23; MRI not booked until 3 November at the earliest this is largely due to his glucose sensor in the right arm. We have been using silver alginate. There has been an improvement 10/29; I am still not exactly sure when his MRI is booked for. He says it is the third but it is the 10th in epic. This definitely needs to be done. He is running a low-grade fever today but no other symptoms. No real improvement in the 1 02/26/2019 patient presents today for a follow-up visit here in our clinic he is last been seen in the clinic on October 29. Subsequently we were working on getting MRI to evaluate and see what exactly was going on and where we would need to go from the standpoint of whether or not he had osteomyelitis and again what treatments were going be  required. Subsequently the patient ended up being admitted to the hospital on 02/07/2019 and was discharged on 02/14/2019. This is a somewhat interesting admission with a discharge diagnosis of pneumonia due to COVID-19 although he was positive for COVID-19 when tested at the urgent care but negative x2 when he was actually in the hospital. With that being said he did have acute respiratory failure with hypoxia and it was noted he also have a left foot ulceration with osteomyelitis. With that being  said he did require oxygen for his pneumonia and I level 4 L. He was placed on antivirals and steroids for the COVID-19. He was also transferred to the [employer] at one point. Nonetheless he did subsequently discharged home and since being home has done much better in that regard. The CT angiogram did not show any pulmonary embolism. With regard to the osteomyelitis the patient was placed on vancomycin and Zosyn while in the hospital but has been changed to Augmentin at discharge. It was also recommended that he follow- up with wound care and podiatry. Podiatry however wanted him to see [name] according to the patient prior to them doing anything further. His hemoglobin A1c was 9.9 as noted in the hospital. Have an MRI of the left foot performed while in the hospital on 02/04/2019. 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 There is an overlying plantar open wound noted an abscess tracking back along the lateral aspect of the fifth metatarsal shaft. There is otherwise diffuse cellulitis and mild fasciitis without findings of polymyositis. The patient did have recently pneumonia secondary to COVID-19 I looked in the chart through epic and it does appear that the patient may need to have an additional x-ray just to ensure everything is cleared and that he has no airspace disease prior to putting him into the  [name]. 03/05/2019; patient was readmitted to the clinic last week. He was hospitalized twice for a viral upper respiratory tract infection from 11/1 through 11/4 and then 11/5 through 11/12 ultimately this turned out to be Covid pneumonitis. Although he was discharged on oxygen he is not using it. He says he feels fine. He has no exercise limitation no cough no sputum. His O2 sat in our clinic today was 100% on room air. 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 He received Vanco and Zosyn in the hospital and then was discharged on 2 weeks of Augmentin. I do not see any relevant cultures. He was supposed to follow-up with infectious disease but I do not see that he has an appointment. 12/8; patient saw Dr. Novella Olive of infectious disease last week. He felt that he had had adequate antibiotic therapy. He did not go to follow-up with Dr. Amalia Hailey of podiatry and I have again talked to him about the pros and cons of this. He does not want to consider a ray amputation of this time. He is aware of the risks of recurrence, migration etc. He started HBO today and tolerated this well. He can complete the Augmentin that I gave him last week. I have looked over the lab work that Dr. Chana Bode ordered his C-reactive protein was 3.3 and his sedimentation rate was 17. The C-reactive protein is never really been measurably that high in this patient 12/15; not much change in the wound today however he has undermining along the lateral part of the foot again more extensively than last week. He has some rims of epithelialization. We have been using silver alginate. He is undergoing hyperbarics but did not dive today 12/18; in for his obligatory first total contact cast change. Unfortunately there was pus coming from the undermining area around his fifth metatarsal head. This was cultured but will preclude  reapplication of a cast. He is seen in conjunction with HBO 12/24; patient had staph lugdunensis  in the wound in the undermining area laterally last time. We put him on doxycycline which should have covered this. The Livesey, [name] ([id number]) [id number] Page 12 of 21 wound looks better today. I am going to give him another week of doxycycline before reattempting the total contact cast 12/31; the patient is completing antibiotics. Hemorrhagic debris in the distal part of the wound with some undermining distally. He also had hyper granulation. Extensive debridement with a #5 curette. The infected area that was on the lateral part of the fifth met head is closed over. I do not think he needs any more antibiotics. Patient was seen prior to HBO. Preparations for a total contact cast were made in the cast will be placed post hyperbarics 04/11/19; once again the patient arrives today without complaint. He had been in a cast all week noted that he had heavy drainage this week. This resulted in large raised areas of macerated tissue around the wound 1/14; wound bed looks better slightly smaller. Hydrofera Blue has been changing himself. He had a heavy drainage last week which caused a lot of maceration around the wound so I took him out of a total contact cast he says the drainage is actually better this week He is seen today in conjunction with HBO 1/21; returns to clinic. He was up in [place] for a day or 2 attending a funeral. He comes back in with the wound larger and with a large area of exposed bone. He had osteomyelitis and septic arthritis of the fifth left metatarsal head while he was in hospital. He received IV antibiotics in the hospital for a prolonged period of time then 3 weeks of Augmentin. Subsequently I gave him 2 weeks of doxycycline for more superficial wound infection. When I saw this last week the wound was smaller the surface of the wound looks  satisfactory. 1/28; patient missed hyperbarics today. Bone biopsy I did last time showed Enterococcus faecalis and Staphylococcus lugdunensis . He has a wide area of exposed bone. We are going to use silver alginate as of today. I had another ethical discussion with the patient. 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 In this situation I think the likelihood of healing this is low. Therefore I have recommended a ray amputation and with the patient's agreement I have referred him to Dr. Doran Durand. The other issue is that his compliance with hyperbarics has been minimal because of his work schedule and given his underlying decision I am going to stop this today READMISSION 10/24/2019 MRI 09/29/2019 left foot IMPRESSION: 1. Apparent skin ulceration inferior and lateral to the 5th metatarsal base with underlying heterogeneous T2 signal and enhancement in the subcutaneous fat. Small peripherally enhancing fluid collections along the plantar and lateral aspects of the 5th metatarsal base suspicious for abscesses. 2. Interval amputation through the mid 5th metatarsal with nonspecific low-level marrow edema and enhancement. Given the proximity to the adjacent soft tissue inflammatory changes, osteomyelitis cannot be excluded. 3. The additional bones appear unremarkable. MRI 09/29/2019 right foot IMPRESSION: 1. Soft tissue ulceration lateral to the 5th MTP joint. There is low-level T2 hyperintensity within the 4th and 5th metatarsal heads and adjacent proximal phalanges without abnormal T1 signal or cortical destruction. These findings are nonspecific and could be seen with early marrow edema, hyperemia or early osteomyelitis. No evidence of septic joint. 2. Mild tenosynovitis and synovial enhancement associated with the extensor digitorum tendons at the level of the midfoot. 3. Diffuse low-level muscular T2  hyperintensity and enhancement, most consistent with  diabetic myopathy. LEFT FOOT BONE Methicillin resistant staphylococcus aureus Staphylococcus lugdunensis MIC MIC CIPROFLOXACIN >=8 RESISTANT Resistant <=0.5 SENSI... Sensitive CLINDAMYCIN <=0.25 SENS... Sensitive >=8 RESISTANT Resistant ERYTHROMYCIN >=8 RESISTANT Resistant >=8 RESISTANT Resistant GENTAMICIN <=0.5 SENSI... Sensitive <=0.5 SENSI... Sensitive Inducible Clindamycin NEGATIVE Sensitive NEGATIVE Sensitive OXACILLIN >=4 RESISTANT Resistant 2 SENSITIVE Sensitive RIFAMPIN <=0.5 SENSI... Sensitive <=0.5 SENSI... Sensitive TETRACYCLINE <=1 SENSITIVE Sensitive <=1 SENSITIVE Sensitive TRIMETH/SULFA <=10 SENSIT Sensitive <=10 SENSIT Sensitive ... Marland Kitchen.. VANCOMYCIN 1 SENSITIVE Sensitive <=0.5 SENSI... Sensitive Right foot bone . Component 3 wk ago Specimen Description BONE Special Requests RIGHT 4 METATARSAL SAMPLE B Gram Stain NO WBC SEEN NO ORGANISMS SEEN Culture RARE METHICILLIN RESISTANT STAPHYLOCOCCUS AUREUS NO ANAEROBES ISOLATED Performed at [facility], [place] [address], [place], [place] [address] Report Status 10/08/2019 FINAL Organism ID, Bacteria METHICILLIN RESISTANT STAPHYLOCOCCUS AUREUS Resulting Agency [facility] Susceptibility Methicillin resistant staphylococcus aureus MIC CIPROFLOXACIN >=8 RESISTANT Resistant CLINDAMYCIN <=0.25 SENS... Sensitive ERYTHROMYCIN >=8 RESISTANT Resistant GENTAMICIN <=0.5 SENSI... Sensitive Inducible Clindamycin NEGATIVE Sensitive Pontiff, [name] ([id number]) [id number] Page 13 of 21 OXACILLIN >=4 RESISTANT Resistant RIFAMPIN <=0.5 SENSI... Sensitive TETRACYCLINE <=1 SENSITIVE Sensitive TRIMETH/SULFA <=10 SENSIT Sensitive ... VANCOMYCIN 1 SENSITIVE Sensitive This is a patient we had in clinic earlier this year with a wound over his left fifth metatarsal head. 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 Doran Durand on 05/16/2019. He seems to have developed problems with wounds on his bilateral feet in June 2021 although he may have had problems earlier than this. He was in an urgent care with a right foot ulcer on 09/26/2019 and given a course of doxycycline. This was apparently after having trouble getting into see orthopedics. He was seen by podiatry on 09/28/2019 noted to have bilateral lower extremity ulcers including the left lateral fifth metatarsal base and the right subfifth met head. It was noted that had purulent drainage at that time. He required hospitalization from 6/20 through 7/2. This was because of worsening right foot wounds. He underwent bilateral operative incision and drainage and bone biopsies bilaterally. Culture results are listed above. He has been referred back to clinic by Dr. Jacqualyn Posey of podiatry. He is also followed by Dr. Megan Salon who saw him yesterday. He was discharged from hospital on Zyvox Flagyl and Levaquin and yesterday changed to doxycycline Flagyl and Levaquin. His inflammatory markers on 6/26 showed a sedimentation rate of 129 and a C-reactive protein of 5. This is improved to 14 and 1.3 respectively. This would indicate improvement. ABIs in our clinic today were 1.23 on the right and 1.20 on the left 11/01/2019 on evaluation today patient appears to be doing fairly well in regard to the wounds on his feet at this point. Fortunately there is no signs of active infection at this time. No fevers, chills, nausea, vomiting, or diarrhea. He currently is seeing infectious disease and still under their care at this point. Subsequently he also has both wounds which she has not been using collagen on as he did not receive that in his packaging he did not call us and let us know that.  Apparently that just was missed on the order. Nonetheless we will get that straightened out today. 8/9-Patient returns for bilateral foot  wounds, using Prisma with hydrogel moistened dressings, and the wounds appear stable. Patient using surgical shoes, avoiding much pressure or weightbearing as much as possible 8/16; patient has bilateral foot wounds. 1 on the right lateral foot proximally the other is on the left mid lateral foot. Both required debridement of callus and thick skin around the wounds. We have been using silver collagen 8/27; patient has bilateral lateral foot wounds. The area on the left substantially surrounded by callus and dry skin. This was removed from the wound edge. The underlying wound is small. The area on the right measured somewhat smaller today. 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 Unfortunately I did not update his antibiotics during today's visit. 9/10 I reviewed Dr. Hale Bogus last notes he felt he had completed antibiotics his inflammatory markers were reasonably well controlled. He has a small wound on the lateral left foot and a tiny area on the right which is just above closed. He is using Hydrofera Blue with border foam he has bilateral surgical shoes 9/24; 2 week f/u. doing well. right foot is closed. left foot still undermined. 10/14; right foot remains closed at the fifth met head. The area over the base of the left fifth metatarsal has a small open area but considerable undermining towards the plantar foot. Thick callus skin around this suggests an adequate pressure relief. We have talked about this. He says he is going to go back into his cam boot. I suggested a total contact cast he did not seem enamored with this suggestion 10/26; left foot base of the fifth metatarsal. Same condition as last time. He has skin over the area with an open wound however the skin is not adherent. He went  to see Dr. Earleen Newport who did an x-ray and culture of his foot I have not reviewed the x-ray but the patient was not told anything. He is on doxycycline 11/11; since the patient was last here he was in the emergency room on 10/30 he was concerned about swelling in the left foot. They did not do any cultures or x-rays. They changed his antibiotics to cephalexin. Previous culture showed group B strep. The cephalexin is appropriate as doxycycline has less than predictable coverage. Arrives in clinic today with swelling over this area under the wound. He also has a new wound on the right fifth metatarsal head 11/18; the patient has a difficult wound on the lateral aspect of the left fifth metatarsal head. The wound was almost ballotable last week I opened it slightly expecting to see purulence however there was just bleeding. I cultured this this was negative. X-ray unchanged. We are trying to get an MRI but I am not sure were going to be able to get this through his insurance. He also has an area on the right lateral fifth metatarsal head this looks healthier 12/3; the patient finally got our MRI. Surprisingly this did not show osteomyelitis. I did show the soft tissue ulceration at the lateral plantar aspect of the fifth metatarsal base with a tiny residual 6 mm abscess overlying the superficial fascia I have tried to culture this area I have not been able to get this to grow anything. Nevertheless the protruding tissue looks aggravated. I suspect we should try to treat the underlying "abscess with broad-spectrum antibiotics. I am going to start him on Levaquin and Flagyl. He has much less edema in his legs and I am going to continue to  wrap his legs and see him weekly 12/10. I started Levaquin and Flagyl on him last week. He just picked up the Flagyl apparently there was some delay. The worry is the wound on the left fifth metatarsal base which is substantial and worsening. His foot looks like he inverts at  the ankle making this a weightbearing surface. Certainly no improvement in fact I think the measurements of this are somewhat worse. We have been using 12/17; he apparently just got the Levaquin yesterday this is 2 weeks after the fact. He has completed the Flagyl. The area over the left fifth metatarsal base still has protruding granulation tissue although it does not look quite as bad as it did some weeks ago. He has severe bilateral lymphedema although we have not been treating him for wounds on his legs this is definitely going to require compression. There was so much edema in the left I did not wish to put him in a total contact cast today. I am going to increase his compression from 3-4 layer. The area on the right lateral fifth met head actually look quite good and superficial. 12/23; patient arrived with callus on the right fifth met head and the substantial hyper granulated callused wound on the base of his fifth metatarsal. He says he is completing his Levaquin in 2 days but I do not think that adds up with what I gave him but I will have to double check this. We are using Hydrofera Blue on both areas. My plan is to put the left leg in a cast the week after New Year's 04/06/2020; patient's wounds about the same. Right lateral fifth metatarsal head and left lateral foot over the base of the fifth metatarsal. There is undermining on the left lateral foot which I removed before application of total contact cast continuing with Hydrofera Blue new. Patient tells me he was seen by endocrinology today lab work was done [Dr. Kerr]. Also wondering whether he was referred to cardiology. I went over some lab work from previously does not have chronic renal failure certainly not nephrotic range proteinuria he does have very poorly controlled diabetes but this is not his most updated lab work. Hemoglobin A1c has been over 11 1/10; the patient had a considerable amount of leakage towards mid part of his  left foot with macerated skin however the wound surface looks better the area on the right lateral fifth met head is better as well. I am going to change the dressing on the left foot under the total contact cast to silver alginate, continue with Hydrofera Blue on the right. 1/20; patient was in the total contact cast for 10 days. Considerable amount of drainage although the skin around the wound does not look too bad on the left foot. The area on the right fifth metatarsal head is closed. Our nursing staff reports large amount of drainage out of the left lateral foot wound 1/25; continues with copious amounts of drainage described by our intake staff. PCR culture I did last week showed E. coli and Enterococcus faecalis and low quantities. Multiple resistance genes documented including extended spectrum beta lactamase, MRSA, MRSE, quinolone, tetracycline. The wound is not quite as good this week as it was 5 days ago but about the same size 2/3; continues with copious amounts of malodorous drainage per our intake nurse. The PCR culture I did 2 weeks ago showed E. coli and low quantities of Enterococcus. There were multiple resistance genes detected. I put Neosporin on  him last week although this does not seem to have helped. The wound is slightly deeper today. Offloading continues to be an issue here although with the amount of drainage she has a total contact cast is just not going to work 2/10; moderate amount of drainage. Patient reports he cannot get his stocking on over the dressing. I told him we have to do that the nurse gave him suggestions on how to make this work. The wound is on the bottom and lateral part of his left foot. Is cultured predominantly grew low amounts of Enterococcus, E. coli and anaerobes. There were multiple resistance genes detected including extended spectrum beta lactamase, quinolone, tetracycline. I could not think of an easy oral combination to address this so for now I  am going to do topical antibiotics provided by [facility] I think the main agents here are vancomycin and an aminoglycoside. We have to be able to give him access to the wounds to get the topical antibiotic on 2/17; moderate amount of drainage this is unchanged. He has his Keystone topical antibiotic against the deep tissue culture organisms. He has been using this and changing the dressing daily. Silver alginate on the wound surface. 2/24; using Keystone antibiotic with silver alginate on the top. He had too much drainage for a total contact cast at one point although I think that is improving and I think in the next week or 2 it might be possible to replace a total contact cast I did not do this today. In general the wound surface looks healthy however he continues to have thick rims of skin and subcutaneous tissue around the wide area of the circumference which I debrided Catherman, [name] ([id number]) [id number] Page 14 of 21 06/04/2020 upon evaluation today patient appears to be doing well in regard to his wound. I do feel like he is showing signs of improvement. There is little bit of callus and dead tissue around the edges of the wound as well as what appears to be a little bit of a sinus tract that is off to the side laterally I would perform debridement to clear that away today. 3/17; left lateral foot. The wound looks about the same as I remember. Not much depth surface looks healthy. No evidence of infection 3/25; left lateral foot. Wound surface looks about the same. Separating epithelium from the circumference. There really is no evidence of infection here however not making progress by my view 3/29; left lateral foot. Surface of the wound again looks reasonably healthy still thick skin and subcutaneous tissue around the wound margins. There is no evidence of infection. One of the concerns being brought up by the nurses has again the amount of drainage vis--vis  continued use of a total contact cast 4/5; left lateral foot at roughly the base of the fifth metatarsal. Nice healthy looking granulated tissue with rims of epithelialization. The overall wound measurements are not any better but the tissue looks healthy. The only concern is the amount of drainage although he has no surrounding maceration with what we have been doing recently to absorb fluid and protect his skin. He also has lymphedema. 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 He states that the kerlix/coban under the TCC  rolled down and could not get it back up. He has been using an offloading scooter and has somehow hurt his right foot using this device. This happened last week. He states that the side of his right foot developed a blister and opened. The top of his foot also has a few small open wounds he thinks is due to his socks rubbing in his shoes. He has not been using any dressings to the wound. He denies purulent drainage, fever/chills or erythema to the wounds. 4/22; patient presents for 1 week follow-up. He developed new wounds to the right foot that were evaluated at last clinic visit. He continues to have a total contact cast to the left leg and he reports no issues. He has been using silver collagen to the right foot wounds with no issues. He denies purulent drainage, fever/chills or erythema to the right foot wounds. He has no complaints today 4/25; patient presents for 1 week follow-up. He has a total contact cast of the left leg and reports no issues. He has been using silver alginate to the right foot wound. He denies purulent drainage, fever/chills or erythema to the right foot wounds. 5/2 patient presents for 1 week follow-up. T  contact cast on the left. The wound which is on the base of the plantar foot at the base of the fifth metatarsal otal actually looks quite good and dimensions continue to gradually contract. HOWEVER the area on the right lateral fifth metatarsal head is much larger than what I remember from 2 weeks ago. Once more is he has significant levels of hypergranulation. Noteworthy that he had this same hyper granulated response on his wound on the left foot at one point in time. So much so that he I thought there was an underlying fluid collection. Based on this I think this just needs debridement. 5/9; the wound on the left actually continues to be gradually smaller with a healthy surface. Slight amount of drainage and maceration of the skin around but not too bad. However he has a large wound over the right fifth metatarsal head very much in the same configuration as his left foot wound was initially. I used silver nitrate to address the hyper granulated tissue no mechanical debridement 5/16; area on the left foot did not look as healthy this week deeper thick surrounding macerated skin and subcutaneous tissue. oo The area on the right foot fifth met head was about the same oo The area on the right ankle that we identified last week is completely broken down into an open wound presumably a stocking rubbing issue 5/23; patient has been using a total contact cast to the left side. He has been using silver alginate underneath. He has also been using silver alginate to the right foot wounds. He has no complaints today. He denies any signs of infection. 5/31; the left-sided wound looks some better measure smaller surface granulation looks better. We have been using silver alginate under the total contact cast oo The large area on his right fifth met head and right dorsal foot look about the same still using silver alginate 6/6; neither side is good as I was hoping although the surface area dimensions are  better. A lot of maceration on his left and right foot around the wound edge. Area on the dorsal right foot looks better. He says he was traveling. I am not sure what does the amount of maceration around the plantar wounds may be drainage issues 6/13; in general the  wound surfaces look quite good on both sides. Macerated skin and raised edges around the wound required debridement although in general especially on the left the surface area seems improved. oo The area on the right dorsal ankle is about the same I thought this would not be such a problem to close 6/20; not much change in either wound although the one on the right looks a little better. Both wounds have thick macerated edges to the skin requiring debridements. We have been using silver alginate. The area on the dorsal right ankle is still open I thought this would be closed. 6/28; patient comes in today with a marked deterioration in the right foot wound fifth met head. Wide area of exposed bone this is a drastic change from last time. The area on the left there we have been casting is stagnant. We have been using silver alginate in both wound areas. 7/5; bone culture I did for PCR last time was positive for Pseudomonas, group B strep, Enterococcus and Staph aureus. There was no suggestion of methicillin resistance or ampicillin resistant genes. This was resistant to tetracycline however He comes into the clinic today with the area over his right plantar fifth metatarsal head which had been doing so well 2 weeks ago completely necrotic feeling bone. I do not know that this is going to be salvageable. The left foot wound is certainly no smaller but it has a better surface and is superficial. 7/8; patient called in this morning to say that his total contact cast was rubbing against his foot. He states he is doing fine overall. He denies signs of infection. 7/12; continued deterioration in the wound over the right fifth metatarsal head  crumbling bone. This is not going to be salvageable. The patient agrees and wants to be referred to Dr. Doran Durand which we will attempt to arrange as soon as possible. I am going to continue him on antibiotics as long as that takes so I will renew those today. The area on the left foot which is the base of the fifth metatarsal continues to look somewhat better. Healthy looking tissue no depth no debridement is necessary here. 7/20; the patient was kindly seen by Dr. Doran Durand of orthopedics on 10/19/2020. 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 Towards this end we have taken him out of the total contact cast on the left we will put him in a wrap with Hydrofera Blue. As I understand things surgery is planned for 7/21 7/27; patient had his surgery last Thursday. 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 Apparently everything went well we did not still disturb that today The area on the left foot actually looks quite good. He has been much less mobile which probably explains this he did not seem to do well in the total contact cast secondary to drainage and maceration I think. 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 Fortunately there is no evidence of active infection at this time. No fevers, chills, nausea, vomiting, or diarrhea. Overall I think that he is actually doing extremely well. Nonetheless I do believe that he is staying off of this more following the surgery in his right foot that is the reason the left is doing so great. 8/16; left plantar foot wound. This  looks smaller than the last time I saw this he is using Hydrofera Blue. The surgical wound on the right foot is being followed by Dr. Doran Durand we did not look at this today. He has surgical shoes on both feet 8/23; left plantar foot wound not  as good this week. Surrounding macerated skin and subcutaneous tissue everything looks moist and wet. I do not think he is offloading this adequately. He is using a surgical shoe Apparently the right foot surgical wound is not open although I did not check his foot Mendizabal, [name] ([id number]) [id number] Page 15 of 21 8/31; left plantar foot lateral aspect. Much improved this week. He has no maceration. Some improvement in the surface area of the wound but most impressively the depth is come in we are using silver alginate. The patient is a Product/process development scientist. He is asked that we write him a letter so he can go back to work. I have also tried to see if we can write something that will allow him to limit the amount of time that he is on his foot at work. 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 Hopefully the latter can be avoided 9/6; I believe the patient missed an appointment last week. He arrives in today with a wound looking roughly the same certainly no better. Undermining laterally and also inferiorly. We used molecuLight today in training with the patient's permission.. We are using silver alginate 9/21 wound is measuring bigger this week although this may have to do with the aggressive circumferential debridement last week in response to the blush fluorescence on the MolecuLight. Culture I did last week showed significant MSSA and E. coli. I put him on Augmentin but he has not started it yet. We are also going to send this for compounded antibiotics at [facility]. There is no evidence of systemic infection 9/29; silver alginate. His Keystone arrived. He is completing Augmentin in 2 days. Offloading in a cam boot. Moderate drainage per our intake staff 10/5; using silver alginate. He has been using his [place]. He has completed his Augmentin. Per our intake nurse still a lot of  drainage, far too much to consider a total contact cast. Wound measures about the same. He had the same undermining area that I defined last week from a roughly 11-3. I remove this today 10/12; using silver alginate he is using the [place]. He comes in for a nurse visit hence we are applying [employer] twice a week. Measuring slightly better today and less notable drainage. Extensive debridement of the wound edge last time 10/18; using topical Keystone and silver alginate and a soft cast. Wound measurements about the same. Drainage was through his soft cast. We are changing this twice a week Tuesdays and Friday 10/25; comes in with moderate drainage. Still using Keystone silver alginate and a soft cast. Wound dimensions completely the same.He has a lot of edema in the left leg he has lymphedema. Asking for [name] to consider wrapping him as he cannot get his stocking on over the soft cast 11/2; comes in with moderate to large drainage slightly smaller in terms of width we have been using [place]. His wound looks satisfactory but not much improvement 11/4; patient presents today for obligatory cast change. Has no issues or complaints today. He denies signs of infection. 11/9; patient traveled this weekend to [place], was on the cast quite a bit. Staining of  the cast with black material from his walking boot. Drainage was not quite as bad as we feared. Using silver alginate and Keystone 11/16; we do not have size for cast therefore we have been putting a soft cast on him since the change on Friday. Still a significant amount of drainage necessitating changing twice a week. We have been using the Keystone at cast changes either hard or soft as well as silver alginate Comes in the clinic with things actually looking fairly good improvement in width. He says his offloading is about the same 02/24/2021 upon evaluation today patient actually comes back in and is doing excellent in regard to his foot ulcer this is  significantly smaller even compared to the last visit. The soft cast seems to have done extremely well for him which is great news. I do not see any signs of infection minimal debridement will be needed today. 11/30; left lateral foot much improved half a centimeter improvement in surface area. No evidence of infection. He seems to be doing better with the soft cast in the TCC therefore we will continue with this. He comes back in later in the week for a change with the nurses. This is due to drainage 12/6; no improvement in dimensions. Under illumination some debris on the surface we have been using silver alginate, soft cast. If there is anything optimistic here he seems to have have less drainage 12/13. Dimensions are improved both length and width and slightly in depth. Appears to be quite healthy today. Raised edges of this thick skin and callus around the edges however. He is in a soft cast were bringing him back once for a change on Friday. Drainage is better 12/20. Dimensions are improved. He still has raised edges of thick skin and callus around the edges. We are using a soft cast 12/28; comes in today with thick callus around the wound. Using silver under alginate under a soft cast. I do not think there is much improvement in any measurement 2023 04/06/2021; patient was put in a total contact cast. Unfortunately not much change in surface area 1/10; not much different still thick callus and skin around the edge in spite of the total contact cast. This was just debrided last week we have been using the [facility] compounded antibiotic and silver alginate under a total contact cast 1/18 the patient's wound on the left side is doing nicely. smaller HOWEVER he comes in today with a wound on the right foot laterally. blister most likely serosangquenous drainage 1/24; the patient continues to do well in terms of the plantar left foot which is continued to contract using silver alginate under the  total contact cast HOWEVER the right lateral foot is bigger with denuded skin around the edges. I used pickups and a #15 scalpel to remove this this looks like the remanence of a large blister. Cannot rule out infection. Culture in this area I did last week showed Staphylococcus lugdunensis few colonies. I am going to try to address this with his [employer] antibiotic that is done so well on the left having linezolid and this should cover the staph 2/1; the patient's wound on his left foot which was the original plantar foot wound thick skin and eschar around the edges even in the total contact cast but the wound surface does not look too bad The real problem is on how his right lateral foot at roughly the base of the fifth metatarsal. The wound is completely necrotic more worrisome than  that there is swelling around the edges of this. We have been using silver alginate on both wounds and Keystone on the right foot. Unfortunately I think he is going to require systemic antibiotics while we await cultures. He did not get the x-ray done that we ordered last week [lost the prescription 2/7; disappointingly in the area on the left foot which we are treating with a total contact cast is still not closed although it is much smaller. He continues to have a lot of callus around the wound edge. -Right lateral foot culture I did last week was negative x-ray also negative for osteomyelitis. 2/15: TCC silver alginate on the left and silver alginate on the right lateral. No real improvement in either area 05/26/2021: T oday, the wounds are roughly the same size as at his previous visit, post-debridement. He continues to endorse fairly substantial drainage, particularly on the right. He has been in a total contact cast on the left. There is still some callus surrounding this lesion. On the right, the periwound skin is quite macerated, along with surrounding callus. The center of the right-sided wound also has some  dark, densely adherent material, which is very difficult to remove. 06/02/2021: Today, both wounds are slightly smaller. He has been using zinc oxide ointment around the right ulcer and the degree of maceration has improved markedly. There continues to be an area of nonviable tissue in the center of the right sided ulcer. The left-sided wound, which has been in the total contact cast. Appears clean and the degree of callus around it is less than previously. 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 He had to take the stairs and both wounds have increased in size. The left foot, which has been in a total contact cast, has developed a tunnel tracking to the lateral aspect of his foot. The nonviable tissue in the center of the right-sided ulcer remains recalcitrant to debridement. There is significant undermining surrounding the entirety of the left sided wound. 06/16/2021: The elevator at school has been fixed and the patient has been able to avoid putting as much weight on his wounds over the past week. We converted the left foot wound into a single lesion today, but despite this, the wound is actually smaller. The base is healthy with limited periwound callus. On the right, the central necrotic area is still present. He continues to be quite macerated around the right sided wound, despite applying barrier cream. This does, however, have the benefit of softening the callus to make it more easily removable. Erven, [name] ([id number]) [id number] Page 16 of 21 06/23/2021: Today, the left wound is smaller. The lateral aspect that had opened up previously is now closed. The wound base has a healthy bed of granulation tissue and minimal slough. Unfortunately, on the right, the wound is larger and continues to be fairly macerated. He has also reopened the wound at his right ankle. He thinks this is due to the gait he  has adopted secondary to his total contact cast and boot. 06/30/2021: T oday, both wounds are a little bit larger. The lateral aspect on the left has remained closed. He continues to have significant periwound maceration. The culture that I took from the right sided wound grew a population of bacteria that is not covered by his current [facility] antibiotic. The center of the right- sided wound continues to appear necrotic with nonviable fat. It probes deeper today, but does  not reach bone. 07/07/2021: The periwound maceration is a little bit less today. The right lateral foot wound has some areas that appear more viable and the necrotic center also looks a little bit better. The wound on the dorsal surface of his right foot near the ankle is contracting and the surface appears healthy. The left plantar wound surface looks healthy, but there is some new undermining on the medial portion. He did get his new Keystone antibiotic and began applying that to the right foot wound on Saturday. 07/14/2021: The intake nurse reported substantial drainage from his wounds, but the periwound skin actually looks better than is typical for him. The wound on the dorsal surface of his right foot near the ankle is smaller and just has a small open area underneath some dried eschar. The left plantar wound surface looks healthy and there has been no significant accumulation of callus. The right lateral foot wound looks quite a bit better, with the central portion, which typically appears necrotic, looking more viable albeit pale. 07/22/2021: His left foot is extremely macerated today. The wound is about the same size. The wound on the dorsal surface of his right foot near the ankle had closed, but he traumatized it removing the dressing and there is a tiny skin tear in that location. The right lateral foot wound is bigger, but the surface appears healthy. 07/30/2021: The wound on the dorsal surface of his right foot near the  ankle is closed. The right lateral foot wound again is a little bit bigger due to some undermining. The periwound skin is in better condition, however. He has been applying zinc oxide. The wound surface is a little bit dry today. On the left, he does not have the substantial maceration that we frequently see. The wound itself is smaller and has a clean surface. 08/06/2021: Both wounds seem to have deteriorated over the past week. The right lateral foot wound has a dry surface but the periwound is boggy.. Overall wound dimensions are about the same. On the left, the wound is about the same size, but there is more undermining present underneath periwound callus. 08/13/2021: The right sided wound looks about the same, but on the left there has been substantial deterioration. The undermining continues to extend under periwound callus. Once this was removed, substantial extension of the wound was present. There is no odor or purulent drainage but clearly the wounds have broken down. 08/20/2021: The wounds look about the same today. He has been out of his total contact cast and has just been changing the dressings using topical Keystone with PolyMem Ag, Kerlix and Ace bandages. The wound on the top of his right ankle has reopened but this is quite small. There was a little bit of purulent material that I expressed when examining this wound. 08/24/2021: After the aggressive debridement I performed at his last visit, the wounds actually look a little bit better today. They are smaller with the exception of the wound on the top of his right ankle which is a little bit bigger as some more skin pulled off when he was changing his dressing. We are using topical Keystone with PolyMem Ag Kerlix and Ace bandages. 09/02/2021: There has been really no change to any of his wounds. 09/16/2021: The patient was hospitalized last week with nausea, vomiting, and dehydration. He says that while he was in the hospital, his wounds  were not really addressed properly. T oday, both plantar foot wounds are larger and the periwound  skin is macerated. The wound on the dorsum of his right foot has a scab on the top. The right foot now has a crater where previously he had had nonviable fat. It looks as though this simply died and fell out. The periwound callus is wet. 09/24/2021: His wounds have deteriorated somewhat since his last visit. The wound on the dorsum of his right foot near his ankle is larger and has more nonviable tissue present. The crater in his right foot is even deeper; I cannot quite palpate or probe to bone but I am sure it is close. The wound on his left plantar foot has an odd boggy area in the center that almost feels as though it has fluid within it. He has run out of his topical Keystone antibiotic. We are using silver alginate on his wounds. 09/29/2021: He has developed a new wound on the dorsum of his left foot near his ankle. He says he thinks his wrapping is rubbing in that site. I would concur with this as the wound on his right ankle is larger. The left foot looks about the same. The right foot has the crater that was present last week. No significant slough accumulation, but his foot remains quite swollen and warm despite oral antibiotic therapy. 10/08/2021: All of his wounds look about the same as last week. He did not start his oral antibiotics that are prescribed until just a couple of days ago; his [employer] compounded antibiotics formula has been changed and he is awaiting delivery of the new recipe. His MRI that was scheduled for earlier this week was canceled as no prior authorization had been obtained; unfortunately the tech responsible sent an email to my old [place] email, which I no longer use nor have access to. 10/18/2021: The wounds on his bilateral dorsal feet near the ankles are both improved. They are smaller and have just some eschar and slough buildup. The left plantar wound has a fair  amount of undermining, but the surface is clean. There is some periwound callus accumulation. On the right plantar foot, there is nonviable fat leading to a deep tunnel that tracks towards his dorsal medial foot. There is periwound callus and slough accumulation, as well. His right foot and leg remain swollen as compared to the left. 10/25/2021: The wounds on his bilateral dorsal feet and at the ankles have broken down somewhat. They are little bit larger than last week. The left plantar wound continues to undermine laterally but the surface is clean. The right plantar foot wound shows some decreased depth in the tunnel tracking towards his dorsal medial foot. He has not yet had the Doppler study that I ordered; it sounds like there is some confusion about the scheduling of the procedure. In addition, the MRI was denied and I have taken steps to appeal the denial. 11/24/2021: Since our last visit, Mr. Ragsdale was admitted to the hospital where an MRI suggested osteomyelitis. He was taken the operating room by podiatry. Bone biopsies were negative for osteomyelitis. They debrided his wounds and applied myriad matrix. He saw them last week and they removed his staples. He is here today to continue his wound healing process. T oday, both of the dorsal foot/ankle wounds are substantially smaller. There is just a little eschar overlying the left sided wound and some eschar and slough on the right. The right plantar foot ulcer has the healthiest surface of granulation tissue that I have seen to date. A portion of the myriad  matrix failed to take and was hanging loose. It appears that myriad morcells were placed into the tunnel closest to the dorsal portion of his foot. These have sloughed off. The left plantar foot ulcer is about the same size, but has a much healthier surface than in the past. Both plantar ulcers have callus and slough accumulation. 12/07/2021: Left dorsal foot/ankle wound is closed. The  right dorsal foot/ankle wound is nearly closed and just has a small open area with some eschar and slough. The right plantar foot wound has contracted quite a bit since our last visit. It has a healthy surface with just a little bit of slough accumulation and periwound callus buildup. The left plantar foot wound is about the same size but the surface appears healthy. There is a little slough and periwound callus on this side, as well. 12/15/2021: Both dorsal foot/ankle wounds are closed. The right plantar foot wound is substantially smaller than at our last visit. The tunneling that was present has nearly closed. There is just a little bit of slough buildup. The left plantar foot wound is also a little bit smaller today. The surface is the healthiest that I have ever seen it. Light slough and periwound callus accumulation on this side. 12/22/2021: The dorsal ankle/foot wounds remain closed. The right plantar foot wound continues to contract. There is still a bit of depth at the lateral portion of the wound but the surface has a good granulated appearance. The wound on the left is about the same size, the central indented portion is still adherent. It also is clean with good granulation tissue present. The periwound skin and callus are a bit macerated, however. Birkel, [name] ([id number]) [id number] Page 17 of 21 12/29/2021: Both ankle wounds remain closed. The right plantar foot wound is less than half the size that it was last week. There is no depth any longer and the surface has just a little bit of slough and periwound callus. On the left, the wound is not much smaller, but the surface is healthier with good granulation tissue. There is also a little slough and periwound callus buildup. 01/05/2022: The right plantar foot wound continues to contract and is once again about half the size as it was last week. There is just a little bit of surface slough and periwound callus.  On the left, the depth of the wound has decreased and the diameter has started to contract. It is also very clean with just a little slough and biofilm present. 01/12/2022: The right plantar foot wound is smaller again today. There is just some periwound callus and slough accumulation. On the left, there is a fair amount of undermining and the surface is a little boggy, but no other significant change. 01/19/2022: The right plantar wound continues to contract. There is minimal periwound callus accumulation and just a light layer of slough on the surface. On the left, the surface looks better, but there is fairly significant undermining near the 11:00 portion of the wound, aiming towards his great toe. The skin overlying this area is very healthy and has a good fat layer present. No malodor or purulent drainage. 01/26/2022: The right sided wound continues to contract and has just a light layer of slough on the surface. Minimal periwound callus. On the left, he still has substantial undermining and the tissue surface is not as robust as I would like to see. No malodor or purulent drainage, but he did say he had a lot of serous drainage over  the weekend. 02/02/2022: The right foot wound is about a quarter of the size that it was at his last visit. There is just a little slough on the surface and some periwound callus. On the left, the undermining persists and the tissue is still more pale, but he does not seem to have had as much drainage over the last weekend. We are waiting on a wound VAC. 02/09/2022: His right foot has healed. He received the wound VAC, but did not bring it to clinic with him today. The lateral aspect of the left foot wound has come in a little, but he still has substantial undermining on the medial and distal portion of the wound. Still with macerated periwound callus. 02/16/2022: The wound VAC was applied last Friday. T oday, there has been tremendous improvement in the surface of the  wound bed. The undermined portion of the wound has come in a bit. There is still some overhanging dead skin. Overall the wound looks much better. 02/23/2022: No significant change in the overall wound dimensions, but the wound surface continues to improve and appears healthier and more robust. There is some periwound callus accumulation and overhanging skin. No concern for infection. 03/02/2022: Although the measurements taken in clinic today were unchanged, the visual appearance of the wound looks like it is smaller and the undermining/tunneling is filling with flesh. There is less periwound tissue maceration than usual. No malodor or purulent drainage. No concern for infection. 03/09/2022: The undermining has come in by couple of millimeters. The periwound is a little bit more macerated this week. No concern for infection. 12/13; substantial wound on the left plantar foot. We are using silver collagen and a wound VAC. 03/23/2022: The undermining continues to contract. The wound surface is a little bit dry. Patient History Information obtained from Patient. Family History No family history of Cancer, Diabetes, Hereditary Spherocytosis, Hypertension, Kidney Disease, Lung Disease, Seizures, Stroke, Thyroid Problems, Tuberculosis. Social History Never smoker, Marital Status - Single, Alcohol Use - Rarely, Drug Use - No History, Caffeine Use - Never. Medical History Eyes Denies history of Cataracts, Glaucoma, Optic Neuritis Ear/Nose/Mouth/Throat Denies history of Chronic sinus problems/congestion, Middle ear problems Hematologic/Lymphatic Denies history of Anemia, Hemophilia, Human Immunodeficiency Virus, Lymphedema, Sickle Cell Disease Respiratory Denies history of Aspiration, Asthma, Chronic Obstructive Pulmonary Disease (COPD), Pneumothorax, Sleep Apnea, Tuberculosis Cardiovascular Denies history of Angina, Arrhythmia, Congestive Heart Failure, Coronary Artery Disease, Deep Vein Thrombosis,  Hypertension, Hypotension, Myocardial Infarction, Peripheral Arterial Disease, Peripheral Venous Disease, Phlebitis, Vasculitis Gastrointestinal Denies history of Cirrhosis , Colitis, Crohnoos, Hepatitis A, Hepatitis B, Hepatitis C Endocrine Patient has history of Type II Diabetes Denies history of Type I Diabetes Immunological Denies history of Lupus Erythematosus, Raynaudoos, Scleroderma Integumentary (Skin) Denies history of History of Burn Musculoskeletal Denies history of Gout, Rheumatoid Arthritis, Osteoarthritis, Osteomyelitis Neurologic Denies history of Dementia, Neuropathy, Quadriplegia, Paraplegia, Seizure Disorder Oncologic Denies history of Received Chemotherapy, Received Radiation Psychiatric Denies history of Anorexia/bulimia, Confinement Anxiety Hospitalization/Surgery History - 11/1-11/06/2018- sepsis foot infection. - 11/4-11/5 02 sats low respiratory distress. Maceachern, [name] ([id number]) [id number] Page 18 of 21 Objective Constitutional No acute distress. Vitals Time Taken: 8:37 AM, Height: 77 in, Weight: 280 lbs, BMI: 33.2, Temperature: 98.5 F, Pulse: 89 bpm, Respiratory Rate: 16 breaths/min, Blood Pressure: 137/87 mmHg, Capillary Blood Glucose: 89 mg/dl. Respiratory Normal work of breathing on room air. General Notes: 03/23/2022: The undermining continues to contract. The wound surface is a little bit dry. Integumentary (Hair, Skin) Wound #3 status is Open. Original cause of  wound was Trauma. The date acquired was: 10/02/2019. The wound has been in treatment 125 weeks. The wound is located on the [place]. The wound measures 3cm length x 3.5cm width x 0.8cm depth; 8.247cm^2 area and 6.597cm^3 volume. There is Fat Layer (Subcutaneous Tissue) exposed. There is no tunneling noted, however, there is undermining starting at 12:00 and ending at 6:00 with a maximum distance of 1.3cm. There is a medium amount of  serosanguineous drainage noted. The wound margin is distinct with the outline attached to the wound base. There is large (67-100%) red granulation within the wound bed. There is a small (1-33%) amount of necrotic tissue within the wound bed including Adherent Slough. The periwound skin appearance had no abnormalities noted for color. The periwound skin appearance exhibited: Callus, Maceration. Periwound temperature was noted as No Abnormality. 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 There was a Selective/Open Wound Skin/Epidermis Debridement with a total area of 10.5 sq cm performed by Max Maudlin, MD. With the following instrument(s): Curette to remove Non-Viable tissue/material. Material removed includes Callus, Slough, and Skin: Epidermis after achieving pain control using Lidocaine 5% topical ointment. No specimens were taken. A time out was conducted at 08:50, prior to the start of the procedure. A Minimum amount of bleeding was controlled with Pressure. The procedure was tolerated well. Post Debridement Measurements: 3cm length x 3.5cm width x 0.8cm depth; 6.597cm^3 volume. Character of Wound/Ulcer Post Debridement requires further debridement. Severity of Tissue Post Debridement is: Fat layer exposed. Post procedure Diagnosis Wound #3: Same as Pre-Procedure General Notes: Scribed for Dr. Celine [name] by Max East, RN. Plan Follow-up Appointments: Return Appointment in 1 week. - Dr. Celine [name] - Room 1 with [facility] Shower/ Hygiene: May shower and wash wound with soap and water. Negative Presssure Wound Therapy: Wound #3 Left,Lateral,Plantar Foot: Wound Vac to  wound continuously at 128m/hg pressure Black Foam Edema Control - Lymphedema / SCD / Other: Elevate legs to the level of the heart or above for 30 minutes daily and/or when sitting, a frequency of: - throughout the day Avoid standing for long periods of time. Exercise regularly Compression stocking or Garment 20-30 mm/Hg pressure to: - to both legs daily Off-Loading: Open toe surgical shoe to: - Both feet Additional Orders / Instructions: Follow Nutritious Diet Non Wound Condition: Apply the following to affected area as directed: - continue to apply foam donut or cushion to healed right plantar foot WOUND #3: - Foot Wound Laterality: Plantar, Left, Lateral Cleanser: Soap and Water 3 x Per Week/30 Days Discharge Instructions: May shower and wash wound with dial antibacterial soap and water prior to dressing change. Peri-Wound Care: Skin Prep 3 x Per Week/30 Days Discharge Instructions: Use skin prep as directed Prim Dressing: Promogran Prisma Matrix, 4.34 (sq in) (silver collagen) 3 x Per Week/30 Days ary Discharge Instructions: Moisten collagen with saline or hydrogel Smeal, CMali([id number] Page 19 of 21 Prim Dressing: VAC 3 x Per Week/30 Days ary Com pression Wrap: Kerlix Roll 4.5x3.1 (in/yd) (Generic) 3 x Per Week/30 Days Discharge Instructions: Apply Kerlix and Coban compression as directed. 03/23/2022: The undermining continues to contract. The wound surface is a little bit dry. I used a curette to debride epidermis and callus  as well as slough from the wound. We will continue Prisma silver collagen and negative pressure wound therapy. Follow-up in 1 week. Electronic Signature(s) Signed: 03/23/2022 8:59:06 AM By: Max Maudlin MD FACS Entered By: [name] on 03/23/2022 08:59:05 -------------------------------------------------------------------------------- HxROS Details Patient Name: Date of Service: [name], [name]  03/23/2022 8:15 A M Medical Record Number: [id number] Patient Account Number: [id number] Date of Birth/Sex: Treating RN: 1985/04/16 (36 y.o. M) Primary Care Provider: [name] Other Clinician: Referring Provider: Treating Provider/Extender: [name] in Treatment: 125 Information Obtained From Patient Eyes Medical History: Negative for: Cataracts; Glaucoma; Optic Neuritis Ear/Nose/Mouth/Throat Medical History: Negative for: Chronic sinus problems/congestion; Middle ear problems Hematologic/Lymphatic Medical History: Negative for: Anemia; Hemophilia; Human Immunodeficiency Virus; Lymphedema; Sickle Cell Disease Respiratory Medical History: Negative for: Aspiration; Asthma; Chronic Obstructive Pulmonary Disease (COPD); Pneumothorax; Sleep Apnea; Tuberculosis Cardiovascular Medical History: Negative for: Angina; Arrhythmia; Congestive Heart Failure; Coronary Artery Disease; Deep Vein Thrombosis; Hypertension; Hypotension; Myocardial Infarction; Peripheral Arterial Disease; Peripheral Venous Disease; Phlebitis; Vasculitis Gastrointestinal Medical History: Negative for: Cirrhosis ; Colitis; Crohns; Hepatitis A; Hepatitis B; Hepatitis C Endocrine Medical History: Positive for: Type II Diabetes Negative for: Type I Diabetes Time with diabetes: 8 Treated with: Insulin Blood sugar tested every day: No Lick, [name] ([id number]) [id number] Page 20 of 21 Immunological Medical History: Negative for: Lupus Erythematosus; Raynauds; Scleroderma Integumentary (Skin) Medical History: Negative for: History of Burn Musculoskeletal Medical History: Negative for: Gout; Rheumatoid Arthritis; Osteoarthritis; Osteomyelitis Neurologic Medical History: Negative for: Dementia; Neuropathy; Quadriplegia; Paraplegia; Seizure Disorder Oncologic Medical History: Negative for: Received Chemotherapy; Received  Radiation Psychiatric Medical History: Negative for: Anorexia/bulimia; Confinement Anxiety Immunizations Pneumococcal Vaccine: Received Pneumococcal Vaccination: No Implantable Devices None Hospitalization / Surgery History Type of Hospitalization/Surgery 11/1-11/06/2018- sepsis foot infection 11/4-11/5 02 sats low respiratory distress Family and Social History Cancer: No; Diabetes: No; Hereditary Spherocytosis: No; Hypertension: No; Kidney Disease: No; Lung Disease: No; Seizures: No; Stroke: No; Thyroid Problems: No; Tuberculosis: No; Never smoker; Marital Status - Single; Alcohol Use: Rarely; Drug Use: No History; Caffeine Use: Never; Financial Concerns: No; Food, Clothing or Shelter Needs: No; Support System Lacking: No; Transportation Concerns: No Electronic Signature(s) Signed: 03/23/2022 9:12:08 AM By: Max Maudlin MD FACS Entered By: [name] on 03/23/2022 08:57:33 -------------------------------------------------------------------------------- SuperBill Details Patient Name: Date of Service: [name], [name] 03/23/2022 Medical Record Number: [id number] Patient Account Number: [id number] Date of Birth/Sex: Treating RN: 05/20/1985 (36 y.o. M) Primary Care Provider: [name] Other Clinician: Referring Provider: Treating Provider/Extender: [name] in Treatment: 125 Diagnosis Coding ICD-10 Codes Code Description E11.621 Type 2 diabetes mellitus with foot ulcer Hoeschen, [name] ([id number]) [id number] Page 21 of 21 L97.528 Non-pressure chronic ulcer of other part of left foot with other specified severity Facility Procedures : CPT4 Code: 62952841 Description: 32440 - DEBRIDE WOUND 1ST 20 SQ CM OR < ICD-10 Diagnosis Description L97.528 Non-pressure chronic ulcer of other part of left foot with other specified severi Modifier: ty Quantity: 1 : CPT4 Code: 10272536 Description: 64403 - WOUND VAC-50  SQ CM OR LESS Modifier: Quantity: 1 Physician Procedures : CPT4 Code Description Modifier [id number] - WC PHYS LEVEL 4 - EST PT 25 ICD-10 Diagnosis Description L97.528 Non-pressure chronic ulcer of other part of left foot with other specified severity E11.621 Type 2 diabetes mellitus with foot ulcer Quantity: 1 : [id number] - WC PHYS DEBR WO ANESTH 20 SQ CM ICD-10 Diagnosis Description L97.528 Non-pressure chronic ulcer of other part of left foot with other  specified severity Quantity: 1 Electronic Signature(s) Signed: 03/23/2022 8:59:23 AM By: Max Maudlin MD FACS Entered By: [name] on 03/23/2022 08:59:22

## 2022-03-30 ENCOUNTER — Encounter (HOSPITAL_BASED_OUTPATIENT_CLINIC_OR_DEPARTMENT_OTHER): Payer: BC Managed Care – PPO | Admitting: General Surgery

## 2022-03-30 DIAGNOSIS — E11621 Type 2 diabetes mellitus with foot ulcer: Secondary | ICD-10-CM | POA: Diagnosis not present

## 2022-03-30 NOTE — Progress Notes (Addendum)
[name], Max ([id number]) [id number] Page 1 of 20 Visit Report for 03/30/2022 Chief Complaint Document Details Patient Name: Date of Service: [name] 03/30/2022 8:15 A M Medical Record Number: [id number] Patient Account Number: [id number] Date of Birth/Sex: Treating RN: Apr 08, 1985 (36 y.o. M) Primary Care Provider: Seward [name] Other Clinician: Referring Provider: Treating Provider/Extender: [name] in Treatment: 126 Information Obtained from: Patient Chief Complaint 01/11/2019; patient is here for review of a rather substantial wound over the left fifth plantar metatarsal head extending into the lateral part of his foot 10/24/2019; patient returns to clinic with wounds on his bilateral feet with underlying osteomyelitis biopsy-proven Electronic Signature(s) Signed: 03/30/2022 8:51:30 AM By: Max Maudlin MD FACS Entered By: [name] on 03/30/2022 08:51:30 -------------------------------------------------------------------------------- HPI Details Patient Name: Date of Service: [name], [name] 03/30/2022 8:15 A M Medical Record Number: [id number] Patient Account Number: [id number] Date of Birth/Sex: Treating RN: 1985/11/18 (36 y.o. 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 He comes in for review of a wound over the plantar fifth metatarsal head extending into the lateral part of the foot. He was followed for this previously by his podiatrist Dr. Cornelius [name]. As the patient tells his story he went to see podiatry first for a swelling he developed on the lateral part of his fifth metatarsal head in May. He states this was "open" by podiatry  and the area closed. He was followed up in June and it was again opened callus removed and it closed promptly. There were plans being made for surgery on the fifth metatarsal head in June however his blood sugar was apparently too high for anesthesia. Apparently the area was debrided and opened again in June and it is never closed since. Looking over the records from podiatry I am really not able to follow this. It was clear when he was first seen it was before 5/14 at that point he already had a wound. By 5/17 the ulcer was resolved. I do not see anything about a procedure. On 5/28 noted to have pre-ulcerative moderate keratosis. X-ray noted 1/5 contracted toe and tailor's bunion and metatarsal deformity. On a visit date on 09/28/2018 the dorsal part of the left foot it healed and resolved. There was concern about swelling in his lower extremity he was sent to the ER.. As far as I can tell he was seen in the ER on 7/12 with an ulcer on his left foot. A DVT rule out of the left leg was negative. I do not think I have complete records from podiatry but I am not able to verify the procedures this patient states he had. He states after the last procedure the wound has never closed although I am not able to follow this in the records I have from podiatry. He has not had a recent x-ray The patient has been using Neosporin on the wound. He is wearing a Darco shoe. He is still very active up on his foot working and exercising. Past medical history; type 2 diabetes ketosis-prone, leg swelling with a negative DVT study in July. Non-smoker ABI in our clinic was 0.85 on the left 10/16; substantial wound on the plantar left fifth met head extending laterally almost to the dorsal fifth MTP. We have been using silver  alginate we gave him a Darco forefoot off loader. An x-ray did not show evidence of osteomyelitis did note soft tissue emphysema which I think was due to gas tracking through an open wound. There is no  doubt in my mind he requires an MRI 10/23; MRI not booked until 3 November at the earliest this is largely due to his glucose sensor in the right arm. We have been using silver alginate. There has been an improvement 10/29; I am still not exactly sure when his MRI is booked for. He says it is the third but it is the 10th in epic. This definitely needs to be done. He is running a low-grade fever today but no other symptoms. No real improvement in the 1 [name], Max ([id number]) [id number] Page 2 of 20 02/26/2019 patient presents today for a follow-up visit here in our clinic he is last been seen in the clinic on October 29. Subsequently we were working on getting MRI to evaluate and see what exactly was going on and where we would need to go from the standpoint of whether or not he had osteomyelitis and again what treatments were going be required. Subsequently the patient ended up being admitted to the hospital on 02/07/2019 and was discharged on 02/14/2019. This is a somewhat interesting admission with a discharge diagnosis of pneumonia due to COVID-19 although he was positive for COVID-19 when tested at the urgent care but negative x2 when he was actually in the hospital. With that being said he did have acute respiratory failure with hypoxia and it was noted he also have a left foot ulceration with osteomyelitis. With that being said he did require oxygen for his pneumonia and I level 4 L. He was placed on antivirals and steroids for the COVID-19. He was also transferred to the [facility] at one point. Nonetheless he did subsequently discharged home and since being home has done much better in that regard. The CT angiogram did not show any pulmonary embolism. With regard to the osteomyelitis the patient was placed on vancomycin and Zosyn while in the hospital but has been changed to Augmentin at discharge. It was also recommended that he follow- up with wound  care and podiatry. Podiatry however wanted him to see [name] according to the patient prior to them doing anything further. His hemoglobin A1c was 9.9 as noted in the hospital. Have an MRI of the left foot performed while in the hospital on 02/04/2019. This showed evidence of septic arthritis at the fifth MTP joint and osteomyelitis involving the fifth metatarsal head and proximal phalanx. There is an overlying plantar open wound noted an abscess tracking back along the lateral aspect of the fifth metatarsal shaft. There is otherwise diffuse cellulitis and mild fasciitis without findings of polymyositis. The patient did have recently pneumonia secondary to COVID-19 I looked in the chart through epic and it does appear that the patient may need to have an additional x-ray just to ensure everything is cleared and that he has no airspace disease prior to putting him into the [name]. 03/05/2019; patient was readmitted to the clinic last week. He was hospitalized twice for a viral upper respiratory tract infection from 11/1 through 11/4 and then 11/5 through 11/12 ultimately this turned out to be Covid pneumonitis. Although he was discharged on oxygen he is not using it. He says he feels fine. He has no exercise limitation no cough no sputum. His O2 sat in our clinic today was 100% on room  air. 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 He received Vanco and Zosyn in the hospital and then was discharged on 2 weeks of Augmentin. I do not see any relevant cultures. He was supposed to follow-up with infectious disease but I do not see that he has an appointment. 12/8; patient saw Dr. Novella Olive of infectious disease last week. He felt that he had had adequate antibiotic therapy. He did not go to follow-up with Dr. Amalia Hailey of podiatry and I have again talked to him about the pros and cons of this. He does not  want to consider a ray amputation of this time. He is aware of the risks of recurrence, migration etc. He started HBO today and tolerated this well. He can complete the Augmentin that I gave him last week. I have looked over the lab work that Dr. Chana Bode ordered his C-reactive protein was 3.3 and his sedimentation rate was 17. The C-reactive protein is never really been measurably that high in this patient 12/15; not much change in the wound today however he has undermining along the lateral part of the foot again more extensively than last week. He has some rims of epithelialization. We have been using silver alginate. He is undergoing hyperbarics but did not dive today 12/18; in for his obligatory first total contact cast change. Unfortunately there was pus coming from the undermining area around his fifth metatarsal head. This was cultured but will preclude reapplication of a cast. He is seen in conjunction with HBO 12/24; patient had staph lugdunensis in the wound in the undermining area laterally last time. We put him on doxycycline which should have covered this. The wound looks better today. I am going to give him another week of doxycycline before reattempting the total contact cast 12/31; the patient is completing antibiotics. Hemorrhagic debris in the distal part of the wound with some undermining distally. He also had hyper granulation. Extensive debridement with a #5 curette. The infected area that was on the lateral part of the fifth met head is closed over. I do not think he needs any more antibiotics. Patient was seen prior to HBO. Preparations for a total contact cast were made in the cast will be placed post hyperbarics 04/11/19; once again the patient arrives today without complaint. He had been in a cast all week noted that he had heavy drainage this week. This resulted in large raised areas of macerated tissue around the wound 1/14; wound bed looks better slightly smaller. Hydrofera  Blue has been changing himself. He had a heavy drainage last week which caused a lot of maceration around the wound so I took him out of a total contact cast he says the drainage is actually better this week He is seen today in conjunction with HBO 1/21; returns to clinic. He was up in [place] for [name] or 2 attending a funeral. He comes back in with the wound larger and with a large area of exposed bone. He had osteomyelitis and septic arthritis of the fifth left metatarsal head while he was in hospital. He received IV antibiotics in the hospital for a prolonged period of time then 3 weeks of Augmentin. Subsequently I gave him 2 weeks of doxycycline for more superficial wound infection. When I saw this last week the wound was smaller the surface of the wound looks satisfactory. 1/28; patient missed hyperbarics today. Bone biopsy  I did last time showed Enterococcus faecalis and Staphylococcus lugdunensis . He has a wide area of exposed bone. We are going to use silver alginate as of today. I had another ethical discussion with the patient. 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 In this situation I think the likelihood of healing this is low. Therefore I have recommended a ray amputation and with the patient's agreement I have referred him to Dr. Doran Durand. The other issue is that his compliance with hyperbarics has been minimal because of his work schedule and given his underlying decision I am going to stop this today READMISSION 10/24/2019 MRI 09/29/2019 left foot IMPRESSION: 1. Apparent skin ulceration inferior and lateral to the 5th metatarsal base with underlying heterogeneous T2 signal and enhancement in the subcutaneous fat. Small peripherally enhancing fluid collections along the plantar and lateral aspects of the 5th metatarsal base suspicious for abscesses. 2. Interval amputation through the mid 5th metatarsal with nonspecific low-level marrow edema  and enhancement. Given the proximity to the adjacent soft tissue inflammatory changes, osteomyelitis cannot be excluded. 3. The additional bones appear unremarkable. MRI 09/29/2019 right foot IMPRESSION: 1. Soft tissue ulceration lateral to the 5th MTP joint. There is low-level T2 hyperintensity within the 4th and 5th metatarsal heads and adjacent proximal phalanges without abnormal T1 signal or cortical destruction. These findings are nonspecific and could be seen with early marrow edema, hyperemia or early osteomyelitis. No evidence of septic joint. 2. Mild tenosynovitis and synovial enhancement associated with the extensor digitorum tendons at the level of the midfoot. 3. Diffuse low-level muscular T2 hyperintensity and enhancement, most consistent with diabetic myopathy. LEFT FOOT BONE Methicillin resistant staphylococcus aureus Staphylococcus lugdunensis MIC MIC CIPROFLOXACIN >=8 RESISTANT Resistant <=0.5 SENSI... Sensitive Lovin, Max ([id number]) [id number] Page 3 of 20 CLINDAMYCIN <=0.25 SENS... Sensitive >=8 RESISTANT Resistant ERYTHROMYCIN >=8 RESISTANT Resistant >=8 RESISTANT Resistant GENTAMICIN <=0.5 SENSI... Sensitive <=0.5 SENSI... Sensitive Inducible Clindamycin NEGATIVE Sensitive NEGATIVE Sensitive OXACILLIN >=4 RESISTANT Resistant 2 SENSITIVE Sensitive RIFAMPIN <=0.5 SENSI... Sensitive <=0.5 SENSI... Sensitive TETRACYCLINE <=1 SENSITIVE Sensitive <=1 SENSITIVE Sensitive TRIMETH/SULFA <=10 SENSIT Sensitive <=10 SENSIT Sensitive ... Marland Kitchen.. VANCOMYCIN 1 SENSITIVE Sensitive <=0.5 SENSI... Sensitive Right foot bone . Component 3 wk ago Specimen Description BONE Special Requests RIGHT 4 METATARSAL SAMPLE B Gram Stain NO WBC SEEN NO ORGANISMS SEEN Culture RARE METHICILLIN RESISTANT STAPHYLOCOCCUS AUREUS NO ANAEROBES ISOLATED Performed at [facility], [place] [address], [place], [place] [address] Report Status 10/08/2019  FINAL Organism ID, Bacteria METHICILLIN RESISTANT STAPHYLOCOCCUS AUREUS Resulting Agency [facility] Susceptibility Methicillin resistant staphylococcus aureus MIC CIPROFLOXACIN >=8 RESISTANT Resistant CLINDAMYCIN <=0.25 SENS... Sensitive ERYTHROMYCIN >=8 RESISTANT Resistant GENTAMICIN <=0.5 SENSI... Sensitive Inducible Clindamycin NEGATIVE Sensitive OXACILLIN >=4 RESISTANT Resistant RIFAMPIN <=0.5 SENSI... Sensitive TETRACYCLINE <=1 SENSITIVE Sensitive TRIMETH/SULFA <=10 SENSIT Sensitive ... VANCOMYCIN 1 SENSITIVE Sensitive This is a patient we had in clinic earlier this year with a wound over his left fifth metatarsal head. 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 Doran Durand on 05/16/2019. He seems to have developed problems with wounds on his bilateral feet in June 2021 although he may have had problems earlier than this. He was in an urgent care with a right foot ulcer on 09/26/2019 and given a course  of doxycycline. This was apparently after having trouble getting into see orthopedics. He was seen by podiatry on 09/28/2019 noted to have bilateral lower extremity ulcers including the left lateral fifth metatarsal base and the right subfifth met head. It was noted that had purulent drainage at that time. He required hospitalization from 6/20 through 7/2. This was because of worsening right foot wounds. He underwent bilateral operative incision and drainage and bone biopsies bilaterally. Culture results are listed above. He has been referred back to clinic by Dr. Jacqualyn Posey of podiatry. He is also followed by Dr. Megan Salon who saw him yesterday. He was discharged from hospital on Zyvox Flagyl and Levaquin and yesterday  changed to doxycycline Flagyl and Levaquin. His inflammatory markers on 6/26 showed a sedimentation rate of 129 and a C-reactive protein of 5. This is improved to 14 and 1.3 respectively. This would indicate improvement. ABIs in our clinic today were 1.23 on the right and 1.20 on the left 11/01/2019 on evaluation today patient appears to be doing fairly well in regard to the wounds on his feet at this point. Fortunately there is no signs of active infection at this time. No fevers, chills, nausea, vomiting, or diarrhea. He currently is seeing infectious disease and still under their care at this point. Subsequently he also has both wounds which she has not been using collagen on as he did not receive that in his packaging he did not call us and let us know that. Apparently that just was missed on the order. Nonetheless we will get that straightened out today. 8/9-Patient returns for bilateral foot wounds, using Prisma with hydrogel moistened dressings, and the wounds appear stable. Patient using surgical shoes, avoiding much pressure or weightbearing as much as possible 8/16; patient has bilateral foot wounds. 1 on the right lateral foot proximally the other is on the left mid lateral foot. Both required debridement of callus and thick skin around the wounds. We have been using silver collagen 8/27; patient has bilateral lateral foot wounds. The area on the left substantially surrounded by callus and dry skin. This was removed from the wound edge. The underlying wound is small. The area on the right measured somewhat smaller today. 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 Unfortunately I did not update his antibiotics during today's visit. 9/10 I reviewed Dr. Hale Bogus last notes he felt he had completed antibiotics his inflammatory markers were reasonably well controlled. He has a small wound on the lateral left foot and a tiny  area on the right which is just above closed. He is using Hydrofera Blue with border foam he has bilateral surgical shoes 9/24; 2 week f/u. doing well. right foot is closed. left foot still undermined. 10/14; right foot remains closed at the fifth met head. The area over the base of the left fifth metatarsal has a small open area but considerable undermining towards the plantar foot. Thick callus skin around this suggests an adequate pressure relief. We have talked about this. He says he is going to go back into his cam boot. I suggested a total contact cast he did not seem enamored with this suggestion 10/26; left foot base of the fifth metatarsal. Same condition as last time. He has skin over the area with an open wound however the skin is not adherent. He went to see Dr. Earleen Newport who did an x-ray and culture of his foot I have not reviewed the x-ray but the  patient was not told anything. He is on doxycycline 11/11; since the patient was last here he was in the emergency room on 10/30 he was concerned about swelling in the left foot. They did not do any cultures or x-rays. They changed his antibiotics to cephalexin. Previous culture showed group B strep. The cephalexin is appropriate as doxycycline has less than predictable coverage. Arrives in clinic today with swelling over this area under the wound. He also has a new wound on the right fifth metatarsal head 11/18; the patient has a difficult wound on the lateral aspect of the left fifth metatarsal head. The wound was almost ballotable last week I opened it slightly expecting to see purulence however there was just bleeding. I cultured this this was negative. X-ray unchanged. We are trying to get an MRI but I am not sure were going to be able to get this through his insurance. He also has an area on the right lateral fifth metatarsal head this looks healthier 12/3; the patient finally got our MRI. Surprisingly this did not show osteomyelitis. I did  show the soft tissue ulceration at the lateral plantar aspect of the fifth metatarsal base with a tiny residual 6 mm abscess overlying the superficial fascia I have tried to culture this area I have not been able to get this to grow anything. Nevertheless the protruding tissue looks aggravated. I suspect we should try to treat the underlying "abscess with broad-spectrum antibiotics. I am going to start him on Levaquin and Flagyl. He has much less edema in his legs and I am going to continue to wrap his legs and see him weekly 12/10. I started Levaquin and Flagyl on him last week. He just picked up the Flagyl apparently there was some delay. The worry is the wound on the left fifth metatarsal base which is substantial and worsening. His foot looks like he inverts at the ankle making this a weightbearing surface. Certainly no improvement in fact I think the measurements of this are somewhat worse. We have been using 12/17; he apparently just got the Levaquin yesterday this is 2 weeks after the fact. He has completed the Flagyl. The area over the left fifth metatarsal base still has protruding granulation tissue although it does not look quite as bad as it did some weeks ago. He has severe bilateral lymphedema although we have not been treating him for wounds on his legs this is definitely going to require compression. There was so much edema in the left I did not wish to put him in a [place], Max ([id number]) [id number] Page 4 of 20 total contact cast today. I am going to increase his compression from 3-4 layer. The area on the right lateral fifth met head actually look quite good and superficial. 12/23; patient arrived with callus on the right fifth met head and the substantial hyper granulated callused wound on the base of his fifth metatarsal. He says he is completing his Levaquin in 2 days but I do not think that adds up with what I gave him but I will have to double  check this. We are using Hydrofera Blue on both areas. My plan is to put the left leg in a cast the week after New Year's 04/06/2020; patient's wounds about the same. Right lateral fifth metatarsal head and left lateral foot over the base of the fifth metatarsal. There is undermining on the left lateral foot which I removed before application of total contact cast continuing with Hydrofera Blue  new. Patient tells me he was seen by endocrinology today lab work was done [Dr. Kerr]. Also wondering whether he was referred to cardiology. I went over some lab work from previously does not have chronic renal failure certainly not nephrotic range proteinuria he does have very poorly controlled diabetes but this is not his most updated lab work. Hemoglobin A1c has been over 11 1/10; the patient had a considerable amount of leakage towards mid part of his left foot with macerated skin however the wound surface looks better the area on the right lateral fifth met head is better as well. I am going to change the dressing on the left foot under the total contact cast to silver alginate, continue with Hydrofera Blue on the right. 1/20; patient was in the total contact cast for 10 days. Considerable amount of drainage although the skin around the wound does not look too bad on the left foot. The area on the right fifth metatarsal head is closed. Our nursing staff reports large amount of drainage out of the left lateral foot wound 1/25; continues with copious amounts of drainage described by our intake staff. PCR culture I did last week showed E. coli and Enterococcus faecalis and low quantities. Multiple resistance genes documented including extended spectrum beta lactamase, MRSA, MRSE, quinolone, tetracycline. The wound is not quite as good this week as it was 5 days ago but about the same size 2/3; continues with copious amounts of malodorous drainage per our intake nurse. The PCR culture I did 2 weeks ago showed E.  coli and low quantities of Enterococcus. There were multiple resistance genes detected. I put Neosporin on him last week although this does not seem to have helped. The wound is slightly deeper today. Offloading continues to be an issue here although with the amount of drainage she has a total contact cast is just not going to work 2/10; moderate amount of drainage. Patient reports he cannot get his stocking on over the dressing. I told him we have to do that the nurse gave him suggestions on how to make this work. The wound is on the bottom and lateral part of his left foot. Is cultured predominantly grew low amounts of Enterococcus, E. coli and anaerobes. There were multiple resistance genes detected including extended spectrum beta lactamase, quinolone, tetracycline. I could not think of an easy oral combination to address this so for now I am going to do topical antibiotics provided by [facility] I think the main agents here are vancomycin and an aminoglycoside. We have to be able to give him access to the wounds to get the topical antibiotic on 2/17; moderate amount of drainage this is unchanged. He has his Keystone topical antibiotic against the deep tissue culture organisms. He has been using this and changing the dressing daily. Silver alginate on the wound surface. 2/24; using Keystone antibiotic with silver alginate on the top. He had too much drainage for a total contact cast at one point although I think that is improving and I think in the next week or 2 it might be possible to replace a total contact cast I did not do this today. In general the wound surface looks healthy however he continues to have thick rims of skin and subcutaneous tissue around the wide area of the circumference which I debrided 06/04/2020 upon evaluation today patient appears to be doing well in regard to his wound. I do feel like he is showing signs of improvement. There is little bit  of callus and dead tissue around  the edges of the wound as well as what appears to be a little bit of a sinus tract that is off to the side laterally I would perform debridement to clear that away today. 3/17; left lateral foot. The wound looks about the same as I remember. Not much depth surface looks healthy. No evidence of infection 3/25; left lateral foot. Wound surface looks about the same. Separating epithelium from the circumference. There really is no evidence of infection here however not making progress by my view 3/29; left lateral foot. Surface of the wound again looks reasonably healthy still thick skin and subcutaneous tissue around the wound margins. There is no evidence of infection. One of the concerns being brought up by the nurses has again the amount of drainage vis--vis continued use of a total contact cast 4/5; left lateral foot at roughly the base of the fifth metatarsal. Nice healthy looking granulated tissue with rims of epithelialization. The overall wound measurements are not any better but the tissue looks healthy. The only concern is the amount of drainage although he has no surrounding maceration with what we have been doing recently to absorb fluid and protect his skin. He also has lymphedema. 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 He states that the kerlix/coban under the TCC rolled down and could not get it back up. He has been using an offloading scooter and has somehow hurt his right foot using this device. This happened last week. He states that the side of his right foot developed a blister and opened. The top of his foot also has a few small open wounds he thinks is due to his socks rubbing in his shoes. He has not been using any dressings to the  wound. He denies purulent drainage, fever/chills or erythema to the wounds. 4/22; patient presents for 1 week follow-up. He developed new wounds to the right foot that were evaluated at last clinic visit. He continues to have a total contact cast to the left leg and he reports no issues. He has been using silver collagen to the right foot wounds with no issues. He denies purulent drainage, fever/chills or erythema to the right foot wounds. He has no complaints today 4/25; patient presents for 1 week follow-up. He has a total contact cast of the left leg and reports no issues. He has been using silver alginate to the right foot wound. He denies purulent drainage, fever/chills or erythema to the right foot wounds. 5/2 patient presents for 1 week follow-up. T contact cast on the left. The wound which is on the base of the plantar foot at the base of the fifth metatarsal otal actually looks quite good and dimensions continue to gradually contract. HOWEVER the area on the right lateral fifth metatarsal head is much larger than what I remember from 2 weeks ago. Once more is he has significant levels of hypergranulation. Noteworthy that he had this same hyper granulated response on his wound on the left foot at one point in time. So much so that he I thought there was an underlying fluid collection. Based on this I think this just needs debridement. 5/9; the wound on the left actually continues to  be gradually smaller with a healthy surface. Slight amount of drainage and maceration of the skin around but not too bad. However he has a large wound over the right fifth metatarsal head very much in the same configuration as his left foot wound was initially. I used silver nitrate to address the hyper granulated tissue no mechanical debridement 5/16; area on the left foot did not look as healthy this week deeper thick surrounding macerated skin and subcutaneous tissue. The area on the right foot fifth met head  was about the same The area on the right ankle that we identified last week is completely broken down into an open wound presumably a stocking rubbing issue 5/23; patient has been using a total contact cast to the left side. He has been using silver alginate underneath. He has also been using silver alginate to the right foot wounds. He has no complaints today. He denies any signs of infection. 5/31; the left-sided wound looks some better measure smaller surface granulation looks better. We have been using silver alginate under the total contact cast The large area on his right fifth met head and right dorsal foot look about the same still using silver alginate 6/6; neither side is good as I was hoping although the surface area dimensions are better. A lot of maceration on his left and right foot around the wound edge. Area on the dorsal right foot looks better. He says he was traveling. I am not sure what does the amount of maceration around the plantar wounds may be drainage issues 6/13; in general the wound surfaces look quite good on both sides. Macerated skin and raised edges around the wound required debridement although in general especially on the left the surface area seems improved. The area on the right dorsal ankle is about the same I thought this would not be such a problem to close 6/20; not much change in either wound although the one on the right looks a little better. Both wounds have thick macerated edges to the skin requiring debridements. We have been using silver alginate. The area on the dorsal right ankle is still open I thought this would be closed. 6/28; patient comes in today with a marked deterioration in the right foot wound fifth met head. Wide area of exposed bone this is a drastic change from last time. The area on the left there we have been casting is stagnant. We have been using silver alginate in both wound areas. 7/5; bone culture I did for PCR last time was  positive for Pseudomonas, group B strep, Enterococcus and Staph aureus. There was no suggestion of methicillin Struss, Max ([id number]) [id number] Page 5 of 20 resistance or ampicillin resistant genes. This was resistant to tetracycline however He comes into the clinic today with the area over his right plantar fifth metatarsal head which had been doing so well 2 weeks ago completely necrotic feeling bone. I do not know that this is going to be salvageable. The left foot wound is certainly no smaller but it has a better surface and is superficial. 7/8; patient called in this morning to say that his total contact cast was rubbing against his foot. He states he is doing fine overall. He denies signs of infection. 7/12; continued deterioration in the wound over the right fifth metatarsal head crumbling bone. This is not going to be salvageable. The patient agrees and wants to be referred to Dr. Doran Durand which we will attempt to arrange as soon as possible.  I am going to continue him on antibiotics as long as that takes so I will renew those today. The area on the left foot which is the base of the fifth metatarsal continues to look somewhat better. Healthy looking tissue no depth no debridement is necessary here. 7/20; the patient was kindly seen by Dr. Doran Durand of orthopedics on 10/19/2020. 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 Towards this end we have taken him out of the total contact cast on the left we will put him in a wrap with Hydrofera Blue. As I understand things surgery is planned for 7/21 7/27; patient had his surgery last Thursday. 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 Apparently everything went well we did not still disturb that today The area on the left foot actually looks quite good. He has been much less mobile which probably explains this he did not seem to  do well in the total contact cast secondary to drainage and maceration I think. 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 Fortunately there is no evidence of active infection at this time. No fevers, chills, nausea, vomiting, or diarrhea. Overall I think that he is actually doing extremely well. Nonetheless I do believe that he is staying off of this more following the surgery in his right foot that is the reason the left is doing so great. 8/16; left plantar foot wound. This looks smaller than the last time I saw this he is using Hydrofera Blue. The surgical wound on the right foot is being followed by Dr. Doran Durand we did not look at this today. He has surgical shoes on both feet 8/23; left plantar foot wound not as good this week. Surrounding macerated skin and subcutaneous tissue everything looks moist and wet. I do not think he is offloading this adequately. He is using a surgical shoe Apparently the right foot surgical wound is not open although I did not check his foot 8/31; left plantar foot lateral aspect. Much improved this week. He has no maceration. Some improvement in the surface area of the wound but most impressively the depth is come in we are using silver alginate. The patient is a Product/process development scientist. He is asked that we write him a letter so he can go back to work. I have also tried to see if we can write something that will allow him to limit the amount of time that he is on his foot at work. 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 Hopefully the latter can be avoided 9/6; I believe the patient missed an appointment last week. He arrives in today with a wound looking roughly the same certainly no better. Undermining laterally and also inferiorly. We used molecuLight today in training with  the patient's permission.. We are using silver alginate 9/21 wound is measuring bigger this week although this may have to do with the aggressive circumferential debridement last week in response to the blush fluorescence on the MolecuLight. Culture I did last week showed significant MSSA and E. coli. I put him on Augmentin but he has not started it yet. We are also going to send this for compounded antibiotics at [facility]. There is  no evidence of systemic infection 9/29; silver alginate. His Keystone arrived. He is completing Augmentin in 2 days. Offloading in a cam boot. Moderate drainage per our intake staff 10/5; using silver alginate. He has been using his [place]. He has completed his Augmentin. Per our intake nurse still a lot of drainage, far too much to consider a total contact cast. Wound measures about the same. He had the same undermining area that I defined last week from a roughly 11-3. I remove this today 10/12; using silver alginate he is using the [place]. He comes in for a nurse visit hence we are applying [employer] twice a week. Measuring slightly better today and less notable drainage. Extensive debridement of the wound edge last time 10/18; using topical Keystone and silver alginate and a soft cast. Wound measurements about the same. Drainage was through his soft cast. We are changing this twice a week Tuesdays and Friday 10/25; comes in with moderate drainage. Still using Keystone silver alginate and a soft cast. Wound dimensions completely the same.He has a lot of edema in the left leg he has lymphedema. Asking for [name] to consider wrapping him as he cannot get his stocking on over the soft cast 11/2; comes in with moderate to large drainage slightly smaller in terms of width we have been using [place]. His wound looks satisfactory but not much improvement 11/4; patient presents today for obligatory cast change. Has no issues or complaints today. He denies signs of  infection. 11/9; patient traveled this weekend to [place], was on the cast quite a bit. Staining of the cast with black material from his walking boot. Drainage was not quite as bad as we feared. Using silver alginate and Keystone 11/16; we do not have size for cast therefore we have been putting a soft cast on him since the change on Friday. Still a significant amount of drainage necessitating changing twice a week. We have been using the Keystone at cast changes either hard or soft as well as silver alginate Comes in the clinic with things actually looking fairly good improvement in width. He says his offloading is about the same 02/24/2021 upon evaluation today patient actually comes back in and is doing excellent in regard to his foot ulcer this is significantly smaller even compared to the last visit. The soft cast seems to have done extremely well for him which is great news. I do not see any signs of infection minimal debridement will be needed today. 11/30; left lateral foot much improved half a centimeter improvement in surface area. No evidence of infection. He seems to be doing better with the soft cast in the TCC therefore we will continue with this. He comes back in later in the week for a change with the nurses. This is due to drainage 12/6; no improvement in dimensions. Under illumination some debris on the surface we have been using silver alginate, soft cast. If there is anything optimistic here he seems to have have less drainage 12/13. Dimensions are improved both length and width and slightly in depth. Appears to be quite healthy today. Raised edges of this thick skin and callus around the edges however. He is in a soft cast were bringing him back once for a change on Friday. Drainage is better 12/20. Dimensions are improved. He still has raised edges of thick skin and callus around the edges. We are using a soft cast 12/28; comes in today with thick callus around the wound. Using  silver under alginate  under a soft cast. I do not think there is much improvement in any measurement 2023 04/06/2021; patient was put in a total contact cast. Unfortunately not much change in surface area 1/10; not much different still thick callus and skin around the edge in spite of the total contact cast. This was just debrided last week we have been using the [facility] compounded antibiotic and silver alginate under a total contact cast 1/18 the patient's wound on the left side is doing nicely. smaller HOWEVER he comes in today with a wound on the right foot laterally. blister most likely serosangquenous drainage 1/24; the patient continues to do well in terms of the plantar left foot which is continued to contract using silver alginate under the total contact cast Latin, Max ([id number]) [id number] Page 6 of 20 HOWEVER the right lateral foot is bigger with denuded skin around the edges. I used pickups and a #15 scalpel to remove this this looks like the remanence of a large blister. Cannot rule out infection. Culture in this area I did last week showed Staphylococcus lugdunensis few colonies. I am going to try to address this with his [employer] antibiotic that is done so well on the left having linezolid and this should cover the staph 2/1; the patient's wound on his left foot which was the original plantar foot wound thick skin and eschar around the edges even in the total contact cast but the wound surface does not look too bad The real problem is on how his right lateral foot at roughly the base of the fifth metatarsal. The wound is completely necrotic more worrisome than that there is swelling around the edges of this. We have been using silver alginate on both wounds and Keystone on the right foot. Unfortunately I think he is going to require systemic antibiotics while we await cultures. He did not get the x-ray done that we ordered last week [lost the  prescription 2/7; disappointingly in the area on the left foot which we are treating with a total contact cast is still not closed although it is much smaller. He continues to have a lot of callus around the wound edge. -Right lateral foot culture I did last week was negative x-ray also negative for osteomyelitis. 2/15: TCC silver alginate on the left and silver alginate on the right lateral. No real improvement in either area 05/26/2021: T oday, the wounds are roughly the same size as at his previous visit, post-debridement. He continues to endorse fairly substantial drainage, particularly on the right. He has been in a total contact cast on the left. There is still some callus surrounding this lesion. On the right, the periwound skin is quite macerated, along with surrounding callus. The center of the right-sided wound also has some dark, densely adherent material, which is very difficult to remove. 06/02/2021: Today, both wounds are slightly smaller. He has been using zinc oxide ointment around the right ulcer and the degree of maceration has improved markedly. There continues to be an area of nonviable tissue in the center of the right sided ulcer. The left-sided wound, which has been in the total contact cast. Appears clean and the degree of callus around it is less than previously. 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 He had to take the stairs and both wounds have increased in size. The left foot, which has been in a total contact cast, has developed a tunnel tracking to the lateral  aspect of his foot. The nonviable tissue in the center of the right-sided ulcer remains recalcitrant to debridement. There is significant undermining surrounding the entirety of the left sided wound. 06/16/2021: The elevator at school has been fixed and the patient has been able to avoid putting as much weight on his wounds over the past week.  We converted the left foot wound into a single lesion today, but despite this, the wound is actually smaller. The base is healthy with limited periwound callus. On the right, the central necrotic area is still present. He continues to be quite macerated around the right sided wound, despite applying barrier cream. This does, however, have the benefit of softening the callus to make it more easily removable. 06/23/2021: Today, the left wound is smaller. The lateral aspect that had opened up previously is now closed. The wound base has a healthy bed of granulation tissue and minimal slough. Unfortunately, on the right, the wound is larger and continues to be fairly macerated. He has also reopened the wound at his right ankle. He thinks this is due to the gait he has adopted secondary to his total contact cast and boot. 06/30/2021: T oday, both wounds are a little bit larger. The lateral aspect on the left has remained closed. He continues to have significant periwound maceration. The culture that I took from the right sided wound grew a population of bacteria that is not covered by his current [facility] antibiotic. The center of the right- sided wound continues to appear necrotic with nonviable fat. It probes deeper today, but does not reach bone. 07/07/2021: The periwound maceration is a little bit less today. The right lateral foot wound has some areas that appear more viable and the necrotic center also looks a little bit better. The wound on the dorsal surface of his right foot near the ankle is contracting and the surface appears healthy. The left plantar wound surface looks healthy, but there is some new undermining on the medial portion. He did get his new Keystone antibiotic and began applying that to the right foot wound on Saturday. 07/14/2021: The intake nurse reported substantial drainage from his wounds, but the periwound skin actually looks better than is typical for him. The wound on the dorsal  surface of his right foot near the ankle is smaller and just has a small open area underneath some dried eschar. The left plantar wound surface looks healthy and there has been no significant accumulation of callus. The right lateral foot wound looks quite a bit better, with the central portion, which typically appears necrotic, looking more viable albeit pale. 07/22/2021: His left foot is extremely macerated today. The wound is about the same size. The wound on the dorsal surface of his right foot near the ankle had closed, but he traumatized it removing the dressing and there is a tiny skin tear in that location. The right lateral foot wound is bigger, but the surface appears healthy. 07/30/2021: The wound on the dorsal surface of his right foot near the ankle is closed. The right lateral foot wound again is a little bit bigger due to some undermining. The periwound skin is in better condition, however. He has been applying zinc oxide. The wound surface is a little bit dry today. On the left, he does not have the substantial maceration that we frequently see. The wound itself is smaller and has a clean surface. 08/06/2021: Both wounds seem to have deteriorated over the past week. The right lateral foot  wound has a dry surface but the periwound is boggy.. Overall wound dimensions are about the same. On the left, the wound is about the same size, but there is more undermining present underneath periwound callus. 08/13/2021: The right sided wound looks about the same, but on the left there has been substantial deterioration. The undermining continues to extend under periwound callus. Once this was removed, substantial extension of the wound was present. There is no odor or purulent drainage but clearly the wounds have broken down. 08/20/2021: The wounds look about the same today. He has been out of his total contact cast and has just been changing the dressings using topical Keystone with PolyMem Ag, Kerlix  and Ace bandages. The wound on the top of his right ankle has reopened but this is quite small. There was a little bit of purulent material that I expressed when examining this wound. 08/24/2021: After the aggressive debridement I performed at his last visit, the wounds actually look a little bit better today. They are smaller with the exception of the wound on the top of his right ankle which is a little bit bigger as some more skin pulled off when he was changing his dressing. We are using topical Keystone with PolyMem Ag Kerlix and Ace bandages. 09/02/2021: There has been really no change to any of his wounds. 09/16/2021: The patient was hospitalized last week with nausea, vomiting, and dehydration. He says that while he was in the hospital, his wounds were not really addressed properly. T oday, both plantar foot wounds are larger and the periwound skin is macerated. The wound on the dorsum of his right foot has a scab on the top. The right foot now has a crater where previously he had had nonviable fat. It looks as though this simply died and fell out. The periwound callus is wet. 09/24/2021: His wounds have deteriorated somewhat since his last visit. The wound on the dorsum of his right foot near his ankle is larger and has more nonviable tissue present. The crater in his right foot is even deeper; I cannot quite palpate or probe to bone but I am sure it is close. The wound on his left plantar foot has an odd boggy area in the center that almost feels as though it has fluid within it. He has run out of his topical Keystone antibiotic. We are using silver alginate on his wounds. 09/29/2021: He has developed a new wound on the dorsum of his left foot near his ankle. He says he thinks his wrapping is rubbing in that site. I would concur with this as the wound on his right ankle is larger. The left foot looks about the same. The right foot has the crater that was present last week. No significant slough  accumulation, but his foot remains quite swollen and warm despite oral antibiotic therapy. 10/08/2021: All of his wounds look about the same as last week. He did not start his oral antibiotics that are prescribed until just a couple of days ago; his [employer] compounded antibiotics formula has been changed and he is awaiting delivery of the new recipe. His MRI that was scheduled for earlier this week was canceled as no prior authorization had been obtained; unfortunately the tech responsible sent an email to my old  email, which I no longer use [place], Max ([id number]) [id number] Page 7 of 20 nor have access to. 10/18/2021: The wounds on his bilateral dorsal feet near the ankles are both improved. They are  smaller and have just some eschar and slough buildup. The left plantar wound has a fair amount of undermining, but the surface is clean. There is some periwound callus accumulation. On the right plantar foot, there is nonviable fat leading to a deep tunnel that tracks towards his dorsal medial foot. There is periwound callus and slough accumulation, as well. His right foot and leg remain swollen as compared to the left. 10/25/2021: The wounds on his bilateral dorsal feet and at the ankles have broken down somewhat. They are little bit larger than last week. The left plantar wound continues to undermine laterally but the surface is clean. The right plantar foot wound shows some decreased depth in the tunnel tracking towards his dorsal medial foot. He has not yet had the Doppler study that I ordered; it sounds like there is some confusion about the scheduling of the procedure. In addition, the MRI was denied and I have taken steps to appeal the denial. 11/24/2021: Since our last visit, Mr. Antrim was admitted to the hospital where an MRI suggested osteomyelitis. He was taken the operating room by podiatry. Bone biopsies were negative for osteomyelitis. They  debrided his wounds and applied myriad matrix. He saw them last week and they removed his staples. He is here today to continue his wound healing process. T oday, both of the dorsal foot/ankle wounds are substantially smaller. There is just a little eschar overlying the left sided wound and some eschar and slough on the right. The right plantar foot ulcer has the healthiest surface of granulation tissue that I have seen to date. A portion of the myriad matrix failed to take and was hanging loose. It appears that myriad morcells were placed into the tunnel closest to the dorsal portion of his foot. These have sloughed off. The left plantar foot ulcer is about the same size, but has a much healthier surface than in the past. Both plantar ulcers have callus and slough accumulation. 12/07/2021: Left dorsal foot/ankle wound is closed. The right dorsal foot/ankle wound is nearly closed and just has a small open area with some eschar and slough. The right plantar foot wound has contracted quite a bit since our last visit. It has a healthy surface with just a little bit of slough accumulation and periwound callus buildup. The left plantar foot wound is about the same size but the surface appears healthy. There is a little slough and periwound callus on this side, as well. 12/15/2021: Both dorsal foot/ankle wounds are closed. The right plantar foot wound is substantially smaller than at our last visit. The tunneling that was present has nearly closed. There is just a little bit of slough buildup. The left plantar foot wound is also a little bit smaller today. The surface is the healthiest that I have ever seen it. Light slough and periwound callus accumulation on this side. 12/22/2021: The dorsal ankle/foot wounds remain closed. The right plantar foot wound continues to contract. There is still a bit of depth at the lateral portion of the wound but the surface has a good granulated appearance. The wound on the left  is about the same size, the central indented portion is still adherent. It also is clean with good granulation tissue present. The periwound skin and callus are a bit macerated, however. 12/29/2021: Both ankle wounds remain closed. The right plantar foot wound is less than half the size that it was last week. There is no depth any longer and the surface has just a  little bit of slough and periwound callus. On the left, the wound is not much smaller, but the surface is healthier with good granulation tissue. There is also a little slough and periwound callus buildup. 01/05/2022: The right plantar foot wound continues to contract and is once again about half the size as it was last week. There is just a little bit of surface slough and periwound callus. On the left, the depth of the wound has decreased and the diameter has started to contract. It is also very clean with just a little slough and biofilm present. 01/12/2022: The right plantar foot wound is smaller again today. There is just some periwound callus and slough accumulation. On the left, there is a fair amount of undermining and the surface is a little boggy, but no other significant change. 01/19/2022: The right plantar wound continues to contract. There is minimal periwound callus accumulation and just a light layer of slough on the surface. On the left, the surface looks better, but there is fairly significant undermining near the 11:00 portion of the wound, aiming towards his great toe. The skin overlying this area is very healthy and has a good fat layer present. No malodor or purulent drainage. 01/26/2022: The right sided wound continues to contract and has just a light layer of slough on the surface. Minimal periwound callus. On the left, he still has substantial undermining and the tissue surface is not as robust as I would like to see. No malodor or purulent drainage, but he did say he had a lot of serous drainage over the  weekend. 02/02/2022: The right foot wound is about a quarter of the size that it was at his last visit. There is just a little slough on the surface and some periwound callus. On the left, the undermining persists and the tissue is still more pale, but he does not seem to have had as much drainage over the last weekend. We are waiting on a wound VAC. 02/09/2022: His right foot has healed. He received the wound VAC, but did not bring it to clinic with him today. The lateral aspect of the left foot wound has come in a little, but he still has substantial undermining on the medial and distal portion of the wound. Still with macerated periwound callus. 02/16/2022: The wound VAC was applied last Friday. T oday, there has been tremendous improvement in the surface of the wound bed. The undermined portion of the wound has come in a bit. There is still some overhanging dead skin. Overall the wound looks much better. 02/23/2022: No significant change in the overall wound dimensions, but the wound surface continues to improve and appears healthier and more robust. There is some periwound callus accumulation and overhanging skin. No concern for infection. 03/02/2022: Although the measurements taken in clinic today were unchanged, the visual appearance of the wound looks like it is smaller and the undermining/tunneling is filling with flesh. There is less periwound tissue maceration than usual. No malodor or purulent drainage. No concern for infection. 03/09/2022: The undermining has come in by couple of millimeters. The periwound is a little bit more macerated this week. No concern for infection. 12/13; substantial wound on the left plantar foot. We are using silver collagen and a wound VAC. 03/23/2022: The undermining continues to contract. The wound surface is a little bit dry. 03/30/2022: The undermining has essentially resolved. There is still some depth at the center of the wound. The wound surface has good  beefy tissue  and the moisture balance is better. Electronic Signature(s) Signed: 03/30/2022 8:52:37 AM By: Max Maudlin MD FACS Entered By: [name] on 03/30/2022 08:52:36 [name], Max ([id number]) [id number] Page 8 of 20 -------------------------------------------------------------------------------- Physical Exam Details Patient Name: Date of Service: [name] 03/30/2022 8:15 A M Medical Record Number: [id number] Patient Account Number: [id number] Date of Birth/Sex: Treating RN: 1986/01/03 (36 y.o. M) Primary Care Provider: Seward [name] Other Clinician: Referring Provider: Treating Provider/Extender: [name] in Treatment: 126 Constitutional Slightly hypertensive. . . . No acute distress. Respiratory Normal work of breathing on room air. Notes 03/30/2022: The undermining has essentially resolved. There is still some depth at the center of the wound. The wound surface has good beefy tissue and the moisture balance is better. Electronic Signature(s) Signed: 03/30/2022 8:53:14 AM By: Max Maudlin MD FACS Entered By: [name] on 03/30/2022 08:53:14 -------------------------------------------------------------------------------- Physician Orders Details Patient Name: Date of Service: [name], [name] 03/30/2022 8:15 A M Medical Record Number: [id number] Patient Account Number: [id number] Date of Birth/Sex: Treating RN: 07-31-1985 (36 y.o. 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 - Dr. [name] -  Room 1 with [name] Return A Bathing/ Shower/ Hygiene May shower and wash wound with soap and water. Negative Presssure Wound Therapy Wound #3 Left,Lateral,Plantar Foot Wound Vac to wound continuously at 155m/hg pressure Black Foam Edema Control - Lymphedema / SCD / Other Bilateral Lower Extremities Avoid standing for long periods of time. Exercise regularly Compression stocking or Garment 20-30 mm/Hg pressure to: - to both legs daily Off-Loading Open toe surgical shoe to: - Both feet Additional Orders / Instructions Follow Nutritious Diet Non Wound Condition pply the following to affected area as directed: - continue to apply foam donut or cushion to healed right plantar foot A Macek, CMali([id number] Page 9 of 20 Wound Treatment Wound #3 - Foot Wound Laterality: Plantar, Left, Lateral Cleanser: Soap and Water 3 x Per Week/30 Days Discharge Instructions: May shower and wash wound with dial antibacterial soap and water prior to dressing change. Peri-Wound Care: Skin Prep (DME) (Generic) 3 x Per Week/30 Days Discharge Instructions: Use skin prep as directed Prim Dressing: Promogran Prisma Matrix, 4.34 (sq in) (silver collagen) (Generic) 3 x Per Week/30 Days ary Discharge Instructions: Moisten collagen with saline or hydrogel Prim Dressing: VAC ary 3 x Per Week/30 Days Secondary Dressing: Zetuvit Plus 4x8 in (DME) (Generic) 3 x Per Week/30 Days Discharge Instructions: Apply over primary dressing as directed. Secured With: Elastic Bandage 4 inch (ACE bandage) (Generic) 3 x Per Week/30 Days Discharge Instructions: Secure with ACE bandage as directed. Secured With: 40M Medipore SPublic affairs consultantSurgical T 2x10 (in/yd) (Generic) 3 x Per Week/30 Days ape Discharge Instructions: Secure with tape as directed. Compression Wrap: Kerlix Roll 4.5x3.1 (in/yd) (Generic) 3 x Per Week/30 Days Discharge Instructions: Apply Kerlix and Coban compression as  directed. Electronic Signature(s) Signed: 03/30/2022 12:29:27 PM By: CFredirick MaudlinMD FACS Signed: 04/01/2022 12:15:24 PM By: ZBlanche EastRN Previous Signature: 03/30/2022 12:13:53 PM Version By: CFredirick MaudlinMD FACS Entered By: ZBlanche Easton 03/30/2022 12:23:04 -------------------------------------------------------------------------------- Problem List Details Patient Name: Date of Service: [name], [name] 03/30/2022  8:15 A M Medical Record Number: [id number] Patient Account Number: [id number] Date of Birth/Sex: Treating RN: 12/02/1985 (36 y.o. M) Primary Care Provider: Seward [name] Other Clinician: Referring Provider: Treating Provider/Extender: [name] in Treatment: 126 Active Problems ICD-10 Encounter Code Description Active Date MDM Diagnosis L97.528 Non-pressure chronic ulcer of other part of left foot with other specified 10/24/2019 No Yes severity E11.621 Type 2 diabetes mellitus with foot ulcer 10/24/2019 No Yes Inactive Problems ICD-10 Code Description Active Date Inactive Date L97.518 Non-pressure chronic ulcer of other part of right foot with other specified severity 07/14/2020 07/14/2020 L97.518 Non-pressure chronic ulcer of other part of right foot with other specified severity 10/24/2019 10/24/2019 [name], Max ([id number]) [id number] Page 10 of 20 [phone] Other chronic osteomyelitis, right ankle and foot 10/24/2019 10/24/2019 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 08/10/2020 08/10/2020 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, left ankle and foot 10/24/2019 10/24/2019 L97.322 Non-pressure chronic ulcer of left ankle with fat layer exposed 09/29/2021 09/29/2021 B95.62 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 10/24/2019 10/24/2019 classified elsewhere Resolved Problems Electronic Signature(s) Signed: 03/30/2022 8:51:05  AM By: Max Maudlin MD FACS Entered By: [name] on 03/30/2022 08:51:05 -------------------------------------------------------------------------------- Progress Note Details Patient Name: Date of Service: [name], [name] 03/30/2022 8:15 A M Medical Record Number: [id number] Patient Account Number: [id number] Date of Birth/Sex: Treating RN: 1985-10-16 (36 y.o. 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 He comes in for review of a wound over the plantar fifth metatarsal head extending into the lateral part of the foot. He was followed for this previously by his podiatrist Dr. Cornelius [name]. As the patient tells his story he went to see podiatry first for a swelling he developed on the lateral part of his fifth metatarsal head in May. He states this was "open" by podiatry and the area closed. He was followed up in June and it was again opened callus removed and it closed promptly. There were plans being made for surgery on the fifth metatarsal head in June however his blood sugar was apparently too high for anesthesia. Apparently the area was debrided and opened again in June and it is never closed since. Looking over the records from podiatry I am really not able to follow this. It was clear when he was first seen it was before 5/14 at that point he already had a wound. By 5/17 the ulcer was resolved. I do  not see anything about a procedure. On 5/28 noted to have pre-ulcerative moderate keratosis. X-ray noted 1/5 contracted toe and tailor's bunion and metatarsal deformity. On a visit date on 09/28/2018 the dorsal part of the left foot it healed and resolved. There was concern about swelling in his lower extremity he was sent to the ER.. As far as I can tell he was seen in the ER on 7/12 with an ulcer on his left foot. A DVT rule out of the left leg was negative. I do not think I have complete  records from podiatry but I am not able to verify the procedures this patient states he had. He states after the last procedure the wound has never closed although I am not able to follow this in the records I have from podiatry. He has not had a recent x-ray The patient has been using Neosporin on the wound. He is wearing a Darco shoe. He is still very active up on his foot working and exercising. Past medical history; type 2 diabetes ketosis-prone, leg swelling with a negative DVT study in July. Non-smoker ABI in our clinic was 0.85 on the left 10/16; substantial wound on the plantar left fifth met head extending laterally almost to the dorsal fifth MTP. We have been using silver alginate we gave him a Darco forefoot off loader. An x-ray did not show evidence of osteomyelitis did note soft tissue emphysema which I think was due to gas tracking through an open wound. There is no doubt in my mind he requires an MRI 10/23; MRI not booked until 3 November at the earliest this is largely due to his glucose sensor in the right arm. We have been using silver alginate. There has been an improvement 10/29; I am still not exactly sure when his MRI is booked for. He says it is the third but it is the 10th in epic. This definitely needs to be done. He is running a low-grade fever today but no other symptoms. No real improvement in the 1 02/26/2019 patient presents today for a follow-up visit here in our clinic he is last  been seen in the clinic on October 29. Subsequently we were working on getting MRI to evaluate and see what exactly was going on and where we would need to go from the standpoint of whether or not he had osteomyelitis and [place], Max ([id number]) [id number] Page 11 of 20 again what treatments were going be required. Subsequently the patient ended up being admitted to the hospital on 02/07/2019 and was discharged on 02/14/2019. This is a somewhat interesting admission with a discharge diagnosis of pneumonia due to COVID-19 although he was positive for COVID-19 when tested at the urgent care but negative x2 when he was actually in the hospital. With that being said he did have acute respiratory failure with hypoxia and it was noted he also have a left foot ulceration with osteomyelitis. With that being said he did require oxygen for his pneumonia and I level 4 L. He was placed on antivirals and steroids for the COVID-19. He was also transferred to the [facility] at one point. Nonetheless he did subsequently discharged home and since being home has done much better in that regard. The CT angiogram did not show any pulmonary embolism. With regard to the osteomyelitis the patient was placed on vancomycin and Zosyn while in the hospital but has been changed to Augmentin at discharge. It was also recommended that he follow- up with wound care and podiatry. Podiatry however wanted him to see [name] according to the patient prior to them doing anything further. His hemoglobin A1c was 9.9 as noted in the hospital. Have an MRI of the left foot performed while in the hospital on 02/04/2019. This showed evidence of septic arthritis at the fifth MTP joint and osteomyelitis involving the fifth metatarsal head and proximal phalanx. There is an overlying plantar open wound noted an abscess tracking back along the lateral aspect of the fifth metatarsal shaft. There is otherwise diffuse  cellulitis and mild fasciitis without  findings of polymyositis. The patient did have recently pneumonia secondary to COVID-19 I looked in the chart through epic and it does appear that the patient may need to have an additional x-ray just to ensure everything is cleared and that he has no airspace disease prior to putting him into the [name]. 03/05/2019; patient was readmitted to the clinic last week. He was hospitalized twice for a viral upper respiratory tract infection from 11/1 through 11/4 and then 11/5 through 11/12 ultimately this turned out to be Covid pneumonitis. Although he was discharged on oxygen he is not using it. He says he feels fine. He has no exercise limitation no cough no sputum. His O2 sat in our clinic today was 100% on room air. 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 He received Vanco and Zosyn in the hospital and then was discharged on 2 weeks of Augmentin. I do not see any relevant cultures. He was supposed to follow-up with infectious disease but I do not see that he has an appointment. 12/8; patient saw Dr. Novella Olive of infectious disease last week. He felt that he had had adequate antibiotic therapy. He did not go to follow-up with Dr. Amalia Hailey of podiatry and I have again talked to him about the pros and cons of this. He does not want to consider a ray amputation of this time. He is aware of the risks of recurrence, migration etc. He started HBO today and tolerated this well. He can complete the Augmentin that I gave him last week. I have looked over the lab work that Dr. Chana Bode ordered his C-reactive protein was 3.3 and his sedimentation rate was 17. The C-reactive protein is never really been measurably that high in this patient 12/15; not much change in the wound today however he has undermining along the lateral part of the foot again more extensively than  last week. He has some rims of epithelialization. We have been using silver alginate. He is undergoing hyperbarics but did not dive today 12/18; in for his obligatory first total contact cast change. Unfortunately there was pus coming from the undermining area around his fifth metatarsal head. This was cultured but will preclude reapplication of a cast. He is seen in conjunction with HBO 12/24; patient had staph lugdunensis in the wound in the undermining area laterally last time. We put him on doxycycline which should have covered this. The wound looks better today. I am going to give him another week of doxycycline before reattempting the total contact cast 12/31; the patient is completing antibiotics. Hemorrhagic debris in the distal part of the wound with some undermining distally. He also had hyper granulation. Extensive debridement with a #5 curette. The infected area that was on the lateral part of the fifth met head is closed over. I do not think he needs any more antibiotics. Patient was seen prior to HBO. Preparations for a total contact cast were made in the cast will be placed post hyperbarics 04/11/19; once again the patient arrives today without complaint. He had been in a cast all week noted that he had heavy drainage this week. This resulted in large raised areas of macerated tissue around the wound 1/14; wound bed looks better slightly smaller. Hydrofera Blue has been changing himself. He had a heavy drainage last week which caused a lot of maceration around the wound so I took him out of a  total contact cast he says the drainage is actually better this week He is seen today in conjunction with HBO 1/21; returns to clinic. He was up in [place] for [name] or 2 attending a funeral. He comes back in with the wound larger and with a large area of exposed bone. He had osteomyelitis and septic arthritis of the fifth left metatarsal head while he was in hospital. He received IV antibiotics in  the hospital for a prolonged period of time then 3 weeks of Augmentin. Subsequently I gave him 2 weeks of doxycycline for more superficial wound infection. When I saw this last week the wound was smaller the surface of the wound looks satisfactory. 1/28; patient missed hyperbarics today. Bone biopsy I did last time showed Enterococcus faecalis and Staphylococcus lugdunensis . He has a wide area of exposed bone. We are going to use silver alginate as of today. I had another ethical discussion with the patient. 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 In this situation I think the likelihood of healing this is low. Therefore I have recommended a ray amputation and with the patient's agreement I have referred him to Dr. Doran Durand. The other issue is that his compliance with hyperbarics has been minimal because of his work schedule and given his underlying decision I am going to stop this today READMISSION 10/24/2019 MRI 09/29/2019 left foot IMPRESSION: 1. Apparent skin ulceration inferior and lateral to the 5th metatarsal base with underlying heterogeneous T2 signal and enhancement in the subcutaneous fat. Small peripherally enhancing fluid collections along the plantar and lateral aspects of the 5th metatarsal base suspicious for abscesses. 2. Interval amputation through the mid 5th metatarsal with nonspecific low-level marrow edema and enhancement. Given the proximity to the adjacent soft tissue inflammatory changes, osteomyelitis cannot be excluded. 3. The additional bones appear unremarkable. MRI 09/29/2019 right foot IMPRESSION: 1. Soft tissue ulceration lateral to the 5th MTP joint. There is low-level T2 hyperintensity within the 4th and 5th metatarsal heads and adjacent proximal phalanges without abnormal T1 signal or cortical destruction. These findings are nonspecific and could be seen with early marrow edema, hyperemia or early osteomyelitis.  No evidence of septic joint. 2. Mild tenosynovitis and synovial enhancement associated with the extensor digitorum tendons at the level of the midfoot. 3. Diffuse low-level muscular T2 hyperintensity and enhancement, most consistent with diabetic myopathy. LEFT FOOT BONE Methicillin resistant staphylococcus aureus Staphylococcus lugdunensis MIC MIC CIPROFLOXACIN >=8 RESISTANT Resistant <=0.5 SENSI... Sensitive CLINDAMYCIN <=0.25 SENS... Sensitive >=8 RESISTANT Resistant ERYTHROMYCIN >=8 RESISTANT Resistant >=8 RESISTANT Resistant [name], Max ([id number]) [id number] Page 12 of 20 GENTAMICIN <=0.5 SENSI... Sensitive <=0.5 SENSI... Sensitive Inducible Clindamycin NEGATIVE Sensitive NEGATIVE Sensitive OXACILLIN >=4 RESISTANT Resistant 2 SENSITIVE Sensitive RIFAMPIN <=0.5 SENSI... Sensitive <=0.5 SENSI... Sensitive TETRACYCLINE <=1 SENSITIVE Sensitive <=1 SENSITIVE Sensitive TRIMETH/SULFA <=10 SENSIT Sensitive <=10 SENSIT Sensitive ... Marland Kitchen.. VANCOMYCIN 1 SENSITIVE Sensitive <=0.5 SENSI... Sensitive Right foot bone . Component 3 wk ago Specimen Description BONE Special Requests RIGHT 4 METATARSAL SAMPLE B Gram Stain NO WBC SEEN NO ORGANISMS SEEN Culture RARE METHICILLIN RESISTANT STAPHYLOCOCCUS AUREUS NO ANAEROBES ISOLATED Performed at [facility], [place] [address], [place], [place] [address] Report Status 10/08/2019 FINAL Organism ID, Bacteria METHICILLIN RESISTANT STAPHYLOCOCCUS AUREUS Resulting Agency [facility] Susceptibility Methicillin resistant staphylococcus aureus MIC CIPROFLOXACIN >=8 RESISTANT Resistant CLINDAMYCIN <=0.25 SENS... Sensitive ERYTHROMYCIN >=8 RESISTANT Resistant GENTAMICIN <=0.5 SENSI... Sensitive Inducible Clindamycin NEGATIVE Sensitive OXACILLIN >=4 RESISTANT Resistant RIFAMPIN <=0.5 SENSI... Sensitive TETRACYCLINE <=1 SENSITIVE  Sensitive TRIMETH/SULFA <=10 SENSIT Sensitive ... VANCOMYCIN 1 SENSITIVE  Sensitive This is a patient we had in clinic earlier this year with a wound over his left fifth metatarsal head. 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 Doran Durand on 05/16/2019. He seems to have developed problems with wounds on his bilateral feet in June 2021 although he may have had problems earlier than this. He was in an urgent care with a right foot ulcer on 09/26/2019 and given a course of doxycycline. This was apparently after having trouble getting into see orthopedics. He was seen by podiatry on 09/28/2019 noted to have bilateral lower extremity ulcers including the left lateral fifth metatarsal base and the right subfifth met head. It was noted that had purulent drainage at that time. He required hospitalization from 6/20 through 7/2. This was because of worsening right foot wounds. He underwent bilateral operative incision and drainage and bone biopsies bilaterally. Culture results are listed above. He has been referred back to clinic by Dr. Jacqualyn Posey of podiatry. He is also followed by Dr. Megan Salon who saw him yesterday. He was discharged from hospital on Zyvox Flagyl and Levaquin and yesterday changed to doxycycline Flagyl and Levaquin. His inflammatory markers on 6/26 showed a sedimentation rate of 129 and a C-reactive protein of 5. This is improved to 14 and 1.3 respectively. This would indicate improvement. ABIs in our clinic today were 1.23 on the right and 1.20 on the left 11/01/2019 on evaluation today patient appears to be doing fairly well in regard to the wounds on his feet at this point. Fortunately there is no signs of active infection at this time. No fevers, chills, nausea, vomiting, or diarrhea. He  currently is seeing infectious disease and still under their care at this point. Subsequently he also has both wounds which she has not been using collagen on as he did not receive that in his packaging he did not call us and let us know that. Apparently that just was missed on the order. Nonetheless we will get that straightened out today. 8/9-Patient returns for bilateral foot wounds, using Prisma with hydrogel moistened dressings, and the wounds appear stable. Patient using surgical shoes, avoiding much pressure or weightbearing as much as possible 8/16; patient has bilateral foot wounds. 1 on the right lateral foot proximally the other is on the left mid lateral foot. Both required debridement of callus and thick skin around the wounds. We have been using silver collagen 8/27; patient has bilateral lateral foot wounds. The area on the left substantially surrounded by callus and dry skin. This was removed from the wound edge. The underlying wound is small. The area on the right measured somewhat smaller today. 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 Unfortunately I did not update his antibiotics during today's visit. 9/10 I reviewed Dr. Hale Bogus last notes he felt he had completed antibiotics his inflammatory markers were reasonably well controlled. He has a small wound on the lateral left foot and a tiny area on the right which is just above closed. He is using Hydrofera Blue with border foam he has bilateral surgical shoes 9/24; 2 week f/u. doing well. right foot is closed. left foot still undermined.  10/14; right foot remains closed at the fifth met head. The area over the base of the left fifth metatarsal has a small open area but considerable undermining towards the plantar foot. Thick callus skin around this suggests an adequate pressure relief. We have talked about this. He says he is going to go back into his cam boot.  I suggested a total contact cast he did not seem enamored with this suggestion 10/26; left foot base of the fifth metatarsal. Same condition as last time. He has skin over the area with an open wound however the skin is not adherent. He went to see Dr. Earleen Newport who did an x-ray and culture of his foot I have not reviewed the x-ray but the patient was not told anything. He is on doxycycline 11/11; since the patient was last here he was in the emergency room on 10/30 he was concerned about swelling in the left foot. They did not do any cultures or x-rays. They changed his antibiotics to cephalexin. Previous culture showed group B strep. The cephalexin is appropriate as doxycycline has less than predictable coverage. Arrives in clinic today with swelling over this area under the wound. He also has a new wound on the right fifth metatarsal head 11/18; the patient has a difficult wound on the lateral aspect of the left fifth metatarsal head. The wound was almost ballotable last week I opened it slightly expecting to see purulence however there was just bleeding. I cultured this this was negative. X-ray unchanged. We are trying to get an MRI but I am not sure were going to be able to get this through his insurance. He also has an area on the right lateral fifth metatarsal head this looks healthier 12/3; the patient finally got our MRI. Surprisingly this did not show osteomyelitis. I did show the soft tissue ulceration at the lateral plantar aspect of the fifth metatarsal base with a tiny residual 6 mm abscess overlying the superficial fascia I have tried to culture this area I have not been able to get this to grow anything. Nevertheless the protruding tissue looks aggravated. I suspect we should try to treat the underlying "abscess with broad-spectrum antibiotics. I am going to start him on Levaquin and Flagyl. He has much less edema in his legs and I am going to continue to wrap his legs and see him  weekly 12/10. I started Levaquin and Flagyl on him last week. He just picked up the Flagyl apparently there was some delay. The worry is the wound on the left fifth metatarsal base which is substantial and worsening. His foot looks like he inverts at the ankle making this a weightbearing surface. Certainly no improvement in fact I think the measurements of this are somewhat worse. We have been using 12/17; he apparently just got the Levaquin yesterday this is 2 weeks after the fact. He has completed the Flagyl. The area over the left fifth metatarsal base still has protruding granulation tissue although it does not look quite as bad as it did some weeks ago. He has severe bilateral lymphedema although we have not been treating him for wounds on his legs this is definitely going to require compression. There was so much edema in the left I did not wish to put him in a total contact cast today. I am going to increase his compression from 3-4 layer. [name], Max ([id number]) [id number] Page 13 of 20 The area on the right lateral fifth met head actually look quite good  and superficial. 12/23; patient arrived with callus on the right fifth met head and the substantial hyper granulated callused wound on the base of his fifth metatarsal. He says he is completing his Levaquin in 2 days but I do not think that adds up with what I gave him but I will have to double check this. We are using Hydrofera Blue on both areas. My plan is to put the left leg in a cast the week after New Year's 04/06/2020; patient's wounds about the same. Right lateral fifth metatarsal head and left lateral foot over the base of the fifth metatarsal. There is undermining on the left lateral foot which I removed before application of total contact cast continuing with Hydrofera Blue new. Patient tells me he was seen by endocrinology today lab work was done [Dr. Kerr]. Also wondering whether he was referred to  cardiology. I went over some lab work from previously does not have chronic renal failure certainly not nephrotic range proteinuria he does have very poorly controlled diabetes but this is not his most updated lab work. Hemoglobin A1c has been over 11 1/10; the patient had a considerable amount of leakage towards mid part of his left foot with macerated skin however the wound surface looks better the area on the right lateral fifth met head is better as well. I am going to change the dressing on the left foot under the total contact cast to silver alginate, continue with Hydrofera Blue on the right. 1/20; patient was in the total contact cast for 10 days. Considerable amount of drainage although the skin around the wound does not look too bad on the left foot. The area on the right fifth metatarsal head is closed. Our nursing staff reports large amount of drainage out of the left lateral foot wound 1/25; continues with copious amounts of drainage described by our intake staff. PCR culture I did last week showed E. coli and Enterococcus faecalis and low quantities. Multiple resistance genes documented including extended spectrum beta lactamase, MRSA, MRSE, quinolone, tetracycline. The wound is not quite as good this week as it was 5 days ago but about the same size 2/3; continues with copious amounts of malodorous drainage per our intake nurse. The PCR culture I did 2 weeks ago showed E. coli and low quantities of Enterococcus. There were multiple resistance genes detected. I put Neosporin on him last week although this does not seem to have helped. The wound is slightly deeper today. Offloading continues to be an issue here although with the amount of drainage she has a total contact cast is just not going to work 2/10; moderate amount of drainage. Patient reports he cannot get his stocking on over the dressing. I told him we have to do that the nurse gave him suggestions on how to make this work. The  wound is on the bottom and lateral part of his left foot. Is cultured predominantly grew low amounts of Enterococcus, E. coli and anaerobes. There were multiple resistance genes detected including extended spectrum beta lactamase, quinolone, tetracycline. I could not think of an easy oral combination to address this so for now I am going to do topical antibiotics provided by [facility] I think the main agents here are vancomycin and an aminoglycoside. We have to be able to give him access to the wounds to get the topical antibiotic on 2/17; moderate amount of drainage this is unchanged. He has his Keystone topical antibiotic against the deep tissue culture organisms. He has  been using this and changing the dressing daily. Silver alginate on the wound surface. 2/24; using Keystone antibiotic with silver alginate on the top. He had too much drainage for a total contact cast at one point although I think that is improving and I think in the next week or 2 it might be possible to replace a total contact cast I did not do this today. In general the wound surface looks healthy however he continues to have thick rims of skin and subcutaneous tissue around the wide area of the circumference which I debrided 06/04/2020 upon evaluation today patient appears to be doing well in regard to his wound. I do feel like he is showing signs of improvement. There is little bit of callus and dead tissue around the edges of the wound as well as what appears to be a little bit of a sinus tract that is off to the side laterally I would perform debridement to clear that away today. 3/17; left lateral foot. The wound looks about the same as I remember. Not much depth surface looks healthy. No evidence of infection 3/25; left lateral foot. Wound surface looks about the same. Separating epithelium from the circumference. There really is no evidence of infection here however not making progress by my view 3/29; left lateral foot.  Surface of the wound again looks reasonably healthy still thick skin and subcutaneous tissue around the wound margins. There is no evidence of infection. One of the concerns being brought up by the nurses has again the amount of drainage vis--vis continued use of a total contact cast 4/5; left lateral foot at roughly the base of the fifth metatarsal. Nice healthy looking granulated tissue with rims of epithelialization. The overall wound measurements are not any better but the tissue looks healthy. The only concern is the amount of drainage although he has no surrounding maceration with what we have been doing recently to absorb fluid and protect his skin. He also has lymphedema. 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 He states that the kerlix/coban under the TCC rolled down and could not get it back up. He has been using an offloading scooter and has somehow hurt his right foot using this device. This happened last week. He states that the side of his right foot developed a blister and opened. The top of his foot also has a few small open wounds he thinks is due to his socks rubbing in his shoes. He has not been using any dressings to the wound. He denies purulent drainage, fever/chills or erythema to the wounds. 4/22; patient presents for 1 week follow-up. He developed new wounds to the right foot that were evaluated at last clinic visit. He continues to have a total contact cast to the left leg and he reports no issues. He has been using silver collagen to the right foot wounds with no issues. He denies purulent drainage, fever/chills or erythema to the right foot wounds. He has no complaints today 4/25; patient presents for 1 week follow-up. He has  a total contact cast of the left leg and reports no issues. He has been using silver alginate to the right foot wound. He denies purulent drainage, fever/chills or erythema to the right  foot wounds. 5/2 patient presents for 1 week follow-up. T contact cast on the left. The wound which is on the base of the plantar foot at the base of the fifth metatarsal otal actually looks quite good and dimensions continue to gradually contract. HOWEVER the area on the right lateral fifth metatarsal head is much larger than what I remember from 2 weeks ago. Once more is he has significant levels of hypergranulation. Noteworthy that he had this same hyper granulated response on his wound on the left foot at one point in time. So much so that he I thought there was an underlying fluid collection. Based on this I think this just needs debridement. 5/9; the wound on the left actually continues to be gradually smaller with a healthy surface. Slight amount of drainage and maceration of the skin around but not too bad. However he has a large wound over the right fifth metatarsal head very much in the same configuration as his left foot wound was initially. I used silver nitrate to address the hyper granulated tissue no mechanical debridement 5/16; area on the left foot did not look as healthy this week deeper thick surrounding macerated skin and subcutaneous tissue. oo The area on the right foot fifth met head was about the same oo The area on the right ankle that we identified last week is completely broken down into an open wound presumably a stocking rubbing issue 5/23; patient has been using a total contact cast to the left side. He has been using silver alginate underneath. He has also been using silver alginate to the right foot wounds. He has no complaints today. He denies any signs of infection. 5/31; the left-sided wound looks some better measure smaller surface granulation looks better. We have been using  silver alginate under the total contact cast oo The large area on his right fifth met head and right dorsal foot look about the same still using silver alginate 6/6; neither side is good as I was hoping although the surface area dimensions are better. A lot of maceration on his left and right foot around the wound edge. Area on the dorsal right foot looks better. He says he was traveling. I am not sure what does the amount of maceration around the plantar wounds may be drainage issues 6/13; in general the wound surfaces look quite good on both sides. Macerated skin and raised edges around the wound required debridement although in general especially on the left the surface area seems improved. oo The area on the right dorsal ankle is about the same I thought this would not be such a problem to close 6/20; not much change in either wound although the one on the right looks a little better. Both wounds have thick macerated edges to the skin requiring debridements. We have been using silver alginate. The area on the dorsal right ankle is still open I thought this would be closed. 6/28; patient comes in today with a marked deterioration in the right foot wound fifth met head. Wide area of exposed bone this is a drastic change from last time. The area on the left there we have been casting is stagnant. We have been using silver alginate in both wound areas. 7/5; bone culture I did for PCR last time was positive for Pseudomonas, group B strep, Enterococcus and Staph aureus. There was no suggestion of methicillin resistance or ampicillin resistant genes. This was resistant to tetracycline however [name], Max ([id number]) [id number] Page 14 of 20  He comes into the clinic today with the area over his right plantar fifth metatarsal head which had been doing so well 2 weeks ago completely necrotic feeling bone. I do not know that this is going to be salvageable. The left foot  wound is certainly no smaller but it has a better surface and is superficial. 7/8; patient called in this morning to say that his total contact cast was rubbing against his foot. He states he is doing fine overall. He denies signs of infection. 7/12; continued deterioration in the wound over the right fifth metatarsal head crumbling bone. This is not going to be salvageable. The patient agrees and wants to be referred to Dr. Doran Durand which we will attempt to arrange as soon as possible. I am going to continue him on antibiotics as long as that takes so I will renew those today. The area on the left foot which is the base of the fifth metatarsal continues to look somewhat better. Healthy looking tissue no depth no debridement is necessary here. 7/20; the patient was kindly seen by Dr. Doran Durand of orthopedics on 10/19/2020. 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 Towards this end we have taken him out of the total contact cast on the left we will put him in a wrap with Hydrofera Blue. As I understand things surgery is planned for 7/21 7/27; patient had his surgery last Thursday. 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 Apparently everything went well we did not still disturb that today The area on the left foot actually looks quite good. He has been much less mobile which probably explains this he did not seem to do well in the total contact cast secondary to drainage and maceration I think. 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 Fortunately there is no evidence of active infection at this time. No fevers, chills, nausea, vomiting, or diarrhea. Overall I think that he is actually doing extremely well. Nonetheless I do believe that he is staying off of this more following the surgery in his right  foot that is the reason the left is doing so great. 8/16; left plantar foot wound. This looks smaller than the last time I saw this he is using Hydrofera Blue. The surgical wound on the right foot is being followed by Dr. Doran Durand we did not look at this today. He has surgical shoes on both feet 8/23; left plantar foot wound not as good this week. Surrounding macerated skin and subcutaneous tissue everything looks moist and wet. I do not think he is offloading this adequately. He is using a surgical shoe Apparently the right foot surgical wound is not open although I did not check his foot 8/31; left plantar foot lateral aspect. Much improved this week. He has no maceration. Some improvement in the surface area of the wound but most impressively the depth is come in we are using silver alginate. The patient is a Product/process development scientist. He is asked that we write him a letter so he can go back to work. I have also tried to see if we can write something that will allow him to limit the amount of time that he is on his foot at work. 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 Hopefully the latter can be avoided 9/6; I believe the patient missed an appointment last week. He arrives in today with a wound looking roughly the same certainly no better. Undermining laterally and also inferiorly. We used molecuLight today in training with the patient's permission.. We are using silver alginate 9/21 wound is measuring bigger this week although this may have to do with the aggressive circumferential debridement last week in response to the blush fluorescence on the MolecuLight. Culture I did last week showed significant MSSA and E. coli. I put him on Augmentin but he has not started it yet. We are also going to send this for compounded antibiotics at [facility]. There is no evidence of systemic infection 9/29; silver alginate. His Keystone  arrived. He is completing Augmentin in 2 days. Offloading in a cam boot. Moderate drainage per our intake staff 10/5; using silver alginate. He has been using his [place]. He has completed his Augmentin. Per our intake nurse still a lot of drainage, far too much to consider a total contact cast. Wound measures about the same. He had the same undermining area that I defined last week from a roughly 11-3. I remove this today 10/12; using silver alginate he is using the [place]. He comes in for a nurse visit hence we are applying [employer] twice a week. Measuring slightly better today and less notable drainage. Extensive debridement of the wound edge last time 10/18; using topical Keystone and silver alginate and a soft cast. Wound measurements about the same. Drainage was through his soft cast. We are changing this twice a week Tuesdays and Friday 10/25; comes in with moderate drainage. Still using Keystone silver alginate and a soft cast. Wound dimensions completely the same.He has a lot of edema in the left leg he has lymphedema. Asking for [name] to consider wrapping him as he cannot get his stocking on over the soft cast 11/2; comes in with moderate to large drainage slightly smaller in terms of width we have been using [place]. His wound looks satisfactory but not much improvement 11/4; patient presents today for obligatory cast change. Has no issues or complaints today. He denies signs of infection. 11/9; patient traveled this weekend to [place], was on the cast quite a bit. Staining of the cast with black material from his walking boot. Drainage was not quite as bad as we feared. Using silver alginate and Keystone 11/16; we do not have size for cast therefore we have been putting a soft cast on him since the change on Friday. Still a significant amount of drainage necessitating changing twice a week. We have been using the Keystone at cast changes either hard or soft as well as silver alginate Comes  in the clinic with things actually looking fairly good improvement in width. He says his offloading is about the same 02/24/2021 upon evaluation today patient actually comes back in and is doing excellent in regard to his foot ulcer this is significantly smaller even compared to the last visit. The soft cast seems to have done extremely well for him which is great news. I do not see any signs of infection minimal debridement will be needed today. 11/30; left lateral foot much improved half a centimeter improvement in surface area. No evidence of infection. He seems to be doing better with the soft cast in the TCC therefore we will continue with this. He comes back in later in the week for  a change with the nurses. This is due to drainage 12/6; no improvement in dimensions. Under illumination some debris on the surface we have been using silver alginate, soft cast. If there is anything optimistic here he seems to have have less drainage 12/13. Dimensions are improved both length and width and slightly in depth. Appears to be quite healthy today. Raised edges of this thick skin and callus around the edges however. He is in a soft cast were bringing him back once for a change on Friday. Drainage is better 12/20. Dimensions are improved. He still has raised edges of thick skin and callus around the edges. We are using a soft cast 12/28; comes in today with thick callus around the wound. Using silver under alginate under a soft cast. I do not think there is much improvement in any measurement 2023 04/06/2021; patient was put in a total contact cast. Unfortunately not much change in surface area 1/10; not much different still thick callus and skin around the edge in spite of the total contact cast. This was just debrided last week we have been using the [facility] compounded antibiotic and silver alginate under a total contact cast 1/18 the patient's wound on the left side is doing nicely. smaller HOWEVER he  comes in today with a wound on the right foot laterally. blister most likely serosangquenous drainage 1/24; the patient continues to do well in terms of the plantar left foot which is continued to contract using silver alginate under the total contact cast HOWEVER the right lateral foot is bigger with denuded skin around the edges. I used pickups and a #15 scalpel to remove this this looks like the remanence of a large blister. Cannot rule out infection. Culture in this area I did last week showed Staphylococcus lugdunensis few colonies. I am going to try to address this [name], Max ([id number]) [id number] Page 15 of 20 with his [employer] antibiotic that is done so well on the left having linezolid and this should cover the staph 2/1; the patient's wound on his left foot which was the original plantar foot wound thick skin and eschar around the edges even in the total contact cast but the wound surface does not look too bad The real problem is on how his right lateral foot at roughly the base of the fifth metatarsal. The wound is completely necrotic more worrisome than that there is swelling around the edges of this. We have been using silver alginate on both wounds and Keystone on the right foot. Unfortunately I think he is going to require systemic antibiotics while we await cultures. He did not get the x-ray done that we ordered last week [lost the prescription 2/7; disappointingly in the area on the left foot which we are treating with a total contact cast is still not closed although it is much smaller. He continues to have a lot of callus around the wound edge. -Right lateral foot culture I did last week was negative x-ray also negative for osteomyelitis. 2/15: TCC silver alginate on the left and silver alginate on the right lateral. No real improvement in either area 05/26/2021: T oday, the wounds are roughly the same size as at his previous visit,  post-debridement. He continues to endorse fairly substantial drainage, particularly on the right. He has been in a total contact cast on the left. There is still some callus surrounding this lesion. On the right, the periwound skin is quite macerated, along with surrounding callus. The center of the right-sided  wound also has some dark, densely adherent material, which is very difficult to remove. 06/02/2021: Today, both wounds are slightly smaller. He has been using zinc oxide ointment around the right ulcer and the degree of maceration has improved markedly. There continues to be an area of nonviable tissue in the center of the right sided ulcer. The left-sided wound, which has been in the total contact cast. Appears clean and the degree of callus around it is less than previously. 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 He had to take the stairs and both wounds have increased in size. The left foot, which has been in a total contact cast, has developed a tunnel tracking to the lateral aspect of his foot. The nonviable tissue in the center of the right-sided ulcer remains recalcitrant to debridement. There is significant undermining surrounding the entirety of the left sided wound. 06/16/2021: The elevator at school has been fixed and the patient has been able to avoid putting as much weight on his wounds over the past week. We converted the left foot wound into a single lesion today, but despite this, the wound is actually smaller. The base is healthy with limited periwound callus. On the right, the central necrotic area is still present. He continues to be quite macerated around the right sided wound, despite applying barrier cream. This does, however, have the benefit of softening the callus to make it more easily removable. 06/23/2021: Today, the left wound is smaller. The lateral aspect that had opened up previously is now closed.  The wound base has a healthy bed of granulation tissue and minimal slough. Unfortunately, on the right, the wound is larger and continues to be fairly macerated. He has also reopened the wound at his right ankle. He thinks this is due to the gait he has adopted secondary to his total contact cast and boot. 06/30/2021: T oday, both wounds are a little bit larger. The lateral aspect on the left has remained closed. He continues to have significant periwound maceration. The culture that I took from the right sided wound grew a population of bacteria that is not covered by his current [facility] antibiotic. The center of the right- sided wound continues to appear necrotic with nonviable fat. It probes deeper today, but does not reach bone. 07/07/2021: The periwound maceration is a little bit less today. The right lateral foot wound has some areas that appear more viable and the necrotic center also looks a little bit better. The wound on the dorsal surface of his right foot near the ankle is contracting and the surface appears healthy. The left plantar wound surface looks healthy, but there is some new undermining on the medial portion. He did get his new Keystone antibiotic and began applying that to the right foot wound on Saturday. 07/14/2021: The intake nurse reported substantial drainage from his wounds, but the periwound skin actually looks better than is typical for him. The wound on the dorsal surface of his right foot near the ankle is smaller and just has a small open area underneath some dried eschar. The left plantar wound surface looks healthy and there has been no significant accumulation of callus. The right lateral foot wound looks quite a bit better, with the central portion, which typically appears necrotic, looking more viable albeit pale. 07/22/2021: His left foot is extremely macerated today. The wound is about the same size. The wound on the dorsal surface of his  right foot near the ankle  had closed, but he traumatized it removing the dressing and there is a tiny skin tear in that location. The right lateral foot wound is bigger, but the surface appears healthy. 07/30/2021: The wound on the dorsal surface of his right foot near the ankle is closed. The right lateral foot wound again is a little bit bigger due to some undermining. The periwound skin is in better condition, however. He has been applying zinc oxide. The wound surface is a little bit dry today. On the left, he does not have the substantial maceration that we frequently see. The wound itself is smaller and has a clean surface. 08/06/2021: Both wounds seem to have deteriorated over the past week. The right lateral foot wound has a dry surface but the periwound is boggy.. Overall wound dimensions are about the same. On the left, the wound is about the same size, but there is more undermining present underneath periwound callus. 08/13/2021: The right sided wound looks about the same, but on the left there has been substantial deterioration. The undermining continues to extend under periwound callus. Once this was removed, substantial extension of the wound was present. There is no odor or purulent drainage but clearly the wounds have broken down. 08/20/2021: The wounds look about the same today. He has been out of his total contact cast and has just been changing the dressings using topical Keystone with PolyMem Ag, Kerlix and Ace bandages. The wound on the top of his right ankle has reopened but this is quite small. There was a little bit of purulent material that I expressed when examining this wound. 08/24/2021: After the aggressive debridement I performed at his last visit, the wounds actually look a little bit better today. They are smaller with the exception of the wound on the top of his right ankle which is a little bit bigger as some more skin pulled off when he was changing his dressing. We are using topical Keystone with  PolyMem Ag Kerlix and Ace bandages. 09/02/2021: There has been really no change to any of his wounds. 09/16/2021: The patient was hospitalized last week with nausea, vomiting, and dehydration. He says that while he was in the hospital, his wounds were not really addressed properly. T oday, both plantar foot wounds are larger and the periwound skin is macerated. The wound on the dorsum of his right foot has a scab on the top. The right foot now has a crater where previously he had had nonviable fat. It looks as though this simply died and fell out. The periwound callus is wet. 09/24/2021: His wounds have deteriorated somewhat since his last visit. The wound on the dorsum of his right foot near his ankle is larger and has more nonviable tissue present. The crater in his right foot is even deeper; I cannot quite palpate or probe to bone but I am sure it is close. The wound on his left plantar foot has an odd boggy area in the center that almost feels as though it has fluid within it. He has run out of his topical Keystone antibiotic. We are using silver alginate on his wounds. 09/29/2021: He has developed a new wound on the dorsum of his left foot near his ankle. He says he thinks his wrapping is rubbing in that site. I would concur with this as the wound on his right ankle is larger. The left foot looks about the same. The right foot has the crater that was  present last week. No significant slough accumulation, but his foot remains quite swollen and warm despite oral antibiotic therapy. 10/08/2021: All of his wounds look about the same as last week. He did not start his oral antibiotics that are prescribed until just a couple of days ago; his [employer] compounded antibiotics formula has been changed and he is awaiting delivery of the new recipe. His MRI that was scheduled for earlier this week was canceled as no prior authorization had been obtained; unfortunately the tech responsible sent an email to my old  [place] email, which I no longer use nor have access to. [name], Max ([id number]) [id number] Page 16 of 20 10/18/2021: The wounds on his bilateral dorsal feet near the ankles are both improved. They are smaller and have just some eschar and slough buildup. The left plantar wound has a fair amount of undermining, but the surface is clean. There is some periwound callus accumulation. On the right plantar foot, there is nonviable fat leading to a deep tunnel that tracks towards his dorsal medial foot. There is periwound callus and slough accumulation, as well. His right foot and leg remain swollen as compared to the left. 10/25/2021: The wounds on his bilateral dorsal feet and at the ankles have broken down somewhat. They are little bit larger than last week. The left plantar wound continues to undermine laterally but the surface is clean. The right plantar foot wound shows some decreased depth in the tunnel tracking towards his dorsal medial foot. He has not yet had the Doppler study that I ordered; it sounds like there is some confusion about the scheduling of the procedure. In addition, the MRI was denied and I have taken steps to appeal the denial. 11/24/2021: Since our last visit, Mr. Unangst was admitted to the hospital where an MRI suggested osteomyelitis. He was taken the operating room by podiatry. Bone biopsies were negative for osteomyelitis. They debrided his wounds and applied myriad matrix. He saw them last week and they removed his staples. He is here today to continue his wound healing process. T oday, both of the dorsal foot/ankle wounds are substantially smaller. There is just a little eschar overlying the left sided wound and some eschar and slough on the right. The right plantar foot ulcer has the healthiest surface of granulation tissue that I have seen to date. A portion of the myriad matrix failed to take and was hanging loose. It appears that  myriad morcells were placed into the tunnel closest to the dorsal portion of his foot. These have sloughed off. The left plantar foot ulcer is about the same size, but has a much healthier surface than in the past. Both plantar ulcers have callus and slough accumulation. 12/07/2021: Left dorsal foot/ankle wound is closed. The right dorsal foot/ankle wound is nearly closed and just has a small open area with some eschar and slough. The right plantar foot wound has contracted quite a bit since our last visit. It has a healthy surface with just a little bit of slough accumulation and periwound callus buildup. The left plantar foot wound is about the same size but the surface appears healthy. There is a little slough and periwound callus on this side, as well. 12/15/2021: Both dorsal foot/ankle wounds are closed. The right plantar foot wound is substantially smaller than at our last visit. The tunneling that was present has nearly closed. There is just a little bit of slough buildup. The left plantar foot wound is also a little bit  smaller today. The surface is the healthiest that I have ever seen it. Light slough and periwound callus accumulation on this side. 12/22/2021: The dorsal ankle/foot wounds remain closed. The right plantar foot wound continues to contract. There is still a bit of depth at the lateral portion of the wound but the surface has a good granulated appearance. The wound on the left is about the same size, the central indented portion is still adherent. It also is clean with good granulation tissue present. The periwound skin and callus are a bit macerated, however. 12/29/2021: Both ankle wounds remain closed. The right plantar foot wound is less than half the size that it was last week. There is no depth any longer and the surface has just a little bit of slough and periwound callus. On the left, the wound is not much smaller, but the surface is healthier with good granulation  tissue. There is also a little slough and periwound callus buildup. 01/05/2022: The right plantar foot wound continues to contract and is once again about half the size as it was last week. There is just a little bit of surface slough and periwound callus. On the left, the depth of the wound has decreased and the diameter has started to contract. It is also very clean with just a little slough and biofilm present. 01/12/2022: The right plantar foot wound is smaller again today. There is just some periwound callus and slough accumulation. On the left, there is a fair amount of undermining and the surface is a little boggy, but no other significant change. 01/19/2022: The right plantar wound continues to contract. There is minimal periwound callus accumulation and just a light layer of slough on the surface. On the left, the surface looks better, but there is fairly significant undermining near the 11:00 portion of the wound, aiming towards his great toe. The skin overlying this area is very healthy and has a good fat layer present. No malodor or purulent drainage. 01/26/2022: The right sided wound continues to contract and has just a light layer of slough on the surface. Minimal periwound callus. On the left, he still has substantial undermining and the tissue surface is not as robust as I would like to see. No malodor or purulent drainage, but he did say he had a lot of serous drainage over the weekend. 02/02/2022: The right foot wound is about a quarter of the size that it was at his last visit. There is just a little slough on the surface and some periwound callus. On the left, the undermining persists and the tissue is still more pale, but he does not seem to have had as much drainage over the last weekend. We are waiting on a wound VAC. 02/09/2022: His right foot has healed. He received the wound VAC, but did not bring it to clinic with him today. The lateral aspect of the left foot wound has come  in a little, but he still has substantial undermining on the medial and distal portion of the wound. Still with macerated periwound callus. 02/16/2022: The wound VAC was applied last Friday. T oday, there has been tremendous improvement in the surface of the wound bed. The undermined portion of the wound has come in a bit. There is still some overhanging dead skin. Overall the wound looks much better. 02/23/2022: No significant change in the overall wound dimensions, but the wound surface continues to improve and appears healthier and more robust. There is some periwound callus accumulation and overhanging  skin. No concern for infection. 03/02/2022: Although the measurements taken in clinic today were unchanged, the visual appearance of the wound looks like it is smaller and the undermining/tunneling is filling with flesh. There is less periwound tissue maceration than usual. No malodor or purulent drainage. No concern for infection. 03/09/2022: The undermining has come in by couple of millimeters. The periwound is a little bit more macerated this week. No concern for infection. 12/13; substantial wound on the left plantar foot. We are using silver collagen and a wound VAC. 03/23/2022: The undermining continues to contract. The wound surface is a little bit dry. 03/30/2022: The undermining has essentially resolved. There is still some depth at the center of the wound. The wound surface has good beefy tissue and the moisture balance is better. Patient History Information obtained from Patient. Family History No family history of Cancer, Diabetes, Hereditary Spherocytosis, Hypertension, Kidney Disease, Lung Disease, Seizures, Stroke, Thyroid Problems, Tuberculosis. Social History Never smoker, Marital Status - Single, Alcohol Use - Rarely, Drug Use - No History, Caffeine Use - Never. Medical History Eyes Denies history of Cataracts, Glaucoma, Optic Neuritis Ear/Nose/Mouth/Throat Denies history of  Chronic sinus problems/congestion, Middle ear problems Hematologic/Lymphatic Reisner, Max ([id number]) [id number] Page 17 of 20 Denies history of Anemia, Hemophilia, Human Immunodeficiency Virus, Lymphedema, Sickle Cell Disease Respiratory Denies history of Aspiration, Asthma, Chronic Obstructive Pulmonary Disease (COPD), Pneumothorax, Sleep Apnea, Tuberculosis Cardiovascular Denies history of Angina, Arrhythmia, Congestive Heart Failure, Coronary Artery Disease, Deep Vein Thrombosis, Hypertension, Hypotension, Myocardial Infarction, Peripheral Arterial Disease, Peripheral Venous Disease, Phlebitis, Vasculitis Gastrointestinal Denies history of Cirrhosis , Colitis, Crohnoos, Hepatitis A, Hepatitis B, Hepatitis C Endocrine Patient has history of Type II Diabetes Denies history of Type I Diabetes Immunological Denies history of Lupus Erythematosus, Raynaudoos, Scleroderma Integumentary (Skin) Denies history of History of Burn Musculoskeletal Denies history of Gout, Rheumatoid Arthritis, Osteoarthritis, Osteomyelitis Neurologic Denies history of Dementia, Neuropathy, Quadriplegia, Paraplegia, Seizure Disorder Oncologic Denies history of Received Chemotherapy, Received Radiation Psychiatric Denies history of Anorexia/bulimia, Confinement Anxiety Hospitalization/Surgery History - 11/1-11/06/2018- sepsis foot infection. - 11/4-11/5 02 sats low respiratory distress. Objective Constitutional Slightly hypertensive. No acute distress. Vitals Time Taken: 8:37 AM, Height: 77 in, Weight: 280 lbs, BMI: 33.2, Temperature: 98.3 F, Pulse: 86 bpm, Respiratory Rate: 18 breaths/min, Blood Pressure: 142/93 mmHg, Capillary Blood Glucose: 114 mg/dl. Respiratory Normal work of breathing on room air. General Notes: 03/30/2022: The undermining has essentially resolved. There is still some depth at the center of the wound. The wound surface has good beefy tissue and the  moisture balance is better. Integumentary (Hair, Skin) Wound #3 status is Open. Original cause of wound was Trauma. The date acquired was: 10/02/2019. The wound has been in treatment 126 weeks. The wound is located on the [place]. The wound measures 3cm length x 3.3cm width x 1.4cm depth; 7.775cm^2 area and 10.886cm^3 volume. There is Fat Layer (Subcutaneous Tissue) exposed. There is no tunneling noted, however, there is undermining starting at 4:00 and ending at 6:00 with a maximum distance of 0.6cm. There is a medium amount of serosanguineous drainage noted. The wound margin is distinct with the outline attached to the wound base. There is large (67-100%) red granulation within the wound bed. There is a small (1-33%) amount of necrotic tissue within the wound bed including Adherent Slough. The periwound skin appearance had no abnormalities noted for color. The periwound skin appearance exhibited: Callus, Maceration. Periwound temperature was noted as No Abnormality. 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 - Dr. [name] - Room 1 with [facility] Shower/ Hygiene: May shower and wash wound with soap and water. Negative Presssure Wound Therapy: Wound #3 Left,Lateral,Plantar Foot: Wound Vac to wound continuously at 152m/hg pressure Black Foam Edema Control - Lymphedema / SCD / Other: Hornaday, CMali([id number] Page 18 of 20 Avoid standing for long periods of time. Exercise regularly Compression stocking or Garment 20-30 mm/Hg pressure to: - to both legs daily Off-Loading: Open toe surgical shoe to: - Both feet Additional Orders / Instructions: Follow Nutritious Diet Non Wound Condition: Apply the following to affected area as directed: - continue to  apply foam donut or cushion to healed right plantar foot WOUND #3: - Foot Wound Laterality: Plantar, Left, Lateral Cleanser: Soap and Water 3 x Per Week/30 Days Discharge Instructions: May shower and wash wound with dial antibacterial soap and water prior to dressing change. Peri-Wound Care: Skin Prep (Generic) 3 x Per Week/30 Days Discharge Instructions: Use skin prep as directed Prim Dressing: Promogran Prisma Matrix, 4.34 (sq in) (silver collagen) (Generic) 3 x Per Week/30 Days ary Discharge Instructions: Moisten collagen with saline or hydrogel Prim Dressing: VAC 3 x Per Week/30 Days ary Secured With: Elastic Bandage 4 inch (ACE bandage) (Generic) 3 x Per Week/30 Days Discharge Instructions: Secure with ACE bandage as directed. Secured With: 6M Medipore SPublic affairs consultantSurgical T 2x10 (in/yd) (Generic) 3 x Per Week/30 Days ape Discharge Instructions: Secure with tape as directed. Com pression Wrap: Kerlix Roll 4.5x3.1 (in/yd) (Generic) 3 x Per Week/30 Days Discharge Instructions: Apply Kerlix and Coban compression as directed. 03/30/2022: The undermining has essentially resolved. There is still some depth at the center of the wound. The wound surface has good beefy tissue and the moisture balance is better. No debridement was necessary today. We will continue to use Prisma silver collagen moistened with hydrogel on the wound surface and continue negative pressure wound therapy. Follow-up in 1 week. Electronic Signature(s) Signed: 03/30/2022 8:54:01 AM By: CFredirick MaudlinMD FACS Entered By: CFredirick Maudlinon 03/30/2022 08:54:01 -------------------------------------------------------------------------------- HxROS Details Patient Name: Date of Service: ALewie [name] [name] 03/30/2022 8:15 A M Medical Record Number: [id number] Account Number: [id number] of Birth/Sex: Treating RN: [id number](36y.o. M) Primary Care Provider: PSeward CarolOther Clinician: Referring  Provider: Treating Provider/Extender: COsborn Cohoin Treatment: 126 Information Obtained From Patient Eyes Medical History: Negative for: Cataracts; Glaucoma; Optic Neuritis Ear/Nose/Mouth/Throat Medical History: Negative for: Chronic sinus problems/congestion; Middle ear problems Hematologic/Lymphatic Medical History: Negative for: Anemia; Hemophilia; Human Immunodeficiency Virus; Lymphedema; Sickle Cell Disease Respiratory Medical History: Negative for: Aspiration; Asthma; Chronic Obstructive Pulmonary Disease (COPD); Pneumothorax; Sleep Apnea; Tuberculosis Cardiovascular Pfalzgraf, CMali([id number] Page 19 of 20 Medical History: Negative for: Angina; Arrhythmia; Congestive Heart Failure; Coronary Artery Disease; Deep Vein Thrombosis; Hypertension; Hypotension; Myocardial Infarction; Peripheral Arterial Disease; Peripheral Venous Disease; Phlebitis; Vasculitis Gastrointestinal Medical History: Negative for: Cirrhosis ; Colitis; Crohns; Hepatitis A; Hepatitis B; Hepatitis C Endocrine Medical History: Positive for: Type II Diabetes Negative for: Type I Diabetes Time with diabetes: 8 Treated with: Insulin Blood sugar tested every [name]: No Immunological Medical History: Negative for: Lupus Erythematosus; Raynauds; Scleroderma Integumentary (Skin) Medical History: Negative for: History of Burn Musculoskeletal Medical History: Negative for: Gout; Rheumatoid Arthritis; Osteoarthritis; Osteomyelitis Neurologic Medical History: Negative for: Dementia; Neuropathy; Quadriplegia; Paraplegia; Seizure Disorder Oncologic Medical History: Negative for:  Received Chemotherapy; Received Radiation Psychiatric Medical History: Negative for: Anorexia/bulimia; Confinement Anxiety Immunizations Pneumococcal Vaccine: Received Pneumococcal Vaccination: No Implantable Devices None Hospitalization / Surgery History Type  of Hospitalization/Surgery 11/1-11/06/2018- sepsis foot infection 11/4-11/5 02 sats low respiratory distress Family and Social History Cancer: No; Diabetes: No; Hereditary Spherocytosis: No; Hypertension: No; Kidney Disease: No; Lung Disease: No; Seizures: No; Stroke: No; Thyroid Problems: No; Tuberculosis: No; Never smoker; Marital Status - Single; Alcohol Use: Rarely; Drug Use: No History; Caffeine Use: Never; Financial Concerns: No; Food, Clothing or Shelter Needs: No; Support System Lacking: No; Transportation Concerns: No Electronic Signature(s) Signed: 03/30/2022 12:13:53 PM By: Max Maudlin MD FACS Entered By: [name] on 03/30/2022 08:52:43 [name], Max ([id number]) [id number] Page 20 of 20 -------------------------------------------------------------------------------- SuperBill Details Patient Name: Date of Service: [name] 03/30/2022 Medical Record Number: [id number] Patient Account Number: [id number] Date of Birth/Sex: Treating RN: [birth year] (36 y.o. M) Primary Care Provider: Seward [name] Other Clinician: Referring Provider: Treating Provider/Extender: [name] in Treatment: 126 Diagnosis Coding ICD-10 Codes Code Description [phone] Non-pressure chronic ulcer of other part of left foot with other specified severity E11.621 Type 2 diabetes mellitus with foot ulcer Facility Procedures : CPT4 Code: 03013143 Description: 88875 - WOUND VAC-50 SQ CM OR LESS Modifier: Quantity: 1 Physician Procedures : CPT4 Code Description Modifier [id number] 99214 - WC PHYS LEVEL 4 - EST PT ICD-10 Diagnosis Description L97.528 Non-pressure chronic ulcer of other part of left foot with other specified severity E11.621 Type 2 diabetes mellitus with foot ulcer Quantity: 1 Electronic Signature(s) Signed: 03/30/2022 8:54:13 AM By: Max Maudlin MD FACS Entered By: [name] on 03/30/2022 08:54:12

## 2022-04-01 NOTE — Progress Notes (Signed)
Sciulli, [name] ([id number]) [id number] Page 1 of 8 Visit Report for 03/30/2022 Arrival Information Details Patient Name: Date of Service: [name] 03/30/2022 8:15 A M Medical Record Number: [id number] Patient Account Number: [id number] Date of Birth/Sex: Treating [name]: 06-01-1985 (36 y.o. [name] Primary Care [name]: [name] Other Clinician: Referring [name]: Treating [name]/Extender: [name] in Treatment: 126 Visit Information History Since Last Visit Added or deleted any medications: No Patient Arrived: Ambulatory Had a fall or experienced change in No Arrival Time: 08:30 activities of daily living that may affect Accompanied By: self risk of falls: Transfer Assistance: None Signs or symptoms of abuse/neglect since last visito No Patient Requires Transmission-Based Precautions: No Hospitalized since last visit: No Patient Has Alerts: No Implantable device outside of the clinic excluding No cellular tissue based products placed in the center since last visit: Pain Present Now: No Electronic Signature(s) Signed: 04/01/2022 12:15:24 PM By: [name] Entered By: Max East on 03/30/2022 08:37:02 -------------------------------------------------------------------------------- Encounter Discharge Information Details Patient Name: Date of Service: [name], [name] 03/30/2022 8:15 A M Medical Record Number: [id number] Patient Account Number: [id number] Date of Birth/Sex: Treating [name]: 10-May-1985 (36 y.o. [name] Primary Care Seline Enzor: [name] Other Clinician: Referring Cheyann Blecha: Treating [name]/Extender: [name] in Treatment: 126 Encounter Discharge Information Items Discharge Condition: Stable Ambulatory Status: Ambulatory Discharge Destination: Home Transportation: Private Auto Accompanied By: self Schedule Follow-up Appointment:  Yes Clinical Summary of Care: Electronic Signature(s) Signed: 04/01/2022 12:15:24 PM By: [name] Entered By: Max East on 03/30/2022 09:07:24 [name], [name] ([id number]) [id number] Page 2 of 8 -------------------------------------------------------------------------------- Lower Extremity Assessment Details Patient Name: Date of Service: [name] 03/30/2022 8:15 A M Medical Record Number: [id number] Patient Account Number: [id number] Date of Birth/Sex: Treating [name]: [birth year] (36 y.o. [name] Primary Care Aliena Ghrist: [name] Other Clinician: Referring [name]: Treating [name]/Extender: [name] in Treatment: 126 Edema Assessment Assessed: [name]: No] [Right: No] Edema: [Left: Ye] [Right: s] Calf Left: Right: Point of Measurement: 48 cm From Medial Instep 48 cm Ankle Left: Right: Point of Measurement: 11 cm From Medial Instep 29 cm Vascular Assessment Pulses: Dorsalis Pedis Palpable: [Left:Yes] Electronic Signature(s) Signed: 04/01/2022 12:15:24 PM By: [name] Entered By: Max East on 03/30/2022 08:38:18 -------------------------------------------------------------------------------- Multi Wound Chart Details Patient Name: Date of Service: [name], [name] 03/30/2022 8:15 A M Medical Record Number: [id number] Patient Account Number: [id number] Date of Birth/Sex: Treating [name]: [birth year] (36 y.o. M) Primary Care [name]: [name] Other Clinician: Referring [name]: Treating [name]/Extender: [name] in Treatment: 126 Vital Signs Height(in): 77 Capillary Blood Glucose(mg/dl): 114 Weight(lbs): 280 Pulse(bpm): 86 Body Mass Index(BMI): 33.2 Blood Pressure(mmHg): 142/93 Temperature(F): 98.3 Respiratory Rate(breaths/min): 18 [3:Photos:] [N/A:N/A] Left, Lateral, Plantar Foot N/A N/A Wound Location: Trauma N/A N/A Wounding  Event: Diabetic Wound/Ulcer of the Lower N/A N/A Primary Etiology: Extremity Type II Diabetes N/A N/A Comorbid History: Hallisey, [name] ([id number]) [id number] Page 3 of 8 10/02/2019 N/A N/A Date Acquired: 22 N/A N/A Weeks of Treatment: Open N/A N/A Wound Status: No N/A N/A Wound Recurrence: 3x3.3x1.4 N/A N/A Measurements L x W x [name] 7.775 N/A N/A A (cm) : rea 10.886 N/A N/A Volume (cm) : -371.50% N/A N/A % Reduction in A rea: -6497.60% N/A N/A % Reduction in Volume: 4 Starting Position 1 (o'clock): 6 Ending Position 1 (o'clock): 0.6 Maximum Distance 1 (cm): Yes N/A N/A Undermining: Grade  2 N/A N/A Classification: Medium N/A N/A Exudate A mount: Serosanguineous N/A N/A Exudate Type: red, brown N/A N/A Exudate Color: Distinct, outline attached N/A N/A Wound Margin: Large (67-100%) N/A N/A Granulation A mount: Red N/A N/A Granulation Quality: Small (1-33%) N/A N/A Necrotic A mount: Fat Layer (Subcutaneous Tissue): Yes N/A N/A Exposed Structures: Fascia: No Tendon: No Muscle: No Joint: No Bone: No Small (1-33%) N/A N/A Epithelialization: Callus: Yes N/A N/A Periwound Skin Texture: Maceration: Yes N/A N/A Periwound Skin Moisture: No Abnormalities Noted N/A N/A Periwound Skin Color: No Abnormality N/A N/A Temperature: Negative Pressure Wound Therapy N/A N/A Procedures Performed: Maintenance (NPWT) Treatment Notes Electronic Signature(s) Signed: 03/30/2022 8:51:14 AM By: Max Maudlin MD FACS Entered By: [name] on 03/30/2022 08:51:13 -------------------------------------------------------------------------------- Multi-Disciplinary Care Plan Details Patient Name: Date of Service: [name], [name] 03/30/2022 8:15 A M Medical Record Number: [id number] Patient Account Number: [id number] Date of Birth/Sex: Treating [name]: 1985-06-22 (36 y.o. [name] Primary Care [name]: [name] Other  Clinician: Referring [name]: Treating [name]/Extender: [name] in Treatment: [place] reviewed with physician Active Inactive Nutrition Nursing Diagnoses: Imbalanced nutrition Potential for alteratiion in Nutrition/Potential for imbalanced nutrition Goals: Patient/caregiver agrees to and verbalizes understanding of need to use nutritional supplements and/or vitamins as prescribed Date Initiated: 10/24/2019 Date Inactivated: 04/06/2020 Target Resolution Date: 04/03/2020 Goal Status: Met Patient/caregiver will maintain therapeutic glucose control Date Initiated: 10/24/2019 Target Resolution Date: 04/20/2022 Goal Status: Active Interventions: Pascua, [name] ([id number]) [id number] Page 4 of 8 Assess HgA1c results as ordered upon admission and as needed Assess patient nutrition upon admission and as needed per policy Provide education on elevated blood sugars and impact on wound healing Provide education on nutrition Treatment Activities: Education provided on Nutrition : 12/07/2021 Notes: 11/17/20: Glucose control ongoing issue, target date extended. 01/26/21: Glucose management continues. Wound/Skin Impairment Nursing Diagnoses: Impaired tissue integrity Knowledge deficit related to ulceration/compromised skin integrity Goals: Patient/caregiver will verbalize understanding of skin care regimen Date Initiated: 10/24/2019 Target Resolution Date: 04/20/2022 Goal Status: Active Ulcer/skin breakdown will have a volume reduction of 30% by week 4 Date Initiated: 10/24/2019 Date Inactivated: 01/16/2020 Target Resolution Date: 01/10/2020 Unmet Reason: no change in Goal Status: Unmet measurements. Interventions: Assess patient/caregiver ability to obtain necessary supplies Assess patient/caregiver ability to perform ulcer/skin care regimen upon admission and as needed Assess ulceration(s) every visit Provide  education on ulcer and skin care Notes: 11/17/20: Wound care regimen continues Electronic Signature(s) Signed: 04/01/2022 12:15:24 PM By: [name] Entered By: Max East on 03/30/2022 08:50:31 -------------------------------------------------------------------------------- Negative Pressure Wound Therapy Maintenance (NPWT) Details Patient Name: Date of Service: [name] 03/30/2022 8:15 A M Medical Record Number: [id number] Patient Account Number: [id number] Date of Birth/Sex: Treating [name]: Oct 10, 1985 (36 y.o. [name] Primary Care Lakhia Gengler: [name] Other Clinician: Referring Nashton Belson: Treating Linden Mikes/Extender: [name] in Treatment: 126 NPWT Maintenance Performed for: Wound #3 Left, Lateral, Plantar Foot Performed By: Max East, [name] Type: VAC System Coverage Size (sq cm): 9.9 Pressure Type: Constant Pressure Setting: 125 mmHG Drain Type: None Primary Contact: Other : collagen (prisma) Sponge/Dressing Type: Foam, Black Date Initiated: 02/11/2022 Dressing Removed: No Quantity of Sponges/Gauze Removed: 1 Canister Changed: No Canister Exudate Volume: 15 Dressing Reapplied: No Quantity of Sponges/Gauze Inserted: 1 Respones T Treatment: o tolerated well Days On NPWT : 48 Post Procedure Diagnosis Same as Pre-procedure [place], [name] ([id number]) [id number] Page 5 of 8 Electronic Signature(s) Signed: 04/01/2022 12:15:24 PM By: Willey Blade,  [name] [name] Entered By: Max East on 03/30/2022 08:49:46 -------------------------------------------------------------------------------- Pain Assessment Details Patient Name: Date of Service: [name] 03/30/2022 8:15 A M Medical Record Number: [id number] Patient Account Number: [id number] Date of Birth/Sex: Treating [name]: 1985-11-12 (36 y.o. [name] Primary Care Donyea Beverlin: [name] Other Clinician: Referring Tanishia Lemaster: Treating  Deni Lefever/Extender: [name] in Treatment: 126 Active Problems Location of Pain Severity and Description of Pain Patient Has Paino No Site Locations Rate the pain. Current Pain Level: 0 Pain Management and Medication Current Pain Management: Electronic Signature(s) Signed: 04/01/2022 12:15:24 PM By: [name] Entered By: Max East on 03/30/2022 08:37:40 -------------------------------------------------------------------------------- Patient/Caregiver Education Details Patient Name: Date of Service: [name], [name] 12/27/2023andnbsp8:15 A M Medical Record Number: [id number] Patient Account Number: [id number] Date of Birth/Gender: Treating [name]: 1985/09/01 (36 y.o. [name] Primary Care Physician: [name] Other Clinician: Referring Physician: Treating Physician/Extender: [name] in Treatment: 126 Education Assessment Education Provided To: [place], [name] ([id number]) [id number] Page 6 of 8 Patient Education Topics Provided Wound/Skin Impairment: Methods: Explain/Verbal Responses: Reinforcements needed, State content correctly Electronic Signature(s) Signed: 04/01/2022 12:15:24 PM By: [name] Entered By: Max East on 03/30/2022 08:50:45 -------------------------------------------------------------------------------- Wound Assessment Details Patient Name: Date of Service: [name], [name] 03/30/2022 8:15 A M Medical Record Number: [id number] Patient Account Number: [id number] Date of Birth/Sex: Treating [name]: [birth year] (36 y.o. [name] Primary Care Mckaela Howley: [name] Other Clinician: Referring Nazier Neyhart: Treating Kamica Florance/Extender: [name] in Treatment: 126 Wound Status Wound Number: 3 Primary Etiology: Diabetic Wound/Ulcer of the Lower Extremity Wound Location: Left, Lateral, Plantar Foot Wound Status:  Open Wounding Event: Trauma Comorbid History: Type II Diabetes Date Acquired: 10/02/2019 Weeks Of Treatment: 126 Clustered Wound: No Photos Wound Measurements Length: (cm) 3 Width: (cm) 3.3 Depth: (cm) 1.4 Area: (cm) 7.775 Volume: (cm) 10.886 % Reduction in Area: -371.5% % Reduction in Volume: -6497.6% Epithelialization: Small (1-33%) Tunneling: No Undermining: Yes Starting Position (o'clock): 4 Ending Position (o'clock): 6 Maximum Distance: (cm) 0.6 Wound Description Classification: Grade 2 Wound Margin: Distinct, outline attached Exudate Amount: Medium Exudate Type: Serosanguineous Exudate Color: red, brown Foul Odor After Cleansing: No Slough/Fibrino No Wound Bed Granulation Amount: Large (67-100%) Exposed Structure Granulation Quality: Red Fascia Exposed: No Necrotic Amount: Small (1-33%) Fat Layer (Subcutaneous Tissue) Exposed: Laymon Stockert, [name] ([id number]) [id number] Page 7 of 8 Necrotic Quality: Adherent Slough Tendon Exposed: No Muscle Exposed: No Joint Exposed: No Bone Exposed: No Periwound Skin Texture Texture Color No Abnormalities Noted: No No Abnormalities Noted: Yes Callus: Yes Temperature / Pain Temperature: No Abnormality Moisture No Abnormalities Noted: No Maceration: Yes Treatment Notes Wound #3 (Foot) Wound Laterality: Plantar, Left, Lateral Cleanser Soap and Water Discharge Instruction: May shower and wash wound with dial antibacterial soap and water prior to dressing change. Peri-Wound Care Skin Prep Discharge Instruction: Use skin prep as directed Topical Primary Dressing Promogran Prisma Matrix, 4.34 (sq in) (silver collagen) Discharge Instruction: Moisten collagen with saline or hydrogel VAC Secondary Dressing Secured With Elastic Bandage 4 inch (ACE bandage) Discharge Instruction: Secure with ACE bandage as directed. 104M Medipore Soft Cloth Surgical T 2x10 (in/yd) ape Discharge Instruction:  Secure with tape as directed. Compression Wrap Kerlix Roll 4.5x3.1 (in/yd) Discharge Instruction: Apply Kerlix and Coban compression as directed. Compression Stockings Add-Ons Electronic Signature(s) Signed: 04/01/2022 12:15:24 PM By: [name] Entered By: Max East on 03/30/2022 08:43:57 -------------------------------------------------------------------------------- Vitals Details Patient Name: Date of Service: A  RMSTRO NG, [name] 03/30/2022 8:15 A M Medical Record Number: [id number] Patient Account Number: [id number] Date of Birth/Sex: Treating [name]: [birth year] (36 y.o. [name] Primary Care Glenette Bookwalter: [name] Other Clinician: Referring Blayne Frankie: Treating Garmon Dehn/Extender: [name] in Treatment: 126 Vital Signs Time Taken: 08:37 Temperature (F): 98.3 Height (in): 77 Pulse (bpm): 86 Weight (lbs): 280 Respiratory Rate (breaths/min): 18 Body Mass Index (BMI): 33.2 Blood Pressure (mmHg): 142/93 Pasquarelli, [name] ([id number]) [id number] Page 8 of 8 Capillary Blood Glucose (mg/dl): 114 Reference Range: 80 - 120 mg / dl Electronic Signature(s) Signed: 04/01/2022 12:15:24 PM By: [name] Entered By: Max East on 03/30/2022 08:37:29

## 2022-04-06 ENCOUNTER — Encounter (HOSPITAL_BASED_OUTPATIENT_CLINIC_OR_DEPARTMENT_OTHER): Payer: BC Managed Care – PPO | Attending: General Surgery | Admitting: General Surgery

## 2022-04-06 DIAGNOSIS — L84 Corns and callosities: Secondary | ICD-10-CM | POA: Diagnosis not present

## 2022-04-06 DIAGNOSIS — L97528 Non-pressure chronic ulcer of other part of left foot with other specified severity: Secondary | ICD-10-CM | POA: Diagnosis not present

## 2022-04-06 DIAGNOSIS — E11621 Type 2 diabetes mellitus with foot ulcer: Secondary | ICD-10-CM | POA: Insufficient documentation

## 2022-04-06 NOTE — Progress Notes (Signed)
Zietz, [name] ([id number]) [id number] Page 1 of 20 Visit Report for 04/06/2022 Chief Complaint Document Details Patient Name: Date of Service: [name] D 04/06/2022 8:45 A M Medical Record Number: [id number] Patient Account Number: [id number] Date of Birth/Sex: Treating RN: March 24, 1986 (37 y.o. M) Primary Care Provider: [name] Other Clinician: Referring Provider: Treating Provider/Extender: [name] in Treatment: [place] from: Patient Chief Complaint 01/11/2019; patient is here for review of a rather substantial wound over the left fifth plantar metatarsal head extending into the lateral part of his foot 10/24/2019; patient returns to clinic with wounds on his bilateral feet with underlying osteomyelitis biopsy-proven Electronic Signature(s) Signed: 04/06/2022 9:36:33 AM By: [name] MD FACS Entered By: [name] on 04/06/2022 09:36:33 -------------------------------------------------------------------------------- HPI Details Patient Name: Date of Service: [name], [name] 04/06/2022 8:45 A M Medical Record Number: [id number] Patient Account Number: [id number] Date of Birth/Sex: Treating RN: 07/06/1985 (37 y.o. 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 He comes in for review of a wound over the plantar fifth metatarsal head extending into the lateral part of the foot. He was followed for this previously by his podiatrist Dr. Cornelius Moras. As the patient tells his story he went to see podiatry first for a swelling he developed on the lateral part of his fifth metatarsal head in May. He states this was "open" by podiatry and the  area closed. He was followed up in June and it was again opened callus removed and it closed promptly. There were plans being made for surgery on the fifth metatarsal head in June however his blood sugar was apparently too high for anesthesia. Apparently the area was debrided and opened again in June and it is never closed since. Looking over the records from podiatry I am really not able to follow this. It was clear when he was first seen it was before 5/14 at that point he already had a wound. By 5/17 the ulcer was resolved. I do not see anything about a procedure. On 5/28 noted to have pre-ulcerative moderate keratosis. X-ray noted 1/5 contracted toe and tailor's bunion and metatarsal deformity. On a visit date on 09/28/2018 the dorsal part of the left foot it healed and resolved. There was concern about swelling in his lower extremity he was sent to the ER.. As far as I can tell he was seen in the ER on 7/12 with an ulcer on his left foot. A DVT rule out of the left leg was negative. I do not think I have complete records from podiatry but I am not able to verify the procedures this patient states he had. He states after the last procedure the wound has never closed although I am not able to follow this in the records I have from podiatry. He has not had a recent x-ray The patient has been using Neosporin on the wound. He is wearing a Darco shoe. He is still very active up on his foot working and exercising. Past medical history; type 2 diabetes ketosis-prone, leg swelling with a negative DVT study in July. Non-smoker ABI in our clinic was 0.85 on the left 10/16; substantial wound on the plantar left fifth met head extending laterally almost to the dorsal fifth MTP. We have been using silver  alginate we gave him a Darco forefoot off loader. An x-ray did not show evidence of osteomyelitis did note soft tissue emphysema which I think was due to gas tracking through an open wound. There is no doubt in  my mind he requires an MRI 10/23; MRI not booked until 3 November at the earliest this is largely due to his glucose sensor in the right arm. We have been using silver alginate. There has been an improvement 10/29; I am still not exactly sure when his MRI is booked for. He says it is the third but it is the 10th in epic. This definitely needs to be done. He is running a low-grade fever today but no other symptoms. No real improvement in the 1 [name] ([id number]) [id number] Page 2 of 20 02/26/2019 patient presents today for a follow-up visit here in our clinic he is last been seen in the clinic on October 29. Subsequently we were working on getting MRI to evaluate and see what exactly was going on and where we would need to go from the standpoint of whether or not he had osteomyelitis and again what treatments were going be required. Subsequently the patient ended up being admitted to the hospital on 02/07/2019 and was discharged on 02/14/2019. This is a somewhat interesting admission with a discharge diagnosis of pneumonia due to COVID-19 although he was positive for COVID-19 when tested at the urgent care but negative x2 when he was actually in the hospital. With that being said he did have acute respiratory failure with hypoxia and it was noted he also have a left foot ulceration with osteomyelitis. With that being said he did require oxygen for his pneumonia and I level 4 L. He was placed on antivirals and steroids for the COVID-19. He was also transferred to the [facility] at one point. Nonetheless he did subsequently discharged home and since being home has done much better in that regard. The CT angiogram did not show any pulmonary embolism. With regard to the osteomyelitis the patient was placed on vancomycin and Zosyn while in the hospital but has been changed to Augmentin at discharge. It was also recommended that he follow- up with wound care and  podiatry. Podiatry however wanted him to see [name] according to the patient prior to them doing anything further. His hemoglobin A1c was 9.9 as noted in the hospital. Have an MRI of the left foot performed while in the hospital on 02/04/2019. This showed evidence of septic arthritis at the fifth MTP joint and osteomyelitis involving the fifth metatarsal head and proximal phalanx. There is an overlying plantar open wound noted an abscess tracking back along the lateral aspect of the fifth metatarsal shaft. There is otherwise diffuse cellulitis and mild fasciitis without findings of polymyositis. The patient did have recently pneumonia secondary to COVID-19 I looked in the chart through epic and it does appear that the patient may need to have an additional x-ray just to ensure everything is cleared and that he has no airspace disease prior to putting him into the [name]. 03/05/2019; patient was readmitted to the clinic last week. He was hospitalized twice for a viral upper respiratory tract infection from 11/1 through 11/4 and then 11/5 through 11/12 ultimately this turned out to be Covid pneumonitis. Although he was discharged on oxygen he is not using it. He says he feels fine. He has no exercise limitation no cough no sputum. His O2 sat in our clinic today was 100% on room  air. 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 He received Vanco and Zosyn in the hospital and then was discharged on 2 weeks of Augmentin. I do not see any relevant cultures. He was supposed to follow-up with infectious disease but I do not see that he has an appointment. 12/8; patient saw Dr. Novella Olive of infectious disease last week. He felt that he had had adequate antibiotic therapy. He did not go to follow-up with Dr. Amalia Hailey of podiatry and I have again talked to him about the pros and cons of this. He does not want to  consider a ray amputation of this time. He is aware of the risks of recurrence, migration etc. He started HBO today and tolerated this well. He can complete the Augmentin that I gave him last week. I have looked over the lab work that Dr. Chana Bode ordered his C-reactive protein was 3.3 and his sedimentation rate was 17. The C-reactive protein is never really been measurably that high in this patient 12/15; not much change in the wound today however he has undermining along the lateral part of the foot again more extensively than last week. He has some rims of epithelialization. We have been using silver alginate. He is undergoing hyperbarics but did not dive today 12/18; in for his obligatory first total contact cast change. Unfortunately there was pus coming from the undermining area around his fifth metatarsal head. This was cultured but will preclude reapplication of a cast. He is seen in conjunction with HBO 12/24; patient had staph lugdunensis in the wound in the undermining area laterally last time. We put him on doxycycline which should have covered this. The wound looks better today. I am going to give him another week of doxycycline before reattempting the total contact cast 12/31; the patient is completing antibiotics. Hemorrhagic debris in the distal part of the wound with some undermining distally. He also had hyper granulation. Extensive debridement with a #5 curette. The infected area that was on the lateral part of the fifth met head is closed over. I do not think he needs any more antibiotics. Patient was seen prior to HBO. Preparations for a total contact cast were made in the cast will be placed post hyperbarics 04/11/19; once again the patient arrives today without complaint. He had been in a cast all week noted that he had heavy drainage this week. This resulted in large raised areas of macerated tissue around the wound 1/14; wound bed looks better slightly smaller. Hydrofera Blue has  been changing himself. He had a heavy drainage last week which caused a lot of maceration around the wound so I took him out of a total contact cast he says the drainage is actually better this week He is seen today in conjunction with HBO 1/21; returns to clinic. He was up in [place] for a day or 2 attending a funeral. He comes back in with the wound larger and with a large area of exposed bone. He had osteomyelitis and septic arthritis of the fifth left metatarsal head while he was in hospital. He received IV antibiotics in the hospital for a prolonged period of time then 3 weeks of Augmentin. Subsequently I gave him 2 weeks of doxycycline for more superficial wound infection. When I saw this last week the wound was smaller the surface of the wound looks satisfactory. 1/28; patient missed hyperbarics today. Bone biopsy  I did last time showed Enterococcus faecalis and Staphylococcus lugdunensis . He has a wide area of exposed bone. We are going to use silver alginate as of today. I had another ethical discussion with the patient. 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 In this situation I think the likelihood of healing this is low. Therefore I have recommended a ray amputation and with the patient's agreement I have referred him to Dr. Doran Durand. The other issue is that his compliance with hyperbarics has been minimal because of his work schedule and given his underlying decision I am going to stop this today READMISSION 10/24/2019 MRI 09/29/2019 left foot IMPRESSION: 1. Apparent skin ulceration inferior and lateral to the 5th metatarsal base with underlying heterogeneous T2 signal and enhancement in the subcutaneous fat. Small peripherally enhancing fluid collections along the plantar and lateral aspects of the 5th metatarsal base suspicious for abscesses. 2. Interval amputation through the mid 5th metatarsal with nonspecific low-level marrow edema and  enhancement. Given the proximity to the adjacent soft tissue inflammatory changes, osteomyelitis cannot be excluded. 3. The additional bones appear unremarkable. MRI 09/29/2019 right foot IMPRESSION: 1. Soft tissue ulceration lateral to the 5th MTP joint. There is low-level T2 hyperintensity within the 4th and 5th metatarsal heads and adjacent proximal phalanges without abnormal T1 signal or cortical destruction. These findings are nonspecific and could be seen with early marrow edema, hyperemia or early osteomyelitis. No evidence of septic joint. 2. Mild tenosynovitis and synovial enhancement associated with the extensor digitorum tendons at the level of the midfoot. 3. Diffuse low-level muscular T2 hyperintensity and enhancement, most consistent with diabetic myopathy. LEFT FOOT BONE Methicillin resistant staphylococcus aureus Staphylococcus lugdunensis MIC MIC CIPROFLOXACIN >=8 RESISTANT Resistant <=0.5 SENSI... Sensitive Hyden, [name] ([id number]) [id number] Page 3 of 20 CLINDAMYCIN <=0.25 SENS... Sensitive >=8 RESISTANT Resistant ERYTHROMYCIN >=8 RESISTANT Resistant >=8 RESISTANT Resistant GENTAMICIN <=0.5 SENSI... Sensitive <=0.5 SENSI... Sensitive Inducible Clindamycin NEGATIVE Sensitive NEGATIVE Sensitive OXACILLIN >=4 RESISTANT Resistant 2 SENSITIVE Sensitive RIFAMPIN <=0.5 SENSI... Sensitive <=0.5 SENSI... Sensitive TETRACYCLINE <=1 SENSITIVE Sensitive <=1 SENSITIVE Sensitive TRIMETH/SULFA <=10 SENSIT Sensitive <=10 SENSIT Sensitive ... Marland Kitchen.. VANCOMYCIN 1 SENSITIVE Sensitive <=0.5 SENSI... Sensitive Right foot bone . Component 3 wk ago Specimen Description BONE Special Requests RIGHT 4 METATARSAL SAMPLE B Gram Stain NO WBC SEEN NO ORGANISMS SEEN Culture RARE METHICILLIN RESISTANT STAPHYLOCOCCUS AUREUS NO ANAEROBES ISOLATED Performed at [facility], [place] [address], [place], [place] [address] Report Status 10/08/2019 FINAL Organism  ID, Bacteria METHICILLIN RESISTANT STAPHYLOCOCCUS AUREUS Resulting Agency [facility] Susceptibility Methicillin resistant staphylococcus aureus MIC CIPROFLOXACIN >=8 RESISTANT Resistant CLINDAMYCIN <=0.25 SENS... Sensitive ERYTHROMYCIN >=8 RESISTANT Resistant GENTAMICIN <=0.5 SENSI... Sensitive Inducible Clindamycin NEGATIVE Sensitive OXACILLIN >=4 RESISTANT Resistant RIFAMPIN <=0.5 SENSI... Sensitive TETRACYCLINE <=1 SENSITIVE Sensitive TRIMETH/SULFA <=10 SENSIT Sensitive ... VANCOMYCIN 1 SENSITIVE Sensitive This is a patient we had in clinic earlier this year with a wound over his left fifth metatarsal head. 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 Doran Durand on 05/16/2019. He seems to have developed problems with wounds on his bilateral feet in June 2021 although he may have had problems earlier than this. He was in an urgent care with a right foot ulcer on 09/26/2019 and given a course  of doxycycline. This was apparently after having trouble getting into see orthopedics. He was seen by podiatry on 09/28/2019 noted to have bilateral lower extremity ulcers including the left lateral fifth metatarsal base and the right subfifth met head. It was noted that had purulent drainage at that time. He required hospitalization from 6/20 through 7/2. This was because of worsening right foot wounds. He underwent bilateral operative incision and drainage and bone biopsies bilaterally. Culture results are listed above. He has been referred back to clinic by Dr. Jacqualyn Posey of podiatry. He is also followed by Dr. Megan Salon who saw him yesterday. He was discharged from hospital on Zyvox Flagyl and Levaquin and yesterday changed  to doxycycline Flagyl and Levaquin. His inflammatory markers on 6/26 showed a sedimentation rate of 129 and a C-reactive protein of 5. This is improved to 14 and 1.3 respectively. This would indicate improvement. ABIs in our clinic today were 1.23 on the right and 1.20 on the left 11/01/2019 on evaluation today patient appears to be doing fairly well in regard to the wounds on his feet at this point. Fortunately there is no signs of active infection at this time. No fevers, chills, nausea, vomiting, or diarrhea. He currently is seeing infectious disease and still under their care at this point. Subsequently he also has both wounds which she has not been using collagen on as he did not receive that in his packaging he did not call us and let us know that. Apparently that just was missed on the order. Nonetheless we will get that straightened out today. 8/9-Patient returns for bilateral foot wounds, using Prisma with hydrogel moistened dressings, and the wounds appear stable. Patient using surgical shoes, avoiding much pressure or weightbearing as much as possible 8/16; patient has bilateral foot wounds. 1 on the right lateral foot proximally the other is on the left mid lateral foot. Both required debridement of callus and thick skin around the wounds. We have been using silver collagen 8/27; patient has bilateral lateral foot wounds. The area on the left substantially surrounded by callus and dry skin. This was removed from the wound edge. The underlying wound is small. The area on the right measured somewhat smaller today. 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 Unfortunately I did not update his antibiotics during today's visit. 9/10 I reviewed Dr. Hale Bogus last notes he felt he had completed antibiotics his inflammatory markers were reasonably well controlled. He has a small wound on the lateral left foot and a tiny area on  the right which is just above closed. He is using Hydrofera Blue with border foam he has bilateral surgical shoes 9/24; 2 week f/u. doing well. right foot is closed. left foot still undermined. 10/14; right foot remains closed at the fifth met head. The area over the base of the left fifth metatarsal has a small open area but considerable undermining towards the plantar foot. Thick callus skin around this suggests an adequate pressure relief. We have talked about this. He says he is going to go back into his cam boot. I suggested a total contact cast he did not seem enamored with this suggestion 10/26; left foot base of the fifth metatarsal. Same condition as last time. He has skin over the area with an open wound however the skin is not adherent. He went to see Dr. Earleen Newport who did an x-ray and culture of his foot I have not reviewed the x-ray but the  patient was not told anything. He is on doxycycline 11/11; since the patient was last here he was in the emergency room on 10/30 he was concerned about swelling in the left foot. They did not do any cultures or x-rays. They changed his antibiotics to cephalexin. Previous culture showed group B strep. The cephalexin is appropriate as doxycycline has less than predictable coverage. Arrives in clinic today with swelling over this area under the wound. He also has a new wound on the right fifth metatarsal head 11/18; the patient has a difficult wound on the lateral aspect of the left fifth metatarsal head. The wound was almost ballotable last week I opened it slightly expecting to see purulence however there was just bleeding. I cultured this this was negative. X-ray unchanged. We are trying to get an MRI but I am not sure were going to be able to get this through his insurance. He also has an area on the right lateral fifth metatarsal head this looks healthier 12/3; the patient finally got our MRI. Surprisingly this did not show osteomyelitis. I did show the  soft tissue ulceration at the lateral plantar aspect of the fifth metatarsal base with a tiny residual 6 mm abscess overlying the superficial fascia I have tried to culture this area I have not been able to get this to grow anything. Nevertheless the protruding tissue looks aggravated. I suspect we should try to treat the underlying "abscess with broad-spectrum antibiotics. I am going to start him on Levaquin and Flagyl. He has much less edema in his legs and I am going to continue to wrap his legs and see him weekly 12/10. I started Levaquin and Flagyl on him last week. He just picked up the Flagyl apparently there was some delay. The worry is the wound on the left fifth metatarsal base which is substantial and worsening. His foot looks like he inverts at the ankle making this a weightbearing surface. Certainly no improvement in fact I think the measurements of this are somewhat worse. We have been using 12/17; he apparently just got the Levaquin yesterday this is 2 weeks after the fact. He has completed the Flagyl. The area over the left fifth metatarsal base still has protruding granulation tissue although it does not look quite as bad as it did some weeks ago. He has severe bilateral lymphedema although we have not been treating him for wounds on his legs this is definitely going to require compression. There was so much edema in the left I did not wish to put him in a Coffey, [name] ([id number]) [id number] Page 4 of 20 total contact cast today. I am going to increase his compression from 3-4 layer. The area on the right lateral fifth met head actually look quite good and superficial. 12/23; patient arrived with callus on the right fifth met head and the substantial hyper granulated callused wound on the base of his fifth metatarsal. He says he is completing his Levaquin in 2 days but I do not think that adds up with what I gave him but I will have to double check  this. We are using Hydrofera Blue on both areas. My plan is to put the left leg in a cast the week after New Year's 04/06/2020; patient's wounds about the same. Right lateral fifth metatarsal head and left lateral foot over the base of the fifth metatarsal. There is undermining on the left lateral foot which I removed before application of total contact cast continuing with Hydrofera Blue  new. Patient tells me he was seen by endocrinology today lab work was done [Dr. Kerr]. Also wondering whether he was referred to cardiology. I went over some lab work from previously does not have chronic renal failure certainly not nephrotic range proteinuria he does have very poorly controlled diabetes but this is not his most updated lab work. Hemoglobin A1c has been over 11 1/10; the patient had a considerable amount of leakage towards mid part of his left foot with macerated skin however the wound surface looks better the area on the right lateral fifth met head is better as well. I am going to change the dressing on the left foot under the total contact cast to silver alginate, continue with Hydrofera Blue on the right. 1/20; patient was in the total contact cast for 10 days. Considerable amount of drainage although the skin around the wound does not look too bad on the left foot. The area on the right fifth metatarsal head is closed. Our nursing staff reports large amount of drainage out of the left lateral foot wound 1/25; continues with copious amounts of drainage described by our intake staff. PCR culture I did last week showed E. coli and Enterococcus faecalis and low quantities. Multiple resistance genes documented including extended spectrum beta lactamase, MRSA, MRSE, quinolone, tetracycline. The wound is not quite as good this week as it was 5 days ago but about the same size 2/3; continues with copious amounts of malodorous drainage per our intake nurse. The PCR culture I did 2 weeks ago showed E. coli  and low quantities of Enterococcus. There were multiple resistance genes detected. I put Neosporin on him last week although this does not seem to have helped. The wound is slightly deeper today. Offloading continues to be an issue here although with the amount of drainage she has a total contact cast is just not going to work 2/10; moderate amount of drainage. Patient reports he cannot get his stocking on over the dressing. I told him we have to do that the nurse gave him suggestions on how to make this work. The wound is on the bottom and lateral part of his left foot. Is cultured predominantly grew low amounts of Enterococcus, E. coli and anaerobes. There were multiple resistance genes detected including extended spectrum beta lactamase, quinolone, tetracycline. I could not think of an easy oral combination to address this so for now I am going to do topical antibiotics provided by [facility] I think the main agents here are vancomycin and an aminoglycoside. We have to be able to give him access to the wounds to get the topical antibiotic on 2/17; moderate amount of drainage this is unchanged. He has his Keystone topical antibiotic against the deep tissue culture organisms. He has been using this and changing the dressing daily. Silver alginate on the wound surface. 2/24; using Keystone antibiotic with silver alginate on the top. He had too much drainage for a total contact cast at one point although I think that is improving and I think in the next week or 2 it might be possible to replace a total contact cast I did not do this today. In general the wound surface looks healthy however he continues to have thick rims of skin and subcutaneous tissue around the wide area of the circumference which I debrided 06/04/2020 upon evaluation today patient appears to be doing well in regard to his wound. I do feel like he is showing signs of improvement. There is little bit  of callus and dead tissue around the  edges of the wound as well as what appears to be a little bit of a sinus tract that is off to the side laterally I would perform debridement to clear that away today. 3/17; left lateral foot. The wound looks about the same as I remember. Not much depth surface looks healthy. No evidence of infection 3/25; left lateral foot. Wound surface looks about the same. Separating epithelium from the circumference. There really is no evidence of infection here however not making progress by my view 3/29; left lateral foot. Surface of the wound again looks reasonably healthy still thick skin and subcutaneous tissue around the wound margins. There is no evidence of infection. One of the concerns being brought up by the nurses has again the amount of drainage vis--vis continued use of a total contact cast 4/5; left lateral foot at roughly the base of the fifth metatarsal. Nice healthy looking granulated tissue with rims of epithelialization. The overall wound measurements are not any better but the tissue looks healthy. The only concern is the amount of drainage although he has no surrounding maceration with what we have been doing recently to absorb fluid and protect his skin. He also has lymphedema. 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 He states that the kerlix/coban under the TCC rolled down and could not get it back up. He has been using an offloading scooter and has somehow hurt his right foot using this device. This happened last week. He states that the side of his right foot developed a blister and opened. The top of his foot also has a few small open wounds he thinks is due to his socks rubbing in his shoes. He has not been using any dressings to the  wound. He denies purulent drainage, fever/chills or erythema to the wounds. 4/22; patient presents for 1 week follow-up. He developed new wounds to the right foot that were evaluated at last clinic visit. He continues to have a total contact cast to the left leg and he reports no issues. He has been using silver collagen to the right foot wounds with no issues. He denies purulent drainage, fever/chills or erythema to the right foot wounds. He has no complaints today 4/25; patient presents for 1 week follow-up. He has a total contact cast of the left leg and reports no issues. He has been using silver alginate to the right foot wound. He denies purulent drainage, fever/chills or erythema to the right foot wounds. 5/2 patient presents for 1 week follow-up. T contact cast on the left. The wound which is on the base of the plantar foot at the base of the fifth metatarsal otal actually looks quite good and dimensions continue to gradually contract. HOWEVER the area on the right lateral fifth metatarsal head is much larger than what I remember from 2 weeks ago. Once more is he has significant levels of hypergranulation. Noteworthy that he had this same hyper granulated response on his wound on the left foot at one point in time. So much so that he I thought there was an underlying fluid collection. Based on this I think this just needs debridement. 5/9; the wound on the left actually continues to  be gradually smaller with a healthy surface. Slight amount of drainage and maceration of the skin around but not too bad. However he has a large wound over the right fifth metatarsal head very much in the same configuration as his left foot wound was initially. I used silver nitrate to address the hyper granulated tissue no mechanical debridement 5/16; area on the left foot did not look as healthy this week deeper thick surrounding macerated skin and subcutaneous tissue. The area on the right foot fifth met head  was about the same The area on the right ankle that we identified last week is completely broken down into an open wound presumably a stocking rubbing issue 5/23; patient has been using a total contact cast to the left side. He has been using silver alginate underneath. He has also been using silver alginate to the right foot wounds. He has no complaints today. He denies any signs of infection. 5/31; the left-sided wound looks some better measure smaller surface granulation looks better. We have been using silver alginate under the total contact cast The large area on his right fifth met head and right dorsal foot look about the same still using silver alginate 6/6; neither side is good as I was hoping although the surface area dimensions are better. A lot of maceration on his left and right foot around the wound edge. Area on the dorsal right foot looks better. He says he was traveling. I am not sure what does the amount of maceration around the plantar wounds may be drainage issues 6/13; in general the wound surfaces look quite good on both sides. Macerated skin and raised edges around the wound required debridement although in general especially on the left the surface area seems improved. The area on the right dorsal ankle is about the same I thought this would not be such a problem to close 6/20; not much change in either wound although the one on the right looks a little better. Both wounds have thick macerated edges to the skin requiring debridements. We have been using silver alginate. The area on the dorsal right ankle is still open I thought this would be closed. 6/28; patient comes in today with a marked deterioration in the right foot wound fifth met head. Wide area of exposed bone this is a drastic change from last time. The area on the left there we have been casting is stagnant. We have been using silver alginate in both wound areas. 7/5; bone culture I did for PCR last time was  positive for Pseudomonas, group B strep, Enterococcus and Staph aureus. There was no suggestion of methicillin Courser, [name] ([id number]) [id number] Page 5 of 20 resistance or ampicillin resistant genes. This was resistant to tetracycline however He comes into the clinic today with the area over his right plantar fifth metatarsal head which had been doing so well 2 weeks ago completely necrotic feeling bone. I do not know that this is going to be salvageable. The left foot wound is certainly no smaller but it has a better surface and is superficial. 7/8; patient called in this morning to say that his total contact cast was rubbing against his foot. He states he is doing fine overall. He denies signs of infection. 7/12; continued deterioration in the wound over the right fifth metatarsal head crumbling bone. This is not going to be salvageable. The patient agrees and wants to be referred to Dr. Doran Durand which we will attempt to arrange as soon as possible.  I am going to continue him on antibiotics as long as that takes so I will renew those today. The area on the left foot which is the base of the fifth metatarsal continues to look somewhat better. Healthy looking tissue no depth no debridement is necessary here. 7/20; the patient was kindly seen by Dr. Doran Durand of orthopedics on 10/19/2020. 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 Towards this end we have taken him out of the total contact cast on the left we will put him in a wrap with Hydrofera Blue. As I understand things surgery is planned for 7/21 7/27; patient had his surgery last Thursday. 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 Apparently everything went well we did not still disturb that today The area on the left foot actually looks quite good. He has been much less mobile which probably explains this he did not seem to  do well in the total contact cast secondary to drainage and maceration I think. 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 Fortunately there is no evidence of active infection at this time. No fevers, chills, nausea, vomiting, or diarrhea. Overall I think that he is actually doing extremely well. Nonetheless I do believe that he is staying off of this more following the surgery in his right foot that is the reason the left is doing so great. 8/16; left plantar foot wound. This looks smaller than the last time I saw this he is using Hydrofera Blue. The surgical wound on the right foot is being followed by Dr. Doran Durand we did not look at this today. He has surgical shoes on both feet 8/23; left plantar foot wound not as good this week. Surrounding macerated skin and subcutaneous tissue everything looks moist and wet. I do not think he is offloading this adequately. He is using a surgical shoe Apparently the right foot surgical wound is not open although I did not check his foot 8/31; left plantar foot lateral aspect. Much improved this week. He has no maceration. Some improvement in the surface area of the wound but most impressively the depth is come in we are using silver alginate. The patient is a Product/process development scientist. He is asked that we write him a letter so he can go back to work. I have also tried to see if we can write something that will allow him to limit the amount of time that he is on his foot at work. 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 Hopefully the latter can be avoided 9/6; I believe the patient missed an appointment last week. He arrives in today with a wound looking roughly the same certainly no better. Undermining laterally and also inferiorly. We used molecuLight today in training with  the patient's permission.. We are using silver alginate 9/21 wound is measuring bigger this week although this may have to do with the aggressive circumferential debridement last week in response to the blush fluorescence on the MolecuLight. Culture I did last week showed significant MSSA and E. coli. I put him on Augmentin but he has not started it yet. We are also going to send this for compounded antibiotics at [facility]. There is  no evidence of systemic infection 9/29; silver alginate. His Keystone arrived. He is completing Augmentin in 2 days. Offloading in a cam boot. Moderate drainage per our intake staff 10/5; using silver alginate. He has been using his [place]. He has completed his Augmentin. Per our intake nurse still a lot of drainage, far too much to consider a total contact cast. Wound measures about the same. He had the same undermining area that I defined last week from a roughly 11-3. I remove this today 10/12; using silver alginate he is using the [place]. He comes in for a nurse visit hence we are applying [employer] twice a week. Measuring slightly better today and less notable drainage. Extensive debridement of the wound edge last time 10/18; using topical Keystone and silver alginate and a soft cast. Wound measurements about the same. Drainage was through his soft cast. We are changing this twice a week Tuesdays and Friday 10/25; comes in with moderate drainage. Still using Keystone silver alginate and a soft cast. Wound dimensions completely the same.He has a lot of edema in the left leg he has lymphedema. Asking for [name] to consider wrapping him as he cannot get his stocking on over the soft cast 11/2; comes in with moderate to large drainage slightly smaller in terms of width we have been using [place]. His wound looks satisfactory but not much improvement 11/4; patient presents today for obligatory cast change. Has no issues or complaints today. He denies signs of  infection. 11/9; patient traveled this weekend to [place], was on the cast quite a bit. Staining of the cast with black material from his walking boot. Drainage was not quite as bad as we feared. Using silver alginate and Keystone 11/16; we do not have size for cast therefore we have been putting a soft cast on him since the change on Friday. Still a significant amount of drainage necessitating changing twice a week. We have been using the Keystone at cast changes either hard or soft as well as silver alginate Comes in the clinic with things actually looking fairly good improvement in width. He says his offloading is about the same 02/24/2021 upon evaluation today patient actually comes back in and is doing excellent in regard to his foot ulcer this is significantly smaller even compared to the last visit. The soft cast seems to have done extremely well for him which is great news. I do not see any signs of infection minimal debridement will be needed today. 11/30; left lateral foot much improved half a centimeter improvement in surface area. No evidence of infection. He seems to be doing better with the soft cast in the TCC therefore we will continue with this. He comes back in later in the week for a change with the nurses. This is due to drainage 12/6; no improvement in dimensions. Under illumination some debris on the surface we have been using silver alginate, soft cast. If there is anything optimistic here he seems to have have less drainage 12/13. Dimensions are improved both length and width and slightly in depth. Appears to be quite healthy today. Raised edges of this thick skin and callus around the edges however. He is in a soft cast were bringing him back once for a change on Friday. Drainage is better 12/20. Dimensions are improved. He still has raised edges of thick skin and callus around the edges. We are using a soft cast 12/28; comes in today with thick callus around the wound. Using  silver under alginate  under a soft cast. I do not think there is much improvement in any measurement 2023 04/06/2021; patient was put in a total contact cast. Unfortunately not much change in surface area 1/10; not much different still thick callus and skin around the edge in spite of the total contact cast. This was just debrided last week we have been using the [facility] compounded antibiotic and silver alginate under a total contact cast 1/18 the patient's wound on the left side is doing nicely. smaller HOWEVER he comes in today with a wound on the right foot laterally. blister most likely serosangquenous drainage 1/24; the patient continues to do well in terms of the plantar left foot which is continued to contract using silver alginate under the total contact cast Dozal, [name] ([id number]) [id number] Page 6 of 20 HOWEVER the right lateral foot is bigger with denuded skin around the edges. I used pickups and a #15 scalpel to remove this this looks like the remanence of a large blister. Cannot rule out infection. Culture in this area I did last week showed Staphylococcus lugdunensis few colonies. I am going to try to address this with his [employer] antibiotic that is done so well on the left having linezolid and this should cover the staph 2/1; the patient's wound on his left foot which was the original plantar foot wound thick skin and eschar around the edges even in the total contact cast but the wound surface does not look too bad The real problem is on how his right lateral foot at roughly the base of the fifth metatarsal. The wound is completely necrotic more worrisome than that there is swelling around the edges of this. We have been using silver alginate on both wounds and Keystone on the right foot. Unfortunately I think he is going to require systemic antibiotics while we await cultures. He did not get the x-ray done that we ordered last week [lost the  prescription 2/7; disappointingly in the area on the left foot which we are treating with a total contact cast is still not closed although it is much smaller. He continues to have a lot of callus around the wound edge. -Right lateral foot culture I did last week was negative x-ray also negative for osteomyelitis. 2/15: TCC silver alginate on the left and silver alginate on the right lateral. No real improvement in either area 05/26/2021: T oday, the wounds are roughly the same size as at his previous visit, post-debridement. He continues to endorse fairly substantial drainage, particularly on the right. He has been in a total contact cast on the left. There is still some callus surrounding this lesion. On the right, the periwound skin is quite macerated, along with surrounding callus. The center of the right-sided wound also has some dark, densely adherent material, which is very difficult to remove. 06/02/2021: Today, both wounds are slightly smaller. He has been using zinc oxide ointment around the right ulcer and the degree of maceration has improved markedly. There continues to be an area of nonviable tissue in the center of the right sided ulcer. The left-sided wound, which has been in the total contact cast. Appears clean and the degree of callus around it is less than previously. 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 He had to take the stairs and both wounds have increased in size. The left foot, which has been in a total contact cast, has developed a tunnel tracking to the lateral  aspect of his foot. The nonviable tissue in the center of the right-sided ulcer remains recalcitrant to debridement. There is significant undermining surrounding the entirety of the left sided wound. 06/16/2021: The elevator at school has been fixed and the patient has been able to avoid putting as much weight on his wounds over the past week.  We converted the left foot wound into a single lesion today, but despite this, the wound is actually smaller. The base is healthy with limited periwound callus. On the right, the central necrotic area is still present. He continues to be quite macerated around the right sided wound, despite applying barrier cream. This does, however, have the benefit of softening the callus to make it more easily removable. 06/23/2021: Today, the left wound is smaller. The lateral aspect that had opened up previously is now closed. The wound base has a healthy bed of granulation tissue and minimal slough. Unfortunately, on the right, the wound is larger and continues to be fairly macerated. He has also reopened the wound at his right ankle. He thinks this is due to the gait he has adopted secondary to his total contact cast and boot. 06/30/2021: T oday, both wounds are a little bit larger. The lateral aspect on the left has remained closed. He continues to have significant periwound maceration. The culture that I took from the right sided wound grew a population of bacteria that is not covered by his current [facility] antibiotic. The center of the right- sided wound continues to appear necrotic with nonviable fat. It probes deeper today, but does not reach bone. 07/07/2021: The periwound maceration is a little bit less today. The right lateral foot wound has some areas that appear more viable and the necrotic center also looks a little bit better. The wound on the dorsal surface of his right foot near the ankle is contracting and the surface appears healthy. The left plantar wound surface looks healthy, but there is some new undermining on the medial portion. He did get his new Keystone antibiotic and began applying that to the right foot wound on Saturday. 07/14/2021: The intake nurse reported substantial drainage from his wounds, but the periwound skin actually looks better than is typical for him. The wound on the dorsal  surface of his right foot near the ankle is smaller and just has a small open area underneath some dried eschar. The left plantar wound surface looks healthy and there has been no significant accumulation of callus. The right lateral foot wound looks quite a bit better, with the central portion, which typically appears necrotic, looking more viable albeit pale. 07/22/2021: His left foot is extremely macerated today. The wound is about the same size. The wound on the dorsal surface of his right foot near the ankle had closed, but he traumatized it removing the dressing and there is a tiny skin tear in that location. The right lateral foot wound is bigger, but the surface appears healthy. 07/30/2021: The wound on the dorsal surface of his right foot near the ankle is closed. The right lateral foot wound again is a little bit bigger due to some undermining. The periwound skin is in better condition, however. He has been applying zinc oxide. The wound surface is a little bit dry today. On the left, he does not have the substantial maceration that we frequently see. The wound itself is smaller and has a clean surface. 08/06/2021: Both wounds seem to have deteriorated over the past week. The right lateral foot  wound has a dry surface but the periwound is boggy.. Overall wound dimensions are about the same. On the left, the wound is about the same size, but there is more undermining present underneath periwound callus. 08/13/2021: The right sided wound looks about the same, but on the left there has been substantial deterioration. The undermining continues to extend under periwound callus. Once this was removed, substantial extension of the wound was present. There is no odor or purulent drainage but clearly the wounds have broken down. 08/20/2021: The wounds look about the same today. He has been out of his total contact cast and has just been changing the dressings using topical Keystone with PolyMem Ag, Kerlix  and Ace bandages. The wound on the top of his right ankle has reopened but this is quite small. There was a little bit of purulent material that I expressed when examining this wound. 08/24/2021: After the aggressive debridement I performed at his last visit, the wounds actually look a little bit better today. They are smaller with the exception of the wound on the top of his right ankle which is a little bit bigger as some more skin pulled off when he was changing his dressing. We are using topical Keystone with PolyMem Ag Kerlix and Ace bandages. 09/02/2021: There has been really no change to any of his wounds. 09/16/2021: The patient was hospitalized last week with nausea, vomiting, and dehydration. He says that while he was in the hospital, his wounds were not really addressed properly. T oday, both plantar foot wounds are larger and the periwound skin is macerated. The wound on the dorsum of his right foot has a scab on the top. The right foot now has a crater where previously he had had nonviable fat. It looks as though this simply died and fell out. The periwound callus is wet. 09/24/2021: His wounds have deteriorated somewhat since his last visit. The wound on the dorsum of his right foot near his ankle is larger and has more nonviable tissue present. The crater in his right foot is even deeper; I cannot quite palpate or probe to bone but I am sure it is close. The wound on his left plantar foot has an odd boggy area in the center that almost feels as though it has fluid within it. He has run out of his topical Keystone antibiotic. We are using silver alginate on his wounds. 09/29/2021: He has developed a new wound on the dorsum of his left foot near his ankle. He says he thinks his wrapping is rubbing in that site. I would concur with this as the wound on his right ankle is larger. The left foot looks about the same. The right foot has the crater that was present last week. No significant slough  accumulation, but his foot remains quite swollen and warm despite oral antibiotic therapy. 10/08/2021: All of his wounds look about the same as last week. He did not start his oral antibiotics that are prescribed until just a couple of days ago; his [employer] compounded antibiotics formula has been changed and he is awaiting delivery of the new recipe. His MRI that was scheduled for earlier this week was canceled as no prior authorization had been obtained; unfortunately the tech responsible sent an email to my old [facility] email, which I no longer use [place], [name] ([id number]) [id number] Page 7 of 20 nor have access to. 10/18/2021: The wounds on his bilateral dorsal feet near the ankles are both improved. They are  smaller and have just some eschar and slough buildup. The left plantar wound has a fair amount of undermining, but the surface is clean. There is some periwound callus accumulation. On the right plantar foot, there is nonviable fat leading to a deep tunnel that tracks towards his dorsal medial foot. There is periwound callus and slough accumulation, as well. His right foot and leg remain swollen as compared to the left. 10/25/2021: The wounds on his bilateral dorsal feet and at the ankles have broken down somewhat. They are little bit larger than last week. The left plantar wound continues to undermine laterally but the surface is clean. The right plantar foot wound shows some decreased depth in the tunnel tracking towards his dorsal medial foot. He has not yet had the Doppler study that I ordered; it sounds like there is some confusion about the scheduling of the procedure. In addition, the MRI was denied and I have taken steps to appeal the denial. 11/24/2021: Since our last visit, Mr. Antrim was admitted to the hospital where an MRI suggested osteomyelitis. He was taken the operating room by podiatry. Bone biopsies were negative for osteomyelitis. They  debrided his wounds and applied myriad matrix. He saw them last week and they removed his staples. He is here today to continue his wound healing process. T oday, both of the dorsal foot/ankle wounds are substantially smaller. There is just a little eschar overlying the left sided wound and some eschar and slough on the right. The right plantar foot ulcer has the healthiest surface of granulation tissue that I have seen to date. A portion of the myriad matrix failed to take and was hanging loose. It appears that myriad morcells were placed into the tunnel closest to the dorsal portion of his foot. These have sloughed off. The left plantar foot ulcer is about the same size, but has a much healthier surface than in the past. Both plantar ulcers have callus and slough accumulation. 12/07/2021: Left dorsal foot/ankle wound is closed. The right dorsal foot/ankle wound is nearly closed and just has a small open area with some eschar and slough. The right plantar foot wound has contracted quite a bit since our last visit. It has a healthy surface with just a little bit of slough accumulation and periwound callus buildup. The left plantar foot wound is about the same size but the surface appears healthy. There is a little slough and periwound callus on this side, as well. 12/15/2021: Both dorsal foot/ankle wounds are closed. The right plantar foot wound is substantially smaller than at our last visit. The tunneling that was present has nearly closed. There is just a little bit of slough buildup. The left plantar foot wound is also a little bit smaller today. The surface is the healthiest that I have ever seen it. Light slough and periwound callus accumulation on this side. 12/22/2021: The dorsal ankle/foot wounds remain closed. The right plantar foot wound continues to contract. There is still a bit of depth at the lateral portion of the wound but the surface has a good granulated appearance. The wound on the left  is about the same size, the central indented portion is still adherent. It also is clean with good granulation tissue present. The periwound skin and callus are a bit macerated, however. 12/29/2021: Both ankle wounds remain closed. The right plantar foot wound is less than half the size that it was last week. There is no depth any longer and the surface has just a  little bit of slough and periwound callus. On the left, the wound is not much smaller, but the surface is healthier with good granulation tissue. There is also a little slough and periwound callus buildup. 01/05/2022: The right plantar foot wound continues to contract and is once again about half the size as it was last week. There is just a little bit of surface slough and periwound callus. On the left, the depth of the wound has decreased and the diameter has started to contract. It is also very clean with just a little slough and biofilm present. 01/12/2022: The right plantar foot wound is smaller again today. There is just some periwound callus and slough accumulation. On the left, there is a fair amount of undermining and the surface is a little boggy, but no other significant change. 01/19/2022: The right plantar wound continues to contract. There is minimal periwound callus accumulation and just a light layer of slough on the surface. On the left, the surface looks better, but there is fairly significant undermining near the 11:00 portion of the wound, aiming towards his great toe. The skin overlying this area is very healthy and has a good fat layer present. No malodor or purulent drainage. 01/26/2022: The right sided wound continues to contract and has just a light layer of slough on the surface. Minimal periwound callus. On the left, he still has substantial undermining and the tissue surface is not as robust as I would like to see. No malodor or purulent drainage, but he did say he had a lot of serous drainage over the  weekend. 02/02/2022: The right foot wound is about a quarter of the size that it was at his last visit. There is just a little slough on the surface and some periwound callus. On the left, the undermining persists and the tissue is still more pale, but he does not seem to have had as much drainage over the last weekend. We are waiting on a wound VAC. 02/09/2022: His right foot has healed. He received the wound VAC, but did not bring it to clinic with him today. The lateral aspect of the left foot wound has come in a little, but he still has substantial undermining on the medial and distal portion of the wound. Still with macerated periwound callus. 02/16/2022: The wound VAC was applied last Friday. T oday, there has been tremendous improvement in the surface of the wound bed. The undermined portion of the wound has come in a bit. There is still some overhanging dead skin. Overall the wound looks much better. 02/23/2022: No significant change in the overall wound dimensions, but the wound surface continues to improve and appears healthier and more robust. There is some periwound callus accumulation and overhanging skin. No concern for infection. 03/02/2022: Although the measurements taken in clinic today were unchanged, the visual appearance of the wound looks like it is smaller and the undermining/tunneling is filling with flesh. There is less periwound tissue maceration than usual. No malodor or purulent drainage. No concern for infection. 03/09/2022: The undermining has come in by couple of millimeters. The periwound is a little bit more macerated this week. No concern for infection. 12/13; substantial wound on the left plantar foot. We are using silver collagen and a wound VAC. 03/23/2022: The undermining continues to contract. The wound surface is a little bit dry. 03/30/2022: The undermining has essentially resolved. There is still some depth at the center of the wound. The wound surface has good  beefy tissue  and the moisture balance is better. 04/06/2022: The wound dimensions are about the same, except that the depth is beginning to fill in. He has had more drainage this week, but there is no obvious concern for infection. Electronic Signature(s) Signed: 04/06/2022 9:37:29 AM By: [name] MD FACS Entered By: [name] on 04/06/2022 09:37:29 [place], [name] ([id number]) [id number] Page 8 of 20 -------------------------------------------------------------------------------- Physical Exam Details Patient Name: Date of Service: [name] D 04/06/2022 8:45 A M Medical Record Number: [id number] Patient Account Number: [id number] Date of Birth/Sex: Treating RN: Sep 17, 1985 (37 y.o. M) Primary Care Provider: [name] Other Clinician: Referring Provider: Treating Provider/Extender: [name] in Treatment: 39 Constitutional He is hypertensive, but asymptomatic.. . . . No acute distress. Respiratory Normal work of breathing on room air. Notes 04/06/2022: The wound dimensions are about the same, except that the depth is beginning to fill in. He has had more drainage this week, but there is no obvious concern for infection. Electronic Signature(s) Signed: 04/06/2022 9:38:03 AM By: [name] MD FACS Entered By: [name] on 04/06/2022 09:38:03 -------------------------------------------------------------------------------- Physician Orders Details Patient Name: Date of Service: [name], [name] 04/06/2022 8:45 A M Medical Record Number: [id number] Patient Account Number: [id number] Date of Birth/Sex: Treating RN: 1986-02-03 (37 y.o. 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 - Dr. [name] - Room 1 with [name] Return A Bathing/ Shower/ Hygiene May shower and wash wound with soap and water. Negative Presssure Wound Therapy Wound #3 Left,Lateral,Plantar Foot Wound Vac to wound continuously at 11m/hg pressure Black Foam Edema Control - Lymphedema / SCD / Other Bilateral Lower Extremities Avoid standing for long periods of time. Exercise regularly Compression stocking or Garment 20-30 mm/Hg pressure to: - to both legs daily Off-Loading Open toe surgical shoe to: - Both feet Routon, CMali([id number] Page 9 of 20 Additional Orders / Instructions Follow Nutritious Diet Non Wound Condition pply the following to affected area as directed: - continue to apply foam donut or cushion to healed right plantar foot A Wound Treatment Wound #3 - Foot Wound Laterality: Plantar, Left, Lateral Cleanser: Soap and Water 3 x Per Week/30 Days Discharge Instructions: May shower and wash wound with dial antibacterial soap and water prior to dressing change. Peri-Wound Care: Skin Prep (Generic) 3 x Per Week/30 Days Discharge Instructions: Use skin prep as directed Prim Dressing: Promogran Prisma Matrix, 4.34 (sq in) (silver collagen) (Generic) 3 x Per Week/30 Days ary Discharge Instructions: Moisten collagen with saline or hydrogel Prim Dressing: VAC ary 3 x Per Week/30 Days Secondary Dressing: Zetuvit Plus 4x8 in (Generic) 3 x Per Week/30 Days Discharge Instructions: Apply over primary dressing as directed. Secured With: Elastic Bandage 4 inch (ACE bandage) (Generic) 3 x Per Week/30 Days Discharge Instructions: Secure with ACE bandage as directed. Secured With: 6M Medipore SPublic affairs consultantSurgical T 2x10 (in/yd) (Generic) 3 x Per Week/30 Days ape Discharge  Instructions: Secure with tape as directed. Compression Wrap: Kerlix Roll 4.5x3.1 (in/yd) (Generic) 3 x Per Week/30 Days Discharge Instructions: Apply Kerlix and Coban compression as directed. Electronic Signature(s) Signed: 04/06/2022 12:38:13 PM By: CFredirick MaudlinMD FACS Entered By: CFredirick Maudlinon 04/06/2022  82:42:35 -------------------------------------------------------------------------------- Problem List Details Patient Name: Date of Service: [name] D 04/06/2022 8:45 A M Medical Record Number: [id number] Patient Account Number: [id number] Date of Birth/Sex: Treating RN: Dec 08, 1985 (37 y.o. M) Primary Care Provider: [name] Other Clinician: Referring Provider: Treating Provider/Extender: [name] in Treatment: 127 Active Problems ICD-10 Encounter Code Description Active Date MDM Diagnosis L97.528 Non-pressure chronic ulcer of other part of left foot with other specified 10/24/2019 No Yes severity E11.621 Type 2 diabetes mellitus with foot ulcer 10/24/2019 No Yes Inactive Problems ICD-10 Code Description Active Date Inactive Date L97.518 Non-pressure chronic ulcer of other part of right foot with other specified severity 07/14/2020 07/14/2020 Tasia Catchings, [name] ([id number]) [id number] Page 10 of 20 L97.518 Non-pressure chronic ulcer of other part of right foot with other specified severity 10/24/2019 10/24/2019 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, right ankle and foot 10/24/2019 10/24/2019 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 08/10/2020 08/10/2020 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, left ankle and foot 10/24/2019 10/24/2019 L97.322 Non-pressure chronic ulcer of left ankle with fat layer exposed 09/29/2021 09/29/2021 B95.62 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 10/24/2019 10/24/2019 classified elsewhere Resolved  Problems Electronic Signature(s) Signed: 04/06/2022 9:35:54 AM By: [name] MD FACS Entered By: [name] on 04/06/2022 09:35:54 -------------------------------------------------------------------------------- Progress Note Details Patient Name: Date of Service: [name], [name] 04/06/2022 8:45 A M Medical Record Number: [id number] Patient Account Number: [id number] Date of Birth/Sex: Treating RN: 10-05-1985 (37 y.o. 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 He comes in for review of a wound over the plantar fifth metatarsal head extending into the lateral part of the foot. He was followed for this previously by his podiatrist Dr. Cornelius Moras. As the patient tells his story he went to see podiatry first for a swelling he developed on the lateral part of his fifth metatarsal head in May. He states this was "open" by podiatry and the area closed. He was followed up in June and it was again opened callus removed and it closed promptly. There were plans being made for surgery on the fifth metatarsal head in June however his blood sugar was apparently too high for anesthesia. Apparently the area was debrided and opened again in June and it is never closed since. Looking over the records from podiatry I am really not able to follow this. It was clear when he was first seen it was before 5/14 at that point he  already had a wound. By 5/17 the ulcer was resolved. I do not see anything about a procedure. On 5/28 noted to have pre-ulcerative moderate keratosis. X-ray noted 1/5 contracted toe and tailor's bunion and metatarsal deformity. On a visit date on 09/28/2018 the dorsal part of the left foot it healed and resolved. There was concern about swelling in his lower extremity he was sent to the ER.. As far as I can tell he was seen in the ER on 7/12 with an ulcer on his left foot. A  DVT rule out of the left leg was negative. I do not think I have complete records from podiatry but I am not able to verify the procedures this patient states he had. He states after the last procedure the wound has never closed although I am not able to follow this in the records I have from podiatry. He has not had a recent x-ray The patient has been using Neosporin on the wound. He is wearing a Darco shoe. He is still very active up on his foot working and exercising. Past medical history; type 2 diabetes ketosis-prone, leg swelling with a negative DVT study in July. Non-smoker ABI in our clinic was 0.85 on the left 10/16; substantial wound on the plantar left fifth met head extending laterally almost to the dorsal fifth MTP. We have been using silver alginate we gave him a Darco forefoot off loader. An x-ray did not show evidence of osteomyelitis did note soft tissue emphysema which I think was due to gas tracking through an open wound. There is no doubt in my mind he requires an MRI 10/23; MRI not booked until 3 November at the earliest this is largely due to his glucose sensor in the right arm. We have been using silver alginate. There has been an improvement [place], [name] ([id number]) [id number] Page 11 of 20 10/29; I am still not exactly sure when his MRI is booked for. He says it is the third but it is the 10th in epic. This definitely needs to be done. He is running a low-grade fever today  but no other symptoms. No real improvement in the 1 02/26/2019 patient presents today for a follow-up visit here in our clinic he is last been seen in the clinic on October 29. Subsequently we were working on getting MRI to evaluate and see what exactly was going on and where we would need to go from the standpoint of whether or not he had osteomyelitis and again what treatments were going be required. Subsequently the patient ended up being admitted to the hospital on 02/07/2019 and was discharged on 02/14/2019. This is a somewhat interesting admission with a discharge diagnosis of pneumonia due to COVID-19 although he was positive for COVID-19 when tested at the urgent care but negative x2 when he was actually in the hospital. With that being said he did have acute respiratory failure with hypoxia and it was noted he also have a left foot ulceration with osteomyelitis. With that being said he did require oxygen for his pneumonia and I level 4 L. He was placed on antivirals and steroids for the COVID-19. He was also transferred to the [facility] at one point. Nonetheless he did subsequently discharged home and since being home has done much better in that regard. The CT angiogram did not show any pulmonary embolism. With regard to the osteomyelitis the patient was placed on vancomycin and Zosyn while in the hospital but has been changed to Augmentin at discharge. It was also recommended that he follow- up with wound care and podiatry. Podiatry however wanted him to see [name] according to the patient prior to them doing anything further. His hemoglobin A1c was 9.9 as noted in the hospital. Have an MRI of the left foot performed while in the hospital on 02/04/2019. This showed evidence of septic arthritis at the fifth MTP joint and osteomyelitis involving the fifth metatarsal head and proximal phalanx. There is an overlying plantar open wound noted an abscess tracking back along the lateral  aspect of  the fifth metatarsal shaft. There is otherwise diffuse cellulitis and mild fasciitis without findings of polymyositis. The patient did have recently pneumonia secondary to COVID-19 I looked in the chart through epic and it does appear that the patient may need to have an additional x-ray just to ensure everything is cleared and that he has no airspace disease prior to putting him into the [name]. 03/05/2019; patient was readmitted to the clinic last week. He was hospitalized twice for a viral upper respiratory tract infection from 11/1 through 11/4 and then 11/5 through 11/12 ultimately this turned out to be Covid pneumonitis. Although he was discharged on oxygen he is not using it. He says he feels fine. He has no exercise limitation no cough no sputum. His O2 sat in our clinic today was 100% on room air. 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 He received Vanco and Zosyn in the hospital and then was discharged on 2 weeks of Augmentin. I do not see any relevant cultures. He was supposed to follow-up with infectious disease but I do not see that he has an appointment. 12/8; patient saw Dr. Novella Olive of infectious disease last week. He felt that he had had adequate antibiotic therapy. He did not go to follow-up with Dr. Amalia Hailey of podiatry and I have again talked to him about the pros and cons of this. He does not want to consider a ray amputation of this time. He is aware of the risks of recurrence, migration etc. He started HBO today and tolerated this well. He can complete the Augmentin that I gave him last week. I have looked over the lab work that Dr. Chana Bode ordered his C-reactive protein was 3.3 and his sedimentation rate was 17. The C-reactive protein is never really been measurably that high in this patient 12/15; not much change in the wound today however he has undermining along  the lateral part of the foot again more extensively than last week. He has some rims of epithelialization. We have been using silver alginate. He is undergoing hyperbarics but did not dive today 12/18; in for his obligatory first total contact cast change. Unfortunately there was pus coming from the undermining area around his fifth metatarsal head. This was cultured but will preclude reapplication of a cast. He is seen in conjunction with HBO 12/24; patient had staph lugdunensis in the wound in the undermining area laterally last time. We put him on doxycycline which should have covered this. The wound looks better today. I am going to give him another week of doxycycline before reattempting the total contact cast 12/31; the patient is completing antibiotics. Hemorrhagic debris in the distal part of the wound with some undermining distally. He also had hyper granulation. Extensive debridement with a #5 curette. The infected area that was on the lateral part of the fifth met head is closed over. I do not think he needs any more antibiotics. Patient was seen prior to HBO. Preparations for a total contact cast were made in the cast will be placed post hyperbarics 04/11/19; once again the patient arrives today without complaint. He had been in a cast all week noted that he had heavy drainage this week. This resulted in large raised areas of macerated tissue around the wound 1/14; wound bed looks better slightly smaller. Hydrofera Blue has been changing himself. He had a heavy drainage last week  which caused a lot of maceration around the wound so I took him out of a total contact cast he says the drainage is actually better this week He is seen today in conjunction with HBO 1/21; returns to clinic. He was up in [place] for a day or 2 attending a funeral. He comes back in with the wound larger and with a large area of exposed bone. He had osteomyelitis and septic arthritis of the fifth left metatarsal head  while he was in hospital. He received IV antibiotics in the hospital for a prolonged period of time then 3 weeks of Augmentin. Subsequently I gave him 2 weeks of doxycycline for more superficial wound infection. When I saw this last week the wound was smaller the surface of the wound looks satisfactory. 1/28; patient missed hyperbarics today. Bone biopsy I did last time showed Enterococcus faecalis and Staphylococcus lugdunensis . He has a wide area of exposed bone. We are going to use silver alginate as of today. I had another ethical discussion with the patient. 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 In this situation I think the likelihood of healing this is low. Therefore I have recommended a ray amputation and with the patient's agreement I have referred him to Dr. Doran Durand. The other issue is that his compliance with hyperbarics has been minimal because of his work schedule and given his underlying decision I am going to stop this today READMISSION 10/24/2019 MRI 09/29/2019 left foot IMPRESSION: 1. Apparent skin ulceration inferior and lateral to the 5th metatarsal base with underlying heterogeneous T2 signal and enhancement in the subcutaneous fat. Small peripherally enhancing fluid collections along the plantar and lateral aspects of the 5th metatarsal base suspicious for abscesses. 2. Interval amputation through the mid 5th metatarsal with nonspecific low-level marrow edema and enhancement. Given the proximity to the adjacent soft tissue inflammatory changes, osteomyelitis cannot be excluded. 3. The additional bones appear unremarkable. MRI 09/29/2019 right foot IMPRESSION: 1. Soft tissue ulceration lateral to the 5th MTP joint. There is low-level T2 hyperintensity within the 4th and 5th metatarsal heads and adjacent proximal phalanges without abnormal T1 signal or cortical destruction. These findings are nonspecific and could be seen with early  marrow edema, hyperemia or early osteomyelitis. No evidence of septic joint. 2. Mild tenosynovitis and synovial enhancement associated with the extensor digitorum tendons at the level of the midfoot. 3. Diffuse low-level muscular T2 hyperintensity and enhancement, most consistent with diabetic myopathy. LEFT FOOT BONE Novacek, [name] ([id number]) [id number] Page 12 of 20 Methicillin resistant staphylococcus aureus Staphylococcus lugdunensis MIC MIC CIPROFLOXACIN >=8 RESISTANT Resistant <=0.5 SENSI... Sensitive CLINDAMYCIN <=0.25 SENS... Sensitive >=8 RESISTANT Resistant ERYTHROMYCIN >=8 RESISTANT Resistant >=8 RESISTANT Resistant GENTAMICIN <=0.5 SENSI... Sensitive <=0.5 SENSI... Sensitive Inducible Clindamycin NEGATIVE Sensitive NEGATIVE Sensitive OXACILLIN >=4 RESISTANT Resistant 2 SENSITIVE Sensitive RIFAMPIN <=0.5 SENSI... Sensitive <=0.5 SENSI... Sensitive TETRACYCLINE <=1 SENSITIVE Sensitive <=1 SENSITIVE Sensitive TRIMETH/SULFA <=10 SENSIT Sensitive <=10 SENSIT Sensitive ... Marland Kitchen.. VANCOMYCIN 1 SENSITIVE Sensitive <=0.5 SENSI... Sensitive Right foot bone . Component 3 wk ago Specimen Description BONE Special Requests RIGHT 4 METATARSAL SAMPLE B Gram Stain NO WBC SEEN NO ORGANISMS SEEN Culture RARE METHICILLIN RESISTANT STAPHYLOCOCCUS AUREUS NO ANAEROBES ISOLATED Performed at [facility], [place] [address], [place], [place] [address] Report Status 10/08/2019 FINAL Organism ID, Bacteria METHICILLIN RESISTANT STAPHYLOCOCCUS AUREUS Resulting Agency [facility] Susceptibility Methicillin resistant staphylococcus aureus MIC CIPROFLOXACIN >=8 RESISTANT Resistant CLINDAMYCIN <=0.25 SENS... Sensitive ERYTHROMYCIN >=8 RESISTANT Resistant GENTAMICIN <=0.5 SENSI.Marland KitchenMarland Kitchen  Sensitive Inducible Clindamycin NEGATIVE Sensitive OXACILLIN >=4 RESISTANT Resistant RIFAMPIN <=0.5 SENSI... Sensitive TETRACYCLINE <=1 SENSITIVE Sensitive TRIMETH/SULFA <=10 SENSIT  Sensitive ... VANCOMYCIN 1 SENSITIVE Sensitive This is a patient we had in clinic earlier this year with a wound over his left fifth metatarsal head. 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 Doran Durand on 05/16/2019. He seems to have developed problems with wounds on his bilateral feet in June 2021 although he may have had problems earlier than this. He was in an urgent care with a right foot ulcer on 09/26/2019 and given a course of doxycycline. This was apparently after having trouble getting into see orthopedics. He was seen by podiatry on 09/28/2019 noted to have bilateral lower extremity ulcers including the left lateral fifth metatarsal base and the right subfifth met head. It was noted that had purulent drainage at that time. He required hospitalization from 6/20 through 7/2. This was because of worsening right foot wounds. He underwent bilateral operative incision and drainage and bone biopsies bilaterally. Culture results are listed above. He has been referred back to clinic by Dr. Jacqualyn Posey of podiatry. He is also followed by Dr. Megan Salon who saw him yesterday. He was discharged from hospital on Zyvox Flagyl and Levaquin and yesterday changed to doxycycline Flagyl and Levaquin. His inflammatory markers on 6/26 showed a sedimentation rate of 129 and a C-reactive protein of 5. This is improved to 14 and 1.3 respectively. This would indicate improvement. ABIs in our clinic today were 1.23 on the right and 1.20 on the left 11/01/2019 on evaluation today patient appears to be doing fairly well in regard to the wounds on his feet at this point. Fortunately there is no signs of active infection at this time. No fevers, chills,  nausea, vomiting, or diarrhea. He currently is seeing infectious disease and still under their care at this point. Subsequently he also has both wounds which she has not been using collagen on as he did not receive that in his packaging he did not call us and let us know that. Apparently that just was missed on the order. Nonetheless we will get that straightened out today. 8/9-Patient returns for bilateral foot wounds, using Prisma with hydrogel moistened dressings, and the wounds appear stable. Patient using surgical shoes, avoiding much pressure or weightbearing as much as possible 8/16; patient has bilateral foot wounds. 1 on the right lateral foot proximally the other is on the left mid lateral foot. Both required debridement of callus and thick skin around the wounds. We have been using silver collagen 8/27; patient has bilateral lateral foot wounds. The area on the left substantially surrounded by callus and dry skin. This was removed from the wound edge. The underlying wound is small. The area on the right measured somewhat smaller today. 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 Unfortunately I did not update his antibiotics during today's visit. 9/10 I reviewed Dr. Hale Bogus last notes he felt he had completed antibiotics his inflammatory markers were reasonably well controlled. He has a small wound on the lateral left foot and a tiny area on the right which is just above closed. He is using Hydrofera Blue with border foam he has bilateral  surgical shoes 9/24; 2 week f/u. doing well. right foot is closed. left foot still undermined. 10/14; right foot remains closed at the fifth met head. The area over the base of the left fifth metatarsal has a small open area but considerable undermining towards the plantar foot. Thick callus skin around this suggests an adequate pressure relief. We have talked about this. He says he is  going to go back into his cam boot. I suggested a total contact cast he did not seem enamored with this suggestion 10/26; left foot base of the fifth metatarsal. Same condition as last time. He has skin over the area with an open wound however the skin is not adherent. He went to see Dr. Earleen Newport who did an x-ray and culture of his foot I have not reviewed the x-ray but the patient was not told anything. He is on doxycycline 11/11; since the patient was last here he was in the emergency room on 10/30 he was concerned about swelling in the left foot. They did not do any cultures or x-rays. They changed his antibiotics to cephalexin. Previous culture showed group B strep. The cephalexin is appropriate as doxycycline has less than predictable coverage. Arrives in clinic today with swelling over this area under the wound. He also has a new wound on the right fifth metatarsal head 11/18; the patient has a difficult wound on the lateral aspect of the left fifth metatarsal head. The wound was almost ballotable last week I opened it slightly expecting to see purulence however there was just bleeding. I cultured this this was negative. X-ray unchanged. We are trying to get an MRI but I am not sure were going to be able to get this through his insurance. He also has an area on the right lateral fifth metatarsal head this looks healthier 12/3; the patient finally got our MRI. Surprisingly this did not show osteomyelitis. I did show the soft tissue ulceration at the lateral plantar aspect of the fifth metatarsal base with a tiny residual 6 mm abscess overlying the superficial fascia I have tried to culture this area I have not been able to get this to grow anything. Nevertheless the protruding tissue looks aggravated. I suspect we should try to treat the underlying "abscess with broad-spectrum antibiotics. I am going to start him on Levaquin and Flagyl. He has much less edema in his legs and I am going to continue  to wrap his legs and see him weekly 12/10. I started Levaquin and Flagyl on him last week. He just picked up the Flagyl apparently there was some delay. The worry is the wound on the left fifth metatarsal base which is substantial and worsening. His foot looks like he inverts at the ankle making this a weightbearing surface. Certainly no improvement in fact I think the measurements of this are somewhat worse. We have been using [place], [name] ([id number]) [id number] Page 13 of 20 12/17; he apparently just got the Levaquin yesterday this is 2 weeks after the fact. He has completed the Flagyl. The area over the left fifth metatarsal base still has protruding granulation tissue although it does not look quite as bad as it did some weeks ago. He has severe bilateral lymphedema although we have not been treating him for wounds on his legs this is definitely going to require compression. There was so much edema in the left I did not wish to put him in a total contact cast today. I am going to increase his compression  from 3-4 layer. The area on the right lateral fifth met head actually look quite good and superficial. 12/23; patient arrived with callus on the right fifth met head and the substantial hyper granulated callused wound on the base of his fifth metatarsal. He says he is completing his Levaquin in 2 days but I do not think that adds up with what I gave him but I will have to double check this. We are using Hydrofera Blue on both areas. My plan is to put the left leg in a cast the week after New Year's 04/06/2020; patient's wounds about the same. Right lateral fifth metatarsal head and left lateral foot over the base of the fifth metatarsal. There is undermining on the left lateral foot which I removed before application of total contact cast continuing with Hydrofera Blue new. Patient tells me he was seen by endocrinology today lab work was done [Dr. Kerr]. Also  wondering whether he was referred to cardiology. I went over some lab work from previously does not have chronic renal failure certainly not nephrotic range proteinuria he does have very poorly controlled diabetes but this is not his most updated lab work. Hemoglobin A1c has been over 11 1/10; the patient had a considerable amount of leakage towards mid part of his left foot with macerated skin however the wound surface looks better the area on the right lateral fifth met head is better as well. I am going to change the dressing on the left foot under the total contact cast to silver alginate, continue with Hydrofera Blue on the right. 1/20; patient was in the total contact cast for 10 days. Considerable amount of drainage although the skin around the wound does not look too bad on the left foot. The area on the right fifth metatarsal head is closed. Our nursing staff reports large amount of drainage out of the left lateral foot wound 1/25; continues with copious amounts of drainage described by our intake staff. PCR culture I did last week showed E. coli and Enterococcus faecalis and low quantities. Multiple resistance genes documented including extended spectrum beta lactamase, MRSA, MRSE, quinolone, tetracycline. The wound is not quite as good this week as it was 5 days ago but about the same size 2/3; continues with copious amounts of malodorous drainage per our intake nurse. The PCR culture I did 2 weeks ago showed E. coli and low quantities of Enterococcus. There were multiple resistance genes detected. I put Neosporin on him last week although this does not seem to have helped. The wound is slightly deeper today. Offloading continues to be an issue here although with the amount of drainage she has a total contact cast is just not going to work 2/10; moderate amount of drainage. Patient reports he cannot get his stocking on over the dressing. I told him we have to do that the nurse gave  him suggestions on how to make this work. The wound is on the bottom and lateral part of his left foot. Is cultured predominantly grew low amounts of Enterococcus, E. coli and anaerobes. There were multiple resistance genes detected including extended spectrum beta lactamase, quinolone, tetracycline. I could not think of an easy oral combination to address this so for now I am going to do topical antibiotics provided by [facility] I think the main agents here are vancomycin and an aminoglycoside. We have to be able to give him access to the wounds to get the topical antibiotic on 2/17; moderate amount of drainage this  is unchanged. He has his Keystone topical antibiotic against the deep tissue culture organisms. He has been using this and changing the dressing daily. Silver alginate on the wound surface. 2/24; using Keystone antibiotic with silver alginate on the top. He had too much drainage for a total contact cast at one point although I think that is improving and I think in the next week or 2 it might be possible to replace a total contact cast I did not do this today. In general the wound surface looks healthy however he continues to have thick rims of skin and subcutaneous tissue around the wide area of the circumference which I debrided 06/04/2020 upon evaluation today patient appears to be doing well in regard to his wound. I do feel like he is showing signs of improvement. There is little bit of callus and dead tissue around the edges of the wound as well as what appears to be a little bit of a sinus tract that is off to the side laterally I would perform debridement to clear that away today. 3/17; left lateral foot. The wound looks about the same as I remember. Not much depth surface looks healthy. No evidence of infection 3/25; left lateral foot. Wound surface looks about the same. Separating epithelium from the circumference. There really is no evidence of infection here however not making  progress by my view 3/29; left lateral foot. Surface of the wound again looks reasonably healthy still thick skin and subcutaneous tissue around the wound margins. There is no evidence of infection. One of the concerns being brought up by the nurses has again the amount of drainage vis--vis continued use of a total contact cast 4/5; left lateral foot at roughly the base of the fifth metatarsal. Nice healthy looking granulated tissue with rims of epithelialization. The overall wound measurements are not any better but the tissue looks healthy. The only concern is the amount of drainage although he has no surrounding maceration with what we have been doing recently to absorb fluid and protect his skin. He also has lymphedema. 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 He states that the kerlix/coban under the TCC rolled down and could not get it back up. He has been using an offloading scooter and has somehow hurt his right foot using this device. This happened last week. He states that the side of his right foot developed a blister and opened. The top of his foot also has a few small open wounds he thinks is due to his socks rubbing in his shoes. He has not been using any dressings to the wound. He denies purulent drainage, fever/chills or erythema to the wounds. 4/22; patient presents for 1 week follow-up. He developed new wounds to the right foot that were evaluated at last clinic visit. He continues to have a total contact cast to the left leg and he reports no issues. He has been using silver collagen to the right foot wounds with no issues. He denies purulent drainage, fever/chills or erythema to the right foot wounds. He has no complaints today 4/25;  patient presents for 1 week follow-up. He has a total contact cast of the left leg and reports no issues. He has been using silver  alginate to the right foot wound. He denies purulent drainage, fever/chills or erythema to the right foot wounds. 5/2 patient presents for 1 week follow-up. T contact cast on the left. The wound which is on the base of the plantar foot at the base of the fifth metatarsal otal actually looks quite good and dimensions continue to gradually contract. HOWEVER the area on the right lateral fifth metatarsal head is much larger than what I remember from 2 weeks ago. Once more is he has significant levels of hypergranulation. Noteworthy that he had this same hyper granulated response on his wound on the left foot at one point in time. So much so that he I thought there was an underlying fluid collection. Based on this I think this just needs debridement. 5/9; the wound on the left actually continues to be gradually smaller with a healthy surface. Slight amount of drainage and maceration of the skin around but not too bad. However he has a large wound over the right fifth metatarsal head very much in the same configuration as his left foot wound was initially. I used silver nitrate to address the hyper granulated tissue no mechanical debridement 5/16; area on the left foot did not look as healthy this week deeper thick surrounding macerated skin and subcutaneous tissue. oo The area on the right foot fifth met head was about the same oo The area on the right ankle that we identified last week is completely broken down into an open wound presumably a stocking rubbing issue 5/23; patient has been using a total contact cast to the left side. He has been using silver alginate underneath. He has also been using silver alginate to the right foot wounds. He has no complaints today. He denies any signs of infection. 5/31; the left-sided wound looks some better measure smaller surface  granulation looks better. We have been using silver alginate under the total contact cast oo The large area on his right fifth met head and right dorsal foot look about the same still using silver alginate 6/6; neither side is good as I was hoping although the surface area dimensions are better. A lot of maceration on his left and right foot around the wound edge. Area on the dorsal right foot looks better. He says he was traveling. I am not sure what does the amount of maceration around the plantar wounds may be drainage issues 6/13; in general the wound surfaces look quite good on both sides. Macerated skin and raised edges around the wound required debridement although in general especially on the left the surface area seems improved. oo The area on the right dorsal ankle is about the same I thought this would not be such a problem to close 6/20; not much change in either wound although the one on the right looks a little better. Both wounds have thick macerated edges to the skin requiring debridements. We have been using silver alginate. The area on the dorsal right ankle is still open I thought this would be closed. Lichtenberg, [name] ([id number]) [id number] Page 14 of 20 6/28; patient comes in today with a marked deterioration in the right foot wound fifth met head. Wide area of exposed bone this is a drastic change from last time. The area on the left there we have been casting is stagnant. We have been using silver alginate in both wound areas. 7/5; bone culture I did for PCR last time was positive for Pseudomonas, group B strep, Enterococcus and Staph aureus. There  was no suggestion of methicillin resistance or ampicillin resistant genes. This was resistant to tetracycline however He comes into the clinic today with the area over his right plantar fifth metatarsal head which had been doing so well 2 weeks ago completely necrotic feeling bone. I do not know that  this is going to be salvageable. The left foot wound is certainly no smaller but it has a better surface and is superficial. 7/8; patient called in this morning to say that his total contact cast was rubbing against his foot. He states he is doing fine overall. He denies signs of infection. 7/12; continued deterioration in the wound over the right fifth metatarsal head crumbling bone. This is not going to be salvageable. The patient agrees and wants to be referred to Dr. Doran Durand which we will attempt to arrange as soon as possible. I am going to continue him on antibiotics as long as that takes so I will renew those today. The area on the left foot which is the base of the fifth metatarsal continues to look somewhat better. Healthy looking tissue no depth no debridement is necessary here. 7/20; the patient was kindly seen by Dr. Doran Durand of orthopedics on 10/19/2020. 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 Towards this end we have taken him out of the total contact cast on the left we will put him in a wrap with Hydrofera Blue. As I understand things surgery is planned for 7/21 7/27; patient had his surgery last Thursday. 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 Apparently everything went well we did not still disturb that today The area on the left foot actually looks quite good. He has been much less mobile which probably explains this he did not seem to do well in the total contact cast secondary to drainage and maceration I think. 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 Fortunately there is no evidence of active infection at this time. No fevers, chills, nausea, vomiting, or diarrhea. Overall I think that he is actually doing extremely well. Nonetheless I do believe that he is staying off of  this more following the surgery in his right foot that is the reason the left is doing so great. 8/16; left plantar foot wound. This looks smaller than the last time I saw this he is using Hydrofera Blue. The surgical wound on the right foot is being followed by Dr. Doran Durand we did not look at this today. He has surgical shoes on both feet 8/23; left plantar foot wound not as good this week. Surrounding macerated skin and subcutaneous tissue everything looks moist and wet. I do not think he is offloading this adequately. He is using a surgical shoe Apparently the right foot surgical wound is not open although I did not check his foot 8/31; left plantar foot lateral aspect. Much improved this week. He has no maceration. Some improvement in the surface area of the wound but most impressively the depth is come in we are using silver alginate. The patient is a Product/process development scientist. He is asked that we write him a letter so he can go back to work. I have also tried to see if we can write something that will allow him to limit the amount of time that he  is on his foot at work. 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 Hopefully the latter can be avoided 9/6; I believe the patient missed an appointment last week. He arrives in today with a wound looking roughly the same certainly no better. Undermining laterally and also inferiorly. We used molecuLight today in training with the patient's permission.. We are using silver alginate 9/21 wound is measuring bigger this week although this may have to do with the aggressive circumferential debridement last week in response to the blush fluorescence on the MolecuLight. Culture I did last week showed significant MSSA and E. coli. I put him on Augmentin but he has not started it yet. We are also going to send this for compounded antibiotics at [facility]. There is no evidence of systemic  infection 9/29; silver alginate. His Keystone arrived. He is completing Augmentin in 2 days. Offloading in a cam boot. Moderate drainage per our intake staff 10/5; using silver alginate. He has been using his [place]. He has completed his Augmentin. Per our intake nurse still a lot of drainage, far too much to consider a total contact cast. Wound measures about the same. He had the same undermining area that I defined last week from a roughly 11-3. I remove this today 10/12; using silver alginate he is using the [place]. He comes in for a nurse visit hence we are applying [employer] twice a week. Measuring slightly better today and less notable drainage. Extensive debridement of the wound edge last time 10/18; using topical Keystone and silver alginate and a soft cast. Wound measurements about the same. Drainage was through his soft cast. We are changing this twice a week Tuesdays and Friday 10/25; comes in with moderate drainage. Still using Keystone silver alginate and a soft cast. Wound dimensions completely the same.He has a lot of edema in the left leg he has lymphedema. Asking for [name] to consider wrapping him as he cannot get his stocking on over the soft cast 11/2; comes in with moderate to large drainage slightly smaller in terms of width we have been using [place]. His wound looks satisfactory but not much improvement 11/4; patient presents today for obligatory cast change. Has no issues or complaints today. He denies signs of infection. 11/9; patient traveled this weekend to [place], was on the cast quite a bit. Staining of the cast with black material from his walking boot. Drainage was not quite as bad as we feared. Using silver alginate and Keystone 11/16; we do not have size for cast therefore we have been putting a soft cast on him since the change on Friday. Still a significant amount of drainage necessitating changing twice a week. We have been using the Keystone at cast changes either  hard or soft as well as silver alginate Comes in the clinic with things actually looking fairly good improvement in width. He says his offloading is about the same 02/24/2021 upon evaluation today patient actually comes back in and is doing excellent in regard to his foot ulcer this is significantly smaller even compared to the last visit. The soft cast seems to have done extremely well for him which is great news. I do not see any signs of infection minimal debridement will be needed today. 11/30; left lateral foot much improved half a centimeter improvement in surface area. No evidence of infection. He seems to be doing better with the soft cast in the  TCC therefore we will continue with this. He comes back in later in the week for a change with the nurses. This is due to drainage 12/6; no improvement in dimensions. Under illumination some debris on the surface we have been using silver alginate, soft cast. If there is anything optimistic here he seems to have have less drainage 12/13. Dimensions are improved both length and width and slightly in depth. Appears to be quite healthy today. Raised edges of this thick skin and callus around the edges however. He is in a soft cast were bringing him back once for a change on Friday. Drainage is better 12/20. Dimensions are improved. He still has raised edges of thick skin and callus around the edges. We are using a soft cast 12/28; comes in today with thick callus around the wound. Using silver under alginate under a soft cast. I do not think there is much improvement in any measurement 2023 04/06/2021; patient was put in a total contact cast. Unfortunately not much change in surface area 1/10; not much different still thick callus and skin around the edge in spite of the total contact cast. This was just debrided last week we have been using the [facility] compounded antibiotic and silver alginate under a total contact cast 1/18 the patient's wound on the  left side is doing nicely. smaller Prentice, [name] ([id number]) [id number] Page 15 of 20 HOWEVER he comes in today with a wound on the right foot laterally. blister most likely serosangquenous drainage 1/24; the patient continues to do well in terms of the plantar left foot which is continued to contract using silver alginate under the total contact cast HOWEVER the right lateral foot is bigger with denuded skin around the edges. I used pickups and a #15 scalpel to remove this this looks like the remanence of a large blister. Cannot rule out infection. Culture in this area I did last week showed Staphylococcus lugdunensis few colonies. I am going to try to address this with his [employer] antibiotic that is done so well on the left having linezolid and this should cover the staph 2/1; the patient's wound on his left foot which was the original plantar foot wound thick skin and eschar around the edges even in the total contact cast but the wound surface does not look too bad The real problem is on how his right lateral foot at roughly the base of the fifth metatarsal. The wound is completely necrotic more worrisome than that there is swelling around the edges of this. We have been using silver alginate on both wounds and Keystone on the right foot. Unfortunately I think he is going to require systemic antibiotics while we await cultures. He did not get the x-ray done that we ordered last week [lost the prescription 2/7; disappointingly in the area on the left foot which we are treating with a total contact cast is still not closed although it is much smaller. He continues to have a lot of callus around the wound edge. -Right lateral foot culture I did last week was negative x-ray also negative for osteomyelitis. 2/15: TCC silver alginate on the left and silver alginate on the right lateral. No real improvement in either area 05/26/2021: T oday, the wounds are roughly the same  size as at his previous visit, post-debridement. He continues to endorse fairly substantial drainage, particularly on the right. He has been in a total contact cast on the left. There is still some callus surrounding this lesion. On the  right, the periwound skin is quite macerated, along with surrounding callus. The center of the right-sided wound also has some dark, densely adherent material, which is very difficult to remove. 06/02/2021: Today, both wounds are slightly smaller. He has been using zinc oxide ointment around the right ulcer and the degree of maceration has improved markedly. There continues to be an area of nonviable tissue in the center of the right sided ulcer. The left-sided wound, which has been in the total contact cast. Appears clean and the degree of callus around it is less than previously. 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 He had to take the stairs and both wounds have increased in size. The left foot, which has been in a total contact cast, has developed a tunnel tracking to the lateral aspect of his foot. The nonviable tissue in the center of the right-sided ulcer remains recalcitrant to debridement. There is significant undermining surrounding the entirety of the left sided wound. 06/16/2021: The elevator at school has been fixed and the patient has been able to avoid putting as much weight on his wounds over the past week. We converted the left foot wound into a single lesion today, but despite this, the wound is actually smaller. The base is healthy with limited periwound callus. On the right, the central necrotic area is still present. He continues to be quite macerated around the right sided wound, despite applying barrier cream. This does, however, have the benefit of softening the callus to make it more easily removable. 06/23/2021: Today, the left wound is smaller. The lateral aspect that had opened  up previously is now closed. The wound base has a healthy bed of granulation tissue and minimal slough. Unfortunately, on the right, the wound is larger and continues to be fairly macerated. He has also reopened the wound at his right ankle. He thinks this is due to the gait he has adopted secondary to his total contact cast and boot. 06/30/2021: T oday, both wounds are a little bit larger. The lateral aspect on the left has remained closed. He continues to have significant periwound maceration. The culture that I took from the right sided wound grew a population of bacteria that is not covered by his current [facility] antibiotic. The center of the right- sided wound continues to appear necrotic with nonviable fat. It probes deeper today, but does not reach bone. 07/07/2021: The periwound maceration is a little bit less today. The right lateral foot wound has some areas that appear more viable and the necrotic center also looks a little bit better. The wound on the dorsal surface of his right foot near the ankle is contracting and the surface appears healthy. The left plantar wound surface looks healthy, but there is some new undermining on the medial portion. He did get his new Keystone antibiotic and began applying that to the right foot wound on Saturday. 07/14/2021: The intake nurse reported substantial drainage from his wounds, but the periwound skin actually looks better than is typical for him. The wound on the dorsal surface of his right foot near the ankle is smaller and just has a small open area underneath some dried eschar. The left plantar wound surface looks healthy and there has been no significant accumulation of callus. The right lateral foot wound looks quite a bit better, with the central portion, which typically appears necrotic, looking more viable albeit pale. 07/22/2021: His left foot is extremely macerated  today. The wound is about the same size. The wound on the dorsal surface of his  right foot near the ankle had closed, but he traumatized it removing the dressing and there is a tiny skin tear in that location. The right lateral foot wound is bigger, but the surface appears healthy. 07/30/2021: The wound on the dorsal surface of his right foot near the ankle is closed. The right lateral foot wound again is a little bit bigger due to some undermining. The periwound skin is in better condition, however. He has been applying zinc oxide. The wound surface is a little bit dry today. On the left, he does not have the substantial maceration that we frequently see. The wound itself is smaller and has a clean surface. 08/06/2021: Both wounds seem to have deteriorated over the past week. The right lateral foot wound has a dry surface but the periwound is boggy.. Overall wound dimensions are about the same. On the left, the wound is about the same size, but there is more undermining present underneath periwound callus. 08/13/2021: The right sided wound looks about the same, but on the left there has been substantial deterioration. The undermining continues to extend under periwound callus. Once this was removed, substantial extension of the wound was present. There is no odor or purulent drainage but clearly the wounds have broken down. 08/20/2021: The wounds look about the same today. He has been out of his total contact cast and has just been changing the dressings using topical Keystone with PolyMem Ag, Kerlix and Ace bandages. The wound on the top of his right ankle has reopened but this is quite small. There was a little bit of purulent material that I expressed when examining this wound. 08/24/2021: After the aggressive debridement I performed at his last visit, the wounds actually look a little bit better today. They are smaller with the exception of the wound on the top of his right ankle which is a little bit bigger as some more skin pulled off when he was changing his dressing. We are  using topical Keystone with PolyMem Ag Kerlix and Ace bandages. 09/02/2021: There has been really no change to any of his wounds. 09/16/2021: The patient was hospitalized last week with nausea, vomiting, and dehydration. He says that while he was in the hospital, his wounds were not really addressed properly. T oday, both plantar foot wounds are larger and the periwound skin is macerated. The wound on the dorsum of his right foot has a scab on the top. The right foot now has a crater where previously he had had nonviable fat. It looks as though this simply died and fell out. The periwound callus is wet. 09/24/2021: His wounds have deteriorated somewhat since his last visit. The wound on the dorsum of his right foot near his ankle is larger and has more nonviable tissue present. The crater in his right foot is even deeper; I cannot quite palpate or probe to bone but I am sure it is close. The wound on his left plantar foot has an odd boggy area in the center that almost feels as though it has fluid within it. He has run out of his topical Keystone antibiotic. We are using silver alginate on his wounds. 09/29/2021: He has developed a new wound on the dorsum of his left foot near his ankle. He says he thinks his wrapping is rubbing in that site. I would concur with this as the wound on his right ankle is  larger. The left foot looks about the same. The right foot has the crater that was present last week. No significant slough accumulation, but his foot remains quite swollen and warm despite oral antibiotic therapy. Brent, [name] ([id number]) [id number] Page 16 of 20 10/08/2021: All of his wounds look about the same as last week. He did not start his oral antibiotics that are prescribed until just a couple of days ago; his [employer] compounded antibiotics formula has been changed and he is awaiting delivery of the new recipe. His MRI that was scheduled for earlier this week was  canceled as no prior authorization had been obtained; unfortunately the tech responsible sent an email to my old [place] email, which I no longer use nor have access to. 10/18/2021: The wounds on his bilateral dorsal feet near the ankles are both improved. They are smaller and have just some eschar and slough buildup. The left plantar wound has a fair amount of undermining, but the surface is clean. There is some periwound callus accumulation. On the right plantar foot, there is nonviable fat leading to a deep tunnel that tracks towards his dorsal medial foot. There is periwound callus and slough accumulation, as well. His right foot and leg remain swollen as compared to the left. 10/25/2021: The wounds on his bilateral dorsal feet and at the ankles have broken down somewhat. They are little bit larger than last week. The left plantar wound continues to undermine laterally but the surface is clean. The right plantar foot wound shows some decreased depth in the tunnel tracking towards his dorsal medial foot. He has not yet had the Doppler study that I ordered; it sounds like there is some confusion about the scheduling of the procedure. In addition, the MRI was denied and I have taken steps to appeal the denial. 11/24/2021: Since our last visit, Mr. Jaber was admitted to the hospital where an MRI suggested osteomyelitis. He was taken the operating room by podiatry. Bone biopsies were negative for osteomyelitis. They debrided his wounds and applied myriad matrix. He saw them last week and they removed his staples. He is here today to continue his wound healing process. T oday, both of the dorsal foot/ankle wounds are substantially smaller. There is just a little eschar overlying the left sided wound and some eschar and slough on the right. The right plantar foot ulcer has the healthiest surface of granulation tissue that I have seen to date. A portion of the myriad matrix failed to take and was  hanging loose. It appears that myriad morcells were placed into the tunnel closest to the dorsal portion of his foot. These have sloughed off. The left plantar foot ulcer is about the same size, but has a much healthier surface than in the past. Both plantar ulcers have callus and slough accumulation. 12/07/2021: Left dorsal foot/ankle wound is closed. The right dorsal foot/ankle wound is nearly closed and just has a small open area with some eschar and slough. The right plantar foot wound has contracted quite a bit since our last visit. It has a healthy surface with just a little bit of slough accumulation and periwound callus buildup. The left plantar foot wound is about the same size but the surface appears healthy. There is a little slough and periwound callus on this side, as well. 12/15/2021: Both dorsal foot/ankle wounds are closed. The right plantar foot wound is substantially smaller than at our last visit. The tunneling that was present has nearly closed. There is just  a little bit of slough buildup. The left plantar foot wound is also a little bit smaller today. The surface is the healthiest that I have ever seen it. Light slough and periwound callus accumulation on this side. 12/22/2021: The dorsal ankle/foot wounds remain closed. The right plantar foot wound continues to contract. There is still a bit of depth at the lateral portion of the wound but the surface has a good granulated appearance. The wound on the left is about the same size, the central indented portion is still adherent. It also is clean with good granulation tissue present. The periwound skin and callus are a bit macerated, however. 12/29/2021: Both ankle wounds remain closed. The right plantar foot wound is less than half the size that it was last week. There is no depth any longer and the surface has just a little bit of slough and periwound callus. On the left, the wound is not much smaller, but the surface is healthier with  good granulation tissue. There is also a little slough and periwound callus buildup. 01/05/2022: The right plantar foot wound continues to contract and is once again about half the size as it was last week. There is just a little bit of surface slough and periwound callus. On the left, the depth of the wound has decreased and the diameter has started to contract. It is also very clean with just a little slough and biofilm present. 01/12/2022: The right plantar foot wound is smaller again today. There is just some periwound callus and slough accumulation. On the left, there is a fair amount of undermining and the surface is a little boggy, but no other significant change. 01/19/2022: The right plantar wound continues to contract. There is minimal periwound callus accumulation and just a light layer of slough on the surface. On the left, the surface looks better, but there is fairly significant undermining near the 11:00 portion of the wound, aiming towards his great toe. The skin overlying this area is very healthy and has a good fat layer present. No malodor or purulent drainage. 01/26/2022: The right sided wound continues to contract and has just a light layer of slough on the surface. Minimal periwound callus. On the left, he still has substantial undermining and the tissue surface is not as robust as I would like to see. No malodor or purulent drainage, but he did say he had a lot of serous drainage over the weekend. 02/02/2022: The right foot wound is about a quarter of the size that it was at his last visit. There is just a little slough on the surface and some periwound callus. On the left, the undermining persists and the tissue is still more pale, but he does not seem to have had as much drainage over the last weekend. We are waiting on a wound VAC. 02/09/2022: His right foot has healed. He received the wound VAC, but did not bring it to clinic with him today. The lateral aspect of the left  foot wound has come in a little, but he still has substantial undermining on the medial and distal portion of the wound. Still with macerated periwound callus. 02/16/2022: The wound VAC was applied last Friday. T oday, there has been tremendous improvement in the surface of the wound bed. The undermined portion of the wound has come in a bit. There is still some overhanging dead skin. Overall the wound looks much better. 02/23/2022: No significant change in the overall wound dimensions, but the wound surface continues  to improve and appears healthier and more robust. There is some periwound callus accumulation and overhanging skin. No concern for infection. 03/02/2022: Although the measurements taken in clinic today were unchanged, the visual appearance of the wound looks like it is smaller and the undermining/tunneling is filling with flesh. There is less periwound tissue maceration than usual. No malodor or purulent drainage. No concern for infection. 03/09/2022: The undermining has come in by couple of millimeters. The periwound is a little bit more macerated this week. No concern for infection. 12/13; substantial wound on the left plantar foot. We are using silver collagen and a wound VAC. 03/23/2022: The undermining continues to contract. The wound surface is a little bit dry. 03/30/2022: The undermining has essentially resolved. There is still some depth at the center of the wound. The wound surface has good beefy tissue and the moisture balance is better. 04/06/2022: The wound dimensions are about the same, except that the depth is beginning to fill in. He has had more drainage this week, but there is no obvious concern for infection. Patient History Information obtained from Patient. Family History No family history of Cancer, Diabetes, Hereditary Spherocytosis, Hypertension, Kidney Disease, Lung Disease, Seizures, Stroke, Thyroid Problems, Tuberculosis. Social History Birkhead, [name]  ([id number]) [id number] Page 17 of 20 Never smoker, Marital Status - Single, Alcohol Use - Rarely, Drug Use - No History, Caffeine Use - Never. Medical History Eyes Denies history of Cataracts, Glaucoma, Optic Neuritis Ear/Nose/Mouth/Throat Denies history of Chronic sinus problems/congestion, Middle ear problems Hematologic/Lymphatic Denies history of Anemia, Hemophilia, Human Immunodeficiency Virus, Lymphedema, Sickle Cell Disease Respiratory Denies history of Aspiration, Asthma, Chronic Obstructive Pulmonary Disease (COPD), Pneumothorax, Sleep Apnea, Tuberculosis Cardiovascular Denies history of Angina, Arrhythmia, Congestive Heart Failure, Coronary Artery Disease, Deep Vein Thrombosis, Hypertension, Hypotension, Myocardial Infarction, Peripheral Arterial Disease, Peripheral Venous Disease, Phlebitis, Vasculitis Gastrointestinal Denies history of Cirrhosis , Colitis, Crohnoos, Hepatitis A, Hepatitis B, Hepatitis C Endocrine Patient has history of Type II Diabetes Denies history of Type I Diabetes Immunological Denies history of Lupus Erythematosus, Raynaudoos, Scleroderma Integumentary (Skin) Denies history of History of Burn Musculoskeletal Denies history of Gout, Rheumatoid Arthritis, Osteoarthritis, Osteomyelitis Neurologic Denies history of Dementia, Neuropathy, Quadriplegia, Paraplegia, Seizure Disorder Oncologic Denies history of Received Chemotherapy, Received Radiation Psychiatric Denies history of Anorexia/bulimia, Confinement Anxiety Hospitalization/Surgery History - 11/1-11/06/2018- sepsis foot infection. - 11/4-11/5 02 sats low respiratory distress. Objective Constitutional He is hypertensive, but asymptomatic.Marland Kitchen No acute distress. Vitals Time Taken: 8:37 AM, Height: 77 in, Weight: 280 lbs, BMI: 33.2, Temperature: 98.6 F, Pulse: 92 bpm, Respiratory Rate: 16 breaths/min, Blood Pressure: 164/99 mmHg, Capillary Blood Glucose: 180  mg/dl. Respiratory Normal work of breathing on room air. General Notes: 04/06/2022: The wound dimensions are about the same, except that the depth is beginning to fill in. He has had more drainage this week, but there is no obvious concern for infection. Integumentary (Hair, Skin) Wound #3 status is Open. Original cause of wound was Trauma. The date acquired was: 10/02/2019. The wound has been in treatment 127 weeks. The wound is located on the [place]. The wound measures 3cm length x 3.2cm width x 1.2cm depth; 7.54cm^2 area and 9.048cm^3 volume. There is Fat Layer (Subcutaneous Tissue) exposed. There is no undermining noted, however, there is tunneling at 12:00 with a maximum distance of 1.2cm. There is a medium amount of serosanguineous drainage noted. The wound margin is distinct with the outline attached to the wound base. There is large (67-100%) red granulation within the wound bed. There is  a small (1-33%) amount of necrotic tissue within the wound bed including Adherent Slough. The periwound skin appearance had no abnormalities noted for color. The periwound skin appearance exhibited: Callus, Maceration. Periwound temperature was noted as No Abnormality. 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 - Dr. [name] - Room 1 with [facility] Shower/ Hygiene: May shower and wash wound with soap and water. Negative Presssure Wound Therapy: Wound #3 Left,Lateral,Plantar Foot: Wound Vac to wound continuously at 127m/hg pressure Black Foam Edema Control - Lymphedema / SCD / Other: Avoid standing for long periods of time. Exercise regularly Compression stocking or Garment 20-30 mm/Hg pressure to: - to both legs  daily Off-Loading: Open toe surgical shoe to: - Both feet Additional Orders / Instructions: Follow Nutritious Diet Non Wound Condition: Apply the following to affected area as directed: - continue to apply foam donut or cushion to healed right plantar foot WOUND #3: - Foot Wound Laterality: Plantar, Left, Lateral Cleanser: Soap and Water 3 x Per Week/30 Days Discharge Instructions: May shower and wash wound with dial antibacterial soap and water prior to dressing change. Peri-Wound Care: Skin Prep (Generic) 3 x Per Week/30 Days Discharge Instructions: Use skin prep as directed Prim Dressing: Promogran Prisma Matrix, 4.34 (sq in) (silver collagen) (Generic) 3 x Per Week/30 Days ary Discharge Instructions: Moisten collagen with saline or hydrogel Prim Dressing: VAC 3 x Per Week/30 Days ary Secondary Dressing: Zetuvit Plus 4x8 in (Generic) 3 x Per Week/30 Days Discharge Instructions: Apply over primary dressing as directed. Secured With: Elastic Bandage 4 inch (ACE bandage) (Generic) 3 x Per Week/30 Days Discharge Instructions: Secure with ACE bandage as directed. Secured With: 32M Medipore SPublic affairs consultantSurgical T 2x10 (in/yd) (Generic) 3 x Per Week/30 Days ape Discharge Instructions: Secure with tape as directed. Com pression Wrap: Kerlix Roll 4.5x3.1 (in/yd) (Generic) 3 x Per Week/30 Days Discharge Instructions: Apply Kerlix and Coban compression as directed. 04/06/2022: The wound dimensions are about the same, except that the depth is beginning to fill in. He has had more drainage this week, but there is no obvious concern for infection. No debridement was necessary today. We will continue using Prisma silver collagen, but in light of the increased drainage and better moisture balance in the wound, we will use saline rather than hydrogel to hydrate the collagen. Continue wound VAC. Follow-up in 1 week. Electronic Signature(s) Signed: 04/06/2022 9:39:01 AM By: CFredirick MaudlinMD FACS Entered  By: CFredirick Maudlinon 04/06/2022 09:39:01 -------------------------------------------------------------------------------- HxROS Details Patient Name: Date of Service: ALewie [name] [name] 04/06/2022 8:45 A M Medical Record Number: [id number] Account Number: [id number] of Birth/Sex: Treating RN: [birth year](37y.o. M) Primary Care Provider: PSeward CarolOther Clinician: Referring Provider: Treating Provider/Extender: COsborn Cohoin Treatment: 117Information Obtained From Patient Eyes Medical History: Negative for: Cataracts; Glaucoma; Optic Neuritis Ear/Nose/Mouth/Throat Medical History: Negative for: Chronic sinus problems/congestion; Middle ear problems Hematologic/Lymphatic Medical History: Negative for: Anemia; Hemophilia; Human Immunodeficiency Virus; Lymphedema; Sickle Cell Disease Selman, CMali([id number] Page 19 of 20 Respiratory Medical History: Negative for: Aspiration; Asthma; Chronic Obstructive Pulmonary Disease (COPD); Pneumothorax; Sleep Apnea; Tuberculosis Cardiovascular Medical History: Negative for: Angina; Arrhythmia; Congestive Heart Failure; Coronary Artery Disease; Deep Vein Thrombosis; Hypertension; Hypotension; Myocardial Infarction; Peripheral Arterial Disease; Peripheral Venous Disease; Phlebitis; Vasculitis Gastrointestinal  Medical History: Negative for: Cirrhosis ; Colitis; Crohns; Hepatitis A; Hepatitis B; Hepatitis C Endocrine Medical History: Positive for: Type II Diabetes Negative for: Type I Diabetes Time with diabetes: 8 Treated with: Insulin Blood sugar tested every day: No Immunological Medical History: Negative for: Lupus Erythematosus; Raynauds; Scleroderma Integumentary (Skin) Medical History: Negative for: History of Burn Musculoskeletal Medical History: Negative for: Gout; Rheumatoid Arthritis; Osteoarthritis; Osteomyelitis Neurologic Medical  History: Negative for: Dementia; Neuropathy; Quadriplegia; Paraplegia; Seizure Disorder Oncologic Medical History: Negative for: Received Chemotherapy; Received Radiation Psychiatric Medical History: Negative for: Anorexia/bulimia; Confinement Anxiety Immunizations Pneumococcal Vaccine: Received Pneumococcal Vaccination: No Implantable Devices None Hospitalization / Surgery History Type of Hospitalization/Surgery 11/1-11/06/2018- sepsis foot infection 11/4-11/5 02 sats low respiratory distress Family and Social History Cancer: No; Diabetes: No; Hereditary Spherocytosis: No; Hypertension: No; Kidney Disease: No; Lung Disease: No; Seizures: No; Stroke: No; Thyroid Problems: No; Tuberculosis: No; Never smoker; Marital Status - Single; Alcohol Use: Rarely; Drug Use: No History; Caffeine Use: Never; Financial Concerns: No; Food, Clothing or Shelter Needs: No; Support System Lacking: No; Transportation Concerns: No Electronic Signature(s) Signed: 04/06/2022 12:38:13 PM By: [name] MD FACS [place], [name] [phone] By: [name] MD FACS [phone].pdf Page 20 of 20 Signed: 04/06/2022 12:38:13 Entered By: [name] on 04/06/2022 09:37:39 -------------------------------------------------------------------------------- SuperBill Details Patient Name: Date of Service: [name] D 04/06/2022 Medical Record Number: [id number] Patient Account Number: [id number] Date of Birth/Sex: Treating RN: 02/03/1986 (37 y.o. M) Primary Care Provider: [name] Other Clinician: Referring Provider: Treating Provider/Extender: [name] in Treatment: 127 Diagnosis Coding ICD-10 Codes Code Description [phone] Non-pressure chronic ulcer of other part of left foot with other specified severity E11.621 Type 2 diabetes mellitus with foot ulcer Facility Procedures : CPT4 Code: [id number] Description: 88916 - WOUND VAC-50 SQ CM  OR LESS Modifier: Quantity: 1 Physician Procedures : CPT4 Code Description Modifier [id number] - WC PHYS LEVEL 4 - EST PT ICD-10 Diagnosis Description L97.528 Non-pressure chronic ulcer of other part of left foot with other specified severity E11.621 Type 2 diabetes mellitus with foot ulcer Quantity: 1 Electronic Signature(s) Signed: 04/06/2022 9:39:14 AM By: [name] MD FACS Entered By: [name] on 04/06/2022 09:39:13

## 2022-04-07 NOTE — Progress Notes (Signed)
[name], Max ([id number]) [id number] Page 1 of 8 Visit Report for 04/06/2022 Arrival Information Details Patient Name: Date of Service: [name] D 04/06/2022 8:45 A M Medical Record Number: [id number] Patient Account Number: [id number] Date of Birth/Sex: Treating RN: 12/02/1985 (37 y.o. [name] Primary Care Kendric Sindelar: [name] Other Clinician: Referring Jarquis Walker: Treating Lysander Calixte/Extender: [name] in Treatment: 48 Visit Information History Since Last Visit Added or deleted any medications: No Patient Arrived: Ambulatory Any new allergies or adverse reactions: No Arrival Time: 08:36 Had a fall or experienced change in No Accompanied By: wife activities of daily living that may affect Transfer Assistance: None risk of falls: Patient Requires Transmission-Based Precautions: No Signs or symptoms of abuse/neglect since last visito No Patient Has Alerts: No Hospitalized since last visit: No Implantable device outside of the clinic excluding No cellular tissue based products placed in the center since last visit: Has Dressing in Place as Prescribed: Yes Pain Present Now: No Electronic Signature(s) Signed: 04/07/2022 4:07:58 PM By: Blanche East RN Entered By: Blanche East on 04/06/2022 08:37:18 -------------------------------------------------------------------------------- Encounter Discharge Information Details Patient Name: Date of Service: [name], [name] 04/06/2022 8:45 A M Medical Record Number: [id number] Patient Account Number: [id number] Date of Birth/Sex: Treating RN: Feb 26, 1986 (37 y.o. [name] Primary Care Shatona [name]: [name] Other Clinician: Referring Ambur Province: Treating Marquest Gunkel/Extender: [name] in Treatment: 127 Encounter Discharge Information Items Discharge Condition: Stable Ambulatory Status: Ambulatory Discharge Destination:  Home Transportation: Private Auto Accompanied By: wife Schedule Follow-up Appointment: Yes Clinical Summary of Care: Electronic Signature(s) Signed: 04/07/2022 4:07:58 PM By: Blanche East RN Entered By: Blanche East on 04/06/2022 09:41:47 Max, Max ([id number]) [id number] Page 2 of 8 -------------------------------------------------------------------------------- Lower Extremity Assessment Details Patient Name: Date of Service: [name] D 04/06/2022 8:45 A M Medical Record Number: [id number] Patient Account Number: [id number] Date of Birth/Sex: Treating RN: 1985-08-30 (37 y.o. [name] Primary Care Tkai Serfass: [name] Other Clinician: Referring Nitza Schmid: Treating Arlin Sass/Extender: [name] in Treatment: 127 Edema Assessment Assessed: [name]: No] [Right: No] Edema: [Left: Ye] [Right: s] Calf Left: Right: Point of Measurement: 48 cm From Medial Instep 48 cm Ankle Left: Right: Point of Measurement: 11 cm From Medial Instep 30.2 cm Vascular Assessment Pulses: Dorsalis Pedis Palpable: [Left:Yes] Electronic Signature(s) Signed: 04/07/2022 4:07:58 PM By: Blanche East RN Entered By: Blanche East on 04/06/2022 08:44:29 -------------------------------------------------------------------------------- Multi Wound Chart Details Patient Name: Date of Service: [name], [name] 04/06/2022 8:45 A M Medical Record Number: [id number] Patient Account Number: [id number] Date of Birth/Sex: Treating RN: [birth year] (37 y.o. M) Primary Care Dalin Caldera: [name] Other Clinician: Referring Roey Coopman: Treating Tenee Wish/Extender: [name] in Treatment: 127 Vital Signs Height(in): 77 Capillary Blood Glucose(mg/dl): 180 Weight(lbs): 280 Pulse(bpm): 92 Body Mass Index(BMI): 33.2 Blood Pressure(mmHg): 164/99 Temperature(F): 98.6 Respiratory Rate(breaths/min): 16 [3:Photos:]  [N/A:N/A] Left, Lateral, Plantar Foot N/A N/A Wound Location: Trauma N/A N/A Wounding Event: Diabetic Wound/Ulcer of the Lower N/A N/A Primary Etiology: Extremity Type II Diabetes N/A N/A Comorbid History: 10/02/2019 N/A N/A Date Acquired: 127 N/A N/A Weeks of Treatment: Open N/A N/A Wound Status: No N/A N/A Wound Recurrence: 3x3.2x1.2 N/A N/A Measurements L x W x D (cm) 7.54 N/A N/A A (cm) : rea 9.048 N/A N/A Volume (cm) : -357.20% N/A N/A % Reduction in A rea: -5383.60% N/A N/A % Reduction in Volume: 12 Position 1 (o'clock): 1.2 Maximum Distance 1 (cm): Yes N/A N/A Tunneling: Grade  2 N/A N/A Classification: Medium N/A N/A Exudate A mount: Serosanguineous N/A N/A Exudate Type: red, brown N/A N/A Exudate Color: Distinct, outline attached N/A N/A Wound Margin: Large (67-100%) N/A N/A Granulation A mount: Red N/A N/A Granulation Quality: Small (1-33%) N/A N/A Necrotic A mount: Fat Layer (Subcutaneous Tissue): Yes N/A N/A Exposed Structures: Fascia: No Tendon: No Muscle: No Joint: No Bone: No Small (1-33%) N/A N/A Epithelialization: Callus: Yes N/A N/A Periwound Skin Texture: Maceration: Yes N/A N/A Periwound Skin Moisture: No Abnormalities Noted N/A N/A Periwound Skin Color: No Abnormality N/A N/A Temperature: Negative Pressure Wound Therapy N/A N/A Procedures Performed: Maintenance (NPWT) Treatment Notes Electronic Signature(s) Signed: 04/06/2022 9:36:01 AM By: [name] MD FACS Entered By: [name] on 04/06/2022 09:36:01 -------------------------------------------------------------------------------- Multi-Disciplinary Care Plan Details Patient Name: Date of Service: [name], [name] 04/06/2022 8:45 A M Medical Record Number: [id number] Patient Account Number: [id number] Date of Birth/Sex: Treating RN: [birth year] (37 y.o. [name] Primary Care Akiko Schexnider: [name] Other Clinician: Referring Aarav Burgett: Treating  Anaira Seay/Extender: [name] in Treatment: Alpha reviewed with physician Active Inactive Nutrition Nursing Diagnoses: Imbalanced nutrition Potential for alteratiion in Nutrition/Potential for imbalanced nutrition Goals: Patient/caregiver agrees to and verbalizes understanding of need to use nutritional supplements and/or vitamins as prescribed Date Initiated: 10/24/2019 Date Inactivated: 04/06/2020 Target Resolution Date: 04/03/2020 Goal Status: Met Patient/caregiver will maintain therapeutic glucose control [place], Max ([id number]) [id number] Page 4 of 8 Date Initiated: 10/24/2019 Target Resolution Date: 04/20/2022 Goal Status: Active Interventions: Assess HgA1c results as ordered upon admission and as needed Assess patient nutrition upon admission and as needed per policy Provide education on elevated blood sugars and impact on wound healing Provide education on nutrition Treatment Activities: Education provided on Nutrition : 02/02/2022 Notes: 11/17/20: Glucose control ongoing issue, target date extended. 01/26/21: Glucose management continues. Wound/Skin Impairment Nursing Diagnoses: Impaired tissue integrity Knowledge deficit related to ulceration/compromised skin integrity Goals: Patient/caregiver will verbalize understanding of skin care regimen Date Initiated: 10/24/2019 Target Resolution Date: 04/20/2022 Goal Status: Active Ulcer/skin breakdown will have a volume reduction of 30% by week 4 Date Initiated: 10/24/2019 Date Inactivated: 01/16/2020 Target Resolution Date: 01/10/2020 Unmet Reason: no change in Goal Status: Unmet measurements. Interventions: Assess patient/caregiver ability to obtain necessary supplies Assess patient/caregiver ability to perform ulcer/skin care regimen upon admission and as needed Assess ulceration(s) every visit Provide education on ulcer and skin  care Notes: 11/17/20: Wound care regimen continues Electronic Signature(s) Signed: 04/07/2022 4:07:58 PM By: Blanche East RN Entered By: Blanche East on 04/06/2022 08:53:13 -------------------------------------------------------------------------------- Negative Pressure Wound Therapy Maintenance (NPWT) Details Patient Name: Date of Service: [name] D 04/06/2022 8:45 A M Medical Record Number: [id number] Patient Account Number: [id number] Date of Birth/Sex: Treating RN: [birth year] (37 y.o. [name] Primary Care Charizma Gardiner: [name] Other Clinician: Referring Josey Dettmann: Treating Coran Dipaola/Extender: [name] in Treatment: 127 NPWT Maintenance Performed for: Wound #3 Left, Lateral, Plantar Foot Performed By: Blanche East, RN Type: VAC System Coverage Size (sq cm): 9.6 Pressure Type: Constant Pressure Setting: 125 mmHG Drain Type: None Primary Contact: Other : prisma Sponge/Dressing Type: Foam, Black Date Initiated: 02/11/2022 Dressing Removed: No Quantity of Sponges/Gauze Removed: 1 Canister Changed: Yes Canister Exudate Volume: 30 Dressing Reapplied: Yes Quantity of Sponges/Gauze Inserted: 1 Respones T Treatment: o tolerated well Days On NPWT : [place], Max ([id number]) [id number] Page 5 of 8 Post Procedure Diagnosis Same as Pre-procedure Electronic Signature(s) Signed: 04/07/2022 4:07:58 PM By: Blanche East  RN Entered By: Blanche East on 04/06/2022 09:16:08 -------------------------------------------------------------------------------- Pain Assessment Details Patient Name: Date of Service: [name] D 04/06/2022 8:45 A M Medical Record Number: [id number] Patient Account Number: [id number] Date of Birth/Sex: Treating RN: 17-Dec-1985 (37 y.o. [name] Primary Care Zoria Rawlinson: [name] Other Clinician: Referring Renton Berkley: Treating Aaryn Parrilla/Extender: [name] in Treatment: 127 Active Problems Location of Pain Severity and Description of Pain Patient Has Paino No Site Locations Rate the pain. Current Pain Level: 0 Pain Management and Medication Current Pain Management: Electronic Signature(s) Signed: 04/07/2022 4:07:58 PM By: Blanche East RN Entered By: Blanche East on 04/06/2022 08:44:17 -------------------------------------------------------------------------------- Patient/Caregiver Education Details Patient Name: Date of Service: [name], [name] 1/3/2024andnbsp8:45 [place] Record Number: [id number] Patient Account Number: [id number] Date of Birth/Gender: Treating RN: 10/09/1985 (37 y.o. [name] Primary Care Physician: [name] Other Clinician: Referring Physician: Treating Physician/Extender: [name] in Treatment: [address], Max ([id number]) [id number] Page 6 of 8 Education Assessment Education Provided To: Patient Education Topics Provided Wound/Skin Impairment: Methods: Explain/Verbal Responses: Reinforcements needed, State content correctly Electronic Signature(s) Signed: 04/07/2022 4:07:58 PM By: Blanche East RN Entered By: Blanche East on 04/06/2022 08:54:40 -------------------------------------------------------------------------------- Wound Assessment Details Patient Name: Date of Service: [name], [name] 04/06/2022 8:45 A M Medical Record Number: [id number] Patient Account Number: [id number] Date of Birth/Sex: Treating RN: [birth year] (37 y.o. [name] Primary Care Newel Oien: [name] Other Clinician: Referring Brandee Markin: Treating Briya Lookabaugh/Extender: [name] in Treatment: 127 Wound Status Wound Number: 3 Primary Etiology: Diabetic Wound/Ulcer of the Lower Extremity Wound Location: Left, Lateral, Plantar Foot Wound Status: Open Wounding Event: Trauma Comorbid History: Type II  Diabetes Date Acquired: 10/02/2019 Weeks Of Treatment: 127 Clustered Wound: No Photos Wound Measurements Length: (cm) 3 Width: (cm) 3.2 Depth: (cm) 1.2 Area: (cm) 7.54 Volume: (cm) 9.048 % Reduction in Area: -357.2% % Reduction in Volume: -5383.6% Epithelialization: Small (1-33%) Tunneling: Yes Position (o'clock): 12 Maximum Distance: (cm) 1.2 Undermining: No Wound Description Classification: Grade 2 Wound Margin: Distinct, outline attached Exudate Amount: Medium Exudate Type: Serosanguineous Exudate Color: red, brown Foul Odor After Cleansing: No Slough/Fibrino No Wound Bed Schlesinger, Max ([id number]) [id number] Page 7 of 8 Granulation Amount: Large (67-100%) Exposed Structure Granulation Quality: Red Fascia Exposed: No Necrotic Amount: Small (1-33%) Fat Layer (Subcutaneous Tissue) Exposed: Yes Necrotic Quality: Adherent Slough Tendon Exposed: No Muscle Exposed: No Joint Exposed: No Bone Exposed: No Periwound Skin Texture Texture Color No Abnormalities Noted: No No Abnormalities Noted: Yes Callus: Yes Temperature / Pain Temperature: No Abnormality Moisture No Abnormalities Noted: No Maceration: Yes Treatment Notes Wound #3 (Foot) Wound Laterality: Plantar, Left, Lateral Cleanser Soap and Water Discharge Instruction: May shower and wash wound with dial antibacterial soap and water prior to dressing change. Peri-Wound Care Skin Prep Discharge Instruction: Use skin prep as directed Topical Primary Dressing Promogran Prisma Matrix, 4.34 (sq in) (silver collagen) Discharge Instruction: Moisten collagen with saline or hydrogel VAC Secondary Dressing Zetuvit Plus 4x8 in Discharge Instruction: Apply over primary dressing as directed. Secured With Elastic Bandage 4 inch (ACE bandage) Discharge Instruction: Secure with ACE bandage as directed. 52M Medipore Soft Cloth Surgical T 2x10 (in/yd) ape Discharge Instruction: Secure with  tape as directed. Compression Wrap Kerlix Roll 4.5x3.1 (in/yd) Discharge Instruction: Apply Kerlix and Coban compression as directed. Compression Stockings Add-Ons Electronic Signature(s) Signed: 04/07/2022 4:07:58 PM By: Blanche East RN Entered By: Blanche East on 04/06/2022 08:51:02 -------------------------------------------------------------------------------- Vitals Details  Patient Name: Date of Service: [name] D 04/06/2022 8:45 A M Medical Record Number: [id number] Patient Account Number: [id number] Date of Birth/Sex: Treating RN: 1985-10-12 (37 y.o. [name] Primary Care Lela Murfin: [name] Other Clinician: Referring Billy Rocco: Treating Aaniya Sterba/Extender: [name] in Treatment: [id number] Max ([id number]) [id number] Page 8 of 8 Vital Signs Time Taken: 08:37 Temperature (F): 98.6 Height (in): 77 Pulse (bpm): 92 Weight (lbs): 280 Respiratory Rate (breaths/min): 16 Body Mass Index (BMI): 33.2 Blood Pressure (mmHg): 164/99 Capillary Blood Glucose (mg/dl): 180 Reference Range: 80 - 120 mg / dl Electronic Signature(s) Signed: 04/07/2022 4:07:58 PM By: Blanche East RN Entered By: Blanche East on 04/06/2022 08:39:54

## 2022-04-13 ENCOUNTER — Encounter (HOSPITAL_BASED_OUTPATIENT_CLINIC_OR_DEPARTMENT_OTHER): Payer: BC Managed Care – PPO | Admitting: General Surgery

## 2022-04-13 DIAGNOSIS — E11621 Type 2 diabetes mellitus with foot ulcer: Secondary | ICD-10-CM | POA: Diagnosis not present

## 2022-04-16 NOTE — Progress Notes (Signed)
[place], [name] ([id number]) [id number] Page 1 of 8 Visit Report for 04/13/2022 Arrival Information Details Patient Name: Date of Service: [name] D 04/13/2022 8:45 A M Medical Record Number: [id number] Patient Account Number: [id number] Date of Birth/Sex: Treating RN: December 12, 1985 (36 y.o. [name] Primary Care Caulder Wehner: [name] Other Clinician: Referring Moon Budde: Treating Loyd Marhefka/Extender: [name] in Treatment: 128 Visit Information History Since Last Visit Added or deleted any medications: No Patient Arrived: Ambulatory Any new allergies or adverse reactions: No Arrival Time: 08:39 Had a fall or experienced change in No Accompanied By: self activities of daily living that may affect Transfer Assistance: None risk of falls: Patient Requires Transmission-Based Precautions: No Signs or symptoms of abuse/neglect since last visito No Patient Has Alerts: No Hospitalized since last visit: No Implantable device outside of the clinic excluding No cellular tissue based products placed in the center since last visit: Has Dressing in Place as Prescribed: Yes Pain Present Now: No Electronic Signature(s) Signed: 04/15/2022 4:39:48 PM By: Tommie Ard RN Entered By: Tommie Ard on 04/13/2022 08:39:36 -------------------------------------------------------------------------------- Encounter Discharge Information Details Patient Name: Date of Service: [name], [name] 04/13/2022 8:45 A M Medical Record Number: [id number] Patient Account Number: [id number] Date of Birth/Sex: Treating RN: 02-03-1986 (36 y.o. [name] Primary Care Deboraha Goar: [name] Other Clinician: Referring Antoino [name]: Treating Winter Jocelyn/Extender: [name] in Treatment: 128 Encounter Discharge Information Items Post Procedure Vitals Discharge Condition: Stable Temperature (F): 97.7 Ambulatory Status:  Ambulatory Pulse (bpm): 83 Discharge Destination: Home Respiratory Rate (breaths/min): 16 Transportation: Other Blood Pressure (mmHg): 140/89 Accompanied By: self Schedule Follow-up Appointment: Yes Clinical Summary of Care: Electronic Signature(s) Signed: 04/15/2022 4:39:48 PM By: Tommie Ard RN Entered By: Tommie Ard on 04/13/2022 09:02:10 [place], [name] ([id number]) [id number] Page 2 of 8 -------------------------------------------------------------------------------- Lower Extremity Assessment Details Patient Name: Date of Service: [name] D 04/13/2022 8:45 A M Medical Record Number: [id number] Patient Account Number: [id number] Date of Birth/Sex: Treating RN: May 16, 1985 (36 y.o. [name] Primary Care Kaydence Menard: [name] Other Clinician: Referring Ronin Crager: Treating Mylez Venable/Extender: [name] in Treatment: 128 Edema Assessment Assessed: [name]: No] Franne Forts: No] Edema: [Left: Ye] [Right: s] Calf Left: Right: Point of Measurement: 48 cm From Medial Instep 48 cm Ankle Left: Right: Point of Measurement: 11 cm From Medial Instep 30 cm Vascular Assessment Pulses: Dorsalis Pedis Palpable: [Left:Yes] Electronic Signature(s) Signed: 04/15/2022 4:39:48 PM By: Tommie Ard RN Entered By: Tommie Ard on 04/13/2022 08:47:08 -------------------------------------------------------------------------------- Multi Wound Chart Details Patient Name: Date of Service: [name], [name] 04/13/2022 8:45 A M Medical Record Number: [id number] Patient Account Number: [id number] Date of Birth/Sex: Treating RN: April 29, 1985 (36 y.o. M) Primary Care Alwyn Cordner: [name] Other Clinician: Referring Georga Stys: Treating Kenneth Lax/Extender: [name] in Treatment: 128 Vital Signs Height(in): 77 Capillary Blood Glucose(mg/dl): 85 Weight(lbs): 947 Pulse(bpm): 83 Body Mass Index(BMI):  33.2 Blood Pressure(mmHg): 140/89 Temperature(F): 97.7 Respiratory Rate(breaths/min): 16 [3:Photos:] [N/A:N/A] Left, Lateral, Plantar Foot N/A N/A Wound Location: Trauma N/A N/A Wounding Event: Diabetic Wound/Ulcer of the Lower N/A N/A Primary Etiology: Extremity Type II Diabetes N/A N/A Comorbid History: 10/02/2019 N/A N/A Date Acquired: 128 N/A N/A Weeks of Treatment: Open N/A N/A Wound Status: No N/A N/A Wound Recurrence: 3.6x3.9x0.9 N/A N/A Measurements L x W x D (cm) 11.027 N/A N/A A (cm) : rea 9.924 N/A N/A Volume (cm) : -568.70% N/A N/A % Reduction in A rea: -5914.50% N/A N/A % Reduction  in Volume: 12 Position 1 (o'clock): 0.6 Maximum Distance 1 (cm): 12 Starting Position 1 (o'clock): 5 Ending Position 1 (o'clock): 0.4 Maximum Distance 1 (cm): Yes N/A N/A Tunneling: Yes N/A N/A Undermining: Grade 2 N/A N/A Classification: Medium N/A N/A Exudate A mount: Serosanguineous N/A N/A Exudate Type: red, brown N/A N/A Exudate Color: Distinct, outline attached N/A N/A Wound Margin: Large (67-100%) N/A N/A Granulation A mount: Red N/A N/A Granulation Quality: Small (1-33%) N/A N/A Necrotic A mount: Fat Layer (Subcutaneous Tissue): Yes N/A N/A Exposed Structures: Fascia: No Tendon: No Muscle: No Joint: No Bone: No Small (1-33%) N/A N/A Epithelialization: Debridement - Selective/Open Wound N/A N/A Debridement: Pre-procedure Verification/Time Out 08:57 N/A N/A Taken: Callus N/A N/A Tissue Debrided: Skin/Epidermis N/A N/A Level: 1.2 N/A N/A Debridement A (sq cm): rea Curette N/A N/A Instrument: None N/A N/A Bleeding: 0 N/A N/A Procedural Pain: 0 N/A N/A Post Procedural Pain: Procedure was tolerated well N/A N/A Debridement Treatment Response: 3.6x3.9x0.9 N/A N/A Post Debridement Measurements L x W x D (cm) 9.924 N/A N/A Post Debridement Volume: (cm) Callus: Yes N/A N/A Periwound Skin Texture: Maceration: Yes N/A  N/A Periwound Skin Moisture: No Abnormalities Noted N/A N/A Periwound Skin Color: No Abnormality N/A N/A Temperature: Debridement N/A N/A Procedures Performed: Negative Pressure Wound Therapy Maintenance (NPWT) Treatment Notes Wound #3 (Foot) Wound Laterality: Plantar, Left, Lateral Cleanser Soap and Water Discharge Instruction: May shower and wash wound with dial antibacterial soap and water prior to dressing change. Peri-Wound Care Skin Prep Discharge Instruction: Use skin prep as directed Topical Primary Dressing Promogran Prisma Matrix, 4.34 (sq in) (silver collagen) Discharge Instruction: Moisten collagen with saline or hydrogel VAC Secondary Dressing Zetuvit Plus 4x8 in Discharge Instruction: Apply over primary dressing as directed. Secured With [place], [name] ([id number]) [id number] Page 4 of 8 Elastic Bandage 4 inch (ACE bandage) Discharge Instruction: Secure with ACE bandage as directed. 43M Medipore Soft Cloth Surgical T 2x10 (in/yd) ape Discharge Instruction: Secure with tape as directed. Compression Wrap Kerlix Roll 4.5x3.1 (in/yd) Discharge Instruction: Apply Kerlix and Coban compression as directed. Compression Stockings Add-Ons Electronic Signature(s) Signed: 04/13/2022 9:18:55 AM By: [name] MD FACS Entered By: [name] on 04/13/2022 09:18:55 -------------------------------------------------------------------------------- Multi-Disciplinary Care Plan Details Patient Name: Date of Service: [name], [name] 04/13/2022 8:45 A M Medical Record Number: [id number] Patient Account Number: [id number] Date of Birth/Sex: Treating RN: Oct 30, 1985 (37 y.o. [name] Primary Care Silas Sedam: [name] Other Clinician: Referring Shahla Betsill: Treating Rebekah Zackery/Extender: [name] in Treatment: [place] reviewed with physician Active Inactive Nutrition Nursing  Diagnoses: Imbalanced nutrition Potential for alteratiion in Nutrition/Potential for imbalanced nutrition Goals: Patient/caregiver agrees to and verbalizes understanding of need to use nutritional supplements and/or vitamins as prescribed Date Initiated: 10/24/2019 Date Inactivated: 04/06/2020 Target Resolution Date: 04/03/2020 Goal Status: Met Patient/caregiver will maintain therapeutic glucose control Date Initiated: 10/24/2019 Target Resolution Date: 06/01/2022 Goal Status: Active Interventions: Assess HgA1c results as ordered upon admission and as needed Assess patient nutrition upon admission and as needed per policy Provide education on elevated blood sugars and impact on wound healing Provide education on nutrition Treatment Activities: Education provided on Nutrition : 12/07/2021 Notes: 11/17/20: Glucose control ongoing issue, target date extended. 01/26/21: Glucose management continues. Wound/Skin Impairment Nursing Diagnoses: Impaired tissue integrity Knowledge deficit related to ulceration/compromised skin integrity Goals: Patient/caregiver will verbalize understanding of skin care regimen Date Initiated: 10/24/2019 Target Resolution Date: 06/01/2022 [place], [name] ([id number]) [id number] Page 5 of 8 Goal Status: Active Ulcer/skin breakdown  will have a volume reduction of 30% by week 4 Date Initiated: 10/24/2019 Date Inactivated: 01/16/2020 Target Resolution Date: 01/10/2020 Unmet Reason: no change in Goal Status: Unmet measurements. Interventions: Assess patient/caregiver ability to obtain necessary supplies Assess patient/caregiver ability to perform ulcer/skin care regimen upon admission and as needed Assess ulceration(s) every visit Provide education on ulcer and skin care Notes: 11/17/20: Wound care regimen continues Electronic Signature(s) Signed: 04/13/2022 8:55:10 AM By: Tommie Ard RN Entered By: Tommie Ard on 04/13/2022  08:55:10 -------------------------------------------------------------------------------- Negative Pressure Wound Therapy Maintenance (NPWT) Details Patient Name: Date of Service: [name] D 04/13/2022 8:45 A M Medical Record Number: [id number] Patient Account Number: [id number] Date of Birth/Sex: Treating RN: 1985-05-19 (36 y.o. [name] Primary Care Meg Niemeier: [name] Other Clinician: Referring Harvy Riera: Treating [place] Dube/Extender: [name] in Treatment: 128 NPWT Maintenance Performed for: Wound #3 Left, Lateral, Plantar Foot Performed By: Tommie Ard, RN Type: VAC System Coverage Size (sq cm): 14.04 Pressure Type: Constant Pressure Setting: 125 mmHG Drain Type: None Primary Contact: Other : prisma Sponge/Dressing Type: Foam, Black Date Initiated: 02/11/2022 Dressing Removed: No Quantity of Sponges/Gauze Removed: 1 Canister Changed: No Canister Exudate Volume: 10 Dressing Reapplied: No Quantity of Sponges/Gauze Inserted: 1 Respones T Treatment: o tolerated well Days On NPWT : 62 Post Procedure Diagnosis Same as Pre-procedure Electronic Signature(s) Signed: 04/15/2022 4:39:48 PM By: Tommie Ard RN Entered By: Tommie Ard on 04/13/2022 09:01:12 -------------------------------------------------------------------------------- Pain Assessment Details Patient Name: Date of Service: [name] D 04/13/2022 8:45 A M Medical Record Number: [id number] Patient Account Number: [id number] Date of Birth/Sex: Treating RN: 1986-01-01 (36 y.o. [name] Primary Care Redell Bhandari: [name] Other Clinician: [place], [name] ([id number]) [id number] Page 6 of 8 Referring Tyshaun Vinzant: Treating Yidel Teuscher/Extender: [name] in Treatment: 128 Active Problems Location of Pain Severity and Description of Pain Patient Has Paino No Site Locations Rate the pain. Current Pain  Level: 0 Pain Management and Medication Current Pain Management: Electronic Signature(s) Signed: 04/15/2022 4:39:48 PM By: Tommie Ard RN Entered By: Tommie Ard on 04/13/2022 08:41:17 -------------------------------------------------------------------------------- Patient/Caregiver Education Details Patient Name: Date of Service: [name], [name] 1/10/2024andnbsp8:45 A M Medical Record Number: [id number] Patient Account Number: [id number] Date of Birth/Gender: Treating RN: 27-Aug-1985 (36 y.o. [name] Primary Care Physician: [name] Other Clinician: Referring Physician: Treating Physician/Extender: [name] in Treatment: 128 Education Assessment Education Provided To: Patient Education Topics Provided Elevated Blood Sugar/ Impact on Healing: Methods: Explain/Verbal Responses: Reinforcements needed, State content correctly Wound/Skin Impairment: Methods: Explain/Verbal Responses: Reinforcements needed, State content correctly Electronic Signature(s) Signed: 04/15/2022 4:39:48 PM By: Tommie Ard RN Entered By: Tommie Ard on 04/13/2022 08:55:34 [place], [name] ([id number]) [id number] Page 7 of 8 -------------------------------------------------------------------------------- Wound Assessment Details Patient Name: Date of Service: [name] D 04/13/2022 8:45 A M Medical Record Number: [id number] Patient Account Number: [id number] Date of Birth/Sex: Treating RN: June 24, 1985 (36 y.o. [name] Primary Care Saifan Rayford: [name] Other Clinician: Referring Merly Hinkson: Treating James Senn/Extender: [name] in Treatment: 128 Wound Status Wound Number: 3 Primary Etiology: Diabetic Wound/Ulcer of the Lower Extremity Wound Location: Left, Lateral, Plantar Foot Wound Status: Open Wounding Event: Trauma Comorbid History: Type II Diabetes Date Acquired:  10/02/2019 Weeks Of Treatment: 128 Clustered Wound: No Photos Wound Measurements Length: (cm) 3.6 Width: (cm) 3.9 Depth: (cm) 0.9 Area: (cm) 11.027 Volume: (cm) 9.924 % Reduction in Area: -568.7% % Reduction in Volume: -5914.5% Epithelialization: Small (1-33%)  Tunneling: Yes Position (o'clock): 12 Maximum Distance: (cm) 0.6 Undermining: Yes Starting Position (o'clock): 12 Ending Position (o'clock): 5 Maximum Distance: (cm) 0.4 Wound Description Classification: Grade 2 Wound Margin: Distinct, outline attached Exudate Amount: Medium Exudate Type: Serosanguineous Exudate Color: red, brown Foul Odor After Cleansing: No Slough/Fibrino No Wound Bed Granulation Amount: Large (67-100%) Exposed Structure Granulation Quality: Red Fascia Exposed: No Necrotic Amount: Small (1-33%) Fat Layer (Subcutaneous Tissue) Exposed: Yes Necrotic Quality: Adherent Slough Tendon Exposed: No Muscle Exposed: No Joint Exposed: No Bone Exposed: No Periwound Skin Texture Texture Color No Abnormalities Noted: No No Abnormalities Noted: Yes Callus: Yes Temperature / Pain Temperature: No Abnormality Moisture No Abnormalities Noted: No Maceration: Yes [place], [name] ([id number]) [id number] Page 8 of 8 Treatment Notes Wound #3 (Foot) Wound Laterality: Plantar, Left, Lateral Cleanser Soap and Water Discharge Instruction: May shower and wash wound with dial antibacterial soap and water prior to dressing change. Peri-Wound Care Skin Prep Discharge Instruction: Use skin prep as directed Topical Primary Dressing Promogran Prisma Matrix, 4.34 (sq in) (silver collagen) Discharge Instruction: Moisten collagen with saline or hydrogel VAC Secondary Dressing Zetuvit Plus 4x8 in Discharge Instruction: Apply over primary dressing as directed. Secured With Elastic Bandage 4 inch (ACE bandage) Discharge Instruction: Secure with ACE bandage as directed. 12M Medipore Soft  Cloth Surgical T 2x10 (in/yd) ape Discharge Instruction: Secure with tape as directed. Compression Wrap Kerlix Roll 4.5x3.1 (in/yd) Discharge Instruction: Apply Kerlix and Coban compression as directed. Compression Stockings Add-Ons Electronic Signature(s) Signed: 04/15/2022 4:39:48 PM By: Blanche East RN Entered By: Blanche East on 04/13/2022 08:51:32 -------------------------------------------------------------------------------- Vitals Details Patient Name: Date of Service: [name], [name] 04/13/2022 8:45 A M Medical Record Number: [id number] Patient Account Number: [id number] Date of Birth/Sex: Treating RN: 1985-06-20 (37 y.o. [name] Primary Care Tye Juarez: [name] Other Clinician: Referring Gabrielle Wakeland: Treating Doria Fern/Extender: [name] in Treatment: 128 Vital Signs Time Taken: 08:40 Temperature (F): 97.7 Height (in): 77 Pulse (bpm): 83 Weight (lbs): 280 Respiratory Rate (breaths/min): 16 Body Mass Index (BMI): 33.2 Blood Pressure (mmHg): 140/89 Capillary Blood Glucose (mg/dl): 85 Reference Range: 80 - 120 mg / dl Electronic Signature(s) Signed: 04/15/2022 4:39:48 PM By: Blanche East RN Entered By: Blanche East on 04/13/2022 08:41:05

## 2022-04-16 NOTE — Progress Notes (Signed)
Searls, [name] ([id number]) [id number] Page 1 of 21 Visit Report for 04/13/2022 Chief Complaint Document Details Patient Name: Date of Service: [name] 04/13/2022 8:45 A M Medical Record Number: [id number] Patient Account Number: [id number] Date of Birth/Sex: Treating RN: December 27, 1985 (37 y.o. M) Primary Care Provider: [name] Other Clinician: Referring Provider: Treating Provider/Extender: [name] in Treatment: 128 Information Obtained from: Patient Chief Complaint 01/11/2019; patient is here for review of a rather substantial wound over the left fifth plantar metatarsal head extending into the lateral part of his foot 10/24/2019; patient returns to clinic with wounds on his bilateral feet with underlying osteomyelitis biopsy-proven Electronic Signature(s) Signed: 04/13/2022 9:19:17 AM By: Duanne Guess MD FACS Entered By: Duanne Guess on 04/13/2022 09:19:17 -------------------------------------------------------------------------------- Debridement Details Patient Name: Date of Service: [name], [name] 04/13/2022 8:45 A M Medical Record Number: [id number] Patient Account Number: [id number] Date of Birth/Sex: Treating RN: 07-20-1985 (37 y.o. [name] Primary Care Provider: [name] Other Clinician: Referring Provider: Treating Provider/Extender: [name] in Treatment: 128 Debridement Performed for Assessment: Wound #3 Left,Lateral,Plantar Foot Performed By: Physician Duanne Guess, MD Debridement Type: Debridement Severity of Tissue Pre Debridement: Fat layer exposed Level of Consciousness (Pre-procedure): Awake and Alert Pre-procedure Verification/Time Out Yes - 08:57 Taken: Start Time: 08:58 T Area Debrided (L x W): otal 3 (cm) x 0.4 (cm) = 1.2 (cm) Tissue and other material debrided: Callus, Skin: Epidermis Level: Skin/Epidermis Debridement  Description: Selective/Open Wound Instrument: Curette Bleeding: None Procedural Pain: 0 Post Procedural Pain: 0 Response to Treatment: Procedure was tolerated well Level of Consciousness (Post- Awake and Alert procedure): Post Debridement Measurements of Total Wound Length: (cm) 3.6 Width: (cm) 3.9 Depth: (cm) 0.9 Volume: (cm) 9.924 Character of Wound/Ulcer Post Debridement: Requires Further Debridement Severity of Tissue Post Debridement: Fat layer exposed Post Procedure Diagnosis Max, [name] ([id number]) [id number] Page 2 of 21 Same as Pre-procedure Notes Scribed for Dr. Lady Gary by Tommie Ard, RN Electronic Signature(s) Signed: 04/13/2022 11:49:27 AM By: Duanne Guess MD FACS Signed: 04/15/2022 4:39:48 PM By: Tommie Ard RN Entered By: Tommie Ard on 04/13/2022 09:00:23 -------------------------------------------------------------------------------- HPI Details Patient Name: Date of Service: [name], [name] 04/13/2022 8:45 A M Medical Record Number: [id number] Patient Account Number: [id number] Date of Birth/Sex: Treating RN: October 19, 1985 (37 y.o. 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 He comes in for review of a wound over the plantar fifth metatarsal head extending into the lateral part of the foot. He was followed for this previously by his podiatrist Dr. Darreld Mclean. As the patient tells his story he went to see podiatry first for a swelling he developed on the lateral part of his fifth metatarsal head in May. He states this was "open" by podiatry and the area closed. He was followed up in June and it was again opened callus removed and it closed promptly. There were  plans being made for surgery on the fifth metatarsal head in June however his blood sugar was apparently too high for anesthesia. Apparently the area was debrided and opened again in June and it is never closed since. Looking over the records from podiatry I am really not able to follow this. It was clear when he was first seen it was before 5/14 at  that point he already had a wound. By 5/17 the ulcer was resolved. I do not see anything about a procedure. On 5/28 noted to have pre-ulcerative moderate keratosis. X-ray noted 1/5 contracted toe and tailor's bunion and metatarsal deformity. On a visit date on 09/28/2018 the dorsal part of the left foot it healed and resolved. There was concern about swelling in his lower extremity he was sent to the ER.. As far as I can tell he was seen in the ER on 7/12 with an ulcer on his left foot. A DVT rule out of the left leg was negative. I do not think I have complete records from podiatry but I am not able to verify the procedures this patient states he had. He states after the last procedure the wound has never closed although I am not able to follow this in the records I have from podiatry. He has not had a recent x-ray The patient has been using Neosporin on the wound. He is wearing a Darco shoe. He is still very active up on his foot working and exercising. Past medical history; type 2 diabetes ketosis-prone, leg swelling with a negative DVT study in July. Non-smoker ABI in our clinic was 0.85 on the left 10/16; substantial wound on the plantar left fifth met head extending laterally almost to the dorsal fifth MTP. We have been using silver alginate we gave him a Darco forefoot off loader. An x-ray did not show evidence of osteomyelitis did note soft tissue emphysema which I think was due to gas tracking through an open wound. There is no doubt in my mind he requires an MRI 10/23; MRI not booked until 3 November at the earliest this is largely due to his  glucose sensor in the right arm. We have been using silver alginate. There has been an improvement 10/29; I am still not exactly sure when his MRI is booked for. He says it is the third but it is the 10th in epic. This definitely needs to be done. He is running a low-grade fever today but no other symptoms. No real improvement in the 1 02/26/2019 patient presents today for a follow-up visit here in our clinic he is last been seen in the clinic on October 29. Subsequently we were working on getting MRI to evaluate and see what exactly was going on and where we would need to go from the standpoint of whether or not he had osteomyelitis and again what treatments were going be required. Subsequently the patient ended up being admitted to the hospital on 02/07/2019 and was discharged on 02/14/2019. This is a somewhat interesting admission with a discharge diagnosis of pneumonia due to COVID-19 although he was positive for COVID-19 when tested at the urgent care but negative x2 when he was actually in the hospital. With that being said he did have acute respiratory failure with hypoxia and it was noted he also have a left foot ulceration with osteomyelitis. With that being said he did require oxygen for his pneumonia and I level 4 L. He was placed on antivirals and steroids for the COVID-19. He was also transferred to the [facility] at one point. Nonetheless he did subsequently discharged home and since being home has done much better in that regard. The CT angiogram did not show any pulmonary embolism. With regard to the osteomyelitis the patient was placed on vancomycin and Zosyn while in the hospital but has been changed to Augmentin at discharge. It was also recommended  that he follow- up with wound care and podiatry. Podiatry however wanted him to see [name] according to the patient prior to them doing anything further. His hemoglobin A1c was 9.9 as noted in the hospital. Have an MRI of the left  foot performed while in the hospital on 02/04/2019. This showed evidence of septic arthritis at the fifth MTP joint and osteomyelitis involving the fifth metatarsal head and proximal phalanx. There is an overlying plantar open wound noted an abscess tracking back along the lateral aspect of the fifth metatarsal shaft. There is otherwise diffuse cellulitis and mild fasciitis without findings of polymyositis. The patient did have recently pneumonia secondary to COVID-19 I looked in the chart through epic and it does appear that the patient may need to have an additional x-ray just to ensure everything is cleared and that he has no airspace disease prior to putting him into the chamber. 03/05/2019; patient was readmitted to the clinic last week. He was hospitalized twice for a viral upper respiratory tract infection from 11/1 through 11/4 and then 11/5 through 11/12 ultimately this turned out to be Covid pneumonitis. Although he was discharged on oxygen he is not using it. He says he feels fine. He has no exercise limitation no cough no sputum. His O2 sat in our clinic today was 100% on room air. 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 He received Vanco and Zosyn in the hospital and then was discharged on 2 weeks of Augmentin. I do not see any relevant cultures. He was supposed to follow-up with infectious disease but I do not see that he has an appointment. 12/8; patient saw Dr. Ronnie Derby of infectious disease last week. He felt that he had had adequate antibiotic therapy. He did not go to follow-up with Dr. Logan Bores of [place], [name] ([id number]) [id number] Page 3 of 21 podiatry and I have again talked to him about the pros and cons of this. He does not want to consider a ray amputation of this time. He is aware of the risks of recurrence, migration etc. He started  HBO today and tolerated this well. He can complete the Augmentin that I gave him last week. I have looked over the lab work that Dr. Effie Shy ordered his C-reactive protein was 3.3 and his sedimentation rate was 17. The C-reactive protein is never really been measurably that high in this patient 12/15; not much change in the wound today however he has undermining along the lateral part of the foot again more extensively than last week. He has some rims of epithelialization. We have been using silver alginate. He is undergoing hyperbarics but did not dive today 12/18; in for his obligatory first total contact cast change. Unfortunately there was pus coming from the undermining area around his fifth metatarsal head. This was cultured but will preclude reapplication of a cast. He is seen in conjunction with HBO 12/24; patient had staph lugdunensis in the wound in the undermining area laterally last time. We put him on doxycycline which should have covered this. The wound looks better today. I am going to give him another week of doxycycline before reattempting the total contact cast 12/31; the patient is completing antibiotics. Hemorrhagic debris in the distal part of the wound with some undermining distally. He also had hyper granulation. Extensive debridement with a #5 curette. The infected area that was on the lateral part of the  fifth met head is closed over. I do not think he needs any more antibiotics. Patient was seen prior to HBO. Preparations for a total contact cast were made in the cast will be placed post hyperbarics 04/11/19; once again the patient arrives today without complaint. He had been in a cast all week noted that he had heavy drainage this week. This resulted in large raised areas of macerated tissue around the wound 1/14; wound bed looks better slightly smaller. Hydrofera Blue has been changing himself. He had a heavy drainage last week which caused a lot of maceration around the  wound so I took him out of a total contact cast he says the drainage is actually better this week He is seen today in conjunction with HBO 1/21; returns to clinic. He was up in [place] for a day or 2 attending a funeral. He comes back in with the wound larger and with a large area of exposed bone. He had osteomyelitis and septic arthritis of the fifth left metatarsal head while he was in hospital. He received IV antibiotics in the hospital for a prolonged period of time then 3 weeks of Augmentin. Subsequently I gave him 2 weeks of doxycycline for more superficial wound infection. When I saw this last week the wound was smaller the surface of the wound looks satisfactory. 1/28; patient missed hyperbarics today. Bone biopsy I did last time showed Enterococcus faecalis and Staphylococcus lugdunensis . He has a wide area of exposed bone. We are going to use silver alginate as of today. I had another ethical discussion with the patient. 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 In this situation I think the likelihood of healing this is low. Therefore I have recommended a ray amputation and with the patient's agreement I have referred him to Dr. Victorino Dike. The other issue is that his compliance with hyperbarics has been minimal because of his work schedule and given his underlying decision I am going to stop this today READMISSION 10/24/2019 MRI 09/29/2019 left foot IMPRESSION: 1. Apparent skin ulceration inferior and lateral to the 5th metatarsal base with underlying heterogeneous T2 signal and enhancement in the subcutaneous fat. Small peripherally enhancing fluid collections along the plantar and lateral aspects of the 5th metatarsal base suspicious for abscesses. 2. Interval amputation through the mid 5th metatarsal with nonspecific low-level marrow edema and enhancement. Given the proximity to the adjacent soft tissue inflammatory changes, osteomyelitis cannot  be excluded. 3. The additional bones appear unremarkable. MRI 09/29/2019 right foot IMPRESSION: 1. Soft tissue ulceration lateral to the 5th MTP joint. There is low-level T2 hyperintensity within the 4th and 5th metatarsal heads and adjacent proximal phalanges without abnormal T1 signal or cortical destruction. These findings are nonspecific and could be seen with early marrow edema, hyperemia or early osteomyelitis. No evidence of septic joint. 2. Mild tenosynovitis and synovial enhancement associated with the extensor digitorum tendons at the level of the midfoot. 3. Diffuse low-level muscular T2 hyperintensity and enhancement, most consistent with diabetic myopathy. LEFT FOOT BONE Methicillin resistant staphylococcus aureus Staphylococcus lugdunensis MIC MIC CIPROFLOXACIN >=8 RESISTANT Resistant <=0.5 SENSI... Sensitive CLINDAMYCIN <=0.25 SENS... Sensitive >=8 RESISTANT Resistant ERYTHROMYCIN >=8 RESISTANT Resistant >=8 RESISTANT Resistant GENTAMICIN <=0.5 SENSI... Sensitive <=0.5 SENSI... Sensitive Inducible Clindamycin NEGATIVE Sensitive NEGATIVE Sensitive OXACILLIN >=4 RESISTANT Resistant 2 SENSITIVE Sensitive RIFAMPIN <=0.5 SENSI... Sensitive <=0.5 SENSI... Sensitive TETRACYCLINE <=1 SENSITIVE Sensitive <=1 SENSITIVE Sensitive TRIMETH/SULFA <=10 SENSIT Sensitive <=10 SENSIT Sensitive ... Marland Kitchen.. VANCOMYCIN 1 SENSITIVE Sensitive <=0.5 SENSI.Marland KitchenMarland Kitchen  Sensitive Right foot bone . Component 3 wk ago Specimen Description BONE Special Requests RIGHT 4 METATARSAL SAMPLE B Gram Stain NO WBC SEEN NO ORGANISMS SEEN Culture RARE METHICILLIN RESISTANT STAPHYLOCOCCUS AUREUS NO ANAEROBES ISOLATED Performed at [facility], [address] [place], [place] [address] Report Status 10/08/2019 FINAL Organism ID, Bacteria METHICILLIN RESISTANT STAPHYLOCOCCUS AUREUS Resulting Agency [facility] Susceptibility [place], [name] ([id number]) [id number] Page 4 of  21 Methicillin resistant staphylococcus aureus MIC CIPROFLOXACIN >=8 RESISTANT Resistant CLINDAMYCIN <=0.25 SENS... Sensitive ERYTHROMYCIN >=8 RESISTANT Resistant GENTAMICIN <=0.5 SENSI... Sensitive Inducible Clindamycin NEGATIVE Sensitive OXACILLIN >=4 RESISTANT Resistant RIFAMPIN <=0.5 SENSI... Sensitive TETRACYCLINE <=1 SENSITIVE Sensitive TRIMETH/SULFA <=10 SENSIT Sensitive ... VANCOMYCIN 1 SENSITIVE Sensitive This is a patient we had in clinic earlier this year with a wound over his left fifth metatarsal head. 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 Victorino Dike on 05/16/2019. He seems to have developed problems with wounds on his bilateral feet in June 2021 although he may have had problems earlier than this. He was in an urgent care with a right foot ulcer on 09/26/2019 and given a course of doxycycline. This was apparently after having trouble getting into see orthopedics. He was seen by podiatry on 09/28/2019 noted to have bilateral lower extremity ulcers including the left lateral fifth metatarsal base and the right subfifth met head. It was noted that had purulent drainage at that time. He required hospitalization from 6/20 through 7/2. This was because of worsening right foot wounds. He underwent bilateral operative incision and drainage and bone biopsies bilaterally. Culture results are listed above. He has been referred back to clinic by Dr. Ardelle Anton of podiatry. He is also followed by Dr. Orvan Falconer who saw him yesterday. He was discharged from hospital on Zyvox Flagyl and Levaquin and yesterday changed to doxycycline Flagyl and Levaquin. His inflammatory markers on 6/26 showed a sedimentation rate of 129 and  a C-reactive protein of 5. This is improved to 14 and 1.3 respectively. This would indicate improvement. ABIs in our clinic today were 1.23 on the right and 1.20 on the left 11/01/2019 on evaluation today patient appears to be doing fairly well in regard to the wounds on his feet at this point. Fortunately there is no signs of active infection at this time. No fevers, chills, nausea, vomiting, or diarrhea. He currently is seeing infectious disease and still under their care at this point. Subsequently he also has both wounds which she has not been using collagen on as he did not receive that in his packaging he did not call us and let us know that. Apparently that just was missed on the order. Nonetheless we will get that straightened out today. 8/9-Patient returns for bilateral foot wounds, using Prisma with hydrogel moistened dressings, and the wounds appear stable. Patient using surgical shoes, avoiding much pressure or weightbearing as much as possible 8/16; patient has bilateral foot wounds. 1 on the right lateral foot proximally the other is on the left mid lateral foot. Both required debridement of callus and thick skin around the wounds. We have been using silver collagen 8/27; patient has bilateral lateral foot wounds. The area on the left substantially surrounded by callus and dry skin. This was removed from the wound edge.  The underlying wound is small. The area on the right measured somewhat smaller today. 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 Unfortunately I did not update his antibiotics during today's visit. 9/10 I reviewed Dr. Blair Dolphinampbell's last notes he felt he had completed antibiotics his inflammatory markers were reasonably well controlled. He has a small wound on the lateral left foot and a tiny area on the right which is just above closed. He is using Hydrofera Blue with border foam he has bilateral surgical  shoes 9/24; 2 week f/u. doing well. right foot is closed. left foot still undermined. 10/14; right foot remains closed at the fifth met head. The area over the base of the left fifth metatarsal has a small open area but considerable undermining towards the plantar foot. Thick callus skin around this suggests an adequate pressure relief. We have talked about this. He says he is going to go back into his cam boot. I suggested a total contact cast he did not seem enamored with this suggestion 10/26; left foot base of the fifth metatarsal. Same condition as last time. He has skin over the area with an open wound however the skin is not adherent. He went to see Dr. Loreta AveWagner who did an x-ray and culture of his foot I have not reviewed the x-ray but the patient was not told anything. He is on doxycycline 11/11; since the patient was last here he was in the emergency room on 10/30 he was concerned about swelling in the left foot. They did not do any cultures or x-rays. They changed his antibiotics to cephalexin. Previous culture showed group B strep. The cephalexin is appropriate as doxycycline has less than predictable coverage. Arrives in clinic today with swelling over this area under the wound. He also has a new wound on the right fifth metatarsal head 11/18; the patient has a difficult wound on the lateral aspect of the left fifth metatarsal head. The wound was almost ballotable last week I opened it slightly expecting to see purulence however there was just bleeding. I cultured this this was negative. X-ray unchanged. We are trying to get an MRI but I am not sure were going to be able to get this through his insurance. He also has an area on the right lateral fifth metatarsal head this looks healthier 12/3; the patient finally got our MRI. Surprisingly this did not show osteomyelitis. I did show the soft tissue ulceration at the lateral plantar aspect of the fifth metatarsal base with a tiny residual 6 mm  abscess overlying the superficial fascia I have tried to culture this area I have not been able to get this to grow anything. Nevertheless the protruding tissue looks aggravated. I suspect we should try to treat the underlying "abscess with broad-spectrum antibiotics. I am going to start him on Levaquin and Flagyl. He has much less edema in his legs and I am going to continue to wrap his legs and see him weekly 12/10. I started Levaquin and Flagyl on him last week. He just picked up the Flagyl apparently there was some delay. The worry is the wound on the left fifth metatarsal base which is substantial and worsening. His foot looks like he inverts at the ankle making this a weightbearing surface. Certainly no improvement in fact I think the measurements of this are somewhat worse. We have been using 12/17; he apparently just got the Levaquin yesterday this is 2 weeks after  the fact. He has completed the Flagyl. The area over the left fifth metatarsal base still has protruding granulation tissue although it does not look quite as bad as it did some weeks ago. He has severe bilateral lymphedema although we have not been treating him for wounds on his legs this is definitely going to require compression. There was so much edema in the left I did not wish to put him in a total contact cast today. I am going to increase his compression from 3-4 layer. The area on the right lateral fifth met head actually look quite good and superficial. 12/23; patient arrived with callus on the right fifth met head and the substantial hyper granulated callused wound on the base of his fifth metatarsal. He says he is completing his Levaquin in 2 days but I do not think that adds up with what I gave him but I will have to double check this. We are using Hydrofera Blue on both areas. My plan is to put the left leg in a cast the week after New Year's 04/06/2020; patient's wounds about the same. Right lateral fifth metatarsal  head and left lateral foot over the base of the fifth metatarsal. There is undermining on the left lateral foot which I removed before application of total contact cast continuing with Hydrofera Blue new. Patient tells me he was seen by endocrinology today lab work was done [Dr. Kerr]. Also wondering whether he was referred to cardiology. I went over some lab work from previously does not have chronic renal failure certainly not nephrotic range proteinuria he does have very poorly controlled diabetes but this is not his most updated lab work. Hemoglobin A1c has been over 11 1/10; the patient had a considerable amount of leakage towards mid part of his left foot with macerated skin however the wound surface looks better the area on the right lateral fifth met head is better as well. I am going to change the dressing on the left foot under the total contact cast to silver alginate, continue with Hydrofera Blue on the right. 1/20; patient was in the total contact cast for 10 days. Considerable amount of drainage although the skin around the wound does not look too bad on the left foot. The area on the right fifth metatarsal head is closed. Our nursing staff reports large amount of drainage out of the left lateral foot wound 1/25; continues with copious amounts of drainage described by our intake staff. PCR culture I did last week showed E. coli and Enterococcus faecalis and low quantities. Multiple resistance genes documented including extended spectrum beta lactamase, MRSA, MRSE, quinolone, tetracycline. The wound is not quite as good this week as it was 5 days ago but about the same size 2/3; continues with copious amounts of malodorous drainage per our intake nurse. The PCR culture I did 2 weeks ago showed E. coli and low quantities of Enterococcus. There were multiple resistance genes detected. I put Neosporin on him last week although this does not seem to have helped. The wound is slightly deeper  today. Offloading continues to be an issue here although with the amount of drainage she has a total contact cast is just not going to work 2/10; moderate amount of drainage. Patient reports he cannot get his stocking on over the dressing. I told him we have to do that the nurse gave him Geffert, [name] ([id number]) [id number] Page 5 of 21 suggestions on how to make this work. The wound is on  the bottom and lateral part of his left foot. Is cultured predominantly grew low amounts of Enterococcus, E. coli and anaerobes. There were multiple resistance genes detected including extended spectrum beta lactamase, quinolone, tetracycline. I could not think of an easy oral combination to address this so for now I am going to do topical antibiotics provided by [facility] I think the main agents here are vancomycin and an aminoglycoside. We have to be able to give him access to the wounds to get the topical antibiotic on 2/17; moderate amount of drainage this is unchanged. He has his Keystone topical antibiotic against the deep tissue culture organisms. He has been using this and changing the dressing daily. Silver alginate on the wound surface. 2/24; using Keystone antibiotic with silver alginate on the top. He had too much drainage for a total contact cast at one point although I think that is improving and I think in the next week or 2 it might be possible to replace a total contact cast I did not do this today. In general the wound surface looks healthy however he continues to have thick rims of skin and subcutaneous tissue around the wide area of the circumference which I debrided 06/04/2020 upon evaluation today patient appears to be doing well in regard to his wound. I do feel like he is showing signs of improvement. There is little bit of callus and dead tissue around the edges of the wound as well as what appears to be a little bit of a sinus tract that is off to the side  laterally I would perform debridement to clear that away today. 3/17; left lateral foot. The wound looks about the same as I remember. Not much depth surface looks healthy. No evidence of infection 3/25; left lateral foot. Wound surface looks about the same. Separating epithelium from the circumference. There really is no evidence of infection here however not making progress by my view 3/29; left lateral foot. Surface of the wound again looks reasonably healthy still thick skin and subcutaneous tissue around the wound margins. There is no evidence of infection. One of the concerns being brought up by the nurses has again the amount of drainage vis--vis continued use of a total contact cast 4/5; left lateral foot at roughly the base of the fifth metatarsal. Nice healthy looking granulated tissue with rims of epithelialization. The overall wound measurements are not any better but the tissue looks healthy. The only concern is the amount of drainage although he has no surrounding maceration with what we have been doing recently to absorb fluid and protect his skin. He also has lymphedema. 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 He states that the kerlix/coban under the TCC rolled down and could not get it back up. He has been using an offloading scooter and has somehow hurt his right foot using this device. This happened last week. He states that the side of his right foot developed a blister and opened. The top of his foot also has a few small open wounds he thinks is due to his socks rubbing in his shoes. He has not been using any dressings to the wound. He denies purulent drainage, fever/chills or erythema to the wounds. 4/22; patient presents  for 1  week follow-up. He developed new wounds to the right foot that were evaluated at last clinic visit. He continues to have a total contact cast to the left leg and he reports no issues. He has been using silver collagen to the right foot wounds with no issues. He denies purulent drainage, fever/chills or erythema to the right foot wounds. He has no complaints today 4/25; patient presents for 1 week follow-up. He has a total contact cast of the left leg and reports no issues. He has been using silver alginate to the right foot wound. He denies purulent drainage, fever/chills or erythema to the right foot wounds. 5/2 patient presents for 1 week follow-up. T contact cast on the left. The wound which is on the base of the plantar foot at the base of the fifth metatarsal otal actually looks quite good and dimensions continue to gradually contract. HOWEVER the area on the right lateral fifth metatarsal head is much larger than what I remember from 2 weeks ago. Once more is he has significant levels of hypergranulation. Noteworthy that he had this same hyper granulated response on his wound on the left foot at one point in time. So much so that he I thought there was an underlying fluid collection. Based on this I think this just needs debridement. 5/9; the wound on the left actually continues to be gradually smaller with a healthy surface. Slight amount of drainage and maceration of the skin around but not too bad. However he has a large wound over the right fifth metatarsal head very much in the same configuration as his left foot wound was initially. I used silver nitrate to address the hyper granulated tissue no mechanical debridement 5/16; area on the left foot did not look as healthy this week deeper thick surrounding macerated skin and subcutaneous tissue. The area on the right foot fifth met head was about the same The area on the right ankle that we identified last week is completely broken down  into an open wound presumably a stocking rubbing issue 5/23; patient has been using a total contact cast to the left side. He has been using silver alginate underneath. He has also been using silver alginate to the right foot wounds. He has no complaints today. He denies any signs of infection. 5/31; the left-sided wound looks some better measure smaller surface granulation looks better. We have been using silver alginate under the total contact cast The large area on his right fifth met head and right dorsal foot look about the same still using silver alginate 6/6; neither side is good as I was hoping although the surface area dimensions are better. A lot of maceration on his left and right foot around the wound edge. Area on the dorsal right foot looks better. He says he was traveling. I am not sure what does the amount of maceration around the plantar wounds may be drainage issues 6/13; in general the wound surfaces look quite good on both sides. Macerated skin and raised edges around the wound required debridement although in general especially on the left the surface area seems improved. The area on the right dorsal ankle is about the same I thought this would not be such a problem to close 6/20; not much change in either wound although the one on the right looks a little better. Both wounds have thick macerated edges to the skin requiring debridements. We have been using silver alginate. The area on the dorsal right ankle  is still open I thought this would be closed. 6/28; patient comes in today with a marked deterioration in the right foot wound fifth met head. Wide area of exposed bone this is a drastic change from last time. The area on the left there we have been casting is stagnant. We have been using silver alginate in both wound areas. 7/5; bone culture I did for PCR last time was positive for Pseudomonas, group B strep, Enterococcus and Staph aureus. There was no suggestion of  methicillin resistance or ampicillin resistant genes. This was resistant to tetracycline however He comes into the clinic today with the area over his right plantar fifth metatarsal head which had been doing so well 2 weeks ago completely necrotic feeling bone. I do not know that this is going to be salvageable. The left foot wound is certainly no smaller but it has a better surface and is superficial. 7/8; patient called in this morning to say that his total contact cast was rubbing against his foot. He states he is doing fine overall. He denies signs of infection. 7/12; continued deterioration in the wound over the right fifth metatarsal head crumbling bone. This is not going to be salvageable. The patient agrees and wants to be referred to Dr. Victorino DikeHewitt which we will attempt to arrange as soon as possible. I am going to continue him on antibiotics as long as that takes so I will renew those today. The area on the left foot which is the base of the fifth metatarsal continues to look somewhat better. Healthy looking tissue no depth no debridement is necessary here. 7/20; the patient was kindly seen by Dr. Victorino DikeHewitt of orthopedics on 10/19/2020. 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 Towards this end we have taken him out of the total contact cast on the left we will put him in a wrap with Hydrofera Blue. As I understand things surgery is planned for 7/21 7/27; patient had his surgery last Thursday. 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 Apparently everything went well we did not still disturb that today The area on the left foot actually looks quite good. He has been much less mobile which probably explains this he did not seem to do well in the total contact cast secondary to drainage and maceration I think. We have been using Hydrofera Blue 11/09/2020 upon evaluation today patient appears to be doing well  with regard to his plantar foot ulcer on the left foot. Fortunately there is no evidence of active infection at this time. No fevers, chills, nausea, vomiting, or diarrhea. Overall I think that he is actually doing extremely well. Nonetheless I do believe that he Romano, [name] ([id number]) [id number] Page 6 of 21 is staying off of this more following the surgery in his right foot that is the reason the left is doing so great. 8/16; left plantar foot wound. This looks smaller than the last time I saw this he is using Hydrofera Blue. The surgical wound on the right foot is being followed by Dr. Victorino DikeHewitt we did not look at this today. He has surgical shoes on both feet 8/23; left plantar foot wound not as good this week. Surrounding macerated skin and subcutaneous tissue everything looks moist and wet. I do not think he is offloading this adequately. He is using a surgical shoe Apparently the right foot surgical wound is not  open although I did not check his foot 8/31; left plantar foot lateral aspect. Much improved this week. He has no maceration. Some improvement in the surface area of the wound but most impressively the depth is come in we are using silver alginate. The patient is a Veterinary surgeon. He is asked that we write him a letter so he can go back to work. I have also tried to see if we can write something that will allow him to limit the amount of time that he is on his foot at work. 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 Hopefully the latter can be avoided 9/6; I believe the patient missed an appointment last week. He arrives in today with a wound looking roughly the same certainly no better. Undermining laterally and also inferiorly. We used molecuLight today in training with the patient's permission.. We are using silver alginate 9/21 wound is measuring bigger this week  although this may have to do with the aggressive circumferential debridement last week in response to the blush fluorescence on the MolecuLight. Culture I did last week showed significant MSSA and E. coli. I put him on Augmentin but he has not started it yet. We are also going to send this for compounded antibiotics at [facility]. There is no evidence of systemic infection 9/29; silver alginate. His Keystone arrived. He is completing Augmentin in 2 days. Offloading in a cam boot. Moderate drainage per our intake staff 10/5; using silver alginate. He has been using his [place]. He has completed his Augmentin. Per our intake nurse still a lot of drainage, far too much to consider a total contact cast. Wound measures about the same. He had the same undermining area that I defined last week from a roughly 11-3. I remove this today 10/12; using silver alginate he is using the [place]. He comes in for a nurse visit hence we are applying Jodie Echevaria twice a week. Measuring slightly better today and less notable drainage. Extensive debridement of the wound edge last time 10/18; using topical Keystone and silver alginate and a soft cast. Wound measurements about the same. Drainage was through his soft cast. We are changing this twice a week Tuesdays and Friday 10/25; comes in with moderate drainage. Still using Keystone silver alginate and a soft cast. Wound dimensions completely the same.He has a lot of edema in the left leg he has lymphedema. Asking for [name] to consider wrapping him as he cannot get his stocking on over the soft cast 11/2; comes in with moderate to large drainage slightly smaller in terms of width we have been using [place]. His wound looks satisfactory but not much improvement 11/4; patient presents today for obligatory cast change. Has no issues or complaints today. He denies signs of infection. 11/9; patient traveled this weekend to [place], was on the cast quite a bit. Staining of the cast with  black material from his walking boot. Drainage was not quite as bad as we feared. Using silver alginate and Keystone 11/16; we do not have size for cast therefore we have been putting a soft cast on him since the change on Friday. Still a significant amount of drainage necessitating changing twice a week. We have been using the Keystone at cast changes either hard or soft as well as silver alginate Comes in the clinic with things actually looking fairly good improvement in width. He says his offloading is  about the same 02/24/2021 upon evaluation today patient actually comes back in and is doing excellent in regard to his foot ulcer this is significantly smaller even compared to the last visit. The soft cast seems to have done extremely well for him which is great news. I do not see any signs of infection minimal debridement will be needed today. 11/30; left lateral foot much improved half a centimeter improvement in surface area. No evidence of infection. He seems to be doing better with the soft cast in the TCC therefore we will continue with this. He comes back in later in the week for a change with the nurses. This is due to drainage 12/6; no improvement in dimensions. Under illumination some debris on the surface we have been using silver alginate, soft cast. If there is anything optimistic here he seems to have have less drainage 12/13. Dimensions are improved both length and width and slightly in depth. Appears to be quite healthy today. Raised edges of this thick skin and callus around the edges however. He is in a soft cast were bringing him back once for a change on Friday. Drainage is better 12/20. Dimensions are improved. He still has raised edges of thick skin and callus around the edges. We are using a soft cast 12/28; comes in today with thick callus around the wound. Using silver under alginate under a soft cast. I do not think there is much improvement in  any measurement 2023 04/06/2021; patient was put in a total contact cast. Unfortunately not much change in surface area 1/10; not much different still thick callus and skin around the edge in spite of the total contact cast. This was just debrided last week we have been using the [facility] compounded antibiotic and silver alginate under a total contact cast 1/18 the patient's wound on the left side is doing nicely. smaller HOWEVER he comes in today with a wound on the right foot laterally. blister most likely serosangquenous drainage 1/24; the patient continues to do well in terms of the plantar left foot which is continued to contract using silver alginate under the total contact cast HOWEVER the right lateral foot is bigger with denuded skin around the edges. I used pickups and a #15 scalpel to remove this this looks like the remanence of a large blister. Cannot rule out infection. Culture in this area I did last week showed Staphylococcus lugdunensis few colonies. I am going to try to address this with his Jodie Echevaria antibiotic that is done so well on the left having linezolid and this should cover the staph 2/1; the patient's wound on his left foot which was the original plantar foot wound thick skin and eschar around the edges even in the total contact cast but the wound surface does not look too bad The real problem is on how his right lateral foot at roughly the base of the fifth metatarsal. The wound is completely necrotic more worrisome than that there is swelling around the edges of this. We have been using silver alginate on both wounds and Keystone on the right foot. Unfortunately I think he is going to require systemic antibiotics while we await cultures. He did not get the x-ray done that we ordered last week [lost the prescription 2/7; disappointingly in the area on the left foot which we are treating with a total contact cast is still not closed although it is much smaller. He continues  to have a lot of callus around the wound edge. -Right  lateral foot culture I did last week was negative x-ray also negative for osteomyelitis. 2/15: TCC silver alginate on the left and silver alginate on the right lateral. No real improvement in either area 05/26/2021: T oday, the wounds are roughly the same size as at his previous visit, post-debridement. He continues to endorse fairly substantial drainage, particularly on the right. He has been in a total contact cast on the left. There is still some callus surrounding this lesion. On the right, the periwound skin is quite macerated, along with surrounding callus. The center of the right-sided wound also has some dark, densely adherent material, which is very difficult to remove. 06/02/2021: Today, both wounds are slightly smaller. He has been using zinc oxide ointment around the right ulcer and the degree of maceration has improved markedly. There continues to be an area of nonviable tissue in the center of the right sided ulcer. The left-sided wound, which has been in the total contact cast. Appears clean and the degree of callus around it is less than previously. 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 He had to take the stairs and both wounds have increased in size. The left foot, which has been in a total contact cast, has developed a tunnel tracking to the lateral aspect of his foot. The nonviable tissue in the center of the right-sided ulcer remains recalcitrant to debridement. There is significant undermining surrounding the entirety of the left sided wound. 06/16/2021: The elevator at school has been fixed and the patient has been able to avoid putting as much weight on his wounds over the past week. We converted the left foot wound into a single lesion today, but despite this, the wound is  actually smaller. The base is healthy with limited periwound callus. On the right, the central necrotic area is still present. He continues to be quite macerated around the right sided wound, despite applying barrier cream. This does, however, have the benefit of softening the callus to make it more easily removable. 06/23/2021: Today, the left wound is smaller. The lateral aspect that had opened up previously is now closed. The wound base has a healthy bed of granulation tissue and minimal slough. Unfortunately, on the right, the wound is larger and continues to be fairly macerated. He has also reopened the wound at his right ankle. He thinks this is due to the gait he has adopted secondary to his total contact cast and boot. 06/30/2021: T oday, both wounds are a little bit larger. The lateral aspect on the left has remained closed. He continues to have significant periwound maceration. The culture that I took from the right sided wound grew a population of bacteria that is not covered by his current [facility] antibiotic. The center of the right- sided wound continues to appear necrotic with nonviable fat. It probes deeper today, but does not reach bone. 07/07/2021: The periwound maceration is a little bit less today. The right lateral foot wound has some areas that appear more viable and the necrotic center also looks a little bit better. The wound on the dorsal surface of his right foot near the ankle is contracting and the surface appears healthy. The left plantar wound surface looks healthy, but there is some new undermining on the medial portion. He did get his new Keystone antibiotic and began applying that to the right foot wound on Saturday. 07/14/2021: The intake nurse  reported substantial drainage from his wounds, but the periwound skin actually looks better than is typical for him. The wound on the dorsal surface of his right foot near the ankle is smaller and just has a small open area  underneath some dried eschar. The left plantar wound surface looks healthy and there has been no significant accumulation of callus. The right lateral foot wound looks quite a bit better, with the central portion, which typically appears necrotic, looking more viable albeit pale. 07/22/2021: His left foot is extremely macerated today. The wound is about the same size. The wound on the dorsal surface of his right foot near the ankle had closed, but he traumatized it removing the dressing and there is a tiny skin tear in that location. The right lateral foot wound is bigger, but the surface appears healthy. 07/30/2021: The wound on the dorsal surface of his right foot near the ankle is closed. The right lateral foot wound again is a little bit bigger due to some undermining. The periwound skin is in better condition, however. He has been applying zinc oxide. The wound surface is a little bit dry today. On the left, he does not have the substantial maceration that we frequently see. The wound itself is smaller and has a clean surface. 08/06/2021: Both wounds seem to have deteriorated over the past week. The right lateral foot wound has a dry surface but the periwound is boggy.. Overall wound dimensions are about the same. On the left, the wound is about the same size, but there is more undermining present underneath periwound callus. 08/13/2021: The right sided wound looks about the same, but on the left there has been substantial deterioration. The undermining continues to extend under periwound callus. Once this was removed, substantial extension of the wound was present. There is no odor or purulent drainage but clearly the wounds have broken down. 08/20/2021: The wounds look about the same today. He has been out of his total contact cast and has just been changing the dressings using topical Keystone with PolyMem Ag, Kerlix and Ace bandages. The wound on the top of his right ankle has reopened but this is  quite small. There was a little bit of purulent material that I expressed when examining this wound. 08/24/2021: After the aggressive debridement I performed at his last visit, the wounds actually look a little bit better today. They are smaller with the exception of the wound on the top of his right ankle which is a little bit bigger as some more skin pulled off when he was changing his dressing. We are using topical Keystone with PolyMem Ag Kerlix and Ace bandages. 09/02/2021: There has been really no change to any of his wounds. 09/16/2021: The patient was hospitalized last week with nausea, vomiting, and dehydration. He says that while he was in the hospital, his wounds were not really addressed properly. T oday, both plantar foot wounds are larger and the periwound skin is macerated. The wound on the dorsum of his right foot has a scab on the top. The right foot now has a crater where previously he had had nonviable fat. It looks as though this simply died and fell out. The periwound callus is wet. 09/24/2021: His wounds have deteriorated somewhat since his last visit. The wound on the dorsum of his right foot near his ankle is larger and has more nonviable tissue present. The crater in his right foot is even deeper; I cannot quite palpate or probe to bone  but I am sure it is close. The wound on his left plantar foot has an odd boggy area in the center that almost feels as though it has fluid within it. He has run out of his topical Keystone antibiotic. We are using silver alginate on his wounds. 09/29/2021: He has developed a new wound on the dorsum of his left foot near his ankle. He says he thinks his wrapping is rubbing in that site. I would concur with this as the wound on his right ankle is larger. The left foot looks about the same. The right foot has the crater that was present last week. No significant slough accumulation, but his foot remains quite swollen and warm despite oral antibiotic  therapy. 10/08/2021: All of his wounds look about the same as last week. He did not start his oral antibiotics that are prescribed until just a couple of days ago; his Jodie Echevaria compounded antibiotics formula has been changed and he is awaiting delivery of the new recipe. His MRI that was scheduled for earlier this week was canceled as no prior authorization had been obtained; unfortunately the tech responsible sent an email to my old [employer] email, which I no longer use nor have access to. 10/18/2021: The wounds on his bilateral dorsal feet near the ankles are both improved. They are smaller and have just some eschar and slough buildup. The left plantar wound has a fair amount of undermining, but the surface is clean. There is some periwound callus accumulation. On the right plantar foot, there is nonviable fat leading to a deep tunnel that tracks towards his dorsal medial foot. There is periwound callus and slough accumulation, as well. His right foot and leg remain swollen as compared to the left. 10/25/2021: The wounds on his bilateral dorsal feet and at the ankles have broken down somewhat. They are little bit larger than last week. The left plantar wound continues to undermine laterally but the surface is clean. The right plantar foot wound shows some decreased depth in the tunnel tracking towards his dorsal medial foot. He has not yet had the Doppler study that I ordered; it sounds like there is some confusion about the scheduling of the procedure. In addition, the MRI was denied and I have taken steps to appeal the denial. 11/24/2021: Since our last visit, Mr. Lamoureaux was admitted to the hospital where an MRI suggested osteomyelitis. He was taken the operating room by podiatry. Bone biopsies were negative for osteomyelitis. They debrided his wounds and applied myriad matrix. He saw them last week and they removed his staples. He is here today to continue his wound healing process. T oday, both  of the dorsal foot/ankle wounds are substantially smaller. There is just a little eschar overlying the left sided wound and some eschar and slough on the right. The right plantar foot ulcer has the healthiest surface of granulation tissue that I have seen to date. A portion of the myriad matrix failed to take and was hanging loose. It appears that myriad morcells were placed into the tunnel closest to the dorsal portion of his foot. These have sloughed off. The left plantar foot ulcer is about the same size, but has a much healthier surface than in the past. Both plantar ulcers have callus and slough accumulation. 12/07/2021: Left dorsal foot/ankle wound is closed. The right dorsal foot/ankle wound is nearly closed and just has a small open area with some eschar and slough. The right plantar foot wound has contracted quite  a bit since our last visit. It has a healthy surface with just a little bit of slough accumulation and periwound callus buildup. The left plantar foot wound is about the same size but the surface appears healthy. There is a little slough and periwound callus on this side, as well. Barno, [name] ([id number]) [id number] Page 8 of 21 12/15/2021: Both dorsal foot/ankle wounds are closed. The right plantar foot wound is substantially smaller than at our last visit. The tunneling that was present has nearly closed. There is just a little bit of slough buildup. The left plantar foot wound is also a little bit smaller today. The surface is the healthiest that I have ever seen it. Light slough and periwound callus accumulation on this side. 12/22/2021: The dorsal ankle/foot wounds remain closed. The right plantar foot wound continues to contract. There is still a bit of depth at the lateral portion of the wound but the surface has a good granulated appearance. The wound on the left is about the same size, the central indented portion is still adherent. It also is  clean with good granulation tissue present. The periwound skin and callus are a bit macerated, however. 12/29/2021: Both ankle wounds remain closed. The right plantar foot wound is less than half the size that it was last week. There is no depth any longer and the surface has just a little bit of slough and periwound callus. On the left, the wound is not much smaller, but the surface is healthier with good granulation tissue. There is also a little slough and periwound callus buildup. 01/05/2022: The right plantar foot wound continues to contract and is once again about half the size as it was last week. There is just a little bit of surface slough and periwound callus. On the left, the depth of the wound has decreased and the diameter has started to contract. It is also very clean with just a little slough and biofilm present. 01/12/2022: The right plantar foot wound is smaller again today. There is just some periwound callus and slough accumulation. On the left, there is a fair amount of undermining and the surface is a little boggy, but no other significant change. 01/19/2022: The right plantar wound continues to contract. There is minimal periwound callus accumulation and just a light layer of slough on the surface. On the left, the surface looks better, but there is fairly significant undermining near the 11:00 portion of the wound, aiming towards his great toe. The skin overlying this area is very healthy and has a good fat layer present. No malodor or purulent drainage. 01/26/2022: The right sided wound continues to contract and has just a light layer of slough on the surface. Minimal periwound callus. On the left, he still has substantial undermining and the tissue surface is not as robust as I would like to see. No malodor or purulent drainage, but he did say he had a lot of serous drainage over the weekend. 02/02/2022: The right foot wound is about a quarter of the size that it was at his last  visit. There is just a little slough on the surface and some periwound callus. On the left, the undermining persists and the tissue is still more pale, but he does not seem to have had as much drainage over the last weekend. We are waiting on a wound VAC. 02/09/2022: His right foot has healed. He received the wound VAC, but did not bring it to clinic with him today. The lateral aspect  of the left foot wound has come in a little, but he still has substantial undermining on the medial and distal portion of the wound. Still with macerated periwound callus. 02/16/2022: The wound VAC was applied last Friday. T oday, there has been tremendous improvement in the surface of the wound bed. The undermined portion of the wound has come in a bit. There is still some overhanging dead skin. Overall the wound looks much better. 02/23/2022: No significant change in the overall wound dimensions, but the wound surface continues to improve and appears healthier and more robust. There is some periwound callus accumulation and overhanging skin. No concern for infection. 03/02/2022: Although the measurements taken in clinic today were unchanged, the visual appearance of the wound looks like it is smaller and the undermining/tunneling is filling with flesh. There is less periwound tissue maceration than usual. No malodor or purulent drainage. No concern for infection. 03/09/2022: The undermining has come in by couple of millimeters. The periwound is a little bit more macerated this week. No concern for infection. 12/13; substantial wound on the left plantar foot. We are using silver collagen and a wound VAC. 03/23/2022: The undermining continues to contract. The wound surface is a little bit dry. 03/30/2022: The undermining has essentially resolved. There is still some depth at the center of the wound. The wound surface has good beefy tissue and the moisture balance is better. 04/06/2022: The wound dimensions are about the  same, except that the depth is beginning to fill in. He has had more drainage this week, but there is no obvious concern for infection. 04/13/2022: He has built up a little bit of callus around the edges of the wound, creating some undermining. Otherwise the wound looks fairly unchanged. Electronic Signature(s) Signed: 04/13/2022 9:26:29 AM By: Duanne Guess MD FACS Previous Signature: 04/13/2022 9:20:21 AM Version By: Duanne Guess MD FACS Entered By: Duanne Guess on 04/13/2022 09:26:29 -------------------------------------------------------------------------------- Physical Exam Details Patient Name: Date of Service: [name], [name] 04/13/2022 8:45 A M Medical Record Number: [id number] Patient Account Number: [id number] Date of Birth/Sex: Treating RN: 15-Apr-1985 (36 y.o. M) Primary Care Provider: [name] Other Clinician: Referring Provider: Treating Provider/Extender: [name] in Treatment: 128 Constitutional Slightly hypertensive. . . . no acute distress. Respiratory Normal work of breathing on room air. Ndiaye, [name] ([id number]) [id number] Page 9 of 21 Notes 04/13/2022: He has built up a little bit of callus around the edges of the wound, creating some undermining. Otherwise the wound looks fairly unchanged. Electronic Signature(s) Signed: 04/13/2022 9:27:04 AM By: Duanne Guess MD FACS Entered By: Duanne Guess on 04/13/2022 09:27:04 -------------------------------------------------------------------------------- Physician Orders Details Patient Name: Date of Service: [name], [name] 04/13/2022 8:45 A M Medical Record Number: [id number] Patient Account Number: [id number] Date of Birth/Sex: Treating RN: 09/10/1985 (36 y.o. 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 - Dr. Lady Gary - Room 1 with Bonita Quin Return A Bathing/ Shower/ Hygiene May shower and wash wound with soap and water. Negative Presssure Wound Therapy Wound #3 Left,Lateral,Plantar Foot Wound Vac to wound continuously at 130mm/hg pressure Black Foam Edema Control - Lymphedema / SCD / Other Bilateral Lower Extremities Avoid standing  for long periods of time. Exercise regularly Compression stocking or Garment 20-30 mm/Hg pressure to: - to both legs daily Off-Loading Open toe surgical shoe to: - Both feet Additional Orders / Instructions Follow Nutritious Diet Non Wound Condition pply the following to affected area as directed: - continue to apply foam donut or cushion to healed right plantar foot A Wound Treatment Wound #3 - Foot Wound Laterality: Plantar, Left, Lateral Cleanser: Soap and Water 3 x Per Week/30 Days Discharge Instructions: May shower and wash wound with dial antibacterial soap and water prior to dressing change. Peri-Wound Care: Skin Prep (Generic) 3 x Per Week/30 Days Discharge Instructions: Use skin prep as directed Prim Dressing: Promogran Prisma Matrix, 4.34 (sq in) (silver collagen) 3 x Per Week/30 Days ary Discharge Instructions: Moisten collagen with saline or hydrogel Prim Dressing: VAC ary 3 x Per Week/30 Days Secondary Dressing: Zetuvit Plus 4x8 in 3 x Per Week/30 Days Discharge Instructions: Apply over primary dressing as directed. Fanara, [name] ([id number]) [id number] Page 10 of 21 Secured With: Elastic Bandage 4 inch (ACE bandage) (Generic) 3 x Per Week/30 Days Discharge Instructions: Secure with ACE bandage as directed. Secured With: 34M Medipore Scientist, research (life sciences)  Surgical T 2x10 (in/yd) (Generic) 3 x Per Week/30 Days ape Discharge Instructions: Secure with tape as directed. Compression Wrap: Kerlix Roll 4.5x3.1 (in/yd) (Dispense As Written) 3 x Per Week/30 Days Discharge Instructions: Apply Kerlix and Coban compression as directed. Electronic Signature(s) Signed: 04/13/2022 11:49:27 AM By: Duanne Guess MD FACS Signed: 04/15/2022 4:39:48 PM By: Tommie Ard RN Previous Signature: 04/13/2022 8:54:50 AM Version By: Tommie Ard RN Entered By: Tommie Ard on 04/13/2022 10:32:28 -------------------------------------------------------------------------------- Problem List Details Patient Name: Date of Service: [name], [name] 04/13/2022 8:45 A M Medical Record Number: [id number] Patient Account Number: [id number] Date of Birth/Sex: Treating RN: 1986-01-04 (36 y.o. [name] Primary Care Provider: [name] Other Clinician: Referring Provider: Treating Provider/Extender: [name] in Treatment: 128 Active Problems ICD-10 Encounter Code Description Active Date MDM Diagnosis L97.528 Non-pressure chronic ulcer of other part of left foot with other specified 10/24/2019 No Yes severity E11.621 Type 2 diabetes mellitus with foot ulcer 10/24/2019 No Yes Inactive Problems ICD-10 Code Description Active Date Inactive Date L97.518 Non-pressure chronic ulcer of other part of right foot with other specified severity 07/14/2020 07/14/2020 L97.518 Non-pressure chronic ulcer of other part of right foot with other specified severity 10/24/2019 10/24/2019 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, right ankle and foot 10/24/2019 10/24/2019 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 08/10/2020 08/10/2020 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, left ankle and foot 10/24/2019 10/24/2019 L97.322 Non-pressure chronic ulcer of left ankle with fat layer exposed 09/29/2021 09/29/2021 B95.62  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 10/24/2019 10/24/2019 classified elsewhere Glick, [name] ([id number]) [id number] Page 11 of 21 Resolved Problems Electronic Signature(s) Signed: 04/13/2022 9:18:49 AM By: Duanne Guess MD FACS Previous Signature: 04/13/2022 8:55:45 AM Version By: Tommie Ard RN Entered By: Duanne Guess on 04/13/2022 09:18:48 -------------------------------------------------------------------------------- Progress Note Details Patient Name: Date of Service: [name], [name] 04/13/2022 8:45 A M Medical Record Number: [id number] Patient Account Number: [id number] Date of Birth/Sex: Treating RN: March 07, 1986 (36 y.o. 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 He comes in for review of a wound over the plantar fifth metatarsal head extending into the lateral part of the foot. He was followed for this previously by his podiatrist Dr. Darreld Mclean. As the patient tells his story he went to see podiatry first for a swelling he developed on the lateral part of his fifth metatarsal head in May. He states this was "open" by podiatry and the area closed. He was followed up in June and it was again opened callus removed and it closed promptly. There were plans being made for surgery on the fifth metatarsal head in June however his  blood sugar was apparently too high for anesthesia. Apparently the area was debrided and opened again in June and it is never closed since. Looking over the records from podiatry I am really not able to follow this. It was clear when he was first seen it was before 5/14 at that point he already had a wound. By 5/17 the ulcer was resolved. I do not see anything about a procedure. On 5/28 noted to have pre-ulcerative moderate keratosis. X-ray noted 1/5 contracted toe and tailor's bunion and metatarsal deformity. On a visit date on 09/28/2018 the dorsal part of the left foot it healed and resolved. There was concern about swelling in his lower extremity he was sent to the ER.. As far as I can tell he was seen in the ER on 7/12 with an ulcer on his left foot. A DVT rule out of the left leg was negative. I do not think I have complete records from podiatry but I am not able to verify the procedures this patient states he had. He states after the last procedure the wound has never closed although I am not able to follow this in the records I have from podiatry. He has not had a recent x-ray The patient has been using Neosporin on the wound. He is wearing a Darco shoe. He is still very active up on his foot working and exercising. Past medical history; type 2 diabetes ketosis-prone, leg swelling with a negative DVT study in July. Non-smoker ABI in our clinic was 0.85 on the left 10/16; substantial wound on the plantar left fifth met head extending laterally almost to the dorsal fifth MTP. We have been using silver alginate we gave him a Darco forefoot off loader. An x-ray did not show evidence of osteomyelitis did note soft tissue emphysema which I think was due to gas tracking through an open wound. There is no doubt in my mind he requires an MRI 10/23; MRI not booked until 3 November at the earliest this is largely due to his glucose sensor in the right arm. We have been using silver alginate. There  has been an improvement 10/29; I am still not exactly sure when his MRI is booked for. He says it is the third but it is the 10th in epic. This definitely needs to be done. He is running a low-grade fever today but no other symptoms. No real improvement in the 1 02/26/2019 patient presents today for a follow-up visit here in our clinic he is last been seen in the clinic on October 29. Subsequently we were working on getting MRI to evaluate and see what exactly was going  on and where we would need to go from the standpoint of whether or not he had osteomyelitis and again what treatments were going be required. Subsequently the patient ended up being admitted to the hospital on 02/07/2019 and was discharged on 02/14/2019. This is a somewhat interesting admission with a discharge diagnosis of pneumonia due to COVID-19 although he was positive for COVID-19 when tested at the urgent care but negative x2 when he was actually in the hospital. With that being said he did have acute respiratory failure with hypoxia and it was noted he also have a left foot ulceration with osteomyelitis. With that being said he did require oxygen for his pneumonia and I level 4 L. He was placed on antivirals and steroids for the COVID-19. He was also transferred to the [facility] at one point. Nonetheless he did subsequently discharged home and since being home has done much better in that regard. The CT angiogram did not show any pulmonary embolism. With regard to the osteomyelitis the patient was placed on vancomycin and Zosyn while in the hospital but has been changed to Augmentin at discharge. It was also recommended that he follow- up with wound care and podiatry. Podiatry however wanted him to see [name] according to the patient prior to them doing anything further. His hemoglobin A1c was 9.9 as noted in the hospital. Have an MRI of the left foot performed while in the hospital on 02/04/2019. 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 There is an overlying plantar open wound noted an abscess tracking back along the lateral aspect of the fifth metatarsal shaft. There is otherwise diffuse cellulitis and mild fasciitis without findings of polymyositis. The patient did have recently pneumonia secondary to COVID-19 I looked in the chart through epic and it does appear that the patient may need to have an additional x-ray just to ensure everything is cleared and that he has no airspace disease prior to putting him into the chamber. 03/05/2019; patient was readmitted to the clinic last week. He was hospitalized twice for a viral upper respiratory tract infection from 11/1 through 11/4 and then 11/5 through 11/12 ultimately this turned out to be Covid pneumonitis. Although he was discharged on oxygen he is not using it. He says he feels fine. He has no exercise limitation no cough no sputum. His O2 sat in our clinic today was 100% on room air. 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 Purpura, [name] ([id number]) [id number] Page 12 of 21 He received Vanco and Zosyn in the hospital and then was discharged on 2 weeks of Augmentin. I do not see any relevant cultures. He was supposed to follow-up with infectious disease but I do not see that he has an appointment. 12/8; patient saw Dr. Ronnie Derby of infectious disease last week. He felt that he had had adequate antibiotic therapy. He did not go to follow-up with Dr. Logan Bores of podiatry and I have again talked to him about the pros and cons of this. He does not want to consider a ray amputation of this time. He is aware of the risks of recurrence, migration etc. He started HBO today and tolerated this well. He can complete the Augmentin that I  gave him last week. I have looked over the lab work that Dr. Effie Shy ordered  his C-reactive protein was 3.3 and his sedimentation rate was 17. The C-reactive protein is never really been measurably that high in this patient 12/15; not much change in the wound today however he has undermining along the lateral part of the foot again more extensively than last week. He has some rims of epithelialization. We have been using silver alginate. He is undergoing hyperbarics but did not dive today 12/18; in for his obligatory first total contact cast change. Unfortunately there was pus coming from the undermining area around his fifth metatarsal head. This was cultured but will preclude reapplication of a cast. He is seen in conjunction with HBO 12/24; patient had staph lugdunensis in the wound in the undermining area laterally last time. We put him on doxycycline which should have covered this. The wound looks better today. I am going to give him another week of doxycycline before reattempting the total contact cast 12/31; the patient is completing antibiotics. Hemorrhagic debris in the distal part of the wound with some undermining distally. He also had hyper granulation. Extensive debridement with a #5 curette. The infected area that was on the lateral part of the fifth met head is closed over. I do not think he needs any more antibiotics. Patient was seen prior to HBO. Preparations for a total contact cast were made in the cast will be placed post hyperbarics 04/11/19; once again the patient arrives today without complaint. He had been in a cast all week noted that he had heavy drainage this week. This resulted in large raised areas of macerated tissue around the wound 1/14; wound bed looks better slightly smaller. Hydrofera Blue has been changing himself. He had a heavy drainage last week which caused a lot of maceration around the wound so I took him out of a total contact cast he says the drainage is  actually better this week He is seen today in conjunction with HBO 1/21; returns to clinic. He was up in [place] for a day or 2 attending a funeral. He comes back in with the wound larger and with a large area of exposed bone. He had osteomyelitis and septic arthritis of the fifth left metatarsal head while he was in hospital. He received IV antibiotics in the hospital for a prolonged period of time then 3 weeks of Augmentin. Subsequently I gave him 2 weeks of doxycycline for more superficial wound infection. When I saw this last week the wound was smaller the surface of the wound looks satisfactory. 1/28; patient missed hyperbarics today. Bone biopsy I did last time showed Enterococcus faecalis and Staphylococcus lugdunensis . He has a wide area of exposed bone. We are going to use silver alginate as of today. I had another ethical discussion with the patient. 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 In this situation I think the likelihood of healing this is low. Therefore I have recommended a ray amputation and with the patient's agreement I have referred him to Dr. Victorino Dike. The other issue is that his compliance with hyperbarics has been minimal because of his work schedule and given his underlying decision I am going to stop this today READMISSION 10/24/2019 MRI 09/29/2019 left foot IMPRESSION: 1. Apparent skin ulceration inferior and lateral to the 5th metatarsal base with underlying heterogeneous T2 signal and enhancement in the subcutaneous fat. Small peripherally enhancing fluid collections along the plantar and lateral aspects of the 5th metatarsal base suspicious for abscesses. 2. Interval amputation through the mid 5th metatarsal with  nonspecific low-level marrow edema and enhancement. Given the proximity to the adjacent soft tissue inflammatory changes, osteomyelitis cannot be excluded. 3. The additional bones appear unremarkable. MRI 09/29/2019  right foot IMPRESSION: 1. Soft tissue ulceration lateral to the 5th MTP joint. There is low-level T2 hyperintensity within the 4th and 5th metatarsal heads and adjacent proximal phalanges without abnormal T1 signal or cortical destruction. These findings are nonspecific and could be seen with early marrow edema, hyperemia or early osteomyelitis. No evidence of septic joint. 2. Mild tenosynovitis and synovial enhancement associated with the extensor digitorum tendons at the level of the midfoot. 3. Diffuse low-level muscular T2 hyperintensity and enhancement, most consistent with diabetic myopathy. LEFT FOOT BONE Methicillin resistant staphylococcus aureus Staphylococcus lugdunensis MIC MIC CIPROFLOXACIN >=8 RESISTANT Resistant <=0.5 SENSI... Sensitive CLINDAMYCIN <=0.25 SENS... Sensitive >=8 RESISTANT Resistant ERYTHROMYCIN >=8 RESISTANT Resistant >=8 RESISTANT Resistant GENTAMICIN <=0.5 SENSI... Sensitive <=0.5 SENSI... Sensitive Inducible Clindamycin NEGATIVE Sensitive NEGATIVE Sensitive OXACILLIN >=4 RESISTANT Resistant 2 SENSITIVE Sensitive RIFAMPIN <=0.5 SENSI... Sensitive <=0.5 SENSI... Sensitive TETRACYCLINE <=1 SENSITIVE Sensitive <=1 SENSITIVE Sensitive TRIMETH/SULFA <=10 SENSIT Sensitive <=10 SENSIT Sensitive ... Marland Kitchen.. VANCOMYCIN 1 SENSITIVE Sensitive <=0.5 SENSI... Sensitive Right foot bone . Component 3 wk ago Specimen Description BONE Special Requests RIGHT 4 METATARSAL SAMPLE B Gram Stain NO WBC SEEN NO ORGANISMS SEEN Culture RARE METHICILLIN RESISTANT STAPHYLOCOCCUS AUREUS NO ANAEROBES ISOLATED Performed at [facility], [address] [place], [place] [address] Report Status 10/08/2019 FINAL Verdejo, [name] ([id number]) [id number] Page 13 of 21 Organism ID, Bacteria METHICILLIN RESISTANT STAPHYLOCOCCUS AUREUS Resulting Agency [facility] Susceptibility Methicillin resistant staphylococcus aureus MIC CIPROFLOXACIN >=8  RESISTANT Resistant CLINDAMYCIN <=0.25 SENS... Sensitive ERYTHROMYCIN >=8 RESISTANT Resistant GENTAMICIN <=0.5 SENSI... Sensitive Inducible Clindamycin NEGATIVE Sensitive OXACILLIN >=4 RESISTANT Resistant RIFAMPIN <=0.5 SENSI... Sensitive TETRACYCLINE <=1 SENSITIVE Sensitive TRIMETH/SULFA <=10 SENSIT Sensitive ... VANCOMYCIN 1 SENSITIVE Sensitive This is a patient we had in clinic earlier this year with a wound over his left fifth metatarsal head. 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 Victorino Dike on 05/16/2019. He seems to have developed problems with wounds on his bilateral feet in June 2021 although he may have had problems earlier than this. He was in an urgent care with a right foot ulcer on 09/26/2019 and given a course of doxycycline. This was apparently after having trouble getting into see orthopedics. He was seen by podiatry on 09/28/2019 noted to have bilateral lower extremity ulcers including the left lateral fifth metatarsal base and the right subfifth met head. It was noted that had purulent drainage at that time. He required hospitalization from 6/20 through 7/2. This was because of worsening right foot wounds. He underwent bilateral operative incision and drainage and bone biopsies bilaterally. Culture results are listed above. He has been referred back to clinic by Dr. Ardelle Anton of podiatry. He is also followed by Dr. Orvan Falconer who saw him yesterday. He was discharged from hospital on Zyvox Flagyl and Levaquin and yesterday changed to doxycycline Flagyl and Levaquin. His inflammatory markers on 6/26 showed a sedimentation rate of 129 and a C-reactive protein of 5. This is improved to 14 and 1.3  respectively. This would indicate improvement. ABIs in our clinic today were 1.23 on the right and 1.20 on the left  11/01/2019 on evaluation today patient appears to be doing fairly well in regard to the wounds on his feet at this point. Fortunately there is no signs of active infection at this time. No fevers, chills, nausea, vomiting, or diarrhea. He currently is seeing infectious disease and still under their care at this point. Subsequently he also has both wounds which she has not been using collagen on as he did not receive that in his packaging he did not call us and let us know that. Apparently that just was missed on the order. Nonetheless we will get that straightened out today. 8/9-Patient returns for bilateral foot wounds, using Prisma with hydrogel moistened dressings, and the wounds appear stable. Patient using surgical shoes, avoiding much pressure or weightbearing as much as possible 8/16; patient has bilateral foot wounds. 1 on the right lateral foot proximally the other is on the left mid lateral foot. Both required debridement of callus and thick skin around the wounds. We have been using silver collagen 8/27; patient has bilateral lateral foot wounds. The area on the left substantially surrounded by callus and dry skin. This was removed from the wound edge. The underlying wound is small. The area on the right measured somewhat smaller today. 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 Unfortunately I did not update his antibiotics during today's visit. 9/10 I reviewed Dr. Blair Dolphin last notes he felt he had completed antibiotics his inflammatory markers were reasonably well controlled. He has a small wound on the lateral left foot and a tiny area on the right which is just above closed. He is using Hydrofera Blue with border foam he has bilateral surgical shoes 9/24; 2 week f/u. doing well. right foot is closed.  left foot still undermined. 10/14; right foot remains closed at the fifth met head. The area over the base of the left fifth metatarsal has a small open area but considerable undermining towards the plantar foot. Thick callus skin around this suggests an adequate pressure relief. We have talked about this. He says he is going to go back into his cam boot. I suggested a total contact cast he did not seem enamored with this suggestion 10/26; left foot base of the fifth metatarsal. Same condition as last time. He has skin over the area with an open wound however the skin is not adherent. He went to see Dr. Loreta Ave who did an x-ray and culture of his foot I have not reviewed the x-ray but the patient was not told anything. He is on doxycycline 11/11; since the patient was last here he was in the emergency room on 10/30 he was concerned about swelling in the left foot. They did not do any cultures or x-rays. They changed his antibiotics to cephalexin. Previous culture showed group B strep. The cephalexin is appropriate as doxycycline has less than predictable coverage. Arrives in clinic today with swelling over this area under the wound. He also has a new wound on the right fifth metatarsal head 11/18; the patient has a difficult wound on the lateral aspect of the left fifth metatarsal head. The wound was almost ballotable last week I opened it slightly expecting to see purulence however there was just bleeding. I cultured this this was negative. X-ray unchanged. We are trying to get an MRI but I am not sure were going to be able to get this through his insurance. He also has an area on the right lateral fifth  metatarsal head this looks healthier 12/3; the patient finally got our MRI. Surprisingly this did not show osteomyelitis. I did show the soft tissue ulceration at the lateral plantar aspect of the fifth metatarsal base with a tiny residual 6 mm abscess overlying the superficial fascia I have tried to  culture this area I have not been able to get this to grow anything. Nevertheless the protruding tissue looks aggravated. I suspect we should try to treat the underlying "abscess with broad-spectrum antibiotics. I am going to start him on Levaquin and Flagyl. He has much less edema in his legs and I am going to continue to wrap his legs and see him weekly 12/10. I started Levaquin and Flagyl on him last week. He just picked up the Flagyl apparently there was some delay. The worry is the wound on the left fifth metatarsal base which is substantial and worsening. His foot looks like he inverts at the ankle making this a weightbearing surface. Certainly no improvement in fact I think the measurements of this are somewhat worse. We have been using 12/17; he apparently just got the Levaquin yesterday this is 2 weeks after the fact. He has completed the Flagyl. The area over the left fifth metatarsal base still has protruding granulation tissue although it does not look quite as bad as it did some weeks ago. He has severe bilateral lymphedema although we have not been treating him for wounds on his legs this is definitely going to require compression. There was so much edema in the left I did not wish to put him in a total contact cast today. I am going to increase his compression from 3-4 layer. The area on the right lateral fifth met head actually look quite good and superficial. 12/23; patient arrived with callus on the right fifth met head and the substantial hyper granulated callused wound on the base of his fifth metatarsal. He says he is completing his Levaquin in 2 days but I do not think that adds up with what I gave him but I will have to double check this. We are using Hydrofera Blue on both areas. My plan is to put the left leg in a cast the week after New Year's 04/06/2020; patient's wounds about the same. Right lateral fifth metatarsal head and left lateral foot over the base of the fifth  metatarsal. There is undermining on the left lateral foot which I removed before application of total contact cast continuing with Hydrofera Blue new. Patient tells me he was seen by endocrinology today lab work was done [Dr. Kerr]. Also wondering whether he was referred to cardiology. I went over some lab work from previously does not have chronic renal failure certainly not nephrotic range proteinuria he does have very poorly controlled diabetes but this is not his most updated lab work. Hemoglobin A1c has been over 11 1/10; the patient had a considerable amount of leakage towards mid part of his left foot with macerated skin however the wound surface looks better the area on the right lateral fifth met head is better as well. I am going to change the dressing on the left foot under the total contact cast to silver alginate, continue with Hydrofera Blue on the right. 1/20; patient was in the total contact cast for 10 days. Considerable amount of drainage although the skin around the wound does not look too bad on the left foot. The area on the right fifth metatarsal head is closed. Our nursing staff reports large  amount of drainage out of the left lateral foot wound 1/25; continues with copious amounts of drainage described by our intake staff. PCR culture I did last week showed E. coli and Enterococcus faecalis and low quantities. Multiple resistance genes documented including extended spectrum beta lactamase, MRSA, MRSE, quinolone, tetracycline. The wound is not quite as good this week as it was 5 days ago but about the same size 2/3; continues with copious amounts of malodorous drainage per our intake nurse. The PCR culture I did 2 weeks ago showed E. coli and low quantities of Cassel, [name] ([id number]) [id number] Page 14 of 21 Enterococcus. There were multiple resistance genes detected. I put Neosporin on him last week although this does not seem to have helped.  The wound is slightly deeper today. Offloading continues to be an issue here although with the amount of drainage she has a total contact cast is just not going to work 2/10; moderate amount of drainage. Patient reports he cannot get his stocking on over the dressing. I told him we have to do that the nurse gave him suggestions on how to make this work. The wound is on the bottom and lateral part of his left foot. Is cultured predominantly grew low amounts of Enterococcus, E. coli and anaerobes. There were multiple resistance genes detected including extended spectrum beta lactamase, quinolone, tetracycline. I could not think of an easy oral combination to address this so for now I am going to do topical antibiotics provided by [facility] I think the main agents here are vancomycin and an aminoglycoside. We have to be able to give him access to the wounds to get the topical antibiotic on 2/17; moderate amount of drainage this is unchanged. He has his Keystone topical antibiotic against the deep tissue culture organisms. He has been using this and changing the dressing daily. Silver alginate on the wound surface. 2/24; using Keystone antibiotic with silver alginate on the top. He had too much drainage for a total contact cast at one point although I think that is improving and I think in the next week or 2 it might be possible to replace a total contact cast I did not do this today. In general the wound surface looks healthy however he continues to have thick rims of skin and subcutaneous tissue around the wide area of the circumference which I debrided 06/04/2020 upon evaluation today patient appears to be doing well in regard to his wound. I do feel like he is showing signs of improvement. There is little bit of callus and dead tissue around the edges of the wound as well as what appears to be a little bit of a sinus tract that is off to the side laterally I would perform debridement to clear that away  today. 3/17; left lateral foot. The wound looks about the same as I remember. Not much depth surface looks healthy. No evidence of infection 3/25; left lateral foot. Wound surface looks about the same. Separating epithelium from the circumference. There really is no evidence of infection here however not making progress by my view 3/29; left lateral foot. Surface of the wound again looks reasonably healthy still thick skin and subcutaneous tissue around the wound margins. There is no evidence of infection. One of the concerns being brought up by the nurses has again the amount of drainage vis--vis continued use of a total contact cast 4/5; left lateral foot at roughly the base of the fifth metatarsal. Nice healthy looking granulated tissue with rims  of epithelialization. The overall wound measurements are not any better but the tissue looks healthy. The only concern is the amount of drainage although he has no surrounding maceration with what we have been doing recently to absorb fluid and protect his skin. He also has lymphedema. 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 He states that the kerlix/coban under the TCC rolled down and could not get it back up. He has been using an offloading scooter and has somehow hurt his right foot using this device. This happened last week. He states that the side of his right foot developed a blister and opened. The top of his foot also has a few small open wounds he thinks is due to his socks rubbing in his shoes. He has not been using any dressings to the wound. He denies purulent drainage, fever/chills or erythema to the wounds. 4/22; patient presents for 1 week follow-up. He developed new wounds to the right foot  that were evaluated at last clinic visit. He continues to have a total contact cast to the left leg and he reports no issues. He has been using silver collagen to the right foot wounds with no issues. He denies purulent drainage, fever/chills or erythema to the right foot wounds. He has no complaints today 4/25; patient presents for 1 week follow-up. He has a total contact cast of the left leg and reports no issues. He has been using silver alginate to the right foot wound. He denies purulent drainage, fever/chills or erythema to the right foot wounds. 5/2 patient presents for 1 week follow-up. T contact cast on the left. The wound which is on the base of the plantar foot at the base of the fifth metatarsal otal actually looks quite good and dimensions continue to gradually contract. HOWEVER the area on the right lateral fifth metatarsal head is much larger than what I remember from 2 weeks ago. Once more is he has significant levels of hypergranulation. Noteworthy that he had this same hyper granulated response on his wound on the left foot at one point in time. So much so that he I thought there was an underlying fluid collection. Based on this I think this just needs debridement. 5/9; the wound on the left actually continues to be gradually smaller with a healthy surface. Slight amount of drainage and maceration of the skin around but not too bad. However he has a large wound over the right fifth metatarsal head very much in the same configuration as his left foot wound was initially. I used silver nitrate to address the hyper granulated tissue no mechanical debridement 5/16; area on the left foot did not look as healthy this week deeper thick surrounding macerated skin and subcutaneous tissue. oo The area on the right foot fifth met head was about the same oo The area on the right ankle that we identified last week is completely broken down into an open wound presumably a stocking rubbing  issue 5/23; patient has been using a total contact cast to the left side. He has been using silver alginate underneath. He has also been using silver alginate to the right foot wounds. He has no complaints today. He denies any signs of infection. 5/31; the left-sided  wound looks some better measure smaller surface granulation looks better. We have been using silver alginate under the total contact cast oo The large area on his right fifth met head and right dorsal foot look about the same still using silver alginate 6/6; neither side is good as I was hoping although the surface area dimensions are better. A lot of maceration on his left and right foot around the wound edge. Area on the dorsal right foot looks better. He says he was traveling. I am not sure what does the amount of maceration around the plantar wounds may be drainage issues 6/13; in general the wound surfaces look quite good on both sides. Macerated skin and raised edges around the wound required debridement although in general especially on the left the surface area seems improved. oo The area on the right dorsal ankle is about the same I thought this would not be such a problem to close 6/20; not much change in either wound although the one on the right looks a little better. Both wounds have thick macerated edges to the skin requiring debridements. We have been using silver alginate. The area on the dorsal right ankle is still open I thought this would be closed. 6/28; patient comes in today with a marked deterioration in the right foot wound fifth met head. Wide area of exposed bone this is a drastic change from last time. The area on the left there we have been casting is stagnant. We have been using silver alginate in both wound areas. 7/5; bone culture I did for PCR last time was positive for Pseudomonas, group B strep, Enterococcus and Staph aureus. There was no suggestion of methicillin resistance or ampicillin resistant  genes. This was resistant to tetracycline however He comes into the clinic today with the area over his right plantar fifth metatarsal head which had been doing so well 2 weeks ago completely necrotic feeling bone. I do not know that this is going to be salvageable. The left foot wound is certainly no smaller but it has a better surface and is superficial. 7/8; patient called in this morning to say that his total contact cast was rubbing against his foot. He states he is doing fine overall. He denies signs of infection. 7/12; continued deterioration in the wound over the right fifth metatarsal head crumbling bone. This is not going to be salvageable. The patient agrees and wants to be referred to Dr. Victorino Dike which we will attempt to arrange as soon as possible. I am going to continue him on antibiotics as long as that takes so I will renew those today. The area on the left foot which is the base of the fifth metatarsal continues to look somewhat better. Healthy looking tissue no depth no debridement is necessary here. 7/20; the patient was kindly seen by Dr. Victorino Dike of orthopedics on 10/19/2020. 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 Towards this end we have taken him out of the total contact cast on the left we will put him in a wrap with Hydrofera Blue. As I understand things surgery is planned for 7/21 7/27; patient had his surgery last Thursday. 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 Apparently everything went well we did not still disturb that today The area on the left foot actually looks quite good. He has been much less mobile which probably explains this he  did not seem to do well in the total contact cast secondary to drainage and maceration I think. We have been using [facility] [place], [name] ([id number]) [id number] Page 15 of 21 11/09/2020 upon evaluation today  patient appears to be doing well with regard to his plantar foot ulcer on the left foot. Fortunately there is no evidence of active infection at this time. No fevers, chills, nausea, vomiting, or diarrhea. Overall I think that he is actually doing extremely well. Nonetheless I do believe that he is staying off of this more following the surgery in his right foot that is the reason the left is doing so great. 8/16; left plantar foot wound. This looks smaller than the last time I saw this he is using Hydrofera Blue. The surgical wound on the right foot is being followed by Dr. Victorino DikeHewitt we did not look at this today. He has surgical shoes on both feet 8/23; left plantar foot wound not as good this week. Surrounding macerated skin and subcutaneous tissue everything looks moist and wet. I do not think he is offloading this adequately. He is using a surgical shoe Apparently the right foot surgical wound is not open although I did not check his foot 8/31; left plantar foot lateral aspect. Much improved this week. He has no maceration. Some improvement in the surface area of the wound but most impressively the depth is come in we are using silver alginate. The patient is a [employer] teacher. He is asked that we write him a letter so he can go back to work. I have also tried to see if we can write something that will allow him to limit the amount of time that he is on his foot at work. 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 Hopefully the latter can be avoided 9/6; I believe the patient missed an appointment last week. He arrives in today with a wound looking roughly the same certainly no better. Undermining laterally and also inferiorly. We used molecuLight today in training with the patient's permission.. We are using silver alginate 9/21 wound is measuring bigger this week although this may have to do with the aggressive  circumferential debridement last week in response to the blush fluorescence on the MolecuLight. Culture I did last week showed significant MSSA and E. coli. I put him on Augmentin but he has not started it yet. [facility] There is no evidence of systemic infection 9/29; silver alginate. His Keystone arrived. He is completing Augmentin in 2 days. Offloading in a cam boot. Moderate drainage per our intake staff 10/5; using silver alginate. He has been using [place]. He has completed his Augmentin. Per our intake nurse still a lot of drainage, far too much to consider a total contact cast. Wound measures about the same. He had the same undermining area that I defined last week from a roughly 11-3. I remove this today 10/12; using silver alginate he is using the [place]. He comes in for a nurse visit hence we are applying [facility] twice a week. Measuring slightly better today and less notable drainage. Extensive debridement of the wound edge last time 10/18; using topical Keystone and silver alginate and a soft cast. Wound measurements about the same. Drainage was through his soft cast. We are changing this twice a week Tuesdays and Friday 10/25; comes in with  moderate drainage. Still using Keystone silver alginate and a soft cast. Wound dimensions completely the same.He has a lot of edema in the left leg he has lymphedema. Asking for [name] to consider wrapping him as he cannot get his stocking on over the soft cast 11/2; comes in with moderate to large drainage slightly smaller in terms of width we have been using [place]. His wound looks satisfactory but not much improvement 11/4; patient presents today for obligatory cast change. Has no issues or complaints today. He denies signs of infection. 11/9; patient traveled this weekend to [place], was on the cast quite a bit. Staining of the cast with black material from his walking boot. Drainage  was not quite as bad as we feared. Using silver alginate and Keystone 11/16; we do not have size for cast therefore we have been putting a soft cast on him since the change on Friday. Still a significant amount of drainage necessitating changing twice a week. We have been using the Keystone at cast changes either hard or soft as well as silver alginate Comes in the clinic with things actually looking fairly good improvement in width. He says his offloading is about the same 02/24/2021 upon evaluation today patient actually comes back in and is doing excellent in regard to his foot ulcer this is significantly smaller even compared to the last visit. The soft cast seems to have done extremely well for him which is great news. I do not see any signs of infection minimal debridement will be needed today. 11/30; left lateral foot much improved half a centimeter improvement in surface area. No evidence of infection. He seems to be doing better with the soft cast in the TCC therefore we will continue with this. He comes back in later in the week for a change with the nurses. This is due to drainage 12/6; no improvement in dimensions. Under illumination some debris on the surface we have been using silver alginate, soft cast. If there is anything optimistic here he seems to have have less drainage 12/13. Dimensions are improved both length and width and slightly in depth. Appears to be quite healthy today. Raised edges of this thick skin and callus around the edges however. He is in a soft cast were bringing him back once for a change on Friday. Drainage is better 12/20. Dimensions are improved. He still has raised edges of thick skin and callus around the edges. We are using a soft cast 12/28; comes in today with thick callus around the wound. Using silver under alginate under a soft cast. I do not think there is much improvement in any measurement 2023 04/06/2021; patient was put in a total contact cast.  Unfortunately not much change in surface area 1/10; not much different still thick callus and skin around the edge in spite of the total contact cast. This was just debrided last week we have been using the [facility] compounded antibiotic and silver alginate under a total contact cast 1/18 the patient's wound on the left side is doing nicely. smaller HOWEVER he comes in today with a wound on the right foot laterally. blister most likely serosangquenous drainage 1/24; the patient continues to do well in terms of the plantar left foot which is continued to contract using silver alginate under the total contact cast HOWEVER the right lateral foot is bigger with denuded skin around the edges. I used pickups and a #15 scalpel to remove this this looks like the remanence of a large blister.  Cannot rule out infection. Culture in this area I did last week showed Staphylococcus lugdunensis few colonies. I am going to try to address this with his Jodie Echevaria antibiotic that is done so well on the left having linezolid and this should cover the staph 2/1; the patient's wound on his left foot which was the original plantar foot wound thick skin and eschar around the edges even in the total contact cast but the wound surface does not look too bad The real problem is on how his right lateral foot at roughly the base of the fifth metatarsal. The wound is completely necrotic more worrisome than that there is swelling around the edges of this. We have been using silver alginate on both wounds and Keystone on the right foot. Unfortunately I think he is going to require systemic antibiotics while we await cultures. He did not get the x-ray done that we ordered last week [lost the prescription 2/7; disappointingly in the area on the left foot which we are treating with a total contact cast is still not closed although it is much smaller. He continues to have a lot of callus around the wound edge. -Right lateral foot culture  I did last week was negative x-ray also negative for osteomyelitis. 2/15: TCC silver alginate on the left and silver alginate on the right lateral. No real improvement in either area 05/26/2021: T oday, the wounds are roughly the same size as at his previous visit, post-debridement. He continues to endorse fairly substantial drainage, particularly on the right. He has been in a total contact cast on the left. There is still some callus surrounding this lesion. On the right, the periwound skin is quite macerated, along with surrounding callus. The center of the right-sided wound also has some dark, densely adherent material, which is very difficult to remove. 06/02/2021: Today, both wounds are slightly smaller. He has been using zinc oxide ointment around the right ulcer and the degree of maceration has improved Dimond, [name] ([id number]) [id number] Page 16 of 21 markedly. There continues to be an area of nonviable tissue in the center of the right sided ulcer. The left-sided wound, which has been in the total contact cast. Appears clean and the degree of callus around it is less than previously. 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 He had to take the stairs and both wounds have increased in size. The left foot, which has been in a total contact cast, has developed a tunnel tracking to the lateral aspect of his foot. The nonviable tissue in the center of the right-sided ulcer remains recalcitrant to debridement. There is significant undermining surrounding the entirety of the left sided wound. 06/16/2021: The elevator at school has been fixed and the patient has been able to avoid putting as much weight on his wounds over the past week. We converted the left foot wound into a single lesion today, but despite this, the wound is actually smaller. The base is healthy with limited periwound callus. On the  right, the central necrotic area is still present. He continues to be quite macerated around the right sided wound, despite applying barrier cream. This does, however, have the benefit of softening the callus to make it more easily removable. 06/23/2021: Today, the left wound is smaller. The lateral aspect that had opened up previously is now closed. The wound base has a healthy bed of granulation tissue and minimal slough. Unfortunately, on the right, the  wound is larger and continues to be fairly macerated. He has also reopened the wound at his right ankle. He thinks this is due to the gait he has adopted secondary to his total contact cast and boot. 06/30/2021: T oday, both wounds are a little bit larger. The lateral aspect on the left has remained closed. He continues to have significant periwound maceration. The culture that I took from the right sided wound grew a population of bacteria that is not covered by his current [facility] antibiotic. The center of the right- sided wound continues to appear necrotic with nonviable fat. It probes deeper today, but does not reach bone. 07/07/2021: The periwound maceration is a little bit less today. The right lateral foot wound has some areas that appear more viable and the necrotic center also looks a little bit better. The wound on the dorsal surface of his right foot near the ankle is contracting and the surface appears healthy. The left plantar wound surface looks healthy, but there is some new undermining on the medial portion. He did get his new Keystone antibiotic and began applying that to the right foot wound on Saturday. 07/14/2021: The intake nurse reported substantial drainage from his wounds, but the periwound skin actually looks better than is typical for him. The wound on the dorsal surface of his right foot near the ankle is smaller and just has a small open area underneath some dried eschar. The left plantar wound surface looks healthy and there  has been no significant accumulation of callus. The right lateral foot wound looks quite a bit better, with the central portion, which typically appears necrotic, looking more viable albeit pale. 07/22/2021: His left foot is extremely macerated today. The wound is about the same size. The wound on the dorsal surface of his right foot near the ankle had closed, but he traumatized it removing the dressing and there is a tiny skin tear in that location. The right lateral foot wound is bigger, but the surface appears healthy. 07/30/2021: The wound on the dorsal surface of his right foot near the ankle is closed. The right lateral foot wound again is a little bit bigger due to some undermining. The periwound skin is in better condition, however. He has been applying zinc oxide. The wound surface is a little bit dry today. On the left, he does not have the substantial maceration that we frequently see. The wound itself is smaller and has a clean surface. 08/06/2021: Both wounds seem to have deteriorated over the past week. The right lateral foot wound has a dry surface but the periwound is boggy.. Overall wound dimensions are about the same. On the left, the wound is about the same size, but there is more undermining present underneath periwound callus. 08/13/2021: The right sided wound looks about the same, but on the left there has been substantial deterioration. The undermining continues to extend under periwound callus. Once this was removed, substantial extension of the wound was present. There is no odor or purulent drainage but clearly the wounds have broken down. 08/20/2021: The wounds look about the same today. He has been out of his total contact cast and has just been changing the dressings using topical Keystone with PolyMem Ag, Kerlix and Ace bandages. The wound on the top of his right ankle has reopened but this is quite small. There was a little bit of purulent material that I expressed when  examining this wound. 08/24/2021: After the aggressive debridement I performed at his  last visit, the wounds actually look a little bit better today. They are smaller with the exception of the wound on the top of his right ankle which is a little bit bigger as some more skin pulled off when he was changing his dressing. We are using topical Keystone with PolyMem Ag Kerlix and Ace bandages. 09/02/2021: There has been really no change to any of his wounds. 09/16/2021: The patient was hospitalized last week with nausea, vomiting, and dehydration. He says that while he was in the hospital, his wounds were not really addressed properly. T oday, both plantar foot wounds are larger and the periwound skin is macerated. The wound on the dorsum of his right foot has a scab on the top. The right foot now has a crater where previously he had had nonviable fat. It looks as though this simply died and fell out. The periwound callus is wet. 09/24/2021: His wounds have deteriorated somewhat since his last visit. The wound on the dorsum of his right foot near his ankle is larger and has more nonviable tissue present. The crater in his right foot is even deeper; I cannot quite palpate or probe to bone but I am sure it is close. The wound on his left plantar foot has an odd boggy area in the center that almost feels as though it has fluid within it. He has run out of his topical Keystone antibiotic. We are using silver alginate on his wounds. 09/29/2021: He has developed a new wound on the dorsum of his left foot near his ankle. He says he thinks his wrapping is rubbing in that site. I would concur with this as the wound on his right ankle is larger. The left foot looks about the same. The right foot has the crater that was present last week. No significant slough accumulation, but his foot remains quite swollen and warm despite oral antibiotic therapy. 10/08/2021: All of his wounds look about the same as last week. He did not  start his oral antibiotics that are prescribed until just a couple of days ago; his Jodie Echevaria compounded antibiotics formula has been changed and he is awaiting delivery of the new recipe. His MRI that was scheduled for earlier this week was canceled as no prior authorization had been obtained; unfortunately the tech responsible sent an email to my old [place] email, which I no longer use nor have access to. 10/18/2021: The wounds on his bilateral dorsal feet near the ankles are both improved. They are smaller and have just some eschar and slough buildup. The left plantar wound has a fair amount of undermining, but the surface is clean. There is some periwound callus accumulation. On the right plantar foot, there is nonviable fat leading to a deep tunnel that tracks towards his dorsal medial foot. There is periwound callus and slough accumulation, as well. His right foot and leg remain swollen as compared to the left. 10/25/2021: The wounds on his bilateral dorsal feet and at the ankles have broken down somewhat. They are little bit larger than last week. The left plantar wound continues to undermine laterally but the surface is clean. The right plantar foot wound shows some decreased depth in the tunnel tracking towards his dorsal medial foot. He has not yet had the Doppler study that I ordered; it sounds like there is some confusion about the scheduling of the procedure. In addition, the MRI was denied and I have taken steps to appeal the denial. 11/24/2021: Since our last  visit, Mr. Ferrin was admitted to the hospital where an MRI suggested osteomyelitis. He was taken the operating room by podiatry. Bone biopsies were negative for osteomyelitis. They debrided his wounds and applied myriad matrix. He saw them last week and they removed his staples. He is here today to continue his wound healing process. T oday, both of the dorsal foot/ankle wounds are substantially smaller. There is just a  little eschar overlying the left sided wound and some eschar and slough on the right. The right plantar foot ulcer has the healthiest surface of granulation tissue that I have seen to date. A portion of the myriad matrix failed to take and was hanging loose. It appears that myriad morcells were placed into the tunnel closest to the dorsal portion of his foot. These have sloughed off. The left plantar foot ulcer is about the same size, but has a much healthier surface than in the past. Both plantar ulcers have callus and slough accumulation. 12/07/2021: Left dorsal foot/ankle wound is closed. The right dorsal foot/ankle wound is nearly closed and just has a small open area with some eschar and Marcello, [name] ([id number]) [id number] Page 17 of 21 slough. The right plantar foot wound has contracted quite a bit since our last visit. It has a healthy surface with just a little bit of slough accumulation and periwound callus buildup. The left plantar foot wound is about the same size but the surface appears healthy. There is a little slough and periwound callus on this side, as well. 12/15/2021: Both dorsal foot/ankle wounds are closed. The right plantar foot wound is substantially smaller than at our last visit. The tunneling that was present has nearly closed. There is just a little bit of slough buildup. The left plantar foot wound is also a little bit smaller today. The surface is the healthiest that I have ever seen it. Light slough and periwound callus accumulation on this side. 12/22/2021: The dorsal ankle/foot wounds remain closed. The right plantar foot wound continues to contract. There is still a bit of depth at the lateral portion of the wound but the surface has a good granulated appearance. The wound on the left is about the same size, the central indented portion is still adherent. It also is clean with good granulation tissue present. The periwound skin and callus  are a bit macerated, however. 12/29/2021: Both ankle wounds remain closed. The right plantar foot wound is less than half the size that it was last week. There is no depth any longer and the surface has just a little bit of slough and periwound callus. On the left, the wound is not much smaller, but the surface is healthier with good granulation tissue. There is also a little slough and periwound callus buildup. 01/05/2022: The right plantar foot wound continues to contract and is once again about half the size as it was last week. There is just a little bit of surface slough and periwound callus. On the left, the depth of the wound has decreased and the diameter has started to contract. It is also very clean with just a little slough and biofilm present. 01/12/2022: The right plantar foot wound is smaller again today. There is just some periwound callus and slough accumulation. On the left, there is a fair amount of undermining and the surface is a little boggy, but no other significant change. 01/19/2022: The right plantar wound continues to contract. There is minimal periwound callus accumulation and just a light layer of slough on  the surface. On the left, the surface looks better, but there is fairly significant undermining near the 11:00 portion of the wound, aiming towards his great toe. The skin overlying this area is very healthy and has a good fat layer present. No malodor or purulent drainage. 01/26/2022: The right sided wound continues to contract and has just a light layer of slough on the surface. Minimal periwound callus. On the left, he still has substantial undermining and the tissue surface is not as robust as I would like to see. No malodor or purulent drainage, but he did say he had a lot of serous drainage over the weekend. 02/02/2022: The right foot wound is about a quarter of the size that it was at his last visit. There is just a little slough on the surface and some periwound  callus. On the left, the undermining persists and the tissue is still more pale, but he does not seem to have had as much drainage over the last weekend. We are waiting on a wound VAC. 02/09/2022: His right foot has healed. He received the wound VAC, but did not bring it to clinic with him today. The lateral aspect of the left foot wound has come in a little, but he still has substantial undermining on the medial and distal portion of the wound. Still with macerated periwound callus. 02/16/2022: The wound VAC was applied last Friday. T oday, there has been tremendous improvement in the surface of the wound bed. The undermined portion of the wound has come in a bit. There is still some overhanging dead skin. Overall the wound looks much better. 02/23/2022: No significant change in the overall wound dimensions, but the wound surface continues to improve and appears healthier and more robust. There is some periwound callus accumulation and overhanging skin. No concern for infection. 03/02/2022: Although the measurements taken in clinic today were unchanged, the visual appearance of the wound looks like it is smaller and the undermining/tunneling is filling with flesh. There is less periwound tissue maceration than usual. No malodor or purulent drainage. No concern for infection. 03/09/2022: The undermining has come in by couple of millimeters. The periwound is a little bit more macerated this week. No concern for infection. 12/13; substantial wound on the left plantar foot. We are using silver collagen and a wound VAC. 03/23/2022: The undermining continues to contract. The wound surface is a little bit dry. 03/30/2022: The undermining has essentially resolved. There is still some depth at the center of the wound. The wound surface has good beefy tissue and the moisture balance is better. 04/06/2022: The wound dimensions are about the same, except that the depth is beginning to fill in. He has had more  drainage this week, but there is no obvious concern for infection. 04/13/2022: He has built up a little bit of callus around the edges of the wound, creating some undermining. Otherwise the wound looks fairly unchanged. Patient History Information obtained from Patient. Family History No family history of Cancer, Diabetes, Hereditary Spherocytosis, Hypertension, Kidney Disease, Lung Disease, Seizures, Stroke, Thyroid Problems, Tuberculosis. Social History Never smoker, Marital Status - Single, Alcohol Use - Rarely, Drug Use - No History, Caffeine Use - Never. Medical History Eyes Denies history of Cataracts, Glaucoma, Optic Neuritis Ear/Nose/Mouth/Throat Denies history of Chronic sinus problems/congestion, Middle ear problems Hematologic/Lymphatic Denies history of Anemia, Hemophilia, Human Immunodeficiency Virus, Lymphedema, Sickle Cell Disease Respiratory Denies history of Aspiration, Asthma, Chronic Obstructive Pulmonary Disease (COPD), Pneumothorax, Sleep Apnea, Tuberculosis Cardiovascular Denies history of  Angina, Arrhythmia, Congestive Heart Failure, Coronary Artery Disease, Deep Vein Thrombosis, Hypertension, Hypotension, Myocardial Infarction, Peripheral Arterial Disease, Peripheral Venous Disease, Phlebitis, Vasculitis Gastrointestinal Denies history of Cirrhosis , Colitis, Crohnoos, Hepatitis A, Hepatitis B, Hepatitis C Endocrine Patient has history of Type II Diabetes Denies history of Type I Diabetes Immunological Denies history of Lupus Erythematosus, Raynaudoos, Scleroderma Integumentary (Skin) Lammert, [name] ([id number]) [id number] Page 18 of 21 Denies history of History of Burn Musculoskeletal Denies history of Gout, Rheumatoid Arthritis, Osteoarthritis, Osteomyelitis Neurologic Denies history of Dementia, Neuropathy, Quadriplegia, Paraplegia, Seizure Disorder Oncologic Denies history of Received Chemotherapy, Received  Radiation Psychiatric Denies history of Anorexia/bulimia, Confinement Anxiety Hospitalization/Surgery History - 11/1-11/06/2018- sepsis foot infection. - 11/4-11/5 02 sats low respiratory distress. Objective Constitutional Slightly hypertensive. no acute distress. Vitals Time Taken: 8:40 AM, Height: 77 in, Weight: 280 lbs, BMI: 33.2, Temperature: 97.7 F, Pulse: 83 bpm, Respiratory Rate: 16 breaths/min, Blood Pressure: 140/89 mmHg, Capillary Blood Glucose: 85 mg/dl. Respiratory Normal work of breathing on room air. General Notes: 04/13/2022: He has built up a little bit of callus around the edges of the wound, creating some undermining. Otherwise the wound looks fairly unchanged. Integumentary (Hair, Skin) Wound #3 status is Open. Original cause of wound was Trauma. The date acquired was: 10/02/2019. The wound has been in treatment 128 weeks. The wound is located on the Left,Lateral,Plantar Foot. The wound measures 3.6cm length x 3.9cm width x 0.9cm depth; 11.027cm^2 area and 9.924cm^3 volume. There is Fat Layer (Subcutaneous Tissue) exposed. Tunneling has been noted at 12:00 with a maximum distance of 0.6cm. Undermining begins at 12:00 and ends at 5:00 with a maximum distance of 0.4cm. There is a medium amount of serosanguineous drainage noted. The wound margin is distinct with the outline attached to the wound base. There is large (67-100%) red granulation within the wound bed. There is a small (1-33%) amount of necrotic tissue within the wound bed including Adherent Slough. The periwound skin appearance had no abnormalities noted for color. The periwound skin appearance exhibited: Callus, Maceration. Periwound temperature was noted as No Abnormality. 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 There was a Selective/Open Wound Skin/Epidermis Debridement with a total area of 1.2 sq cm performed by Duanne Guess, MD. With the following instrument(s): Curette Material removed includes Callus and Skin: Epidermis and. No specimens were taken. A time out was conducted at 08:57, prior to the start of the procedure. There was no bleeding. The procedure was tolerated well with a pain level of 0 throughout and a pain level of 0 following the procedure. Post Debridement Measurements: 3.6cm length x 3.9cm width x 0.9cm depth; 9.924cm^3 volume. Character of Wound/Ulcer Post Debridement requires further debridement. Severity of Tissue Post Debridement is: Fat layer exposed. Post procedure Diagnosis Wound #3: Same as Pre-Procedure General Notes: Scribed for Dr. Lady Gary by Tommie Ard, RN. Plan Follow-up Appointments: Return Appointment in 1 week. - Dr. Lady Gary - Room 1 with [facility] Shower/ Hygiene: May shower and wash wound with soap and water. Negative Presssure Wound Therapy: Wound #3 Left,Lateral,Plantar Foot: Marmol, [name] ([id number]) [id number] Page 19 of 21 Wound Vac to wound continuously at 160mm/hg pressure Black Foam Edema Control - Lymphedema / SCD / Other: Avoid standing for long periods  of time. Exercise regularly Compression stocking or Garment 20-30 mm/Hg pressure to: - to both legs daily Off-Loading: Open toe surgical shoe to: - Both feet Additional Orders / Instructions: Follow Nutritious Diet Non Wound Condition: Apply the following to affected area as directed: - continue to apply foam donut or cushion to healed right plantar foot WOUND #3: - Foot Wound Laterality: Plantar, Left, Lateral Cleanser: Soap and Water 3 x Per Week/30 Days Discharge Instructions: May shower and wash wound with dial antibacterial soap and  water prior to dressing change. Peri-Wound Care: Skin Prep (Generic) 3 x Per Week/30 Days Discharge Instructions: Use skin prep as directed Prim Dressing: Promogran Prisma Matrix, 4.34 (sq in) (silver collagen) (Generic) 3 x Per Week/30 Days ary Discharge Instructions: Moisten collagen with saline or hydrogel Prim Dressing: VAC 3 x Per Week/30 Days ary Secondary Dressing: Zetuvit Plus 4x8 in (Generic) 3 x Per Week/30 Days Discharge Instructions: Apply over primary dressing as directed. Secured With: Elastic Bandage 4 inch (ACE bandage) (Generic) 3 x Per Week/30 Days Discharge Instructions: Secure with ACE bandage as directed. Secured With: 25M Medipore Scientist, research (life sciences) Surgical T 2x10 (in/yd) (Generic) 3 x Per Week/30 Days ape Discharge Instructions: Secure with tape as directed. Com pression Wrap: Kerlix Roll 4.5x3.1 (in/yd) (Generic) 3 x Per Week/30 Days Discharge Instructions: Apply Kerlix and Coban compression as directed. 04/13/2022: He has built up a little bit of callus around the edges of the wound, creating some undermining. Otherwise the wound looks fairly unchanged. I used a curette to debride the callus from the wound edges and eliminate the undermining. We will continue to use Prisma silver collagen under a wound VAC. I think 1 part of the process that we are not addressing adequately is offloading. He is not working and is staying off his feet is much as possible, but he is wearing a standard surgical sandal when he does ambulate. I am going to change him into a peg assist shoe to try and better offload the site. He will follow-up in 1 week. Electronic Signature(s) Signed: 04/13/2022 9:28:22 AM By: Duanne Guess MD FACS Entered By: Duanne Guess on 04/13/2022 09:28:22 -------------------------------------------------------------------------------- HxROS Details Patient Name: Date of Service: [name], [name] 04/13/2022 8:45 A M Medical Record Number: [id number] Patient  Account Number: [id number] Date of Birth/Sex: Treating RN: 03-16-1986 (36 y.o. M) Primary Care Provider: [name] Other Clinician: Referring Provider: Treating Provider/Extender: [name] in Treatment: 128 Information Obtained From Patient Eyes Medical History: Negative for: Cataracts; Glaucoma; Optic Neuritis Ear/Nose/Mouth/Throat Medical History: Negative for: Chronic sinus problems/congestion; Middle ear problems Hematologic/Lymphatic Medical History: Negative for: Anemia; Hemophilia; Human Immunodeficiency Virus; Lymphedema; Sickle Cell Disease Respiratory Sobecki, [name] ([id number]) [id number] Page 20 of 21 Medical History: Negative for: Aspiration; Asthma; Chronic Obstructive Pulmonary Disease (COPD); Pneumothorax; Sleep Apnea; Tuberculosis Cardiovascular Medical History: Negative for: Angina; Arrhythmia; Congestive Heart Failure; Coronary Artery Disease; Deep Vein Thrombosis; Hypertension; Hypotension; Myocardial Infarction; Peripheral Arterial Disease; Peripheral Venous Disease; Phlebitis; Vasculitis Gastrointestinal Medical History: Negative for: Cirrhosis ; Colitis; Crohns; Hepatitis A; Hepatitis B; Hepatitis C Endocrine Medical History: Positive for: Type II Diabetes Negative for: Type I Diabetes Time with diabetes: 8 Treated with: Insulin Blood sugar tested every day: No Immunological Medical History: Negative for: Lupus Erythematosus; Raynauds; Scleroderma Integumentary (Skin) Medical History: Negative for: History of Burn Musculoskeletal Medical History: Negative for: Gout; Rheumatoid Arthritis; Osteoarthritis; Osteomyelitis Neurologic Medical History: Negative for: Dementia; Neuropathy; Quadriplegia; Paraplegia; Seizure Disorder Oncologic Medical History: Negative for: Received Chemotherapy; Received  Radiation Psychiatric Medical History: Negative for: Anorexia/bulimia; Confinement  Anxiety Immunizations Pneumococcal Vaccine: Received Pneumococcal Vaccination: No Implantable Devices None Hospitalization / Surgery History Type of Hospitalization/Surgery 11/1-11/06/2018- sepsis foot infection 11/4-11/5 02 sats low respiratory distress Family and Social History Cancer: No; Diabetes: No; Hereditary Spherocytosis: No; Hypertension: No; Kidney Disease: No; Lung Disease: No; Seizures: No; Stroke: No; Thyroid Problems: No; Tuberculosis: No; Never smoker; Marital Status - Single; Alcohol Use: Rarely; Drug Use: No History; Caffeine Use: Never; Financial Concerns: No; Food, Clothing or Shelter Needs: No; Support System Lacking: No; Transportation Concerns: No Electronic Signature(s) Signed: 04/13/2022 11:49:27 AM By: Duanne Guess MD FACS Entered By: Duanne Guess on 04/13/2022 09:26:38 Kowalski, [name] ([id number]) [id number] Page 21 of 21 -------------------------------------------------------------------------------- SuperBill Details Patient Name: Date of Service: Geoffry Paradise 04/13/2022 Medical Record Number: [id number] Patient Account Number: [id number] Date of Birth/Sex: Treating RN: 07-17-1985 (36 y.o. M) Primary Care Provider: [name] Other Clinician: Referring Provider: Treating Provider/Extender: [name] in Treatment: 128 Diagnosis Coding ICD-10 Codes Code Description [phone] Non-pressure chronic ulcer of other part of left foot with other specified severity E11.621 Type 2 diabetes mellitus with foot ulcer Facility Procedures : CPT4 Code: [id number] Description: 97597 - DEBRIDE WOUND 1ST 20 SQ CM OR < ICD-10 Diagnosis Description L97.528 Non-pressure chronic ulcer of other part of left foot with other specified severi Modifier: ty Quantity: 1 : CPT4 Code: 84132440 Description: 10272 - WOUND VAC-50 SQ CM OR LESS Modifier: Quantity: 1 Physician Procedures : CPT4 Code Description  Modifier [id number] 99214 - WC PHYS LEVEL 4 - EST PT 25 ICD-10 Diagnosis Description L97.528 Non-pressure chronic ulcer of other part of left foot with other specified severity E11.621 Type 2 diabetes mellitus with foot ulcer Quantity: 1 : [id number] 97597 - WC PHYS DEBR WO ANESTH 20 SQ CM ICD-10 Diagnosis Description L97.528 Non-pressure chronic ulcer of other part of left foot with other specified severity Quantity: 1 Electronic Signature(s) Signed: 04/13/2022 9:28:38 AM By: Duanne Guess MD FACS Entered By: Duanne Guess on 04/13/2022 09:28:37

## 2022-04-20 ENCOUNTER — Encounter (HOSPITAL_BASED_OUTPATIENT_CLINIC_OR_DEPARTMENT_OTHER): Payer: BC Managed Care – PPO | Admitting: General Surgery

## 2022-04-20 DIAGNOSIS — E11621 Type 2 diabetes mellitus with foot ulcer: Secondary | ICD-10-CM | POA: Diagnosis not present

## 2022-04-20 NOTE — Progress Notes (Signed)
[place], [name] [id number] Page 1 of 8 Visit Report for 04/20/2022 Arrival Information Details Patient Name: Date of Service: [name] 04/20/2022 8:45 A M Medical Record Number: [id number] Patient Account Number: [id number] Date of Birth/Sex: Treating RN: 09/26/1985 (37 y.o. [name] Primary Care Sienna Stonehocker: [name] Other Clinician: Referring Karmine Kauer: Treating Tashawn Greff/Extender: [name] in Treatment: 72 Visit Information History Since Last Visit Added or deleted any medications: No Patient Arrived: Ambulatory Any new allergies or adverse reactions: No Arrival Time: 08:51 Had a fall or experienced change in No Accompanied By: self activities of daily living that may affect Transfer Assistance: None risk of falls: Patient Identification Verified: Yes Signs or symptoms of abuse/neglect since last visito No Secondary Verification Process Completed: Yes Hospitalized since last visit: No Patient Requires Transmission-Based Precautions: No Implantable device outside of the clinic excluding No Patient Has Alerts: No cellular tissue based products placed in the center since last visit: Has Dressing in Place as Prescribed: Yes Pain Present Now: No Electronic Signature(s) Signed: 04/20/2022 5:25:22 PM By: Blanche East RN Entered By: Blanche East on 04/20/2022 08:52:01 -------------------------------------------------------------------------------- Encounter Discharge Information Details Patient Name: Date of Service: [name], [name] 04/20/2022 8:45 A M Medical Record Number: [id number] Patient Account Number: [id number] Date of Birth/Sex: Treating RN: 1986/01/27 (37 y.o. [name] Primary Care Tycho Cheramie: [name] Other Clinician: Referring Saintclair Schroader: Treating Jahquan Klugh/Extender: [name] in Treatment: 129 Encounter Discharge Information Items Post  Procedure Vitals Discharge Condition: Stable Temperature (F): 97.6 Ambulatory Status: Ambulatory Pulse (bpm): 76 Discharge Destination: Home Respiratory Rate (breaths/min): 18 Transportation: Private Auto Blood Pressure (mmHg): 151/89 Accompanied By: self Schedule Follow-up Appointment: Yes Clinical Summary of Care: Patient Declined Electronic Signature(s) Signed: 04/20/2022 5:34:06 PM By: Baruch Gouty RN, BSN Entered By: Baruch Gouty on 04/20/2022 09:46:39 [place], [name] ([id number]) [id number] Page 2 of 8 -------------------------------------------------------------------------------- Lower Extremity Assessment Details Patient Name: Date of Service: [name] 04/20/2022 8:45 A M Medical Record Number: [id number] Patient Account Number: [id number] Date of Birth/Sex: Treating RN: 08-04-1985 (37 y.o. [name] Primary Care Audreena Sachdeva: [name] Other Clinician: Referring Marsi Turvey: Treating Felisha Claytor/Extender: [name] in Treatment: 129 Edema Assessment Assessed: [name]: No] [Right: No] Edema: [Left: Ye] [Right: s] Calf Left: Right: Point of Measurement: 48 cm From Medial Instep 49 cm Ankle Left: Right: Point of Measurement: 11 cm From Medial Instep 31.5 cm Vascular Assessment Pulses: Dorsalis Pedis Palpable: [Left:Yes] Electronic Signature(s) Signed: 04/20/2022 5:25:22 PM By: Blanche East RN Entered By: Blanche East on 04/20/2022 09:02:42 -------------------------------------------------------------------------------- Multi Wound Chart Details Patient Name: Date of Service: [name], [name] 04/20/2022 8:45 A M Medical Record Number: [id number] Patient Account Number: [id number] Date of Birth/Sex: Treating RN: Oct 24, 1985 (37 y.o. M) Primary Care Nilaya Bouie: [name] Other Clinician: Referring Naomi Castrogiovanni: Treating Coral Soler/Extender: [name] in Treatment:  129 Vital Signs Height(in): 77 Pulse(bpm): 76 Weight(lbs): 280 Blood Pressure(mmHg): 151/89 Body Mass Index(BMI): 33.2 Temperature(F): 97.6 Respiratory Rate(breaths/min): 16 [3:Photos:] [N/A:N/A] Left, Lateral, Plantar Foot N/A N/A Wound Location: Trauma N/A N/A Wounding Event: Diabetic Wound/Ulcer of the Lower N/A N/A Primary Etiology: Extremity Type II Diabetes N/A N/A Comorbid History: 10/02/2019 N/A N/A Date Acquired: 129 N/A N/A Weeks of Treatment: Open N/A N/A Wound Status: No N/A N/A Wound Recurrence: 3.5x3.8x1 N/A N/A Measurements L x W x D (cm) 10.446 N/A N/A A (cm) : rea 10.446 N/A N/A Volume (cm) : -533.50% N/A N/A %  Reduction in A rea: -6230.90% N/A N/A % Reduction in Volume: 3 Position 1 (o'clock): 0.8 Maximum Distance 1 (cm): Yes N/A N/A Tunneling: Grade 2 N/A N/A Classification: Medium N/A N/A Exudate A mount: Serosanguineous N/A N/A Exudate Type: red, brown N/A N/A Exudate Color: Distinct, outline attached N/A N/A Wound Margin: Large (67-100%) N/A N/A Granulation A mount: Red N/A N/A Granulation Quality: Small (1-33%) N/A N/A Necrotic A mount: Fat Layer (Subcutaneous Tissue): Yes N/A N/A Exposed Structures: Fascia: No Tendon: No Muscle: No Joint: No Bone: No Small (1-33%) N/A N/A Epithelialization: Debridement - Selective/Open Wound N/A N/A Debridement: Pre-procedure Verification/Time Out 09:20 N/A N/A Taken: Callus N/A N/A Tissue Debrided: Skin/Epidermis N/A N/A Level: 13.3 N/A N/A Debridement A (sq cm): rea Curette N/A N/A Instrument: Minimum N/A N/A Bleeding: Pressure N/A N/A Hemostasis A chieved: Insensate N/A N/A Procedural Pain: Insensate N/A N/A Post Procedural Pain: Procedure was tolerated well N/A N/A Debridement Treatment Response: 3.5x3.8x1 N/A N/A Post Debridement Measurements L x W x D (cm) 10.446 N/A N/A Post Debridement Volume: (cm) Callus: Yes N/A N/A Periwound Skin  Texture: Maceration: Yes N/A N/A Periwound Skin Moisture: No Abnormalities Noted N/A N/A Periwound Skin Color: No Abnormality N/A N/A Temperature: Debridement N/A N/A Procedures Performed: Negative Pressure Wound Therapy Maintenance (NPWT) Treatment Notes Wound #3 (Foot) Wound Laterality: Plantar, Left, Lateral Cleanser Soap and Water Discharge Instruction: May shower and wash wound with dial antibacterial soap and water prior to dressing change. Peri-Wound Care Skin Prep Discharge Instruction: Use skin prep as directed Topical Primary Dressing Promogran Prisma Matrix, 4.34 (sq in) (silver collagen) Discharge Instruction: Moisten collagen with saline or hydrogel VAC Secondary Dressing Zetuvit Plus 4x8 in Discharge Instruction: Apply over primary dressing as directed. Secured With Elastic Bandage 4 inch (ACE bandage) Discharge Instruction: Secure with ACE bandage as directed. [place], [name] ([id number]) [id number] Page 4 of 8 74M Medipore Soft Cloth Surgical T 2x10 (in/yd) ape Discharge Instruction: Secure with tape as directed. Compression Wrap Kerlix Roll 4.5x3.1 (in/yd) Discharge Instruction: Apply Kerlix and Coban compression as directed. Compression Stockings Add-Ons Electronic Signature(s) Signed: 04/20/2022 9:51:10 AM By: Duanne Guess MD FACS Entered By: Duanne Guess on 04/20/2022 09:51:10 -------------------------------------------------------------------------------- Multi-Disciplinary Care Plan Details Patient Name: Date of Service: [name], [name] 04/20/2022 8:45 A M Medical Record Number: [id number] Patient Account Number: [id number] Date of Birth/Sex: Treating RN: July 21, 1985 (36 y.o. [name] Primary Care Elliette Seabolt: [name] Other Clinician: Referring Rabecka Brendel: Treating Jizelle Conkey/Extender: [name] in Treatment: 129 Multidisciplinary Care Plan reviewed with physician Active  Inactive Nutrition Nursing Diagnoses: Imbalanced nutrition Potential for alteratiion in Nutrition/Potential for imbalanced nutrition Goals: Patient/caregiver agrees to and verbalizes understanding of need to use nutritional supplements and/or vitamins as prescribed Date Initiated: 10/24/2019 Date Inactivated: 04/06/2020 Target Resolution Date: 04/03/2020 Goal Status: Met Patient/caregiver will maintain therapeutic glucose control Date Initiated: 10/24/2019 Target Resolution Date: 06/01/2022 Goal Status: Active Interventions: Assess HgA1c results as ordered upon admission and as needed Assess patient nutrition upon admission and as needed per policy Provide education on elevated blood sugars and impact on wound healing Provide education on nutrition Treatment Activities: Education provided on Nutrition : 02/02/2022 Notes: 11/17/20: Glucose control ongoing issue, target date extended. 01/26/21: Glucose management continues. Wound/Skin Impairment Nursing Diagnoses: Impaired tissue integrity Knowledge deficit related to ulceration/compromised skin integrity Goals: Patient/caregiver will verbalize understanding of skin care regimen Date Initiated: 10/24/2019 Target Resolution Date: 06/01/2022 Goal Status: Active Ulcer/skin breakdown will have a volume reduction of 30% by week 4 [place], [name] (  [id number]) [id number] Page 5 of 8 Date Initiated: 10/24/2019 Date Inactivated: 01/16/2020 Target Resolution Date: 01/10/2020 Unmet Reason: no change in Goal Status: Unmet measurements. Interventions: Assess patient/caregiver ability to obtain necessary supplies Assess patient/caregiver ability to perform ulcer/skin care regimen upon admission and as needed Assess ulceration(s) every visit Provide education on ulcer and skin care Notes: 11/17/20: Wound care regimen continues Electronic Signature(s) Signed: 04/20/2022 5:34:06 PM By: Baruch Gouty RN, BSN Entered By:  Baruch Gouty on 04/20/2022 09:05:43 -------------------------------------------------------------------------------- Negative Pressure Wound Therapy Maintenance (NPWT) Details Patient Name: Date of Service: [name] 04/20/2022 8:45 A M Medical Record Number: [id number] Patient Account Number: [id number] Date of Birth/Sex: Treating RN: [birth year] (37 y.o. [name] Primary Care Lore Polka: [name] Other Clinician: Referring Zayden Hahne: Treating Jeffry Vogelsang/Extender: [name] in Treatment: 129 NPWT Maintenance Performed for: Wound #3 Left, Lateral, Plantar Foot Performed By: Baruch Gouty, RN Type: VAC System Coverage Size (sq cm): 13.3 Pressure Type: Constant Pressure Setting: 125 mmHG Drain Type: None Primary Contact: Other : prisma Sponge/Dressing Type: Foam, Black Date Initiated: 02/11/2022 Dressing Removed: No Quantity of Sponges/Gauze Removed: 1 Canister Changed: No Canister Exudate Volume: 10 Dressing Reapplied: No Quantity of Sponges/Gauze Inserted: 1 Respones T Treatment: o good Days On NPWT : 69 Post Procedure Diagnosis Same as Pre-procedure Electronic Signature(s) Signed: 04/20/2022 5:34:06 PM By: Baruch Gouty RN, BSN Entered By: Baruch Gouty on 04/20/2022 09:12:41 -------------------------------------------------------------------------------- Pain Assessment Details Patient Name: Date of Service: [name], [name] 04/20/2022 8:45 A M Medical Record Number: [id number] Patient Account Number: [id number] Date of Birth/Sex: Treating RN: [birth year] (37 y.o. [name] Primary Care Julies Carmickle: [name] Other Clinician: Referring Keontre Defino: Treating Marvis Bakken/Extender: [name] in Treatment: [address] [name] ([id number]) [id number] Page 6 of 8 Active Problems Location of Pain Severity and Description of Pain Patient Has Paino No Site  Locations Rate the pain. Current Pain Level: 0 Pain Management and Medication Current Pain Management: Electronic Signature(s) Signed: 04/20/2022 5:25:22 PM By: Blanche East RN Entered By: Blanche East on 04/20/2022 08:54:58 -------------------------------------------------------------------------------- Patient/Caregiver Education Details Patient Name: Date of Service: [name], [name] 1/17/2024andnbsp8:45 A M Medical Record Number: [id number] Patient Account Number: [id number] Date of Birth/Gender: Treating RN: 20-Mar-1986 (37 y.o. [name] Primary Care Physician: [name] Other Clinician: Referring Physician: Treating Physician/Extender: [name] in Treatment: 129 Education Assessment Education Provided To: Patient Education Topics Provided Offloading: Methods: Explain/Verbal Responses: Reinforcements needed, State content correctly Pressure: Wound/Skin Impairment: Methods: Explain/Verbal Responses: Reinforcements needed, State content correctly Electronic Signature(s) Signed: 04/20/2022 5:34:06 PM By: Baruch Gouty RN, BSN Entered By: Baruch Gouty on 04/20/2022 09:06:29 [place], [name] ([id number]) [id number] Page 7 of 8 -------------------------------------------------------------------------------- Wound Assessment Details Patient Name: Date of Service: [name] 04/20/2022 8:45 A M Medical Record Number: [id number] Patient Account Number: [id number] Date of Birth/Sex: Treating RN: Aug 05, 1985 (37 y.o. [name] Primary Care Anysha Frappier: [name] Other Clinician: Referring Ridwan Bondy: Treating Ellee Wawrzyniak/Extender: [name] in Treatment: 129 Wound Status Wound Number: 3 Primary Etiology: Diabetic Wound/Ulcer of the Lower Extremity Wound Location: Left, Lateral, Plantar Foot Wound Status: Open Wounding Event: Trauma Comorbid History: Type II  Diabetes Date Acquired: 10/02/2019 Weeks Of Treatment: 129 Clustered Wound: No Photos Wound Measurements Length: (cm) 3 Width: (cm) 3 Depth: (cm) 1 Area: (cm) Volume: (cm) .5 % Reduction in Area: -533.5% .8 % Reduction in Volume: -6230.9% Epithelialization: Small (1-33%) 10.446 Tunneling: Yes 10.446 Position (o'clock):  3 Maximum Distance: (cm) 0.8 Undermining: No Wound Description Classification: Grade 2 Wound Margin: Distinct, outline attached Exudate Amount: Medium Exudate Type: Serosanguineous Exudate Color: red, brown Foul Odor After Cleansing: No Slough/Fibrino No Wound Bed Granulation Amount: Large (67-100%) Exposed Structure Granulation Quality: Red Fascia Exposed: No Necrotic Amount: Small (1-33%) Fat Layer (Subcutaneous Tissue) Exposed: Yes Necrotic Quality: Adherent Slough Tendon Exposed: No Muscle Exposed: No Joint Exposed: No Bone Exposed: No Periwound Skin Texture Texture Color No Abnormalities Noted: No No Abnormalities Noted: Yes Callus: Yes Temperature / Pain Temperature: No Abnormality Moisture No Abnormalities Noted: No Maceration: Yes Treatment Notes [place], [name] ([id number]) [id number] Page 8 of 8 Wound #3 (Foot) Wound Laterality: Plantar, Left, Lateral Cleanser Soap and Water Discharge Instruction: May shower and wash wound with dial antibacterial soap and water prior to dressing change. Peri-Wound Care Skin Prep Discharge Instruction: Use skin prep as directed Topical Primary Dressing Promogran Prisma Matrix, 4.34 (sq in) (silver collagen) Discharge Instruction: Moisten collagen with saline or hydrogel VAC Secondary Dressing Zetuvit Plus 4x8 in Discharge Instruction: Apply over primary dressing as directed. Secured With Elastic Bandage 4 inch (ACE bandage) Discharge Instruction: Secure with ACE bandage as directed. 45M Medipore Soft Cloth Surgical T 2x10 (in/yd) ape Discharge Instruction: Secure  with tape as directed. Compression Wrap Kerlix Roll 4.5x3.1 (in/yd) Discharge Instruction: Apply Kerlix and Coban compression as directed. Compression Stockings Add-Ons Electronic Signature(s) Signed: 04/20/2022 5:25:22 PM By: Tommie Ard RN Entered By: Tommie Ard on 04/20/2022 09:01:34 -------------------------------------------------------------------------------- Vitals Details Patient Name: Date of Service: [name], [name] 04/20/2022 8:45 A M Medical Record Number: [id number] Patient Account Number: [id number] Date of Birth/Sex: Treating RN: 09-28-1985 (36 y.o. [name] Primary Care Mykaylah Ballman: [name] Other Clinician: Referring Caelen Reierson: Treating Rhen Kawecki/Extender: [name] in Treatment: 129 Vital Signs Time Taken: 08:52 Temperature (F): 97.6 Height (in): 77 Pulse (bpm): 76 Weight (lbs): 280 Respiratory Rate (breaths/min): 16 Body Mass Index (BMI): 33.2 Blood Pressure (mmHg): 151/89 Reference Range: 80 - 120 mg / dl Electronic Signature(s) Signed: 04/20/2022 5:25:22 PM By: Tommie Ard RN Entered By: Tommie Ard on 04/20/2022 08:54:14

## 2022-04-21 NOTE — Progress Notes (Signed)
[place], [name] ([id number]) [id number] Page 1 of 21 Visit Report for 04/20/2022 Chief Complaint Document Details Patient Name: Date of Service: [name] D 04/20/2022 8:45 A M Medical Record Number: [id number] Patient Account Number: [id number] Date of Birth/Sex: Treating RN: Apr 18, 1985 (36 y.o. M) Primary Care Provider: [name] Other Clinician: Referring Provider: Treating Provider/Extender: [name] in Treatment: 129 Information Obtained from: Patient Chief Complaint 01/11/2019; patient is here for review of a rather substantial wound over the left fifth plantar metatarsal head extending into the lateral part of his foot 10/24/2019; patient returns to clinic with wounds on his bilateral feet with underlying osteomyelitis biopsy-proven Electronic Signature(s) Signed: 04/20/2022 9:51:26 AM By: Duanne Guess MD FACS Entered By: Duanne Guess on 04/20/2022 09:51:26 -------------------------------------------------------------------------------- Debridement Details Patient Name: Date of Service: [name], [name] 04/20/2022 8:45 A M Medical Record Number: [id number] Patient Account Number: [id number] Date of Birth/Sex: Treating RN: November 02, 1985 (36 y.o. [name] Primary Care Provider: [name] Other Clinician: Referring Provider: Treating Provider/Extender: [name] in Treatment: 129 Debridement Performed for Assessment: Wound #3 Left,Lateral,Plantar Foot Performed By: Physician Duanne Guess, MD Debridement Type: Debridement Severity of Tissue Pre Debridement: Fat layer exposed Level of Consciousness (Pre-procedure): Awake and Alert Pre-procedure Verification/Time Out Yes - 09:20 Taken: Start Time: 09:21 T Area Debrided (L x W): otal 3.5 (cm) x 3.8 (cm) = 13.3 (cm) Tissue and other material debrided: Non-Viable, Callus, Skin: Epidermis, Biofilm Level:  Skin/Epidermis Debridement Description: Selective/Open Wound Instrument: Curette Bleeding: Minimum Hemostasis Achieved: Pressure Procedural Pain: Insensate Post Procedural Pain: Insensate Response to Treatment: Procedure was tolerated well Level of Consciousness (Post- Awake and Alert procedure): Post Debridement Measurements of Total Wound Length: (cm) 3.5 Width: (cm) 3.8 Depth: (cm) 1 Volume: (cm) 10.446 Character of Wound/Ulcer Post Debridement: Improved Severity of Tissue Post Debridement: Fat layer exposed [place], [name] ([id number]) [id number] Page 2 of 21 Post Procedure Diagnosis Same as Pre-procedure Notes Scribed for Dr. Lady Gary by Zenaida Deed, RN Electronic Signature(s) Signed: 04/20/2022 5:34:06 PM By: Zenaida Deed RN, BSN Signed: 04/21/2022 7:39:33 AM By: Duanne Guess MD FACS Previous Signature: 04/20/2022 10:14:00 AM Version By: Duanne Guess MD FACS Entered By: Zenaida Deed on 04/20/2022 16:55:09 -------------------------------------------------------------------------------- HPI Details Patient Name: Date of Service: [name], [name] 04/20/2022 8:45 A M Medical Record Number: [id number] Patient Account Number: [id number] Date of Birth/Sex: Treating RN: 1985-07-18 (36 y.o. 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 He comes in for review of a wound over the plantar fifth metatarsal head extending into the lateral part of the foot. He was followed for this previously by his podiatrist Dr. Darreld Mclean. As the patient tells his story he went to see podiatry first for a swelling he developed on the lateral part of his fifth metatarsal head in May. He states this was  "open" by podiatry and the area closed. He was followed up in June and it was again opened callus removed and it closed promptly. There were plans being made for surgery on the fifth metatarsal head in June however his blood sugar was apparently too high for anesthesia. Apparently the area was debrided and opened again in June and it is never closed since. Looking over the records from podiatry I am really not able to  follow this. It was clear when he was first seen it was before 5/14 at that point he already had a wound. By 5/17 the ulcer was resolved. I do not see anything about a procedure. On 5/28 noted to have pre-ulcerative moderate keratosis. X-ray noted 1/5 contracted toe and tailor's bunion and metatarsal deformity. On a visit date on 09/28/2018 the dorsal part of the left foot it healed and resolved. There was concern about swelling in his lower extremity he was sent to the ER.. As far as I can tell he was seen in the ER on 7/12 with an ulcer on his left foot. A DVT rule out of the left leg was negative. I do not think I have complete records from podiatry but I am not able to verify the procedures this patient states he had. He states after the last procedure the wound has never closed although I am not able to follow this in the records I have from podiatry. He has not had a recent x-ray The patient has been using Neosporin on the wound. He is wearing a Darco shoe. He is still very active up on his foot working and exercising. Past medical history; type 2 diabetes ketosis-prone, leg swelling with a negative DVT study in July. Non-smoker ABI in our clinic was 0.85 on the left 10/16; substantial wound on the plantar left fifth met head extending laterally almost to the dorsal fifth MTP. We have been using silver alginate we gave him a Darco forefoot off loader. An x-ray did not show evidence of osteomyelitis did note soft tissue emphysema which I think was due to gas tracking through an open  wound. There is no doubt in my mind he requires an MRI 10/23; MRI not booked until 3 November at the earliest this is largely due to his glucose sensor in the right arm. We have been using silver alginate. There has been an improvement 10/29; I am still not exactly sure when his MRI is booked for. He says it is the third but it is the 10th in epic. This definitely needs to be done. He is running a low-grade fever today but no other symptoms. No real improvement in the 1 02/26/2019 patient presents today for a follow-up visit here in our clinic he is last been seen in the clinic on October 29. Subsequently we were working on getting MRI to evaluate and see what exactly was going on and where we would need to go from the standpoint of whether or not he had osteomyelitis and again what treatments were going be required. Subsequently the patient ended up being admitted to the hospital on 02/07/2019 and was discharged on 02/14/2019. This is a somewhat interesting admission with a discharge diagnosis of pneumonia due to COVID-19 although he was positive for COVID-19 when tested at the urgent care but negative x2 when he was actually in the hospital. With that being said he did have acute respiratory failure with hypoxia and it was noted he also have a left foot ulceration with osteomyelitis. With that being said he did require oxygen for his pneumonia and I level 4 L. He was placed on antivirals and steroids for the COVID-19. He was also transferred to the [facility] campus at one point. Nonetheless he did subsequently discharged home and since being home has done much better in that regard. The CT angiogram did not show any pulmonary embolism. With regard to the osteomyelitis the patient was placed on vancomycin and Zosyn while  in the hospital but has been changed to Augmentin at discharge. It was also recommended that he follow- up with wound care and podiatry. Podiatry however wanted him to see [name]  according to the patient prior to them doing anything further. His hemoglobin A1c was 9.9 as noted in the hospital. Have an MRI of the left foot performed while in the hospital on 02/04/2019. This showed evidence of septic arthritis at the fifth MTP joint and osteomyelitis involving the fifth metatarsal head and proximal phalanx. There is an overlying plantar open wound noted an abscess tracking back along the lateral aspect of the fifth metatarsal shaft. There is otherwise diffuse cellulitis and mild fasciitis without findings of polymyositis. The patient did have recently pneumonia secondary to COVID-19 I looked in the chart through epic and it does appear that the patient may need to have an additional x-ray just to ensure everything is cleared and that he has no airspace disease prior to putting him into the chamber. 03/05/2019; patient was readmitted to the clinic last week. He was hospitalized twice for a viral upper respiratory tract infection from 11/1 through 11/4 and then 11/5 through 11/12 ultimately this turned out to be Covid pneumonitis. Although he was discharged on oxygen he is not using it. He says he feels fine. He has no exercise limitation no cough no sputum. His O2 sat in our clinic today was 100% on room air. 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 He received Vanco and Zosyn in the hospital and then was discharged on 2 weeks of Augmentin. I do not see any relevant cultures. He was supposed to [place], [name] ([id number]) [id number] Page 3 of 21 follow-up with infectious disease but I do not see that he has an appointment. 12/8; patient saw Dr. Ronnie Derby of infectious disease last week. He felt that he had had adequate antibiotic therapy. He did not go to follow-up with Dr. Logan Bores of podiatry and I have again talked to him about the pros and  cons of this. He does not want to consider a ray amputation of this time. He is aware of the risks of recurrence, migration etc. He started HBO today and tolerated this well. He can complete the Augmentin that I gave him last week. I have looked over the lab work that Dr. Effie Shy ordered his C-reactive protein was 3.3 and his sedimentation rate was 17. The C-reactive protein is never really been measurably that high in this patient 12/15; not much change in the wound today however he has undermining along the lateral part of the foot again more extensively than last week. He has some rims of epithelialization. We have been using silver alginate. He is undergoing hyperbarics but did not dive today 12/18; in for his obligatory first total contact cast change. Unfortunately there was pus coming from the undermining area around his fifth metatarsal head. This was cultured but will preclude reapplication of a cast. He is seen in conjunction with HBO 12/24; patient had staph lugdunensis in the wound in the undermining area laterally last time. We put him on doxycycline which should have covered this. The wound looks better today. I am going to give him another week of doxycycline before reattempting the total contact cast 12/31; the patient is completing antibiotics. Hemorrhagic debris in the distal part of the wound with some undermining distally. He also had hyper granulation. Extensive debridement  with a #5 curette. The infected area that was on the lateral part of the fifth met head is closed over. I do not think he needs any more antibiotics. Patient was seen prior to HBO. Preparations for a total contact cast were made in the cast will be placed post hyperbarics 04/11/19; once again the patient arrives today without complaint. He had been in a cast all week noted that he had heavy drainage this week. This resulted in large raised areas of macerated tissue around the wound 1/14; wound bed looks better  slightly smaller. Hydrofera Blue has been changing himself. He had a heavy drainage last week which caused a lot of maceration around the wound so I took him out of a total contact cast he says the drainage is actually better this week He is seen today in conjunction with HBO 1/21; returns to clinic. He was up in [place] for a day or 2 attending a funeral. He comes back in with the wound larger and with a large area of exposed bone. He had osteomyelitis and septic arthritis of the fifth left metatarsal head while he was in hospital. He received IV antibiotics in the hospital for a prolonged period of time then 3 weeks of Augmentin. Subsequently I gave him 2 weeks of doxycycline for more superficial wound infection. When I saw this last week the wound was smaller the surface of the wound looks satisfactory. 1/28; patient missed hyperbarics today. Bone biopsy I did last time showed Enterococcus faecalis and Staphylococcus lugdunensis . He has a wide area of exposed bone. We are going to use silver alginate as of today. I had another ethical discussion with the patient. 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 In this situation I think the likelihood of healing this is low. Therefore I have recommended a ray amputation and with the patient's agreement I have referred him to Dr. Victorino Dike. The other issue is that his compliance with hyperbarics has been minimal because of his work schedule and given his underlying decision I am going to stop this today READMISSION 10/24/2019 MRI 09/29/2019 left foot IMPRESSION: 1. Apparent skin ulceration inferior and lateral to the 5th metatarsal base with underlying heterogeneous T2 signal and enhancement in the subcutaneous fat. Small peripherally enhancing fluid collections along the plantar and lateral aspects of the 5th metatarsal base suspicious for abscesses. 2. Interval amputation through the mid 5th metatarsal  with nonspecific low-level marrow edema and enhancement. Given the proximity to the adjacent soft tissue inflammatory changes, osteomyelitis cannot be excluded. 3. The additional bones appear unremarkable. MRI 09/29/2019 right foot IMPRESSION: 1. Soft tissue ulceration lateral to the 5th MTP joint. There is low-level T2 hyperintensity within the 4th and 5th metatarsal heads and adjacent proximal phalanges without abnormal T1 signal or cortical destruction. These findings are nonspecific and could be seen with early marrow edema, hyperemia or early osteomyelitis. No evidence of septic joint. 2. Mild tenosynovitis and synovial enhancement associated with the extensor digitorum tendons at the level of the midfoot. 3. Diffuse low-level muscular T2 hyperintensity and enhancement, most consistent with diabetic myopathy. LEFT FOOT BONE Methicillin resistant staphylococcus aureus Staphylococcus lugdunensis MIC MIC CIPROFLOXACIN >=8 RESISTANT Resistant <=0.5 SENSI... Sensitive CLINDAMYCIN <=0.25 SENS... Sensitive >=8 RESISTANT Resistant ERYTHROMYCIN >=8 RESISTANT Resistant >=8 RESISTANT Resistant GENTAMICIN <=0.5 SENSI... Sensitive <=0.5 SENSI... Sensitive Inducible Clindamycin NEGATIVE Sensitive NEGATIVE Sensitive OXACILLIN >=4 RESISTANT Resistant 2 SENSITIVE Sensitive RIFAMPIN <=0.5 SENSI... Sensitive <=0.5 SENSI... Sensitive TETRACYCLINE <=1 SENSITIVE Sensitive <=1 SENSITIVE Sensitive  TRIMETH/SULFA <=10 SENSIT Sensitive <=10 SENSIT Sensitive ... Marland Kitchen.. VANCOMYCIN 1 SENSITIVE Sensitive <=0.5 SENSI... Sensitive Right foot bone . Component 3 wk ago Specimen Description BONE Special Requests RIGHT 4 METATARSAL SAMPLE B Gram Stain NO WBC SEEN NO ORGANISMS SEEN Culture RARE METHICILLIN RESISTANT STAPHYLOCOCCUS AUREUS NO ANAEROBES ISOLATED Performed at [facility], [address], [place], [place] [address] Report Status 10/08/2019 FINAL Organism ID, Bacteria METHICILLIN RESISTANT  STAPHYLOCOCCUS AUREUS [place], [name] ([id number]) [id number] Page 4 of 21 Resulting Agency [facility] Susceptibility Methicillin resistant staphylococcus aureus MIC CIPROFLOXACIN >=8 RESISTANT Resistant CLINDAMYCIN <=0.25 SENS... Sensitive ERYTHROMYCIN >=8 RESISTANT Resistant GENTAMICIN <=0.5 SENSI... Sensitive Inducible Clindamycin NEGATIVE Sensitive OXACILLIN >=4 RESISTANT Resistant RIFAMPIN <=0.5 SENSI... Sensitive TETRACYCLINE <=1 SENSITIVE Sensitive TRIMETH/SULFA <=10 SENSIT Sensitive ... VANCOMYCIN 1 SENSITIVE Sensitive This is a patient we had in clinic earlier this year with a wound over his left fifth metatarsal head. 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 Victorino Dike on 05/16/2019. He seems to have developed problems with wounds on his bilateral feet in June 2021 although he may have had problems earlier than this. He was in an urgent care with a right foot ulcer on 09/26/2019 and given a course of doxycycline. This was apparently after having trouble getting into see orthopedics. He was seen by podiatry on 09/28/2019 noted to have bilateral lower extremity ulcers including the left lateral fifth metatarsal base and the right subfifth met head. It was noted that had purulent drainage at that time. He required hospitalization from 6/20 through 7/2. This was because of worsening right foot wounds. He underwent bilateral operative incision and drainage and bone biopsies bilaterally. Culture results are listed above. He has been referred back to clinic by Dr. Ardelle Anton of podiatry. He is also followed by Dr. Orvan Falconer who saw him yesterday. He was discharged from hospital on Zyvox  Flagyl and Levaquin and yesterday changed to doxycycline Flagyl and Levaquin. His inflammatory markers on 6/26 showed a sedimentation rate of 129 and a C-reactive protein of 5. This is improved to 14 and 1.3 respectively. This would indicate improvement. ABIs in our clinic today were 1.23 on the right and 1.20 on the left 11/01/2019 on evaluation today patient appears to be doing fairly well in regard to the wounds on his feet at this point. Fortunately there is no signs of active infection at this time. No fevers, chills, nausea, vomiting, or diarrhea. He currently is seeing infectious disease and still under their care at this point. Subsequently he also has both wounds which she has not been using collagen on as he did not receive that in his packaging he did not call us and let us know that. Apparently that just was missed on the order. Nonetheless we will get that straightened out today. 8/9-Patient returns for bilateral foot wounds, using Prisma with hydrogel moistened dressings, and the wounds appear stable. Patient using surgical shoes, avoiding much pressure or weightbearing as much as possible 8/16; patient has bilateral foot wounds. 1 on the right lateral foot proximally the other is on the left mid lateral foot. Both required debridement of callus and thick skin around the wounds. We have been using silver collagen 8/27; patient has bilateral lateral foot wounds. The area on the  left substantially surrounded by callus and dry skin. This was removed from the wound edge. The underlying wound is small. The area on the right measured somewhat smaller today. 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 Unfortunately I did not update his antibiotics during today's visit. 9/10 I reviewed Dr. Blair Dolphin last notes he felt he had completed antibiotics his inflammatory markers were reasonably well controlled. He has a small wound on  the lateral left foot and a tiny area on the right which is just above closed. He is using Hydrofera Blue with border foam he has bilateral surgical shoes 9/24; 2 week f/u. doing well. right foot is closed. left foot still undermined. 10/14; right foot remains closed at the fifth met head. The area over the base of the left fifth metatarsal has a small open area but considerable undermining towards the plantar foot. Thick callus skin around this suggests an adequate pressure relief. We have talked about this. He says he is going to go back into his cam boot. I suggested a total contact cast he did not seem enamored with this suggestion 10/26; left foot base of the fifth metatarsal. Same condition as last time. He has skin over the area with an open wound however the skin is not adherent. He went to see Dr. Loreta Ave who did an x-ray and culture of his foot I have not reviewed the x-ray but the patient was not told anything. He is on doxycycline 11/11; since the patient was last here he was in the emergency room on 10/30 he was concerned about swelling in the left foot. They did not do any cultures or x-rays. They changed his antibiotics to cephalexin. Previous culture showed group B strep. The cephalexin is appropriate as doxycycline has less than predictable coverage. Arrives in clinic today with swelling over this area under the wound. He also has a new wound on the right fifth metatarsal head 11/18; the patient has a difficult wound on the lateral aspect of the left fifth metatarsal head. The wound was almost ballotable last week I opened it slightly expecting to see purulence however there was just bleeding. I cultured this this was negative. X-ray unchanged. We are trying to get an MRI but I am not sure were going to be able to get this through his insurance. He also has an area on the right lateral fifth metatarsal head this looks healthier 12/3; the patient finally got our MRI. Surprisingly this did  not show osteomyelitis. I did show the soft tissue ulceration at the lateral plantar aspect of the fifth metatarsal base with a tiny residual 6 mm abscess overlying the superficial fascia I have tried to culture this area I have not been able to get this to grow anything. Nevertheless the protruding tissue looks aggravated. I suspect we should try to treat the underlying "abscess with broad-spectrum antibiotics. I am going to start him on Levaquin and Flagyl. He has much less edema in his legs and I am going to continue to wrap his legs and see him weekly 12/10. I started Levaquin and Flagyl on him last week. He just picked up the Flagyl apparently there was some delay. The worry is the wound on the left fifth metatarsal base which is substantial and worsening. His foot looks like he inverts at the ankle making this a weightbearing surface. Certainly no improvement in fact I think the measurements of this are somewhat worse. We have  been using 12/17; he apparently just got the Levaquin yesterday this is 2 weeks after the fact. He has completed the Flagyl. The area over the left fifth metatarsal base still has protruding granulation tissue although it does not look quite as bad as it did some weeks ago. He has severe bilateral lymphedema although we have not been treating him for wounds on his legs this is definitely going to require compression. There was so much edema in the left I did not wish to put him in a total contact cast today. I am going to increase his compression from 3-4 layer. The area on the right lateral fifth met head actually look quite good and superficial. 12/23; patient arrived with callus on the right fifth met head and the substantial hyper granulated callused wound on the base of his fifth metatarsal. He says he is completing his Levaquin in 2 days but I do not think that adds up with what I gave him but I will have to double check this. We are using Hydrofera Blue on both  areas. My plan is to put the left leg in a cast the week after New Year's 04/06/2020; patient's wounds about the same. Right lateral fifth metatarsal head and left lateral foot over the base of the fifth metatarsal. There is undermining on the left lateral foot which I removed before application of total contact cast continuing with Hydrofera Blue new. Patient tells me he was seen by endocrinology today lab work was done [Dr. Kerr]. Also wondering whether he was referred to cardiology. I went over some lab work from previously does not have chronic renal failure certainly not nephrotic range proteinuria he does have very poorly controlled diabetes but this is not his most updated lab work. Hemoglobin A1c has been over 11 1/10; the patient had a considerable amount of leakage towards mid part of his left foot with macerated skin however the wound surface looks better the area on the right lateral fifth met head is better as well. I am going to change the dressing on the left foot under the total contact cast to silver alginate, continue with Hydrofera Blue on the right. 1/20; patient was in the total contact cast for 10 days. Considerable amount of drainage although the skin around the wound does not look too bad on the left foot. The area on the right fifth metatarsal head is closed. Our nursing staff reports large amount of drainage out of the left lateral foot wound 1/25; continues with copious amounts of drainage described by our intake staff. PCR culture I did last week showed E. coli and Enterococcus faecalis and low quantities. Multiple resistance genes documented including extended spectrum beta lactamase, MRSA, MRSE, quinolone, tetracycline. The wound is not quite as good this week as it was 5 days ago but about the same size 2/3; continues with copious amounts of malodorous drainage per our intake nurse. The PCR culture I did 2 weeks ago showed E. coli and low quantities of Enterococcus. There  were multiple resistance genes detected. I put Neosporin on him last week although this does not seem to have helped. The wound is [place], [name] ([id number]) [id number] Page 5 of 21 slightly deeper today. Offloading continues to be an issue here although with the amount of drainage she has a total contact cast is just not going to work 2/10; moderate amount of drainage. Patient reports he cannot get his stocking on over the dressing. I told him we have to do that  the nurse gave him suggestions on how to make this work. The wound is on the bottom and lateral part of his left foot. Is cultured predominantly grew low amounts of Enterococcus, E. coli and anaerobes. There were multiple resistance genes detected including extended spectrum beta lactamase, quinolone, tetracycline. I could not think of an easy oral combination to address this so for now I am going to do topical antibiotics provided by [facility] I think the main agents here are vancomycin and an aminoglycoside. We have to be able to give him access to the wounds to get the topical antibiotic on 2/17; moderate amount of drainage this is unchanged. He has his Keystone topical antibiotic against the deep tissue culture organisms. He has been using this and changing the dressing daily. Silver alginate on the wound surface. 2/24; using Keystone antibiotic with silver alginate on the top. He had too much drainage for a total contact cast at one point although I think that is improving and I think in the next week or 2 it might be possible to replace a total contact cast I did not do this today. In general the wound surface looks healthy however he continues to have thick rims of skin and subcutaneous tissue around the wide area of the circumference which I debrided 06/04/2020 upon evaluation today patient appears to be doing well in regard to his wound. I do feel like he is showing signs of improvement. There is little bit  of callus and dead tissue around the edges of the wound as well as what appears to be a little bit of a sinus tract that is off to the side laterally I would perform debridement to clear that away today. 3/17; left lateral foot. The wound looks about the same as I remember. Not much depth surface looks healthy. No evidence of infection 3/25; left lateral foot. Wound surface looks about the same. Separating epithelium from the circumference. There really is no evidence of infection here however not making progress by my view 3/29; left lateral foot. Surface of the wound again looks reasonably healthy still thick skin and subcutaneous tissue around the wound margins. There is no evidence of infection. One of the concerns being brought up by the nurses has again the amount of drainage vis--vis continued use of a total contact cast 4/5; left lateral foot at roughly the base of the fifth metatarsal. Nice healthy looking granulated tissue with rims of epithelialization. The overall wound measurements are not any better but the tissue looks healthy. The only concern is the amount of drainage although he has no surrounding maceration with what we have been doing recently to absorb fluid and protect his skin. He also has lymphedema. 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 He states that the kerlix/coban under the TCC rolled down and could not get it back up. He has been using an offloading scooter and has somehow hurt his right foot using this device. This happened last week. He states that the side of his right foot developed a blister and opened. The top of his foot also has a few small open wounds he thinks is due to his socks rubbing in his shoes. He  has not been using any dressings  to the wound. He denies purulent drainage, fever/chills or erythema to the wounds. 4/22; patient presents for 1 week follow-up. He developed new wounds to the right foot that were evaluated at last clinic visit. He continues to have a total contact cast to the left leg and he reports no issues. He has been using silver collagen to the right foot wounds with no issues. He denies purulent drainage, fever/chills or erythema to the right foot wounds. He has no complaints today 4/25; patient presents for 1 week follow-up. He has a total contact cast of the left leg and reports no issues. He has been using silver alginate to the right foot wound. He denies purulent drainage, fever/chills or erythema to the right foot wounds. 5/2 patient presents for 1 week follow-up. T contact cast on the left. The wound which is on the base of the plantar foot at the base of the fifth metatarsal otal actually looks quite good and dimensions continue to gradually contract. HOWEVER the area on the right lateral fifth metatarsal head is much larger than what I remember from 2 weeks ago. Once more is he has significant levels of hypergranulation. Noteworthy that he had this same hyper granulated response on his wound on the left foot at one point in time. So much so that he I thought there was an underlying fluid collection. Based on this I think this just needs debridement. 5/9; the wound on the left actually continues to be gradually smaller with a healthy surface. Slight amount of drainage and maceration of the skin around but not too bad. However he has a large wound over the right fifth metatarsal head very much in the same configuration as his left foot wound was initially. I used silver nitrate to address the hyper granulated tissue no mechanical debridement 5/16; area on the left foot did not look as healthy this week deeper thick surrounding macerated skin and subcutaneous  tissue. The area on the right foot fifth met head was about the same The area on the right ankle that we identified last week is completely broken down into an open wound presumably a stocking rubbing issue 5/23; patient has been using a total contact cast to the left side. He has been using silver alginate underneath. He has also been using silver alginate to the right foot wounds. He has no complaints today. He denies any signs of infection. 5/31; the left-sided wound looks some better measure smaller surface granulation looks better. We have been using silver alginate under the total contact cast The large area on his right fifth met head and right dorsal foot look about the same still using silver alginate 6/6; neither side is good as I was hoping although the surface area dimensions are better. A lot of maceration on his left and right foot around the wound edge. Area on the dorsal right foot looks better. He says he was traveling. I am not sure what does the amount of maceration around the plantar wounds may be drainage issues 6/13; in general the wound surfaces look quite good on both sides. Macerated skin and raised edges around the wound required debridement although in general especially on the left the surface area seems improved. The area on the right dorsal ankle is about the same I thought this would not be such a problem to close 6/20; not much change in either wound although the one on the right looks a little better. Both wounds have thick macerated edges to the skin  requiring debridements. We have been using silver alginate. The area on the dorsal right ankle is still open I thought this would be closed. 6/28; patient comes in today with a marked deterioration in the right foot wound fifth met head. Wide area of exposed bone this is a drastic change from last time. The area on the left there we have been casting is stagnant. We have been using silver alginate in both wound  areas. 7/5; bone culture I did for PCR last time was positive for Pseudomonas, group B strep, Enterococcus and Staph aureus. There was no suggestion of methicillin resistance or ampicillin resistant genes. This was resistant to tetracycline however He comes into the clinic today with the area over his right plantar fifth metatarsal head which had been doing so well 2 weeks ago completely necrotic feeling bone. I do not know that this is going to be salvageable. The left foot wound is certainly no smaller but it has a better surface and is superficial. 7/8; patient called in this morning to say that his total contact cast was rubbing against his foot. He states he is doing fine overall. He denies signs of infection. 7/12; continued deterioration in the wound over the right fifth metatarsal head crumbling bone. This is not going to be salvageable. The patient agrees and wants to be referred to Dr. Doran Durand which we will attempt to arrange as soon as possible. I am going to continue him on antibiotics as long as that takes so I will renew those today. The area on the left foot which is the base of the fifth metatarsal continues to look somewhat better. Healthy looking tissue no depth no debridement is necessary here. 7/20; the patient was kindly seen by Dr. Doran Durand of orthopedics on 10/19/2020. 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 Towards this end we have taken him out of the total contact cast on the left we will put him in a wrap with Hydrofera Blue. As I understand things surgery is planned for 7/21 7/27; patient had his surgery last Thursday. 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 Apparently everything went well we did not still disturb that today The area on the left foot actually looks quite good. He has been much less mobile which probably explains this he did not seem to do well in the total  contact cast secondary to drainage and maceration I think. We have been using [facility], [name] ([id number]) [id number] Page 6 of 21 11/09/2020 upon evaluation today patient appears to be doing well with regard to his plantar foot ulcer on the left foot. Fortunately there is no evidence of active infection at this time. No fevers, chills, nausea, vomiting, or diarrhea. Overall I think that he is actually doing extremely well. Nonetheless I do believe that he is staying off of this more following the surgery in his right foot that is the reason the left is doing so great. 8/16; left plantar foot wound. This looks smaller than the last time I saw this he is using Hydrofera Blue. The surgical wound on the right foot is being followed by Dr. Doran Durand we did not look at this today. He has surgical shoes on both feet 8/23; left plantar foot wound not as good this week. Surrounding macerated skin and subcutaneous tissue everything looks moist and wet. I do not think he is offloading this  adequately. He is using a surgical shoe Apparently the right foot surgical wound is not open although I did not check his foot 8/31; left plantar foot lateral aspect. Much improved this week. He has no maceration. Some improvement in the surface area of the wound but most impressively the depth is come in we are using silver alginate. The patient is a Veterinary surgeon. He is asked that we write him a letter so he can go back to work. I have also tried to see if we can write something that will allow him to limit the amount of time that he is on his foot at work. 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 Hopefully the latter can be avoided 9/6; I believe the patient missed an appointment last week. He arrives in today with a wound looking roughly the same certainly no better. Undermining laterally and  also inferiorly. We used molecuLight today in training with the patient's permission.. We are using silver alginate 9/21 wound is measuring bigger this week although this may have to do with the aggressive circumferential debridement last week in response to the blush fluorescence on the MolecuLight. Culture I did last week showed significant MSSA and E. coli. I put him on Augmentin but he has not started it yet. We are also going to send this for compounded antibiotics at [facility]. There is no evidence of systemic infection 9/29; silver alginate. His Keystone arrived. He is completing Augmentin in 2 days. Offloading in a cam boot. Moderate drainage per our intake staff 10/5; using silver alginate. He has been using his [place]. He has completed his Augmentin. Per our intake nurse still a lot of drainage, far too much to consider a total contact cast. Wound measures about the same. He had the same undermining area that I defined last week from a roughly 11-3. I remove this today 10/12; using silver alginate he is using the [place]. He comes in for a nurse visit hence we are applying Jodie Echevaria twice a week. Measuring slightly better today and less notable drainage. Extensive debridement of the wound edge last time 10/18; using topical Keystone and silver alginate and a soft cast. Wound measurements about the same. Drainage was through his soft cast. We are changing this twice a week Tuesdays and Friday 10/25; comes in with moderate drainage. Still using Keystone silver alginate and a soft cast. Wound dimensions completely the same.He has a lot of edema in the left leg he has lymphedema. Asking for [name] to consider wrapping him as he cannot get his stocking on over the soft cast 11/2; comes in with moderate to large drainage slightly smaller in terms of width we have been using [place]. His wound looks satisfactory but not much improvement 11/4; patient presents today for obligatory cast change. Has no  issues or complaints today. He denies signs of infection. 11/9; patient traveled this weekend to [place], was on the cast quite a bit. Staining of the cast with black material from his walking boot. Drainage was not quite as bad as we feared. Using silver alginate and Keystone 11/16; we do not have size for cast therefore we have been putting a soft cast on him since the change on Friday. Still a significant amount of drainage necessitating changing twice a week. We have been using the Keystone at cast changes either hard or soft as well as silver alginate Comes in the  clinic with things actually looking fairly good improvement in width. He says his offloading is about the same 02/24/2021 upon evaluation today patient actually comes back in and is doing excellent in regard to his foot ulcer this is significantly smaller even compared to the last visit. The soft cast seems to have done extremely well for him which is great news. I do not see any signs of infection minimal debridement will be needed today. 11/30; left lateral foot much improved half a centimeter improvement in surface area. No evidence of infection. He seems to be doing better with the soft cast in the TCC therefore we will continue with this. He comes back in later in the week for a change with the nurses. This is due to drainage 12/6; no improvement in dimensions. Under illumination some debris on the surface we have been using silver alginate, soft cast. If there is anything optimistic here he seems to have have less drainage 12/13. Dimensions are improved both length and width and slightly in depth. Appears to be quite healthy today. Raised edges of this thick skin and callus around the edges however. He is in a soft cast were bringing him back once for a change on Friday. Drainage is better 12/20. Dimensions are improved. He still has raised edges of thick skin and callus around the edges. We are using a soft cast 12/28; comes in today  with thick callus around the wound. Using silver under alginate under a soft cast. I do not think there is much improvement in any measurement 2023 04/06/2021; patient was put in a total contact cast. Unfortunately not much change in surface area 1/10; not much different still thick callus and skin around the edge in spite of the total contact cast. This was just debrided last week we have been using the [facility] compounded antibiotic and silver alginate under a total contact cast 1/18 the patient's wound on the left side is doing nicely. smaller HOWEVER he comes in today with a wound on the right foot laterally. blister most likely serosangquenous drainage 1/24; the patient continues to do well in terms of the plantar left foot which is continued to contract using silver alginate under the total contact cast HOWEVER the right lateral foot is bigger with denuded skin around the edges. I used pickups and a #15 scalpel to remove this this looks like the remanence of a large blister. Cannot rule out infection. Culture in this area I did last week showed Staphylococcus lugdunensis few colonies. I am going to try to address this with his Jodie Echevaria antibiotic that is done so well on the left having linezolid and this should cover the staph 2/1; the patient's wound on his left foot which was the original plantar foot wound thick skin and eschar around the edges even in the total contact cast but the wound surface does not look too bad The real problem is on how his right lateral foot at roughly the base of the fifth metatarsal. The wound is completely necrotic more worrisome than that there is swelling around the edges of this. We have been using silver alginate on both wounds and Keystone on the right foot. Unfortunately I think he is going to require systemic antibiotics while we await cultures. He did not get the x-ray done that we ordered last week [lost the prescription 2/7; disappointingly in the area  on the left foot which we are treating with a total contact cast is still not closed although it is  much smaller. He continues to have a lot of callus around the wound edge. -Right lateral foot culture I did last week was negative x-ray also negative for osteomyelitis. 2/15: TCC silver alginate on the left and silver alginate on the right lateral. No real improvement in either area 05/26/2021: T oday, the wounds are roughly the same size as at his previous visit, post-debridement. He continues to endorse fairly substantial drainage, particularly on the right. He has been in a total contact cast on the left. There is still some callus surrounding this lesion. On the right, the periwound skin is quite macerated, along with surrounding callus. The center of the right-sided wound also has some dark, densely adherent material, which is very difficult to remove. 06/02/2021: Today, both wounds are slightly smaller. He has been using zinc oxide ointment around the right ulcer and the degree of maceration has improved markedly. There continues to be an area of nonviable tissue in the center of the right sided ulcer. The left-sided wound, which has been in the total contact [place], [name] ([id number]) [id number] Page 7 of 21 cast. Appears clean and the degree of callus around it is less than previously. 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 He had to take the stairs and both wounds have increased in size. The left foot, which has been in a total contact cast, has developed a tunnel tracking to the lateral aspect of his foot. The nonviable tissue in the center of the right-sided ulcer remains recalcitrant to debridement. There is significant undermining surrounding the entirety of the left sided wound. 06/16/2021: The elevator at school has been fixed and the patient has been able to avoid putting as much weight on his  wounds over the past week. We converted the left foot wound into a single lesion today, but despite this, the wound is actually smaller. The base is healthy with limited periwound callus. On the right, the central necrotic area is still present. He continues to be quite macerated around the right sided wound, despite applying barrier cream. This does, however, have the benefit of softening the callus to make it more easily removable. 06/23/2021: Today, the left wound is smaller. The lateral aspect that had opened up previously is now closed. The wound base has a healthy bed of granulation tissue and minimal slough. Unfortunately, on the right, the wound is larger and continues to be fairly macerated. He has also reopened the wound at his right ankle. He thinks this is due to the gait he has adopted secondary to his total contact cast and boot. 06/30/2021: T oday, both wounds are a little bit larger. The lateral aspect on the left has remained closed. He continues to have significant periwound maceration. The culture that I took from the right sided wound grew a population of bacteria that is not covered by his current [facility] antibiotic. The center of the right- sided wound continues to appear necrotic with nonviable fat. It probes deeper today, but does not reach bone. 07/07/2021: The periwound maceration is a little bit less today. The right lateral foot wound has some areas that appear more viable and the necrotic center also looks a little bit better. The wound on the dorsal surface of his right foot near the ankle is contracting and the surface appears healthy. The left plantar wound surface looks healthy, but there is some new undermining on the medial portion. He did get his new Keystone antibiotic  and began applying that to the right foot wound on Saturday. 07/14/2021: The intake nurse reported substantial drainage from his wounds, but the periwound skin actually looks better than is typical for  him. The wound on the dorsal surface of his right foot near the ankle is smaller and just has a small open area underneath some dried eschar. The left plantar wound surface looks healthy and there has been no significant accumulation of callus. The right lateral foot wound looks quite a bit better, with the central portion, which typically appears necrotic, looking more viable albeit pale. 07/22/2021: His left foot is extremely macerated today. The wound is about the same size. The wound on the dorsal surface of his right foot near the ankle had closed, but he traumatized it removing the dressing and there is a tiny skin tear in that location. The right lateral foot wound is bigger, but the surface appears healthy. 07/30/2021: The wound on the dorsal surface of his right foot near the ankle is closed. The right lateral foot wound again is a little bit bigger due to some undermining. The periwound skin is in better condition, however. He has been applying zinc oxide. The wound surface is a little bit dry today. On the left, he does not have the substantial maceration that we frequently see. The wound itself is smaller and has a clean surface. 08/06/2021: Both wounds seem to have deteriorated over the past week. The right lateral foot wound has a dry surface but the periwound is boggy.. Overall wound dimensions are about the same. On the left, the wound is about the same size, but there is more undermining present underneath periwound callus. 08/13/2021: The right sided wound looks about the same, but on the left there has been substantial deterioration. The undermining continues to extend under periwound callus. Once this was removed, substantial extension of the wound was present. There is no odor or purulent drainage but clearly the wounds have broken down. 08/20/2021: The wounds look about the same today. He has been out of his total contact cast and has just been changing the dressings using topical  Keystone with PolyMem Ag, Kerlix and Ace bandages. The wound on the top of his right ankle has reopened but this is quite small. There was a little bit of purulent material that I expressed when examining this wound. 08/24/2021: After the aggressive debridement I performed at his last visit, the wounds actually look a little bit better today. They are smaller with the exception of the wound on the top of his right ankle which is a little bit bigger as some more skin pulled off when he was changing his dressing. We are using topical Keystone with PolyMem Ag Kerlix and Ace bandages. 09/02/2021: There has been really no change to any of his wounds. 09/16/2021: The patient was hospitalized last week with nausea, vomiting, and dehydration. He says that while he was in the hospital, his wounds were not really addressed properly. T oday, both plantar foot wounds are larger and the periwound skin is macerated. The wound on the dorsum of his right foot has a scab on the top. The right foot now has a crater where previously he had had nonviable fat. It looks as though this simply died and fell out. The periwound callus is wet. 09/24/2021: His wounds have deteriorated somewhat since his last visit. The wound on the dorsum of his right foot near his ankle is larger and has more nonviable tissue present. The crater  in his right foot is even deeper; I cannot quite palpate or probe to bone but I am sure it is close. The wound on his left plantar foot has an odd boggy area in the center that almost feels as though it has fluid within it. He has run out of his topical Keystone antibiotic. We are using silver alginate on his wounds. 09/29/2021: He has developed a new wound on the dorsum of his left foot near his ankle. He says he thinks his wrapping is rubbing in that site. I would concur with this as the wound on his right ankle is larger. The left foot looks about the same. The right foot has the crater that was present  last week. No significant slough accumulation, but his foot remains quite swollen and warm despite oral antibiotic therapy. 10/08/2021: All of his wounds look about the same as last week. He did not start his oral antibiotics that are prescribed until just a couple of days ago; his Jodie Echevaria compounded antibiotics formula has been changed and he is awaiting delivery of the new recipe. His MRI that was scheduled for earlier this week was canceled as no prior authorization had been obtained; unfortunately the tech responsible sent an email to my old [place] email, which I no longer use nor have access to. 10/18/2021: The wounds on his bilateral dorsal feet near the ankles are both improved. They are smaller and have just some eschar and slough buildup. The left plantar wound has a fair amount of undermining, but the surface is clean. There is some periwound callus accumulation. On the right plantar foot, there is nonviable fat leading to a deep tunnel that tracks towards his dorsal medial foot. There is periwound callus and slough accumulation, as well. His right foot and leg remain swollen as compared to the left. 10/25/2021: The wounds on his bilateral dorsal feet and at the ankles have broken down somewhat. They are little bit larger than last week. The left plantar wound continues to undermine laterally but the surface is clean. The right plantar foot wound shows some decreased depth in the tunnel tracking towards his dorsal medial foot. He has not yet had the Doppler study that I ordered; it sounds like there is some confusion about the scheduling of the procedure. In addition, the MRI was denied and I have taken steps to appeal the denial. 11/24/2021: Since our last visit, Mr. Ceci was admitted to the hospital where an MRI suggested osteomyelitis. He was taken the operating room by podiatry. Bone biopsies were negative for osteomyelitis. They debrided his wounds and applied myriad matrix. He  saw them last week and they removed his staples. He is here today to continue his wound healing process. T oday, both of the dorsal foot/ankle wounds are substantially smaller. There is just a little eschar overlying the left sided wound and some eschar and slough on the right. The right plantar foot ulcer has the healthiest surface of granulation tissue that I have seen to date. A portion of the myriad matrix failed to take and was hanging loose. It appears that myriad morcells were placed into the tunnel closest to the dorsal portion of his foot. These have sloughed off. The left plantar foot ulcer is about the same size, but has a much healthier surface than in the past. Both plantar ulcers have callus and slough accumulation. 12/07/2021: Left dorsal foot/ankle wound is closed. The right dorsal foot/ankle wound is nearly closed and just has a small  open area with some eschar and slough. The right plantar foot wound has contracted quite a bit since our last visit. It has a healthy surface with just a little bit of slough accumulation and [place], [name] ([id number]) [id number] Page 8 of 21 periwound callus buildup. The left plantar foot wound is about the same size but the surface appears healthy. There is a little slough and periwound callus on this side, as well. 12/15/2021: Both dorsal foot/ankle wounds are closed. The right plantar foot wound is substantially smaller than at our last visit. The tunneling that was present has nearly closed. There is just a little bit of slough buildup. The left plantar foot wound is also a little bit smaller today. The surface is the healthiest that I have ever seen it. Light slough and periwound callus accumulation on this side. 12/22/2021: The dorsal ankle/foot wounds remain closed. The right plantar foot wound continues to contract. There is still a bit of depth at the lateral portion of the wound but the surface has a good granulated  appearance. The wound on the left is about the same size, the central indented portion is still adherent. It also is clean with good granulation tissue present. The periwound skin and callus are a bit macerated, however. 12/29/2021: Both ankle wounds remain closed. The right plantar foot wound is less than half the size that it was last week. There is no depth any longer and the surface has just a little bit of slough and periwound callus. On the left, the wound is not much smaller, but the surface is healthier with good granulation tissue. There is also a little slough and periwound callus buildup. 01/05/2022: The right plantar foot wound continues to contract and is once again about half the size as it was last week. There is just a little bit of surface slough and periwound callus. On the left, the depth of the wound has decreased and the diameter has started to contract. It is also very clean with just a little slough and biofilm present. 01/12/2022: The right plantar foot wound is smaller again today. There is just some periwound callus and slough accumulation. On the left, there is a fair amount of undermining and the surface is a little boggy, but no other significant change. 01/19/2022: The right plantar wound continues to contract. There is minimal periwound callus accumulation and just a light layer of slough on the surface. On the left, the surface looks better, but there is fairly significant undermining near the 11:00 portion of the wound, aiming towards his great toe. The skin overlying this area is very healthy and has a good fat layer present. No malodor or purulent drainage. 01/26/2022: The right sided wound continues to contract and has just a light layer of slough on the surface. Minimal periwound callus. On the left, he still has substantial undermining and the tissue surface is not as robust as I would like to see. No malodor or purulent drainage, but he did say he had a lot of  serous drainage over the weekend. 02/02/2022: The right foot wound is about a quarter of the size that it was at his last visit. There is just a little slough on the surface and some periwound callus. On the left, the undermining persists and the tissue is still more pale, but he does not seem to have had as much drainage over the last weekend. We are waiting on a wound VAC. 02/09/2022: His right foot has healed. He received the  wound VAC, but did not bring it to clinic with him today. The lateral aspect of the left foot wound has come in a little, but he still has substantial undermining on the medial and distal portion of the wound. Still with macerated periwound callus. 02/16/2022: The wound VAC was applied last Friday. T oday, there has been tremendous improvement in the surface of the wound bed. The undermined portion of the wound has come in a bit. There is still some overhanging dead skin. Overall the wound looks much better. 02/23/2022: No significant change in the overall wound dimensions, but the wound surface continues to improve and appears healthier and more robust. There is some periwound callus accumulation and overhanging skin. No concern for infection. 03/02/2022: Although the measurements taken in clinic today were unchanged, the visual appearance of the wound looks like it is smaller and the undermining/tunneling is filling with flesh. There is less periwound tissue maceration than usual. No malodor or purulent drainage. No concern for infection. 03/09/2022: The undermining has come in by couple of millimeters. The periwound is a little bit more macerated this week. No concern for infection. 12/13; substantial wound on the left plantar foot. We are using silver collagen and a wound VAC. 03/23/2022: The undermining continues to contract. The wound surface is a little bit dry. 03/30/2022: The undermining has essentially resolved. There is still some depth at the center of the wound. The  wound surface has good beefy tissue and the moisture balance is better. 04/06/2022: The wound dimensions are about the same, except that the depth is beginning to fill in. He has had more drainage this week, but there is no obvious concern for infection. 04/13/2022: He has built up a little bit of callus around the edges of the wound, creating some undermining. Otherwise the wound looks fairly unchanged. 04/20/2022: The wound bed tissue looks very healthy and robust. Light biofilm on the surface. There is some built up wet callus around the wound edges, but no undermining. Electronic Signature(s) Signed: 04/20/2022 9:54:38 AM By: Duanne Guess MD FACS Entered By: Duanne Guess on 04/20/2022 09:54:38 -------------------------------------------------------------------------------- Physical Exam Details Patient Name: Date of Service: [name], [name] 04/20/2022 8:45 A M Medical Record Number: [id number] Patient Account Number: [id number] Date of Birth/Sex: Treating RN: 1985-04-19 (36 y.o. M) Primary Care Provider: [name] Other Clinician: Referring Provider: Treating Provider/Extender: [name] in Treatment: 129 [place], [name] ([id number]) [id number] Page 9 of 21 Hypertensive, asymptomatic. . . . no acute distress. Respiratory Normal work of breathing on room air. Notes 04/20/2022: The wound bed tissue looks very healthy and robust. Light biofilm on the surface. There is some built up wet callus around the wound edges, but no undermining. Electronic Signature(s) Signed: 04/20/2022 9:55:11 AM By: Duanne Guess MD FACS Entered By: Duanne Guess on 04/20/2022 09:55:11 -------------------------------------------------------------------------------- Physician Orders Details Patient Name: Date of Service: [name], [name] 04/20/2022 8:45 A M Medical Record Number: [id number] Patient Account Number:  [id number] Date of Birth/Sex: Treating RN: 1985/05/01 (36 y.o. 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 - Dr. Lady Gary - Room 1 with Bonita Quin Return A Wednesday 1/24 @ 0845 am Bathing/  Shower/ Hygiene May shower and wash wound with soap and water. Negative Presssure Wound Therapy Wound #3 Left,Lateral,Plantar Foot Wound Vac to wound continuously at 194mm/hg pressure Black Foam Edema Control - Lymphedema / SCD / Other Bilateral Lower Extremities Avoid standing for long periods of time. Exercise regularly Compression stocking or Garment 20-30 mm/Hg pressure to: - to both legs daily Off-Loading Open toe surgical shoe to: - Both feet Additional Orders / Instructions Follow Nutritious Diet Non Wound Condition pply the following to affected area as directed: - continue to apply foam donut or cushion to healed right plantar foot A Wound Treatment Wound #3 - Foot Wound Laterality: Plantar, Left, Lateral Cleanser: Soap and Water 3 x Per Week/30 Days Discharge Instructions: May shower and wash wound with dial antibacterial soap and water prior to dressing change. Peri-Wound Care: Skin Prep (DME) (Generic) 3 x Per Week/30 Days Discharge Instructions: Use skin prep as directed Prim Dressing: Promogran Prisma Matrix, 4.34 (sq in) (silver collagen) (DME) (Generic) 3 x Per Week/30 Days ary Discharge Instructions: Moisten collagen with saline or hydrogel [place], [name] ([id number]) [id number] Page 10 of 21 Prim Dressing: VAC ary 3 x Per Week/30 Days Secondary Dressing:  Zetuvit Plus 4x8 in (DME) (Generic) 3 x Per Week/30 Days Discharge Instructions: Apply over primary dressing as directed. Secured With: Elastic Bandage 4 inch (ACE bandage) (DME) (Generic) 3 x Per Week/30 Days Discharge Instructions: Secure with ACE bandage as directed. Secured With: 84M Medipore Scientist, research (life sciences) Surgical T 2x10 (in/yd) (Generic) 3 x Per Week/30 Days ape Discharge Instructions: Secure with tape as directed. Compression Wrap: Kerlix Roll 4.5x3.1 (in/yd) (DME) (Generic) 3 x Per Week/30 Days Discharge Instructions: Apply Kerlix and Coban compression as directed. Electronic Signature(s) Signed: 04/20/2022 10:14:00 AM By: Duanne Guess MD FACS Entered By: Duanne Guess on 04/20/2022 09:55:52 -------------------------------------------------------------------------------- Problem List Details Patient Name: Date of Service: [name], [name] 04/20/2022 8:45 A M Medical Record Number: [id number] Patient Account Number: [id number] Date of Birth/Sex: Treating RN: [birth year] (36 y.o. [name] Primary Care Provider: [name] Other Clinician: Referring Provider: Treating Provider/Extender: [name] in Treatment: 129 Active Problems ICD-10 Encounter Code Description Active Date MDM Diagnosis L97.528 Non-pressure chronic ulcer of other part of left foot with other specified 10/24/2019 No Yes severity E11.621 Type 2 diabetes mellitus with foot ulcer 10/24/2019 No Yes Inactive Problems ICD-10 Code Description Active Date Inactive Date L97.518 Non-pressure chronic ulcer of other part of right foot with other specified severity 07/14/2020 07/14/2020 L97.518 Non-pressure chronic ulcer of other part of right foot with other specified severity 10/24/2019 10/24/2019 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, right ankle and foot 10/24/2019 10/24/2019 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 08/10/2020 08/10/2020 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, left ankle and foot 10/24/2019 10/24/2019 L97.322 Non-pressure chronic ulcer of left ankle with fat layer exposed 09/29/2021 09/29/2021 [place], [name] ([id number]) [id number] Page 11 of 21 B95.62 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 10/24/2019 10/24/2019 classified elsewhere Resolved Problems Electronic Signature(s) Signed: 04/20/2022 9:51:03 AM By: Duanne Guess MD FACS Entered By: Duanne Guess on 04/20/2022 09:51:03 -------------------------------------------------------------------------------- Progress Note Details Patient Name: Date of Service: [name], [name] 04/20/2022 8:45 A M Medical Record Number: [id number] Patient Account Number: [id number] Date of Birth/Sex: Treating RN: [birth year] (36 y.o. 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 He comes in for review of a wound over the plantar fifth metatarsal head extending into the lateral part of the foot. He was followed for this previously by his podiatrist Dr. Darreld Mclean. As the patient tells his story he went to see podiatry first for a swelling he developed on the lateral part of his fifth metatarsal head in May. He states this was "open" by podiatry and the area closed. He was followed up in June and it was again opened callus removed and  it closed promptly. There were plans being made for surgery on the fifth metatarsal head in June however his blood sugar was apparently too high for anesthesia. Apparently the area was debrided and opened again in June and it is never closed since. Looking over the records from podiatry I am really not able to follow this. It was clear when he was first seen it was before 5/14 at that point he already had a wound. By 5/17 the ulcer was resolved. I do not see anything about a procedure. On 5/28 noted to have pre-ulcerative moderate keratosis. X-ray noted 1/5 contracted toe and tailor's bunion and metatarsal deformity. On a visit date on 09/28/2018 the dorsal part of the left foot it healed and resolved. There was concern about swelling in his lower extremity he was sent to the ER.. As far as I can tell he was seen in the ER on 7/12 with an ulcer on his left foot. A DVT rule out of the left leg was negative. I do not think I have complete records from podiatry but I am not able to verify the procedures this patient states he had. He states after the last procedure the wound has never closed although I am not able to follow this in the records I have from podiatry. He has not had a recent x-ray The patient has been using Neosporin on the wound. He is wearing a Darco shoe. He is still very active up on his foot working and exercising. Past medical history; type 2 diabetes ketosis-prone, leg swelling with a negative DVT study in July. Non-smoker ABI in our clinic was 0.85 on the left 10/16; substantial wound on the plantar left fifth met head extending laterally almost to the dorsal fifth MTP. We have been using silver alginate we gave him a Darco forefoot off loader. An x-ray did not show evidence of osteomyelitis did note soft tissue emphysema which I think was due to gas tracking through an open wound. There is no doubt in my mind he requires an MRI 10/23; MRI not booked until 3 November at the earliest  this is largely due to his glucose sensor in the right arm. We have been using silver alginate. There has been an improvement 10/29; I am still not exactly sure when his MRI is booked for. He says it is the third but it is the 10th in epic. This definitely needs to be done. He is running a low-grade fever today but no other symptoms. No real improvement in the 1 02/26/2019 patient presents today for a follow-up visit here in our clinic he is last been seen in the  clinic on October 29. Subsequently we were working on getting MRI to evaluate and see what exactly was going on and where we would need to go from the standpoint of whether or not he had osteomyelitis and again what treatments were going be required. Subsequently the patient ended up being admitted to the hospital on 02/07/2019 and was discharged on 02/14/2019. This is a somewhat interesting admission with a discharge diagnosis of pneumonia due to COVID-19 although he was positive for COVID-19 when tested at the urgent care but negative x2 when he was actually in the hospital. With that being said he did have acute respiratory failure with hypoxia and it was noted he also have a left foot ulceration with osteomyelitis. With that being said he did require oxygen for his pneumonia and I level 4 L. He was placed on antivirals and steroids for the COVID-19. He was also transferred to the [facility] campus at one point. Nonetheless he did subsequently discharged home and since being home has done much better in that regard. The CT angiogram did not show any pulmonary embolism. With regard to the osteomyelitis the patient was placed on vancomycin and Zosyn while in the hospital but has been changed to Augmentin at discharge. It was also recommended that he follow- up with wound care and podiatry. Podiatry however wanted him to see [name] according to the patient prior to them doing anything further. His hemoglobin A1c was 9.9 as noted in the hospital.  Have an MRI of the left foot performed while in the hospital on 02/04/2019. 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 There is an overlying plantar open wound noted an abscess tracking back along the lateral aspect of the fifth metatarsal shaft. There is otherwise diffuse cellulitis and mild fasciitis without findings of polymyositis. The patient did have recently pneumonia secondary to COVID-19 I looked in the chart through epic and it does appear that the patient may need to have an additional x-ray just to ensure everything is cleared and that he has no airspace disease prior to putting him into the chamber. 03/05/2019; patient was readmitted to the clinic last week. He was hospitalized twice for a viral upper respiratory tract infection from 11/1 through 11/4 and then [place], [name] ([id number]) [id number] Page 12 of 21 11/5 through 11/12 ultimately this turned out to be Covid pneumonitis. Although he was discharged on oxygen he is not using it. He says he feels fine. He has no exercise limitation no cough no sputum. His O2 sat in our clinic today was 100% on room air. 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 He received Vanco and Zosyn in the hospital and then was discharged on 2 weeks of Augmentin. I do not see any relevant cultures. He was supposed to follow-up with infectious disease but I do not see that he has an appointment. 12/8; patient saw Dr. Ronnie Derby of infectious disease last week. He felt that he had had adequate antibiotic therapy. He did not go to follow-up with Dr. Logan Bores of podiatry and I have again talked to him about the pros and cons of this. He does not want to consider a ray amputation of this time. He is aware of the risks of recurrence,  migration etc. He started HBO today and tolerated this well. He can complete  the Augmentin that I gave him last week. I have looked over the lab work that Dr. Effie Shy ordered his C-reactive protein was 3.3 and his sedimentation rate was 17. The C-reactive protein is never really been measurably that high in this patient 12/15; not much change in the wound today however he has undermining along the lateral part of the foot again more extensively than last week. He has some rims of epithelialization. We have been using silver alginate. He is undergoing hyperbarics but did not dive today 12/18; in for his obligatory first total contact cast change. Unfortunately there was pus coming from the undermining area around his fifth metatarsal head. This was cultured but will preclude reapplication of a cast. He is seen in conjunction with HBO 12/24; patient had staph lugdunensis in the wound in the undermining area laterally last time. We put him on doxycycline which should have covered this. The wound looks better today. I am going to give him another week of doxycycline before reattempting the total contact cast 12/31; the patient is completing antibiotics. Hemorrhagic debris in the distal part of the wound with some undermining distally. He also had hyper granulation. Extensive debridement with a #5 curette. The infected area that was on the lateral part of the fifth met head is closed over. I do not think he needs any more antibiotics. Patient was seen prior to HBO. Preparations for a total contact cast were made in the cast will be placed post hyperbarics 04/11/19; once again the patient arrives today without complaint. He had been in a cast all week noted that he had heavy drainage this week. This resulted in large raised areas of macerated tissue around the wound 1/14; wound bed looks better slightly smaller. Hydrofera Blue has been changing himself. He had a heavy drainage last week which caused a lot of  maceration around the wound so I took him out of a total contact cast he says the drainage is actually better this week He is seen today in conjunction with HBO 1/21; returns to clinic. He was up in [place] for a day or 2 attending a funeral. He comes back in with the wound larger and with a large area of exposed bone. He had osteomyelitis and septic arthritis of the fifth left metatarsal head while he was in hospital. He received IV antibiotics in the hospital for a prolonged period of time then 3 weeks of Augmentin. Subsequently I gave him 2 weeks of doxycycline for more superficial wound infection. When I saw this last week the wound was smaller the surface of the wound looks satisfactory. 1/28; patient missed hyperbarics today. Bone biopsy I did last time showed Enterococcus faecalis and Staphylococcus lugdunensis . He has a wide area of exposed bone. We are going to use silver alginate as of today. I had another ethical discussion with the patient. 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 In this situation I think the likelihood of healing this is low. Therefore I have recommended a ray amputation and with the patient's agreement I have referred him to Dr. Victorino Dike. The other issue is that his compliance with hyperbarics has been minimal because of his work schedule and given his underlying decision I am going to stop this today READMISSION 10/24/2019 MRI 09/29/2019 left foot IMPRESSION: 1. Apparent skin ulceration inferior and lateral to the 5th metatarsal base with underlying heterogeneous T2 signal and enhancement in the subcutaneous fat. Small peripherally enhancing fluid collections along the plantar and  lateral aspects of the 5th metatarsal base suspicious for abscesses. 2. Interval amputation through the mid 5th metatarsal with nonspecific low-level marrow edema and enhancement. Given the proximity to the adjacent soft tissue inflammatory  changes, osteomyelitis cannot be excluded. 3. The additional bones appear unremarkable. MRI 09/29/2019 right foot IMPRESSION: 1. Soft tissue ulceration lateral to the 5th MTP joint. There is low-level T2 hyperintensity within the 4th and 5th metatarsal heads and adjacent proximal phalanges without abnormal T1 signal or cortical destruction. These findings are nonspecific and could be seen with early marrow edema, hyperemia or early osteomyelitis. No evidence of septic joint. 2. Mild tenosynovitis and synovial enhancement associated with the extensor digitorum tendons at the level of the midfoot. 3. Diffuse low-level muscular T2 hyperintensity and enhancement, most consistent with diabetic myopathy. LEFT FOOT BONE Methicillin resistant staphylococcus aureus Staphylococcus lugdunensis MIC MIC CIPROFLOXACIN >=8 RESISTANT Resistant <=0.5 SENSI... Sensitive CLINDAMYCIN <=0.25 SENS... Sensitive >=8 RESISTANT Resistant ERYTHROMYCIN >=8 RESISTANT Resistant >=8 RESISTANT Resistant GENTAMICIN <=0.5 SENSI... Sensitive <=0.5 SENSI... Sensitive Inducible Clindamycin NEGATIVE Sensitive NEGATIVE Sensitive OXACILLIN >=4 RESISTANT Resistant 2 SENSITIVE Sensitive RIFAMPIN <=0.5 SENSI... Sensitive <=0.5 SENSI... Sensitive TETRACYCLINE <=1 SENSITIVE Sensitive <=1 SENSITIVE Sensitive TRIMETH/SULFA <=10 SENSIT Sensitive <=10 SENSIT Sensitive ... Marland Kitchen.. VANCOMYCIN 1 SENSITIVE Sensitive <=0.5 SENSI... Sensitive Right foot bone . Component 3 wk ago Specimen Description BONE Special Requests RIGHT 4 METATARSAL SAMPLE B Gram Stain NO WBC SEEN NO ORGANISMS SEEN [place], [name] ([id number]) [id number] Page 13 of 21 Culture RARE METHICILLIN RESISTANT STAPHYLOCOCCUS AUREUS NO ANAEROBES ISOLATED Performed at [facility], [address], [place], [place] [address] Report Status 10/08/2019 FINAL Organism ID, Bacteria METHICILLIN RESISTANT STAPHYLOCOCCUS AUREUS Resulting Agency  [facility] Susceptibility Methicillin resistant staphylococcus aureus MIC CIPROFLOXACIN >=8 RESISTANT Resistant CLINDAMYCIN <=0.25 SENS... Sensitive ERYTHROMYCIN >=8 RESISTANT Resistant GENTAMICIN <=0.5 SENSI... Sensitive Inducible Clindamycin NEGATIVE Sensitive OXACILLIN >=4 RESISTANT Resistant RIFAMPIN <=0.5 SENSI... Sensitive TETRACYCLINE <=1 SENSITIVE Sensitive TRIMETH/SULFA <=10 SENSIT Sensitive ... VANCOMYCIN 1 SENSITIVE Sensitive This is a patient we had in clinic earlier this year with a wound over his left fifth metatarsal head. 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 Victorino Dike on 05/16/2019. He seems to have developed problems with wounds on his bilateral feet in June 2021 although he may have had problems earlier than this. He was in an urgent care with a right foot ulcer on 09/26/2019 and given a course of doxycycline. This was apparently after having trouble getting into see orthopedics. He was seen by podiatry on 09/28/2019 noted to have bilateral lower extremity ulcers including the left lateral fifth metatarsal base and the right subfifth met head. It was noted that had purulent drainage at that time. He required hospitalization from 6/20 through 7/2. This was because of worsening right foot wounds. He underwent bilateral operative incision and drainage and bone biopsies bilaterally. Culture results are listed above. He has been referred back to clinic by Dr. Ardelle Anton of podiatry. He is also followed by Dr. Orvan Falconer who saw him yesterday. He was discharged from hospital on Zyvox Flagyl and Levaquin and yesterday changed to doxycycline Flagyl and Levaquin. His inflammatory markers on 6/26 showed a  sedimentation rate of 129 and a C-reactive protein of 5. This is improved to 14 and 1.3 respectively.  This would indicate improvement. ABIs in our clinic today were 1.23 on the right and 1.20 on the left 11/01/2019 on evaluation today patient appears to be doing fairly well in regard to the wounds on his feet at this point. Fortunately there is no signs of active infection at this time. No fevers, chills, nausea, vomiting, or diarrhea. He currently is seeing infectious disease and still under their care at this point. Subsequently he also has both wounds which she has not been using collagen on as he did not receive that in his packaging he did not call us and let us know that. Apparently that just was missed on the order. Nonetheless we will get that straightened out today. 8/9-Patient returns for bilateral foot wounds, using Prisma with hydrogel moistened dressings, and the wounds appear stable. Patient using surgical shoes, avoiding much pressure or weightbearing as much as possible 8/16; patient has bilateral foot wounds. 1 on the right lateral foot proximally the other is on the left mid lateral foot. Both required debridement of callus and thick skin around the wounds. We have been using silver collagen 8/27; patient has bilateral lateral foot wounds. The area on the left substantially surrounded by callus and dry skin. This was removed from the wound edge. The underlying wound is small. The area on the right measured somewhat smaller today. 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 Unfortunately I did not update his antibiotics during today's visit. 9/10 I reviewed Dr. Blair Dolphinampbell's last notes he felt he had completed antibiotics his inflammatory markers were reasonably well controlled. He has a small wound on the lateral left foot and a tiny area on the right which is just above closed. He is using Hydrofera Blue with border foam  he has bilateral surgical shoes 9/24; 2 week f/u. doing well. right foot is closed. left foot still undermined. 10/14; right foot remains closed at the fifth met head. The area over the base of the left fifth metatarsal has a small open area but considerable undermining towards the plantar foot. Thick callus skin around this suggests an adequate pressure relief. We have talked about this. He says he is going to go back into his cam boot. I suggested a total contact cast he did not seem enamored with this suggestion 10/26; left foot base of the fifth metatarsal. Same condition as last time. He has skin over the area with an open wound however the skin is not adherent. He went to see Dr. Loreta AveWagner who did an x-ray and culture of his foot I have not reviewed the x-ray but the patient was not told anything. He is on doxycycline 11/11; since the patient was last here he was in the emergency room on 10/30 he was concerned about swelling in the left foot. They did not do any cultures or x-rays. They changed his antibiotics to cephalexin. Previous culture showed group B strep. The cephalexin is appropriate as doxycycline has less than predictable coverage. Arrives in clinic today with swelling over this area under the wound. He also has a new wound on the right fifth metatarsal head 11/18; the patient has a difficult wound on the lateral aspect of the left fifth metatarsal head. The wound was almost ballotable last week I opened it slightly expecting to see purulence however there was just bleeding. I cultured this this was negative. X-ray unchanged. We are trying to get an MRI but I am not sure were going  to be able to get this through his insurance. He also has an area on the right lateral fifth metatarsal head this looks healthier 12/3; the patient finally got our MRI. Surprisingly this did not show osteomyelitis. I did show the soft tissue ulceration at the lateral plantar aspect of the fifth metatarsal base  with a tiny residual 6 mm abscess overlying the superficial fascia I have tried to culture this area I have not been able to get this to grow anything. Nevertheless the protruding tissue looks aggravated. I suspect we should try to treat the underlying "abscess with broad-spectrum antibiotics. I am going to start him on Levaquin and Flagyl. He has much less edema in his legs and I am going to continue to wrap his legs and see him weekly 12/10. I started Levaquin and Flagyl on him last week. He just picked up the Flagyl apparently there was some delay. The worry is the wound on the left fifth metatarsal base which is substantial and worsening. His foot looks like he inverts at the ankle making this a weightbearing surface. Certainly no improvement in fact I think the measurements of this are somewhat worse. We have been using 12/17; he apparently just got the Levaquin yesterday this is 2 weeks after the fact. He has completed the Flagyl. The area over the left fifth metatarsal base still has protruding granulation tissue although it does not look quite as bad as it did some weeks ago. He has severe bilateral lymphedema although we have not been treating him for wounds on his legs this is definitely going to require compression. There was so much edema in the left I did not wish to put him in a total contact cast today. I am going to increase his compression from 3-4 layer. The area on the right lateral fifth met head actually look quite good and superficial. 12/23; patient arrived with callus on the right fifth met head and the substantial hyper granulated callused wound on the base of his fifth metatarsal. He says he is completing his Levaquin in 2 days but I do not think that adds up with what I gave him but I will have to double check this. We are using Hydrofera Blue on both areas. My plan is to put the left leg in a cast the week after New Year's 04/06/2020; patient's wounds about the same. Right  lateral fifth metatarsal head and left lateral foot over the base of the fifth metatarsal. There is undermining on the left lateral foot which I removed before application of total contact cast continuing with Hydrofera Blue new. Patient tells me he was seen by endocrinology today lab work was done [Dr. Kerr]. Also wondering whether he was referred to cardiology. I went over some lab work from previously does not have chronic renal failure certainly not nephrotic range proteinuria he does have very poorly controlled diabetes but this is not his most updated lab work. Hemoglobin A1c has been over 11 1/10; the patient had a considerable amount of leakage towards mid part of his left foot with macerated skin however the wound surface looks better the area on the right lateral fifth met head is better as well. I am going to change the dressing on the left foot under the total contact cast to silver alginate, continue with Hydrofera Blue on the right. 1/20; patient was in the total contact cast for 10 days. Considerable amount of drainage although the skin around the wound does not look too bad  on the left foot. The area on the right fifth metatarsal head is closed. Our nursing staff reports large amount of drainage out of the left lateral foot wound [place], [name] ([id number]) [id number] Page 14 of 21 1/25; continues with copious amounts of drainage described by our intake staff. PCR culture I did last week showed E. coli and Enterococcus faecalis and low quantities. Multiple resistance genes documented including extended spectrum beta lactamase, MRSA, MRSE, quinolone, tetracycline. The wound is not quite as good this week as it was 5 days ago but about the same size 2/3; continues with copious amounts of malodorous drainage per our intake nurse. The PCR culture I did 2 weeks ago showed E. coli and low quantities of Enterococcus. There were multiple resistance genes detected.  I put Neosporin on him last week although this does not seem to have helped. The wound is slightly deeper today. Offloading continues to be an issue here although with the amount of drainage she has a total contact cast is just not going to work 2/10; moderate amount of drainage. Patient reports he cannot get his stocking on over the dressing. I told him we have to do that the nurse gave him suggestions on how to make this work. The wound is on the bottom and lateral part of his left foot. Is cultured predominantly grew low amounts of Enterococcus, E. coli and anaerobes. There were multiple resistance genes detected including extended spectrum beta lactamase, quinolone, tetracycline. I could not think of an easy oral combination to address this so for now I am going to do topical antibiotics provided by [facility] I think the main agents here are vancomycin and an aminoglycoside. We have to be able to give him access to the wounds to get the topical antibiotic on 2/17; moderate amount of drainage this is unchanged. He has his Keystone topical antibiotic against the deep tissue culture organisms. He has been using this and changing the dressing daily. Silver alginate on the wound surface. 2/24; using Keystone antibiotic with silver alginate on the top. He had too much drainage for a total contact cast at one point although I think that is improving and I think in the next week or 2 it might be possible to replace a total contact cast I did not do this today. In general the wound surface looks healthy however he continues to have thick rims of skin and subcutaneous tissue around the wide area of the circumference which I debrided 06/04/2020 upon evaluation today patient appears to be doing well in regard to his wound. I do feel like he is showing signs of improvement. There is little bit of callus and dead tissue around the edges of the wound as well as what appears to be a little bit of a sinus tract that is  off to the side laterally I would perform debridement to clear that away today. 3/17; left lateral foot. The wound looks about the same as I remember. Not much depth surface looks healthy. No evidence of infection 3/25; left lateral foot. Wound surface looks about the same. Separating epithelium from the circumference. There really is no evidence of infection here however not making progress by my view 3/29; left lateral foot. Surface of the wound again looks reasonably healthy still thick skin and subcutaneous tissue around the wound margins. There is no evidence of infection. One of the concerns being brought up by the nurses has again the amount of drainage vis--vis continued use of a total contact cast  4/5; left lateral foot at roughly the base of the fifth metatarsal. Nice healthy looking granulated tissue with rims of epithelialization. The overall wound measurements are not any better but the tissue looks healthy. The only concern is the amount of drainage although he has no surrounding maceration with what we have been doing recently to absorb fluid and protect his skin. He also has lymphedema. 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 He states that the kerlix/coban under the TCC rolled down and could not get it back up. He has been using an offloading scooter and has somehow hurt his right foot using this device. This happened last week. He states that the side of his right foot developed a blister and opened. The top of his foot also has a few small open wounds he thinks is due to his socks rubbing in his shoes. He has not been using any dressings to the wound. He denies purulent drainage, fever/chills or erythema to the wounds. 4/22; patient  presents for 1 week follow-up. He developed new wounds to the right foot that were evaluated at last clinic visit. He continues to have a total contact cast to the left leg and he reports no issues. He has been using silver collagen to the right foot wounds with no issues. He denies purulent drainage, fever/chills or erythema to the right foot wounds. He has no complaints today 4/25; patient presents for 1 week follow-up. He has a total contact cast of the left leg and reports no issues. He has been using silver alginate to the right foot wound. He denies purulent drainage, fever/chills or erythema to the right foot wounds. 5/2 patient presents for 1 week follow-up. T contact cast on the left. The wound which is on the base of the plantar foot at the base of the fifth metatarsal otal actually looks quite good and dimensions continue to gradually contract. HOWEVER the area on the right lateral fifth metatarsal head is much larger than what I remember from 2 weeks ago. Once more is he has significant levels of hypergranulation. Noteworthy that he had this same hyper granulated response on his wound on the left foot at one point in time. So much so that he I thought there was an underlying fluid collection. Based on this I think this just needs debridement. 5/9; the wound on the left actually continues to be gradually smaller with a healthy surface. Slight amount of drainage and maceration of the skin around but not too bad. However he has a large wound over the right fifth metatarsal head very much in the same configuration as his left foot wound was initially. I used silver nitrate to address the hyper granulated tissue no mechanical debridement 5/16; area on the left foot did not look as healthy this week deeper thick surrounding macerated skin and subcutaneous tissue. oo The area on the right foot fifth met head was about the same oo The area on the right ankle that we identified last week is  completely broken down into an open wound presumably a stocking rubbing issue 5/23; patient has been using a total contact cast to the left side. He has been using silver alginate underneath. He has also been using silver alginate  to the right foot wounds. He has no complaints today. He denies any signs of infection. 5/31; the left-sided wound looks some better measure smaller surface granulation looks better. We have been using silver alginate under the total contact cast oo The large area on his right fifth met head and right dorsal foot look about the same still using silver alginate 6/6; neither side is good as I was hoping although the surface area dimensions are better. A lot of maceration on his left and right foot around the wound edge. Area on the dorsal right foot looks better. He says he was traveling. I am not sure what does the amount of maceration around the plantar wounds may be drainage issues 6/13; in general the wound surfaces look quite good on both sides. Macerated skin and raised edges around the wound required debridement although in general especially on the left the surface area seems improved. oo The area on the right dorsal ankle is about the same I thought this would not be such a problem to close 6/20; not much change in either wound although the one on the right looks a little better. Both wounds have thick macerated edges to the skin requiring debridements. We have been using silver alginate. The area on the dorsal right ankle is still open I thought this would be closed. 6/28; patient comes in today with a marked deterioration in the right foot wound fifth met head. Wide area of exposed bone this is a drastic change from last time. The area on the left there we have been casting is stagnant. We have been using silver alginate in both wound areas. 7/5; bone culture I did for PCR last time was positive for Pseudomonas, group B strep, Enterococcus and Staph aureus. There  was no suggestion of methicillin resistance or ampicillin resistant genes. This was resistant to tetracycline however He comes into the clinic today with the area over his right plantar fifth metatarsal head which had been doing so well 2 weeks ago completely necrotic feeling bone. I do not know that this is going to be salvageable. The left foot wound is certainly no smaller but it has a better surface and is superficial. 7/8; patient called in this morning to say that his total contact cast was rubbing against his foot. He states he is doing fine overall. He denies signs of infection. 7/12; continued deterioration in the wound over the right fifth metatarsal head crumbling bone. This is not going to be salvageable. The patient agrees and wants to be referred to Dr. Victorino Dike which we will attempt to arrange as soon as possible. I am going to continue him on antibiotics as long as that takes so I will renew those today. The area on the left foot which is the base of the fifth metatarsal continues to look somewhat better. Healthy looking tissue no depth no debridement is necessary here. 7/20; the patient was kindly seen by Dr. Victorino Dike of orthopedics on 10/19/2020. 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 Towards this end we have taken him out of the total contact cast on the left we will put him in a wrap with Hydrofera Blue. [place], [name] ([id number]) [id number] Page 15 of 21 As I understand things surgery is planned for 7/21 7/27; patient had his surgery last Thursday. 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 Apparently everything went well we  did not still disturb that today The area on the left foot actually looks quite good. He has been much less mobile which probably explains this he did not seem to do well in the total contact cast secondary to drainage and maceration I  think. 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 Fortunately there is no evidence of active infection at this time. No fevers, chills, nausea, vomiting, or diarrhea. Overall I think that he is actually doing extremely well. Nonetheless I do believe that he is staying off of this more following the surgery in his right foot that is the reason the left is doing so great. 8/16; left plantar foot wound. This looks smaller than the last time I saw this he is using Hydrofera Blue. The surgical wound on the right foot is being followed by Dr. Doran Durand we did not look at this today. He has surgical shoes on both feet 8/23; left plantar foot wound not as good this week. Surrounding macerated skin and subcutaneous tissue everything looks moist and wet. I do not think he is offloading this adequately. He is using a surgical shoe Apparently the right foot surgical wound is not open although I did not check his foot 8/31; left plantar foot lateral aspect. Much improved this week. He has no maceration. Some improvement in the surface area of the wound but most impressively the depth is come in we are using silver alginate. The patient is a Product/process development scientist. He is asked that we write him a letter so he can go back to work. I have also tried to see if we can write something that will allow him to limit the amount of time that he is on his foot at work. 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 Hopefully the latter can be avoided 9/6; I believe the patient missed an appointment last week. He arrives in today with a wound looking roughly the same certainly no better. Undermining laterally and also inferiorly. We used molecuLight today in training with the patient's permission.. We are using silver alginate 9/21 wound is  measuring bigger this week although this may have to do with the aggressive circumferential debridement last week in response to the blush fluorescence on the MolecuLight. Culture I did last week showed significant MSSA and E. coli. I put him on Augmentin but he has not started it yet. We are also going to send this for compounded antibiotics at [name]. There is no evidence of systemic infection 9/29; silver alginate. His Keystone arrived. He is completing Augmentin in 2 days. Offloading in a cam boot. Moderate drainage per our intake staff 10/5; using silver alginate. He has been using his [place]. He has completed his Augmentin. Per our intake nurse still a lot of drainage, far too much to consider a total contact cast. Wound measures about the same. He had the same undermining area that I defined last week from a roughly 11-3. I remove this today 10/12; using silver alginate he is using the [place]. He comes in for a nurse visit hence we are applying [employer] twice a week. Measuring slightly better today and less notable drainage. Extensive debridement of the wound edge last time 10/18; using topical Keystone and silver alginate and a soft cast. Wound measurements about the same. Drainage  was through his soft cast. We are changing this twice a week Tuesdays and Friday 10/25; comes in with moderate drainage. Still using Keystone silver alginate and a soft cast. Wound dimensions completely the same.He has a lot of edema in the left leg he has lymphedema. Asking for [name] to consider wrapping him as he cannot get his stocking on over the soft cast 11/2; comes in with moderate to large drainage slightly smaller in terms of width we have been using [place]. His wound looks satisfactory but not much improvement 11/4; patient presents today for obligatory cast change. Has no issues or complaints today. He denies signs of infection. 11/9; patient traveled this weekend to [place], was on the cast quite a bit.  Staining of the cast with black material from his walking boot. Drainage was not quite as bad as we feared. Using silver alginate and Keystone 11/16; we do not have size for cast therefore we have been putting a soft cast on him since the change on Friday. Still a significant amount of drainage necessitating changing twice a week. We have been using the Keystone at cast changes either hard or soft as well as silver alginate Comes in the clinic with things actually looking fairly good improvement in width. He says his offloading is about the same 02/24/2021 upon evaluation today patient actually comes back in and is doing excellent in regard to his foot ulcer this is significantly smaller even compared to the last visit. The soft cast seems to have done extremely well for him which is great news. I do not see any signs of infection minimal debridement will be needed today. 11/30; left lateral foot much improved half a centimeter improvement in surface area. No evidence of infection. He seems to be doing better with the soft cast in the TCC therefore we will continue with this. He comes back in later in the week for a change with the nurses. This is due to drainage 12/6; no improvement in dimensions. Under illumination some debris on the surface we have been using silver alginate, soft cast. If there is anything optimistic here he seems to have have less drainage 12/13. Dimensions are improved both length and width and slightly in depth. Appears to be quite healthy today. Raised edges of this thick skin and callus around the edges however. He is in a soft cast were bringing him back once for a change on Friday. Drainage is better 12/20. Dimensions are improved. He still has raised edges of thick skin and callus around the edges. We are using a soft cast 12/28; comes in today with thick callus around the wound. Using silver under alginate under a soft cast. I do not think there is much improvement in  any measurement 2023 04/06/2021; patient was put in a total contact cast. Unfortunately not much change in surface area 1/10; not much different still thick callus and skin around the edge in spite of the total contact cast. This was just debrided last week we have been using the [facility] compounded antibiotic and silver alginate under a total contact cast 1/18 the patient's wound on the left side is doing nicely. smaller HOWEVER he comes in today with a wound on the right foot laterally. blister most likely serosangquenous drainage 1/24; the patient continues to do well in terms of the plantar left foot which is continued to contract using silver alginate under the total contact cast HOWEVER the right lateral foot is bigger with denuded skin around the edges.  I used pickups and a #15 scalpel to remove this this looks like the remanence of a large blister. Cannot rule out infection. Culture in this area I did last week showed Staphylococcus lugdunensis few colonies. I am going to try to address this with his Jodie Echevaria antibiotic that is done so well on the left having linezolid and this should cover the staph 2/1; the patient's wound on his left foot which was the original plantar foot wound thick skin and eschar around the edges even in the total contact cast but the wound surface does not look too bad The real problem is on how his right lateral foot at roughly the base of the fifth metatarsal. The wound is completely necrotic more worrisome than that there is swelling around the edges of this. We have been using silver alginate on both wounds and Keystone on the right foot. Unfortunately I think he is going to require systemic antibiotics while we await cultures. He did not get the x-ray done that we ordered last week [lost the prescription 2/7; disappointingly in the area on the left foot which we are treating with a total contact cast is still not closed although it is much smaller. He continues  to have a lot of callus around the wound edge. -Right lateral foot culture I did last week was negative x-ray also negative for osteomyelitis. 2/15: TCC silver alginate on the left and silver alginate on the right lateral. No real improvement in either area 05/26/2021: T oday, the wounds are roughly the same size as at his previous visit, post-debridement. He continues to endorse fairly substantial drainage, particularly on the right. He has been in a total contact cast on the left. There is still some callus surrounding this lesion. On the right, the periwound skin is [place], [name] ([id number]) [id number] Page 16 of 21 quite macerated, along with surrounding callus. The center of the right-sided wound also has some dark, densely adherent material, which is very difficult to remove. 06/02/2021: Today, both wounds are slightly smaller. He has been using zinc oxide ointment around the right ulcer and the degree of maceration has improved markedly. There continues to be an area of nonviable tissue in the center of the right sided ulcer. The left-sided wound, which has been in the total contact cast. Appears clean and the degree of callus around it is less than previously. 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 He had to take the stairs and both wounds have increased in size. The left foot, which has been in a total contact cast, has developed a tunnel tracking to the lateral aspect of his foot. The nonviable tissue in the center of the right-sided ulcer remains recalcitrant to debridement. There is significant undermining surrounding the entirety of the left sided wound. 06/16/2021: The elevator at school has been fixed and the patient has been able to avoid putting as much weight on his wounds over the past week. We converted the left foot wound into a single lesion today, but despite this, the wound is  actually smaller. The base is healthy with limited periwound callus. On the right, the central necrotic area is still present. He continues to be quite macerated around the right sided wound, despite applying barrier cream. This does, however, have the benefit of softening the callus to make it more easily removable. 06/23/2021: Today, the left wound is smaller. The lateral aspect that had opened up previously is now  closed. The wound base has a healthy bed of granulation tissue and minimal slough. Unfortunately, on the right, the wound is larger and continues to be fairly macerated. He has also reopened the wound at his right ankle. He thinks this is due to the gait he has adopted secondary to his total contact cast and boot. 06/30/2021: T oday, both wounds are a little bit larger. The lateral aspect on the left has remained closed. He continues to have significant periwound maceration. The culture that I took from the right sided wound grew a population of bacteria that is not covered by his current [facility] antibiotic. The center of the right- sided wound continues to appear necrotic with nonviable fat. It probes deeper today, but does not reach bone. 07/07/2021: The periwound maceration is a little bit less today. The right lateral foot wound has some areas that appear more viable and the necrotic center also looks a little bit better. The wound on the dorsal surface of his right foot near the ankle is contracting and the surface appears healthy. The left plantar wound surface looks healthy, but there is some new undermining on the medial portion. He did get his new Keystone antibiotic and began applying that to the right foot wound on Saturday. 07/14/2021: The intake nurse reported substantial drainage from his wounds, but the periwound skin actually looks better than is typical for him. The wound on the dorsal surface of his right foot near the ankle is smaller and just has a small open area  underneath some dried eschar. The left plantar wound surface looks healthy and there has been no significant accumulation of callus. The right lateral foot wound looks quite a bit better, with the central portion, which typically appears necrotic, looking more viable albeit pale. 07/22/2021: His left foot is extremely macerated today. The wound is about the same size. The wound on the dorsal surface of his right foot near the ankle had closed, but he traumatized it removing the dressing and there is a tiny skin tear in that location. The right lateral foot wound is bigger, but the surface appears healthy. 07/30/2021: The wound on the dorsal surface of his right foot near the ankle is closed. The right lateral foot wound again is a little bit bigger due to some undermining. The periwound skin is in better condition, however. He has been applying zinc oxide. The wound surface is a little bit dry today. On the left, he does not have the substantial maceration that we frequently see. The wound itself is smaller and has a clean surface. 08/06/2021: Both wounds seem to have deteriorated over the past week. The right lateral foot wound has a dry surface but the periwound is boggy.. Overall wound dimensions are about the same. On the left, the wound is about the same size, but there is more undermining present underneath periwound callus. 08/13/2021: The right sided wound looks about the same, but on the left there has been substantial deterioration. The undermining continues to extend under periwound callus. Once this was removed, substantial extension of the wound was present. There is no odor or purulent drainage but clearly the wounds have broken down. 08/20/2021: The wounds look about the same today. He has been out of his total contact cast and has just been changing the dressings using topical Keystone with PolyMem Ag, Kerlix and Ace bandages. The wound on the top of his right ankle has reopened but this is  quite small. There was a little bit  of purulent material that I expressed when examining this wound. 08/24/2021: After the aggressive debridement I performed at his last visit, the wounds actually look a little bit better today. They are smaller with the exception of the wound on the top of his right ankle which is a little bit bigger as some more skin pulled off when he was changing his dressing. We are using topical Keystone with PolyMem Ag Kerlix and Ace bandages. 09/02/2021: There has been really no change to any of his wounds. 09/16/2021: The patient was hospitalized last week with nausea, vomiting, and dehydration. He says that while he was in the hospital, his wounds were not really addressed properly. T oday, both plantar foot wounds are larger and the periwound skin is macerated. The wound on the dorsum of his right foot has a scab on the top. The right foot now has a crater where previously he had had nonviable fat. It looks as though this simply died and fell out. The periwound callus is wet. 09/24/2021: His wounds have deteriorated somewhat since his last visit. The wound on the dorsum of his right foot near his ankle is larger and has more nonviable tissue present. The crater in his right foot is even deeper; I cannot quite palpate or probe to bone but I am sure it is close. The wound on his left plantar foot has an odd boggy area in the center that almost feels as though it has fluid within it. He has run out of his topical Keystone antibiotic. We are using silver alginate on his wounds. 09/29/2021: He has developed a new wound on the dorsum of his left foot near his ankle. He says he thinks his wrapping is rubbing in that site. I would concur with this as the wound on his right ankle is larger. The left foot looks about the same. The right foot has the crater that was present last week. No significant slough accumulation, but his foot remains quite swollen and warm despite oral antibiotic  therapy. 10/08/2021: All of his wounds look about the same as last week. He did not start his oral antibiotics that are prescribed until just a couple of days ago; his Jodie Echevaria compounded antibiotics formula has been changed and he is awaiting delivery of the new recipe. His MRI that was scheduled for earlier this week was canceled as no prior authorization had been obtained; unfortunately the tech responsible sent an email to my old [place] email, which I no longer use nor have access to. 10/18/2021: The wounds on his bilateral dorsal feet near the ankles are both improved. They are smaller and have just some eschar and slough buildup. The left plantar wound has a fair amount of undermining, but the surface is clean. There is some periwound callus accumulation. On the right plantar foot, there is nonviable fat leading to a deep tunnel that tracks towards his dorsal medial foot. There is periwound callus and slough accumulation, as well. His right foot and leg remain swollen as compared to the left. 10/25/2021: The wounds on his bilateral dorsal feet and at the ankles have broken down somewhat. They are little bit larger than last week. The left plantar wound continues to undermine laterally but the surface is clean. The right plantar foot wound shows some decreased depth in the tunnel tracking towards his dorsal medial foot. He has not yet had the Doppler study that I ordered; it sounds like there is some confusion about the scheduling of the procedure.  In addition, the MRI was denied and I have taken steps to appeal the denial. 11/24/2021: Since our last visit, Mr. Hilda Bladesrmstrong was admitted to the hospital where an MRI suggested osteomyelitis. He was taken the operating room by podiatry. Bone biopsies were negative for osteomyelitis. They debrided his wounds and applied myriad matrix. He saw them last week and they removed his staples. He is here today to continue his wound healing process. T oday, both  of the dorsal foot/ankle wounds are substantially smaller. There is just a little eschar overlying the left sided wound and some eschar and slough on the right. The right plantar foot ulcer has the healthiest surface of granulation tissue that I have seen to date. A portion of the myriad matrix failed to take and was hanging loose. It appears that myriad morcells were placed into the tunnel closest to [name] ([id number]) [id number] Page 17 of 21 the dorsal portion of his foot. These have sloughed off. The left plantar foot ulcer is about the same size, but has a much healthier surface than in the past. Both plantar ulcers have callus and slough accumulation. 12/07/2021: Left dorsal foot/ankle wound is closed. The right dorsal foot/ankle wound is nearly closed and just has a small open area with some eschar and slough. The right plantar foot wound has contracted quite a bit since our last visit. It has a healthy surface with just a little bit of slough accumulation and periwound callus buildup. The left plantar foot wound is about the same size but the surface appears healthy. There is a little slough and periwound callus on this side, as well. 12/15/2021: Both dorsal foot/ankle wounds are closed. The right plantar foot wound is substantially smaller than at our last visit. The tunneling that was present has nearly closed. There is just a little bit of slough buildup. The left plantar foot wound is also a little bit smaller today. The surface is the healthiest that I have ever seen it. Light slough and periwound callus accumulation on this side. 12/22/2021: The dorsal ankle/foot wounds remain closed. The right plantar foot wound continues to contract. There is still a bit of depth at the lateral portion of the wound but the surface has a good granulated appearance. The wound on the left is about the same size, the central indented portion is still adherent. It also is  clean with good granulation tissue present. The periwound skin and callus are a bit macerated, however. 12/29/2021: Both ankle wounds remain closed. The right plantar foot wound is less than half the size that it was last week. There is no depth any longer and the surface has just a little bit of slough and periwound callus. On the left, the wound is not much smaller, but the surface is healthier with good granulation tissue. There is also a little slough and periwound callus buildup. 01/05/2022: The right plantar foot wound continues to contract and is once again about half the size as it was last week. There is just a little bit of surface slough and periwound callus. On the left, the depth of the wound has decreased and the diameter has started to contract. It is also very clean with just a little slough and biofilm present. 01/12/2022: The right plantar foot wound is smaller again today. There is just some periwound callus and slough accumulation. On the left, there is a fair amount of undermining and the surface is a little boggy, but no other significant change. 01/19/2022: The right  plantar wound continues to contract. There is minimal periwound callus accumulation and just a light layer of slough on the surface. On the left, the surface looks better, but there is fairly significant undermining near the 11:00 portion of the wound, aiming towards his great toe. The skin overlying this area is very healthy and has a good fat layer present. No malodor or purulent drainage. 01/26/2022: The right sided wound continues to contract and has just a light layer of slough on the surface. Minimal periwound callus. On the left, he still has substantial undermining and the tissue surface is not as robust as I would like to see. No malodor or purulent drainage, but he did say he had a lot of serous drainage over the weekend. 02/02/2022: The right foot wound is about a quarter of the size that it was at his last  visit. There is just a little slough on the surface and some periwound callus. On the left, the undermining persists and the tissue is still more pale, but he does not seem to have had as much drainage over the last weekend. We are waiting on a wound VAC. 02/09/2022: His right foot has healed. He received the wound VAC, but did not bring it to clinic with him today. The lateral aspect of the left foot wound has come in a little, but he still has substantial undermining on the medial and distal portion of the wound. Still with macerated periwound callus. 02/16/2022: The wound VAC was applied last Friday. T oday, there has been tremendous improvement in the surface of the wound bed. The undermined portion of the wound has come in a bit. There is still some overhanging dead skin. Overall the wound looks much better. 02/23/2022: No significant change in the overall wound dimensions, but the wound surface continues to improve and appears healthier and more robust. There is some periwound callus accumulation and overhanging skin. No concern for infection. 03/02/2022: Although the measurements taken in clinic today were unchanged, the visual appearance of the wound looks like it is smaller and the undermining/tunneling is filling with flesh. There is less periwound tissue maceration than usual. No malodor or purulent drainage. No concern for infection. 03/09/2022: The undermining has come in by couple of millimeters. The periwound is a little bit more macerated this week. No concern for infection. 12/13; substantial wound on the left plantar foot. We are using silver collagen and a wound VAC. 03/23/2022: The undermining continues to contract. The wound surface is a little bit dry. 03/30/2022: The undermining has essentially resolved. There is still some depth at the center of the wound. The wound surface has good beefy tissue and the moisture balance is better. 04/06/2022: The wound dimensions are about the  same, except that the depth is beginning to fill in. He has had more drainage this week, but there is no obvious concern for infection. 04/13/2022: He has built up a little bit of callus around the edges of the wound, creating some undermining. Otherwise the wound looks fairly unchanged. 04/20/2022: The wound bed tissue looks very healthy and robust. Light biofilm on the surface. There is some built up wet callus around the wound edges, but no undermining. Patient History Information obtained from Patient. Family History No family history of Cancer, Diabetes, Hereditary Spherocytosis, Hypertension, Kidney Disease, Lung Disease, Seizures, Stroke, Thyroid Problems, Tuberculosis. Social History Never smoker, Marital Status - Single, Alcohol Use - Rarely, Drug Use - No History, Caffeine Use - Never. Medical History Eyes Denies  history of Cataracts, Glaucoma, Optic Neuritis Ear/Nose/Mouth/Throat Denies history of Chronic sinus problems/congestion, Middle ear problems Hematologic/Lymphatic Denies history of Anemia, Hemophilia, Human Immunodeficiency Virus, Lymphedema, Sickle Cell Disease Respiratory Denies history of Aspiration, Asthma, Chronic Obstructive Pulmonary Disease (COPD), Pneumothorax, Sleep Apnea, Tuberculosis Cardiovascular Denies history of Angina, Arrhythmia, Congestive Heart Failure, Coronary Artery Disease, Deep Vein Thrombosis, Hypertension, Hypotension, Myocardial Infarction, Peripheral Arterial Disease, Peripheral Venous Disease, Phlebitis, Vasculitis Gastrointestinal [place], [name] ([id number]) [id number] Page 18 of 21 Denies history of Cirrhosis , Colitis, Crohnoos, Hepatitis A, Hepatitis B, Hepatitis C Endocrine Patient has history of Type II Diabetes Denies history of Type I Diabetes Immunological Denies history of Lupus Erythematosus, Raynaudoos, Scleroderma Integumentary (Skin) Denies history of History of Burn Musculoskeletal Denies  history of Gout, Rheumatoid Arthritis, Osteoarthritis, Osteomyelitis Neurologic Denies history of Dementia, Neuropathy, Quadriplegia, Paraplegia, Seizure Disorder Oncologic Denies history of Received Chemotherapy, Received Radiation Psychiatric Denies history of Anorexia/bulimia, Confinement Anxiety Hospitalization/Surgery History - 11/1-11/06/2018- sepsis foot infection. - 11/4-11/5 02 sats low respiratory distress. Objective Constitutional Hypertensive, asymptomatic. no acute distress. Vitals Time Taken: 8:52 AM, Height: 77 in, Weight: 280 lbs, BMI: 33.2, Temperature: 97.6 F, Pulse: 76 bpm, Respiratory Rate: 16 breaths/min, Blood Pressure: 151/89 mmHg. Respiratory Normal work of breathing on room air. General Notes: 04/20/2022: The wound bed tissue looks very healthy and robust. Light biofilm on the surface. There is some built up wet callus around the wound edges, but no undermining. Integumentary (Hair, Skin) Wound #3 status is Open. Original cause of wound was Trauma. The date acquired was: 10/02/2019. The wound has been in treatment 129 weeks. The wound is located on the Left,Lateral,Plantar Foot. The wound measures 3.5cm length x 3.8cm width x 1cm depth; 10.446cm^2 area and 10.446cm^3 volume. There is Fat Layer (Subcutaneous Tissue) exposed. There is no undermining noted, however, there is tunneling at 3:00 with a maximum distance of 0.8cm. There is a medium amount of serosanguineous drainage noted. The wound margin is distinct with the outline attached to the wound base. There is large (67-100%) red granulation within the wound bed. There is a small (1-33%) amount of necrotic tissue within the wound bed including Adherent Slough. The periwound skin appearance had no abnormalities noted for color. The periwound skin appearance exhibited: Callus, Maceration. Periwound temperature was noted as No Abnormality. 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 There was a Selective/Open Wound Skin/Epidermis Debridement with a total area of 13.3 sq cm performed by Duanne Guess, MD. With the following instrument(s): Curette to remove Non-Viable tissue/material. Material removed includes Callus, Skin: Epidermis, and Biofilm. No specimens were taken. A time out was conducted at 09:20, prior to the start of the procedure. A Minimum amount of bleeding was controlled with Pressure. The procedure was tolerated well with a pain level of Insensate throughout and a pain level of Insensate following the procedure. Post Debridement Measurements: 3.5cm length x 3.8cm width x 1cm depth; 10.446cm^3 volume. Character of Wound/Ulcer Post Debridement is improved. Severity of Tissue Post Debridement is: Fat layer exposed. Post procedure Diagnosis Wound #3: Same as Pre-Procedure Plan [place], [name] ([id number]) [id number] Page 19 of 21 Follow-up Appointments: Return Appointment in 1 week. - Dr. Lady Gary - Room 1 with [facility] Wednesday 1/24 @ 0845 am Bathing/ Shower/  Hygiene: May shower and wash wound with soap and water. Negative Presssure Wound Therapy: Wound #3 Left,Lateral,Plantar Foot: Wound Vac to wound continuously at 171mm/hg pressure Black Foam Edema Control - Lymphedema / SCD / Other: Avoid standing for long periods of time. Exercise regularly Compression stocking or Garment 20-30 mm/Hg pressure to: - to both legs daily Off-Loading: Open toe surgical shoe to: - Both feet Additional Orders / Instructions: Follow Nutritious Diet Non Wound Condition: Apply the following to affected area as directed: - continue to apply foam donut or cushion to  healed right plantar foot WOUND #3: - Foot Wound Laterality: Plantar, Left, Lateral Cleanser: Soap and Water 3 x Per Week/30 Days Discharge Instructions: May shower and wash wound with dial antibacterial soap and water prior to dressing change. Peri-Wound Care: Skin Prep (DME) (Generic) 3 x Per Week/30 Days Discharge Instructions: Use skin prep as directed Prim Dressing: Promogran Prisma Matrix, 4.34 (sq in) (silver collagen) (DME) (Generic) 3 x Per Week/30 Days ary Discharge Instructions: Moisten collagen with saline or hydrogel Prim Dressing: VAC 3 x Per Week/30 Days ary Secondary Dressing: Zetuvit Plus 4x8 in (DME) (Generic) 3 x Per Week/30 Days Discharge Instructions: Apply over primary dressing as directed. Secured With: Elastic Bandage 4 inch (ACE bandage) (DME) (Generic) 3 x Per Week/30 Days Discharge Instructions: Secure with ACE bandage as directed. Secured With: 63M Medipore Scientist, research (life sciences) Surgical T 2x10 (in/yd) (Generic) 3 x Per Week/30 Days ape Discharge Instructions: Secure with tape as directed. Com pression Wrap: Kerlix Roll 4.5x3.1 (in/yd) (DME) (Generic) 3 x Per Week/30 Days Discharge Instructions: Apply Kerlix and Coban compression as directed. 04/20/2022: The wound bed tissue looks very healthy and robust. Light biofilm on the surface. There is some built up wet callus around the wound edges, but no undermining. I used a curette to debride callus and biofilm from the wound. We will continue to apply Prisma silver collagen to the wound surface and continue with negative pressure wound therapy. Follow-up in 1 week. Electronic Signature(s) Signed: 04/20/2022 9:56:31 AM By: Duanne Guess MD FACS Entered By: Duanne Guess on 04/20/2022 09:56:31 -------------------------------------------------------------------------------- HxROS Details Patient Name: Date of Service: [name], [name] 04/20/2022 8:45 A M Medical Record Number: [id number] Patient Account Number:  [id number] Date of Birth/Sex: Treating RN: [birth year] (36 y.o. M) Primary Care Provider: [name] Other Clinician: Referring Provider: Treating Provider/Extender: [name] in Treatment: 129 Information Obtained From Patient Eyes Medical History: Negative for: Cataracts; Glaucoma; Optic Neuritis Ear/Nose/Mouth/Throat Medical History: Negative for: Chronic sinus problems/congestion; Middle ear problems Hematologic/Lymphatic [place], [name] ([id number]) [id number] Page 20 of 21 Medical History: Negative for: Anemia; Hemophilia; Human Immunodeficiency Virus; Lymphedema; Sickle Cell Disease Respiratory Medical History: Negative for: Aspiration; Asthma; Chronic Obstructive Pulmonary Disease (COPD); Pneumothorax; Sleep Apnea; Tuberculosis Cardiovascular Medical History: Negative for: Angina; Arrhythmia; Congestive Heart Failure; Coronary Artery Disease; Deep Vein Thrombosis; Hypertension; Hypotension; Myocardial Infarction; Peripheral Arterial Disease; Peripheral Venous Disease; Phlebitis; Vasculitis Gastrointestinal Medical History: Negative for: Cirrhosis ; Colitis; Crohns; Hepatitis A; Hepatitis B; Hepatitis C Endocrine Medical History: Positive for: Type II Diabetes Negative for: Type I Diabetes Time with diabetes: 8 Treated with: Insulin Blood sugar tested every day: No Immunological Medical History: Negative for: Lupus Erythematosus; Raynauds; Scleroderma Integumentary (Skin) Medical History: Negative for: History of Burn Musculoskeletal Medical History: Negative for: Gout; Rheumatoid Arthritis; Osteoarthritis; Osteomyelitis Neurologic Medical History: Negative for: Dementia; Neuropathy; Quadriplegia; Paraplegia; Seizure Disorder Oncologic Medical History: Negative for: Received Chemotherapy; Received Radiation Psychiatric Medical History: Negative for: Anorexia/bulimia; Confinement  Anxiety Immunizations Pneumococcal Vaccine: Received Pneumococcal Vaccination: No Implantable Devices None Hospitalization / Surgery History Type of Hospitalization/Surgery 11/1-11/06/2018- sepsis foot infection 11/4-11/5 02 sats low respiratory distress Family and Social History Cancer: No; Diabetes: No; Hereditary Spherocytosis: No; Hypertension: No; Kidney Disease: No; Lung Disease: No; Seizures: No; Stroke: No; Thyroid Problems: No; Tuberculosis: No; Never smoker; Marital Status - Single; Alcohol Use: Rarely; Drug Use: No History; Caffeine Use: Never; Financial Concerns: No; Food, Clothing or Shelter Needs: No; Support System Lacking: No; Transportation Concerns: No [place], [name] ([id number]) [id number] Page 21 of 21 Electronic Signature(s) Signed: 04/20/2022 10:14:00 AM By: Duanne Guess MD FACS Entered By: Duanne Guess on 04/20/2022 09:54:46 -------------------------------------------------------------------------------- SuperBill Details Patient Name: Date of Service: [name], [name] 04/20/2022 Medical Record Number: [id number] Patient Account Number: [id number] Date of Birth/Sex: Treating RN: 01-07-1986 (36 y.o. M) Primary Care Provider: [name] Other Clinician: Referring Provider: Treating Provider/Extender: [name] in Treatment: 129 Diagnosis Coding ICD-10 Codes Code Description [phone] Non-pressure chronic ulcer of other part of left foot with other specified severity E11.621 Type 2 diabetes mellitus with foot ulcer Facility Procedures : CPT4 Code: [id number] Description: 97597 - DEBRIDE WOUND 1ST 20 SQ CM OR < ICD-10 Diagnosis Description L97.528 Non-pressure chronic ulcer of other part of left foot with other specified severi Modifier: ty Quantity: 1 : CPT4 Code: 44315400 Description: 86761 - WOUND VAC-50 SQ CM OR LESS Modifier: Quantity: 1 Physician Procedures : CPT4 Code Description  Modifier [id number] - WC PHYS LEVEL 4 - EST PT 25 ICD-10 Diagnosis Description L97.528 Non-pressure chronic ulcer of other part of left foot with other specified severity E11.621 Type 2 diabetes mellitus with foot ulcer Quantity: 1 : [id number] 97597 - WC PHYS DEBR WO ANESTH 20 SQ CM ICD-10 Diagnosis Description L97.528 Non-pressure chronic ulcer of other part of left foot with other specified severity Quantity: 1 Electronic Signature(s) Signed: 04/20/2022 9:56:52 AM By: Duanne Guess MD FACS Entered By: Duanne Guess on 04/20/2022 09:56:51

## 2022-04-22 ENCOUNTER — Ambulatory Visit (INDEPENDENT_AMBULATORY_CARE_PROVIDER_SITE_OTHER): Payer: BC Managed Care – PPO | Admitting: Podiatry

## 2022-04-22 DIAGNOSIS — M79674 Pain in right toe(s): Secondary | ICD-10-CM | POA: Diagnosis not present

## 2022-04-22 DIAGNOSIS — M79675 Pain in left toe(s): Secondary | ICD-10-CM | POA: Diagnosis not present

## 2022-04-22 DIAGNOSIS — E1149 Type 2 diabetes mellitus with other diabetic neurological complication: Secondary | ICD-10-CM | POA: Diagnosis not present

## 2022-04-22 DIAGNOSIS — B351 Tinea unguium: Secondary | ICD-10-CM

## 2022-04-25 NOTE — Progress Notes (Signed)
Subjective: Chief Complaint  Patient presents with   routine foot care     [facility]    37 year old male presents the office today for diabetic foot exam, thick, elongated toes that he cannot trim himself.  He is still going to wound care center.  He has a wound VAC on the left side.  Objective: AAO x3, NAD Noted to be hypertrophic, dystrophic with yellow, brown discoloration with central debris digits 1-4 bilaterally. Wounds currently dressings on them. No pain with calf compression, swelling, warmth, erythema  Assessment: Symptomatic onychomycosis  Plan: -All treatment options discussed with the patient including all alternatives, risks, complications.  -Sharply debride the nails x8 without any complications or bleeding. -Will defer to the wound care center for further treatment of the wounds.  -Patient encouraged to call the office with any questions, concerns, change in symptoms.   Trula Slade DPM

## 2022-04-27 ENCOUNTER — Encounter (HOSPITAL_BASED_OUTPATIENT_CLINIC_OR_DEPARTMENT_OTHER): Payer: BC Managed Care – PPO | Admitting: General Surgery

## 2022-04-27 DIAGNOSIS — E11621 Type 2 diabetes mellitus with foot ulcer: Secondary | ICD-10-CM | POA: Diagnosis not present

## 2022-04-27 NOTE — Progress Notes (Signed)
[place], [name] [id number] Page 1 of 8 Visit Report for 04/27/2022 Arrival Information Details Patient Name: Date of Service: [name] 04/27/2022 8:45 A M Medical Record Number: [id number] Patient Account Number: [id number] Date of Birth/Sex: Treating RN: 11-24-1985 (37 y.o. M) Primary Care Khyren Hing: [name] Other Clinician: Referring Jillann Charette: Treating Thayden Lemire/Extender: [name] in Treatment: 79 Visit Information History Since Last Visit All ordered tests and consults were completed: No Patient Arrived: Ambulatory Added or deleted any medications: No Arrival Time: 09:00 Any new allergies or adverse reactions: No Accompanied By: wife Had a fall or experienced change in No Transfer Assistance: None activities of daily living that may affect Patient Identification Verified: Yes risk of falls: Secondary Verification Process Completed: Yes Signs or symptoms of abuse/neglect since No Patient Requires Transmission-Based Precautions: No last visito Patient Has Alerts: No Hospitalized since last visit: No Implantable device outside of the clinic No excluding cellular tissue based products placed in the center since last visit: Has Dressing in Place as Prescribed: Yes Has Footwear/Offloading in Place as Yes Prescribed: Left: Surgical Shoe with Pressure Relief Insole Pain Present Now: No Electronic Signature(s) Signed: 04/27/2022 5:16:51 PM By: Baruch Gouty RN, BSN Entered By: Baruch Gouty on 04/27/2022 09:12:13 -------------------------------------------------------------------------------- Encounter Discharge Information Details Patient Name: Date of Service: [name], [name] 04/27/2022 8:45 A M Medical Record Number: [id number] Patient Account Number: [id number] Date of Birth/Sex: Treating RN: 05/18/1985 (37 y.o. [name] Primary Care Vegas Fritze: [name] Other  Clinician: Referring Catharina Pica: Treating Ritchard Paragas/Extender: [name] in Treatment: [phone] Encounter Discharge Information Items Post Procedure Vitals Discharge Condition: Stable Temperature (F): 97.7 Ambulatory Status: Ambulatory Pulse (bpm): 92 Discharge Destination: Home Respiratory Rate (breaths/min): 18 Transportation: Private Auto Blood Pressure (mmHg): 139/80 Accompanied By: fiance Schedule Follow-up Appointment: Yes Clinical Summary of Care: Patient Declined Electronic Signature(s) Signed: 04/27/2022 5:16:51 PM By: Baruch Gouty RN, BSN Entered By: Baruch Gouty on 04/27/2022 11:47:34 [place], [name] ([id number]) [id number] Page 2 of 8 -------------------------------------------------------------------------------- Lower Extremity Assessment Details Patient Name: Date of Service: [name] 04/27/2022 8:45 A M Medical Record Number: [id number] Patient Account Number: [id number] Date of Birth/Sex: Treating RN: [birth year] (37 y.o. [name] Primary Care Rylin Saez: [name] Other Clinician: Referring Elora Wolter: Treating Danisha Brassfield/Extender: [name] in Treatment: 130 Edema Assessment Assessed: [name]: No] [Right: No] Edema: [Left: Ye] [Right: s] Calf Left: Right: Point of Measurement: 48 cm From Medial Instep 47 cm Ankle Left: Right: Point of Measurement: 11 cm From Medial Instep 30.5 cm Vascular Assessment Pulses: Dorsalis Pedis Palpable: [Left:Yes] Electronic Signature(s) Signed: 04/27/2022 5:16:51 PM By: Baruch Gouty RN, BSN Entered By: Baruch Gouty on 04/27/2022 09:13:23 -------------------------------------------------------------------------------- Multi Wound Chart Details Patient Name: Date of Service: [name], [name] 04/27/2022 8:45 A M Medical Record Number: [id number] Patient Account Number: [id number] Date of Birth/Sex: Treating RN: 1985-09-24  (37 y.o. M) Primary Care Ilean Spradlin: [name] Other Clinician: Referring Floreen Teegarden: Treating Naziya Hegwood/Extender: [name] in Treatment: 130 Vital Signs Height(in): 77 Capillary Blood Glucose(mg/dl): 89 Weight(lbs): 280 Pulse(bpm): 92 Body Mass Index(BMI): 33.2 Blood Pressure(mmHg): 139/80 Temperature(F): 97.7 Respiratory Rate(breaths/min): 20 [3:Photos:] [N/A:N/A] Left, Lateral, Plantar Foot N/A N/A Wound Location: Trauma N/A N/A Wounding Event: Diabetic Wound/Ulcer of the Lower N/A N/A Primary Etiology: Extremity Type II Diabetes N/A N/A Comorbid History: 10/02/2019 N/A N/A Date Acquired: 130 N/A N/A Weeks of Treatment: Open N/A N/A Wound Status: No N/A N/A Wound Recurrence: 3.2x3.9x1.8  N/A N/A Measurements L x W x [name] 9.802 N/A N/A A (cm) : rea 17.643 N/A N/A Volume (cm) : -494.40% N/A N/A % Reduction in A rea: -10592.70% N/A N/A % Reduction in Volume: Grade 2 N/A N/A Classification: Medium N/A N/A Exudate A mount: Serosanguineous N/A N/A Exudate Type: red, brown N/A N/A Exudate Color: Distinct, outline attached N/A N/A Wound Margin: Large (67-100%) N/A N/A Granulation A mount: Red N/A N/A Granulation Quality: Small (1-33%) N/A N/A Necrotic A mount: Fat Layer (Subcutaneous Tissue): Yes N/A N/A Exposed Structures: Fascia: No Tendon: No Muscle: No Joint: No Bone: No Small (1-33%) N/A N/A Epithelialization: Debridement - Selective/Open Wound N/A N/A Debridement: Pre-procedure Verification/Time Out 09:20 N/A N/A Taken: Callus N/A N/A Tissue Debrided: Skin/Epidermis N/A N/A Level: 12.48 N/A N/A Debridement A (sq cm): rea Curette N/A N/A Instrument: Minimum N/A N/A Bleeding: Pressure N/A N/A Hemostasis A chieved: 0 N/A N/A Procedural Pain: 0 N/A N/A Post Procedural Pain: Procedure was tolerated well N/A N/A Debridement Treatment Response: 3.2x2.9x1.8 N/A N/A Post Debridement Measurements L  x W x [name] 13.119 N/A N/A Post Debridement Volume: (cm) Callus: Yes N/A N/A Periwound Skin Texture: Maceration: Yes N/A N/A Periwound Skin Moisture: No Abnormalities Noted N/A N/A Periwound Skin Color: No Abnormality N/A N/A Temperature: Debridement N/A N/A Procedures Performed: Negative Pressure Wound Therapy Maintenance (NPWT) Treatment Notes Electronic Signature(s) Signed: 04/27/2022 9:34:21 AM By: [name] MD FACS Entered By: [name] on 04/27/2022 09:34:21 -------------------------------------------------------------------------------- Multi-Disciplinary Care Plan Details Patient Name: Date of Service: [name], [name] 04/27/2022 8:45 A M Medical Record Number: [id number] Patient Account Number: [id number] Date of Birth/Sex: Treating RN: 06/09/1985 (37 y.o. [name] Primary Care Soma Lizak: [name] Other Clinician: Referring Arleth Mccullar: Treating Geovana Gebel/Extender: [name] in Treatment: [place] reviewed with physician [place], [name] ([id number]) [id number] Page 4 of 8 Active Inactive Nutrition Nursing Diagnoses: Imbalanced nutrition Potential for alteratiion in Nutrition/Potential for imbalanced nutrition Goals: Patient/caregiver agrees to and verbalizes understanding of need to use nutritional supplements and/or vitamins as prescribed Date Initiated: 10/24/2019 Date Inactivated: 04/06/2020 Target Resolution Date: 04/03/2020 Goal Status: Met Patient/caregiver will maintain therapeutic glucose control Date Initiated: 10/24/2019 Target Resolution Date: 06/01/2022 Goal Status: Active Interventions: Assess HgA1c results as ordered upon admission and as needed Assess patient nutrition upon admission and as needed per policy Provide education on elevated blood sugars and impact on wound healing Provide education on nutrition Treatment Activities: Education  provided on Nutrition : 12/07/2021 Notes: 11/17/20: Glucose control ongoing issue, target date extended. 01/26/21: Glucose management continues. Wound/Skin Impairment Nursing Diagnoses: Impaired tissue integrity Knowledge deficit related to ulceration/compromised skin integrity Goals: Patient/caregiver will verbalize understanding of skin care regimen Date Initiated: 10/24/2019 Target Resolution Date: 06/01/2022 Goal Status: Active Ulcer/skin breakdown will have a volume reduction of 30% by week 4 Date Initiated: 10/24/2019 Date Inactivated: 01/16/2020 Target Resolution Date: 01/10/2020 Unmet Reason: no change in Goal Status: Unmet measurements. Interventions: Assess patient/caregiver ability to obtain necessary supplies Assess patient/caregiver ability to perform ulcer/skin care regimen upon admission and as needed Assess ulceration(s) every visit Provide education on ulcer and skin care Notes: 11/17/20: Wound care regimen continues Electronic Signature(s) Signed: 04/27/2022 5:16:51 PM By: Baruch Gouty RN, BSN Entered By: Baruch Gouty on 04/27/2022 09:18:00 -------------------------------------------------------------------------------- Negative Pressure Wound Therapy Maintenance (NPWT) Details Patient Name: Date of Service: [name] 04/27/2022 8:45 A M Medical Record Number: [id number] Patient Account Number: [id number] Date of Birth/Sex: Treating RN: 1985-11-20 (36 y.o.  [name] Primary Care Broadus Costilla: [name] Other Clinician: Referring Osceola Depaz: Treating Christoffer Currier/Extender: [name] in Treatment: 130 NPWT Maintenance Performed for: Wound #3 Left, Lateral, Plantar Foot Performed By: Zenaida Deed, RN Type: VAC System Coverage Size (sq cm): 12.48 [place], [name] ([id number]) [id number] Page 5 of 8 Pressure Type: Constant Pressure Setting: 125 mmHG Drain Type: None Primary Contact: Other :  prisma Sponge/Dressing Type: Foam, Black Date Initiated: 02/11/2022 Dressing Removed: No Quantity of Sponges/Gauze Removed: 2 Canister Changed: No Dressing Reapplied: No Quantity of Sponges/Gauze Inserted: 2 Respones T Treatment: o good Days On NPWT : 76 Post Procedure Diagnosis Same as Pre-procedure Electronic Signature(s) Signed: 04/27/2022 5:16:51 PM By: Zenaida Deed RN, BSN Entered By: Zenaida Deed on 04/27/2022 09:26:56 -------------------------------------------------------------------------------- Pain Assessment Details Patient Name: Date of Service: [name], [name] 04/27/2022 8:45 A M Medical Record Number: [id number] Patient Account Number: [id number] Date of Birth/Sex: Treating RN: 1986-03-04 (36 y.o. M) Primary Care Jalen Oberry: [name] Other Clinician: Referring Sovereign Ramiro: Treating Breean Nannini/Extender: [name] in Treatment: 130 Active Problems Location of Pain Severity and Description of Pain Patient Has Paino No Site Locations Rate the pain. Current Pain Level: 0 Pain Management and Medication Current Pain Management: Electronic Signature(s) Signed: 04/27/2022 5:16:51 PM By: Zenaida Deed RN, BSN Entered By: Zenaida Deed on 04/27/2022 09:12:37 [place], [name] ([id number]) [id number] Page 6 of 8 -------------------------------------------------------------------------------- Patient/Caregiver Education Details Patient Name: Date of Service: [name] 1/24/2024andnbsp8:45 A M Medical Record Number: [id number] Patient Account Number: [id number] Date of Birth/Gender: Treating RN: 05/06/1985 (37 y.o. [name] Primary Care Physician: [name] Other Clinician: Referring Physician: Treating Physician/Extender: [name] in Treatment: 130 Education Assessment Education Provided To: Patient Education Topics Provided Elevated Blood Sugar/  Impact on Healing: Methods: Explain/Verbal Responses: Reinforcements needed, State content correctly Offloading: Methods: Explain/Verbal Responses: Reinforcements needed, State content correctly Wound/Skin Impairment: Methods: Explain/Verbal Responses: Reinforcements needed, State content correctly Electronic Signature(s) Signed: 04/27/2022 5:16:51 PM By: Zenaida Deed RN, BSN Entered By: Zenaida Deed on 04/27/2022 09:18:38 -------------------------------------------------------------------------------- Wound Assessment Details Patient Name: Date of Service: [name], [name] 04/27/2022 8:45 A M Medical Record Number: [id number] Patient Account Number: [id number] Date of Birth/Sex: Treating RN: 1985/05/18 (36 y.o. M) Primary Care Grissel Tyrell: [name] Other Clinician: Referring Yossef Gilkison: Treating Eiliyah Reh/Extender: [name] in Treatment: 130 Wound Status Wound Number: 3 Primary Etiology: Diabetic Wound/Ulcer of the Lower Extremity Wound Location: Left, Lateral, Plantar Foot Wound Status: Open Wounding Event: Trauma Comorbid History: Type II Diabetes Date Acquired: 10/02/2019 Weeks Of Treatment: 130 Clustered Wound: No Photos [place], [name] ([id number]) [id number] Page 7 of 8 Wound Measurements Length: (cm) 3.2 Width: (cm) 3.9 Depth: (cm) 1.8 Area: (cm) 9.802 Volume: (cm) 17.643 % Reduction in Area: -494.4% % Reduction in Volume: -10592.7% Epithelialization: Small (1-33%) Tunneling: No Undermining: No Wound Description Classification: Grade 2 Wound Margin: Distinct, outline attached Exudate Amount: Medium Exudate Type: Serosanguineous Exudate Color: red, brown Foul Odor After Cleansing: No Slough/Fibrino No Wound Bed Granulation Amount: Large (67-100%) Exposed Structure Granulation Quality: Red Fascia Exposed: No Necrotic Amount: Small (1-33%) Fat Layer (Subcutaneous Tissue) Exposed: Yes Necrotic  Quality: Adherent Slough Tendon Exposed: No Muscle Exposed: No Joint Exposed: No Bone Exposed: No Periwound Skin Texture Texture Color No Abnormalities Noted: No No Abnormalities Noted: Yes Callus: Yes Temperature / Pain Temperature: No Abnormality Moisture No Abnormalities Noted: Yes Treatment Notes Wound #3 (Foot) Wound Laterality: Plantar, Left, Lateral Cleanser Soap and Water  Discharge Instruction: May shower and wash wound with dial antibacterial soap and water prior to dressing change. Peri-Wound Care Skin Prep Discharge Instruction: Use skin prep as directed Topical Primary Dressing Promogran Prisma Matrix, 4.34 (sq in) (silver collagen) Discharge Instruction: Moisten collagen with saline or hydrogel VAC Secondary Dressing Zetuvit Plus 4x8 in Discharge Instruction: Apply over primary dressing as directed. Secured With Elastic Bandage 4 inch (ACE bandage) Discharge Instruction: Secure with ACE bandage as directed. 35M Medipore Soft Cloth Surgical T 2x10 (in/yd) ape Discharge Instruction: Secure with tape as directed. Compression Wrap [place], [name] ([id number]) [id number] Page 8 of 8 Kerlix Roll 4.5x3.1 (in/yd) Discharge Instruction: Apply Kerlix and Coban compression as directed. Compression Stockings Add-Ons Electronic Signature(s) Signed: 04/27/2022 5:16:51 PM By: Baruch Gouty RN, BSN Entered By: Baruch Gouty on 04/27/2022 09:16:11 -------------------------------------------------------------------------------- Vitals Details Patient Name: Date of Service: [name], [name] 04/27/2022 8:45 A M Medical Record Number: [id number] Patient Account Number: [id number] Date of Birth/Sex: Treating RN: 1986/03/22 (37 y.o. M) Primary Care Nealie Mchatton: [name] Other Clinician: Referring Dedria Endres: Treating Delante Karapetyan/Extender: [name] in Treatment: 130 Vital Signs Time Taken: 09:01 Temperature (F):  97.7 Height (in): 77 Pulse (bpm): 92 Weight (lbs): 280 Respiratory Rate (breaths/min): 20 Body Mass Index (BMI): 33.2 Blood Pressure (mmHg): 139/80 Capillary Blood Glucose (mg/dl): 89 Reference Range: 80 - 120 mg / dl Notes glucose per pt report this am Electronic Signature(s) Signed: 04/27/2022 5:16:51 PM By: Baruch Gouty RN, BSN Entered By: Baruch Gouty on 04/27/2022 09:12:30

## 2022-04-27 NOTE — Progress Notes (Signed)
[place], [name] ([id number]) [id number] Page 1 of 21 Visit Report for 04/27/2022 Chief Complaint Document Details Patient Name: Date of Service: [name] 04/27/2022 8:45 A M Medical Record Number: [id number] Patient Account Number: [id number] Date of Birth/Sex: Treating RN: 1986/01/21 (37 y.o. M) Primary Care Provider: [name] Other Clinician: Referring Provider: Treating Provider/Extender: [name] in Treatment: 130 Information Obtained from: Patient Chief Complaint 01/11/2019; patient is here for review of a rather substantial wound over the left fifth plantar metatarsal head extending into the lateral part of his foot 10/24/2019; patient returns to clinic with wounds on his bilateral feet with underlying osteomyelitis biopsy-proven Electronic Signature(s) Signed: 04/27/2022 9:35:28 AM By: Duanne Guess MD FACS Entered By: Duanne Guess on 04/27/2022 09:35:28 -------------------------------------------------------------------------------- Debridement Details Patient Name: Date of Service: [name], [name] 04/27/2022 8:45 A M Medical Record Number: [id number] Patient Account Number: [id number] Date of Birth/Sex: Treating RN: 01-May-1985 (37 y.o. [name] [name] Primary Care Provider: [name] Other Clinician: Referring Provider: Treating Provider/Extender: [name] in Treatment: 130 Debridement Performed for Assessment: Wound #3 Left,Lateral,Plantar Foot Performed By: Physician Duanne Guess, MD Debridement Type: Debridement Severity of Tissue Pre Debridement: Fat layer exposed Level of Consciousness (Pre-procedure): Awake and Alert Pre-procedure Verification/Time Out Yes - 09:20 Taken: Start Time: 09:20 T Area Debrided (L x W): otal 3.2 (cm) x 3.9 (cm) = 12.48 (cm) Tissue and other material debrided: Non-Viable, Callus, Skin: Epidermis, Biofilm Level:  Skin/Epidermis Debridement Description: Selective/Open Wound Instrument: Curette Bleeding: Minimum Hemostasis Achieved: Pressure Procedural Pain: 0 Post Procedural Pain: 0 Response to Treatment: Procedure was tolerated well Level of Consciousness (Post- Awake and Alert procedure): Post Debridement Measurements of Total Wound Length: (cm) 3.2 Width: (cm) 2.9 Depth: (cm) 1.8 Volume: (cm) 13.119 Character of Wound/Ulcer Post Debridement: Improved Severity of Tissue Post Debridement: Fat layer exposed [place], [name] ([id number]) [id number] Page 2 of 21 Post Procedure Diagnosis Same as Pre-procedure Notes Scribed for Dr. Lady Gary by Zenaida Deed, RN Electronic Signature(s) Signed: 04/27/2022 10:27:15 AM By: Duanne Guess MD FACS Signed: 04/27/2022 5:16:51 PM By: Zenaida Deed RN, BSN Entered By: Zenaida Deed on 04/27/2022 09:31:01 -------------------------------------------------------------------------------- HPI Details Patient Name: Date of Service: [name], [name] 04/27/2022 8:45 A M Medical Record Number: [id number] Patient Account Number: [id number] Date of Birth/Sex: Treating RN: [birth year] (37 y.o. 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 He comes in for review of a wound over the plantar fifth metatarsal head extending into the lateral part of the foot. He was followed for this previously by his podiatrist Dr. Darreld Mclean. As the patient tells his story he went to see podiatry first for a swelling he developed on the lateral part of his fifth metatarsal head in May. He states this was "open" by podiatry and the area closed. He was followed up in June and it was again opened  callus removed and it closed promptly. There were plans being made for surgery on the fifth metatarsal head in June however his blood sugar was apparently too high for anesthesia. Apparently the area was debrided and opened again in June and it is never closed since. Looking over the records from podiatry I am really not able to follow this. It was clear when he was first seen it  was before 5/14 at that point he already had a wound. By 5/17 the ulcer was resolved. I do not see anything about a procedure. On 5/28 noted to have pre-ulcerative moderate keratosis. X-ray noted 1/5 contracted toe and tailor's bunion and metatarsal deformity. On a visit date on 09/28/2018 the dorsal part of the left foot it healed and resolved. There was concern about swelling in his lower extremity he was sent to the ER.. As far as I can tell he was seen in the ER on 7/12 with an ulcer on his left foot. A DVT rule out of the left leg was negative. I do not think I have complete records from podiatry but I am not able to verify the procedures this patient states he had. He states after the last procedure the wound has never closed although I am not able to follow this in the records I have from podiatry. He has not had a recent x-ray The patient has been using Neosporin on the wound. He is wearing a Darco shoe. He is still very active up on his foot working and exercising. Past medical history; type 2 diabetes ketosis-prone, leg swelling with a negative DVT study in July. Non-smoker ABI in our clinic was 0.85 on the left 10/16; substantial wound on the plantar left fifth met head extending laterally almost to the dorsal fifth MTP. We have been using silver alginate we gave him a Darco forefoot off loader. An x-ray did not show evidence of osteomyelitis did note soft tissue emphysema which I think was due to gas tracking through an open wound. There is no doubt in my mind he requires an MRI 10/23; MRI not booked until 3  November at the earliest this is largely due to his glucose sensor in the right arm. We have been using silver alginate. There has been an improvement 10/29; I am still not exactly sure when his MRI is booked for. He says it is the third but it is the 10th in epic. This definitely needs to be done. He is running a low-grade fever today but no other symptoms. No real improvement in the 1 02/26/2019 patient presents today for a follow-up visit here in our clinic he is last been seen in the clinic on October 29. Subsequently we were working on getting MRI to evaluate and see what exactly was going on and where we would need to go from the standpoint of whether or not he had osteomyelitis and again what treatments were going be required. Subsequently the patient ended up being admitted to the hospital on 02/07/2019 and was discharged on 02/14/2019. This is a somewhat interesting admission with a discharge diagnosis of pneumonia due to COVID-19 although he was positive for COVID-19 when tested at the urgent care but negative x2 when he was actually in the hospital. With that being said he did have acute respiratory failure with hypoxia and it was noted he also have a left foot ulceration with osteomyelitis. With that being said he did require oxygen for his pneumonia and I level 4 L. He was placed on antivirals and steroids for the COVID-19. He was also transferred to the [facility] campus at one point. Nonetheless he did subsequently discharged home and since being home has done much better in that regard. The CT angiogram did not show any pulmonary embolism. With regard to the osteomyelitis the patient was placed on vancomycin and Zosyn while in the hospital but has been changed to Augmentin at discharge.  It was also recommended that he follow- up with wound care and podiatry. Podiatry however wanted him to see [name] according to the patient prior to them doing anything further. His hemoglobin A1c was 9.9  as noted in the hospital. Have an MRI of the left foot performed while in the hospital on 02/04/2019. This showed evidence of septic arthritis at the fifth MTP joint and osteomyelitis involving the fifth metatarsal head and proximal phalanx. There is an overlying plantar open wound noted an abscess tracking back along the lateral aspect of the fifth metatarsal shaft. There is otherwise diffuse cellulitis and mild fasciitis without findings of polymyositis. The patient did have recently pneumonia secondary to COVID-19 I looked in the chart through epic and it does appear that the patient may need to have an additional x-ray just to ensure everything is cleared and that he has no airspace disease prior to putting him into the chamber. 03/05/2019; patient was readmitted to the clinic last week. He was hospitalized twice for a viral upper respiratory tract infection from 11/1 through 11/4 and then 11/5 through 11/12 ultimately this turned out to be Covid pneumonitis. Although he was discharged on oxygen he is not using it. He says he feels fine. He has no exercise limitation no cough no sputum. His O2 sat in our clinic today was 100% on room air. 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 He received Vanco and Zosyn in the hospital and then was discharged on 2 weeks of Augmentin. I do not see any relevant cultures. He was supposed to [place], [name] ([id number]) [id number] Page 3 of 21 follow-up with infectious disease but I do not see that he has an appointment. 12/8; patient saw Dr. Ronnie Derby of infectious disease last week. He felt that he had had adequate antibiotic therapy. He did not go to follow-up with Dr. Logan Bores of podiatry and I have again talked to him about the pros and cons of this. He does not want to consider a ray amputation of this time. He is aware of  the risks of recurrence, migration etc. He started HBO today and tolerated this well. He can complete the Augmentin that I gave him last week. I have looked over the lab work that Dr. Effie Shy ordered his C-reactive protein was 3.3 and his sedimentation rate was 17. The C-reactive protein is never really been measurably that high in this patient 12/15; not much change in the wound today however he has undermining along the lateral part of the foot again more extensively than last week. He has some rims of epithelialization. We have been using silver alginate. He is undergoing hyperbarics but did not dive today 12/18; in for his obligatory first total contact cast change. Unfortunately there was pus coming from the undermining area around his fifth metatarsal head. This was cultured but will preclude reapplication of a cast. He is seen in conjunction with HBO 12/24; patient had staph lugdunensis in the wound in the undermining area laterally last time. We put him on doxycycline which should have covered this. The wound looks better today. I am going to give him another week of doxycycline before reattempting the total contact cast 12/31; the patient is completing antibiotics. Hemorrhagic debris in the distal part of the wound with some undermining distally. He also had hyper granulation. Extensive debridement with a #5 curette. The infected area that was on the  lateral part of the fifth met head is closed over. I do not think he needs any more antibiotics. Patient was seen prior to HBO. Preparations for a total contact cast were made in the cast will be placed post hyperbarics 04/11/19; once again the patient arrives today without complaint. He had been in a cast all week noted that he had heavy drainage this week. This resulted in large raised areas of macerated tissue around the wound 1/14; wound bed looks better slightly smaller. Hydrofera Blue has been changing himself. He had a heavy drainage last  week which caused a lot of maceration around the wound so I took him out of a total contact cast he says the drainage is actually better this week He is seen today in conjunction with HBO 1/21; returns to clinic. He was up in [place] for a day or 2 attending a funeral. He comes back in with the wound larger and with a large area of exposed bone. He had osteomyelitis and septic arthritis of the fifth left metatarsal head while he was in hospital. He received IV antibiotics in the hospital for a prolonged period of time then 3 weeks of Augmentin. Subsequently I gave him 2 weeks of doxycycline for more superficial wound infection. When I saw this last week the wound was smaller the surface of the wound looks satisfactory. 1/28; patient missed hyperbarics today. Bone biopsy I did last time showed Enterococcus faecalis and Staphylococcus lugdunensis . He has a wide area of exposed bone. We are going to use silver alginate as of today. I had another ethical discussion with the patient. 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 In this situation I think the likelihood of healing this is low. Therefore I have recommended a ray amputation and with the patient's agreement I have referred him to Dr. Victorino Dike. The other issue is that his compliance with hyperbarics has been minimal because of his work schedule and given his underlying decision I am going to stop this today READMISSION 10/24/2019 MRI 09/29/2019 left foot IMPRESSION: 1. Apparent skin ulceration inferior and lateral to the 5th metatarsal base with underlying heterogeneous T2 signal and enhancement in the subcutaneous fat. Small peripherally enhancing fluid collections along the plantar and lateral aspects of the 5th metatarsal base suspicious for abscesses. 2. Interval amputation through the mid 5th metatarsal with nonspecific low-level marrow edema and enhancement. Given the proximity to the adjacent soft  tissue inflammatory changes, osteomyelitis cannot be excluded. 3. The additional bones appear unremarkable. MRI 09/29/2019 right foot IMPRESSION: 1. Soft tissue ulceration lateral to the 5th MTP joint. There is low-level T2 hyperintensity within the 4th and 5th metatarsal heads and adjacent proximal phalanges without abnormal T1 signal or cortical destruction. These findings are nonspecific and could be seen with early marrow edema, hyperemia or early osteomyelitis. No evidence of septic joint. 2. Mild tenosynovitis and synovial enhancement associated with the extensor digitorum tendons at the level of the midfoot. 3. Diffuse low-level muscular T2 hyperintensity and enhancement, most consistent with diabetic myopathy. LEFT FOOT BONE Methicillin resistant staphylococcus aureus Staphylococcus lugdunensis MIC MIC CIPROFLOXACIN >=8 RESISTANT Resistant <=0.5 SENSI... Sensitive CLINDAMYCIN <=0.25 SENS... Sensitive >=8 RESISTANT Resistant ERYTHROMYCIN >=8 RESISTANT Resistant >=8 RESISTANT Resistant GENTAMICIN <=0.5 SENSI... Sensitive <=0.5 SENSI... Sensitive Inducible Clindamycin NEGATIVE Sensitive NEGATIVE Sensitive OXACILLIN >=4 RESISTANT Resistant 2 SENSITIVE Sensitive RIFAMPIN <=0.5 SENSI... Sensitive <=0.5 SENSI... Sensitive TETRACYCLINE <=1 SENSITIVE Sensitive <=1 SENSITIVE Sensitive TRIMETH/SULFA <=10 SENSIT Sensitive <=10 SENSIT Sensitive ... Marland Kitchen.. VANCOMYCIN 1  SENSITIVE Sensitive <=0.5 SENSI... Sensitive Right foot bone . Component 3 wk ago Specimen Description BONE Special Requests RIGHT 4 METATARSAL SAMPLE B Gram Stain NO WBC SEEN NO ORGANISMS SEEN Culture RARE METHICILLIN RESISTANT STAPHYLOCOCCUS AUREUS NO ANAEROBES ISOLATED Performed at [facility], [address], [place], [place] [address] Report Status 10/08/2019 FINAL Organism ID, Bacteria METHICILLIN RESISTANT STAPHYLOCOCCUS AUREUS [place], [name] ([id number]) [id number] Page 4 of  21 Resulting Agency [facility] Susceptibility Methicillin resistant staphylococcus aureus MIC CIPROFLOXACIN >=8 RESISTANT Resistant CLINDAMYCIN <=0.25 SENS... Sensitive ERYTHROMYCIN >=8 RESISTANT Resistant GENTAMICIN <=0.5 SENSI... Sensitive Inducible Clindamycin NEGATIVE Sensitive OXACILLIN >=4 RESISTANT Resistant RIFAMPIN <=0.5 SENSI... Sensitive TETRACYCLINE <=1 SENSITIVE Sensitive TRIMETH/SULFA <=10 SENSIT Sensitive ... VANCOMYCIN 1 SENSITIVE Sensitive This is a patient we had in clinic earlier this year with a wound over his left fifth metatarsal head. 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 Victorino Dike on 05/16/2019. He seems to have developed problems with wounds on his bilateral feet in June 2021 although he may have had problems earlier than this. He was in an urgent care with a right foot ulcer on 09/26/2019 and given a course of doxycycline. This was apparently after having trouble getting into see orthopedics. He was seen by podiatry on 09/28/2019 noted to have bilateral lower extremity ulcers including the left lateral fifth metatarsal base and the right subfifth met head. It was noted that had purulent drainage at that time. He required hospitalization from 6/20 through 7/2. This was because of worsening right foot wounds. He underwent bilateral operative incision and drainage and bone biopsies bilaterally. Culture results are listed above. He has been referred back to clinic by Dr. Ardelle Anton of podiatry. He is also followed by Dr. Orvan Falconer who saw him yesterday. He was discharged from hospital on Zyvox Flagyl and Levaquin and yesterday changed to doxycycline Flagyl and Levaquin. His inflammatory markers  on 6/26 showed a sedimentation rate of 129 and a C-reactive protein of 5. This is improved to 14 and 1.3 respectively. This would indicate improvement. ABIs in our clinic today were 1.23 on the right and 1.20 on the left 11/01/2019 on evaluation today patient appears to be doing fairly well in regard to the wounds on his feet at this point. Fortunately there is no signs of active infection at this time. No fevers, chills, nausea, vomiting, or diarrhea. He currently is seeing infectious disease and still under their care at this point. Subsequently he also has both wounds which she has not been using collagen on as he did not receive that in his packaging he did not call us and let us know that. Apparently that just was missed on the order. Nonetheless we will get that straightened out today. 8/9-Patient returns for bilateral foot wounds, using Prisma with hydrogel moistened dressings, and the wounds appear stable. Patient using surgical shoes, avoiding much pressure or weightbearing as much as possible 8/16; patient has bilateral foot wounds. 1 on the right lateral foot proximally the other is on the left mid lateral foot. Both required debridement of callus and thick skin around the wounds. We have been using silver collagen 8/27; patient has bilateral lateral foot wounds. The area on the left substantially surrounded by callus and dry skin. This was removed  from the wound edge. The underlying wound is small. The area on the right measured somewhat smaller today. 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 Unfortunately I did not update his antibiotics during today's visit. 9/10 I reviewed Dr. Blair Dolphin last notes he felt he had completed antibiotics his inflammatory markers were reasonably well controlled. He has a small wound on the lateral left foot and a tiny area on the right which is just above closed. He is using Hydrofera Blue  with border foam he has bilateral surgical shoes 9/24; 2 week f/u. doing well. right foot is closed. left foot still undermined. 10/14; right foot remains closed at the fifth met head. The area over the base of the left fifth metatarsal has a small open area but considerable undermining towards the plantar foot. Thick callus skin around this suggests an adequate pressure relief. We have talked about this. He says he is going to go back into his cam boot. I suggested a total contact cast he did not seem enamored with this suggestion 10/26; left foot base of the fifth metatarsal. Same condition as last time. He has skin over the area with an open wound however the skin is not adherent. He went to see Dr. Loreta Ave who did an x-ray and culture of his foot I have not reviewed the x-ray but the patient was not told anything. He is on doxycycline 11/11; since the patient was last here he was in the emergency room on 10/30 he was concerned about swelling in the left foot. They did not do any cultures or x-rays. They changed his antibiotics to cephalexin. Previous culture showed group B strep. The cephalexin is appropriate as doxycycline has less than predictable coverage. Arrives in clinic today with swelling over this area under the wound. He also has a new wound on the right fifth metatarsal head 11/18; the patient has a difficult wound on the lateral aspect of the left fifth metatarsal head. The wound was almost ballotable last week I opened it slightly expecting to see purulence however there was just bleeding. I cultured this this was negative. X-ray unchanged. We are trying to get an MRI but I am not sure were going to be able to get this through his insurance. He also has an area on the right lateral fifth metatarsal head this looks healthier 12/3; the patient finally got our MRI. Surprisingly this did not show osteomyelitis. I did show the soft tissue ulceration at the lateral plantar aspect of the  fifth metatarsal base with a tiny residual 6 mm abscess overlying the superficial fascia I have tried to culture this area I have not been able to get this to grow anything. Nevertheless the protruding tissue looks aggravated. I suspect we should try to treat the underlying "abscess with broad-spectrum antibiotics. I am going to start him on Levaquin and Flagyl. He has much less edema in his legs and I am going to continue to wrap his legs and see him weekly 12/10. I started Levaquin and Flagyl on him last week. He just picked up the Flagyl apparently there was some delay. The worry is the wound on the left fifth metatarsal base which is substantial and worsening. His foot looks like he inverts at the ankle making this a weightbearing surface. Certainly no improvement in fact I think the measurements of this are somewhat worse. We have been using 12/17; he apparently just got the Levaquin yesterday this  is 2 weeks after the fact. He has completed the Flagyl. The area over the left fifth metatarsal base still has protruding granulation tissue although it does not look quite as bad as it did some weeks ago. He has severe bilateral lymphedema although we have not been treating him for wounds on his legs this is definitely going to require compression. There was so much edema in the left I did not wish to put him in a total contact cast today. I am going to increase his compression from 3-4 layer. The area on the right lateral fifth met head actually look quite good and superficial. 12/23; patient arrived with callus on the right fifth met head and the substantial hyper granulated callused wound on the base of his fifth metatarsal. He says he is completing his Levaquin in 2 days but I do not think that adds up with what I gave him but I will have to double check this. We are using Hydrofera Blue on both areas. My plan is to put the left leg in a cast the week after New Year's 04/06/2020; patient's wounds  about the same. Right lateral fifth metatarsal head and left lateral foot over the base of the fifth metatarsal. There is undermining on the left lateral foot which I removed before application of total contact cast continuing with Hydrofera Blue new. Patient tells me he was seen by endocrinology today lab work was done [Dr. Kerr]. Also wondering whether he was referred to cardiology. I went over some lab work from previously does not have chronic renal failure certainly not nephrotic range proteinuria he does have very poorly controlled diabetes but this is not his most updated lab work. Hemoglobin A1c has been over 11 1/10; the patient had a considerable amount of leakage towards mid part of his left foot with macerated skin however the wound surface looks better the area on the right lateral fifth met head is better as well. I am going to change the dressing on the left foot under the total contact cast to silver alginate, continue with Hydrofera Blue on the right. 1/20; patient was in the total contact cast for 10 days. Considerable amount of drainage although the skin around the wound does not look too bad on the left foot. The area on the right fifth metatarsal head is closed. Our nursing staff reports large amount of drainage out of the left lateral foot wound 1/25; continues with copious amounts of drainage described by our intake staff. PCR culture I did last week showed E. coli and Enterococcus faecalis and low quantities. Multiple resistance genes documented including extended spectrum beta lactamase, MRSA, MRSE, quinolone, tetracycline. The wound is not quite as good this week as it was 5 days ago but about the same size 2/3; continues with copious amounts of malodorous drainage per our intake nurse. The PCR culture I did 2 weeks ago showed E. coli and low quantities of Enterococcus. There were multiple resistance genes detected. I put Neosporin on him last week although this does not seem  to have helped. The wound is [place], [name] ([id number]) [id number] Page 5 of 21 slightly deeper today. Offloading continues to be an issue here although with the amount of drainage she has a total contact cast is just not going to work 2/10; moderate amount of drainage. Patient reports he cannot get his stocking on over the dressing. I told him we have to do that the nurse gave him suggestions on how to make this work.  The wound is on the bottom and lateral part of his left foot. Is cultured predominantly grew low amounts of Enterococcus, E. coli and anaerobes. There were multiple resistance genes detected including extended spectrum beta lactamase, quinolone, tetracycline. I could not think of an easy oral combination to address this so for now I am going to do topical antibiotics provided by [facility] I think the main agents here are vancomycin and an aminoglycoside. We have to be able to give him access to the wounds to get the topical antibiotic on 2/17; moderate amount of drainage this is unchanged. He has his Keystone topical antibiotic against the deep tissue culture organisms. He has been using this and changing the dressing daily. Silver alginate on the wound surface. 2/24; using Keystone antibiotic with silver alginate on the top. He had too much drainage for a total contact cast at one point although I think that is improving and I think in the next week or 2 it might be possible to replace a total contact cast I did not do this today. In general the wound surface looks healthy however he continues to have thick rims of skin and subcutaneous tissue around the wide area of the circumference which I debrided 06/04/2020 upon evaluation today patient appears to be doing well in regard to his wound. I do feel like he is showing signs of improvement. There is little bit of callus and dead tissue around the edges of the wound as well as what appears to be a little bit of  a sinus tract that is off to the side laterally I would perform debridement to clear that away today. 3/17; left lateral foot. The wound looks about the same as I remember. Not much depth surface looks healthy. No evidence of infection 3/25; left lateral foot. Wound surface looks about the same. Separating epithelium from the circumference. There really is no evidence of infection here however not making progress by my view 3/29; left lateral foot. Surface of the wound again looks reasonably healthy still thick skin and subcutaneous tissue around the wound margins. There is no evidence of infection. One of the concerns being brought up by the nurses has again the amount of drainage vis--vis continued use of a total contact cast 4/5; left lateral foot at roughly the base of the fifth metatarsal. Nice healthy looking granulated tissue with rims of epithelialization. The overall wound measurements are not any better but the tissue looks healthy. The only concern is the amount of drainage although he has no surrounding maceration with what we have been doing recently to absorb fluid and protect his skin. He also has lymphedema. 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 He states that the kerlix/coban under the TCC rolled down and could not get it back up. He has been using an offloading scooter and has somehow hurt his right foot using this device. This happened last week. He states that the side of his right foot developed a blister and opened. The top of his foot also has a few small open wounds he thinks is due to his socks rubbing in his shoes. He has not been using any dressings to the wound. He denies purulent drainage, fever/chills or erythema to the  wounds. 4/22; patient presents for 1 week follow-up. He developed new wounds to the right foot that were evaluated at last clinic visit. He continues to have a total contact cast to the left leg and he reports no issues. He has been using silver collagen to the right foot wounds with no issues. He denies purulent drainage, fever/chills or erythema to the right foot wounds. He has no complaints today 4/25; patient presents for 1 week follow-up. He has a total contact cast of the left leg and reports no issues. He has been using silver alginate to the right foot wound. He denies purulent drainage, fever/chills or erythema to the right foot wounds. 5/2 patient presents for 1 week follow-up. T contact cast on the left. The wound which is on the base of the plantar foot at the base of the fifth metatarsal otal actually looks quite good and dimensions continue to gradually contract. HOWEVER the area on the right lateral fifth metatarsal head is much larger than what I remember from 2 weeks ago. Once more is he has significant levels of hypergranulation. Noteworthy that he had this same hyper granulated response on his wound on the left foot at one point in time. So much so that he I thought there was an underlying fluid collection. Based on this I think this just needs debridement. 5/9; the wound on the left actually continues to be gradually smaller with a healthy surface. Slight amount of drainage and maceration of the skin around but not too bad. However he has a large wound over the right fifth metatarsal head very much in the same configuration as his left foot wound was initially. I used silver nitrate to address the hyper granulated tissue no mechanical debridement 5/16; area on the left foot did not look as healthy this week deeper thick surrounding macerated skin and subcutaneous tissue. The area on the right foot fifth met head was about the same The area on the right ankle that we identified  last week is completely broken down into an open wound presumably a stocking rubbing issue 5/23; patient has been using a total contact cast to the left side. He has been using silver alginate underneath. He has also been using silver alginate to the right foot wounds. He has no complaints today. He denies any signs of infection. 5/31; the left-sided wound looks some better measure smaller surface granulation looks better. We have been using silver alginate under the total contact cast The large area on his right fifth met head and right dorsal foot look about the same still using silver alginate 6/6; neither side is good as I was hoping although the surface area dimensions are better. A lot of maceration on his left and right foot around the wound edge. Area on the dorsal right foot looks better. He says he was traveling. I am not sure what does the amount of maceration around the plantar wounds may be drainage issues 6/13; in general the wound surfaces look quite good on both sides. Macerated skin and raised edges around the wound required debridement although in general especially on the left the surface area seems improved. The area on the right dorsal ankle is about the same I thought this would not be such a problem to close 6/20; not much change in either wound although the one on the right looks a little better. Both wounds have thick macerated edges to the skin requiring debridements. We have been using silver alginate. The area on the  dorsal right ankle is still open I thought this would be closed. 6/28; patient comes in today with a marked deterioration in the right foot wound fifth met head. Wide area of exposed bone this is a drastic change from last time. The area on the left there we have been casting is stagnant. We have been using silver alginate in both wound areas. 7/5; bone culture I did for PCR last time was positive for Pseudomonas, group B strep, Enterococcus and Staph aureus.  There was no suggestion of methicillin resistance or ampicillin resistant genes. This was resistant to tetracycline however He comes into the clinic today with the area over his right plantar fifth metatarsal head which had been doing so well 2 weeks ago completely necrotic feeling bone. I do not know that this is going to be salvageable. The left foot wound is certainly no smaller but it has a better surface and is superficial. 7/8; patient called in this morning to say that his total contact cast was rubbing against his foot. He states he is doing fine overall. He denies signs of infection. 7/12; continued deterioration in the wound over the right fifth metatarsal head crumbling bone. This is not going to be salvageable. The patient agrees and wants to be referred to Dr. Victorino Dike which we will attempt to arrange as soon as possible. I am going to continue him on antibiotics as long as that takes so I will renew those today. The area on the left foot which is the base of the fifth metatarsal continues to look somewhat better. Healthy looking tissue no depth no debridement is necessary here. 7/20; the patient was kindly seen by Dr. Victorino Dike of orthopedics on 10/19/2020. 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 Towards this end we have taken him out of the total contact cast on the left we will put him in a wrap with Hydrofera Blue. As I understand things surgery is planned for 7/21 7/27; patient had his surgery last Thursday. 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 Apparently everything went well we did not still disturb that today The area on the left foot actually looks quite good. He has been much less mobile which probably explains this he did not seem to do well in the total contact cast secondary to drainage and maceration I think. We have been using [facility], [name] ([id number])  [id number] Page 6 of 21 11/09/2020 upon evaluation today patient appears to be doing well with regard to his plantar foot ulcer on the left foot. Fortunately there is no evidence of active infection at this time. No fevers, chills, nausea, vomiting, or diarrhea. Overall I think that he is actually doing extremely well. Nonetheless I do believe that he is staying off of this more following the surgery in his right foot that is the reason the left is doing so great. 8/16; left plantar foot wound. This looks smaller than the last time I saw this he is using Hydrofera Blue. The surgical wound on the right foot is being followed by Dr. Victorino Dike we did not look at this today. He has surgical shoes on both feet 8/23; left plantar foot wound not as good this week. Surrounding macerated skin and subcutaneous tissue everything looks moist and wet. I do not think he is offloading this adequately. He is using a surgical shoe Apparently the right foot surgical  wound is not open although I did not check his foot 8/31; left plantar foot lateral aspect. Much improved this week. He has no maceration. Some improvement in the surface area of the wound but most impressively the depth is come in we are using silver alginate. The patient is a Product/process development scientist. He is asked that we write him a letter so he can go back to work. I have also tried to see if we can write something that will allow him to limit the amount of time that he is on his foot at work. 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 Hopefully the latter can be avoided 9/6; I believe the patient missed an appointment last week. He arrives in today with a wound looking roughly the same certainly no better. Undermining laterally and also inferiorly. We used molecuLight today in training with the patient's permission.. We are using silver alginate 9/21  wound is measuring bigger this week although this may have to do with the aggressive circumferential debridement last week in response to the blush fluorescence on the MolecuLight. Culture I did last week showed significant MSSA and E. coli. I put him on Augmentin but he has not started it yet. We are also going to send this for compounded antibiotics at [facility]. There is no evidence of systemic infection 9/29; silver alginate. His Keystone arrived. He is completing Augmentin in 2 days. Offloading in a cam boot. Moderate drainage per our intake staff 10/5; using silver alginate. He has been using his [place]. He has completed his Augmentin. Per our intake nurse still a lot of drainage, far too much to consider a total contact cast. Wound measures about the same. He had the same undermining area that I defined last week from a roughly 11-3. I remove this today 10/12; using silver alginate he is using the [place]. He comes in for a nurse visit hence we are applying [employer] twice a week. Measuring slightly better today and less notable drainage. Extensive debridement of the wound edge last time 10/18; using topical Keystone and silver alginate and a soft cast. Wound measurements about the same. Drainage was through his soft cast. We are changing this twice a week Tuesdays and Friday 10/25; comes in with moderate drainage. Still using Keystone silver alginate and a soft cast. Wound dimensions completely the same.He has a lot of edema in the left leg he has lymphedema. Asking for [name] to consider wrapping him as he cannot get his stocking on over the soft cast 11/2; comes in with moderate to large drainage slightly smaller in terms of width we have been using [place]. His wound looks satisfactory but not much improvement 11/4; patient presents today for obligatory cast change. Has no issues or complaints today. He denies signs of infection. 11/9; patient traveled this weekend to [place], was on the cast  quite a bit. Staining of the cast with black material from his walking boot. Drainage was not quite as bad as we feared. Using silver alginate and Keystone 11/16; we do not have size for cast therefore we have been putting a soft cast on him since the change on Friday. Still a significant amount of drainage necessitating changing twice a week. We have been using the Keystone at cast changes either hard or soft as well as silver alginate Comes in the clinic with things actually looking fairly good improvement in width. He says  his offloading is about the same 02/24/2021 upon evaluation today patient actually comes back in and is doing excellent in regard to his foot ulcer this is significantly smaller even compared to the last visit. The soft cast seems to have done extremely well for him which is great news. I do not see any signs of infection minimal debridement will be needed today. 11/30; left lateral foot much improved half a centimeter improvement in surface area. No evidence of infection. He seems to be doing better with the soft cast in the TCC therefore we will continue with this. He comes back in later in the week for a change with the nurses. This is due to drainage 12/6; no improvement in dimensions. Under illumination some debris on the surface we have been using silver alginate, soft cast. If there is anything optimistic here he seems to have have less drainage 12/13. Dimensions are improved both length and width and slightly in depth. Appears to be quite healthy today. Raised edges of this thick skin and callus around the edges however. He is in a soft cast were bringing him back once for a change on Friday. Drainage is better 12/20. Dimensions are improved. He still has raised edges of thick skin and callus around the edges. We are using a soft cast 12/28; comes in today with thick callus around the wound. Using silver under alginate under a soft cast. I do not think there is much  improvement in any measurement 2023 04/06/2021; patient was put in a total contact cast. Unfortunately not much change in surface area 1/10; not much different still thick callus and skin around the edge in spite of the total contact cast. This was just debrided last week we have been using the [facility] compounded antibiotic and silver alginate under a total contact cast 1/18 the patient's wound on the left side is doing nicely. smaller HOWEVER he comes in today with a wound on the right foot laterally. blister most likely serosangquenous drainage 1/24; the patient continues to do well in terms of the plantar left foot which is continued to contract using silver alginate under the total contact cast HOWEVER the right lateral foot is bigger with denuded skin around the edges. I used pickups and a #15 scalpel to remove this this looks like the remanence of a large blister. Cannot rule out infection. Culture in this area I did last week showed Staphylococcus lugdunensis few colonies. I am going to try to address this with his [employer] antibiotic that is done so well on the left having linezolid and this should cover the staph 2/1; the patient's wound on his left foot which was the original plantar foot wound thick skin and eschar around the edges even in the total contact cast but the wound surface does not look too bad The real problem is on how his right lateral foot at roughly the base of the fifth metatarsal. The wound is completely necrotic more worrisome than that there is swelling around the edges of this. We have been using silver alginate on both wounds and Keystone on the right foot. Unfortunately I think he is going to require systemic antibiotics while we await cultures. He did not get the x-ray done that we ordered last week [lost the prescription 2/7; disappointingly in the area on the left foot which we are treating with a total contact cast is still not closed although it is much smaller.  He continues to have a lot of callus around the  wound edge. -Right lateral foot culture I did last week was negative x-ray also negative for osteomyelitis. 2/15: TCC silver alginate on the left and silver alginate on the right lateral. No real improvement in either area 05/26/2021: T oday, the wounds are roughly the same size as at his previous visit, post-debridement. He continues to endorse fairly substantial drainage, particularly on the right. He has been in a total contact cast on the left. There is still some callus surrounding this lesion. On the right, the periwound skin is quite macerated, along with surrounding callus. The center of the right-sided wound also has some dark, densely adherent material, which is very difficult to remove. 06/02/2021: Today, both wounds are slightly smaller. He has been using zinc oxide ointment around the right ulcer and the degree of maceration has improved markedly. There continues to be an area of nonviable tissue in the center of the right sided ulcer. The left-sided wound, which has been in the total contact [place], [name] ([id number]) [id number] Page 7 of 21 cast. Appears clean and the degree of callus around it is less than previously. 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 He had to take the stairs and both wounds have increased in size. The left foot, which has been in a total contact cast, has developed a tunnel tracking to the lateral aspect of his foot. The nonviable tissue in the center of the right-sided ulcer remains recalcitrant to debridement. There is significant undermining surrounding the entirety of the left sided wound. 06/16/2021: The elevator at school has been fixed and the patient has been able to avoid putting as much weight on his wounds over the past week. We converted the left foot wound into a single lesion today, but despite this, the  wound is actually smaller. The base is healthy with limited periwound callus. On the right, the central necrotic area is still present. He continues to be quite macerated around the right sided wound, despite applying barrier cream. This does, however, have the benefit of softening the callus to make it more easily removable. 06/23/2021: Today, the left wound is smaller. The lateral aspect that had opened up previously is now closed. The wound base has a healthy bed of granulation tissue and minimal slough. Unfortunately, on the right, the wound is larger and continues to be fairly macerated. He has also reopened the wound at his right ankle. He thinks this is due to the gait he has adopted secondary to his total contact cast and boot. 06/30/2021: T oday, both wounds are a little bit larger. The lateral aspect on the left has remained closed. He continues to have significant periwound maceration. The culture that I took from the right sided wound grew a population of bacteria that is not covered by his current [facility] antibiotic. The center of the right- sided wound continues to appear necrotic with nonviable fat. It probes deeper today, but does not reach bone. 07/07/2021: The periwound maceration is a little bit less today. The right lateral foot wound has some areas that appear more viable and the necrotic center also looks a little bit better. The wound on the dorsal surface of his right foot near the ankle is contracting and the surface appears healthy. The left plantar wound surface looks healthy, but there is some new undermining on the medial portion. He did get his new Keystone antibiotic and began applying that to the right foot wound on Saturday. 07/14/2021:  The intake nurse reported substantial drainage from his wounds, but the periwound skin actually looks better than is typical for him. The wound on the dorsal surface of his right foot near the ankle is smaller and just has a small open area  underneath some dried eschar. The left plantar wound surface looks healthy and there has been no significant accumulation of callus. The right lateral foot wound looks quite a bit better, with the central portion, which typically appears necrotic, looking more viable albeit pale. 07/22/2021: His left foot is extremely macerated today. The wound is about the same size. The wound on the dorsal surface of his right foot near the ankle had closed, but he traumatized it removing the dressing and there is a tiny skin tear in that location. The right lateral foot wound is bigger, but the surface appears healthy. 07/30/2021: The wound on the dorsal surface of his right foot near the ankle is closed. The right lateral foot wound again is a little bit bigger due to some undermining. The periwound skin is in better condition, however. He has been applying zinc oxide. The wound surface is a little bit dry today. On the left, he does not have the substantial maceration that we frequently see. The wound itself is smaller and has a clean surface. 08/06/2021: Both wounds seem to have deteriorated over the past week. The right lateral foot wound has a dry surface but the periwound is boggy.. Overall wound dimensions are about the same. On the left, the wound is about the same size, but there is more undermining present underneath periwound callus. 08/13/2021: The right sided wound looks about the same, but on the left there has been substantial deterioration. The undermining continues to extend under periwound callus. Once this was removed, substantial extension of the wound was present. There is no odor or purulent drainage but clearly the wounds have broken down. 08/20/2021: The wounds look about the same today. He has been out of his total contact cast and has just been changing the dressings using topical Keystone with PolyMem Ag, Kerlix and Ace bandages. The wound on the top of his right ankle has reopened but this is  quite small. There was a little bit of purulent material that I expressed when examining this wound. 08/24/2021: After the aggressive debridement I performed at his last visit, the wounds actually look a little bit better today. They are smaller with the exception of the wound on the top of his right ankle which is a little bit bigger as some more skin pulled off when he was changing his dressing. We are using topical Keystone with PolyMem Ag Kerlix and Ace bandages. 09/02/2021: There has been really no change to any of his wounds. 09/16/2021: The patient was hospitalized last week with nausea, vomiting, and dehydration. He says that while he was in the hospital, his wounds were not really addressed properly. T oday, both plantar foot wounds are larger and the periwound skin is macerated. The wound on the dorsum of his right foot has a scab on the top. The right foot now has a crater where previously he had had nonviable fat. It looks as though this simply died and fell out. The periwound callus is wet. 09/24/2021: His wounds have deteriorated somewhat since his last visit. The wound on the dorsum of his right foot near his ankle is larger and has more nonviable tissue present. The crater in his right foot is even deeper; I cannot quite palpate or  probe to bone but I am sure it is close. The wound on his left plantar foot has an odd boggy area in the center that almost feels as though it has fluid within it. He has run out of his topical Keystone antibiotic. We are using silver alginate on his wounds. 09/29/2021: He has developed a new wound on the dorsum of his left foot near his ankle. He says he thinks his wrapping is rubbing in that site. I would concur with this as the wound on his right ankle is larger. The left foot looks about the same. The right foot has the crater that was present last week. No significant slough accumulation, but his foot remains quite swollen and warm despite oral antibiotic  therapy. 10/08/2021: All of his wounds look about the same as last week. He did not start his oral antibiotics that are prescribed until just a couple of days ago; his [employer] compounded antibiotics formula has been changed and he is awaiting delivery of the new recipe. His MRI that was scheduled for earlier this week was canceled as no prior authorization had been obtained; unfortunately the tech responsible sent an email to my old [place] email, which I no longer use nor have access to. 10/18/2021: The wounds on his bilateral dorsal feet near the ankles are both improved. They are smaller and have just some eschar and slough buildup. The left plantar wound has a fair amount of undermining, but the surface is clean. There is some periwound callus accumulation. On the right plantar foot, there is nonviable fat leading to a deep tunnel that tracks towards his dorsal medial foot. There is periwound callus and slough accumulation, as well. His right foot and leg remain swollen as compared to the left. 10/25/2021: The wounds on his bilateral dorsal feet and at the ankles have broken down somewhat. They are little bit larger than last week. The left plantar wound continues to undermine laterally but the surface is clean. The right plantar foot wound shows some decreased depth in the tunnel tracking towards his dorsal medial foot. He has not yet had the Doppler study that I ordered; it sounds like there is some confusion about the scheduling of the procedure. In addition, the MRI was denied and I have taken steps to appeal the denial. 11/24/2021: Since our last visit, Mr. Vanderloop was admitted to the hospital where an MRI suggested osteomyelitis. He was taken the operating room by podiatry. Bone biopsies were negative for osteomyelitis. They debrided his wounds and applied myriad matrix. He saw them last week and they removed his staples. He is here today to continue his wound healing process. T oday, both  of the dorsal foot/ankle wounds are substantially smaller. There is just a little eschar overlying the left sided wound and some eschar and slough on the right. The right plantar foot ulcer has the healthiest surface of granulation tissue that I have seen to date. A portion of the myriad matrix failed to take and was hanging loose. It appears that myriad morcells were placed into the tunnel closest to the dorsal portion of his foot. These have sloughed off. The left plantar foot ulcer is about the same size, but has a much healthier surface than in the past. Both plantar ulcers have callus and slough accumulation. 12/07/2021: Left dorsal foot/ankle wound is closed. The right dorsal foot/ankle wound is nearly closed and just has a small open area with some eschar and slough. The right plantar foot wound  has contracted quite a bit since our last visit. It has a healthy surface with just a little bit of slough accumulation and [place], [name] ([id number]) [id number] Page 8 of 21 periwound callus buildup. The left plantar foot wound is about the same size but the surface appears healthy. There is a little slough and periwound callus on this side, as well. 12/15/2021: Both dorsal foot/ankle wounds are closed. The right plantar foot wound is substantially smaller than at our last visit. The tunneling that was present has nearly closed. There is just a little bit of slough buildup. The left plantar foot wound is also a little bit smaller today. The surface is the healthiest that I have ever seen it. Light slough and periwound callus accumulation on this side. 12/22/2021: The dorsal ankle/foot wounds remain closed. The right plantar foot wound continues to contract. There is still a bit of depth at the lateral portion of the wound but the surface has a good granulated appearance. The wound on the left is about the same size, the central indented portion is still adherent. It also is  clean with good granulation tissue present. The periwound skin and callus are a bit macerated, however. 12/29/2021: Both ankle wounds remain closed. The right plantar foot wound is less than half the size that it was last week. There is no depth any longer and the surface has just a little bit of slough and periwound callus. On the left, the wound is not much smaller, but the surface is healthier with good granulation tissue. There is also a little slough and periwound callus buildup. 01/05/2022: The right plantar foot wound continues to contract and is once again about half the size as it was last week. There is just a little bit of surface slough and periwound callus. On the left, the depth of the wound has decreased and the diameter has started to contract. It is also very clean with just a little slough and biofilm present. 01/12/2022: The right plantar foot wound is smaller again today. There is just some periwound callus and slough accumulation. On the left, there is a fair amount of undermining and the surface is a little boggy, but no other significant change. 01/19/2022: The right plantar wound continues to contract. There is minimal periwound callus accumulation and just a light layer of slough on the surface. On the left, the surface looks better, but there is fairly significant undermining near the 11:00 portion of the wound, aiming towards his great toe. The skin overlying this area is very healthy and has a good fat layer present. No malodor or purulent drainage. 01/26/2022: The right sided wound continues to contract and has just a light layer of slough on the surface. Minimal periwound callus. On the left, he still has substantial undermining and the tissue surface is not as robust as I would like to see. No malodor or purulent drainage, but he did say he had a lot of serous drainage over the weekend. 02/02/2022: The right foot wound is about a quarter of the size that it was at his last  visit. There is just a little slough on the surface and some periwound callus. On the left, the undermining persists and the tissue is still more pale, but he does not seem to have had as much drainage over the last weekend. We are waiting on a wound VAC. 02/09/2022: His right foot has healed. He received the wound VAC, but did not bring it to clinic with him today.  The lateral aspect of the left foot wound has come in a little, but he still has substantial undermining on the medial and distal portion of the wound. Still with macerated periwound callus. 02/16/2022: The wound VAC was applied last Friday. T oday, there has been tremendous improvement in the surface of the wound bed. The undermined portion of the wound has come in a bit. There is still some overhanging dead skin. Overall the wound looks much better. 02/23/2022: No significant change in the overall wound dimensions, but the wound surface continues to improve and appears healthier and more robust. There is some periwound callus accumulation and overhanging skin. No concern for infection. 03/02/2022: Although the measurements taken in clinic today were unchanged, the visual appearance of the wound looks like it is smaller and the undermining/tunneling is filling with flesh. There is less periwound tissue maceration than usual. No malodor or purulent drainage. No concern for infection. 03/09/2022: The undermining has come in by couple of millimeters. The periwound is a little bit more macerated this week. No concern for infection. 12/13; substantial wound on the left plantar foot. We are using silver collagen and a wound VAC. 03/23/2022: The undermining continues to contract. The wound surface is a little bit dry. 03/30/2022: The undermining has essentially resolved. There is still some depth at the center of the wound. The wound surface has good beefy tissue and the moisture balance is better. 04/06/2022: The wound dimensions are about the  same, except that the depth is beginning to fill in. He has had more drainage this week, but there is no obvious concern for infection. 04/13/2022: He has built up a little bit of callus around the edges of the wound, creating some undermining. Otherwise the wound looks fairly unchanged. 04/20/2022: The wound bed tissue looks very healthy and robust. Light biofilm on the surface. There is some built up wet callus around the wound edges, but no undermining. 04/27/2022: The wound surface continues to look very healthy. Although the wound dimensions measured the same, it looks smaller to me. No real callus accumulation this week. Minimal slough on the surface. Electronic Signature(s) Signed: 04/27/2022 9:36:32 AM By: Duanne Guess MD FACS Entered By: Duanne Guess on 04/27/2022 09:36:32 -------------------------------------------------------------------------------- Physical Exam Details Patient Name: Date of Service: [name], [name] 04/27/2022 8:45 A M Medical Record Number: [id number] Patient Account Number: [id number] Date of Birth/Sex: Treating RN: [birth year] (36 y.o. M) Primary Care Provider: [name] Other Clinician: Referring Provider: Treating Provider/Extender: [name] in Treatment: [address] [name] ([id number]) [id number] Page 9 of 21 Constitutional . . . . no acute distress. Respiratory Normal work of breathing on room air. Notes 04/27/2022: The wound surface continues to look very healthy. Although the wound dimensions measured the same, it looks smaller to me. No real callus accumulation this week. Minimal slough on the surface. Electronic Signature(s) Signed: 04/27/2022 9:37:02 AM By: Duanne Guess MD FACS Entered By: Duanne Guess on 04/27/2022 09:37:01 -------------------------------------------------------------------------------- Physician Orders Details Patient Name: Date of Service: [name], [name] 04/27/2022 8:45 A M Medical Record Number: [id number] Patient Account Number: [id number] Date of Birth/Sex: Treating RN: [birth year] (36 y.o. 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 - Dr. Lady Gary - Room 1 with Bonita Quin Return A Wednesday 1/31 @ 0845 am Bathing/ Shower/ Hygiene May shower and wash wound with soap and water. Negative Presssure Wound Therapy Wound #3 Left,Lateral,Plantar Foot Wound Vac to wound continuously at 141mm/hg pressure Black Foam Edema Control - Lymphedema / SCD / Other Bilateral Lower Extremities Avoid standing for long periods of time. Exercise regularly Compression stocking or Garment 20-30 mm/Hg pressure to: - to both legs daily Off-Loading Open toe surgical shoe to: - Both feet Additional Orders / Instructions Follow Nutritious Diet Non Wound Condition pply the following to affected area as directed: - continue to apply foam donut or cushion to healed right plantar foot A Wound Treatment Wound #3 - Foot Wound Laterality: Plantar, Left, Lateral Cleanser: Soap and Water 3 x Per Week/30 Days Discharge Instructions: May shower and wash wound with dial antibacterial soap and water prior to dressing change. Peri-Wound Care: Skin Prep (Generic) 3 x Per Week/30 Days Discharge Instructions: Use skin prep as directed [place], [name] ([id number]) [id number] Page 10 of 21 Prim Dressing: Promogran Prisma Matrix, 4.34 (sq in) (silver collagen) (Generic) 3 x Per Week/30 Days ary Discharge Instructions: Moisten collagen with saline or  hydrogel Prim Dressing: VAC ary 3 x Per Week/30 Days Secondary Dressing: Zetuvit Plus 4x8 in (Generic) 3 x Per Week/30 Days Discharge Instructions: Apply over primary dressing as directed. Secured With: Elastic Bandage 4 inch (ACE bandage) (Generic) 3 x Per Week/30 Days Discharge Instructions: Secure with ACE bandage as directed. Secured With: 85M Medipore Scientist, research (life sciences) Surgical T 2x10 (in/yd) (Generic) 3 x Per Week/30 Days ape Discharge Instructions: Secure with tape as directed. Compression Wrap: Kerlix Roll 4.5x3.1 (in/yd) (Generic) 3 x Per Week/30 Days Discharge Instructions: Apply Kerlix and Coban compression as directed. Electronic Signature(s) Signed: 04/27/2022 10:27:15 AM By: Duanne Guess MD FACS Entered By: Duanne Guess on 04/27/2022 09:37:28 -------------------------------------------------------------------------------- Problem List Details Patient Name: Date of Service: [name], [name] 04/27/2022 8:45 A M Medical Record Number: [id number] Patient Account Number: [id number] Date of Birth/Sex: Treating RN: 05-Jan-1986 (36 y.o. [name] Primary Care Provider: [name] Other Clinician: Referring Provider: Treating Provider/Extender: [name] in Treatment: 130 Active Problems ICD-10 Encounter Code Description Active Date MDM Diagnosis L97.528 Non-pressure chronic ulcer of other part of left foot with other specified 10/24/2019 No Yes severity E11.621 Type 2 diabetes mellitus with foot ulcer 10/24/2019 No Yes Inactive Problems ICD-10 Code Description Active Date Inactive Date L97.518 Non-pressure chronic ulcer of other part of right foot with other specified severity 07/14/2020 07/14/2020 L97.518 Non-pressure chronic ulcer of other part of right foot with other specified severity 10/24/2019 10/24/2019 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, right ankle and foot 10/24/2019 10/24/2019 L97.318 Non-pressure chronic ulcer of right ankle  with other specified severity 08/10/2020 08/10/2020 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, left ankle and foot 10/24/2019 10/24/2019 L97.322 Non-pressure chronic ulcer of left ankle with fat layer exposed 09/29/2021 09/29/2021 [place], [name] ([id number]) [id number] Page 11 of 21 B95.62 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 10/24/2019 10/24/2019 classified elsewhere Resolved Problems Electronic Signature(s) Signed: 04/27/2022 9:34:12 AM By: Duanne Guess MD FACS Entered By: Duanne Guess on 04/27/2022 09:34:12 -------------------------------------------------------------------------------- Progress Note Details Patient Name: Date of Service: [name], [name] 04/27/2022 8:45 A M Medical Record Number: [id number] Patient Account Number: [id number] Date of Birth/Sex: Treating RN: 15-Jan-1986 (36 y.o. 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 He comes in for review of a wound over the plantar fifth metatarsal head extending into the lateral part of the foot. He was followed for this previously by his podiatrist Dr. Darreld Mclean. As the patient tells his story he went to see podiatry first for a swelling he developed on the lateral part of his fifth metatarsal head in May. He states this was "open" by podiatry and the area closed. He  was followed up in June and it was again opened callus removed and it closed promptly. There were plans being made for surgery on the fifth metatarsal head in June however his blood sugar was apparently too high for anesthesia. Apparently the area was debrided and opened again in June and it is never closed since. Looking over the records from podiatry I am really not able to follow this. It was clear when he was first seen it was before 5/14 at that point he already had a wound. By 5/17 the ulcer was resolved. I do not see anything about a procedure. On 5/28 noted to have pre-ulcerative moderate keratosis. X-ray noted 1/5 contracted toe and tailor's bunion and metatarsal deformity. On a visit date on 09/28/2018 the dorsal part of the left foot it healed and resolved. There was concern about swelling in his lower extremity he was sent to the ER.. As far as I can tell he was seen in the ER on 7/12 with an ulcer on his left foot. A DVT rule out of the left leg was negative. I do not think I have complete records from podiatry but I am not able to verify the procedures this patient states he had. He states after the last procedure the wound has never closed although I am not able to follow this in the records I have from podiatry. He has not had a recent x-ray The patient has been using Neosporin on the wound. He is wearing a Darco shoe. He is still very active up on his foot working and exercising. Past medical history; type 2 diabetes ketosis-prone, leg swelling with a negative DVT study in July. Non-smoker ABI in our clinic was 0.85 on the left 10/16; substantial wound on the plantar left fifth met head extending laterally almost to the dorsal fifth MTP. We have been using silver alginate we gave him a Darco forefoot off loader. An x-ray did not show evidence of osteomyelitis did note soft tissue emphysema which I think was due to gas tracking through an open wound. There is no doubt in my mind he  requires an MRI 10/23; MRI not booked until 3 November at the earliest this is largely due to his glucose sensor in the right arm. We have been using silver alginate. There has been an improvement 10/29; I am still not exactly sure when his MRI is booked for. He says it is the third but it is the 10th in epic. This definitely needs to be done. He is running a low-grade fever today but no other symptoms. No real improvement in the 1 02/26/2019 patient  presents today for a follow-up visit here in our clinic he is last been seen in the clinic on October 29. Subsequently we were working on getting MRI to evaluate and see what exactly was going on and where we would need to go from the standpoint of whether or not he had osteomyelitis and again what treatments were going be required. Subsequently the patient ended up being admitted to the hospital on 02/07/2019 and was discharged on 02/14/2019. This is a somewhat interesting admission with a discharge diagnosis of pneumonia due to COVID-19 although he was positive for COVID-19 when tested at the urgent care but negative x2 when he was actually in the hospital. With that being said he did have acute respiratory failure with hypoxia and it was noted he also have a left foot ulceration with osteomyelitis. With that being said he did require oxygen for his pneumonia and I level 4 L. He was placed on antivirals and steroids for the COVID-19. He was also transferred to the [facility] campus at one point. Nonetheless he did subsequently discharged home and since being home has done much better in that regard. The CT angiogram did not show any pulmonary embolism. With regard to the osteomyelitis the patient was placed on vancomycin and Zosyn while in the hospital but has been changed to Augmentin at discharge. It was also recommended that he follow- up with wound care and podiatry. Podiatry however wanted him to see [name] according to the patient prior to them doing  anything further. His hemoglobin A1c was 9.9 as noted in the hospital. Have an MRI of the left foot performed while in the hospital on 02/04/2019. 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 There is an overlying plantar open wound noted an abscess tracking back along the lateral aspect of the fifth metatarsal shaft. There is otherwise diffuse cellulitis and mild fasciitis without findings of polymyositis. The patient did have recently pneumonia secondary to COVID-19 I looked in the chart through epic and it does appear that the patient may need to have an additional x-ray [place], [name] ([id number]) [id number] Page 12 of 21 just to ensure everything is cleared and that he has no airspace disease prior to putting him into the chamber. 03/05/2019; patient was readmitted to the clinic last week. He was hospitalized twice for a viral upper respiratory tract infection from 11/1 through 11/4 and then 11/5 through 11/12 ultimately this turned out to be Covid pneumonitis. Although he was discharged on oxygen he is not using it. He says he feels fine. He has no exercise limitation no cough no sputum. His O2 sat in our clinic today was 100% on room air. 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 He received Vanco and Zosyn in the hospital and then was discharged on 2 weeks of Augmentin. I do not see any relevant cultures. He was supposed to follow-up with infectious disease but I do not see that he has an appointment. 12/8; patient saw Dr. Ronnie Derby of infectious disease last week. He felt that he had had adequate antibiotic therapy. He did not go to follow-up with Dr. Logan Bores of podiatry and I have again talked to him about the pros and cons of this. He does not want to consider a  ray amputation of this time. He is aware of  the risks of recurrence, migration etc. He started HBO today and tolerated this well. He can complete the Augmentin that I gave him last week. I have looked over the lab work that Dr. Effie Shyoleman ordered his C-reactive protein was 3.3 and his sedimentation rate was 17. The C-reactive protein is never really been measurably that high in this patient 12/15; not much change in the wound today however he has undermining along the lateral part of the foot again more extensively than last week. He has some rims of epithelialization. We have been using silver alginate. He is undergoing hyperbarics but did not dive today 12/18; in for his obligatory first total contact cast change. Unfortunately there was pus coming from the undermining area around his fifth metatarsal head. This was cultured but will preclude reapplication of a cast. He is seen in conjunction with HBO 12/24; patient had staph lugdunensis in the wound in the undermining area laterally last time. We put him on doxycycline which should have covered this. The wound looks better today. I am going to give him another week of doxycycline before reattempting the total contact cast 12/31; the patient is completing antibiotics. Hemorrhagic debris in the distal part of the wound with some undermining distally. He also had hyper granulation. Extensive debridement with a #5 curette. The infected area that was on the lateral part of the fifth met head is closed over. I do not think he needs any more antibiotics. Patient was seen prior to HBO. Preparations for a total contact cast were made in the cast will be placed post hyperbarics 04/11/19; once again the patient arrives today without complaint. He had been in a cast all week noted that he had heavy drainage this week. This resulted in large raised areas of macerated tissue around the wound 1/14; wound bed looks better slightly smaller. Hydrofera Blue has been  changing himself. He had a heavy drainage last week which caused a lot of maceration around the wound so I took him out of a total contact cast he says the drainage is actually better this week He is seen today in conjunction with HBO 1/21; returns to clinic. [place] was [place] in [place] a [place] 2 attending a funeral. He comes back in with the wound larger and with a large area of exposed bone. He had osteomyelitis and septic arthritis of the fifth left metatarsal head while he was in hospital. He received IV antibiotics in the hospital for a prolonged period of time then 3 weeks of Augmentin. Subsequently I gave him 2 weeks of doxycycline for more superficial wound infection. When I saw this last week the wound was smaller the surface of the wound looks satisfactory. 1/28; patient missed hyperbarics today. Bone biopsy I did last time showed Enterococcus faecalis and Staphylococcus lugdunensis . He has a wide area of exposed bone. We are going to use silver alginate as of today. I had another ethical discussion with the patient. 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 In this situation I think the likelihood of healing this is low. Therefore I have recommended a ray amputation and with the patient's agreement I have referred him to Dr. Victorino DikeHewitt. The other issue is that his compliance with hyperbarics has been minimal because of his work schedule and given his underlying decision I am going to stop this today READMISSION 10/24/2019 MRI 09/29/2019 left foot IMPRESSION: 1. Apparent skin ulceration inferior and lateral to the 5th metatarsal base with underlying heterogeneous  T2 signal and enhancement in the subcutaneous fat. Small peripherally enhancing fluid collections along the plantar and lateral aspects of the 5th metatarsal base suspicious for abscesses. 2. Interval amputation through the mid 5th metatarsal with nonspecific low-level marrow edema and  enhancement. Given the proximity to the adjacent soft tissue inflammatory changes, osteomyelitis cannot be excluded. 3. The additional bones appear unremarkable. MRI 09/29/2019 right foot IMPRESSION: 1. Soft tissue ulceration lateral to the 5th MTP joint. There is low-level T2 hyperintensity within the 4th and 5th metatarsal heads and adjacent proximal phalanges without abnormal T1 signal or cortical destruction. These findings are nonspecific and could be seen with early marrow edema, hyperemia or early osteomyelitis. No evidence of septic joint. 2. Mild tenosynovitis and synovial enhancement associated with the extensor digitorum tendons at the level of the midfoot. 3. Diffuse low-level muscular T2 hyperintensity and enhancement, most consistent with diabetic myopathy. LEFT FOOT BONE Methicillin resistant staphylococcus aureus Staphylococcus lugdunensis MIC MIC CIPROFLOXACIN >=8 RESISTANT Resistant <=0.5 SENSI... Sensitive CLINDAMYCIN <=0.25 SENS... Sensitive >=8 RESISTANT Resistant ERYTHROMYCIN >=8 RESISTANT Resistant >=8 RESISTANT Resistant GENTAMICIN <=0.5 SENSI... Sensitive <=0.5 SENSI... Sensitive Inducible Clindamycin NEGATIVE Sensitive NEGATIVE Sensitive OXACILLIN >=4 RESISTANT Resistant 2 SENSITIVE Sensitive RIFAMPIN <=0.5 SENSI... Sensitive <=0.5 SENSI... Sensitive TETRACYCLINE <=1 SENSITIVE Sensitive <=1 SENSITIVE Sensitive TRIMETH/SULFA <=10 SENSIT Sensitive <=10 SENSIT Sensitive ... Marland Kitchen... VANCOMYCIN 1 SENSITIVE Sensitive <=0.5 SENSI... Sensitive Right foot bone . Component 3 wk ago Specimen Description BONE [place], [name] ([id number]) [id number] Page 13 of 21 Special Requests RIGHT 4 METATARSAL SAMPLE B Gram Stain NO WBC SEEN NO ORGANISMS SEEN Culture RARE METHICILLIN RESISTANT STAPHYLOCOCCUS AUREUS NO ANAEROBES ISOLATED Performed at [facility], [address] [place], [place] [address] Report Status 10/08/2019 FINAL Organism  ID, Bacteria METHICILLIN RESISTANT STAPHYLOCOCCUS AUREUS Resulting Agency [facility] Susceptibility Methicillin resistant staphylococcus aureus MIC CIPROFLOXACIN >=8 RESISTANT Resistant CLINDAMYCIN <=0.25 SENS... Sensitive ERYTHROMYCIN >=8 RESISTANT Resistant GENTAMICIN <=0.5 SENSI... Sensitive Inducible Clindamycin NEGATIVE Sensitive OXACILLIN >=4 RESISTANT Resistant RIFAMPIN <=0.5 SENSI... Sensitive TETRACYCLINE <=1 SENSITIVE Sensitive TRIMETH/SULFA <=10 SENSIT Sensitive ... VANCOMYCIN 1 SENSITIVE Sensitive This is a patient we had in clinic earlier this year with a wound over his left fifth metatarsal head. 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 Victorino DikeHewitt on 05/16/2019. He seems to have developed problems with wounds on his bilateral feet in June 2021 although he may have had problems earlier than this. He was in an urgent care with a right foot ulcer on 09/26/2019 and given a course of doxycycline. This was apparently after having trouble getting into see orthopedics. He was seen by podiatry on 09/28/2019 noted to have bilateral lower extremity ulcers including the left lateral fifth metatarsal base and the right subfifth met head. It was noted that had purulent drainage at that time. He required hospitalization from 6/20 through 7/2. This was because of worsening right foot wounds. He underwent bilateral operative incision and drainage and bone biopsies bilaterally. Culture results are listed above. He has been referred back to clinic by Dr. Ardelle AntonWagoner of podiatry. He is also followed by Dr. Orvan Falconerampbell who saw him yesterday. He was discharged from hospital on Zyvox Flagyl and Levaquin and yesterday changed  to doxycycline Flagyl and Levaquin. His inflammatory markers on 6/26 showed a sedimentation  rate of 129 and a C-reactive protein of 5. This is improved to 14 and 1.3 respectively. This would indicate improvement. ABIs in our clinic today were 1.23 on the right and 1.20 on the left 11/01/2019 on evaluation today patient appears to be doing fairly well in regard to the wounds on his feet at this point. Fortunately there is no signs of active infection at this time. No fevers, chills, nausea, vomiting, or diarrhea. He currently is seeing infectious disease and still under their care at this point. Subsequently he also has both wounds which she has not been using collagen on as he did not receive that in his packaging he did not call us and let us know that. Apparently that just was missed on the order. Nonetheless we will get that straightened out today. 8/9-Patient returns for bilateral foot wounds, using Prisma with hydrogel moistened dressings, and the wounds appear stable. Patient using surgical shoes, avoiding much pressure or weightbearing as much as possible 8/16; patient has bilateral foot wounds. 1 on the right lateral foot proximally the other is on the left mid lateral foot. Both required debridement of callus and thick skin around the wounds. We have been using silver collagen 8/27; patient has bilateral lateral foot wounds. The area on the left substantially surrounded by callus and dry skin. This was removed from the wound edge. The underlying wound is small. The area on the right measured somewhat smaller today. 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 Unfortunately I did not update his antibiotics during today's visit. 9/10 I reviewed Dr. Blair Dolphin last notes he felt he had completed antibiotics his inflammatory markers were reasonably well controlled. He has a small wound on the lateral left foot and a tiny area on  the right which is just above closed. He is using Hydrofera Blue with border foam he has bilateral surgical shoes 9/24; 2 week f/u. doing well. right foot is closed. left foot still undermined. 10/14; right foot remains closed at the fifth met head. The area over the base of the left fifth metatarsal has a small open area but considerable undermining towards the plantar foot. Thick callus skin around this suggests an adequate pressure relief. We have talked about this. He says he is going to go back into his cam boot. I suggested a total contact cast he did not seem enamored with this suggestion 10/26; left foot base of the fifth metatarsal. Same condition as last time. He has skin over the area with an open wound however the skin is not adherent. He went to see Dr. Loreta Ave who did an x-ray and culture of his foot I have not reviewed the x-ray but the patient was not told anything. He is on doxycycline 11/11; since the patient was last here he was in the emergency room on 10/30 he was concerned about swelling in the left foot. They did not do any cultures or x-rays. They changed his antibiotics to cephalexin. Previous culture showed group B strep. The cephalexin is appropriate as doxycycline has less than predictable coverage. Arrives in clinic today with swelling over this area under the wound. He also has a new wound on the right fifth metatarsal head 11/18; the patient has a difficult wound on the lateral aspect of the left fifth metatarsal head. The wound was almost ballotable last week I opened it slightly expecting to see purulence however there was just bleeding. I cultured this this was  negative. X-ray unchanged. We are trying to get an MRI but I am not sure were going to be able to get this through his insurance. He also has an area on the right lateral fifth metatarsal head this looks healthier 12/3; the patient finally got our MRI. Surprisingly this did not show osteomyelitis. I did show the  soft tissue ulceration at the lateral plantar aspect of the fifth metatarsal base with a tiny residual 6 mm abscess overlying the superficial fascia I have tried to culture this area I have not been able to get this to grow anything. Nevertheless the protruding tissue looks aggravated. I suspect we should try to treat the underlying "abscess with broad-spectrum antibiotics. I am going to start him on Levaquin and Flagyl. He has much less edema in his legs and I am going to continue to wrap his legs and see him weekly 12/10. I started Levaquin and Flagyl on him last week. He just picked up the Flagyl apparently there was some delay. The worry is the wound on the left fifth metatarsal base which is substantial and worsening. His foot looks like he inverts at the ankle making this a weightbearing surface. Certainly no improvement in fact I think the measurements of this are somewhat worse. We have been using 12/17; he apparently just got the Levaquin yesterday this is 2 weeks after the fact. He has completed the Flagyl. The area over the left fifth metatarsal base still has protruding granulation tissue although it does not look quite as bad as it did some weeks ago. He has severe bilateral lymphedema although we have not been treating him for wounds on his legs this is definitely going to require compression. There was so much edema in the left I did not wish to put him in a total contact cast today. I am going to increase his compression from 3-4 layer. The area on the right lateral fifth met head actually look quite good and superficial. 12/23; patient arrived with callus on the right fifth met head and the substantial hyper granulated callused wound on the base of his fifth metatarsal. He says he is completing his Levaquin in 2 days but I do not think that adds up with what I gave him but I will have to double check this. We are using Hydrofera Blue on both areas. My plan is to put the left leg in a  cast the week after New Year's 04/06/2020; patient's wounds about the same. Right lateral fifth metatarsal head and left lateral foot over the base of the fifth metatarsal. There is undermining on the left lateral foot which I removed before application of total contact cast continuing with Hydrofera Blue new. Patient tells me he was seen by endocrinology today lab work was done [Dr. Kerr]. Also wondering whether he was referred to cardiology. I went over some lab work from previously does not have chronic renal failure certainly not nephrotic range proteinuria he does have very poorly controlled diabetes but this is not his most updated lab work. Hemoglobin A1c has been over 11 1/10; the patient had a considerable amount of leakage towards mid part of his left foot with macerated skin however the wound surface looks better the area on the right lateral fifth met head is better as well. I am going to change the dressing on the left foot under the total contact cast to silver alginate, continue [place], [name] ([id number]) [id number] Page 14 of 21 with Hydrofera Blue on the right. 1/20;  patient was in the total contact cast for 10 days. Considerable amount of drainage although the skin around the wound does not look too bad on the left foot. The area on the right fifth metatarsal head is closed. Our nursing staff reports large amount of drainage out of the left lateral foot wound 1/25; continues with copious amounts of drainage described by our intake staff. PCR culture I did last week showed E. coli and Enterococcus faecalis and low quantities. Multiple resistance genes documented including extended spectrum beta lactamase, MRSA, MRSE, quinolone, tetracycline. The wound is not quite as good this week as it was 5 days ago but about the same size 2/3; continues with copious amounts of malodorous drainage per our intake nurse. The PCR culture I did 2 weeks ago showed E. coli and  low quantities of Enterococcus. There were multiple resistance genes detected. I put Neosporin on him last week although this does not seem to have helped. The wound is slightly deeper today. Offloading continues to be an issue here although with the amount of drainage she has a total contact cast is just not going to work 2/10; moderate amount of drainage. Patient reports he cannot get his stocking on over the dressing. I told him we have to do that the nurse gave him suggestions on how to make this work. The wound is on the bottom and lateral part of his left foot. Is cultured predominantly grew low amounts of Enterococcus, E. coli and anaerobes. There were multiple resistance genes detected including extended spectrum beta lactamase, quinolone, tetracycline. I could not think of an easy oral combination to address this so for now I am going to do topical antibiotics [facility] I think the main agents here are vancomycin and an aminoglycoside. We have to be able to give him access to the wounds to get the topical antibiotic on 2/17; moderate amount of drainage this is unchanged. He has his [facility] topical antibiotic against the deep tissue culture organisms. He has been using this and changing the dressing daily. Silver alginate on the wound surface. 2/24; using [facility] antibiotic with silver alginate on the top. He had too much drainage for a total contact cast at one point although I think that is improving and I think in the next week or 2 it might be possible to replace a total contact cast I did not do this today. In general the wound surface looks healthy however he continues to have thick rims of skin and subcutaneous tissue around the wide area of the circumference which I debrided 06/04/2020 upon evaluation today patient appears to be doing well in regard to his wound. I do feel like he is showing signs of improvement. There is little bit of callus and dead tissue around the edges  of the wound as well as what appears to be a little bit of a sinus tract that is off to the side laterally I would perform debridement to clear that away today. 3/17; left lateral foot. The wound looks about the same as I remember. Not much depth surface looks healthy. No evidence of infection 3/25; left lateral foot. Wound surface looks about the same. Separating epithelium from the circumference. There really is no evidence of infection here however not making progress by my view 3/29; left lateral foot. Surface of the wound again looks reasonably healthy still thick skin and subcutaneous tissue around the wound margins. There is no evidence of infection. One of the concerns being brought up  by the nurses has again the amount of drainage vis--vis continued use of a total contact cast 4/5; left lateral foot at roughly the base of the fifth metatarsal. Nice healthy looking granulated tissue with rims of epithelialization. The overall wound measurements are not any better but the tissue looks healthy. The only concern is the amount of drainage although he has no surrounding maceration with what we have been doing recently to absorb fluid and protect his skin. He also has lymphedema. 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 He states that the kerlix/coban under the TCC rolled down and could not get it back up. He has been using an offloading scooter and has somehow hurt his right foot using this device. This happened last week. He states that the side of his right foot developed a blister and opened. The top of his foot also has a few small open wounds he thinks is due to his socks rubbing in his shoes. He has not been using any dressings to the wound. He  denies purulent drainage, fever/chills or erythema to the wounds. 4/22; patient presents for 1 week follow-up. He developed new wounds to the right foot that were evaluated at last clinic visit. He continues to have a total contact cast to the left leg and he reports no issues. He has been using silver collagen to the right foot wounds with no issues. He denies purulent drainage, fever/chills or erythema to the right foot wounds. He has no complaints today 4/25; patient presents for 1 week follow-up. He has a total contact cast of the left leg and reports no issues. He has been using silver alginate to the right foot wound. He denies purulent drainage, fever/chills or erythema to the right foot wounds. 5/2 patient presents for 1 week follow-up. T contact cast on the left. The wound which is on the base of the plantar foot at the base of the fifth metatarsal otal actually looks quite good and dimensions continue to gradually contract. HOWEVER the area on the right lateral fifth metatarsal head is much larger than what I remember from 2 weeks ago. Once more is he has significant levels of hypergranulation. Noteworthy that he had this same hyper granulated response on his wound on the left foot at one point in time. So much so that he I thought there was an underlying fluid collection. Based on this I think this just needs debridement. 5/9; the wound on the left actually continues to be gradually smaller with a healthy surface. Slight amount of drainage and maceration of the skin around but not too bad. However he has a large wound over the right fifth metatarsal head very much in the same configuration as his left foot wound was initially. I used silver nitrate to address the hyper granulated tissue no mechanical debridement 5/16; area on the left foot did not look as healthy this week deeper thick surrounding macerated skin and subcutaneous tissue. oo The area on the right foot fifth met head was  about the same oo The area on the right ankle that we identified last week is completely broken down into an open wound presumably a stocking rubbing issue 5/23; patient has been using a total contact cast to  the left side. He has been using silver alginate underneath. He has also been using silver alginate to the right foot wounds. He has no complaints today. He denies any signs of infection. 5/31; the left-sided wound looks some better measure smaller surface granulation looks better. We have been using silver alginate under the total contact cast oo The large area on his right fifth met head and right dorsal foot look about the same still using silver alginate 6/6; neither side is good as I was hoping although the surface area dimensions are better. A lot of maceration on his left and right foot around the wound edge. Area on the dorsal right foot looks better. He says he was traveling. I am not sure what does the amount of maceration around the plantar wounds may be drainage issues 6/13; in general the wound surfaces look quite good on both sides. Macerated skin and raised edges around the wound required debridement although in general especially on the left the surface area seems improved. oo The area on the right dorsal ankle is about the same I thought this would not be such a problem to close 6/20; not much change in either wound although the one on the right looks a little better. Both wounds have thick macerated edges to the skin requiring debridements. We have been using silver alginate. The area on the dorsal right ankle is still open I thought this would be closed. 6/28; patient comes in today with a marked deterioration in the right foot wound fifth met head. Wide area of exposed bone this is a drastic change from last time. The area on the left there we have been casting is stagnant. We have been using silver alginate in both wound areas. 7/5; bone culture I did for PCR last time  was positive for Pseudomonas, group B strep, Enterococcus and Staph aureus. There was no suggestion of methicillin resistance or ampicillin resistant genes. This was resistant to tetracycline however He comes into the clinic today with the area over his right plantar fifth metatarsal head which had been doing so well 2 weeks ago completely necrotic feeling bone. I do not know that this is going to be salvageable. The left foot wound is certainly no smaller but it has a better surface and is superficial. 7/8; patient called in this morning to say that his total contact cast was rubbing against his foot. He states he is doing fine overall. He denies signs of infection. 7/12; continued deterioration in the wound over the right fifth metatarsal head crumbling bone. This is not going to be salvageable. The patient agrees and wants to be referred to Dr. Victorino Dike which we will attempt to arrange as soon as possible. I am going to continue him on antibiotics as long as that takes so I will renew those today. The area on the left foot which is the base of the fifth metatarsal continues to look somewhat better. Healthy looking tissue no depth no debridement is necessary here. 7/20; the patient was kindly seen by Dr. Victorino Dike of orthopedics on 10/19/2020. He agreed that he needed a ray amputation on the right and he said he would [place], [name] ([id number]) [id number] Page 15 of 21 have a look at the fourth as well while he was intraoperative. Towards this end we have taken him out of the total contact cast on the left we will put him in a wrap with Hydrofera Blue. As I understand things surgery is planned for 7/21 7/27; patient  had his surgery last Thursday. 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 Apparently everything went well we did not still disturb that today The area on the left foot actually looks quite good. He has been much less mobile which probably explains this he did not  seem to do well in the total contact cast secondary to drainage and maceration I think. 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 Fortunately there is no evidence of active infection at this time. No fevers, chills, nausea, vomiting, or diarrhea. Overall I think that he is actually doing extremely well. Nonetheless I do believe that he is staying off of this more following the surgery in his right foot that is the reason the left is doing so great. 8/16; left plantar foot wound. This looks smaller than the last time I saw this he is using Hydrofera Blue. The surgical wound on the right foot is being followed by Dr. Victorino Dike we did not look at this today. He has surgical shoes on both feet 8/23; left plantar foot wound not as good this week. Surrounding macerated skin and subcutaneous tissue everything looks moist and wet. I do not think he is offloading this adequately. He is using a surgical shoe Apparently the right foot surgical wound is not open although I did not check his foot 8/31; left plantar foot lateral aspect. Much improved this week. He has no maceration. Some improvement in the surface area of the wound but most impressively the depth is come in we are using silver alginate. The patient is a Veterinary surgeon. He is asked that we write him a letter so he can go back to work. I have also tried to see if we can write something that will allow him to limit the amount of time that he is on his foot at work. 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 Hopefully the latter can be avoided 9/6; I believe the patient missed an appointment last week. He arrives in today with a wound looking roughly the same certainly no better. Undermining laterally and also inferiorly. We used molecuLight today in  training with the patient's permission.. We are using silver alginate 9/21 wound is measuring bigger this week although this may have to do with the aggressive circumferential debridement last week in response to the blush fluorescence on the MolecuLight. Culture I did last week showed significant MSSA and E. coli. I put him on Augmentin but he has not started it yet. We are also going to send this for compounded antibiotics at [facility]. There is no evidence of systemic infection 9/29; silver alginate. His Keystone arrived. He is completing Augmentin in 2 days. Offloading in a cam boot. Moderate drainage per our intake staff 10/5; using silver alginate. He has been using his [place]. He has completed his Augmentin. Per our intake nurse still a lot of drainage, far too much to consider a total contact cast. Wound measures about the same. He had the same undermining area that I defined last week from a roughly 11-3. I remove this today 10/12; using silver alginate he is using the Blende. He comes in for a nurse visit hence we are applying Jodie Echevaria twice a week. Measuring slightly better today and less notable drainage. Extensive debridement of the wound edge last time  10/18; using topical Keystone and silver alginate and a soft cast. Wound measurements about the same. Drainage was through his soft cast. We are changing this twice a week Tuesdays and Friday 10/25; comes in with moderate drainage. Still using Keystone silver alginate and a soft cast. Wound dimensions completely the same.He has a lot of edema in the left leg he has lymphedema. Asking for us to consider wrapping him as he cannot get his stocking on over the soft cast 11/2; comes in with moderate to large drainage slightly smaller in terms of width we have been using [facility]. His wound looks satisfactory but not much improvement 11/4; patient presents today for obligatory cast change. Has no issues or complaints today. He denies signs  of infection. 11/9; patient traveled this weekend to [place], was on the cast quite a bit. Staining of the cast with black material from his walking boot. Drainage was not quite as bad as we feared. Using silver alginate and Keystone 11/16; we do not have size for cast therefore we have been putting a soft cast on him since the change on Friday. Still a significant amount of drainage necessitating changing twice a week. We have been using the Keystone at cast changes either hard or soft as well as silver alginate Comes in the clinic with things actually looking fairly good improvement in width. He says his offloading is about the same 02/24/2021 upon evaluation today patient actually comes back in and is doing excellent in regard to his foot ulcer this is significantly smaller even compared to the last visit. The soft cast seems to have done extremely well for him which is great news. I do not see any signs of infection minimal debridement will be needed today. 11/30; left lateral foot much improved half a centimeter improvement in surface area. No evidence of infection. He seems to be doing better with the soft cast in the TCC therefore we will continue with this. He comes back in later in the week for a change with the nurses. This is due to drainage 12/6; no improvement in dimensions. Under illumination some debris on the surface we have been using silver alginate, soft cast. If there is anything optimistic here he seems to have have less drainage 12/13. Dimensions are improved both length and width and slightly in depth. Appears to be quite healthy today. Raised edges of this thick skin and callus around the edges however. He is in a soft cast were bringing him back once for a change on Friday. Drainage is better 12/20. Dimensions are improved. He still has raised edges of thick skin and callus around the edges. We are using a soft cast 12/28; comes in today with thick callus around the wound. Using  silver under alginate under a soft cast. I do not think there is much improvement in any measurement 2023 04/06/2021; patient was put in a total contact cast. Unfortunately not much change in surface area 1/10; not much different still thick callus and skin around the edge in spite of the total contact cast. This was just debrided last week we have been [facility] comp[facility] and silver alginate under a total contact cast 1/18 the patient's wound on the left side is doing nicely. smaller HOWEVER he comes in today with a wound on the right foot laterally. blister most likely serosangquenous drainage 1/24; the patient continues to do well in terms of the plantar left foot which is continued to contract using silver alginate under  the total contact cast HOWEVER the right lateral foot is bigger with denuded skin around the edges. I used pickups and a #15 scalpel to remove this this looks like the remanence of a large blister. Cannot rule out infection. Culture in this area I did last week showed Staphylococcus lugdunensis few colonies. I am going to try to address this with his Jodie Echevaria antibiotic that is done so well on the left having linezolid and this should cover the staph 2/1; the patient's wound on his left foot which was the original plantar foot wound thick skin and eschar around the edges even in the total contact cast but the wound surface does not look too bad The real problem is on how his right lateral foot at roughly the base of the fifth metatarsal. The wound is completely necrotic more worrisome than that there is swelling around the edges of this. We have been using silver alginate on both wounds and Keystone on the right foot. Unfortunately I think he is going to require systemic antibiotics while we await cultures. He did not get the x-ray done that we ordered last week [lost the prescription 2/7; disappointingly in the area on the left foot which we are treating with  a total contact cast is still not closed although it is much smaller. He continues to have a lot of callus around the wound edge. -Right lateral foot culture I did last week was negative x-ray also negative for osteomyelitis. 2/15: TCC silver alginate on the left and silver alginate on the right lateral. No real improvement in either area [place], [name] ([id number]) [id number] Page 16 of 21 05/26/2021: T oday, the wounds are roughly the same size as at his previous visit, post-debridement. He continues to endorse fairly substantial drainage, particularly on the right. He has been in a total contact cast on the left. There is still some callus surrounding this lesion. On the right, the periwound skin is quite macerated, along with surrounding callus. The center of the right-sided wound also has some dark, densely adherent material, which is very difficult to remove. 06/02/2021: Today, both wounds are slightly smaller. He has been using zinc oxide ointment around the right ulcer and the degree of maceration has improved markedly. There continues to be an area of nonviable tissue in the center of the right sided ulcer. The left-sided wound, which has been in the total contact cast. Appears clean and the degree of callus around it is less than previously. 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 He had to take the stairs and both wounds have increased in size. The left foot, which has been in a total contact cast, has developed a tunnel tracking to the lateral aspect of his foot. The nonviable tissue in the center of the right-sided ulcer remains recalcitrant to debridement. There is significant undermining surrounding the entirety of the left sided wound. 06/16/2021: The elevator at school has been fixed and the patient has been able to avoid putting as much weight on his wounds over the past week. We converted  the left foot wound into a single lesion today, but despite this, the wound is actually smaller. The base is healthy with limited periwound callus. On the right, the central necrotic area is still present. He continues to be quite macerated around the right sided wound, despite applying barrier cream. This does, however, have the benefit of softening the callus to make it more easily removable.  06/23/2021: Today, the left wound is smaller. The lateral aspect that had opened up previously is now closed. The wound base has a healthy bed of granulation tissue and minimal slough. Unfortunately, on the right, the wound is larger and continues to be fairly macerated. He has also reopened the wound at his right ankle. He thinks this is due to the gait he has adopted secondary to his total contact cast and boot. 06/30/2021: T oday, both wounds are a little bit larger. The lateral aspect on the left has remained closed. He continues to have significant periwound maceration. The culture that I took from the right sided wound grew a population of bacteria that is not covered by his current [facility] antibiotic. The center of the right- sided wound continues to appear necrotic with nonviable fat. It probes deeper today, but does not reach bone. 07/07/2021: The periwound maceration is a little bit less today. The right lateral foot wound has some areas that appear more viable and the necrotic center also looks a little bit better. The wound on the dorsal surface of his right foot near the ankle is contracting and the surface appears healthy. The left plantar wound surface looks healthy, but there is some new undermining on the medial portion. He did get his new Keystone antibiotic and began applying that to the right foot wound on Saturday. 07/14/2021: The intake nurse reported substantial drainage from his wounds, but the periwound skin actually looks better than is typical for him. The wound on the dorsal surface of  his right foot near the ankle is smaller and just has a small open area underneath some dried eschar. The left plantar wound surface looks healthy and there has been no significant accumulation of callus. The right lateral foot wound looks quite a bit better, with the central portion, which typically appears necrotic, looking more viable albeit pale. 07/22/2021: His left foot is extremely macerated today. The wound is about the same size. The wound on the dorsal surface of his right foot near the ankle had closed, but he traumatized it removing the dressing and there is a tiny skin tear in that location. The right lateral foot wound is bigger, but the surface appears healthy. 07/30/2021: The wound on the dorsal surface of his right foot near the ankle is closed. The right lateral foot wound again is a little bit bigger due to some undermining. The periwound skin is in better condition, however. He has been applying zinc oxide. The wound surface is a little bit dry today. On the left, he does not have the substantial maceration that we frequently see. The wound itself is smaller and has a clean surface. 08/06/2021: Both wounds seem to have deteriorated over the past week. The right lateral foot wound has a dry surface but the periwound is boggy.. Overall wound dimensions are about the same. On the left, the wound is about the same size, but there is more undermining present underneath periwound callus. 08/13/2021: The right sided wound looks about the same, but on the left there has been substantial deterioration. The undermining continues to extend under periwound callus. Once this was removed, substantial extension of the wound was present. There is no odor or purulent drainage but clearly the wounds have broken down. 08/20/2021: The wounds look about the same today. He has been out of his total contact cast and has just been changing the dressings using topical Keystone with PolyMem Ag, Kerlix and Ace  bandages. The wound on the  top of his right ankle has reopened but this is quite small. There was a little bit of purulent material that I expressed when examining this wound. 08/24/2021: After the aggressive debridement I performed at his last visit, the wounds actually look a little bit better today. They are smaller with the exception of the wound on the top of his right ankle which is a little bit bigger as some more skin pulled off when he was changing his dressing. We are using topical Keystone with PolyMem Ag Kerlix and Ace bandages. 09/02/2021: There has been really no change to any of his wounds. 09/16/2021: The patient was hospitalized last week with nausea, vomiting, and dehydration. He says that while he was in the hospital, his wounds were not really addressed properly. T oday, both plantar foot wounds are larger and the periwound skin is macerated. The wound on the dorsum of his right foot has a scab on the top. The right foot now has a crater where previously he had had nonviable fat. It looks as though this simply died and fell out. The periwound callus is wet. 09/24/2021: His wounds have deteriorated somewhat since his last visit. The wound on the dorsum of his right foot near his ankle is larger and has more nonviable tissue present. The crater in his right foot is even deeper; I cannot quite palpate or probe to bone but I am sure it is close. The wound on his left plantar foot has an odd boggy area in the center that almost feels as though it has fluid within it. He has run out of his topical Keystone antibiotic. We are using silver alginate on his wounds. 09/29/2021: He has developed a new wound on the dorsum of his left foot near his ankle. He says he thinks his wrapping is rubbing in that site. I would concur with this as the wound on his right ankle is larger. The left foot looks about the same. The right foot has the crater that was present last week. No significant slough  accumulation, but his foot remains quite swollen and warm despite oral antibiotic therapy. 10/08/2021: All of his wounds look about the same as last week. He did not start his oral antibiotics that are prescribed until just a couple of days ago; his Jodie Echevaria compounded antibiotics formula has been changed and he is awaiting delivery of the new recipe. His MRI that was scheduled for earlier this week was canceled as no prior authorization had been obtained; unfortunately the tech responsible sent an email to my old [facility] email, which I no longer use nor have access to. 10/18/2021: The wounds on his bilateral dorsal feet near the ankles are both improved. They are smaller and have just some eschar and slough buildup. The left plantar wound has a fair amount of undermining, but the surface is clean. There is some periwound callus accumulation. On the right plantar foot, there is nonviable fat leading to a deep tunnel that tracks towards his dorsal medial foot. There is periwound callus and slough accumulation, as well. His right foot and leg remain swollen as compared to the left. 10/25/2021: The wounds on his bilateral dorsal feet and at the ankles have broken down somewhat. They are little bit larger than last week. The left plantar wound continues to undermine laterally but the surface is clean. The right plantar foot wound shows some decreased depth in the tunnel tracking towards his dorsal medial foot. He has not yet had the Doppler  study that I ordered; it sounds like there is some confusion about the scheduling of the procedure. In addition, the MRI was denied and I have taken steps to appeal the denial. 11/24/2021: Since our last visit, Mr. Merced was admitted to the hospital where an MRI suggested osteomyelitis. He was taken the operating room by podiatry. Bone biopsies were negative for osteomyelitis. They debrided his wounds and applied myriad matrix. He saw them last week and they removed  his [place], [name] ([id number]) [id number] Page 17 of 21 staples. He is here today to continue his wound healing process. T oday, both of the dorsal foot/ankle wounds are substantially smaller. There is just a little eschar overlying the left sided wound and some eschar and slough on the right. The right plantar foot ulcer has the healthiest surface of granulation tissue that I have seen to date. A portion of the myriad matrix failed to take and was hanging loose. It appears that myriad morcells were placed into the tunnel closest to the dorsal portion of his foot. These have sloughed off. The left plantar foot ulcer is about the same size, but has a much healthier surface than in the past. Both plantar ulcers have callus and slough accumulation. 12/07/2021: Left dorsal foot/ankle wound is closed. The right dorsal foot/ankle wound is nearly closed and just has a small open area with some eschar and slough. The right plantar foot wound has contracted quite a bit since our last visit. It has a healthy surface with just a little bit of slough accumulation and periwound callus buildup. The left plantar foot wound is about the same size but the surface appears healthy. There is a little slough and periwound callus on this side, as well. 12/15/2021: Both dorsal foot/ankle wounds are closed. The right plantar foot wound is substantially smaller than at our last visit. The tunneling that was present has nearly closed. There is just a little bit of slough buildup. The left plantar foot wound is also a little bit smaller today. The surface is the healthiest that I have ever seen it. Light slough and periwound callus accumulation on this side. 12/22/2021: The dorsal ankle/foot wounds remain closed. The right plantar foot wound continues to contract. There is still a bit of depth at the lateral portion of the wound but the surface has a good granulated appearance. The wound on the left is  about the same size, the central indented portion is still adherent. It also is clean with good granulation tissue present. The periwound skin and callus are a bit macerated, however. 12/29/2021: Both ankle wounds remain closed. The right plantar foot wound is less than half the size that it was last week. There is no depth any longer and the surface has just a little bit of slough and periwound callus. On the left, the wound is not much smaller, but the surface is healthier with good granulation tissue. There is also a little slough and periwound callus buildup. 01/05/2022: The right plantar foot wound continues to contract and is once again about half the size as it was last week. There is just a little bit of surface slough and periwound callus. On the left, the depth of the wound has decreased and the diameter has started to contract. It is also very clean with just a little slough and biofilm present. 01/12/2022: The right plantar foot wound is smaller again today. There is just some periwound callus and slough accumulation. On the left, there is a fair amount  of undermining and the surface is a little boggy, but no other significant change. 01/19/2022: The right plantar wound continues to contract. There is minimal periwound callus accumulation and just a light layer of slough on the surface. On the left, the surface looks better, but there is fairly significant undermining near the 11:00 portion of the wound, aiming towards his great toe. The skin overlying this area is very healthy and has a good fat layer present. No malodor or purulent drainage. 01/26/2022: The right sided wound continues to contract and has just a light layer of slough on the surface. Minimal periwound callus. On the left, he still has substantial undermining and the tissue surface is not as robust as I would like to see. No malodor or purulent drainage, but he did say he had a lot of serous drainage over the  weekend. 02/02/2022: The right foot wound is about a quarter of the size that it was at his last visit. There is just a little slough on the surface and some periwound callus. On the left, the undermining persists and the tissue is still more pale, but he does not seem to have had as much drainage over the last weekend. We are waiting on a wound VAC. 02/09/2022: His right foot has healed. He received the wound VAC, but did not bring it to clinic with him today. The lateral aspect of the left foot wound has come in a little, but he still has substantial undermining on the medial and distal portion of the wound. Still with macerated periwound callus. 02/16/2022: The wound VAC was applied last Friday. T oday, there has been tremendous improvement in the surface of the wound bed. The undermined portion of the wound has come in a bit. There is still some overhanging dead skin. Overall the wound looks much better. 02/23/2022: No significant change in the overall wound dimensions, but the wound surface continues to improve and appears healthier and more robust. There is some periwound callus accumulation and overhanging skin. No concern for infection. 03/02/2022: Although the measurements taken in clinic today were unchanged, the visual appearance of the wound looks like it is smaller and the undermining/tunneling is filling with flesh. There is less periwound tissue maceration than usual. No malodor or purulent drainage. No concern for infection. 03/09/2022: The undermining has come in by couple of millimeters. The periwound is a little bit more macerated this week. No concern for infection. 12/13; substantial wound on the left plantar foot. We are using silver collagen and a wound VAC. 03/23/2022: The undermining continues to contract. The wound surface is a little bit dry. 03/30/2022: The undermining has essentially resolved. There is still some depth at the center of the wound. The wound surface has good  beefy tissue and the moisture balance is better. 04/06/2022: The wound dimensions are about the same, except that the depth is beginning to fill in. He has had more drainage this week, but there is no obvious concern for infection. 04/13/2022: He has built up a little bit of callus around the edges of the wound, creating some undermining. Otherwise the wound looks fairly unchanged. 04/20/2022: The wound bed tissue looks very healthy and robust. Light biofilm on the surface. There is some built up wet callus around the wound edges, but no undermining. 04/27/2022: The wound surface continues to look very healthy. Although the wound dimensions measured the same, it looks smaller to me. No real callus accumulation this week. Minimal slough on the surface. Patient History  Information obtained from Patient. Family History No family history of Cancer, Diabetes, Hereditary Spherocytosis, Hypertension, Kidney Disease, Lung Disease, Seizures, Stroke, Thyroid Problems, Tuberculosis. Social History Never smoker, Marital Status - Single, Alcohol Use - Rarely, Drug Use - No History, Caffeine Use - Never. Medical History Eyes Denies history of Cataracts, Glaucoma, Optic Neuritis Ear/Nose/Mouth/Throat Denies history of Chronic sinus problems/congestion, Middle ear problems Hematologic/Lymphatic Denies history of Anemia, Hemophilia, Human Immunodeficiency Virus, Lymphedema, Sickle Cell Disease [place], [name] ([id number]) [id number] Page 18 of 21 Respiratory Denies history of Aspiration, Asthma, Chronic Obstructive Pulmonary Disease (COPD), Pneumothorax, Sleep Apnea, Tuberculosis Cardiovascular Denies history of Angina, Arrhythmia, Congestive Heart Failure, Coronary Artery Disease, Deep Vein Thrombosis, Hypertension, Hypotension, Myocardial Infarction, Peripheral Arterial Disease, Peripheral Venous Disease, Phlebitis, Vasculitis Gastrointestinal Denies history of Cirrhosis ,  Colitis, Crohnoos, Hepatitis A, Hepatitis B, Hepatitis C Endocrine Patient has history of Type II Diabetes Denies history of Type I Diabetes Immunological Denies history of Lupus Erythematosus, Raynaudoos, Scleroderma Integumentary (Skin) Denies history of History of Burn Musculoskeletal Denies history of Gout, Rheumatoid Arthritis, Osteoarthritis, Osteomyelitis Neurologic Denies history of Dementia, Neuropathy, Quadriplegia, Paraplegia, Seizure Disorder Oncologic Denies history of Received Chemotherapy, Received Radiation Psychiatric Denies history of Anorexia/bulimia, Confinement Anxiety Hospitalization/Surgery History - 11/1-11/06/2018- sepsis foot infection. - 11/4-11/5 02 sats low respiratory distress. Objective Constitutional no acute distress. Vitals Time Taken: 9:01 AM, Height: 77 in, Weight: 280 lbs, BMI: 33.2, Temperature: 97.7 F, Pulse: 92 bpm, Respiratory Rate: 20 breaths/min, Blood Pressure: 139/80 mmHg, Capillary Blood Glucose: 89 mg/dl. General Notes: glucose per pt report this am Respiratory Normal work of breathing on room air. General Notes: 04/27/2022: The wound surface continues to look very healthy. Although the wound dimensions measured the same, it looks smaller to me. No real callus accumulation this week. Minimal slough on the surface. Integumentary (Hair, Skin) Wound #3 status is Open. Original cause of wound was Trauma. The date acquired was: 10/02/2019. The wound has been in treatment 130 weeks. The wound is located on the [place]. The wound measures 3.2cm length x 3.9cm width x 1.8cm depth; 9.802cm^2 area and 17.643cm^3 volume. There is Fat Layer (Subcutaneous Tissue) exposed. There is no tunneling or undermining noted. There is a medium amount of serosanguineous drainage noted. The wound margin is distinct with the outline attached to the wound base. There is large (67-100%) red granulation within the wound bed. There is a small  (1-33%) amount of necrotic tissue within the wound bed including Adherent Slough. The periwound skin appearance had no abnormalities noted for moisture. The periwound skin appearance had no abnormalities noted for color. The periwound skin appearance exhibited: Callus. Periwound temperature was noted as No Abnormality. 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 There was a Selective/Open Wound Skin/Epidermis Debridement with a total area of 12.48 sq cm performed by [name], MD. With the following instrument(s): Curette to remove Non-Viable tissue/material. Material removed includes Callus, Skin: Epidermis, and Biofilm. No specimens were taken. A time out was conducted at 09:20, prior to the start of the procedure. A Minimum amount of bleeding was controlled with Pressure. The procedure was tolerated well with a pain level of 0 throughout and a pain level of 0 following the procedure. Post Debridement Measurements: 3.2cm length x  2.9cm width x 1.8cm depth; 13.119cm^3 volume. Character of Wound/Ulcer Post Debridement is improved. Severity of Tissue Post Debridement is: Fat layer exposed. Post procedure Diagnosis Wound #3: Same as Pre-Procedure [place], [name] ([id number]) [id number] Page 19 of 21 General Notes: Scribed for Dr. Lady Gary by Zenaida Deed, RN. Plan Follow-up Appointments: Return Appointment in 1 week. - Dr. Lady Gary - Room 1 with [facility] Wednesday 1/31 @ 0845 am Bathing/ Shower/ Hygiene: May shower and wash wound with soap and water. Negative Presssure Wound Therapy: Wound #3 Left,Lateral,Plantar Foot: Wound Vac to wound  continuously at 157mm/hg pressure Black Foam Edema Control - Lymphedema / SCD / Other: Avoid standing for long periods of time. Exercise regularly Compression stocking or Garment 20-30 mm/Hg pressure to: - to both legs daily Off-Loading: Open toe surgical shoe to: - Both feet Additional Orders / Instructions: Follow Nutritious Diet Non Wound Condition: Apply the following to affected area as directed: - continue to apply foam donut or cushion to healed right plantar foot WOUND #3: - Foot Wound Laterality: Plantar, Left, Lateral Cleanser: Soap and Water 3 x Per Week/30 Days Discharge Instructions: May shower and wash wound with dial antibacterial soap and water prior to dressing change. Peri-Wound Care: Skin Prep (Generic) 3 x Per Week/30 Days Discharge Instructions: Use skin prep as directed Prim Dressing: Promogran Prisma Matrix, 4.34 (sq in) (silver collagen) (Generic) 3 x Per Week/30 Days ary Discharge Instructions: Moisten collagen with saline or hydrogel Prim Dressing: VAC 3 x Per Week/30 Days ary Secondary Dressing: Zetuvit Plus 4x8 in (Generic) 3 x Per Week/30 Days Discharge Instructions: Apply over primary dressing as directed. Secured With: Elastic Bandage 4 inch (ACE bandage) (Generic) 3 x Per Week/30 Days Discharge Instructions: Secure with ACE bandage as directed. Secured With: 91M Medipore Scientist, research (life sciences) Surgical T 2x10 (in/yd) (Generic) 3 x Per Week/30 Days ape Discharge Instructions: Secure with tape as directed. Com pression Wrap: Kerlix Roll 4.5x3.1 (in/yd) (Generic) 3 x Per Week/30 Days Discharge Instructions: Apply Kerlix and Coban compression as directed. 04/27/2022: The wound surface continues to look very healthy. Although the wound dimensions measured the same, it looks smaller to me. No real callus accumulation this week. Minimal slough on the surface. I used a curette to debride the slough from the wound surface. We will continue Prisma silver collagen and negative  pressure wound therapy. Although the wound is not quite ready for it yet, I think ultimately he will benefit from skin substitute application in conjunction with the wound VAC so we will run his insurance for [employer]. Follow-up in 1 week. Electronic Signature(s) Signed: 04/27/2022 9:38:14 AM By: Duanne Guess MD FACS Entered By: Duanne Guess on 04/27/2022 09:38:14 -------------------------------------------------------------------------------- HxROS Details Patient Name: Date of Service: [name], [name] 04/27/2022 8:45 A M Medical Record Number: [id number] Patient Account Number: [id number] Date of Birth/Sex: Treating RN: [birth year] (36 y.o. M) Primary Care Provider: [name] Other Clinician: Referring Provider: Treating Provider/Extender: [name] in Treatment: 130 Information Obtained From Patient Eyes Medical History: Negative for: Cataracts; Glaucoma; Optic Neuritis [place], [name] ([id number]) [id number] Page 20 of 21 Ear/Nose/Mouth/Throat Medical History: Negative for: Chronic sinus problems/congestion; Middle ear problems Hematologic/Lymphatic Medical History: Negative for: Anemia; Hemophilia; Human Immunodeficiency Virus; Lymphedema; Sickle Cell Disease Respiratory Medical History: Negative for: Aspiration; Asthma; Chronic Obstructive Pulmonary Disease (COPD); Pneumothorax; Sleep Apnea; Tuberculosis Cardiovascular Medical History: Negative for: Angina; Arrhythmia; Congestive Heart Failure; Coronary Artery Disease; Deep Vein Thrombosis; Hypertension; Hypotension; Myocardial Infarction; Peripheral Arterial Disease; Peripheral Venous Disease; Phlebitis; Vasculitis  Gastrointestinal Medical History: Negative for: Cirrhosis ; Colitis; Crohns; Hepatitis A; Hepatitis B; Hepatitis C Endocrine Medical History: Positive for: Type II Diabetes Negative for: Type I Diabetes Time with diabetes: 8 Treated with:  Insulin Blood sugar tested every day: No Immunological Medical History: Negative for: Lupus Erythematosus; Raynauds; Scleroderma Integumentary (Skin) Medical History: Negative for: History of Burn Musculoskeletal Medical History: Negative for: Gout; Rheumatoid Arthritis; Osteoarthritis; Osteomyelitis Neurologic Medical History: Negative for: Dementia; Neuropathy; Quadriplegia; Paraplegia; Seizure Disorder Oncologic Medical History: Negative for: Received Chemotherapy; Received Radiation Psychiatric Medical History: Negative for: Anorexia/bulimia; Confinement Anxiety Immunizations Pneumococcal Vaccine: Received Pneumococcal Vaccination: No Implantable Devices None Hospitalization / Surgery History Type of Hospitalization/Surgery 11/1-11/06/2018- sepsis foot infection 11/4-11/5 02 sats low respiratory distress [place], [name] ([id number]) [id number] Page 21 of 75 Family and Social History Cancer: No; Diabetes: No; Hereditary Spherocytosis: No; Hypertension: No; Kidney Disease: No; Lung Disease: No; Seizures: No; Stroke: No; Thyroid Problems: No; Tuberculosis: No; Never smoker; Marital Status - Single; Alcohol Use: Rarely; Drug Use: No History; Caffeine Use: Never; Financial Concerns: No; Food, Clothing or Shelter Needs: No; Support System Lacking: No; Transportation Concerns: No Electronic Signature(s) Signed: 04/27/2022 10:27:15 AM By: Duanne Guess MD FACS Entered By: Duanne Guess on 04/27/2022 09:36:39 -------------------------------------------------------------------------------- SuperBill Details Patient Name: Date of Service: [name], [name] 04/27/2022 Medical Record Number: [id number] Patient Account Number: [id number] Date of Birth/Sex: Treating RN: [birth year] (36 y.o. M) Primary Care Provider: [name] Other Clinician: Referring Provider: Treating Provider/Extender: [name] in Treatment:  130 Diagnosis Coding ICD-10 Codes Code Description [id number] Non-pressure chronic ulcer of other part of left foot with other specified severity E11.621 Type 2 diabetes mellitus with foot ulcer Facility Procedures : CPT4 Code: [id number] Description: 7597 - DEBRIDE WOUND 1ST 20 SQ CM OR < ICD-10 Diagnosis Description L97.528 Non-pressure chronic ulcer of other part of left foot with other specified severi Modifier: ty Quantity: 1 Physician Procedures : CPT4 Code Description Modifier [id number] - WC PHYS LEVEL 4 - EST PT 25 ICD-10 Diagnosis Description L97.528 Non-pressure chronic ulcer of other part of left foot with other specified severity E11.621 Type 2 diabetes mellitus with foot ulcer Quantity: 1 : [id number] - WC PHYS DEBR WO ANESTH 20 SQ CM ICD-10 Diagnosis Description L97.528 Non-pressure chronic ulcer of other part of left foot with other specified severity Quantity: 1 Electronic Signature(s) Signed: 04/27/2022 3:33:25 PM By: Duanne Guess MD FACS Signed: 04/27/2022 5:16:51 PM By: Zenaida Deed RN, BSN Previous Signature: 04/27/2022 9:38:35 AM Version By: Duanne Guess MD FACS Entered By: Zenaida Deed on 04/27/2022 11:46:30

## 2022-05-03 ENCOUNTER — Encounter (INDEPENDENT_AMBULATORY_CARE_PROVIDER_SITE_OTHER): Payer: Self-pay

## 2022-05-03 ENCOUNTER — Encounter (INDEPENDENT_AMBULATORY_CARE_PROVIDER_SITE_OTHER): Payer: BC Managed Care – PPO | Admitting: Ophthalmology

## 2022-05-04 ENCOUNTER — Encounter (HOSPITAL_BASED_OUTPATIENT_CLINIC_OR_DEPARTMENT_OTHER): Payer: BC Managed Care – PPO | Admitting: General Surgery

## 2022-05-04 DIAGNOSIS — E11621 Type 2 diabetes mellitus with foot ulcer: Secondary | ICD-10-CM | POA: Diagnosis not present

## 2022-05-05 NOTE — Progress Notes (Signed)
[place], [name] ([id number]) [id number] Page 1 of 8 Visit Report for 05/04/2022 Arrival Information Details Patient Name: Date of Service: [name] 05/04/2022 8:45 A M Medical Record Number: [id number] Patient Account Number: [id number] Date of Birth/Sex: Treating RN: 1985-06-09 (37 y.o. [name] Primary Care Alexcia Schools: [name] Other Clinician: Referring Selenne Coggin: Treating Lamaya Hyneman/Extender: [name] in Treatment: 43 Visit Information History Since Last Visit Added or deleted any medications: No Patient Arrived: Ambulatory Any new allergies or adverse reactions: No Arrival Time: 08:49 Had a fall or experienced change in No Accompanied By: self activities of daily living that may affect Transfer Assistance: None risk of falls: Patient Identification Verified: Yes Signs or symptoms of abuse/neglect since No Secondary Verification Process Completed: Yes last visito Patient Requires Transmission-Based Precautions: No Hospitalized since last visit: No Patient Has Alerts: No Implantable device outside of the clinic No excluding cellular tissue based products placed in the center since last visit: Has Dressing in Place as Prescribed: Yes Has Footwear/Offloading in Place as Yes Prescribed: Left: Surgical Shoe with Pressure Relief Insole Pain Present Now: No Electronic Signature(s) Signed: 05/04/2022 6:15:21 PM By: Baruch Gouty RN, BSN Entered By: Baruch Gouty on 05/04/2022 08:50:32 -------------------------------------------------------------------------------- Encounter Discharge Information Details Patient Name: Date of Service: [name], [name] 05/04/2022 8:45 A M Medical Record Number: [id number] Patient Account Number: [id number] Date of Birth/Sex: Treating RN: Mar 05, 1986 (37 y.o. [name] Primary Care Johnhenry Tippin: [name] Other Clinician: Referring Nuno Brubacher: Treating  Elder Davidian/Extender: [name] in Treatment: 131 Encounter Discharge Information Items Post Procedure Vitals Discharge Condition: Stable Temperature (F): 98 Ambulatory Status: Ambulatory Pulse (bpm): 93 Discharge Destination: Home Respiratory Rate (breaths/min): 18 Transportation: Private Auto Blood Pressure (mmHg): 134/73 Accompanied By: self Schedule Follow-up Appointment: Yes Clinical Summary of Care: Patient Declined Electronic Signature(s) Signed: 05/04/2022 6:15:21 PM By: Baruch Gouty RN, BSN Entered By: Baruch Gouty on 05/04/2022 09:39:46 [place], [name] ([id number]) [id number] Page 2 of 8 -------------------------------------------------------------------------------- Lower Extremity Assessment Details Patient Name: Date of Service: [name] 05/04/2022 8:45 A M Medical Record Number: [id number] Patient Account Number: [id number] Date of Birth/Sex: Treating RN: 12-27-1985 (37 y.o. [name] Primary Care Terre Zabriskie: [name] Other Clinician: Referring Kenon Delashmit: Treating Trew Sunde/Extender: [name] in Treatment: 131 Edema Assessment Assessed: [name]: No] [Right: No] Edema: [Left: Ye] [Right: s] Calf Left: Right: Point of Measurement: 48 cm From Medial Instep 47.5 cm Ankle Left: Right: Point of Measurement: 11 cm From Medial Instep 30.5 cm Vascular Assessment Pulses: Dorsalis Pedis Palpable: [Left:Yes] Electronic Signature(s) Signed: 05/04/2022 6:15:21 PM By: Baruch Gouty RN, BSN Entered By: Baruch Gouty on 05/04/2022 09:05:50 -------------------------------------------------------------------------------- Multi Wound Chart Details Patient Name: Date of Service: [name], [name] 05/04/2022 8:45 A M Medical Record Number: [id number] Patient Account Number: [id number] Date of Birth/Sex: Treating RN: [birth year] (37 y.o. M) Primary Care Gaytha Raybourn: [name] Other Clinician: Referring Harly Pipkins: Treating Agata Lucente/Extender: [name] in Treatment: 131 Vital Signs Height(in): 77 Capillary Blood Glucose(mg/dl): 87 Weight(lbs): 280 Pulse(bpm): 93 Body Mass Index(BMI): 33.2 Blood Pressure(mmHg): 134/73 Temperature(F): 98 Respiratory Rate(breaths/min): 18 [3:Photos:] [N/A:N/A] Left, Lateral, Plantar Foot N/A N/A Wound Location: Trauma N/A N/A Wounding Event: Diabetic Wound/Ulcer of the Lower N/A N/A Primary Etiology: Extremity Type II Diabetes N/A N/A Comorbid History: 10/02/2019 N/A N/A Date Acquired: 131 N/A N/A Weeks of Treatment: Open N/A N/A Wound Status: No N/A N/A Wound Recurrence: 3.1x3.5x1.6 N/A N/A Measurements L x W  x [name] 8.522 N/A N/A A (cm) : rea 13.635 N/A N/A Volume (cm) : -416.80% N/A N/A % Reduction in A rea: -8163.60% N/A N/A % Reduction in Volume: Grade 2 N/A N/A Classification: Medium N/A N/A Exudate A mount: Serosanguineous N/A N/A Exudate Type: red, brown N/A N/A Exudate Color: Thickened N/A N/A Wound Margin: Large (67-100%) N/A N/A Granulation A mount: Red N/A N/A Granulation Quality: Small (1-33%) N/A N/A Necrotic A mount: Fat Layer (Subcutaneous Tissue): Yes N/A N/A Exposed Structures: Fascia: No Tendon: No Muscle: No Joint: No Bone: No Small (1-33%) N/A N/A Epithelialization: Debridement - Selective/Open Wound N/A N/A Debridement: Pre-procedure Verification/Time Out 09:10 N/A N/A Taken: Lidocaine 4% Topical Solution N/A N/A Pain Control: Callus N/A N/A Tissue Debrided: Skin/Epidermis N/A N/A Level: 10.85 N/A N/A Debridement A (sq cm): rea Curette N/A N/A Instrument: Minimum N/A N/A Bleeding: Pressure N/A N/A Hemostasis A chieved: 0 N/A N/A Procedural Pain: 0 N/A N/A Post Procedural Pain: Procedure was tolerated well N/A N/A Debridement Treatment Response: 3.1x3.5x1.6 N/A N/A Post Debridement Measurements L x W x [name]  (cm) 13.635 N/A N/A Post Debridement Volume: (cm) Callus: Yes N/A N/A Periwound Skin Texture: Maceration: Yes N/A N/A Periwound Skin Moisture: No Abnormalities Noted N/A N/A Periwound Skin Color: No Abnormality N/A N/A Temperature: Debridement N/A N/A Procedures Performed: Negative Pressure Wound Therapy Maintenance (NPWT) Treatment Notes Electronic Signature(s) Signed: 05/04/2022 9:21:48 AM By: Duanne Guess MD FACS Entered By: Duanne Guess on 05/04/2022 09:21:48 -------------------------------------------------------------------------------- Multi-Disciplinary Care Plan Details Patient Name: Date of Service: [name], [name] 05/04/2022 8:45 A M Medical Record Number: [id number] Patient Account Number: [id number] Date of Birth/Sex: Treating RN: [birth year] (36 y.o. [name] Primary Care Brianca Fortenberry: [name] Other Clinician: Referring Mcgregor Tinnon: Treating Cherell Colvin/Extender: [name] in Treatment: 131 Multidisciplinary Care Plan reviewed with physician [place], [name] ([id number]) [id number] Page 4 of 8 Active Inactive Nutrition Nursing Diagnoses: Imbalanced nutrition Potential for alteratiion in Nutrition/Potential for imbalanced nutrition Goals: Patient/caregiver agrees to and verbalizes understanding of need to use nutritional supplements and/or vitamins as prescribed Date Initiated: 10/24/2019 Date Inactivated: 04/06/2020 Target Resolution Date: 04/03/2020 Goal Status: Met Patient/caregiver will maintain therapeutic glucose control Date Initiated: 10/24/2019 Target Resolution Date: 06/01/2022 Goal Status: Active Interventions: Assess HgA1c results as ordered upon admission and as needed Assess patient nutrition upon admission and as needed per policy Provide education on elevated blood sugars and impact on wound healing Provide education on nutrition Treatment Activities: Education provided on  Nutrition : 02/02/2022 Notes: 11/17/20: Glucose control ongoing issue, target date extended. 01/26/21: Glucose management continues. Wound/Skin Impairment Nursing Diagnoses: Impaired tissue integrity Knowledge deficit related to ulceration/compromised skin integrity Goals: Patient/caregiver will verbalize understanding of skin care regimen Date Initiated: 10/24/2019 Target Resolution Date: 06/01/2022 Goal Status: Active Ulcer/skin breakdown will have a volume reduction of 30% by week 4 Date Initiated: 10/24/2019 Date Inactivated: 01/16/2020 Target Resolution Date: 01/10/2020 Unmet Reason: no change in Goal Status: Unmet measurements. Interventions: Assess patient/caregiver ability to obtain necessary supplies Assess patient/caregiver ability to perform ulcer/skin care regimen upon admission and as needed Assess ulceration(s) every visit Provide education on ulcer and skin care Notes: 11/17/20: Wound care regimen continues Electronic Signature(s) Signed: 05/04/2022 6:15:21 PM By: Zenaida Deed RN, BSN Entered By: Zenaida Deed on 05/04/2022 09:11:39 -------------------------------------------------------------------------------- Negative Pressure Wound Therapy Maintenance (NPWT) Details Patient Name: Date of Service: [name] 05/04/2022 8:45 A M Medical Record Number: [id number] Patient Account Number: [id number] Date of Birth/Sex: Treating RN: 11/27/1985 (36 y.o.  [name] Primary Care Jonael Paradiso: [name] Other Clinician: Referring Ronee Ranganathan: Treating Sophea Rackham/Extender: [name] in Treatment: 131 NPWT Maintenance Performed for: Wound #3 Left, Lateral, Plantar Foot Performed By: Baruch Gouty, RN Type: [place], [name] ([id number]) [id number] Page 5 of 8 Coverage Size (sq cm): 10.85 Pressure Type: Constant Pressure Setting: 125 mmHG Drain Type: None Primary Contact: Other : silver  collagen Sponge/Dressing Type: Foam, Black Date Initiated: 02/11/2022 Dressing Removed: No Quantity of Sponges/Gauze Removed: 2 Canister Changed: No Dressing Reapplied: No Quantity of Sponges/Gauze Inserted: 2 Respones T Treatment: o good Days On NPWT : 83 Post Procedure Diagnosis Same as Pre-procedure Electronic Signature(s) Signed: 05/04/2022 6:15:21 PM By: Baruch Gouty RN, BSN Entered By: Baruch Gouty on 05/04/2022 09:17:32 -------------------------------------------------------------------------------- Pain Assessment Details Patient Name: Date of Service: [name], [name] 05/04/2022 8:45 A M Medical Record Number: [id number] Patient Account Number: [id number] Date of Birth/Sex: Treating RN: 03-04-1986 (37 y.o. [name] Primary Care Nealy Hickmon: [name] Other Clinician: Referring Lanijah Warzecha: Treating Karlena Luebke/Extender: [name] in Treatment: 131 Active Problems Location of Pain Severity and Description of Pain Patient Has Paino No Site Locations Rate the pain. Current Pain Level: 0 Pain Management and Medication Current Pain Management: Electronic Signature(s) Signed: 05/04/2022 6:15:21 PM By: Baruch Gouty RN, BSN Entered By: Baruch Gouty on 05/04/2022 08:54:35 [place], [name] ([id number]) [id number] Page 6 of 8 -------------------------------------------------------------------------------- Patient/Caregiver Education Details Patient Name: Date of Service: [name] 1/31/2024andnbsp8:45 A M Medical Record Number: [id number] Patient Account Number: [id number] Date of Birth/Gender: Treating RN: [birth year] (37 y.o. [name] Primary Care Physician: [name] Other Clinician: Referring Physician: Treating Physician/Extender: [name] in Treatment: 131 Education Assessment Education Provided To: Patient Education Topics Provided Elevated  Blood Sugar/ Impact on Healing: Methods: Explain/Verbal Responses: Reinforcements needed, State content correctly Offloading: Methods: Explain/Verbal Responses: Reinforcements needed, State content correctly Wound/Skin Impairment: Methods: Explain/Verbal Responses: Reinforcements needed, State content correctly Electronic Signature(s) Signed: 05/04/2022 6:15:21 PM By: Baruch Gouty RN, BSN Entered By: Baruch Gouty on 05/04/2022 09:12:13 -------------------------------------------------------------------------------- Wound Assessment Details Patient Name: Date of Service: [name], [name] 05/04/2022 8:45 A M Medical Record Number: [id number] Patient Account Number: [id number] Date of Birth/Sex: Treating RN: 1985-04-16 (37 y.o. [name] Primary Care Kaidence Sant: [name] Other Clinician: Referring Ragan Reale: Treating Jarrett Albor/Extender: [name] in Treatment: 131 Wound Status Wound Number: 3 Primary Etiology: Diabetic Wound/Ulcer of the Lower Extremity Wound Location: Left, Lateral, Plantar Foot Wound Status: Open Wounding Event: Trauma Comorbid History: Type II Diabetes Date Acquired: 10/02/2019 Weeks Of Treatment: 131 Clustered Wound: No Photos [place], [name] ([id number]) [id number] Page 7 of 8 Wound Measurements Length: (cm) 3.1 Width: (cm) 3.5 Depth: (cm) 1.6 Area: (cm) 8.522 Volume: (cm) 13.635 % Reduction in Area: -416.8% % Reduction in Volume: -8163.6% Epithelialization: Small (1-33%) Tunneling: No Undermining: No Wound Description Classification: Grade 2 Wound Margin: Thickened Exudate Amount: Medium Exudate Type: Serosanguineous Exudate Color: red, brown Foul Odor After Cleansing: No Slough/Fibrino No Wound Bed Granulation Amount: Large (67-100%) Exposed Structure Granulation Quality: Red Fascia Exposed: No Necrotic Amount: Small (1-33%) Fat Layer (Subcutaneous Tissue) Exposed:  Yes Necrotic Quality: Adherent Slough Tendon Exposed: No Muscle Exposed: No Joint Exposed: No Bone Exposed: No Periwound Skin Texture Texture Color No Abnormalities Noted: No No Abnormalities Noted: Yes Callus: Yes Temperature / Pain Temperature: No Abnormality Moisture No Abnormalities Noted: No Maceration: Yes Treatment Notes Wound #3 (Foot) Wound Laterality: Plantar, Left,  Lateral Cleanser Soap and Water Discharge Instruction: May shower and wash wound with dial antibacterial soap and water prior to dressing change. Peri-Wound Care Skin Prep Discharge Instruction: Use skin prep as directed Topical Primary Dressing Promogran Prisma Matrix, 4.34 (sq in) (silver collagen) Discharge Instruction: Moisten collagen with saline or hydrogel VAC Secondary Dressing Zetuvit Plus 4x8 in Discharge Instruction: Apply over primary dressing as directed. Secured With Elastic Bandage 4 inch (ACE bandage) Discharge Instruction: Secure with ACE bandage as directed. 8M Medipore Soft Cloth Surgical T 2x10 (in/yd) ape Discharge Instruction: Secure with tape as directed. [place], [name] ([id number]) [id number] Page 8 of 8 Compression Wrap Kerlix Roll 4.5x3.1 (in/yd) Discharge Instruction: Apply Kerlix and Coban compression as directed. Compression Stockings Add-Ons Electronic Signature(s) Signed: 05/04/2022 6:15:21 PM By: Baruch Gouty RN, BSN Entered By: Baruch Gouty on 05/04/2022 09:10:20 -------------------------------------------------------------------------------- Vitals Details Patient Name: Date of Service: [name], [name] 05/04/2022 8:45 A M Medical Record Number: [id number] Patient Account Number: [id number] Date of Birth/Sex: Treating RN: 21-Jun-1985 (37 y.o. [name] Primary Care Livingston Denner: [name] Other Clinician: Referring Mallorey Odonell: Treating Raymie Trani/Extender: [name] in Treatment: 131 Vital  Signs Time Taken: 08:52 Temperature (F): 98 Height (in): 77 Pulse (bpm): 93 Weight (lbs): 280 Respiratory Rate (breaths/min): 18 Body Mass Index (BMI): 33.2 Blood Pressure (mmHg): 134/73 Capillary Blood Glucose (mg/dl): 87 Reference Range: 80 - 120 mg / dl Notes glucose per pt report this am Electronic Signature(s) Signed: 05/04/2022 6:15:21 PM By: Baruch Gouty RN, BSN Entered By: Baruch Gouty on 05/04/2022 08:57:42

## 2022-05-05 NOTE — Progress Notes (Signed)
[place], [name] ([id number]) [id number] Page 1 of 21 Visit Report for 05/04/2022 Chief Complaint Document Details Patient Name: Date of Service: [name] 05/04/2022 8:45 A M Medical Record Number: [id number] Patient Account Number: [id number] Date of Birth/Sex: Treating RN: [birth year] (36 y.o. M) Primary Care Provider: [name] Other Clinician: Referring Provider: Treating Provider/Extender: [name] in Treatment: 131 Information Obtained from: Patient Chief Complaint 01/11/2019; patient is here for review of a rather substantial wound over the left fifth plantar metatarsal head extending into the lateral part of his foot 10/24/2019; patient returns to clinic with wounds on his bilateral feet with underlying osteomyelitis biopsy-proven Electronic Signature(s) Signed: 05/04/2022 9:21:55 AM By: Duanne Guess MD FACS Entered By: Duanne Guess on 05/04/2022 09:21:55 -------------------------------------------------------------------------------- Debridement Details Patient Name: Date of Service: [name], [name] 05/04/2022 8:45 A M Medical Record Number: [id number] Patient Account Number: [id number] Date of Birth/Sex: Treating RN: [birth year] (36 y.o. [name] Primary Care Provider: [name] Other Clinician: Referring Provider: Treating Provider/Extender: [name] in Treatment: 131 Debridement Performed for Assessment: Wound #3 Left,Lateral,Plantar Foot Performed By: Physician Duanne Guess, MD Debridement Type: Debridement Severity of Tissue Pre Debridement: Fat layer exposed Level of Consciousness (Pre-procedure): Awake and Alert Pre-procedure Verification/Time Out Yes - 09:10 Taken: Start Time: 09:14 Pain Control: Lidocaine 4% T opical Solution T Area Debrided (L x W): otal 3.1 (cm) x 3.5 (cm) = 10.85 (cm) Tissue and other material debrided: Non-Viable,  Callus, Skin: Epidermis, Biofilm Level: Skin/Epidermis Debridement Description: Selective/Open Wound Instrument: Curette Bleeding: Minimum Hemostasis Achieved: Pressure Procedural Pain: 0 Post Procedural Pain: 0 Response to Treatment: Procedure was tolerated well Level of Consciousness (Post- Awake and Alert procedure): Post Debridement Measurements of Total Wound Length: (cm) 3.1 Width: (cm) 3.5 Depth: (cm) 1.6 Volume: (cm) 13.635 Character of Wound/Ulcer Post Debridement: Improved Severity of Tissue Post Debridement: Fat layer exposed [place], [name] ([id number]) [id number] Page 2 of 21 Post Procedure Diagnosis Same as Pre-procedure Notes Scribed for Dr. Lady Gary by Zenaida Deed, RN Electronic Signature(s) Signed: 05/04/2022 9:31:25 AM By: Duanne Guess MD FACS Signed: 05/04/2022 6:15:21 PM By: Zenaida Deed RN, BSN Entered By: Zenaida Deed on 05/04/2022 09:18:57 -------------------------------------------------------------------------------- HPI Details Patient Name: Date of Service: [name], [name] 05/04/2022 8:45 A M Medical Record Number: [id number] Patient Account Number: [id number] Date of Birth/Sex: Treating RN: 1985/06/22 (36 y.o. 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 He comes in for review of a wound over the plantar fifth metatarsal head extending into the lateral part of the foot. He was followed for this previously by his podiatrist Dr. Darreld Mclean. As the patient tells his story he went to see podiatry first for a swelling he developed on the lateral part of his fifth metatarsal head in May. He states this was "open" by podiatry and the area closed. He  was followed up in June and it was again opened callus removed and it closed promptly. There were plans being made for surgery on the fifth metatarsal head in June however his blood sugar was apparently too high for anesthesia. Apparently the area was debrided and opened again in June and it is never closed since. Looking over the records from podiatry I am really not able to follow this. It was  clear when he was first seen it was before 5/14 at that point he already had a wound. By 5/17 the ulcer was resolved. I do not see anything about a procedure. On 5/28 noted to have pre-ulcerative moderate keratosis. X-ray noted 1/5 contracted toe and tailor's bunion and metatarsal deformity. On a visit date on 09/28/2018 the dorsal part of the left foot it healed and resolved. There was concern about swelling in his lower extremity he was sent to the ER.. As far as I can tell he was seen in the ER on 7/12 with an ulcer on his left foot. A DVT rule out of the left leg was negative. I do not think I have complete records from podiatry but I am not able to verify the procedures this patient states he had. He states after the last procedure the wound has never closed although I am not able to follow this in the records I have from podiatry. He has not had a recent x-ray The patient has been using Neosporin on the wound. He is wearing a Darco shoe. He is still very active up on his foot working and exercising. Past medical history; type 2 diabetes ketosis-prone, leg swelling with a negative DVT study in July. Non-smoker ABI in our clinic was 0.85 on the left 10/16; substantial wound on the plantar left fifth met head extending laterally almost to the dorsal fifth MTP. We have been using silver alginate we gave him a Darco forefoot off loader. An x-ray did not show evidence of osteomyelitis did note soft tissue emphysema which I think was due to gas tracking through an open wound. There is no doubt in my mind he  requires an MRI 10/23; MRI not booked until 3 November at the earliest this is largely due to his glucose sensor in the right arm. We have been using silver alginate. There has been an improvement 10/29; I am still not exactly sure when his MRI is booked for. He says it is the third but it is the 10th in epic. This definitely needs to be done. He is running a low-grade fever today but no other symptoms. No real improvement in the 1 02/26/2019 patient presents today for a follow-up visit here in our clinic he is last been seen in the clinic on October 29. Subsequently we were working on getting MRI to evaluate and see what exactly was going on and where we would need to go from the standpoint of whether or not he had osteomyelitis and again what treatments were going be required. Subsequently the patient ended up being admitted to the hospital on 02/07/2019 and was discharged on 02/14/2019. This is a somewhat interesting admission with a discharge diagnosis of pneumonia due to COVID-19 although he was positive for COVID-19 when tested at the urgent care but negative x2 when he was actually in the hospital. With that being said he did have acute respiratory failure with hypoxia and it was noted he also have a left foot ulceration with osteomyelitis. With that being said he did require oxygen for his pneumonia and I level 4 L. He was placed on antivirals and steroids for the COVID-19. [facility]. Nonetheless he did subsequently discharged home and since being home has done much better in that regard. The CT angiogram did not show any pulmonary embolism. With regard to the osteomyelitis the patient was placed on vancomycin and Zosyn while in the hospital but  has been changed to Augmentin at discharge. It was also recommended that he follow- up with wound care and podiatry. Podiatry however wanted him to see [name] according to the patient prior to them doing  anything further. His hemoglobin A1c was 9.9 as noted in the hospital. Have an MRI of the left foot performed while in the hospital on 02/04/2019. This showed evidence of septic arthritis at the fifth MTP joint and osteomyelitis involving the fifth metatarsal head and proximal phalanx. There is an overlying plantar open wound noted an abscess tracking back along the lateral aspect of the fifth metatarsal shaft. There is otherwise diffuse cellulitis and mild fasciitis without findings of polymyositis. The patient did have recently pneumonia secondary to COVID-19 I looked in the chart through epic and it does appear that the patient may need to have an additional x-ray just to ensure everything is cleared and that he has no airspace disease prior to putting him into the chamber. 03/05/2019; patient was readmitted to the clinic last week. He was hospitalized twice for a viral upper respiratory tract infection from 11/1 through 11/4 and then 11/5 through 11/12 ultimately this turned out to be Covid pneumonitis. Although he was discharged on oxygen he is not using it. He says he feels fine. He has no exercise limitation no cough no sputum. His O2 sat in our clinic today was 100% on room air. He did manage to have his MRI which showed septic arthritis at the fifth MTP joint and osteomyelitis involving the fifth metatarsal head and proximal phalanx. He received Vanco and Zosyn in the hospital and then was discharged on 2 weeks of Augmentin. I do not see any relevant cultures. He was supposed to [place], [name] ([id number]) [id number] Page 3 of 21 follow-up with infectious disease but I do not see that he has an appointment. 12/8; patient saw Dr. Ronnie Derby of infectious disease last week. He felt that he had had adequate antibiotic therapy. He did not go to follow-up with Dr. Logan Bores of podiatry and I have again talked to him about the pros and cons of this. He does not want to consider a  ray amputation of this time. He is aware of the risks of recurrence, migration etc. He started HBO today and tolerated this well. He can complete the Augmentin that I gave him last week. I have looked over the lab work that Dr. Effie Shy ordered his C-reactive protein was 3.3 and his sedimentation rate was 17. The C-reactive protein is never really been measurably that high in this patient 12/15; not much change in the wound today however he has undermining along the lateral part of the foot again more extensively than last week. He has some rims of epithelialization. We have been using silver alginate. He is undergoing hyperbarics but did not dive today 12/18; in for his obligatory first total contact cast change. Unfortunately there was pus coming from the undermining area around his fifth metatarsal head. This was cultured but will preclude reapplication of a cast. He is seen in conjunction with HBO 12/24; patient had staph lugdunensis in the wound in the undermining area laterally last time. We put him on doxycycline which should have covered this. The wound looks better today. I am going to give him another week of doxycycline before reattempting the total contact cast 12/31; the patient is completing antibiotics. Hemorrhagic debris in the distal part of the wound with some undermining distally. He also had hyper granulation. Extensive debridement with a #5 curette.  The infected area that was on the lateral part of the fifth met head is closed over. I do not think he needs any more antibiotics. Patient was seen prior to HBO. Preparations for a total contact cast were made in the cast will be placed post hyperbarics 04/11/19; once again the patient arrives today without complaint. He had been in a cast all week noted that he had heavy drainage this week. This resulted in large raised areas of macerated tissue around the wound 1/14; wound bed looks better slightly smaller. Hydrofera Blue has been  changing himself. He had a heavy drainage last week which caused a lot of maceration around the wound so I took him out of a total contact cast he says the drainage is actually better this week He is seen today in conjunction with HBO 1/21; returns to clinic. He was up in [place] for a day or 2 attending a funeral. He comes back in with the wound larger and with a large area of exposed bone. He had osteomyelitis and septic arthritis of the fifth left metatarsal head while he was in hospital. He received IV antibiotics in the hospital for a prolonged period of time then 3 weeks of Augmentin. Subsequently I gave him 2 weeks of doxycycline for more superficial wound infection. When I saw this last week the wound was smaller the surface of the wound looks satisfactory. 1/28; patient missed hyperbarics today. Bone biopsy I did last time showed Enterococcus faecalis and Staphylococcus lugdunensis . He has a wide area of exposed bone. We are going to use silver alginate as of today. I had another ethical discussion with the patient. This would be recurrent osteomyelitis he is already received IV antibiotics. In this situation I think the likelihood of healing this is low. Therefore I have recommended a ray amputation and with the patient's agreement I have referred him to Dr. Victorino Dike. The other issue is that his compliance with hyperbarics has been minimal because of his work schedule and given his underlying decision I am going to stop this today READMISSION 10/24/2019 MRI 09/29/2019 left foot IMPRESSION: 1. Apparent skin ulceration inferior and lateral to the 5th metatarsal base with underlying heterogeneous T2 signal and enhancement in the subcutaneous fat. Small peripherally enhancing fluid collections along the plantar and lateral aspects of the 5th metatarsal base suspicious for abscesses. 2. Interval amputation through the mid 5th metatarsal with nonspecific low-level marrow edema and  enhancement. Given the proximity to the adjacent soft tissue inflammatory changes, osteomyelitis cannot be excluded. 3. The additional bones appear unremarkable. MRI 09/29/2019 right foot IMPRESSION: 1. Soft tissue ulceration lateral to the 5th MTP joint. There is low-level T2 hyperintensity within the 4th and 5th metatarsal heads and adjacent proximal phalanges without abnormal T1 signal or cortical destruction. These findings are nonspecific and could be seen with early marrow edema, hyperemia or early osteomyelitis. No evidence of septic joint. 2. Mild tenosynovitis and synovial enhancement associated with the extensor digitorum tendons at the level of the midfoot. 3. Diffuse low-level muscular T2 hyperintensity and enhancement, most consistent with diabetic myopathy. LEFT FOOT BONE Methicillin resistant staphylococcus aureus Staphylococcus lugdunensis MIC MIC CIPROFLOXACIN >=8 RESISTANT Resistant <=0.5 SENSI... Sensitive CLINDAMYCIN <=0.25 SENS... Sensitive >=8 RESISTANT Resistant ERYTHROMYCIN >=8 RESISTANT Resistant >=8 RESISTANT Resistant GENTAMICIN <=0.5 SENSI... Sensitive <=0.5 SENSI... Sensitive Inducible Clindamycin NEGATIVE Sensitive NEGATIVE Sensitive OXACILLIN >=4 RESISTANT Resistant 2 SENSITIVE Sensitive RIFAMPIN <=0.5 SENSI... Sensitive <=0.5 SENSI... Sensitive TETRACYCLINE <=1 SENSITIVE Sensitive <=1 SENSITIVE Sensitive TRIMETH/SULFA <=10 SENSIT Sensitive <=  10 SENSIT Sensitive ... Marland Kitchen.. VANCOMYCIN 1 SENSITIVE Sensitive <=0.5 SENSI... Sensitive Right foot bone . Component 3 wk ago Specimen Description BONE Special Requests RIGHT 4 METATARSAL SAMPLE B Gram Stain NO WBC SEEN NO ORGANISMS SEEN Culture RARE METHICILLIN RESISTANT STAPHYLOCOCCUS AUREUS NO ANAEROBES ISOLATED Performed at [facility], [address] [place], [place] [address] Report Status 10/08/2019 FINAL Organism ID, Bacteria METHICILLIN RESISTANT STAPHYLOCOCCUS AUREUS [place], [name]  ([id number]) [id number] Page 4 of 21 Resulting Agency [facility] Susceptibility Methicillin resistant staphylococcus aureus MIC CIPROFLOXACIN >=8 RESISTANT Resistant CLINDAMYCIN <=0.25 SENS... Sensitive ERYTHROMYCIN >=8 RESISTANT Resistant GENTAMICIN <=0.5 SENSI... Sensitive Inducible Clindamycin NEGATIVE Sensitive OXACILLIN >=4 RESISTANT Resistant RIFAMPIN <=0.5 SENSI... Sensitive TETRACYCLINE <=1 SENSITIVE Sensitive TRIMETH/SULFA <=10 SENSIT Sensitive ... VANCOMYCIN 1 SENSITIVE Sensitive This is a patient we had in clinic earlier this year with a wound over his left fifth metatarsal head. 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 Victorino Dike on 05/16/2019. He seems to have developed problems with wounds on his bilateral feet in June 2021 although he may have had problems earlier than this. He was in an urgent care with a right foot ulcer on 09/26/2019 and given a course of doxycycline. This was apparently after having trouble getting into see orthopedics. He was seen by podiatry on 09/28/2019 noted to have bilateral lower extremity ulcers including the left lateral fifth metatarsal base and the right subfifth met head. It was noted that had purulent drainage at that time. He required hospitalization from 6/20 through 7/2. This was because of worsening right foot wounds. He underwent bilateral operative incision and drainage and bone biopsies bilaterally. Culture results are listed above. He has been referred back to clinic by Dr. Ardelle Anton of podiatry. He is also followed by Dr. Orvan Falconer who saw him yesterday. He was discharged from hospital on Zyvox Flagyl and Levaquin and yesterday changed  to doxycycline Flagyl and Levaquin. His inflammatory markers on 6/26 showed a sedimentation rate of 129 and a C-reactive protein of 5. This is improved to 14 and 1.3 respectively. This would indicate improvement. ABIs in our clinic today were 1.23 on the right and 1.20 on the left 11/01/2019 on evaluation today patient appears to be doing fairly well in regard to the wounds on his feet at this point. Fortunately there is no signs of active infection at this time. No fevers, chills, nausea, vomiting, or diarrhea. He currently is seeing infectious disease and still under their care at this point. Subsequently he also has both wounds which she has not been using collagen on as he did not receive that in his packaging he did not call us and let us know that. Apparently that just was missed on the order. Nonetheless we will get that straightened out today. 8/9-Patient returns for bilateral foot wounds, using Prisma with hydrogel moistened dressings, and the wounds appear stable. Patient using surgical shoes, avoiding much pressure or weightbearing as much as possible 8/16; patient has bilateral foot wounds. 1 on the right lateral foot proximally the other is on the left mid lateral foot. Both required debridement of callus and thick skin around the wounds. We have been using silver collagen 8/27; patient has bilateral lateral foot wounds. The area on the left substantially surrounded by  callus and dry skin. This was removed from the wound edge. The underlying wound is small. The area on the right measured somewhat smaller today. 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 Unfortunately I did not update his antibiotics during today's visit. 9/10 I reviewed Dr. Blair Dolphin last notes he felt he had completed antibiotics his inflammatory markers were reasonably well controlled. He has a small wound on the lateral left foot and a tiny area on  the right which is just above closed. He is using Hydrofera Blue with border foam he has bilateral surgical shoes 9/24; 2 week f/u. doing well. right foot is closed. left foot still undermined. 10/14; right foot remains closed at the fifth met head. The area over the base of the left fifth metatarsal has a small open area but considerable undermining towards the plantar foot. Thick callus skin around this suggests an adequate pressure relief. We have talked about this. He says he is going to go back into his cam boot. I suggested a total contact cast he did not seem enamored with this suggestion 10/26; left foot base of the fifth metatarsal. Same condition as last time. He has skin over the area with an open wound however the skin is not adherent. He went to see Dr. Loreta Ave who did an x-ray and culture of his foot I have not reviewed the x-ray but the patient was not told anything. He is on doxycycline 11/11; since the patient was last here he was in the emergency room on 10/30 he was concerned about swelling in the left foot. They did not do any cultures or x-rays. They changed his antibiotics to cephalexin. Previous culture showed group B strep. The cephalexin is appropriate as doxycycline has less than predictable coverage. Arrives in clinic today with swelling over this area under the wound. He also has a new wound on the right fifth metatarsal head 11/18; the patient has a difficult wound on the lateral aspect of the left fifth metatarsal head. The wound was almost ballotable last week I opened it slightly expecting to see purulence however there was just bleeding. I cultured this this was negative. X-ray unchanged. We are trying to get an MRI but I am not sure were going to be able to get this through his insurance. He also has an area on the right lateral fifth metatarsal head this looks healthier 12/3; the patient finally got our MRI. Surprisingly this did not show osteomyelitis. I did show the  soft tissue ulceration at the lateral plantar aspect of the fifth metatarsal base with a tiny residual 6 mm abscess overlying the superficial fascia I have tried to culture this area I have not been able to get this to grow anything. Nevertheless the protruding tissue looks aggravated. I suspect we should try to treat the underlying "abscess with broad-spectrum antibiotics. I am going to start him on Levaquin and Flagyl. He has much less edema in his legs and I am going to continue to wrap his legs and see him weekly 12/10. I started Levaquin and Flagyl on him last week. He just picked up the Flagyl apparently there was some delay. The worry is the wound on the left fifth metatarsal base which is substantial and worsening. His foot looks like he inverts at the ankle making this a weightbearing surface. Certainly no improvement in fact I think the measurements of this are somewhat worse. We have been using 12/17; he  apparently just got the Levaquin yesterday this is 2 weeks after the fact. He has completed the Flagyl. The area over the left fifth metatarsal base still has protruding granulation tissue although it does not look quite as bad as it did some weeks ago. He has severe bilateral lymphedema although we have not been treating him for wounds on his legs this is definitely going to require compression. There was so much edema in the left I did not wish to put him in a total contact cast today. I am going to increase his compression from 3-4 layer. The area on the right lateral fifth met head actually look quite good and superficial. 12/23; patient arrived with callus on the right fifth met head and the substantial hyper granulated callused wound on the base of his fifth metatarsal. He says he is completing his Levaquin in 2 days but I do not think that adds up with what I gave him but I will have to double check this. We are using Hydrofera Blue on both areas. My plan is to put the left leg in a  cast the week after New Year's 04/06/2020; patient's wounds about the same. Right lateral fifth metatarsal head and left lateral foot over the base of the fifth metatarsal. There is undermining on the left lateral foot which I removed before application of total contact cast continuing with Hydrofera Blue new. Patient tells me he was seen by endocrinology today lab work was done [Dr. Kerr]. Also wondering whether he was referred to cardiology. I went over some lab work from previously does not have chronic renal failure certainly not nephrotic range proteinuria he does have very poorly controlled diabetes but this is not his most updated lab work. Hemoglobin A1c has been over 11 1/10; the patient had a considerable amount of leakage towards mid part of his left foot with macerated skin however the wound surface looks better the area on the right lateral fifth met head is better as well. I am going to change the dressing on the left foot under the total contact cast to silver alginate, continue with Hydrofera Blue on the right. 1/20; patient was in the total contact cast for 10 days. Considerable amount of drainage although the skin around the wound does not look too bad on the left foot. The area on the right fifth metatarsal head is closed. Our nursing staff reports large amount of drainage out of the left lateral foot wound 1/25; continues with copious amounts of drainage described by our intake staff. PCR culture I did last week showed E. coli and Enterococcus faecalis and low quantities. Multiple resistance genes documented including extended spectrum beta lactamase, MRSA, MRSE, quinolone, tetracycline. The wound is not quite as good this week as it was 5 days ago but about the same size 2/3; continues with copious amounts of malodorous drainage per our intake nurse. The PCR culture I did 2 weeks ago showed E. coli and low quantities of Enterococcus. There were multiple resistance genes detected. I  put Neosporin on him last week although this does not seem to have helped. The wound is [place], [name] ([id number]) [id number] Page 5 of 21 slightly deeper today. Offloading continues to be an issue here although with the amount of drainage she has a total contact cast is just not going to work 2/10; moderate amount of drainage. Patient reports he cannot get his stocking on over the dressing. I told him we have to do that the nurse gave him  suggestions on how to make this work. The wound is on the bottom and lateral part of his left foot. Is cultured predominantly grew low amounts of Enterococcus, E. coli and anaerobes. There were multiple resistance genes detected including extended spectrum beta lactamase, quinolone, tetracycline. I could not think of an easy oral combination to address this so for now I am going to do topical antibiotics provided by [facility] I think the main agents here are vancomycin and an aminoglycoside. We have to be able to give him access to the wounds to get the topical antibiotic on 2/17; moderate amount of drainage this is unchanged. He has his Keystone topical antibiotic against the deep tissue culture organisms. He has been using this and changing the dressing daily. Silver alginate on the wound surface. 2/24; using Keystone antibiotic with silver alginate on the top. He had too much drainage for a total contact cast at one point although I think that is improving and I think in the next week or 2 it might be possible to replace a total contact cast I did not do this today. In general the wound surface looks healthy however he continues to have thick rims of skin and subcutaneous tissue around the wide area of the circumference which I debrided 06/04/2020 upon evaluation today patient appears to be doing well in regard to his wound. I do feel like he is showing signs of improvement. There is little bit of callus and dead tissue around the edges  of the wound as well as what appears to be a little bit of a sinus tract that is off to the side laterally I would perform debridement to clear that away today. 3/17; left lateral foot. The wound looks about the same as I remember. Not much depth surface looks healthy. No evidence of infection 3/25; left lateral foot. Wound surface looks about the same. Separating epithelium from the circumference. There really is no evidence of infection here however not making progress by my view 3/29; left lateral foot. Surface of the wound again looks reasonably healthy still thick skin and subcutaneous tissue around the wound margins. There is no evidence of infection. One of the concerns being brought up by the nurses has again the amount of drainage vis--vis continued use of a total contact cast 4/5; left lateral foot at roughly the base of the fifth metatarsal. Nice healthy looking granulated tissue with rims of epithelialization. The overall wound measurements are not any better but the tissue looks healthy. The only concern is the amount of drainage although he has no surrounding maceration with what we have been doing recently to absorb fluid and protect his skin. He also has lymphedema. 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 He states that the kerlix/coban under the TCC rolled down and could not get it back up. He has been using an offloading scooter and has somehow hurt his right foot using this device. This happened last week. He states that the side of his right foot developed a blister and opened. The top of his foot also has a few small open wounds he thinks is due to his socks rubbing in his shoes. He has not been using any dressings to the wound. He  denies purulent drainage, fever/chills or erythema to the wounds. 4/22; patient presents for 1 week follow-up. He developed new wounds to the right foot that were evaluated at last clinic visit. He continues to have a total contact cast to the left leg and he reports no issues. He has been using silver collagen to the right foot wounds with no issues. He denies purulent drainage, fever/chills or erythema to the right foot wounds. He has no complaints today 4/25; patient presents for 1 week follow-up. He has a total contact cast of the left leg and reports no issues. He has been using silver alginate to the right foot wound. He denies purulent drainage, fever/chills or erythema to the right foot wounds. 5/2 patient presents for 1 week follow-up. T contact cast on the left. The wound which is on the base of the plantar foot at the base of the fifth metatarsal otal actually looks quite good and dimensions continue to gradually contract. HOWEVER the area on the right lateral fifth metatarsal head is much larger than what I remember from 2 weeks ago. Once more is he has significant levels of hypergranulation. Noteworthy that he had this same hyper granulated response on his wound on the left foot at one point in time. So much so that he I thought there was an underlying fluid collection. Based on this I think this just needs debridement. 5/9; the wound on the left actually continues to be gradually smaller with a healthy surface. Slight amount of drainage and maceration of the skin around but not too bad. However he has a large wound over the right fifth metatarsal head very much in the same configuration as his left foot wound was initially. I used silver nitrate to address the hyper granulated tissue no mechanical debridement 5/16; area on the left foot did not look as healthy this week deeper thick surrounding macerated skin and subcutaneous tissue. The area on the right foot fifth met head was about  the same The area on the right ankle that we identified last week is completely broken down into an open wound presumably a stocking rubbing issue 5/23; patient has been using a total contact cast to the left side. He has been using silver alginate underneath. He has also been using silver alginate to the right foot wounds. He has no complaints today. He denies any signs of infection. 5/31; the left-sided wound looks some better measure smaller surface granulation looks better. We have been using silver alginate under the total contact cast The large area on his right fifth met head and right dorsal foot look about the same still using silver alginate 6/6; neither side is good as I was hoping although the surface area dimensions are better. A lot of maceration on his left and right foot around the wound edge. Area on the dorsal right foot looks better. He says he was traveling. I am not sure what does the amount of maceration around the plantar wounds may be drainage issues 6/13; in general the wound surfaces look quite good on both sides. Macerated skin and raised edges around the wound required debridement although in general especially on the left the surface area seems improved. The area on the right dorsal ankle is about the same I thought this would not be such a problem to close 6/20; not much change in either wound although the one on the right looks a little better. Both wounds have thick macerated edges to the skin requiring debridements. We have  been using silver alginate. The area on the dorsal right ankle is still open I thought this would be closed. 6/28; patient comes in today with a marked deterioration in the right foot wound fifth met head. Wide area of exposed bone this is a drastic change from last time. The area on the left there we have been casting is stagnant. We have been using silver alginate in both wound areas. 7/5; bone culture I did for PCR last time was positive for  Pseudomonas, group B strep, Enterococcus and Staph aureus. There was no suggestion of methicillin resistance or ampicillin resistant genes. This was resistant to tetracycline however He comes into the clinic today with the area over his right plantar fifth metatarsal head which had been doing so well 2 weeks ago completely necrotic feeling bone. I do not know that this is going to be salvageable. The left foot wound is certainly no smaller but it has a better surface and is superficial. 7/8; patient called in this morning to say that his total contact cast was rubbing against his foot. He states he is doing fine overall. He denies signs of infection. 7/12; continued deterioration in the wound over the right fifth metatarsal head crumbling bone. This is not going to be salvageable. The patient agrees and wants to be referred to Dr. Victorino Dike which we will attempt to arrange as soon as possible. I am going to continue him on antibiotics as long as that takes so I will renew those today. The area on the left foot which is the base of the fifth metatarsal continues to look somewhat better. Healthy looking tissue no depth no debridement is necessary here. 7/20; the patient was kindly seen by Dr. Victorino Dike of orthopedics on 10/19/2020. 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 Towards this end we have taken him out of the total contact cast on the left we will put him in a wrap with Hydrofera Blue. As I understand things surgery is planned for 7/21 7/27; patient had his surgery last Thursday. 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 Apparently everything went well we did not still disturb that today The area on the left foot actually looks quite good. He has been much less mobile which probably explains this he did not seem to do well in the total contact cast secondary to drainage and maceration I think. We have been  using [facility], [name] ([id number]) [id number] Page 6 of 21 11/09/2020 upon evaluation today patient appears to be doing well with regard to his plantar foot ulcer on the left foot. Fortunately there is no evidence of active infection at this time. No fevers, chills, nausea, vomiting, or diarrhea. Overall I think that he is actually doing extremely well. Nonetheless I do believe that he is staying off of this more following the surgery in his right foot that is the reason the left is doing so great. 8/16; left plantar foot wound. This looks smaller than the last time I saw this he is using Hydrofera Blue. The surgical wound on the right foot is being followed by Dr. Victorino Dike we did not look at this today. He has surgical shoes on both feet 8/23; left plantar foot wound not as good this week. Surrounding macerated skin and subcutaneous tissue everything looks moist and wet. I do not think he is offloading this adequately. He is using  a surgical shoe Apparently the right foot surgical wound is not open although I did not check his foot 8/31; left plantar foot lateral aspect. Much improved this week. He has no maceration. Some improvement in the surface area of the wound but most impressively the depth is come in we are using silver alginate. The patient is a Veterinary surgeon. He is asked that we write him a letter so he can go back to work. I have also tried to see if we can write something that will allow him to limit the amount of time that he is on his foot at work. 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 Hopefully the latter can be avoided 9/6; I believe the patient missed an appointment last week. He arrives in today with a wound looking roughly the same certainly no better. Undermining laterally and also inferiorly. We used molecuLight today in training with the patient's  permission.. We are using silver alginate 9/21 wound is measuring bigger this week although this may have to do with the aggressive circumferential debridement last week in response to the blush fluorescence on the MolecuLight. Culture I did last week showed significant MSSA and E. coli. I put him on Augmentin but he has not started it yet. We are also going to send this for compounded antibiotics at [facility]. There is no evidence of systemic infection 9/29; silver alginate. His Keystone arrived. He is completing Augmentin in 2 days. Offloading in a cam boot. Moderate drainage per our intake staff 10/5; using silver alginate. He has been using his [place]. He has completed his Augmentin. Per our intake nurse still a lot of drainage, far too much to consider a total contact cast. Wound measures about the same. He had the same undermining area that I defined last week from a roughly 11-3. I remove this today 10/12; using silver alginate he is using the Cornucopia. He comes in for a nurse visit hence we are applying Jodie Echevaria twice a week. Measuring slightly better today and less notable drainage. Extensive debridement of the wound edge last time 10/18; using topical Keystone and silver alginate and a soft cast. Wound measurements about the same. Drainage was through his soft cast. We are changing this twice a week Tuesdays and Friday 10/25; comes in with moderate drainage. Still using Keystone silver alginate and a soft cast. Wound dimensions completely the same.He has a lot of edema in the left leg he has lymphedema. Asking for [name] to consider wrapping him as he cannot get his stocking on over the soft cast 11/2; comes in with moderate to large drainage slightly smaller in terms of width we have been using [place]. His wound looks satisfactory but not much improvement 11/4; patient presents today for obligatory cast change. Has no issues or complaints today. He denies signs of infection. 11/9; patient  traveled this weekend to [place], was on the cast quite a bit. Staining of the cast with black material from his walking boot. Drainage was not quite as bad as we feared. Using silver alginate and Keystone 11/16; we do not have size for cast therefore we have been putting a soft cast on him since the change on Friday. Still a significant amount of drainage necessitating changing twice a week. We have been using the Keystone at cast changes either hard or soft as well as silver alginate Comes in the clinic with things actually  looking fairly good improvement in width. He says his offloading is about the same 02/24/2021 upon evaluation today patient actually comes back in and is doing excellent in regard to his foot ulcer this is significantly smaller even compared to the last visit. The soft cast seems to have done extremely well for him which is great news. I do not see any signs of infection minimal debridement will be needed today. 11/30; left lateral foot much improved half a centimeter improvement in surface area. No evidence of infection. He seems to be doing better with the soft cast in the TCC therefore we will continue with this. He comes back in later in the week for a change with the nurses. This is due to drainage 12/6; no improvement in dimensions. Under illumination some debris on the surface we have been using silver alginate, soft cast. If there is anything optimistic here he seems to have have less drainage 12/13. Dimensions are improved both length and width and slightly in depth. Appears to be quite healthy today. Raised edges of this thick skin and callus around the edges however. He is in a soft cast were bringing him back once for a change on Friday. Drainage is better 12/20. Dimensions are improved. He still has raised edges of thick skin and callus around the edges. We are using a soft cast 12/28; comes in today with thick callus around the wound. Using silver under alginate under a  soft cast. I do not think there is much improvement in any measurement 2023 04/06/2021; patient was put in a total contact cast. Unfortunately not much change in surface area 1/10; not much different still thick callus and skin around the edge in spite of the total contact cast. This was just debrided last week we have been using the [facility] compounded antibiotic and silver alginate under a total contact cast 1/18 the patient's wound on the left side is doing nicely. smaller HOWEVER he comes in today with a wound on the right foot laterally. blister most likely serosangquenous drainage 1/24; the patient continues to do well in terms of the plantar left foot which is continued to contract using silver alginate under the total contact cast HOWEVER the right lateral foot is bigger with denuded skin around the edges. I used pickups and a #15 scalpel to remove this this looks like the remanence of a large blister. Cannot rule out infection. Culture in this area I did last week showed Staphylococcus lugdunensis few colonies. I am going to try to address this with his [employer] antibiotic that is done so well on the left having linezolid and this should cover the staph 2/1; the patient's wound on his left foot which was the original plantar foot wound thick skin and eschar around the edges even in the total contact cast but the wound surface does not look too bad The real problem is on how his right lateral foot at roughly the base of the fifth metatarsal. The wound is completely necrotic more worrisome than that there is swelling around the edges of this. We have been using silver alginate on both wounds and Keystone on the right foot. Unfortunately I think he is going to require systemic antibiotics while we await cultures. He did not get the x-ray done that we ordered last week [lost the prescription 2/7; disappointingly in the area on the left foot which we are treating with a total contact cast is still  not closed although it is much smaller. He continues  to have a lot of callus around the wound edge. -Right lateral foot culture I did last week was negative x-ray also negative for osteomyelitis. 2/15: TCC silver alginate on the left and silver alginate on the right lateral. No real improvement in either area 05/26/2021: T oday, the wounds are roughly the same size as at his previous visit, post-debridement. He continues to endorse fairly substantial drainage, particularly on the right. He has been in a total contact cast on the left. There is still some callus surrounding this lesion. On the right, the periwound skin is quite macerated, along with surrounding callus. The center of the right-sided wound also has some dark, densely adherent material, which is very difficult to remove. 06/02/2021: Today, both wounds are slightly smaller. He has been using zinc oxide ointment around the right ulcer and the degree of maceration has improved markedly. There continues to be an area of nonviable tissue in the center of the right sided ulcer. The left-sided wound, which has been in the total contact [name] ([id number]) [id number] Page 7 of 21 cast. Appears clean and the degree of callus around it is less than previously. 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 He had to take the stairs and both wounds have increased in size. The left foot, which has been in a total contact cast, has developed a tunnel tracking to the lateral aspect of his foot. The nonviable tissue in the center of the right-sided ulcer remains recalcitrant to debridement. There is significant undermining surrounding the entirety of the left sided wound. 06/16/2021: The elevator at school has been fixed and the patient has been able to avoid putting as much weight on his wounds over the past week. We converted the left foot wound into a  single lesion today, but despite this, the wound is actually smaller. The base is healthy with limited periwound callus. On the right, the central necrotic area is still present. He continues to be quite macerated around the right sided wound, despite applying barrier cream. This does, however, have the benefit of softening the callus to make it more easily removable. 06/23/2021: Today, the left wound is smaller. The lateral aspect that had opened up previously is now closed. The wound base has a healthy bed of granulation tissue and minimal slough. Unfortunately, on the right, the wound is larger and continues to be fairly macerated. He has also reopened the wound at his right ankle. He thinks this is due to the gait he has adopted secondary to his total contact cast and boot. 06/30/2021: T oday, both wounds are a little bit larger. The lateral aspect on the left has remained closed. He continues to have significant periwound maceration. The culture that I took from the right sided wound grew a population of bacteria that is not [facility]. The center of the right- sided wound continues to appear necrotic with nonviable fat. It probes deeper today, but does not reach bone. 07/07/2021: The periwound maceration is a little bit less today. The right lateral foot wound has some areas that appear more viable and the necrotic center also looks a little bit better. The wound on the dorsal surface of his right foot near the ankle is contracting and the surface appears healthy. The left plantar wound surface looks healthy, but there is some new undermining on the medial portion. He did get his new Keystone antibiotic and began applying that  to the right foot wound on Saturday. 07/14/2021: The intake nurse reported substantial drainage from his wounds, but the periwound skin actually looks better than is typical for him. The wound on the dorsal surface of his right foot near the ankle  is smaller and just has a small open area underneath some dried eschar. The left plantar wound surface looks healthy and there has been no significant accumulation of callus. The right lateral foot wound looks quite a bit better, with the central portion, which typically appears necrotic, looking more viable albeit pale. 07/22/2021: His left foot is extremely macerated today. The wound is about the same size. The wound on the dorsal surface of his right foot near the ankle had closed, but he traumatized it removing the dressing and there is a tiny skin tear in that location. The right lateral foot wound is bigger, but the surface appears healthy. 07/30/2021: The wound on the dorsal surface of his right foot near the ankle is closed. The right lateral foot wound again is a little bit bigger due to some undermining. The periwound skin is in better condition, however. He has been applying zinc oxide. The wound surface is a little bit dry today. On the left, he does not have the substantial maceration that we frequently see. The wound itself is smaller and has a clean surface. 08/06/2021: Both wounds seem to have deteriorated over the past week. The right lateral foot wound has a dry surface but the periwound is boggy.. Overall wound dimensions are about the same. On the left, the wound is about the same size, but there is more undermining present underneath periwound callus. 08/13/2021: The right sided wound looks about the same, but on the left there has been substantial deterioration. The undermining continues to extend under periwound callus. Once this was removed, substantial extension of the wound was present. There is no odor or purulent drainage but clearly the wounds have broken down. 08/20/2021: The wounds look about the same today. He has been out of his total contact cast and has just been changing the dressings using topical Keystone with PolyMem Ag, Kerlix and Ace bandages. The wound on the top of  his right ankle has reopened but this is quite small. There was a little bit of purulent material that I expressed when examining this wound. 08/24/2021: After the aggressive debridement I performed at his last visit, the wounds actually look a little bit better today. They are smaller with the exception of the wound on the top of his right ankle which is a little bit bigger as some more skin pulled off when he was changing his dressing. We are using topical Keystone with PolyMem Ag Kerlix and Ace bandages. 09/02/2021: There has been really no change to any of his wounds. 09/16/2021: The patient was hospitalized last week with nausea, vomiting, and dehydration. He says that while he was in the hospital, his wounds were not really addressed properly. T oday, both plantar foot wounds are larger and the periwound skin is macerated. The wound on the dorsum of his right foot has a scab on the top. The right foot now has a crater where previously he had had nonviable fat. It looks as though this simply died and fell out. The periwound callus is wet. 09/24/2021: His wounds have deteriorated somewhat since his last visit. The wound on the dorsum of his right foot near his ankle is larger and has more nonviable tissue present. The crater in his right foot  is even deeper; I cannot quite palpate or probe to bone but I am sure it is close. The wound on his left plantar foot has an odd boggy area in the center that almost feels as though it has fluid within it. He has run out of his topical Keystone antibiotic. We are using silver alginate on his wounds. 09/29/2021: He has developed a new wound on the dorsum of his left foot near his ankle. He says he thinks his wrapping is rubbing in that site. I would concur with this as the wound on his right ankle is larger. The left foot looks about the same. The right foot has the crater that was present last week. No significant slough accumulation, but his foot remains quite  swollen and warm despite oral antibiotic therapy. 10/08/2021: All of his wounds look about the same as last week. He did not start his oral antibiotics that are prescribed until just a couple of days ago; his [employer] compounded antibiotics formula has been changed and he is awaiting delivery of the new recipe. His MRI that was scheduled for earlier this week was canceled as no prior authorization had been obtained; unfortunately the tech responsible sent an email to my old [place] email, which I no longer use nor have access to. 10/18/2021: The wounds on his bilateral dorsal feet near the ankles are both improved. They are smaller and have just some eschar and slough buildup. The left plantar wound has a fair amount of undermining, but the surface is clean. There is some periwound callus accumulation. On the right plantar foot, there is nonviable fat leading to a deep tunnel that tracks towards his dorsal medial foot. There is periwound callus and slough accumulation, as well. His right foot and leg remain swollen as compared to the left. 10/25/2021: The wounds on his bilateral dorsal feet and at the ankles have broken down somewhat. They are little bit larger than last week. The left plantar wound continues to undermine laterally but the surface is clean. The right plantar foot wound shows some decreased depth in the tunnel tracking towards his dorsal medial foot. He has not yet had the Doppler study that I ordered; it sounds like there is some confusion about the scheduling of the procedure. In addition, the MRI was denied and I have taken steps to appeal the denial. 11/24/2021: Since our last visit, Mr. Thibodeaux was admitted to the hospital where an MRI suggested osteomyelitis. He was taken the operating room by podiatry. Bone biopsies were negative for osteomyelitis. They debrided his wounds and applied myriad matrix. He saw them last week and they removed his staples. He is here today to  continue his wound healing process. T oday, both of the dorsal foot/ankle wounds are substantially smaller. There is just a little eschar overlying the left sided wound and some eschar and slough on the right. The right plantar foot ulcer has the healthiest surface of granulation tissue that I have seen to date. A portion of the myriad matrix failed to take and was hanging loose. It appears that myriad morcells were placed into the tunnel closest to the dorsal portion of his foot. These have sloughed off. The left plantar foot ulcer is about the same size, but has a much healthier surface than in the past. Both plantar ulcers have callus and slough accumulation. 12/07/2021: Left dorsal foot/ankle wound is closed. The right dorsal foot/ankle wound is nearly closed and just has a small open area with some  eschar and slough. The right plantar foot wound has contracted quite a bit since our last visit. It has a healthy surface with just a little bit of slough accumulation and [place], [name] ([id number]) [id number] Page 8 of 21 periwound callus buildup. The left plantar foot wound is about the same size but the surface appears healthy. There is a little slough and periwound callus on this side, as well. 12/15/2021: Both dorsal foot/ankle wounds are closed. The right plantar foot wound is substantially smaller than at our last visit. The tunneling that was present has nearly closed. There is just a little bit of slough buildup. The left plantar foot wound is also a little bit smaller today. The surface is the healthiest that I have ever seen it. Light slough and periwound callus accumulation on this side. 12/22/2021: The dorsal ankle/foot wounds remain closed. The right plantar foot wound continues to contract. There is still a bit of depth at the lateral portion of the wound but the surface has a good granulated appearance. The wound on the left is about the same size, the central  indented portion is still adherent. It also is clean with good granulation tissue present. The periwound skin and callus are a bit macerated, however. 12/29/2021: Both ankle wounds remain closed. The right plantar foot wound is less than half the size that it was last week. There is no depth any longer and the surface has just a little bit of slough and periwound callus. On the left, the wound is not much smaller, but the surface is healthier with good granulation tissue. There is also a little slough and periwound callus buildup. 01/05/2022: The right plantar foot wound continues to contract and is once again about half the size as it was last week. There is just a little bit of surface slough and periwound callus. On the left, the depth of the wound has decreased and the diameter has started to contract. It is also very clean with just a little slough and biofilm present. 01/12/2022: The right plantar foot wound is smaller again today. There is just some periwound callus and slough accumulation. On the left, there is a fair amount of undermining and the surface is a little boggy, but no other significant change. 01/19/2022: The right plantar wound continues to contract. There is minimal periwound callus accumulation and just a light layer of slough on the surface. On the left, the surface looks better, but there is fairly significant undermining near the 11:00 portion of the wound, aiming towards his great toe. The skin overlying this area is very healthy and has a good fat layer present. No malodor or purulent drainage. 01/26/2022: The right sided wound continues to contract and has just a light layer of slough on the surface. Minimal periwound callus. On the left, he still has substantial undermining and the tissue surface is not as robust as I would like to see. No malodor or purulent drainage, but he did say he had a lot of serous drainage over the weekend. 02/02/2022: The right foot wound is  about a quarter of the size that it was at his last visit. There is just a little slough on the surface and some periwound callus. On the left, the undermining persists and the tissue is still more pale, but he does not seem to have had as much drainage over the last weekend. We are waiting on a wound VAC. 02/09/2022: His right foot has healed. He received the wound VAC, but did  not bring it to clinic with him today. The lateral aspect of the left foot wound has come in a little, but he still has substantial undermining on the medial and distal portion of the wound. Still with macerated periwound callus. 02/16/2022: The wound VAC was applied last Friday. T oday, there has been tremendous improvement in the surface of the wound bed. The undermined portion of the wound has come in a bit. There is still some overhanging dead skin. Overall the wound looks much better. 02/23/2022: No significant change in the overall wound dimensions, but the wound surface continues to improve and appears healthier and more robust. There is some periwound callus accumulation and overhanging skin. No concern for infection. 03/02/2022: Although the measurements taken in clinic today were unchanged, the visual appearance of the wound looks like it is smaller and the undermining/tunneling is filling with flesh. There is less periwound tissue maceration than usual. No malodor or purulent drainage. No concern for infection. 03/09/2022: The undermining has come in by couple of millimeters. The periwound is a little bit more macerated this week. No concern for infection. 12/13; substantial wound on the left plantar foot. We are using silver collagen and a wound VAC. 03/23/2022: The undermining continues to contract. The wound surface is a little bit dry. 03/30/2022: The undermining has essentially resolved. There is still some depth at the center of the wound. The wound surface has good beefy tissue and the moisture balance is  better. 04/06/2022: The wound dimensions are about the same, except that the depth is beginning to fill in. He has had more drainage this week, but there is no obvious concern for infection. 04/13/2022: He has built up a little bit of callus around the edges of the wound, creating some undermining. Otherwise the wound looks fairly unchanged. 04/20/2022: The wound bed tissue looks very healthy and robust. Light biofilm on the surface. There is some built up wet callus around the wound edges, but no undermining. 04/27/2022: The wound surface continues to look very healthy. Although the wound dimensions measured the same, it looks smaller to me. No real callus accumulation this week. Minimal slough on the surface. 05/04/2022: The wound measured smaller and shallower this week. We are still awaiting insurance approval for [employer]. Electronic Signature(s) Signed: 05/04/2022 9:22:41 AM By: Duanne Guess MD FACS Entered By: Duanne Guess on 05/04/2022 09:22:41 -------------------------------------------------------------------------------- Physical Exam Details Patient Name: Date of Service: [name], [name] 05/04/2022 8:45 A M Medical Record Number: [id number] Patient Account Number: [id number] Date of Birth/Sex: Treating RN: 12-Oct-1985 (36 y.o. M) Primary Care Provider: [name] Other Clinician: Referring Provider: Treating Provider/Extender: Dalene Carrow [place], [name] ([id number]) [id number] Page 9 of 21 Weeks in Treatment: 131 Constitutional . . . . no acute distress. Respiratory Normal work of breathing on room air. Notes 05/04/2022: The wound measured smaller and shallower this week. Electronic Signature(s) Signed: 05/04/2022 9:23:10 AM By: Duanne Guess MD FACS Entered By: Duanne Guess on 05/04/2022 09:23:10 -------------------------------------------------------------------------------- Physician Orders Details Patient  Name: Date of Service: [name], [name] 05/04/2022 8:45 A M Medical Record Number: [id number] Patient Account Number: [id number] Date of Birth/Sex: Treating RN: 09-17-1985 (36 y.o. 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 - Dr. Lady Gary - Room 1 with Bonita Quin Return A Wednesday 2/7 @ 0815 am Bathing/ Shower/ Hygiene May shower and wash wound with soap and water. Negative Presssure Wound Therapy Wound #3 Left,Lateral,Plantar Foot Wound Vac to wound continuously at 160mm/hg pressure Black Foam Edema Control - Lymphedema / SCD / Other Bilateral Lower Extremities Avoid standing for long periods of time. Exercise regularly Compression stocking or Garment 20-30 mm/Hg pressure to: - to both legs daily Off-Loading Open toe surgical shoe to: - Both feet Additional Orders / Instructions Follow Nutritious Diet Non Wound Condition pply the following to affected area as directed: - continue to apply foam donut or cushion to healed right plantar foot A Wound Treatment Wound #3 - Foot Wound Laterality: Plantar, Left, Lateral Cleanser: Soap and Water 3 x Per Week/30 Days Discharge Instructions: May shower and wash wound with dial antibacterial soap and water prior to dressing change. Peri-Wound Care: Skin Prep (Generic) 3 x Per Week/30 Days Discharge Instructions: Use skin prep as directed [place], [name] ([id number]) [id number] Page 10 of 21 Prim Dressing: Promogran Prisma Matrix, 4.34 (sq in) (silver collagen) (Generic) 3 x Per Week/30 Days ary Discharge Instructions:  Moisten collagen with saline or hydrogel Prim Dressing: VAC ary 3 x Per Week/30 Days Secondary Dressing: Zetuvit Plus 4x8 in (Generic) 3 x Per Week/30 Days Discharge Instructions: Apply over primary dressing as directed. Secured With: Elastic Bandage 4 inch (ACE bandage) (Generic) 3 x Per Week/30 Days Discharge Instructions: Secure with ACE bandage as directed. Secured With: 41M Medipore Scientist, research (life sciences) Surgical T 2x10 (in/yd) (Generic) 3 x Per Week/30 Days ape Discharge Instructions: Secure with tape as directed. Compression Wrap: Kerlix Roll 4.5x3.1 (in/yd) (Generic) 3 x Per Week/30 Days Discharge Instructions: Apply Kerlix and Coban compression as directed. Electronic Signature(s) Signed: 05/04/2022 9:31:25 AM By: Duanne Guess MD FACS Entered By: Duanne Guess on 05/04/2022 09:23:33 -------------------------------------------------------------------------------- Problem List Details Patient Name: Date of Service: [name], [name] 05/04/2022 8:45 A M Medical Record Number: [id number] Patient Account Number: [id number] Date of Birth/Sex: Treating RN: Apr 16, 1985 (36 y.o. [name] Primary Care Provider: [name] Other Clinician: Referring Provider: Treating Provider/Extender: [name] in Treatment: 131 Active Problems ICD-10 Encounter Code Description Active Date MDM Diagnosis L97.528 Non-pressure chronic ulcer of other part of left foot with other specified 10/24/2019 No Yes severity E11.621 Type 2 diabetes mellitus with foot ulcer 10/24/2019 No Yes Inactive Problems ICD-10 Code Description Active Date Inactive Date L97.518 Non-pressure chronic ulcer of other part of right foot with other specified severity 07/14/2020 07/14/2020 L97.518 Non-pressure chronic ulcer of other part of right foot with other specified severity 10/24/2019 10/24/2019 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, right ankle and foot 10/24/2019 10/24/2019 L97.318 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 08/10/2020 08/10/2020 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, left ankle and foot 10/24/2019 10/24/2019 [place], [name] ([id number]) [id number] Page 11 of 21 L97.322 Non-pressure chronic ulcer of left ankle with fat layer exposed 09/29/2021 09/29/2021 B95.62 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 10/24/2019 10/24/2019 classified elsewhere Resolved Problems Electronic Signature(s) Signed: 05/04/2022 9:21:42 AM By: Duanne Guess MD FACS Entered By: Duanne Guess on 05/04/2022 09:21:42 -------------------------------------------------------------------------------- Progress Note Details Patient Name: Date of Service: [name], [name] 05/04/2022 8:45 A M Medical Record Number: [id number] Patient Account Number: [id number] Date of Birth/Sex: Treating RN: 1986/03/11 (36 y.o. 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 He comes in for review of a wound over the plantar fifth metatarsal head extending into the lateral part of the foot. He was followed for this previously by his podiatrist Dr. Darreld Mclean. As the patient tells his story he went to see podiatry first for a swelling he developed on the lateral part of his fifth metatarsal head in May. He states this was "open" by podiatry and the  area closed. He was followed up in June and it was again opened callus removed and it closed promptly. There were plans being made for surgery on the fifth metatarsal head in June however his blood sugar was apparently too high for anesthesia. Apparently the area was debrided and opened again in June and it is never closed since. Looking over the records from podiatry I am really not able to follow this. It was clear when he was first seen it was before 5/14 at that point he already had a wound. By 5/17 the ulcer was resolved. I do not see anything about a procedure. On 5/28 noted to have pre-ulcerative moderate keratosis. X-ray noted 1/5 contracted toe and tailor's bunion and metatarsal deformity. On a visit date on 09/28/2018 the dorsal part of the left foot it healed and resolved. There was concern about swelling in his lower extremity he was sent to the ER.. As far as I can tell he was seen in the ER on 7/12 with an ulcer on his left foot. A DVT rule out of the left leg was negative. I do not think I have complete records from podiatry but I am not able to verify the procedures this patient states he had. He states after the last procedure the wound has never closed although I am not able to follow this in the records I have from podiatry. He has not had a recent x-ray The patient has been using Neosporin on the wound. He is wearing a Darco shoe. He is still very active up on his foot working and exercising. Past medical history; type 2 diabetes ketosis-prone, leg swelling with a negative DVT study in July. Non-smoker ABI in our clinic was 0.85 on the left 10/16; substantial wound on the plantar left fifth met head extending laterally almost to the dorsal fifth MTP. We have been using silver alginate we gave him a Darco forefoot off loader. An x-ray did not show evidence of osteomyelitis did note soft tissue emphysema which I think was due to gas tracking through an open wound. There is no doubt in  my mind he requires an MRI 10/23; MRI not booked until 3 November at the earliest this is largely due to his glucose sensor in the right arm. We have been using silver alginate. There has been an improvement 10/29; I am still not exactly sure when his MRI is booked for. He says it is the third but it is the 10th in epic. This definitely needs to be done. He is running a low-grade fever today but no other symptoms. No real improvement in the 1  02/26/2019 patient presents today for a follow-up visit here in our clinic he is last been seen in the clinic on October 29. Subsequently we were working on getting MRI to evaluate and see what exactly was going on and where we would need to go from the standpoint of whether or not he had osteomyelitis and again what treatments were going be required. Subsequently the patient ended up being admitted to the hospital on 02/07/2019 and was discharged on 02/14/2019. This is a somewhat interesting admission with a discharge diagnosis of pneumonia due to COVID-19 although he was positive for COVID-19 when tested at the urgent care but negative x2 when he was actually in the hospital. With that being said he did have acute respiratory failure with hypoxia and it was noted he also have a left foot ulceration with osteomyelitis. With that being said he did require oxygen for his pneumonia and I level 4 L. He was placed on antivirals and steroids for the COVID-19. He was also transferred to the [facility] at one point. Nonetheless he did subsequently discharged home and since being home has done much better in that regard. The CT angiogram did not show any pulmonary embolism. With regard to the osteomyelitis the patient was placed on vancomycin and Zosyn while in the hospital but has been changed to Augmentin at discharge. It was also recommended that he follow- up with wound care and podiatry. Podiatry however wanted him to see [name] according to the patient prior to  them doing anything further. His hemoglobin A1c was 9.9 as noted in the hospital. Have an MRI of the left foot performed while in the hospital on 02/04/2019. 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 There is an overlying plantar open wound noted an abscess tracking back along the lateral aspect of the fifth metatarsal shaft. There is otherwise diffuse cellulitis and mild fasciitis without findings of polymyositis. The patient did [place], [name] ([id number]) [id number] Page 12 of 21 have recently pneumonia secondary to COVID-19 I looked in the chart through epic and it does appear that the patient may need to have an additional x-ray just to ensure everything is cleared and that he has no airspace disease prior to putting him into the chamber. 03/05/2019; patient was readmitted to the clinic last week. He was hospitalized twice for a viral upper respiratory tract infection from 11/1 through 11/4 and then 11/5 through 11/12 ultimately this turned out to be Covid pneumonitis. Although he was discharged on oxygen he is not using it. He says he feels fine. He has no exercise limitation no cough no sputum. His O2 sat in our clinic today was 100% on room air. 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 He received Vanco and Zosyn in the hospital and then was discharged on 2 weeks of Augmentin. I do not see any relevant cultures. He was supposed to follow-up with infectious disease but I do not see that he has an appointment. 12/8; patient saw Dr. Ronnie Derby of infectious disease last week. He felt that he had had adequate antibiotic therapy. He did not go to follow-up with Dr. Logan Bores of podiatry and I have again talked to him about the pros and cons of this. He does not want to  consider a ray amputation of this time. He is  aware of the risks of recurrence, migration etc. He started HBO today and tolerated this well. He can complete the Augmentin that I gave him last week. I have looked over the lab work that Dr. Effie Shy ordered his C-reactive protein was 3.3 and his sedimentation rate was 17. The C-reactive protein is never really been measurably that high in this patient 12/15; not much change in the wound today however he has undermining along the lateral part of the foot again more extensively than last week. He has some rims of epithelialization. We have been using silver alginate. He is undergoing hyperbarics but did not dive today 12/18; in for his obligatory first total contact cast change. Unfortunately there was pus coming from the undermining area around his fifth metatarsal head. This was cultured but will preclude reapplication of a cast. He is seen in conjunction with HBO 12/24; patient had staph lugdunensis in the wound in the undermining area laterally last time. We put him on doxycycline which should have covered this. The wound looks better today. I am going to give him another week of doxycycline before reattempting the total contact cast 12/31; the patient is completing antibiotics. Hemorrhagic debris in the distal part of the wound with some undermining distally. He also had hyper granulation. Extensive debridement with a #5 curette. The infected area that was on the lateral part of the fifth met head is closed over. I do not think he needs any more antibiotics. Patient was seen prior to HBO. Preparations for a total contact cast were made in the cast will be placed post hyperbarics 04/11/19; once again the patient arrives today without complaint. He had been in a cast all week noted that he had heavy drainage this week. This resulted in large raised areas of macerated tissue around the wound 1/14; wound bed looks better slightly smaller. Hydrofera Blue has  been changing himself. He had a heavy drainage last week which caused a lot of maceration around the wound so I took him out of a total contact cast he says the drainage is actually better this week He is seen today in conjunction with HBO 1/21; returns to clinic. He was up in [place] for a day or 2 attending a funeral. He comes back in with the wound larger and with a large area of exposed bone. He had osteomyelitis and septic arthritis of the fifth left metatarsal head while he was in hospital. He received IV antibiotics in the hospital for a prolonged period of time then 3 weeks of Augmentin. Subsequently I gave him 2 weeks of doxycycline for more superficial wound infection. When I saw this last week the wound was smaller the surface of the wound looks satisfactory. 1/28; patient missed hyperbarics today. Bone biopsy I did last time showed Enterococcus faecalis and Staphylococcus lugdunensis . He has a wide area of exposed bone. We are going to use silver alginate as of today. I had another ethical discussion with the patient. 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 In this situation I think the likelihood of healing this is low. Therefore I have recommended a ray amputation and with the patient's agreement I have referred him to Dr. Victorino Dike. The other issue is that his compliance with hyperbarics has been minimal because of his work schedule and given his underlying decision I am going to stop this today READMISSION 10/24/2019 MRI 09/29/2019 left foot IMPRESSION: 1. Apparent skin ulceration inferior and lateral to the 5th metatarsal base with  underlying heterogeneous T2 signal and enhancement in the subcutaneous fat. Small peripherally enhancing fluid collections along the plantar and lateral aspects of the 5th metatarsal base suspicious for abscesses. 2. Interval amputation through the mid 5th metatarsal with nonspecific low-level marrow edema and  enhancement. Given the proximity to the adjacent soft tissue inflammatory changes, osteomyelitis cannot be excluded. 3. The additional bones appear unremarkable. MRI 09/29/2019 right foot IMPRESSION: 1. Soft tissue ulceration lateral to the 5th MTP joint. There is low-level T2 hyperintensity within the 4th and 5th metatarsal heads and adjacent proximal phalanges without abnormal T1 signal or cortical destruction. These findings are nonspecific and could be seen with early marrow edema, hyperemia or early osteomyelitis. No evidence of septic joint. 2. Mild tenosynovitis and synovial enhancement associated with the extensor digitorum tendons at the level of the midfoot. 3. Diffuse low-level muscular T2 hyperintensity and enhancement, most consistent with diabetic myopathy. LEFT FOOT BONE Methicillin resistant staphylococcus aureus Staphylococcus lugdunensis MIC MIC CIPROFLOXACIN >=8 RESISTANT Resistant <=0.5 SENSI... Sensitive CLINDAMYCIN <=0.25 SENS... Sensitive >=8 RESISTANT Resistant ERYTHROMYCIN >=8 RESISTANT Resistant >=8 RESISTANT Resistant GENTAMICIN <=0.5 SENSI... Sensitive <=0.5 SENSI... Sensitive Inducible Clindamycin NEGATIVE Sensitive NEGATIVE Sensitive OXACILLIN >=4 RESISTANT Resistant 2 SENSITIVE Sensitive RIFAMPIN <=0.5 SENSI... Sensitive <=0.5 SENSI... Sensitive TETRACYCLINE <=1 SENSITIVE Sensitive <=1 SENSITIVE Sensitive TRIMETH/SULFA <=10 SENSIT Sensitive <=10 SENSIT Sensitive ... Marland Kitchen.. VANCOMYCIN 1 SENSITIVE Sensitive <=0.5 SENSI... Sensitive Right foot bone . Component 3 wk ago [place], [name] ([id number]) [id number] Page 13 of 21 Specimen Description BONE Special Requests RIGHT 4 METATARSAL SAMPLE B Gram Stain NO WBC SEEN NO ORGANISMS SEEN Culture RARE METHICILLIN RESISTANT STAPHYLOCOCCUS AUREUS NO ANAEROBES ISOLATED Performed at [facility], [address] [place], [place] [address] Report Status 10/08/2019 FINAL Organism  ID, Bacteria METHICILLIN RESISTANT STAPHYLOCOCCUS AUREUS Resulting Agency [facility] Susceptibility Methicillin resistant staphylococcus aureus MIC CIPROFLOXACIN >=8 RESISTANT Resistant CLINDAMYCIN <=0.25 SENS... Sensitive ERYTHROMYCIN >=8 RESISTANT Resistant GENTAMICIN <=0.5 SENSI... Sensitive Inducible Clindamycin NEGATIVE Sensitive OXACILLIN >=4 RESISTANT Resistant RIFAMPIN <=0.5 SENSI... Sensitive TETRACYCLINE <=1 SENSITIVE Sensitive TRIMETH/SULFA <=10 SENSIT Sensitive ... VANCOMYCIN 1 SENSITIVE Sensitive This is a patient we had in clinic earlier this year with a wound over his left fifth metatarsal head. 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 Victorino Dike on 05/16/2019. He seems to have developed problems with wounds on his bilateral feet in June 2021 although he may have had problems earlier than this. He was in an urgent care with a right foot ulcer on 09/26/2019 and given a course of doxycycline. This was apparently after having trouble getting into see orthopedics. He was seen by podiatry on 09/28/2019 noted to have bilateral lower extremity ulcers including the left lateral fifth metatarsal base and the right subfifth met head. It was noted that had purulent drainage at that time. He required hospitalization from 6/20 through 7/2. This was because of worsening right foot wounds. He underwent bilateral operative incision and drainage and bone biopsies bilaterally. Culture results are listed above. He has been referred back to clinic by Dr. Ardelle Anton of podiatry. He is also followed by Dr. Orvan Falconer who saw him yesterday. He was discharged from hospital on Zyvox Flagyl and Levaquin and yesterday changed  to doxycycline Flagyl and Levaquin. His inflammatory markers on 6/26 showed  a sedimentation rate of 129 and a C-reactive protein of 5. This is improved to 14 and 1.3 respectively. This would indicate improvement. ABIs in our clinic today were 1.23 on the right and 1.20 on the left 11/01/2019 on evaluation today patient appears to be doing fairly well in regard to the wounds on his feet at this point. Fortunately there is no signs of active infection at this time. No fevers, chills, nausea, vomiting, or diarrhea. He currently is seeing infectious disease and still under their care at this point. Subsequently he also has both wounds which she has not been using collagen on as he did not receive that in his packaging he did not call us and let us know that. Apparently that just was missed on the order. Nonetheless we will get that straightened out today. 8/9-Patient returns for bilateral foot wounds, using Prisma with hydrogel moistened dressings, and the wounds appear stable. Patient using surgical shoes, avoiding much pressure or weightbearing as much as possible 8/16; patient has bilateral foot wounds. 1 on the right lateral foot proximally the other is on the left mid lateral foot. Both required debridement of callus and thick skin around the wounds. We have been using silver collagen 8/27; patient has bilateral lateral foot wounds. The area on the left substantially surrounded by callus and dry skin. This was removed from the wound edge. The underlying wound is small. The area on the right measured somewhat smaller today. 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 Unfortunately I did not update his antibiotics during today's visit. 9/10 I reviewed Dr. Hale Bogus last notes he felt he had completed antibiotics his inflammatory markers were reasonably well controlled. He has a small wound on the lateral left foot and a tiny area on  the right which is just above closed. He is using Hydrofera Blue with border foam he has bilateral surgical shoes 9/24; 2 week f/u. doing well. right foot is closed. left foot still undermined. 10/14; right foot remains closed at the fifth met head. The area over the base of the left fifth metatarsal has a small open area but considerable undermining towards the plantar foot. Thick callus skin around this suggests an adequate pressure relief. We have talked about this. He says he is going to go back into his cam boot. I suggested a total contact cast he did not seem enamored with this suggestion 10/26; left foot base of the fifth metatarsal. Same condition as last time. He has skin over the area with an open wound however the skin is not adherent. He went to see Dr. Earleen Newport who did an x-ray and culture of his foot I have not reviewed the x-ray but the patient was not told anything. He is on doxycycline 11/11; since the patient was last here he was in the emergency room on 10/30 he was concerned about swelling in the left foot. They did not do any cultures or x-rays. They changed his antibiotics to cephalexin. Previous culture showed group B strep. The cephalexin is appropriate as doxycycline has less than predictable coverage. Arrives in clinic today with swelling over this area under the wound. He also has a new wound on the right fifth metatarsal head 11/18; the patient has a difficult wound on the lateral aspect of the left fifth metatarsal head. The wound was almost ballotable last week I opened it slightly expecting to see purulence however there was just bleeding. I cultured this  this was negative. X-ray unchanged. We are trying to get an MRI but I am not sure were going to be able to get this through his insurance. He also has an area on the right lateral fifth metatarsal head this looks healthier 12/3; the patient finally got our MRI. Surprisingly this did not show osteomyelitis. I did show the  soft tissue ulceration at the lateral plantar aspect of the fifth metatarsal base with a tiny residual 6 mm abscess overlying the superficial fascia I have tried to culture this area I have not been able to get this to grow anything. Nevertheless the protruding tissue looks aggravated. I suspect we should try to treat the underlying "abscess with broad-spectrum antibiotics. I am going to start him on Levaquin and Flagyl. He has much less edema in his legs and I am going to continue to wrap his legs and see him weekly 12/10. I started Levaquin and Flagyl on him last week. He just picked up the Flagyl apparently there was some delay. The worry is the wound on the left fifth metatarsal base which is substantial and worsening. His foot looks like he inverts at the ankle making this a weightbearing surface. Certainly no improvement in fact I think the measurements of this are somewhat worse. We have been using 12/17; he apparently just got the Levaquin yesterday this is 2 weeks after the fact. He has completed the Flagyl. The area over the left fifth metatarsal base still has protruding granulation tissue although it does not look quite as bad as it did some weeks ago. He has severe bilateral lymphedema although we have not been treating him for wounds on his legs this is definitely going to require compression. There was so much edema in the left I did not wish to put him in a total contact cast today. I am going to increase his compression from 3-4 layer. The area on the right lateral fifth met head actually look quite good and superficial. 12/23; patient arrived with callus on the right fifth met head and the substantial hyper granulated callused wound on the base of his fifth metatarsal. He says he is completing his Levaquin in 2 days but I do not think that adds up with what I gave him but I will have to double check this. We are using Hydrofera Blue on both areas. My plan is to put the left leg in a  cast the week after New Year's 04/06/2020; patient's wounds about the same. Right lateral fifth metatarsal head and left lateral foot over the base of the fifth metatarsal. There is undermining on the left lateral foot which I removed before application of total contact cast continuing with Hydrofera Blue new. Patient tells me he was seen by endocrinology today lab work was done [Dr. Kerr]. Also wondering whether he was referred to cardiology. I went over some lab work from previously does not have chronic renal failure certainly not nephrotic range proteinuria he does have very poorly controlled diabetes but this is not his most updated lab work. Hemoglobin A1c has been over 11 1/10; the patient had a considerable amount of leakage towards mid part of his left foot with macerated skin however the wound surface looks better the area [place], [name] ([id number]) [id number] Page 14 of 21 on the right lateral fifth met head is better as well. I am going to change the dressing on the left foot under the total contact cast to silver alginate, continue with Hydrofera Blue on the  right. 1/20; patient was in the total contact cast for 10 days. Considerable amount of drainage although the skin around the wound does not look too bad on the left foot. The area on the right fifth metatarsal head is closed. Our nursing staff reports large amount of drainage out of the left lateral foot wound 1/25; continues with copious amounts of drainage described by our intake staff. PCR culture I did last week showed E. coli and Enterococcus faecalis and low quantities. Multiple resistance genes documented including extended spectrum beta lactamase, MRSA, MRSE, quinolone, tetracycline. The wound is not quite as good this week as it was 5 days ago but about the same size 2/3; continues with copious amounts of malodorous drainage per our intake nurse. The PCR culture I did 2 weeks ago showed E. coli and  low quantities of Enterococcus. There were multiple resistance genes detected. I put Neosporin on him last week although this does not seem to have helped. The wound is slightly deeper today. Offloading continues to be an issue here although with the amount of drainage she has a total contact cast is just not going to work 2/10; moderate amount of drainage. Patient reports he cannot get his stocking on over the dressing. I told him we have to do that the nurse gave him suggestions on how to make this work. The wound is on the bottom and lateral part of his left foot. Is cultured predominantly grew low amounts of Enterococcus, E. coli and anaerobes. There were multiple resistance genes detected including extended spectrum beta lactamase, quinolone, tetracycline. I could not think of an easy oral combination to address this so for now I am going to do topical antibiotics provided by [facility] I think the main agents here are vancomycin and an aminoglycoside. We have to be able to give him access to the wounds to get the topical antibiotic on 2/17; moderate amount of drainage this is unchanged. He has his Keystone topical antibiotic against the deep tissue culture organisms. He has been using this and changing the dressing daily. Silver alginate on the wound surface. 2/24; using Keystone antibiotic with silver alginate on the top. He had too much drainage for a total contact cast at one point although I think that is improving and I think in the next week or 2 it might be possible to replace a total contact cast I did not do this today. In general the wound surface looks healthy however he continues to have thick rims of skin and subcutaneous tissue around the wide area of the circumference which I debrided 06/04/2020 upon evaluation today patient appears to be doing well in regard to his wound. I do feel like he is showing signs of improvement. There is little bit of callus and dead tissue around the edges  of the wound as well as what appears to be a little bit of a sinus tract that is off to the side laterally I would perform debridement to clear that away today. 3/17; left lateral foot. The wound looks about the same as I remember. Not much depth surface looks healthy. No evidence of infection 3/25; left lateral foot. Wound surface looks about the same. Separating epithelium from the circumference. There really is no evidence of infection here however not making progress by my view 3/29; left lateral foot. Surface of the wound again looks reasonably healthy still thick skin and subcutaneous tissue around the wound margins. There is no evidence of infection. One of the concerns being  brought up by the nurses has again the amount of drainage vis--vis continued use of a total contact cast 4/5; left lateral foot at roughly the base of the fifth metatarsal. Nice healthy looking granulated tissue with rims of epithelialization. The overall wound measurements are not any better but the tissue looks healthy. The only concern is the amount of drainage although he has no surrounding maceration with what we have been doing recently to absorb fluid and protect his skin. He also has lymphedema. 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 He states that the kerlix/coban under the TCC rolled down and could not get it back up. He has been using an offloading scooter and has somehow hurt his right foot using this device. This happened last week. He states that the side of his right foot developed a blister and opened. The top of his foot also has a few small open wounds he thinks is due to his socks rubbing in his shoes. He has not been using any dressings to the wound. He  denies purulent drainage, fever/chills or erythema to the wounds. 4/22; patient presents for 1 week follow-up. He developed new wounds to the right foot that were evaluated at last clinic visit. He continues to have a total contact cast to the left leg and he reports no issues. He has been using silver collagen to the right foot wounds with no issues. He denies purulent drainage, fever/chills or erythema to the right foot wounds. He has no complaints today 4/25; patient presents for 1 week follow-up. He has a total contact cast of the left leg and reports no issues. He has been using silver alginate to the right foot wound. He denies purulent drainage, fever/chills or erythema to the right foot wounds. 5/2 patient presents for 1 week follow-up. T contact cast on the left. The wound which is on the base of the plantar foot at the base of the fifth metatarsal otal actually looks quite good and dimensions continue to gradually contract. HOWEVER the area on the right lateral fifth metatarsal head is much larger than what I remember from 2 weeks ago. Once more is he has significant levels of hypergranulation. Noteworthy that he had this same hyper granulated response on his wound on the left foot at one point in time. So much so that he I thought there was an underlying fluid collection. Based on this I think this just needs debridement. 5/9; the wound on the left actually continues to be gradually smaller with a healthy surface. Slight amount of drainage and maceration of the skin around but not too bad. However he has a large wound over the right fifth metatarsal head very much in the same configuration as his left foot wound was initially. I used silver nitrate to address the hyper granulated tissue no mechanical debridement 5/16; area on the left foot did not look as healthy this week deeper thick surrounding macerated skin and subcutaneous tissue. oo The area on the right foot fifth met head was  about the same oo The area on the right ankle that we identified last week is completely broken down into an open wound presumably a stocking rubbing issue 5/23; patient has been using a total contact  cast to the left side. He has been using silver alginate underneath. He has also been using silver alginate to the right foot wounds. He has no complaints today. He denies any signs of infection. 5/31; the left-sided wound looks some better measure smaller surface granulation looks better. We have been using silver alginate under the total contact cast oo The large area on his right fifth met head and right dorsal foot look about the same still using silver alginate 6/6; neither side is good as I was hoping although the surface area dimensions are better. A lot of maceration on his left and right foot around the wound edge. Area on the dorsal right foot looks better. He says he was traveling. I am not sure what does the amount of maceration around the plantar wounds may be drainage issues 6/13; in general the wound surfaces look quite good on both sides. Macerated skin and raised edges around the wound required debridement although in general especially on the left the surface area seems improved. oo The area on the right dorsal ankle is about the same I thought this would not be such a problem to close 6/20; not much change in either wound although the one on the right looks a little better. Both wounds have thick macerated edges to the skin requiring debridements. We have been using silver alginate. The area on the dorsal right ankle is still open I thought this would be closed. 6/28; patient comes in today with a marked deterioration in the right foot wound fifth met head. Wide area of exposed bone this is a drastic change from last time. The area on the left there we have been casting is stagnant. We have been using silver alginate in both wound areas. 7/5; bone culture I did for PCR last time  was positive for Pseudomonas, group B strep, Enterococcus and Staph aureus. There was no suggestion of methicillin resistance or ampicillin resistant genes. This was resistant to tetracycline however He comes into the clinic today with the area over his right plantar fifth metatarsal head which had been doing so well 2 weeks ago completely necrotic feeling bone. I do not know that this is going to be salvageable. The left foot wound is certainly no smaller but it has a better surface and is superficial. 7/8; patient called in this morning to say that his total contact cast was rubbing against his foot. He states he is doing fine overall. He denies signs of infection. 7/12; continued deterioration in the wound over the right fifth metatarsal head crumbling bone. This is not going to be salvageable. The patient agrees and wants to be referred to Dr. Victorino Dike which we will attempt to arrange as soon as possible. I am going to continue him on antibiotics as long as that takes so I will renew those today. The area on the left foot which is the base of the fifth metatarsal continues to look somewhat better. Healthy looking tissue no depth no debridement is necessary here. [place], [name] ([id number]) [id number] Page 15 of 21 7/20; the patient was kindly seen by Dr. Victorino Dike of orthopedics on 10/19/2020. 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 Towards this end we have taken him out of the total contact cast on the left we will put him in a wrap with Hydrofera Blue. As I understand things surgery is planned for 7/21  7/27; patient had his surgery last Thursday. 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 Apparently everything went well we did not still disturb that today The area on the left foot actually looks quite good. He has been much less mobile which probably explains this he did not  seem to do well in the total contact cast secondary to drainage and maceration I think. 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 Fortunately there is no evidence of active infection at this time. No fevers, chills, nausea, vomiting, or diarrhea. Overall I think that he is actually doing extremely well. Nonetheless I do believe that he is staying off of this more following the surgery in his right foot that is the reason the left is doing so great. 8/16; left plantar foot wound. This looks smaller than the last time I saw this he is using Hydrofera Blue. The surgical wound on the right foot is being followed by Dr. Victorino Dike we did not look at this today. He has surgical shoes on both feet 8/23; left plantar foot wound not as good this week. Surrounding macerated skin and subcutaneous tissue everything looks moist and wet. I do not think he is offloading this adequately. He is using a surgical shoe Apparently the right foot surgical wound is not open although I did not check his foot 8/31; left plantar foot lateral aspect. Much improved this week. He has no maceration. Some improvement in the surface area of the wound but most impressively the depth is come in we are using silver alginate. The patient is a Veterinary surgeon. He is asked that we write him a letter so he can go back to work. I have also tried to see if we can write something that will allow him to limit the amount of time that he is on his foot at work. 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 Hopefully the latter can be avoided 9/6; I believe the patient missed an appointment last week. He arrives in today with a wound looking roughly the same certainly no better. Undermining laterally and also inferiorly. We used molecuLight today in  training with the patient's permission.. We are using silver alginate 9/21 wound is measuring bigger this week although this may have to do with the aggressive circumferential debridement last week in response to the blush fluorescence on the MolecuLight. Culture I did last week showed significant MSSA and E. coli. I put him on Augmentin but he has not started it yet. We are also going to send this for compounded antibiotics at [facility]. There is no evidence of systemic infection 9/29; silver alginate. His Keystone arrived. He is completing Augmentin in 2 days. Offloading in a cam boot. Moderate drainage per our intake staff 10/5; using silver alginate. He has been using his [place]. He has completed his Augmentin. Per our intake nurse still a lot of drainage, far too much to consider a total contact cast. Wound measures about the same. He had the same undermining area that I defined last week from a roughly 11-3. I remove this today 10/12; using silver alginate he is using the [place]. He comes in for a nurse visit hence we are applying Jodie Echevaria twice a week. Measuring slightly better today and less notable drainage. Extensive debridement of the wound edge  last time 10/18; using topical Keystone and silver alginate and a soft cast. Wound measurements about the same. Drainage was through his soft cast. We are changing this twice a week Tuesdays and Friday 10/25; comes in with moderate drainage. Still using Keystone silver alginate and a soft cast. Wound dimensions completely the same.He has a lot of edema in the left leg he has lymphedema. Asking for us to consider wrapping him as he cannot get his stocking on over the soft cast 11/2; comes in with moderate to large drainage slightly smaller in terms of width [facility] been [facility]. His wound looks satisfactory but not much improvement 11/4; patient presents today for obligatory cast change. Has no issues or complaints today. He denies signs  of infection. 11/9; patient traveled this weekend to [place], was on the cast quite a bit. Staining of the cast with black material from his walking boot. Drainage was not quite as bad as we feared. Using silver alginate and Keystone 11/16; we do not have size for cast therefore we have been putting a soft cast on him since the change on Friday. Still a significant amount of drainage necessitating changing twice a week. We have been using the Keystone at cast changes either hard or soft as well as silver alginate Comes in the clinic with things actually looking fairly good improvement in width. He says his offloading is about the same 02/24/2021 upon evaluation today patient actually comes back in and is doing excellent in regard to his foot ulcer this is significantly smaller even compared to the last visit. The soft cast seems to have done extremely well for him which is great news. I do not see any signs of infection minimal debridement will be needed today. 11/30; left lateral foot much improved half a centimeter improvement in surface area. No evidence of infection. He seems to be doing better with the soft cast in the TCC therefore we will continue with this. He comes back in later in the week for a change with the nurses. This is due to drainage 12/6; no improvement in dimensions. Under illumination some debris on the surface we have been using silver alginate, soft cast. If there is anything optimistic here he seems to have have less drainage 12/13. Dimensions are improved both length and width and slightly in depth. Appears to be quite healthy today. Raised edges of this thick skin and callus around the edges however. He is in a soft cast were bringing him back once for a change on Friday. Drainage is better 12/20. Dimensions are improved. He still has raised edges of thick skin and callus around the edges. We are using a soft cast 12/28; comes in today with thick callus around the wound. Using  silver under alginate under a soft cast. I do not think there is much improvement in any measurement 2023 04/06/2021; patient was put in a total contact cast. Unfortunately not much change in surface area 1/10; not much different still thick callus and skin around the edge in spite of the total contact cast. This was just debrided last week we have been [facility] and silver alginate under a total contact cast 1/18 the patient's wound on the left side is doing nicely. smaller HOWEVER he comes in today with a wound on the right foot laterally. blister most likely serosangquenous drainage 1/24; the patient continues to do well in terms of the plantar left foot which is continued to contract using silver  alginate under the total contact cast HOWEVER the right lateral foot is bigger with denuded skin around the edges. I used pickups and a #15 scalpel to remove this this looks like the remanence of a large blister. Cannot rule out infection. Culture in this area I did last week showed Staphylococcus lugdunensis few colonies. I am going to try to address this with his Jodie Echevaria antibiotic that is done so well on the left having linezolid and this should cover the staph 2/1; the patient's wound on his left foot which was the original plantar foot wound thick skin and eschar around the edges even in the total contact cast but the wound surface does not look too bad The real problem is on how his right lateral foot at roughly the base of the fifth metatarsal. The wound is completely necrotic more worrisome than that there is swelling around the edges of this. We have been using silver alginate on both wounds and Keystone on the right foot. Unfortunately I think he is going to require systemic antibiotics while we await cultures. He did not get the x-ray done that we ordered last week [lost the prescription 2/7; disappointingly in the area on the left foot which we are treating with  a total contact cast is still not closed although it is much smaller. He continues to have a lot of callus around the wound edge. -Right lateral foot culture I did last week was negative x-ray also negative for osteomyelitis. [place], [name] ([id number]) [id number] Page 16 of 21 2/15: TCC silver alginate on the left and silver alginate on the right lateral. No real improvement in either area 05/26/2021: T oday, the wounds are roughly the same size as at his previous visit, post-debridement. He continues to endorse fairly substantial drainage, particularly on the right. He has been in a total contact cast on the left. There is still some callus surrounding this lesion. On the right, the periwound skin is quite macerated, along with surrounding callus. The center of the right-sided wound also has some dark, densely adherent material, which is very difficult to remove. 06/02/2021: Today, both wounds are slightly smaller. He has been using zinc oxide ointment around the right ulcer and the degree of maceration has improved markedly. There continues to be an area of nonviable tissue in the center of the right sided ulcer. The left-sided wound, which has been in the total contact cast. Appears clean and the degree of callus around it is less than previously. 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 He had to take the stairs and both wounds have increased in size. The left foot, which has been in a total contact cast, has developed a tunnel tracking to the lateral aspect of his foot. The nonviable tissue in the center of the right-sided ulcer remains recalcitrant to debridement. There is significant undermining surrounding the entirety of the left sided wound. 06/16/2021: The elevator at school has been fixed and the patient has been able to avoid putting as much weight on his wounds over the past week. We converted  the left foot wound into a single lesion today, but despite this, the wound is actually smaller. The base is healthy with limited periwound callus. On the right, the central necrotic area is still present. He continues to be quite macerated around the right sided wound, despite applying barrier cream. This does, however, have the benefit of softening the callus to make it more  easily removable. 06/23/2021: Today, the left wound is smaller. The lateral aspect that had opened up previously is now closed. The wound base has a healthy bed of granulation tissue and minimal slough. Unfortunately, on the right, the wound is larger and continues to be fairly macerated. He has also reopened the wound at his right ankle. He thinks this is due to the gait he has adopted secondary to his total contact cast and boot. 06/30/2021: T oday, both wounds are a little bit larger. The lateral aspect on the left has remained closed. He continues to have significant periwound maceration. The culture that I took from the right sided wound grew a population of bacteria that is not covered by his current [facility] antibiotic. The center of the right- sided wound continues to appear necrotic with nonviable fat. It probes deeper today, but does not reach bone. 07/07/2021: The periwound maceration is a little bit less today. The right lateral foot wound has some areas that appear more viable and the necrotic center also looks a little bit better. The wound on the dorsal surface of his right foot near the ankle is contracting and the surface appears healthy. The left plantar wound surface looks healthy, but there is some new undermining on the medial portion. He did get his new Keystone antibiotic and began applying that to the right foot wound on Saturday. 07/14/2021: The intake nurse reported substantial drainage from his wounds, but the periwound skin actually looks better than is typical for him. The wound on the dorsal surface of  his right foot near the ankle is smaller and just has a small open area underneath some dried eschar. The left plantar wound surface looks healthy and there has been no significant accumulation of callus. The right lateral foot wound looks quite a bit better, with the central portion, which typically appears necrotic, looking more viable albeit pale. 07/22/2021: His left foot is extremely macerated today. The wound is about the same size. The wound on the dorsal surface of his right foot near the ankle had closed, but he traumatized it removing the dressing and there is a tiny skin tear in that location. The right lateral foot wound is bigger, but the surface appears healthy. 07/30/2021: The wound on the dorsal surface of his right foot near the ankle is closed. The right lateral foot wound again is a little bit bigger due to some undermining. The periwound skin is in better condition, however. He has been applying zinc oxide. The wound surface is a little bit dry today. On the left, he does not have the substantial maceration that we frequently see. The wound itself is smaller and has a clean surface. 08/06/2021: Both wounds seem to have deteriorated over the past week. The right lateral foot wound has a dry surface but the periwound is boggy.. Overall wound dimensions are about the same. On the left, the wound is about the same size, but there is more undermining present underneath periwound callus. 08/13/2021: The right sided wound looks about the same, but on the left there has been substantial deterioration. The undermining continues to extend under periwound callus. Once this was removed, substantial extension of the wound was present. There is no odor or purulent drainage but clearly the wounds have broken down. 08/20/2021: The wounds look about the same today. He has been out of his total contact cast and has just been changing the dressings using topical Keystone with PolyMem Ag, Kerlix and Ace  bandages. The wound  on the top of his right ankle has reopened but this is quite small. There was a little bit of purulent material that I expressed when examining this wound. 08/24/2021: After the aggressive debridement I performed at his last visit, the wounds actually look a little bit better today. They are smaller with the exception of the wound on the top of his right ankle which is a little bit bigger as some more skin pulled off when he was changing his dressing. We are using topical Keystone with PolyMem Ag Kerlix and Ace bandages. 09/02/2021: There has been really no change to any of his wounds. 09/16/2021: The patient was hospitalized last week with nausea, vomiting, and dehydration. He says that while he was in the hospital, his wounds were not really addressed properly. T oday, both plantar foot wounds are larger and the periwound skin is macerated. The wound on the dorsum of his right foot has a scab on the top. The right foot now has a crater where previously he had had nonviable fat. It looks as though this simply died and fell out. The periwound callus is wet. 09/24/2021: His wounds have deteriorated somewhat since his last visit. The wound on the dorsum of his right foot near his ankle is larger and has more nonviable tissue present. The crater in his right foot is even deeper; I cannot quite palpate or probe to bone but I am sure it is close. The wound on his left plantar foot has an odd boggy area in the center that almost feels as though it has fluid within it. He has run out of his topical Keystone antibiotic. We are using silver alginate on his wounds. 09/29/2021: He has developed a new wound on the dorsum of his left foot near his ankle. He says he thinks his wrapping is rubbing in that site. I would concur with this as the wound on his right ankle is larger. The left foot looks about the same. The right foot has the crater that was present last week. No significant slough  accumulation, but his foot remains quite swollen and warm despite oral antibiotic therapy. 10/08/2021: All of his wounds look about the same as last week. He did not start his oral antibiotics that are prescribed until just a couple of days ago; his Jodie Echevaria compounded antibiotics formula has been changed and he is awaiting delivery of the new recipe. His MRI that was scheduled for earlier this week was canceled as no prior authorization had been obtained; unfortunately the tech responsible sent an email to my old [place] email, which I no longer use nor have access to. 10/18/2021: The wounds on his bilateral dorsal feet near the ankles are both improved. They are smaller and have just some eschar and slough buildup. The left plantar wound has a fair amount of undermining, but the surface is clean. There is some periwound callus accumulation. On the right plantar foot, there is nonviable fat leading to a deep tunnel that tracks towards his dorsal medial foot. There is periwound callus and slough accumulation, as well. His right foot and leg remain swollen as compared to the left. 10/25/2021: The wounds on his bilateral dorsal feet and at the ankles have broken down somewhat. They are little bit larger than last week. The left plantar wound continues to undermine laterally but the surface is clean. The right plantar foot wound shows some decreased depth in the tunnel tracking towards his dorsal medial foot. He has not yet had  the Doppler study that I ordered; it sounds like there is some confusion about the scheduling of the procedure. In addition, the MRI was denied and I have taken steps to appeal the denial. 11/24/2021: Since our last visit, Mr. Rask was admitted to the hospital where an MRI suggested osteomyelitis. He was taken the operating room by [place], [name] ([id number]) [id number] Page 17 of 21 podiatry. Bone biopsies were negative for osteomyelitis. They  debrided his wounds and applied myriad matrix. He saw them last week and they removed his staples. He is here today to continue his wound healing process. T oday, both of the dorsal foot/ankle wounds are substantially smaller. There is just a little eschar overlying the left sided wound and some eschar and slough on the right. The right plantar foot ulcer has the healthiest surface of granulation tissue that I have seen to date. A portion of the myriad matrix failed to take and was hanging loose. It appears that myriad morcells were placed into the tunnel closest to the dorsal portion of his foot. These have sloughed off. The left plantar foot ulcer is about the same size, but has a much healthier surface than in the past. Both plantar ulcers have callus and slough accumulation. 12/07/2021: Left dorsal foot/ankle wound is closed. The right dorsal foot/ankle wound is nearly closed and just has a small open area with some eschar and slough. The right plantar foot wound has contracted quite a bit since our last visit. It has a healthy surface with just a little bit of slough accumulation and periwound callus buildup. The left plantar foot wound is about the same size but the surface appears healthy. There is a little slough and periwound callus on this side, as well. 12/15/2021: Both dorsal foot/ankle wounds are closed. The right plantar foot wound is substantially smaller than at our last visit. The tunneling that was present has nearly closed. There is just a little bit of slough buildup. The left plantar foot wound is also a little bit smaller today. The surface is the healthiest that I have ever seen it. Light slough and periwound callus accumulation on this side. 12/22/2021: The dorsal ankle/foot wounds remain closed. The right plantar foot wound continues to contract. There is still a bit of depth at the lateral portion of the wound but the surface has a good granulated appearance. The wound on the left  is about the same size, the central indented portion is still adherent. It also is clean with good granulation tissue present. The periwound skin and callus are a bit macerated, however. 12/29/2021: Both ankle wounds remain closed. The right plantar foot wound is less than half the size that it was last week. There is no depth any longer and the surface has just a little bit of slough and periwound callus. On the left, the wound is not much smaller, but the surface is healthier with good granulation tissue. There is also a little slough and periwound callus buildup. 01/05/2022: The right plantar foot wound continues to contract and is once again about half the size as it was last week. There is just a little bit of surface slough and periwound callus. On the left, the depth of the wound has decreased and the diameter has started to contract. It is also very clean with just a little slough and biofilm present. 01/12/2022: The right plantar foot wound is smaller again today. There is just some periwound callus and slough accumulation. On the left, there is a  fair amount of undermining and the surface is a little boggy, but no other significant change. 01/19/2022: The right plantar wound continues to contract. There is minimal periwound callus accumulation and just a light layer of slough on the surface. On the left, the surface looks better, but there is fairly significant undermining near the 11:00 portion of the wound, aiming towards his great toe. The skin overlying this area is very healthy and has a good fat layer present. No malodor or purulent drainage. 01/26/2022: The right sided wound continues to contract and has just a light layer of slough on the surface. Minimal periwound callus. On the left, he still has substantial undermining and the tissue surface is not as robust as I would like to see. No malodor or purulent drainage, but he did say he had a lot of serous drainage over the  weekend. 02/02/2022: The right foot wound is about a quarter of the size that it was at his last visit. There is just a little slough on the surface and some periwound callus. On the left, the undermining persists and the tissue is still more pale, but he does not seem to have had as much drainage over the last weekend. We are waiting on a wound VAC. 02/09/2022: His right foot has healed. He received the wound VAC, but did not bring it to clinic with him today. The lateral aspect of the left foot wound has come in a little, but he still has substantial undermining on the medial and distal portion of the wound. Still with macerated periwound callus. 02/16/2022: The wound VAC was applied last Friday. T oday, there has been tremendous improvement in the surface of the wound bed. The undermined portion of the wound has come in a bit. There is still some overhanging dead skin. Overall the wound looks much better. 02/23/2022: No significant change in the overall wound dimensions, but the wound surface continues to improve and appears healthier and more robust. There is some periwound callus accumulation and overhanging skin. No concern for infection. 03/02/2022: Although the measurements taken in clinic today were unchanged, the visual appearance of the wound looks like it is smaller and the undermining/tunneling is filling with flesh. There is less periwound tissue maceration than usual. No malodor or purulent drainage. No concern for infection. 03/09/2022: The undermining has come in by couple of millimeters. The periwound is a little bit more macerated this week. No concern for infection. 12/13; substantial wound on the left plantar foot. We are using silver collagen and a wound VAC. 03/23/2022: The undermining continues to contract. The wound surface is a little bit dry. 03/30/2022: The undermining has essentially resolved. There is still some depth at the center of the wound. The wound surface has good  beefy tissue and the moisture balance is better. 04/06/2022: The wound dimensions are about the same, except that the depth is beginning to fill in. He has had more drainage this week, but there is no obvious concern for infection. 04/13/2022: He has built up a little bit of callus around the edges of the wound, creating some undermining. Otherwise the wound looks fairly unchanged. 04/20/2022: The wound bed tissue looks very healthy and robust. Light biofilm on the surface. There is some built up wet callus around the wound edges, but no undermining. 04/27/2022: The wound surface continues to look very healthy. Although the wound dimensions measured the same, it looks smaller to me. No real callus accumulation this week. Minimal slough on the surface.  05/04/2022: The wound measured smaller and shallower this week. We are still awaiting insurance approval for Apligraf. Patient History Information obtained from Patient. Family History No family history of Cancer, Diabetes, Hereditary Spherocytosis, Hypertension, Kidney Disease, Lung Disease, Seizures, Stroke, Thyroid Problems, Tuberculosis. Social History Never smoker, Marital Status - Single, Alcohol Use - Rarely, Drug Use - No History, Caffeine Use - Never. Medical History Eyes Denies history of Cataracts, Glaucoma, Optic Neuritis Ear/Nose/Mouth/Throat [place], [name] ([id number]) [id number] Page 18 of 21 Denies history of Chronic sinus problems/congestion, Middle ear problems Hematologic/Lymphatic Denies history of Anemia, Hemophilia, Human Immunodeficiency Virus, Lymphedema, Sickle Cell Disease Respiratory Denies history of Aspiration, Asthma, Chronic Obstructive Pulmonary Disease (COPD), Pneumothorax, Sleep Apnea, Tuberculosis Cardiovascular Denies history of Angina, Arrhythmia, Congestive Heart Failure, Coronary Artery Disease, Deep Vein Thrombosis, Hypertension, Hypotension, Myocardial Infarction, Peripheral  Arterial Disease, Peripheral Venous Disease, Phlebitis, Vasculitis Gastrointestinal Denies history of Cirrhosis , Colitis, Crohnoos, Hepatitis A, Hepatitis B, Hepatitis C Endocrine Patient has history of Type II Diabetes Denies history of Type I Diabetes Immunological Denies history of Lupus Erythematosus, Raynaudoos, Scleroderma Integumentary (Skin) Denies history of History of Burn Musculoskeletal Denies history of Gout, Rheumatoid Arthritis, Osteoarthritis, Osteomyelitis Neurologic Denies history of Dementia, Neuropathy, Quadriplegia, Paraplegia, Seizure Disorder Oncologic Denies history of Received Chemotherapy, Received Radiation Psychiatric Denies history of Anorexia/bulimia, Confinement Anxiety Hospitalization/Surgery History - 11/1-11/06/2018- sepsis foot infection. - 11/4-11/5 02 sats low respiratory distress. Objective Constitutional no acute distress. Vitals Time Taken: 8:52 AM, Height: 77 in, Weight: 280 lbs, BMI: 33.2, Temperature: 98 F, Pulse: 93 bpm, Respiratory Rate: 18 breaths/min, Blood Pressure: 134/73 mmHg, Capillary Blood Glucose: 87 mg/dl. General Notes: glucose per pt report this am Respiratory Normal work of breathing on room air. General Notes: 05/04/2022: The wound measured smaller and shallower this week. Integumentary (Hair, Skin) Wound #3 status is Open. Original cause of wound was Trauma. The date acquired was: 10/02/2019. The wound has been in treatment 131 weeks. The wound is located on the [place]. The wound measures 3.1cm length x 3.5cm width x 1.6cm depth; 8.522cm^2 area and 13.635cm^3 volume. There is Fat Layer (Subcutaneous Tissue) exposed. There is no tunneling or undermining noted. There is a medium amount of serosanguineous drainage noted. The wound margin is thickened. There is large (67-100%) red granulation within the wound bed. There is a small (1-33%) amount of necrotic tissue within the wound bed including Adherent  Slough. The periwound skin appearance had no abnormalities noted for color. The periwound skin appearance exhibited: Callus, Maceration. Periwound temperature was noted as No Abnormality. 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 There was a Selective/Open Wound Skin/Epidermis Debridement with a total area of 10.85 sq cm performed by [name], MD. With the following instrument(s): Curette to remove Non-Viable tissue/material. Material removed includes Callus, Skin: Epidermis, and Biofilm after achieving pain control using Lidocaine 4% Topical Solution. No specimens were taken. A time out was conducted at 09:10, prior to the start of the procedure. A Minimum amount of bleeding was controlled with Pressure. The procedure was tolerated well with a pain level of 0 throughout and a pain level of 0 following the procedure. Post Debridement Measurements: 3.1cm length x 3.5cm width x 1.6cm depth; 13.635cm^3 volume. Character of Wound/Ulcer Post Debridement  is improved. Severity of Tissue Post Debridement is: Fat layer exposed. [place], [name] ([id number]) [id number] Page 19 of 21 Post procedure Diagnosis Wound #3: Same as Pre-Procedure General Notes: Scribed for Dr. Lady Gary by Zenaida Deed, RN. Plan Follow-up Appointments: Return Appointment in 1 week. - Dr. Lady Gary - Room 1 with [facility] Wednesday 2/7 @ 0815 am Bathing/ Shower/ Hygiene: May shower and wash wound with soap and water. Negative Presssure Wound Therapy: Wound #3 Left,Lateral,Plantar Foot: Wound Vac to wound continuously at 178mm/hg pressure Black Foam Edema Control - Lymphedema / SCD  / Other: Avoid standing for long periods of time. Exercise regularly Compression stocking or Garment 20-30 mm/Hg pressure to: - to both legs daily Off-Loading: Open toe surgical shoe to: - Both feet Additional Orders / Instructions: Follow Nutritious Diet Non Wound Condition: Apply the following to affected area as directed: - continue to apply foam donut or cushion to healed right plantar foot WOUND #3: - Foot Wound Laterality: Plantar, Left, Lateral Cleanser: Soap and Water 3 x Per Week/30 Days Discharge Instructions: May shower and wash wound with dial antibacterial soap and water prior to dressing change. Peri-Wound Care: Skin Prep (Generic) 3 x Per Week/30 Days Discharge Instructions: Use skin prep as directed Prim Dressing: Promogran Prisma Matrix, 4.34 (sq in) (silver collagen) (Generic) 3 x Per Week/30 Days ary Discharge Instructions: Moisten collagen with saline or hydrogel Prim Dressing: VAC 3 x Per Week/30 Days ary Secondary Dressing: Zetuvit Plus 4x8 in (Generic) 3 x Per Week/30 Days Discharge Instructions: Apply over primary dressing as directed. Secured With: Elastic Bandage 4 inch (ACE bandage) (Generic) 3 x Per Week/30 Days Discharge Instructions: Secure with ACE bandage as directed. Secured With: 22M Medipore Scientist, research (life sciences) Surgical T 2x10 (in/yd) (Generic) 3 x Per Week/30 Days ape Discharge Instructions: Secure with tape as directed. Com pression Wrap: Kerlix Roll 4.5x3.1 (in/yd) (Generic) 3 x Per Week/30 Days Discharge Instructions: Apply Kerlix and Coban compression as directed. 05/04/2022: The wound measured smaller and shallower this week. I used a curette to debride callus and biofilm from his wound. I am pleased with the progress he is making with the wound VAC. I think we will move along even faster if we can get approval for Apligraf. Continue negative pressure wound therapy. Follow-up in 1 week. Electronic Signature(s) Signed: 05/04/2022 9:24:09 AM By: Duanne Guess MD FACS Entered By: Duanne Guess on 05/04/2022 09:24:08 -------------------------------------------------------------------------------- HxROS Details Patient Name: Date of Service: [name], [name] 05/04/2022 8:45 A M Medical Record Number: [id number] Patient Account Number: [id number] Date of Birth/Sex: Treating RN: [birth year] (36 y.o. M) Primary Care Provider: [name] Other Clinician: Referring Provider: Treating Provider/Extender: [name] in Treatment: 131 Information Obtained From Patient Eyes Medical History: Negative for: Cataracts; Glaucoma; Optic Neuritis [place], [name] ([id number]) [id number] Page 20 of 21 Ear/Nose/Mouth/Throat Medical History: Negative for: Chronic sinus problems/congestion; Middle ear problems Hematologic/Lymphatic Medical History: Negative for: Anemia; Hemophilia; Human Immunodeficiency Virus; Lymphedema; Sickle Cell Disease Respiratory Medical History: Negative for: Aspiration; Asthma; Chronic Obstructive Pulmonary Disease (COPD); Pneumothorax; Sleep Apnea; Tuberculosis Cardiovascular Medical History: Negative for: Angina; Arrhythmia; Congestive Heart Failure; Coronary Artery Disease; Deep Vein Thrombosis; Hypertension; Hypotension; Myocardial Infarction; Peripheral Arterial Disease; Peripheral Venous Disease; Phlebitis; Vasculitis Gastrointestinal Medical History: Negative for: Cirrhosis ; Colitis; Crohns; Hepatitis A; Hepatitis B; Hepatitis C Endocrine Medical History: Positive for: Type II Diabetes Negative for: Type I Diabetes Time with diabetes: 8 Treated with: Insulin Blood sugar tested every day: No Immunological Medical History: Negative for: Lupus  Erythematosus; Raynauds; Scleroderma Integumentary (Skin) Medical History: Negative for: History of Burn Musculoskeletal Medical History: Negative for: Gout; Rheumatoid Arthritis; Osteoarthritis;  Osteomyelitis Neurologic Medical History: Negative for: Dementia; Neuropathy; Quadriplegia; Paraplegia; Seizure Disorder Oncologic Medical History: Negative for: Received Chemotherapy; Received Radiation Psychiatric Medical History: Negative for: Anorexia/bulimia; Confinement Anxiety Immunizations Pneumococcal Vaccine: Received Pneumococcal Vaccination: No Implantable Devices None Hospitalization / Surgery History Type of Hospitalization/Surgery 11/1-11/06/2018- sepsis foot infection 11/4-11/5 02 sats low respiratory distress [place], [name] ([id number]) [id number] Page 21 of 31 Family and Social History Cancer: No; Diabetes: No; Hereditary Spherocytosis: No; Hypertension: No; Kidney Disease: No; Lung Disease: No; Seizures: No; Stroke: No; Thyroid Problems: No; Tuberculosis: No; Never smoker; Marital Status - Single; Alcohol Use: Rarely; Drug Use: No History; Caffeine Use: Never; Financial Concerns: No; Food, Clothing or Shelter Needs: No; Support System Lacking: No; Transportation Concerns: No Electronic Signature(s) Signed: 05/04/2022 9:31:25 AM By: Duanne Guess MD FACS Entered By: Duanne Guess on 05/04/2022 09:22:47 -------------------------------------------------------------------------------- SuperBill Details Patient Name: Date of Service: [name], [name] 05/04/2022 Medical Record Number: [id number] Patient Account Number: [id number] Date of Birth/Sex: Treating RN: 16-Sep-1985 (36 y.o. M) Primary Care Provider: [name] Other Clinician: Referring Provider: Treating Provider/Extender: [name] in Treatment: 131 Diagnosis Coding ICD-10 Codes Code Description [phone] Non-pressure chronic ulcer of other part of left foot with other specified severity E11.621 Type 2 diabetes mellitus with foot ulcer Facility Procedures : CPT4 Code: [id number] Description: 7597 - DEBRIDE WOUND 1ST 20 SQ CM OR < ICD-10  Diagnosis Description L97.528 Non-pressure chronic ulcer of other part of left foot with other specified severi Modifier: ty Quantity: 1 : CPT4 Code: [id number] 9 Description: 7605 - WOUND VAC-50 SQ CM OR LESS Modifier: Quantity: 1 Physician Procedures : CPT4 Code Description Modifier [id number] - WC PHYS LEVEL 4 - EST PT 25 ICD-10 Diagnosis Description L97.528 Non-pressure chronic ulcer of other part of left foot with other specified severity E11.621 Type 2 diabetes mellitus with foot ulcer Quantity: 1 : [id number] - WC PHYS DEBR WO ANESTH 20 SQ CM ICD-10 Diagnosis Description L97.528 Non-pressure chronic ulcer of other part of left foot with other specified severity Quantity: 1 Electronic Signature(s) Signed: 05/04/2022 9:24:25 AM By: Duanne Guess MD FACS Entered By: Duanne Guess on 05/04/2022 53:66:44

## 2022-05-11 ENCOUNTER — Encounter (HOSPITAL_BASED_OUTPATIENT_CLINIC_OR_DEPARTMENT_OTHER): Payer: BC Managed Care – PPO | Attending: General Surgery | Admitting: General Surgery

## 2022-05-11 DIAGNOSIS — L97528 Non-pressure chronic ulcer of other part of left foot with other specified severity: Secondary | ICD-10-CM | POA: Insufficient documentation

## 2022-05-11 DIAGNOSIS — E1151 Type 2 diabetes mellitus with diabetic peripheral angiopathy without gangrene: Secondary | ICD-10-CM | POA: Insufficient documentation

## 2022-05-11 DIAGNOSIS — E11621 Type 2 diabetes mellitus with foot ulcer: Secondary | ICD-10-CM | POA: Insufficient documentation

## 2022-05-11 DIAGNOSIS — I10 Essential (primary) hypertension: Secondary | ICD-10-CM | POA: Insufficient documentation

## 2022-05-11 DIAGNOSIS — I890 Lymphedema, not elsewhere classified: Secondary | ICD-10-CM | POA: Insufficient documentation

## 2022-05-11 DIAGNOSIS — L84 Corns and callosities: Secondary | ICD-10-CM | POA: Insufficient documentation

## 2022-05-11 DIAGNOSIS — M659 Synovitis and tenosynovitis, unspecified: Secondary | ICD-10-CM | POA: Diagnosis not present

## 2022-05-12 NOTE — Progress Notes (Signed)
Westermeyer, Max ([id number]) [id number] Page 1 of 7 Visit Report for 05/11/2022 Arrival Information Details Patient Name: Date of Service: [name] 05/11/2022 8:15 A M Medical Record Number: [id number] Patient Account Number: [id number] Date of Birth/Sex: Treating RN: [birth year] (37 y.o. [name] Primary Care Cindel Daugherty: [name] Other Clinician: Referring Thorin Starner: Treating Natilie Krabbenhoft/Extender: [name] in Treatment: 55 Visit Information History Since Last Visit Added or deleted any medications: No Patient Arrived: Ambulatory Any new allergies or adverse reactions: No Arrival Time: 08:16 Had a fall or experienced change in No Accompanied By: fiance activities of daily living that may affect Transfer Assistance: None risk of falls: Patient Identification Verified: Yes Signs or symptoms of abuse/neglect since No Secondary Verification Process Completed: Yes last visito Patient Requires Transmission-Based Precautions: No Hospitalized since last visit: No Patient Has Alerts: No Implantable device outside of the clinic No excluding cellular tissue based products placed in the center since last visit: Has Dressing in Place as Prescribed: Yes Has Compression in Place as Prescribed: Yes Has Footwear/Offloading in Place as Yes Prescribed: Left: Surgical Shoe with Pressure Relief Insole Pain Present Now: No Electronic Signature(s) Signed: 05/11/2022 5:56:04 PM By: Baruch Gouty RN, BSN Entered By: Baruch Gouty on 05/11/2022 08:17:27 -------------------------------------------------------------------------------- Lower Extremity Assessment Details Patient Name: Date of Service: [name], [name] 05/11/2022 8:15 A M Medical Record Number: [id number] Patient Account Number: [id number] Date of Birth/Sex: Treating RN: [birth year] (37 y.o. [name] Primary Care Ronya Gilcrest: [name] Other  Clinician: Referring Makiah Clauson: Treating Shaelin Lalley/Extender: [name] in Treatment: 132 Edema Assessment Assessed: [Left: No] [Right: No] Edema: [Left: Ye] [Right: s] Calf Left: Right: Point of Measurement: 48 cm From Medial Instep 47.5 cm Ankle Left: Right: Point of Measurement: 11 cm From Medial Instep 30 cm Bohlken, Max ([id number]) [id number] Page 2 of 7 Vascular Assessment Pulses: Dorsalis Pedis Palpable: [Left:Yes] Electronic Signature(s) Signed: 05/11/2022 5:56:04 PM By: Baruch Gouty RN, BSN Entered By: Baruch Gouty on 05/11/2022 08:27:21 -------------------------------------------------------------------------------- Multi Wound Chart Details Patient Name: Date of Service: [name], [name] 05/11/2022 8:15 A M Medical Record Number: [id number] Patient Account Number: [id number] Date of Birth/Sex: Treating RN: 1985/10/01 (37 y.o. M) Primary Care Cedric Denison: [name] Other Clinician: Referring Dhruvan Gullion: Treating Roshonda Sperl/Extender: [name] in Treatment: 132 Vital Signs Height(in): 77 Capillary Blood Glucose(mg/dl): 93 Weight(lbs): 280 Pulse(bpm): 94 Body Mass Index(BMI): 33.2 Blood Pressure(mmHg): 145/90 Temperature(F): 98.1 Respiratory Rate(breaths/min): 18 [3:Photos:] [N/A:N/A] Left, Lateral, Plantar Foot N/A N/A Wound Location: Trauma N/A N/A Wounding Event: Diabetic Wound/Ulcer of the Lower N/A N/A Primary Etiology: Extremity Type II Diabetes N/A N/A Comorbid History: 10/02/2019 N/A N/A Date Acquired: 132 N/A N/A Weeks of Treatment: Open N/A N/A Wound Status: No N/A N/A Wound Recurrence: 2.9x3.4x1.6 N/A N/A Measurements L x W x [name] 7.744 N/A N/A A (cm) : rea 12.39 N/A N/A Volume (cm) : -369.60% N/A N/A % Reduction in A rea: -7409.10% N/A N/A % Reduction in Volume: 11 Starting Position 1 (o'clock): 8 Ending Position 1  (o'clock): 0.8 Maximum Distance 1 (cm): Yes N/A N/A Undermining: Grade 2 N/A N/A Classification: Medium N/A N/A Exudate A mount: Serosanguineous N/A N/A Exudate Type: red, brown N/A N/A Exudate Color: Yes N/A N/A Foul Odor A Cleansing: fter No N/A N/A Odor A nticipated Due to Product Use: Thickened N/A N/A Wound Margin: Large (67-100%) N/A N/A Granulation A mount: Red N/A N/A Granulation Quality: Small (1-33%) N/A N/A Necrotic A mount:  Fat Layer (Subcutaneous Tissue): Yes N/A N/A Exposed Structures: Fascia: No Tendon: No Fales, Max ([id number]) [id number] Page 3 of 7 Muscle: No Joint: No Bone: No Small (1-33%) N/A N/A Epithelialization: Debridement - Selective/Open Wound N/A N/A Debridement: Pre-procedure Verification/Time Out 08:45 N/A N/A Taken: Callus N/A N/A Tissue Debrided: Skin/Epidermis N/A N/A Level: 9.86 N/A N/A Debridement A (sq cm): rea Curette N/A N/A Instrument: Minimum N/A N/A Bleeding: Pressure N/A N/A Hemostasis A chieved: 0 N/A N/A Procedural Pain: 0 N/A N/A Post Procedural Pain: Procedure was tolerated well N/A N/A Debridement Treatment Response: 2.9x3.4x1.6 N/A N/A Post Debridement Measurements L x W x [name] 12.39 N/A N/A Post Debridement Volume: (cm) Callus: Yes N/A N/A Periwound Skin Texture: Maceration: Yes N/A N/A Periwound Skin Moisture: No Abnormalities Noted N/A N/A Periwound Skin Color: No Abnormality N/A N/A Temperature: Cellular or Tissue Based Product N/A N/A Procedures Performed: Debridement Negative Pressure Wound Therapy Maintenance (NPWT) Treatment Notes Electronic Signature(s) Signed: 05/11/2022 9:19:52 AM By: [name] MD FACS Entered By: [name] on 05/11/2022 09:19:52 -------------------------------------------------------------------------------- Multi-Disciplinary Care Plan Details Patient Name: Date of Service: [name], [name] 05/11/2022 8:15 A  M Medical Record Number: [id number] Patient Account Number: [id number] Date of Birth/Sex: Treating RN: 1986/01/15 (37 y.o. [name] Primary Care Alee Gressman: [name] Other Clinician: Referring Jovante Hammitt: Treating Liem Copenhaver/Extender: [name] in Treatment: [place] reviewed with physician Active Inactive Nutrition Nursing Diagnoses: Imbalanced nutrition Potential for alteratiion in Nutrition/Potential for imbalanced nutrition Goals: Patient/caregiver agrees to and verbalizes understanding of need to use nutritional supplements and/or vitamins as prescribed Date Initiated: 10/24/2019 Date Inactivated: 04/06/2020 Target Resolution Date: 04/03/2020 Goal Status: Met Patient/caregiver will maintain therapeutic glucose control Date Initiated: 10/24/2019 Target Resolution Date: 06/01/2022 Goal Status: Active Interventions: Assess HgA1c results as ordered upon admission and as needed Assess patient nutrition upon admission and as needed per policy Provide education on elevated blood sugars and impact on wound healing Provide education on nutrition Treatment Activities: Giovanelli, Max ([id number]) [id number] Page 4 of 7 Education provided on Nutrition : 12/07/2021 Notes: 11/17/20: Glucose control ongoing issue, target date extended. 01/26/21: Glucose management continues. Wound/Skin Impairment Nursing Diagnoses: Impaired tissue integrity Knowledge deficit related to ulceration/compromised skin integrity Goals: Patient/caregiver will verbalize understanding of skin care regimen Date Initiated: 10/24/2019 Target Resolution Date: 06/01/2022 Goal Status: Active Ulcer/skin breakdown will have a volume reduction of 30% by week 4 Date Initiated: 10/24/2019 Date Inactivated: 01/16/2020 Target Resolution Date: 01/10/2020 Unmet Reason: no change in Goal Status: Unmet measurements. Interventions: Assess  patient/caregiver ability to obtain necessary supplies Assess patient/caregiver ability to perform ulcer/skin care regimen upon admission and as needed Assess ulceration(s) every visit Provide education on ulcer and skin care Notes: 11/17/20: Wound care regimen continues Electronic Signature(s) Signed: 05/11/2022 5:56:04 PM By: Baruch Gouty RN, BSN Entered By: Baruch Gouty on 05/11/2022 08:35:46 -------------------------------------------------------------------------------- Negative Pressure Wound Therapy Maintenance (NPWT) Details Patient Name: Date of Service: [name] 05/11/2022 8:15 A M Medical Record Number: [id number] Patient Account Number: [id number] Date of Birth/Sex: Treating RN: 07/07/1985 (37 y.o. [name] Primary Care Terran Hollenkamp: [name] Other Clinician: Referring Dewayne Jurek: Treating Lurlene Ronda/Extender: [name] in Treatment: 132 NPWT Maintenance Performed for: Wound #3 Left, Lateral, Plantar Foot Performed By: Baruch Gouty, RN Type: VAC System Coverage Size (sq cm): 9.86 Pressure Type: Constant Pressure Setting: 100 mmHG Drain Type: None Primary Contact: Other : apligraf Sponge/Dressing Type: Foam, Black Date Initiated: 02/11/2022 Dressing Removed: No Quantity of  Sponges/Gauze Removed: 2 Canister Changed: No Dressing Reapplied: No Quantity of Sponges/Gauze Inserted: 2 Respones T Treatment: o good Days On NPWT : 90 Post Procedure Diagnosis Same as Pre-procedure Electronic Signature(s) Signed: 05/11/2022 5:56:04 PM By: Baruch Gouty RN, BSN Entered By: Baruch Gouty on 05/11/2022 08:52:54 Nesser, Max ([id number]) [id number] Page 5 of 7 -------------------------------------------------------------------------------- Pain Assessment Details Patient Name: Date of Service: [name] 05/11/2022 8:15 A M Medical Record Number: [id number] Patient Account Number:  [id number] Date of Birth/Sex: Treating RN: [birth year] (37 y.o. [name] Primary Care Parnell Spieler: [name] Other Clinician: Referring Sundai Probert: Treating Mariame Rybolt/Extender: [name] in Treatment: 132 Active Problems Location of Pain Severity and Description of Pain Patient Has Paino No Site Locations Rate the pain. Current Pain Level: 0 Pain Management and Medication Current Pain Management: Electronic Signature(s) Signed: 05/11/2022 5:56:04 PM By: Baruch Gouty RN, BSN Entered By: Baruch Gouty on 05/11/2022 08:21:56 -------------------------------------------------------------------------------- Patient/Caregiver Education Details Patient Name: Date of Service: [name], [name] 2/7/2024andnbsp8:15 A M Medical Record Number: [id number] Patient Account Number: [id number] Date of Birth/Gender: Treating RN: February 20, 1986 (37 y.o. [name] Primary Care Physician: [name] Other Clinician: Referring Physician: Treating Physician/Extender: [name] in Treatment: 132 Education Assessment Education Provided To: Patient Education Topics Provided Elevated Blood Sugar/ Impact on Healing: Methods: Explain/Verbal Responses: Reinforcements needed, State content correctly [place], Max ([id number]) [id number] Page 6 of 7 Offloading: Methods: Explain/Verbal Responses: Reinforcements needed, State content correctly Wound/Skin Impairment: Methods: Explain/Verbal Responses: Reinforcements needed, State content correctly Electronic Signature(s) Signed: 05/11/2022 5:56:04 PM By: Baruch Gouty RN, BSN Entered By: Baruch Gouty on 05/11/2022 08:36:35 -------------------------------------------------------------------------------- Wound Assessment Details Patient Name: Date of Service: [name], [name] 05/11/2022 8:15 A M Medical Record Number: [id number] Patient Account  Number: [id number] Date of Birth/Sex: Treating RN: 03-05-1986 (37 y.o. [name] Primary Care Cledith Abdou: [name] Other Clinician: Referring Keni Wafer: Treating Amaro Mangold/Extender: [name] in Treatment: 132 Wound Status Wound Number: 3 Primary Etiology: Diabetic Wound/Ulcer of the Lower Extremity Wound Location: Left, Lateral, Plantar Foot Wound Status: Open Wounding Event: Trauma Comorbid History: Type II Diabetes Date Acquired: 10/02/2019 Weeks Of Treatment: 132 Clustered Wound: No Photos Wound Measurements Length: (cm) 2.9 Width: (cm) 3.4 Depth: (cm) 1.6 Area: (cm) 7.744 Volume: (cm) 12.39 % Reduction in Area: -369.6% % Reduction in Volume: -7409.1% Epithelialization: Small (1-33%) Undermining: Yes Starting Position (o'clock): 11 Ending Position (o'clock): 8 Maximum Distance: (cm) 0.8 Wound Description Classification: Grade 2 Wound Margin: Thickened Exudate Amount: Medium Exudate Type: Serosanguineous Exudate Color: red, brown Foul Odor After Cleansing: Yes Due to Product Use: No Slough/Fibrino No Wound Bed Granulation Amount: Large (67-100%) Exposed Structure Granulation Quality: Red Fascia Exposed: No Necrotic Amount: Small (1-33%) Fat Layer (Subcutaneous Tissue) Exposed: Jamelle Goldston, Max ([id number]) [id number] Page 7 of 7 Necrotic Quality: Adherent Slough Tendon Exposed: No Muscle Exposed: No Joint Exposed: No Bone Exposed: No Periwound Skin Texture Texture Color No Abnormalities Noted: No No Abnormalities Noted: Yes Callus: Yes Temperature / Pain Temperature: No Abnormality Moisture No Abnormalities Noted: No Maceration: Yes Electronic Signature(s) Signed: 05/11/2022 5:56:04 PM By: Baruch Gouty RN, BSN Entered By: Baruch Gouty on 05/11/2022 08:34:41 -------------------------------------------------------------------------------- [place] Details Patient Name: Date of  Service: [name], [name] 05/11/2022 8:15 A M Medical Record Number: [id number] Patient Account Number: [id number] Date of Birth/Sex: Treating RN: Nov 19, 1985 (37 y.o. [name] Primary Care Jahni Nazar: [name] Other Clinician: Referring Adriane Guglielmo: Treating Truth Wolaver/Extender: [name]  [name], Ronal Fear in Treatment: 132 Vital Signs Time Taken: 08:21 Temperature (F): 98.1 Height (in): 77 Pulse (bpm): 94 Weight (lbs): 280 Respiratory Rate (breaths/min): 18 Body Mass Index (BMI): 33.2 Blood Pressure (mmHg): 145/90 Capillary Blood Glucose (mg/dl): 93 Reference Range: 80 - 120 mg / dl Notes glucose per pt report this am Electronic Signature(s) Signed: 05/11/2022 5:56:04 PM By: Zenaida Deed RN, BSN Entered By: Zenaida Deed on 05/11/2022 08:21:48

## 2022-05-12 NOTE — Progress Notes (Signed)
[place], [name] ([id number]) [id number] Page 1 of 22 Visit Report for 05/11/2022 Chief Complaint Document Details Patient Name: Date of Service: [name] 05/11/2022 8:15 A M Medical Record Number: [id number] Patient Account Number: [id number] Date of Birth/Sex: Treating RN: 06/16/1985 (37 y.o. M) Primary Care Provider: [name] Other Clinician: Referring Provider: Treating Provider/Extender: [name] in Treatment: [place] from: Patient Chief Complaint 01/11/2019; patient is here for review of a rather substantial wound over the left fifth plantar metatarsal head extending into the lateral part of his foot 10/24/2019; patient returns to clinic with wounds on his bilateral feet with underlying osteomyelitis biopsy-proven Electronic Signature(s) Signed: 05/11/2022 9:20:02 AM By: [name] MD FACS Entered By: [name] on 05/11/2022 09:20:02 -------------------------------------------------------------------------------- Cellular or Tissue Based Product Details Patient Name: Date of Service: [name], [name] 05/11/2022 8:15 A M Medical Record Number: [id number] Patient Account Number: [id number] Date of Birth/Sex: Treating RN: July 25, 1985 (37 y.o. [name] Primary Care Provider: [name] Other Clinician: Referring Provider: Treating Provider/Extender: [name] in Treatment: 132 Cellular or Tissue Based Product Type Wound #3 Left,Lateral,Plantar Foot Applied to: Performed By: Physician [name], MD Cellular or Tissue Based Product Type: Apligraf Level of Consciousness (Pre-procedure): Awake and Alert Pre-procedure Verification/Time Out Yes - 08:50 Taken: Location: genitalia / hands / feet / multiple digits Wound Size (sq cm): 9.86 Product Size (sq cm): 44 Waste Size (sq cm): 4 Waste Reason: wound size Amount of Product Applied (sq cm):  40 Instrument Used: Blade, Forceps, Scissors Lot #: [id number] Order #: 1 Expiration Date: 05/13/2022 Fenestrated: Yes Instrument: Blade Reconstituted: Yes Solution Type: saline Solution Amount: 10 ml Lot #: [id number] Solution Expiration Date: 09/01/2024 Secured: Yes Secured With: Steri-Strips Dressing Applied: Yes Primary Dressing: adaptic, Harlin Heys Procedural Pain: 0 [place], [name] ([id number]) [id number] Page 2 of 22 Post Procedural Pain: 0 Response to Treatment: Procedure was tolerated well Level of Consciousness (Post- Awake and Alert procedure): Post Procedure Diagnosis Same as Pre-procedure Notes Scribed for Dr. [name] by Baruch Gouty, RN Electronic Signature(s) Signed: 05/11/2022 9:51:55 AM By: [name] MD FACS Signed: 05/11/2022 5:56:04 PM By: Baruch Gouty RN, BSN Entered By: Baruch Gouty on 05/11/2022 09:14:13 -------------------------------------------------------------------------------- Debridement Details Patient Name: Date of Service: [name], [name] 05/11/2022 8:15 A M Medical Record Number: [id number] Patient Account Number: [id number] Date of Birth/Sex: Treating RN: [birth year] (37 y.o. [name] Primary Care Provider: [name] Other Clinician: Referring Provider: Treating Provider/Extender: [name] in Treatment: 132 Debridement Performed for Assessment: Wound #3 Left,Lateral,Plantar Foot Performed By: Physician [name], MD Debridement Type: Debridement Severity of Tissue Pre Debridement: Fat layer exposed Level of Consciousness (Pre-procedure): Awake and Alert Pre-procedure Verification/Time Out Yes - 08:45 Taken: Start Time: 08:45 T Area Debrided (L x W): otal 2.9 (cm) x 3.4 (cm) = 9.86 (cm) Tissue and other material debrided: Non-Viable, Callus, Skin: Epidermis Level: Skin/Epidermis Debridement Description: Selective/Open Wound Instrument:  Curette Bleeding: Minimum Hemostasis Achieved: Pressure Procedural Pain: 0 Post Procedural Pain: 0 Response to Treatment: Procedure was tolerated well Level of Consciousness (Post- Awake and Alert procedure): Post Debridement Measurements of Total Wound Length: (cm) 2.9 Width: (cm) 3.4 Depth: (cm) 1.6 Volume: (cm) 12.39 Character of Wound/Ulcer Post Debridement: Improved Severity of Tissue Post Debridement: Fat layer exposed Post Procedure Diagnosis Same as Pre-procedure Notes Scribed for Dr. [name] by Baruch Gouty, RN Electronic Signature(s) Signed: 05/11/2022 9:51:55 AM By: [name] MD FACS Signed: 05/11/2022  5:56:04 PM By: Baruch Gouty RN, BSN Entered By: Baruch Gouty on 05/11/2022 08:51:21 [place], [name] ([id number]) [id number] Page 3 of 22 -------------------------------------------------------------------------------- HPI Details Patient Name: Date of Service: [name] 05/11/2022 8:15 A M Medical Record Number: [id number] Patient Account Number: [id number] Date of Birth/Sex: Treating RN: Apr 10, 1985 (37 y.o. 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 He comes in for review of a wound over the plantar fifth metatarsal head extending into the lateral part of the foot. He was followed for this previously by his podiatrist Dr. Cornelius Moras. As the patient tells his story he went to see podiatry first for a swelling he developed on the lateral part of his fifth metatarsal head in May. He states this was "open" by podiatry and the area closed. He was followed up in June and it was again opened callus removed and it closed promptly. There were plans being made for surgery  on the fifth metatarsal head in June however his blood sugar was apparently too high for anesthesia. Apparently the area was debrided and opened again in June and it is never closed since. Looking over the records from podiatry I am really not able to follow this. It was clear when he was first seen it was before 5/14 at that point he already had a wound. By 5/17 the ulcer was resolved. I do not see anything about a procedure. On 5/28 noted to have pre-ulcerative moderate keratosis. X-ray noted 1/5 contracted toe and tailor's bunion and metatarsal deformity. On a visit date on 09/28/2018 the dorsal part of the left foot it healed and resolved. There was concern about swelling in his lower extremity he was sent to the ER.. As far as I can tell he was seen in the ER on 7/12 with an ulcer on his left foot. A DVT rule out of the left leg was negative. I do not think I have complete records from podiatry but I am not able to verify the procedures this patient states he had. He states after the last procedure the wound has never closed although I am not able to follow this in the records I have from podiatry. He has not had a recent x-ray The patient has been using Neosporin on the wound. He is wearing a Darco shoe. He is still very active up on his foot working and exercising. Past medical history; type 2 diabetes ketosis-prone, leg swelling with a negative DVT study in July. Non-smoker ABI in our clinic was 0.85 on the left 10/16; substantial wound on the plantar left fifth met head extending laterally almost to the dorsal fifth MTP. We have been using silver alginate we gave him a Darco forefoot off loader. An x-ray did not show evidence of osteomyelitis did note soft tissue emphysema which I think was due to gas tracking through an open wound. There is no doubt in my mind he requires an MRI 10/23; MRI not booked until 3 November at the earliest this is largely due to his glucose sensor in the right arm.  We have been using silver alginate. There has been an improvement 10/29; I am still not exactly sure when his MRI is booked for. He says it is the third but it is the 10th in epic. This definitely needs to  be done. He is running a low-grade fever today but no other symptoms. No real improvement in the 1 02/26/2019 patient presents today for a follow-up visit here in our clinic he is last been seen in the clinic on October 29. Subsequently we were working on getting MRI to evaluate and see what exactly was going on and where we would need to go from the standpoint of whether or not he had osteomyelitis and again what treatments were going be required. Subsequently the patient ended up being admitted to the hospital on 02/07/2019 and was discharged on 02/14/2019. This is a somewhat interesting admission with a discharge diagnosis of pneumonia due to COVID-19 although he was positive for COVID-19 when tested at the urgent care but negative x2 when he was actually in the hospital. With that being said he did have acute respiratory failure with hypoxia and it was noted he also have a left foot ulceration with osteomyelitis. With that being said he did require oxygen for his pneumonia and I level 4 L. He was placed on antivirals and steroids for the COVID-19. He was also transferred to the [facility] at one point. Nonetheless he did subsequently discharged home and since being home has done much better in that regard. The CT angiogram did not show any pulmonary embolism. With regard to the osteomyelitis the patient was placed on vancomycin and Zosyn while in the hospital but has been changed to Augmentin at discharge. It was also recommended that he follow- up with wound care and podiatry. Podiatry however wanted him to see [name] according to the patient prior to them doing anything further. His hemoglobin A1c was 9.9 as noted in the hospital. Have an MRI of the left foot performed while in the hospital  on 02/04/2019. This showed evidence of septic arthritis at the fifth MTP joint and osteomyelitis involving the fifth metatarsal head and proximal phalanx. There is an overlying plantar open wound noted an abscess tracking back along the lateral aspect of the fifth metatarsal shaft. There is otherwise diffuse cellulitis and mild fasciitis without findings of polymyositis. The patient did have recently pneumonia secondary to COVID-19 I looked in the chart through epic and it does appear that the patient may need to have an additional x-ray just to ensure everything is cleared and that he has no airspace disease prior to putting him into the [name]. 03/05/2019; patient was readmitted to the clinic last week. He was hospitalized twice for a viral upper respiratory tract infection from 11/1 through 11/4 and then 11/5 through 11/12 ultimately this turned out to be Covid pneumonitis. Although he was discharged on oxygen he is not using it. He says he feels fine. He has no exercise limitation no cough no sputum. His O2 sat in our clinic today was 100% on room air. He did manage to have his MRI which showed septic arthritis at the fifth MTP joint and osteomyelitis involving the fifth metatarsal head and proximal phalanx. He received Vanco and Zosyn in the hospital and then was discharged on 2 weeks of Augmentin. I do not see any relevant cultures. He was supposed to follow-up with infectious disease but I do not see that he has an appointment. 12/8; patient saw Dr. Novella Olive of infectious disease last week. He felt that he had had adequate antibiotic therapy. He did not go to follow-up with Dr. Amalia Hailey of podiatry and I have again talked to him about the pros and cons of this. He does not want  to consider a ray amputation of this time. He is aware of the risks of recurrence, migration etc. He started HBO today and tolerated this well. He can complete the Augmentin that I gave him last week. I have looked over the lab  work that Dr. Chana Bode ordered his C-reactive protein was 3.3 and his sedimentation rate was 17. The C-reactive protein is never really been measurably that high in this patient 12/15; not much change in the wound today however he has undermining along the lateral part of the foot again more extensively than last week. He has some rims of epithelialization. We have been using silver alginate. He is undergoing hyperbarics but did not dive today 12/18; in for his obligatory first total contact cast change. Unfortunately there was pus coming from the undermining area around his fifth metatarsal head. This was cultured but will preclude reapplication of a cast. He is seen in conjunction with HBO 12/24; patient had staph lugdunensis in the wound in the undermining area laterally last time. We put him on doxycycline which should have covered this. The wound looks better today. I am going to give him another week of doxycycline before reattempting the total contact cast 12/31; the patient is completing antibiotics. Hemorrhagic debris in the distal part of the wound with some undermining distally. He also had hyper granulation. Extensive debridement with a #5 curette. The infected area that was on the lateral part of the fifth met head is closed over. I do not think he needs any more antibiotics. Patient was seen prior to HBO. Preparations for a total contact cast were made in the cast will be placed post hyperbarics 04/11/19; once again the patient arrives today without complaint. He had been in a cast all week noted that he had heavy drainage this week. This resulted in large raised areas of macerated tissue around the wound 1/14; wound bed looks better slightly smaller. Hydrofera Blue has been changing himself. He had a heavy drainage last week which caused a lot of maceration [place], [name] ([id number]) [phone].pdf Page 4 of 22 around the wound so I took him out of a total contact  cast he says the drainage is actually better this week He is seen today in conjunction with HBO 1/21; returns to clinic. He was up in [place] for a day or 2 attending a funeral. He comes back in with the wound larger and with a large area of exposed bone. He had osteomyelitis and septic arthritis of the fifth left metatarsal head while he was in hospital. He received IV antibiotics in the hospital for a prolonged period of time then 3 weeks of Augmentin. Subsequently I gave him 2 weeks of doxycycline for more superficial wound infection. When I saw this last week the wound was smaller the surface of the wound looks satisfactory. 1/28; patient missed hyperbarics today. Bone biopsy I did last time showed Enterococcus faecalis and Staphylococcus lugdunensis . He has a wide area of exposed bone. We are going to use silver alginate as of today. I had another ethical discussion with the patient. This would be recurrent osteomyelitis he is already received IV antibiotics. In this situation I think the likelihood of healing this is low. Therefore I have recommended a ray amputation and with the patient's agreement I have referred him to Dr. Doran Durand. The other issue is that his compliance with hyperbarics has been minimal because of his work schedule and given his underlying decision I am going to stop this today READMISSION  10/24/2019 MRI 09/29/2019 left foot IMPRESSION: 1. Apparent skin ulceration inferior and lateral to the 5th metatarsal base with underlying heterogeneous T2 signal and enhancement in the subcutaneous fat. Small peripherally enhancing fluid collections along the plantar and lateral aspects of the 5th metatarsal base suspicious for abscesses. 2. Interval amputation through the mid 5th metatarsal with nonspecific low-level marrow edema and enhancement. Given the proximity to the adjacent soft tissue inflammatory changes, osteomyelitis cannot be excluded. 3. The additional bones appear  unremarkable. MRI 09/29/2019 right foot IMPRESSION: 1. Soft tissue ulceration lateral to the 5th MTP joint. There is low-level T2 hyperintensity within the 4th and 5th metatarsal heads and adjacent proximal phalanges without abnormal T1 signal or cortical destruction. These findings are nonspecific and could be seen with early marrow edema, hyperemia or early osteomyelitis. No evidence of septic joint. 2. Mild tenosynovitis and synovial enhancement associated with the extensor digitorum tendons at the level of the midfoot. 3. Diffuse low-level muscular T2 hyperintensity and enhancement, most consistent with diabetic myopathy. LEFT FOOT BONE Methicillin resistant staphylococcus aureus Staphylococcus lugdunensis MIC MIC CIPROFLOXACIN >=8 RESISTANT Resistant <=0.5 SENSI... Sensitive CLINDAMYCIN <=0.25 SENS... Sensitive >=8 RESISTANT Resistant ERYTHROMYCIN >=8 RESISTANT Resistant >=8 RESISTANT Resistant GENTAMICIN <=0.5 SENSI... Sensitive <=0.5 SENSI... Sensitive Inducible Clindamycin NEGATIVE Sensitive NEGATIVE Sensitive OXACILLIN >=4 RESISTANT Resistant 2 SENSITIVE Sensitive RIFAMPIN <=0.5 SENSI... Sensitive <=0.5 SENSI... Sensitive TETRACYCLINE <=1 SENSITIVE Sensitive <=1 SENSITIVE Sensitive TRIMETH/SULFA <=10 SENSIT Sensitive <=10 SENSIT Sensitive ... Marland Kitchen.. VANCOMYCIN 1 SENSITIVE Sensitive <=0.5 SENSI... Sensitive Right foot bone . Component 3 wk ago Specimen Description BONE Special Requests RIGHT 4 METATARSAL SAMPLE B Gram Stain NO WBC SEEN NO ORGANISMS SEEN Culture RARE METHICILLIN RESISTANT STAPHYLOCOCCUS AUREUS NO ANAEROBES ISOLATED Performed at [facility], [place] [address], [place], [place] [address] Report Status 10/08/2019 FINAL Organism ID, Bacteria METHICILLIN RESISTANT STAPHYLOCOCCUS AUREUS Resulting Agency [facility] Susceptibility Methicillin resistant staphylococcus aureus MIC CIPROFLOXACIN >=8 RESISTANT Resistant CLINDAMYCIN <=0.25 SENS...  Sensitive ERYTHROMYCIN >=8 RESISTANT Resistant GENTAMICIN <=0.5 SENSI... Sensitive Inducible Clindamycin NEGATIVE Sensitive OXACILLIN >=4 RESISTANT Resistant RIFAMPIN <=0.5 SENSI... Sensitive TETRACYCLINE <=1 SENSITIVE Sensitive TRIMETH/SULFA <=10 SENSIT Sensitive ... VANCOMYCIN 1 SENSITIVE Sensitive This is a patient we had in clinic earlier this year with a wound over his left fifth metatarsal head. 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 Doran Durand on 05/16/2019. He seems to have developed problems with wounds on his bilateral feet in June 2021 although he may have had problems earlier than this. He was in an urgent care with a right foot ulcer on 09/26/2019 and given a course of doxycycline. This was apparently after having trouble getting into see orthopedics. He was seen by podiatry on 09/28/2019 noted to have bilateral lower extremity ulcers including the left lateral fifth metatarsal base and the right subfifth met head. It was noted that had purulent drainage at that time. He required hospitalization from 6/20 through 7/2. This was because of worsening right foot wounds. He underwent bilateral operative incision and drainage and bone biopsies bilaterally. Culture results are listed above. He has been referred back to clinic by Dr. Jacqualyn Posey of podiatry. He is also followed by Dr. Megan Salon who saw him yesterday. He was discharged from hospital on  Zyvox Flagyl and Levaquin and yesterday changed to doxycycline Flagyl and Levaquin. His inflammatory markers on 6/26 showed a sedimentation rate of 129 and a C-reactive protein of 5. This is  improved to 14 and 1.3 respectively. This would indicate improvement. ABIs in our clinic today were 1.23 on the right and 1.20 on the left 11/01/2019 on evaluation today patient appears to be doing fairly well in regard to the wounds on his feet at this point. Fortunately there is no signs of active infection at this time. No fevers, chills, nausea, vomiting, or diarrhea. He currently is seeing infectious disease and still under their care at this point. Subsequently he also has both wounds which she has not been using collagen on as he did not receive that in his packaging he did not call us and let us know that. Apparently that just was missed on the order. Nonetheless we will get that straightened out today. 8/9-Patient returns for bilateral foot wounds, using Prisma with hydrogel moistened dressings, and the wounds appear stable. Patient using surgical shoes, avoiding much pressure or weightbearing as much as possible 8/16; patient has bilateral foot wounds. 1 on the right lateral foot proximally the other is on the left mid lateral foot. Both required debridement of callus and thick skin around the wounds. We have been using silver collagen 8/27; patient has bilateral lateral foot wounds. The area on the left substantially surrounded by callus and dry skin. This was removed from the wound edge. The underlying wound is small. The area on the right measured somewhat smaller today. 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 Unfortunately I did not update his antibiotics during today's visit. 9/10 I reviewed Dr. Hale Bogus last notes he felt he had completed antibiotics his inflammatory markers were reasonably well controlled. He has a small wound on the lateral left foot and a tiny area on the right which is just above closed. He is using Hydrofera Blue with border foam he has bilateral surgical shoes 9/24; 2 week f/u. doing well.  right foot is closed. left foot still undermined. 10/14; right foot remains closed at the fifth met head. The area over the base of the left fifth metatarsal has a small open area but considerable undermining towards the plantar foot. Thick callus skin around this suggests an adequate pressure relief. We have talked about this. He says he is going to go back into his cam boot. I suggested a total contact cast he did not seem enamored with this suggestion 10/26; left foot base of the fifth metatarsal. Same condition as last time. He has skin over the area with an open wound however the skin is not adherent. He went to see Dr. Earleen Newport who did an x-ray and culture of his foot I have not reviewed the x-ray but the patient was not told anything. He is on doxycycline 11/11; since the patient was last here he was in the emergency room on 10/30 he was concerned about swelling in the left foot. They did not do any cultures or x-rays. They changed his antibiotics to cephalexin. Previous culture showed group B strep. The cephalexin is appropriate as doxycycline has less than predictable coverage. Arrives in clinic today with swelling over this area under the wound. He also has a new wound on the right fifth metatarsal head 11/18; the patient has a difficult wound on the lateral aspect of the left fifth metatarsal head. The wound was almost  ballotable last week I opened it slightly expecting to see purulence however there was just bleeding. I cultured this this was negative. X-ray unchanged. We are trying to get an MRI but I am not sure were going to be able to get this through his insurance. He also has an area on the right lateral fifth metatarsal head this looks healthier 12/3; the patient finally got our MRI. Surprisingly this did not show osteomyelitis. I did show the soft tissue ulceration at the lateral plantar aspect of the fifth metatarsal base with a tiny residual 6 mm abscess overlying the superficial  fascia I have tried to culture this area I have not been able to get this to grow anything. Nevertheless the protruding tissue looks aggravated. I suspect we should try to treat the underlying "abscess with broad-spectrum antibiotics. I am going to start him on Levaquin and Flagyl. He has much less edema in his legs and I am going to continue to wrap his legs and see him weekly 12/10. I started Levaquin and Flagyl on him last week. He just picked up the Flagyl apparently there was some delay. The worry is the wound on the left fifth metatarsal base which is substantial and worsening. His foot looks like he inverts at the ankle making this a weightbearing surface. Certainly no improvement in fact I think the measurements of this are somewhat worse. We have been using 12/17; he apparently just got the Levaquin yesterday this is 2 weeks after the fact. He has completed the Flagyl. The area over the left fifth metatarsal base still has protruding granulation tissue although it does not look quite as bad as it did some weeks ago. He has severe bilateral lymphedema although we have not been treating him for wounds on his legs this is definitely going to require compression. There was so much edema in the left I did not wish to put him in a total contact cast today. I am going to increase his compression from 3-4 layer. The area on the right lateral fifth met head actually look quite good and superficial. 12/23; patient arrived with callus on the right fifth met head and the substantial hyper granulated callused wound on the base of his fifth metatarsal. He says he is completing his Levaquin in 2 days but I do not think that adds up with what I gave him but I will have to double check this. We are using Hydrofera Blue on both areas. My plan is to put the left leg in a cast the week after New Year's 04/06/2020; patient's wounds about the same. Right lateral fifth metatarsal head and left lateral foot over the  base of the fifth metatarsal. There is undermining on the left lateral foot which I removed before application of total contact cast continuing with Hydrofera Blue new. Patient tells me he was seen by endocrinology today lab work was done [Dr. Kerr]. Also wondering whether he was referred to cardiology. I went over some lab work from previously does not have chronic renal failure certainly not nephrotic range proteinuria he does have very poorly controlled diabetes but this is not his most updated lab work. Hemoglobin A1c has been over 11 1/10; the patient had a considerable amount of leakage towards mid part of his left foot with macerated skin however the wound surface looks better the area on the right lateral fifth met head is better as well. I am going to change the dressing on the left foot under the total  contact cast to silver alginate, continue with Hydrofera Blue on the right. 1/20; patient was in the total contact cast for 10 days. Considerable amount of drainage although the skin around the wound does not look too bad on the left foot. The area on the right fifth metatarsal head is closed. Our nursing staff reports large amount of drainage out of the left lateral foot wound 1/25; continues with copious amounts of drainage described by our intake staff. PCR culture I did last week showed E. coli and Enterococcus faecalis and low quantities. Multiple resistance genes documented including extended spectrum beta lactamase, MRSA, MRSE, quinolone, tetracycline. The wound is not quite as good this week as it was 5 days ago but about the same size 2/3; continues with copious amounts of malodorous drainage per our intake nurse. The PCR culture I did 2 weeks ago showed E. coli and low quantities of Enterococcus. There were multiple resistance genes detected. I put Neosporin on him last week although this does not seem to have helped. The wound is slightly deeper today. Offloading continues to be an  issue here although with the amount of drainage she has a total contact cast is just not going to work 2/10; moderate amount of drainage. Patient reports he cannot get his stocking on over the dressing. I told him we have to do that the nurse gave him suggestions on how to make this work. The wound is on the bottom and lateral part of his left foot. Is cultured predominantly grew low amounts of Enterococcus, E. coli and anaerobes. There were multiple resistance genes detected including extended spectrum beta lactamase, quinolone, tetracycline. I could not think of an easy oral combination to address this so for now I am going to do topical antibiotics provided by [facility] I think the main agents here are vancomycin and an aminoglycoside. We have to be able to give him access to the wounds to get the topical antibiotic on 2/17; moderate amount of drainage this is unchanged. He has his Keystone topical antibiotic against the deep tissue culture organisms. He has been using this and changing the dressing daily. Silver alginate on the wound surface. 2/24; using Keystone antibiotic with silver alginate on the top. He had too much drainage for a total contact cast at one point although I think that is improving and I think in the next week or 2 it might be possible to replace a total contact cast I did not do this today. In general the wound surface looks healthy however he continues to have thick rims of skin and subcutaneous tissue around the wide area of the circumference which I debrided 06/04/2020 upon evaluation today patient appears to be doing well in regard to his wound. I do feel like he is showing signs of improvement. There is little bit of callus and dead tissue around the edges of the wound as well as what appears to be a little bit of a sinus tract that is off to the side laterally I would perform debridement to clear that away today. 3/17; left lateral foot. The wound looks about the same as I  remember. Not much depth surface looks healthy. No evidence of infection 3/25; left lateral foot. Wound surface looks about the same. Separating epithelium from the circumference. There really is no evidence of infection here however not making progress by my view [place], [name] ([id number]) [id number] Page 6 of 22 3/29; left lateral foot. Surface of the wound again looks reasonably healthy still thick  skin and subcutaneous tissue around the wound margins. There is no evidence of infection. One of the concerns being brought up by the nurses has again the amount of drainage vis--vis continued use of a total contact cast 4/5; left lateral foot at roughly the base of the fifth metatarsal. Nice healthy looking granulated tissue with rims of epithelialization. The overall wound measurements are not any better but the tissue looks healthy. The only concern is the amount of drainage although he has no surrounding maceration with what we have been doing recently to absorb fluid and protect his skin. He also has lymphedema. 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 He states that the kerlix/coban under the TCC rolled down and could not get it back up. He has been using an offloading scooter and has somehow hurt his right foot using this device. This happened last week. He states that the side of his right foot developed a blister and opened. The top of his foot also has a few small open wounds he thinks is due to his socks rubbing in his shoes. He has not been using any dressings to the wound. He denies purulent drainage, fever/chills or erythema to the wounds. 4/22; patient presents for 1 week follow-up. He developed new wounds to the  right foot that were evaluated at last clinic visit. He continues to have a total contact cast to the left leg and he reports no issues. He has been using silver collagen to the right foot wounds with no issues. He denies purulent drainage, fever/chills or erythema to the right foot wounds. He has no complaints today 4/25; patient presents for 1 week follow-up. He has a total contact cast of the left leg and reports no issues. He has been using silver alginate to the right foot wound. He denies purulent drainage, fever/chills or erythema to the right foot wounds. 5/2 patient presents for 1 week follow-up. T contact cast on the left. The wound which is on the base of the plantar foot at the base of the fifth metatarsal otal actually looks quite good and dimensions continue to gradually contract. HOWEVER the area on the right lateral fifth metatarsal head is much larger than what I remember from 2 weeks ago. Once more is he has significant levels of hypergranulation. Noteworthy that he had this same hyper granulated response on his wound on the left foot at one point in time. So much so that he I thought there was an underlying fluid collection. Based on this I think this just needs debridement. 5/9; the wound on the left actually continues to be gradually smaller with a healthy surface. Slight amount of drainage and maceration of the skin around but not too bad. However he has a large wound over the right fifth metatarsal head very much in the same configuration as his left foot wound was initially. I used silver nitrate to address the hyper granulated tissue no mechanical debridement 5/16; area on the left foot did not look as healthy this week deeper thick surrounding macerated skin and subcutaneous tissue. The area on the right foot fifth met head was about the same The area on the right ankle that we identified last week is completely broken down into  an open wound presumably a stocking rubbing  issue 5/23; patient has been using a total contact cast to the left side. He has been using silver alginate underneath. He has also been using silver alginate to the right foot wounds. He has no complaints today. He denies any signs of infection. 5/31; the left-sided wound looks some better measure smaller surface granulation looks better. We have been using silver alginate under the total contact cast The large area on his right fifth met head and right dorsal foot look about the same still using silver alginate 6/6; neither side is good as I was hoping although the surface area dimensions are better. A lot of maceration on his left and right foot around the wound edge. Area on the dorsal right foot looks better. He says he was traveling. I am not sure what does the amount of maceration around the plantar wounds may be drainage issues 6/13; in general the wound surfaces look quite good on both sides. Macerated skin and raised edges around the wound required debridement although in general especially on the left the surface area seems improved. The area on the right dorsal ankle is about the same I thought this would not be such a problem to close 6/20; not much change in either wound although the one on the right looks a little better. Both wounds have thick macerated edges to the skin requiring debridements. We have been using silver alginate. The area on the dorsal right ankle is still open I thought this would be closed. 6/28; patient comes in today with a marked deterioration in the right foot wound fifth met head. Wide area of exposed bone this is a drastic change from last time. The area on the left there we have been casting is stagnant. We have been using silver alginate in both wound areas. 7/5; bone culture I did for PCR last time was positive for Pseudomonas, group B strep, Enterococcus and Staph aureus. There was no suggestion of methicillin resistance or ampicillin resistant genes. This  was resistant to tetracycline however He comes into the clinic today with the area over his right plantar fifth metatarsal head which had been doing so well 2 weeks ago completely necrotic feeling bone. I do not know that this is going to be salvageable. The left foot wound is certainly no smaller but it has a better surface and is superficial. 7/8; patient called in this morning to say that his total contact cast was rubbing against his foot. He states he is doing fine overall. He denies signs of infection. 7/12; continued deterioration in the wound over the right fifth metatarsal head crumbling bone. This is not going to be salvageable. The patient agrees and wants to be referred to Dr. Doran Durand which we will attempt to arrange as soon as possible. I am going to continue him on antibiotics as long as that takes so I will renew those today. The area on the left foot which is the base of the fifth metatarsal continues to look somewhat better. Healthy looking tissue no depth no debridement is necessary here. 7/20; the patient was kindly seen by Dr. Doran Durand of orthopedics on 10/19/2020. 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 Towards this end we have taken him out of the total contact cast on the left we will put him in a wrap with Hydrofera Blue. As I  understand things surgery is planned for 7/21 7/27; patient had his surgery last Thursday. 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 Apparently everything went well we did not still disturb that today The area on the left foot actually looks quite good. He has been much less mobile which probably explains this he did not seem to do well in the total contact cast secondary to drainage and maceration I think. 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  Fortunately there is no evidence of active infection at this time. No fevers, chills, nausea, vomiting, or diarrhea. Overall I think that he is actually doing extremely well. Nonetheless I do believe that he is staying off of this more following the surgery in his right foot that is the reason the left is doing so great. 8/16; left plantar foot wound. This looks smaller than the last time I saw this he is using Hydrofera Blue. The surgical wound on the right foot is being followed by Dr. Doran Durand we did not look at this today. He has surgical shoes on both feet 8/23; left plantar foot wound not as good this week. Surrounding macerated skin and subcutaneous tissue everything looks moist and wet. I do not think he is offloading this adequately. He is using a surgical shoe Apparently the right foot surgical wound is not open although I did not check his foot 8/31; left plantar foot lateral aspect. Much improved this week. He has no maceration. Some improvement in the surface area of the wound but most impressively the depth is come in we are using silver alginate. The patient is a Product/process development scientist. He is asked that we write him a letter so he can go back to work. I have also tried to see if we can write something that will allow him to limit the amount of time that he is on his foot at work. 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 Hopefully the latter can be avoided 9/6; I believe the patient missed an appointment last week. He arrives in today with a wound looking roughly the same certainly no better. Undermining laterally [place], [name] ([id number]) [id number] Page 7 of 22 and also inferiorly. We used molecuLight today in training with the patient's permission.. We are using silver alginate 9/21 wound is measuring bigger this week although this may have to do with the aggressive  circumferential debridement last week in response to the blush fluorescence on the MolecuLight. Culture I did last week showed significant MSSA and E. coli. I put him on Augmentin but he has not started it yet. We are also going to send this for compounded antibiotics at [facility]. There is no evidence of systemic infection 9/29; silver alginate. His Keystone arrived. He is completing Augmentin in 2 days. Offloading in a cam boot. Moderate drainage per our intake staff 10/5; using silver alginate. He has been using his [place]. He has completed his Augmentin. Per our intake nurse still a lot of drainage, far too much to consider a total contact cast. Wound measures about the same. He had the same undermining area that I defined last week from a roughly 11-3. I remove this today 10/12; using silver alginate he is using the [place]. He comes in for a nurse visit hence we are applying [employer] twice a  week. Measuring slightly better today and less notable drainage. Extensive debridement of the wound edge last time 10/18; using topical Keystone and silver alginate and a soft cast. Wound measurements about the same. Drainage was through his soft cast. We are changing this twice a week Tuesdays and Friday 10/25; comes in with moderate drainage. Still using Keystone silver alginate and a soft cast. Wound dimensions completely the same.He has a lot of edema in the left leg he has lymphedema. Asking for [name] to consider wrapping him as he cannot get his stocking on over the soft cast 11/2; comes in with moderate to large drainage slightly smaller in terms of width we have been using [place]. His wound looks satisfactory but not much improvement 11/4; patient presents today for obligatory cast change. Has no issues or complaints today. He denies signs of infection. 11/9; patient traveled this weekend to [place], was on the cast quite a bit. Staining of the cast with black material from his walking boot. Drainage  was not quite as bad as we feared. Using silver alginate and Keystone 11/16; we do not have size for cast therefore we have been putting a soft cast on him since the change on Friday. Still a significant amount of drainage necessitating changing twice a week. We have been using the Keystone at cast changes either hard or soft as well as silver alginate Comes in the clinic with things actually looking fairly good improvement in width. He says his offloading is about the same 02/24/2021 upon evaluation today patient actually comes back in and is doing excellent in regard to his foot ulcer this is significantly smaller even compared to the last visit. The soft cast seems to have done extremely well for him which is great news. I do not see any signs of infection minimal debridement will be needed today. 11/30; left lateral foot much improved half a centimeter improvement in surface area. No evidence of infection. He seems to be doing better with the soft cast in the TCC therefore we will continue with this. He comes back in later in the week for a change with the nurses. This is due to drainage 12/6; no improvement in dimensions. Under illumination some debris on the surface we have been using silver alginate, soft cast. If there is anything optimistic here he seems to have have less drainage 12/13. Dimensions are improved both length and width and slightly in depth. Appears to be quite healthy today. Raised edges of this thick skin and callus around the edges however. He is in a soft cast were bringing him back once for a change on Friday. Drainage is better 12/20. Dimensions are improved. He still has raised edges of thick skin and callus around the edges. We are using a soft cast 12/28; comes in today with thick callus around the wound. Using silver under alginate under a soft cast. I do not think there is much improvement in any measurement 2023 04/06/2021; patient was put in a total contact cast.  Unfortunately not much change in surface area 1/10; not much different still thick callus and skin around the edge in spite of the total contact cast. This was just debrided last week we have been using the [facility] compounded antibiotic and silver alginate under a total contact cast 1/18 the patient's wound on the left side is doing nicely. smaller HOWEVER he comes in today with a wound on the right foot laterally. blister most likely serosangquenous drainage 1/24; the patient continues to do  well in terms of the plantar left foot which is continued to contract using silver alginate under the total contact cast HOWEVER the right lateral foot is bigger with denuded skin around the edges. I used pickups and a #15 scalpel to remove this this looks like the remanence of a large blister. Cannot rule out infection. Culture in this area I did last week showed Staphylococcus lugdunensis few colonies. I am going to try to address this with his [employer] antibiotic that is done so well on the left having linezolid and this should cover the staph 2/1; the patient's wound on his left foot which was the original plantar foot wound thick skin and eschar around the edges even in the total contact cast but the wound surface does not look too bad The real problem is on how his right lateral foot at roughly the base of the fifth metatarsal. The wound is completely necrotic more worrisome than that there is swelling around the edges of this. We have been using silver alginate on both wounds and Keystone on the right foot. Unfortunately I think he is going to require systemic antibiotics while we await cultures. He did not get the x-ray done that we ordered last week [lost the prescription 2/7; disappointingly in the area on the left foot which we are treating with a total contact cast is still not closed although it is much smaller. He continues to have a lot of callus around the wound edge. -Right lateral foot culture  I did last week was negative x-ray also negative for osteomyelitis. 2/15: TCC silver alginate on the left and silver alginate on the right lateral. No real improvement in either area 05/26/2021: T oday, the wounds are roughly the same size as at his previous visit, post-debridement. He continues to endorse fairly substantial drainage, particularly on the right. He has been in a total contact cast on the left. There is still some callus surrounding this lesion. On the right, the periwound skin is quite macerated, along with surrounding callus. The center of the right-sided wound also has some dark, densely adherent material, which is very difficult to remove. 06/02/2021: Today, both wounds are slightly smaller. He has been using zinc oxide ointment around the right ulcer and the degree of maceration has improved markedly. There continues to be an area of nonviable tissue in the center of the right sided ulcer. The left-sided wound, which has been in the total contact cast. Appears clean and the degree of callus around it is less than previously. 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 He had to take the stairs and both wounds have increased in size. The left foot, which has been in a total contact cast, has developed a tunnel tracking to the lateral aspect of his foot. The nonviable tissue in the center of the right-sided ulcer remains recalcitrant to debridement. There is significant undermining surrounding the entirety of the left sided wound. 06/16/2021: The elevator at school has been fixed and the patient has been able to avoid putting as much weight on his wounds over the past week. We converted the left foot wound into a single lesion today, but despite this, the wound is actually smaller. The base is healthy with limited periwound callus. On the right, the central necrotic area is still present. He continues to be quite macerated  around the right sided wound, despite applying barrier cream. This does, however, have the benefit of  softening the callus to make it more easily removable. 06/23/2021: Today, the left wound is smaller. The lateral aspect that had opened up previously is now closed. The wound base has a healthy bed of granulation tissue and minimal slough. Unfortunately, on the right, the wound is larger and continues to be fairly macerated. He has also reopened the wound at his right ankle. He thinks this is due to the gait he has adopted secondary to his total contact cast and boot. 06/30/2021: T oday, both wounds are a little bit larger. The lateral aspect on the left has remained closed. He continues to have significant periwound maceration. The culture that I took from the right sided wound grew a population of bacteria that is not covered by his current [facility] antibiotic. The center of the right- sided wound continues to appear necrotic with nonviable fat. It probes deeper today, but does not reach bone. [place], [name] ([id number]) [id number] Page 8 of 22 07/07/2021: The periwound maceration is a little bit less today. The right lateral foot wound has some areas that appear more viable and the necrotic center also looks a little bit better. The wound on the dorsal surface of his right foot near the ankle is contracting and the surface appears healthy. The left plantar wound surface looks healthy, but there is some new undermining on the medial portion. He did get his new Keystone antibiotic and began applying that to the right foot wound on Saturday. 07/14/2021: The intake nurse reported substantial drainage from his wounds, but the periwound skin actually looks better than is typical for him. The wound on the dorsal surface of his right foot near the ankle is smaller and just has a small open area underneath some dried eschar. The left plantar wound surface looks healthy and there has  been no significant accumulation of callus. The right lateral foot wound looks quite a bit better, with the central portion, which typically appears necrotic, looking more viable albeit pale. 07/22/2021: His left foot is extremely macerated today. The wound is about the same size. The wound on the dorsal surface of his right foot near the ankle had closed, but he traumatized it removing the dressing and there is a tiny skin tear in that location. The right lateral foot wound is bigger, but the surface appears healthy. 07/30/2021: The wound on the dorsal surface of his right foot near the ankle is closed. The right lateral foot wound again is a little bit bigger due to some undermining. The periwound skin is in better condition, however. He has been applying zinc oxide. The wound surface is a little bit dry today. On the left, he does not have the substantial maceration that we frequently see. The wound itself is smaller and has a clean surface. 08/06/2021: Both wounds seem to have deteriorated over the past week. The right lateral foot wound has a dry surface but the periwound is boggy.. Overall wound dimensions are about the same. On the left, the wound is about the same size, but there is more undermining present underneath periwound callus. 08/13/2021: The right sided wound looks about the same, but on the left there has been substantial deterioration. The undermining continues to extend under periwound callus. Once this was removed, substantial extension of the wound was present. There is no odor or purulent drainage but clearly the wounds have broken down. 08/20/2021: The wounds look about the same today. He has been out of his total contact cast and has just been changing  the dressings using topical Keystone with PolyMem Ag, Kerlix and Ace bandages. The wound on the top of his right ankle has reopened but this is quite small. There was a little bit of purulent material that I expressed when examining  this wound. 08/24/2021: After the aggressive debridement I performed at his last visit, the wounds actually look a little bit better today. They are smaller with the exception of the wound on the top of his right ankle which is a little bit bigger as some more skin pulled off when he was changing his dressing. We are using topical Keystone with PolyMem Ag Kerlix and Ace bandages. 09/02/2021: There has been really no change to any of his wounds. 09/16/2021: The patient was hospitalized last week with nausea, vomiting, and dehydration. He says that while he was in the hospital, his wounds were not really addressed properly. T oday, both plantar foot wounds are larger and the periwound skin is macerated. The wound on the dorsum of his right foot has a scab on the top. The right foot now has a crater where previously he had had nonviable fat. It looks as though this simply died and fell out. The periwound callus is wet. 09/24/2021: His wounds have deteriorated somewhat since his last visit. The wound on the dorsum of his right foot near his ankle is larger and has more nonviable tissue present. The crater in his right foot is even deeper; I cannot quite palpate or probe to bone but I am sure it is close. The wound on his left plantar foot has an odd boggy area in the center that almost feels as though it has fluid within it. He has run out of his topical Keystone antibiotic. We are using silver alginate on his wounds. 09/29/2021: He has developed a new wound on the dorsum of his left foot near his ankle. He says he thinks his wrapping is rubbing in that site. I would concur with this as the wound on his right ankle is larger. The left foot looks about the same. The right foot has the crater that was present last week. No significant slough accumulation, but his foot remains quite swollen and warm despite oral antibiotic therapy. 10/08/2021: All of his wounds look about the same as last week. He did not start his  oral antibiotics that are prescribed until just a couple of days ago; his [employer] compounded antibiotics formula has been changed and he is awaiting delivery of the new recipe. His MRI that was scheduled for earlier this week was canceled as no prior authorization had been obtained; unfortunately the tech responsible sent an email to my old [place] email, which I no longer use nor have access to. 10/18/2021: The wounds on his bilateral dorsal feet near the ankles are both improved. They are smaller and have just some eschar and slough buildup. The left plantar wound has a fair amount of undermining, but the surface is clean. There is some periwound callus accumulation. On the right plantar foot, there is nonviable fat leading to a deep tunnel that tracks towards his dorsal medial foot. There is periwound callus and slough accumulation, as well. His right foot and leg remain swollen as compared to the left. 10/25/2021: The wounds on his bilateral dorsal feet and at the ankles have broken down somewhat. They are little bit larger than last week. The left plantar wound continues to undermine laterally but the surface is clean. The right plantar foot wound shows some decreased  depth in the tunnel tracking towards his dorsal medial foot. He has not yet had the Doppler study that I ordered; it sounds like there is some confusion about the scheduling of the procedure. In addition, the MRI was denied and I have taken steps to appeal the denial. 11/24/2021: Since our last visit, Mr. Banducci was admitted to the hospital where an MRI suggested osteomyelitis. He was taken the operating room by podiatry. Bone biopsies were negative for osteomyelitis. They debrided his wounds and applied myriad matrix. He saw them last week and they removed his staples. He is here today to continue his wound healing process. T oday, both of the dorsal foot/ankle wounds are substantially smaller. There is just a little eschar  overlying the left sided wound and some eschar and slough on the right. The right plantar foot ulcer has the healthiest surface of granulation tissue that I have seen to date. A portion of the myriad matrix failed to take and was hanging loose. It appears that myriad morcells were placed into the tunnel closest to the dorsal portion of his foot. These have sloughed off. The left plantar foot ulcer is about the same size, but has a much healthier surface than in the past. Both plantar ulcers have callus and slough accumulation. 12/07/2021: Left dorsal foot/ankle wound is closed. The right dorsal foot/ankle wound is nearly closed and just has a small open area with some eschar and slough. The right plantar foot wound has contracted quite a bit since our last visit. It has a healthy surface with just a little bit of slough accumulation and periwound callus buildup. The left plantar foot wound is about the same size but the surface appears healthy. There is a little slough and periwound callus on this side, as well. 12/15/2021: Both dorsal foot/ankle wounds are closed. The right plantar foot wound is substantially smaller than at our last visit. The tunneling that was present has nearly closed. There is just a little bit of slough buildup. The left plantar foot wound is also a little bit smaller today. The surface is the healthiest that I have ever seen it. Light slough and periwound callus accumulation on this side. 12/22/2021: The dorsal ankle/foot wounds remain closed. The right plantar foot wound continues to contract. There is still a bit of depth at the lateral portion of the wound but the surface has a good granulated appearance. The wound on the left is about the same size, the central indented portion is still adherent. It also is clean with good granulation tissue present. The periwound skin and callus are a bit macerated, however. 12/29/2021: Both ankle wounds remain closed. The right plantar foot  wound is less than half the size that it was last week. There is no depth any longer and the surface has just a little bit of slough and periwound callus. On the left, the wound is not much smaller, but the surface is healthier with good granulation tissue. There is also a little slough and periwound callus buildup. 01/05/2022: The right plantar foot wound continues to contract and is once again about half the size as it was last week. There is just a little bit of surface slough and periwound callus. On the left, the depth of the wound has decreased and the diameter has started to contract. It is also very clean with just a little slough and biofilm present. [place], [name] ([id number]) [id number] Page 9 of 22 01/12/2022: The right plantar foot wound is smaller again today.  There is just some periwound callus and slough accumulation. On the left, there is a fair amount of undermining and the surface is a little boggy, but no other significant change. 01/19/2022: The right plantar wound continues to contract. There is minimal periwound callus accumulation and just a light layer of slough on the surface. On the left, the surface looks better, but there is fairly significant undermining near the 11:00 portion of the wound, aiming towards his great toe. The skin overlying this area is very healthy and has a good fat layer present. No malodor or purulent drainage. 01/26/2022: The right sided wound continues to contract and has just a light layer of slough on the surface. Minimal periwound callus. On the left, he still has substantial undermining and the tissue surface is not as robust as I would like to see. No malodor or purulent drainage, but he did say he had a lot of serous drainage over the weekend. 02/02/2022: The right foot wound is about a quarter of the size that it was at his last visit. There is just a little slough on the surface and some periwound callus. On the  left, the undermining persists and the tissue is still more pale, but he does not seem to have had as much drainage over the last weekend. We are waiting on a wound VAC. 02/09/2022: His right foot has healed. He received the wound VAC, but did not bring it to clinic with him today. The lateral aspect of the left foot wound has come in a little, but he still has substantial undermining on the medial and distal portion of the wound. Still with macerated periwound callus. 02/16/2022: The wound VAC was applied last Friday. T oday, there has been tremendous improvement in the surface of the wound bed. The undermined portion of the wound has come in a bit. There is still some overhanging dead skin. Overall the wound looks much better. 02/23/2022: No significant change in the overall wound dimensions, but the wound surface continues to improve and appears healthier and more robust. There is some periwound callus accumulation and overhanging skin. No concern for infection. 03/02/2022: Although the measurements taken in clinic today were unchanged, the visual appearance of the wound looks like it is smaller and the undermining/tunneling is filling with flesh. There is less periwound tissue maceration than usual. No malodor or purulent drainage. No concern for infection. 03/09/2022: The undermining has come in by couple of millimeters. The periwound is a little bit more macerated this week. No concern for infection. 12/13; substantial wound on the left plantar foot. We are using silver collagen and a wound VAC. 03/23/2022: The undermining continues to contract. The wound surface is a little bit dry. 03/30/2022: The undermining has essentially resolved. There is still some depth at the center of the wound. The wound surface has good beefy tissue and the moisture balance is better. 04/06/2022: The wound dimensions are about the same, except that the depth is beginning to fill in. He has had more drainage this week,  but there is no obvious concern for infection. 04/13/2022: He has built up a little bit of callus around the edges of the wound, creating some undermining. Otherwise the wound looks fairly unchanged. 04/20/2022: The wound bed tissue looks very healthy and robust. Light biofilm on the surface. There is some built up wet callus around the wound edges, but no undermining. 04/27/2022: The wound surface continues to look very healthy. Although the wound dimensions measured the same, it  looks smaller to me. No real callus accumulation this week. Minimal slough on the surface. 05/04/2022: The wound measured smaller and shallower this week. We are still awaiting insurance approval for Apligraf. 05/11/2022: The wound is about the same in terms of depth but measured a little bit narrower today. He has built up a rim of moist callus around the edges. He has been approved for Apligraf and we will apply that today. Electronic Signature(s) Signed: 05/11/2022 9:20:39 AM By: [name] MD FACS Entered By: [name] on 05/11/2022 09:20:39 -------------------------------------------------------------------------------- Physical Exam Details Patient Name: Date of Service: [name], [name] 05/11/2022 8:15 A M Medical Record Number: [id number] Patient Account Number: [id number] Date of Birth/Sex: Treating RN: 05/27/1985 (37 y.o. M) Primary Care Provider: [name] Other Clinician: Referring Provider: Treating Provider/Extender: [name] in Treatment: 132 Constitutional Slightly hypertensive. . . . no acute distress. Respiratory Normal work of breathing on room air. Notes 05/11/2022: The wound is about the same in terms of depth but measured a little bit narrower today. He has built up a rim of moist callus around the edges. [place], [name] ([id number]) [id number] Page 10 of 22 Electronic Signature(s) Signed: 05/11/2022 9:21:09 AM By: [name] MD FACS Entered By: [name] on 05/11/2022 09:21:09 -------------------------------------------------------------------------------- Physician Orders Details Patient Name: Date of Service: [name], [name] 05/11/2022 8:15 A M Medical Record Number: [id number] Patient Account Number: [id number] Date of Birth/Sex: Treating RN: February 04, 1986 (37 y.o. 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 - Dr. [name] - Room 1 with [name] Return A Wednesday 2/14 @ 0815 am Cellular or Tissue Based Products Cellular or Tissue Based Product Type: - Apligraf #1 daptic or Mepitel. (DO NOT REMOVE). - may Cellular or Tissue Based Product applied to wound bed, secured with steri-strips, cover with A change black sponge, leave steristrips in place Bathing/ Shower/ Hygiene May shower and wash wound with soap and water. Negative Presssure Wound Therapy Wound #3 Left,Lateral,Plantar Foot Wound Vac to wound continuously at 178mm/hg pressure Black Foam Edema Control - Lymphedema / SCD / Other Bilateral Lower Extremities Avoid standing for long periods of time. Exercise regularly Compression stocking or Garment 20-30 mm/Hg pressure to: - to both legs daily Off-Loading Open toe surgical shoe to: - Both feet Additional Orders / Instructions Follow Nutritious Diet Non Wound Condition pply the following to affected area as directed: - continue to apply foam donut or cushion to healed right plantar foot A Wound Treatment Wound #3 - Foot Wound Laterality: Plantar, Left, Lateral Cleanser: Soap and Water 3 x  Per Week/30 Days Discharge Instructions: May shower and wash wound with dial antibacterial soap and water prior to dressing change. Peri-Wound Care: Skin Prep (DME) (Generic) 3 x Per Week/30 Days Discharge Instructions: Use skin prep as directed Prim Dressing: Apligraf ary 3 x Per Week/30 Days Prim Dressing: VAC ary 3 x Per Week/30 Days Secondary Dressing: ADAPTIC TOUCH 3x4.25 in 3 x Per Week/30 Days Discharge Instructions: Apply over primary dressing as directed. [place], [name] ([id number]) [id number] Page 11 of 22 Secondary Dressing: Zetuvit Plus 4x8 in (DME) (Dispense As Written) 3 x Per Week/30 Days Discharge Instructions: Apply over primary  dressing as directed. Secured With: Elastic Bandage 4 inch (ACE bandage) (DME) (Generic) 3 x Per Week/30 Days Discharge Instructions: Secure with ACE bandage as directed. Secured With: 67M Medipore Public affairs consultant Surgical T 2x10 (in/yd) (Generic) 3 x Per Week/30 Days ape Discharge Instructions: Secure with tape as directed. Compression Wrap: Kerlix Roll 4.5x3.1 (in/yd) (Generic) 3 x Per Week/30 Days Discharge Instructions: Apply Kerlix and Coban compression as directed. Electronic Signature(s) Signed: 05/11/2022 9:51:55 AM By: [name] MD FACS Entered By: [name] on 05/11/2022 09:21:23 -------------------------------------------------------------------------------- Problem List Details Patient Name: Date of Service: [name], [name] 05/11/2022 8:15 A M Medical Record Number: [id number] Patient Account Number: [id number] Date of Birth/Sex: Treating RN: 1985/12/31 (37 y.o. [name] Primary Care Provider: [name] Other Clinician: Referring Provider: Treating Provider/Extender: [name] in Treatment: 132 Active Problems ICD-10 Encounter Code Description Active Date MDM Diagnosis L97.528 Non-pressure chronic ulcer of other part of left foot with other  specified 10/24/2019 No Yes severity E11.621 Type 2 diabetes mellitus with foot ulcer 10/24/2019 No Yes Inactive Problems ICD-10 Code Description Active Date Inactive Date L97.518 Non-pressure chronic ulcer of other part of right foot with other specified severity 07/14/2020 07/14/2020 L97.518 Non-pressure chronic ulcer of other part of right foot with other specified severity 10/24/2019 10/24/2019 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, right ankle and foot 10/24/2019 10/24/2019 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 08/10/2020 08/10/2020 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, left ankle and foot 10/24/2019 10/24/2019 L97.322 Non-pressure chronic ulcer of left ankle with fat layer exposed 09/29/2021 09/29/2021 B95.62 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 10/24/2019 10/24/2019 classified elsewhere [place], [name] ([id number]) [phone].pdf Page 12 of 22 Resolved Problems Electronic Signature(s) Signed: 05/11/2022 9:19:45 AM By: [name] MD FACS Entered By: [name] on 05/11/2022 09:19:45 -------------------------------------------------------------------------------- Progress Note Details Patient Name: Date of Service: [name], [name] 05/11/2022 8:15 A M Medical Record Number: [id number] Patient Account Number: [id number] Date of Birth/Sex: Treating RN: [birth year] (37 y.o. 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 He comes in for review of a wound over the plantar fifth metatarsal head extending into the lateral part of the foot. He was followed for this previously by his podiatrist Dr. Cornelius Moras. As the patient tells his story he went to see podiatry first for a swelling he developed on the lateral part of his fifth metatarsal head in May. He states this was "open" by podiatry and the area closed. He was followed up in June and it was again opened callus removed and it closed promptly. There were plans being made for surgery on the fifth metatarsal head in June however his blood sugar was apparently too high for anesthesia. Apparently the area was debrided and opened again in June and it is never closed since. Looking over the records from podiatry I am really not able to follow this. It was clear when he was first seen it was before 5/14 at that point he already  had a wound. By 5/17 the ulcer was resolved. I do not see anything about a procedure. On 5/28 noted to have pre-ulcerative moderate keratosis. X-ray noted 1/5 contracted toe and tailor's bunion and metatarsal deformity. On a visit date on 09/28/2018 the dorsal part of the left foot it healed and resolved. There was concern about swelling in his lower extremity he was sent to the ER.. As far as I can tell he was seen in the ER on 7/12 with an ulcer on his left foot. A DVT rule out of the left leg was negative. I do not think I have complete records from podiatry but I am not able to verify the procedures this patient states he had. He states after the last procedure the wound has never closed although I am not able to follow this in the records I have from podiatry. He has not had a recent x-ray The patient has been using Neosporin on the wound. He is wearing a Darco shoe. He is still very active up on his foot working and  exercising. Past medical history; type 2 diabetes ketosis-prone, leg swelling with a negative DVT study in July. Non-smoker ABI in our clinic was 0.85 on the left 10/16; substantial wound on the plantar left fifth met head extending laterally almost to the dorsal fifth MTP. We have been using silver alginate we gave him a Darco forefoot off loader. An x-ray did not show evidence of osteomyelitis did note soft tissue emphysema which I think was due to gas tracking through an open wound. There is no doubt in my mind he requires an MRI 10/23; MRI not booked until 3 November at the earliest this is largely due to his glucose sensor in the right arm. We have been using silver alginate. There has been an improvement 10/29; I am still not exactly sure when his MRI is booked for. He says it is the third but it is the 10th in epic. This definitely needs to be done. He is running a low-grade fever today but no other symptoms. No real improvement in the 1 02/26/2019 patient presents today for a follow-up visit here in our clinic he is last been seen in the clinic on October 29. Subsequently we were working on getting MRI to evaluate and see what exactly was going on and where we would need to go from the standpoint of whether or not he had osteomyelitis and again what treatments were going be required. Subsequently the patient ended up being admitted to the hospital on 02/07/2019 and was discharged on 02/14/2019. This is a somewhat interesting admission with a discharge diagnosis of pneumonia due to COVID-19 although he was positive for COVID-19 when tested at the urgent care but negative x2 when he was actually in the hospital. With that being said he did have acute respiratory failure with hypoxia and it was noted he also have a left foot ulceration with osteomyelitis. With that being said he did require oxygen for his pneumonia and I level 4 L. He was placed on antivirals and steroids for the COVID-19. He was  also transferred to the [place] at one point. Nonetheless he did subsequently discharged home and since being home has done much better in that regard. The CT angiogram did not show any pulmonary embolism. With regard to the osteomyelitis the patient was placed on vancomycin and Zosyn while in the hospital but has been changed to Augmentin at discharge. It was also recommended that he follow-  up with wound care and podiatry. Podiatry however wanted him to see [name] according to the patient prior to them doing anything further. His hemoglobin A1c was 9.9 as noted in the hospital. Have an MRI of the left foot performed while in the hospital on 02/04/2019. This showed evidence of septic arthritis at the fifth MTP joint and osteomyelitis involving the fifth metatarsal head and proximal phalanx. There is an overlying plantar open wound noted an abscess tracking back along the lateral aspect of the fifth metatarsal shaft. There is otherwise diffuse cellulitis and mild fasciitis without findings of polymyositis. The patient did have recently pneumonia secondary to COVID-19 I looked in the chart through epic and it does appear that the patient may need to have an additional x-ray just to ensure everything is cleared and that he has no airspace disease prior to putting him into the [name]. 03/05/2019; patient was readmitted to the clinic last week. He was hospitalized twice for a viral upper respiratory tract infection from 11/1 through 11/4 and then 11/5 through 11/12 ultimately this turned out to be Covid pneumonitis. Although he was discharged on oxygen he is not using it. He says he feels fine. He has no exercise limitation no cough no sputum. His O2 sat in our clinic today was 100% on room air. [place], [name] ([id number]) [id number] Page 13 of 22 He did manage to have his MRI which showed septic arthritis at the fifth MTP joint and osteomyelitis involving the fifth  metatarsal head and proximal phalanx. He received Vanco and Zosyn in the hospital and then was discharged on 2 weeks of Augmentin. I do not see any relevant cultures. He was supposed to follow-up with infectious disease but I do not see that he has an appointment. 12/8; patient saw Dr. Novella Olive of infectious disease last week. He felt that he had had adequate antibiotic therapy. He did not go to follow-up with Dr. Amalia Hailey of podiatry and I have again talked to him about the pros and cons of this. He does not want to consider a ray amputation of this time. He is aware of the risks of recurrence, migration etc. He started HBO today and tolerated this well. He can complete the Augmentin that I gave him last week. I have looked over the lab work that Dr. Chana Bode ordered his C-reactive protein was 3.3 and his sedimentation rate was 17. The C-reactive protein is never really been measurably that high in this patient 12/15; not much change in the wound today however he has undermining along the lateral part of the foot again more extensively than last week. He has some rims of epithelialization. We have been using silver alginate. He is undergoing hyperbarics but did not dive today 12/18; in for his obligatory first total contact cast change. Unfortunately there was pus coming from the undermining area around his fifth metatarsal head. This was cultured but will preclude reapplication of a cast. He is seen in conjunction with HBO 12/24; patient had staph lugdunensis in the wound in the undermining area laterally last time. We put him on doxycycline which should have covered this. The wound looks better today. I am going to give him another week of doxycycline before reattempting the total contact cast 12/31; the patient is completing antibiotics. Hemorrhagic debris in the distal part of the wound with some undermining distally. He also had hyper granulation. Extensive debridement with a #5 curette. The infected  area that was on the lateral part of the fifth met head  is closed over. I do not think he needs any more antibiotics. Patient was seen prior to HBO. Preparations for a total contact cast were made in the cast will be placed post hyperbarics 04/11/19; once again the patient arrives today without complaint. He had been in a cast all week noted that he had heavy drainage this week. This resulted in large raised areas of macerated tissue around the wound 1/14; wound bed looks better slightly smaller. Hydrofera Blue has been changing himself. He had a heavy drainage last week which caused a lot of maceration around the wound so I took him out of a total contact cast he says the drainage is actually better this week He is seen today in conjunction with HBO 1/21; returns to clinic. He was up in [place] for a day or 2 attending a funeral. He comes back in with the wound larger and with a large area of exposed bone. He had osteomyelitis and septic arthritis of the fifth left metatarsal head while he was in hospital. He received IV antibiotics in the hospital for a prolonged period of time then 3 weeks of Augmentin. Subsequently I gave him 2 weeks of doxycycline for more superficial wound infection. When I saw this last week the wound was smaller the surface of the wound looks satisfactory. 1/28; patient missed hyperbarics today. Bone biopsy I did last time showed Enterococcus faecalis and Staphylococcus lugdunensis . He has a wide area of exposed bone. We are going to use silver alginate as of today. I had another ethical discussion with the patient. This would be recurrent osteomyelitis he is already received IV antibiotics. In this situation I think the likelihood of healing this is low. Therefore I have recommended a ray amputation and with the patient's agreement I have referred him to Dr. Doran Durand. The other issue is that his compliance with hyperbarics has been minimal because of his work schedule and given  his underlying decision I am going to stop this today READMISSION 10/24/2019 MRI 09/29/2019 left foot IMPRESSION: 1. Apparent skin ulceration inferior and lateral to the 5th metatarsal base with underlying heterogeneous T2 signal and enhancement in the subcutaneous fat. Small peripherally enhancing fluid collections along the plantar and lateral aspects of the 5th metatarsal base suspicious for abscesses. 2. Interval amputation through the mid 5th metatarsal with nonspecific low-level marrow edema and enhancement. Given the proximity to the adjacent soft tissue inflammatory changes, osteomyelitis cannot be excluded. 3. The additional bones appear unremarkable. MRI 09/29/2019 right foot IMPRESSION: 1. Soft tissue ulceration lateral to the 5th MTP joint. There is low-level T2 hyperintensity within the 4th and 5th metatarsal heads and adjacent proximal phalanges without abnormal T1 signal or cortical destruction. These findings are nonspecific and could be seen with early marrow edema, hyperemia or early osteomyelitis. No evidence of septic joint. 2. Mild tenosynovitis and synovial enhancement associated with the extensor digitorum tendons at the level of the midfoot. 3. Diffuse low-level muscular T2 hyperintensity and enhancement, most consistent with diabetic myopathy. LEFT FOOT BONE Methicillin resistant staphylococcus aureus Staphylococcus lugdunensis MIC MIC CIPROFLOXACIN >=8 RESISTANT Resistant <=0.5 SENSI... Sensitive CLINDAMYCIN <=0.25 SENS... Sensitive >=8 RESISTANT Resistant ERYTHROMYCIN >=8 RESISTANT Resistant >=8 RESISTANT Resistant GENTAMICIN <=0.5 SENSI... Sensitive <=0.5 SENSI... Sensitive Inducible Clindamycin NEGATIVE Sensitive NEGATIVE Sensitive OXACILLIN >=4 RESISTANT Resistant 2 SENSITIVE Sensitive RIFAMPIN <=0.5 SENSI... Sensitive <=0.5 SENSI... Sensitive TETRACYCLINE <=1 SENSITIVE Sensitive <=1 SENSITIVE Sensitive TRIMETH/SULFA <=10 SENSIT Sensitive <=10 SENSIT  Sensitive ... Marland Kitchen.. VANCOMYCIN 1 SENSITIVE Sensitive <=0.5 SENSI... Sensitive Right foot  bone . Component 3 wk ago Specimen Description BONE Special Requests RIGHT 4 METATARSAL SAMPLE B Gram Stain NO WBC SEEN NO ORGANISMS SEEN Culture RARE METHICILLIN RESISTANT STAPHYLOCOCCUS AUREUS NO ANAEROBES ISOLATED Performed at [facility], [place] [address], [place], [place] [address] [place], [name] ([id number]) [id number] Page 14 of 22 Report Status 10/08/2019 FINAL Organism ID, Bacteria METHICILLIN RESISTANT STAPHYLOCOCCUS AUREUS Resulting Agency [facility] Susceptibility Methicillin resistant staphylococcus aureus MIC CIPROFLOXACIN >=8 RESISTANT Resistant CLINDAMYCIN <=0.25 SENS... Sensitive ERYTHROMYCIN >=8 RESISTANT Resistant GENTAMICIN <=0.5 SENSI... Sensitive Inducible Clindamycin NEGATIVE Sensitive OXACILLIN >=4 RESISTANT Resistant RIFAMPIN <=0.5 SENSI... Sensitive TETRACYCLINE <=1 SENSITIVE Sensitive TRIMETH/SULFA <=10 SENSIT Sensitive ... VANCOMYCIN 1 SENSITIVE Sensitive This is a patient we had in clinic earlier this year with a wound over his left fifth metatarsal head. 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 Doran Durand on 05/16/2019. He seems to have developed problems with wounds on his bilateral feet in June 2021 although he may have had problems earlier than this. He was in an urgent care with a right foot ulcer on 09/26/2019 and given a course of doxycycline. This was apparently after having trouble getting into see orthopedics. He was seen by podiatry on 09/28/2019 noted to have bilateral lower extremity ulcers including the left lateral fifth metatarsal base and the  right subfifth met head. It was noted that had purulent drainage at that time. He required hospitalization from 6/20 through 7/2. This was because of worsening right foot wounds. He underwent bilateral operative incision and drainage and bone biopsies bilaterally. Culture results are listed above. He has been referred back to clinic by Dr. Jacqualyn Posey of podiatry. He is also followed by Dr. Megan Salon who saw him yesterday. He was discharged from hospital on Zyvox Flagyl and Levaquin and yesterday changed to doxycycline Flagyl and Levaquin. His inflammatory markers on 6/26 showed a sedimentation rate of 129 and a C-reactive protein of 5. This is improved to 14 and 1.3 respectively. This would indicate improvement. ABIs in our clinic today were 1.23 on the right and 1.20 on the left 11/01/2019 on evaluation today patient appears to be doing fairly well in regard to the wounds on his feet at this point. Fortunately there is no signs of active infection at this time. No fevers, chills, nausea, vomiting, or diarrhea. He currently is seeing infectious disease and still under their care at this point. Subsequently he also has both wounds which she has not been using collagen on as he did not receive that in his packaging he did not call us and let us know that. Apparently that just was missed on the order. Nonetheless we will get that straightened out today. 8/9-Patient returns for bilateral foot wounds, using Prisma with hydrogel moistened dressings, and the wounds appear stable. Patient using surgical shoes, avoiding much pressure or weightbearing as much as possible 8/16; patient has bilateral foot wounds. 1 on the right lateral foot proximally the other is on the left mid lateral foot. Both required debridement of callus and thick skin around the wounds. We have been using silver collagen 8/27; patient has bilateral lateral foot wounds. The area on the left substantially surrounded by callus and dry skin. This  was removed from the wound edge. The underlying wound  is small. The area on the right measured somewhat smaller today. 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 Unfortunately I did not update his antibiotics during today's visit. 9/10 I reviewed Dr. Hale Bogus last notes he felt he had completed antibiotics his inflammatory markers were reasonably well controlled. He has a small wound on the lateral left foot and a tiny area on the right which is just above closed. He is using Hydrofera Blue with border foam he has bilateral surgical shoes 9/24; 2 week f/u. doing well. right foot is closed. left foot still undermined. 10/14; right foot remains closed at the fifth met head. The area over the base of the left fifth metatarsal has a small open area but considerable undermining towards the plantar foot. Thick callus skin around this suggests an adequate pressure relief. We have talked about this. He says he is going to go back into his cam boot. I suggested a total contact cast he did not seem enamored with this suggestion 10/26; left foot base of the fifth metatarsal. Same condition as last time. He has skin over the area with an open wound however the skin is not adherent. He went to see Dr. Earleen Newport who did an x-ray and culture of his foot I have not reviewed the x-ray but the patient was not told anything. He is on doxycycline 11/11; since the patient was last here he was in the emergency room on 10/30 he was concerned about swelling in the left foot. They did not do any cultures or x-rays. They changed his antibiotics to cephalexin. Previous culture showed group B strep. The cephalexin is appropriate as doxycycline has less than predictable coverage. Arrives in clinic today with swelling over this area under the wound. He also has a new wound on the right fifth metatarsal head 11/18; the patient has a difficult wound on the lateral  aspect of the left fifth metatarsal head. The wound was almost ballotable last week I opened it slightly expecting to see purulence however there was just bleeding. I cultured this this was negative. X-ray unchanged. We are trying to get an MRI but I am not sure were going to be able to get this through his insurance. He also has an area on the right lateral fifth metatarsal head this looks healthier 12/3; the patient finally got our MRI. Surprisingly this did not show osteomyelitis. I did show the soft tissue ulceration at the lateral plantar aspect of the fifth metatarsal base with a tiny residual 6 mm abscess overlying the superficial fascia I have tried to culture this area I have not been able to get this to grow anything. Nevertheless the protruding tissue looks aggravated. I suspect we should try to treat the underlying "abscess with broad-spectrum antibiotics. I am going to start him on Levaquin and Flagyl. He has much less edema in his legs and I am going to continue to wrap his legs and see him weekly 12/10. I started Levaquin and Flagyl on him last week. He just picked up the Flagyl apparently there was some delay. The worry is the wound on the left fifth metatarsal base which is substantial and worsening. His foot looks like he inverts at the ankle making this a weightbearing surface. Certainly no improvement in fact I think the measurements of this are somewhat worse. We have been using 12/17; he apparently just got the Levaquin yesterday this is 2 weeks after the fact. He has  completed the Flagyl. The area over the left fifth metatarsal base still has protruding granulation tissue although it does not look quite as bad as it did some weeks ago. He has severe bilateral lymphedema although we have not been treating him for wounds on his legs this is definitely going to require compression. There was so much edema in the left I did not wish to put him in a total contact cast today. I am  going to increase his compression from 3-4 layer. The area on the right lateral fifth met head actually look quite good and superficial. 12/23; patient arrived with callus on the right fifth met head and the substantial hyper granulated callused wound on the base of his fifth metatarsal. He says he is completing his Levaquin in 2 days but I do not think that adds up with what I gave him but I will have to double check this. We are using Hydrofera Blue on both areas. My plan is to put the left leg in a cast the week after New Year's 04/06/2020; patient's wounds about the same. Right lateral fifth metatarsal head and left lateral foot over the base of the fifth metatarsal. There is undermining on the left lateral foot which I removed before application of total contact cast continuing with Hydrofera Blue new. Patient tells me he was seen by endocrinology today lab work was done [Dr. Kerr]. Also wondering whether he was referred to cardiology. I went over some lab work from previously does not have chronic renal failure certainly not nephrotic range proteinuria he does have very poorly controlled diabetes but this is not his most updated lab work. Hemoglobin A1c has been over 11 1/10; the patient had a considerable amount of leakage towards mid part of his left foot with macerated skin however the wound surface looks better the area on the right lateral fifth met head is better as well. I am going to change the dressing on the left foot under the total contact cast to silver alginate, continue with Hydrofera Blue on the right. 1/20; patient was in the total contact cast for 10 days. Considerable amount of drainage although the skin around the wound does not look too bad on the left foot. The area on the right fifth metatarsal head is closed. Our nursing staff reports large amount of drainage out of the left lateral foot wound 1/25; continues with copious amounts of drainage described by our intake staff.  PCR culture I did last week showed E. coli and Enterococcus faecalis and low quantities. Multiple resistance genes documented including extended spectrum beta lactamase, MRSA, MRSE, quinolone, tetracycline. The wound is not quite as good this week as it was 5 days ago but about the same size [place], [name] ([id number]) [id number] Page 15 of 22 2/3; continues with copious amounts of malodorous drainage per our intake nurse. The PCR culture I did 2 weeks ago showed E. coli and low quantities of Enterococcus. There were multiple resistance genes detected. I put Neosporin on him last week although this does not seem to have helped. The wound is slightly deeper today. Offloading continues to be an issue here although with the amount of drainage she has a total contact cast is just not going to work 2/10; moderate amount of drainage. Patient reports he cannot get his stocking on over the dressing. I told him we have to do that the nurse gave him suggestions on how to make this work. The wound is on the bottom and lateral  part of his left foot. Is cultured predominantly grew low amounts of Enterococcus, E. coli and anaerobes. There were multiple resistance genes detected including extended spectrum beta lactamase, quinolone, tetracycline. I could not think of an easy oral combination to address this so for now I am going to do topical antibiotics provided by [facility] I think the main agents here are vancomycin and an aminoglycoside. We have to be able to give him access to the wounds to get the topical antibiotic on 2/17; moderate amount of drainage this is unchanged. He has his Keystone topical antibiotic against the deep tissue culture organisms. He has been using this and changing the dressing daily. Silver alginate on the wound surface. 2/24; using Keystone antibiotic with silver alginate on the top. He had too much drainage for a total contact cast at one point although I  think that is improving and I think in the next week or 2 it might be possible to replace a total contact cast I did not do this today. In general the wound surface looks healthy however he continues to have thick rims of skin and subcutaneous tissue around the wide area of the circumference which I debrided 06/04/2020 upon evaluation today patient appears to be doing well in regard to his wound. I do feel like he is showing signs of improvement. There is little bit of callus and dead tissue around the edges of the wound as well as what appears to be a little bit of a sinus tract that is off to the side laterally I would perform debridement to clear that away today. 3/17; left lateral foot. The wound looks about the same as I remember. Not much depth surface looks healthy. No evidence of infection 3/25; left lateral foot. Wound surface looks about the same. Separating epithelium from the circumference. There really is no evidence of infection here however not making progress by my view 3/29; left lateral foot. Surface of the wound again looks reasonably healthy still thick skin and subcutaneous tissue around the wound margins. There is no evidence of infection. One of the concerns being brought up by the nurses has again the amount of drainage vis--vis continued use of a total contact cast 4/5; left lateral foot at roughly the base of the fifth metatarsal. Nice healthy looking granulated tissue with rims of epithelialization. The overall wound measurements are not any better but the tissue looks healthy. The only concern is the amount of drainage although he has no surrounding maceration with what we have been doing recently to absorb fluid and protect his skin. He also has lymphedema. 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 He states that the kerlix/coban under the TCC rolled down and could not get it back up. He has been using an offloading scooter and has somehow hurt his right foot using this device. This happened last week. He states that the side of his right foot developed a blister and opened. The top of his foot also has a few small open wounds he thinks is due to his socks rubbing in his shoes. He has not been using any dressings to the wound. He denies purulent drainage, fever/chills or erythema to the wounds. 4/22; patient presents for 1 week  follow-up. He developed new wounds to the right foot that were evaluated at last clinic visit. He continues to have a total contact cast to the left leg and he reports no issues. He has been using silver collagen to the right foot wounds with no issues. He denies purulent drainage, fever/chills or erythema to the right foot wounds. He has no complaints today 4/25; patient presents for 1 week follow-up. He has a total contact cast of the left leg and reports no issues. He has been using silver alginate to the right foot wound. He denies purulent drainage, fever/chills or erythema to the right foot wounds. 5/2 patient presents for 1 week follow-up. T contact cast on the left. The wound which is on the base of the plantar foot at the base of the fifth metatarsal otal actually looks quite good and dimensions continue to gradually contract. HOWEVER the area on the right lateral fifth metatarsal head is much larger than what I remember from 2 weeks ago. Once more is he has significant levels of hypergranulation. Noteworthy that he had this same hyper granulated response on his wound on the left foot at one point in time. So much so that he I thought there was an underlying fluid collection. Based on this I think this just needs debridement. 5/9; the wound on the left actually continues to be gradually  smaller with a healthy surface. Slight amount of drainage and maceration of the skin around but not too bad. However he has a large wound over the right fifth metatarsal head very much in the same configuration as his left foot wound was initially. I used silver nitrate to address the hyper granulated tissue no mechanical debridement 5/16; area on the left foot did not look as healthy this week deeper thick surrounding macerated skin and subcutaneous tissue. oo The area on the right foot fifth met head was about the same oo The area on the right ankle that we identified last week is completely broken down into an open wound presumably a stocking rubbing issue 5/23; patient has been using a total contact cast to the left side. He has been using silver alginate underneath. He has also been using silver alginate to the right foot wounds. He has no complaints today. He denies any signs of infection. 5/31; the left-sided wound looks some better measure smaller surface granulation looks better. We have been using silver alginate under the total contact cast oo The large area on his right fifth met head and right dorsal foot look about the same still using silver alginate 6/6; neither side is good as I was hoping although the surface area dimensions are better. A lot of maceration on his left and right foot around the wound edge. Area on the dorsal right foot looks better. He says he was traveling. I am not sure what does the amount of maceration around the plantar wounds may be drainage issues 6/13; in general the wound surfaces look quite good on both sides. Macerated skin and raised edges around the wound required debridement although in general especially on the left the surface area seems improved. oo The area on the right dorsal ankle is about the same I thought this would not be such a problem to close 6/20; not much change in either wound although the one on the right looks a little better.  Both wounds have thick macerated edges to the skin requiring debridements. We have been using silver alginate. The area on the dorsal right  ankle is still open I thought this would be closed. 6/28; patient comes in today with a marked deterioration in the right foot wound fifth met head. Wide area of exposed bone this is a drastic change from last time. The area on the left there we have been casting is stagnant. We have been using silver alginate in both wound areas. 7/5; bone culture I did for PCR last time was positive for Pseudomonas, group B strep, Enterococcus and Staph aureus. There was no suggestion of methicillin resistance or ampicillin resistant genes. This was resistant to tetracycline however He comes into the clinic today with the area over his right plantar fifth metatarsal head which had been doing so well 2 weeks ago completely necrotic feeling bone. I do not know that this is going to be salvageable. The left foot wound is certainly no smaller but it has a better surface and is superficial. 7/8; patient called in this morning to say that his total contact cast was rubbing against his foot. He states he is doing fine overall. He denies signs of infection. 7/12; continued deterioration in the wound over the right fifth metatarsal head crumbling bone. This is not going to be salvageable. The patient agrees and wants to be referred to Dr. Doran Durand which we will attempt to arrange as soon as possible. I am going to continue him on antibiotics as long as that takes so I will renew those today. The area on the left foot which is the base of the fifth metatarsal continues to look somewhat better. Healthy looking tissue no depth no debridement is necessary here. 7/20; the patient was kindly seen by Dr. Doran Durand of orthopedics on 10/19/2020. 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 Towards this end we  have taken him out of the total contact cast on the left we will put him in a wrap with Hydrofera Blue. As I understand things surgery is planned for 7/21 7/27; patient had his surgery last Thursday. 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 Apparently everything went well we did not still disturb that today The area on the left foot actually looks quite good. He has been much less mobile which probably explains this he did not seem to do well in the total contact [place], [name] ([id number]) [id number] Page 16 of 22 cast secondary to drainage and maceration I think. 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 Fortunately there is no evidence of active infection at this time. No fevers, chills, nausea, vomiting, or diarrhea. Overall I think that he is actually doing extremely well. Nonetheless I do believe that he is staying off of this more following the surgery in his right foot that is the reason the left is doing so great. 8/16; left plantar foot wound. This looks smaller than the last time I saw this he is using Hydrofera Blue. The surgical wound on the right foot is being followed by Dr. Doran Durand we did not look at this today. He has surgical shoes on both feet 8/23; left plantar foot wound not as good this week. Surrounding macerated skin and subcutaneous tissue everything looks moist and wet. I do not think he is offloading this adequately. He is using a surgical shoe Apparently the right foot surgical wound is not  open although I did not check his foot 8/31; left plantar foot lateral aspect. Much improved this week. He has no maceration. Some improvement in the surface area of the wound but most impressively the depth is come in we are using silver alginate. The patient is a Product/process development scientist. He is asked that we write him a letter so he can go back to  work. I have also tried to see if we can write something that will allow him to limit the amount of time that he is on his foot at work. 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 Hopefully the latter can be avoided 9/6; I believe the patient missed an appointment last week. He arrives in today with a wound looking roughly the same certainly no better. Undermining laterally and also inferiorly. We used molecuLight today in training with the patient's permission.. We are using silver alginate 9/21 wound is measuring bigger this week although this may have to do with the aggressive circumferential debridement last week in response to the blush fluorescence on the MolecuLight. Culture I did last week showed significant MSSA and E. coli. I put him on Augmentin but he has not started it yet. We are also going to send this for compounded antibiotics at [facility]. There is no evidence of systemic infection 9/29; silver alginate. His Keystone arrived. He is completing Augmentin in 2 days. Offloading in a cam boot. Moderate drainage per our intake staff 10/5; using silver alginate. He has been using his [place]. He has completed his Augmentin. Per our intake nurse still a lot of drainage, far too much to consider a total contact cast. Wound measures about the same. He had the same undermining area that I defined last week from a roughly 11-3. I remove this today 10/12; using silver alginate he is using the [place]. He comes in for a nurse visit hence we are applying [employer] twice a week. Measuring slightly better today and less notable drainage. Extensive debridement of the wound edge last time 10/18; using topical Keystone and silver alginate and a soft cast. Wound measurements about the same. Drainage was through his soft cast. We are changing this twice a week Tuesdays and Friday 10/25; comes in with moderate drainage. Still  using Keystone silver alginate and a soft cast. Wound dimensions completely the same.He has a lot of edema in the left leg he has lymphedema. Asking for [name] to consider wrapping him as he cannot get his stocking on over the soft cast 11/2; comes in with moderate to large drainage slightly smaller in terms of width we have been using [place]. His wound looks satisfactory but not much improvement 11/4; patient presents today for obligatory cast change. Has no issues or complaints today. He denies signs of infection. 11/9; patient traveled this weekend to [place], was on the cast quite a bit. Staining of the cast with black material from his walking boot. Drainage was not quite as bad as we feared. Using silver alginate and Keystone 11/16; we do not have size for cast therefore we have been putting a soft cast on him since the change on Friday. Still a significant amount of drainage necessitating changing twice a week. We have been using the Keystone at cast changes either hard or soft as well as silver alginate Comes in the clinic with things actually looking fairly good improvement in width. He says his offloading is  about the same 02/24/2021 upon evaluation today patient actually comes back in and is doing excellent in regard to his foot ulcer this is significantly smaller even compared to the last visit. The soft cast seems to have done extremely well for him which is great news. I do not see any signs of infection minimal debridement will be needed today. 11/30; left lateral foot much improved half a centimeter improvement in surface area. No evidence of infection. He seems to be doing better with the soft cast in the TCC therefore we will continue with this. He comes back in later in the week for a change with the nurses. This is due to drainage 12/6; no improvement in dimensions. Under illumination some debris on the surface we have been using silver alginate, soft cast. If there is  anything optimistic here he seems to have have less drainage 12/13. Dimensions are improved both length and width and slightly in depth. Appears to be quite healthy today. Raised edges of this thick skin and callus around the edges however. He is in a soft cast were bringing him back once for a change on Friday. Drainage is better 12/20. Dimensions are improved. He still has raised edges of thick skin and callus around the edges. We are using a soft cast 12/28; comes in today with thick callus around the wound. Using silver under alginate under a soft cast. I do not think there is much improvement in any measurement 2023 04/06/2021; patient was put in a total contact cast. Unfortunately not much change in surface area 1/10; not much different still thick callus and skin around the edge in spite of the total contact cast. This was just debrided last week we have been using the [facility] compounded antibiotic and silver alginate under a total contact cast 1/18 the patient's wound on the left side is doing nicely. smaller HOWEVER he comes in today with a wound on the right foot laterally. blister most likely serosangquenous drainage 1/24; the patient continues to do well in terms of the plantar left foot which is continued to contract using silver alginate under the total contact cast HOWEVER the right lateral foot is bigger with denuded skin around the edges. I used pickups and a #15 scalpel to remove this this looks like the remanence of a large blister. Cannot rule out infection. Culture in this area I did last week showed Staphylococcus lugdunensis few colonies. I am going to try to address this with his [employer] antibiotic that is done so well on the left having linezolid and this should cover the staph 2/1; the patient's wound on his left foot which was the original plantar foot wound thick skin and eschar around the edges even in the total contact cast but the wound surface does not look too  bad The real problem is on how his right lateral foot at roughly the base of the fifth metatarsal. The wound is completely necrotic more worrisome than that there is swelling around the edges of this. We have been using silver alginate on both wounds and Keystone on the right foot. Unfortunately I think he is going to require systemic antibiotics while we await cultures. He did not get the x-ray done that we ordered last week [lost the prescription 2/7; disappointingly in the area on the left foot which we are treating with a total contact cast is still not closed although it is much smaller. He continues to have a lot of callus around the wound edge. -Right  lateral foot culture I did last week was negative x-ray also negative for osteomyelitis. 2/15: TCC silver alginate on the left and silver alginate on the right lateral. No real improvement in either area 05/26/2021: T oday, the wounds are roughly the same size as at his previous visit, post-debridement. He continues to endorse fairly substantial drainage, particularly on the right. He has been in a total contact cast on the left. There is still some callus surrounding this lesion. On the right, the periwound skin is quite macerated, along with surrounding callus. The center of the right-sided wound also has some dark, densely adherent material, which is very difficult to remove. [place], [name] ([id number]) [id number] Page 17 of 22 06/02/2021: Today, both wounds are slightly smaller. He has been using zinc oxide ointment around the right ulcer and the degree of maceration has improved markedly. There continues to be an area of nonviable tissue in the center of the right sided ulcer. The left-sided wound, which has been in the total contact cast. Appears clean and the degree of callus around it is less than previously. 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 He had to take the stairs and both wounds have increased in size. The left foot, which has been in a total contact cast, has developed a tunnel tracking to the lateral aspect of his foot. The nonviable tissue in the center of the right-sided ulcer remains recalcitrant to debridement. There is significant undermining surrounding the entirety of the left sided wound. 06/16/2021: The elevator at school has been fixed and the patient has been able to avoid putting as much weight on his wounds over the past week. We converted the left foot wound into a single lesion today, but despite this, the wound is actually smaller. The base is healthy with limited periwound callus. On the right, the central necrotic area is still present. He continues to be quite macerated around the right sided wound, despite applying barrier cream. This does, however, have the benefit of softening the callus to make it more easily removable. 06/23/2021: Today, the left wound is smaller. The lateral aspect that had opened up previously is now closed. The wound base has a healthy bed of granulation tissue and minimal slough. Unfortunately, on the right, the wound is larger and continues to be fairly macerated. He has also reopened the wound at his right ankle. He thinks this is due to the gait he has adopted secondary to his total contact cast and boot. 06/30/2021: T oday, both wounds are a little bit larger. The lateral aspect on the left has remained closed. He continues to have significant periwound maceration. The culture that I took from the right sided wound grew a population of bacteria that is not covered by his current [facility] antibiotic. The center of the right- sided wound continues to appear necrotic with nonviable fat. It probes deeper today, but does not reach bone. 07/07/2021: The periwound maceration is a little bit less today. The right lateral foot wound has some areas that appear more viable and the necrotic  center also looks a little bit better. The wound on the dorsal surface of his right foot near the ankle is contracting and the surface appears healthy. The left plantar wound surface looks healthy, but there is some new undermining on the medial portion. He did get his new Keystone antibiotic and began applying that to the right foot wound on Saturday. 07/14/2021: The intake  nurse reported substantial drainage from his wounds, but the periwound skin actually looks better than is typical for him. The wound on the dorsal surface of his right foot near the ankle is smaller and just has a small open area underneath some dried eschar. The left plantar wound surface looks healthy and there has been no significant accumulation of callus. The right lateral foot wound looks quite a bit better, with the central portion, which typically appears necrotic, looking more viable albeit pale. 07/22/2021: His left foot is extremely macerated today. The wound is about the same size. The wound on the dorsal surface of his right foot near the ankle had closed, but he traumatized it removing the dressing and there is a tiny skin tear in that location. The right lateral foot wound is bigger, but the surface appears healthy. 07/30/2021: The wound on the dorsal surface of his right foot near the ankle is closed. The right lateral foot wound again is a little bit bigger due to some undermining. The periwound skin is in better condition, however. He has been applying zinc oxide. The wound surface is a little bit dry today. On the left, he does not have the substantial maceration that we frequently see. The wound itself is smaller and has a clean surface. 08/06/2021: Both wounds seem to have deteriorated over the past week. The right lateral foot wound has a dry surface but the periwound is boggy.. Overall wound dimensions are about the same. On the left, the wound is about the same size, but there is more undermining present  underneath periwound callus. 08/13/2021: The right sided wound looks about the same, but on the left there has been substantial deterioration. The undermining continues to extend under periwound callus. Once this was removed, substantial extension of the wound was present. There is no odor or purulent drainage but clearly the wounds have broken down. 08/20/2021: The wounds look about the same today. He has been out of his total contact cast and has just been changing the dressings using topical Keystone with PolyMem Ag, Kerlix and Ace bandages. The wound on the top of his right ankle has reopened but this is quite small. There was a little bit of purulent material that I expressed when examining this wound. 08/24/2021: After the aggressive debridement I performed at his last visit, the wounds actually look a little bit better today. They are smaller with the exception of the wound on the top of his right ankle which is a little bit bigger as some more skin pulled off when he was changing his dressing. We are using topical Keystone with PolyMem Ag Kerlix and Ace bandages. 09/02/2021: There has been really no change to any of his wounds. 09/16/2021: The patient was hospitalized last week with nausea, vomiting, and dehydration. He says that while he was in the hospital, his wounds were not really addressed properly. T oday, both plantar foot wounds are larger and the periwound skin is macerated. The wound on the dorsum of his right foot has a scab on the top. The right foot now has a crater where previously he had had nonviable fat. It looks as though this simply died and fell out. The periwound callus is wet. 09/24/2021: His wounds have deteriorated somewhat since his last visit. The wound on the dorsum of his right foot near his ankle is larger and has more nonviable tissue present. The crater in his right foot is even deeper; I cannot quite palpate or probe to bone  but I am sure it is close. The wound on his  left plantar foot has an odd boggy area in the center that almost feels as though it has fluid within it. He has run out of his topical Keystone antibiotic. We are using silver alginate on his wounds. 09/29/2021: He has developed a new wound on the dorsum of his left foot near his ankle. He says he thinks his wrapping is rubbing in that site. I would concur with this as the wound on his right ankle is larger. The left foot looks about the same. The right foot has the crater that was present last week. No significant slough accumulation, but his foot remains quite swollen and warm despite oral antibiotic therapy. 10/08/2021: All of his wounds look about the same as last week. He did not start his oral antibiotics that are prescribed until just a couple of days ago; his [employer] compounded antibiotics formula has been changed and he is awaiting delivery of the new recipe. His MRI that was scheduled for earlier this week was canceled as no prior authorization had been obtained; unfortunately the tech responsible sent an email to my old [employer] email, which I no longer use nor have access to. 10/18/2021: The wounds on his bilateral dorsal feet near the ankles are both improved. They are smaller and have just some eschar and slough buildup. The left plantar wound has a fair amount of undermining, but the surface is clean. There is some periwound callus accumulation. On the right plantar foot, there is nonviable fat leading to a deep tunnel that tracks towards his dorsal medial foot. There is periwound callus and slough accumulation, as well. His right foot and leg remain swollen as compared to the left. 10/25/2021: The wounds on his bilateral dorsal feet and at the ankles have broken down somewhat. They are little bit larger than last week. The left plantar wound continues to undermine laterally but the surface is clean. The right plantar foot wound shows some decreased depth in the tunnel tracking towards  his dorsal medial foot. He has not yet had the Doppler study that I ordered; it sounds like there is some confusion about the scheduling of the procedure. In addition, the MRI was denied and I have taken steps to appeal the denial. 11/24/2021: Since our last visit, Mr. Normil was admitted to the hospital where an MRI suggested osteomyelitis. He was taken the operating room by podiatry. Bone biopsies were negative for osteomyelitis. They debrided his wounds and applied myriad matrix. He saw them last week and they removed his staples. He is here today to continue his wound healing process. T oday, both of the dorsal foot/ankle wounds are substantially smaller. There is just a little eschar overlying the left sided wound and some eschar and slough on the right. The right plantar foot ulcer has the healthiest surface of granulation tissue that I have seen to date. A portion of the myriad matrix failed to take and was hanging loose. It appears that myriad morcells were placed into the tunnel closest to the dorsal portion of his foot. These have sloughed off. The left plantar foot ulcer is about the same size, but has a much healthier surface than in the past. Both plantar ulcers have callus and slough accumulation. [place], [name] ([id number]) [id number] Page 18 of 22 12/07/2021: Left dorsal foot/ankle wound is closed. The right dorsal foot/ankle wound is nearly closed and just has a small open area with some eschar and slough.  The right plantar foot wound has contracted quite a bit since our last visit. It has a healthy surface with just a little bit of slough accumulation and periwound callus buildup. The left plantar foot wound is about the same size but the surface appears healthy. There is a little slough and periwound callus on this side, as well. 12/15/2021: Both dorsal foot/ankle wounds are closed. The right plantar foot wound is substantially smaller than at our last  visit. The tunneling that was present has nearly closed. There is just a little bit of slough buildup. The left plantar foot wound is also a little bit smaller today. The surface is the healthiest that I have ever seen it. Light slough and periwound callus accumulation on this side. 12/22/2021: The dorsal ankle/foot wounds remain closed. The right plantar foot wound continues to contract. There is still a bit of depth at the lateral portion of the wound but the surface has a good granulated appearance. The wound on the left is about the same size, the central indented portion is still adherent. It also is clean with good granulation tissue present. The periwound skin and callus are a bit macerated, however. 12/29/2021: Both ankle wounds remain closed. The right plantar foot wound is less than half the size that it was last week. There is no depth any longer and the surface has just a little bit of slough and periwound callus. On the left, the wound is not much smaller, but the surface is healthier with good granulation tissue. There is also a little slough and periwound callus buildup. 01/05/2022: The right plantar foot wound continues to contract and is once again about half the size as it was last week. There is just a little bit of surface slough and periwound callus. On the left, the depth of the wound has decreased and the diameter has started to contract. It is also very clean with just a little slough and biofilm present. 01/12/2022: The right plantar foot wound is smaller again today. There is just some periwound callus and slough accumulation. On the left, there is a fair amount of undermining and the surface is a little boggy, but no other significant change. 01/19/2022: The right plantar wound continues to contract. There is minimal periwound callus accumulation and just a light layer of slough on the surface. On the left, the surface looks better, but there is fairly significant undermining  near the 11:00 portion of the wound, aiming towards his great toe. The skin overlying this area is very healthy and has a good fat layer present. No malodor or purulent drainage. 01/26/2022: The right sided wound continues to contract and has just a light layer of slough on the surface. Minimal periwound callus. On the left, he still has substantial undermining and the tissue surface is not as robust as I would like to see. No malodor or purulent drainage, but he did say he had a lot of serous drainage over the weekend. 02/02/2022: The right foot wound is about a quarter of the size that it was at his last visit. There is just a little slough on the surface and some periwound callus. On the left, the undermining persists and the tissue is still more pale, but he does not seem to have had as much drainage over the last weekend. We are waiting on a wound VAC. 02/09/2022: His right foot has healed. He received the wound VAC, but did not bring it to clinic with him today. The lateral aspect  of the left foot wound has come in a little, but he still has substantial undermining on the medial and distal portion of the wound. Still with macerated periwound callus. 02/16/2022: The wound VAC was applied last Friday. T oday, there has been tremendous improvement in the surface of the wound bed. The undermined portion of the wound has come in a bit. There is still some overhanging dead skin. Overall the wound looks much better. 02/23/2022: No significant change in the overall wound dimensions, but the wound surface continues to improve and appears healthier and more robust. There is some periwound callus accumulation and overhanging skin. No concern for infection. 03/02/2022: Although the measurements taken in clinic today were unchanged, the visual appearance of the wound looks like it is smaller and the undermining/tunneling is filling with flesh. There is less periwound tissue maceration than usual. No malodor or  purulent drainage. No concern for infection. 03/09/2022: The undermining has come in by couple of millimeters. The periwound is a little bit more macerated this week. No concern for infection. 12/13; substantial wound on the left plantar foot. We are using silver collagen and a wound VAC. 03/23/2022: The undermining continues to contract. The wound surface is a little bit dry. 03/30/2022: The undermining has essentially resolved. There is still some depth at the center of the wound. The wound surface has good beefy tissue and the moisture balance is better. 04/06/2022: The wound dimensions are about the same, except that the depth is beginning to fill in. He has had more drainage this week, but there is no obvious concern for infection. 04/13/2022: He has built up a little bit of callus around the edges of the wound, creating some undermining. Otherwise the wound looks fairly unchanged. 04/20/2022: The wound bed tissue looks very healthy and robust. Light biofilm on the surface. There is some built up wet callus around the wound edges, but no undermining. 04/27/2022: The wound surface continues to look very healthy. Although the wound dimensions measured the same, it looks smaller to me. No real callus accumulation this week. Minimal slough on the surface. 05/04/2022: The wound measured smaller and shallower this week. We are still awaiting insurance approval for Apligraf. 05/11/2022: The wound is about the same in terms of depth but measured a little bit narrower today. He has built up a rim of moist callus around the edges. He has been approved for Apligraf and we will apply that today. Patient History Information obtained from Patient. Family History No family history of Cancer, Diabetes, Hereditary Spherocytosis, Hypertension, Kidney Disease, Lung Disease, Seizures, Stroke, Thyroid Problems, Tuberculosis. Social History Never smoker, Marital Status - Single, Alcohol Use - Rarely, Drug Use - No  History, Caffeine Use - Never. Medical History Eyes Denies history of Cataracts, Glaucoma, Optic Neuritis Ear/Nose/Mouth/Throat Denies history of Chronic sinus problems/congestion, Middle ear problems Hematologic/Lymphatic Denies history of Anemia, Hemophilia, Human Immunodeficiency Virus, Lymphedema, Sickle Cell Disease Respiratory [place], [name] ([id number] Page 19 of 22 Denies history of Aspiration, Asthma, Chronic Obstructive Pulmonary Disease (COPD), Pneumothorax, Sleep Apnea, Tuberculosis Cardiovascular Denies history of Angina, Arrhythmia, Congestive Heart Failure, Coronary Artery Disease, Deep Vein Thrombosis, Hypertension, Hypotension, Myocardial Infarction, Peripheral Arterial Disease, Peripheral Venous Disease, Phlebitis, Vasculitis Gastrointestinal Denies history of Cirrhosis , Colitis, Crohnoos, Hepatitis A, Hepatitis B, Hepatitis C Endocrine Patient has history of Type II Diabetes Denies history of Type I Diabetes Immunological Denies history of Lupus Erythematosus, Raynaudoos, Scleroderma Integumentary (Skin) Denies history of History of Burn Musculoskeletal Denies history of Gout, Rheumatoid  Arthritis, Osteoarthritis, Osteomyelitis Neurologic Denies history of Dementia, Neuropathy, Quadriplegia, Paraplegia, Seizure Disorder Oncologic Denies history of Received Chemotherapy, Received Radiation Psychiatric Denies history of Anorexia/bulimia, Confinement Anxiety Hospitalization/Surgery History - 11/1-11/06/2018- sepsis foot infection. - 11/4-11/5 02 sats low respiratory distress. Objective Constitutional Slightly hypertensive. no acute distress. Vitals Time Taken: 8:21 AM, Height: 77 in, Weight: 280 lbs, BMI: 33.2, Temperature: 98.1 F, Pulse: 94 bpm, Respiratory Rate: 18 breaths/min, Blood Pressure: 145/90 mmHg, Capillary Blood Glucose: 93 mg/dl. General Notes: glucose per pt report this am Respiratory Normal work of  breathing on room air. General Notes: 05/11/2022: The wound is about the same in terms of depth but measured a little bit narrower today. He has built up a rim of moist callus around the edges. Integumentary (Hair, Skin) Wound #3 status is Open. Original cause of wound was Trauma. The date acquired was: 10/02/2019. The wound has been in treatment 132 weeks. The wound is located on the [place]. The wound measures 2.9cm length x 3.4cm width x 1.6cm depth; 7.744cm^2 area and 12.39cm^3 volume. There is Fat Layer (Subcutaneous Tissue) exposed. There is undermining starting at 11:00 and ending at 8:00 with a maximum distance of 0.8cm. There is a medium amount of serosanguineous drainage noted. Foul odor after cleansing was noted. The wound margin is thickened. There is large (67-100%) red granulation within the wound bed. There is a small (1-33%) amount of necrotic tissue within the wound bed including Adherent Slough. The periwound skin appearance had no abnormalities noted for color. The periwound skin appearance exhibited: Callus, Maceration. Periwound temperature was noted as No Abnormality. 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 There was a Selective/Open Wound Skin/Epidermis Debridement with a total area of 9.86 sq cm performed by [name], MD. With the following instrument(s): Curette to remove Non-Viable tissue/material. Material removed includes Callus and Skin: Epidermis and. No specimens were taken. A time out was conducted at 08:45, prior to the start of the procedure. A Minimum amount of bleeding was controlled with Pressure. The procedure was  tolerated well with a pain level of 0 throughout and a pain level of 0 following the procedure. Post Debridement Measurements: 2.9cm length x 3.4cm width x 1.6cm depth; 12.39cm^3 volume. Character of Wound/Ulcer Post Debridement is improved. Severity of Tissue Post Debridement is: Fat layer exposed. Post procedure Diagnosis Wound #3: Same as Pre-Procedure General Notes: Scribed for Dr. [name] by Baruch Gouty, RN. [place], [name] ([id number]) [id number] Page 20 of 22 Pre-procedure diagnosis of Wound #3 is a Diabetic Wound/Ulcer of the Lower Extremity located on the Left,Lateral,Plantar Foot. A skin graft procedure using a bioengineered skin substitute/cellular or tissue based product was performed by [name], MD with the following instrument(s): Blade, Forceps, and Scissors. Apligraf was applied and secured with Steri-Strips. 40 sq cm of product was utilized and 4 sq cm was wasted due to wound size. Post Application, adaptic, VAC was applied. A Time Out was conducted at 08:50, prior to the start of the procedure. The procedure was tolerated well with a pain level of 0 throughout and a pain level of 0 following the procedure. Post procedure Diagnosis Wound #3: Same as Pre-Procedure General Notes: Scribed for Dr. [name] by Baruch Gouty, RN. Plan Follow-up Appointments:  Return Appointment in 1 week. - Dr. [name] - Room 1 with [facility] Wednesday 2/14 @ 0815 am Cellular or Tissue Based Products: Cellular or Tissue Based Product Type: - Apligraf #1 Cellular or Tissue Based Product applied to wound bed, secured with steri-strips, cover with Adaptic or Mepitel. (DO NOT REMOVE). - may change black sponge, leave steristrips in place Bathing/ Shower/ Hygiene: May shower and wash wound with soap and water. Negative Presssure Wound Therapy: Wound #3 Left,Lateral,Plantar Foot: Wound Vac to wound continuously at 127mm/hg pressure Black Foam Edema Control - Lymphedema /  SCD / Other: Avoid standing for long periods of time. Exercise regularly Compression stocking or Garment 20-30 mm/Hg pressure to: - to both legs daily Off-Loading: Open toe surgical shoe to: - Both feet Additional Orders / Instructions: Follow Nutritious Diet Non Wound Condition: Apply the following to affected area as directed: - continue to apply foam donut or cushion to healed right plantar foot WOUND #3: - Foot Wound Laterality: Plantar, Left, Lateral Cleanser: Soap and Water 3 x Per Week/30 Days Discharge Instructions: May shower and wash wound with dial antibacterial soap and water prior to dressing change. Peri-Wound Care: Skin Prep (DME) (Generic) 3 x Per Week/30 Days Discharge Instructions: Use skin prep as directed Prim Dressing: Apligraf 3 x Per Week/30 Days ary Prim Dressing: VAC 3 x Per Week/30 Days ary Secondary Dressing: ADAPTIC TOUCH 3x4.25 in 3 x Per Week/30 Days Discharge Instructions: Apply over primary dressing as directed. Secondary Dressing: Zetuvit Plus 4x8 in (DME) (Dispense As Written) 3 x Per Week/30 Days Discharge Instructions: Apply over primary dressing as directed. Secured With: Elastic Bandage 4 inch (ACE bandage) (DME) (Generic) 3 x Per Week/30 Days Discharge Instructions: Secure with ACE bandage as directed. Secured With: 78M Medipore Public affairs consultant Surgical T 2x10 (in/yd) (Generic) 3 x Per Week/30 Days ape Discharge Instructions: Secure with tape as directed. Com pression Wrap: Kerlix Roll 4.5x3.1 (in/yd) (Generic) 3 x Per Week/30 Days Discharge Instructions: Apply Kerlix and Coban compression as directed. 05/11/2022: The wound is about the same in terms of depth but measured a little bit narrower today. He has built up a rim of moist callus around the edges. I used a curette to debride callus and epidermis from the wound. I then fenestrated the piece of Apligraf to allow for drainage. It was applied to the wound bed and secured with Adaptic and  Steri-Strips. The wound VAC was applied over the top of the Apligraf. We will decrease the suction to 100 mmHg. They will be able to change the sponge on a regular basis so long as they leave the Adaptic and Steri-Strips in situ. He will follow-up in 1 week. Electronic Signature(s) Signed: 05/11/2022 9:22:15 AM By: [name] MD FACS Entered By: [name] on 05/11/2022 09:22:14 -------------------------------------------------------------------------------- HxROS Details Patient Name: Date of Service: [name], [name] 05/11/2022 8:15 A M Medical Record Number: [id number] Patient Account Number: [id number] Date of Birth/Sex: Treating RN: [birth year] (37 y.o. M) Primary Care Provider: [name] Other Clinician: Referring Provider: Treating Provider/Extender: [name] in Treatment: [address] [name] ([id number]) [id number] Page 21 of 22 Information Obtained From Patient Eyes Medical History: Negative for: Cataracts; Glaucoma; Optic Neuritis Ear/Nose/Mouth/Throat Medical History: Negative for: Chronic sinus problems/congestion; Middle ear problems Hematologic/Lymphatic Medical History: Negative for: Anemia; Hemophilia; Human Immunodeficiency Virus; Lymphedema; Sickle Cell Disease Respiratory Medical History: Negative for: Aspiration; Asthma; Chronic Obstructive Pulmonary Disease (COPD); Pneumothorax; Sleep Apnea; Tuberculosis Cardiovascular Medical History: Negative for: Angina; Arrhythmia; Congestive  Heart Failure; Coronary Artery Disease; Deep Vein Thrombosis; Hypertension; Hypotension; Myocardial Infarction; Peripheral Arterial Disease; Peripheral Venous Disease; Phlebitis; Vasculitis Gastrointestinal Medical History: Negative for: Cirrhosis ; Colitis; Crohns; Hepatitis A; Hepatitis B; Hepatitis C Endocrine Medical History: Positive for: Type II Diabetes Negative for: Type I Diabetes Time with diabetes:  8 Treated with: Insulin Blood sugar tested every day: No Immunological Medical History: Negative for: Lupus Erythematosus; Raynauds; Scleroderma Integumentary (Skin) Medical History: Negative for: History of Burn Musculoskeletal Medical History: Negative for: Gout; Rheumatoid Arthritis; Osteoarthritis; Osteomyelitis Neurologic Medical History: Negative for: Dementia; Neuropathy; Quadriplegia; Paraplegia; Seizure Disorder Oncologic Medical History: Negative for: Received Chemotherapy; Received Radiation Psychiatric Medical History: Negative for: Anorexia/bulimia; Confinement Anxiety Immunizations Pneumococcal Vaccine: Received Pneumococcal Vaccination: No [place], [name] ([id number] Page 22 of 22 Implantable Devices None Hospitalization / Surgery History Type of Hospitalization/Surgery 11/1-11/06/2018- sepsis foot infection 11/4-11/5 02 sats low respiratory distress Family and Social History Cancer: No; Diabetes: No; Hereditary Spherocytosis: No; Hypertension: No; Kidney Disease: No; Lung Disease: No; Seizures: No; Stroke: No; Thyroid Problems: No; Tuberculosis: No; Never smoker; Marital Status - Single; Alcohol Use: Rarely; Drug Use: No History; Caffeine Use: Never; Financial Concerns: No; Food, Clothing or Shelter Needs: No; Support System Lacking: No; Transportation Concerns: No Electronic Signature(s) Signed: 05/11/2022 9:51:55 AM By: [name] MD FACS Entered By: [name] on 05/11/2022 09:20:46 -------------------------------------------------------------------------------- SuperBill Details Patient Name: Date of Service: [name], [name] 05/11/2022 Medical Record Number: [id number] Patient Account Number: [id number] Date of Birth/Sex: Treating RN: 1986-02-28 (37 y.o. 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 Modifier: ity Quantity: 1 : CPT4 Code: [id number] Description: [id number] - WOUND VAC-50 SQ CM OR LESS Modifier: Quantity: 1 Physician Procedures : CPT4 Code Description Modifier [id number] - WC PHYS LEVEL 4 - EST PT 25 ICD-10 Diagnosis Description L97.528 Non-pressure chronic ulcer of other part of left foot with other specified severity E11.621 Type 2 diabetes mellitus with foot ulcer Quantity: 1 : [id number] - WC PHYS SKIN SUB GRAFT FACE/NK/HF/G ICD-10 Diagnosis Description L97.528 Non-pressure chronic ulcer of other part of left foot with other specified severity Quantity: 1 Electronic Signature(s) Signed: 05/11/2022 9:22:45 AM By: [name] MD FACS Entered By: [name] on 05/11/2022 09:22:44

## 2022-05-17 NOTE — Telephone Encounter (Signed)
Error

## 2022-05-18 ENCOUNTER — Encounter (HOSPITAL_BASED_OUTPATIENT_CLINIC_OR_DEPARTMENT_OTHER): Payer: BC Managed Care – PPO | Admitting: General Surgery

## 2022-05-18 DIAGNOSIS — E11621 Type 2 diabetes mellitus with foot ulcer: Secondary | ICD-10-CM | POA: Diagnosis not present

## 2022-05-19 NOTE — Progress Notes (Signed)
[name], Max ([id number]) [id number] Page 1 of 8 Visit Report for 05/18/2022 Arrival Information Details Patient Name: Date of Service: [name] 05/18/2022 8:15 A M Medical Record Number: [id number] Patient Account Number: [id number] Date of Birth/Sex: Treating RN: 1985/07/03 (37 y.o. [name], [name] Primary Care Kamiryn Bezanson: [name] Other Clinician: Referring Fernie Grimm: Treating Jazmen Lindenbaum/Extender: [name] in Treatment: 45 Visit Information History Since Last Visit Added or deleted any medications: No Patient Arrived: Ambulatory Any new allergies or adverse reactions: No Arrival Time: 08:20 Had a fall or experienced change in No Accompanied By: fiance activities of daily living that may affect Transfer Assistance: None risk of falls: Patient Identification Verified: Yes Signs or symptoms of abuse/neglect since last visito No Secondary Verification Process Completed: Yes Hospitalized since last visit: No Patient Requires Transmission-Based Precautions: No Implantable device outside of the clinic excluding No Patient Has Alerts: No cellular tissue based products placed in the center since last visit: Has Dressing in Place as Prescribed: Yes Has Compression in Place as Prescribed: Yes Pain Present Now: No Electronic Signature(s) Signed: 05/18/2022 4:48:58 PM By: Baruch Gouty RN, BSN Entered By: Baruch Gouty on 05/18/2022 08:21:23 -------------------------------------------------------------------------------- Encounter Discharge Information Details Patient Name: Date of Service: [name], [name] 05/18/2022 8:15 A M Medical Record Number: [id number] Patient Account Number: [id number] Date of Birth/Sex: Treating RN: 1985-11-06 (37 y.o. [name] Primary Care Mireya Meditz: [name] Other Clinician: Referring Sivan Cuello: Treating Cheila Wickstrom/Extender: [name] in  Treatment: [phone] Encounter Discharge Information Items Post Procedure Vitals Discharge Condition: Stable Temperature (F): 98.1 Ambulatory Status: Ambulatory Pulse (bpm): 96 Discharge Destination: Home Respiratory Rate (breaths/min): 18 Transportation: Private Auto Blood Pressure (mmHg): 136/89 Accompanied By: fiance Schedule Follow-up Appointment: Yes Clinical Summary of Care: Patient Declined Electronic Signature(s) Signed: 05/18/2022 4:48:58 PM By: Baruch Gouty RN, BSN Entered By: Baruch Gouty on 05/18/2022 09:14:45 [place], Max ([id number] Page 2 of 8 -------------------------------------------------------------------------------- Lower Extremity Assessment Details Patient Name: Date of Service: [name] 05/18/2022 8:15 A M Medical Record Number: [id number] Patient Account Number: [id number] Date of Birth/Sex: Treating RN: [birth year] (37 y.o. [name] Primary Care Deforrest Bogle: [name] Other Clinician: Referring Parris Signer: Treating Reine Bristow/Extender: [name] in Treatment: 133 Edema Assessment Assessed: [name]: No] [name]: No] Edema: [Left: Ye] [Right: s] Calf Left: Right: Point of Measurement: 48 cm From Medial Instep 50 cm Ankle Left: Right: Point of Measurement: 11 cm From Medial Instep 30 cm Vascular Assessment Pulses: Dorsalis Pedis Palpable: [Left:Yes] Electronic Signature(s) Signed: 05/18/2022 4:48:58 PM By: Baruch Gouty RN, BSN Entered By: Baruch Gouty on 05/18/2022 08:28:35 -------------------------------------------------------------------------------- Multi Wound Chart Details Patient Name: Date of Service: [name], [name] 05/18/2022 8:15 A M Medical Record Number: [id number] Patient Account Number: [id number] Date of Birth/Sex: Treating RN: Feb 10, 1986 (37 y.o. M) Primary Care Trevonne Nyland: [name] Other Clinician: Referring Maridee Slape: Treating  Eastin Swing/Extender: [name] in Treatment: 133 Vital Signs Height(in): 77 Capillary Blood Glucose(mg/dl): 79 Weight(lbs): 280 Pulse(bpm): 96 Body Mass Index(BMI): 33.2 Blood Pressure(mmHg): 136/89 Temperature(F): 98.1 Respiratory Rate(breaths/min): 18 [3:Photos:] [N/A:N/A] Left, Lateral, Plantar Foot N/A N/A Wound Location: Trauma N/A N/A Wounding Event: Diabetic Wound/Ulcer of the Lower N/A N/A Primary Etiology: Extremity Type II Diabetes N/A N/A Comorbid History: 10/02/2019 N/A N/A Date Acquired: 133 N/A N/A Weeks of Treatment: Open N/A N/A Wound Status: No N/A N/A Wound Recurrence: 2.8x3x1.8 N/A N/A Measurements L x W x [name] 6.597 N/A N/A A (  cm) : rea 11.875 N/A N/A Volume (cm) : -300.10% N/A N/A % Reduction in A rea: -7097.00% N/A N/A % Reduction in Volume: Grade 2 N/A N/A Classification: Medium N/A N/A Exudate A mount: Serosanguineous N/A N/A Exudate Type: red, brown N/A N/A Exudate Color: Yes N/A N/A Foul Odor A Cleansing: fter No N/A N/A Odor A nticipated Due to Product Use: Thickened N/A N/A Wound Margin: Large (67-100%) N/A N/A Granulation A mount: Red N/A N/A Granulation Quality: Small (1-33%) N/A N/A Necrotic A mount: Fat Layer (Subcutaneous Tissue): Yes N/A N/A Exposed Structures: Fascia: No Tendon: No Muscle: No Joint: No Bone: No Small (1-33%) N/A N/A Epithelialization: Debridement - Excisional N/A N/A Debridement: Pre-procedure Verification/Time Out 08:45 N/A N/A Taken: Lidocaine 4% Topical Solution N/A N/A Pain Control: Subcutaneous, Slough N/A N/A Tissue Debrided: Skin/Subcutaneous Tissue N/A N/A Level: 8.4 N/A N/A Debridement A (sq cm): rea Curette N/A N/A Instrument: Minimum N/A N/A Bleeding: Pressure N/A N/A Hemostasis A chieved: 0 N/A N/A Procedural Pain: 0 N/A N/A Post Procedural Pain: Procedure was tolerated well N/A N/A Debridement Treatment Response: 2.8x3x1.8 N/A  N/A Post Debridement Measurements L x W x [name] 11.875 N/A N/A Post Debridement Volume: (cm) Callus: Yes N/A N/A Periwound Skin Texture: Maceration: No N/A N/A Periwound Skin Moisture: No Abnormalities Noted N/A N/A Periwound Skin Color: No Abnormality N/A N/A Temperature: Cellular or Tissue Based Product N/A N/A Procedures Performed: Debridement Negative Pressure Wound Therapy Maintenance (NPWT) Treatment Notes Wound #3 (Foot) Wound Laterality: Plantar, Left, Lateral Cleanser Soap and Water Discharge Instruction: May shower and wash wound with dial antibacterial soap and water prior to dressing change. Peri-Wound Care Skin Prep Discharge Instruction: Use skin prep as directed Topical Primary Dressing Apligraf VAC Secondary Dressing ADAPTIC TOUCH 3x4.25 in Discharge Instruction: Apply over primary dressing as directed. Zetuvit Plus 4x8 in Discharge Instruction: Apply over primary dressing as directed. Secured With [place], Max ([id number]) [id number] Page 4 of 8 Elastic Bandage 4 inch (ACE bandage) Discharge Instruction: Secure with ACE bandage as directed. 70M Medipore Soft Cloth Surgical T 2x10 (in/yd) ape Discharge Instruction: Secure with tape as directed. Compression Wrap Kerlix Roll 4.5x3.1 (in/yd) Discharge Instruction: Apply Kerlix and Coban compression as directed. Compression Stockings Add-Ons Electronic Signature(s) Signed: 05/18/2022 9:27:46 AM By: [name] MD FACS Entered By: [name] on 05/18/2022 09:27:46 -------------------------------------------------------------------------------- Multi-Disciplinary Care Plan Details Patient Name: Date of Service: [name], [name] 05/18/2022 8:15 A M Medical Record Number: [id number] Patient Account Number: [id number] Date of Birth/Sex: Treating RN: 07-14-1985 (37 y.o. [name] Primary Care Jazen Spraggins: [name] Other Clinician: Referring  Allayah Raineri: Treating Avi Kerschner/Extender: [name] in Treatment: Leslie reviewed with physician Active Inactive Nutrition Nursing Diagnoses: Imbalanced nutrition Potential for alteratiion in Nutrition/Potential for imbalanced nutrition Goals: Patient/caregiver agrees to and verbalizes understanding of need to use nutritional supplements and/or vitamins as prescribed Date Initiated: 10/24/2019 Date Inactivated: 04/06/2020 Target Resolution Date: 04/03/2020 Goal Status: Met Patient/caregiver will maintain therapeutic glucose control Date Initiated: 10/24/2019 Target Resolution Date: 06/01/2022 Goal Status: Active Interventions: Assess HgA1c results as ordered upon admission and as needed Assess patient nutrition upon admission and as needed per policy Provide education on elevated blood sugars and impact on wound healing Provide education on nutrition Treatment Activities: Education provided on Nutrition : 02/02/2022 Notes: 11/17/20: Glucose control ongoing issue, target date extended. 01/26/21: Glucose management continues. Wound/Skin Impairment Nursing Diagnoses: Impaired tissue integrity Knowledge deficit related to ulceration/compromised skin integrity Goals: Patient/caregiver will verbalize understanding of  skin care regimen Date Initiated: 10/24/2019 Target Resolution Date: 06/01/2022 [place], Max ([id number]) [phone].pdf Page 5 of 8 Goal Status: Active Ulcer/skin breakdown will have a volume reduction of 30% by week 4 Date Initiated: 10/24/2019 Date Inactivated: 01/16/2020 Target Resolution Date: 01/10/2020 Unmet Reason: no change in Goal Status: Unmet measurements. Interventions: Assess patient/caregiver ability to obtain necessary supplies Assess patient/caregiver ability to perform ulcer/skin care regimen upon admission and as needed Assess ulceration(s) every visit Provide education on ulcer  and skin care Notes: 11/17/20: Wound care regimen continues Electronic Signature(s) Signed: 05/18/2022 4:48:58 PM By: Baruch Gouty RN, BSN Entered By: Baruch Gouty on 05/18/2022 08:38:18 -------------------------------------------------------------------------------- Negative Pressure Wound Therapy Maintenance (NPWT) Details Patient Name: Date of Service: [name] 05/18/2022 8:15 A M Medical Record Number: [id number] Patient Account Number: [id number] Date of Birth/Sex: Treating RN: [birth year] (37 y.o. [name] Primary Care Kalep Full: [name] Other Clinician: Referring Brisia Schuermann: Treating Roselee Tayloe/Extender: [name] in Treatment: 133 NPWT Maintenance Performed for: Wound #3 Left, Lateral, Plantar Foot Performed By: Baruch Gouty, RN Type: VAC System Coverage Size (sq cm): 8.4 Pressure Type: Constant Pressure Setting: 100 mmHG Drain Type: None Primary Contact: Other : apligraf, adaptic Sponge/Dressing Type: Foam, Black Date Initiated: 02/11/2022 Dressing Removed: No Quantity of Sponges/Gauze Removed: 1 Canister Changed: No Canister Exudate Volume: 30 Dressing Reapplied: No Quantity of Sponges/Gauze Inserted: 1 Respones T Treatment: o good Days On NPWT : 97 Post Procedure Diagnosis Same as Pre-procedure Electronic Signature(s) Signed: 05/18/2022 4:48:58 PM By: Baruch Gouty RN, BSN Entered By: Baruch Gouty on 05/18/2022 08:40:44 -------------------------------------------------------------------------------- Pain Assessment Details Patient Name: Date of Service: [name], [name] 05/18/2022 8:15 A M Medical Record Number: [id number] Patient Account Number: [id number] Date of Birth/Sex: Treating RN: Oct 22, 1985 (37 y.o. [name] Primary Care Beyla Loney: [name] Other Clinician: Ostermann, Max ([id number]) [id number] Page 6 of 8 Referring Kayon Dozier: Treating  Zerline Melchior/Extender: [name] in Treatment: 133 Active Problems Location of Pain Severity and Description of Pain Patient Has Paino No Site Locations Rate the pain. Current Pain Level: 0 Pain Management and Medication Current Pain Management: Electronic Signature(s) Signed: 05/18/2022 4:48:58 PM By: Baruch Gouty RN, BSN Entered By: Baruch Gouty on 05/18/2022 [id number] -------------------------------------------------------------------------------- Patient/Caregiver Education Details Patient Name: Date of Service: [name], [name] 2/14/2024andnbsp8:15 A M Medical Record Number: [id number] Patient Account Number: [id number] Date of Birth/Gender: Treating RN: 07/02/1985 (37 y.o. [name] Primary Care Physician: [name] Other Clinician: Referring Physician: Treating Physician/Extender: [name] in Treatment: 133 Education Assessment Education Provided To: Patient Education Topics Provided Elevated Blood Sugar/ Impact on Healing: Methods: Explain/Verbal Responses: Reinforcements needed, State content correctly Offloading: Methods: Explain/Verbal Responses: Reinforcements needed, State content correctly Wound/Skin Impairment: Methods: Explain/Verbal Responses: Reinforcements needed, State content correctly Electronic Signature(s) Signed: 05/18/2022 4:48:58 PM By: Baruch Gouty RN, BSN [place], Max [phone] By: Baruch Gouty RN, BSN [phone].pdf Page 7 of 8 Signed: 05/18/2022 4:48:58 Entered By: Baruch Gouty on 05/18/2022 08:38:54 -------------------------------------------------------------------------------- Wound Assessment Details Patient Name: Date of Service: [name] 05/18/2022 8:15 A M Medical Record Number: [id number] Patient Account Number: [id number] Date of Birth/Sex: Treating RN: 1985/07/04 (37 y.o. [name] Primary Care Trayvion Embleton:  [name] Other Clinician: Referring Deland Slocumb: Treating Majed Pellegrin/Extender: [name] in Treatment: 133 Wound Status Wound Number: 3 Primary Etiology: Diabetic Wound/Ulcer of the Lower Extremity Wound Location: Left, Lateral, Plantar Foot Wound Status: Open Wounding Event: Trauma Comorbid History:  Type II Diabetes Date Acquired: 10/02/2019 Weeks Of Treatment: 133 Clustered Wound: No Photos Wound Measurements Length: (cm) 2.8 Width: (cm) 3 Depth: (cm) 1.8 Area: (cm) 6.597 Volume: (cm) 11.875 % Reduction in Area: -300.1% % Reduction in Volume: -7097% Epithelialization: Small (1-33%) Tunneling: No Undermining: No Wound Description Classification: Grade 2 Wound Margin: Thickened Exudate Amount: Medium Exudate Type: Serosanguineous Exudate Color: red, brown Foul Odor After Cleansing: Yes Due to Product Use: No Slough/Fibrino Yes Wound Bed Granulation Amount: Large (67-100%) Exposed Structure Granulation Quality: Red Fascia Exposed: No Necrotic Amount: Small (1-33%) Fat Layer (Subcutaneous Tissue) Exposed: Yes Necrotic Quality: Adherent Slough Tendon Exposed: No Muscle Exposed: No Joint Exposed: No Bone Exposed: No Periwound Skin Texture Texture Color No Abnormalities Noted: No No Abnormalities Noted: Yes Callus: Yes Temperature / Pain Temperature: No Abnormality Moisture No Abnormalities Noted: No Maceration: No [place]us, Max ([id number] Page 8 of 8 Treatment Notes Wound #3 (Foot) Wound Laterality: Plantar, Left, Lateral Cleanser Soap and Water Discharge Instruction: May shower and wash wound with dial antibacterial soap and water prior to dressing change. Peri-Wound Care Skin Prep Discharge Instruction: Use skin prep as directed Topical Primary Dressing Apligraf VAC Secondary Dressing ADAPTIC TOUCH 3x4.25 in Discharge Instruction: Apply over primary dressing as  directed. Zetuvit Plus 4x8 in Discharge Instruction: Apply over primary dressing as directed. Secured With Elastic Bandage 4 inch (ACE bandage) Discharge Instruction: Secure with ACE bandage as directed. 69M Medipore Soft Cloth Surgical T 2x10 (in/yd) ape Discharge Instruction: Secure with tape as directed. Compression Wrap Kerlix Roll 4.5x3.1 (in/yd) Discharge Instruction: Apply Kerlix and Coban compression as directed. Compression Stockings Add-Ons Electronic Signature(s) Signed: 05/18/2022 4:48:58 PM By: Baruch Gouty RN, BSN Entered By: Baruch Gouty on 05/18/2022 08:36:30 -------------------------------------------------------------------------------- Vitals Details Patient Name: Date of Service: [name], [name] 05/18/2022 8:15 A M Medical Record Number: [id number] Patient Account Number: [id number] Date of Birth/Sex: Treating RN: 21-Nov-1985 (37 y.o. [name] Primary Care Reisha Wos: [name] Other Clinician: Referring Debara Kamphuis: Treating Laraina Sulton/Extender: [name] in Treatment: 133 Vital Signs Time Taken: 08:23 Temperature (F): 98.1 Height (in): 77 Pulse (bpm): 96 Weight (lbs): 280 Respiratory Rate (breaths/min): 18 Body Mass Index (BMI): 33.2 Blood Pressure (mmHg): 136/89 Capillary Blood Glucose (mg/dl): 79 Reference Range: 80 - 120 mg / dl Notes glucose per pt report last night Electronic Signature(s) Signed: 05/18/2022 4:48:58 PM By: Baruch Gouty RN, BSN Entered By: Baruch Gouty on 05/18/2022 08:25:19

## 2022-05-20 NOTE — Progress Notes (Signed)
[name], Max ([id number]) [id number] Page 1 of 23 Visit Report for 05/18/2022 Chief Complaint Document Details Patient Name: Date of Service: [name] 05/18/2022 8:15 A M Medical Record Number: [id number] Patient Account Number: [id number] Date of Birth/Sex: Treating RN: 09/08/1985 (37 y.o. M) Primary Care Provider: Seward [name] Other Clinician: Referring Provider: Treating Provider/Extender: [name] in Treatment: [place] from: Patient Chief Complaint 01/11/2019; patient is here for review of a rather substantial wound over the left fifth plantar metatarsal head extending into the lateral part of his foot 10/24/2019; patient returns to clinic with wounds on his bilateral feet with underlying osteomyelitis biopsy-proven Electronic Signature(s) Signed: 05/18/2022 9:27:56 AM By: Max Maudlin MD FACS Entered By: [name] on 05/18/2022 09:27:55 -------------------------------------------------------------------------------- Cellular or Tissue Based Product Details Patient Name: Date of Service: [name], [name] 05/18/2022 8:15 A M Medical Record Number: [id number] Patient Account Number: [id number] Date of Birth/Sex: Treating RN: 1985/10/15 (37 y.o. [name] Primary Care Provider: Seward [name] Other Clinician: Referring Provider: Treating Provider/Extender: [name] in Treatment: [phone] Cellular or Tissue Based Product Type Wound #3 Left,Lateral,Plantar Foot Applied to: Performed By: Physician Max Maudlin, MD Cellular or Tissue Based Product Type: Apligraf Level of Consciousness (Pre-procedure): Awake and Alert Pre-procedure Verification/Time Out Yes - 08:48 Taken: Location: genitalia / hands / feet / multiple digits Wound Size (sq cm): 8.4 Product Size (sq cm): 44 Waste Size (sq cm): 0 Waste Reason: wound size Amount of Product Applied (sq  cm): 44 Instrument Used: Forceps, Scissors Lot #: [id number] Order #: 2 Expiration Date: 05/21/2022 Fenestrated: Yes Instrument: Blade Reconstituted: Yes Solution Type: saline Solution Amount: 10 ml Lot #: [id number] Solution Expiration Date: 09/01/2024 Secured: Yes Secured With: Steri-Strips Dressing Applied: Yes Primary Dressing: adaptic, VAC Procedural Pain: 0 [name], Max ([id number] Page 2 of 23 Post Procedural Pain: 0 Response to Treatment: Procedure was tolerated well Level of Consciousness (Post- Awake and Alert procedure): Post Procedure Diagnosis Same as Pre-procedure Electronic Signature(s) Signed: 05/18/2022 10:47:57 AM By: Max Maudlin MD FACS Signed: 05/18/2022 4:48:58 PM By: Max Gouty RN, BSN Entered By: [name] on 05/18/2022 08:55:09 -------------------------------------------------------------------------------- Debridement Details Patient Name: Date of Service: [name], [name] 05/18/2022 8:15 A M Medical Record Number: [id number] Patient Account Number: [id number] Date of Birth/Sex: Treating RN: June 20, 1985 (37 y.o. [name] Primary Care Provider: Seward [name] Other Clinician: Referring Provider: Treating Provider/Extender: [name] in Treatment: 133 Debridement Performed for Assessment: Wound #3 Left,Lateral,Plantar Foot Performed By: Physician Max Maudlin, MD Debridement Type: Debridement Severity of Tissue Pre Debridement: Fat layer exposed Level of Consciousness (Pre-procedure): Awake and Alert Pre-procedure Verification/Time Out Yes - 08:45 Taken: Start Time: 08:45 Pain Control: Lidocaine 4% T opical Solution T Area Debrided (L x W): otal 2.8 (cm) x 3 (cm) = 8.4 (cm) Tissue and other material debrided: Viable, Non-Viable, Slough, Subcutaneous, Slough Level: Skin/Subcutaneous Tissue Debridement Description: Excisional Instrument:  Curette Bleeding: Minimum Hemostasis Achieved: Pressure Procedural Pain: 0 Post Procedural Pain: 0 Response to Treatment: Procedure was tolerated well Level of Consciousness (Post- Awake and Alert procedure): Post Debridement Measurements of Total Wound Length: (cm) 2.8 Width: (cm) 3 Depth: (cm) 1.8 Volume: (cm) 11.875 Character of Wound/Ulcer Post Debridement: Improved Severity of Tissue Post Debridement: Fat layer exposed Post Procedure Diagnosis Same as Pre-procedure Notes Scribed for Dr. Celine [name] by Max Gouty, RN Electronic Signature(s) Signed: 05/18/2022 10:47:57 AM By: Max Maudlin MD FACS Signed: 05/18/2022 4:48:58 PM  By: Max Gouty RN, BSN Entered By: [name] on 05/18/2022 08:48:03 [name], Max ([id number] Page 3 of 23 -------------------------------------------------------------------------------- HPI Details Patient Name: Date of Service: [name] 05/18/2022 8:15 A M Medical Record Number: [id number] Patient Account Number: [id number] Date of Birth/Sex: Treating RN: Aug 25, 1985 (37 y.o. 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 He comes in for review of a wound over the plantar fifth metatarsal head extending into the lateral part of the foot. He was followed for this previously by his podiatrist Dr. Cornelius [name]. As the patient tells his story he went to see podiatry first for a swelling he developed on the lateral part of his fifth metatarsal head in May. He states this was "open" by podiatry and the area closed. He was followed up in June and it was again opened callus removed and it closed promptly. There were plans being made for  surgery on the fifth metatarsal head in June however his blood sugar was apparently too high for anesthesia. Apparently the area was debrided and opened again in June and it is never closed since. Looking over the records from podiatry I am really not able to follow this. It was clear when he was first seen it was before 5/14 at that point he already had a wound. By 5/17 the ulcer was resolved. I do not see anything about a procedure. On 5/28 noted to have pre-ulcerative moderate keratosis. X-ray noted 1/5 contracted toe and tailor's bunion and metatarsal deformity. On a visit date on 09/28/2018 the dorsal part of the left foot it healed and resolved. There was concern about swelling in his lower extremity he was sent to the ER.. As far as I can tell he was seen in the ER on 7/12 with an ulcer on his left foot. A DVT rule out of the left leg was negative. I do not think I have complete records from podiatry but I am not able to verify the procedures this patient states he had. He states after the last procedure the wound has never closed although I am not able to follow this in the records I have from podiatry. He has not had a recent x-ray The patient has been using Neosporin on the wound. He is wearing a Darco shoe. He is still very active up on his foot working and exercising. Past medical history; type 2 diabetes ketosis-prone, leg swelling with a negative DVT study in July. Non-smoker ABI in our clinic was 0.85 on the left 10/16; substantial wound on the plantar left fifth met head extending laterally almost to the dorsal fifth MTP. We have been using silver alginate we gave him a Darco forefoot off loader. An x-ray did not show evidence of osteomyelitis did note soft tissue emphysema which I think was due to gas tracking through an open wound. There is no doubt in my mind he requires an MRI 10/23; MRI not booked until 3 November at the earliest this is largely due to his glucose sensor in the  right arm. We have been using silver alginate. There has been an improvement 10/29; I am still not exactly sure when his MRI is booked for. He says it is the third but it is the 10th in epic. This definitely needs to be done.  He is running a low-grade fever today but no other symptoms. No real improvement in the 1 02/26/2019 patient presents today for a follow-up visit here in our clinic he is last been seen in the clinic on October 29. Subsequently we were working on getting MRI to evaluate and see what exactly was going on and where we would need to go from the standpoint of whether or not he had osteomyelitis and again what treatments were going be required. Subsequently the patient ended up being admitted to the hospital on 02/07/2019 and was discharged on 02/14/2019. This is a somewhat interesting admission with a discharge diagnosis of pneumonia due to COVID-19 although he was positive for COVID-19 when tested at the urgent care but negative x2 when he was actually in the hospital. With that being said he did have acute respiratory failure with hypoxia and it was noted he also have a left foot ulceration with osteomyelitis. With that being said he did require oxygen for his pneumonia and I level 4 L. He was placed on antivirals and steroids for the COVID-19. He was also transferred to the [facility] at one point. Nonetheless he did subsequently discharged home and since being home has done much better in that regard. The CT angiogram did not show any pulmonary embolism. With regard to the osteomyelitis the patient was placed on vancomycin and Zosyn while in the hospital but has been changed to Augmentin at discharge. It was also recommended that he follow- up with wound care and podiatry. Podiatry however wanted him to see [name] according to the patient prior to them doing anything further. His hemoglobin A1c was 9.9 as noted in the hospital. Have an MRI of the left foot performed while in  the hospital on 02/04/2019. This showed evidence of septic arthritis at the fifth MTP joint and osteomyelitis involving the fifth metatarsal head and proximal phalanx. There is an overlying plantar open wound noted an abscess tracking back along the lateral aspect of the fifth metatarsal shaft. There is otherwise diffuse cellulitis and mild fasciitis without findings of polymyositis. The patient did have recently pneumonia secondary to COVID-19 I looked in the chart through epic and it does appear that the patient may need to have an additional x-ray just to ensure everything is cleared and that he has no airspace disease prior to putting him into the [name]. 03/05/2019; patient was readmitted to the clinic last week. He was hospitalized twice for a viral upper respiratory tract infection from 11/1 through 11/4 and then 11/5 through 11/12 ultimately this turned out to be Covid pneumonitis. Although he was discharged on oxygen he is not using it. He says he feels fine. He has no exercise limitation no cough no sputum. His O2 sat in our clinic today was 100% on room air. He did manage to have his MRI which showed septic arthritis at the fifth MTP joint and osteomyelitis involving the fifth metatarsal head and proximal phalanx. He received Vanco and Zosyn in the hospital and then was discharged on 2 weeks of Augmentin. I do not see any relevant cultures. He was supposed to follow-up with infectious disease but I do not see that he has an appointment. 12/8; patient saw Dr. Novella Olive of infectious disease last week. He felt that he had had adequate antibiotic therapy. He did not go to follow-up with Dr. Amalia Hailey of podiatry and I have again talked to him about the pros and cons of this. He does not want to consider  a ray amputation of this time. He is aware of the risks of recurrence, migration etc. He started HBO today and tolerated this well. He can complete the Augmentin that I gave him last week. I have looked  over the lab work that Dr. Chana Bode ordered his C-reactive protein was 3.3 and his sedimentation rate was 17. The C-reactive protein is never really been measurably that high in this patient 12/15; not much change in the wound today however he has undermining along the lateral part of the foot again more extensively than last week. He has some rims of epithelialization. We have been using silver alginate. He is undergoing hyperbarics but did not dive today 12/18; in for his obligatory first total contact cast change. Unfortunately there was pus coming from the undermining area around his fifth metatarsal head. This was cultured but will preclude reapplication of a cast. He is seen in conjunction with HBO 12/24; patient had staph lugdunensis in the wound in the undermining area laterally last time. We put him on doxycycline which should have covered this. The wound looks better today. I am going to give him another week of doxycycline before reattempting the total contact cast 12/31; the patient is completing antibiotics. Hemorrhagic debris in the distal part of the wound with some undermining distally. He also had hyper granulation. Extensive debridement with a #5 curette. The infected area that was on the lateral part of the fifth met head is closed over. I do not think he needs any more antibiotics. Patient was seen prior to HBO. Preparations for a total contact cast were made in the cast will be placed post hyperbarics 04/11/19; once again the patient arrives today without complaint. He had been in a cast all week noted that he had heavy drainage this week. This resulted in large raised areas of macerated tissue around the wound 1/14; wound bed looks better slightly smaller. Hydrofera Blue has been changing himself. He had a heavy drainage last week which caused a lot of maceration Lecker, Max ([id number]) [phone].pdf Page 4 of 23 around the wound so I took him out of a  total contact cast he says the drainage is actually better this week He is seen today in conjunction with HBO 1/21; returns to clinic. He was up in [place] for a day or 2 attending a funeral. He comes back in with the wound larger and with a large area of exposed bone. He had osteomyelitis and septic arthritis of the fifth left metatarsal head while he was in hospital. He received IV antibiotics in the hospital for a prolonged period of time then 3 weeks of Augmentin. Subsequently I gave him 2 weeks of doxycycline for more superficial wound infection. When I saw this last week the wound was smaller the surface of the wound looks satisfactory. 1/28; patient missed hyperbarics today. Bone biopsy I did last time showed Enterococcus faecalis and Staphylococcus lugdunensis . He has a wide area of exposed bone. We are going to use silver alginate as of today. I had another ethical discussion with the patient. This would be recurrent osteomyelitis he is already received IV antibiotics. In this situation I think the likelihood of healing this is low. Therefore I have recommended a ray amputation and with the patient's agreement I have referred him to Dr. Doran Durand. The other issue is that his compliance with hyperbarics has been minimal because of his work schedule and given his underlying decision I am going to stop this today READMISSION 10/24/2019 MRI  09/29/2019 left foot IMPRESSION: 1. Apparent skin ulceration inferior and lateral to the 5th metatarsal base with underlying heterogeneous T2 signal and enhancement in the subcutaneous fat. Small peripherally enhancing fluid collections along the plantar and lateral aspects of the 5th metatarsal base suspicious for abscesses. 2. Interval amputation through the mid 5th metatarsal with nonspecific low-level marrow edema and enhancement. Given the proximity to the adjacent soft tissue inflammatory changes, osteomyelitis cannot be excluded. 3. The  additional bones appear unremarkable. MRI 09/29/2019 right foot IMPRESSION: 1. Soft tissue ulceration lateral to the 5th MTP joint. There is low-level T2 hyperintensity within the 4th and 5th metatarsal heads and adjacent proximal phalanges without abnormal T1 signal or cortical destruction. These findings are nonspecific and could be seen with early marrow edema, hyperemia or early osteomyelitis. No evidence of septic joint. 2. Mild tenosynovitis and synovial enhancement associated with the extensor digitorum tendons at the level of the midfoot. 3. Diffuse low-level muscular T2 hyperintensity and enhancement, most consistent with diabetic myopathy. LEFT FOOT BONE Methicillin resistant staphylococcus aureus Staphylococcus lugdunensis MIC MIC CIPROFLOXACIN >=8 RESISTANT Resistant <=0.5 SENSI... Sensitive CLINDAMYCIN <=0.25 SENS... Sensitive >=8 RESISTANT Resistant ERYTHROMYCIN >=8 RESISTANT Resistant >=8 RESISTANT Resistant GENTAMICIN <=0.5 SENSI... Sensitive <=0.5 SENSI... Sensitive Inducible Clindamycin NEGATIVE Sensitive NEGATIVE Sensitive OXACILLIN >=4 RESISTANT Resistant 2 SENSITIVE Sensitive RIFAMPIN <=0.5 SENSI... Sensitive <=0.5 SENSI... Sensitive TETRACYCLINE <=1 SENSITIVE Sensitive <=1 SENSITIVE Sensitive TRIMETH/SULFA <=10 SENSIT Sensitive <=10 SENSIT Sensitive ... Marland Kitchen.. VANCOMYCIN 1 SENSITIVE Sensitive <=0.5 SENSI... Sensitive Right foot bone . Component 3 wk ago Specimen Description BONE Special Requests RIGHT 4 METATARSAL SAMPLE B Gram Stain NO WBC SEEN NO ORGANISMS SEEN Culture RARE METHICILLIN RESISTANT STAPHYLOCOCCUS AUREUS NO ANAEROBES ISOLATED Performed at [facility], [place] [address], [place], [place] [address] Report Status 10/08/2019 FINAL Organism ID, Bacteria METHICILLIN RESISTANT STAPHYLOCOCCUS AUREUS Resulting Agency [facility] Susceptibility Methicillin resistant staphylococcus aureus MIC CIPROFLOXACIN >=8 RESISTANT Resistant CLINDAMYCIN  <=0.25 SENS... Sensitive ERYTHROMYCIN >=8 RESISTANT Resistant GENTAMICIN <=0.5 SENSI... Sensitive Inducible Clindamycin NEGATIVE Sensitive OXACILLIN >=4 RESISTANT Resistant RIFAMPIN <=0.5 SENSI... Sensitive TETRACYCLINE <=1 SENSITIVE Sensitive TRIMETH/SULFA <=10 SENSIT Sensitive ... VANCOMYCIN 1 SENSITIVE Sensitive This is a patient we had in clinic earlier this year with a wound over his left fifth metatarsal head. 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 Doran Durand on 05/16/2019. He seems to have developed problems with wounds on his bilateral feet in June 2021 although he may have had problems earlier than this. He was in an urgent care with a right foot ulcer on 09/26/2019 and given a course of doxycycline. This was apparently after having trouble getting into see orthopedics. He was seen by podiatry on 09/28/2019 noted to have bilateral lower extremity ulcers including the left lateral fifth metatarsal base and the right subfifth met head. It was noted that had purulent drainage at that time. He required hospitalization from 6/20 through 7/2. This was because of worsening right foot wounds. He underwent bilateral operative incision and drainage and bone biopsies bilaterally. Culture results are listed above. He has been referred back to clinic by Dr. Jacqualyn Posey of podiatry. He is also followed by Dr. Megan Salon who saw him yesterday. He was discharged from hospital on Zyvox Flagyl  and Levaquin and yesterday changed to doxycycline Flagyl and Levaquin. His inflammatory markers on 6/26 showed a sedimentation rate of 129 and a C-reactive protein  of 5. This is improved to 14 and 1.3 respectively. This would indicate improvement. ABIs in our clinic today were 1.23 on the right and 1.20 on the left 11/01/2019 on evaluation today patient appears to be doing fairly well in regard to the wounds on his feet at this point. Fortunately there is no signs of active infection at this time. No fevers, chills, nausea, vomiting, or diarrhea. He currently is seeing infectious disease and still under their care at this point. Subsequently he also has both wounds which she has not been using collagen on as he did not receive that in his packaging he did not call us and let us know that. Apparently that just was missed on the order. Nonetheless we will get that straightened out today. 8/9-Patient returns for bilateral foot wounds, using Prisma with hydrogel moistened dressings, and the wounds appear stable. Patient using surgical shoes, avoiding much pressure or weightbearing as much as possible 8/16; patient has bilateral foot wounds. 1 on the right lateral foot proximally the other is on the left mid lateral foot. Both required debridement of callus and thick skin around the wounds. We have been using silver collagen 8/27; patient has bilateral lateral foot wounds. The area on the left substantially surrounded by callus and dry skin. This was removed from the wound edge. The underlying wound is small. The area on the right measured somewhat smaller today. 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 Unfortunately I did not update his antibiotics during today's visit. 9/10 I reviewed Dr. Hale Bogus last notes he felt he had completed antibiotics his inflammatory markers were reasonably well controlled. He has a small wound on the lateral left foot and a tiny area on the right which is just above closed. He is using Hydrofera Blue with border foam he has bilateral surgical shoes 9/24; 2 week f/u.  doing well. right foot is closed. left foot still undermined. 10/14; right foot remains closed at the fifth met head. The area over the base of the left fifth metatarsal has a small open area but considerable undermining towards the plantar foot. Thick callus skin around this suggests an adequate pressure relief. We have talked about this. He says he is going to go back into his cam boot. I suggested a total contact cast he did not seem enamored with this suggestion 10/26; left foot base of the fifth metatarsal. Same condition as last time. He has skin over the area with an open wound however the skin is not adherent. He went to see Dr. Earleen Newport who did an x-ray and culture of his foot I have not reviewed the x-ray but the patient was not told anything. He is on doxycycline 11/11; since the patient was last here he was in the emergency room on 10/30 he was concerned about swelling in the left foot. They did not do any cultures or x-rays. They changed his antibiotics to cephalexin. Previous culture showed group B strep. The cephalexin is appropriate as doxycycline has less than predictable coverage. Arrives in clinic today with swelling over this area under the wound. He also has a new wound on the right fifth metatarsal head 11/18; the patient has a difficult wound on the lateral aspect of the left fifth metatarsal head. The wound was almost ballotable last  week I opened it slightly expecting to see purulence however there was just bleeding. I cultured this this was negative. X-ray unchanged. We are trying to get an MRI but I am not sure were going to be able to get this through his insurance. He also has an area on the right lateral fifth metatarsal head this looks healthier 12/3; the patient finally got our MRI. Surprisingly this did not show osteomyelitis. I did show the soft tissue ulceration at the lateral plantar aspect of the fifth metatarsal base with a tiny residual 6 mm abscess overlying the  superficial fascia I have tried to culture this area I have not been able to get this to grow anything. Nevertheless the protruding tissue looks aggravated. I suspect we should try to treat the underlying "abscess with broad-spectrum antibiotics. I am going to start him on Levaquin and Flagyl. He has much less edema in his legs and I am going to continue to wrap his legs and see him weekly 12/10. I started Levaquin and Flagyl on him last week. He just picked up the Flagyl apparently there was some delay. The worry is the wound on the left fifth metatarsal base which is substantial and worsening. His foot looks like he inverts at the ankle making this a weightbearing surface. Certainly no improvement in fact I think the measurements of this are somewhat worse. We have been using 12/17; he apparently just got the Levaquin yesterday this is 2 weeks after the fact. He has completed the Flagyl. The area over the left fifth metatarsal base still has protruding granulation tissue although it does not look quite as bad as it did some weeks ago. He has severe bilateral lymphedema although we have not been treating him for wounds on his legs this is definitely going to require compression. There was so much edema in the left I did not wish to put him in a total contact cast today. I am going to increase his compression from 3-4 layer. The area on the right lateral fifth met head actually look quite good and superficial. 12/23; patient arrived with callus on the right fifth met head and the substantial hyper granulated callused wound on the base of his fifth metatarsal. He says he is completing his Levaquin in 2 days but I do not think that adds up with what I gave him but I will have to double check this. We are using Hydrofera Blue on both areas. My plan is to put the left leg in a cast the week after New Year's 04/06/2020; patient's wounds about the same. Right lateral fifth metatarsal head and left lateral  foot over the base of the fifth metatarsal. There is undermining on the left lateral foot which I removed before application of total contact cast continuing with Hydrofera Blue new. Patient tells me he was seen by endocrinology today lab work was done [Dr. Kerr]. Also wondering whether he was referred to cardiology. I went over some lab work from previously does not have chronic renal failure certainly not nephrotic range proteinuria he does have very poorly controlled diabetes but this is not his most updated lab work. Hemoglobin A1c has been over 11 1/10; the patient had a considerable amount of leakage towards mid part of his left foot with macerated skin however the wound surface looks better the area on the right lateral fifth met head is better as well. I am going to change the dressing on the left foot under the total contact cast  to silver alginate, continue with Hydrofera Blue on the right. 1/20; patient was in the total contact cast for 10 days. Considerable amount of drainage although the skin around the wound does not look too bad on the left foot. The area on the right fifth metatarsal head is closed. Our nursing staff reports large amount of drainage out of the left lateral foot wound 1/25; continues with copious amounts of drainage described by our intake staff. PCR culture I did last week showed E. coli and Enterococcus faecalis and low quantities. Multiple resistance genes documented including extended spectrum beta lactamase, MRSA, MRSE, quinolone, tetracycline. The wound is not quite as good this week as it was 5 days ago but about the same size 2/3; continues with copious amounts of malodorous drainage per our intake nurse. The PCR culture I did 2 weeks ago showed E. coli and low quantities of Enterococcus. There were multiple resistance genes detected. I put Neosporin on him last week although this does not seem to have helped. The wound is slightly deeper today. Offloading  continues to be an issue here although with the amount of drainage she has a total contact cast is just not going to work 2/10; moderate amount of drainage. Patient reports he cannot get his stocking on over the dressing. I told him we have to do that the nurse gave him suggestions on how to make this work. The wound is on the bottom and lateral part of his left foot. Is cultured predominantly grew low amounts of Enterococcus, E. coli and anaerobes. There were multiple resistance genes detected including extended spectrum beta lactamase, quinolone, tetracycline. I could not think of an easy oral combination to address this so for now I am going to do topical antibiotics provided by [facility] I think the main agents here are vancomycin and an aminoglycoside. We have to be able to give him access to the wounds to get the topical antibiotic on 2/17; moderate amount of drainage this is unchanged. He has his Keystone topical antibiotic against the deep tissue culture organisms. He has been using this and changing the dressing daily. Silver alginate on the wound surface. 2/24; using Keystone antibiotic with silver alginate on the top. He had too much drainage for a total contact cast at one point although I think that is improving and I think in the next week or 2 it might be possible to replace a total contact cast I did not do this today. In general the wound surface looks healthy however he continues to have thick rims of skin and subcutaneous tissue around the wide area of the circumference which I debrided 06/04/2020 upon evaluation today patient appears to be doing well in regard to his wound. I do feel like he is showing signs of improvement. There is little bit of callus and dead tissue around the edges of the wound as well as what appears to be a little bit of a sinus tract that is off to the side laterally I would perform debridement to clear that away today. 3/17; left lateral foot. The wound looks  about the same as I remember. Not much depth surface looks healthy. No evidence of infection 3/25; left lateral foot. Wound surface looks about the same. Separating epithelium from the circumference. There really is no evidence of infection here however not making progress by my view [place], Max ([id number]) [id number] Page 6 of 23 3/29; left lateral foot. Surface of the wound again looks reasonably healthy still thick skin and  subcutaneous tissue around the wound margins. There is no evidence of infection. One of the concerns being brought up by the nurses has again the amount of drainage vis--vis continued use of a total contact cast 4/5; left lateral foot at roughly the base of the fifth metatarsal. Nice healthy looking granulated tissue with rims of epithelialization. The overall wound measurements are not any better but the tissue looks healthy. The only concern is the amount of drainage although he has no surrounding maceration with what we have been doing recently to absorb fluid and protect his skin. He also has lymphedema. 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 He states that the kerlix/coban under the TCC rolled down and could not get it back up. He has been using an offloading scooter and has somehow hurt his right foot using this device. This happened last week. He states that the side of his right foot developed a blister and opened. The top of his foot also has a few small open wounds he thinks is due to his socks rubbing in his shoes. He has not been using any dressings to the wound. He denies purulent drainage, fever/chills or erythema to the wounds. 4/22; patient presents for 1 week follow-up. He  developed new wounds to the right foot that were evaluated at last clinic visit. He continues to have a total contact cast to the left leg and he reports no issues. He has been using silver collagen to the right foot wounds with no issues. He denies purulent drainage, fever/chills or erythema to the right foot wounds. He has no complaints today 4/25; patient presents for 1 week follow-up. He has a total contact cast of the left leg and reports no issues. He has been using silver alginate to the right foot wound. He denies purulent drainage, fever/chills or erythema to the right foot wounds. 5/2 patient presents for 1 week follow-up. T contact cast on the left. The wound which is on the base of the plantar foot at the base of the fifth metatarsal otal actually looks quite good and dimensions continue to gradually contract. HOWEVER the area on the right lateral fifth metatarsal head is much larger than what I remember from 2 weeks ago. Once more is he has significant levels of hypergranulation. Noteworthy that he had this same hyper granulated response on his wound on the left foot at one point in time. So much so that he I thought there was an underlying fluid collection. Based on this I think this just needs debridement. 5/9; the wound on the left actually continues to be gradually smaller with a healthy surface. Slight amount of drainage and maceration of the skin around but not too bad. However he has a large wound over the right fifth metatarsal head very much in the same configuration as his left foot wound was initially. I used silver nitrate to address the hyper granulated tissue no mechanical debridement 5/16; area on the left foot did not look as healthy this week deeper thick surrounding macerated skin and subcutaneous tissue. The area on the right foot fifth met head was about the same The area on the right ankle that we identified last week is completely broken down into an open  wound  presumably a stocking rubbing issue 5/23; patient has been using a total contact cast to the left side. He has been using silver alginate underneath. He has also been using silver alginate to the right foot wounds. He has no complaints today. He denies any signs of infection. 5/31; the left-sided wound looks some better measure smaller surface granulation looks better. We have been using silver alginate under the total contact cast The large area on his right fifth met head and right dorsal foot look about the same still using silver alginate 6/6; neither side is good as I was hoping although the surface area dimensions are better. A lot of maceration on his left and right foot around the wound edge. Area on the dorsal right foot looks better. He says he was traveling. I am not sure what does the amount of maceration around the plantar wounds may be drainage issues 6/13; in general the wound surfaces look quite good on both sides. Macerated skin and raised edges around the wound required debridement although in general especially on the left the surface area seems improved. The area on the right dorsal ankle is about the same I thought this would not be such a problem to close 6/20; not much change in either wound although the one on the right looks a little better. Both wounds have thick macerated edges to the skin requiring debridements. We have been using silver alginate. The area on the dorsal right ankle is still open I thought this would be closed. 6/28; patient comes in today with a marked deterioration in the right foot wound fifth met head. Wide area of exposed bone this is a drastic change from last time. The area on the left there we have been casting is stagnant. We have been using silver alginate in both wound areas. 7/5; bone culture I did for PCR last time was positive for Pseudomonas, group B strep, Enterococcus and Staph aureus. There was no suggestion of methicillin resistance or  ampicillin resistant genes. This was resistant to tetracycline however He comes into the clinic today with the area over his right plantar fifth metatarsal head which had been doing so well 2 weeks ago completely necrotic feeling bone. I do not know that this is going to be salvageable. The left foot wound is certainly no smaller but it has a better surface and is superficial. 7/8; patient called in this morning to say that his total contact cast was rubbing against his foot. He states he is doing fine overall. He denies signs of infection. 7/12; continued deterioration in the wound over the right fifth metatarsal head crumbling bone. This is not going to be salvageable. The patient agrees and wants to be referred to Dr. Doran Durand which we will attempt to arrange as soon as possible. I am going to continue him on antibiotics as long as that takes so I will renew those today. The area on the left foot which is the base of the fifth metatarsal continues to look somewhat better. Healthy looking tissue no depth no debridement is necessary here. 7/20; the patient was kindly seen by Dr. Doran Durand of orthopedics on 10/19/2020. 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 Towards this end we have taken him out of the total contact cast on the left we will put him in a wrap with Hydrofera Blue. As I understand things  surgery is planned for 7/21 7/27; patient had his surgery last Thursday. 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 Apparently everything went well we did not still disturb that today The area on the left foot actually looks quite good. He has been much less mobile which probably explains this he did not seem to do well in the total contact cast secondary to drainage and maceration I think. 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 Fortunately there is no evidence of active infection at this time. No fevers, chills, nausea, vomiting, or diarrhea. Overall I think that he is actually doing extremely well. Nonetheless I do believe that he is staying off of this more following the surgery in his right foot that is the reason the left is doing so great. 8/16; left plantar foot wound. This looks smaller than the last time I saw this he is using Hydrofera Blue. The surgical wound on the right foot is being followed by Dr. Doran Durand we did not look at this today. He has surgical shoes on both feet 8/23; left plantar foot wound not as good this week. Surrounding macerated skin and subcutaneous tissue everything looks moist and wet. I do not think he is offloading this adequately. He is using a surgical shoe Apparently the right foot surgical wound is not open although I did not check his foot 8/31; left plantar foot lateral aspect. Much improved this week. He has no maceration. Some improvement in the surface area of the wound but most impressively the depth is come in we are using silver alginate. The patient is a Product/process development scientist. He is asked that we write him a letter so he can go back to work. I have also tried to see if we can write something that will allow him to limit the amount of time that he is on his foot at work. 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 Hopefully the latter can be avoided 9/6; I believe the patient missed an appointment last week. He arrives in today with a wound looking roughly the same certainly no better. Undermining laterally [place], Max ([id number]) [id number] Page 7 of 23 and also inferiorly. We used molecuLight today in training with the patient's permission.. We are using silver alginate 9/21 wound is measuring bigger this week although this may have to do  with the aggressive circumferential debridement last week in response to the blush fluorescence on the MolecuLight. Culture I did last week showed significant MSSA and E. coli. I put him on Augmentin but he has not started it yet. We are also going to send this for compounded antibiotics at [facility]. There is no evidence of systemic infection 9/29; silver alginate. His Keystone arrived. He is completing Augmentin in 2 days. Offloading in a cam boot. Moderate drainage per our intake staff 10/5; using silver alginate. He has been using his Normal. He has completed his Augmentin. Per our intake nurse still a lot of drainage, far too much to consider a total contact cast. Wound measures about the same. He had the same undermining area that I defined last week from a roughly 11-3. I remove this today 10/12; using silver alginate he is using the [facility]. He comes in for a nurse visit hence we are applying [employer] twice a week. Measuring  slightly better today and less notable drainage. Extensive debridement of the wound edge last time 10/18; using topical Keystone and silver alginate and a soft cast. Wound measurements about the same. Drainage was through his soft cast. We are changing this twice a week Tuesdays and Friday 10/25; comes in with moderate drainage. Still using Keystone silver alginate and a soft cast. Wound dimensions completely the same.He has a lot of edema in the left leg he has lymphedema. Asking for [name] to consider wrapping him as he cannot get his stocking on over the soft cast 11/2; comes in with moderate to large drainage slightly smaller in terms of width we have been using [place]. His wound looks satisfactory but not much improvement 11/4; patient presents today for obligatory cast change. Has no issues or complaints today. He denies signs of infection. 11/9; patient traveled this weekend to [place], was on the cast quite a bit. Staining of the cast with black material from his  walking boot. Drainage was not quite as bad as we feared. Using silver alginate and Keystone 11/16; we do not have size for cast therefore we have been putting a soft cast on him since the change on Friday. Still a significant amount of drainage necessitating changing twice a week. We have been using the Keystone at cast changes either hard or soft as well as silver alginate Comes in the clinic with things actually looking fairly good improvement in width. He says his offloading is about the same 02/24/2021 upon evaluation today patient actually comes back in and is doing excellent in regard to his foot ulcer this is significantly smaller even compared to the last visit. The soft cast seems to have done extremely well for him which is great news. I do not see any signs of infection minimal debridement will be needed today. 11/30; left lateral foot much improved half a centimeter improvement in surface area. No evidence of infection. He seems to be doing better with the soft cast in the TCC therefore we will continue with this. He comes back in later in the week for a change with the nurses. This is due to drainage 12/6; no improvement in dimensions. Under illumination some debris on the surface we have been using silver alginate, soft cast. If there is anything optimistic here he seems to have have less drainage 12/13. Dimensions are improved both length and width and slightly in depth. Appears to be quite healthy today. Raised edges of this thick skin and callus around the edges however. He is in a soft cast were bringing him back once for a change on Friday. Drainage is better 12/20. Dimensions are improved. He still has raised edges of thick skin and callus around the edges. We are using a soft cast 12/28; comes in today with thick callus around the wound. Using silver under alginate under a soft cast. I do not think there is much improvement in any measurement 2023 04/06/2021; patient was put in  a total contact cast. Unfortunately not much change in surface area 1/10; not much different still thick callus and skin around the edge in spite of the total contact cast. This was just debrided last week we have been using the [facility] compounded antibiotic and silver alginate under a total contact cast 1/18 the patient's wound on the left side is doing nicely. smaller HOWEVER he comes in today with a wound on the right foot laterally. blister most likely serosangquenous drainage 1/24; the patient continues to do well in  terms of the plantar left foot which is continued to contract using silver alginate under the total contact cast HOWEVER the right lateral foot is bigger with denuded skin around the edges. I used pickups and a #15 scalpel to remove this this looks like the remanence of a large blister. Cannot rule out infection. Culture in this area I did last week showed Staphylococcus lugdunensis few colonies. I am going to try to address this with his [employer] antibiotic that is done so well on the left having linezolid and this should cover the staph 2/1; the patient's wound on his left foot which was the original plantar foot wound thick skin and eschar around the edges even in the total contact cast but the wound surface does not look too bad The real problem is on how his right lateral foot at roughly the base of the fifth metatarsal. The wound is completely necrotic more worrisome than that there is swelling around the edges of this. We have been using silver alginate on both wounds and Keystone on the right foot. Unfortunately I think he is going to require systemic antibiotics while we await cultures. He did not get the x-ray done that we ordered last week [lost the prescription 2/7; disappointingly in the area on the left foot which we are treating with a total contact cast is still not closed although it is much smaller. He continues to have a lot of callus around the wound  edge. -Right lateral foot culture I did last week was negative x-ray also negative for osteomyelitis. 2/15: TCC silver alginate on the left and silver alginate on the right lateral. No real improvement in either area 05/26/2021: T oday, the wounds are roughly the same size as at his previous visit, post-debridement. He continues to endorse fairly substantial drainage, particularly on the right. He has been in a total contact cast on the left. There is still some callus surrounding this lesion. On the right, the periwound skin is quite macerated, along with surrounding callus. The center of the right-sided wound also has some dark, densely adherent material, which is very difficult to remove. 06/02/2021: Today, both wounds are slightly smaller. He has been using zinc oxide ointment around the right ulcer and the degree of maceration has improved markedly. There continues to be an area of nonviable tissue in the center of the right sided ulcer. The left-sided wound, which has been in the total contact cast. Appears clean and the degree of callus around it is less than previously. 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 He had to take the stairs and both wounds have increased in size. The left foot, which has been in a total contact cast, has developed a tunnel tracking to the lateral aspect of his foot. The nonviable tissue in the center of the right-sided ulcer remains recalcitrant to debridement. There is significant undermining surrounding the entirety of the left sided wound. 06/16/2021: The elevator at school has been fixed and the patient has been able to avoid putting as much weight on his wounds over the past week. We converted the left foot wound into a single lesion today, but despite this, the wound is actually smaller. The base is healthy with limited periwound callus. On the right, the central necrotic area is still present. He  continues to be quite macerated around the right sided wound, despite applying barrier cream. This does, however, have the benefit of softening the  callus to make it more easily removable. 06/23/2021: Today, the left wound is smaller. The lateral aspect that had opened up previously is now closed. The wound base has a healthy bed of granulation tissue and minimal slough. Unfortunately, on the right, the wound is larger and continues to be fairly macerated. He has also reopened the wound at his right ankle. He thinks this is due to the gait he has adopted secondary to his total contact cast and boot. 06/30/2021: T oday, both wounds are a little bit larger. The lateral aspect on the left has remained closed. He continues to have significant periwound maceration. The culture that I took from the right sided wound grew a population of bacteria that is not covered by his current [facility] antibiotic. The center of the right- sided wound continues to appear necrotic with nonviable fat. It probes deeper today, but does not reach bone. Burkel, Max ([id number]) [id number] Page 8 of 23 07/07/2021: The periwound maceration is a little bit less today. The right lateral foot wound has some areas that appear more viable and the necrotic center also looks a little bit better. The wound on the dorsal surface of his right foot near the ankle is contracting and the surface appears healthy. The left plantar wound surface looks healthy, but there is some new undermining on the medial portion. He did get his new Keystone antibiotic and began applying that to the right foot wound on Saturday. 07/14/2021: The intake nurse reported substantial drainage from his wounds, but the periwound skin actually looks better than is typical for him. The wound on the dorsal surface of his right foot near the ankle is smaller and just has a small open area underneath some dried eschar. The left plantar wound  surface looks healthy and there has been no significant accumulation of callus. The right lateral foot wound looks quite a bit better, with the central portion, which typically appears necrotic, looking more viable albeit pale. 07/22/2021: His left foot is extremely macerated today. The wound is about the same size. The wound on the dorsal surface of his right foot near the ankle had closed, but he traumatized it removing the dressing and there is a tiny skin tear in that location. The right lateral foot wound is bigger, but the surface appears healthy. 07/30/2021: The wound on the dorsal surface of his right foot near the ankle is closed. The right lateral foot wound again is a little bit bigger due to some undermining. The periwound skin is in better condition, however. He has been applying zinc oxide. The wound surface is a little bit dry today. On the left, he does not have the substantial maceration that we frequently see. The wound itself is smaller and has a clean surface. 08/06/2021: Both wounds seem to have deteriorated over the past week. The right lateral foot wound has a dry surface but the periwound is boggy.. Overall wound dimensions are about the same. On the left, the wound is about the same size, but there is more undermining present underneath periwound callus. 08/13/2021: The right sided wound looks about the same, but on the left there has been substantial deterioration. The undermining continues to extend under periwound callus. Once this was removed, substantial extension of the wound was present. There is no odor or purulent drainage but clearly the wounds have broken down. 08/20/2021: The wounds look about the same today. He has been out of his total contact cast and has just been changing the dressings  using topical Keystone with PolyMem Ag, Kerlix and Ace bandages. The wound on the top of his right ankle has reopened but this is quite small. There was a little bit of  purulent material that I expressed when examining this wound. 08/24/2021: After the aggressive debridement I performed at his last visit, the wounds actually look a little bit better today. They are smaller with the exception of the wound on the top of his right ankle which is a little bit bigger as some more skin pulled off when he was changing his dressing. We are using topical Keystone with PolyMem Ag Kerlix and Ace bandages. 09/02/2021: There has been really no change to any of his wounds. 09/16/2021: The patient was hospitalized last week with nausea, vomiting, and dehydration. He says that while he was in the hospital, his wounds were not really addressed properly. T oday, both plantar foot wounds are larger and the periwound skin is macerated. The wound on the dorsum of his right foot has a scab on the top. The right foot now has a crater where previously he had had nonviable fat. It looks as though this simply died and fell out. The periwound callus is wet. 09/24/2021: His wounds have deteriorated somewhat since his last visit. The wound on the dorsum of his right foot near his ankle is larger and has more nonviable tissue present. The crater in his right foot is even deeper; I cannot quite palpate or probe to bone but I am sure it is close. The wound on his left plantar foot has an odd boggy area in the center that almost feels as though it has fluid within it. He has run out of his topical Keystone antibiotic. We are using silver alginate on his wounds. 09/29/2021: He has developed a new wound on the dorsum of his left foot near his ankle. He says he thinks his wrapping is rubbing in that site. I would concur with this as the wound on his right ankle is larger. The left foot looks about the same. The right foot has the crater that was present last week. No significant slough accumulation, but his foot remains quite swollen and warm despite oral antibiotic therapy. 10/08/2021: All of his wounds look  about the same as last week. He did not start his oral antibiotics that are prescribed until just a couple of days ago; his [employer] compounded antibiotics formula has been changed and he is awaiting delivery of the new recipe. His MRI that was scheduled for earlier this week was canceled as no prior authorization had been obtained; unfortunately the tech responsible sent an email to my old [place] email, which I no longer use nor have access to. 10/18/2021: The wounds on his bilateral dorsal feet near the ankles are both improved. They are smaller and have just some eschar and slough buildup. The left plantar wound has a fair amount of undermining, but the surface is clean. There is some periwound callus accumulation. On the right plantar foot, there is nonviable fat leading to a deep tunnel that tracks towards his dorsal medial foot. There is periwound callus and slough accumulation, as well. His right foot and leg remain swollen as compared to the left. 10/25/2021: The wounds on his bilateral dorsal feet and at the ankles have broken down somewhat. They are little bit larger than last week. The left plantar wound continues to undermine laterally but the surface is clean. The right plantar foot wound shows some decreased depth in  the tunnel tracking towards his dorsal medial foot. He has not yet had the Doppler study that I ordered; it sounds like there is some confusion about the scheduling of the procedure. In addition, the MRI was denied and I have taken steps to appeal the denial. 11/24/2021: Since our last visit, Mr. Suver was admitted to the hospital where an MRI suggested osteomyelitis. He was taken the operating room by podiatry. Bone biopsies were negative for osteomyelitis. They debrided his wounds and applied myriad matrix. He saw them last week and they removed his staples. He is here today to continue his wound healing process. T oday, both of the dorsal foot/ankle wounds are  substantially smaller. There is just a little eschar overlying the left sided wound and some eschar and slough on the right. The right plantar foot ulcer has the healthiest surface of granulation tissue that I have seen to date. A portion of the myriad matrix failed to take and was hanging loose. It appears that myriad morcells were placed into the tunnel closest to the dorsal portion of his foot. These have sloughed off. The left plantar foot ulcer is about the same size, but has a much healthier surface than in the past. Both plantar ulcers have callus and slough accumulation. 12/07/2021: Left dorsal foot/ankle wound is closed. The right dorsal foot/ankle wound is nearly closed and just has a small open area with some eschar and slough. The right plantar foot wound has contracted quite a bit since our last visit. It has a healthy surface with just a little bit of slough accumulation and periwound callus buildup. The left plantar foot wound is about the same size but the surface appears healthy. There is a little slough and periwound callus on this side, as well. 12/15/2021: Both dorsal foot/ankle wounds are closed. The right plantar foot wound is substantially smaller than at our last visit. The tunneling that was present has nearly closed. There is just a little bit of slough buildup. The left plantar foot wound is also a little bit smaller today. The surface is the healthiest that I have ever seen it. Light slough and periwound callus accumulation on this side. 12/22/2021: The dorsal ankle/foot wounds remain closed. The right plantar foot wound continues to contract. There is still a bit of depth at the lateral portion of the wound but the surface has a good granulated appearance. The wound on the left is about the same size, the central indented portion is still adherent. It also is clean with good granulation tissue present. The periwound skin and callus are a bit macerated, however. 12/29/2021: Both  ankle wounds remain closed. The right plantar foot wound is less than half the size that it was last week. There is no depth any longer and the surface has just a little bit of slough and periwound callus. On the left, the wound is not much smaller, but the surface is healthier with good granulation tissue. There is also a little slough and periwound callus buildup. 01/05/2022: The right plantar foot wound continues to contract and is once again about half the size as it was last week. There is just a little bit of surface slough and periwound callus. On the left, the depth of the wound has decreased and the diameter has started to contract. It is also very clean with just a little slough and biofilm present. Ragon, Max ([id number]) [id number] Page 9 of 23 01/12/2022: The right plantar foot wound is smaller again today. There is  just some periwound callus and slough accumulation. On the left, there is a fair amount of undermining and the surface is a little boggy, but no other significant change. 01/19/2022: The right plantar wound continues to contract. There is minimal periwound callus accumulation and just a light layer of slough on the surface. On the left, the surface looks better, but there is fairly significant undermining near the 11:00 portion of the wound, aiming towards his great toe. The skin overlying this area is very healthy and has a good fat layer present. No malodor or purulent drainage. 01/26/2022: The right sided wound continues to contract and has just a light layer of slough on the surface. Minimal periwound callus. On the left, he still has substantial undermining and the tissue surface is not as robust as I would like to see. No malodor or purulent drainage, but he did say he had a lot of serous drainage over the weekend. 02/02/2022: The right foot wound is about a quarter of the size that it was at his last visit. There is just a little slough  on the surface and some periwound callus. On the left, the undermining persists and the tissue is still more pale, but he does not seem to have had as much drainage over the last weekend. We are waiting on a wound VAC. 02/09/2022: His right foot has healed. He received the wound VAC, but did not bring it to clinic with him today. The lateral aspect of the left foot wound has come in a little, but he still has substantial undermining on the medial and distal portion of the wound. Still with macerated periwound callus. 02/16/2022: The wound VAC was applied last Friday. T oday, there has been tremendous improvement in the surface of the wound bed. The undermined portion of the wound has come in a bit. There is still some overhanging dead skin. Overall the wound looks much better. 02/23/2022: No significant change in the overall wound dimensions, but the wound surface continues to improve and appears healthier and more robust. There is some periwound callus accumulation and overhanging skin. No concern for infection. 03/02/2022: Although the measurements taken in clinic today were unchanged, the visual appearance of the wound looks like it is smaller and the undermining/tunneling is filling with flesh. There is less periwound tissue maceration than usual. No malodor or purulent drainage. No concern for infection. 03/09/2022: The undermining has come in by couple of millimeters. The periwound is a little bit more macerated this week. No concern for infection. 12/13; substantial wound on the left plantar foot. We are using silver collagen and a wound VAC. 03/23/2022: The undermining continues to contract. The wound surface is a little bit dry. 03/30/2022: The undermining has essentially resolved. There is still some depth at the center of the wound. The wound surface has good beefy tissue and the moisture balance is better. 04/06/2022: The wound dimensions are about the same, except that the depth is beginning  to fill in. He has had more drainage this week, but there is no obvious concern for infection. 04/13/2022: He has built up a little bit of callus around the edges of the wound, creating some undermining. Otherwise the wound looks fairly unchanged. 04/20/2022: The wound bed tissue looks very healthy and robust. Light biofilm on the surface. There is some built up wet callus around the wound edges, but no undermining. 04/27/2022: The wound surface continues to look very healthy. Although the wound dimensions measured the same, it looks smaller  to me. No real callus accumulation this week. Minimal slough on the surface. 05/04/2022: The wound measured smaller and shallower this week. We are still awaiting insurance approval for Apligraf. 05/11/2022: The wound is about the same in terms of depth but measured a little bit narrower today. He has built up a rim of moist callus around the edges. He has been approved for Apligraf and we will apply that today. 05/18/2022: The wound measured a little bit smaller again today. No significant callus buildup this week. Electronic Signature(s) Signed: 05/18/2022 9:28:35 AM By: Max Maudlin MD FACS Entered By: [name] on 05/18/2022 09:28:35 -------------------------------------------------------------------------------- Physical Exam Details Patient Name: Date of Service: [name], [name] 05/18/2022 8:15 A M Medical Record Number: [id number] Patient Account Number: [id number] Date of Birth/Sex: Treating RN: 08/21/1985 (37 y.o. M) Primary Care Provider: Seward [name] Other Clinician: Referring Provider: Treating Provider/Extender: [name] in Treatment: 133 Constitutional . . . . no acute distress. Respiratory Normal work of breathing on room air. Notes [place], Max ([id number]) [id number] Page 10 of 23 05/18/2022: The wound measured a little bit smaller again today. No significant callus  buildup this week. Electronic Signature(s) Signed: 05/18/2022 9:29:04 AM By: Max Maudlin MD FACS Entered By: [name] on 05/18/2022 09:29:03 -------------------------------------------------------------------------------- Physician Orders Details Patient Name: Date of Service: [name], [name] 05/18/2022 8:15 A M Medical Record Number: [id number] Patient Account Number: [id number] Date of Birth/Sex: Treating RN: [birth year] (37 y.o. 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 - Dr. Celine [name] - Room 1 with [name] Return A Wednesday 2/21 @ 0815 am Cellular or Tissue Based Products Cellular or Tissue Based Product Type: - Apligraf #2 daptic or Mepitel. (DO NOT REMOVE). - may Cellular or Tissue Based Product applied to wound bed, secured with steri-strips, cover with A change black sponge, leave steristrips in place Bathing/ Shower/ Hygiene May shower and wash wound with soap and water. Negative Presssure Wound Therapy Wound #3 Left,Lateral,Plantar Foot Wound Vac to wound continuously at 147m/hg pressure - decrease to 100 mm/hg pressure Black Foam Edema Control - Lymphedema / SCD / Other Bilateral Lower Extremities Avoid standing for long periods of time. Exercise regularly Compression stocking or Garment 20-30 mm/Hg pressure to: - to both legs daily Off-Loading Open toe surgical shoe to: - Both feet Additional Orders / Instructions Follow Nutritious Diet Non Wound Condition pply the following to affected area as directed: - continue to apply foam donut or cushion to healed right  plantar foot A Wound Treatment Wound #3 - Foot Wound Laterality: Plantar, Left, Lateral Cleanser: Soap and Water 3 x Per Week/30 Days Discharge Instructions: May shower and wash wound with dial antibacterial soap and water prior to dressing change. Peri-Wound Care: Skin Prep (Generic) 3 x Per Week/30 Days Discharge Instructions: Use skin prep as directed Prim Dressing: Apligraf ary 3 x Per Week/30 Days Prim Dressing: VAC ary 3 x Per Week/30 Days Secondary Dressing: ADAPTIC TOUCH 3x4.25 in 3 x Per Week/30 Days [place], CMali([id number] Page 11 of 23 Discharge Instructions: Apply over primary dressing as directed. Secondary Dressing: Zetuvit Plus 4x8 in (Dispense As Written) 3 x Per Week/30 Days Discharge Instructions:  Apply over primary dressing as directed. Secured With: Elastic Bandage 4 inch (ACE bandage) (Generic) 3 x Per Week/30 Days Discharge Instructions: Secure with ACE bandage as directed. Secured With: 23M Medipore Public affairs consultant Surgical T 2x10 (in/yd) (Generic) 3 x Per Week/30 Days ape Discharge Instructions: Secure with tape as directed. Compression Wrap: Kerlix Roll 4.5x3.1 (in/yd) (Generic) 3 x Per Week/30 Days Discharge Instructions: Apply Kerlix and Coban compression as directed. Electronic Signature(s) Signed: 05/18/2022 10:47:57 AM By: Max Maudlin MD FACS Entered By: [name] on 05/18/2022 09:29:15 -------------------------------------------------------------------------------- Problem List Details Patient Name: Date of Service: [name], [name] 05/18/2022 8:15 A M Medical Record Number: [id number] Patient Account Number: [id number] Date of Birth/Sex: Treating RN: October 20, 1985 (37 y.o. [name] Primary Care Provider: Seward [name] Other Clinician: Referring Provider: Treating Provider/Extender: [name] in Treatment: [phone] Active Problems ICD-10 Encounter Code Description  Active Date MDM Diagnosis L97.528 Non-pressure chronic ulcer of other part of left foot with other specified 10/24/2019 No Yes severity E11.621 Type 2 diabetes mellitus with foot ulcer 10/24/2019 No Yes Inactive Problems ICD-10 Code Description Active Date Inactive Date L97.518 Non-pressure chronic ulcer of other part of right foot with other specified severity 07/14/2020 07/14/2020 L97.518 Non-pressure chronic ulcer of other part of right foot with other specified severity 10/24/2019 10/24/2019 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, right ankle and foot 10/24/2019 10/24/2019 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 08/10/2020 08/10/2020 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, left ankle and foot 10/24/2019 10/24/2019 L97.322 Non-pressure chronic ulcer of left ankle with fat layer exposed 09/29/2021 09/29/2021 [place], Max ([id number]) [id number] Page 12 of 23 B95.62 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 10/24/2019 10/24/2019 classified elsewhere Resolved Problems Electronic Signature(s) Signed: 05/18/2022 9:27:40 AM By: Max Maudlin MD FACS Entered By: [name] on 05/18/2022 09:27:40 -------------------------------------------------------------------------------- Progress Note Details Patient Name: Date of Service: [name], [name] 05/18/2022 8:15 A M Medical Record Number: [id number] Patient Account Number: [id number] Date of Birth/Sex: Treating RN: 1986-04-04 (37 y.o. 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 He comes in for review of a wound over the plantar fifth metatarsal head extending into the lateral part of the foot. He was followed for this previously by his podiatrist Dr. Cornelius [name]. As the patient tells his story he went to see podiatry first for a swelling he developed on the lateral part of his fifth metatarsal head in May. He states this was "open" by podiatry and the area closed. He was followed up in June and it was again opened callus removed and it closed promptly. There were plans being made for surgery on the fifth metatarsal head in June however his blood sugar was apparently too high for anesthesia. Apparently the area was debrided and opened again in June and it is never closed since. Looking over the records from podiatry I am really not able to follow this. It was clear when he was first seen it was before 5/14 at that point  he already had a wound. By 5/17 the ulcer was resolved. I do not see anything about a procedure. On 5/28 noted to have pre-ulcerative moderate keratosis. X-ray noted 1/5 contracted toe and tailor's bunion and metatarsal deformity. On a visit date on 09/28/2018 the dorsal part of the left foot it healed and resolved. There was concern about swelling in his lower extremity he was sent to the ER.. As far as I can tell he was seen in the ER on 7/12 with an ulcer on his left foot. A DVT rule out of the left leg was negative. I do not think I have complete records from podiatry but I am not able to verify the procedures this patient states he had. He states after the last procedure the wound has never closed although I am not able to follow this in the records I have from podiatry. He has not had a recent x-ray The patient has been using Neosporin on  the wound. He is wearing a Darco shoe. He is still very active up on his foot working and exercising. Past medical history; type 2 diabetes ketosis-prone, leg swelling with a negative DVT study in July. Non-smoker ABI in our clinic was 0.85 on the left 10/16; substantial wound on the plantar left fifth met head extending laterally almost to the dorsal fifth MTP. We have been using silver alginate we gave him a Darco forefoot off loader. An x-ray did not show evidence of osteomyelitis did note soft tissue emphysema which I think was due to gas tracking through an open wound. There is no doubt in my mind he requires an MRI 10/23; MRI not booked until 3 November at the earliest this is largely due to his glucose sensor in the right arm. We have been using silver alginate. There has been an improvement 10/29; I am still not exactly sure when his MRI is booked for. He says it is the third but it is the 10th in epic. This definitely needs to be done. He is running a low-grade fever today but no other symptoms. No real improvement in the 1 02/26/2019 patient presents today for a follow-up visit here in our clinic he is last been seen in the clinic on October 29. Subsequently we were working on getting MRI to evaluate and see what exactly was going on and where we would need to go from the standpoint of whether or not he had osteomyelitis and again what treatments were going be required. Subsequently the patient ended up being admitted to the hospital on 02/07/2019 and was discharged on 02/14/2019. This is a somewhat interesting admission with a discharge diagnosis of pneumonia due to COVID-19 although he was positive for COVID-19 when tested at the urgent care but negative x2 when he was actually in the hospital. With that being said he did have acute respiratory failure with hypoxia and it was noted he also have a left foot ulceration with osteomyelitis. With that being said he did require oxygen for his  pneumonia and I level 4 L. He was placed on antivirals and steroids for the COVID-19. He was also transferred to the [facility] at one point. Nonetheless he did subsequently discharged home and since being home has done much better in that regard. The CT angiogram did not show any pulmonary embolism. With regard to the osteomyelitis the patient was placed on vancomycin and Zosyn while in the hospital but has been changed to Augmentin at discharge. It was also recommended that  he follow- up with wound care and podiatry. Podiatry however wanted him to see [name] according to the patient prior to them doing anything further. His hemoglobin A1c was 9.9 as noted in the hospital. Have an MRI of the left foot performed while in the hospital on 02/04/2019. 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 There is an overlying plantar open wound noted an abscess tracking back along the lateral aspect of the fifth metatarsal shaft. There is otherwise diffuse cellulitis and mild fasciitis without findings of polymyositis. The patient did have recently pneumonia secondary to COVID-19 I looked in the chart through epic and it does appear that the patient may need to have an additional x-ray just to ensure everything is cleared and that he has no airspace disease prior to putting him into the [name]. 03/05/2019; patient was readmitted to the clinic last week. He was hospitalized twice for a viral upper respiratory tract infection from 11/1 through 11/4 and then Ragan, Max ([id number]) [id number] Page 13 of 23 11/5 through 11/12 ultimately this turned out to be Covid pneumonitis. Although he was discharged on oxygen he is not using it. He says he feels fine. He has no exercise limitation no cough no sputum. His O2 sat in our clinic today was 100% on room air. 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 He received Vanco and Zosyn in the hospital and then was discharged on 2 weeks of Augmentin. I do not see any relevant cultures. He was supposed to follow-up with infectious disease but I do not see that he has an appointment. 12/8; patient saw Dr. Novella Olive of infectious disease last week. He felt that he had had adequate antibiotic therapy. He did not go to follow-up with Dr. Amalia Hailey of podiatry and I have again talked to him about the pros and cons of this. He does not want to consider a ray amputation of this time. He is aware of the risks of recurrence, migration etc. He started HBO today and tolerated this well. He can complete the Augmentin that I gave him last week. I have looked over the lab work that Dr. Chana Bode ordered his C-reactive protein was 3.3 and his sedimentation rate was 17. The C-reactive protein is never really been measurably that high in this patient 12/15; not much change in the wound today however he has undermining along the lateral part of the foot again more extensively than last week. He has some rims of epithelialization. We have been using silver alginate. He is undergoing hyperbarics but did not dive today 12/18; in for his obligatory first total contact cast change. Unfortunately there was pus coming from the undermining area around his fifth metatarsal head. This was cultured but will preclude reapplication of a cast. He is seen in conjunction with HBO 12/24; patient had staph lugdunensis in the wound in the undermining area laterally last time. We put him on doxycycline which should have covered this. The wound looks better today. I am going to give him another week of doxycycline before reattempting the total contact cast 12/31; the patient is completing antibiotics. Hemorrhagic debris in the distal part of the wound with some undermining  distally. He also had hyper granulation. Extensive debridement with a #5 curette. The infected area that was on the lateral part of the fifth  met head is closed over. I do not think he needs any more antibiotics. Patient was seen prior to HBO. Preparations for a total contact cast were made in the cast will be placed post hyperbarics 04/11/19; once again the patient arrives today without complaint. He had been in a cast all week noted that he had heavy drainage this week. This resulted in large raised areas of macerated tissue around the wound 1/14; wound bed looks better slightly smaller. Hydrofera Blue has been changing himself. He had a heavy drainage last week which caused a lot of maceration around the wound so I took him out of a total contact cast he says the drainage is actually better this week He is seen today in conjunction with HBO 1/21; returns to clinic. He was up in [place] for a day or 2 attending a funeral. He comes back in with the wound larger and with a large area of exposed bone. He had osteomyelitis and septic arthritis of the fifth left metatarsal head while he was in hospital. He received IV antibiotics in the hospital for a prolonged period of time then 3 weeks of Augmentin. Subsequently I gave him 2 weeks of doxycycline for more superficial wound infection. When I saw this last week the wound was smaller the surface of the wound looks satisfactory. 1/28; patient missed hyperbarics today. Bone biopsy I did last time showed Enterococcus faecalis and Staphylococcus lugdunensis . He has a wide area of exposed bone. We are going to use silver alginate as of today. I had another ethical discussion with the patient. 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 In this situation I think the likelihood of healing this is low. Therefore I have recommended a ray amputation and with the patient's agreement I have referred him to Dr. Doran Durand. The other issue  is that his compliance with hyperbarics has been minimal because of his work schedule and given his underlying decision I am going to stop this today READMISSION 10/24/2019 MRI 09/29/2019 left foot IMPRESSION: 1. Apparent skin ulceration inferior and lateral to the 5th metatarsal base with underlying heterogeneous T2 signal and enhancement in the subcutaneous fat. Small peripherally enhancing fluid collections along the plantar and lateral aspects of the 5th metatarsal base suspicious for abscesses. 2. Interval amputation through the mid 5th metatarsal with nonspecific low-level marrow edema and enhancement. Given the proximity to the adjacent soft tissue inflammatory changes, osteomyelitis cannot be excluded. 3. The additional bones appear unremarkable. MRI 09/29/2019 right foot IMPRESSION: 1. Soft tissue ulceration lateral to the 5th MTP joint. There is low-level T2 hyperintensity within the 4th and 5th metatarsal heads and adjacent proximal phalanges without abnormal T1 signal or cortical destruction. These findings are nonspecific and could be seen with early marrow edema, hyperemia or early osteomyelitis. No evidence of septic joint. 2. Mild tenosynovitis and synovial enhancement associated with the extensor digitorum tendons at the level of the midfoot. 3. Diffuse low-level muscular T2 hyperintensity and enhancement, most consistent with diabetic myopathy. LEFT FOOT BONE Methicillin resistant staphylococcus aureus Staphylococcus lugdunensis MIC MIC CIPROFLOXACIN >=8 RESISTANT Resistant <=0.5 SENSI... Sensitive CLINDAMYCIN <=0.25 SENS... Sensitive >=8 RESISTANT Resistant ERYTHROMYCIN >=8 RESISTANT Resistant >=8 RESISTANT Resistant GENTAMICIN <=0.5 SENSI... Sensitive <=0.5 SENSI... Sensitive Inducible Clindamycin NEGATIVE Sensitive NEGATIVE Sensitive OXACILLIN >=4 RESISTANT Resistant 2 SENSITIVE Sensitive RIFAMPIN <=0.5 SENSI... Sensitive <=0.5 SENSI... Sensitive TETRACYCLINE  <=1 SENSITIVE Sensitive <=1 SENSITIVE Sensitive TRIMETH/SULFA <=10 SENSIT Sensitive <=10 SENSIT Sensitive ... Marland Kitchen.. VANCOMYCIN 1 SENSITIVE Sensitive <=0.5 SENSI... Sensitive  Right foot bone . Component 3 wk ago Specimen Description BONE Special Requests RIGHT 4 METATARSAL SAMPLE B Gram Stain NO WBC SEEN NO ORGANISMS SEEN Feldner, Max ([id number]) [id number] Page 14 of 23 Culture RARE METHICILLIN RESISTANT STAPHYLOCOCCUS AUREUS NO ANAEROBES ISOLATED Performed at [facility], [place] [address], [place], [place] [address] Report Status 10/08/2019 FINAL Organism ID, Bacteria METHICILLIN RESISTANT STAPHYLOCOCCUS AUREUS Resulting Agency [facility] Susceptibility Methicillin resistant staphylococcus aureus MIC CIPROFLOXACIN >=8 RESISTANT Resistant CLINDAMYCIN <=0.25 SENS... Sensitive ERYTHROMYCIN >=8 RESISTANT Resistant GENTAMICIN <=0.5 SENSI... Sensitive Inducible Clindamycin NEGATIVE Sensitive OXACILLIN >=4 RESISTANT Resistant RIFAMPIN <=0.5 SENSI... Sensitive TETRACYCLINE <=1 SENSITIVE Sensitive TRIMETH/SULFA <=10 SENSIT Sensitive ... VANCOMYCIN 1 SENSITIVE Sensitive This is a patient we had in clinic earlier this year with a wound over his left fifth metatarsal head. 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 Doran Durand on 05/16/2019. He seems to have developed problems with wounds on his bilateral feet in June 2021 although he may have had problems earlier than this. He was in an urgent care with a right foot ulcer on 09/26/2019 and given a course of doxycycline. This was apparently after having trouble getting into see orthopedics. He was seen by podiatry on 09/28/2019 noted  to have bilateral lower extremity ulcers including the left lateral fifth metatarsal base and the right subfifth met head. It was noted that had purulent drainage at that time. He required hospitalization from 6/20 through 7/2. This was because of worsening right foot wounds. He underwent bilateral operative incision and drainage and bone biopsies bilaterally. Culture results are listed above. He has been referred back to clinic by Dr. Jacqualyn Posey of podiatry. He is also followed by Dr. Megan Salon who saw him yesterday. He was discharged from hospital on Zyvox Flagyl and Levaquin and yesterday changed to doxycycline Flagyl and Levaquin. His inflammatory markers on 6/26 showed a sedimentation rate of 129 and a C-reactive protein of 5. This is improved to 14 and 1.3 respectively. This would indicate improvement. ABIs in our clinic today were 1.23 on the right and 1.20 on the left 11/01/2019 on evaluation today patient appears to be doing fairly well in regard to the wounds on his feet at this point. Fortunately there is no signs of active infection at this time. No fevers, chills, nausea, vomiting, or diarrhea. He currently is seeing infectious disease and still under their care at this point. Subsequently he also has both wounds which she has not been using collagen on as he did not receive that in his packaging he did not call us and let us know that. Apparently that just was missed on the order. Nonetheless we will get that straightened out today. 8/9-Patient returns for bilateral foot wounds, using Prisma with hydrogel moistened dressings, and the wounds appear stable. Patient using surgical shoes, avoiding much pressure or weightbearing as much as possible 8/16; patient has bilateral foot wounds. 1 on the right lateral foot proximally the other is on the left mid lateral foot. Both required debridement of callus and thick skin around the wounds. We have been using silver collagen 8/27; patient has  bilateral lateral foot wounds. The area on the left substantially surrounded by callus and dry skin. This was removed from the wound edge. The  underlying wound is small. The area on the right measured somewhat smaller today. 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 Unfortunately I did not update his antibiotics during today's visit. 9/10 I reviewed Dr. Hale Bogus last notes he felt he had completed antibiotics his inflammatory markers were reasonably well controlled. He has a small wound on the lateral left foot and a tiny area on the right which is just above closed. He is using Hydrofera Blue with border foam he has bilateral surgical shoes 9/24; 2 week f/u. doing well. right foot is closed. left foot still undermined. 10/14; right foot remains closed at the fifth met head. The area over the base of the left fifth metatarsal has a small open area but considerable undermining towards the plantar foot. Thick callus skin around this suggests an adequate pressure relief. We have talked about this. He says he is going to go back into his cam boot. I suggested a total contact cast he did not seem enamored with this suggestion 10/26; left foot base of the fifth metatarsal. Same condition as last time. He has skin over the area with an open wound however the skin is not adherent. He went to see Dr. Earleen Newport who did an x-ray and culture of his foot I have not reviewed the x-ray but the patient was not told anything. He is on doxycycline 11/11; since the patient was last here he was in the emergency room on 10/30 he was concerned about swelling in the left foot. They did not do any cultures or x-rays. They changed his antibiotics to cephalexin. Previous culture showed group B strep. The cephalexin is appropriate as doxycycline has less than predictable coverage. Arrives in clinic today with swelling over this area under the wound. He also has a  new wound on the right fifth metatarsal head 11/18; the patient has a difficult wound on the lateral aspect of the left fifth metatarsal head. The wound was almost ballotable last week I opened it slightly expecting to see purulence however there was just bleeding. I cultured this this was negative. X-ray unchanged. We are trying to get an MRI but I am not sure were going to be able to get this through his insurance. He also has an area on the right lateral fifth metatarsal head this looks healthier 12/3; the patient finally got our MRI. Surprisingly this did not show osteomyelitis. I did show the soft tissue ulceration at the lateral plantar aspect of the fifth metatarsal base with a tiny residual 6 mm abscess overlying the superficial fascia I have tried to culture this area I have not been able to get this to grow anything. Nevertheless the protruding tissue looks aggravated. I suspect we should try to treat the underlying "abscess with broad-spectrum antibiotics. I am going to start him on Levaquin and Flagyl. He has much less edema in his legs and I am going to continue to wrap his legs and see him weekly 12/10. I started Levaquin and Flagyl on him last week. He just picked up the Flagyl apparently there was some delay. The worry is the wound on the left fifth metatarsal base which is substantial and worsening. His foot looks like he inverts at the ankle making this a weightbearing surface. Certainly no improvement in fact I think the measurements of this are somewhat worse. We have been using 12/17; he apparently just got the Levaquin yesterday this is 2 weeks after the fact.  He has completed the Flagyl. The area over the left fifth metatarsal base still has protruding granulation tissue although it does not look quite as bad as it did some weeks ago. He has severe bilateral lymphedema although we have not been treating him for wounds on his legs this is definitely going to require compression.  There was so much edema in the left I did not wish to put him in a total contact cast today. I am going to increase his compression from 3-4 layer. The area on the right lateral fifth met head actually look quite good and superficial. 12/23; patient arrived with callus on the right fifth met head and the substantial hyper granulated callused wound on the base of his fifth metatarsal. He says he is completing his Levaquin in 2 days but I do not think that adds up with what I gave him but I will have to double check this. We are using Hydrofera Blue on both areas. My plan is to put the left leg in a cast the week after New Year's 04/06/2020; patient's wounds about the same. Right lateral fifth metatarsal head and left lateral foot over the base of the fifth metatarsal. There is undermining on the left lateral foot which I removed before application of total contact cast continuing with Hydrofera Blue new. Patient tells me he was seen by endocrinology today lab work was done [Dr. Kerr]. Also wondering whether he was referred to cardiology. I went over some lab work from previously does not have chronic renal failure certainly not nephrotic range proteinuria he does have very poorly controlled diabetes but this is not his most updated lab work. Hemoglobin A1c has been over 11 1/10; the patient had a considerable amount of leakage towards mid part of his left foot with macerated skin however the wound surface looks better the area on the right lateral fifth met head is better as well. I am going to change the dressing on the left foot under the total contact cast to silver alginate, continue with Hydrofera Blue on the right. 1/20; patient was in the total contact cast for 10 days. Considerable amount of drainage although the skin around the wound does not look too bad on the left foot. The area on the right fifth metatarsal head is closed. Our nursing staff reports large amount of drainage out of the left  lateral foot wound Rabinovich, Max ([id number]) [id number] Page 15 of 23 1/25; continues with copious amounts of drainage described by our intake staff. PCR culture I did last week showed E. coli and Enterococcus faecalis and low quantities. Multiple resistance genes documented including extended spectrum beta lactamase, MRSA, MRSE, quinolone, tetracycline. The wound is not quite as good this week as it was 5 days ago but about the same size 2/3; continues with copious amounts of malodorous drainage per our intake nurse. The PCR culture I did 2 weeks ago showed E. coli and low quantities of Enterococcus. There were multiple resistance genes detected. I put Neosporin on him last week although this does not seem to have helped. The wound is slightly deeper today. Offloading continues to be an issue here although with the amount of drainage she has a total contact cast is just not going to work 2/10; moderate amount of drainage. Patient reports he cannot get his stocking on over the dressing. I told him we have to do that the nurse gave him suggestions on how to make this work. The wound is on the bottom  and lateral part of his left foot. Is cultured predominantly grew low amounts of Enterococcus, E. coli and anaerobes. There were multiple resistance genes detected including extended spectrum beta lactamase, quinolone, tetracycline. I could not think of an easy oral combination to address this so for now I am going to do topical antibiotics provided by [facility] I think the main agents here are vancomycin and an aminoglycoside. We have to be able to give him access to the wounds to get the topical antibiotic on 2/17; moderate amount of drainage this is unchanged. He has his Keystone topical antibiotic against the deep tissue culture organisms. He has been using this and changing the dressing daily. Silver alginate on the wound surface. 2/24; using Keystone antibiotic with  silver alginate on the top. He had too much drainage for a total contact cast at one point although I think that is improving and I think in the next week or 2 it might be possible to replace a total contact cast I did not do this today. In general the wound surface looks healthy however he continues to have thick rims of skin and subcutaneous tissue around the wide area of the circumference which I debrided 06/04/2020 upon evaluation today patient appears to be doing well in regard to his wound. I do feel like he is showing signs of improvement. There is little bit of callus and dead tissue around the edges of the wound as well as what appears to be a little bit of a sinus tract that is off to the side laterally I would perform debridement to clear that away today. 3/17; left lateral foot. The wound looks about the same as I remember. Not much depth surface looks healthy. No evidence of infection 3/25; left lateral foot. Wound surface looks about the same. Separating epithelium from the circumference. There really is no evidence of infection here however not making progress by my view 3/29; left lateral foot. Surface of the wound again looks reasonably healthy still thick skin and subcutaneous tissue around the wound margins. There is no evidence of infection. One of the concerns being brought up by the nurses has again the amount of drainage vis--vis continued use of a total contact cast 4/5; left lateral foot at roughly the base of the fifth metatarsal. Nice healthy looking granulated tissue with rims of epithelialization. The overall wound measurements are not any better but the tissue looks healthy. The only concern is the amount of drainage although he has no surrounding maceration with what we have been doing recently to absorb fluid and protect his skin. He also has lymphedema. 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 He states that the kerlix/coban under the TCC rolled down and could not get it back up. He has been using an offloading scooter and has somehow hurt his right foot using this device. This happened last week. He states that the side of his right foot developed a blister and opened. The top of his foot also has a few small open wounds he thinks is due to his socks rubbing in his shoes. He has not been using any dressings to the wound. He denies purulent drainage, fever/chills or erythema to the wounds. 4/22; patient presents for  1 week follow-up. He developed new wounds to the right foot that were evaluated at last clinic visit. He continues to have a total contact cast to the left leg and he reports no issues. He has been using silver collagen to the right foot wounds with no issues. He denies purulent drainage, fever/chills or erythema to the right foot wounds. He has no complaints today 4/25; patient presents for 1 week follow-up. He has a total contact cast of the left leg and reports no issues. He has been using silver alginate to the right foot wound. He denies purulent drainage, fever/chills or erythema to the right foot wounds. 5/2 patient presents for 1 week follow-up. T contact cast on the left. The wound which is on the base of the plantar foot at the base of the fifth metatarsal otal actually looks quite good and dimensions continue to gradually contract. HOWEVER the area on the right lateral fifth metatarsal head is much larger than what I remember from 2 weeks ago. Once more is he has significant levels of hypergranulation. Noteworthy that he had this same hyper granulated response on his wound on the left foot at one point in time. So much so that he I thought there was an underlying fluid collection. Based on this  I think this just needs debridement. 5/9; the wound on the left actually continues to be gradually smaller with a healthy surface. Slight amount of drainage and maceration of the skin around but not too bad. However he has a large wound over the right fifth metatarsal head very much in the same configuration as his left foot wound was initially. I used silver nitrate to address the hyper granulated tissue no mechanical debridement 5/16; area on the left foot did not look as healthy this week deeper thick surrounding macerated skin and subcutaneous tissue. oo The area on the right foot fifth met head was about the same oo The area on the right ankle that we identified last week is completely broken down into an open wound presumably a stocking rubbing issue 5/23; patient has been using a total contact cast to the left side. He has been using silver alginate underneath. He has also been using silver alginate to the right foot wounds. He has no complaints today. He denies any signs of infection. 5/31; the left-sided wound looks some better measure smaller surface granulation looks better. We have been using silver alginate under the total contact cast oo The large area on his right fifth met head and right dorsal foot look about the same still using silver alginate 6/6; neither side is good as I was hoping although the surface area dimensions are better. A lot of maceration on his left and right foot around the wound edge. Area on the dorsal right foot looks better. He says he was traveling. I am not sure what does the amount of maceration around the plantar wounds may be drainage issues 6/13; in general the wound surfaces look quite good on both sides. Macerated skin and raised edges around the wound required debridement although in general especially on the left the surface area seems improved. oo The area on the right dorsal ankle is about the same I thought this would not be such a problem to  close 6/20; not much change in either wound although the one on the right looks a little better. Both wounds have thick macerated edges to the skin requiring debridements. We have been using silver alginate. The area on the  dorsal right ankle is still open I thought this would be closed. 6/28; patient comes in today with a marked deterioration in the right foot wound fifth met head. Wide area of exposed bone this is a drastic change from last time. The area on the left there we have been casting is stagnant. We have been using silver alginate in both wound areas. 7/5; bone culture I did for PCR last time was positive for Pseudomonas, group B strep, Enterococcus and Staph aureus. There was no suggestion of methicillin resistance or ampicillin resistant genes. This was resistant to tetracycline however He comes into the clinic today with the area over his right plantar fifth metatarsal head which had been doing so well 2 weeks ago completely necrotic feeling bone. I do not know that this is going to be salvageable. The left foot wound is certainly no smaller but it has a better surface and is superficial. 7/8; patient called in this morning to say that his total contact cast was rubbing against his foot. He states he is doing fine overall. He denies signs of infection. 7/12; continued deterioration in the wound over the right fifth metatarsal head crumbling bone. This is not going to be salvageable. The patient agrees and wants to be referred to Dr. Doran Durand which we will attempt to arrange as soon as possible. I am going to continue him on antibiotics as long as that takes so I will renew those today. The area on the left foot which is the base of the fifth metatarsal continues to look somewhat better. Healthy looking tissue no depth no debridement is necessary here. 7/20; the patient was kindly seen by Dr. Doran Durand of orthopedics on 10/19/2020. He agreed that he needed a ray amputation on the right and he  said he would have a look at the fourth as well while he was intraoperative. Towards this end we have taken him out of the total contact cast on the left we will put him in a wrap with Hydrofera Blue. Tamm, Max ([id number]) [id number] Page 16 of 23 As I understand things surgery is planned for 7/21 7/27; patient had his surgery last Thursday. 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 Apparently everything went well we did not still disturb that today The area on the left foot actually looks quite good. He has been much less mobile which probably explains this he did not seem to do well in the total contact cast secondary to drainage and maceration I think. 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 Fortunately there is no evidence of active infection at this time. No fevers, chills, nausea, vomiting, or diarrhea. Overall I think that he is actually doing extremely well. Nonetheless I do believe that he is staying off of this more following the surgery in his right foot that is the reason the left is doing so great. 8/16; left plantar foot wound. This looks smaller than the last time I saw this he is using Hydrofera Blue. The surgical wound on the right foot is being followed by Dr. Doran Durand we did not look at this today. He has surgical shoes on both feet 8/23; left plantar foot wound not as good this week. Surrounding macerated skin and subcutaneous tissue everything looks moist and wet. I do not think he is offloading this adequately. He is using a surgical shoe Apparently the right foot surgical wound  is not open although I did not check his foot 8/31; left plantar foot lateral aspect. Much improved this week. He has no maceration. Some improvement in the surface area of the wound but most impressively the depth is come in we are using silver alginate. The  patient is a Product/process development scientist. He is asked that we write him a letter so he can go back to work. I have also tried to see if we can write something that will allow him to limit the amount of time that he is on his foot at work. 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 Hopefully the latter can be avoided 9/6; I believe the patient missed an appointment last week. He arrives in today with a wound looking roughly the same certainly no better. Undermining laterally and also inferiorly. We used molecuLight today in training with the patient's permission.. We are using silver alginate 9/21 wound is measuring bigger this week although this may have to do with the aggressive circumferential debridement last week in response to the blush fluorescence on the MolecuLight. Culture I did last week showed significant MSSA and E. coli. I put him on Augmentin but he has not started it yet. We are also going to send this for compounded antibiotics at [facility]. There is no evidence of systemic infection 9/29; silver alginate. His Keystone arrived. He is completing Augmentin in 2 days. Offloading in a cam boot. Moderate drainage per our intake staff 10/5; using silver alginate. He has been using his Chloride. He has completed his Augmentin. Per our intake nurse still a lot of drainage, far too much to consider a total contact cast. Wound measures about the same. He had the same undermining area that I defined last week from a roughly 11-3. I remove this today 10/12; using silver alginate he is using the [place]. He comes in for a nurse visit hence we are applying [employer] twice a week. Measuring slightly better today and less notable drainage. Extensive debridement of the wound edge last time 10/18; using topical Keystone and silver alginate and a soft cast. Wound measurements about the same. Drainage was through his soft cast. We  are changing this twice a week Tuesdays and Friday 10/25; comes in with moderate drainage. Still using Keystone silver alginate and a soft cast. Wound dimensions completely the same.He has a lot of edema in the left leg he has lymphedema. Asking for [name] to consider wrapping him as he cannot get his stocking on over the soft cast 11/2; comes in with moderate to large drainage slightly smaller in terms of width we have been using [place]. His wound looks satisfactory but not much improvement 11/4; patient presents today for obligatory cast change. Has no issues or complaints today. He denies signs of infection. 11/9; patient traveled this weekend to [place], was on the cast quite a bit. Staining of the cast with black material from his walking boot. Drainage was not quite as bad as we feared. Using silver alginate and Keystone 11/16; we do not have size for cast therefore we have been putting a soft cast on him since the change on Friday. Still a significant amount of drainage necessitating changing twice a week. We have been using the Keystone at cast changes either hard or soft as well as silver alginate Comes in the clinic with things actually looking fairly good improvement in width. He says his  offloading is about the same 02/24/2021 upon evaluation today patient actually comes back in and is doing excellent in regard to his foot ulcer this is significantly smaller even compared to the last visit. The soft cast seems to have done extremely well for him which is great news. I do not see any signs of infection minimal debridement will be needed today. 11/30; left lateral foot much improved half a centimeter improvement in surface area. No evidence of infection. He seems to be doing better with the soft cast in the TCC therefore we will continue with this. He comes back in later in the week for a change with the nurses. This is due to drainage 12/6; no improvement in dimensions. Under illumination some  debris on the surface we have been using silver alginate, soft cast. If there is anything optimistic here he seems to have have less drainage 12/13. Dimensions are improved both length and width and slightly in depth. Appears to be quite healthy today. Raised edges of this thick skin and callus around the edges however. He is in a soft cast were bringing him back once for a change on Friday. Drainage is better 12/20. Dimensions are improved. He still has raised edges of thick skin and callus around the edges. We are using a soft cast 12/28; comes in today with thick callus around the wound. Using silver under alginate under a soft cast. I do not think there is much improvement in any measurement 2023 04/06/2021; patient was put in a total contact cast. Unfortunately not much change in surface area 1/10; not much different still thick callus and skin around the edge in spite of the total contact cast. This was just debrided last week we have been using the [facility] compounded antibiotic and silver alginate under a total contact cast 1/18 the patient's wound on the left side is doing nicely. smaller HOWEVER he comes in today with a wound on the right foot laterally. blister most likely serosangquenous drainage 1/24; the patient continues to do well in terms of the plantar left foot which is continued to contract using silver alginate under the total contact cast HOWEVER the right lateral foot is bigger with denuded skin around the edges. I used pickups and a #15 scalpel to remove this this looks like the remanence of a large blister. Cannot rule out infection. Culture in this area I did last week showed Staphylococcus lugdunensis few colonies. I am going to try to address this with his [employer] antibiotic that is done so well on the left having linezolid and this should cover the staph 2/1; the patient's wound on his left foot which was the original plantar foot wound thick skin and eschar around the  edges even in the total contact cast but the wound surface does not look too bad The real problem is on how his right lateral foot at roughly the base of the fifth metatarsal. The wound is completely necrotic more worrisome than that there is swelling around the edges of this. We have been using silver alginate on both wounds and Keystone on the right foot. Unfortunately I think he is going to require systemic antibiotics while we await cultures. He did not get the x-ray done that we ordered last week [lost the prescription 2/7; disappointingly in the area on the left foot which we are treating with a total contact cast is still not closed although it is much smaller. He continues to have a lot of callus around the wound  edge. -Right lateral foot culture I did last week was negative x-ray also negative for osteomyelitis. 2/15: TCC silver alginate on the left and silver alginate on the right lateral. No real improvement in either area 05/26/2021: T oday, the wounds are roughly the same size as at his previous visit, post-debridement. He continues to endorse fairly substantial drainage, particularly on the right. He has been in a total contact cast on the left. There is still some callus surrounding this lesion. On the right, the periwound skin is Sher, Max ([id number]) [id number] Page 17 of 23 quite macerated, along with surrounding callus. The center of the right-sided wound also has some dark, densely adherent material, which is very difficult to remove. 06/02/2021: Today, both wounds are slightly smaller. He has been using zinc oxide ointment around the right ulcer and the degree of maceration has improved markedly. There continues to be an area of nonviable tissue in the center of the right sided ulcer. The left-sided wound, which has been in the total contact cast. Appears clean and the degree of callus around it is less than previously. 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 He had to take the stairs and both wounds have increased in size. The left foot, which has been in a total contact cast, has developed a tunnel tracking to the lateral aspect of his foot. The nonviable tissue in the center of the right-sided ulcer remains recalcitrant to debridement. There is significant undermining surrounding the entirety of the left sided wound. 06/16/2021: The elevator at school has been fixed and the patient has been able to avoid putting as much weight on his wounds over the past week. We converted the left foot wound into a single lesion today, but despite this, the wound is actually smaller. The base is healthy with limited periwound callus. On the right, the central necrotic area is still present. He continues to be quite macerated around the right sided wound, despite applying barrier cream. This does, however, have the benefit of softening the callus to make it more easily removable. 06/23/2021: Today, the left wound is smaller. The lateral aspect that had opened up previously is now closed. The wound base has a healthy bed of granulation tissue and minimal slough. Unfortunately, on the right, the wound is larger and continues to be fairly macerated. He has also reopened the wound at his right ankle. He thinks this is due to the gait he has adopted secondary to his total contact cast and boot. 06/30/2021: T oday, both wounds are a little bit larger. The lateral aspect on the left has remained closed. He continues to have significant periwound maceration. The culture that I took from the right sided wound grew a population of bacteria that is not covered by his current [facility] antibiotic. The center of the right- sided wound continues to appear necrotic with nonviable fat. It probes deeper today, but does not reach bone. 07/07/2021: The periwound maceration is a little bit less today. The right  lateral foot wound has some areas that appear more viable and the necrotic center also looks a little bit better. The wound on the dorsal surface of his right foot near the ankle is contracting and the surface appears healthy. The left plantar wound surface looks healthy, but there is some new undermining on the medial portion. He did get his new Keystone antibiotic and began applying that to the right foot wound on Saturday. 07/14/2021:  The intake nurse reported substantial drainage from his wounds, but the periwound skin actually looks better than is typical for him. The wound on the dorsal surface of his right foot near the ankle is smaller and just has a small open area underneath some dried eschar. The left plantar wound surface looks healthy and there has been no significant accumulation of callus. The right lateral foot wound looks quite a bit better, with the central portion, which typically appears necrotic, looking more viable albeit pale. 07/22/2021: His left foot is extremely macerated today. The wound is about the same size. The wound on the dorsal surface of his right foot near the ankle had closed, but he traumatized it removing the dressing and there is a tiny skin tear in that location. The right lateral foot wound is bigger, but the surface appears healthy. 07/30/2021: The wound on the dorsal surface of his right foot near the ankle is closed. The right lateral foot wound again is a little bit bigger due to some undermining. The periwound skin is in better condition, however. He has been applying zinc oxide. The wound surface is a little bit dry today. On the left, he does not have the substantial maceration that we frequently see. The wound itself is smaller and has a clean surface. 08/06/2021: Both wounds seem to have deteriorated over the past week. The right lateral foot wound has a dry surface but the periwound is boggy.. Overall wound dimensions are about the same. On the left, the  wound is about the same size, but there is more undermining present underneath periwound callus. 08/13/2021: The right sided wound looks about the same, but on the left there has been substantial deterioration. The undermining continues to extend under periwound callus. Once this was removed, substantial extension of the wound was present. There is no odor or purulent drainage but clearly the wounds have broken down. 08/20/2021: The wounds look about the same today. He has been out of his total contact cast and has just been changing the dressings using topical Keystone with PolyMem Ag, Kerlix and Ace bandages. The wound on the top of his right ankle has reopened but this is quite small. There was a little bit of purulent material that I expressed when examining this wound. 08/24/2021: After the aggressive debridement I performed at his last visit, the wounds actually look a little bit better today. They are smaller with the exception of the wound on the top of his right ankle which is a little bit bigger as some more skin pulled off when he was changing his dressing. We are using topical Keystone with PolyMem Ag Kerlix and Ace bandages. 09/02/2021: There has been really no change to any of his wounds. 09/16/2021: The patient was hospitalized last week with nausea, vomiting, and dehydration. He says that while he was in the hospital, his wounds were not really addressed properly. T oday, both plantar foot wounds are larger and the periwound skin is macerated. The wound on the dorsum of his right foot has a scab on the top. The right foot now has a crater where previously he had had nonviable fat. It looks as though this simply died and fell out. The periwound callus is wet. 09/24/2021: His wounds have deteriorated somewhat since his last visit. The wound on the dorsum of his right foot near his ankle is larger and has more nonviable tissue present. The crater in his right foot is even deeper; I cannot quite  palpate or  probe to bone but I am sure it is close. The wound on his left plantar foot has an odd boggy area in the center that almost feels as though it has fluid within it. He has run out of his topical Keystone antibiotic. We are using silver alginate on his wounds. 09/29/2021: He has developed a new wound on the dorsum of his left foot near his ankle. He says he thinks his wrapping is rubbing in that site. I would concur with this as the wound on his right ankle is larger. The left foot looks about the same. The right foot has the crater that was present last week. No significant slough accumulation, but his foot remains quite swollen and warm despite oral antibiotic therapy. 10/08/2021: All of his wounds look about the same as last week. He did not start his oral antibiotics that are prescribed until just a couple of days ago; his [employer] compounded antibiotics formula has been changed and he is awaiting delivery of the new recipe. His MRI that was scheduled for earlier this week was canceled as no prior authorization had been obtained; unfortunately the tech responsible sent an email to my old [place] email, which I no longer use nor have access to. 10/18/2021: The wounds on his bilateral dorsal feet near the ankles are both improved. They are smaller and have just some eschar and slough buildup. The left plantar wound has a fair amount of undermining, but the surface is clean. There is some periwound callus accumulation. On the right plantar foot, there is nonviable fat leading to a deep tunnel that tracks towards his dorsal medial foot. There is periwound callus and slough accumulation, as well. His right foot and leg remain swollen as compared to the left. 10/25/2021: The wounds on his bilateral dorsal feet and at the ankles have broken down somewhat. They are little bit larger than last week. The left plantar wound continues to undermine laterally but the surface is clean. The right plantar  foot wound shows some decreased depth in the tunnel tracking towards his dorsal medial foot. He has not yet had the Doppler study that I ordered; it sounds like there is some confusion about the scheduling of the procedure. In addition, the MRI was denied and I have taken steps to appeal the denial. 11/24/2021: Since our last visit, Mr. Gallerani was admitted to the hospital where an MRI suggested osteomyelitis. He was taken the operating room by podiatry. Bone biopsies were negative for osteomyelitis. They debrided his wounds and applied myriad matrix. He saw them last week and they removed his staples. He is here today to continue his wound healing process. T oday, both of the dorsal foot/ankle wounds are substantially smaller. There is just a little eschar overlying the left sided wound and some eschar and slough on the right. The right plantar foot ulcer has the healthiest surface of granulation tissue that I have seen to date. A portion of the myriad matrix failed to take and was hanging loose. It appears that myriad morcells were placed into the tunnel closest to Crane, Max ([id number]) [id number] Page 18 of 23 the dorsal portion of his foot. These have sloughed off. The left plantar foot ulcer is about the same size, but has a much healthier surface than in the past. Both plantar ulcers have callus and slough accumulation. 12/07/2021: Left dorsal foot/ankle wound is closed. The right dorsal foot/ankle wound is nearly closed and just has a small open area with some eschar  and slough. The right plantar foot wound has contracted quite a bit since our last visit. It has a healthy surface with just a little bit of slough accumulation and periwound callus buildup. The left plantar foot wound is about the same size but the surface appears healthy. There is a little slough and periwound callus on this side, as well. 12/15/2021: Both dorsal foot/ankle wounds are closed. The  right plantar foot wound is substantially smaller than at our last visit. The tunneling that was present has nearly closed. There is just a little bit of slough buildup. The left plantar foot wound is also a little bit smaller today. The surface is the healthiest that I have ever seen it. Light slough and periwound callus accumulation on this side. 12/22/2021: The dorsal ankle/foot wounds remain closed. The right plantar foot wound continues to contract. There is still a bit of depth at the lateral portion of the wound but the surface has a good granulated appearance. The wound on the left is about the same size, the central indented portion is still adherent. It also is clean with good granulation tissue present. The periwound skin and callus are a bit macerated, however. 12/29/2021: Both ankle wounds remain closed. The right plantar foot wound is less than half the size that it was last week. There is no depth any longer and the surface has just a little bit of slough and periwound callus. On the left, the wound is not much smaller, but the surface is healthier with good granulation tissue. There is also a little slough and periwound callus buildup. 01/05/2022: The right plantar foot wound continues to contract and is once again about half the size as it was last week. There is just a little bit of surface slough and periwound callus. On the left, the depth of the wound has decreased and the diameter has started to contract. It is also very clean with just a little slough and biofilm present. 01/12/2022: The right plantar foot wound is smaller again today. There is just some periwound callus and slough accumulation. On the left, there is a fair amount of undermining and the surface is a little boggy, but no other significant change. 01/19/2022: The right plantar wound continues to contract. There is minimal periwound callus accumulation and just a light layer of slough on the surface. On the left, the  surface looks better, but there is fairly significant undermining near the 11:00 portion of the wound, aiming towards his great toe. The skin overlying this area is very healthy and has a good fat layer present. No malodor or purulent drainage. 01/26/2022: The right sided wound continues to contract and has just a light layer of slough on the surface. Minimal periwound callus. On the left, he still has substantial undermining and the tissue surface is not as robust as I would like to see. No malodor or purulent drainage, but he did say he had a lot of serous drainage over the weekend. 02/02/2022: The right foot wound is about a quarter of the size that it was at his last visit. There is just a little slough on the surface and some periwound callus. On the left, the undermining persists and the tissue is still more pale, but he does not seem to have had as much drainage over the last weekend. We are waiting on a wound VAC. 02/09/2022: His right foot has healed. He received the wound VAC, but did not bring it to clinic with him today. The  lateral aspect of the left foot wound has come in a little, but he still has substantial undermining on the medial and distal portion of the wound. Still with macerated periwound callus. 02/16/2022: The wound VAC was applied last Friday. T oday, there has been tremendous improvement in the surface of the wound bed. The undermined portion of the wound has come in a bit. There is still some overhanging dead skin. Overall the wound looks much better. 02/23/2022: No significant change in the overall wound dimensions, but the wound surface continues to improve and appears healthier and more robust. There is some periwound callus accumulation and overhanging skin. No concern for infection. 03/02/2022: Although the measurements taken in clinic today were unchanged, the visual appearance of the wound looks like it is smaller and the undermining/tunneling is filling with flesh.  There is less periwound tissue maceration than usual. No malodor or purulent drainage. No concern for infection. 03/09/2022: The undermining has come in by couple of millimeters. The periwound is a little bit more macerated this week. No concern for infection. 12/13; substantial wound on the left plantar foot. We are using silver collagen and a wound VAC. 03/23/2022: The undermining continues to contract. The wound surface is a little bit dry. 03/30/2022: The undermining has essentially resolved. There is still some depth at the center of the wound. The wound surface has good beefy tissue and the moisture balance is better. 04/06/2022: The wound dimensions are about the same, except that the depth is beginning to fill in. He has had more drainage this week, but there is no obvious concern for infection. 04/13/2022: He has built up a little bit of callus around the edges of the wound, creating some undermining. Otherwise the wound looks fairly unchanged. 04/20/2022: The wound bed tissue looks very healthy and robust. Light biofilm on the surface. There is some built up wet callus around the wound edges, but no undermining. 04/27/2022: The wound surface continues to look very healthy. Although the wound dimensions measured the same, it looks smaller to me. No real callus accumulation this week. Minimal slough on the surface. 05/04/2022: The wound measured smaller and shallower this week. We are still awaiting insurance approval for Apligraf. 05/11/2022: The wound is about the same in terms of depth but measured a little bit narrower today. He has built up a rim of moist callus around the edges. He has been approved for Apligraf and we will apply that today. 05/18/2022: The wound measured a little bit smaller again today. No significant callus buildup this week. Patient History Information obtained from Patient. Family History No family history of Cancer, Diabetes, Hereditary Spherocytosis, Hypertension,  Kidney Disease, Lung Disease, Seizures, Stroke, Thyroid Problems, Tuberculosis. Social History Never smoker, Marital Status - Single, Alcohol Use - Rarely, Drug Use - No History, Caffeine Use - Never. Medical History Eyes Denies history of Cataracts, Glaucoma, Optic Neuritis Huesca, Max ([id number]) [id number] Page 64 of 23 Ear/Nose/Mouth/Throat Denies history of Chronic sinus problems/congestion, Middle ear problems Hematologic/Lymphatic Denies history of Anemia, Hemophilia, Human Immunodeficiency Virus, Lymphedema, Sickle Cell Disease Respiratory Denies history of Aspiration, Asthma, Chronic Obstructive Pulmonary Disease (COPD), Pneumothorax, Sleep Apnea, Tuberculosis Cardiovascular Denies history of Angina, Arrhythmia, Congestive Heart Failure, Coronary Artery Disease, Deep Vein Thrombosis, Hypertension, Hypotension, Myocardial Infarction, Peripheral Arterial Disease, Peripheral Venous Disease, Phlebitis, Vasculitis Gastrointestinal Denies history of Cirrhosis , Colitis, Crohnoos, Hepatitis A, Hepatitis B, Hepatitis C Endocrine Patient has history of Type II Diabetes Denies history of Type I Diabetes Immunological Denies history of  Lupus Erythematosus, Raynaudoos, Scleroderma Integumentary (Skin) Denies history of History of Burn Musculoskeletal Denies history of Gout, Rheumatoid Arthritis, Osteoarthritis, Osteomyelitis Neurologic Denies history of Dementia, Neuropathy, Quadriplegia, Paraplegia, Seizure Disorder Oncologic Denies history of Received Chemotherapy, Received Radiation Psychiatric Denies history of Anorexia/bulimia, Confinement Anxiety Hospitalization/Surgery History - 11/1-11/06/2018- sepsis foot infection. - 11/4-11/5 02 sats low respiratory distress. Objective Constitutional no acute distress. Vitals Time Taken: 8:23 AM, Height: 77 in, Weight: 280 lbs, BMI: 33.2, Temperature: 98.1 F, Pulse: 96 bpm, Respiratory Rate: 18  breaths/min, Blood Pressure: 136/89 mmHg, Capillary Blood Glucose: 79 mg/dl. General Notes: glucose per pt report last night Respiratory Normal work of breathing on room air. General Notes: 05/18/2022: The wound measured a little bit smaller again today. No significant callus buildup this week. Integumentary (Hair, Skin) Wound #3 status is Open. Original cause of wound was Trauma. The date acquired was: 10/02/2019. The wound has been in treatment 133 weeks. The wound is located on the [place]. The wound measures 2.8cm length x 3cm width x 1.8cm depth; 6.597cm^2 area and 11.875cm^3 volume. There is Fat Layer (Subcutaneous Tissue) exposed. There is no tunneling or undermining noted. There is a medium amount of serosanguineous drainage noted. Foul odor after cleansing was noted. The wound margin is thickened. There is large (67-100%) red granulation within the wound bed. There is a small (1-33%) amount of necrotic tissue within the wound bed including Adherent Slough. The periwound skin appearance had no abnormalities noted for color. The periwound skin appearance exhibited: Callus. The periwound skin appearance did not exhibit: Maceration. Periwound temperature was noted as No Abnormality. 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 There was a Excisional Skin/Subcutaneous Tissue Debridement with a total area of 8.4 sq cm performed by Max Maudlin, MD. With the following instrument(s): Curette to remove Viable and Non-Viable tissue/material. Material removed includes Subcutaneous Tissue and Slough and after achieving pain control using Lidocaine 4% T opical  Solution. No specimens were taken. A time out was conducted at 08:45, prior to the start of the procedure. A Minimum amount of bleeding was controlled with Pressure. The procedure was tolerated well with a pain level of 0 throughout and a pain level of 0 following the procedure. Post Debridement Measurements: 2.8cm length x 3cm width x 1.8cm depth; 11.875cm^3 volume. [place]ton, Max ([id number]) [id number] Page 20 of 23 Character of Wound/Ulcer Post Debridement is improved. Severity of Tissue Post Debridement is: Fat layer exposed. Post procedure Diagnosis Wound #3: Same as Pre-Procedure General Notes: Scribed for Dr. Celine [name] by Max Gouty, RN. Pre-procedure diagnosis of Wound #3 is a Diabetic Wound/Ulcer of the Lower Extremity located on the Left,Lateral,Plantar Foot. A skin graft procedure using a bioengineered skin substitute/cellular or tissue based product was performed by Max Maudlin, MD with the following instrument(s): Forceps and Scissors. Apligraf was applied and secured with Steri-Strips. 44 sq cm of product was utilized and 0 sq cm was wasted due to wound size. Post Application, adaptic, VAC was applied. A Time Out was conducted at 08:48, prior to the start of the procedure. The procedure was tolerated well with a pain level of 0 throughout and a pain level of 0 following the procedure. Post procedure Diagnosis Wound #3: Same as Pre-Procedure . Plan  Follow-up Appointments: Return Appointment in 1 week. - Dr. Celine [name] - Room 1 with [facility] Wednesday 2/21 @ 0815 am Cellular or Tissue Based Products: Cellular or Tissue Based Product Type: - Apligraf #2 Cellular or Tissue Based Product applied to wound bed, secured with steri-strips, cover with Adaptic or Mepitel. (DO NOT REMOVE). - may change black sponge, leave steristrips in place Bathing/ Shower/ Hygiene: May shower and wash wound with soap and water. Negative Presssure Wound Therapy: Wound #3  Left,Lateral,Plantar Foot: Wound Vac to wound continuously at 174m/hg pressure - decrease to 100 mm/hg pressure Black Foam Edema Control - Lymphedema / SCD / Other: Avoid standing for long periods of time. Exercise regularly Compression stocking or Garment 20-30 mm/Hg pressure to: - to both legs daily Off-Loading: Open toe surgical shoe to: - Both feet Additional Orders / Instructions: Follow Nutritious Diet Non Wound Condition: Apply the following to affected area as directed: - continue to apply foam donut or cushion to healed right plantar foot WOUND #3: - Foot Wound Laterality: Plantar, Left, Lateral Cleanser: Soap and Water 3 x Per Week/30 Days Discharge Instructions: May shower and wash wound with dial antibacterial soap and water prior to dressing change. Peri-Wound Care: Skin Prep (Generic) 3 x Per Week/30 Days Discharge Instructions: Use skin prep as directed Prim Dressing: Apligraf 3 x Per Week/30 Days ary Prim Dressing: VAC 3 x Per Week/30 Days ary Secondary Dressing: ADAPTIC TOUCH 3x4.25 in 3 x Per Week/30 Days Discharge Instructions: Apply over primary dressing as directed. Secondary Dressing: Zetuvit Plus 4x8 in (Dispense As Written) 3 x Per Week/30 Days Discharge Instructions: Apply over primary dressing as directed. Secured With: Elastic Bandage 4 inch (ACE bandage) (Generic) 3 x Per Week/30 Days Discharge Instructions: Secure with ACE bandage as directed. Secured With: 57M Medipore SPublic affairs consultantSurgical T 2x10 (in/yd) (Generic) 3 x Per Week/30 Days ape Discharge Instructions: Secure with tape as directed. Com pression Wrap: Kerlix Roll 4.5x3.1 (in/yd) (Generic) 3 x Per Week/30 Days Discharge Instructions: Apply Kerlix and Coban compression as directed. 05/18/2022: The wound measured a little bit smaller again today. No significant callus buildup this week. I used a curette to debride an area of fibrotic-looking subcutaneous tissue. Hemostasis was achieved with silver  nitrate and pressure. I then fenestrated the piece of Apligraf and applied it to the wound, making sure to talk it into all of the next and creatinines. It was secured in place with Adaptic and Steri-Strips. The wound VAC was then applied. Follow-up in 1 week. Electronic Signature(s) Signed: 05/18/2022 9:30:13 AM By: CFredirick MaudlinMD FACS Entered By: CFredirick Maudlinon 05/18/2022 09:30:13 -------------------------------------------------------------------------------- HxROS Details Patient Name: Date of Service: ALewie [name] [name] 05/18/2022 8:15 A M Medical Record Number: [id number] Account Number: [id number] of Birth/Sex: Treating RN: [phone](37y.o. MLayton Podolski CMali([id number] Page 21 of 23 Primary Care Provider: PSeward CarolOther Clinician: Referring Provider: Treating Provider/Extender: COsborn Cohoin Treatment: 179Information Obtained From Patient Eyes Medical History: Negative for: Cataracts; Glaucoma; Optic Neuritis Ear/Nose/Mouth/Throat Medical History: Negative for: Chronic sinus problems/congestion; Middle ear problems Hematologic/Lymphatic Medical History: Negative for: Anemia; Hemophilia; Human Immunodeficiency Virus; Lymphedema; Sickle Cell Disease Respiratory Medical History: Negative for: Aspiration; Asthma; Chronic Obstructive Pulmonary Disease (COPD); Pneumothorax; Sleep Apnea; Tuberculosis Cardiovascular Medical History: Negative for: Angina; Arrhythmia; Congestive Heart Failure; Coronary Artery Disease; Deep Vein Thrombosis; Hypertension; Hypotension; Myocardial Infarction; Peripheral Arterial Disease; Peripheral Venous Disease; Phlebitis; Vasculitis Gastrointestinal Medical History: Negative for: Cirrhosis ; Colitis; Crohns;  Hepatitis A; Hepatitis B; Hepatitis C Endocrine Medical History: Positive for: Type II Diabetes Negative for: Type I Diabetes Time with  diabetes: 8 Treated with: Insulin Blood sugar tested every day: No Immunological Medical History: Negative for: Lupus Erythematosus; Raynauds; Scleroderma Integumentary (Skin) Medical History: Negative for: History of Burn Musculoskeletal Medical History: Negative for: Gout; Rheumatoid Arthritis; Osteoarthritis; Osteomyelitis Neurologic Medical History: Negative for: Dementia; Neuropathy; Quadriplegia; Paraplegia; Seizure Disorder Oncologic Medical History: Negative for: Received Chemotherapy; Received Radiation Psychiatric Medical History: Negative for: Jeri Lager Anxiety Lepage, Max ([id number]) [id number] Page 22 of 23 Immunizations Pneumococcal Vaccine: Received Pneumococcal Vaccination: No Implantable Devices None Hospitalization / Surgery History Type of Hospitalization/Surgery 11/1-11/06/2018- sepsis foot infection 11/4-11/5 02 sats low respiratory distress Family and Social History Cancer: No; Diabetes: No; Hereditary Spherocytosis: No; Hypertension: No; Kidney Disease: No; Lung Disease: No; Seizures: No; Stroke: No; Thyroid Problems: No; Tuberculosis: No; Never smoker; Marital Status - Single; Alcohol Use: Rarely; Drug Use: No History; Caffeine Use: Never; Financial Concerns: No; Food, Clothing or Shelter Needs: No; Support System Lacking: No; Transportation Concerns: No Electronic Signature(s) Signed: 05/18/2022 10:47:57 AM By: Max Maudlin MD FACS Entered By: [name] on 05/18/2022 [id number] -------------------------------------------------------------------------------- SuperBill Details Patient Name: Date of Service: [name], [name] 05/18/2022 Medical Record Number: [id number] Patient Account Number: [id number] Date of Birth/Sex: Treating RN: 06-10-1985 (37 y.o. 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 Modifier: ity Quantity: 1 Physician Procedures : CPT4 Code Description Modifier [id number] - WC PHYS LEVEL 4 - EST PT 25 ICD-10 Diagnosis Description L97.528 Non-pressure chronic ulcer of other part of left foot with other specified severity E11.621 Type 2 diabetes mellitus with foot ulcer Quantity: 1 : [id number] - WC PHYS SKIN SUB GRAFT FACE/NK/HF/G ICD-10 Diagnosis Description L97.528 Non-pressure chronic ulcer of other part of left foot with other specified severity Quantity: 1 Electronic Signature(s) Signed: 05/18/2022 4:48:58 PM By: Max Gouty RN, BSN Signed: 05/19/2022 9:50:30 AM By: Max Maudlin MD FACS Previous Signature: 05/18/2022 9:30:30 AM Version By: Max Maudlin MD Bascom, Max ([id number]) [id number] Page 23 of 23 Previous Signature: 05/18/2022 9:30:30 AM Version By: Max Maudlin MD FACS Entered By: [name] on 05/18/2022 16:48:24

## 2022-05-25 ENCOUNTER — Encounter (HOSPITAL_BASED_OUTPATIENT_CLINIC_OR_DEPARTMENT_OTHER): Payer: BC Managed Care – PPO | Admitting: General Surgery

## 2022-05-25 DIAGNOSIS — E11621 Type 2 diabetes mellitus with foot ulcer: Secondary | ICD-10-CM | POA: Diagnosis not present

## 2022-05-26 NOTE — Progress Notes (Signed)
Max Max ([id number]) [id number] Page 1 of 8 Visit Report for 05/25/2022 Arrival Information Details Patient Name: Date of Service: Max Max 05/25/2022 8:15 A M Medical Record Number: [id number] Patient Account Number: [id number] Date of Birth/Sex: Treating RN: 1985/11/07 (37 y.o. Max Max Primary Care Max Max: Max Max Other Clinician: Referring Max Max: Treating Max Max/Extender: Max Max in Treatment: 74 Visit Information History Since Last Visit Added or deleted any medications: No Patient Arrived: Ambulatory Any new allergies or adverse reactions: No Arrival Time: 08:39 Had a fall or experienced change in No Accompanied By: fiance activities of daily living that may affect Transfer Assistance: None risk of falls: Patient Identification Verified: Yes Signs or symptoms of abuse/neglect since No Secondary Verification Process Completed: Yes last visito Patient Requires Transmission-Based Precautions: No Hospitalized since last visit: No Patient Has Alerts: No Implantable device outside of the clinic No excluding cellular tissue based products placed in the center since last visit: Has Dressing in Place as Prescribed: Yes Has Footwear/Offloading in Place as Yes Prescribed: Left: Surgical Shoe with Pressure Relief Insole Pain Present Now: No Electronic Signature(s) Signed: 05/25/2022 5:50:47 PM By: Max Gouty RN, BSN Entered By: Max Max on 05/25/2022 08:39:55 -------------------------------------------------------------------------------- Encounter Discharge Information Details Patient Name: Date of Service: Max Max, Max Max 05/25/2022 8:15 A M Medical Record Number: [id number] Patient Account Number: [id number] Date of Birth/Sex: Treating RN: 28-May-1985 (37 y.o. Max Max Primary Care Max Max: Max Max Other Clinician: Referring Kamilla Hands: Treating  Max Max/Extender: Max Max in Treatment: 134 Encounter Discharge Information Items Post Procedure Vitals Discharge Condition: Stable Temperature (F): 97.8 Ambulatory Status: Ambulatory Pulse (bpm): 89 Discharge Destination: Home Respiratory Rate (breaths/min): 18 Transportation: Private Auto Blood Pressure (mmHg): 133/87 Accompanied By: fiance Schedule Follow-up Appointment: Yes Clinical Summary of Care: Patient Declined Electronic Signature(s) Signed: 05/25/2022 5:50:47 PM By: Max Gouty RN, BSN Entered By: Max Max on 05/25/2022 11:16:48 [place], [name] ([id number]) [id number] Page 2 of 8 -------------------------------------------------------------------------------- Lower Extremity Assessment Details Patient Name: Date of Service: Max Max 05/25/2022 8:15 A M Medical Record Number: [id number] Patient Account Number: [id number] Date of Birth/Sex: Treating RN: October 02, 1985 (37 y.o. Max Max Primary Care Max Max: Max Max Other Clinician: Referring Max Max: Treating Max Max/Extender: Max Max in Treatment: 134 Edema Assessment Assessed: [name]: No] [Right: No] Edema: [Left: Ye] [Right: s] Calf Left: Right: Point of Measurement: 48 cm From Medial Instep 47 cm Ankle Left: Right: Point of Measurement: 11 cm From Medial Instep 29.5 cm Vascular Assessment Pulses: Dorsalis Pedis Palpable: [Left:Yes] Electronic Signature(s) Signed: 05/25/2022 5:50:47 PM By: Max Gouty RN, BSN Entered By: Max Max on 05/25/2022 08:47:46 -------------------------------------------------------------------------------- Multi Wound Chart Details Patient Name: Date of Service: Max Max, Max Max 05/25/2022 8:15 A M Medical Record Number: [id number] Patient Account Number: [id number] Date of Birth/Sex: Treating RN: 10/18/1985 (37 y.o. 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 Etiology: Extremity Type II Diabetes Comorbid History: 10/02/2019 Date Acquired: 35 Weeks of Treatment:] [N/A:N/A N/A N/A N/A N/A N/A  N/A] Tignor, [name] ([id number]) [3:Open Wound Status: No Wound Recurrence: 2.8x3.1x1.8 Measurements L x W x Max (cm) 6.817 A (cm) :  rea 12.271 Volume (cm) : -313.40% % Reduction in A rea: -7337.00% % Reduction in Volume: Grade 2 Classification: Medium Exudate A mount: Serosanguineous  Exudate Type: red, brown Exudate Color: Yes Foul Odor A Cleansing: fter No Odor A nticipated Due to Product Use: Thickened Wound Margin: Large (67-100%) Granulation A mount: Red Granulation Quality: Small (1-33%) Necrotic A mount: Fat Layer (Subcutaneous  Tissue): Yes N/A Exposed Structures: Tendon: Yes Fascia: No Muscle: No Joint: No Bone: No Small (1-33%) Epithelialization: Debridement - Excisional Debridement: Pre-procedure Verification/Time Out 08:55 Taken: Callus Tissue Debrided: Skin/Subcutaneous  Tissue/Muscle Level: 8.68 Debridement A (sq cm): rea Curette Instrument: Minimum Bleeding: Pressure Hemostasis A chieved: 0 Procedural Pain: 0 Post Procedural Pain: Procedure was tolerated well Debridement Treatment Response: 2.8x3.1x1.8 Post Debridement  Measurements L x W x Max (cm) 12.271 Post Debridement Volume: (cm) Callus: Yes Periwound Skin Texture: Maceration: Yes Periwound Skin Moisture: No Abnormalities Noted Periwound Skin Color: No Abnormality Temperature: Cellular or Tissue Based Product  Procedures Performed:  Debridement Negative Pressure Wound Therapy Maintenance (NPWT)] [N/A:N/A N/A N/A N/A N/A N/A N/A N/A N/A N/A N/A N/A N/A N/A N/A N/A N/A N/A N/A N/A N/A N/A N/A N/A N/A N/A N/A N/A N/A N/A N/A N/A N/A N/A N/A N/A] Treatment Notes Electronic Signature(s) Signed: 05/25/2022 9:15:35 AM By: [name] MD FACS Entered By: [name] on 05/25/2022 09:15:35 -------------------------------------------------------------------------------- Multi-Disciplinary Care Plan Details Patient Name: Date of Service: Max Max, Max Max 05/25/2022 8:15 A M Medical Record Number: [id number] Patient Account Number: [id number] Date of Birth/Sex: Treating RN: 03/29/1986 (37 y.o. Max Max Primary Care Nylani Michetti: Max Max Other Clinician: Referring Sukhman Martine: Treating Liseth Wann/Extender: Max Max in Treatment: Holmes reviewed with physician Active Inactive Nutrition Tant, [name] ([id number]) [id number] Page 4 of 8 Nursing Diagnoses: Imbalanced nutrition Potential for alteratiion in Nutrition/Potential for imbalanced nutrition Goals: Patient/caregiver agrees to and verbalizes understanding of need to use nutritional supplements and/or vitamins as prescribed Date Initiated: 10/24/2019 Date Inactivated: 04/06/2020 Target Resolution Date: 04/03/2020 Goal Status: Met Patient/caregiver will maintain therapeutic glucose control Date Initiated: 10/24/2019 Target Resolution Date: 06/01/2022 Goal Status: Active Interventions: Assess HgA1c results as ordered upon admission and as needed Assess patient nutrition upon admission and as needed per policy Provide education on elevated blood sugars and impact on wound healing Provide education on nutrition Treatment Activities: Education provided on Nutrition : 12/07/2021 Notes: 11/17/20: Glucose control ongoing issue, target date extended. 01/26/21: Glucose management  continues. Wound/Skin Impairment Nursing Diagnoses: Impaired tissue integrity Knowledge deficit related to ulceration/compromised skin integrity Goals: Patient/caregiver will verbalize understanding of skin care regimen Date Initiated: 10/24/2019 Target Resolution Date: 06/01/2022 Goal Status: Active Ulcer/skin breakdown will have a volume reduction of 30% by week 4 Date Initiated: 10/24/2019 Date Inactivated: 01/16/2020 Target Resolution Date: 01/10/2020 Unmet Reason: no change in Goal Status: Unmet measurements. Interventions: Assess patient/caregiver ability to obtain necessary supplies Assess patient/caregiver ability to perform ulcer/skin care regimen upon admission and as needed Assess ulceration(s) every visit Provide education on ulcer and skin care Notes: 11/17/20: Wound care regimen continues Electronic Signature(s) Signed: 05/25/2022 5:50:47 PM By: Max Gouty RN, BSN Entered By: Max Max on 05/25/2022 08:52:09 -------------------------------------------------------------------------------- Negative Pressure Wound Therapy Maintenance (NPWT) Details Patient Name: Date of Service: Max Max 05/25/2022 8:15 A M Medical Record Number: [id number] Patient Account Number: [id number] Date of Birth/Sex: Treating RN: [birth year] (37 y.o. Max Max Primary Care Tracie Lindbloom: Max Max Other Clinician: Referring Donyel Castagnola: Treating Ellamarie Naeve/Extender: Wilfrid Lund  Weeks in Treatment: 134 NPWT Maintenance Performed for: Wound #3 Left, Lateral, Plantar Foot Performed By: Max Gouty, RN Type: VAC System Coverage Size (sq cm): 8.68 Pressure Type: Constant Pressure Setting: 100 mmHG Drain Type: None Primary Contact: Other : apligraf Sponge/Dressing TypeTavish, Mcgoey, [name] ([id number]) [phone].pdf Page 5 of 8 Date Initiated: 02/11/2022 Dressing Removed: No Quantity of Sponges/Gauze Removed:  1 Canister Changed: No Canister Exudate Volume: 10 Dressing Reapplied: No Quantity of Sponges/Gauze Inserted: 1 Respones T Treatment: o good Days On NPWT : 104 Post Procedure Diagnosis Same as Pre-procedure Electronic Signature(s) Signed: 05/25/2022 5:50:47 PM By: Max Gouty RN, BSN Entered By: Max Max on 05/25/2022 09:07:11 -------------------------------------------------------------------------------- Pain Assessment Details Patient Name: Date of Service: Max Max, Max Max 05/25/2022 8:15 A M Medical Record Number: [id number] Patient Account Number: [id number] Date of Birth/Sex: Treating RN: 02-09-1986 (37 y.o. Max Max Primary Care Azarie Coriz: Max Max Other Clinician: Referring Tashawn Laswell: Treating Fatoumata Albaugh/Extender: Max Max in Treatment: 134 Active Problems Location of Pain Severity and Description of Pain Patient Has Paino No Site Locations Rate the pain. Current Pain Level: 0 Pain Management and Medication Current Pain Management: Electronic Signature(s) Signed: 05/25/2022 5:50:47 PM By: Max Gouty RN, BSN Entered By: Max Max on 05/25/2022 [place], [name] ([id number] Page 6 of 8 -------------------------------------------------------------------------------- Patient/Caregiver Education Details Patient Name: Date of Service: Max Max 2/21/2024andnbsp8:15 A M Medical Record Number: [id number] Patient Account Number: [id number] Date of Birth/Gender: Treating RN: Jul 02, 1985 (37 y.o. Max Max Primary Care Physician: Max Max Other Clinician: Referring Physician: Treating Physician/Extender: Max Max in Treatment: 44 Education Assessment Education Provided To: Patient Education Topics Provided Offloading: Methods: Explain/Verbal Responses: Reinforcements needed, State content correctly Wound/Skin  Impairment: Methods: Explain/Verbal Responses: Reinforcements needed, State content correctly Electronic Signature(s) Signed: 05/25/2022 5:50:47 PM By: Max Gouty RN, BSN Entered By: Max Max on 05/25/2022 08:52:40 -------------------------------------------------------------------------------- Wound Assessment Details Patient Name: Date of Service: Max Max, Max Max 05/25/2022 8:15 A M Medical Record Number: [id number] Patient Account Number: [id number] Date of Birth/Sex: Treating RN: [birth year] (37 y.o. Max Max Primary Care Lucious Zou: Max Max Other Clinician: Referring Francenia Chimenti: Treating Marg Macmaster/Extender: Max Max in Treatment: 134 Wound Status Wound Number: 3 Primary Etiology: Diabetic Wound/Ulcer of the Lower Extremity Wound Location: Left, Lateral, Plantar Foot Wound Status: Open Wounding Event: Trauma Comorbid History: Type II Diabetes Date Acquired: 10/02/2019 Weeks Of Treatment: 134 Clustered Wound: No Wound Measurements Length: (cm) 2.8 Width: (cm) 3.1 Depth: (cm) 1.8 Area: (cm) 6.817 Volume: (cm) 12.271 % Reduction in Area: -313.4% % Reduction in Volume: -7337% Epithelialization: Small (1-33%) Tunneling: No Undermining: No Wound Description Classification: Grade 2 Wound Margin: Thickened Exudate Amount: Medium Exudate Type: Serosanguineous Exudate Color: red, brown Foul Odor After Cleansing: Yes Due to Product Use: No Slough/Fibrino Yes Wound Bed Granulation Amount: Large (67-100%) Exposed Structure Granulation Quality: Red Fascia Exposed: No Necrotic Amount: Small (1-33%) Fat Layer (Subcutaneous Tissue) Exposed: Yes Necrotic Quality: Adherent Slough Tendon Exposed: Yes [place], [name] ([id number]) [id number] Page 7 of 8 Muscle Exposed: No Joint Exposed: No Bone Exposed: No Periwound Skin Texture Texture Color No Abnormalities Noted: No No Abnormalities Noted:  Yes Callus: Yes Temperature / Pain Temperature: No Abnormality Moisture No Abnormalities Noted: No Maceration: Yes Treatment Notes Wound #3 (Foot) Wound Laterality: Plantar, Left, Lateral Cleanser Soap and Water Discharge Instruction: May shower and wash wound with dial antibacterial soap and water prior to dressing change. Peri-Wound Care Skin  Prep Discharge Instruction: Use skin prep as directed Topical Primary Dressing Apligraf VAC Secondary Dressing ADAPTIC TOUCH 3x4.25 in Discharge Instruction: Apply over primary dressing as directed. Zetuvit Plus 4x8 in Discharge Instruction: Apply over primary dressing as directed. Secured With Elastic Bandage 4 inch (ACE bandage) Discharge Instruction: Secure with ACE bandage as directed. 56M Medipore Soft Cloth Surgical T 2x10 (in/yd) ape Discharge Instruction: Secure with tape as directed. Compression Wrap Kerlix Roll 4.5x3.1 (in/yd) Discharge Instruction: Apply Kerlix and Coban compression as directed. Compression Stockings Add-Ons Electronic Signature(s) Signed: 05/25/2022 5:50:47 PM By: Max Gouty RN, BSN Entered By: Max Max on 05/25/2022 08:51:39 -------------------------------------------------------------------------------- Vitals Details Patient Name: Date of Service: Max Max, Max Max 05/25/2022 8:15 A M Medical Record Number: [id number] Patient Account Number: [id number] Date of Birth/Sex: Treating RN: [birth year] (37 y.o. Max Max Primary Care Courtnei Ruddell: Max Max Other Clinician: Referring Sylvestre Rathgeber: Treating Kagan Hietpas/Extender: Max Max in Treatment: 134 Vital Signs Time Taken: 08:40 Temperature (F): 97.8 Herzberg, [name] ([id number]) [id number] Page 8 of 8 Height (in): 77 Pulse (bpm): 89 Weight (lbs): 280 Respiratory Rate (breaths/min): 18 Body Mass Index (BMI): 33.2 Blood Pressure (mmHg): 133/87 Capillary Blood Glucose  (mg/dl): 76 Reference Range: 80 - 120 mg / dl Notes glucose per pt report this am Electronic Signature(s) Signed: 05/25/2022 5:50:47 PM By: Max Gouty RN, BSN Entered By: Max Max on 05/25/2022 08:41:03

## 2022-05-26 NOTE — Progress Notes (Signed)
[place], [name] ([id number]) [id number] Page 1 of 23 Visit Report for 05/25/2022 Chief Complaint Document Details Patient Name: Date of Service: [name] D 05/25/2022 8:15 A M Medical Record Number: [id number] Patient Account Number: [id number] Date of Birth/Sex: Treating RN: 06/29/1985 (37 y.o. M) Primary Care Provider: [name] Other Clinician: Referring Provider: Treating Provider/Extender: [name] in Treatment: Cesar Chavez from: Patient Chief Complaint 01/11/2019; patient is here for review of a rather substantial wound over the left fifth plantar metatarsal head extending into the lateral part of his foot 10/24/2019; patient returns to clinic with wounds on his bilateral feet with underlying osteomyelitis biopsy-proven Electronic Signature(s) Signed: 05/25/2022 9:17:02 AM By: [name] MD FACS Entered By: [name] on 05/25/2022 09:17:02 -------------------------------------------------------------------------------- Cellular or Tissue Based Product Details Patient Name: Date of Service: [name], [name] 05/25/2022 8:15 A M Medical Record Number: [id number] Patient Account Number: [id number] Date of Birth/Sex: Treating RN: 1985-11-04 (37 y.o. [name] Primary Care Provider: [name] Other Clinician: Referring Provider: Treating Provider/Extender: [name] in Treatment: 134 Cellular or Tissue Based Product Type Wound #3 Left,Lateral,Plantar Foot Applied to: Performed By: Physician [name], MD Cellular or Tissue Based Product Type: Apligraf Level of Consciousness (Pre-procedure): Awake and Alert Pre-procedure Verification/Time Out Yes - 09:05 Taken: Location: genitalia / hands / feet / multiple digits Wound Size (sq cm): 8.68 Product Size (sq cm): 44 Waste Size (sq cm): 2 Waste Reason: wound size Amount of Product Applied (sq  cm): 42 Instrument Used: Forceps, Scissors Lot #: [id number] Order #: 3 Expiration Date: 05/27/2022 Fenestrated: Yes Instrument: Blade Reconstituted: Yes Solution Type: saline Solution Amount: 10 ml Lot #: [id number] Solution Expiration Date: 09/01/2024 Secured: Yes Secured With: Steri-Strips Dressing Applied: Yes Primary Dressing: adaptic, VAC Procedural Pain: 0 [place], [name] ([id number] Page 2 of 23 Post Procedural Pain: 0 Response to Treatment: Procedure was tolerated well Level of Consciousness (Post- Awake and Alert procedure): Post Procedure Diagnosis Same as Pre-procedure Electronic Signature(s) Signed: 05/25/2022 10:33:46 AM By: [name] MD FACS Signed: 05/25/2022 5:50:47 PM By: Baruch Gouty RN, BSN Entered By: Baruch Gouty on 05/25/2022 09:14:34 -------------------------------------------------------------------------------- Debridement Details Patient Name: Date of Service: [name], [name] 05/25/2022 8:15 A M Medical Record Number: [id number] Patient Account Number: [id number] Date of Birth/Sex: Treating RN: [birth year] (37 y.o. [name] Primary Care Provider: [name] Other Clinician: Referring Provider: Treating Provider/Extender: [name] in Treatment: 134 Debridement Performed for Assessment: Wound #3 Left,Lateral,Plantar Foot Performed By: Physician [name], MD Debridement Type: Debridement Severity of Tissue Pre Debridement: Muscle involvement without necrosis Level of Consciousness (Pre-procedure): Awake and Alert Pre-procedure Verification/Time Out Yes - 08:55 Taken: Start Time: 08:56 T Area Debrided (L x W): otal 2.8 (cm) x 3.1 (cm) = 8.68 (cm) Tissue and other material debrided: Viable, Non-Viable, Callus, Fascia , Skin: Epidermis, Biofilm Level: Skin/Subcutaneous Tissue/Muscle Debridement Description: Excisional Instrument:  Curette Bleeding: Minimum Hemostasis Achieved: Pressure Procedural Pain: 0 Post Procedural Pain: 0 Response to Treatment: Procedure was tolerated well Level of Consciousness (Post- Awake and Alert procedure): Post Debridement Measurements of Total Wound Length: (cm) 2.8 Width: (cm) 3.1 Depth: (cm) 1.8 Volume: (cm) 12.271 Character of Wound/Ulcer Post Debridement: Improved Severity of Tissue Post Debridement: Fat layer exposed Post Procedure Diagnosis Same as Pre-procedure Notes Scribed for Dr. [name] by Baruch Gouty, RN Electronic Signature(s) Signed: 05/25/2022 10:33:46 AM By: [name] MD FACS Signed: 05/25/2022 5:50:47 PM By: Baruch Gouty  RN, BSN Entered By: Baruch Gouty on 05/25/2022 09:05:10 [place], [name] ([id number]) [id number] Page 3 of 23 -------------------------------------------------------------------------------- HPI Details Patient Name: Date of Service: [name] D 05/25/2022 8:15 A M Medical Record Number: [id number] Patient Account Number: [id number] Date of Birth/Sex: Treating RN: 1985/06/08 (37 y.o. 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 He comes in for review of a wound over the plantar fifth metatarsal head extending into the lateral part of the foot. He was followed for this previously by his podiatrist Dr. Cornelius Moras. As the patient tells his story he went to see podiatry first for a swelling he developed on the lateral part of his fifth metatarsal head in May. He states this was "open" by podiatry and the area closed. He was followed up in June and it was again opened callus removed and it closed promptly. There were plans being made for  surgery on the fifth metatarsal head in June however his blood sugar was apparently too high for anesthesia. Apparently the area was debrided and opened again in June and it is never closed since. Looking over the records from podiatry I am really not able to follow this. It was clear when he was first seen it was before 5/14 at that point he already had a wound. By 5/17 the ulcer was resolved. I do not see anything about a procedure. On 5/28 noted to have pre-ulcerative moderate keratosis. X-ray noted 1/5 contracted toe and tailor's bunion and metatarsal deformity. On a visit date on 09/28/2018 the dorsal part of the left foot it healed and resolved. There was concern about swelling in his lower extremity he was sent to the ER.. As far as I can tell he was seen in the ER on 7/12 with an ulcer on his left foot. A DVT rule out of the left leg was negative. I do not think I have complete records from podiatry but I am not able to verify the procedures this patient states he had. He states after the last procedure the wound has never closed although I am not able to follow this in the records I have from podiatry. He has not had a recent x-ray The patient has been using Neosporin on the wound. He is wearing a Darco shoe. He is still very active up on his foot working and exercising. Past medical history; type 2 diabetes ketosis-prone, leg swelling with a negative DVT study in July. Non-smoker ABI in our clinic was 0.85 on the left 10/16; substantial wound on the plantar left fifth met head extending laterally almost to the dorsal fifth MTP. We have been using silver alginate we gave him a Darco forefoot off loader. An x-ray did not show evidence of osteomyelitis did note soft tissue emphysema which I think was due to gas tracking through an open wound. There is no doubt in my mind he requires an MRI 10/23; MRI not booked until 3 November at the earliest this is largely due to his glucose sensor in the  right arm. We have been using silver alginate. There has been an improvement 10/29; I am still not exactly sure when his MRI is booked for. He says it is the third but it is the 10th in epic. This definitely needs to be done. He is running  a low-grade fever today but no other symptoms. No real improvement in the 1 02/26/2019 patient presents today for a follow-up visit here in our clinic he is last been seen in the clinic on October 29. Subsequently we were working on getting MRI to evaluate and see what exactly was going on and where we would need to go from the standpoint of whether or not he had osteomyelitis and again what treatments were going be required. Subsequently the patient ended up being admitted to the hospital on 02/07/2019 and was discharged on 02/14/2019. This is a somewhat interesting admission with a discharge diagnosis of pneumonia due to COVID-19 although he was positive for COVID-19 when tested at the urgent care but negative x2 when he was actually in the hospital. With that being said he did have acute respiratory failure with hypoxia and it was noted he also have a left foot ulceration with osteomyelitis. With that being said he did require oxygen for his pneumonia and I level 4 L. He was placed on antivirals and steroids for the COVID-19. He was also transferred to the [place] at one point. Nonetheless he did subsequently discharged home and since being home has done much better in that regard. The CT angiogram did not show any pulmonary embolism. With regard to the osteomyelitis the patient was placed on vancomycin and Zosyn while in the hospital but has been changed to Augmentin at discharge. It was also recommended that he follow- up with wound care and podiatry. Podiatry however wanted him to see [name] according to the patient prior to them doing anything further. His hemoglobin A1c was 9.9 as noted in the hospital. Have an MRI of the left foot performed while in  the hospital on 02/04/2019. This showed evidence of septic arthritis at the fifth MTP joint and osteomyelitis involving the fifth metatarsal head and proximal phalanx. There is an overlying plantar open wound noted an abscess tracking back along the lateral aspect of the fifth metatarsal shaft. There is otherwise diffuse cellulitis and mild fasciitis without findings of polymyositis. The patient did have recently pneumonia secondary to COVID-19 I looked in the chart through epic and it does appear that the patient may need to have an additional x-ray just to ensure everything is cleared and that he has no airspace disease prior to putting him into the [name]. 03/05/2019; patient was readmitted to the clinic last week. He was hospitalized twice for a viral upper respiratory tract infection from 11/1 through 11/4 and then 11/5 through 11/12 ultimately this turned out to be Covid pneumonitis. Although he was discharged on oxygen he is not using it. He says he feels fine. He has no exercise limitation no cough no sputum. His O2 sat in our clinic today was 100% on room air. He did manage to have his MRI which showed septic arthritis at the fifth MTP joint and osteomyelitis involving the fifth metatarsal head and proximal phalanx. He received Vanco and Zosyn in the hospital and then was discharged on 2 weeks of Augmentin. I do not see any relevant cultures. He was supposed to follow-up with infectious disease but I do not see that he has an appointment. 12/8; patient saw Dr. Novella Olive of infectious disease last week. He felt that he had had adequate antibiotic therapy. He did not go to follow-up with Dr. Amalia Hailey of podiatry and I have again talked to him about the pros and cons of this. He does not want to consider a ray amputation  of this time. He is aware of the risks of recurrence, migration etc. He started HBO today and tolerated this well. He can complete the Augmentin that I gave him last week. I have looked  over the lab work that Dr. Chana Bode ordered his C-reactive protein was 3.3 and his sedimentation rate was 17. The C-reactive protein is never really been measurably that high in this patient 12/15; not much change in the wound today however he has undermining along the lateral part of the foot again more extensively than last week. He has some rims of epithelialization. We have been using silver alginate. He is undergoing hyperbarics but did not dive today 12/18; in for his obligatory first total contact cast change. Unfortunately there was pus coming from the undermining area around his fifth metatarsal head. This was cultured but will preclude reapplication of a cast. He is seen in conjunction with HBO 12/24; patient had staph lugdunensis in the wound in the undermining area laterally last time. We put him on doxycycline which should have covered this. The wound looks better today. I am going to give him another week of doxycycline before reattempting the total contact cast 12/31; the patient is completing antibiotics. Hemorrhagic debris in the distal part of the wound with some undermining distally. He also had hyper granulation. Extensive debridement with a #5 curette. The infected area that was on the lateral part of the fifth met head is closed over. I do not think he needs any more antibiotics. Patient was seen prior to HBO. Preparations for a total contact cast were made in the cast will be placed post hyperbarics 04/11/19; once again the patient arrives today without complaint. He had been in a cast all week noted that he had heavy drainage this week. This resulted in large raised areas of macerated tissue around the wound 1/14; wound bed looks better slightly smaller. Hydrofera Blue has been changing himself. He had a heavy drainage last week which caused a lot of maceration [place], [name] ([id number]) [phone].pdf Page 4 of 23 around the wound so I took him out of a  total contact cast he says the drainage is actually better this week He is seen today in conjunction with HBO 1/21; returns to clinic. He was up in [place] for a day or 2 attending a funeral. He comes back in with the wound larger and with a large area of exposed bone. He had osteomyelitis and septic arthritis of the fifth left metatarsal head while he was in hospital. He received IV antibiotics in the hospital for a prolonged period of time then 3 weeks of Augmentin. Subsequently I gave him 2 weeks of doxycycline for more superficial wound infection. When I saw this last week the wound was smaller the surface of the wound looks satisfactory. 1/28; patient missed hyperbarics today. Bone biopsy I did last time showed Enterococcus faecalis and Staphylococcus lugdunensis . He has a wide area of exposed bone. We are going to use silver alginate as of today. I had another ethical discussion with the patient. This would be recurrent osteomyelitis he is already received IV antibiotics. In this situation I think the likelihood of healing this is low. Therefore I have recommended a ray amputation and with the patient's agreement I have referred him to Dr. Doran Durand. The other issue is that his compliance with hyperbarics has been minimal because of his work schedule and given his underlying decision I am going to stop this today READMISSION 10/24/2019 MRI 09/29/2019 left foot  IMPRESSION: 1. Apparent skin ulceration inferior and lateral to the 5th metatarsal base with underlying heterogeneous T2 signal and enhancement in the subcutaneous fat. Small peripherally enhancing fluid collections along the plantar and lateral aspects of the 5th metatarsal base suspicious for abscesses. 2. Interval amputation through the mid 5th metatarsal with nonspecific low-level marrow edema and enhancement. Given the proximity to the adjacent soft tissue inflammatory changes, osteomyelitis cannot be excluded. 3. The  additional bones appear unremarkable. MRI 09/29/2019 right foot IMPRESSION: 1. Soft tissue ulceration lateral to the 5th MTP joint. There is low-level T2 hyperintensity within the 4th and 5th metatarsal heads and adjacent proximal phalanges without abnormal T1 signal or cortical destruction. These findings are nonspecific and could be seen with early marrow edema, hyperemia or early osteomyelitis. No evidence of septic joint. 2. Mild tenosynovitis and synovial enhancement associated with the extensor digitorum tendons at the level of the midfoot. 3. Diffuse low-level muscular T2 hyperintensity and enhancement, most consistent with diabetic myopathy. LEFT FOOT BONE Methicillin resistant staphylococcus aureus Staphylococcus lugdunensis MIC MIC CIPROFLOXACIN >=8 RESISTANT Resistant <=0.5 SENSI... Sensitive CLINDAMYCIN <=0.25 SENS... Sensitive >=8 RESISTANT Resistant ERYTHROMYCIN >=8 RESISTANT Resistant >=8 RESISTANT Resistant GENTAMICIN <=0.5 SENSI... Sensitive <=0.5 SENSI... Sensitive Inducible Clindamycin NEGATIVE Sensitive NEGATIVE Sensitive OXACILLIN >=4 RESISTANT Resistant 2 SENSITIVE Sensitive RIFAMPIN <=0.5 SENSI... Sensitive <=0.5 SENSI... Sensitive TETRACYCLINE <=1 SENSITIVE Sensitive <=1 SENSITIVE Sensitive TRIMETH/SULFA <=10 SENSIT Sensitive <=10 SENSIT Sensitive ... Marland Kitchen.. VANCOMYCIN 1 SENSITIVE Sensitive <=0.5 SENSI... Sensitive Right foot bone . Component 3 wk ago Specimen Description BONE Special Requests RIGHT 4 METATARSAL SAMPLE B Gram Stain NO WBC SEEN NO ORGANISMS SEEN Culture RARE METHICILLIN RESISTANT STAPHYLOCOCCUS AUREUS NO ANAEROBES ISOLATED Performed at [facility], [place] [address], [place], [place] [address] Report Status 10/08/2019 FINAL Organism ID, Bacteria METHICILLIN RESISTANT STAPHYLOCOCCUS AUREUS Resulting Agency [facility] Susceptibility Methicillin resistant staphylococcus aureus MIC CIPROFLOXACIN >=8 RESISTANT Resistant CLINDAMYCIN  <=0.25 SENS... Sensitive ERYTHROMYCIN >=8 RESISTANT Resistant GENTAMICIN <=0.5 SENSI... Sensitive Inducible Clindamycin NEGATIVE Sensitive OXACILLIN >=4 RESISTANT Resistant RIFAMPIN <=0.5 SENSI... Sensitive TETRACYCLINE <=1 SENSITIVE Sensitive TRIMETH/SULFA <=10 SENSIT Sensitive ... VANCOMYCIN 1 SENSITIVE Sensitive This is a patient we had in clinic earlier this year with a wound over his left fifth metatarsal head. 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 Doran Durand on 05/16/2019. He seems to have developed problems with wounds on his bilateral feet in June 2021 although he may have had problems earlier than this. He was in an urgent care with a right foot ulcer on 09/26/2019 and given a course of doxycycline. This was apparently after having trouble getting into see orthopedics. He was seen by podiatry on 09/28/2019 noted to have bilateral lower extremity ulcers including the left lateral fifth metatarsal base and the right subfifth met head. It was noted that had purulent drainage at that time. He required hospitalization from 6/20 through 7/2. This was because of worsening right foot wounds. He underwent bilateral operative incision and drainage and bone biopsies bilaterally. Culture results are listed above. He has been referred back to clinic by Dr. Jacqualyn Posey of podiatry. He is also followed by Dr. Megan Salon who saw him yesterday. He was discharged from hospital on Zyvox Flagyl and Levaquin and  yesterday changed to doxycycline Flagyl and Levaquin. His inflammatory markers on 6/26 showed a sedimentation rate of 129 and a C-reactive protein  of 5. This is improved to 14 and 1.3 respectively. This would indicate improvement. ABIs in our clinic today were 1.23 on the right and 1.20 on the left 11/01/2019 on evaluation today patient appears to be doing fairly well in regard to the wounds on his feet at this point. Fortunately there is no signs of active infection at this time. No fevers, chills, nausea, vomiting, or diarrhea. He currently is seeing infectious disease and still under their care at this point. Subsequently he also has both wounds which she has not been using collagen on as he did not receive that in his packaging he did not call us and let us know that. Apparently that just was missed on the order. Nonetheless we will get that straightened out today. 8/9-Patient returns for bilateral foot wounds, using Prisma with hydrogel moistened dressings, and the wounds appear stable. Patient using surgical shoes, avoiding much pressure or weightbearing as much as possible 8/16; patient has bilateral foot wounds. 1 on the right lateral foot proximally the other is on the left mid lateral foot. Both required debridement of callus and thick skin around the wounds. We have been using silver collagen 8/27; patient has bilateral lateral foot wounds. The area on the left substantially surrounded by callus and dry skin. This was removed from the wound edge. The underlying wound is small. The area on the right measured somewhat smaller today. 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 Unfortunately I did not update his antibiotics during today's visit. 9/10 I reviewed Dr. Hale Bogus last notes he felt he had completed antibiotics his inflammatory markers were reasonably well controlled. He has a small wound on the lateral left foot and a tiny area on the right which is just above closed. He is using Hydrofera Blue with border foam he has bilateral surgical shoes 9/24; 2 week f/u.  doing well. right foot is closed. left foot still undermined. 10/14; right foot remains closed at the fifth met head. The area over the base of the left fifth metatarsal has a small open area but considerable undermining towards the plantar foot. Thick callus skin around this suggests an adequate pressure relief. We have talked about this. He says he is going to go back into his cam boot. I suggested a total contact cast he did not seem enamored with this suggestion 10/26; left foot base of the fifth metatarsal. Same condition as last time. He has skin over the area with an open wound however the skin is not adherent. He went to see Dr. Earleen Newport who did an x-ray and culture of his foot I have not reviewed the x-ray but the patient was not told anything. He is on doxycycline 11/11; since the patient was last here he was in the emergency room on 10/30 he was concerned about swelling in the left foot. They did not do any cultures or x-rays. They changed his antibiotics to cephalexin. Previous culture showed group B strep. The cephalexin is appropriate as doxycycline has less than predictable coverage. Arrives in clinic today with swelling over this area under the wound. He also has a new wound on the right fifth metatarsal head 11/18; the patient has a difficult wound on the lateral aspect of the left fifth metatarsal head. The wound was almost ballotable last week I opened  it slightly expecting to see purulence however there was just bleeding. I cultured this this was negative. X-ray unchanged. We are trying to get an MRI but I am not sure were going to be able to get this through his insurance. He also has an area on the right lateral fifth metatarsal head this looks healthier 12/3; the patient finally got our MRI. Surprisingly this did not show osteomyelitis. I did show the soft tissue ulceration at the lateral plantar aspect of the fifth metatarsal base with a tiny residual 6 mm abscess overlying the  superficial fascia I have tried to culture this area I have not been able to get this to grow anything. Nevertheless the protruding tissue looks aggravated. I suspect we should try to treat the underlying "abscess with broad-spectrum antibiotics. I am going to start him on Levaquin and Flagyl. He has much less edema in his legs and I am going to continue to wrap his legs and see him weekly 12/10. I started Levaquin and Flagyl on him last week. He just picked up the Flagyl apparently there was some delay. The worry is the wound on the left fifth metatarsal base which is substantial and worsening. His foot looks like he inverts at the ankle making this a weightbearing surface. Certainly no improvement in fact I think the measurements of this are somewhat worse. We have been using 12/17; he apparently just got the Levaquin yesterday this is 2 weeks after the fact. He has completed the Flagyl. The area over the left fifth metatarsal base still has protruding granulation tissue although it does not look quite as bad as it did some weeks ago. He has severe bilateral lymphedema although we have not been treating him for wounds on his legs this is definitely going to require compression. There was so much edema in the left I did not wish to put him in a total contact cast today. I am going to increase his compression from 3-4 layer. The area on the right lateral fifth met head actually look quite good and superficial. 12/23; patient arrived with callus on the right fifth met head and the substantial hyper granulated callused wound on the base of his fifth metatarsal. He says he is completing his Levaquin in 2 days but I do not think that adds up with what I gave him but I will have to double check this. We are using Hydrofera Blue on both areas. My plan is to put the left leg in a cast the week after New Year's 04/06/2020; patient's wounds about the same. Right lateral fifth metatarsal head and left lateral  foot over the base of the fifth metatarsal. There is undermining on the left lateral foot which I removed before application of total contact cast continuing with Hydrofera Blue new. Patient tells me he was seen by endocrinology today lab work was done [Dr. Kerr]. Also wondering whether he was referred to cardiology. I went over some lab work from previously does not have chronic renal failure certainly not nephrotic range proteinuria he does have very poorly controlled diabetes but this is not his most updated lab work. Hemoglobin A1c has been over 11 1/10; the patient had a considerable amount of leakage towards mid part of his left foot with macerated skin however the wound surface looks better the area on the right lateral fifth met head is better as well. I am going to change the dressing on the left foot under the total contact cast to silver alginate,  continue with Hydrofera Blue on the right. 1/20; patient was in the total contact cast for 10 days. Considerable amount of drainage although the skin around the wound does not look too bad on the left foot. The area on the right fifth metatarsal head is closed. Our nursing staff reports large amount of drainage out of the left lateral foot wound 1/25; continues with copious amounts of drainage described by our intake staff. PCR culture I did last week showed E. coli and Enterococcus faecalis and low quantities. Multiple resistance genes documented including extended spectrum beta lactamase, MRSA, MRSE, quinolone, tetracycline. The wound is not quite as good this week as it was 5 days ago but about the same size 2/3; continues with copious amounts of malodorous drainage per our intake nurse. The PCR culture I did 2 weeks ago showed E. coli and low quantities of Enterococcus. There were multiple resistance genes detected. I put Neosporin on him last week although this does not seem to have helped. The wound is slightly deeper today. Offloading  continues to be an issue here although with the amount of drainage she has a total contact cast is just not going to work 2/10; moderate amount of drainage. Patient reports he cannot get his stocking on over the dressing. I told him we have to do that the nurse gave him suggestions on how to make this work. The wound is on the bottom and lateral part of his left foot. Is cultured predominantly grew low amounts of Enterococcus, E. coli and anaerobes. There were multiple resistance genes detected including extended spectrum beta lactamase, quinolone, tetracycline. I could not think of an easy oral combination to address this so for now I am going to do topical antibiotics provided by [facility] I think the main agents here are vancomycin and an aminoglycoside. We have to be able to give him access to the wounds to get the topical antibiotic on 2/17; moderate amount of drainage this is unchanged. He has his Keystone topical antibiotic against the deep tissue culture organisms. He has been using this and changing the dressing daily. Silver alginate on the wound surface. 2/24; using Keystone antibiotic with silver alginate on the top. He had too much drainage for a total contact cast at one point although I think that is improving and I think in the next week or 2 it might be possible to replace a total contact cast I did not do this today. In general the wound surface looks healthy however he continues to have thick rims of skin and subcutaneous tissue around the wide area of the circumference which I debrided 06/04/2020 upon evaluation today patient appears to be doing well in regard to his wound. I do feel like he is showing signs of improvement. There is little bit of callus and dead tissue around the edges of the wound as well as what appears to be a little bit of a sinus tract that is off to the side laterally I would perform debridement to clear that away today. 3/17; left lateral foot. The wound looks  about the same as I remember. Not much depth surface looks healthy. No evidence of infection 3/25; left lateral foot. Wound surface looks about the same. Separating epithelium from the circumference. There really is no evidence of infection here however not making progress by my view [place], [name] ([id number]) [id number] Page 6 of 23 3/29; left lateral foot. Surface of the wound again looks reasonably healthy still thick skin and subcutaneous tissue around  the wound margins. There is no evidence of infection. One of the concerns being brought up by the nurses has again the amount of drainage vis--vis continued use of a total contact cast 4/5; left lateral foot at roughly the base of the fifth metatarsal. Nice healthy looking granulated tissue with rims of epithelialization. The overall wound measurements are not any better but the tissue looks healthy. The only concern is the amount of drainage although he has no surrounding maceration with what we have been doing recently to absorb fluid and protect his skin. He also has lymphedema. 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 He states that the kerlix/coban under the TCC rolled down and could not get it back up. He has been using an offloading scooter and has somehow hurt his right foot using this device. This happened last week. He states that the side of his right foot developed a blister and opened. The top of his foot also has a few small open wounds he thinks is due to his socks rubbing in his shoes. He has not been using any dressings to the wound. He denies purulent drainage, fever/chills or erythema to the wounds. 4/22; patient presents for 1 week follow-up. He  developed new wounds to the right foot that were evaluated at last clinic visit. He continues to have a total contact cast to the left leg and he reports no issues. He has been using silver collagen to the right foot wounds with no issues. He denies purulent drainage, fever/chills or erythema to the right foot wounds. He has no complaints today 4/25; patient presents for 1 week follow-up. He has a total contact cast of the left leg and reports no issues. He has been using silver alginate to the right foot wound. He denies purulent drainage, fever/chills or erythema to the right foot wounds. 5/2 patient presents for 1 week follow-up. T contact cast on the left. The wound which is on the base of the plantar foot at the base of the fifth metatarsal otal actually looks quite good and dimensions continue to gradually contract. HOWEVER the area on the right lateral fifth metatarsal head is much larger than what I remember from 2 weeks ago. Once more is he has significant levels of hypergranulation. Noteworthy that he had this same hyper granulated response on his wound on the left foot at one point in time. So much so that he I thought there was an underlying fluid collection. Based on this I think this just needs debridement. 5/9; the wound on the left actually continues to be gradually smaller with a healthy surface. Slight amount of drainage and maceration of the skin around but not too bad. However he has a large wound over the right fifth metatarsal head very much in the same configuration as his left foot wound was initially. I used silver nitrate to address the hyper granulated tissue no mechanical debridement 5/16; area on the left foot did not look as healthy this week deeper thick surrounding macerated skin and subcutaneous tissue. The area on the right foot fifth met head was about the same The area on the right ankle that we identified last week is completely broken down into an open wound  presumably  a stocking rubbing issue 5/23; patient has been using a total contact cast to the left side. He has been using silver alginate underneath. He has also been using silver alginate to the right foot wounds. He has no complaints today. He denies any signs of infection. 5/31; the left-sided wound looks some better measure smaller surface granulation looks better. We have been using silver alginate under the total contact cast The large area on his right fifth met head and right dorsal foot look about the same still using silver alginate 6/6; neither side is good as I was hoping although the surface area dimensions are better. A lot of maceration on his left and right foot around the wound edge. Area on the dorsal right foot looks better. He says he was traveling. I am not sure what does the amount of maceration around the plantar wounds may be drainage issues 6/13; in general the wound surfaces look quite good on both sides. Macerated skin and raised edges around the wound required debridement although in general especially on the left the surface area seems improved. The area on the right dorsal ankle is about the same I thought this would not be such a problem to close 6/20; not much change in either wound although the one on the right looks a little better. Both wounds have thick macerated edges to the skin requiring debridements. We have been using silver alginate. The area on the dorsal right ankle is still open I thought this would be closed. 6/28; patient comes in today with a marked deterioration in the right foot wound fifth met head. Wide area of exposed bone this is a drastic change from last time. The area on the left there we have been casting is stagnant. We have been using silver alginate in both wound areas. 7/5; bone culture I did for PCR last time was positive for Pseudomonas, group B strep, Enterococcus and Staph aureus. There was no suggestion of methicillin resistance or  ampicillin resistant genes. This was resistant to tetracycline however He comes into the clinic today with the area over his right plantar fifth metatarsal head which had been doing so well 2 weeks ago completely necrotic feeling bone. I do not know that this is going to be salvageable. The left foot wound is certainly no smaller but it has a better surface and is superficial. 7/8; patient called in this morning to say that his total contact cast was rubbing against his foot. He states he is doing fine overall. He denies signs of infection. 7/12; continued deterioration in the wound over the right fifth metatarsal head crumbling bone. This is not going to be salvageable. The patient agrees and wants to be referred to Dr. Doran Durand which we will attempt to arrange as soon as possible. I am going to continue him on antibiotics as long as that takes so I will renew those today. The area on the left foot which is the base of the fifth metatarsal continues to look somewhat better. Healthy looking tissue no depth no debridement is necessary here. 7/20; the patient was kindly seen by Dr. Doran Durand of orthopedics on 10/19/2020. 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 Towards this end we have taken him out of the total contact cast on the left we will put him in a wrap with Hydrofera Blue. As I understand things surgery is planned  for 7/21 7/27; patient had his surgery last Thursday. 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 Apparently everything went well we did not still disturb that today The area on the left foot actually looks quite good. He has been much less mobile which probably explains this he did not seem to do well in the total contact cast secondary to drainage and maceration I think. 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 Fortunately there is no evidence of active infection at this time. No fevers, chills, nausea, vomiting, or diarrhea. Overall I think that he is actually doing extremely well. Nonetheless I do believe that he is staying off of this more following the surgery in his right foot that is the reason the left is doing so great. 8/16; left plantar foot wound. This looks smaller than the last time I saw this he is using Hydrofera Blue. The surgical wound on the right foot is being followed by Dr. Doran Durand we did not look at this today. He has surgical shoes on both feet 8/23; left plantar foot wound not as good this week. Surrounding macerated skin and subcutaneous tissue everything looks moist and wet. I do not think he is offloading this adequately. He is using a surgical shoe Apparently the right foot surgical wound is not open although I did not check his foot 8/31; left plantar foot lateral aspect. Much improved this week. He has no maceration. Some improvement in the surface area of the wound but most impressively the depth is come in we are using silver alginate. The patient is a Product/process development scientist. He is asked that we write him a letter so he can go back to work. I have also tried to see if we can write something that will allow him to limit the amount of time that he is on his foot at work. 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 Hopefully the latter can be avoided 9/6; I believe the patient missed an appointment last week. He arrives in today with a wound looking roughly the same certainly no better. Undermining laterally [place], [name] ([id number]) [id number] Page 7 of 23 and also inferiorly. We used molecuLight today in training with the patient's permission.. We are using silver alginate 9/21 wound is measuring bigger this week although this may have to do  with the aggressive circumferential debridement last week in response to the blush fluorescence on the MolecuLight. Culture I did last week showed significant MSSA and E. coli. I put him on Augmentin but he has not started it yet. We are also going to send this for compounded antibiotics at [facility]. There is no evidence of systemic infection 9/29; silver alginate. His Keystone arrived. He is completing Augmentin in 2 days. Offloading in a cam boot. Moderate drainage per our intake staff 10/5; using silver alginate. He has been using his [place]. He has completed his Augmentin. Per our intake nurse still a lot of drainage, far too much to consider a total contact cast. Wound measures about the same. He had the same undermining area that I defined last week from a roughly 11-3. I remove this today 10/12; using silver alginate he is using the [place]. He comes in for a nurse visit hence we are applying [employer] twice a week. Measuring slightly better today  and less notable drainage. Extensive debridement of the wound edge last time 10/18; using topical Keystone and silver alginate and a soft cast. Wound measurements about the same. Drainage was through his soft cast. We are changing this twice a week Tuesdays and Friday 10/25; comes in with moderate drainage. Still using Keystone silver alginate and a soft cast. Wound dimensions completely the same.He has a lot of edema in the left leg he has lymphedema. Asking for [name] to consider wrapping him as he cannot get his stocking on over the soft cast 11/2; comes in with moderate to large drainage slightly smaller in terms of width we have been using [facility]. His wound looks satisfactory but not much improvement 11/4; patient presents today for obligatory cast change. Has no issues or complaints today. He denies signs of infection. 11/9; patient traveled this weekend to [place], was on the cast quite a bit. Staining of the cast with black material from his  walking boot. Drainage was not quite as bad as we feared. Using silver alginate and Keystone 11/16; we do not have size for cast therefore we have been putting a soft cast on him since the change on Friday. Still a significant amount of drainage necessitating changing twice a week. We have been using the Keystone at cast changes either hard or soft as well as silver alginate Comes in the clinic with things actually looking fairly good improvement in width. He says his offloading is about the same 02/24/2021 upon evaluation today patient actually comes back in and is doing excellent in regard to his foot ulcer this is significantly smaller even compared to the last visit. The soft cast seems to have done extremely well for him which is great news. I do not see any signs of infection minimal debridement will be needed today. 11/30; left lateral foot much improved half a centimeter improvement in surface area. No evidence of infection. He seems to be doing better with the soft cast in the TCC therefore we will continue with this. He comes back in later in the week for a change with the nurses. This is due to drainage 12/6; no improvement in dimensions. Under illumination some debris on the surface we have been using silver alginate, soft cast. If there is anything optimistic here he seems to have have less drainage 12/13. Dimensions are improved both length and width and slightly in depth. Appears to be quite healthy today. Raised edges of this thick skin and callus around the edges however. He is in a soft cast were bringing him back once for a change on Friday. Drainage is better 12/20. Dimensions are improved. He still has raised edges of thick skin and callus around the edges. We are using a soft cast 12/28; comes in today with thick callus around the wound. Using silver under alginate under a soft cast. I do not think there is much improvement in any measurement 2023 04/06/2021; patient was put in  a total contact cast. Unfortunately not much change in surface area 1/10; not much different still thick callus and skin around the edge in spite of the total contact cast. This was just debrided last week we have been using the [facility] compounded antibiotic and silver alginate under a total contact cast 1/18 the patient's wound on the left side is doing nicely. smaller HOWEVER he comes in today with a wound on the right foot laterally. blister most likely serosangquenous drainage 1/24; the patient continues to do well in terms of the  plantar left foot which is continued to contract using silver alginate under the total contact cast HOWEVER the right lateral foot is bigger with denuded skin around the edges. I used pickups and a #15 scalpel to remove this this looks like the remanence of a large blister. Cannot rule out infection. Culture in this area I did last week showed Staphylococcus lugdunensis few colonies. I am going to try to address this with his [employer] antibiotic that is done so well on the left having linezolid and this should cover the staph 2/1; the patient's wound on his left foot which was the original plantar foot wound thick skin and eschar around the edges even in the total contact cast but the wound surface does not look too bad The real problem is on how his right lateral foot at roughly the base of the fifth metatarsal. The wound is completely necrotic more worrisome than that there is swelling around the edges of this. We have been using silver alginate on both wounds and Keystone on the right foot. Unfortunately I think he is going to require systemic antibiotics while we await cultures. He did not get the x-ray done that we ordered last week [lost the prescription 2/7; disappointingly in the area on the left foot which we are treating with a total contact cast is still not closed although it is much smaller. He continues to have a lot of callus around the wound  edge. -Right lateral foot culture I did last week was negative x-ray also negative for osteomyelitis. 2/15: TCC silver alginate on the left and silver alginate on the right lateral. No real improvement in either area 05/26/2021: T oday, the wounds are roughly the same size as at his previous visit, post-debridement. He continues to endorse fairly substantial drainage, particularly on the right. He has been in a total contact cast on the left. There is still some callus surrounding this lesion. On the right, the periwound skin is quite macerated, along with surrounding callus. The center of the right-sided wound also has some dark, densely adherent material, which is very difficult to remove. 06/02/2021: Today, both wounds are slightly smaller. He has been using zinc oxide ointment around the right ulcer and the degree of maceration has improved markedly. There continues to be an area of nonviable tissue in the center of the right sided ulcer. The left-sided wound, which has been in the total contact cast. Appears clean and the degree of callus around it is less than previously. 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 He had to take the stairs and both wounds have increased in size. The left foot, which has been in a total contact cast, has developed a tunnel tracking to the lateral aspect of his foot. The nonviable tissue in the center of the right-sided ulcer remains recalcitrant to debridement. There is significant undermining surrounding the entirety of the left sided wound. 06/16/2021: The elevator at school has been fixed and the patient has been able to avoid putting as much weight on his wounds over the past week. We converted the left foot wound into a single lesion today, but despite this, the wound is actually smaller. The base is healthy with limited periwound callus. On the right, the central necrotic area is still present. He  continues to be quite macerated around the right sided wound, despite applying barrier cream. This does, however, have the benefit of softening the callus to make  it more easily removable. 06/23/2021: Today, the left wound is smaller. The lateral aspect that had opened up previously is now closed. The wound base has a healthy bed of granulation tissue and minimal slough. Unfortunately, on the right, the wound is larger and continues to be fairly macerated. He has also reopened the wound at his right ankle. He thinks this is due to the gait he has adopted secondary to his total contact cast and boot. 06/30/2021: T oday, both wounds are a little bit larger. The lateral aspect on the left has remained closed. He continues to have significant periwound maceration. The culture that I took from the right sided wound grew a population of bacteria that is not covered by his current [facility] antibiotic. The center of the right- sided wound continues to appear necrotic with nonviable fat. It probes deeper today, but does not reach bone. [place], [name] ([id number]) [id number] Page 8 of 23 07/07/2021: The periwound maceration is a little bit less today. The right lateral foot wound has some areas that appear more viable and the necrotic center also looks a little bit better. The wound on the dorsal surface of his right foot near the ankle is contracting and the surface appears healthy. The left plantar wound surface looks healthy, but there is some new undermining on the medial portion. He did get his new Keystone antibiotic and began applying that to the right foot wound on Saturday. 07/14/2021: The intake nurse reported substantial drainage from his wounds, but the periwound skin actually looks better than is typical for him. The wound on the dorsal surface of his right foot near the ankle is smaller and just has a small open area underneath some dried eschar. The left plantar wound  surface looks healthy and there has been no significant accumulation of callus. The right lateral foot wound looks quite a bit better, with the central portion, which typically appears necrotic, looking more viable albeit pale. 07/22/2021: His left foot is extremely macerated today. The wound is about the same size. The wound on the dorsal surface of his right foot near the ankle had closed, but he traumatized it removing the dressing and there is a tiny skin tear in that location. The right lateral foot wound is bigger, but the surface appears healthy. 07/30/2021: The wound on the dorsal surface of his right foot near the ankle is closed. The right lateral foot wound again is a little bit bigger due to some undermining. The periwound skin is in better condition, however. He has been applying zinc oxide. The wound surface is a little bit dry today. On the left, he does not have the substantial maceration that we frequently see. The wound itself is smaller and has a clean surface. 08/06/2021: Both wounds seem to have deteriorated over the past week. The right lateral foot wound has a dry surface but the periwound is boggy.. Overall wound dimensions are about the same. On the left, the wound is about the same size, but there is more undermining present underneath periwound callus. 08/13/2021: The right sided wound looks about the same, but on the left there has been substantial deterioration. The undermining continues to extend under periwound callus. Once this was removed, substantial extension of the wound was present. There is no odor or purulent drainage but clearly the wounds have broken down. 08/20/2021: The wounds look about the same today. He has been out of his total contact cast and has just been changing the dressings using topical Keystone  with PolyMem Ag, Kerlix and Ace bandages. The wound on the top of his right ankle has reopened but this is quite small. There was a little bit of  purulent material that I expressed when examining this wound. 08/24/2021: After the aggressive debridement I performed at his last visit, the wounds actually look a little bit better today. They are smaller with the exception of the wound on the top of his right ankle which is a little bit bigger as some more skin pulled off when he was changing his dressing. We are using topical Keystone with PolyMem Ag Kerlix and Ace bandages. 09/02/2021: There has been really no change to any of his wounds. 09/16/2021: The patient was hospitalized last week with nausea, vomiting, and dehydration. He says that while he was in the hospital, his wounds were not really addressed properly. T oday, both plantar foot wounds are larger and the periwound skin is macerated. The wound on the dorsum of his right foot has a scab on the top. The right foot now has a crater where previously he had had nonviable fat. It looks as though this simply died and fell out. The periwound callus is wet. 09/24/2021: His wounds have deteriorated somewhat since his last visit. The wound on the dorsum of his right foot near his ankle is larger and has more nonviable tissue present. The crater in his right foot is even deeper; I cannot quite palpate or probe to bone but I am sure it is close. The wound on his left plantar foot has an odd boggy area in the center that almost feels as though it has fluid within it. He has run out of his topical Keystone antibiotic. We are using silver alginate on his wounds. 09/29/2021: He has developed a new wound on the dorsum of his left foot near his ankle. He says he thinks his wrapping is rubbing in that site. I would concur with this as the wound on his right ankle is larger. The left foot looks about the same. The right foot has the crater that was present last week. No significant slough accumulation, but his foot remains quite swollen and warm despite oral antibiotic therapy. 10/08/2021: All of his wounds look  about the same as last week. He did not start his oral antibiotics that are prescribed until just a couple of days ago; his [employer] compounded antibiotics formula has been changed and he is awaiting delivery of the new recipe. His MRI that was scheduled for earlier this week was canceled as no prior authorization had been obtained; unfortunately the tech responsible sent an email to my old [place] email, which I no longer use nor have access to. 10/18/2021: The wounds on his bilateral dorsal feet near the ankles are both improved. They are smaller and have just some eschar and slough buildup. The left plantar wound has a fair amount of undermining, but the surface is clean. There is some periwound callus accumulation. On the right plantar foot, there is nonviable fat leading to a deep tunnel that tracks towards his dorsal medial foot. There is periwound callus and slough accumulation, as well. His right foot and leg remain swollen as compared to the left. 10/25/2021: The wounds on his bilateral dorsal feet and at the ankles have broken down somewhat. They are little bit larger than last week. The left plantar wound continues to undermine laterally but the surface is clean. The right plantar foot wound shows some decreased depth in the tunnel tracking  towards his dorsal medial foot. He has not yet had the Doppler study that I ordered; it sounds like there is some confusion about the scheduling of the procedure. In addition, the MRI was denied and I have taken steps to appeal the denial. 11/24/2021: Since our last visit, Mr. Sokalski was admitted to the hospital where an MRI suggested osteomyelitis. He was taken the operating room by podiatry. Bone biopsies were negative for osteomyelitis. They debrided his wounds and applied myriad matrix. He saw them last week and they removed his staples. He is here today to continue his wound healing process. T oday, both of the dorsal foot/ankle wounds are  substantially smaller. There is just a little eschar overlying the left sided wound and some eschar and slough on the right. The right plantar foot ulcer has the healthiest surface of granulation tissue that I have seen to date. A portion of the myriad matrix failed to take and was hanging loose. It appears that myriad morcells were placed into the tunnel closest to the dorsal portion of his foot. These have sloughed off. The left plantar foot ulcer is about the same size, but has a much healthier surface than in the past. Both plantar ulcers have callus and slough accumulation. 12/07/2021: Left dorsal foot/ankle wound is closed. The right dorsal foot/ankle wound is nearly closed and just has a small open area with some eschar and slough. The right plantar foot wound has contracted quite a bit since our last visit. It has a healthy surface with just a little bit of slough accumulation and periwound callus buildup. The left plantar foot wound is about the same size but the surface appears healthy. There is a little slough and periwound callus on this side, as well. 12/15/2021: Both dorsal foot/ankle wounds are closed. The right plantar foot wound is substantially smaller than at our last visit. The tunneling that was present has nearly closed. There is just a little bit of slough buildup. The left plantar foot wound is also a little bit smaller today. The surface is the healthiest that I have ever seen it. Light slough and periwound callus accumulation on this side. 12/22/2021: The dorsal ankle/foot wounds remain closed. The right plantar foot wound continues to contract. There is still a bit of depth at the lateral portion of the wound but the surface has a good granulated appearance. The wound on the left is about the same size, the central indented portion is still adherent. It also is clean with good granulation tissue present. The periwound skin and callus are a bit macerated, however. 12/29/2021: Both  ankle wounds remain closed. The right plantar foot wound is less than half the size that it was last week. There is no depth any longer and the surface has just a little bit of slough and periwound callus. On the left, the wound is not much smaller, but the surface is healthier with good granulation tissue. There is also a little slough and periwound callus buildup. 01/05/2022: The right plantar foot wound continues to contract and is once again about half the size as it was last week. There is just a little bit of surface slough and periwound callus. On the left, the depth of the wound has decreased and the diameter has started to contract. It is also very clean with just a little slough and biofilm present. [place], [name] ([id number]) [id number] Page 9 of 23 01/12/2022: The right plantar foot wound is smaller again today. There is just some periwound  callus and slough accumulation. On the left, there is a fair amount of undermining and the surface is a little boggy, but no other significant change. 01/19/2022: The right plantar wound continues to contract. There is minimal periwound callus accumulation and just a light layer of slough on the surface. On the left, the surface looks better, but there is fairly significant undermining near the 11:00 portion of the wound, aiming towards his great toe. The skin overlying this area is very healthy and has a good fat layer present. No malodor or purulent drainage. 01/26/2022: The right sided wound continues to contract and has just a light layer of slough on the surface. Minimal periwound callus. On the left, he still has substantial undermining and the tissue surface is not as robust as I would like to see. No malodor or purulent drainage, but he did say he had a lot of serous drainage over the weekend. 02/02/2022: The right foot wound is about a quarter of the size that it was at his last visit. There is just a little slough  on the surface and some periwound callus. On the left, the undermining persists and the tissue is still more pale, but he does not seem to have had as much drainage over the last weekend. We are waiting on a wound VAC. 02/09/2022: His right foot has healed. He received the wound VAC, but did not bring it to clinic with him today. The lateral aspect of the left foot wound has come in a little, but he still has substantial undermining on the medial and distal portion of the wound. Still with macerated periwound callus. 02/16/2022: The wound VAC was applied last Friday. T oday, there has been tremendous improvement in the surface of the wound bed. The undermined portion of the wound has come in a bit. There is still some overhanging dead skin. Overall the wound looks much better. 02/23/2022: No significant change in the overall wound dimensions, but the wound surface continues to improve and appears healthier and more robust. There is some periwound callus accumulation and overhanging skin. No concern for infection. 03/02/2022: Although the measurements taken in clinic today were unchanged, the visual appearance of the wound looks like it is smaller and the undermining/tunneling is filling with flesh. There is less periwound tissue maceration than usual. No malodor or purulent drainage. No concern for infection. 03/09/2022: The undermining has come in by couple of millimeters. The periwound is a little bit more macerated this week. No concern for infection. 12/13; substantial wound on the left plantar foot. We are using silver collagen and a wound VAC. 03/23/2022: The undermining continues to contract. The wound surface is a little bit dry. 03/30/2022: The undermining has essentially resolved. There is still some depth at the center of the wound. The wound surface has good beefy tissue and the moisture balance is better. 04/06/2022: The wound dimensions are about the same, except that the depth is beginning  to fill in. He has had more drainage this week, but there is no obvious concern for infection. 04/13/2022: He has built up a little bit of callus around the edges of the wound, creating some undermining. Otherwise the wound looks fairly unchanged. 04/20/2022: The wound bed tissue looks very healthy and robust. Light biofilm on the surface. There is some built up wet callus around the wound edges, but no undermining. 04/27/2022: The wound surface continues to look very healthy. Although the wound dimensions measured the same, it looks smaller to me. No  real callus accumulation this week. Minimal slough on the surface. 05/04/2022: The wound measured smaller and shallower this week. We are still awaiting insurance approval for Apligraf. 05/11/2022: The wound is about the same in terms of depth but measured a little bit narrower today. He has built up a rim of moist callus around the edges. He has been approved for Apligraf and we will apply that today. 05/18/2022: The wound measured a little bit smaller again today. No significant callus buildup this week. 05/25/2022: The wound was measured about the same size today, but the multiple nooks and crannies in the depths of the wound seem to be filling in. Electronic Signature(s) Signed: 05/25/2022 9:17:51 AM By: [name] MD FACS Entered By: [name] on 05/25/2022 09:17:50 -------------------------------------------------------------------------------- Physical Exam Details Patient Name: Date of Service: [name], [name] 05/25/2022 8:15 A M Medical Record Number: [id number] Patient Account Number: [id number] Date of Birth/Sex: Treating RN: 1985/07/05 (37 y.o. M) Primary Care Provider: [name] Other Clinician: Referring Provider: Treating Provider/Extender: [name] in Treatment: 134 Constitutional . . . . no acute distress. Respiratory Normal work of breathing on room air. [place], [name] ([id number])  [id number] Page 10 of 23 Notes 05/25/2022: The wound was measured about the same size today, but the multiple nooks and crannies in the depths of the wound seem to be filling in. Electronic Signature(s) Signed: 05/25/2022 9:18:22 AM By: [name] MD FACS Entered By: [name] on 05/25/2022 09:18:22 -------------------------------------------------------------------------------- Physician Orders Details Patient Name: Date of Service: [name], [name] 05/25/2022 8:15 A M Medical Record Number: [id number] Patient Account Number: [id number] Date of Birth/Sex: Treating RN: [birth year] (37 y.o. 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 - Dr. [name] - Room 1 with [name] Return A Wednesday 2/28 @ 0815 am Cellular or Tissue Based Products Cellular or Tissue Based Product Type: - Apligraf #3 daptic or Mepitel. (DO NOT REMOVE). - may Cellular or Tissue Based Product applied to wound bed, secured with steri-strips, cover with A change black sponge, leave steristrips in place Bathing/ Shower/ Hygiene May shower and wash wound with soap and water. Negative Presssure Wound Therapy Wound #3 Left,Lateral,Plantar Foot Wound Vac to wound continuously at 161m/hg pressure - decrease to 100 mm/hg pressure Black Foam Edema Control - Lymphedema / SCD / Other Bilateral Lower Extremities Avoid standing for long periods of time. Exercise regularly Compression stocking or Garment 20-30 mm/Hg pressure to: - to both legs daily Off-Loading Open toe surgical shoe to: - Both  feet Additional Orders / Instructions Follow Nutritious Diet Non Wound Condition pply the following to affected area as directed: - continue to apply foam donut or cushion to healed right plantar foot A Wound Treatment Wound #3 - Foot Wound Laterality: Plantar, Left, Lateral Cleanser: Soap and Water 3 x Per Week/30 Days Discharge Instructions: May shower and wash wound with dial antibacterial soap and water prior to dressing change. Peri-Wound Care: Skin Prep (Generic) 3 x Per Week/30 Days Discharge Instructions: Use skin prep as directed Prim Dressing: Apligraf ary 3 x Per Week/30 Days Prim Dressing: VAC ary 3 x Per Week/30 Days [place], [place]([id number] Page 11 of 23  Secondary Dressing: ADAPTIC TOUCH 3x4.25 in 3 x Per Week/30 Days Discharge Instructions: Apply over primary dressing as directed. Secondary Dressing: Zetuvit Plus 4x8 in (Dispense As Written) 3 x Per Week/30 Days Discharge Instructions: Apply over primary dressing as directed. Secured With: Elastic Bandage 4 inch (ACE bandage) (Generic) 3 x Per Week/30 Days Discharge Instructions: Secure with ACE bandage as directed. Secured With: 62M Medipore Public affairs consultant Surgical T 2x10 (in/yd) (Generic) 3 x Per Week/30 Days ape Discharge Instructions: Secure with tape as directed. Compression Wrap: Kerlix Roll 4.5x3.1 (in/yd) (Generic) 3 x Per Week/30 Days Discharge Instructions: Apply Kerlix and Coban compression as directed. Electronic Signature(s) Signed: 05/25/2022 10:33:46 AM By: [name] MD FACS Entered By: [name] on 05/25/2022 09:18:37 -------------------------------------------------------------------------------- Problem List Details Patient Name: Date of Service: [name], [name] 05/25/2022 8:15 A M Medical Record Number: [id number] Patient Account Number: [id number] Date of Birth/Sex: Treating RN: 1985-06-29 (37 y.o. [name] Primary Care Provider:  [name] Other Clinician: Referring Provider: Treating Provider/Extender: [name] in Treatment: 134 Active Problems ICD-10 Encounter Code Description Active Date MDM Diagnosis L97.528 Non-pressure chronic ulcer of other part of left foot with other specified 10/24/2019 No Yes severity E11.621 Type 2 diabetes mellitus with foot ulcer 10/24/2019 No Yes Inactive Problems ICD-10 Code Description Active Date Inactive Date L97.518 Non-pressure chronic ulcer of other part of right foot with other specified severity 07/14/2020 07/14/2020 L97.518 Non-pressure chronic ulcer of other part of right foot with other specified severity 10/24/2019 10/24/2019 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, right ankle and foot 10/24/2019 10/24/2019 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 08/10/2020 08/10/2020 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, left ankle and foot 10/24/2019 10/24/2019 L97.322 Non-pressure chronic ulcer of left ankle with fat layer exposed 09/29/2021 09/29/2021 [place], [name] ([id number]) [id number] Page 12 of 23 B95.62 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 10/24/2019 10/24/2019 classified elsewhere Resolved Problems Electronic Signature(s) Signed: 05/25/2022 9:15:22 AM By: [name] MD FACS Entered By: [name] on 05/25/2022 09:15:22 -------------------------------------------------------------------------------- Progress Note Details Patient Name: Date of Service: [name], [name] 05/25/2022 8:15 A M Medical Record Number: [id number] Patient Account Number: [id number] Date of Birth/Sex: Treating RN: 1985/05/20 (37 y.o. 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 He comes in for review of a wound over the plantar fifth metatarsal head extending into the lateral part of the foot. He was followed for this previously by his podiatrist Dr. Cornelius Moras. As the patient tells his story he went to see podiatry first for a swelling he developed on the lateral part of his fifth metatarsal head in May. He states this was "open" by podiatry and the area closed. He was followed up in June and it was again opened callus removed and it closed promptly. There were plans being made for surgery on the fifth metatarsal head in June however his blood sugar was apparently too high for anesthesia. Apparently the area was debrided and opened again in June  and it is never closed since. Looking over the records from podiatry I am really not able to follow this. It was clear when he was first seen it was before 5/14 at that point he already had a wound. By 5/17 the ulcer was resolved. I do not see anything about a procedure. On 5/28 noted to have pre-ulcerative moderate keratosis. X-ray noted 1/5 contracted toe and tailor's bunion and metatarsal deformity. On a visit date on 09/28/2018 the dorsal part of the left foot it healed and resolved. There was concern about swelling in his lower extremity he was sent to the ER.. As far as I can tell he was seen in the ER on 7/12 with an ulcer on his left foot. A DVT rule out of the left leg was negative. I do not think I have complete records from podiatry but I am not able to verify the procedures this patient states he had. He states after  the last procedure the wound has never closed although I am not able to follow this in the records I have from podiatry. He has not had a recent x-ray The patient has been using Neosporin on the wound. He is wearing a Darco shoe. He is still very active up on his foot working and exercising. Past medical history; type 2 diabetes ketosis-prone, leg swelling with a negative DVT study in July. Non-smoker ABI in our clinic was 0.85 on the left 10/16; substantial wound on the plantar left fifth met head extending laterally almost to the dorsal fifth MTP. We have been using silver alginate we gave him a Darco forefoot off loader. An x-ray did not show evidence of osteomyelitis did note soft tissue emphysema which I think was due to gas tracking through an open wound. There is no doubt in my mind he requires an MRI 10/23; MRI not booked until 3 November at the earliest this is largely due to his glucose sensor in the right arm. We have been using silver alginate. There has been an improvement 10/29; I am still not exactly sure when his MRI is booked for. He says it is the third but it is the 10th in epic. This definitely needs to be done. He is running a low-grade fever today but no other symptoms. No real improvement in the 1 02/26/2019 patient presents today for a follow-up visit here in our clinic he is last been seen in the clinic on October 29. Subsequently we were working on getting MRI to evaluate and see what exactly was going on and where we would need to go from the standpoint of whether or not he had osteomyelitis and again what treatments were going be required. Subsequently the patient ended up being admitted to the hospital on 02/07/2019 and was discharged on 02/14/2019. This is a somewhat interesting admission with a discharge diagnosis of pneumonia due to COVID-19 although he was positive for COVID-19 when tested at the urgent care but negative x2 when he was actually in the hospital. With  that being said he did have acute respiratory failure with hypoxia and it was noted he also have a left foot ulceration with osteomyelitis. With that being said he did require oxygen for his pneumonia and I level 4 L. He was placed on antivirals and steroids for the COVID-19. He was also transferred to the [place] at one point. Nonetheless he did subsequently discharged home and since being home has done much better in that regard. The CT angiogram did  not show any pulmonary embolism. With regard to the osteomyelitis the patient was placed on vancomycin and Zosyn while in the hospital but has been changed to Augmentin at discharge. It was also recommended that he follow- up with wound care and podiatry. Podiatry however wanted him to see [name] according to the patient prior to them doing anything further. His hemoglobin A1c was 9.9 as noted in the hospital. Have an MRI of the left foot performed while in the hospital on 02/04/2019. This showed evidence of septic arthritis at the fifth MTP joint and osteomyelitis involving the fifth metatarsal head and proximal phalanx. There is an overlying plantar open wound noted an abscess tracking back along the lateral aspect of the fifth metatarsal shaft. There is otherwise diffuse cellulitis and mild fasciitis without findings of polymyositis. The patient did have recently pneumonia secondary to COVID-19 I looked in the chart through epic and it does appear that the patient may need to have an additional x-ray just to ensure everything is cleared and that he has no airspace disease prior to putting him into the [name]. 03/05/2019; patient was readmitted to the clinic last week. He was hospitalized twice for a viral upper respiratory tract infection from 11/1 through 11/4 and then [place], [name] ([id number]) [id number] Page 13 of 23 11/5 through 11/12 ultimately this turned out to be Covid pneumonitis. Although he was  discharged on oxygen he is not using it. He says he feels fine. He has no exercise limitation no cough no sputum. His O2 sat in our clinic today was 100% on room air. 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 He received Vanco and Zosyn in the hospital and then was discharged on 2 weeks of Augmentin. I do not see any relevant cultures. He was supposed to follow-up with infectious disease but I do not see that he has an appointment. 12/8; patient saw Dr. Novella Olive of infectious disease last week. He felt that he had had adequate antibiotic therapy. He did not go to follow-up with Dr. Amalia Hailey of podiatry and I have again talked to him about the pros and cons of this. He does not want to consider a ray amputation of this time. He is aware of the risks of recurrence, migration etc. He started HBO today and tolerated this well. He can complete the Augmentin that I gave him last week. I have looked over the lab work that Dr. Chana Bode ordered his C-reactive protein was 3.3 and his sedimentation rate was 17. The C-reactive protein is never really been measurably that high in this patient 12/15; not much change in the wound today however he has undermining along the lateral part of the foot again more extensively than last week. He has some rims of epithelialization. We have been using silver alginate. He is undergoing hyperbarics but did not dive today 12/18; in for his obligatory first total contact cast change. Unfortunately there was pus coming from the undermining area around his fifth metatarsal head. This was cultured but will preclude reapplication of a cast. He is seen in conjunction with HBO 12/24; patient had staph lugdunensis in the wound in the undermining area laterally last time. We put him on doxycycline which should have covered this. The wound looks better today. I am going to give  him another week of doxycycline before reattempting the total contact cast 12/31; the patient is completing antibiotics. Hemorrhagic  debris in the distal part of the wound with some undermining distally. He also had hyper granulation. Extensive debridement with a #5 curette. The infected area that was on the lateral part of the fifth met head is closed over. I do not think he needs any more antibiotics. Patient was seen prior to HBO. Preparations for a total contact cast were made in the cast will be placed post hyperbarics 04/11/19; once again the patient arrives today without complaint. He had been in a cast all week noted that he had heavy drainage this week. This resulted in large raised areas of macerated tissue around the wound 1/14; wound bed looks better slightly smaller. Hydrofera Blue has been changing himself. He had a heavy drainage last week which caused a lot of maceration around the wound so I took him out of a total contact cast he says the drainage is actually better this week He is seen today in conjunction with HBO 1/21; returns to clinic. He was up in [place] for a day or 2 attending a funeral. He comes back in with the wound larger and with a large area of exposed bone. He had osteomyelitis and septic arthritis of the fifth left metatarsal head while he was in hospital. He received IV antibiotics in the hospital for a prolonged period of time then 3 weeks of Augmentin. Subsequently I gave him 2 weeks of doxycycline for more superficial wound infection. When I saw this last week the wound was smaller the surface of the wound looks satisfactory. 1/28; patient missed hyperbarics today. Bone biopsy I did last time showed Enterococcus faecalis and Staphylococcus lugdunensis . He has a wide area of exposed bone. We are going to use silver alginate as of today. I had another ethical discussion with the patient. 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  In this situation I think the likelihood of healing this is low. Therefore I have recommended a ray amputation and with the patient's agreement I have referred him to Dr. Doran Durand. The other issue is that his compliance with hyperbarics has been minimal because of his work schedule and given his underlying decision I am going to stop this today READMISSION 10/24/2019 MRI 09/29/2019 left foot IMPRESSION: 1. Apparent skin ulceration inferior and lateral to the 5th metatarsal base with underlying heterogeneous T2 signal and enhancement in the subcutaneous fat. Small peripherally enhancing fluid collections along the plantar and lateral aspects of the 5th metatarsal base suspicious for abscesses. 2. Interval amputation through the mid 5th metatarsal with nonspecific low-level marrow edema and enhancement. Given the proximity to the adjacent soft tissue inflammatory changes, osteomyelitis cannot be excluded. 3. The additional bones appear unremarkable. MRI 09/29/2019 right foot IMPRESSION: 1. Soft tissue ulceration lateral to the 5th MTP joint. There is low-level T2 hyperintensity within the 4th and 5th metatarsal heads and adjacent proximal phalanges without abnormal T1 signal or cortical destruction. These findings are nonspecific and could be seen with early marrow edema, hyperemia or early osteomyelitis. No evidence of septic joint. 2. Mild tenosynovitis and synovial enhancement associated with the extensor digitorum tendons at the level of the midfoot. 3. Diffuse low-level muscular T2 hyperintensity and enhancement, most consistent with diabetic myopathy. LEFT FOOT BONE Methicillin resistant staphylococcus aureus Staphylococcus lugdunensis MIC MIC CIPROFLOXACIN >=8 RESISTANT Resistant <=0.5 SENSI... Sensitive CLINDAMYCIN <=0.25 SENS... Sensitive >=8 RESISTANT Resistant ERYTHROMYCIN >=8 RESISTANT Resistant >=8 RESISTANT Resistant GENTAMICIN <=0.5 SENSI... Sensitive <=0.5 SENSI...  Sensitive Inducible Clindamycin NEGATIVE Sensitive NEGATIVE Sensitive OXACILLIN >=4  RESISTANT Resistant 2 SENSITIVE Sensitive RIFAMPIN <=0.5 SENSI... Sensitive <=0.5 SENSI... Sensitive TETRACYCLINE <=1 SENSITIVE Sensitive <=1 SENSITIVE Sensitive TRIMETH/SULFA <=10 SENSIT Sensitive <=10 SENSIT Sensitive ... Marland Kitchen.. VANCOMYCIN 1 SENSITIVE Sensitive <=0.5 SENSI... Sensitive Right foot bone . Component 3 wk ago Specimen Description BONE Special Requests RIGHT 4 METATARSAL SAMPLE B Gram Stain NO WBC SEEN NO ORGANISMS SEEN [place], [name] ([id number]) [id number] Page 14 of 23 Culture RARE METHICILLIN RESISTANT STAPHYLOCOCCUS AUREUS NO ANAEROBES ISOLATED Performed at [facility], [place] [address], [place], [place] [address] Report Status 10/08/2019 FINAL Organism ID, Bacteria METHICILLIN RESISTANT STAPHYLOCOCCUS AUREUS Resulting Agency [facility] Susceptibility Methicillin resistant staphylococcus aureus MIC CIPROFLOXACIN >=8 RESISTANT Resistant CLINDAMYCIN <=0.25 SENS... Sensitive ERYTHROMYCIN >=8 RESISTANT Resistant GENTAMICIN <=0.5 SENSI... Sensitive Inducible Clindamycin NEGATIVE Sensitive OXACILLIN >=4 RESISTANT Resistant RIFAMPIN <=0.5 SENSI... Sensitive TETRACYCLINE <=1 SENSITIVE Sensitive TRIMETH/SULFA <=10 SENSIT Sensitive ... VANCOMYCIN 1 SENSITIVE Sensitive This is a patient we had in clinic earlier this year with a wound over his left fifth metatarsal head. 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 Doran Durand on 05/16/2019. He seems to have developed problems with wounds on his bilateral feet in June 2021 although he may have had problems earlier than this. He was in  an urgent care with a right foot ulcer on 09/26/2019 and given a course of doxycycline. This was apparently after having trouble getting into see orthopedics. He was seen by podiatry on 09/28/2019 noted to have bilateral lower extremity ulcers including the left lateral fifth metatarsal base and the right subfifth met head. It was noted that had purulent drainage at that time. He required hospitalization from 6/20 through 7/2. This was because of worsening right foot wounds. He underwent bilateral operative incision and drainage and bone biopsies bilaterally. Culture results are listed above. He has been referred back to clinic by Dr. Jacqualyn Posey of podiatry. He is also followed by Dr. Megan Salon who saw him yesterday. He was discharged from hospital on Zyvox Flagyl and Levaquin and yesterday changed to doxycycline Flagyl and Levaquin. His inflammatory markers on 6/26 showed a sedimentation rate of 129 and a C-reactive protein of 5. This is improved to 14 and 1.3 respectively. This would indicate improvement. ABIs in our clinic today were 1.23 on the right and 1.20 on the left 11/01/2019 on evaluation today patient appears to be doing fairly well in regard to the wounds on his feet at this point. Fortunately there is no signs of active infection at this time. No fevers, chills, nausea, vomiting, or diarrhea. He currently is seeing infectious disease and still under their care at this point. Subsequently he also has both wounds which she has not been using collagen on as he did not receive that in his packaging he did not call us and let us know that. Apparently that just was missed on the order. Nonetheless we will get that straightened out today. 8/9-Patient returns for bilateral foot wounds, using Prisma with hydrogel moistened dressings, and the wounds appear stable. Patient using surgical shoes, avoiding much pressure or weightbearing as much as possible 8/16; patient has bilateral foot wounds. 1 on the  right lateral foot proximally the other is on the left mid lateral foot. Both required debridement of callus and thick skin around  the wounds. We have been using silver collagen 8/27; patient has bilateral lateral foot wounds. The area on the left substantially surrounded by callus and dry skin. This was removed from the wound edge. The underlying wound is small. The area on the right measured somewhat smaller today. 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 Unfortunately I did not update his antibiotics during today's visit. 9/10 I reviewed Dr. Hale Bogus last notes he felt he had completed antibiotics his inflammatory markers were reasonably well controlled. He has a small wound on the lateral left foot and a tiny area on the right which is just above closed. He is using Hydrofera Blue with border foam he has bilateral surgical shoes 9/24; 2 week f/u. doing well. right foot is closed. left foot still undermined. 10/14; right foot remains closed at the fifth met head. The area over the base of the left fifth metatarsal has a small open area but considerable undermining towards the plantar foot. Thick callus skin around this suggests an adequate pressure relief. We have talked about this. He says he is going to go back into his cam boot. I suggested a total contact cast he did not seem enamored with this suggestion 10/26; left foot base of the fifth metatarsal. Same condition as last time. He has skin over the area with an open wound however the skin is not adherent. He went to see Dr. Earleen Newport who did an x-ray and culture of his foot I have not reviewed the x-ray but the patient was not told anything. He is on doxycycline 11/11; since the patient was last here he was in the emergency room on 10/30 he was concerned about swelling in the left foot. They did not do any cultures or x-rays. They changed his antibiotics to cephalexin.  Previous culture showed group B strep. The cephalexin is appropriate as doxycycline has less than predictable coverage. Arrives in clinic today with swelling over this area under the wound. He also has a new wound on the right fifth metatarsal head 11/18; the patient has a difficult wound on the lateral aspect of the left fifth metatarsal head. The wound was almost ballotable last week I opened it slightly expecting to see purulence however there was just bleeding. I cultured this this was negative. X-ray unchanged. We are trying to get an MRI but I am not sure were going to be able to get this through his insurance. He also has an area on the right lateral fifth metatarsal head this looks healthier 12/3; the patient finally got our MRI. Surprisingly this did not show osteomyelitis. I did show the soft tissue ulceration at the lateral plantar aspect of the fifth metatarsal base with a tiny residual 6 mm abscess overlying the superficial fascia I have tried to culture this area I have not been able to get this to grow anything. Nevertheless the protruding tissue looks aggravated. I suspect we should try to treat the underlying "abscess with broad-spectrum antibiotics. I am going to start him on Levaquin and Flagyl. He has much less edema in his legs and I am going to continue to wrap his legs and see him weekly 12/10. I started Levaquin and Flagyl on him last week. He just picked up the Flagyl apparently there was some delay. The worry is the wound on the left fifth metatarsal base which is substantial and worsening. His foot looks like he inverts at the ankle making this a  weightbearing surface. Certainly no improvement in fact I think the measurements of this are somewhat worse. We have been using 12/17; he apparently just got the Levaquin yesterday this is 2 weeks after the fact. He has completed the Flagyl. The area over the left fifth metatarsal base still has protruding granulation tissue although  it does not look quite as bad as it did some weeks ago. He has severe bilateral lymphedema although we have not been treating him for wounds on his legs this is definitely going to require compression. There was so much edema in the left I did not wish to put him in a total contact cast today. I am going to increase his compression from 3-4 layer. The area on the right lateral fifth met head actually look quite good and superficial. 12/23; patient arrived with callus on the right fifth met head and the substantial hyper granulated callused wound on the base of his fifth metatarsal. He says he is completing his Levaquin in 2 days but I do not think that adds up with what I gave him but I will have to double check this. We are using Hydrofera Blue on both areas. My plan is to put the left leg in a cast the week after New Year's 04/06/2020; patient's wounds about the same. Right lateral fifth metatarsal head and left lateral foot over the base of the fifth metatarsal. There is undermining on the left lateral foot which I removed before application of total contact cast continuing with Hydrofera Blue new. Patient tells me he was seen by endocrinology today lab work was done [Dr. Kerr]. Also wondering whether he was referred to cardiology. I went over some lab work from previously does not have chronic renal failure certainly not nephrotic range proteinuria he does have very poorly controlled diabetes but this is not his most updated lab work. Hemoglobin A1c has been over 11 1/10; the patient had a considerable amount of leakage towards mid part of his left foot with macerated skin however the wound surface looks better the area on the right lateral fifth met head is better as well. I am going to change the dressing on the left foot under the total contact cast to silver alginate, continue with Hydrofera Blue on the right. 1/20; patient was in the total contact cast for 10 days. Considerable amount of  drainage although the skin around the wound does not look too bad on the left foot. The area on the right fifth metatarsal head is closed. Our nursing staff reports large amount of drainage out of the left lateral foot wound [place], [name] ([id number]) [id number].pdf Page 15 of 23 1/25; continues with copious amounts of drainage described by our intake staff. PCR culture I did last week showed E. coli and Enterococcus faecalis and low quantities. Multiple resistance genes documented including extended spectrum beta lactamase, MRSA, MRSE, quinolone, tetracycline. The wound is not quite as good this week as it was 5 days ago but about the same size 2/3; continues with copious amounts of malodorous drainage per our intake nurse. The PCR culture I did 2 weeks ago showed E. coli and low quantities of Enterococcus. There were multiple resistance genes detected. I put Neosporin on him last week although this does not seem to have helped. The wound is slightly deeper today. Offloading continues to be an issue here although with the amount of drainage she has a total contact cast is just not going to work 2/10; moderate amount of drainage. Patient  reports he cannot get his stocking on over the dressing. I told him we have to do that the nurse gave him suggestions on how to make this work. The wound is on the bottom and lateral part of his left foot. Is cultured predominantly grew low amounts of Enterococcus, E. coli and anaerobes. There were multiple resistance genes detected including extended spectrum beta lactamase, quinolone, tetracycline. I could not think of an easy oral combination to address this so for now I am going to do topical antibiotics provided by [facility] I think the main agents here are vancomycin and an aminoglycoside. We have to be able to give him access to the wounds to get the topical antibiotic on 2/17; moderate amount of drainage this is unchanged. He has his  Keystone topical antibiotic against the deep tissue culture organisms. He has been using this and changing the dressing daily. Silver alginate on the wound surface. 2/24; using Keystone antibiotic with silver alginate on the top. He had too much drainage for a total contact cast at one point although I think that is improving and I think in the next week or 2 it might be possible to replace a total contact cast I did not do this today. In general the wound surface looks healthy however he continues to have thick rims of skin and subcutaneous tissue around the wide area of the circumference which I debrided 06/04/2020 upon evaluation today patient appears to be doing well in regard to his wound. I do feel like he is showing signs of improvement. There is little bit of callus and dead tissue around the edges of the wound as well as what appears to be a little bit of a sinus tract that is off to the side laterally I would perform debridement to clear that away today. 3/17; left lateral foot. The wound looks about the same as I remember. Not much depth surface looks healthy. No evidence of infection 3/25; left lateral foot. Wound surface looks about the same. Separating epithelium from the circumference. There really is no evidence of infection here however not making progress by my view 3/29; left lateral foot. Surface of the wound again looks reasonably healthy still thick skin and subcutaneous tissue around the wound margins. There is no evidence of infection. One of the concerns being brought up by the nurses has again the amount of drainage vis--vis continued use of a total contact cast 4/5; left lateral foot at roughly the base of the fifth metatarsal. Nice healthy looking granulated tissue with rims of epithelialization. The overall wound measurements are not any better but the tissue looks healthy. The only concern is the amount of drainage although he has no surrounding maceration with what we  have been doing recently to absorb fluid and protect his skin. He also has lymphedema. 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 He states that the kerlix/coban under the TCC rolled down and could not get it back up. He has been using an offloading scooter and has somehow hurt his right foot using this device. This happened last week. He states that the side of his right foot developed a blister and opened. The top of his foot also has a few small open wounds  he thinks is due to his socks rubbing in his shoes. He has not been using any dressings to the wound. He denies purulent drainage, fever/chills or erythema to the wounds. 4/22; patient presents for 1 week follow-up. He developed new wounds to the right foot that were evaluated at last clinic visit. He continues to have a total contact cast to the left leg and he reports no issues. He has been using silver collagen to the right foot wounds with no issues. He denies purulent drainage, fever/chills or erythema to the right foot wounds. He has no complaints today 4/25; patient presents for 1 week follow-up. He has a total contact cast of the left leg and reports no issues. He has been using silver alginate to the right foot wound. He denies purulent drainage, fever/chills or erythema to the right foot wounds. 5/2 patient presents for 1 week follow-up. T contact cast on the left. The wound which is on the base of the plantar foot at the base of the fifth metatarsal otal actually looks quite good and dimensions continue to gradually contract. HOWEVER the area on the right lateral fifth metatarsal head is much larger than what I remember from 2 weeks ago. Once more is he has significant levels  of hypergranulation. Noteworthy that he had this same hyper granulated response on his wound on the left foot at one point in time. So much so that he I thought there was an underlying fluid collection. Based on this I think this just needs debridement. 5/9; the wound on the left actually continues to be gradually smaller with a healthy surface. Slight amount of drainage and maceration of the skin around but not too bad. However he has a large wound over the right fifth metatarsal head very much in the same configuration as his left foot wound was initially. I used silver nitrate to address the hyper granulated tissue no mechanical debridement 5/16; area on the left foot did not look as healthy this week deeper thick surrounding macerated skin and subcutaneous tissue. oo The area on the right foot fifth met head was about the same oo The area on the right ankle that we identified last week is completely broken down into an open wound presumably a stocking rubbing issue 5/23; patient has been using a total contact cast to the left side. He has been using silver alginate underneath. He has also been using silver alginate to the right foot wounds. He has no complaints today. He denies any signs of infection. 5/31; the left-sided wound looks some better measure smaller surface granulation looks better. We have been using silver alginate under the total contact cast oo The large area on his right fifth met head and right dorsal foot look about the same still using silver alginate 6/6; neither side is good as I was hoping although the surface area dimensions are better. A lot of maceration on his left and right foot around the wound edge. Area on the dorsal right foot looks better. He says he was traveling. I am not sure what does the amount of maceration around the plantar wounds may be drainage issues 6/13; in general the wound surfaces look quite good on both sides. Macerated skin and raised edges  around the wound required debridement although in general especially on the left the surface area seems improved. oo The area on the right dorsal ankle is about the same I thought this would not be such a problem to close 6/20; not much  change in either wound although the one on the right looks a little better. Both wounds have thick macerated edges to the skin requiring debridements. We have been using silver alginate. The area on the dorsal right ankle is still open I thought this would be closed. 6/28; patient comes in today with a marked deterioration in the right foot wound fifth met head. Wide area of exposed bone this is a drastic change from last time. The area on the left there we have been casting is stagnant. We have been using silver alginate in both wound areas. 7/5; bone culture I did for PCR last time was positive for Pseudomonas, group B strep, Enterococcus and Staph aureus. There was no suggestion of methicillin resistance or ampicillin resistant genes. This was resistant to tetracycline however He comes into the clinic today with the area over his right plantar fifth metatarsal head which had been doing so well 2 weeks ago completely necrotic feeling bone. I do not know that this is going to be salvageable. The left foot wound is certainly no smaller but it has a better surface and is superficial. 7/8; patient called in this morning to say that his total contact cast was rubbing against his foot. He states he is doing fine overall. He denies signs of infection. 7/12; continued deterioration in the wound over the right fifth metatarsal head crumbling bone. This is not going to be salvageable. The patient agrees and wants to be referred to Dr. Doran Durand which we will attempt to arrange as soon as possible. I am going to continue him on antibiotics as long as that takes so I will renew those today. The area on the left foot which is the base of the fifth metatarsal continues to look  somewhat better. Healthy looking tissue no depth no debridement is necessary here. 7/20; the patient was kindly seen by Dr. Doran Durand of orthopedics on 10/19/2020. 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 Towards this end we have taken him out of the total contact cast on the left we will put him in a wrap with Hydrofera Blue. Tessier, [name] ([id number]) [id number] Page 16 of 23 As I understand things surgery is planned for 7/21 7/27; patient had his surgery last Thursday. 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 Apparently everything went well we did not still disturb that today The area on the left foot actually looks quite good. He has been much less mobile which probably explains this he did not seem to do well in the total contact cast secondary to drainage and maceration I think. 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 Fortunately there is no evidence of active infection at this time. No fevers, chills, nausea, vomiting, or diarrhea. Overall I think that he is actually doing extremely well. Nonetheless I do believe that he is staying off of this more following the surgery in his right foot that is the reason the left is doing so great. 8/16; left plantar foot wound. This looks smaller than the last time I saw this he is using Hydrofera Blue. The surgical wound on the right foot is being followed by Dr. Doran Durand we did not look at this today. He has surgical shoes on both feet 8/23; left plantar foot wound not as  good this week. Surrounding macerated skin and subcutaneous tissue everything looks moist and wet. I do not think he is offloading this adequately. He is using a surgical shoe Apparently the right foot surgical wound is not open although I did not check his  foot 8/31; left plantar foot lateral aspect. Much improved this week. He has no maceration. Some improvement in the surface area of the wound but most impressively the depth is come in we are using silver alginate. The patient is a Product/process development scientist. He is asked that we write him a letter so he can go back to work. I have also tried to see if we can write something that will allow him to limit the amount of time that he is on his foot at work. 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 Hopefully the latter can be avoided 9/6; I believe the patient missed an appointment last week. He arrives in today with a wound looking roughly the same certainly no better. Undermining laterally and also inferiorly. We used molecuLight today in training with the patient's permission.. We are using silver alginate 9/21 wound is measuring bigger this week although this may have to do with the aggressive circumferential debridement last week in response to the blush fluorescence on the MolecuLight. Culture I did last week showed significant MSSA and E. coli. I put him on Augmentin but he has not started it yet. We are also going to send this for compounded antibiotics at [facility]. There is no evidence of systemic infection 9/29; silver alginate. His Keystone arrived. He is completing Augmentin in 2 days. Offloading in a cam boot. Moderate drainage per our intake staff 10/5; using silver alginate. He has been using his [place]. He has completed his Augmentin. Per our intake nurse still a lot of drainage, far too much to consider a total contact cast. Wound measures about the same. He had the same undermining area that I defined last week from a roughly 11-3. I remove this today 10/12; using silver alginate he is using the [place]. He comes in for a nurse visit hence we are applying [employer] twice a week. Measuring slightly better today  and less notable drainage. Extensive debridement of the wound edge last time 10/18; using topical Keystone and silver alginate and a soft cast. Wound measurements about the same. Drainage was through his soft cast. We are changing this twice a week Tuesdays and Friday 10/25; comes in with moderate drainage. Still using Keystone silver alginate and a soft cast. Wound dimensions completely the same.He has a lot of edema in the left leg he has lymphedema. Asking for [name] to consider wrapping him as he cannot get his stocking on over the soft cast 11/2; comes in with moderate to large drainage slightly smaller in terms of width we have been using [place]. His wound looks satisfactory but not much improvement 11/4; patient presents today for obligatory cast change. Has no issues or complaints today. He denies signs of infection. 11/9; patient traveled this weekend to [place], was on the cast quite a bit. Staining of the cast with black material from his walking boot. Drainage was not quite as bad as we feared. Using silver alginate and Keystone 11/16; we do not have size for cast therefore we have been putting a soft cast on him since the change on Friday. Still a significant amount of drainage necessitating changing twice a  week. We have been using the Keystone at cast changes either hard or soft as well as silver alginate Comes in the clinic with things actually looking fairly good improvement in width. He says his offloading is about the same 02/24/2021 upon evaluation today patient actually comes back in and is doing excellent in regard to his foot ulcer this is significantly smaller even compared to the last visit. The soft cast seems to have done extremely well for him which is great news. I do not see any signs of infection minimal debridement will be needed today. 11/30; left lateral foot much improved half a centimeter improvement in surface area. No evidence of infection. He seems to be doing better  with the soft cast in the TCC therefore we will continue with this. He comes back in later in the week for a change with the nurses. This is due to drainage 12/6; no improvement in dimensions. Under illumination some debris on the surface we have been using silver alginate, soft cast. If there is anything optimistic here he seems to have have less drainage 12/13. Dimensions are improved both length and width and slightly in depth. Appears to be quite healthy today. Raised edges of this thick skin and callus around the edges however. He is in a soft cast were bringing him back once for a change on Friday. Drainage is better 12/20. Dimensions are improved. He still has raised edges of thick skin and callus around the edges. We are using a soft cast 12/28; comes in today with thick callus around the wound. Using silver under alginate under a soft cast. I do not think there is much improvement in any measurement 2023 04/06/2021; patient was put in a total contact cast. Unfortunately not much change in surface area 1/10; not much different still thick callus and skin around the edge in spite of the total contact cast. This was just debrided last week we have been using the [facility] compounded antibiotic and silver alginate under a total contact cast 1/18 the patient's wound on the left side is doing nicely. smaller HOWEVER he comes in today with a wound on the right foot laterally. blister most likely serosangquenous drainage 1/24; the patient continues to do well in terms of the plantar left foot which is continued to contract using silver alginate under the total contact cast HOWEVER the right lateral foot is bigger with denuded skin around the edges. I used pickups and a #15 scalpel to remove this this looks like the remanence of a large blister. Cannot rule out infection. Culture in this area I did last week showed Staphylococcus lugdunensis few colonies. I am going to try to address this with his  [employer] antibiotic that is done so well on the left having linezolid and this should cover the staph 2/1; the patient's wound on his left foot which was the original plantar foot wound thick skin and eschar around the edges even in the total contact cast but the wound surface does not look too bad The real problem is on how his right lateral foot at roughly the base of the fifth metatarsal. The wound is completely necrotic more worrisome than that there is swelling around the edges of this. We have been using silver alginate on both wounds and Keystone on the right foot. Unfortunately I think he is going to require systemic antibiotics while we await cultures. He did not get the x-ray done that we ordered last week [lost the prescription 2/7; disappointingly in  the area on the left foot which we are treating with a total contact cast is still not closed although it is much smaller. He continues to have a lot of callus around the wound edge. -Right lateral foot culture I did last week was negative x-ray also negative for osteomyelitis. 2/15: TCC silver alginate on the left and silver alginate on the right lateral. No real improvement in either area 05/26/2021: T oday, the wounds are roughly the same size as at his previous visit, post-debridement. He continues to endorse fairly substantial drainage, particularly on the right. He has been in a total contact cast on the left. There is still some callus surrounding this lesion. On the right, the periwound skin is Vecchione, [name] ([id number]) [id number] Page 17 of 23 quite macerated, along with surrounding callus. The center of the right-sided wound also has some dark, densely adherent material, which is very difficult to remove. 06/02/2021: Today, both wounds are slightly smaller. He has been using zinc oxide ointment around the right ulcer and the degree of maceration has improved markedly. There continues to be an area of  nonviable tissue in the center of the right sided ulcer. The left-sided wound, which has been in the total contact cast. Appears clean and the degree of callus around it is less than previously. 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 He had to take the stairs and both wounds have increased in size. The left foot, which has been in a total contact cast, has developed a tunnel tracking to the lateral aspect of his foot. The nonviable tissue in the center of the right-sided ulcer remains recalcitrant to debridement. There is significant undermining surrounding the entirety of the left sided wound. 06/16/2021: The elevator at school has been fixed and the patient has been able to avoid putting as much weight on his wounds over the past week. We converted the left foot wound into a single lesion today, but despite this, the wound is actually smaller. The base is healthy with limited periwound callus. On the right, the central necrotic area is still present. He continues to be quite macerated around the right sided wound, despite applying barrier cream. This does, however, have the benefit of softening the callus to make it more easily removable. 06/23/2021: Today, the left wound is smaller. The lateral aspect that had opened up previously is now closed. The wound base has a healthy bed of granulation tissue and minimal slough. Unfortunately, on the right, the wound is larger and continues to be fairly macerated. He has also reopened the wound at his right ankle. He thinks this is due to the gait he has adopted secondary to his total contact cast and boot. 06/30/2021: T oday, both wounds are a little bit larger. The lateral aspect on the left has remained closed. He continues to have significant periwound maceration. The culture that I took from the right sided wound grew a population of bacteria that is not covered by his current [facility]  antibiotic. The center of the right- sided wound continues to appear necrotic with nonviable fat. It probes deeper today, but does not reach bone. 07/07/2021: The periwound maceration is a little bit less today. The right lateral foot wound has some areas that appear more viable and the necrotic center also looks a little bit better. The wound on the dorsal surface of his right foot near the ankle is contracting and the surface appears healthy. The  left plantar wound surface looks healthy, but there is some new undermining on the medial portion. He did get his new Keystone antibiotic and began applying that to the right foot wound on Saturday. 07/14/2021: The intake nurse reported substantial drainage from his wounds, but the periwound skin actually looks better than is typical for him. The wound on the dorsal surface of his right foot near the ankle is smaller and just has a small open area underneath some dried eschar. The left plantar wound surface looks healthy and there has been no significant accumulation of callus. The right lateral foot wound looks quite a bit better, with the central portion, which typically appears necrotic, looking more viable albeit pale. 07/22/2021: His left foot is extremely macerated today. The wound is about the same size. The wound on the dorsal surface of his right foot near the ankle had closed, but he traumatized it removing the dressing and there is a tiny skin tear in that location. The right lateral foot wound is bigger, but the surface appears healthy. 07/30/2021: The wound on the dorsal surface of his right foot near the ankle is closed. The right lateral foot wound again is a little bit bigger due to some undermining. The periwound skin is in better condition, however. He has been applying zinc oxide. The wound surface is a little bit dry today. On the left, he does not have the substantial maceration that we frequently see. The wound itself is smaller and has a  clean surface. 08/06/2021: Both wounds seem to have deteriorated over the past week. The right lateral foot wound has a dry surface but the periwound is boggy.. Overall wound dimensions are about the same. On the left, the wound is about the same size, but there is more undermining present underneath periwound callus. 08/13/2021: The right sided wound looks about the same, but on the left there has been substantial deterioration. The undermining continues to extend under periwound callus. Once this was removed, substantial extension of the wound was present. There is no odor or purulent drainage but clearly the wounds have broken down. 08/20/2021: The wounds look about the same today. He has been out of his total contact cast and has just been changing the dressings using topical Keystone with PolyMem Ag, Kerlix and Ace bandages. The wound on the top of his right ankle has reopened but this is quite small. There was a little bit of purulent material that I expressed when examining this wound. 08/24/2021: After the aggressive debridement I performed at his last visit, the wounds actually look a little bit better today. They are smaller with the exception of the wound on the top of his right ankle which is a little bit bigger as some more skin pulled off when he was changing his dressing. We are using topical Keystone with PolyMem Ag Kerlix and Ace bandages. 09/02/2021: There has been really no change to any of his wounds. 09/16/2021: The patient was hospitalized last week with nausea, vomiting, and dehydration. He says that while he was in the hospital, his wounds were not really addressed properly. T oday, both plantar foot wounds are larger and the periwound skin is macerated. The wound on the dorsum of his right foot has a scab on the top. The right foot now has a crater where previously he had had nonviable fat. It looks as though this simply died and fell out. The periwound callus is wet. 09/24/2021: His  wounds have deteriorated somewhat since his last  visit. The wound on the dorsum of his right foot near his ankle is larger and has more nonviable tissue present. The crater in his right foot is even deeper; I cannot quite palpate or probe to bone but I am sure it is close. The wound on his left plantar foot has an odd boggy area in the center that almost feels as though it has fluid within it. He has run out of his topical Keystone antibiotic. We are using silver alginate on his wounds. 09/29/2021: He has developed a new wound on the dorsum of his left foot near his ankle. He says he thinks his wrapping is rubbing in that site. I would concur with this as the wound on his right ankle is larger. The left foot looks about the same. The right foot has the crater that was present last week. No significant slough accumulation, but his foot remains quite swollen and warm despite oral antibiotic therapy. 10/08/2021: All of his wounds look about the same as last week. He did not start his oral antibiotics that are prescribed until just a couple of days ago; his [employer] compounded antibiotics formula has been changed and he is awaiting delivery of the new recipe. His MRI that was scheduled for earlier this week was canceled as no prior authorization had been obtained; unfortunately the tech responsible sent an email to my old [place] email, which I no longer use nor have access to. 10/18/2021: The wounds on his bilateral dorsal feet near the ankles are both improved. They are smaller and have just some eschar and slough buildup. The left plantar wound has a fair amount of undermining, but the surface is clean. There is some periwound callus accumulation. On the right plantar foot, there is nonviable fat leading to a deep tunnel that tracks towards his dorsal medial foot. There is periwound callus and slough accumulation, as well. His right foot and leg remain swollen as compared to the left. 10/25/2021: The  wounds on his bilateral dorsal feet and at the ankles have broken down somewhat. They are little bit larger than last week. The left plantar wound continues to undermine laterally but the surface is clean. The right plantar foot wound shows some decreased depth in the tunnel tracking towards his dorsal medial foot. He has not yet had the Doppler study that I ordered; it sounds like there is some confusion about the scheduling of the procedure. In addition, the MRI was denied and I have taken steps to appeal the denial. 11/24/2021: Since our last visit, Mr. Artrip was admitted to the hospital where an MRI suggested osteomyelitis. He was taken the operating room by podiatry. Bone biopsies were negative for osteomyelitis. They debrided his wounds and applied myriad matrix. He saw them last week and they removed his staples. He is here today to continue his wound healing process. T oday, both of the dorsal foot/ankle wounds are substantially smaller. There is just a little eschar overlying the left sided wound and some eschar and slough on the right. The right plantar foot ulcer has the healthiest surface of granulation tissue that I have seen to date. A portion of the myriad matrix failed to take and was hanging loose. It appears that myriad morcells were placed into the tunnel closest to [place], [name] ([id number]) [id number] Page 18 of 23 the dorsal portion of his foot. These have sloughed off. The left plantar foot ulcer is about the same size, but has a much healthier surface than in  the past. Both plantar ulcers have callus and slough accumulation. 12/07/2021: Left dorsal foot/ankle wound is closed. The right dorsal foot/ankle wound is nearly closed and just has a small open area with some eschar and slough. The right plantar foot wound has contracted quite a bit since our last visit. It has a healthy surface with just a little bit of slough accumulation and periwound  callus buildup. The left plantar foot wound is about the same size but the surface appears healthy. There is a little slough and periwound callus on this side, as well. 12/15/2021: Both dorsal foot/ankle wounds are closed. The right plantar foot wound is substantially smaller than at our last visit. The tunneling that was present has nearly closed. There is just a little bit of slough buildup. The left plantar foot wound is also a little bit smaller today. The surface is the healthiest that I have ever seen it. Light slough and periwound callus accumulation on this side. 12/22/2021: The dorsal ankle/foot wounds remain closed. The right plantar foot wound continues to contract. There is still a bit of depth at the lateral portion of the wound but the surface has a good granulated appearance. The wound on the left is about the same size, the central indented portion is still adherent. It also is clean with good granulation tissue present. The periwound skin and callus are a bit macerated, however. 12/29/2021: Both ankle wounds remain closed. The right plantar foot wound is less than half the size that it was last week. There is no depth any longer and the surface has just a little bit of slough and periwound callus. On the left, the wound is not much smaller, but the surface is healthier with good granulation tissue. There is also a little slough and periwound callus buildup. 01/05/2022: The right plantar foot wound continues to contract and is once again about half the size as it was last week. There is just a little bit of surface slough and periwound callus. On the left, the depth of the wound has decreased and the diameter has started to contract. It is also very clean with just a little slough and biofilm present. 01/12/2022: The right plantar foot wound is smaller again today. There is just some periwound callus and slough accumulation. On the left, there is a fair amount of undermining and the  surface is a little boggy, but no other significant change. 01/19/2022: The right plantar wound continues to contract. There is minimal periwound callus accumulation and just a light layer of slough on the surface. On the left, the surface looks better, but there is fairly significant undermining near the 11:00 portion of the wound, aiming towards his great toe. The skin overlying this area is very healthy and has a good fat layer present. No malodor or purulent drainage. 01/26/2022: The right sided wound continues to contract and has just a light layer of slough on the surface. Minimal periwound callus. On the left, he still has substantial undermining and the tissue surface is not as robust as I would like to see. No malodor or purulent drainage, but he did say he had a lot of serous drainage over the weekend. 02/02/2022: The right foot wound is about a quarter of the size that it was at his last visit. There is just a little slough on the surface and some periwound callus. On the left, the undermining persists and the tissue is still more pale, but he does not seem to have had as  much drainage over the last weekend. We are waiting on a wound VAC. 02/09/2022: His right foot has healed. He received the wound VAC, but did not bring it to clinic with him today. The lateral aspect of the left foot wound has come in a little, but he still has substantial undermining on the medial and distal portion of the wound. Still with macerated periwound callus. 02/16/2022: The wound VAC was applied last Friday. T oday, there has been tremendous improvement in the surface of the wound bed. The undermined portion of the wound has come in a bit. There is still some overhanging dead skin. Overall the wound looks much better. 02/23/2022: No significant change in the overall wound dimensions, but the wound surface continues to improve and appears healthier and more robust. There is some periwound callus accumulation and  overhanging skin. No concern for infection. 03/02/2022: Although the measurements taken in clinic today were unchanged, the visual appearance of the wound looks like it is smaller and the undermining/tunneling is filling with flesh. There is less periwound tissue maceration than usual. No malodor or purulent drainage. No concern for infection. 03/09/2022: The undermining has come in by couple of millimeters. The periwound is a little bit more macerated this week. No concern for infection. 12/13; substantial wound on the left plantar foot. We are using silver collagen and a wound VAC. 03/23/2022: The undermining continues to contract. The wound surface is a little bit dry. 03/30/2022: The undermining has essentially resolved. There is still some depth at the center of the wound. The wound surface has good beefy tissue and the moisture balance is better. 04/06/2022: The wound dimensions are about the same, except that the depth is beginning to fill in. He has had more drainage this week, but there is no obvious concern for infection. 04/13/2022: He has built up a little bit of callus around the edges of the wound, creating some undermining. Otherwise the wound looks fairly unchanged. 04/20/2022: The wound bed tissue looks very healthy and robust. Light biofilm on the surface. There is some built up wet callus around the wound edges, but no undermining. 04/27/2022: The wound surface continues to look very healthy. Although the wound dimensions measured the same, it looks smaller to me. No real callus accumulation this week. Minimal slough on the surface. 05/04/2022: The wound measured smaller and shallower this week. We are still awaiting insurance approval for Apligraf. 05/11/2022: The wound is about the same in terms of depth but measured a little bit narrower today. He has built up a rim of moist callus around the edges. He has been approved for Apligraf and we will apply that today. 05/18/2022: The wound  measured a little bit smaller again today. No significant callus buildup this week. 05/25/2022: The wound was measured about the same size today, but the multiple nooks and crannies in the depths of the wound seem to be filling in. Patient History Information obtained from Patient. Family History No family history of Cancer, Diabetes, Hereditary Spherocytosis, Hypertension, Kidney Disease, Lung Disease, Seizures, Stroke, Thyroid Problems, Tuberculosis. Social History Never smoker, Marital Status - Single, Alcohol Use - Rarely, Drug Use - No History, Caffeine Use - Never. Medical History [place], [name] ([id number]) [id number] Page 19 of 23 Eyes Denies history of Cataracts, Glaucoma, Optic Neuritis Ear/Nose/Mouth/Throat Denies history of Chronic sinus problems/congestion, Middle ear problems Hematologic/Lymphatic Denies history of Anemia, Hemophilia, Human Immunodeficiency Virus, Lymphedema, Sickle Cell Disease Respiratory Denies history of Aspiration, Asthma, Chronic Obstructive Pulmonary Disease (COPD), Pneumothorax,  Sleep Apnea, Tuberculosis Cardiovascular Denies history of Angina, Arrhythmia, Congestive Heart Failure, Coronary Artery Disease, Deep Vein Thrombosis, Hypertension, Hypotension, Myocardial Infarction, Peripheral Arterial Disease, Peripheral Venous Disease, Phlebitis, Vasculitis Gastrointestinal Denies history of Cirrhosis , Colitis, Crohnoos, Hepatitis A, Hepatitis B, Hepatitis C Endocrine Patient has history of Type II Diabetes Denies history of Type I Diabetes Immunological Denies history of Lupus Erythematosus, Raynaudoos, Scleroderma Integumentary (Skin) Denies history of History of Burn Musculoskeletal Denies history of Gout, Rheumatoid Arthritis, Osteoarthritis, Osteomyelitis Neurologic Denies history of Dementia, Neuropathy, Quadriplegia, Paraplegia, Seizure Disorder Oncologic Denies history of Received Chemotherapy, Received  Radiation Psychiatric Denies history of Anorexia/bulimia, Confinement Anxiety Hospitalization/Surgery History - 11/1-11/06/2018- sepsis foot infection. - 11/4-11/5 02 sats low respiratory distress. Objective Constitutional no acute distress. Vitals Time Taken: 8:40 AM, Height: 77 in, Weight: 280 lbs, BMI: 33.2, Temperature: 97.8 F, Pulse: 89 bpm, Respiratory Rate: 18 breaths/min, Blood Pressure: 133/87 mmHg, Capillary Blood Glucose: 76 mg/dl. General Notes: glucose per pt report this am Respiratory Normal work of breathing on room air. General Notes: 05/25/2022: The wound was measured about the same size today, but the multiple nooks and crannies in the depths of the wound seem to be filling in. Integumentary (Hair, Skin) Wound #3 status is Open. Original cause of wound was Trauma. The date acquired was: 10/02/2019. The wound has been in treatment 134 weeks. The wound is located on the [place]. The wound measures 2.8cm length x 3.1cm width x 1.8cm depth; 6.817cm^2 area and 12.271cm^3 volume. There is tendon and Fat Layer (Subcutaneous Tissue) exposed. There is no tunneling or undermining noted. There is a medium amount of serosanguineous drainage noted. Foul odor after cleansing was noted. The wound margin is thickened. There is large (67-100%) red granulation within the wound bed. There is a small (1- 33%) amount of necrotic tissue within the wound bed including Adherent Slough. The periwound skin appearance had no abnormalities noted for color. The periwound skin appearance exhibited: Callus, Maceration. Periwound temperature was noted as No Abnormality. 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 There was a Excisional Skin/Subcutaneous Tissue/Muscle Debridement with a total area of 8.68 sq cm performed by [name], MD. With the following instrument(s): Curette to remove Viable and Non-Viable tissue/material. Material removed includes [place], [name] ([id number]) [id number] Page 20 of 23 Callus, Fascia, Skin: Epidermis, and Biofilm. No specimens were taken. A time out was conducted at 08:55, prior to the start of the procedure. A Minimum amount of bleeding was controlled with Pressure. The procedure was tolerated well with a pain level of 0 throughout and a pain level of 0 following the procedure. Post Debridement Measurements: 2.8cm length x 3.1cm width x 1.8cm depth; 12.271cm^3 volume. Character of Wound/Ulcer Post Debridement is improved. Severity of Tissue Post Debridement is: Fat layer exposed. Post procedure Diagnosis Wound #3: Same as Pre-Procedure General Notes: Scribed for Dr. [name] by Baruch Gouty, RN. Pre-procedure diagnosis of Wound #3 is a Diabetic Wound/Ulcer of the Lower Extremity located on the Left,Lateral,Plantar Foot. A skin graft procedure using a bioengineered skin substitute/cellular or tissue based product was performed by [name], MD with the following instrument(s): Forceps and Scissors. Apligraf was applied and secured with Steri-Strips. 42 sq cm of product was utilized  and 2 sq cm was wasted due to wound size. Post Application, adaptic, VAC was applied. A Time Out was conducted at 09:05, prior to the start of the procedure. The procedure was tolerated well with a pain level of 0 throughout and a pain level of 0 following the procedure. Post procedure Diagnosis Wound #3: Same as Pre-Procedure . Plan Follow-up Appointments: Return Appointment in 1 week. - Dr. [name] - Room 1 with [facility] Wednesday 2/28 @ 0815 am Cellular or Tissue Based Products: Cellular  or Tissue Based Product Type: - Apligraf #3 Cellular or Tissue Based Product applied to wound bed, secured with steri-strips, cover with Adaptic or Mepitel. (DO NOT REMOVE). - may change black sponge, leave steristrips in place Bathing/ Shower/ Hygiene: May shower and wash wound with soap and water. Negative Presssure Wound Therapy: Wound #3 Left,Lateral,Plantar Foot: Wound Vac to wound continuously at 135m/hg pressure - decrease to 100 mm/hg pressure Black Foam Edema Control - Lymphedema / SCD / Other: Avoid standing for long periods of time. Exercise regularly Compression stocking or Garment 20-30 mm/Hg pressure to: - to both legs daily Off-Loading: Open toe surgical shoe to: - Both feet Additional Orders / Instructions: Follow Nutritious Diet Non Wound Condition: Apply the following to affected area as directed: - continue to apply foam donut or cushion to healed right plantar foot WOUND #3: - Foot Wound Laterality: Plantar, Left, Lateral Cleanser: Soap and Water 3 x Per Week/30 Days Discharge Instructions: May shower and wash wound with dial antibacterial soap and water prior to dressing change. Peri-Wound Care: Skin Prep (Generic) 3 x Per Week/30 Days Discharge Instructions: Use skin prep as directed Prim Dressing: Apligraf 3 x Per Week/30 Days ary Prim Dressing: VAC 3 x Per Week/30 Days ary Secondary Dressing: ADAPTIC TOUCH 3x4.25 in 3 x Per Week/30 Days Discharge Instructions: Apply over primary dressing as directed. Secondary Dressing: Zetuvit Plus 4x8 in (Dispense As Written) 3 x Per Week/30 Days Discharge Instructions: Apply over primary dressing as directed. Secured With: Elastic Bandage 4 inch (ACE bandage) (Generic) 3 x Per Week/30 Days Discharge Instructions: Secure with ACE bandage as directed. Secured With: 67M Medipore SPublic affairs consultantSurgical T 2x10 (in/yd) (Generic) 3 x Per Week/30 Days ape Discharge Instructions: Secure with tape as directed. Com pression Wrap:  Kerlix Roll 4.5x3.1 (in/yd) (Generic) 3 x Per Week/30 Days Discharge Instructions: Apply Kerlix and Coban compression as directed. 05/25/2022: The wound was measured about the same size today, but the multiple nooks and crannies in the depths of the wound seem to be filling in. I used a curette to debride slough and biofilm, as well as some nonviable muscle fascia from the wound. Apligraf #3 was then fenestrated and tucked into the wound, making sure to pack it into all of the remaining crevices. It was secured with Adaptic and Steri-Strips. His wound VAC was then applied by the nurse. Follow-up in 1 week. Electronic Signature(s) Signed: 05/25/2022 9:19:41 AM By: CFredirick MaudlinMD FACS Entered By: CFredirick Maudlinon 05/25/2022 09:19:40 HxROS Details -------------------------------------------------------------------------------- Porcelli, CMali([id number] Page 21 of 23 Patient Name: Date of Service: AThom ChimesD 05/25/2022 8:15 A M Medical Record Number: [id number] Account Number: [id number] of Birth/Sex: Treating RN: 6August 27, 1987(37y.o. M) Primary Care Provider: PSeward CarolOther Clinician: Referring Provider: Treating Provider/Extender: COsborn Cohoin Treatment: 151Information Obtained From Patient Eyes Medical History: Negative for: Cataracts; Glaucoma; Optic Neuritis Ear/Nose/Mouth/Throat Medical History: Negative for: Chronic sinus problems/congestion; Middle ear  problems Hematologic/Lymphatic Medical History: Negative for: Anemia; Hemophilia; Human Immunodeficiency Virus; Lymphedema; Sickle Cell Disease Respiratory Medical History: Negative for: Aspiration; Asthma; Chronic Obstructive Pulmonary Disease (COPD); Pneumothorax; Sleep Apnea; Tuberculosis Cardiovascular Medical History: Negative for: Angina; Arrhythmia; Congestive Heart Failure; Coronary Artery Disease; Deep Vein Thrombosis;  Hypertension; Hypotension; Myocardial Infarction; Peripheral Arterial Disease; Peripheral Venous Disease; Phlebitis; Vasculitis Gastrointestinal Medical History: Negative for: Cirrhosis ; Colitis; Crohns; Hepatitis A; Hepatitis B; Hepatitis C Endocrine Medical History: Positive for: Type II Diabetes Negative for: Type I Diabetes Time with diabetes: 8 Treated with: Insulin Blood sugar tested every day: No Immunological Medical History: Negative for: Lupus Erythematosus; Raynauds; Scleroderma Integumentary (Skin) Medical History: Negative for: History of Burn Musculoskeletal Medical History: Negative for: Gout; Rheumatoid Arthritis; Osteoarthritis; Osteomyelitis Neurologic Medical History: Negative for: Dementia; Neuropathy; Quadriplegia; Paraplegia; Seizure Disorder Oncologic Medical History: Negative for: Received Chemotherapy; Received Radiation Psychiatric Medical History: [place], [name] ([id number]) [id number] Page 22 of 23 Negative for: Anorexia/bulimia; Confinement Anxiety Immunizations Pneumococcal Vaccine: Received Pneumococcal Vaccination: No Implantable Devices None Hospitalization / Surgery History Type of Hospitalization/Surgery 11/1-11/06/2018- sepsis foot infection 11/4-11/5 02 sats low respiratory distress Family and Social History Cancer: No; Diabetes: No; Hereditary Spherocytosis: No; Hypertension: No; Kidney Disease: No; Lung Disease: No; Seizures: No; Stroke: No; Thyroid Problems: No; Tuberculosis: No; Never smoker; Marital Status - Single; Alcohol Use: Rarely; Drug Use: No History; Caffeine Use: Never; Financial Concerns: No; Food, Clothing or Shelter Needs: No; Support System Lacking: No; Transportation Concerns: No Electronic Signature(s) Signed: 05/25/2022 10:33:46 AM By: [name] MD FACS Entered By: [name] on 05/25/2022  09:17:58 -------------------------------------------------------------------------------- SuperBill Details Patient Name: Date of Service: [name], [name] 05/25/2022 Medical Record Number: [id number] Patient Account Number: [id number] Date of Birth/Sex: Treating RN: [birth year] (37 y.o. 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 Modifier: ity Quantity: 1 Physician Procedures : CPT4 Code Description Modifier [id number] - WC PHYS LEVEL 4 - EST PT 25 ICD-10 Diagnosis Description L97.528 Non-pressure chronic ulcer of other part of left foot with other specified severity E11.621 Type 2 diabetes mellitus with foot ulcer Quantity: 1 : [id number] - WC PHYS SKIN SUB GRAFT FACE/NK/HF/G ICD-10 Diagnosis Description L97.528 Non-pressure chronic ulcer of other part of left foot with other specified severity Quantity: 1 Electronic Signature(s) [place], [name] ([id number] Page 23 of 23 Signed: 05/25/2022 9:20:09 AM By: [name] MD FACS Entered By: [name] on 05/25/2022 09:20:09

## 2022-06-01 ENCOUNTER — Encounter (HOSPITAL_BASED_OUTPATIENT_CLINIC_OR_DEPARTMENT_OTHER): Payer: BC Managed Care – PPO | Admitting: General Surgery

## 2022-06-01 DIAGNOSIS — E11621 Type 2 diabetes mellitus with foot ulcer: Secondary | ICD-10-CM | POA: Diagnosis not present

## 2022-06-02 NOTE — Progress Notes (Signed)
[name], [name] ([id number]) [id number] Page 1 of 22 Visit Report for 06/01/2022 Chief Complaint Document Details Patient Name: Date of Service: [name] [name] [name] 06/01/2022 8:15 Max M Medical Record Number: [id number] Patient Account Number: [id number] Date of Birth/Sex: Treating RN: 03-16-1986 (37 y.o. M) Primary Care Provider: Seward [name] Other Clinician: Referring Provider: Treating Provider/Extender: [name] [name] in Treatment: [place] from: Patient Chief Complaint 01/11/2019; patient is here for review of Max rather substantial wound over the left fifth plantar metatarsal head extending into the lateral part of his foot 10/24/2019; patient returns to clinic with wounds on his bilateral feet with underlying osteomyelitis biopsy-proven Electronic Signature(s) Signed: 06/01/2022 9:11:48 AM By: [name] Maudlin MD FACS Entered By: [name] [name] on 06/01/2022 09:11:48 -------------------------------------------------------------------------------- Cellular or Tissue Based Product Details Patient Name: Date of Service: [name] [name] 06/01/2022 8:15 Max M Medical Record Number: [id number] Patient Account Number: [id number] Date of Birth/Sex: Treating RN: 1986-02-04 (37 y.o. [name] Primary Care Provider: Seward [name] Other Clinician: Referring Provider: Treating Provider/Extender: [name] [name] in Treatment: 135 Cellular or Tissue Based Product Type Wound #3 Left,Lateral,Plantar Foot Applied to: Performed By: Physician [name] Maudlin, MD Cellular or Tissue Based Product Type: Apligraf Level of Consciousness (Pre-procedure): Awake and Alert Pre-procedure Verification/Time Out Yes - 09:00 Taken: Location: genitalia / hands / feet / multiple digits Wound Size (sq cm): 7.54 Product Size (sq cm): 44 Waste Size (sq cm): 22 Waste Reason: wound size Amount of Product Applied (sq  cm): 22 Instrument Used: Forceps, Scissors Lot #: [id number] Order #: 4 Expiration Date: 06/03/2022 Fenestrated: Yes Instrument: Blade Reconstituted: Yes Solution Type: saline Solution Amount: 10 ml Lot #: [id number] Solution Expiration Date: 09/01/2024 Secured: Yes Secured With: Steri-Strips Dressing Applied: Yes Primary Dressing: adaptic, VAC Procedural Pain: 0 Post Procedural Pain: 0 Response to Treatment: Procedure was tolerated well Level of Consciousness (Post- Awake and Alert procedure): Post Procedure Diagnosis Same as Pre-procedure [name], [name] ([id number] Page 2 of 22 Electronic Signature(s) Signed: 06/01/2022 9:23:49 AM By: [name] Maudlin MD FACS Signed: 06/01/2022 4:05:52 PM By: [name] Gouty RN, BSN Entered By: [name] [name] on 06/01/2022 09:07:24 -------------------------------------------------------------------------------- Debridement Details Patient Name: Date of Service: [name] [name], [name] 06/01/2022 8:15 Max M Medical Record Number: [id number] Patient Account Number: [id number] Date of Birth/Sex: Treating RN: Aug 16, 1985 (37 y.o. [name] Primary Care Provider: Seward [name] Other Clinician: Referring Provider: Treating Provider/Extender: [name] [name] in Treatment: 135 Debridement Performed for Assessment: Wound #3 Left,Lateral,Plantar Foot Performed By: Physician [name] Maudlin, MD Debridement Type: Debridement Severity of Tissue Pre Debridement: Fat layer exposed Level of Consciousness (Pre-procedure): Awake and Alert Pre-procedure Verification/Time Out Yes - 09:00 Taken: Start Time: 09:00 Pain Control: Lidocaine 4% T opical Solution T Area Debrided (L x W): otal 2.6 (cm) x 2.9 (cm) = 7.54 (cm) Tissue and other material debrided: Viable, Non-Viable, Callus, Subcutaneous, Skin: Epidermis Level: Skin/Subcutaneous Tissue Debridement Description:  Excisional Instrument: Curette Bleeding: Minimum Hemostasis Achieved: Pressure Procedural Pain: 0 Post Procedural Pain: 0 Response to Treatment: Procedure was tolerated well Level of Consciousness (Post- Awake and Alert procedure): Post Debridement Measurements of Total Wound Length: (cm) 2.6 Width: (cm) 2.9 Depth: (cm) 1.8 Volume: (cm) 10.659 Character of Wound/Ulcer Post Debridement: Improved Severity of Tissue Post Debridement: Fat layer exposed Post Procedure Diagnosis Same as Pre-procedure Notes Scribed for [name] [name] by [name] Gouty, RN Electronic Signature(s) Signed: 06/01/2022 9:23:49 AM By: [name] Maudlin MD FACS Signed: 06/01/2022 4:05:52  PM By: [name] Gouty RN, BSN Entered By: [name] [name] on 06/01/2022 09:03:59 -------------------------------------------------------------------------------- HPI Details Patient Name: Date of Service: [name] [name] 06/01/2022 8:15 Max M Medical Record Number: [id number] Patient Account Number: [id number] Date of Birth/Sex: Treating RN: 1985/04/08 (37 y.o. 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 He comes in for review of Max wound over the plantar fifth metatarsal head extending into the lateral part of the foot. He was followed for this previously by his podiatrist Dr. Cornelius [name]. As the patient tells his story he went to see podiatry first for Max swelling he developed on the lateral part of his fifth metatarsal head in May. He states this was "open" by podiatry and the area closed. He was followed up in June and it was again opened callus removed and it closed promptly. There were  plans being made for surgery on the fifth metatarsal head in June however his blood sugar was apparently too high for anesthesia. Apparently the area was debrided and opened again in June and it is never closed since. Looking over the records from podiatry I am really not able to follow this. It was clear when he was first seen it was before 5/14 at that point he already had Max wound. By 5/17 the ulcer was resolved. I do not see anything about Max procedure. On 5/28 noted to have pre-ulcerative moderate keratosis. X-ray noted 1/5 contracted toe and tailor's bunion and metatarsal deformity. On Max visit date on 09/28/2018 the dorsal part of the left foot it healed and resolved. There was concern about swelling in his lower extremity he was sent to the ER.. As far as I can tell he was seen in the ER on 7/12 with an ulcer on his left foot. Max DVT rule out of the left leg was negative. I do not think I have complete records from podiatry but I am not able to verify the procedures this patient states he had. He states after the last procedure the wound has never closed although I am not able to follow this in the records I have from podiatry. He has not had Max recent x-ray The patient has been using Neosporin on the wound. He is wearing Max Darco shoe. He is still very active up on his foot working and exercising. Past medical history; type 2 diabetes ketosis-prone, leg swelling with Max negative DVT study in July. Non-smoker ABI in our clinic was 0.85 on the left 10/16; substantial wound on the plantar left fifth met head extending laterally almost to the dorsal fifth MTP. We have been using silver alginate we gave him Max Darco forefoot off loader. An x-ray did not show evidence of osteomyelitis did note soft tissue emphysema which I think was due to gas tracking through an open wound. There is no doubt in my mind he requires an MRI 10/23; MRI not booked until 3 November at the earliest this is largely due to his  glucose sensor in the right arm. We have been using silver alginate. There has been an improvement 10/29; I am still not exactly sure when his MRI is booked for. He says it is the third but it is the 10th in epic. This definitely needs to be  done. He is running Max low-grade fever today but no other symptoms. No real improvement in the 1 02/26/2019 patient presents today for Max follow-up visit here in our clinic he is last been seen in the clinic on October 29. Subsequently we were working on getting MRI to evaluate and see what exactly was going on and where we would need to go from the standpoint of whether or not he had osteomyelitis and again what treatments were going be required. Subsequently the patient ended up being admitted to the hospital on 02/07/2019 and was discharged on 02/14/2019. This is Max somewhat interesting admission with Max discharge diagnosis of pneumonia due to COVID-19 although he was positive for COVID-19 when tested at the urgent care but negative x2 when he was actually in the hospital. With that being said he did have acute respiratory failure with hypoxia and it was noted he also have Max left foot ulceration with osteomyelitis. With that being said he did require oxygen for his pneumonia and I level 4 L. He was placed on antivirals and steroids for the COVID-19. He was also transferred to the [facility] at one point. Nonetheless he did subsequently discharged home and since being home has done much better in that regard. The CT angiogram did not show any pulmonary embolism. With regard to the osteomyelitis the patient was placed on vancomycin and Zosyn while in the hospital but has been changed to Augmentin at discharge. It was also recommended that he follow- up with wound care and podiatry. Podiatry however wanted him to see [name] according to the patient prior to them doing anything further. His hemoglobin A1c was 9.9 as noted in the hospital. Have an MRI of the left  foot performed while in the hospital on 02/04/2019. This showed evidence of septic arthritis at the fifth MTP joint and osteomyelitis involving the fifth metatarsal head and proximal phalanx. There is an overlying plantar open wound noted an abscess tracking back along the lateral aspect of the fifth metatarsal shaft. There is otherwise diffuse cellulitis and mild fasciitis without findings of polymyositis. The patient did have recently pneumonia secondary to COVID-19 I looked in the chart through epic and it does appear that the patient may need to have an additional x-ray just to ensure everything is cleared and that he has no airspace disease prior to putting him into the [name]. 03/05/2019; patient was readmitted to the clinic last week. He was hospitalized twice for Max viral upper respiratory tract infection from 11/1 through 11/4 and then 11/5 through 11/12 ultimately this turned out to be Covid pneumonitis. Although he was discharged on oxygen he is not using it. He says he feels fine. He has no exercise limitation no cough no sputum. His O2 sat in our clinic today was 100% on room air. He did manage to have his MRI which showed septic arthritis at the fifth MTP joint and osteomyelitis involving the fifth metatarsal head and proximal phalanx. He received Vanco and Zosyn in the hospital and then was discharged on 2 weeks of Augmentin. I do not see any relevant cultures. He was supposed to follow-up with infectious disease but I do not see that he has an appointment. 12/8; patient saw Dr. Novella Olive of infectious disease last week. He felt that he had had adequate antibiotic therapy. He did not go to follow-up with Dr. Amalia Hailey of podiatry and I have again talked to him about the pros and cons of this. He does not want to  consider Max ray amputation of this time. He is aware of the risks of recurrence, migration etc. He started HBO today and tolerated this well. He can complete the Augmentin that I gave him  last week. I have looked over the lab work that Dr. Chana Bode ordered his C-reactive protein was 3.3 and his sedimentation rate was 17. The C-reactive protein is never really been measurably that high in this patient 12/15; not much change in the wound today however he has undermining along the lateral part of the foot again more extensively than last week. He has some rims of epithelialization. We have been using silver alginate. He is undergoing hyperbarics but did not dive today 12/18; in for his obligatory first total contact cast change. Unfortunately there was pus coming from the undermining area around his fifth metatarsal head. This was cultured but will preclude reapplication of Max cast. He is seen in conjunction with HBO 12/24; patient had staph lugdunensis in the wound in the undermining area laterally last time. We put him on doxycycline which should have covered this. The wound looks better today. I am going to give him another week of doxycycline before reattempting the total contact cast 12/31; the patient is completing antibiotics. Hemorrhagic debris in the distal part of the wound with some undermining distally. He also had hyper granulation. Extensive debridement with Max #5 curette. The infected area that was on the lateral part of the fifth met head is closed over. I do not think he needs any more antibiotics. Patient was seen prior to HBO. Preparations for Max total contact cast were made in the cast will be placed post hyperbarics 04/11/19; once again the patient arrives today without complaint. He had been in Max cast all week noted that he had heavy drainage this week. This resulted in large raised areas of macerated tissue around the wound 1/14; wound bed looks better slightly smaller. Hydrofera Blue has been changing himself. He had Max heavy drainage last week which caused Max lot of maceration around the wound so I took him out of Max total contact cast he says the drainage is actually  better this week He is seen today in conjunction with HBO 1/21; returns to clinic. He was up in [place] for Max day or 2 attending Max funeral. He comes back in with the wound larger and with Max large area of exposed bone. He had osteomyelitis and septic arthritis of the fifth left metatarsal head while he was in hospital. He received IV antibiotics in the hospital for Max prolonged period of time then 3 weeks of Augmentin. Subsequently I gave him 2 weeks of doxycycline for more superficial wound infection. When I saw this last week the wound was smaller the surface of the wound looks satisfactory. 1/28; patient missed hyperbarics today. Bone biopsy I did last time showed Enterococcus faecalis and Staphylococcus lugdunensis . He has Max wide area of exposed bone. We are going to use silver alginate as of today. I had another ethical discussion with the patient. This would be recurrent osteomyelitis he is already received IV antibiotics. In this situation I think the likelihood of healing this is low. Therefore I have recommended Max ray amputation and with the patient's agreement I have referred him to Dr. Doran Durand. The other issue is that his compliance with hyperbarics has been minimal because of his work schedule and given his underlying decision I am going to stop this today READMISSION 10/24/2019 MRI 09/29/2019 left foot IMPRESSION: 1. Apparent skin  ulceration inferior and lateral to the 5th metatarsal base with underlying heterogeneous T2 signal and [name], [name] ([id number]) [phone].pdf Page 4 of 22 enhancement in the subcutaneous fat. Small peripherally enhancing fluid collections along the plantar and lateral aspects of the 5th metatarsal base suspicious for abscesses. 2. Interval amputation through the mid 5th metatarsal with nonspecific low-level marrow edema and enhancement. Given the proximity to the adjacent soft tissue inflammatory changes, osteomyelitis cannot  be excluded. 3. The additional bones appear unremarkable. MRI 09/29/2019 right foot IMPRESSION: 1. Soft tissue ulceration lateral to the 5th MTP joint. There is low-level T2 hyperintensity within the 4th and 5th metatarsal heads and adjacent proximal phalanges without abnormal T1 signal or cortical destruction. These findings are nonspecific and could be seen with early marrow edema, hyperemia or early osteomyelitis. No evidence of septic joint. 2. Mild tenosynovitis and synovial enhancement associated with the extensor digitorum tendons at the level of the midfoot. 3. Diffuse low-level muscular T2 hyperintensity and enhancement, most consistent with diabetic myopathy. LEFT FOOT BONE Methicillin resistant staphylococcus aureus Staphylococcus lugdunensis MIC MIC CIPROFLOXACIN >=8 RESISTANT Resistant <=0.5 SENSI... Sensitive CLINDAMYCIN <=0.25 SENS... Sensitive >=8 RESISTANT Resistant ERYTHROMYCIN >=8 RESISTANT Resistant >=8 RESISTANT Resistant GENTAMICIN <=0.5 SENSI... Sensitive <=0.5 SENSI... Sensitive Inducible Clindamycin NEGATIVE Sensitive NEGATIVE Sensitive OXACILLIN >=4 RESISTANT Resistant 2 SENSITIVE Sensitive RIFAMPIN <=0.5 SENSI... Sensitive <=0.5 SENSI... Sensitive TETRACYCLINE <=1 SENSITIVE Sensitive <=1 SENSITIVE Sensitive TRIMETH/SULFA <=10 SENSIT Sensitive <=10 SENSIT Sensitive ... Marland Kitchen.. VANCOMYCIN 1 SENSITIVE Sensitive <=0.5 SENSI... Sensitive Right foot bone . Component 3 wk ago Specimen Description BONE Special Requests RIGHT 4 METATARSAL SAMPLE B Gram Stain NO WBC SEEN NO ORGANISMS SEEN Culture RARE METHICILLIN RESISTANT STAPHYLOCOCCUS AUREUS NO ANAEROBES ISOLATED Performed at [facility], [place] [address] [place], [place] [address] Report Status 10/08/2019 FINAL Organism ID, Bacteria METHICILLIN RESISTANT STAPHYLOCOCCUS AUREUS Resulting Agency [facility] Susceptibility Methicillin resistant staphylococcus aureus MIC CIPROFLOXACIN >=8 RESISTANT  Resistant CLINDAMYCIN <=0.25 SENS... Sensitive ERYTHROMYCIN >=8 RESISTANT Resistant GENTAMICIN <=0.5 SENSI... Sensitive Inducible Clindamycin NEGATIVE Sensitive OXACILLIN >=4 RESISTANT Resistant RIFAMPIN <=0.5 SENSI... Sensitive TETRACYCLINE <=1 SENSITIVE Sensitive TRIMETH/SULFA <=10 SENSIT Sensitive ... VANCOMYCIN 1 SENSITIVE Sensitive This is Max patient we had in clinic earlier this year with Max wound over his left fifth metatarsal head. 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 Doran Durand on 05/16/2019. He seems to have developed problems with wounds on his bilateral feet in June 2021 although he may have had problems earlier than this. He was in an urgent care with Max right foot ulcer on 09/26/2019 and given Max course of doxycycline. This was apparently after having trouble getting into see orthopedics. He was seen by podiatry on 09/28/2019 noted to have bilateral lower extremity ulcers including the left lateral fifth metatarsal base and the right subfifth met head. It was noted that had purulent drainage at that time. He required hospitalization from 6/20 through 7/2. This was because of worsening right foot wounds. He underwent bilateral operative incision and drainage and bone biopsies bilaterally. Culture results are listed above. He has been referred back to clinic by Dr. Jacqualyn Posey of podiatry. He is also followed by Dr. Megan Salon who saw him yesterday. He was discharged from hospital on Zyvox Flagyl and Levaquin and yesterday changed to doxycycline  Flagyl and Levaquin. His inflammatory markers on 6/26 showed Max sedimentation rate of 129 and Max C-reactive protein of 5. This is improved to 14 and 1.3 respectively. This  would indicate improvement. ABIs in our clinic today were 1.23 on the right and 1.20 on the left 11/01/2019 on evaluation today patient appears to be doing fairly well in regard to the wounds on his feet at this point. Fortunately there is no signs of active infection at this time. No fevers, chills, nausea, vomiting, or diarrhea. He currently is seeing infectious disease and still under their care at this point. Subsequently he also has both wounds which she has not been using collagen on as he did not receive that in his packaging he did not call us and let us know that. Apparently that just was missed on the order. Nonetheless we will get that straightened out today. 8/9-Patient returns for bilateral foot wounds, using Prisma with hydrogel moistened dressings, and the wounds appear stable. Patient using surgical shoes, avoiding much pressure or weightbearing as much as possible 8/16; patient has bilateral foot wounds. 1 on the right lateral foot proximally the other is on the left mid lateral foot. Both required debridement of callus and thick skin around the wounds. We have been using silver collagen 8/27; patient has bilateral lateral foot wounds. The area on the left substantially surrounded by callus and dry skin. This was removed from the wound edge. The underlying wound is small. The area on the right measured somewhat smaller today. We've been using silver collagen [name], [name] ([id number]) [phone].pdf Page 5 of 22 the patient was on antibiotics for underlying osteomyelitis in the left foot. Unfortunately I did not update his antibiotics during today's visit. 9/10 I reviewed Dr. Hale Bogus last notes he felt he had completed antibiotics his inflammatory markers were reasonably well controlled. He has Max small wound on the lateral left foot and Max tiny area on the right which is just above closed. He is using Hydrofera Blue with border foam he has bilateral surgical  shoes 9/24; 2 week f/u. doing well. right foot is closed. left foot still undermined. 10/14; right foot remains closed at the fifth met head. The area over the base of the left fifth metatarsal has Max small open area but considerable undermining towards the plantar foot. Thick callus skin around this suggests an adequate pressure relief. We have talked about this. He says he is going to go back into his cam boot. I suggested Max total contact cast he did not seem enamored with this suggestion 10/26; left foot base of the fifth metatarsal. Same condition as last time. He has skin over the area with an open wound however the skin is not adherent. He went to see Dr. Earleen Newport who did an x-ray and culture of his foot I have not reviewed the x-ray but the patient was not told anything. He is on doxycycline 11/11; since the patient was last here he was in the emergency room on 10/30 he was concerned about swelling in the left foot. They did not do any cultures or x-rays. They changed his antibiotics to cephalexin. Previous culture showed group B strep. The cephalexin is appropriate as doxycycline has less than predictable coverage. Arrives in clinic today with swelling over this area under the wound. He also has Max new wound on the right fifth metatarsal head 11/18; the patient has Max difficult wound on the lateral aspect of the left fifth metatarsal head. The wound was almost ballotable  last week I opened it slightly expecting to see purulence however there was just bleeding. I cultured this this was negative. X-ray unchanged. We are trying to get an MRI but I am not sure were going to be able to get this through his insurance. He also has an area on the right lateral fifth metatarsal head this looks healthier 12/3; the patient finally got our MRI. Surprisingly this did not show osteomyelitis. I did show the soft tissue ulceration at the lateral plantar aspect of the fifth metatarsal base with Max tiny residual 6 mm  abscess overlying the superficial fascia I have tried to culture this area I have not been able to get this to grow anything. Nevertheless the protruding tissue looks aggravated. I suspect we should try to treat the underlying "abscess with broad-spectrum antibiotics. I am going to start him on Levaquin and Flagyl. He has much less edema in his legs and I am going to continue to wrap his legs and see him weekly 12/10. I started Levaquin and Flagyl on him last week. He just picked up the Flagyl apparently there was some delay. The worry is the wound on the left fifth metatarsal base which is substantial and worsening. His foot looks like he inverts at the ankle making this Max weightbearing surface. Certainly no improvement in fact I think the measurements of this are somewhat worse. We have been using 12/17; he apparently just got the Levaquin yesterday this is 2 weeks after the fact. He has completed the Flagyl. The area over the left fifth metatarsal base still has protruding granulation tissue although it does not look quite as bad as it did some weeks ago. He has severe bilateral lymphedema although we have not been treating him for wounds on his legs this is definitely going to require compression. There was so much edema in the left I did not wish to put him in Max total contact cast today. I am going to increase his compression from 3-4 layer. The area on the right lateral fifth met head actually look quite good and superficial. 12/23; patient arrived with callus on the right fifth met head and the substantial hyper granulated callused wound on the base of his fifth metatarsal. He says he is completing his Levaquin in 2 days but I do not think that adds up with what I gave him but I will have to double check this. We are using Hydrofera Blue on both areas. My plan is to put the left leg in Max cast the week after New Year's 04/06/2020; patient's wounds about the same. Right lateral fifth metatarsal  head and left lateral foot over the base of the fifth metatarsal. There is undermining on the left lateral foot which I removed before application of total contact cast continuing with Hydrofera Blue new. Patient tells me he was seen by endocrinology today lab work was done [Dr. Kerr]. Also wondering whether he was referred to cardiology. I went over some lab work from previously does not have chronic renal failure certainly not nephrotic range proteinuria he does have very poorly controlled diabetes but this is not his most updated lab work. Hemoglobin A1c has been over 11 1/10; the patient had Max considerable amount of leakage towards mid part of his left foot with macerated skin however the wound surface looks better the area on the right lateral fifth met head is better as well. I am going to change the dressing on the left foot under the total contact  cast to silver alginate, continue with Hydrofera Blue on the right. 1/20; patient was in the total contact cast for 10 days. Considerable amount of drainage although the skin around the wound does not look too bad on the left foot. The area on the right fifth metatarsal head is closed. Our nursing staff reports large amount of drainage out of the left lateral foot wound 1/25; continues with copious amounts of drainage described by our intake staff. PCR culture I did last week showed E. coli and Enterococcus faecalis and low quantities. Multiple resistance genes documented including extended spectrum beta lactamase, MRSA, MRSE, quinolone, tetracycline. The wound is not quite as good this week as it was 5 days ago but about the same size 2/3; continues with copious amounts of malodorous drainage per our intake nurse. The PCR culture I did 2 weeks ago showed E. coli and low quantities of Enterococcus. There were multiple resistance genes detected. I put Neosporin on him last week although this does not seem to have helped. The wound is slightly deeper  today. Offloading continues to be an issue here although with the amount of drainage she has Max total contact cast is just not going to work 2/10; moderate amount of drainage. Patient reports he cannot get his stocking on over the dressing. I told him we have to do that the nurse gave him suggestions on how to make this work. The wound is on the bottom and lateral part of his left foot. Is cultured predominantly grew low amounts of Enterococcus, E. coli and anaerobes. There were multiple resistance genes detected including extended spectrum beta lactamase, quinolone, tetracycline. I could not think of an easy oral combination to address this so for now I am going to do topical antibiotics provided by [facility] I think the main agents here are vancomycin and an aminoglycoside. We have to be able to give him access to the wounds to get the topical antibiotic on 2/17; moderate amount of drainage this is unchanged. He has his Keystone topical antibiotic against the deep tissue culture organisms. He has been using this and changing the dressing daily. Silver alginate on the wound surface. 2/24; using Keystone antibiotic with silver alginate on the top. He had too much drainage for Max total contact cast at one point although I think that is improving and I think in the next week or 2 it might be possible to replace Max total contact cast I did not do this today. In general the wound surface looks healthy however he continues to have thick rims of skin and subcutaneous tissue around the wide area of the circumference which I debrided 06/04/2020 upon evaluation today patient appears to be doing well in regard to his wound. I do feel like he is showing signs of improvement. There is little bit of callus and dead tissue around the edges of the wound as well as what appears to be Max little bit of Max sinus tract that is off to the side laterally I would perform debridement to clear that away today. 3/17; left lateral foot.  The wound looks about the same as I remember. Not much depth surface looks healthy. No evidence of infection 3/25; left lateral foot. Wound surface looks about the same. Separating epithelium from the circumference. There really is no evidence of infection here however not making progress by my view 3/29; left lateral foot. Surface of the wound again looks reasonably healthy still thick skin and subcutaneous tissue around the wound margins. There  is no evidence of infection. One of the concerns being brought up by the nurses has again the amount of drainage vis--vis continued use of Max total contact cast 4/5; left lateral foot at roughly the base of the fifth metatarsal. Nice healthy looking granulated tissue with rims of epithelialization. The overall wound measurements are not any better but the tissue looks healthy. The only concern is the amount of drainage although he has no surrounding maceration with what we have been doing recently to absorb fluid and protect his skin. He also has lymphedema. 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 He states that the kerlix/coban under the TCC rolled down and could not get it back up. He has been using an offloading scooter and has somehow hurt his right foot using this device. This happened last week. He states that the side of his right foot developed Max blister and opened. The top of his foot also has Max few small open wounds he thinks is due to his socks rubbing in his shoes. He has not been using any dressings to the wound. He denies purulent drainage, fever/chills or erythema to the wounds. 4/22; patient presents for 1 week follow-up. He developed new wounds to the right foot that were evaluated at last clinic  visit. He continues to have Max total contact cast to the left leg and he reports no issues. He has been using silver collagen to the right foot wounds with no issues. He denies purulent drainage, fever/chills or erythema to the right foot wounds. He has no complaints today 4/25; patient presents for 1 week follow-up. He has Max total contact cast of the left leg and reports no issues. He has been using silver alginate to the right foot wound. He denies purulent drainage, fever/chills or erythema to the right foot wounds. 5/2 patient presents for 1 week follow-up. T contact cast on the left. The wound which is on the base of the plantar foot at the base of the fifth metatarsal otal actually looks quite good and dimensions continue to gradually contract. Breach, [name] ([id number]) [id number] Page 6 of 22 HOWEVER the area on the right lateral fifth metatarsal head is much larger than what I remember from 2 weeks ago. Once more is he has significant levels of hypergranulation. Noteworthy that he had this same hyper granulated response on his wound on the left foot at one point in time. So much so that he I thought there was an underlying fluid collection. Based on this I think this just needs debridement. 5/9; the wound on the left actually continues to be gradually smaller with Max healthy surface. Slight amount of drainage and maceration of the skin around but not too bad. However he has Max large wound over the right fifth metatarsal head very much in the same configuration as his left foot wound was initially. I used silver nitrate to address the hyper granulated tissue no mechanical debridement 5/16; area on the left foot did not look as healthy this week deeper thick surrounding macerated skin and subcutaneous tissue. The area on the right foot fifth met head was about the same The area on the right ankle that we identified last week is completely broken down into an  open  wound presumably Max stocking rubbing issue 5/23; patient has been using Max total contact cast to the left side. He has been using silver alginate underneath. He has also been using silver alginate to the right foot wounds. He has no complaints today. He denies any signs of infection. 5/31; the left-sided wound looks some better measure smaller surface granulation looks better. We have been using silver alginate under the total contact cast The large area on his right fifth met head and right dorsal foot look about the same still using silver alginate 6/6; neither side is good as I was hoping although the surface area dimensions are better. Max lot of maceration on his left and right foot around the wound edge. Area on the dorsal right foot looks better. He says he was traveling. I am not sure what does the amount of maceration around the plantar wounds may be drainage issues 6/13; in general the wound surfaces look quite good on both sides. Macerated skin and raised edges around the wound required debridement although in general especially on the left the surface area seems improved. The area on the right dorsal ankle is about the same I thought this would not be such Max problem to close 6/20; not much change in either wound although the one on the right looks Max little better. Both wounds have thick macerated edges to the skin requiring debridements. We have been using silver alginate. The area on the dorsal right ankle is still open I thought this would be closed. 6/28; patient comes in today with Max marked deterioration in the right foot wound fifth met head. Wide area of exposed bone this is Max drastic change from last time. The area on the left there we have been casting is stagnant. We have been using silver alginate in both wound areas. 7/5; bone culture I did for PCR last time was positive for Pseudomonas, group B strep, Enterococcus and Staph aureus. There was no suggestion of methicillin resistance  or ampicillin resistant genes. This was resistant to tetracycline however He comes into the clinic today with the area over his right plantar fifth metatarsal head which had been doing so well 2 weeks ago completely necrotic feeling bone. I do not know that this is going to be salvageable. The left foot wound is certainly no smaller but it has Max better surface and is superficial. 7/8; patient called in this morning to say that his total contact cast was rubbing against his foot. He states he is doing fine overall. He denies signs of infection. 7/12; continued deterioration in the wound over the right fifth metatarsal head crumbling bone. This is not going to be salvageable. The patient agrees and wants to be referred to Dr. Doran Durand which we will attempt to arrange as soon as possible. I am going to continue him on antibiotics as long as that takes so I will renew those today. The area on the left foot which is the base of the fifth metatarsal continues to look somewhat better. Healthy looking tissue no depth no debridement is necessary here. 7/20; the patient was kindly seen by Dr. Doran Durand of orthopedics on 10/19/2020. 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 Towards this end we have taken him out of the total contact cast on the left we will put him in Max wrap with Hydrofera Blue. As I understand  things surgery is planned for 7/21 7/27; patient had his surgery last Thursday. 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 Apparently everything went well we did not still disturb that today The area on the left foot actually looks quite good. He has been much less mobile which probably explains this he did not seem to do well in the total contact cast secondary to drainage and maceration I think. We have been using Hydrofera Blue 11/09/2020 upon evaluation today patient appears to be doing well with regard to his  plantar foot ulcer on the left foot. Fortunately there is no evidence of active infection at this time. No fevers, chills, nausea, vomiting, or diarrhea. Overall I think that he is actually doing extremely well. Nonetheless I do believe that he is staying off of this more following the surgery in his right foot that is the reason the left is doing so great. 8/16; left plantar foot wound. This looks smaller than the last time I saw this he is using Hydrofera Blue. The surgical wound on the right foot is being followed by Dr. Doran Durand we did not look at this today. He has surgical shoes on both feet 8/23; left plantar foot wound not as good this week. Surrounding macerated skin and subcutaneous tissue everything looks moist and wet. I do not think he is offloading this adequately. He is using Max surgical shoe Apparently the right foot surgical wound is not open although I did not check his foot 8/31; left plantar foot lateral aspect. Much improved this week. He has no maceration. Some improvement in the surface area of the wound but most impressively the depth is come in we are using silver alginate. The patient is Max Product/process development scientist. He is asked that we write him Max letter so he can go back to work. I have also tried to see if we can write something that will allow him to limit the amount of time that he is on his foot at work. 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 Hopefully the latter can be avoided 9/6; I believe the patient missed an appointment last week. He arrives in today with Max wound looking roughly the same certainly no better. Undermining laterally and also inferiorly. We used molecuLight today in training with the patient's permission.. We are using silver alginate 9/21 wound is measuring bigger this week although this may have to do with the aggressive circumferential debridement last week in response to  the blush fluorescence on the MolecuLight. Culture I did last week showed significant MSSA and E. coli. I put him on Augmentin but he has not started it yet. We are also going to send this for compounded antibiotics at [facility]. There is no evidence of systemic infection 9/29; silver alginate. His Keystone arrived. He is completing Augmentin in 2 days. Offloading in Max cam boot. Moderate drainage per our intake staff 10/5; using silver alginate. He has been using his [place]. He has completed his Augmentin. Per our intake nurse still Max lot of drainage, far too much to consider Max total contact cast. Wound measures about the same. He had the same undermining area that I defined last week from Max roughly 11-3. I remove this today 10/12; using silver alginate he is using the [place]. He comes in for Max nurse visit hence we are applying [employer] twice Max week. Measuring slightly better today and less notable drainage.  Extensive debridement of the wound edge last time 10/18; using topical Keystone and silver alginate and Max soft cast. Wound measurements about the same. Drainage was through his soft cast. We are changing this twice Max week Tuesdays and Friday 10/25; comes in with moderate drainage. Still using Keystone silver alginate and Max soft cast. Wound dimensions completely the same.He has Max lot of edema in the left leg he has lymphedema. Asking for [name] to consider wrapping him as he cannot get his stocking on over the soft cast 11/2; comes in with moderate to large drainage slightly smaller in terms of width we have been using [place]. His wound looks satisfactory but not much improvement 11/4; patient presents today for obligatory cast change. Has no issues or complaints today. He denies signs of infection. 11/9; patient traveled this weekend to [place], was on the cast quite Max bit. Staining of the cast with black material from his walking boot. Drainage was not quite as bad as we feared. Using silver  alginate and Keystone 11/16; we do not have size for cast therefore we have been putting Max soft cast on him since the change on Friday. Still Max significant amount of drainage necessitating changing twice Max week. We have been using the [facility] at cast changes either hard or soft as well as silver alginate Garmon, [name] ([id number]) [id number].pdf Page 7 of 22 Comes in the clinic with things actually looking fairly good improvement in width. He says his offloading is about the same 02/24/2021 upon evaluation today patient actually comes back in and is doing excellent in regard to his foot ulcer this is significantly smaller even compared to the last visit. The soft cast seems to have done extremely well for him which is great news. I do not see any signs of infection minimal debridement will be needed today. 11/30; left lateral foot much improved half Max centimeter improvement in surface area. No evidence of infection. He seems to be doing better with the soft cast in the TCC therefore we will continue with this. He comes back in later in the week for Max change with the nurses. This is due to drainage 12/6; no improvement in dimensions. Under illumination some debris on the surface we have been using silver alginate, soft cast. If there is anything optimistic here he seems to have have less drainage 12/13. Dimensions are improved both length and width and slightly in depth. Appears to be quite healthy today. Raised edges of this thick skin and callus around the edges however. He is in Max soft cast were bringing him back once for Max change on Friday. Drainage is better 12/20. Dimensions are improved. He still has raised edges of thick skin and callus around the edges. We are using Max soft cast 12/28; comes in today with thick callus around the wound. Using silver under alginate under Max soft cast. I do not think there is much improvement in any measurement 2023 04/06/2021; patient  was put in Max total contact cast. Unfortunately not much change in surface area 1/10; not much different still thick callus and skin around the edge in spite of the total contact cast. This was just debrided last week we have been using the [facility] compounded antibiotic and silver alginate under Max total contact cast 1/18 the patient's wound on the left side is doing nicely. smaller HOWEVER he comes in today with Max wound on the right foot laterally. blister most likely serosangquenous drainage 1/24; the patient continues to do well  in terms of the plantar left foot which is continued to contract using silver alginate under the total contact cast HOWEVER the right lateral foot is bigger with denuded skin around the edges. I used pickups and Max #15 scalpel to remove this this looks like the remanence of Max large blister. Cannot rule out infection. Culture in this area I did last week showed Staphylococcus lugdunensis few colonies. I am going to try to address this with his [employer] antibiotic that is done so well on the left having linezolid and this should cover the staph 2/1; the patient's wound on his left foot which was the original plantar foot wound thick skin and eschar around the edges even in the total contact cast but the wound surface does not look too bad The real problem is on how his right lateral foot at roughly the base of the fifth metatarsal. The wound is completely necrotic more worrisome than that there is swelling around the edges of this. We have been using silver alginate on both wounds and Keystone on the right foot. Unfortunately I think he is going to require systemic antibiotics while we await cultures. He did not get the x-ray done that we ordered last week [lost the prescription 2/7; disappointingly in the area on the left foot which we are treating with Max total contact cast is still not closed although it is much smaller. He continues to have Max lot of callus around the wound  edge. -Right lateral foot culture I did last week was negative x-ray also negative for osteomyelitis. 2/15: TCC silver alginate on the left and silver alginate on the right lateral. No real improvement in either area 05/26/2021: T oday, the wounds are roughly the same size as at his previous visit, post-debridement. He continues to endorse fairly substantial drainage, particularly on the right. He has been in Max total contact cast on the left. There is still some callus surrounding this lesion. On the right, the periwound skin is quite macerated, along with surrounding callus. The center of the right-sided wound also has some dark, densely adherent material, which is very difficult to remove. 06/02/2021: Today, both wounds are slightly smaller. He has been using zinc oxide ointment around the right ulcer and the degree of maceration has improved markedly. There continues to be an area of nonviable tissue in the center of the right sided ulcer. The left-sided wound, which has been in the total contact cast. Appears clean and the degree of callus around it is less than previously. 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 He had to take the stairs and both wounds have increased in size. The left foot, which has been in Max total contact cast, has developed Max tunnel tracking to the lateral aspect of his foot. The nonviable tissue in the center of the right-sided ulcer remains recalcitrant to debridement. There is significant undermining surrounding the entirety of the left sided wound. 06/16/2021: The elevator at school has been fixed and the patient has been able to avoid putting as much weight on his wounds over the past week. We converted the left foot wound into Max single lesion today, but despite this, the wound is actually smaller. The base is healthy with limited periwound callus. On the right, the central necrotic area is still present. He  continues to be quite macerated around the right sided wound, despite applying barrier cream. This does, however, have the benefit of softening  the callus to make it more easily removable. 06/23/2021: Today, the left wound is smaller. The lateral aspect that had opened up previously is now closed. The wound base has Max healthy bed of granulation tissue and minimal slough. Unfortunately, on the right, the wound is larger and continues to be fairly macerated. He has also reopened the wound at his right ankle. He thinks this is due to the gait he has adopted secondary to his total contact cast and boot. 06/30/2021: T oday, both wounds are Max little bit larger. The lateral aspect on the left has remained closed. He continues to have significant periwound maceration. The culture that I took from the right sided wound grew Max population of bacteria that is not covered by his current [facility] antibiotic. The center of the right- sided wound continues to appear necrotic with nonviable fat. It probes deeper today, but does not reach bone. 07/07/2021: The periwound maceration is Max little bit less today. The right lateral foot wound has some areas that appear more viable and the necrotic center also looks Max little bit better. The wound on the dorsal surface of his right foot near the ankle is contracting and the surface appears healthy. The left plantar wound surface looks healthy, but there is some new undermining on the medial portion. He did get his new Keystone antibiotic and began applying that to the right foot wound on Saturday. 07/14/2021: The intake nurse reported substantial drainage from his wounds, but the periwound skin actually looks better than is typical for him. The wound on the dorsal surface of his right foot near the ankle is smaller and just has Max small open area underneath some dried eschar. The left plantar wound surface looks healthy and there has been no significant accumulation of callus. The  right lateral foot wound looks quite Max bit better, with the central portion, which typically appears necrotic, looking more viable albeit pale. 07/22/2021: His left foot is extremely macerated today. The wound is about the same size. The wound on the dorsal surface of his right foot near the ankle had closed, but he traumatized it removing the dressing and there is Max tiny skin tear in that location. The right lateral foot wound is bigger, but the surface appears healthy. 07/30/2021: The wound on the dorsal surface of his right foot near the ankle is closed. The right lateral foot wound again is Max little bit bigger due to some undermining. The periwound skin is in better condition, however. He has been applying zinc oxide. The wound surface is Max little bit dry today. On the left, he does not have the substantial maceration that we frequently see. The wound itself is smaller and has Max clean surface. 08/06/2021: Both wounds seem to have deteriorated over the past week. The right lateral foot wound has Max dry surface but the periwound is boggy.. Overall wound dimensions are about the same. On the left, the wound is about the same size, but there is more undermining present underneath periwound callus. 08/13/2021: The right sided wound looks about the same, but on the left there has been substantial deterioration. The undermining continues to extend under periwound callus. Once this was removed, substantial extension of the wound was present. There is no odor or purulent drainage but clearly the wounds have [name], [name] ([id number]) [phone].pdf Page 8 of 22 broken down. 08/20/2021: The wounds look about the same today. He has been out of his total contact cast and has just been changing the  dressings using topical Keystone with PolyMem Ag, Kerlix and Ace bandages. The wound on the top of his right ankle has reopened but this is quite small. There was Max little bit of  purulent material that I expressed when examining this wound. 08/24/2021: After the aggressive debridement I performed at his last visit, the wounds actually look Max little bit better today. They are smaller with the exception of the wound on the top of his right ankle which is Max little bit bigger as some more skin pulled off when he was changing his dressing. We are using topical Keystone with PolyMem Ag Kerlix and Ace bandages. 09/02/2021: There has been really no change to any of his wounds. 09/16/2021: The patient was hospitalized last week with nausea, vomiting, and dehydration. He says that while he was in the hospital, his wounds were not really addressed properly. T oday, both plantar foot wounds are larger and the periwound skin is macerated. The wound on the dorsum of his right foot has Max scab on the top. The right foot now has Max crater where previously he had had nonviable fat. It looks as though this simply died and fell out. The periwound callus is wet. 09/24/2021: His wounds have deteriorated somewhat since his last visit. The wound on the dorsum of his right foot near his ankle is larger and has more nonviable tissue present. The crater in his right foot is even deeper; I cannot quite palpate or probe to bone but I am sure it is close. The wound on his left plantar foot has an odd boggy area in the center that almost feels as though it has fluid within it. He has run out of his topical Keystone antibiotic. We are using silver alginate on his wounds. 09/29/2021: He has developed Max new wound on the dorsum of his left foot near his ankle. He says he thinks his wrapping is rubbing in that site. I would concur with this as the wound on his right ankle is larger. The left foot looks about the same. The right foot has the crater that was present last week. No significant slough accumulation, but his foot remains quite swollen and warm despite oral antibiotic therapy. 10/08/2021: All of his wounds look  about the same as last week. He did not start his oral antibiotics that are prescribed until just Max couple of days ago; his [employer] compounded antibiotics formula has been changed and he is awaiting delivery of the new recipe. His MRI that was scheduled for earlier this week was canceled as no prior authorization had been obtained; unfortunately the tech responsible sent an email to my old [employer] email, which I no longer use nor have access to. 10/18/2021: The wounds on his bilateral dorsal feet near the ankles are both improved. They are smaller and have just some eschar and slough buildup. The left plantar wound has Max fair amount of undermining, but the surface is clean. There is some periwound callus accumulation. On the right plantar foot, there is nonviable fat leading to Max deep tunnel that tracks towards his dorsal medial foot. There is periwound callus and slough accumulation, as well. His right foot and leg remain swollen as compared to the left. 10/25/2021: The wounds on his bilateral dorsal feet and at the ankles have broken down somewhat. They are little bit larger than last week. The left plantar wound continues to undermine laterally but the surface is clean. The right plantar foot wound shows some decreased depth  in the tunnel tracking towards his dorsal medial foot. He has not yet had the Doppler study that I ordered; it sounds like there is some confusion about the scheduling of the procedure. In addition, the MRI was denied and I have taken steps to appeal the denial. 11/24/2021: Since our last visit, [name] [name] was admitted to the hospital where an MRI suggested osteomyelitis. He was taken the operating room by podiatry. Bone biopsies were negative for osteomyelitis. They debrided his wounds and applied myriad matrix. He saw them last week and they removed his staples. He is here today to continue his wound healing process. T oday, both of the dorsal foot/ankle wounds are  substantially smaller. There is just Max little eschar overlying the left sided wound and some eschar and slough on the right. The right plantar foot ulcer has the healthiest surface of granulation tissue that I have seen to date. Max portion of the myriad matrix failed to take and was hanging loose. It appears that myriad morcells were placed into the tunnel closest to the dorsal portion of his foot. These have sloughed off. The left plantar foot ulcer is about the same size, but has Max much healthier surface than in the past. Both plantar ulcers have callus and slough accumulation. 12/07/2021: Left dorsal foot/ankle wound is closed. The right dorsal foot/ankle wound is nearly closed and just has Max small open area with some eschar and slough. The right plantar foot wound has contracted quite Max bit since our last visit. It has Max healthy surface with just Max little bit of slough accumulation and periwound callus buildup. The left plantar foot wound is about the same size but the surface appears healthy. There is Max little slough and periwound callus on this side, as well. 12/15/2021: Both dorsal foot/ankle wounds are closed. The right plantar foot wound is substantially smaller than at our last visit. The tunneling that was present has nearly closed. There is just Max little bit of slough buildup. The left plantar foot wound is also Max little bit smaller today. The surface is the healthiest that I have ever seen it. Light slough and periwound callus accumulation on this side. 12/22/2021: The dorsal ankle/foot wounds remain closed. The right plantar foot wound continues to contract. There is still Max bit of depth at the lateral portion of the wound but the surface has Max good granulated appearance. The wound on the left is about the same size, the central indented portion is still adherent. It also is clean with good granulation tissue present. The periwound skin and callus are Max bit macerated, however. 12/29/2021: Both  ankle wounds remain closed. The right plantar foot wound is less than half the size that it was last week. There is no depth any longer and the surface has just Max little bit of slough and periwound callus. On the left, the wound is not much smaller, but the surface is healthier with good granulation tissue. There is also Max little slough and periwound callus buildup. 01/05/2022: The right plantar foot wound continues to contract and is once again about half the size as it was last week. There is just Max little bit of surface slough and periwound callus. On the left, the depth of the wound has decreased and the diameter has started to contract. It is also very clean with just Max little slough and biofilm present. 01/12/2022: The right plantar foot wound is smaller again today. There is just some periwound callus and slough accumulation.  On the left, there is Max fair amount of undermining and the surface is Max little boggy, but no other significant change. 01/19/2022: The right plantar wound continues to contract. There is minimal periwound callus accumulation and just Max light layer of slough on the surface. On the left, the surface looks better, but there is fairly significant undermining near the 11:00 portion of the wound, aiming towards his great toe. The skin overlying this area is very healthy and has Max good fat layer present. No malodor or purulent drainage. 01/26/2022: The right sided wound continues to contract and has just Max light layer of slough on the surface. Minimal periwound callus. On the left, he still has substantial undermining and the tissue surface is not as robust as I would like to see. No malodor or purulent drainage, but he did say he had Max lot of serous drainage over the weekend. 02/02/2022: The right foot wound is about Max quarter of the size that it was at his last visit. There is just Max little slough on the surface and some periwound callus. On the left, the undermining persists  and the tissue is still more pale, but he does not seem to have had as much drainage over the last weekend. We are waiting on Max wound VAC. 02/09/2022: His right foot has healed. He received the wound VAC, but did not bring it to clinic with him today. The lateral aspect of the left foot wound has come in Max little, but he still has substantial undermining on the medial and distal portion of the wound. Still with macerated periwound callus. 02/16/2022: The wound VAC was applied last Friday. T oday, there has been tremendous improvement in the surface of the wound bed. The undermined portion of the wound has come in Max bit. There is still some overhanging dead skin. Overall the wound looks much better. 02/23/2022: No significant change in the overall wound dimensions, but the wound surface continues to improve and appears healthier and more robust. There is some periwound callus accumulation and overhanging skin. No concern for infection. [name], [name] ([id number]) [id number] Page 9 of 22 03/02/2022: Although the measurements taken in clinic today were unchanged, the visual appearance of the wound looks like it is smaller and the undermining/tunneling is filling with flesh. There is less periwound tissue maceration than usual. No malodor or purulent drainage. No concern for infection. 03/09/2022: The undermining has come in by couple of millimeters. The periwound is Max little bit more macerated this week. No concern for infection. 12/13; substantial wound on the left plantar foot. We are using silver collagen and Max wound VAC. 03/23/2022: The undermining continues to contract. The wound surface is Max little bit dry. 03/30/2022: The undermining has essentially resolved. There is still some depth at the center of the wound. The wound surface has good beefy tissue and the moisture balance is better. 04/06/2022: The wound dimensions are about the same, except that the depth is  beginning to fill in. He has had more drainage this week, but there is no obvious concern for infection. 04/13/2022: He has built up Max little bit of callus around the edges of the wound, creating some undermining. Otherwise the wound looks fairly unchanged. 04/20/2022: The wound bed tissue looks very healthy and robust. Light biofilm on the surface. There is some built up wet callus around the wound edges, but no undermining. 04/27/2022: The wound surface continues to look very healthy. Although the wound dimensions measured the same, it looks  smaller to me. No real callus accumulation this week. Minimal slough on the surface. 05/04/2022: The wound measured smaller and shallower this week. We are still awaiting insurance approval for Apligraf. 05/11/2022: The wound is about the same in terms of depth but measured Max little bit narrower today. He has built up Max rim of moist callus around the edges. He has been approved for Apligraf and we will apply that today. 05/18/2022: The wound measured Max little bit smaller again today. No significant callus buildup this week. 05/25/2022: The wound was measured about the same size today, but the multiple nooks and crannies in the depths of the wound seem to be filling in. 06/01/2022: The wound dimensions are smaller today, with the exception of the depth. There is Max little bit of moist callus around the edges. Electronic Signature(s) Signed: 06/01/2022 9:16:53 AM By: [name] Maudlin MD FACS Entered By: [name] [name] on 06/01/2022 09:16:53 -------------------------------------------------------------------------------- Physical Exam Details Patient Name: Date of Service: [name] [name], [name] 06/01/2022 8:15 Max M Medical Record Number: [id number] Patient Account Number: [id number] Date of Birth/Sex: Treating RN: November 04, 1985 (37 y.o. M) Primary Care Provider: Seward [name] Other Clinician: Referring Provider: Treating Provider/Extender: [name] [name] in Treatment: 135 Constitutional . . . . no acute distress. Respiratory Normal work of breathing on room air. Notes 06/01/2022: The wound dimensions are smaller today, with the exception of the depth. There is Max little bit of moist callus around the edges. Electronic Signature(s) Signed: 06/01/2022 9:18:08 AM By: [name] Maudlin MD FACS Entered By: [name] [name] on 06/01/2022 09:18:08 -------------------------------------------------------------------------------- Physician Orders Details Patient Name: Date of Service: [name] [name], [name] 06/01/2022 8:15 Max M Medical Record Number: [id number] Patient Account Number: [id number] Date of Birth/Sex: Treating RN: 06/28/1985 (37 y.o. 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 - [name] [name] - Room 1 with [name] Return Max Wednesday 3/6 @ 0815 am Cellular or Tissue Based Products Cellular or Tissue Based Product Type: - Apligraf #4 daptic or Mepitel. (DO NOT REMOVE). - may Cellular or Tissue Based Product applied to wound bed, secured with steri-strips, cover with Max change black sponge, leave steristrips in place Bathing/ Shower/ Hygiene May shower and wash wound with soap and water. Negative Presssure Wound Therapy Wound #3 Left,Lateral,Plantar Foot Wound Vac to wound continuously at 141m/hg pressure - decrease to 100 mm/hg pressure Black Foam Edema Control - Lymphedema / SCD / Other Bilateral Lower Extremities Avoid standing for long periods of  time. Exercise regularly Compression stocking or Garment 20-30 mm/Hg pressure to: - to both legs daily Off-Loading Open toe surgical shoe to: - Both feet Additional Orders / Instructions Follow Nutritious Diet Non Wound Condition pply the following to affected area as directed: - continue to apply foam donut or cushion to healed right plantar foot Max Wound Treatment Wound #3 - Foot Wound Laterality: Plantar, Left, Lateral Cleanser: Soap and Water 3 x Per Week/30 Days Discharge Instructions: May shower and wash wound with dial antibacterial soap and water prior to dressing change. Peri-Wound Care: Skin Prep (Generic) 3 x Per Week/30 Days Discharge Instructions: Use skin prep as directed Prim  Dressing: Apligraf ary 3 x Per Week/30 Days Prim Dressing: VAC ary 3 x Per Week/30 Days Secondary Dressing: ADAPTIC TOUCH 3x4.25 in 3 x Per Week/30 Days Discharge Instructions: Apply over primary dressing as directed. Secondary Dressing: Zetuvit Plus 4x8 in (Dispense As Written) 3 x Per Week/30 Days Discharge Instructions: Apply over primary dressing as directed. Secured With: Elastic Bandage 4 inch (ACE bandage) (Generic) 3 x Per Week/30 Days Discharge Instructions: Secure with ACE bandage as directed. Secured With: 79M Medipore Public affairs consultant Surgical T 2x10 (in/yd) (Generic) 3 x Per Week/30 Days ape Discharge Instructions: Secure with tape as directed. Compression Wrap: Kerlix Roll 4.5x3.1 (in/yd) (Generic) 3 x Per Week/30 Days Discharge Instructions: Apply Kerlix and Coban compression as directed. Electronic Signature(s) Signed: 06/01/2022 9:23:49 AM By: [name] Maudlin MD FACS Entered By: [name] [name] on 06/01/2022 09:18:21 -------------------------------------------------------------------------------- Problem List Details Patient Name: Date of Service: [name], [name] 06/01/2022 8:15 Max Tinnie Gens, [name] ([id number] Page 11 of 22 Medical Record  Number: [id number] Patient Account Number: [id number] Date of Birth/Sex: Treating RN: 04-23-1985 (37 y.o. M) Primary Care Provider: Seward [name] Other Clinician: Referring Provider: Treating Provider/Extender: [name] [name] in Treatment: 135 Active Problems ICD-10 Encounter Code Description Active Date MDM Diagnosis L97.528 Non-pressure chronic ulcer of other part of left foot with other specified 10/24/2019 No Yes severity E11.621 Type 2 diabetes mellitus with foot ulcer 10/24/2019 No Yes Inactive Problems ICD-10 Code Description Active Date Inactive Date L97.518 Non-pressure chronic ulcer of other part of right foot with other specified severity 07/14/2020 07/14/2020 L97.518 Non-pressure chronic ulcer of other part of right foot with other specified severity 10/24/2019 10/24/2019 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, right ankle and foot 10/24/2019 10/24/2019 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 08/10/2020 08/10/2020 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, left ankle and foot 10/24/2019 10/24/2019 L97.322 Non-pressure chronic ulcer of left ankle with fat layer exposed 09/29/2021 09/29/2021 B95.62 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 10/24/2019 10/24/2019 classified elsewhere Resolved Problems Electronic Signature(s) Signed: 06/01/2022 9:09:51 AM By: [name] Maudlin MD FACS Entered By: [name] [name] on 06/01/2022 09:09:51 -------------------------------------------------------------------------------- Progress Note Details Patient Name: Date of Service: [name] [name], [name] 06/01/2022 8:15 Max M Medical Record Number: [id number] Patient Account Number: [id number] Date of Birth/Sex: Treating RN: Jun 05, 1985 (37 y.o. M) Primary Care Provider: Seward [name] Other Clinician: Referring Provider: Treating Provider/Extender: [name] [name] in Treatment:  135 Subjective Chief Complaint Information obtained from Patient 01/11/2019; patient is here for review of Max rather substantial wound over the left fifth plantar metatarsal head extending into the lateral part of his foot 10/24/2019; patient returns to clinic with wounds on his bilateral feet with underlying osteomyelitis biopsy-proven Michelli, [name] ([id number]) [id number] Page 12 of 22 History of Present Illness (HPI) ADMISSION 01/11/2019 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 He comes in for review of Max wound over the plantar fifth metatarsal head extending into the lateral part of the foot. He was followed for this previously by his podiatrist Dr. Cornelius [name]. As the patient tells his story he went to see podiatry first for Max swelling he developed on the lateral part of his fifth metatarsal head in May. He states this was "open" by podiatry and the area closed. He was followed up in June and it was again opened callus removed and it closed promptly. There were plans being made for surgery on the fifth metatarsal  head in June however his blood sugar was apparently too high for anesthesia. Apparently the area was debrided and opened again in June and it is never closed since. Looking over the records from podiatry I am really not able to follow this. It was clear when he was first seen it was before 5/14 at that point he already had Max wound. By 5/17 the ulcer was resolved. I do not see anything about Max procedure. On 5/28 noted to have pre-ulcerative moderate keratosis. X-ray noted 1/5 contracted toe and tailor's bunion and metatarsal deformity. On Max visit date on 09/28/2018 the dorsal part of the left foot it healed and resolved. There was concern about swelling in his lower extremity he was sent to the ER.. As far as I can tell he was seen in the ER on 7/12 with an ulcer on his left foot. Max DVT rule out of the left leg was  negative. I do not think I have complete records from podiatry but I am not able to verify the procedures this patient states he had. He states after the last procedure the wound has never closed although I am not able to follow this in the records I have from podiatry. He has not had Max recent x-ray The patient has been using Neosporin on the wound. He is wearing Max Darco shoe. He is still very active up on his foot working and exercising. Past medical history; type 2 diabetes ketosis-prone, leg swelling with Max negative DVT study in July. Non-smoker ABI in our clinic was 0.85 on the left 10/16; substantial wound on the plantar left fifth met head extending laterally almost to the dorsal fifth MTP. We have been using silver alginate we gave him Max Darco forefoot off loader. An x-ray did not show evidence of osteomyelitis did note soft tissue emphysema which I think was due to gas tracking through an open wound. There is no doubt in my mind he requires an MRI 10/23; MRI not booked until 3 November at the earliest this is largely due to his glucose sensor in the right arm. We have been using silver alginate. There has been an improvement 10/29; I am still not exactly sure when his MRI is booked for. He says it is the third but it is the 10th in epic. This definitely needs to be done. He is running Max low-grade fever today but no other symptoms. No real improvement in the 1 02/26/2019 patient presents today for Max follow-up visit here in our clinic he is last been seen in the clinic on October 29. Subsequently we were working on getting MRI to evaluate and see what exactly was going on and where we would need to go from the standpoint of whether or not he had osteomyelitis and again what treatments were going be required. Subsequently the patient ended up being admitted to the hospital on 02/07/2019 and was discharged on 02/14/2019. This is Max somewhat interesting admission with Max discharge diagnosis of  pneumonia due to COVID-19 although he was positive for COVID-19 when tested at the urgent care but negative x2 when he was actually in the hospital. With that being said he did have acute respiratory failure with hypoxia and it was noted he also have Max left foot ulceration with osteomyelitis. With that being said he did require oxygen for his pneumonia and I level 4 L. He was placed on antivirals and steroids for the COVID-19. He was also transferred to the [facility] at  one point. Nonetheless he did subsequently discharged home and since being home has done much better in that regard. The CT angiogram did not show any pulmonary embolism. With regard to the osteomyelitis the patient was placed on vancomycin and Zosyn while in the hospital but has been changed to Augmentin at discharge. It was also recommended that he follow- up with wound care and podiatry. Podiatry however wanted him to see [name] according to the patient prior to them doing anything further. His hemoglobin A1c was 9.9 as noted in the hospital. Have an MRI of the left foot performed while in the hospital on 02/04/2019. This showed evidence of septic arthritis at the fifth MTP joint and osteomyelitis involving the fifth metatarsal head and proximal phalanx. There is an overlying plantar open wound noted an abscess tracking back along the lateral aspect of the fifth metatarsal shaft. There is otherwise diffuse cellulitis and mild fasciitis without findings of polymyositis. The patient did have recently pneumonia secondary to COVID-19 I looked in the chart through epic and it does appear that the patient may need to have an additional x-ray just to ensure everything is cleared and that he has no airspace disease prior to putting him into the [name]. 03/05/2019; patient was readmitted to the clinic last week. He was hospitalized twice for Max viral upper respiratory tract infection from 11/1 through 11/4 and then 11/5 through 11/12  ultimately this turned out to be Covid pneumonitis. Although he was discharged on oxygen he is not using it. He says he feels fine. He has no exercise limitation no cough no sputum. His O2 sat in our clinic today was 100% on room air. He did manage to have his MRI which showed septic arthritis at the fifth MTP joint and osteomyelitis involving the fifth metatarsal head and proximal phalanx. He received Vanco and Zosyn in the hospital and then was discharged on 2 weeks of Augmentin. I do not see any relevant cultures. He was supposed to follow-up with infectious disease but I do not see that he has an appointment. 12/8; patient saw Dr. Novella Olive of infectious disease last week. He felt that he had had adequate antibiotic therapy. He did not go to follow-up with Dr. Amalia Hailey of podiatry and I have again talked to him about the pros and cons of this. He does not want to consider Max ray amputation of this time. He is aware of the risks of recurrence, migration etc. He started HBO today and tolerated this well. He can complete the Augmentin that I gave him last week. I have looked over the lab work that Dr. Chana Bode ordered his C-reactive protein was 3.3 and his sedimentation rate was 17. The C-reactive protein is never really been measurably that high in this patient 12/15; not much change in the wound today however he has undermining along the lateral part of the foot again more extensively than last week. He has some rims of epithelialization. We have been using silver alginate. He is undergoing hyperbarics but did not dive today 12/18; in for his obligatory first total contact cast change. Unfortunately there was pus coming from the undermining area around his fifth metatarsal head. This was cultured but will preclude reapplication of Max cast. He is seen in conjunction with HBO 12/24; patient had staph lugdunensis in the wound in the undermining area laterally last time. We put him on doxycycline which should  have covered this. The wound looks better today. I am going to give him another week  of doxycycline before reattempting the total contact cast 12/31; the patient is completing antibiotics. Hemorrhagic debris in the distal part of the wound with some undermining distally. He also had hyper granulation. Extensive debridement with Max #5 curette. The infected area that was on the lateral part of the fifth met head is closed over. I do not think he needs any more antibiotics. Patient was seen prior to HBO. Preparations for Max total contact cast were made in the cast will be placed post hyperbarics 04/11/19; once again the patient arrives today without complaint. He had been in Max cast all week noted that he had heavy drainage this week. This resulted in large raised areas of macerated tissue around the wound 1/14; wound bed looks better slightly smaller. Hydrofera Blue has been changing himself. He had Max heavy drainage last week which caused Max lot of maceration around the wound so I took him out of Max total contact cast he says the drainage is actually better this week He is seen today in conjunction with HBO 1/21; returns to clinic. He was up in [place] for Max day or 2 attending Max funeral. He comes back in with the wound larger and with Max large area of exposed bone. He had osteomyelitis and septic arthritis of the fifth left metatarsal head while he was in hospital. He received IV antibiotics in the hospital for Max prolonged period of time then 3 weeks of Augmentin. Subsequently I gave him 2 weeks of doxycycline for more superficial wound infection. When I saw this last week the wound was smaller the surface of the wound looks satisfactory. 1/28; patient missed hyperbarics today. Bone biopsy I did last time showed Enterococcus faecalis and Staphylococcus lugdunensis . He has Max wide area of exposed bone. We are going to use silver alginate as of today. I had another ethical discussion with the patient. 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 In this situation I think the likelihood of healing this is low. Therefore I have recommended Max ray amputation and with the patient's agreement I have referred him to Dr. Doran Durand. The other issue is that his compliance with hyperbarics has been minimal because of his work schedule and given his underlying decision I am going to stop this today READMISSION 10/24/2019 MRI 09/29/2019 left foot [name], [name] ([id number]) [id number] Page 13 of 22 IMPRESSION: 1. Apparent skin ulceration inferior and lateral to the 5th metatarsal base with underlying heterogeneous T2 signal and enhancement in the subcutaneous fat. Small peripherally enhancing fluid collections along the plantar and lateral aspects of the 5th metatarsal base suspicious for abscesses. 2. Interval amputation through the mid 5th metatarsal with nonspecific low-level marrow edema and enhancement. Given the proximity to the adjacent soft tissue inflammatory changes, osteomyelitis cannot be excluded. 3. The additional bones appear unremarkable. MRI 09/29/2019 right foot IMPRESSION: 1. Soft tissue ulceration lateral to the 5th MTP joint. There is low-level T2 hyperintensity within the 4th and 5th metatarsal heads and adjacent proximal phalanges without abnormal T1 signal or cortical destruction. These findings are nonspecific and could be seen with early marrow edema, hyperemia or early osteomyelitis. No evidence of septic joint. 2. Mild tenosynovitis and synovial enhancement associated with the extensor digitorum tendons at the level of the midfoot. 3. Diffuse low-level muscular T2 hyperintensity and enhancement, most consistent with diabetic myopathy. LEFT FOOT BONE Methicillin resistant staphylococcus aureus Staphylococcus lugdunensis MIC MIC CIPROFLOXACIN >=8 RESISTANT Resistant <=0.5 SENSI... Sensitive CLINDAMYCIN <=0.25 SENS...  Sensitive >=8 RESISTANT  Resistant ERYTHROMYCIN >=8 RESISTANT Resistant >=8 RESISTANT Resistant GENTAMICIN <=0.5 SENSI... Sensitive <=0.5 SENSI... Sensitive Inducible Clindamycin NEGATIVE Sensitive NEGATIVE Sensitive OXACILLIN >=4 RESISTANT Resistant 2 SENSITIVE Sensitive RIFAMPIN <=0.5 SENSI... Sensitive <=0.5 SENSI... Sensitive TETRACYCLINE <=1 SENSITIVE Sensitive <=1 SENSITIVE Sensitive TRIMETH/SULFA <=10 SENSIT Sensitive <=10 SENSIT Sensitive ... Marland Kitchen.. VANCOMYCIN 1 SENSITIVE Sensitive <=0.5 SENSI... Sensitive Right foot bone . Component 3 wk ago Specimen Description BONE Special Requests RIGHT 4 METATARSAL SAMPLE B Gram Stain NO WBC SEEN NO ORGANISMS SEEN Culture RARE METHICILLIN RESISTANT STAPHYLOCOCCUS AUREUS NO ANAEROBES ISOLATED Performed at [facility], [place] [address], [place], [place] [address] Report Status 10/08/2019 FINAL Organism ID, Bacteria METHICILLIN RESISTANT STAPHYLOCOCCUS AUREUS Resulting Agency [facility] Susceptibility Methicillin resistant staphylococcus aureus MIC CIPROFLOXACIN >=8 RESISTANT Resistant CLINDAMYCIN <=0.25 SENS... Sensitive ERYTHROMYCIN >=8 RESISTANT Resistant GENTAMICIN <=0.5 SENSI... Sensitive Inducible Clindamycin NEGATIVE Sensitive OXACILLIN >=4 RESISTANT Resistant RIFAMPIN <=0.5 SENSI... Sensitive TETRACYCLINE <=1 SENSITIVE Sensitive TRIMETH/SULFA <=10 SENSIT Sensitive ... VANCOMYCIN 1 SENSITIVE Sensitive This is Max patient we had in clinic earlier this year with Max wound over his left fifth metatarsal head. 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 Doran Durand on 05/16/2019. He seems to have developed problems with wounds on his bilateral feet in June 2021  although he may have had problems earlier than this. He was in an urgent care with Max right foot ulcer on 09/26/2019 and given Max course of doxycycline. This was apparently after having trouble getting into see orthopedics. He was seen by podiatry on 09/28/2019 noted to have bilateral lower extremity ulcers including the left lateral fifth metatarsal base and the right subfifth met head. It was noted that had purulent drainage at that time. He required hospitalization from 6/20 through 7/2. This was because of worsening right foot wounds. He underwent bilateral operative incision and drainage and bone biopsies bilaterally. Culture results are listed above. He has been referred back to clinic by Dr. Jacqualyn Posey of podiatry. He is also followed by Dr. Megan Salon who saw him yesterday. He was discharged from hospital on Zyvox Flagyl and Levaquin and yesterday changed to doxycycline Flagyl and Levaquin. His inflammatory markers on 6/26 showed Max sedimentation rate of 129 and Max C-reactive protein of 5. This is improved to 14 and 1.3 respectively. This would indicate improvement. ABIs in our clinic today were 1.23 on the right and 1.20 on the left 11/01/2019 on evaluation today patient appears to be doing fairly well in regard to the wounds on his feet at this point. Fortunately there is no signs of active infection at this time. No fevers, chills, nausea, vomiting, or diarrhea. He currently is seeing infectious disease and still under their care at this point. Subsequently he also has both wounds which she has not been using collagen on as he did not receive that in his packaging he did not call us and let us know that. Apparently that just was missed on the order. Nonetheless we will get that straightened out today. 8/9-Patient returns for bilateral foot wounds, using Prisma with hydrogel moistened dressings, and the wounds appear stable. Patient using surgical shoes, avoiding much pressure or weightbearing as much as  possible 8/16; patient has bilateral foot wounds. 1 on the right lateral foot proximally the other is  on the left mid lateral foot. Both required debridement of callus and thick skin around the wounds. We have been using silver collagen [place]re, [name] ([id number]) [id number] Page 14 of 22 8/27; patient has bilateral lateral foot wounds. The area on the left substantially surrounded by callus and dry skin. This was removed from the wound edge. The underlying wound is small. The area on the right measured somewhat smaller today. 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 Unfortunately I did not update his antibiotics during today's visit. 9/10 I reviewed Dr. Hale Bogus last notes he felt he had completed antibiotics his inflammatory markers were reasonably well controlled. He has Max small wound on the lateral left foot and Max tiny area on the right which is just above closed. He is using Hydrofera Blue with border foam he has bilateral surgical shoes 9/24; 2 week f/u. doing well. right foot is closed. left foot still undermined. 10/14; right foot remains closed at the fifth met head. The area over the base of the left fifth metatarsal has Max small open area but considerable undermining towards the plantar foot. Thick callus skin around this suggests an adequate pressure relief. We have talked about this. He says he is going to go back into his cam boot. I suggested Max total contact cast he did not seem enamored with this suggestion 10/26; left foot base of the fifth metatarsal. Same condition as last time. He has skin over the area with an open wound however the skin is not adherent. He went to see Dr. Earleen Newport who did an x-ray and culture of his foot I have not reviewed the x-ray but the patient was not told anything. He is on doxycycline 11/11; since the patient was last here he was in the emergency room on  10/30 he was concerned about swelling in the left foot. They did not do any cultures or x-rays. They changed his antibiotics to cephalexin. Previous culture showed group B strep. The cephalexin is appropriate as doxycycline has less than predictable coverage. Arrives in clinic today with swelling over this area under the wound. He also has Max new wound on the right fifth metatarsal head 11/18; the patient has Max difficult wound on the lateral aspect of the left fifth metatarsal head. The wound was almost ballotable last week I opened it slightly expecting to see purulence however there was just bleeding. I cultured this this was negative. X-ray unchanged. We are trying to get an MRI but I am not sure were going to be able to get this through his insurance. He also has an area on the right lateral fifth metatarsal head this looks healthier 12/3; the patient finally got our MRI. Surprisingly this did not show osteomyelitis. I did show the soft tissue ulceration at the lateral plantar aspect of the fifth metatarsal base with Max tiny residual 6 mm abscess overlying the superficial fascia I have tried to culture this area I have not been able to get this to grow anything. Nevertheless the protruding tissue looks aggravated. I suspect we should try to treat the underlying "abscess with broad-spectrum antibiotics. I am going to start him on Levaquin and Flagyl. He has much less edema in his legs and I am going to continue to wrap his legs and see him weekly 12/10. I started Levaquin and Flagyl on him last week. He just picked up the Flagyl apparently there was some delay. The worry is the wound  on the left fifth metatarsal base which is substantial and worsening. His foot looks like he inverts at the ankle making this Max weightbearing surface. Certainly no improvement in fact I think the measurements of this are somewhat worse. We have been using 12/17; he apparently just got the Levaquin yesterday this is 2  weeks after the fact. He has completed the Flagyl. The area over the left fifth metatarsal base still has protruding granulation tissue although it does not look quite as bad as it did some weeks ago. He has severe bilateral lymphedema although we have not been treating him for wounds on his legs this is definitely going to require compression. There was so much edema in the left I did not wish to put him in Max total contact cast today. I am going to increase his compression from 3-4 layer. The area on the right lateral fifth met head actually look quite good and superficial. 12/23; patient arrived with callus on the right fifth met head and the substantial hyper granulated callused wound on the base of his fifth metatarsal. He says he is completing his Levaquin in 2 days but I do not think that adds up with what I gave him but I will have to double check this. We are using Hydrofera Blue on both areas. My plan is to put the left leg in Max cast the week after New Year's 04/06/2020; patient's wounds about the same. Right lateral fifth metatarsal head and left lateral foot over the base of the fifth metatarsal. There is undermining on the left lateral foot which I removed before application of total contact cast continuing with Hydrofera Blue new. Patient tells me he was seen by endocrinology today lab work was done [Dr. Kerr]. Also wondering whether he was referred to cardiology. I went over some lab work from previously does not have chronic renal failure certainly not nephrotic range proteinuria he does have very poorly controlled diabetes but this is not his most updated lab work. Hemoglobin A1c has been over 11 1/10; the patient had Max considerable amount of leakage towards mid part of his left foot with macerated skin however the wound surface looks better the area on the right lateral fifth met head is better as well. I am going to change the dressing on the left foot under the total contact cast to  silver alginate, continue with Hydrofera Blue on the right. 1/20; patient was in the total contact cast for 10 days. Considerable amount of drainage although the skin around the wound does not look too bad on the left foot. The area on the right fifth metatarsal head is closed. Our nursing staff reports large amount of drainage out of the left lateral foot wound 1/25; continues with copious amounts of drainage described by our intake staff. PCR culture I did last week showed E. coli and Enterococcus faecalis and low quantities. Multiple resistance genes documented including extended spectrum beta lactamase, MRSA, MRSE, quinolone, tetracycline. The wound is not quite as good this week as it was 5 days ago but about the same size 2/3; continues with copious amounts of malodorous drainage per our intake nurse. The PCR culture I did 2 weeks ago showed E. coli and low quantities of Enterococcus. There were multiple resistance genes detected. I put Neosporin on him last week although this does not seem to have helped. The wound is slightly deeper today. Offloading continues to be an issue here although with the amount of drainage she has Max  total contact cast is just not going to work 2/10; moderate amount of drainage. Patient reports he cannot get his stocking on over the dressing. I told him we have to do that the nurse gave him suggestions on how to make this work. The wound is on the bottom and lateral part of his left foot. Is cultured predominantly grew low amounts of Enterococcus, E. coli and anaerobes. There were multiple resistance genes detected including extended spectrum beta lactamase, quinolone, tetracycline. I could not think of an easy oral combination to address this so for now I am going to do topical antibiotics provided by [facility] I think the main agents here are vancomycin and an aminoglycoside. We have to be able to give him access to the wounds to get the topical antibiotic on 2/17;  moderate amount of drainage this is unchanged. He has his Keystone topical antibiotic against the deep tissue culture organisms. He has been using this and changing the dressing daily. Silver alginate on the wound surface. 2/24; using Keystone antibiotic with silver alginate on the top. He had too much drainage for Max total contact cast at one point although I think that is improving and I think in the next week or 2 it might be possible to replace Max total contact cast I did not do this today. In general the wound surface looks healthy however he continues to have thick rims of skin and subcutaneous tissue around the wide area of the circumference which I debrided 06/04/2020 upon evaluation today patient appears to be doing well in regard to his wound. I do feel like he is showing signs of improvement. There is little bit of callus and dead tissue around the edges of the wound as well as what appears to be Max little bit of Max sinus tract that is off to the side laterally I would perform debridement to clear that away today. 3/17; left lateral foot. The wound looks about the same as I remember. Not much depth surface looks healthy. No evidence of infection 3/25; left lateral foot. Wound surface looks about the same. Separating epithelium from the circumference. There really is no evidence of infection here however not making progress by my view 3/29; left lateral foot. Surface of the wound again looks reasonably healthy still thick skin and subcutaneous tissue around the wound margins. There is no evidence of infection. One of the concerns being brought up by the nurses has again the amount of drainage vis--vis continued use of Max total contact cast 4/5; left lateral foot at roughly the base of the fifth metatarsal. Nice healthy looking granulated tissue with rims of epithelialization. The overall wound measurements are not any better but the tissue looks healthy. The only concern is the amount of drainage  although he has no surrounding maceration with what we have been doing recently to absorb fluid and protect his skin. He also has lymphedema. 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 He states that the kerlix/coban under the TCC rolled down and could not get it back up. He has been using an offloading scooter and has somehow hurt his right foot using this device. This happened last week. He states that the side of his right foot developed Max  blister and opened. The top of his foot also has Max few small open wounds he thinks is due to his socks rubbing in his shoes. He has not been using any dressings to the wound. He denies purulent drainage, fever/chills or erythema to the wounds. 4/22; patient presents for 1 week follow-up. He developed new wounds to the right foot that were evaluated at last clinic visit. He continues to have Max total contact cast to the left leg and he reports no issues. He has been using silver collagen to the right foot wounds with no issues. He denies purulent drainage, fever/chills or erythema to the right foot wounds. He has no complaints today 4/25; patient presents for 1 week follow-up. He has Max total contact cast of the left leg and reports no issues. He has been using silver alginate to the right foot Bazzle, [name] ([id number]) [id number].pdf Page 15 of 22 wound. He denies purulent drainage, fever/chills or erythema to the right foot wounds. 5/2 patient presents for 1 week follow-up. T contact cast on the left. The wound which is on the base of the plantar foot at the base of the fifth metatarsal otal actually looks quite good and dimensions continue to gradually contract. HOWEVER the area on the  right lateral fifth metatarsal head is much larger than what I remember from 2 weeks ago. Once more is he has significant levels of hypergranulation. Noteworthy that he had this same hyper granulated response on his wound on the left foot at one point in time. So much so that he I thought there was an underlying fluid collection. Based on this I think this just needs debridement. 5/9; the wound on the left actually continues to be gradually smaller with Max healthy surface. Slight amount of drainage and maceration of the skin around but not too bad. However he has Max large wound over the right fifth metatarsal head very much in the same configuration as his left foot wound was initially. I used silver nitrate to address the hyper granulated tissue no mechanical debridement 5/16; area on the left foot did not look as healthy this week deeper thick surrounding macerated skin and subcutaneous tissue. oo The area on the right foot fifth met head was about the same oo The area on the right ankle that we identified last week is completely broken down into an open wound presumably Max stocking rubbing issue 5/23; patient has been using Max total contact cast to the left side. He has been using silver alginate underneath. He has also been using silver alginate to the right foot wounds. He has no complaints today. He denies any signs of infection. 5/31; the left-sided wound looks some better measure smaller surface granulation looks better. We have been using silver alginate under the total contact cast oo The large area on his right fifth met head and right dorsal foot look about the same still using silver alginate 6/6; neither side is good as I was hoping although the surface area dimensions are better. Max lot of maceration on his left and right foot around the wound edge. Area on the dorsal right foot looks better. He says he was traveling. I am not sure what does the amount of maceration around the plantar  wounds may be drainage issues 6/13; in general the wound surfaces look quite good on both sides. Macerated skin and raised edges around the wound required debridement although in general especially on the left the surface area seems improved. oo The area  on the right dorsal ankle is about the same I thought this would not be such Max problem to close 6/20; not much change in either wound although the one on the right looks Max little better. Both wounds have thick macerated edges to the skin requiring debridements. We have been using silver alginate. The area on the dorsal right ankle is still open I thought this would be closed. 6/28; patient comes in today with Max marked deterioration in the right foot wound fifth met head. Wide area of exposed bone this is Max drastic change from last time. The area on the left there we have been casting is stagnant. We have been using silver alginate in both wound areas. 7/5; bone culture I did for PCR last time was positive for Pseudomonas, group B strep, Enterococcus and Staph aureus. There was no suggestion of methicillin resistance or ampicillin resistant genes. This was resistant to tetracycline however He comes into the clinic today with the area over his right plantar fifth metatarsal head which had been doing so well 2 weeks ago completely necrotic feeling bone. I do not know that this is going to be salvageable. The left foot wound is certainly no smaller but it has Max better surface and is superficial. 7/8; patient called in this morning to say that his total contact cast was rubbing against his foot. He states he is doing fine overall. He denies signs of infection. 7/12; continued deterioration in the wound over the right fifth metatarsal head crumbling bone. This is not going to be salvageable. The patient agrees and wants to be referred to Dr. Doran Durand which we will attempt to arrange as soon as possible. I am going to continue him on antibiotics as long as  that takes so I will renew those today. The area on the left foot which is the base of the fifth metatarsal continues to look somewhat better. Healthy looking tissue no depth no debridement is necessary here. 7/20; the patient was kindly seen by Dr. Doran Durand of orthopedics on 10/19/2020. 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 Towards this end we have taken him out of the total contact cast on the left we will put him in Max wrap with Hydrofera Blue. As I understand things surgery is planned for 7/21 7/27; patient had his surgery last Thursday. 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 Apparently everything went well we did not still disturb that today The area on the left foot actually looks quite good. He has been much less mobile which probably explains this he did not seem to do well in the total contact cast secondary to drainage and maceration I think. 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 Fortunately there is no evidence of active infection at this time. No fevers, chills, nausea, vomiting, or diarrhea. Overall I think that he is actually doing extremely well. Nonetheless I do believe that he is staying off of this more following the surgery in his right foot that is the reason the left is doing so great. 8/16; left plantar foot wound. This looks smaller than the last time I saw this he is using Hydrofera Blue. The surgical wound on the right foot is being followed by Dr. Doran Durand we did not look at this  today. He has surgical shoes on both feet 8/23; left plantar foot wound not as good this week. Surrounding macerated skin and subcutaneous tissue everything looks moist and wet. I do not think he is offloading this adequately. He is using Max surgical shoe Apparently the right foot surgical  wound is not open although I did not check his foot 8/31; left plantar foot lateral aspect. Much improved this week. He has no maceration. Some improvement in the surface area of the wound but most impressively the depth is come in we are using silver alginate. The patient is Max Product/process development scientist. He is asked that we write him Max letter so he can go back to work. I have also tried to see if we can write something that will allow him to limit the amount of time that he is on his foot at work. 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 Hopefully the latter can be avoided 9/6; I believe the patient missed an appointment last week. He arrives in today with Max wound looking roughly the same certainly no better. Undermining laterally and also inferiorly. We used molecuLight today in training with the patient's permission.. We are using silver alginate 9/21 wound is measuring bigger this week although this may have to do with the aggressive circumferential debridement last week in response to the blush fluorescence on the MolecuLight. Culture I did last week showed significant MSSA and E. coli. I put him on Augmentin but he has not started it yet. We are also going to send this for compounded antibiotics at [facility]. There is no evidence of systemic infection 9/29; silver alginate. His Keystone arrived. He is completing Augmentin in 2 days. Offloading in Max cam boot. Moderate drainage per our intake staff 10/5; using silver alginate. He has been using his [place]. He has completed his Augmentin. Per our intake nurse still Max lot of drainage, far too much to consider Max total contact cast. Wound measures about the same. He had the same undermining area that I defined last week from Max roughly 11-3. I remove this today 10/12; using silver alginate he is using the [place]. He comes in for Max nurse visit hence we are applying [employer]  twice Max week. Measuring slightly better today and less notable drainage. Extensive debridement of the wound edge last time 10/18; using topical Keystone and silver alginate and Max soft cast. Wound measurements about the same. Drainage was through his soft cast. We are changing this twice Max week Tuesdays and Friday 10/25; comes in with moderate drainage. Still using Keystone silver alginate and Max soft cast. Wound dimensions completely the same.He has Max lot of edema in the left leg he has lymphedema. Asking for [name] to consider wrapping him as he cannot get his stocking on over the soft cast 11/2; comes in with moderate to large drainage slightly smaller in terms of width we have been using [place]. His wound looks satisfactory but not much improvement 11/4; patient presents today for obligatory cast change. Has no issues or complaints today. He denies signs of infection. 11/9; patient traveled this weekend to [place], was on the cast quite Max bit. Staining of the cast with black material from his walking boot. Drainage was not quite as bad as we feared. Using silver alginate and [place], [name] ([id number]) [id number] Page 16 of 22 11/16; we do not have size for cast therefore we  have been putting Max soft cast on him since the change on Friday. Still Max significant amount of drainage necessitating changing twice Max week. We have been using the Keystone at cast changes either hard or soft as well as silver alginate Comes in the clinic with things actually looking fairly good improvement in width. He says his offloading is about the same 02/24/2021 upon evaluation today patient actually comes back in and is doing excellent in regard to his foot ulcer this is significantly smaller even compared to the last visit. The soft cast seems to have done extremely well for him which is great news. I do not see any signs of infection minimal debridement will be needed today. 11/30; left  lateral foot much improved half Max centimeter improvement in surface area. No evidence of infection. He seems to be doing better with the soft cast in the TCC therefore we will continue with this. He comes back in later in the week for Max change with the nurses. This is due to drainage 12/6; no improvement in dimensions. Under illumination some debris on the surface we have been using silver alginate, soft cast. If there is anything optimistic here he seems to have have less drainage 12/13. Dimensions are improved both length and width and slightly in depth. Appears to be quite healthy today. Raised edges of this thick skin and callus around the edges however. He is in Max soft cast were bringing him back once for Max change on Friday. Drainage is better 12/20. Dimensions are improved. He still has raised edges of thick skin and callus around the edges. We are using Max soft cast 12/28; comes in today with thick callus around the wound. Using silver under alginate under Max soft cast. I do not think there is much improvement in any measurement 2023 04/06/2021; patient was put in Max total contact cast. Unfortunately not much change in surface area 1/10; not much different still thick callus and skin around the edge in spite of the total contact cast. This was just debrided last week we have been using the [facility] compounded antibiotic and silver alginate under Max total contact cast 1/18 the patient's wound on the left side is doing nicely. smaller HOWEVER he comes in today with Max wound on the right foot laterally. blister most likely serosangquenous drainage 1/24; the patient continues to do well in terms of the plantar left foot which is continued to contract using silver alginate under the total contact cast HOWEVER the right lateral foot is bigger with denuded skin around the edges. I used pickups and Max #15 scalpel to remove this this looks like the remanence of Max large blister. Cannot rule out infection.  Culture in this area I did last week showed Staphylococcus lugdunensis few colonies. I am going to try to address this with his [employer] antibiotic that is done so well on the left having linezolid and this should cover the staph 2/1; the patient's wound on his left foot which was the original plantar foot wound thick skin and eschar around the edges even in the total contact cast but the wound surface does not look too bad The real problem is on how his right lateral foot at roughly the base of the fifth metatarsal. The wound is completely necrotic more worrisome than that there is swelling around the edges of this. We have been using silver alginate on both wounds and Keystone on the right foot. Unfortunately I think he is going to require systemic  antibiotics while we await cultures. He did not get the x-ray done that we ordered last week [lost the prescription 2/7; disappointingly in the area on the left foot which we are treating with Max total contact cast is still not closed although it is much smaller. He continues to have Max lot of callus around the wound edge. -Right lateral foot culture I did last week was negative x-ray also negative for osteomyelitis. 2/15: TCC silver alginate on the left and silver alginate on the right lateral. No real improvement in either area 05/26/2021: T oday, the wounds are roughly the same size as at his previous visit, post-debridement. He continues to endorse fairly substantial drainage, particularly on the right. He has been in Max total contact cast on the left. There is still some callus surrounding this lesion. On the right, the periwound skin is quite macerated, along with surrounding callus. The center of the right-sided wound also has some dark, densely adherent material, which is very difficult to remove. 06/02/2021: Today, both wounds are slightly smaller. He has been using zinc oxide ointment around the right ulcer and the degree of maceration has  improved markedly. There continues to be an area of nonviable tissue in the center of the right sided ulcer. The left-sided wound, which has been in the total contact cast. Appears clean and the degree of callus around it is less than previously. 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 He had to take the stairs and both wounds have increased in size. The left foot, which has been in Max total contact cast, has developed Max tunnel tracking to the lateral aspect of his foot. The nonviable tissue in the center of the right-sided ulcer remains recalcitrant to debridement. There is significant undermining surrounding the entirety of the left sided wound. 06/16/2021: The elevator at school has been fixed and the patient has been able to avoid putting as much weight on his wounds over the past week. We converted the left foot wound into Max single lesion today, but despite this, the wound is actually smaller. The base is healthy with limited periwound callus. On the right, the central necrotic area is still present. He continues to be quite macerated around the right sided wound, despite applying barrier cream. This does, however, have the benefit of softening the callus to make it more easily removable. 06/23/2021: Today, the left wound is smaller. The lateral aspect that had opened up previously is now closed. The wound base has Max healthy bed of granulation tissue and minimal slough. Unfortunately, on the right, the wound is larger and continues to be fairly macerated. He has also reopened the wound at his right ankle. He thinks this is due to the gait he has adopted secondary to his total contact cast and boot. 06/30/2021: T oday, both wounds are Max little bit larger. The lateral aspect on the left has remained closed. He continues to have significant periwound maceration. The culture that I took from the right sided wound grew Max population of  bacteria that is not covered by his current [name]. [facility] antibiotic. The center of the right- sided wound continues to appear necrotic with nonviable fat. It probes deeper today, but does not reach bone. 07/07/2021: The periwound maceration is Max little bit less today. The right lateral foot wound has some areas that appear more viable and the necrotic center also looks Max little bit better. The wound on the dorsal surface  of his right foot near the ankle is contracting and the surface appears healthy. The left plantar wound surface looks healthy, but there is some new undermining on the medial portion. He did get his new Keystone antibiotic and began applying that to the right foot wound on Saturday. 07/14/2021: The intake nurse reported substantial drainage from his wounds, but the periwound skin actually looks better than is typical for him. The wound on the dorsal surface of his right foot near the ankle is smaller and just has Max small open area underneath some dried eschar. The left plantar wound surface looks healthy and there has been no significant accumulation of callus. The right lateral foot wound looks quite Max bit better, with the central portion, which typically appears necrotic, looking more viable albeit pale. 07/22/2021: His left foot is extremely macerated today. The wound is about the same size. The wound on the dorsal surface of his right foot near the ankle had closed, but he traumatized it removing the dressing and there is Max tiny skin tear in that location. The right lateral foot wound is bigger, but the surface appears healthy. 07/30/2021: The wound on the dorsal surface of his right foot near the ankle is closed. The right lateral foot wound again is Max little bit bigger due to some undermining. The periwound skin is in better condition, however. He has been applying zinc oxide. The wound surface is Max little bit dry today. On the left, he does not have the substantial maceration that we  frequently see. The wound itself is smaller and has Max clean surface. 08/06/2021: Both wounds seem to have deteriorated over the past week. The right lateral foot wound has Max dry surface but the periwound is boggy.. Overall wound dimensions are about the same. On the left, the wound is about the same size, but there is more undermining present underneath periwound callus. [name], [name] ([id number]) [id number] Page 17 of 22 08/13/2021: The right sided wound looks about the same, but on the left there has been substantial deterioration. The undermining continues to extend under periwound callus. Once this was removed, substantial extension of the wound was present. There is no odor or purulent drainage but clearly the wounds have broken down. 08/20/2021: The wounds look about the same today. He has been out of his total contact cast and has just been changing the dressings using topical Keystone with PolyMem Ag, Kerlix and Ace bandages. The wound on the top of his right ankle has reopened but this is quite small. There was Max little bit of purulent material that I expressed when examining this wound. 08/24/2021: After the aggressive debridement I performed at his last visit, the wounds actually look Max little bit better today. They are smaller with the exception of the wound on the top of his right ankle which is Max little bit bigger as some more skin pulled off when he was changing his dressing. We are using topical Keystone with PolyMem Ag Kerlix and Ace bandages. 09/02/2021: There has been really no change to any of his wounds. 09/16/2021: The patient was hospitalized last week with nausea, vomiting, and dehydration. He says that while he was in the hospital, his wounds were not really addressed properly. T oday, both plantar foot wounds are larger and the periwound skin is macerated. The wound on the dorsum of his right foot has Max scab on the top. The right foot now has Max crater  where previously he had had nonviable fat. It  looks as though this simply died and fell out. The periwound callus is wet. 09/24/2021: His wounds have deteriorated somewhat since his last visit. The wound on the dorsum of his right foot near his ankle is larger and has more nonviable tissue present. The crater in his right foot is even deeper; I cannot quite palpate or probe to bone but I am sure it is close. The wound on his left plantar foot has an odd boggy area in the center that almost feels as though it has fluid within it. He has run out of his topical Keystone antibiotic. We are using silver alginate on his wounds. 09/29/2021: He has developed Max new wound on the dorsum of his left foot near his ankle. He says he thinks his wrapping is rubbing in that site. I would concur with this as the wound on his right ankle is larger. The left foot looks about the same. The right foot has the crater that was present last week. No significant slough accumulation, but his foot remains quite swollen and warm despite oral antibiotic therapy. 10/08/2021: All of his wounds look about the same as last week. He did not start his oral antibiotics that are prescribed until just Max couple of days ago; his [employer] compounded antibiotics formula has been changed and he is awaiting delivery of the new recipe. His MRI that was scheduled for earlier this week was canceled as no prior authorization had been obtained; unfortunately the tech responsible sent an email to my old [place] email, which I no longer use nor have access to. 10/18/2021: The wounds on his bilateral dorsal feet near the ankles are both improved. They are smaller and have just some eschar and slough buildup. The left plantar wound has Max fair amount of undermining, but the surface is clean. There is some periwound callus accumulation. On the right plantar foot, there is nonviable fat leading to Max deep tunnel that tracks towards his dorsal medial foot.  There is periwound callus and slough accumulation, as well. His right foot and leg remain swollen as compared to the left. 10/25/2021: The wounds on his bilateral dorsal feet and at the ankles have broken down somewhat. They are little bit larger than last week. The left plantar wound continues to undermine laterally but the surface is clean. The right plantar foot wound shows some decreased depth in the tunnel tracking towards his dorsal medial foot. He has not yet had the Doppler study that I ordered; it sounds like there is some confusion about the scheduling of the procedure. In addition, the MRI was denied and I have taken steps to appeal the denial. 11/24/2021: Since our last visit, [name] [name] was admitted to the hospital where an MRI suggested osteomyelitis. He was taken the operating room by podiatry. Bone biopsies were negative for osteomyelitis. They debrided his wounds and applied myriad matrix. He saw them last week and they removed his staples. He is here today to continue his wound healing process. T oday, both of the dorsal foot/ankle wounds are substantially smaller. There is just Max little eschar overlying the left sided wound and some eschar and slough on the right. The right plantar foot ulcer has the healthiest surface of granulation tissue that I have seen to date. Max portion of the myriad matrix failed to take and was hanging loose. It appears that myriad morcells were placed into the tunnel closest to the dorsal portion of his foot. These have sloughed off. The left plantar  foot ulcer is about the same size, but has Max much healthier surface than in the past. Both plantar ulcers have callus and slough accumulation. 12/07/2021: Left dorsal foot/ankle wound is closed. The right dorsal foot/ankle wound is nearly closed and just has Max small open area with some eschar and slough. The right plantar foot wound has contracted quite Max bit since our last visit. It has Max healthy surface with  just Max little bit of slough accumulation and periwound callus buildup. The left plantar foot wound is about the same size but the surface appears healthy. There is Max little slough and periwound callus on this side, as well. 12/15/2021: Both dorsal foot/ankle wounds are closed. The right plantar foot wound is substantially smaller than at our last visit. The tunneling that was present has nearly closed. There is just Max little bit of slough buildup. The left plantar foot wound is also Max little bit smaller today. The surface is the healthiest that I have ever seen it. Light slough and periwound callus accumulation on this side. 12/22/2021: The dorsal ankle/foot wounds remain closed. The right plantar foot wound continues to contract. There is still Max bit of depth at the lateral portion of the wound but the surface has Max good granulated appearance. The wound on the left is about the same size, the central indented portion is still adherent. It also is clean with good granulation tissue present. The periwound skin and callus are Max bit macerated, however. 12/29/2021: Both ankle wounds remain closed. The right plantar foot wound is less than half the size that it was last week. There is no depth any longer and the surface has just Max little bit of slough and periwound callus. On the left, the wound is not much smaller, but the surface is healthier with good granulation tissue. There is also Max little slough and periwound callus buildup. 01/05/2022: The right plantar foot wound continues to contract and is once again about half the size as it was last week. There is just Max little bit of surface slough and periwound callus. On the left, the depth of the wound has decreased and the diameter has started to contract. It is also very clean with just Max little slough and biofilm present. 01/12/2022: The right plantar foot wound is smaller again today. There is just some periwound callus and slough accumulation. On the  left, there is Max fair amount of undermining and the surface is Max little boggy, but no other significant change. 01/19/2022: The right plantar wound continues to contract. There is minimal periwound callus accumulation and just Max light layer of slough on the surface. On the left, the surface looks better, but there is fairly significant undermining near the 11:00 portion of the wound, aiming towards his great toe. The skin overlying this area is very healthy and has Max good fat layer present. No malodor or purulent drainage. 01/26/2022: The right sided wound continues to contract and has just Max light layer of slough on the surface. Minimal periwound callus. On the left, he still has substantial undermining and the tissue surface is not as robust as I would like to see. No malodor or purulent drainage, but he did say he had Max lot of serous drainage over the weekend. 02/02/2022: The right foot wound is about Max quarter of the size that it was at his last visit. There is just Max little slough on the surface and some periwound callus. On the left, the undermining persists and  the tissue is still more pale, but he does not seem to have had as much drainage over the last weekend. We are waiting on Max wound VAC. 02/09/2022: His right foot has healed. He received the wound VAC, but did not bring it to clinic with him today. The lateral aspect of the left foot wound has come in Max little, but he still has substantial undermining on the medial and distal portion of the wound. Still with macerated periwound callus. 02/16/2022: The wound VAC was applied last Friday. T oday, there has been tremendous improvement in the surface of the wound bed. The undermined portion of the wound has come in Max bit. There is still some overhanging dead skin. Overall the wound looks much better. [name], [name] ([id number]) [id number] Page 18 of 22 02/23/2022: No significant change in the overall wound  dimensions, but the wound surface continues to improve and appears healthier and more robust. There is some periwound callus accumulation and overhanging skin. No concern for infection. 03/02/2022: Although the measurements taken in clinic today were unchanged, the visual appearance of the wound looks like it is smaller and the undermining/tunneling is filling with flesh. There is less periwound tissue maceration than usual. No malodor or purulent drainage. No concern for infection. 03/09/2022: The undermining has come in by couple of millimeters. The periwound is Max little bit more macerated this week. No concern for infection. 12/13; substantial wound on the left plantar foot. We are using silver collagen and Max wound VAC. 03/23/2022: The undermining continues to contract. The wound surface is Max little bit dry. 03/30/2022: The undermining has essentially resolved. There is still some depth at the center of the wound. The wound surface has good beefy tissue and the moisture balance is better. 04/06/2022: The wound dimensions are about the same, except that the depth is beginning to fill in. He has had more drainage this week, but there is no obvious concern for infection. 04/13/2022: He has built up Max little bit of callus around the edges of the wound, creating some undermining. Otherwise the wound looks fairly unchanged. 04/20/2022: The wound bed tissue looks very healthy and robust. Light biofilm on the surface. There is some built up wet callus around the wound edges, but no undermining. 04/27/2022: The wound surface continues to look very healthy. Although the wound dimensions measured the same, it looks smaller to me. No real callus accumulation this week. Minimal slough on the surface. 05/04/2022: The wound measured smaller and shallower this week. We are still awaiting insurance approval for Apligraf. 05/11/2022: The wound is about the same in terms of depth but measured Max little bit narrower today. He  has built up Max rim of moist callus around the edges. He has been approved for Apligraf and we will apply that today. 05/18/2022: The wound measured Max little bit smaller again today. No significant callus buildup this week. 05/25/2022: The wound was measured about the same size today, but the multiple nooks and crannies in the depths of the wound seem to be filling in. 06/01/2022: The wound dimensions are smaller today, with the exception of the depth. There is Max little bit of moist callus around the edges. Patient History Information obtained from Patient. Family History No family history of Cancer, Diabetes, Hereditary Spherocytosis, Hypertension, Kidney Disease, Lung Disease, Seizures, Stroke, Thyroid Problems, Tuberculosis. Social History Never smoker, Marital Status - Single, Alcohol Use - Rarely, Drug Use - No History, Caffeine Use - Never. Medical History Eyes Denies history of  Cataracts, Glaucoma, Optic Neuritis Ear/Nose/Mouth/Throat Denies history of Chronic sinus problems/congestion, Middle ear problems Hematologic/Lymphatic Denies history of Anemia, Hemophilia, Human Immunodeficiency Virus, Lymphedema, Sickle Cell Disease Respiratory Denies history of Aspiration, Asthma, Chronic Obstructive Pulmonary Disease (COPD), Pneumothorax, Sleep Apnea, Tuberculosis Cardiovascular Denies history of Angina, Arrhythmia, Congestive Heart Failure, Coronary Artery Disease, Deep Vein Thrombosis, Hypertension, Hypotension, Myocardial Infarction, Peripheral Arterial Disease, Peripheral Venous Disease, Phlebitis, Vasculitis Gastrointestinal Denies history of Cirrhosis , Colitis, Crohnoos, Hepatitis Max, Hepatitis B, Hepatitis C Endocrine Patient has history of Type II Diabetes Denies history of Type I Diabetes Immunological Denies history of Lupus Erythematosus, Raynaudoos, Scleroderma Integumentary (Skin) Denies history of History of Burn Musculoskeletal Denies history of Gout, Rheumatoid  Arthritis, Osteoarthritis, Osteomyelitis Neurologic Denies history of Dementia, Neuropathy, Quadriplegia, Paraplegia, Seizure Disorder Oncologic Denies history of Received Chemotherapy, Received Radiation Psychiatric Denies history of Anorexia/bulimia, Confinement Anxiety Hospitalization/Surgery History - 11/1-11/06/2018- sepsis foot infection. - 11/4-11/5 02 sats low respiratory distress. Objective [name], [name] ([id number]) [id number] Page 19 of 22 Constitutional no acute distress. Vitals Time Taken: 8:41 AM, Height: 77 in, Weight: 280 lbs, BMI: 33.2, Temperature: 97.5 F, Pulse: 85 bpm, Respiratory Rate: 18 breaths/min, Blood Pressure: 134/95 mmHg, Capillary Blood Glucose: 92 mg/dl. General Notes: glucose per pt report Respiratory Normal work of breathing on room air. General Notes: 06/01/2022: The wound dimensions are smaller today, with the exception of the depth. There is Max little bit of moist callus around the edges. Integumentary (Hair, Skin) Wound #3 status is Open. Original cause of wound was Trauma. The date acquired was: 10/02/2019. The wound has been in treatment 135 weeks. The wound is located on the [place]. The wound measures 2.6cm length x 2.9cm width x 1.8cm depth; 5.922cm^2 area and 10.659cm^3 volume. There is Fat Layer (Subcutaneous Tissue) exposed. There is no tunneling or undermining noted. There is Max medium amount of serosanguineous drainage noted. The wound margin is distinct with the outline attached to the wound base. There is large (67-100%) red, pale granulation within the wound bed. There is Max small (1- 33%) amount of necrotic tissue within the wound bed including Adherent Slough. The periwound skin appearance had no abnormalities noted for color. The periwound skin appearance exhibited: Callus, Maceration. Periwound temperature was noted as No Abnormality. 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 There was Max Excisional Skin/Subcutaneous Tissue Debridement with Max total area of 7.54 sq cm performed by [name] Maudlin, MD. With the following instrument(s): Curette to remove Viable and Non-Viable tissue/material. Material removed includes Callus, Subcutaneous Tissue, and Skin: Epidermis after achieving pain control using Lidocaine 4% Topical Solution. No specimens were taken. Max time out was conducted at 09:00, prior to the start of the procedure. Max Minimum amount of bleeding was controlled with Pressure. The procedure was tolerated well with Max pain level of 0 throughout and Max pain level of 0 following the procedure. Post Debridement Measurements: 2.6cm length x 2.9cm width x 1.8cm depth; 10.659cm^3 volume. Character of Wound/Ulcer Post Debridement is improved. Severity of Tissue Post Debridement is: Fat layer exposed. Post procedure Diagnosis Wound #3: Same as Pre-Procedure General Notes: Scribed for [name] [name] by [name] Gouty, RN. Pre-procedure diagnosis of Wound #3 is Max Diabetic Wound/Ulcer of the Lower Extremity located on  the Left,Lateral,Plantar Foot. Max skin graft procedure using Max bioengineered skin substitute/cellular or tissue based product was performed by [name] Maudlin, MD with the following instrument(s): Forceps and Scissors. Apligraf was applied and secured with Steri-Strips. 22 sq cm of product was utilized and 22 sq cm was wasted due to wound size. Post Application, adaptic, VAC was applied. Max Time Out was conducted at 09:00, prior to the start of the procedure. The procedure was tolerated well with Max pain level of 0 throughout and Max pain level of 0 following the  procedure. Post procedure Diagnosis Wound #3: Same as Pre-Procedure . Plan Follow-up Appointments: Return Appointment in 1 week. - [name] [name] - Room 1 with [facility] Wednesday 3/6 @ 0815 am Cellular or Tissue Based Products: Cellular or Tissue Based Product Type: - Apligraf #4 Cellular or Tissue Based Product applied to wound bed, secured with steri-strips, cover with Adaptic or Mepitel. (DO NOT REMOVE). - may change black sponge, leave steristrips in place Bathing/ Shower/ Hygiene: May shower and wash wound with soap and water. Negative Presssure Wound Therapy: Wound #3 Left,Lateral,Plantar Foot: Wound Vac to wound continuously at 131m/hg pressure - decrease to 100 mm/hg pressure Black Foam Edema Control - Lymphedema / SCD / Other: Avoid standing for long periods of time. Exercise regularly Compression stocking or Garment 20-30 mm/Hg pressure to: - to both legs daily Off-Loading: Open toe surgical shoe to: - Both feet Additional Orders / Instructions: Follow Nutritious Diet Non Wound Condition: Apply the following to affected area as directed: - continue to apply foam donut or cushion to healed right plantar foot Pink, CMali([id number] Page 20 of 22 WOUND #3: - Foot Wound Laterality: Plantar, Left, Lateral Cleanser: Soap and Water 3 x Per Week/30 Days Discharge Instructions: May shower and wash wound with dial antibacterial soap and water prior to dressing change. Peri-Wound Care: Skin Prep (Generic) 3 x Per Week/30 Days Discharge Instructions: Use skin prep as directed Prim Dressing: Apligraf 3 x Per Week/30 Days ary Prim Dressing: VAC 3 x Per Week/30 Days ary Secondary Dressing: ADAPTIC TOUCH 3x4.25 in 3 x Per Week/30 Days Discharge Instructions: Apply over primary dressing as directed. Secondary Dressing: Zetuvit Plus 4x8 in (Dispense As Written) 3 x Per Week/30 Days Discharge Instructions: Apply over primary dressing as  directed. Secured With: Elastic Bandage 4 inch (ACE bandage) (Generic) 3 x Per Week/30 Days Discharge Instructions: Secure with ACE bandage as directed. Secured With: 72M Medipore SPublic affairs consultantSurgical T 2x10 (in/yd) (Generic) 3 x Per Week/30 Days ape Discharge Instructions: Secure with tape as directed. Com pression Wrap: Kerlix Roll 4.5x3.1 (in/yd) (Generic) 3 x Per Week/30 Days Discharge Instructions: Apply Kerlix and Coban compression as directed. 06/01/2022: The wound dimensions are smaller today, with the exception of the depth. There is Max little bit of moist callus around the edges. I used Max curette to debride callus and moist skin from around the wound edges. There was some devitalized subcutaneous tissue that I also debrided. Apligraf was then prepared and fenestrated with Max scalpel. It was applied to the wound and secured with Adaptic and Steri-Strips. His wound VAC was then replaced by the nursing staff. He will follow-up in 1 week. Electronic Signature(s) Signed: 06/01/2022 9:21:30 AM By: CFredirick MaudlinMD FACS Entered By: CFredirick Maudlinon 06/01/2022 09:21:29 -------------------------------------------------------------------------------- HxROS Details Patient Name: Date of Service: ALewie [name] [name] 06/01/2022 8:15 Max M Medical Record Number: [id number] Account Number: [id number] of Birth/Sex: Treating RN: 620-May-1987(37y.o. M)  Primary Care Provider: Seward [name] Other Clinician: Referring Provider: Treating Provider/Extender: [name] [name] in Treatment: 61 Information Obtained From Patient Eyes Medical History: Negative for: Cataracts; Glaucoma; Optic Neuritis Ear/Nose/Mouth/Throat Medical History: Negative for: Chronic sinus problems/congestion; Middle ear problems Hematologic/Lymphatic Medical History: Negative for: Anemia; Hemophilia; Human Immunodeficiency Virus; Lymphedema; Sickle Cell Disease Respiratory Medical  History: Negative for: Aspiration; Asthma; Chronic Obstructive Pulmonary Disease (COPD); Pneumothorax; Sleep Apnea; Tuberculosis Cardiovascular Medical History: Negative for: Angina; Arrhythmia; Congestive Heart Failure; Coronary Artery Disease; Deep Vein Thrombosis; Hypertension; Hypotension; Myocardial Infarction; Peripheral Arterial Disease; Peripheral Venous Disease; Phlebitis; Vasculitis Gastrointestinal Medical History: Negative for: Cirrhosis ; Colitis; Crohns; Hepatitis Max; Hepatitis B; Hepatitis C Endocrine Salvo, [name] ([id number]) [id number] Page 21 of 22 Medical History: Positive for: Type II Diabetes Negative for: Type I Diabetes Time with diabetes: 8 Treated with: Insulin Blood sugar tested every day: No Immunological Medical History: Negative for: Lupus Erythematosus; Raynauds; Scleroderma Integumentary (Skin) Medical History: Negative for: History of Burn Musculoskeletal Medical History: Negative for: Gout; Rheumatoid Arthritis; Osteoarthritis; Osteomyelitis Neurologic Medical History: Negative for: Dementia; Neuropathy; Quadriplegia; Paraplegia; Seizure Disorder Oncologic Medical History: Negative for: Received Chemotherapy; Received Radiation Psychiatric Medical History: Negative for: Anorexia/bulimia; Confinement Anxiety Immunizations Pneumococcal Vaccine: Received Pneumococcal Vaccination: No Implantable Devices None Hospitalization / Surgery History Type of Hospitalization/Surgery 11/1-11/06/2018- sepsis foot infection 11/4-11/5 02 sats low respiratory distress Family and Social History Cancer: No; Diabetes: No; Hereditary Spherocytosis: No; Hypertension: No; Kidney Disease: No; Lung Disease: No; Seizures: No; Stroke: No; Thyroid Problems: No; Tuberculosis: No; Never smoker; Marital Status - Single; Alcohol Use: Rarely; Drug Use: No History; Caffeine Use: Never; Financial Concerns: No; Food, Clothing or Shelter Needs:  No; Support System Lacking: No; Transportation Concerns: No Electronic Signature(s) Signed: 06/01/2022 9:23:49 AM By: [name] Maudlin MD FACS Entered By: [name] [name] on 06/01/2022 09:17:01 -------------------------------------------------------------------------------- SuperBill Details Patient Name: Date of Service: [name] [name], [name] 06/01/2022 Medical Record Number: [id number] Patient Account Number: [id number] Date of Birth/Sex: Treating RN: 08-Feb-1986 (37 y.o. M) Primary Care Provider: Seward [name] Other Clinician: Referring Provider: Treating Provider/Extender: [name] [name] in Treatment: 135 Diagnosis Coding ICD-10 Codes Nesmith, [name] ([id number]) [id number] Page 22 of 22 Code Description L97.528 Non-pressure chronic ulcer of other part of left foot with other specified severity E11.621 Type 2 diabetes mellitus with foot ulcer Facility Procedures : CPT4 Code: [id number] Description: (Facility Use Only) Apligraf 1 SQ CM Modifier: Quantity: 44 : CPT4 Code: [id number] Description: [id number] - SKIN SUB GRAFT FACE/NK/HF/G ICD-10 Diagnosis Description L97.528 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 Modifier: ity Quantity: 1 Physician Procedures : CPT4 Code Description Modifier [id number] - WC PHYS LEVEL 4 - EST PT 25 ICD-10 Diagnosis Description L97.528 Non-pressure chronic ulcer of other part of left foot with other specified severity E11.621 Type 2 diabetes mellitus with foot ulcer Quantity: 1 : [id number] - WC PHYS SKIN SUB GRAFT FACE/NK/HF/G ICD-10 Diagnosis Description L97.528 Non-pressure chronic ulcer of other part of left foot with other specified severity Quantity: 1 Electronic Signature(s) Signed: 06/01/2022 9:21:49 AM By: [name] Maudlin MD FACS Entered By: [name] [name] on 06/01/2022 09:21:48

## 2022-06-02 NOTE — Progress Notes (Signed)
[name] [name] ([id number]) [id number] Page 1 of 6 Visit Report for 06/01/2022 Arrival Information Details Patient Name: Date of Service: [name] [name] Medical Record Number: [id number] Patient Account Number: [id number] Date of Birth/Sex: Treating RN: January 27, 1986 (37 y.o. [name] [name] Primary Care Velisa Regnier: [name] [name] Other Clinician: Referring Dorian Renfro: Treating Laiana Fratus/Extender: Osborn [name] in Treatment: 22 Visit Information History Since Last Visit Added or deleted any medications: No Patient Arrived: Ambulatory Any new allergies or adverse reactions: No Arrival Time: 08:40 Had Max fall or experienced change in No Accompanied By: fiance activities of daily living that may affect Transfer Assistance: None risk of falls: Patient Identification Verified: Yes Signs or symptoms of abuse/neglect since No Secondary Verification Process Completed: Yes last visito Patient Requires Transmission-Based Precautions: No Hospitalized since last visit: No Patient Has Alerts: No Implantable device outside of the clinic No excluding cellular tissue based products placed in the center since last visit: Has Dressing in Place as Prescribed: Yes Has Footwear/Offloading in Place as Yes Prescribed: Left: Surgical Shoe with Pressure Relief Insole Pain Present Now: No Electronic Signature(s) Signed: 06/01/2022 4:05:52 PM By: Baruch Gouty RN, BSN Entered By: Baruch Gouty on 06/01/2022 08:41:01 -------------------------------------------------------------------------------- Encounter Discharge Information Details Patient Name: Date of Service: [name] [name], [name] [name] 06/01/2022 8:15 [name] [name] Medical Record Number: [id number] Patient Account Number: [id number] Date of Birth/Sex: Treating RN: 1985/06/28 (37 y.o. [name] [name] Primary Care Roxann Vierra: [name] [name] Other Clinician: Referring Titan Karner: Treating  Cassian Torelli/Extender: Osborn [name] in Treatment: 135 Encounter Discharge Information Items Post Procedure Vitals Discharge Condition: Stable Temperature (F): 97.5 Ambulatory Status: Ambulatory Pulse (bpm): 85 Discharge Destination: Home Respiratory Rate (breaths/min): 18 Transportation: Private Auto Blood Pressure (mmHg): 134/95 Accompanied By: fiance Schedule Follow-up Appointment: Yes Clinical Summary of Care: Patient Declined Electronic Signature(s) Signed: 06/01/2022 4:05:52 PM By: Baruch Gouty RN, BSN Entered By: Baruch Gouty on 06/01/2022 09:27:01 -------------------------------------------------------------------------------- Lower Extremity Assessment Details Patient Name: Date of Service: [name] [name], [name] [name] 06/01/2022 8:15 [name] [name] Medical Record Number: [id number] Patient Account Number: [id number] Date of Birth/Sex: Treating RN: 1986/02/22 (37 y.o. [name] [name] Primary Care Rulon Abdalla: [name] [name] Other Clinician: Referring Clarinda Obi: Treating Jeoffrey Eleazer/Extender: Wilfrid Lund [place], [name] ([id number]) [id number] Page 2 of 6 Weeks in Treatment: 135 Edema Assessment Assessed: [Left: No] [Right: No] Edema: [Left: Ye] [Right: s] Calf Left: Right: Point of Measurement: 48 cm From Medial Instep 48.5 cm Ankle Left: Right: Point of Measurement: 11 cm From Medial Instep 30 cm Vascular Assessment Pulses: Dorsalis Pedis Palpable: [Left:Yes] Electronic Signature(s) Signed: 06/01/2022 4:05:52 PM By: Baruch Gouty RN, BSN Entered By: Baruch Gouty on 06/01/2022 08:50:37 -------------------------------------------------------------------------------- Multi Wound Chart Details Patient Name: Date of Service: [name] [name], [name] [name] 06/01/2022 8:15 [name] [name] Medical Record Number: [id number] Patient Account Number: [id number] Date of Birth/Sex: Treating RN: 1986/01/07 (37 y.o. [name]) Primary Care Chalet Kerwin: [name] [name] Other Clinician: Referring Verbon Giangregorio: Treating Lurlie Wigen/Extender: Osborn [name] in Treatment: 135 Vital Signs Height(in): 77 Capillary Blood Glucose(mg/dl): 92 Weight(lbs): 280 Pulse(bpm): 85 Body Mass Index(BMI): 33.2 Blood Pressure(mmHg): 134/95 Temperature(F): 97.5 Respiratory Rate(breaths/min): 18 [3:Photos: No Photos Left, Lateral, Plantar Foot Wound Location: Trauma Wounding Event: Diabetic Wound/Ulcer of the Lower Primary Etiology: Extremity Type II Diabetes Comorbid History: 10/02/2019 Date Acquired: 135 Weeks of Treatment: Open Wound Status: No Wound  Recurrence: 2.6x2.9x1.8 Measurements L x W x [name] 5.922 Max (cm) : rea 10.659 Volume (cm) : -259.10% % Reduction in  Max rea: -6360.00% % Reduction in Volume: Grade 2 Classification: Medium Exudate Max mount: Serosanguineous Exudate Type: red, brown Exudate  Color: Distinct, outline attached Wound Margin: Large (67-100%) Granulation Max mount: Red, Pale Granulation Quality: Small (1-33%) Necrotic Max mount: Fat Layer (Subcutaneous Tissue): Yes N/Max Exposed Structures: Fascia: No Tendon: No Muscle: No Joint: No  Bone: No] [N/Max:N/Max N/Max N/Max N/Max N/Max N/Max N/Max N/Max N/Max N/Max N/Max N/Max N/Max N/Max N/Max N/Max N/Max N/Max N/Max N/Max N/Max N/Max] Sturgeon, [name] ([id number]) [3:Small (1-33%) Epithelialization: Debridement - Excisional Debridement: 09:00 Pre-procedure Verification/Time Out Taken: Lidocaine 4% Topical Solution Pain Control: Callus, Subcutaneous Tissue Debrided: Skin/Subcutaneous Tissue Level: 7.54 Debridement  Max (sq cm): rea Curette Instrument: Minimum Bleeding: Pressure Hemostasis Max chieved: 0 Procedural Pain: 0 Post Procedural Pain: Procedure was tolerated well Debridement Treatment Response: 2.6x2.9x1.8 Post Debridement Measurements L x W x [name] 10.659  Post Debridement Volume: (cm) Callus: Yes Periwound Skin Texture: Maceration: Yes Periwound Skin Moisture: No Abnormalities Noted Periwound Skin Color: No Abnormality Temperature:  Cellular or Tissue Based Product Procedures Performed: Debridement]  [N/Max:N/Max N/Max N/Max N/Max N/Max N/Max N/Max N/Max N/Max N/Max N/Max N/Max N/Max N/Max N/Max N/Max N/Max N/Max N/Max N/Max] Treatment Notes Electronic Signature(s) Signed: 06/01/2022 9:11:40 AM By: [name] MD FACS Entered By: [name] on 06/01/2022 09:11:40 -------------------------------------------------------------------------------- Multi-Disciplinary Care Plan Details Patient Name: Date of Service: [name] [name], [name] [name] 06/01/2022 8:15 [name] [name] Medical Record Number: [id number] Patient Account Number: [id number] Date of Birth/Sex: Treating RN: 1985-09-09 (37 y.o. [name] [name] Primary Care Danis Pembleton: [name] [name] Other Clinician: Referring Dave Mergen: Treating Abella Shugart/Extender: Osborn [name] in Treatment: Flora reviewed with physician Active Inactive Nutrition Nursing Diagnoses: Imbalanced nutrition Potential for alteratiion in Nutrition/Potential for imbalanced nutrition Goals: Patient/caregiver agrees to and verbalizes understanding of need to use nutritional supplements and/or vitamins as prescribed Date Initiated: 10/24/2019 Date Inactivated: 04/06/2020 Target Resolution Date: 04/03/2020 Goal Status: Met Patient/caregiver will maintain therapeutic glucose control Date Initiated: 10/24/2019 Target Resolution Date: 06/29/2022 Goal Status: Active Interventions: Assess HgA1c results as ordered upon admission and as needed Assess patient nutrition upon admission and as needed per policy Provide education on elevated blood sugars and impact on wound healing Provide education on nutrition Treatment Activities: Education provided on Nutrition : 02/02/2022 Notes: 11/17/20: Glucose control ongoing issue, target date extended. 01/26/21: Glucose management continues. Wound/Skin Impairment Nursing Diagnoses: Impaired tissue integrity [name] [name], [name] ([id number])  [id number] Page 4 of 6 Knowledge deficit related to ulceration/compromised skin integrity Goals: Patient/caregiver will verbalize understanding of skin care regimen Date Initiated: 10/24/2019 Target Resolution Date: 06/29/2022 Goal Status: Active Ulcer/skin breakdown will have Max volume reduction of 30% by week 4 Date Initiated: 10/24/2019 Date Inactivated: 01/16/2020 Target Resolution Date: 01/10/2020 Unmet Reason: no change in Goal Status: Unmet measurements. Interventions: Assess patient/caregiver ability to obtain necessary supplies Assess patient/caregiver ability to perform ulcer/skin care regimen upon admission and as needed Assess ulceration(s) every visit Provide education on ulcer and skin care Notes: 11/17/20: Wound care regimen continues Electronic Signature(s) Signed: 06/01/2022 4:05:52 PM By: Baruch Gouty RN, BSN Entered By: Baruch Gouty on 06/01/2022 09:09:31 -------------------------------------------------------------------------------- Pain Assessment Details Patient Name: Date of Service: [name] [name], [name] [name] 06/01/2022 8:15 [name] [name] Medical Record Number: [id number] Patient Account Number: [id number] Date of Birth/Sex: Treating RN: 1986-03-03 (37 y.o. [name] [name] Primary Care Yui Mulvaney: [name] [name] Other Clinician: Referring Krislyn Donnan: Treating Rita Prom/Extender: Osborn [name] in Treatment: 135 Active Problems Location of Pain Severity and Description of Pain  Patient Has Paino No Site Locations Pain Management and Medication Current Pain Management: Electronic Signature(s) Signed: 06/01/2022 4:05:52 PM By: Baruch Gouty RN, BSN Entered By: Baruch Gouty on 06/01/2022 08:41:52 -------------------------------------------------------------------------------- Patient/Caregiver Education Details Patient Name: Date of Service: [name] [name], [name] [name] 2/28/2024andnbsp8:15 [name] [name] Medical Record Number:  [id number] Patient Account Number: [id number] [name] [name], [name] ([id number]) [id number] Page 5 of 6 Date of Birth/Gender: Treating RN: [birth year] (37 y.o. [name] [name] Primary Care Physician: [name] [name] Other Clinician: Referring Physician: Treating Physician/Extender: Osborn [name] in Treatment: 135 Education Assessment Education Provided To: Patient Education Topics Provided Offloading: Methods: Explain/Verbal Responses: Reinforcements needed, State content correctly Wound/Skin Impairment: Methods: Explain/Verbal Responses: Reinforcements needed, State content correctly Electronic Signature(s) Signed: 06/01/2022 4:05:52 PM By: Baruch Gouty RN, BSN Entered By: Baruch Gouty on 06/01/2022 09:09:56 -------------------------------------------------------------------------------- Wound Assessment Details Patient Name: Date of Service: [name] [name], [name] [name] 06/01/2022 8:15 [name] [name] Medical Record Number: [id number] Patient Account Number: [id number] Date of Birth/Sex: Treating RN: 1985-06-13 (37 y.o. [name] [name] Primary Care Clarita Mcelvain: [name] [name] Other Clinician: Referring Lurine Imel: Treating Jaz Mallick/Extender: Osborn [name] in Treatment: 135 Wound Status Wound Number: 3 Primary Etiology: Diabetic Wound/Ulcer of the Lower Extremity Wound Location: Left, Lateral, Plantar Foot Wound Status: Open Wounding Event: Trauma Comorbid History: Type II Diabetes Date Acquired: 10/02/2019 Weeks Of Treatment: 135 Clustered Wound: No Wound Measurements Length: (cm) 2.6 Width: (cm) 2.9 Depth: (cm) 1.8 Area: (cm) 5.922 Volume: (cm) 10.659 % Reduction in Area: -259.1% % Reduction in Volume: -6360% Epithelialization: Small (1-33%) Tunneling: No Undermining: No Wound Description Classification: Grade 2 Wound Margin: Distinct, outline attached Exudate Amount: Medium Exudate Type:  Serosanguineous Exudate Color: red, brown Foul Odor After Cleansing: No Slough/Fibrino Yes Wound Bed Granulation Amount: Large (67-100%) Exposed Structure Granulation Quality: Red, Pale Fascia Exposed: No Necrotic Amount: Small (1-33%) Fat Layer (Subcutaneous Tissue) Exposed: Yes Necrotic Quality: Adherent Slough Tendon Exposed: No Muscle Exposed: No Joint Exposed: No Bone Exposed: No Periwound Skin Texture Texture Color No Abnormalities Noted: No No Abnormalities Noted: Yes Callus: Yes Temperature / Pain [name] [name], [name] ([id number].pdf Page 6 of 6 Temperature: No Abnormality Moisture No Abnormalities Noted: No Maceration: Yes Treatment Notes Wound #3 (Foot) Wound Laterality: Plantar, Left, Lateral Cleanser Soap and Water Discharge Instruction: May shower and wash wound with dial antibacterial soap and water prior to dressing change. Peri-Wound Care Skin Prep Discharge Instruction: Use skin prep as directed Topical Primary Dressing Apligraf VAC Secondary Dressing ADAPTIC TOUCH 3x4.25 in Discharge Instruction: Apply over primary dressing as directed. Zetuvit Plus 4x8 in Discharge Instruction: Apply over primary dressing as directed. Secured With Elastic Bandage 4 inch (ACE bandage) Discharge Instruction: Secure with ACE bandage as directed. 41M Medipore Soft Cloth Surgical T 2x10 (in/yd) ape Discharge Instruction: Secure with tape as directed. Compression Wrap Kerlix Roll 4.5x3.1 (in/yd) Discharge Instruction: Apply Kerlix and Coban compression as directed. Compression Stockings Add-Ons Electronic Signature(s) Signed: 06/01/2022 4:05:52 PM By: Baruch Gouty RN, BSN Entered By: Baruch Gouty on 06/01/2022 08:52:31 -------------------------------------------------------------------------------- Vitals Details Patient Name: Date of Service: [name] [name], [name] [name] 06/01/2022 8:15 [name] [name] Medical Record Number: [id number] Patient  Account Number: [id number] Date of Birth/Sex: Treating RN: 1986/03/17 (37 y.o. [name] [name] Primary Care Donya Tomaro: [name] [name] Other Clinician: Referring Karman Veney: Treating Lazar Tierce/Extender: Osborn [name] in Treatment: 135 Vital Signs Time Taken: 08:41 Temperature (F): 97.5 Height (in): 77 Pulse (bpm): 85 Weight (lbs): 280 Respiratory Rate (breaths/min): 18 Body Mass Index (BMI): 33.2 Blood  Pressure (mmHg): 134/95 Capillary Blood Glucose (mg/dl): 92 Reference Range: 80 - 120 mg / dl Notes glucose per pt report Electronic Signature(s) Signed: 06/01/2022 4:05:52 PM By: Baruch Gouty RN, BSN Entered By: Baruch Gouty on 06/01/2022 08:43:09

## 2022-06-08 ENCOUNTER — Encounter (HOSPITAL_BASED_OUTPATIENT_CLINIC_OR_DEPARTMENT_OTHER): Payer: BC Managed Care – PPO | Attending: General Surgery | Admitting: General Surgery

## 2022-06-08 DIAGNOSIS — E11621 Type 2 diabetes mellitus with foot ulcer: Secondary | ICD-10-CM | POA: Insufficient documentation

## 2022-06-08 DIAGNOSIS — L97522 Non-pressure chronic ulcer of other part of left foot with fat layer exposed: Secondary | ICD-10-CM | POA: Insufficient documentation

## 2022-06-10 NOTE — Progress Notes (Signed)
[name], [name] ([id number]) [id number] Page 1 of 7 Visit Report for 06/08/2022 Arrival Information Details Patient Name: Date of Service: [name] 06/08/2022 8:15 A M Medical Record Number: [id number] Patient Account Number: [id number] Date of Birth/Sex: Treating RN: Dec 23, 1985 (37 y.o. [name] Primary Care [name]: [name] Other Clinician: Referring [name]: Treating [name]: [name] in Treatment: 77 Visit Information History Since Last Visit Added or deleted any medications: No Patient Arrived: Ambulatory Any new allergies or adverse reactions: No Arrival Time: 08:12 Had a fall or experienced change in No Accompanied By: self activities of daily living that may affect Transfer Assistance: None risk of falls: Patient Requires Transmission-Based Precautions: No Signs or symptoms of abuse/neglect since last visito No Patient Has Alerts: No Hospitalized since last visit: No Implantable device outside of the clinic excluding No cellular tissue based products placed in the center since last visit: Has Dressing in Place as Prescribed: Yes Pain Present Now: No Electronic Signature(s) Signed: 06/08/2022 4:54:22 PM By: Blanche East RN Entered By: Blanche East on 06/08/2022 08:14:05 -------------------------------------------------------------------------------- Encounter Discharge Information Details Patient Name: Date of Service: [name], [name] 06/08/2022 8:15 A M Medical Record Number: [id number] Patient Account Number: [id number] Date of Birth/Sex: Treating RN: [birth year] (37 y.o. [name] Primary Care Kenon Delashmit: [name] Other Clinician: Referring [name]: Treating Shahin Knierim/Extender: [name] in Treatment: [phone] Encounter Discharge Information Items Post Procedure Vitals Discharge Condition: Stable Temperature (F): 98.1 Ambulatory Status:  Walker Pulse (bpm): 99 Discharge Destination: Home Respiratory Rate (breaths/min): 18 Transportation: Private Auto Blood Pressure (mmHg): 121/72 Accompanied By: son Schedule Follow-up Appointment: Yes Clinical Summary of Care: Electronic Signature(s) Signed: 06/08/2022 9:10:04 AM By: Blanche East RN Entered By: Blanche East on 06/08/2022 09:10:04 -------------------------------------------------------------------------------- Lower Extremity Assessment Details Patient Name: Date of Service: [name] 06/08/2022 8:15 A M Medical Record Number: [id number] Patient Account Number: [id number] Date of Birth/Sex: Treating RN: 1985-12-26 (37 y.o. [name] Primary Care Nikolaj Geraghty: [name] Other Clinician: Referring Trenise Turay: Treating Colisha Redler/Extender: [name] in Treatment: 136 Edema Assessment Assessed: [name]: No] [Right: No] A[Left: RMSTRONG, [name] ([id number] [[id number] Page 2 of 7] Edema: [Left: Ye] [Right: s] Calf Left: Right: Point of Measurement: 48 cm From Medial Instep 48 cm Ankle Left: Right: Point of Measurement: 11 cm From Medial Instep 29.4 cm Vascular Assessment Pulses: Dorsalis Pedis Palpable: [Left:Yes] Electronic Signature(s) Signed: 06/08/2022 4:54:22 PM By: Blanche East RN Entered By: Blanche East on 06/08/2022 08:17:32 -------------------------------------------------------------------------------- Multi Wound Chart Details Patient Name: Date of Service: [name], [name] 06/08/2022 8:15 A M Medical Record Number: [id number] Patient Account Number: [id number] Date of Birth/Sex: Treating RN: [birth year] (37 y.o. M) Primary Care [name]: [name] Other Clinician: Referring Caylin Raby: Treating Deverick Pruss/Extender: [name] in Treatment: 136 Vital Signs Height(in): 77 Pulse(bpm): 99 Weight(lbs): 280 Blood Pressure(mmHg): 121/72 Body Mass  Index(BMI): 33.2 Temperature(F): 98.1 Respiratory Rate(breaths/min): 18 [3:Photos:] [N/A:N/A] Left, Lateral, Plantar Foot N/A N/A Wound Location: Trauma N/A N/A Wounding Event: Diabetic Wound/Ulcer of the Lower N/A N/A Primary Etiology: Extremity Type II Diabetes N/A N/A Comorbid History: 10/02/2019 N/A N/A Date Acquired: 136 N/A N/A Weeks of Treatment: Open N/A N/A Wound Status: No N/A N/A Wound Recurrence: 2.5x2.4x1.8 N/A N/A Measurements L x W x [name] 4.712 N/A N/A A (cm) : rea 8.482 N/A N/A Volume (cm) : -185.70% N/A N/A % Reduction in A rea: -5040.60% N/A N/A % Reduction in  Volume: Grade 2 N/A N/A Classification: Medium N/A N/A Exudate A mount: Serosanguineous N/A N/A Exudate Type: red, brown N/A N/A Exudate Color: Distinct, outline attached N/A N/A Wound Margin: Large (67-100%) N/A N/A Granulation A mount: Red, Pale N/A N/A Granulation Quality: Small (1-33%) N/A N/A Necrotic A mount: Fat Layer (Subcutaneous Tissue): Yes N/A N/A Exposed Structures: Fascia: No Tendon: No [name], [name] ([id number] Page 3 of 7 Muscle: No Joint: No Bone: No Small (1-33%) N/A N/A Epithelialization: Debridement - Excisional N/A N/A Debridement: Pre-procedure Verification/Time Out 08:50 N/A N/A Taken: Lidocaine 4% Topical Solution N/A N/A Pain Control: Subcutaneous N/A N/A Tissue Debrided: Skin/Subcutaneous Tissue N/A N/A Level: 6 N/A N/A Debridement A (sq cm): rea Curette N/A N/A Instrument: Minimum N/A N/A Bleeding: Pressure N/A N/A Hemostasis A chieved: 0 N/A N/A Procedural Pain: 0 N/A N/A Post Procedural Pain: Procedure was tolerated well N/A N/A Debridement Treatment Response: 2.5x2.4x1.8 N/A N/A Post Debridement Measurements L x W x [name] 8.482 N/A N/A Post Debridement Volume: (cm) Callus: Yes N/A N/A Periwound Skin Texture: Maceration: Yes N/A N/A Periwound Skin Moisture: No Abnormalities  Noted N/A N/A Periwound Skin Color: No Abnormality N/A N/A Temperature: Cellular or Tissue Based Product N/A N/A Procedures Performed: Debridement Negative Pressure Wound Therapy Maintenance (NPWT) Treatment Notes Electronic Signature(s) Signed: 06/08/2022 9:08:28 AM By: [name] MD FACS Entered By: [name] on 06/08/2022 09:08:28 -------------------------------------------------------------------------------- Multi-Disciplinary Care Plan Details Patient Name: Date of Service: [name], [name] 06/08/2022 8:15 A M Medical Record Number: [id number] Patient Account Number: [id number] Date of Birth/Sex: Treating RN: 10-04-1985 (37 y.o. [name] Primary Care Baani Bober: [name] Other Clinician: Referring Branko Steeves: Treating Adonys Wildes/Extender: [name] in Treatment: [place] reviewed with physician Active Inactive Nutrition Nursing Diagnoses: Imbalanced nutrition Potential for alteratiion in Nutrition/Potential for imbalanced nutrition Goals: Patient/caregiver agrees to and verbalizes understanding of need to use nutritional supplements and/or vitamins as prescribed Date Initiated: 10/24/2019 Date Inactivated: 04/06/2020 Target Resolution Date: 04/03/2020 Goal Status: Met Patient/caregiver will maintain therapeutic glucose control Date Initiated: 10/24/2019 Target Resolution Date: 06/29/2022 Goal Status: Active Interventions: Assess HgA1c results as ordered upon admission and as needed Assess patient nutrition upon admission and as needed per policy Provide education on elevated blood sugars and impact on wound healing Provide education on nutrition Treatment Activities: Education provided on Nutrition : 12/07/2021 Notes: 11/17/20: Glucose control ongoing issue, target date extended. 01/26/21: Glucose management continues. [place], [name] ([id number]) [id number] Page 4 of  7 Wound/Skin Impairment Nursing Diagnoses: Impaired tissue integrity Knowledge deficit related to ulceration/compromised skin integrity Goals: Patient/caregiver will verbalize understanding of skin care regimen Date Initiated: 10/24/2019 Target Resolution Date: 06/29/2022 Goal Status: Active Ulcer/skin breakdown will have a volume reduction of 30% by week 4 Date Initiated: 10/24/2019 Date Inactivated: 01/16/2020 Target Resolution Date: 01/10/2020 Unmet Reason: no change in Goal Status: Unmet measurements. Interventions: Assess patient/caregiver ability to obtain necessary supplies Assess patient/caregiver ability to perform ulcer/skin care regimen upon admission and as needed Assess ulceration(s) every visit Provide education on ulcer and skin care Notes: 11/17/20: Wound care regimen continues Electronic Signature(s) Signed: 06/08/2022 4:54:22 PM By: Blanche East RN Entered By: Blanche East on 06/08/2022 08:26:18 -------------------------------------------------------------------------------- Negative Pressure Wound Therapy Maintenance (NPWT) Details Patient Name: Date of Service: [name] 06/08/2022 8:15 A M Medical Record Number: [id number] Patient Account Number: [id number] Date of Birth/Sex: Treating RN: [birth year] (37 y.o. [name] Primary Care Milinda Sweeney: [name] Other Clinician: Referring Jarica Plass: Treating Marlan Steward/Extender: [name],  Michel Bickers, Ronald Weeks in Treatment: 136 NPWT Maintenance Performed for: Wound #3 Left, Lateral, Plantar Foot Performed By: Baruch Gouty, RN Type: VAC System Coverage Size (sq cm): 6 Pressure Type: Constant Pressure Setting: 100 mmHG Drain Type: None Primary Contact: Other : apligraf Sponge/Dressing Type: Foam- Black Date Initiated: 02/11/2022 Dressing Removed: Yes Quantity of Sponges/Gauze Removed: 11 Canister Changed: No Dressing Reapplied: Yes Quantity of Sponges/Gauze Inserted: 1 Respones T  Treatment: o good Days On NPWT : 118 Post Procedure Diagnosis Same as Pre-procedure Electronic Signature(s) Signed: 06/08/2022 5:28:33 PM By: Baruch Gouty RN, BSN Entered By: Baruch Gouty on 06/08/2022 08:53:36 -------------------------------------------------------------------------------- Pain Assessment Details Patient Name: Date of Service: [name], [name] 06/08/2022 8:15 A M Medical Record Number: [id number] Patient Account Number: [id number] Date of Birth/Sex: Treating RN: June 15, 1985 (37 y.o. [name] Primary Care Khairi Garman: [name] Other Clinician: Referring Tiant Peixoto: Treating Pandora Mccrackin/Extender: Wilfrid Lund [place], [name] ([id number]) [id number] Page 5 of 7 Weeks in Treatment: 136 Active Problems Location of Pain Severity and Description of Pain Patient Has Paino No Site Locations Rate the pain. Current Pain Level: 0 Pain Management and Medication Current Pain Management: Electronic Signature(s) Signed: 06/08/2022 4:54:22 PM By: Blanche East RN Entered By: Blanche East on 06/08/2022 08:14:56 -------------------------------------------------------------------------------- Patient/Caregiver Education Details Patient Name: Date of Service: [name], [name] 3/6/2024andnbsp8:15 A M Medical Record Number: [id number] Patient Account Number: [id number] Date of Birth/Gender: Treating RN: [birth year] (37 y.o. [name] Primary Care Physician: [name] Other Clinician: Referring Physician: Treating Physician/Extender: [name] in Treatment: 136 Education Assessment Education Provided To: Patient Education Topics Provided Offloading: Methods: Explain/Verbal Responses: Reinforcements needed, State content correctly Wound Debridement: Methods: Explain/Verbal Responses: Reinforcements needed, State content correctly Wound/Skin Impairment: Methods:  Explain/Verbal Responses: Reinforcements needed, State content correctly Electronic Signature(s) Signed: 06/08/2022 4:54:22 PM By: Blanche East RN Entered By: Blanche East on 06/08/2022 08:38:25 [name], [name] ([id number] Page 6 of 7 -------------------------------------------------------------------------------- Wound Assessment Details Patient Name: Date of Service: [name] 06/08/2022 8:15 A M Medical Record Number: [id number] Patient Account Number: [id number] Date of Birth/Sex: Treating RN: 1986-03-14 (37 y.o. [name] Primary Care Erricka Falkner: [name] Other Clinician: Referring Rhodes Calvert: Treating Shavelle Runkel/Extender: [name] in Treatment: 136 Wound Status Wound Number: 3 Primary Etiology: Diabetic Wound/Ulcer of the Lower Extremity Wound Location: Left, Lateral, Plantar Foot Wound Status: Open Wounding Event: Trauma Comorbid History: Type II Diabetes Date Acquired: 10/02/2019 Weeks Of Treatment: 136 Clustered Wound: No Photos Wound Measurements Length: (cm) 2.5 Width: (cm) 2.4 Depth: (cm) 1.8 Area: (cm) 4.712 Volume: (cm) 8.482 % Reduction in Area: -185.7% % Reduction in Volume: -5040.6% Epithelialization: Small (1-33%) Tunneling: No Undermining: No Wound Description Classification: Grade 2 Wound Margin: Distinct, outline attached Exudate Amount: Medium Exudate Type: Serosanguineous Exudate Color: red, brown Foul Odor After Cleansing: No Slough/Fibrino Yes Wound Bed Granulation Amount: Large (67-100%) Exposed Structure Granulation Quality: Red, Pale Fascia Exposed: No Necrotic Amount: Small (1-33%) Fat Layer (Subcutaneous Tissue) Exposed: Yes Necrotic Quality: Adherent Slough Tendon Exposed: No Muscle Exposed: No Joint Exposed: No Bone Exposed: No Periwound Skin Texture Texture Color No Abnormalities Noted: No No Abnormalities Noted: Yes Callus: Yes Temperature /  Pain Temperature: No Abnormality Moisture No Abnormalities Noted: No Maceration: Yes Treatment Notes Wound #3 (Foot) Wound Laterality: Plantar, Left, Lateral Cleanser Soap and Water Discharge Instruction: May shower and wash wound with dial antibacterial soap and water prior to dressing change. Peri-Wound Care Skin Prep Discharge  Instruction: Use skin prep as directed [name], [name] ([id number]) [id number] Page 7 of 7 Topical Primary Dressing Cutimed Sorbact Swab Discharge Instruction: Apply to wound bed as instructed Apligraf VAC Secondary Dressing Zetuvit Plus 4x8 in Discharge Instruction: Apply over primary dressing as directed. Secured With Elastic Bandage 4 inch (ACE bandage) Discharge Instruction: Secure with ACE bandage as directed. 48M Medipore Soft Cloth Surgical T 2x10 (in/yd) ape Discharge Instruction: Secure with tape as directed. Compression Wrap Kerlix Roll 4.5x3.1 (in/yd) Discharge Instruction: Apply Kerlix and Coban compression as directed. Compression Stockings Add-Ons Electronic Signature(s) Signed: 06/08/2022 4:54:22 PM By: Blanche East RN Entered By: Blanche East on 06/08/2022 08:25:37 -------------------------------------------------------------------------------- Vitals Details Patient Name: Date of Service: [name], [name] 06/08/2022 8:15 A M Medical Record Number: [id number] Patient Account Number: [id number] Date of Birth/Sex: Treating RN: 1985/11/14 (37 y.o. [name] Primary Care Kingsly Kloepfer: [name] Other Clinician: Referring Finneas Mathe: Treating Shaul Trautman/Extender: [name] in Treatment: 136 Vital Signs Time Taken: 08:14 Temperature (F): 98.1 Height (in): 77 Pulse (bpm): 99 Weight (lbs): 280 Respiratory Rate (breaths/min): 18 Body Mass Index (BMI): 33.2 Blood Pressure (mmHg): 121/72 Reference Range: 80 - 120 mg / dl Electronic Signature(s) Signed: 06/08/2022 4:54:22  PM By: Blanche East RN Entered By: Blanche East on 06/08/2022 08:14:41

## 2022-06-10 NOTE — Progress Notes (Signed)
Oh, [name] ([id number]) [id number] Page 1 of 22 Visit Report for 06/08/2022 Chief Complaint Document Details Patient Name: Date of Service: [name] D 06/08/2022 8:15 A M Medical Record Number: [id number] Patient Account Number: [id number] Date of Birth/Sex: Treating RN: [birth year] (37 y.o. M) Primary Care Provider: [name] Other Clinician: Referring Provider: Treating Provider/Extender: [name] in Treatment: [place] from: Patient Chief Complaint 01/11/2019; patient is here for review of a rather substantial wound over the left fifth plantar metatarsal head extending into the lateral part of his foot 10/24/2019; patient returns to clinic with wounds on his bilateral feet with underlying osteomyelitis biopsy-proven Electronic Signature(s) Signed: 06/08/2022 9:08:40 AM By: [name] MD FACS Entered By: [name] on 06/08/2022 09:08:40 -------------------------------------------------------------------------------- Cellular or Tissue Based Product Details Patient Name: Date of Service: [name], [name] 06/08/2022 8:15 A M Medical Record Number: [id number] Patient Account Number: [id number] Date of Birth/Sex: Treating RN: 08-03-1985 (37 y.o. [name] Primary Care Provider: [name] Other Clinician: Referring Provider: Treating Provider/Extender: [name] in Treatment: 136 Cellular or Tissue Based Product Type Wound #3 Left,Lateral,Plantar Foot Applied to: Performed By: Physician [name], MD Cellular or Tissue Based Product Type: Apligraf Level of Consciousness (Pre-procedure): Awake and Alert Pre-procedure Verification/Time Out Yes - 08:50 Taken: Location: genitalia / hands / feet / multiple digits Wound Size (sq cm): 6 Product Size (sq cm): 44 Waste Size (sq cm): 8 Waste Reason: wound size Amount of Product Applied (sq cm):  36 Instrument Used: Forceps, Scissors Lot #: [id number] Order #: 5 Expiration Date: 06/10/2022 Fenestrated: Yes Instrument: Blade Reconstituted: Yes Solution Type: saline Solution Amount: 10 ml Lot #: [id number] Solution Expiration Date: 09/01/2024 Secured: Yes Secured With: Steri-Strips Dressing Applied: Yes Primary Dressing: sorbact Procedural Pain: 0 Post Procedural Pain: 0 Response to Treatment: Procedure was tolerated well Level of Consciousness (Post- Awake and Alert procedure): Post Procedure Diagnosis Same as Pre-procedure Palardy, [name] ([id number] Page 2 of 22 Electronic Signature(s) Signed: 06/08/2022 12:55:37 PM By: [name] MD FACS Signed: 06/08/2022 5:28:33 PM By: Baruch Gouty RN, BSN Entered By: Baruch Gouty on 06/08/2022 09:00:08 -------------------------------------------------------------------------------- Debridement Details Patient Name: Date of Service: [name], [name] 06/08/2022 8:15 A M Medical Record Number: [id number] Patient Account Number: [id number] Date of Birth/Sex: Treating RN: 12/08/1985 (37 y.o. [name] Primary Care Provider: [name] Other Clinician: Referring Provider: Treating Provider/Extender: [name] in Treatment: 136 Debridement Performed for Assessment: Wound #3 Left,Lateral,Plantar Foot Performed By: Physician [name], MD Debridement Type: Debridement Severity of Tissue Pre Debridement: Fat layer exposed Level of Consciousness (Pre-procedure): Awake and Alert Pre-procedure Verification/Time Out Yes - 08:50 Taken: Pain Control: Lidocaine 4% T opical Solution T Area Debrided (L x W): otal 2.5 (cm) x 2.4 (cm) = 6 (cm) Tissue and other material debrided: Non-Viable, Subcutaneous, Skin: Epidermis Level: Skin/Subcutaneous Tissue Debridement Description: Excisional Instrument: Curette Bleeding: Minimum Hemostasis  Achieved: Pressure Procedural Pain: 0 Post Procedural Pain: 0 Response to Treatment: Procedure was tolerated well Level of Consciousness (Post- Awake and Alert procedure): Post Debridement Measurements of Total Wound Length: (cm) 2.5 Width: (cm) 2.4 Depth: (cm) 1.8 Volume: (cm) 8.482 Character of Wound/Ulcer Post Debridement: Improved Severity of Tissue Post Debridement: Fat layer exposed Post Procedure Diagnosis Same as Pre-procedure Notes Scribed for Dr. [name] by Baruch Gouty, RN Electronic Signature(s) Signed: 06/08/2022 12:55:37 PM By: [name] MD FACS Signed: 06/08/2022 5:28:33 PM By: Baruch Gouty RN, BSN  Entered By: Baruch Gouty on 06/08/2022 08:55:14 -------------------------------------------------------------------------------- HPI Details Patient Name: Date of Service: [name] D 06/08/2022 8:15 A M Medical Record Number: [id number] Patient Account Number: [id number] Date of Birth/Sex: Treating RN: 1985-10-22 (37 y.o. 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 He comes in for review of a wound over the plantar fifth metatarsal head Koeneman, [name] ([id number]) [id number] Page 3 of 22 extending into the lateral part of the foot. He was followed for this previously by his podiatrist Dr. Cornelius Moras. As the patient tells his story he went to see podiatry first for a swelling he developed on the lateral part of his fifth metatarsal head in May. He states this was "open" by podiatry and the area closed. He was followed up in June and it was again opened callus removed and it closed promptly. There were plans being made for surgery on the fifth metatarsal head in June  however his blood sugar was apparently too high for anesthesia. Apparently the area was debrided and opened again in June and it is never closed since. Looking over the records from podiatry I am really not able to follow this. It was clear when he was first seen it was before 5/14 at that point he already had a wound. By 5/17 the ulcer was resolved. I do not see anything about a procedure. On 5/28 noted to have pre-ulcerative moderate keratosis. X-ray noted 1/5 contracted toe and tailor's bunion and metatarsal deformity. On a visit date on 09/28/2018 the dorsal part of the left foot it healed and resolved. There was concern about swelling in his lower extremity he was sent to the ER.. As far as I can tell he was seen in the ER on 7/12 with an ulcer on his left foot. A DVT rule out of the left leg was negative. I do not think I have complete records from podiatry but I am not able to verify the procedures this patient states he had. He states after the last procedure the wound has never closed although I am not able to follow this in the records I have from podiatry. He has not had a recent x-ray The patient has been using Neosporin on the wound. He is wearing a Darco shoe. He is still very active up on his foot working and exercising. Past medical history; type 2 diabetes ketosis-prone, leg swelling with a negative DVT study in July. Non-smoker ABI in our clinic was 0.85 on the left 10/16; substantial wound on the plantar left fifth met head extending laterally almost to the dorsal fifth MTP. We have been using silver alginate we gave him a Darco forefoot off loader. An x-ray did not show evidence of osteomyelitis did note soft tissue emphysema which I think was due to gas tracking through an open wound. There is no doubt in my mind he requires an MRI 10/23; MRI not booked until 3 November at the earliest this is largely due to his glucose sensor in the right arm. We have been using silver alginate.  There has been an improvement 10/29; I am still not exactly sure when his MRI is booked for. He says it is the third but it is the 10th in epic. This definitely needs to be done. He is running a low-grade  fever today but no other symptoms. No real improvement in the 1 02/26/2019 patient presents today for a follow-up visit here in our clinic he is last been seen in the clinic on October 29. Subsequently we were working on getting MRI to evaluate and see what exactly was going on and where we would need to go from the standpoint of whether or not he had osteomyelitis and again what treatments were going be required. Subsequently the patient ended up being admitted to the hospital on 02/07/2019 and was discharged on 02/14/2019. This is a somewhat interesting admission with a discharge diagnosis of pneumonia due to COVID-19 although he was positive for COVID-19 when tested at the urgent care but negative x2 when he was actually in the hospital. With that being said he did have acute respiratory failure with hypoxia and it was noted he also have a left foot ulceration with osteomyelitis. With that being said he did require oxygen for his pneumonia and I level 4 L. He was placed on antivirals and steroids for the COVID-19. He was also transferred to the [place] at one point. Nonetheless he did subsequently discharged home and since being home has done much better in that regard. The CT angiogram did not show any pulmonary embolism. With regard to the osteomyelitis the patient was placed on vancomycin and Zosyn while in the hospital but has been changed to Augmentin at discharge. It was also recommended that he follow- up with wound care and podiatry. Podiatry however wanted him to see [name] according to the patient prior to them doing anything further. His hemoglobin A1c was 9.9 as noted in the hospital. Have an MRI of the left foot performed while in the hospital on 02/04/2019. This showed evidence  of septic arthritis at the fifth MTP joint and osteomyelitis involving the fifth metatarsal head and proximal phalanx. There is an overlying plantar open wound noted an abscess tracking back along the lateral aspect of the fifth metatarsal shaft. There is otherwise diffuse cellulitis and mild fasciitis without findings of polymyositis. The patient did have recently pneumonia secondary to COVID-19 I looked in the chart through epic and it does appear that the patient may need to have an additional x-ray just to ensure everything is cleared and that he has no airspace disease prior to putting him into the [name]. 03/05/2019; patient was readmitted to the clinic last week. He was hospitalized twice for a viral upper respiratory tract infection from 11/1 through 11/4 and then 11/5 through 11/12 ultimately this turned out to be Covid pneumonitis. Although he was discharged on oxygen he is not using it. He says he feels fine. He has no exercise limitation no cough no sputum. His O2 sat in our clinic today was 100% on room air. He did manage to have his MRI which showed septic arthritis at the fifth MTP joint and osteomyelitis involving the fifth metatarsal head and proximal phalanx. He received Vanco and Zosyn in the hospital and then was discharged on 2 weeks of Augmentin. I do not see any relevant cultures. He was supposed to follow-up with infectious disease but I do not see that he has an appointment. 12/8; patient saw Dr. Novella Olive of infectious disease last week. He felt that he had had adequate antibiotic therapy. He did not go to follow-up with Dr. Amalia Hailey of podiatry and I have again talked to him about the pros and cons of this. He does not want to consider a ray amputation of this  time. He is aware of the risks of recurrence, migration etc. He started HBO today and tolerated this well. He can complete the Augmentin that I gave him last week. I have looked over the lab work that Dr. Chana Bode ordered his  C-reactive protein was 3.3 and his sedimentation rate was 17. The C-reactive protein is never really been measurably that high in this patient 12/15; not much change in the wound today however he has undermining along the lateral part of the foot again more extensively than last week. He has some rims of epithelialization. We have been using silver alginate. He is undergoing hyperbarics but did not dive today 12/18; in for his obligatory first total contact cast change. Unfortunately there was pus coming from the undermining area around his fifth metatarsal head. This was cultured but will preclude reapplication of a cast. He is seen in conjunction with HBO 12/24; patient had staph lugdunensis in the wound in the undermining area laterally last time. We put him on doxycycline which should have covered this. The wound looks better today. I am going to give him another week of doxycycline before reattempting the total contact cast 12/31; the patient is completing antibiotics. Hemorrhagic debris in the distal part of the wound with some undermining distally. He also had hyper granulation. Extensive debridement with a #5 curette. The infected area that was on the lateral part of the fifth met head is closed over. I do not think he needs any more antibiotics. Patient was seen prior to HBO. Preparations for a total contact cast were made in the cast will be placed post hyperbarics 04/11/19; once again the patient arrives today without complaint. He had been in a cast all week noted that he had heavy drainage this week. This resulted in large raised areas of macerated tissue around the wound 1/14; wound bed looks better slightly smaller. Hydrofera Blue has been changing himself. He had a heavy drainage last week which caused a lot of maceration around the wound so I took him out of a total contact cast he says the drainage is actually better this week He is seen today in conjunction with HBO 1/21; returns to  clinic. He was up in [place] for a day or 2 attending a funeral. He comes back in with the wound larger and with a large area of exposed bone. He had osteomyelitis and septic arthritis of the fifth left metatarsal head while he was in hospital. He received IV antibiotics in the hospital for a prolonged period of time then 3 weeks of Augmentin. Subsequently I gave him 2 weeks of doxycycline for more superficial wound infection. When I saw this last week the wound was smaller the surface of the wound looks satisfactory. 1/28; patient missed hyperbarics today. Bone biopsy I did last time showed Enterococcus faecalis and Staphylococcus lugdunensis . He has a wide area of exposed bone. We are going to use silver alginate as of today. I had another ethical discussion with the patient. This would be recurrent osteomyelitis he is already received IV antibiotics. In this situation I think the likelihood of healing this is low. Therefore I have recommended a ray amputation and with the patient's agreement I have referred him to Dr. Doran Durand. The other issue is that his compliance with hyperbarics has been minimal because of his work schedule and given his underlying decision I am going to stop this today READMISSION 10/24/2019 MRI 09/29/2019 left foot IMPRESSION: 1. Apparent skin ulceration inferior and lateral to the  5th metatarsal base with underlying heterogeneous T2 signal and enhancement in the subcutaneous fat. Small peripherally enhancing fluid collections along the plantar and lateral aspects of the Wooldridge, [name] ([id number]) [id number] Page 4 of 22 metatarsal base suspicious for abscesses. 2. Interval amputation through the mid 5th metatarsal with nonspecific low-level marrow edema and enhancement. Given the proximity to the adjacent soft tissue inflammatory changes, osteomyelitis cannot be excluded. 3. The additional bones appear unremarkable. MRI 09/29/2019  right foot IMPRESSION: 1. Soft tissue ulceration lateral to the 5th MTP joint. There is low-level T2 hyperintensity within the 4th and 5th metatarsal heads and adjacent proximal phalanges without abnormal T1 signal or cortical destruction. These findings are nonspecific and could be seen with early marrow edema, hyperemia or early osteomyelitis. No evidence of septic joint. 2. Mild tenosynovitis and synovial enhancement associated with the extensor digitorum tendons at the level of the midfoot. 3. Diffuse low-level muscular T2 hyperintensity and enhancement, most consistent with diabetic myopathy. LEFT FOOT BONE Methicillin resistant staphylococcus aureus Staphylococcus lugdunensis MIC MIC CIPROFLOXACIN >=8 RESISTANT Resistant <=0.5 SENSI... Sensitive CLINDAMYCIN <=0.25 SENS... Sensitive >=8 RESISTANT Resistant ERYTHROMYCIN >=8 RESISTANT Resistant >=8 RESISTANT Resistant GENTAMICIN <=0.5 SENSI... Sensitive <=0.5 SENSI... Sensitive Inducible Clindamycin NEGATIVE Sensitive NEGATIVE Sensitive OXACILLIN >=4 RESISTANT Resistant 2 SENSITIVE Sensitive RIFAMPIN <=0.5 SENSI... Sensitive <=0.5 SENSI... Sensitive TETRACYCLINE <=1 SENSITIVE Sensitive <=1 SENSITIVE Sensitive TRIMETH/SULFA <=10 SENSIT Sensitive <=10 SENSIT Sensitive ... Marland Kitchen.. VANCOMYCIN 1 SENSITIVE Sensitive <=0.5 SENSI... Sensitive Right foot bone . Component 3 wk ago Specimen Description BONE Special Requests RIGHT 4 METATARSAL SAMPLE B Gram Stain NO WBC SEEN NO ORGANISMS SEEN Culture RARE METHICILLIN RESISTANT STAPHYLOCOCCUS AUREUS NO ANAEROBES ISOLATED Performed at [facility], [place] [address] [place], [place] [address] Report Status 10/08/2019 FINAL Organism ID, Bacteria METHICILLIN RESISTANT STAPHYLOCOCCUS AUREUS Resulting Agency [facility] Susceptibility Methicillin resistant staphylococcus aureus MIC CIPROFLOXACIN >=8 RESISTANT Resistant CLINDAMYCIN <=0.25 SENS... Sensitive ERYTHROMYCIN >=8 RESISTANT  Resistant GENTAMICIN <=0.5 SENSI... Sensitive Inducible Clindamycin NEGATIVE Sensitive OXACILLIN >=4 RESISTANT Resistant RIFAMPIN <=0.5 SENSI... Sensitive TETRACYCLINE <=1 SENSITIVE Sensitive TRIMETH/SULFA <=10 SENSIT Sensitive ... VANCOMYCIN 1 SENSITIVE Sensitive This is a patient we had in clinic earlier this year with a wound over his left fifth metatarsal head. 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 Doran Durand on 05/16/2019. He seems to have developed problems with wounds on his bilateral feet in June 2021 although he may have had problems earlier than this. He was in an urgent care with a right foot ulcer on 09/26/2019 and given a course of doxycycline. This was apparently after having trouble getting into see orthopedics. He was seen by podiatry on 09/28/2019 noted to have bilateral lower extremity ulcers including the left lateral fifth metatarsal base and the right subfifth met head. It was noted that had purulent drainage at that time. He required hospitalization from 6/20 through 7/2. This was because of worsening right foot wounds. He underwent bilateral operative incision and drainage and bone biopsies bilaterally. Culture results are listed above. He has been referred back to clinic by Dr. Jacqualyn Posey of podiatry. He is also followed by Dr. Megan Salon who saw him yesterday. He was discharged from hospital on Zyvox Flagyl and Levaquin and yesterday changed to doxycycline Flagyl and Levaquin. His inflammatory markers  on 6/26 showed a sedimentation rate of 129 and a C-reactive protein of 5. This is improved to 14 and 1.3 respectively. This would indicate improvement. ABIs in our clinic today were 1.23 on the right  and 1.20 on the left 11/01/2019 on evaluation today patient appears to be doing fairly well in regard to the wounds on his feet at this point. Fortunately there is no signs of active infection at this time. No fevers, chills, nausea, vomiting, or diarrhea. He currently is seeing infectious disease and still under their care at this point. Subsequently he also has both wounds which she has not been using collagen on as he did not receive that in his packaging he did not call us and let us know that. Apparently that just was missed on the order. Nonetheless we will get that straightened out today. 8/9-Patient returns for bilateral foot wounds, using Prisma with hydrogel moistened dressings, and the wounds appear stable. Patient using surgical shoes, avoiding much pressure or weightbearing as much as possible 8/16; patient has bilateral foot wounds. 1 on the right lateral foot proximally the other is on the left mid lateral foot. Both required debridement of callus and thick skin around the wounds. We have been using silver collagen 8/27; patient has bilateral lateral foot wounds. The area on the left substantially surrounded by callus and dry skin. This was removed from the wound edge. The underlying wound is small. The area on the right measured somewhat smaller today. 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 Unfortunately I did not update his antibiotics during today's visit. 9/10 I reviewed Dr. Hale Bogus last notes he felt he had completed antibiotics his inflammatory markers were reasonably well controlled. He has a small wound Jumper, [name] ([id number]) [id number] Page 5 of 22 on the lateral left foot and a tiny area on the right which is just above closed. He is using Hydrofera Blue with border foam he has bilateral surgical shoes 9/24; 2 week f/u. doing well. right foot is closed. left foot still  undermined. 10/14; right foot remains closed at the fifth met head. The area over the base of the left fifth metatarsal has a small open area but considerable undermining towards the plantar foot. Thick callus skin around this suggests an adequate pressure relief. We have talked about this. He says he is going to go back into his cam boot. I suggested a total contact cast he did not seem enamored with this suggestion 10/26; left foot base of the fifth metatarsal. Same condition as last time. He has skin over the area with an open wound however the skin is not adherent. He went to see Dr. Earleen Newport who did an x-ray and culture of his foot I have not reviewed the x-ray but the patient was not told anything. He is on doxycycline 11/11; since the patient was last here he was in the emergency room on 10/30 he was concerned about swelling in the left foot. They did not do any cultures or x-rays. They changed his antibiotics to cephalexin. Previous culture showed group B strep. The cephalexin is appropriate as doxycycline has less than predictable coverage. Arrives in clinic today with swelling over this area under the wound. He also has a new wound on the right fifth metatarsal head 11/18; the patient has a difficult wound on the lateral aspect of the left fifth metatarsal head. The wound was almost ballotable last week I opened it slightly  expecting to see purulence however there was just bleeding. I cultured this this was negative. X-ray unchanged. We are trying to get an MRI but I am not sure were going to be able to get this through his insurance. He also has an area on the right lateral fifth metatarsal head this looks healthier 12/3; the patient finally got our MRI. Surprisingly this did not show osteomyelitis. I did show the soft tissue ulceration at the lateral plantar aspect of the fifth metatarsal base with a tiny residual 6 mm abscess overlying the superficial fascia I have tried to culture this area  I have not been able to get this to grow anything. Nevertheless the protruding tissue looks aggravated. I suspect we should try to treat the underlying "abscess with broad-spectrum antibiotics. I am going to start him on Levaquin and Flagyl. He has much less edema in his legs and I am going to continue to wrap his legs and see him weekly 12/10. I started Levaquin and Flagyl on him last week. He just picked up the Flagyl apparently there was some delay. The worry is the wound on the left fifth metatarsal base which is substantial and worsening. His foot looks like he inverts at the ankle making this a weightbearing surface. Certainly no improvement in fact I think the measurements of this are somewhat worse. We have been using 12/17; he apparently just got the Levaquin yesterday this is 2 weeks after the fact. He has completed the Flagyl. The area over the left fifth metatarsal base still has protruding granulation tissue although it does not look quite as bad as it did some weeks ago. He has severe bilateral lymphedema although we have not been treating him for wounds on his legs this is definitely going to require compression. There was so much edema in the left I did not wish to put him in a total contact cast today. I am going to increase his compression from 3-4 layer. The area on the right lateral fifth met head actually look quite good and superficial. 12/23; patient arrived with callus on the right fifth met head and the substantial hyper granulated callused wound on the base of his fifth metatarsal. He says he is completing his Levaquin in 2 days but I do not think that adds up with what I gave him but I will have to double check this. We are using Hydrofera Blue on both areas. My plan is to put the left leg in a cast the week after New Year's 04/06/2020; patient's wounds about the same. Right lateral fifth metatarsal head and left lateral foot over the base of the fifth metatarsal. There is  undermining on the left lateral foot which I removed before application of total contact cast continuing with Hydrofera Blue new. Patient tells me he was seen by endocrinology today lab work was done [Dr. Kerr]. Also wondering whether he was referred to cardiology. I went over some lab work from previously does not have chronic renal failure certainly not nephrotic range proteinuria he does have very poorly controlled diabetes but this is not his most updated lab work. Hemoglobin A1c has been over 11 1/10; the patient had a considerable amount of leakage towards mid part of his left foot with macerated skin however the wound surface looks better the area on the right lateral fifth met head is better as well. I am going to change the dressing on the left foot under the total contact cast to silver alginate, continue with  Hydrofera Blue on the right. 1/20; patient was in the total contact cast for 10 days. Considerable amount of drainage although the skin around the wound does not look too bad on the left foot. The area on the right fifth metatarsal head is closed. Our nursing staff reports large amount of drainage out of the left lateral foot wound 1/25; continues with copious amounts of drainage described by our intake staff. PCR culture I did last week showed E. coli and Enterococcus faecalis and low quantities. Multiple resistance genes documented including extended spectrum beta lactamase, MRSA, MRSE, quinolone, tetracycline. The wound is not quite as good this week as it was 5 days ago but about the same size 2/3; continues with copious amounts of malodorous drainage per our intake nurse. The PCR culture I did 2 weeks ago showed E. coli and low quantities of Enterococcus. There were multiple resistance genes detected. I put Neosporin on him last week although this does not seem to have helped. The wound is slightly deeper today. Offloading continues to be an issue here although with the amount of  drainage she has a total contact cast is just not going to work 2/10; moderate amount of drainage. Patient reports he cannot get his stocking on over the dressing. I told him we have to do that the nurse gave him suggestions on how to make this work. The wound is on the bottom and lateral part of his left foot. Is cultured predominantly grew low amounts of Enterococcus, E. coli and anaerobes. There were multiple resistance genes detected including extended spectrum beta lactamase, quinolone, tetracycline. I could not think of an easy oral combination to address this so for now I am going to do topical antibiotics provided by [facility] I think the main agents here are vancomycin and an aminoglycoside. We have to be able to give him access to the wounds to get the topical antibiotic on 2/17; moderate amount of drainage this is unchanged. He has his Keystone topical antibiotic against the deep tissue culture organisms. He has been using this and changing the dressing daily. Silver alginate on the wound surface. 2/24; using Keystone antibiotic with silver alginate on the top. He had too much drainage for a total contact cast at one point although I think that is improving and I think in the next week or 2 it might be possible to replace a total contact cast I did not do this today. In general the wound surface looks healthy however he continues to have thick rims of skin and subcutaneous tissue around the wide area of the circumference which I debrided 06/04/2020 upon evaluation today patient appears to be doing well in regard to his wound. I do feel like he is showing signs of improvement. There is little bit of callus and dead tissue around the edges of the wound as well as what appears to be a little bit of a sinus tract that is off to the side laterally I would perform debridement to clear that away today. 3/17; left lateral foot. The wound looks about the same as I remember. Not much depth surface looks  healthy. No evidence of infection 3/25; left lateral foot. Wound surface looks about the same. Separating epithelium from the circumference. There really is no evidence of infection here however not making progress by my view 3/29; left lateral foot. Surface of the wound again looks reasonably healthy still thick skin and subcutaneous tissue around the wound margins. There is no evidence of infection. One  of the concerns being brought up by the nurses has again the amount of drainage vis--vis continued use of a total contact cast 4/5; left lateral foot at roughly the base of the fifth metatarsal. Nice healthy looking granulated tissue with rims of epithelialization. The overall wound measurements are not any better but the tissue looks healthy. The only concern is the amount of drainage although he has no surrounding maceration with what we have been doing recently to absorb fluid and protect his skin. He also has lymphedema. 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 He states that the kerlix/coban under the TCC rolled down and could not get it back up. He has been using an offloading scooter and has somehow hurt his right foot using this device. This happened last week. He states that the side of his right foot developed a blister and opened. The top of his foot also has a few small open wounds he thinks is due to his socks rubbing in his shoes. He has not been using any dressings to the wound. He denies purulent drainage, fever/chills or erythema to the wounds. 4/22; patient presents for 1 week follow-up. He developed new wounds to the right foot that were evaluated at last clinic visit. He continues to have a total contact cast to the left leg and he  reports no issues. He has been using silver collagen to the right foot wounds with no issues. He denies purulent drainage, fever/chills or erythema to the right foot wounds. He has no complaints today 4/25; patient presents for 1 week follow-up. He has a total contact cast of the left leg and reports no issues. He has been using silver alginate to the right foot wound. He denies purulent drainage, fever/chills or erythema to the right foot wounds. 5/2 patient presents for 1 week follow-up. T contact cast on the left. The wound which is on the base of the plantar foot at the base of the fifth metatarsal otal actually looks quite good and dimensions continue to gradually contract. HOWEVER the area on the right lateral fifth metatarsal head is much larger than what I remember from 2 weeks ago. Once more is he has significant levels of Taplin, [name] ([id number]) [id number].pdf Page 6 of 22 hypergranulation. Noteworthy that he had this same hyper granulated response on his wound on the left foot at one point in time. So much so that he I thought there was an underlying fluid collection. Based on this I think this just needs debridement. 5/9; the wound on the left actually continues to be gradually smaller with a healthy surface. Slight amount of drainage and maceration of the skin around but not too bad. However he has a large wound over the right fifth metatarsal head very much in the same configuration as his left foot wound was initially. I used silver nitrate to address the hyper granulated tissue no mechanical debridement 5/16; area on the left foot did not look as healthy this week deeper thick surrounding macerated skin and subcutaneous tissue. The area on the right foot fifth met head was about the same The area on the right ankle that we identified last week is completely broken down into an open wound presumably a stocking rubbing  issue 5/23; patient has been using a  total contact cast to the left side. He has been using silver alginate underneath. He has also been using silver alginate to the right foot wounds. He has no complaints today. He denies any signs of infection. 5/31; the left-sided wound looks some better measure smaller surface granulation looks better. We have been using silver alginate under the total contact cast The large area on his right fifth met head and right dorsal foot look about the same still using silver alginate 6/6; neither side is good as I was hoping although the surface area dimensions are better. A lot of maceration on his left and right foot around the wound edge. Area on the dorsal right foot looks better. He says he was traveling. I am not sure what does the amount of maceration around the plantar wounds may be drainage issues 6/13; in general the wound surfaces look quite good on both sides. Macerated skin and raised edges around the wound required debridement although in general especially on the left the surface area seems improved. The area on the right dorsal ankle is about the same I thought this would not be such a problem to close 6/20; not much change in either wound although the one on the right looks a little better. Both wounds have thick macerated edges to the skin requiring debridements. We have been using silver alginate. The area on the dorsal right ankle is still open I thought this would be closed. 6/28; patient comes in today with a marked deterioration in the right foot wound fifth met head. Wide area of exposed bone this is a drastic change from last time. The area on the left there we have been casting is stagnant. We have been using silver alginate in both wound areas. 7/5; bone culture I did for PCR last time was positive for Pseudomonas, group B strep, Enterococcus and Staph aureus. There was no suggestion of methicillin resistance or ampicillin resistant genes. This was resistant to tetracycline  however He comes into the clinic today with the area over his right plantar fifth metatarsal head which had been doing so well 2 weeks ago completely necrotic feeling bone. I do not know that this is going to be salvageable. The left foot wound is certainly no smaller but it has a better surface and is superficial. 7/8; patient called in this morning to say that his total contact cast was rubbing against his foot. He states he is doing fine overall. He denies signs of infection. 7/12; continued deterioration in the wound over the right fifth metatarsal head crumbling bone. This is not going to be salvageable. The patient agrees and wants to be referred to Dr. Doran Durand which we will attempt to arrange as soon as possible. I am going to continue him on antibiotics as long as that takes so I will renew those today. The area on the left foot which is the base of the fifth metatarsal continues to look somewhat better. Healthy looking tissue no depth no debridement is necessary here. 7/20; the patient was kindly seen by Dr. Doran Durand of orthopedics on 10/19/2020. 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 Towards this end we have taken him out of the total contact cast on the left we will put him in a wrap with Hydrofera Blue. As I understand things surgery is planned for 7/21  7/27; patient had his surgery last Thursday. 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 Apparently everything went well we did not still disturb that today The area on the left foot actually looks quite good. He has been much less mobile which probably explains this he did not seem to do well in the total contact cast secondary to drainage and maceration I think. 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 Fortunately there is no evidence of  active infection at this time. No fevers, chills, nausea, vomiting, or diarrhea. Overall I think that he is actually doing extremely well. Nonetheless I do believe that he is staying off of this more following the surgery in his right foot that is the reason the left is doing so great. 8/16; left plantar foot wound. This looks smaller than the last time I saw this he is using Hydrofera Blue. The surgical wound on the right foot is being followed by Dr. Doran Durand we did not look at this today. He has surgical shoes on both feet 8/23; left plantar foot wound not as good this week. Surrounding macerated skin and subcutaneous tissue everything looks moist and wet. I do not think he is offloading this adequately. He is using a surgical shoe Apparently the right foot surgical wound is not open although I did not check his foot 8/31; left plantar foot lateral aspect. Much improved this week. He has no maceration. Some improvement in the surface area of the wound but most impressively the depth is come in we are using silver alginate. The patient is a Product/process development scientist. He is asked that we write him a letter so he can go back to work. I have also tried to see if we can write something that will allow him to limit the amount of time that he is on his foot at work. 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 Hopefully the latter can be avoided 9/6; I believe the patient missed an appointment last week. He arrives in today with a wound looking roughly the same certainly no better. Undermining laterally and also inferiorly. We used molecuLight today in training with the patient's permission.. We are using silver alginate 9/21 wound is measuring bigger this week although this may have to do with the aggressive circumferential debridement last week in response to the blush fluorescence on the MolecuLight. Culture I did last week  showed significant MSSA and E. coli. I put him on Augmentin but he has not started it yet. We are also going to send this for compounded antibiotics at [facility]. There is no evidence of systemic infection 9/29; silver alginate. His Keystone arrived. He is completing Augmentin in 2 days. Offloading in a cam boot. Moderate drainage per our intake staff 10/5; using silver alginate. He has been using his [place]. He has completed his Augmentin. Per our intake nurse still a lot of drainage, far too much to consider a total contact cast. Wound measures about the same. He had the same undermining area that I defined last week from a roughly 11-3. I remove this today 10/12; using silver alginate he is using the [place]. He comes in for a nurse visit hence we are applying [employer] twice a week. Measuring slightly better today and less notable drainage. Extensive debridement of the wound edge  last time 10/18; using topical Keystone and silver alginate and a soft cast. Wound measurements about the same. Drainage was through his soft cast. We are changing this twice a week Tuesdays and Friday 10/25; comes in with moderate drainage. Still using Keystone silver alginate and a soft cast. Wound dimensions completely the same.He has a lot of edema in the left leg he has lymphedema. Asking for [name] to consider wrapping him as he cannot get his stocking on over the soft cast 11/2; comes in with moderate to large drainage slightly smaller in terms of width we have been using [place]. His wound looks satisfactory but not much improvement 11/4; patient presents today for obligatory cast change. Has no issues or complaints today. He denies signs of infection. 11/9; patient traveled this weekend to [place], was on the cast quite a bit. Staining of the cast with black material from his walking boot. Drainage was not quite as bad as we feared. Using silver alginate and Keystone 11/16; we do not have size for cast therefore we  have been putting a soft cast on him since the change on Friday. Still a significant amount of drainage necessitating changing twice a week. We have been using the Keystone at cast changes either hard or soft as well as silver alginate Comes in the clinic with things actually looking fairly good improvement in width. He says his offloading is about the same Roberge, [name] ([id number]) [id number] Page 7 of 22 02/24/2021 upon evaluation today patient actually comes back in and is doing excellent in regard to his foot ulcer this is significantly smaller even compared to the last visit. The soft cast seems to have done extremely well for him which is great news. I do not see any signs of infection minimal debridement will be needed today. 11/30; left lateral foot much improved half a centimeter improvement in surface area. No evidence of infection. He seems to be doing better with the soft cast in the TCC therefore we will continue with this. He comes back in later in the week for a change with the nurses. This is due to drainage 12/6; no improvement in dimensions. Under illumination some debris on the surface we have been using silver alginate, soft cast. If there is anything optimistic here he seems to have have less drainage 12/13. Dimensions are improved both length and width and slightly in depth. Appears to be quite healthy today. Raised edges of this thick skin and callus around the edges however. He is in a soft cast were bringing him back once for a change on Friday. Drainage is better 12/20. Dimensions are improved. He still has raised edges of thick skin and callus around the edges. We are using a soft cast 12/28; comes in today with thick callus around the wound. Using silver under alginate under a soft cast. I do not think there is much improvement in any measurement 2023 04/06/2021; patient was put in a total contact cast. Unfortunately not much change in surface  area 1/10; not much different still thick callus and skin around the edge in spite of the total contact cast. This was just debrided last week we have been using the [facility] compounded antibiotic and silver alginate under a total contact cast 1/18 the patient's wound on the left side is doing nicely. smaller HOWEVER he comes in today with a wound on the right foot laterally. blister most likely serosangquenous drainage 1/24; the patient continues to do well in terms of the plantar left  foot which is continued to contract using silver alginate under the total contact cast HOWEVER the right lateral foot is bigger with denuded skin around the edges. I used pickups and a #15 scalpel to remove this this looks like the remanence of a large blister. Cannot rule out infection. Culture in this area I did last week showed Staphylococcus lugdunensis few colonies. I am going to try to address this with his [employer] antibiotic that is done so well on the left having linezolid and this should cover the staph 2/1; the patient's wound on his left foot which was the original plantar foot wound thick skin and eschar around the edges even in the total contact cast but the wound surface does not look too bad The real problem is on how his right lateral foot at roughly the base of the fifth metatarsal. The wound is completely necrotic more worrisome than that there is swelling around the edges of this. We have been using silver alginate on both wounds and Keystone on the right foot. Unfortunately I think he is going to require systemic antibiotics while we await cultures. He did not get the x-ray done that we ordered last week [lost the prescription 2/7; disappointingly in the area on the left foot which we are treating with a total contact cast is still not closed although it is much smaller. He continues to have a lot of callus around the wound edge. -Right lateral foot culture I did last week was negative x-ray also  negative for osteomyelitis. 2/15: TCC silver alginate on the left and silver alginate on the right lateral. No real improvement in either area 05/26/2021: T oday, the wounds are roughly the same size as at his previous visit, post-debridement. He continues to endorse fairly substantial drainage, particularly on the right. He has been in a total contact cast on the left. There is still some callus surrounding this lesion. On the right, the periwound skin is quite macerated, along with surrounding callus. The center of the right-sided wound also has some dark, densely adherent material, which is very difficult to remove. 06/02/2021: Today, both wounds are slightly smaller. He has been using zinc oxide ointment around the right ulcer and the degree of maceration has improved markedly. There continues to be an area of nonviable tissue in the center of the right sided ulcer. The left-sided wound, which has been in the total contact cast. Appears clean and the degree of callus around it is less than previously. 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 He had to take the stairs and both wounds have increased in size. The left foot, which has been in a total contact cast, has developed a tunnel tracking to the lateral aspect of his foot. The nonviable tissue in the center of the right-sided ulcer remains recalcitrant to debridement. There is significant undermining surrounding the entirety of the left sided wound. 06/16/2021: The elevator at school has been fixed and the patient has been able to avoid putting as much weight on his wounds over the past week. We converted the left foot wound into a single lesion today, but despite this, the wound is actually smaller. The base is healthy with limited periwound callus. On the right, the central necrotic area is still present. He continues to be quite macerated around the right sided wound, despite  applying barrier cream. This does, however, have the benefit of softening the callus to make it more  easily removable. 06/23/2021: Today, the left wound is smaller. The lateral aspect that had opened up previously is now closed. The wound base has a healthy bed of granulation tissue and minimal slough. Unfortunately, on the right, the wound is larger and continues to be fairly macerated. He has also reopened the wound at his right ankle. He thinks this is due to the gait he has adopted secondary to his total contact cast and boot. 06/30/2021: T oday, both wounds are a little bit larger. The lateral aspect on the left has remained closed. He continues to have significant periwound maceration. The culture that I took from the right sided wound grew a population of bacteria that is not covered by his current [facility] antibiotic. The center of the right- sided wound continues to appear necrotic with nonviable fat. It probes deeper today, but does not reach bone. 07/07/2021: The periwound maceration is a little bit less today. The right lateral foot wound has some areas that appear more viable and the necrotic center also looks a little bit better. The wound on the dorsal surface of his right foot near the ankle is contracting and the surface appears healthy. The left plantar wound surface looks healthy, but there is some new undermining on the medial portion. He did get his new Keystone antibiotic and began applying that to the right foot wound on Saturday. 07/14/2021: The intake nurse reported substantial drainage from his wounds, but the periwound skin actually looks better than is typical for him. The wound on the dorsal surface of his right foot near the ankle is smaller and just has a small open area underneath some dried eschar. The left plantar wound surface looks healthy and there has been no significant accumulation of callus. The right lateral foot wound looks quite a bit better, with the central  portion, which typically appears necrotic, looking more viable albeit pale. 07/22/2021: His left foot is extremely macerated today. The wound is about the same size. The wound on the dorsal surface of his right foot near the ankle had closed, but he traumatized it removing the dressing and there is a tiny skin tear in that location. The right lateral foot wound is bigger, but the surface appears healthy. 07/30/2021: The wound on the dorsal surface of his right foot near the ankle is closed. The right lateral foot wound again is a little bit bigger due to some undermining. The periwound skin is in better condition, however. He has been applying zinc oxide. The wound surface is a little bit dry today. On the left, he does not have the substantial maceration that we frequently see. The wound itself is smaller and has a clean surface. 08/06/2021: Both wounds seem to have deteriorated over the past week. The right lateral foot wound has a dry surface but the periwound is boggy.. Overall wound dimensions are about the same. On the left, the wound is about the same size, but there is more undermining present underneath periwound callus. 08/13/2021: The right sided wound looks about the same, but on the left there has been substantial deterioration. The undermining continues to extend under periwound callus. Once this was removed, substantial extension of the wound was present. There is no odor or purulent drainage but clearly the wounds have broken down. Vajda, [name] ([id number]) [id number] Page 8 of 22 08/20/2021: The wounds look about the same today. He has been out of his total contact cast and has just been changing the dressings using topical Keystone with PolyMem  Ag, Kerlix and Ace bandages. The wound on the top of his right ankle has reopened but this is quite small. There was a little bit of purulent material that I expressed when examining this wound. 08/24/2021: After the  aggressive debridement I performed at his last visit, the wounds actually look a little bit better today. They are smaller with the exception of the wound on the top of his right ankle which is a little bit bigger as some more skin pulled off when he was changing his dressing. We are using topical Keystone with PolyMem Ag Kerlix and Ace bandages. 09/02/2021: There has been really no change to any of his wounds. 09/16/2021: The patient was hospitalized last week with nausea, vomiting, and dehydration. He says that while he was in the hospital, his wounds were not really addressed properly. T oday, both plantar foot wounds are larger and the periwound skin is macerated. The wound on the dorsum of his right foot has a scab on the top. The right foot now has a crater where previously he had had nonviable fat. It looks as though this simply died and fell out. The periwound callus is wet. 09/24/2021: His wounds have deteriorated somewhat since his last visit. The wound on the dorsum of his right foot near his ankle is larger and has more nonviable tissue present. The crater in his right foot is even deeper; I cannot quite palpate or probe to bone but I am sure it is close. The wound on his left plantar foot has an odd boggy area in the center that almost feels as though it has fluid within it. He has run out of his topical Keystone antibiotic. We are using silver alginate on his wounds. 09/29/2021: He has developed a new wound on the dorsum of his left foot near his ankle. He says he thinks his wrapping is rubbing in that site. I would concur with this as the wound on his right ankle is larger. The left foot looks about the same. The right foot has the crater that was present last week. No significant slough accumulation, but his foot remains quite swollen and warm despite oral antibiotic therapy. 10/08/2021: All of his wounds look about the same as last week. He did not start his oral antibiotics that are  prescribed until just a couple of days ago; his [employer] compounded antibiotics formula has been changed and he is awaiting delivery of the new recipe. His MRI that was scheduled for earlier this week was canceled as no prior authorization had been obtained; unfortunately the tech responsible sent an email to my old [place] email, which I no longer use nor have access to. 10/18/2021: The wounds on his bilateral dorsal feet near the ankles are both improved. They are smaller and have just some eschar and slough buildup. The left plantar wound has a fair amount of undermining, but the surface is clean. There is some periwound callus accumulation. On the right plantar foot, there is nonviable fat leading to a deep tunnel that tracks towards his dorsal medial foot. There is periwound callus and slough accumulation, as well. His right foot and leg remain swollen as compared to the left. 10/25/2021: The wounds on his bilateral dorsal feet and at the ankles have broken down somewhat. They are little bit larger than last week. The left plantar wound continues to undermine laterally but the surface is clean. The right plantar foot wound shows some decreased depth in the tunnel tracking towards his  dorsal medial foot. He has not yet had the Doppler study that I ordered; it sounds like there is some confusion about the scheduling of the procedure. In addition, the MRI was denied and I have taken steps to appeal the denial. 11/24/2021: Since our last visit, Mr. Ullom was admitted to the hospital where an MRI suggested osteomyelitis. He was taken the operating room by podiatry. Bone biopsies were negative for osteomyelitis. They debrided his wounds and applied myriad matrix. He saw them last week and they removed his staples. He is here today to continue his wound healing process. T oday, both of the dorsal foot/ankle wounds are substantially smaller. There is just a little eschar overlying the left sided  wound and some eschar and slough on the right. The right plantar foot ulcer has the healthiest surface of granulation tissue that I have seen to date. A portion of the myriad matrix failed to take and was hanging loose. It appears that myriad morcells were placed into the tunnel closest to the dorsal portion of his foot. These have sloughed off. The left plantar foot ulcer is about the same size, but has a much healthier surface than in the past. Both plantar ulcers have callus and slough accumulation. 12/07/2021: Left dorsal foot/ankle wound is closed. The right dorsal foot/ankle wound is nearly closed and just has a small open area with some eschar and slough. The right plantar foot wound has contracted quite a bit since our last visit. It has a healthy surface with just a little bit of slough accumulation and periwound callus buildup. The left plantar foot wound is about the same size but the surface appears healthy. There is a little slough and periwound callus on this side, as well. 12/15/2021: Both dorsal foot/ankle wounds are closed. The right plantar foot wound is substantially smaller than at our last visit. The tunneling that was present has nearly closed. There is just a little bit of slough buildup. The left plantar foot wound is also a little bit smaller today. The surface is the healthiest that I have ever seen it. Light slough and periwound callus accumulation on this side. 12/22/2021: The dorsal ankle/foot wounds remain closed. The right plantar foot wound continues to contract. There is still a bit of depth at the lateral portion of the wound but the surface has a good granulated appearance. The wound on the left is about the same size, the central indented portion is still adherent. It also is clean with good granulation tissue present. The periwound skin and callus are a bit macerated, however. 12/29/2021: Both ankle wounds remain closed. The right plantar foot wound is less than half the  size that it was last week. There is no depth any longer and the surface has just a little bit of slough and periwound callus. On the left, the wound is not much smaller, but the surface is healthier with good granulation tissue. There is also a little slough and periwound callus buildup. 01/05/2022: The right plantar foot wound continues to contract and is once again about half the size as it was last week. There is just a little bit of surface slough and periwound callus. On the left, the depth of the wound has decreased and the diameter has started to contract. It is also very clean with just a little slough and biofilm present. 01/12/2022: The right plantar foot wound is smaller again today. There is just some periwound callus and slough accumulation. On the left, there is a  fair amount of undermining and the surface is a little boggy, but no other significant change. 01/19/2022: The right plantar wound continues to contract. There is minimal periwound callus accumulation and just a light layer of slough on the surface. On the left, the surface looks better, but there is fairly significant undermining near the 11:00 portion of the wound, aiming towards his great toe. The skin overlying this area is very healthy and has a good fat layer present. No malodor or purulent drainage. 01/26/2022: The right sided wound continues to contract and has just a light layer of slough on the surface. Minimal periwound callus. On the left, he still has substantial undermining and the tissue surface is not as robust as I would like to see. No malodor or purulent drainage, but he did say he had a lot of serous drainage over the weekend. 02/02/2022: The right foot wound is about a quarter of the size that it was at his last visit. There is just a little slough on the surface and some periwound callus. On the left, the undermining persists and the tissue is still more pale, but he does not seem to have had as much  drainage over the last weekend. We are waiting on a wound VAC. 02/09/2022: His right foot has healed. He received the wound VAC, but did not bring it to clinic with him today. The lateral aspect of the left foot wound has come in a little, but he still has substantial undermining on the medial and distal portion of the wound. Still with macerated periwound callus. 02/16/2022: The wound VAC was applied last Friday. T oday, there has been tremendous improvement in the surface of the wound bed. The undermined portion of the wound has come in a bit. There is still some overhanging dead skin. Overall the wound looks much better. 02/23/2022: No significant change in the overall wound dimensions, but the wound surface continues to improve and appears healthier and more robust. There is some periwound callus accumulation and overhanging skin. No concern for infection. 03/02/2022: Although the measurements taken in clinic today were unchanged, the visual appearance of the wound looks like it is smaller and the [place], [name] ([id number]) [id number] Page 9 of 22 undermining/tunneling is filling with flesh. There is less periwound tissue maceration than usual. No malodor or purulent drainage. No concern for infection. 03/09/2022: The undermining has come in by couple of millimeters. The periwound is a little bit more macerated this week. No concern for infection. 12/13; substantial wound on the left plantar foot. We are using silver collagen and a wound VAC. 03/23/2022: The undermining continues to contract. The wound surface is a little bit dry. 03/30/2022: The undermining has essentially resolved. There is still some depth at the center of the wound. The wound surface has good beefy tissue and the moisture balance is better. 04/06/2022: The wound dimensions are about the same, except that the depth is beginning to fill in. He has had more drainage this week, but there is no  obvious concern for infection. 04/13/2022: He has built up a little bit of callus around the edges of the wound, creating some undermining. Otherwise the wound looks fairly unchanged. 04/20/2022: The wound bed tissue looks very healthy and robust. Light biofilm on the surface. There is some built up wet callus around the wound edges, but no undermining. 04/27/2022: The wound surface continues to look very healthy. Although the wound dimensions measured the same, it looks smaller to me. No real callus  accumulation this week. Minimal slough on the surface. 05/04/2022: The wound measured smaller and shallower this week. We are still awaiting insurance approval for Apligraf. 05/11/2022: The wound is about the same in terms of depth but measured a little bit narrower today. He has built up a rim of moist callus around the edges. He has been approved for Apligraf and we will apply that today. 05/18/2022: The wound measured a little bit smaller again today. No significant callus buildup this week. 05/25/2022: The wound was measured about the same size today, but the multiple nooks and crannies in the depths of the wound seem to be filling in. 06/01/2022: The wound dimensions are smaller today, with the exception of the depth. There is a little bit of moist callus around the edges. 06/08/2022: The wound continues to contract aside from the depth. Once again, there is moist callus around the edges. The odor is less profound this week. Electronic Signature(s) Signed: 06/08/2022 9:11:29 AM By: [name] MD FACS Entered By: [name] on 06/08/2022 09:11:28 -------------------------------------------------------------------------------- Physical Exam Details Patient Name: Date of Service: [name], [name] 06/08/2022 8:15 A M Medical Record Number: [id number] Patient Account Number: [id number] Date of Birth/Sex: Treating RN: 01-31-1986 (37 y.o. M) Primary Care Provider: [name] Other  Clinician: Referring Provider: Treating Provider/Extender: [name] in Treatment: 136 Constitutional . . . . no acute distress. Respiratory Normal work of breathing on room air. Notes 06/08/2022: The wound continues to contract aside from the depth. Once again, there is moist callus around the edges. The odor is less profound this week. Electronic Signature(s) Signed: 06/08/2022 9:12:05 AM By: [name] MD FACS Entered By: [name] on 06/08/2022 09:12:05 -------------------------------------------------------------------------------- Physician Orders Details Patient Name: Date of Service: [name], [name] 06/08/2022 8:15 A M Medical Record Number: [id number] Patient Account Number: [id number] Date of Birth/Sex: Treating RN: 1985-09-17 (37 y.o. 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 - Dr. [name] - Room 1 with [name] Return A Wednesday 3/13 @ 08:15 am Cellular or Tissue Based Products Cellular or Tissue Based Product Type: - Apligraf #5 daptic or Mepitel. (DO NOT REMOVE). - may Cellular or Tissue Based Product applied to wound bed, secured with steri-strips, cover with A change black sponge, leave steristrips in place Bathing/ Shower/ Hygiene May shower and wash wound with soap and water. Negative Presssure Wound Therapy Wound #3 Left,Lateral,Plantar Foot Wound Vac to wound continuously at 163m/hg pressure - decrease to 100 mm/hg pressure Black Foam Edema Control  - Lymphedema / SCD / Other Bilateral Lower Extremities Avoid standing for long periods of time. Exercise regularly Compression stocking or Garment 20-30 mm/Hg pressure to: - to both legs daily Off-Loading Open toe surgical shoe to: - Both feet Additional Orders / Instructions Follow Nutritious Diet Non Wound Condition pply the following to affected area as directed: - continue to apply foam donut or cushion to healed right plantar foot A Wound Treatment Wound #3 - Foot Wound Laterality: Plantar, Left, Lateral Cleanser: Soap and Water 3 x Per Week/30 Days Discharge Instructions: May shower and wash wound with dial antibacterial soap and water  prior to dressing change. Peri-Wound Care: Skin Prep (DME) (Generic) 3 x Per Week/30 Days Discharge Instructions: Use skin prep as directed Prim Dressing: Cutimed Sorbact Swab 3 x Per Week/30 Days ary Discharge Instructions: Apply to wound bed as instructed Prim Dressing: Apligraf ary 3 x Per Week/30 Days Prim Dressing: VAC ary 3 x Per Week/30 Days Secondary Dressing: Zetuvit Plus 4x8 in (DME) (Generic) 3 x Per Week/30 Days Discharge Instructions: Apply over primary dressing as directed. Secured With: Elastic Bandage 4 inch (ACE bandage) (DME) (Generic) 3 x Per Week/30 Days Discharge Instructions: Secure with ACE bandage as directed. Secured With: 56M Medipore Public affairs consultant Surgical T 2x10 (in/yd) (Generic) 3 x Per Week/30 Days ape Discharge Instructions: Secure with tape as directed. Compression Wrap: Kerlix Roll 4.5x3.1 (in/yd) (Generic) 3 x Per Week/30 Days Discharge Instructions: Apply Kerlix and Coban compression as directed. Electronic Signature(s) Signed: 06/08/2022 12:55:37 PM By: [name] MD FACS Entered By: [name] on 06/08/2022 09:15:20 -------------------------------------------------------------------------------- Problem List Details Patient Name: Date of Service: [name], [name] 06/08/2022 8:15 A Tinnie Gens,  [name] ([id number] Page 11 of 22 Medical Record Number: [id number] Patient Account Number: [id number] Date of Birth/Sex: Treating RN: February 14, 1986 (37 y.o. [name] Primary Care Provider: [name] Other Clinician: Referring Provider: Treating Provider/Extender: [name] in Treatment: 136 Active Problems ICD-10 Encounter Code Description Active Date MDM Diagnosis L97.528 Non-pressure chronic ulcer of other part of left foot with other specified 10/24/2019 No Yes severity E11.621 Type 2 diabetes mellitus with foot ulcer 10/24/2019 No Yes Inactive Problems ICD-10 Code Description Active Date Inactive Date L97.518 Non-pressure chronic ulcer of other part of right foot with other specified severity 07/14/2020 07/14/2020 L97.518 Non-pressure chronic ulcer of other part of right foot with other specified severity 10/24/2019 10/24/2019 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, right ankle and foot 10/24/2019 10/24/2019 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 08/10/2020 08/10/2020 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, left ankle and foot 10/24/2019 10/24/2019 L97.322 Non-pressure chronic ulcer of left ankle with fat layer exposed 09/29/2021 09/29/2021 B95.62 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 10/24/2019 10/24/2019 classified elsewhere Resolved Problems Electronic Signature(s) Signed: 06/08/2022 9:08:20 AM By: [name] MD FACS Entered By: [name] on 06/08/2022 09:08:20 -------------------------------------------------------------------------------- Progress Note Details Patient Name: Date of Service: [name], [name] 06/08/2022 8:15 A M Medical Record Number: [id number] Patient Account Number: [id number] Date of Birth/Sex: Treating RN: 26-May-1985 (37 y.o. M) Primary Care Provider: [name] Other Clinician: Referring  Provider: Treating Provider/Extender: [name] in Treatment: 136 Subjective Chief Complaint Information obtained from Patient 01/11/2019; patient is here for review of a rather substantial wound over the left fifth plantar metatarsal head extending into the lateral part of his foot 10/24/2019; patient returns to clinic with wounds on his bilateral feet with underlying osteomyelitis biopsy-proven [place], [name] ([id number]) [id number] Page 12 of 22 History of Present Illness (HPI) ADMISSION 01/11/2019 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 He comes in for review of a wound over the plantar fifth metatarsal head extending into the lateral part of the foot. He was followed for this previously by his podiatrist Dr. Cornelius Moras. As the patient tells his story he went to see podiatry first for a swelling he developed on the lateral part of his fifth metatarsal head in May. He states this was "open" by podiatry and the area closed. He was followed  up in June and it was again opened callus removed and it closed promptly. There were plans being made for surgery on the fifth metatarsal head in June however his blood sugar was apparently too high for anesthesia. Apparently the area was debrided and opened again in June and it is never closed since. Looking over the records from podiatry I am really not able to follow this. It was clear when he was first seen it was before 5/14 at that point he already had a wound. By 5/17 the ulcer was resolved. I do not see anything about a procedure. On 5/28 noted to have pre-ulcerative moderate keratosis. X-ray noted 1/5 contracted toe and tailor's bunion and metatarsal deformity. On a visit date on 09/28/2018 the dorsal part of the left foot it healed and resolved. There was concern about swelling in his lower extremity he was sent to the ER.. As far as I can tell he was seen  in the ER on 7/12 with an ulcer on his left foot. A DVT rule out of the left leg was negative. I do not think I have complete records from podiatry but I am not able to verify the procedures this patient states he had. He states after the last procedure the wound has never closed although I am not able to follow this in the records I have from podiatry. He has not had a recent x-ray The patient has been using Neosporin on the wound. He is wearing a Darco shoe. He is still very active up on his foot working and exercising. Past medical history; type 2 diabetes ketosis-prone, leg swelling with a negative DVT study in July. Non-smoker ABI in our clinic was 0.85 on the left 10/16; substantial wound on the plantar left fifth met head extending laterally almost to the dorsal fifth MTP. We have been using silver alginate we gave him a Darco forefoot off loader. An x-ray did not show evidence of osteomyelitis did note soft tissue emphysema which I think was due to gas tracking through an open wound. There is no doubt in my mind he requires an MRI 10/23; MRI not booked until 3 November at the earliest this is largely due to his glucose sensor in the right arm. We have been using silver alginate. There has been an improvement 10/29; I am still not exactly sure when his MRI is booked for. He says it is the third but it is the 10th in epic. This definitely needs to be done. He is running a low-grade fever today but no other symptoms. No real improvement in the 1 02/26/2019 patient presents today for a follow-up visit here in our clinic he is last been seen in the clinic on October 29. Subsequently we were working on getting MRI to evaluate and see what exactly was going on and where we would need to go from the standpoint of whether or not he had osteomyelitis and again what treatments were going be required. Subsequently the patient ended up being admitted to the hospital on 02/07/2019 and was discharged  on 02/14/2019. This is a somewhat interesting admission with a discharge diagnosis of pneumonia due to COVID-19 although he was positive for COVID-19 when tested at the urgent care but negative x2 when he was actually in the hospital. With that being said he did have acute respiratory failure with hypoxia and it was noted he also have a left foot ulceration with osteomyelitis. With that being said he did require oxygen for his pneumonia  and I level 4 L. He was placed on antivirals and steroids for the COVID-19. He was also transferred to the [facility] at one point. Nonetheless he did subsequently discharged home and since being home has done much better in that regard. The CT angiogram did not show any pulmonary embolism. With regard to the osteomyelitis the patient was placed on vancomycin and Zosyn while in the hospital but has been changed to Augmentin at discharge. It was also recommended that he follow- up with wound care and podiatry. Podiatry however wanted him to see [name] according to the patient prior to them doing anything further. His hemoglobin A1c was 9.9 as noted in the hospital. Have an MRI of the left foot performed while in the hospital on 02/04/2019. This showed evidence of septic arthritis at the fifth MTP joint and osteomyelitis involving the fifth metatarsal head and proximal phalanx. There is an overlying plantar open wound noted an abscess tracking back along the lateral aspect of the fifth metatarsal shaft. There is otherwise diffuse cellulitis and mild fasciitis without findings of polymyositis. The patient did have recently pneumonia secondary to COVID-19 I looked in the chart through epic and it does appear that the patient may need to have an additional x-ray just to ensure everything is cleared and that he has no airspace disease prior to putting him into the [name]. 03/05/2019; patient was readmitted to the clinic last week. He was hospitalized twice for a viral  upper respiratory tract infection from 11/1 through 11/4 and then 11/5 through 11/12 ultimately this turned out to be Covid pneumonitis. Although he was discharged on oxygen he is not using it. He says he feels fine. He has no exercise limitation no cough no sputum. His O2 sat in our clinic today was 100% on room air. He did manage to have his MRI which showed septic arthritis at the fifth MTP joint and osteomyelitis involving the fifth metatarsal head and proximal phalanx. He received Vanco and Zosyn in the hospital and then was discharged on 2 weeks of Augmentin. I do not see any relevant cultures. He was supposed to follow-up with infectious disease but I do not see that he has an appointment. 12/8; patient saw Dr. Novella Olive of infectious disease last week. He felt that he had had adequate antibiotic therapy. He did not go to follow-up with Dr. Amalia Hailey of podiatry and I have again talked to him about the pros and cons of this. He does not want to consider a ray amputation of this time. He is aware of the risks of recurrence, migration etc. He started HBO today and tolerated this well. He can complete the Augmentin that I gave him last week. I have looked over the lab work that Dr. Chana Bode ordered his C-reactive protein was 3.3 and his sedimentation rate was 17. The C-reactive protein is never really been measurably that high in this patient 12/15; not much change in the wound today however he has undermining along the lateral part of the foot again more extensively than last week. He has some rims of epithelialization. We have been using silver alginate. He is undergoing hyperbarics but did not dive today 12/18; in for his obligatory first total contact cast change. Unfortunately there was pus coming from the undermining area around his fifth metatarsal head. This was cultured but will preclude reapplication of a cast. He is seen in conjunction with HBO 12/24; patient had staph lugdunensis in the wound  in the undermining area laterally  last time. We put him on doxycycline which should have covered this. The wound looks better today. I am going to give him another week of doxycycline before reattempting the total contact cast 12/31; the patient is completing antibiotics. Hemorrhagic debris in the distal part of the wound with some undermining distally. He also had hyper granulation. Extensive debridement with a #5 curette. The infected area that was on the lateral part of the fifth met head is closed over. I do not think he needs any more antibiotics. Patient was seen prior to HBO. Preparations for a total contact cast were made in the cast will be placed post hyperbarics 04/11/19; once again the patient arrives today without complaint. He had been in a cast all week noted that he had heavy drainage this week. This resulted in large raised areas of macerated tissue around the wound 1/14; wound bed looks better slightly smaller. Hydrofera Blue has been changing himself. He had a heavy drainage last week which caused a lot of maceration around the wound so I took him out of a total contact cast he says the drainage is actually better this week He is seen today in conjunction with HBO 1/21; returns to clinic. He was up in [place] for a day or 2 attending a funeral. He comes back in with the wound larger and with a large area of exposed bone. He had osteomyelitis and septic arthritis of the fifth left metatarsal head while he was in hospital. He received IV antibiotics in the hospital for a prolonged period of time then 3 weeks of Augmentin. Subsequently I gave him 2 weeks of doxycycline for more superficial wound infection. When I saw this last week the wound was smaller the surface of the wound looks satisfactory. 1/28; patient missed hyperbarics today. Bone biopsy I did last time showed Enterococcus faecalis and Staphylococcus lugdunensis . He has a wide area of exposed bone. We are going to use silver  alginate as of today. I had another ethical discussion with the patient. This would be recurrent osteomyelitis he is already received IV antibiotics. In this situation I think the likelihood of healing this is low. Therefore I have recommended a ray amputation and with the patient's agreement I have referred him to Dr. Doran Durand. The other issue is that his compliance with hyperbarics has been minimal because of his work schedule and given his underlying decision I am going to stop this today READMISSION 10/24/2019 MRI 09/29/2019 left foot [place], [name] ([id number]) [id number] Page 13 of 22 IMPRESSION: 1. Apparent skin ulceration inferior and lateral to the 5th metatarsal base with underlying heterogeneous T2 signal and enhancement in the subcutaneous fat. Small peripherally enhancing fluid collections along the plantar and lateral aspects of the 5th metatarsal base suspicious for abscesses. 2. Interval amputation through the mid 5th metatarsal with nonspecific low-level marrow edema and enhancement. Given the proximity to the adjacent soft tissue inflammatory changes, osteomyelitis cannot be excluded. 3. The additional bones appear unremarkable. MRI 09/29/2019 right foot IMPRESSION: 1. Soft tissue ulceration lateral to the 5th MTP joint. There is low-level T2 hyperintensity within the 4th and 5th metatarsal heads and adjacent proximal phalanges without abnormal T1 signal or cortical destruction. These findings are nonspecific and could be seen with early marrow edema, hyperemia or early osteomyelitis. No evidence of septic joint. 2. Mild tenosynovitis and synovial enhancement associated with the extensor digitorum tendons at the level of the midfoot. 3. Diffuse low-level muscular T2 hyperintensity and enhancement, most consistent with diabetic myopathy.  LEFT FOOT BONE Methicillin resistant staphylococcus aureus Staphylococcus lugdunensis MIC  MIC CIPROFLOXACIN >=8 RESISTANT Resistant <=0.5 SENSI... Sensitive CLINDAMYCIN <=0.25 SENS... Sensitive >=8 RESISTANT Resistant ERYTHROMYCIN >=8 RESISTANT Resistant >=8 RESISTANT Resistant GENTAMICIN <=0.5 SENSI... Sensitive <=0.5 SENSI... Sensitive Inducible Clindamycin NEGATIVE Sensitive NEGATIVE Sensitive OXACILLIN >=4 RESISTANT Resistant 2 SENSITIVE Sensitive RIFAMPIN <=0.5 SENSI... Sensitive <=0.5 SENSI... Sensitive TETRACYCLINE <=1 SENSITIVE Sensitive <=1 SENSITIVE Sensitive TRIMETH/SULFA <=10 SENSIT Sensitive <=10 SENSIT Sensitive ... Marland Kitchen.. VANCOMYCIN 1 SENSITIVE Sensitive <=0.5 SENSI... Sensitive Right foot bone . Component 3 wk ago Specimen Description BONE Special Requests RIGHT 4 METATARSAL SAMPLE B Gram Stain NO WBC SEEN NO ORGANISMS SEEN Culture RARE METHICILLIN RESISTANT STAPHYLOCOCCUS AUREUS NO ANAEROBES ISOLATED Performed at [facility], [place] [address], [place], [place] [address] Report Status 10/08/2019 FINAL Organism ID, Bacteria METHICILLIN RESISTANT STAPHYLOCOCCUS AUREUS Resulting Agency [facility] Susceptibility Methicillin resistant staphylococcus aureus MIC CIPROFLOXACIN >=8 RESISTANT Resistant CLINDAMYCIN <=0.25 SENS... Sensitive ERYTHROMYCIN >=8 RESISTANT Resistant GENTAMICIN <=0.5 SENSI... Sensitive Inducible Clindamycin NEGATIVE Sensitive OXACILLIN >=4 RESISTANT Resistant RIFAMPIN <=0.5 SENSI... Sensitive TETRACYCLINE <=1 SENSITIVE Sensitive TRIMETH/SULFA <=10 SENSIT Sensitive ... VANCOMYCIN 1 SENSITIVE Sensitive This is a patient we had in clinic earlier this year with a wound over his left fifth metatarsal head. 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 Doran Durand  on 05/16/2019. He seems to have developed problems with wounds on his bilateral feet in June 2021 although he may have had problems earlier than this. He was in an urgent care with a right foot ulcer on 09/26/2019 and given a course of doxycycline. This was apparently after having trouble getting into see orthopedics. He was seen by podiatry on 09/28/2019 noted to have bilateral lower extremity ulcers including the left lateral fifth metatarsal base and the right subfifth met head. It was noted that had purulent drainage at that time. He required hospitalization from 6/20 through 7/2. This was because of worsening right foot wounds. He underwent bilateral operative incision and drainage and bone biopsies bilaterally. Culture results are listed above. He has been referred back to clinic by Dr. Jacqualyn Posey of podiatry. He is also followed by Dr. Megan Salon who saw him yesterday. He was discharged from hospital on Zyvox Flagyl and Levaquin and yesterday changed to doxycycline Flagyl and Levaquin. His inflammatory markers on 6/26 showed a sedimentation rate of 129 and a C-reactive protein of 5. This is improved to 14 and 1.3 respectively. This would indicate improvement. ABIs in our clinic today were 1.23 on the right and 1.20 on the left 11/01/2019 on evaluation today patient appears to be doing fairly well in regard to the wounds on his feet at this point. Fortunately there is no signs of active infection at this time. No fevers, chills, nausea, vomiting, or diarrhea. He currently is seeing infectious disease and still under their care at this point. Subsequently he also has both wounds which she has not been using collagen on as he did not receive that in his packaging he did not call us and let us know that. Apparently that just was missed on the order. Nonetheless we will get that straightened out today. 8/9-Patient returns for bilateral foot wounds, using Prisma with hydrogel moistened dressings, and the  wounds appear stable. Patient using surgical shoes,  avoiding much pressure or weightbearing as much as possible 8/16; patient has bilateral foot wounds. 1 on the right lateral foot proximally the other is on the left mid lateral foot. Both required debridement of callus and thick skin around the wounds. We have been using silver collagen Say[place], [name] ([id number]) [phone] Page 14 of 22 8/27; patient has bilateral lateral foot wounds. The area on the left substantially surrounded by callus and dry skin. This was removed from the wound edge. The underlying wound is small. The area on the right measured somewhat smaller today. 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 Unfortunately I did not update his antibiotics during today's visit. 9/10 I reviewed Dr. Hale Bogus last notes he felt he had completed antibiotics his inflammatory markers were reasonably well controlled. He has a small wound on the lateral left foot and a tiny area on the right which is just above closed. He is using Hydrofera Blue with border foam he has bilateral surgical shoes 9/24; 2 week f/u. doing well. right foot is closed. left foot still undermined. 10/14; right foot remains closed at the fifth met head. The area over the base of the left fifth metatarsal has a small open area but considerable undermining towards the plantar foot. Thick callus skin around this suggests an adequate pressure relief. We have talked about this. He says he is going to go back into his cam boot. I suggested a total contact cast he did not seem enamored with this suggestion 10/26; left foot base of the fifth metatarsal. Same condition as last time. He has skin over the area with an open wound however the skin is not adherent. He went to see Dr. Earleen Newport who did an x-ray and culture of his foot I have not reviewed the x-ray but the patient was not told  anything. He is on doxycycline 11/11; since the patient was last here he was in the emergency room on 10/30 he was concerned about swelling in the left foot. They did not do any cultures or x-rays. They changed his antibiotics to cephalexin. Previous culture showed group B strep. The cephalexin is appropriate as doxycycline has less than predictable coverage. Arrives in clinic today with swelling over this area under the wound. He also has a new wound on the right fifth metatarsal head 11/18; the patient has a difficult wound on the lateral aspect of the left fifth metatarsal head. The wound was almost ballotable last week I opened it slightly expecting to see purulence however there was just bleeding. I cultured this this was negative. X-ray unchanged. We are trying to get an MRI but I am not sure were going to be able to get this through his insurance. He also has an area on the right lateral fifth metatarsal head this looks healthier 12/3; the patient finally got our MRI. Surprisingly this did not show osteomyelitis. I did show the soft tissue ulceration at the lateral plantar aspect of the fifth metatarsal base with a tiny residual 6 mm abscess overlying the superficial fascia I have tried to culture this area I have not been able to get this to grow anything. Nevertheless the protruding tissue looks aggravated. I suspect we should try to treat the underlying "abscess with broad-spectrum antibiotics. I am going to start him on Levaquin and Flagyl. He has much less edema in his legs and I am going to continue to wrap his legs and see him weekly 12/10.  I started Levaquin and Flagyl on him last week. He just picked up the Flagyl apparently there was some delay. The worry is the wound on the left fifth metatarsal base which is substantial and worsening. His foot looks like he inverts at the ankle making this a weightbearing surface. Certainly no improvement in fact I think the measurements of this are  somewhat worse. We have been using 12/17; he apparently just got the Levaquin yesterday this is 2 weeks after the fact. He has completed the Flagyl. The area over the left fifth metatarsal base still has protruding granulation tissue although it does not look quite as bad as it did some weeks ago. He has severe bilateral lymphedema although we have not been treating him for wounds on his legs this is definitely going to require compression. There was so much edema in the left I did not wish to put him in a total contact cast today. I am going to increase his compression from 3-4 layer. The area on the right lateral fifth met head actually look quite good and superficial. 12/23; patient arrived with callus on the right fifth met head and the substantial hyper granulated callused wound on the base of his fifth metatarsal. He says he is completing his Levaquin in 2 days but I do not think that adds up with what I gave him but I will have to double check this. We are using Hydrofera Blue on both areas. My plan is to put the left leg in a cast the week after New Year's 04/06/2020; patient's wounds about the same. Right lateral fifth metatarsal head and left lateral foot over the base of the fifth metatarsal. There is undermining on the left lateral foot which I removed before application of total contact cast continuing with Hydrofera Blue new. Patient tells me he was seen by endocrinology today lab work was done [Dr. Kerr]. Also wondering whether he was referred to cardiology. I went over some lab work from previously does not have chronic renal failure certainly not nephrotic range proteinuria he does have very poorly controlled diabetes but this is not his most updated lab work. Hemoglobin A1c has been over 11 1/10; the patient had a considerable amount of leakage towards mid part of his left foot with macerated skin however the wound surface looks better the area on the right lateral fifth met head is  better as well. I am going to change the dressing on the left foot under the total contact cast to silver alginate, continue with Hydrofera Blue on the right. 1/20; patient was in the total contact cast for 10 days. Considerable amount of drainage although the skin around the wound does not look too bad on the left foot. The area on the right fifth metatarsal head is closed. Our nursing staff reports large amount of drainage out of the left lateral foot wound 1/25; continues with copious amounts of drainage described by our intake staff. PCR culture I did last week showed E. coli and Enterococcus faecalis and low quantities. Multiple resistance genes documented including extended spectrum beta lactamase, MRSA, MRSE, quinolone, tetracycline. The wound is not quite as good this week as it was 5 days ago but about the same size 2/3; continues with copious amounts of malodorous drainage per our intake nurse. The PCR culture I did 2 weeks ago showed E. coli and low quantities of Enterococcus. There were multiple resistance genes detected. I put Neosporin on him last week although this does not seem  to have helped. The wound is slightly deeper today. Offloading continues to be an issue here although with the amount of drainage she has a total contact cast is just not going to work 2/10; moderate amount of drainage. Patient reports he cannot get his stocking on over the dressing. I told him we have to do that the nurse gave him suggestions on how to make this work. The wound is on the bottom and lateral part of his left foot. Is cultured predominantly grew low amounts of Enterococcus, E. coli and anaerobes. There were multiple resistance genes detected including extended spectrum beta lactamase, quinolone, tetracycline. I could not think of an easy oral combination to address this so for now I am going to do topical antibiotics provided by [facility] I think the main agents here are vancomycin and an  aminoglycoside. We have to be able to give him access to the wounds to get the topical antibiotic on 2/17; moderate amount of drainage this is unchanged. He has his Keystone topical antibiotic against the deep tissue culture organisms. He has been using this and changing the dressing daily. Silver alginate on the wound surface. 2/24; using Keystone antibiotic with silver alginate on the top. He had too much drainage for a total contact cast at one point although I think that is improving and I think in the next week or 2 it might be possible to replace a total contact cast I did not do this today. In general the wound surface looks healthy however he continues to have thick rims of skin and subcutaneous tissue around the wide area of the circumference which I debrided 06/04/2020 upon evaluation today patient appears to be doing well in regard to his wound. I do feel like he is showing signs of improvement. There is little bit of callus and dead tissue around the edges of the wound as well as what appears to be a little bit of a sinus tract that is off to the side laterally I would perform debridement to clear that away today. 3/17; left lateral foot. The wound looks about the same as I remember. Not much depth surface looks healthy. No evidence of infection 3/25; left lateral foot. Wound surface looks about the same. Separating epithelium from the circumference. There really is no evidence of infection here however not making progress by my view 3/29; left lateral foot. Surface of the wound again looks reasonably healthy still thick skin and subcutaneous tissue around the wound margins. There is no evidence of infection. One of the concerns being brought up by the nurses has again the amount of drainage vis--vis continued use of a total contact cast 4/5; left lateral foot at roughly the base of the fifth metatarsal. Nice healthy looking granulated tissue with rims of epithelialization. The overall  wound measurements are not any better but the tissue looks healthy. The only concern is the amount of drainage although he has no surrounding maceration with what we have been doing recently to absorb fluid and protect his skin. He also has lymphedema. 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 He states that the kerlix/coban under the TCC rolled down and could not get it back up. He has been using an offloading scooter  and has somehow hurt his right foot using this device. This happened last week. He states that the side of his right foot developed a blister and opened. The top of his foot also has a few small open wounds he thinks is due to his socks rubbing in his shoes. He has not been using any dressings to the wound. He denies purulent drainage, fever/chills or erythema to the wounds. 4/22; patient presents for 1 week follow-up. He developed new wounds to the right foot that were evaluated at last clinic visit. He continues to have a total contact cast to the left leg and he reports no issues. He has been using silver collagen to the right foot wounds with no issues. He denies purulent drainage, fever/chills or erythema to the right foot wounds. He has no complaints today 4/25; patient presents for 1 week follow-up. He has a total contact cast of the left leg and reports no issues. He has been using silver alginate to the right foot Kakar, [name] ([id number]) [phone].pdf Page 15 of 22 wound. He denies purulent drainage, fever/chills or erythema to the right foot wounds. 5/2 patient presents for 1 week follow-up. T contact cast on the left. The wound which is on the base of the plantar foot at the base of the fifth  metatarsal otal actually looks quite good and dimensions continue to gradually contract. HOWEVER the area on the right lateral fifth metatarsal head is much larger than what I remember from 2 weeks ago. Once more is he has significant levels of hypergranulation. Noteworthy that he had this same hyper granulated response on his wound on the left foot at one point in time. So much so that he I thought there was an underlying fluid collection. Based on this I think this just needs debridement. 5/9; the wound on the left actually continues to be gradually smaller with a healthy surface. Slight amount of drainage and maceration of the skin around but not too bad. However he has a large wound over the right fifth metatarsal head very much in the same configuration as his left foot wound was initially. I used silver nitrate to address the hyper granulated tissue no mechanical debridement 5/16; area on the left foot did not look as healthy this week deeper thick surrounding macerated skin and subcutaneous tissue. oo The area on the right foot fifth met head was about the same oo The area on the right ankle that we identified last week is completely broken down into an open wound presumably a stocking rubbing issue 5/23; patient has been using a total contact cast to the left side. He has been using silver alginate underneath. He has also been using silver alginate to the right foot wounds. He has no complaints today. He denies any signs of infection. 5/31; the left-sided wound looks some better measure smaller surface granulation looks better. We have been using silver alginate under the total contact cast oo The large area on his right fifth met head and right dorsal foot look about the same still using silver alginate 6/6; neither side is good as I was hoping although the surface area dimensions are better. A lot of maceration on his left and right foot around the wound edge. Area on the dorsal right  foot looks better. He says he was traveling. I am not sure what does the amount of maceration around the plantar wounds may be drainage issues 6/13; in general the wound surfaces look quite good on both sides.  Macerated skin and raised edges around the wound required debridement although in general especially on the left the surface area seems improved. oo The area on the right dorsal ankle is about the same I thought this would not be such a problem to close 6/20; not much change in either wound although the one on the right looks a little better. Both wounds have thick macerated edges to the skin requiring debridements. We have been using silver alginate. The area on the dorsal right ankle is still open I thought this would be closed. 6/28; patient comes in today with a marked deterioration in the right foot wound fifth met head. Wide area of exposed bone this is a drastic change from last time. The area on the left there we have been casting is stagnant. We have been using silver alginate in both wound areas. 7/5; bone culture I did for PCR last time was positive for Pseudomonas, group B strep, Enterococcus and Staph aureus. There was no suggestion of methicillin resistance or ampicillin resistant genes. This was resistant to tetracycline however He comes into the clinic today with the area over his right plantar fifth metatarsal head which had been doing so well 2 weeks ago completely necrotic feeling bone. I do not know that this is going to be salvageable. The left foot wound is certainly no smaller but it has a better surface and is superficial. 7/8; patient called in this morning to say that his total contact cast was rubbing against his foot. He states he is doing fine overall. He denies signs of infection. 7/12; continued deterioration in the wound over the right fifth metatarsal head crumbling bone. This is not going to be salvageable. The patient agrees and wants to be referred to Dr.  Doran Durand which we will attempt to arrange as soon as possible. I am going to continue him on antibiotics as long as that takes so I will renew those today. The area on the left foot which is the base of the fifth metatarsal continues to look somewhat better. Healthy looking tissue no depth no debridement is necessary here. 7/20; the patient was kindly seen by Dr. Doran Durand of orthopedics on 10/19/2020. 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 Towards this end we have taken him out of the total contact cast on the left we will put him in a wrap with Hydrofera Blue. As I understand things surgery is planned for 7/21 7/27; patient had his surgery last Thursday. 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 Apparently everything went well we did not still disturb that today The area on the left foot actually looks quite good. He has been much less mobile which probably explains this he did not seem to do well in the total contact cast secondary to drainage and maceration I think. 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 Fortunately there is no evidence of active infection at this time. No fevers, chills, nausea, vomiting, or diarrhea. Overall I think that he is actually doing extremely well. Nonetheless I do believe that he is staying off of this more following the surgery in his right foot that is the reason the left is doing so great. 8/16; left plantar foot wound. This looks smaller than the last time I saw  this he is using Hydrofera Blue. The surgical wound on the right foot is being followed by Dr. Doran Durand we did not look at this today. He has surgical shoes on both feet 8/23; left plantar foot wound not as good this week. Surrounding macerated skin and subcutaneous tissue everything looks moist and wet. I  do not think he is offloading this adequately. He is using a surgical shoe Apparently the right foot surgical wound is not open although I did not check his foot 8/31; left plantar foot lateral aspect. Much improved this week. He has no maceration. Some improvement in the surface area of the wound but most impressively the depth is come in we are using silver alginate. The patient is a Product/process development scientist. He is asked that we write him a letter so he can go back to work. I have also tried to see if we can write something that will allow him to limit the amount of time that he is on his foot at work. 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 Hopefully the latter can be avoided 9/6; I believe the patient missed an appointment last week. He arrives in today with a wound looking roughly the same certainly no better. Undermining laterally and also inferiorly. We used molecuLight today in training with the patient's permission.. We are using silver alginate 9/21 wound is measuring bigger this week although this may have to do with the aggressive circumferential debridement last week in response to the blush fluorescence on the MolecuLight. Culture I did last week showed significant MSSA and E. coli. I put him on Augmentin but he has not started it yet. We are also going to send this for compounded antibiotics at [facility]. There is no evidence of systemic infection 9/29; silver alginate. His Keystone arrived. He is completing Augmentin in 2 days. Offloading in a cam boot. Moderate drainage per our intake staff 10/5; using silver alginate. He has been using his [place]. He has completed his Augmentin. Per our intake nurse still a lot of drainage, far too much to consider a total contact cast. Wound measures about the same. He had the same undermining area that I defined last week from a roughly 11-3. I remove  this today 10/12; using silver alginate he is using the [place]. He comes in for a nurse visit hence we are applying [employer] twice a week. Measuring slightly better today and less notable drainage. Extensive debridement of the wound edge last time 10/18; using topical Keystone and silver alginate and a soft cast. Wound measurements about the same. Drainage was through his soft cast. We are changing this twice a week Tuesdays and Friday 10/25; comes in with moderate drainage. Still using Keystone silver alginate and a soft cast. Wound dimensions completely the same.He has a lot of edema in the left leg he has lymphedema. Asking for [name] to consider wrapping him as he cannot get his stocking on over the soft cast 11/2; comes in with moderate to large drainage slightly smaller in terms of width we have been using [place]. His wound looks satisfactory but not much improvement 11/4; patient presents today for obligatory cast change. Has no issues or complaints today. He denies signs of infection. 11/9; patient traveled this weekend to [place], was on the cast quite a bit. Staining of the cast with black material from his walking boot. Drainage was not quite as bad as  we feared. Using silver alginate and [place], [name] ([id number]) [id number] Page 16 of 22 11/16; we do not have size for cast therefore we have been putting a soft cast on him since the change on Friday. Still a significant amount of drainage necessitating changing twice a week. We have been using the Keystone at cast changes either hard or soft as well as silver alginate Comes in the clinic with things actually looking fairly good improvement in width. He says his offloading is about the same 02/24/2021 upon evaluation today patient actually comes back in and is doing excellent in regard to his foot ulcer this is significantly smaller even compared to the last visit. The soft cast seems to have done  extremely well for him which is great news. I do not see any signs of infection minimal debridement will be needed today. 11/30; left lateral foot much improved half a centimeter improvement in surface area. No evidence of infection. He seems to be doing better with the soft cast in the TCC therefore we will continue with this. He comes back in later in the week for a change with the nurses. This is due to drainage 12/6; no improvement in dimensions. Under illumination some debris on the surface we have been using silver alginate, soft cast. If there is anything optimistic here he seems to have have less drainage 12/13. Dimensions are improved both length and width and slightly in depth. Appears to be quite healthy today. Raised edges of this thick skin and callus around the edges however. He is in a soft cast were bringing him back once for a change on Friday. Drainage is better 12/20. Dimensions are improved. He still has raised edges of thick skin and callus around the edges. We are using a soft cast 12/28; comes in today with thick callus around the wound. Using silver under alginate under a soft cast. I do not think there is much improvement in any measurement 2023 04/06/2021; patient was put in a total contact cast. Unfortunately not much change in surface area 1/10; not much different still thick callus and skin around the edge in spite of the total contact cast. This was just debrided last week we have been using the [facility] compounded antibiotic and silver alginate under a total contact cast 1/18 the patient's wound on the left side is doing nicely. smaller HOWEVER he comes in today with a wound on the right foot laterally. blister most likely serosangquenous drainage 1/24; the patient continues to do well in terms of the plantar left foot which is continued to contract using silver alginate under the total contact cast HOWEVER the right lateral foot is bigger with denuded skin around the  edges. I used pickups and a #15 scalpel to remove this this looks like the remanence of a large blister. Cannot rule out infection. Culture in this area I did last week showed Staphylococcus lugdunensis few colonies. I am going to try to address this with his [employer] antibiotic that is done so well on the left having linezolid and this should cover the staph 2/1; the patient's wound on his left foot which was the original plantar foot wound thick skin and eschar around the edges even in the total contact cast but the wound surface does not look too bad The real problem is on how his right lateral foot at roughly the base of the fifth metatarsal. The wound is completely necrotic more worrisome than that there is swelling around the edges of  this. We have been using silver alginate on both wounds and Keystone on the right foot. Unfortunately I think he is going to require systemic antibiotics while we await cultures. He did not get the x-ray done that we ordered last week [lost the prescription 2/7; disappointingly in the area on the left foot which we are treating with a total contact cast is still not closed although it is much smaller. He continues to have a lot of callus around the wound edge. -Right lateral foot culture I did last week was negative x-ray also negative for osteomyelitis. 2/15: TCC silver alginate on the left and silver alginate on the right lateral. No real improvement in either area 05/26/2021: T oday, the wounds are roughly the same size as at his previous visit, post-debridement. He continues to endorse fairly substantial drainage, particularly on the right. He has been in a total contact cast on the left. There is still some callus surrounding this lesion. On the right, the periwound skin is quite macerated, along with surrounding callus. The center of the right-sided wound also has some dark, densely adherent material, which is very difficult to remove. 06/02/2021: Today, both  wounds are slightly smaller. He has been using zinc oxide ointment around the right ulcer and the degree of maceration has improved markedly. There continues to be an area of nonviable tissue in the center of the right sided ulcer. The left-sided wound, which has been in the total contact cast. Appears clean and the degree of callus around it is less than previously. 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 He had to take the stairs and both wounds have increased in size. The left foot, which has been in a total contact cast, has developed a tunnel tracking to the lateral aspect of his foot. The nonviable tissue in the center of the right-sided ulcer remains recalcitrant to debridement. There is significant undermining surrounding the entirety of the left sided wound. 06/16/2021: The elevator at school has been fixed and the patient has been able to avoid putting as much weight on his wounds over the past week. We converted the left foot wound into a single lesion today, but despite this, the wound is actually smaller. The base is healthy with limited periwound callus. On the right, the central necrotic area is still present. He continues to be quite macerated around the right sided wound, despite applying barrier cream. This does, however, have the benefit of softening the callus to make it more easily removable. 06/23/2021: Today, the left wound is smaller. The lateral aspect that had opened up previously is now closed. The wound base has a healthy bed of granulation tissue and minimal slough. Unfortunately, on the right, the wound is larger and continues to be fairly macerated. He has also reopened the wound at his right ankle. He thinks this is due to the gait he has adopted secondary to his total contact cast and boot. 06/30/2021: T oday, both wounds are a little bit larger. The lateral aspect on the left has remained closed. He  continues to have significant periwound maceration. The culture that I took from the right sided wound grew a population of bacteria that is not covered by his current [facility] antibiotic. The center of the right- sided wound continues to appear necrotic with nonviable fat. It probes deeper today, but does not reach bone. 07/07/2021: The periwound maceration is a little bit less today. The right lateral  foot wound has some areas that appear more viable and the necrotic center also looks a little bit better. The wound on the dorsal surface of his right foot near the ankle is contracting and the surface appears healthy. The left plantar wound surface looks healthy, but there is some new undermining on the medial portion. He did get his new Keystone antibiotic and began applying that to the right foot wound on Saturday. 07/14/2021: The intake nurse reported substantial drainage from his wounds, but the periwound skin actually looks better than is typical for him. The wound on the dorsal surface of his right foot near the ankle is smaller and just has a small open area underneath some dried eschar. The left plantar wound surface looks healthy and there has been no significant accumulation of callus. The right lateral foot wound looks quite a bit better, with the central portion, which typically appears necrotic, looking more viable albeit pale. 07/22/2021: His left foot is extremely macerated today. The wound is about the same size. The wound on the dorsal surface of his right foot near the ankle had closed, but he traumatized it removing the dressing and there is a tiny skin tear in that location. The right lateral foot wound is bigger, but the surface appears healthy. 07/30/2021: The wound on the dorsal surface of his right foot near the ankle is closed. The right lateral foot wound again is a little bit bigger due to some undermining. The periwound skin is in better condition, however. He has been applying  zinc oxide. The wound surface is a little bit dry today. On the left, he does not have the substantial maceration that we frequently see. The wound itself is smaller and has a clean surface. 08/06/2021: Both wounds seem to have deteriorated over the past week. The right lateral foot wound has a dry surface but the periwound is boggy.. Overall wound dimensions are about the same. On the left, the wound is about the same size, but there is more undermining present underneath periwound callus. L[place], [name] ([id number]) [id number] Page 17 of 22 08/13/2021: The right sided wound looks about the same, but on the left there has been substantial deterioration. The undermining continues to extend under periwound callus. Once this was removed, substantial extension of the wound was present. There is no odor or purulent drainage but clearly the wounds have broken down. 08/20/2021: The wounds look about the same today. He has been out of his total contact cast and has just been changing the dressings using topical Keystone with PolyMem Ag, Kerlix and Ace bandages. The wound on the top of his right ankle has reopened but this is quite small. There was a little bit of purulent material that I expressed when examining this wound. 08/24/2021: After the aggressive debridement I performed at his last visit, the wounds actually look a little bit better today. They are smaller with the exception of the wound on the top of his right ankle which is a little bit bigger as some more skin pulled off when he was changing his dressing. We are using topical Keystone with PolyMem Ag Kerlix and Ace bandages. 09/02/2021: There has been really no change to any of his wounds. 09/16/2021: The patient was hospitalized last week with nausea, vomiting, and dehydration. He says that while he was in the hospital, his wounds were not really addressed properly. T oday, both plantar foot wounds are larger and the  periwound skin is macerated. The wound on the dorsum  of his right foot has a scab on the top. The right foot now has a crater where previously he had had nonviable fat. It looks as though this simply died and fell out. The periwound callus is wet. 09/24/2021: His wounds have deteriorated somewhat since his last visit. The wound on the dorsum of his right foot near his ankle is larger and has more nonviable tissue present. The crater in his right foot is even deeper; I cannot quite palpate or probe to bone but I am sure it is close. The wound on his left plantar foot has an odd boggy area in the center that almost feels as though it has fluid within it. He has run out of his topical Keystone antibiotic. We are using silver alginate on his wounds. 09/29/2021: He has developed a new wound on the dorsum of his left foot near his ankle. He says he thinks his wrapping is rubbing in that site. I would concur with this as the wound on his right ankle is larger. The left foot looks about the same. The right foot has the crater that was present last week. No significant slough accumulation, but his foot remains quite swollen and warm despite oral antibiotic therapy. 10/08/2021: All of his wounds look about the same as last week. He did not start his oral antibiotics that are prescribed until just a couple of days ago; his [employer] compounded antibiotics formula has been changed and he is awaiting delivery of the new recipe. His MRI that was scheduled for earlier this week was canceled as no prior authorization had been obtained; unfortunately the tech responsible sent an email to my old [place] email, which I no longer use nor have access to. 10/18/2021: The wounds on his bilateral dorsal feet near the ankles are both improved. They are smaller and have just some eschar and slough buildup. The left plantar wound has a fair amount of undermining, but the surface is clean. There is some periwound callus  accumulation. On the right plantar foot, there is nonviable fat leading to a deep tunnel that tracks towards his dorsal medial foot. There is periwound callus and slough accumulation, as well. His right foot and leg remain swollen as compared to the left. 10/25/2021: The wounds on his bilateral dorsal feet and at the ankles have broken down somewhat. They are little bit larger than last week. The left plantar wound continues to undermine laterally but the surface is clean. The right plantar foot wound shows some decreased depth in the tunnel tracking towards his dorsal medial foot. He has not yet had the Doppler study that I ordered; it sounds like there is some confusion about the scheduling of the procedure. In addition, the MRI was denied and I have taken steps to appeal the denial. 11/24/2021: Since our last visit, Mr. Hopman was admitted to the hospital where an MRI suggested osteomyelitis. He was taken the operating room by podiatry. Bone biopsies were negative for osteomyelitis. They debrided his wounds and applied myriad matrix. He saw them last week and they removed his staples. He is here today to continue his wound healing process. T oday, both of the dorsal foot/ankle wounds are substantially smaller. There is just a little eschar overlying the left sided wound and some eschar and slough on the right. The right plantar foot ulcer has the healthiest surface of granulation tissue that I have seen to date. A portion of the myriad matrix failed to take and was hanging loose.  It appears that myriad morcells were placed into the tunnel closest to the dorsal portion of his foot. These have sloughed off. The left plantar foot ulcer is about the same size, but has a much healthier surface than in the past. Both plantar ulcers have callus and slough accumulation. 12/07/2021: Left dorsal foot/ankle wound is closed. The right dorsal foot/ankle wound is nearly closed and just has a small open area with  some eschar and slough. The right plantar foot wound has contracted quite a bit since our last visit. It has a healthy surface with just a little bit of slough accumulation and periwound callus buildup. The left plantar foot wound is about the same size but the surface appears healthy. There is a little slough and periwound callus on this side, as well. 12/15/2021: Both dorsal foot/ankle wounds are closed. The right plantar foot wound is substantially smaller than at our last visit. The tunneling that was present has nearly closed. There is just a little bit of slough buildup. The left plantar foot wound is also a little bit smaller today. The surface is the healthiest that I have ever seen it. Light slough and periwound callus accumulation on this side. 12/22/2021: The dorsal ankle/foot wounds remain closed. The right plantar foot wound continues to contract. There is still a bit of depth at the lateral portion of the wound but the surface has a good granulated appearance. The wound on the left is about the same size, the central indented portion is still adherent. It also is clean with good granulation tissue present. The periwound skin and callus are a bit macerated, however. 12/29/2021: Both ankle wounds remain closed. The right plantar foot wound is less than half the size that it was last week. There is no depth any longer and the surface has just a little bit of slough and periwound callus. On the left, the wound is not much smaller, but the surface is healthier with good granulation tissue. There is also a little slough and periwound callus buildup. 01/05/2022: The right plantar foot wound continues to contract and is once again about half the size as it was last week. There is just a little bit of surface slough and periwound callus. On the left, the depth of the wound has decreased and the diameter has started to contract. It is also very clean with just a little slough and biofilm  present. 01/12/2022: The right plantar foot wound is smaller again today. There is just some periwound callus and slough accumulation. On the left, there is a fair amount of undermining and the surface is a little boggy, but no other significant change. 01/19/2022: The right plantar wound continues to contract. There is minimal periwound callus accumulation and just a light layer of slough on the surface. On the left, the surface looks better, but there is fairly significant undermining near the 11:00 portion of the wound, aiming towards his great toe. The skin overlying this area is very healthy and has a good fat layer present. No malodor or purulent drainage. 01/26/2022: The right sided wound continues to contract and has just a light layer of slough on the surface. Minimal periwound callus. On the left, he still has substantial undermining and the tissue surface is not as robust as I would like to see. No malodor or purulent drainage, but he did say he had a lot of serous drainage over the weekend. 02/02/2022: The right foot wound is about a quarter of the size that it  was at his last visit. There is just a little slough on the surface and some periwound callus. On the left, the undermining persists and the tissue is still more pale, but he does not seem to have had as much drainage over the last weekend. We are waiting on a wound VAC. 02/09/2022: His right foot has healed. He received the wound VAC, but did not bring it to clinic with him today. The lateral aspect of the left foot wound has come in a little, but he still has substantial undermining on the medial and distal portion of the wound. Still with macerated periwound callus. 02/16/2022: The wound VAC was applied last Friday. T oday, there has been tremendous improvement in the surface of the wound bed. The undermined portion of the wound has come in a bit. There is still some overhanging dead skin. Overall the wound looks much  better. Peterka, [name] ([id number]) [id number] Page 18 of 22 02/23/2022: No significant change in the overall wound dimensions, but the wound surface continues to improve and appears healthier and more robust. There is some periwound callus accumulation and overhanging skin. No concern for infection. 03/02/2022: Although the measurements taken in clinic today were unchanged, the visual appearance of the wound looks like it is smaller and the undermining/tunneling is filling with flesh. There is less periwound tissue maceration than usual. No malodor or purulent drainage. No concern for infection. 03/09/2022: The undermining has come in by couple of millimeters. The periwound is a little bit more macerated this week. No concern for infection. 12/13; substantial wound on the left plantar foot. We are using silver collagen and a wound VAC. 03/23/2022: The undermining continues to contract. The wound surface is a little bit dry. 03/30/2022: The undermining has essentially resolved. There is still some depth at the center of the wound. The wound surface has good beefy tissue and the moisture balance is better. 04/06/2022: The wound dimensions are about the same, except that the depth is beginning to fill in. He has had more drainage this week, but there is no obvious concern for infection. 04/13/2022: He has built up a little bit of callus around the edges of the wound, creating some undermining. Otherwise the wound looks fairly unchanged. 04/20/2022: The wound bed tissue looks very healthy and robust. Light biofilm on the surface. There is some built up wet callus around the wound edges, but no undermining. 04/27/2022: The wound surface continues to look very healthy. Although the wound dimensions measured the same, it looks smaller to me. No real callus accumulation this week. Minimal slough on the surface. 05/04/2022: The wound measured smaller and shallower this week. We are  still awaiting insurance approval for Apligraf. 05/11/2022: The wound is about the same in terms of depth but measured a little bit narrower today. He has built up a rim of moist callus around the edges. He has been approved for Apligraf and we will apply that today. 05/18/2022: The wound measured a little bit smaller again today. No significant callus buildup this week. 05/25/2022: The wound was measured about the same size today, but the multiple nooks and crannies in the depths of the wound seem to be filling in. 06/01/2022: The wound dimensions are smaller today, with the exception of the depth. There is a little bit of moist callus around the edges. 06/08/2022: The wound continues to contract aside from the depth. Once again, there is moist callus around the edges. The odor is less profound this week. Patient History  Information obtained from Patient. Family History No family history of Cancer, Diabetes, Hereditary Spherocytosis, Hypertension, Kidney Disease, Lung Disease, Seizures, Stroke, Thyroid Problems, Tuberculosis. Social History Never smoker, Marital Status - Single, Alcohol Use - Rarely, Drug Use - No History, Caffeine Use - Never. Medical History Eyes Denies history of Cataracts, Glaucoma, Optic Neuritis Ear/Nose/Mouth/Throat Denies history of Chronic sinus problems/congestion, Middle ear problems Hematologic/Lymphatic Denies history of Anemia, Hemophilia, Human Immunodeficiency Virus, Lymphedema, Sickle Cell Disease Respiratory Denies history of Aspiration, Asthma, Chronic Obstructive Pulmonary Disease (COPD), Pneumothorax, Sleep Apnea, Tuberculosis Cardiovascular Denies history of Angina, Arrhythmia, Congestive Heart Failure, Coronary Artery Disease, Deep Vein Thrombosis, Hypertension, Hypotension, Myocardial Infarction, Peripheral Arterial Disease, Peripheral Venous Disease, Phlebitis, Vasculitis Gastrointestinal Denies history of Cirrhosis , Colitis, Crohnoos, Hepatitis A,  Hepatitis B, Hepatitis C Endocrine Patient has history of Type II Diabetes Denies history of Type I Diabetes Immunological Denies history of Lupus Erythematosus, Raynaudoos, Scleroderma Integumentary (Skin) Denies history of History of Burn Musculoskeletal Denies history of Gout, Rheumatoid Arthritis, Osteoarthritis, Osteomyelitis Neurologic Denies history of Dementia, Neuropathy, Quadriplegia, Paraplegia, Seizure Disorder Oncologic Denies history of Received Chemotherapy, Received Radiation Psychiatric Denies history of Anorexia/bulimia, Confinement Anxiety Hospitalization/Surgery History - 11/1-11/06/2018- sepsis foot infection. - 11/4-11/5 02 sats low respiratory distress. Navedo, [name] ([id number]) [id number] Page 19 of 22 Objective Constitutional no acute distress. Vitals Time Taken: 8:14 AM, Height: 77 in, Weight: 280 lbs, BMI: 33.2, Temperature: 98.1 F, Pulse: 99 bpm, Respiratory Rate: 18 breaths/min, Blood Pressure: 121/72 mmHg. Respiratory Normal work of breathing on room air. General Notes: 06/08/2022: The wound continues to contract aside from the depth. Once again, there is moist callus around the edges. The odor is less profound this week. Integumentary (Hair, Skin) Wound #3 status is Open. Original cause of wound was Trauma. The date acquired was: 10/02/2019. The wound has been in treatment 136 weeks. The wound is located on the [place]. The wound measures 2.5cm length x 2.4cm width x 1.8cm depth; 4.712cm^2 area and 8.482cm^3 volume. There is Fat Layer (Subcutaneous Tissue) exposed. There is no tunneling or undermining noted. There is a medium amount of serosanguineous drainage noted. The wound margin is distinct with the outline attached to the wound base. There is large (67-100%) red, pale granulation within the wound bed. There is a small (1-33%) amount of necrotic tissue within the wound bed including Adherent Slough.  The periwound skin appearance had no abnormalities noted for color. The periwound skin appearance exhibited: Callus, Maceration. Periwound temperature was noted as No Abnormality. 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 There was a Excisional Skin/Subcutaneous Tissue Debridement with a total area of 6 sq cm performed by [name], MD. With the following instrument(s): Curette to remove Non-Viable tissue/material. Material removed includes Subcutaneous Tissue and Skin: Epidermis and after achieving pain control using Lidocaine 4% Topical Solution. No specimens were taken. A time out was conducted at 08:50, prior to the start of the procedure. A Minimum amount of bleeding was controlled with Pressure. The procedure was tolerated well with a pain level of 0 throughout and a pain level of 0 following the procedure. Post Debridement Measurements: 2.5cm length x 2.4cm width x 1.8cm depth; 8.482cm^3 volume. Character of Wound/Ulcer Post Debridement is improved. Severity of Tissue  Post Debridement is: Fat layer exposed. Post procedure Diagnosis Wound #3: Same as Pre-Procedure General Notes: Scribed for Dr. [name] by Baruch Gouty, RN. Pre-procedure diagnosis of Wound #3 is a Diabetic Wound/Ulcer of the Lower Extremity located on the Left,Lateral,Plantar Foot. A skin graft procedure using a bioengineered skin substitute/cellular or tissue based product was performed by [name], MD with the following instrument(s): Forceps and Scissors. Apligraf was applied and secured with Steri-Strips. 36 sq cm of product was utilized and 8 sq cm was wasted due to wound size. Post  Application, sorbact was applied. A Time Out was conducted at 08:50, prior to the start of the procedure. The procedure was tolerated well with a pain level of 0 throughout and a pain level of 0 following the procedure. Post procedure Diagnosis Wound #3: Same as Pre-Procedure . Plan Follow-up Appointments: Return Appointment in 1 week. - Dr. [name] - Room [facility] with [facility] Wednesday 3/13 @ 08:15 am Cellular or Tissue Based Products: Cellular or Tissue Based Product Type: - Apligraf #5 Cellular or Tissue Based Product applied to wound bed, secured with steri-strips, cover with Adaptic or Mepitel. (DO NOT REMOVE). - may change black sponge, leave steristrips in place Bathing/ Shower/ Hygiene: May shower and wash wound with soap and water. Negative Presssure Wound Therapy: Wound #3 Left,Lateral,Plantar Foot: Wound Vac to wound continuously at 11m/hg pressure - decrease to 100 mm/hg pressure Black Foam Edema Control - Lymphedema / SCD / Other: Avoid standing for long periods of time. Exercise regularly Compression stocking or Garment 20-30 mm/Hg pressure to: - to both legs daily Off-Loading: Open toe surgical shoe to: - Both feet Additional Orders / Instructions: Follow Nutritious Diet Defibaugh, CMali([phone] Page 20 of 22 Non Wound Condition: Apply the following to affected area as directed: - continue to apply foam donut or cushion to healed right plantar foot WOUND #3: - Foot Wound Laterality: Plantar, Left, Lateral Cleanser: Soap and Water 3 x Per Week/30 Days Discharge Instructions: May shower and wash wound with dial antibacterial soap and water prior to dressing change. Peri-Wound Care: Skin Prep (DME) (Generic) 3 x Per Week/30 Days Discharge Instructions: Use skin prep as directed Prim Dressing: Cutimed Sorbact Swab 3 x Per Week/30 Days ary Discharge Instructions: Apply to wound bed as instructed Prim Dressing: Apligraf 3 x Per Week/30  Days ary Prim Dressing: VAC 3 x Per Week/30 Days ary Secondary Dressing: Zetuvit Plus 4x8 in (DME) (Generic) 3 x Per Week/30 Days Discharge Instructions: Apply over primary dressing as directed. Secured With: Elastic Bandage 4 inch (ACE bandage) (DME) (Generic) 3 x Per Week/30 Days Discharge Instructions: Secure with ACE bandage as directed. Secured With: 33M Medipore SPublic affairs consultantSurgical T 2x10 (in/yd) (Generic) 3 x Per Week/30 Days ape Discharge Instructions: Secure with tape as directed. Com pression Wrap: Kerlix Roll 4.5x3.1 (in/yd) (Generic) 3 x Per Week/30 Days Discharge Instructions: Apply Kerlix and Coban compression as directed. 06/08/2022: The wound continues to contract aside from the depth. Once again, there is moist callus around the edges. The odor is less profound this week. I used a curette to debride moist callus/skin from the wound perimeter and subcutaneous tissue from the very depths of the wound, in an effort to stimulate the healing cascade here. Apligraf #5 was then fenestrated and applied to the wound, making sure to tuck it into all of the deep nooks of the wound. I elected to secure it with a sheet of Sorbact, rather than Adaptic as the Adaptic has been  too stiff to permit good contact of the wound VAC sponge with the deepest portion of the wound, which may be contributing to why this aspect of the wound dimensions is not responding as well as the rest of the wound to her treatment. He will follow-up in 1 week. Electronic Signature(s) Signed: 06/08/2022 9:17:19 AM By: [name] MD FACS Entered By: [name] on 06/08/2022 09:17:19 -------------------------------------------------------------------------------- HxROS Details Patient Name: Date of Service: [name], [name] 06/08/2022 8:15 A M Medical Record Number: [id number] Patient Account Number: [id number] Date of Birth/Sex: Treating RN: Oct 28, 1985 (37 y.o. M) Primary Care Provider: [name] Other  Clinician: Referring Provider: Treating Provider/Extender: [name] in Treatment: 84 Information Obtained From Patient Eyes Medical History: Negative for: Cataracts; Glaucoma; Optic Neuritis Ear/Nose/Mouth/Throat Medical History: Negative for: Chronic sinus problems/congestion; Middle ear problems Hematologic/Lymphatic Medical History: Negative for: Anemia; Hemophilia; Human Immunodeficiency Virus; Lymphedema; Sickle Cell Disease Respiratory Medical History: Negative for: Aspiration; Asthma; Chronic Obstructive Pulmonary Disease (COPD); Pneumothorax; Sleep Apnea; Tuberculosis Cardiovascular Medical History: Negative for: Angina; Arrhythmia; Congestive Heart Failure; Coronary Artery Disease; Deep Vein Thrombosis; Hypertension; Hypotension; Myocardial Infarction; Peripheral Arterial Disease; Peripheral Venous Disease; Phlebitis; Vasculitis Gastrointestinal Medical History: Yo, [name] ([id number]) [id number] Page 21 of 22 Negative for: Cirrhosis ; Colitis; Crohns; Hepatitis A; Hepatitis B; Hepatitis C Endocrine Medical History: Positive for: Type II Diabetes Negative for: Type I Diabetes Time with diabetes: 8 Treated with: Insulin Blood sugar tested every day: No Immunological Medical History: Negative for: Lupus Erythematosus; Raynauds; Scleroderma Integumentary (Skin) Medical History: Negative for: History of Burn Musculoskeletal Medical History: Negative for: Gout; Rheumatoid Arthritis; Osteoarthritis; Osteomyelitis Neurologic Medical History: Negative for: Dementia; Neuropathy; Quadriplegia; Paraplegia; Seizure Disorder Oncologic Medical History: Negative for: Received Chemotherapy; Received Radiation Psychiatric Medical History: Negative for: Anorexia/bulimia; Confinement Anxiety Immunizations Pneumococcal Vaccine: Received Pneumococcal Vaccination: No Implantable Devices None Hospitalization /  Surgery History Type of Hospitalization/Surgery 11/1-11/06/2018- sepsis foot infection 11/4-11/5 02 sats low respiratory distress Family and Social History Cancer: No; Diabetes: No; Hereditary Spherocytosis: No; Hypertension: No; Kidney Disease: No; Lung Disease: No; Seizures: No; Stroke: No; Thyroid Problems: No; Tuberculosis: No; Never smoker; Marital Status - Single; Alcohol Use: Rarely; Drug Use: No History; Caffeine Use: Never; Financial Concerns: No; Food, Clothing or Shelter Needs: No; Support System Lacking: No; Transportation Concerns: No Electronic Signature(s) Signed: 06/08/2022 12:55:37 PM By: [name] MD FACS Entered By: [name] on 06/08/2022 09:11:36 -------------------------------------------------------------------------------- SuperBill Details Patient Name: Date of Service: [name], [name] 06/08/2022 Medical Record Number: [id number] Patient Account Number: [id number] Date of Birth/Sex: Treating RN: 1985/07/16 (37 y.o. M) Primary Care Provider: [name] Other Clinician: Referring Provider: Treating Provider/Extender: [name] in Treatment: [address], [name] ([id number]) [id number] Page 22 of 22 Diagnosis Coding ICD-10 Codes Code Description L97.528 Non-pressure chronic ulcer of other part of left foot with other specified severity E11.621 Type 2 diabetes mellitus with foot ulcer Facility Procedures : CPT4 Code: [id number] Description: (Facility Use Only) Apligraf 1 SQ CM Modifier: Quantity: 44 : CPT4 Code: [id number] Description: [id number] - SKIN SUB GRAFT FACE/NK/HF/G ICD-10 Diagnosis Description L97.528 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 Modifier: ity Quantity: 1 : CPT4 Code: [id number] Description: [id number] - WOUND VAC-50 SQ CM OR LESS Modifier: Quantity: 1 Physician Procedures : CPT4 Code Description Modifier [id number] - WC PHYS LEVEL 4  - EST PT 25 ICD-10 Diagnosis Description L97.528 Non-pressure chronic ulcer of other part of left foot with other  specified severity E11.621 Type 2 diabetes mellitus with foot ulcer Quantity: 1 : [id number] - WC PHYS SKIN SUB GRAFT FACE/NK/HF/G ICD-10 Diagnosis Description L97.528 Non-pressure chronic ulcer of other part of left foot with other specified severity Quantity: 1 Electronic Signature(s) Signed: 06/08/2022 9:17:47 AM By: [name] MD FACS Entered By: [name] on 06/08/2022 09:17:46

## 2022-06-15 ENCOUNTER — Encounter (HOSPITAL_BASED_OUTPATIENT_CLINIC_OR_DEPARTMENT_OTHER): Payer: BC Managed Care – PPO | Admitting: Internal Medicine

## 2022-06-15 DIAGNOSIS — L97528 Non-pressure chronic ulcer of other part of left foot with other specified severity: Secondary | ICD-10-CM

## 2022-06-15 DIAGNOSIS — E11621 Type 2 diabetes mellitus with foot ulcer: Secondary | ICD-10-CM | POA: Diagnosis not present

## 2022-06-15 NOTE — Progress Notes (Signed)
Carmean, [name] ([id number]) [id number] Page 1 of 7 Visit Report for 06/15/2022 Arrival Information Details Patient Name: Date of Service: [name] 06/15/2022 8:15 A M Medical Record Number: [id number] Patient Account Number: [id number] Date of Birth/Sex: Treating RN: [birth year] (37 y.o. [name] Primary Care Truman Aceituno: [name] Other Clinician: Referring Janel Beane: Treating Allissa Albright/Extender: [name] in Treatment: 66 Visit Information History Since Last Visit Added or deleted any medications: No Patient Arrived: Ambulatory Any new allergies or adverse reactions: No Arrival Time: 08:06 Had a fall or experienced change in No Accompanied By: self activities of daily living that may affect Transfer Assistance: None risk of falls: Patient Identification Verified: Yes Signs or symptoms of abuse/neglect since last visito No Secondary Verification Process Completed: Yes Hospitalized since last visit: No Patient Requires Transmission-Based Precautions: No Implantable device outside of the clinic excluding No Patient Has Alerts: No cellular tissue based products placed in the center since last visit: Has Dressing in Place as Prescribed: Yes Has Compression in Place as Prescribed: Yes Pain Present Now: No Electronic Signature(s) Signed: 06/15/2022 11:58:58 AM By: Baruch Gouty RN, BSN Entered By: Baruch Gouty on 06/15/2022 08:18:25 -------------------------------------------------------------------------------- Encounter Discharge Information Details Patient Name: Date of Service: [name], [name] 06/15/2022 8:15 A M Medical Record Number: [id number] Patient Account Number: [id number] Date of Birth/Sex: Treating RN: 06-15-1985 (37 y.o. [name] Primary Care Dixie Jafri: [name] Other Clinician: Referring Ruffus Kamaka: Treating [name]/Extender: [name] in  Treatment: [id number] Encounter Discharge Information Items Discharge Condition: Stable Ambulatory Status: Ambulatory Discharge Destination: Home Transportation: Private Auto Accompanied By: self Schedule Follow-up Appointment: Yes Clinical Summary of Care: Patient Declined Electronic Signature(s) Signed: 06/15/2022 11:58:58 AM By: Baruch Gouty RN, BSN Entered By: Baruch Gouty on 06/15/2022 09:04:56 -------------------------------------------------------------------------------- Lower Extremity Assessment Details Patient Name: Date of Service: [name], [name] 06/15/2022 8:15 A M Medical Record Number: [id number] Patient Account Number: [id number] Date of Birth/Sex: Treating RN: Sep 22, 1985 (37 y.o. [name] Primary Care Bogdan Vivona: [name] Other Clinician: Referring Audwin Semper: Treating Donovyn Guidice/Extender: [name] in Treatment: 137 Edema Assessment A[Left: RMSTRONG, [name] [id number] Page 2 of 7] Assessed: [Left: No] [Right: No] Edema: [Left: Ye] [Right: s] Calf Left: Right: Point of Measurement: 48 cm From Medial Instep 48 cm Ankle Left: Right: Point of Measurement: 11 cm From Medial Instep 29.5 cm Vascular Assessment Pulses: Dorsalis Pedis Palpable: [Left:Yes] Electronic Signature(s) Signed: 06/15/2022 11:58:58 AM By: Baruch Gouty RN, BSN Entered By: Baruch Gouty on 06/15/2022 08:27:07 -------------------------------------------------------------------------------- Multi Wound Chart Details Patient Name: Date of Service: [name], [name] 06/15/2022 8:15 A M Medical Record Number: [id number] Patient Account Number: [id number] Date of Birth/Sex: Treating RN: [birth year] (37 y.o. [name] Primary Care Aleana Fifita: [name] Other Clinician: Referring Zaleah Ternes: Treating [name]/Extender: [name] in Treatment: 137 Vital Signs Height(in):  77 Capillary Blood Glucose(mg/dl): 87 Weight(lbs): 280 Pulse(bpm): 97 Body Mass Index(BMI): 33.2 Blood Pressure(mmHg): 138/94 Temperature(F): 97.9 Respiratory Rate(breaths/min): 18 [3:Photos:] [N/A:N/A] Left, Lateral, Plantar Foot N/A N/A Wound Location: Trauma N/A N/A Wounding Event: Diabetic Wound/Ulcer of the Lower N/A N/A Primary Etiology: Extremity Type II Diabetes N/A N/A Comorbid History: 10/02/2019 N/A N/A Date Acquired: 137 N/A N/A Weeks of Treatment: Open N/A N/A Wound Status: No N/A N/A Wound Recurrence: 2.4x2.5x1.8 N/A N/A Measurements L x W x [name] 4.712 N/A N/A A (cm) : rea 8.482 N/A N/A Volume (cm) : -185.70% N/A N/A %  Reduction in A rea: -5040.60% N/A N/A % Reduction in Volume: Grade 2 N/A N/A Classification: Medium N/A N/A Exudate A mount: Serosanguineous N/A N/A Exudate Type: red, brown N/A N/A Exudate Color: Yes N/A N/A Foul Odor A Cleansing: fter No N/A N/A Odor A nticipated Due to Product Use: Distinct, outline attached N/A N/A Wound Margin: Large (67-100%) N/A N/A Granulation A mount: Red N/A N/A Granulation Quality: Gleed, [name] ([id number] Page 3 of 7 Small (1-33%) N/A N/A Necrotic Amount: Fat Layer (Subcutaneous Tissue): Yes N/A N/A Exposed Structures: Fascia: No Tendon: No Muscle: No Joint: No Bone: No Small (1-33%) N/A N/A Epithelialization: Callus: Yes N/A N/A Periwound Skin Texture: Maceration: Yes N/A N/A Periwound Skin Moisture: No Abnormalities Noted N/A N/A Periwound Skin Color: No Abnormality N/A N/A Temperature: Negative Pressure Wound Therapy N/A N/A Procedures Performed: Maintenance (NPWT) Treatment Notes Electronic Signature(s) Signed: 06/15/2022 11:58:37 AM By: [name] DO Signed: 06/15/2022 11:58:58 AM By: Baruch Gouty RN, BSN Entered By: [name] on 06/15/2022  08:53:35 -------------------------------------------------------------------------------- Multi-Disciplinary Care Plan Details Patient Name: Date of Service: [name], [name] 06/15/2022 8:15 A M Medical Record Number: [id number] Patient Account Number: [id number] Date of Birth/Sex: Treating RN: 07-Jul-1985 (37 y.o. [name] Primary Care Tyri Elmore: [name] Other Clinician: Referring Madysen Faircloth: Treating [name]/Extender: [name] in Treatment: [place] reviewed with physician Active Inactive Nutrition Nursing Diagnoses: Imbalanced nutrition Potential for alteratiion in Nutrition/Potential for imbalanced nutrition Goals: Patient/caregiver agrees to and verbalizes understanding of need to use nutritional supplements and/or vitamins as prescribed Date Initiated: 10/24/2019 Date Inactivated: 04/06/2020 Target Resolution Date: 04/03/2020 Goal Status: Met Patient/caregiver will maintain therapeutic glucose control Date Initiated: 10/24/2019 Target Resolution Date: 06/29/2022 Goal Status: Active Interventions: Assess HgA1c results as ordered upon admission and as needed Assess patient nutrition upon admission and as needed per policy Provide education on elevated blood sugars and impact on wound healing Provide education on nutrition Treatment Activities: Education provided on Nutrition : 02/02/2022 Notes: 11/17/20: Glucose control ongoing issue, target date extended. 01/26/21: Glucose management continues. Wound/Skin Impairment Nursing Diagnoses: Impaired tissue integrity Knowledge deficit related to ulceration/compromised skin integrity Goals: Patient/caregiver will verbalize understanding of skin care regimen Date Initiated: 10/24/2019 Target Resolution Date: 06/29/2022 Goal Status: Active Ulcer/skin breakdown will have a volume reduction of 30% by week 4 Brasher, [name] ([id number] Page 4 of 7 Date Initiated: 10/24/2019 Date Inactivated: 01/16/2020 Target Resolution Date: 01/10/2020 Unmet Reason: no change in Goal Status: Unmet measurements. Interventions: Assess patient/caregiver ability to obtain necessary supplies Assess patient/caregiver ability to perform ulcer/skin care regimen upon admission and as needed Assess ulceration(s) every visit Provide education on ulcer and skin care Notes: 11/17/20: Wound care regimen continues Electronic Signature(s) Signed: 06/15/2022 11:58:58 AM By: Baruch Gouty RN, BSN Entered By: Baruch Gouty on 06/15/2022 08:33:50 -------------------------------------------------------------------------------- Negative Pressure Wound Therapy Maintenance (NPWT) Details Patient Name: Date of Service: [name] 06/15/2022 8:15 A M Medical Record Number: [id number] Patient Account Number: [id number] Date of Birth/Sex: Treating RN: 1985/04/05 (37 y.o. [name] Primary Care Dejaun Vidrio: [name] Other Clinician: Referring Zelina Jimerson: Treating Aria Jarrard/Extender: [name] in Treatment: 137 NPWT Maintenance Performed for: Wound #3 Left, Lateral, Plantar Foot Performed By: Baruch Gouty, RN Type: VAC System Coverage Size (sq cm): 6 Pressure Type: Constant Pressure Setting: 125 mmHG Drain Type: None Primary Contact: Other : silver collagen Sponge/Dressing Type: Foam- Black Date Initiated: 02/11/2022 Dressing Removed: Yes Quantity of Sponges/Gauze Removed: 1 Canister Changed:  No Quantity of Sponges/Gauze Inserted: 1 Respones T Treatment: o good Days On NPWT : 125 Post Procedure Diagnosis Same as Pre-procedure Electronic Signature(s) Signed: 06/15/2022 11:58:58 AM By: Baruch Gouty RN, BSN Entered By: Baruch Gouty on 06/15/2022 08:39:50 -------------------------------------------------------------------------------- Pain Assessment  Details Patient Name: Date of Service: [name], [name] 06/15/2022 8:15 A M Medical Record Number: [id number] Patient Account Number: [id number] Date of Birth/Sex: Treating RN: 28-Jun-1985 (37 y.o. [name] Primary Care Coston Mandato: [name] Other Clinician: Referring Dahlila Pfahler: Treating [name]/Extender: [name] in Treatment: [phone] Active Problems Location of Pain Severity and Description of Pain Patient Has Paino No Site Locations Rate the pain. [name], [name] ([id number]) [id number] Page 5 of 7 Rate the pain. Current Pain Level: 0 Pain Management and Medication Current Pain Management: Electronic Signature(s) Signed: 06/15/2022 11:58:58 AM By: Baruch Gouty RN, BSN Entered By: Baruch Gouty on 06/15/2022 08:19:18 -------------------------------------------------------------------------------- Patient/Caregiver Education Details Patient Name: Date of Service: [name], [name] 3/13/2024andnbsp8:15 A M Medical Record Number: [id number] Patient Account Number: [id number] Date of Birth/Gender: Treating RN: 23-Sep-1985 (37 y.o. [name] Primary Care Physician: [name] Other Clinician: Referring Physician: Treating Physician/Extender: [name] in Treatment: 78 Education Assessment Education Provided To: Patient Education Topics Provided Offloading: Methods: Explain/Verbal Responses: Reinforcements needed, State content correctly Wound/Skin Impairment: Methods: Explain/Verbal Responses: Reinforcements needed, State content correctly Electronic Signature(s) Signed: 06/15/2022 11:58:58 AM By: Baruch Gouty RN, BSN Entered By: Baruch Gouty on 06/15/2022 08:34:17 -------------------------------------------------------------------------------- Wound Assessment Details Patient Name: Date of Service: [name], [name] 06/15/2022 8:15 A M Medical Record  Number: [id number] Patient Account Number: [id number] Date of Birth/Sex: Treating RN: Apr 20, 1985 (37 y.o. [name] Primary Care Luverta Korte: [name] Other Clinician: Referring Jetson Pickrel: Treating [name]/Extender: [name] in Treatment: 137 Wound Status Wound Number: 3 Primary Etiology: Diabetic Wound/Ulcer of the Lower Extremity Wound Location: Left, Lateral, Plantar Foot Wound Status: Open [name], [name] ([id number] Page 6 of 7 Wounding Event: Trauma Comorbid History: Type II Diabetes Date Acquired: 10/02/2019 Weeks Of Treatment: 137 Clustered Wound: No Photos Wound Measurements Length: (cm) 2.4 Width: (cm) 2.5 Depth: (cm) 1.8 Area: (cm) 4.712 Volume: (cm) 8.482 % Reduction in Area: -185.7% % Reduction in Volume: -5040.6% Epithelialization: Small (1-33%) Tunneling: No Undermining: No Wound Description Classification: Grade 2 Wound Margin: Distinct, outline attached Exudate Amount: Medium Exudate Type: Serosanguineous Exudate Color: red, brown Foul Odor After Cleansing: Yes Due to Product Use: No Slough/Fibrino Yes Wound Bed Granulation Amount: Large (67-100%) Exposed Structure Granulation Quality: Red Fascia Exposed: No Necrotic Amount: Small (1-33%) Fat Layer (Subcutaneous Tissue) Exposed: Yes Necrotic Quality: Adherent Slough Tendon Exposed: No Muscle Exposed: No Joint Exposed: No Bone Exposed: No Periwound Skin Texture Texture Color No Abnormalities Noted: No No Abnormalities Noted: Yes Callus: Yes Temperature / Pain Temperature: No Abnormality Moisture No Abnormalities Noted: No Maceration: Yes Treatment Notes Wound #3 (Foot) Wound Laterality: Plantar, Left, Lateral Cleanser Soap and Water Discharge Instruction: May shower and wash wound with dial antibacterial soap and water prior to dressing change. Vashe 5.8 (oz) Discharge Instruction: Cleanse the wound with  Vashe prior to applying a clean dressing, let sit on wound for 5-10 minutesusing gauze sponges, not tissue or cotton balls. Peri-Wound Care Skin Prep Discharge Instruction: Use skin prep as directed Topical Gentamicin Discharge Instruction: thin layer to wound bed in clinic Primary Dressing Promogran Prisma Matrix, 4.34 (sq in) (silver collagen) Discharge Instruction: Moisten collagen with saline or hydrogel [name],  [name] ([id number] Page 7 of 7 VAC Secondary Dressing Zetuvit Plus 4x8 in Discharge Instruction: Apply over primary dressing as directed. Secured With Elastic Bandage 4 inch (ACE bandage) Discharge Instruction: Secure with ACE bandage as directed. 91M Medipore Soft Cloth Surgical T 2x10 (in/yd) ape Discharge Instruction: Secure with tape as directed. Compression Wrap Kerlix Roll 4.5x3.1 (in/yd) Discharge Instruction: Apply Kerlix and Coban compression as directed. Compression Stockings Add-Ons Electronic Signature(s) Signed: 06/15/2022 11:58:58 AM By: Baruch Gouty RN, BSN Entered By: Baruch Gouty on 06/15/2022 08:31:15 -------------------------------------------------------------------------------- Vitals Details Patient Name: Date of Service: [name], [name] 06/15/2022 8:15 A M Medical Record Number: [id number] Patient Account Number: [id number] Date of Birth/Sex: Treating RN: 10-14-1985 (38 y.o. [name] Primary Care Ladarion Munyon: [name] Other Clinician: Referring Osby Sweetin: Treating [name]/Extender: [name] in Treatment: 137 Vital Signs Time Taken: 08:18 Temperature (F): 97.9 Height (in): 77 Pulse (bpm): 97 Weight (lbs): 280 Respiratory Rate (breaths/min): 18 Body Mass Index (BMI): 33.2 Blood Pressure (mmHg): 138/94 Capillary Blood Glucose (mg/dl): 87 Reference Range: 80 - 120 mg / dl Notes glucose per pt report this am Electronic Signature(s) Signed:  06/15/2022 11:58:58 AM By: Baruch Gouty RN, BSN Entered By: Baruch Gouty on 06/15/2022 08:19:01

## 2022-06-15 NOTE — Progress Notes (Signed)
[name], Max ([id number]) [id number] Page 1 of 20 Visit Report for 06/15/2022 Chief Complaint Document Details Patient Name: Date of Service: [name] 06/15/2022 8:15 A M Medical Record Number: [id number] Patient Account Number: [id number] Date of Birth/Sex: Treating RN: 1985/08/22 (37 y.o. [name] Primary Care Provider: [name] Other Clinician: Referring Provider: Treating Provider/Extender: [name] in Treatment: [place] from: Patient Chief Complaint 01/11/2019; patient is here for review of a rather substantial wound over the left fifth plantar metatarsal head extending into the lateral part of his foot 10/24/2019; patient returns to clinic with wounds on his bilateral feet with underlying osteomyelitis biopsy-proven Electronic Signature(s) Signed: 06/15/2022 11:58:37 AM By: [name] DO Entered By: [name] on 06/15/2022 08:53:42 -------------------------------------------------------------------------------- HPI Details Patient Name: Date of Service: [name], [name] 06/15/2022 8:15 A M Medical Record Number: [id number] Patient Account Number: [id number] Date of Birth/Sex: Treating RN: 06-22-1985 (37 y.o. 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 He comes in for review of a wound over the plantar fifth metatarsal head extending into the lateral part of the foot. He was followed for this previously by his podiatrist Dr. Cornelius Moras. As the patient tells his story he went to see podiatry first for a swelling he developed on the lateral part of his fifth metatarsal head in May. He states this  was "open" by podiatry and the area closed. He was followed up in June and it was again opened callus removed and it closed promptly. There were plans being made for surgery on the fifth metatarsal head in June however his blood sugar was apparently too high for anesthesia. Apparently the area was debrided and opened again in June and it is never closed since. Looking over the records from podiatry I am really not able to follow this. It was clear when he was first seen it was before 5/14 at that point he already had a wound. By 5/17 the ulcer was resolved. I do not see anything about a procedure. On 5/28 noted to have pre-ulcerative moderate keratosis. X-ray noted 1/5 contracted toe and tailor's bunion and metatarsal deformity. On a visit date on 09/28/2018 the dorsal part of the left foot it healed and resolved. There was concern about swelling in his lower extremity he was sent to the ER.. As far as I can tell he was seen in the ER on 7/12 with an ulcer on his left foot. A DVT rule out of the left leg was negative. I do not think I have complete records from podiatry but I am not able to verify the procedures this patient states he had. He states after the last procedure the wound has never closed although I am not able to follow this in the records I have from podiatry. He has not had a recent x-ray The patient has been using Neosporin on the wound. He is wearing a Darco shoe. He is still very active up on his foot working and exercising. Past medical history; type 2 diabetes ketosis-prone, leg swelling with a negative DVT study in July. Non-smoker ABI in our clinic was 0.85 on the left 10/16; substantial wound on the plantar left fifth met head extending laterally almost to the dorsal fifth MTP. We have  been using silver alginate we gave him a Darco forefoot off loader. An x-ray did not show evidence of osteomyelitis did note soft tissue emphysema which I think was due to gas tracking through  an open wound. There is no doubt in my mind he requires an MRI 10/23; MRI not booked until 3 November at the earliest this is largely due to his glucose sensor in the right arm. We have been using silver alginate. There has been an improvement 10/29; I am still not exactly sure when his MRI is booked for. He says it is the third but it is the 10th in epic. This definitely needs to be done. He is running a low-grade fever today but no other symptoms. No real improvement in the 1 02/26/2019 patient presents today for a follow-up visit here in our clinic he is last been seen in the clinic on October 29. Subsequently we were working on getting MRI to evaluate and see what exactly was going on and where we would need to go from the standpoint of whether or not he had osteomyelitis and again what treatments were going be required. Subsequently the patient ended up being admitted to the hospital on 02/07/2019 and was discharged on 02/14/2019. This is a somewhat interesting admission with a discharge diagnosis of pneumonia due to COVID-19 although he was positive for COVID-19 when tested at the urgent care but negative x2 when he was actually in the hospital. With that being said he did have acute respiratory failure with hypoxia and it was noted he also have a left foot ulceration with osteomyelitis. With that being said he did require oxygen for his pneumonia and I level 4 L. He was placed on antivirals and steroids for the COVID-19. He was also transferred to the [facility] at one point. Nonetheless he did subsequently discharged home and since being home has done much better in that regard. The CT angiogram did not show any pulmonary embolism. With regard to the osteomyelitis the patient was placed on vancomycin and Zosyn while in the hospital but has been changed to Augmentin at discharge. It was also recommended that he follow- up with wound care and podiatry. Podiatry however wanted him to see  [name] according to the patient prior to them doing anything further. His hemoglobin A1c was 9.9 as noted in the hospital. Have an MRI of the left foot performed while in the hospital on 02/04/2019. This showed evidence of septic arthritis at the fifth MTP [name], Max ([id number]) [id number] Page 2 of 20 joint and osteomyelitis involving the fifth metatarsal head and proximal phalanx. There is an overlying plantar open wound noted an abscess tracking back along the lateral aspect of the fifth metatarsal shaft. There is otherwise diffuse cellulitis and mild fasciitis without findings of polymyositis. The patient did have recently pneumonia secondary to COVID-19 I looked in the chart through epic and it does appear that the patient may need to have an additional x-ray just to ensure everything is cleared and that he has no airspace disease prior to putting him into the [name]. 03/05/2019; patient was readmitted to the clinic last week. He was hospitalized twice for a viral upper respiratory tract infection from 11/1 through 11/4 and then 11/5 through 11/12 ultimately this turned out to be Covid pneumonitis. Although he was discharged on oxygen he is not using it. He says he feels fine. He has no exercise limitation no cough no sputum. His O2 sat in our clinic today was  100% on room air. He did manage to have his MRI which showed septic arthritis at the fifth MTP joint and osteomyelitis involving the fifth metatarsal head and proximal phalanx. He received Vanco and Zosyn in the hospital and then was discharged on 2 weeks of Augmentin. I do not see any relevant cultures. He was supposed to follow-up with infectious disease but I do not see that he has an appointment. 12/8; patient saw Dr. Novella Olive of infectious disease last week. He felt that he had had adequate antibiotic therapy. He did not go to follow-up with Dr. Amalia Hailey of podiatry and I have again talked to him about the pros  and cons of this. He does not want to consider a ray amputation of this time. He is aware of the risks of recurrence, migration etc. He started HBO today and tolerated this well. He can complete the Augmentin that I gave him last week. I have looked over the lab work that Dr. Chana Bode ordered his C-reactive protein was 3.3 and his sedimentation rate was 17. The C-reactive protein is never really been measurably that high in this patient 12/15; not much change in the wound today however he has undermining along the lateral part of the foot again more extensively than last week. He has some rims of epithelialization. We have been using silver alginate. He is undergoing hyperbarics but did not dive today 12/18; in for his obligatory first total contact cast change. Unfortunately there was pus coming from the undermining area around his fifth metatarsal head. This was cultured but will preclude reapplication of a cast. He is seen in conjunction with HBO 12/24; patient had staph lugdunensis in the wound in the undermining area laterally last time. We put him on doxycycline which should have covered this. The wound looks better today. I am going to give him another week of doxycycline before reattempting the total contact cast 12/31; the patient is completing antibiotics. Hemorrhagic debris in the distal part of the wound with some undermining distally. He also had hyper granulation. Extensive debridement with a #5 curette. The infected area that was on the lateral part of the fifth met head is closed over. I do not think he needs any more antibiotics. Patient was seen prior to HBO. Preparations for a total contact cast were made in the cast will be placed post hyperbarics 04/11/19; once again the patient arrives today without complaint. He had been in a cast all week noted that he had heavy drainage this week. This resulted in large raised areas of macerated tissue around the wound 1/14; wound bed looks  better slightly smaller. Hydrofera Blue has been changing himself. He had a heavy drainage last week which caused a lot of maceration around the wound so I took him out of a total contact cast he says the drainage is actually better this week He is seen today in conjunction with HBO 1/21; returns to clinic. He was up in [place] for a day or 2 attending a funeral. He comes back in with the wound larger and with a large area of exposed bone. He had osteomyelitis and septic arthritis of the fifth left metatarsal head while he was in hospital. He received IV antibiotics in the hospital for a prolonged period of time then 3 weeks of Augmentin. Subsequently I gave him 2 weeks of doxycycline for more superficial wound infection. When I saw this last week the wound was smaller the surface of the wound looks satisfactory. 1/28; patient missed hyperbarics  today. Bone biopsy I did last time showed Enterococcus faecalis and Staphylococcus lugdunensis . He has a wide area of exposed bone. We are going to use silver alginate as of today. I had another ethical discussion with the patient. This would be recurrent osteomyelitis he is already received IV antibiotics. In this situation I think the likelihood of healing this is low. Therefore I have recommended a ray amputation and with the patient's agreement I have referred him to Dr. Doran Durand. The other issue is that his compliance with hyperbarics has been minimal because of his work schedule and given his underlying decision I am going to stop this today READMISSION 10/24/2019 MRI 09/29/2019 left foot IMPRESSION: 1. Apparent skin ulceration inferior and lateral to the 5th metatarsal base with underlying heterogeneous T2 signal and enhancement in the subcutaneous fat. Small peripherally enhancing fluid collections along the plantar and lateral aspects of the 5th metatarsal base suspicious for abscesses. 2. Interval amputation through the mid 5th metatarsal  with nonspecific low-level marrow edema and enhancement. Given the proximity to the adjacent soft tissue inflammatory changes, osteomyelitis cannot be excluded. 3. The additional bones appear unremarkable. MRI 09/29/2019 right foot IMPRESSION: 1. Soft tissue ulceration lateral to the 5th MTP joint. There is low-level T2 hyperintensity within the 4th and 5th metatarsal heads and adjacent proximal phalanges without abnormal T1 signal or cortical destruction. These findings are nonspecific and could be seen with early marrow edema, hyperemia or early osteomyelitis. No evidence of septic joint. 2. Mild tenosynovitis and synovial enhancement associated with the extensor digitorum tendons at the level of the midfoot. 3. Diffuse low-level muscular T2 hyperintensity and enhancement, most consistent with diabetic myopathy. LEFT FOOT BONE Methicillin resistant staphylococcus aureus Staphylococcus lugdunensis MIC MIC CIPROFLOXACIN >=8 RESISTANT Resistant <=0.5 SENSI... Sensitive CLINDAMYCIN <=0.25 SENS... Sensitive >=8 RESISTANT Resistant ERYTHROMYCIN >=8 RESISTANT Resistant >=8 RESISTANT Resistant GENTAMICIN <=0.5 SENSI... Sensitive <=0.5 SENSI... Sensitive Inducible Clindamycin NEGATIVE Sensitive NEGATIVE Sensitive OXACILLIN >=4 RESISTANT Resistant 2 SENSITIVE Sensitive RIFAMPIN <=0.5 SENSI... Sensitive <=0.5 SENSI... Sensitive TETRACYCLINE <=1 SENSITIVE Sensitive <=1 SENSITIVE Sensitive TRIMETH/SULFA <=10 SENSIT Sensitive <=10 SENSIT Sensitive ... Marland Kitchen.. VANCOMYCIN 1 SENSITIVE Sensitive <=0.5 SENSI... Sensitive Right foot bone Glasper, Max ([id number]) [id number] Page 3 of 20 . Component 3 wk ago Specimen Description BONE Special Requests RIGHT 4 METATARSAL SAMPLE B Gram Stain NO WBC SEEN NO ORGANISMS SEEN Culture RARE METHICILLIN RESISTANT STAPHYLOCOCCUS AUREUS NO ANAEROBES ISOLATED Performed at [facility], [place] [address] [place], [place]  [address] Report Status 10/08/2019 FINAL Organism ID, Bacteria METHICILLIN RESISTANT STAPHYLOCOCCUS AUREUS Resulting Agency [facility] Susceptibility Methicillin resistant staphylococcus aureus MIC CIPROFLOXACIN >=8 RESISTANT Resistant CLINDAMYCIN <=0.25 SENS... Sensitive ERYTHROMYCIN >=8 RESISTANT Resistant GENTAMICIN <=0.5 SENSI... Sensitive Inducible Clindamycin NEGATIVE Sensitive OXACILLIN >=4 RESISTANT Resistant RIFAMPIN <=0.5 SENSI... Sensitive TETRACYCLINE <=1 SENSITIVE Sensitive TRIMETH/SULFA <=10 SENSIT Sensitive ... VANCOMYCIN 1 SENSITIVE Sensitive This is a patient we had in clinic earlier this year with a wound over his left fifth metatarsal head. 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 Doran Durand on 05/16/2019. He seems to have developed problems with wounds on his bilateral feet in June 2021 although he may have had problems earlier than this. He was in an urgent care with a right foot ulcer on 09/26/2019 and  given a course of doxycycline. This was apparently after having trouble getting into see orthopedics. He was seen by podiatry on 09/28/2019 noted to have bilateral lower extremity ulcers including the left lateral fifth metatarsal base and the right subfifth met head. It was noted that had purulent drainage at that time. He required hospitalization from 6/20 through 7/2. This was because of worsening right foot wounds. He underwent bilateral operative incision and drainage and bone biopsies bilaterally. Culture results are listed above. He has been referred back to clinic by Dr. Jacqualyn Posey of podiatry. He is also followed by Dr. Megan Salon who saw him yesterday. He was discharged from hospital on Zyvox  Flagyl and Levaquin and yesterday changed to doxycycline Flagyl and Levaquin. His inflammatory markers on 6/26 showed a sedimentation rate of 129 and a C-reactive protein of 5. This is improved to 14 and 1.3 respectively. This would indicate improvement. ABIs in our clinic today were 1.23 on the right and 1.20 on the left 11/01/2019 on evaluation today patient appears to be doing fairly well in regard to the wounds on his feet at this point. Fortunately there is no signs of active infection at this time. No fevers, chills, nausea, vomiting, or diarrhea. He currently is seeing infectious disease and still under their care at this point. Subsequently he also has both wounds which she has not been using collagen on as he did not receive that in his packaging he did not call us and let us know that. Apparently that just was missed on the order. Nonetheless we will get that straightened out today. 8/9-Patient returns for bilateral foot wounds, using Prisma with hydrogel moistened dressings, and the wounds appear stable. Patient using surgical shoes, avoiding much pressure or weightbearing as much as possible 8/16; patient has bilateral foot wounds. 1 on the right lateral foot proximally the other is on the left mid lateral foot. Both required debridement of callus and thick skin around the wounds. We have been using silver collagen 8/27; patient has bilateral lateral foot wounds. The area on the left substantially surrounded by callus and dry skin. This was removed from the wound edge. The underlying wound is small. The area on the right measured somewhat smaller today. 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 Unfortunately I did not update his antibiotics during today's visit. 9/10 I reviewed Dr. Hale Bogus last notes he felt he had completed antibiotics his inflammatory markers were reasonably well controlled. He has a small wound on  the lateral left foot and a tiny area on the right which is just above closed. He is using Hydrofera Blue with border foam he has bilateral surgical shoes 9/24; 2 week f/u. doing well. right foot is closed. left foot still undermined. 10/14; right foot remains closed at the fifth met head. The area over the base of the left fifth metatarsal has a small open area but considerable undermining towards the plantar foot. Thick callus skin around this suggests an adequate pressure relief. We have talked about this. He says he is going to go back into his cam boot. I suggested a total contact cast he did not seem enamored with this suggestion 10/26; left foot base of the fifth metatarsal. Same condition as last time. He has skin over the area with an open wound however the skin is not adherent. He went to see Dr. Earleen Newport who did an x-ray and culture of his foot I have not reviewed the  x-ray but the patient was not told anything. He is on doxycycline 11/11; since the patient was last here he was in the emergency room on 10/30 he was concerned about swelling in the left foot. They did not do any cultures or x-rays. They changed his antibiotics to cephalexin. Previous culture showed group B strep. The cephalexin is appropriate as doxycycline has less than predictable coverage. Arrives in clinic today with swelling over this area under the wound. He also has a new wound on the right fifth metatarsal head 11/18; the patient has a difficult wound on the lateral aspect of the left fifth metatarsal head. The wound was almost ballotable last week I opened it slightly expecting to see purulence however there was just bleeding. I cultured this this was negative. X-ray unchanged. We are trying to get an MRI but I am not sure were going to be able to get this through his insurance. He also has an area on the right lateral fifth metatarsal head this looks healthier 12/3; the patient finally got our MRI. Surprisingly this did  not show osteomyelitis. I did show the soft tissue ulceration at the lateral plantar aspect of the fifth metatarsal base with a tiny residual 6 mm abscess overlying the superficial fascia I have tried to culture this area I have not been able to get this to grow anything. Nevertheless the protruding tissue looks aggravated. I suspect we should try to treat the underlying "abscess with broad-spectrum antibiotics. I am going to start him on Levaquin and Flagyl. He has much less edema in his legs and I am going to continue to wrap his legs and see him weekly 12/10. I started Levaquin and Flagyl on him last week. He just picked up the Flagyl apparently there was some delay. The worry is the wound on the left fifth metatarsal base which is substantial and worsening. His foot looks like he inverts at the ankle making this a weightbearing surface. Certainly no improvement in fact I think the measurements of this are somewhat worse. We have been using 12/17; he apparently just got the Levaquin yesterday this is 2 weeks after the fact. He has completed the Flagyl. The area over the left fifth metatarsal base still has protruding granulation tissue although it does not look quite as bad as it did some weeks ago. He has severe bilateral lymphedema although we have not been treating him for wounds on his legs this is definitely going to require compression. There was so much edema in the left I did not wish to put him in a total contact cast today. I am going to increase his compression from 3-4 layer. The area on the right lateral fifth met head actually look quite good and superficial. 12/23; patient arrived with callus on the right fifth met head and the substantial hyper granulated callused wound on the base of his fifth metatarsal. He says he is completing his Levaquin in 2 days but I do not think that adds up with what I gave him but I will have to double check this. We are using Hydrofera Blue on both  areas. My plan is to put the left leg in a cast the week after New Year's 04/06/2020; patient's wounds about the same. Right lateral fifth metatarsal head and left lateral foot over the base of the fifth metatarsal. There is undermining on the left lateral foot which I removed before application of total contact cast continuing with Hydrofera Blue new. Patient tells me he  was seen by endocrinology today lab work was done [Dr. Kerr]. Also wondering whether he was referred to cardiology. I went over some lab work from previously does not have chronic renal failure certainly not nephrotic range proteinuria he does have very poorly controlled diabetes but this is not Tennyson, Max ([id number]) [id number] Page 4 of 20 his most updated lab work. Hemoglobin A1c has been over 11 1/10; the patient had a considerable amount of leakage towards mid part of his left foot with macerated skin however the wound surface looks better the area on the right lateral fifth met head is better as well. I am going to change the dressing on the left foot under the total contact cast to silver alginate, continue with Hydrofera Blue on the right. 1/20; patient was in the total contact cast for 10 days. Considerable amount of drainage although the skin around the wound does not look too bad on the left foot. The area on the right fifth metatarsal head is closed. Our nursing staff reports large amount of drainage out of the left lateral foot wound 1/25; continues with copious amounts of drainage described by our intake staff. PCR culture I did last week showed E. coli and Enterococcus faecalis and low quantities. Multiple resistance genes documented including extended spectrum beta lactamase, MRSA, MRSE, quinolone, tetracycline. The wound is not quite as good this week as it was 5 days ago but about the same size 2/3; continues with copious amounts of malodorous drainage per our intake nurse. The PCR  culture I did 2 weeks ago showed E. coli and low quantities of Enterococcus. There were multiple resistance genes detected. I put Neosporin on him last week although this does not seem to have helped. The wound is slightly deeper today. Offloading continues to be an issue here although with the amount of drainage she has a total contact cast is just not going to work 2/10; moderate amount of drainage. Patient reports he cannot get his stocking on over the dressing. I told him we have to do that the nurse gave him suggestions on how to make this work. The wound is on the bottom and lateral part of his left foot. Is cultured predominantly grew low amounts of Enterococcus, E. coli and anaerobes. There were multiple resistance genes detected including extended spectrum beta lactamase, quinolone, tetracycline. I could not think of an easy oral combination to address this so for now I am going to do topical antibiotics provided by [facility] I think the main agents here are vancomycin and an aminoglycoside. We have to be able to give him access to the wounds to get the topical antibiotic on 2/17; moderate amount of drainage this is unchanged. He has his [facility] topical antibiotic against the deep tissue culture organisms. He has been using this and changing the dressing daily. Silver alginate on the wound surface. 2/24; using [facility] antibiotic with silver alginate on the top. He had too much drainage for a total contact cast at one point although I think that is improving and I think in the next week or 2 it might be possible to replace a total contact cast I did not do this today. In general the wound surface looks healthy however he continues to have thick rims of skin and subcutaneous tissue around the wide area of the circumference which I debrided 06/04/2020 upon evaluation today patient appears to be doing well in regard to his wound. I do feel like he is showing signs of improvement. There  is little bit  of callus and dead tissue around the edges of the wound as well as what appears to be a little bit of a sinus tract that is off to the side laterally I would perform debridement to clear that away today. 3/17; left lateral foot. The wound looks about the same as I remember. Not much depth surface looks healthy. No evidence of infection 3/25; left lateral foot. Wound surface looks about the same. Separating epithelium from the circumference. There really is no evidence of infection here however not making progress by my view 3/29; left lateral foot. Surface of the wound again looks reasonably healthy still thick skin and subcutaneous tissue around the wound margins. There is no evidence of infection. One of the concerns being brought up by the nurses has again the amount of drainage vis--vis continued use of a total contact cast 4/5; left lateral foot at roughly the base of the fifth metatarsal. Nice healthy looking granulated tissue with rims of epithelialization. The overall wound measurements are not any better but the tissue looks healthy. The only concern is the amount of drainage although he has no surrounding maceration with what we have been doing recently to absorb fluid and protect his skin. He also has lymphedema. 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 He states that the kerlix/coban under the TCC rolled down and could not get it back up. He has been using an offloading scooter and has somehow hurt his right foot using this device. This happened last week. He states that the side of his right foot developed a blister and opened. The top of his foot also has a few small open wounds he thinks is due to his socks rubbing in his shoes. He  has not been using any dressings to the wound. He denies purulent drainage, fever/chills or erythema to the wounds. 4/22; patient presents for 1 week follow-up. He developed new wounds to the right foot that were evaluated at last clinic visit. He continues to have a total contact cast to the left leg and he reports no issues. He has been using silver collagen to the right foot wounds with no issues. He denies purulent drainage, fever/chills or erythema to the right foot wounds. He has no complaints today 4/25; patient presents for 1 week follow-up. He has a total contact cast of the left leg and reports no issues. He has been using silver alginate to the right foot wound. He denies purulent drainage, fever/chills or erythema to the right foot wounds. 5/2 patient presents for 1 week follow-up. T contact cast on the left. The wound which is on the base of the plantar foot at the base of the fifth metatarsal otal actually looks quite good and dimensions continue to gradually contract. HOWEVER the area on the right lateral fifth metatarsal head is much larger than what I remember from 2 weeks ago. Once more is he has significant levels of hypergranulation. Noteworthy that he had this same hyper granulated response on his wound on the left foot at one point in time. So much so that he I thought there was an underlying fluid collection. Based on this I think this just needs debridement. 5/9; the wound on the left  actually continues to be gradually smaller with a healthy surface. Slight amount of drainage and maceration of the skin around but not too bad. However he has a large wound over the right fifth metatarsal head very much in the same configuration as his left foot wound was initially. I used silver nitrate to address the hyper granulated tissue no mechanical debridement 5/16; area on the left foot did not look as healthy this week deeper thick surrounding macerated skin and subcutaneous  tissue. The area on the right foot fifth met head was about the same The area on the right ankle that we identified last week is completely broken down into an open wound presumably a stocking rubbing issue 5/23; patient has been using a total contact cast to the left side. He has been using silver alginate underneath. He has also been using silver alginate to the right foot wounds. He has no complaints today. He denies any signs of infection. 5/31; the left-sided wound looks some better measure smaller surface granulation looks better. We have been using silver alginate under the total contact cast The large area on his right fifth met head and right dorsal foot look about the same still using silver alginate 6/6; neither side is good as I was hoping although the surface area dimensions are better. A lot of maceration on his left and right foot around the wound edge. Area on the dorsal right foot looks better. He says he was traveling. I am not sure what does the amount of maceration around the plantar wounds may be drainage issues 6/13; in general the wound surfaces look quite good on both sides. Macerated skin and raised edges around the wound required debridement although in general especially on the left the surface area seems improved. The area on the right dorsal ankle is about the same I thought this would not be such a problem to close 6/20; not much change in either wound although the one on the right looks a little better. Both wounds have thick macerated edges to the skin requiring debridements. We have been using silver alginate. The area on the dorsal right ankle is still open I thought this would be closed. 6/28; patient comes in today with a marked deterioration in the right foot wound fifth met head. Wide area of exposed bone this is a drastic change from last time. The area on the left there we have been casting is stagnant. We have been using silver alginate in both wound  areas. 7/5; bone culture I did for PCR last time was positive for Pseudomonas, group B strep, Enterococcus and Staph aureus. There was no suggestion of methicillin resistance or ampicillin resistant genes. This was resistant to tetracycline however He comes into the clinic today with the area over his right plantar fifth metatarsal head which had been doing so well 2 weeks ago completely necrotic feeling bone. I do not know that this is going to be salvageable. The left foot wound is certainly no smaller but it has a better surface and is superficial. 7/8; patient called in this morning to say that his total contact cast was rubbing against his foot. He states he is doing fine overall. He denies signs of infection. 7/12; continued deterioration in the wound over the right fifth metatarsal head crumbling bone. This is not going to be salvageable. The patient agrees and wants to be referred to Dr. Doran Durand which we will attempt to arrange as soon as possible. I am going to continue  him on antibiotics as long as that takes so I will renew those today. [name], Max ([id number]) [id number] Page 5 of 20 The area on the left foot which is the base of the fifth metatarsal continues to look somewhat better. Healthy looking tissue no depth no debridement is necessary here. 7/20; the patient was kindly seen by Dr. Doran Durand of orthopedics on 10/19/2020. 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 Towards this end we have taken him out of the total contact cast on the left we will put him in a wrap with Hydrofera Blue. As I understand things surgery is planned for 7/21 7/27; patient had his surgery last Thursday. 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 Apparently everything went well we did not still disturb that today The area on the left foot actually looks quite good. He has been much less  mobile which probably explains this he did not seem to do well in the total contact cast secondary to drainage and maceration I think. 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 Fortunately there is no evidence of active infection at this time. No fevers, chills, nausea, vomiting, or diarrhea. Overall I think that he is actually doing extremely well. Nonetheless I do believe that he is staying off of this more following the surgery in his right foot that is the reason the left is doing so great. 8/16; left plantar foot wound. This looks smaller than the last time I saw this he is using Hydrofera Blue. The surgical wound on the right foot is being followed by Dr. Doran Durand we did not look at this today. He has surgical shoes on both feet 8/23; left plantar foot wound not as good this week. Surrounding macerated skin and subcutaneous tissue everything looks moist and wet. I do not think he is offloading this adequately. He is using a surgical shoe Apparently the right foot surgical wound is not open although I did not check his foot 8/31; left plantar foot lateral aspect. Much improved this week. He has no maceration. Some improvement in the surface area of the wound but most impressively the depth is come in we are using silver alginate. The patient is a Product/process development scientist. He is asked that we write him a letter so he can go back to work. I have also tried to see if we can write something that will allow him to limit the amount of time that he is on his foot at work. 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 Hopefully the latter can be avoided 9/6; I believe the patient missed an appointment last week. He arrives in today with a wound looking roughly the same certainly no better. Undermining laterally and also  inferiorly. We used molecuLight today in training with the patient's permission.. We are using silver alginate 9/21 wound is measuring bigger this week although this may have to do with the aggressive circumferential debridement last week in response to the blush fluorescence on the MolecuLight. Culture I did last week showed significant MSSA and E. coli. I put him on Augmentin but he has not started it yet. We are also going to send this for compounded antibiotics at  [facility]. There is no evidence of systemic infection 9/29; silver alginate. His [facility] arrived. He is completing Augmentin in 2 days. Offloading in a cam boot. Moderate drainage per our intake staff 10/5; using silver alginate. He has been using his [place]. He has completed his Augmentin. Per our intake nurse still a lot of drainage, far too much to consider a total contact cast. Wound measures about the same. He had the same undermining area that I defined last week from a roughly 11-3. I remove this today 10/12; using silver alginate he is using the [place]. He comes in for a nurse visit hence we are applying [employer] twice a week. Measuring slightly better today and less notable drainage. Extensive debridement of the wound edge last time 10/18; using topical [facility] and silver alginate and a soft cast. Wound measurements about the same. Drainage was through his soft cast. We are changing this twice a week Tuesdays and Friday 10/25; comes in with moderate drainage. Still using [facility] silver alginate and a soft cast. Wound dimensions completely the same.He has a lot of edema in the left leg he has lymphedema. Asking for [name] to consider wrapping him as he cannot get his stocking on over the soft cast 11/2; comes in with moderate to large drainage slightly smaller in terms of width we have been using [place]. His wound looks satisfactory but not much improvement 11/4; patient presents today for obligatory cast change. Has no  issues or complaints today. He denies signs of infection. 11/9; patient traveled this weekend to [place], was on the cast quite a bit. Staining of the cast with black material from his walking boot. Drainage was not quite as bad as we feared. Using silver alginate and [facility] 11/16; we do not have size for cast therefore we have been putting a soft cast on him since the change on Friday. Still a significant amount of drainage necessitating changing twice a week. We have been using the [facility] at cast changes either hard or soft as well as silver alginate Comes in the clinic with things actually looking fairly good improvement in width. He says his offloading is about the same 02/24/2021 upon evaluation today patient actually comes back in and is doing excellent in regard to his foot ulcer this is significantly smaller even compared to the last visit. The soft cast seems to have done extremely well for him which is great news. I do not see any signs of infection minimal debridement will be needed today. 11/30; left lateral foot much improved half a centimeter improvement in surface area. No evidence of infection. He seems to be doing better with the soft cast in the TCC therefore we will continue with this. He comes back in later in the week for a change with the nurses. This is due to drainage 12/6; no improvement in dimensions. Under illumination some debris on the surface we have been using silver alginate, soft cast. If there is anything optimistic here he seems to have have less drainage 12/13. Dimensions are improved both length and width and slightly in depth. Appears to be quite healthy today. Raised edges of this thick skin and callus around the edges however. He is in a soft cast were bringing him back once for a change on Friday. Drainage is better 12/20. Dimensions are improved. He still has raised edges of thick skin and callus around the edges. We are using a soft cast 12/28; comes in today  with thick callus around the wound. Using  silver under alginate under a soft cast. I do not think there is much improvement in any measurement 2023 04/06/2021; patient was put in a total contact cast. Unfortunately not much change in surface area 1/10; not much different still thick callus and skin around the edge in spite of the total contact cast. This was just debrided last week we have been using the [facility] compounded antibiotic and silver alginate under a total contact cast 1/18 the patient's wound on the left side is doing nicely. smaller HOWEVER he comes in today with a wound on the right foot laterally. blister most likely serosangquenous drainage 1/24; the patient continues to do well in terms of the plantar left foot which is continued to contract using silver alginate under the total contact cast HOWEVER the right lateral foot is bigger with denuded skin around the edges. I used pickups and a #15 scalpel to remove this this looks like the remanence of a large blister. Cannot rule out infection. Culture in this area I did last week showed Staphylococcus lugdunensis few colonies. I am going to try to address this with his [employer] antibiotic that is done so well on the left having linezolid and this should cover the staph 2/1; the patient's wound on his left foot which was the original plantar foot wound thick skin and eschar around the edges even in the total contact cast but the wound surface does not look too bad The real problem is on how his right lateral foot at roughly the base of the fifth metatarsal. The wound is completely necrotic more worrisome than that there is swelling around the edges of this. We have been using silver alginate on both wounds and [facility] on the right foot. Unfortunately I think he is going to require systemic antibiotics while we await cultures. He did not get the x-ray done that we ordered last week [lost the prescription 2/7; disappointingly in the area  on the left foot which we are treating with a total contact cast is still not closed although it is much smaller. He continues to [name], Max ([id number]) [id number] Page 6 of 20 have a lot of callus around the wound edge. -Right lateral foot culture I did last week was negative x-ray also negative for osteomyelitis. 2/15: TCC silver alginate on the left and silver alginate on the right lateral. No real improvement in either area 05/26/2021: T oday, the wounds are roughly the same size as at his previous visit, post-debridement. He continues to endorse fairly substantial drainage, particularly on the right. He has been in a total contact cast on the left. There is still some callus surrounding this lesion. On the right, the periwound skin is quite macerated, along with surrounding callus. The center of the right-sided wound also has some dark, densely adherent material, which is very difficult to remove. 06/02/2021: Today, both wounds are slightly smaller. He has been using zinc oxide ointment around the right ulcer and the degree of maceration has improved markedly. There continues to be an area of nonviable tissue in the center of the right sided ulcer. The left-sided wound, which has been in the total contact cast. Appears clean and the degree of callus around it is less than previously. 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 He had to take the stairs and both wounds have increased in size. The left foot, which has been in a total contact cast, has developed a tunnel tracking  to the lateral aspect of his foot. The nonviable tissue in the center of the right-sided ulcer remains recalcitrant to debridement. There is significant undermining surrounding the entirety of the left sided wound. 06/16/2021: The elevator at school has been fixed and the patient has been able to avoid putting as much weight on his  wounds over the past week. We converted the left foot wound into a single lesion today, but despite this, the wound is actually smaller. The base is healthy with limited periwound callus. On the right, the central necrotic area is still present. He continues to be quite macerated around the right sided wound, despite applying barrier cream. This does, however, have the benefit of softening the callus to make it more easily removable. 06/23/2021: Today, the left wound is smaller. The lateral aspect that had opened up previously is now closed. The wound base has a healthy bed of granulation tissue and minimal slough. Unfortunately, on the right, the wound is larger and continues to be fairly macerated. He has also reopened the wound at his right ankle. He thinks this is due to the gait he has adopted secondary to his total contact cast and boot. 06/30/2021: T oday, both wounds are a little bit larger. The lateral aspect on the left has remained closed. He continues to have significant periwound maceration. The culture that I took from the right sided wound grew a population of bacteria that is not covered by his current [facility] antibiotic. The center of the right- sided wound continues to appear necrotic with nonviable fat. It probes deeper today, but does not reach bone. 07/07/2021: The periwound maceration is a little bit less today. The right lateral foot wound has some areas that appear more viable and the necrotic center also looks a little bit better. The wound on the dorsal surface of his right foot near the ankle is contracting and the surface appears healthy. The left plantar wound surface looks healthy, but there is some new undermining on the medial portion. He did get his new [facility] antibiotic and began applying that to the right foot wound on Saturday. 07/14/2021: The intake nurse reported substantial drainage from his wounds, but the periwound skin actually looks better than is typical for  him. The wound on the dorsal surface of his right foot near the ankle is smaller and just has a small open area underneath some dried eschar. The left plantar wound surface looks healthy and there has been no significant accumulation of callus. The right lateral foot wound looks quite a bit better, with the central portion, which typically appears necrotic, looking more viable albeit pale. 07/22/2021: His left foot is extremely macerated today. The wound is about the same size. The wound on the dorsal surface of his right foot near the ankle had closed, but he traumatized it removing the dressing and there is a tiny skin tear in that location. The right lateral foot wound is bigger, but the surface appears healthy. 07/30/2021: The wound on the dorsal surface of his right foot near the ankle is closed. The right lateral foot wound again is a little bit bigger due to some undermining. The periwound skin is in better condition, however. He has been applying zinc oxide. The wound surface is a little bit dry today. On the left, he does not have the substantial maceration that we frequently see. The wound itself is smaller and has a clean surface. 08/06/2021: Both wounds seem to have deteriorated over the past week. The  right lateral foot wound has a dry surface but the periwound is boggy.. Overall wound dimensions are about the same. On the left, the wound is about the same size, but there is more undermining present underneath periwound callus. 08/13/2021: The right sided wound looks about the same, but on the left there has been substantial deterioration. The undermining continues to extend under periwound callus. Once this was removed, substantial extension of the wound was present. There is no odor or purulent drainage but clearly the wounds have broken down. 08/20/2021: The wounds look about the same today. He has been out of his total contact cast and has just been changing the dressings using topical  [facility] with PolyMem Ag, Kerlix and Ace bandages. The wound on the top of his right ankle has reopened but this is quite small. There was a little bit of purulent material that I expressed when examining this wound. 08/24/2021: After the aggressive debridement I performed at his last visit, the wounds actually look a little bit better today. They are smaller with the exception of the wound on the top of his right ankle which is a little bit bigger as some more skin pulled off when he was changing his dressing. We are using topical [facility] with PolyMem Ag Kerlix and Ace bandages. 09/02/2021: There has been really no change to any of his wounds. 09/16/2021: The patient was hospitalized last week with nausea, vomiting, and dehydration. He says that while he was in the hospital, his wounds were not really addressed properly. T oday, both plantar foot wounds are larger and the periwound skin is macerated. The wound on the dorsum of his right foot has a scab on the top. The right foot now has a crater where previously he had had nonviable fat. It looks as though this simply died and fell out. The periwound callus is wet. 09/24/2021: His wounds have deteriorated somewhat since his last visit. The wound on the dorsum of his right foot near his ankle is larger and has more nonviable tissue present. The crater in his right foot is even deeper; I cannot quite palpate or probe to bone but I am sure it is close. The wound on his left plantar foot has an odd boggy area in the center that almost feels as though it has fluid within it. He has run out of his topical Keystone antibiotic. We are using silver alginate on his wounds. 09/29/2021: He has developed a new wound on the dorsum of his left foot near his ankle. He says he thinks his wrapping is rubbing in that site. I would concur with this as the wound on his right ankle is larger. The left foot looks about the same. The right foot has the crater that was present  last week. No significant slough accumulation, but his foot remains quite swollen and warm despite oral antibiotic therapy. 10/08/2021: All of his wounds look about the same as last week. He did not start his oral antibiotics that are prescribed until just a couple of days ago; his [employer] compounded antibiotics formula has been changed and he is awaiting delivery of the new recipe. His MRI that was scheduled for earlier this week was canceled as no prior authorization had been obtained; unfortunately the tech responsible sent an email to my old  email, which I no longer use nor have access to. 10/18/2021: The wounds on his bilateral dorsal feet near the ankles are both improved. They are smaller and have just some  eschar and slough buildup. The left plantar wound has a fair amount of undermining, but the surface is clean. There is some periwound callus accumulation. On the right plantar foot, there is nonviable fat leading to a deep tunnel that tracks towards his dorsal medial foot. There is periwound callus and slough accumulation, as well. His right foot and leg remain swollen as compared to the left. 10/25/2021: The wounds on his bilateral dorsal feet and at the ankles have broken down somewhat. They are little bit larger than last week. The left plantar wound continues to undermine laterally but the surface is clean. The right plantar foot wound shows some decreased depth in the tunnel tracking towards his dorsal medial foot. He has not yet had the Doppler study that I ordered; it sounds like there is some confusion about the scheduling of the procedure. In addition, the MRI was denied and I have taken steps to appeal the denial. [name], Max ([id number]) [id number] Page 7 of 20 11/24/2021: Since our last visit, Mr. Elza was admitted to the hospital where an MRI suggested osteomyelitis. He was taken the operating room by podiatry. Bone biopsies were  negative for osteomyelitis. They debrided his wounds and applied myriad matrix. He saw them last week and they removed his staples. He is here today to continue his wound healing process. T oday, both of the dorsal foot/ankle wounds are substantially smaller. There is just a little eschar overlying the left sided wound and some eschar and slough on the right. The right plantar foot ulcer has the healthiest surface of granulation tissue that I have seen to date. A portion of the myriad matrix failed to take and was hanging loose. It appears that myriad morcells were placed into the tunnel closest to the dorsal portion of his foot. These have sloughed off. The left plantar foot ulcer is about the same size, but has a much healthier surface than in the past. Both plantar ulcers have callus and slough accumulation. 12/07/2021: Left dorsal foot/ankle wound is closed. The right dorsal foot/ankle wound is nearly closed and just has a small open area with some eschar and slough. The right plantar foot wound has contracted quite a bit since our last visit. It has a healthy surface with just a little bit of slough accumulation and periwound callus buildup. The left plantar foot wound is about the same size but the surface appears healthy. There is a little slough and periwound callus on this side, as well. 12/15/2021: Both dorsal foot/ankle wounds are closed. The right plantar foot wound is substantially smaller than at our last visit. The tunneling that was present has nearly closed. There is just a little bit of slough buildup. The left plantar foot wound is also a little bit smaller today. The surface is the healthiest that I have ever seen it. Light slough and periwound callus accumulation on this side. 12/22/2021: The dorsal ankle/foot wounds remain closed. The right plantar foot wound continues to contract. There is still a bit of depth at the lateral portion of the wound but the surface has a good granulated  appearance. The wound on the left is about the same size, the central indented portion is still adherent. It also is clean with good granulation tissue present. The periwound skin and callus are a bit macerated, however. 12/29/2021: Both ankle wounds remain closed. The right plantar foot wound is less than half the size that it was last week. There is no depth any longer and the surface  has just a little bit of slough and periwound callus. On the left, the wound is not much smaller, but the surface is healthier with good granulation tissue. There is also a little slough and periwound callus buildup. 01/05/2022: The right plantar foot wound continues to contract and is once again about half the size as it was last week. There is just a little bit of surface slough and periwound callus. On the left, the depth of the wound has decreased and the diameter has started to contract. It is also very clean with just a little slough and biofilm present. 01/12/2022: The right plantar foot wound is smaller again today. There is just some periwound callus and slough accumulation. On the left, there is a fair amount of undermining and the surface is a little boggy, but no other significant change. 01/19/2022: The right plantar wound continues to contract. There is minimal periwound callus accumulation and just a light layer of slough on the surface. On the left, the surface looks better, but there is fairly significant undermining near the 11:00 portion of the wound, aiming towards his great toe. The skin overlying this area is very healthy and has a good fat layer present. No malodor or purulent drainage. 01/26/2022: The right sided wound continues to contract and has just a light layer of slough on the surface. Minimal periwound callus. On the left, he still has substantial undermining and the tissue surface is not as robust as I would like to see. No malodor or purulent drainage, but he did say he had a lot of  serous drainage over the weekend. 02/02/2022: The right foot wound is about a quarter of the size that it was at his last visit. There is just a little slough on the surface and some periwound callus. On the left, the undermining persists and the tissue is still more pale, but he does not seem to have had as much drainage over the last weekend. We are waiting on a wound VAC. 02/09/2022: His right foot has healed. He received the wound VAC, but did not bring it to clinic with him today. The lateral aspect of the left foot wound has come in a little, but he still has substantial undermining on the medial and distal portion of the wound. Still with macerated periwound callus. 02/16/2022: The wound VAC was applied last Friday. T oday, there has been tremendous improvement in the surface of the wound bed. The undermined portion of the wound has come in a bit. There is still some overhanging dead skin. Overall the wound looks much better. 02/23/2022: No significant change in the overall wound dimensions, but the wound surface continues to improve and appears healthier and more robust. There is some periwound callus accumulation and overhanging skin. No concern for infection. 03/02/2022: Although the measurements taken in clinic today were unchanged, the visual appearance of the wound looks like it is smaller and the undermining/tunneling is filling with flesh. There is less periwound tissue maceration than usual. No malodor or purulent drainage. No concern for infection. 03/09/2022: The undermining has come in by couple of millimeters. The periwound is a little bit more macerated this week. No concern for infection. 12/13; substantial wound on the left plantar foot. We are using silver collagen and a wound VAC. 03/23/2022: The undermining continues to contract. The wound surface is a little bit dry. 03/30/2022: The undermining has essentially resolved. There is still some depth at the center of the wound. The  wound surface has  good beefy tissue and the moisture balance is better. 04/06/2022: The wound dimensions are about the same, except that the depth is beginning to fill in. He has had more drainage this week, but there is no obvious concern for infection. 04/13/2022: He has built up a little bit of callus around the edges of the wound, creating some undermining. Otherwise the wound looks fairly unchanged. 04/20/2022: The wound bed tissue looks very healthy and robust. Light biofilm on the surface. There is some built up wet callus around the wound edges, but no undermining. 04/27/2022: The wound surface continues to look very healthy. Although the wound dimensions measured the same, it looks smaller to me. No real callus accumulation this week. Minimal slough on the surface. 05/04/2022: The wound measured smaller and shallower this week. We are still awaiting insurance approval for Apligraf. 05/11/2022: The wound is about the same in terms of depth but measured a little bit narrower today. He has built up a rim of moist callus around the edges. He has been approved for Apligraf and we will apply that today. 05/18/2022: The wound measured a little bit smaller again today. No significant callus buildup this week. 05/25/2022: The wound was measured about the same size today, but the multiple nooks and crannies in the depths of the wound seem to be filling in. 06/01/2022: The wound dimensions are smaller today, with the exception of the depth. There is a little bit of moist callus around the edges. 06/08/2022: The wound continues to contract aside from the depth. Once again, there is moist callus around the edges. The odor is less profound this week. 3/13; patient presents for follow-up. He has had 5 applications of Apligraf and is currently not approved for more by insurance. He has also had a wound VAC in place. He has no issues or complaints today. Mccance, Max ([id number])  [id number] Page 8 of 20 Electronic Signature(s) Signed: 06/15/2022 11:58:37 AM By: [name] DO Entered By: [name] on 06/15/2022 08:54:32 -------------------------------------------------------------------------------- Physical Exam Details Patient Name: Date of Service: [name], [name] 06/15/2022 8:15 A M Medical Record Number: [id number] Patient Account Number: [id number] Date of Birth/Sex: Treating RN: 1985-11-14 (37 y.o. [name] Primary Care Provider: [name] Other Clinician: Referring Provider: Treating Provider/Extender: [name] in Treatment: 137 Constitutional respirations regular, non-labored and within target range for patient.. Cardiovascular 2+ dorsalis pedis/posterior tibialis pulses. Psychiatric pleasant and cooperative. Notes Large open wound with increased depth the center that does not probe to bone to the plantar aspect of the left foot. Mild odor present. No signs of surrounding infection. Electronic Signature(s) Signed: 06/15/2022 11:58:37 AM By: [name] DO Entered By: [name] on 06/15/2022 09:12:06 -------------------------------------------------------------------------------- Physician Orders Details Patient Name: Date of Service: [name], [name] 06/15/2022 8:15 A M Medical Record Number: [id number] Patient Account Number: [id number] Date of Birth/Sex: Treating RN: May 10, 1985 (37 y.o. 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 - Dr. [name] - Room 1 with  [name] Return A Wednesday 3/20 @ 08:15 am Bathing/ Shower/ Hygiene May shower and wash wound with soap and water. Negative Presssure Wound Therapy Wound #3 Left,Lateral,Plantar Foot Wound Vac to  wound continuously at 134m/hg pressure Black Foam Edema Control - Lymphedema / SCD / Other Bilateral Lower Extremities Avoid standing for long periods of time. Exercise regularly Compression stocking or Garment 20-30 mm/Hg pressure to: - to both legs daily Off-Loading Fellner, CMali([id number] Page 9 of 20 Open toe surgical shoe to: - Both feet Additional Orders / Instructions Follow Nutritious Diet Non Wound Condition pply the following to affected area as directed: - continue to apply foam donut or cushion to healed right plantar foot A Wound Treatment Wound #3 - Foot Wound Laterality: Plantar, Left, Lateral Cleanser: Soap and Water 3 x Per Week/30 Days Discharge Instructions: May shower and wash wound with dial antibacterial soap and water prior to dressing change. Cleanser: Vashe 5.8 (oz) 3 x Per Week/30 Days Discharge Instructions: Cleanse the wound with Vashe prior to applying a clean dressing, let sit on wound for 5-10 minutesusing gauze sponges, not tissue or cotton balls. Peri-Wound Care: Skin Prep (Generic) 3 x Per Week/30 Days Discharge Instructions: Use skin prep as directed Topical: Gentamicin 3 x Per Week/30 Days Discharge Instructions: thin layer to wound bed in clinic Prim Dressing: Promogran Prisma Matrix, 4.34 (sq in) (silver collagen) (DME) (Dispense As Written) 3 x Per Week/30 Days ary Discharge Instructions: Moisten collagen with saline or hydrogel Prim Dressing: VAC ary 3 x Per Week/30 Days Secondary Dressing: Zetuvit Plus 4x8 in (Generic) 3 x Per Week/30 Days Discharge Instructions: Apply over primary dressing as directed. Secured With: Elastic Bandage 4 inch (ACE bandage) (Generic) 3 x Per Week/30 Days Discharge  Instructions: Secure with ACE bandage as directed. Secured With: 13M Medipore SPublic affairs consultantSurgical T 2x10 (in/yd) (Generic) 3 x Per Week/30 Days ape Discharge Instructions: Secure with tape as directed. Compression Wrap: Kerlix Roll 4.5x3.1 (in/yd) (Generic) 3 x Per Week/30 Days Discharge Instructions: Apply Kerlix and Coban compression as directed. Electronic Signature(s) Signed: 06/15/2022 11:58:37 AM By: HKalman ShanDO Entered By: HKalman Shanon 06/15/2022 09:14:46 -------------------------------------------------------------------------------- Problem List Details Patient Name: Date of Service: ALewie [name] [name] 06/15/2022 8:15 A M Medical Record Number: [id number] Account Number: [id number] of Birth/Sex: Treating RN: 604/22/1987(37y.o. MErnestene MentionPrimary Care Provider: PSeward CarolOther Clinician: Referring Provider: Treating Provider/Extender: HHerbie Drapein Treatment: [phone] Problems ICD-10 Encounter Code Description Active Date MDM Diagnosis L97.528 Non-pressure chronic ulcer of other part of left foot with other specified 10/24/2019 No Yes severity E11.621 Type 2 diabetes mellitus with foot ulcer 10/24/2019 No Yes Inactive Problems [name], CMali([id number] Page 10 of 20 ICD-10 Code Description Active Date Inactive Date L97.518 Non-pressure chronic ulcer of other part of right foot with other specified severity 07/14/2020 07/14/2020 L97.518 Non-pressure chronic ulcer of other part of right foot with other specified severity 10/24/2019 10/24/2019 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, right ankle and foot 10/24/2019 10/24/2019 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 08/10/2020 08/10/2020 [id number] chronic hematogenous osteomyelitis, left ankle and foot 10/24/2019 10/24/2019 L97.322 Non-pressure chronic ulcer of left ankle with fat layer  exposed 09/29/2021 09/29/2021 B95.62 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 10/24/2019 10/24/2019 classified elsewhere Resolved Problems Electronic Signature(s) Signed: 06/15/2022 11:58:37 AM By: HKalman ShanDO Entered By: HKalman Shanon 06/15/2022 08:53:30 -------------------------------------------------------------------------------- Progress Note Details Patient Name: Date of Service: ALewie [name] [name] 06/15/2022 8:15 A M Medical Record Number: [id number] Account Number: [id number] of Birth/Sex: Treating RN: 621-Apr-1987(37y.o. 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 He comes in for review of a wound over the plantar fifth metatarsal head extending into the lateral part of the foot. He was followed for this previously by his podiatrist Dr. Cornelius Moras. As the patient tells his story he went to see podiatry first for a swelling he developed on the lateral part of his fifth metatarsal head in May. He states this was "open" by podiatry and the area closed. He was followed up in June and it was again opened callus removed and it closed promptly. There were plans being made for surgery on the fifth metatarsal head in June however his blood sugar was apparently too high for anesthesia. Apparently the area was debrided and opened again in June  and it is never closed since. Looking over the records from podiatry I am really not able to follow this. It was clear when he was first seen it was before 5/14 at that point he already had a wound. By 5/17 the ulcer was resolved. I do not see anything about a procedure. On 5/28 noted to have pre-ulcerative moderate keratosis. X-ray noted 1/5 contracted toe and tailor's bunion and metatarsal deformity. On a visit date on 09/28/2018 the dorsal part of the left foot it healed and resolved. There was concern about swelling in his lower extremity he was sent to the ER.. As far as I can tell he was seen in the ER on 7/12 with an ulcer on his left foot. A DVT rule out of the left leg was negative. I do not think I have complete records from podiatry but I am not able to verify the procedures this patient states he had. He states after the last procedure the wound has never closed although I am not able to follow this in the records I have from podiatry. He has not had a recent x-ray The patient has been using Neosporin on the wound. He is wearing a Darco shoe. He is still very active up on his foot working and exercising. Past medical history; type 2 diabetes ketosis-prone, leg swelling with a negative DVT study in July. Non-smoker ABI in our clinic was 0.85 on the left 10/16; substantial wound on the plantar left fifth met head extending laterally almost to the dorsal fifth MTP. We have been using silver alginate we gave him a Darco forefoot off loader. An x-ray did not show evidence of osteomyelitis did note soft tissue emphysema which I think was due to gas tracking through an open wound. There is no doubt in my mind he requires an MRI 10/23; MRI not booked until 3 November at the earliest this is largely due to his glucose sensor in the right arm. We have been using silver alginate. There has been an improvement [name], Max ([id number]) [id number] Page 11 of 20 10/29; I  am still not exactly sure when his MRI is booked for. He says it is the third but it is the 10th in epic. This definitely needs to be done.  He is running a low-grade fever today but no other symptoms. No real improvement in the 1 02/26/2019 patient presents today for a follow-up visit here in our clinic he is last been seen in the clinic on October 29. Subsequently we were working on getting MRI to evaluate and see what exactly was going on and where we would need to go from the standpoint of whether or not he had osteomyelitis and again what treatments were going be required. Subsequently the patient ended up being admitted to the hospital on 02/07/2019 and was discharged on 02/14/2019. This is a somewhat interesting admission with a discharge diagnosis of pneumonia due to COVID-19 although he was positive for COVID-19 when tested at the urgent care but negative x2 when he was actually in the hospital. With that being said he did have acute respiratory failure with hypoxia and it was noted he also have a left foot ulceration with osteomyelitis. With that being said he did require oxygen for his pneumonia and I level 4 L. He was placed on antivirals and steroids for the COVID-19. He was also transferred to the [facility] at one point. Nonetheless he did subsequently discharged home and since being home has done much better in that regard. The CT angiogram did not show any pulmonary embolism. With regard to the osteomyelitis the patient was placed on vancomycin and Zosyn while in the hospital but has been changed to Augmentin at discharge. It was also recommended that he follow- up with wound care and podiatry. Podiatry however wanted him to see [name] according to the patient prior to them doing anything further. His hemoglobin A1c was 9.9 as noted in the hospital. Have an MRI of the left foot performed while in the hospital on 02/04/2019. 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 There is an overlying plantar open wound noted an abscess tracking back along the lateral aspect of the fifth metatarsal shaft. There is otherwise diffuse cellulitis and mild fasciitis without findings of polymyositis. The patient did have recently pneumonia secondary to COVID-19 I looked in the chart through epic and it does appear that the patient may need to have an additional x-ray just to ensure everything is cleared and that he has no airspace disease prior to putting him into the [name]. 03/05/2019; patient was readmitted to the clinic last week. He was hospitalized twice for a viral upper respiratory tract infection from 11/1 through 11/4 and then 11/5 through 11/12 ultimately this turned out to be Covid pneumonitis. Although he was discharged on oxygen he is not using it. He says he feels fine. He has no exercise limitation no cough no sputum. His O2 sat in our clinic today was 100% on room air. 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 He received Vanco and Zosyn in the hospital and then was discharged on 2 weeks of Augmentin. I do not see any relevant cultures. He was supposed to follow-up with infectious disease but I do not see that he has an appointment. 12/8; patient saw Dr. Novella Olive of infectious disease last week. He felt that he had had adequate antibiotic therapy. He did not go to follow-up with Dr. Amalia Hailey of podiatry and I have again talked to him about the pros and cons of this. He does not want to consider  a ray amputation of this time. He is aware of the risks of recurrence, migration etc. He started HBO today and tolerated this well. He can complete the Augmentin that I gave him last week. I have looked over the lab work that Dr. Chana Bode ordered his C-reactive protein was 3.3 and  his sedimentation rate was 17. The C-reactive protein is never really been measurably that high in this patient 12/15; not much change in the wound today however he has undermining along the lateral part of the foot again more extensively than last week. He has some rims of epithelialization. We have been using silver alginate. He is undergoing hyperbarics but did not dive today 12/18; in for his obligatory first total contact cast change. Unfortunately there was pus coming from the undermining area around his fifth metatarsal head. This was cultured but will preclude reapplication of a cast. He is seen in conjunction with HBO 12/24; patient had staph lugdunensis in the wound in the undermining area laterally last time. We put him on doxycycline which should have covered this. The wound looks better today. I am going to give him another week of doxycycline before reattempting the total contact cast 12/31; the patient is completing antibiotics. Hemorrhagic debris in the distal part of the wound with some undermining distally. He also had hyper granulation. Extensive debridement with a #5 curette. The infected area that was on the lateral part of the fifth met head is closed over. I do not think he needs any more antibiotics. Patient was seen prior to HBO. Preparations for a total contact cast were made in the cast will be placed post hyperbarics 04/11/19; once again the patient arrives today without complaint. He had been in a cast all week noted that he had heavy drainage this week. This resulted in large raised areas of macerated tissue around the wound 1/14; wound bed looks better slightly smaller. Hydrofera Blue has been changing himself. He had a heavy drainage last week which caused a lot of maceration around the wound so I took him out of a total contact cast he says the drainage is actually better this week He is seen today in conjunction with HBO 1/21; returns to clinic. He was up in [place]  for a day or 2 attending a funeral. He comes back in with the wound larger and with a large area of exposed bone. He had osteomyelitis and septic arthritis of the fifth left metatarsal head while he was in hospital. He received IV antibiotics in the hospital for a prolonged period of time then 3 weeks of Augmentin. Subsequently I gave him 2 weeks of doxycycline for more superficial wound infection. When I saw this last week the wound was smaller the surface of the wound looks satisfactory. 1/28; patient missed hyperbarics today. Bone biopsy I did last time showed Enterococcus faecalis and Staphylococcus lugdunensis . He has a wide area of exposed bone. We are going to use silver alginate as of today. I had another ethical discussion with the patient. 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 In this situation I think the likelihood of healing this is low. Therefore I have recommended a ray amputation and with the patient's agreement I have referred him to Dr. Doran Durand. The other issue is that his compliance with hyperbarics has been minimal because of his work schedule and given his underlying decision I am going to stop this today READMISSION 10/24/2019 MRI 09/29/2019 left foot IMPRESSION: 1. Apparent skin ulceration  inferior and lateral to the 5th metatarsal base with underlying heterogeneous T2 signal and enhancement in the subcutaneous fat. Small peripherally enhancing fluid collections along the plantar and lateral aspects of the 5th metatarsal base suspicious for abscesses. 2. Interval amputation through the mid 5th metatarsal with nonspecific low-level marrow edema and enhancement. Given the proximity to the adjacent soft tissue inflammatory changes, osteomyelitis cannot be excluded. 3. The additional bones appear unremarkable. MRI 09/29/2019 right foot IMPRESSION: 1. Soft tissue ulceration lateral to the 5th MTP joint. There is low-level T2  hyperintensity within the 4th and 5th metatarsal heads and adjacent proximal phalanges without abnormal T1 signal or cortical destruction. These findings are nonspecific and could be seen with early marrow edema, hyperemia or early osteomyelitis. No evidence of septic joint. 2. Mild tenosynovitis and synovial enhancement associated with the extensor digitorum tendons at the level of the midfoot. 3. Diffuse low-level muscular T2 hyperintensity and enhancement, most consistent with diabetic myopathy. LEFT FOOT BONE [name], Max ([id number]) [id number] Page 12 of 20 Methicillin resistant staphylococcus aureus Staphylococcus lugdunensis MIC MIC CIPROFLOXACIN >=8 RESISTANT Resistant <=0.5 SENSI... Sensitive CLINDAMYCIN <=0.25 SENS... Sensitive >=8 RESISTANT Resistant ERYTHROMYCIN >=8 RESISTANT Resistant >=8 RESISTANT Resistant GENTAMICIN <=0.5 SENSI... Sensitive <=0.5 SENSI... Sensitive Inducible Clindamycin NEGATIVE Sensitive NEGATIVE Sensitive OXACILLIN >=4 RESISTANT Resistant 2 SENSITIVE Sensitive RIFAMPIN <=0.5 SENSI... Sensitive <=0.5 SENSI... Sensitive TETRACYCLINE <=1 SENSITIVE Sensitive <=1 SENSITIVE Sensitive TRIMETH/SULFA <=10 SENSIT Sensitive <=10 SENSIT Sensitive ... Marland Kitchen.. VANCOMYCIN 1 SENSITIVE Sensitive <=0.5 SENSI... Sensitive Right foot bone . Component 3 wk ago Specimen Description BONE Special Requests RIGHT 4 METATARSAL SAMPLE B Gram Stain NO WBC SEEN NO ORGANISMS SEEN Culture RARE METHICILLIN RESISTANT STAPHYLOCOCCUS AUREUS NO ANAEROBES ISOLATED Performed at [facility], [place] [address] [place], [place] [address] Report Status 10/08/2019 FINAL Organism ID, Bacteria METHICILLIN RESISTANT STAPHYLOCOCCUS AUREUS Resulting Agency [facility] Susceptibility Methicillin resistant staphylococcus aureus MIC CIPROFLOXACIN >=8 RESISTANT Resistant CLINDAMYCIN <=0.25 SENS... Sensitive ERYTHROMYCIN >=8 RESISTANT Resistant GENTAMICIN  <=0.5 SENSI... Sensitive Inducible Clindamycin NEGATIVE Sensitive OXACILLIN >=4 RESISTANT Resistant RIFAMPIN <=0.5 SENSI... Sensitive TETRACYCLINE <=1 SENSITIVE Sensitive TRIMETH/SULFA <=10 SENSIT Sensitive ... VANCOMYCIN 1 SENSITIVE Sensitive This is a patient we had in clinic earlier this year with a wound over his left fifth metatarsal head. 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 Doran Durand on 05/16/2019. He seems to have developed problems with wounds on his bilateral feet in June 2021 although he may have had problems earlier than this. He was in an urgent care with a right foot ulcer on 09/26/2019 and given a course of doxycycline. This was apparently after having trouble getting into see orthopedics. He was seen by podiatry on 09/28/2019 noted to have bilateral lower extremity ulcers including the left lateral fifth metatarsal base and the right subfifth met head. It was noted that had purulent drainage at that time. He required hospitalization from 6/20 through 7/2. This was because of worsening right foot wounds. He underwent bilateral operative incision and drainage and bone biopsies bilaterally. Culture results are listed above. He has been referred back to clinic by Dr. Jacqualyn Posey of podiatry. He is also followed by Dr. Megan Salon who saw him yesterday. He was discharged from hospital on Zyvox Flagyl and Levaquin and yesterday changed to doxycycline Flagyl  and Levaquin. His inflammatory markers on 6/26 showed a sedimentation rate of 129 and a C-reactive protein of 5. This is improved to 14 and 1.3 respectively. This would indicate improvement. ABIs in our clinic today were 1.23 on the right and 1.20 on the  left 11/01/2019 on evaluation today patient appears to be doing fairly well in regard to the wounds on his feet at this point. Fortunately there is no signs of active infection at this time. No fevers, chills, nausea, vomiting, or diarrhea. He currently is seeing infectious disease and still under their care at this point. Subsequently he also has both wounds which she has not been using collagen on as he did not receive that in his packaging he did not call us and let us know that. Apparently that just was missed on the order. Nonetheless we will get that straightened out today. 8/9-Patient returns for bilateral foot wounds, using Prisma with hydrogel moistened dressings, and the wounds appear stable. Patient using surgical shoes, avoiding much pressure or weightbearing as much as possible 8/16; patient has bilateral foot wounds. 1 on the right lateral foot proximally the other is on the left mid lateral foot. Both required debridement of callus and thick skin around the wounds. We have been using silver collagen 8/27; patient has bilateral lateral foot wounds. The area on the left substantially surrounded by callus and dry skin. This was removed from the wound edge. The underlying wound is small. The area on the right measured somewhat smaller today. 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 Unfortunately I did not update his antibiotics during today's visit. 9/10 I reviewed Dr. Hale Bogus last notes he felt he had completed antibiotics his inflammatory markers were reasonably well controlled. He has a small wound on the lateral left foot and a tiny area on the right which is just above closed. He is using Hydrofera Blue with border foam he has bilateral surgical shoes 9/24; 2 week f/u. doing well. right foot is closed. left foot still undermined. 10/14; right foot remains closed at the fifth met head. The area over the base of the  left fifth metatarsal has a small open area but considerable undermining towards the plantar foot. Thick callus skin around this suggests an adequate pressure relief. We have talked about this. He says he is going to go back into his cam boot. I suggested a total contact cast he did not seem enamored with this suggestion 10/26; left foot base of the fifth metatarsal. Same condition as last time. He has skin over the area with an open wound however the skin is not adherent. He went to see Dr. Earleen Newport who did an x-ray and culture of his foot I have not reviewed the x-ray but the patient was not told anything. He is on doxycycline 11/11; since the patient was last here he was in the emergency room on 10/30 he was concerned about swelling in the left foot. They did not do any cultures or x-rays. They changed his antibiotics to cephalexin. Previous culture showed group B strep. The cephalexin is appropriate as doxycycline has less than predictable coverage. Arrives in clinic today with swelling over this area under the wound. He also has a new wound on the right fifth metatarsal head 11/18; the patient has a difficult wound on the lateral aspect of the left fifth metatarsal head. The wound was almost ballotable last week I opened it slightly expecting to see  purulence however there was just bleeding. I cultured this this was negative. X-ray unchanged. We are trying to get an MRI but I am not sure were going to be able to get this through his insurance. He also has an area on the right lateral fifth metatarsal head this looks healthier 12/3; the patient finally got our MRI. Surprisingly this did not show osteomyelitis. I did show the soft tissue ulceration at the lateral plantar aspect of the fifth metatarsal base with a tiny residual 6 mm abscess overlying the superficial fascia I have tried to culture this area I have not been able to get this to grow anything. Nevertheless the protruding tissue looks  aggravated. I suspect we should try to treat the underlying "abscess with broad-spectrum antibiotics. I am going to start him on Levaquin and Flagyl. He has much less edema in his legs and I am going to continue to wrap his legs and see him weekly 12/10. I started Levaquin and Flagyl on him last week. He just picked up the Flagyl apparently there was some delay. The worry is the wound on the left fifth metatarsal base which is substantial and worsening. His foot looks like he inverts at the ankle making this a weightbearing surface. Certainly no improvement in fact I think the measurements of this are somewhat worse. We have been using [place], Max ([id number]) [id number] Page 13 of 20 12/17; he apparently just got the Levaquin yesterday this is 2 weeks after the fact. He has completed the Flagyl. The area over the left fifth metatarsal base still has protruding granulation tissue although it does not look quite as bad as it did some weeks ago. He has severe bilateral lymphedema although we have not been treating him for wounds on his legs this is definitely going to require compression. There was so much edema in the left I did not wish to put him in a total contact cast today. I am going to increase his compression from 3-4 layer. The area on the right lateral fifth met head actually look quite good and superficial. 12/23; patient arrived with callus on the right fifth met head and the substantial hyper granulated callused wound on the base of his fifth metatarsal. He says he is completing his Levaquin in 2 days but I do not think that adds up with what I gave him but I will have to double check this. We are using Hydrofera Blue on both areas. My plan is to put the left leg in a cast the week after New Year's 04/06/2020; patient's wounds about the same. Right lateral fifth metatarsal head and left lateral foot over the base of the fifth metatarsal. There is  undermining on the left lateral foot which I removed before application of total contact cast continuing with Hydrofera Blue new. Patient tells me he was seen by endocrinology today lab work was done [Dr. Kerr]. Also wondering whether he was referred to cardiology. I went over some lab work from previously does not have chronic renal failure certainly not nephrotic range proteinuria he does have very poorly controlled diabetes but this is not his most updated lab work. Hemoglobin A1c has been over 11 1/10; the patient had a considerable amount of leakage towards mid part of his left foot with macerated skin however the wound surface looks better the area on the right lateral fifth met head is better as well. I am going to change the dressing on the left foot under the total contact cast  to silver alginate, continue with Hydrofera Blue on the right. 1/20; patient was in the total contact cast for 10 days. Considerable amount of drainage although the skin around the wound does not look too bad on the left foot. The area on the right fifth metatarsal head is closed. Our nursing staff reports large amount of drainage out of the left lateral foot wound 1/25; continues with copious amounts of drainage described by our intake staff. PCR culture I did last week showed E. coli and Enterococcus faecalis and low quantities. Multiple resistance genes documented including extended spectrum beta lactamase, MRSA, MRSE, quinolone, tetracycline. The wound is not quite as good this week as it was 5 days ago but about the same size 2/3; continues with copious amounts of malodorous drainage per our intake nurse. The PCR culture I did 2 weeks ago showed E. coli and low quantities of Enterococcus. There were multiple resistance genes detected. I put Neosporin on him last week although this does not seem to have helped. The wound is slightly deeper today. Offloading continues to be an issue here although with the amount of  drainage she has a total contact cast is just not going to work 2/10; moderate amount of drainage. Patient reports he cannot get his stocking on over the dressing. I told him we have to do that the nurse gave him suggestions on how to make this work. The wound is on the bottom and lateral part of his left foot. Is cultured predominantly grew low amounts of Enterococcus, E. coli and anaerobes. There were multiple resistance genes detected including extended spectrum beta lactamase, quinolone, tetracycline. I could not think of an easy oral combination to address this so for now I am going to do topical antibiotics provided by [facility] I think the main agents here are vancomycin and an aminoglycoside. We have to be able to give him access to the wounds to get the topical antibiotic on 2/17; moderate amount of drainage this is unchanged. He has his Keystone topical antibiotic against the deep tissue culture organisms. He has been using this and changing the dressing daily. Silver alginate on the wound surface. 2/24; using Keystone antibiotic with silver alginate on the top. He had too much drainage for a total contact cast at one point although I think that is improving and I think in the next week or 2 it might be possible to replace a total contact cast I did not do this today. In general the wound surface looks healthy however he continues to have thick rims of skin and subcutaneous tissue around the wide area of the circumference which I debrided 06/04/2020 upon evaluation today patient appears to be doing well in regard to his wound. I do feel like he is showing signs of improvement. There is little bit of callus and dead tissue around the edges of the wound as well as what appears to be a little bit of a sinus tract that is off to the side laterally I would perform debridement to clear that away today. 3/17; left lateral foot. The wound looks about the same as I remember. Not much depth surface looks  healthy. No evidence of infection 3/25; left lateral foot. Wound surface looks about the same. Separating epithelium from the circumference. There really is no evidence of infection here however not making progress by my view 3/29; left lateral foot. Surface of the wound again looks reasonably healthy still thick skin and subcutaneous tissue around the wound margins. There is  no evidence of infection. One of the concerns being brought up by the nurses has again the amount of drainage vis--vis continued use of a total contact cast 4/5; left lateral foot at roughly the base of the fifth metatarsal. Nice healthy looking granulated tissue with rims of epithelialization. The overall wound measurements are not any better but the tissue looks healthy. The only concern is the amount of drainage although he has no surrounding maceration with what we have been doing recently to absorb fluid and protect his skin. He also has lymphedema. 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 He states that the kerlix/coban under the TCC rolled down and could not get it back up. He has been using an offloading scooter and has somehow hurt his right foot using this device. This happened last week. He states that the side of his right foot developed a blister and opened. The top of his foot also has a few small open wounds he thinks is due to his socks rubbing in his shoes. He has not been using any dressings to the wound. He denies purulent drainage, fever/chills or erythema to the wounds. 4/22; patient presents for 1 week follow-up. He developed new wounds to the right foot that were evaluated at last clinic visit. He continues to have a total contact cast to the left leg and he  reports no issues. He has been using silver collagen to the right foot wounds with no issues. He denies purulent drainage, fever/chills or erythema to the right foot wounds. He has no complaints today 4/25; patient presents for 1 week follow-up. He has a total contact cast of the left leg and reports no issues. He has been using silver alginate to the right foot wound. He denies purulent drainage, fever/chills or erythema to the right foot wounds. 5/2 patient presents for 1 week follow-up. T contact cast on the left. The wound which is on the base of the plantar foot at the base of the fifth metatarsal otal actually looks quite good and dimensions continue to gradually contract. HOWEVER the area on the right lateral fifth metatarsal head is much larger than what I remember from 2 weeks ago. Once more is he has significant levels of hypergranulation. Noteworthy that he had this same hyper granulated response on his wound on the left foot at one point in time. So much so that he I thought there was an underlying fluid collection. Based on this I think this just needs debridement. 5/9; the wound on the left actually continues to be gradually smaller with a healthy surface. Slight amount of drainage and maceration of the skin around but not too bad. However he has a large wound over the right fifth metatarsal head very much in the same configuration as his left foot wound was initially. I used silver nitrate to address the hyper granulated tissue no mechanical debridement 5/16; area on the left foot did not look as healthy this week deeper thick surrounding macerated skin and subcutaneous tissue. oo The area on the right foot fifth met head was about the same oo The area on the right ankle that we identified last week is completely broken down into an open wound presumably a stocking rubbing issue  5/23; patient has been using a total contact cast to the left side. He has been using silver alginate  underneath. He has also been using silver alginate to the right foot wounds. He has no complaints today. He denies any signs of infection. 5/31; the left-sided wound looks some better measure smaller surface granulation looks better. We have been using silver alginate under the total contact cast oo The large area on his right fifth met head and right dorsal foot look about the same still using silver alginate 6/6; neither side is good as I was hoping although the surface area dimensions are better. A lot of maceration on his left and right foot around the wound edge. Area on the dorsal right foot looks better. He says he was traveling. I am not sure what does the amount of maceration around the plantar wounds may be drainage issues 6/13; in general the wound surfaces look quite good on both sides. Macerated skin and raised edges around the wound required debridement although in general especially on the left the surface area seems improved. oo The area on the right dorsal ankle is about the same I thought this would not be such a problem to close 6/20; not much change in either wound although the one on the right looks a little better. Both wounds have thick macerated edges to the skin requiring debridements. We have been using silver alginate. The area on the dorsal right ankle is still open I thought this would be closed. [name], Max ([id number]) [id number] Page 14 of 20 6/28; patient comes in today with a marked deterioration in the right foot wound fifth met head. Wide area of exposed bone this is a drastic change from last time. The area on the left there we have been casting is stagnant. We have been using silver alginate in both wound areas. 7/5; bone culture I did for PCR last time was positive for Pseudomonas, group B strep, Enterococcus and Staph aureus. There was no suggestion of methicillin resistance or ampicillin resistant genes. This was resistant to  tetracycline however He comes into the clinic today with the area over his right plantar fifth metatarsal head which had been doing so well 2 weeks ago completely necrotic feeling bone. I do not know that this is going to be salvageable. The left foot wound is certainly no smaller but it has a better surface and is superficial. 7/8; patient called in this morning to say that his total contact cast was rubbing against his foot. He states he is doing fine overall. He denies signs of infection. 7/12; continued deterioration in the wound over the right fifth metatarsal head crumbling bone. This is not going to be salvageable. The patient agrees and wants to be referred to Dr. Doran Durand which we will attempt to arrange as soon as possible. I am going to continue him on antibiotics as long as that takes so I will renew those today. The area on the left foot which is the base of the fifth metatarsal continues to look somewhat better. Healthy looking tissue no depth no debridement is necessary here. 7/20; the patient was kindly seen by Dr. Doran Durand of orthopedics on 10/19/2020. 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 Towards this end we have taken him out of the total contact cast on the left we will put him in a wrap with Hydrofera Blue.  As I understand things surgery is planned for 7/21 7/27; patient had his surgery last Thursday. 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 Apparently everything went well we did not still disturb that today The area on the left foot actually looks quite good. He has been much less mobile which probably explains this he did not seem to do well in the total contact cast secondary to drainage and maceration I think. 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 Fortunately there is  no evidence of active infection at this time. No fevers, chills, nausea, vomiting, or diarrhea. Overall I think that he is actually doing extremely well. Nonetheless I do believe that he is staying off of this more following the surgery in his right foot that is the reason the left is doing so great. 8/16; left plantar foot wound. This looks smaller than the last time I saw this he is using Hydrofera Blue. The surgical wound on the right foot is being followed by Dr. Doran Durand we did not look at this today. He has surgical shoes on both feet 8/23; left plantar foot wound not as good this week. Surrounding macerated skin and subcutaneous tissue everything looks moist and wet. I do not think he is offloading this adequately. He is using a surgical shoe Apparently the right foot surgical wound is not open although I did not check his foot 8/31; left plantar foot lateral aspect. Much improved this week. He has no maceration. Some improvement in the surface area of the wound but most impressively the depth is come in we are using silver alginate. The patient is a Product/process development scientist. He is asked that we write him a letter so he can go back to work. I have also tried to see if we can write something that will allow him to limit the amount of time that he is on his foot at work. 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 Hopefully the latter can be avoided 9/6; I believe the patient missed an appointment last week. He arrives in today with a wound looking roughly the same certainly no better. Undermining laterally and also inferiorly. We used molecuLight today in training with the patient's permission.. We are using silver alginate 9/21 wound is measuring bigger this week although this may have to do with the aggressive circumferential debridement last week in response to the blush fluorescence on the MolecuLight. Culture I did  last week showed significant MSSA and E. coli. I put him on Augmentin but he has not started it yet. We are also going to send this for compounded antibiotics at [facility]. There is no evidence of systemic infection 9/29; silver alginate. His Keystone arrived. He is completing Augmentin in 2 days. Offloading in a cam boot. Moderate drainage per our intake staff 10/5; using silver alginate. He has been using his [place]. He has completed his Augmentin. Per our intake nurse still a lot of drainage, far too much to consider a total contact cast. Wound measures about the same. He had the same undermining area that I defined last week from a roughly 11-3. I remove this today 10/12; using silver alginate he is using the [place]. He comes in for a nurse visit hence we are applying [employer] twice a week. Measuring slightly better today and  less notable drainage. Extensive debridement of the wound edge last time 10/18; using topical Keystone and silver alginate and a soft cast. Wound measurements about the same. Drainage was through his soft cast. We are changing this twice a week Tuesdays and Friday 10/25; comes in with moderate drainage. Still using Keystone silver alginate and a soft cast. Wound dimensions completely the same.He has a lot of edema in the left leg he has lymphedema. Asking for [name] to consider wrapping him as he cannot get his stocking on over the soft cast 11/2; comes in with moderate to large drainage slightly smaller in terms of width we have been using [place]. His wound looks satisfactory but not much improvement 11/4; patient presents today for obligatory cast change. Has no issues or complaints today. He denies signs of infection. 11/9; patient traveled this weekend to [place], was on the cast quite a bit. Staining of the cast with black material from his walking boot. Drainage was not quite as bad as we feared. Using silver alginate and Keystone 11/16; we do not have size for cast  therefore we have been putting a soft cast on him since the change on Friday. Still a significant amount of drainage necessitating changing twice a week. We have been using the Keystone at cast changes either hard or soft as well as silver alginate Comes in the clinic with things actually looking fairly good improvement in width. He says his offloading is about the same 02/24/2021 upon evaluation today patient actually comes back in and is doing excellent in regard to his foot ulcer this is significantly smaller even compared to the last visit. The soft cast seems to have done extremely well for him which is great news. I do not see any signs of infection minimal debridement will be needed today. 11/30; left lateral foot much improved half a centimeter improvement in surface area. No evidence of infection. He seems to be doing better with the soft cast in the TCC therefore we will continue with this. He comes back in later in the week for a change with the nurses. This is due to drainage 12/6; no improvement in dimensions. Under illumination some debris on the surface we have been using silver alginate, soft cast. If there is anything optimistic here he seems to have have less drainage 12/13. Dimensions are improved both length and width and slightly in depth. Appears to be quite healthy today. Raised edges of this thick skin and callus around the edges however. He is in a soft cast were bringing him back once for a change on Friday. Drainage is better 12/20. Dimensions are improved. He still has raised edges of thick skin and callus around the edges. We are using a soft cast 12/28; comes in today with thick callus around the wound. Using silver under alginate under a soft cast. I do not think there is much improvement in any measurement 2023 04/06/2021; patient was put in a total contact cast. Unfortunately not much change in surface area 1/10; not much different still thick callus and skin around  the edge in spite of the total contact cast. This was just debrided last week we have been using the [facility] compounded antibiotic and silver alginate under a total contact cast 1/18 the patient's wound on the left side is doing nicely. smaller [place], Max ([id number]) [id number] Page 15 of 20 HOWEVER he comes in today with a wound on the right foot laterally. blister most likely serosangquenous drainage 1/24; the patient continues  to do well in terms of the plantar left foot which is continued to contract using silver alginate under the total contact cast HOWEVER the right lateral foot is bigger with denuded skin around the edges. I used pickups and a #15 scalpel to remove this this looks like the remanence of a large blister. Cannot rule out infection. Culture in this area I did last week showed Staphylococcus lugdunensis few colonies. I am going to try to address this with his [employer] antibiotic that is done so well on the left having linezolid and this should cover the staph 2/1; the patient's wound on his left foot which was the original plantar foot wound thick skin and eschar around the edges even in the total contact cast but the wound surface does not look too bad The real problem is on how his right lateral foot at roughly the base of the fifth metatarsal. The wound is completely necrotic more worrisome than that there is swelling around the edges of this. We have been using silver alginate on both wounds and Keystone on the right foot. Unfortunately I think he is going to require systemic antibiotics while we await cultures. He did not get the x-ray done that we ordered last week [lost the prescription 2/7; disappointingly in the area on the left foot which we are treating with a total contact cast is still not closed although it is much smaller. He continues to have a lot of callus around the wound edge. -Right lateral foot culture I did last week was  negative x-ray also negative for osteomyelitis. 2/15: TCC silver alginate on the left and silver alginate on the right lateral. No real improvement in either area 05/26/2021: T oday, the wounds are roughly the same size as at his previous visit, post-debridement. He continues to endorse fairly substantial drainage, particularly on the right. He has been in a total contact cast on the left. There is still some callus surrounding this lesion. On the right, the periwound skin is quite macerated, along with surrounding callus. The center of the right-sided wound also has some dark, densely adherent material, which is very difficult to remove. 06/02/2021: Today, both wounds are slightly smaller. He has been using zinc oxide ointment around the right ulcer and the degree of maceration has improved markedly. There continues to be an area of nonviable tissue in the center of the right sided ulcer. The left-sided wound, which has been in the total contact cast. Appears clean and the degree of callus around it is less than previously. 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 He had to take the stairs and both wounds have increased in size. The left foot, which has been in a total contact cast, has developed a tunnel tracking to the lateral aspect of his foot. The nonviable tissue in the center of the right-sided ulcer remains recalcitrant to debridement. There is significant undermining surrounding the entirety of the left sided wound. 06/16/2021: The elevator at school has been fixed and the patient has been able to avoid putting as much weight on his wounds over the past week. We converted the left foot wound into a single lesion today, but despite this, the wound is actually smaller. The base is healthy with limited periwound callus. On the right, the central necrotic area is still present. He continues to be quite macerated around the right sided  wound, despite applying barrier cream. This does, however, have the  benefit of softening the callus to make it more easily removable. 06/23/2021: Today, the left wound is smaller. The lateral aspect that had opened up previously is now closed. The wound base has a healthy bed of granulation tissue and minimal slough. Unfortunately, on the right, the wound is larger and continues to be fairly macerated. He has also reopened the wound at his right ankle. He thinks this is due to the gait he has adopted secondary to his total contact cast and boot. 06/30/2021: T oday, both wounds are a little bit larger. The lateral aspect on the left has remained closed. He continues to have significant periwound maceration. The culture that I took from the right sided wound grew a population of bacteria that is not covered by his current [facility] antibiotic. The center of the right- sided wound continues to appear necrotic with nonviable fat. It probes deeper today, but does not reach bone. 07/07/2021: The periwound maceration is a little bit less today. The right lateral foot wound has some areas that appear more viable and the necrotic center also looks a little bit better. The wound on the dorsal surface of his right foot near the ankle is contracting and the surface appears healthy. The left plantar wound surface looks healthy, but there is some new undermining on the medial portion. He did get his new Keystone antibiotic and began applying that to the right foot wound on Saturday. 07/14/2021: The intake nurse reported substantial drainage from his wounds, but the periwound skin actually looks better than is typical for him. The wound on the dorsal surface of his right foot near the ankle is smaller and just has a small open area underneath some dried eschar. The left plantar wound surface looks healthy and there has been no significant accumulation of callus. The right lateral foot wound looks quite a bit better, with  the central portion, which typically appears necrotic, looking more viable albeit pale. 07/22/2021: His left foot is extremely macerated today. The wound is about the same size. The wound on the dorsal surface of his right foot near the ankle had closed, but he traumatized it removing the dressing and there is a tiny skin tear in that location. The right lateral foot wound is bigger, but the surface appears healthy. 07/30/2021: The wound on the dorsal surface of his right foot near the ankle is closed. The right lateral foot wound again is a little bit bigger due to some undermining. The periwound skin is in better condition, however. He has been applying zinc oxide. The wound surface is a little bit dry today. On the left, he does not have the substantial maceration that we frequently see. The wound itself is smaller and has a clean surface. 08/06/2021: Both wounds seem to have deteriorated over the past week. The right lateral foot wound has a dry surface but the periwound is boggy.. Overall wound dimensions are about the same. On the left, the wound is about the same size, but there is more undermining present underneath periwound callus. 08/13/2021: The right sided wound looks about the same, but on the left there has been substantial deterioration. The undermining continues to extend under periwound callus. Once this was removed, substantial extension of the wound was present. There is no odor or purulent drainage but clearly the wounds have broken down. 08/20/2021: The wounds look about the same today. He has been out of his total contact cast and has just been changing the dressings using topical Keystone with  PolyMem Ag, Kerlix and Ace bandages. The wound on the top of his right ankle has reopened but this is quite small. There was a little bit of purulent material that I expressed when examining this wound. 08/24/2021: After the aggressive debridement I performed at his last visit, the wounds  actually look a little bit better today. They are smaller with the exception of the wound on the top of his right ankle which is a little bit bigger as some more skin pulled off when he was changing his dressing. We are using topical Keystone with PolyMem Ag Kerlix and Ace bandages. 09/02/2021: There has been really no change to any of his wounds. 09/16/2021: The patient was hospitalized last week with nausea, vomiting, and dehydration. He says that while he was in the hospital, his wounds were not really addressed properly. T oday, both plantar foot wounds are larger and the periwound skin is macerated. The wound on the dorsum of his right foot has a scab on the top. The right foot now has a crater where previously he had had nonviable fat. It looks as though this simply died and fell out. The periwound callus is wet. 09/24/2021: His wounds have deteriorated somewhat since his last visit. The wound on the dorsum of his right foot near his ankle is larger and has more nonviable tissue present. The crater in his right foot is even deeper; I cannot quite palpate or probe to bone but I am sure it is close. The wound on his left plantar foot has an odd boggy area in the center that almost feels as though it has fluid within it. He has run out of his topical Keystone antibiotic. We are using silver alginate on his wounds. 09/29/2021: He has developed a new wound on the dorsum of his left foot near his ankle. He says he thinks his wrapping is rubbing in that site. I would concur with this as the wound on his right ankle is larger. The left foot looks about the same. The right foot has the crater that was present last week. No significant slough accumulation, but his foot remains quite swollen and warm despite oral antibiotic therapy. [name], Max ([id number]) [id number] Page 16 of 20 10/08/2021: All of his wounds look about the same as last week. He did not start his oral antibiotics  that are prescribed until just a couple of days ago; his [employer] compounded antibiotics formula has been changed and he is awaiting delivery of the new recipe. His MRI that was scheduled for earlier this week was canceled as no prior authorization had been obtained; unfortunately the tech responsible sent an email to my old [place] email, which I no longer use nor have access to. 10/18/2021: The wounds on his bilateral dorsal feet near the ankles are both improved. They are smaller and have just some eschar and slough buildup. The left plantar wound has a fair amount of undermining, but the surface is clean. There is some periwound callus accumulation. On the right plantar foot, there is nonviable fat leading to a deep tunnel that tracks towards his dorsal medial foot. There is periwound callus and slough accumulation, as well. His right foot and leg remain swollen as compared to the left. 10/25/2021: The wounds on his bilateral dorsal feet and at the ankles have broken down somewhat. They are little bit larger than last week. The left plantar wound continues to undermine laterally but the surface is clean. The right plantar foot wound shows  some decreased depth in the tunnel tracking towards his dorsal medial foot. He has not yet had the Doppler study that I ordered; it sounds like there is some confusion about the scheduling of the procedure. In addition, the MRI was denied and I have taken steps to appeal the denial. 11/24/2021: Since our last visit, Mr. Vandenbos was admitted to the hospital where an MRI suggested osteomyelitis. He was taken the operating room by podiatry. Bone biopsies were negative for osteomyelitis. They debrided his wounds and applied myriad matrix. He saw them last week and they removed his staples. He is here today to continue his wound healing process. T oday, both of the dorsal foot/ankle wounds are substantially smaller. There is just a little eschar overlying the left  sided wound and some eschar and slough on the right. The right plantar foot ulcer has the healthiest surface of granulation tissue that I have seen to date. A portion of the myriad matrix failed to take and was hanging loose. It appears that myriad morcells were placed into the tunnel closest to the dorsal portion of his foot. These have sloughed off. The left plantar foot ulcer is about the same size, but has a much healthier surface than in the past. Both plantar ulcers have callus and slough accumulation. 12/07/2021: Left dorsal foot/ankle wound is closed. The right dorsal foot/ankle wound is nearly closed and just has a small open area with some eschar and slough. The right plantar foot wound has contracted quite a bit since our last visit. It has a healthy surface with just a little bit of slough accumulation and periwound callus buildup. The left plantar foot wound is about the same size but the surface appears healthy. There is a little slough and periwound callus on this side, as well. 12/15/2021: Both dorsal foot/ankle wounds are closed. The right plantar foot wound is substantially smaller than at our last visit. The tunneling that was present has nearly closed. There is just a little bit of slough buildup. The left plantar foot wound is also a little bit smaller today. The surface is the healthiest that I have ever seen it. Light slough and periwound callus accumulation on this side. 12/22/2021: The dorsal ankle/foot wounds remain closed. The right plantar foot wound continues to contract. There is still a bit of depth at the lateral portion of the wound but the surface has a good granulated appearance. The wound on the left is about the same size, the central indented portion is still adherent. It also is clean with good granulation tissue present. The periwound skin and callus are a bit macerated, however. 12/29/2021: Both ankle wounds remain closed. The right plantar foot wound is less than  half the size that it was last week. There is no depth any longer and the surface has just a little bit of slough and periwound callus. On the left, the wound is not much smaller, but the surface is healthier with good granulation tissue. There is also a little slough and periwound callus buildup. 01/05/2022: The right plantar foot wound continues to contract and is once again about half the size as it was last week. There is just a little bit of surface slough and periwound callus. On the left, the depth of the wound has decreased and the diameter has started to contract. It is also very clean with just a little slough and biofilm present. 01/12/2022: The right plantar foot wound is smaller again today. There is just some periwound callus  and slough accumulation. On the left, there is a fair amount of undermining and the surface is a little boggy, but no other significant change. 01/19/2022: The right plantar wound continues to contract. There is minimal periwound callus accumulation and just a light layer of slough on the surface. On the left, the surface looks better, but there is fairly significant undermining near the 11:00 portion of the wound, aiming towards his great toe. The skin overlying this area is very healthy and has a good fat layer present. No malodor or purulent drainage. 01/26/2022: The right sided wound continues to contract and has just a light layer of slough on the surface. Minimal periwound callus. On the left, he still has substantial undermining and the tissue surface is not as robust as I would like to see. No malodor or purulent drainage, but he did say he had a lot of serous drainage over the weekend. 02/02/2022: The right foot wound is about a quarter of the size that it was at his last visit. There is just a little slough on the surface and some periwound callus. On the left, the undermining persists and the tissue is still more pale, but he does not seem to have had as  much drainage over the last weekend. We are waiting on a wound VAC. 02/09/2022: His right foot has healed. He received the wound VAC, but did not bring it to clinic with him today. The lateral aspect of the left foot wound has come in a little, but he still has substantial undermining on the medial and distal portion of the wound. Still with macerated periwound callus. 02/16/2022: The wound VAC was applied last Friday. T oday, there has been tremendous improvement in the surface of the wound bed. The undermined portion of the wound has come in a bit. There is still some overhanging dead skin. Overall the wound looks much better. 02/23/2022: No significant change in the overall wound dimensions, but the wound surface continues to improve and appears healthier and more robust. There is some periwound callus accumulation and overhanging skin. No concern for infection. 03/02/2022: Although the measurements taken in clinic today were unchanged, the visual appearance of the wound looks like it is smaller and the undermining/tunneling is filling with flesh. There is less periwound tissue maceration than usual. No malodor or purulent drainage. No concern for infection. 03/09/2022: The undermining has come in by couple of millimeters. The periwound is a little bit more macerated this week. No concern for infection. 12/13; substantial wound on the left plantar foot. We are using silver collagen and a wound VAC. 03/23/2022: The undermining continues to contract. The wound surface is a little bit dry. 03/30/2022: The undermining has essentially resolved. There is still some depth at the center of the wound. The wound surface has good beefy tissue and the moisture balance is better. 04/06/2022: The wound dimensions are about the same, except that the depth is beginning to fill in. He has had more drainage this week, but there is no obvious concern for infection. 04/13/2022: He has built up a little bit of callus  around the edges of the wound, creating some undermining. Otherwise the wound looks fairly unchanged. 04/20/2022: The wound bed tissue looks very healthy and robust. Light biofilm on the surface. There is some built up wet callus around the wound edges, but no undermining. 04/27/2022: The wound surface continues to look very healthy. Although the wound dimensions measured the same, it looks smaller to me. No real  callus accumulation this week. Minimal slough on the surface. 05/04/2022: The wound measured smaller and shallower this week. We are still awaiting insurance approval for Apligraf. [name], Max ([id number]) [id number] Page 17 of 20 05/11/2022: The wound is about the same in terms of depth but measured a little bit narrower today. He has built up a rim of moist callus around the edges. He has been approved for Apligraf and we will apply that today. 05/18/2022: The wound measured a little bit smaller again today. No significant callus buildup this week. 05/25/2022: The wound was measured about the same size today, but the multiple nooks and crannies in the depths of the wound seem to be filling in. 06/01/2022: The wound dimensions are smaller today, with the exception of the depth. There is a little bit of moist callus around the edges. 06/08/2022: The wound continues to contract aside from the depth. Once again, there is moist callus around the edges. The odor is less profound this week. 3/13; patient presents for follow-up. He has had 5 applications of Apligraf and is currently not approved for more by insurance. He has also had a wound VAC in place. He has no issues or complaints today. Patient History Information obtained from Patient. Family History No family history of Cancer, Diabetes, Hereditary Spherocytosis, Hypertension, Kidney Disease, Lung Disease, Seizures, Stroke, Thyroid Problems, Tuberculosis. Social History Never smoker, Marital Status - Single,  Alcohol Use - Rarely, Drug Use - No History, Caffeine Use - Never. Medical History Eyes Denies history of Cataracts, Glaucoma, Optic Neuritis Ear/Nose/Mouth/Throat Denies history of Chronic sinus problems/congestion, Middle ear problems Hematologic/Lymphatic Denies history of Anemia, Hemophilia, Human Immunodeficiency Virus, Lymphedema, Sickle Cell Disease Respiratory Denies history of Aspiration, Asthma, Chronic Obstructive Pulmonary Disease (COPD), Pneumothorax, Sleep Apnea, Tuberculosis Cardiovascular Denies history of Angina, Arrhythmia, Congestive Heart Failure, Coronary Artery Disease, Deep Vein Thrombosis, Hypertension, Hypotension, Myocardial Infarction, Peripheral Arterial Disease, Peripheral Venous Disease, Phlebitis, Vasculitis Gastrointestinal Denies history of Cirrhosis , Colitis, Crohnoos, Hepatitis A, Hepatitis B, Hepatitis C Endocrine Patient has history of Type II Diabetes Denies history of Type I Diabetes Immunological Denies history of Lupus Erythematosus, Raynaudoos, Scleroderma Integumentary (Skin) Denies history of History of Burn Musculoskeletal Denies history of Gout, Rheumatoid Arthritis, Osteoarthritis, Osteomyelitis Neurologic Denies history of Dementia, Neuropathy, Quadriplegia, Paraplegia, Seizure Disorder Oncologic Denies history of Received Chemotherapy, Received Radiation Psychiatric Denies history of Anorexia/bulimia, Confinement Anxiety Hospitalization/Surgery History - 11/1-11/06/2018- sepsis foot infection. - 11/4-11/5 02 sats low respiratory distress. Objective Constitutional respirations regular, non-labored and within target range for patient.. Vitals Time Taken: 8:18 AM, Height: 77 in, Weight: 280 lbs, BMI: 33.2, Temperature: 97.9 F, Pulse: 97 bpm, Respiratory Rate: 18 breaths/min, Blood Pressure: 138/94 mmHg, Capillary Blood Glucose: 87 mg/dl. General Notes: glucose per pt report this am Cardiovascular 2+ dorsalis pedis/posterior  tibialis pulses. Psychiatric pleasant and cooperative. General Notes: Large open wound with increased depth the center that does not probe to bone to the plantar aspect of the left foot. Mild odor present. No signs of surrounding infection. Integumentary (Hair, Skin) Wound #3 status is Open. Original cause of wound was Trauma. The date acquired was: 10/02/2019. The wound has been in treatment 137 weeks. The wound is located on the [place]. The wound measures 2.4cm length x 2.5cm width x 1.8cm depth; 4.712cm^2 area and 8.482cm^3 volume. There is Fat Hedding, Max ([id number]) [id number] Page 18 of 20 Layer (Subcutaneous Tissue) exposed. There is no tunneling or undermining noted. There is a medium amount of serosanguineous drainage noted. Foul odor after  cleansing was noted. The wound margin is distinct with the outline attached to the wound base. There is large (67-100%) red granulation within the wound bed. There is a small (1-33%) amount of necrotic tissue within the wound bed including Adherent Slough. The periwound skin appearance had no abnormalities noted for color. The periwound skin appearance exhibited: Callus, Maceration. Periwound temperature was noted as No Abnormality. 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 At this time since there is not a skin substitute we will place collagen under the wound VAC. Will add antibiotic ointment as well. Follow-up in 1 week. Plan Follow-up Appointments: Return Appointment in 1 week. - Dr. [name] - Room 1 with [facility] Wednesday 3/20 @ 08:15 am Bathing/ Shower/ Hygiene: May shower and wash wound with soap and water. Negative Presssure Wound Therapy: Wound #3 Left,Lateral,Plantar Foot: Wound Vac to wound continuously at 159m/hg pressure Black Foam Edema  Control - Lymphedema / SCD / Other: Avoid standing for long periods of time. Exercise regularly Compression stocking or Garment 20-30 mm/Hg pressure to: - to both legs daily Off-Loading: Open toe surgical shoe to: - Both feet Additional Orders / Instructions: Follow Nutritious Diet Non Wound Condition: Apply the following to affected area as directed: - continue to apply foam donut or cushion to healed right plantar foot WOUND #3: - Foot Wound Laterality: Plantar, Left, Lateral Cleanser: Soap and Water 3 x Per Week/30 Days Discharge Instructions: May shower and wash wound with dial antibacterial soap and water prior to dressing change. Cleanser: Vashe 5.8 (oz) 3 x Per Week/30 Days Discharge Instructions: Cleanse the wound with Vashe prior to applying a clean dressing, let sit on wound for 5-10 minutesusing gauze sponges, not tissue or cotton balls. Peri-Wound Care: Skin Prep (Generic) 3 x Per Week/30 Days Discharge Instructions: Use skin prep as directed Topical: Gentamicin 3 x Per Week/30 Days Discharge Instructions: thin layer to wound bed in clinic Prim Dressing: Promogran Prisma Matrix, 4.34 (sq in) (silver collagen) (DME) (Dispense As Written) 3 x Per Week/30 Days ary Discharge Instructions: Moisten collagen with saline or hydrogel Prim Dressing: VAC 3 x Per Week/30 Days ary Secondary Dressing: Zetuvit Plus 4x8 in (Generic) 3 x Per Week/30 Days Discharge Instructions: Apply over primary dressing as directed. Secured With: Elastic Bandage 4 inch (ACE bandage) (Generic) 3 x Per Week/30 Days Discharge Instructions: Secure with ACE bandage as directed. Secured With: 41M Medipore SPublic affairs consultantSurgical T 2x10 (in/yd) (Generic) 3 x Per Week/30 Days ape Discharge Instructions: Secure with tape as directed. Com pression Wrap: Kerlix Roll 4.5x3.1 (in/yd) (Generic) 3 x Per Week/30 Days Discharge Instructions: Apply Kerlix and Coban compression as directed. 1. Collagen with antibiotic  ointment under the wound VAC 2. Follow-up in 1 week Electronic Signature(s) Signed: 06/15/2022 11:58:37 AM By: HKalman ShanDO Entered By: HKalman Shanon 06/15/2022 09:18:52 -------------------------------------------------------------------------------- HxROS Details Patient Name: Date of Service: ALewie [name] [name] 06/15/2022 8:15 A M Medical Record Number: [id number] Account Number: [id number] of Birth/Sex: Treating RN: [id number](37y.o. [place] CMali([id number] Page 19 of 20 Primary Care Provider: PSeward CarolOther Clinician: Referring Provider: Treating Provider/Extender: HHerbie Drapein Treatment: 199Information Obtained From Patient Eyes Medical History: Negative for: Cataracts; Glaucoma; Optic Neuritis Ear/Nose/Mouth/Throat Medical History: Negative for: Chronic sinus problems/congestion; Middle ear problems Hematologic/Lymphatic Medical History: Negative for: Anemia; Hemophilia; Human  Immunodeficiency Virus; Lymphedema; Sickle Cell Disease Respiratory Medical History: Negative for: Aspiration; Asthma; Chronic Obstructive Pulmonary Disease (COPD); Pneumothorax; Sleep Apnea; Tuberculosis Cardiovascular Medical History: Negative for: Angina; Arrhythmia; Congestive Heart Failure; Coronary Artery Disease; Deep Vein Thrombosis; Hypertension; Hypotension; Myocardial Infarction; Peripheral Arterial Disease; Peripheral Venous Disease; Phlebitis; Vasculitis Gastrointestinal Medical History: Negative for: Cirrhosis ; Colitis; Crohns; Hepatitis A; Hepatitis B; Hepatitis C Endocrine Medical History: Positive for: Type II Diabetes Negative for: Type I Diabetes Time with diabetes: 8 Treated with: Insulin Blood sugar tested every day: No Immunological Medical History: Negative for: Lupus Erythematosus; Raynauds; Scleroderma Integumentary (Skin) Medical  History: Negative for: History of Burn Musculoskeletal Medical History: Negative for: Gout; Rheumatoid Arthritis; Osteoarthritis; Osteomyelitis Neurologic Medical History: Negative for: Dementia; Neuropathy; Quadriplegia; Paraplegia; Seizure Disorder Oncologic Medical History: Negative for: Received Chemotherapy; Received Radiation Psychiatric Medical History: Negative for: [name] Anxiety [name], Max ([id number]) [id number] Page 20 of 20 Immunizations Pneumococcal Vaccine: Received Pneumococcal Vaccination: No Implantable Devices None Hospitalization / Surgery History Type of Hospitalization/Surgery 11/1-11/06/2018- sepsis foot infection 11/4-11/5 02 sats low respiratory distress Family and Social History Cancer: No; Diabetes: No; Hereditary Spherocytosis: No; Hypertension: No; Kidney Disease: No; Lung Disease: No; Seizures: No; Stroke: No; Thyroid Problems: No; Tuberculosis: No; Never smoker; Marital Status - Single; Alcohol Use: Rarely; Drug Use: No History; Caffeine Use: Never; Financial Concerns: No; Food, Clothing or Shelter Needs: No; Support System Lacking: No; Transportation Concerns: No Electronic Signature(s) Signed: 06/15/2022 11:58:37 AM By: [name] DO Signed: 06/15/2022 11:58:58 AM By: Baruch Gouty RN, BSN Entered By: [name] on 06/15/2022 08:54:37 -------------------------------------------------------------------------------- [place] Details Patient Name: Date of Service: [name], [name] 06/15/2022 Medical Record Number: [id number] Patient Account Number: [id number] Date of Birth/Sex: Treating RN: [birth year] (37 y.o. [name] Primary Care Provider: [name] Other Clinician: Referring Provider: Treating Provider/Extender: [name] in Treatment: 137 Diagnosis Coding ICD-10 Codes Code Description [phone] Non-pressure chronic ulcer of other  part of left foot with other specified severity E11.621 Type 2 diabetes mellitus with foot ulcer Facility Procedures : CPT4 Code: [id number] Description: [id number] - WOUND VAC-50 SQ CM OR LESS Modifier: Quantity: 1 Physician Procedures : CPT4 Code Description Modifier [id number] - WC PHYS LEVEL 3 - EST PT ICD-10 Diagnosis Description L97.528 Non-pressure chronic ulcer of other part of left foot with other specified severity E11.621 Type 2 diabetes mellitus with foot ulcer Quantity: 1 Electronic Signature(s) Signed: 06/15/2022 11:58:37 AM By: [name] DO Entered By: [name] on 06/15/2022 09:19:01

## 2022-06-22 ENCOUNTER — Encounter (HOSPITAL_BASED_OUTPATIENT_CLINIC_OR_DEPARTMENT_OTHER): Payer: BC Managed Care – PPO | Admitting: General Surgery

## 2022-06-22 DIAGNOSIS — E11621 Type 2 diabetes mellitus with foot ulcer: Secondary | ICD-10-CM | POA: Diagnosis not present

## 2022-06-23 NOTE — Progress Notes (Signed)
Max, [name] ([id number]) [id number] Page 1 of 21 Visit Report for 06/22/2022 Chief Complaint Document Details Patient Name: Date of Service: [name] 06/22/2022 8:15 A M Medical Record Number: [id number] Patient Account Number: [id number] Date of Birth/Sex: Treating RN: 04-Sep-1985 (37 y.o. M) Primary Care Provider: [name] Other Clinician: Referring Provider: Treating Provider/Extender: [name] in Treatment: [place] from: Patient Chief Complaint 01/11/2019; patient is here for review of a rather substantial wound over the left fifth plantar metatarsal head extending into the lateral part of his foot 10/24/2019; patient returns to clinic with wounds on his bilateral feet with underlying osteomyelitis biopsy-proven Electronic Signature(s) Signed: 06/22/2022 9:05:25 AM By: [name] MD FACS Entered By: [name] on 06/22/2022 09:05:25 -------------------------------------------------------------------------------- Debridement Details Patient Name: Date of Service: [name], [name] 06/22/2022 8:15 A M Medical Record Number: [id number] Patient Account Number: [id number] Date of Birth/Sex: Treating RN: 16-Jun-1985 (37 y.o. [name] Primary Care Provider: [name] Other Clinician: Referring Provider: Treating Provider/Extender: [name] in Treatment: 138 Debridement Performed for Assessment: Wound #3 Left,Lateral,Plantar Foot Performed By: Physician [name], MD Debridement Type: Debridement Severity of Tissue Pre Debridement: Fat layer exposed Level of Consciousness (Pre-procedure): Awake and Alert Pre-procedure Verification/Time Out Yes - 08:50 Taken: Start Time: 08:50 T Area Debrided (L x W): otal 2 (cm) x 2.3 (cm) = 4.6 (cm) Tissue and other material debrided: Non-Viable, Callus, Subcutaneous, Skin: Epidermis Level:  Skin/Subcutaneous Tissue Debridement Description: Excisional Instrument: Curette Specimen: Tissue Culture Number of Specimens T aken: 1 Bleeding: Minimum Hemostasis Achieved: Pressure Procedural Pain: 0 Post Procedural Pain: 0 Response to Treatment: Procedure was tolerated well Level of Consciousness (Post- Awake and Alert procedure): Post Debridement Measurements of Total Wound Length: (cm) 2 Width: (cm) 2.3 Depth: (cm) 1.6 Volume: (cm) 5.781 Character of Wound/Ulcer Post Debridement: Improved Severity of Tissue Post Debridement: Fat layer exposed Post Procedure Diagnosis Same as Pre-procedure Notes Scribed for Dr. [name] by Baruch Gouty, RN [place], [name] ([id number]) [id number] Page 2 of 21 Electronic Signature(s) Signed: 06/22/2022 9:03:36 AM By: [name] MD FACS Signed: 06/22/2022 5:57:17 PM By: Baruch Gouty RN, BSN Entered By: Baruch Gouty on 06/22/2022 08:56:15 -------------------------------------------------------------------------------- HPI Details Patient Name: Date of Service: [name], [name] 06/22/2022 8:15 A M Medical Record Number: [id number] Patient Account Number: [id number] Date of Birth/Sex: Treating RN: 1986-03-05 (38 y.o. 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 He comes in for review of a wound over the plantar fifth metatarsal head extending into the lateral part of the foot. He was followed for this previously by his podiatrist Dr. Cornelius Moras. As the patient tells his story he went to see podiatry first for a swelling he developed on the lateral part of his fifth metatarsal head in May. He states this was "open" by podiatry and the area closed.  He was followed up in June and it was again opened callus removed and it closed promptly. There were plans being made for surgery on the fifth metatarsal head in June however his blood sugar was apparently too high for anesthesia. Apparently the area was debrided and opened again in June and it is never closed since. Looking over the records from podiatry I am really not able to follow this.  It was clear when he was first seen it was before 5/14 at that point he already had a wound. By 5/17 the ulcer was resolved. I do not see anything about a procedure. On 5/28 noted to have pre-ulcerative moderate keratosis. X-ray noted 1/5 contracted toe and tailor's bunion and metatarsal deformity. On a visit date on 09/28/2018 the dorsal part of the left foot it healed and resolved. There was concern about swelling in his lower extremity he was sent to the ER.. As far as I can tell he was seen in the ER on 7/12 with an ulcer on his left foot. A DVT rule out of the left leg was negative. I do not think I have complete records from podiatry but I am not able to verify the procedures this patient states he had. He states after the last procedure the wound has never closed although I am not able to follow this in the records I have from podiatry. He has not had a recent x-ray The patient has been using Neosporin on the wound. He is wearing a Darco shoe. He is still very active up on his foot working and exercising. Past medical history; type 2 diabetes ketosis-prone, leg swelling with a negative DVT study in July. Non-smoker ABI in our clinic was 0.85 on the left 10/16; substantial wound on the plantar left fifth met head extending laterally almost to the dorsal fifth MTP. We have been using silver alginate we gave him a Darco forefoot off loader. An x-ray did not show evidence of osteomyelitis did note soft tissue emphysema which I think was due to gas tracking through an open wound. There is no doubt in my mind he  requires an MRI 10/23; MRI not booked until 3 November at the earliest this is largely due to his glucose sensor in the right arm. We have been using silver alginate. There has been an improvement 10/29; I am still not exactly sure when his MRI is booked for. He says it is the third but it is the 10th in epic. This definitely needs to be done. He is running a low-grade fever today but no other symptoms. No real improvement in the 1 02/26/2019 patient presents today for a follow-up visit here in our clinic he is last been seen in the clinic on October 29. Subsequently we were working on getting MRI to evaluate and see what exactly was going on and where we would need to go from the standpoint of whether or not he had osteomyelitis and again what treatments were going be required. Subsequently the patient ended up being admitted to the hospital on 02/07/2019 and was discharged on 02/14/2019. This is a somewhat interesting admission with a discharge diagnosis of pneumonia due to COVID-19 although he was positive for COVID-19 when tested at the urgent care but negative x2 when he was actually in the hospital. With that being said he did have acute respiratory failure with hypoxia and it was noted he also have a left foot ulceration with osteomyelitis. With that being said he did require oxygen for his pneumonia and I level 4 L. He was placed on antivirals and steroids for the COVID-19. He was also transferred to the [place] [facility] at one point. Nonetheless he did subsequently discharged home and since being home has done much better in that regard. The CT angiogram did not show any pulmonary embolism. With regard to the osteomyelitis the patient was placed on vancomycin and Zosyn while in the  hospital but has been changed to Augmentin at discharge. It was also recommended that he follow- up with wound care and podiatry. Podiatry however wanted him to see [name] according to the patient prior to them doing  anything further. His hemoglobin A1c was 9.9 as noted in the hospital. Have an MRI of the left foot performed while in the hospital on 02/04/2019. This showed evidence of septic arthritis at the fifth MTP joint and osteomyelitis involving the fifth metatarsal head and proximal phalanx. There is an overlying plantar open wound noted an abscess tracking back along the lateral aspect of the fifth metatarsal shaft. There is otherwise diffuse cellulitis and mild fasciitis without findings of polymyositis. The patient did have recently pneumonia secondary to COVID-19 I looked in the chart through epic and it does appear that the patient may need to have an additional x-ray just to ensure everything is cleared and that he has no airspace disease prior to putting him into the [name]. 03/05/2019; patient was readmitted to the clinic last week. He was hospitalized twice for a viral upper respiratory tract infection from 11/1 through 11/4 and then 11/5 through 11/12 ultimately this turned out to be Covid pneumonitis. Although he was discharged on oxygen he is not using it. He says he feels fine. He has no exercise limitation no cough no sputum. His O2 sat in our clinic today was 100% on room air. He did manage to have his MRI which showed septic arthritis at the fifth MTP joint and osteomyelitis involving the fifth metatarsal head and proximal phalanx. He received Vanco and Zosyn in the hospital and then was discharged on 2 weeks of Augmentin. I do not see any relevant cultures. He was supposed to follow-up with infectious disease but I do not see that he has an appointment. 12/8; patient saw Dr. Novella Olive of infectious disease last week. He felt that he had had adequate antibiotic therapy. He did not go to follow-up with Dr. Amalia Hailey of podiatry and I have again talked to him about the pros and cons of this. He does not want to consider a ray amputation of this time. He is aware of the risks of recurrence, migration  etc. He started HBO today and tolerated this well. He can complete the Augmentin that I gave him last week. I have looked over the lab work that Dr. Chana Bode ordered his C-reactive protein was 3.3 and his sedimentation rate was 17. The C-reactive protein is never really been measurably that high in this patient 12/15; not much change in the wound today however he has undermining along the lateral part of the foot again more extensively than last week. He has some rims of epithelialization. We have been using silver alginate. He is undergoing hyperbarics but did not dive today 12/18; in for his obligatory first total contact cast change. Unfortunately there was pus coming from the undermining area around his fifth metatarsal head. This was cultured but will preclude reapplication of a cast. He is seen in conjunction with HBO 12/24; patient had staph lugdunensis in the wound in the undermining area laterally last time. We put him on doxycycline which should have covered this. The wound looks better today. I am going to give him another week of doxycycline before reattempting the total contact cast 12/31; the patient is completing antibiotics. Hemorrhagic debris in the distal part of the wound with some undermining distally. He also had hyper granulation. Extensive debridement with a #5 curette. The infected area that was on  the lateral part of the fifth met head is closed over. I do not think he needs any more antibiotics. Patient was seen prior to HBO. Preparations for a total contact cast were made in the cast will be placed post hyperbarics [place], [name] ([id number]) [id number] Page 3 of 21 04/11/19; once again the patient arrives today without complaint. He had been in a cast all week noted that he had heavy drainage this week. This resulted in large raised areas of macerated tissue around the wound 1/14; wound bed looks better slightly smaller. Hydrofera Blue has been  changing himself. He had a heavy drainage last week which caused a lot of maceration around the wound so I took him out of a total contact cast he says the drainage is actually better this week He is seen today in conjunction with HBO 1/21; returns to clinic. He was up in [place] for a day or 2 attending a funeral. He comes back in with the wound larger and with a large area of exposed bone. He had osteomyelitis and septic arthritis of the fifth left metatarsal head while he was in hospital. He received IV antibiotics in the hospital for a prolonged period of time then 3 weeks of Augmentin. Subsequently I gave him 2 weeks of doxycycline for more superficial wound infection. When I saw this last week the wound was smaller the surface of the wound looks satisfactory. 1/28; patient missed hyperbarics today. Bone biopsy I did last time showed Enterococcus faecalis and Staphylococcus lugdunensis . He has a wide area of exposed bone. We are going to use silver alginate as of today. I had another ethical discussion with the patient. This would be recurrent osteomyelitis he is already received IV antibiotics. In this situation I think the likelihood of healing this is low. Therefore I have recommended a ray amputation and with the patient's agreement I have referred him to Dr. Doran Durand. The other issue is that his compliance with hyperbarics has been minimal because of his work schedule and given his underlying decision I am going to stop this today READMISSION 10/24/2019 MRI 09/29/2019 left foot IMPRESSION: 1. Apparent skin ulceration inferior and lateral to the 5th metatarsal base with underlying heterogeneous T2 signal and enhancement in the subcutaneous fat. Small peripherally enhancing fluid collections along the plantar and lateral aspects of the 5th metatarsal base suspicious for abscesses. 2. Interval amputation through the mid 5th metatarsal with nonspecific low-level marrow edema and  enhancement. Given the proximity to the adjacent soft tissue inflammatory changes, osteomyelitis cannot be excluded. 3. The additional bones appear unremarkable. MRI 09/29/2019 right foot IMPRESSION: 1. Soft tissue ulceration lateral to the 5th MTP joint. There is low-level T2 hyperintensity within the 4th and 5th metatarsal heads and adjacent proximal phalanges without abnormal T1 signal or cortical destruction. These findings are nonspecific and could be seen with early marrow edema, hyperemia or early osteomyelitis. No evidence of septic joint. 2. Mild tenosynovitis and synovial enhancement associated with the extensor digitorum tendons at the level of the midfoot. 3. Diffuse low-level muscular T2 hyperintensity and enhancement, most consistent with diabetic myopathy. LEFT FOOT BONE Methicillin resistant staphylococcus aureus Staphylococcus lugdunensis MIC MIC CIPROFLOXACIN >=8 RESISTANT Resistant <=0.5 SENSI... Sensitive CLINDAMYCIN <=0.25 SENS... Sensitive >=8 RESISTANT Resistant ERYTHROMYCIN >=8 RESISTANT Resistant >=8 RESISTANT Resistant GENTAMICIN <=0.5 SENSI... Sensitive <=0.5 SENSI... Sensitive Inducible Clindamycin NEGATIVE Sensitive NEGATIVE Sensitive OXACILLIN >=4 RESISTANT Resistant 2 SENSITIVE Sensitive RIFAMPIN <=0.5 SENSI... Sensitive <=0.5 SENSI... Sensitive TETRACYCLINE <=1 SENSITIVE Sensitive <=1 SENSITIVE Sensitive TRIMETH/SULFA <=10  SENSIT Sensitive <=10 SENSIT Sensitive ... Marland Kitchen.. VANCOMYCIN 1 SENSITIVE Sensitive <=0.5 SENSI... Sensitive Right foot bone . Component 3 wk ago Specimen Description BONE Special Requests RIGHT 4 METATARSAL SAMPLE B Gram Stain NO WBC SEEN NO ORGANISMS SEEN Culture RARE METHICILLIN RESISTANT STAPHYLOCOCCUS AUREUS NO ANAEROBES ISOLATED Performed at [facility], [place] [address], [place], [place] [address] Report Status 10/08/2019 FINAL Organism ID, Bacteria METHICILLIN RESISTANT STAPHYLOCOCCUS AUREUS Resulting Agency [facility] Susceptibility Methicillin resistant staphylococcus aureus MIC CIPROFLOXACIN >=8 RESISTANT Resistant CLINDAMYCIN <=0.25 SENS... Sensitive ERYTHROMYCIN >=8 RESISTANT Resistant GENTAMICIN <=0.5 SENSI... Sensitive Inducible Clindamycin NEGATIVE Sensitive OXACILLIN >=4 RESISTANT Resistant RIFAMPIN <=0.5 SENSI... Sensitive TETRACYCLINE <=1 SENSITIVE Sensitive TRIMETH/SULFA <=10 SENSIT Sensitive ... Nand, [name] ([id number]) [id number] Page 4 of 21 VANCOMYCIN 1 SENSITIVE Sensitive This is a patient we had in clinic earlier this year with a wound over his left fifth metatarsal head. 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 Doran Durand on 05/16/2019. He seems to have developed problems with wounds on his bilateral feet in June 2021 although he may have had problems earlier than this. He was in an urgent care with a right foot ulcer on 09/26/2019 and given a course of doxycycline. This was apparently after having trouble getting into see orthopedics. He was seen by podiatry on 09/28/2019 noted to have bilateral lower extremity ulcers including the left lateral fifth metatarsal base and the right subfifth met head. It was noted that had purulent drainage at that time. He required hospitalization from 6/20 through 7/2. This was because of worsening right foot wounds. He underwent bilateral operative incision and drainage and bone biopsies bilaterally. Culture results are listed above. He has been referred back to clinic by Dr. Jacqualyn Posey of podiatry. He is also followed by Dr. Megan Salon who saw him yesterday. He was discharged from hospital on Zyvox Flagyl and Levaquin and yesterday changed  to doxycycline Flagyl and Levaquin. His inflammatory markers on 6/26 showed a sedimentation rate of 129 and a C-reactive protein of 5. This is improved to 14 and 1.3 respectively. This would indicate improvement. ABIs in our clinic today were 1.23 on the right and 1.20 on the left 11/01/2019 on evaluation today patient appears to be doing fairly well in regard to the wounds on his feet at this point. Fortunately there is no signs of active infection at this time. No fevers, chills, nausea, vomiting, or diarrhea. He currently is seeing infectious disease and still under their care at this point. Subsequently he also has both wounds which she has not been using collagen on as he did not receive that in his packaging he did not call us and let us know that. Apparently that just was missed on the order. Nonetheless we will get that straightened out today. 8/9-Patient returns for bilateral foot wounds, using Prisma with hydrogel moistened dressings, and the wounds appear stable. Patient using surgical shoes, avoiding much pressure or weightbearing as much as possible 8/16; patient has bilateral foot wounds. 1 on the right lateral foot proximally the other is on the left mid lateral foot. Both required debridement of callus and thick skin around the wounds. We have been using silver collagen 8/27; patient has bilateral lateral foot wounds. The area on the left substantially  surrounded by callus and dry skin. This was removed from the wound edge. The underlying wound is small. The area on the right measured somewhat smaller today. 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 Unfortunately I did not update his antibiotics during today's visit. 9/10 I reviewed Dr. Hale Bogus last notes he felt he had completed antibiotics his inflammatory markers were reasonably well controlled. He has a small wound on the lateral left foot and a tiny area on  the right which is just above closed. He is using Hydrofera Blue with border foam he has bilateral surgical shoes 9/24; 2 week f/u. doing well. right foot is closed. left foot still undermined. 10/14; right foot remains closed at the fifth met head. The area over the base of the left fifth metatarsal has a small open area but considerable undermining towards the plantar foot. Thick callus skin around this suggests an adequate pressure relief. We have talked about this. He says he is going to go back into his cam boot. I suggested a total contact cast he did not seem enamored with this suggestion 10/26; left foot base of the fifth metatarsal. Same condition as last time. He has skin over the area with an open wound however the skin is not adherent. He went to see Dr. Earleen Newport who did an x-ray and culture of his foot I have not reviewed the x-ray but the patient was not told anything. He is on doxycycline 11/11; since the patient was last here he was in the emergency room on 10/30 he was concerned about swelling in the left foot. They did not do any cultures or x-rays. They changed his antibiotics to cephalexin. Previous culture showed group B strep. The cephalexin is appropriate as doxycycline has less than predictable coverage. Arrives in clinic today with swelling over this area under the wound. He also has a new wound on the right fifth metatarsal head 11/18; the patient has a difficult wound on the lateral aspect of the left fifth metatarsal head. The wound was almost ballotable last week I opened it slightly expecting to see purulence however there was just bleeding. I cultured this this was negative. X-ray unchanged. We are trying to get an MRI but I am not sure were going to be able to get this through his insurance. He also has an area on the right lateral fifth metatarsal head this looks healthier 12/3; the patient finally got our MRI. Surprisingly this did not show osteomyelitis. I did show the  soft tissue ulceration at the lateral plantar aspect of the fifth metatarsal base with a tiny residual 6 mm abscess overlying the superficial fascia I have tried to culture this area I have not been able to get this to grow anything. Nevertheless the protruding tissue looks aggravated. I suspect we should try to treat the underlying "abscess with broad-spectrum antibiotics. I am going to start him on Levaquin and Flagyl. He has much less edema in his legs and I am going to continue to wrap his legs and see him weekly 12/10. I started Levaquin and Flagyl on him last week. He just picked up the Flagyl apparently there was some delay. The worry is the wound on the left fifth metatarsal base which is substantial and worsening. His foot looks like he inverts at the ankle making this a weightbearing surface. Certainly no improvement in fact I think the measurements of this are somewhat worse. We have been using  12/17; he apparently just got the Levaquin yesterday this is 2 weeks after the fact. He has completed the Flagyl. The area over the left fifth metatarsal base still has protruding granulation tissue although it does not look quite as bad as it did some weeks ago. He has severe bilateral lymphedema although we have not been treating him for wounds on his legs this is definitely going to require compression. There was so much edema in the left I did not wish to put him in a total contact cast today. I am going to increase his compression from 3-4 layer. The area on the right lateral fifth met head actually look quite good and superficial. 12/23; patient arrived with callus on the right fifth met head and the substantial hyper granulated callused wound on the base of his fifth metatarsal. He says he is completing his Levaquin in 2 days but I do not think that adds up with what I gave him but I will have to double check this. We are using Hydrofera Blue on both areas. My plan is to put the left leg in a  cast the week after New Year's 04/06/2020; patient's wounds about the same. Right lateral fifth metatarsal head and left lateral foot over the base of the fifth metatarsal. There is undermining on the left lateral foot which I removed before application of total contact cast continuing with Hydrofera Blue new. Patient tells me he was seen by endocrinology today lab work was done [Dr. Kerr]. Also wondering whether he was referred to cardiology. I went over some lab work from previously does not have chronic renal failure certainly not nephrotic range proteinuria he does have very poorly controlled diabetes but this is not his most updated lab work. Hemoglobin A1c has been over 11 1/10; the patient had a considerable amount of leakage towards mid part of his left foot with macerated skin however the wound surface looks better the area on the right lateral fifth met head is better as well. I am going to change the dressing on the left foot under the total contact cast to silver alginate, continue with Hydrofera Blue on the right. 1/20; patient was in the total contact cast for 10 days. Considerable amount of drainage although the skin around the wound does not look too bad on the left foot. The area on the right fifth metatarsal head is closed. Our nursing staff reports large amount of drainage out of the left lateral foot wound 1/25; continues with copious amounts of drainage described by our intake staff. PCR culture I did last week showed E. coli and Enterococcus faecalis and low quantities. Multiple resistance genes documented including extended spectrum beta lactamase, MRSA, MRSE, quinolone, tetracycline. The wound is not quite as good this week as it was 5 days ago but about the same size 2/3; continues with copious amounts of malodorous drainage per our intake nurse. The PCR culture I did 2 weeks ago showed E. coli and low quantities of Enterococcus. There were multiple resistance genes detected. I  put Neosporin on him last week although this does not seem to have helped. The wound is slightly deeper today. Offloading continues to be an issue here although with the amount of drainage she has a total contact cast is just not going to work 2/10; moderate amount of drainage. Patient reports he cannot get his stocking on over the dressing. I told him we have to do that the nurse gave him suggestions on how to make this  work. The wound is on the bottom and lateral part of his left foot. Is cultured predominantly grew low amounts of Enterococcus, E. coli and anaerobes. There were multiple resistance genes detected including extended spectrum beta lactamase, quinolone, tetracycline. I could not think of an easy oral combination to address this so for now I am going to do topical antibiotics provided by [facility] I think the main agents here are vancomycin and an aminoglycoside. We have to be able to give him access to the wounds to get the topical antibiotic on 2/17; moderate amount of drainage this is unchanged. He has his Keystone topical antibiotic against the deep tissue culture organisms. He has been using this and changing the dressing daily. Silver alginate on the wound surface. 2/24; using Keystone antibiotic with silver alginate on the top. He had too much drainage for a total contact cast at one point although I think that is improving and I think in the next week or 2 it might be possible to replace a total contact cast I did not do this today. In general the wound surface looks healthy however he continues to have thick rims of skin and subcutaneous tissue around the wide area of the circumference which I debrided 06/04/2020 upon evaluation today patient appears to be doing well in regard to his wound. I do feel like he is showing signs of improvement. There is little bit of callus and dead tissue around the edges of the wound as well as what appears to be a little bit of a sinus tract that is  off to the side laterally I would perform debridement to clear that away today. Arviso, [name] ([id number]) [id number] Page 5 of 21 3/17; left lateral foot. The wound looks about the same as I remember. Not much depth surface looks healthy. No evidence of infection 3/25; left lateral foot. Wound surface looks about the same. Separating epithelium from the circumference. There really is no evidence of infection here however not making progress by my view 3/29; left lateral foot. Surface of the wound again looks reasonably healthy still thick skin and subcutaneous tissue around the wound margins. There is no evidence of infection. One of the concerns being brought up by the nurses has again the amount of drainage vis--vis continued use of a total contact cast 4/5; left lateral foot at roughly the base of the fifth metatarsal. Nice healthy looking granulated tissue with rims of epithelialization. The overall wound measurements are not any better but the tissue looks healthy. The only concern is the amount of drainage although he has no surrounding maceration with what we have been doing recently to absorb fluid and protect his skin. He also has lymphedema. 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 He states that the kerlix/coban under the TCC rolled down and could not get it back up. He has been using an offloading scooter and has somehow hurt his right foot using this device. This happened last week. He states that the side of his right foot developed a blister and opened. The top of his foot also has a few small open wounds he thinks is due to his socks rubbing in his shoes. He has not been using any dressings to the wound.  He  denies purulent drainage, fever/chills or erythema to the wounds. 4/22; patient presents for 1 week follow-up. He developed new wounds to the right foot that were evaluated at last clinic visit. He continues to have a total contact cast to the left leg and he reports no issues. He has been using silver collagen to the right foot wounds with no issues. He denies purulent drainage, fever/chills or erythema to the right foot wounds. He has no complaints today 4/25; patient presents for 1 week follow-up. He has a total contact cast of the left leg and reports no issues. He has been using silver alginate to the right foot wound. He denies purulent drainage, fever/chills or erythema to the right foot wounds. 5/2 patient presents for 1 week follow-up. T contact cast on the left. The wound which is on the base of the plantar foot at the base of the fifth metatarsal otal actually looks quite good and dimensions continue to gradually contract. HOWEVER the area on the right lateral fifth metatarsal head is much larger than what I remember from 2 weeks ago. Once more is he has significant levels of hypergranulation. Noteworthy that he had this same hyper granulated response on his wound on the left foot at one point in time. So much so that he I thought there was an underlying fluid collection. Based on this I think this just needs debridement. 5/9; the wound on the left actually continues to be gradually smaller with a healthy surface. Slight amount of drainage and maceration of the skin around but not too bad. However he has a large wound over the right fifth metatarsal head very much in the same configuration as his left foot wound was initially. I used silver nitrate to address the hyper granulated tissue no mechanical debridement 5/16; area on the left foot did not look as healthy this week deeper thick surrounding macerated skin and subcutaneous tissue. The area on the right foot fifth met head was about  the same The area on the right ankle that we identified last week is completely broken down into an open wound presumably a stocking rubbing issue 5/23; patient has been using a total contact cast to the left side. He has been using silver alginate underneath. He has also been using silver alginate to the right foot wounds. He has no complaints today. He denies any signs of infection. 5/31; the left-sided wound looks some better measure smaller surface granulation looks better. We have been using silver alginate under the total contact cast The large area on his right fifth met head and right dorsal foot look about the same still using silver alginate 6/6; neither side is good as I was hoping although the surface area dimensions are better. A lot of maceration on his left and right foot around the wound edge. Area on the dorsal right foot looks better. He says he was traveling. I am not sure what does the amount of maceration around the plantar wounds may be drainage issues 6/13; in general the wound surfaces look quite good on both sides. Macerated skin and raised edges around the wound required debridement although in general especially on the left the surface area seems improved. The area on the right dorsal ankle is about the same I thought this would not be such a problem to close 6/20; not much change in either wound although the one on the right looks a little better. Both wounds have thick macerated edges to the skin requiring debridements.  We have been using silver alginate. The area on the dorsal right ankle is still open I thought this would be closed. 6/28; patient comes in today with a marked deterioration in the right foot wound fifth met head. Wide area of exposed bone this is a drastic change from last time. The area on the left there we have been casting is stagnant. We have been using silver alginate in both wound areas. 7/5; bone culture I did for PCR last time was positive for  Pseudomonas, group B strep, Enterococcus and Staph aureus. There was no suggestion of methicillin resistance or ampicillin resistant genes. This was resistant to tetracycline however He comes into the clinic today with the area over his right plantar fifth metatarsal head which had been doing so well 2 weeks ago completely necrotic feeling bone. I do not know that this is going to be salvageable. The left foot wound is certainly no smaller but it has a better surface and is superficial. 7/8; patient called in this morning to say that his total contact cast was rubbing against his foot. He states he is doing fine overall. He denies signs of infection. 7/12; continued deterioration in the wound over the right fifth metatarsal head crumbling bone. This is not going to be salvageable. The patient agrees and wants to be referred to Dr. Doran Durand which we will attempt to arrange as soon as possible. I am going to continue him on antibiotics as long as that takes so I will renew those today. The area on the left foot which is the base of the fifth metatarsal continues to look somewhat better. Healthy looking tissue no depth no debridement is necessary here. 7/20; the patient was kindly seen by Dr. Doran Durand of orthopedics on 10/19/2020. 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 Towards this end we have taken him out of the total contact cast on the left we will put him in a wrap with Hydrofera Blue. As I understand things surgery is planned for 7/21 7/27; patient had his surgery last Thursday. 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 Apparently everything went well we did not still disturb that today The area on the left foot actually looks quite good. He has been much less mobile which probably explains this he did not seem to do well in the total contact cast secondary to drainage and maceration I think. We have been  using Hydrofera Blue 11/09/2020 upon evaluation today patient appears to be doing well with regard to his plantar foot ulcer on the left foot. Fortunately there is no evidence of active infection at this time. No fevers, chills, nausea, vomiting, or diarrhea. Overall I think that he is actually doing extremely well. Nonetheless I do believe that he is staying off of this more following the surgery in his right foot that is the reason the left is doing so great. 8/16; left plantar foot wound. This looks smaller than the last time I saw this he is using Hydrofera Blue. The surgical wound on the right foot is being followed by Dr. Doran Durand we did not look at this today. He has surgical shoes on both feet 8/23; left plantar foot wound not as good this week. Surrounding macerated skin and subcutaneous tissue everything looks moist and wet. I do not think he is offloading this adequately. He is using a surgical shoe Apparently the right  foot surgical wound is not open although I did not check his foot 8/31; left plantar foot lateral aspect. Much improved this week. He has no maceration. Some improvement in the surface area of the wound but most impressively the depth is come in we are using silver alginate. The patient is a Product/process development scientist. He is asked that we write him a letter so he can go back to work. I have also tried to see if we can write something Duecker, [name] ([id number]) [id number] Page 6 of 21 that will allow him to limit the amount of time that he is on his foot at work. 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 Hopefully the latter can be avoided 9/6; I believe the patient missed an appointment last week. He arrives in today with a wound looking roughly the same certainly no better. Undermining laterally and also inferiorly. We used molecuLight today in training with the patient's  permission.. We are using silver alginate 9/21 wound is measuring bigger this week although this may have to do with the aggressive circumferential debridement last week in response to the blush fluorescence on the MolecuLight. Culture I did last week showed significant MSSA and E. coli. I put him on Augmentin but he has not started it yet. We are also going to send this for compounded antibiotics at [facility]. There is no evidence of systemic infection 9/29; silver alginate. His Keystone arrived. He is completing Augmentin in 2 days. Offloading in a cam boot. Moderate drainage per our intake staff 10/5; using silver alginate. He has been using his [place]. He has completed his Augmentin. Per our intake nurse still a lot of drainage, far too much to consider a total contact cast. Wound measures about the same. He had the same undermining area that I defined last week from a roughly 11-3. I remove this today 10/12; using silver alginate he is using the [place]. He comes in for a nurse visit hence we are applying [employer] twice a week. Measuring slightly better today and less notable drainage. Extensive debridement of the wound edge last time 10/18; using topical Keystone and silver alginate and a soft cast. Wound measurements about the same. Drainage was through his soft cast. We are changing this twice a week Tuesdays and Friday 10/25; comes in with moderate drainage. Still using Keystone silver alginate and a soft cast. Wound dimensions completely the same.He has a lot of edema in the left leg he has lymphedema. Asking for [name] to consider wrapping him as he cannot get his stocking on over the soft cast 11/2; comes in with moderate to large drainage slightly smaller in terms of width we have been using [place]. His wound looks satisfactory but not much improvement 11/4; patient presents today for obligatory cast change. Has no issues or complaints today. He denies signs of infection. 11/9; patient  traveled this weekend to [place], was on the cast quite a bit. Staining of the cast with black material from his walking boot. Drainage was not quite as bad as we feared. Using silver alginate and Keystone 11/16; we do not have size for cast therefore we have been putting a soft cast on him since the change on Friday. Still a significant amount of drainage necessitating changing twice a week. We have been using the Keystone at cast changes either hard or soft as well as silver alginate Comes in the clinic with  things actually looking fairly good improvement in width. He says his offloading is about the same 02/24/2021 upon evaluation today patient actually comes back in and is doing excellent in regard to his foot ulcer this is significantly smaller even compared to the last visit. The soft cast seems to have done extremely well for him which is great news. I do not see any signs of infection minimal debridement will be needed today. 11/30; left lateral foot much improved half a centimeter improvement in surface area. No evidence of infection. He seems to be doing better with the soft cast in the TCC therefore we will continue with this. He comes back in later in the week for a change with the nurses. This is due to drainage 12/6; no improvement in dimensions. Under illumination some debris on the surface we have been using silver alginate, soft cast. If there is anything optimistic here he seems to have have less drainage 12/13. Dimensions are improved both length and width and slightly in depth. Appears to be quite healthy today. Raised edges of this thick skin and callus around the edges however. He is in a soft cast were bringing him back once for a change on Friday. Drainage is better 12/20. Dimensions are improved. He still has raised edges of thick skin and callus around the edges. We are using a soft cast 12/28; comes in today with thick callus around the wound. Using silver under alginate under a  soft cast. I do not think there is much improvement in any measurement 2023 04/06/2021; patient was put in a total contact cast. Unfortunately not much change in surface area 1/10; not much different still thick callus and skin around the edge in spite of the total contact cast. This was just debrided last week we have been using the [facility] compounded antibiotic and silver alginate under a total contact cast 1/18 the patient's wound on the left side is doing nicely. smaller HOWEVER he comes in today with a wound on the right foot laterally. blister most likely serosangquenous drainage 1/24; the patient continues to do well in terms of the plantar left foot which is continued to contract using silver alginate under the total contact cast HOWEVER the right lateral foot is bigger with denuded skin around the edges. I used pickups and a #15 scalpel to remove this this looks like the remanence of a large blister. Cannot rule out infection. Culture in this area I did last week showed Staphylococcus lugdunensis few colonies. I am going to try to address this with his [employer] antibiotic that is done so well on the left having linezolid and this should cover the staph 2/1; the patient's wound on his left foot which was the original plantar foot wound thick skin and eschar around the edges even in the total contact cast but the wound surface does not look too bad The real problem is on how his right lateral foot at roughly the base of the fifth metatarsal. The wound is completely necrotic more worrisome than that there is swelling around the edges of this. We have been using silver alginate on both wounds and Keystone on the right foot. Unfortunately I think he is going to require systemic antibiotics while we await cultures. He did not get the x-ray done that we ordered last week [lost the prescription 2/7; disappointingly in the area on the left foot which we are treating with a total contact cast is still  not closed although it is much smaller.  He continues to have a lot of callus around the wound edge. -Right lateral foot culture I did last week was negative x-ray also negative for osteomyelitis. 2/15: TCC silver alginate on the left and silver alginate on the right lateral. No real improvement in either area 05/26/2021: T oday, the wounds are roughly the same size as at his previous visit, post-debridement. He continues to endorse fairly substantial drainage, particularly on the right. He has been in a total contact cast on the left. There is still some callus surrounding this lesion. On the right, the periwound skin is quite macerated, along with surrounding callus. The center of the right-sided wound also has some dark, densely adherent material, which is very difficult to remove. 06/02/2021: Today, both wounds are slightly smaller. He has been using zinc oxide ointment around the right ulcer and the degree of maceration has improved markedly. There continues to be an area of nonviable tissue in the center of the right sided ulcer. The left-sided wound, which has been in the total contact cast. Appears clean and the degree of callus around it is less than previously. 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 He had to take the stairs and both wounds have increased in size. The left foot, which has been in a total contact cast, has developed a tunnel tracking to the lateral aspect of his foot. The nonviable tissue in the center of the right-sided ulcer remains recalcitrant to debridement. There is significant undermining surrounding the entirety of the left sided wound. 06/16/2021: The elevator at school has been fixed and the patient has been able to avoid putting as much weight on his wounds over the past week. We converted the left foot wound into a single lesion today, but despite this, the wound is actually smaller. The base is  healthy with limited periwound callus. On the right, the central necrotic area is still present. He continues to be quite macerated around the right sided wound, despite applying barrier cream. This does, however, have the benefit of softening the callus to make it more easily removable. 06/23/2021: Today, the left wound is smaller. The lateral aspect that had opened up previously is now closed. The wound base has a healthy bed of granulation tissue and minimal slough. Unfortunately, on the right, the wound is larger and continues to be fairly macerated. He has also reopened the wound at his right ankle. He thinks this is due to the gait he has adopted secondary to his total contact cast and boot. Tye, [name] ([id number]) [id number] Page 7 of 21 06/30/2021: T oday, both wounds are a little bit larger. The lateral aspect on the left has remained closed. He continues to have significant periwound maceration. The culture that I took from the right sided wound grew a population of bacteria that is not covered by his current [facility] antibiotic. The center of the right- sided wound continues to appear necrotic with nonviable fat. It probes deeper today, but does not reach bone. 07/07/2021: The periwound maceration is a little bit less today. The right lateral foot wound has some areas that appear more viable and the necrotic center also looks a little bit better. The wound on the dorsal surface of his right foot near the ankle is contracting and the surface appears healthy. The left plantar wound surface looks healthy, but there is some new undermining on the medial portion. He did get his new Keystone antibiotic and began  applying that to the right foot wound on Saturday. 07/14/2021: The intake nurse reported substantial drainage from his wounds, but the periwound skin actually looks better than is typical for him. The wound on the dorsal surface of his right foot near the ankle  is smaller and just has a small open area underneath some dried eschar. The left plantar wound surface looks healthy and there has been no significant accumulation of callus. The right lateral foot wound looks quite a bit better, with the central portion, which typically appears necrotic, looking more viable albeit pale. 07/22/2021: His left foot is extremely macerated today. The wound is about the same size. The wound on the dorsal surface of his right foot near the ankle had closed, but he traumatized it removing the dressing and there is a tiny skin tear in that location. The right lateral foot wound is bigger, but the surface appears healthy. 07/30/2021: The wound on the dorsal surface of his right foot near the ankle is closed. The right lateral foot wound again is a little bit bigger due to some undermining. The periwound skin is in better condition, however. He has been applying zinc oxide. The wound surface is a little bit dry today. On the left, he does not have the substantial maceration that we frequently see. The wound itself is smaller and has a clean surface. 08/06/2021: Both wounds seem to have deteriorated over the past week. The right lateral foot wound has a dry surface but the periwound is boggy.. Overall wound dimensions are about the same. On the left, the wound is about the same size, but there is more undermining present underneath periwound callus. 08/13/2021: The right sided wound looks about the same, but on the left there has been substantial deterioration. The undermining continues to extend under periwound callus. Once this was removed, substantial extension of the wound was present. There is no odor or purulent drainage but clearly the wounds have broken down. 08/20/2021: The wounds look about the same today. He has been out of his total contact cast and has just been changing the dressings using topical Keystone with PolyMem Ag, Kerlix and Ace bandages. The wound on the top of  his right ankle has reopened but this is quite small. There was a little bit of purulent material that I expressed when examining this wound. 08/24/2021: After the aggressive debridement I performed at his last visit, the wounds actually look a little bit better today. They are smaller with the exception of the wound on the top of his right ankle which is a little bit bigger as some more skin pulled off when he was changing his dressing. We are using topical Keystone with PolyMem Ag Kerlix and Ace bandages. 09/02/2021: There has been really no change to any of his wounds. 09/16/2021: The patient was hospitalized last week with nausea, vomiting, and dehydration. He says that while he was in the hospital, his wounds were not really addressed properly. T oday, both plantar foot wounds are larger and the periwound skin is macerated. The wound on the dorsum of his right foot has a scab on the top. The right foot now has a crater where previously he had had nonviable fat. It looks as though this simply died and fell out. The periwound callus is wet. 09/24/2021: His wounds have deteriorated somewhat since his last visit. The wound on the dorsum of his right foot near his ankle is larger and has more nonviable tissue present. The crater in his  right foot is even deeper; I cannot quite palpate or probe to bone but I am sure it is close. The wound on his left plantar foot has an odd boggy area in the center that almost feels as though it has fluid within it. He has run out of his topical Keystone antibiotic. We are using silver alginate on his wounds. 09/29/2021: He has developed a new wound on the dorsum of his left foot near his ankle. He says he thinks his wrapping is rubbing in that site. I would concur with this as the wound on his right ankle is larger. The left foot looks about the same. The right foot has the crater that was present last week. No significant slough accumulation, but his foot remains quite  swollen and warm despite oral antibiotic therapy. 10/08/2021: All of his wounds look about the same as last week. He did not start his oral antibiotics that are prescribed until just a couple of days ago; his [employer] compounded antibiotics formula has been changed and he is awaiting delivery of the new recipe. His MRI that was scheduled for earlier this week was canceled as no prior authorization had been obtained; unfortunately the tech responsible sent an email to my old [employer] email, which I no longer use nor have access to. 10/18/2021: The wounds on his bilateral dorsal feet near the ankles are both improved. They are smaller and have just some eschar and slough buildup. The left plantar wound has a fair amount of undermining, but the surface is clean. There is some periwound callus accumulation. On the right plantar foot, there is nonviable fat leading to a deep tunnel that tracks towards his dorsal medial foot. There is periwound callus and slough accumulation, as well. His right foot and leg remain swollen as compared to the left. 10/25/2021: The wounds on his bilateral dorsal feet and at the ankles have broken down somewhat. They are little bit larger than last week. The left plantar wound continues to undermine laterally but the surface is clean. The right plantar foot wound shows some decreased depth in the tunnel tracking towards his dorsal medial foot. He has not yet had the Doppler study that I ordered; it sounds like there is some confusion about the scheduling of the procedure. In addition, the MRI was denied and I have taken steps to appeal the denial. 11/24/2021: Since our last visit, Mr. Maltos was admitted to the hospital where an MRI suggested osteomyelitis. He was taken the operating room by podiatry. Bone biopsies were negative for osteomyelitis. They debrided his wounds and applied myriad matrix. He saw them last week and they removed his staples. He is here today to  continue his wound healing process. T oday, both of the dorsal foot/ankle wounds are substantially smaller. There is just a little eschar overlying the left sided wound and some eschar and slough on the right. The right plantar foot ulcer has the healthiest surface of granulation tissue that I have seen to date. A portion of the myriad matrix failed to take and was hanging loose. It appears that myriad morcells were placed into the tunnel closest to the dorsal portion of his foot. These have sloughed off. The left plantar foot ulcer is about the same size, but has a much healthier surface than in the past. Both plantar ulcers have callus and slough accumulation. 12/07/2021: Left dorsal foot/ankle wound is closed. The right dorsal foot/ankle wound is nearly closed and just has a small open area  with some eschar and slough. The right plantar foot wound has contracted quite a bit since our last visit. It has a healthy surface with just a little bit of slough accumulation and periwound callus buildup. The left plantar foot wound is about the same size but the surface appears healthy. There is a little slough and periwound callus on this side, as well. 12/15/2021: Both dorsal foot/ankle wounds are closed. The right plantar foot wound is substantially smaller than at our last visit. The tunneling that was present has nearly closed. There is just a little bit of slough buildup. The left plantar foot wound is also a little bit smaller today. The surface is the healthiest that I have ever seen it. Light slough and periwound callus accumulation on this side. 12/22/2021: The dorsal ankle/foot wounds remain closed. The right plantar foot wound continues to contract. There is still a bit of depth at the lateral portion of the wound but the surface has a good granulated appearance. The wound on the left is about the same size, the central indented portion is still adherent. It also is clean with good granulation tissue  present. The periwound skin and callus are a bit macerated, however. 12/29/2021: Both ankle wounds remain closed. The right plantar foot wound is less than half the size that it was last week. There is no depth any longer and the surface has just a little bit of slough and periwound callus. On the left, the wound is not much smaller, but the surface is healthier with good granulation tissue. There is also a little slough and periwound callus buildup. Frank, [name] ([id number]) [id number] Page 8 of 21 01/05/2022: The right plantar foot wound continues to contract and is once again about half the size as it was last week. There is just a little bit of surface slough and periwound callus. On the left, the depth of the wound has decreased and the diameter has started to contract. It is also very clean with just a little slough and biofilm present. 01/12/2022: The right plantar foot wound is smaller again today. There is just some periwound callus and slough accumulation. On the left, there is a fair amount of undermining and the surface is a little boggy, but no other significant change. 01/19/2022: The right plantar wound continues to contract. There is minimal periwound callus accumulation and just a light layer of slough on the surface. On the left, the surface looks better, but there is fairly significant undermining near the 11:00 portion of the wound, aiming towards his great toe. The skin overlying this area is very healthy and has a good fat layer present. No malodor or purulent drainage. 01/26/2022: The right sided wound continues to contract and has just a light layer of slough on the surface. Minimal periwound callus. On the left, he still has substantial undermining and the tissue surface is not as robust as I would like to see. No malodor or purulent drainage, but he did say he had a lot of serous drainage over the weekend. 02/02/2022: The right foot wound is  about a quarter of the size that it was at his last visit. There is just a little slough on the surface and some periwound callus. On the left, the undermining persists and the tissue is still more pale, but he does not seem to have had as much drainage over the last weekend. We are waiting on a wound VAC. 02/09/2022: His right foot has healed. He received the wound VAC,  but did not bring it to clinic with him today. The lateral aspect of the left foot wound has come in a little, but he still has substantial undermining on the medial and distal portion of the wound. Still with macerated periwound callus. 02/16/2022: The wound VAC was applied last Friday. T oday, there has been tremendous improvement in the surface of the wound bed. The undermined portion of the wound has come in a bit. There is still some overhanging dead skin. Overall the wound looks much better. 02/23/2022: No significant change in the overall wound dimensions, but the wound surface continues to improve and appears healthier and more robust. There is some periwound callus accumulation and overhanging skin. No concern for infection. 03/02/2022: Although the measurements taken in clinic today were unchanged, the visual appearance of the wound looks like it is smaller and the undermining/tunneling is filling with flesh. There is less periwound tissue maceration than usual. No malodor or purulent drainage. No concern for infection. 03/09/2022: The undermining has come in by couple of millimeters. The periwound is a little bit more macerated this week. No concern for infection. 12/13; substantial wound on the left plantar foot. We are using silver collagen and a wound VAC. 03/23/2022: The undermining continues to contract. The wound surface is a little bit dry. 03/30/2022: The undermining has essentially resolved. There is still some depth at the center of the wound. The wound surface has good beefy tissue and the moisture balance is  better. 04/06/2022: The wound dimensions are about the same, except that the depth is beginning to fill in. He has had more drainage this week, but there is no obvious concern for infection. 04/13/2022: He has built up a little bit of callus around the edges of the wound, creating some undermining. Otherwise the wound looks fairly unchanged. 04/20/2022: The wound bed tissue looks very healthy and robust. Light biofilm on the surface. There is some built up wet callus around the wound edges, but no undermining. 04/27/2022: The wound surface continues to look very healthy. Although the wound dimensions measured the same, it looks smaller to me. No real callus accumulation this week. Minimal slough on the surface. 05/04/2022: The wound measured smaller and shallower this week. We are still awaiting insurance approval for Apligraf. 05/11/2022: The wound is about the same in terms of depth but measured a little bit narrower today. He has built up a rim of moist callus around the edges. He has been approved for Apligraf and we will apply that today. 05/18/2022: The wound measured a little bit smaller again today. No significant callus buildup this week. 05/25/2022: The wound was measured about the same size today, but the multiple nooks and crannies in the depths of the wound seem to be filling in. 06/01/2022: The wound dimensions are smaller today, with the exception of the depth. There is a little bit of moist callus around the edges. 06/08/2022: The wound continues to contract aside from the depth. Once again, there is moist callus around the edges. The odor is less profound this week. 3/13; patient presents for follow-up. He has had 5 applications of Apligraf and is currently not approved for more by insurance. He has also had a wound VAC in place. He has no issues or complaints today. 06/22/2022: The depth of the wound has come in somewhat. There is some undermining created by skin encroaching over the top of the  wound. The wound continues to have a pungent odor. Electronic Signature(s) Signed: 06/22/2022 9:06:40  AM By: [name] MD FACS Entered By: [name] on 06/22/2022 09:06:40 -------------------------------------------------------------------------------- Physical Exam Details Patient Name: Date of Service: [name], [name] 06/22/2022 8:15 A M Medical Record Number: [id number] Patient Account Number: [id number] Date of Birth/Sex: Treating RN: 10-03-1985 (37 y.o. M) Primary Care Provider: [name] Other Clinician: Referring Provider: Treating Provider/Extender: [name] in Treatment: [address], [name] ([id number]) [id number] Page 9 of 21 Constitutional Slightly hypertensive. Slightly tachycardic. . . no acute distress. Respiratory Normal work of breathing on room air. Notes 06/22/2022: The depth of the wound has come in somewhat. There is some undermining created by skin encroaching over the top of the wound. The wound continues to have a pungent odor. Electronic Signature(s) Signed: 06/22/2022 9:12:41 AM By: [name] MD FACS Entered By: [name] on 06/22/2022 09:12:41 -------------------------------------------------------------------------------- Physician Orders Details Patient Name: Date of Service: [name], [name] 06/22/2022 8:15 A M Medical Record Number: [id number] Patient Account Number: [id number] Date of Birth/Sex: Treating RN: 17-Dec-1985 (37 y.o. 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 - Dr. [name] - Room 1 with [name] Return A Wednesday 3/27 @ 08:15 am Bathing/ Shower/ Hygiene May shower and wash wound with soap and water. Negative Presssure Wound Therapy Wound #3 Left,Lateral,Plantar Foot Wound Vac to wound continuously at 162mm/hg pressure Black Foam Edema Control - Lymphedema / SCD / Other Bilateral Lower Extremities Avoid standing for long periods of time. Exercise regularly Compression stocking or Garment 20-30 mm/Hg pressure to: - to both legs daily Off-Loading Open toe surgical shoe to: - Both feet Additional Orders / Instructions Follow Nutritious Diet Non Wound Condition pply the following to affected area as directed: - continue to apply foam donut or cushion to healed right plantar foot A Wound Treatment Wound #3 - Foot Wound Laterality: Plantar, Left, Lateral Cleanser: Soap and Water 3 x Per Week/30 Days Discharge Instructions: May shower and wash wound with dial antibacterial soap and water prior to dressing change. Cleanser: Vashe 5.8 (oz) 3 x Per Week/30 Days Discharge Instructions: Cleanse the wound with Vashe prior to applying a clean dressing, let sit on wound for 5-10 minutesusing gauze sponges, not tissue or cotton balls. Peri-Wound Care: Skin Prep (DME) (Generic) 3 x Per Week/30 Days Discharge Instructions: Use skin prep as directed Topical: Gentamicin 3 x Per Week/30 Days Discharge Instructions: thin layer to wound bed in clinic [place], [name] ([id number]) [id number] Page 10 of 21 Prim Dressing: Promogran Prisma Matrix, 4.34 (sq in) (silver collagen) (Dispense As Written) 3 x Per Week/30 Days ary Discharge Instructions: Moisten collagen with saline or hydrogel Prim Dressing: VAC ary 3 x Per Week/30 Days Secondary Dressing: Zetuvit Absorbent Pad, 4x4 (in/in) (DME) (Dispense As Written) 3 x Per Week/30 Days Secured With: Elastic Bandage 4 inch (ACE bandage) (Generic) 3 x Per Week/30 Days Discharge  Instructions: Secure with ACE bandage as directed. Secured With: 42M Medipore Public affairs consultant Surgical T 2x10 (in/yd) (Generic) 3 x Per Week/30 Days ape Discharge Instructions: Secure with tape as directed. Compression Wrap: Kerlix Roll 4.5x3.1 (in/yd) (DME) (Generic) 3 x Per Week/30 Days Discharge Instructions: Apply Kerlix and Coban compression as directed. Laboratory naerobe culture (MICRO) -  left foot Bacteria identified in Unspecified specimen by A LOINC Code: [id number] Convenience Name: Anaerobic culture Electronic Signature(s) Signed: 06/22/2022 9:48:35 AM By: [name] MD FACS Previous Signature: 06/22/2022 9:03:36 AM Version By: [name] MD FACS Entered By: [name] on 06/22/2022 09:13:31 -------------------------------------------------------------------------------- Problem List Details Patient Name: Date of Service: [name], [name] 06/22/2022 8:15 A M Medical Record Number: [id number] Patient Account Number: [id number] Date of Birth/Sex: Treating RN: Feb 10, 1986 (37 y.o. [name] Primary Care Provider: [name] Other Clinician: Referring Provider: Treating Provider/Extender: [name] in Treatment: 138 Active Problems ICD-10 Encounter Code Description Active Date MDM Diagnosis L97.528 Non-pressure chronic ulcer of other part of left foot with other specified 10/24/2019 No Yes severity E11.621 Type 2 diabetes mellitus with foot ulcer 10/24/2019 No Yes Inactive Problems ICD-10 Code Description Active Date Inactive Date L97.518 Non-pressure chronic ulcer of other part of right foot with other specified severity 07/14/2020 07/14/2020 L97.518 Non-pressure chronic ulcer of other part of right foot with other specified severity 10/24/2019 10/24/2019 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, right ankle and foot 10/24/2019 10/24/2019 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 08/10/2020  08/10/2020 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, left ankle and foot 10/24/2019 10/24/2019 Peltzer, [name] ([id number]) [phone].pdf Page 11 of 21 L97.322 Non-pressure chronic ulcer of left ankle with fat layer exposed 09/29/2021 09/29/2021 B95.62 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 10/24/2019 10/24/2019 classified elsewhere Resolved Problems Electronic Signature(s) Signed: 06/22/2022 9:04:52 AM By: [name] MD FACS Previous Signature: 06/22/2022 9:02:12 AM Version By: [name] MD FACS Entered By: [name] on 06/22/2022 09:04:52 -------------------------------------------------------------------------------- Progress Note Details Patient Name: Date of Service: [name], [name] 06/22/2022 8:15 A M Medical Record Number: [id number] Patient Account Number: [id number] Date of Birth/Sex: Treating RN: 23-Jan-1986 (37 y.o. 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 He comes in for review of a wound over the plantar fifth metatarsal head extending into the lateral part of the foot. He was followed for this previously by his podiatrist Dr. Cornelius Moras. As the patient tells his story he went to see podiatry first for a swelling he developed on the lateral part of his fifth metatarsal head in May. He states this was "open" by  podiatry and the area closed. He was followed up in June and it was again opened callus removed and it closed promptly. There were plans being made for surgery on the fifth metatarsal head in June however his blood sugar was apparently too high for anesthesia. Apparently the area was debrided and opened again in June and it is never closed since. Looking over the records from podiatry I am really not able to follow this. It was clear when he was first seen it was before 5/14 at that point he already had a wound. By 5/17 the ulcer was resolved. I do not see anything about a procedure. On 5/28 noted to have pre-ulcerative moderate keratosis. X-ray noted 1/5 contracted toe and tailor's bunion  and metatarsal deformity. On a visit date on 09/28/2018 the dorsal part of the left foot it healed and resolved. There was concern about swelling in his lower extremity he was sent to the ER.. As far as I can tell he was seen in the ER on 7/12 with an ulcer on his left foot. A DVT rule out of the left leg was negative. I do not think I have complete records from podiatry but I am not able to verify the procedures this patient states he had. He states after the last procedure the wound has never closed although I am not able to follow this in the records I have from podiatry. He has not had a recent x-ray The patient has been using Neosporin on the wound. He is wearing a Darco shoe. He is still very active up on his foot working and exercising. Past medical history; type 2 diabetes ketosis-prone, leg swelling with a negative DVT study in July. Non-smoker ABI in our clinic was 0.85 on the left 10/16; substantial wound on the plantar left fifth met head extending laterally almost to the dorsal fifth MTP. We have been using silver alginate we gave him a Darco forefoot off loader. An x-ray did not show evidence of osteomyelitis did note soft tissue emphysema which I think was due to gas tracking through an open wound.  There is no doubt in my mind he requires an MRI 10/23; MRI not booked until 3 November at the earliest this is largely due to his glucose sensor in the right arm. We have been using silver alginate. There has been an improvement 10/29; I am still not exactly sure when his MRI is booked for. He says it is the third but it is the 10th in epic. This definitely needs to be done. He is running a low-grade fever today but no other symptoms. No real improvement in the 1 02/26/2019 patient presents today for a follow-up visit here in our clinic he is last been seen in the clinic on October 29. Subsequently we were working on getting MRI to evaluate and see what exactly was going on and where we would need to go from the standpoint of whether or not he had osteomyelitis and again what treatments were going be required. Subsequently the patient ended up being admitted to the hospital on 02/07/2019 and was discharged on 02/14/2019. This is a somewhat interesting admission with a discharge diagnosis of pneumonia due to COVID-19 although he was positive for COVID-19 when tested at the urgent care but negative x2 when he was actually in the hospital. With that being said he did have acute respiratory failure with hypoxia and it was noted he also have a left foot ulceration with osteomyelitis. With that being said he did require oxygen for his pneumonia and I level 4 L. He was placed on antivirals and steroids for the COVID-19. He was also transferred to the [facility] at one point. Nonetheless he did subsequently discharged home and since being home has done much better in that regard. The CT angiogram did not show any pulmonary embolism. With regard to the osteomyelitis the patient was placed on vancomycin and Zosyn while in the hospital but has been changed to Augmentin at discharge. It was also recommended that he follow- up with wound care and podiatry. Podiatry however wanted him to see [name] according to  the patient prior to them doing anything further. His hemoglobin A1c was 9.9 as noted in the hospital.  Have an MRI of the left foot performed while in the hospital on 02/04/2019. 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 There is an overlying plantar open wound noted an abscess tracking back along the lateral aspect of the fifth metatarsal shaft. There is otherwise diffuse cellulitis and mild fasciitis without findings of polymyositis. The patient did have recently pneumonia secondary to COVID-19 I looked in the chart through epic and it does appear that the patient may need to have an additional x-ray just to ensure everything is cleared and that he has no airspace disease prior to putting him into the [name]. Schickling, [name] ([id number]) [id number] Page 12 of 21 03/05/2019; patient was readmitted to the clinic last week. He was hospitalized twice for a viral upper respiratory tract infection from 11/1 through 11/4 and then 11/5 through 11/12 ultimately this turned out to be Covid pneumonitis. Although he was discharged on oxygen he is not using it. He says he feels fine. He has no exercise limitation no cough no sputum. His O2 sat in our clinic today was 100% on room air. 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 He received Vanco and Zosyn in the hospital and then was discharged on 2 weeks of Augmentin. I do not see any relevant cultures. He was supposed to follow-up with infectious disease but I do not see that he has an appointment. 12/8; patient saw Dr. Novella Olive of infectious disease last week. He felt that he had had adequate antibiotic therapy. He did not go to follow-up with Dr. Amalia Hailey of podiatry and I have again talked to him about the pros and cons of  this. He does not want to consider a ray amputation of this time. He is aware of the risks of recurrence, migration etc. He started HBO today and tolerated this well. He can complete the Augmentin that I gave him last week. I have looked over the lab work that Dr. Chana Bode ordered his C-reactive protein was 3.3 and his sedimentation rate was 17. The C-reactive protein is never really been measurably that high in this patient 12/15; not much change in the wound today however he has undermining along the lateral part of the foot again more extensively than last week. He has some rims of epithelialization. We have been using silver alginate. He is undergoing hyperbarics but did not dive today 12/18; in for his obligatory first total contact cast change. Unfortunately there was pus coming from the undermining area around his fifth metatarsal head. This was cultured but will preclude reapplication of a cast. He is seen in conjunction with HBO 12/24; patient had staph lugdunensis in the wound in the undermining area laterally last time. We put him on doxycycline which should have covered this. The wound looks better today. I am going to give him another week of doxycycline before reattempting the total contact cast 12/31; the patient is completing antibiotics. Hemorrhagic debris in the distal part of the wound with some undermining distally. He also had hyper granulation. Extensive debridement with a #5 curette. The infected area that was on the lateral part of the fifth met head is closed over. I do not think he needs any more antibiotics. Patient was seen prior to HBO. Preparations for a total contact cast were made in the cast will be placed post hyperbarics  04/11/19; once again the patient arrives today without complaint. He had been in a cast all week noted that he had heavy drainage this week. This resulted in large raised areas of macerated tissue around the wound 1/14; wound bed looks better slightly  smaller. Hydrofera Blue has been changing himself. He had a heavy drainage last week which caused a lot of maceration around the wound so I took him out of a total contact cast he says the drainage is actually better this week He is seen today in conjunction with HBO 1/21; returns to clinic. He was up in [place] for a day or 2 attending a funeral. He comes back in with the wound larger and with a large area of exposed bone. He had osteomyelitis and septic arthritis of the fifth left metatarsal head while he was in hospital. He received IV antibiotics in the hospital for a prolonged period of time then 3 weeks of Augmentin. Subsequently I gave him 2 weeks of doxycycline for more superficial wound infection. When I saw this last week the wound was smaller the surface of the wound looks satisfactory. 1/28; patient missed hyperbarics today. Bone biopsy I did last time showed Enterococcus faecalis and Staphylococcus lugdunensis . He has a wide area of exposed bone. We are going to use silver alginate as of today. I had another ethical discussion with the patient. 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 In this situation I think the likelihood of healing this is low. Therefore I have recommended a ray amputation and with the patient's agreement I have referred him to Dr. Doran Durand. The other issue is that his compliance with hyperbarics has been minimal because of his work schedule and given his underlying decision I am going to stop this today READMISSION 10/24/2019 MRI 09/29/2019 left foot IMPRESSION: 1. Apparent skin ulceration inferior and lateral to the 5th metatarsal base with underlying heterogeneous T2 signal and enhancement in the subcutaneous fat. Small peripherally enhancing fluid collections along the plantar and lateral aspects of the 5th metatarsal base suspicious for abscesses. 2. Interval amputation through the mid 5th metatarsal with nonspecific  low-level marrow edema and enhancement. Given the proximity to the adjacent soft tissue inflammatory changes, osteomyelitis cannot be excluded. 3. The additional bones appear unremarkable. MRI 09/29/2019 right foot IMPRESSION: 1. Soft tissue ulceration lateral to the 5th MTP joint. There is low-level T2 hyperintensity within the 4th and 5th metatarsal heads and adjacent proximal phalanges without abnormal T1 signal or cortical destruction. These findings are nonspecific and could be seen with early marrow edema, hyperemia or early osteomyelitis. No evidence of septic joint. 2. Mild tenosynovitis and synovial enhancement associated with the extensor digitorum tendons at the level of the midfoot. 3. Diffuse low-level muscular T2 hyperintensity and enhancement, most consistent with diabetic myopathy. LEFT FOOT BONE Methicillin resistant staphylococcus aureus Staphylococcus lugdunensis MIC MIC CIPROFLOXACIN >=8 RESISTANT Resistant <=0.5 SENSI... Sensitive CLINDAMYCIN <=0.25 SENS... Sensitive >=8 RESISTANT Resistant ERYTHROMYCIN >=8 RESISTANT Resistant >=8 RESISTANT Resistant GENTAMICIN <=0.5 SENSI... Sensitive <=0.5 SENSI... Sensitive Inducible Clindamycin NEGATIVE Sensitive NEGATIVE Sensitive OXACILLIN >=4 RESISTANT Resistant 2 SENSITIVE Sensitive RIFAMPIN <=0.5 SENSI... Sensitive <=0.5 SENSI... Sensitive TETRACYCLINE <=1 SENSITIVE Sensitive <=1 SENSITIVE Sensitive TRIMETH/SULFA <=10 SENSIT Sensitive <=10 SENSIT Sensitive ... Marland Kitchen.. VANCOMYCIN 1 SENSITIVE Sensitive <=0.5 SENSI... Sensitive Right foot bone . Component 3 wk ago Specimen Description BONE Special Requests RIGHT 4 METATARSAL SAMPLE B Gram Stain NO WBC SEEN Needs, [name] ([id number]) [phone].pdf Page 13 of 21 NO ORGANISMS SEEN Culture RARE  METHICILLIN RESISTANT STAPHYLOCOCCUS AUREUS NO ANAEROBES ISOLATED Performed at [facility], [place] [address] [place], [place] [address] Report Status  10/08/2019 FINAL Organism ID, Bacteria METHICILLIN RESISTANT STAPHYLOCOCCUS AUREUS Resulting Agency [facility] Susceptibility Methicillin resistant staphylococcus aureus MIC CIPROFLOXACIN >=8 RESISTANT Resistant CLINDAMYCIN <=0.25 SENS... Sensitive ERYTHROMYCIN >=8 RESISTANT Resistant GENTAMICIN <=0.5 SENSI... Sensitive Inducible Clindamycin NEGATIVE Sensitive OXACILLIN >=4 RESISTANT Resistant RIFAMPIN <=0.5 SENSI... Sensitive TETRACYCLINE <=1 SENSITIVE Sensitive TRIMETH/SULFA <=10 SENSIT Sensitive ... VANCOMYCIN 1 SENSITIVE Sensitive This is a patient we had in clinic earlier this year with a wound over his left fifth metatarsal head. 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 Doran Durand on 05/16/2019. He seems to have developed problems with wounds on his bilateral feet in June 2021 although he may have had problems earlier than this. He was in an urgent care with a right foot ulcer on 09/26/2019 and given a course of doxycycline. This was apparently after having trouble getting into see orthopedics. He was seen by podiatry on 09/28/2019 noted to have bilateral lower extremity ulcers including the left lateral fifth metatarsal base and the right subfifth met head. It was noted that had purulent drainage at that time. He required hospitalization from 6/20 through 7/2. This was because of worsening right foot wounds. He underwent bilateral operative incision and drainage and bone biopsies bilaterally. Culture results are listed above. He has been referred back to clinic by Dr. Jacqualyn Posey of podiatry. He is also followed by Dr. Megan Salon who saw him yesterday. He was discharged from hospital on Zyvox Flagyl and Levaquin and  yesterday changed to doxycycline Flagyl and Levaquin. His inflammatory markers on 6/26 showed a sedimentation rate of 129 and a C-reactive protein of 5. This is improved to 14 and 1.3 respectively. This would indicate improvement. ABIs in our clinic today were 1.23 on the right and 1.20 on the left 11/01/2019 on evaluation today patient appears to be doing fairly well in regard to the wounds on his feet at this point. Fortunately there is no signs of active infection at this time. No fevers, chills, nausea, vomiting, or diarrhea. He currently is seeing infectious disease and still under their care at this point. Subsequently he also has both wounds which she has not been using collagen on as he did not receive that in his packaging he did not call us and let us know that. Apparently that just was missed on the order. Nonetheless we will get that straightened out today. 8/9-Patient returns for bilateral foot wounds, using Prisma with hydrogel moistened dressings, and the wounds appear stable. Patient using surgical shoes, avoiding much pressure or weightbearing as much as possible 8/16; patient has bilateral foot wounds. 1 on the right lateral foot proximally the other is on the left mid lateral foot. Both required debridement of callus and thick skin around the wounds. We have been using silver collagen 8/27; patient has bilateral lateral foot wounds. The area on the left substantially surrounded by callus and dry skin. This was removed from the wound edge. The underlying wound is small. The area on the right measured somewhat smaller today. 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 Unfortunately I did not update his  antibiotics during today's visit. 9/10 I reviewed Dr. Hale Bogus last notes he felt he had completed antibiotics his inflammatory markers were reasonably well controlled. He has a small wound on the lateral left foot and  a tiny area on the right which is just above closed. He is using Hydrofera Blue with border foam he has bilateral surgical shoes 9/24; 2 week f/u. doing well. right foot is closed. left foot still undermined. 10/14; right foot remains closed at the fifth met head. The area over the base of the left fifth metatarsal has a small open area but considerable undermining towards the plantar foot. Thick callus skin around this suggests an adequate pressure relief. We have talked about this. He says he is going to go back into his cam boot. I suggested a total contact cast he did not seem enamored with this suggestion 10/26; left foot base of the fifth metatarsal. Same condition as last time. He has skin over the area with an open wound however the skin is not adherent. He went to see Dr. Earleen Newport who did an x-ray and culture of his foot I have not reviewed the x-ray but the patient was not told anything. He is on doxycycline 11/11; since the patient was last here he was in the emergency room on 10/30 he was concerned about swelling in the left foot. They did not do any cultures or x-rays. They changed his antibiotics to cephalexin. Previous culture showed group B strep. The cephalexin is appropriate as doxycycline has less than predictable coverage. Arrives in clinic today with swelling over this area under the wound. He also has a new wound on the right fifth metatarsal head 11/18; the patient has a difficult wound on the lateral aspect of the left fifth metatarsal head. The wound was almost ballotable last week I opened it slightly expecting to see purulence however there was just bleeding. I cultured this this was negative. X-ray unchanged. We are trying to get an MRI but I am not sure were going to be able to get this through his insurance. He also has an area on the right lateral fifth metatarsal head this looks healthier 12/3; the patient finally got our MRI. Surprisingly this did not show osteomyelitis. I  did show the soft tissue ulceration at the lateral plantar aspect of the fifth metatarsal base with a tiny residual 6 mm abscess overlying the superficial fascia I have tried to culture this area I have not been able to get this to grow anything. Nevertheless the protruding tissue looks aggravated. I suspect we should try to treat the underlying "abscess with broad-spectrum antibiotics. I am going to start him on Levaquin and Flagyl. He has much less edema in his legs and I am going to continue to wrap his legs and see him weekly 12/10. I started Levaquin and Flagyl on him last week. He just picked up the Flagyl apparently there was some delay. The worry is the wound on the left fifth metatarsal base which is substantial and worsening. His foot looks like he inverts at the ankle making this a weightbearing surface. Certainly no improvement in fact I think the measurements of this are somewhat worse. We have been using 12/17; he apparently just got the Levaquin yesterday this is 2 weeks after the fact. He has completed the Flagyl. The area over the left fifth metatarsal base still has protruding granulation tissue although it does not look quite as bad as it did some weeks ago. He has severe  bilateral lymphedema although we have not been treating him for wounds on his legs this is definitely going to require compression. There was so much edema in the left I did not wish to put him in a total contact cast today. I am going to increase his compression from 3-4 layer. The area on the right lateral fifth met head actually look quite good and superficial. 12/23; patient arrived with callus on the right fifth met head and the substantial hyper granulated callused wound on the base of his fifth metatarsal. He says he is completing his Levaquin in 2 days but I do not think that adds up with what I gave him but I will have to double check this. We are using Hydrofera Blue on both areas. My plan is to put the  left leg in a cast the week after New Year's 04/06/2020; patient's wounds about the same. Right lateral fifth metatarsal head and left lateral foot over the base of the fifth metatarsal. There is undermining on the left lateral foot which I removed before application of total contact cast continuing with Hydrofera Blue new. Patient tells me he was seen by endocrinology today lab work was done [Dr. Kerr]. Also wondering whether he was referred to cardiology. I went over some lab work from previously does not have chronic renal failure certainly not nephrotic range proteinuria he does have very poorly controlled diabetes but this is not his most updated lab work. Hemoglobin A1c has been over 11 1/10; the patient had a considerable amount of leakage towards mid part of his left foot with macerated skin however the wound surface looks better the area on the right lateral fifth met head is better as well. I am going to change the dressing on the left foot under the total contact cast to silver alginate, continue with Hydrofera Blue on the right. 1/20; patient was in the total contact cast for 10 days. Considerable amount of drainage although the skin around the wound does not look too bad on the left Frayne, [name] ([id number]) [phone].pdf Page 14 of 21 foot. The area on the right fifth metatarsal head is closed. Our nursing staff reports large amount of drainage out of the left lateral foot wound 1/25; continues with copious amounts of drainage described by our intake staff. PCR culture I did last week showed E. coli and Enterococcus faecalis and low quantities. Multiple resistance genes documented including extended spectrum beta lactamase, MRSA, MRSE, quinolone, tetracycline. The wound is not quite as good this week as it was 5 days ago but about the same size 2/3; continues with copious amounts of malodorous drainage per our intake nurse. The PCR culture I did 2 weeks ago  showed E. coli and low quantities of Enterococcus. There were multiple resistance genes detected. I put Neosporin on him last week although this does not seem to have helped. The wound is slightly deeper today. Offloading continues to be an issue here although with the amount of drainage she has a total contact cast is just not going to work 2/10; moderate amount of drainage. Patient reports he cannot get his stocking on over the dressing. I told him we have to do that the nurse gave him suggestions on how to make this work. The wound is on the bottom and lateral part of his left foot. Is cultured predominantly grew low amounts of Enterococcus, E. coli and anaerobes. There were multiple resistance genes detected including extended spectrum beta lactamase, quinolone, tetracycline. I could not  think of an easy oral combination to address this so for now I am going to do topical antibiotics provided by [facility] I think the main agents here are vancomycin and an aminoglycoside. We have to be able to give him access to the wounds to get the topical antibiotic on 2/17; moderate amount of drainage this is unchanged. He has his Keystone topical antibiotic against the deep tissue culture organisms. He has been using this and changing the dressing daily. Silver alginate on the wound surface. 2/24; using Keystone antibiotic with silver alginate on the top. He had too much drainage for a total contact cast at one point although I think that is improving and I think in the next week or 2 it might be possible to replace a total contact cast I did not do this today. In general the wound surface looks healthy however he continues to have thick rims of skin and subcutaneous tissue around the wide area of the circumference which I debrided 06/04/2020 upon evaluation today patient appears to be doing well in regard to his wound. I do feel like he is showing signs of improvement. There is little bit of callus and dead  tissue around the edges of the wound as well as what appears to be a little bit of a sinus tract that is off to the side laterally I would perform debridement to clear that away today. 3/17; left lateral foot. The wound looks about the same as I remember. Not much depth surface looks healthy. No evidence of infection 3/25; left lateral foot. Wound surface looks about the same. Separating epithelium from the circumference. There really is no evidence of infection here however not making progress by my view 3/29; left lateral foot. Surface of the wound again looks reasonably healthy still thick skin and subcutaneous tissue around the wound margins. There is no evidence of infection. One of the concerns being brought up by the nurses has again the amount of drainage vis--vis continued use of a total contact cast 4/5; left lateral foot at roughly the base of the fifth metatarsal. Nice healthy looking granulated tissue with rims of epithelialization. The overall wound measurements are not any better but the tissue looks healthy. The only concern is the amount of drainage although he has no surrounding maceration with what we have been doing recently to absorb fluid and protect his skin. He also has lymphedema. 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 He states that the kerlix/coban under the TCC rolled down and could not get it back up. He has been using an offloading scooter and has somehow hurt his right foot using this device. This happened last week. He states that the side of his right foot developed a blister and opened. The top of his foot also has a few small open wounds he thinks is due to his socks rubbing in his shoes. He has not been using any  dressings to the wound. He denies purulent drainage, fever/chills or erythema to the wounds. 4/22; patient presents for 1 week follow-up. He developed new wounds to the right foot that were evaluated at last clinic visit. He continues to have a total contact cast to the left leg and he reports no issues.  He has been using silver collagen to the right foot wounds with no issues. He denies purulent drainage, fever/chills or erythema to the right foot wounds. He has no complaints today 4/25; patient presents for 1 week follow-up. He has a total contact cast of the left leg and reports no issues. He has been using silver alginate to the right foot wound. He denies purulent drainage, fever/chills or erythema to the right foot wounds. 5/2 patient presents for 1 week follow-up. T contact cast on the left. The wound which is on the base of the plantar foot at the base of the fifth metatarsal otal actually looks quite good and dimensions continue to gradually contract. HOWEVER the area on the right lateral fifth metatarsal head is much larger than what I remember from 2 weeks ago. Once more is he has significant levels of hypergranulation. Noteworthy that he had this same hyper granulated response on his wound on the left foot at one point in time. So much so that he I thought there was an underlying fluid collection. Based on this I think this just needs debridement. 5/9; the wound on the left actually continues to be gradually smaller with a healthy surface. Slight amount of drainage and maceration of the skin around but not too bad. However he has a large wound over the right fifth metatarsal head very much in the same configuration as his left foot wound was initially. I used silver nitrate to address the hyper granulated tissue no mechanical debridement 5/16; area on the left foot did not look as healthy this week deeper thick surrounding macerated skin and subcutaneous tissue. oo The area on the right  foot fifth met head was about the same oo The area on the right ankle that we identified last week is completely broken down into an open wound presumably a stocking rubbing issue 5/23; patient has been using a total contact cast to the left side. He has been using silver alginate underneath. He has also been using silver alginate to the right foot wounds. He has no complaints today. He denies any signs of infection. 5/31; the left-sided wound looks some better measure smaller surface granulation looks better. We have been using silver alginate under the total contact cast oo The large area on his right fifth met head and right dorsal foot look about the same still using silver alginate 6/6; neither side is good as I was hoping although the surface area dimensions are better. A lot of maceration on his left and right foot around the wound edge. Area on the dorsal right foot looks better. He says he was traveling. I am not sure what does the amount of maceration around the plantar wounds may be drainage issues 6/13; in general the wound surfaces look quite good on both sides. Macerated skin and raised edges around the wound required debridement although in general especially on the left the surface area seems improved. oo The area on the right dorsal ankle is about the same I thought this would not be such a problem to close 6/20; not much change in either wound although the one on the right looks a little better. Both wounds have thick macerated edges to the skin requiring debridements. We have been using silver alginate. The area on the dorsal right ankle is still open I thought this would be closed. 6/28; patient comes in today with a marked deterioration in the right foot wound fifth met head. Wide area of exposed bone this is  a drastic change from last time. The area on the left there we have been casting is stagnant. We have been using silver alginate in both wound areas. 7/5; bone culture I  did for PCR last time was positive for Pseudomonas, group B strep, Enterococcus and Staph aureus. There was no suggestion of methicillin resistance or ampicillin resistant genes. This was resistant to tetracycline however He comes into the clinic today with the area over his right plantar fifth metatarsal head which had been doing so well 2 weeks ago completely necrotic feeling bone. I do not know that this is going to be salvageable. The left foot wound is certainly no smaller but it has a better surface and is superficial. 7/8; patient called in this morning to say that his total contact cast was rubbing against his foot. He states he is doing fine overall. He denies signs of infection. 7/12; continued deterioration in the wound over the right fifth metatarsal head crumbling bone. This is not going to be salvageable. The patient agrees and wants to be referred to Dr. Doran Durand which we will attempt to arrange as soon as possible. I am going to continue him on antibiotics as long as that takes so I will renew those today. The area on the left foot which is the base of the fifth metatarsal continues to look somewhat better. Healthy looking tissue no depth no debridement is necessary here. 7/20; the patient was kindly seen by Dr. Doran Durand of orthopedics on 10/19/2020. 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 Towards this end we have taken him out of the total contact cast on the left we will put him in a wrap with Hydrofera Blue. Bergh, [name] ([id number]) [id number] Page 15 of 21 As I understand things surgery is planned for 7/21 7/27; patient had his surgery last Thursday. 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 Apparently everything went well we did not still disturb that today The area on the left foot actually looks quite good. He has been much less mobile which probably  explains this he did not seem to do well in the total contact cast secondary to drainage and maceration I think. 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 Fortunately there is no evidence of active infection at this time. No fevers, chills, nausea, vomiting, or diarrhea. Overall I think that he is actually doing extremely well. Nonetheless I do believe that he is staying off of this more following the surgery in his right foot that is the reason the left is doing so great. 8/16; left plantar foot wound. This looks smaller than the last time I saw this he is using Hydrofera Blue. The surgical wound on the right foot is being followed by Dr. Doran Durand we did not look at this today. He has surgical shoes on both feet 8/23; left plantar foot wound not as good this week. Surrounding macerated skin and subcutaneous tissue everything looks moist and wet. I do not think he is offloading this adequately. He is using a surgical shoe Apparently the right foot surgical wound is not open although I did not check his foot 8/31; left plantar foot lateral aspect. Much improved this week. He has no maceration. Some improvement in the surface area of the wound but most  impressively the depth is come in we are using silver alginate. The patient is a Product/process development scientist. He is asked that we write him a letter so he can go back to work. I have also tried to see if we can write something that will allow him to limit the amount of time that he is on his foot at work. 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 Hopefully the latter can be avoided 9/6; I believe the patient missed an appointment last week. He arrives in today with a wound looking roughly the same certainly no better. Undermining laterally and also inferiorly. We used  molecuLight today in training with the patient's permission.. We are using silver alginate 9/21 wound is measuring bigger this week although this may have to do with the aggressive circumferential debridement last week in response to the blush fluorescence on the MolecuLight. Culture I did last week showed significant MSSA and E. coli. I put him on Augmentin but he has not started it yet. We are also going to send this for compounded antibiotics at [facility]. There is no evidence of systemic infection 9/29; silver alginate. His Keystone arrived. He is completing Augmentin in 2 days. Offloading in a cam boot. Moderate drainage per our intake staff 10/5; using silver alginate. He has been using his Schneider. He has completed his Augmentin. Per our intake nurse still a lot of drainage, far too much to consider a total contact cast. Wound measures about the same. He had the same undermining area that I defined last week from a roughly 11-3. I remove this today 10/12; using silver alginate he is using the [employer]. He comes in for a nurse visit hence we are applying [employer] twice a week. Measuring slightly better today and less notable drainage. Extensive debridement of the wound edge last time 10/18; using topical Keystone and silver alginate and a soft cast. Wound measurements about the same. Drainage was through his soft cast. We are changing this twice a week Tuesdays and Friday 10/25; comes in with moderate drainage. Still using Keystone silver alginate and a soft cast. Wound dimensions completely the same.He has a lot of edema in the left leg he has lymphedema. Asking for [name] to consider wrapping him as he cannot get his stocking on over the soft cast 11/2; comes in with moderate to large drainage slightly smaller in terms of width we have been using [employer]. His wound looks satisfactory but not much improvement 11/4; patient presents today for obligatory cast change. Has no issues or complaints  today. He denies signs of infection. 11/9; patient traveled this weekend to [place], was on the cast quite a bit. Staining of the cast with black material from his walking boot. Drainage was not quite as bad as we feared. Using silver alginate and Keystone 11/16; we do not have size for cast therefore we have been putting a soft cast on him since the change on Friday. Still a significant amount of drainage necessitating changing twice a week. We have been using the Keystone at cast changes either hard or soft as well as silver alginate Comes in the clinic with things actually looking fairly good improvement in width. He says his offloading is about the same 02/24/2021 upon evaluation today patient actually comes back in and is doing excellent in regard to his foot ulcer this is significantly smaller even compared to the last visit. The  soft cast seems to have done extremely well for him which is great news. I do not see any signs of infection minimal debridement will be needed today. 11/30; left lateral foot much improved half a centimeter improvement in surface area. No evidence of infection. He seems to be doing better with the soft cast in the TCC therefore we will continue with this. He comes back in later in the week for a change with the nurses. This is due to drainage 12/6; no improvement in dimensions. Under illumination some debris on the surface we have been using silver alginate, soft cast. If there is anything optimistic here he seems to have have less drainage 12/13. Dimensions are improved both length and width and slightly in depth. Appears to be quite healthy today. Raised edges of this thick skin and callus around the edges however. He is in a soft cast were bringing him back once for a change on Friday. Drainage is better 12/20. Dimensions are improved. He still has raised edges of thick skin and callus around the edges. We are using a soft cast 12/28; comes in today with thick callus  around the wound. Using silver under alginate under a soft cast. I do not think there is much improvement in any measurement 2023 04/06/2021; patient was put in a total contact cast. Unfortunately not much change in surface area 1/10; not much different still thick callus and skin around the edge in spite of the total contact cast. This was just debrided last week we have been using the [facility] compounded antibiotic and silver alginate under a total contact cast 1/18 the patient's wound on the left side is doing nicely. smaller HOWEVER he comes in today with a wound on the right foot laterally. blister most likely serosangquenous drainage 1/24; the patient continues to do well in terms of the plantar left foot which is continued to contract using silver alginate under the total contact cast HOWEVER the right lateral foot is bigger with denuded skin around the edges. I used pickups and a #15 scalpel to remove this this looks like the remanence of a large blister. Cannot rule out infection. Culture in this area I did last week showed Staphylococcus lugdunensis few colonies. I am going to try to address this with his [employer] antibiotic that is done so well on the left having linezolid and this should cover the staph 2/1; the patient's wound on his left foot which was the original plantar foot wound thick skin and eschar around the edges even in the total contact cast but the wound surface does not look too bad The real problem is on how his right lateral foot at roughly the base of the fifth metatarsal. The wound is completely necrotic more worrisome than that there is swelling around the edges of this. We have been using silver alginate on both wounds and Keystone on the right foot. Unfortunately I think he is going to require systemic antibiotics while we await cultures. He did not get the x-ray done that we ordered last week [lost the prescription 2/7; disappointingly in the area on the left foot  which we are treating with a total contact cast is still not closed although it is much smaller. He continues to have a lot of callus around the wound edge. -Right lateral foot culture I did last week was negative x-ray also negative for osteomyelitis. 2/15: TCC silver alginate on the left and silver alginate on the right lateral. No real improvement in either  area 05/26/2021: Today, the wounds are roughly the same size as at his previous visit, post-debridement. He continues to endorse fairly substantial drainage, Febo, [name] ([id number]) [phone].pdf Page 16 of 21 particularly on the right. He has been in a total contact cast on the left. There is still some callus surrounding this lesion. On the right, the periwound skin is quite macerated, along with surrounding callus. The center of the right-sided wound also has some dark, densely adherent material, which is very difficult to remove. 06/02/2021: Today, both wounds are slightly smaller. He has been using zinc oxide ointment around the right ulcer and the degree of maceration has improved markedly. There continues to be an area of nonviable tissue in the center of the right sided ulcer. The left-sided wound, which has been in the total contact cast. Appears clean and the degree of callus around it is less than previously. 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 He had to take the stairs and both wounds have increased in size. The left foot, which has been in a total contact cast, has developed a tunnel tracking to the lateral aspect of his foot. The nonviable tissue in the center of the right-sided ulcer remains recalcitrant to debridement. There is significant undermining surrounding the entirety of the left sided wound. 06/16/2021: The elevator at school has been fixed and the patient has been able to avoid putting as much weight on his wounds over the  past week. We converted the left foot wound into a single lesion today, but despite this, the wound is actually smaller. The base is healthy with limited periwound callus. On the right, the central necrotic area is still present. He continues to be quite macerated around the right sided wound, despite applying barrier cream. This does, however, have the benefit of softening the callus to make it more easily removable. 06/23/2021: Today, the left wound is smaller. The lateral aspect that had opened up previously is now closed. The wound base has a healthy bed of granulation tissue and minimal slough. Unfortunately, on the right, the wound is larger and continues to be fairly macerated. He has also reopened the wound at his right ankle. He thinks this is due to the gait he has adopted secondary to his total contact cast and boot. 06/30/2021: T oday, both wounds are a little bit larger. The lateral aspect on the left has remained closed. He continues to have significant periwound maceration. The culture that I took from the right sided wound grew a population of bacteria that is not covered by his current [facility] antibiotic. The center of the right- sided wound continues to appear necrotic with nonviable fat. It probes deeper today, but does not reach bone. 07/07/2021: The periwound maceration is a little bit less today. The right lateral foot wound has some areas that appear more viable and the necrotic center also looks a little bit better. The wound on the dorsal surface of his right foot near the ankle is contracting and the surface appears healthy. The left plantar wound surface looks healthy, but there is some new undermining on the medial portion. He did get his new Keystone antibiotic and began applying that to the right foot wound on Saturday. 07/14/2021: The intake nurse reported substantial drainage from his wounds, but the periwound skin actually looks better than is typical for him. The wound  on the dorsal surface of his right foot near the ankle is smaller  and just has a small open area underneath some dried eschar. The left plantar wound surface looks healthy and there has been no significant accumulation of callus. The right lateral foot wound looks quite a bit better, with the central portion, which typically appears necrotic, looking more viable albeit pale. 07/22/2021: His left foot is extremely macerated today. The wound is about the same size. The wound on the dorsal surface of his right foot near the ankle had closed, but he traumatized it removing the dressing and there is a tiny skin tear in that location. The right lateral foot wound is bigger, but the surface appears healthy. 07/30/2021: The wound on the dorsal surface of his right foot near the ankle is closed. The right lateral foot wound again is a little bit bigger due to some undermining. The periwound skin is in better condition, however. He has been applying zinc oxide. The wound surface is a little bit dry today. On the left, he does not have the substantial maceration that we frequently see. The wound itself is smaller and has a clean surface. 08/06/2021: Both wounds seem to have deteriorated over the past week. The right lateral foot wound has a dry surface but the periwound is boggy.. Overall wound dimensions are about the same. On the left, the wound is about the same size, but there is more undermining present underneath periwound callus. 08/13/2021: The right sided wound looks about the same, but on the left there has been substantial deterioration. The undermining continues to extend under periwound callus. Once this was removed, substantial extension of the wound was present. There is no odor or purulent drainage but clearly the wounds have broken down. 08/20/2021: The wounds look about the same today. He has been out of his total contact cast and has just been changing the dressings using topical Keystone with  PolyMem Ag, Kerlix and Ace bandages. The wound on the top of his right ankle has reopened but this is quite small. There was a little bit of purulent material that I expressed when examining this wound. 08/24/2021: After the aggressive debridement I performed at his last visit, the wounds actually look a little bit better today. They are smaller with the exception of the wound on the top of his right ankle which is a little bit bigger as some more skin pulled off when he was changing his dressing. We are using topical Keystone with PolyMem Ag Kerlix and Ace bandages. 09/02/2021: There has been really no change to any of his wounds. 09/16/2021: The patient was hospitalized last week with nausea, vomiting, and dehydration. He says that while he was in the hospital, his wounds were not really addressed properly. T oday, both plantar foot wounds are larger and the periwound skin is macerated. The wound on the dorsum of his right foot has a scab on the top. The right foot now has a crater where previously he had had nonviable fat. It looks as though this simply died and fell out. The periwound callus is wet. 09/24/2021: His wounds have deteriorated somewhat since his last visit. The wound on the dorsum of his right foot near his ankle is larger and has more nonviable tissue present. The crater in his right foot is even deeper; I cannot quite palpate or probe to bone but I am sure it is close. The wound on his left plantar foot has an odd boggy area in the center that almost feels as though it has fluid within it. He has  run out of his topical Keystone antibiotic. We are using silver alginate on his wounds. 09/29/2021: He has developed a new wound on the dorsum of his left foot near his ankle. He says he thinks his wrapping is rubbing in that site. I would concur with this as the wound on his right ankle is larger. The left foot looks about the same. The right foot has the crater that was present last week. No  significant slough accumulation, but his foot remains quite swollen and warm despite oral antibiotic therapy. 10/08/2021: All of his wounds look about the same as last week. He did not start his oral antibiotics that are prescribed until just a couple of days ago; his [employer] compounded antibiotics formula has been changed and he is awaiting delivery of the new recipe. His MRI that was scheduled for earlier this week was canceled as no prior authorization had been obtained; unfortunately the tech responsible sent an email to my old [employer] email, which I no longer use nor have access to. 10/18/2021: The wounds on his bilateral dorsal feet near the ankles are both improved. They are smaller and have just some eschar and slough buildup. The left plantar wound has a fair amount of undermining, but the surface is clean. There is some periwound callus accumulation. On the right plantar foot, there is nonviable fat leading to a deep tunnel that tracks towards his dorsal medial foot. There is periwound callus and slough accumulation, as well. His right foot and leg remain swollen as compared to the left. 10/25/2021: The wounds on his bilateral dorsal feet and at the ankles have broken down somewhat. They are little bit larger than last week. The left plantar wound continues to undermine laterally but the surface is clean. The right plantar foot wound shows some decreased depth in the tunnel tracking towards his dorsal medial foot. He has not yet had the Doppler study that I ordered; it sounds like there is some confusion about the scheduling of the procedure. In addition, the MRI was denied and I have taken steps to appeal the denial. 11/24/2021: Since our last visit, Mr. Krugman was admitted to the hospital where an MRI suggested osteomyelitis. He was taken the operating room by podiatry. Bone biopsies were negative for osteomyelitis. They debrided his wounds and applied myriad matrix. He saw them last  week and they removed his staples. He is here today to continue his wound healing process. T oday, both of the dorsal foot/ankle wounds are substantially smaller. There is just a little eschar overlying the left sided wound and some eschar and slough on the right. The right plantar foot ulcer has the healthiest surface of granulation tissue that [place]lane, [name] ([id number]) [phone].pdf Page 17 of 21 I have seen to date. A portion of the myriad matrix failed to take and was hanging loose. It appears that myriad morcells were placed into the tunnel closest to the dorsal portion of his foot. These have sloughed off. The left plantar foot ulcer is about the same size, but has a much healthier surface than in the past. Both plantar ulcers have callus and slough accumulation. 12/07/2021: Left dorsal foot/ankle wound is closed. The right dorsal foot/ankle wound is nearly closed and just has a small open area with some eschar and slough. The right plantar foot wound has contracted quite a bit since our last visit. It has a healthy surface with just a little bit of slough accumulation and periwound callus buildup. The left plantar  foot wound is about the same size but the surface appears healthy. There is a little slough and periwound callus on this side, as well. 12/15/2021: Both dorsal foot/ankle wounds are closed. The right plantar foot wound is substantially smaller than at our last visit. The tunneling that was present has nearly closed. There is just a little bit of slough buildup. The left plantar foot wound is also a little bit smaller today. The surface is the healthiest that I have ever seen it. Light slough and periwound callus accumulation on this side. 12/22/2021: The dorsal ankle/foot wounds remain closed. The right plantar foot wound continues to contract. There is still a bit of depth at the lateral portion of the wound but the surface has a good granulated appearance.  The wound on the left is about the same size, the central indented portion is still adherent. It also is clean with good granulation tissue present. The periwound skin and callus are a bit macerated, however. 12/29/2021: Both ankle wounds remain closed. The right plantar foot wound is less than half the size that it was last week. There is no depth any longer and the surface has just a little bit of slough and periwound callus. On the left, the wound is not much smaller, but the surface is healthier with good granulation tissue. There is also a little slough and periwound callus buildup. 01/05/2022: The right plantar foot wound continues to contract and is once again about half the size as it was last week. There is just a little bit of surface slough and periwound callus. On the left, the depth of the wound has decreased and the diameter has started to contract. It is also very clean with just a little slough and biofilm present. 01/12/2022: The right plantar foot wound is smaller again today. There is just some periwound callus and slough accumulation. On the left, there is a fair amount of undermining and the surface is a little boggy, but no other significant change. 01/19/2022: The right plantar wound continues to contract. There is minimal periwound callus accumulation and just a light layer of slough on the surface. On the left, the surface looks better, but there is fairly significant undermining near the 11:00 portion of the wound, aiming towards his great toe. The skin overlying this area is very healthy and has a good fat layer present. No malodor or purulent drainage. 01/26/2022: The right sided wound continues to contract and has just a light layer of slough on the surface. Minimal periwound callus. On the left, he still has substantial undermining and the tissue surface is not as robust as I would like to see. No malodor or purulent drainage, but he did say he had a lot of serous drainage  over the weekend. 02/02/2022: The right foot wound is about a quarter of the size that it was at his last visit. There is just a little slough on the surface and some periwound callus. On the left, the undermining persists and the tissue is still more pale, but he does not seem to have had as much drainage over the last weekend. We are waiting on a wound VAC. 02/09/2022: His right foot has healed. He received the wound VAC, but did not bring it to clinic with him today. The lateral aspect of the left foot wound has come in a little, but he still has substantial undermining on the medial and distal portion of the wound. Still with macerated periwound callus. 02/16/2022: The wound VAC  was applied last Friday. T oday, there has been tremendous improvement in the surface of the wound bed. The undermined portion of the wound has come in a bit. There is still some overhanging dead skin. Overall the wound looks much better. 02/23/2022: No significant change in the overall wound dimensions, but the wound surface continues to improve and appears healthier and more robust. There is some periwound callus accumulation and overhanging skin. No concern for infection. 03/02/2022: Although the measurements taken in clinic today were unchanged, the visual appearance of the wound looks like it is smaller and the undermining/tunneling is filling with flesh. There is less periwound tissue maceration than usual. No malodor or purulent drainage. No concern for infection. 03/09/2022: The undermining has come in by couple of millimeters. The periwound is a little bit more macerated this week. No concern for infection. 12/13; substantial wound on the left plantar foot. We are using silver collagen and a wound VAC. 03/23/2022: The undermining continues to contract. The wound surface is a little bit dry. 03/30/2022: The undermining has essentially resolved. There is still some depth at the center of the wound. The wound surface  has good beefy tissue and the moisture balance is better. 04/06/2022: The wound dimensions are about the same, except that the depth is beginning to fill in. He has had more drainage this week, but there is no obvious concern for infection. 04/13/2022: He has built up a little bit of callus around the edges of the wound, creating some undermining. Otherwise the wound looks fairly unchanged. 04/20/2022: The wound bed tissue looks very healthy and robust. Light biofilm on the surface. There is some built up wet callus around the wound edges, but no undermining. 04/27/2022: The wound surface continues to look very healthy. Although the wound dimensions measured the same, it looks smaller to me. No real callus accumulation this week. Minimal slough on the surface. 05/04/2022: The wound measured smaller and shallower this week. We are still awaiting insurance approval for Apligraf. 05/11/2022: The wound is about the same in terms of depth but measured a little bit narrower today. He has built up a rim of moist callus around the edges. He has been approved for Apligraf and we will apply that today. 05/18/2022: The wound measured a little bit smaller again today. No significant callus buildup this week. 05/25/2022: The wound was measured about the same size today, but the multiple nooks and crannies in the depths of the wound seem to be filling in. 06/01/2022: The wound dimensions are smaller today, with the exception of the depth. There is a little bit of moist callus around the edges. 06/08/2022: The wound continues to contract aside from the depth. Once again, there is moist callus around the edges. The odor is less profound this week. 3/13; patient presents for follow-up. He has had 5 applications of Apligraf and is currently not approved for more by insurance. He has also had a wound VAC in place. He has no issues or complaints today. 06/22/2022: The depth of the wound has come in somewhat. There is some  undermining created by skin encroaching over the top of the wound. The wound continues to have a pungent odor. Caprio, [name] ([id number]) [id number] Page 18 of 21 Patient History Information obtained from Patient. Family History No family history of Cancer, Diabetes, Hereditary Spherocytosis, Hypertension, Kidney Disease, Lung Disease, Seizures, Stroke, Thyroid Problems, Tuberculosis. Social History Never smoker, Marital Status - Single, Alcohol Use - Rarely, Drug Use - No History, Caffeine  Use - Never. Medical History Eyes Denies history of Cataracts, Glaucoma, Optic Neuritis Ear/Nose/Mouth/Throat Denies history of Chronic sinus problems/congestion, Middle ear problems Hematologic/Lymphatic Denies history of Anemia, Hemophilia, Human Immunodeficiency Virus, Lymphedema, Sickle Cell Disease Respiratory Denies history of Aspiration, Asthma, Chronic Obstructive Pulmonary Disease (COPD), Pneumothorax, Sleep Apnea, Tuberculosis Cardiovascular Denies history of Angina, Arrhythmia, Congestive Heart Failure, Coronary Artery Disease, Deep Vein Thrombosis, Hypertension, Hypotension, Myocardial Infarction, Peripheral Arterial Disease, Peripheral Venous Disease, Phlebitis, Vasculitis Gastrointestinal Denies history of Cirrhosis , Colitis, Crohnoos, Hepatitis A, Hepatitis B, Hepatitis C Endocrine Patient has history of Type II Diabetes Denies history of Type I Diabetes Immunological Denies history of Lupus Erythematosus, Raynaudoos, Scleroderma Integumentary (Skin) Denies history of History of Burn Musculoskeletal Denies history of Gout, Rheumatoid Arthritis, Osteoarthritis, Osteomyelitis Neurologic Denies history of Dementia, Neuropathy, Quadriplegia, Paraplegia, Seizure Disorder Oncologic Denies history of Received Chemotherapy, Received Radiation Psychiatric Denies history of Anorexia/bulimia, Confinement Anxiety Hospitalization/Surgery History -  11/1-11/06/2018- sepsis foot infection. - 11/4-11/5 02 sats low respiratory distress. Objective Constitutional Slightly hypertensive. Slightly tachycardic. no acute distress. Vitals Time Taken: 8:19 AM, Height: 77 in, Weight: 280 lbs, BMI: 33.2, Temperature: 98.4 F, Pulse: 101 bpm, Respiratory Rate: 20 breaths/min, Blood Pressure: 144/99 mmHg, Capillary Blood Glucose: 78 mg/dl. General Notes: glucose per pt report this am Respiratory Normal work of breathing on room air. General Notes: 06/22/2022: The depth of the wound has come in somewhat. There is some undermining created by skin encroaching over the top of the wound. The wound continues to have a pungent odor. Integumentary (Hair, Skin) Wound #3 status is Open. Original cause of wound was Trauma. The date acquired was: 10/02/2019. The wound has been in treatment 138 weeks. The wound is located on the [place]. The wound measures 2cm length x 2.3cm width x 0.5cm depth; 3.613cm^2 area and 1.806cm^3 volume. There is Fat Layer (Subcutaneous Tissue) exposed. There is no tunneling noted, however, there is undermining starting at 12:00 and ending at 12:00 with a maximum distance of 0.7cm. There is a medium amount of serosanguineous drainage noted. Foul odor after cleansing was noted. The wound margin is distinct with the outline attached to the wound base. There is large (67-100%) red granulation within the wound bed. There is a small (1-33%) amount of necrotic tissue within the wound bed including Adherent Slough. The periwound skin appearance had no abnormalities noted for color. The periwound skin appearance exhibited: Callus, Maceration. The periwound skin appearance did not exhibit: Dry/Scaly. Periwound temperature was noted as No Abnormality. 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 There was a Excisional Skin/Subcutaneous Tissue Debridement with a total area of 4.6 sq cm performed by [name], MD. With the following instrument(s): Curette to remove Non-Viable tissue/material. Material removed includes Callus, Subcutaneous Tissue, and Skin: Epidermis. 1 specimen was taken by a Tissue Culture and sent to the lab per facility protocol. A time out was conducted at 08:50, prior to the start of the procedure. A Minimum amount of bleeding was controlled with Pressure. The procedure was tolerated well with a pain level of 0 throughout and a pain level of 0 following the procedure. Post Debridement Measurements: 2cm length x 2.3cm width x 1.6cm depth;  5.781cm^3 volume. Character of Wound/Ulcer Post Debridement is improved. Severity of Tissue Post Debridement is: Fat layer exposed. Post procedure Diagnosis Wound #3: Same as Pre-Procedure General Notes: Scribed for Dr. [name] by Baruch Gouty, RN. Plan Follow-up Appointments: Return Appointment in 1 week. - Dr. [name] - Room 1 with [facility] Wednesday 3/27 @ 08:15 am Bathing/ Shower/ Hygiene: May shower and wash wound with soap and water. Negative Presssure Wound Therapy: Wound #3 Left,Lateral,Plantar Foot: Wound Vac to wound continuously at 158mm/hg pressure Black Foam Edema Control - Lymphedema / SCD / Other: Avoid standing for long periods of time. Exercise regularly Compression stocking or Garment 20-30 mm/Hg pressure to: - to both legs daily Off-Loading: Open toe surgical shoe to: - Both feet Additional Orders / Instructions: Follow Nutritious Diet Non Wound Condition: Apply the following to affected area as directed: - continue to apply foam donut  or cushion to healed right plantar foot Laboratory ordered were: Anaerobic culture - left foot WOUND #3: - Foot Wound Laterality: Plantar, Left, Lateral Cleanser: Soap and Water 3 x Per Week/30 Days Discharge Instructions: May shower and wash wound with dial antibacterial soap and water prior to dressing change. Cleanser: Vashe 5.8 (oz) 3 x Per Week/30 Days Discharge Instructions: Cleanse the wound with Vashe prior to applying a clean dressing, let sit on wound for 5-10 minutesusing gauze sponges, not tissue or cotton balls. Peri-Wound Care: Skin Prep (DME) (Generic) 3 x Per Week/30 Days Discharge Instructions: Use skin prep as directed Topical: Gentamicin 3 x Per Week/30 Days Discharge Instructions: thin layer to wound bed in clinic Prim Dressing: Promogran Prisma Matrix, 4.34 (sq in) (silver collagen) (Dispense As Written) 3 x Per Week/30 Days ary Discharge Instructions: Moisten collagen with saline or hydrogel Prim Dressing: VAC 3 x Per Week/30 Days ary Secondary Dressing: Zetuvit Absorbent Pad, 4x4 (in/in) (DME) (Dispense As Written) 3 x Per Week/30 Days Secured With: Elastic Bandage 4 inch (ACE bandage) (Generic) 3 x Per Week/30 Days Discharge Instructions: Secure with ACE bandage as directed. Secured With: 37M Medipore Public affairs consultant Surgical T 2x10 (in/yd) (Generic) 3 x Per Week/30 Days ape Discharge Instructions: Secure with tape as directed. Com pression Wrap: Kerlix Roll 4.5x3.1 (in/yd) (DME) (Generic) 3 x Per Week/30 Days Discharge Instructions: Apply Kerlix and Coban compression as directed. 06/22/2022: The depth of the wound has come in somewhat. There is some undermining created by skin encroaching over the top of the wound. The wound continues to have a pungent odor. I used a curette to debride the skin that created the undermining away from the wound. Although there are no other signs of infection, the odor has me concerned that something has been brewing and so I did take a  culture of the subcutaneous tissue. Based on prior culture data, I am going to apply topical gentamicin to the wound surface and then Prisma silver collagen and his wound VAC. Once his culture data return, I will make further decisions. He will follow- up in 1 week. Electronic Signature(s) Signed: 06/22/2022 9:17:52 AM By: [name] MD FACS Previous Signature: 06/22/2022 9:16:50 AM Version By: [name] MD FACS Entered By: [name] on 06/22/2022 09:17:51 HxROS Details -------------------------------------------------------------------------------- Kussman, [name] ([id number]) [id number] Page 20 of 21 Patient Name: Date of Service: [name] 06/22/2022 8:15 A M Medical Record Number: [id number] Patient Account Number: [id number] Date of Birth/Sex: Treating RN: [birth year] (37 y.o. M) Primary Care Provider: [name] Other Clinician: Referring Provider: Treating Provider/Extender: [name] in  Treatment: 138 Information Obtained From Patient Eyes Medical History: Negative for: Cataracts; Glaucoma; Optic Neuritis Ear/Nose/Mouth/Throat Medical History: Negative for: Chronic sinus problems/congestion; Middle ear problems Hematologic/Lymphatic Medical History: Negative for: Anemia; Hemophilia; Human Immunodeficiency Virus; Lymphedema; Sickle Cell Disease Respiratory Medical History: Negative for: Aspiration; Asthma; Chronic Obstructive Pulmonary Disease (COPD); Pneumothorax; Sleep Apnea; Tuberculosis Cardiovascular Medical History: Negative for: Angina; Arrhythmia; Congestive Heart Failure; Coronary Artery Disease; Deep Vein Thrombosis; Hypertension; Hypotension; Myocardial Infarction; Peripheral Arterial Disease; Peripheral Venous Disease; Phlebitis; Vasculitis Gastrointestinal Medical History: Negative for: Cirrhosis ; Colitis; Crohns; Hepatitis A; Hepatitis B; Hepatitis C Endocrine Medical  History: Positive for: Type II Diabetes Negative for: Type I Diabetes Time with diabetes: 8 Treated with: Insulin Blood sugar tested every day: No Immunological Medical History: Negative for: Lupus Erythematosus; Raynauds; Scleroderma Integumentary (Skin) Medical History: Negative for: History of Burn Musculoskeletal Medical History: Negative for: Gout; Rheumatoid Arthritis; Osteoarthritis; Osteomyelitis Neurologic Medical History: Negative for: Dementia; Neuropathy; Quadriplegia; Paraplegia; Seizure Disorder Oncologic Medical History: Negative for: Received Chemotherapy; Received Radiation Psychiatric Medical History: Youngren, [name] ([id number]) [id number] Page 21 of 21 Negative for: Anorexia/bulimia; Confinement Anxiety Immunizations Pneumococcal Vaccine: Received Pneumococcal Vaccination: No Implantable Devices None Hospitalization / Surgery History Type of Hospitalization/Surgery 11/1-11/06/2018- sepsis foot infection 11/4-11/5 02 sats low respiratory distress Family and Social History Cancer: No; Diabetes: No; Hereditary Spherocytosis: No; Hypertension: No; Kidney Disease: No; Lung Disease: No; Seizures: No; Stroke: No; Thyroid Problems: No; Tuberculosis: No; Never smoker; Marital Status - Single; Alcohol Use: Rarely; Drug Use: No History; Caffeine Use: Never; Financial Concerns: No; Food, Clothing or Shelter Needs: No; Support System Lacking: No; Transportation Concerns: No Electronic Signature(s) Signed: 06/22/2022 9:48:35 AM By: [name] MD FACS Entered By: [name] on 06/22/2022 09:06:47 -------------------------------------------------------------------------------- SuperBill Details Patient Name: Date of Service: [name], [name] 06/22/2022 Medical Record Number: [id number] Patient Account Number: [id number] Date of Birth/Sex: Treating RN: December 20, 1985 (37 y.o. 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 Modifier: ity Quantity: 1 : CPT4 Code: [id number] Description: [id number] - WOUND VAC-50 SQ CM OR LESS Modifier: 72 Quantity: 1 Physician Procedures : CPT4 Code Description Modifier [id number] - WC PHYS LEVEL 4 - EST PT 25 ICD-10 Diagnosis Description L97.528 Non-pressure chronic ulcer of other part of left foot with other specified severity E11.621 Type 2 diabetes mellitus with foot ulcer Quantity: 1 : [id number] - WC PHYS SUBQ TISS 20 SQ CM ICD-10 Diagnosis Description L97.528 Non-pressure chronic ulcer of other part of left foot with other specified severity Quantity: 1 Electronic Signature(s) Signed: 06/22/2022 3:48:41 PM By: [name] MD FACS Signed: 06/22/2022 5:57:17 PM By: Baruch Gouty RN, BSN Previous Signature: 06/22/2022 9:18:18 AM Version By: [name] MD FACS Entered By: Baruch Gouty on 06/22/2022 11:46:18

## 2022-06-23 NOTE — Progress Notes (Signed)
Massar, [name] ([id number]) [id number] Page 1 of 8 Visit Report for 06/22/2022 Arrival Information Details Patient Name: Date of Service: [name] 06/22/2022 8:15 A M Medical Record Number: [id number] Patient Account Number: [id number] Date of Birth/Sex: Treating RN: 1986-01-15 (37 y.o. M) Primary Care Makeyla Govan: [name] Other Clinician: Referring Andrian Urbach: Treating Tykeisha Peer/Extender: [name] in Treatment: 72 Visit Information History Since Last Visit All ordered tests and consults were completed: No Patient Arrived: Ambulatory Added or deleted any medications: No Arrival Time: 08:19 Any new allergies or adverse reactions: No Accompanied By: self Had a fall or experienced change in No Transfer Assistance: None activities of daily living that may affect Patient Identification Verified: Yes risk of falls: Secondary Verification Process Completed: Yes Signs or symptoms of abuse/neglect since last visito No Patient Requires Transmission-Based Precautions: No Hospitalized since last visit: No Patient Has Alerts: No Implantable device outside of the clinic excluding No cellular tissue based products placed in the center since last visit: Has Dressing in Place as Prescribed: Yes Has Compression in Place as Prescribed: Yes Pain Present Now: No Electronic Signature(s) Signed: 06/22/2022 5:57:17 PM By: Baruch Gouty RN, BSN Entered By: Baruch Gouty on 06/22/2022 08:32:53 -------------------------------------------------------------------------------- Encounter Discharge Information Details Patient Name: Date of Service: [name], [name] 06/22/2022 8:15 A M Medical Record Number: [id number] Patient Account Number: [id number] Date of Birth/Sex: Treating RN: [birth year] (37 y.o. [name] Primary Care Keeli Roberg: [name] Other Clinician: Referring Damarcus Reggio: Treating Rogerio Boutelle/Extender: [name] in Treatment: [phone] Encounter Discharge Information Items Post Procedure Vitals Discharge Condition: Stable Temperature (F): 98.4 Ambulatory Status: Ambulatory Pulse (bpm): 101 Discharge Destination: Home Respiratory Rate (breaths/min): 18 Transportation: Private Auto Blood Pressure (mmHg): 144/99 Accompanied By: self Schedule Follow-up Appointment: Yes Clinical Summary of Care: Patient Declined Electronic Signature(s) Signed: 06/22/2022 5:57:17 PM By: Baruch Gouty RN, BSN Entered By: Baruch Gouty on 06/22/2022 09:18:45 -------------------------------------------------------------------------------- Lower Extremity Assessment Details Patient Name: Date of Service: [name], [name] 06/22/2022 8:15 A M Medical Record Number: [id number] Patient Account Number: [id number] Date of Birth/Sex: Treating RN: 04/05/1985 (37 y.o. [name] Primary Care Sadie Hazelett: [name] Other Clinician: Referring Fawn Desrocher: Treating Deandrae Wajda/Extender: [name] in Treatment: 138 Edema Assessment Left: [Left: Right] [Right: :] Assessed: [Left: No] [Right: No] Edema: [Left: Ye] [Right: s] Calf Left: Right: Point of Measurement: 48 cm From Medial Instep 49.5 cm Ankle Left: Right: Point of Measurement: 11 cm From Medial Instep 30 cm Vascular Assessment Pulses: Dorsalis Pedis Palpable: [Left:Yes] Electronic Signature(s) Signed: 06/22/2022 5:57:17 PM By: Baruch Gouty RN, BSN Entered By: Baruch Gouty on 06/22/2022 08:33:58 -------------------------------------------------------------------------------- Multi Wound Chart Details Patient Name: Date of Service: [name], [name] 06/22/2022 8:15 A M Medical Record Number: [id number] Patient Account Number: [id number] Date of Birth/Sex: Treating RN: 1986-02-28 (37 y.o. M) Primary Care Rilie Glanz: [name] Other Clinician: Referring Caoilainn Sacks: Treating Safari Cinque/Extender:  [name] in Treatment: 138 Vital Signs Height(in): 77 Capillary Blood Glucose(mg/dl): 78 Weight(lbs): 280 Pulse(bpm): 101 Body Mass Index(BMI): 33.2 Blood Pressure(mmHg): 144/99 Temperature(F): 98.4 Respiratory Rate(breaths/min): 20 [3:Photos:] [N/A:N/A] Left, Lateral, Plantar Foot N/A N/A Wound Location: Trauma N/A N/A Wounding Event: Diabetic Wound/Ulcer of the Lower N/A N/A Primary Etiology: Extremity Type II Diabetes N/A N/A Comorbid History: 10/02/2019 N/A N/A Date Acquired: 138 N/A N/A Weeks of Treatment: Open N/A N/A Wound Status: No N/A N/A Wound Recurrence: 2x2.3x0.5 N/A N/A Measurements L x W x [name] 3.613  N/A N/A A (cm) : rea 1.806 N/A N/A Volume (cm) : -119.10% N/A N/A % Reduction in A rea: -994.50% N/A N/A % Reduction in Volume: 12 Starting Position 1 (o'clock): 12 Ending Position 1 (o'clock): 0.7 Maximum Distance 1 (cm): Yes N/A N/A Undermining: Grade 2 N/A N/A Classification: Medium N/A N/A Exudate A mount: Serosanguineous N/A N/A Exudate Type: red, brown N/A N/A Exudate Color: Yes N/A N/A Foul Odor A Cleansing: fter [place], [name] ([id number] Page 3 of 8 No N/A N/A Odor Anticipated Due to Product Use: Distinct, outline attached N/A N/A Wound Margin: Large (67-100%) N/A N/A Granulation Amount: Red N/A N/A Granulation Quality: Small (1-33%) N/A N/A Necrotic Amount: Fat Layer (Subcutaneous Tissue): Yes N/A N/A Exposed Structures: Fascia: No Tendon: No Muscle: No Joint: No Bone: No Small (1-33%) N/A N/A Epithelialization: Debridement - Excisional N/A N/A Debridement: Pre-procedure Verification/Time Out 08:50 N/A N/A Taken: Callus, Subcutaneous N/A N/A Tissue Debrided: Skin/Subcutaneous Tissue N/A N/A Level: 4.6 N/A N/A Debridement A (sq cm): rea Curette N/A N/A Instrument: Minimum N/A N/A Bleeding: Pressure N/A N/A Hemostasis A  chieved: 0 N/A N/A Procedural Pain: 0 N/A N/A Post Procedural Pain: Procedure was tolerated well N/A N/A Debridement Treatment Response: 2x2.3x1.6 N/A N/A Post Debridement Measurements L x W x [name] 5.781 N/A N/A Post Debridement Volume: (cm) Callus: Yes N/A N/A Periwound Skin Texture: Maceration: Yes N/A N/A Periwound Skin Moisture: Dry/Scaly: No No Abnormalities Noted N/A N/A Periwound Skin Color: No Abnormality N/A N/A Temperature: Debridement N/A N/A Procedures Performed: Negative Pressure Wound Therapy Maintenance (NPWT) Treatment Notes Electronic Signature(s) Signed: 06/22/2022 9:05:08 AM By: [name] MD FACS Entered By: [name] on 06/22/2022 09:05:08 -------------------------------------------------------------------------------- Multi-Disciplinary Care Plan Details Patient Name: Date of Service: [name], [name] 06/22/2022 8:15 A M Medical Record Number: [id number] Patient Account Number: [id number] Date of Birth/Sex: Treating RN: 1985/09/10 (37 y.o. [name] Primary Care Princeston Blizzard: [name] Other Clinician: Referring Charlize Hathaway: Treating Kaylena Pacifico/Extender: [name] in Treatment: [place] reviewed with physician Active Inactive Nutrition Nursing Diagnoses: Imbalanced nutrition Potential for alteratiion in Nutrition/Potential for imbalanced nutrition Goals: Patient/caregiver agrees to and verbalizes understanding of need to use nutritional supplements and/or vitamins as prescribed Date Initiated: 10/24/2019 Date Inactivated: 04/06/2020 Target Resolution Date: 04/03/2020 Goal Status: Met Patient/caregiver will maintain therapeutic glucose control Date Initiated: 10/24/2019 Target Resolution Date: 06/29/2022 Goal Status: Active Interventions: Assess HgA1c results as ordered upon admission and as needed Assess patient nutrition upon admission and as needed per policy Provide  education on elevated blood sugars and impact on wound healing Mancil, [name] ([id number] Page 4 of 8 Provide education on nutrition Treatment Activities: Education provided on Nutrition : 12/07/2021 Notes: 11/17/20: Glucose control ongoing issue, target date extended. 01/26/21: Glucose management continues. Wound/Skin Impairment Nursing Diagnoses: Impaired tissue integrity Knowledge deficit related to ulceration/compromised skin integrity Goals: Patient/caregiver will verbalize understanding of skin care regimen Date Initiated: 10/24/2019 Target Resolution Date: 06/29/2022 Goal Status: Active Ulcer/skin breakdown will have a volume reduction of 30% by week 4 Date Initiated: 10/24/2019 Date Inactivated: 01/16/2020 Target Resolution Date: 01/10/2020 Unmet Reason: no change in Goal Status: Unmet measurements. Interventions: Assess patient/caregiver ability to obtain necessary supplies Assess patient/caregiver ability to perform ulcer/skin care regimen upon admission and as needed Assess ulceration(s) every visit Provide education on ulcer and skin care Notes: 11/17/20: Wound care regimen continues Electronic Signature(s) Signed: 06/22/2022 5:57:17 PM By: Baruch Gouty RN, BSN Entered By: Baruch Gouty on 06/22/2022 08:38:20 -------------------------------------------------------------------------------- Negative Pressure  Wound Therapy Maintenance (NPWT) Details Patient Name: Date of Service: [name] 06/22/2022 8:15 A M Medical Record Number: [id number] Patient Account Number: [id number] Date of Birth/Sex: Treating RN: [birth year] (37 y.o. [name] Primary Care Cyerra Yim: [name] Other Clinician: Referring Stefannie Defeo: Treating Eligio Angert/Extender: [name] in Treatment: 138 NPWT Maintenance Performed for: Wound #3 Left, Lateral, Plantar Foot Performed By: Baruch Gouty, RN Type: VAC  System Coverage Size (sq cm): 4.6 Pressure Type: Constant Pressure Setting: 125 mmHG Drain Type: None Primary Contact: Other : prisma Sponge/Dressing Type: Foam- Black Date Initiated: 02/11/2022 Quantity of Sponges/Gauze Removed: 1 Canister Changed: Yes Canister Exudate Volume: 100 Dressing Reapplied: Yes Quantity of Sponges/Gauze Inserted: 1 Respones T Treatment: o good Days On NPWT : 132 Post Procedure Diagnosis Same as Pre-procedure Electronic Signature(s) Signed: 06/22/2022 5:57:17 PM By: Baruch Gouty RN, BSN Entered By: Baruch Gouty on 06/22/2022 08:59:21 [place], [name] ([id number] Page 5 of 8 -------------------------------------------------------------------------------- Pain Assessment Details Patient Name: Date of Service: [name] 06/22/2022 8:15 A M Medical Record Number: [id number] Patient Account Number: [id number] Date of Birth/Sex: Treating RN: 1986/01/18 (37 y.o. M) Primary Care Noha Karasik: [name] Other Clinician: Referring Kilan Banfill: Treating Ranbir Chew/Extender: [name] in Treatment: 138 Active Problems Location of Pain Severity and Description of Pain Patient Has Paino No Site Locations Rate the pain. Current Pain Level: 0 Pain Management and Medication Current Pain Management: Electronic Signature(s) Signed: 06/22/2022 5:57:17 PM By: Baruch Gouty RN, BSN Entered By: Baruch Gouty on 06/22/2022 08:33:21 -------------------------------------------------------------------------------- Patient/Caregiver Education Details Patient Name: Date of Service: [name], [name] 3/20/2024andnbsp8:15 A M Medical Record Number: [id number] Patient Account Number: [id number] Date of Birth/Gender: Treating RN: 12-Mar-1986 (37 y.o. [name] Primary Care Physician: [name] Other Clinician: Referring Physician: Treating Physician/Extender: [name] in Treatment: 138 Education Assessment Education Provided To: Patient Education Topics Provided Elevated Blood Sugar/ Impact on Healing: Methods: Explain/Verbal Responses: Reinforcements needed, State content correctly Offloading: Methods: Explain/Verbal Responses: Reinforcements needed, State content correctly Wound/Skin Impairment: Methods: Explain/Verbal Responses: Reinforcements needed, State content correctly [place], [name] ([id number]) [id number] Page 6 of 8 Electronic Signature(s) Signed: 06/22/2022 5:57:17 PM By: Baruch Gouty RN, BSN Entered By: Baruch Gouty on 06/22/2022 08:39:12 -------------------------------------------------------------------------------- Wound Assessment Details Patient Name: Date of Service: [name], [name] 06/22/2022 8:15 A M Medical Record Number: [id number] Patient Account Number: [id number] Date of Birth/Sex: Treating RN: 03-Mar-1986 (36 y.o. M) Primary Care Liese Dizdarevic: [name] Other Clinician: Referring Monnie Anspach: Treating Myreon Wimer/Extender: [name] in Treatment: 138 Wound Status Wound Number: 3 Primary Etiology: Diabetic Wound/Ulcer of the Lower Extremity Wound Location: Left, Lateral, Plantar Foot Wound Status: Open Wounding Event: Trauma Comorbid History: Type II Diabetes Date Acquired: 10/02/2019 Weeks Of Treatment: 138 Clustered Wound: No Photos Wound Measurements Length: (cm) 2 Width: (cm) 2.3 Depth: (cm) 0.5 Area: (cm) 3.613 Volume: (cm) 1.806 % Reduction in Area: -119.1% % Reduction in Volume: -994.5% Epithelialization: Small (1-33%) Tunneling: No Undermining: Yes Starting Position (o'clock): 12 Ending Position (o'clock): 12 Maximum Distance: (cm) 0.7 Wound Description Classification: Grade 2 Wound Margin: Distinct, outline attached Exudate Amount: Medium Exudate Type: Serosanguineous Exudate Color: red, brown Foul  Odor After Cleansing: Yes Due to Product Use: No Slough/Fibrino Yes Wound Bed Granulation Amount: Large (67-100%) Exposed Structure Granulation Quality: Red Fascia Exposed: No Necrotic Amount: Small (1-33%) Fat Layer (Subcutaneous Tissue) Exposed: Yes Necrotic Quality: Adherent Slough Tendon Exposed: No Muscle Exposed: No Joint  Exposed: No Bone Exposed: No Periwound Skin Texture Texture Color No Abnormalities Noted: No No Abnormalities Noted: Yes Callus: Yes Temperature / Pain Temperature: No Abnormality Moisture No Abnormalities Noted: No Dry / Scaly: No MacerationDaonte [name], [name] ([id number] Page 7 of 8 Treatment Notes Wound #3 (Foot) Wound Laterality: Plantar, Left, Lateral Cleanser Soap and Water Discharge Instruction: May shower and wash wound with dial antibacterial soap and water prior to dressing change. Vashe 5.8 (oz) Discharge Instruction: Cleanse the wound with Vashe prior to applying a clean dressing, let sit on wound for 5-10 minutesusing gauze sponges, not tissue or cotton balls. Peri-Wound Care Skin Prep Discharge Instruction: Use skin prep as directed Topical Gentamicin Discharge Instruction: thin layer to wound bed in clinic Primary Dressing Promogran Prisma Matrix, 4.34 (sq in) (silver collagen) Discharge Instruction: Moisten collagen with saline or hydrogel VAC Secondary Dressing Zetuvit Absorbent Pad, 4x4 (in/in) Secured With Elastic Bandage 4 inch (ACE bandage) Discharge Instruction: Secure with ACE bandage as directed. 9M Medipore Soft Cloth Surgical T 2x10 (in/yd) ape Discharge Instruction: Secure with tape as directed. Compression Wrap Kerlix Roll 4.5x3.1 (in/yd) Discharge Instruction: Apply Kerlix and Coban compression as directed. Compression Stockings Add-Ons Electronic Signature(s) Signed: 06/22/2022 5:57:17 PM By: Baruch Gouty RN, BSN Entered By: Baruch Gouty on 06/22/2022  08:37:29 -------------------------------------------------------------------------------- Vitals Details Patient Name: Date of Service: [name], [name] 06/22/2022 8:15 A M Medical Record Number: [id number] Patient Account Number: [id number] Date of Birth/Sex: Treating RN: [birth year] (37 y.o. M) Primary Care Siddhartha Hoback: [name] Other Clinician: Referring Nyree Yonker: Treating Donal Lynam/Extender: [name] in Treatment: 138 Vital Signs Time Taken: 08:19 Temperature (F): 98.4 Height (in): 77 Pulse (bpm): 101 Weight (lbs): 280 Respiratory Rate (breaths/min): 20 Body Mass Index (BMI): 33.2 Blood Pressure (mmHg): 144/99 Capillary Blood Glucose (mg/dl): 78 Reference Range: 80 - 120 mg / dl Notes glucose per pt report this am Electronic Signature(s) Signed: 06/22/2022 5:57:17 PM By: Baruch Gouty RN, BSN Entered By: Baruch Gouty on 06/22/2022 08:33:12 [place], [name] ([id number] Page 8 of 8

## 2022-06-29 ENCOUNTER — Encounter (HOSPITAL_BASED_OUTPATIENT_CLINIC_OR_DEPARTMENT_OTHER): Payer: BC Managed Care – PPO | Admitting: General Surgery

## 2022-06-29 DIAGNOSIS — E11621 Type 2 diabetes mellitus with foot ulcer: Secondary | ICD-10-CM | POA: Diagnosis not present

## 2022-06-30 NOTE — Progress Notes (Signed)
Closson, [name] ([id number]) [id number] Page 1 of 21 Visit Report for 06/29/2022 Chief Complaint Document Details Patient Name: Date of Service: [name] 06/29/2022 8:15 A M Medical Record Number: [id number] Patient Account Number: [id number] Date of Birth/Sex: Treating RN: 1986-02-21 (37 y.o. [name] Primary Care Provider: [name] Other Clinician: Referring Provider: Treating Provider/Extender: [name] in Treatment: 139 Information Obtained from: Patient Chief Complaint 01/11/2019; patient is here for review of a rather substantial wound over the left fifth plantar metatarsal head extending into the lateral part of his foot 10/24/2019; patient returns to clinic with wounds on his bilateral feet with underlying osteomyelitis biopsy-proven Electronic Signature(s) Signed: 06/29/2022 9:04:43 AM By: [name] MD FACS Entered By: [name] on 06/29/2022 09:04:43 -------------------------------------------------------------------------------- Debridement Details Patient Name: Date of Service: [name], [name] 06/29/2022 8:15 A M Medical Record Number: [id number] Patient Account Number: [id number] Date of Birth/Sex: Treating RN: Jun 28, 1985 (37 y.o. [name] Primary Care Provider: [name] Other Clinician: Referring Provider: Treating Provider/Extender: [name] in Treatment: 139 Debridement Performed for Assessment: Wound #3 Left,Lateral,Plantar Foot Performed By: Physician [name], MD Debridement Type: Debridement Severity of Tissue Pre Debridement: Fat layer exposed Level of Consciousness (Pre-procedure): Awake and Alert Pre-procedure Verification/Time Out Yes - 08:40 Taken: Start Time: 08:45 T Area Debrided (L x W): otal 2 (cm) x 3 (cm) = 6 (cm) Tissue and other material debrided: Non-Viable, Callus, Skin: Epidermis, Biofilm Level:  Skin/Epidermis Debridement Description: Selective/Open Wound Instrument: Curette Bleeding: Minimum Hemostasis Achieved: Pressure Procedural Pain: 0 Post Procedural Pain: 0 Response to Treatment: Procedure was tolerated well Level of Consciousness (Post- Awake and Alert procedure): Post Debridement Measurements of Total Wound Length: (cm) 2.3 Width: (cm) 2.8 Depth: (cm) 2 Volume: (cm) 10.116 Character of Wound/Ulcer Post Debridement: Improved Severity of Tissue Post Debridement: Fat layer exposed Post Procedure Diagnosis Same as Pre-procedure Notes Scribed for Dr. [name] by Baruch Gouty, RN Electronic Signature(s) Signed: 06/29/2022 9:16:27 AM By: [name] MD FACS Signed: 06/29/2022 5:17:40 PM By: Baruch Gouty RN, BSN Quinby, [name] [phone] By: Baruch Gouty RN, BSN [phone].pdf Page 2 of 21 Signed: 06/29/2022 5:17:40 Entered By: Baruch Gouty on 06/29/2022 08:49:14 -------------------------------------------------------------------------------- HPI Details Patient Name: Date of Service: [name] 06/29/2022 8:15 A M Medical Record Number: [id number] Patient Account Number: [id number] Date of Birth/Sex: Treating RN: [birth year] (37 y.o. 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 He comes in for review of a wound over the plantar fifth metatarsal head extending into the lateral part of the foot. He was followed for this previously by his podiatrist Dr. Cornelius Moras. As the patient tells his story he went to see podiatry first for a swelling he developed on the lateral part of his fifth metatarsal head in May. He states this was "open" by podiatry and  the area closed. He was followed up in June and it was again opened callus removed and it closed promptly. There were plans being made for surgery on the fifth metatarsal head in June however his blood sugar was apparently too high for anesthesia. Apparently the area was debrided and opened again in June and it is never closed since. Looking over the records from podiatry I am really not able  to follow this. It was clear when he was first seen it was before 5/14 at that point he already had a wound. By 5/17 the ulcer was resolved. I do not see anything about a procedure. On 5/28 noted to have pre-ulcerative moderate keratosis. X-ray noted 1/5 contracted toe and tailor's bunion and metatarsal deformity. On a visit date on 09/28/2018 the dorsal part of the left foot it healed and resolved. There was concern about swelling in his lower extremity he was sent to the ER.. As far as I can tell he was seen in the ER on 7/12 with an ulcer on his left foot. A DVT rule out of the left leg was negative. I do not think I have complete records from podiatry but I am not able to verify the procedures this patient states he had. He states after the last procedure the wound has never closed although I am not able to follow this in the records I have from podiatry. He has not had a recent x-ray The patient has been using Neosporin on the wound. He is wearing a Darco shoe. He is still very active up on his foot working and exercising. Past medical history; type 2 diabetes ketosis-prone, leg swelling with a negative DVT study in July. Non-smoker ABI in our clinic was 0.85 on the left 10/16; substantial wound on the plantar left fifth met head extending laterally almost to the dorsal fifth MTP. We have been using silver alginate we gave him a Darco forefoot off loader. An x-ray did not show evidence of osteomyelitis did note soft tissue emphysema which I think was due to gas tracking through an open wound. There is no  doubt in my mind he requires an MRI 10/23; MRI not booked until 3 November at the earliest this is largely due to his glucose sensor in the right arm. We have been using silver alginate. There has been an improvement 10/29; I am still not exactly sure when his MRI is booked for. He says it is the third but it is the 10th in epic. This definitely needs to be done. He is running a low-grade fever today but no other symptoms. No real improvement in the 1 02/26/2019 patient presents today for a follow-up visit here in our clinic he is last been seen in the clinic on October 29. Subsequently we were working on getting MRI to evaluate and see what exactly was going on and where we would need to go from the standpoint of whether or not he had osteomyelitis and again what treatments were going be required. Subsequently the patient ended up being admitted to the hospital on 02/07/2019 and was discharged on 02/14/2019. This is a somewhat interesting admission with a discharge diagnosis of pneumonia due to COVID-19 although he was positive for COVID-19 when tested at the urgent care but negative x2 when he was actually in the hospital. With that being said he did have acute respiratory failure with hypoxia and it was noted he also have a left foot ulceration with osteomyelitis. With that being said he did require oxygen for his pneumonia and I level 4 L. He was placed on antivirals and steroids for the COVID-19. He was also transferred to the [facility] at one point. Nonetheless he did subsequently discharged home and since being home has done much better in that regard. The CT angiogram did not show any pulmonary embolism. With regard to the osteomyelitis the patient was placed on vancomycin and Zosyn  while in the hospital but has been changed to Augmentin at discharge. It was also recommended that he follow- up with wound care and podiatry. Podiatry however wanted him to see [name] according to the patient  prior to them doing anything further. His hemoglobin A1c was 9.9 as noted in the hospital. Have an MRI of the left foot performed while in the hospital on 02/04/2019. This showed evidence of septic arthritis at the fifth MTP joint and osteomyelitis involving the fifth metatarsal head and proximal phalanx. There is an overlying plantar open wound noted an abscess tracking back along the lateral aspect of the fifth metatarsal shaft. There is otherwise diffuse cellulitis and mild fasciitis without findings of polymyositis. The patient did have recently pneumonia secondary to COVID-19 I looked in the chart through epic and it does appear that the patient may need to have an additional x-ray just to ensure everything is cleared and that he has no airspace disease prior to putting him into the [name]. 03/05/2019; patient was readmitted to the clinic last week. He was hospitalized twice for a viral upper respiratory tract infection from 11/1 through 11/4 and then 11/5 through 11/12 ultimately this turned out to be Covid pneumonitis. Although he was discharged on oxygen he is not using it. He says he feels fine. He has no exercise limitation no cough no sputum. His O2 sat in our clinic today was 100% on room air. He did manage to have his MRI which showed septic arthritis at the fifth MTP joint and osteomyelitis involving the fifth metatarsal head and proximal phalanx. He received Vanco and Zosyn in the hospital and then was discharged on 2 weeks of Augmentin. I do not see any relevant cultures. He was supposed to follow-up with infectious disease but I do not see that he has an appointment. 12/8; patient saw Dr. Novella Olive of infectious disease last week. He felt that he had had adequate antibiotic therapy. He did not go to follow-up with Dr. Amalia Hailey of podiatry and I have again talked to him about the pros and cons of this. He does not want to consider a ray amputation of this time. He is aware of the risks of  recurrence, migration etc. He started HBO today and tolerated this well. He can complete the Augmentin that I gave him last week. I have looked over the lab work that Dr. Chana Bode ordered his C-reactive protein was 3.3 and his sedimentation rate was 17. The C-reactive protein is never really been measurably that high in this patient 12/15; not much change in the wound today however he has undermining along the lateral part of the foot again more extensively than last week. He has some rims of epithelialization. We have been using silver alginate. He is undergoing hyperbarics but did not dive today 12/18; in for his obligatory first total contact cast change. Unfortunately there was pus coming from the undermining area around his fifth metatarsal head. This was cultured but will preclude reapplication of a cast. He is seen in conjunction with HBO 12/24; patient had staph lugdunensis in the wound in the undermining area laterally last time. We put him on doxycycline which should have covered this. The wound looks better today. I am going to give him another week of doxycycline before reattempting the total contact cast 12/31; the patient is completing antibiotics. Hemorrhagic debris in the distal part of the wound with some undermining distally. He also had hyper granulation. Extensive debridement with a #5 curette. The infected area  that was on the lateral part of the fifth met head is closed over. I do not think he needs any more antibiotics. Patient was seen prior to HBO. Preparations for a total contact cast were made in the cast will be placed post hyperbarics 04/11/19; once again the patient arrives today without complaint. He had been in a cast all week noted that he had heavy drainage this week. This resulted in large raised areas of macerated tissue around the wound 1/14; wound bed looks better slightly smaller. Hydrofera Blue has been changing himself. He had a heavy drainage last week which  caused a lot of maceration Deiter, [name] ([id number]) [phone].pdf Page 3 of 21 around the wound so I took him out of a total contact cast he says the drainage is actually better this week He is seen today in conjunction with HBO 1/21; returns to clinic. He was up in [place] for a day or 2 attending a funeral. He comes back in with the wound larger and with a large area of exposed bone. He had osteomyelitis and septic arthritis of the fifth left metatarsal head while he was in hospital. He received IV antibiotics in the hospital for a prolonged period of time then 3 weeks of Augmentin. Subsequently I gave him 2 weeks of doxycycline for more superficial wound infection. When I saw this last week the wound was smaller the surface of the wound looks satisfactory. 1/28; patient missed hyperbarics today. Bone biopsy I did last time showed Enterococcus faecalis and Staphylococcus lugdunensis . He has a wide area of exposed bone. We are going to use silver alginate as of today. I had another ethical discussion with the patient. This would be recurrent osteomyelitis he is already received IV antibiotics. In this situation I think the likelihood of healing this is low. Therefore I have recommended a ray amputation and with the patient's agreement I have referred him to Dr. Doran Durand. The other issue is that his compliance with hyperbarics has been minimal because of his work schedule and given his underlying decision I am going to stop this today READMISSION 10/24/2019 MRI 09/29/2019 left foot IMPRESSION: 1. Apparent skin ulceration inferior and lateral to the 5th metatarsal base with underlying heterogeneous T2 signal and enhancement in the subcutaneous fat. Small peripherally enhancing fluid collections along the plantar and lateral aspects of the 5th metatarsal base suspicious for abscesses. 2. Interval amputation through the mid 5th metatarsal with nonspecific low-level  marrow edema and enhancement. Given the proximity to the adjacent soft tissue inflammatory changes, osteomyelitis cannot be excluded. 3. The additional bones appear unremarkable. MRI 09/29/2019 right foot IMPRESSION: 1. Soft tissue ulceration lateral to the 5th MTP joint. There is low-level T2 hyperintensity within the 4th and 5th metatarsal heads and adjacent proximal phalanges without abnormal T1 signal or cortical destruction. These findings are nonspecific and could be seen with early marrow edema, hyperemia or early osteomyelitis. No evidence of septic joint. 2. Mild tenosynovitis and synovial enhancement associated with the extensor digitorum tendons at the level of the midfoot. 3. Diffuse low-level muscular T2 hyperintensity and enhancement, most consistent with diabetic myopathy. LEFT FOOT BONE Methicillin resistant staphylococcus aureus Staphylococcus lugdunensis MIC MIC CIPROFLOXACIN >=8 RESISTANT Resistant <=0.5 SENSI... Sensitive CLINDAMYCIN <=0.25 SENS... Sensitive >=8 RESISTANT Resistant ERYTHROMYCIN >=8 RESISTANT Resistant >=8 RESISTANT Resistant GENTAMICIN <=0.5 SENSI... Sensitive <=0.5 SENSI... Sensitive Inducible Clindamycin NEGATIVE Sensitive NEGATIVE Sensitive OXACILLIN >=4 RESISTANT Resistant 2 SENSITIVE Sensitive RIFAMPIN <=0.5 SENSI... Sensitive <=0.5 SENSI... Sensitive TETRACYCLINE <=1 SENSITIVE Sensitive <=1 SENSITIVE  Sensitive TRIMETH/SULFA <=10 SENSIT Sensitive <=10 SENSIT Sensitive ... Marland Kitchen.. VANCOMYCIN 1 SENSITIVE Sensitive <=0.5 SENSI... Sensitive Right foot bone . Component 3 wk ago Specimen Description BONE Special Requests RIGHT 4 METATARSAL SAMPLE B Gram Stain NO WBC SEEN NO ORGANISMS SEEN Culture RARE METHICILLIN RESISTANT STAPHYLOCOCCUS AUREUS NO ANAEROBES ISOLATED Performed at [facility], [place] [address] [place], [place] [address] Report Status 10/08/2019 FINAL Organism ID, Bacteria METHICILLIN RESISTANT STAPHYLOCOCCUS  AUREUS Resulting Agency [facility] Susceptibility Methicillin resistant staphylococcus aureus MIC CIPROFLOXACIN >=8 RESISTANT Resistant CLINDAMYCIN <=0.25 SENS... Sensitive ERYTHROMYCIN >=8 RESISTANT Resistant GENTAMICIN <=0.5 SENSI... Sensitive Inducible Clindamycin NEGATIVE Sensitive OXACILLIN >=4 RESISTANT Resistant RIFAMPIN <=0.5 SENSI... Sensitive TETRACYCLINE <=1 SENSITIVE Sensitive TRIMETH/SULFA <=10 SENSIT Sensitive ... VANCOMYCIN 1 SENSITIVE Sensitive This is a patient we had in clinic earlier this year with a wound over his left fifth metatarsal head. 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 Doran Durand on 05/16/2019. He seems to have developed problems with wounds on his bilateral feet in June 2021 although he may have had problems earlier than this. He was in an urgent care with a right foot ulcer on 09/26/2019 and given a course of doxycycline. This was apparently after having trouble getting into see orthopedics. He was seen by podiatry on 09/28/2019 noted to have bilateral lower extremity ulcers including the left lateral fifth metatarsal base and the right subfifth met head. It was noted that had purulent drainage at that time. He required hospitalization from 6/20 through 7/2. This was because of worsening right foot wounds. He underwent bilateral operative incision and drainage and bone biopsies bilaterally. Culture results are listed above. He has been referred back to clinic by Dr. Jacqualyn Posey of podiatry. He is also followed by Dr. Megan Salon who saw him yesterday. He was discharged from hospital on Zyvox Flagyl and  Levaquin and yesterday changed to doxycycline Flagyl and Levaquin. His inflammatory markers on 6/26 showed a sedimentation rate of 129 and a C-reactive protein of 5. This is improved to 14 and 1.3 respectively. This would indicate improvement. ABIs in our clinic today were 1.23 on the right and 1.20 on the left 11/01/2019 on evaluation today patient appears to be doing fairly well in regard to the wounds on his feet at this point. Fortunately there is no signs of active infection at this time. No fevers, chills, nausea, vomiting, or diarrhea. He currently is seeing infectious disease and still under their care at this point. Subsequently he also has both wounds which she has not been using collagen on as he did not receive that in his packaging he did not call us and let us know that. Apparently that just was missed on the order. Nonetheless we will get that straightened out today. 8/9-Patient returns for bilateral foot wounds, using Prisma with hydrogel moistened dressings, and the wounds appear stable. Patient using surgical shoes, avoiding much pressure or weightbearing as much as possible 8/16; patient has bilateral foot wounds. 1 on the right lateral foot proximally the other is on the left mid lateral foot. Both required debridement of callus and thick skin around the wounds. We have been using silver collagen 8/27; patient has bilateral lateral foot wounds. The area on  the left substantially surrounded by callus and dry skin. This was removed from the wound edge. The underlying wound is small. The area on the right measured somewhat smaller today. 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 Unfortunately I did not update his antibiotics during today's visit. 9/10 I reviewed Dr. Hale Bogus last notes he felt he had completed antibiotics his inflammatory markers were reasonably well controlled. He has a small wound on the lateral  left foot and a tiny area on the right which is just above closed. He is using Hydrofera Blue with border foam he has bilateral surgical shoes 9/24; 2 week f/u. doing well. right foot is closed. left foot still undermined. 10/14; right foot remains closed at the fifth met head. The area over the base of the left fifth metatarsal has a small open area but considerable undermining towards the plantar foot. Thick callus skin around this suggests an adequate pressure relief. We have talked about this. He says he is going to go back into his cam boot. I suggested a total contact cast he did not seem enamored with this suggestion 10/26; left foot base of the fifth metatarsal. Same condition as last time. He has skin over the area with an open wound however the skin is not adherent. He went to see Dr. Earleen Newport who did an x-ray and culture of his foot I have not reviewed the x-ray but the patient was not told anything. He is on doxycycline 11/11; since the patient was last here he was in the emergency room on 10/30 he was concerned about swelling in the left foot. They did not do any cultures or x-rays. They changed his antibiotics to cephalexin. Previous culture showed group B strep. The cephalexin is appropriate as doxycycline has less than predictable coverage. Arrives in clinic today with swelling over this area under the wound. He also has a new wound on the right fifth metatarsal head 11/18; the patient has a difficult wound on the lateral aspect of the left fifth metatarsal head. The wound was almost ballotable last week I opened it slightly expecting to see purulence however there was just bleeding. I cultured this this was negative. X-ray unchanged. We are trying to get an MRI but I am not sure were going to be able to get this through his insurance. He also has an area on the right lateral fifth metatarsal head this looks healthier 12/3; the patient finally got our MRI. Surprisingly this did not show  osteomyelitis. I did show the soft tissue ulceration at the lateral plantar aspect of the fifth metatarsal base with a tiny residual 6 mm abscess overlying the superficial fascia I have tried to culture this area I have not been able to get this to grow anything. Nevertheless the protruding tissue looks aggravated. I suspect we should try to treat the underlying "abscess with broad-spectrum antibiotics. I am going to start him on Levaquin and Flagyl. He has much less edema in his legs and I am going to continue to wrap his legs and see him weekly 12/10. I started Levaquin and Flagyl on him last week. He just picked up the Flagyl apparently there was some delay. The worry is the wound on the left fifth metatarsal base which is substantial and worsening. His foot looks like he inverts at the ankle making this a weightbearing surface. Certainly no improvement in fact I think the measurements of this are somewhat worse. We  have been using 12/17; he apparently just got the Levaquin yesterday this is 2 weeks after the fact. He has completed the Flagyl. The area over the left fifth metatarsal base still has protruding granulation tissue although it does not look quite as bad as it did some weeks ago. He has severe bilateral lymphedema although we have not been treating him for wounds on his legs this is definitely going to require compression. There was so much edema in the left I did not wish to put him in a total contact cast today. I am going to increase his compression from 3-4 layer. The area on the right lateral fifth met head actually look quite good and superficial. 12/23; patient arrived with callus on the right fifth met head and the substantial hyper granulated callused wound on the base of his fifth metatarsal. He says he is completing his Levaquin in 2 days but I do not think that adds up with what I gave him but I will have to double check this. We are using Hydrofera Blue on both areas. My  plan is to put the left leg in a cast the week after New Year's 04/06/2020; patient's wounds about the same. Right lateral fifth metatarsal head and left lateral foot over the base of the fifth metatarsal. There is undermining on the left lateral foot which I removed before application of total contact cast continuing with Hydrofera Blue new. Patient tells me he was seen by endocrinology today lab work was done [Dr. Kerr]. Also wondering whether he was referred to cardiology. I went over some lab work from previously does not have chronic renal failure certainly not nephrotic range proteinuria he does have very poorly controlled diabetes but this is not his most updated lab work. Hemoglobin A1c has been over 11 1/10; the patient had a considerable amount of leakage towards mid part of his left foot with macerated skin however the wound surface looks better the area on the right lateral fifth met head is better as well. I am going to change the dressing on the left foot under the total contact cast to silver alginate, continue with Hydrofera Blue on the right. 1/20; patient was in the total contact cast for 10 days. Considerable amount of drainage although the skin around the wound does not look too bad on the left foot. The area on the right fifth metatarsal head is closed. Our nursing staff reports large amount of drainage out of the left lateral foot wound 1/25; continues with copious amounts of drainage described by our intake staff. PCR culture I did last week showed E. coli and Enterococcus faecalis and low quantities. Multiple resistance genes documented including extended spectrum beta lactamase, MRSA, MRSE, quinolone, tetracycline. The wound is not quite as good this week as it was 5 days ago but about the same size 2/3; continues with copious amounts of malodorous drainage per our intake nurse. The PCR culture I did 2 weeks ago showed E. coli and low quantities of Enterococcus. There were  multiple resistance genes detected. I put Neosporin on him last week although this does not seem to have helped. The wound is slightly deeper today. Offloading continues to be an issue here although with the amount of drainage she has a total contact cast is just not going to work 2/10; moderate amount of drainage. Patient reports he cannot get his stocking on over the dressing. I told him we have to do that the nurse gave him suggestions on how  to make this work. The wound is on the bottom and lateral part of his left foot. Is cultured predominantly grew low amounts of Enterococcus, E. coli and anaerobes. There were multiple resistance genes detected including extended spectrum beta lactamase, quinolone, tetracycline. I could not think of an easy oral combination to address this so for now I am going to do topical antibiotics provided by [facility] I think the main agents here are vancomycin and an aminoglycoside. We have to be able to give him access to the wounds to get the topical antibiotic on 2/17; moderate amount of drainage this is unchanged. He has his Keystone topical antibiotic against the deep tissue culture organisms. He has been using this and changing the dressing daily. Silver alginate on the wound surface. 2/24; using Keystone antibiotic with silver alginate on the top. He had too much drainage for a total contact cast at one point although I think that is improving and I think in the next week or 2 it might be possible to replace a total contact cast I did not do this today. In general the wound surface looks healthy however he continues to have thick rims of skin and subcutaneous tissue around the wide area of the circumference which I debrided 06/04/2020 upon evaluation today patient appears to be doing well in regard to his wound. I do feel like he is showing signs of improvement. There is little bit of callus and dead tissue around the edges of the wound as well as what appears to be a  little bit of a sinus tract that is off to the side laterally I would perform debridement to clear that away today. 3/17; left lateral foot. The wound looks about the same as I remember. Not much depth surface looks healthy. No evidence of infection 3/25; left lateral foot. Wound surface looks about the same. Separating epithelium from the circumference. There really is no evidence of infection here however not making progress by my view [name], [name] ([id number]) [id number] Page 5 of 21 3/29; left lateral foot. Surface of the wound again looks reasonably healthy still thick skin and subcutaneous tissue around the wound margins. There is no evidence of infection. One of the concerns being brought up by the nurses has again the amount of drainage vis--vis continued use of a total contact cast 4/5; left lateral foot at roughly the base of the fifth metatarsal. Nice healthy looking granulated tissue with rims of epithelialization. The overall wound measurements are not any better but the tissue looks healthy. The only concern is the amount of drainage although he has no surrounding maceration with what we have been doing recently to absorb fluid and protect his skin. He also has lymphedema. 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 He states that the kerlix/coban under the TCC rolled down and could not get it back up. He has been using an offloading scooter and has somehow hurt his right foot using this device. This happened last week. He states that the side of his right foot developed a blister and opened. The top of his foot also has a few small open wounds he thinks is due to his socks rubbing in his shoes. He has not been  using any  dressings to the wound. He denies purulent drainage, fever/chills or erythema to the wounds. 4/22; patient presents for 1 week follow-up. He developed new wounds to the right foot that were evaluated at last clinic visit. He continues to have a total contact cast to the left leg and he reports no issues. He has been using silver collagen to the right foot wounds with no issues. He denies purulent drainage, fever/chills or erythema to the right foot wounds. He has no complaints today 4/25; patient presents for 1 week follow-up. He has a total contact cast of the left leg and reports no issues. He has been using silver alginate to the right foot wound. He denies purulent drainage, fever/chills or erythema to the right foot wounds. 5/2 patient presents for 1 week follow-up. T contact cast on the left. The wound which is on the base of the plantar foot at the base of the fifth metatarsal otal actually looks quite good and dimensions continue to gradually contract. HOWEVER the area on the right lateral fifth metatarsal head is much larger than what I remember from 2 weeks ago. Once more is he has significant levels of hypergranulation. Noteworthy that he had this same hyper granulated response on his wound on the left foot at one point in time. So much so that he I thought there was an underlying fluid collection. Based on this I think this just needs debridement. 5/9; the wound on the left actually continues to be gradually smaller with a healthy surface. Slight amount of drainage and maceration of the skin around but not too bad. However he has a large wound over the right fifth metatarsal head very much in the same configuration as his left foot wound was initially. I used silver nitrate to address the hyper granulated tissue no mechanical debridement 5/16; area on the left foot did not look as healthy this week deeper thick surrounding macerated skin and subcutaneous tissue. The area on the  right foot fifth met head was about the same The area on the right ankle that we identified last week is completely broken down into an open wound presumably a stocking rubbing issue 5/23; patient has been using a total contact cast to the left side. He has been using silver alginate underneath. He has also been using silver alginate to the right foot wounds. He has no complaints today. He denies any signs of infection. 5/31; the left-sided wound looks some better measure smaller surface granulation looks better. We have been using silver alginate under the total contact cast The large area on his right fifth met head and right dorsal foot look about the same still using silver alginate 6/6; neither side is good as I was hoping although the surface area dimensions are better. A lot of maceration on his left and right foot around the wound edge. Area on the dorsal right foot looks better. He says he was traveling. I am not sure what does the amount of maceration around the plantar wounds may be drainage issues 6/13; in general the wound surfaces look quite good on both sides. Macerated skin and raised edges around the wound required debridement although in general especially on the left the surface area seems improved. The area on the right dorsal ankle is about the same I thought this would not be such a problem to close 6/20; not much change in either wound although the one on the right looks a little better. Both wounds have thick macerated edges to the  skin requiring debridements. We have been using silver alginate. The area on the dorsal right ankle is still open I thought this would be closed. 6/28; patient comes in today with a marked deterioration in the right foot wound fifth met head. Wide area of exposed bone this is a drastic change from last time. The area on the left there we have been casting is stagnant. We have been using silver alginate in both wound areas. 7/5; bone culture I did  for PCR last time was positive for Pseudomonas, group B strep, Enterococcus and Staph aureus. There was no suggestion of methicillin resistance or ampicillin resistant genes. This was resistant to tetracycline however He comes into the clinic today with the area over his right plantar fifth metatarsal head which had been doing so well 2 weeks ago completely necrotic feeling bone. I do not know that this is going to be salvageable. The left foot wound is certainly no smaller but it has a better surface and is superficial. 7/8; patient called in this morning to say that his total contact cast was rubbing against his foot. He states he is doing fine overall. He denies signs of infection. 7/12; continued deterioration in the wound over the right fifth metatarsal head crumbling bone. This is not going to be salvageable. The patient agrees and wants to be referred to Dr. Doran Durand which we will attempt to arrange as soon as possible. I am going to continue him on antibiotics as long as that takes so I will renew those today. The area on the left foot which is the base of the fifth metatarsal continues to look somewhat better. Healthy looking tissue no depth no debridement is necessary here. 7/20; the patient was kindly seen by Dr. Doran Durand of orthopedics on 10/19/2020. 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 Towards this end we have taken him out of the total contact cast on the left we will put him in a wrap with Hydrofera Blue. As I understand things surgery is planned for 7/21 7/27; patient had his surgery last Thursday. 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 Apparently everything went well we did not still disturb that today The area on the left foot actually looks quite good. He has been much less mobile which probably explains this he did not seem to do well in the total contact cast secondary to drainage and  maceration I think. 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 Fortunately there is no evidence of active infection at this time. No fevers, chills, nausea, vomiting, or diarrhea. Overall I think that he is actually doing extremely well. Nonetheless I do believe that he is staying off of this more following the surgery in his right foot that is the reason the left is doing so great. 8/16; left plantar foot wound. This looks smaller than the last time I saw this he is using Hydrofera Blue. The surgical wound on the right foot is being followed by Dr. Doran Durand we did not look at this today. He has surgical shoes on both feet 8/23; left plantar foot wound not as good this week. Surrounding macerated skin and subcutaneous tissue everything looks moist and wet. I do not think he is offloading this adequately. He is using a surgical shoe  Apparently the right foot surgical wound is not open although I did not check his foot 8/31; left plantar foot lateral aspect. Much improved this week. He has no maceration. Some improvement in the surface area of the wound but most impressively the depth is come in we are using silver alginate. The patient is a Product/process development scientist. He is asked that we write him a letter so he can go back to work. I have also tried to see if we can write something that will allow him to limit the amount of time that he is on his foot at work. 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 Hopefully the latter can be avoided 9/6; I believe the patient missed an appointment last week. He arrives in today with a wound looking roughly the same certainly no better. Undermining laterally [name], [name] ([id number]) [id number] Page 6 of 21 and also inferiorly. We used molecuLight  today in training with the patient's permission.. We are using silver alginate 9/21 wound is measuring bigger this week although this may have to do with the aggressive circumferential debridement last week in response to the blush fluorescence on the MolecuLight. Culture I did last week showed significant MSSA and E. coli. I put him on Augmentin but he has not started it yet. We are also going to send this for compounded antibiotics at [facility], [place]. There is no evidence of systemic infection 9/29; silver alginate. His Keystone arrived. He is completing Augmentin in 2 days. Offloading in a cam boot. Moderate drainage per our intake staff 10/5; using silver alginate. He has been using his [place]. He has completed his Augmentin. Per our intake nurse still a lot of drainage, far too much to consider a total contact cast. Wound measures about the same. He had the same undermining area that I defined last week from a roughly 11-3. I remove this today 10/12; using silver alginate he is using the [place]. He comes in for a nurse visit hence we are applying [employer] twice a week. Measuring slightly better today and less notable drainage. Extensive debridement of the wound edge last time 10/18; using topical Keystone and silver alginate and a soft cast. Wound measurements about the same. Drainage was through his soft cast. We are changing this twice a week Tuesdays and Friday 10/25; comes in with moderate drainage. Still using Keystone silver alginate and a soft cast. Wound dimensions completely the same.He has a lot of edema in the left leg he has lymphedema. Asking for [name] to consider wrapping him as he cannot get his stocking on over the soft cast 11/2; comes in with moderate to large drainage slightly smaller in terms of width we have been using [place]. His wound looks satisfactory but not much improvement 11/4; patient presents today for obligatory cast change. Has no issues or complaints today. He  denies signs of infection. 11/9; patient traveled this weekend to [place], was on the cast quite a bit. Staining of the cast with black material from his walking boot. Drainage was not quite as bad as we feared. Using silver alginate and Keystone 11/16; we do not have size for cast therefore we have been putting a soft cast on him since the change on Friday. Still a significant amount of drainage necessitating changing twice a week. We have been using the Keystone at cast changes either hard or soft as well as silver alginate Comes in  the clinic with things actually looking fairly good improvement in width. He says his offloading is about the same 02/24/2021 upon evaluation today patient actually comes back in and is doing excellent in regard to his foot ulcer this is significantly smaller even compared to the last visit. The soft cast seems to have done extremely well for him which is great news. I do not see any signs of infection minimal debridement will be needed today. 11/30; left lateral foot much improved half a centimeter improvement in surface area. No evidence of infection. He seems to be doing better with the soft cast in the TCC therefore we will continue with this. He comes back in later in the week for a change with the nurses. This is due to drainage 12/6; no improvement in dimensions. Under illumination some debris on the surface we have been using silver alginate, soft cast. If there is anything optimistic here he seems to have have less drainage 12/13. Dimensions are improved both length and width and slightly in depth. Appears to be quite healthy today. Raised edges of this thick skin and callus around the edges however. He is in a soft cast were bringing him back once for a change on Friday. Drainage is better 12/20. Dimensions are improved. He still has raised edges of thick skin and callus around the edges. We are using a soft cast 12/28; comes in today with thick callus around the  wound. Using silver under alginate under a soft cast. I do not think there is much improvement in any measurement 2023 04/06/2021; patient was put in a total contact cast. Unfortunately not much change in surface area 1/10; not much different still thick callus and skin around the edge in spite of the total contact cast. This was just debrided last week we have been using the [facility] compounded antibiotic and silver alginate under a total contact cast 1/18 the patient's wound on the left side is doing nicely. smaller HOWEVER he comes in today with a wound on the right foot laterally. blister most likely serosangquenous drainage 1/24; the patient continues to do well in terms of the plantar left foot which is continued to contract using silver alginate under the total contact cast HOWEVER the right lateral foot is bigger with denuded skin around the edges. I used pickups and a #15 scalpel to remove this this looks like the remanence of a large blister. Cannot rule out infection. Culture in this area I did last week showed Staphylococcus lugdunensis few colonies. I am going to try to address this with his [employer] antibiotic that is done so well on the left having linezolid and this should cover the staph 2/1; the patient's wound on his left foot which was the original plantar foot wound thick skin and eschar around the edges even in the total contact cast but the wound surface does not look too bad The real problem is on how his right lateral foot at roughly the base of the fifth metatarsal. The wound is completely necrotic more worrisome than that there is swelling around the edges of this. We have been using silver alginate on both wounds and Keystone on the right foot. Unfortunately I think he is going to require systemic antibiotics while we await cultures. He did not get the x-ray done that we ordered last week [lost the prescription 2/7; disappointingly in the area on the left foot which we are  treating with a total contact cast is still not closed although it  is much smaller. He continues to have a lot of callus around the wound edge. -Right lateral foot culture I did last week was negative x-ray also negative for osteomyelitis. 2/15: TCC silver alginate on the left and silver alginate on the right lateral. No real improvement in either area 05/26/2021: T oday, the wounds are roughly the same size as at his previous visit, post-debridement. He continues to endorse fairly substantial drainage, particularly on the right. He has been in a total contact cast on the left. There is still some callus surrounding this lesion. On the right, the periwound skin is quite macerated, along with surrounding callus. The center of the right-sided wound also has some dark, densely adherent material, which is very difficult to remove. 06/02/2021: Today, both wounds are slightly smaller. He has been using zinc oxide ointment around the right ulcer and the degree of maceration has improved markedly. There continues to be an area of nonviable tissue in the center of the right sided ulcer. The left-sided wound, which has been in the total contact cast. Appears clean and the degree of callus around it is less than previously. 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 He had to take the stairs and both wounds have increased in size. The left foot, which has been in a total contact cast, has developed a tunnel tracking to the lateral aspect of his foot. The nonviable tissue in the center of the right-sided ulcer remains recalcitrant to debridement. There is significant undermining surrounding the entirety of the left sided wound. 06/16/2021: The elevator at school has been fixed and the patient has been able to avoid putting as much weight on his wounds over the past week. We converted the left foot wound into a single lesion today, but despite this, the  wound is actually smaller. The base is healthy with limited periwound callus. On the right, the central necrotic area is still present. He continues to be quite macerated around the right sided wound, despite applying barrier cream. This does, however, have the benefit of softening the callus to make it more easily removable. 06/23/2021: Today, the left wound is smaller. The lateral aspect that had opened up previously is now closed. The wound base has a healthy bed of granulation tissue and minimal slough. Unfortunately, on the right, the wound is larger and continues to be fairly macerated. He has also reopened the wound at his right ankle. He thinks this is due to the gait he has adopted secondary to his total contact cast and boot. 06/30/2021: T oday, both wounds are a little bit larger. The lateral aspect on the left has remained closed. He continues to have significant periwound maceration. The culture that I took from the right sided wound grew a population of bacteria that is not covered by his current [facility] antibiotic. The center of the right- sided wound continues to appear necrotic with nonviable fat. It probes deeper today, but does not reach bone. [name], [name] ([id number]) [id number] Page 7 of 21 07/07/2021: The periwound maceration is a little bit less today. The right lateral foot wound has some areas that appear more viable and the necrotic center also looks a little bit better. The wound on the dorsal surface of his right foot near the ankle is contracting and the surface appears healthy. The left plantar wound surface looks healthy, but there is some new undermining on the medial portion. He did get his new Keystone  antibiotic and began applying that to the right foot wound on Saturday. 07/14/2021: The intake nurse reported substantial drainage from his wounds, but the periwound skin actually looks better than is typical for him. The wound on the dorsal  surface of his right foot near the ankle is smaller and just has a small open area underneath some dried eschar. The left plantar wound surface looks healthy and there has been no significant accumulation of callus. The right lateral foot wound looks quite a bit better, with the central portion, which typically appears necrotic, looking more viable albeit pale. 07/22/2021: His left foot is extremely macerated today. The wound is about the same size. The wound on the dorsal surface of his right foot near the ankle had closed, but he traumatized it removing the dressing and there is a tiny skin tear in that location. The right lateral foot wound is bigger, but the surface appears healthy. 07/30/2021: The wound on the dorsal surface of his right foot near the ankle is closed. The right lateral foot wound again is a little bit bigger due to some undermining. The periwound skin is in better condition, however. He has been applying zinc oxide. The wound surface is a little bit dry today. On the left, he does not have the substantial maceration that we frequently see. The wound itself is smaller and has a clean surface. 08/06/2021: Both wounds seem to have deteriorated over the past week. The right lateral foot wound has a dry surface but the periwound is boggy.. Overall wound dimensions are about the same. On the left, the wound is about the same size, but there is more undermining present underneath periwound callus. 08/13/2021: The right sided wound looks about the same, but on the left there has been substantial deterioration. The undermining continues to extend under periwound callus. Once this was removed, substantial extension of the wound was present. There is no odor or purulent drainage but clearly the wounds have broken down. 08/20/2021: The wounds look about the same today. He has been out of his total contact cast and has just been changing the dressings using topical Keystone with PolyMem Ag, Kerlix  and Ace bandages. The wound on the top of his right ankle has reopened but this is quite small. There was a little bit of purulent material that I expressed when examining this wound. 08/24/2021: After the aggressive debridement I performed at his last visit, the wounds actually look a little bit better today. They are smaller with the exception of the wound on the top of his right ankle which is a little bit bigger as some more skin pulled off when he was changing his dressing. We are using topical Keystone with PolyMem Ag Kerlix and Ace bandages. 09/02/2021: There has been really no change to any of his wounds. 09/16/2021: The patient was hospitalized last week with nausea, vomiting, and dehydration. He says that while he was in the hospital, his wounds were not really addressed properly. T oday, both plantar foot wounds are larger and the periwound skin is macerated. The wound on the dorsum of his right foot has a scab on the top. The right foot now has a crater where previously he had had nonviable fat. It looks as though this simply died and fell out. The periwound callus is wet. 09/24/2021: His wounds have deteriorated somewhat since his last visit. The wound on the dorsum of his right foot near his ankle is larger and has more nonviable tissue present. The  crater in his right foot is even deeper; I cannot quite palpate or probe to bone but I am sure it is close. The wound on his left plantar foot has an odd boggy area in the center that almost feels as though it has fluid within it. He has run out of his topical Keystone antibiotic. We are using silver alginate on his wounds. 09/29/2021: He has developed a new wound on the dorsum of his left foot near his ankle. He says he thinks his wrapping is rubbing in that site. I would concur with this as the wound on his right ankle is larger. The left foot looks about the same. The right foot has the crater that was present last week. No significant slough  accumulation, but his foot remains quite swollen and warm despite oral antibiotic therapy. 10/08/2021: All of his wounds look about the same as last week. He did not start his oral antibiotics that are prescribed until just a couple of days ago; his [employer] compounded antibiotics formula has been changed and he is awaiting delivery of the new recipe. His MRI that was scheduled for earlier this week was canceled as no prior authorization had been obtained; unfortunately the tech responsible sent an email to my old [employer] email, which I no longer use nor have access to. 10/18/2021: The wounds on his bilateral dorsal feet near the ankles are both improved. They are smaller and have just some eschar and slough buildup. The left plantar wound has a fair amount of undermining, but the surface is clean. There is some periwound callus accumulation. On the right plantar foot, there is nonviable fat leading to a deep tunnel that tracks towards his dorsal medial foot. There is periwound callus and slough accumulation, as well. His right foot and leg remain swollen as compared to the left. 10/25/2021: The wounds on his bilateral dorsal feet and at the ankles have broken down somewhat. They are little bit larger than last week. The left plantar wound continues to undermine laterally but the surface is clean. The right plantar foot wound shows some decreased depth in the tunnel tracking towards his dorsal medial foot. He has not yet had the Doppler study that I ordered; it sounds like there is some confusion about the scheduling of the procedure. In addition, the MRI was denied and I have taken steps to appeal the denial. 11/24/2021: Since our last visit, Mr. Muddiman was admitted to the hospital where an MRI suggested osteomyelitis. He was taken the operating room by podiatry. Bone biopsies were negative for osteomyelitis. They debrided his wounds and applied myriad matrix. He saw them last week and they removed  his staples. He is here today to continue his wound healing process. T oday, both of the dorsal foot/ankle wounds are substantially smaller. There is just a little eschar overlying the left sided wound and some eschar and slough on the right. The right plantar foot ulcer has the healthiest surface of granulation tissue that I have seen to date. A portion of the myriad matrix failed to take and was hanging loose. It appears that myriad morcells were placed into the tunnel closest to the dorsal portion of his foot. These have sloughed off. The left plantar foot ulcer is about the same size, but has a much healthier surface than in the past. Both plantar ulcers have callus and slough accumulation. 12/07/2021: Left dorsal foot/ankle wound is closed. The right dorsal foot/ankle wound is nearly closed and just has a  small open area with some eschar and slough. The right plantar foot wound has contracted quite a bit since our last visit. It has a healthy surface with just a little bit of slough accumulation and periwound callus buildup. The left plantar foot wound is about the same size but the surface appears healthy. There is a little slough and periwound callus on this side, as well. 12/15/2021: Both dorsal foot/ankle wounds are closed. The right plantar foot wound is substantially smaller than at our last visit. The tunneling that was present has nearly closed. There is just a little bit of slough buildup. The left plantar foot wound is also a little bit smaller today. The surface is the healthiest that I have ever seen it. Light slough and periwound callus accumulation on this side. 12/22/2021: The dorsal ankle/foot wounds remain closed. The right plantar foot wound continues to contract. There is still a bit of depth at the lateral portion of the wound but the surface has a good granulated appearance. The wound on the left is about the same size, the central indented portion is still adherent. It also is  clean with good granulation tissue present. The periwound skin and callus are a bit macerated, however. 12/29/2021: Both ankle wounds remain closed. The right plantar foot wound is less than half the size that it was last week. There is no depth any longer and the surface has just a little bit of slough and periwound callus. On the left, the wound is not much smaller, but the surface is healthier with good granulation tissue. There is also a little slough and periwound callus buildup. 01/05/2022: The right plantar foot wound continues to contract and is once again about half the size as it was last week. There is just a little bit of surface slough and periwound callus. On the left, the depth of the wound has decreased and the diameter has started to contract. It is also very clean with just a little slough and biofilm present. [name], [name] ([id number]) [id number] Page 8 of 21 01/12/2022: The right plantar foot wound is smaller again today. There is just some periwound callus and slough accumulation. On the left, there is a fair amount of undermining and the surface is a little boggy, but no other significant change. 01/19/2022: The right plantar wound continues to contract. There is minimal periwound callus accumulation and just a light layer of slough on the surface. On the left, the surface looks better, but there is fairly significant undermining near the 11:00 portion of the wound, aiming towards his great toe. The skin overlying this area is very healthy and has a good fat layer present. No malodor or purulent drainage. 01/26/2022: The right sided wound continues to contract and has just a light layer of slough on the surface. Minimal periwound callus. On the left, he still has substantial undermining and the tissue surface is not as robust as I would like to see. No malodor or purulent drainage, but he did say he had a lot of serous drainage over the  weekend. 02/02/2022: The right foot wound is about a quarter of the size that it was at his last visit. There is just a little slough on the surface and some periwound callus. On the left, the undermining persists and the tissue is still more pale, but he does not seem to have had as much drainage over the last weekend. We are waiting on a wound VAC. 02/09/2022: His right foot has healed. He received  the wound VAC, but did not bring it to clinic with him today. The lateral aspect of the left foot wound has come in a little, but he still has substantial undermining on the medial and distal portion of the wound. Still with macerated periwound callus. 02/16/2022: The wound VAC was applied last Friday. T oday, there has been tremendous improvement in the surface of the wound bed. The undermined portion of the wound has come in a bit. There is still some overhanging dead skin. Overall the wound looks much better. 02/23/2022: No significant change in the overall wound dimensions, but the wound surface continues to improve and appears healthier and more robust. There is some periwound callus accumulation and overhanging skin. No concern for infection. 03/02/2022: Although the measurements taken in clinic today were unchanged, the visual appearance of the wound looks like it is smaller and the undermining/tunneling is filling with flesh. There is less periwound tissue maceration than usual. No malodor or purulent drainage. No concern for infection. 03/09/2022: The undermining has come in by couple of millimeters. The periwound is a little bit more macerated this week. No concern for infection. 12/13; substantial wound on the left plantar foot. We are using silver collagen and a wound VAC. 03/23/2022: The undermining continues to contract. The wound surface is a little bit dry. 03/30/2022: The undermining has essentially resolved. There is still some depth at the center of the wound. The wound surface has good  beefy tissue and the moisture balance is better. 04/06/2022: The wound dimensions are about the same, except that the depth is beginning to fill in. He has had more drainage this week, but there is no obvious concern for infection. 04/13/2022: He has built up a little bit of callus around the edges of the wound, creating some undermining. Otherwise the wound looks fairly unchanged. 04/20/2022: The wound bed tissue looks very healthy and robust. Light biofilm on the surface. There is some built up wet callus around the wound edges, but no undermining. 04/27/2022: The wound surface continues to look very healthy. Although the wound dimensions measured the same, it looks smaller to me. No real callus accumulation this week. Minimal slough on the surface. 05/04/2022: The wound measured smaller and shallower this week. We are still awaiting insurance approval for Apligraf. 05/11/2022: The wound is about the same in terms of depth but measured a little bit narrower today. He has built up a rim of moist callus around the edges. He has been approved for Apligraf and we will apply that today. 05/18/2022: The wound measured a little bit smaller again today. No significant callus buildup this week. 05/25/2022: The wound was measured about the same size today, but the multiple nooks and crannies in the depths of the wound seem to be filling in. 06/01/2022: The wound dimensions are smaller today, with the exception of the depth. There is a little bit of moist callus around the edges. 06/08/2022: The wound continues to contract aside from the depth. Once again, there is moist callus around the edges. The odor is less profound this week. 3/13; patient presents for follow-up. He has had 5 applications of Apligraf and is currently not approved for more by insurance. He has also had a wound VAC in place. He has no issues or complaints today. 06/22/2022: The depth of the wound has come in somewhat. There is some undermining  created by skin encroaching over the top of the wound. The wound continues to have a pungent odor. 06/29/2022: The  wound dimensions are smaller, with the exception of the depth. There is some undermining created by overlying skin and callus. There is still some odor, but it is not as strong as it was on prior occasions. The culture that I took last week returned with a polymicrobial population, Pseudomonas dominant. He is currently taking levofloxacin and a new Keystone prescription should be delivered later this week. Electronic Signature(s) Signed: 06/29/2022 9:06:26 AM By: [name] MD FACS Entered By: [name] on 06/29/2022 09:06:25 -------------------------------------------------------------------------------- Physical Exam Details Patient Name: Date of Service: [name], [name] 06/29/2022 8:15 A M Medical Record Number: [id number] Patient Account Number: [id number] Date of Birth/Sex: Treating RN: June 27, 1985 (37 y.o. [name] Primary Care Provider: [name] Other Clinician: Referring Provider: Treating Provider/Extender: [name] in Treatment: [address], [name] ([id number]) [id number] Page 9 of 21 Constitutional . . . . no acute distress. Respiratory Normal work of breathing on room air. Notes 06/29/2022: The wound dimensions are smaller, with the exception of the depth. There is some undermining created by overlying skin and callus. There is still some odor, but it is not as strong as it was on prior occasions. Electronic Signature(s) Signed: 06/29/2022 9:06:58 AM By: [name] MD FACS Entered By: [name] on 06/29/2022 09:06:58 -------------------------------------------------------------------------------- Physician Orders Details Patient Name: Date of Service: [name], [name] 06/29/2022 8:15 A M Medical Record Number: [id number] Patient Account Number: [id number] Date of  Birth/Sex: Treating RN: 01-14-1986 (37 y.o. 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 - Dr. [name] - Room 1 with [name] Return A Wednesday 4/3 @ 08:15 am Anesthetic (In clinic) Topical Lidocaine 4% applied to wound bed Bathing/ Shower/ Hygiene May shower and wash wound with soap and water. Negative Presssure Wound Therapy Wound #3 Left,Lateral,Plantar Foot Wound Vac to wound continuously at 143mm/hg pressure Black Foam Edema Control - Lymphedema / SCD / Other Bilateral Lower Extremities Avoid standing for long periods of time. Exercise regularly Compression stocking or Garment 20-30 mm/Hg pressure to: - to both legs daily Off-Loading Open toe surgical shoe to: - Both feet Additional Orders / Instructions Follow Nutritious Diet Non Wound Condition pply the following to affected area as directed: - continue to apply foam donut or cushion to healed right plantar foot A Wound Treatment Wound #3 - Foot Wound Laterality: Plantar, Left, Lateral Cleanser: Soap and Water 3 x Per Week/30 Days Discharge Instructions: May shower and wash wound with dial antibacterial soap and water prior to dressing change. Cleanser: Vashe 5.8 (oz) 3 x Per Week/30 Days Discharge Instructions: Cleanse the wound with Vashe prior to applying a clean dressing, let sit on wound for 5-10 minutesusing gauze sponges, not tissue or cotton balls. Peri-Wound Care: Skin Prep (Generic) 3 x Per Week/30 Days Discharge Instructions: Use skin prep as directed [place], [name] ([id number]) [id number]  Page 10 of 21 Topical: keystone antibiotic compound 3 x Per Week/30 Days Discharge Instructions: apply to base of wound Prim Dressing: Promogran Prisma Matrix, 4.34 (sq in) (silver collagen) (Dispense As Written) 3 x Per Week/30 Days ary Discharge Instructions: Moisten collagen with saline or hydrogel Prim Dressing: VAC ary 3 x Per Week/30 Days Secondary  Dressing: Zetuvit Absorbent Pad, 4x4 (in/in) (Dispense As Written) 3 x Per Week/30 Days Secured With: Elastic Bandage 4 inch (ACE bandage) (Generic) 3 x Per Week/30 Days Discharge Instructions: Secure with ACE bandage as directed. Secured With: 34M Medipore Public affairs consultant Surgical T 2x10 (in/yd) (Generic) 3 x Per Week/30 Days ape Discharge Instructions: Secure with tape as directed. Compression Wrap: Kerlix Roll 4.5x3.1 (in/yd) (Generic) 3 x Per Week/30 Days Discharge Instructions: Apply Kerlix and Coban compression as directed. Electronic Signature(s) Signed: 06/29/2022 9:16:27 AM By: [name] MD FACS Entered By: [name] on 06/29/2022 09:07:21 -------------------------------------------------------------------------------- Problem List Details Patient Name: Date of Service: [name], [name] 06/29/2022 8:15 A M Medical Record Number: [id number] Patient Account Number: [id number] Date of Birth/Sex: Treating RN: April 11, 1985 (37 y.o. [name] Primary Care Provider: [name] Other Clinician: Referring Provider: Treating Provider/Extender: [name] in Treatment: 139 Active Problems ICD-10 Encounter Code Description Active Date MDM Diagnosis L97.528 Non-pressure chronic ulcer of other part of left foot with other specified 10/24/2019 No Yes severity E11.621 Type 2 diabetes mellitus with foot ulcer 10/24/2019 No Yes Inactive Problems ICD-10 Code Description Active Date Inactive Date L97.518 Non-pressure chronic ulcer of other part of right foot with other specified severity  07/14/2020 07/14/2020 L97.518 Non-pressure chronic ulcer of other part of right foot with other specified severity 10/24/2019 10/24/2019 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, right ankle and foot 10/24/2019 10/24/2019 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 08/10/2020 08/10/2020 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, left ankle and foot 10/24/2019 10/24/2019 L97.322 Non-pressure chronic ulcer of left ankle with fat layer exposed 09/29/2021 09/29/2021 [place], [name] ([id number]) [phone].pdf Page 11 of 21 B95.62 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 10/24/2019 10/24/2019 classified elsewhere Resolved Problems Electronic Signature(s) Signed: 06/29/2022 9:04:29 AM By: [name] MD FACS Entered By: [name] on 06/29/2022 09:04:29 -------------------------------------------------------------------------------- Progress Note Details Patient Name: Date of Service: [name], [name] 06/29/2022 8:15 A M Medical Record Number: [id number] Patient Account Number: [id number] Date of Birth/Sex: Treating RN: [birth year] (37 y.o. 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 He comes in for review of a wound over the plantar fifth metatarsal head extending into the lateral part of the foot. He was  followed for this previously by his podiatrist Dr. Cornelius Moras. As the patient tells his story he went to see podiatry first for a swelling he developed on the lateral part of his fifth metatarsal head in May. He states this was "open" by podiatry and the area closed. He was followed up in June and it was again opened callus removed and it closed promptly. There were plans being made for surgery on the fifth metatarsal head in June however his blood sugar was apparently too high for anesthesia. Apparently the area was debrided and opened again in June and it is never closed since. Looking over the records from podiatry I am really not able to follow this. It was clear when he  was first seen it was before 5/14 at that point he already had a wound. By 5/17 the ulcer was resolved. I do not see anything about a procedure. On 5/28 noted to have pre-ulcerative moderate keratosis. X-ray noted 1/5 contracted toe and tailor's bunion and metatarsal deformity. On a visit date on 09/28/2018 the dorsal part of the left foot it healed and resolved. There was concern about swelling in his lower extremity he was sent to the ER.. As far as I can tell he was seen in the ER on 7/12 with an ulcer on his left foot. A DVT rule out of the left leg was negative. I do not think I have complete records from podiatry but I am not able to verify the procedures this patient states he had. He states after the last procedure the wound has never closed although I am not able to follow this in the records I have from podiatry. He has not had a recent x-ray The patient has been using Neosporin on the wound. He is wearing a Darco shoe. He is still very active up on his foot working and exercising. Past medical history; type 2 diabetes ketosis-prone, leg swelling with a negative DVT study in July. Non-smoker ABI in our clinic was 0.85 on the left 10/16; substantial wound on the plantar left fifth met head extending laterally almost to the  dorsal fifth MTP. We have been using silver alginate we gave him a Darco forefoot off loader. An x-ray did not show evidence of osteomyelitis did note soft tissue emphysema which I think was due to gas tracking through an open wound. There is no doubt in my mind he requires an MRI 10/23; MRI not booked until 3 November at the earliest this is largely due to his glucose sensor in the right arm. We have been using silver alginate. There has been an improvement 10/29; I am still not exactly sure when his MRI is booked for. He says it is the third but it is the 10th in epic. This definitely needs to be done. He is running a low-grade fever today but no other symptoms. No real improvement in the 1 02/26/2019 patient presents today for a follow-up visit here in our clinic he is last been seen in the clinic on October 29. Subsequently we were working on getting MRI to evaluate and see what exactly was going on and where we would need to go from the standpoint of whether or not he had osteomyelitis and again what treatments were going be required. Subsequently the patient ended up being admitted to the hospital on 02/07/2019 and was discharged on 02/14/2019. This is a somewhat interesting admission with a discharge diagnosis of pneumonia due to COVID-19 although he was positive for COVID-19 when tested at the urgent care but negative x2 when he was actually in the hospital. With that being said he did have acute respiratory failure with hypoxia and it was noted he also have a left foot ulceration with osteomyelitis. With that being said he did require oxygen for his pneumonia and I level 4 L. He was placed on antivirals and steroids for the COVID-19. He was also transferred to the [facility] at one point. Nonetheless he did subsequently discharged home and since being home has done much better in that regard. The CT angiogram did not show any pulmonary embolism. With regard to the osteomyelitis  the patient was placed on vancomycin and Zosyn while in the hospital but has been  changed to Augmentin at discharge. It was also recommended that he follow- up with wound care and podiatry. Podiatry however wanted him to see [name] according to the patient prior to them doing anything further. His hemoglobin A1c was 9.9 as noted in the hospital. Have an MRI of the left foot performed while in the hospital on 02/04/2019. 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 There is an overlying plantar open wound noted an abscess tracking back along the lateral aspect of the fifth metatarsal shaft. There is otherwise diffuse cellulitis and mild fasciitis without findings of polymyositis. The patient did have recently pneumonia secondary to COVID-19 I looked in the chart through epic and it does appear that the patient may need to have an additional x-ray just to ensure everything is cleared and that he has no airspace disease prior to putting him into the [name]. 03/05/2019; patient was readmitted to the clinic last week. He was hospitalized twice for a viral upper respiratory tract infection from 11/1 through 11/4 and then 11/5 through 11/12 ultimately this turned out to be Covid pneumonitis. Although he was discharged on oxygen he is not using it. He says he feels fine. He has no exercise limitation no cough no sputum. His O2 sat in our clinic today was 100% on room air. He did manage to have his MRI which showed septic arthritis at the fifth MTP joint and osteomyelitis involving the fifth metatarsal head and proximal phalanx. [name], [name] ([id number]) [id number] Page 12 of 21 He received Vanco and Zosyn in the hospital and then was discharged on 2 weeks of Augmentin. I do not see any relevant cultures. He was supposed to follow-up with infectious disease but I do not see that he has an  appointment. 12/8; patient saw Dr. Novella Olive of infectious disease last week. He felt that he had had adequate antibiotic therapy. He did not go to follow-up with Dr. Amalia Hailey of podiatry and I have again talked to him about the pros and cons of this. He does not want to consider a ray amputation of this time. He is aware of the risks of recurrence, migration etc. He started HBO today and tolerated this well. He can complete the Augmentin that I gave him last week. I have looked over the lab work that Dr. Chana Bode ordered his C-reactive protein was 3.3 and his sedimentation rate was 17. The C-reactive protein is never really been measurably that high in this patient 12/15; not much change in the wound today however he has undermining along the lateral part of the foot again more extensively than last week. He has some rims of epithelialization. We have been using silver alginate. He is undergoing hyperbarics but did not dive today 12/18; in for his obligatory first total contact cast change. Unfortunately there was pus coming from the undermining area around his fifth metatarsal head. This was cultured but will preclude reapplication of a cast. He is seen in conjunction with HBO 12/24; patient had staph lugdunensis in the wound in the undermining area laterally last time. We put him on doxycycline which should have covered this. The wound looks better today. I am going to give him another week of doxycycline before reattempting the total contact cast 12/31; the patient is completing antibiotics. Hemorrhagic debris in the distal part of the wound with some undermining distally. He also had hyper granulation. Extensive debridement with a #5 curette. The infected area  that was on the lateral part of the fifth met head is closed over. I do not think he needs any more antibiotics. Patient was seen prior to HBO. Preparations for a total contact cast were made in the cast will be placed post hyperbarics 04/11/19;  once again the patient arrives today without complaint. He had been in a cast all week noted that he had heavy drainage this week. This resulted in large raised areas of macerated tissue around the wound 1/14; wound bed looks better slightly smaller. Hydrofera Blue has been changing himself. He had a heavy drainage last week which caused a lot of maceration around the wound so I took him out of a total contact cast he says the drainage is actually better this week He is seen today in conjunction with HBO 1/21; returns to clinic. He was up in [place] for a day or 2 attending a funeral. He comes back in with the wound larger and with a large area of exposed bone. He had osteomyelitis and septic arthritis of the fifth left metatarsal head while he was in hospital. He received IV antibiotics in the hospital for a prolonged period of time then 3 weeks of Augmentin. Subsequently I gave him 2 weeks of doxycycline for more superficial wound infection. When I saw this last week the wound was smaller the surface of the wound looks satisfactory. 1/28; patient missed hyperbarics today. Bone biopsy I did last time showed Enterococcus faecalis and Staphylococcus lugdunensis . He has a wide area of exposed bone. We are going to use silver alginate as of today. I had another ethical discussion with the patient. 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 In this situation I think the likelihood of healing this is low. Therefore I have recommended a ray amputation and with the patient's agreement I have referred him to Dr. Doran Durand. The other issue is that his compliance with hyperbarics has been minimal because of his work schedule and given his underlying decision I am going to stop this today READMISSION 10/24/2019 MRI 09/29/2019 left foot IMPRESSION: 1. Apparent skin ulceration inferior and lateral to the 5th metatarsal base with underlying heterogeneous T2 signal  and enhancement in the subcutaneous fat. Small peripherally enhancing fluid collections along the plantar and lateral aspects of the 5th metatarsal base suspicious for abscesses. 2. Interval amputation through the mid 5th metatarsal with nonspecific low-level marrow edema and enhancement. Given the proximity to the adjacent soft tissue inflammatory changes, osteomyelitis cannot be excluded. 3. The additional bones appear unremarkable. MRI 09/29/2019 right foot IMPRESSION: 1. Soft tissue ulceration lateral to the 5th MTP joint. There is low-level T2 hyperintensity within the 4th and 5th metatarsal heads and adjacent proximal phalanges without abnormal T1 signal or cortical destruction. These findings are nonspecific and could be seen with early marrow edema, hyperemia or early osteomyelitis. No evidence of septic joint. 2. Mild tenosynovitis and synovial enhancement associated with the extensor digitorum tendons at the level of the midfoot. 3. Diffuse low-level muscular T2 hyperintensity and enhancement, most consistent with diabetic myopathy. LEFT FOOT BONE Methicillin resistant staphylococcus aureus Staphylococcus lugdunensis MIC MIC CIPROFLOXACIN >=8 RESISTANT Resistant <=0.5 SENSI... Sensitive CLINDAMYCIN <=0.25 SENS... Sensitive >=8 RESISTANT Resistant ERYTHROMYCIN >=8 RESISTANT Resistant >=8 RESISTANT Resistant GENTAMICIN <=0.5 SENSI... Sensitive <=0.5 SENSI... Sensitive Inducible Clindamycin NEGATIVE Sensitive NEGATIVE Sensitive OXACILLIN >=4 RESISTANT Resistant 2 SENSITIVE Sensitive RIFAMPIN <=0.5 SENSI... Sensitive <=0.5 SENSI... Sensitive TETRACYCLINE <=1 SENSITIVE Sensitive <=1 SENSITIVE Sensitive TRIMETH/SULFA <=10 SENSIT Sensitive <=10 SENSIT Sensitive ... Marland Kitchen..  VANCOMYCIN 1 SENSITIVE Sensitive <=0.5 SENSI... Sensitive Right foot bone . Component 3 wk ago Specimen Description BONE Special Requests RIGHT 4 METATARSAL SAMPLE B Gram Stain NO WBC SEEN NO ORGANISMS  SEEN Culture RARE METHICILLIN RESISTANT STAPHYLOCOCCUS AUREUS NO ANAEROBES ISOLATED Performed at [facility], [place] [address], [place], [place] [address] Report Status 10/08/2019 FINAL [name], [name] ([id number]) [id number] Page 13 of 21 Organism ID, Bacteria METHICILLIN RESISTANT STAPHYLOCOCCUS AUREUS Resulting Agency [facility] Susceptibility Methicillin resistant staphylococcus aureus MIC CIPROFLOXACIN >=8 RESISTANT Resistant CLINDAMYCIN <=0.25 SENS... Sensitive ERYTHROMYCIN >=8 RESISTANT Resistant GENTAMICIN <=0.5 SENSI... Sensitive Inducible Clindamycin NEGATIVE Sensitive OXACILLIN >=4 RESISTANT Resistant RIFAMPIN <=0.5 SENSI... Sensitive TETRACYCLINE <=1 SENSITIVE Sensitive TRIMETH/SULFA <=10 SENSIT Sensitive ... VANCOMYCIN 1 SENSITIVE Sensitive This is a patient we had in clinic earlier this year with a wound over his left fifth metatarsal head. 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 Doran Durand on 05/16/2019. He seems to have developed problems with wounds on his bilateral feet in June 2021 although he may have had problems earlier than this. He was in an urgent care with a right foot ulcer on 09/26/2019 and given a course of doxycycline. This was apparently after having trouble getting into see orthopedics. He was seen by podiatry on 09/28/2019 noted to have bilateral lower extremity ulcers including the left lateral fifth metatarsal base and the right subfifth met head. It was noted that had purulent drainage at that time. He required hospitalization from 6/20 through 7/2. This was because of worsening right foot wounds. He underwent bilateral operative incision and  drainage and bone biopsies bilaterally. Culture results are listed above. He has been referred back to clinic by Dr. Jacqualyn Posey of podiatry. He is also followed by Dr. Megan Salon who saw him yesterday. He was discharged from hospital on Zyvox Flagyl and Levaquin and yesterday changed to doxycycline Flagyl and Levaquin. His inflammatory markers on 6/26 showed a sedimentation rate of 129 and a C-reactive protein of 5. This is improved to 14 and 1.3 respectively. This would indicate improvement. ABIs in our clinic today were 1.23 on the right and 1.20 on the left 11/01/2019 on evaluation today patient appears to be doing fairly well in regard to the wounds on his feet at this point. Fortunately there is no signs of active infection at this time. No fevers, chills, nausea, vomiting, or diarrhea. He currently is seeing infectious disease and still under their care at this point. Subsequently he also has both wounds which she has not been using collagen on as he did not receive that in his packaging he did not call us and let us know that. Apparently that just was missed on the order. Nonetheless we will get that straightened out today. 8/9-Patient returns for bilateral foot wounds, using Prisma with hydrogel moistened dressings, and the wounds appear stable. Patient using surgical shoes, avoiding much pressure or weightbearing as much as possible 8/16; patient has bilateral foot wounds. 1 on the right lateral foot proximally the other is on the left mid lateral foot. Both required debridement of callus and thick skin around the wounds. We have been using silver collagen 8/27; patient has bilateral lateral foot wounds. The area on the left substantially surrounded by callus and dry skin. This  was removed from the wound edge. The underlying wound is small. The area on the right measured somewhat smaller today. We've been using silver collagen the patient was on antibiotics for underlying osteomyelitis in the left  foot. Unfortunately I did not update his antibiotics during today's visit. 9/10 I reviewed Dr. Hale Bogus last notes he felt he had completed antibiotics his inflammatory markers were reasonably well controlled. He has a small wound on the lateral left foot and a tiny area on the right which is just above closed. He is using Hydrofera Blue with border foam he has bilateral surgical shoes 9/24; 2 week f/u. doing well. right foot is closed. left foot still undermined. 10/14; right foot remains closed at the fifth met head. The area over the base of the left fifth metatarsal has a small open area but considerable undermining towards the plantar foot. Thick callus skin around this suggests an adequate pressure relief. We have talked about this. He says he is going to go back into his cam boot. I suggested a total contact cast he did not seem enamored with this suggestion 10/26; left foot base of the fifth metatarsal. Same condition as last time. He has skin over the area with an open wound however the skin is not adherent. He went to see Dr. Earleen Newport who did an x-ray and culture of his foot I have not reviewed the x-ray but the patient was not told anything. He is on doxycycline 11/11; since the patient was last here he was in the emergency room on 10/30 he was concerned about swelling in the left foot. They did not do any cultures or x-rays. They changed his antibiotics to cephalexin. Previous culture showed group B strep. The cephalexin is appropriate as doxycycline has less than predictable coverage. Arrives in clinic today with swelling over this area under the wound. He also has a new wound on the right fifth metatarsal head 11/18; the patient has a difficult wound on the lateral aspect of the left fifth metatarsal head. The wound was almost ballotable last week I opened it slightly expecting to see purulence however there was just bleeding. I cultured this this was negative. X-ray unchanged. We are  trying to get an MRI but I am not sure were going to be able to get this through his insurance. He also has an area on the right lateral fifth metatarsal head this looks healthier 12/3; the patient finally got our MRI. Surprisingly this did not show osteomyelitis. I did show the soft tissue ulceration at the lateral plantar aspect of the fifth metatarsal base with a tiny residual 6 mm abscess overlying the superficial fascia I have tried to culture this area I have not been able to get this to grow anything. Nevertheless the protruding tissue looks aggravated. I suspect we should try to treat the underlying "abscess with broad-spectrum antibiotics. I am going to start him on Levaquin and Flagyl. He has much less edema in his legs and I am going to continue to wrap his legs and see him weekly 12/10. I started Levaquin and Flagyl on him last week. He just picked up the Flagyl apparently there was some delay. The worry is the wound on the left fifth metatarsal base which is substantial and worsening. His foot looks like he inverts at the ankle making this a weightbearing surface. Certainly no improvement in fact I think the measurements of this are somewhat worse. We have been using 12/17; he apparently just got the Levaquin  yesterday this is 2 weeks after the fact. He has completed the Flagyl. The area over the left fifth metatarsal base still has protruding granulation tissue although it does not look quite as bad as it did some weeks ago. He has severe bilateral lymphedema although we have not been treating him for wounds on his legs this is definitely going to require compression. There was so much edema in the left I did not wish to put him in a total contact cast today. I am going to increase his compression from 3-4 layer. The area on the right lateral fifth met head actually look quite good and superficial. 12/23; patient arrived with callus on the right fifth met head and the substantial hyper  granulated callused wound on the base of his fifth metatarsal. He says he is completing his Levaquin in 2 days but I do not think that adds up with what I gave him but I will have to double check this. We are using Hydrofera Blue on both areas. My plan is to put the left leg in a cast the week after New Year's 04/06/2020; patient's wounds about the same. Right lateral fifth metatarsal head and left lateral foot over the base of the fifth metatarsal. There is undermining on the left lateral foot which I removed before application of total contact cast continuing with Hydrofera Blue new. Patient tells me he was seen by endocrinology today lab work was done [Dr. Kerr]. Also wondering whether he was referred to cardiology. I went over some lab work from previously does not have chronic renal failure certainly not nephrotic range proteinuria he does have very poorly controlled diabetes but this is not his most updated lab work. Hemoglobin A1c has been over 11 1/10; the patient had a considerable amount of leakage towards mid part of his left foot with macerated skin however the wound surface looks better the area on the right lateral fifth met head is better as well. I am going to change the dressing on the left foot under the total contact cast to silver alginate, continue with Hydrofera Blue on the right. 1/20; patient was in the total contact cast for 10 days. Considerable amount of drainage although the skin around the wound does not look too bad on the left foot. The area on the right fifth metatarsal head is closed. Our nursing staff reports large amount of drainage out of the left lateral foot wound 1/25; continues with copious amounts of drainage described by our intake staff. PCR culture I did last week showed E. coli and Enterococcus faecalis and low quantities. Multiple resistance genes documented including extended spectrum beta lactamase, MRSA, MRSE, quinolone, tetracycline. The wound is not  quite as good this week as it was 5 days ago but about the same size 2/3; continues with copious amounts of malodorous drainage per our intake nurse. The PCR culture I did 2 weeks ago showed E. coli and low quantities of Rack, [name] ([id number]) [id number] Page 14 of 21 Enterococcus. There were multiple resistance genes detected. I put Neosporin on him last week although this does not seem to have helped. The wound is slightly deeper today. Offloading continues to be an issue here although with the amount of drainage she has a total contact cast is just not going to work 2/10; moderate amount of drainage. Patient reports he cannot get his stocking on over the dressing. I told him we have to do that the nurse gave him suggestions on how to make  this work. The wound is on the bottom and lateral part of his left foot. Is cultured predominantly grew low amounts of Enterococcus, E. coli and anaerobes. There were multiple resistance genes detected including extended spectrum beta lactamase, quinolone, tetracycline. I could not think of an easy oral combination to address this so for now I am going to do topical antibiotics provided by [facility] I think the main agents here are vancomycin and an aminoglycoside. We have to be able to give him access to the wounds to get the topical antibiotic on 2/17; moderate amount of drainage this is unchanged. He has his Keystone topical antibiotic against the deep tissue culture organisms. He has been using this and changing the dressing daily. Silver alginate on the wound surface. 2/24; using Keystone antibiotic with silver alginate on the top. He had too much drainage for a total contact cast at one point although I think that is improving and I think in the next week or 2 it might be possible to replace a total contact cast I did not do this today. In general the wound surface looks healthy however he continues to have thick rims of skin  and subcutaneous tissue around the wide area of the circumference which I debrided 06/04/2020 upon evaluation today patient appears to be doing well in regard to his wound. I do feel like he is showing signs of improvement. There is little bit of callus and dead tissue around the edges of the wound as well as what appears to be a little bit of a sinus tract that is off to the side laterally I would perform debridement to clear that away today. 3/17; left lateral foot. The wound looks about the same as I remember. Not much depth surface looks healthy. No evidence of infection 3/25; left lateral foot. Wound surface looks about the same. Separating epithelium from the circumference. There really is no evidence of infection here however not making progress by my view 3/29; left lateral foot. Surface of the wound again looks reasonably healthy still thick skin and subcutaneous tissue around the wound margins. There is no evidence of infection. One of the concerns being brought up by the nurses has again the amount of drainage vis--vis continued use of a total contact cast 4/5; left lateral foot at roughly the base of the fifth metatarsal. Nice healthy looking granulated tissue with rims of epithelialization. The overall wound measurements are not any better but the tissue looks healthy. The only concern is the amount of drainage although he has no surrounding maceration with what we have been doing recently to absorb fluid and protect his skin. He also has lymphedema. 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 He states that the kerlix/coban under the TCC rolled down and could not get it back up. He has been using an offloading scooter and has somehow hurt his  right foot using this device. This happened last week. He states that the side of his right foot developed a blister and opened. The top of his foot also has a few small open wounds he thinks is due to his socks rubbing in his shoes. He has not been using any dressings to the wound. He denies purulent drainage, fever/chills or  erythema to the wounds. 4/22; patient presents for 1 week follow-up. He developed new wounds to the right foot that were evaluated at last clinic visit. He continues to have a total contact cast to the left leg and he reports no issues. He has been using silver collagen to the right foot wounds with no issues. He denies purulent drainage, fever/chills or erythema to the right foot wounds. He has no complaints today 4/25; patient presents for 1 week follow-up. He has a total contact cast of the left leg and reports no issues. He has been using silver alginate to the right foot wound. He denies purulent drainage, fever/chills or erythema to the right foot wounds. 5/2 patient presents for 1 week follow-up. T contact cast on the left. The wound which is on the base of the plantar foot at the base of the fifth metatarsal otal actually looks quite good and dimensions continue to gradually contract. HOWEVER the area on the right lateral fifth metatarsal head is much larger than what I remember from 2 weeks ago. Once more is he has significant levels of hypergranulation. Noteworthy that he had this same hyper granulated response on his wound on the left foot at one point in time. So much so that he I thought there was an underlying fluid collection. Based on this I think this just needs debridement. 5/9; the wound on the left actually continues to be gradually smaller with a healthy surface. Slight amount of drainage and maceration of the skin around but not too bad. However he has a large wound over the right fifth metatarsal head very much in the same configuration as his left foot  wound was initially. I used silver nitrate to address the hyper granulated tissue no mechanical debridement 5/16; area on the left foot did not look as healthy this week deeper thick surrounding macerated skin and subcutaneous tissue. oo The area on the right foot fifth met head was about the same oo The area on the right ankle that we identified last week is completely broken down into an open wound presumably a stocking rubbing issue 5/23; patient has been using a total contact cast to the left side. He has been using silver alginate underneath. He has also been using silver alginate to the right foot wounds. He has no complaints today. He denies any signs of infection. 5/31; the left-sided wound looks some better measure smaller surface granulation looks better. We have been using silver alginate under the total contact cast oo The large area on his right fifth met head and right dorsal foot look about the same still using silver alginate 6/6; neither side is good as I was hoping although the surface area dimensions are better. A lot of maceration on his left and right foot around the wound edge. Area on the dorsal right foot looks better. He says he was traveling. I am not sure what does the amount of maceration around the plantar wounds may be drainage issues 6/13; in general the wound surfaces look quite good on both sides. Macerated skin and raised edges around the wound required debridement although in general especially on the left the surface area seems improved. oo The area on the right dorsal ankle is about the same I thought this would not be such a problem to close 6/20; not much change in either wound although the one on the right looks a little better. Both wounds have thick macerated edges to the skin requiring debridements. We have been  using silver alginate. The area on the dorsal right ankle is still open I thought this would be closed. 6/28; patient comes in today with a  marked deterioration in the right foot wound fifth met head. Wide area of exposed bone this is a drastic change from last time. The area on the left there we have been casting is stagnant. We have been using silver alginate in both wound areas. 7/5; bone culture I did for PCR last time was positive for Pseudomonas, group B strep, Enterococcus and Staph aureus. There was no suggestion of methicillin resistance or ampicillin resistant genes. This was resistant to tetracycline however He comes into the clinic today with the area over his right plantar fifth metatarsal head which had been doing so well 2 weeks ago completely necrotic feeling bone. I do not know that this is going to be salvageable. The left foot wound is certainly no smaller but it has a better surface and is superficial. 7/8; patient called in this morning to say that his total contact cast was rubbing against his foot. He states he is doing fine overall. He denies signs of infection. 7/12; continued deterioration in the wound over the right fifth metatarsal head crumbling bone. This is not going to be salvageable. The patient agrees and wants to be referred to Dr. Doran Durand which we will attempt to arrange as soon as possible. I am going to continue him on antibiotics as long as that takes so I will renew those today. The area on the left foot which is the base of the fifth metatarsal continues to look somewhat better. Healthy looking tissue no depth no debridement is necessary here. 7/20; the patient was kindly seen by Dr. Doran Durand of orthopedics on 10/19/2020. 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 Towards this end we have taken him out of the total contact cast on the left we will put him in a wrap with Hydrofera Blue. As I understand things surgery is planned for 7/21 7/27; patient had his surgery last Thursday. 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 Apparently everything went well we did not still disturb that today The area on the left foot actually looks quite good. He has been much less mobile which probably explains this he did not seem to do well in the total contact cast secondary to drainage and maceration I think. We have been using [place], [name] ([id number]) [id number] Page 15 of 21 11/09/2020 upon evaluation today patient appears to be doing well with regard to his plantar foot ulcer on the left foot. Fortunately there is no evidence of active infection at this time. No fevers, chills, nausea, vomiting, or diarrhea. Overall I think that he is actually doing extremely well. Nonetheless I do believe that he is staying off of this more following the surgery in his right foot that is the reason the left is doing so great. 8/16; left plantar foot wound. This looks smaller than the last time I saw this he is using Hydrofera Blue. The surgical wound on the right foot is being followed by Dr. Doran Durand we did not look at this today. He has surgical shoes on both feet 8/23; left plantar foot wound not as good this week. Surrounding macerated skin and subcutaneous tissue everything looks moist and wet. I do not think he is offloading this adequately. He is using a  surgical shoe Apparently the right foot surgical wound is not open although I did not check his foot 8/31; left plantar foot lateral aspect. Much improved this week. He has no maceration. Some improvement in the surface area of the wound but most impressively the depth is come in we are using silver alginate. The patient is a Product/process development scientist. He is asked that we write him a letter so he can go back to work. I have also tried to see if we can write something that will allow him to limit the amount of time that he is on his foot at work. Right now he tells me his classrooms are next door to each other however he has  to supervise lunch which is well across. Hopefully the latter can be avoided 9/6; I believe the patient missed an appointment last week. He arrives in today with a wound looking roughly the same certainly no better. Undermining laterally and also inferiorly. We used molecuLight today in training with the patient's permission.. We are using silver alginate 9/21 wound is measuring bigger this week although this may have to do with the aggressive circumferential debridement last week in response to the blush fluorescence on the MolecuLight. Culture I did last week showed significant MSSA and E. coli. I put him on Augmentin but he has not started it yet. We are also going to send this for compounded antibiotics at [facility]. There is no evidence of systemic infection 9/29; silver alginate. His Keystone arrived. He is completing Augmentin in 2 days. Offloading in a cam boot. Moderate drainage per our intake staff 10/5; using silver alginate. He has been using his [place]. He has completed his Augmentin. Per our intake nurse still a lot of drainage, far too much to consider a total contact cast. Wound measures about the same. He had the same undermining area that I defined last week from a roughly 11-3. I remove this today 10/12; using silver alginate he is using the [place]. He comes in for a nurse visit hence we are applying [employer] twice a week. Measuring slightly better today and less notable drainage. Extensive debridement of the wound edge last time 10/18; using topical Keystone and silver alginate and a soft cast. Wound measurements about the same. Drainage was through his soft cast. We are changing this twice a week Tuesdays and Friday 10/25; comes in with moderate drainage. Still using Keystone silver alginate and a soft cast. Wound dimensions completely the same.He has a lot of edema in the left leg he has lymphedema. Asking for [name] to consider wrapping him as he cannot get his stocking on over  the soft cast 11/2; comes in with moderate to large drainage slightly smaller in terms of width we have been using [place]. His wound looks satisfactory but not much improvement 11/4; patient presents today for obligatory cast change. Has no issues or complaints today. He denies signs of infection. 11/9; patient traveled this weekend to [place], was on the cast quite a bit. Staining of the cast with black material from his walking boot. Drainage was not quite as bad as we feared. Using silver alginate and Keystone 11/16; we do not have size for cast therefore we have been putting a soft cast on him since the change on Friday. Still a significant amount of drainage necessitating changing twice a week. We have been using the Keystone at cast changes either hard or soft as well as silver alginate Comes in the clinic with things actually looking  fairly good improvement in width. He says his offloading is about the same 02/24/2021 upon evaluation today patient actually comes back in and is doing excellent in regard to his foot ulcer this is significantly smaller even compared to the last visit. The soft cast seems to have done extremely well for him which is great news. I do not see any signs of infection minimal debridement will be needed today. 11/30; left lateral foot much improved half a centimeter improvement in surface area. No evidence of infection. He seems to be doing better with the soft cast in the TCC therefore we will continue with this. He comes back in later in the week for a change with the nurses. This is due to drainage 12/6; no improvement in dimensions. Under illumination some debris on the surface we have been using silver alginate, soft cast. If there is anything optimistic here he seems to have have less drainage 12/13. Dimensions are improved both length and width and slightly in depth. Appears to be quite healthy today. Raised edges of this thick skin and callus around the edges  however. He is in a soft cast were bringing him back once for a change on Friday. Drainage is better 12/20. Dimensions are improved. He still has raised edges of thick skin and callus around the edges. We are using a soft cast 12/28; comes in today with thick callus around the wound. Using silver under alginate under a soft cast. I do not think there is much improvement in any measurement 2023 04/06/2021; patient was put in a total contact cast. Unfortunately not much change in surface area 1/10; not much different still thick callus and skin around the edge in spite of the total contact cast. This was just debrided last week we have been using the [facility] compounded antibiotic and silver alginate under a total contact cast 1/18 the patient's wound on the left side is doing nicely. smaller HOWEVER he comes in today with a wound on the right foot laterally. blister most likely serosangquenous drainage 1/24; the patient continues to do well in terms of the plantar left foot which is continued to contract using silver alginate under the total contact cast HOWEVER the right lateral foot is bigger with denuded skin around the edges. I used pickups and a #15 scalpel to remove this this looks like the remanence of a large blister. Cannot rule out infection. Culture in this area I did last week showed Staphylococcus lugdunensis few colonies. I am going to try to address this with his [employer] antibiotic that is done so well on the left having linezolid and this should cover the staph 2/1; the patient's wound on his left foot which was the original plantar foot wound thick skin and eschar around the edges even in the total contact cast but the wound surface does not look too bad The real problem is on how his right lateral foot at roughly the base of the fifth metatarsal. The wound is completely necrotic more worrisome than that there is swelling around the edges of this. We have been using silver alginate on  both wounds and Keystone on the right foot. Unfortunately I think he is going to require systemic antibiotics while we await cultures. He did not get the x-ray done that we ordered last week [lost the prescription 2/7; disappointingly in the area on the left foot which we are treating with a total contact cast is still not closed although it is much smaller. He continues to  have a lot of callus around the wound edge. -Right lateral foot culture I did last week was negative x-ray also negative for osteomyelitis. 2/15: TCC silver alginate on the left and silver alginate on the right lateral. No real improvement in either area 05/26/2021: T oday, the wounds are roughly the same size as at his previous visit, post-debridement. He continues to endorse fairly substantial drainage, particularly on the right. He has been in a total contact cast on the left. There is still some callus surrounding this lesion. On the right, the periwound skin is quite macerated, along with surrounding callus. The center of the right-sided wound also has some dark, densely adherent material, which is very difficult to remove. 06/02/2021: Today, both wounds are slightly smaller. He has been using zinc oxide ointment around the right ulcer and the degree of maceration has improved Terre[place], [name] ([id number]) [phone] Page 16 of 21 markedly. There continues to be an area of nonviable tissue in the center of the right sided ulcer. The left-sided wound, which has been in the total contact cast. Appears clean and the degree of callus around it is less than previously. 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 He had to take the stairs and both wounds have increased in size. The left foot, which has been in a total contact cast, has developed a tunnel tracking to the lateral aspect of his foot. The nonviable tissue in the center of the  right-sided ulcer remains recalcitrant to debridement. There is significant undermining surrounding the entirety of the left sided wound. 06/16/2021: The elevator at school has been fixed and the patient has been able to avoid putting as much weight on his wounds over the past week. We converted the left foot wound into a single lesion today, but despite this, the wound is actually smaller. The base is healthy with limited periwound callus. On the right, the central necrotic area is still present. He continues to be quite macerated around the right sided wound, despite applying barrier cream. This does, however, have the benefit of softening the callus to make it more easily removable. 06/23/2021: Today, the left wound is smaller. The lateral aspect that had opened up previously is now closed. The wound base has a healthy bed of granulation tissue and minimal slough. Unfortunately, on the right, the wound is larger and continues to be fairly macerated. He has also reopened the wound at his right ankle. He thinks this is due to the gait he has adopted secondary to his total contact cast and boot. 06/30/2021: T oday, both wounds are a little bit larger. The lateral aspect on the left has remained closed. He continues to have significant periwound maceration. The culture that I took from the right sided wound grew a population of bacteria that is not covered by his current [facility] antibiotic. The center of the right- sided wound continues to appear necrotic with nonviable fat. It probes deeper today, but does not reach bone. 07/07/2021: The periwound maceration is a little bit less today. The right lateral foot wound has some areas that appear more viable and the necrotic center also looks a little bit better. The wound on the dorsal surface of his right foot near the ankle is contracting and the surface appears healthy. The left plantar wound surface looks healthy, but there is some new undermining on the  medial portion. He did get his new Keystone antibiotic and began applying that  to the right foot wound on Saturday. 07/14/2021: The intake nurse reported substantial drainage from his wounds, but the periwound skin actually looks better than is typical for him. The wound on the dorsal surface of his right foot near the ankle is smaller and just has a small open area underneath some dried eschar. The left plantar wound surface looks healthy and there has been no significant accumulation of callus. The right lateral foot wound looks quite a bit better, with the central portion, which typically appears necrotic, looking more viable albeit pale. 07/22/2021: His left foot is extremely macerated today. The wound is about the same size. The wound on the dorsal surface of his right foot near the ankle had closed, but he traumatized it removing the dressing and there is a tiny skin tear in that location. The right lateral foot wound is bigger, but the surface appears healthy. 07/30/2021: The wound on the dorsal surface of his right foot near the ankle is closed. The right lateral foot wound again is a little bit bigger due to some undermining. The periwound skin is in better condition, however. He has been applying zinc oxide. The wound surface is a little bit dry today. On the left, he does not have the substantial maceration that we frequently see. The wound itself is smaller and has a clean surface. 08/06/2021: Both wounds seem to have deteriorated over the past week. The right lateral foot wound has a dry surface but the periwound is boggy.. Overall wound dimensions are about the same. On the left, the wound is about the same size, but there is more undermining present underneath periwound callus. 08/13/2021: The right sided wound looks about the same, but on the left there has been substantial deterioration. The undermining continues to extend under periwound callus. Once this was removed, substantial extension  of the wound was present. There is no odor or purulent drainage but clearly the wounds have broken down. 08/20/2021: The wounds look about the same today. He has been out of his total contact cast and has just been changing the dressings using topical Keystone with PolyMem Ag, Kerlix and Ace bandages. The wound on the top of his right ankle has reopened but this is quite small. There was a little bit of purulent material that I expressed when examining this wound. 08/24/2021: After the aggressive debridement I performed at his last visit, the wounds actually look a little bit better today. They are smaller with the exception of the wound on the top of his right ankle which is a little bit bigger as some more skin pulled off when he was changing his dressing. We are using topical Keystone with PolyMem Ag Kerlix and Ace bandages. 09/02/2021: There has been really no change to any of his wounds. 09/16/2021: The patient was hospitalized last week with nausea, vomiting, and dehydration. He says that while he was in the hospital, his wounds were not really addressed properly. T oday, both plantar foot wounds are larger and the periwound skin is macerated. The wound on the dorsum of his right foot has a scab on the top. The right foot now has a crater where previously he had had nonviable fat. It looks as though this simply died and fell out. The periwound callus is wet. 09/24/2021: His wounds have deteriorated somewhat since his last visit. The wound on the dorsum of his right foot near his ankle is larger and has more nonviable tissue present. The crater in his right foot is  even deeper; I cannot quite palpate or probe to bone but I am sure it is close. The wound on his left plantar foot has an odd boggy area in the center that almost feels as though it has fluid within it. He has run out of his topical Keystone antibiotic. We are using silver alginate on his wounds. 09/29/2021: He has developed a new wound on  the dorsum of his left foot near his ankle. He says he thinks his wrapping is rubbing in that site. I would concur with this as the wound on his right ankle is larger. The left foot looks about the same. The right foot has the crater that was present last week. No significant slough accumulation, but his foot remains quite swollen and warm despite oral antibiotic therapy. 10/08/2021: All of his wounds look about the same as last week. He did not start his oral antibiotics that are prescribed until just a couple of days ago; his [employer] compounded antibiotics formula has been changed and he is awaiting delivery of the new recipe. His MRI that was scheduled for earlier this week was canceled as no prior authorization had been obtained; unfortunately the tech responsible sent an email to my old [place] email, which I no longer use nor have access to. 10/18/2021: The wounds on his bilateral dorsal feet near the ankles are both improved. They are smaller and have just some eschar and slough buildup. The left plantar wound has a fair amount of undermining, but the surface is clean. There is some periwound callus accumulation. On the right plantar foot, there is nonviable fat leading to a deep tunnel that tracks towards his dorsal medial foot. There is periwound callus and slough accumulation, as well. His right foot and leg remain swollen as compared to the left. 10/25/2021: The wounds on his bilateral dorsal feet and at the ankles have broken down somewhat. They are little bit larger than last week. The left plantar wound continues to undermine laterally but the surface is clean. The right plantar foot wound shows some decreased depth in the tunnel tracking towards his dorsal medial foot. He has not yet had the Doppler study that I ordered; it sounds like there is some confusion about the scheduling of the procedure. In addition, the MRI was denied and I have taken steps to appeal the denial. 11/24/2021:  Since our last visit, [name] was admitted to the hospital where an MRI suggested osteomyelitis. He was taken the operating room by podiatry. Bone biopsies were negative for osteomyelitis. They debrided his wounds and applied myriad matrix. He saw them last week and they removed his staples. He is here today to continue his wound healing process. T oday, both of the dorsal foot/ankle wounds are substantially smaller. There is just a little eschar overlying the left sided wound and some eschar and slough on the right. The right plantar foot ulcer has the healthiest surface of granulation tissue that I have seen to date. A portion of the myriad matrix failed to take and was hanging loose. It appears that myriad morcells were placed into the tunnel closest to the dorsal portion of his foot. These have sloughed off. The left plantar foot ulcer is about the same size, but has a much healthier surface than in the past. Both plantar ulcers have callus and slough accumulation. 12/07/2021: Left dorsal foot/ankle wound is closed. The right dorsal foot/ankle wound is nearly closed and just has a small open area with some eschar  and [place]li[place], [name] ([id number]) [id number] Page 17 of 21 slough. The right plantar foot wound has contracted quite a bit since our last visit. It has a healthy surface with just a little bit of slough accumulation and periwound callus buildup. The left plantar foot wound is about the same size but the surface appears healthy. There is a little slough and periwound callus on this side, as well. 12/15/2021: Both dorsal foot/ankle wounds are closed. The right plantar foot wound is substantially smaller than at our last visit. The tunneling that was present has nearly closed. There is just a little bit of slough buildup. The left plantar foot wound is also a little bit smaller today. The surface is the healthiest that I have ever seen it. Light slough and  periwound callus accumulation on this side. 12/22/2021: The dorsal ankle/foot wounds remain closed. The right plantar foot wound continues to contract. There is still a bit of depth at the lateral portion of the wound but the surface has a good granulated appearance. The wound on the left is about the same size, the central indented portion is still adherent. It also is clean with good granulation tissue present. The periwound skin and callus are a bit macerated, however. 12/29/2021: Both ankle wounds remain closed. The right plantar foot wound is less than half the size that it was last week. There is no depth any longer and the surface has just a little bit of slough and periwound callus. On the left, the wound is not much smaller, but the surface is healthier with good granulation tissue. There is also a little slough and periwound callus buildup. 01/05/2022: The right plantar foot wound continues to contract and is once again about half the size as it was last week. There is just a little bit of surface slough and periwound callus. On the left, the depth of the wound has decreased and the diameter has started to contract. It is also very clean with just a little slough and biofilm present. 01/12/2022: The right plantar foot wound is smaller again today. There is just some periwound callus and slough accumulation. On the left, there is a fair amount of undermining and the surface is a little boggy, but no other significant change. 01/19/2022: The right plantar wound continues to contract. There is minimal periwound callus accumulation and just a light layer of slough on the surface. On the left, the surface looks better, but there is fairly significant undermining near the 11:00 portion of the wound, aiming towards his great toe. The skin overlying this area is very healthy and has a good fat layer present. No malodor or purulent drainage. 01/26/2022: The right sided wound continues to contract and  has just a light layer of slough on the surface. Minimal periwound callus. On the left, he still has substantial undermining and the tissue surface is not as robust as I would like to see. No malodor or purulent drainage, but he did say he had a lot of serous drainage over the weekend. 02/02/2022: The right foot wound is about a quarter of the size that it was at his last visit. There is just a little slough on the surface and some periwound callus. On the left, the undermining persists and the tissue is still more pale, but he does not seem to have had as much drainage over the last weekend. We are waiting on a wound VAC. 02/09/2022: His right foot has healed. He received the wound VAC, but did not  bring it to clinic with him today. The lateral aspect of the left foot wound has come in a little, but he still has substantial undermining on the medial and distal portion of the wound. Still with macerated periwound callus. 02/16/2022: The wound VAC was applied last Friday. T oday, there has been tremendous improvement in the surface of the wound bed. The undermined portion of the wound has come in a bit. There is still some overhanging dead skin. Overall the wound looks much better. 02/23/2022: No significant change in the overall wound dimensions, but the wound surface continues to improve and appears healthier and more robust. There is some periwound callus accumulation and overhanging skin. No concern for infection. 03/02/2022: Although the measurements taken in clinic today were unchanged, the visual appearance of the wound looks like it is smaller and the undermining/tunneling is filling with flesh. There is less periwound tissue maceration than usual. No malodor or purulent drainage. No concern for infection. 03/09/2022: The undermining has come in by couple of millimeters. The periwound is a little bit more macerated this week. No concern for infection. 12/13; substantial wound on the left plantar  foot. We are using silver collagen and a wound VAC. 03/23/2022: The undermining continues to contract. The wound surface is a little bit dry. 03/30/2022: The undermining has essentially resolved. There is still some depth at the center of the wound. The wound surface has good beefy tissue and the moisture balance is better. 04/06/2022: The wound dimensions are about the same, except that the depth is beginning to fill in. He has had more drainage this week, but there is no obvious concern for infection. 04/13/2022: He has built up a little bit of callus around the edges of the wound, creating some undermining. Otherwise the wound looks fairly unchanged. 04/20/2022: The wound bed tissue looks very healthy and robust. Light biofilm on the surface. There is some built up wet callus around the wound edges, but no undermining. 04/27/2022: The wound surface continues to look very healthy. Although the wound dimensions measured the same, it looks smaller to me. No real callus accumulation this week. Minimal slough on the surface. 05/04/2022: The wound measured smaller and shallower this week. We are still awaiting insurance approval for Apligraf. 05/11/2022: The wound is about the same in terms of depth but measured a little bit narrower today. He has built up a rim of moist callus around the edges. He has been approved for Apligraf and we will apply that today. 05/18/2022: The wound measured a little bit smaller again today. No significant callus buildup this week. 05/25/2022: The wound was measured about the same size today, but the multiple nooks and crannies in the depths of the wound seem to be filling in. 06/01/2022: The wound dimensions are smaller today, with the exception of the depth. There is a little bit of moist callus around the edges. 06/08/2022: The wound continues to contract aside from the depth. Once again, there is moist callus around the edges. The odor is less profound this week. 3/13; patient  presents for follow-up. He has had 5 applications of Apligraf and is currently not approved for more by insurance. He has also had a wound VAC in place. He has no issues or complaints today. 06/22/2022: The depth of the wound has come in somewhat. There is some undermining created by skin encroaching over the top of the wound. The wound continues to have a pungent odor. 06/29/2022: The wound dimensions are smaller, with the  exception of the depth. There is some undermining created by overlying skin and callus. There is still some odor, but it is not as strong as it was on prior occasions. The culture that I took last week returned with a polymicrobial population, Pseudomonas dominant. He is currently taking levofloxacin and a new [facility] prescription should be delivered later this week. Patient History Barcelo, [name] ([id number]) [id number] Page 18 of 21 Information obtained from Patient. Family History No family history of Cancer, Diabetes, Hereditary Spherocytosis, Hypertension, Kidney Disease, Lung Disease, Seizures, Stroke, Thyroid Problems, Tuberculosis. Social History Never smoker, Marital Status - Single, Alcohol Use - Rarely, Drug Use - No History, Caffeine Use - Never. Medical History Eyes Denies history of Cataracts, Glaucoma, Optic Neuritis Ear/Nose/Mouth/Throat Denies history of Chronic sinus problems/congestion, Middle ear problems Hematologic/Lymphatic Denies history of Anemia, Hemophilia, Human Immunodeficiency Virus, Lymphedema, Sickle Cell Disease Respiratory Denies history of Aspiration, Asthma, Chronic Obstructive Pulmonary Disease (COPD), Pneumothorax, Sleep Apnea, Tuberculosis Cardiovascular Denies history of Angina, Arrhythmia, Congestive Heart Failure, Coronary Artery Disease, Deep Vein Thrombosis, Hypertension, Hypotension, Myocardial Infarction, Peripheral Arterial Disease, Peripheral Venous Disease, Phlebitis,  Vasculitis Gastrointestinal Denies history of Cirrhosis , Colitis, Crohnoos, Hepatitis A, Hepatitis B, Hepatitis C Endocrine Patient has history of Type II Diabetes Denies history of Type I Diabetes Immunological Denies history of Lupus Erythematosus, Raynaudoos, Scleroderma Integumentary (Skin) Denies history of History of Burn Musculoskeletal Denies history of Gout, Rheumatoid Arthritis, Osteoarthritis, Osteomyelitis Neurologic Denies history of Dementia, Neuropathy, Quadriplegia, Paraplegia, Seizure Disorder Oncologic Denies history of Received Chemotherapy, Received Radiation Psychiatric Denies history of Anorexia/bulimia, Confinement Anxiety Hospitalization/Surgery History - 11/1-11/06/2018- sepsis foot infection. - 11/4-11/5 02 sats low respiratory distress. Objective Constitutional no acute distress. Vitals Time Taken: 8:20 AM, Height: 77 in, Weight: 280 lbs, BMI: 33.2, Temperature: 97.9 F, Pulse: 92 bpm, Respiratory Rate: 18 breaths/min, Blood Pressure: 139/88 mmHg, Capillary Blood Glucose: 120 mg/dl. Respiratory Normal work of breathing on room air. General Notes: 06/29/2022: The wound dimensions are smaller, with the exception of the depth. There is some undermining created by overlying skin and callus. There is still some odor, but it is not as strong as it was on prior occasions. Integumentary (Hair, Skin) Wound #3 status is Open. Original cause of wound was Trauma. The date acquired was: 10/02/2019. The wound has been in treatment 139 weeks. The wound is located on the [place]. The wound measures 1.5cm length x 2.5cm width x 2cm depth; 2.945cm^2 area and 5.89cm^3 volume. There is Fat Layer (Subcutaneous Tissue) exposed. There is no tunneling noted, however, there is undermining starting at 4:00 and ending at 2:00 with a maximum distance of 1.1cm. There is a medium amount of serosanguineous drainage noted. The wound margin is distinct with the outline  attached to the wound base. There is large (67-100%) red granulation within the wound bed. There is a small (1-33%) amount of necrotic tissue within the wound bed including Adherent Slough. The periwound skin appearance had no abnormalities noted for color. The periwound skin appearance exhibited: Callus, Maceration. The periwound skin appearance did not exhibit: Dry/Scaly. Periwound temperature was noted as No Abnormality. 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 There was a Selective/Open Wound Skin/Epidermis Debridement  with a total area of 6 sq cm performed by [name], MD. With the following instrument(s): Curette to remove Non-Viable tissue/material. Material removed includes Callus, Skin: Epidermis, and Biofilm. No specimens were taken. A time out was conducted at 08:40, prior to the start of the procedure. A Minimum amount of bleeding was controlled with Pressure. The procedure was tolerated well with a pain level of 0 throughout and a pain level of 0 following the procedure. Post Debridement Measurements: 2.3cm length x 2.8cm width x 2cm depth; 10.116cm^3 volume. Character of Wound/Ulcer Post Debridement is improved. Severity of Tissue Post Debridement is: Fat layer exposed. Post procedure Diagnosis Wound #3: Same as Pre-Procedure General Notes: Scribed for Dr. [name] by Baruch Gouty, RN. Plan Follow-up Appointments: Return Appointment in 1 week. - Dr. [name] - Room 1 with [facility] Wednesday 4/3 @ 08:15 am Anesthetic: (In clinic) Topical Lidocaine 4% applied to wound  bed Bathing/ Shower/ Hygiene: May shower and wash wound with soap and water. Negative Presssure Wound Therapy: Wound #3 Left,Lateral,Plantar Foot: Wound Vac to wound continuously at 149mm/hg pressure Black Foam Edema Control - Lymphedema / SCD / Other: Avoid standing for long periods of time. Exercise regularly Compression stocking or Garment 20-30 mm/Hg pressure to: - to both legs daily Off-Loading: Open toe surgical shoe to: - Both feet Additional Orders / Instructions: Follow Nutritious Diet Non Wound Condition: Apply the following to affected area as directed: - continue to apply foam donut or cushion to healed right plantar foot WOUND #3: - Foot Wound Laterality: Plantar, Left, Lateral Cleanser: Soap and Water 3 x Per Week/30 Days Discharge Instructions: May shower and wash wound with dial antibacterial soap and water prior to dressing change. Cleanser: Vashe 5.8 (oz) 3 x Per Week/30 Days Discharge Instructions: Cleanse the wound with Vashe prior to applying a clean dressing, let sit on wound for 5-10 minutesusing gauze sponges, not tissue or cotton balls. Peri-Wound Care: Skin Prep (Generic) 3 x Per Week/30 Days Discharge Instructions: Use skin prep as directed Topical: keystone antibiotic compound 3 x Per Week/30 Days Discharge Instructions: apply to base of wound Prim Dressing: Promogran Prisma Matrix, 4.34 (sq in) (silver collagen) (Dispense As Written) 3 x Per Week/30 Days ary Discharge Instructions: Moisten collagen with saline or hydrogel Prim Dressing: VAC 3 x Per Week/30 Days ary Secondary Dressing: Zetuvit Absorbent Pad, 4x4 (in/in) (Dispense As Written) 3 x Per Week/30 Days Secured With: Elastic Bandage 4 inch (ACE bandage) (Generic) 3 x Per Week/30 Days Discharge Instructions: Secure with ACE bandage as directed. Secured With: 64M Medipore Public affairs consultant Surgical T 2x10 (in/yd) (Generic) 3 x Per Week/30 Days ape Discharge Instructions: Secure with tape as  directed. Com pression Wrap: Kerlix Roll 4.5x3.1 (in/yd) (Generic) 3 x Per Week/30 Days Discharge Instructions: Apply Kerlix and Coban compression as directed. 06/29/2022: The wound dimensions are smaller, with the exception of the depth. There is some undermining created by overlying skin and callus. There is still some odor, but it is not as strong as it was on prior occasions. I used a curette to debride away the skin creating the undermining. I also debrided biofilm off of the wound surface. We will continue Prisma silver collagen on the wound bed with negative pressure wound therapy. He will continue his oral levofloxacin. When his [employer] topical antibiotic compound arrives, he will begin applying it to the wound bed underneath the collagen. Follow-up in 1 week. Electronic Signature(s) Signed: 06/29/2022 9:08:23 AM By: [name] MD FACS Entered By: [name]  on 06/29/2022 09:08:23 -------------------------------------------------------------------------------- HxROS Details Patient Name: Date of Service: [name] 06/29/2022 8:15 A M Medical Record Number: [id number] Patient Account Number: [id number] [name], [name] ([id number]) [id number] Page 20 of 21 Date of Birth/Sex: Treating RN: [birth year] (37 y.o. [name] Primary Care Provider: Other Clinician: [name] Referring Provider: Treating Provider/Extender: [name] in Treatment: 3 Information Obtained From Patient Eyes Medical History: Negative for: Cataracts; Glaucoma; Optic Neuritis Ear/Nose/Mouth/Throat Medical History: Negative for: Chronic sinus problems/congestion; Middle ear problems Hematologic/Lymphatic Medical History: Negative for: Anemia; Hemophilia; Human Immunodeficiency Virus; Lymphedema; Sickle Cell Disease Respiratory Medical History: Negative for: Aspiration; Asthma; Chronic Obstructive Pulmonary Disease  (COPD); Pneumothorax; Sleep Apnea; Tuberculosis Cardiovascular Medical History: Negative for: Angina; Arrhythmia; Congestive Heart Failure; Coronary Artery Disease; Deep Vein Thrombosis; Hypertension; Hypotension; Myocardial Infarction; Peripheral Arterial Disease; Peripheral Venous Disease; Phlebitis; Vasculitis Gastrointestinal Medical History: Negative for: Cirrhosis ; Colitis; Crohns; Hepatitis A; Hepatitis B; Hepatitis C Endocrine Medical History: Positive for: Type II Diabetes Negative for: Type I Diabetes Time with diabetes: 8 Treated with: Insulin Blood sugar tested every day: No Immunological Medical History: Negative for: Lupus Erythematosus; Raynauds; Scleroderma Integumentary (Skin) Medical History: Negative for: History of Burn Musculoskeletal Medical History: Negative for: Gout; Rheumatoid Arthritis; Osteoarthritis; Osteomyelitis Neurologic Medical History: Negative for: Dementia; Neuropathy; Quadriplegia; Paraplegia; Seizure Disorder Oncologic Medical History: Negative for: Received Chemotherapy; Received Radiation Psychiatric Medical History: Negative for: [name] Anxiety [name], [name] ([id number]) [id number] Page 21 of 21 Immunizations Pneumococcal Vaccine: Received Pneumococcal Vaccination: No Implantable Devices None Hospitalization / Surgery History Type of Hospitalization/Surgery 11/1-11/06/2018- sepsis foot infection 11/4-11/5 02 sats low respiratory distress Family and Social History Cancer: No; Diabetes: No; Hereditary Spherocytosis: No; Hypertension: No; Kidney Disease: No; Lung Disease: No; Seizures: No; Stroke: No; Thyroid Problems: No; Tuberculosis: No; Never smoker; Marital Status - Single; Alcohol Use: Rarely; Drug Use: No History; Caffeine Use: Never; Financial Concerns: No; Food, Clothing or Shelter Needs: No; Support System Lacking: No; Transportation Concerns: No Electronic  Signature(s) Signed: 06/29/2022 9:16:27 AM By: [name] MD FACS Signed: 06/29/2022 5:17:40 PM By: Baruch Gouty RN, BSN Entered By: [name] on 06/29/2022 09:06:33 -------------------------------------------------------------------------------- SuperBill Details Patient Name: Date of Service: [name], [name] 06/29/2022 Medical Record Number: [id number] Patient Account Number: [id number] Date of Birth/Sex: Treating RN: 1985/07/28 (37 y.o. [name] Primary Care Provider: [name] Other Clinician: Referring Provider: Treating Provider/Extender: [name] in Treatment: 139 Diagnosis Coding ICD-10 Codes Code Description [phone] Non-pressure chronic ulcer of other part of left foot with other specified severity E11.621 Type 2 diabetes mellitus with foot ulcer Facility Procedures : CPT4 Code: [id number] Description: [id number] - DEBRIDE WOUND 1ST 20 SQ CM OR < ICD-10 Diagnosis Description L97.528 Non-pressure chronic ulcer of other part of left foot with other specified severi Modifier: ty Quantity: 1 : CPT4 Code: [id number] Description: [id number] - WOUND VAC-50 SQ CM OR LESS Modifier: Quantity: 1 Physician Procedures : CPT4 Code Description Modifier [id number] - WC PHYS LEVEL 4 - EST PT 25 ICD-10 Diagnosis Description L97.528 Non-pressure chronic ulcer of other part of left foot with other specified severity E11.621 Type 2 diabetes mellitus with foot ulcer Quantity: 1 : [id number] - WC PHYS DEBR WO ANESTH 20 SQ CM ICD-10 Diagnosis Description L97.528 Non-pressure chronic ulcer of other part of left foot with other specified severity Quantity: 1 Electronic Signature(s) Signed: 06/29/2022 9:08:42 AM By: [name] MD FACS Entered By: [name] on 06/29/2022 09:08:41

## 2022-06-30 NOTE — Progress Notes (Signed)
[name], Max ([id number]) [id number] Page 1 of 7 Visit Report for 06/29/2022 Arrival Information Details Patient Name: Date of Service: [name] 06/29/2022 8:15 A M Medical Record Number: [id number] Patient Account Number: [id number] Date of Birth/Sex: Treating RN: Jul [birth year], [name] (37 y.o. [name], [name] Primary Care Katalea Ucci: [name] Other Clinician: Referring Amany Rando: Treating Demarri Elie/Extender: [name] in Treatment: 23 Visit Information History Since Last Visit Added or deleted any medications: Yes Patient Arrived: Ambulatory Any new allergies or adverse reactions: No Arrival Time: 08:16 Had a fall or experienced change in No Accompanied By: self activities of daily living that may affect Transfer Assistance: None risk of falls: Patient Identification Verified: Yes Signs or symptoms of abuse/neglect since last visito No Secondary Verification Process Completed: Yes Hospitalized since last visit: No Patient Requires Transmission-Based Precautions: No Implantable device outside of the clinic excluding No Patient Has Alerts: No cellular tissue based products placed in the center since last visit: Has Dressing in Place as Prescribed: Yes Has Compression in Place as Prescribed: Yes Pain Present Now: No Electronic Signature(s) Signed: 06/29/2022 5:[name]:40 PM By: Baruch Gouty RN, BSN Entered By: Baruch Gouty on 06/29/2022 08:20:28 -------------------------------------------------------------------------------- Encounter Discharge Information Details Patient Name: Date of Service: [name], [name] 06/29/2022 8:15 A M Medical Record Number: [id number] Patient Account Number: [id number] Date of Birth/Sex: Treating RN: [name] (37 y.o. [name] Primary Care Melainie Krinsky: [name] Other Clinician: Referring Amaiya Scruton: Treating Ayline Dingus/Extender: [name] in  Treatment: 139 Encounter Discharge Information Items Post Procedure Vitals Discharge Condition: Stable Temperature (F): 97.9 Ambulatory Status: Ambulatory Pulse (bpm): 92 Discharge Destination: Home Respiratory Rate (breaths/min): 18 Transportation: Private Auto Blood Pressure (mmHg): 139/88 Accompanied By: self Schedule Follow-up Appointment: Yes Clinical Summary of Care: Patient Declined Electronic Signature(s) Signed: 06/29/2022 5:[name]:40 PM By: Baruch Gouty RN, BSN Entered By: Baruch Gouty on 06/29/2022 09:10:41 -------------------------------------------------------------------------------- Lower Extremity Assessment Details Patient Name: Date of Service: [name], [name] 06/29/2022 8:15 A M Medical Record Number: [id number] Patient Account Number: [id number] Date of Birth/Sex: Treating RN: [name], [name] (37 y.o. [name] Primary Care Otila Starn: [name] Other Clinician: Referring Aerionna Moravek: Treating Lorette Peterkin/Extender: [name] in Treatment: 139 Edema Assessment A[Left: RMSTRONG, Max ([id number] Patrice [id number] Page 2 of 7] Assessed: [Left: No] [Right: No] Edema: [Left: Ye] [Right: s] Calf Left: Right: Point of Measurement: 48 cm From Medial Instep 47 cm Ankle Left: Right: Point of Measurement: 11 cm From Medial Instep 30 cm Vascular Assessment Pulses: Dorsalis Pedis Palpable: [Left:Yes] Electronic Signature(s) Signed: 06/29/2022 5:[name]:40 PM By: Baruch Gouty RN, BSN Entered By: Baruch Gouty on 06/29/2022 08:27:14 -------------------------------------------------------------------------------- Multi Wound Chart Details Patient Name: Date of Service: [name], [name] 06/29/2022 8:15 A M Medical Record Number: [id number] Patient Account Number: [id number] Date of Birth/Sex: Treating RN: [name], [name] (37 y.o. [name] Primary Care Martena Emanuele: [name] Other  Clinician: Referring Devonda Pequignot: Treating Taten Merrow/Extender: [name] in Treatment: 139 Vital Signs Height(in): 77 Capillary Blood Glucose(mg/dl): 120 Weight(lbs): 280 Pulse(bpm): 92 Body Mass Index(BMI): 33.2 Blood Pressure(mmHg): 139/88 Temperature(F): 97.9 Respiratory Rate(breaths/min): 18 [3:Photos:] [N/A:N/A] Left, Lateral, Plantar Foot N/A N/A Wound Location: Trauma N/A N/A Wounding Event: Diabetic Wound/Ulcer of the Lower N/A N/A Primary Etiology: Extremity Type II Diabetes N/A N/A Comorbid History: 10/02/2019 N/A N/A Date Acquired: 139 N/A N/A Weeks of Treatment: Open N/A N/A Wound Status: No N/A N/A Wound Recurrence: 1.5x2.5x2 N/A N/A Measurements L x W x [name]  2.945 N/A N/A A (cm) : rea 5.89 N/A N/A Volume (cm) : -78.60% N/A N/A % Reduction in A rea: -3469.70% N/A N/A % Reduction in Volume: 4 Starting Position 1 (o'clock): 2 Ending Position 1 (o'clock): 1.1 Maximum Distance 1 (cm): Yes N/A N/A Undermining: Grade 2 N/A N/A Classification: Medium N/A N/A Exudate A mount: Serosanguineous N/A N/A Exudate Type: red, brown N/A N/A Exudate Color: Distinct, outline attached N/A N/A Wound Margin: Large (67-100%) N/A N/A Granulation A mount: Choudhry, Max ([id number] Page 3 of 7 Red N/A N/A Granulation Quality: Small (1-33%) N/A N/A Necrotic Amount: Fat Layer (Subcutaneous Tissue): Yes N/A N/A Exposed Structures: Fascia: No Tendon: No Muscle: No Joint: No Bone: No Small (1-33%) N/A N/A Epithelialization: Debridement - Selective/Open Wound N/A N/A Debridement: Pre-procedure Verification/Time Out 08:40 N/A N/A Taken: Callus N/A N/A Tissue Debrided: Skin/Epidermis N/A N/A Level: 6 N/A N/A Debridement A (sq cm): rea Curette N/A N/A Instrument: Minimum N/A N/A Bleeding: Pressure N/A N/A Hemostasis A chieved: 0 N/A N/A Procedural Pain: 0 N/A N/A Post  Procedural Pain: Procedure was tolerated well N/A N/A Debridement Treatment Response: 2.3x2.8x2 N/A N/A Post Debridement Measurements L x W x [name] 10.116 N/A N/A Post Debridement Volume: (cm) Callus: Yes N/A N/A Periwound Skin Texture: Maceration: Yes N/A N/A Periwound Skin Moisture: Dry/Scaly: No No Abnormalities Noted N/A N/A Periwound Skin Color: No Abnormality N/A N/A Temperature: Debridement N/A N/A Procedures Performed: Negative Pressure Wound Therapy Maintenance (NPWT) Treatment Notes Electronic Signature(s) Signed: 06/29/2022 9:04:35 AM By: [name] MD FACS Signed: 06/29/2022 5:[name]:40 PM By: Baruch Gouty RN, BSN Entered By: [name] on 06/29/2022 09:04:35 -------------------------------------------------------------------------------- Multi-Disciplinary Care Plan Details Patient Name: Date of Service: [name], [name] 06/29/2022 8:15 A M Medical Record Number: [id number] Patient Account Number: [id number] Date of Birth/Sex: Treating RN: [name] (37 y.o. [name] Primary Care Kynedi Profitt: [name] Other Clinician: Referring Joanell Cressler: Treating Enedina Pair/Extender: [name] in Treatment: Byron reviewed with physician Active Inactive Nutrition Nursing Diagnoses: Imbalanced nutrition Potential for alteratiion in Nutrition/Potential for imbalanced nutrition Goals: Patient/caregiver agrees to and verbalizes understanding of need to use nutritional supplements and/or vitamins as prescribed Date Initiated: 7/[name]/2021 Date Inactivated: 04/06/2020 Target Resolution Date: 04/03/2020 Goal Status: Met Patient/caregiver will maintain therapeutic glucose control Date Initiated: 7/[name]/2021 Target Resolution Date: 07/27/2022 Goal Status: Active Interventions: Assess HgA1c results as ordered upon admission and as needed Assess patient nutrition upon admission and as needed per  policy Provide education on elevated blood sugars and impact on wound healing Provide education on nutrition Treatment Activities: Ream, Max ([id number]) [phone].pdf Page 4 of 7 Education provided on Nutrition : 02/02/2022 Notes: 8/16/[name]: Glucose control ongoing issue, target date extended. 10/25/[name]: Glucose management continues. Wound/Skin Impairment Nursing Diagnoses: Impaired tissue integrity Knowledge deficit related to ulceration/compromised skin integrity Goals: Patient/caregiver will verbalize understanding of skin care regimen Date Initiated: 7/[name]/2021 Target Resolution Date: 07/27/2022 Goal Status: Active Ulcer/skin breakdown will have a volume reduction of 30% by week 4 Date Initiated: 7/[name]/2021 Date Inactivated: 01/16/2020 Target Resolution Date: 01/10/2020 Unmet Reason: no change in Goal Status: Unmet measurements. Interventions: Assess patient/caregiver ability to obtain necessary supplies Assess patient/caregiver ability to perform ulcer/skin care regimen upon admission and as needed Assess ulceration(s) every visit Provide education on ulcer and skin care Notes: 8/16/[name]: Wound care regimen continues Electronic Signature(s) Signed: 06/29/2022 5:[name]:40 PM By: Baruch Gouty RN, BSN Entered By: Baruch Gouty on 06/29/2022 08:35:19 -------------------------------------------------------------------------------- Negative Pressure Wound Therapy Maintenance (NPWT) Details Patient Name:  Date of Service: [name] 06/29/2022 8:15 A M Medical Record Number: [id number] Patient Account Number: [id number] Date of Birth/Sex: Treating RN: [name] (37 y.o. [name] Primary Care Westly Hinnant: [name] Other Clinician: Referring Assia Meanor: Treating Hallis Meditz/Extender: [name] in Treatment: 139 NPWT Maintenance Performed for: Wound #3 Left, Lateral, Plantar Foot Performed By: Baruch Gouty,  RN Type: VAC System Coverage Size (sq cm): 3.75 Pressure Type: Constant Pressure Setting: 125 mmHG Drain Type: None Primary Contact: Other : silver collagen Sponge/Dressing Type: Foam- Black Date Initiated: 02/11/2022 Dressing Removed: Yes Quantity of Sponges/Gauze Removed: 1 Canister Changed: No Dressing Reapplied: Yes Quantity of Sponges/Gauze Inserted: 1 Respones T Treatment: o good Days On NPWT : 139 Post Procedure Diagnosis Same as Pre-procedure Electronic Signature(s) Signed: 06/29/2022 5:[name]:40 PM By: Baruch Gouty RN, BSN Entered By: Baruch Gouty on 06/29/2022 08:46:41 -------------------------------------------------------------------------------- Pain Assessment Details Patient Name: Date of Service: [name], [name] 06/29/2022 8:15 A Tinnie Gens, Max ([id number] Page 5 of 7 Medical Record Number: [id number] Patient Account Number: [id number] Date of Birth/Sex: Treating RN: [name] (37 y.o. [name] Primary Care Raniyah Curenton: [name] Other Clinician: Referring Darlene Brozowski: Treating Grisell Bissette/Extender: [name] in Treatment: 139 Active Problems Location of Pain Severity and Description of Pain Patient Has Paino No Site Locations Rate the pain. Current Pain Level: 0 Pain Management and Medication Current Pain Management: Electronic Signature(s) Signed: 06/29/2022 5:[name]:40 PM By: Baruch Gouty RN, BSN Entered By: Baruch Gouty on 06/29/2022 08:[name]:09 -------------------------------------------------------------------------------- Patient/Caregiver Education Details Patient Name: Date of Service: [name], [name] 3/27/2024andnbsp8:15 A M Medical Record Number: [id number] Patient Account Number: [id number] Date of Birth/Gender: Treating RN: [name], [name] (37 y.o. [name] Primary Care Physician: [name] Other Clinician: Referring Physician: Treating  Physician/Extender: [name] in Treatment: 139 Education Assessment Education Provided To: Patient Education Topics Provided Elevated Blood Sugar/ Impact on Healing: Methods: Explain/Verbal Responses: Reinforcements needed, State content correctly Offloading: Methods: Explain/Verbal Responses: Reinforcements needed, State content correctly Wound/Skin Impairment: Methods: Explain/Verbal Responses: Reinforcements needed, State content correctly Electronic Signature(s) Signed: 06/29/2022 5:[name]:40 PM By: Baruch Gouty RN, BSN Entered By: Baruch Gouty on 06/29/2022 08:35:57 [place], Max ([id number] Page 6 of 7 -------------------------------------------------------------------------------- Wound Assessment Details Patient Name: Date of Service: [name] 06/29/2022 8:15 A M Medical Record Number: [id number] Patient Account Number: [id number] Date of Birth/Sex: Treating RN: [name] (37 y.o. [name] Primary Care Keidrick Murty: [name] Other Clinician: Referring Carrell Rahmani: Treating Jerald Hennington/Extender: [name] in Treatment: 139 Wound Status Wound Number: 3 Primary Etiology: Diabetic Wound/Ulcer of the Lower Extremity Wound Location: Left, Lateral, Plantar Foot Wound Status: Open Wounding Event: Trauma Comorbid History: Type II Diabetes Date Acquired: 10/02/2019 Weeks Of Treatment: 139 Clustered Wound: No Photos Wound Measurements Length: (cm) 1.5 Width: (cm) 2.5 Depth: (cm) 2 Area: (cm) 2.945 Volume: (cm) 5.89 % Reduction in Area: -78.6% % Reduction in Volume: -3469.7% Epithelialization: Small (1-33%) Tunneling: No Undermining: Yes Starting Position (o'clock): 4 Ending Position (o'clock): 2 Maximum Distance: (cm) 1.1 Wound Description Classification: Grade 2 Wound Margin: Distinct, outline attached Exudate Amount: Medium Exudate Type:  Serosanguineous Exudate Color: red, brown Foul Odor After Cleansing: No Slough/Fibrino Yes Wound Bed Granulation Amount: Large (67-100%) Exposed Structure Granulation Quality: Red Fascia Exposed: No Necrotic Amount: Small (1-33%) Fat Layer (Subcutaneous Tissue) Exposed: Yes Necrotic Quality: Adherent Slough Tendon Exposed: No Muscle Exposed: No Joint Exposed: No Bone Exposed: No Periwound Skin Texture  Texture Color No Abnormalities Noted: No No Abnormalities Noted: Yes Callus: Yes Temperature / Pain Temperature: No Abnormality Moisture No Abnormalities Noted: No Dry / Scaly: No Maceration: Yes Treatment Notes Wound #3 (Foot) Wound Laterality: Plantar, Left, Lateral Vidaurri, Max ([id number] Page 7 of 7 Cleanser Soap and Water Discharge Instruction: May shower and wash wound with dial antibacterial soap and water prior to dressing change. Vashe 5.8 (oz) Discharge Instruction: Cleanse the wound with Vashe prior to applying a clean dressing, let sit on wound for 5-10 minutesusing gauze sponges, not tissue or cotton balls. Peri-Wound Care Skin Prep Discharge Instruction: Use skin prep as directed Topical keystone antibiotic compound Discharge Instruction: apply to base of wound Primary Dressing Promogran Prisma Matrix, 4.34 (sq in) (silver collagen) Discharge Instruction: Moisten collagen with saline or hydrogel VAC Secondary Dressing Zetuvit Absorbent Pad, 4x4 (in/in) Secured With Elastic Bandage 4 inch (ACE bandage) Discharge Instruction: Secure with ACE bandage as directed. 60M Medipore Soft Cloth Surgical T 2x10 (in/yd) ape Discharge Instruction: Secure with tape as directed. Compression Wrap Kerlix Roll 4.5x3.1 (in/yd) Discharge Instruction: Apply Kerlix and Coban compression as directed. Compression Stockings Add-Ons Electronic Signature(s) Signed: 06/29/2022 5:[name]:40 PM By: Baruch Gouty RN, BSN Entered By: Baruch Gouty on 06/29/2022 08:33:44 -------------------------------------------------------------------------------- Vitals Details Patient Name: Date of Service: [name], [name] 06/29/2022 8:15 A M Medical Record Number: [id number] Patient Account Number: [id number] Date of Birth/Sex: Treating RN: [name] (37 y.o. [name] Primary Care Antonae Zbikowski: [name] Other Clinician: Referring Raquon Milledge: Treating Rainee Sweatt/Extender: [name] in Treatment: 139 Vital Signs Time Taken: 08:20 Temperature (F): 97.9 Height (in): 77 Pulse (bpm): 92 Weight (lbs): 280 Respiratory Rate (breaths/min): 18 Body Mass Index (BMI): 33.2 Blood Pressure (mmHg): 139/88 Capillary Blood Glucose (mg/dl): 120 Reference Range: 80 - 120 mg / dl Electronic Signature(s) Signed: 06/29/2022 5:[name]:40 PM By: Baruch Gouty RN, BSN Entered By: Baruch Gouty on 06/29/2022 08:[name]:00

## 2022-07-06 ENCOUNTER — Encounter (HOSPITAL_BASED_OUTPATIENT_CLINIC_OR_DEPARTMENT_OTHER): Payer: BC Managed Care – PPO | Attending: General Surgery | Admitting: General Surgery

## 2022-07-06 DIAGNOSIS — L84 Corns and callosities: Secondary | ICD-10-CM | POA: Diagnosis not present

## 2022-07-06 DIAGNOSIS — E11621 Type 2 diabetes mellitus with foot ulcer: Secondary | ICD-10-CM | POA: Diagnosis present

## 2022-07-06 DIAGNOSIS — S80222A Blister (nonthermal), left knee, initial encounter: Secondary | ICD-10-CM | POA: Insufficient documentation

## 2022-07-06 DIAGNOSIS — L97528 Non-pressure chronic ulcer of other part of left foot with other specified severity: Secondary | ICD-10-CM | POA: Insufficient documentation

## 2022-07-06 DIAGNOSIS — I890 Lymphedema, not elsewhere classified: Secondary | ICD-10-CM | POA: Diagnosis not present

## 2022-07-06 DIAGNOSIS — L89892 Pressure ulcer of other site, stage 2: Secondary | ICD-10-CM | POA: Insufficient documentation

## 2022-07-06 DIAGNOSIS — M659 Synovitis and tenosynovitis, unspecified: Secondary | ICD-10-CM | POA: Insufficient documentation

## 2022-07-06 DIAGNOSIS — E1151 Type 2 diabetes mellitus with diabetic peripheral angiopathy without gangrene: Secondary | ICD-10-CM | POA: Insufficient documentation

## 2022-07-07 NOTE — Progress Notes (Signed)
[name], [name] ([id number]) [id number] Page 1 of 22 Visit Report for 07/06/2022 Chief Complaint Document Details Patient Name: Date of Service: [name] 07/06/2022 8:15 A M Medical Record Number: [id number] Patient Account Number: [id number] Date of Birth/Sex: Treating RN: June 19, 1985 (37 y.o. M) Primary Care Provider: [name] Other Clinician: Referring Provider: Treating Provider/Extender: [name] in Treatment: 140 Information Obtained from: Patient Chief Complaint 01/11/2019; patient is here for review of a rather substantial wound over the left fifth plantar metatarsal head extending into the lateral part of his foot 10/24/2019; patient returns to clinic with wounds on his bilateral feet with underlying osteomyelitis biopsy-proven Electronic Signature(s) Signed: 07/06/2022 8:55:14 AM By: [name] MD FACS Entered By: [name] on 07/06/2022 08:55:14 -------------------------------------------------------------------------------- Debridement Details Patient Name: Date of Service: [name], [name] 07/06/2022 8:15 A M Medical Record Number: [id number] Patient Account Number: [id number] Date of Birth/Sex: Treating RN: February 10, 1986 (37 y.o. [name] Primary Care Provider: [name] Other Clinician: Referring Provider: Treating Provider/Extender: [name] in Treatment: 140 Debridement Performed for Assessment: Wound #3 Left,Lateral,Plantar Foot Performed By: Physician [name], MD Debridement Type: Debridement Severity of Tissue Pre Debridement: Fat layer exposed Level of Consciousness (Pre-procedure): Awake and Alert Pre-procedure Verification/Time Out Yes - 08:50 Taken: Start Time: 08:50 T Area Debrided (L x W): otal 2.3 (cm) x 2.8 (cm) = 6.44 (cm) Tissue and other material debrided: Non-Viable, Callus, Skin: Epidermis Level:  Skin/Epidermis Debridement Description: Selective/Open Wound Instrument: Curette Bleeding: Minimum Hemostasis Achieved: Pressure Procedural Pain: 0 Post Procedural Pain: 0 Response to Treatment: Procedure was tolerated well Level of Consciousness (Post- Awake and Alert procedure): Post Debridement Measurements of Total Wound Length: (cm) 2.3 Width: (cm) 2.8 Depth: (cm) 1.5 Volume: (cm) 7.587 Character of Wound/Ulcer Post Debridement: Improved Severity of Tissue Post Debridement: Fat layer exposed Post Procedure Diagnosis Same as Pre-procedure Notes Scribed for Dr. [name] by Baruch Gouty, RN Electronic Signature(s) Signed: 07/06/2022 10:02:36 AM By: [name] MD FACS Signed: 07/06/2022 6:19:38 PM By: Baruch Gouty RN, BSN [place], [name] ([id number]) By: Baruch Gouty RN, BSN [phone].pdf Page 2 of 22 Signed: 07/06/2022 6:19:38 PM Entered By: Baruch Gouty on 07/06/2022 08:52:53 -------------------------------------------------------------------------------- HPI Details Patient Name: Date of Service: [name] 07/06/2022 8:15 A M Medical Record Number: [id number] Patient Account Number: [id number] Date of Birth/Sex: Treating RN: May 25, 1985 (37 y.o. 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 He comes in for review of a wound over the plantar fifth metatarsal head extending into the lateral part of the foot. He was followed for this previously by his podiatrist Dr. Cornelius Moras. As the patient tells his story he went to see podiatry first for a swelling he developed on the lateral part of his fifth metatarsal head in May. He states this was "open" by podiatry and the area closed.  He was followed up in June and it was again opened callus removed and it closed promptly. There were plans being made for surgery on the fifth metatarsal head in June however his blood sugar was apparently too high for anesthesia. Apparently the area was debrided and opened again in June and it is never closed since. Looking over the records from podiatry I am really not able to follow this. It  was clear when he was first seen it was before 5/14 at that point he already had a wound. By 5/17 the ulcer was resolved. I do not see anything about a procedure. On 5/28 noted to have pre-ulcerative moderate keratosis. X-ray noted 1/5 contracted toe and tailor's bunion and metatarsal deformity. On a visit date on 09/28/2018 the dorsal part of the left foot it healed and resolved. There was concern about swelling in his lower extremity he was sent to the ER.. As far as I can tell he was seen in the ER on 7/12 with an ulcer on his left foot. A DVT rule out of the left leg was negative. I do not think I have complete records from podiatry but I am not able to verify the procedures this patient states he had. He states after the last procedure the wound has never closed although I am not able to follow this in the records I have from podiatry. He has not had a recent x-ray The patient has been using Neosporin on the wound. He is wearing a Darco shoe. He is still very active up on his foot working and exercising. Past medical history; type 2 diabetes ketosis-prone, leg swelling with a negative DVT study in July. Non-smoker ABI in our clinic was 0.85 on the left 10/16; substantial wound on the plantar left fifth met head extending laterally almost to the dorsal fifth MTP. We have been using silver alginate we gave him a Darco forefoot off loader. An x-ray did not show evidence of osteomyelitis did note soft tissue emphysema which I think was due to gas tracking through an open wound. There is no doubt in my mind he  requires an MRI 10/23; MRI not booked until 3 November at the earliest this is largely due to his glucose sensor in the right arm. We have been using silver alginate. There has been an improvement 10/29; I am still not exactly sure when his MRI is booked for. He says it is the third but it is the 10th in epic. This definitely needs to be done. He is running a low-grade fever today but no other symptoms. No real improvement in the 1 02/26/2019 patient presents today for a follow-up visit here in our clinic he is last been seen in the clinic on October 29. Subsequently we were working on getting MRI to evaluate and see what exactly was going on and where we would need to go from the standpoint of whether or not he had osteomyelitis and again what treatments were going be required. Subsequently the patient ended up being admitted to the hospital on 02/07/2019 and was discharged on 02/14/2019. This is a somewhat interesting admission with a discharge diagnosis of pneumonia due to COVID-19 although he was positive for COVID-19 when tested at the urgent care but negative x2 when he was actually in the hospital. With that being said he did have acute respiratory failure with hypoxia and it was noted he also have a left foot ulceration with osteomyelitis. With that being said he did require oxygen for his pneumonia and I level 4 L. He was placed on antivirals and steroids for the COVID-19. He was also transferred to the [facility] at one point. Nonetheless he did subsequently discharged home and since being home has done much better in that regard. The CT angiogram did not show any pulmonary embolism. With regard to the osteomyelitis the patient was placed on vancomycin and Zosyn while in the hospital  but has been changed to Augmentin at discharge. It was also recommended that he follow- up with wound care and podiatry. Podiatry however wanted him to see [name] according to the patient prior to them doing  anything further. His hemoglobin A1c was 9.9 as noted in the hospital. Have an MRI of the left foot performed while in the hospital on 02/04/2019. This showed evidence of septic arthritis at the fifth MTP joint and osteomyelitis involving the fifth metatarsal head and proximal phalanx. There is an overlying plantar open wound noted an abscess tracking back along the lateral aspect of the fifth metatarsal shaft. There is otherwise diffuse cellulitis and mild fasciitis without findings of polymyositis. The patient did have recently pneumonia secondary to COVID-19 I looked in the chart through epic and it does appear that the patient may need to have an additional x-ray just to ensure everything is cleared and that he has no airspace disease prior to putting him into the [name]. 03/05/2019; patient was readmitted to the clinic last week. He was hospitalized twice for a viral upper respiratory tract infection from 11/1 through 11/4 and then 11/5 through 11/12 ultimately this turned out to be Covid pneumonitis. Although he was discharged on oxygen he is not using it. He says he feels fine. He has no exercise limitation no cough no sputum. His O2 sat in our clinic today was 100% on room air. He did manage to have his MRI which showed septic arthritis at the fifth MTP joint and osteomyelitis involving the fifth metatarsal head and proximal phalanx. He received Vanco and Zosyn in the hospital and then was discharged on 2 weeks of Augmentin. I do not see any relevant cultures. He was supposed to follow-up with infectious disease but I do not see that he has an appointment. 12/8; patient saw Dr. Novella Olive of infectious disease last week. He felt that he had had adequate antibiotic therapy. He did not go to follow-up with Dr. Amalia Hailey of podiatry and I have again talked to him about the pros and cons of this. He does not want to consider a ray amputation of this time. He is aware of the risks of recurrence, migration  etc. He started HBO today and tolerated this well. He can complete the Augmentin that I gave him last week. I have looked over the lab work that Dr. Chana Bode ordered his C-reactive protein was 3.3 and his sedimentation rate was 17. The C-reactive protein is never really been measurably that high in this patient 12/15; not much change in the wound today however he has undermining along the lateral part of the foot again more extensively than last week. He has some rims of epithelialization. We have been using silver alginate. He is undergoing hyperbarics but did not dive today 12/18; in for his obligatory first total contact cast change. Unfortunately there was pus coming from the undermining area around his fifth metatarsal head. This was cultured but will preclude reapplication of a cast. He is seen in conjunction with HBO 12/24; patient had staph lugdunensis in the wound in the undermining area laterally last time. We put him on doxycycline which should have covered this. The wound looks better today. I am going to give him another week of doxycycline before reattempting the total contact cast 12/31; the patient is completing antibiotics. Hemorrhagic debris in the distal part of the wound with some undermining distally. He also had hyper granulation. Extensive debridement with a #5 curette. The infected area that was on the  lateral part of the fifth met head is closed over. I do not think he needs any more antibiotics. Patient was seen prior to HBO. Preparations for a total contact cast were made in the cast will be placed post hyperbarics 04/11/19; once again the patient arrives today without complaint. He had been in a cast all week noted that he had heavy drainage this week. This resulted in large raised areas of macerated tissue around the wound 1/14; wound bed looks better slightly smaller. Hydrofera Blue has been changing himself. He had a heavy drainage last week which caused a lot of  maceration [place], [name] ([id number]) [phone].pdf Page 3 of 22 around the wound so I took him out of a total contact cast he says the drainage is actually better this week He is seen today in conjunction with HBO 1/21; returns to clinic. He was up in [place] for a day or 2 attending a funeral. He comes back in with the wound larger and with a large area of exposed bone. He had osteomyelitis and septic arthritis of the fifth left metatarsal head while he was in hospital. He received IV antibiotics in the hospital for a prolonged period of time then 3 weeks of Augmentin. Subsequently I gave him 2 weeks of doxycycline for more superficial wound infection. When I saw this last week the wound was smaller the surface of the wound looks satisfactory. 1/28; patient missed hyperbarics today. Bone biopsy I did last time showed Enterococcus faecalis and Staphylococcus lugdunensis . He has a wide area of exposed bone. We are going to use silver alginate as of today. I had another ethical discussion with the patient. This would be recurrent osteomyelitis he is already received IV antibiotics. In this situation I think the likelihood of healing this is low. Therefore I have recommended a ray amputation and with the patient's agreement I have referred him to Dr. Doran Durand. The other issue is that his compliance with hyperbarics has been minimal because of his work schedule and given his underlying decision I am going to stop this today READMISSION 10/24/2019 MRI 09/29/2019 left foot IMPRESSION: 1. Apparent skin ulceration inferior and lateral to the 5th metatarsal base with underlying heterogeneous T2 signal and enhancement in the subcutaneous fat. Small peripherally enhancing fluid collections along the plantar and lateral aspects of the 5th metatarsal base suspicious for abscesses. 2. Interval amputation through the mid 5th metatarsal with nonspecific low-level marrow edema and  enhancement. Given the proximity to the adjacent soft tissue inflammatory changes, osteomyelitis cannot be excluded. 3. The additional bones appear unremarkable. MRI 09/29/2019 right foot IMPRESSION: 1. Soft tissue ulceration lateral to the 5th MTP joint. There is low-level T2 hyperintensity within the 4th and 5th metatarsal heads and adjacent proximal phalanges without abnormal T1 signal or cortical destruction. These findings are nonspecific and could be seen with early marrow edema, hyperemia or early osteomyelitis. No evidence of septic joint. 2. Mild tenosynovitis and synovial enhancement associated with the extensor digitorum tendons at the level of the midfoot. 3. Diffuse low-level muscular T2 hyperintensity and enhancement, most consistent with diabetic myopathy. LEFT FOOT BONE Methicillin resistant staphylococcus aureus Staphylococcus lugdunensis MIC MIC CIPROFLOXACIN >=8 RESISTANT Resistant <=0.5 SENSI... Sensitive CLINDAMYCIN <=0.25 SENS... Sensitive >=8 RESISTANT Resistant ERYTHROMYCIN >=8 RESISTANT Resistant >=8 RESISTANT Resistant GENTAMICIN <=0.5 SENSI... Sensitive <=0.5 SENSI... Sensitive Inducible Clindamycin NEGATIVE Sensitive NEGATIVE Sensitive OXACILLIN >=4 RESISTANT Resistant 2 SENSITIVE Sensitive RIFAMPIN <=0.5 SENSI... Sensitive <=0.5 SENSI... Sensitive TETRACYCLINE <=1 SENSITIVE Sensitive <=1 SENSITIVE Sensitive TRIMETH/SULFA <=10 SENSIT  Sensitive <=10 SENSIT Sensitive ... Marland Kitchen.. VANCOMYCIN 1 SENSITIVE Sensitive <=0.5 SENSI... Sensitive Right foot bone . Component 3 wk ago Specimen Description BONE Special Requests RIGHT 4 METATARSAL SAMPLE B Gram Stain NO WBC SEEN NO ORGANISMS SEEN Culture RARE METHICILLIN RESISTANT STAPHYLOCOCCUS AUREUS NO ANAEROBES ISOLATED Performed at [facility], [place] [address], [place],  [address] Report Status 10/08/2019 FINAL Organism ID, Bacteria METHICILLIN RESISTANT STAPHYLOCOCCUS AUREUS Resulting Agency [facility] Susceptibility Methicillin resistant staphylococcus aureus MIC CIPROFLOXACIN >=8 RESISTANT Resistant CLINDAMYCIN <=0.25 SENS... Sensitive ERYTHROMYCIN >=8 RESISTANT Resistant GENTAMICIN <=0.5 SENSI... Sensitive Inducible Clindamycin NEGATIVE Sensitive OXACILLIN >=4 RESISTANT Resistant RIFAMPIN <=0.5 SENSI... Sensitive TETRACYCLINE <=1 SENSITIVE Sensitive TRIMETH/SULFA <=10 SENSIT Sensitive ... VANCOMYCIN 1 SENSITIVE Sensitive This is a patient we had in clinic earlier this year with a wound over his left fifth metatarsal head. 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 Doran Durand on 05/16/2019. He seems to have developed problems with wounds on his bilateral feet in June 2021 although he may have had problems earlier than this. He was in an urgent care with a right foot ulcer on 09/26/2019 and given a course of doxycycline. This was apparently after having trouble getting into see orthopedics. He was seen by podiatry on 09/28/2019 noted to have bilateral lower extremity ulcers including the left lateral fifth metatarsal base and the right subfifth met head. It was noted that had purulent drainage at that time. He required hospitalization from 6/20 through 7/2. This was because of worsening right foot wounds. He underwent bilateral operative incision and drainage and bone biopsies bilaterally. Culture results are listed above. He has been referred back to clinic by Dr. Jacqualyn Posey of podiatry. He is also followed by Dr. Megan Salon who saw him yesterday. He was discharged from hospital on Zyvox Flagyl and Levaquin and yesterday changed  to doxycycline Flagyl and Levaquin. His inflammatory markers on 6/26 showed a sedimentation rate of 129 and a C-reactive protein of 5. This is improved to 14 and 1.3 respectively. This would indicate improvement. ABIs in our clinic today were 1.23 on the right and 1.20 on the left 11/01/2019 on evaluation today patient appears to be doing fairly well in regard to the wounds on his feet at this point. Fortunately there is no signs of active infection at this time. No fevers, chills, nausea, vomiting, or diarrhea. He currently is seeing infectious disease and still under their care at this point. Subsequently he also has both wounds which she has not been using collagen on as he did not receive that in his packaging he did not call us and let us know that. Apparently that just was missed on the order. Nonetheless we will get that straightened out today. 8/9-Patient returns for bilateral foot wounds, using Prisma with hydrogel moistened dressings, and the wounds appear stable. Patient using surgical shoes, avoiding much pressure or weightbearing as much as possible 8/16; patient has bilateral foot wounds. 1 on the right lateral foot proximally the other is on the left mid lateral foot. Both required debridement of callus and thick skin around the wounds. We have been using silver collagen 8/27; patient has bilateral lateral foot wounds. The area on the left substantially surrounded  by callus and dry skin. This was removed from the wound edge. The underlying wound is small. The area on the right measured somewhat smaller today. We've been using silver collagen the patient was on antibiotics for underlying osteomyelitis in the left foot. Unfortunately I did not update his antibiotics during today's visit. 9/10 I reviewed Dr. Hale Bogus last notes he felt he had completed antibiotics his inflammatory markers were reasonably well controlled. He has a small wound on the lateral left foot and a tiny area on  the right which is just above closed. He is using Hydrofera Blue with border foam he has bilateral surgical shoes 9/24; 2 week f/u. doing well. right foot is closed. left foot still undermined. 10/14; right foot remains closed at the fifth met head. The area over the base of the left fifth metatarsal has a small open area but considerable undermining towards the plantar foot. Thick callus skin around this suggests an adequate pressure relief. We have talked about this. He says he is going to go back into his cam boot. I suggested a total contact cast he did not seem enamored with this suggestion 10/26; left foot base of the fifth metatarsal. Same condition as last time. He has skin over the area with an open wound however the skin is not adherent. He went to see Dr. Earleen Newport who did an x-ray and culture of his foot I have not reviewed the x-ray but the patient was not told anything. He is on doxycycline 11/11; since the patient was last here he was in the emergency room on 10/30 he was concerned about swelling in the left foot. They did not do any cultures or x-rays. They changed his antibiotics to cephalexin. Previous culture showed group B strep. The cephalexin is appropriate as doxycycline has less than predictable coverage. Arrives in clinic today with swelling over this area under the wound. He also has a new wound on the right fifth metatarsal head 11/18; the patient has a difficult wound on the lateral aspect of the left fifth metatarsal head. The wound was almost ballotable last week I opened it slightly expecting to see purulence however there was just bleeding. I cultured this this was negative. X-ray unchanged. We are trying to get an MRI but I am not sure were going to be able to get this through his insurance. He also has an area on the right lateral fifth metatarsal head this looks healthier 12/3; the patient finally got our MRI. Surprisingly this did not show osteomyelitis. I did show the  soft tissue ulceration at the lateral plantar aspect of the fifth metatarsal base with a tiny residual 6 mm abscess overlying the superficial fascia I have tried to culture this area I have not been able to get this to grow anything. Nevertheless the protruding tissue looks aggravated. I suspect we should try to treat the underlying "abscess with broad-spectrum antibiotics. I am going to start him on Levaquin and Flagyl. He has much less edema in his legs and I am going to continue to wrap his legs and see him weekly 12/10. I started Levaquin and Flagyl on him last week. He just picked up the Flagyl apparently there was some delay. The worry is the wound on the left fifth metatarsal base which is substantial and worsening. His foot looks like he inverts at the ankle making this a weightbearing surface. Certainly no improvement in fact I think the measurements of this are somewhat worse. We have been using 12/17;  he apparently just got the Levaquin yesterday this is 2 weeks after the fact. He has completed the Flagyl. The area over the left fifth metatarsal base still has protruding granulation tissue although it does not look quite as bad as it did some weeks ago. He has severe bilateral lymphedema although we have not been treating him for wounds on his legs this is definitely going to require compression. There was so much edema in the left I did not wish to put him in a total contact cast today. I am going to increase his compression from 3-4 layer. The area on the right lateral fifth met head actually look quite good and superficial. 12/23; patient arrived with callus on the right fifth met head and the substantial hyper granulated callused wound on the base of his fifth metatarsal. He says he is completing his Levaquin in 2 days but I do not think that adds up with what I gave him but I will have to double check this. We are using Hydrofera Blue on both areas. My plan is to put the left leg in a  cast the week after New Year's 04/06/2020; patient's wounds about the same. Right lateral fifth metatarsal head and left lateral foot over the base of the fifth metatarsal. There is undermining on the left lateral foot which I removed before application of total contact cast continuing with Hydrofera Blue new. Patient tells me he was seen by endocrinology today lab work was done [Dr. Kerr]. Also wondering whether he was referred to cardiology. I went over some lab work from previously does not have chronic renal failure certainly not nephrotic range proteinuria he does have very poorly controlled diabetes but this is not his most updated lab work. Hemoglobin A1c has been over 11 1/10; the patient had a considerable amount of leakage towards mid part of his left foot with macerated skin however the wound surface looks better the area on the right lateral fifth met head is better as well. I am going to change the dressing on the left foot under the total contact cast to silver alginate, continue with Hydrofera Blue on the right. 1/20; patient was in the total contact cast for 10 days. Considerable amount of drainage although the skin around the wound does not look too bad on the left foot. The area on the right fifth metatarsal head is closed. Our nursing staff reports large amount of drainage out of the left lateral foot wound 1/25; continues with copious amounts of drainage described by our intake staff. PCR culture I did last week showed E. coli and Enterococcus faecalis and low quantities. Multiple resistance genes documented including extended spectrum beta lactamase, MRSA, MRSE, quinolone, tetracycline. The wound is not quite as good this week as it was 5 days ago but about the same size 2/3; continues with copious amounts of malodorous drainage per our intake nurse. The PCR culture I did 2 weeks ago showed E. coli and low quantities of Enterococcus. There were multiple resistance genes detected. I  put Neosporin on him last week although this does not seem to have helped. The wound is slightly deeper today. Offloading continues to be an issue here although with the amount of drainage she has a total contact cast is just not going to work 2/10; moderate amount of drainage. Patient reports he cannot get his stocking on over the dressing. I told him we have to do that the nurse gave him suggestions on how to make this work.  The wound is on the bottom and lateral part of his left foot. Is cultured predominantly grew low amounts of Enterococcus, E. coli and anaerobes. There were multiple resistance genes detected including extended spectrum beta lactamase, quinolone, tetracycline. I could not think of an easy oral combination to address this so for now I am going to do topical antibiotics provided by [facility] I think the main agents here are vancomycin and an aminoglycoside. We have to be able to give him access to the wounds to get the topical antibiotic on 2/17; moderate amount of drainage this is unchanged. He has his Keystone topical antibiotic against the deep tissue culture organisms. He has been using this and changing the dressing daily. Silver alginate on the wound surface. 2/24; using Keystone antibiotic with silver alginate on the top. He had too much drainage for a total contact cast at one point although I think that is improving and I think in the next week or 2 it might be possible to replace a total contact cast I did not do this today. In general the wound surface looks healthy however he continues to have thick rims of skin and subcutaneous tissue around the wide area of the circumference which I debrided 06/04/2020 upon evaluation today patient appears to be doing well in regard to his wound. I do feel like he is showing signs of improvement. There is little bit of callus and dead tissue around the edges of the wound as well as what appears to be a little bit of a sinus tract that is  off to the side laterally I would perform debridement to clear that away today. 3/17; left lateral foot. The wound looks about the same as I remember. Not much depth surface looks healthy. No evidence of infection 3/25; left lateral foot. Wound surface looks about the same. Separating epithelium from the circumference. There really is no evidence of infection here however not making progress by my view Moville, [name] ([id number]) [id number] Page 5 of 22 3/29; left lateral foot. Surface of the wound again looks reasonably healthy still thick skin and subcutaneous tissue around the wound margins. There is no evidence of infection. One of the concerns being brought up by the nurses has again the amount of drainage vis--vis continued use of a total contact cast 4/5; left lateral foot at roughly the base of the fifth metatarsal. Nice healthy looking granulated tissue with rims of epithelialization. The overall wound measurements are not any better but the tissue looks healthy. The only concern is the amount of drainage although he has no surrounding maceration with what we have been doing recently to absorb fluid and protect his skin. He also has lymphedema. 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 He states that the kerlix/coban under the TCC rolled down and could not get it back up. He has been using an offloading scooter and has somehow hurt his right foot using this device. This happened last week. He states that the side of his right foot developed a blister and opened. The top of his foot also has a few small open wounds he thinks is due to his socks rubbing in his shoes. He has not been using any dressings to the wound. He  denies purulent drainage, fever/chills or erythema to the wounds. 4/22; patient presents for 1 week follow-up. He developed new wounds to the right foot that were evaluated at last clinic visit. He continues to have a total contact cast to the left leg and he reports no issues. He has been using silver collagen to the right foot wounds with no issues. He denies purulent drainage, fever/chills or erythema to the right foot wounds. He has no complaints today 4/25; patient presents for 1 week follow-up. He has a total contact cast of the left leg and reports no issues. He has been using silver alginate to the right foot wound. He denies purulent drainage, fever/chills or erythema to the right foot wounds. 5/2 patient presents for 1 week follow-up. T contact cast on the left. The wound which is on the base of the plantar foot at the base of the fifth metatarsal otal actually looks quite good and dimensions continue to gradually contract. HOWEVER the area on the right lateral fifth metatarsal head is much larger than what I remember from 2 weeks ago. Once more is he has significant levels of hypergranulation. Noteworthy that he had this same hyper granulated response on his wound on the left foot at one point in time. So much so that he I thought there was an underlying fluid collection. Based on this I think this just needs debridement. 5/9; the wound on the left actually continues to be gradually smaller with a healthy surface. Slight amount of drainage and maceration of the skin around but not too bad. However he has a large wound over the right fifth metatarsal head very much in the same configuration as his left foot wound was initially. I used silver nitrate to address the hyper granulated tissue no mechanical debridement 5/16; area on the left foot did not look as healthy this week deeper thick surrounding macerated skin and subcutaneous tissue. The area on the right foot fifth met head was about  the same The area on the right ankle that we identified last week is completely broken down into an open wound presumably a stocking rubbing issue 5/23; patient has been using a total contact cast to the left side. He has been using silver alginate underneath. He has also been using silver alginate to the right foot wounds. He has no complaints today. He denies any signs of infection. 5/31; the left-sided wound looks some better measure smaller surface granulation looks better. We have been using silver alginate under the total contact cast The large area on his right fifth met head and right dorsal foot look about the same still using silver alginate 6/6; neither side is good as I was hoping although the surface area dimensions are better. A lot of maceration on his left and right foot around the wound edge. Area on the dorsal right foot looks better. He says he was traveling. I am not sure what does the amount of maceration around the plantar wounds may be drainage issues 6/13; in general the wound surfaces look quite good on both sides. Macerated skin and raised edges around the wound required debridement although in general especially on the left the surface area seems improved. The area on the right dorsal ankle is about the same I thought this would not be such a problem to close 6/20; not much change in either wound although the one on the right looks a little better. Both wounds have thick macerated edges to the skin requiring debridements. We have  been using silver alginate. The area on the dorsal right ankle is still open I thought this would be closed. 6/28; patient comes in today with a marked deterioration in the right foot wound fifth met head. Wide area of exposed bone this is a drastic change from last time. The area on the left there we have been casting is stagnant. We have been using silver alginate in both wound areas. 7/5; bone culture I did for PCR last time was positive for  Pseudomonas, group B strep, Enterococcus and Staph aureus. There was no suggestion of methicillin resistance or ampicillin resistant genes. This was resistant to tetracycline however He comes into the clinic today with the area over his right plantar fifth metatarsal head which had been doing so well 2 weeks ago completely necrotic feeling bone. I do not know that this is going to be salvageable. The left foot wound is certainly no smaller but it has a better surface and is superficial. 7/8; patient called in this morning to say that his total contact cast was rubbing against his foot. He states he is doing fine overall. He denies signs of infection. 7/12; continued deterioration in the wound over the right fifth metatarsal head crumbling bone. This is not going to be salvageable. The patient agrees and wants to be referred to Dr. Doran Durand which we will attempt to arrange as soon as possible. I am going to continue him on antibiotics as long as that takes so I will renew those today. The area on the left foot which is the base of the fifth metatarsal continues to look somewhat better. Healthy looking tissue no depth no debridement is necessary here. 7/20; the patient was kindly seen by Dr. Doran Durand of orthopedics on 10/19/2020. 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 Towards this end we have taken him out of the total contact cast on the left we will put him in a wrap with Hydrofera Blue. As I understand things surgery is planned for 7/21 7/27; patient had his surgery last Thursday. 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 Apparently everything went well we did not still disturb that today The area on the left foot actually looks quite good. He has been much less mobile which probably explains this he did not seem to do well in the total contact cast secondary to drainage and maceration I think. We have been  using Hydrofera Blue 11/09/2020 upon evaluation today patient appears to be doing well with regard to his plantar foot ulcer on the left foot. Fortunately there is no evidence of active infection at this time. No fevers, chills, nausea, vomiting, or diarrhea. Overall I think that he is actually doing extremely well. Nonetheless I do believe that he is staying off of this more following the surgery in his right foot that is the reason the left is doing so great. 8/16; left plantar foot wound. This looks smaller than the last time I saw this he is using Hydrofera Blue. The surgical wound on the right foot is being followed by Dr. Doran Durand we did not look at this today. He has surgical shoes on both feet 8/23; left plantar foot wound not as good this week. Surrounding macerated skin and subcutaneous tissue everything looks moist and wet. I do not think he is offloading this adequately. He is using a surgical shoe Apparently the right foot surgical  wound is not open although I did not check his foot 8/31; left plantar foot lateral aspect. Much improved this week. He has no maceration. Some improvement in the surface area of the wound but most impressively the depth is come in we are using silver alginate. The patient is a Product/process development scientist. He is asked that we write him a letter so he can go back to work. I have also tried to see if we can write something that will allow him to limit the amount of time that he is on his foot at work. 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 Hopefully the latter can be avoided 9/6; I believe the patient missed an appointment last week. He arrives in today with a wound looking roughly the same certainly no better. Undermining laterally Baehr, [name] ([id number]) [id number] Page 6 of 22 and also inferiorly. We used molecuLight today in training with the patient's  permission.. We are using silver alginate 9/21 wound is measuring bigger this week although this may have to do with the aggressive circumferential debridement last week in response to the blush fluorescence on the MolecuLight. Culture I did last week showed significant MSSA and E. coli. I put him on Augmentin but he has not started it yet. We are also going to send this for compounded antibiotics at [facility]. There is no evidence of systemic infection 9/29; silver alginate. His Keystone arrived. He is completing Augmentin in 2 days. Offloading in a cam boot. Moderate drainage per our intake staff 10/5; using silver alginate. He has been using his [place]. He has completed his Augmentin. Per our intake nurse still a lot of drainage, far too much to consider a total contact cast. Wound measures about the same. He had the same undermining area that I defined last week from a roughly 11-3. I remove this today 10/12; using silver alginate he is using the [place]. He comes in for a nurse visit hence we are applying [employer] twice a week. Measuring slightly better today and less notable drainage. Extensive debridement of the wound edge last time 10/18; using topical Keystone and silver alginate and a soft cast. Wound measurements about the same. Drainage was through his soft cast. We are changing this twice a week Tuesdays and Friday 10/25; comes in with moderate drainage. Still using Keystone silver alginate and a soft cast. Wound dimensions completely the same.He has a lot of edema in the left leg he has lymphedema. Asking for [name] to consider wrapping him as he cannot get his stocking on over the soft cast 11/2; comes in with moderate to large drainage slightly smaller in terms of width we have been using [place]. His wound looks satisfactory but not much improvement 11/4; patient presents today for obligatory cast change. Has no issues or complaints today. He denies signs of infection. 11/9; patient  traveled this weekend to [place], was on the cast quite a bit. Staining of the cast with black material from his walking boot. Drainage was not quite as bad as we feared. Using silver alginate and Keystone 11/16; we do not have size for cast therefore we have been putting a soft cast on him since the change on Friday. Still a significant amount of drainage necessitating changing twice a week. We have been using the Keystone at cast changes either hard or soft as well as silver alginate Comes in the clinic with things actually  looking fairly good improvement in width. He says his offloading is about the same 02/24/2021 upon evaluation today patient actually comes back in and is doing excellent in regard to his foot ulcer this is significantly smaller even compared to the last visit. The soft cast seems to have done extremely well for him which is great news. I do not see any signs of infection minimal debridement will be needed today. 11/30; left lateral foot much improved half a centimeter improvement in surface area. No evidence of infection. He seems to be doing better with the soft cast in the TCC therefore we will continue with this. He comes back in later in the week for a change with the nurses. This is due to drainage 12/6; no improvement in dimensions. Under illumination some debris on the surface we have been using silver alginate, soft cast. If there is anything optimistic here he seems to have have less drainage 12/13. Dimensions are improved both length and width and slightly in depth. Appears to be quite healthy today. Raised edges of this thick skin and callus around the edges however. He is in a soft cast were bringing him back once for a change on Friday. Drainage is better 12/20. Dimensions are improved. He still has raised edges of thick skin and callus around the edges. We are using a soft cast 12/28; comes in today with thick callus around the wound. Using silver under alginate under a  soft cast. I do not think there is much improvement in any measurement 2023 04/06/2021; patient was put in a total contact cast. Unfortunately not much change in surface area 1/10; not much different still thick callus and skin around the edge in spite of the total contact cast. This was just debrided last week we have been using the [facility] compounded antibiotic and silver alginate under a total contact cast 1/18 the patient's wound on the left side is doing nicely. smaller HOWEVER he comes in today with a wound on the right foot laterally. blister most likely serosangquenous drainage 1/24; the patient continues to do well in terms of the plantar left foot which is continued to contract using silver alginate under the total contact cast HOWEVER the right lateral foot is bigger with denuded skin around the edges. I used pickups and a #15 scalpel to remove this this looks like the remanence of a large blister. Cannot rule out infection. Culture in this area I did last week showed Staphylococcus lugdunensis few colonies. I am going to try to address this with his [employer] antibiotic that is done so well on the left having linezolid and this should cover the staph 2/1; the patient's wound on his left foot which was the original plantar foot wound thick skin and eschar around the edges even in the total contact cast but the wound surface does not look too bad The real problem is on how his right lateral foot at roughly the base of the fifth metatarsal. The wound is completely necrotic more worrisome than that there is swelling around the edges of this. We have been using silver alginate on both wounds and Keystone on the right foot. Unfortunately I think he is going to require systemic antibiotics while we await cultures. He did not get the x-ray done that we ordered last week [lost the prescription 2/7; disappointingly in the area on the left foot which we are treating with a total contact cast is still  not closed although it is much smaller. He continues  to have a lot of callus around the wound edge. -Right lateral foot culture I did last week was negative x-ray also negative for osteomyelitis. 2/15: TCC silver alginate on the left and silver alginate on the right lateral. No real improvement in either area 05/26/2021: T oday, the wounds are roughly the same size as at his previous visit, post-debridement. He continues to endorse fairly substantial drainage, particularly on the right. He has been in a total contact cast on the left. There is still some callus surrounding this lesion. On the right, the periwound skin is quite macerated, along with surrounding callus. The center of the right-sided wound also has some dark, densely adherent material, which is very difficult to remove. 06/02/2021: Today, both wounds are slightly smaller. He has been using zinc oxide ointment around the right ulcer and the degree of maceration has improved markedly. There continues to be an area of nonviable tissue in the center of the right sided ulcer. The left-sided wound, which has been in the total contact cast. Appears clean and the degree of callus around it is less than previously. 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 He had to take the stairs and both wounds have increased in size. The left foot, which has been in a total contact cast, has developed a tunnel tracking to the lateral aspect of his foot. The nonviable tissue in the center of the right-sided ulcer remains recalcitrant to debridement. There is significant undermining surrounding the entirety of the left sided wound. 06/16/2021: The elevator at school has been fixed and the patient has been able to avoid putting as much weight on his wounds over the past week. We converted the left foot wound into a single lesion today, but despite this, the wound is actually smaller. The base is  healthy with limited periwound callus. On the right, the central necrotic area is still present. He continues to be quite macerated around the right sided wound, despite applying barrier cream. This does, however, have the benefit of softening the callus to make it more easily removable. 06/23/2021: Today, the left wound is smaller. The lateral aspect that had opened up previously is now closed. The wound base has a healthy bed of granulation tissue and minimal slough. Unfortunately, on the right, the wound is larger and continues to be fairly macerated. He has also reopened the wound at his right ankle. He thinks this is due to the gait he has adopted secondary to his total contact cast and boot. 06/30/2021: T oday, both wounds are a little bit larger. The lateral aspect on the left has remained closed. He continues to have significant periwound maceration. The culture that I took from the right sided wound grew a population of bacteria that is not covered by his current [facility] antibiotic. The center of the right- sided wound continues to appear necrotic with nonviable fat. It probes deeper today, but does not reach bone. Weinheimer, [name] ([id number]) [id number] Page 7 of 22 07/07/2021: The periwound maceration is a little bit less today. The right lateral foot wound has some areas that appear more viable and the necrotic center also looks a little bit better. The wound on the dorsal surface of his right foot near the ankle is contracting and the surface appears healthy. The left plantar wound surface looks healthy, but there is some new undermining on the medial portion. He did get his new Keystone antibiotic and began applying that  to the right foot wound on Saturday. 07/14/2021: The intake nurse reported substantial drainage from his wounds, but the periwound skin actually looks better than is typical for him. The wound on the dorsal surface of his right foot near the ankle  is smaller and just has a small open area underneath some dried eschar. The left plantar wound surface looks healthy and there has been no significant accumulation of callus. The right lateral foot wound looks quite a bit better, with the central portion, which typically appears necrotic, looking more viable albeit pale. 07/22/2021: His left foot is extremely macerated today. The wound is about the same size. The wound on the dorsal surface of his right foot near the ankle had closed, but he traumatized it removing the dressing and there is a tiny skin tear in that location. The right lateral foot wound is bigger, but the surface appears healthy. 07/30/2021: The wound on the dorsal surface of his right foot near the ankle is closed. The right lateral foot wound again is a little bit bigger due to some undermining. The periwound skin is in better condition, however. He has been applying zinc oxide. The wound surface is a little bit dry today. On the left, he does not have the substantial maceration that we frequently see. The wound itself is smaller and has a clean surface. 08/06/2021: Both wounds seem to have deteriorated over the past week. The right lateral foot wound has a dry surface but the periwound is boggy.. Overall wound dimensions are about the same. On the left, the wound is about the same size, but there is more undermining present underneath periwound callus. 08/13/2021: The right sided wound looks about the same, but on the left there has been substantial deterioration. The undermining continues to extend under periwound callus. Once this was removed, substantial extension of the wound was present. There is no odor or purulent drainage but clearly the wounds have broken down. 08/20/2021: The wounds look about the same today. He has been out of his total contact cast and has just been changing the dressings using topical Keystone with PolyMem Ag, Kerlix and Ace bandages. The wound on the top of  his right ankle has reopened but this is quite small. There was a little bit of purulent material that I expressed when examining this wound. 08/24/2021: After the aggressive debridement I performed at his last visit, the wounds actually look a little bit better today. They are smaller with the exception of the wound on the top of his right ankle which is a little bit bigger as some more skin pulled off when he was changing his dressing. We are using topical Keystone with PolyMem Ag Kerlix and Ace bandages. 09/02/2021: There has been really no change to any of his wounds. 09/16/2021: The patient was hospitalized last week with nausea, vomiting, and dehydration. He says that while he was in the hospital, his wounds were not really addressed properly. T oday, both plantar foot wounds are larger and the periwound skin is macerated. The wound on the dorsum of his right foot has a scab on the top. The right foot now has a crater where previously he had had nonviable fat. It looks as though this simply died and fell out. The periwound callus is wet. 09/24/2021: His wounds have deteriorated somewhat since his last visit. The wound on the dorsum of his right foot near his ankle is larger and has more nonviable tissue present. The crater in his right foot  is even deeper; I cannot quite palpate or probe to bone but I am sure it is close. The wound on his left plantar foot has an odd boggy area in the center that almost feels as though it has fluid within it. He has run out of his topical Keystone antibiotic. We are using silver alginate on his wounds. 09/29/2021: He has developed a new wound on the dorsum of his left foot near his ankle. He says he thinks his wrapping is rubbing in that site. I would concur with this as the wound on his right ankle is larger. The left foot looks about the same. The right foot has the crater that was present last week. No significant slough accumulation, but his foot remains quite  swollen and warm despite oral antibiotic therapy. 10/08/2021: All of his wounds look about the same as last week. He did not start his oral antibiotics that are prescribed until just a couple of days ago; his [employer] compounded antibiotics formula has been changed and he is awaiting delivery of the new recipe. His MRI that was scheduled for earlier this week was canceled as no prior authorization had been obtained; unfortunately the tech responsible sent an email to my old [place] email, which I no longer use nor have access to. 10/18/2021: The wounds on his bilateral dorsal feet near the ankles are both improved. They are smaller and have just some eschar and slough buildup. The left plantar wound has a fair amount of undermining, but the surface is clean. There is some periwound callus accumulation. On the right plantar foot, there is nonviable fat leading to a deep tunnel that tracks towards his dorsal medial foot. There is periwound callus and slough accumulation, as well. His right foot and leg remain swollen as compared to the left. 10/25/2021: The wounds on his bilateral dorsal feet and at the ankles have broken down somewhat. They are little bit larger than last week. The left plantar wound continues to undermine laterally but the surface is clean. The right plantar foot wound shows some decreased depth in the tunnel tracking towards his dorsal medial foot. He has not yet had the Doppler study that I ordered; it sounds like there is some confusion about the scheduling of the procedure. In addition, the MRI was denied and I have taken steps to appeal the denial. 11/24/2021: Since our last visit, Mr. Thibodeaux was admitted to the hospital where an MRI suggested osteomyelitis. He was taken the operating room by podiatry. Bone biopsies were negative for osteomyelitis. They debrided his wounds and applied myriad matrix. He saw them last week and they removed his staples. He is here today to  continue his wound healing process. T oday, both of the dorsal foot/ankle wounds are substantially smaller. There is just a little eschar overlying the left sided wound and some eschar and slough on the right. The right plantar foot ulcer has the healthiest surface of granulation tissue that I have seen to date. A portion of the myriad matrix failed to take and was hanging loose. It appears that myriad morcells were placed into the tunnel closest to the dorsal portion of his foot. These have sloughed off. The left plantar foot ulcer is about the same size, but has a much healthier surface than in the past. Both plantar ulcers have callus and slough accumulation. 12/07/2021: Left dorsal foot/ankle wound is closed. The right dorsal foot/ankle wound is nearly closed and just has a small open area with some  eschar and slough. The right plantar foot wound has contracted quite a bit since our last visit. It has a healthy surface with just a little bit of slough accumulation and periwound callus buildup. The left plantar foot wound is about the same size but the surface appears healthy. There is a little slough and periwound callus on this side, as well. 12/15/2021: Both dorsal foot/ankle wounds are closed. The right plantar foot wound is substantially smaller than at our last visit. The tunneling that was present has nearly closed. There is just a little bit of slough buildup. The left plantar foot wound is also a little bit smaller today. The surface is the healthiest that I have ever seen it. Light slough and periwound callus accumulation on this side. 12/22/2021: The dorsal ankle/foot wounds remain closed. The right plantar foot wound continues to contract. There is still a bit of depth at the lateral portion of the wound but the surface has a good granulated appearance. The wound on the left is about the same size, the central indented portion is still adherent. It also is clean with good granulation tissue  present. The periwound skin and callus are a bit macerated, however. 12/29/2021: Both ankle wounds remain closed. The right plantar foot wound is less than half the size that it was last week. There is no depth any longer and the surface has just a little bit of slough and periwound callus. On the left, the wound is not much smaller, but the surface is healthier with good granulation tissue. There is also a little slough and periwound callus buildup. 01/05/2022: The right plantar foot wound continues to contract and is once again about half the size as it was last week. There is just a little bit of surface slough and periwound callus. On the left, the depth of the wound has decreased and the diameter has started to contract. It is also very clean with just a little slough and biofilm present. Mainer, [name] ([id number]) [id number] Page 8 of 22 01/12/2022: The right plantar foot wound is smaller again today. There is just some periwound callus and slough accumulation. On the left, there is a fair amount of undermining and the surface is a little boggy, but no other significant change. 01/19/2022: The right plantar wound continues to contract. There is minimal periwound callus accumulation and just a light layer of slough on the surface. On the left, the surface looks better, but there is fairly significant undermining near the 11:00 portion of the wound, aiming towards his great toe. The skin overlying this area is very healthy and has a good fat layer present. No malodor or purulent drainage. 01/26/2022: The right sided wound continues to contract and has just a light layer of slough on the surface. Minimal periwound callus. On the left, he still has substantial undermining and the tissue surface is not as robust as I would like to see. No malodor or purulent drainage, but he did say he had a lot of serous drainage over the weekend. 02/02/2022: The right foot wound is  about a quarter of the size that it was at his last visit. There is just a little slough on the surface and some periwound callus. On the left, the undermining persists and the tissue is still more pale, but he does not seem to have had as much drainage over the last weekend. We are waiting on a wound VAC. 02/09/2022: His right foot has healed. He received the wound VAC, but did  not bring it to clinic with him today. The lateral aspect of the left foot wound has come in a little, but he still has substantial undermining on the medial and distal portion of the wound. Still with macerated periwound callus. 02/16/2022: The wound VAC was applied last Friday. T oday, there has been tremendous improvement in the surface of the wound bed. The undermined portion of the wound has come in a bit. There is still some overhanging dead skin. Overall the wound looks much better. 02/23/2022: No significant change in the overall wound dimensions, but the wound surface continues to improve and appears healthier and more robust. There is some periwound callus accumulation and overhanging skin. No concern for infection. 03/02/2022: Although the measurements taken in clinic today were unchanged, the visual appearance of the wound looks like it is smaller and the undermining/tunneling is filling with flesh. There is less periwound tissue maceration than usual. No malodor or purulent drainage. No concern for infection. 03/09/2022: The undermining has come in by couple of millimeters. The periwound is a little bit more macerated this week. No concern for infection. 12/13; substantial wound on the left plantar foot. We are using silver collagen and a wound VAC. 03/23/2022: The undermining continues to contract. The wound surface is a little bit dry. 03/30/2022: The undermining has essentially resolved. There is still some depth at the center of the wound. The wound surface has good beefy tissue and the moisture balance is  better. 04/06/2022: The wound dimensions are about the same, except that the depth is beginning to fill in. He has had more drainage this week, but there is no obvious concern for infection. 04/13/2022: He has built up a little bit of callus around the edges of the wound, creating some undermining. Otherwise the wound looks fairly unchanged. 04/20/2022: The wound bed tissue looks very healthy and robust. Light biofilm on the surface. There is some built up wet callus around the wound edges, but no undermining. 04/27/2022: The wound surface continues to look very healthy. Although the wound dimensions measured the same, it looks smaller to me. No real callus accumulation this week. Minimal slough on the surface. 05/04/2022: The wound measured smaller and shallower this week. We are still awaiting insurance approval for Apligraf. 05/11/2022: The wound is about the same in terms of depth but measured a little bit narrower today. He has built up a rim of moist callus around the edges. He has been approved for Apligraf and we will apply that today. 05/18/2022: The wound measured a little bit smaller again today. No significant callus buildup this week. 05/25/2022: The wound was measured about the same size today, but the multiple nooks and crannies in the depths of the wound seem to be filling in. 06/01/2022: The wound dimensions are smaller today, with the exception of the depth. There is a little bit of moist callus around the edges. 06/08/2022: The wound continues to contract aside from the depth. Once again, there is moist callus around the edges. The odor is less profound this week. 3/13; patient presents for follow-up. He has had 5 applications of Apligraf and is currently not approved for more by insurance. He has also had a wound VAC in place. He has no issues or complaints today. 06/22/2022: The depth of the wound has come in somewhat. There is some undermining created by skin encroaching over the top of the  wound. The wound continues to have a pungent odor. 06/29/2022: The wound dimensions are smaller, with  the exception of the depth. There is some undermining created by overlying skin and callus. There is still some odor, but it is not as strong as it was on prior occasions. The culture that I took last week returned with a polymicrobial population, Pseudomonas dominant. He is currently taking levofloxacin and a new Keystone prescription should be delivered later this week. 07/06/2022: The depth of the wound has come in by about half a centimeter. He was up on his feet a bit more over the weekend and he says that he has been sliding in his shoe; the result is that he has developed a bit more undermining. The odor from his wound is gone. Electronic Signature(s) Signed: 07/06/2022 8:57:50 AM By: [name] MD FACS Entered By: [name] on 07/06/2022 08:57:50 -------------------------------------------------------------------------------- Physical Exam Details Patient Name: Date of Service: [name], [name] 07/06/2022 8:15 A M Medical Record Number: [id number] Patient Account Number: [id number] Date of Birth/Sex: Treating RN: [birth year] (37 y.o. M) Primary Care Provider: [name] Other Clinician: Referring Provider: Treating Provider/Extender: Wilfrid Lund [place], [name] ([id number]) [id number] Page 9 of 22 Weeks in Treatment: 140 Constitutional . . . . no acute distress. Respiratory Normal work of breathing on room air. Notes 07/06/2022: The depth of the wound has come in by about half a centimeter. He was up on his feet a bit more over the weekend and he says that he has been sliding in his shoe; the result is that he has developed a bit more undermining. The odor from his wound is gone. Electronic Signature(s) Signed: 07/06/2022 8:58:33 AM By: [name] MD FACS Entered By: [name] on 07/06/2022  08:58:32 -------------------------------------------------------------------------------- Physician Orders Details Patient Name: Date of Service: [name], [name] 07/06/2022 8:15 A M Medical Record Number: [id number] Patient Account Number: [id number] Date of Birth/Sex: Treating RN: [birth year] (37 y.o. 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 - Dr. [name] - Room 1 with [name] Return A Friday 4/12 @ 2:45 pm Anesthetic (In clinic) Topical Lidocaine 4% applied to wound bed Bathing/ Shower/ Hygiene May shower and wash wound with soap and water. Negative Presssure Wound Therapy Wound #3 Left,Lateral,Plantar Foot Wound Vac to wound continuously at 173mm/hg pressure Black Foam Edema Control - Lymphedema / SCD / Other Bilateral Lower Extremities Avoid standing for long periods of time. Exercise regularly Compression stocking or Garment 20-30 mm/Hg pressure to: - to both legs daily Off-Loading Open toe surgical shoe to: - Both feet Additional Orders / Instructions Follow Nutritious Diet Non Wound Condition pply the following to affected area as directed: - continue to apply foam donut or cushion to healed right plantar foot A Wound Treatment Wound #3 - Foot Wound Laterality: Plantar, Left, Lateral Cleanser: Soap and Water 3 x Per Week/30 Days Discharge Instructions: May shower and wash wound with dial antibacterial soap and water prior to dressing change. Cleanser: Vashe 5.8 (oz) 3 x Per Week/30 Days Discharge Instructions: Cleanse the wound with Vashe prior to applying a clean dressing, let  sit on wound for 5-10 minutesusing gauze sponges, not tissue or cotton balls. Natarajan, [name] ([id number]) [id number] Page 10 of 22 Peri-Wound Care: Skin Prep (Generic) 3  x Per Week/30 Days Discharge Instructions: Use skin prep as directed Topical: keystone antibiotic compound 3 x Per Week/30 Days Discharge Instructions: apply to base of wound Prim Dressing: Promogran Prisma Matrix, 4.34 (sq in) (silver collagen) (Dispense As Written) 3 x Per Week/30 Days ary Discharge Instructions: Moisten collagen with saline or hydrogel Prim Dressing: VAC ary 3 x Per Week/30 Days Secondary Dressing: Zetuvit Absorbent Pad, 4x4 (in/in) (Dispense As Written) 3 x Per Week/30 Days Secured With: Elastic Bandage 4 inch (ACE bandage) (Generic) 3 x Per Week/30 Days Discharge Instructions: Secure with ACE bandage as directed. Secured With: 18M Medipore Public affairs consultant Surgical T 2x10 (in/yd) (Generic) 3 x Per Week/30 Days ape Discharge Instructions: Secure with tape as directed. Compression Wrap: Kerlix Roll 4.5x3.1 (in/yd) (Generic) 3 x Per Week/30 Days Discharge Instructions: Apply Kerlix and Coban compression as directed. Electronic Signature(s) Signed: 07/06/2022 10:02:36 AM By: [name] MD FACS Entered By: [name] on 07/06/2022 08:58:48 -------------------------------------------------------------------------------- Problem List Details Patient Name: Date of Service: [name], [name] 07/06/2022 8:15 A M Medical Record Number: [id number] Patient Account Number: [id number] Date of Birth/Sex: Treating RN: 11/26/1985 (37 y.o. [name] Primary Care Provider: [name] Other Clinician: Referring Provider: Treating Provider/Extender: [name] in Treatment: 140 Active Problems ICD-10 Encounter Code Description Active Date MDM Diagnosis L97.528 Non-pressure chronic ulcer of other part of left foot with other specified  10/24/2019 No Yes severity E11.621 Type 2 diabetes mellitus with foot ulcer 10/24/2019 No Yes Inactive Problems ICD-10 Code Description Active Date Inactive Date L97.518 Non-pressure chronic ulcer of other part of right foot with other specified severity 07/14/2020 07/14/2020 L97.518 Non-pressure chronic ulcer of other part of right foot with other specified severity 10/24/2019 10/24/2019 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, right ankle and foot 10/24/2019 10/24/2019 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 08/10/2020 08/10/2020 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, left ankle and foot 10/24/2019 10/24/2019 Harm, [name] ([id number]) [id number] Page 11 of 22 L97.322 Non-pressure chronic ulcer of left ankle with fat layer exposed 09/29/2021 09/29/2021 B95.62 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 10/24/2019 10/24/2019 classified elsewhere Resolved Problems Electronic Signature(s) Signed: 07/06/2022 8:54:13 AM By: [name] MD FACS Entered By: [name] on 07/06/2022 08:54:13 -------------------------------------------------------------------------------- Progress Note Details Patient Name: Date of Service: [name], [name] 07/06/2022 8:15 A M Medical Record Number: [id number] Patient Account Number: [id number] Date of Birth/Sex: Treating RN: Aug 14, 1985 (37 y.o. 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 He comes in for review of a wound over the plantar fifth metatarsal head extending into the lateral part of the foot. He was followed for this previously by his podiatrist Dr. Cornelius Moras. As the patient tells his story he went to see podiatry first for a swelling he developed on the lateral part of his fifth metatarsal head in May. He states this was "open" by podiatry and the area closed. He was followed up in June and it was again opened callus removed and  it closed promptly. There were plans being made for surgery on the fifth metatarsal head in June however his blood sugar was apparently too high for anesthesia. Apparently the area was debrided and opened again in June and it is never closed since. Looking over the records from podiatry I am really not able to follow this. It was clear when he was first seen it was before 5/14 at that point he already had a wound. By 5/17 the ulcer was resolved. I do not see anything about a procedure. On 5/28 noted to have pre-ulcerative moderate keratosis. X-ray noted 1/5 contracted toe and tailor's bunion and metatarsal deformity. On a visit date on 09/28/2018 the dorsal part of the left foot it healed and resolved. There was concern about swelling in his lower extremity he was sent to the ER.. As far as I can tell he was seen in the ER on 7/12 with an ulcer on his left foot. A DVT rule out of the left leg was negative. I do not think I have complete records from podiatry but I am not able to verify the procedures this patient states he had. He states after the last procedure the wound has never closed although I am not able to follow this in the records I have from podiatry. He has not had a recent x-ray The patient has been using Neosporin on the wound. He is wearing a Darco shoe. He is still very active up on his foot working and exercising. Past  medical history; type 2 diabetes ketosis-prone, leg swelling with a negative DVT study in July. Non-smoker ABI in our clinic was 0.85 on the left 10/16; substantial wound on the plantar left fifth met head extending laterally almost to the dorsal fifth MTP. We have been using silver alginate we gave him a Darco forefoot off loader. An x-ray did not show evidence of osteomyelitis did note soft tissue emphysema which I think was due to gas tracking through an open wound. There is no doubt in my mind he requires an MRI 10/23; MRI not booked until 3 November at the earliest this is largely due to his glucose sensor in the right arm. We have been using silver alginate. There has been an improvement 10/29; I am still not exactly sure when his MRI is booked for. He says it is the third but it is the 10th in epic. This definitely needs to be done. He is running a low-grade fever today but no other symptoms. No real improvement in the 1 02/26/2019 patient presents today for a follow-up visit here in our clinic he is last been seen in the clinic on October 29. Subsequently we were working on getting MRI to evaluate and see what exactly was going on and where we would need to go from the standpoint of whether or not he had osteomyelitis and again what treatments were going be required. Subsequently the patient ended up being admitted to the hospital on 02/07/2019 and was discharged on 02/14/2019. This is a somewhat interesting admission with a discharge diagnosis of pneumonia due to COVID-19 although he was positive for COVID-19 when tested at the urgent care but negative x2 when he was actually in the hospital. With that being said he did have acute respiratory failure with hypoxia and it was noted he also have a left foot ulceration with osteomyelitis. With that being said he did require oxygen for his pneumonia and I level 4 L. He was placed on antivirals and steroids  for the COVID-19. He was also transferred  to the [facility] at one point. Nonetheless he did subsequently discharged home and since being home has done much better in that regard. The CT angiogram did not show any pulmonary embolism. With regard to the osteomyelitis the patient was placed on vancomycin and Zosyn while in the hospital but has been changed to Augmentin at discharge. It was also recommended that he follow- up with wound care and podiatry. Podiatry however wanted him to see [name] according to the patient prior to them doing anything further. His hemoglobin A1c was 9.9 as noted in the hospital. Have an MRI of the left foot performed while in the hospital on 02/04/2019. This showed evidence of septic arthritis at the fifth MTP joint and osteomyelitis involving the fifth metatarsal head and proximal phalanx. There is an overlying plantar open wound noted an abscess tracking back along the lateral aspect of the fifth metatarsal shaft. There is otherwise diffuse cellulitis and mild fasciitis without findings of polymyositis. The patient did have recently pneumonia secondary to COVID-19 I looked in the chart through epic and it does appear that the patient may need to have an additional x-ray just to ensure everything is cleared and that he has no airspace disease prior to putting him into the [name]. 03/05/2019; patient was readmitted to the clinic last week. He was hospitalized twice for a viral upper respiratory tract infection from 11/1 through 11/4 and then 11/5 through 11/12 ultimately this turned out to be Covid pneumonitis. Although he was discharged on oxygen he is not using it. He says he feels fine. He has no exercise limitation no cough no sputum. His O2 sat in our clinic today was 100% on room air. Lucente, [name] ([id number]) [id number] Page 12 of 22 He did manage to have his MRI which showed septic arthritis at the fifth MTP joint and osteomyelitis involving the fifth metatarsal head and  proximal phalanx. He received Vanco and Zosyn in the hospital and then was discharged on 2 weeks of Augmentin. I do not see any relevant cultures. He was supposed to follow-up with infectious disease but I do not see that he has an appointment. 12/8; patient saw Dr. Novella Olive of infectious disease last week. He felt that he had had adequate antibiotic therapy. He did not go to follow-up with Dr. Amalia Hailey of podiatry and I have again talked to him about the pros and cons of this. He does not want to consider a ray amputation of this time. He is aware of the risks of recurrence, migration etc. He started HBO today and tolerated this well. He can complete the Augmentin that I gave him last week. I have looked over the lab work that Dr. Chana Bode ordered his C-reactive protein was 3.3 and his sedimentation rate was 17. The C-reactive protein is never really been measurably that high in this patient 12/15; not much change in the wound today however he has undermining along the lateral part of the foot again more extensively than last week. He has some rims of epithelialization. We have been using silver alginate. He is undergoing hyperbarics but did not dive today 12/18; in for his obligatory first total contact cast change. Unfortunately there was pus coming from the undermining area around his fifth metatarsal head. This was cultured but will preclude reapplication of a cast. He is seen in conjunction with HBO 12/24; patient had staph lugdunensis in the wound in the undermining area laterally last time. We put  him on doxycycline which should have covered this. The wound looks better today. I am going to give him another week of doxycycline before reattempting the total contact cast 12/31; the patient is completing antibiotics. Hemorrhagic debris in the distal part of the wound with some undermining distally. He also had hyper granulation. Extensive debridement with a #5 curette. The infected area that was on the  lateral part of the fifth met head is closed over. I do not think he needs any more antibiotics. Patient was seen prior to HBO. Preparations for a total contact cast were made in the cast will be placed post hyperbarics 04/11/19; once again the patient arrives today without complaint. He had been in a cast all week noted that he had heavy drainage this week. This resulted in large raised areas of macerated tissue around the wound 1/14; wound bed looks better slightly smaller. Hydrofera Blue has been changing himself. He had a heavy drainage last week which caused a lot of maceration around the wound so I took him out of a total contact cast he says the drainage is actually better this week He is seen today in conjunction with HBO 1/21; returns to clinic. He was up in [place] for a day or 2 attending a funeral. He comes back in with the wound larger and with a large area of exposed bone. He had osteomyelitis and septic arthritis of the fifth left metatarsal head while he was in hospital. He received IV antibiotics in the hospital for a prolonged period of time then 3 weeks of Augmentin. Subsequently I gave him 2 weeks of doxycycline for more superficial wound infection. When I saw this last week the wound was smaller the surface of the wound looks satisfactory. 1/28; patient missed hyperbarics today. Bone biopsy I did last time showed Enterococcus faecalis and Staphylococcus lugdunensis . He has a wide area of exposed bone. We are going to use silver alginate as of today. I had another ethical discussion with the patient. 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 In this situation I think the likelihood of healing this is low. Therefore I have recommended a ray amputation and with the patient's agreement I have referred him to Dr. Doran Durand. The other issue is that his compliance with hyperbarics has been minimal because of his work schedule and given his underlying  decision I am going to stop this today READMISSION 10/24/2019 MRI 09/29/2019 left foot IMPRESSION: 1. Apparent skin ulceration inferior and lateral to the 5th metatarsal base with underlying heterogeneous T2 signal and enhancement in the subcutaneous fat. Small peripherally enhancing fluid collections along the plantar and lateral aspects of the 5th metatarsal base suspicious for abscesses. 2. Interval amputation through the mid 5th metatarsal with nonspecific low-level marrow edema and enhancement. Given the proximity to the adjacent soft tissue inflammatory changes, osteomyelitis cannot be excluded. 3. The additional bones appear unremarkable. MRI 09/29/2019 right foot IMPRESSION: 1. Soft tissue ulceration lateral to the 5th MTP joint. There is low-level T2 hyperintensity within the 4th and 5th metatarsal heads and adjacent proximal phalanges without abnormal T1 signal or cortical destruction. These findings are nonspecific and could be seen with early marrow edema, hyperemia or early osteomyelitis. No evidence of septic joint. 2. Mild tenosynovitis and synovial enhancement associated with the extensor digitorum tendons at the level of the midfoot. 3. Diffuse low-level muscular T2 hyperintensity and enhancement, most consistent with diabetic myopathy. LEFT FOOT BONE Methicillin resistant staphylococcus aureus Staphylococcus lugdunensis MIC MIC  CIPROFLOXACIN >=8 RESISTANT Resistant <=0.5 SENSI... Sensitive CLINDAMYCIN <=0.25 SENS... Sensitive >=8 RESISTANT Resistant ERYTHROMYCIN >=8 RESISTANT Resistant >=8 RESISTANT Resistant GENTAMICIN <=0.5 SENSI... Sensitive <=0.5 SENSI... Sensitive Inducible Clindamycin NEGATIVE Sensitive NEGATIVE Sensitive OXACILLIN >=4 RESISTANT Resistant 2 SENSITIVE Sensitive RIFAMPIN <=0.5 SENSI... Sensitive <=0.5 SENSI... Sensitive TETRACYCLINE <=1 SENSITIVE Sensitive <=1 SENSITIVE Sensitive TRIMETH/SULFA <=10 SENSIT Sensitive <=10 SENSIT Sensitive ...  Marland Kitchen.. VANCOMYCIN 1 SENSITIVE Sensitive <=0.5 SENSI... Sensitive Right foot bone . Component 3 wk ago Specimen Description BONE Special Requests RIGHT 4 METATARSAL SAMPLE B Gram Stain NO WBC SEEN NO ORGANISMS SEEN Culture RARE METHICILLIN RESISTANT STAPHYLOCOCCUS AUREUS NO ANAEROBES ISOLATED Carver, [name] ([id number] Page 13 of 22 Performed at [facility], [address] [place], [place] [address] Report Status 10/08/2019 FINAL Organism ID, Bacteria METHICILLIN RESISTANT STAPHYLOCOCCUS AUREUS Resulting Agency [facility] Susceptibility Methicillin resistant staphylococcus aureus MIC CIPROFLOXACIN >=8 RESISTANT Resistant CLINDAMYCIN <=0.25 SENS... Sensitive ERYTHROMYCIN >=8 RESISTANT Resistant GENTAMICIN <=0.5 SENSI... Sensitive Inducible Clindamycin NEGATIVE Sensitive OXACILLIN >=4 RESISTANT Resistant RIFAMPIN <=0.5 SENSI... Sensitive TETRACYCLINE <=1 SENSITIVE Sensitive TRIMETH/SULFA <=10 SENSIT Sensitive ... VANCOMYCIN 1 SENSITIVE Sensitive This is a patient we had in clinic earlier this year with a wound over his left fifth metatarsal head. 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 Doran Durand on 05/16/2019. He seems to have developed problems with wounds on his bilateral feet in June 2021 although he may have had problems earlier than this. He was in an urgent care with a right foot ulcer on 09/26/2019 and given a course of doxycycline. This was apparently after having trouble getting into see orthopedics. He was seen by podiatry on 09/28/2019 noted to have bilateral lower extremity ulcers including the left lateral fifth metatarsal base and the right subfifth  met head. It was noted that had purulent drainage at that time. He required hospitalization from 6/20 through 7/2. This was because of worsening right foot wounds. He underwent bilateral operative incision and drainage and bone biopsies bilaterally. Culture results are listed above. He has been referred back to clinic by Dr. Jacqualyn Posey of podiatry. He is also followed by Dr. Megan Salon who saw him yesterday. He was discharged from hospital on Zyvox Flagyl and Levaquin and yesterday changed to doxycycline Flagyl and Levaquin. His inflammatory markers on 6/26 showed a sedimentation rate of 129 and a C-reactive protein of 5. This is improved to 14 and 1.3 respectively. This would indicate improvement. ABIs in our clinic today were 1.23 on the right and 1.20 on the left 11/01/2019 on evaluation today patient appears to be doing fairly well in regard to the wounds on his feet at this point. Fortunately there is no signs of active infection at this time. No fevers, chills, nausea, vomiting, or diarrhea. He currently is seeing infectious disease and still under their care at this point. Subsequently he also has both wounds which she has not been using collagen on as he did not receive that in his packaging he did not call us and let us know that. Apparently that just was missed on the order. Nonetheless we will get that straightened out today. 8/9-Patient returns for bilateral foot wounds, using Prisma with hydrogel moistened dressings, and the wounds appear stable. Patient using surgical shoes, avoiding much pressure or  weightbearing as much as possible 8/16; patient has bilateral foot wounds. 1 on the right lateral foot proximally the other is on the left mid lateral foot. Both required debridement of callus and thick skin around the wounds. We have been using silver collagen 8/27; patient has bilateral lateral foot wounds. The area on the left substantially surrounded by callus and dry skin. This was removed  from the wound edge. The underlying wound is small. The area on the right measured somewhat smaller today. We've been using silver collagen the patient was on antibiotics for underlying osteomyelitis in the left foot. Unfortunately I did not update his antibiotics during today's visit. 9/10 I reviewed Dr. Hale Bogus last notes he felt he had completed antibiotics his inflammatory markers were reasonably well controlled. He has a small wound on the lateral left foot and a tiny area on the right which is just above closed. He is using Hydrofera Blue with border foam he has bilateral surgical shoes 9/24; 2 week f/u. doing well. right foot is closed. left foot still undermined. 10/14; right foot remains closed at the fifth met head. The area over the base of the left fifth metatarsal has a small open area but considerable undermining towards the plantar foot. Thick callus skin around this suggests an adequate pressure relief. We have talked about this. He says he is going to go back into his cam boot. I suggested a total contact cast he did not seem enamored with this suggestion 10/26; left foot base of the fifth metatarsal. Same condition as last time. He has skin over the area with an open wound however the skin is not adherent. He went to see Dr. Earleen Newport who did an x-ray and culture of his foot I have not reviewed the x-ray but the patient was not told anything. He is on doxycycline 11/11; since the patient was last here he was in the emergency room on 10/30 he was concerned about swelling in the left foot. They did not do any cultures or x-rays. They changed his antibiotics to cephalexin. Previous culture showed group B strep. The cephalexin is appropriate as doxycycline has less than predictable coverage. Arrives in clinic today with swelling over this area under the wound. He also has a new wound on the right fifth metatarsal head 11/18; the patient has a difficult wound on the lateral aspect of the  left fifth metatarsal head. The wound was almost ballotable last week I opened it slightly expecting to see purulence however there was just bleeding. I cultured this this was negative. X-ray unchanged. We are trying to get an MRI but I am not sure were going to be able to get this through his insurance. He also has an area on the right lateral fifth metatarsal head this looks healthier 12/3; the patient finally got our MRI. Surprisingly this did not show osteomyelitis. I did show the soft tissue ulceration at the lateral plantar aspect of the fifth metatarsal base with a tiny residual 6 mm abscess overlying the superficial fascia I have tried to culture this area I have not been able to get this to grow anything. Nevertheless the protruding tissue looks aggravated. I suspect we should try to treat the underlying "abscess with broad-spectrum antibiotics. I am going to start him on Levaquin and Flagyl. He has much less edema in his legs and I am going to continue to wrap his legs and see him weekly 12/10. I started Levaquin and Flagyl on him last week. He just  picked up the Flagyl apparently there was some delay. The worry is the wound on the left fifth metatarsal base which is substantial and worsening. His foot looks like he inverts at the ankle making this a weightbearing surface. Certainly no improvement in fact I think the measurements of this are somewhat worse. We have been using 12/17; he apparently just got the Levaquin yesterday this is 2 weeks after the fact. He has completed the Flagyl. The area over the left fifth metatarsal base still has protruding granulation tissue although it does not look quite as bad as it did some weeks ago. He has severe bilateral lymphedema although we have not been treating him for wounds on his legs this is definitely going to require compression. There was so much edema in the left I did not wish to put him in a total contact cast today. I am going to increase  his compression from 3-4 layer. The area on the right lateral fifth met head actually look quite good and superficial. 12/23; patient arrived with callus on the right fifth met head and the substantial hyper granulated callused wound on the base of his fifth metatarsal. He says he is completing his Levaquin in 2 days but I do not think that adds up with what I gave him but I will have to double check this. We are using Hydrofera Blue on both areas. My plan is to put the left leg in a cast the week after New Year's 04/06/2020; patient's wounds about the same. Right lateral fifth metatarsal head and left lateral foot over the base of the fifth metatarsal. There is undermining on the left lateral foot which I removed before application of total contact cast continuing with Hydrofera Blue new. Patient tells me he was seen by endocrinology today lab work was done [Dr. Kerr]. Also wondering whether he was referred to cardiology. I went over some lab work from previously does not have chronic renal failure certainly not nephrotic range proteinuria he does have very poorly controlled diabetes but this is not his most updated lab work. Hemoglobin A1c has been over 11 1/10; the patient had a considerable amount of leakage towards mid part of his left foot with macerated skin however the wound surface looks better the area on the right lateral fifth met head is better as well. I am going to change the dressing on the left foot under the total contact cast to silver alginate, continue with Hydrofera Blue on the right. 1/20; patient was in the total contact cast for 10 days. Considerable amount of drainage although the skin around the wound does not look too bad on the left foot. The area on the right fifth metatarsal head is closed. Our nursing staff reports large amount of drainage out of the left lateral foot wound 1/25; continues with copious amounts of drainage described by our intake staff. PCR culture I did  last week showed E. coli and Enterococcus faecalis and low quantities. Multiple resistance genes documented including extended spectrum beta lactamase, MRSA, MRSE, quinolone, tetracycline. The wound is not quite Sebastian, [name] ([id number]) [id number] Page 14 of 22 as good this week as it was 5 days ago but about the same size 2/3; continues with copious amounts of malodorous drainage per our intake nurse. The PCR culture I did 2 weeks ago showed E. coli and low quantities of Enterococcus. There were multiple resistance genes detected. I put Neosporin on him last week although this does not seem to have helped.  The wound is slightly deeper today. Offloading continues to be an issue here although with the amount of drainage she has a total contact cast is just not going to work 2/10; moderate amount of drainage. Patient reports he cannot get his stocking on over the dressing. I told him we have to do that the nurse gave him suggestions on how to make this work. The wound is on the bottom and lateral part of his left foot. Is cultured predominantly grew low amounts of Enterococcus, E. coli and anaerobes. There were multiple resistance genes detected including extended spectrum beta lactamase, quinolone, tetracycline. I could not think of an easy oral combination to address this so for now I am going to do topical antibiotics provided by [facility] I think the main agents here are vancomycin and an aminoglycoside. We have to be able to give him access to the wounds to get the topical antibiotic on 2/17; moderate amount of drainage this is unchanged. He has his Keystone topical antibiotic against the deep tissue culture organisms. He has been using this and changing the dressing daily. Silver alginate on the wound surface. 2/24; using Keystone antibiotic with silver alginate on the top. He had too much drainage for a total contact cast at one point although I think that is  improving and I think in the next week or 2 it might be possible to replace a total contact cast I did not do this today. In general the wound surface looks healthy however he continues to have thick rims of skin and subcutaneous tissue around the wide area of the circumference which I debrided 06/04/2020 upon evaluation today patient appears to be doing well in regard to his wound. I do feel like he is showing signs of improvement. There is little bit of callus and dead tissue around the edges of the wound as well as what appears to be a little bit of a sinus tract that is off to the side laterally I would perform debridement to clear that away today. 3/17; left lateral foot. The wound looks about the same as I remember. Not much depth surface looks healthy. No evidence of infection 3/25; left lateral foot. Wound surface looks about the same. Separating epithelium from the circumference. There really is no evidence of infection here however not making progress by my view 3/29; left lateral foot. Surface of the wound again looks reasonably healthy still thick skin and subcutaneous tissue around the wound margins. There is no evidence of infection. One of the concerns being brought up by the nurses has again the amount of drainage vis--vis continued use of a total contact cast 4/5; left lateral foot at roughly the base of the fifth metatarsal. Nice healthy looking granulated tissue with rims of epithelialization. The overall wound measurements are not any better but the tissue looks healthy. The only concern is the amount of drainage although he has no surrounding maceration with what we have been doing recently to absorb fluid and protect his skin. He also has lymphedema. 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 He states that the kerlix/coban under the TCC rolled down and could not get it back up. He has been using an offloading scooter and has somehow  hurt his right foot using this device. This happened last week. He states that the side of his right foot developed a blister and opened. The top of his foot also has a few small open wounds he thinks is due to his socks rubbing in his shoes. He has not been using any dressings to the wound. He denies purulent drainage, fever/chills or erythema to the wounds. 4/22; patient presents for 1 week follow-up. He developed new wounds to the right foot that were evaluated at last clinic visit. He continues to have a total contact cast to the left leg and he reports no issues. He has been using silver collagen to the right foot wounds with no issues. He denies purulent drainage, fever/chills or erythema to the right foot wounds. He has no complaints today 4/25; patient presents for 1 week follow-up. He has a total contact cast of the left leg and reports no issues. He has been using silver alginate to the right foot wound. He denies purulent drainage, fever/chills or erythema to the right foot wounds. 5/2 patient presents for 1 week follow-up. T contact cast on the left. The wound which is on the base of the plantar foot at the base of the fifth metatarsal otal actually looks quite good and dimensions continue to gradually contract. HOWEVER the area on the right lateral fifth metatarsal head is much larger than what I remember from 2 weeks ago. Once more is he has significant levels of hypergranulation. Noteworthy that he had this same hyper granulated response on his wound on the left foot at one point in time. So much so that he I thought there was an underlying fluid collection. Based on this I think this just needs debridement. 5/9; the wound on the left actually continues to be gradually smaller with a  healthy surface. Slight amount of drainage and maceration of the skin around but not too bad. However he has a large wound over the right fifth metatarsal head very much in the same configuration as his left foot wound was initially. I used silver nitrate to address the hyper granulated tissue no mechanical debridement 5/16; area on the left foot did not look as healthy this week deeper thick surrounding macerated skin and subcutaneous tissue. oo The area on the right foot fifth met head was about the same oo The area on the right ankle that we identified last week is completely broken down into an open wound presumably a stocking rubbing issue 5/23; patient has been using a total contact cast to the left side. He has been using silver alginate underneath. He has also been using silver alginate to the right foot wounds. He has no complaints today. He denies any signs of infection. 5/31; the left-sided wound looks some better measure smaller surface granulation looks better. We have been using silver alginate under the total contact cast oo The large area on his right fifth met head and right dorsal foot look about the same still using silver alginate 6/6; neither side is good as I was hoping although the surface area dimensions are better. A lot of maceration on his left and right foot around the wound edge. Area on the dorsal right foot looks better. He says he was traveling. I am not sure what does the amount of maceration around the plantar wounds may be drainage issues 6/13; in general the wound surfaces look quite good on both sides. Macerated skin and raised edges around the wound required debridement although in  general especially on the left the surface area seems improved. oo The area on the right dorsal ankle is about the same I thought this would not be such a problem to close 6/20; not much change in either wound although the one on the right looks a little better. Both wounds have  thick macerated edges to the skin requiring debridements. We have been using silver alginate. The area on the dorsal right ankle is still open I thought this would be closed. 6/28; patient comes in today with a marked deterioration in the right foot wound fifth met head. Wide area of exposed bone this is a drastic change from last time. The area on the left there we have been casting is stagnant. We have been using silver alginate in both wound areas. 7/5; bone culture I did for PCR last time was positive for Pseudomonas, group B strep, Enterococcus and Staph aureus. There was no suggestion of methicillin resistance or ampicillin resistant genes. This was resistant to tetracycline however He comes into the clinic today with the area over his right plantar fifth metatarsal head which had been doing so well 2 weeks ago completely necrotic feeling bone. I do not know that this is going to be salvageable. The left foot wound is certainly no smaller but it has a better surface and is superficial. 7/8; patient called in this morning to say that his total contact cast was rubbing against his foot. He states he is doing fine overall. He denies signs of infection. 7/12; continued deterioration in the wound over the right fifth metatarsal head crumbling bone. This is not going to be salvageable. The patient agrees and wants to be referred to Dr. Doran Durand which we will attempt to arrange as soon as possible. I am going to continue him on antibiotics as long as that takes so I will renew those today. The area on the left foot which is the base of the fifth metatarsal continues to look somewhat better. Healthy looking tissue no depth no debridement is necessary here. 7/20; the patient was kindly seen by Dr. Doran Durand of orthopedics on 10/19/2020. 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 Towards this end we have taken him  out of the total contact cast on the left we will put him in a wrap with Hydrofera Blue. As I understand things surgery is planned for 7/21 7/27; patient had his surgery last Thursday. 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 Apparently everything went well we did not still disturb that today Archdale, [name] ([id number]) [id number] Page 15 of 22 The area on the left foot actually looks quite good. He has been much less mobile which probably explains this he did not seem to do well in the total contact cast secondary to drainage and maceration I think. 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 Fortunately there is no evidence of active infection at this time. No fevers, chills, nausea, vomiting, or diarrhea. Overall I think that he is actually doing extremely well. Nonetheless I do believe that he is staying off of this more following the surgery in his right foot that is the reason the left is doing so great. 8/16; left plantar foot wound. This looks smaller than the last time I saw this he is  using Hydrofera Blue. The surgical wound on the right foot is being followed by Dr. Doran Durand we did not look at this today. He has surgical shoes on both feet 8/23; left plantar foot wound not as good this week. Surrounding macerated skin and subcutaneous tissue everything looks moist and wet. I do not think he is offloading this adequately. He is using a surgical shoe Apparently the right foot surgical wound is not open although I did not check his foot 8/31; left plantar foot lateral aspect. Much improved this week. He has no maceration. Some improvement in the surface area of the wound but most impressively the depth is come in we are using silver alginate. The patient is a Product/process development scientist. He is asked that we write him a letter so he can go back to work. I have  also tried to see if we can write something that will allow him to limit the amount of time that he is on his foot at work. 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 Hopefully the latter can be avoided 9/6; I believe the patient missed an appointment last week. He arrives in today with a wound looking roughly the same certainly no better. Undermining laterally and also inferiorly. We used molecuLight today in training with the patient's permission.. We are using silver alginate 9/21 wound is measuring bigger this week although this may have to do with the aggressive circumferential debridement last week in response to the blush fluorescence on the MolecuLight. Culture I did last week showed significant MSSA and E. coli. I put him on Augmentin but he has not started it yet. We are also going to send this for compounded antibiotics at [facility]. There is no evidence of systemic infection 9/29; silver alginate. His Keystone arrived. He is completing Augmentin in 2 days. Offloading in a cam boot. Moderate drainage per our intake staff 10/5; using silver alginate. He has been using his [place]. He has completed his Augmentin. Per our intake nurse still a lot of drainage, far too much to consider a total contact cast. Wound measures about the same. He had the same undermining area that I defined last week from a roughly 11-3. I remove this today 10/12; using silver alginate he is using the Sunshine. He comes in for a nurse visit hence we are applying [employer] twice a week. Measuring slightly better today and less notable drainage. Extensive debridement of the wound edge last time 10/18; using topical Keystone and silver alginate and a soft cast. Wound measurements about the same. Drainage was through his soft cast. We are changing this twice a week Tuesdays and Friday 10/25; comes in with moderate drainage. Still using Keystone  silver alginate and a soft cast. Wound dimensions completely the same.He has a lot of edema in the left leg he has lymphedema. Asking for [name] to consider wrapping him as he cannot get his stocking on over the soft cast 11/2; comes in with moderate to large drainage slightly smaller in terms of width we have been using [place]. His wound looks satisfactory but not much improvement 11/4; patient presents today for obligatory cast change. Has no issues or complaints today. He denies signs of infection. 11/9; patient traveled this weekend to [place], was on the cast quite a bit. Staining of the cast with black material from his walking boot. Drainage was not quite as bad as we feared. Using  silver alginate and Keystone 11/16; we do not have size for cast therefore we have been putting a soft cast on him since the change on Friday. Still a significant amount of drainage necessitating changing twice a week. We have been using the Keystone at cast changes either hard or soft as well as silver alginate Comes in the clinic with things actually looking fairly good improvement in width. He says his offloading is about the same 02/24/2021 upon evaluation today patient actually comes back in and is doing excellent in regard to his foot ulcer this is significantly smaller even compared to the last visit. The soft cast seems to have done extremely well for him which is great news. I do not see any signs of infection minimal debridement will be needed today. 11/30; left lateral foot much improved half a centimeter improvement in surface area. No evidence of infection. He seems to be doing better with the soft cast in the TCC therefore we will continue with this. He comes back in later in the week for a change with the nurses. This is due to drainage 12/6; no improvement in dimensions. Under illumination some debris on the surface we have been using silver alginate, soft cast. If there is anything optimistic here he seems  to have have less drainage 12/13. Dimensions are improved both length and width and slightly in depth. Appears to be quite healthy today. Raised edges of this thick skin and callus around the edges however. He is in a soft cast were bringing him back once for a change on Friday. Drainage is better 12/20. Dimensions are improved. He still has raised edges of thick skin and callus around the edges. We are using a soft cast 12/28; comes in today with thick callus around the wound. Using silver under alginate under a soft cast. I do not think there is much improvement in any measurement 2023 04/06/2021; patient was put in a total contact cast. Unfortunately not much change in surface area 1/10; not much different still thick callus and skin around the edge in spite of the total contact cast. This was just debrided last week we have been using the [facility] compounded antibiotic and silver alginate under a total contact cast 1/18 the patient's wound on the left side is doing nicely. smaller HOWEVER he comes in today with a wound on the right foot laterally. blister most likely serosangquenous drainage 1/24; the patient continues to do well in terms of the plantar left foot which is continued to contract using silver alginate under the total contact cast HOWEVER the right lateral foot is bigger with denuded skin around the edges. I used pickups and a #15 scalpel to remove this this looks like the remanence of a large blister. Cannot rule out infection. Culture in this area I did last week showed Staphylococcus lugdunensis few colonies. I am going to try to address this with his [employer] antibiotic that is done so well on the left having linezolid and this should cover the staph 2/1; the patient's wound on his left foot which was the original plantar foot wound thick skin and eschar around the edges even in the total contact cast but the wound surface does not look too bad The real problem is on how his  right lateral foot at roughly the base of the fifth metatarsal. The wound is completely necrotic more worrisome than that there is swelling around the edges of this. We have been using silver alginate on both wounds and  Keystone on the right foot. Unfortunately I think he is going to require systemic antibiotics while we await cultures. He did not get the x-ray done that we ordered last week [lost the prescription 2/7; disappointingly in the area on the left foot which we are treating with a total contact cast is still not closed although it is much smaller. He continues to have a lot of callus around the wound edge. -Right lateral foot culture I did last week was negative x-ray also negative for osteomyelitis. 2/15: TCC silver alginate on the left and silver alginate on the right lateral. No real improvement in either area 05/26/2021: T oday, the wounds are roughly the same size as at his previous visit, post-debridement. He continues to endorse fairly substantial drainage, particularly on the right. He has been in a total contact cast on the left. There is still some callus surrounding this lesion. On the right, the periwound skin is quite macerated, along with surrounding callus. The center of the right-sided wound also has some dark, densely adherent material, which is very difficult to remove. Lotz, [name] ([id number]) [id number] Page 16 of 22 06/02/2021: Today, both wounds are slightly smaller. He has been using zinc oxide ointment around the right ulcer and the degree of maceration has improved markedly. There continues to be an area of nonviable tissue in the center of the right sided ulcer. The left-sided wound, which has been in the total contact cast. Appears clean and the degree of callus around it is less than previously. 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 He had to take the stairs and  both wounds have increased in size. The left foot, which has been in a total contact cast, has developed a tunnel tracking to the lateral aspect of his foot. The nonviable tissue in the center of the right-sided ulcer remains recalcitrant to debridement. There is significant undermining surrounding the entirety of the left sided wound. 06/16/2021: The elevator at school has been fixed and the patient has been able to avoid putting as much weight on his wounds over the past week. We converted the left foot wound into a single lesion today, but despite this, the wound is actually smaller. The base is healthy with limited periwound callus. On the right, the central necrotic area is still present. He continues to be quite macerated around the right sided wound, despite applying barrier cream. This does, however, have the benefit of softening the callus to make it more easily removable. 06/23/2021: Today, the left wound is smaller. The lateral aspect that had opened up previously is now closed. The wound base has a healthy bed of granulation tissue and minimal slough. Unfortunately, on the right, the wound is larger and continues to be fairly macerated. He has also reopened the wound at his right ankle. He thinks this is due to the gait he has adopted secondary to his total contact cast and boot. 06/30/2021: T oday, both wounds are a little bit larger. The lateral aspect on the left has remained closed. He continues to have significant periwound maceration. The culture that I took from the right sided wound grew a population of bacteria that is not covered by his current [facility] antibiotic. The center of the right- sided wound continues to appear necrotic with nonviable fat. It probes deeper today, but does not reach bone. 07/07/2021: The periwound maceration is a little bit less today. The right lateral foot wound has some  areas that appear more viable and the necrotic center also looks a little bit better.  The wound on the dorsal surface of his right foot near the ankle is contracting and the surface appears healthy. The left plantar wound surface looks healthy, but there is some new undermining on the medial portion. He did get his new Keystone antibiotic and began applying that to the right foot wound on Saturday. 07/14/2021: The intake nurse reported substantial drainage from his wounds, but the periwound skin actually looks better than is typical for him. The wound on the dorsal surface of his right foot near the ankle is smaller and just has a small open area underneath some dried eschar. The left plantar wound surface looks healthy and there has been no significant accumulation of callus. The right lateral foot wound looks quite a bit better, with the central portion, which typically appears necrotic, looking more viable albeit pale. 07/22/2021: His left foot is extremely macerated today. The wound is about the same size. The wound on the dorsal surface of his right foot near the ankle had closed, but he traumatized it removing the dressing and there is a tiny skin tear in that location. The right lateral foot wound is bigger, but the surface appears healthy. 07/30/2021: The wound on the dorsal surface of his right foot near the ankle is closed. The right lateral foot wound again is a little bit bigger due to some undermining. The periwound skin is in better condition, however. He has been applying zinc oxide. The wound surface is a little bit dry today. On the left, he does not have the substantial maceration that we frequently see. The wound itself is smaller and has a clean surface. 08/06/2021: Both wounds seem to have deteriorated over the past week. The right lateral foot wound has a dry surface but the periwound is boggy.. Overall wound dimensions are about the same. On the left, the wound is about the same size, but there is more undermining present underneath periwound callus. 08/13/2021: The  right sided wound looks about the same, but on the left there has been substantial deterioration. The undermining continues to extend under periwound callus. Once this was removed, substantial extension of the wound was present. There is no odor or purulent drainage but clearly the wounds have broken down. 08/20/2021: The wounds look about the same today. He has been out of his total contact cast and has just been changing the dressings using topical Keystone with PolyMem Ag, Kerlix and Ace bandages. The wound on the top of his right ankle has reopened but this is quite small. There was a little bit of purulent material that I expressed when examining this wound. 08/24/2021: After the aggressive debridement I performed at his last visit, the wounds actually look a little bit better today. They are smaller with the exception of the wound on the top of his right ankle which is a little bit bigger as some more skin pulled off when he was changing his dressing. We are using topical Keystone with PolyMem Ag Kerlix and Ace bandages. 09/02/2021: There has been really no change to any of his wounds. 09/16/2021: The patient was hospitalized last week with nausea, vomiting, and dehydration. He says that while he was in the hospital, his wounds were not really addressed properly. T oday, both plantar foot wounds are larger and the periwound skin is macerated. The wound on the dorsum of his right foot has a scab on the top. The right  foot now has a crater where previously he had had nonviable fat. It looks as though this simply died and fell out. The periwound callus is wet. 09/24/2021: His wounds have deteriorated somewhat since his last visit. The wound on the dorsum of his right foot near his ankle is larger and has more nonviable tissue present. The crater in his right foot is even deeper; I cannot quite palpate or probe to bone but I am sure it is close. The wound on his left plantar foot has an odd boggy area in  the center that almost feels as though it has fluid within it. He has run out of his topical Keystone antibiotic. We are using silver alginate on his wounds. 09/29/2021: He has developed a new wound on the dorsum of his left foot near his ankle. He says he thinks his wrapping is rubbing in that site. I would concur with this as the wound on his right ankle is larger. The left foot looks about the same. The right foot has the crater that was present last week. No significant slough accumulation, but his foot remains quite swollen and warm despite oral antibiotic therapy. 10/08/2021: All of his wounds look about the same as last week. He did not start his oral antibiotics that are prescribed until just a couple of days ago; his [employer] compounded antibiotics formula has been changed and he is awaiting delivery of the new recipe. His MRI that was scheduled for earlier this week was canceled as no prior authorization had been obtained; unfortunately the tech responsible sent an email to my old [employer] email, which I no longer use nor have access to. 10/18/2021: The wounds on his bilateral dorsal feet near the ankles are both improved. They are smaller and have just some eschar and slough buildup. The left plantar wound has a fair amount of undermining, but the surface is clean. There is some periwound callus accumulation. On the right plantar foot, there is nonviable fat leading to a deep tunnel that tracks towards his dorsal medial foot. There is periwound callus and slough accumulation, as well. His right foot and leg remain swollen as compared to the left. 10/25/2021: The wounds on his bilateral dorsal feet and at the ankles have broken down somewhat. They are little bit larger than last week. The left plantar wound continues to undermine laterally but the surface is clean. The right plantar foot wound shows some decreased depth in the tunnel tracking towards his dorsal medial foot. He has not yet had  the Doppler study that I ordered; it sounds like there is some confusion about the scheduling of the procedure. In addition, the MRI was denied and I have taken steps to appeal the denial. 11/24/2021: Since our last visit, Mr. Jelen was admitted to the hospital where an MRI suggested osteomyelitis. He was taken the operating room by podiatry. Bone biopsies were negative for osteomyelitis. They debrided his wounds and applied myriad matrix. He saw them last week and they removed his staples. He is here today to continue his wound healing process. T oday, both of the dorsal foot/ankle wounds are substantially smaller. There is just a little eschar overlying the left sided wound and some eschar and slough on the right. The right plantar foot ulcer has the healthiest surface of granulation tissue that I have seen to date. A portion of the myriad matrix failed to take and was hanging loose. It appears that myriad morcells were placed into the tunnel closest  to the dorsal portion of his foot. These have sloughed off. The left plantar foot ulcer is about the same size, but has a much healthier surface than in the past. Both plantar ulcers have callus and slough accumulation. Gural, [name] ([id number]) [id number] Page 17 of 22 12/07/2021: Left dorsal foot/ankle wound is closed. The right dorsal foot/ankle wound is nearly closed and just has a small open area with some eschar and slough. The right plantar foot wound has contracted quite a bit since our last visit. It has a healthy surface with just a little bit of slough accumulation and periwound callus buildup. The left plantar foot wound is about the same size but the surface appears healthy. There is a little slough and periwound callus on this side, as well. 12/15/2021: Both dorsal foot/ankle wounds are closed. The right plantar foot wound is substantially smaller than at our last visit. The tunneling that was present has  nearly closed. There is just a little bit of slough buildup. The left plantar foot wound is also a little bit smaller today. The surface is the healthiest that I have ever seen it. Light slough and periwound callus accumulation on this side. 12/22/2021: The dorsal ankle/foot wounds remain closed. The right plantar foot wound continues to contract. There is still a bit of depth at the lateral portion of the wound but the surface has a good granulated appearance. The wound on the left is about the same size, the central indented portion is still adherent. It also is clean with good granulation tissue present. The periwound skin and callus are a bit macerated, however. 12/29/2021: Both ankle wounds remain closed. The right plantar foot wound is less than half the size that it was last week. There is no depth any longer and the surface has just a little bit of slough and periwound callus. On the left, the wound is not much smaller, but the surface is healthier with good granulation tissue. There is also a little slough and periwound callus buildup. 01/05/2022: The right plantar foot wound continues to contract and is once again about half the size as it was last week. There is just a little bit of surface slough and periwound callus. On the left, the depth of the wound has decreased and the diameter has started to contract. It is also very clean with just a little slough and biofilm present. 01/12/2022: The right plantar foot wound is smaller again today. There is just some periwound callus and slough accumulation. On the left, there is a fair amount of undermining and the surface is a little boggy, but no other significant change. 01/19/2022: The right plantar wound continues to contract. There is minimal periwound callus accumulation and just a light layer of slough on the surface. On the left, the surface looks better, but there is fairly significant undermining near the 11:00 portion of the wound, aiming  towards his great toe. The skin overlying this area is very healthy and has a good fat layer present. No malodor or purulent drainage. 01/26/2022: The right sided wound continues to contract and has just a light layer of slough on the surface. Minimal periwound callus. On the left, he still has substantial undermining and the tissue surface is not as robust as I would like to see. No malodor or purulent drainage, but he did say he had a lot of serous drainage over the weekend. 02/02/2022: The right foot wound is about a quarter of the size that it was at his  last visit. There is just a little slough on the surface and some periwound callus. On the left, the undermining persists and the tissue is still more pale, but he does not seem to have had as much drainage over the last weekend. We are waiting on a wound VAC. 02/09/2022: His right foot has healed. He received the wound VAC, but did not bring it to clinic with him today. The lateral aspect of the left foot wound has come in a little, but he still has substantial undermining on the medial and distal portion of the wound. Still with macerated periwound callus. 02/16/2022: The wound VAC was applied last Friday. T oday, there has been tremendous improvement in the surface of the wound bed. The undermined portion of the wound has come in a bit. There is still some overhanging dead skin. Overall the wound looks much better. 02/23/2022: No significant change in the overall wound dimensions, but the wound surface continues to improve and appears healthier and more robust. There is some periwound callus accumulation and overhanging skin. No concern for infection. 03/02/2022: Although the measurements taken in clinic today were unchanged, the visual appearance of the wound looks like it is smaller and the undermining/tunneling is filling with flesh. There is less periwound tissue maceration than usual. No malodor or purulent drainage. No concern for  infection. 03/09/2022: The undermining has come in by couple of millimeters. The periwound is a little bit more macerated this week. No concern for infection. 12/13; substantial wound on the left plantar foot. We are using silver collagen and a wound VAC. 03/23/2022: The undermining continues to contract. The wound surface is a little bit dry. 03/30/2022: The undermining has essentially resolved. There is still some depth at the center of the wound. The wound surface has good beefy tissue and the moisture balance is better. 04/06/2022: The wound dimensions are about the same, except that the depth is beginning to fill in. He has had more drainage this week, but there is no obvious concern for infection. 04/13/2022: He has built up a little bit of callus around the edges of the wound, creating some undermining. Otherwise the wound looks fairly unchanged. 04/20/2022: The wound bed tissue looks very healthy and robust. Light biofilm on the surface. There is some built up wet callus around the wound edges, but no undermining. 04/27/2022: The wound surface continues to look very healthy. Although the wound dimensions measured the same, it looks smaller to me. No real callus accumulation this week. Minimal slough on the surface. 05/04/2022: The wound measured smaller and shallower this week. We are still awaiting insurance approval for Apligraf. 05/11/2022: The wound is about the same in terms of depth but measured a little bit narrower today. He has built up a rim of moist callus around the edges. He has been approved for Apligraf and we will apply that today. 05/18/2022: The wound measured a little bit smaller again today. No significant callus buildup this week. 05/25/2022: The wound was measured about the same size today, but the multiple nooks and crannies in the depths of the wound seem to be filling in. 06/01/2022: The wound dimensions are smaller today, with the exception of the depth. There is a little bit  of moist callus around the edges. 06/08/2022: The wound continues to contract aside from the depth. Once again, there is moist callus around the edges. The odor is less profound this week. 3/13; patient presents for follow-up. He has had 5 applications of Apligraf and  is currently not approved for more by insurance. He has also had a wound VAC in place. He has no issues or complaints today. 06/22/2022: The depth of the wound has come in somewhat. There is some undermining created by skin encroaching over the top of the wound. The wound continues to have a pungent odor. 06/29/2022: The wound dimensions are smaller, with the exception of the depth. There is some undermining created by overlying skin and callus. There is still some odor, but it is not as strong as it was on prior occasions. The culture that I took last week returned with a polymicrobial population, Pseudomonas dominant. He is currently taking levofloxacin and a new Keystone prescription should be delivered later this week. Platts, [name] ([id number]) [id number] Page 18 of 22 07/06/2022: The depth of the wound has come in by about half a centimeter. He was up on his feet a bit more over the weekend and he says that he has been sliding in his shoe; the result is that he has developed a bit more undermining. The odor from his wound is gone. Patient History Information obtained from Patient. Family History No family history of Cancer, Diabetes, Hereditary Spherocytosis, Hypertension, Kidney Disease, Lung Disease, Seizures, Stroke, Thyroid Problems, Tuberculosis. Social History Never smoker, Marital Status - Single, Alcohol Use - Rarely, Drug Use - No History, Caffeine Use - Never. Medical History Eyes Denies history of Cataracts, Glaucoma, Optic Neuritis Ear/Nose/Mouth/Throat Denies history of Chronic sinus problems/congestion, Middle ear problems Hematologic/Lymphatic Denies history of Anemia, Hemophilia,  Human Immunodeficiency Virus, Lymphedema, Sickle Cell Disease Respiratory Denies history of Aspiration, Asthma, Chronic Obstructive Pulmonary Disease (COPD), Pneumothorax, Sleep Apnea, Tuberculosis Cardiovascular Denies history of Angina, Arrhythmia, Congestive Heart Failure, Coronary Artery Disease, Deep Vein Thrombosis, Hypertension, Hypotension, Myocardial Infarction, Peripheral Arterial Disease, Peripheral Venous Disease, Phlebitis, Vasculitis Gastrointestinal Denies history of Cirrhosis , Colitis, Crohnoos, Hepatitis A, Hepatitis B, Hepatitis C Endocrine Patient has history of Type II Diabetes Denies history of Type I Diabetes Immunological Denies history of Lupus Erythematosus, Raynaudoos, Scleroderma Integumentary (Skin) Denies history of History of Burn Musculoskeletal Denies history of Gout, Rheumatoid Arthritis, Osteoarthritis, Osteomyelitis Neurologic Denies history of Dementia, Neuropathy, Quadriplegia, Paraplegia, Seizure Disorder Oncologic Denies history of Received Chemotherapy, Received Radiation Psychiatric Denies history of Anorexia/bulimia, Confinement Anxiety Hospitalization/Surgery History - 11/1-11/06/2018- sepsis foot infection. - 11/4-11/5 02 sats low respiratory distress. Objective Constitutional no acute distress. Vitals Time Taken: 8:21 AM, Height: 77 in, Weight: 280 lbs, BMI: 33.2, Temperature: 98.2 F, Pulse: 99 bpm, Respiratory Rate: 18 breaths/min, Blood Pressure: 125/86 mmHg, Capillary Blood Glucose: 117 mg/dl. General Notes: glucose per pt report this am Respiratory Normal work of breathing on room air. General Notes: 07/06/2022: The depth of the wound has come in by about half a centimeter. He was up on his feet a bit more over the weekend and he says that he has been sliding in his shoe; the result is that he has developed a bit more undermining. The odor from his wound is gone. Integumentary (Hair, Skin) Wound #3 status is Open. Original cause of  wound was Trauma. The date acquired was: 10/02/2019. The wound has been in treatment 140 weeks. The wound is located on the [place]. The wound measures 2.3cm length x 2.8cm width x 1.5cm depth; 5.058cm^2 area and 7.587cm^3 volume. There is Fat Layer (Subcutaneous Tissue) exposed. There is no tunneling noted, however, there is undermining starting at 12:00 and ending at 12:00 with a maximum distance of 1.1cm. There is a medium amount of  serosanguineous drainage noted. The wound margin is epibole. There is large (67-100%) red granulation within the wound bed. There is a small (1-33%) amount of necrotic tissue within the wound bed including Adherent Slough. The periwound skin appearance had no abnormalities noted for color. The periwound skin appearance exhibited: Callus. The periwound skin appearance did not exhibit: Dry/Scaly, Maceration. Periwound temperature was noted as No Abnormality. 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 There was a Selective/Open Wound Skin/Epidermis Debridement with a total area of 6.44 sq cm performed by [name], MD. With the following instrument(s): Curette to remove Non-Viable tissue/material. Material removed includes Callus and Skin: Epidermis and. No specimens were taken. A time out was conducted at 08:50, prior to the start of the procedure. A Minimum amount of bleeding was controlled with Pressure. The procedure was tolerated well with a pain level of 0 throughout and a pain level of 0 following the procedure. Post Debridement Measurements:  2.3cm length x 2.8cm width x 1.5cm depth; 7.587cm^3 volume. Character of Wound/Ulcer Post Debridement is improved. Severity of Tissue Post Debridement is: Fat layer exposed. Post procedure Diagnosis Wound #3: Same as Pre-Procedure General Notes: Scribed for Dr. [name] by Baruch Gouty, RN. Plan Follow-up Appointments: Return Appointment in 1 week. - Dr. [name] - Room 1 with [facility] Friday 4/12 @ 2:45 pm Anesthetic: (In clinic) Topical Lidocaine 4% applied to wound bed Bathing/ Shower/ Hygiene: May shower and wash wound with soap and water. Negative Presssure Wound Therapy: Wound #3 Left,Lateral,Plantar Foot: Wound Vac to wound continuously at 180mm/hg pressure Black Foam Edema Control - Lymphedema / SCD / Other: Avoid standing for long periods of time. Exercise regularly Compression stocking or Garment 20-30 mm/Hg pressure to: - to both legs daily Off-Loading: Open toe surgical shoe to: - Both feet Additional Orders / Instructions: Follow Nutritious Diet Non Wound Condition: Apply the following to affected area as directed: - continue to apply foam donut or cushion to healed right plantar foot WOUND #3: - Foot Wound Laterality: Plantar, Left, Lateral Cleanser: Soap and Water 3 x Per Week/30 Days Discharge Instructions: May shower and wash wound with dial antibacterial soap and water prior to dressing change. Cleanser: Vashe 5.8 (oz) 3 x Per Week/30 Days Discharge Instructions: Cleanse the wound with Vashe prior to applying a clean dressing, let sit on wound for 5-10 minutesusing gauze sponges, not tissue or cotton balls. Peri-Wound Care: Skin Prep (Generic) 3 x Per Week/30 Days Discharge Instructions: Use skin prep as directed Topical: keystone antibiotic compound 3 x Per Week/30 Days Discharge Instructions: apply to base of wound Prim Dressing: Promogran Prisma Matrix, 4.34 (sq in) (silver collagen) (Dispense As Written) 3 x Per Week/30 Days ary Discharge Instructions: Moisten  collagen with saline or hydrogel Prim Dressing: VAC 3 x Per Week/30 Days ary Secondary Dressing: Zetuvit Absorbent Pad, 4x4 (in/in) (Dispense As Written) 3 x Per Week/30 Days Secured With: Elastic Bandage 4 inch (ACE bandage) (Generic) 3 x Per Week/30 Days Discharge Instructions: Secure with ACE bandage as directed. Secured With: 37M Medipore Public affairs consultant Surgical T 2x10 (in/yd) (Generic) 3 x Per Week/30 Days ape Discharge Instructions: Secure with tape as directed. Com pression Wrap: Kerlix Roll 4.5x3.1 (  in/yd) (Generic) 3 x Per Week/30 Days Discharge Instructions: Apply Kerlix and Coban compression as directed. 07/06/2022: The depth of the wound has come in by about half a centimeter. He was up on his feet a bit more over the weekend and he says that he has been sliding in his shoe; the result is that he has developed a bit more undermining. The odor from his wound is gone. I used a curette to debride skin, callus, and biofilm from his wound. This eliminated at least some of the undermining. We will continue to use his Keystone topical antibiotic compound (prescription drug) along with Prisma silver collagen and negative pressure wound therapy. We made him a new peg assist shoe, as the old one was worn out. He will complete his oral levofloxacin. Follow-up in 1 week. Electronic Signature(s) Signed: 07/06/2022 8:59:58 AM By: [name] MD FACS Entered By: [name] on 07/06/2022 08:59:58 Hackler, [name] ([id number] Page 20 of 22 -------------------------------------------------------------------------------- HxROS Details Patient Name: Date of Service: [name] 07/06/2022 8:15 A M Medical Record Number: [id number] Patient Account Number: [id number] Date of Birth/Sex: Treating RN: [birth year] (37 y.o. M) Primary Care Provider: [name] Other Clinician: Referring Provider: Treating Provider/Extender: [name] in Treatment: 140 Information Obtained From Patient Eyes Medical History: Negative for: Cataracts; Glaucoma; Optic Neuritis Ear/Nose/Mouth/Throat Medical History: Negative for: Chronic sinus problems/congestion; Middle ear problems Hematologic/Lymphatic Medical History: Negative for: Anemia; Hemophilia; Human Immunodeficiency Virus; Lymphedema; Sickle Cell Disease Respiratory Medical History: Negative for: Aspiration; Asthma; Chronic Obstructive Pulmonary Disease (COPD); Pneumothorax; Sleep Apnea; Tuberculosis Cardiovascular Medical History: Negative for: Angina; Arrhythmia; Congestive Heart Failure; Coronary Artery Disease; Deep Vein Thrombosis; Hypertension; Hypotension; Myocardial Infarction; Peripheral Arterial Disease; Peripheral Venous Disease; Phlebitis; Vasculitis Gastrointestinal Medical History: Negative for: Cirrhosis ; Colitis; Crohns; Hepatitis A; Hepatitis B; Hepatitis C Endocrine Medical History: Positive for: Type II Diabetes Negative for: Type I Diabetes Time with diabetes: 8 Treated with: Insulin Blood sugar tested every day: No Immunological Medical History: Negative for: Lupus Erythematosus; Raynauds; Scleroderma Integumentary (Skin) Medical History: Negative for: History of Burn Musculoskeletal Medical History: Negative for: Gout; Rheumatoid Arthritis; Osteoarthritis; Osteomyelitis Neurologic Medical History: Negative for: Dementia; Neuropathy; Quadriplegia; Paraplegia; Seizure Disorder Oncologic Medical History: Negative for: Received Chemotherapy; Received Radiation Dengel, [name] ([id number]) [id number] Page 21 of 22 Psychiatric Medical History: Negative for: Anorexia/bulimia; Confinement Anxiety Immunizations Pneumococcal Vaccine: Received Pneumococcal Vaccination: No Implantable Devices None Hospitalization / Surgery History Type of Hospitalization/Surgery 11/1-11/06/2018- sepsis foot  infection 11/4-11/5 02 sats low respiratory distress Family and Social History Cancer: No; Diabetes: No; Hereditary Spherocytosis: No; Hypertension: No; Kidney Disease: No; Lung Disease: No; Seizures: No; Stroke: No; Thyroid Problems: No; Tuberculosis: No; Never smoker; Marital Status - Single; Alcohol Use: Rarely; Drug Use: No History; Caffeine Use: Never; Financial Concerns: No; Food, Clothing or Shelter Needs: No; Support System Lacking: No; Transportation Concerns: No Electronic Signature(s) Signed: 07/06/2022 10:02:36 AM By: [name] MD FACS Entered By: [name] on 07/06/2022 08:58:06 -------------------------------------------------------------------------------- SuperBill Details Patient Name: Date of Service: [name], [name] 07/06/2022 Medical Record Number: [id number] Patient Account Number: [id number] Date of Birth/Sex: Treating RN: 05-May-1985 (37 y.o. M) Primary Care Provider: [name] Other Clinician: Referring Provider: Treating Provider/Extender: [name] in Treatment: 140 Diagnosis Coding ICD-10 Codes Code Description [phone] Non-pressure chronic ulcer of other part of left foot with other specified severity E11.621 Type 2 diabetes mellitus with foot ulcer Facility Procedures : CPT4 Code: [id number] Description: [phone] - DEBRIDE  WOUND 1ST 20 SQ CM OR < ICD-10 Diagnosis Description L97.528 Non-pressure chronic ulcer of other part of left foot with other specified severi Modifier: ty Quantity: 1 : CPT4 Code: [id number] Description: [id number] - WOUND VAC-50 SQ CM OR LESS Modifier: Quantity: 1 Physician Procedures : CPT4 Code Description Modifier [id number] - WC PHYS LEVEL 4 - EST PT 25 ICD-10 Diagnosis Description L97.528 Non-pressure chronic ulcer of other part of left foot with other specified severity E11.621 Type 2 diabetes mellitus with foot ulcer Quantity: 1 : [id number] - WC PHYS DEBR WO ANESTH 20 SQ CM ICD-10  Diagnosis Description L97.528 Non-pressure chronic ulcer of other part of left foot with other specified severity Quantity: 1 Electronic Signature(s) Signed: 07/06/2022 9:00:22 AM By: [name] MD Holland, [name] ([id number]) [id number] Page 22 of 22 Entered By: [name] on 07/06/2022 09:00:21

## 2022-07-07 NOTE — Progress Notes (Signed)
[name] ([id number]) [id number] Page 1 of 7 Visit Report for 07/06/2022 Arrival Information Details Patient Name: Date of Service: [name] D 07/06/2022 8:15 A M Medical Record Number: [id number] Patient Account Number: [id number] Date of Birth/Sex: Treating RN: 1985-12-03 (37 y.o. [name] Primary Care Emmylou Bieker: [name] Other Clinician: Referring Kamill Fulbright: Treating Sabastian Raimondi/Extender: [name] in Treatment: 48 Visit Information History Since Last Visit Added or deleted any medications: No Patient Arrived: Ambulatory Any new allergies or adverse reactions: No Arrival Time: 08:19 Had a fall or experienced change in No Accompanied By: self activities of daily living that may affect Transfer Assistance: None risk of falls: Patient Identification Verified: Yes Signs or symptoms of abuse/neglect since No Secondary Verification Process Completed: Yes last visito Patient Requires Transmission-Based Precautions: No Hospitalized since last visit: No Patient Has Alerts: No Implantable device outside of the clinic No excluding cellular tissue based products placed in the center since last visit: Has Dressing in Place as Prescribed: Yes Has Compression in Place as Prescribed: Yes Has Footwear/Offloading in Place as Yes Prescribed: Right: Surgical Shoe with Pressure Relief Insole Pain Present Now: No Electronic Signature(s) Signed: 07/06/2022 6:19:38 PM By: Baruch Gouty RN, BSN Entered By: Baruch Gouty on 07/06/2022 08:20:46 -------------------------------------------------------------------------------- Encounter Discharge Information Details Patient Name: Date of Service: [name], [name] 07/06/2022 8:15 A M Medical Record Number: [id number] Patient Account Number: [id number] Date of Birth/Sex: Treating RN: 08-25-1985 (37 y.o. [name] Primary Care Eliav Mechling: [name] Other  Clinician: Referring Donnetta Gillin: Treating Ayianna Darnold/Extender: [name] in Treatment: 140 Encounter Discharge Information Items Post Procedure Vitals Discharge Condition: Stable Temperature (F): 98.2 Ambulatory Status: Ambulatory Pulse (bpm): 99 Discharge Destination: Home Respiratory Rate (breaths/min): 18 Transportation: Private Auto Blood Pressure (mmHg): 125/86 Accompanied By: self Schedule Follow-up Appointment: Yes Clinical Summary of Care: Patient Declined Electronic Signature(s) Signed: 07/06/2022 6:19:38 PM By: Baruch Gouty RN, BSN Entered By: Baruch Gouty on 07/06/2022 09:17:13 -------------------------------------------------------------------------------- Lower Extremity Assessment Details Patient Name: Date of Service: [name], [name] 07/06/2022 8:15 A M Medical Record Number: [id number] Patient Account Number: [id number] Date of Birth/Sex: Treating RN: [birth year] (37 y.o. [name] Primary Care Genevieve Arbaugh: [name] Other Clinician: [place], [name] ([id number]) [id number] Page 2 of 7 Referring Selam Pietsch: Treating Malaney [name]/Extender: [name] in Treatment: 140 Edema Assessment Assessed: [Left: No] [Right: No] Edema: [Left: Ye] [Right: s] Calf Left: Right: Point of Measurement: 48 cm From Medial Instep 48.3 cm Ankle Left: Right: Point of Measurement: 11 cm From Medial Instep 29.5 cm Vascular Assessment Pulses: Dorsalis Pedis Palpable: [Left:Yes] Electronic Signature(s) Signed: 07/06/2022 6:19:38 PM By: Baruch Gouty RN, BSN Entered By: Baruch Gouty on 07/06/2022 08:31:41 -------------------------------------------------------------------------------- Multi Wound Chart Details Patient Name: Date of Service: [name], [name] 07/06/2022 8:15 A M Medical Record Number: [id number] Patient Account Number: [id number] Date of Birth/Sex: Treating RN: November 16, 1985 (37  y.o. M) Primary Care Izzabelle Bouley: [name] Other Clinician: Referring [name]: Treating Casidy Alberta/Extender: [name] in Treatment: 140 Vital Signs Height(in): 77 Capillary Blood Glucose(mg/dl): 117 Weight(lbs): 280 Pulse(bpm): 99 Body Mass Index(BMI): 33.2 Blood Pressure(mmHg): 125/86 Temperature(F): 98.2 Respiratory Rate(breaths/min): 18 [3:Photos:] [N/A:N/A] Left, Lateral, Plantar Foot N/A N/A Wound Location: Trauma N/A N/A Wounding Event: Diabetic Wound/Ulcer of the Lower N/A N/A Primary Etiology: Extremity Type II Diabetes N/A N/A Comorbid History: 10/02/2019 N/A N/A Date Acquired: 140 N/A N/A Weeks of Treatment: Open N/A N/A Wound Status: No N/A N/A Wound Recurrence:  2.3x2.8x1.5 N/A N/A Measurements L x W x D (cm) 5.058 N/A N/A A (cm) : rea 7.587 N/A N/A Volume (cm) : -206.70% N/A N/A % Reduction in A rea: -4498.20% N/A N/A % Reduction in Volume: 12 Starting Position 1 (o'clock): 12 Ending Position 1 (o'clock): 1.1 Maximum Distance 1 (cm): Yes N/A N/A UnderminingTavare Folan, [name] ([id number] Page 3 of 7 Grade 2 N/A N/A Classification: Medium N/A N/A Exudate A mount: Serosanguineous N/A N/A Exudate Type: red, brown N/A N/A Exudate Color: Epibole N/A N/A Wound Margin: Large (67-100%) N/A N/A Granulation A mount: Red N/A N/A Granulation Quality: Small (1-33%) N/A N/A Necrotic A mount: Fat Layer (Subcutaneous Tissue): Yes N/A N/A Exposed Structures: Fascia: No Tendon: No Muscle: No Joint: No Bone: No None N/A N/A Epithelialization: Debridement - Selective/Open Wound N/A N/A Debridement: Pre-procedure Verification/Time Out 08:50 N/A N/A Taken: Callus N/A N/A Tissue Debrided: Skin/Epidermis N/A N/A Level: 6.44 N/A N/A Debridement A (sq cm): rea Curette N/A N/A Instrument: Minimum N/A N/A Bleeding: Pressure N/A N/A Hemostasis A chieved: 0 N/A  N/A Procedural Pain: 0 N/A N/A Post Procedural Pain: Procedure was tolerated well N/A N/A Debridement Treatment Response: 2.3x2.8x1.5 N/A N/A Post Debridement Measurements L x W x D (cm) 7.587 N/A N/A Post Debridement Volume: (cm) Callus: Yes N/A N/A Periwound Skin Texture: Maceration: No N/A N/A Periwound Skin Moisture: Dry/Scaly: No No Abnormalities Noted N/A N/A Periwound Skin Color: No Abnormality N/A N/A Temperature: Debridement N/A N/A Procedures Performed: Negative Pressure Wound Therapy Maintenance (NPWT) Treatment Notes Electronic Signature(s) Signed: 07/06/2022 8:55:01 AM By: [name] MD FACS Entered By: [name] on 07/06/2022 08:55:01 -------------------------------------------------------------------------------- Multi-Disciplinary Care Plan Details Patient Name: Date of Service: [name], [name] 07/06/2022 8:15 A M Medical Record Number: [id number] Patient Account Number: [id number] Date of Birth/Sex: Treating RN: [birth year] (37 y.o. [name] Primary Care Gaetana Kawahara: [name] Other Clinician: Referring Joylynn Defrancesco: Treating Simaya Lumadue/Extender: [name] in Treatment: [place] reviewed with physician Active Inactive Nutrition Nursing Diagnoses: Imbalanced nutrition Potential for alteratiion in Nutrition/Potential for imbalanced nutrition Goals: Patient/caregiver agrees to and verbalizes understanding of need to use nutritional supplements and/or vitamins as prescribed Date Initiated: 10/24/2019 Date Inactivated: 04/06/2020 Target Resolution Date: 04/03/2020 Goal Status: Met Patient/caregiver will maintain therapeutic glucose control Date Initiated: 10/24/2019 Target Resolution Date: 07/27/2022 Goal Status: Active Interventions: Assess HgA1c results as ordered upon admission and as needed [place], [name] ([id number]) [phone].pdf Page 4 of 7 Assess patient  nutrition upon admission and as needed per policy Provide education on elevated blood sugars and impact on wound healing Provide education on nutrition Treatment Activities: Education provided on Nutrition : 12/07/2021 Notes: 11/17/20: Glucose control ongoing issue, target date extended. 01/26/21: Glucose management continues. Wound/Skin Impairment Nursing Diagnoses: Impaired tissue integrity Knowledge deficit related to ulceration/compromised skin integrity Goals: Patient/caregiver will verbalize understanding of skin care regimen Date Initiated: 10/24/2019 Target Resolution Date: 07/27/2022 Goal Status: Active Ulcer/skin breakdown will have a volume reduction of 30% by week 4 Date Initiated: 10/24/2019 Date Inactivated: 01/16/2020 Target Resolution Date: 01/10/2020 Unmet Reason: no change in Goal Status: Unmet measurements. Interventions: Assess patient/caregiver ability to obtain necessary supplies Assess patient/caregiver ability to perform ulcer/skin care regimen upon admission and as needed Assess ulceration(s) every visit Provide education on ulcer and skin care Notes: 11/17/20: Wound care regimen continues Electronic Signature(s) Signed: 07/06/2022 6:19:38 PM By: Baruch Gouty RN, BSN Entered By: Baruch Gouty on 07/06/2022 08:44:02 -------------------------------------------------------------------------------- Negative Pressure Wound Therapy Maintenance (NPWT) Details Patient Name: Date  of Service: [name] D 07/06/2022 8:15 A M Medical Record Number: [id number] Patient Account Number: [id number] Date of Birth/Sex: Treating RN: 05-Jun-1985 (37 y.o. [name] Primary Care Deryck Hippler: [name] Other Clinician: Referring Jadd Gasior: Treating Imelda Dandridge/Extender: [name] in Treatment: 140 NPWT Maintenance Performed for: Wound #3 Left, Lateral, Plantar Foot Performed By: Baruch Gouty, RN Coverage Size (sq cm): 6.44 Pressure  Type: Constant Pressure Setting: 125 mmHG Drain Type: None Primary Contact: Other : prism and antibiotic compound Sponge/Dressing Type: Foam- Black Date Initiated: 02/11/2022 Dressing Removed: Yes Quantity of Sponges/Gauze Removed: 1 Canister Changed: Yes Canister Exudate Volume: 100 Dressing Reapplied: Yes Quantity of Sponges/Gauze Inserted: 1 Respones T Treatment: o good Days On NPWT : 146 Post Procedure Diagnosis Same as Pre-procedure Electronic Signature(s) Signed: 07/06/2022 6:19:38 PM By: Baruch Gouty RN, BSN Entered By: Baruch Gouty on 07/06/2022 08:54:18 [place], [name] ([id number].pdf Page 5 of 7 -------------------------------------------------------------------------------- Pain Assessment Details Patient Name: Date of Service: [name] D 07/06/2022 8:15 A M Medical Record Number: [id number] Patient Account Number: [id number] Date of Birth/Sex: Treating RN: April 03, 1986 (37 y.o. [name] Primary Care Tracie Dore: [name] Other Clinician: Referring Jacari Iannello: Treating Julias Mould/Extender: [name] in Treatment: 140 Active Problems Location of Pain Severity and Description of Pain Patient Has Paino No Site Locations Rate the pain. Current Pain Level: 0 Pain Management and Medication Current Pain Management: Electronic Signature(s) Signed: 07/06/2022 6:19:38 PM By: Baruch Gouty RN, BSN Entered By: Baruch Gouty on 07/06/2022 08:21:11 -------------------------------------------------------------------------------- Patient/Caregiver Education Details Patient Name: Date of Service: [name], [name] 4/3/2024andnbsp8:15 A M Medical Record Number: [id number] Patient Account Number: [id number] Date of Birth/Gender: Treating RN: [birth year] (37 y.o. [name] Primary Care Physician: [name] Other Clinician: Referring Physician: Treating Physician/Extender: [name] in Treatment: 140 Education Assessment Education Provided To: Patient Education Topics Provided Offloading: Methods: Explain/Verbal Responses: Reinforcements needed, State content correctly Wound/Skin Impairment: Methods: Explain/Verbal Responses: Reinforcements needed, State content correctly Electronic Signature(s) Signed: 07/06/2022 6:19:38 PM By: Baruch Gouty RN, BSN [place], [name] ([id number]) By: Baruch Gouty RN, BSN [phone].pdf Page 6 of 7 Signed: 07/06/2022 6:19:38 PM Entered By: Baruch Gouty on 07/06/2022 08:44:31 -------------------------------------------------------------------------------- Wound Assessment Details Patient Name: Date of Service: [name] D 07/06/2022 8:15 A M Medical Record Number: [id number] Patient Account Number: [id number] Date of Birth/Sex: Treating RN: Jan 02, 1986 (37 y.o. [name] Primary Care Amandeep Hogston: [name] Other Clinician: Referring Yasmene Salomone: Treating Karlina Suares/Extender: [name] in Treatment: 140 Wound Status Wound Number: 3 Primary Etiology: Diabetic Wound/Ulcer of the Lower Extremity Wound Location: Left, Lateral, Plantar Foot Wound Status: Open Wounding Event: Trauma Comorbid History: Type II Diabetes Date Acquired: 10/02/2019 Weeks Of Treatment: 140 Clustered Wound: No Photos Wound Measurements Length: (cm) 2.3 Width: (cm) 2.8 Depth: (cm) 1.5 Area: (cm) 5.058 Volume: (cm) 7.587 % Reduction in Area: -206.7% % Reduction in Volume: -4498.2% Epithelialization: None Tunneling: No Undermining: Yes Starting Position (o'clock): 12 Ending Position (o'clock): 12 Maximum Distance: (cm) 1.1 Wound Description Classification: Grade 2 Wound Margin: Epibole Exudate Amount: Medium Exudate Type: Serosanguineous Exudate Color: red, brown Foul Odor After Cleansing: No Slough/Fibrino Yes Wound Bed Granulation Amount:  Large (67-100%) Exposed Structure Granulation Quality: Red Fascia Exposed: No Necrotic Amount: Small (1-33%) Fat Layer (Subcutaneous Tissue) Exposed: Yes Necrotic Quality: Adherent Slough Tendon Exposed: No Muscle Exposed: No Joint Exposed: No Bone Exposed: No Periwound Skin Texture Texture Color No Abnormalities Noted: No  No Abnormalities Noted: Yes Callus: Yes Temperature / Pain Temperature: No Abnormality Moisture No Abnormalities Noted: No Dry / Scaly: No Maceration: No [place], [name] ([id number].pdf Page 7 of 7 Treatment Notes Wound #3 (Foot) Wound Laterality: Plantar, Left, Lateral Cleanser Soap and Water Discharge Instruction: May shower and wash wound with dial antibacterial soap and water prior to dressing change. Vashe 5.8 (oz) Discharge Instruction: Cleanse the wound with Vashe prior to applying a clean dressing, let sit on wound for 5-10 minutesusing gauze sponges, not tissue or cotton balls. Peri-Wound Care Skin Prep Discharge Instruction: Use skin prep as directed Topical keystone antibiotic compound Discharge Instruction: apply to base of wound Primary Dressing Promogran Prisma Matrix, 4.34 (sq in) (silver collagen) Discharge Instruction: Moisten collagen with saline or hydrogel VAC Secondary Dressing Zetuvit Absorbent Pad, 4x4 (in/in) Secured With Elastic Bandage 4 inch (ACE bandage) Discharge Instruction: Secure with ACE bandage as directed. 28M Medipore Soft Cloth Surgical T 2x10 (in/yd) ape Discharge Instruction: Secure with tape as directed. Compression Wrap Kerlix Roll 4.5x3.1 (in/yd) Discharge Instruction: Apply Kerlix and Coban compression as directed. Compression Stockings Add-Ons Electronic Signature(s) Signed: 07/06/2022 6:19:38 PM By: Baruch Gouty RN, BSN Entered By: Baruch Gouty on 07/06/2022 08:46:34 -------------------------------------------------------------------------------- Vitals  Details Patient Name: Date of Service: [name], [name] 07/06/2022 8:15 A M Medical Record Number: [id number] Patient Account Number: [id number] Date of Birth/Sex: Treating RN: 03-Sep-1985 (37 y.o. [name] Primary Care Alonza Knisley: [name] Other Clinician: Referring Dezaray Shibuya: Treating Chavie Kolinski/Extender: [name] in Treatment: 140 Vital Signs Time Taken: 08:21 Temperature (F): 98.2 Height (in): 77 Pulse (bpm): 99 Weight (lbs): 280 Respiratory Rate (breaths/min): 18 Body Mass Index (BMI): 33.2 Blood Pressure (mmHg): 125/86 Capillary Blood Glucose (mg/dl): 117 Reference Range: 80 - 120 mg / dl Notes glucose per pt report this am Electronic Signature(s) Signed: 07/06/2022 6:19:38 PM By: Baruch Gouty RN, BSN Entered By: Baruch Gouty on 07/06/2022 08:23:31

## 2022-07-13 ENCOUNTER — Ambulatory Visit (HOSPITAL_BASED_OUTPATIENT_CLINIC_OR_DEPARTMENT_OTHER): Payer: BC Managed Care – PPO | Admitting: General Surgery

## 2022-07-15 ENCOUNTER — Encounter (HOSPITAL_BASED_OUTPATIENT_CLINIC_OR_DEPARTMENT_OTHER): Payer: BC Managed Care – PPO | Admitting: General Surgery

## 2022-07-15 DIAGNOSIS — E11621 Type 2 diabetes mellitus with foot ulcer: Secondary | ICD-10-CM | POA: Diagnosis not present

## 2022-07-15 NOTE — Progress Notes (Signed)
[place], [name] ([id number]) [id number] Page 1 of 4 Visit Report for 07/15/2022 Arrival Information Details Patient Name: Date of Service: [name] 07/15/2022 2:45 PM Medical Record Number: [id number] Patient Account Number: [id number] Date of Birth/Sex: Treating RN: [birth year] (36 y.o. [name] Primary Care Rutger Salton: [name] Other Clinician: Referring [name]: Treating [name]/Extender: [name] in Treatment: 142 Visit Information History Since Last Visit Added or deleted any medications: No Patient Arrived: Ambulatory Any new allergies or adverse reactions: No Arrival Time: 14:59 Had a fall or experienced change in No Accompanied By: self activities of daily living that may affect Transfer Assistance: None risk of falls: Patient Identification Verified: Yes Signs or symptoms of abuse/neglect since last visito No Patient Requires Transmission-Based Precautions: No Hospitalized since last visit: No Patient Has Alerts: No Implantable device outside of the clinic excluding No cellular tissue based products placed in the center since last visit: Has Dressing in Place as Prescribed: Yes Pain Present Now: Yes Electronic Signature(s) Signed: 07/15/2022 3:43:45 PM By: Max Schwalbe RN Entered By: [name] on 07/15/2022 14:59:29 -------------------------------------------------------------------------------- Encounter Discharge Information Details Patient Name: Date of Service: [name], [name] 07/15/2022 2:45 PM Medical Record Number: [id number] Patient Account Number: [id number] Date of Birth/Sex: Treating RN: 10/29/1985 (36 y.o. [name] Primary Care [name]: [name] Other Clinician: Referring [name]: Treating [name]/Extender: [name] in Treatment: [phone] Encounter Discharge Information Items Discharge Condition: Stable Ambulatory Status:  Ambulatory Discharge Destination: Home Transportation: Private Auto Accompanied By: self Schedule Follow-up Appointment: Yes Clinical Summary of Care: Patient Declined Electronic Signature(s) Signed: 07/15/2022 4:42:28 PM By: Max Deed RN, BSN Entered By: [name] on 07/15/2022 15:29:48 -------------------------------------------------------------------------------- Negative Pressure Wound Therapy Maintenance (NPWT) Details Patient Name: Date of Service: [name] 07/15/2022 2:45 PM Medical Record Number: [id number] Patient Account Number: [id number] Date of Birth/Sex: Treating RN: 10/23/1985 (36 y.o. [name] Primary Care Jahson Emanuele: [name] Other Clinician: Referring [name]: Treating [name]/Extender: [name] in Treatment: 142 NPWT Maintenance Performed for: Wound #3 Left, Lateral, Plantar Foot Performed By: Max Deed, RN Type: VAC System Coverage Size (sq cm): 6.44 [place], [name] ([id number]) [id number] Page 2 of 4 Pressure Type: Constant Pressure Setting: 125 mmHG Drain Type: None Primary Contact: Other : silver collagen Sponge/Dressing Type: Foam- Black Date Initiated: 02/11/2022 Dressing Removed: Yes Quantity of Sponges/Gauze Removed: 1 Canister Changed: No Dressing Reapplied: Yes Quantity of Sponges/Gauze Inserted: 1 Respones T Treatment: o good Days On NPWT : 155 Electronic Signature(s) Signed: 07/15/2022 4:42:28 PM By: Max Deed RN, BSN Entered By: [name] on 07/15/2022 15:28:48 -------------------------------------------------------------------------------- Pain Assessment Details Patient Name: Date of Service: [name], [name] 07/15/2022 2:45 PM Medical Record Number: [id number] Patient Account Number: [id number] Date of Birth/Sex: Treating RN: [birth year] (36 y.o. [name] Primary Care [name]: [name] Other  Clinician: Referring [name]: Treating [name]/Extender: [name] in Treatment: [phone] Active Problems Location of Pain Severity and Description of Pain Patient Has Paino No Site Locations Pain Management and Medication Current Pain Management: Electronic Signature(s) Signed: 07/15/2022 3:43:45 PM By: Max Schwalbe RN Entered By: [name] on 07/15/2022 15:00:03 -------------------------------------------------------------------------------- Patient/Caregiver Education Details Patient Name: Date of Service: [name] 4/12/2024andnbsp2:45 PM Medical Record Number: [id number] Patient Account Number: [id number] Date of Birth/Gender: Treating RN: [birth year] (36 y.o. [name] Primary Care Physician: [name] Other Clinician: Referring Physician: Treating Physician/Extender: [name]  in Treatment: 142 [name], [name] ([id number]) [phone].pdf Page 3 of 4 Education Assessment Education Provided To: Patient Education Topics Provided Offloading: Methods: Explain/Verbal Responses: Reinforcements needed, State content correctly Wound/Skin Impairment: Methods: Explain/Verbal Responses: Reinforcements needed, State content correctly Electronic Signature(s) Signed: 07/15/2022 4:42:28 PM By: Max Deed RN, BSN Entered By: [name] on 07/15/2022 15:29:32 -------------------------------------------------------------------------------- Wound Assessment Details Patient Name: Date of Service: [name], [name] 07/15/2022 2:45 PM Medical Record Number: [id number] Patient Account Number: [id number] Date of Birth/Sex: Treating RN: 02/14/1986 (36 y.o. [name] Primary Care Loy Mccartt: [name] Other Clinician: Referring [name]: Treating [name]/Extender: [name] in Treatment: 142 Wound Status Wound Number: 3 Primary  Etiology: Diabetic Wound/Ulcer of the Lower Extremity Wound Location: Left, Lateral, Plantar Foot Wound Status: Open Wounding Event: Trauma Date Acquired: 10/02/2019 Weeks Of Treatment: 142 Clustered Wound: No Wound Measurements Length: (cm) 2.3 Width: (cm) 2.8 Depth: (cm) 1.5 Area: (cm) 5.058 Volume: (cm) 7.587 % Reduction in Area: -206.7% % Reduction in Volume: -4498.2% Wound Description Classification: Grade 2 Exudate Amount: Medium Exudate Type: Serosanguineous Exudate Color: red, brown Periwound Skin Texture Texture Color No Abnormalities Noted: No No Abnormalities Noted: No Moisture No Abnormalities Noted: No Treatment Notes Wound #3 (Foot) Wound Laterality: Plantar, Left, Lateral Cleanser Soap and Water Discharge Instruction: May shower and Max wound with dial antibacterial soap and water prior to dressing change. Vashe 5.8 (oz) Discharge Instruction: Cleanse the wound with Vashe prior to applying a clean dressing, let sit on wound for 5-10 minutesusing gauze sponges, not tissue or cotton balls. Peri-Wound Care [place], [name] ([id number]) [id number] Page 4 of 4 Skin Prep Discharge Instruction: Use skin prep as directed Topical keystone antibiotic compound Discharge Instruction: apply to base of wound Primary Dressing Promogran Prisma Matrix, 4.34 (sq in) (silver collagen) Discharge Instruction: Moisten collagen with saline or hydrogel VAC Secondary Dressing Zetuvit Absorbent Pad, 4x4 (in/in) Secured With Elastic Bandage 4 inch (ACE bandage) Discharge Instruction: Secure with ACE bandage as directed. 55M Medipore Soft Cloth Surgical T 2x10 (in/yd) ape Discharge Instruction: Secure with tape as directed. Compression Wrap Kerlix Roll 4.5x3.1 (in/yd) Discharge Instruction: Apply Kerlix and Coban compression as directed. Compression Stockings Add-Ons Electronic Signature(s) Signed: 07/15/2022 4:42:28 PM By: Max Deed RN,  BSN Entered By: [name] on 07/15/2022 15:27:44 -------------------------------------------------------------------------------- Vitals Details Patient Name: Date of Service: [name], [name] 07/15/2022 2:45 PM Medical Record Number: [id number] Patient Account Number: [id number] Date of Birth/Sex: Treating RN: [birth year] (36 y.o. [name] Primary Care Sharisse Rantz: [name] Other Clinician: Referring Boyd [place]: Treating Dreshaun Stene/Extender: [name] in Treatment: 142 Vital Signs Time Taken: 14:59 Temperature (F): 98.5 Height (in): 77 Pulse (bpm): 90 Weight (lbs): 280 Respiratory Rate (breaths/min): 18 Body Mass Index (BMI): 33.2 Blood Pressure (mmHg): 139/87 Reference Range: 80 - 120 mg / dl Electronic Signature(s) Signed: 07/15/2022 3:43:45 PM By: Max Schwalbe RN Entered By: [name] on 07/15/2022 14:59:54

## 2022-07-18 NOTE — Progress Notes (Signed)
[place], [name] ([id number]) [id number] Page 1 of 1 Visit Report for 07/15/2022 SuperBill Details Patient Name: Date of Service: [name] 07/15/2022 Medical Record Number: [id number] Patient Account Number: [id number] Date of Birth/Sex: Treating RN: July 15, 1985 (36 y.o. 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

## 2022-07-20 ENCOUNTER — Encounter (HOSPITAL_BASED_OUTPATIENT_CLINIC_OR_DEPARTMENT_OTHER): Payer: BC Managed Care – PPO | Admitting: General Surgery

## 2022-07-20 DIAGNOSIS — E11621 Type 2 diabetes mellitus with foot ulcer: Secondary | ICD-10-CM | POA: Diagnosis not present

## 2022-07-21 NOTE — Progress Notes (Signed)
[place], [name] ([id number]) [id number] Page 1 of 7 Visit Report for 07/20/2022 Arrival Information Details Patient Name: Date of Service: [name] 07/20/2022 8:15 A M Medical Record Number: [id number] Patient Account Number: [id number] Date of Birth/Sex: Treating RN: [birth year] (37 y.o. M) Primary Care [name]: [name] Other Clinician: Referring [name]: Treating [name]/Extender: [name] in Treatment: 142 Visit Information History Since Last Visit All ordered tests and consults were completed: No Patient Arrived: Ambulatory Added or deleted any medications: No Arrival Time: 08:27 Any new allergies or adverse reactions: No Accompanied By: self Had a fall or experienced change in No Transfer Assistance: None activities of daily living that may affect Patient Identification Verified: Yes risk of falls: Secondary Verification Process Completed: Yes Signs or symptoms of abuse/neglect since last visito No Patient Requires Transmission-Based Precautions: No Hospitalized since last visit: No Patient Has Alerts: No Implantable device outside of the clinic excluding No cellular tissue based products placed in the center since last visit: Has Dressing in Place as Prescribed: Yes Has Compression in Place as Prescribed: Yes Pain Present Now: No Electronic Signature(s) Signed: 07/20/2022 5:05:24 PM By: Max Deed RN, BSN Entered By: [name] on 07/20/2022 08:37:31 -------------------------------------------------------------------------------- Encounter Discharge Information Details Patient Name: Date of Service: [name] 07/20/2022 8:15 A M Medical Record Number: [id number] Patient Account Number: [id number] Date of Birth/Sex: Treating RN: 1985/05/30 (37 y.o. [name] Primary Care Ludger Bones: [name] Other Clinician: Referring Shalana Jardin: Treating [name]/Extender: [name] in Treatment: [phone] Encounter Discharge Information Items Post Procedure Vitals Discharge Condition: Stable Temperature (F): 97.8 Ambulatory Status: Ambulatory Pulse (bpm): 92 Discharge Destination: Home Respiratory Rate (breaths/min): 18 Transportation: Private Auto Blood Pressure (mmHg): 129/84 Accompanied By: self Schedule Follow-up Appointment: Yes Clinical Summary of Care: Patient Declined Electronic Signature(s) Signed: 07/20/2022 5:05:24 PM By: Max Deed RN, BSN Entered By: [name] on 07/20/2022 09:21:11 -------------------------------------------------------------------------------- Lower Extremity Assessment Details Patient Name: Date of Service: [name] 07/20/2022 8:15 A M Medical Record Number: [id number] Patient Account Number: [id number] Date of Birth/Sex: Treating RN: 24-Dec-1985 (37 y.o. [name] Primary Care [name]: [name] Other Clinician: Referring Jayliana Valencia: Treating [name]/Extender: [name] in Treatment: 142 Edema Assessment Left: [Left: Right] [Right: :] Assessed: [Left: No] [Right: No] Edema: [Left: Ye] [Right: s] Calf Left: Right: Point of Measurement: 48 cm From Medial Instep 47.5 cm Ankle Left: Right: Point of Measurement: 11 cm From Medial Instep 28 cm Vascular Assessment Pulses: Dorsalis Pedis Palpable: [Left:Yes] Electronic Signature(s) Signed: 07/20/2022 5:05:24 PM By: Max Deed RN, BSN Entered By: [name] on 07/20/2022 08:39:02 -------------------------------------------------------------------------------- Multi Wound Chart Details Patient Name: Date of Service: [name] 07/20/2022 8:15 A M Medical Record Number: [id number] Patient Account Number: [id number] Date of Birth/Sex: Treating RN: 06-17-1985 (37 y.o. [name] Primary Care [name]: [name] Other Clinician: Referring [name]: Treating  [name]/Extender: [name] in Treatment: 142 Vital Signs Height(in): 77 Capillary Blood Glucose(mg/dl): 86 Weight(lbs): 010 Pulse(bpm): 92 Body Mass Index(BMI): 33.2 Blood Pressure(mmHg): 129/84 Temperature(F): 97.8 Respiratory Rate(breaths/min): 20 [3:Photos: No Photos Left, Lateral, Plantar Foot Wound Location: Trauma Wounding Event: Diabetic Wound/Ulcer of the Lower Primary Etiology: Extremity Type II Diabetes Comorbid History: 10/02/2019 Date Acquired: 142 Weeks of Treatment: Open Wound Status: No Wound  Recurrence: 2.2x3x1.3 Measurements L x W x [name] 5.184 A (cm) : rea 6.739 Volume (cm) : -214.40% % Reduction in A rea: -  3984.20% % Reduction in Volume: 12 Starting Position 1 (o'clock): 12 Ending Position 1 (o'clock): 1 Maximum Distance 1 (cm): Yes  Undermining: Grade 2 Classification: Medium Exudate A mount: Serosanguineous Exudate Type: red, brown Exudate Color: Well defined, not attached Wound Margin: Large (67-100%) Granulation A mount: Red, Pale Granulation Quality: Small (1-33%) Necrotic A  mount: Fat Layer (Subcutaneous Tissue): Yes N/A Exposed Structures: Fascia: No Tendon: No Muscle: No Joint: No Bone: No] [N/A:N/A N/A N/A N/A N/A N/A N/A N/A N/A N/A N/A N/A N/A N/A N/A N/A N/A N/A N/A N/A N/A N/A N/A] [name], [name] ([id number]) [3:Small (1-33%) Epithelialization: Debridement - Selective/Open Wound N/A Debridement: 08:50 Pre-procedure Verification/Time Out Taken: Callus Tissue Debrided: Skin/Epidermis Level: 10.5 Debridement A (sq cm): rea Curette, Forceps, Scissors Instrument:  Minimum Bleeding: Pressure Hemostasis A chieved: 0 Procedural Pain: 0 Post Procedural Pain: Procedure was tolerated well Debridement Treatment Response: 3.4x3x1.3 Post Debridement Measurements L x W x [name] 10.414 Post Debridement Volume: (cm) No  Abnormalities Noted Periwound Skin Texture: Maceration: Yes Periwound Skin Moisture: No Abnormalities Noted Periwound Skin  Color: No Abnormality Temperature: Debridement Procedures Performed: Negative Pressure Wound Therapy Maintenance (NPWT)] [N/A:N/A  N/A N/A N/A N/A N/A N/A N/A N/A N/A N/A N/A N/A N/A N/A N/A N/A N/A] Treatment Notes Electronic Signature(s) Signed: 07/20/2022 9:04:34 AM By: Duanne Guess MD FACS Signed: 07/20/2022 5:05:24 PM By: Max Deed RN, BSN Entered By: Duanne Guess on 07/20/2022 09:04:33 -------------------------------------------------------------------------------- Multi-Disciplinary Care Plan Details Patient Name: Date of Service: [name] 07/20/2022 8:15 A M Medical Record Number: [id number] Patient Account Number: [id number] Date of Birth/Sex: Treating RN: [birth year] (36 y.o. [name] Primary Care Tzivia Oneil: [name] Other Clinician: Referring Suha Schoenbeck: Treating Kyoko Elsea/Extender: [name] in Treatment: 142 Multidisciplinary Care Plan reviewed with physician Active Inactive Nutrition Nursing Diagnoses: Imbalanced nutrition Potential for alteratiion in Nutrition/Potential for imbalanced nutrition Goals: Patient/caregiver agrees to and verbalizes understanding of need to use nutritional supplements and/or vitamins as prescribed Date Initiated: 10/24/2019 Date Inactivated: 04/06/2020 Target Resolution Date: 04/03/2020 Goal Status: Met Patient/caregiver will maintain therapeutic glucose control Date Initiated: 10/24/2019 Target Resolution Date: 07/27/2022 Goal Status: Active Interventions: Assess HgA1c results as ordered upon admission and as needed Assess patient nutrition upon admission and as needed per policy Provide education on elevated blood sugars and impact on wound healing Provide education on nutrition Treatment Activities: Education provided on Nutrition : 02/02/2022 Notes: 11/17/20: Glucose control ongoing issue, target date extended. 01/26/21: Glucose management continues. Wound/Skin  Impairment Nursing Diagnoses: [place], [name] ([id number]) [id number] Page 4 of 7 Impaired tissue integrity Knowledge deficit related to ulceration/compromised skin integrity Goals: Patient/caregiver will verbalize understanding of skin care regimen Date Initiated: 10/24/2019 Target Resolution Date: 07/27/2022 Goal Status: Active Ulcer/skin breakdown will have a volume reduction of 30% by week 4 Date Initiated: 10/24/2019 Date Inactivated: 01/16/2020 Target Resolution Date: 01/10/2020 Unmet Reason: no change in Goal Status: Unmet measurements. Interventions: Assess patient/caregiver ability to obtain necessary supplies Assess patient/caregiver ability to perform ulcer/skin care regimen upon admission and as needed Assess ulceration(s) every visit Provide education on ulcer and skin care Notes: 11/17/20: Wound care regimen continues Electronic Signature(s) Signed: 07/20/2022 5:05:24 PM By: Max Deed RN, BSN Entered By: [name] on 07/20/2022 08:47:10 -------------------------------------------------------------------------------- Negative Pressure Wound Therapy Maintenance (NPWT) Details Patient Name: Date of Service: [name] 07/20/2022 8:15 A M Medical Record Number: [id number] Patient Account Number: [id number] Date of Birth/Sex: Treating RN: 07-17-1985 (36 y.o. [name] Primary Care Asriel Westrup:  [name] Other Clinician: Referring Winson Eichorn: Treating Dominic Mahaney/Extender: [name] in Treatment: 142 NPWT Maintenance Performed for: Wound #3 Left, Lateral, Plantar Foot Performed By: Max Deed, RN Coverage Size (sq cm): 6.6 Pressure Type: Constant Pressure Setting: 125 mmHG Drain Type: None Primary Contact: Silver Sponge/Dressing Type: Foam- Black Date Initiated: 02/11/2022 Dressing Removed: Yes Quantity of Sponges/Gauze Removed: 1 Canister Changed: No Dressing Reapplied:  Yes Quantity of Sponges/Gauze Inserted: 1 Respones T Treatment: o good Days On NPWT : 160 Post Procedure Diagnosis Same as Pre-procedure Electronic Signature(s) Signed: 07/20/2022 5:05:24 PM By: Max Deed RN, BSN Entered By: [name] on 07/20/2022 08:58:22 -------------------------------------------------------------------------------- Pain Assessment Details Patient Name: Date of Service: [name] 07/20/2022 8:15 A M Medical Record Number: [id number] Patient Account Number: [id number] Date of Birth/Sex: Treating RN: 04-05-1985 (36 y.o. M) Primary Care Shirah Roseman: [name] Other Clinician: Referring Hortense Cantrall: Treating Kyngston Pickelsimer/Extender: [name] in Treatment: [address] [place], [name] ([id number]) [id number] Page 5 of 7 Location of Pain Severity and Description of Pain Patient Has Paino No Site Locations Rate the pain. Current Pain Level: 0 Pain Management and Medication Current Pain Management: Electronic Signature(s) Signed: 07/20/2022 5:05:24 PM By: Max Deed RN, BSN Entered By: [name] on 07/20/2022 08:37:54 -------------------------------------------------------------------------------- Patient/Caregiver Education Details Patient Name: Date of Service: [name] 4/17/2024andnbsp8:15 A M Medical Record Number: [id number] Patient Account Number: [id number] Date of Birth/Gender: Treating RN: 03-09-1986 (36 y.o. [name] Primary Care Physician: [name] Other Clinician: Referring Physician: Treating Physician/Extender: [name] in Treatment: [phone] Education Assessment Education Provided To: Patient Education Topics Provided Offloading: Methods: Explain/Verbal Responses: Reinforcements needed, State content correctly Wound/Skin Impairment: Methods: Explain/Verbal Responses: Reinforcements needed, State content  correctly Electronic Signature(s) Signed: 07/20/2022 5:05:24 PM By: Max Deed RN, BSN Entered By: [name] on 07/20/2022 08:47:37 -------------------------------------------------------------------------------- Wound Assessment Details Patient Name: Date of Service: [name] 07/20/2022 8:15 A M Medical Record Number: [id number] Patient Account Number: [id number] Date of Birth/Sex: Treating RN: [birth year] (36 y.o. [name] Primary Care Geryl Dohn: [name] Other Clinician: Referring Eddy Liszewski: Treating Ivette Castronova/Extender: [name] in Treatment: [address], [name] ([id number]) [id number] Page 6 of 7 Wound Status Wound Number: 3 Primary Etiology: Diabetic Wound/Ulcer of the Lower Extremity Wound Location: Left, Lateral, Plantar Foot Wound Status: Open Wounding Event: Trauma Comorbid History: Type II Diabetes Date Acquired: 10/02/2019 Weeks Of Treatment: 142 Clustered Wound: No Photos Wound Measurements Length: (cm) 2.2 Width: (cm) 3 Depth: (cm) 1.3 Area: (cm) 5.184 Volume: (cm) 6.739 % Reduction in Area: -214.4% % Reduction in Volume: -3984.2% Epithelialization: Small (1-33%) Tunneling: No Undermining: Yes Starting Position (o'clock): 12 Ending Position (o'clock): 12 Maximum Distance: (cm) 1 Wound Description Classification: Grade 2 Wound Margin: Well defined, not attached Exudate Amount: Medium Exudate Type: Serosanguineous Exudate Color: red, brown Foul Odor After Cleansing: No Slough/Fibrino Yes Wound Bed Granulation Amount: Large (67-100%) Exposed Structure Granulation Quality: Red, Pale Fascia Exposed: No Necrotic Amount: Small (1-33%) Fat Layer (Subcutaneous Tissue) Exposed: Yes Necrotic Quality: Adherent Slough Tendon Exposed: No Muscle Exposed: No Joint Exposed: No Bone Exposed: No Periwound Skin Texture Texture Color No Abnormalities Noted: Yes No Abnormalities  Noted: Yes Moisture Temperature / Pain No Abnormalities Noted: No Temperature: No Abnormality Maceration: Yes Treatment Notes Wound #3 (Foot) Wound Laterality: Plantar, Left, Lateral Cleanser Soap and Water Discharge Instruction: May shower and wash wound with dial antibacterial soap and water prior to dressing change.  Vashe 5.8 (oz) Discharge Instruction: Cleanse the wound with Vashe prior to applying a clean dressing, let sit on wound for 5-10 minutesusing gauze sponges, not tissue or cotton balls. Peri-Wound Care Skin Prep Discharge Instruction: Use skin prep as directed Topical [place], [name] ([id number]) [id number] Page 7 of 7 keystone antibiotic compound Discharge Instruction: apply to base of wound Primary Dressing Promogran Prisma Matrix, 4.34 (sq in) (silver collagen) Discharge Instruction: Moisten collagen with saline or hydrogel VAC Secondary Dressing Zetuvit Absorbent Pad, 4x4 (in/in) Secured With Elastic Bandage 4 inch (ACE bandage) Discharge Instruction: Secure with ACE bandage as directed. 72M Medipore Soft Cloth Surgical T 2x10 (in/yd) ape Discharge Instruction: Secure with tape as directed. Compression Wrap Kerlix Roll 4.5x3.1 (in/yd) Discharge Instruction: Apply Kerlix and Coban compression as directed. Compression Stockings Add-Ons Electronic Signature(s) Signed: 07/20/2022 5:05:24 PM By: Max Deed RN, BSN Entered By: [name] on 07/20/2022 16:13:59 -------------------------------------------------------------------------------- Vitals Details Patient Name: Date of Service: [name] 07/20/2022 8:15 A M Medical Record Number: [id number] Patient Account Number: [id number] Date of Birth/Sex: Treating RN: June 21, 1985 (36 y.o. M) Primary Care Kathlene Yano: [name] Other Clinician: Referring Shaiann Mcmanamon: Treating Myisha Pickerel/Extender: [name] in Treatment: 142 Vital Signs Time  Taken: 08:28 Temperature (F): 97.8 Height (in): 77 Pulse (bpm): 92 Weight (lbs): 280 Respiratory Rate (breaths/min): 20 Body Mass Index (BMI): 33.2 Blood Pressure (mmHg): 129/84 Capillary Blood Glucose (mg/dl): 86 Reference Range: 80 - 120 mg / dl Notes glucose per pt report this am Electronic Signature(s) Signed: 07/20/2022 5:05:24 PM By: Max Deed RN, BSN Entered By: [name] on 07/20/2022 08:37:47

## 2022-07-21 NOTE — Progress Notes (Signed)
[place], [name] ([id number]) [id number] Page 1 of 22 Visit Report for 07/20/2022 Chief Complaint Document Details Patient Name: Date of Service: [name] D 07/20/2022 8:15 A M Medical Record Number: [id number] Patient Account Number: [id number] Date of Birth/Sex: Treating RN: [birth year] (36 y.o. [name] Primary Care Provider: [name] Other Clinician: Referring Provider: Treating Provider/Extender: [name] in Treatment: [phone] Information Obtained from: Patient Chief Complaint 01/11/2019; patient is here for review of a rather substantial wound over the left fifth plantar metatarsal head extending into the lateral part of his foot 10/24/2019; patient returns to clinic with wounds on his bilateral feet with underlying osteomyelitis biopsy-proven Electronic Signature(s) Signed: 07/20/2022 9:04:42 AM By: Duanne Guess MD FACS Entered By: Duanne Guess on 07/20/2022 09:04:42 -------------------------------------------------------------------------------- Debridement Details Patient Name: Date of Service: [name], [name] 07/20/2022 8:15 A M Medical Record Number: [id number] Patient Account Number: [id number] Date of Birth/Sex: Treating RN: Jan 16, 1986 (36 y.o. [name] Primary Care Provider: [name] Other Clinician: Referring Provider: Treating Provider/Extender: [name] in Treatment: 142 Debridement Performed for Assessment: Wound #3 Left,Lateral,Plantar Foot Performed By: Physician Duanne Guess, MD Debridement Type: Debridement Severity of Tissue Pre Debridement: Fat layer exposed Level of Consciousness (Pre-procedure): Awake and Alert Pre-procedure Verification/Time Out Yes - 08:50 Taken: Start Time: 08:51 T Area Debrided (L x W): otal 3 (cm) x 3.5 (cm) = 10.5 (cm) Tissue and other material debrided: Non-Viable, Callus, Skin: Epidermis,  Biofilm Level: Skin/Epidermis Debridement Description: Selective/Open Wound Instrument: Curette, Forceps, Scissors Bleeding: Minimum Hemostasis Achieved: Pressure Procedural Pain: 0 Post Procedural Pain: 0 Response to Treatment: Procedure was tolerated well Level of Consciousness (Post- Awake and Alert procedure): Post Debridement Measurements of Total Wound Length: (cm) 3.4 Width: (cm) 3 Depth: (cm) 1.3 Volume: (cm) 10.414 Character of Wound/Ulcer Post Debridement: Improved Severity of Tissue Post Debridement: Fat layer exposed Post Procedure Diagnosis Same as Pre-procedure Notes Scribed for Dr. Lady Gary by Zenaida Deed, RN Electronic Signature(s) Signed: 07/20/2022 1:04:04 PM By: Duanne Guess MD FACS Signed: 07/20/2022 5:05:24 PM By: Zenaida Deed RN, BSN [place], [name] [phone] By: Zenaida Deed RN, BSN [phone].pdf Page 2 of 22 Signed: 07/20/2022 5:05:24 Entered By: Zenaida Deed on 07/20/2022 08:57:37 -------------------------------------------------------------------------------- HPI Details Patient Name: Date of Service: [name] D 07/20/2022 8:15 A M Medical Record Number: [id number] Patient Account Number: [id number] Date of Birth/Sex: Treating RN: 1985-06-23 (36 y.o. 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 He comes in for review of a wound over the plantar fifth metatarsal head extending into the lateral part of the foot. He was followed for this previously by his podiatrist Dr. Darreld Mclean. As the patient tells his story he went to see podiatry first for a swelling he developed on the lateral part of his fifth metatarsal head in May. He  states this was "open" by podiatry and the area closed. He was followed up in June and it was again opened callus removed and it closed promptly. There were plans being made for surgery on the fifth metatarsal head in June however his blood sugar was apparently too high for anesthesia. Apparently the area was debrided and opened again in June and it is never closed since. Looking over the records from podiatry I am really  not able to follow this. It was clear when he was first seen it was before 5/14 at that point he already had a wound. By 5/17 the ulcer was resolved. I do not see anything about a procedure. On 5/28 noted to have pre-ulcerative moderate keratosis. X-ray noted 1/5 contracted toe and tailor's bunion and metatarsal deformity. On a visit date on 09/28/2018 the dorsal part of the left foot it healed and resolved. There was concern about swelling in his lower extremity he was sent to the ER.. As far as I can tell he was seen in the ER on 7/12 with an ulcer on his left foot. A DVT rule out of the left leg was negative. I do not think I have complete records from podiatry but I am not able to verify the procedures this patient states he had. He states after the last procedure the wound has never closed although I am not able to follow this in the records I have from podiatry. He has not had a recent x-ray The patient has been using Neosporin on the wound. He is wearing a Darco shoe. He is still very active up on his foot working and exercising. Past medical history; type 2 diabetes ketosis-prone, leg swelling with a negative DVT study in July. Non-smoker ABI in our clinic was 0.85 on the left 10/16; substantial wound on the plantar left fifth met head extending laterally almost to the dorsal fifth MTP. We have been using silver alginate we gave him a Darco forefoot off loader. An x-ray did not show evidence of osteomyelitis did note soft tissue emphysema which I think was due to gas tracking  through an open wound. There is no doubt in my mind he requires an MRI 10/23; MRI not booked until 3 November at the earliest this is largely due to his glucose sensor in the right arm. We have been using silver alginate. There has been an improvement 10/29; I am still not exactly sure when his MRI is booked for. He says it is the third but it is the 10th in epic. This definitely needs to be done. He is running a low-grade fever today but no other symptoms. No real improvement in the 1 02/26/2019 patient presents today for a follow-up visit here in our clinic he is last been seen in the clinic on October 29. Subsequently we were working on getting MRI to evaluate and see what exactly was going on and where we would need to go from the standpoint of whether or not he had osteomyelitis and again what treatments were going be required. Subsequently the patient ended up being admitted to the hospital on 02/07/2019 and was discharged on 02/14/2019. This is a somewhat interesting admission with a discharge diagnosis of pneumonia due to COVID-19 although he was positive for COVID-19 when tested at the urgent care but negative x2 when he was actually in the hospital. With that being said he did have acute respiratory failure with hypoxia and it was noted he also have a left foot ulceration with osteomyelitis. With that being said he did require oxygen for his pneumonia and I level 4 L. He was placed on antivirals and steroids for the COVID-19. He was also transferred to the [facility] at one point. Nonetheless he did subsequently discharged home and since being home has done much better in that regard. The CT angiogram did not show any pulmonary embolism. With regard to the osteomyelitis the patient was placed on vancomycin  and Zosyn while in the hospital but has been changed to Augmentin at discharge. It was also recommended that he follow- up with wound care and podiatry. Podiatry however wanted him  to see [name] according to the patient prior to them doing anything further. His hemoglobin A1c was 9.9 as noted in the hospital. Have an MRI of the left foot performed while in the hospital on 02/04/2019. This showed evidence of septic arthritis at the fifth MTP joint and osteomyelitis involving the fifth metatarsal head and proximal phalanx. There is an overlying plantar open wound noted an abscess tracking back along the lateral aspect of the fifth metatarsal shaft. There is otherwise diffuse cellulitis and mild fasciitis without findings of polymyositis. The patient did have recently pneumonia secondary to COVID-19 I looked in the chart through epic and it does appear that the patient may need to have an additional x-ray just to ensure everything is cleared and that he has no airspace disease prior to putting him into the chamber. 03/05/2019; patient was readmitted to the clinic last week. He was hospitalized twice for a viral upper respiratory tract infection from 11/1 through 11/4 and then 11/5 through 11/12 ultimately this turned out to be Covid pneumonitis. Although he was discharged on oxygen he is not using it. He says he feels fine. He has no exercise limitation no cough no sputum. His O2 sat in our clinic today was 100% on room air. He did manage to have his MRI which showed septic arthritis at the fifth MTP joint and osteomyelitis involving the fifth metatarsal head and proximal phalanx. He received Vanco and Zosyn in the hospital and then was discharged on 2 weeks of Augmentin. I do not see any relevant cultures. He was supposed to follow-up with infectious disease but I do not see that he has an appointment. 12/8; patient saw Dr. Ronnie Derby of infectious disease last week. He felt that he had had adequate antibiotic therapy. He did not go to follow-up with Dr. Logan Bores of podiatry and I have again talked to him about the pros and cons of this. He does not want to consider a ray amputation of this  time. He is aware of the risks of recurrence, migration etc. He started HBO today and tolerated this well. He can complete the Augmentin that I gave him last week. I have looked over the lab work that Dr. Effie Shy ordered his C-reactive protein was 3.3 and his sedimentation rate was 17. The C-reactive protein is never really been measurably that high in this patient 12/15; not much change in the wound today however he has undermining along the lateral part of the foot again more extensively than last week. He has some rims of epithelialization. We have been using silver alginate. He is undergoing hyperbarics but did not dive today 12/18; in for his obligatory first total contact cast change. Unfortunately there was pus coming from the undermining area around his fifth metatarsal head. This was cultured but will preclude reapplication of a cast. He is seen in conjunction with HBO 12/24; patient had staph lugdunensis in the wound in the undermining area laterally last time. We put him on doxycycline which should have covered this. The wound looks better today. I am going to give him another week of doxycycline before reattempting the total contact cast 12/31; the patient is completing antibiotics. Hemorrhagic debris in the distal part of the wound with some undermining distally. He also had hyper granulation. Extensive debridement with a #5 curette. The  infected area that was on the lateral part of the fifth met head is closed over. I do not think he needs any more antibiotics. Patient was seen prior to HBO. Preparations for a total contact cast were made in the cast will be placed post hyperbarics 04/11/19; once again the patient arrives today without complaint. He had been in a cast all week noted that he had heavy drainage this week. This resulted in large raised areas of macerated tissue around the wound 1/14; wound bed looks better slightly smaller. Hydrofera Blue has been changing himself. He had a  heavy drainage last week which caused a lot of maceration [place], [name] ([id number]) [phone].pdf Page 3 of 22 around the wound so I took him out of a total contact cast he says the drainage is actually better this week He is seen today in conjunction with HBO 1/21; returns to clinic. He was up in [place] for a day or 2 attending a funeral. He comes back in with the wound larger and with a large area of exposed bone. He had osteomyelitis and septic arthritis of the fifth left metatarsal head while he was in hospital. He received IV antibiotics in the hospital for a prolonged period of time then 3 weeks of Augmentin. Subsequently I gave him 2 weeks of doxycycline for more superficial wound infection. When I saw this last week the wound was smaller the surface of the wound looks satisfactory. 1/28; patient missed hyperbarics today. Bone biopsy I did last time showed Enterococcus faecalis and Staphylococcus lugdunensis . He has a wide area of exposed bone. We are going to use silver alginate as of today. I had another ethical discussion with the patient. This would be recurrent osteomyelitis he is already received IV antibiotics. In this situation I think the likelihood of healing this is low. Therefore I have recommended a ray amputation and with the patient's agreement I have referred him to Dr. Victorino Dike. The other issue is that his compliance with hyperbarics has been minimal because of his work schedule and given his underlying decision I am going to stop this today READMISSION 10/24/2019 MRI 09/29/2019 left foot IMPRESSION: 1. Apparent skin ulceration inferior and lateral to the 5th metatarsal base with underlying heterogeneous T2 signal and enhancement in the subcutaneous fat. Small peripherally enhancing fluid collections along the plantar and lateral aspects of the 5th metatarsal base suspicious for abscesses. 2. Interval amputation through the mid 5th metatarsal  with nonspecific low-level marrow edema and enhancement. Given the proximity to the adjacent soft tissue inflammatory changes, osteomyelitis cannot be excluded. 3. The additional bones appear unremarkable. MRI 09/29/2019 right foot IMPRESSION: 1. Soft tissue ulceration lateral to the 5th MTP joint. There is low-level T2 hyperintensity within the 4th and 5th metatarsal heads and adjacent proximal phalanges without abnormal T1 signal or cortical destruction. These findings are nonspecific and could be seen with early marrow edema, hyperemia or early osteomyelitis. No evidence of septic joint. 2. Mild tenosynovitis and synovial enhancement associated with the extensor digitorum tendons at the level of the midfoot. 3. Diffuse low-level muscular T2 hyperintensity and enhancement, most consistent with diabetic myopathy. LEFT FOOT BONE Methicillin resistant staphylococcus aureus Staphylococcus lugdunensis MIC MIC CIPROFLOXACIN >=8 RESISTANT Resistant <=0.5 SENSI... Sensitive CLINDAMYCIN <=0.25 SENS... Sensitive >=8 RESISTANT Resistant ERYTHROMYCIN >=8 RESISTANT Resistant >=8 RESISTANT Resistant GENTAMICIN <=0.5 SENSI... Sensitive <=0.5 SENSI... Sensitive Inducible Clindamycin NEGATIVE Sensitive NEGATIVE Sensitive OXACILLIN >=4 RESISTANT Resistant 2 SENSITIVE Sensitive RIFAMPIN <=0.5 SENSI... Sensitive <=0.5 SENSI... Sensitive TETRACYCLINE <=1 SENSITIVE Sensitive <=  1 SENSITIVE Sensitive TRIMETH/SULFA <=10 SENSIT Sensitive <=10 SENSIT Sensitive ... Marland Kitchen.. VANCOMYCIN 1 SENSITIVE Sensitive <=0.5 SENSI... Sensitive Right foot bone . Component 3 wk ago Specimen Description BONE Special Requests RIGHT 4 METATARSAL SAMPLE B Gram Stain NO WBC SEEN NO ORGANISMS SEEN Culture RARE METHICILLIN RESISTANT STAPHYLOCOCCUS AUREUS NO ANAEROBES ISOLATED Performed at [facility], [address] [place], [place] [address] Report Status 10/08/2019 FINAL Organism ID, Bacteria METHICILLIN RESISTANT  STAPHYLOCOCCUS AUREUS Resulting Agency [facility] Susceptibility Methicillin resistant staphylococcus aureus MIC CIPROFLOXACIN >=8 RESISTANT Resistant CLINDAMYCIN <=0.25 SENS... Sensitive ERYTHROMYCIN >=8 RESISTANT Resistant GENTAMICIN <=0.5 SENSI... Sensitive Inducible Clindamycin NEGATIVE Sensitive OXACILLIN >=4 RESISTANT Resistant RIFAMPIN <=0.5 SENSI... Sensitive TETRACYCLINE <=1 SENSITIVE Sensitive TRIMETH/SULFA <=10 SENSIT Sensitive ... VANCOMYCIN 1 SENSITIVE Sensitive This is a patient we had in clinic earlier this year with a wound over his left fifth metatarsal head. 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 Victorino Dike on 05/16/2019. He seems to have developed problems with wounds on his bilateral feet in June 2021 although he may have had problems earlier than this. He was in an urgent care with a right foot ulcer on 09/26/2019 and given a course of doxycycline. This was apparently after having trouble getting into see orthopedics. He was seen by podiatry on 09/28/2019 noted to have bilateral lower extremity ulcers including the left lateral fifth metatarsal base and the right subfifth met head. It was noted that had purulent drainage at that time. He required hospitalization from 6/20 through 7/2. This was because of worsening right foot wounds. He underwent bilateral operative incision and drainage and bone biopsies bilaterally. Culture results are listed above. He has been referred back to clinic by Dr. Ardelle Anton of podiatry. He is also followed by Dr. Orvan Falconer who saw him yesterday. He was discharged from hospital on Zyvox  Flagyl and Levaquin and yesterday changed to doxycycline Flagyl and Levaquin. His inflammatory markers on 6/26 showed a sedimentation rate of 129 and a C-reactive protein of 5. This is improved to 14 and 1.3 respectively. This would indicate improvement. ABIs in our clinic today were 1.23 on the right and 1.20 on the left 11/01/2019 on evaluation today patient appears to be doing fairly well in regard to the wounds on his feet at this point. Fortunately there is no signs of active infection at this time. No fevers, chills, nausea, vomiting, or diarrhea. He currently is seeing infectious disease and still under their care at this point. Subsequently he also has both wounds which she has not been using collagen on as he did not receive that in his packaging he did not call us and let us know that. Apparently that just was missed on the order. Nonetheless we will get that straightened out today. 8/9-Patient returns for bilateral foot wounds, using Prisma with hydrogel moistened dressings, and the wounds appear stable. Patient using surgical shoes, avoiding much pressure or weightbearing as much as possible 8/16; patient has bilateral foot wounds. 1 on the right lateral foot proximally the other is on the left mid lateral foot. Both required debridement of callus and thick skin around the wounds. We have been using silver collagen 8/27; patient has bilateral lateral foot wounds. The  area on the left substantially surrounded by callus and dry skin. This was removed from the wound edge. The underlying wound is small. The area on the right measured somewhat smaller today. We've been using silver collagen the patient was on antibiotics for underlying osteomyelitis in the left foot. Unfortunately I did not update his antibiotics during today's visit. 9/10 I reviewed Dr. Blair Dolphin last notes he felt he had completed antibiotics his inflammatory markers were reasonably well controlled. He has a small wound on  the lateral left foot and a tiny area on the right which is just above closed. He is using Hydrofera Blue with border foam he has bilateral surgical shoes 9/24; 2 week f/u. doing well. right foot is closed. left foot still undermined. 10/14; right foot remains closed at the fifth met head. The area over the base of the left fifth metatarsal has a small open area but considerable undermining towards the plantar foot. Thick callus skin around this suggests an adequate pressure relief. We have talked about this. He says he is going to go back into his cam boot. I suggested a total contact cast he did not seem enamored with this suggestion 10/26; left foot base of the fifth metatarsal. Same condition as last time. He has skin over the area with an open wound however the skin is not adherent. He went to see Dr. Loreta Ave who did an x-ray and culture of his foot I have not reviewed the x-ray but the patient was not told anything. He is on doxycycline 11/11; since the patient was last here he was in the emergency room on 10/30 he was concerned about swelling in the left foot. They did not do any cultures or x-rays. They changed his antibiotics to cephalexin. Previous culture showed group B strep. The cephalexin is appropriate as doxycycline has less than predictable coverage. Arrives in clinic today with swelling over this area under the wound. He also has a new wound on the right fifth metatarsal head 11/18; the patient has a difficult wound on the lateral aspect of the left fifth metatarsal head. The wound was almost ballotable last week I opened it slightly expecting to see purulence however there was just bleeding. I cultured this this was negative. X-ray unchanged. We are trying to get an MRI but I am not sure were going to be able to get this through his insurance. He also has an area on the right lateral fifth metatarsal head this looks healthier 12/3; the patient finally got our MRI. Surprisingly this did  not show osteomyelitis. I did show the soft tissue ulceration at the lateral plantar aspect of the fifth metatarsal base with a tiny residual 6 mm abscess overlying the superficial fascia I have tried to culture this area I have not been able to get this to grow anything. Nevertheless the protruding tissue looks aggravated. I suspect we should try to treat the underlying "abscess with broad-spectrum antibiotics. I am going to start him on Levaquin and Flagyl. He has much less edema in his legs and I am going to continue to wrap his legs and see him weekly 12/10. I started Levaquin and Flagyl on him last week. He just picked up the Flagyl apparently there was some delay. The worry is the wound on the left fifth metatarsal base which is substantial and worsening. His foot looks like he inverts at the ankle making this a weightbearing surface. Certainly no improvement in fact I think the measurements of this are somewhat  worse. We have been using 12/17; he apparently just got the Levaquin yesterday this is 2 weeks after the fact. He has completed the Flagyl. The area over the left fifth metatarsal base still has protruding granulation tissue although it does not look quite as bad as it did some weeks ago. He has severe bilateral lymphedema although we have not been treating him for wounds on his legs this is definitely going to require compression. There was so much edema in the left I did not wish to put him in a total contact cast today. I am going to increase his compression from 3-4 layer. The area on the right lateral fifth met head actually look quite good and superficial. 12/23; patient arrived with callus on the right fifth met head and the substantial hyper granulated callused wound on the base of his fifth metatarsal. He says he is completing his Levaquin in 2 days but I do not think that adds up with what I gave him but I will have to double check this. We are using Hydrofera Blue on both  areas. My plan is to put the left leg in a cast the week after New Year's 04/06/2020; patient's wounds about the same. Right lateral fifth metatarsal head and left lateral foot over the base of the fifth metatarsal. There is undermining on the left lateral foot which I removed before application of total contact cast continuing with Hydrofera Blue new. Patient tells me he was seen by endocrinology today lab work was done [Dr. Kerr]. Also wondering whether he was referred to cardiology. I went over some lab work from previously does not have chronic renal failure certainly not nephrotic range proteinuria he does have very poorly controlled diabetes but this is not his most updated lab work. Hemoglobin A1c has been over 11 1/10; the patient had a considerable amount of leakage towards mid part of his left foot with macerated skin however the wound surface looks better the area on the right lateral fifth met head is better as well. I am going to change the dressing on the left foot under the total contact cast to silver alginate, continue with Hydrofera Blue on the right. 1/20; patient was in the total contact cast for 10 days. Considerable amount of drainage although the skin around the wound does not look too bad on the left foot. The area on the right fifth metatarsal head is closed. Our nursing staff reports large amount of drainage out of the left lateral foot wound 1/25; continues with copious amounts of drainage described by our intake staff. PCR culture I did last week showed E. coli and Enterococcus faecalis and low quantities. Multiple resistance genes documented including extended spectrum beta lactamase, MRSA, MRSE, quinolone, tetracycline. The wound is not quite as good this week as it was 5 days ago but about the same size 2/3; continues with copious amounts of malodorous drainage per our intake nurse. The PCR culture I did 2 weeks ago showed E. coli and low quantities of Enterococcus. There  were multiple resistance genes detected. I put Neosporin on him last week although this does not seem to have helped. The wound is slightly deeper today. Offloading continues to be an issue here although with the amount of drainage she has a total contact cast is just not going to work 2/10; moderate amount of drainage. Patient reports he cannot get his stocking on over the dressing. I told him we have to do that the nurse gave him suggestions  on how to make this work. The wound is on the bottom and lateral part of his left foot. Is cultured predominantly grew low amounts of Enterococcus, E. coli and anaerobes. There were multiple resistance genes detected including extended spectrum beta lactamase, quinolone, tetracycline. I could not think of an easy oral combination to address this so for now I am going to do topical antibiotics provided by [facility] I think the main agents here are vancomycin and an aminoglycoside. We have to be able to give him access to the wounds to get the topical antibiotic on 2/17; moderate amount of drainage this is unchanged. He has his Keystone topical antibiotic against the deep tissue culture organisms. He has been using this and changing the dressing daily. Silver alginate on the wound surface. 2/24; using Keystone antibiotic with silver alginate on the top. He had too much drainage for a total contact cast at one point although I think that is improving and I think in the next week or 2 it might be possible to replace a total contact cast I did not do this today. In general the wound surface looks healthy however he continues to have thick rims of skin and subcutaneous tissue around the wide area of the circumference which I debrided 06/04/2020 upon evaluation today patient appears to be doing well in regard to his wound. I do feel like he is showing signs of improvement. There is little bit of callus and dead tissue around the edges of the wound as well as what appears to  be a little bit of a sinus tract that is off to the side laterally I would perform debridement to clear that away today. 3/17; left lateral foot. The wound looks about the same as I remember. Not much depth surface looks healthy. No evidence of infection 3/25; left lateral foot. Wound surface looks about the same. Separating epithelium from the circumference. There really is no evidence of infection here however not making progress by my view [place], [name] ([id number]) [id number] Page 5 of 22 3/29; left lateral foot. Surface of the wound again looks reasonably healthy still thick skin and subcutaneous tissue around the wound margins. There is no evidence of infection. One of the concerns being brought up by the nurses has again the amount of drainage vis--vis continued use of a total contact cast 4/5; left lateral foot at roughly the base of the fifth metatarsal. Nice healthy looking granulated tissue with rims of epithelialization. The overall wound measurements are not any better but the tissue looks healthy. The only concern is the amount of drainage although he has no surrounding maceration with what we have been doing recently to absorb fluid and protect his skin. He also has lymphedema. 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 He states that the kerlix/coban under the TCC rolled down and could not get it back up. He has been using an offloading scooter and has somehow hurt his right foot using this device. This happened last week. He states that the side of his right foot developed a blister and opened. The top of his foot also has a few small open wounds he thinks is due to his socks rubbing in his shoes. He has not  been  using any dressings to the wound. He denies purulent drainage, fever/chills or erythema to the wounds. 4/22; patient presents for 1 week follow-up. He developed new wounds to the right foot that were evaluated at last clinic visit. He continues to have a total contact cast to the left leg and he reports no issues. He has been using silver collagen to the right foot wounds with no issues. He denies purulent drainage, fever/chills or erythema to the right foot wounds. He has no complaints today 4/25; patient presents for 1 week follow-up. He has a total contact cast of the left leg and reports no issues. He has been using silver alginate to the right foot wound. He denies purulent drainage, fever/chills or erythema to the right foot wounds. 5/2 patient presents for 1 week follow-up. T contact cast on the left. The wound which is on the base of the plantar foot at the base of the fifth metatarsal otal actually looks quite good and dimensions continue to gradually contract. HOWEVER the area on the right lateral fifth metatarsal head is much larger than what I remember from 2 weeks ago. Once more is he has significant levels of hypergranulation. Noteworthy that he had this same hyper granulated response on his wound on the left foot at one point in time. So much so that he I thought there was an underlying fluid collection. Based on this I think this just needs debridement. 5/9; the wound on the left actually continues to be gradually smaller with a healthy surface. Slight amount of drainage and maceration of the skin around but not too bad. However he has a large wound over the right fifth metatarsal head very much in the same configuration as his left foot wound was initially. I used silver nitrate to address the hyper granulated tissue no mechanical debridement 5/16; area on the left foot did not look as healthy this week deeper thick surrounding macerated skin and subcutaneous tissue. The area  on the right foot fifth met head was about the same The area on the right ankle that we identified last week is completely broken down into an open wound presumably a stocking rubbing issue 5/23; patient has been using a total contact cast to the left side. He has been using silver alginate underneath. He has also been using silver alginate to the right foot wounds. He has no complaints today. He denies any signs of infection. 5/31; the left-sided wound looks some better measure smaller surface granulation looks better. We have been using silver alginate under the total contact cast The large area on his right fifth met head and right dorsal foot look about the same still using silver alginate 6/6; neither side is good as I was hoping although the surface area dimensions are better. A lot of maceration on his left and right foot around the wound edge. Area on the dorsal right foot looks better. He says he was traveling. I am not sure what does the amount of maceration around the plantar wounds may be drainage issues 6/13; in general the wound surfaces look quite good on both sides. Macerated skin and raised edges around the wound required debridement although in general especially on the left the surface area seems improved. The area on the right dorsal ankle is about the same I thought this would not be such a problem to close 6/20; not much change in either wound although the one on the right looks a little better. Both wounds have thick macerated edges  to the skin requiring debridements. We have been using silver alginate. The area on the dorsal right ankle is still open I thought this would be closed. 6/28; patient comes in today with a marked deterioration in the right foot wound fifth met head. Wide area of exposed bone this is a drastic change from last time. The area on the left there we have been casting is stagnant. We have been using silver alginate in both wound areas. 7/5; bone culture I  did for PCR last time was positive for Pseudomonas, group B strep, Enterococcus and Staph aureus. There was no suggestion of methicillin resistance or ampicillin resistant genes. This was resistant to tetracycline however He comes into the clinic today with the area over his right plantar fifth metatarsal head which had been doing so well 2 weeks ago completely necrotic feeling bone. I do not know that this is going to be salvageable. The left foot wound is certainly no smaller but it has a better surface and is superficial. 7/8; patient called in this morning to say that his total contact cast was rubbing against his foot. He states he is doing fine overall. He denies signs of infection. 7/12; continued deterioration in the wound over the right fifth metatarsal head crumbling bone. This is not going to be salvageable. The patient agrees and wants to be referred to Dr. Victorino Dike which we will attempt to arrange as soon as possible. I am going to continue him on antibiotics as long as that takes so I will renew those today. The area on the left foot which is the base of the fifth metatarsal continues to look somewhat better. Healthy looking tissue no depth no debridement is necessary here. 7/20; the patient was kindly seen by Dr. Victorino Dike of orthopedics on 10/19/2020. 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 Towards this end we have taken him out of the total contact cast on the left we will put him in a wrap with Hydrofera Blue. As I understand things surgery is planned for 7/21 7/27; patient had his surgery last Thursday. 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 Apparently everything went well we did not still disturb that today The area on the left foot actually looks quite good. He has been much less mobile which probably explains this he did not seem to do well in the total contact cast secondary to drainage  and maceration I think. 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 Fortunately there is no evidence of active infection at this time. No fevers, chills, nausea, vomiting, or diarrhea. Overall I think that he is actually doing extremely well. Nonetheless I do believe that he is staying off of this more following the surgery in his right foot that is the reason the left is doing so great. 8/16; left plantar foot wound. This looks smaller than the last time I saw this he is using Hydrofera Blue. The surgical wound on the right foot is being followed by Dr. Victorino Dike we did not look at this today. He has surgical shoes on both feet 8/23; left plantar foot wound not as good this week. Surrounding macerated skin and subcutaneous tissue everything looks moist and wet. I do not think he is offloading this adequately. He is using a  surgical shoe Apparently the right foot surgical wound is not open although I did not check his foot 8/31; left plantar foot lateral aspect. Much improved this week. He has no maceration. Some improvement in the surface area of the wound but most impressively the depth is come in we are using silver alginate. The patient is a Veterinary surgeon. He is asked that we write him a letter so he can go back to work. I have also tried to see if we can write something that will allow him to limit the amount of time that he is on his foot at work. 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 Hopefully the latter can be avoided 9/6; I believe the patient missed an appointment last week. He arrives in today with a wound looking roughly the same certainly no better. Undermining laterally [place], [name] ([id number]) [id number] Page 6 of 22 and also inferiorly. We used molecuLight  today in training with the patient's permission.. We are using silver alginate 9/21 wound is measuring bigger this week although this may have to do with the aggressive circumferential debridement last week in response to the blush fluorescence on the MolecuLight. Culture I did last week showed significant MSSA and E. coli. I put him on Augmentin but he has not started it yet. We are also going to send this for compounded antibiotics at [facility]. There is no evidence of systemic infection 9/29; silver alginate. His Keystone arrived. He is completing Augmentin in 2 days. Offloading in a cam boot. Moderate drainage per our intake staff 10/5; using silver alginate. He has been using his [place]. He has completed his Augmentin. Per our intake nurse still a lot of drainage, far too much to consider a total contact cast. Wound measures about the same. He had the same undermining area that I defined last week from a roughly 11-3. I remove this today 10/12; using silver alginate he is using the [place]. He comes in for a nurse visit hence we are applying Jodie Echevaria twice a week. Measuring slightly better today and less notable drainage. Extensive debridement of the wound edge last time 10/18; using topical Keystone and silver alginate and a soft cast. Wound measurements about the same. Drainage was through his soft cast. We are changing this twice a week Tuesdays and Friday 10/25; comes in with moderate drainage. Still using Keystone silver alginate and a soft cast. Wound dimensions completely the same.He has a lot of edema in the left leg he has lymphedema. Asking for [name] to consider wrapping him as he cannot get his stocking on over the soft cast 11/2; comes in with moderate to large drainage slightly smaller in terms of width we have been using [place]. His wound looks satisfactory but not much improvement 11/4; patient presents today for obligatory cast change. Has no issues or complaints today. He  denies signs of infection. 11/9; patient traveled this weekend to [place], was on the cast quite a bit. Staining of the cast with black material from his walking boot. Drainage was not quite as bad as we feared. Using silver alginate and Keystone 11/16; we do not have size for cast therefore we have been putting a soft cast on him since the change on Friday. Still a significant amount of drainage necessitating changing twice a week. We have been using the [facility] at cast changes either hard or soft as well as silver alginate  Comes in the clinic with things actually looking fairly good improvement in width. He says his offloading is about the same 02/24/2021 upon evaluation today patient actually comes back in and is doing excellent in regard to his foot ulcer this is significantly smaller even compared to the last visit. The soft cast seems to have done extremely well for him which is great news. I do not see any signs of infection minimal debridement will be needed today. 11/30; left lateral foot much improved half a centimeter improvement in surface area. No evidence of infection. He seems to be doing better with the soft cast in the TCC therefore we will continue with this. He comes back in later in the week for a change with the nurses. This is due to drainage 12/6; no improvement in dimensions. Under illumination some debris on the surface we have been using silver alginate, soft cast. If there is anything optimistic here he seems to have have less drainage 12/13. Dimensions are improved both length and width and slightly in depth. Appears to be quite healthy today. Raised edges of this thick skin and callus around the edges however. He is in a soft cast were bringing him back once for a change on Friday. Drainage is better 12/20. Dimensions are improved. He still has raised edges of thick skin and callus around the edges. We are using a soft cast 12/28; comes in today with thick callus around the  wound. Using silver under alginate under a soft cast. I do not think there is much improvement in any measurement 2023 04/06/2021; patient was put in a total contact cast. Unfortunately not much change in surface area 1/10; not much different still thick callus and skin around the edge in spite of the total contact cast. This was just debrided last week we have been using the [facility] compounded antibiotic and silver alginate under a total contact cast 1/18 the patient's wound on the left side is doing nicely. smaller HOWEVER he comes in today with a wound on the right foot laterally. blister most likely serosangquenous drainage 1/24; the patient continues to do well in terms of the plantar left foot which is continued to contract using silver alginate under the total contact cast HOWEVER the right lateral foot is bigger with denuded skin around the edges. I used pickups and a #15 scalpel to remove this this looks like the remanence of a large blister. Cannot rule out infection. Culture in this area I did last week showed Staphylococcus lugdunensis few colonies. I am going to try to address this with his Jodie Echevaria antibiotic that is done so well on the left having linezolid and this should cover the staph 2/1; the patient's wound on his left foot which was the original plantar foot wound thick skin and eschar around the edges even in the total contact cast but the wound surface does not look too bad The real problem is on how his right lateral foot at roughly the base of the fifth metatarsal. The wound is completely necrotic more worrisome than that there is swelling around the edges of this. We have been using silver alginate on both wounds and Keystone on the right foot. Unfortunately I think he is going to require systemic antibiotics while we await cultures. He did not get the x-ray done that we ordered last week [lost the prescription 2/7; disappointingly in the area on the left foot which we are  treating with a total contact cast is still not closed  although it is much smaller. He continues to have a lot of callus around the wound edge. -Right lateral foot culture I did last week was negative x-ray also negative for osteomyelitis. 2/15: TCC silver alginate on the left and silver alginate on the right lateral. No real improvement in either area 05/26/2021: T oday, the wounds are roughly the same size as at his previous visit, post-debridement. He continues to endorse fairly substantial drainage, particularly on the right. He has been in a total contact cast on the left. There is still some callus surrounding this lesion. On the right, the periwound skin is quite macerated, along with surrounding callus. The center of the right-sided wound also has some dark, densely adherent material, which is very difficult to remove. 06/02/2021: Today, both wounds are slightly smaller. He has been using zinc oxide ointment around the right ulcer and the degree of maceration has improved markedly. There continues to be an area of nonviable tissue in the center of the right sided ulcer. The left-sided wound, which has been in the total contact cast. Appears clean and the degree of callus around it is less than previously. 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 He had to take the stairs and both wounds have increased in size. The left foot, which has been in a total contact cast, has developed a tunnel tracking to the lateral aspect of his foot. The nonviable tissue in the center of the right-sided ulcer remains recalcitrant to debridement. There is significant undermining surrounding the entirety of the left sided wound. 06/16/2021: The elevator at school has been fixed and the patient has been able to avoid putting as much weight on his wounds over the past week. We converted the left foot wound into a single lesion today, but despite this, the  wound is actually smaller. The base is healthy with limited periwound callus. On the right, the central necrotic area is still present. He continues to be quite macerated around the right sided wound, despite applying barrier cream. This does, however, have the benefit of softening the callus to make it more easily removable. 06/23/2021: Today, the left wound is smaller. The lateral aspect that had opened up previously is now closed. The wound base has a healthy bed of granulation tissue and minimal slough. Unfortunately, on the right, the wound is larger and continues to be fairly macerated. He has also reopened the wound at his right ankle. He thinks this is due to the gait he has adopted secondary to his total contact cast and boot. 06/30/2021: T oday, both wounds are a little bit larger. The lateral aspect on the left has remained closed. He continues to have significant periwound maceration. The culture that I took from the right sided wound grew a population of bacteria that is not covered by his current [facility] antibiotic. The center of the right- sided wound continues to appear necrotic with nonviable fat. It probes deeper today, but does not reach bone. [place], [name] ([id number]) [id number] Page 7 of 22 07/07/2021: The periwound maceration is a little bit less today. The right lateral foot wound has some areas that appear more viable and the necrotic center also looks a little bit better. The wound on the dorsal surface of his right foot near the ankle is contracting and the surface appears healthy. The left plantar wound surface looks healthy, but there is some new undermining on the medial portion. He did get his  new Keystone antibiotic and began applying that to the right foot wound on Saturday. 07/14/2021: The intake nurse reported substantial drainage from his wounds, but the periwound skin actually looks better than is typical for him. The wound on the dorsal  surface of his right foot near the ankle is smaller and just has a small open area underneath some dried eschar. The left plantar wound surface looks healthy and there has been no significant accumulation of callus. The right lateral foot wound looks quite a bit better, with the central portion, which typically appears necrotic, looking more viable albeit pale. 07/22/2021: His left foot is extremely macerated today. The wound is about the same size. The wound on the dorsal surface of his right foot near the ankle had closed, but he traumatized it removing the dressing and there is a tiny skin tear in that location. The right lateral foot wound is bigger, but the surface appears healthy. 07/30/2021: The wound on the dorsal surface of his right foot near the ankle is closed. The right lateral foot wound again is a little bit bigger due to some undermining. The periwound skin is in better condition, however. He has been applying zinc oxide. The wound surface is a little bit dry today. On the left, he does not have the substantial maceration that we frequently see. The wound itself is smaller and has a clean surface. 08/06/2021: Both wounds seem to have deteriorated over the past week. The right lateral foot wound has a dry surface but the periwound is boggy.. Overall wound dimensions are about the same. On the left, the wound is about the same size, but there is more undermining present underneath periwound callus. 08/13/2021: The right sided wound looks about the same, but on the left there has been substantial deterioration. The undermining continues to extend under periwound callus. Once this was removed, substantial extension of the wound was present. There is no odor or purulent drainage but clearly the wounds have broken down. 08/20/2021: The wounds look about the same today. He has been out of his total contact cast and has just been changing the dressings using topical Keystone with PolyMem Ag, Kerlix  and Ace bandages. The wound on the top of his right ankle has reopened but this is quite small. There was a little bit of purulent material that I expressed when examining this wound. 08/24/2021: After the aggressive debridement I performed at his last visit, the wounds actually look a little bit better today. They are smaller with the exception of the wound on the top of his right ankle which is a little bit bigger as some more skin pulled off when he was changing his dressing. We are using topical Keystone with PolyMem Ag Kerlix and Ace bandages. 09/02/2021: There has been really no change to any of his wounds. 09/16/2021: The patient was hospitalized last week with nausea, vomiting, and dehydration. He says that while he was in the hospital, his wounds were not really addressed properly. T oday, both plantar foot wounds are larger and the periwound skin is macerated. The wound on the dorsum of his right foot has a scab on the top. The right foot now has a crater where previously he had had nonviable fat. It looks as though this simply died and fell out. The periwound callus is wet. 09/24/2021: His wounds have deteriorated somewhat since his last visit. The wound on the dorsum of his right foot near his ankle is larger and has more nonviable tissue  present. The crater in his right foot is even deeper; I cannot quite palpate or probe to bone but I am sure it is close. The wound on his left plantar foot has an odd boggy area in the center that almost feels as though it has fluid within it. He has run out of his topical Keystone antibiotic. We are using silver alginate on his wounds. 09/29/2021: He has developed a new wound on the dorsum of his left foot near his ankle. He says he thinks his wrapping is rubbing in that site. I would concur with this as the wound on his right ankle is larger. The left foot looks about the same. The right foot has the crater that was present last week. No significant slough  accumulation, but his foot remains quite swollen and warm despite oral antibiotic therapy. 10/08/2021: All of his wounds look about the same as last week. He did not start his oral antibiotics that are prescribed until just a couple of days ago; his Jodie Echevaria compounded antibiotics formula has been changed and he is awaiting delivery of the new recipe. His MRI that was scheduled for earlier this week was canceled as no prior authorization had been obtained; unfortunately the tech responsible sent an email to my old [place] email, which I no longer use nor have access to. 10/18/2021: The wounds on his bilateral dorsal feet near the ankles are both improved. They are smaller and have just some eschar and slough buildup. The left plantar wound has a fair amount of undermining, but the surface is clean. There is some periwound callus accumulation. On the right plantar foot, there is nonviable fat leading to a deep tunnel that tracks towards his dorsal medial foot. There is periwound callus and slough accumulation, as well. His right foot and leg remain swollen as compared to the left. 10/25/2021: The wounds on his bilateral dorsal feet and at the ankles have broken down somewhat. They are little bit larger than last week. The left plantar wound continues to undermine laterally but the surface is clean. The right plantar foot wound shows some decreased depth in the tunnel tracking towards his dorsal medial foot. He has not yet had the Doppler study that I ordered; it sounds like there is some confusion about the scheduling of the procedure. In addition, the MRI was denied and I have taken steps to appeal the denial. 11/24/2021: Since our last visit, Mr. Mcmiller was admitted to the hospital where an MRI suggested osteomyelitis. He was taken the operating room by podiatry. Bone biopsies were negative for osteomyelitis. They debrided his wounds and applied myriad matrix. He saw them last week and they removed  his staples. He is here today to continue his wound healing process. T oday, both of the dorsal foot/ankle wounds are substantially smaller. There is just a little eschar overlying the left sided wound and some eschar and slough on the right. The right plantar foot ulcer has the healthiest surface of granulation tissue that I have seen to date. A portion of the myriad matrix failed to take and was hanging loose. It appears that myriad morcells were placed into the tunnel closest to the dorsal portion of his foot. These have sloughed off. The left plantar foot ulcer is about the same size, but has a much healthier surface than in the past. Both plantar ulcers have callus and slough accumulation. 12/07/2021: Left dorsal foot/ankle wound is closed. The right dorsal foot/ankle wound is nearly closed and just  has a small open area with some eschar and slough. The right plantar foot wound has contracted quite a bit since our last visit. It has a healthy surface with just a little bit of slough accumulation and periwound callus buildup. The left plantar foot wound is about the same size but the surface appears healthy. There is a little slough and periwound callus on this side, as well. 12/15/2021: Both dorsal foot/ankle wounds are closed. The right plantar foot wound is substantially smaller than at our last visit. The tunneling that was present has nearly closed. There is just a little bit of slough buildup. The left plantar foot wound is also a little bit smaller today. The surface is the healthiest that I have ever seen it. Light slough and periwound callus accumulation on this side. 12/22/2021: The dorsal ankle/foot wounds remain closed. The right plantar foot wound continues to contract. There is still a bit of depth at the lateral portion of the wound but the surface has a good granulated appearance. The wound on the left is about the same size, the central indented portion is still adherent. It also is  clean with good granulation tissue present. The periwound skin and callus are a bit macerated, however. 12/29/2021: Both ankle wounds remain closed. The right plantar foot wound is less than half the size that it was last week. There is no depth any longer and the surface has just a little bit of slough and periwound callus. On the left, the wound is not much smaller, but the surface is healthier with good granulation tissue. There is also a little slough and periwound callus buildup. 01/05/2022: The right plantar foot wound continues to contract and is once again about half the size as it was last week. There is just a little bit of surface slough and periwound callus. On the left, the depth of the wound has decreased and the diameter has started to contract. It is also very clean with just a little slough and biofilm present. [place], [name] ([id number]) [id number] Page 8 of 22 01/12/2022: The right plantar foot wound is smaller again today. There is just some periwound callus and slough accumulation. On the left, there is a fair amount of undermining and the surface is a little boggy, but no other significant change. 01/19/2022: The right plantar wound continues to contract. There is minimal periwound callus accumulation and just a light layer of slough on the surface. On the left, the surface looks better, but there is fairly significant undermining near the 11:00 portion of the wound, aiming towards his great toe. The skin overlying this area is very healthy and has a good fat layer present. No malodor or purulent drainage. 01/26/2022: The right sided wound continues to contract and has just a light layer of slough on the surface. Minimal periwound callus. On the left, he still has substantial undermining and the tissue surface is not as robust as I would like to see. No malodor or purulent drainage, but he did say he had a lot of serous drainage over the  weekend. 02/02/2022: The right foot wound is about a quarter of the size that it was at his last visit. There is just a little slough on the surface and some periwound callus. On the left, the undermining persists and the tissue is still more pale, but he does not seem to have had as much drainage over the last weekend. We are waiting on a wound VAC. 02/09/2022: His right foot has healed.  He received the wound VAC, but did not bring it to clinic with him today. The lateral aspect of the left foot wound has come in a little, but he still has substantial undermining on the medial and distal portion of the wound. Still with macerated periwound callus. 02/16/2022: The wound VAC was applied last Friday. T oday, there has been tremendous improvement in the surface of the wound bed. The undermined portion of the wound has come in a bit. There is still some overhanging dead skin. Overall the wound looks much better. 02/23/2022: No significant change in the overall wound dimensions, but the wound surface continues to improve and appears healthier and more robust. There is some periwound callus accumulation and overhanging skin. No concern for infection. 03/02/2022: Although the measurements taken in clinic today were unchanged, the visual appearance of the wound looks like it is smaller and the undermining/tunneling is filling with flesh. There is less periwound tissue maceration than usual. No malodor or purulent drainage. No concern for infection. 03/09/2022: The undermining has come in by couple of millimeters. The periwound is a little bit more macerated this week. No concern for infection. 12/13; substantial wound on the left plantar foot. We are using silver collagen and a wound VAC. 03/23/2022: The undermining continues to contract. The wound surface is a little bit dry. 03/30/2022: The undermining has essentially resolved. There is still some depth at the center of the wound. The wound surface has good  beefy tissue and the moisture balance is better. 04/06/2022: The wound dimensions are about the same, except that the depth is beginning to fill in. He has had more drainage this week, but there is no obvious concern for infection. 04/13/2022: He has built up a little bit of callus around the edges of the wound, creating some undermining. Otherwise the wound looks fairly unchanged. 04/20/2022: The wound bed tissue looks very healthy and robust. Light biofilm on the surface. There is some built up wet callus around the wound edges, but no undermining. 04/27/2022: The wound surface continues to look very healthy. Although the wound dimensions measured the same, it looks smaller to me. No real callus accumulation this week. Minimal slough on the surface. 05/04/2022: The wound measured smaller and shallower this week. We are still awaiting insurance approval for Apligraf. 05/11/2022: The wound is about the same in terms of depth but measured a little bit narrower today. He has built up a rim of moist callus around the edges. He has been approved for Apligraf and we will apply that today. 05/18/2022: The wound measured a little bit smaller again today. No significant callus buildup this week. 05/25/2022: The wound was measured about the same size today, but the multiple nooks and crannies in the depths of the wound seem to be filling in. 06/01/2022: The wound dimensions are smaller today, with the exception of the depth. There is a little bit of moist callus around the edges. 06/08/2022: The wound continues to contract aside from the depth. Once again, there is moist callus around the edges. The odor is less profound this week. 3/13; patient presents for follow-up. He has had 5 applications of Apligraf and is currently not approved for more by insurance. He has also had a wound VAC in place. He has no issues or complaints today. 06/22/2022: The depth of the wound has come in somewhat. There is some undermining  created by skin encroaching over the top of the wound. The wound continues to have a pungent odor.  06/29/2022: The wound dimensions are smaller, with the exception of the depth. There is some undermining created by overlying skin and callus. There is still some odor, but it is not as strong as it was on prior occasions. The culture that I took last week returned with a polymicrobial population, Pseudomonas dominant. He is currently taking levofloxacin and a new Keystone prescription should be delivered later this week. 07/06/2022: The depth of the wound has come in by about half a centimeter. He was up on his feet a bit more over the weekend and he says that he has been sliding in his shoe; the result is that he has developed a bit more undermining. The odor from his wound is gone. 07/20/2022: The depth of the wound has come in again this week. There is some undermining of moist callus and skin around the lateral edge of his wound. No odor and the surface of his wound appears robust and healthy. Electronic Signature(s) Signed: 07/20/2022 9:06:14 AM By: Duanne Guess MD FACS Entered By: Duanne Guess on 07/20/2022 09:06:14 -------------------------------------------------------------------------------- Physical Exam Details Patient Name: Date of Service: [name], [name] 07/20/2022 8:15 A M Medical Record Number: [id number] Patient Account Number: [id number] [place], [name] ([id number]) [id number] Page 9 of 22 Date of Birth/Sex: Treating RN: Nov 23, 1985 (36 y.o. [name] Primary Care Provider: Other Clinician: [name] Referring Provider: Treating Provider/Extender: [name] in Treatment: 142 Constitutional . . . . no acute distress. Respiratory Normal work of breathing on room air. Notes 07/20/2022: The depth of the wound has come in again this week. There is some undermining of moist callus and skin around the  lateral edge of his wound. No odor and the surface of his wound appears robust and healthy. Electronic Signature(s) Signed: 07/20/2022 9:06:52 AM By: Duanne Guess MD FACS Entered By: Duanne Guess on 07/20/2022 09:06:52 -------------------------------------------------------------------------------- Physician Orders Details Patient Name: Date of Service: [name], [name] 07/20/2022 8:15 A M Medical Record Number: [id number] Patient Account Number: [id number] Date of Birth/Sex: Treating RN: [birth year] (36 y.o. 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 - Dr. Lady Gary - Room 1 with Bonita Quin Return A Friday 4/24 @ 08:15 am Anesthetic (In clinic) Topical Lidocaine 4% applied to wound bed Bathing/ Shower/ Hygiene May shower and wash wound with soap and water. - with dressing changes Negative Presssure Wound Therapy Wound #3 Left,Lateral,Plantar Foot Wound Vac to wound continuously at 159mm/hg pressure Black Foam Edema Control - Lymphedema / SCD / Other Bilateral Lower Extremities Elevate legs to the level of the heart or above for 30 minutes daily and/or when sitting for 3-4 times a day throughout the day. Avoid standing for long periods of time. Exercise regularly Compression stocking or Garment 20-30 mm/Hg pressure to: - to both legs daily Off-Loading Open toe surgical shoe to: - Both feet, peg assist insert left shoe Additional Orders / Instructions Follow Nutritious Diet Non Wound Condition pply the following to affected area as directed: - continue to apply foam donut or cushion to  healed right plantar foot A Wound Treatment Wound #3 - Foot Wound Laterality: Plantar, Left, Lateral Cleanser: Soap and Water 3 x Per Week/30  Days Discharge Instructions: May shower and wash wound with dial antibacterial soap and water prior to dressing change. [place], [name] ([id number]) [id number] Page 10 of 22 Cleanser: Vashe 5.8 (oz) 3 x Per Week/30 Days Discharge Instructions: Cleanse the wound with Vashe prior to applying a clean dressing, let sit on wound for 5-10 minutesusing gauze sponges, not tissue or cotton balls. Peri-Wound Care: Skin Prep (Generic) 3 x Per Week/30 Days Discharge Instructions: Use skin prep as directed Topical: keystone antibiotic compound 3 x Per Week/30 Days Discharge Instructions: apply to base of wound Prim Dressing: Promogran Prisma Matrix, 4.34 (sq in) (silver collagen) (Dispense As Written) 3 x Per Week/30 Days ary Discharge Instructions: Moisten collagen with saline or hydrogel Prim Dressing: VAC ary 3 x Per Week/30 Days Secondary Dressing: Zetuvit Absorbent Pad, 4x4 (in/in) (Dispense As Written) 3 x Per Week/30 Days Secured With: Elastic Bandage 4 inch (ACE bandage) (Generic) 3 x Per Week/30 Days Discharge Instructions: Secure with ACE bandage as directed. Secured With: 68M Medipore Scientist, research (life sciences) Surgical T 2x10 (in/yd) (Generic) 3 x Per Week/30 Days ape Discharge Instructions: Secure with tape as directed. Compression Wrap: Kerlix Roll 4.5x3.1 (in/yd) (Generic) 3 x Per Week/30 Days Discharge Instructions: Apply Kerlix and Coban compression as directed. Electronic Signature(s) Signed: 07/20/2022 1:04:04 PM By: Duanne Guess MD FACS Entered By: Duanne Guess on 07/20/2022 09:07:09 -------------------------------------------------------------------------------- Problem List Details Patient Name: Date of Service: [name], [name] 07/20/2022 8:15 A M Medical Record Number: [id number] Patient Account Number:  [id number] Date of Birth/Sex: Treating RN: [birth year] (36 y.o. [name] Primary Care Provider: [name] Other Clinician: Referring Provider: Treating Provider/Extender: [name] in Treatment: [phone] Active Problems ICD-10 Encounter Code Description Active Date MDM Diagnosis L97.528 Non-pressure chronic ulcer of other part of left foot with other specified 10/24/2019 No Yes severity E11.621 Type 2 diabetes mellitus with foot ulcer 10/24/2019 No Yes Inactive Problems ICD-10 Code Description Active Date Inactive Date L97.518 Non-pressure chronic ulcer of other part of right foot with other specified severity 07/14/2020 07/14/2020 L97.518 Non-pressure chronic ulcer of other part of right foot with other specified severity 10/24/2019 10/24/2019 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, right ankle and foot 10/24/2019 10/24/2019 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 08/10/2020 08/10/2020 [place], [name] ([id number]) [id number] Page 11 of [phone] Other chronic hematogenous osteomyelitis, left ankle and foot 10/24/2019 10/24/2019 L97.322 Non-pressure chronic ulcer of left ankle with fat layer exposed 09/29/2021 09/29/2021 B95.62 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 10/24/2019 10/24/2019 classified elsewhere Resolved Problems Electronic Signature(s) Signed: 07/20/2022 9:04:26 AM By: Duanne Guess MD FACS Entered By: Duanne Guess on 07/20/2022 09:04:26 -------------------------------------------------------------------------------- Progress Note Details Patient Name: Date of Service: [name], [name] 07/20/2022 8:15 A M Medical Record Number: [id number] Patient Account Number: [id number] Date of Birth/Sex: Treating RN: 1986/03/27 (36 y.o. 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 He comes in for review of a wound over the plantar fifth metatarsal head extending into  the lateral part of the foot. He was followed for this previously by his podiatrist Dr. Darreld Mclean. As the patient tells his story he went to see podiatry first for a swelling he developed on the lateral part of his fifth metatarsal head in May. He states this was "open" by podiatry and the area closed. He was followed up in June and it was again opened callus removed and it closed promptly. There were plans being made for surgery on the fifth metatarsal head in June however his blood sugar was apparently too high for anesthesia. Apparently the area was debrided and opened again in June and it is never closed since. Looking over the records from podiatry I am really not able to follow this. It was clear when he was first seen it was before 5/14 at that point he already had a wound. By 5/17 the ulcer was resolved. I do not see anything about a procedure. On 5/28 noted to have pre-ulcerative moderate keratosis. X-ray noted 1/5 contracted toe and tailor's bunion and metatarsal deformity. On a visit date on 09/28/2018 the dorsal part of the left foot it healed and resolved. There was concern about swelling in his lower extremity he was sent to the ER.. As far as I can tell he was seen in the ER on 7/12 with an ulcer on his left foot. A DVT rule out of the left leg was negative. I do  not think I have complete records from podiatry but I am not able to verify the procedures this patient states he had. He states after the last procedure the wound has never closed although I am not able to follow this in the records I have from podiatry. He has not had a recent x-ray The patient has been using Neosporin on the wound. He is wearing a Darco shoe. He is still very active up on his foot working and exercising. Past medical history; type 2 diabetes ketosis-prone, leg swelling with a negative DVT study in July. Non-smoker ABI in our clinic was 0.85 on the left 10/16; substantial wound on the plantar left fifth met head extending laterally almost to the dorsal fifth MTP. We have been using silver alginate we gave him a Darco forefoot off loader. An x-ray did not show evidence of osteomyelitis did note soft tissue emphysema which I think was due to gas tracking through an open wound. There is no doubt in my mind he requires an MRI 10/23; MRI not booked until 3 November at the earliest this is largely due to his glucose sensor in the right arm. We have been using silver alginate. There has been an improvement 10/29; I am still not exactly sure when his MRI is booked for. He says it is the third but it is the 10th in epic. This definitely needs to be done. He is running a low-grade fever today but no other symptoms. No real improvement in the 1 02/26/2019 patient presents today for a follow-up visit here in our clinic he is last been seen in the clinic on October 29. Subsequently we were working on getting MRI to evaluate and see what exactly was going on and where we would need to go from the standpoint of whether or not he had osteomyelitis and again what treatments were going be required. Subsequently the patient ended up being admitted to the hospital on 02/07/2019 and was discharged on 02/14/2019. This is a somewhat interesting admission with a discharge diagnosis of pneumonia due to  COVID-19 although he was positive for COVID-19 when tested at the urgent care but negative x2 when he was actually in the hospital. With that being said he did have acute respiratory failure with hypoxia and it was noted he also have a left foot ulceration with osteomyelitis. With that being said he did require oxygen for his pneumonia and I level 4 L. He was placed on antivirals and steroids for the COVID-19. He was also transferred to the [facility] at one point. Nonetheless he did subsequently discharged home and since being home has done much better in that regard. The CT angiogram did not show any pulmonary embolism. With regard to the osteomyelitis the patient was placed on vancomycin and Zosyn while in the hospital but has been changed to Augmentin at discharge. It was also recommended that he follow- up with wound care and podiatry. Podiatry however wanted him to see [name] according to the patient prior to them doing anything further. His hemoglobin A1c was 9.9 as noted in the hospital. Have an MRI of the left foot performed while in the hospital on 02/04/2019. This showed evidence of septic arthritis at the fifth MTP joint and osteomyelitis involving the fifth metatarsal head and proximal phalanx. There is an overlying plantar open wound noted an abscess tracking back along the lateral aspect of the fifth metatarsal shaft. There is otherwise diffuse cellulitis and mild fasciitis without findings of polymyositis. The patient did have recently pneumonia secondary to COVID-19 I looked in the chart through epic and it does appear that the patient may need to have an additional x-ray [place], [name] ([id number]) [id number] Page 12 of 22 just to ensure everything is cleared and that he has no airspace disease prior to putting him into the chamber. 03/05/2019; patient was readmitted to the clinic last week. He was hospitalized twice for a viral upper respiratory tract  infection from 11/1 through 11/4 and then 11/5 through 11/12 ultimately this turned out to be Covid pneumonitis. Although he was discharged on oxygen he is not using it. He says he feels fine. He has no exercise limitation no cough no sputum. His O2 sat in our clinic today was 100% on room air. 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 He received Vanco and Zosyn in the hospital and then was discharged on 2 weeks of Augmentin. I do not see any relevant cultures. He was supposed to follow-up with infectious disease but I do not see that he has an appointment. 12/8; patient saw Dr. Ronnie Derby of infectious disease last week. He felt that he had had adequate antibiotic therapy. He did not go to follow-up with Dr. Logan Bores of podiatry and I have again talked to him about the pros and cons of this. He does not want to consider a ray amputation of this time. He is aware of the risks of recurrence, migration etc. He started HBO today and tolerated this well. He can complete the Augmentin that I gave him last week. I have looked over the lab work that Dr. Effie Shy ordered his C-reactive protein was 3.3 and his sedimentation rate was 17. The C-reactive protein is never really been measurably that high in this patient 12/15; not much change in the wound today however he has undermining along the lateral part of the foot again more extensively than last week. He has some rims of epithelialization. We have been  using silver alginate. He is undergoing hyperbarics but did not dive today 12/18; in for his obligatory first total contact cast change. Unfortunately there was pus coming from the undermining area around his fifth metatarsal head. This was cultured but will preclude reapplication of a cast. He is seen in conjunction with HBO 12/24; patient had staph lugdunensis in the wound in the undermining area  laterally last time. We put him on doxycycline which should have covered this. The wound looks better today. I am going to give him another week of doxycycline before reattempting the total contact cast 12/31; the patient is completing antibiotics. Hemorrhagic debris in the distal part of the wound with some undermining distally. He also had hyper granulation. Extensive debridement with a #5 curette. The infected area that was on the lateral part of the fifth met head is closed over. I do not think he needs any more antibiotics. Patient was seen prior to HBO. Preparations for a total contact cast were made in the cast will be placed post hyperbarics 04/11/19; once again the patient arrives today without complaint. He had been in a cast all week noted that he had heavy drainage this week. This resulted in large raised areas of macerated tissue around the wound 1/14; wound bed looks better slightly smaller. Hydrofera Blue has been changing himself. He had a heavy drainage last week which caused a lot of maceration around the wound so I took him out of a total contact cast he says the drainage is actually better this week He is seen today in conjunction with HBO 1/21; returns to clinic. He was up in [place] for a day or 2 attending a funeral. He comes back in with the wound larger and with a large area of exposed bone. He had osteomyelitis and septic arthritis of the fifth left metatarsal head while he was in hospital. He received IV antibiotics in the hospital for a prolonged period of time then 3 weeks of Augmentin. Subsequently I gave him 2 weeks of doxycycline for more superficial wound infection. When I saw this last week the wound was smaller the surface of the wound looks satisfactory. 1/28; patient missed hyperbarics today. Bone biopsy I did last time showed Enterococcus faecalis and Staphylococcus lugdunensis . He has a wide area of exposed bone. We are going to use silver alginate as of  today. I had another ethical discussion with the patient. 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 In this situation I think the likelihood of healing this is low. Therefore I have recommended a ray amputation and with the patient's agreement I have referred him to Dr. Victorino Dike. The other issue is that his compliance with hyperbarics has been minimal because of his work schedule and given his underlying decision I am going to stop this today READMISSION 10/24/2019 MRI 09/29/2019 left foot IMPRESSION: 1. Apparent skin ulceration inferior and lateral to the 5th metatarsal base with underlying heterogeneous T2 signal and enhancement in the subcutaneous fat. Small peripherally enhancing fluid collections along the plantar and lateral aspects of the 5th metatarsal base suspicious for abscesses. 2. Interval amputation through the mid 5th metatarsal with nonspecific low-level marrow edema and enhancement. Given the proximity to the adjacent soft tissue inflammatory changes, osteomyelitis cannot be excluded. 3. The additional bones appear unremarkable. MRI 09/29/2019 right foot IMPRESSION: 1. Soft tissue ulceration lateral to the 5th MTP joint. There is low-level T2 hyperintensity within the 4th and 5th metatarsal heads and adjacent proximal phalanges  without abnormal T1 signal or cortical destruction. These findings are nonspecific and could be seen with early marrow edema, hyperemia or early osteomyelitis. No evidence of septic joint. 2. Mild tenosynovitis and synovial enhancement associated with the extensor digitorum tendons at the level of the midfoot. 3. Diffuse low-level muscular T2 hyperintensity and enhancement, most consistent with diabetic myopathy. LEFT FOOT BONE Methicillin resistant staphylococcus aureus Staphylococcus lugdunensis MIC MIC CIPROFLOXACIN >=8 RESISTANT Resistant <=0.5 SENSI... Sensitive CLINDAMYCIN <=0.25 SENS... Sensitive >=8  RESISTANT Resistant ERYTHROMYCIN >=8 RESISTANT Resistant >=8 RESISTANT Resistant GENTAMICIN <=0.5 SENSI... Sensitive <=0.5 SENSI... Sensitive Inducible Clindamycin NEGATIVE Sensitive NEGATIVE Sensitive OXACILLIN >=4 RESISTANT Resistant 2 SENSITIVE Sensitive RIFAMPIN <=0.5 SENSI... Sensitive <=0.5 SENSI... Sensitive TETRACYCLINE <=1 SENSITIVE Sensitive <=1 SENSITIVE Sensitive TRIMETH/SULFA <=10 SENSIT Sensitive <=10 SENSIT Sensitive ... Marland Kitchen.. VANCOMYCIN 1 SENSITIVE Sensitive <=0.5 SENSI... Sensitive Right foot bone . Component 3 wk ago Specimen Description BONE [place], [name] ([id number]) [id number] Page 13 of 22 Special Requests RIGHT 4 METATARSAL SAMPLE B Gram Stain NO WBC SEEN NO ORGANISMS SEEN Culture RARE METHICILLIN RESISTANT STAPHYLOCOCCUS AUREUS NO ANAEROBES ISOLATED Performed at [facility], [address] [place], [place] [address] Report Status 10/08/2019 FINAL Organism ID, Bacteria METHICILLIN RESISTANT STAPHYLOCOCCUS AUREUS Resulting Agency [facility] Susceptibility Methicillin resistant staphylococcus aureus MIC CIPROFLOXACIN >=8 RESISTANT Resistant CLINDAMYCIN <=0.25 SENS... Sensitive ERYTHROMYCIN >=8 RESISTANT Resistant GENTAMICIN <=0.5 SENSI... Sensitive Inducible Clindamycin NEGATIVE Sensitive OXACILLIN >=4 RESISTANT Resistant RIFAMPIN <=0.5 SENSI... Sensitive TETRACYCLINE <=1 SENSITIVE Sensitive TRIMETH/SULFA <=10 SENSIT Sensitive ... VANCOMYCIN 1 SENSITIVE Sensitive This is a patient we had in clinic earlier this year with a wound over his left fifth metatarsal head. 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 Victorino Dike on 05/16/2019. He seems to  have developed problems with wounds on his bilateral feet in June 2021 although he may have had problems earlier than this. He was in an urgent care with a right foot ulcer on 09/26/2019 and given a course of doxycycline. This was apparently after having trouble getting into see orthopedics. He was seen by podiatry on 09/28/2019 noted to have bilateral lower extremity ulcers including the left lateral fifth metatarsal base and the right subfifth met head. It was noted that had purulent drainage at that time. He required hospitalization from 6/20 through 7/2. This was because of worsening right foot wounds. He underwent bilateral operative incision and drainage and bone biopsies bilaterally. Culture results are listed above. He has been referred back to clinic by Dr. Ardelle Anton of podiatry. He is also followed by Dr. Orvan Falconer who saw him yesterday. He was discharged from hospital on Zyvox Flagyl and Levaquin and yesterday changed to doxycycline Flagyl and Levaquin. His inflammatory markers on 6/26 showed a sedimentation rate of 129 and a C-reactive protein of 5. This is improved to 14 and 1.3 respectively. This would indicate improvement. ABIs in our clinic today were 1.23 on the right and 1.20 on the left 11/01/2019 on evaluation today patient appears to be doing fairly well in regard to the wounds on his feet at this point. Fortunately there is no signs of active infection at this time. No fevers, chills, nausea, vomiting, or diarrhea. He currently is seeing infectious disease and still under their care at this point. Subsequently he also has  both wounds which she has not been using collagen on as he did not receive that in his packaging he did not call us and let us know that. Apparently that just was missed on the order. Nonetheless we will get that straightened out today. 8/9-Patient returns for bilateral foot wounds, using Prisma with hydrogel moistened dressings, and the wounds appear stable. Patient  using surgical shoes, avoiding much pressure or weightbearing as much as possible 8/16; patient has bilateral foot wounds. 1 on the right lateral foot proximally the other is on the left mid lateral foot. Both required debridement of callus and thick skin around the wounds. We have been using silver collagen 8/27; patient has bilateral lateral foot wounds. The area on the left substantially surrounded by callus and dry skin. This was removed from the wound edge. The underlying wound is small. The area on the right measured somewhat smaller today. We've been using silver collagen the patient was on antibiotics for underlying osteomyelitis in the left foot. Unfortunately I did not update his antibiotics during today's visit. 9/10 I reviewed Dr. Blair Dolphin last notes he felt he had completed antibiotics his inflammatory markers were reasonably well controlled. He has a small wound on the lateral left foot and a tiny area on the right which is just above closed. He is using Hydrofera Blue with border foam he has bilateral surgical shoes 9/24; 2 week f/u. doing well. right foot is closed. left foot still undermined. 10/14; right foot remains closed at the fifth met head. The area over the base of the left fifth metatarsal has a small open area but considerable undermining towards the plantar foot. Thick callus skin around this suggests an adequate pressure relief. We have talked about this. He says he is going to go back into his cam boot. I suggested a total contact cast he did not seem enamored with this suggestion 10/26; left foot base of the fifth metatarsal. Same condition as last time. He has skin over the area with an open wound however the skin is not adherent. He went to see Dr. Loreta Ave who did an x-ray and culture of his foot I have not reviewed the x-ray but the patient was not told anything. He is on doxycycline 11/11; since the patient was last here he was in the emergency room on 10/30 he was  concerned about swelling in the left foot. They did not do any cultures or x-rays. They changed his antibiotics to cephalexin. Previous culture showed group B strep. The cephalexin is appropriate as doxycycline has less than predictable coverage. Arrives in clinic today with swelling over this area under the wound. He also has a new wound on the right fifth metatarsal head 11/18; the patient has a difficult wound on the lateral aspect of the left fifth metatarsal head. The wound was almost ballotable last week I opened it slightly expecting to see purulence however there was just bleeding. I cultured this this was negative. X-ray unchanged. We are trying to get an MRI but I am not sure were going to be able to get this through his insurance. He also has an area on the right lateral fifth metatarsal head this looks healthier 12/3; the patient finally got our MRI. Surprisingly this did not show osteomyelitis. I did show the soft tissue ulceration at the lateral plantar aspect of the fifth metatarsal base with a tiny residual 6 mm abscess overlying the superficial fascia I have tried to culture this area I have not been  able to get this to grow anything. Nevertheless the protruding tissue looks aggravated. I suspect we should try to treat the underlying "abscess with broad-spectrum antibiotics. I am going to start him on Levaquin and Flagyl. He has much less edema in his legs and I am going to continue to wrap his legs and see him weekly 12/10. I started Levaquin and Flagyl on him last week. He just picked up the Flagyl apparently there was some delay. The worry is the wound on the left fifth metatarsal base which is substantial and worsening. His foot looks like he inverts at the ankle making this a weightbearing surface. Certainly no improvement in fact I think the measurements of this are somewhat worse. We have been using 12/17; he apparently just got the Levaquin yesterday this is 2 weeks after the  fact. He has completed the Flagyl. The area over the left fifth metatarsal base still has protruding granulation tissue although it does not look quite as bad as it did some weeks ago. He has severe bilateral lymphedema although we have not been treating him for wounds on his legs this is definitely going to require compression. There was so much edema in the left I did not wish to put him in a total contact cast today. I am going to increase his compression from 3-4 layer. The area on the right lateral fifth met head actually look quite good and superficial. 12/23; patient arrived with callus on the right fifth met head and the substantial hyper granulated callused wound on the base of his fifth metatarsal. He says he is completing his Levaquin in 2 days but I do not think that adds up with what I gave him but I will have to double check this. We are using Hydrofera Blue on both areas. My plan is to put the left leg in a cast the week after New Year's 04/06/2020; patient's wounds about the same. Right lateral fifth metatarsal head and left lateral foot over the base of the fifth metatarsal. There is undermining on the left lateral foot which I removed before application of total contact cast continuing with Hydrofera Blue new. Patient tells me he was seen by endocrinology today lab work was done [Dr. Kerr]. Also wondering whether he was referred to cardiology. I went over some lab work from previously does not have chronic renal failure certainly not nephrotic range proteinuria he does have very poorly controlled diabetes but this is not his most updated lab work. Hemoglobin A1c has been over 11 1/10; the patient had a considerable amount of leakage towards mid part of his left foot with macerated skin however the wound surface looks better the area on the right lateral fifth met head is better as well. I am going to change the dressing on the left foot under the total contact cast to silver alginate,  continue [place], [name] ([id number]) [id number] Page 14 of 22 with Hydrofera Blue on the right. 1/20; patient was in the total contact cast for 10 days. Considerable amount of drainage although the skin around the wound does not look too bad on the left foot. The area on the right fifth metatarsal head is closed. Our nursing staff reports large amount of drainage out of the left lateral foot wound 1/25; continues with copious amounts of drainage described by our intake staff. PCR culture I did last week showed E. coli and Enterococcus faecalis and low quantities. Multiple resistance genes documented including extended spectrum beta lactamase, MRSA, MRSE, quinolone,  tetracycline. The wound is not quite as good this week as it was 5 days ago but about the same size 2/3; continues with copious amounts of malodorous drainage per our intake nurse. The PCR culture I did 2 weeks ago showed E. coli and low quantities of Enterococcus. There were multiple resistance genes detected. I put Neosporin on him last week although this does not seem to have helped. The wound is slightly deeper today. Offloading continues to be an issue here although with the amount of drainage she has a total contact cast is just not going to work 2/10; moderate amount of drainage. Patient reports he cannot get his stocking on over the dressing. I told him we have to do that the nurse gave him suggestions on how to make this work. The wound is on the bottom and lateral part of his left foot. Is cultured predominantly grew low amounts of Enterococcus, E. coli and anaerobes. There were multiple resistance genes detected including extended spectrum beta lactamase, quinolone, tetracycline. I could not think of an easy oral combination to address this so for now I am going to do topical antibiotics provided by [facility] I think the main agents here are vancomycin and an aminoglycoside. We have to be able to give  him access to the wounds to get the topical antibiotic on 2/17; moderate amount of drainage this is unchanged. He has his Keystone topical antibiotic against the deep tissue culture organisms. He has been using this and changing the dressing daily. Silver alginate on the wound surface. 2/24; using Keystone antibiotic with silver alginate on the top. He had too much drainage for a total contact cast at one point although I think that is improving and I think in the next week or 2 it might be possible to replace a total contact cast I did not do this today. In general the wound surface looks healthy however he continues to have thick rims of skin and subcutaneous tissue around the wide area of the circumference which I debrided 06/04/2020 upon evaluation today patient appears to be doing well in regard to his wound. I do feel like he is showing signs of improvement. There is little bit of callus and dead tissue around the edges of the wound as well as what appears to be a little bit of a sinus tract that is off to the side laterally I would perform debridement to clear that away today. 3/17; left lateral foot. The wound looks about the same as I remember. Not much depth surface looks healthy. No evidence of infection 3/25; left lateral foot. Wound surface looks about the same. Separating epithelium from the circumference. There really is no evidence of infection here however not making progress by my view 3/29; left lateral foot. Surface of the wound again looks reasonably healthy still thick skin and subcutaneous tissue around the wound margins. There is no evidence of infection. One of the concerns being brought up by the nurses has again the amount of drainage vis--vis continued use of a total contact cast 4/5; left lateral foot at roughly the base of the fifth metatarsal. Nice healthy looking granulated tissue with rims of epithelialization. The overall wound measurements are not any better but the  tissue looks healthy. The only concern is the amount of drainage although he has no surrounding maceration with what we have been doing recently to absorb fluid and protect his skin. He also has lymphedema. 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 He states that the kerlix/coban under the TCC rolled down and could not get it back up. He has been using an offloading scooter and has somehow hurt his right foot using this device. This happened last week. He states that the side of his right foot developed a blister and opened. The top of his foot also has a few small open wounds he thinks is due to his socks rubbing in his shoes. He has not been using any dressings to the wound. He denies purulent drainage, fever/chills or erythema to the wounds. 4/22; patient presents for 1 week follow-up. He developed new wounds to the right foot that were evaluated at last clinic visit. He continues to have a total contact cast to the left leg and he reports no issues. He has been using silver collagen to the right foot wounds with no issues. He denies purulent drainage, fever/chills or erythema to the right foot wounds. He has no complaints today 4/25; patient presents for 1 week follow-up. He has a total contact cast of the left leg and reports no issues. He has been using silver alginate to the right foot wound. He denies purulent drainage, fever/chills or erythema to the right foot wounds. 5/2 patient presents for 1 week follow-up. T contact cast on the left. The wound which is on the base of the plantar foot at the base of the fifth metatarsal otal actually looks quite good and dimensions continue to gradually contract. HOWEVER the area on the right lateral fifth  metatarsal head is much larger than what I remember from 2 weeks ago. Once more is he has significant levels of hypergranulation. Noteworthy that he had this same hyper granulated response on his wound on the left foot at one point in time. So much so that he I thought there was an underlying fluid collection. Based on this I think this just needs debridement. 5/9; the wound on the left actually continues to be gradually smaller with a healthy surface. Slight amount of drainage and maceration of the skin around but not too bad. However he has a large wound over the right fifth metatarsal head very much in the same configuration as his left foot wound was initially. I used silver nitrate to address the hyper granulated tissue no mechanical debridement 5/16; area on the left foot did not look as healthy this week deeper thick surrounding macerated skin and subcutaneous tissue. oo The area on the right foot fifth met head was about the same oo The area on the right ankle that we identified last week is completely broken down into an open wound presumably a stocking rubbing issue 5/23; patient has been using a total contact cast to the left side. He has been using silver alginate underneath. He has also been using silver alginate to the right foot wounds. He has no complaints today. He denies any signs of infection. 5/31; the left-sided wound looks some better measure smaller surface granulation looks better. We have been using silver alginate under the total contact cast oo The large area on his right fifth met head and right dorsal foot look about the same still using silver alginate 6/6; neither side is good as I was hoping although the surface area dimensions are  better. A lot of maceration on his left and right foot around the wound edge. Area on the dorsal right foot looks better. He says he was traveling. I am not sure what does the amount of maceration around the plantar wounds may be drainage  issues 6/13; in general the wound surfaces look quite good on both sides. Macerated skin and raised edges around the wound required debridement although in general especially on the left the surface area seems improved. oo The area on the right dorsal ankle is about the same I thought this would not be such a problem to close 6/20; not much change in either wound although the one on the right looks a little better. Both wounds have thick macerated edges to the skin requiring debridements. We have been using silver alginate. The area on the dorsal right ankle is still open I thought this would be closed. 6/28; patient comes in today with a marked deterioration in the right foot wound fifth met head. Wide area of exposed bone this is a drastic change from last time. The area on the left there we have been casting is stagnant. We have been using silver alginate in both wound areas. 7/5; bone culture I did for PCR last time was positive for Pseudomonas, group B strep, Enterococcus and Staph aureus. There was no suggestion of methicillin resistance or ampicillin resistant genes. This was resistant to tetracycline however He comes into the clinic today with the area over his right plantar fifth metatarsal head which had been doing so well 2 weeks ago completely necrotic feeling bone. I do not know that this is going to be salvageable. The left foot wound is certainly no smaller but it has a better surface and is superficial. 7/8; patient called in this morning to say that his total contact cast was rubbing against his foot. He states he is doing fine overall. He denies signs of infection. 7/12; continued deterioration in the wound over the right fifth metatarsal head crumbling bone. This is not going to be salvageable. The patient agrees and wants to be referred to Dr. Victorino Dike which we will attempt to arrange as soon as possible. I am going to continue him on antibiotics as long as that takes so I will  renew those today. The area on the left foot which is the base of the fifth metatarsal continues to look somewhat better. Healthy looking tissue no depth no debridement is necessary here. 7/20; the patient was kindly seen by Dr. Victorino Dike of orthopedics on 10/19/2020. He agreed that he needed a ray amputation on the right and he said he would [place], [name] ([id number]) [id number] Page 15 of 22 have a look at the fourth as well while he was intraoperative. Towards this end we have taken him out of the total contact cast on the left we will put him in a wrap with Hydrofera Blue. As I understand things surgery is planned for 7/21 7/27; patient had his surgery last Thursday. 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 Apparently everything went well we did not still disturb that today The area on the left foot actually looks quite good. He has been much less mobile which probably explains this he did not seem to do well in the total contact cast secondary to drainage and maceration I think. 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 Fortunately there is no  evidence of active infection at this time. No fevers, chills, nausea, vomiting, or diarrhea. Overall I think that he is actually doing extremely well. Nonetheless I do believe that he is staying off of this more following the surgery in his right foot that is the reason the left is doing so great. 8/16; left plantar foot wound. This looks smaller than the last time I saw this he is using Hydrofera Blue. The surgical wound on the right foot is being followed by Dr. Victorino Dike we did not look at this today. He has surgical shoes on both feet 8/23; left plantar foot wound not as good this week. Surrounding macerated skin and subcutaneous tissue everything looks moist and wet. I do not think he is offloading this adequately. He  is using a surgical shoe Apparently the right foot surgical wound is not open although I did not check his foot 8/31; left plantar foot lateral aspect. Much improved this week. He has no maceration. Some improvement in the surface area of the wound but most impressively the depth is come in we are using silver alginate. The patient is a Veterinary surgeon. He is asked that we write him a letter so he can go back to work. I have also tried to see if we can write something that will allow him to limit the amount of time that he is on his foot at work. 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 Hopefully the latter can be avoided 9/6; I believe the patient missed an appointment last week. He arrives in today with a wound looking roughly the same certainly no better. Undermining laterally and also inferiorly. We used molecuLight today in training with the patient's permission.. We are using silver alginate 9/21 wound is measuring bigger this week although this may have to do with the aggressive circumferential debridement last week in response to the blush fluorescence on the MolecuLight. Culture I did last week showed significant MSSA and E. coli. I put him on Augmentin but he has not started it yet. We are also going to send this for compounded antibiotics at [facility]. There is no evidence of systemic infection 9/29; silver alginate. His Keystone arrived. He is completing Augmentin in 2 days. Offloading in a cam boot. Moderate drainage per our intake staff 10/5; using silver alginate. He has been using his [place]. He has completed his Augmentin. Per our intake nurse still a lot of drainage, far too much to consider a total contact cast. Wound measures about the same. He had the same undermining area that I defined last week from a roughly 11-3. I remove this today 10/12; using silver alginate he is using the [place].  He comes in for a nurse visit hence we are applying Jodie Echevaria twice a week. Measuring slightly better today and less notable drainage. Extensive debridement of the wound edge last time 10/18; using topical Keystone and silver alginate and a soft cast. Wound measurements about the same. Drainage was through his soft cast. We are changing this twice a week Tuesdays and Friday 10/25; comes in with moderate drainage. Still using Keystone silver alginate and a soft cast. Wound dimensions completely the same.He has a lot of edema in the left leg he has lymphedema. Asking for [name] to consider wrapping him as he cannot get his stocking on over the soft cast 11/2; comes in with moderate to large drainage slightly smaller in  terms of width we have been using Keystone. His wound looks satisfactory but not much improvement 11/4; patient presents today for obligatory cast change. Has no issues or complaints today. He denies signs of infection. 11/9; patient traveled this weekend to [place], was on the cast quite a bit. Staining of the cast with black material from his walking boot. Drainage was not quite as bad as we feared. Using silver alginate and Keystone 11/16; we do not have size for cast therefore we have been putting a soft cast on him since the change on Friday. Still a significant amount of drainage necessitating changing twice a week. We have been using the Keystone at cast changes either hard or soft as well as silver alginate Comes in the clinic with things actually looking fairly good improvement in width. He says his offloading is about the same 02/24/2021 upon evaluation today patient actually comes back in and is doing excellent in regard to his foot ulcer this is significantly smaller even compared to the last visit. The soft cast seems to have done extremely well for him which is great news. I do not see any signs of infection minimal debridement will be needed today. 11/30; left lateral foot much  improved half a centimeter improvement in surface area. No evidence of infection. He seems to be doing better with the soft cast in the TCC therefore we will continue with this. He comes back in later in the week for a change with the nurses. This is due to drainage 12/6; no improvement in dimensions. Under illumination some debris on the surface we have been using silver alginate, soft cast. If there is anything optimistic here he seems to have have less drainage 12/13. Dimensions are improved both length and width and slightly in depth. Appears to be quite healthy today. Raised edges of this thick skin and callus around the edges however. He is in a soft cast were bringing him back once for a change on Friday. Drainage is better 12/20. Dimensions are improved. He still has raised edges of thick skin and callus around the edges. We are using a soft cast 12/28; comes in today with thick callus around the wound. Using silver under alginate under a soft cast. I do not think there is much improvement in any measurement 2023 04/06/2021; patient was put in a total contact cast. Unfortunately not much change in surface area 1/10; not much different still thick callus and skin around the edge in spite of the total contact cast. This was just debrided last week we have been using the [facility] compounded antibiotic and silver alginate under a total contact cast 1/18 the patient's wound on the left side is doing nicely. smaller HOWEVER he comes in today with a wound on the right foot laterally. blister most likely serosangquenous drainage 1/24; the patient continues to do well in terms of the plantar left foot which is continued to contract using silver alginate under the total contact cast HOWEVER the right lateral foot is bigger with denuded skin around the edges. I used pickups and a #15 scalpel to remove this this looks like the remanence of a large blister. Cannot rule out infection. Culture in this area I  did last week showed Staphylococcus lugdunensis few colonies. I am going to try to address this with his Jodie Echevaria antibiotic that is done so well on the left having linezolid and this should cover the staph 2/1; the patient's wound on his left foot which was the original  plantar foot wound thick skin and eschar around the edges even in the total contact cast but the wound surface does not look too bad The real problem is on how his right lateral foot at roughly the base of the fifth metatarsal. The wound is completely necrotic more worrisome than that there is swelling around the edges of this. We have been using silver alginate on both wounds and Keystone on the right foot. Unfortunately I think he is going to require systemic antibiotics while we await cultures. He did not get the x-ray done that we ordered last week [lost the prescription 2/7; disappointingly in the area on the left foot which we are treating with a total contact cast is still not closed although it is much smaller. He continues to have a lot of callus around the wound edge. -Right lateral foot culture I did last week was negative x-ray also negative for osteomyelitis. 2/15: TCC silver alginate on the left and silver alginate on the right lateral. No real improvement in either area [place], [name] ([id number]) [id number] Page 16 of 22 05/26/2021: T oday, the wounds are roughly the same size as at his previous visit, post-debridement. He continues to endorse fairly substantial drainage, particularly on the right. He has been in a total contact cast on the left. There is still some callus surrounding this lesion. On the right, the periwound skin is quite macerated, along with surrounding callus. The center of the right-sided wound also has some dark, densely adherent material, which is very difficult to remove. 06/02/2021: Today, both wounds are slightly smaller. He has been using zinc oxide ointment around  the right ulcer and the degree of maceration has improved markedly. There continues to be an area of nonviable tissue in the center of the right sided ulcer. The left-sided wound, which has been in the total contact cast. Appears clean and the degree of callus around it is less than previously. 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 He had to take the stairs and both wounds have increased in size. The left foot, which has been in a total contact cast, has developed a tunnel tracking to the lateral aspect of his foot. The nonviable tissue in the center of the right-sided ulcer remains recalcitrant to debridement. There is significant undermining surrounding the entirety of the left sided wound. 06/16/2021: The elevator at school has been fixed and the patient has been able to avoid putting as much weight on his wounds over the past week. We converted the left foot wound into a single lesion today, but despite this, the wound is actually smaller. The base is healthy with limited periwound callus. On the right, the central necrotic area is still present. He continues to be quite macerated around the right sided wound, despite applying barrier cream. This does, however, have the benefit of softening the callus to make it more easily removable. 06/23/2021: Today, the left wound is smaller. The lateral aspect that had opened up previously is now closed. The wound base has a healthy bed of granulation tissue and minimal slough. Unfortunately, on the right, the wound is larger and continues to be fairly macerated. He has also reopened the wound at his right ankle. He thinks this is due to the gait he has adopted secondary to his total contact cast and boot. 06/30/2021: T oday, both wounds are a little bit larger. The lateral aspect on the left has remained closed. He  continues to have significant periwound maceration. The culture that I took  from the right sided wound grew a population of bacteria that is not covered by his current [facility] antibiotic. The center of the right- sided wound continues to appear necrotic with nonviable fat. It probes deeper today, but does not reach bone. 07/07/2021: The periwound maceration is a little bit less today. The right lateral foot wound has some areas that appear more viable and the necrotic center also looks a little bit better. The wound on the dorsal surface of his right foot near the ankle is contracting and the surface appears healthy. The left plantar wound surface looks healthy, but there is some new undermining on the medial portion. He did get his new Keystone antibiotic and began applying that to the right foot wound on Saturday. 07/14/2021: The intake nurse reported substantial drainage from his wounds, but the periwound skin actually looks better than is typical for him. The wound on the dorsal surface of his right foot near the ankle is smaller and just has a small open area underneath some dried eschar. The left plantar wound surface looks healthy and there has been no significant accumulation of callus. The right lateral foot wound looks quite a bit better, with the central portion, which typically appears necrotic, looking more viable albeit pale. 07/22/2021: His left foot is extremely macerated today. The wound is about the same size. The wound on the dorsal surface of his right foot near the ankle had closed, but he traumatized it removing the dressing and there is a tiny skin tear in that location. The right lateral foot wound is bigger, but the surface appears healthy. 07/30/2021: The wound on the dorsal surface of his right foot near the ankle is closed. The right lateral foot wound again is a little bit bigger due to some undermining. The periwound skin is in better condition, however. He has been applying zinc oxide. The wound surface is a little bit dry today. On the left,  he does not have the substantial maceration that we frequently see. The wound itself is smaller and has a clean surface. 08/06/2021: Both wounds seem to have deteriorated over the past week. The right lateral foot wound has a dry surface but the periwound is boggy.. Overall wound dimensions are about the same. On the left, the wound is about the same size, but there is more undermining present underneath periwound callus. 08/13/2021: The right sided wound looks about the same, but on the left there has been substantial deterioration. The undermining continues to extend under periwound callus. Once this was removed, substantial extension of the wound was present. There is no odor or purulent drainage but clearly the wounds have broken down. 08/20/2021: The wounds look about the same today. He has been out of his total contact cast and has just been changing the dressings using topical Keystone with PolyMem Ag, Kerlix and Ace bandages. The wound on the top of his right ankle has reopened but this is quite small. There was a little bit of purulent material that I expressed when examining this wound. 08/24/2021: After the aggressive debridement I performed at his last visit, the wounds actually look a little bit better today. They are smaller with the exception of the wound on the top of his right ankle which is a little bit bigger as some more skin pulled off when he was changing his dressing. We are using topical Keystone with PolyMem Ag Kerlix and Ace bandages. 09/02/2021:  There has been really no change to any of his wounds. 09/16/2021: The patient was hospitalized last week with nausea, vomiting, and dehydration. He says that while he was in the hospital, his wounds were not really addressed properly. T oday, both plantar foot wounds are larger and the periwound skin is macerated. The wound on the dorsum of his right foot has a scab on the top. The right foot now has a crater where previously he had had  nonviable fat. It looks as though this simply died and fell out. The periwound callus is wet. 09/24/2021: His wounds have deteriorated somewhat since his last visit. The wound on the dorsum of his right foot near his ankle is larger and has more nonviable tissue present. The crater in his right foot is even deeper; I cannot quite palpate or probe to bone but I am sure it is close. The wound on his left plantar foot has an odd boggy area in the center that almost feels as though it has fluid within it. He has run out of his topical Keystone antibiotic. We are using silver alginate on his wounds. 09/29/2021: He has developed a new wound on the dorsum of his left foot near his ankle. He says he thinks his wrapping is rubbing in that site. I would concur with this as the wound on his right ankle is larger. The left foot looks about the same. The right foot has the crater that was present last week. No significant slough accumulation, but his foot remains quite swollen and warm despite oral antibiotic therapy. 10/08/2021: All of his wounds look about the same as last week. He did not start his oral antibiotics that are prescribed until just a couple of days ago; his Jodie Echevaria compounded antibiotics formula has been changed and he is awaiting delivery of the new recipe. His MRI that was scheduled for earlier this week was canceled as no prior authorization had been obtained; unfortunately the tech responsible sent an email to my old [place] email, which I no longer use nor have access to. 10/18/2021: The wounds on his bilateral dorsal feet near the ankles are both improved. They are smaller and have just some eschar and slough buildup. The left plantar wound has a fair amount of undermining, but the surface is clean. There is some periwound callus accumulation. On the right plantar foot, there is nonviable fat leading to a deep tunnel that tracks towards his dorsal medial foot. There is periwound callus and  slough accumulation, as well. His right foot and leg remain swollen as compared to the left. 10/25/2021: The wounds on his bilateral dorsal feet and at the ankles have broken down somewhat. They are little bit larger than last week. The left plantar wound continues to undermine laterally but the surface is clean. The right plantar foot wound shows some decreased depth in the tunnel tracking towards his dorsal medial foot. He has not yet had the Doppler study that I ordered; it sounds like there is some confusion about the scheduling of the procedure. In addition, the MRI was denied and I have taken steps to appeal the denial. 11/24/2021: Since our last visit, Mr. Plotkin was admitted to the hospital where an MRI suggested osteomyelitis. He was taken the operating room by podiatry. Bone biopsies were negative for osteomyelitis. They debrided his wounds and applied myriad matrix. He saw them last week and they removed his [place], [name] ([id number]) [id number] Page 17 of 22 staples. He is here today  to continue his wound healing process. T oday, both of the dorsal foot/ankle wounds are substantially smaller. There is just a little eschar overlying the left sided wound and some eschar and slough on the right. The right plantar foot ulcer has the healthiest surface of granulation tissue that I have seen to date. A portion of the myriad matrix failed to take and was hanging loose. It appears that myriad morcells were placed into the tunnel closest to the dorsal portion of his foot. These have sloughed off. The left plantar foot ulcer is about the same size, but has a much healthier surface than in the past. Both plantar ulcers have callus and slough accumulation. 12/07/2021: Left dorsal foot/ankle wound is closed. The right dorsal foot/ankle wound is nearly closed and just has a small open area with some eschar and slough. The right plantar foot wound has contracted quite a bit  since our last visit. It has a healthy surface with just a little bit of slough accumulation and periwound callus buildup. The left plantar foot wound is about the same size but the surface appears healthy. There is a little slough and periwound callus on this side, as well. 12/15/2021: Both dorsal foot/ankle wounds are closed. The right plantar foot wound is substantially smaller than at our last visit. The tunneling that was present has nearly closed. There is just a little bit of slough buildup. The left plantar foot wound is also a little bit smaller today. The surface is the healthiest that I have ever seen it. Light slough and periwound callus accumulation on this side. 12/22/2021: The dorsal ankle/foot wounds remain closed. The right plantar foot wound continues to contract. There is still a bit of depth at the lateral portion of the wound but the surface has a good granulated appearance. The wound on the left is about the same size, the central indented portion is still adherent. It also is clean with good granulation tissue present. The periwound skin and callus are a bit macerated, however. 12/29/2021: Both ankle wounds remain closed. The right plantar foot wound is less than half the size that it was last week. There is no depth any longer and the surface has just a little bit of slough and periwound callus. On the left, the wound is not much smaller, but the surface is healthier with good granulation tissue. There is also a little slough and periwound callus buildup. 01/05/2022: The right plantar foot wound continues to contract and is once again about half the size as it was last week. There is just a little bit of surface slough and periwound callus. On the left, the depth of the wound has decreased and the diameter has started to contract. It is also very clean with just a little slough and biofilm present. 01/12/2022: The right plantar foot wound is smaller again today. There is just some  periwound callus and slough accumulation. On the left, there is a fair amount of undermining and the surface is a little boggy, but no other significant change. 01/19/2022: The right plantar wound continues to contract. There is minimal periwound callus accumulation and just a light layer of slough on the surface. On the left, the surface looks better, but there is fairly significant undermining near the 11:00 portion of the wound, aiming towards his great toe. The skin overlying this area is very healthy and has a good fat layer present. No malodor or purulent drainage. 01/26/2022: The right sided wound continues to contract and has  just a light layer of slough on the surface. Minimal periwound callus. On the left, he still has substantial undermining and the tissue surface is not as robust as I would like to see. No malodor or purulent drainage, but he did say he had a lot of serous drainage over the weekend. 02/02/2022: The right foot wound is about a quarter of the size that it was at his last visit. There is just a little slough on the surface and some periwound callus. On the left, the undermining persists and the tissue is still more pale, but he does not seem to have had as much drainage over the last weekend. We are waiting on a wound VAC. 02/09/2022: His right foot has healed. He received the wound VAC, but did not bring it to clinic with him today. The lateral aspect of the left foot wound has come in a little, but he still has substantial undermining on the medial and distal portion of the wound. Still with macerated periwound callus. 02/16/2022: The wound VAC was applied last Friday. T oday, there has been tremendous improvement in the surface of the wound bed. The undermined portion of the wound has come in a bit. There is still some overhanging dead skin. Overall the wound looks much better. 02/23/2022: No significant change in the overall wound dimensions, but the wound surface continues  to improve and appears healthier and more robust. There is some periwound callus accumulation and overhanging skin. No concern for infection. 03/02/2022: Although the measurements taken in clinic today were unchanged, the visual appearance of the wound looks like it is smaller and the undermining/tunneling is filling with flesh. There is less periwound tissue maceration than usual. No malodor or purulent drainage. No concern for infection. 03/09/2022: The undermining has come in by couple of millimeters. The periwound is a little bit more macerated this week. No concern for infection. 12/13; substantial wound on the left plantar foot. We are using silver collagen and a wound VAC. 03/23/2022: The undermining continues to contract. The wound surface is a little bit dry. 03/30/2022: The undermining has essentially resolved. There is still some depth at the center of the wound. The wound surface has good beefy tissue and the moisture balance is better. 04/06/2022: The wound dimensions are about the same, except that the depth is beginning to fill in. He has had more drainage this week, but there is no obvious concern for infection. 04/13/2022: He has built up a little bit of callus around the edges of the wound, creating some undermining. Otherwise the wound looks fairly unchanged. 04/20/2022: The wound bed tissue looks very healthy and robust. Light biofilm on the surface. There is some built up wet callus around the wound edges, but no undermining. 04/27/2022: The wound surface continues to look very healthy. Although the wound dimensions measured the same, it looks smaller to me. No real callus accumulation this week. Minimal slough on the surface. 05/04/2022: The wound measured smaller and shallower this week. We are still awaiting insurance approval for Apligraf. 05/11/2022: The wound is about the same in terms of depth but measured a little bit narrower today. He has built up a rim of moist callus around  the edges. He has been approved for Apligraf and we will apply that today. 05/18/2022: The wound measured a little bit smaller again today. No significant callus buildup this week. 05/25/2022: The wound was measured about the same size today, but the multiple nooks and crannies in the depths of  the wound seem to be filling in. 06/01/2022: The wound dimensions are smaller today, with the exception of the depth. There is a little bit of moist callus around the edges. 06/08/2022: The wound continues to contract aside from the depth. Once again, there is moist callus around the edges. The odor is less profound this week. 3/13; patient presents for follow-up. He has had 5 applications of Apligraf and is currently not approved for more by insurance. He has also had a wound VAC in place. He has no issues or complaints today. 06/22/2022: The depth of the wound has come in somewhat. There is some undermining created by skin encroaching over the top of the wound. The wound continues to have a pungent odor. [place], [name] ([id number]) [id number] Page 18 of 22 06/29/2022: The wound dimensions are smaller, with the exception of the depth. There is some undermining created by overlying skin and callus. There is still some odor, but it is not as strong as it was on prior occasions. The culture that I took last week returned with a polymicrobial population, Pseudomonas dominant. He is currently taking levofloxacin and a new Keystone prescription should be delivered later this week. 07/06/2022: The depth of the wound has come in by about half a centimeter. He was up on his feet a bit more over the weekend and he says that he has been sliding in his shoe; the result is that he has developed a bit more undermining. The odor from his wound is gone. 07/20/2022: The depth of the wound has come in again this week. There is some undermining of moist callus and skin around the lateral edge of his wound.  No odor and the surface of his wound appears robust and healthy. Patient History Information obtained from Patient. Family History No family history of Cancer, Diabetes, Hereditary Spherocytosis, Hypertension, Kidney Disease, Lung Disease, Seizures, Stroke, Thyroid Problems, Tuberculosis. Social History Never smoker, Marital Status - Single, Alcohol Use - Rarely, Drug Use - No History, Caffeine Use - Never. Medical History Eyes Denies history of Cataracts, Glaucoma, Optic Neuritis Ear/Nose/Mouth/Throat Denies history of Chronic sinus problems/congestion, Middle ear problems Hematologic/Lymphatic Denies history of Anemia, Hemophilia, Human Immunodeficiency Virus, Lymphedema, Sickle Cell Disease Respiratory Denies history of Aspiration, Asthma, Chronic Obstructive Pulmonary Disease (COPD), Pneumothorax, Sleep Apnea, Tuberculosis Cardiovascular Denies history of Angina, Arrhythmia, Congestive Heart Failure, Coronary Artery Disease, Deep Vein Thrombosis, Hypertension, Hypotension, Myocardial Infarction, Peripheral Arterial Disease, Peripheral Venous Disease, Phlebitis, Vasculitis Gastrointestinal Denies history of Cirrhosis , Colitis, Crohnoos, Hepatitis A, Hepatitis B, Hepatitis C Endocrine Patient has history of Type II Diabetes Denies history of Type I Diabetes Immunological Denies history of Lupus Erythematosus, Raynaudoos, Scleroderma Integumentary (Skin) Denies history of History of Burn Musculoskeletal Denies history of Gout, Rheumatoid Arthritis, Osteoarthritis, Osteomyelitis Neurologic Denies history of Dementia, Neuropathy, Quadriplegia, Paraplegia, Seizure Disorder Oncologic Denies history of Received Chemotherapy, Received Radiation Psychiatric Denies history of Anorexia/bulimia, Confinement Anxiety Hospitalization/Surgery History - 11/1-11/06/2018- sepsis foot infection. - 11/4-11/5 02 sats low respiratory distress. Objective Constitutional no acute  distress. Vitals Time Taken: 8:28 AM, Height: 77 in, Weight: 280 lbs, BMI: 33.2, Temperature: 97.8 F, Pulse: 92 bpm, Respiratory Rate: 20 breaths/min, Blood Pressure: 129/84 mmHg, Capillary Blood Glucose: 86 mg/dl. General Notes: glucose per pt report this am Respiratory Normal work of breathing on room air. General Notes: 07/20/2022: The depth of the wound has come in again this week. There is some undermining of moist callus and skin around the lateral edge of his wound. No odor and  the surface of his wound appears robust and healthy. Integumentary (Hair, Skin) Wound #3 status is Open. Original cause of wound was Trauma. The date acquired was: 10/02/2019. The wound has been in treatment 142 weeks. The wound is located on the Left,Lateral,Plantar Foot. The wound measures 2.2cm length x 3cm width x 1.3cm depth; 5.184cm^2 area and 6.739cm^3 volume. There is Fat Layer (Subcutaneous Tissue) exposed. There is no tunneling noted, however, there is undermining starting at 12:00 and ending at 12:00 with a maximum distance of 1cm. There is a medium amount of serosanguineous drainage noted. The wound margin is well defined and not attached to the wound base. There is large (67-100%) red, pale granulation within the wound bed. There is a small (1-33%) amount of necrotic tissue within the wound bed including Adherent Slough. The periwound skin appearance had no abnormalities noted for texture. The periwound skin appearance had no abnormalities noted for color. The periwound skin appearance exhibited: Maceration. Periwound temperature was noted as No Abnormality. 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 There was a Selective/Open Wound Skin/Epidermis Debridement with a total area of 10.5 sq cm performed by Duanne Guess, MD. With the following instrument(s): Curette, Forceps, and Scissors to remove Non-Viable tissue/material. Material removed includes Callus, Skin: Epidermis, and Biofilm. No specimens were taken. A time out was conducted at 08:50, prior to the start of the procedure. A Minimum amount of bleeding was controlled with Pressure. The procedure was tolerated well with a pain level of 0 throughout and a pain level of 0 following the procedure. Post Debridement Measurements: 3.4cm length x 3cm width x 1.3cm depth; 10.414cm^3 volume. Character of Wound/Ulcer Post Debridement is improved. Severity of Tissue Post Debridement is: Fat layer exposed. Post procedure Diagnosis Wound #3: Same as Pre-Procedure General Notes: Scribed for Dr. Lady Gary by Zenaida Deed, RN. Plan Follow-up Appointments: Return Appointment in 1 week. - Dr. Lady Gary - Room 1 with [facility] Friday 4/24 @ 08:15 am Anesthetic: (In clinic) Topical Lidocaine 4% applied to wound bed Bathing/ Shower/ Hygiene: May shower and wash wound with soap and water. - with dressing changes Negative Presssure Wound Therapy: Wound #3 Left,Lateral,Plantar Foot: Wound Vac to wound continuously at 160mm/hg pressure Black Foam Edema Control - Lymphedema / SCD / Other: Elevate legs to the level of the heart or above for 30 minutes daily and/or when sitting for 3-4 times a day throughout the day. Avoid standing for long periods of time. Exercise regularly Compression stocking or Garment 20-30 mm/Hg pressure to: - to both legs daily Off-Loading: Open toe surgical shoe to: - Both feet, peg assist insert left shoe Additional Orders / Instructions: Follow Nutritious Diet Non Wound Condition: Apply the following to affected area as directed: -  continue to apply foam donut or cushion to healed right plantar foot WOUND #3: - Foot Wound Laterality: Plantar, Left, Lateral Cleanser: Soap and Water 3 x Per Week/30 Days Discharge Instructions: May shower and wash wound with dial antibacterial soap and water prior to dressing change. Cleanser: Vashe 5.8 (oz) 3 x Per Week/30 Days Discharge Instructions: Cleanse the wound with Vashe prior to applying a clean dressing, let sit on wound for 5-10 minutesusing gauze  sponges, not tissue or cotton balls. Peri-Wound Care: Skin Prep (Generic) 3 x Per Week/30 Days Discharge Instructions: Use skin prep as directed Topical: keystone antibiotic compound 3 x Per Week/30 Days Discharge Instructions: apply to base of wound Prim Dressing: Promogran Prisma Matrix, 4.34 (sq in) (silver collagen) (Dispense As Written) 3 x Per Week/30 Days ary Discharge Instructions: Moisten collagen with saline or hydrogel Prim Dressing: VAC 3 x Per Week/30 Days ary Secondary Dressing: Zetuvit Absorbent Pad, 4x4 (in/in) (Dispense As Written) 3 x Per Week/30 Days Secured With: Elastic Bandage 4 inch (ACE bandage) (Generic) 3 x Per Week/30 Days Discharge Instructions: Secure with ACE bandage as directed. Secured With: 73M Medipore Scientist, research (life sciences) Surgical T 2x10 (in/yd) (Generic) 3 x Per Week/30 Days ape Discharge Instructions: Secure with tape as directed. Com pression Wrap: Kerlix Roll 4.5x3.1 (in/yd) (Generic) 3 x Per Week/30 Days Discharge Instructions: Apply Kerlix and Coban compression as directed. 07/20/2022: The depth of the wound has come in again this week. There is some undermining of moist callus and skin around the lateral edge of his wound. No odor and the surface of his wound appears robust and healthy. I used scissors and forceps to trim away the overhanging callus and skin. I then used a curette to debride slough and biofilm from the wound surface. We will continue to use his Keystone topical antibiotic compound  (prescription drug) along with Prisma silver collagen and the wound VAC. Continue peg assist shoe for offloading. Follow-up in 1 week. [place], [name] ([id number]) [id number] Page 20 of 22 Electronic Signature(s) Signed: 07/20/2022 4:25:22 PM By: Duanne Guess MD FACS Signed: 07/20/2022 5:05:24 PM By: Zenaida Deed RN, BSN Previous Signature: 07/20/2022 9:08:07 AM Version By: Duanne Guess MD FACS Entered By: Zenaida Deed on 07/20/2022 16:14:21 -------------------------------------------------------------------------------- HxROS Details Patient Name: Date of Service: [name], [name] 07/20/2022 8:15 A M Medical Record Number: [id number] Patient Account Number: [id number] Date of Birth/Sex: Treating RN: 11/03/1985 (36 y.o. [name] Primary Care Provider: [name] Other Clinician: Referring Provider: Treating Provider/Extender: [name] in Treatment: 142 Information Obtained From Patient Eyes Medical History: Negative for: Cataracts; Glaucoma; Optic Neuritis Ear/Nose/Mouth/Throat Medical History: Negative for: Chronic sinus problems/congestion; Middle ear problems Hematologic/Lymphatic Medical History: Negative for: Anemia; Hemophilia; Human Immunodeficiency Virus; Lymphedema; Sickle Cell Disease Respiratory Medical History: Negative for: Aspiration; Asthma; Chronic Obstructive Pulmonary Disease (COPD); Pneumothorax; Sleep Apnea; Tuberculosis Cardiovascular Medical History: Negative for: Angina; Arrhythmia; Congestive Heart Failure; Coronary Artery Disease; Deep Vein Thrombosis; Hypertension; Hypotension; Myocardial Infarction; Peripheral Arterial Disease; Peripheral Venous Disease; Phlebitis; Vasculitis Gastrointestinal Medical History: Negative for: Cirrhosis ; Colitis; Crohns; Hepatitis A; Hepatitis B; Hepatitis C Endocrine Medical History: Positive for: Type II Diabetes Negative for: Type I  Diabetes Time with diabetes: 8 Treated with: Insulin Blood sugar tested every day: No Immunological Medical History: Negative for: Lupus Erythematosus; Raynauds; Scleroderma Integumentary (Skin) Medical History: Negative for: History of Burn Musculoskeletal Medical History: Negative for: Gout; Rheumatoid Arthritis; Osteoarthritis; Osteomyelitis [place], [name] ([id number]) [id number] Page 21 of 22 Neurologic Medical History: Negative for: Dementia; Neuropathy; Quadriplegia; Paraplegia; Seizure Disorder Oncologic Medical History: Negative for: Received Chemotherapy; Received Radiation Psychiatric Medical History: Negative for: Anorexia/bulimia; Confinement Anxiety Immunizations Pneumococcal Vaccine: Received Pneumococcal Vaccination: No Implantable Devices None Hospitalization / Surgery History Type of Hospitalization/Surgery 11/1-11/06/2018- sepsis foot infection 11/4-11/5 02 sats low respiratory distress Family and Social History Cancer: No; Diabetes: No; Hereditary Spherocytosis: No; Hypertension: No; Kidney Disease: No; Lung Disease: No; Seizures: No; Stroke: No; Thyroid Problems: No; Tuberculosis:  No; Never smoker; Marital Status - Single; Alcohol Use: Rarely; Drug Use: No History; Caffeine Use: Never; Financial Concerns: No; Food, Clothing or Shelter Needs: No; Support System Lacking: No; Transportation Concerns: No Electronic Signature(s) Signed: 07/20/2022 1:04:04 PM By: Duanne Guess MD FACS Signed: 07/20/2022 5:05:24 PM By: Zenaida Deed RN, BSN Entered By: Duanne Guess on 07/20/2022 09:06:21 -------------------------------------------------------------------------------- SuperBill Details Patient Name: Date of Service: [name], [name] 07/20/2022 Medical Record Number: [id number] Patient Account Number: [id number] Date of Birth/Sex: Treating RN: 09-Nov-1985 (36 y.o. [name] Primary Care Provider: [name] Other  Clinician: Referring Provider: Treating Provider/Extender: [name] in Treatment: 142 Diagnosis Coding ICD-10 Codes Code Description [phone] Non-pressure chronic ulcer of other part of left foot with other specified severity E11.621 Type 2 diabetes mellitus with foot ulcer Facility Procedures : CPT4 Code: [id number] Description: [phone] - [facility]BR[facility] WOUND 1ST 20 SQ CM OR < ICD-10 Diagnosis Description L97.528 Non-pressure chronic ulcer of other part of left foot with other specified severi Modifier: ty Quantity: 1 Physician Procedures : CPT4 Code Description Modifier [id number] - WC PHYS LEVEL 4 - EST PT 25 ICD-10 Diagnosis Description L97.528 Non-pressure chronic ulcer of other part of left foot with other specified severity E11.621 Type 2 diabetes mellitus with foot ulcer Quantity: 1 : [id number] - WC PHYS DEBR WO ANESTH 20 SQ CM [place], [name] ([id number]) [id number] ICD-10 Diagnosis Description L97.528 Non-pressure chronic ulcer of other part of left foot with other specified severity Quantity: 1 27.pdf Page 22 of 22 Electronic Signature(s) Signed: 07/20/2022 4:25:22 PM By: Duanne Guess MD FACS Signed: 07/20/2022 5:05:24 PM By: Zenaida Deed RN, BSN Previous Signature: 07/20/2022 9:08:26 AM Version By: Duanne Guess MD FACS Entered By: Zenaida Deed on 07/20/2022 16:14:52

## 2022-07-27 ENCOUNTER — Encounter (HOSPITAL_BASED_OUTPATIENT_CLINIC_OR_DEPARTMENT_OTHER): Payer: BC Managed Care – PPO | Admitting: General Surgery

## 2022-07-27 DIAGNOSIS — E11621 Type 2 diabetes mellitus with foot ulcer: Secondary | ICD-10-CM | POA: Diagnosis not present

## 2022-07-28 NOTE — Progress Notes (Signed)
[place], [name] ([id number]) [id number] Page 1 of 22 Visit Report for 07/27/2022 Chief Complaint Document Details Patient Name: Date of Service: [name] 07/27/2022 8:15 A M Medical Record Number: [id number] Patient Account Number: [id number] Date of Birth/Sex: Treating RN: May 24, 1985 (37 y.o. M) Primary Care Provider: [name] Other Clinician: Referring Provider: Treating Provider/Extender: [name] in Treatment: [phone] Information Obtained from: Patient Chief Complaint 01/11/2019; patient is here for review of a rather substantial wound over the left fifth plantar metatarsal head extending into the lateral part of his foot 10/24/2019; patient returns to clinic with wounds on his bilateral feet with underlying osteomyelitis biopsy-proven Electronic Signature(s) Signed: 07/27/2022 8:55:46 AM By: Duanne Guess MD FACS Entered By: Duanne Guess on 07/27/2022 08:55:45 -------------------------------------------------------------------------------- Debridement Details Patient Name: Date of Service: [name], [name] 07/27/2022 8:15 A M Medical Record Number: [id number] Patient Account Number: [id number] Date of Birth/Sex: Treating RN: Dec 25, 1985 (37 y.o. [name] Primary Care Provider: [name] Other Clinician: Referring Provider: Treating Provider/Extender: [name] in Treatment: 143 Debridement Performed for Assessment: Wound #13 Left Knee Performed By: Physician Duanne Guess, MD Debridement Type: Debridement Severity of Tissue Pre Debridement: Fat layer exposed Level of Consciousness (Pre-procedure): Awake and Alert Pre-procedure Verification/Time Out Yes - 08:45 Taken: Start Time: 08:45 Pain Control: Lidocaine 4% Topical Solution Percent of Wound Bed Debrided: 100% T Area Debrided (cm): otal 1.3 Tissue and other material debrided: Non-Viable, Eschar, Slough,  Slough Level: Non-Viable Tissue Debridement Description: Selective/Open Wound Instrument: Curette Bleeding: Minimum Hemostasis Achieved: Pressure Procedural Pain: 0 Post Procedural Pain: 0 Response to Treatment: Procedure was tolerated well Level of Consciousness (Post- Awake and Alert procedure): Post Debridement Measurements of Total Wound Length: (cm) 0.5 Width: (cm) 3.3 Depth: (cm) 0.1 Volume: (cm) 0.13 Character of Wound/Ulcer Post Debridement: Improved Severity of Tissue Post Debridement: Fat layer exposed Post Procedure Diagnosis Same as Pre-procedure Notes Scribed for Dr. Lady Gary by Zenaida Deed, RN [place], [name] ([id number]) [id number] Page 2 of 22 Electronic Signature(s) Signed: 07/27/2022 9:50:47 AM By: Duanne Guess MD FACS Signed: 07/27/2022 4:40:27 PM By: Zenaida Deed RN, BSN Entered By: Zenaida Deed on 07/27/2022 08:52:33 -------------------------------------------------------------------------------- Debridement Details Patient Name: Date of Service: [name], [name] 07/27/2022 8:15 A M Medical Record Number: [id number] Patient Account Number: [id number] Date of Birth/Sex: Treating RN: 09-13-1985 (37 y.o. [name] Primary Care Provider: [name] Other Clinician: Referring Provider: Treating Provider/Extender: [name] in Treatment: 143 Debridement Performed for Assessment: Wound #3 Left,Lateral,Plantar Foot Performed By: Physician Duanne Guess, MD Debridement Type: Debridement Severity of Tissue Pre Debridement: Fat layer exposed Level of Consciousness (Pre-procedure): Awake and Alert Pre-procedure Verification/Time Out Yes - 08:45 Taken: Start Time: 08:45 Pain Control: Lidocaine 4% Topical Solution Percent of Wound Bed Debrided: 100% T Area Debrided (cm): otal 6.48 Tissue and other material debrided: Non-Viable, Callus, Biofilm Level: Non-Viable  Tissue Debridement Description: Selective/Open Wound Instrument: Curette Bleeding: Minimum Hemostasis Achieved: Pressure Procedural Pain: 0 Post Procedural Pain: 0 Response to Treatment: Procedure was tolerated well Level of Consciousness (Post- Awake and Alert procedure): Post Debridement Measurements of Total Wound Length: (cm) 2.5 Width: (cm) 3.3 Depth: (cm) 1.1 Volume: (cm) 7.127 Character of Wound/Ulcer Post Debridement: Improved Severity of Tissue Post Debridement: Fat layer exposed Post Procedure Diagnosis Same as Pre-procedure Notes Scribed for Dr. Lady Gary by Zenaida Deed, RN Electronic Signature(s) Signed: 07/27/2022 9:50:47 AM By: Duanne Guess MD FACS Signed: 07/27/2022 4:40:27 PM By: Zenaida Deed RN,  BSN Entered By: Zenaida Deed on 07/27/2022 08:55:56 -------------------------------------------------------------------------------- HPI Details Patient Name: Date of Service: [name] 07/27/2022 8:15 A M Medical Record Number: [id number] Patient Account Number: [id number] Date of Birth/Sex: Treating RN: 05/15/1985 (37 y.o. 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 He comes in for review of a wound over the plantar fifth metatarsal head extending into the lateral part of the foot. He was followed for this previously by his podiatrist Dr. Darreld Mclean. As the patient tells his story he went to see podiatry first for a swelling he developed on the lateral part of his fifth metatarsal head in May. He states this was "open" by podiatry and the area closed. He was followed up in June and it was again opened callus  removed and it closed promptly. There were plans being made for surgery on the fifth metatarsal head in June however his blood sugar was apparently too high for anesthesia. Apparently the area was debrided and opened again in June and it is never closed since. Looking over the records from podiatry I am really not able to follow this. It was clear when he was first seen it was before 5/14 at that point he already had a wound. By 5/17 the ulcer was resolved. I do not see anything about a procedure. On 5/28 noted to have pre-ulcerative moderate keratosis. X-ray noted 1/5 contracted toe and tailor's bunion and metatarsal deformity. On a visit date on 09/28/2018 the dorsal part of the left foot it healed and resolved. There was concern about swelling in his lower extremity he was sent to the ER.. As far as I can tell he was seen in the ER on 7/12 with an ulcer on his left foot. A DVT rule out of the left leg was negative. I do not think I have complete records from podiatry but I am not able to verify the procedures this patient states he had. He states after the last procedure the wound has never closed although I am not able to follow this in the records I have from podiatry. He has not had a recent x-ray The patient has been using Neosporin on the wound. He is wearing a Darco shoe. He is still very active up on his foot working and exercising. Past medical history; type 2 diabetes ketosis-prone, leg swelling with a negative DVT study in July. Non-smoker ABI in our clinic was 0.85 on the left 10/16; substantial wound on the plantar left fifth met head extending laterally almost to the dorsal fifth MTP. We have been using silver alginate we gave him a Darco forefoot off loader. An x-ray did not show evidence of osteomyelitis did note soft tissue emphysema which I think was due to gas tracking through an open wound. There is no doubt in my mind he requires an MRI 10/23; MRI not booked until 3 November at  the earliest this is largely due to his glucose sensor in the right arm. We have been using silver alginate. There has been an improvement 10/29; I am still not exactly sure when his MRI is booked for. He says it is the third but it is the 10th in epic. This definitely needs to be done. He is running a  low-grade fever today but no other symptoms. No real improvement in the 1 02/26/2019 patient presents today for a follow-up visit here in our clinic he is last been seen in the clinic on October 29. Subsequently we were working on getting MRI to evaluate and see what exactly was going on and where we would need to go from the standpoint of whether or not he had osteomyelitis and again what treatments were going be required. Subsequently the patient ended up being admitted to the hospital on 02/07/2019 and was discharged on 02/14/2019. This is a somewhat interesting admission with a discharge diagnosis of pneumonia due to COVID-19 although he was positive for COVID-19 when tested at the urgent care but negative x2 when he was actually in the hospital. With that being said he did have acute respiratory failure with hypoxia and it was noted he also have a left foot ulceration with osteomyelitis. With that being said he did require oxygen for his pneumonia and I level 4 L. He was placed on antivirals and steroids for the COVID-19. He was also transferred to the [facility] at one point. Nonetheless he did subsequently discharged home and since being home has done much better in that regard. The CT angiogram did not show any pulmonary embolism. With regard to the osteomyelitis the patient was placed on vancomycin and Zosyn while in the hospital but has been changed to Augmentin at discharge. It was also recommended that he follow- up with wound care and podiatry. Podiatry however wanted him to see [name] according to the patient prior to them doing anything further. His hemoglobin A1c was 9.9 as noted in  the hospital. Have an MRI of the left foot performed while in the hospital on 02/04/2019. This showed evidence of septic arthritis at the fifth MTP joint and osteomyelitis involving the fifth metatarsal head and proximal phalanx. There is an overlying plantar open wound noted an abscess tracking back along the lateral aspect of the fifth metatarsal shaft. There is otherwise diffuse cellulitis and mild fasciitis without findings of polymyositis. The patient did have recently pneumonia secondary to COVID-19 I looked in the chart through epic and it does appear that the patient may need to have an additional x-ray just to ensure everything is cleared and that he has no airspace disease prior to putting him into the chamber. 03/05/2019; patient was readmitted to the clinic last week. He was hospitalized twice for a viral upper respiratory tract infection from 11/1 through 11/4 and then 11/5 through 11/12 ultimately this turned out to be Covid pneumonitis. Although he was discharged on oxygen he is not using it. He says he feels fine. He has no exercise limitation no cough no sputum. His O2 sat in our clinic today was 100% on room air. He did manage to have his MRI which showed septic arthritis at the fifth MTP joint and osteomyelitis involving the fifth metatarsal head and proximal phalanx. He received Vanco and Zosyn in the hospital and then was discharged on 2 weeks of Augmentin. I do not see any relevant cultures. He was supposed to follow-up with infectious disease but I do not see that he has an appointment. 12/8; patient saw Dr. Ronnie Derby of infectious disease last week. He felt that he had had adequate antibiotic therapy. He did not go to follow-up with Dr. Logan Bores of podiatry and I have again talked to him about the pros and cons of this. He does not want to consider a ray amputation of  this time. He is aware of the risks of recurrence, migration etc. He started HBO today and tolerated this well. He can  complete the Augmentin that I gave him last week. I have looked over the lab work that Dr. Effie Shy ordered his C-reactive protein was 3.3 and his sedimentation rate was 17. The C-reactive protein is never really been measurably that high in this patient 12/15; not much change in the wound today however he has undermining along the lateral part of the foot again more extensively than last week. He has some rims of epithelialization. We have been using silver alginate. He is undergoing hyperbarics but did not dive today 12/18; in for his obligatory first total contact cast change. Unfortunately there was pus coming from the undermining area around his fifth metatarsal head. This was cultured but will preclude reapplication of a cast. He is seen in conjunction with HBO 12/24; patient had staph lugdunensis in the wound in the undermining area laterally last time. We put him on doxycycline which should have covered this. The wound looks better today. I am going to give him another week of doxycycline before reattempting the total contact cast 12/31; the patient is completing antibiotics. Hemorrhagic debris in the distal part of the wound with some undermining distally. He also had hyper granulation. Extensive debridement with a #5 curette. The infected area that was on the lateral part of the fifth met head is closed over. I do not think he needs any more antibiotics. Patient was seen prior to HBO. Preparations for a total contact cast were made in the cast will be placed post hyperbarics 04/11/19; once again the patient arrives today without complaint. He had been in a cast all week noted that he had heavy drainage this week. This resulted in large raised areas of macerated tissue around the wound 1/14; wound bed looks better slightly smaller. Hydrofera Blue has been changing himself. He had a heavy drainage last week which caused a lot of maceration around the wound so I took him out of a total contact  cast he says the drainage is actually better this week He is seen today in conjunction with HBO 1/21; returns to clinic. He was up in [place] for a day or 2 attending a funeral. He comes back in with the wound larger and with a large area of exposed bone. He had osteomyelitis and septic arthritis of the fifth left metatarsal head while he was in hospital. He received IV antibiotics in the hospital for a prolonged period of time then 3 weeks of Augmentin. Subsequently I gave him 2 weeks of doxycycline for more superficial wound infection. When I saw this last week the wound was smaller the surface of the wound looks satisfactory. 1/28; patient missed hyperbarics today. Bone biopsy I did last time showed Enterococcus faecalis and Staphylococcus lugdunensis . He has a wide area of exposed bone. We are going to use silver alginate as of today. I had another ethical discussion with the patient. This would be recurrent osteomyelitis he is already received IV antibiotics. In this situation I think the likelihood of healing this is low. Therefore I have recommended a ray amputation and with the patient's agreement I have referred him to Dr. Victorino Dike. The other issue is that his compliance with hyperbarics has been minimal because of his work schedule and given his underlying decision I am going to stop this today READMISSION 10/24/2019 MRI 09/29/2019 left foot IMPRESSION: 1. Apparent skin ulceration inferior and lateral to  the 5th [place], [name] ([id number]) [id number] Page 4 of 22 metatarsal base with underlying heterogeneous T2 signal and enhancement in the subcutaneous fat. Small peripherally enhancing fluid collections along the plantar and lateral aspects of the 5th metatarsal base suspicious for abscesses. 2. Interval amputation through the mid 5th metatarsal with nonspecific low-level marrow edema and enhancement. Given the proximity to the adjacent soft tissue  inflammatory changes, osteomyelitis cannot be excluded. 3. The additional bones appear unremarkable. MRI 09/29/2019 right foot IMPRESSION: 1. Soft tissue ulceration lateral to the 5th MTP joint. There is low-level T2 hyperintensity within the 4th and 5th metatarsal heads and adjacent proximal phalanges without abnormal T1 signal or cortical destruction. These findings are nonspecific and could be seen with early marrow edema, hyperemia or early osteomyelitis. No evidence of septic joint. 2. Mild tenosynovitis and synovial enhancement associated with the extensor digitorum tendons at the level of the midfoot. 3. Diffuse low-level muscular T2 hyperintensity and enhancement, most consistent with diabetic myopathy. LEFT FOOT BONE Methicillin resistant staphylococcus aureus Staphylococcus lugdunensis MIC MIC CIPROFLOXACIN >=8 RESISTANT Resistant <=0.5 SENSI... Sensitive CLINDAMYCIN <=0.25 SENS... Sensitive >=8 RESISTANT Resistant ERYTHROMYCIN >=8 RESISTANT Resistant >=8 RESISTANT Resistant GENTAMICIN <=0.5 SENSI... Sensitive <=0.5 SENSI... Sensitive Inducible Clindamycin NEGATIVE Sensitive NEGATIVE Sensitive OXACILLIN >=4 RESISTANT Resistant 2 SENSITIVE Sensitive RIFAMPIN <=0.5 SENSI... Sensitive <=0.5 SENSI... Sensitive TETRACYCLINE <=1 SENSITIVE Sensitive <=1 SENSITIVE Sensitive TRIMETH/SULFA <=10 SENSIT Sensitive <=10 SENSIT Sensitive ... Marland Kitchen.. VANCOMYCIN 1 SENSITIVE Sensitive <=0.5 SENSI... Sensitive Right foot bone . Component 3 wk ago Specimen Description BONE Special Requests RIGHT 4 METATARSAL SAMPLE B Gram Stain NO WBC SEEN NO ORGANISMS SEEN Culture RARE METHICILLIN RESISTANT STAPHYLOCOCCUS AUREUS NO ANAEROBES ISOLATED Performed at [facility], [address], [place], [place] [address] Report Status 10/08/2019 FINAL Organism ID, Bacteria METHICILLIN RESISTANT STAPHYLOCOCCUS AUREUS Resulting Agency [facility] Susceptibility Methicillin resistant staphylococcus  aureus MIC CIPROFLOXACIN >=8 RESISTANT Resistant CLINDAMYCIN <=0.25 SENS... Sensitive ERYTHROMYCIN >=8 RESISTANT Resistant GENTAMICIN <=0.5 SENSI... Sensitive Inducible Clindamycin NEGATIVE Sensitive OXACILLIN >=4 RESISTANT Resistant RIFAMPIN <=0.5 SENSI... Sensitive TETRACYCLINE <=1 SENSITIVE Sensitive TRIMETH/SULFA <=10 SENSIT Sensitive ... VANCOMYCIN 1 SENSITIVE Sensitive This is a patient we had in clinic earlier this year with a wound over his left fifth metatarsal head. 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 Victorino Dike on 05/16/2019. He seems to have developed problems with wounds on his bilateral feet in June 2021 although he may have had problems earlier than this. He was in an urgent care with a right foot ulcer on 09/26/2019 and given a course of doxycycline. This was apparently after having trouble getting into see orthopedics. He was seen by podiatry on 09/28/2019 noted to have bilateral lower extremity ulcers including the left lateral fifth metatarsal base and the right subfifth met head. It was noted that had purulent drainage at that time. He required hospitalization from 6/20 through 7/2. This was because of worsening right foot wounds. He underwent bilateral operative incision and drainage and bone biopsies bilaterally. Culture results are listed above. He has been referred back to clinic by Dr. Ardelle Anton of podiatry. He is also followed by Dr. Orvan Falconer who saw him yesterday. He was discharged from hospital on Zyvox Flagyl and Levaquin and yesterday changed to doxycycline Flagyl and Levaquin. His inflammatory  markers on 6/26 showed a sedimentation rate of 129 and a C-reactive protein of 5. This is  improved to 14 and 1.3 respectively. This would indicate improvement. ABIs in our clinic today were 1.23 on the right and 1.20 on the left 11/01/2019 on evaluation today patient appears to be doing fairly well in regard to the wounds on his feet at this point. Fortunately there is no signs of active infection at this time. No fevers, chills, nausea, vomiting, or diarrhea. He currently is seeing infectious disease and still under their care at this point. Subsequently he also has both wounds which she has not been using collagen on as he did not receive that in his packaging he did not call us and let us know that. Apparently that just was missed on the order. Nonetheless we will get that straightened out today. 8/9-Patient returns for bilateral foot wounds, using Prisma with hydrogel moistened dressings, and the wounds appear stable. Patient using surgical shoes, avoiding much pressure or weightbearing as much as possible 8/16; patient has bilateral foot wounds. 1 on the right lateral foot proximally the other is on the left mid lateral foot. Both required debridement of callus and thick skin around the wounds. We have been using silver collagen 8/27; patient has bilateral lateral foot wounds. The area on the left substantially surrounded by callus and dry skin. This was removed from the wound edge. The underlying wound is small. The area on the right measured somewhat smaller today. We've been using silver collagen [place], [name] ([id number]) [id number] Page 5 of 22 the patient was on antibiotics for underlying osteomyelitis in the left foot. Unfortunately I did not update his antibiotics during today's visit. 9/10 I reviewed Dr. Blair Dolphin last notes he felt he had completed antibiotics his inflammatory markers were reasonably well controlled. He has a small wound on the lateral left foot and a tiny area on the right which is just above closed. He is using Hydrofera Blue  with border foam he has bilateral surgical shoes 9/24; 2 week f/u. doing well. right foot is closed. left foot still undermined. 10/14; right foot remains closed at the fifth met head. The area over the base of the left fifth metatarsal has a small open area but considerable undermining towards the plantar foot. Thick callus skin around this suggests an adequate pressure relief. We have talked about this. He says he is going to go back into his cam boot. I suggested a total contact cast he did not seem enamored with this suggestion 10/26; left foot base of the fifth metatarsal. Same condition as last time. He has skin over the area with an open wound however the skin is not adherent. He went to see Dr. Loreta Ave who did an x-ray and culture of his foot I have not reviewed the x-ray but the patient was not told anything. He is on doxycycline 11/11; since the patient was last here he was in the emergency room on 10/30 he was concerned about swelling in the left foot. They did not do any cultures or x-rays. They changed his antibiotics to cephalexin. Previous culture showed group B strep. The cephalexin is appropriate as doxycycline has less than predictable coverage. Arrives in clinic today with swelling over this area under the wound. He also has a new wound on the right fifth metatarsal head 11/18; the patient has a difficult wound on the lateral aspect of the left fifth metatarsal head. The wound was almost ballotable last week I opened it  slightly expecting to see purulence however there was just bleeding. I cultured this this was negative. X-ray unchanged. We are trying to get an MRI but I am not sure were going to be able to get this through his insurance. He also has an area on the right lateral fifth metatarsal head this looks healthier 12/3; the patient finally got our MRI. Surprisingly this did not show osteomyelitis. I did show the soft tissue ulceration at the lateral plantar aspect of the  fifth metatarsal base with a tiny residual 6 mm abscess overlying the superficial fascia I have tried to culture this area I have not been able to get this to grow anything. Nevertheless the protruding tissue looks aggravated. I suspect we should try to treat the underlying "abscess with broad-spectrum antibiotics. I am going to start him on Levaquin and Flagyl. He has much less edema in his legs and I am going to continue to wrap his legs and see him weekly 12/10. I started Levaquin and Flagyl on him last week. He just picked up the Flagyl apparently there was some delay. The worry is the wound on the left fifth metatarsal base which is substantial and worsening. His foot looks like he inverts at the ankle making this a weightbearing surface. Certainly no improvement in fact I think the measurements of this are somewhat worse. We have been using 12/17; he apparently just got the Levaquin yesterday this is 2 weeks after the fact. He has completed the Flagyl. The area over the left fifth metatarsal base still has protruding granulation tissue although it does not look quite as bad as it did some weeks ago. He has severe bilateral lymphedema although we have not been treating him for wounds on his legs this is definitely going to require compression. There was so much edema in the left I did not wish to put him in a total contact cast today. I am going to increase his compression from 3-4 layer. The area on the right lateral fifth met head actually look quite good and superficial. 12/23; patient arrived with callus on the right fifth met head and the substantial hyper granulated callused wound on the base of his fifth metatarsal. He says he is completing his Levaquin in 2 days but I do not think that adds up with what I gave him but I will have to double check this. We are using Hydrofera Blue on both areas. My plan is to put the left leg in a cast the week after New Year's 04/06/2020; patient's wounds  about the same. Right lateral fifth metatarsal head and left lateral foot over the base of the fifth metatarsal. There is undermining on the left lateral foot which I removed before application of total contact cast continuing with Hydrofera Blue new. Patient tells me he was seen by endocrinology today lab work was done [Dr. Kerr]. Also wondering whether he was referred to cardiology. I went over some lab work from previously does not have chronic renal failure certainly not nephrotic range proteinuria he does have very poorly controlled diabetes but this is not his most updated lab work. Hemoglobin A1c has been over 11 1/10; the patient had a considerable amount of leakage towards mid part of his left foot with macerated skin however the wound surface looks better the area on the right lateral fifth met head is better as well. I am going to change the dressing on the left foot under the total contact cast to silver alginate, continue  with Hydrofera Blue on the right. 1/20; patient was in the total contact cast for 10 days. Considerable amount of drainage although the skin around the wound does not look too bad on the left foot. The area on the right fifth metatarsal head is closed. Our nursing staff reports large amount of drainage out of the left lateral foot wound 1/25; continues with copious amounts of drainage described by our intake staff. PCR culture I did last week showed E. coli and Enterococcus faecalis and low quantities. Multiple resistance genes documented including extended spectrum beta lactamase, MRSA, MRSE, quinolone, tetracycline. The wound is not quite as good this week as it was 5 days ago but about the same size 2/3; continues with copious amounts of malodorous drainage per our intake nurse. The PCR culture I did 2 weeks ago showed E. coli and low quantities of Enterococcus. There were multiple resistance genes detected. I put Neosporin on him last week although this does not seem  to have helped. The wound is slightly deeper today. Offloading continues to be an issue here although with the amount of drainage she has a total contact cast is just not going to work 2/10; moderate amount of drainage. Patient reports he cannot get his stocking on over the dressing. I told him we have to do that the nurse gave him suggestions on how to make this work. The wound is on the bottom and lateral part of his left foot. Is cultured predominantly grew low amounts of Enterococcus, E. coli and anaerobes. There were multiple resistance genes detected including extended spectrum beta lactamase, quinolone, tetracycline. I could not think of an easy oral combination to address this so for now I am going to do topical antibiotics provided by [facility] I think the main agents here are vancomycin and an aminoglycoside. We have to be able to give him access to the wounds to get the topical antibiotic on 2/17; moderate amount of drainage this is unchanged. He has his Keystone topical antibiotic against the deep tissue culture organisms. He has been using this and changing the dressing daily. Silver alginate on the wound surface. 2/24; using Keystone antibiotic with silver alginate on the top. He had too much drainage for a total contact cast at one point although I think that is improving and I think in the next week or 2 it might be possible to replace a total contact cast I did not do this today. In general the wound surface looks healthy however he continues to have thick rims of skin and subcutaneous tissue around the wide area of the circumference which I debrided 06/04/2020 upon evaluation today patient appears to be doing well in regard to his wound. I do feel like he is showing signs of improvement. There is little bit of callus and dead tissue around the edges of the wound as well as what appears to be a little bit of a sinus tract that is off to the side laterally I would perform debridement to  clear that away today. 3/17; left lateral foot. The wound looks about the same as I remember. Not much depth surface looks healthy. No evidence of infection 3/25; left lateral foot. Wound surface looks about the same. Separating epithelium from the circumference. There really is no evidence of infection here however not making progress by my view 3/29; left lateral foot. Surface of the wound again looks reasonably healthy still thick skin and subcutaneous tissue around the wound margins. There is no evidence of infection.  One of the concerns being brought up by the nurses has again the amount of drainage vis--vis continued use of a total contact cast 4/5; left lateral foot at roughly the base of the fifth metatarsal. Nice healthy looking granulated tissue with rims of epithelialization. The overall wound measurements are not any better but the tissue looks healthy. The only concern is the amount of drainage although he has no surrounding maceration with what we have been doing recently to absorb fluid and protect his skin. He also has lymphedema. 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 He states that the kerlix/coban under the TCC rolled down and could not get it back up. He has been using an offloading scooter and has somehow hurt his right foot using this device. This happened last week. He states that the side of his right foot developed a blister and opened. The top of his foot also has a few small open wounds he thinks is due to his socks rubbing in his shoes. He has not been using any dressings to the wound. He denies purulent drainage, fever/chills or erythema to the wounds. 4/22; patient presents for 1 week follow-up. He developed new wounds to  the right foot that were evaluated at last clinic visit. He continues to have a total contact cast to the left leg and he reports no issues. He has been using silver collagen to the right foot wounds with no issues. He denies purulent drainage, fever/chills or erythema to the right foot wounds. He has no complaints today 4/25; patient presents for 1 week follow-up. He has a total contact cast of the left leg and reports no issues. He has been using silver alginate to the right foot wound. He denies purulent drainage, fever/chills or erythema to the right foot wounds. 5/2 patient presents for 1 week follow-up. T contact cast on the left. The wound which is on the base of the plantar foot at the base of the fifth metatarsal [name], [name] ([id number]) [id number] Page 6 of 22 actually looks quite good and dimensions continue to gradually contract. HOWEVER the area on the right lateral fifth metatarsal head is much larger than what I remember from 2 weeks ago. Once more is he has significant levels of hypergranulation. Noteworthy that he had this same hyper granulated response on his wound on the left foot at one point in time. So much so that he I thought there was an underlying fluid collection. Based on this I think this just needs debridement. 5/9; the wound on the left actually continues to be gradually smaller with a healthy surface. Slight amount of drainage and maceration of the skin around but not too bad. However he has a large wound over the right fifth metatarsal head very much in the same configuration as his left foot wound was initially. I used silver nitrate to address the hyper granulated tissue no mechanical debridement 5/16; area on the left foot did not look as healthy this week deeper thick surrounding macerated skin and subcutaneous tissue. The area on the right foot fifth met head was about the same The area on the right ankle that we identified  last week is completely broken down into an open wound presumably a  stocking rubbing issue 5/23; patient has been using a total contact cast to the left side. He has been using silver alginate underneath. He has also been using silver alginate to the right foot wounds. He has no complaints today. He denies any signs of infection. 5/31; the left-sided wound looks some better measure smaller surface granulation looks better. We have been using silver alginate under the total contact cast The large area on his right fifth met head and right dorsal foot look about the same still using silver alginate 6/6; neither side is good as I was hoping although the surface area dimensions are better. A lot of maceration on his left and right foot around the wound edge. Area on the dorsal right foot looks better. He says he was traveling. I am not sure what does the amount of maceration around the plantar wounds may be drainage issues 6/13; in general the wound surfaces look quite good on both sides. Macerated skin and raised edges around the wound required debridement although in general especially on the left the surface area seems improved. The area on the right dorsal ankle is about the same I thought this would not be such a problem to close 6/20; not much change in either wound although the one on the right looks a little better. Both wounds have thick macerated edges to the skin requiring debridements. We have been using silver alginate. The area on the dorsal right ankle is still open I thought this would be closed. 6/28; patient comes in today with a marked deterioration in the right foot wound fifth met head. Wide area of exposed bone this is a drastic change from last time. The area on the left there we have been casting is stagnant. We have been using silver alginate in both wound areas. 7/5; bone culture I did for PCR last time was positive for Pseudomonas, group B strep, Enterococcus and Staph aureus.  There was no suggestion of methicillin resistance or ampicillin resistant genes. This was resistant to tetracycline however He comes into the clinic today with the area over his right plantar fifth metatarsal head which had been doing so well 2 weeks ago completely necrotic feeling bone. I do not know that this is going to be salvageable. The left foot wound is certainly no smaller but it has a better surface and is superficial. 7/8; patient called in this morning to say that his total contact cast was rubbing against his foot. He states he is doing fine overall. He denies signs of infection. 7/12; continued deterioration in the wound over the right fifth metatarsal head crumbling bone. This is not going to be salvageable. The patient agrees and wants to be referred to Dr. Victorino Dike which we will attempt to arrange as soon as possible. I am going to continue him on antibiotics as long as that takes so I will renew those today. The area on the left foot which is the base of the fifth metatarsal continues to look somewhat better. Healthy looking tissue no depth no debridement is necessary here. 7/20; the patient was kindly seen by Dr. Victorino Dike of orthopedics on 10/19/2020. 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 Towards this end we have taken him out of the total contact cast on the left we will put him in a wrap with Hydrofera Blue. As I understand things surgery is planned for  7/21 7/27; patient had his surgery last Thursday. 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 Apparently everything went well we did not still disturb that today The area on the left foot actually looks quite good. He has been much less mobile which probably explains this he did not seem to do well in the total contact cast secondary to drainage and maceration I think. We have been using Hydrofera Blue 11/09/2020 upon evaluation today patient  appears to be doing well with regard to his plantar foot ulcer on the left foot. Fortunately there is no evidence of active infection at this time. No fevers, chills, nausea, vomiting, or diarrhea. Overall I think that he is actually doing extremely well. Nonetheless I do believe that he is staying off of this more following the surgery in his right foot that is the reason the left is doing so great. 8/16; left plantar foot wound. This looks smaller than the last time I saw this he is using Hydrofera Blue. The surgical wound on the right foot is being followed by Dr. Victorino Dike we did not look at this today. He has surgical shoes on both feet 8/23; left plantar foot wound not as good this week. Surrounding macerated skin and subcutaneous tissue everything looks moist and wet. I do not think he is offloading this adequately. He is using a surgical shoe Apparently the right foot surgical wound is not open although I did not check his foot 8/31; left plantar foot lateral aspect. Much improved this week. He has no maceration. Some improvement in the surface area of the wound but most impressively the depth is come in we are using silver alginate. The patient is a Veterinary surgeon. He is asked that we write him a letter so he can go back to work. I have also tried to see if we can write something that will allow him to limit the amount of time that he is on his foot at work. 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 Hopefully the latter can be avoided 9/6; I believe the patient missed an appointment last week. He arrives in today with a wound looking roughly the same certainly no better. Undermining laterally and also inferiorly. We used molecuLight today in training with the patient's permission.. We are using silver alginate 9/21 wound is measuring bigger this week although this may have to do with the aggressive  circumferential debridement last week in response to the blush fluorescence on the MolecuLight. Culture I did last week showed significant MSSA and E. coli. I put him on Augmentin but he has not started it yet. We are also going to send this for compounded antibiotics at [facility]. There is no evidence of systemic infection 9/29; silver alginate. His Keystone arrived. He is completing Augmentin in 2 days. Offloading in a cam boot. Moderate drainage per our intake staff 10/5; using silver alginate. He has been using his [place]. He has completed his Augmentin. Per our intake nurse still a lot of drainage, far too much to consider a total contact cast. Wound measures about the same. He had the same undermining area that I defined last week from a roughly 11-3. I remove this today 10/12; using silver alginate he is using the [place]. He comes in for a nurse visit hence we are applying Jodie Echevaria twice a week. Measuring slightly better today and less notable drainage. Extensive debridement of the wound  edge last time 10/18; using topical Keystone and silver alginate and a soft cast. Wound measurements about the same. Drainage was through his soft cast. We are changing this twice a week Tuesdays and Friday 10/25; comes in with moderate drainage. Still using Keystone silver alginate and a soft cast. Wound dimensions completely the same.He has a lot of edema in the left leg he has lymphedema. Asking for [name] to consider wrapping him as he cannot get his stocking on over the soft cast 11/2; comes in with moderate to large drainage slightly smaller in terms of width we have been using [place]. His wound looks satisfactory but not much improvement 11/4; patient presents today for obligatory cast change. Has no issues or complaints today. He denies signs of infection. 11/9; patient traveled this weekend to [place], was on the cast quite a bit. Staining of the cast with black material from his walking boot. Drainage  was not quite as bad as we feared. Using silver alginate and Keystone 11/16; we do not have size for cast therefore we have been putting a soft cast on him since the change on Friday. Still a significant amount of drainage necessitating changing twice a week. We have been using the [facility] at cast changes either hard or soft as well as silver alginate [place], [name] ([id number]) [id number] Page 7 of 22 Comes in the clinic with things actually looking fairly good improvement in width. He says his offloading is about the same 02/24/2021 upon evaluation today patient actually comes back in and is doing excellent in regard to his foot ulcer this is significantly smaller even compared to the last visit. The soft cast seems to have done extremely well for him which is great news. I do not see any signs of infection minimal debridement will be needed today. 11/30; left lateral foot much improved half a centimeter improvement in surface area. No evidence of infection. He seems to be doing better with the soft cast in the TCC therefore we will continue with this. He comes back in later in the week for a change with the nurses. This is due to drainage 12/6; no improvement in dimensions. Under illumination some debris on the surface we have been using silver alginate, soft cast. If there is anything optimistic here he seems to have have less drainage 12/13. Dimensions are improved both length and width and slightly in depth. Appears to be quite healthy today. Raised edges of this thick skin and callus around the edges however. He is in a soft cast were bringing him back once for a change on Friday. Drainage is better 12/20. Dimensions are improved. He still has raised edges of thick skin and callus around the edges. We are using a soft cast 12/28; comes in today with thick callus around the wound. Using silver under alginate under a soft cast. I do not think there is much  improvement in any measurement 2023 04/06/2021; patient was put in a total contact cast. Unfortunately not much change in surface area 1/10; not much different still thick callus and skin around the edge in spite of the total contact cast. This was just debrided last week we have been using the [facility] compounded antibiotic and silver alginate under a total contact cast 1/18 the patient's wound on the left side is doing nicely. smaller HOWEVER he comes in today with a wound on the right foot laterally. blister most likely serosangquenous drainage 1/24; the patient continues to do well in terms of the plantar  left foot which is continued to contract using silver alginate under the total contact cast HOWEVER the right lateral foot is bigger with denuded skin around the edges. I used pickups and a #15 scalpel to remove this this looks like the remanence of a large blister. Cannot rule out infection. Culture in this area I did last week showed Staphylococcus lugdunensis few colonies. I am going to try to address this with his Jodie Echevaria antibiotic that is done so well on the left having linezolid and this should cover the staph 2/1; the patient's wound on his left foot which was the original plantar foot wound thick skin and eschar around the edges even in the total contact cast but the wound surface does not look too bad The real problem is on how his right lateral foot at roughly the base of the fifth metatarsal. The wound is completely necrotic more worrisome than that there is swelling around the edges of this. We have been using silver alginate on both wounds and Keystone on the right foot. Unfortunately I think he is going to require systemic antibiotics while we await cultures. He did not get the x-ray done that we ordered last week [lost the prescription 2/7; disappointingly in the area on the left foot which we are treating with a total contact cast is still not closed although it is much smaller.  He continues to have a lot of callus around the wound edge. -Right lateral foot culture I did last week was negative x-ray also negative for osteomyelitis. 2/15: TCC silver alginate on the left and silver alginate on the right lateral. No real improvement in either area 05/26/2021: T oday, the wounds are roughly the same size as at his previous visit, post-debridement. He continues to endorse fairly substantial drainage, particularly on the right. He has been in a total contact cast on the left. There is still some callus surrounding this lesion. On the right, the periwound skin is quite macerated, along with surrounding callus. The center of the right-sided wound also has some dark, densely adherent material, which is very difficult to remove. 06/02/2021: Today, both wounds are slightly smaller. He has been using zinc oxide ointment around the right ulcer and the degree of maceration has improved markedly. There continues to be an area of nonviable tissue in the center of the right sided ulcer. The left-sided wound, which has been in the total contact cast. Appears clean and the degree of callus around it is less than previously. 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 He had to take the stairs and both wounds have increased in size. The left foot, which has been in a total contact cast, has developed a tunnel tracking to the lateral aspect of his foot. The nonviable tissue in the center of the right-sided ulcer remains recalcitrant to debridement. There is significant undermining surrounding the entirety of the left sided wound. 06/16/2021: The elevator at school has been fixed and the patient has been able to avoid putting as much weight on his wounds over the past week. We converted the left foot wound into a single lesion today, but despite this, the wound is actually smaller. The base is healthy with limited periwound callus.  On the right, the central necrotic area is still present. He continues to be quite macerated around the right sided wound, despite applying barrier cream. This does, however, have the benefit of softening the callus to make it  more easily removable. 06/23/2021: Today, the left wound is smaller. The lateral aspect that had opened up previously is now closed. The wound base has a healthy bed of granulation tissue and minimal slough. Unfortunately, on the right, the wound is larger and continues to be fairly macerated. He has also reopened the wound at his right ankle. He thinks this is due to the gait he has adopted secondary to his total contact cast and boot. 06/30/2021: T oday, both wounds are a little bit larger. The lateral aspect on the left has remained closed. He continues to have significant periwound maceration. The culture that I took from the right sided wound grew a population of bacteria that is not covered by his current [facility] antibiotic. The center of the right- sided wound continues to appear necrotic with nonviable fat. It probes deeper today, but does not reach bone. 07/07/2021: The periwound maceration is a little bit less today. The right lateral foot wound has some areas that appear more viable and the necrotic center also looks a little bit better. The wound on the dorsal surface of his right foot near the ankle is contracting and the surface appears healthy. The left plantar wound surface looks healthy, but there is some new undermining on the medial portion. He did get his new Keystone antibiotic and began applying that to the right foot wound on Saturday. 07/14/2021: The intake nurse reported substantial drainage from his wounds, but the periwound skin actually looks better than is typical for him. The wound on the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 it healed and resolved. There was concern about swelling in his lower extremity he was sent to the ER.. As far as I can tell he was seen in the ER on 7/12 with an ulcer on his left foot. A DVT rule out of the left leg was negative. I do not think I have complete records from podiatry but I am not able to verify the procedures this patient states he had. He states after the last procedure the wound has never closed although I am not able to follow this in the records I have from podiatry. He has not had a recent x-ray The patient has been using Neosporin on the wound. He is wearing a Darco shoe. He is still very active up on his foot working and exercising. Past medical history; type 2 diabetes ketosis-prone, leg swelling with a negative DVT study in July. Non-smoker ABI in our clinic was 0.85 on the left 10/16; substantial wound on the plantar left fifth met head extending laterally almost to the dorsal fifth MTP. We have been using silver alginate we gave him a Darco forefoot off loader. An x-ray did not show evidence of osteomyelitis did note soft tissue emphysema which I think was due to gas tracking through an open wound. There is no doubt in my  mind he requires an MRI 10/23; MRI not booked until 3 November at the earliest this is largely due to his glucose sensor in the right arm. We have been using silver alginate. There has been an improvement 10/29; I am still not exactly sure when his MRI is booked for. He says it is the third but it is the 10th in epic. This definitely needs to be done. He is running a low-grade fever today but no other symptoms. No real improvement in the 1 02/26/2019 patient presents today for a follow-up visit here in our clinic he is last been seen in the clinic on October 29. Subsequently we were working on getting MRI to evaluate and see what exactly was going on and where we would need to go from the standpoint of whether or not he had osteomyelitis and again what treatments were going be required. Subsequently the patient ended up being admitted to the hospital on 02/07/2019 and was discharged on 02/14/2019. This is a somewhat interesting admission with a discharge diagnosis of pneumonia due to COVID-19 although he was positive for COVID-19 when tested at the urgent care but negative x2 when he was actually in the hospital. With that being said he did have acute respiratory failure with hypoxia and it was noted he also have a left foot ulceration with osteomyelitis. With that being said he did require oxygen for his pneumonia and I level 4 L. He was placed on antivirals and steroids for the COVID-19. He was also transferred to the [facility] campus at one point. Nonetheless he did subsequently discharged home and since being home has done much better in that regard. The CT angiogram did not show any pulmonary embolism. With regard to the osteomyelitis the patient was placed on vancomycin and Zosyn while in the hospital but has been changed to Augmentin at discharge. It was also recommended that he follow- up with wound care and podiatry. Podiatry however wanted him to see [name] according to the patient prior to  them doing anything further. His hemoglobin A1c was 9.9 as noted in the hospital. Have an MRI of the left foot performed while in  the hospital on 02/04/2019. This showed evidence of septic arthritis at the fifth MTP joint and osteomyelitis involving the fifth metatarsal head and proximal phalanx. There is an overlying plantar open wound noted an abscess tracking back along the lateral aspect of the fifth metatarsal shaft. There is otherwise diffuse cellulitis and mild fasciitis without findings of polymyositis. The patient did have recently pneumonia secondary to COVID-19 I looked in the chart through epic and it does appear that the patient may need to have an additional x-ray just to ensure everything is cleared and that he has no airspace disease prior to putting him into the chamber. [place], [name] ([id number]) [id number] Page 13 of 22 03/05/2019; patient was readmitted to the clinic last week. He was hospitalized twice for a viral upper respiratory tract infection from 11/1 through 11/4 and then 11/5 through 11/12 ultimately this turned out to be Covid pneumonitis. Although he was discharged on oxygen he is not using it. He says he feels fine. He has no exercise limitation no cough no sputum. His O2 sat in our clinic today was 100% on room air. He did manage to have his MRI which showed septic arthritis at the fifth MTP joint and osteomyelitis involving the fifth metatarsal head and proximal phalanx. He received Vanco and Zosyn in the hospital and then was discharged on 2 weeks of Augmentin. I do not see any relevant cultures. He was supposed to follow-up with infectious disease but I do not see that he has an appointment. 12/8; patient saw Dr. Ronnie Derby of infectious disease last week. He felt that he had had adequate antibiotic therapy. He did not go to follow-up with Dr. Logan Bores of podiatry and I have again talked to him about the pros and cons of this. He does not want to  consider a ray amputation of this time. He is aware of the risks of recurrence, migration etc. He started HBO today and tolerated this well. He can complete the Augmentin that I gave him last week. I have looked over the lab work that Dr. Effie Shy ordered his C-reactive protein was 3.3 and his sedimentation rate was 17. The C-reactive protein is never really been measurably that high in this patient 12/15; not much change in the wound today however he has undermining along the lateral part of the foot again more extensively than last week. He has some rims of epithelialization. We have been using silver alginate. He is undergoing hyperbarics but did not dive today 12/18; in for his obligatory first total contact cast change. Unfortunately there was pus coming from the undermining area around his fifth metatarsal head. This was cultured but will preclude reapplication of a cast. He is seen in conjunction with HBO 12/24; patient had staph lugdunensis in the wound in the undermining area laterally last time. We put him on doxycycline which should have covered this. The wound looks better today. I am going to give him another week of doxycycline before reattempting the total contact cast 12/31; the patient is completing antibiotics. Hemorrhagic debris in the distal part of the wound with some undermining distally. He also had hyper granulation. Extensive debridement with a #5 curette. The infected area that was on the lateral part of the fifth met head is closed over. I do not think he needs any more antibiotics. Patient was seen prior to HBO. Preparations for a total contact cast were made in the cast will be placed post hyperbarics 04/11/19; once again the patient arrives today without complaint. He  had been in a cast all week noted that he had heavy drainage this week. This resulted in large raised areas of macerated tissue around the wound 1/14; wound bed looks better slightly smaller. Hydrofera Blue has  been changing himself. He had a heavy drainage last week which caused a lot of maceration around the wound so I took him out of a total contact cast he says the drainage is actually better this week He is seen today in conjunction with HBO 1/21; returns to clinic. He was up in [place] for a day or 2 attending a funeral. He comes back in with the wound larger and with a large area of exposed bone. He had osteomyelitis and septic arthritis of the fifth left metatarsal head while he was in hospital. He received IV antibiotics in the hospital for a prolonged period of time then 3 weeks of Augmentin. Subsequently I gave him 2 weeks of doxycycline for more superficial wound infection. When I saw this last week the wound was smaller the surface of the wound looks satisfactory. 1/28; patient missed hyperbarics today. Bone biopsy I did last time showed Enterococcus faecalis and Staphylococcus lugdunensis . He has a wide area of exposed bone. We are going to use silver alginate as of today. I had another ethical discussion with the patient. This would be recurrent osteomyelitis he is already received IV antibiotics. In this situation I think the likelihood of healing this is low. Therefore I have recommended a ray amputation and with the patient's agreement I have referred him to Dr. Victorino Dike. The other issue is that his compliance with hyperbarics has been minimal because of his work schedule and given his underlying decision I am going to stop this today READMISSION 10/24/2019 MRI 09/29/2019 left foot IMPRESSION: 1. Apparent skin ulceration inferior and lateral to the 5th metatarsal base with underlying heterogeneous T2 signal and enhancement in the subcutaneous fat. Small peripherally enhancing fluid collections along the plantar and lateral aspects of the 5th metatarsal base suspicious for abscesses. 2. Interval amputation through the mid 5th metatarsal with nonspecific low-level marrow edema and  enhancement. Given the proximity to the adjacent soft tissue inflammatory changes, osteomyelitis cannot be excluded. 3. The additional bones appear unremarkable. MRI 09/29/2019 right foot IMPRESSION: 1. Soft tissue ulceration lateral to the 5th MTP joint. There is low-level T2 hyperintensity within the 4th and 5th metatarsal heads and adjacent proximal phalanges without abnormal T1 signal or cortical destruction. These findings are nonspecific and could be seen with early marrow edema, hyperemia or early osteomyelitis. No evidence of septic joint. 2. Mild tenosynovitis and synovial enhancement associated with the extensor digitorum tendons at the level of the midfoot. 3. Diffuse low-level muscular T2 hyperintensity and enhancement, most consistent with diabetic myopathy. LEFT FOOT BONE Methicillin resistant staphylococcus aureus Staphylococcus lugdunensis MIC MIC CIPROFLOXACIN >=8 RESISTANT Resistant <=0.5 SENSI... Sensitive CLINDAMYCIN <=0.25 SENS... Sensitive >=8 RESISTANT Resistant ERYTHROMYCIN >=8 RESISTANT Resistant >=8 RESISTANT Resistant GENTAMICIN <=0.5 SENSI... Sensitive <=0.5 SENSI... Sensitive Inducible Clindamycin NEGATIVE Sensitive NEGATIVE Sensitive OXACILLIN >=4 RESISTANT Resistant 2 SENSITIVE Sensitive RIFAMPIN <=0.5 SENSI... Sensitive <=0.5 SENSI... Sensitive TETRACYCLINE <=1 SENSITIVE Sensitive <=1 SENSITIVE Sensitive TRIMETH/SULFA <=10 SENSIT Sensitive <=10 SENSIT Sensitive ... Marland Kitchen.. VANCOMYCIN 1 SENSITIVE Sensitive <=0.5 SENSI... Sensitive Right foot bone . Component 3 wk ago Specimen Description BONE Special Requests RIGHT 4 METATARSAL SAMPLE B [place], [name] ([id number]) [id number] Page 14 of 22 Gram Stain NO WBC SEEN NO ORGANISMS SEEN Culture RARE METHICILLIN RESISTANT STAPHYLOCOCCUS AUREUS NO ANAEROBES ISOLATED Performed at [facility]  [facility], [address] [place], [place] [address] Report Status 10/08/2019 FINAL Organism  ID, Bacteria METHICILLIN RESISTANT STAPHYLOCOCCUS AUREUS Resulting Agency [facility] Susceptibility Methicillin resistant staphylococcus aureus MIC CIPROFLOXACIN >=8 RESISTANT Resistant CLINDAMYCIN <=0.25 SENS... Sensitive ERYTHROMYCIN >=8 RESISTANT Resistant GENTAMICIN <=0.5 SENSI... Sensitive Inducible Clindamycin NEGATIVE Sensitive OXACILLIN >=4 RESISTANT Resistant RIFAMPIN <=0.5 SENSI... Sensitive TETRACYCLINE <=1 SENSITIVE Sensitive TRIMETH/SULFA <=10 SENSIT Sensitive ... VANCOMYCIN 1 SENSITIVE Sensitive This is a patient we had in clinic earlier this year with a wound over his left fifth metatarsal head. 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 Victorino Dike on 05/16/2019. He seems to have developed problems with wounds on his bilateral feet in June 2021 although he may have had problems earlier than this. He was in an urgent care with a right foot ulcer on 09/26/2019 and given a course of doxycycline. This was apparently after having trouble getting into see orthopedics. He was seen by podiatry on 09/28/2019 noted to have bilateral lower extremity ulcers including the left lateral fifth metatarsal base and the right subfifth met head. It was noted that had purulent drainage at that time. He required hospitalization from 6/20 through 7/2. This was because of worsening right foot wounds. He underwent bilateral operative incision and drainage and bone biopsies bilaterally. Culture results are listed above. He has been referred back to clinic by Dr. Ardelle Anton of podiatry. He is also followed by Dr. Orvan Falconer who saw him yesterday. He was discharged from hospital on Zyvox Flagyl and Levaquin and yesterday changed  to doxycycline Flagyl and Levaquin. His inflammatory markers on 6/26 showed a sedimentation rate of 129 and a C-reactive protein of 5. This is improved to 14 and 1.3 respectively. This would indicate improvement. ABIs in our clinic today were 1.23 on the right and 1.20 on the left 11/01/2019 on evaluation today patient appears to be doing fairly well in regard to the wounds on his feet at this point. Fortunately there is no signs of active infection at this time. No fevers, chills, nausea, vomiting, or diarrhea. He currently is seeing infectious disease and still under their care at this point. Subsequently he also has both wounds which she has not been using collagen on as he did not receive that in his packaging he did not call us and let us know that. Apparently that just was missed on the order. Nonetheless we will get that straightened out today. 8/9-Patient returns for bilateral foot wounds, using Prisma with hydrogel moistened dressings, and the wounds appear stable. Patient using surgical shoes, avoiding much pressure or weightbearing as much as possible 8/16; patient has bilateral foot wounds. 1 on the right lateral foot proximally the other is on the left mid lateral foot. Both required debridement of callus and thick skin around the wounds. We have been using silver collagen 8/27; patient has bilateral lateral foot wounds. The area on the left substantially surrounded by callus and dry skin. This was removed from the wound edge. The underlying wound is small. The area on the right measured somewhat smaller today. We've been using silver collagen the patient was on antibiotics for underlying osteomyelitis in the left foot. Unfortunately I did not update his antibiotics during today's visit. 9/10 I reviewed Dr. Blair Dolphin last  notes he felt he had completed antibiotics his inflammatory markers were reasonably well controlled. He has a small wound on the lateral left foot and a tiny area on  the right which is just above closed. He is using Hydrofera Blue with border foam he has bilateral surgical shoes 9/24; 2 week f/u. doing well. right foot is closed. left foot still undermined. 10/14; right foot remains closed at the fifth met head. The area over the base of the left fifth metatarsal has a small open area but considerable undermining towards the plantar foot. Thick callus skin around this suggests an adequate pressure relief. We have talked about this. He says he is going to go back into his cam boot. I suggested a total contact cast he did not seem enamored with this suggestion 10/26; left foot base of the fifth metatarsal. Same condition as last time. He has skin over the area with an open wound however the skin is not adherent. He went to see Dr. Loreta Ave who did an x-ray and culture of his foot I have not reviewed the x-ray but the patient was not told anything. He is on doxycycline 11/11; since the patient was last here he was in the emergency room on 10/30 he was concerned about swelling in the left foot. They did not do any cultures or x-rays. They changed his antibiotics to cephalexin. Previous culture showed group B strep. The cephalexin is appropriate as doxycycline has less than predictable coverage. Arrives in clinic today with swelling over this area under the wound. He also has a new wound on the right fifth metatarsal head 11/18; the patient has a difficult wound on the lateral aspect of the left fifth metatarsal head. The wound was almost ballotable last week I opened it slightly expecting to see purulence however there was just bleeding. I cultured this this was negative. X-ray unchanged. We are trying to get an MRI but I am not sure were going to be able to get this through his insurance. He also has an area on the right lateral fifth metatarsal head this looks healthier 12/3; the patient finally got our MRI. Surprisingly this did not show osteomyelitis. I did show the  soft tissue ulceration at the lateral plantar aspect of the fifth metatarsal base with a tiny residual 6 mm abscess overlying the superficial fascia I have tried to culture this area I have not been able to get this to grow anything. Nevertheless the protruding tissue looks aggravated. I suspect we should try to treat the underlying "abscess with broad-spectrum antibiotics. I am going to start him on Levaquin and Flagyl. He has much less edema in his legs and I am going to continue to wrap his legs and see him weekly 12/10. I started Levaquin and Flagyl on him last week. He just picked up the Flagyl apparently there was some delay. The worry is the wound on the left fifth metatarsal base which is substantial and worsening. His foot looks like he inverts at the ankle making this a weightbearing surface. Certainly no improvement in fact I think the measurements of this are somewhat worse. We have been using 12/17; he apparently just got the Levaquin yesterday this is 2 weeks after the fact. He has completed the Flagyl. The area over the left fifth metatarsal base still has protruding granulation tissue although it does not look quite as bad as it did some weeks ago. He has severe bilateral lymphedema although we have not been treating him for  wounds on his legs this is definitely going to require compression. There was so much edema in the left I did not wish to put him in a total contact cast today. I am going to increase his compression from 3-4 layer. The area on the right lateral fifth met head actually look quite good and superficial. 12/23; patient arrived with callus on the right fifth met head and the substantial hyper granulated callused wound on the base of his fifth metatarsal. He says he is completing his Levaquin in 2 days but I do not think that adds up with what I gave him but I will have to double check this. We are using Hydrofera Blue on both areas. My plan is to put the left leg in a  cast the week after New Year's 04/06/2020; patient's wounds about the same. Right lateral fifth metatarsal head and left lateral foot over the base of the fifth metatarsal. There is undermining on the left lateral foot which I removed before application of total contact cast continuing with Hydrofera Blue new. Patient tells me he was seen by endocrinology today lab work was done [Dr. Kerr]. Also wondering whether he was referred to cardiology. I went over some lab work from previously does not have chronic renal failure certainly not nephrotic range proteinuria he does have very poorly controlled diabetes but this is not his most updated lab work. Hemoglobin A1c has been over 11 1/10; the patient had a considerable amount of leakage towards mid part of his left foot with macerated skin however the wound surface looks better the area on the right lateral fifth met head is better as well. I am going to change the dressing on the left foot under the total contact cast to silver alginate, continue with Hydrofera Blue on the right. [place], [name] ([id number]) [id number] Page 15 of 22 1/20; patient was in the total contact cast for 10 days. Considerable amount of drainage although the skin around the wound does not look too bad on the left foot. The area on the right fifth metatarsal head is closed. Our nursing staff reports large amount of drainage out of the left lateral foot wound 1/25; continues with copious amounts of drainage described by our intake staff. PCR culture I did last week showed E. coli and Enterococcus faecalis and low quantities. Multiple resistance genes documented including extended spectrum beta lactamase, MRSA, MRSE, quinolone, tetracycline. The wound is not quite as good this week as it was 5 days ago but about the same size 2/3; continues with copious amounts of malodorous drainage per our intake nurse. The PCR culture I did 2 weeks ago showed E. coli and  low quantities of Enterococcus. There were multiple resistance genes detected. I put Neosporin on him last week although this does not seem to have helped. The wound is slightly deeper today. Offloading continues to be an issue here although with the amount of drainage she has a total contact cast is just not going to work 2/10; moderate amount of drainage. Patient reports he cannot get his stocking on over the dressing. I told him we have to do that the nurse gave him suggestions on how to make this work. The wound is on the bottom and lateral part of his left foot. Is cultured predominantly grew low amounts of Enterococcus, E. coli and anaerobes. There were multiple resistance genes detected including extended spectrum beta lactamase, quinolone, tetracycline. I could not think of an easy oral combination to address this so  for now I am going to do topical antibiotics provided by [name] I think the main agents here are vancomycin and an aminoglycoside. We have to be able to give him access to the wounds to get the topical antibiotic on 2/17; moderate amount of drainage this is unchanged. He has his Keystone topical antibiotic against the deep tissue culture organisms. He has been using this and changing the dressing daily. Silver alginate on the wound surface. 2/24; using Keystone antibiotic with silver alginate on the top. He had too much drainage for a total contact cast at one point although I think that is improving and I think in the next week or 2 it might be possible to replace a total contact cast I did not do this today. In general the wound surface looks healthy however he continues to have thick rims of skin and subcutaneous tissue around the wide area of the circumference which I debrided 06/04/2020 upon evaluation today patient appears to be doing well in regard to his wound. I do feel like he is showing signs of improvement. There is little bit of callus and dead tissue around the edges  of the wound as well as what appears to be a little bit of a sinus tract that is off to the side laterally I would perform debridement to clear that away today. 3/17; left lateral foot. The wound looks about the same as I remember. Not much depth surface looks healthy. No evidence of infection 3/25; left lateral foot. Wound surface looks about the same. Separating epithelium from the circumference. There really is no evidence of infection here however not making progress by my view 3/29; left lateral foot. Surface of the wound again looks reasonably healthy still thick skin and subcutaneous tissue around the wound margins. There is no evidence of infection. One of the concerns being brought up by the nurses has again the amount of drainage vis--vis continued use of a total contact cast 4/5; left lateral foot at roughly the base of the fifth metatarsal. Nice healthy looking granulated tissue with rims of epithelialization. The overall wound measurements are not any better but the tissue looks healthy. The only concern is the amount of drainage although he has no surrounding maceration with what we have been doing recently to absorb fluid and protect his skin. He also has lymphedema. 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 He states that the kerlix/coban under the TCC rolled down and could not get it back up. He has been using an offloading scooter and has somehow hurt his right foot using this device. This happened last week. He states that the side of his right foot developed a blister and opened. The top of his foot also has a few small open wounds he thinks is due to his socks rubbing in his shoes. He has not been using any dressings to the wound. He  denies purulent drainage, fever/chills or erythema to the wounds. 4/22; patient presents for 1 week follow-up. He developed new wounds to the right foot that were evaluated at last clinic visit. He continues to have a total contact cast to the left leg and he reports no issues. He has been using silver collagen to the right foot  wounds with no issues. He denies purulent drainage, fever/chills or erythema to the right foot wounds. He has no complaints today 4/25; patient presents for 1 week follow-up. He has a total contact cast of the left leg and reports no issues. He has been using silver alginate to the right foot wound. He denies purulent drainage, fever/chills or erythema to the right foot wounds. 5/2 patient presents for 1 week follow-up. T contact cast on the left. The wound which is on the base of the plantar foot at the base of the fifth metatarsal otal actually looks quite good and dimensions continue to gradually contract. HOWEVER the area on the right lateral fifth metatarsal head is much larger than what I remember from 2 weeks ago. Once more is he has significant levels of hypergranulation. Noteworthy that he had this same hyper granulated response on his wound on the left foot at one point in time. So much so that he I thought there was an underlying fluid collection. Based on this I think this just needs debridement. 5/9; the wound on the left actually continues to be gradually smaller with a healthy surface. Slight amount of drainage and maceration of the skin around but not too bad. However he has a large wound over the right fifth metatarsal head very much in the same configuration as his left foot wound was initially. I used silver nitrate to address the hyper granulated tissue no mechanical debridement 5/16; area on the left foot did not look as healthy this week deeper thick surrounding macerated skin and subcutaneous tissue. oo The area on the right foot fifth met head was  about the same oo The area on the right ankle that we identified last week is completely broken down into an open wound presumably a stocking rubbing issue 5/23; patient has been using a total contact cast to the left side. He has been using silver alginate underneath. He has also been using silver alginate to the right foot wounds. He has no complaints today. He denies any signs of infection. 5/31; the left-sided wound looks some better measure smaller surface granulation looks better. We have been using silver alginate under the total contact cast oo The large area on his right fifth met head and right dorsal foot look about the same still using silver alginate 6/6; neither side is good as I was hoping although the surface area dimensions are better. A lot of maceration on his left and right foot around the wound edge. Area on the dorsal right foot looks better. He says he was traveling. I am not sure what does the amount of maceration around the plantar wounds may be drainage issues 6/13; in general the wound surfaces look quite good on both sides. Macerated skin and raised edges around the wound required debridement although in general especially on the left the surface area seems improved. oo The area on the right dorsal ankle is about the same I thought this would not be such a problem to close 6/20; not much change in either wound although the one on the right looks a little better. Both wounds have thick macerated edges to the skin requiring debridements. We have been using silver alginate. The area on the dorsal right ankle is still open I thought this would be closed. 6/28; patient comes in today with a marked deterioration in the right foot wound fifth met head. Wide area of exposed bone this is a drastic change from last time. The area on the  left there we have been casting is stagnant. We have been using silver alginate in both wound areas. 7/5; bone culture I did for PCR last time  was positive for Pseudomonas, group B strep, Enterococcus and Staph aureus. There was no suggestion of methicillin resistance or ampicillin resistant genes. This was resistant to tetracycline however He comes into the clinic today with the area over his right plantar fifth metatarsal head which had been doing so well 2 weeks ago completely necrotic feeling bone. I do not know that this is going to be salvageable. The left foot wound is certainly no smaller but it has a better surface and is superficial. 7/8; patient called in this morning to say that his total contact cast was rubbing against his foot. He states he is doing fine overall. He denies signs of infection. 7/12; continued deterioration in the wound over the right fifth metatarsal head crumbling bone. This is not going to be salvageable. The patient agrees and wants to be referred to Dr. Victorino Dike which we will attempt to arrange as soon as possible. I am going to continue him on antibiotics as long as that takes so I will renew those today. The area on the left foot which is the base of the fifth metatarsal continues to look somewhat better. Healthy looking tissue no depth no debridement is necessary here. 7/20; the patient was kindly seen by Dr. Victorino Dike of orthopedics on 10/19/2020. 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 Towards this end we have taken him out of the total contact cast on the left we will put him in a [place], [name] ([id number]) [id number] Page 16 of 22 wrap with Hydrofera Blue. As I understand things surgery is planned for 7/21 7/27; patient had his surgery last Thursday. 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 Apparently everything went well we did not still disturb that today The area on the left foot actually looks quite good. He has been much less mobile which probably explains this he did not  seem to do well in the total contact cast secondary to drainage and maceration I think. 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 Fortunately there is no evidence of active infection at this time. No fevers, chills, nausea, vomiting, or diarrhea. Overall I think that he is actually doing extremely well. Nonetheless I do believe that he is staying off of this more following the surgery in his right foot that is the reason the left is doing so great. 8/16; left plantar foot wound. This looks smaller than the last time I saw this he is using Hydrofera Blue. The surgical wound on the right foot is being followed by Dr. Victorino Dike we did not look at this today. He has surgical shoes on both feet 8/23; left plantar foot wound not as good this week. Surrounding macerated skin and subcutaneous tissue everything looks moist and wet. I do not think he is offloading this adequately. He is using a surgical shoe Apparently the right foot surgical wound is not open although I did not check his foot 8/31; left plantar foot lateral aspect. Much improved this week. He has no maceration. Some improvement in the surface area of the wound but most impressively the depth is come in we are using silver  alginate. The patient is a Veterinary surgeon. He is asked that we write him a letter so he can go back to work. I have also tried to see if we can write something that will allow him to limit the amount of time that he is on his foot at work. 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 Hopefully the latter can be avoided 9/6; I believe the patient missed an appointment last week. He arrives in today with a wound looking roughly the same certainly no better. Undermining laterally and also inferiorly. We used molecuLight today in  training with the patient's permission.. We are using silver alginate 9/21 wound is measuring bigger this week although this may have to do with the aggressive circumferential debridement last week in response to the blush fluorescence on the MolecuLight. Culture I did last week showed significant MSSA and E. coli. I put him on Augmentin but he has not started it yet. We are also going to send this for compounded antibiotics at [facility]. There is no evidence of systemic infection 9/29; silver alginate. His Keystone arrived. He is completing Augmentin in 2 days. Offloading in a cam boot. Moderate drainage per our intake staff 10/5; using silver alginate. He has been using his [place]. He has completed his Augmentin. Per our intake nurse still a lot of drainage, far too much to consider a total contact cast. Wound measures about the same. He had the same undermining area that I defined last week from a roughly 11-3. I remove this today 10/12; using silver alginate he is using the [place]. He comes in for a nurse visit hence we are applying Jodie Echevaria twice a week. Measuring slightly better today and less notable drainage. Extensive debridement of the wound edge last time 10/18; using topical Keystone and silver alginate and a soft cast. Wound measurements about the same. Drainage was through his soft cast. We are changing this twice a week Tuesdays and Friday 10/25; comes in with moderate drainage. Still using Keystone silver alginate and a soft cast. Wound dimensions completely the same.He has a lot of edema in the left leg he has lymphedema. Asking for [name] to consider wrapping him as he cannot get his stocking on over the soft cast 11/2; comes in with moderate to large drainage slightly smaller in terms of width we have been using [place]. His wound looks satisfactory but not much improvement 11/4; patient presents today for obligatory cast change. Has no issues or complaints today. He denies signs  of infection. 11/9; patient traveled this weekend to [place], was on the cast quite a bit. Staining of the cast with black material from his walking boot. Drainage was not quite as bad as we feared. Using silver alginate and Keystone 11/16; we do not have size for cast therefore we have been putting a soft cast on him since the change on Friday. Still a significant amount of drainage necessitating changing twice a week. We have been using the Keystone at cast changes either hard or soft as well as silver alginate Comes in the clinic with things actually looking fairly good improvement in width. He says his offloading is about the same 02/24/2021 upon evaluation today patient actually comes back in and is doing excellent in regard to his foot ulcer this is significantly smaller even compared to the last visit. The soft cast seems to have done extremely well for him  which is great news. I do not see any signs of infection minimal debridement will be needed today. 11/30; left lateral foot much improved half a centimeter improvement in surface area. No evidence of infection. He seems to be doing better with the soft cast in the TCC therefore we will continue with this. He comes back in later in the week for a change with the nurses. This is due to drainage 12/6; no improvement in dimensions. Under illumination some debris on the surface we have been using silver alginate, soft cast. If there is anything optimistic here he seems to have have less drainage 12/13. Dimensions are improved both length and width and slightly in depth. Appears to be quite healthy today. Raised edges of this thick skin and callus around the edges however. He is in a soft cast were bringing him back once for a change on Friday. Drainage is better 12/20. Dimensions are improved. He still has raised edges of thick skin and callus around the edges. We are using a soft cast 12/28; comes in today with thick callus around the wound. Using  silver under alginate under a soft cast. I do not think there is much improvement in any measurement 2023 04/06/2021; patient was put in a total contact cast. Unfortunately not much change in surface area 1/10; not much different still thick callus and skin around the edge in spite of the total contact cast. This was just debrided last week we have been using the [facility] compounded antibiotic and silver alginate under a total contact cast 1/18 the patient's wound on the left side is doing nicely. smaller HOWEVER he comes in today with a wound on the right foot laterally. blister most likely serosangquenous drainage 1/24; the patient continues to do well in terms of the plantar left foot which is continued to contract using silver alginate under the total contact cast HOWEVER the right lateral foot is bigger with denuded skin around the edges. I used pickups and a #15 scalpel to remove this this looks like the remanence of a large blister. Cannot rule out infection. Culture in this area I did last week showed Staphylococcus lugdunensis few colonies. I am going to try to address this with his Jodie Echevaria antibiotic that is done so well on the left having linezolid and this should cover the staph 2/1; the patient's wound on his left foot which was the original plantar foot wound thick skin and eschar around the edges even in the total contact cast but the wound surface does not look too bad The real problem is on how his right lateral foot at roughly the base of the fifth metatarsal. The wound is completely necrotic more worrisome than that there is swelling around the edges of this. We have been using silver alginate on both wounds and Keystone on the right foot. Unfortunately I think he is going to require systemic antibiotics while we await cultures. He did not get the x-ray done that we ordered last week [lost the prescription 2/7; disappointingly in the area on the left foot which we are treating with  a total contact cast is still not closed although it is much smaller. He continues to have a lot of callus around the wound edge. -Right lateral foot culture I did last week was negative x-ray also negative for osteomyelitis. 2/15: TCC silver alginate on the left and silver alginate on the right lateral. No real improvement in either area [place], [name] ([id number]) [id number] Page 17 of 22 05/26/2021:  T oday, the wounds are roughly the same size as at his previous visit, post-debridement. He continues to endorse fairly substantial drainage, particularly on the right. He has been in a total contact cast on the left. There is still some callus surrounding this lesion. On the right, the periwound skin is quite macerated, along with surrounding callus. The center of the right-sided wound also has some dark, densely adherent material, which is very difficult to remove. 06/02/2021: Today, both wounds are slightly smaller. He has been using zinc oxide ointment around the right ulcer and the degree of maceration has improved markedly. There continues to be an area of nonviable tissue in the center of the right sided ulcer. The left-sided wound, which has been in the total contact cast. Appears clean and the degree of callus around it is less than previously. 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 He had to take the stairs and both wounds have increased in size. The left foot, which has been in a total contact cast, has developed a tunnel tracking to the lateral aspect of his foot. The nonviable tissue in the center of the right-sided ulcer remains recalcitrant to debridement. There is significant undermining surrounding the entirety of the left sided wound. 06/16/2021: The elevator at school has been fixed and the patient has been able to avoid putting as much weight on his wounds over the past week. We converted  the left foot wound into a single lesion today, but despite this, the wound is actually smaller. The base is healthy with limited periwound callus. On the right, the central necrotic area is still present. He continues to be quite macerated around the right sided wound, despite applying barrier cream. This does, however, have the benefit of softening the callus to make it more easily removable. 06/23/2021: Today, the left wound is smaller. The lateral aspect that had opened up previously is now closed. The wound base has a healthy bed of granulation tissue and minimal slough. Unfortunately, on the right, the wound is larger and continues to be fairly macerated. He has also reopened the wound at his right ankle. He thinks this is due to the gait he has adopted secondary to his total contact cast and boot. 06/30/2021: T oday, both wounds are a little bit larger. The lateral aspect on the left has remained closed. He continues to have significant periwound maceration. The culture that I took from the right sided wound grew a population of bacteria that is not covered by his current [name] antibiotic. The center of the right- sided wound continues to appear necrotic with nonviable fat. It probes deeper today, but does not reach bone. 07/07/2021: The periwound maceration is a little bit less today. The right lateral foot wound has some areas that appear more viable and the necrotic center also looks a little bit better. The wound on the dorsal surface of his right foot near the ankle is contracting and the surface appears healthy. The left plantar wound surface looks healthy, but there is some new undermining on the medial portion. He did get his new Keystone antibiotic and began applying that to the right foot wound on Saturday. 07/14/2021: The intake nurse reported substantial drainage from his wounds, but the periwound skin actually looks better than is typical for him. The wound on the dorsal surface of  his right foot near the ankle is smaller and just has a small open area underneath some  dried eschar. The left plantar wound surface looks healthy and there has been no significant accumulation of callus. The right lateral foot wound looks quite a bit better, with the central portion, which typically appears necrotic, looking more viable albeit pale. 07/22/2021: His left foot is extremely macerated today. The wound is about the same size. The wound on the dorsal surface of his right foot near the ankle had closed, but he traumatized it removing the dressing and there is a tiny skin tear in that location. The right lateral foot wound is bigger, but the surface appears healthy. 07/30/2021: The wound on the dorsal surface of his right foot near the ankle is closed. The right lateral foot wound again is a little bit bigger due to some undermining. The periwound skin is in better condition, however. He has been applying zinc oxide. The wound surface is a little bit dry today. On the left, he does not have the substantial maceration that we frequently see. The wound itself is smaller and has a clean surface. 08/06/2021: Both wounds seem to have deteriorated over the past week. The right lateral foot wound has a dry surface but the periwound is boggy.. Overall wound dimensions are about the same. On the left, the wound is about the same size, but there is more undermining present underneath periwound callus. 08/13/2021: The right sided wound looks about the same, but on the left there has been substantial deterioration. The undermining continues to extend under periwound callus. Once this was removed, substantial extension of the wound was present. There is no odor or purulent drainage but clearly the wounds have broken down. 08/20/2021: The wounds look about the same today. He has been out of his total contact cast and has just been changing the dressings using topical Keystone with PolyMem Ag, Kerlix and Ace  bandages. The wound on the top of his right ankle has reopened but this is quite small. There was a little bit of purulent material that I expressed when examining this wound. 08/24/2021: After the aggressive debridement I performed at his last visit, the wounds actually look a little bit better today. They are smaller with the exception of the wound on the top of his right ankle which is a little bit bigger as some more skin pulled off when he was changing his dressing. We are using topical Keystone with PolyMem Ag Kerlix and Ace bandages. 09/02/2021: There has been really no change to any of his wounds. 09/16/2021: The patient was hospitalized last week with nausea, vomiting, and dehydration. He says that while he was in the hospital, his wounds were not really addressed properly. T oday, both plantar foot wounds are larger and the periwound skin is macerated. The wound on the dorsum of his right foot has a scab on the top. The right foot now has a crater where previously he had had nonviable fat. It looks as though this simply died and fell out. The periwound callus is wet. 09/24/2021: His wounds have deteriorated somewhat since his last visit. The wound on the dorsum of his right foot near his ankle is larger and has more nonviable tissue present. The crater in his right foot is even deeper; I cannot quite palpate or probe to bone but I am sure it is close. The wound on his left plantar foot has an odd boggy area in the center that almost feels as though it has fluid within it. He has run out of his topical Keystone antibiotic. We are  using silver alginate on his wounds. 09/29/2021: He has developed a new wound on the dorsum of his left foot near his ankle. He says he thinks his wrapping is rubbing in that site. I would concur with this as the wound on his right ankle is larger. The left foot looks about the same. The right foot has the crater that was present last week. No significant slough  accumulation, but his foot remains quite swollen and warm despite oral antibiotic therapy. 10/08/2021: All of his wounds look about the same as last week. He did not start his oral antibiotics that are prescribed until just a couple of days ago; his Jodie Echevaria compounded antibiotics formula has been changed and he is awaiting delivery of the new recipe. His MRI that was scheduled for earlier this week was canceled as no prior authorization had been obtained; unfortunately the tech responsible sent an email to my old [place] email, which I no longer use nor have access to. 10/18/2021: The wounds on his bilateral dorsal feet near the ankles are both improved. They are smaller and have just some eschar and slough buildup. The left plantar wound has a fair amount of undermining, but the surface is clean. There is some periwound callus accumulation. On the right plantar foot, there is nonviable fat leading to a deep tunnel that tracks towards his dorsal medial foot. There is periwound callus and slough accumulation, as well. His right foot and leg remain swollen as compared to the left. 10/25/2021: The wounds on his bilateral dorsal feet and at the ankles have broken down somewhat. They are little bit larger than last week. The left plantar wound continues to undermine laterally but the surface is clean. The right plantar foot wound shows some decreased depth in the tunnel tracking towards his dorsal medial foot. He has not yet had the Doppler study that I ordered; it sounds like there is some confusion about the scheduling of the procedure. In addition, the MRI was denied and I have taken steps to appeal the denial. 11/24/2021: Since our last visit, [name] was admitted to the hospital where an MRI suggested osteomyelitis. He was taken the operating room by podiatry. Bone biopsies were negative for osteomyelitis. They debrided his wounds and applied myriad matrix. He saw them last week and they removed  his staples. He is here today to continue his wound healing process. T oday, both of the dorsal foot/ankle wounds are substantially smaller. There is just a little [place], [name] ([id number]) [id number] Page 18 of 22 eschar overlying the left sided wound and some eschar and slough on the right. The right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

## 2022-07-28 NOTE — Progress Notes (Signed)
[name], [name] ([id number]) [id number] Page 1 of 9 Visit Report for 07/27/2022 Arrival Information Details Patient Name: Date of Service: [name] 07/27/2022 8:15 A M Medical Record Number: [id number] Patient Account Number: [id number] Date of Birth/Sex: Treating RN: [birth year] (36 y.o. [name], [name] Primary Care Cybele Maule: [name] Other Clinician: Referring Jernard Reiber: Treating Quintavis Brands/Extender: [name] in Treatment: 143 Visit Information History Since Last Visit Added or deleted any medications: No Patient Arrived: Ambulatory Any new allergies or adverse reactions: No Arrival Time: 08:26 Had a fall or experienced change in No Accompanied By: self activities of daily living that may affect Transfer Assistance: None risk of falls: Patient Identification Verified: Yes Signs or symptoms of abuse/neglect since last visito No Secondary Verification Process Completed: Yes Hospitalized since last visit: No Patient Requires Transmission-Based Precautions: No Implantable device outside of the clinic excluding No Patient Has Alerts: No cellular tissue based products placed in the center since last visit: Has Dressing in Place as Prescribed: Yes Has Compression in Place as Prescribed: Yes Pain Present Now: No Electronic Signature(s) Signed: 07/27/2022 4:40:27 PM By: Zenaida Deed RN, BSN Entered By: Zenaida Deed on 07/27/2022 08:31:05 -------------------------------------------------------------------------------- Encounter Discharge Information Details Patient Name: Date of Service: [name], [name] 07/27/2022 8:15 A M Medical Record Number: [id number] Patient Account Number: [id number] Date of Birth/Sex: Treating RN: [birth year] (36 y.o. [name] Primary Care Mialee Weyman: [name] Other Clinician: Referring Iley Breeden: Treating Cipriana Biller/Extender: [name] in  Treatment: [phone] Encounter Discharge Information Items Post Procedure Vitals Discharge Condition: Stable Temperature (F): 97.8 Ambulatory Status: Ambulatory Pulse (bpm): 89 Discharge Destination: Home Respiratory Rate (breaths/min): 18 Transportation: Private Auto Blood Pressure (mmHg): 137/88 Accompanied By: self Schedule Follow-up Appointment: Yes Clinical Summary of Care: Patient Declined Electronic Signature(s) Signed: 07/27/2022 4:40:27 PM By: Zenaida Deed RN, BSN Entered By: Zenaida Deed on 07/27/2022 09:17:36 -------------------------------------------------------------------------------- Lower Extremity Assessment Details Patient Name: Date of Service: [name], [name] 07/27/2022 8:15 A M Medical Record Number: [id number] Patient Account Number: [id number] Date of Birth/Sex: Treating RN: October 17, 1985 (36 y.o. [name] Primary Care Amaryah Mallen: [name] Other Clinician: Referring Adaliz Dobis: Treating Kingstyn Deruiter/Extender: [name] in Treatment: 143 Edema Assessment A[Left: [place], [name] ([id number])] Franne Forts: [id number] Page 2 of 9] Assessed: [Left: No] [Right: No] Edema: [Left: Ye] [Right: s] Calf Left: Right: Point of Measurement: 48 cm From Medial Instep 48.5 cm Ankle Left: Right: Point of Measurement: 11 cm From Medial Instep 28.5 cm Vascular Assessment Pulses: Dorsalis Pedis Palpable: [Left:Yes] Electronic Signature(s) Signed: 07/27/2022 4:40:27 PM By: Zenaida Deed RN, BSN Entered By: Zenaida Deed on 07/27/2022 08:37:24 -------------------------------------------------------------------------------- Multi Wound Chart Details Patient Name: Date of Service: [name], [name] 07/27/2022 8:15 A M Medical Record Number: [id number] Patient Account Number: [id number] Date of Birth/Sex: Treating RN: 1985/10/04 (36 y.o. M) Primary Care Tecla Mailloux: [name] Other Clinician: Referring  Quinterious Walraven: Treating Rodricus Candelaria/Extender: [name] in Treatment: 143 Vital Signs Height(in): 77 Capillary Blood Glucose(mg/dl): 87 Weight(lbs): 606 Pulse(bpm): 89 Body Mass Index(BMI): 33.2 Blood Pressure(mmHg): 137/88 Temperature(F): 97.8 Respiratory Rate(breaths/min): 18 [13:Photos:] [N/A:N/A] Left Knee Left, Lateral, Plantar Foot N/A Wound Location: Pressure Injury Trauma N/A Wounding Event: Pressure Ulcer Diabetic Wound/Ulcer of the Lower N/A Primary Etiology: Extremity Diabetic Wound/Ulcer of the Lower N/A N/A Secondary Etiology: Extremity Type II Diabetes Type II Diabetes N/A Comorbid History: 07/15/2022 10/02/2019 N/A Date Acquired: 0 143 N/A Weeks of Treatment: Open Open N/A Wound Status: No No  N/A Wound Recurrence: 0.5x3.3x0.1 2.4x3x1.1 N/A Measurements L x W x [name] 1.296 5.655 N/A A (cm) : rea 0.13 6.22 N/A Volume (cm) : N/A -242.90% N/A % Reduction in A rea: N/A -3669.70% N/A % Reduction in Volume: Category/Stage III Grade 2 N/A Classification: None Present Medium N/A Exudate A mount: N/A Serosanguineous N/A Exudate Type: N/A red, brown N/A Exudate Color: Flat and Intact Well defined, not attached N/A Wound Margin: Small (1-33%) Large (67-100%) N/A Granulation A mount: Red Red N/A Granulation Quality: Large (67-100%) None Present (0%) N/A Necrotic A mount: [place], [name] ([id number]) [id number] Page 3 of 9 Eschar N/A N/A Necrotic Tissue: Fat Layer (Subcutaneous Tissue): Yes Fat Layer (Subcutaneous Tissue): Yes N/A Exposed Structures: Fascia: No Fascia: No Tendon: No Tendon: No Muscle: No Muscle: No Joint: No Joint: No Bone: No Bone: No Small (1-33%) Small (1-33%) N/A Epithelialization: Debridement - Selective/Open Wound Debridement - Selective/Open Wound N/A Debridement: Pre-procedure Verification/Time Out 08:45 08:45 N/A Taken: Lidocaine 4% Topical Solution Lidocaine  4% Topical Solution N/A Pain Control: Necrotic/Eschar, Slough Callus N/A Tissue Debrided: Non-Viable Tissue Non-Viable Tissue N/A Level: 1.3 6.48 N/A Debridement A (sq cm): rea Curette Curette N/A Instrument: Minimum Minimum N/A Bleeding: Pressure Pressure N/A Hemostasis A chieved: 0 0 N/A Procedural Pain: 0 0 N/A Post Procedural Pain: Procedure was tolerated well Procedure was tolerated well N/A Debridement Treatment Response: 0.5x3.3x0.1 2.5x3.3x1.1 N/A Post Debridement Measurements L x W x [name] 0.13 7.127 N/A Post Debridement Volume: (cm) No Abnormalities Noted No Abnormalities Noted N/A Periwound Skin Texture: No Abnormalities Noted Maceration: Yes N/A Periwound Skin Moisture: No Abnormalities Noted No Abnormalities Noted N/A Periwound Skin Color: No Abnormality No Abnormality N/A Temperature: Debridement N/A N/A Procedures Performed: Treatment Notes Electronic Signature(s) Signed: 07/27/2022 8:55:26 AM By: Duanne Guess MD FACS Entered By: Duanne Guess on 07/27/2022 08:55:26 -------------------------------------------------------------------------------- Multi-Disciplinary Care Plan Details Patient Name: Date of Service: [name], [name] 07/27/2022 8:15 A M Medical Record Number: [id number] Patient Account Number: [id number] Date of Birth/Sex: Treating RN: 30-Jan-1986 (36 y.o. [name] Primary Care Asani Deniston: [name] Other Clinician: Referring Britt Petroni: Treating Hussain Maimone/Extender: [name] in Treatment: 143 Multidisciplinary Care Plan reviewed with physician Active Inactive Nutrition Nursing Diagnoses: Imbalanced nutrition Potential for alteratiion in Nutrition/Potential for imbalanced nutrition Goals: Patient/caregiver agrees to and verbalizes understanding of need to use nutritional supplements and/or vitamins as prescribed Date Initiated: 10/24/2019 Date Inactivated: 04/06/2020 Target Resolution  Date: 04/03/2020 Goal Status: Met Patient/caregiver will maintain therapeutic glucose control Date Initiated: 10/24/2019 Target Resolution Date: 08/24/2022 Goal Status: Active Interventions: Assess HgA1c results as ordered upon admission and as needed Assess patient nutrition upon admission and as needed per policy Provide education on elevated blood sugars and impact on wound healing Provide education on nutrition Treatment Activities: Education provided on Nutrition : 12/07/2021 Notes: [place], [name] ([id number]) [id number] Page 4 of 9 11/17/20: Glucose control ongoing issue, target date extended. 01/26/21: Glucose management continues. Wound/Skin Impairment Nursing Diagnoses: Impaired tissue integrity Knowledge deficit related to ulceration/compromised skin integrity Goals: Patient/caregiver will verbalize understanding of skin care regimen Date Initiated: 10/24/2019 Target Resolution Date: 08/24/2022 Goal Status: Active Ulcer/skin breakdown will have a volume reduction of 30% by week 4 Date Initiated: 10/24/2019 Date Inactivated: 01/16/2020 Target Resolution Date: 01/10/2020 Unmet Reason: no change in Goal Status: Unmet measurements. Interventions: Assess patient/caregiver ability to obtain necessary supplies Assess patient/caregiver ability to perform ulcer/skin care regimen upon admission and as needed Assess ulceration(s) every visit Provide education on ulcer and skin care  Notes: 11/17/20: Wound care regimen continues Electronic Signature(s) Signed: 07/27/2022 4:40:27 PM By: Zenaida Deed RN, BSN Entered By: Zenaida Deed on 07/27/2022 08:44:58 -------------------------------------------------------------------------------- Negative Pressure Wound Therapy Maintenance (NPWT) Details Patient Name: Date of Service: [name] 07/27/2022 8:15 A M Medical Record Number: [id number] Patient Account Number: [id number] Date of Birth/Sex:  Treating RN: 03-May-1985 (36 y.o. [name] Primary Care Teneisha Gignac: [name] Other Clinician: Referring Shaun Runyon: Treating Dilraj [name]/Extender: [name] in Treatment: 143 NPWT Maintenance Performed for: Wound #3 Left, Lateral, Plantar Foot Additional Injuries Covered: No Performed By: Zenaida Deed, RN Type: VAC System Coverage Size (sq cm): 7.2 Pressure Type: Constant Pressure Setting: 125 mmHG Drain Type: None Primary Contact: Other : collagen and keystone antibiotic Sponge/Dressing Type: Foam- Black Date Initiated: 02/11/2022 Dressing Removed: Yes Quantity of Sponges/Gauze Removed: 1 Canister Changed: No Dressing Reapplied: Yes Quantity of Sponges/Gauze Inserted: 1 Respones T Treatment: o good Days On NPWT : 167 Post Procedure Diagnosis Same as Pre-procedure Electronic Signature(s) Signed: 07/27/2022 4:40:27 PM By: Zenaida Deed RN, BSN Entered By: Zenaida Deed on 07/27/2022 08:57:04 -------------------------------------------------------------------------------- Pain Assessment Details Patient Name: Date of Service: [name], [name] 07/27/2022 8:15 A M Medical Record Number: [id number] Patient Account Number: [id number] [place], [name] ([id number]) [id number] Page 5 of 9 Date of Birth/Sex: Treating RN: 1985/04/15 (36 y.o. [name] Primary Care Advait Buice: Other Clinician: [name] Referring Traeger Sultana: Treating Rudolfo Brandow/Extender: [name] in Treatment: [phone] Active Problems Location of Pain Severity and Description of Pain Patient Has Paino No Site Locations Rate the pain. Current Pain Level: 0 Pain Management and Medication Current Pain Management: Electronic Signature(s) Signed: 07/27/2022 4:40:27 PM By: Zenaida Deed RN, BSN Entered By: Zenaida Deed on 07/27/2022  08:32:50 -------------------------------------------------------------------------------- Patient/Caregiver Education Details Patient Name: Date of Service: [name], [name] 4/24/2024andnbsp8:15 A M Medical Record Number: [id number] Patient Account Number: [id number] Date of Birth/Gender: Treating RN: November 27, 1985 (36 y.o. [name] Primary Care Physician: [name] Other Clinician: Referring Physician: Treating Physician/Extender: [name] in Treatment: 143 Education Assessment Education Provided To: Patient Education Topics Provided Elevated Blood Sugar/ Impact on Healing: Methods: Explain/Verbal Responses: Reinforcements needed, State content correctly Offloading: Methods: Explain/Verbal Responses: Reinforcements needed, State content correctly Wound/Skin Impairment: Methods: Explain/Verbal Responses: Reinforcements needed, State content correctly Electronic Signature(s) Signed: 07/27/2022 4:40:27 PM By: Zenaida Deed RN, BSN Entered By: Zenaida Deed on 07/27/2022 08:45:39 [place], [name] ([id number]) [id number] Page 6 of 9 -------------------------------------------------------------------------------- Wound Assessment Details Patient Name: Date of Service: [name] 07/27/2022 8:15 A M Medical Record Number: [id number] Patient Account Number: [id number] Date of Birth/Sex: Treating RN: 1986/01/12 (36 y.o. [name] Primary Care Eulalie Speights: [name] Other Clinician: Referring Shafter [name]: Treating Lief Palmatier/Extender: [name] in Treatment: 143 Wound Status Wound Number: 13 Primary Etiology: Pressure Ulcer Wound Location: Left Knee Secondary Etiology: Diabetic Wound/Ulcer of the Lower Extremity Wounding Event: Pressure Injury Wound Status: Open Date Acquired: 07/15/2022 Comorbid History: Type II Diabetes Weeks Of Treatment: 0 Clustered Wound:  No Photos Wound Measurements Length: (cm) 0.5 Width: (cm) 3.3 Depth: (cm) 0.1 Area: (cm) 1.296 Volume: (cm) 0.13 % Reduction in Area: % Reduction in Volume: Epithelialization: Small (1-33%) Tunneling: No Undermining: No Wound Description Classification: Category/Stage III Wound Margin: Flat and Intact Exudate Amount: None Present Foul Odor After Cleansing: No Slough/Fibrino Yes Wound Bed Granulation Amount: Small (1-33%) Exposed Structure Granulation Quality: Red Fascia Exposed: No Necrotic Amount: Large (67-100%) Fat Layer (  Subcutaneous Tissue) Exposed: Yes Necrotic Quality: Eschar Tendon Exposed: No Muscle Exposed: No Joint Exposed: No Bone Exposed: No Periwound Skin Texture Texture Color No Abnormalities Noted: Yes No Abnormalities Noted: Yes Moisture Temperature / Pain No Abnormalities Noted: Yes Temperature: No Abnormality Treatment Notes Wound #13 (Knee) Wound Laterality: Left Cleanser Peri-Wound Care Topical Primary Dressing Maxorb Extra Ag+ Alginate Dressing, 4x4.75 (in/in) Discharge Instruction: Apply to wound bed as instructed [employer], [name] ([id number]) [id number] Page 7 of 9 Secondary Dressing Zetuvit Plus Silicone Border Dressing 4x4 (in/in) Discharge Instruction: Apply silicone border over primary dressing as directed. Secured With Compression Wrap Compression Stockings Facilities manager) Signed: 07/27/2022 4:40:27 PM By: Zenaida Deed RN, BSN Entered By: Zenaida Deed on 07/27/2022 08:54:20 -------------------------------------------------------------------------------- Wound Assessment Details Patient Name: Date of Service: [name], [name] 07/27/2022 8:15 A M Medical Record Number: [id number] Patient Account Number: [id number] Date of Birth/Sex: Treating RN: 1986-01-06 (36 y.o. [name] Primary Care Akoni Parton: [name] Other Clinician: Referring Arriana Lohmann: Treating  Wiley Flicker/Extender: [name] in Treatment: 143 Wound Status Wound Number: 3 Primary Etiology: Diabetic Wound/Ulcer of the Lower Extremity Wound Location: Left, Lateral, Plantar Foot Wound Status: Open Wounding Event: Trauma Comorbid History: Type II Diabetes Date Acquired: 10/02/2019 Weeks Of Treatment: 143 Clustered Wound: No Photos Wound Measurements Length: (cm) 2.4 Width: (cm) 3 Depth: (cm) 1.1 Area: (cm) 5.655 Volume: (cm) 6.22 % Reduction in Area: -242.9% % Reduction in Volume: -3669.7% Epithelialization: Small (1-33%) Tunneling: No Undermining: No Wound Description Classification: Grade 2 Wound Margin: Well defined, not attached Exudate Amount: Medium Exudate Type: Serosanguineous Exudate Color: red, brown Foul Odor After Cleansing: No Slough/Fibrino Yes Wound Bed Granulation Amount: Large (67-100%) Exposed Structure Granulation Quality: Red Fascia Exposed: No Necrotic Amount: None Present (0%) Fat Layer (Subcutaneous Tissue) Exposed: Yes Tendon Exposed: No Muscle Exposed: No Joint Exposed: No Bone Exposed: No Periwound Skin Texture [place], [name] ([id number]) [id number] Page 8 of 9 Texture Color No Abnormalities Noted: Yes No Abnormalities Noted: Yes Moisture Temperature / Pain No Abnormalities Noted: No Temperature: No Abnormality Maceration: Yes Treatment Notes Wound #3 (Foot) Wound Laterality: Plantar, Left, Lateral Cleanser Soap and Water Discharge Instruction: May shower and wash wound with dial antibacterial soap and water prior to dressing change. Vashe 5.8 (oz) Discharge Instruction: Cleanse the wound with Vashe prior to applying a clean dressing, let sit on wound for 5-10 minutesusing gauze sponges, not tissue or cotton balls. Peri-Wound Care Skin Prep Discharge Instruction: Use skin prep as directed Topical keystone antibiotic compound Discharge Instruction: apply to base of  wound Primary Dressing Promogran Prisma Matrix, 4.34 (sq in) (silver collagen) Discharge Instruction: Moisten collagen with saline or hydrogel VAC Secondary Dressing Zetuvit Absorbent Pad, 4x4 (in/in) Secured With Elastic Bandage 4 inch (ACE bandage) Discharge Instruction: Secure with ACE bandage as directed. 20M Medipore Soft Cloth Surgical T 2x10 (in/yd) ape Discharge Instruction: Secure with tape as directed. Compression Wrap Kerlix Roll 4.5x3.1 (in/yd) Discharge Instruction: Apply Kerlix and Coban compression as directed. Compression Stockings Add-Ons Electronic Signature(s) Signed: 07/27/2022 4:40:27 PM By: Zenaida Deed RN, BSN Entered By: Zenaida Deed on 07/27/2022 08:54:51 -------------------------------------------------------------------------------- Vitals Details Patient Name: Date of Service: [name], [name] 07/27/2022 8:15 A M Medical Record Number: [id number] Patient Account Number: [id number] Date of Birth/Sex: Treating RN: Aug 31, 1985 (36 y.o. [name] Primary Care Laetitia Schnepf: [name] Other Clinician: Referring Lendy Dittrich: Treating Hobart Marte/Extender: [name] in Treatment: 143 Vital Signs Time Taken: 08:31 Temperature (F): 97.8 Height (in): 77 Pulse (  bpm): 89 Weight (lbs): 280 Respiratory Rate (breaths/min): 18 Body Mass Index (BMI): 33.2 Blood Pressure (mmHg): 137/88 Capillary Blood Glucose (mg/dl): 87 Reference Range: 80 - 120 mg / dl Notes glucose per pt report this am [place], [name] ([id number]) [phone].pdf Page 9 of 9 Electronic Signature(s) Signed: 07/27/2022 4:40:27 PM By: Zenaida Deed RN, BSN Entered By: Zenaida Deed on 07/27/2022 08:32:38

## 2022-07-29 ENCOUNTER — Ambulatory Visit: Payer: BC Managed Care – PPO | Admitting: Podiatry

## 2022-08-03 ENCOUNTER — Encounter (HOSPITAL_BASED_OUTPATIENT_CLINIC_OR_DEPARTMENT_OTHER): Payer: BC Managed Care – PPO | Attending: General Surgery | Admitting: General Surgery

## 2022-08-03 DIAGNOSIS — L97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: Secondary | ICD-10-CM | POA: Insufficient documentation

## 2022-08-03 DIAGNOSIS — I890 Lymphedema, not elsewhere classified: Secondary | ICD-10-CM | POA: Diagnosis not present

## 2022-08-03 DIAGNOSIS — L84 Corns and callosities: Secondary | ICD-10-CM | POA: Diagnosis not present

## 2022-08-03 DIAGNOSIS — L97528 Non-pressure chronic ulcer of other part of left foot with other specified severity: Secondary | ICD-10-CM | POA: Diagnosis not present

## 2022-08-03 DIAGNOSIS — Z8616 Personal history of COVID-19: Secondary | ICD-10-CM | POA: Diagnosis not present

## 2022-08-03 DIAGNOSIS — Z09 Encounter for follow-up examination after completed treatment for conditions other than malignant neoplasm: Secondary | ICD-10-CM | POA: Diagnosis not present

## 2022-08-03 DIAGNOSIS — L97322 Non-pressure chronic ulcer of left ankle with fat layer exposed: Secondary | ICD-10-CM | POA: Diagnosis not present

## 2022-08-03 DIAGNOSIS — E11621 Type 2 diabetes mellitus with foot ulcer: Secondary | ICD-10-CM | POA: Insufficient documentation

## 2022-08-03 DIAGNOSIS — L89892 Pressure ulcer of other site, stage 2: Secondary | ICD-10-CM | POA: Insufficient documentation

## 2022-08-04 NOTE — Progress Notes (Signed)
[place], [name] ([id number]) [id number] Page 1 of 22 Visit Report for 08/03/2022 Chief Complaint Document Details Patient Name: Date of Service: [name] 08/03/2022 8:15 A M Medical Record Number: [id number] Patient Account Number: [id number] Date of Birth/Sex: Treating RN: 31-Aug-1985 (36 y.o. M) Primary Care Provider: [name] Other Clinician: Referring Provider: Treating Provider/Extender: [name] in Treatment: 144 Information Obtained from: Patient Chief Complaint 01/11/2019; patient is here for review of a rather substantial wound over the left fifth plantar metatarsal head extending into the lateral part of his foot 10/24/2019; patient returns to clinic with wounds on his bilateral feet with underlying osteomyelitis biopsy-proven Electronic Signature(s) Signed: 08/03/2022 8:50:43 AM By: Duanne Guess MD FACS Entered By: Duanne Guess on 08/03/2022 08:50:43 -------------------------------------------------------------------------------- Debridement Details Patient Name: Date of Service: [name], [name] 08/03/2022 8:15 A M Medical Record Number: [id number] Patient Account Number: [id number] Date of Birth/Sex: Treating RN: Aug 10, 1985 (36 y.o. [name] Primary Care Provider: [name] Other Clinician: Referring Provider: Treating Provider/Extender: [name] in Treatment: 144 Debridement Performed for Assessment: Wound #3 Left,Lateral,Plantar Foot Performed By: Physician Duanne Guess, MD Debridement Type: Debridement Severity of Tissue Pre Debridement: Fat layer exposed Level of Consciousness (Pre-procedure): Awake and Alert Pre-procedure Verification/Time Out Yes - 08:30 Taken: Start Time: 08:31 Percent of Wound Bed Debrided: 100% T Area Debrided (cm): otal 5.89 Tissue and other material debrided: Non-Viable, Callus, Skin: Epidermis Level:  Skin/Epidermis Debridement Description: Selective/Open Wound Instrument: Curette Bleeding: Minimum Hemostasis Achieved: Pressure Procedural Pain: 0 Post Procedural Pain: 0 Response to Treatment: Procedure was tolerated well Level of Consciousness (Post- Awake and Alert procedure): Post Debridement Measurements of Total Wound Length: (cm) 2.5 Width: (cm) 3 Depth: (cm) 1.1 Volume: (cm) 6.48 Character of Wound/Ulcer Post Debridement: Stable Severity of Tissue Post Debridement: Fat layer exposed Post Procedure Diagnosis Same as Pre-procedure Notes Scribed for Dr. Lady Gary by Zenaida Deed, RN Electronic Signature(s) [place], [name] ([id number]) [id number] Page 2 of 22 Signed: 08/03/2022 9:02:51 AM By: Duanne Guess MD FACS Signed: 08/03/2022 4:44:16 PM By: Zenaida Deed RN, BSN Entered By: Zenaida Deed on 08/03/2022 08:37:41 -------------------------------------------------------------------------------- HPI Details Patient Name: Date of Service: [name], [name] 08/03/2022 8:15 A M Medical Record Number: [id number] Patient Account Number: [id number] Date of Birth/Sex: Treating RN: 31-Dec-1985 (36 y.o. 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 He comes in for review of a wound over the plantar fifth metatarsal head extending into the lateral part of the foot. He was followed for this previously by his podiatrist Dr. Darreld Mclean. As the patient tells his story he went to see podiatry first for a swelling he developed on the lateral part of his fifth metatarsal head in May. He states this was "open" by podiatry and the area closed. He was followed up in June and it was again opened callus  removed and it closed promptly. There were plans being made for surgery on the fifth metatarsal head in June however his blood sugar was apparently too high for anesthesia. Apparently the area was debrided and opened again in June and it is never closed since. Looking over the records from podiatry I am really not able to follow this. It was clear when he was first seen it was before 5/14 at  that point he already had a wound. By 5/17 the ulcer was resolved. I do not see anything about a procedure. On 5/28 noted to have pre-ulcerative moderate keratosis. X-ray noted 1/5 contracted toe and tailor's bunion and metatarsal deformity. On a visit date on 09/28/2018 the dorsal part of the left foot it healed and resolved. There was concern about swelling in his lower extremity he was sent to the ER.. As far as I can tell he was seen in the ER on 7/12 with an ulcer on his left foot. A DVT rule out of the left leg was negative. I do not think I have complete records from podiatry but I am not able to verify the procedures this patient states he had. He states after the last procedure the wound has never closed although I am not able to follow this in the records I have from podiatry. He has not had a recent x-ray The patient has been using Neosporin on the wound. He is wearing a Darco shoe. He is still very active up on his foot working and exercising. Past medical history; type 2 diabetes ketosis-prone, leg swelling with a negative DVT study in July. Non-smoker ABI in our clinic was 0.85 on the left 10/16; substantial wound on the plantar left fifth met head extending laterally almost to the dorsal fifth MTP. We have been using silver alginate we gave him a Darco forefoot off loader. An x-ray did not show evidence of osteomyelitis did note soft tissue emphysema which I think was due to gas tracking through an open wound. There is no doubt in my mind he requires an MRI 10/23; MRI not booked until 3 November at  the earliest this is largely due to his glucose sensor in the right arm. We have been using silver alginate. There has been an improvement 10/29; I am still not exactly sure when his MRI is booked for. He says it is the third but it is the 10th in epic. This definitely needs to be done. He is running a low-grade fever today but no other symptoms. No real improvement in the 1 02/26/2019 patient presents today for a follow-up visit here in our clinic he is last been seen in the clinic on October 29. Subsequently we were working on getting MRI to evaluate and see what exactly was going on and where we would need to go from the standpoint of whether or not he had osteomyelitis and again what treatments were going be required. Subsequently the patient ended up being admitted to the hospital on 02/07/2019 and was discharged on 02/14/2019. This is a somewhat interesting admission with a discharge diagnosis of pneumonia due to COVID-19 although he was positive for COVID-19 when tested at the urgent care but negative x2 when he was actually in the hospital. With that being said he did have acute respiratory failure with hypoxia and it was noted he also have a left foot ulceration with osteomyelitis. With that being said he did require oxygen for his pneumonia and I level 4 L. He was placed on antivirals and steroids for the COVID-19. He was also transferred to the [facility] campus at one point. Nonetheless he did subsequently discharged home and since being home has done much better in that regard. The CT angiogram did not show any pulmonary embolism. With regard to the osteomyelitis the patient was placed on vancomycin and Zosyn while in the hospital but has been changed to Augmentin at discharge. It was also recommended  that he follow- up with wound care and podiatry. Podiatry however wanted him to see [name] according to the patient prior to them doing anything further. His hemoglobin A1c was 9.9 as noted in  the hospital. Have an MRI of the left foot performed while in the hospital on 02/04/2019. This showed evidence of septic arthritis at the fifth MTP joint and osteomyelitis involving the fifth metatarsal head and proximal phalanx. There is an overlying plantar open wound noted an abscess tracking back along the lateral aspect of the fifth metatarsal shaft. There is otherwise diffuse cellulitis and mild fasciitis without findings of polymyositis. The patient did have recently pneumonia secondary to COVID-19 I looked in the chart through epic and it does appear that the patient may need to have an additional x-ray just to ensure everything is cleared and that he has no airspace disease prior to putting him into the chamber. 03/05/2019; patient was readmitted to the clinic last week. He was hospitalized twice for a viral upper respiratory tract infection from 11/1 through 11/4 and then 11/5 through 11/12 ultimately this turned out to be Covid pneumonitis. Although he was discharged on oxygen he is not using it. He says he feels fine. He has no exercise limitation no cough no sputum. His O2 sat in our clinic today was 100% on room air. He did manage to have his MRI which showed septic arthritis at the fifth MTP joint and osteomyelitis involving the fifth metatarsal head and proximal phalanx. He received Vanco and Zosyn in the hospital and then was discharged on 2 weeks of Augmentin. I do not see any relevant cultures. He was supposed to follow-up with infectious disease but I do not see that he has an appointment. 12/8; patient saw Dr. Ronnie Derby of infectious disease last week. He felt that he had had adequate antibiotic therapy. He did not go to follow-up with Dr. Logan Bores of podiatry and I have again talked to him about the pros and cons of this. He does not want to consider a ray amputation of this time. He is aware of the risks of recurrence, migration etc. He started HBO today and tolerated this well. He can  complete the Augmentin that I gave him last week. I have looked over the lab work that Dr. Effie Shy ordered his C-reactive protein was 3.3 and his sedimentation rate was 17. The C-reactive protein is never really been measurably that high in this patient 12/15; not much change in the wound today however he has undermining along the lateral part of the foot again more extensively than last week. He has some rims of epithelialization. We have been using silver alginate. He is undergoing hyperbarics but did not dive today 12/18; in for his obligatory first total contact cast change. Unfortunately there was pus coming from the undermining area around his fifth metatarsal head. This was cultured but will preclude reapplication of a cast. He is seen in conjunction with HBO 12/24; patient had staph lugdunensis in the wound in the undermining area laterally last time. We put him on doxycycline which should have covered this. The wound looks better today. I am going to give him another week of doxycycline before reattempting the total contact cast 12/31; the patient is completing antibiotics. Hemorrhagic debris in the distal part of the wound with some undermining distally. He also had hyper granulation. Extensive debridement with a #5 curette. The infected area that was on the lateral part of the fifth met head is closed over. I do  not think he needs any more antibiotics. Patient was seen prior to HBO. Preparations for a total contact cast were made in the cast will be placed post hyperbarics 04/11/19; once again the patient arrives today without complaint. He had been in a cast all week noted that he had heavy drainage this week. This resulted in [place], [name] ([id number]) [id number] Page 3 of 22 large raised areas of macerated tissue around the wound 1/14; wound bed looks better slightly smaller. Hydrofera Blue has been changing himself. He had a heavy drainage last week which  caused a lot of maceration around the wound so I took him out of a total contact cast he says the drainage is actually better this week He is seen today in conjunction with HBO 1/21; returns to clinic. He was up in [place] for a day or 2 attending a funeral. He comes back in with the wound larger and with a large area of exposed bone. He had osteomyelitis and septic arthritis of the fifth left metatarsal head while he was in hospital. He received IV antibiotics in the hospital for a prolonged period of time then 3 weeks of Augmentin. Subsequently I gave him 2 weeks of doxycycline for more superficial wound infection. When I saw this last week the wound was smaller the surface of the wound looks satisfactory. 1/28; patient missed hyperbarics today. Bone biopsy I did last time showed Enterococcus faecalis and Staphylococcus lugdunensis . He has a wide area of exposed bone. We are going to use silver alginate as of today. I had another ethical discussion with the patient. This would be recurrent osteomyelitis he is already received IV antibiotics. In this situation I think the likelihood of healing this is low. Therefore I have recommended a ray amputation and with the patient's agreement I have referred him to Dr. Victorino Dike. The other issue is that his compliance with hyperbarics has been minimal because of his work schedule and given his underlying decision I am going to stop this today READMISSION 10/24/2019 MRI 09/29/2019 left foot IMPRESSION: 1. Apparent skin ulceration inferior and lateral to the 5th metatarsal base with underlying heterogeneous T2 signal and enhancement in the subcutaneous fat. Small peripherally enhancing fluid collections along the plantar and lateral aspects of the 5th metatarsal base suspicious for abscesses. 2. Interval amputation through the mid 5th metatarsal with nonspecific low-level marrow edema and enhancement. Given the proximity to the adjacent soft tissue  inflammatory changes, osteomyelitis cannot be excluded. 3. The additional bones appear unremarkable. MRI 09/29/2019 right foot IMPRESSION: 1. Soft tissue ulceration lateral to the 5th MTP joint. There is low-level T2 hyperintensity within the 4th and 5th metatarsal heads and adjacent proximal phalanges without abnormal T1 signal or cortical destruction. These findings are nonspecific and could be seen with early marrow edema, hyperemia or early osteomyelitis. No evidence of septic joint. 2. Mild tenosynovitis and synovial enhancement associated with the extensor digitorum tendons at the level of the midfoot. 3. Diffuse low-level muscular T2 hyperintensity and enhancement, most consistent with diabetic myopathy. LEFT FOOT BONE Methicillin resistant staphylococcus aureus Staphylococcus lugdunensis MIC MIC CIPROFLOXACIN >=8 RESISTANT Resistant <=0.5 SENSI... Sensitive CLINDAMYCIN <=0.25 SENS... Sensitive >=8 RESISTANT Resistant ERYTHROMYCIN >=8 RESISTANT Resistant >=8 RESISTANT Resistant GENTAMICIN <=0.5 SENSI... Sensitive <=0.5 SENSI... Sensitive Inducible Clindamycin NEGATIVE Sensitive NEGATIVE Sensitive OXACILLIN >=4 RESISTANT Resistant 2 SENSITIVE Sensitive RIFAMPIN <=0.5 SENSI... Sensitive <=0.5 SENSI... Sensitive TETRACYCLINE <=1 SENSITIVE Sensitive <=1 SENSITIVE Sensitive TRIMETH/SULFA <=10 SENSIT Sensitive <=10 SENSIT Sensitive ... Marland Kitchen.. VANCOMYCIN 1 SENSITIVE Sensitive <=0.5 SENSI.Marland KitchenMarland Kitchen  Sensitive Right foot bone . Component 3 wk ago Specimen Description BONE Special Requests RIGHT 4 METATARSAL SAMPLE B Gram Stain NO WBC SEEN NO ORGANISMS SEEN Culture RARE METHICILLIN RESISTANT STAPHYLOCOCCUS AUREUS NO ANAEROBES ISOLATED Performed at [facility], [address] [place], [place] [address] Report Status 10/08/2019 FINAL Organism ID, Bacteria METHICILLIN RESISTANT STAPHYLOCOCCUS AUREUS Resulting Agency [facility] Susceptibility Methicillin resistant staphylococcus  aureus MIC CIPROFLOXACIN >=8 RESISTANT Resistant CLINDAMYCIN <=0.25 SENS... Sensitive ERYTHROMYCIN >=8 RESISTANT Resistant GENTAMICIN <=0.5 SENSI... Sensitive Inducible Clindamycin NEGATIVE Sensitive OXACILLIN >=4 RESISTANT Resistant RIFAMPIN <=0.5 SENSI... Sensitive TETRACYCLINE <=1 SENSITIVE Sensitive TRIMETH/SULFA <=10 SENSIT Sensitive ... VANCOMYCIN 1 SENSITIVE Sensitive [place], [name] ([id number]) [id number] Page 4 of 22 This is a patient we had in clinic earlier this year with a wound over his left fifth metatarsal head. 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 Victorino Dike on 05/16/2019. He seems to have developed problems with wounds on his bilateral feet in June 2021 although he may have had problems earlier than this. He was in an urgent care with a right foot ulcer on 09/26/2019 and given a course of doxycycline. This was apparently after having trouble getting into see orthopedics. He was seen by podiatry on 09/28/2019 noted to have bilateral lower extremity ulcers including the left lateral fifth metatarsal base and the right subfifth met head. It was noted that had purulent drainage at that time. He required hospitalization from 6/20 through 7/2. This was because of worsening right foot wounds. He underwent bilateral operative incision and drainage and bone biopsies bilaterally. Culture results are listed above. He has been referred back to clinic by Dr. Ardelle Anton of podiatry. He is also followed by Dr. Orvan Falconer who saw him yesterday. He was discharged from hospital on Zyvox Flagyl and Levaquin and yesterday changed to doxycycline Flagyl and Levaquin. His inflammatory markers on  6/26 showed a sedimentation rate of 129 and a C-reactive protein of 5. This is improved to 14 and 1.3 respectively. This would indicate improvement. ABIs in our clinic today were 1.23 on the right and 1.20 on the left 11/01/2019 on evaluation today patient appears to be doing fairly well in regard to the wounds on his feet at this point. Fortunately there is no signs of active infection at this time. No fevers, chills, nausea, vomiting, or diarrhea. He currently is seeing infectious disease and still under their care at this point. Subsequently he also has both wounds which she has not been using collagen on as he did not receive that in his packaging he did not call us and let us know that. Apparently that just was missed on the order. Nonetheless we will get that straightened out today. 8/9-Patient returns for bilateral foot wounds, using Prisma with hydrogel moistened dressings, and the wounds appear stable. Patient using surgical shoes, avoiding much pressure or weightbearing as much as possible 8/16; patient has bilateral foot wounds. 1 on the right lateral foot proximally the other is on the left mid lateral foot. Both required debridement of callus and thick skin around the wounds. We have been using silver collagen 8/27; patient has bilateral lateral foot wounds. The area on the left substantially surrounded by callus and dry skin. This was removed from the wound edge.  The underlying wound is small. The area on the right measured somewhat smaller today. We've been using silver collagen the patient was on antibiotics for underlying osteomyelitis in the left foot. Unfortunately I did not update his antibiotics during today's visit. 9/10 I reviewed Dr. Blair Dolphin last notes he felt he had completed antibiotics his inflammatory markers were reasonably well controlled. He has a small wound on the lateral left foot and a tiny area on the right which is just above closed. He is using Hydrofera Blue  with border foam he has bilateral surgical shoes 9/24; 2 week f/u. doing well. right foot is closed. left foot still undermined. 10/14; right foot remains closed at the fifth met head. The area over the base of the left fifth metatarsal has a small open area but considerable undermining towards the plantar foot. Thick callus skin around this suggests an adequate pressure relief. We have talked about this. He says he is going to go back into his cam boot. I suggested a total contact cast he did not seem enamored with this suggestion 10/26; left foot base of the fifth metatarsal. Same condition as last time. He has skin over the area with an open wound however the skin is not adherent. He went to see Dr. Loreta Ave who did an x-ray and culture of his foot I have not reviewed the x-ray but the patient was not told anything. He is on doxycycline 11/11; since the patient was last here he was in the emergency room on 10/30 he was concerned about swelling in the left foot. They did not do any cultures or x-rays. They changed his antibiotics to cephalexin. Previous culture showed group B strep. The cephalexin is appropriate as doxycycline has less than predictable coverage. Arrives in clinic today with swelling over this area under the wound. He also has a new wound on the right fifth metatarsal head 11/18; the patient has a difficult wound on the lateral aspect of the left fifth metatarsal head. The wound was almost ballotable last week I opened it slightly expecting to see purulence however there was just bleeding. I cultured this this was negative. X-ray unchanged. We are trying to get an MRI but I am not sure were going to be able to get this through his insurance. He also has an area on the right lateral fifth metatarsal head this looks healthier 12/3; the patient finally got our MRI. Surprisingly this did not show osteomyelitis. I did show the soft tissue ulceration at the lateral plantar aspect of the  fifth metatarsal base with a tiny residual 6 mm abscess overlying the superficial fascia I have tried to culture this area I have not been able to get this to grow anything. Nevertheless the protruding tissue looks aggravated. I suspect we should try to treat the underlying "abscess with broad-spectrum antibiotics. I am going to start him on Levaquin and Flagyl. He has much less edema in his legs and I am going to continue to wrap his legs and see him weekly 12/10. I started Levaquin and Flagyl on him last week. He just picked up the Flagyl apparently there was some delay. The worry is the wound on the left fifth metatarsal base which is substantial and worsening. His foot looks like he inverts at the ankle making this a weightbearing surface. Certainly no improvement in fact I think the measurements of this are somewhat worse. We have been using 12/17; he apparently just got the Levaquin yesterday this is 2 weeks after  the fact. He has completed the Flagyl. The area over the left fifth metatarsal base still has protruding granulation tissue although it does not look quite as bad as it did some weeks ago. He has severe bilateral lymphedema although we have not been treating him for wounds on his legs this is definitely going to require compression. There was so much edema in the left I did not wish to put him in a total contact cast today. I am going to increase his compression from 3-4 layer. The area on the right lateral fifth met head actually look quite good and superficial. 12/23; patient arrived with callus on the right fifth met head and the substantial hyper granulated callused wound on the base of his fifth metatarsal. He says he is completing his Levaquin in 2 days but I do not think that adds up with what I gave him but I will have to double check this. We are using Hydrofera Blue on both areas. My plan is to put the left leg in a cast the week after New Year's 04/06/2020; patient's wounds  about the same. Right lateral fifth metatarsal head and left lateral foot over the base of the fifth metatarsal. There is undermining on the left lateral foot which I removed before application of total contact cast continuing with Hydrofera Blue new. Patient tells me he was seen by endocrinology today lab work was done [Dr. Kerr]. Also wondering whether he was referred to cardiology. I went over some lab work from previously does not have chronic renal failure certainly not nephrotic range proteinuria he does have very poorly controlled diabetes but this is not his most updated lab work. Hemoglobin A1c has been over 11 1/10; the patient had a considerable amount of leakage towards mid part of his left foot with macerated skin however the wound surface looks better the area on the right lateral fifth met head is better as well. I am going to change the dressing on the left foot under the total contact cast to silver alginate, continue with Hydrofera Blue on the right. 1/20; patient was in the total contact cast for 10 days. Considerable amount of drainage although the skin around the wound does not look too bad on the left foot. The area on the right fifth metatarsal head is closed. Our nursing staff reports large amount of drainage out of the left lateral foot wound 1/25; continues with copious amounts of drainage described by our intake staff. PCR culture I did last week showed E. coli and Enterococcus faecalis and low quantities. Multiple resistance genes documented including extended spectrum beta lactamase, MRSA, MRSE, quinolone, tetracycline. The wound is not quite as good this week as it was 5 days ago but about the same size 2/3; continues with copious amounts of malodorous drainage per our intake nurse. The PCR culture I did 2 weeks ago showed E. coli and low quantities of Enterococcus. There were multiple resistance genes detected. I put Neosporin on him last week although this does not seem  to have helped. The wound is slightly deeper today. Offloading continues to be an issue here although with the amount of drainage she has a total contact cast is just not going to work 2/10; moderate amount of drainage. Patient reports he cannot get his stocking on over the dressing. I told him we have to do that the nurse gave him suggestions on how to make this work. The wound is on the bottom and lateral part of his left  foot. Is cultured predominantly grew low amounts of Enterococcus, E. coli and anaerobes. There were multiple resistance genes detected including extended spectrum beta lactamase, quinolone, tetracycline. I could not think of an easy oral combination to address this so for now I am going to do topical antibiotics provided by [facility] I think the main agents here are vancomycin and an aminoglycoside. We have to be able to give him access to the wounds to get the topical antibiotic on 2/17; moderate amount of drainage this is unchanged. He has his Keystone topical antibiotic against the deep tissue culture organisms. He has been using this and changing the dressing daily. Silver alginate on the wound surface. 2/24; using Keystone antibiotic with silver alginate on the top. He had too much drainage for a total contact cast at one point although I think that is improving and I think in the next week or 2 it might be possible to replace a total contact cast I did not do this today. In general the wound surface looks healthy however he continues to have thick rims of skin and subcutaneous tissue around the wide area of the circumference which I debrided 06/04/2020 upon evaluation today patient appears to be doing well in regard to his wound. I do feel like he is showing signs of improvement. There is little bit of callus and dead tissue around the edges of the wound as well as what appears to be a little bit of a sinus tract that is off to the side laterally I would perform debridement to  clear that away today. 3/17; left lateral foot. The wound looks about the same as I remember. Not much depth surface looks healthy. No evidence of infection [place], [name] ([id number]) [id number] Page 5 of 22 3/25; left lateral foot. Wound surface looks about the same. Separating epithelium from the circumference. There really is no evidence of infection here however not making progress by my view 3/29; left lateral foot. Surface of the wound again looks reasonably healthy still thick skin and subcutaneous tissue around the wound margins. There is no evidence of infection. One of the concerns being brought up by the nurses has again the amount of drainage vis--vis continued use of a total contact cast 4/5; left lateral foot at roughly the base of the fifth metatarsal. Nice healthy looking granulated tissue with rims of epithelialization. The overall wound measurements are not any better but the tissue looks healthy. The only concern is the amount of drainage although he has no surrounding maceration with what we have been doing recently to absorb fluid and protect his skin. He also has lymphedema. 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 He states that the kerlix/coban under the TCC rolled down and could not get it back up. He has been using an offloading scooter and has somehow hurt his right foot using this device. This happened last week. He states that the side of his right foot developed a blister and opened. The top of his foot also has a few small open wounds he thinks is due to his socks rubbing in his shoes. He has not been using any dressings to the wound. He denies purulent drainage, fever/chills or erythema to the  wounds. 4/22; patient  presents for 1 week follow-up. He developed new wounds to the right foot that were evaluated at last clinic visit. He continues to have a total contact cast to the left leg and he reports no issues. He has been using silver collagen to the right foot wounds with no issues. He denies purulent drainage, fever/chills or erythema to the right foot wounds. He has no complaints today 4/25; patient presents for 1 week follow-up. He has a total contact cast of the left leg and reports no issues. He has been using silver alginate to the right foot wound. He denies purulent drainage, fever/chills or erythema to the right foot wounds. 5/2 patient presents for 1 week follow-up. T contact cast on the left. The wound which is on the base of the plantar foot at the base of the fifth metatarsal otal actually looks quite good and dimensions continue to gradually contract. HOWEVER the area on the right lateral fifth metatarsal head is much larger than what I remember from 2 weeks ago. Once more is he has significant levels of hypergranulation. Noteworthy that he had this same hyper granulated response on his wound on the left foot at one point in time. So much so that he I thought there was an underlying fluid collection. Based on this I think this just needs debridement. 5/9; the wound on the left actually continues to be gradually smaller with a healthy surface. Slight amount of drainage and maceration of the skin around but not too bad. However he has a large wound over the right fifth metatarsal head very much in the same configuration as his left foot wound was initially. I used silver nitrate to address the hyper granulated tissue no mechanical debridement 5/16; area on the left foot did not look as healthy this week deeper thick surrounding macerated skin and subcutaneous tissue. The area on the right foot fifth met head was about the same The area on the right ankle that we identified  last week is completely broken down into an open wound presumably a stocking rubbing issue 5/23; patient has been using a total contact cast to the left side. He has been using silver alginate underneath. He has also been using silver alginate to the right foot wounds. He has no complaints today. He denies any signs of infection. 5/31; the left-sided wound looks some better measure smaller surface granulation looks better. We have been using silver alginate under the total contact cast The large area on his right fifth met head and right dorsal foot look about the same still using silver alginate 6/6; neither side is good as I was hoping although the surface area dimensions are better. A lot of maceration on his left and right foot around the wound edge. Area on the dorsal right foot looks better. He says he was traveling. I am not sure what does the amount of maceration around the plantar wounds may be drainage issues 6/13; in general the wound surfaces look quite good on both sides. Macerated skin and raised edges around the wound required debridement although in general especially on the left the surface area seems improved. The area on the right dorsal ankle is about the same I thought this would not be such a problem to close 6/20; not much change in either wound although the one on the right looks a little better. Both wounds have thick macerated edges to the skin requiring debridements. We have been using silver alginate. The area on the dorsal right ankle  is still open I thought this would be closed. 6/28; patient comes in today with a marked deterioration in the right foot wound fifth met head. Wide area of exposed bone this is a drastic change from last time. The area on the left there we have been casting is stagnant. We have been using silver alginate in both wound areas. 7/5; bone culture I did for PCR last time was positive for Pseudomonas, group B strep, Enterococcus and Staph aureus.  There was no suggestion of methicillin resistance or ampicillin resistant genes. This was resistant to tetracycline however He comes into the clinic today with the area over his right plantar fifth metatarsal head which had been doing so well 2 weeks ago completely necrotic feeling bone. I do not know that this is going to be salvageable. The left foot wound is certainly no smaller but it has a better surface and is superficial. 7/8; patient called in this morning to say that his total contact cast was rubbing against his foot. He states he is doing fine overall. He denies signs of infection. 7/12; continued deterioration in the wound over the right fifth metatarsal head crumbling bone. This is not going to be salvageable. The patient agrees and wants to be referred to Dr. Victorino Dike which we will attempt to arrange as soon as possible. I am going to continue him on antibiotics as long as that takes so I will renew those today. The area on the left foot which is the base of the fifth metatarsal continues to look somewhat better. Healthy looking tissue no depth no debridement is necessary here. 7/20; the patient was kindly seen by Dr. Victorino Dike of orthopedics on 10/19/2020. 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 Towards this end we have taken him out of the total contact cast on the left we will put him in a wrap with Hydrofera Blue. As I understand things surgery is planned for 7/21 7/27; patient had his surgery last Thursday. 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 Apparently everything went well we did not still disturb that today The area on the left foot actually looks quite good. He has been much less mobile which probably explains this he did not seem to do well in the total contact cast secondary to drainage and maceration I think. We have been using Hydrofera Blue 11/09/2020 upon evaluation today patient  appears to be doing well with regard to his plantar foot ulcer on the left foot. Fortunately there is no evidence of active infection at this time. No fevers, chills, nausea, vomiting, or diarrhea. Overall I think that he is actually doing extremely well. Nonetheless I do believe that he is staying off of this more following the surgery in his right foot that is the reason the left is doing so great. 8/16; left plantar foot wound. This looks smaller than the last time I saw this he is using Hydrofera Blue. The surgical wound on the right foot is being followed by Dr. Victorino Dike we did not look at this today. He has surgical shoes on both feet 8/23; left plantar foot wound not as good this week. Surrounding macerated skin and subcutaneous tissue everything looks moist and wet. I do not think he is offloading this adequately. He is using a surgical shoe Apparently the right foot surgical wound is not open although I did not check his foot  8/31; left plantar foot lateral aspect. Much improved this week. He has no maceration. Some improvement in the surface area of the wound but most impressively the depth is come in we are using silver alginate. The patient is a Veterinary surgeon. He is asked that we write him a letter so he can go back to work. I have also tried to see if we can write something that will allow him to limit the amount of time that he is on his foot at work. 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 Hopefully the latter can be avoided 9/6; I believe the patient missed an appointment last week. He arrives in today with a wound looking roughly the same certainly no better. Undermining laterally and also inferiorly. We used molecuLight today in training with the patient's permission.. We are using silver alginate 9/21 wound is  measuring bigger this week although this may have to do with the aggressive circumferential debridement last week in response to the blush fluorescence on the MolecuLight. Culture I did last week showed significant MSSA and E. coli. I put him on Augmentin but he has not started it yet. We are also going to send this for compounded antibiotics at [facility]. There is no evidence of systemic infection 9/29; silver alginate. His Keystone arrived. He is completing Augmentin in 2 days. Offloading in a cam boot. Moderate drainage per our intake staff 10/5; using silver alginate. He has been using his [place]. He has completed his Augmentin. Per our intake nurse still a lot of drainage, far too much to consider a total contact cast. Wound measures about the same. He had the same undermining area that I defined last week from a roughly 11-3. I remove this today 10/12; using silver alginate he is using the [place]. He comes in for a nurse visit hence we are applying Jodie Echevaria twice a week. Measuring slightly better today and less notable drainage. Extensive debridement of the wound edge last time 10/18; using topical Keystone and silver alginate and a soft cast. Wound measurements about the same. Drainage was through his soft cast. We are changing this twice a week Tuesdays and Friday 10/25; comes in with moderate drainage. Still using Keystone silver alginate and a soft cast. Wound dimensions completely the same.He has a lot of edema in the left leg he has lymphedema. Asking for [name] to consider wrapping him as he cannot get his stocking on over the soft cast 11/2; comes in with moderate to large drainage slightly smaller in terms of width we have been using [place]. His wound looks satisfactory but not much improvement 11/4; patient presents today for obligatory cast change. Has no issues or complaints today. He denies signs of infection. 11/9; patient traveled this weekend to [place], was on the cast quite a bit.  Staining of the cast with black material from his walking boot. Drainage was not quite as bad as we feared. Using silver alginate and Keystone 11/16; we do not have size for cast therefore we have been putting a soft cast on him since the change on Friday. Still a significant amount of drainage necessitating changing twice a week. We have been using the Keystone at cast changes either hard or soft as well as silver alginate Comes in the clinic with things actually looking fairly good improvement in width. He says his offloading is  about the same 02/24/2021 upon evaluation today patient actually comes back in and is doing excellent in regard to his foot ulcer this is significantly smaller even compared to the last visit. The soft cast seems to have done extremely well for him which is great news. I do not see any signs of infection minimal debridement will be needed today. 11/30; left lateral foot much improved half a centimeter improvement in surface area. No evidence of infection. He seems to be doing better with the soft cast in the TCC therefore we will continue with this. He comes back in later in the week for a change with the nurses. This is due to drainage 12/6; no improvement in dimensions. Under illumination some debris on the surface we have been using silver alginate, soft cast. If there is anything optimistic here he seems to have have less drainage 12/13. Dimensions are improved both length and width and slightly in depth. Appears to be quite healthy today. Raised edges of this thick skin and callus around the edges however. He is in a soft cast were bringing him back once for a change on Friday. Drainage is better 12/20. Dimensions are improved. He still has raised edges of thick skin and callus around the edges. We are using a soft cast 12/28; comes in today with thick callus around the wound. Using silver under alginate under a soft cast. I do not think there is much improvement in  any measurement 2023 04/06/2021; patient was put in a total contact cast. Unfortunately not much change in surface area 1/10; not much different still thick callus and skin around the edge in spite of the total contact cast. This was just debrided last week we have been using the [facility] compounded antibiotic and silver alginate under a total contact cast 1/18 the patient's wound on the left side is doing nicely. smaller HOWEVER he comes in today with a wound on the right foot laterally. blister most likely serosangquenous drainage 1/24; the patient continues to do well in terms of the plantar left foot which is continued to contract using silver alginate under the total contact cast HOWEVER the right lateral foot is bigger with denuded skin around the edges. I used pickups and a #15 scalpel to remove this this looks like the remanence of a large blister. Cannot rule out infection. Culture in this area I did last week showed Staphylococcus lugdunensis few colonies. I am going to try to address this with his Jodie Echevaria antibiotic that is done so well on the left having linezolid and this should cover the staph 2/1; the patient's wound on his left foot which was the original plantar foot wound thick skin and eschar around the edges even in the total contact cast but the wound surface does not look too bad The real problem is on how his right lateral foot at roughly the base of the fifth metatarsal. The wound is completely necrotic more worrisome than that there is swelling around the edges of this. We have been using silver alginate on both wounds and Keystone on the right foot. Unfortunately I think he is going to require systemic antibiotics while we await cultures. He did not get the x-ray done that we ordered last week [lost the prescription 2/7; disappointingly in the area on the left foot which we are treating with a total contact cast is still not closed although it is much smaller. He continues  to have a lot of callus around the wound edge. -Right  lateral foot culture I did last week was negative x-ray also negative for osteomyelitis. 2/15: TCC silver alginate on the left and silver alginate on the right lateral. No real improvement in either area 05/26/2021: T oday, the wounds are roughly the same size as at his previous visit, post-debridement. He continues to endorse fairly substantial drainage, particularly on the right. He has been in a total contact cast on the left. There is still some callus surrounding this lesion. On the right, the periwound skin is quite macerated, along with surrounding callus. The center of the right-sided wound also has some dark, densely adherent material, which is very difficult to remove. 06/02/2021: Today, both wounds are slightly smaller. He has been using zinc oxide ointment around the right ulcer and the degree of maceration has improved markedly. There continues to be an area of nonviable tissue in the center of the right sided ulcer. The left-sided wound, which has been in the total contact cast. Appears clean and the degree of callus around it is less than previously. 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 He had to take the stairs and both wounds have increased in size. The left foot, which has been in a total contact cast, has developed a tunnel tracking to the lateral aspect of his foot. The nonviable tissue in the center of the right-sided ulcer remains recalcitrant to debridement. There is significant undermining surrounding the entirety of the left sided wound. 06/16/2021: The elevator at school has been fixed and the patient has been able to avoid putting as much weight on his wounds over the past week. We converted the left foot wound into a single lesion today, but despite this, the wound is actually smaller. The base is healthy with limited periwound callus. On the right, the  central necrotic area is still present. He continues to be quite macerated around the right sided wound, despite applying barrier cream. This does, however, have the benefit of softening the callus to make it more easily removable. 06/23/2021: Today, the left wound is smaller. The lateral aspect that had opened up previously is now closed. The wound base has a healthy bed of granulation tissue and minimal slough. Unfortunately, on the right, the wound is larger and continues to be fairly macerated. He has also reopened the wound at his right ankle. He thinks this is due to the gait he has adopted secondary to his total contact cast and boot. 06/30/2021: Today, both wounds are a little bit larger. The lateral aspect on the left has remained closed. He continues to have significant periwound maceration. [place], [name] ([id number]) [id number] Page 7 of 22 The culture that I took from the right sided wound grew a population of bacteria that is not covered by his current [facility] antibiotic. The center of the right- sided wound continues to appear necrotic with nonviable fat. It probes deeper today, but does not reach bone. 07/07/2021: The periwound maceration is a little bit less today. The right lateral foot wound has some areas that appear more viable and the necrotic center also looks a little bit better. The wound on the dorsal surface of his right foot near the ankle is contracting and the surface appears healthy. The left plantar wound surface looks healthy, but there is some new undermining on the medial portion. He did get his new Keystone antibiotic and began applying that to the right foot wound on Saturday. 07/14/2021: The intake nurse reported  substantial drainage from his wounds, but the periwound skin actually looks better than is typical for him. The wound on the dorsal surface of his right foot near the ankle is smaller and just has a small open area underneath some  dried eschar. The left plantar wound surface looks healthy and there has been no significant accumulation of callus. The right lateral foot wound looks quite a bit better, with the central portion, which typically appears necrotic, looking more viable albeit pale. 07/22/2021: His left foot is extremely macerated today. The wound is about the same size. The wound on the dorsal surface of his right foot near the ankle had closed, but he traumatized it removing the dressing and there is a tiny skin tear in that location. The right lateral foot wound is bigger, but the surface appears healthy. 07/30/2021: The wound on the dorsal surface of his right foot near the ankle is closed. The right lateral foot wound again is a little bit bigger due to some undermining. The periwound skin is in better condition, however. He has been applying zinc oxide. The wound surface is a little bit dry today. On the left, he does not have the substantial maceration that we frequently see. The wound itself is smaller and has a clean surface. 08/06/2021: Both wounds seem to have deteriorated over the past week. The right lateral foot wound has a dry surface but the periwound is boggy.. Overall wound dimensions are about the same. On the left, the wound is about the same size, but there is more undermining present underneath periwound callus. 08/13/2021: The right sided wound looks about the same, but on the left there has been substantial deterioration. The undermining continues to extend under periwound callus. Once this was removed, substantial extension of the wound was present. There is no odor or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 going on and where we would need to go from the standpoint of whether or not he had osteomyelitis and again what treatments were going be required. Subsequently the patient ended up being admitted to the hospital on 02/07/2019 and was discharged on 02/14/2019. This is a somewhat interesting admission with a discharge diagnosis of pneumonia due to COVID-19 although he was positive for COVID-19 when tested at the urgent care but negative x2 when he was actually in the hospital. With that being said he did have acute respiratory failure with hypoxia and it was noted he also have a left foot ulceration with osteomyelitis. With that being said he did require oxygen for his pneumonia and I level 4 L. He was placed on antivirals and steroids for the COVID-19. He was also transferred to the [facility] at one point. Nonetheless he did subsequently discharged home and since being home has done much better in that regard. The CT angiogram did not show any pulmonary embolism. With regard to the osteomyelitis the patient was placed on vancomycin and Zosyn while in the hospital but has been changed to Augmentin at discharge. It was also recommended that he follow- up with wound care and podiatry. Podiatry however wanted him to see [name] according to the patient prior to them doing anything  further. His hemoglobin A1c was 9.9 as noted in the hospital. Have an MRI of the left foot performed while in the hospital on 02/04/2019. This showed evidence of septic arthritis at the fifth MTP joint and osteomyelitis involving the fifth metatarsal head and proximal phalanx. There is an overlying plantar open wound noted an abscess tracking back along the lateral aspect of the fifth metatarsal shaft. There is otherwise diffuse cellulitis and mild fasciitis without findings of polymyositis. The patient did have recently pneumonia secondary to COVID-19 I looked in the chart through epic and it does appear that the patient may need to have an additional x-ray just to ensure everything is cleared and that he has no airspace disease prior to putting him into the chamber. 03/05/2019; patient was readmitted to the clinic last week. He was hospitalized twice for a viral upper respiratory tract infection from 11/1 through 11/4 and then 11/5 through 11/12 ultimately this turned out to be Covid pneumonitis. Although he was discharged on oxygen he is not using it. He says he feels fine. He has no exercise limitation no cough no sputum. His O2 sat in our clinic today was 100% on room air. He did manage to have his MRI which showed septic arthritis at the fifth MTP joint and osteomyelitis involving the fifth metatarsal head and proximal phalanx. He received Vanco and Zosyn in the hospital and then was discharged on 2 weeks of Augmentin. I do not see any relevant cultures. He was supposed to follow-up with infectious disease but I do not see that he has an appointment. 12/8; patient saw Dr.  Cromer of infectious disease last week. He felt that he had had adequate antibiotic therapy. He did not go to follow-up with Dr. Logan Bores of podiatry and I have again talked to him about the pros and cons of this. He does not want to consider a ray amputation of this time. He is aware of the risks of recurrence, migration etc. He  started HBO today and tolerated this well. He can complete the Augmentin that I gave him last week. I have looked over the lab work that Dr. Effie Shy ordered his C-reactive protein was 3.3 and his sedimentation rate was 17. The C-reactive protein is never really been measurably that high in this patient 12/15; not much change in the wound today however he has undermining along the lateral part of the foot again more extensively than last week. He has some rims of epithelialization. We have been using silver alginate. He is undergoing hyperbarics but did not dive today 12/18; in for his obligatory first total contact cast change. Unfortunately there was pus coming from the undermining area around his fifth metatarsal head. This was cultured but will preclude reapplication of a cast. He is seen in conjunction with HBO 12/24; patient had staph lugdunensis in the wound in the undermining area laterally last time. We put him on doxycycline which should have covered this. The wound looks better today. I am going to give him another week of doxycycline before reattempting the total contact cast 12/31; the patient is completing antibiotics. Hemorrhagic debris in the distal part of the wound with some undermining distally. He also had hyper granulation. Extensive debridement with a #5 curette. The infected area that was on the lateral part of the fifth met head is closed over. I do not think he needs any more antibiotics. Patient was seen prior to HBO. Preparations for a total contact cast were made in the cast will be placed post hyperbarics 04/11/19; once again the patient arrives today without complaint. He had been in a cast all week noted that he had heavy drainage this week. This resulted in large raised areas of macerated tissue around the wound 1/14; wound bed looks better slightly smaller. Hydrofera Blue has been changing himself. He had a heavy drainage last week which caused a lot of maceration around  the wound so I took him out of a total contact cast he says the drainage is actually better this week He is seen today in conjunction with HBO 1/21; returns to clinic. He was up in [place] for a day or 2 attending a funeral. He comes back in with the wound larger and with a large area of exposed bone. He had osteomyelitis and septic arthritis of the fifth left metatarsal head while he was in hospital. He received IV antibiotics in the hospital for a prolonged period of time then 3 weeks of Augmentin. Subsequently I gave him 2 weeks of doxycycline for more superficial wound infection. When I saw this last week the wound was smaller the surface of the wound looks satisfactory. 1/28; patient missed hyperbarics today. Bone biopsy I did last time showed Enterococcus faecalis and Staphylococcus lugdunensis . He has a wide area of exposed bone. We are going to use silver alginate as of today. I had another ethical discussion with the patient. This would be recurrent osteomyelitis he is already received IV antibiotics. In this situation I think the likelihood of healing this is low. Therefore I have recommended a ray amputation and with the patient's agreement I  have referred him to Dr. Victorino Dike. The other issue is that his compliance with hyperbarics has been minimal because of his work schedule and given his underlying decision I am going to stop this today READMISSION 10/24/2019 MRI 09/29/2019 left foot IMPRESSION: 1. Apparent skin ulceration inferior and lateral to the 5th metatarsal base with underlying heterogeneous T2 signal and enhancement in the subcutaneous fat. Small peripherally enhancing fluid collections along the plantar and lateral aspects of the 5th metatarsal base suspicious for abscesses. 2. Interval amputation through the mid 5th metatarsal with nonspecific low-level marrow edema and enhancement. Given the proximity to the adjacent soft tissue inflammatory changes, osteomyelitis  cannot be excluded. 3. The additional bones appear unremarkable. MRI 09/29/2019 right foot IMPRESSION: 1. Soft tissue ulceration lateral to the 5th MTP joint. There is low-level T2 hyperintensity within the 4th and 5th metatarsal heads and adjacent proximal phalanges without abnormal T1 signal or cortical destruction. These findings are nonspecific and could be seen with early marrow edema, hyperemia or early osteomyelitis. No evidence of septic joint. 2. Mild tenosynovitis and synovial enhancement associated with the [place], [name] ([id number]) [id number] Page 13 of 22 extensor digitorum tendons at the level of the midfoot. 3. Diffuse low-level muscular T2 hyperintensity and enhancement, most consistent with diabetic myopathy. LEFT FOOT BONE Methicillin resistant staphylococcus aureus Staphylococcus lugdunensis MIC MIC CIPROFLOXACIN >=8 RESISTANT Resistant <=0.5 SENSI... Sensitive CLINDAMYCIN <=0.25 SENS... Sensitive >=8 RESISTANT Resistant ERYTHROMYCIN >=8 RESISTANT Resistant >=8 RESISTANT Resistant GENTAMICIN <=0.5 SENSI... Sensitive <=0.5 SENSI... Sensitive Inducible Clindamycin NEGATIVE Sensitive NEGATIVE Sensitive OXACILLIN >=4 RESISTANT Resistant 2 SENSITIVE Sensitive RIFAMPIN <=0.5 SENSI... Sensitive <=0.5 SENSI... Sensitive TETRACYCLINE <=1 SENSITIVE Sensitive <=1 SENSITIVE Sensitive TRIMETH/SULFA <=10 SENSIT Sensitive <=10 SENSIT Sensitive ... Marland Kitchen.. VANCOMYCIN 1 SENSITIVE Sensitive <=0.5 SENSI... Sensitive Right foot bone . Component 3 wk ago Specimen Description BONE Special Requests RIGHT 4 METATARSAL SAMPLE B Gram Stain NO WBC SEEN NO ORGANISMS SEEN Culture RARE METHICILLIN RESISTANT STAPHYLOCOCCUS AUREUS NO ANAEROBES ISOLATED Performed at [facility], [address] [place], [place] [address] Report Status 10/08/2019 FINAL Organism ID, Bacteria METHICILLIN RESISTANT STAPHYLOCOCCUS AUREUS Resulting Agency [facility] Susceptibility Methicillin resistant staphylococcus aureus MIC CIPROFLOXACIN >=8 RESISTANT Resistant CLINDAMYCIN <=0.25 SENS... Sensitive ERYTHROMYCIN >=8 RESISTANT Resistant GENTAMICIN <=0.5 SENSI... Sensitive Inducible Clindamycin NEGATIVE Sensitive OXACILLIN >=4 RESISTANT Resistant RIFAMPIN <=0.5 SENSI... Sensitive TETRACYCLINE <=1 SENSITIVE Sensitive TRIMETH/SULFA <=10 SENSIT Sensitive ... VANCOMYCIN 1 SENSITIVE Sensitive This is a patient we had in clinic earlier this year with a wound over his left fifth metatarsal head. 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 Victorino Dike on 05/16/2019. He seems to have developed problems with wounds on his bilateral feet in June 2021 although he may have had problems earlier than this. He was in an urgent care with a right foot ulcer on 09/26/2019 and given a course of doxycycline. This was apparently after having trouble getting into see orthopedics. He was seen by podiatry on 09/28/2019 noted to have bilateral lower extremity ulcers including the left lateral fifth metatarsal base and the right subfifth met head. It was noted that had purulent drainage at that time. He required hospitalization from 6/20 through 7/2. This was because of worsening right foot wounds. He underwent bilateral operative incision and drainage and bone  biopsies bilaterally. Culture results are listed above. He has been referred back to clinic by Dr. Ardelle Anton of podiatry. He is also followed by Dr. Orvan Falconer who saw him yesterday. He was discharged from hospital on Zyvox Flagyl and Levaquin and yesterday changed to doxycycline Flagyl and Levaquin. His inflammatory markers on 6/26 showed a  sedimentation rate of 129 and a C-reactive protein of 5. This is improved to 14 and 1.3 respectively. This would indicate improvement. ABIs in our clinic today were 1.23 on the right and 1.20 on the left 11/01/2019 on evaluation today patient appears to be doing fairly well in regard to the wounds on his feet at this point. Fortunately there is no signs of active infection at this time. No fevers, chills, nausea, vomiting, or diarrhea. He currently is seeing infectious disease and still under their care at this point. Subsequently he also has both wounds which she has not been using collagen on as he did not receive that in his packaging he did not call us and let us know that. Apparently that just was missed on the order. Nonetheless we will get that straightened out today. 8/9-Patient returns for bilateral foot wounds, using Prisma with hydrogel moistened dressings, and the wounds appear stable. Patient using surgical shoes, avoiding much pressure or weightbearing as much as possible 8/16; patient has bilateral foot wounds. 1 on the right lateral foot proximally the other is on the left mid lateral foot. Both required debridement of callus and thick skin around the wounds. We have been using silver collagen 8/27; patient has bilateral lateral foot wounds. The area on the left substantially surrounded by callus and dry skin. This was removed from the wound edge. The underlying wound is small. The area on the right measured somewhat smaller today. We've been using silver collagen the patient was on antibiotics for underlying osteomyelitis in the left foot. Unfortunately I did not update his antibiotics during today's visit. 9/10 I reviewed Dr. Blair Dolphin last notes he felt he had completed antibiotics his inflammatory markers were reasonably well controlled. He has a small wound on the lateral left foot and a tiny area on the right which is just above closed. He is using Hydrofera Blue with border foam  he has bilateral surgical shoes 9/24; 2 week f/u. doing well. right foot is closed. left foot still undermined. 10/14; right foot remains closed at the fifth met head. The area over the base of the left fifth metatarsal has a small open area but considerable undermining towards the plantar foot. Thick callus skin around this suggests an adequate pressure relief. We have talked about this. He says he is going to go back into his cam boot. I suggested a total contact cast he did not seem enamored with this suggestion 10/26; left foot base of the fifth metatarsal. Same condition as last time. He has skin over the area with an open wound however the skin is not adherent. He went to see Dr. Loreta Ave who did an x-ray and culture of his foot I have not reviewed the x-ray but the patient was not told anything. He is on doxycycline 11/11; since the patient was last here he was in the emergency room on 10/30 he was concerned about swelling in the left foot. They did not do any cultures or x-rays. They changed his antibiotics to cephalexin. Previous culture showed group B strep. The cephalexin is appropriate as doxycycline has less than predictable coverage. Arrives in clinic today with swelling over this area  under the wound. He also has a new wound on the right fifth metatarsal head 11/18; the patient has a difficult wound on the lateral aspect of the left fifth metatarsal head. The wound was almost ballotable last week I opened it slightly expecting to see purulence however there was just bleeding. I cultured this this was negative. X-ray unchanged. We are trying to get an MRI but I am not sure were going to be able to get this through his insurance. He also has an area on the right lateral fifth metatarsal head this looks healthier 12/3; the patient finally got our MRI. Surprisingly this did not show osteomyelitis. I did show the soft tissue ulceration at the lateral plantar aspect of the fifth metatarsal base  with a tiny residual 6 mm abscess overlying the superficial fascia I have tried to culture this area I have not been able to get this to grow anything. Nevertheless the protruding tissue looks aggravated. I suspect we should try [place], [name] ([id number]) [id number] Page 14 of 22 to treat the underlying "abscess with broad-spectrum antibiotics. I am going to start him on Levaquin and Flagyl. He has much less edema in his legs and I am going to continue to wrap his legs and see him weekly 12/10. I started Levaquin and Flagyl on him last week. He just picked up the Flagyl apparently there was some delay. The worry is the wound on the left fifth metatarsal base which is substantial and worsening. His foot looks like he inverts at the ankle making this a weightbearing surface. Certainly no improvement in fact I think the measurements of this are somewhat worse. We have been using 12/17; he apparently just got the Levaquin yesterday this is 2 weeks after the fact. He has completed the Flagyl. The area over the left fifth metatarsal base still has protruding granulation tissue although it does not look quite as bad as it did some weeks ago. He has severe bilateral lymphedema although we have not been treating him for wounds on his legs this is definitely going to require compression. There was so much edema in the left I did not wish to put him in a total contact cast today. I am going to increase his compression from 3-4 layer. The area on the right lateral fifth met head actually look quite good and superficial. 12/23; patient arrived with callus on the right fifth met head and the substantial hyper granulated callused wound on the base of his fifth metatarsal. He says he is completing his Levaquin in 2 days but I do not think that adds up with what I gave him but I will have to double check this. We are using Hydrofera Blue on both areas. My plan is to put the left leg in a  cast the week after New Year's 04/06/2020; patient's wounds about the same. Right lateral fifth metatarsal head and left lateral foot over the base of the fifth metatarsal. There is undermining on the left lateral foot which I removed before application of total contact cast continuing with Hydrofera Blue new. Patient tells me he was seen by endocrinology today lab work was done [Dr. Kerr]. Also wondering whether he was referred to cardiology. I went over some lab work from previously does not have chronic renal failure certainly not nephrotic range proteinuria he does have very poorly controlled diabetes but this is not his most updated lab work. Hemoglobin A1c has been over 11 1/10; the patient had a considerable amount of  leakage towards mid part of his left foot with macerated skin however the wound surface looks better the area on the right lateral fifth met head is better as well. I am going to change the dressing on the left foot under the total contact cast to silver alginate, continue with Hydrofera Blue on the right. 1/20; patient was in the total contact cast for 10 days. Considerable amount of drainage although the skin around the wound does not look too bad on the left foot. The area on the right fifth metatarsal head is closed. Our nursing staff reports large amount of drainage out of the left lateral foot wound 1/25; continues with copious amounts of drainage described by our intake staff. PCR culture I did last week showed E. coli and Enterococcus faecalis and low quantities. Multiple resistance genes documented including extended spectrum beta lactamase, MRSA, MRSE, quinolone, tetracycline. The wound is not quite as good this week as it was 5 days ago but about the same size 2/3; continues with copious amounts of malodorous drainage per our intake nurse. The PCR culture I did 2 weeks ago showed E. coli and low quantities of Enterococcus. There were multiple resistance genes detected. I  put Neosporin on him last week although this does not seem to have helped. The wound is slightly deeper today. Offloading continues to be an issue here although with the amount of drainage she has a total contact cast is just not going to work 2/10; moderate amount of drainage. Patient reports he cannot get his stocking on over the dressing. I told him we have to do that the nurse gave him suggestions on how to make this work. The wound is on the bottom and lateral part of his left foot. Is cultured predominantly grew low amounts of Enterococcus, E. coli and anaerobes. There were multiple resistance genes detected including extended spectrum beta lactamase, quinolone, tetracycline. I could not think of an easy oral combination to address this so for now I am going to do topical antibiotics provided by [facility] I think the main agents here are vancomycin and an aminoglycoside. We have to be able to give him access to the wounds to get the topical antibiotic on 2/17; moderate amount of drainage this is unchanged. He has his Keystone topical antibiotic against the deep tissue culture organisms. He has been using this and changing the dressing daily. Silver alginate on the wound surface. 2/24; using Keystone antibiotic with silver alginate on the top. He had too much drainage for a total contact cast at one point although I think that is improving and I think in the next week or 2 it might be possible to replace a total contact cast I did not do this today. In general the wound surface looks healthy however he continues to have thick rims of skin and subcutaneous tissue around the wide area of the circumference which I debrided 06/04/2020 upon evaluation today patient appears to be doing well in regard to his wound. I do feel like he is showing signs of improvement. There is little bit of callus and dead tissue around the edges of the wound as well as what appears to be a little bit of a sinus tract that is  off to the side laterally I would perform debridement to clear that away today. 3/17; left lateral foot. The wound looks about the same as I remember. Not much depth surface looks healthy. No evidence of infection 3/25; left lateral foot. Wound surface looks about  the same. Separating epithelium from the circumference. There really is no evidence of infection here however not making progress by my view 3/29; left lateral foot. Surface of the wound again looks reasonably healthy still thick skin and subcutaneous tissue around the wound margins. There is no evidence of infection. One of the concerns being brought up by the nurses has again the amount of drainage vis--vis continued use of a total contact cast 4/5; left lateral foot at roughly the base of the fifth metatarsal. Nice healthy looking granulated tissue with rims of epithelialization. The overall wound measurements are not any better but the tissue looks healthy. The only concern is the amount of drainage although he has no surrounding maceration with what we have been doing recently to absorb fluid and protect his skin. He also has lymphedema. 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 He states that the kerlix/coban under the TCC rolled down and could not get it back up. He has been using an offloading scooter and has somehow hurt his right foot using this device. This happened last week. He states that the side of his right foot developed a blister and opened. The top of his foot also has a few small open wounds he thinks is due to his socks rubbing in his shoes. He has not been using any dressings to the wound. He denies purulent drainage, fever/chills or erythema to the wounds. 4/22; patient  presents for 1 week follow-up. He developed new wounds to the right foot that were evaluated at last clinic visit. He continues to have a total contact cast to the left leg and he reports no issues. He has been using silver collagen to the right foot wounds with no issues. He denies purulent drainage, fever/chills or erythema to the right foot wounds. He has no complaints today 4/25; patient presents for 1 week follow-up. He has a total contact cast of the left leg and reports no issues. He has been using silver alginate to the right foot wound. He denies purulent drainage, fever/chills or erythema to the right foot wounds. 5/2 patient presents for 1 week follow-up. T contact cast on the left. The wound which is on the base of the plantar foot at the base of the fifth metatarsal otal actually looks quite good and dimensions continue to gradually contract. HOWEVER the area on the right lateral fifth metatarsal head is much larger than what I remember from 2 weeks ago. Once more is he has significant levels of hypergranulation. Noteworthy that he had this same hyper granulated response on his wound on the left foot at one point in time. So much so that he I thought there was an underlying fluid collection. Based on this I think this just needs debridement. 5/9; the wound on the left actually continues to be gradually smaller with a healthy surface. Slight amount of drainage and maceration of the skin around but not too bad. However he has a large wound over the right fifth metatarsal head very much in the same configuration as his left foot wound was initially. I used silver nitrate to address the hyper granulated tissue no mechanical debridement 5/16; area on the left foot did not look as healthy this week deeper  thick surrounding macerated skin and subcutaneous tissue. oo The area on the right foot fifth met head was about the same oo The area on the right ankle that we identified last week is  completely broken down into an open wound presumably a stocking rubbing issue 5/23; patient has been using a total contact cast to the left side. He has been using silver alginate underneath. He has also been using silver alginate to the right foot wounds. He has no complaints today. He denies any signs of infection. 5/31; the left-sided wound looks some better measure smaller surface granulation looks better. We have been using silver alginate under the total contact cast oo The large area on his right fifth met head and right dorsal foot look about the same still using silver alginate 6/6; neither side is good as I was hoping although the surface area dimensions are better. A lot of maceration on his left and right foot around the wound edge. Area on the dorsal right foot looks better. He says he was traveling. I am not sure what does the amount of maceration around the plantar wounds may be [place], [name] ([id number]) [id number] Page 15 of 22 drainage issues 6/13; in general the wound surfaces look quite good on both sides. Macerated skin and raised edges around the wound required debridement although in general especially on the left the surface area seems improved. oo The area on the right dorsal ankle is about the same I thought this would not be such a problem to close 6/20; not much change in either wound although the one on the right looks a little better. Both wounds have thick macerated edges to the skin requiring debridements. We have been using silver alginate. The area on the dorsal right ankle is still open I thought this would be closed. 6/28; patient comes in today with a marked deterioration in the right foot wound fifth met head. Wide area of exposed bone this is a drastic change from last time. The area on the left there we have been casting is stagnant. We have been using silver alginate in both wound areas. 7/5; bone culture I did for PCR last time  was positive for Pseudomonas, group B strep, Enterococcus and Staph aureus. There was no suggestion of methicillin resistance or ampicillin resistant genes. This was resistant to tetracycline however He comes into the clinic today with the area over his right plantar fifth metatarsal head which had been doing so well 2 weeks ago completely necrotic feeling bone. I do not know that this is going to be salvageable. The left foot wound is certainly no smaller but it has a better surface and is superficial. 7/8; patient called in this morning to say that his total contact cast was rubbing against his foot. He states he is doing fine overall. He denies signs of infection. 7/12; continued deterioration in the wound over the right fifth metatarsal head crumbling bone. This is not going to be salvageable. The patient agrees and wants to be referred to Dr. Victorino Dike which we will attempt to arrange as soon as possible. I am going to continue him on antibiotics as long as that takes so I will renew those today. The area on the left foot which is the base of the fifth metatarsal continues to look somewhat better. Healthy looking tissue no depth no debridement is necessary here. 7/20; the patient was kindly seen by Dr. Victorino Dike of orthopedics on 10/19/2020. He agreed that he needed a ray amputation  on the right and he said he would have a look at the fourth as well while he was intraoperative. Towards this end we have taken him out of the total contact cast on the left we will put him in a wrap with Hydrofera Blue. As I understand things surgery is planned for 7/21 7/27; patient had his surgery last Thursday. 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 Apparently everything went well we did not still disturb that today The area on the left foot actually looks quite good. He has been much less mobile which probably explains this he did not seem to do well in the total contact cast secondary to drainage and maceration I  think. 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 Fortunately there is no evidence of active infection at this time. No fevers, chills, nausea, vomiting, or diarrhea. Overall I think that he is actually doing extremely well. Nonetheless I do believe that he is staying off of this more following the surgery in his right foot that is the reason the left is doing so great. 8/16; left plantar foot wound. This looks smaller than the last time I saw this he is using Hydrofera Blue. The surgical wound on the right foot is being followed by Dr. Victorino Dike we did not look at this today. He has surgical shoes on both feet 8/23; left plantar foot wound not as good this week. Surrounding macerated skin and subcutaneous tissue everything looks moist and wet. I do not think he is offloading this adequately. He is using a surgical shoe Apparently the right foot surgical wound is not open although I did not check his foot 8/31; left plantar foot lateral aspect. Much improved this week. He has no maceration. Some improvement in the surface area of the wound but most impressively the depth is come in we are using silver alginate. The patient is a Veterinary surgeon. He is asked that we write him a letter so he can go back to work. I have also tried to see if we can write something that will allow him to limit the amount of time that he is on his foot at work. 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 Hopefully the latter can be avoided 9/6; I believe the patient missed an appointment last week. He arrives in today with a wound looking roughly the same certainly no better. Undermining laterally and also inferiorly. We used molecuLight today in training with the patient's permission.. We are using silver alginate 9/21 wound is  measuring bigger this week although this may have to do with the aggressive circumferential debridement last week in response to the blush fluorescence on the MolecuLight. Culture I did last week showed significant MSSA and E. coli. I put him on Augmentin but he has not started it yet. We are also going to send this for compounded antibiotics at [facility]. There is no evidence of systemic infection 9/29; silver alginate. His Keystone arrived. He is completing Augmentin in 2 days. Offloading in a cam boot. Moderate drainage per our intake staff 10/5; using silver alginate. He has been using his Friend. He has completed his Augmentin. Per our intake nurse still a lot of drainage, far too much to consider a total contact cast. Wound measures about the same. He had  the same undermining area that I defined last week from a roughly 11-3. I remove this today 10/12; using silver alginate he is using the [place]. He comes in for a nurse visit hence we are applying Jodie Echevaria twice a week. Measuring slightly better today and less notable drainage. Extensive debridement of the wound edge last time 10/18; using topical Keystone and silver alginate and a soft cast. Wound measurements about the same. Drainage was through his soft cast. We are changing this twice a week Tuesdays and Friday 10/25; comes in with moderate drainage. Still using Keystone silver alginate and a soft cast. Wound dimensions completely the same.He has a lot of edema in the left leg he has lymphedema. Asking for [name] to consider wrapping him as he cannot get his stocking on over the soft cast 11/2; comes in with moderate to large drainage slightly smaller in terms of width we have been using [place]. His wound looks satisfactory but not much improvement 11/4; patient presents today for obligatory cast change. Has no issues or complaints today. He denies signs of infection. 11/9; patient traveled this weekend to [place], was on the cast quite a bit.  Staining of the cast with black material from his walking boot. Drainage was not quite as bad as we feared. Using silver alginate and Keystone 11/16; we do not have size for cast therefore we have been putting a soft cast on him since the change on Friday. Still a significant amount of drainage necessitating changing twice a week. We have been using the Keystone at cast changes either hard or soft as well as silver alginate Comes in the clinic with things actually looking fairly good improvement in width. He says his offloading is about the same 02/24/2021 upon evaluation today patient actually comes back in and is doing excellent in regard to his foot ulcer this is significantly smaller even compared to the last visit. The soft cast seems to have done extremely well for him which is great news. I do not see any signs of infection minimal debridement will be needed today. 11/30; left lateral foot much improved half a centimeter improvement in surface area. No evidence of infection. He seems to be doing better with the soft cast in the TCC therefore we will continue with this. He comes back in later in the week for a change with the nurses. This is due to drainage 12/6; no improvement in dimensions. Under illumination some debris on the surface we have been using silver alginate, soft cast. If there is anything optimistic here he seems to have have less drainage 12/13. Dimensions are improved both length and width and slightly in depth. Appears to be quite healthy today. Raised edges of this thick skin and callus around the edges however. He is in a soft cast were bringing him back once for a change on Friday. Drainage is better 12/20. Dimensions are improved. He still has raised edges of thick skin and callus around the edges. We are using a soft cast 12/28; comes in today with thick callus around the wound. Using silver under alginate under a soft cast. I do not think there is much improvement in  any measurement 2023 [place], [name] ([id number]) [phone].pdf Page 16 of 22 04/06/2021; patient was put in a total contact cast. Unfortunately not much change in surface area 1/10; not much different still thick callus and skin around the edge in spite of the total contact cast. This was just debrided last week we have been using  the [facility] compounded antibiotic and silver alginate under a total contact cast 1/18 the patient's wound on the left side is doing nicely. smaller HOWEVER he comes in today with a wound on the right foot laterally. blister most likely serosangquenous drainage 1/24; the patient continues to do well in terms of the plantar left foot which is continued to contract using silver alginate under the total contact cast HOWEVER the right lateral foot is bigger with denuded skin around the edges. I used pickups and a #15 scalpel to remove this this looks like the remanence of a large blister. Cannot rule out infection. Culture in this area I did last week showed Staphylococcus lugdunensis few colonies. I am going to try to address this with his Jodie Echevaria antibiotic that is done so well on the left having linezolid and this should cover the staph 2/1; the patient's wound on his left foot which was the original plantar foot wound thick skin and eschar around the edges even in the total contact cast but the wound surface does not look too bad The real problem is on how his right lateral foot at roughly the base of the fifth metatarsal. The wound is completely necrotic more worrisome than that there is swelling around the edges of this. We have been using silver alginate on both wounds and Keystone on the right foot. Unfortunately I think he is going to require systemic antibiotics while we await cultures. He did not get the x-ray done that we ordered last week [lost the prescription 2/7; disappointingly in the area on the left foot which we are treating with a  total contact cast is still not closed although it is much smaller. He continues to have a lot of callus around the wound edge. -Right lateral foot culture I did last week was negative x-ray also negative for osteomyelitis. 2/15: TCC silver alginate on the left and silver alginate on the right lateral. No real improvement in either area 05/26/2021: T oday, the wounds are roughly the same size as at his previous visit, post-debridement. He continues to endorse fairly substantial drainage, particularly on the right. He has been in a total contact cast on the left. There is still some callus surrounding this lesion. On the right, the periwound skin is quite macerated, along with surrounding callus. The center of the right-sided wound also has some dark, densely adherent material, which is very difficult to remove. 06/02/2021: Today, both wounds are slightly smaller. He has been using zinc oxide ointment around the right ulcer and the degree of maceration has improved markedly. There continues to be an area of nonviable tissue in the center of the right sided ulcer. The left-sided wound, which has been in the total contact cast. Appears clean and the degree of callus around it is less than previously. 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 He had to take the stairs and both wounds have increased in size. The left foot, which has been in a total contact cast, has developed a tunnel tracking to the lateral aspect of his foot. The nonviable tissue in the center of the right-sided ulcer remains recalcitrant to debridement. There is significant undermining surrounding the entirety of the left sided wound. 06/16/2021: The elevator at school has been fixed and the patient has been able to avoid putting as much weight on his wounds over the past week. We converted the left foot wound into a single lesion today, but  despite this, the wound is  actually smaller. The base is healthy with limited periwound callus. On the right, the central necrotic area is still present. He continues to be quite macerated around the right sided wound, despite applying barrier cream. This does, however, have the benefit of softening the callus to make it more easily removable. 06/23/2021: Today, the left wound is smaller. The lateral aspect that had opened up previously is now closed. The wound base has a healthy bed of granulation tissue and minimal slough. Unfortunately, on the right, the wound is larger and continues to be fairly macerated. He has also reopened the wound at his right ankle. He thinks this is due to the gait he has adopted secondary to his total contact cast and boot. 06/30/2021: T oday, both wounds are a little bit larger. The lateral aspect on the left has remained closed. He continues to have significant periwound maceration. The culture that I took from the right sided wound grew a population of bacteria that is not covered by his current [facility] antibiotic. The center of the right- sided wound continues to appear necrotic with nonviable fat. It probes deeper today, but does not reach bone. 07/07/2021: The periwound maceration is a little bit less today. The right lateral foot wound has some areas that appear more viable and the necrotic center also looks a little bit better. The wound on the dorsal surface of his right foot near the ankle is contracting and the surface appears healthy. The left plantar wound surface looks healthy, but there is some new undermining on the medial portion. He did get his new Keystone antibiotic and began applying that to the right foot wound on Saturday. 07/14/2021: The intake nurse reported substantial drainage from his wounds, but the periwound skin actually looks better than is typical for him. The wound on the dorsal surface of his right foot near the ankle is smaller and just has a small open area  underneath some dried eschar. The left plantar wound surface looks healthy and there has been no significant accumulation of callus. The right lateral foot wound looks quite a bit better, with the central portion, which typically appears necrotic, looking more viable albeit pale. 07/22/2021: His left foot is extremely macerated today. The wound is about the same size. The wound on the dorsal surface of his right foot near the ankle had closed, but he traumatized it removing the dressing and there is a tiny skin tear in that location. The right lateral foot wound is bigger, but the surface appears healthy. 07/30/2021: The wound on the dorsal surface of his right foot near the ankle is closed. The right lateral foot wound again is a little bit bigger due to some undermining. The periwound skin is in better condition, however. He has been applying zinc oxide. The wound surface is a little bit dry today. On the left, he does not have the substantial maceration that we frequently see. The wound itself is smaller and has a clean surface. 08/06/2021: Both wounds seem to have deteriorated over the past week. The right lateral foot wound has a dry surface but the periwound is boggy.. Overall wound dimensions are about the same. On the left, the wound is about the same size, but there is more undermining present underneath periwound callus. 08/13/2021: The right sided wound looks about the same, but on the left there has been substantial deterioration. The undermining continues to extend under periwound callus. Once this was removed, substantial extension of  the wound was present. There is no odor or purulent drainage but clearly the wounds have broken down. 08/20/2021: The wounds look about the same today. He has been out of his total contact cast and has just been changing the dressings using topical Keystone with PolyMem Ag, Kerlix and Ace bandages. The wound on the top of his right ankle has reopened but this is  quite small. There was a little bit of purulent material that I expressed when examining this wound. 08/24/2021: After the aggressive debridement I performed at his last visit, the wounds actually look a little bit better today. They are smaller with the exception of the wound on the top of his right ankle which is a little bit bigger as some more skin pulled off when he was changing his dressing. We are using topical Keystone with PolyMem Ag Kerlix and Ace bandages. 09/02/2021: There has been really no change to any of his wounds. 09/16/2021: The patient was hospitalized last week with nausea, vomiting, and dehydration. He says that while he was in the hospital, his wounds were not really addressed properly. T oday, both plantar foot wounds are larger and the periwound skin is macerated. The wound on the dorsum of his right foot has a scab on the top. The right foot now has a crater where previously he had had nonviable fat. It looks as though this simply died and fell out. The periwound callus is wet. 09/24/2021: His wounds have deteriorated somewhat since his last visit. The wound on the dorsum of his right foot near his ankle is larger and has more nonviable tissue present. The crater in his right foot is even deeper; I cannot quite palpate or probe to bone but I am sure it is close. The wound on his left plantar foot has an odd boggy area in the center that almost feels as though it has fluid within it. He has run out of his topical Keystone antibiotic. We are [place], [name] ([id number]) [id number] Page 17 of 22 using silver alginate on his wounds. 09/29/2021: He has developed a new wound on the dorsum of his left foot near his ankle. He says he thinks his wrapping is rubbing in that site. I would concur with this as the wound on his right ankle is larger. The left foot looks about the same. The right foot has the crater that was present last week. No significant slough  accumulation, but his foot remains quite swollen and warm despite oral antibiotic therapy. 10/08/2021: All of his wounds look about the same as last week. He did not start his oral antibiotics that are prescribed until just a couple of days ago; his Jodie Echevaria compounded antibiotics formula has been changed and he is awaiting delivery of the new recipe. His MRI that was scheduled for earlier this week was canceled as no prior authorization had been obtained; unfortunately the tech responsible sent an email to my old [employer] email, which I no longer use nor have access to. 10/18/2021: The wounds on his bilateral dorsal feet near the ankles are both improved. They are smaller and have just some eschar and slough buildup. The left plantar wound has a fair amount of undermining, but the surface is clean. There is some periwound callus accumulation. On the right plantar foot, there is nonviable fat leading to a deep tunnel that tracks towards his dorsal medial foot. There is periwound callus and slough accumulation, as well. His right foot and leg remain swollen as compared  to the left. 10/25/2021: The wounds on his bilateral dorsal feet and at the ankles have broken down somewhat. They are little bit larger than last week. The left plantar wound continues to undermine laterally but the surface is clean. The right plantar foot wound shows some decreased depth in the tunnel tracking towards his dorsal medial foot. He has not yet had the Doppler study that I ordered; it sounds like there is some confusion about the scheduling of the procedure. In addition, the MRI was denied and I have taken steps to appeal the denial. 11/24/2021: Since our last visit, Mr. Gitlin was admitted to the hospital where an MRI suggested osteomyelitis. He was taken the operating room by podiatry. Bone biopsies were negative for osteomyelitis. They debrided his wounds and applied myriad matrix. He saw them last week and they removed  his staples. He is here today to continue his wound healing process. T oday, both of the dorsal foot/ankle wounds are substantially smaller. There is just a little eschar overlying the left sided wound and some eschar and slough on the right. The right plantar foot ulcer has the healthiest surface of granulation tissue that I have seen to date. A portion of the myriad matrix failed to take and was hanging loose. It appears that myriad morcells were placed into the tunnel closest to the dorsal portion of his foot. These have sloughed off. The left plantar foot ulcer is about the same size, but has a much healthier surface than in the past. Both plantar ulcers have callus and slough accumulation. 12/07/2021: Left dorsal foot/ankle wound is closed. The right dorsal foot/ankle wound is nearly closed and just has a small open area with some eschar and slough. The right plantar foot wound has contracted quite a bit since our last visit. It has a healthy surface with just a little bit of slough accumulation and periwound callus buildup. The left plantar foot wound is about the same size but the surface appears healthy. There is a little slough and periwound callus on this side, as well. 12/15/2021: Both dorsal foot/ankle wounds are closed. The right plantar foot wound is substantially smaller than at our last visit. The tunneling that was present has nearly closed. There is just a little bit of slough buildup. The left plantar foot wound is also a little bit smaller today. The surface is the healthiest that I have ever seen it. Light slough and periwound callus accumulation on this side. 12/22/2021: The dorsal ankle/foot wounds remain closed. The right plantar foot wound continues to contract. There is still a bit of depth at the lateral portion of the wound but the surface has a good granulated appearance. The wound on the left is about the same size, the central indented portion is still adherent. It also is  clean with good granulation tissue present. The periwound skin and callus are a bit macerated, however. 12/29/2021: Both ankle wounds remain closed. The right plantar foot wound is less than half the size that it was last week. There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

## 2022-08-04 NOTE — Progress Notes (Signed)
[place], [name] ([id number]) [id number] Page 1 of 9 Visit Report for 08/03/2022 Arrival Information Details Patient Name: Date of Service: [name] D 08/03/2022 8:15 A M Medical Record Number: [id number] Patient Account Number: [id number] Date of Birth/Sex: Treating RN: 02/04/1986 (36 y.o. [name] Primary Care [place] Sandall: [name] Other Clinician: Referring Gaylon Melchor: Treating Jance Siek/Extender: [name] in Treatment: 144 Visit Information History Since Last Visit Added or deleted any medications: No Patient Arrived: Ambulatory Any new allergies or adverse reactions: No Arrival Time: 08:07 Had a fall or experienced change in No Accompanied By: self activities of daily living that may affect Transfer Assistance: None risk of falls: Patient Identification Verified: Yes Signs or symptoms of abuse/neglect since last No Secondary Verification Process Completed: Yes visito Patient Requires Transmission-Based Precautions: No Hospitalized since last visit: No Patient Has Alerts: No Implantable device outside of the clinic No excluding cellular tissue based products placed in the center since last visit: Has Dressing in Place as Prescribed: Yes Has Compression in Place as Prescribed: Yes Has Footwear/Offloading in Place as Yes Prescribed: Left: Removable Cast Walker/Walking Boot Pain Present Now: No Electronic Signature(s) Signed: 08/03/2022 4:44:16 PM By: Zenaida Deed RN, BSN Entered By: Zenaida Deed on 08/03/2022 08:09:06 -------------------------------------------------------------------------------- Encounter Discharge Information Details Patient Name: Date of Service: [name], [name] 08/03/2022 8:15 A M Medical Record Number: [id number] Patient Account Number: [id number] Date of Birth/Sex: Treating RN: 04-15-1985 (36 y.o. [name] Primary Care Lem Peary: [name] Other  Clinician: Referring Conception Doebler: Treating Zaidan Keeble/Extender: [name] in Treatment: [phone] Encounter Discharge Information Items Post Procedure Vitals Discharge Condition: Stable Temperature (F): 97.9 Ambulatory Status: Ambulatory Pulse (bpm): 105 Discharge Destination: Home Respiratory Rate (breaths/min): 18 Transportation: Private Auto Blood Pressure (mmHg): 137/82 Accompanied By: self Schedule Follow-up Appointment: Yes Clinical Summary of Care: Patient Declined Electronic Signature(s) Signed: 08/03/2022 4:44:16 PM By: Zenaida Deed RN, BSN Entered By: Zenaida Deed on 08/03/2022 09:09:45 -------------------------------------------------------------------------------- Lower Extremity Assessment Details Patient Name: Date of Service: [name], [name] 08/03/2022 8:15 A M Medical Record Number: [id number] Patient Account Number: [id number] Date of Birth/Sex: Treating RN: Apr 07, 1985 (36 y.o. [name] Primary Care Dakayla Disanti: [name] Other Clinician: [place], [name] ([id number]) [id number] Page 2 of 9 Referring Zaide Kardell: Treating Shonn Farruggia/Extender: [name] in Treatment: 144 Edema Assessment Assessed: [Left: No] [Right: No] Edema: [Left: Ye] [Right: s] Calf Left: Right: Point of Measurement: 48 cm From Medial Instep 48 cm Ankle Left: Right: Point of Measurement: 11 cm From Medial Instep 28 cm Vascular Assessment Pulses: Dorsalis Pedis Palpable: [Left:Yes] Electronic Signature(s) Signed: 08/03/2022 4:44:16 PM By: Zenaida Deed RN, BSN Entered By: Zenaida Deed on 08/03/2022 08:19:00 -------------------------------------------------------------------------------- Multi Wound Chart Details Patient Name: Date of Service: [name], [name] 08/03/2022 8:15 A M Medical Record Number: [id number] Patient Account Number: [id number] Date of Birth/Sex: Treating RN: February 13, 1986 (36 y.o.  M) Primary Care Jennah Satchell: [name] Other Clinician: Referring Winnell Bento: Treating Arika Mainer/Extender: [name] in Treatment: 144 Vital Signs Height(in): 77 Capillary Blood Glucose(mg/dl): 595 Weight(lbs): 638 Pulse(bpm): 105 Body Mass Index(BMI): 33.2 Blood Pressure(mmHg): 137/82 Temperature(F): 97.9 Respiratory Rate(breaths/min): 18 [13:Photos:] [N/A:N/A] Left Knee Left, Lateral, Plantar Foot N/A Wound Location: Pressure Injury Trauma N/A Wounding Event: Pressure Ulcer Diabetic Wound/Ulcer of the Lower N/A Primary Etiology: Extremity Diabetic Wound/Ulcer of the Lower N/A N/A Secondary Etiology: Extremity Type II Diabetes Type II Diabetes N/A Comorbid History: 07/15/2022 10/02/2019 N/A Date Acquired: 1 144 N/A  Weeks of Treatment: Open Open N/A Wound Status: No No N/A Wound Recurrence: 0.2x2.2x0.1 2.5x3x1.1 N/A Measurements L x W x D (cm) 0.346 5.89 N/A A (cm) : rea 0.035 6.48 N/A Volume (cm) : 73.30% -257.20% N/A % Reduction in A rea: 73.10% -3827.30% N/A % Reduction in Volume: Category/Stage III Grade 2 N/A Classification: None Present Medium N/A Exudate A mount: [place], [name] ([id number]) [id number] Page 3 of 9 N/A Serosanguineous N/A Exudate Type: N/A red, brown N/A Exudate Color: Flat and Intact Well defined, not attached N/A Wound Margin: None Present (0%) Large (67-100%) N/A Granulation Amount: N/A Red N/A Granulation Quality: Large (67-100%) None Present (0%) N/A Necrotic Amount: Eschar N/A N/A Necrotic Tissue: Fat Layer (Subcutaneous Tissue): Yes Fat Layer (Subcutaneous Tissue): Yes N/A Exposed Structures: Fascia: No Fascia: No Tendon: No Tendon: No Muscle: No Muscle: No Joint: No Joint: No Bone: No Bone: No Small (1-33%) Small (1-33%) N/A Epithelialization: N/A Debridement - Selective/Open Wound N/A Debridement: Pre-procedure Verification/Time Out N/A 08:30  N/A Taken: N/A Callus N/A Tissue Debrided: N/A Skin/Epidermis N/A Level: N/A 5.89 N/A Debridement A (sq cm): rea N/A Curette N/A Instrument: N/A Minimum N/A Bleeding: N/A Pressure N/A Hemostasis A chieved: N/A 0 N/A Procedural Pain: N/A 0 N/A Post Procedural Pain: N/A Procedure was tolerated well N/A Debridement Treatment Response: N/A 2.5x3x1.1 N/A Post Debridement Measurements L x W x D (cm) N/A 6.48 N/A Post Debridement Volume: (cm) No Abnormalities Noted No Abnormalities Noted N/A Periwound Skin Texture: No Abnormalities Noted Maceration: Yes N/A Periwound Skin Moisture: Dry/Scaly: No No Abnormalities Noted No Abnormalities Noted N/A Periwound Skin Color: No Abnormality No Abnormality N/A Temperature: N/A Debridement N/A Procedures Performed: Negative Pressure Wound Therapy Maintenance (NPWT) Treatment Notes Electronic Signature(s) Signed: 08/03/2022 8:50:34 AM By: Duanne Guess MD FACS Entered By: Duanne Guess on 08/03/2022 08:50:33 -------------------------------------------------------------------------------- Multi-Disciplinary Care Plan Details Patient Name: Date of Service: [name], [name] 08/03/2022 8:15 A M Medical Record Number: [id number] Patient Account Number: [id number] Date of Birth/Sex: Treating RN: Aug 12, 1985 (36 y.o. [name] Primary Care Emma Schupp: [name] Other Clinician: Referring Budd Freiermuth: Treating Eyonna Sandstrom/Extender: [name] in Treatment: 144 Multidisciplinary Care Plan reviewed with physician Active Inactive Nutrition Nursing Diagnoses: Imbalanced nutrition Potential for alteratiion in Nutrition/Potential for imbalanced nutrition Goals: Patient/caregiver agrees to and verbalizes understanding of need to use nutritional supplements and/or vitamins as prescribed Date Initiated: 10/24/2019 Date Inactivated: 04/06/2020 Target Resolution Date: 04/03/2020 Goal Status:  Met Patient/caregiver will maintain therapeutic glucose control Date Initiated: 10/24/2019 Target Resolution Date: 08/24/2022 Goal Status: Active Interventions: Assess HgA1c results as ordered upon admission and as needed Assess patient nutrition upon admission and as needed per policy [place], [name] ([id number]) [id number] Page 4 of 9 Provide education on elevated blood sugars and impact on wound healing Provide education on nutrition Treatment Activities: Education provided on Nutrition : 02/02/2022 Notes: 11/17/20: Glucose control ongoing issue, target date extended. 01/26/21: Glucose management continues. Wound/Skin Impairment Nursing Diagnoses: Impaired tissue integrity Knowledge deficit related to ulceration/compromised skin integrity Goals: Patient/caregiver will verbalize understanding of skin care regimen Date Initiated: 10/24/2019 Target Resolution Date: 08/24/2022 Goal Status: Active Ulcer/skin breakdown will have a volume reduction of 30% by week 4 Date Initiated: 10/24/2019 Date Inactivated: 01/16/2020 Target Resolution Date: 01/10/2020 Unmet Reason: no change in Goal Status: Unmet measurements. Interventions: Assess patient/caregiver ability to obtain necessary supplies Assess patient/caregiver ability to perform ulcer/skin care regimen upon admission and as needed Assess ulceration(s) every visit Provide education on ulcer and skin care Notes: 11/17/20: Wound  care regimen continues Electronic Signature(s) Signed: 08/03/2022 4:44:16 PM By: Zenaida Deed RN, BSN Entered By: Zenaida Deed on 08/03/2022 08:25:32 -------------------------------------------------------------------------------- Negative Pressure Wound Therapy Maintenance (NPWT) Details Patient Name: Date of Service: [name] D 08/03/2022 8:15 A M Medical Record Number: [id number] Patient Account Number: [id number] Date of Birth/Sex: Treating RN: [birth year] (36 y.o. [name] Primary Care Niala Stcharles: [name] Other Clinician: Referring Lianne Carreto: Treating Key Cen/Extender: [name] in Treatment: 144 NPWT Maintenance Performed for: Wound #3 Left, Lateral, Plantar Foot Additional Injuries Covered: No Performed By: Zenaida Deed, RN Type: VAC System Coverage Size (sq cm): 7.5 Pressure Type: Constant Pressure Setting: 125 mmHG Drain Type: None Primary Contact: Other : silver collagen Sponge/Dressing Type: Foam- Black Date Initiated: 02/11/2022 Dressing Removed: Yes Quantity of Sponges/Gauze Removed: 1 Canister Changed: No Canister Exudate Volume: 50 Dressing Reapplied: Yes Quantity of Sponges/Gauze Inserted: 1 Respones T Treatment: o good Days On NPWT : 174 Post Procedure Diagnosis Same as Pre-procedure Electronic Signature(s) Signed: 08/03/2022 4:44:16 PM By: Zenaida Deed RN, BSN Entered By: Zenaida Deed on 08/03/2022 08:40:23 [place], [name] ([id number]) [id number] Page 5 of 9 -------------------------------------------------------------------------------- Pain Assessment Details Patient Name: Date of Service: [name] D 08/03/2022 8:15 A M Medical Record Number: [id number] Patient Account Number: [id number] Date of Birth/Sex: Treating RN: 31-Jan-1986 (36 y.o. [name] Primary Care Angas [name]: [name] Other Clinician: Referring Weber Monnier: Treating Lariya Kinzie/Extender: [name] in Treatment: [phone] Active Problems Location of Pain Severity and Description of Pain Patient Has Paino No Site Locations Rate the pain. Current Pain Level: 0 Pain Management and Medication Current Pain Management: Electronic Signature(s) Signed: 08/03/2022 4:44:16 PM By: Zenaida Deed RN, BSN Entered By: Zenaida Deed on 08/03/2022 08:10:56 -------------------------------------------------------------------------------- Patient/Caregiver  Education Details Patient Name: Date of Service: [name], [name] 5/1/2024andnbsp8:15 A M Medical Record Number: [id number] Patient Account Number: [id number] Date of Birth/Gender: Treating RN: 1985-08-31 (36 y.o. [name] Primary Care Physician: [name] Other Clinician: Referring Physician: Treating Physician/Extender: [name] in Treatment: 144 Education Assessment Education Provided To: Patient Education Topics Provided Offloading: Methods: Explain/Verbal Responses: Reinforcements needed, State content correctly Wound/Skin Impairment: Methods: Explain/Verbal Responses: Reinforcements needed, State content correctly Electronic Signature(s) Signed: 08/03/2022 4:44:16 PM By: Zenaida Deed RN, BSN [place], [name] ([id number]) By: Zenaida Deed RN, BSN [phone].pdf Page 6 of 9 Signed: 08/03/2022 4:44:16 PM Entered By: Zenaida Deed on 08/03/2022 08:26:02 -------------------------------------------------------------------------------- Wound Assessment Details Patient Name: Date of Service: [name] D 08/03/2022 8:15 A M Medical Record Number: [id number] Patient Account Number: [id number] Date of Birth/Sex: Treating RN: [birth year] (36 y.o. [name] Primary Care Rayvin Abid: [name] Other Clinician: Referring Arshia Rondon: Treating Jaspreet Hollings/Extender: [name] in Treatment: 144 Wound Status Wound Number: 13 Primary Etiology: Pressure Ulcer Wound Location: Left Knee Secondary Etiology: Diabetic Wound/Ulcer of the Lower Extremity Wounding Event: Pressure Injury Wound Status: Open Date Acquired: 07/15/2022 Comorbid History: Type II Diabetes Weeks Of Treatment: 1 Clustered Wound: No Photos Wound Measurements Length: (cm) 0.2 Width: (cm) 2.2 Depth: (cm) 0.1 Area: (cm) 0.346 Volume: (cm) 0.035 % Reduction in Area: 73.3% % Reduction in Volume:  73.1% Epithelialization: Small (1-33%) Tunneling: No Undermining: No Wound Description Classification: Category/Stage III Wound Margin: Flat and Intact Exudate Amount: None Present Foul Odor After Cleansing: No Slough/Fibrino Yes Wound Bed Granulation Amount: None Present (0%) Exposed Structure Necrotic Amount: Large (67-100%) Fascia Exposed: No Necrotic Quality: Eschar Fat Layer (Subcutaneous Tissue) Exposed:  Yes Tendon Exposed: No Muscle Exposed: No Joint Exposed: No Bone Exposed: No Periwound Skin Texture Texture Color No Abnormalities Noted: Yes No Abnormalities Noted: Yes Moisture Temperature / Pain No Abnormalities Noted: Yes Temperature: No Abnormality Treatment Notes Wound #13 (Knee) Wound Laterality: Left Cleanser Peri-Wound Care Topical [place], [name] ([id number]) [id number] Page 7 of 9 Primary Dressing Maxorb Extra Ag+ Alginate Dressing, 4x4.75 (in/in) Discharge Instruction: Apply to wound bed as instructed Secondary Dressing Zetuvit Plus Silicone Border Dressing 4x4 (in/in) Discharge Instruction: Apply silicone border over primary dressing as directed. Secured With Compression Wrap Compression Stockings Facilities manager) Signed: 08/03/2022 4:44:16 PM By: Zenaida Deed RN, BSN Entered By: Zenaida Deed on 08/03/2022 08:23:37 -------------------------------------------------------------------------------- Wound Assessment Details Patient Name: Date of Service: [name], [name] 08/03/2022 8:15 A M Medical Record Number: [id number] Patient Account Number: [id number] Date of Birth/Sex: Treating RN: Apr 13, 1985 (36 y.o. [name] Primary Care Audreena Sachdeva: [name] Other Clinician: Referring Chace Klippel: Treating [place]il[place] Murthy/Extender: [name] in Treatment: 144 Wound Status Wound Number: 3 Primary Etiology: Diabetic Wound/Ulcer of the Lower Extremity Wound Location: Left,  Lateral, Plantar Foot Wound Status: Open Wounding Event: Trauma Comorbid History: Type II Diabetes Date Acquired: 10/02/2019 Weeks Of Treatment: 144 Clustered Wound: No Photos Wound Measurements Length: (cm) 2.5 Width: (cm) 3 Depth: (cm) 1.1 Area: (cm) 5.89 Volume: (cm) 6.48 % Reduction in Area: -257.2% % Reduction in Volume: -3827.3% Epithelialization: Small (1-33%) Wound Description Classification: Grade 2 Wound Margin: Well defined, not attached Exudate Amount: Medium Exudate Type: Serosanguineous Exudate Color: red, brown Foul Odor After Cleansing: No Slough/Fibrino Yes Wound Bed Granulation Amount: Large (67-100%) Exposed Structure Granulation Quality: Red Fascia Exposed: No Necrotic Amount: None Present (0%) Fat Layer (Subcutaneous Tissue) Exposed: Yes Tendon Exposed: No Muscle Exposed: No [place], [name] ([id number]) [id number] Page 8 of 9 Joint Exposed: No Bone Exposed: No Periwound Skin Texture Texture Color No Abnormalities Noted: Yes No Abnormalities Noted: Yes Moisture Temperature / Pain No Abnormalities Noted: No Temperature: No Abnormality Dry / Scaly: No Maceration: Yes Treatment Notes Wound #3 (Foot) Wound Laterality: Plantar, Left, Lateral Cleanser Soap and Water Discharge Instruction: May shower and wash wound with dial antibacterial soap and water prior to dressing change. Vashe 5.8 (oz) Discharge Instruction: Cleanse the wound with Vashe prior to applying a clean dressing, let sit on wound for 5-10 minutesusing gauze sponges, not tissue or cotton balls. Peri-Wound Care Skin Prep Discharge Instruction: Use skin prep as directed Topical keystone antibiotic compound Discharge Instruction: apply to base of wound Primary Dressing Promogran Prisma Matrix, 4.34 (sq in) (silver collagen) Discharge Instruction: Moisten collagen with saline or hydrogel VAC Secondary Dressing Zetuvit Absorbent Pad, 4x4  (in/in) Secured With Elastic Bandage 4 inch (ACE bandage) Discharge Instruction: Secure with ACE bandage as directed. 1M Medipore Soft Cloth Surgical T 2x10 (in/yd) ape Discharge Instruction: Secure with tape as directed. Compression Wrap Kerlix Roll 4.5x3.1 (in/yd) Discharge Instruction: Apply Kerlix and Coban compression as directed. Compression Stockings Add-Ons Electronic Signature(s) Signed: 08/03/2022 4:44:16 PM By: Zenaida Deed RN, BSN Entered By: Zenaida Deed on 08/03/2022 60:63:01 -------------------------------------------------------------------------------- Vitals Details Patient Name: Date of Service: [name], [name] 08/03/2022 8:15 A M Medical Record Number: [id number] Patient Account Number: [id number] Date of Birth/Sex: Treating RN: 31-Aug-1985 (36 y.o. [name] Primary Care Tylasia Fletchall: [name] Other Clinician: Referring Raj Landress: Treating Amare Bail/Extender: [name] in Treatment: 144 Vital Signs Time Taken: 08:09 Temperature (F): 97.9 Height (in): 77 Pulse (bpm): 105 Weight (lbs): 280 Respiratory  Rate (breaths/min): 18 Body Mass Index (BMI): 33.2 Blood Pressure (mmHg): 137/82 [place], [name] ([id number]) [id number] Page 9 of 9 Capillary Blood Glucose (mg/dl): 528 Reference Range: 80 - 120 mg / dl Notes glucose per pt report this am Electronic Signature(s) Signed: 08/03/2022 4:44:16 PM By: Zenaida Deed RN, BSN Entered By: Zenaida Deed on 08/03/2022 08:10:48

## 2022-08-10 ENCOUNTER — Encounter (HOSPITAL_BASED_OUTPATIENT_CLINIC_OR_DEPARTMENT_OTHER): Payer: BC Managed Care – PPO | Admitting: General Surgery

## 2022-08-10 DIAGNOSIS — E11621 Type 2 diabetes mellitus with foot ulcer: Secondary | ICD-10-CM | POA: Diagnosis not present

## 2022-08-10 NOTE — Progress Notes (Signed)
[place], [name] ([id number]) [id number] Page 1 of 23 Visit Report for 08/10/2022 Chief Complaint Document Details Patient Name: Date of Service: [name] D 08/10/2022 8:15 A M Medical Record Number: [id number] Patient Account Number: [id number] Date of Birth/Sex: Treating RN: 1985/10/06 (37 y.o. M) Primary Care Provider: [name] Other Clinician: Referring Provider: Treating Provider/Extender: [name] in Treatment: [phone] Information Obtained from: Patient Chief Complaint 01/11/2019; patient is here for review of a rather substantial wound over Max left fifth plantar metatarsal head extending into Max lateral part of his foot 10/24/2019; patient returns to clinic with wounds on his bilateral feet with underlying osteomyelitis biopsy-proven Electronic Signature(s) Signed: 08/10/2022 9:16:35 AM By: Duanne Guess MD FACS Entered By: Duanne Guess on 08/10/2022 09:16:34 -------------------------------------------------------------------------------- Debridement Details Patient Name: Date of Service: [name], [name] 08/10/2022 8:15 A M Medical Record Number: [id number] Patient Account Number: [id number] Date of Birth/Sex: Treating RN: 1985/09/08 (37 y.o. [name] Primary Care Provider: [name] Other Clinician: Referring Provider: Treating Provider/Extender: [name] in Treatment: 145 Debridement Performed for Assessment: Wound #13 Left Knee Performed By: Physician Duanne Guess, MD Debridement Type: Debridement Severity of Tissue Pre Debridement: Fat layer exposed Level of Consciousness (Pre-procedure): Awake and Alert Pre-procedure Verification/Time Out Yes - 09:05 Taken: Start Time: 09:07 Pain Control: Lidocaine 4% Topical Solution Percent of Wound Bed Debrided: 100% T Area Debrided (cm): otal 0.31 Tissue and other material debrided: Non-Viable, Eschar, Slough,  Slough Level: Non-Viable Tissue Debridement Description: Selective/Open Wound Instrument: Curette Bleeding: Minimum Hemostasis Achieved: Pressure Procedural Pain: 0 Post Procedural Pain: 0 Response to Treatment: Procedure was tolerated well Level of Consciousness (Post- Awake and Alert procedure): Post Debridement Measurements of Total Wound Length: (cm) 0.2 Stage: Category/Stage III Width: (cm) 2 Depth: (cm) 0.1 Volume: (cm) 0.031 Character of Wound/Ulcer Post Debridement: Improved Severity of Tissue Post Debridement: Fat layer exposed Post Procedure Diagnosis Same as Pre-procedure Notes Scribed for Dr. Lady Gary by Zenaida Deed, RN [place], [name] ([id number]) [id number] Page 2 of 23 Electronic Signature(s) Signed: 08/10/2022 10:37:02 AM By: Duanne Guess MD FACS Signed: 08/10/2022 4:26:39 PM By: Zenaida Deed RN, BSN Entered By: Zenaida Deed on 08/10/2022 09:12:07 -------------------------------------------------------------------------------- Debridement Details Patient Name: Date of Service: [name], [name] 08/10/2022 8:15 A M Medical Record Number: [id number] Patient Account Number: [id number] Date of Birth/Sex: Treating RN: 02-Sep-1985 (37 y.o. [name] Primary Care Provider: [name] Other Clinician: Referring Provider: Treating Provider/Extender: [name] in Treatment: 145 Debridement Performed for Assessment: Wound #3 Left,Lateral,Plantar Foot Performed By: Physician Duanne Guess, MD Debridement Type: Debridement Severity of Tissue Pre Debridement: Fat layer exposed Level of Consciousness (Pre-procedure): Awake and Alert Pre-procedure Verification/Time Out Yes - 09:05 Taken: Start Time: 09:07 Pain Control: Lidocaine 4% Topical Solution Percent of Wound Bed Debrided: 100% T Area Debrided (cm): otal 5.65 Tissue and other material debrided: Non-Viable, Callus, Skin: Epidermis,  Biofilm Level: Skin/Epidermis Debridement Description: Selective/Open Wound Instrument: Curette Bleeding: Minimum Hemostasis Achieved: Pressure Procedural Pain: 0 Post Procedural Pain: 0 Response to Treatment: Procedure was tolerated well Level of Consciousness (Post- Awake and Alert procedure): Post Debridement Measurements of Total Wound Length: (cm) 2.4 Width: (cm) 3 Depth: (cm) 1 Volume: (cm) 5.655 Character of Wound/Ulcer Post Debridement: Improved Severity of Tissue Post Debridement: Fat layer exposed Post Procedure Diagnosis Same as Pre-procedure Notes Scribed for Dr. Lady Gary by Zenaida Deed, RN Electronic Signature(s) Signed: 08/10/2022 10:37:02 AM By: Duanne Guess MD FACS Signed: 08/10/2022 4:26:39 PM  By: Zenaida Deed RN, BSN Entered By: Zenaida Deed on 08/10/2022 09:15:14 -------------------------------------------------------------------------------- HPI Details Patient Name: Date of Service: [name], [name] 08/10/2022 8:15 A M Medical Record Number: [id number] Patient Account Number: [id number] Date of Birth/Sex: Treating RN: 1985/09/17 (37 y.o. 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 He comes in for review of a wound over Max plantar fifth metatarsal head extending into Max lateral part of Max foot. He was followed for this previously by his podiatrist Dr. Darreld Mclean. As Max patient tells his story he went to see podiatry first for a swelling he developed on Max lateral part of his fifth metatarsal head in May. He states this was "open" by podiatry and Max area closed. He was followed up in June and it was again  opened callus removed and it closed promptly. There were plans being made for surgery on Max fifth metatarsal head in June however his blood sugar was apparently too high for anesthesia. Apparently Max area was debrided and opened again in June and it is never closed since. Looking over Max records from podiatry I am really not able to follow this. It was clear when he was first seen it was before 5/14 at that point he already had a wound. By 5/17 Max ulcer was resolved. I do not see anything about a procedure. On 5/28 noted to have pre-ulcerative moderate keratosis. X-ray noted 1/5 contracted toe and tailor's bunion and metatarsal deformity. On a visit date on 09/28/2018 Max dorsal part of Max left foot it healed and resolved. There was concern about swelling in his lower extremity he was sent to Max ER.. As far as I can tell he was seen in Max ER on 7/12 with an ulcer on his left foot. A DVT rule out of Max left leg was negative. I do not think I have complete records from podiatry but I am not able to verify Max procedures this patient states he had. He states after Max last procedure Max wound has never closed although I am not able to follow this in Max records I have from podiatry. He has not had a recent x-ray Max patient has been using Neosporin on Max wound. He is wearing a Darco shoe. He is still very active up on his foot working and exercising. Past medical history; type 2 diabetes ketosis-prone, leg swelling with a negative DVT study in July. Non-smoker ABI in our clinic was 0.85 on Max left 10/16; substantial wound on Max plantar left fifth met head extending laterally almost to Max dorsal fifth MTP. We have been using silver alginate we gave him a Darco forefoot off loader. An x-ray did not show evidence of osteomyelitis did [name] soft tissue emphysema Max I think was due to gas tracking through an open wound. There is no doubt in my mind he requires an MRI 10/23; MRI not booked until 3  November at Max earliest this is largely due to his glucose sensor in Max right arm. We have been using silver alginate. There has been an improvement 10/29; I am still not exactly sure when his MRI is booked for. He says it is Max third but it is Max 10th in epic. This definitely needs to be done.  He is running a low-grade fever today but no other symptoms. No real improvement in Max 1 02/26/2019 patient presents today for a follow-up visit here in our clinic he is last been seen in Max clinic on October 29. Subsequently we were working on getting MRI to evaluate and see what exactly was going on and where we would need to go from Max standpoint of whether Max not he had osteomyelitis and again what treatments were going be required. Subsequently Max patient ended up being admitted to Max hospital on 02/07/2019 and was discharged on 02/14/2019. This is a somewhat interesting admission with a discharge diagnosis of pneumonia due to COVID-19 although he was positive for COVID-19 when tested at Max urgent care but negative x2 when he was actually in Max hospital. With that being said he did have acute respiratory failure with hypoxia and it was noted he also have a left foot ulceration with osteomyelitis. With that being said he did require oxygen for his pneumonia and I level 4 L. He was placed on antivirals and steroids for Max COVID-19. He was also transferred to Max [facility] campus at one point. Nonetheless he did subsequently discharged home and since being home has done much better in that regard. Max CT angiogram did not show any pulmonary embolism. With regard to Max osteomyelitis Max patient was placed on vancomycin and Zosyn while in Max hospital but has been changed to Augmentin at discharge. It was also recommended that he follow- up with wound care and podiatry. Podiatry however wanted him to see [name] according to Max patient prior to them doing anything further. His hemoglobin A1c was 9.9  as noted in Max hospital. Have an MRI of Max left foot performed while in Max hospital on 02/04/2019. This showed evidence of septic arthritis at Max fifth MTP joint and osteomyelitis involving Max fifth metatarsal head and proximal phalanx. There is an overlying plantar open wound noted an abscess tracking back along Max lateral aspect of Max fifth metatarsal shaft. There is otherwise diffuse cellulitis and mild fasciitis without findings of polymyositis. Max patient did have recently pneumonia secondary to COVID-19 I looked in Max chart through epic and it does appear that Max patient may need to have an additional x-ray just to ensure everything is cleared and that he has no airspace disease prior to putting him into Max chamber. 03/05/2019; patient was readmitted to Max clinic last week. He was hospitalized twice for a viral upper respiratory tract infection from 11/1 through 11/4 and then 11/5 through 11/12 ultimately this turned out to be Covid pneumonitis. Although he was discharged on oxygen he is not using it. He says he feels fine. He has no exercise limitation no cough no sputum. His O2 sat in our clinic today was 100% on room air. He did manage to have his MRI Max showed septic arthritis at Max fifth MTP joint and osteomyelitis involving Max fifth metatarsal head and proximal phalanx. He received Vanco and Zosyn in Max hospital and then was discharged on 2 weeks of Augmentin. I do not see any relevant cultures. He was supposed to follow-up with infectious disease but I do not see that he has an appointment. 12/8; patient saw Dr. Ronnie Derby of infectious disease last week. He felt that he had had adequate antibiotic therapy. He did not go to follow-up with Dr. Logan Bores of podiatry and I have again talked to him about Max pros and cons of this. He does not want to consider  a ray amputation of this time. He is aware of Max risks of recurrence, migration etc. He started HBO today and tolerated this  well. He can complete Max Augmentin that I gave him last week. I have looked over Max lab work that Dr. Effie Shy ordered his C-reactive protein was 3.3 and his sedimentation rate was 17. Max C-reactive protein is never really been measurably that high in this patient 12/15; not much change in Max wound today however he has undermining along Max lateral part of Max foot again more extensively than last week. He has some rims of epithelialization. We have been using silver alginate. He is undergoing hyperbarics but did not dive today 12/18; in for his obligatory first total contact cast change. Unfortunately there was pus coming from Max undermining area around his fifth metatarsal head. This was cultured but will preclude reapplication of a cast. He is seen in conjunction with HBO 12/24; patient had staph lugdunensis in Max wound in Max undermining area laterally last time. We put him on doxycycline Max should have covered this. Max wound looks better today. I am going to give him another week of doxycycline before reattempting Max total contact cast 12/31; Max patient is completing antibiotics. Hemorrhagic debris in Max distal part of Max wound with some undermining distally. He also had hyper granulation. Extensive debridement with a #5 curette. Max infected area that was on Max lateral part of Max fifth met head is closed over. I do not think he needs any more antibiotics. Patient was seen prior to HBO. Preparations for a total contact cast were made in Max cast will be placed post hyperbarics 04/11/19; once again Max patient arrives today without complaint. He had been in a cast all week noted that he had heavy drainage this week. This resulted in large raised areas of macerated tissue around Max wound 1/14; wound bed looks better slightly smaller. Hydrofera Blue has been changing himself. He had a heavy drainage last week Max caused a lot of maceration around Max wound so I took him out of a  total contact cast he says Max drainage is actually better this week He is seen today in conjunction with HBO 1/21; returns to clinic. He was up in [place] for a day Max 2 attending a funeral. He comes back in with Max wound larger and with a large area of exposed bone. He had osteomyelitis and septic arthritis of Max fifth left metatarsal head while he was in hospital. He received IV antibiotics in Max hospital for a prolonged period of time then 3 weeks of Augmentin. Subsequently I gave him 2 weeks of doxycycline for more superficial wound infection. When I saw this last week Max wound was smaller Max surface of Max wound looks satisfactory. 1/28; patient missed hyperbarics today. Bone biopsy I did last time showed Enterococcus faecalis and Staphylococcus lugdunensis . He has a wide area of exposed bone. We are going to use silver alginate as of today. I had another ethical discussion with Max patient. This would be recurrent osteomyelitis he is already received IV antibiotics. In this situation I think Max likelihood of healing this is low. Therefore I have recommended a ray amputation and with Max patient's agreement I have referred him to Dr. Victorino Dike. Max other issue is that his compliance with hyperbarics has been minimal because of his work schedule and given his underlying decision I am going to stop this today READMISSION 10/24/2019 MRI 09/29/2019 left foot IMPRESSION: 1. Apparent skin ulceration  inferior and lateral to Max 5th [place], [name] ([id number]) [id number] Page 4 of 23 metatarsal base with underlying heterogeneous T2 signal and enhancement in Max subcutaneous fat. Small peripherally enhancing fluid collections along Max plantar and lateral aspects of Max 5th metatarsal base suspicious for abscesses. 2. Interval amputation through Max mid 5th metatarsal with nonspecific low-level marrow edema and enhancement. Given Max proximity to Max adjacent soft  tissue inflammatory changes, osteomyelitis cannot be excluded. 3. Max additional bones appear unremarkable. MRI 09/29/2019 right foot IMPRESSION: 1. Soft tissue ulceration lateral to Max 5th MTP joint. There is low-level T2 hyperintensity within Max 4th and 5th metatarsal heads and adjacent proximal phalanges without abnormal T1 signal Max cortical destruction. These findings are nonspecific and could be seen with early marrow edema, hyperemia Max early osteomyelitis. No evidence of septic joint. 2. Mild tenosynovitis and synovial enhancement associated with Max extensor digitorum tendons at Max level of Max midfoot. 3. Diffuse low-level muscular T2 hyperintensity and enhancement, most consistent with diabetic myopathy. LEFT FOOT BONE Methicillin resistant staphylococcus aureus Staphylococcus lugdunensis MIC MIC CIPROFLOXACIN >=8 RESISTANT Resistant <=0.5 SENSI... Sensitive CLINDAMYCIN <=0.25 SENS... Sensitive >=8 RESISTANT Resistant ERYTHROMYCIN >=8 RESISTANT Resistant >=8 RESISTANT Resistant GENTAMICIN <=0.5 SENSI... Sensitive <=0.5 SENSI... Sensitive Inducible Clindamycin NEGATIVE Sensitive NEGATIVE Sensitive OXACILLIN >=4 RESISTANT Resistant 2 SENSITIVE Sensitive RIFAMPIN <=0.5 SENSI... Sensitive <=0.5 SENSI... Sensitive TETRACYCLINE <=1 SENSITIVE Sensitive <=1 SENSITIVE Sensitive TRIMETH/SULFA <=10 SENSIT Sensitive <=10 SENSIT Sensitive ... Marland Kitchen.. VANCOMYCIN 1 SENSITIVE Sensitive <=0.5 SENSI... Sensitive Right foot bone . Component 3 wk ago Specimen Description BONE Special Requests RIGHT 4 METATARSAL SAMPLE B Gram Stain NO WBC SEEN NO ORGANISMS SEEN Culture RARE METHICILLIN RESISTANT STAPHYLOCOCCUS AUREUS NO ANAEROBES ISOLATED Performed at [facility], [address], [place], [place] [address] Report Status 10/08/2019 FINAL Organism ID, Bacteria METHICILLIN RESISTANT STAPHYLOCOCCUS AUREUS Resulting Agency [facility] Susceptibility Methicillin resistant  staphylococcus aureus MIC CIPROFLOXACIN >=8 RESISTANT Resistant CLINDAMYCIN <=0.25 SENS... Sensitive ERYTHROMYCIN >=8 RESISTANT Resistant GENTAMICIN <=0.5 SENSI... Sensitive Inducible Clindamycin NEGATIVE Sensitive OXACILLIN >=4 RESISTANT Resistant RIFAMPIN <=0.5 SENSI... Sensitive TETRACYCLINE <=1 SENSITIVE Sensitive TRIMETH/SULFA <=10 SENSIT Sensitive ... VANCOMYCIN 1 SENSITIVE Sensitive This is a patient we had in clinic earlier this year with a wound over his left fifth metatarsal head. 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 Victorino Dike on 05/16/2019. He seems to have developed problems with wounds on his bilateral feet in June 2021 although he may have had problems earlier than this. He was in an urgent care with a right foot ulcer on 09/26/2019 and given a course of doxycycline. This was apparently after having trouble getting into see orthopedics. He was seen by podiatry on 09/28/2019 noted to have bilateral lower extremity ulcers including Max left lateral fifth metatarsal base and Max right subfifth met head. It was noted that had purulent drainage at that time. He required hospitalization from 6/20 through 7/2. This was because of worsening right foot wounds. He underwent bilateral operative incision and drainage and bone biopsies bilaterally. Culture results are listed above. He has been referred back to clinic by Dr. Ardelle Anton of podiatry. He is also followed by Dr. Orvan Falconer who saw him yesterday. He was discharged from hospital on Zyvox Flagyl and Levaquin and yesterday changed to doxycycline Flagyl  and Levaquin. His inflammatory markers on 6/26 showed a sedimentation rate of 129 and a C-reactive protein of  5. This is improved to 14 and 1.3 respectively. This would indicate improvement. ABIs in our clinic today were 1.23 on Max right and 1.20 on Max left 11/01/2019 on evaluation today patient appears to be doing fairly well in regard to Max wounds on his feet at this point. Fortunately there is no signs of active infection at this time. No fevers, chills, nausea, vomiting, Max diarrhea. He currently is seeing infectious disease and still under their care at this point. Subsequently he also has both wounds Max she has not been using collagen on as he did not receive that in his packaging he did not call us and let us know that. Apparently that just was missed on Max order. Nonetheless we will get that straightened out today. 8/9-Patient returns for bilateral foot wounds, using Prisma with hydrogel moistened dressings, and Max wounds appear stable. Patient using surgical shoes, avoiding much pressure Max weightbearing as much as possible 8/16; patient has bilateral foot wounds. 1 on Max right lateral foot proximally Max other is on Max left mid lateral foot. Both required debridement of callus and thick skin around Max wounds. We have been using silver collagen 8/27; patient has bilateral lateral foot wounds. Max area on Max left substantially surrounded by callus and dry skin. This was removed from Max wound edge. Max underlying wound is small. Max area on Max right measured somewhat smaller today. We've been using silver collagen [place], [name] ([id number]) [id number] Page 5 of 23 Max patient was on antibiotics for underlying osteomyelitis in Max left foot. Unfortunately I did not update his antibiotics during today's visit. 9/10 I reviewed Dr. Blair Dolphin last notes he felt he had completed antibiotics his inflammatory markers were reasonably well controlled. He has a small wound on Max lateral left foot and a tiny area on Max right Max is just above closed. He is using  Hydrofera Blue with border foam he has bilateral surgical shoes 9/24; 2 week f/u. doing well. right foot is closed. left foot still undermined. 10/14; right foot remains closed at Max fifth met head. Max area over Max base of Max left fifth metatarsal has a small open area but considerable undermining towards Max plantar foot. Thick callus skin around this suggests an adequate pressure relief. We have talked about this. He says he is going to go back into his cam boot. I suggested a total contact cast he did not seem enamored with this suggestion 10/26; left foot base of Max fifth metatarsal. Same condition as last time. He has skin over Max area with an open wound however Max skin is not adherent. He went to see Dr. Loreta Ave who did an x-ray and culture of his foot I have not reviewed Max x-ray but Max patient was not told anything. He is on doxycycline 11/11; since Max patient was last here he was in Max emergency room on 10/30 he was concerned about swelling in Max left foot. They did not do any cultures Max x-rays. They changed his antibiotics to cephalexin. Previous culture showed group B strep. Max cephalexin is appropriate as doxycycline has less than predictable coverage. Arrives in clinic today with swelling over this area under Max wound. He also has a new wound on Max right fifth metatarsal head 11/18; Max patient has a difficult wound on Max lateral aspect of Max left fifth metatarsal head. Max wound was almost ballotable last  week I opened it slightly expecting to see purulence however there was just bleeding. I cultured this this was negative. X-ray unchanged. We are trying to get an MRI but I am not sure were going to be able to get this through his insurance. He also has an area on Max right lateral fifth metatarsal head this looks healthier 12/3; Max patient finally got our MRI. Surprisingly this did not show osteomyelitis. I did show Max soft tissue ulceration at Max lateral plantar  aspect of Max fifth metatarsal base with a tiny residual 6 mm abscess overlying Max superficial fascia I have tried to culture this area I have not been able to get this to grow anything. Nevertheless Max protruding tissue looks aggravated. I suspect we should try to treat Max underlying "abscess with broad-spectrum antibiotics. I am going to start him on Levaquin and Flagyl. He has much less edema in his legs and I am going to continue to wrap his legs and see him weekly 12/10. I started Levaquin and Flagyl on him last week. He just picked up Max Flagyl apparently there was some delay. Max worry is Max wound on Max left fifth metatarsal base Max is substantial and worsening. His foot looks like he inverts at Max ankle making this a weightbearing surface. Certainly no improvement in fact I think Max measurements of this are somewhat worse. We have been using 12/17; he apparently just got Max Levaquin yesterday this is 2 weeks after Max fact. He has completed Max Flagyl. Max area over Max left fifth metatarsal base still has protruding granulation tissue although it does not look quite as bad as it did some weeks ago. He has severe bilateral lymphedema although we have not been treating him for wounds on his legs this is definitely going to require compression. There was so much edema in Max left I did not wish to put him in a total contact cast today. I am going to increase his compression from 3-4 layer. Max area on Max right lateral fifth met head actually look quite good and superficial. 12/23; patient arrived with callus on Max right fifth met head and Max substantial hyper granulated callused wound on Max base of his fifth metatarsal. He says he is completing his Levaquin in 2 days but I do not think that adds up with what I gave him but I will have to double check this. We are using Hydrofera Blue on both areas. My plan is to put Max left leg in a cast Max week after New Year's 04/06/2020;  patient's wounds about Max same. Right lateral fifth metatarsal head and left lateral foot over Max base of Max fifth metatarsal. There is undermining on Max left lateral foot Max I removed before application of total contact cast continuing with Hydrofera Blue new. Patient tells me he was seen by endocrinology today lab work was done [Dr. Kerr]. Also wondering whether he was referred to cardiology. I went over some lab work from previously does not have chronic renal failure certainly not nephrotic range proteinuria he does have very poorly controlled diabetes but this is not his most updated lab work. Hemoglobin A1c has been over 11 1/10; Max patient had a considerable amount of leakage towards mid part of his left foot with macerated skin however Max wound surface looks better Max area on Max right lateral fifth met head is better as well. I am going to change Max dressing on Max left foot under Max total contact cast  to silver alginate, continue with Hydrofera Blue on Max right. 1/20; patient was in Max total contact cast for 10 days. Considerable amount of drainage although Max skin around Max wound does not look too bad on Max left foot. Max area on Max right fifth metatarsal head is closed. Our nursing staff reports large amount of drainage out of Max left lateral foot wound 1/25; continues with copious amounts of drainage described by our intake staff. PCR culture I did last week showed E. coli and Enterococcus faecalis and low quantities. Multiple resistance genes documented including extended spectrum beta lactamase, MRSA, MRSE, quinolone, tetracycline. Max wound is not quite as good this week as it was 5 days ago but about Max same size 2/3; continues with copious amounts of malodorous drainage per our intake nurse. Max PCR culture I did 2 weeks ago showed E. coli and low quantities of Enterococcus. There were multiple resistance genes detected. I put Neosporin on him last week although  this does not seem to have helped. Max wound is slightly deeper today. Offloading continues to be an issue here although with Max amount of drainage she has a total contact cast is just not going to work 2/10; moderate amount of drainage. Patient reports he cannot get his stocking on over Max dressing. I told him we have to do that Max nurse gave him suggestions on how to make this work. Max wound is on Max bottom and lateral part of his left foot. Is cultured predominantly grew low amounts of Enterococcus, E. coli and anaerobes. There were multiple resistance genes detected including extended spectrum beta lactamase, quinolone, tetracycline. I could not think of an easy oral combination to address this so for now I am going to do topical antibiotics provided by [facility] I think Max main agents here are vancomycin and an aminoglycoside. We have to be able to give him access to Max wounds to get Max topical antibiotic on 2/17; moderate amount of drainage this is unchanged. He has his Keystone topical antibiotic against Max deep tissue culture organisms. He has been using this and changing Max dressing daily. Silver alginate on Max wound surface. 2/24; using Keystone antibiotic with silver alginate on Max top. He had too much drainage for a total contact cast at one point although I think that is improving and I think in Max next week Max 2 it might be possible to replace a total contact cast I did not do this today. In general Max wound surface looks healthy however he continues to have thick rims of skin and subcutaneous tissue around Max wide area of Max circumference Max I debrided 06/04/2020 upon evaluation today patient appears to be doing well in regard to his wound. I do feel like he is showing signs of improvement. There is little bit of callus and dead tissue around Max edges of Max wound as well as what appears to be a little bit of a sinus tract that is off to Max side laterally I would  perform debridement to clear that away today. 3/17; left lateral foot. Max wound looks about Max same as I remember. Not much depth surface looks healthy. No evidence of infection 3/25; left lateral foot. Wound surface looks about Max same. Separating epithelium from Max circumference. There really is no evidence of infection here however not making progress by my view 3/29; left lateral foot. Surface of Max wound again looks reasonably healthy still thick skin and subcutaneous tissue around Max wound margins. There is  no evidence of infection. One of Max concerns being brought up by Max nurses has again Max amount of drainage vis--vis continued use of a total contact cast 4/5; left lateral foot at roughly Max base of Max fifth metatarsal. Nice healthy looking granulated tissue with rims of epithelialization. Max overall wound measurements are not any better but Max tissue looks healthy. Max only concern is Max amount of drainage although he has no surrounding maceration with what we have been doing recently to absorb fluid and protect his skin. He also has lymphedema. 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 He states that Max kerlix/coban under Max TCC rolled down and could not get it back up. He has been using an offloading scooter and has somehow hurt his right foot using this device. This happened last week. He states that Max side of his right foot developed a blister and opened. Max top of his foot also has a few small open wounds he thinks is due to his socks rubbing in his shoes. He has not been using any dressings to Max wound. He denies purulent drainage, fever/chills Max erythema to Max wounds. 4/22; patient presents for 1 week follow-up. He  developed new wounds to Max right foot that were evaluated at last clinic visit. He continues to have a total contact cast to Max left leg and he reports no issues. He has been using silver collagen to Max right foot wounds with no issues. He denies purulent drainage, fever/chills Max erythema to Max right foot wounds. He has no complaints today 4/25; patient presents for 1 week follow-up. He has a total contact cast of Max left leg and reports no issues. He has been using silver alginate to Max right foot wound. He denies purulent drainage, fever/chills Max erythema to Max right foot wounds. 5/2 patient presents for 1 week follow-up. T contact cast on Max left. Max wound Max is on Max base of Max plantar foot at Max base of Max fifth metatarsal [place], [name] ([id number]) [id number] Page 6 of 23 actually looks quite good and dimensions continue to gradually contract. HOWEVER Max area on Max right lateral fifth metatarsal head is much larger than what I remember from 2 weeks ago. Once more is he has significant levels of hypergranulation. Noteworthy that he had this same hyper granulated response on his wound on Max left foot at one point in time. So much so that he I thought there was an underlying fluid collection. Based on this I think this just needs debridement. 5/9; Max wound on Max left actually continues to be gradually smaller with a healthy surface. Slight amount of drainage and maceration of Max skin around but not too bad. However he has a large wound over Max right fifth metatarsal head very much in Max same configuration as his left foot wound was initially. I used silver nitrate to address Max hyper granulated tissue no mechanical debridement 5/16; area on Max left foot did not look as healthy this week deeper thick surrounding macerated skin and subcutaneous tissue. Max area on Max right foot fifth met head was about Max same Max area on Max right  ankle that we identified last week is completely broken down into an  open wound presumably a stocking rubbing issue 5/23; patient has been using a total contact cast to Max left side. He has been using silver alginate underneath. He has also been using silver alginate to Max right foot wounds. He has no complaints today. He denies any signs of infection. 5/31; Max left-sided wound looks some better measure smaller surface granulation looks better. We have been using silver alginate under Max total contact cast Max large area on his right fifth met head and right dorsal foot look about Max same still using silver alginate 6/6; neither side is good as I was hoping although Max surface area dimensions are better. A lot of maceration on his left and right foot around Max wound edge. Area on Max dorsal right foot looks better. He says he was traveling. I am not sure what does Max amount of maceration around Max plantar wounds may be drainage issues 6/13; in general Max wound surfaces look quite good on both sides. Macerated skin and raised edges around Max wound required debridement although in general especially on Max left Max surface area seems improved. Max area on Max right dorsal ankle is about Max same I thought this would not be such a problem to close 6/20; not much change in either wound although Max one on Max right looks a little better. Both wounds have thick macerated edges to Max skin requiring debridements. We have been using silver alginate. Max area on Max dorsal right ankle is still open I thought this would be closed. 6/28; patient comes in today with a marked deterioration in Max right foot wound fifth met head. Wide area of exposed bone this is a drastic change from last time. Max area on Max left there we have been casting is stagnant. We have been using silver alginate in both wound areas. 7/5; bone culture I did for PCR last time was positive for Pseudomonas, group B strep,  Enterococcus and Staph aureus. There was no suggestion of methicillin resistance Max ampicillin resistant genes. This was resistant to tetracycline however He comes into Max clinic today with Max area over his right plantar fifth metatarsal head Max had been doing so well 2 weeks ago completely necrotic feeling bone. I do not know that this is going to be salvageable. Max left foot wound is certainly no smaller but it has a better surface and is superficial. 7/8; patient called in this morning to say that his total contact cast was rubbing against his foot. He states he is doing fine overall. He denies signs of infection. 7/12; continued deterioration in Max wound over Max right fifth metatarsal head crumbling bone. This is not going to be salvageable. Max patient agrees and wants to be referred to Dr. Victorino Dike Max we will attempt to arrange as soon as possible. I am going to continue him on antibiotics as long as that takes so I will renew those today. Max area on Max left foot Max is Max base of Max fifth metatarsal continues to look somewhat better. Healthy looking tissue no depth no debridement is necessary here. 7/20; Max patient was kindly seen by Dr. Victorino Dike of orthopedics on 10/19/2020. He agreed that he needed a ray amputation on Max right and he said he would have a look at Max fourth as well while he was intraoperative. Towards this end we have taken him out of Max total contact cast on Max left we will put him in a wrap with Hydrofera Blue. As I understand things  surgery is planned for 7/21 7/27; patient had his surgery last Thursday. 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 Apparently everything went well we did not still disturb that today Max area on Max left foot actually looks quite good. He has been much less mobile Max probably explains this he did not seem to do well in Max total contact cast secondary to drainage and maceration I think. We have been using Hydrofera  Blue 11/09/2020 upon evaluation today patient appears to be doing well with regard to his plantar foot ulcer on Max left foot. Fortunately there is no evidence of active infection at this time. No fevers, chills, nausea, vomiting, Max diarrhea. Overall I think that he is actually doing extremely well. Nonetheless I do believe that he is staying off of this more following Max surgery in his right foot that is Max reason Max left is doing so great. 8/16; left plantar foot wound. This looks smaller than Max last time I saw this he is using Hydrofera Blue. Max surgical wound on Max right foot is being followed by Dr. Victorino Dike we did not look at this today. He has surgical shoes on both feet 8/23; left plantar foot wound not as good this week. Surrounding macerated skin and subcutaneous tissue everything looks moist and wet. I do not think he is offloading this adequately. He is using a surgical shoe Apparently Max right foot surgical wound is not open although I did not check his foot 8/31; left plantar foot lateral aspect. Much improved this week. He has no maceration. Some improvement in Max surface area of Max wound but most impressively Max depth is come in we are using silver alginate. 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 He is asked that we write him a letter so he can go back to work. I have also tried to see if we can write something that will allow him to limit Max amount of time that he is on his foot at work. 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 Hopefully Max latter can be avoided 9/6; I believe Max patient missed an appointment last week. He arrives in today with a wound looking roughly Max same certainly no better. Undermining laterally and also inferiorly. We used molecuLight today in training with Max patient's permission.. We are using silver alginate 9/21 wound is measuring bigger this week although this  may have to do with Max aggressive circumferential debridement last week in response to Max blush fluorescence on Max MolecuLight. Culture I did last week showed significant MSSA and E. coli. I put him on Augmentin but he has not started it yet. We are also going to send this for compounded antibiotics at [facility], Max. There is no evidence of systemic infection 9/29; silver alginate. His Keystone arrived. He is completing Augmentin in 2 days. Offloading in a cam boot. Moderate drainage per our intake staff 10/5; using silver alginate. He has been using his [place]. He has completed his Augmentin. Per our intake nurse still a lot of drainage, far too much to consider a total contact cast. Wound measures about Max same. He had Max same undermining area that I defined last week from a roughly 11-3. I remove this today 10/12; using silver alginate he is using Max [place]. He comes in for a nurse visit hence we are applying Jodie Echevaria twice a week. Measuring slightly better today and less notable drainage. Extensive  debridement of Max wound edge last time 10/18; using topical Keystone and silver alginate and a soft cast. Wound measurements about Max same. Drainage was through his soft cast. We are changing this twice a week Tuesdays and Friday 10/25; comes in with moderate drainage. Still using Keystone silver alginate and a soft cast. Wound dimensions completely Max same.He has a lot of edema in Max left leg he has lymphedema. Asking for [name] to consider wrapping him as he cannot get his stocking on over Max soft cast 11/2; comes in with moderate to large drainage slightly smaller in terms of width we have been using [place]. His wound looks satisfactory but not much improvement 11/4; patient presents today for obligatory cast change. Has no issues Max complaints today. He denies signs of infection. 11/9; patient traveled this weekend to [place], was on Max cast quite a bit. Staining of Max cast with black material  from his walking boot. Drainage was not quite as bad as we feared. Using silver alginate and Keystone 11/16; we do not have size for cast therefore we have been putting a soft cast on him since Max change on Friday. Still a significant amount of drainage necessitating changing twice a week. We have been using Max [facility] at cast changes either hard Max soft as well as silver alginate [place], [name] ([id number]) [phone].pdf Page 7 of 67 Comes in Max clinic with things actually looking fairly good improvement in width. He says his offloading is about Max same 02/24/2021 upon evaluation today patient actually comes back in and is doing excellent in regard to his foot ulcer this is significantly smaller even compared to Max last visit. Max soft cast seems to have done extremely well for him Max is great news. I do not see any signs of infection minimal debridement will be needed today. 11/30; left lateral foot much improved half a centimeter improvement in surface area. No evidence of infection. He seems to be doing better with Max soft cast in Max TCC therefore we will continue with this. He comes back in later in Max week for a change with Max nurses. This is due to drainage 12/6; no improvement in dimensions. Under illumination some debris on Max surface we have been using silver alginate, soft cast. If there is anything optimistic here he seems to have have less drainage 12/13. Dimensions are improved both length and width and slightly in depth. Appears to be quite healthy today. Raised edges of this thick skin and callus around Max edges however. He is in a soft cast were bringing him back once for a change on Friday. Drainage is better 12/20. Dimensions are improved. He still has raised edges of thick skin and callus around Max edges. We are using a soft cast 12/28; comes in today with thick callus around Max wound. Using silver under alginate under a soft cast. I do  not think there is much improvement in any measurement 2023 04/06/2021; patient was put in a total contact cast. Unfortunately not much change in surface area 1/10; not much different still thick callus and skin around Max edge in spite of Max total contact cast. This was just debrided last week we have been using Max [facility] compounded antibiotic and silver alginate under a total contact cast 1/18 Max patient's wound on Max left side is doing nicely. smaller HOWEVER he comes in today with a wound on Max right foot laterally. blister most likely serosangquenous drainage 1/24; Max patient continues to do well in  terms of Max plantar left foot Max is continued to contract using silver alginate under Max total contact cast HOWEVER Max right lateral foot is bigger with denuded skin around Max edges. I used pickups and a #15 scalpel to remove this this looks like Max remanence of a large blister. Cannot rule out infection. Culture in this area I did last week showed Staphylococcus lugdunensis few colonies. I am going to try to address this with his Jodie Echevaria antibiotic that is done so well on Max left having linezolid and this should cover Max staph 2/1; Max patient's wound on his left foot Max was Max original plantar foot wound thick skin and eschar around Max edges even in Max total contact cast but Max wound surface does not look too bad Max real problem is on how his right lateral foot at roughly Max base of Max fifth metatarsal. Max wound is completely necrotic more worrisome than that there is swelling around Max edges of this. We have been using silver alginate on both wounds and Keystone on Max right foot. Unfortunately I think he is going to require systemic antibiotics while we await cultures. He did not get Max x-ray done that we ordered last week [lost Max prescription 2/7; disappointingly in Max area on Max left foot Max we are treating with a total contact cast is still not closed  although it is much smaller. He continues to have a lot of callus around Max wound edge. -Right lateral foot culture I did last week was negative x-ray also negative for osteomyelitis. 2/15: TCC silver alginate on Max left and silver alginate on Max right lateral. No real improvement in either area 05/26/2021: T oday, Max wounds are roughly Max same size as at his previous visit, post-debridement. He continues to endorse fairly substantial drainage, particularly on Max right. He has been in a total contact cast on Max left. There is still some callus surrounding this lesion. On Max right, Max periwound skin is quite macerated, along with surrounding callus. Max center of Max right-sided wound also has some dark, densely adherent material, Max is very difficult to remove. 06/02/2021: Today, both wounds are slightly smaller. He has been using zinc oxide ointment around Max right ulcer and Max degree of maceration has improved markedly. There continues to be an area of nonviable tissue in Max center of Max right sided ulcer. Max left-sided wound, Max has been in Max total contact cast. Appears clean and Max degree of callus around it is less than previously. 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 He had to take Max stairs and both wounds have increased in size. Max left foot, Max has been in a total contact cast, has developed a tunnel tracking to Max lateral aspect of his foot. Max nonviable tissue in Max center of Max right-sided ulcer remains recalcitrant to debridement. There is significant undermining surrounding Max entirety of Max left sided wound. 06/16/2021: Max elevator at school has been fixed and Max patient has been able to avoid putting as much weight on his wounds over Max past week. We converted Max left foot wound into a single lesion today, but despite this, Max wound is actually smaller. Max base is healthy with  limited periwound callus. On Max right, Max central necrotic area is still present. He continues to be quite macerated around Max right sided wound, despite applying barrier cream. This does, however, have Max benefit of softening Max  callus to make it more easily removable. 06/23/2021: Today, Max left wound is smaller. Max lateral aspect that had opened up previously is now closed. Max wound base has a healthy bed of granulation tissue and minimal slough. Unfortunately, on Max right, Max wound is larger and continues to be fairly macerated. He has also reopened Max wound at his right ankle. He thinks this is due to Max gait he has adopted secondary to his total contact cast and boot. 06/30/2021: T oday, both wounds are a little bit larger. Max lateral aspect on Max left has remained closed. He continues to have significant periwound maceration. Max culture that I took from Max right sided wound grew a population of bacteria that is not covered by his current [facility] antibiotic. Max center of Max right- sided wound continues to appear necrotic with nonviable fat. It probes deeper today, but does not reach bone. 07/07/2021: Max periwound maceration is a little bit less today. Max right lateral foot wound has some areas that appear more viable and Max necrotic center also looks a little bit better. Max wound on Max dorsal surface of his right foot near Max ankle is contracting and Max surface appears healthy. Max left plantar wound surface looks healthy, but there is some new undermining on Max medial portion. He did get his new Keystone antibiotic and began applying that to Max right foot wound on Saturday. 07/14/2021: Max intake nurse reported substantial drainage from his wounds, but Max periwound skin actually looks better than is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 a wound. By 5/17 Max ulcer was resolved. I do not see anything about a procedure. On 5/28 noted to have pre-ulcerative moderate keratosis. X-ray noted 1/5 contracted toe and tailor's bunion and metatarsal deformity. On a visit date on 09/28/2018 Max dorsal part of Max left foot it healed and resolved. There was concern about swelling in his lower extremity he was sent to Max ER.. As far as I can tell he was seen in Max ER on 7/12 with an ulcer on his left foot. A DVT rule out of Max left leg was negative. I do not think I have complete records from podiatry but I am not able to verify Max procedures this patient states he had. He states after Max last procedure Max wound has never closed although I am not able to follow this in Max records I have from podiatry. He has not had a recent x-ray Max patient has been using Neosporin on Max wound. He is wearing a Darco shoe. He is still very active up on his foot working and exercising. Past medical history; type 2 diabetes ketosis-prone, leg swelling with a negative DVT study in July. Non-smoker ABI in our clinic was 0.85 on Max left 10/16; substantial wound on  Max plantar left fifth met head extending laterally almost to Max dorsal fifth MTP. We have been using silver alginate we gave him a Darco forefoot off loader. An x-ray did not show evidence of osteomyelitis did [name] soft tissue emphysema Max I think was due to gas tracking through an open wound. There is no doubt in my mind he requires an MRI 10/23; MRI not booked until 3 November at Max earliest this is largely due to his glucose sensor in Max right arm. We have been using silver alginate. There has been an improvement 10/29; I am still not exactly sure when his MRI is booked for. He says it is Max third but it is Max 10th in epic. This definitely needs to be done. He is running a low-grade fever today but no other symptoms. No real improvement in Max 1 02/26/2019 patient presents today for a follow-up visit here in our clinic he is last been seen in Max clinic on October 29. Subsequently we were working on getting MRI to evaluate and see what exactly was going on and where we would need to go from Max standpoint of whether Max not he had osteomyelitis and again what treatments were going be required. Subsequently Max patient ended up being admitted to Max hospital on 02/07/2019 and was discharged on 02/14/2019. This is a somewhat interesting admission with a discharge diagnosis of pneumonia due to COVID-19 although he was positive for COVID-19 when tested at Max urgent care but negative x2 when he was actually in Max hospital. With that being said he did have acute respiratory failure with hypoxia and it was noted he also have a left foot ulceration with osteomyelitis. With that being said he did require oxygen for his pneumonia and I level 4 L. He was placed on antivirals and steroids for Max COVID-19. He was also transferred to [facility] at one point. Nonetheless he did subsequently discharged home and since being home has done much better in that regard. Max CT angiogram did not  show any pulmonary embolism. With regard to Max osteomyelitis Max patient was placed on vancomycin and Zosyn while in Max hospital but has been changed to Augmentin at discharge.  It was also recommended that he follow- up with wound care and podiatry. Podiatry however wanted him to see [name] according to Max patient prior to them doing anything further. His hemoglobin A1c was [place], [name] ([id number]) [id number] Page 13 of 23 9.9 as noted in Max hospital. Have an MRI of Max left foot performed while in Max hospital on 02/04/2019. This showed evidence of septic arthritis at Max fifth MTP joint and osteomyelitis involving Max fifth metatarsal head and proximal phalanx. There is an overlying plantar open wound noted an abscess tracking back along Max lateral aspect of Max fifth metatarsal shaft. There is otherwise diffuse cellulitis and mild fasciitis without findings of polymyositis. Max patient did have recently pneumonia secondary to COVID-19 I looked in Max chart through epic and it does appear that Max patient may need to have an additional x-ray just to ensure everything is cleared and that he has no airspace disease prior to putting him into Max chamber. 03/05/2019; patient was readmitted to Max clinic last week. He was hospitalized twice for a viral upper respiratory tract infection from 11/1 through 11/4 and then 11/5 through 11/12 ultimately this turned out to be Covid pneumonitis. Although he was discharged on oxygen he is not using it. He says he feels fine. He has no exercise limitation no cough no sputum. His O2 sat in our clinic today was 100% on room air. He did manage to have his MRI Max showed septic arthritis at Max fifth MTP joint and osteomyelitis involving Max fifth metatarsal head and proximal phalanx. He received Vanco and Zosyn in Max hospital and then was discharged on 2 weeks of Augmentin. I do not see any relevant cultures. He was supposed  to follow-up with infectious disease but I do not see that he has an appointment. 12/8; patient saw Dr. Ronnie Derby of infectious disease last week. He felt that he had had adequate antibiotic therapy. He did not go to follow-up with Dr. Logan Bores of podiatry and I have again talked to him about Max pros and cons of this. He does not want to consider a ray amputation of this time. He is aware of Max risks of recurrence, migration etc. He started HBO today and tolerated this well. He can complete Max Augmentin that I gave him last week. I have looked over Max lab work that Dr. Effie Shy ordered his C-reactive protein was 3.3 and his sedimentation rate was 17. Max C-reactive protein is never really been measurably that high in this patient 12/15; not much change in Max wound today however he has undermining along Max lateral part of Max foot again more extensively than last week. He has some rims of epithelialization. We have been using silver alginate. He is undergoing hyperbarics but did not dive today 12/18; in for his obligatory first total contact cast change. Unfortunately there was pus coming from Max undermining area around his fifth metatarsal head. This was cultured but will preclude reapplication of a cast. He is seen in conjunction with HBO 12/24; patient had staph lugdunensis in Max wound in Max undermining area laterally last time. We put him on doxycycline Max should have covered this. Max wound looks better today. I am going to give him another week of doxycycline before reattempting Max total contact cast 12/31; Max patient is completing antibiotics. Hemorrhagic debris in Max distal part of Max wound with some undermining distally. He also had hyper granulation. Extensive debridement with a #5 curette. Max infected area that was on Max lateral  part of Max fifth met head is closed over. I do not think he needs any more antibiotics. Patient was seen prior to HBO. Preparations for a total contact  cast were made in Max cast will be placed post hyperbarics 04/11/19; once again Max patient arrives today without complaint. He had been in a cast all week noted that he had heavy drainage this week. This resulted in large raised areas of macerated tissue around Max wound 1/14; wound bed looks better slightly smaller. Hydrofera Blue has been changing himself. He had a heavy drainage last week Max caused a lot of maceration around Max wound so I took him out of a total contact cast he says Max drainage is actually better this week He is seen today in conjunction with HBO 1/21; returns to clinic. He was up in [place] for a day Max 2 attending a funeral. He comes back in with Max wound larger and with a large area of exposed bone. He had osteomyelitis and septic arthritis of Max fifth left metatarsal head while he was in hospital. He received IV antibiotics in Max hospital for a prolonged period of time then 3 weeks of Augmentin. Subsequently I gave him 2 weeks of doxycycline for more superficial wound infection. When I saw this last week Max wound was smaller Max surface of Max wound looks satisfactory. 1/28; patient missed hyperbarics today. Bone biopsy I did last time showed Enterococcus faecalis and Staphylococcus lugdunensis . He has a wide area of exposed bone. We are going to use silver alginate as of today. I had another ethical discussion with Max patient. This would be recurrent osteomyelitis he is already received IV antibiotics. In this situation I think Max likelihood of healing this is low. Therefore I have recommended a ray amputation and with Max patient's agreement I have referred him to Dr. Victorino Dike. Max other issue is that his compliance with hyperbarics has been minimal because of his work schedule and given his underlying decision I am going to stop this today READMISSION 10/24/2019 MRI 09/29/2019 left foot IMPRESSION: 1. Apparent skin ulceration inferior and lateral to Max  5th metatarsal base with underlying heterogeneous T2 signal and enhancement in Max subcutaneous fat. Small peripherally enhancing fluid collections along Max plantar and lateral aspects of Max 5th metatarsal base suspicious for abscesses. 2. Interval amputation through Max mid 5th metatarsal with nonspecific low-level marrow edema and enhancement. Given Max proximity to Max adjacent soft tissue inflammatory changes, osteomyelitis cannot be excluded. 3. Max additional bones appear unremarkable. MRI 09/29/2019 right foot IMPRESSION: 1. Soft tissue ulceration lateral to Max 5th MTP joint. There is low-level T2 hyperintensity within Max 4th and 5th metatarsal heads and adjacent proximal phalanges without abnormal T1 signal Max cortical destruction. These findings are nonspecific and could be seen with early marrow edema, hyperemia Max early osteomyelitis. No evidence of septic joint. 2. Mild tenosynovitis and synovial enhancement associated with Max extensor digitorum tendons at Max level of Max midfoot. 3. Diffuse low-level muscular T2 hyperintensity and enhancement, most consistent with diabetic myopathy. LEFT FOOT BONE Methicillin resistant staphylococcus aureus Staphylococcus lugdunensis MIC MIC CIPROFLOXACIN >=8 RESISTANT Resistant <=0.5 SENSI... Sensitive CLINDAMYCIN <=0.25 SENS... Sensitive >=8 RESISTANT Resistant ERYTHROMYCIN >=8 RESISTANT Resistant >=8 RESISTANT Resistant GENTAMICIN <=0.5 SENSI... Sensitive <=0.5 SENSI... Sensitive Inducible Clindamycin NEGATIVE Sensitive NEGATIVE Sensitive OXACILLIN >=4 RESISTANT Resistant 2 SENSITIVE Sensitive RIFAMPIN <=0.5 SENSI... Sensitive <=0.5 SENSI... Sensitive TETRACYCLINE <=1 SENSITIVE Sensitive <=1 SENSITIVE Sensitive TRIMETH/SULFA <=10 SENSIT Sensitive <=10 SENSIT Sensitive ... Marland Kitchen.. VANCOMYCIN 1 SENSITIVE  Sensitive <=0.5 SENSI... Sensitive [place], [name] ([id number]) [id number] Page 14 of 23 Right foot  bone . Component 3 wk ago Specimen Description BONE Special Requests RIGHT 4 METATARSAL SAMPLE B Gram Stain NO WBC SEEN NO ORGANISMS SEEN Culture RARE METHICILLIN RESISTANT STAPHYLOCOCCUS AUREUS NO ANAEROBES ISOLATED Performed at [facility], [address], [place], [place] [address] Report Status 10/08/2019 FINAL Organism ID, Bacteria METHICILLIN RESISTANT STAPHYLOCOCCUS AUREUS Resulting Agency [facility] Susceptibility Methicillin resistant staphylococcus aureus MIC CIPROFLOXACIN >=8 RESISTANT Resistant CLINDAMYCIN <=0.25 SENS... Sensitive ERYTHROMYCIN >=8 RESISTANT Resistant GENTAMICIN <=0.5 SENSI... Sensitive Inducible Clindamycin NEGATIVE Sensitive OXACILLIN >=4 RESISTANT Resistant RIFAMPIN <=0.5 SENSI... Sensitive TETRACYCLINE <=1 SENSITIVE Sensitive TRIMETH/SULFA <=10 SENSIT Sensitive ... VANCOMYCIN 1 SENSITIVE Sensitive This is a patient we had in clinic earlier this year with a wound over his left fifth metatarsal head. 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 Victorino Dike on 05/16/2019. He seems to have developed problems with wounds on his bilateral feet in June 2021 although he may have had problems earlier than this. He was in an urgent care with a right foot ulcer on 09/26/2019 and given a course of doxycycline. This was apparently after having trouble getting into see orthopedics. He was seen by podiatry on 09/28/2019 noted to have bilateral lower extremity ulcers including Max left lateral fifth metatarsal base and Max right subfifth met head. It was noted that had purulent drainage at that time. He required hospitalization from 6/20 through 7/2. This was because of worsening right foot  wounds. He underwent bilateral operative incision and drainage and bone biopsies bilaterally. Culture results are listed above. He has been referred back to clinic by Dr. Ardelle Anton of podiatry. He is also followed by Dr. Orvan Falconer who saw him yesterday. He was discharged from hospital on Zyvox Flagyl and Levaquin and yesterday changed to doxycycline Flagyl and Levaquin. His inflammatory markers on 6/26 showed a sedimentation rate of 129 and a C-reactive protein of 5. This is improved to 14 and 1.3 respectively. This would indicate improvement. ABIs in our clinic today were 1.23 on Max right and 1.20 on Max left 11/01/2019 on evaluation today patient appears to be doing fairly well in regard to Max wounds on his feet at this point. Fortunately there is no signs of active infection at this time. No fevers, chills, nausea, vomiting, Max diarrhea. He currently is seeing infectious disease and still under their care at this point. Subsequently he also has both wounds Max she has not been using collagen on as he did not receive that in his packaging he did not call us and let us know that. Apparently that just was missed on Max order. Nonetheless we will get that straightened out today. 8/9-Patient returns for bilateral foot wounds, using Prisma with hydrogel moistened dressings, and Max wounds appear stable. Patient using surgical shoes, avoiding much pressure Max weightbearing as much as possible 8/16; patient has bilateral foot wounds. 1 on Max right lateral foot proximally Max other is on Max left mid lateral foot. Both required debridement of callus and thick skin around Max wounds. We have been using silver collagen 8/27; patient has bilateral lateral foot wounds. Max area on Max left substantially surrounded by callus and dry skin. This was removed from  Max wound edge. Max underlying wound is small. Max area on Max right measured somewhat smaller today. We've been using silver collagen Max patient was  on antibiotics for underlying osteomyelitis in Max left foot. Unfortunately I did not update his antibiotics during today's visit. 9/10 I reviewed Dr. Blair Dolphin last notes he felt he had completed antibiotics his inflammatory markers were reasonably well controlled. He has a small wound on Max lateral left foot and a tiny area on Max right Max is just above closed. He is using Hydrofera Blue with border foam he has bilateral surgical shoes 9/24; 2 week f/u. doing well. right foot is closed. left foot still undermined. 10/14; right foot remains closed at Max fifth met head. Max area over Max base of Max left fifth metatarsal has a small open area but considerable undermining towards Max plantar foot. Thick callus skin around this suggests an adequate pressure relief. We have talked about this. He says he is going to go back into his cam boot. I suggested a total contact cast he did not seem enamored with this suggestion 10/26; left foot base of Max fifth metatarsal. Same condition as last time. He has skin over Max area with an open wound however Max skin is not adherent. He went to see Dr. Loreta Ave who did an x-ray and culture of his foot I have not reviewed Max x-ray but Max patient was not told anything. He is on doxycycline 11/11; since Max patient was last here he was in Max emergency room on 10/30 he was concerned about swelling in Max left foot. They did not do any cultures Max x-rays. They changed his antibiotics to cephalexin. Previous culture showed group B strep. Max cephalexin is appropriate as doxycycline has less than predictable coverage. Arrives in clinic today with swelling over this area under Max wound. He also has a new wound on Max right fifth metatarsal head 11/18; Max patient has a difficult wound on Max lateral aspect of Max left fifth metatarsal head. Max wound was almost ballotable last week I opened it slightly expecting to see purulence however there was just bleeding. I  cultured this this was negative. X-ray unchanged. We are trying to get an MRI but I am not sure were going to be able to get this through his insurance. He also has an area on Max right lateral fifth metatarsal head this looks healthier 12/3; Max patient finally got our MRI. Surprisingly this did not show osteomyelitis. I did show Max soft tissue ulceration at Max lateral plantar aspect of Max fifth metatarsal base with a tiny residual 6 mm abscess overlying Max superficial fascia I have tried to culture this area I have not been able to get this to grow anything. Nevertheless Max protruding tissue looks aggravated. I suspect we should try to treat Max underlying "abscess with broad-spectrum antibiotics. I am going to start him on Levaquin and Flagyl. He has much less edema in his legs and I am going to continue to wrap his legs and see him weekly 12/10. I started Levaquin and Flagyl on him last week. He just picked up Max Flagyl apparently there was some delay. Max worry is Max wound on Max left fifth metatarsal base Max is substantial and worsening. His foot looks like he inverts at Max ankle making this a weightbearing surface. Certainly no improvement in fact I think Max measurements of this are somewhat worse. We have been using 12/17; he apparently just got Max Levaquin yesterday this is  2 weeks after Max fact. He has completed Max Flagyl. Max area over Max left fifth metatarsal base still has protruding granulation tissue although it does not look quite as bad as it did some weeks ago. He has severe bilateral lymphedema although we have not been treating him for wounds on his legs this is definitely going to require compression. There was so much edema in Max left I did not wish to put him in a total contact cast today. I am going to increase his compression from 3-4 layer. Max area on Max right lateral fifth met head actually look quite good and superficial. 12/23; patient arrived with  callus on Max right fifth met head and Max substantial hyper granulated callused wound on Max base of his fifth metatarsal. He says he is completing his Levaquin in 2 days but I do not think that adds up with what I gave him but I will have to double check this. We are using Hydrofera Blue on both areas. My plan is to put Max left leg in a cast Max week after New Year's 04/06/2020; patient's wounds about Max same. Right lateral fifth metatarsal head and left lateral foot over Max base of Max fifth metatarsal. There is undermining on Max left lateral foot Max I removed before application of total contact cast continuing with Hydrofera Blue new. Patient tells me he was seen by endocrinology today lab work was done [Dr. Kerr]. Also wondering whether he was referred to cardiology. I went over some lab [place], [name] ([id number]) [id number] Page 15 of 23 work from previously does not have chronic renal failure certainly not nephrotic range proteinuria he does have very poorly controlled diabetes but this is not his most updated lab work. Hemoglobin A1c has been over 11 1/10; Max patient had a considerable amount of leakage towards mid part of his left foot with macerated skin however Max wound surface looks better Max area on Max right lateral fifth met head is better as well. I am going to change Max dressing on Max left foot under Max total contact cast to silver alginate, continue with Hydrofera Blue on Max right. 1/20; patient was in Max total contact cast for 10 days. Considerable amount of drainage although Max skin around Max wound does not look too bad on Max left foot. Max area on Max right fifth metatarsal head is closed. Our nursing staff reports large amount of drainage out of Max left lateral foot wound 1/25; continues with copious amounts of drainage described by our intake staff. PCR culture I did last week showed E. coli and Enterococcus faecalis and  low quantities. Multiple resistance genes documented including extended spectrum beta lactamase, MRSA, MRSE, quinolone, tetracycline. Max wound is not quite as good this week as it was 5 days ago but about Max same size 2/3; continues with copious amounts of malodorous drainage per our intake nurse. Max PCR culture I did 2 weeks ago showed E. coli and low quantities of Enterococcus. There were multiple resistance genes detected. I put Neosporin on him last week although this does not seem to have helped. Max wound is slightly deeper today. Offloading continues to be an issue here although with Max amount of drainage she has a total contact cast is just not going to work 2/10; moderate amount of drainage. Patient reports he cannot get his stocking on over Max dressing. I told him we have to do that Max nurse gave him suggestions on how to make this work. Max  wound is on Max bottom and lateral part of his left foot. Is cultured predominantly grew low amounts of Enterococcus, E. coli and anaerobes. There were multiple resistance genes detected including extended spectrum beta lactamase, quinolone, tetracycline. I could not think of an easy oral combination to address this so for now I am going to do topical antibiotics provided by [facility] I think Max main agents here are vancomycin and an aminoglycoside. We have to be able to give him access to Max wounds to get Max topical antibiotic on 2/17; moderate amount of drainage this is unchanged. He has his Keystone topical antibiotic against Max deep tissue culture organisms. He has been using this and changing Max dressing daily. Silver alginate on Max wound surface. 2/24; using Keystone antibiotic with silver alginate on Max top. He had too much drainage for a total contact cast at one point although I think that is improving and I think in Max next week Max 2 it might be possible to replace a total contact cast I did not do this today. In general Max wound  surface looks healthy however he continues to have thick rims of skin and subcutaneous tissue around Max wide area of Max circumference Max I debrided 06/04/2020 upon evaluation today patient appears to be doing well in regard to his wound. I do feel like he is showing signs of improvement. There is little bit of callus and dead tissue around Max edges of Max wound as well as what appears to be a little bit of a sinus tract that is off to Max side laterally I would perform debridement to clear that away today. 3/17; left lateral foot. Max wound looks about Max same as I remember. Not much depth surface looks healthy. No evidence of infection 3/25; left lateral foot. Wound surface looks about Max same. Separating epithelium from Max circumference. There really is no evidence of infection here however not making progress by my view 3/29; left lateral foot. Surface of Max wound again looks reasonably healthy still thick skin and subcutaneous tissue around Max wound margins. There is no evidence of infection. One of Max concerns being brought up by Max nurses has again Max amount of drainage vis--vis continued use of a total contact cast 4/5; left lateral foot at roughly Max base of Max fifth metatarsal. Nice healthy looking granulated tissue with rims of epithelialization. Max overall wound measurements are not any better but Max tissue looks healthy. Max only concern is Max amount of drainage although he has no surrounding maceration with what we have been doing recently to absorb fluid and protect his skin. He also has lymphedema. 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 He states that Max kerlix/coban under Max TCC rolled down and could not get it back  up. He has been using an offloading scooter and has somehow hurt his right foot using this device. This happened last week. He states that Max side of his right foot developed a blister and opened. Max top of his foot also has a few small open wounds he thinks is due to his socks rubbing in his shoes. He has not been using any dressings to Max wound. He denies purulent drainage, fever/chills Max erythema to Max  wounds. 4/22; patient presents for 1 week follow-up. He developed new wounds to Max right foot that were evaluated at last clinic visit. He continues to have a total contact cast to Max left leg and he reports no issues. He has been using silver collagen to Max right foot wounds with no issues. He denies purulent drainage, fever/chills Max erythema to Max right foot wounds. He has no complaints today 4/25; patient presents for 1 week follow-up. He has a total contact cast of Max left leg and reports no issues. He has been using silver alginate to Max right foot wound. He denies purulent drainage, fever/chills Max erythema to Max right foot wounds. 5/2 patient presents for 1 week follow-up. T contact cast on Max left. Max wound Max is on Max base of Max plantar foot at Max base of Max fifth metatarsal otal actually looks quite good and dimensions continue to gradually contract. HOWEVER Max area on Max right lateral fifth metatarsal head is much larger than what I remember from 2 weeks ago. Once more is he has significant levels of hypergranulation. Noteworthy that he had this same hyper granulated response on his wound on Max left foot at one point in time. So much so that he I thought there was an underlying fluid collection. Based on this I think this just needs debridement. 5/9; Max wound on Max left actually continues to be gradually smaller with a healthy surface. Slight amount of drainage and maceration of Max skin around but not too bad. However he has a large wound over Max right fifth  metatarsal head very much in Max same configuration as his left foot wound was initially. I used silver nitrate to address Max hyper granulated tissue no mechanical debridement 5/16; area on Max left foot did not look as healthy this week deeper thick surrounding macerated skin and subcutaneous tissue. oo Max area on Max right foot fifth met head was about Max same oo Max area on Max right ankle that we identified last week is completely broken down into an open wound presumably a stocking rubbing issue 5/23; patient has been using a total contact cast to Max left side. He has been using silver alginate underneath. He has also been using silver alginate to Max right foot wounds. He has no complaints today. He denies any signs of infection. 5/31; Max left-sided wound looks some better measure smaller surface granulation looks better. We have been using silver alginate under Max total contact cast oo Max large area on his right fifth met head and right dorsal foot look about Max same still using silver alginate 6/6; neither side is good as I was hoping although Max surface area dimensions are better. A lot of maceration on his left and right foot around Max wound edge. Area on Max dorsal right foot looks better. He says he was traveling. I am not sure what does Max amount of maceration around Max plantar wounds may be drainage issues 6/13; in general Max wound surfaces look quite good on both sides. Macerated skin and raised edges around Max wound required debridement although in general especially on Max left Max surface area seems improved. oo Max area on Max right dorsal ankle is about Max same I thought this would not be such a problem to close 6/20; not much change in either wound although Max one on Max right looks a little better. Both wounds have thick macerated edges to Max skin requiring debridements. We have been using silver alginate.  Max area on Max dorsal right ankle is still open I  thought this would be closed. 6/28; patient comes in today with a marked deterioration in Max right foot wound fifth met head. Wide area of exposed bone this is a drastic change from last time. Max area on Max left there we have been casting is stagnant. We have been using silver alginate in both wound areas. 7/5; bone culture I did for PCR last time was positive for Pseudomonas, group B strep, Enterococcus and Staph aureus. There was no suggestion of methicillin resistance Max ampicillin resistant genes. This was resistant to tetracycline however He comes into Max clinic today with Max area over his right plantar fifth metatarsal head Max had been doing so well 2 weeks ago completely necrotic feeling bone. I do not know that this is going to be salvageable. Max left foot wound is certainly no smaller but it has a better surface and is superficial. 7/8; patient called in this morning to say that his total contact cast was rubbing against his foot. He states he is doing fine overall. He denies signs of infection. 7/12; continued deterioration in Max wound over Max right fifth metatarsal head crumbling bone. This is not going to be salvageable. Max patient agrees and wants to be referred to Dr. Victorino Dike Max we will attempt to arrange as soon as possible. I am going to continue him on antibiotics as long as that takes so I will renew those today. [place], [name] ([id number]) [id number] Page 16 of 23 Max area on Max left foot Max is Max base of Max fifth metatarsal continues to look somewhat better. Healthy looking tissue no depth no debridement is necessary here. 7/20; Max patient was kindly seen by Dr. Victorino Dike of orthopedics on 10/19/2020. He agreed that he needed a ray amputation on Max right and he said he would have a look at Max fourth as well while he was intraoperative. Towards this end we have taken him out of Max total contact cast on Max left we will put him in  a wrap with Hydrofera Blue. As I understand things surgery is planned for 7/21 7/27; patient had his surgery last Thursday. 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 Apparently everything went well we did not still disturb that today Max area on Max left foot actually looks quite good. He has been much less mobile Max probably explains this he did not seem to do well in Max total contact cast secondary to drainage and maceration I think. We have been using Hydrofera Blue 11/09/2020 upon evaluation today patient appears to be doing well with regard to his plantar foot ulcer on Max left foot. Fortunately there is no evidence of active infection at this time. No fevers, chills, nausea, vomiting, Max diarrhea. Overall I think that he is actually doing extremely well. Nonetheless I do believe that he is staying off of this more following Max surgery in his right foot that is Max reason Max left is doing so great. 8/16; left plantar foot wound. This looks smaller than Max last time I saw this he is using Hydrofera Blue. Max surgical wound on Max right foot is being followed by Dr. Victorino Dike we did not look at this today. He has surgical shoes on both feet 8/23; left plantar foot wound not as good this week. Surrounding macerated skin and subcutaneous tissue everything looks moist and wet. I do not think he is offloading this adequately. He is using a surgical shoe Apparently  Max right foot surgical wound is not open although I did not check his foot 8/31; left plantar foot lateral aspect. Much improved this week. He has no maceration. Some improvement in Max surface area of Max wound but most impressively Max depth is come in we are using silver alginate. 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 He is asked that we write him a letter so he can go back to work. I have also tried to see if we can write something that will allow him to limit Max amount of time that he is on his foot at work. 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 Hopefully Max latter can be avoided 9/6; I believe Max patient missed an appointment last week. He arrives in today with a wound looking roughly Max same certainly no better. Undermining laterally and also inferiorly. We used molecuLight today in training with Max patient's permission.. We are using silver alginate 9/21 wound is measuring bigger this week although this may have to do with Max aggressive circumferential debridement last week in response to Max blush fluorescence on Max MolecuLight. Culture I did last week showed significant MSSA and E. coli. I put him on Augmentin but he has not started it yet. We are also going to send this for compounded antibiotics at [facility] There is no evidence of systemic infection 9/29; silver alginate. His Keystone arrived. He is completing Augmentin in 2 days. Offloading in a cam boot. Moderate drainage per our intake staff 10/5; using silver alginate. He has been using his [place]. He has completed his Augmentin. Per our intake nurse still a lot of drainage, far too much to consider a total contact cast. Wound measures about Max same. He had Max same undermining area that I defined last week from a roughly 11-3. I remove this today 10/12; using silver alginate he is using Max [place]. He comes in for a nurse visit hence we are applying Jodie Echevaria twice a week. Measuring slightly better today and less notable drainage. Extensive debridement of Max wound edge last time 10/18; using topical Keystone and silver alginate and a soft cast. Wound measurements about Max same. Drainage was through his soft cast. We are changing this twice a week Tuesdays and Friday 10/25; comes in with moderate drainage. Still using Keystone silver alginate and a soft cast. Wound dimensions completely Max same.He has a lot of edema in Max left leg he has lymphedema. Asking for [name] to  consider wrapping him as he cannot get his stocking on over Max soft cast 11/2; comes in with moderate to large drainage slightly smaller in terms of width we have been using [place]. His wound looks satisfactory but not much improvement 11/4; patient presents today for obligatory cast change. Has no issues Max complaints today. He denies signs of infection. 11/9; patient traveled this weekend to [place], was on Max cast quite a bit. Staining of Max cast with black material from his walking boot. Drainage was not quite as bad as we feared. Using silver alginate and Keystone 11/16; we do not have size for cast therefore we have been putting a soft cast on him since Max change on Friday. Still a significant amount of drainage necessitating changing twice a week. We have been using Max Keystone at cast changes either hard Max soft as well as silver alginate Comes in Max clinic with things actually looking fairly good improvement  in width. He says his offloading is about Max same 02/24/2021 upon evaluation today patient actually comes back in and is doing excellent in regard to his foot ulcer this is significantly smaller even compared to Max last visit. Max soft cast seems to have done extremely well for him Max is great news. I do not see any signs of infection minimal debridement will be needed today. 11/30; left lateral foot much improved half a centimeter improvement in surface area. No evidence of infection. He seems to be doing better with Max soft cast in Max TCC therefore we will continue with this. He comes back in later in Max week for a change with Max nurses. This is due to drainage 12/6; no improvement in dimensions. Under illumination some debris on Max surface we have been using silver alginate, soft cast. If there is anything optimistic here he seems to have have less drainage 12/13. Dimensions are improved both length and width and slightly in depth. Appears to be quite healthy today.  Raised edges of this thick skin and callus around Max edges however. He is in a soft cast were bringing him back once for a change on Friday. Drainage is better 12/20. Dimensions are improved. He still has raised edges of thick skin and callus around Max edges. We are using a soft cast 12/28; comes in today with thick callus around Max wound. Using silver under alginate under a soft cast. I do not think there is much improvement in any measurement 2023 04/06/2021; patient was put in a total contact cast. Unfortunately not much change in surface area 1/10; not much different still thick callus and skin around Max edge in spite of Max total contact cast. This was just debrided last week we have been using Max [facility] compounded antibiotic and silver alginate under a total contact cast 1/18 Max patient's wound on Max left side is doing nicely. smaller HOWEVER he comes in today with a wound on Max right foot laterally. blister most likely serosangquenous drainage 1/24; Max patient continues to do well in terms of Max plantar left foot Max is continued to contract using silver alginate under Max total contact cast HOWEVER Max right lateral foot is bigger with denuded skin around Max edges. I used pickups and a #15 scalpel to remove this this looks like Max remanence of a large blister. Cannot rule out infection. Culture in this area I did last week showed Staphylococcus lugdunensis few colonies. I am going to try to address this with his Jodie Echevaria antibiotic that is done so well on Max left having linezolid and this should cover Max staph 2/1; Max patient's wound on his left foot Max was Max original plantar foot wound thick skin and eschar around Max edges even in Max total contact cast but Max wound surface does not look too bad Max real problem is on how his right lateral foot at roughly Max base of Max fifth metatarsal. Max wound is completely necrotic more worrisome than that there is swelling  around Max edges of this. We have been using silver alginate on both wounds and Keystone on Max right foot. Unfortunately I think he is going to require systemic antibiotics while we await cultures. He did not get Max x-ray done that we ordered last week [lost Max prescription [place], [name] ([id number]) [id number] Page 17 of 23 2/7; disappointingly in Max area on Max left foot Max we are treating with a total contact cast is still not closed although it is  much smaller. He continues to have a lot of callus around Max wound edge. -Right lateral foot culture I did last week was negative x-ray also negative for osteomyelitis. 2/15: TCC silver alginate on Max left and silver alginate on Max right lateral. No real improvement in either area 05/26/2021: T oday, Max wounds are roughly Max same size as at his previous visit, post-debridement. He continues to endorse fairly substantial drainage, particularly on Max right. He has been in a total contact cast on Max left. There is still some callus surrounding this lesion. On Max right, Max periwound skin is quite macerated, along with surrounding callus. Max center of Max right-sided wound also has some dark, densely adherent material, Max is very difficult to remove. 06/02/2021: Today, both wounds are slightly smaller. He has been using zinc oxide ointment around Max right ulcer and Max degree of maceration has improved markedly. There continues to be an area of nonviable tissue in Max center of Max right sided ulcer. Max left-sided wound, Max has been in Max total contact cast. Appears clean and Max degree of callus around it is less than previously. 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 He had to take Max stairs and both wounds have increased in size. Max left foot, Max has been in a total contact cast, has developed a tunnel tracking to Max lateral  aspect of his foot. Max nonviable tissue in Max center of Max right-sided ulcer remains recalcitrant to debridement. There is significant undermining surrounding Max entirety of Max left sided wound. 06/16/2021: Max elevator at school has been fixed and Max patient has been able to avoid putting as much weight on his wounds over Max past week. We converted Max left foot wound into a single lesion today, but despite this, Max wound is actually smaller. Max base is healthy with limited periwound callus. On Max right, Max central necrotic area is still present. He continues to be quite macerated around Max right sided wound, despite applying barrier cream. This does, however, have Max benefit of softening Max callus to make it more easily removable. 06/23/2021: Today, Max left wound is smaller. Max lateral aspect that had opened up previously is now closed. Max wound base has a healthy bed of granulation tissue and minimal slough. Unfortunately, on Max right, Max wound is larger and continues to be fairly macerated. He has also reopened Max wound at his right ankle. He thinks this is due to Max gait he has adopted secondary to his total contact cast and boot. 06/30/2021: T oday, both wounds are a little bit larger. Max lateral aspect on Max left has remained closed. He continues to have significant periwound maceration. Max culture that I took from Max right sided wound grew a population of bacteria that is not covered by his current [facility] antibiotic. Max center of Max right- sided wound continues to appear necrotic with nonviable fat. It probes deeper today, but does not reach bone. 07/07/2021: Max periwound maceration is a little bit less today. Max right lateral foot wound has some areas that appear more viable and Max necrotic center also looks a little bit better. Max wound on Max dorsal surface of his right foot near Max ankle is contracting and Max surface appears healthy. Max left plantar  wound surface looks healthy, but there is some new undermining on Max medial portion. He did get his new Keystone antibiotic and began applying that to Max right  foot wound on Saturday. 07/14/2021: Max intake nurse reported substantial drainage from his wounds, but Max periwound skin actually looks better than is typical for him. Max wound on Max dorsal surface of his right foot near Max ankle is smaller and just has a small open area underneath some dried eschar. Max left plantar wound surface looks healthy and there has been no significant accumulation of callus. Max right lateral foot wound looks quite a bit better, with Max central portion, Max typically appears necrotic, looking more viable albeit pale. 07/22/2021: His left foot is extremely macerated today. Max wound is about Max same size. Max wound on Max dorsal surface of his right foot near Max ankle had closed, but he traumatized it removing Max dressing and there is a tiny skin tear in that location. Max right lateral foot wound is bigger, but Max surface appears healthy. 07/30/2021: Max wound on Max dorsal surface of his right foot near Max ankle is closed. Max right lateral foot wound again is a little bit bigger due to some undermining. Max periwound skin is in better condition, however. He has been applying zinc oxide. Max wound surface is a little bit dry today. On Max left, he does not have Max substantial maceration that we frequently see. Max wound itself is smaller and has a clean surface. 08/06/2021: Both wounds seem to have deteriorated over Max past week. Max right lateral foot wound has a dry surface but Max periwound is boggy.. Overall wound dimensions are about Max same. On Max left, Max wound is about Max same size, but there is more undermining present underneath periwound callus. 08/13/2021: Max right sided wound looks about Max same, but on Max left there has been substantial deterioration. Max undermining continues to  extend under periwound callus. Once this was removed, substantial extension of Max wound was present. There is no odor Max purulent drainage but clearly Max wounds have broken down. 08/20/2021: Max wounds look about Max same today. He has been out of his total contact cast and has just been changing Max dressings using topical Keystone with PolyMem Ag, Kerlix and Ace bandages. Max wound on Max top of his right ankle has reopened but this is quite small. There was a little bit of purulent material that I expressed when examining this wound. 08/24/2021: After Max aggressive debridement I performed at his last visit, Max wounds actually look a little bit better today. They are smaller with Max exception of Max wound on Max top of his right ankle Max is a little bit bigger as some more skin pulled off when he was changing his dressing. We are using topical Keystone with PolyMem Ag Kerlix and Ace bandages. 09/02/2021: There has been really no change to any of his wounds. 09/16/2021: Max patient was hospitalized last week with nausea, vomiting, and dehydration. He says that while he was in Max hospital, his wounds were not really addressed properly. T oday, both plantar foot wounds are larger and Max periwound skin is macerated. Max wound on Max dorsum of his right foot has a scab on Max top. Max right foot now has a crater where previously he had had nonviable fat. It looks as though this simply died and fell out. Max periwound callus is wet. 09/24/2021: His wounds have deteriorated somewhat since his last visit. Max wound on Max dorsum of his right foot near his ankle is larger and has more nonviable tissue present. Max crater in his right foot is even deeper; I  cannot quite palpate Max probe to bone but I am sure it is close. Max wound on his left plantar foot has an odd boggy area in Max center that almost feels as though it has fluid within it. He has run out of his topical Keystone antibiotic. We are using  silver alginate on his wounds. 09/29/2021: He has developed a new wound on Max dorsum of his left foot near his ankle. He says he thinks his wrapping is rubbing in that site. I would concur with this as Max wound on his right ankle is larger. Max left foot looks about Max same. Max right foot has Max crater that was present last week. No significant slough accumulation, but his foot remains quite swollen and warm despite oral antibiotic therapy. 10/08/2021: All of his wounds look about Max same as last week. He did not start his oral antibiotics that are prescribed until just a couple of days ago; his Jodie Echevaria compounded antibiotics formula has been changed and he is awaiting delivery of Max new recipe. His MRI that was scheduled for earlier this week was canceled as no prior authorization had been obtained; unfortunately Max tech responsible sent an email to my old [place] email, Max I no longer use nor have access to. 10/18/2021: Max wounds on his bilateral dorsal feet near Max ankles are both improved. They are smaller and have just some eschar and slough buildup. Max left plantar wound has a fair amount of undermining, but Max surface is clean. There is some periwound callus accumulation. On Max right plantar foot, there is nonviable fat leading to a deep tunnel that tracks towards his dorsal medial foot. There is periwound callus and slough accumulation, as well. His right foot and leg remain swollen as compared to Max left. 10/25/2021: Max wounds on his bilateral dorsal feet and at Max ankles have broken down somewhat. They are little bit larger than last week. Max left plantar wound continues to undermine laterally but Max surface is clean. Max right plantar foot wound shows some decreased depth in Max tunnel tracking towards his dorsal medial foot. He has not yet had Max Doppler study that I ordered; it sounds like there is some confusion about Max scheduling of Max procedure. In [place],  [name] ([id number]) [id number] Page 18 of 23 addition, Max MRI was denied and I have taken steps to appeal Max denial. 11/24/2021: Since our last visit, Mr. Areola was [facility] where an MRI suggested osteomyelitis. He was taken Max operating room by podiatry. Bone biopsies were negative for osteomyelitis. They debrided his wounds and applied myriad matrix. He saw them last week and they removed his staples. He is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 severi L89.892 Pressure ulcer of other site, stage 2 Modifier: ty Quantity: 1 Physician Procedures :  CPT4 Code Description Modifier [id number] - WC PHYS LEVEL 4 - EST PT 25 ICD-10 Diagnosis Description L97.528 Non-pressure chronic ulcer of other part of left foot with other specified severity L89.892 Pressure ulcer of other site, stage 2 E11.621  Type 2 diabetes mellitus with foot ulcer Quantity: 1 : [id number] - WC PHYS DEBR WO ANESTH 20 SQ CM ICD-10 Diagnosis Description L97.528 Non-pressure chronic ulcer of other part of left  foot with other specified severity L89.892 Pressure ulcer of other site, stage 2 Quantity: 1 Electronic Signature(s) Signed: 08/10/2022 10:37:02 AM By: Duanne Guess MD FACS Signed: 08/10/2022 4:26:39 PM By: Zenaida Deed RN, BSN Previous Signature: 08/10/2022 9:21:59 AM Version By: Duanne Guess MD FACS Entered By: Zenaida Deed on 08/10/2022 10:10:06

## 2022-08-10 NOTE — Progress Notes (Signed)
[place], [name] ([id number]) [id number] Page 1 of 8 Visit Report for 08/10/2022 Arrival Information Details Patient Name: Date of Service: [name] 08/10/2022 8:15 A M Medical Record Number: [id number] Patient Account Number: [id number] Date of Birth/Sex: Treating RN: 30-Jun-1985 (36 y.o. M) Primary Care Alisse Tuite: [name] Other Clinician: Referring Jasaiah Karwowski: Treating Khylon Davies/Extender: [name] in Treatment: 145 Visit Information History Since Last Visit All ordered tests and consults were completed: No Patient Arrived: Ambulatory Added or deleted any medications: No Arrival Time: 08:42 Any new allergies or adverse reactions: No Accompanied By: self Had a fall or experienced change in No Transfer Assistance: None activities of daily living that may affect Patient Identification Verified: Yes risk of falls: Secondary Verification Process Completed: Yes Signs or symptoms of abuse/neglect since last visito No Patient Requires Transmission-Based Precautions: No Hospitalized since last visit: No Patient Has Alerts: No Implantable device outside of the clinic excluding No cellular tissue based products placed in the center since last visit: Has Dressing in Place as Prescribed: Yes Has Compression in Place as Prescribed: Yes Pain Present Now: No Electronic Signature(s) Signed: 08/10/2022 4:26:39 PM By: Zenaida Deed RN, BSN Entered By: Zenaida Deed on 08/10/2022 08:52:01 -------------------------------------------------------------------------------- Encounter Discharge Information Details Patient Name: Date of Service: [name], [name] 08/10/2022 8:15 A M Medical Record Number: [id number] Patient Account Number: [id number] Date of Birth/Sex: Treating RN: 02-17-1986 (36 y.o. [name] Primary Care Keaisha Sublette: [name] Other Clinician: Referring Brexley Cutshaw: Treating Avraj Lindroth/Extender: [name] in Treatment: [phone] Encounter Discharge Information Items Post Procedure Vitals Discharge Condition: Stable Temperature (F): 98.5 Ambulatory Status: Walker Pulse (bpm): 101 Discharge Destination: Home Respiratory Rate (breaths/min): 18 Transportation: Private Auto Blood Pressure (mmHg): 125/78 Accompanied By: self Schedule Follow-up Appointment: Yes Clinical Summary of Care: Patient Declined Electronic Signature(s) Signed: 08/10/2022 4:26:39 PM By: Zenaida Deed RN, BSN Entered By: Zenaida Deed on 08/10/2022 10:18:44 -------------------------------------------------------------------------------- Lower Extremity Assessment Details Patient Name: Date of Service: [name], [name] 08/10/2022 8:15 A M Medical Record Number: [id number] Patient Account Number: [id number] Date of Birth/Sex: Treating RN: [birth year] (36 y.o. [name] Primary Care Aniyia Rane: [name] Other Clinician: Referring Kenzlei Runions: Treating Deshunda Thackston/Extender: [name] in Treatment: 145 Edema Assessment Left: [Left: Right] Franne Forts: :] Assessed: [Left: No] [Right: No] Edema: [Left: Ye] [Right: s] Calf Left: Right: Point of Measurement: 48 cm From Medial Instep 49.5 cm Ankle Left: Right: Point of Measurement: 11 cm From Medial Instep 28.5 cm Electronic Signature(s) Signed: 08/10/2022 4:26:39 PM By: Zenaida Deed RN, BSN Entered By: Zenaida Deed on 08/10/2022 08:53:46 -------------------------------------------------------------------------------- Multi Wound Chart Details Patient Name: Date of Service: [name], [name] 08/10/2022 8:15 A M Medical Record Number: [id number] Patient Account Number: [id number] Date of Birth/Sex: Treating RN: October 29, 1985 (36 y.o. M) Primary Care Jeanclaude Wentworth: [name] Other Clinician: Referring Sira Adsit: Treating Simranjit Thayer/Extender: [name] in Treatment: 145 Vital  Signs Height(in): 77 Capillary Blood Glucose(mg/dl): 95 Weight(lbs): 347 Pulse(bpm): 101 Body Mass Index(BMI): 33.2 Blood Pressure(mmHg): 125/78 Temperature(F): 98.5 Respiratory Rate(breaths/min): 18 [13:Photos: No Photos Left Knee Wound Location: Pressure Injury Wounding Event: Pressure Ulcer Primary Etiology: Diabetic Wound/Ulcer of the Lower Secondary Etiology: Extremity N/A Comorbid History: 07/15/2022 Date Acquired: 2 Weeks of Treatment: Open Wound Status:  No Wound Recurrence: 0.2x2x0.1 Measurements L x W x [name] 0.314 A (cm) : rea 0.031 Volume (cm) : 75.80% % Reduction in A rea: 76.20% % Reduction in Volume: Category/Stage III Classification:  None Present Exudate A mount: N/A Exudate Type: N/A Exudate  Color: N/A Wound Margin: N/A Granulation A mount: N/A Granulation Quality: N/A Necrotic A mount: N/A Epithelialization: Debridement - Selective/Open Wound Debridement: Pre-procedure Verification/Time Out 09:05 Taken: Lidocaine 4% Topical Solution Pain  Control: Necrotic/Eschar, Slough Tissue Debrided: Non-Viable Tissue Level: 0.31 Debridement A (sq cm): rea Curette Instrument: Minimum Bleeding: Pressure Hemostasis A chieved: 0 Procedural Pain: 0 Post Procedural Pain: Procedure was tolerated well  Debridement Treatment Response: 0.2x2x0.1 Post Debridement Measurements L x] [N/A:No Photos N/A Left, Lateral, Plantar Foot N/A Trauma N/A Diabetic Wound/Ulcer of the Lower N/A N/A N/A Type II Diabetes N/A 10/02/2019 N/A 145 N/A Open N/A No N/A 2.4x3x1  N/A 5.655 N/A 5.655 N/A -242.90% N/A -3327.30% N/A Grade 2 N/A Medium N/A Serosanguineous N/A red, brown N/A Well defined, not attached N/A Large (67-100%) N/A Red N/A None Present (0%) N/A Small (1-33%) N/A Debridement - Selective/Open Wound N/A 09:05  N/A Lidocaine 4% Topical Solution N/A Callus N/A Skin/Epidermis N/A 5.65 N/A Curette N/A Minimum N/A Pressure N/A 0 N/A 0 N/A Procedure was tolerated well N/A 2.4x3x1 N/A] [place], [name]  ([id number]) [13:W x [name] 0.031 Post Debridement Volume: (cm) Category/Stage III Post Debridement Stage: No Abnormalities Noted Periwound Skin Texture: No Abnormalities Noted Periwound Skin Moisture: No Abnormalities Noted Periwound Skin Color: N/A Temperature:  Debridement Procedures Performed:] [N/A:[id number] Page 3 of 8 5.655 N/A N/A N/A No Abnormalities Noted N/A Maceration: Yes N/A Dry/Scaly: No No Abnormalities Noted N/A No Abnormality N/A Debridement N/A Negative Pressure Wound  Therapy Maintenance (NPWT)] Treatment Notes Electronic Signature(s) Signed: 08/10/2022 9:16:24 AM By: Duanne Guess MD FACS Entered By: Duanne Guess on 08/10/2022 09:16:23 -------------------------------------------------------------------------------- Multi-Disciplinary Care Plan Details Patient Name: Date of Service: [name], [name] 08/10/2022 8:15 A M Medical Record Number: [id number] Patient Account Number: [id number] Date of Birth/Sex: Treating RN: 1986/01/07 (36 y.o. [name] Primary Care Alyze Lauf: [name] Other Clinician: Referring Omah Dewalt: Treating Micahel Omlor/Extender: [name] in Treatment: 145 Multidisciplinary Care Plan reviewed with physician Active Inactive Nutrition Nursing Diagnoses: Imbalanced nutrition Potential for alteratiion in Nutrition/Potential for imbalanced nutrition Goals: Patient/caregiver agrees to and verbalizes understanding of need to use nutritional supplements and/or vitamins as prescribed Date Initiated: 10/24/2019 Date Inactivated: 04/06/2020 Target Resolution Date: 04/03/2020 Goal Status: Met Patient/caregiver will maintain therapeutic glucose control Date Initiated: 10/24/2019 Target Resolution Date: 08/24/2022 Goal Status: Active Interventions: Assess HgA1c results as ordered upon admission and as needed Assess patient nutrition upon admission and as needed per policy Provide education  on elevated blood sugars and impact on wound healing Provide education on nutrition Treatment Activities: Education provided on Nutrition : 12/07/2021 Notes: 11/17/20: Glucose control ongoing issue, target date extended. 01/26/21: Glucose management continues. Wound/Skin Impairment Nursing Diagnoses: Impaired tissue integrity Knowledge deficit related to ulceration/compromised skin integrity Goals: Patient/caregiver will verbalize understanding of skin care regimen Date Initiated: 10/24/2019 Target Resolution Date: 08/24/2022 Goal Status: Active Ulcer/skin breakdown will have a volume reduction of 30% by week 4 Date Initiated: 10/24/2019 Date Inactivated: 01/16/2020 Target Resolution Date: 01/10/2020 Unmet Reason: no change in Goal Status: Unmet measurements. [place], [name] ([id number]) [id number] Page 4 of 8 Interventions: Assess patient/caregiver ability to obtain necessary supplies Assess patient/caregiver ability to perform ulcer/skin care regimen upon admission and as needed Assess ulceration(s) every visit Provide education on ulcer and skin care Notes: 11/17/20: Wound care regimen continues Electronic Signature(s) Signed: 08/10/2022 4:26:39 PM By: Zenaida Deed RN, BSN Entered By: Zenaida Deed on 08/10/2022 09:02:03 -------------------------------------------------------------------------------- Negative Pressure  Wound Therapy Maintenance (NPWT) Details Patient Name: Date of Service: [name] 08/10/2022 8:15 A M Medical Record Number: [id number] Patient Account Number: [id number] Date of Birth/Sex: Treating RN: May 04, 1985 (36 y.o. [name] Primary Care Karinda Cabriales: [name] Other Clinician: Referring Kim Oki: Treating Marinus Eicher/Extender: [name] in Treatment: 145 NPWT Maintenance Performed for: Wound #3 Left, Lateral, Plantar Foot Additional Injuries Covered: No Performed By: Zenaida Deed,  RN Coverage Size (sq cm): 7.2 Pressure Type: Constant Pressure Setting: 125 mmHG Drain Type: None Primary Contact: Other : prisma, antibiotic compound Sponge/Dressing Type: Foam- Black Date Initiated: 02/11/2022 Dressing Removed: Yes Quantity of Sponges/Gauze Removed: 1 Canister Changed: No Dressing Reapplied: Yes Quantity of Sponges/Gauze Inserted: 1 Days On NPWT : 181 Post Procedure Diagnosis Same as Pre-procedure Electronic Signature(s) Signed: 08/10/2022 4:26:39 PM By: Zenaida Deed RN, BSN Entered By: Zenaida Deed on 08/10/2022 09:14:58 -------------------------------------------------------------------------------- Pain Assessment Details Patient Name: Date of Service: [name], [name] 08/10/2022 8:15 A M Medical Record Number: [id number] Patient Account Number: [id number] Date of Birth/Sex: Treating RN: 1985-08-11 (36 y.o. M) Primary Care Tyah Acord: [name] Other Clinician: Referring Ronie Fleeger: Treating Jamare Vanatta/Extender: [name] in Treatment: [phone] Active Problems Location of Pain Severity and Description of Pain Patient Has Paino No Site Locations Rate the pain. [place], [name] ([id number]) [id number] Page 5 of 8 Rate the pain. Current Pain Level: 0 Pain Management and Medication Current Pain Management: Electronic Signature(s) Signed: 08/10/2022 4:26:39 PM By: Zenaida Deed RN, BSN Entered By: Zenaida Deed on 08/10/2022 08:52:23 -------------------------------------------------------------------------------- Patient/Caregiver Education Details Patient Name: Date of Service: [name], [name] 5/8/2024andnbsp8:15 A M Medical Record Number: [id number] Patient Account Number: [id number] Date of Birth/Gender: Treating RN: Apr 29, 1985 (36 y.o. [name] Primary Care Physician: [name] Other Clinician: Referring Physician: Treating Physician/Extender: [name] in Treatment: [phone] Education Assessment Education Provided To: Patient Education Topics Provided Elevated Blood Sugar/ Impact on Healing: Methods: Explain/Verbal Responses: Reinforcements needed, State content correctly Offloading: Methods: Explain/Verbal Responses: Reinforcements needed, State content correctly Wound/Skin Impairment: Methods: Explain/Verbal Responses: Reinforcements needed, State content correctly Electronic Signature(s) Signed: 08/10/2022 4:26:39 PM By: Zenaida Deed RN, BSN Entered By: Zenaida Deed on 08/10/2022 09:03:08 -------------------------------------------------------------------------------- Wound Assessment Details Patient Name: Date of Service: [name], [name] 08/10/2022 8:15 A M Medical Record Number: [id number] Patient Account Number: [id number] Date of Birth/Sex: Treating RN: [birth year] (36 y.o. M) Primary Care Primo Innis: [name] Other Clinician: Referring Claudie Rathbone: Treating Milyn Stapleton/Extender: [name] in Treatment: [address] [name] ([id number]) [id number] Page 6 of 8 Wound Status Wound Number: 13 Primary Etiology: Pressure Ulcer Wound Location: Left Knee Secondary Etiology: Diabetic Wound/Ulcer of the Lower Extremity Wounding Event: Pressure Injury Wound Status: Open Date Acquired: 07/15/2022 Comorbid History: Type II Diabetes Weeks Of Treatment: 2 Clustered Wound: No Photos Wound Measurements Length: (cm) 0.2 Width: (cm) 2 Depth: (cm) 0.1 Area: (cm) 0.314 Volume: (cm) 0.031 % Reduction in Area: 75.8% % Reduction in Volume: 76.2% Epithelialization: Small (1-33%) Wound Description Classification: Category/Stage III Wound Margin: Flat and Intact Exudate Amount: None Present Foul Odor After Cleansing: No Slough/Fibrino Yes Wound Bed Granulation Amount: None Present (0%) Exposed Structure Necrotic Amount: Large (67-100%) Fascia Exposed:  No Necrotic Quality: Eschar Fat Layer (Subcutaneous Tissue) Exposed: Yes Tendon Exposed: No Muscle Exposed: No Joint Exposed: No Bone Exposed: No Periwound Skin Texture Texture Color No Abnormalities Noted: Yes No Abnormalities Noted: Yes Moisture Temperature / Pain No Abnormalities Noted: Yes Temperature:  No Abnormality Treatment Notes Wound #13 (Knee) Wound Laterality: Left Cleanser Peri-Wound Care Topical Primary Dressing Maxorb Extra Ag+ Alginate Dressing, 4x4.75 (in/in) Discharge Instruction: Apply to wound bed as instructed Secondary Dressing Zetuvit Plus Silicone Border Dressing 4x4 (in/in) Discharge Instruction: Apply silicone border over primary dressing as directed. Secured With Compression Wrap Compression Stockings Add-Ons [place], [name] ([id number]) [id number] Page 7 of 8 Electronic Signature(s) Signed: 08/10/2022 4:26:39 PM By: Zenaida Deed RN, BSN Entered By: Zenaida Deed on 08/10/2022 10:08:03 -------------------------------------------------------------------------------- Wound Assessment Details Patient Name: Date of Service: [name], [name] 08/10/2022 8:15 A M Medical Record Number: [id number] Patient Account Number: [id number] Date of Birth/Sex: Treating RN: [birth year] (36 y.o. [name] Primary Care Charmain Diosdado: [name] Other Clinician: Referring Quashaun Lazalde: Treating Ulysse Siemen/Extender: [name] in Treatment: 145 Wound Status Wound Number: 3 Primary Etiology: Diabetic Wound/Ulcer of the Lower Extremity Wound Location: Left, Lateral, Plantar Foot Wound Status: Open Wounding Event: Trauma Comorbid History: Type II Diabetes Date Acquired: 10/02/2019 Weeks Of Treatment: 145 Clustered Wound: No Photos Wound Measurements Length: (cm) 2.4 Width: (cm) 3 Depth: (cm) 1 Area: (cm) 5.655 Volume: (cm) 5.655 % Reduction in Area: -242.9% % Reduction in Volume:  -3327.3% Epithelialization: Small (1-33%) Tunneling: No Undermining: No Wound Description Classification: Grade 2 Wound Margin: Well defined, not attached Exudate Amount: Medium Exudate Type: Serosanguineous Exudate Color: red, brown Foul Odor After Cleansing: No Slough/Fibrino Yes Wound Bed Granulation Amount: Large (67-100%) Exposed Structure Granulation Quality: Red Fascia Exposed: No Necrotic Amount: None Present (0%) Fat Layer (Subcutaneous Tissue) Exposed: Yes Tendon Exposed: No Muscle Exposed: No Joint Exposed: No Bone Exposed: No Periwound Skin Texture Texture Color No Abnormalities Noted: Yes No Abnormalities Noted: Yes Moisture Temperature / Pain No Abnormalities Noted: No Temperature: No Abnormality Dry / Scaly: No Maceration: Yes Treatment Notes Wound #3 (Foot) Wound Laterality: Plantar, Left, Lateral [place], [name] ([id number]) [id number] Page 8 of 8 Cleanser Soap and Water Discharge Instruction: May shower and wash wound with dial antibacterial soap and water prior to dressing change. Vashe 5.8 (oz) Discharge Instruction: Cleanse the wound with Vashe prior to applying a clean dressing, let sit on wound for 5-10 minutesusing gauze sponges, not tissue or cotton balls. Peri-Wound Care Skin Prep Discharge Instruction: Use skin prep as directed Topical keystone antibiotic compound Discharge Instruction: apply to base of wound Primary Dressing Promogran Prisma Matrix, 4.34 (sq in) (silver collagen) Discharge Instruction: Moisten collagen with saline or hydrogel VAC Secondary Dressing Zetuvit Absorbent Pad, 4x4 (in/in) Secured With Elastic Bandage 4 inch (ACE bandage) Discharge Instruction: Secure with ACE bandage as directed. 75M Medipore Soft Cloth Surgical T 2x10 (in/yd) ape Discharge Instruction: Secure with tape as directed. Compression Wrap Kerlix Roll 4.5x3.1 (in/yd) Discharge Instruction: Apply Kerlix and Coban  compression as directed. Compression Stockings Add-Ons Electronic Signature(s) Signed: 08/10/2022 4:26:39 PM By: Zenaida Deed RN, BSN Entered By: Zenaida Deed on 08/10/2022 10:08:32 -------------------------------------------------------------------------------- Vitals Details Patient Name: Date of Service: [name], [name] 08/10/2022 8:15 A M Medical Record Number: [id number] Patient Account Number: [id number] Date of Birth/Sex: Treating RN: Jun 20, 1985 (36 y.o. M) Primary Care Hayleen Clinkscales: [name] Other Clinician: Referring Antiono Ettinger: Treating Cordai Rodrigue/Extender: [name] in Treatment: 145 Vital Signs Time Taken: 08:42 Temperature (F): 98.5 Height (in): 77 Pulse (bpm): 101 Weight (lbs): 280 Respiratory Rate (breaths/min): 18 Body Mass Index (BMI): 33.2 Blood Pressure (mmHg): 125/78 Capillary Blood Glucose (mg/dl): 95 Reference Range: 80 - 120 mg / dl Notes glucose per pt report this am Electronic  Signature(s) Signed: 08/10/2022 4:26:39 PM By: Zenaida Deed RN, BSN Entered By: Zenaida Deed on 08/10/2022 08:52:14

## 2022-08-17 ENCOUNTER — Encounter (HOSPITAL_BASED_OUTPATIENT_CLINIC_OR_DEPARTMENT_OTHER): Payer: BC Managed Care – PPO | Admitting: General Surgery

## 2022-08-17 DIAGNOSIS — E11621 Type 2 diabetes mellitus with foot ulcer: Secondary | ICD-10-CM | POA: Diagnosis not present

## 2022-08-24 ENCOUNTER — Encounter (HOSPITAL_BASED_OUTPATIENT_CLINIC_OR_DEPARTMENT_OTHER): Payer: BC Managed Care – PPO | Admitting: General Surgery

## 2022-08-24 DIAGNOSIS — E11621 Type 2 diabetes mellitus with foot ulcer: Secondary | ICD-10-CM | POA: Diagnosis not present

## 2022-08-31 ENCOUNTER — Encounter (HOSPITAL_BASED_OUTPATIENT_CLINIC_OR_DEPARTMENT_OTHER): Payer: BC Managed Care – PPO | Admitting: General Surgery

## 2022-08-31 DIAGNOSIS — E11621 Type 2 diabetes mellitus with foot ulcer: Secondary | ICD-10-CM | POA: Diagnosis not present

## 2022-09-06 ENCOUNTER — Ambulatory Visit (INDEPENDENT_AMBULATORY_CARE_PROVIDER_SITE_OTHER): Payer: BC Managed Care – PPO | Admitting: Podiatry

## 2022-09-06 DIAGNOSIS — M79674 Pain in right toe(s): Secondary | ICD-10-CM

## 2022-09-06 DIAGNOSIS — M79675 Pain in left toe(s): Secondary | ICD-10-CM

## 2022-09-06 DIAGNOSIS — B351 Tinea unguium: Secondary | ICD-10-CM | POA: Diagnosis not present

## 2022-09-06 DIAGNOSIS — E1149 Type 2 diabetes mellitus with other diabetic neurological complication: Secondary | ICD-10-CM

## 2022-09-07 ENCOUNTER — Telehealth: Payer: Self-pay | Admitting: Podiatry

## 2022-09-07 ENCOUNTER — Ambulatory Visit (INDEPENDENT_AMBULATORY_CARE_PROVIDER_SITE_OTHER): Payer: BC Managed Care – PPO

## 2022-09-07 ENCOUNTER — Encounter: Payer: Self-pay | Admitting: Podiatry

## 2022-09-07 ENCOUNTER — Ambulatory Visit (INDEPENDENT_AMBULATORY_CARE_PROVIDER_SITE_OTHER): Payer: BC Managed Care – PPO | Admitting: Podiatry

## 2022-09-07 ENCOUNTER — Other Ambulatory Visit (HOSPITAL_BASED_OUTPATIENT_CLINIC_OR_DEPARTMENT_OTHER): Payer: Self-pay | Admitting: General Surgery

## 2022-09-07 ENCOUNTER — Encounter (HOSPITAL_BASED_OUTPATIENT_CLINIC_OR_DEPARTMENT_OTHER): Payer: BC Managed Care – PPO | Attending: General Surgery | Admitting: General Surgery

## 2022-09-07 DIAGNOSIS — L97524 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one: Secondary | ICD-10-CM | POA: Insufficient documentation

## 2022-09-07 DIAGNOSIS — E11621 Type 2 diabetes mellitus with foot ulcer: Secondary | ICD-10-CM | POA: Diagnosis not present

## 2022-09-07 DIAGNOSIS — L97522 Non-pressure chronic ulcer of other part of left foot with fat layer exposed: Secondary | ICD-10-CM | POA: Diagnosis not present

## 2022-09-07 DIAGNOSIS — L97426 Non-pressure chronic ulcer of left heel and midfoot with bone involvement without evidence of necrosis: Secondary | ICD-10-CM

## 2022-09-07 DIAGNOSIS — M79672 Pain in left foot: Secondary | ICD-10-CM | POA: Diagnosis not present

## 2022-09-07 NOTE — Telephone Encounter (Signed)
Pt was seen yesterday 09/06/22 However pt went to a Dr today and saw that now pt bone is now exposed.    I booked him for appointment 09/09/22 and added wait list. Would you need to see him sooner? If so where would you like him to be added?   Please advise

## 2022-09-07 NOTE — Progress Notes (Signed)
  Subjective:  Patient ID: [name], male    DOB: 02/08/1986,   MRN: [id number]  Chief Complaint  Patient presents with   Wound Check    Left foot open wound     37 y.o. male presents for concern of worsening left foot wound. 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 He has been following with wound care for left foot wound. Relates he was seen at wound care earlier today and they noticed exposed bone. They obtained a culture, put him on levofloxacin and told him to follow-up.  Denies any other pedal complaints. Denies n/v/f/. Does relates some chills in the morning when he wakes up.    Past Medical History:  Diagnosis Date   Diabetes mellitus    Osteomyelitis (HCC)     Objective:  Physical Exam: Vascular: DP/PT pulses 2/4 bilateral. CFT <3 seconds. Normal hair growth on digits. No edema.  Skin. No lacerations or abrasions bilateral feet. Plantar lateral left foot wound about 4 cm x 4 cm x 2 cm with probe to bone noted. No erythema edema or purulence noted. Some maceration noted peri wound.  Musculoskeletal: MMT 5/5 bilateral lower extremities in DF, PF, Inversion and Eversion. Deceased ROM in DF of ankle joint. Previous partial fifth ray amputations bilateral.  Neurological: Sensation intact to light touch. Protective sensation dimished.   Assessment:   1. Left foot pain   2. 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 Ulcer left foot wound  -Concern for probe to bone and pain in the foot. Discussed getting MRI to further evaluation. Discussed potentially further surgical debridement but if infection has spread to proximal then may potentially be better off with a below knee amputation vs long term antibiotics and ID referral. Did  discuss if after starting the oral antibiotics and does not have improvement to head straight to the ED.  -MRI ordered.  -Dressed with saline, DSD. Twice daily daikins dressing changes for now.  -Off-loading with Cam boot.  -Start the levofloxacin -Ordered CBC and ESR/CRP.  -No debridement necessary as seen wound care this morning.  -Discussed glucose control and proper protein-rich diet.  -Discussed if any worsening redness, pain, fever or chills to call or may need to report to the emergency room. Patient expressed understanding.  Follow-up in one week for recheck    Return in about 1 week (around 09/14/2022) for wound check.   Louann Sjogren, DPM

## 2022-09-09 ENCOUNTER — Ambulatory Visit: Payer: BC Managed Care – PPO | Admitting: Podiatry

## 2022-09-09 ENCOUNTER — Other Ambulatory Visit: Payer: Self-pay | Admitting: Podiatry

## 2022-09-14 ENCOUNTER — Encounter (HOSPITAL_BASED_OUTPATIENT_CLINIC_OR_DEPARTMENT_OTHER): Payer: BC Managed Care – PPO | Admitting: General Surgery

## 2022-09-14 DIAGNOSIS — L97524 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one: Secondary | ICD-10-CM | POA: Diagnosis not present

## 2022-09-15 ENCOUNTER — Ambulatory Visit (INDEPENDENT_AMBULATORY_CARE_PROVIDER_SITE_OTHER): Payer: BC Managed Care – PPO | Admitting: Podiatry

## 2022-09-15 VITALS — BP 139/83 | HR 100 | Temp 99.7°F

## 2022-09-15 DIAGNOSIS — L97426 Non-pressure chronic ulcer of left heel and midfoot with bone involvement without evidence of necrosis: Secondary | ICD-10-CM

## 2022-09-15 DIAGNOSIS — M86072 Acute hematogenous osteomyelitis, left ankle and foot: Secondary | ICD-10-CM

## 2022-09-15 DIAGNOSIS — E11621 Type 2 diabetes mellitus with foot ulcer: Secondary | ICD-10-CM

## 2022-09-16 NOTE — Progress Notes (Signed)
Subjective: Chief Complaint  Patient presents with   Diabetes    Patient came in today for Diabetic foot care, A1c- 8.0 Bg- 96, nail trim wound vac left     37 year old male presents the office today for diabetic foot exam, thick, elongated toes that he cannot trim himself.  He is still going to wound care center patient has a wound VAC on the left side.  He is seeing them tomorrow.  Objective: AAO x3, NAD Noted to be hypertrophic, dystrophic with yellow, brown discoloration with central debris digits 1-4 bilaterally. Wounds currently dressings on them and will defer to the wound care center as he is being actively treated by them. No pain with calf compression, swelling, warmth, erythema  Assessment: Symptomatic onychomycosis  Plan: -All treatment options discussed with the patient including all alternatives, risks, complications.  -Sharply debride the nails x8 without any complications or bleeding. -Will defer to the wound care center for further treatment of the wounds.  -Patient encouraged to call the office with any questions, concerns, change in symptoms.   Vivi Barrack DPM

## 2022-09-18 ENCOUNTER — Ambulatory Visit
Admission: RE | Admit: 2022-09-18 | Discharge: 2022-09-18 | Disposition: A | Discharge status: Home / Self Care | Payer: BC Managed Care – PPO | Source: Ambulatory Visit | Attending: Podiatry | Admitting: Podiatry

## 2022-09-18 DIAGNOSIS — E11621 Type 2 diabetes mellitus with foot ulcer: Secondary | ICD-10-CM

## 2022-09-18 DIAGNOSIS — M79672 Pain in left foot: Secondary | ICD-10-CM

## 2022-09-19 ENCOUNTER — Telehealth: Payer: Self-pay | Admitting: Podiatry

## 2022-09-19 ENCOUNTER — Ambulatory Visit (HOSPITAL_BASED_OUTPATIENT_CLINIC_OR_DEPARTMENT_OTHER): Payer: BC Managed Care – PPO | Admitting: General Surgery

## 2022-09-19 NOTE — Telephone Encounter (Signed)
[facility] was trying to reach a nurse to confirm MRI imaging for stat report  Please advise

## 2022-09-20 NOTE — Telephone Encounter (Signed)
Received. He needs a follow-up this week to go over results. Thanks

## 2022-09-21 ENCOUNTER — Ambulatory Visit (INDEPENDENT_AMBULATORY_CARE_PROVIDER_SITE_OTHER): Payer: BC Managed Care – PPO | Admitting: Podiatry

## 2022-09-21 DIAGNOSIS — L97426 Non-pressure chronic ulcer of left heel and midfoot with bone involvement without evidence of necrosis: Secondary | ICD-10-CM

## 2022-09-21 DIAGNOSIS — E11621 Type 2 diabetes mellitus with foot ulcer: Secondary | ICD-10-CM

## 2022-09-21 NOTE — Progress Notes (Signed)
Chief Complaint  Patient presents with   Follow-up    MRI Results     Subjective:  37 y.o. 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 Victorino Dike MD on 05/16/2019 presenting to review the MRI results today and discuss further treatment options for the nonhealing ulcer to the left foot.  He changes his dressings daily.  Patient has a long history of recurrent osteomyelitis with multiple surgeries and debridements and is currently being seen routinely at the wound care center.  Most recently at the wound care center there was exposed bone and a portion of the bone was harvested and sent to pathology for culture.  MRI was ordered and completed on 09/18/2022.   Past Medical History:  Diagnosis Date   Diabetes mellitus    Osteomyelitis [facility])     Past Surgical History:  Procedure Laterality Date   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;  Surgeon: Toni Arthurs, MD;  Location: [facility];  Service: Orthopedics;  Laterality: Left;   AMPUTATION Right 10/22/2020   Procedure: Right Fifth ray amputation;  Surgeon: Toni Arthurs, MD;  Location: [facility];  Service: Orthopedics;  Laterality: Right;   I & D EXTREMITY Bilateral 10/30/2021   Procedure: DEBRIDEMENT AND GRAFT APPLICATION;  Surgeon: Edwin Cap, DPM;  Location: [facility] OR;  Service: Podiatry;  Laterality: Bilateral;   WOUND DEBRIDEMENT Bilateral 10/02/2019   Procedure: DEBRIDEMENT WOUNDS BOTH LOWER EXTREMITIES WITH BONE BIOPSIES;  Surgeon: Vivi Barrack, DPM;  Location: [facility] OR;  Service: Podiatry;  Laterality: Bilateral;   WOUND DEBRIDEMENT Right 10/27/2021   Procedure: RIGHT FOOT DEBRIDEMENT WOUND AND BONE BIOPSY;  Surgeon: Edwin Cap, DPM;  Location: [facility] OR;  Service: Podiatry;  Laterality: Right;    Allergies  Allergen Reactions   Basaglar Kwikpen [Insulin Glargine] Diarrhea   Metformin And Related Diarrhea and  Nausea And Vomiting   Trulicity [Dulaglutide] Diarrhea    LT foot 09/21/2022  Objective/Physical Exam General: The patient is alert and oriented x3 in no acute distress.  Dermatology:  Large decubitus ulcer noted to the plantar aspect of the left foot measuring approximately 3.0 x 4.0 x 1.0 cm.   (LxWxD). There is exposed bone to the central portion of the wound..  Granular wound base.  No malodor.  No purulence.  Heavy serosanguineous drainage noted.  Periwound is intact and mildly macerated.  Vascular: Skin warm to touch.  Pulses palpable.  No appreciable erythema in the foot or ankle.  Neurological: Light touch and protective threshold diminished bilaterally.   Musculoskeletal Exam: History of prior partial ray amputations bilateral  MR FOOT LEFT WO CONTRAST 09/18/2022 IMPRESSION: 1. Plantar soft tissue ulceration at the lateral aspect of the foot underlying the fifth metatarsal base. Acute osteomyelitis of the fifth metatarsal base extending to the proximal diaphysis. 2. Small fourth and fifth TMT joint effusion. Subchondral marrow edema within the distal aspect of the cuboid, and to a lesser degree within the fourth metatarsal base. Given proximity of soft tissue infection, findings are suspicious for septic arthritis and early acute osteomyelitis. 3. Diffuse edema-like signal of the foot musculature which may represent changes related to denervation and/or myositis. 4. Tendinosis of the tibialis posterior tendon distally.   These results will be called to the ordering clinician or representative by the Radiologist Assistant, and communication documented in the PACS or [employer].  Assessment: 1.  Ulcer left foot  secondary to diabetes mellitus 2.  Osteomyelitis fifth metatarsal left foot   Plan of Care: -Patient was evaluated.  MRI reviewed with the patient -Patient states that a portion of bone was harvested and sent to pathology although I do not see the order or  results in the patient's chart. -Discussed the need for surgery.  I do believe that the patient would require excision of the remaining portion of the fifth metatarsal based on findings of the MRI and the exposed bone to the underlying ulcer.  Risk benefits advantages and disadvantages of the procedure were explained in detail to the patient.  No guarantees were expressed or implied.  The patient has gone through multiple surgeries and prior amputations before and he understands.  He is amenable to the plan for excision of the remaining portion of the fifth metatarsal as well as bone biopsy and debridement of ulcer with possible application of skin graft. -Authorization for surgery was initiated today.  Surgery will consist of excision of the remaining portion of the fifth metatarsal as well as bone biopsy and debridement of ulcer with possible application of skin graft. -Patient states that he is currently taking doxycycline.  Surgery tentatively scheduled for next week.  Recommend discontinuing the doxycycline for more accurate bone biopsy and culture -Continue daily dressing changes -Referral placed for infectious disease.  Recommend long-term IV antibiotics based on culture results -Return to clinic 1 week postop  Felecia Shelling, DPM [facility]  Dr. Felecia Shelling, DPM    [address] [place], [place] [address]                Office [phone]  Fax [phone]

## 2022-09-21 NOTE — Progress Notes (Signed)
Subjective: Chief Complaint  Patient presents with   Foot Ulcer    Wound check, bottom left foot and also dorsal area. Patient states no n/v/ f noted. Slight drainage and no smell noted. Seen Dr. Ralene Cork on 6/5 and she order an MRI, patient is schedule for the MRI on Sunday September 18, 2022. Wound ordered the patient antibiotics as well.    Patient-year-old male presents today for follow-up evaluation of a wound to the left foot.  I saw him the other week for routine care and he had a wound VAC on his follow-up with the wound care center. He followed up with the wound care center this was removed and the wound has worsened and now probing to bone.  He followed up with Dr. Ralene Cork and MRI was ordered which is still not completed yet.  He has questions on how this got infected.  Culture was obtained by the wound care center and is placed on antibiotics for MRSA. No fever or chills.   Objective: AAO x3, NAD DP/PT pulses palpable bilaterally, CRT less than 3 seconds Large full-thickness ulceration to the lateral aspect of the foot which has progressively gotten to the fifth metatarsal.  There is chronic edema.  Mild warmth around the wound there is no drainage or pus.  There is no ascending cellulitis. No pain with calf compression, swelling, warmth, erythema  Assessment: Ulcer patient with osteomyelitis left foot  Plan: -All treatment options discussed with the patient including all alternatives, risks, complications.  -Etiology of symptoms were discussed- in particular discussed etiology of infection.  -I again reviewed the x-rays that were taken and reviewed with the patient.  I do agree with the MRI which is pending and scheduled early next week.  I discussed with him surgical intervention but await the results of the MRI before proceeding with further surgery as needed to determine the extent of the infection.  Continue antibiotics for now per wound care center based on cultures which I am not able  to see today. -Dr. Ralene Cork ordered blood work, not yet completed and I gave the requisition today. -Monitor for any clinical signs or symptoms of infection and directed to call the office immediately should any occur or go to the ER. -Patient encouraged to call the office with any questions, concerns, change in symptoms.   Vivi Barrack DPM

## 2022-09-22 ENCOUNTER — Telehealth: Payer: Self-pay | Admitting: Urology

## 2022-09-22 NOTE — Telephone Encounter (Signed)
DOS - 09/28/22  LEFT 5TH MET RESECTION --- [address] DEBRIDEMENT OF ULCER LEFT --- 11043 BONE BIOPSY LEFT --- 20240 POSSIBLE GRAFT APPLICATION LEFT --- 15275  [employer] EFFECTIVE DATE - 04/04/22  DEDUCTIBLE -$1,250.00 W/ $0.00 REMAINING OOP - $4,890.00 W/ $0.00 REMIANING COINSURANCE - 20%  SPOKE WITH MONIQUE WITH [employer] AND SHE STATED THAT FOR CPT CODES 60454, 11043, [id number] AND 15275 NO PRIOR AUTH IS REQUIRED.  CALL REF # MONIQUE C. 09/22/22 AT 9:30 AM EST

## 2022-09-23 ENCOUNTER — Ambulatory Visit (HOSPITAL_BASED_OUTPATIENT_CLINIC_OR_DEPARTMENT_OTHER): Payer: BC Managed Care – PPO | Admitting: General Surgery

## 2022-09-26 NOTE — Progress Notes (Signed)
Pt. Needs orders for surgery and H & P. 

## 2022-09-26 NOTE — Telephone Encounter (Signed)
[name] ADVISED THAT HIS PCP WAS OUT OF OFFICE AND NOT ABLE TO DO HIS H&P. WE TALKED ABOUT ANOTHER PHYSICAN IN HIS PCP'S PRACTICE BEING ABLE TO DO HIS H&P. HE STATED THAT HE CALLED SEVERAL TIMES ATTEMPTING TO GET AN APPOINTMENT. I ADVISED THAT IF HE WAS NOT ABLE TO GET HIS H&P DONE BY TOMORROW AFTERNOON HE WOULD NOT BE ABLE TO PROCEED WITH SURGERY ON 09/28/22. HE SAID THAT IF HE COULD NOT GET AN APPOINTMENT FOR H&P, HE WOULD CALL TOMORROW TO RECHED SURGERY, POSSIBLY FOR JULY 10TH.

## 2022-09-26 NOTE — Patient Instructions (Signed)
DUE TO COVID-19 ONLY TWO VISITORS  (aged 36 and older)  ARE ALLOWED TO COME WITH YOU AND STAY IN THE WAITING ROOM ONLY DURING PRE OP AND PROCEDURE.   **NO VISITORS ARE ALLOWED IN THE SHORT STAY AREA OR RECOVERY ROOM!!**  IF YOU WILL BE ADMITTED INTO THE HOSPITAL YOU ARE ALLOWED ONLY FOUR SUPPORT PEOPLE DURING VISITATION HOURS ONLY (7 AM -8PM)   The support person(s) must pass our screening, gel in and out, and wear a mask at all times, including in the patient's room. Patients must also wear a mask when staff or their support person are in the room. Visitors GUEST BADGE MUST BE WORN VISIBLY  One adult visitor may remain with you overnight and MUST be in the room by 8 P.M.     Your procedure is scheduled on: 09/28/22   Report to [facility] Main Entrance    Report to admitting at : 11:15 AM   Call this number if you have problems the morning of surgery [phone]   Do not eat food :After Midnight.   After Midnight you may have the following liquids until : 10:30 AM DAY OF SURGERY  Water Black Coffee (sugar ok, NO MILK/CREAM OR CREAMERS)  Tea (sugar ok, NO MILK/CREAM OR CREAMERS) regular and decaf                             Plain Jell-O (NO RED)                                           Fruit ices (not with fruit pulp, NO RED)                                     Popsicles (NO RED)                                                                  Juice: apple, WHITE grape, WHITE cranberry Sports drinks like Gatorade (NO RED)               Oral Hygiene is also important to reduce your risk of infection.                                    Remember - BRUSH YOUR TEETH THE MORNING OF SURGERY WITH YOUR REGULAR TOOTHPASTE  DENTURES WILL BE REMOVED PRIOR TO SURGERY PLEASE DO NOT APPLY "Poly grip" OR ADHESIVES!!!   Do NOT smoke after Midnight   Take these medicines the morning of surgery with A SIP OF WATER: doxycycline.  How to Manage Your Diabetes Before and After  Surgery  Why is it important to control my blood sugar before and after surgery? Improving blood sugar levels before and after surgery helps healing and can limit problems. A way of improving blood sugar control is eating a healthy diet by:  Eating less sugar and carbohydrates  Increasing activity/exercise  Talking with your doctor about reaching your  blood sugar goals High blood sugars (greater than 180 mg/dL) can raise your risk of infections and slow your recovery, so you will need to focus on controlling your diabetes during the weeks before surgery. Make sure that the doctor who takes care of your diabetes knows about your planned surgery including the date and location.  How do I manage my blood sugar before surgery? Check your blood sugar at least 4 times a day, starting 2 days before surgery, to make sure that the level is not too high or low. Check your blood sugar the morning of your surgery when you wake up and every 2 hours until you get to the Short Stay unit. If your blood sugar is less than 70 mg/dL, you will need to treat for low blood sugar: Do not take insulin. Treat a low blood sugar (less than 70 mg/dL) with  cup of clear juice (cranberry or apple), 4 glucose tablets, OR glucose gel. Recheck blood sugar in 15 minutes after treatment (to make sure it is greater than 70 mg/dL). If your blood sugar is not greater than 70 mg/dL on recheck, call [phone] for further instructions. Report your blood sugar to the short stay nurse when you get to Short Stay.  If you are admitted to the hospital after surgery: Your blood sugar will be checked by the staff and you will probably be given insulin after surgery (instead of oral diabetes medicines) to make sure you have good blood sugar levels. The goal for blood sugar control after surgery is 80-180 mg/dL.   WHAT DO I DO ABOUT MY DIABETES MEDICATION?  THE NIGHT BEFORE SURGERY, take ONLY half of the lantus insulin dose.     THE  MORNING OF SURGERY, DO NOT TAKE ANY ORAL DIABETIC MEDICATIONS DAY OF YOUR SURGERY  If your CBG is greater than 220 mg/dL, you may take  of your sliding scale  (correction) dose of insulin.  DO NOT TAKE THE FOLLOWING 7 DAYS PRIOR TO SURGERY: Ozempic, Wegovy, Rybelsus (Semaglutide), Byetta (exenatide), Bydureon (exenatide ER), Victoza, Saxenda (liraglutide), or Trulicity (dulaglutide) Mounjaro (Tirzepatide) Adlyxin (Lixisenatide), Polyethylene Glycol Loxenatide.    Bring CPAP mask and tubing day of surgery.                              You may not have any metal on your body including hair pins, jewelry, and body piercing             Do not wear lotions, powders, perfumes/cologne, or deodorant              Men may shave face and neck.   Do not bring valuables to the hospital. [facility] IS NOT             RESPONSIBLE   FOR VALUABLES.   Contacts, glasses, or bridgework may not be worn into surgery.   Bring small overnight bag day of surgery.   DO NOT BRING YOUR HOME MEDICATIONS TO THE HOSPITAL. PHARMACY WILL DISPENSE MEDICATIONS LISTED ON YOUR MEDICATION LIST TO YOU DURING YOUR ADMISSION IN THE HOSPITAL!    Patients discharged on the day of surgery will not be allowed to drive home.  Someone NEEDS to stay with you for the first 24 hours after anesthesia.   Special Instructions: Bring a copy of your healthcare power of attorney and living will documents         the day of surgery if you  haven't scanned them before.              Please read over the following fact sheets you were given: IF YOU HAVE QUESTIONS ABOUT YOUR PRE-OP INSTRUCTIONS PLEASE CALL [phone]    [facility] - Preparing for Surgery Before surgery, you can play an important role.  Because skin is not sterile, your skin needs to be as free of germs as possible.  You can reduce the number of germs on your skin by washing with CHG (chlorahexidine gluconate) soap before surgery.  CHG is an antiseptic cleaner which kills  germs and bonds with the skin to continue killing germs even after washing. Please DO NOT use if you have an allergy to CHG or antibacterial soaps.  If your skin becomes reddened/irritated stop using the CHG and inform your nurse when you arrive at Short Stay. Do not shave (including legs and underarms) for at least 48 hours prior to the first CHG shower.  You may shave your face/neck. Please follow these instructions carefully:  1.  Shower with CHG Soap the night before surgery and the  morning of Surgery.  2.  If you choose to wash your hair, wash your hair first as usual with your  normal  shampoo.  3.  After you shampoo, rinse your hair and body thoroughly to remove the  shampoo.                           4.  Use CHG as you would any other liquid soap.  You can apply chg directly  to the skin and wash                       Gently with a scrungie or clean washcloth.  5.  Apply the CHG Soap to your body ONLY FROM THE NECK DOWN.   Do not use on face/ open                           Wound or open sores. Avoid contact with eyes, ears mouth and genitals (private parts).                       Wash face,  Genitals (private parts) with your normal soap.             6.  Wash thoroughly, paying special attention to the area where your surgery  will be performed.  7.  Thoroughly rinse your body with warm water from the neck down.  8.  DO NOT shower/wash with your normal soap after using and rinsing off  the CHG Soap.                9.  Pat yourself dry with a clean towel.            10.  Wear clean pajamas.            11.  Place clean sheets on your bed the night of your first shower and do not  sleep with pets. Day of Surgery : Do not apply any lotions/deodorants the morning of surgery.  Please wear clean clothes to the hospital/surgery center.  FAILURE TO FOLLOW THESE INSTRUCTIONS MAY RESULT IN THE CANCELLATION OF YOUR SURGERY PATIENT SIGNATURE_________________________________  NURSE  SIGNATURE__________________________________  ________________________________________________________________________

## 2022-09-27 ENCOUNTER — Encounter (HOSPITAL_COMMUNITY)
Admission: RE | Admit: 2022-09-27 | Discharge: 2022-09-27 | Disposition: A | Discharge status: Home / Self Care | Payer: BC Managed Care – PPO | Source: Ambulatory Visit | Attending: Anesthesiology | Admitting: Anesthesiology

## 2022-09-27 ENCOUNTER — Telehealth: Payer: Self-pay

## 2022-09-27 ENCOUNTER — Encounter (HOSPITAL_BASED_OUTPATIENT_CLINIC_OR_DEPARTMENT_OTHER): Payer: Self-pay

## 2022-09-27 DIAGNOSIS — E1036 Type 1 diabetes mellitus with diabetic cataract: Secondary | ICD-10-CM

## 2022-09-27 NOTE — Telephone Encounter (Signed)
Cancelled surgery with Dr. Ardelle Anton on 09/28/2022. He has not been able to get an appointment with his PCP to get the H&P for surgery.

## 2022-09-28 ENCOUNTER — Telehealth: Payer: Self-pay | Admitting: Podiatry

## 2022-09-28 ENCOUNTER — Encounter (HOSPITAL_COMMUNITY): Admission: RE | Payer: Self-pay | Source: Home / Self Care

## 2022-09-28 ENCOUNTER — Ambulatory Visit (HOSPITAL_COMMUNITY): Admission: RE | Admit: 2022-09-28 | Payer: BC Managed Care – PPO | Source: Home / Self Care | Admitting: Podiatry

## 2022-09-28 ENCOUNTER — Encounter (HOSPITAL_BASED_OUTPATIENT_CLINIC_OR_DEPARTMENT_OTHER): Payer: BC Managed Care – PPO | Admitting: General Surgery

## 2022-09-28 DIAGNOSIS — L97524 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one: Secondary | ICD-10-CM | POA: Diagnosis not present

## 2022-09-28 SURGERY — BIOPSY, BONE
Anesthesia: Choice | Site: Toe | Laterality: Left

## 2022-09-28 NOTE — Telephone Encounter (Signed)
HAVE CALLED PT MULTIPLE TIMES IN REGARDS TO H&P. PT CALLED IN TO ADVISE THAT HE STILL DOES NOT HAVE H&P SCHEDULED. REGULAR PCP IS OUT THIS WEEK, ADVISED THAT THE CAN SEE ANOTHER PRIMARY PHYSICIAN. HE STATES THAT HE HAS NOT RECEIVED A CALL FROM HIS PCP OFFICE ABOUT SEEING ANOTHER PROVIDER. ADVISED PT THAT WE CAN NOT SCHEDULE SX UNTIL HE HAS H&P APPT SCHEDULED. PT ASKED WHAT TO DO IF WOUND WORSENS. ADVISED TO GO TO ER.

## 2022-09-29 NOTE — Progress Notes (Signed)
[place], [name] ([id number]) [id number] Page 1 of 10 Visit Report for 09/28/2022 Arrival Information Details Patient Name: Date of Service: [name] 09/28/2022 3:30 PM Medical Record Number: [id number] Patient Account Number: [id number] Date of Birth/Sex: Treating RN: 1985-09-23 (37 y.o. Yates Decamp Primary Care Nyles Mitton: [name] Other Clinician: Referring Karista Aispuro: Treating Laverna Dossett/Extender: [name] in Treatment: 152 Visit Information History Since Last Visit All ordered tests and consults were completed: Yes Patient Arrived: Ambulatory Added or deleted any medications: No Arrival Time: 15:43 Any new allergies or adverse reactions: No Accompanied By: self Had a fall or experienced change in No Transfer Assistance: None activities of daily living that may affect Patient Identification Verified: Yes risk of falls: Secondary Verification Process Completed: Yes Signs or symptoms of abuse/neglect since last visito No Patient Requires Transmission-Based Precautions: No Hospitalized since last visit: No Patient Has Alerts: No Implantable device outside of the clinic excluding No cellular tissue based products placed in the center since last visit: Has Dressing in Place as Prescribed: Yes Pain Present Now: No Electronic Signature(s) Signed: 09/29/2022 12:38:14 PM By: Brenton Grills Entered By: Brenton Grills on 09/28/2022 15:44:29 -------------------------------------------------------------------------------- Encounter Discharge Information Details Patient Name: Date of Service: [name], [name] 09/28/2022 3:30 PM Medical Record Number: [id number] Patient Account Number: [id number] Date of Birth/Sex: Treating RN: 15-Jun-1985 (37 y.o. Yates Decamp Primary Care Valera Vallas: [name] Other Clinician: Referring Raiford Fetterman: Treating Erva Koke/Extender: [name] in  Treatment: [phone] Encounter Discharge Information Items Post Procedure Vitals Discharge Condition: Stable Temperature (F): 98.2 Ambulatory Status: Ambulatory Pulse (bpm): 78 Discharge Destination: Home Respiratory Rate (breaths/min): 18 Transportation: Private Auto Blood Pressure (mmHg): 142/78 Accompanied By: self Schedule Follow-up Appointment: Yes Clinical Summary of Care: Patient Declined Electronic Signature(s) Signed: 09/29/2022 12:38:14 PM By: Brenton Grills Entered By: Brenton Grills on 09/28/2022 16:29:44 [place], [name] ([id number]) [id number] Page 2 of 10 -------------------------------------------------------------------------------- Lower Extremity Assessment Details Patient Name: Date of Service: [name] 09/28/2022 3:30 PM Medical Record Number: [id number] Patient Account Number: [id number] Date of Birth/Sex: Treating RN: 08/01/1985 (37 y.o. Yates Decamp Primary Care Ennio Houp: [name] Other Clinician: Referring Mosi Hannold: Treating Kenna Seward/Extender: [name] in Treatment: 152 Edema Assessment Assessed: [name]: No] Franne Forts: No] Edema: [Left: Ye] [Right: s] Calf Left: Right: Point of Measurement: 48 cm From Medial Instep 49.5 cm Ankle Left: Right: Point of Measurement: 11 cm From Medial Instep 29.3 cm Vascular Assessment Pulses: Dorsalis Pedis Palpable: [Left:Yes] Electronic Signature(s) Signed: 09/29/2022 12:38:14 PM By: Brenton Grills Entered By: Brenton Grills on 09/28/2022 15:47:00 -------------------------------------------------------------------------------- Multi Wound Chart Details Patient Name: Date of Service: [name], [name] 09/28/2022 3:30 PM Medical Record Number: [id number] Patient Account Number: [id number] Date of Birth/Sex: Treating RN: 1985-08-02 (37 y.o. M) Primary Care Hussien Greenblatt: [name] Other Clinician: Referring Abriel Hattery: Treating Kayani Rapaport/Extender: [name] in Treatment: 152 Vital Signs Height(in): 77 Capillary Blood Glucose(mg/dl): 025 Weight(lbs): 427 Pulse(bpm): 106 Body Mass Index(BMI): 33.2 Blood Pressure(mmHg): 153/85 Temperature(F): 99.6 Respiratory Rate(breaths/min): 18 [14:Photos:] [id number] Page 3 of 10] Left, Dorsal Foot Right, Dorsal Ankle Left, Lateral, Plantar Foot Wound Location: Pressure Injury Gradually Appeared Trauma Wounding Event: Diabetic Wound/Ulcer of the Lower Diabetic Wound/Ulcer of the Lower Diabetic Wound/Ulcer of the Lower Primary Etiology: Extremity Extremity Extremity Type II Diabetes Type II Diabetes Type II Diabetes Comorbid History: 09/05/2022 09/13/2022 10/02/2019 Date Acquired: 3 0 152 Weeks of Treatment: Open Open Open Wound Status: No No No Wound  Recurrence: 0.7x1.2x0.1 1.2x2.2x0.5 2.3x2.7x1.8 Measurements L x W x [name] 0.66 2.073 4.877 A (cm) : rea 0.066 1.037 8.779 Volume (cm) : 40.00% N/A -195.80% % Reduction in A rea: 40.00% N/A -5220.60% % Reduction in Volume: 11 Starting Position 1 (o'clock): 12 Ending Position 1 (o'clock): 3.2 Maximum Distance 1 (cm): No Yes No Undermining: Grade 1 Grade 2 Grade 3 Classification: Medium Medium Medium Exudate A mount: Serosanguineous Serosanguineous Serosanguineous Exudate Type: red, brown red, brown red, brown Exudate Color: Flat and Intact N/A Distinct, outline attached Wound Margin: Large (67-100%) Medium (34-66%) Large (67-100%) Granulation A mount: Red Red, Pink Red, Pink Granulation Quality: Small (1-33%) N/A Small (1-33%) Necrotic A mount: Fat Layer (Subcutaneous Tissue): Yes Tendon: Yes Fat Layer (Subcutaneous Tissue): Yes Exposed Structures: Fascia: No Bone: Yes Tendon: No Fascia: No Muscle: No Tendon: No Joint: No Muscle: No Bone: No Joint: No Small (1-33%) N/A Small (1-33%) Epithelialization: Debridement - Excisional Debridement - Excisional  Debridement - Excisional Debridement: Pre-procedure Verification/Time Out 16:13 16:13 16:13 Taken: Lidocaine 4% Topical Solution Lidocaine 4% Topical Solution Lidocaine 4% Topical Solution Pain Control: Subcutaneous, Slough Muscle, Subcutaneous, Slough Bone, Callus, Slough Tissue Debrided: Skin/Subcutaneous Tissue Skin/Subcutaneous Tissue/Muscle Skin/Subcutaneous Level: Tissue/Muscle/Bone 0.66 2.07 4.87 Debridement A (sq cm): rea Curette Curette Curette Instrument: Minimum Minimum Minimum Bleeding: Pressure Pressure Pressure Hemostasis Achieved: 0 0 0 Procedural Pain: 0 0 0 Post Procedural Pain: Procedure was tolerated well Procedure was tolerated well Procedure was tolerated well Debridement Treatment Response: 0.7x1.2x0.1 1.2x2.2x0.5 2.3x2.7x1.8 Post Debridement Measurements L x W x [name] 0.066 1.037 8.779 Post Debridement Volume: (cm) Callus: Yes Scarring: Yes Callus: Yes Periwound Skin Texture: Maceration: Yes Maceration: No Periwound Skin Moisture: Dry/Scaly: No No Abnormalities Noted Hemosiderin Staining: Yes No Abnormalities Noted Periwound Skin Color: No Abnormality N/A No Abnormality Temperature: Debridement Debridement Debridement Procedures Performed: Treatment Notes Electronic Signature(s) Signed: 09/28/2022 4:28:19 PM By: Duanne Guess MD FACS Entered By: Duanne Guess on 09/28/2022 16:28:19 -------------------------------------------------------------------------------- Multi-Disciplinary Care Plan Details Patient Name: Date of Service: [name], [name] 09/28/2022 3:30 PM Medical Record Number: [id number] Patient Account Number: [id number] Date of Birth/Sex: Treating RN: September 16, 1985 (37 y.o. Yates Decamp Primary Care Shadonna Benedick: [name] Other Clinician: Referring Summerlynn Glauser: Treating Beautiful Pensyl/Extender: [name] in Treatment: [address] [name] ([id number]) [id number] Page 4  of 10 Multidisciplinary Care Plan reviewed with physician Active Inactive Nutrition Nursing Diagnoses: Imbalanced nutrition Potential for alteratiion in Nutrition/Potential for imbalanced nutrition Goals: Patient/caregiver agrees to and verbalizes understanding of need to use nutritional supplements and/or vitamins as prescribed Date Initiated: 10/24/2019 Date Inactivated: 04/06/2020 Target Resolution Date: 04/03/2020 Goal Status: Met Patient/caregiver will maintain therapeutic glucose control Date Initiated: 10/24/2019 Target Resolution Date: 09/21/2022 Goal Status: Active Interventions: Assess HgA1c results as ordered upon admission and as needed Assess patient nutrition upon admission and as needed per policy Provide education on elevated blood sugars and impact on wound healing Provide education on nutrition Treatment Activities: Education provided on Nutrition : 12/07/2021 Notes: 11/17/20: Glucose control ongoing issue, target date extended. 01/26/21: Glucose management continues. Wound/Skin Impairment Nursing Diagnoses: Impaired tissue integrity Knowledge deficit related to ulceration/compromised skin integrity Goals: Patient/caregiver will verbalize understanding of skin care regimen Date Initiated: 10/24/2019 Target Resolution Date: 09/21/2022 Goal Status: Active Ulcer/skin breakdown will have a volume reduction of 30% by week 4 Date Initiated: 10/24/2019 Date Inactivated: 01/16/2020 Target Resolution Date: 01/10/2020 Unmet Reason: no change in Goal Status: Unmet measurements. Interventions: Assess patient/caregiver ability to obtain necessary supplies Assess patient/caregiver ability to perform ulcer/skin  care regimen upon admission and as needed Assess ulceration(s) every visit Provide education on ulcer and skin care Notes: 11/17/20: Wound care regimen continues Electronic Signature(s) Signed: 09/29/2022 12:38:14 PM By: Brenton Grills Entered By: Brenton Grills on  09/28/2022 16:05:03 -------------------------------------------------------------------------------- Pain Assessment Details Patient Name: Date of Service: [name] 09/28/2022 3:30 PM Medical Record Number: [id number] Patient Account Number: [id number] Date of Birth/Sex: Treating RN: 09/09/1985 (37 y.o. Yates Decamp Primary Care Omarius Grantham: [name] Other Clinician: Referring Anays Detore: Treating Shamiyah Ngu/Extender: [name] in Treatment: [address] [name] ([id number]) [id number] Page 5 of 10 Active Problems Location of Pain Severity and Description of Pain Patient Has Paino No Site Locations Pain Management and Medication Current Pain Management: Electronic Signature(s) Signed: 09/29/2022 12:38:14 PM By: Brenton Grills Entered By: Brenton Grills on 09/28/2022 15:46:42 -------------------------------------------------------------------------------- Patient/Caregiver Education Details Patient Name: Date of Service: [name] 6/26/2024andnbsp3:30 PM Medical Record Number: [id number] Patient Account Number: [id number] Date of Birth/Gender: Treating RN: Dec 22, 1985 (37 y.o. Yates Decamp Primary Care Physician: [name] Other Clinician: Referring Physician: Treating Physician/Extender: [name] in Treatment: 152 Education Assessment Education Provided To: Patient Education Topics Provided Wound/Skin Impairment: Methods: Explain/Verbal Responses: State content correctly [employer]) Signed: 09/29/2022 12:38:14 PM By: Brenton Grills Entered By: Brenton Grills on 09/28/2022 16:05:24 [place], [name] ([id number]) [id number] Page 6 of 10 -------------------------------------------------------------------------------- Wound Assessment Details Patient Name: Date of Service: [name] 09/28/2022 3:30 PM Medical Record  Number: [id number] Patient Account Number: [id number] Date of Birth/Sex: Treating RN: 1985/10/08 (37 y.o. Yates Decamp Primary Care Burnadette Baskett: [name] Other Clinician: Referring Anyelina Claycomb: Treating Suni Jarnagin/Extender: [name] in Treatment: 152 Wound Status Wound Number: 14 Primary Etiology: Diabetic Wound/Ulcer of the Lower Extremity Wound Location: Left, Dorsal Foot Wound Status: Open Wounding Event: Pressure Injury Comorbid History: Type II Diabetes Date Acquired: 09/05/2022 Weeks Of Treatment: 3 Clustered Wound: No Photos Wound Measurements Length: (cm) 0.7 Width: (cm) 1.2 Depth: (cm) 0.1 Area: (cm) 0.66 Volume: (cm) 0.066 % Reduction in Area: 40% % Reduction in Volume: 40% Epithelialization: Small (1-33%) Tunneling: No Undermining: No Wound Description Classification: Grade 1 Wound Margin: Flat and Intact Exudate Amount: Medium Exudate Type: Serosanguineous Exudate Color: red, brown Foul Odor After Cleansing: No Slough/Fibrino Yes Wound Bed Granulation Amount: Large (67-100%) Exposed Structure Granulation Quality: Red Fascia Exposed: No Necrotic Amount: Small (1-33%) Fat Layer (Subcutaneous Tissue) Exposed: Yes Necrotic Quality: Adherent Slough Tendon Exposed: No Muscle Exposed: No Joint Exposed: No Bone Exposed: No Periwound Skin Texture Texture Color No Abnormalities Noted: Yes No Abnormalities Noted: Yes Moisture Temperature / Pain No Abnormalities Noted: Yes Temperature: No Abnormality Treatment Notes Wound #14 (Foot) Wound Laterality: Dorsal, Left Cleanser Peri-Wound Care Topical Primary Dressing Maxorb Extra Ag+ Alginate Dressing, 2x2 (in/in) Discharge Instruction: Apply to wound bed as instructed [place], [name] ([id number]) [id number] Page 7 of 10 Secondary Dressing Zetuvit Plus Silicone Border Dressing 4x4 (in/in) Discharge Instruction: Apply silicone border over primary  dressing as directed. Zetuvit Plus Silicone Border Dressing 5x5 (in/in) Discharge Instruction: Apply silicone border over primary dressing as directed. Secured With Compression Wrap Compression Stockings Add-Ons Electronic Signature(s) Signed: 09/29/2022 12:38:14 PM By: Brenton Grills Entered By: Brenton Grills on 09/28/2022 16:03:10 -------------------------------------------------------------------------------- Wound Assessment Details Patient Name: Date of Service: [name] 09/28/2022 3:30 PM Medical Record Number: [id number] Patient Account Number: [id number] Date of Birth/Sex: Treating RN: 23-Feb-1986 (37 y.o. Yates Decamp Primary Care Gaberial Cada: [name] Other  Clinician: Referring Ariana Juul: Treating Matthew Cina/Extender: [name] in Treatment: 152 Wound Status Wound Number: 15 Primary Etiology: Diabetic Wound/Ulcer of the Lower Extremity Wound Location: Right, Dorsal Ankle Wound Status: Open Wounding Event: Gradually Appeared Comorbid History: Type II Diabetes Date Acquired: 09/13/2022 Weeks Of Treatment: 0 Clustered Wound: No Photos Wound Measurements Length: (cm) 1.2 Width: (cm) 2.2 Depth: (cm) 0.5 Area: (cm) 2.073 Volume: (cm) 1.037 % Reduction in Area: % Reduction in Volume: Tunneling: No Undermining: Yes Starting Position (o'clock): 11 Ending Position (o'clock): 12 Maximum Distance: (cm) 3.2 Wound Description Classification: Grade 2 Exudate Amount: Medium Exudate Type: Serosanguineous Exudate Color: red, brown Foul Odor After Cleansing: No Slough/Fibrino No Wound Bed [place], [name] ([id number]) [id number] Page 8 of 10 Granulation Amount: Medium (34-66%) Exposed Structure Granulation Quality: Red, Pink Tendon Exposed: Yes Periwound Skin Texture Texture Color No Abnormalities Noted: No No Abnormalities Noted: No Scarring: Yes Hemosiderin Staining: Yes Moisture No Abnormalities  Noted: No Treatment Notes Wound #15 (Ankle) Wound Laterality: Dorsal, Right Cleanser Peri-Wound Care Topical Primary Dressing Secondary Dressing Secured With Compression Wrap Compression Stockings Add-Ons Electronic Signature(s) Signed: 09/29/2022 12:38:14 PM By: Brenton Grills Entered By: Brenton Grills on 09/28/2022 16:00:54 -------------------------------------------------------------------------------- Wound Assessment Details Patient Name: Date of Service: [name] 09/28/2022 3:30 PM Medical Record Number: [id number] Patient Account Number: [id number] Date of Birth/Sex: Treating RN: January 22, 1986 (37 y.o. Yates Decamp Primary Care Dylynn Ketner: [name] Other Clinician: Referring Milliana Reddoch: Treating Magalene Mclear/Extender: [name] in Treatment: 152 Wound Status Wound Number: 3 Primary Etiology: Diabetic Wound/Ulcer of the Lower Extremity Wound Location: Left, Lateral, Plantar Foot Wound Status: Open Wounding Event: Trauma Comorbid History: Type II Diabetes Date Acquired: 10/02/2019 Weeks Of Treatment: 152 Clustered Wound: No Photos Wound Measurements [place], [name] ([id number]) Length: (cm) 2.3 Width: (cm) 2.7 Depth: (cm) 1.8 Area: (cm) 4.877 Volume: (cm) 8.779 [id number] Page 9 of 10 % Reduction in Area: -195.8% % Reduction in Volume: -5220.6% Epithelialization: Small (1-33%) Tunneling: No Undermining: No Wound Description Classification: Grade 3 Wound Margin: Distinct, outline attached Exudate Amount: Medium Exudate Type: Serosanguineous Exudate Color: red, brown Foul Odor After Cleansing: No Slough/Fibrino No Wound Bed Granulation Amount: Large (67-100%) Exposed Structure Granulation Quality: Red, Pink Fascia Exposed: No Necrotic Amount: Small (1-33%) Fat Layer (Subcutaneous Tissue) Exposed: Yes Tendon Exposed: No Muscle Exposed: No Joint Exposed: No Bone Exposed: Yes Periwound  Skin Texture Texture Color No Abnormalities Noted: No No Abnormalities Noted: Yes Callus: Yes Temperature / Pain Temperature: No Abnormality Moisture No Abnormalities Noted: No Dry / Scaly: No Maceration: No Treatment Notes Wound #3 (Foot) Wound Laterality: Plantar, Left, Lateral Cleanser Soap and Water Discharge Instruction: May shower and wash wound with dial antibacterial soap and water prior to dressing change. Vashe 5.8 (oz) Discharge Instruction: Cleanse the wound with Vashe prior to applying a clean dressing, let sit on wound for 5-10 minutesusing gauze sponges, not tissue or cotton balls. Peri-Wound Care Skin Prep Discharge Instruction: Use skin prep as directed Topical Primary Dressing Dakin's Solution 0.25%, 16 (oz) Discharge Instruction: Moisten gauze with Dakin's solution Secondary Dressing Woven Gauze Sponge, Non-Sterile 4x4 in Discharge Instruction: Apply over primary dressing as directed. Zetuvit Plus 4x8 in Discharge Instruction: Apply over primary dressing as directed. Zetuvit Absorbent Pad, 4x4 (in/in) Secured With Elastic Bandage 4 inch (ACE bandage) Discharge Instruction: Secure with ACE bandage as directed. 81M Medipore Soft Cloth Surgical T 2x10 (in/yd) ape Discharge Instruction: Secure with tape as directed. Compression Wrap Kerlix Roll 4.5x3.1 (in/yd) Discharge Instruction: Apply  Kerlix and Coban compression as directed. Compression Stockings Add-Ons [place], [name] ([id number]) [id number] Page 10 of 10 Electronic Signature(s) Signed: 09/29/2022 12:38:14 PM By: Brenton Grills Entered By: Brenton Grills on 09/28/2022 16:04:03 -------------------------------------------------------------------------------- Vitals Details Patient Name: Date of Service: [name], [name] 09/28/2022 3:30 PM Medical Record Number: [id number] Patient Account Number: [id number] Date of Birth/Sex: Treating RN: 08/04/1985 (37 y.o. Yates Decamp Primary Care Alyscia Carmon: [name] Other Clinician: Referring Nahlia Hellmann: Treating Shahzad Thomann/Extender: [name] in Treatment: 152 Vital Signs Time Taken: 15:44 Temperature (F): 99.6 Height (in): 77 Pulse (bpm): 106 Weight (lbs): 280 Respiratory Rate (breaths/min): 18 Body Mass Index (BMI): 33.2 Blood Pressure (mmHg): 153/85 Capillary Blood Glucose (mg/dl): 782 Reference Range: 80 - 120 mg / dl Electronic Signature(s) Signed: 09/29/2022 12:38:14 PM By: Brenton Grills Entered By: Brenton Grills on 09/28/2022 15:46:35

## 2022-10-03 ENCOUNTER — Encounter (HOSPITAL_BASED_OUTPATIENT_CLINIC_OR_DEPARTMENT_OTHER): Payer: BC Managed Care – PPO | Attending: General Surgery | Admitting: General Surgery

## 2022-10-03 DIAGNOSIS — L97524 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one: Secondary | ICD-10-CM | POA: Insufficient documentation

## 2022-10-03 DIAGNOSIS — L97315 Non-pressure chronic ulcer of right ankle with muscle involvement without evidence of necrosis: Secondary | ICD-10-CM | POA: Diagnosis not present

## 2022-10-03 DIAGNOSIS — L97512 Non-pressure chronic ulcer of other part of right foot with fat layer exposed: Secondary | ICD-10-CM | POA: Diagnosis not present

## 2022-10-03 DIAGNOSIS — M86172 Other acute osteomyelitis, left ankle and foot: Secondary | ICD-10-CM | POA: Diagnosis not present

## 2022-10-03 DIAGNOSIS — E11622 Type 2 diabetes mellitus with other skin ulcer: Secondary | ICD-10-CM | POA: Diagnosis not present

## 2022-10-03 DIAGNOSIS — E11621 Type 2 diabetes mellitus with foot ulcer: Secondary | ICD-10-CM | POA: Diagnosis not present

## 2022-10-03 LAB — GLUCOSE, CAPILLARY
Glucose-Capillary: 129 mg/dL — ABNORMAL HIGH (ref 70–99)
Glucose-Capillary: 119 mg/dL — ABNORMAL HIGH (ref 70–99)

## 2022-10-03 NOTE — Progress Notes (Signed)
[place], [name] ([id number]) [id number] Page 1 of 1 Visit Report for 10/03/2022 SuperBill Details Patient Name: Date of Service: [name] 10/03/2022 Medical Record Number: [id number] Patient Account Number: [id number] Date of Birth/Sex: Treating RN: January 31, 1986 (37 y.o. 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

## 2022-10-03 NOTE — Progress Notes (Addendum)
[place], [name] ([id number]) [id number] Page 1 of 4 Visit Report for 10/03/2022 Arrival Information Details Patient Name: Date of Service: [name] 10/03/2022 10:00 A M Medical Record Number: [id number] Patient Account Number: [id number] Date of Birth/Sex: Treating RN: 1986-03-10 (37 y.o. [name] Primary Care [name]: [name] Other Clinician: Karl [name] Referring [name]: Treating [name] in Treatment: 153 Visit Information History Since Last Visit All ordered tests and consults were completed: Yes Patient Arrived: Ambulatory Added or deleted any medications: No Arrival Time: 09:31 Any new allergies or adverse reactions: No Accompanied By: self Had a fall or experienced change in No Transfer Assistance: None activities of daily living that may affect Patient Identification Verified: Yes risk of falls: Secondary Verification Process Completed: Yes Signs or symptoms of abuse/neglect since last visito No Patient Requires Transmission-Based Precautions: No Hospitalized since last visit: No Patient Has Alerts: No Implantable device outside of the clinic excluding No cellular tissue based products placed in the center since last visit: Pain Present Now: No Electronic Signature(s) Signed: 10/03/2022 3:31:49 PM By: [name] CHT EMT BS , , Entered By: [name] on 10/03/2022 15:31:49 -------------------------------------------------------------------------------- Clinic Level of Care Assessment Details Patient Name: Date of Service: [name] 10/03/2022 10:00 A M Medical Record Number: [id number] Patient Account Number: [id number] Date of Birth/Sex: Treating RN: 08-06-1985 (37 y.o. [name] Primary Care Haelee Bolen: [name] Other Clinician: Haywood [name] Referring [name]: Treating Naturi Alarid/Extender: [name] in Treatment: 153 Clinic Level of Care Assessment Items TOOL 4 Quantity Score []  - 0 Use when only an EandM is performed on FOLLOW-UP visit ASSESSMENTS - Nursing Assessment / Reassessment []  - 0 Reassessment of Co-morbidities (includes updates in patient status) []  - 0 Reassessment of Adherence to Treatment Plan ASSESSMENTS - Wound and Skin A ssessment / Reassessment []  - 0 Simple Wound Assessment / Reassessment - one wound []  - 0 Complex Wound Assessment / Reassessment - multiple wounds []  - 0 Dermatologic / Skin Assessment (not related to wound area) ASSESSMENTS - Focused Assessment []  - 0 Circumferential Edema Measurements - multi extremities []  - 0 Nutritional Assessment / Counseling / Intervention [place], [name] ([id number]) [id number] Page 2 of 4 []  - 0 Lower Extremity Assessment (monofilament, tuning fork, pulses) []  - 0 Peripheral Arterial Disease Assessment (using hand held doppler) ASSESSMENTS - Ostomy and/or Continence Assessment and Care []  - 0 Incontinence Assessment and Management []  - 0 Ostomy Care Assessment and Management (repouching, etc.) PROCESS - Coordination of Care []  - 0 Simple Patient / Family Education for ongoing care []  - 0 Complex (extensive) Patient / Family Education for ongoing care []  - 0 Staff obtains Chiropractor, Records, T Results / Process Orders est []  - 0 Staff telephones HHA, Nursing Homes / Clarify orders / etc []  - 0 Routine Transfer to another Facility (non-emergent condition) []  - 0 Routine Hospital Admission (non-emergent condition) []  - 0 New Admissions / Manufacturing engineer / Ordering NPWT Apligraf, etc. , []  - 0 Emergency Hospital Admission (emergent condition) []  - 0 Simple Discharge Coordination []  - 0 Complex (extensive) Discharge Coordination PROCESS - Special Needs []  - 0 Pediatric / Minor Patient Management []  - 0 Isolation Patient Management []  - 0 Hearing /  Language / Visual special needs []  - 0 Assessment of Community assistance (transportation, [name]/C planning, etc.) []  - 0 Additional assistance / Altered mentation []  - 0 Support Surface(s) Assessment (bed, cushion, seat, etc.) INTERVENTIONS - Wound  Cleansing / Measurement []  - 0 Simple Wound Cleansing - one wound []  - 0 Complex Wound Cleansing - multiple wounds []  - 0 Wound Imaging (photographs - any number of wounds) []  - 0 Wound Tracing (instead of photographs) []  - 0 Simple Wound Measurement - one wound []  - 0 Complex Wound Measurement - multiple wounds INTERVENTIONS - Wound Dressings []  - 0 Small Wound Dressing one or multiple wounds []  - 0 Medium Wound Dressing one or multiple wounds []  - 0 Large Wound Dressing one or multiple wounds []  - 0 Application of Medications - topical []  - 0 Application of Medications - injection INTERVENTIONS - Miscellaneous X- 1 5 External ear exam X- 1 5 Specimen Collection (cultures, biopsies, blood, body fluids, etc.) []  - 0 Specimen(s) / Culture(s) sent or taken to Lab for analysis []  - 0 Patient Transfer (multiple staff / Nurse, adult / Similar devices) []  - 0 Simple Staple / Suture removal (25 or less) []  - 0 Complex Staple / Suture removal (26 or more) []  - 0 Hypo / Hyperglycemic Management (close monitor of Blood Glucose) [place], [name] ([id number]) [id number] Page 3 of 4 []  - 0 Ankle / Brachial Index (ABI) - do not check if billed separately X- 1 5 Vital Signs Has the patient been seen at the hospital within the last three years: Yes Total Score: 15 Level Of Care: New/Established - Level 1 Electronic Signature(s) Signed: 10/03/2022 4:41:53 PM By: [name] CHT EMT BS , , Entered By: [name] on 10/03/2022 15:43:04 -------------------------------------------------------------------------------- Encounter Discharge Information Details Patient Name: Date of Service: [name],  [name] 10/03/2022 10:00 A M Medical Record Number: [id number] Patient Account Number: [id number] Date of Birth/Sex: Treating RN: 26-Dec-1985 (37 y.o. [name] Primary Care Karthika Glasper: [name] Other Clinician: Haywood [name] Referring Jlee Harkless: Treating Jobina Maita/Extender: [name] in Treatment: [phone] Encounter Discharge Information Items Discharge Condition: Stable Ambulatory Status: Ambulatory Discharge Destination: Home Transportation: Private Auto Accompanied By: self Schedule Follow-up Appointment: No Clinical Summary of Care: Electronic Signature(s) Signed: 10/03/2022 3:43:44 PM By: [name] CHT EMT BS , , Entered By: [name] on 10/03/2022 15:43:44 -------------------------------------------------------------------------------- General Visit Notes Details Patient Name: Date of Service: [name], [name] 10/03/2022 10:00 A M Medical Record Number: [id number] Patient Account Number: [id number] Date of Birth/Sex: Treating RN: January 14, 1986 (37 y.o. [name] Primary Care Saida Lonon: [name] Other Clinician: Haywood [name] Referring Annessa Satre: Treating Apolo Cutshaw/Extender: [name] in Treatment: 81 Notes [name] arrived with blood glucose level of 129 mg/dL at 0347. He was given an 8 oz Glucerna [id number]. At 1036 his blood glucose was re-measured at 119 mg/dL. Other vital signs were with in normal range. Patient had not eaten anything prior to arrival. Treatment was rescheduled with education to eat breakfast for the next treatment. Electronic Signature(s) Signed: 10/03/2022 3:38:40 PM By: [name] CHT EMT BS , , Entered By: [name] on 10/03/2022 15:38:40 [place], [name] ([id number]) [id number] Page 4 of 4 -------------------------------------------------------------------------------- Vitals Details Patient Name: Date of Service: [name] 10/03/2022 10:00 A M Medical Record Number: [id number] Patient Account Number: [id number] Date of Birth/Sex: Treating RN: 11-May-1985 (37 y.o. [name] Primary Care Stephane Junkins: [name] Other Clinician: Karl [name] Referring Mingo Siegert: Treating Aydenn Gervin/Extender: [name] in Treatment: 153 Vital Signs Time Taken: 09:57 Temperature (F): 97.1 Height (in): 77 Pulse (bpm): 83 Weight (lbs): 280 Respiratory Rate (breaths/min): 18 Body Mass Index (BMI): 33.2  Blood Pressure (mmHg): 142/76 Capillary Blood Glucose (mg/dl): 409 Reference Range: 80 - 120 mg / dl Electronic Signature(s) Signed: 10/03/2022 3:35:51 PM By: [name] CHT EMT BS , , Entered By: [name] on 10/03/2022 15:35:51

## 2022-10-04 ENCOUNTER — Encounter (HOSPITAL_BASED_OUTPATIENT_CLINIC_OR_DEPARTMENT_OTHER): Payer: BC Managed Care – PPO | Admitting: General Surgery

## 2022-10-04 ENCOUNTER — Encounter: Payer: BC Managed Care – PPO | Admitting: Podiatry

## 2022-10-04 DIAGNOSIS — E11621 Type 2 diabetes mellitus with foot ulcer: Secondary | ICD-10-CM | POA: Diagnosis not present

## 2022-10-04 LAB — GLUCOSE, CAPILLARY
Glucose-Capillary: 154 mg/dL — ABNORMAL HIGH (ref 70–99)
Glucose-Capillary: 131 mg/dL — ABNORMAL HIGH (ref 70–99)

## 2022-10-04 NOTE — Progress Notes (Addendum)
[place], [name] ([id number]) [id number] Page 1 of 2 Visit Report for 10/04/2022 HBO Details Patient Name: Date of Service: [name] D 10/04/2022 7:30 A M Medical Record Number: [id number] Patient Account Number: [id number] Date of Birth/Sex: Treating RN: 1985-05-05 (37 y.o. [name], [name] Primary Care Damon Baisch: [name] Other Clinician: [name] Referring Bennie Chirico: Treating Keari Miu/Extender: [name] in Treatment: 153 HBO Treatment Course Details Treatment Course Number: 2 Ordering Sahiti Joswick: Duanne Guess T Treatments Ordered: otal 40 HBO Treatment Start Date: 10/04/2022 HBO Indication: Diabetic Ulcer(s) of the Lower Extremity Wound #3 Left, Lateral, Plantar Foot HBO Treatment Details Treatment Number: 1 Patient Type: Outpatient Chamber Type: Monoplace Chamber Serial #: [id number] Treatment Protocol: 2.0 ATA with 90 minutes oxygen, and no air breaks Treatment Details Compression Rate Down: 1.0 psi / minute De-Compression Rate Up: 1.0 psi / minute Air breaks and breathing Decompress Decompress Compress Tx Pressure Begins Reached periods Begins Ends (leave unused spaces blank) Chamber Pressure (ATA 1 2 ------2 1 ) Clock Time (24 hr) 08:32 08:46 - - - - - - 10:17 10:31 Treatment Length: 119 (minutes) Treatment Segments: 4 Vital Signs Capillary Blood Glucose Reference Range: 80 - 120 mg / dl HBO Diabetic Blood Glucose Intervention Range: <131 mg/dl or >474 mg/dl Time Vitals Blood Respiratory Capillary Blood Glucose Pulse Action Type: Pulse: Temperature: Taken: Pressure: Rate: Glucose (mg/dl): Meter #: Oximetry (%) Taken: Pre 07:56 139/85 84 18 97.1 154 Post 10:36 125/93 86 18 97.3 131 Treatment Response Treatment Toleration: Well Treatment Completion Status: Treatment Completed without Adverse Event Treatment Notes Today was the patient first treatment session. The patient was seen by Dr.  Lady Gary before the treatment was started. No problems noted during the treatment session Additional Procedure Documentation Tissue Sevierity: Necrosis of bone Electronic Signature(s) Signed: 10/04/2022 12:28:56 PM By: Duanne Guess MD FACS Previous Signature: 10/04/2022 12:15:15 PM Version By: [name] EMT Entered By: Duanne Guess on 10/04/2022 12:28:56 [place], [name] ([id number]) [id number] Page 2 of 2 -------------------------------------------------------------------------------- HBO Safety Checklist Details Patient Name: Date of Service: [name] D 10/04/2022 7:30 A M Medical Record Number: [id number] Patient Account Number: [id number] Date of Birth/Sex: Treating RN: November 27, 1985 (37 y.o. [name], [name] Primary Care Kenneth Cuaresma: [name] Other Clinician: [name] Referring Edy Mcbane: Treating Lynnsie Linders/Extender: [name] in Treatment: 153 HBO Safety Checklist Items Safety Checklist Consent Form Signed Patient voided / foley secured and emptied When did you last eato 0704 Last dose of injectable or oral agent Yesterday Ostomy pouch emptied and vented if applicable NA All implantable devices assessed, documented and approved NA Intravenous access site secured and place NA Valuables secured Linens and cotton and cotton/polyester blend (less than 51% polyester) Personal oil-based products / skin lotions / body lotions removed Wigs or hairpieces removed NA Smoking or tobacco materials removed Books / newspapers / magazines / loose paper removed Cologne, aftershave, perfume and deodorant removed Jewelry removed (may wrap wedding band) Make-up removed NA Hair care products removed Battery operated devices (external) removed Libre3 Heating patches and chemical warmers removed Titanium eyewear removed NA Nail polish cured greater than 10 hours NA Casting material cured greater than 10  hours NA Hearing aids removed NA Loose dentures or partials removed NA Prosthetics have been removed NA Patient demonstrates correct use of air break device (if applicable) Patient concerns have been addressed Patient grounding bracelet on and cord attached to chamber Specifics for Inpatients (complete in addition to above) Medication sheet sent with patient NA Intravenous  medications needed or due during therapy sent with patient NA Drainage tubes (e.g. nasogastric tube or chest tube secured and vented) NA Endotracheal or Tracheotomy tube secured NA Cuff deflated of air and inflated with saline NA Airway suctioned NA Notes The safety checklist was done before the treatment started. Electronic Signature(s) Signed: 10/04/2022 12:07:11 PM By: [name] EMT Entered By: [name] on 10/04/2022 12:07:10

## 2022-10-04 NOTE — Progress Notes (Signed)
[place], [name] ([id number]) [id number] Page 1 of 3 Visit Report for 10/04/2022 Problem List Details Patient Name: Date of Service: [name] 10/04/2022 7:30 A M Medical Record Number: [id number] Patient Account Number: [id number] Date of Birth/Sex: Treating RN: Nov 08, 1985 (37 y.o. 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 09/28/2022 No Yes without evidence of necrosis L97.522 Non-pressure chronic ulcer of other part of left foot with fat layer 09/07/2022 No Yes exposed E11.621 Type 2 diabetes mellitus with foot ulcer 10/24/2019 No Yes E11.622 Type 2 diabetes mellitus with other skin ulcer 09/28/2022 No Yes Inactive Problems ICD-10 Code Description Active Date Inactive Date L97.518 Non-pressure chronic ulcer of other part of right foot with other specified 07/14/2020 07/14/2020 severity L97.518 Non-pressure chronic ulcer of other part of right foot with other specified 10/24/2019 10/24/2019 severity 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, right ankle and foot 10/24/2019 10/24/2019 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 08/10/2020 08/10/2020 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, left ankle and foot 10/24/2019 10/24/2019 L97.322 Non-pressure chronic ulcer of left ankle with fat layer exposed 09/29/2021 09/29/2021 [name], [name] ([id number]) [id number] Page 2 of 3 B95.62 Methicillin resistant Staphylococcus aureus infection as  the cause of diseases 10/24/2019 10/24/2019 classified elsewhere Resolved Problems ICD-10 Code Description Active Date Resolved Date L89.892 Pressure ulcer of other site, stage 2 07/27/2022 07/27/2022 Electronic Signature(s) Signed: 10/04/2022 12:16:22 PM By: [name] EMT Signed: 10/04/2022 12:28:07 PM By: Max Guess MD FACS Entered By: [name] on 10/04/2022 12:16:22 -------------------------------------------------------------------------------- SuperBill Details Patient Name: Date of Service: [name], [name] 10/04/2022 Medical Record Number: [id number] Patient Account Number: [id number] Date of Birth/Sex: Treating RN: December 14, 1985 (37 y.o. 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 without evidence of necrosis L97.522 Non-pressure chronic ulcer of other part of left foot with fat layer exposed E11.621 Type 2 diabetes mellitus with foot ulcer E11.622 Type 2 diabetes mellitus with other skin ulcer Facility Procedures : CPT4 Code Description: [id number] G0277-(Facility Use Only) HBOT full body chamber, , ICD-10 Diagnosis Description 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 M86.172 Other acute osteomyelitis, left ankle and foot  L97.522 Non-pressure chronic ulcer of other part of left foot with fat layer exp 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 withou Modifier: one osed t evidence of nec Quantity: 4 rosis Physician Procedures : CPT4 Code Description Modifier [id number] - WC PHYS HYPERBARIC  OXYGEN THERAPY ICD-10 Diagnosis Description 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ne M86.172 Other acute osteomyelitis, left ankle and foot  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

## 2022-10-04 NOTE — Progress Notes (Addendum)
[place], [name] ([id number]) [id number] Page 1 of 2 Visit Report for 10/04/2022 Arrival Information Details Patient Name: Date of Service: [name] 10/04/2022 7:30 A M Medical Record Number: [id number] Patient Account Number: [id number] Date of Birth/Sex: Treating RN: 03-18-1986 (37 y.o. [name], [name] Primary Care Anakaren Campion: [name] Other Clinician: [name] Referring Classie Weng: Treating Enrica Corliss/Extender: [name] in Treatment: 153 Visit Information History Since Last Visit All ordered tests and consults were completed: Yes Patient Arrived: Ambulatory Added or deleted any medications: No Arrival Time: 07:32 Any new allergies or adverse reactions: No Accompanied By: None Had a fall or experienced change in No Transfer Assistance: None activities of daily living that may affect Patient Identification Verified: Yes risk of falls: Secondary Verification Process Completed: Yes Signs or symptoms of abuse/neglect since last visito No Patient Requires Transmission-Based Precautions: No Hospitalized since last visit: No Patient Has Alerts: No Implantable device outside of the clinic excluding No cellular tissue based products placed in the center since last visit: Pain Present Now: No Electronic Signature(s) Signed: 10/04/2022 12:04:53 PM By: [name] EMT Entered By: [name] on 10/04/2022 12:04:53 -------------------------------------------------------------------------------- Encounter Discharge Information Details Patient Name: Date of Service: [name], [name] 10/04/2022 7:30 A M Medical Record Number: [id number] Patient Account Number: [id number] Date of Birth/Sex: Treating RN: 02/11/1986 (37 y.o. [name] Primary Care Isa Hitz: [name] Other Clinician: [name] Referring Sherman Lipuma: Treating Vaani Morren/Extender: [name] in Treatment:  [id number] Encounter Discharge Information Items Discharge Condition: Stable Ambulatory Status: Ambulatory Discharge Destination: Home Transportation: Private Auto Accompanied By: None Schedule Follow-up Appointment: Yes Clinical Summary of Care: Electronic Signature(s) Signed: 10/04/2022 12:17:52 PM By: [name] EMT Entered By: [name] on 10/04/2022 12:17:51 [place], [name] ([id number]) [id number] Page 2 of 2 -------------------------------------------------------------------------------- Vitals Details Patient Name: Date of Service: [name] 10/04/2022 7:30 A M Medical Record Number: [id number] Patient Account Number: [id number] Date of Birth/Sex: Treating RN: 05-07-1985 (37 y.o. [name], [name] Primary Care Rashena Dowling: [name] Other Clinician: [name] Referring [name]: Treating Kele Barthelemy/Extender: [name] in Treatment: 153 Vital Signs Time Taken: 07:56 Temperature (F): 97.1 Height (in): 77 Pulse (bpm): 84 Weight (lbs): 280 Respiratory Rate (breaths/min): 18 Body Mass Index (BMI): 33.2 Blood Pressure (mmHg): 139/85 Capillary Blood Glucose (mg/dl): 295 Reference Range: 80 - 120 mg / dl Electronic Signature(s) Signed: 10/04/2022 12:05:28 PM By: [name] EMT Entered By: [name] on 10/04/2022 12:05:27

## 2022-10-04 NOTE — Progress Notes (Signed)
[place], [name] ([id number]) [id number] Page 1 of 12 Visit Report for 10/04/2022 Arrival Information Details Patient Name: Date of Service: [name] 10/04/2022 12:30 PM Medical Record Number: [id number] Patient Account Number: [id number] Date of Birth/Sex: Treating RN: 1985-09-20 (37 y.o. Cline Cools Primary Care Drevin Ortner: [name] Other Clinician: Referring Doyt Castellana: Treating Khoury Siemon/Extender: [name] in Treatment: 153 Visit Information History Since Last Visit Added or deleted any medications: No Patient Arrived: Ambulatory Any new allergies or adverse reactions: No Arrival Time: 12:39 Had Max fall or experienced change in No Accompanied By: self activities of daily living that may affect Transfer Assistance: None risk of falls: Patient Identification Verified: Yes Signs or symptoms of abuse/neglect since last visito No Secondary Verification Process Completed: Yes Hospitalized since last visit: No Patient Requires Transmission-Based Precautions: No Implantable device outside of the clinic excluding No Patient Has Alerts: No cellular tissue based products placed in the center since last visit: Has Dressing in Place as Prescribed: Yes Pain Present Now: No Electronic Signature(s) Signed: 10/04/2022 4:25:24 PM By: Redmond Pulling RN, BSN Entered By: Redmond Pulling on 10/04/2022 12:42:02 -------------------------------------------------------------------------------- Encounter Discharge Information Details Patient Name: Date of Service: [name], [name] 10/04/2022 12:30 PM Medical Record Number: [id number] Patient Account Number: [id number] Date of Birth/Sex: Treating RN: Nov 16, 1985 (37 y.o. Cline Cools Primary Care Nelson Noone: [name] Other Clinician: Referring Lanisha Stepanian: Treating Omesha Bowerman/Extender: [name] in Treatment: [id number] Encounter Discharge Information Items  Post Procedure Vitals Discharge Condition: Stable Temperature (F): 97.9 Ambulatory Status: Ambulatory Pulse (bpm): 98 Discharge Destination: Home Respiratory Rate (breaths/min): 18 Transportation: Private Auto Blood Pressure (mmHg): 131/85 Accompanied By: self Schedule Follow-up Appointment: Yes Clinical Summary of Care: Patient Declined Electronic Signature(s) Signed: 10/04/2022 4:25:24 PM By: Redmond Pulling RN, BSN Entered By: Redmond Pulling on 10/04/2022 14:38:45 [place], [name] ([id number]) [id number] Page 2 of 12 -------------------------------------------------------------------------------- Lower Extremity Assessment Details Patient Name: Date of Service: [name] 10/04/2022 12:30 PM Medical Record Number: [id number] Patient Account Number: [id number] Date of Birth/Sex: Treating RN: 12-Apr-1985 (37 y.o. Cline Cools Primary Care Phiona Ramnauth: [name] Other Clinician: Referring Azim Gillingham: Treating Savian Mazon/Extender: [name] in Treatment: 153 Edema Assessment Assessed: [name]: No] Franne Forts: No] Edema: [Left: Ye] [Right: s] Calf Left: Right: Point of Measurement: 48 cm From Medial Instep 49 cm 47.2 cm Ankle Left: Right: Point of Measurement: 11 cm From Medial Instep 30 cm 29.5 cm Electronic Signature(s) Signed: 10/04/2022 4:25:24 PM By: Redmond Pulling RN, BSN Entered By: Redmond Pulling on 10/04/2022 12:44:15 -------------------------------------------------------------------------------- Multi Wound Chart Details Patient Name: Date of Service: [name], [name] 10/04/2022 12:30 PM Medical Record Number: [id number] Patient Account Number: [id number] Date of Birth/Sex: Treating RN: March 22, 1986 (37 y.o. M) Primary Care Mariyah Upshaw: [name] Other Clinician: Referring Libero Puthoff: Treating Ailie Gage/Extender: [name] in Treatment: 153 Vital Signs Height(in): 77 Pulse(bpm):  98 Weight(lbs): 280 Blood Pressure(mmHg): 131/85 Body Mass Index(BMI): 33.2 Temperature(F): 97.9 Respiratory Rate(breaths/min): 18 [14:Photos:] Left, Dorsal Foot Right, Dorsal Ankle Right, Medial Foot Wound Location: Pressure Injury Gradually Appeared Gradually Appeared Wounding Event: Diabetic Wound/Ulcer of the Lower Diabetic Wound/Ulcer of the Lower Diabetic Wound/Ulcer of the Lower Primary Etiology: Extremity Extremity Extremity Type II Diabetes Type II Diabetes Type II Diabetes Comorbid History: 09/05/2022 09/13/2022 10/04/2022 Date Acquired: [place], [name] ([id number]) [id number] Page 3 of 12 3 0 0 Weeks of Treatment: Healed - Epithelialized Open Open Wound Status: No No No Wound Recurrence: 0x0x0 1.7x2x0.5 0.5x0.8x0.1  Measurements L x W x [name] 0 2.67 0.314 Max (cm) : rea 0 1.335 0.031 Volume (cm) : 100.00% -28.80% N/Max % Reduction in Max rea: 100.00% -28.70% N/Max % Reduction in Volume: 7 Starting Position 1 (o'clock): 11 Ending Position 1 (o'clock): 2 Maximum Distance 1 (cm): No Yes No Undermining: Grade 1 Grade 2 Grade 2 Classification: None Present Medium Medium Exudate Max mount: N/Max Serosanguineous Serosanguineous Exudate Type: N/Max red, brown red, brown Exudate Color: Flat and Intact N/Max N/Max Wound Margin: None Present (0%) Medium (34-66%) Large (67-100%) Granulation Max mount: N/Max Red, Pink Red Granulation Quality: Small (1-33%) Small (1-33%) Small (1-33%) Necrotic Max mount: Fascia: No Tendon: Yes Fat Layer (Subcutaneous Tissue): Yes Exposed Structures: Fat Layer (Subcutaneous Tissue): No Fascia: No Tendon: No Tendon: No Muscle: No Muscle: No Joint: No Joint: No Bone: No Bone: No Large (67-100%) N/Max None Epithelialization: N/Max N/Max Debridement - Selective/Open Wound Debridement: Pre-procedure Verification/Time Out N/Max N/Max 13:12 Taken: N/Max N/Max Callus, Slough Tissue Debrided: N/Max N/Max Non-Viable Tissue Level: N/Max  N/Max 0.31 Debridement Max (sq cm): rea N/Max N/Max Curette Instrument: N/Max N/Max Minimum Bleeding: N/Max N/Max Pressure Hemostasis Max chieved: N/Max N/Max Procedure was tolerated well Debridement Treatment Response: N/Max N/Max 0.5x0.8x0.1 Post Debridement Measurements L x W x [name] N/Max N/Max 0.031 Post Debridement Volume: (cm) Callus: Yes Scarring: Yes Periwound Skin Texture: Maceration: Yes Periwound Skin Moisture: No Abnormalities Noted Hemosiderin Staining: Yes Periwound Skin Color: No Abnormality N/Max N/Max Temperature: N/Max N/Max Debridement Procedures Performed: Wound Number: 3 N/Max N/Max Photos: N/Max N/Max Left, Lateral, Plantar Foot N/Max N/Max Wound Location: Trauma N/Max N/Max Wounding Event: Diabetic Wound/Ulcer of the Lower N/Max N/Max Primary Etiology: Extremity Type II Diabetes N/Max N/Max Comorbid History: 10/02/2019 N/Max N/Max Date Acquired: 153 N/Max N/Max Weeks of Treatment: Open N/Max N/Max Wound Status: No N/Max N/Max Wound Recurrence: 2.6x3.3x1.4 N/Max N/Max Measurements L x W x [name] 6.739 N/Max N/Max Max (cm) : rea 9.434 N/Max N/Max Volume (cm) : -308.70% N/Max N/Max % Reduction in Max rea: -5617.60% N/Max N/Max % Reduction in Volume: 6 Starting Position 1 (o'clock): 1 Ending Position 1 (o'clock): 1.2 Maximum Distance 1 (cm): Yes N/Max N/Max Undermining: Grade 3 N/Max N/Max Classification: Medium N/Max N/Max Exudate Max mount: Serosanguineous N/Max N/Max Exudate Type: red, brown N/Max N/Max Exudate Color: Distinct, outline attached N/Max N/Max Wound Margin: Large (67-100%) N/Max N/Max Granulation Max mount: Red, Pink N/Max N/Max Granulation Quality: Small (1-33%) N/Max N/Max Necrotic Max mount: Fat Layer (Subcutaneous Tissue): Yes N/Max N/Max Exposed Structures: Bone: Yes [place], [name] ([id number]) [id number] Page 4 of 12 Fascia: No Tendon: No Muscle: No Joint: No None N/Max N/Max Epithelialization: Debridement - Selective/Open Wound N/Max N/Max Debridement: Pre-procedure Verification/Time Out 13:12 N/Max  N/Max Taken: Callus, Slough N/Max N/Max Tissue Debrided: Skin/Epidermis N/Max N/Max Level: 13.42 N/Max N/Max Debridement Max (sq cm): rea Curette N/Max N/Max Instrument: Minimum N/Max N/Max Bleeding: Pressure N/Max N/Max Hemostasis Max chieved: Procedure was tolerated well N/Max N/Max Debridement Treatment Response: 3x5.7x1.4 N/Max N/Max Post Debridement Measurements L x W x [name] 18.802 N/Max N/Max Post Debridement Volume: (cm) Callus: Yes N/Max N/Max Periwound Skin Texture: Maceration: Yes N/Max N/Max Periwound Skin Moisture: Dry/Scaly: No No Abnormalities Noted N/Max N/Max Periwound Skin Color: No Abnormality N/Max N/Max Temperature: Debridement N/Max N/Max Procedures Performed: Treatment Notes Electronic Signature(s) Signed: 10/04/2022 2:02:43 PM By: Duanne Guess MD FACS Entered By: Duanne Guess on 10/04/2022 14:02:43 -------------------------------------------------------------------------------- Multi-Disciplinary Care Plan Details Patient Name: Date of Service: [name], [name] 10/04/2022 12:30  PM Medical Record Number: [id number] Patient Account Number: [id number] Date of Birth/Sex: Treating RN: Jun 01, 1985 (37 y.o. Cline Cools Primary Care Erminia Mcnew: [name] Other Clinician: Referring Jaqwan Wieber: Treating Vertis Scheib/Extender: [name] in Treatment: 153 Multidisciplinary Care Plan reviewed with physician Active Inactive Nutrition Nursing Diagnoses: Imbalanced nutrition Potential for alteratiion in Nutrition/Potential for imbalanced nutrition Goals: Patient/caregiver agrees to and verbalizes understanding of need to use nutritional supplements and/or vitamins as prescribed Date Initiated: 10/24/2019 Date Inactivated: 04/06/2020 Target Resolution Date: 04/03/2020 Goal Status: Met Patient/caregiver will maintain therapeutic glucose control Date Initiated: 10/24/2019 Target Resolution Date: 09/21/2022 Goal Status: Active Interventions: Assess HgA1c results as ordered upon  admission and as needed Assess patient nutrition upon admission and as needed per policy Provide education on elevated blood sugars and impact on wound healing Provide education on nutrition Treatment Activities: Education provided on Nutrition : 02/02/2022 Notes: [place], [name] ([id number]) [id number] Page 5 of 12 11/17/20: Glucose control ongoing issue, target date extended. 01/26/21: Glucose management continues. Wound/Skin Impairment Nursing Diagnoses: Impaired tissue integrity Knowledge deficit related to ulceration/compromised skin integrity Goals: Patient/caregiver will verbalize understanding of skin care regimen Date Initiated: 10/24/2019 Target Resolution Date: 09/21/2022 Goal Status: Active Ulcer/skin breakdown will have Max volume reduction of 30% by week 4 Date Initiated: 10/24/2019 Date Inactivated: 01/16/2020 Target Resolution Date: 01/10/2020 Unmet Reason: no change in Goal Status: Unmet measurements. Interventions: Assess patient/caregiver ability to obtain necessary supplies Assess patient/caregiver ability to perform ulcer/skin care regimen upon admission and as needed Assess ulceration(s) every visit Provide education on ulcer and skin care Notes: 11/17/20: Wound care regimen continues Electronic Signature(s) Signed: 10/04/2022 4:25:24 PM By: Redmond Pulling RN, BSN Entered By: Redmond Pulling on 10/04/2022 12:38:06 -------------------------------------------------------------------------------- Pain Assessment Details Patient Name: Date of Service: [name], [name] 10/04/2022 12:30 PM Medical Record Number: [id number] Patient Account Number: [id number] Date of Birth/Sex: Treating RN: [birth year] (37 y.o. Cline Cools Primary Care Yanilen Adamik: [name] Other Clinician: Referring Jakyra Kenealy: Treating Kadi Hession/Extender: [name] in Treatment: [phone] Active Problems Location of Pain Severity and Description of  Pain Patient Has Paino No Site Locations Pain Management and Medication Current Pain Management: Electronic Signature(s) [place], [name] ([id number]) [id number] Page 6 of 12 Signed: 10/04/2022 4:25:24 PM By: Redmond Pulling RN, BSN Entered By: Redmond Pulling on 10/04/2022 12:42:30 -------------------------------------------------------------------------------- Patient/Caregiver Education Details Patient Name: Date of Service: [name] 7/2/2024andnbsp12:30 PM Medical Record Number: [id number] Patient Account Number: [id number] Date of Birth/Gender: Treating RN: December 12, 1985 (37 y.o. Cline Cools Primary Care Physician: [name] Other Clinician: Referring Physician: Treating Physician/Extender: [name] in Treatment: [phone] Education Assessment Education Provided To: Patient Education Topics Provided Wound/Skin Impairment: Methods: Explain/Verbal Responses: State content correctly [employer]) Signed: 10/04/2022 4:25:24 PM By: Redmond Pulling RN, BSN Entered By: Redmond Pulling on 10/04/2022 14:37:49 -------------------------------------------------------------------------------- Wound Assessment Details Patient Name: Date of Service: [name], [name] 10/04/2022 12:30 PM Medical Record Number: [id number] Patient Account Number: [id number] Date of Birth/Sex: Treating RN: 1985-10-23 (37 y.o. Cline Cools Primary Care Waldon Sheerin: [name] Other Clinician: Referring Tela Kotecki: Treating Shivonne Schwartzman/Extender: [name] in Treatment: 153 Wound Status Wound Number: 14 Primary Etiology: Diabetic Wound/Ulcer of the Lower Extremity Wound Location: Left, Dorsal Foot Wound Status: Healed - Epithelialized Wounding Event: Pressure Injury Comorbid History: Type II Diabetes Date Acquired: 09/05/2022 Weeks Of Treatment: 3 Clustered Wound: No Photos [place], [name] ([id number])  [id number] Page 7 of 12 Wound Measurements Length: (cm) Width: (cm) Depth: (  cm) Area: (cm) Volume: (cm) 0 % Reduction in Area: 100% 0 % Reduction in Volume: 100% 0 Epithelialization: Large (67-100%) 0 Tunneling: No 0 Undermining: No Wound Description Classification: Grade 1 Wound Margin: Flat and Intact Exudate Amount: None Present Foul Odor After Cleansing: No Slough/Fibrino No Wound Bed Granulation Amount: None Present (0%) Exposed Structure Necrotic Amount: Small (1-33%) Fascia Exposed: No Necrotic Quality: Adherent Slough Fat Layer (Subcutaneous Tissue) Exposed: No Tendon Exposed: No Muscle Exposed: No Joint Exposed: No Bone Exposed: No Periwound Skin Texture Texture Color No Abnormalities Noted: Yes No Abnormalities Noted: Yes Moisture Temperature / Pain No Abnormalities Noted: Yes Temperature: No Abnormality Treatment Notes Wound #14 (Foot) Wound Laterality: Dorsal, Left Cleanser Peri-Wound Care Topical Primary Dressing Secondary Dressing Secured With Compression Wrap Compression Stockings Add-Ons Electronic Signature(s) Signed: 10/04/2022 4:25:24 PM By: Redmond Pulling RN, BSN Entered By: Redmond Pulling on 10/04/2022 13:23:20 -------------------------------------------------------------------------------- Wound Assessment Details Patient Name: Date of Service: [name], [name] 10/04/2022 12:30 PM Medical Record Number: [id number] Patient Account Number: [id number] Date of Birth/Sex: Treating RN: September 16, 1985 (37 y.o. Cline Cools Primary Care Kelso Bibby: [name] Other Clinician: Referring Zollie Ellery: Treating Miriam Liles/Extender: [name] in Treatment: 153 Wound Status Wound Number: 15 Primary Etiology: Diabetic Wound/Ulcer of the Lower Extremity Wound Location: Right, Dorsal Ankle Wound Status: Open Wounding Event: Gradually Appeared Comorbid History: Type II Diabetes Date Acquired:  09/13/2022 [place], [name] ([id number]) [id number] Page 8 of 12 Weeks Of Treatment: 0 Clustered Wound: No Photos Wound Measurements Length: (cm) 1.7 Width: (cm) 2 Depth: (cm) 0.5 Area: (cm) 2.67 Volume: (cm) 1.335 % Reduction in Area: -28.8% % Reduction in Volume: -28.7% Undermining: Yes Starting Position (o'clock): 7 Ending Position (o'clock): 11 Maximum Distance: (cm) 2 Wound Description Classification: Grade 2 Exudate Amount: Medium Exudate Type: Serosanguineous Exudate Color: red, brown Foul Odor After Cleansing: No Slough/Fibrino No Wound Bed Granulation Amount: Medium (34-66%) Exposed Structure Granulation Quality: Red, Pink Tendon Exposed: Yes Necrotic Amount: Small (1-33%) Necrotic Quality: Adherent Slough Periwound Skin Texture Texture Color No Abnormalities Noted: No No Abnormalities Noted: No Scarring: Yes Hemosiderin Staining: Yes Moisture No Abnormalities Noted: No Treatment Notes Wound #15 (Ankle) Wound Laterality: Dorsal, Right Cleanser Soap and Water Discharge Instruction: May shower and wash wound with dial antibacterial soap and water prior to dressing change. Vashe 5.8 (oz) Discharge Instruction: Cleanse the wound with Vashe prior to applying Max clean dressing, let sit on wound for 5-10 minutesusing gauze sponges, not tissue or cotton balls. Peri-Wound Care Skin Prep Discharge Instruction: Use skin prep as directed Topical Primary Dressing Promogran Prisma Matrix, 4.34 (sq in) (silver collagen) Discharge Instruction: Moisten collagen with saline or hydrogel Secondary Dressing Woven Gauze Sponge, Non-Sterile 4x4 in Discharge Instruction: Apply over primary dressing as directed. Zetuvit Plus 4x8 in Discharge Instruction: Apply over primary dressing as directed. Zetuvit Absorbent Pad, 4x4 (in/in) [place], [name] ([id number]) [id number] Page 9 of 12 Secured With [employer] 4x5  (in/yd) Discharge Instruction: Secure with Coban as directed. 11M Medipore H Soft Cloth Surgical T ape, 4 x 10 (in/yd) Discharge Instruction: Secure with tape as directed. Compression Wrap Kerlix Roll 4.5x3.1 (in/yd) Discharge Instruction: Apply Kerlix and Coban compression as directed. Compression Stockings Add-Ons Electronic Signature(s) Signed: 10/04/2022 4:25:24 PM By: Redmond Pulling RN, BSN Entered By: Redmond Pulling on 10/04/2022 13:00:42 -------------------------------------------------------------------------------- Wound Assessment Details Patient Name: Date of Service: [name], [name] 10/04/2022 12:30 PM Medical Record Number: [id number] Patient Account Number: [id number] Date of Birth/Sex: Treating RN: 02-Nov-1985 (37 y.o.  Cline Cools Primary Care Kashus Karlen: [name] Other Clinician: Referring Shervin Cypert: Treating Vernell Townley/Extender: [name] in Treatment: 153 Wound Status Wound Number: 16 Primary Etiology: Diabetic Wound/Ulcer of the Lower Extremity Wound Location: Right, Medial Foot Wound Status: Open Wounding Event: Gradually Appeared Comorbid History: Type II Diabetes Date Acquired: 10/04/2022 Weeks Of Treatment: 0 Clustered Wound: No Photos Wound Measurements Length: (cm) 0.5 Width: (cm) 0.8 Depth: (cm) 0.1 Area: (cm) 0.314 Volume: (cm) 0.031 % Reduction in Area: % Reduction in Volume: Epithelialization: None Tunneling: No Undermining: No Wound Description Classification: Grade 2 Exudate Amount: Medium Exudate Type: Serosanguineous Exudate Color: red, brown Foul Odor After Cleansing: No Slough/Fibrino Yes Wound Bed Granulation Amount: Large (67-100%) Exposed Structure Granulation Quality: Red Fascia Exposed: No [place], [name] ([id number]) [id number] Page 10 of 12 Necrotic Amount: Small (1-33%) Fat Layer (Subcutaneous Tissue) Exposed: Yes Necrotic Quality: Adherent Slough Tendon  Exposed: No Muscle Exposed: No Joint Exposed: No Bone Exposed: No Periwound Skin Texture Texture Color No Abnormalities Noted: No No Abnormalities Noted: No Moisture No Abnormalities Noted: No Treatment Notes Wound #16 (Foot) Wound Laterality: Right, Medial Cleanser Soap and Water Discharge Instruction: May shower and wash wound with dial antibacterial soap and water prior to dressing change. Vashe 5.8 (oz) Discharge Instruction: Cleanse the wound with Vashe prior to applying Max clean dressing, let sit on wound for 5-10 minutesusing gauze sponges, not tissue or cotton balls. Peri-Wound Care Skin Prep Discharge Instruction: Use skin prep as directed Topical Primary Dressing Promogran Prisma Matrix, 4.34 (sq in) (silver collagen) Discharge Instruction: Moisten collagen with saline or hydrogel Secondary Dressing Optifoam Non-Adhesive Dressing, 4x4 in Discharge Instruction: Apply over primary dressing as directed. Woven Gauze Sponge, Non-Sterile 4x4 in Discharge Instruction: Apply over primary dressing as directed. Secured With [employer] 4x5 (in/yd) Discharge Instruction: Secure with Coban as directed. 34M Medipore H Soft Cloth Surgical T ape, 4 x 10 (in/yd) Discharge Instruction: Secure with tape as directed. Compression Wrap Kerlix Roll 4.5x3.1 (in/yd) Discharge Instruction: Apply Kerlix and Coban compression as directed. Compression Stockings Add-Ons Electronic Signature(s) Signed: 10/04/2022 4:25:24 PM By: Redmond Pulling RN, BSN Entered By: Redmond Pulling on 10/04/2022 13:05:42 -------------------------------------------------------------------------------- Wound Assessment Details Patient Name: Date of Service: [name], [name] 10/04/2022 12:30 PM Medical Record Number: [id number] Patient Account Number: [id number] Date of Birth/Sex: Treating RN: 1986-01-11 (37 y.o. Cline Cools Primary Care Abeer Deskins: [name] Other Clinician: Referring  Ritchie Klee: Treating Lacresia Darwish/Extender: [name] in Treatment: [address] [name] ([id number]) [id number] Page 11 of 12 Wound Status Wound Number: 3 Primary Etiology: Diabetic Wound/Ulcer of the Lower Extremity Wound Location: Left, Lateral, Plantar Foot Wound Status: Open Wounding Event: Trauma Comorbid History: Type II Diabetes Date Acquired: 10/02/2019 Weeks Of Treatment: 153 Clustered Wound: No Photos Wound Measurements Length: (cm) 2.6 Width: (cm) 3.3 Depth: (cm) 1.4 Area: (cm) 6.739 Volume: (cm) 9.434 % Reduction in Area: -308.7% % Reduction in Volume: -5617.6% Epithelialization: None Tunneling: No Undermining: Yes Starting Position (o'clock): 6 Ending Position (o'clock): 1 Maximum Distance: (cm) 1.2 Wound Description Classification: Grade 3 Wound Margin: Distinct, outline attached Exudate Amount: Medium Exudate Type: Serosanguineous Exudate Color: red, brown Foul Odor After Cleansing: No Slough/Fibrino No Wound Bed Granulation Amount: Large (67-100%) Exposed Structure Granulation Quality: Red, Pink Fascia Exposed: No Necrotic Amount: Small (1-33%) Fat Layer (Subcutaneous Tissue) Exposed: Yes Necrotic Quality: Adherent Slough Tendon Exposed: No Muscle Exposed: No Joint Exposed: No Bone Exposed: Yes Periwound Skin Texture Texture Color No Abnormalities Noted: No No Abnormalities  Noted: Yes Callus: Yes Temperature / Pain Temperature: No Abnormality Moisture No Abnormalities Noted: No Dry / Scaly: No Maceration: Yes Treatment Notes Wound #3 (Foot) Wound Laterality: Plantar, Left, Lateral Cleanser Soap and Water Discharge Instruction: May shower and wash wound with dial antibacterial soap and water prior to dressing change. Vashe 5.8 (oz) Discharge Instruction: Cleanse the wound with Vashe prior to applying Max clean dressing, let sit on wound for 5-10 minutesusing gauze sponges, not tissue or  cotton balls. Peri-Wound Care Skin Prep Discharge Instruction: Use skin prep as directed [place], [name] ([id number]) [phone].pdf Page 12 of 12 Topical Primary Dressing Dakin's Solution 0.25%, 16 (oz) Discharge Instruction: Moisten gauze with Dakin's solution Secondary Dressing Woven Gauze Sponge, Non-Sterile 4x4 in Discharge Instruction: Apply over primary dressing as directed. Zetuvit Plus 4x8 in Discharge Instruction: Apply over primary dressing as directed. Zetuvit Absorbent Pad, 4x4 (in/in) Secured With [employer] Self-Adherent Wrap 4x5 (in/yd) Discharge Instruction: Secure with Coban as directed. 22M Medipore H Soft Cloth Surgical T ape, 4 x 10 (in/yd) Discharge Instruction: Secure with tape as directed. Compression Wrap Kerlix Roll 4.5x3.1 (in/yd) Discharge Instruction: Apply Kerlix and Coban compression as directed. Compression Stockings Add-Ons Electronic Signature(s) Signed: 10/04/2022 4:25:24 PM By: Redmond Pulling RN, BSN Entered By: Redmond Pulling on 10/04/2022 13:06:21 -------------------------------------------------------------------------------- Vitals Details Patient Name: Date of Service: [name], [name] 10/04/2022 12:30 PM Medical Record Number: [id number] Patient Account Number: [id number] Date of Birth/Sex: Treating RN: 1985/11/25 (37 y.o. Cline Cools Primary Care Kameelah Minish: [name] Other Clinician: Referring Pruitt Taboada: Treating Kaleea Penner/Extender: [name] in Treatment: 153 Vital Signs Time Taken: 12:42 Temperature (F): 97.9 Height (in): 77 Pulse (bpm): 98 Weight (lbs): 280 Respiratory Rate (breaths/min): 18 Body Mass Index (BMI): 33.2 Blood Pressure (mmHg): 131/85 Reference Range: 80 - 120 mg / dl Electronic Signature(s) Signed: 10/04/2022 4:25:24 PM By: Redmond Pulling RN, BSN Entered By: Redmond Pulling on 10/04/2022 12:42:23

## 2022-10-05 ENCOUNTER — Encounter (HOSPITAL_BASED_OUTPATIENT_CLINIC_OR_DEPARTMENT_OTHER): Payer: BC Managed Care – PPO | Admitting: General Surgery

## 2022-10-05 DIAGNOSIS — E11621 Type 2 diabetes mellitus with foot ulcer: Secondary | ICD-10-CM | POA: Diagnosis not present

## 2022-10-05 LAB — GLUCOSE, CAPILLARY
Glucose-Capillary: 146 mg/dL — ABNORMAL HIGH (ref 70–99)
Glucose-Capillary: 105 mg/dL — ABNORMAL HIGH (ref 70–99)

## 2022-10-05 NOTE — Progress Notes (Signed)
[place], [name] ([id number]) [id number] Page 1 of 2 Visit Report for 10/05/2022 HBO Details Patient Name: Date of Service: [name] D 10/05/2022 2:00 PM Medical Record Number: [id number] Patient Account Number: [id number] Date of Birth/Sex: Treating RN: April 11, 1985 (37 y.o. [name] Primary Care Luka Reisch: [name] Other Clinician: [name] Referring Henrik Orihuela: Treating Sharonann Malbrough/Extender: [name] in Treatment: 153 HBO Treatment Course Details Treatment Course Number: 2 Ordering Viney Acocella: Duanne Guess T Treatments Ordered: otal 40 HBO Treatment Start Date: 10/04/2022 HBO Indication: Diabetic Ulcer(s) of the Lower Extremity Wound #3 Left, Lateral, Plantar Foot HBO Treatment Details Treatment Number: 2 Patient Type: Outpatient Chamber Type: Monoplace Chamber Serial #: [id number] Treatment Protocol: 2.0 ATA with 90 minutes oxygen, and no air breaks Treatment Details Compression Rate Down: 1.0 psi / minute De-Compression Rate Up: 1.0 psi / minute Air breaks and breathing Decompress Decompress Compress Tx Pressure Begins Reached periods Begins Ends (leave unused spaces blank) Chamber Pressure (ATA 1 2 ------2 1 ) Clock Time (24 hr) 14:03 14:17 - - - - - - 15:47 15:59 Treatment Length: 116 (minutes) Treatment Segments: 4 Vital Signs Capillary Blood Glucose Reference Range: 80 - 120 mg / dl HBO Diabetic Blood Glucose Intervention Range: <131 mg/dl or >324 mg/dl Time Vitals Blood Respiratory Capillary Blood Glucose Pulse Action Type: Pulse: Temperature: Taken: Pressure: Rate: Glucose (mg/dl): Meter #: Oximetry (%) Taken: Pre 13:32 128/76 105 18 97.4 146 Post 16:06 135/93 77 18 98.1 105 Treatment Response Treatment Toleration: Well Treatment Completion Status: Treatment Completed without Adverse Event Treatment Notes Dr. Lady Gary informed of the patient heart rate. Additional Procedure  Documentation Tissue Sevierity: Necrosis of bone Physician HBO Attestation: I certify that I supervised this HBO treatment in accordance with Medicare guidelines. A trained emergency response team is readily available per Yes hospital policies and procedures. Continue HBOT as ordered. Yes Electronic Signature(s) Signed: 10/05/2022 4:44:38 PM By: Duanne Guess MD FACS Previous Signature: 10/05/2022 4:36:04 PM Version By: [name] EMT [place], [name] ([id number]) [id number] Page 2 of 2 Entered By: Duanne Guess on 10/05/2022 16:44:37 -------------------------------------------------------------------------------- HBO Safety Checklist Details Patient Name: Date of Service: [name] D 10/05/2022 2:00 PM Medical Record Number: [id number] Patient Account Number: [id number] Date of Birth/Sex: Treating RN: Jul 29, 1985 (37 y.o. [name] Primary Care Darcie Mellone: [name] Other Clinician: [name] Referring Romon Devereux: Treating Faisal Stradling/Extender: [name] in Treatment: 153 HBO Safety Checklist Items Safety Checklist Consent Form Signed Patient voided / foley secured and emptied When did you last eato 1200 Last dose of injectable or oral agent Yesterday Ostomy pouch emptied and vented if applicable NA All implantable devices assessed, documented and approved NA Intravenous access site secured and place NA Valuables secured Linens and cotton and cotton/polyester blend (less than 51% polyester) Personal oil-based products / skin lotions / body lotions removed Wigs or hairpieces removed NA Smoking or tobacco materials removed Books / newspapers / magazines / loose paper removed Cologne, aftershave, perfume and deodorant removed Jewelry removed (may wrap wedding band) Make-up removed NA Hair care products removed Battery operated devices (external) removed Libre3 Heating patches and chemical warmers  removed Titanium eyewear removed NA Nail polish cured greater than 10 hours NA Casting material cured greater than 10 hours NA Hearing aids removed NA Loose dentures or partials removed NA Prosthetics have been removed NA Patient demonstrates correct use of air break device (if applicable) Patient concerns have been addressed Patient grounding bracelet on and cord attached to chamber  Specifics for Inpatients (complete in addition to above) Medication sheet sent with patient NA Intravenous medications needed or due during therapy sent with patient NA Drainage tubes (e.g. nasogastric tube or chest tube secured and vented) NA Endotracheal or Tracheotomy tube secured NA Cuff deflated of air and inflated with saline NA Airway suctioned NA Notes The safety checklist was done before the treatment started. Electronic Signature(s) Signed: 10/05/2022 4:34:45 PM By: [name] EMT Entered By: [name] on 10/05/2022 16:34:45

## 2022-10-05 NOTE — Progress Notes (Signed)
[place], [name] ([id number]) [id number] Page 1 of 2 Visit Report for 10/05/2022 Arrival Information Details Patient Name: Date of Service: [name] D 10/05/2022 2:00 PM Medical Record Number: [id number] Patient Account Number: [id number] Date of Birth/Sex: Treating RN: 03/30/1986 (37 y.o. [name] Primary Care Noriko Macari: [name] Other Clinician: [name] Referring Masayuki Sakai: Treating Farouk Vivero/Extender: [name] in Treatment: 153 Visit Information History Since Last Visit All ordered tests and consults were completed: Yes Patient Arrived: Ambulatory Added or deleted any medications: No Arrival Time: 13:23 Any new allergies or adverse reactions: No Accompanied By: None Had a fall or experienced change in No Transfer Assistance: None activities of daily living that may affect Patient Identification Verified: Yes risk of falls: Secondary Verification Process Completed: Yes Signs or symptoms of abuse/neglect since last visito No Patient Requires Transmission-Based Precautions: No Hospitalized since last visit: No Patient Has Alerts: No Implantable device outside of the clinic excluding No cellular tissue based products placed in the center since last visit: Pain Present Now: No Electronic Signature(s) Signed: 10/05/2022 4:32:54 PM By: [name] EMT Entered By: [name] on 10/05/2022 16:32:54 -------------------------------------------------------------------------------- Encounter Discharge Information Details Patient Name: Date of Service: [name], [name] 10/05/2022 2:00 PM Medical Record Number: [id number] Patient Account Number: [id number] Date of Birth/Sex: Treating RN: 29-Oct-1985 (37 y.o. [name] Primary Care Taronda Comacho: [name] Other Clinician: [name] Referring Edison Nicholson: Treating Krisna Omar/Extender: [name] in Treatment:  [phone] Encounter Discharge Information Items Discharge Condition: Stable Ambulatory Status: Ambulatory Discharge Destination: Home Transportation: Private Auto Accompanied By: None Schedule Follow-up Appointment: Yes Clinical Summary of Care: Electronic Signature(s) Signed: 10/05/2022 4:37:28 PM By: [name] EMT Entered By: [name] on 10/05/2022 16:37:28 [place], [name] ([id number]) [id number] Page 2 of 2 -------------------------------------------------------------------------------- Vitals Details Patient Name: Date of Service: [name] D 10/05/2022 2:00 PM Medical Record Number: [id number] Patient Account Number: [id number] Date of Birth/Sex: Treating RN: 1985-04-16 (37 y.o. [name] Primary Care Allyiah Gartner: [name] Other Clinician: [name] Referring Tanyon Alipio: Treating Sury Wentworth/Extender: [name] in Treatment: 153 Vital Signs Time Taken: 13:32 Temperature (F): 97.4 Height (in): 77 Pulse (bpm): 105 Weight (lbs): 280 Respiratory Rate (breaths/min): 18 Body Mass Index (BMI): 33.2 Blood Pressure (mmHg): 128/76 Capillary Blood Glucose (mg/dl): 010 Reference Range: 80 - 120 mg / dl Electronic Signature(s) Signed: 10/05/2022 4:33:37 PM By: [name] EMT Entered By: [name] on 10/05/2022 16:33:37

## 2022-10-05 NOTE — Progress Notes (Signed)
[place], [name] ([id number]) [id number] Page 1 of 3 Visit Report for 10/05/2022 Problem List Details Patient Name: Date of Service: [name] D 10/05/2022 2:00 PM Medical Record Number: [id number] Patient Account Number: [id number] Date of Birth/Sex: Treating RN: 05-Apr-1985 (37 y.o. 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 09/28/2022 No Yes without evidence of necrosis L97.512 Non-pressure chronic ulcer of other part of right foot with fat layer 10/04/2022 No Yes exposed E11.621 Type 2 diabetes mellitus with foot ulcer 10/24/2019 No Yes E11.622 Type 2 diabetes mellitus with other skin ulcer 09/28/2022 No Yes Inactive Problems ICD-10 Code Description Active Date Inactive Date L97.518 Non-pressure chronic ulcer of other part of right foot with other specified 07/14/2020 07/14/2020 severity L97.518 Non-pressure chronic ulcer of other part of right foot with other specified 10/24/2019 10/24/2019 severity 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, right ankle and foot 10/24/2019 10/24/2019 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 08/10/2020 08/10/2020 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, left ankle and foot 10/24/2019 10/24/2019 L97.322 Non-pressure chronic ulcer of left ankle with fat layer exposed 09/29/2021 09/29/2021 [place], [name] ([id number]) [id number] Page 2 of 3 [phone] Non-pressure chronic ulcer of other part of left  foot with fat layer exposed 09/07/2022 09/07/2022 B95.62 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 10/24/2019 10/24/2019 classified elsewhere Resolved Problems ICD-10 Code Description Active Date Resolved Date L89.892 Pressure ulcer of other site, stage 2 07/27/2022 07/27/2022 Electronic Signature(s) Signed: 10/05/2022 4:36:46 PM By: [name] EMT Signed: 10/05/2022 4:41:42 PM By: Duanne Guess MD FACS Entered By: [name] on 10/05/2022 16:36:46 -------------------------------------------------------------------------------- SuperBill Details Patient Name: Date of Service: [name], [name] 10/05/2022 Medical Record Number: [id number] Patient Account Number: [id number] Date of Birth/Sex: Treating RN: 1986/01/11 (37 y.o. 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 without evidence of necrosis L97.512 Non-pressure chronic ulcer of other part of right foot with fat layer exposed E11.621 Type 2 diabetes mellitus with foot ulcer E11.622 Type 2 diabetes mellitus with other skin ulcer Facility Procedures : CPT4 Code Description: [id number]-(Facility Use Only) HBOT full body chamber, , ICD-10 Diagnosis Description 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 M86.172 Other acute osteomyelitis, left ankle and  foot 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 without L97.512 Non-pressure chronic ulcer of other part of right foot with fat layer exp Modifier: ne evidence of  necr osed Quantity: 4 osis Physician Procedures : CPT4 Code Description Modifier [id number] - WC PHYS HYPERBARIC OXYGEN THERAPY ICD-10 Diagnosis Description 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ne M86.172 Other acute osteomyelitis, left ankle and foot  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

## 2022-10-07 ENCOUNTER — Encounter (HOSPITAL_BASED_OUTPATIENT_CLINIC_OR_DEPARTMENT_OTHER): Payer: BC Managed Care – PPO | Admitting: General Surgery

## 2022-10-07 DIAGNOSIS — E11621 Type 2 diabetes mellitus with foot ulcer: Secondary | ICD-10-CM | POA: Diagnosis not present

## 2022-10-07 LAB — GLUCOSE, CAPILLARY
Glucose-Capillary: 131 mg/dL — ABNORMAL HIGH (ref 70–99)
Glucose-Capillary: 187 mg/dL — ABNORMAL HIGH (ref 70–99)
Glucose-Capillary: 167 mg/dL — ABNORMAL HIGH (ref 70–99)

## 2022-10-07 NOTE — Progress Notes (Signed)
[place], [name] ([id number]) [id number] Page 1 of 2 Visit Report for 10/07/2022 HBO Details Patient Name: Date of Service: [name] D 10/07/2022 10:00 A M Medical Record Number: [id number] Patient Account Number: [id number] Date of Birth/Sex: Treating RN: 1986-03-11 (37 y.o. [name], [name] Primary Care Triston Skare: [name] Other Clinician: [name] Referring Yashvi Jasinski: Treating Olinda Nola/Extender: [name] in Treatment: 154 HBO Treatment Course Details Treatment Course Number: 2 Ordering Sylvanna Burggraf: Duanne Guess T Treatments Ordered: otal 40 HBO Treatment Start Date: 10/04/2022 HBO Indication: Diabetic Ulcer(s) of the Lower Extremity Wound #3 Left, Lateral, Plantar Foot HBO Treatment Details Treatment Number: 3 Patient Type: Outpatient Chamber Type: Monoplace Chamber Serial #: [id number] Treatment Protocol: 2.0 ATA with 90 minutes oxygen, and no air breaks Treatment Details Compression Rate Down: 1.5 psi / minute De-Compression Rate Up: 2.0 psi / minute Air breaks and breathing Decompress Decompress Compress Tx Pressure Begins Reached periods Begins Ends (leave unused spaces blank) Chamber Pressure (ATA 1 2 ------2 1 ) Clock Time (24 hr) 10:54 11:07 - - - - - - 12:37 12:46 Treatment Length: 112 (minutes) Treatment Segments: 4 Vital Signs Capillary Blood Glucose Reference Range: 80 - 120 mg / dl HBO Diabetic Blood Glucose Intervention Range: <131 mg/dl or >324 mg/dl Type: Time Vitals Blood Pulse: Respiratory Temperature: Capillary Blood Glucose Pulse Action Taken: Pressure: Rate: Glucose (mg/dl): Meter #: Oximetry (%) Taken: Pre 10:12 129/77 101 14 97.1 131 Patient given 8 oz Glucerna with 8oz orange juice. Post 12:52 137/91 80 18 98.1 167 Treatment Response Treatment Toleration: Well Treatment Completion Status: Treatment Completed without Adverse Event Treatment Notes On arrival the patient blood  sugar was 131. He was given 8 oz of Glucerna with 8 oz orange juice to drink. Blood sugar was rechecked at 1042 with a blood sugar of 187. Dr. Lady Gary informed of blood sugar, heart rate, and that the patient did not eat today. Additional Procedure Documentation Tissue Sevierity: Necrosis of bone Physician HBO Attestation: I certify that I supervised this HBO treatment in accordance with Medicare guidelines. A trained emergency response team is readily available per Yes hospital policies and procedures. Continue HBOT as ordered. Yes Electronic Signature(s) Signed: 10/07/2022 2:54:22 PM By: Duanne Guess MD FACS [place], [name] ([id number]) By: Duanne Guess MD FACS [phone].pdf Page 2 of 2 Signed: 10/07/2022 2:54:22 PM Previous Signature: 10/07/2022 2:47:38 PM Version By: [name] EMT Entered By: Duanne Guess on 10/07/2022 14:54:22 -------------------------------------------------------------------------------- HBO Safety Checklist Details Patient Name: Date of Service: [name], [name] 10/07/2022 10:00 A M Medical Record Number: [id number] Patient Account Number: [id number] Date of Birth/Sex: Treating RN: 1985-05-10 (37 y.o. [name], [name] Primary Care Alister Staver: [name] Other Clinician: [name] Referring Byrd Terrero: Treating Enola Siebers/Extender: [name] in Treatment: 154 HBO Safety Checklist Items Safety Checklist Consent Form Signed Patient voided / foley secured and emptied When did you last eato yesterday Last dose of injectable or oral agent Yesterday Ostomy pouch emptied and vented if applicable NA All implantable devices assessed, documented and approved NA Intravenous access site secured and place NA Valuables secured Linens and cotton and cotton/polyester blend (less than 51% polyester) Personal oil-based products / skin lotions / body lotions removed Wigs or hairpieces removed NA Smoking or  tobacco materials removed Books / newspapers / magazines / loose paper removed Cologne, aftershave, perfume and deodorant removed Jewelry removed (may wrap wedding band) Make-up removed NA Hair care products removed Battery operated devices (external) removed Heating patches and chemical  warmers removed Titanium eyewear removed NA Nail polish cured greater than 10 hours NA Casting material cured greater than 10 hours NA Hearing aids removed NA Loose dentures or partials removed NA Prosthetics have been removed NA Patient demonstrates correct use of air break device (if applicable) Patient concerns have been addressed Patient grounding bracelet on and cord attached to chamber Specifics for Inpatients (complete in addition to above) Medication sheet sent with patient NA Intravenous medications needed or due during therapy sent with patient NA Drainage tubes (e.g. nasogastric tube or chest tube secured and vented) NA Endotracheal or Tracheotomy tube secured NA Cuff deflated of air and inflated with saline NA Airway suctioned NA Notes The safety checklist was done before the treatment started. Electronic Signature(s) Signed: 10/07/2022 2:42:11 PM By: [name] EMT Entered By: [name] on 10/07/2022 14:42:10

## 2022-10-07 NOTE — Progress Notes (Signed)
[place], [name] ([id number]) [id number] Page 1 of 2 Visit Report for 10/07/2022 Arrival Information Details Patient Name: Date of Service: [name] 10/07/2022 10:00 A M Medical Record Number: [id number] Patient Account Number: [id number] Date of Birth/Sex: Treating RN: 06-08-1985 (37 y.o. [name], [name] Primary Care Azadeh Hyder: [name] Other Clinician: [name] Referring Leyah Bocchino: Treating Sharunda Salmon/Extender: [name] in Treatment: 154 Visit Information History Since Last Visit All ordered tests and consults were completed: Yes Patient Arrived: Ambulatory Added or deleted any medications: No Arrival Time: 09:56 Any new allergies or adverse reactions: No Accompanied By: None Had a fall or experienced change in No Transfer Assistance: None activities of daily living that may affect Patient Identification Verified: Yes risk of falls: Secondary Verification Process Completed: Yes Signs or symptoms of abuse/neglect since last visito No Patient Requires Transmission-Based Precautions: No Hospitalized since last visit: No Patient Has Alerts: No Implantable device outside of the clinic excluding No cellular tissue based products placed in the center since last visit: Pain Present Now: No Electronic Signature(s) Signed: 10/07/2022 2:36:19 PM By: [name] EMT Entered By: [name] on 10/07/2022 14:36:18 -------------------------------------------------------------------------------- Encounter Discharge Information Details Patient Name: Date of Service: [name], [name] 10/07/2022 10:00 A M Medical Record Number: [id number] Patient Account Number: [id number] Date of Birth/Sex: Treating RN: [birth year] (37 y.o. [name] Primary Care Mechele Kittleson: [name] Other Clinician: [name] Referring Haden Cavenaugh: Treating Rudolph Dobler/Extender: [name] in Treatment:  154 Encounter Discharge Information Items Discharge Condition: Stable Ambulatory Status: Ambulatory Discharge Destination: Home Transportation: Private Auto Accompanied By: none Schedule Follow-up Appointment: Yes Clinical Summary of Care: Electronic Signature(s) Signed: 10/07/2022 2:49:05 PM By: [name] EMT Entered By: [name] on 10/07/2022 14:49:05 [place], [name] ([id number]) [id number] Page 2 of 2 -------------------------------------------------------------------------------- Vitals Details Patient Name: Date of Service: [name] 10/07/2022 10:00 A M Medical Record Number: [id number] Patient Account Number: [id number] Date of Birth/Sex: Treating RN: 1985-06-23 (37 y.o. [name] Primary Care Zenda Herskowitz: [name] Other Clinician: [name] Referring Rachele Lamaster: Treating [name]/Extender: [name] in Treatment: 154 Vital Signs Time Taken: 10:12 Temperature (F): 97.1 Height (in): 77 Pulse (bpm): 101 Weight (lbs): 280 Respiratory Rate (breaths/min): 14 Body Mass Index (BMI): 33.2 Blood Pressure (mmHg): 129/77 Capillary Blood Glucose (mg/dl): 366 Reference Range: 80 - 120 mg / dl Electronic Signature(s) Signed: 10/07/2022 2:39:50 PM By: [name] EMT Entered By: [name] on 10/07/2022 14:39:50

## 2022-10-07 NOTE — Progress Notes (Signed)
[place], [name] ([id number]) [id number] Page 1 of 3 Visit Report for 10/07/2022 Problem List Details Patient Name: Date of Service: [name] 10/07/2022 10:00 A M Medical Record Number: [id number] Patient Account Number: [id number] Date of Birth/Sex: Treating RN: October 31, 1985 (37 y.o. 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 09/28/2022 No Yes without evidence of necrosis L97.512 Non-pressure chronic ulcer of other part of right foot with fat layer 10/04/2022 No Yes exposed E11.621 Type 2 diabetes mellitus with foot ulcer 10/24/2019 No Yes E11.622 Type 2 diabetes mellitus with other skin ulcer 09/28/2022 No Yes Inactive Problems ICD-10 Code Description Active Date Inactive Date L97.518 Non-pressure chronic ulcer of other part of right foot with other specified 07/14/2020 07/14/2020 severity L97.518 Non-pressure chronic ulcer of other part of right foot with other specified 10/24/2019 10/24/2019 severity 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, right ankle and foot 10/24/2019 10/24/2019 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 08/10/2020 08/10/2020 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, left ankle and foot 10/24/2019 10/24/2019 L97.322 Non-pressure chronic ulcer of left ankle with fat layer exposed 09/29/2021 09/29/2021 [place], [name] ([id number]) [id number] Page 2 of 3 [phone] Non-pressure chronic ulcer of other part of left foot  with fat layer exposed 09/07/2022 09/07/2022 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.62 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 10/24/2019 10/24/2019 classified elsewhere Resolved Problems ICD-10 Code Description Active Date Resolved Date L89.892 Pressure ulcer of other site, stage 2 07/27/2022 07/27/2022 Electronic Signature(s) Signed: 10/07/2022 2:48:26 PM By: [name] EMT Signed: 10/07/2022 2:53:41 PM By: Max Guess MD FACS Entered By: [name] on 10/07/2022 14:48:25 -------------------------------------------------------------------------------- SuperBill Details Patient Name: Date of Service: [name], [name] 10/07/2022 Medical Record Number: [id number] Patient Account Number: [id number] Date of Birth/Sex: Treating RN: [birth year] (37 y.o. 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 E11.622 Type 2 diabetes mellitus with other skin ulcer Facility Procedures : CPT4 Code Description: [id number]-(Facility Use Only) HBOT full body chamber, , ICD-10 Diagnosis Description M86.172 Other acute osteomyelitis, left ankle and foot 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 without L97.512 Non-pressure chronic ulcer of other part of right foot with fat layer exp Modifier: ne evidence of necr  osed Quantity: 4 osis Physician Procedures : CPT4 Code Description Modifier [id number] - WC PHYS HYPERBARIC OXYGEN THERAPY ICD-10 Diagnosis Description M86.172 Other acute osteomyelitis, left ankle and foot 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ne  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

## 2022-10-10 ENCOUNTER — Telehealth: Payer: Self-pay | Admitting: Podiatry

## 2022-10-10 NOTE — Telephone Encounter (Signed)
Is it possible to get his surgery schedule for next week?

## 2022-10-10 NOTE — Telephone Encounter (Signed)
Called PT to see if a surgery date of 11/04/22 would be ok with him. No answer, voicemail full.

## 2022-10-12 ENCOUNTER — Encounter (HOSPITAL_BASED_OUTPATIENT_CLINIC_OR_DEPARTMENT_OTHER): Payer: BC Managed Care – PPO | Admitting: General Surgery

## 2022-10-13 ENCOUNTER — Encounter: Payer: BC Managed Care – PPO | Admitting: Podiatry

## 2022-10-14 ENCOUNTER — Encounter (HOSPITAL_BASED_OUTPATIENT_CLINIC_OR_DEPARTMENT_OTHER): Payer: BC Managed Care – PPO | Admitting: General Surgery

## 2022-10-14 ENCOUNTER — Ambulatory Visit (INDEPENDENT_AMBULATORY_CARE_PROVIDER_SITE_OTHER): Payer: BC Managed Care – PPO

## 2022-10-14 ENCOUNTER — Ambulatory Visit (INDEPENDENT_AMBULATORY_CARE_PROVIDER_SITE_OTHER): Payer: BC Managed Care – PPO | Admitting: Podiatry

## 2022-10-14 DIAGNOSIS — L97311 Non-pressure chronic ulcer of right ankle limited to breakdown of skin: Secondary | ICD-10-CM

## 2022-10-14 DIAGNOSIS — L97312 Non-pressure chronic ulcer of right ankle with fat layer exposed: Secondary | ICD-10-CM | POA: Diagnosis not present

## 2022-10-14 DIAGNOSIS — E11621 Type 2 diabetes mellitus with foot ulcer: Secondary | ICD-10-CM | POA: Diagnosis not present

## 2022-10-14 NOTE — Progress Notes (Signed)
[name] ([id number]) [id number] Page 1 of 11 Visit Report for 10/14/2022 Arrival Information Details Patient Name: Date of Service: [name] D 10/14/2022 8:15 A M Medical Record Number: [id number] Patient Account Number: [id number] Date of Birth/Sex: Treating RN: 07/15/1985 (37 y.o. [name] Primary Care Aquiles Ruffini: [name] Other Clinician: Referring Chastelyn Athens: Treating Shandelle Borrelli/Extender: [name] in Treatment: 155 Visit Information History Since Last Visit Added or deleted any medications: No Patient Arrived: Ambulatory Any new allergies or adverse reactions: No Arrival Time: 08:15 Had a fall or experienced change in No Accompanied By: self activities of daily living that may affect Transfer Assistance: None risk of falls: Patient Identification Verified: Yes Signs or symptoms of abuse/neglect since last visito No Secondary Verification Process Completed: Yes Hospitalized since last visit: No Patient Requires Transmission-Based Precautions: No Implantable device outside of the clinic excluding No Patient Has Alerts: No cellular tissue based products placed in the center since last visit: Has Dressing in Place as Prescribed: Yes Pain Present Now: No Electronic Signature(s) Signed: 10/14/2022 11:50:55 AM By: Samuella Bruin Entered By: Samuella Bruin on 10/14/2022 08:17:17 -------------------------------------------------------------------------------- Encounter Discharge Information Details Patient Name: Date of Service: [name], [name] 10/14/2022 8:15 A M Medical Record Number: [id number] Patient Account Number: [id number] Date of Birth/Sex: Treating RN: 07/13/1985 (37 y.o. [name] Primary Care Corissa Oguinn: [name] Other Clinician: Referring Xela Oregel: Treating Kamyla Olejnik/Extender: [name] in Treatment: 155 Encounter Discharge  Information Items Post Procedure Vitals Discharge Condition: Stable Temperature (F): 98.6 Ambulatory Status: Ambulatory Pulse (bpm): 96 Discharge Destination: Home Respiratory Rate (breaths/min): 18 Transportation: Private Auto Blood Pressure (mmHg): 138/93 Accompanied By: self Schedule Follow-up Appointment: Yes Clinical Summary of Care: Patient Declined Electronic Signature(s) Signed: 10/14/2022 11:50:55 AM By: Samuella Bruin Entered By: Samuella Bruin on 10/14/2022 09:38:54 [name] ([id number]) [id number] Page 2 of 11 -------------------------------------------------------------------------------- Lower Extremity Assessment Details Patient Name: Date of Service: [name] D 10/14/2022 8:15 A M Medical Record Number: [id number] Patient Account Number: [id number] Date of Birth/Sex: Treating RN: 1985/12/23 (37 y.o. [name] Primary Care Carlea Badour: [name] Other Clinician: Referring Makaleigh Reinard: Treating Ilse Billman/Extender: [name] in Treatment: 155 Edema Assessment Assessed: [name]: No] Franne Forts: No] Edema: [Left: Ye] [Right: s] Calf Left: Right: Point of Measurement: 48 cm From Medial Instep 49 cm 47.2 cm Ankle Left: Right: Point of Measurement: 11 cm From Medial Instep 30 cm 29.5 cm Electronic Signature(s) Signed: 10/14/2022 11:50:55 AM By: Samuella Bruin Entered By: Samuella Bruin on 10/14/2022 08:17:44 -------------------------------------------------------------------------------- Multi Wound Chart Details Patient Name: Date of Service: [name], [name] 10/14/2022 8:15 A M Medical Record Number: [id number] Patient Account Number: [id number] Date of Birth/Sex: Treating RN: 1985-10-09 (37 y.o. M) Primary Care Sapphira Harjo: [name] Other Clinician: Referring Jerzi Tigert: Treating Oryon Gary/Extender: [name] in Treatment: 155 Vital  Signs Height(in): 77 Capillary Blood Glucose(mg/dl): 73 Weight(lbs): 063 Pulse(bpm): 96 Body Mass Index(BMI): 33.2 Blood Pressure(mmHg): 138/93 Temperature(F): 98.6 Respiratory Rate(breaths/min): 18 [15:Photos:] Right, Dorsal Ankle Right, Medial Foot Left, Lateral, Plantar Foot Wound Location: Gradually Appeared Gradually Appeared Trauma Wounding Event: Diabetic Wound/Ulcer of the Lower Diabetic Wound/Ulcer of the Lower Diabetic Wound/Ulcer of the Lower Primary Etiology: Extremity Extremity Extremity Type II Diabetes Type II Diabetes Type II Diabetes Comorbid History: 09/13/2022 10/04/2022 10/02/2019 Date Acquired: [name] ([id number]) [id number] Page 3 of 11 2 1  155 Weeks of Treatment: Open Open Open Wound Status: No No No Wound Recurrence: 1.2x1.6x0.7 0.5x0.5x0.2 3x3.5x1.1  Measurements L x W x D (cm) 1.508 0.196 8.247 A (cm) : rea 1.056 0.039 9.071 Volume (cm) : 27.30% 37.60% -400.10% % Reduction in A rea: -1.80% -25.80% -5397.60% % Reduction in Volume: 2 Position 1 (o'clock): 2.5 Maximum Distance 1 (cm): 12 6 Starting Position 1 (o'clock): 12 12 Ending Position 1 (o'clock): 2.5 1.4 Maximum Distance 1 (cm): No No Yes Tunneling: Yes No Yes Undermining: Grade 2 Grade 2 Grade 3 Classification: Medium Medium Medium Exudate A mount: Serosanguineous Serosanguineous Serosanguineous Exudate Type: red, brown red, brown red, brown Exudate Color: Distinct, outline attached Distinct, outline attached Distinct, outline attached Wound Margin: Medium (34-66%) Small (1-33%) Large (67-100%) Granulation A mount: Red, Pink Red, Pink Red, Pink Granulation Quality: Small (1-33%) Large (67-100%) Small (1-33%) Necrotic A mount: Fat Layer (Subcutaneous Tissue): Yes Fat Layer (Subcutaneous Tissue): Yes Fat Layer (Subcutaneous Tissue): Yes Exposed Structures: Tendon: Yes Fascia: No Bone: Yes Tendon: No Fascia: No Muscle: No Tendon:  No Joint: No Muscle: No Bone: No Joint: No None Small (1-33%) None Epithelialization: Debridement - Excisional Debridement - Selective/Open Wound Debridement - Selective/Open Wound Debridement: Pre-procedure Verification/Time Out 08:35 08:35 08:35 Taken: Tendon, Ambulance person, [employer] Tissue Debrided: Skin/Subcutaneous Tissue/Muscle Non-Viable Tissue Skin/Epidermis Level: 1.51 0.2 8.24 Debridement A (sq cm): rea Curette, Forceps, Scissors Curette Curette Instrument: Minimum Minimum Minimum Bleeding: Pressure Pressure Pressure Hemostasis A chieved: Procedure was tolerated well Procedure was tolerated well Procedure was tolerated well Debridement Treatment Response: 1.2x1.6x0.7 0.5x0.5x0.2 3x3.5x1.1 Post Debridement Measurements L x W x D (cm) 1.056 0.039 9.071 Post Debridement Volume: (cm) Scarring: Yes No Abnormalities Noted Callus: Yes Periwound Skin Texture: Maceration: Yes No Abnormalities Noted Maceration: Yes Periwound Skin Moisture: Dry/Scaly: No Hemosiderin Staining: Yes No Abnormalities Noted No Abnormalities Noted Periwound Skin Color: No Abnormality No Abnormality No Abnormality Temperature: Debridement Debridement Debridement Procedures Performed: Treatment Notes Electronic Signature(s) Signed: 10/14/2022 8:47:48 AM By: Duanne Guess MD FACS Entered By: Duanne Guess on 10/14/2022 08:47:48 -------------------------------------------------------------------------------- Multi-Disciplinary Care Plan Details Patient Name: Date of Service: [name], [name] 10/14/2022 8:15 A M Medical Record Number: [id number] Patient Account Number: [id number] Date of Birth/Sex: Treating RN: 01/07/1986 (37 y.o. [name] Primary Care Santino Kinsella: [name] Other Clinician: Referring Shemeika Starzyk: Treating Zarie Kosiba/Extender: [name] in Treatment: 155 Multidisciplinary Care Plan reviewed with physician Active  Inactive Nutrition [place], [name] ([id number]) [id number] Page 4 of 11 Nursing Diagnoses: Imbalanced nutrition Potential for alteratiion in Nutrition/Potential for imbalanced nutrition Goals: Patient/caregiver agrees to and verbalizes understanding of need to use nutritional supplements and/or vitamins as prescribed Date Initiated: 10/24/2019 Date Inactivated: 04/06/2020 Target Resolution Date: 04/03/2020 Goal Status: Met Patient/caregiver will maintain therapeutic glucose control Date Initiated: 10/24/2019 Target Resolution Date: 11/04/2022 Goal Status: Active Interventions: Assess HgA1c results as ordered upon admission and as needed Assess patient nutrition upon admission and as needed per policy Provide education on elevated blood sugars and impact on wound healing Provide education on nutrition Treatment Activities: Education provided on Nutrition : 12/07/2021 Notes: 11/17/20: Glucose control ongoing issue, target date extended. 01/26/21: Glucose management continues. Wound/Skin Impairment Nursing Diagnoses: Impaired tissue integrity Knowledge deficit related to ulceration/compromised skin integrity Goals: Patient/caregiver will verbalize understanding of skin care regimen Date Initiated: 10/24/2019 Target Resolution Date: 11/04/2022 Goal Status: Active Ulcer/skin breakdown will have a volume reduction of 30% by week 4 Date Initiated: 10/24/2019 Date Inactivated: 01/16/2020 Target Resolution Date: 01/10/2020 Unmet Reason: no change in Goal Status: Unmet measurements. Interventions: Assess patient/caregiver ability to obtain necessary supplies Assess patient/caregiver ability to  perform ulcer/skin care regimen upon admission and as needed Assess ulceration(s) every visit Provide education on ulcer and skin care Notes: 11/17/20: Wound care regimen continues Electronic Signature(s) Signed: 10/14/2022 11:50:55 AM By: Samuella Bruin Entered By:  Samuella Bruin on 10/14/2022 08:39:13 -------------------------------------------------------------------------------- Pain Assessment Details Patient Name: Date of Service: [name] D 10/14/2022 8:15 A M Medical Record Number: [id number] Patient Account Number: [id number] Date of Birth/Sex: Treating RN: Apr 18, 1985 (37 y.o. [name] Primary Care Alvan Culpepper: [name] Other Clinician: Referring Kolbee Stallman: Treating Nyomie Ehrlich/Extender: [name] in Treatment: 155 Active Problems Location of Pain Severity and Description of Pain Patient Has Paino No Site Locations Rate the pain. Lover, [name] ([id number]) [id number] Page 5 of 11 Rate the pain. Current Pain Level: 0 Pain Management and Medication Current Pain Management: Electronic Signature(s) Signed: 10/14/2022 11:50:55 AM By: Samuella Bruin Entered By: Samuella Bruin on 10/14/2022 08:17:35 -------------------------------------------------------------------------------- Patient/Caregiver Education Details Patient Name: Date of Service: [name], [name] 7/12/2024andnbsp8:15 A M Medical Record Number: [id number] Patient Account Number: [id number] Date of Birth/Gender: Treating RN: [birth year] (37 y.o. [name] Primary Care Physician: [name] Other Clinician: Referring Physician: Treating Physician/Extender: [name] in Treatment: 155 Education Assessment Education Provided To: Patient Education Topics Provided Wound/Skin Impairment: Methods: Explain/Verbal Responses: Reinforcements needed, State content correctly Electronic Signature(s) Signed: 10/14/2022 11:50:55 AM By: Samuella Bruin Entered By: Samuella Bruin on 10/14/2022 08:39:27 -------------------------------------------------------------------------------- Wound Assessment Details Patient Name: Date of Service: [name],  [name] 10/14/2022 8:15 A M Medical Record Number: [id number] Patient Account Number: [id number] Date of Birth/Sex: Treating RN: [birth year] (37 y.o. [name] Primary Care Lexey Fletes: [name] Other Clinician: Referring Symphanie Cederberg: Treating Jenilee Franey/Extender: Dalene Carrow [place], [name] ([id number]) [id number] Page 6 of 11 Weeks in Treatment: 155 Wound Status Wound Number: 15 Primary Etiology: Diabetic Wound/Ulcer of the Lower Extremity Wound Location: Right, Dorsal Ankle Wound Status: Open Wounding Event: Gradually Appeared Comorbid History: Type II Diabetes Date Acquired: 09/13/2022 Weeks Of Treatment: 2 Clustered Wound: No Photos Wound Measurements Length: (cm) 1.2 Width: (cm) 1.6 Depth: (cm) 0.7 Area: (cm) 1.508 Volume: (cm) 1.056 % Reduction in Area: 27.3% % Reduction in Volume: -1.8% Epithelialization: None Tunneling: No Undermining: Yes Starting Position (o'clock): 12 Ending Position (o'clock): 12 Maximum Distance: (cm) 2.5 Wound Description Classification: Grade 2 Wound Margin: Distinct, outline attached Exudate Amount: Medium Exudate Type: Serosanguineous Exudate Color: red, brown Foul Odor After Cleansing: No Slough/Fibrino Yes Wound Bed Granulation Amount: Medium (34-66%) Exposed Structure Granulation Quality: Red, Pink Fat Layer (Subcutaneous Tissue) Exposed: Yes Necrotic Amount: Small (1-33%) Tendon Exposed: Yes Necrotic Quality: Adherent Slough Periwound Skin Texture Texture Color No Abnormalities Noted: No No Abnormalities Noted: No Scarring: Yes Hemosiderin Staining: Yes Moisture Temperature / Pain No Abnormalities Noted: No Temperature: No Abnormality Maceration: Yes Treatment Notes Wound #15 (Ankle) Wound Laterality: Dorsal, Right Cleanser Soap and Water Discharge Instruction: May shower and wash wound with dial antibacterial soap and water prior to dressing change. Vashe 5.8  (oz) Discharge Instruction: Cleanse the wound with Vashe prior to applying a clean dressing, let sit on wound for 5-10 minutesusing gauze sponges, not tissue or cotton balls. Peri-Wound Care Skin Prep Discharge Instruction: Use skin prep as directed Topical Bozman, [name] ([id number]) [id number] Page 7 of 11 Primary Dressing Promogran Prisma Matrix, 4.34 (sq in) (silver collagen) Discharge Instruction: Moisten collagen with saline or hydrogel Secondary Dressing Woven Gauze Sponge, Non-Sterile 4x4 in Discharge Instruction: Apply over primary dressing as directed. Zetuvit  Plus 4x8 in Discharge Instruction: Apply over primary dressing as directed. Zetuvit Absorbent Pad, 4x4 (in/in) Secured With Coban Self-Adherent Wrap 4x5 (in/yd) Discharge Instruction: Secure with Coban as directed. Kerlix Roll Sterile, 4.5x3.1 (in/yd) Discharge Instruction: Secure with Kerlix as directed. 108M Medipore H Soft Cloth Surgical T ape, 4 x 10 (in/yd) Discharge Instruction: Secure with tape as directed. Compression Wrap Compression Stockings Add-Ons Electronic Signature(s) Signed: 10/14/2022 11:50:55 AM By: Samuella Bruin Entered By: Samuella Bruin on 10/14/2022 08:28:16 -------------------------------------------------------------------------------- Wound Assessment Details Patient Name: Date of Service: [name], [name] 10/14/2022 8:15 A M Medical Record Number: [id number] Patient Account Number: [id number] Date of Birth/Sex: Treating RN: 07-09-1985 (37 y.o. [name] Primary Care Edelmiro Innocent: [name] Other Clinician: Referring Jadae Steinke: Treating Lachlan Pelto/Extender: [name] in Treatment: 155 Wound Status Wound Number: 16 Primary Etiology: Diabetic Wound/Ulcer of the Lower Extremity Wound Location: Right, Medial Foot Wound Status: Open Wounding Event: Gradually Appeared Comorbid History: Type II Diabetes Date  Acquired: 10/04/2022 Weeks Of Treatment: 1 Clustered Wound: No Photos Wound Measurements Length: (cm) 0.5 Width: (cm) 0.5 Depth: (cm) 0.2 Area: (cm) 0.196 Volume: (cm) 0.039 Tolles, [name] ([id number]) % Reduction in Area: 37.6% % Reduction in Volume: -25.8% Epithelialization: Small (1-33%) Tunneling: No Undermining: No [id number] Page 8 of 11 Wound Description Classification: Grade 2 Wound Margin: Distinct, outline attached Exudate Amount: Medium Exudate Type: Serosanguineous Exudate Color: red, brown Foul Odor After Cleansing: No Slough/Fibrino Yes Wound Bed Granulation Amount: Small (1-33%) Exposed Structure Granulation Quality: Red, Pink Fascia Exposed: No Necrotic Amount: Large (67-100%) Fat Layer (Subcutaneous Tissue) Exposed: Yes Necrotic Quality: Adherent Slough Tendon Exposed: No Muscle Exposed: No Joint Exposed: No Bone Exposed: No Periwound Skin Texture Texture Color No Abnormalities Noted: Yes No Abnormalities Noted: Yes Moisture Temperature / Pain No Abnormalities Noted: Yes Temperature: No Abnormality Treatment Notes Wound #16 (Foot) Wound Laterality: Right, Medial Cleanser Soap and Water Discharge Instruction: May shower and wash wound with dial antibacterial soap and water prior to dressing change. Vashe 5.8 (oz) Discharge Instruction: Cleanse the wound with Vashe prior to applying a clean dressing, let sit on wound for 5-10 minutesusing gauze sponges, not tissue or cotton balls. Peri-Wound Care Skin Prep Discharge Instruction: Use skin prep as directed Topical Primary Dressing Promogran Prisma Matrix, 4.34 (sq in) (silver collagen) Discharge Instruction: Moisten collagen with saline or hydrogel Secondary Dressing Optifoam Non-Adhesive Dressing, 4x4 in Discharge Instruction: Apply over primary dressing as directed. Woven Gauze Sponge, Non-Sterile 4x4 in Discharge Instruction: Apply over primary dressing as  directed. Secured With [employer] 4x5 (in/yd) Discharge Instruction: Secure with Coban as directed. Kerlix Roll Sterile, 4.5x3.1 (in/yd) Discharge Instruction: Secure with Kerlix as directed. 108M Medipore H Soft Cloth Surgical T ape, 4 x 10 (in/yd) Discharge Instruction: Secure with tape as directed. Compression Wrap Compression Stockings Add-Ons Electronic Signature(s) Signed: 10/14/2022 11:32:59 AM By: Dayton Scrape Signed: 10/14/2022 11:50:55 AM By: Samuella Bruin Entered By: Dayton Scrape on 10/14/2022 08:28:00 Baik, [name] ([id number]) [id number] Page 9 of 11 -------------------------------------------------------------------------------- Wound Assessment Details Patient Name: Date of Service: [name] D 10/14/2022 8:15 A M Medical Record Number: [id number] Patient Account Number: [id number] Date of Birth/Sex: Treating RN: 1985-09-06 (37 y.o. [name] Primary Care Jarae Panas: [name] Other Clinician: Referring Jevaun Strick: Treating Keena Heesch/Extender: [name] in Treatment: 155 Wound Status Wound Number: 3 Primary Etiology: Diabetic Wound/Ulcer of the Lower Extremity Wound Location: Left, Lateral, Plantar Foot Wound Status: Open Wounding Event: Trauma Comorbid History: Type II  Diabetes Date Acquired: 10/02/2019 Weeks Of Treatment: 155 Clustered Wound: No Photos Wound Measurements Length: (cm) 3 Width: (cm) 3.5 Depth: (cm) 1.1 Area: (cm) 8.247 Volume: (cm) 9.071 % Reduction in Area: -400.1% % Reduction in Volume: -5397.6% Epithelialization: None Tunneling: Yes Position (o'clock): 2 Maximum Distance: (cm) 2.5 Undermining: Yes Starting Position (o'clock): 6 Ending Position (o'clock): 12 Maximum Distance: (cm) 1.4 Wound Description Classification: Grade 3 Wound Margin: Distinct, outline attached Exudate Amount: Medium Exudate Type: Serosanguineous Exudate Color: red,  brown Foul Odor After Cleansing: No Slough/Fibrino Yes Wound Bed Granulation Amount: Large (67-100%) Exposed Structure Granulation Quality: Red, Pink Fascia Exposed: No Necrotic Amount: Small (1-33%) Fat Layer (Subcutaneous Tissue) Exposed: Yes Necrotic Quality: Adherent Slough Tendon Exposed: No Muscle Exposed: No Joint Exposed: No Bone Exposed: Yes Periwound Skin Texture Texture Color No Abnormalities Noted: No No Abnormalities Noted: Yes Callus: Yes Temperature / Pain Temperature: No Abnormality Moisture No Abnormalities Noted: No Dry / Scaly: No Hampe, [name] ([id number]) [id number] Page 10 of 11 Maceration: Yes Treatment Notes Wound #3 (Foot) Wound Laterality: Plantar, Left, Lateral Cleanser Soap and Water Discharge Instruction: May shower and wash wound with dial antibacterial soap and water prior to dressing change. Vashe 5.8 (oz) Discharge Instruction: Cleanse the wound with Vashe prior to applying a clean dressing, let sit on wound for 5-10 minutesusing gauze sponges, not tissue or cotton balls. Peri-Wound Care Skin Prep Discharge Instruction: Use skin prep as directed Topical Primary Dressing Dakin's Solution 0.25%, 16 (oz) Discharge Instruction: Moisten gauze with Dakin's solution Secondary Dressing Woven Gauze Sponge, Non-Sterile 4x4 in Discharge Instruction: Apply over primary dressing as directed. Zetuvit Plus 4x8 in Discharge Instruction: Apply over primary dressing as directed. Zetuvit Absorbent Pad, 4x4 (in/in) Secured With Coban Self-Adherent Wrap 4x5 (in/yd) Discharge Instruction: Secure with Coban as directed. Kerlix Roll Sterile, 4.5x3.1 (in/yd) Discharge Instruction: Secure with Kerlix as directed. 24M Medipore H Soft Cloth Surgical T ape, 4 x 10 (in/yd) Discharge Instruction: Secure with tape as directed. Compression Wrap Compression Stockings Add-Ons Electronic Signature(s) Signed: 10/14/2022 11:32:59 AM By:  Dayton Scrape Signed: 10/14/2022 11:50:55 AM By: Samuella Bruin Entered By: Dayton Scrape on 10/14/2022 08:28:50 -------------------------------------------------------------------------------- Vitals Details Patient Name: Date of Service: [name], [name] 10/14/2022 8:15 A M Medical Record Number: [id number] Patient Account Number: [id number] Date of Birth/Sex: Treating RN: 03/07/1986 (37 y.o. [name] Primary Care Manvir Thorson: [name] Other Clinician: Referring Kamarri Lovvorn: Treating Dericka Ostenson/Extender: [name] in Treatment: 155 Vital Signs Time Taken: 08:17 Temperature (F): 98.6 Height (in): 77 Pulse (bpm): 96 Weight (lbs): 280 Respiratory Rate (breaths/min): 18 Body Mass Index (BMI): 33.2 Blood Pressure (mmHg): 138/93 Capillary Blood Glucose (mg/dl): 73 Reference Range: 80 - 120 mg / dl Vigilante, [name] ([id number]) [id number] Page 11 of 11 Electronic Signature(s) Signed: 10/14/2022 11:50:55 AM By: Samuella Bruin Entered By: Samuella Bruin on 10/14/2022 08:17:42

## 2022-10-19 ENCOUNTER — Encounter (HOSPITAL_BASED_OUTPATIENT_CLINIC_OR_DEPARTMENT_OTHER): Payer: BC Managed Care – PPO | Admitting: General Surgery

## 2022-10-19 DIAGNOSIS — E11621 Type 2 diabetes mellitus with foot ulcer: Secondary | ICD-10-CM | POA: Diagnosis not present

## 2022-10-20 NOTE — Progress Notes (Signed)
Sirmon, [name] ([id number]) [id number] Page 1 of 11 Visit Report for 10/19/2022 Arrival Information Details Patient Name: Date of Service: [name] 10/19/2022 3:30 PM Medical Record Number: [id number] Patient Account Number: [id number] Date of Birth/Sex: Treating RN: 01-Oct-1985 (37 y.o. [name] Primary Care Syanna Remmert: [name] Other Clinician: Referring Lynnwood Beckford: Treating [name]/Extender: [name] in Treatment: 155 Visit Information History Since Last Visit Added or deleted any medications: No Patient Arrived: Ambulatory Any new allergies or adverse reactions: No Arrival Time: 15:53 Had Max fall or experienced change in No Accompanied By: self activities of daily living that may affect Transfer Assistance: None risk of falls: Patient Identification Verified: Yes Signs or symptoms of abuse/neglect since last No Secondary Verification Process Completed: Yes visito Patient Requires Transmission-Based Precautions: No Hospitalized since last visit: No Patient Has Alerts: No Implantable device outside of the clinic No excluding cellular tissue based products placed in the center since last visit: Has Dressing in Place as Prescribed: Yes Has Footwear/Offloading in Place as Yes Prescribed: Left: Removable Cast Walker/Walking Boot Pain Present Now: No Electronic Signature(s) Signed: 10/19/2022 5:33:12 PM By: Zenaida Deed RN, BSN Entered By: Zenaida Deed on 10/19/2022 15:54:27 -------------------------------------------------------------------------------- Encounter Discharge Information Details Patient Name: Date of Service: [name], [name] 10/19/2022 3:30 PM Medical Record Number: [id number] Patient Account Number: [id number] Date of Birth/Sex: Treating RN: 06/09/1985 (37 y.o. [name] Primary Care [name]: [name] Other Clinician: Referring [name]: Treating  [name]/Extender: [name] in Treatment: 155 Encounter Discharge Information Items Post Procedure Vitals Discharge Condition: Stable Temperature (F): 98.3 Ambulatory Status: Ambulatory Pulse (bpm): 106 Discharge Destination: Home Respiratory Rate (breaths/min): 18 Transportation: Private Auto Blood Pressure (mmHg): 134/82 Accompanied By: self Schedule Follow-up Appointment: Yes Clinical Summary of Care: Patient Declined Electronic Signature(s) Signed: 10/19/2022 5:33:12 PM By: Zenaida Deed RN, BSN Entered By: Zenaida Deed on 10/19/2022 17:14:56 [name], [name] ([id number]) [id number] Page 2 of 11 -------------------------------------------------------------------------------- Lower Extremity Assessment Details Patient Name: Date of Service: [name] [name] 10/19/2022 3:30 PM Medical Record Number: [id number] Patient Account Number: [id number] Date of Birth/Sex: Treating RN: [birth year] (37 y.o. [name], Bonita Quin Primary Care [name]: [name] Other Clinician: Referring Nakyia Dau: Treating Karon Heckendorn/Extender: [name] in Treatment: 155 Edema Assessment Assessed: [name]: No] [Right: No] Edema: [Left: Yes] [Right: Yes] Calf Left: Right: Point of Measurement: 48 cm From Medial Instep 48.5 cm 47 cm Ankle Left: Right: Point of Measurement: 11 cm From Medial Instep 29 cm 30.5 cm Vascular Assessment Pulses: Dorsalis Pedis Palpable: [Left:Yes] [Right:Yes] Extremity colors, hair growth, and conditions: Extremity Color: [Left:Normal] [Right:Normal] Hair Growth on Extremity: [Left:Yes] [Right:Yes] Temperature of Extremity: [Left:Warm < 3 seconds] [Right:Warm < 3 seconds] Electronic Signature(s) Signed: 10/19/2022 5:33:12 PM By: Zenaida Deed RN, BSN Entered By: Zenaida Deed on 10/19/2022  16:04:54 -------------------------------------------------------------------------------- Multi Wound Chart Details Patient Name: Date of Service: [name], [name] 10/19/2022 3:30 PM Medical Record Number: [id number] Patient Account Number: [id number] Date of Birth/Sex: Treating RN: 12/07/1985 (37 y.o. [name] Primary Care [name]: [name] Other Clinician: Referring [name]: Treating [name]/Extender: [name] in Treatment: 155 Vital Signs Height(in): 77 Capillary Blood Glucose(mg/dl): 73 Weight(lbs): 518 Pulse(bpm): 106 Body Mass Index(BMI): 33.2 Blood Pressure(mmHg): 134/82 Temperature(F): 98.3 Respiratory Rate(breaths/min): 20 [15:Photos:] [3:[id number] Page 3 of 11] Right, Dorsal Ankle Right, Medial Foot Left, Lateral, Plantar Foot Wound Location: Gradually Appeared Gradually Appeared Trauma Wounding Event: Diabetic Wound/Ulcer of the Lower Diabetic Wound/Ulcer  of the Lower Diabetic Wound/Ulcer of the Lower Primary Etiology: Extremity Extremity Extremity Type II Diabetes Type II Diabetes Type II Diabetes Comorbid History: 09/13/2022 10/04/2022 10/02/2019 Date Acquired: 3 2 155 Weeks of Treatment: Open Open Open Wound Status: No No No Wound Recurrence: 1.8x2.3x1.1 0.4x0.3x0.2 3.5x3.5x2.2 Measurements L x W x [name] 3.252 0.094 9.621 Max (cm) : rea 3.577 0.019 21.166 Volume (cm) : -56.90% 70.10% -483.40% % Reduction in Max rea: -244.90% 38.70% -12727.90% % Reduction in Volume: 4 12 Starting Position 1 (o'clock): 12 12 Ending Position 1 (o'clock): 3.1 1.5 Maximum Distance 1 (cm): Yes No Yes Undermining: Grade 2 Grade 2 Grade 3 Classification: Medium Small Large Exudate Max mount: Serosanguineous Serosanguineous Serosanguineous Exudate Type: red, brown red, brown red, brown Exudate Color: Distinct, outline attached Distinct, outline attached Well defined, not attached Wound  Margin: Medium (34-66%) Medium (34-66%) Large (67-100%) Granulation Max mount: Red, Pink Red, Pink Red, Pink Granulation Quality: Medium (34-66%) Medium (34-66%) Small (1-33%) Necrotic Max mount: Fat Layer (Subcutaneous Tissue): Yes Fat Layer (Subcutaneous Tissue): Yes Fat Layer (Subcutaneous Tissue): Yes Exposed Structures: Tendon: Yes Fascia: No Bone: Yes Muscle: Yes Tendon: No Fascia: No Muscle: No Tendon: No Joint: No Muscle: No Bone: No Joint: No None None None Epithelialization: Debridement - Excisional Debridement - Selective/Open Wound N/Max Debridement: Pre-procedure Verification/Time Out 16:15 16:15 N/Max Taken: Lidocaine 4% Topical Solution Lidocaine 4% Topical Solution N/Max Pain Control: Tendon, [facility] N/Max Tissue Debrided: Skin/Subcutaneous Tissue/Muscle Non-Viable Tissue N/Max Level: 3.25 0.09 N/Max Debridement Max (sq cm): rea Curette, Forceps, Scissors Curette N/Max Instrument: Minimum Minimum N/Max Bleeding: Pressure Pressure N/Max Hemostasis Max chieved: 0 0 N/Max Procedural Pain: 0 0 N/Max Post Procedural Pain: Procedure was tolerated well Procedure was tolerated well N/Max Debridement Treatment Response: 1.8x2.3x1.1 0.4x0.3x0.2 N/Max Post Debridement Measurements L x W x [name] 3.577 0.019 N/Max Post Debridement Volume: (cm) Scarring: Yes No Abnormalities Noted Callus: Yes Periwound Skin Texture: Maceration: Yes No Abnormalities Noted Maceration: Yes Periwound Skin Moisture: Dry/Scaly: No Dry/Scaly: No Hemosiderin Staining: Yes No Abnormalities Noted No Abnormalities Noted Periwound Skin Color: No Abnormality No Abnormality No Abnormality Temperature: Debridement Debridement N/Max Procedures Performed: Treatment Notes Electronic Signature(s) Signed: 10/19/2022 4:46:35 PM By: Duanne Guess MD FACS Signed: 10/19/2022 5:33:12 PM By: Zenaida Deed RN, BSN Entered By: Duanne Guess on 10/19/2022  16:46:35 -------------------------------------------------------------------------------- Multi-Disciplinary Care Plan Details Patient Name: Date of Service: [name], [name] 10/19/2022 3:30 PM Debenedetto, [name] ([id number]) [id number] Page 4 of 11 Medical Record Number: [id number] Patient Account Number: [id number] Date of Birth/Sex: Treating RN: 09/30/1985 (37 y.o. [name] Primary Care Emiline Mancebo: [name] Other Clinician: Referring Jeffrie Lofstrom: Treating Ayman Brull/Extender: [name] in Treatment: 155 Multidisciplinary Care Plan reviewed with physician Active Inactive Nutrition Nursing Diagnoses: Imbalanced nutrition Potential for alteratiion in Nutrition/Potential for imbalanced nutrition Goals: Patient/caregiver agrees to and verbalizes understanding of need to use nutritional supplements and/or vitamins as prescribed Date Initiated: 10/24/2019 Date Inactivated: 04/06/2020 Target Resolution Date: 04/03/2020 Goal Status: Met Patient/caregiver will maintain therapeutic glucose control Date Initiated: 10/24/2019 Target Resolution Date: 11/04/2022 Goal Status: Active Interventions: Assess HgA1c results as ordered upon admission and as needed Assess patient nutrition upon admission and as needed per policy Provide education on elevated blood sugars and impact on wound healing Provide education on nutrition Treatment Activities: Education provided on Nutrition : 02/02/2022 Notes: 11/17/20: Glucose control ongoing issue, target date extended. 01/26/21: Glucose management continues. Wound/Skin Impairment Nursing Diagnoses: Impaired tissue integrity Knowledge deficit related to ulceration/compromised skin integrity  Goals: Patient/caregiver will verbalize understanding of skin care regimen Date Initiated: 10/24/2019 Target Resolution Date: 11/04/2022 Goal Status: Active Ulcer/skin breakdown will have Max volume reduction of  30% by week 4 Date Initiated: 10/24/2019 Date Inactivated: 01/16/2020 Target Resolution Date: 01/10/2020 Unmet Reason: no change in Goal Status: Unmet measurements. Interventions: Assess patient/caregiver ability to obtain necessary supplies Assess patient/caregiver ability to perform ulcer/skin care regimen upon admission and as needed Assess ulceration(s) every visit Provide education on ulcer and skin care Notes: 11/17/20: Wound care regimen continues Electronic Signature(s) Signed: 10/19/2022 5:33:12 PM By: Zenaida Deed RN, BSN Entered By: Zenaida Deed on 10/19/2022 15:42:15 -------------------------------------------------------------------------------- Pain Assessment Details Patient Name: Date of Service: [name], [name] 10/19/2022 3:30 PM [name], [name] ([id number]) [id number] Page 5 of 11 Medical Record Number: [id number] Patient Account Number: [id number] Date of Birth/Sex: Treating RN: [birth year] (37 y.o. [name] Primary Care Ezio Wieck: [name] Other Clinician: Referring Aunesty Tyson: Treating Devyon Keator/Extender: [name] in Treatment: 155 Active Problems Location of Pain Severity and Description of Pain Patient Has Paino No Site Locations Rate the pain. Current Pain Level: 0 Pain Management and Medication Current Pain Management: Electronic Signature(s) Signed: 10/19/2022 5:33:12 PM By: Zenaida Deed RN, BSN Entered By: Zenaida Deed on 10/19/2022 15:55:04 -------------------------------------------------------------------------------- Patient/Caregiver Education Details Patient Name: Date of Service: [name] [name] 7/17/2024andnbsp3:30 PM Medical Record Number: [id number] Patient Account Number: [id number] Date of Birth/Gender: Treating RN: Jul 01, 1985 (37 y.o. [name] Primary Care Physician: [name] Other Clinician: Referring Physician: Treating  Physician/Extender: [name] in Treatment: 155 Education Assessment Education Provided To: Patient Education Topics Provided Elevated Blood Sugar/ Impact on Healing: Methods: Explain/Verbal Responses: Reinforcements needed, State content correctly Offloading: Methods: Explain/Verbal Responses: Reinforcements needed, State content correctly Wound/Skin Impairment: Methods: Explain/Verbal Responses: Reinforcements needed, State content correctly [name], [name] ([id number]) [id number] Page 6 of 11 Electronic Signature(s) Signed: 10/19/2022 5:33:12 PM By: Zenaida Deed RN, BSN Entered By: Zenaida Deed on 10/19/2022 15:42:58 -------------------------------------------------------------------------------- Wound Assessment Details Patient Name: Date of Service: [name], [name] 10/19/2022 3:30 PM Medical Record Number: [id number] Patient Account Number: [id number] Date of Birth/Sex: Treating RN: 08-27-1985 (37 y.o. [name] Primary Care Chamari Cutbirth: [name] Other Clinician: Referring Markelle Najarian: Treating Omara Alcon/Extender: [name] in Treatment: 155 Wound Status Wound Number: 15 Primary Etiology: Diabetic Wound/Ulcer of the Lower Extremity Wound Location: Right, Dorsal Ankle Wound Status: Open Wounding Event: Gradually Appeared Comorbid History: Type II Diabetes Date Acquired: 09/13/2022 Weeks Of Treatment: 3 Clustered Wound: No Photos Wound Measurements Length: (cm) 1.8 Width: (cm) 2.3 Depth: (cm) 1.1 Area: (cm) 3.252 Volume: (cm) 3.577 % Reduction in Area: -56.9% % Reduction in Volume: -244.9% Epithelialization: None Tunneling: No Undermining: Yes Starting Position (o'clock): 4 Ending Position (o'clock): 12 Maximum Distance: (cm) 3.1 Wound Description Classification: Grade 2 Wound Margin: Distinct, outline attached Exudate Amount: Medium Exudate Type:  Serosanguineous Exudate Color: red, brown Foul Odor After Cleansing: No Slough/Fibrino Yes Wound Bed Granulation Amount: Medium (34-66%) Exposed Structure Granulation Quality: Red, Pink Fat Layer (Subcutaneous Tissue) Exposed: Yes Necrotic Amount: Medium (34-66%) Tendon Exposed: Yes Necrotic Quality: Adherent Slough Muscle Exposed: Yes Necrosis of Muscle: No Periwound Skin Texture Texture Color No Abnormalities Noted: Yes No Abnormalities Noted: No Hemosiderin Staining: Yes Moisture No Abnormalities Noted: No Temperature / Pain [name], [name] ([id number]) [id number] Page 7 of 11 Dry / Scaly: No Temperature: No Abnormality Maceration: Yes Treatment Notes Wound #15 (Ankle) Wound Laterality: Dorsal, Right Cleanser Soap and Water  Discharge Instruction: May shower and wash wound with dial antibacterial soap and water prior to dressing change. Vashe 5.8 (oz) Discharge Instruction: Cleanse the wound with Vashe prior to applying Max clean dressing, let sit on wound for 5-10 minutesusing gauze sponges, not tissue or cotton balls. Peri-Wound Care Skin Prep Discharge Instruction: Use skin prep as directed Topical Primary Dressing Promogran Prisma Matrix, 4.34 (sq in) (silver collagen) Discharge Instruction: Moisten collagen with saline or hydrogel Secondary Dressing Woven Gauze Sponge, Non-Sterile 4x4 in Discharge Instruction: Apply over primary dressing as directed. Zetuvit Plus 4x8 in Discharge Instruction: Apply over primary dressing as directed. Zetuvit Absorbent Pad, 4x4 (in/in) Secured With Coban Self-Adherent Wrap 4x5 (in/yd) Discharge Instruction: Secure with Coban as directed. Kerlix Roll Sterile, 4.5x3.1 (in/yd) Discharge Instruction: Secure with Kerlix as directed. 4M Medipore H Soft Cloth Surgical T ape, 4 x 10 (in/yd) Discharge Instruction: Secure with tape as directed. Compression Wrap Compression Stockings Add-Ons Electronic  Signature(s) Signed: 10/19/2022 5:33:12 PM By: Zenaida Deed RN, BSN Entered By: Zenaida Deed on 10/19/2022 16:10:31 -------------------------------------------------------------------------------- Wound Assessment Details Patient Name: Date of Service: [name], [name] 10/19/2022 3:30 PM Medical Record Number: [id number] Patient Account Number: [id number] Date of Birth/Sex: Treating RN: 1985-11-19 (37 y.o. [name] Primary Care Azul Brumett: [name] Other Clinician: Referring Kholton Coate: Treating Shanessa Hodak/Extender: [name] in Treatment: 155 Wound Status Wound Number: 16 Primary Etiology: Diabetic Wound/Ulcer of the Lower Extremity Wound Location: Right, Medial Foot Wound Status: Open Wounding Event: Gradually Appeared Comorbid History: Type II Diabetes Date Acquired: 10/04/2022 Weeks Of Treatment: 2 Clustered Wound: No [name], [name] ([id number]) [id number] Page 8 of 11 Photos Wound Measurements Length: (cm) 0.4 Width: (cm) 0.3 Depth: (cm) 0.2 Area: (cm) 0.094 Volume: (cm) 0.019 % Reduction in Area: 70.1% % Reduction in Volume: 38.7% Epithelialization: None Tunneling: No Undermining: No Wound Description Classification: Grade 2 Wound Margin: Distinct, outline attached Exudate Amount: Small Exudate Type: Serosanguineous Exudate Color: red, brown Foul Odor After Cleansing: No Slough/Fibrino Yes Wound Bed Granulation Amount: Medium (34-66%) Exposed Structure Granulation Quality: Red, Pink Fascia Exposed: No Necrotic Amount: Medium (34-66%) Fat Layer (Subcutaneous Tissue) Exposed: Yes Necrotic Quality: Adherent Slough Tendon Exposed: No Muscle Exposed: No Joint Exposed: No Bone Exposed: No Periwound Skin Texture Texture Color No Abnormalities Noted: Yes No Abnormalities Noted: Yes Moisture Temperature / Pain No Abnormalities Noted: Yes Temperature: No Abnormality Treatment Notes Wound  #16 (Foot) Wound Laterality: Right, Medial Cleanser Soap and Water Discharge Instruction: May shower and wash wound with dial antibacterial soap and water prior to dressing change. Vashe 5.8 (oz) Discharge Instruction: Cleanse the wound with Vashe prior to applying Max clean dressing, let sit on wound for 5-10 minutesusing gauze sponges, not tissue or cotton balls. Peri-Wound Care Skin Prep Discharge Instruction: Use skin prep as directed Topical Primary Dressing Promogran Prisma Matrix, 4.34 (sq in) (silver collagen) Discharge Instruction: Moisten collagen with saline or hydrogel Secondary Dressing Optifoam Non-Adhesive Dressing, 4x4 in Discharge Instruction: Apply over primary dressing as directed. Woven Gauze Sponge, Non-Sterile 4x4 in Discharge Instruction: Apply over primary dressing as directed. Secured With [employer] 4x5 (in/yd) Discharge Instruction: Secure with Coban as directed. [name], [name] ([id number]) [id number] Page 9 of 11 Kerlix Roll Sterile, 4.5x3.1 (in/yd) Discharge Instruction: Secure with Kerlix as directed. 4M Medipore H Soft Cloth Surgical T ape, 4 x 10 (in/yd) Discharge Instruction: Secure with tape as directed. Compression Wrap Compression Stockings Add-Ons Electronic Signature(s) Signed: 10/19/2022 5:33:12 PM By: Zenaida Deed RN, BSN Entered By: Daneil Dan,  Linda on 10/19/2022 16:11:11 -------------------------------------------------------------------------------- Wound Assessment Details Patient Name: Date of Service: [name] [name] 10/19/2022 3:30 PM Medical Record Number: [id number] Patient Account Number: [id number] Date of Birth/Sex: Treating RN: [birth year] (37 y.o. [name] Primary Care Amonda Brillhart: [name] Other Clinician: Referring Pj Zehner: Treating Zakk Borgen/Extender: [name] in Treatment: 155 Wound Status Wound Number: 3 Primary Etiology: Diabetic  Wound/Ulcer of the Lower Extremity Wound Location: Left, Lateral, Plantar Foot Wound Status: Open Wounding Event: Trauma Comorbid History: Type II Diabetes Date Acquired: 10/02/2019 Weeks Of Treatment: 155 Clustered Wound: No Photos Wound Measurements Length: (cm) 3.5 Width: (cm) 3.5 Depth: (cm) 2.2 Area: (cm) 9.621 Volume: (cm) 21.166 % Reduction in Area: -483.4% % Reduction in Volume: -12727.9% Epithelialization: None Tunneling: No Undermining: Yes Starting Position (o'clock): 12 Ending Position (o'clock): 12 Maximum Distance: (cm) 1.5 Wound Description Classification: Grade 3 Wound Margin: Well defined, not attached Exudate Amount: Large Exudate Type: Serosanguineous Exudate Color: red, brown Foul Odor After Cleansing: No Slough/Fibrino Yes Wound Bed [name], [name] ([id number]) [id number] Page 10 of 11 Granulation Amount: Large (67-100%) Exposed Structure Granulation Quality: Red, Pink Fascia Exposed: No Necrotic Amount: Small (1-33%) Fat Layer (Subcutaneous Tissue) Exposed: Yes Necrotic Quality: Adherent Slough Tendon Exposed: No Muscle Exposed: No Joint Exposed: No Bone Exposed: Yes Periwound Skin Texture Texture Color No Abnormalities Noted: No No Abnormalities Noted: Yes Callus: Yes Temperature / Pain Temperature: No Abnormality Moisture No Abnormalities Noted: Yes Treatment Notes Wound #3 (Foot) Wound Laterality: Plantar, Left, Lateral Cleanser Soap and Water Discharge Instruction: May shower and wash wound with dial antibacterial soap and water prior to dressing change. Vashe 5.8 (oz) Discharge Instruction: Cleanse the wound with Vashe prior to applying Max clean dressing, let sit on wound for 5-10 minutesusing gauze sponges, not tissue or cotton balls. Peri-Wound Care Skin Prep Discharge Instruction: Use skin prep as directed Topical Primary Dressing Dakin's Solution 0.25%, 16 (oz) Discharge Instruction: Moisten gauze  with Dakin's solution Secondary Dressing Woven Gauze Sponge, Non-Sterile 4x4 in Discharge Instruction: Apply over primary dressing as directed. Zetuvit Plus 4x8 in Discharge Instruction: Apply over primary dressing as directed. Zetuvit Absorbent Pad, 4x4 (in/in) Secured With Coban Self-Adherent Wrap 4x5 (in/yd) Discharge Instruction: Secure with Coban as directed. Kerlix Roll Sterile, 4.5x3.1 (in/yd) Discharge Instruction: Secure with Kerlix as directed. 18M Medipore H Soft Cloth Surgical T ape, 4 x 10 (in/yd) Discharge Instruction: Secure with tape as directed. Compression Wrap Compression Stockings Add-Ons Electronic Signature(s) Signed: 10/19/2022 5:33:12 PM By: Zenaida Deed RN, BSN Entered By: Zenaida Deed on 10/19/2022 16:12:41 -------------------------------------------------------------------------------- Vitals Details Patient Name: Date of Service: [name], [name] 10/19/2022 3:30 PM Medical Record Number: [id number] Patient Account Number: [id number] Date of Birth/Sex: Treating RN: 10-20-1985 (37 y.o. [name] [name], [name] ([id number]) [id number] Page 11 of 11 Primary Care Daisha Filosa: [name] Other Clinician: Referring Tiandre Teall: Treating Daysia Vandenboom/Extender: [name] in Treatment: 155 Vital Signs Time Taken: 15:54 Temperature (F): 98.3 Height (in): 77 Pulse (bpm): 106 Weight (lbs): 280 Respiratory Rate (breaths/min): 20 Body Mass Index (BMI): 33.2 Blood Pressure (mmHg): 134/82 Capillary Blood Glucose (mg/dl): 73 Reference Range: 80 - 120 mg / dl Notes glucose per pt report this am Electronic Signature(s) Signed: 10/19/2022 5:33:12 PM By: Zenaida Deed RN, BSN Entered By: Zenaida Deed on 10/19/2022 15:56:27

## 2022-10-22 NOTE — H&P (View-Only) (Signed)
Subjective: Chief Complaint  Patient presents with   Wound Check    New ulcer on right ankle that may need to do surgery. PT is currently waiting ro schedule SX for existing ulcer. Patient was seen at the wound center today and treated the ulcers. Foot is still dressed did not take the dressing off.    37 year old male presents with above concerns.  He recently just came to the wound care center and new wraps were placed.  I received notifications from his wound care physician that he has a new wound on the right ankle with a tendon exposed from where the surgical shoe was rubbing.  Denies any fevers or chills.  Objective: AAO x3, NAD DP/PT pulses palpable bilaterally, CRT less than 3 seconds New wraps applied bilaterally so cannot remove them today. No pain with calf compression, swelling, warmth, erythema  Assessment: Bilateral ulcerations  Plan: I previously tried to get him scheduled for surgery on the left foot however was awaiting medical clearance.  I will try to reschedule for next week.  Discussed with him grafting of the right ankle as well and he is in agreement with this and the consent was updated.  Discussed alternatives, risks, complications and surgery.  No promises or guarantees given.  Unfortunately he is at further risk of amputation.  X-rays obtained reviewed.  No cortical changes suggest osteomyelitis of the right thigh.  No soft tissue edema. Charcot changes, arthritis noted.   Vivi Barrack DPM

## 2022-10-22 NOTE — Progress Notes (Signed)
Subjective: Chief Complaint  Patient presents with   Wound Check    New ulcer on right ankle that may need to do surgery. PT is currently waiting ro schedule SX for existing ulcer. Patient was seen at the wound center today and treated the ulcers. Foot is still dressed did not take the dressing off.    37 year old male presents with above concerns.  He recently just came to the wound care center and new wraps were placed.  I received notifications from his wound care physician that he has a new wound on the right ankle with a tendon exposed from where the surgical shoe was rubbing.  Denies any fevers or chills.  Objective: AAO x3, NAD DP/PT pulses palpable bilaterally, CRT less than 3 seconds New wraps applied bilaterally so cannot remove them today. No pain with calf compression, swelling, warmth, erythema  Assessment: Bilateral ulcerations  Plan: I previously tried to get him scheduled for surgery on the left foot however was awaiting medical clearance.  I will try to reschedule for next week.  Discussed with him grafting of the right ankle as well and he is in agreement with this and the consent was updated.  Discussed alternatives, risks, complications and surgery.  No promises or guarantees given.  Unfortunately he is at further risk of amputation.  X-rays obtained reviewed.  No cortical changes suggest osteomyelitis of the right thigh.  No soft tissue edema. Charcot changes, arthritis noted.   Vivi Barrack DPM

## 2022-10-24 ENCOUNTER — Encounter (HOSPITAL_BASED_OUTPATIENT_CLINIC_OR_DEPARTMENT_OTHER): Payer: Self-pay | Admitting: Podiatry

## 2022-10-24 ENCOUNTER — Encounter: Payer: BC Managed Care – PPO | Admitting: Podiatry

## 2022-10-24 ENCOUNTER — Encounter (HOSPITAL_BASED_OUTPATIENT_CLINIC_OR_DEPARTMENT_OTHER): Payer: Self-pay

## 2022-10-24 ENCOUNTER — Other Ambulatory Visit: Payer: Self-pay

## 2022-10-25 ENCOUNTER — Encounter (HOSPITAL_BASED_OUTPATIENT_CLINIC_OR_DEPARTMENT_OTHER)
Admission: RE | Admit: 2022-10-25 | Discharge: 2022-10-25 | Disposition: A | Discharge status: Home / Self Care | Payer: BC Managed Care – PPO | Source: Ambulatory Visit | Attending: Podiatry | Admitting: Podiatry

## 2022-10-25 DIAGNOSIS — M86171 Other acute osteomyelitis, right ankle and foot: Secondary | ICD-10-CM | POA: Diagnosis not present

## 2022-10-25 DIAGNOSIS — L97516 Non-pressure chronic ulcer of other part of right foot with bone involvement without evidence of necrosis: Secondary | ICD-10-CM | POA: Diagnosis present

## 2022-10-25 DIAGNOSIS — Z6841 Body Mass Index (BMI) 40.0 and over, adult: Secondary | ICD-10-CM | POA: Diagnosis not present

## 2022-10-25 DIAGNOSIS — M86172 Other acute osteomyelitis, left ankle and foot: Secondary | ICD-10-CM | POA: Diagnosis not present

## 2022-10-25 DIAGNOSIS — E1165 Type 2 diabetes mellitus with hyperglycemia: Secondary | ICD-10-CM | POA: Diagnosis not present

## 2022-10-25 DIAGNOSIS — L97424 Non-pressure chronic ulcer of left heel and midfoot with necrosis of bone: Secondary | ICD-10-CM | POA: Diagnosis not present

## 2022-10-25 DIAGNOSIS — Z794 Long term (current) use of insulin: Secondary | ICD-10-CM | POA: Diagnosis not present

## 2022-10-25 DIAGNOSIS — L97526 Non-pressure chronic ulcer of other part of left foot with bone involvement without evidence of necrosis: Secondary | ICD-10-CM | POA: Diagnosis present

## 2022-10-25 LAB — BASIC METABOLIC PANEL
Anion gap: 12 (ref 5–15)
BUN: 12 mg/dL (ref 6–20)
CO2: 25 mmol/L (ref 22–32)
Calcium: 9.2 mg/dL (ref 8.9–10.3)
Chloride: 100 mmol/L (ref 98–111)
Creatinine, Ser: 1.19 mg/dL (ref 0.61–1.24)
GFR, Estimated: >60 mL/min (ref >60)
Glucose, Bld: 172 mg/dL — ABNORMAL HIGH (ref 70–99)
Potassium: 4.8 mmol/L (ref 3.5–5.1)
Sodium: 137 mmol/L (ref 135–145)

## 2022-10-25 MED ORDER — CHLORHEXIDINE GLUCONATE CLOTH 2 % EX PADS: 6.0000 | MEDICATED_PAD | Freq: Once | CUTANEOUS | Status: DC

## 2022-10-25 NOTE — Progress Notes (Signed)
Pt given CHG soap and instructions for DOS

## 2022-10-25 NOTE — Anesthesia Preprocedure Evaluation (Signed)
Anesthesia Evaluation  Patient identified by MRN, date of birth, ID band Patient awake    Reviewed: Allergy & Precautions, NPO status , Patient's Chart, lab work & pertinent test results  Airway Mallampati: III  TM Distance: >3 FB Neck ROM: Full    Dental no notable dental hx. (+) Teeth Intact, Dental Advisory Given   Pulmonary    Pulmonary exam normal breath sounds clear to auscultation       Cardiovascular negative cardio ROS Normal cardiovascular exam Rhythm:Regular Rate:Normal     Neuro/Psych negative neurological ROS  negative psych ROS   GI/Hepatic negative GI ROS, Neg liver ROS,,,  Endo/Other  diabetes, Poorly Controlled, Type 2, Insulin Dependent  Morbid obesityBMI 43 FS 90  Renal/GU negative Renal ROS  negative genitourinary   Musculoskeletal  (+) Arthritis , Osteoarthritis,  Osteo B/L feet   Abdominal  (+) + obese  Peds  Hematology negative hematology ROS (+)   Anesthesia Other Findings   Reproductive/Obstetrics negative OB ROS                             Anesthesia Physical Anesthesia Plan  ASA: 3  Anesthesia Plan: General   Post-op Pain Management: Tylenol PO (pre-op)*   Induction: Intravenous  PONV Risk Score and Plan: 2 and Ondansetron, Dexamethasone, Midazolam and Treatment may vary due to age or medical condition  Airway Management Planned: LMA  Additional Equipment: None  Intra-op Plan:   Post-operative Plan: Extubation in OR  Informed Consent:   Plan Discussed with:   Anesthesia Plan Comments:        Anesthesia Quick Evaluation

## 2022-10-26 ENCOUNTER — Encounter (HOSPITAL_BASED_OUTPATIENT_CLINIC_OR_DEPARTMENT_OTHER): Payer: Self-pay | Admitting: Podiatry

## 2022-10-26 ENCOUNTER — Ambulatory Visit (HOSPITAL_BASED_OUTPATIENT_CLINIC_OR_DEPARTMENT_OTHER): Payer: BC Managed Care – PPO | Admitting: Anesthesiology

## 2022-10-26 ENCOUNTER — Ambulatory Visit (HOSPITAL_COMMUNITY): Payer: BC Managed Care – PPO

## 2022-10-26 ENCOUNTER — Encounter (HOSPITAL_BASED_OUTPATIENT_CLINIC_OR_DEPARTMENT_OTHER)
Admission: RE | Disposition: A | Discharge status: Home / Self Care | Payer: Self-pay | Source: Home / Self Care | Attending: Podiatry

## 2022-10-26 ENCOUNTER — Other Ambulatory Visit: Payer: Self-pay

## 2022-10-26 ENCOUNTER — Ambulatory Visit (HOSPITAL_BASED_OUTPATIENT_CLINIC_OR_DEPARTMENT_OTHER): Payer: BC Managed Care – PPO

## 2022-10-26 ENCOUNTER — Ambulatory Visit (HOSPITAL_BASED_OUTPATIENT_CLINIC_OR_DEPARTMENT_OTHER)
Admission: RE | Admit: 2022-10-26 | Discharge: 2022-10-26 | Disposition: A | Discharge status: Home / Self Care | Payer: BC Managed Care – PPO | Attending: Podiatry | Admitting: Podiatry

## 2022-10-26 DIAGNOSIS — L97516 Non-pressure chronic ulcer of other part of right foot with bone involvement without evidence of necrosis: Secondary | ICD-10-CM | POA: Insufficient documentation

## 2022-10-26 DIAGNOSIS — Z01818 Encounter for other preprocedural examination: Secondary | ICD-10-CM

## 2022-10-26 DIAGNOSIS — Z6841 Body Mass Index (BMI) 40.0 and over, adult: Secondary | ICD-10-CM | POA: Insufficient documentation

## 2022-10-26 DIAGNOSIS — M86672 Other chronic osteomyelitis, left ankle and foot: Secondary | ICD-10-CM | POA: Diagnosis not present

## 2022-10-26 DIAGNOSIS — M86171 Other acute osteomyelitis, right ankle and foot: Secondary | ICD-10-CM | POA: Insufficient documentation

## 2022-10-26 DIAGNOSIS — Z794 Long term (current) use of insulin: Secondary | ICD-10-CM | POA: Insufficient documentation

## 2022-10-26 DIAGNOSIS — E1036 Type 1 diabetes mellitus with diabetic cataract: Secondary | ICD-10-CM

## 2022-10-26 DIAGNOSIS — M86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, right ankle and foot: Secondary | ICD-10-CM | POA: Diagnosis not present

## 2022-10-26 DIAGNOSIS — L97426 Non-pressure chronic ulcer of left heel and midfoot with bone involvement without evidence of necrosis: Secondary | ICD-10-CM | POA: Diagnosis not present

## 2022-10-26 DIAGNOSIS — L97424 Non-pressure chronic ulcer of left heel and midfoot with necrosis of bone: Secondary | ICD-10-CM | POA: Insufficient documentation

## 2022-10-26 DIAGNOSIS — M86172 Other acute osteomyelitis, left ankle and foot: Secondary | ICD-10-CM | POA: Insufficient documentation

## 2022-10-26 DIAGNOSIS — E1165 Type 2 diabetes mellitus with hyperglycemia: Secondary | ICD-10-CM | POA: Insufficient documentation

## 2022-10-26 HISTORY — PX: AMPUTATION TOE: SHX6595

## 2022-10-26 HISTORY — PX: BONE BIOPSY: SHX375

## 2022-10-26 HISTORY — PX: WOUND DEBRIDEMENT: SHX247

## 2022-10-26 HISTORY — PX: GRAFT APPLICATION: SHX6696

## 2022-10-26 LAB — GLUCOSE, CAPILLARY
Glucose-Capillary: 90 mg/dL (ref 70–99)
Glucose-Capillary: 83 mg/dL (ref 70–99)

## 2022-10-26 LAB — AEROBIC/ANAEROBIC CULTURE W GRAM STAIN (SURGICAL/DEEP WOUND): Gram Stain: NONE SEEN

## 2022-10-26 SURGERY — AMPUTATION, TOE
Anesthesia: General | Site: Toe | Laterality: Left

## 2022-10-26 MED ORDER — LACTATED RINGERS IV SOLN: INTRAVENOUS | Status: DC

## 2022-10-26 MED ORDER — PROPOFOL 10 MG/ML IV BOLUS
INTRAVENOUS | Status: DC | PRN
  Administered 2022-10-26: 300 mg via INTRAVENOUS

## 2022-10-26 MED ORDER — OXYCODONE HCL 5 MG/5ML PO SOLN: 5.0000 mg | Freq: Once | ORAL | Status: DC | PRN

## 2022-10-26 MED ORDER — BUPIVACAINE HCL (PF) 0.5 % IJ SOLN
INTRAMUSCULAR | Status: AC
  Filled 2022-10-26: qty 30

## 2022-10-26 MED ORDER — DEXTROSE 5 % IV SOLN
INTRAVENOUS | Status: DC | PRN
  Administered 2022-10-26: 3 g via INTRAVENOUS

## 2022-10-26 MED ORDER — ACETAMINOPHEN 500 MG PO TABS
ORAL_TABLET | ORAL | Status: AC
  Filled 2022-10-26: qty 2

## 2022-10-26 MED ORDER — DEXAMETHASONE SODIUM PHOSPHATE 4 MG/ML IJ SOLN
INTRAMUSCULAR | Status: DC | PRN
  Administered 2022-10-26: 4 mg via INTRAVENOUS

## 2022-10-26 MED ORDER — FENTANYL CITRATE (PF) 100 MCG/2ML IJ SOLN
INTRAMUSCULAR | Status: DC | PRN
  Administered 2022-10-26: 50 ug via INTRAVENOUS

## 2022-10-26 MED ORDER — CEFAZOLIN IN SODIUM CHLORIDE 3-0.9 GM/100ML-% IV SOLN
INTRAVENOUS | Status: AC
  Filled 2022-10-26: qty 100

## 2022-10-26 MED ORDER — MIDAZOLAM HCL 2 MG/2ML IJ SOLN
INTRAMUSCULAR | Status: AC
  Filled 2022-10-26: qty 2

## 2022-10-26 MED ORDER — AMISULPRIDE (ANTIEMETIC) 5 MG/2ML IV SOLN: 10.0000 mg | Freq: Once | INTRAVENOUS | Status: DC | PRN

## 2022-10-26 MED ORDER — LIDOCAINE HCL 2 % IJ SOLN
INTRAMUSCULAR | Status: DC | PRN
  Administered 2022-10-26: 12.5 mL

## 2022-10-26 MED ORDER — BUPIVACAINE HCL (PF) 0.5 % IJ SOLN
INTRAMUSCULAR | Status: DC | PRN
  Administered 2022-10-26: 12.5 mL

## 2022-10-26 MED ORDER — MEPERIDINE HCL 25 MG/ML IJ SOLN: 6.2500 mg | INTRAMUSCULAR | Status: DC | PRN

## 2022-10-26 MED ORDER — ONDANSETRON HCL 4 MG/2ML IJ SOLN
INTRAMUSCULAR | Status: DC | PRN
  Administered 2022-10-26: 4 mg via INTRAVENOUS

## 2022-10-26 MED ORDER — LIDOCAINE HCL (CARDIAC) PF 100 MG/5ML IV SOSY
PREFILLED_SYRINGE | INTRAVENOUS | Status: DC | PRN
  Administered 2022-10-26: 80 mg via INTRAVENOUS

## 2022-10-26 MED ORDER — AMOXICILLIN-POT CLAVULANATE 875-125 MG PO TABS: 1.0000 | ORAL_TABLET | Freq: Two times a day (BID) | ORAL | 0 refills | Status: AC

## 2022-10-26 MED ORDER — OXYCODONE HCL 5 MG PO TABS: 5.0000 mg | ORAL_TABLET | Freq: Once | ORAL | Status: DC | PRN

## 2022-10-26 MED ORDER — LIDOCAINE HCL 2 % IJ SOLN
INTRAMUSCULAR | Status: AC
  Filled 2022-10-26: qty 20

## 2022-10-26 MED ORDER — HYDROCODONE-ACETAMINOPHEN 5-325 MG PO TABS: 1.0000 | ORAL_TABLET | Freq: Four times a day (QID) | ORAL | 0 refills | Status: DC | PRN

## 2022-10-26 MED ORDER — PHENYLEPHRINE 80 MCG/ML (10ML) SYRINGE FOR IV PUSH (FOR BLOOD PRESSURE SUPPORT)
PREFILLED_SYRINGE | INTRAVENOUS | Status: DC | PRN
  Administered 2022-10-26: 160 ug via INTRAVENOUS

## 2022-10-26 MED ORDER — DEXMEDETOMIDINE HCL IN NACL 80 MCG/20ML IV SOLN
INTRAVENOUS | Status: DC | PRN
  Administered 2022-10-26: 8 ug via INTRAVENOUS

## 2022-10-26 MED ORDER — ONDANSETRON HCL 4 MG/2ML IJ SOLN: 4.0000 mg | Freq: Once | INTRAMUSCULAR | Status: DC | PRN

## 2022-10-26 MED ORDER — 0.9 % SODIUM CHLORIDE (POUR BTL) OPTIME
TOPICAL | Status: DC | PRN
  Administered 2022-10-26: 500 mL

## 2022-10-26 MED ORDER — MIDAZOLAM HCL 5 MG/5ML IJ SOLN
INTRAMUSCULAR | Status: DC | PRN
  Administered 2022-10-26: 2 mg via INTRAVENOUS

## 2022-10-26 MED ORDER — FENTANYL CITRATE (PF) 100 MCG/2ML IJ SOLN
INTRAMUSCULAR | Status: AC
  Filled 2022-10-26: qty 2

## 2022-10-26 MED ORDER — GLYCOPYRROLATE PF 0.2 MG/ML IJ SOSY
PREFILLED_SYRINGE | INTRAMUSCULAR | Status: AC
  Filled 2022-10-26: qty 1

## 2022-10-26 MED ORDER — HYDROMORPHONE HCL 1 MG/ML IJ SOLN: 0.2500 mg | INTRAMUSCULAR | Status: DC | PRN

## 2022-10-26 MED ORDER — ACETAMINOPHEN 500 MG PO TABS
1000.0000 mg | ORAL_TABLET | Freq: Once | ORAL | Status: AC
  Administered 2022-10-26: 1000 mg via ORAL

## 2022-10-26 SURGICAL SUPPLY — 48 items
BLADE AVERAGE 25X9 (BLADE) ×3 IMPLANT
BLADE SURG 15 STRL LF DISP TIS (BLADE) ×6 IMPLANT
BLADE SURG 15 STRL SS (BLADE) ×6
BNDG CMPR 5X3 KNIT ELC UNQ LF (GAUZE/BANDAGES/DRESSINGS) ×3
BNDG CMPR 75X21 PLY HI ABS (MISCELLANEOUS) ×3
BNDG CMPR 9X4 STRL LF SNTH (GAUZE/BANDAGES/DRESSINGS) ×3
BNDG ELASTIC 3INX 5YD STR LF (GAUZE/BANDAGES/DRESSINGS) ×3 IMPLANT
BNDG ESMARK 4X9 LF (GAUZE/BANDAGES/DRESSINGS) ×3 IMPLANT
BNDG GAUZE DERMACEA FLUFF 4 (GAUZE/BANDAGES/DRESSINGS) IMPLANT
BNDG GZE DERMACEA 4 6PLY (GAUZE/BANDAGES/DRESSINGS)
COVER BACK TABLE 60X90IN (DRAPES) ×3 IMPLANT
CUFF TOURN SGL QUICK 18X4 (TOURNIQUET CUFF) IMPLANT
DRAPE EXTREMITY T 121X128X90 (DISPOSABLE) ×3 IMPLANT
DRAPE IMP U-DRAPE 54X76 (DRAPES) ×3 IMPLANT
DRAPE OEC MINIVIEW 54X84 (DRAPES) IMPLANT
DRSG EMULSION OIL 3X3 NADH (GAUZE/BANDAGES/DRESSINGS) ×3 IMPLANT
DURAPREP 26ML APPLICATOR (WOUND CARE) ×3 IMPLANT
ELECT REM PT RETURN 9FT ADLT (ELECTROSURGICAL) ×3
ELECTRODE REM PT RTRN 9FT ADLT (ELECTROSURGICAL) ×3 IMPLANT
GAUZE 4X4 16PLY ~~LOC~~+RFID DBL (SPONGE) IMPLANT
GAUZE SPONGE 4X4 12PLY STRL (GAUZE/BANDAGES/DRESSINGS) ×3 IMPLANT
GAUZE STRETCH 2X75IN STRL (MISCELLANEOUS) ×3 IMPLANT
GLOVE BIO SURGEON STRL SZ7.5 (GLOVE) ×6 IMPLANT
GOWN STRL REUS W/ TWL LRG LVL3 (GOWN DISPOSABLE) ×3 IMPLANT
GOWN STRL REUS W/ TWL XL LVL3 (GOWN DISPOSABLE) ×3 IMPLANT
GOWN STRL REUS W/TWL LRG LVL3 (GOWN DISPOSABLE) ×3
GOWN STRL REUS W/TWL XL LVL3 (GOWN DISPOSABLE) ×3
MATRIX WOUND 3-LAYER 10X15 (Tissue) IMPLANT
MICROMATRIX 1000MG (Tissue) ×3 IMPLANT
MICROMATRIX 500MG (Tissue) ×6 IMPLANT
NDL HYPO 25X1 1.5 SAFETY (NEEDLE) ×3 IMPLANT
NDL SAFETY ECLIP 18X1.5 (MISCELLANEOUS) IMPLANT
NEEDLE HYPO 25X1 1.5 SAFETY (NEEDLE) ×3 IMPLANT
NS IRRIG 1000ML POUR BTL (IV SOLUTION) IMPLANT
PACK BASIN DAY SURGERY FS (CUSTOM PROCEDURE TRAY) ×3 IMPLANT
PADDING CAST ABS COTTON 4X4 ST (CAST SUPPLIES) ×3 IMPLANT
PENCIL SMOKE EVACUATOR (MISCELLANEOUS) ×3 IMPLANT
SOLUTION PARTIC MCRMTRX 1000MG (Tissue) IMPLANT
SOLUTION PARTIC MCRMTRX 500MG (Tissue) IMPLANT
STOCKINETTE 6 STRL (DRAPES) ×3 IMPLANT
STRIP SUTURE WOUND CLOSURE 1/2 (MISCELLANEOUS) IMPLANT
SUT MERSILENE 2.0 SH NDLE (SUTURE) IMPLANT
SUT MNCRL AB 3-0 PS2 18 (SUTURE) ×3 IMPLANT
SUT MNCRL AB 4-0 PS2 18 (SUTURE) ×3 IMPLANT
SUT MON AB 5-0 PS2 18 (SUTURE) ×3 IMPLANT
SYR 10ML LL (SYRINGE) IMPLANT
SYR BULB EAR ULCER 3OZ GRN STR (SYRINGE) ×3 IMPLANT
UNDERPAD 30X36 HEAVY ABSORB (UNDERPADS AND DIAPERS) ×3 IMPLANT

## 2022-10-26 NOTE — Transfer of Care (Signed)
Immediate Anesthesia Transfer of Care Note  Patient: [name]  Procedure(s) Performed: AMPUTATION TOE, 5TH LEFT (Left: Toe) DEBRIDEMENT WOUND LEFT AND RIGHT (Bilateral: Foot) BONE BIOPSY LEFT (Left: Foot) SKIN GRAFT, LEFT AND RIGHT (Bilateral: Foot)  Patient Location: PACU  Anesthesia Type:General  Level of Consciousness: drowsy and patient cooperative  Airway & Oxygen Therapy: Patient Spontanous Breathing and Patient connected to face mask oxygen  Post-op Assessment: Report given to RN and Post -op Vital signs reviewed and stable  Post vital signs: Reviewed and stable  Last Vitals:  Vitals Value Taken Time  BP 132/76 10/26/22 1405  Temp    Pulse 67 10/26/22 1410  Resp 12 10/26/22 1410  SpO2 96 % 10/26/22 1410  Vitals shown include unfiled device data.  Last Pain:  Vitals:   10/26/22 0958  TempSrc: Temporal  PainSc: 0-No pain      Patients Stated Pain Goal: 3 (10/26/22 7829)  Complications: No notable events documented.

## 2022-10-26 NOTE — Anesthesia Procedure Notes (Signed)
Procedure Name: LMA Insertion Date/Time: 10/26/2022 12:33 PM  Performed by: Alvera Novel, CRNAPre-anesthesia Checklist: Patient identified, Emergency Drugs available, Suction available and Patient being monitored Patient Re-evaluated:Patient Re-evaluated prior to induction Oxygen Delivery Method: Circle System Utilized Preoxygenation: Pre-oxygenation with 100% oxygen Induction Type: IV induction Ventilation: Mask ventilation without difficulty LMA: LMA with gastric port inserted LMA Size: 5.0 Number of attempts: 1 Airway Equipment and Method: Bite block Placement Confirmation: positive ETCO2 Tube secured with: Tape Dental Injury: Teeth and Oropharynx as per pre-operative assessment

## 2022-10-26 NOTE — Brief Op Note (Signed)
10/26/2022  1:58 PM  PATIENT:  [name]  37 y.o. 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 WITHOUT EVIDENCE OF NECROSIS  PROCEDURE:  Left 5th metatarsal excision; bone biopsy cuboid; ulcer debridement bilateral with A-Cell graft application  SURGEON:  Surgeons and Role:    * Vivi Barrack, DPM - Primary  PHYSICIAN ASSISTANT:   ASSISTANTS: none   ANESTHESIA:   general  EBL:  40ml   BLOOD ADMINISTERED:none  DRAINS: none   LOCAL MEDICATIONS USED:  MARCAINE   , BUPIVICAINE , and Amount: 25 ml  SPECIMEN:  Source of Specimen:  left 5th metatarsal for pathology; left cuboid for pathology and microbiology  DISPOSITION OF SPECIMEN:  PATHOLOGY  COUNTS:  YES  TOURNIQUET:   Total Tourniquet Time Documented: Ankle (Left) - 23 minutes Total: Ankle (Left) - 23 minutes   DICTATION: .Reubin Milan Dictation  PLAN OF CARE: Discharge to home after PACU  PATIENT DISPOSITION:  PACU - hemodynamically stable.   Delay start of Pharmacological VTE agent (>24hrs) due to surgical blood loss or risk of bleeding: no  Intraoperative findings: Excised left 5th metatarsal. Cuboid appeared soft and culture/path obtained. Debrided b/l ulcerations and graft applied  Right 2.5 x 2.5 x 1 cm Left 4.2 x 2.8 x 2 cm

## 2022-10-26 NOTE — Progress Notes (Signed)
Pfeifle, [name] ([id number]) [id number] Page 1 of 25 Visit Report for 09/07/2022 Chief Complaint Document Details Patient Name: Date of Service: [name] D 09/07/2022 8:15 A M Medical Record Number: [id number] Patient Account Number: [id number] Date of Birth/Sex: Treating RN: November 21, 1985 (36 y.o. M) Primary Care Provider: [name] Other Clinician: Referring Provider: Treating Provider/Extender: [name] in Treatment: 149 Information Obtained from: Patient Chief Complaint 01/11/2019; patient is here for review of a rather substantial wound over the left fifth plantar metatarsal head extending into the lateral part of his foot 10/24/2019; patient returns to clinic with wounds on his bilateral feet with underlying osteomyelitis biopsy-proven Electronic Signature(s) Signed: 09/07/2022 9:42:19 AM By: Duanne Guess MD FACS Entered By: Duanne Guess on 09/07/2022 09:42:18 -------------------------------------------------------------------------------- Debridement Details Patient Name: Date of Service: [name], [name] 09/07/2022 8:15 A M Medical Record Number: [id number] Patient Account Number: [id number] Date of Birth/Sex: Treating RN: [birth year] (36 y.o. [name], Bonita Quin Primary Care Provider: [name] Other Clinician: Referring Provider: Treating Provider/Extender: [name] in Treatment: 149 Debridement Performed for Assessment: Wound #14 Left,Dorsal Foot Performed By: Physician Duanne Guess, MD Debridement Type: Debridement Severity of Tissue Pre Debridement: Fat layer exposed Level of Consciousness (Pre-procedure): Awake and Alert Pre-procedure Verification/Time Out Yes - 09:00 Taken: Start Time: 09:02 Pain Control: Lidocaine 4% T opical Solution Percent of Wound Bed Debrided: 100% T Area Debrided (cm): otal 1.1 Tissue and other material debrided: Viable, Non-Viable,  Slough, Subcutaneous, Slough Level: Skin/Subcutaneous Tissue Debridement Description: Excisional Instrument: Curette Bleeding: Minimum Hemostasis Achieved: Pressure Procedural Pain: Insensate Post Procedural Pain: Insensate Response to Treatment: Procedure was tolerated well Level of Consciousness (Post- Awake and Alert procedure): Post Debridement Measurements of Total Wound Length: (cm) 1 Width: (cm) 1.4 Depth: (cm) 0.1 Volume: (cm) 0.11 Character of Wound/Ulcer Post Debridement: Improved Arriola, [name] ([id number]) [id number] Page 2 of 25 Severity of Tissue Post Debridement: Fat layer exposed Post Procedure Diagnosis Same as Pre-procedure Notes scribed for Dr. Lady Gary by Zenaida Deed, RN Electronic Signature(s) Signed: 09/07/2022 12:20:18 PM By: Duanne Guess MD FACS Signed: 09/07/2022 4:18:00 PM By: Zenaida Deed RN, BSN Entered By: Zenaida Deed on 09/07/2022 09:05:29 -------------------------------------------------------------------------------- Debridement Details Patient Name: Date of Service: [name], [name] 09/07/2022 8:15 A M Medical Record Number: [id number] Patient Account Number: [id number] Date of Birth/Sex: Treating RN: Jul 30, 1985 (36 y.o. [name] Primary Care Provider: [name] Other Clinician: Referring Provider: Treating Provider/Extender: [name] in Treatment: 149 Debridement Performed for Assessment: Wound #3 Left,Lateral,Plantar Foot Performed By: Physician Duanne Guess, MD Debridement Type: Debridement Severity of Tissue Pre Debridement: Necrosis of bone Level of Consciousness (Pre-procedure): Awake and Alert Pre-procedure Verification/Time Out Yes - 09:00 Taken: Start Time: 09:02 Pain Control: Lidocaine 4% T opical Solution Percent of Wound Bed Debrided: 100% T Area Debrided (cm): otal 7.06 Tissue and other material debrided: Viable, Non-Viable, Bone, Callus,  Subcutaneous, Skin: Epidermis Level: Skin/Subcutaneous Tissue/Muscle/Bone Debridement Description: Excisional Instrument: Curette, Rongeur Specimen: Tissue Culture Number of Specimens T aken: 2 Bleeding: Minimum Hemostasis Achieved: Pressure Procedural Pain: Insensate Post Procedural Pain: Insensate Response to Treatment: Procedure was tolerated well Level of Consciousness (Post- Awake and Alert procedure): Post Debridement Measurements of Total Wound Length: (cm) 2.5 Width: (cm) 3.6 Depth: (cm) 1.2 Volume: (cm) 8.482 Character of Wound/Ulcer Post Debridement: Improved Severity of Tissue Post Debridement: Necrosis of bone Post Procedure Diagnosis Same as Pre-procedure Notes Scribed for Dr Lady Gary by Zenaida Deed, RN Electronic Signature(s) Signed: 09/07/2022  12:20:18 PM By: Duanne Guess MD FACS Signed: 09/07/2022 4:18:00 PM By: Zenaida Deed RN, BSN Entered By: Zenaida Deed on 09/07/2022 09:16:31 Bracco, [name] ([id number]) [id number] Page 3 of 25 -------------------------------------------------------------------------------- HPI Details Patient Name: Date of Service: [name] D 09/07/2022 8:15 A M Medical Record Number: [id number] Patient Account Number: [id number] Date of Birth/Sex: Treating RN: [birth year] (36 y.o. 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 He comes in for review of a wound over the plantar fifth metatarsal head extending into the lateral part of the foot. He was followed for this previously by his podiatrist Dr. Darreld Mclean. As the patient tells his story he went to see podiatry first for a swelling he developed on the lateral part of his fifth metatarsal  head in May. He states this was "open" by podiatry and the area closed. He was followed up in June and it was again opened callus removed and it closed promptly. There were plans being made for surgery on the fifth metatarsal head in June however his blood sugar was apparently too high for anesthesia. Apparently the area was debrided and opened again in June and it is never closed since. Looking over the records from podiatry I am really not able to follow this. It was clear when he was first seen it was before 5/14 at that point he already had a wound. By 5/17 the ulcer was resolved. I do not see anything about a procedure. On 5/28 noted to have pre-ulcerative moderate keratosis. X-ray noted 1/5 contracted toe and tailor's bunion and metatarsal deformity. On a visit date on 09/28/2018 the dorsal part of the left foot it healed and resolved. There was concern about swelling in his lower extremity he was sent to the ER.. As far as I can tell he was seen in the ER on 7/12 with an ulcer on his left foot. A DVT rule out of the left leg was negative. I do not think I have complete records from podiatry but I am not able to verify the procedures this patient states he had. He states after the last procedure the wound has never closed although I am not able to follow this in the records I have from podiatry. He has not had a recent x-ray The patient has been using Neosporin on the wound. He is wearing a Darco shoe. He is still very active up on his foot working and exercising. Past medical history; type 2 diabetes ketosis-prone, leg swelling with a negative DVT study in July. Non-smoker ABI in our clinic was 0.85 on the left 10/16; substantial wound on the plantar left fifth met head extending laterally almost to the dorsal fifth MTP. We have been using silver alginate we gave him a Darco forefoot off loader. An x-ray did not show evidence of osteomyelitis did note soft tissue emphysema which I think was due  to gas tracking through an open wound. There is no doubt in my mind he requires an MRI 10/23; MRI not booked until 3 November at the earliest this is largely due to his glucose sensor in the right arm. We have been using silver alginate. There has been an improvement 10/29; I am still not exactly sure when his MRI is booked for. He says it is the third but it  is the 10th in epic. This definitely needs to be done. He is running a low-grade fever today but no other symptoms. No real improvement in the 1 02/26/2019 patient presents today for a follow-up visit here in our clinic he is last been seen in the clinic on October 29. Subsequently we were working on getting MRI to evaluate and see what exactly was going on and where we would need to go from the standpoint of whether or not he had osteomyelitis and again what treatments were going be required. Subsequently the patient ended up being admitted to the hospital on 02/07/2019 and was discharged on 02/14/2019. This is a somewhat interesting admission with a discharge diagnosis of pneumonia due to COVID-19 although he was positive for COVID-19 when tested at the urgent care but negative x2 when he was actually in the hospital. With that being said he did have acute respiratory failure with hypoxia and it was noted he also have a left foot ulceration with osteomyelitis. With that being said he did require oxygen for his pneumonia and I level 4 L. He was placed on antivirals and steroids for the COVID-19. He was also transferred to the [facility] campus at one point. Nonetheless he did subsequently discharged home and since being home has done much better in that regard. The CT angiogram did not show any pulmonary embolism. With regard to the osteomyelitis the patient was placed on vancomycin and Zosyn while in the hospital but has been changed to Augmentin at discharge. It was also recommended that he follow- up with wound care and podiatry. Podiatry  however wanted him to see [name] according to the patient prior to them doing anything further. His hemoglobin A1c was 9.9 as noted in the hospital. Have an MRI of the left foot performed while in the hospital on 02/04/2019. This showed evidence of septic arthritis at the fifth MTP joint and osteomyelitis involving the fifth metatarsal head and proximal phalanx. There is an overlying plantar open wound noted an abscess tracking back along the lateral aspect of the fifth metatarsal shaft. There is otherwise diffuse cellulitis and mild fasciitis without findings of polymyositis. The patient did have recently pneumonia secondary to COVID-19 I looked in the chart through epic and it does appear that the patient may need to have an additional x-ray just to ensure everything is cleared and that he has no airspace disease prior to putting him into the chamber. 03/05/2019; patient was readmitted to the clinic last week. He was hospitalized twice for a viral upper respiratory tract infection from 11/1 through 11/4 and then 11/5 through 11/12 ultimately this turned out to be Covid pneumonitis. Although he was discharged on oxygen he is not using it. He says he feels fine. He has no exercise limitation no cough no sputum. His O2 sat in our clinic today was 100% on room air. He did manage to have his MRI which showed septic arthritis at the fifth MTP joint and osteomyelitis involving the fifth metatarsal head and proximal phalanx. He received Vanco and Zosyn in the hospital and then was discharged on 2 weeks of Augmentin. I do not see any relevant cultures. He was supposed to follow-up with infectious disease but I do not see that he has an appointment. 12/8; patient saw Dr. Ronnie Derby of infectious disease last week. He felt that he had had adequate antibiotic therapy. He did not go to follow-up with Dr. Logan Bores of podiatry and I have again talked to him about the  pros and cons of this. He does not want to consider a ray  amputation of this time. He is aware of the risks of recurrence, migration etc. He started HBO today and tolerated this well. He can complete the Augmentin that I gave him last week. I have looked over the lab work that Dr. Effie Shy ordered his C-reactive protein was 3.3 and his sedimentation rate was 17. The C-reactive protein is never really been measurably that high in this patient 12/15; not much change in the wound today however he has undermining along the lateral part of the foot again more extensively than last week. He has some rims of epithelialization. We have been using silver alginate. He is undergoing hyperbarics but did not dive today 12/18; in for his obligatory first total contact cast change. Unfortunately there was pus coming from the undermining area around his fifth metatarsal head. This was cultured but will preclude reapplication of a cast. He is seen in conjunction with HBO 12/24; patient had staph lugdunensis in the wound in the undermining area laterally last time. We put him on doxycycline which should have covered this. The wound looks better today. I am going to give him another week of doxycycline before reattempting the total contact cast 12/31; the patient is completing antibiotics. Hemorrhagic debris in the distal part of the wound with some undermining distally. He also had hyper granulation. Extensive debridement with a #5 curette. The infected area that was on the lateral part of the fifth met head is closed over. I do not think he needs any more antibiotics. Patient was seen prior to HBO. Preparations for a total contact cast were made in the cast will be placed post hyperbarics 04/11/19; once again the patient arrives today without complaint. He had been in a cast all week noted that he had heavy drainage this week. This resulted in large raised areas of macerated tissue around the wound Tout, [name] ([id number]) [phone].pdf Page 4 of  25 1/14; wound bed looks better slightly smaller. Hydrofera Blue has been changing himself. He had a heavy drainage last week which caused a lot of maceration around the wound so I took him out of a total contact cast he says the drainage is actually better this week He is seen today in conjunction with HBO 1/21; returns to clinic. He was up in [place] for a day or 2 attending a funeral. He comes back in with the wound larger and with a large area of exposed bone. He had osteomyelitis and septic arthritis of the fifth left metatarsal head while he was in hospital. He received IV antibiotics in the hospital for a prolonged period of time then 3 weeks of Augmentin. Subsequently I gave him 2 weeks of doxycycline for more superficial wound infection. When I saw this last week the wound was smaller the surface of the wound looks satisfactory. 1/28; patient missed hyperbarics today. Bone biopsy I did last time showed Enterococcus faecalis and Staphylococcus lugdunensis . He has a wide area of exposed bone. We are going to use silver alginate as of today. I had another ethical discussion with the patient. This would be recurrent osteomyelitis he is already received IV antibiotics. In this situation I think the likelihood of healing this is low. Therefore I have recommended a ray amputation and with the patient's agreement I have referred him to Dr. Victorino Dike. The other issue is that his compliance with hyperbarics has been minimal because of his work schedule and given his underlying  decision I am going to stop this today READMISSION 10/24/2019 MRI 09/29/2019 left foot IMPRESSION: 1. Apparent skin ulceration inferior and lateral to the 5th metatarsal base with underlying heterogeneous T2 signal and enhancement in the subcutaneous fat. Small peripherally enhancing fluid collections along the plantar and lateral aspects of the 5th metatarsal base suspicious for abscesses. 2. Interval amputation through the  mid 5th metatarsal with nonspecific low-level marrow edema and enhancement. Given the proximity to the adjacent soft tissue inflammatory changes, osteomyelitis cannot be excluded. 3. The additional bones appear unremarkable. MRI 09/29/2019 right foot IMPRESSION: 1. Soft tissue ulceration lateral to the 5th MTP joint. There is low-level T2 hyperintensity within the 4th and 5th metatarsal heads and adjacent proximal phalanges without abnormal T1 signal or cortical destruction. These findings are nonspecific and could be seen with early marrow edema, hyperemia or early osteomyelitis. No evidence of septic joint. 2. Mild tenosynovitis and synovial enhancement associated with the extensor digitorum tendons at the level of the midfoot. 3. Diffuse low-level muscular T2 hyperintensity and enhancement, most consistent with diabetic myopathy. LEFT FOOT BONE Methicillin resistant staphylococcus aureus Staphylococcus lugdunensis MIC MIC CIPROFLOXACIN >=8 RESISTANT Resistant <=0.5 SENSI... Sensitive CLINDAMYCIN <=0.25 SENS... Sensitive >=8 RESISTANT Resistant ERYTHROMYCIN >=8 RESISTANT Resistant >=8 RESISTANT Resistant GENTAMICIN <=0.5 SENSI... Sensitive <=0.5 SENSI... Sensitive Inducible Clindamycin NEGATIVE Sensitive NEGATIVE Sensitive OXACILLIN >=4 RESISTANT Resistant 2 SENSITIVE Sensitive RIFAMPIN <=0.5 SENSI... Sensitive <=0.5 SENSI... Sensitive TETRACYCLINE <=1 SENSITIVE Sensitive <=1 SENSITIVE Sensitive TRIMETH/SULFA <=10 SENSIT Sensitive <=10 SENSIT Sensitive ... Marland Kitchen.. VANCOMYCIN 1 SENSITIVE Sensitive <=0.5 SENSI... Sensitive Right foot bone . Component 3 wk ago Specimen Description BONE Special Requests RIGHT 4 METATARSAL SAMPLE B Gram Stain NO WBC SEEN NO ORGANISMS SEEN Culture RARE METHICILLIN RESISTANT STAPHYLOCOCCUS AUREUS NO ANAEROBES ISOLATED Performed at [facility], [address], [place], [place] [address] Report Status 10/08/2019 FINAL Organism ID, Bacteria  METHICILLIN RESISTANT STAPHYLOCOCCUS AUREUS Resulting Agency [facility] Susceptibility Methicillin resistant staphylococcus aureus MIC CIPROFLOXACIN >=8 RESISTANT Resistant CLINDAMYCIN <=0.25 SENS... Sensitive ERYTHROMYCIN >=8 RESISTANT Resistant GENTAMICIN <=0.5 SENSI... Sensitive Inducible Clindamycin NEGATIVE Sensitive OXACILLIN >=4 RESISTANT Resistant RIFAMPIN <=0.5 SENSI... Sensitive TETRACYCLINE <=1 SENSITIVE Sensitive TRIMETH/SULFA <=10 SENSIT Sensitive ... VANCOMYCIN 1 SENSITIVE Sensitive Luckenbaugh, [name] ([id number]) [id number] Page 5 of 25 This is a patient we had in clinic earlier this year with a wound over his left fifth metatarsal head. 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 Victorino Dike on 05/16/2019. He seems to have developed problems with wounds on his bilateral feet in June 2021 although he may have had problems earlier than this. He was in an urgent care with a right foot ulcer on 09/26/2019 and given a course of doxycycline. This was apparently after having trouble getting into see orthopedics. He was seen by podiatry on 09/28/2019 noted to have bilateral lower extremity ulcers including the left lateral fifth metatarsal base and the right subfifth met head. It was noted that had purulent drainage at that time. He required hospitalization from 6/20 through 7/2. This was because of worsening right foot wounds. He underwent bilateral operative incision and drainage and bone biopsies bilaterally. Culture results are listed above. He has been referred back to clinic by Dr. Ardelle Anton of podiatry. He is also followed by Dr. Orvan Falconer who  saw him yesterday. He was discharged from  hospital on Zyvox Flagyl and Levaquin and yesterday changed to doxycycline Flagyl and Levaquin. His inflammatory markers on 6/26 showed a sedimentation rate of 129 and a C-reactive protein of 5. This is improved to 14 and 1.3 respectively. This would indicate improvement. ABIs in our clinic today were 1.23 on the right and 1.20 on the left 11/01/2019 on evaluation today patient appears to be doing fairly well in regard to the wounds on his feet at this point. Fortunately there is no signs of active infection at this time. No fevers, chills, nausea, vomiting, or diarrhea. He currently is seeing infectious disease and still under their care at this point. Subsequently he also has both wounds which she has not been using collagen on as he did not receive that in his packaging he did not call us and let us know that. Apparently that just was missed on the order. Nonetheless we will get that straightened out today. 8/9-Patient returns for bilateral foot wounds, using Prisma with hydrogel moistened dressings, and the wounds appear stable. Patient using surgical shoes, avoiding much pressure or weightbearing as much as possible 8/16; patient has bilateral foot wounds. 1 on the right lateral foot proximally the other is on the left mid lateral foot. Both required debridement of callus and thick skin around the wounds. We have been using silver collagen 8/27; patient has bilateral lateral foot wounds. The area on the left substantially surrounded by callus and dry skin. This was removed from the wound edge. The underlying wound is small. The area on the right measured somewhat smaller today. We've been using silver collagen the patient was on antibiotics for underlying osteomyelitis in the left foot. Unfortunately I did not update his antibiotics during today's visit. 9/10 I reviewed Dr. Blair Dolphin last notes he felt he had completed antibiotics his inflammatory markers were reasonably well controlled. He has a  small wound on the lateral left foot and a tiny area on the right which is just above closed. He is using Hydrofera Blue with border foam he has bilateral surgical shoes 9/24; 2 week f/u. doing well. right foot is closed. left foot still undermined. 10/14; right foot remains closed at the fifth met head. The area over the base of the left fifth metatarsal has a small open area but considerable undermining towards the plantar foot. Thick callus skin around this suggests an adequate pressure relief. We have talked about this. He says he is going to go back into his cam boot. I suggested a total contact cast he did not seem enamored with this suggestion 10/26; left foot base of the fifth metatarsal. Same condition as last time. He has skin over the area with an open wound however the skin is not adherent. He went to see Dr. Loreta Ave who did an x-ray and culture of his foot I have not reviewed the x-ray but the patient was not told anything. He is on doxycycline 11/11; since the patient was last here he was in the emergency room on 10/30 he was concerned about swelling in the left foot. They did not do any cultures or x-rays. They changed his antibiotics to cephalexin. Previous culture showed group B strep. The cephalexin is appropriate as doxycycline has less than predictable coverage. Arrives in clinic today with swelling over this area under the wound. He also has a new wound on the right fifth metatarsal head 11/18; the patient has a difficult wound on the lateral aspect of  the left fifth metatarsal head. The wound was almost ballotable last week I opened it slightly expecting to see purulence however there was just bleeding. I cultured this this was negative. X-ray unchanged. We are trying to get an MRI but I am not sure were going to be able to get this through his insurance. He also has an area on the right lateral fifth metatarsal head this looks healthier 12/3; the patient finally got our MRI.  Surprisingly this did not show osteomyelitis. I did show the soft tissue ulceration at the lateral plantar aspect of the fifth metatarsal base with a tiny residual 6 mm abscess overlying the superficial fascia I have tried to culture this area I have not been able to get this to grow anything. Nevertheless the protruding tissue looks aggravated. I suspect we should try to treat the underlying "abscess with broad-spectrum antibiotics. I am going to start him on Levaquin and Flagyl. He has much less edema in his legs and I am going to continue to wrap his legs and see him weekly 12/10. I started Levaquin and Flagyl on him last week. He just picked up the Flagyl apparently there was some delay. The worry is the wound on the left fifth metatarsal base which is substantial and worsening. His foot looks like he inverts at the ankle making this a weightbearing surface. Certainly no improvement in fact I think the measurements of this are somewhat worse. We have been using 12/17; he apparently just got the Levaquin yesterday this is 2 weeks after the fact. He has completed the Flagyl. The area over the left fifth metatarsal base still has protruding granulation tissue although it does not look quite as bad as it did some weeks ago. He has severe bilateral lymphedema although we have not been treating him for wounds on his legs this is definitely going to require compression. There was so much edema in the left I did not wish to put him in a total contact cast today. I am going to increase his compression from 3-4 layer. The area on the right lateral fifth met head actually look quite good and superficial. 12/23; patient arrived with callus on the right fifth met head and the substantial hyper granulated callused wound on the base of his fifth metatarsal. He says he is completing his Levaquin in 2 days but I do not think that adds up with what I gave him but I will have to double check this. We are using  Hydrofera Blue on both areas. My plan is to put the left leg in a cast the week after New Year's 04/06/2020; patient's wounds about the same. Right lateral fifth metatarsal head and left lateral foot over the base of the fifth metatarsal. There is undermining on the left lateral foot which I removed before application of total contact cast continuing with Hydrofera Blue new. Patient tells me he was seen by endocrinology today lab work was done [Dr. Kerr]. Also wondering whether he was referred to cardiology. I went over some lab work from previously does not have chronic renal failure certainly not nephrotic range proteinuria he does have very poorly controlled diabetes but this is not his most updated lab work. Hemoglobin A1c has been over 11 1/10; the patient had a considerable amount of leakage towards mid part of his left foot with macerated skin however the wound surface looks better the area on the right lateral fifth met head is better as well. I am going to change  the dressing on the left foot under the total contact cast to silver alginate, continue with Hydrofera Blue on the right. 1/20; patient was in the total contact cast for 10 days. Considerable amount of drainage although the skin around the wound does not look too bad on the left foot. The area on the right fifth metatarsal head is closed. Our nursing staff reports large amount of drainage out of the left lateral foot wound 1/25; continues with copious amounts of drainage described by our intake staff. PCR culture I did last week showed E. coli and Enterococcus faecalis and low quantities. Multiple resistance genes documented including extended spectrum beta lactamase, MRSA, MRSE, quinolone, tetracycline. The wound is not quite as good this week as it was 5 days ago but about the same size 2/3; continues with copious amounts of malodorous drainage per our intake nurse. The PCR culture I did 2 weeks ago showed E. coli and low quantities  of Enterococcus. There were multiple resistance genes detected. I put Neosporin on him last week although this does not seem to have helped. The wound is slightly deeper today. Offloading continues to be an issue here although with the amount of drainage she has a total contact cast is just not going to work 2/10; moderate amount of drainage. Patient reports he cannot get his stocking on over the dressing. I told him we have to do that the nurse gave him suggestions on how to make this work. The wound is on the bottom and lateral part of his left foot. Is cultured predominantly grew low amounts of Enterococcus, E. coli and anaerobes. There were multiple resistance genes detected including extended spectrum beta lactamase, quinolone, tetracycline. I could not think of an easy oral combination to address this so for now I am going to do topical antibiotics provided by [facility] I think the main agents here are vancomycin and an aminoglycoside. We have to be able to give him access to the wounds to get the topical antibiotic on 2/17; moderate amount of drainage this is unchanged. He has his Keystone topical antibiotic against the deep tissue culture organisms. He has been using this and changing the dressing daily. Silver alginate on the wound surface. 2/24; using Keystone antibiotic with silver alginate on the top. He had too much drainage for a total contact cast at one point although I think that is improving and I think in the next week or 2 it might be possible to replace a total contact cast I did not do this today. In general the wound surface looks healthy however he continues to have thick rims of skin and subcutaneous tissue around the wide area of the circumference which I debrided 06/04/2020 upon evaluation today patient appears to be doing well in regard to his wound. I do feel like he is showing signs of improvement. There is little bit of callus and dead tissue around the edges of the wound as  well as what appears to be a little bit of a sinus tract that is off to the side laterally I would perform debridement to clear that away today. 3/17; left lateral foot. The wound looks about the same as I remember. Not much depth surface looks healthy. No evidence of infection 3/25; left lateral foot. Wound surface looks about the same. Separating epithelium from the circumference. There really is no evidence of infection here Townley, [name] ([id number]) [id number] Page 6 of 25 however not making progress by my view 3/29; left lateral foot. Surface  of the wound again looks reasonably healthy still thick skin and subcutaneous tissue around the wound margins. There is no evidence of infection. One of the concerns being brought up by the nurses has again the amount of drainage vis--vis continued use of a total contact cast 4/5; left lateral foot at roughly the base of the fifth metatarsal. Nice healthy looking granulated tissue with rims of epithelialization. The overall wound measurements are not any better but the tissue looks healthy. The only concern is the amount of drainage although he has no surrounding maceration with what we have been doing recently to absorb fluid and protect his skin. He also has lymphedema. 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 He states that the kerlix/coban under the TCC rolled down and could not get it back up. He has been using an offloading scooter and has somehow hurt his right foot using this device. This happened last week. He states that the side of his right foot developed a blister and opened. The top of his foot also has a few small open wounds he thinks is due to his socks rubbing in  his shoes. He has not been using any dressings to the wound. He denies purulent drainage, fever/chills or erythema to the wounds. 4/22; patient presents for 1 week follow-up. He developed new wounds to the right foot that were evaluated at last clinic visit. He continues to have a total contact cast to the left leg and he reports no issues. He has been using silver collagen to the right foot wounds with no issues. He denies purulent drainage, fever/chills or erythema to the right foot wounds. He has no complaints today 4/25; patient presents for 1 week follow-up. He has a total contact cast of the left leg and reports no issues. He has been using silver alginate to the right foot wound. He denies purulent drainage, fever/chills or erythema to the right foot wounds. 5/2 patient presents for 1 week follow-up. T contact cast on the left. The wound which is on the base of the plantar foot at the base of the fifth metatarsal otal actually looks quite good and dimensions continue to gradually contract. HOWEVER the area on the right lateral fifth metatarsal head is much larger than what I remember from 2 weeks ago. Once more is he has significant levels of hypergranulation. Noteworthy that he had this same hyper granulated response on his wound on the left foot at one point in time. So much so that he I thought there was an underlying fluid collection. Based on this I think this just needs debridement. 5/9; the wound on the left actually continues to be gradually smaller with a healthy surface. Slight amount of drainage and maceration of the skin around but not too bad. However he has a large wound over the right fifth metatarsal head very much in the same configuration as his left foot wound was initially. I used silver nitrate to address the hyper granulated tissue no mechanical debridement 5/16; area on the left foot did not look as healthy this week deeper thick surrounding macerated skin and  subcutaneous tissue. The area on the right foot fifth met head was about the same The area on the right ankle  that we identified last week is completely broken down into an open wound presumably a stocking rubbing issue 5/23; patient has been using a total contact cast to the left side. He has been using silver alginate underneath. He has also been using silver alginate to the right foot wounds. He has no complaints today. He denies any signs of infection. 5/31; the left-sided wound looks some better measure smaller surface granulation looks better. We have been using silver alginate under the total contact cast The large area on his right fifth met head and right dorsal foot look about the same still using silver alginate 6/6; neither side is good as I was hoping although the surface area dimensions are better. A lot of maceration on his left and right foot around the wound edge. Area on the dorsal right foot looks better. He says he was traveling. I am not sure what does the amount of maceration around the plantar wounds may be drainage issues 6/13; in general the wound surfaces look quite good on both sides. Macerated skin and raised edges around the wound required debridement although in general especially on the left the surface area seems improved. The area on the right dorsal ankle is about the same I thought this would not be such a problem to close 6/20; not much change in either wound although the one on the right looks a little better. Both wounds have thick macerated edges to the skin requiring debridements. We have been using silver alginate. The area on the dorsal right ankle is still open I thought this would be closed. 6/28; patient comes in today with a marked deterioration in the right foot wound fifth met head. Wide area of exposed bone this is a drastic change from last time. The area on the left there we have been casting is stagnant. We have been using silver alginate in both  wound areas. 7/5; bone culture I did for PCR last time was positive for Pseudomonas, group B strep, Enterococcus and Staph aureus. There was no suggestion of methicillin resistance or ampicillin resistant genes. This was resistant to tetracycline however He comes into the clinic today with the area over his right plantar fifth metatarsal head which had been doing so well 2 weeks ago completely necrotic feeling bone. I do not know that this is going to be salvageable. The left foot wound is certainly no smaller but it has a better surface and is superficial. 7/8; patient called in this morning to say that his total contact cast was rubbing against his foot. He states he is doing fine overall. He denies signs of infection. 7/12; continued deterioration in the wound over the right fifth metatarsal head crumbling bone. This is not going to be salvageable. The patient agrees and wants to be referred to Dr. Victorino Dike which we will attempt to arrange as soon as possible. I am going to continue him on antibiotics as long as that takes so I will renew those today. The area on the left foot which is the base of the fifth metatarsal continues to look somewhat better. Healthy looking tissue no depth no debridement is necessary here. 7/20; the patient was kindly seen by Dr. Victorino Dike of orthopedics on 10/19/2020. 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 Towards this end we have taken him out of the total contact cast on the left we will put  him in a wrap with Hydrofera Blue. As I understand things surgery is planned for 7/21 7/27; patient had his surgery last Thursday. 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 Apparently everything went well we did not still disturb that today The area on the left foot actually looks quite good. He has been much less mobile which probably explains this he did not seem to do well in the total  contact cast secondary to drainage and maceration I think. 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 Fortunately there is no evidence of active infection at this time. No fevers, chills, nausea, vomiting, or diarrhea. Overall I think that he is actually doing extremely well. Nonetheless I do believe that he is staying off of this more following the surgery in his right foot that is the reason the left is doing so great. 8/16; left plantar foot wound. This looks smaller than the last time I saw this he is using Hydrofera Blue. The surgical wound on the right foot is being followed by Dr. Victorino Dike we did not look at this today. He has surgical shoes on both feet 8/23; left plantar foot wound not as good this week. Surrounding macerated skin and subcutaneous tissue everything looks moist and wet. I do not think he is offloading this adequately. He is using a surgical shoe Apparently the right foot surgical wound is not open although I did not check his foot 8/31; left plantar foot lateral aspect. Much improved this week. He has no maceration. Some improvement in the surface area of the wound but most impressively the depth is come in we are using silver alginate. The patient is a Veterinary surgeon. He is asked that we write him a letter so he can go back to work. I have also tried to see if we can write something that will allow him to limit the amount of time that he is on his foot at work. 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 Hopefully the latter can be avoided Memon, [name] ([id number]) [id number] Page 7 of 25 9/6; I believe the patient missed an appointment last week. He arrives in today with a wound looking roughly the same certainly no better. Undermining laterally and  also inferiorly. We used molecuLight today in training with the patient's permission.. We are using silver alginate 9/21 wound is measuring bigger this week although this may have to do with the aggressive circumferential debridement last week in response to the blush fluorescence on the MolecuLight. Culture I did last week showed significant MSSA and E. coli. I put him on Augmentin but he has not started it yet. We are also going to send this for compounded antibiotics at [facility]. There is no evidence of systemic infection 9/29; silver alginate. His Keystone arrived. He is completing Augmentin in 2 days. Offloading in a cam boot. Moderate drainage per our intake staff 10/5; using silver alginate. He has been using his [place]. He has completed his Augmentin. Per our intake nurse still a lot of drainage, far too much to consider a total contact cast. Wound measures about the same. He had the same undermining area that I defined last week from a roughly 11-3. I remove this today 10/12; using silver alginate he is using the [place]. He comes in for a  nurse visit hence we are applying Jodie Echevaria twice a week. Measuring slightly better today and less notable drainage. Extensive debridement of the wound edge last time 10/18; using topical Keystone and silver alginate and a soft cast. Wound measurements about the same. Drainage was through his soft cast. We are changing this twice a week Tuesdays and Friday 10/25; comes in with moderate drainage. Still using Keystone silver alginate and a soft cast. Wound dimensions completely the same.He has a lot of edema in the left leg he has lymphedema. Asking for [name] to consider wrapping him as he cannot get his stocking on over the soft cast 11/2; comes in with moderate to large drainage slightly smaller in terms of width we have been using [place]. His wound looks satisfactory but not much improvement 11/4; patient presents today for obligatory cast change. Has no  issues or complaints today. He denies signs of infection. 11/9; patient traveled this weekend to [place], was on the cast quite a bit. Staining of the cast with black material from his walking boot. Drainage was not quite as bad as we feared. Using silver alginate and Keystone 11/16; we do not have size for cast therefore we have been putting a soft cast on him since the change on Friday. Still a significant amount of drainage necessitating changing twice a week. We have been using the Keystone at cast changes either hard or soft as well as silver alginate Comes in the clinic with things actually looking fairly good improvement in width. He says his offloading is about the same 02/24/2021 upon evaluation today patient actually comes back in and is doing excellent in regard to his foot ulcer this is significantly smaller even compared to the last visit. The soft cast seems to have done extremely well for him which is great news. I do not see any signs of infection minimal debridement will be needed today. 11/30; left lateral foot much improved half a centimeter improvement in surface area. No evidence of infection. He seems to be doing better with the soft cast in the TCC therefore we will continue with this. He comes back in later in the week for a change with the nurses. This is due to drainage 12/6; no improvement in dimensions. Under illumination some debris on the surface we have been using silver alginate, soft cast. If there is anything optimistic here he seems to have have less drainage 12/13. Dimensions are improved both length and width and slightly in depth. Appears to be quite healthy today. Raised edges of this thick skin and callus around the edges however. He is in a soft cast were bringing him back once for a change on Friday. Drainage is better 12/20. Dimensions are improved. He still has raised edges of thick skin and callus around the edges. We are using a soft cast 12/28; comes in today  with thick callus around the wound. Using silver under alginate under a soft cast. I do not think there is much improvement in any measurement 2023 04/06/2021; patient was put in a total contact cast. Unfortunately not much change in surface area 1/10; not much different still thick callus and skin around the edge in spite of the total contact cast. This was just debrided last week we have been using the [facility] compounded antibiotic and silver alginate under a total contact cast 1/18 the patient's wound on the left side is doing nicely. smaller HOWEVER he comes in today with a wound on the right foot laterally. blister most  likely serosangquenous drainage 1/24; the patient continues to do well in terms of the plantar left foot which is continued to contract using silver alginate under the total contact cast HOWEVER the right lateral foot is bigger with denuded skin around the edges. I used pickups and a #15 scalpel to remove this this looks like the remanence of a large blister. Cannot rule out infection. Culture in this area I did last week showed Staphylococcus lugdunensis few colonies. I am going to try to address this with his Jodie Echevaria antibiotic that is done so well on the left having linezolid and this should cover the staph 2/1; the patient's wound on his left foot which was the original plantar foot wound thick skin and eschar around the edges even in the total contact cast but the wound surface does not look too bad The real problem is on how his right lateral foot at roughly the base of the fifth metatarsal. The wound is completely necrotic more worrisome than that there is swelling around the edges of this. We have been using silver alginate on both wounds and Keystone on the right foot. Unfortunately I think he is going to require systemic antibiotics while we await cultures. He did not get the x-ray done that we ordered last week [lost the prescription 2/7; disappointingly in the area  on the left foot which we are treating with a total contact cast is still not closed although it is much smaller. He continues to have a lot of callus around the wound edge. -Right lateral foot culture I did last week was negative x-ray also negative for osteomyelitis. 2/15: TCC silver alginate on the left and silver alginate on the right lateral. No real improvement in either area 05/26/2021: T oday, the wounds are roughly the same size as at his previous visit, post-debridement. He continues to endorse fairly substantial drainage, particularly on the right. He has been in a total contact cast on the left. There is still some callus surrounding this lesion. On the right, the periwound skin is quite macerated, along with surrounding callus. The center of the right-sided wound also has some dark, densely adherent material, which is very difficult to remove. 06/02/2021: Today, both wounds are slightly smaller. He has been using zinc oxide ointment around the right ulcer and the degree of maceration has improved markedly. There continues to be an area of nonviable tissue in the center of the right sided ulcer. The left-sided wound, which has been in the total contact cast. Appears clean and the degree of callus around it is less than previously. 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 He had to take the stairs and both wounds have increased in size. The left foot, which has been in a total contact cast, has developed a tunnel tracking to the lateral aspect of his foot. The nonviable tissue in the center of the right-sided ulcer remains recalcitrant to debridement. There is significant undermining surrounding the entirety of the left sided wound. 06/16/2021: The elevator at school has been fixed and the patient has been able to avoid putting as much weight on his wounds over the past week. We converted the left foot wound into a single lesion  today, but despite this, the wound is actually smaller. The base is healthy with limited periwound callus. On the right, the central necrotic area is still present. He continues to be quite macerated around the right sided wound, despite applying  barrier cream. This does, however, have the benefit of softening the callus to make it more easily removable. 06/23/2021: Today, the left wound is smaller. The lateral aspect that had opened up previously is now closed. The wound base has a healthy bed of granulation tissue and minimal slough. Unfortunately, on the right, the wound is larger and continues to be fairly macerated. He has also reopened the wound at his right ankle. He thinks this is due to the gait he has adopted secondary to his total contact cast and boot. 06/30/2021: T oday, both wounds are a little bit larger. The lateral aspect on the left has remained closed. He continues to have significant periwound maceration. The culture that I took from the right sided wound grew a population of bacteria that is not covered by his current [facility] antibiotic. The center of the rightAaric Dolph, [name] ([id number]) [id number] Page 8 of 25 sided wound continues to appear necrotic with nonviable fat. It probes deeper today, but does not reach bone. 07/07/2021: The periwound maceration is a little bit less today. The right lateral foot wound has some areas that appear more viable and the necrotic center also looks a little bit better. The wound on the dorsal surface of his right foot near the ankle is contracting and the surface appears healthy. The left plantar wound surface looks healthy, but there is some new undermining on the medial portion. He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 [phone].pdf Page 13 of 25 B95.62 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 10/24/2019 10/24/2019 classified elsewhere Resolved Problems ICD-10 Code Description Active Date Resolved Date L89.892 Pressure ulcer of other site, stage 2 07/27/2022 07/27/2022 Electronic Signature(s) Signed: 09/07/2022 9:41:00 AM By: Duanne Guess MD FACS Entered By: Duanne Guess on 09/07/2022 09:40:59 -------------------------------------------------------------------------------- Progress Note Details Patient Name: Date of Service: [name], [name] 09/07/2022 8:15 A M Medical Record Number: [id number] Patient Account Number: [id number] Date of Birth/Sex: Treating RN: [birth year] (36 y.o. 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 He comes in for review of a wound over the plantar fifth metatarsal head extending into the lateral part of the foot. He was followed for this previously by his podiatrist Dr. Darreld Mclean. As the patient tells his story he went to see podiatry first for a swelling he developed on the lateral part of his fifth metatarsal head in May. He states this was "open" by podiatry and the area closed. He was followed up in June and it was again opened callus removed and it closed promptly. There were plans being made for surgery on the fifth metatarsal head in June however his blood sugar was apparently too high for anesthesia. Apparently the area was debrided and opened again in June and it is never closed since. Looking over the records from podiatry I am really not able to follow this. It was clear when he was first seen it was before 5/14 at that point he already had a wound. By 5/17 the ulcer was resolved. I do not see anything about a procedure. On 5/28 noted to have pre-ulcerative moderate keratosis. X-ray noted 1/5 contracted toe and tailor's bunion and metatarsal deformity. On a visit date on 09/28/2018 the dorsal part of the left foot it healed and resolved. There was concern about swelling in his lower extremity he was sent to the ER.. As far as I can tell he was seen in the ER on 7/12 with an ulcer on his left foot. A DVT rule out of the left leg was negative. I do not think I have complete records from podiatry but I am not able to verify the procedures this patient states he had. He states after the last procedure the wound has never closed although I am not able to follow this in the records I have from podiatry. He has not had a recent x-ray The patient has been using Neosporin on the wound. He is wearing a Darco shoe. He is still very  active up on his foot working and exercising. Past medical history; type 2 diabetes ketosis-prone, leg swelling with a negative DVT study in July. Non-smoker ABI in our clinic was 0.85 on the left 10/16; substantial wound on the plantar left fifth met head extending laterally almost to the dorsal fifth MTP. We have been using silver alginate we gave him  a Darco forefoot off loader. An x-ray did not show evidence of osteomyelitis did note soft tissue emphysema which I think was due to gas tracking through an open wound. There is no doubt in my mind he requires an MRI 10/23; MRI not booked until 3 November at the earliest this is largely due to his glucose sensor in the right arm. We have been using silver alginate. There has been an improvement 10/29; I am still not exactly sure when his MRI is booked for. He says it is the third but it is the 10th in epic. This definitely needs to be done. He is running a low-grade fever today but no other symptoms. No real improvement in the 1 02/26/2019 patient presents today for a follow-up visit here in our clinic he is last been seen in the clinic on October 29. Subsequently we were working on getting MRI to evaluate and see what exactly was going on and where we would need to go from the standpoint of whether or not he had osteomyelitis and again what treatments were going be required. Subsequently the patient ended up being admitted to the hospital on 02/07/2019 and was discharged on 02/14/2019. This is a somewhat interesting admission with a discharge diagnosis of pneumonia due to COVID-19 although he was positive for COVID-19 when tested at the urgent care but negative x2 when he was actually in the hospital. With that being said he did have acute respiratory failure with hypoxia and it was noted he also have a left foot ulceration with osteomyelitis. With that being said he did require oxygen for his pneumonia and I level 4 L. He was placed on antivirals and  steroids for the COVID-19. He was also transferred to the [facility] at one point. Nonetheless he did subsequently discharged home and since being home has done much better in that regard. The CT angiogram did not show any pulmonary embolism. With regard to the osteomyelitis the patient was placed on vancomycin and Zosyn while in the hospital but has been changed to Augmentin at discharge. It was also recommended that he followHaydin Calandra, [name] ([id number]) [id number] Page 14 of 25 up with wound care and podiatry. Podiatry however wanted him to see [name] according to the patient prior to them doing anything further. His hemoglobin A1c was 9.9 as noted in the hospital. Have an MRI of the left foot performed while in the hospital on 02/04/2019. This showed evidence of septic arthritis at the fifth MTP joint and osteomyelitis involving the fifth metatarsal head and proximal phalanx. There is an overlying plantar open wound noted an abscess tracking back along the lateral aspect of the fifth metatarsal shaft. There is otherwise diffuse cellulitis and mild fasciitis without findings of polymyositis. The patient did have recently pneumonia secondary to COVID-19 I looked in the chart through epic and it does appear that the patient may need to have an additional x-ray just to ensure everything is cleared and that he has no airspace disease prior to putting him into the chamber. 03/05/2019; patient was readmitted to the clinic last week. He was hospitalized twice for a viral upper respiratory tract infection from 11/1 through 11/4 and then 11/5 through 11/12 ultimately this turned out to be Covid pneumonitis. Although he was discharged on oxygen he is not using it. He says he feels fine. He has no exercise limitation no cough no sputum. His O2 sat in our clinic today was 100% on room air. He did manage to  have his MRI which showed septic arthritis at the fifth MTP joint and  osteomyelitis involving the fifth metatarsal head and proximal phalanx. He received Vanco and Zosyn in the hospital and then was discharged on 2 weeks of Augmentin. I do not see any relevant cultures. He was supposed to follow-up with infectious disease but I do not see that he has an appointment. 12/8; patient saw Dr. Ronnie Derby of infectious disease last week. He felt that he had had adequate antibiotic therapy. He did not go to follow-up with Dr. Logan Bores of podiatry and I have again talked to him about the pros and cons of this. He does not want to consider a ray amputation of this time. He is aware of the risks of recurrence, migration etc. He started HBO today and tolerated this well. He can complete the Augmentin that I gave him last week. I have looked over the lab work that Dr. Effie Shy ordered his C-reactive protein was 3.3 and his sedimentation rate was 17. The C-reactive protein is never really been measurably that high in this patient 12/15; not much change in the wound today however he has undermining along the lateral part of the foot again more extensively than last week. He has some rims of epithelialization. We have been using silver alginate. He is undergoing hyperbarics but did not dive today 12/18; in for his obligatory first total contact cast change. Unfortunately there was pus coming from the undermining area around his fifth metatarsal head. This was cultured but will preclude reapplication of a cast. He is seen in conjunction with HBO 12/24; patient had staph lugdunensis in the wound in the undermining area laterally last time. We put him on doxycycline which should have covered this. The wound looks better today. I am going to give him another week of doxycycline before reattempting the total contact cast 12/31; the patient is completing antibiotics. Hemorrhagic debris in the distal part of the wound with some undermining distally. He also had hyper granulation. Extensive  debridement with a #5 curette. The infected area that was on the lateral part of the fifth met head is closed over. I do not think he needs any more antibiotics. Patient was seen prior to HBO. Preparations for a total contact cast were made in the cast will be placed post hyperbarics 04/11/19; once again the patient arrives today without complaint. He had been in a cast all week noted that he had heavy drainage this week. This resulted in large raised areas of macerated tissue around the wound 1/14; wound bed looks better slightly smaller. Hydrofera Blue has been changing himself. He had a heavy drainage last week which caused a lot of maceration around the wound so I took him out of a total contact cast he says the drainage is actually better this week He is seen today in conjunction with HBO 1/21; returns to clinic. He was up in [place] for a day or 2 attending a funeral. He comes back in with the wound larger and with a large area of exposed bone. He had osteomyelitis and septic arthritis of the fifth left metatarsal head while he was in hospital. He received IV antibiotics in the hospital for a prolonged period of time then 3 weeks of Augmentin. Subsequently I gave him 2 weeks of doxycycline for more superficial wound infection. When I saw this last week the wound was smaller the surface of the wound looks satisfactory. 1/28; patient missed hyperbarics today. Bone biopsy I did last time showed  Enterococcus faecalis and Staphylococcus lugdunensis . He has a wide area of exposed bone. We are going to use silver alginate as of today. I had another ethical discussion with the patient. This would be recurrent osteomyelitis he is already received IV antibiotics. In this situation I think the likelihood of healing this is low. Therefore I have recommended a ray amputation and with the patient's agreement I have referred him to Dr. Victorino Dike. The other issue is that his compliance with hyperbarics has been  minimal because of his work schedule and given his underlying decision I am going to stop this today READMISSION 10/24/2019 MRI 09/29/2019 left foot IMPRESSION: 1. Apparent skin ulceration inferior and lateral to the 5th metatarsal base with underlying heterogeneous T2 signal and enhancement in the subcutaneous fat. Small peripherally enhancing fluid collections along the plantar and lateral aspects of the 5th metatarsal base suspicious for abscesses. 2. Interval amputation through the mid 5th metatarsal with nonspecific low-level marrow edema and enhancement. Given the proximity to the adjacent soft tissue inflammatory changes, osteomyelitis cannot be excluded. 3. The additional bones appear unremarkable. MRI 09/29/2019 right foot IMPRESSION: 1. Soft tissue ulceration lateral to the 5th MTP joint. There is low-level T2 hyperintensity within the 4th and 5th metatarsal heads and adjacent proximal phalanges without abnormal T1 signal or cortical destruction. These findings are nonspecific and could be seen with early marrow edema, hyperemia or early osteomyelitis. No evidence of septic joint. 2. Mild tenosynovitis and synovial enhancement associated with the extensor digitorum tendons at the level of the midfoot. 3. Diffuse low-level muscular T2 hyperintensity and enhancement, most consistent with diabetic myopathy. LEFT FOOT BONE Methicillin resistant staphylococcus aureus Staphylococcus lugdunensis MIC MIC CIPROFLOXACIN >=8 RESISTANT Resistant <=0.5 SENSI... Sensitive CLINDAMYCIN <=0.25 SENS... Sensitive >=8 RESISTANT Resistant ERYTHROMYCIN >=8 RESISTANT Resistant >=8 RESISTANT Resistant GENTAMICIN <=0.5 SENSI... Sensitive <=0.5 SENSI... Sensitive Inducible Clindamycin NEGATIVE Sensitive NEGATIVE Sensitive OXACILLIN >=4 RESISTANT Resistant 2 SENSITIVE Sensitive RIFAMPIN <=0.5 SENSI... Sensitive <=0.5 SENSI... Sensitive TETRACYCLINE <=1 SENSITIVE Sensitive <=1 SENSITIVE  Sensitive TRIMETH/SULFA <=10 SENSIT Sensitive <=10 SENSIT Sensitive ... Marland Kitchen.. VANCOMYCIN 1 SENSITIVE Sensitive <=0.5 SENSI... Sensitive Dipalma, [name] ([id number]) [id number] Page 15 of 25 Right foot bone . Component 3 wk ago Specimen Description BONE Special Requests RIGHT 4 METATARSAL SAMPLE B Gram Stain NO WBC SEEN NO ORGANISMS SEEN Culture RARE METHICILLIN RESISTANT STAPHYLOCOCCUS AUREUS NO ANAEROBES ISOLATED Performed at [facility], [address], [place], [place] [address] Report Status 10/08/2019 FINAL Organism ID, Bacteria METHICILLIN RESISTANT STAPHYLOCOCCUS AUREUS Resulting Agency [facility] Susceptibility Methicillin resistant staphylococcus aureus MIC CIPROFLOXACIN >=8 RESISTANT Resistant CLINDAMYCIN <=0.25 SENS... Sensitive ERYTHROMYCIN >=8 RESISTANT Resistant GENTAMICIN <=0.5 SENSI... Sensitive Inducible Clindamycin NEGATIVE Sensitive OXACILLIN >=4 RESISTANT Resistant RIFAMPIN <=0.5 SENSI... Sensitive TETRACYCLINE <=1 SENSITIVE Sensitive TRIMETH/SULFA <=10 SENSIT Sensitive ... VANCOMYCIN 1 SENSITIVE Sensitive This is a patient we had in clinic earlier this year with a wound over his left fifth metatarsal head. 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 Victorino Dike on 05/16/2019. He seems to have developed problems with wounds on his bilateral feet in June 2021 although he may have had problems earlier than this. He was in an urgent care with a right foot ulcer on 09/26/2019 and given a course of doxycycline. This was apparently  after having trouble getting into see orthopedics. He was seen by podiatry on 09/28/2019 noted to have bilateral lower extremity  ulcers including the left lateral fifth metatarsal base and the right subfifth met head. It was noted that had purulent drainage at that time. He required hospitalization from 6/20 through 7/2. This was because of worsening right foot wounds. He underwent bilateral operative incision and drainage and bone biopsies bilaterally. Culture results are listed above. He has been referred back to clinic by Dr. Ardelle Anton of podiatry. He is also followed by Dr. Orvan Falconer who saw him yesterday. He was discharged from hospital on Zyvox Flagyl and Levaquin and yesterday changed to doxycycline Flagyl and Levaquin. His inflammatory markers on 6/26 showed a sedimentation rate of 129 and a C-reactive protein of 5. This is improved to 14 and 1.3 respectively. This would indicate improvement. ABIs in our clinic today were 1.23 on the right and 1.20 on the left 11/01/2019 on evaluation today patient appears to be doing fairly well in regard to the wounds on his feet at this point. Fortunately there is no signs of active infection at this time. No fevers, chills, nausea, vomiting, or diarrhea. He currently is seeing infectious disease and still under their care at this point. Subsequently he also has both wounds which she has not been using collagen on as he did not receive that in his packaging he did not call us and let us know that. Apparently that just was missed on the order. Nonetheless we will get that straightened out today. 8/9-Patient returns for bilateral foot wounds, using Prisma with hydrogel moistened dressings, and the wounds appear stable. Patient using surgical shoes, avoiding much pressure or weightbearing as much as possible 8/16; patient has bilateral foot wounds. 1 on the right lateral foot proximally the other is on the left mid lateral foot. Both required debridement of callus and thick skin around the wounds. We have been using silver collagen 8/27; patient has bilateral lateral foot wounds. The area  on the left substantially surrounded by callus and dry skin. This was removed from the wound edge. The underlying wound is small. The area on the right measured somewhat smaller today. We've been using silver collagen the patient was on antibiotics for underlying osteomyelitis in the left foot. Unfortunately I did not update his antibiotics during today's visit. 9/10 I reviewed Dr. Blair Dolphin last notes he felt he had completed antibiotics his inflammatory markers were reasonably well controlled. He has a small wound on the lateral left foot and a tiny area on the right which is just above closed. He is using Hydrofera Blue with border foam he has bilateral surgical shoes 9/24; 2 week f/u. doing well. right foot is closed. left foot still undermined. 10/14; right foot remains closed at the fifth met head. The area over the base of the left fifth metatarsal has a small open area but considerable undermining towards the plantar foot. Thick callus skin around this suggests an adequate pressure relief. We have talked about this. He says he is going to go back into his cam boot. I suggested a total contact cast he did not seem enamored with this suggestion 10/26; left foot base of the fifth metatarsal. Same condition as last time. He has skin over the area with an open wound however the skin is not adherent. He went to see Dr. Loreta Ave who did an x-ray and culture of his foot I have not reviewed the x-ray but the patient was not told anything.  He is on doxycycline 11/11; since the patient was last here he was in the emergency room on 10/30 he was concerned about swelling in the left foot. They did not do any cultures or x-rays. They changed his antibiotics to cephalexin. Previous culture showed group B strep. The cephalexin is appropriate as doxycycline has less than predictable coverage. Arrives in clinic today with swelling over this area under the wound. He also has a new wound on the right fifth metatarsal  head 11/18; the patient has a difficult wound on the lateral aspect of the left fifth metatarsal head. The wound was almost ballotable last week I opened it slightly expecting to see purulence however there was just bleeding. I cultured this this was negative. X-ray unchanged. We are trying to get an MRI but I am not sure were going to be able to get this through his insurance. He also has an area on the right lateral fifth metatarsal head this looks healthier 12/3; the patient finally got our MRI. Surprisingly this did not show osteomyelitis. I did show the soft tissue ulceration at the lateral plantar aspect of the fifth metatarsal base with a tiny residual 6 mm abscess overlying the superficial fascia I have tried to culture this area I have not been able to get this to grow anything. Nevertheless the protruding tissue looks aggravated. I suspect we should try to treat the underlying "abscess with broad-spectrum antibiotics. I am going to start him on Levaquin and Flagyl. He has much less edema in his legs and I am going to continue to wrap his legs and see him weekly 12/10. I started Levaquin and Flagyl on him last week. He just picked up the Flagyl apparently there was some delay. The worry is the wound on the left fifth metatarsal base which is substantial and worsening. His foot looks like he inverts at the ankle making this a weightbearing surface. Certainly no improvement in fact I think the measurements of this are somewhat worse. We have been using 12/17; he apparently just got the Levaquin yesterday this is 2 weeks after the fact. He has completed the Flagyl. The area over the left fifth metatarsal base still has protruding granulation tissue although it does not look quite as bad as it did some weeks ago. He has severe bilateral lymphedema although we have not been treating him for wounds on his legs this is definitely going to require compression. There was so much edema in the left I did  not wish to put him in a total contact cast today. I am going to increase his compression from 3-4 layer. The area on the right lateral fifth met head actually look quite good and superficial. 12/23; patient arrived with callus on the right fifth met head and the substantial hyper granulated callused wound on the base of his fifth metatarsal. He says he is completing his Levaquin in 2 days but I do not think that adds up with what I gave him but I will have to double check this. We are using Hydrofera Blue on both areas. My plan is to put the left leg in a cast the week after New Year's 04/06/2020; patient's wounds about the same. Right lateral fifth metatarsal head and left lateral foot over the base of the fifth metatarsal. There is undermining on the left lateral foot which I removed before application of total contact cast continuing with Hydrofera Blue new. Grossberg, [name] ([id number]) [id number] Page 16 of 25 Patient tells me he  was seen by endocrinology today lab work was done [Dr. Kerr]. Also wondering whether he was referred to cardiology. I went over some lab work from previously does not have chronic renal failure certainly not nephrotic range proteinuria he does have very poorly controlled diabetes but this is not his most updated lab work. Hemoglobin A1c has been over 11 1/10; the patient had a considerable amount of leakage towards mid part of his left foot with macerated skin however the wound surface looks better the area on the right lateral fifth met head is better as well. I am going to change the dressing on the left foot under the total contact cast to silver alginate, continue with Hydrofera Blue on the right. 1/20; patient was in the total contact cast for 10 days. Considerable amount of drainage although the skin around the wound does not look too bad on the left foot. The area on the right fifth metatarsal head is closed. Our nursing staff reports  large amount of drainage out of the left lateral foot wound 1/25; continues with copious amounts of drainage described by our intake staff. PCR culture I did last week showed E. coli and Enterococcus faecalis and low quantities. Multiple resistance genes documented including extended spectrum beta lactamase, MRSA, MRSE, quinolone, tetracycline. The wound is not quite as good this week as it was 5 days ago but about the same size 2/3; continues with copious amounts of malodorous drainage per our intake nurse. The PCR culture I did 2 weeks ago showed E. coli and low quantities of Enterococcus. There were multiple resistance genes detected. I put Neosporin on him last week although this does not seem to have helped. The wound is slightly deeper today. Offloading continues to be an issue here although with the amount of drainage she has a total contact cast is just not going to work 2/10; moderate amount of drainage. Patient reports he cannot get his stocking on over the dressing. I told him we have to do that the nurse gave him suggestions on how to make this work. The wound is on the bottom and lateral part of his left foot. Is cultured predominantly grew low amounts of Enterococcus, E. coli and anaerobes. There were multiple resistance genes detected including extended spectrum beta lactamase, quinolone, tetracycline. I could not think of an easy oral combination to address this so for now I am going to do topical antibiotics provided by [facility] I think the main agents here are vancomycin and an aminoglycoside. We have to be able to give him access to the wounds to get the topical antibiotic on 2/17; moderate amount of drainage this is unchanged. He has his Keystone topical antibiotic against the deep tissue culture organisms. He has been using this and changing the dressing daily. Silver alginate on the wound surface. 2/24; using Keystone antibiotic with silver alginate on the top. He had too much  drainage for a total contact cast at one point although I think that is improving and I think in the next week or 2 it might be possible to replace a total contact cast I did not do this today. In general the wound surface looks healthy however he continues to have thick rims of skin and subcutaneous tissue around the wide area of the circumference which I debrided 06/04/2020 upon evaluation today patient appears to be doing well in regard to his wound. I do feel like he is showing signs of improvement. There is little bit of callus and dead  tissue around the edges of the wound as well as what appears to be a little bit of a sinus tract that is off to the side laterally I would perform debridement to clear that away today. 3/17; left lateral foot. The wound looks about the same as I remember. Not much depth surface looks healthy. No evidence of infection 3/25; left lateral foot. Wound surface looks about the same. Separating epithelium from the circumference. There really is no evidence of infection here however not making progress by my view 3/29; left lateral foot. Surface of the wound again looks reasonably healthy still thick skin and subcutaneous tissue around the wound margins. There is no evidence of infection. One of the concerns being brought up by the nurses has again the amount of drainage vis--vis continued use of a total contact cast 4/5; left lateral foot at roughly the base of the fifth metatarsal. Nice healthy looking granulated tissue with rims of epithelialization. The overall wound measurements are not any better but the tissue looks healthy. The only concern is the amount of drainage although he has no surrounding maceration with what we have been doing recently to absorb fluid and protect his skin. He also has lymphedema. 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 He states that the kerlix/coban under the TCC rolled down and could not get it back up. He has been using an offloading scooter and has somehow hurt his right foot using this device. This happened last week. He states that the side of his right foot developed a blister and opened. The top of his foot also has a few small open wounds he thinks is due to his socks rubbing in his shoes. He has not been using any dressings to the wound. He denies purulent drainage, fever/chills or erythema to the wounds. 4/22; patient presents for 1 week follow-up. He developed new wounds to the right foot that were evaluated at last clinic visit. He continues to have a total contact cast to the left leg and he reports no issues. He has been using silver collagen to the right foot wounds with no issues. He denies purulent drainage, fever/chills or erythema to the right foot wounds. He has no complaints today 4/25; patient presents for 1 week follow-up. He has a total contact cast of the left leg and reports no issues. He has been using silver alginate to the right foot wound. He denies purulent drainage, fever/chills or erythema to the right foot wounds. 5/2 patient presents for 1 week follow-up. T contact cast on the left. The wound which is on the base of the plantar foot at the base of the fifth metatarsal otal actually looks quite good and dimensions continue to gradually contract. HOWEVER the area on the right lateral fifth metatarsal head is much larger than what I remember from 2 weeks ago. Once more is he has significant levels of hypergranulation. Noteworthy that he had this same hyper granulated response on his wound on the left foot at one point in time. So much so that he I thought there was an underlying fluid collection. Based on this I think this just needs  debridement. 5/9; the wound on the left actually continues to be gradually smaller with  a healthy surface. Slight amount of drainage and maceration of the skin around but not too bad. However he has a large wound over the right fifth metatarsal head very much in the same configuration as his left foot wound was initially. I used silver nitrate to address the hyper granulated tissue no mechanical debridement 5/16; area on the left foot did not look as healthy this week deeper thick surrounding macerated skin and subcutaneous tissue.  The area on the right foot fifth met head was about the same  The area on the right ankle that we identified last week is completely broken down into an open wound presumably a stocking rubbing issue 5/23; patient has been using a total contact cast to the left side. He has been using silver alginate underneath. He has also been using silver alginate to the right foot wounds. He has no complaints today. He denies any signs of infection. 5/31; the left-sided wound looks some better measure smaller surface granulation looks better. We have been using silver alginate under the total contact cast  The large area on his right fifth met head and right dorsal foot look about the same still using silver alginate 6/6; neither side is good as I was hoping although the surface area dimensions are better. A lot of maceration on his left and right foot around the wound edge. Area on the dorsal right foot looks better. He says he was traveling. I am not sure what does the amount of maceration around the plantar wounds may be drainage issues 6/13; in general the wound surfaces look quite good on both sides. Macerated skin and raised edges around the wound required debridement although in general especially on the left the surface area seems improved.  The area on the right dorsal ankle is about the same I thought this would not be such a problem to close 6/20; not much change in  either wound although the one on the right looks a little better. Both wounds have thick macerated edges to the skin requiring debridements. We have been using silver alginate. The area on the dorsal right ankle is still open I thought this would be closed. 6/28; patient comes in today with a marked deterioration in the right foot wound fifth met head. Wide area of exposed bone this is a drastic change from last time. The area on the left there we have been casting is stagnant. We have been using silver alginate in both wound areas. 7/5; bone culture I did for PCR last time was positive for Pseudomonas, group B strep, Enterococcus and Staph aureus. There was no suggestion of methicillin resistance or ampicillin resistant genes. This was resistant to tetracycline however He comes into the clinic today with the area over his right plantar fifth metatarsal head which had been doing so well 2 weeks ago completely necrotic feeling bone. I do not know that this is going to be salvageable. The left foot wound is certainly no smaller but it has a better surface and is superficial. 7/8; patient called in this morning to say that his total contact cast was rubbing against his foot. He states he is doing fine overall. He denies signs of infection. 7/12; continued deterioration in the wound over the right fifth metatarsal head crumbling bone. This is not going to be salvageable. The patient agrees and wants to be referred to Dr. Victorino Dike which we will attempt to arrange as soon as possible. I am going to continue him on antibiotics as  long as that takes so I will renew FIELDHOUSE, [name] ([id number]) [id number] Page 17 of 25 those today. The area on the left foot which is the base of the fifth metatarsal continues to look somewhat better. Healthy looking tissue no depth no debridement is necessary here. 7/20; the patient was kindly seen by Dr. Victorino Dike of orthopedics on 10/19/2020. He agreed  that he needed a ray amputation on the right and he said he would have a look at the fourth as well while he was intraoperative. Towards this end we have taken him out of the total contact cast on the left we will put him in a wrap with Hydrofera Blue. As I understand things surgery is planned for 7/21 7/27; patient had his surgery last Thursday. 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 Apparently everything went well we did not still disturb that today The area on the left foot actually looks quite good. He has been much less mobile which probably explains this he did not seem to do well in the total contact cast secondary to drainage and maceration I think. 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 Fortunately there is no evidence of active infection at this time. No fevers, chills, nausea, vomiting, or diarrhea. Overall I think that he is actually doing extremely well. Nonetheless I do believe that he is staying off of this more following the surgery in his right foot that is the reason the left is doing so great. 8/16; left plantar foot wound. This looks smaller than the last time I saw this he is using Hydrofera Blue. The surgical wound on the right foot is being followed by Dr. Victorino Dike we did not look at this today. He has surgical shoes on both feet 8/23; left plantar foot wound not as good this week. Surrounding macerated skin and subcutaneous tissue everything looks moist and wet. I do not think he is offloading this adequately. He is using a surgical shoe Apparently the right foot surgical wound is not open although I did not check his foot 8/31; left plantar foot lateral aspect. Much improved this week. He has no maceration. Some improvement in the surface area of the wound but most impressively the depth is come in we are using silver alginate. The patient is a Product/process development scientist. He is asked that we write him a letter so he can go back to work. I have also tried to see if we can write something that will allow him to limit the amount of time that he is on his foot at work. 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 Hopefully the latter can be avoided 9/6; I believe the patient missed an appointment last week. He arrives in today with a wound looking roughly the same certainly no better. Undermining laterally and also inferiorly. We used molecuLight today in training with the patient's permission.. We are using silver alginate 9/21 wound is measuring bigger this week although this may have to do with the aggressive circumferential debridement last week in response to the blush fluorescence on the MolecuLight. Culture I did last week showed significant MSSA and E. coli. I put him on Augmentin but he has not started it yet. We are also going to send this for compounded antibiotics at [facility]. There is no  evidence of systemic infection 9/29; silver alginate. His Keystone arrived. He is completing Augmentin in 2 days. Offloading in a cam boot. Moderate drainage per our intake staff 10/5; using silver alginate. He has been using his [place]. He has completed his Augmentin. Per our intake nurse still a lot of drainage, far too much to consider a total contact cast. Wound measures about the same. He had the same undermining area that I defined last week from a roughly 11-3. I remove this today 10/12; using silver alginate he is using the [place]. He comes in for a nurse visit hence we are applying Jodie Echevaria twice a week. Measuring slightly better today and less notable drainage. Extensive debridement of the wound edge last time 10/18; using topical Keystone and silver alginate and a soft cast. Wound measurements about the same. Drainage was through his soft cast. We are changing this twice a week  Tuesdays and Friday 10/25; comes in with moderate drainage. Still using Keystone silver alginate and a soft cast. Wound dimensions completely the same.He has a lot of edema in the left leg he has lymphedema. Asking for [name] to consider wrapping him as he cannot get his stocking on over the soft cast 11/2; comes in with moderate to large drainage slightly smaller in terms of width we have been using [place]. His wound looks satisfactory but not much improvement 11/4; patient presents today for obligatory cast change. Has no issues or complaints today. He denies signs of infection. 11/9; patient traveled this weekend to [place], was on the cast quite a bit. Staining of the cast with black material from his walking boot. Drainage was not quite as bad as we feared. Using silver alginate and Keystone 11/16; we do not have size for cast therefore we have been putting a soft cast on him since the change on Friday. Still a significant amount of drainage necessitating changing twice a week. We have been using the Keystone at cast changes either hard or soft as well as silver alginate Comes in the clinic with things actually looking fairly good improvement in width. He says his offloading is about the same 02/24/2021 upon evaluation today patient actually comes back in and is doing excellent in regard to his foot ulcer this is significantly smaller even compared to the last visit. The soft cast seems to have done extremely well for him which is great news. I do not see any signs of infection minimal debridement will be needed today. 11/30; left lateral foot much improved half a centimeter improvement in surface area. No evidence of infection. He seems to be doing better with the soft cast in the TCC therefore we will continue with this. He comes back in later in the week for a change with the nurses. This is due to drainage 12/6; no improvement in dimensions. Under illumination some debris on the surface we have  been using silver alginate, soft cast. If there is anything optimistic here he seems to have have less drainage 12/13. Dimensions are improved both length and width and slightly in depth. Appears to be quite healthy today. Raised edges of this thick skin and callus around the edges however. He is in a soft cast were bringing him back once for a change on Friday. Drainage is better 12/20. Dimensions are improved. He still has raised edges of thick skin and callus around the edges. We are using a soft cast 12/28; comes in today with thick callus around the wound. Using silver under alginate under  a soft cast. I do not think there is much improvement in any measurement 2023 04/06/2021; patient was put in a total contact cast. Unfortunately not much change in surface area 1/10; not much different still thick callus and skin around the edge in spite of the total contact cast. This was just debrided last week we have been using the [facility] compounded antibiotic and silver alginate under a total contact cast 1/18 the patient's wound on the left side is doing nicely. smaller HOWEVER he comes in today with a wound on the right foot laterally. blister most likely serosangquenous drainage 1/24; the patient continues to do well in terms of the plantar left foot which is continued to contract using silver alginate under the total contact cast HOWEVER the right lateral foot is bigger with denuded skin around the edges. I used pickups and a #15 scalpel to remove this this looks like the remanence of a large blister. Cannot rule out infection. Culture in this area I did last week showed Staphylococcus lugdunensis few colonies. I am going to try to address this with his Jodie Echevaria antibiotic that is done so well on the left having linezolid and this should cover the staph 2/1; the patient's wound on his left foot which was the original plantar foot wound thick skin and eschar around the edges even in the total contact  cast but the wound surface does not look too bad The real problem is on how his right lateral foot at roughly the base of the fifth metatarsal. The wound is completely necrotic more worrisome than that there is swelling around the edges of this. We have been using silver alginate on both wounds and Keystone on the right foot. Unfortunately I think he is going to require systemic antibiotics while we await cultures. He did not get the x-ray done that we ordered last week [lost the prescription Hare, [name] ([id number]) [id number] Page 18 of 25 2/7; disappointingly in the area on the left foot which we are treating with a total contact cast is still not closed although it is much smaller. He continues to have a lot of callus around the wound edge. -Right lateral foot culture I did last week was negative x-ray also negative for osteomyelitis. 2/15: TCC silver alginate on the left and silver alginate on the right lateral. No real improvement in either area 05/26/2021: T oday, the wounds are roughly the same size as at his previous visit, post-debridement. He continues to endorse fairly substantial drainage, particularly on the right. He has been in a total contact cast on the left. There is still some callus surrounding this lesion. On the right, the periwound skin is quite macerated, along with surrounding callus. The center of the right-sided wound also has some dark, densely adherent material, which is very difficult to remove. 06/02/2021: Today, both wounds are slightly smaller. He has been using zinc oxide ointment around the right ulcer and the degree of maceration has improved markedly. There continues to be an area of nonviable tissue in the center of the right sided ulcer. The left-sided wound, which has been in the total contact cast. Appears clean and the degree of callus around it is less than previously. 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 He had to take the stairs and both wounds have increased in size. The left foot, which has been in a total contact cast, has developed a tunnel tracking to the lateral  aspect of his foot. The nonviable tissue in the center of the right-sided ulcer remains recalcitrant to debridement. There is significant undermining surrounding the entirety of the left sided wound. 06/16/2021: The elevator at school has been fixed and the patient has been able to avoid putting as much weight on his wounds over the past week. We converted the left foot wound into a single lesion today, but despite this, the wound is actually smaller. The base is healthy with limited periwound callus. On the right, the central necrotic area is still present. He continues to be quite macerated around the right sided wound, despite applying barrier cream. This does, however, have the benefit of softening the callus to make it more easily removable. 06/23/2021: Today, the left wound is smaller. The lateral aspect that had opened up previously is now closed. The wound base has a healthy bed of granulation tissue and minimal slough. Unfortunately, on the right, the wound is larger and continues to be fairly macerated. He has also reopened the wound at his right ankle. He thinks this is due to the gait he has adopted secondary to his total contact cast and boot. 06/30/2021: T oday, both wounds are a little bit larger. The lateral aspect on the left has remained closed. He continues to have significant periwound maceration. The culture that I took from the right sided wound grew a population of bacteria that is not covered by his current [facility] antibiotic. The center of the right- sided wound continues to appear necrotic with nonviable fat. It probes deeper today, but does not reach bone. 07/07/2021: The periwound maceration is a little bit less today. The right lateral foot wound has some areas  that appear more viable and the necrotic center also looks a little bit better. The wound on the dorsal surface of his right foot near the ankle is contracting and the surface appears healthy. The left plantar wound surface looks healthy, but there is some new undermining on the medial portion. He did get his new Keystone antibiotic and began applying that to the right foot wound on Saturday. 07/14/2021: The intake nurse reported substantial drainage from his wounds, but the periwound skin actually looks better than is typical for him. The wound on the dorsal surface of his right foot near the ankle is smaller and just has a small open area underneath some dried eschar. The left plantar wound surface looks healthy and there has been no significant accumulation of callus. The right lateral foot wound looks quite a bit better, with the central portion, which typically appears necrotic, looking more viable albeit pale. 07/22/2021: His left foot is extremely macerated today. The wound is about the same size. The wound on the dorsal surface of his right foot near the ankle had closed, but he traumatized it removing the dressing and there is a tiny skin tear in that location. The right lateral foot wound is bigger, but the surface appears healthy. 07/30/2021: The wound on the dorsal surface of his right foot near the ankle is closed. The right lateral foot wound again is a little bit bigger due to some undermining. The periwound skin is in better condition, however. He has been applying zinc oxide. The wound surface is a little bit dry today. On the left, he does not have the substantial maceration that we frequently see. The wound itself is smaller and has a clean surface. 08/06/2021: Both wounds seem to have deteriorated over the past week. The right lateral foot wound  has a dry surface but the periwound is boggy.. Overall wound dimensions are about the same. On the left, the wound is about the same size, but  there is more undermining present underneath periwound callus. 08/13/2021: The right sided wound looks about the same, but on the left there has been substantial deterioration. The undermining continues to extend under periwound callus. Once this was removed, substantial extension of the wound was present. There is no odor or purulent drainage but clearly the wounds have broken down. 08/20/2021: The wounds look about the same today. He has been out of his total contact cast and has just been changing the dressings using topical Keystone with PolyMem Ag, Kerlix and Ace bandages. The wound on the top of his right ankle has reopened but this is quite small. There was a little bit of purulent material that I expressed when examining this wound. 08/24/2021: After the aggressive debridement I performed at his last visit, the wounds actually look a little bit better today. They are smaller with the exception of the wound on the top of his right ankle which is a little bit bigger as some more skin pulled off when he was changing his dressing. We are using topical Keystone with PolyMem Ag Kerlix and Ace bandages. 09/02/2021: There has been really no change to any of his wounds. 09/16/2021: The patient was hospitalized last week with nausea, vomiting, and dehydration. He says that while he was in the hospital, his wounds were not really addressed properly. T oday, both plantar foot wounds are larger and the periwound skin is macerated. The wound on the dorsum of his right foot has a scab on the top. The right foot now has a crater where previously he had had nonviable fat. It looks as though this simply died and fell out. The periwound callus is wet. 09/24/2021: His wounds have deteriorated somewhat since his last visit. The wound on the dorsum of his right foot near his ankle is larger and has more nonviable tissue present. The crater in his right foot is even deeper; I cannot quite palpate or probe to bone but I am  sure it is close. The wound on his left plantar foot has an odd boggy area in the center that almost feels as though it has fluid within it. He has run out of his topical Keystone antibiotic. We are using silver alginate on his wounds. 09/29/2021: He has developed a new wound on the dorsum of his left foot near his ankle. He says he thinks his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 Follow-up here in 1 week. Electronic Signature(s) Signed: 10/26/2022 10:07:43 AM By: Pearletha Alfred Signed: 10/26/2022 11:31:44 AM By: Duanne Guess MD FACS Previous Signature: 09/07/2022 9:51:14 AM Version By: Duanne Guess MD FACS Entered By: Pearletha Alfred on 10/26/2022 10:07:43 -------------------------------------------------------------------------------- HxROS Details Patient Name: Date of Service: [name], [name] 09/07/2022 8:15 A M Medical Record Number: [id number] Patient Account Number: [id number] Date of Birth/Sex: Treating RN: [birth year] (36 y.o. M) Primary Care Provider: [name] Other Clinician: Referring Provider: Treating Provider/Extender: [name] in Treatment: 149 Information Obtained From Patient Eyes Mcveigh,  [name] ([id number]) [id number] Page 23 of 25 Medical History: Negative for: Cataracts; Glaucoma; Optic Neuritis Ear/Nose/Mouth/Throat Medical History: Negative for: Chronic sinus problems/congestion; Middle ear problems Hematologic/Lymphatic Medical History: Negative for: Anemia; Hemophilia; Human Immunodeficiency Virus; Lymphedema; Sickle Cell Disease Respiratory Medical History: Negative for: Aspiration; Asthma; Chronic Obstructive Pulmonary Disease (COPD); Pneumothorax; Sleep Apnea; Tuberculosis Cardiovascular Medical History: Negative for: Angina; Arrhythmia; Congestive Heart Failure; Coronary Artery Disease; Deep Vein Thrombosis; Hypertension; Hypotension; Myocardial Infarction; Peripheral Arterial Disease; Peripheral Venous Disease; Phlebitis; Vasculitis Gastrointestinal Medical History: Negative for: Cirrhosis ; Colitis; Crohns; Hepatitis A; Hepatitis B; Hepatitis C Endocrine Medical History: Positive for: Type II Diabetes Negative for: Type I Diabetes Time with diabetes: 8 Treated with: Insulin Blood sugar tested every day: No Immunological Medical History: Negative for: Lupus Erythematosus; Raynauds; Scleroderma Integumentary (Skin) Medical History: Negative for: History of Burn Musculoskeletal Medical History: Negative for: Gout; Rheumatoid Arthritis; Osteoarthritis; Osteomyelitis Neurologic Medical History: Negative for: Dementia; Neuropathy; Quadriplegia; Paraplegia; Seizure Disorder Oncologic Medical History: Negative for: Received Chemotherapy; Received Radiation Psychiatric Medical History: Negative for: Anorexia/bulimia; Confinement Anxiety Immunizations Pneumococcal Vaccine: Received Pneumococcal Vaccination: No Implantable Devices None Hospitalization / Surgery History Greaves, [name] ([id number]) [id number] Page 24 of 25 Type of Hospitalization/Surgery 11/1-11/06/2018- sepsis foot  infection 11/4-11/5 02 sats low respiratory distress Family and Social History Cancer: No; Diabetes: No; Hereditary Spherocytosis: No; Hypertension: No; Kidney Disease: No; Lung Disease: No; Seizures: No; Stroke: No; Thyroid Problems: No; Tuberculosis: No; Never smoker; Marital Status - Single; Alcohol Use: Rarely; Drug Use: No History; Caffeine Use: Never; Financial Concerns: No; Food, Clothing or Shelter Needs: No; Support System Lacking: No; Transportation Concerns: No Electronic Signature(s) Signed: 09/07/2022 12:20:18 PM By: Duanne Guess MD FACS Entered By: Duanne Guess on 09/07/2022 09:45:37 -------------------------------------------------------------------------------- SuperBill Details Patient Name: Date of Service: [name], [name] 09/07/2022 Medical Record Number: [id number] Patient Account Number: [id number] Date of Birth/Sex: Treating RN: 16-Apr-1985 (36 y.o. M) Primary Care Provider: [name] Other Clinician: Referring Provider: Treating Provider/Extender: [name] in Treatment: 149 Diagnosis Coding ICD-10 Codes Code Description [phone] 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one L97.522 Non-pressure chronic ulcer of other part of left foot with  fat layer exposed E11.621 Type 2 diabetes mellitus with foot ulcer Facility Procedures : CPT4 Code: 98119147 Description: 11042 - DEB SUBQ TISSUE 20 SQ CM/< ICD-10 Diagnosis Description L97.522 Non-pressure chronic ulcer of other part of left foot with fat layer exposed Modifier: Quantity: 1 : CPT4 Code: [id number] Description: 11044 - DEB BONE 20 SQ CM/< ICD-10 Diagnosis Description 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ne Modifier: Quantity: 1 Physician Procedures : CPT4: Description Modifier Code [id number] 99214 - WC PHYS LEVEL 4 - EST PT 25 ICD-10 Diagnosis Description L97.524 Non-pressure chronic ulcer of  other part of left foot with necrosis of bone L97.522 Non-pressure chronic ulcer of other part of left foot  with fat layer exposed E11.621 Type 2 diabetes mellitus with foot ulcer Quantity: 1 : CPT4: 6962952 11042 - WC PHYS SUBQ TISS 20 SQ CM ICD-10 Diagnosis Description L97.522 Non-pressure chronic ulcer of other part of left foot with fat layer exposed Quantity: 1 : CPT4: 8413244 Debridement; bone (includes epidermis, dermis, subQ tissue, muscle and/or fascia, if performed) 1st 20 sqcm or less ICD-10 Diagnosis Description 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ne Quantity: 1 Electronic Signature(s) Signed: 09/07/2022 9:51:36 AM By: Duanne Guess MD FACS [place], [name] ([id number]) By: Duanne Guess MD FACS [id number].pdf Page 25 of 25 Signed: 09/07/2022 9:51:36 AM Entered By: Duanne Guess on 09/07/2022 09:51:33

## 2022-10-26 NOTE — Discharge Instructions (Addendum)
Stay off of left foot; weight bear to the heel Elevate foot Resume all home medications as prior See written instructions   Post Anesthesia Home Care Instructions  Activity: Get plenty of rest for the remainder of the day. A responsible individual must stay with you for 24 hours following the procedure.  For the next 24 hours, DO NOT: -Drive a car -Advertising copywriter -Drink alcoholic beverages -Take any medication unless instructed by your physician -Make any legal decisions or sign important papers.  Meals: Start with liquid foods such as gelatin or soup. Progress to regular foods as tolerated. Avoid greasy, spicy, heavy foods. If nausea and/or vomiting occur, drink only clear liquids until the nausea and/or vomiting subsides. Call your physician if vomiting continues.  Special Instructions/Symptoms: Your throat may feel dry or sore from the anesthesia or the breathing tube placed in your throat during surgery. If this causes discomfort, gargle with warm salt water. The discomfort should disappear within 24 hours.  If you had a scopolamine patch placed behind your ear for the management of post- operative nausea and/or vomiting:  1. The medication in the patch is effective for 72 hours, after which it should be removed.  Wrap patch in a tissue and discard in the trash. Wash hands thoroughly with soap and water. 2. You may remove the patch earlier than 72 hours if you experience unpleasant side effects which may include dry mouth, dizziness or visual disturbances. 3. Avoid touching the patch. Wash your hands with soap and water after contact with the patch.    Next dose of tylenol if needed is due at 4:08pm

## 2022-10-26 NOTE — Anesthesia Postprocedure Evaluation (Signed)
Anesthesia Post Note  Patient: [name]  Procedure(s) Performed: AMPUTATION TOE, 5TH LEFT (Left: Toe) DEBRIDEMENT WOUND LEFT AND RIGHT (Bilateral: Foot) BONE BIOPSY LEFT (Left: Foot) SKIN GRAFT, LEFT AND RIGHT (Bilateral: Foot)     Patient location during evaluation: PACU Anesthesia Type: General Level of consciousness: awake and alert Pain management: pain level controlled Vital Signs Assessment: post-procedure vital signs reviewed and stable Respiratory status: spontaneous breathing Cardiovascular status: stable Anesthetic complications: no   No notable events documented.  Last Vitals:  Vitals:   10/26/22 1502 10/26/22 1515  BP: 135/82 138/77  Pulse: 67 82  Resp: 16 18  Temp:  (!) 36.1 C  SpO2: 97% 97%    Last Pain:  Vitals:   10/26/22 1515  TempSrc:   PainSc: 0-No pain                 Lewie Loron

## 2022-10-26 NOTE — Interval H&P Note (Signed)
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  The various methods of treatment have been discussed with the patient and family. After consideration of risks, benefits and other options for treatment, the patient has consented to  Procedure(s): AMPUTATION TOE, 5TH LEFT (Left) DEBRIDEMENT WOUND LEFT AND RIGHT (Bilateral) BONE BIOPSY LEFT (Left) SKIN GRAFT, LEFT AND RIGHT (Bilateral) as a surgical intervention.  The patient's history has been reviewed, patient examined, no change in status, stable for surgery.  I have reviewed the patient's chart and labs.  Questions were answered to the patient's satisfaction.     Vivi Barrack

## 2022-10-27 ENCOUNTER — Ambulatory Visit: Payer: BC Managed Care – PPO | Admitting: Internal Medicine

## 2022-10-27 ENCOUNTER — Other Ambulatory Visit: Payer: Self-pay

## 2022-10-27 ENCOUNTER — Telehealth: Payer: Self-pay

## 2022-10-27 ENCOUNTER — Encounter (HOSPITAL_BASED_OUTPATIENT_CLINIC_OR_DEPARTMENT_OTHER): Payer: Self-pay | Admitting: Podiatry

## 2022-10-27 ENCOUNTER — Encounter: Payer: BC Managed Care – PPO | Admitting: Podiatry

## 2022-10-27 LAB — AEROBIC/ANAEROBIC CULTURE W GRAM STAIN (SURGICAL/DEEP WOUND)

## 2022-10-27 NOTE — Telephone Encounter (Signed)
The hospital called yesterday afternoon and left a message on the nurse line- they did not have wedge post op shoes big enough to fit the patient - is this still an issue? Or was it resolved yesterday?

## 2022-10-31 ENCOUNTER — Encounter (HOSPITAL_BASED_OUTPATIENT_CLINIC_OR_DEPARTMENT_OTHER): Payer: BC Managed Care – PPO | Admitting: General Surgery

## 2022-10-31 DIAGNOSIS — E11621 Type 2 diabetes mellitus with foot ulcer: Secondary | ICD-10-CM | POA: Diagnosis not present

## 2022-10-31 LAB — GLUCOSE, CAPILLARY
Glucose-Capillary: 94 mg/dL (ref 70–99)
Glucose-Capillary: 131 mg/dL — ABNORMAL HIGH (ref 70–99)

## 2022-10-31 NOTE — Progress Notes (Addendum)
[place], [name] ([id number]) [id number] Page 1 of 2 Visit Report for 10/31/2022 Arrival Information Details Patient Name: Date of Service: [name] 10/31/2022 10:00 A M Medical Record Number: [id number] Patient Account Number: [id number] Date of Birth/Sex: Treating RN: [birth year] (37 y.o. [name], [name] Primary Care [name]: [name] [name]: [name] Referring [name]: Treating [name]: [name] in Treatment: 157 Visit Information History Since Last Visit All ordered tests and consults were completed: Yes Patient Arrived: [name] Added or deleted any medications: No Arrival Time: 13:38 Any new allergies or adverse reactions: No Accompanied By: self Had a fall or experienced change in No Transfer Assistance: None activities of daily living that may affect Patient Identification Verified: Yes risk of falls: Secondary Verification Process Completed: Yes Signs or symptoms of abuse/neglect since last visito No Patient Requires Transmission-Based Precautions: No Hospitalized since last visit: No Patient Has Alerts: No Implantable device outside of the clinic excluding No cellular tissue based products placed in the center since last visit: Pain Present Now: No Electronic Signature(s) Signed: 10/31/2022 1:39:01 PM By: [name] CHT EMT BS , , Entered By: [name] on 10/31/2022 13:39:01 -------------------------------------------------------------------------------- Encounter Discharge Information Details Patient Name: Date of Service: [name], [name] 10/31/2022 10:00 A M Medical Record Number: [id number] Patient Account Number: [id number] Date of Birth/Sex: Treating RN: 25-Aug-1985 (37 y.o. [name] Primary Care [name]: [name] [name]: [name] Referring [name]: Treating [name] [name] in Treatment: 157 Encounter Discharge Information Items Discharge Condition: Stable Ambulatory Status: Ambulatory Discharge Destination: Home Telephoned: Yes Orders Sent: Yes Transportation: Private Auto Accompanied By: self Schedule Follow-up Appointment: No Clinical Summary of Care: Electronic Signature(s) Signed: 10/31/2022 2:13:15 PM By: [name] CHT EMT BS , , Entered By: [name] on 10/31/2022 14:13:14 [place], [name] ([id number]) [id number] Page 2 of 2 -------------------------------------------------------------------------------- Vitals Details Patient Name: Date of Service: [name] 10/31/2022 10:00 A M Medical Record Number: [id number] Patient Account Number: [id number] Date of Birth/Sex: Treating RN: [birth year] (37 y.o. [name] Primary Care [name]: [name] [name]: [name] Referring [name]: Treating [name] [name] in Treatment: 157 Vital Signs Time Taken: 10:01 Temperature (F): 98.2 Height (in): 77 Pulse (bpm): 98 Weight (lbs): 280 Respiratory Rate (breaths/min): 18 Body Mass Index (BMI): 33.2 Blood Pressure (mmHg): 122/75 Capillary Blood Glucose (mg/dl): 94 Reference Range: 80 - 120 mg / dl Electronic Signature(s) Signed: 10/31/2022 1:39:27 PM By: [name] CHT EMT BS , , Entered By: [name] on 10/31/2022 13:39:27

## 2022-10-31 NOTE — Progress Notes (Addendum)
[name], [name] ([id number]) [id number] Page 1 of 2 Visit Report for 10/31/2022 HBO Details Patient Name: Date of Service: [name] [name] 10/31/2022 10:00 A M Medical Record Number: [id number] Patient Account Number: [id number] Date of Birth/Sex: Treating RN: [birth year] (37 y.o. [name] Primary Care [name]: [name] Other Clinician: Haywood [name] Referring Master [name]: Treating [name]/Extender: [name] in Treatment: 157 HBO Treatment Course Details Treatment Course Number: 2 Ordering Lamica [name]: [name] T Treatments Ordered: otal 40 HBO Treatment Start Date: 10/04/2022 HBO Indication: Diabetic Ulcer(s) of the Lower Extremity Wound #3 Left, Lateral, Plantar Foot HBO Treatment Details Treatment Number: 4 Patient Type: Outpatient Chamber Type: Monoplace Chamber Serial #: [id number] Treatment Protocol: 2.0 ATA with 90 minutes oxygen, and no air breaks Treatment Details Compression Rate Down: 1.0 psi / minute De-Compression Rate Up: 1.5 psi / minute Air breaks and breathing Decompress Decompress Compress Tx Pressure Begins Reached periods Begins Ends (leave unused spaces blank) Chamber Pressure (ATA 1 2 ------2 1 ) Clock Time (24 hr) 11:07 11:20 - - - - - - 12:50 13:00 Treatment Length: 113 (minutes) Treatment Segments: 4 Vital Signs Capillary Blood Glucose Reference Range: 80 - 120 mg / dl HBO Diabetic Blood Glucose Intervention Range: <131 mg/dl or >295 mg/dl Type: Time Vitals Blood Pulse: Respiratory Temperature: Capillary Blood Glucose Pulse Action Taken: Pressure: Rate: Glucose (mg/dl): Meter #: Oximetry (%) Taken: Pre 10:01 122/75 98 18 98.2 94 Pre 10:40 131 cleared for HBO Tx per protocol, [name] Treatment Response Treatment Toleration: Well Treatment Completion Status: Treatment Completed without Adverse Event Treatment Notes [name] arrived with normal vital signs  except a blood glucose level of 94 mg/dL. He was given 8 oz of Glucerna per protocol and 8 oz orange juice per order. Patient prepared for treatment. Secondary blood glucose level was 131 mg/dL at 1884. Dr. Lady [name] ordered 8 oz orange juice to be taken in chamber for safety/intervention as needed. Patient tolerated the treatment and subsequent decompression at 1 psi/min. Post-treatment vital signs were within normal range. He was stable upon discharge. Additional Procedure Documentation Tissue Sevierity: Necrosis of bone Physician HBO Attestation: I certify that I supervised this HBO treatment in accordance with Medicare guidelines. A trained emergency response team is readily available per Yes hospital policies and procedures. Continue HBOT as ordered. [name], [name] ([id number]) [id number] Page 2 of 2 Electronic Signature(s) Signed: 10/31/2022 2:14:25 PM By: Max Guess MD FACS Previous Signature: 10/31/2022 2:09:48 PM Version By: [name] , , Entered By: [name] on 10/31/2022 14:14:25 -------------------------------------------------------------------------------- HBO Safety Checklist Details Patient Name: Date of Service: [name], [name] 10/31/2022 10:00 A M Medical Record Number: [id number] Patient Account Number: [id number] Date of Birth/Sex: Treating RN: 05-Nov-1985 (37 y.o. [name] Primary Care [name]: [name] Other Clinician: Karl [name] Referring [name]: Treating [name]/Extender: [name] in Treatment: 157 HBO Safety Checklist Items Safety Checklist Consent Form Signed Patient voided / foley secured and emptied When did you last eato 0630 - Protein Shake, Fruit Last dose of injectable or oral agent Yesterday Morning Ostomy pouch emptied and vented if applicable NA All implantable devices assessed, documented and approved NA Intravenous access site  secured and place Valuables secured Linens and cotton and cotton/polyester blend (less than 51% polyester) Personal oil-based products / skin lotions / body lotions removed Wigs or hairpieces removed NA Smoking or tobacco materials removed NA Books / newspapers / magazines / loose paper removed Cologne, [place],  perfume and deodorant removed Jewelry removed (may wrap wedding band) Make-up removed Hair care products removed Battery operated devices (external) removed Heating patches and chemical warmers removed Titanium eyewear removed Nail polish cured greater than 10 hours NA Casting material cured greater than 10 hours NA Hearing aids removed NA Loose dentures or partials removed NA Prosthetics have been removed NA Patient demonstrates correct use of air break device (if applicable) Patient concerns have been addressed Patient grounding bracelet on and cord attached to chamber Specifics for Inpatients (complete in addition to above) Medication sheet sent with patient NA Intravenous medications needed or due during therapy sent with patient NA Drainage tubes (e.g. nasogastric tube or chest tube secured and vented) NA Endotracheal or Tracheotomy tube secured NA Cuff deflated of air and inflated with saline NA Airway suctioned NA Notes Paper version used prior to treatment start. Electronic Signature(s) Signed: 10/31/2022 1:44:12 PM By: [name] , , Entered By: [name] on 10/31/2022 13:44:12

## 2022-10-31 NOTE — Progress Notes (Signed)
[place], [name] ([id number]) [id number] Page 1 of 1 Visit Report for 10/31/2022 SuperBill Details Patient Name: Date of Service: [name] 10/31/2022 Medical Record Number: [id number] Patient Account Number: [id number] Date of Birth/Sex: Treating RN: December 12, 1985 (37 y.o. 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 L97.512 Non-pressure chronic ulcer of other part of right foot with fat layer exposed E11.621 Type 2 diabetes mellitus with foot ulcer E11.622 Type 2 diabetes mellitus with other skin ulcer Facility Procedures CPT4 Code Description Modifier Quantity [id number] G0277-(Facility Use Only) HBOT full body chamber, , 4 ICD-10 Diagnosis Description 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 M86.172 Other acute osteomyelitis, left ankle and foot E11.622 Type 2 diabetes mellitus with other skin ulcer Physician Procedures Quantity CPT4 Code Description Modifier [id number] 99183 - WC PHYS HYPERBARIC OXYGEN THERAPY 1 ICD-10 Diagnosis Description 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 M86.172 Other acute osteomyelitis, left ankle and foot E11.622 Type 2 diabetes mellitus with other skin ulcer Electronic Signature(s) Signed: 10/31/2022 2:12:33 PM By: Haywood Pao CHT EMT BS , , Signed: 10/31/2022 2:13:52 PM By: Duanne Guess MD FACS Entered By: Haywood Pao on 10/31/2022 14:12:28

## 2022-11-01 ENCOUNTER — Encounter: Payer: Self-pay | Admitting: Internal Medicine

## 2022-11-01 ENCOUNTER — Ambulatory Visit (INDEPENDENT_AMBULATORY_CARE_PROVIDER_SITE_OTHER): Payer: BC Managed Care – PPO | Admitting: Internal Medicine

## 2022-11-01 ENCOUNTER — Encounter (HOSPITAL_BASED_OUTPATIENT_CLINIC_OR_DEPARTMENT_OTHER): Payer: BC Managed Care – PPO | Admitting: General Surgery

## 2022-11-01 ENCOUNTER — Other Ambulatory Visit (HOSPITAL_COMMUNITY): Payer: Self-pay

## 2022-11-01 ENCOUNTER — Other Ambulatory Visit: Payer: Self-pay

## 2022-11-01 VITALS — BP 131/93 | HR 91 | Temp 97.4°F | Ht 77.0 in | Wt 356.0 lb

## 2022-11-01 DIAGNOSIS — E1169 Type 2 diabetes mellitus with other specified complication: Secondary | ICD-10-CM | POA: Diagnosis not present

## 2022-11-01 DIAGNOSIS — E11621 Type 2 diabetes mellitus with foot ulcer: Secondary | ICD-10-CM | POA: Diagnosis not present

## 2022-11-01 DIAGNOSIS — L089 Local infection of the skin and subcutaneous tissue, unspecified: Secondary | ICD-10-CM

## 2022-11-01 DIAGNOSIS — Z794 Long term (current) use of insulin: Secondary | ICD-10-CM

## 2022-11-01 DIAGNOSIS — M86172 Other acute osteomyelitis, left ankle and foot: Secondary | ICD-10-CM | POA: Diagnosis not present

## 2022-11-01 LAB — GLUCOSE, CAPILLARY: Glucose-Capillary: 145 mg/dL — ABNORMAL HIGH (ref 70–99)

## 2022-11-01 MED ORDER — FLUCONAZOLE 200 MG PO TABS
800.0000 mg | ORAL_TABLET | Freq: Every day | ORAL | 5 refills | Status: AC
Start: 2022-11-01 — End: ?

## 2022-11-01 MED ORDER — FLUCONAZOLE 200 MG PO TABS: 400.0000 mg | ORAL_TABLET | Freq: Every day | ORAL | 5 refills | Status: DC

## 2022-11-01 NOTE — Progress Notes (Signed)
[place], [name] ([id number]) [id number] Page 1 of 2 Visit Report for 11/01/2022 Arrival Information Details Patient Name: Date of Service: [name] 11/01/2022 10:00 A M Medical Record Number: [id number] Patient Account Number: [id number] Date of Birth/Sex: Treating RN: [name] (37 y.o. Cline Cools Primary Care Taite Schoeppner: [name] Other Clinician: [name] Referring Lielle Vandervort: Treating Shaughnessy Gethers/Extender: [name] in Treatment: 157 Visit Information History Since Last Visit All ordered tests and consults were completed: Yes Patient Arrived: Ambulatory Added or deleted any medications: No Arrival Time: 10:28 Any new allergies or adverse reactions: No Accompanied By: None Had a fall or experienced change in No Transfer Assistance: None activities of daily living that may affect Patient Identification Verified: Yes risk of falls: Secondary Verification Process Completed: Yes Signs or symptoms of abuse/neglect since last visito No Patient Requires Transmission-Based Precautions: No Hospitalized since last visit: No Patient Has Alerts: No Implantable device outside of the clinic excluding No cellular tissue based products placed in the center since last visit: Pain Present Now: No Electronic Signature(s) Signed: 11/01/2022 1:21:36 PM By: [name] EMT Entered By: [name] on 11/01/2022 13:21:36 -------------------------------------------------------------------------------- Encounter Discharge Information Details Patient Name: Date of Service: [name], [name] 11/01/2022 10:00 A M Medical Record Number: [id number] Patient Account Number: [id number] Date of Birth/Sex: Treating RN: [name] (37 y.o. Cline Cools Primary Care Emarie Paul: [name] Other Clinician: [name] Referring Aiven Kampe: Treating Rhilyn Battle/Extender: [name] in Treatment:  157 Encounter Discharge Information Items Discharge Condition: Stable Ambulatory Status: Ambulatory Discharge Destination: Home Transportation: Private Auto Accompanied By: None Schedule Follow-up Appointment: Yes Clinical Summary of Care: Electronic Signature(s) Signed: 11/01/2022 1:55:58 PM By: [name] EMT Entered By: [name] on 11/01/2022 13:55:58 [place], [name] ([id number]) [id number] Page 2 of 2 -------------------------------------------------------------------------------- Vitals Details Patient Name: Date of Service: [name] 11/01/2022 10:00 A M Medical Record Number: [id number] Patient Account Number: [id number] Date of Birth/Sex: Treating RN: [name], [name] (37 y.o. Cline Cools Primary Care Atwood Adcock: [name] Other Clinician: [name] Referring Bo Teicher: Treating Arlette Schaad/Extender: [name] in Treatment: 157 Vital Signs Time Taken: 10:45 Temperature (F): 97.4 Height (in): 77 Pulse (bpm): 96 Weight (lbs): 280 Respiratory Rate (breaths/min): 18 Body Mass Index (BMI): 33.2 Blood Pressure (mmHg): 118/76 Capillary Blood Glucose (mg/dl): 660 Reference Range: 80 - 120 mg / dl Electronic Signature(s) Signed: 11/01/2022 1:22:07 PM By: [name] EMT Entered By: [name] on 11/01/2022 13:22:07

## 2022-11-01 NOTE — Progress Notes (Signed)
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)    Obesity (BMI 30-39.9) 10/27/2021   Charcot foot due to diabetes mellitus (HCC) 10/27/2021   Hepatic steatosis 09/14/2021   Intractable nausea and vomiting 09/13/2021   Obesity, Class III, BMI 40-49.9 (morbid obesity) (HCC) 09/13/2021   Splenic lesion 09/13/2021   Diabetic ulcer of right midfoot associated with diabetes mellitus due to underlying condition, with muscle involvement without evidence of necrosis (HCC)    Leg swelling    Cellulitis of right lower extremity    Diabetic foot infection (HCC) 09/28/2019   Hypochromic anemia 09/28/2019   Severe nonproliferative diabetic retinopathy of right eye, with macular edema, associated with type 1 diabetes mellitus (HCC) 07/18/2019   Severe nonproliferative diabetic retinopathy of left eye, with macular edema, associated with type 1 diabetes mellitus (HCC) 07/18/2019   Diabetic cataract (HCC) 07/18/2019   Osteomyelitis of ankle or foot, acute, left (HCC) 03/11/2019   Pneumonia due to COVID-19 virus 02/07/2019   Uncontrolled DM-1 with hyperglycemia, retinopathy, cataract and b/l feet ulcer 02/07/2019   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   CONTINUOUS BLOOD GLUC SENSOR (FREESTYLE LIBRE 14 DAY SENSOR) MISC    Inject 1 patch into the skin every 14 (fourteen) days.   CONTINUOUS  BLOOD GLUC SENSOR MISC    1 each by Does not apply route as directed. Use as directed every 14 days. May dispense FreeStyle [employer] or similar.   DOXYCYCLINE (VIBRAMYCIN) 100 MG CAPSULE    Take 100 mg by mouth 2 (two) times daily.   HYDROCODONE-ACETAMINOPHEN (NORCO/VICODIN) 5-325 MG TABLET    Take 1-2 tablets by mouth every 6 (six) hours as needed.   INSULIN GLARGINE (LANTUS SOLOSTAR) 100 UNIT/ML SOLOSTAR PEN    Inject 40 Units into the skin daily before breakfast.   NOVOLOG FLEXPEN 100 UNIT/ML FLEXPEN    Inject 6-8 Units into the skin 3 (three) times daily before meals.  Modified Medications   No medications on file  Discontinued Medications   No medications on file    Subjective:  37 YM With past medical history of osteomyelitis, treated with doxycycline in 2021 followed by Dr. Orvan Falconer.  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  Patient underwent left fifth metatarsal excision with bone biopsy, ulcerative debridement with graft placement on 7/24 with Dr. Loreta Ave, podiatry.  Cultures grew rare Candida albicans.  Pathology of left foot metatarsal showed necrosis and bone with acute osteomyelitis, left foot cuboid biopsy was consistent with osteomyelitis as well.  Referred to ID for further management. Pt presents with boot on b/l feet. Surgical dressing to be removed Friday.  He had tigh foot Osteo last time. Pt states that is healed, he has graft in place.  Pt states that around 2020 he broke part of left  foot with proturding bone leading to infection.   Pt was on doxy  till OR(Hof of ight foot osteo with MRSA).  Augmetin was started following OR.  Review of Systems: Review of Systems  All other systems reviewed and are negative.   Past Medical History:  Diagnosis Date   Diabetes mellitus    Osteomyelitis (HCC)     Social History   Tobacco Use   Smoking status: Never   Smokeless tobacco:  Never  Vaping Use   Vaping status: Never Used  Substance Use Topics   Alcohol use: No   Drug use: No    Family History  Problem Relation Age of Onset   Hypertension Other    Diabetes Other    Healthy Mother    Colon cancer Neg Hx    Stomach cancer Neg Hx    Pancreatic cancer Neg Hx    Esophageal cancer Neg Hx     Allergies  Allergen Reactions   Basaglar Kwikpen [Insulin Glargine] Diarrhea   Metformin And Related Diarrhea and Nausea And Vomiting   Trulicity [Dulaglutide] Diarrhea    Health Maintenance  Topic Date Due   OPHTHALMOLOGY EXAM  Never done   Diabetic kidney evaluation - Urine ACR  Never done   Hepatitis C Screening  Never done   COVID-19 Vaccine (3 - 2023-24 season) 12/03/2021   HEMOGLOBIN A1C  03/15/2022   INFLUENZA VACCINE  11/03/2022   FOOT EXAM  09/06/2023   Diabetic kidney evaluation - eGFR measurement  10/25/2023   DTaP/Tdap/Td (2 - Td or Tdap) 10/29/2024   HIV Screening  Completed   HPV VACCINES  Aged Out    Objective:  There were no vitals filed for this visit. There is no height or weight on file to calculate BMI.  Physical Exam Constitutional:      General: He is not in acute distress.    Appearance: He is normal weight. He is not toxic-appearing.  HENT:     Head: Normocephalic and atraumatic.     Right Ear: External ear normal.     Left Ear: External ear normal.     Nose: No congestion or rhinorrhea.     Mouth/Throat:     Mouth: Mucous membranes are moist.     Pharynx: Oropharynx is clear.  Eyes:     Extraocular Movements: Extraocular movements intact.     Conjunctiva/sclera: Conjunctivae normal.     Pupils: Pupils are equal, round, and reactive to light.  Cardiovascular:     Rate and Rhythm: Normal rate and regular rhythm.     Heart sounds: No murmur heard.    No friction rub. No gallop.  Pulmonary:     Effort: Pulmonary effort is normal.     Breath sounds: Normal breath sounds.  Abdominal:     General: Abdomen is flat. Bowel  sounds are normal.     Palpations: Abdomen is soft.  Musculoskeletal:        General: No swelling. Normal range of motion.     Cervical back: Normal range of motion and neck supple.  Skin:    General: Skin is warm and dry.  Neurological:     General: No focal deficit present.     Mental Status: He is oriented to person, place, and time.  Psychiatric:        Mood and Affect: Mood normal.     Lab Results Lab Results  Component Value Date   WBC 6.7 10/31/2021   HGB 10.4 (L) 10/31/2021  HCT 33.1 (L) 10/31/2021   MCV 80.9 10/31/2021   PLT 256 10/31/2021    Lab Results  Component Value Date   CREATININE 1.19 10/25/2022   BUN 12 10/25/2022   NA 137 10/25/2022   K 4.8 10/25/2022   CL 100 10/25/2022   CO2 25 10/25/2022    Lab Results  Component Value Date   ALT 19 10/26/2021   AST 17 10/26/2021   ALKPHOS 123 10/26/2021   BILITOT <0.1 (L) 10/26/2021    Lab Results  Component Value Date   TRIG 118 02/07/2019   No results found for: "LABRPR", "RPRTITER" No results found for: "HIV1RNAQUANT", "HIV1RNAVL", "CD4TABS"   Problem List Items Addressed This Visit   None  Assessment/Plan #Left foot osteomyelitis/DFU #Hx of left foot osteo - Patient underwent left fifth metatarsal excision with bone biopsy, ulcerative debridement with graft placement on 7/24 with Dr. Loreta Ave, podiatry.  Cultures grew rare Candida albicans.  Pathology of left foot metatarsal showed necrosis and bone with acute osteomyelitis, left foot cuboid biopsy was consistent with osteomyelitis as well.  Pt had been on doxy x 9 days starting 10/14/22 and Augmentin starting 10/26/22,He has a Diabetic foot infeciton, seen by Dr. Orvan Falconer in 2021 with Vone Bx+ MRSA /MSSAand staph lugdenensis tereated with linezolid->doxy , lqn and metro Plan: Started fluocnazole 800 mg PO qd,labs and EKG on 7/23 with wtc 394.Plan on atleast 6 months of therapy. OF note pt weighs 161kg, as such 6mg /lg would be 1gm every day. I think  geiven increase incidenceof SE profile iwht higher dose fluconazole will trial on 800mg  PO every day x 1 month and see him back for repeat labs, EKG. OF note there is wound cx form 7/23 that grew candida albicans only as well, as such I think monotherapy to cover candida is suffocant for this osteo. Pt spoke with pharmacy today in regards to medication Called lab and sent for sens F/U in one month  Danelle Earthly, MD [facility] 11/01/2022, 6:06 AM   I have personally spent 110 minutes involved in face-to-face and non-face-to-face activities for this patient on the day of the visit. Professional time spent includes the following activities: Preparing to see the patient (review of tests), Obtaining and/or reviewing separately obtained history (admission/discharge record), Performing a medically appropriate examination and/or evaluation , Ordering medications/tests/procedures, referring and communicating with other health care professionals, Documenting clinical information in the EMR, Independently interpreting results (not separately reported), Communicating results to the patient/family/caregiver, Counseling and educating the patient/family/caregiver and Care coordination (not separately reported).

## 2022-11-01 NOTE — Progress Notes (Signed)
[place], [name] ([id number]) [id number] Page 1 of 3 Visit Report for 11/01/2022 Problem List Details Patient Name: Date of Service: [name] 11/01/2022 10:00 A M Medical Record Number: [id number] Patient Account Number: [id number] Date of Birth/Sex: Treating RN: Dec 08, 1985 (37 y.o. Cline Cools Primary Care Provider: [name] Other Clinician: [name] Referring Provider: Treating Provider/Extender: [name] in Treatment: 157 Active Problems ICD-10 Encounter Code Description Active Date MDM Diagnosis L97.524 Non-pressure chronic ulcer of other part of left foot with necrosis of 10/24/2019 No Yes bone M86.172 Other acute osteomyelitis, left ankle and foot 09/14/2022 No Yes 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 09/28/2022 No Yes without evidence of necrosis L97.512 Non-pressure chronic ulcer of other part of right foot with fat layer 10/04/2022 No Yes exposed E11.621 Type 2 diabetes mellitus with foot ulcer 10/24/2019 No Yes E11.622 Type 2 diabetes mellitus with other skin ulcer 09/28/2022 No Yes Inactive Problems ICD-10 Code Description Active Date Inactive Date L97.518 Non-pressure chronic ulcer of other part of right foot with other specified 07/14/2020 07/14/2020 severity L97.518 Non-pressure chronic ulcer of other part of right foot with other specified 10/24/2019 10/24/2019 severity 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, right ankle and foot 10/24/2019 10/24/2019 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 08/10/2020 08/10/2020 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, left ankle and foot 10/24/2019 10/24/2019 L97.322 Non-pressure chronic ulcer of left ankle with fat layer exposed 09/29/2021 09/29/2021 [place], [name] ([id number]) [phone].pdf Page 2 of 3 [phone] Non-pressure chronic ulcer of other part of left  foot with fat layer exposed 09/07/2022 09/07/2022 B95.62 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 10/24/2019 10/24/2019 classified elsewhere Resolved Problems ICD-10 Code Description Active Date Resolved Date L89.892 Pressure ulcer of other site, stage 2 07/27/2022 07/27/2022 Electronic Signature(s) Signed: 11/01/2022 1:55:13 PM By: [name] EMT Signed: 11/01/2022 3:21:59 PM By: Duanne Guess MD FACS Entered By: [name] on 11/01/2022 13:55:13 -------------------------------------------------------------------------------- SuperBill Details Patient Name: Date of Service: [name], [name] 11/01/2022 Medical Record Number: [id number] Patient Account Number: [id number] Date of Birth/Sex: Treating RN: 1985/04/25 (37 y.o. Cline Cools Primary Care Provider: [name] Other Clinician: [name] Referring Provider: Treating Provider/Extender: [name] in Treatment: 157 Diagnosis Coding ICD-10 Codes Code Description [phone] 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one M86.172 Other acute osteomyelitis, left ankle and foot 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 without evidence of necrosis L97.512 Non-pressure chronic ulcer of other part of right foot with fat layer exposed E11.621 Type 2 diabetes mellitus with foot ulcer E11.622 Type 2 diabetes mellitus with other skin ulcer Facility Procedures : CPT4 Code Description: [id number] G0277-(Facility Use Only) HBOT full body chamber, , ICD-10 Diagnosis Description 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 M86.172 Other acute osteomyelitis, left ankle and  foot 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 without L97.512 Non-pressure chronic ulcer of other part of right foot with fat layer exp Modifier: ne evidence of necr  osed Quantity: 4 osis Physician Procedures : CPT4 Code Description Modifier [id number] - WC PHYS HYPERBARIC OXYGEN THERAPY ICD-10 Diagnosis Description 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

## 2022-11-01 NOTE — Progress Notes (Addendum)
[place], [name] ([id number]) [id number] Page 1 of 2 Visit Report for 11/01/2022 HBO Details Patient Name: Date of Service: [name] 11/01/2022 10:00 A M Medical Record Number: [id number] Patient Account Number: [id number] Date of Birth/Sex: Treating RN: 02-09-1986 (37 y.o. Cline Cools Primary Care Nakeesha Bowler: [name] Other Clinician: [name] Referring Caprice Wasko: Treating Nautia Lem/Extender: [name] in Treatment: 157 HBO Treatment Course Details Treatment Course Number: 2 Ordering Athziri Freundlich: Duanne Guess T Treatments Ordered: otal 40 HBO Treatment Start Date: 10/04/2022 HBO Indication: Diabetic Ulcer(s) of the Lower Extremity Wound #3 Left, Lateral, Plantar Foot HBO Treatment Details Treatment Number: 5 Patient Type: Outpatient Chamber Type: Monoplace Chamber Serial #: [id number] Treatment Protocol: 2.0 ATA with 90 minutes oxygen, and no air breaks Treatment Details Compression Rate Down: 1.5 psi / minute De-Compression Rate Up: 1.5 psi / minute Air breaks and breathing Decompress Decompress Compress Tx Pressure Begins Reached periods Begins Ends (leave unused spaces blank) Chamber Pressure (ATA 1 2 ------2 1 ) Clock Time (24 hr) 11:20 11:32 - - - - - - 13:03 13:12 Treatment Length: 112 (minutes) Treatment Segments: 4 Vital Signs Capillary Blood Glucose Reference Range: 80 - 120 mg / dl HBO Diabetic Blood Glucose Intervention Range: <131 mg/dl or >324 mg/dl Time Vitals Blood Respiratory Capillary Blood Glucose Pulse Action Type: Pulse: Temperature: Taken: Pressure: Rate: Glucose (mg/dl): Meter #: Oximetry (%) Taken: Pre 10:45 118/76 96 18 97.4 145 Post 13:16 117/61 75 18 98.4 108 Treatment Response Treatment Toleration: Well Treatment Completion Status: Treatment Completed without Adverse Event Additional Procedure Documentation Tissue Sevierity: Necrosis of bone Physician HBO  Attestation: I certify that I supervised this HBO treatment in accordance with Medicare guidelines. A trained emergency response team is readily available per Yes hospital policies and procedures. Continue HBOT as ordered. Yes Electronic Signature(s) Signed: 11/01/2022 3:24:27 PM By: Duanne Guess MD FACS Previous Signature: 11/01/2022 1:25:13 PM Version By: [name] EMT Entered By: Duanne Guess on 11/01/2022 15:24:27 [place], [name] ([id number]) [id number] Page 2 of 2 -------------------------------------------------------------------------------- HBO Safety Checklist Details Patient Name: Date of Service: [name] 11/01/2022 10:00 A M Medical Record Number: [id number] Patient Account Number: [id number] Date of Birth/Sex: Treating RN: Jan 04, 1986 (37 y.o. Cline Cools Primary Care Tessia Kassin: [name] Other Clinician: [name] Referring Suhaib Guzzo: Treating Rumaysa Sabatino/Extender: [name] in Treatment: 157 HBO Safety Checklist Items Safety Checklist Consent Form Signed Patient voided / foley secured and emptied When did you last eato 0700 Last dose of injectable or oral agent Yesterday Novalog Ostomy pouch emptied and vented if applicable NA All implantable devices assessed, documented and approved NA Intravenous access site secured and place NA Valuables secured Linens and cotton and cotton/polyester blend (less than 51% polyester) Personal oil-based products / skin lotions / body lotions removed Wigs or hairpieces removed NA Smoking or tobacco materials removed Books / newspapers / magazines / loose paper removed Cologne, aftershave, perfume and deodorant removed Jewelry removed (may wrap wedding band) Make-up removed NA Hair care products removed Battery operated devices (external) removed Libre3 Heating patches and chemical warmers removed Titanium eyewear removed NA Nail polish cured  greater than 10 hours NA Casting material cured greater than 10 hours NA Hearing aids removed NA Loose dentures or partials removed NA Prosthetics have been removed NA Patient demonstrates correct use of air break device (if applicable) Patient concerns have been addressed Patient grounding bracelet on and cord attached to chamber Specifics for Inpatients (complete in addition to  above) Medication sheet sent with patient NA Intravenous medications needed or due during therapy sent with patient NA Drainage tubes (e.g. nasogastric tube or chest tube secured and vented) NA Endotracheal or Tracheotomy tube secured NA Cuff deflated of air and inflated with saline NA Airway suctioned NA Notes The safety checklist was done before the treatment was started. Electronic Signature(s) Signed: 11/01/2022 1:23:49 PM By: [name] EMT Entered By: [name] on 11/01/2022 13:23:49

## 2022-11-02 ENCOUNTER — Encounter (HOSPITAL_BASED_OUTPATIENT_CLINIC_OR_DEPARTMENT_OTHER): Payer: BC Managed Care – PPO | Admitting: General Surgery

## 2022-11-02 ENCOUNTER — Ambulatory Visit (HOSPITAL_BASED_OUTPATIENT_CLINIC_OR_DEPARTMENT_OTHER): Payer: BC Managed Care – PPO | Admitting: General Surgery

## 2022-11-02 DIAGNOSIS — E11621 Type 2 diabetes mellitus with foot ulcer: Secondary | ICD-10-CM | POA: Diagnosis not present

## 2022-11-02 LAB — GLUCOSE, CAPILLARY
Glucose-Capillary: 175 mg/dL — ABNORMAL HIGH (ref 70–99)
Glucose-Capillary: 170 mg/dL — ABNORMAL HIGH (ref 70–99)

## 2022-11-02 NOTE — Progress Notes (Signed)
[place], [name] ([id number]) [id number] Page 1 of 3 Visit Report for 11/02/2022 Problem List Details Patient Name: Date of Service: [name] 11/02/2022 10:00 A M Medical Record Number: [id number] Patient Account Number: [id number] Date of Birth/Sex: Treating RN: 11-Oct-1985 (37 y.o. [name], [name] Primary Care Provider: [name] Other Clinician: [name] Referring Provider: Treating Provider/Extender: [name] in Treatment: [phone] Active Problems ICD-10 Encounter Code Description Active Date MDM Diagnosis L97.524 Non-pressure chronic ulcer of other part of left foot with necrosis of 10/24/2019 No Yes bone M86.172 Other acute osteomyelitis, left ankle and foot 09/14/2022 No Yes 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 09/28/2022 No Yes without evidence of necrosis L97.512 Non-pressure chronic ulcer of other part of right foot with fat layer 10/04/2022 No Yes exposed E11.621 Type 2 diabetes mellitus with foot ulcer 10/24/2019 No Yes E11.622 Type 2 diabetes mellitus with other skin ulcer 09/28/2022 No Yes Inactive Problems ICD-10 Code Description Active Date Inactive Date L97.518 Non-pressure chronic ulcer of other part of right foot with other specified 07/14/2020 07/14/2020 severity L97.518 Non-pressure chronic ulcer of other part of right foot with other specified 10/24/2019 10/24/2019 severity 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, right ankle and foot 10/24/2019 10/24/2019 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 08/10/2020 08/10/2020 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, left ankle and foot 10/24/2019 10/24/2019 L97.322 Non-pressure chronic ulcer of left ankle with fat layer exposed 09/29/2021 09/29/2021 [place], [name] ([id number]) [id number] Page 2 of 3 [phone] Non-pressure chronic ulcer of other part of left  foot with fat layer exposed 09/07/2022 09/07/2022 B95.62 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 10/24/2019 10/24/2019 classified elsewhere Resolved Problems ICD-10 Code Description Active Date Resolved Date L89.892 Pressure ulcer of other site, stage 2 07/27/2022 07/27/2022 Electronic Signature(s) Signed: 11/02/2022 12:47:11 PM By: [name] EMT Signed: 11/02/2022 4:04:13 PM By: Duanne Guess MD FACS Entered By: [name] on 11/02/2022 12:47:11 -------------------------------------------------------------------------------- SuperBill Details Patient Name: Date of Service: [name], [name] 11/02/2022 Medical Record Number: [id number] Patient Account Number: [id number] Date of Birth/Sex: Treating RN: 1985-04-17 (37 y.o. [name], [name] Primary Care Provider: [name] Other Clinician: [name] Referring Provider: Treating Provider/Extender: [name] in Treatment: 157 Diagnosis Coding ICD-10 Codes Code Description [phone] 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one M86.172 Other acute osteomyelitis, left ankle and foot 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 without evidence of necrosis L97.512 Non-pressure chronic ulcer of other part of right foot with fat layer exposed E11.621 Type 2 diabetes mellitus with foot ulcer E11.622 Type 2 diabetes mellitus with other skin ulcer Facility Procedures : CPT4 Code: [id number] Description: G0277-(Facility Use Only) HBOT full body chamber, , ICD-10 Diagnosis Description 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 M86.172 Other acute  osteomyelitis, left ankle and foot Modifier: one Quantity: 4 Physician Procedures : CPT4 Code Description Modifier [id number] - WC PHYS HYPERBARIC OXYGEN THERAPY ICD-10  Diagnosis Description 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 M86.172  Other acute osteomyelitis, left ankle and foot Quantity: 1 Electronic Signature(s) Signed: 11/02/2022 12:46:52 PM By: [name] EMT Signed: 11/02/2022 4:04:13 PM By: Duanne Guess MD FACS [place], [name] [phone] By: Duanne Guess MD FACS [id number] Page 3  of 3 Signed: 11/02/2022 4:04:13 Entered By: [name] on 11/02/2022 12:46:47

## 2022-11-02 NOTE — Progress Notes (Signed)
[name], [name] ([id number]) [id number] Page 1 of 2 Visit Report for 11/02/2022 Arrival Information Details Patient Name: Date of Service: [name] 11/02/2022 10:00 A M Medical Record Number: [id number] Patient Account Number: [id number] Date of Birth/Sex: Treating RN: 11-05-1985 (37 y.o. [name], [name] Primary Care Safari Cinque: [name] Other Clinician: [name] Referring Colleene Swarthout: Treating Jaysion Ramseyer/Extender: [name] in Treatment: 157 Visit Information History Since Last Visit All ordered tests and consults were completed: Yes Patient Arrived: [name] Added or deleted any medications: No Arrival Time: 09:51 Any new allergies or adverse reactions: No Accompanied By: None Had a fall or experienced change in No Transfer Assistance: None activities of daily living that may affect Patient Identification Verified: Yes risk of falls: Secondary Verification Process Completed: Yes Signs or symptoms of abuse/neglect since last visito No Patient Requires Transmission-Based Precautions: No Hospitalized since last visit: No Patient Has Alerts: No Implantable device outside of the clinic excluding No cellular tissue based products placed in the center since last visit: Pain Present Now: No Electronic Signature(s) Signed: 11/02/2022 12:03:43 PM By: [name] EMT Entered By: [name] on 11/02/2022 12:03:43 -------------------------------------------------------------------------------- Encounter Discharge Information Details Patient Name: Date of Service: [name], [name] 11/02/2022 10:00 A M Medical Record Number: [id number] Patient Account Number: [id number] Date of Birth/Sex: Treating RN: 1986/03/23 (37 y.o. [name] Primary Care Aubrey Voong: [name] Other Clinician: [name] Referring Lorean Ekstrand: Treating [name]/Extender: [name] in Treatment:  [phone] Encounter Discharge Information Items Discharge Condition: Stable Ambulatory Status: Walker Discharge Destination: Home Transportation: Private Auto Accompanied By: None Schedule Follow-up Appointment: Yes Clinical Summary of Care: Electronic Signature(s) Signed: 11/02/2022 12:47:49 PM By: [name] EMT Entered By: [name] on 11/02/2022 12:47:49 Kristiansen, [name] ([id number]) [id number] Page 2 of 2 -------------------------------------------------------------------------------- Vitals Details Patient Name: Date of Service: [name] 11/02/2022 10:00 A M Medical Record Number: [id number] Patient Account Number: [id number] Date of Birth/Sex: Treating RN: 03-16-1986 (37 y.o. [name], [name] Primary Care Jazari Ober: [name] Other Clinician: [name] Referring Nanna Ertle: Treating [name]/Extender: [name] in Treatment: 157 Vital Signs Time Taken: 09:56 Temperature (F): 97.2 Height (in): 77 Pulse (bpm): 80 Weight (lbs): 280 Respiratory Rate (breaths/min): 18 Body Mass Index (BMI): 33.2 Blood Pressure (mmHg): 128/79 Capillary Blood Glucose (mg/dl): 762 Reference Range: 80 - 120 mg / dl Electronic Signature(s) Signed: 11/02/2022 12:04:40 PM By: [name] EMT Entered By: [name] on 11/02/2022 12:04:40

## 2022-11-02 NOTE — Progress Notes (Addendum)
[place], [name] ([id number]) [id number] Page 1 of 2 Visit Report for 11/02/2022 HBO Details Patient Name: Date of Service: [name] D 11/02/2022 10:00 A M Medical Record Number: [id number] Patient Account Number: [id number] Date of Birth/Sex: Treating RN: [birth year] (37 y.o. [name], [name] Primary Care [name]: [name] Other Clinician: Karl [name] Referring [name]: Treating [name]/Extender: [name] in Treatment: 157 HBO Treatment Course Details Treatment Course Number: 2 Ordering [name]: [name] T Treatments Ordered: otal 40 HBO Treatment Start Date: 10/04/2022 HBO Indication: Diabetic Ulcer(s) of the Lower Extremity Wound #3 Left, Lateral, Plantar Foot HBO Treatment Details Treatment Number: 6 Patient Type: Outpatient Chamber Type: Monoplace Chamber Serial #: [id number] Treatment Protocol: 2.0 ATA with 90 minutes oxygen, and no air breaks Treatment Details Compression Rate Down: 1.5 psi / minute De-Compression Rate Up: Air breaks and breathing Decompress Decompress Compress Tx Pressure Begins Reached periods Begins Ends (leave unused spaces blank) Chamber Pressure (ATA 1 2 ------2 1 ) Clock Time (24 hr) 10:28 10:40 - - - - - - 12:10 12:22 Treatment Length: 114 (minutes) Treatment Segments: 4 Vital Signs Capillary Blood Glucose Reference Range: 80 - 120 mg / dl HBO Diabetic Blood Glucose Intervention Range: <131 mg/dl or >324 mg/dl Time Vitals Blood Respiratory Capillary Blood Glucose Pulse Action Type: Pulse: Temperature: Taken: Pressure: Rate: Glucose (mg/dl): Meter #: Oximetry (%) Taken: Pre 09:56 128/79 80 18 97.2 175 Post 12:27 149/83 80 18 98.1 170 Treatment Response Treatment Toleration: Well Treatment Completion Status: Treatment Completed without Adverse Event Additional Procedure Documentation Tissue Sevierity: Necrosis of bone Physician HBO Attestation: I certify  that I supervised this HBO treatment in accordance with Medicare guidelines. A trained emergency response team is readily available per Yes hospital policies and procedures. Continue HBOT as ordered. Yes Electronic Signature(s) Signed: 11/02/2022 4:04:54 PM By: Max Guess MD FACS Previous Signature: 11/02/2022 12:46:09 PM Version By: [name] EMT Previous Signature: 11/02/2022 12:07:20 PM Version By: [name] EMT Entered By: [name] on 11/02/2022 16:04:53 [place], [name] ([id number]) [id number] Page 2 of 2 -------------------------------------------------------------------------------- HBO Safety Checklist Details Patient Name: Date of Service: [name] D 11/02/2022 10:00 A M Medical Record Number: [id number] Patient Account Number: [id number] Date of Birth/Sex: Treating RN: 10-30-1985 (37 y.o. [name], [name] Primary Care [name]: [name] Other Clinician: Karl [name] Referring [name]: Treating [name]/Extender: [name] in Treatment: 157 HBO Safety Checklist Items Safety Checklist Consent Form Signed Patient voided / foley secured and emptied When did you last eato 0930 Last dose of injectable or oral agent yesterday 40 units lantus Ostomy pouch emptied and vented if applicable NA All implantable devices assessed, documented and approved NA Intravenous access site secured and place NA Valuables secured Linens and cotton and cotton/polyester blend (less than 51% polyester) Personal oil-based products / skin lotions / body lotions removed Wigs or hairpieces removed NA Smoking or tobacco materials removed Books / newspapers / magazines / loose paper removed Cologne, aftershave, perfume and deodorant removed Jewelry removed (may wrap wedding band) Make-up removed NA Hair care products removed Battery operated devices (external) removed Heating patches and chemical warmers  removed Titanium eyewear removed NA Nail polish cured greater than 10 hours NA Casting material cured greater than 10 hours NA Hearing aids removed NA Loose dentures or partials removed NA Prosthetics have been removed NA Patient demonstrates correct use of air break device (if applicable) Patient concerns have been addressed Patient grounding bracelet on and cord attached to chamber  Specifics for Inpatients (complete in addition to above) Medication sheet sent with patient NA Intravenous medications needed or due during therapy sent with patient NA Drainage tubes (e.g. nasogastric tube or chest tube secured and vented) NA Endotracheal or Tracheotomy tube secured NA Cuff deflated of air and inflated with saline NA Airway suctioned NA Notes The safety checklist was done before the treatment was started. Electronic Signature(s) Signed: 11/02/2022 12:06:43 PM By: [name] EMT Entered By: [name] on 11/02/2022 12:06:43

## 2022-11-02 NOTE — Progress Notes (Signed)
[place], [name] ([id number]) [id number] Page 1 of 5 Visit Report for 11/01/2022 Arrival Information Details Patient Name: Date of Service: [name] 11/01/2022 1:45 PM Medical Record Number: [id number] Patient Account Number: [id number] Date of Birth/Sex: Treating RN: April 10, 1985 (37 y.o. [name] Primary Care Analina Filla: [name] Other Clinician: Referring Osie Amparo: Treating Iylah Dworkin/Extender: [name] in Treatment: 157 Visit Information History Since Last Visit Has Dressing in Place as Prescribed: Yes Patient Arrived: Ambulatory Pain Present Now: No Arrival Time: 16:44 Accompanied By: self Transfer Assistance: None Patient Identification Verified: Yes Secondary Verification Process Completed: Yes Patient Requires Transmission-Based Precautions: No Patient Has Alerts: No Electronic Signature(s) Signed: 11/01/2022 5:32:33 PM By: Zenaida Deed RN, BSN Entered By: Zenaida Deed on 11/01/2022 16:44:42 -------------------------------------------------------------------------------- Clinic Level of Care Assessment Details Patient Name: Date of Service: [name] D 11/01/2022 1:45 PM Medical Record Number: [id number] Patient Account Number: [id number] Date of Birth/Sex: Treating RN: February 04, 1986 (37 y.o. [name] Primary Care Dallen Bunte: [name] Other Clinician: Referring Scharlene Catalina: Treating Jeffrey [name]/Extender: [name] in Treatment: 157 Clinic Level of Care Assessment Items TOOL 4 Quantity Score []  - 0 Use when only an EandM is performed on FOLLOW-UP visit ASSESSMENTS - Nursing Assessment / Reassessment X- 1 10 Reassessment of Co-morbidities (includes updates in patient status) X- 1 5 Reassessment of Adherence to Treatment Plan ASSESSMENTS - Wound and Skin A ssessment / Reassessment X - Simple Wound Assessment / Reassessment - one wound 1 5 []  -  0 Complex Wound Assessment / Reassessment - multiple wounds []  - 0 Dermatologic / Skin Assessment (not related to wound area) ASSESSMENTS - Focused Assessment []  - 0 Circumferential Edema Measurements - multi extremities []  - 0 Nutritional Assessment / Counseling / Intervention []  - 0 Lower Extremity Assessment (monofilament, tuning fork, pulses) []  - 0 Peripheral Arterial Disease Assessment (using hand held doppler) ASSESSMENTS - Ostomy and/or Continence Assessment and Care [place], [name] ([id number]) [id number] Page 2 of 5 []  - 0 Incontinence Assessment and Management []  - 0 Ostomy Care Assessment and Management (repouching, etc.) PROCESS - Coordination of Care X - Simple Patient / Family Education for ongoing care 1 15 []  - 0 Complex (extensive) Patient / Family Education for ongoing care X- 1 10 Staff obtains Consents, Records, T Results / Process Orders est []  - 0 Staff telephones HHA, Nursing Homes / Clarify orders / etc []  - 0 Routine Transfer to another Facility (non-emergent condition) []  - 0 Routine Hospital Admission (non-emergent condition) []  - 0 New Admissions / Manufacturing engineer / Ordering NPWT Apligraf, etc. , []  - 0 Emergency Hospital Admission (emergent condition) X- 1 10 Simple Discharge Coordination []  - 0 Complex (extensive) Discharge Coordination PROCESS - Special Needs []  - 0 Pediatric / Minor Patient Management []  - 0 Isolation Patient Management []  - 0 Hearing / Language / Visual special needs []  - 0 Assessment of Community assistance (transportation, D/C planning, etc.) []  - 0 Additional assistance / Altered mentation []  - 0 Support Surface(s) Assessment (bed, cushion, seat, etc.) INTERVENTIONS - Wound Cleansing / Measurement []  - 0 Simple Wound Cleansing - one wound []  - 0 Complex Wound Cleansing - multiple wounds []  - 0 Wound Imaging (photographs - any number of wounds) []  - 0 Wound Tracing  (instead of photographs) []  - 0 Simple Wound Measurement - one wound []  - 0 Complex Wound Measurement - multiple wounds INTERVENTIONS - Wound Dressings X - Small Wound Dressing one or multiple wounds 1  10 []  - 0 Medium Wound Dressing one or multiple wounds []  - 0 Large Wound Dressing one or multiple wounds X- 1 5 Application of Medications - topical []  - 0 Application of Medications - injection INTERVENTIONS - Miscellaneous []  - 0 External ear exam []  - 0 Specimen Collection (cultures, biopsies, blood, body fluids, etc.) []  - 0 Specimen(s) / Culture(s) sent or taken to Lab for analysis []  - 0 Patient Transfer (multiple staff / Nurse, adult / Similar devices) []  - 0 Simple Staple / Suture removal (25 or less) []  - 0 Complex Staple / Suture removal (26 or more) []  - 0 Hypo / Hyperglycemic Management (close monitor of Blood Glucose) []  - 0 Ankle / Brachial Index (ABI) - do not check if billed separately []  - 0 Vital Signs Has the patient been seen at the hospital within the last three years: Yes Total Score: 70 Level Of Care: New/Established - Level 2 [place], [name] ([id number]) [id number] Page 3 of 5 Electronic Signature(s) Signed: 11/01/2022 5:32:33 PM By: Zenaida Deed RN, BSN Entered By: Zenaida Deed on 11/01/2022 16:48:17 -------------------------------------------------------------------------------- Encounter Discharge Information Details Patient Name: Date of Service: [name] D 11/01/2022 1:45 PM Medical Record Number: [id number] Patient Account Number: [id number] Date of Birth/Sex: Treating RN: Sep 18, 1985 (37 y.o. [name] Primary Care Aquita Simmering: [name] Other Clinician: Referring Betania Dizon: Treating Marguerette Sheller/Extender: [name] in Treatment: 157 Encounter Discharge Information Items Discharge Condition: Stable Ambulatory Status: Ambulatory Discharge Destination:  Home Transportation: Private Auto Accompanied By: self Schedule Follow-up Appointment: Yes Clinical Summary of Care: Patient Declined Electronic Signature(s) Signed: 11/01/2022 5:32:33 PM By: Zenaida Deed RN, BSN Entered By: Zenaida Deed on 11/01/2022 16:47:42 -------------------------------------------------------------------------------- Patient/Caregiver Education Details Patient Name: Date of Service: [name] D 7/30/2024andnbsp1:45 PM Medical Record Number: [id number] Patient Account Number: [id number] Date of Birth/Gender: Treating RN: 04-17-1985 (37 y.o. [name] Primary Care Physician: [name] Other Clinician: Referring Physician: Treating Physician/Extender: [name] in Treatment: 157 Education Assessment Education Provided To: Patient Education Topics Provided Offloading: Methods: Explain/Verbal Responses: Reinforcements needed, State content correctly Wound/Skin Impairment: Methods: Explain/Verbal Responses: Reinforcements needed, State content correctly Electronic Signature(s) Signed: 11/01/2022 5:32:33 PM By: Zenaida Deed RN, BSN Entered By: Zenaida Deed on 11/01/2022 16:47:21 [place], [name] ([id number]) [id number] Page 4 of 5 -------------------------------------------------------------------------------- Wound Assessment Details Patient Name: Date of Service: [name] 11/01/2022 1:45 PM Medical Record Number: [id number] Patient Account Number: [id number] Date of Birth/Sex: Treating RN: 1985/04/22 (37 y.o. [name] Primary Care Kouper Spinella: [name] Other Clinician: Referring Advaith Lamarque: Treating Jaydeen Darley/Extender: [name] in Treatment: 157 Wound Status Wound Number: 3 Primary Etiology: Diabetic Wound/Ulcer of the Lower Extremity Wound Location: Left, Lateral, Plantar Foot Wound Status: Open Wounding Event:  Trauma Date Acquired: 10/02/2019 Weeks Of Treatment: 157 Clustered Wound: No Wound Measurements Length: (cm) 3.5 Width: (cm) 3.5 Depth: (cm) 2.2 Area: (cm) 9.621 Volume: (cm) 21.166 % Reduction in Area: -483.4% % Reduction in Volume: -12727.9% Wound Description Classification: Grade 3 Exudate Amount: Large Exudate Type: Serosanguineous Exudate Color: red, brown Periwound Skin Texture Texture Color No Abnormalities Noted: No No Abnormalities Noted: No Moisture No Abnormalities Noted: No Treatment Notes Wound #3 (Foot) Wound Laterality: Plantar, Left, Lateral Cleanser Soap and Water Discharge Instruction: May shower and wash wound with dial antibacterial soap and water prior to dressing change. Vashe 5.8 (oz) Discharge Instruction: Cleanse the wound with Vashe prior to applying a clean dressing, let sit on  wound for 5-10 minutesusing gauze sponges, not tissue or cotton balls. Peri-Wound Care Skin Prep Discharge Instruction: Use skin prep as directed Topical Skintegrity Hydrogel 4 (oz) Discharge Instruction: Apply hydrogel as directed Primary Dressing Secondary Dressing Woven Gauze Sponge, Non-Sterile 4x4 in Discharge Instruction: Apply over primary dressing as directed. Zetuvit Plus 4x8 in Discharge Instruction: Apply over primary dressing as directed. Secured With Elastic Bandage 4 inch (ACE bandage) [place], [name] ([id number]) [id number] Page 5 of 5 Discharge Instruction: Secure with ACE bandage as directed. Kerlix Roll Sterile, 4.5x3.1 (in/yd) Discharge Instruction: Secure with Kerlix as directed. 68M Medipore H Soft Cloth Surgical T ape, 4 x 10 (in/yd) Discharge Instruction: Secure with tape as directed. Compression Wrap Compression Stockings Add-Ons Notes incision and skin graft intact Electronic Signature(s) Signed: 11/01/2022 5:32:33 PM By: Zenaida Deed RN, BSN Entered By: Zenaida Deed on 11/01/2022 16:45:17

## 2022-11-02 NOTE — Progress Notes (Signed)
[place], [name] ([id number]) [id number] Page 1 of 1 Visit Report for 11/01/2022 SuperBill Details Patient Name: Date of Service: Geoffry [place] 11/01/2022 Medical Record Number: [id number] Patient Account Number: [id number] Date of Birth/Sex: Treating RN: 05-Jul-1985 (37 y.o. 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

## 2022-11-03 ENCOUNTER — Encounter (HOSPITAL_BASED_OUTPATIENT_CLINIC_OR_DEPARTMENT_OTHER): Payer: BC Managed Care – PPO | Attending: General Surgery | Admitting: General Surgery

## 2022-11-03 DIAGNOSIS — E11621 Type 2 diabetes mellitus with foot ulcer: Secondary | ICD-10-CM | POA: Diagnosis not present

## 2022-11-03 DIAGNOSIS — E11622 Type 2 diabetes mellitus with other skin ulcer: Secondary | ICD-10-CM | POA: Diagnosis not present

## 2022-11-03 DIAGNOSIS — L97524 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one: Secondary | ICD-10-CM | POA: Insufficient documentation

## 2022-11-03 DIAGNOSIS — L97315 Non-pressure chronic ulcer of right ankle with muscle involvement without evidence of necrosis: Secondary | ICD-10-CM | POA: Insufficient documentation

## 2022-11-03 DIAGNOSIS — M86172 Other acute osteomyelitis, left ankle and foot: Secondary | ICD-10-CM | POA: Diagnosis not present

## 2022-11-03 DIAGNOSIS — L97512 Non-pressure chronic ulcer of other part of right foot with fat layer exposed: Secondary | ICD-10-CM | POA: Diagnosis not present

## 2022-11-03 LAB — GLUCOSE, CAPILLARY
Glucose-Capillary: 103 mg/dL — ABNORMAL HIGH (ref 70–99)
Glucose-Capillary: 159 mg/dL — ABNORMAL HIGH (ref 70–99)
Glucose-Capillary: 147 mg/dL — ABNORMAL HIGH (ref 70–99)

## 2022-11-03 NOTE — Progress Notes (Signed)
[place], [name] ([id number]) [id number] Page 1 of 3 Visit Report for 11/03/2022 Problem List Details Patient Name: Date of Service: [name] D 11/03/2022 10:00 A M Medical Record Number: [id number] Patient Account Number: [id number] Date of Birth/Sex: Treating RN: 06-08-1985 (37 y.o. 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 10/24/2019 No Yes bone M86.172 Other acute osteomyelitis, left ankle and foot 09/14/2022 No Yes 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 09/28/2022 No Yes without evidence of necrosis L97.512 Non-pressure chronic ulcer of other part of right foot with fat layer 10/04/2022 No Yes exposed E11.621 Type 2 diabetes mellitus with foot ulcer 10/24/2019 No Yes E11.622 Type 2 diabetes mellitus with other skin ulcer 09/28/2022 No Yes Inactive Problems ICD-10 Code Description Active Date Inactive Date L97.518 Non-pressure chronic ulcer of other part of right foot with other specified 07/14/2020 07/14/2020 severity L97.518 Non-pressure chronic ulcer of other part of right foot with other specified 10/24/2019 10/24/2019 severity 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, right ankle and foot 10/24/2019 10/24/2019 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 08/10/2020 08/10/2020 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, left ankle and foot 10/24/2019 10/24/2019 L97.322 Non-pressure chronic ulcer of left ankle with fat layer exposed 09/29/2021 09/29/2021 [place], [name] ([id number]) [id number] Page 2 of 3 [phone] Non-pressure chronic ulcer of other part of left  foot with fat layer exposed 09/07/2022 09/07/2022 B95.62 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 10/24/2019 10/24/2019 classified elsewhere Resolved Problems ICD-10 Code Description Active Date Resolved Date L89.892 Pressure ulcer of other site, stage 2 07/27/2022 07/27/2022 Electronic Signature(s) Signed: 11/03/2022 1:59:59 PM By: [name] EMT Signed: 11/03/2022 2:16:35 PM By: Duanne Guess MD FACS Entered By: [name] on 11/03/2022 13:59:59 -------------------------------------------------------------------------------- SuperBill Details Patient Name: Date of Service: [name], [name] 11/03/2022 Medical Record Number: [id number] Patient Account Number: [id number] Date of Birth/Sex: Treating RN: Dec 04, 1985 (37 y.o. 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 without evidence of necrosis L97.512 Non-pressure chronic ulcer of other part of right foot with fat layer exposed E11.621 Type 2 diabetes mellitus with foot ulcer E11.622 Type 2 diabetes mellitus with other skin ulcer Facility Procedures : CPT4 Code: [id number] Description: G0277-(Facility Use Only) HBOT full body chamber, , ICD-10 Diagnosis Description 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 M86.172 Other acute  osteomyelitis, left ankle and foot E11.622 Type 2 diabetes mellitus with other skin ulcer Modifier: one Quantity: 4 Physician Procedures : CPT4 Code Description Modifier [id number]  [id number] - WC PHYS HYPERBARIC OXYGEN THERAPY ICD-10 Diagnosis Description 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

## 2022-11-03 NOTE — Progress Notes (Signed)
[place], [name] ([id number]) [id number] Page 1 of 2 Visit Report for 11/03/2022 HBO Details Patient Name: Date of Service: [name] D 11/03/2022 10:00 A M Medical Record Number: [id number] Patient Account Number: [id number] Date of Birth/Sex: Treating RN: 1986/03/17 (37 y.o. [name] Primary Care Pat Elicker: [name] Other Clinician: [name] Referring Chevy Sweigert: Treating Kathreen Dileo/Extender: [name] in Treatment: 158 HBO Treatment Course Details Treatment Course Number: 2 Ordering Alaysia Lightle: Duanne Guess T Treatments Ordered: otal 40 HBO Treatment Start Date: 10/04/2022 HBO Indication: Diabetic Ulcer(s) of the Lower Extremity Wound #3 Left, Lateral, Plantar Foot HBO Treatment Details Treatment Number: 7 Patient Type: Outpatient Chamber Type: Monoplace Chamber Serial #: [id number] Treatment Protocol: 2.0 ATA with 90 minutes oxygen, and no air breaks Treatment Details Compression Rate Down: 1.5 psi / minute De-Compression Rate Up: 1.5 psi / minute Air breaks and breathing Decompress Decompress Compress Tx Pressure Begins Reached periods Begins Ends (leave unused spaces blank) Chamber Pressure (ATA 1 2 ------2 1 ) Clock Time (24 hr) 10:36 10:47 - - - - - - 12:17 12:25 Treatment Length: 109 (minutes) Treatment Segments: 4 Vital Signs Capillary Blood Glucose Reference Range: 80 - 120 mg / dl HBO Diabetic Blood Glucose Intervention Range: <131 mg/dl or >166 mg/dl Type: Time Vitals Blood Pulse: Respiratory Temperature: Capillary Blood Glucose Pulse Action Taken: Pressure: Rate: Glucose (mg/dl): Meter #: Oximetry (%) Taken: Pre 09:20 103 Patient given 8 oz Glucerna and 8 oz orange juice. Post 12:33 137/74 79 18 98.1 147 Pre 10:08 159 Pre 10:20 135/78 88 18 97.1 Treatment Response Treatment Toleration: Well Treatment Completion Status: Treatment Completed without Adverse Event Treatment Notes The  pretreatment blood sugar was 103. The patient was given 8 oz of Glucerna with 8 oz of orange juice per Dr. Lady Gary. The patient blood sugar was rechecked at 1008 and results were 159. The treatment was started. Additional Procedure Documentation Tissue Sevierity: Necrosis of bone Physician HBO Attestation: I certify that I supervised this HBO treatment in accordance with Medicare guidelines. A trained emergency response team is readily available per Yes hospital policies and procedures. Continue HBOT as ordered. Aleck [place], [name] ([id number]) [id number] Page 2 of 2 Electronic Signature(s) Signed: 11/03/2022 2:18:13 PM By: Duanne Guess MD FACS Previous Signature: 11/03/2022 1:59:13 PM Version By: [name] EMT Entered By: Duanne Guess on 11/03/2022 14:18:13 -------------------------------------------------------------------------------- HBO Safety Checklist Details Patient Name: Date of Service: [name], [name] 11/03/2022 10:00 A M Medical Record Number: [id number] Patient Account Number: [id number] Date of Birth/Sex: Treating RN: 1985/11/18 (37 y.o. [name] Primary Care Kally Cadden: [name] Other Clinician: [name] Referring Romin Divita: Treating Likisha Alles/Extender: [name] in Treatment: 158 HBO Safety Checklist Items Safety Checklist Consent Form Signed Patient voided / foley secured and emptied When did you last eato 0830 Last dose of injectable or oral agent Yesterday 2000 15 units Lantus Ostomy pouch emptied and vented if applicable NA All implantable devices assessed, documented and approved NA Intravenous access site secured and place NA Valuables secured Linens and cotton and cotton/polyester blend (less than 51% polyester) Personal oil-based products / skin lotions / body lotions removed Wigs or hairpieces removed NA Smoking or tobacco materials removed Books / newspapers / magazines /  loose paper removed Cologne, aftershave, perfume and deodorant removed Jewelry removed (may wrap wedding band) Make-up removed NA Hair care products removed Battery operated devices (external) removed Libre Heating patches and chemical warmers removed Titanium eyewear removed NA Nail polish cured  greater than 10 hours NA Casting material cured greater than 10 hours NA Hearing aids removed NA Loose dentures or partials removed NA Prosthetics have been removed NA Patient demonstrates correct use of air break device (if applicable) Patient concerns have been addressed Patient grounding bracelet on and cord attached to chamber Specifics for Inpatients (complete in addition to above) Medication sheet sent with patient NA Intravenous medications needed or due during therapy sent with patient NA Drainage tubes (e.g. nasogastric tube or chest tube secured and vented) NA Endotracheal or Tracheotomy tube secured NA Cuff deflated of air and inflated with saline NA Airway suctioned NA Notes The safety checklist was done before the treatment was started. Electronic Signature(s) Signed: 11/03/2022 1:53:29 PM By: [name] EMT Entered By: [name] on 11/03/2022 13:53:29

## 2022-11-03 NOTE — Progress Notes (Signed)
[place], [name] ([id number]) [id number] Page 1 of 2 Visit Report for 11/03/2022 Arrival Information Details Patient Name: Date of Service: [name] 11/03/2022 10:00 A M Medical Record Number: [id number] Patient Account Number: [id number] Date of Birth/Sex: Treating RN: [birth year] (37 y.o. [name], [name] Primary Care Jedrek Dinovo: [name] Other Clinician: [name] Referring Angelia Hazell: Treating [name]: [name] in Treatment: 158 Visit Information History Since Last Visit All ordered tests and consults were completed: Yes Patient Arrived: [name] Added or deleted any medications: No Arrival Time: 09:12 Any new allergies or adverse reactions: No Accompanied By: None Had a fall or experienced change in No Transfer Assistance: None activities of daily living that may affect Patient Identification Verified: Yes risk of falls: Secondary Verification Process Completed: Yes Signs or symptoms of abuse/neglect since last visito No Patient Requires Transmission-Based Precautions: No Hospitalized since last visit: No Patient Has Alerts: No Implantable device outside of the clinic excluding No cellular tissue based products placed in the center since last visit: Pain Present Now: No Electronic Signature(s) Signed: 11/03/2022 1:51:27 PM By: [name] EMT Entered By: [name] on 11/03/2022 13:51:27 -------------------------------------------------------------------------------- Encounter Discharge Information Details Patient Name: Date of Service: [name], [name] 11/03/2022 10:00 A M Medical Record Number: [id number] Patient Account Number: [id number] Date of Birth/Sex: Treating RN: [name] (37 y.o. [name] Primary Care Edda Orea: [name] Other Clinician: [name] Referring Dallyn Bergland: Treating Navil Kole/Extender: [name] in Treatment:  158 Encounter Discharge Information Items Discharge Condition: Stable Ambulatory Status: Walker Discharge Destination: Home Transportation: Private Auto Accompanied By: None Schedule Follow-up Appointment: Yes Clinical Summary of Care: Electronic Signature(s) Signed: 11/03/2022 2:01:01 PM By: [name] EMT Entered By: [name] on 11/03/2022 14:01:00 [place], [name] ([id number]) [id number] Page 2 of 2 -------------------------------------------------------------------------------- Vitals Details Patient Name: Date of Service: [name] 11/03/2022 10:00 A M Medical Record Number: [id number] Patient Account Number: [id number] Date of Birth/Sex: Treating RN: [birth year] (37 y.o. [name] Primary Care Colbie Sliker: [name] Other Clinician: [name] Referring Fernando Stoiber: Treating Meklit Cotta/Extender: [name] in Treatment: 158 Vital Signs Time Taken: 09:20 Capillary Blood Glucose (mg/dl): 419 Height (in): 77 Reference Range: 80 - 120 mg / dl Weight (lbs): 622 Body Mass Index (BMI): 33.2 Electronic Signature(s) Signed: 11/03/2022 1:51:52 PM By: [name] EMT Entered By: [name] on 11/03/2022 13:51:52

## 2022-11-04 ENCOUNTER — Encounter: Payer: BC Managed Care – PPO | Admitting: Podiatry

## 2022-11-04 ENCOUNTER — Ambulatory Visit (INDEPENDENT_AMBULATORY_CARE_PROVIDER_SITE_OTHER): Payer: BC Managed Care – PPO

## 2022-11-04 ENCOUNTER — Encounter (HOSPITAL_BASED_OUTPATIENT_CLINIC_OR_DEPARTMENT_OTHER): Payer: BC Managed Care – PPO | Admitting: General Surgery

## 2022-11-04 ENCOUNTER — Ambulatory Visit: Payer: BC Managed Care – PPO | Admitting: Podiatry

## 2022-11-04 DIAGNOSIS — Z09 Encounter for follow-up examination after completed treatment for conditions other than malignant neoplasm: Secondary | ICD-10-CM | POA: Diagnosis not present

## 2022-11-04 DIAGNOSIS — E11621 Type 2 diabetes mellitus with foot ulcer: Secondary | ICD-10-CM

## 2022-11-04 DIAGNOSIS — L97426 Non-pressure chronic ulcer of left heel and midfoot with bone involvement without evidence of necrosis: Secondary | ICD-10-CM

## 2022-11-04 DIAGNOSIS — L97312 Non-pressure chronic ulcer of right ankle with fat layer exposed: Secondary | ICD-10-CM

## 2022-11-04 DIAGNOSIS — E11622 Type 2 diabetes mellitus with other skin ulcer: Secondary | ICD-10-CM | POA: Diagnosis not present

## 2022-11-04 DIAGNOSIS — M86272 Subacute osteomyelitis, left ankle and foot: Secondary | ICD-10-CM

## 2022-11-04 LAB — GLUCOSE, CAPILLARY
Glucose-Capillary: 122 mg/dL — ABNORMAL HIGH (ref 70–99)
Glucose-Capillary: 123 mg/dL — ABNORMAL HIGH (ref 70–99)
Glucose-Capillary: 138 mg/dL — ABNORMAL HIGH (ref 70–99)
Glucose-Capillary: 128 mg/dL — ABNORMAL HIGH (ref 70–99)

## 2022-11-04 NOTE — Progress Notes (Signed)
[place], [name] ([id number]) [id number] Page 1 of 2 Visit Report for 11/04/2022 HBO Details Patient Name: Date of Service: [name] 11/04/2022 9:30 A M Medical Record Number: [id number] Patient Account Number: [id number] Date of Birth/Sex: Treating RN: [birth year] (37 y.o. [name] Primary Care : [name] Other Clinician: [name] Referring : Treating /Extender: [name] in Treatment: 158 HBO Treatment Course Details Treatment Course Number: 2 Ordering : Duanne Guess T Treatments Ordered: otal 40 HBO Treatment Start Date: 10/04/2022 HBO Indication: Diabetic Ulcer(s) of the Lower Extremity Wound #3 Left, Lateral, Plantar Foot HBO Treatment Details Treatment Number: 8 Patient Type: Outpatient Chamber Type: Monoplace Chamber Serial #: [id number] Treatment Protocol: 2.0 ATA with 90 minutes oxygen, and no air breaks Treatment Details Compression Rate Down: 2.0 psi / minute De-Compression Rate Up: 2.0 psi / minute Air breaks and breathing Decompress Decompress Compress Tx Pressure Begins Reached periods Begins Ends (leave unused spaces blank) Chamber Pressure (ATA 1 2 ------2 1 ) Clock Time (24 hr) 10:50 11:00 - - - - - - 12:31 12:38 Treatment Length: 108 (minutes) Treatment Segments: 4 Vital Signs Capillary Blood Glucose Reference Range: 80 - 120 mg / dl HBO Diabetic Blood Glucose Intervention Range: <131 mg/dl or >638 mg/dl Type: Time Vitals Blood Pulse: Respiratory Temperature: Capillary Blood Glucose Pulse Action Taken: Pressure: Rate: Glucose (mg/dl): Meter #: Oximetry (%) Taken: Pre 09:52 122 Patient given 8 oz Glucerna 8 oz juice Pre 10:19 123 8 oz orange juice Pre 10:28 122/75 85 18 97.4 Pre 10:39 138 Treatment Response Treatment Toleration: Well Treatment Completion Status: Treatment Completed without Adverse Event Treatment Notes On arrival the  patient blood sugar was 122. The patient was given 8 oz of Glucerna with 8 oz of apple juice. The blood sugar was recheck at 1019 results were 123. Dr. Lady Gary informed ordered 8 oz of Orange juice. Blood sugar was rechecked at 1039 results were 138. The patient was put in the chamber with 8 oz of orange juice if needed per Dr. Lady Gary. Dr. Lady Gary informed of all blood sugar results. No problems noted during treatment today. Additional Procedure Documentation Tissue Sevierity: Necrosis of bone Physician HBO Attestation: I certify that I supervised this HBO treatment in accordance with Medicare guidelines. A trained emergency response team is readily available per Yes hospital policies and procedures. Continue HBOT as ordered. [place], [name] ([id number]) [id number] Page 2 of 2 Electronic Signature(s) Signed: 11/07/2022 7:46:17 AM By: Duanne Guess MD FACS Previous Signature: 11/04/2022 1:10:50 PM Version By: [name] EMT Entered By: Duanne Guess on 11/07/2022 07:46:17 -------------------------------------------------------------------------------- HBO Safety Checklist Details Patient Name: Date of Service: [name], [name] 11/04/2022 9:30 A M Medical Record Number: [id number] Patient Account Number: [id number] Date of Birth/Sex: Treating RN: 10/21/1985 (37 y.o. [name] Primary Care : [name] Other Clinician: [name] Referring : Treating /Extender: [name] in Treatment: 158 HBO Safety Checklist Items Safety Checklist Consent Form Signed Patient voided / foley secured and emptied When did you last eato 0630 Last dose of injectable or oral agent yesterday 40 units Lantus/ 14 units Novalog Ostomy pouch emptied and vented if applicable NA All implantable devices assessed, documented and approved NA Intravenous access site secured and place NA Valuables secured Linens  and cotton and cotton/polyester blend (less than 51% polyester) Personal oil-based products / skin lotions / body lotions removed Wigs or hairpieces removed NA Smoking or tobacco materials removed Books / newspapers /  magazines / loose paper removed Cologne, aftershave, perfume and deodorant removed Jewelry removed (may wrap wedding band) Make-up removed NA Hair care products removed Battery operated devices (external) removed Heating patches and chemical warmers removed Titanium eyewear removed NA Nail polish cured greater than 10 hours NA Casting material cured greater than 10 hours NA Hearing aids removed NA Loose dentures or partials removed NA Prosthetics have been removed NA Patient demonstrates correct use of air break device (if applicable) Patient concerns have been addressed Patient grounding bracelet on and cord attached to chamber Specifics for Inpatients (complete in addition to above) Medication sheet sent with patient NA Intravenous medications needed or due during therapy sent with patient NA Drainage tubes (e.g. nasogastric tube or chest tube secured and vented) NA Endotracheal or Tracheotomy tube secured NA Cuff deflated of air and inflated with saline NA Airway suctioned NA Notes The safety checklist was done before the treatment was started. Electronic Signature(s) Signed: 11/04/2022 1:03:10 PM By: [name] EMT Entered By: [name] on 11/04/2022 13:03:10

## 2022-11-04 NOTE — Progress Notes (Signed)
[place], [name] ([id number]) [id number] Page 1 of 2 Visit Report for 11/04/2022 Arrival Information Details Patient Name: Date of Service: [name] 11/04/2022 9:30 A M Medical Record Number: [id number] Patient Account Number: [id number] Date of Birth/Sex: Treating RN: [birth year] (37 y.o. [name] Primary Care : [name] Other Clinician: [name] Referring : Treating /Extender: [name] in Treatment: 158 Visit Information History Since Last Visit All ordered tests and consults were completed: Yes Patient Arrived: [name] Added or deleted any medications: No Arrival Time: 09:04 Any new allergies or adverse reactions: No Accompanied By: None Had a fall or experienced change in No Transfer Assistance: None activities of daily living that may affect Patient Identification Verified: Yes risk of falls: Secondary Verification Process Completed: Yes Signs or symptoms of abuse/neglect since last visito No Patient Requires Transmission-Based Precautions: No Hospitalized since last visit: No Patient Has Alerts: No Implantable device outside of the clinic excluding No cellular tissue based products placed in the center since last visit: Pain Present Now: No Electronic Signature(s) Signed: 11/04/2022 12:59:20 PM By: [name] EMT Entered By: [name] on 11/04/2022 12:59:19 -------------------------------------------------------------------------------- Encounter Discharge Information Details Patient Name: Date of Service: [name], [name] 11/04/2022 9:30 A M Medical Record Number: [id number] Patient Account Number: [id number] Date of Birth/Sex: Treating RN: 1985/05/23 (37 y.o. [name] Primary Care : [name] Other Clinician: [name] Referring : Treating /Extender: [name] in Treatment:  [phone] Encounter Discharge Information Items Discharge Condition: Stable Ambulatory Status: Walker Discharge Destination: Home Transportation: Private Auto Accompanied By: None Schedule Follow-up Appointment: Yes Clinical Summary of Care: Electronic Signature(s) Signed: 11/04/2022 1:12:07 PM By: [name] EMT Entered By: [name] on 11/04/2022 13:12:07 [place], [name] ([id number]) [id number] Page 2 of 2 -------------------------------------------------------------------------------- Vitals Details Patient Name: Date of Service: [name] 11/04/2022 9:30 A M Medical Record Number: [id number] Patient Account Number: [id number] Date of Birth/Sex: Treating RN: [birth year] (37 y.o. [name] Primary Care : [name] Other Clinician: [name] Referring : Treating /Extender: [name] in Treatment: 158 Vital Signs Time Taken: 09:52 Capillary Blood Glucose (mg/dl): 295 Height (in): 77 Reference Range: 80 - 120 mg / dl Weight (lbs): 188 Body Mass Index (BMI): 33.2 Electronic Signature(s) Signed: 11/04/2022 1:00:23 PM By: [name] EMT Entered By: [name] on 11/04/2022 13:00:23

## 2022-11-06 NOTE — Progress Notes (Signed)
Subjective: Chief Complaint  Patient presents with   Routine Post Op    POV #1 DOS 10/26/22 --- FIFTH METATARSAL RESECTION, BONE BIOPSY LEFT FOOT, DEBRIDEMENT OF ULCER LEFT AND RIGHT WITH POSSIBLE GRAFT APPLICATION   37 year old male presents for above concerns.  He states he had some bleeding on Tuesday the wound care center change the dressing the left foot.  Otherwise he has been doing well.  No fevers or chills or reports.  Still doing hyperbaric oxygen therapy daily.  He has follow-up with infectious disease this week as well.   Objective: AAO x3, NAD DP/PT pulses palpable bilaterally, CRT less than 3 seconds Status post bilateral ulcer debridement.  Grafts are intact with staples within the graft intact.  The left foot incision well coapted with sutures, staples intact.  There is some macerated tissue noted in the left foot.  There is no significant cellulitis.  No fluctuation or crepitation but there is no malodor. 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  -X-rays obtained reviewed left foot.  3 views of the left foot were obtained.  Staples intact.  No definitive evidence of cortical destruction suggest osteomyelitis.Betadine applied to the incision of left foot given macerated tissue.  Adaptic, surgical lube followed by 4 x 4's, Kerlix, Ace bandage applied bilaterally. -Continue with offloading -Antibiotics per ID -Continue HBO with the wound care center.  -Monitor for any clinical signs or symptoms of infection and directed to call the office immediately should any occur or go to the ER. -Patient encouraged to call the office with any questions, concerns, change in symptoms.   Return in about 1 week (around 11/11/2022).  Vivi Barrack DPM

## 2022-11-07 ENCOUNTER — Encounter (HOSPITAL_BASED_OUTPATIENT_CLINIC_OR_DEPARTMENT_OTHER): Payer: BC Managed Care – PPO | Admitting: General Surgery

## 2022-11-07 DIAGNOSIS — E11622 Type 2 diabetes mellitus with other skin ulcer: Secondary | ICD-10-CM | POA: Diagnosis not present

## 2022-11-07 LAB — GLUCOSE, CAPILLARY
Glucose-Capillary: 173 mg/dL — ABNORMAL HIGH (ref 70–99)
Glucose-Capillary: 161 mg/dL — ABNORMAL HIGH (ref 70–99)

## 2022-11-07 NOTE — Progress Notes (Signed)
[place], [name] ([id number]) [id number] Page 1 of 3 Visit Report for 11/04/2022 Problem List Details Patient Name: Date of Service: [name] 11/04/2022 9:30 A M Medical Record Number: [id number] Patient Account Number: [id number] Date of Birth/Sex: Treating RN: Oct 30, 1985 (37 y.o. 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 09/28/2022 No Yes without evidence of necrosis L97.512 Non-pressure chronic ulcer of other part of right foot with fat layer 10/04/2022 No Yes exposed E11.621 Type 2 diabetes mellitus with foot ulcer 10/24/2019 No Yes E11.622 Type 2 diabetes mellitus with other skin ulcer 09/28/2022 No Yes Inactive Problems ICD-10 Code Description Active Date Inactive Date L97.518 Non-pressure chronic ulcer of other part of right foot with other specified 07/14/2020 07/14/2020 severity L97.518 Non-pressure chronic ulcer of other part of right foot with other specified 10/24/2019 10/24/2019 severity 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, right ankle and foot 10/24/2019 10/24/2019 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 08/10/2020 08/10/2020 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, left ankle and foot 10/24/2019 10/24/2019 L97.322 Non-pressure chronic ulcer of left ankle with fat layer exposed 09/29/2021 09/29/2021 [place], [name] ([id number]) [id number].pdf Page 2 of 3 [phone] Non-pressure chronic ulcer of other part of left  foot with fat layer exposed 09/07/2022 09/07/2022 B95.62 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 10/24/2019 10/24/2019 classified elsewhere Resolved Problems ICD-10 Code Description Active Date Resolved Date L89.892 Pressure ulcer of other site, stage 2 07/27/2022 07/27/2022 Electronic Signature(s) Signed: 11/04/2022 1:11:32 PM By: [name] EMT Signed: 11/07/2022 7:45:42 AM By: Duanne Guess MD FACS Entered By: [name] on 11/04/2022 13:11:31 -------------------------------------------------------------------------------- SuperBill Details Patient Name: Date of Service: [name], [name] 11/04/2022 Medical Record Number: [id number] Patient Account Number: [id number] Date of Birth/Sex: Treating RN: 08-17-1985 (37 y.o. 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 without evidence of necrosis L97.512 Non-pressure chronic ulcer of other part of right foot with fat layer exposed E11.621 Type 2 diabetes mellitus with foot ulcer E11.622 Type 2 diabetes mellitus with other skin ulcer Facility Procedures : CPT4 Code: [id number] Description: G0277-(Facility Use Only) HBOT full body chamber, , ICD-10 Diagnosis Description 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 M86.172 Other acute  osteomyelitis, left ankle and foot E11.622 Type 2 diabetes mellitus with other skin ulcer Modifier: one Quantity: 4 Physician Procedures : CPT4 Code Description Modifier  [id number] - WC PHYS HYPERBARIC OXYGEN THERAPY ICD-10 Diagnosis Description 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

## 2022-11-07 NOTE — Progress Notes (Addendum)
[place], [name] ([id number]) [id number] Page 1 of 3 Visit Report for 11/07/2022 HBO Details Patient Name: Date of Service: [name] 11/07/2022 10:00 A M Medical Record Number: [id number] Patient Account Number: [id number] Date of Birth/Sex: Treating RN: [birth year] (37 y.o. [name], [name] Primary Care : [name] Other Clinician: Haywood Pao Referring : Treating /Extender: [name] in Treatment: 158 HBO Treatment Course Details Treatment Course Number: 2 Ordering : Duanne Guess T Treatments Ordered: otal 40 HBO Treatment Start Date: 10/04/2022 HBO Indication: Diabetic Ulcer(s) of the Lower Extremity Wound #3 Left, Lateral, Plantar Foot HBO Treatment Details Treatment Number: 9 Patient Type: Outpatient Chamber Type: Monoplace Chamber Serial #: [id number] Treatment Protocol: 2.0 ATA with 90 minutes oxygen, and no air breaks Treatment Details Compression Rate Down: 1.5 psi / minute De-Compression Rate Up: 2.0 psi / minute Air breaks and breathing Decompress Decompress Compress Tx Pressure Begins Reached periods Begins Ends (leave unused spaces blank) Chamber Pressure (ATA 1 2 ------2 1 ) Clock Time (24 hr) 10:39 10:50 - - - - - - 12:20 12:27 Treatment Length: 108 (minutes) Treatment Segments: 4 Vital Signs Capillary Blood Glucose Reference Range: 80 - 120 mg / dl HBO Diabetic Blood Glucose Intervention Range: <131 mg/dl or >782 mg/dl Type: Time Vitals Blood Respiratory Capillary Blood Glucose Pulse Action Pulse: Temperature: Taken: Pressure: Rate: Glucose (mg/dl): Meter #: Oximetry (%) Taken: Pre 10:27 135/77 97 18 97.2 173 none per protocol Post 12:32 140/81 80 18 97.7 161 none per protocol Treatment Response Treatment Toleration: Well Treatment Completion Status: Treatment Completed without Adverse Event Treatment Notes [name] arrived with vital signs  that were within normal range. He prepared for treatment. After performing a safety check, he was placed in the chamber which was compressed at a rate of 2 after confirming normal ear equalization. He tolerated the treatment and subsequent decompression of the chamber at the rate of 2 psi/min. He denied any issues with ear equalization and/or pain associated with barotrauma. His post-treatment vital signs were within normal range. He was stable upon discharge. Additional Procedure Documentation Tissue Sevierity: Necrosis of bone Physician HBO Attestation: I certify that I supervised this HBO treatment in accordance with Medicare guidelines. A trained emergency response team is readily available per Yes hospital policies and procedures. Continue HBOT as ordered. [name], [name] ([id number]) [id number] Page 2 of 3 Electronic Signature(s) Signed: 11/07/2022 2:20:05 PM By: Duanne Guess MD FACS Previous Signature: 11/07/2022 2:19:35 PM Version By: Duanne Guess MD FACS Previous Signature: 11/07/2022 1:14:11 PM Version By: Haywood Pao CHT EMT BS , , Previous Signature: 11/07/2022 12:03:02 PM Version By: Haywood Pao CHT EMT BS , , Entered By: Duanne Guess on 11/07/2022 14:20:05 -------------------------------------------------------------------------------- HBO Safety Checklist Details Patient Name: Date of Service: [name], [name] 11/07/2022 10:00 A M Medical Record Number: [id number] Patient Account Number: [id number] Date of Birth/Sex: Treating RN: 08/07/1985 (37 y.o. [name] Primary Care : [name] Other Clinician: Haywood Pao Referring : Treating /Extender: [name] in Treatment: 158 HBO Safety Checklist Items Safety Checklist Consent Form Signed Patient voided / foley secured and emptied When did you last eato 0730 - Rice and Orange Juice. Last dose of injectable or  oral agent 1930 fast acting - 15 Units Ostomy pouch emptied and vented if applicable NA All implantable devices assessed, documented and approved Freestyle Libre 2 and3 on approved list Intravenous access site secured and place NA Valuables secured Linens and cotton and  cotton/polyester blend (less than 51% polyester) Personal oil-based products / skin lotions / body lotions removed Wigs or hairpieces removed Smoking or tobacco materials removed Books / newspapers / magazines / loose paper removed Cologne, aftershave, perfume and deodorant removed Jewelry removed (may wrap wedding band) Make-up removed NA Hair care products removed Libre Freestyle 2 and 3 Sensor Approved, other Battery operated devices (external) removed electronics removed. Heating patches and chemical warmers removed Titanium eyewear removed Nail polish cured greater than 10 hours NA Casting material cured greater than 10 hours NA Hearing aids removed NA Loose dentures or partials removed NA Prosthetics have been removed NA Patient demonstrates correct use of air break device (if applicable) Patient concerns have been addressed Patient grounding bracelet on and cord attached to chamber Specifics for Inpatients (complete in addition to above) Medication sheet sent with patient NA Intravenous medications needed or due during therapy sent with patient NA Drainage tubes (e.g. nasogastric tube or chest tube secured and vented) NA Endotracheal or Tracheotomy tube secured NA Cuff deflated of air and inflated with saline NA Airway suctioned NA Notes Paper version used prior to treatment start. Electronic Signature(s) Signed: 11/07/2022 12:26:55 PM By: Haywood Pao CHT EMT BS , , Previous Signature: 11/07/2022 11:59:33 AM Version By: Haywood Pao CHT EMT BS , , Entered By: Haywood Pao on 11/07/2022 12:26:55 [name], [name] ([id number]) [id number] Page 3 of 3

## 2022-11-07 NOTE — Progress Notes (Signed)
[place], [name] ([id number]) [id number] Page 1 of 1 Visit Report for 11/07/2022 SuperBill Details Patient Name: Date of Service: [name] 11/07/2022 Medical Record Number: [id number] Patient Account Number: [id number] Date of Birth/Sex: Treating RN: January 15, 1986 (37 y.o. 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 without evidence of necrosis L97.512 Non-pressure chronic ulcer of other part of right foot with fat layer exposed E11.621 Type 2 diabetes mellitus with foot ulcer E11.622 Type 2 diabetes mellitus with other skin ulcer Facility Procedures CPT4 Code Description Modifier Quantity [id number] G0277-(Facility Use Only) HBOT full body chamber, , 4 ICD-10 Diagnosis Description 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 M86.172 Other acute osteomyelitis, left ankle and foot E11.622 Type 2 diabetes mellitus with other skin ulcer Physician Procedures Quantity CPT4 Code Description Modifier [id number] - WC PHYS HYPERBARIC OXYGEN THERAPY 1 ICD-10 Diagnosis Description 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

## 2022-11-07 NOTE — Op Note (Signed)
PATIENT:  [name]  37 y.o. 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   PROCEDURE:  Left 5th metatarsal excision; bone biopsy cuboid; ulcer debridement bilateral with A-Cell graft application   SURGEON:  Surgeons and Role:    * Vivi Barrack, DPM - Primary   PHYSICIAN ASSISTANT:    ASSISTANTS: none    ANESTHESIA:   general   EBL:  40ml    BLOOD ADMINISTERED:none   DRAINS: none    LOCAL MEDICATIONS USED:  MARCAINE   , BUPIVICAINE , and Amount: 25 ml   SPECIMEN:  Source of Specimen:  left 5th metatarsal for pathology; left cuboid for pathology and microbiology   DISPOSITION OF SPECIMEN:  PATHOLOGY   COUNTS:  YES   TOURNIQUET:   Total Tourniquet Time Documented: Ankle (Left) - 23 minutes Total: Ankle (Left) - 23 minutes     DICTATION: .Reubin Milan Dictation   PLAN OF CARE: Discharge to home after PACU   PATIENT DISPOSITION:  PACU - hemodynamically stable.   Delay start of Pharmacological VTE agent (>24hrs) due to surgical blood loss or risk of bleeding: no   Intraoperative findings: Excised left 5th metatarsal. Cuboid appeared soft and culture/path obtained. Debrided b/l ulcerations and graft applied   Right 2.5 x 2.5 x 1 cm Left 4.2 x 2.8 x 2 cm  Indications for surgery: 37 year old male presented to the office with worsening ulceration of his left foot noted exposed bone and he also developed a wound on the anterior aspect of right ankle from rubbing inside of the surgical shoe.  Due to the infection and to help prevent further potation but discussed excision of the fifth metatarsal, bone biopsy as well as wound debridement, graft application.  Unfortunately he is still at risk of amputation which he is aware of.  All alternatives, risks,  complications were discussed.  No promises or guarantees given aspect of the procedure and all questions were answered best my ability.  Procedure in detail: The patient is a verbally and visually identified by myself, nursing staff, anesthesia staff preoperatively.  He was then transferred to the operating room via stretcher and placed on operative table in supine position.  After adequate plane of anesthesia was obtained bilateral lower EXTR was then scrubbed, prepped, draped in normal sterile fashion.  Attention was first directed to the left foot.  Incision was made along the fifth metatarsal from skin to bone with a #15 blade scalpel.  Soft tissue structures were then freed from the fifth metatarsal.  There is found to be extensive remodeling noted along the fifth metatarsal.  I was able to identify the metatarsal cuboid joint and dissection was then carried around the metatarsal and metatarsal was excised.  There was no purulence noted.  Due to bleeding during the procedure the tourniquet was inflated without exsanguination.  Once I was able to remove the metatarsal I took a bone biopsy of the cuboid utilizing a rongeur and measure this to pathology as well as microbiology.  There is no purulence noted along the fourth metatarsal cuboid joint there is remaining bone at.  Be viable along the fourth metatarsal.  Cuboid was somewhat soft.  It was white in color however.  At this time I would copiously irrigated the incision and hemostasis was achieved.  I then closed the incision with skin staples as well as nylon.  I also did debride the ulceration that was present.  After I debrided the wound it measured 4.2 x 2.8 x 2 cm.  Prior to this the wound was smaller measuring 4 x 2.5 x 1.5 cm.  I utilized a 15 blade scalpel to debride the wound down to healthy tissue.  Strength was released to make sure that there was hemostasis.  I copiously irrigated with saline hemostasis achieved.  I then applied 500 mL  followed by ACell Cytol sheet which was secured in place with skin staples.  Adaptic was applied followed by surgery with an transparent dressing.  Patient was directed to the right side along the interest of the ankle where there is an ulceration which is full-thickness down to the tendon.  There is no purulence or surrounding erythema.  After debridement the ulcer measures 2.5 x 2.5 x 1 cm prior to debridement it was smaller measuring 2.2 x 2 x 0.8 cm.  I debrided the ulcer with a 15 with scalpel.  Copious irrigated with saline hemostasis achieved.  I applied BX followed by remaining Cytal sheet which was secured in place by Skains staples.  Dr. Was applied followed by a surgical lube and a dry sterile dressing.  Patient will communicate about regarding the procedure and complications.  He was then transferred to PACU vital signs stable and vascular status intact.

## 2022-11-07 NOTE — Progress Notes (Addendum)
[name] ([id number]) [id number] Page 1 of 2 Visit Report for 11/07/2022 Arrival Information Details Patient Name: Date of Service: [name] 11/07/2022 10:00 A M Medical Record Number: [id number] Patient Account Number: [id number] Date of Birth/Sex: Treating RN: 12/06/1985 (37 y.o. [name], [name] Primary Care : [name] Other Clinician: Haywood Pao Referring : Treating /Extender: [name] in Treatment: 158 Visit Information History Since Last Visit All ordered tests and consults were completed: Yes Patient Arrived: [name] Added or deleted any medications: No Arrival Time: 09:59 Any new allergies or adverse reactions: No Accompanied By: self Had a fall or experienced change in No Transfer Assistance: None activities of daily living that may affect Patient Identification Verified: Yes risk of falls: Secondary Verification Process Completed: Yes Signs or symptoms of abuse/neglect since last visito No Patient Requires Transmission-Based Precautions: No Hospitalized since last visit: No Patient Has Alerts: No Implantable device outside of the clinic excluding No cellular tissue based products placed in the center since last visit: Pain Present Now: No Electronic Signature(s) Signed: 11/07/2022 11:49:33 AM By: Haywood Pao CHT EMT BS , , Entered By: Haywood Pao on 11/07/2022 11:49:33 -------------------------------------------------------------------------------- Encounter Discharge Information Details Patient Name: Date of Service: [name], [name] 11/07/2022 10:00 A M Medical Record Number: [id number] Patient Account Number: [id number] Date of Birth/Sex: Treating RN: 26-Aug-1985 (37 y.o. [name] Primary Care : [name] Other Clinician: Haywood Pao Referring : Treating /Extender: [name]  in Treatment: 158 Encounter Discharge Information Items Discharge Condition: Stable Ambulatory Status: Ambulatory Discharge Destination: Home Transportation: Private Auto Accompanied By: self Schedule Follow-up Appointment: No Clinical Summary of Care: Electronic Signature(s) Signed: 11/07/2022 1:15:16 PM By: Haywood Pao CHT EMT BS , , Entered By: Haywood Pao on 11/07/2022 13:15:16 [place], [name] ([id number]) [id number] Page 2 of 2 -------------------------------------------------------------------------------- Vitals Details Patient Name: Date of Service: [name] 11/07/2022 10:00 A M Medical Record Number: [id number] Patient Account Number: [id number] Date of Birth/Sex: Treating RN: 06-17-1985 (37 y.o. [name] Primary Care : [name] Other Clinician: Haywood Pao Referring : Treating /Extender: [name] in Treatment: 158 Vital Signs Time Taken: 10:27 Temperature (F): 97.2 Height (in): 77 Pulse (bpm): 97 Weight (lbs): 280 Respiratory Rate (breaths/min): 18 Body Mass Index (BMI): 33.2 Blood Pressure (mmHg): 135/77 Capillary Blood Glucose (mg/dl): 433 Reference Range: 80 - 120 mg / dl Electronic Signature(s) Signed: 11/07/2022 11:50:06 AM By: Haywood Pao CHT EMT BS , , Entered By: Haywood Pao on 11/07/2022 11:50:06

## 2022-11-08 ENCOUNTER — Encounter (HOSPITAL_BASED_OUTPATIENT_CLINIC_OR_DEPARTMENT_OTHER): Payer: BC Managed Care – PPO | Admitting: General Surgery

## 2022-11-08 DIAGNOSIS — E11622 Type 2 diabetes mellitus with other skin ulcer: Secondary | ICD-10-CM | POA: Diagnosis not present

## 2022-11-08 LAB — GLUCOSE, CAPILLARY
Glucose-Capillary: 122 mg/dL — ABNORMAL HIGH (ref 70–99)
Glucose-Capillary: 162 mg/dL — ABNORMAL HIGH (ref 70–99)
Glucose-Capillary: 198 mg/dL — ABNORMAL HIGH (ref 70–99)

## 2022-11-08 NOTE — Progress Notes (Signed)
[place], [name] ([id number]) [id number] Page 1 of 3 Visit Report for 11/08/2022 Problem List Details Patient Name: Date of Service: [name] 11/08/2022 10:00 A M Medical Record Number: [id number] Patient Account Number: [id number] Date of Birth/Sex: Treating RN: 08/10/1985 (37 y.o. [name] Primary Care Provider: Renford [name] Other Clinician: Karl [name] Referring Provider: Treating Provider/Extender: [name] in Treatment: 158 Active Problems ICD-10 Encounter Code Description Active Date MDM Diagnosis L97.524 Non-pressure chronic ulcer of other part of left foot with necrosis of 10/24/2019 No Yes bone M86.172 Other acute osteomyelitis, left ankle and foot 09/14/2022 No Yes 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 09/28/2022 No Yes without evidence of necrosis L97.512 Non-pressure chronic ulcer of other part of right foot with fat layer 10/04/2022 No Yes exposed E11.621 Type 2 diabetes mellitus with foot ulcer 10/24/2019 No Yes E11.622 Type 2 diabetes mellitus with other skin ulcer 09/28/2022 No Yes Inactive Problems ICD-10 Code Description Active Date Inactive Date L97.518 Non-pressure chronic ulcer of other part of right foot with other specified 07/14/2020 07/14/2020 severity L97.518 Non-pressure chronic ulcer of other part of right foot with other specified 10/24/2019 10/24/2019 severity 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, right ankle and foot 10/24/2019 10/24/2019 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 08/10/2020 08/10/2020 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, left ankle and foot 10/24/2019 10/24/2019 L97.322 Non-pressure chronic ulcer of left ankle with fat layer exposed 09/29/2021 09/29/2021 [place], [name] ([id number]) [id number].pdf Page 2 of 3 [id number] Non-pressure chronic ulcer of other part of left  foot with fat layer exposed 09/07/2022 09/07/2022 B95.62 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 10/24/2019 10/24/2019 classified elsewhere Resolved Problems ICD-10 Code Description Active Date Resolved Date L89.892 Pressure ulcer of other site, stage 2 07/27/2022 07/27/2022 Electronic Signature(s) Signed: 11/08/2022 1:09:56 PM By: [name] EMT Signed: 11/08/2022 1:47:37 PM By: Duanne Guess MD FACS Entered By: [name] on 11/08/2022 13:09:56 -------------------------------------------------------------------------------- SuperBill Details Patient Name: Date of Service: [name], [name] 11/08/2022 Medical Record Number: [id number] Patient Account Number: [id number] Date of Birth/Sex: Treating RN: May 11, 1985 (37 y.o. [name] Primary Care Provider: Renford [name] Other Clinician: Karl [name] Referring Provider: Treating Provider/Extender: [name] in Treatment: 158 Diagnosis Coding ICD-10 Codes Code Description [id number] 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one M86.172 Other acute osteomyelitis, left ankle and foot 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 without evidence of necrosis L97.512 Non-pressure chronic ulcer of other part of right foot with fat layer exposed E11.621 Type 2 diabetes mellitus with foot ulcer E11.622 Type 2 diabetes mellitus with other skin ulcer Facility Procedures : CPT4 Code: [id number] Description: G0277-(Facility Use Only) HBOT full body chamber, , ICD-10 Diagnosis Description 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 M86.172 Other acute  osteomyelitis, left ankle and foot E11.622 Type 2 diabetes mellitus with other skin ulcer Modifier: one Quantity: 4 Physician Procedures : CPT4 Code Description Modifier  [id number] - WC PHYS HYPERBARIC OXYGEN THERAPY ICD-10 Diagnosis Description 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

## 2022-11-08 NOTE — Progress Notes (Addendum)
[place], [name] ([id number]) [id number] Page 1 of 2 Visit Report for 11/08/2022 Arrival Information Details Patient Name: Date of Service: [name] 11/08/2022 10:00 A M Medical Record Number: [id number] Patient Account Number: [id number] Date of Birth/Sex: Treating RN: 08/10/1985 (37 y.o. [name] Primary Care : [name] Other Clinician: [name] Referring : Treating /Extender: [name] in Treatment: 158 Visit Information History Since Last Visit All ordered tests and consults were completed: Yes Patient Arrived: [name] Added or deleted any medications: No Arrival Time: 09:45 Any new allergies or adverse reactions: No Accompanied By: None Had a fall or experienced change in No Transfer Assistance: None activities of daily living that may affect Patient Identification Verified: Yes risk of falls: Secondary Verification Process Completed: Yes Signs or symptoms of abuse/neglect since last visito No Patient Requires Transmission-Based Precautions: No Hospitalized since last visit: No Patient Has Alerts: No Implantable device outside of the clinic excluding No cellular tissue based products placed in the center since last visit: Pain Present Now: No Electronic Signature(s) Signed: 11/08/2022 11:51:33 AM By: [name] EMT Entered By: [name] on 11/08/2022 11:51:33 -------------------------------------------------------------------------------- Encounter Discharge Information Details Patient Name: Date of Service: [name], [name] 11/08/2022 10:00 A M Medical Record Number: [id number] Patient Account Number: [id number] Date of Birth/Sex: Treating RN: [birth year] (37 y.o. [name] Primary Care : [name] Other Clinician: [name] Referring : Treating /Extender: [name] in Treatment:  158 Encounter Discharge Information Items Discharge Condition: Stable Ambulatory Status: Walker Discharge Destination: Home Transportation: Private Auto Accompanied By: None Schedule Follow-up Appointment: Yes Clinical Summary of Care: Electronic Signature(s) Signed: 11/08/2022 1:10:32 PM By: [name] EMT Entered By: [name] on 11/08/2022 13:10:32 [place], [name] ([id number]) [id number] Page 2 of 2 -------------------------------------------------------------------------------- Vitals Details Patient Name: Date of Service: [name] 11/08/2022 10:00 A M Medical Record Number: [id number] Patient Account Number: [id number] Date of Birth/Sex: Treating RN: 01/08/1986 (37 y.o. [name] Primary Care : [name] Other Clinician: [name] Referring : Treating /Extender: [name] in Treatment: 158 Vital Signs Time Taken: 10:10 Temperature (F): 98.8 Height (in): 77 Pulse (bpm): 88 Weight (lbs): 280 Respiratory Rate (breaths/min): 20 Body Mass Index (BMI): 33.2 Blood Pressure (mmHg): 107/70 Capillary Blood Glucose (mg/dl): 301 Reference Range: 80 - 120 mg / dl Electronic Signature(s) Signed: 11/08/2022 11:52:07 AM By: [name] EMT Entered By: [name] on 11/08/2022 11:52:07

## 2022-11-08 NOTE — Progress Notes (Addendum)
[place], [name] ([id number]) [id number] Page 1 of 2 Visit Report for 11/08/2022 HBO Details Patient Name: Date of Service: [name] D 11/08/2022 10:00 A M Medical Record Number: [id number] Patient Account Number: [id number] Date of Birth/Sex: Treating RN: 04-13-1985 (37 y.o. [name] Primary Care : [name] Other Clinician: [name] Referring : Treating /Extender: [name] in Treatment: 158 HBO Treatment Course Details Treatment Course Number: 2 Ordering : Duanne Guess T Treatments Ordered: otal 40 HBO Treatment Start Date: 10/04/2022 HBO Indication: Diabetic Ulcer(s) of the Lower Extremity Wound #3 Left, Lateral, Plantar Foot HBO Treatment Details Treatment Number: 10 Patient Type: Outpatient Chamber Type: Monoplace Chamber Serial #: [id number] Treatment Protocol: 2.0 ATA with 90 minutes oxygen, and no air breaks Treatment Details Compression Rate Down: 1.5 psi / minute De-Compression Rate Up: 2.0 psi / minute Air breaks and breathing Decompress Decompress Compress Tx Pressure Begins Reached periods Begins Ends (leave unused spaces blank) Chamber Pressure (ATA 1 2 ------2 1 ) Clock Time (24 hr) 10:56 11:06 - - - - - - 12:36 12:44 Treatment Length: 108 (minutes) Treatment Segments: 4 Vital Signs Capillary Blood Glucose Reference Range: 80 - 120 mg / dl HBO Diabetic Blood Glucose Intervention Range: <131 mg/dl or >086 mg/dl Time Vitals Blood Respiratory Capillary Blood Glucose Pulse Action Type: Pulse: Temperature: Taken: Pressure: Rate: Glucose (mg/dl): Meter #: Oximetry (%) Taken: Pre 10:10 107/70 88 20 98.8 122 Pre 10:33 162 Post 12:47 137/87 79 18 97.5 198 Treatment Response Treatment Toleration: Well Treatment Completion Status: Treatment Completed without Adverse Event Treatment Notes On arrival the patient blood sugar was 122, he was given  8oz of juice with 8 oz of Glucerna per orders. Dr. Lady Gary informed. The blood sugar was rechecked at 1033 results were 162. Additional Procedure Documentation Tissue Sevierity: Necrosis of bone Physician HBO Attestation: I certify that I supervised this HBO treatment in accordance with Medicare guidelines. A trained emergency response team is readily available per Yes hospital policies and procedures. Continue HBOT as ordered. Yes Electronic Signature(s) [place], [name] ([id number]) [id number] Page 2 of 2 Signed: 11/08/2022 4:08:05 PM By: Duanne Guess MD FACS Previous Signature: 11/08/2022 1:09:15 PM Version By: [name] EMT Previous Signature: 11/08/2022 11:58:51 AM Version By: [name] EMT Entered By: Duanne Guess on 11/08/2022 16:08:05 -------------------------------------------------------------------------------- HBO Safety Checklist Details Patient Name: Date of Service: [name], [name] 11/08/2022 10:00 A M Medical Record Number: [id number] Patient Account Number: [id number] Date of Birth/Sex: Treating RN: 08-09-1985 (37 y.o. [name] Primary Care : [name] Other Clinician: [name] Referring : Treating /Extender: [name] in Treatment: 158 HBO Safety Checklist Items Safety Checklist Consent Form Signed Patient voided / foley secured and emptied When did you last eato 0830 Last dose of injectable or oral agent Yesterday Novalog 15 units at 1500 Ostomy pouch emptied and vented if applicable NA All implantable devices assessed, documented and approved NA Intravenous access site secured and place NA Valuables secured Linens and cotton and cotton/polyester blend (less than 51% polyester) Personal oil-based products / skin lotions / body lotions removed Wigs or hairpieces removed NA Smoking or tobacco materials removed Books / newspapers / magazines / loose  paper removed Cologne, aftershave, perfume and deodorant removed Jewelry removed (may wrap wedding band) Make-up removed NA Hair care products removed Battery operated devices (external) removed Heating patches and chemical warmers removed Titanium eyewear removed NA Nail polish cured greater than 10 hours NA Casting material  cured greater than 10 hours NA Hearing aids removed NA Loose dentures or partials removed NA Prosthetics have been removed NA Patient demonstrates correct use of air break device (if applicable) Patient concerns have been addressed Patient grounding bracelet on and cord attached to chamber Specifics for Inpatients (complete in addition to above) Medication sheet sent with patient NA Intravenous medications needed or due during therapy sent with patient NA Drainage tubes (e.g. nasogastric tube or chest tube secured and vented) NA Endotracheal or Tracheotomy tube secured NA Cuff deflated of air and inflated with saline NA Airway suctioned NA Notes The safety checklist was done before the treatment was started. Electronic Signature(s) Signed: 11/08/2022 11:53:31 AM By: [name] EMT Entered By: [name] on 11/08/2022 11:53:30

## 2022-11-09 ENCOUNTER — Encounter (HOSPITAL_BASED_OUTPATIENT_CLINIC_OR_DEPARTMENT_OTHER): Payer: BC Managed Care – PPO | Admitting: General Surgery

## 2022-11-09 DIAGNOSIS — E11622 Type 2 diabetes mellitus with other skin ulcer: Secondary | ICD-10-CM | POA: Diagnosis not present

## 2022-11-09 LAB — GLUCOSE, CAPILLARY
Glucose-Capillary: 108 mg/dL — ABNORMAL HIGH (ref 70–99)
Glucose-Capillary: 142 mg/dL — ABNORMAL HIGH (ref 70–99)
Glucose-Capillary: 135 mg/dL — ABNORMAL HIGH (ref 70–99)

## 2022-11-09 NOTE — Progress Notes (Signed)
[place], [name] ([id number]) [id number] Page 1 of 3 Visit Report for 11/09/2022 HBO Details Patient Name: Date of Service: [name] D 11/09/2022 10:00 A M Medical Record Number: [id number] Patient Account Number: [id number] Date of Birth/Sex: Treating RN: Jan 01, 1986 (37 y.o. [name] Primary Care : [name] Other Clinician: Haywood Pao Referring : Treating /Extender: [name] in Treatment: 158 HBO Treatment Course Details Treatment Course Number: 2 Ordering : Duanne Guess T Treatments Ordered: otal 40 HBO Treatment Start Date: 10/04/2022 HBO Indication: Diabetic Ulcer(s) of the Lower Extremity Wound #3 Left, Lateral, Plantar Foot HBO Treatment Details Treatment Number: 11 Patient Type: Outpatient Chamber Type: Monoplace Chamber Serial #: [id number] Treatment Protocol: 2.0 ATA with 90 minutes oxygen, and no air breaks Treatment Details Compression Rate Down: 1.5 psi / minute De-Compression Rate Up: 2.0 psi / minute Air breaks and breathing Decompress Decompress Compress Tx Pressure Begins Reached periods Begins Ends (leave unused spaces blank) Chamber Pressure (ATA 1 2 ------2 1 ) Clock Time (24 hr) 11:05 11:15 - - - - - - 12:45 12:53 Treatment Length: 108 (minutes) Treatment Segments: 4 Vital Signs Capillary Blood Glucose Reference Range: 80 - 120 mg / dl HBO Diabetic Blood Glucose Intervention Range: <131 mg/dl or >027 mg/dl Type: Time Vitals Blood Pulse: Respiratory Temperature: Capillary Blood Glucose Pulse Action Taken: Pressure: Rate: Glucose (mg/dl): Meter #: Oximetry (%) Taken: Pre 10:51 129/81 93 20 97.1 108 1 Glucerna (8 oz) and 2 apple juices (4 oz) Pre 10:38 142 Proceed with HBO Tx Post 12:58 145/100 76 18 97.3 135 manual BP taken Post 12:58 160/98 manual BP Treatment Response Treatment Toleration: Well Treatment Completion Status: Treatment  Completed without Adverse Event Treatment Notes Patient arrived with a blood glucose of 108 mg/dL. Patient was given 8 oz Glucerna and 2 x 4 oz Apple Juice per physician orders. Blood glucose was measured again at 1038 with a result of 142 mg/dL. He prepared for treatment. After performing a safety check, he was placed in the chamber which was compressed with 100% oxygen at a rate of 2 psi/min after confirming normal ear equalization. He tolerated the treatment and subsequent decompression at a rate of 2 psi/min. He denied any issues with ear equalization and/or pain associated with barotrauma. His post-treatment vital signs were within normal range except diastolic BP (100 mmHg). BP was re-taken prior to departure with a result within range at 160/98 mmHg. He was stable upon discharge. Additional Procedure Documentation Tissue Sevierity: Necrosis of bone Physician HBO Attestation: I certify that I supervised this HBO treatment in accordance with Medicare guidelines. A trained emergency response team is readily available per Yes hospital policies and procedures. Continue HBOT as ordered. [place], [name] ([id number]) [id number] Page 2 of 3 Electronic Signature(s) Signed: 11/10/2022 8:17:51 AM By: Duanne Guess MD FACS Previous Signature: 11/09/2022 2:19:22 PM Version By: Haywood Pao CHT EMT BS , , Previous Signature: 11/09/2022 2:16:08 PM Version By: Haywood Pao CHT EMT BS , , Entered By: Duanne Guess on 11/10/2022 08:17:51 -------------------------------------------------------------------------------- HBO Safety Checklist Details Patient Name: Date of Service: [name], [name] 11/09/2022 10:00 A M Medical Record Number: [id number] Patient Account Number: [id number] Date of Birth/Sex: Treating RN: 07/12/1985 (37 y.o. [name] Primary Care : [name] Other Clinician: Haywood Pao Referring : Treating  /Extender: [name] in Treatment: 158 HBO Safety Checklist Items Safety Checklist Consent Form Signed Patient voided / foley secured and emptied When did you last  eato 0900 - Protein Shake and OJ Last dose of injectable or oral agent Yesterday 1400 60 Units of Lantus Ostomy pouch emptied and vented if applicable NA All implantable devices assessed, documented and approved Freestyle Libre 2 and3 on approved list Intravenous access site secured and place NA Valuables secured Linens and cotton and cotton/polyester blend (less than 51% polyester) Personal oil-based products / skin lotions / body lotions removed Wigs or hairpieces removed NA Smoking or tobacco materials removed NA Books / newspapers / magazines / loose paper removed Cologne, aftershave, perfume and deodorant removed Jewelry removed (may wrap wedding band) Make-up removed NA Hair care products removed Libre Freestyle 2 and 3 Sensor Approved, other Battery operated devices (external) removed electronics removed. Heating patches and chemical warmers removed NA Titanium eyewear removed NA Nail polish cured greater than 10 hours NA Casting material cured greater than 10 hours NA Hearing aids removed NA Loose dentures or partials removed NA Prosthetics have been removed NA Patient demonstrates correct use of air break device (if applicable) Patient concerns have been addressed Patient grounding bracelet on and cord attached to chamber Specifics for Inpatients (complete in addition to above) Medication sheet sent with patient NA Intravenous medications needed or due during therapy sent with patient NA Drainage tubes (e.g. nasogastric tube or chest tube secured and vented) NA Endotracheal or Tracheotomy tube secured NA Cuff deflated of air and inflated with saline NA Airway suctioned NA Notes Paper version used prior to treatment start. Electronic  Signature(s) Signed: 11/09/2022 12:15:18 PM By: Haywood Pao CHT EMT BS , , Entered By: Haywood Pao on 11/09/2022 12:15:18 [place], [name] ([id number]) [id number] Page 3 of 3

## 2022-11-09 NOTE — Progress Notes (Addendum)
[place], [name] ([id number]) [id number] Page 1 of 2 Visit Report for 11/09/2022 Arrival Information Details Patient Name: Date of Service: [name] 11/09/2022 10:00 A M Medical Record Number: [id number] Patient Account Number: [id number] Date of Birth/Sex: Treating RN: [name] (37 y.o. [name], [name] Primary Care : [name] Other Clinician: Haywood Pao Referring : Treating /Extender: [name] in Treatment: 158 Visit Information History Since Last Visit All ordered tests and consults were completed: Yes Patient Arrived: [name] Added or deleted any medications: No Arrival Time: 09:49 Any new allergies or adverse reactions: No Accompanied By: self Had a fall or experienced change in No Transfer Assistance: None activities of daily living that may affect Patient Identification Verified: Yes risk of falls: Secondary Verification Process Completed: Yes Signs or symptoms of abuse/neglect since last visito No Patient Requires Transmission-Based Precautions: No Hospitalized since last visit: No Patient Has Alerts: No Implantable device outside of the clinic excluding No cellular tissue based products placed in the center since last visit: Pain Present Now: No Electronic Signature(s) Signed: 11/09/2022 2:22:10 PM By: Haywood Pao CHT EMT BS , , Previous Signature: 11/09/2022 12:13:13 PM Version By: Haywood Pao CHT EMT BS , , Entered By: Haywood Pao on [name]/10/2022 14:22:10 -------------------------------------------------------------------------------- Encounter Discharge Information Details Patient Name: Date of Service: [name], [name] 11/09/2022 10:00 A M Medical Record Number: [id number] Patient Account Number: [id number] Date of Birth/Sex: Treating RN: [name] (37 y.o. [name] Primary Care : [name] Other Clinician: Haywood Pao Referring : Treating /Extender: [name] in Treatment: 158 Encounter Discharge Information Items Discharge Condition: Stable Ambulatory Status: Walker Discharge Destination: Home Transportation: Private Auto Accompanied By: self Schedule Follow-up Appointment: No Clinical Summary of Care: Electronic Signature(s) Signed: 11/09/2022 2:21:51 PM By: Haywood Pao CHT EMT BS , , Entered By: Haywood Pao on [name]/10/2022 14:21:51 [place], [name] ([id number]) [id number] Page 2 of 2 -------------------------------------------------------------------------------- Vitals Details Patient Name: Date of Service: [name] 11/09/2022 10:00 A M Medical Record Number: [id number] Patient Account Number: [id number] Date of Birth/Sex: Treating RN: [name], [name] (37 y.o. [name] Primary Care : [name] Other Clinician: Haywood Pao Referring : Treating /Extender: [name] in Treatment: 158 Vital Signs Time Taken: 10:51 Temperature (F): 97.1 Height (in): 77 Pulse (bpm): 93 Weight (lbs): 280 Respiratory Rate (breaths/min): 20 Body Mass Index (BMI): 33.2 Blood Pressure (mmHg): 129/81 Capillary Blood Glucose (mg/dl): 500 Reference Range: 80 - 120 mg / dl Electronic Signature(s) Signed: 11/09/2022 12:13:42 PM By: Haywood Pao CHT EMT BS , , Entered By: Haywood Pao on [name]/10/2022 12:13:41

## 2022-11-10 ENCOUNTER — Encounter (HOSPITAL_BASED_OUTPATIENT_CLINIC_OR_DEPARTMENT_OTHER): Payer: BC Managed Care – PPO | Admitting: General Surgery

## 2022-11-10 DIAGNOSIS — E11622 Type 2 diabetes mellitus with other skin ulcer: Secondary | ICD-10-CM | POA: Diagnosis not present

## 2022-11-10 LAB — GLUCOSE, CAPILLARY
Glucose-Capillary: 165 mg/dL — ABNORMAL HIGH (ref 70–99)
Glucose-Capillary: 108 mg/dL — ABNORMAL HIGH (ref 70–99)

## 2022-11-10 NOTE — Progress Notes (Signed)
[place], [name] ([id number]) [id number] Page 1 of 1 Visit Report for 11/09/2022 SuperBill Details Patient Name: Date of Service: [name] 11/09/2022 Medical Record Number: [id number] Patient Account Number: [id number] Date of Birth/Sex: Treating RN: 08-03-1985 (37 y.o. 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 L97.512 Non-pressure chronic ulcer of other part of right foot with fat layer exposed E11.621 Type 2 diabetes mellitus with foot ulcer E11.622 Type 2 diabetes mellitus with other skin ulcer Facility Procedures CPT4 Code Description Modifier Quantity [id number]-(Facility Use Only) HBOT full body chamber, , 4 ICD-10 Diagnosis Description 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 M86.172 Other acute osteomyelitis, left ankle and foot E11.622 Type 2 diabetes mellitus with other skin ulcer Physician Procedures Quantity CPT4 Code Description Modifier [id number] - WC PHYS HYPERBARIC OXYGEN THERAPY 1 ICD-10 Diagnosis Description 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

## 2022-11-10 NOTE — Progress Notes (Addendum)
[place], [name] ([id number]) [id number] Page 1 of 2 Visit Report for 11/10/2022 HBO Details Patient Name: Date of Service: [name] D 11/10/2022 10:00 A M Medical Record Number: [id number] Patient Account Number: [id number] Date of Birth/Sex: Treating RN: [birth year] (37 y.o. Dianna [name] Primary Care : [name] Other Clinician: [name] Referring : Treating /Extender: [name] in Treatment: 159 HBO Treatment Course Details Treatment Course Number: 2 Ordering : Duanne Guess T Treatments Ordered: otal 40 HBO Treatment Start Date: 10/04/2022 HBO Indication: Diabetic Ulcer(s) of the Lower Extremity Wound #3 Left, Lateral, Plantar Foot HBO Treatment Details Treatment Number: 12 Patient Type: Outpatient Chamber Type: Monoplace Chamber Serial #: [id number] Treatment Protocol: 2.0 ATA with 90 minutes oxygen, and no air breaks Treatment Details Compression Rate Down: 1.5 psi / minute De-Compression Rate Up: 2.0 psi / minute Air breaks and breathing Decompress Decompress Compress Tx Pressure Begins Reached periods Begins Ends (leave unused spaces blank) Chamber Pressure (ATA 1 2 ------2 1 ) Clock Time (24 hr) 10:13 10:22 - - - - - - 11:52 12:01 Treatment Length: 108 (minutes) Treatment Segments: 4 Vital Signs Capillary Blood Glucose Reference Range: 80 - 120 mg / dl HBO Diabetic Blood Glucose Intervention Range: <131 mg/dl or >284 mg/dl Time Vitals Blood Respiratory Capillary Blood Glucose Pulse Action Type: Pulse: Temperature: Taken: Pressure: Rate: Glucose (mg/dl): Meter #: Oximetry (%) Taken: Pre 09:50 123/89 96 18 97.5 165 Post 12:05 161/95 71 20 98.2 108 Treatment Response Treatment Toleration: Well Treatment Completion Status: Treatment Completed without Adverse Event Additional Procedure Documentation Tissue Sevierity: Necrosis of muscle Physician HBO  Attestation: I certify that I supervised this HBO treatment in accordance with Medicare guidelines. A trained emergency response team is readily available per Yes hospital policies and procedures. Continue HBOT as ordered. Yes Electronic Signature(s) Signed: 11/11/2022 9:00:36 AM By: Duanne Guess MD FACS Previous Signature: 11/10/2022 12:30:17 PM Version By: [name] EMT Entered By: Duanne Guess on 11/11/2022 09:00:36 [place], [name] ([id number]) [id number] Page 2 of 2 -------------------------------------------------------------------------------- HBO Safety Checklist Details Patient Name: Date of Service: [name] D 11/10/2022 10:00 A M Medical Record Number: [id number] Patient Account Number: [id number] Date of Birth/Sex: Treating RN: [birth year] (37 y.o. Dianna [name] Primary Care : [name] Other Clinician: [name] Referring : Treating /Extender: [name] in Treatment: 159 HBO Safety Checklist Items Safety Checklist Consent Form Signed Patient voided / foley secured and emptied When did you last eato 0630 Last dose of injectable or oral agent Yesterday 60 units Lantus Ostomy pouch emptied and vented if applicable NA All implantable devices assessed, documented and approved NA Intravenous access site secured and place NA Valuables secured Linens and cotton and cotton/polyester blend (less than 51% polyester) Personal oil-based products / skin lotions / body lotions removed Wigs or hairpieces removed NA Smoking or tobacco materials removed Books / newspapers / magazines / loose paper removed Cologne, aftershave, perfume and deodorant removed Jewelry removed (may wrap wedding band) Make-up removed NA Hair care products removed Battery operated devices (external) removed Heating patches and chemical warmers removed Titanium eyewear removed NA Nail polish cured  greater than 10 hours NA Casting material cured greater than 10 hours NA Hearing aids removed NA Loose dentures or partials removed NA Prosthetics have been removed NA Patient demonstrates correct use of air break device (if applicable) Patient concerns have been addressed Patient grounding bracelet on and cord attached to chamber Specifics for Inpatients (complete in addition  to above) Medication sheet sent with patient NA Intravenous medications needed or due during therapy sent with patient NA Drainage tubes (e.g. nasogastric tube or chest tube secured and vented) NA Endotracheal or Tracheotomy tube secured NA Cuff deflated of air and inflated with saline NA Airway suctioned NA Notes The safety checklist was done before the treatment was started. Electronic Signature(s) Signed: 11/10/2022 12:29:05 PM By: [name] EMT Entered By: [name] on 11/10/2022 12:29:05

## 2022-11-10 NOTE — Progress Notes (Signed)
[place], [name] ([id number]) [id number] Page 1 of 2 Visit Report for 11/10/2022 Arrival Information Details Patient Name: Date of Service: [name] D 11/10/2022 10:00 A M Medical Record Number: [id number] Patient Account Number: [id number] Date of Birth/Sex: Treating RN: 1985/06/02 (37 y.o. [name] Primary Care : [name] Other Clinician: [name] Referring : Treating /Extender: [name] in Treatment: 159 Visit Information History Since Last Visit All ordered tests and consults were completed: Yes Patient Arrived: [name] Added or deleted any medications: No Arrival Time: 09:44 Any new allergies or adverse reactions: No Accompanied By: None Had a fall or experienced change in No Transfer Assistance: None activities of daily living that may affect Patient Identification Verified: Yes risk of falls: Secondary Verification Process Completed: Yes Signs or symptoms of abuse/neglect since last visito No Patient Requires Transmission-Based Precautions: No Hospitalized since last visit: No Patient Has Alerts: No Implantable device outside of the clinic excluding No cellular tissue based products placed in the center since last visit: Pain Present Now: No Electronic Signature(s) Signed: 11/10/2022 12:27:04 PM By: [name] EMT Entered By: [name] on 11/10/2022 12:27:03 -------------------------------------------------------------------------------- Encounter Discharge Information Details Patient Name: Date of Service: [name], [name] 11/10/2022 10:00 A M Medical Record Number: [id number] Patient Account Number: [id number] Date of Birth/Sex: Treating RN: 06/28/1985 (37 y.o. [name] Primary Care : [name] Other Clinician: [name] Referring : Treating /Extender: [name] in Treatment:  159 Encounter Discharge Information Items Discharge Condition: Stable Ambulatory Status: Walker Discharge Destination: Home Transportation: Private Auto Accompanied By: None Schedule Follow-up Appointment: Yes Clinical Summary of Care: Electronic Signature(s) Signed: 11/10/2022 12:31:52 PM By: [name] EMT Entered By: [name] on 11/10/2022 12:31:52 [place], [name] ([id number]) [id number] Page 2 of 2 -------------------------------------------------------------------------------- Vitals Details Patient Name: Date of Service: [name] D 11/10/2022 10:00 A M Medical Record Number: [id number] Patient Account Number: [id number] Date of Birth/Sex: Treating RN: 07-31-1985 (37 y.o. [name] Primary Care : [name] Other Clinician: [name] Referring : Treating /Extender: [name] in Treatment: 159 Vital Signs Time Taken: 09:50 Temperature (F): 97.5 Height (in): 77 Pulse (bpm): 96 Weight (lbs): 280 Respiratory Rate (breaths/min): 18 Body Mass Index (BMI): 33.2 Blood Pressure (mmHg): 123/89 Capillary Blood Glucose (mg/dl): 433 Reference Range: 80 - 120 mg / dl Electronic Signature(s) Signed: 11/10/2022 12:27:34 PM By: [name] EMT Entered By: [name] on 11/10/2022 12:27:34

## 2022-11-11 ENCOUNTER — Ambulatory Visit (INDEPENDENT_AMBULATORY_CARE_PROVIDER_SITE_OTHER): Payer: BC Managed Care – PPO | Admitting: Podiatry

## 2022-11-11 ENCOUNTER — Encounter: Payer: Self-pay | Admitting: Podiatry

## 2022-11-11 ENCOUNTER — Encounter (HOSPITAL_BASED_OUTPATIENT_CLINIC_OR_DEPARTMENT_OTHER): Payer: BC Managed Care – PPO | Admitting: General Surgery

## 2022-11-11 DIAGNOSIS — M86272 Subacute osteomyelitis, left ankle and foot: Secondary | ICD-10-CM

## 2022-11-11 DIAGNOSIS — L97312 Non-pressure chronic ulcer of right ankle with fat layer exposed: Secondary | ICD-10-CM

## 2022-11-11 DIAGNOSIS — L97426 Non-pressure chronic ulcer of left heel and midfoot with bone involvement without evidence of necrosis: Secondary | ICD-10-CM

## 2022-11-11 DIAGNOSIS — E11621 Type 2 diabetes mellitus with foot ulcer: Secondary | ICD-10-CM

## 2022-11-11 NOTE — Progress Notes (Signed)
[place], [name] ([id number]) [id number] Page 1 of 3 Visit Report for 11/10/2022 Problem List Details Patient Name: Date of Service: [name] 11/10/2022 10:00 A M Medical Record Number: [id number] Patient Account Number: [id number] Date of Birth/Sex: Treating RN: 02-01-1986 (37 y.o. 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 09/28/2022 No Yes without evidence of necrosis L97.512 Non-pressure chronic ulcer of other part of right foot with fat layer 10/04/2022 No Yes exposed E11.621 Type 2 diabetes mellitus with foot ulcer 10/24/2019 No Yes E11.622 Type 2 diabetes mellitus with other skin ulcer 09/28/2022 No Yes Inactive Problems ICD-10 Code Description Active Date Inactive Date L97.518 Non-pressure chronic ulcer of other part of right foot with other specified 07/14/2020 07/14/2020 severity L97.518 Non-pressure chronic ulcer of other part of right foot with other specified 10/24/2019 10/24/2019 severity 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, right ankle and foot 10/24/2019 10/24/2019 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 08/10/2020 08/10/2020 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, left ankle and foot 10/24/2019 10/24/2019 L97.322 Non-pressure chronic ulcer of left ankle with fat layer exposed 09/29/2021 09/29/2021 [place], [name] ([id number]) [id number] Page 2 of 3 [phone] Non-pressure chronic ulcer of other part of left  foot with fat layer exposed 09/07/2022 09/07/2022 B95.62 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 10/24/2019 10/24/2019 classified elsewhere Resolved Problems ICD-10 Code Description Active Date Resolved Date L89.892 Pressure ulcer of other site, stage 2 07/27/2022 07/27/2022 Electronic Signature(s) Signed: 11/10/2022 12:31:10 PM By: [name] EMT Signed: 11/11/2022 9:04:34 AM By: Duanne Guess MD FACS Entered By: [name] on 11/10/2022 12:31:10 -------------------------------------------------------------------------------- SuperBill Details Patient Name: Date of Service: [name], [name] 11/10/2022 Medical Record Number: [id number] Patient Account Number: [id number] Date of Birth/Sex: Treating RN: 1985-12-18 (37 y.o. 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 without evidence of necrosis L97.512 Non-pressure chronic ulcer of other part of right foot with fat layer exposed E11.621 Type 2 diabetes mellitus with foot ulcer E11.622 Type 2 diabetes mellitus with other skin ulcer Facility Procedures : CPT4 Code: [id number] Description: G0277-(Facility Use Only) HBOT full body chamber, , ICD-10 Diagnosis Description L97.512 Non-pressure chronic ulcer of other part of right foot with fat layer ex L97.524 Non-pressure chronic ulcer of other part of left foot with  necrosis of b M86.172 Other acute osteomyelitis, left ankle and foot E11.622 Type 2 diabetes mellitus with other skin ulcer Modifier: posed one Quantity: 4 Physician Procedures :  CPT4 Code Description Modifier [id number] - WC PHYS HYPERBARIC OXYGEN THERAPY ICD-10 Diagnosis Description L97.512 Non-pressure chronic ulcer of other part of right foot with fat layer exposed L97.524 Non-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

## 2022-11-14 ENCOUNTER — Encounter (HOSPITAL_BASED_OUTPATIENT_CLINIC_OR_DEPARTMENT_OTHER): Payer: BC Managed Care – PPO | Admitting: Internal Medicine

## 2022-11-14 DIAGNOSIS — E11621 Type 2 diabetes mellitus with foot ulcer: Secondary | ICD-10-CM | POA: Diagnosis not present

## 2022-11-14 DIAGNOSIS — L97524 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one: Secondary | ICD-10-CM | POA: Diagnosis not present

## 2022-11-14 DIAGNOSIS — E11622 Type 2 diabetes mellitus with other skin ulcer: Secondary | ICD-10-CM | POA: Diagnosis not present

## 2022-11-14 DIAGNOSIS — M86172 Other acute osteomyelitis, left ankle and foot: Secondary | ICD-10-CM | POA: Diagnosis not present

## 2022-11-14 LAB — GLUCOSE, CAPILLARY
Glucose-Capillary: 131 mg/dL — ABNORMAL HIGH (ref 70–99)
Glucose-Capillary: 168 mg/dL — ABNORMAL HIGH (ref 70–99)
Glucose-Capillary: 120 mg/dL — ABNORMAL HIGH (ref 70–99)

## 2022-11-14 NOTE — Progress Notes (Signed)
[place], [name] ([id number]) [id number] Page 1 of 2 Visit Report for 11/14/2022 Arrival Information Details Patient Name: Date of Service: [name] 11/14/2022 10:00 A M Medical Record Number: [id number] Patient Account Number: [id number] Date of Birth/Sex: Treating RN: Jan 27, 1986 (37 y.o. [name], Bonita Quin Primary Care : [name] Other Clinician: [name] Referring : Treating /Extender: Dione Plover in Treatment: 159 Visit Information History Since Last Visit All ordered tests and consults were completed: Yes Patient Arrived: [name] Added or deleted any medications: No Arrival Time: 09:43 Any new allergies or adverse reactions: No Accompanied By: None Had a fall or experienced change in No Transfer Assistance: None activities of daily living that may affect Patient Identification Verified: Yes risk of falls: Secondary Verification Process Completed: Yes Signs or symptoms of abuse/neglect since last visito No Patient Requires Transmission-Based Precautions: No Hospitalized since last visit: No Patient Has Alerts: No Implantable device outside of the clinic excluding No cellular tissue based products placed in the center since last visit: Pain Present Now: No Electronic Signature(s) Signed: 11/14/2022 12:43:18 PM By: [name] EMT Entered By: [name] on 11/14/2022 12:43:18 -------------------------------------------------------------------------------- Encounter Discharge Information Details Patient Name: Date of Service: [name], [name] 11/14/2022 10:00 A M Medical Record Number: [id number] Patient Account Number: [id number] Date of Birth/Sex: Treating RN: 08/03/1985 (37 y.o. [name] Primary Care : [name] Other Clinician: [name] Referring : Treating /Extender: Dione Plover in Treatment:  159 Encounter Discharge Information Items Discharge Condition: Stable Ambulatory Status: Walker Discharge Destination: Home Transportation: Private Auto Accompanied By: None Schedule Follow-up Appointment: Yes Clinical Summary of Care: Electronic Signature(s) Signed: 11/14/2022 1:51:10 PM By: [name] EMT Entered By: [name] on 11/14/2022 13:51:10 [place], [name] ([id number]) [id number] Page 2 of 2 -------------------------------------------------------------------------------- Vitals Details Patient Name: Date of Service: [name] 11/14/2022 10:00 A M Medical Record Number: [id number] Patient Account Number: [id number] Date of Birth/Sex: Treating RN: [birth year] (37 y.o. [name] Primary Care : [name] Other Clinician: [name] Referring : Treating /Extender: Dione Plover in Treatment: 159 Vital Signs Time Taken: 09:49 Temperature (F): 97.3 Height (in): 77 Pulse (bpm): 82 Weight (lbs): 280 Respiratory Rate (breaths/min): 18 Body Mass Index (BMI): 33.2 Blood Pressure (mmHg): 130/62 Capillary Blood Glucose (mg/dl): 283 Reference Range: 80 - 120 mg / dl Electronic Signature(s) Signed: 11/14/2022 12:43:48 PM By: [name] EMT Entered By: [name] on 11/14/2022 12:43:48

## 2022-11-14 NOTE — Progress Notes (Signed)
[place], [name] ([id number]) [id number] Page 1 of 3 Visit Report for 11/14/2022 Problem List Details Patient Name: Date of Service: [name] D 11/14/2022 10:00 A M Medical Record Number: [id number] Patient Account Number: [id number] Date of Birth/Sex: Treating RN: [birth year] (37 y.o. 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 09/28/2022 No Yes without evidence of necrosis L97.512 Non-pressure chronic ulcer of other part of right foot with fat layer 10/04/2022 No Yes exposed E11.621 Type 2 diabetes mellitus with foot ulcer 10/24/2019 No Yes E11.622 Type 2 diabetes mellitus with other skin ulcer 09/28/2022 No Yes Inactive Problems ICD-10 Code Description Active Date Inactive Date L97.518 Non-pressure chronic ulcer of other part of right foot with other specified 07/14/2020 07/14/2020 severity L97.518 Non-pressure chronic ulcer of other part of right foot with other specified 10/24/2019 10/24/2019 severity 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, right ankle and foot 10/24/2019 10/24/2019 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 08/10/2020 08/10/2020 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, left ankle and foot 10/24/2019 10/24/2019 L97.322 Non-pressure chronic ulcer of left ankle with fat layer exposed 09/29/2021 09/29/2021 [place], [name] ([id number]) [id number] Page 2 of 3 [phone] Non-pressure chronic ulcer of other part of left  foot with fat layer exposed 09/07/2022 09/07/2022 B95.62 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 10/24/2019 10/24/2019 classified elsewhere Resolved Problems ICD-10 Code Description Active Date Resolved Date L89.892 Pressure ulcer of other site, stage 2 07/27/2022 07/27/2022 Electronic Signature(s) Signed: 11/14/2022 1:49:50 PM By: [name] EMT Signed: 11/14/2022 4:52:09 PM By: Geralyn Corwin DO Entered By: [name] on 11/14/2022 13:49:50 -------------------------------------------------------------------------------- SuperBill Details Patient Name: Date of Service: [name], [name] 11/14/2022 Medical Record Number: [id number] Patient Account Number: [id number] Date of Birth/Sex: Treating RN: 1985-12-16 (37 y.o. 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 without evidence of necrosis L97.512 Non-pressure chronic ulcer of other part of right foot with fat layer exposed E11.621 Type 2 diabetes mellitus with foot ulcer E11.622 Type 2 diabetes mellitus with other skin ulcer Facility Procedures : CPT4 Code: 01027253 Description: G0277-(Facility Use Only) HBOT full body chamber, , ICD-10 Diagnosis Description 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 M86.172 Other acute  osteomyelitis, left ankle and foot E11.622 Type 2 diabetes mellitus with other skin ulcer Modifier: one Quantity: 4 Physician Procedures : CPT4 Code Description Modifier [id number]  99183 - WC PHYS HYPERBARIC OXYGEN THERAPY ICD-10 Diagnosis Description 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

## 2022-11-14 NOTE — Progress Notes (Addendum)
[place], [name] ([id number]) [id number] Page 1 of 2 Visit Report for 11/14/2022 HBO Details Patient Name: Date of Service: [name] 11/14/2022 10:00 A M Medical Record Number: [id number] Patient Account Number: [id number] Date of Birth/Sex: Treating RN: 1985/12/07 (37 y.o. [name] Primary Care : [name] Other Clinician: [name] Referring : Treating /Extender: Dione Plover in Treatment: 159 HBO Treatment Course Details Treatment Course Number: 2 Ordering : Duanne Guess T Treatments Ordered: otal 40 HBO Treatment Start Date: 10/04/2022 HBO Indication: Diabetic Ulcer(s) of the Lower Extremity Wound #3 Left, Lateral, Plantar Foot HBO Treatment Details Treatment Number: 13 Patient Type: Outpatient Chamber Type: Monoplace Chamber Serial #: [id number] Treatment Protocol: 2.0 ATA with 90 minutes oxygen, and no air breaks Treatment Details Compression Rate Down: 2.0 psi / minute De-Compression Rate Up: 2.0 psi / minute Air breaks and breathing Decompress Decompress Compress Tx Pressure Begins Reached periods Begins Ends (leave unused spaces blank) Chamber Pressure (ATA 1 2 ------2 1 ) Clock Time (24 hr) 11:04 11:14 - - - - - - 11:45 12:51 Treatment Length: 107 (minutes) Treatment Segments: 4 Vital Signs Capillary Blood Glucose Reference Range: 80 - 120 mg / dl HBO Diabetic Blood Glucose Intervention Range: <131 mg/dl or >295 mg/dl Time Vitals Blood Respiratory Capillary Blood Glucose Pulse Action Type: Pulse: Temperature: Taken: Pressure: Rate: Glucose (mg/dl): Meter #: Oximetry (%) Taken: Pre 09:49 130/62 82 18 97.3 131 Post 12:58 166/89 77 18 97.7 120 Pre 10:54 168 Treatment Response Treatment Toleration: Well Treatment Completion Status: Treatment Completed without Adverse Event Treatment Notes On arrival the patient blood sugar was 131. He stated  that he had just ate breakfast about 45 min ago. The blood sugar was rechecked before starting treatment, results were 168 at 1054. Treatment was started. Additional Procedure Documentation Tissue Sevierity: Necrosis of bone Physician HBO Attestation: I certify that I supervised this HBO treatment in accordance with Medicare guidelines. A trained emergency response team is readily available per Yes hospital policies and procedures. Continue HBOT as ordered. Yes Electronic Signature(s) [place], [name] ([id number]) [id number] Page 2 of 2 Signed: 11/14/2022 4:52:09 PM By: Geralyn Corwin DO Previous Signature: 11/14/2022 1:49:05 PM Version By: [name] EMT Entered By: Geralyn Corwin on 11/14/2022 16:50:40 -------------------------------------------------------------------------------- HBO Safety Checklist Details Patient Name: Date of Service: [name], [name] 11/14/2022 10:00 A M Medical Record Number: [id number] Patient Account Number: [id number] Date of Birth/Sex: Treating RN: 05-04-1985 (37 y.o. [name] Primary Care : [name] Other Clinician: [name] Referring : Treating /Extender: Dione Plover in Treatment: 159 HBO Safety Checklist Items Safety Checklist Consent Form Signed Patient voided / foley secured and emptied When did you last eato 0830 Last dose of injectable or oral agent Yesterday 1600 60 units Lantus Ostomy pouch emptied and vented if applicable NA All implantable devices assessed, documented and approved NA Intravenous access site secured and place NA Valuables secured Linens and cotton and cotton/polyester blend (less than 51% polyester) Personal oil-based products / skin lotions / body lotions removed Wigs or hairpieces removed NA Smoking or tobacco materials removed Books / newspapers / magazines / loose paper removed Cologne, aftershave, perfume and  deodorant removed Jewelry removed (may wrap wedding band) Make-up removed NA Hair care products removed Battery operated devices (external) removed Heating patches and chemical warmers removed Titanium eyewear removed NA Nail polish cured greater than 10 hours NA Casting material cured greater than 10 hours NA Hearing aids removed NA  Loose dentures or partials removed NA Prosthetics have been removed NA Patient demonstrates correct use of air break device (if applicable) Patient concerns have been addressed Patient grounding bracelet on and cord attached to chamber Specifics for Inpatients (complete in addition to above) Medication sheet sent with patient NA Intravenous medications needed or due during therapy sent with patient NA Drainage tubes (e.g. nasogastric tube or chest tube secured and vented) NA Endotracheal or Tracheotomy tube secured NA Cuff deflated of air and inflated with saline NA Airway suctioned NA Notes The safety checklist was done before the treatment was started. Electronic Signature(s) Signed: 11/14/2022 1:43:35 PM By: [name] EMT Entered By: [name] on 11/14/2022 13:43:35

## 2022-11-15 ENCOUNTER — Encounter (HOSPITAL_BASED_OUTPATIENT_CLINIC_OR_DEPARTMENT_OTHER): Payer: BC Managed Care – PPO | Admitting: Internal Medicine

## 2022-11-15 DIAGNOSIS — E11622 Type 2 diabetes mellitus with other skin ulcer: Secondary | ICD-10-CM | POA: Diagnosis not present

## 2022-11-15 LAB — GLUCOSE, CAPILLARY
Glucose-Capillary: 87 mg/dL (ref 70–99)
Glucose-Capillary: 90 mg/dL (ref 70–99)
Glucose-Capillary: 102 mg/dL — ABNORMAL HIGH (ref 70–99)

## 2022-11-16 ENCOUNTER — Encounter (HOSPITAL_BASED_OUTPATIENT_CLINIC_OR_DEPARTMENT_OTHER): Payer: BC Managed Care – PPO | Admitting: General Surgery

## 2022-11-16 DIAGNOSIS — E11622 Type 2 diabetes mellitus with other skin ulcer: Secondary | ICD-10-CM | POA: Diagnosis not present

## 2022-11-16 LAB — GLUCOSE, CAPILLARY
Glucose-Capillary: 106 mg/dL — ABNORMAL HIGH (ref 70–99)
Glucose-Capillary: 137 mg/dL — ABNORMAL HIGH (ref 70–99)
Glucose-Capillary: 111 mg/dL — ABNORMAL HIGH (ref 70–99)

## 2022-11-17 ENCOUNTER — Encounter: Payer: BC Managed Care – PPO | Admitting: Podiatry

## 2022-11-17 ENCOUNTER — Encounter (HOSPITAL_BASED_OUTPATIENT_CLINIC_OR_DEPARTMENT_OTHER): Payer: BC Managed Care – PPO | Admitting: General Surgery

## 2022-11-17 DIAGNOSIS — E11622 Type 2 diabetes mellitus with other skin ulcer: Secondary | ICD-10-CM | POA: Diagnosis not present

## 2022-11-17 LAB — GLUCOSE, CAPILLARY
Glucose-Capillary: 109 mg/dL — ABNORMAL HIGH (ref 70–99)
Glucose-Capillary: 187 mg/dL — ABNORMAL HIGH (ref 70–99)
Glucose-Capillary: 174 mg/dL — ABNORMAL HIGH (ref 70–99)

## 2022-11-17 NOTE — Progress Notes (Signed)
[place], [name] ([id number]) [id number] Page 1 of 1 Visit Report for 11/15/2022 SuperBill Details Patient Name: Date of Service: [name] 11/15/2022 Medical Record Number: [id number] Patient Account Number: [id number] Date of Birth/Sex: Treating RN: 1985-09-13 (37 y.o. [name] Primary Care Provider: [name] Other Clinician: Referring Provider: Treating Provider/Extender: Dione Plover in Treatment: 159 Diagnosis Coding ICD-10 Codes Code Description [phone] 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one M86.172 Other acute osteomyelitis, left ankle and foot Non-pressure chronic ulcer of right ankle with muscle involvement without evidence of L97.315 necrosis L97.512 Non-pressure chronic ulcer of other part of right foot with fat layer exposed E11.621 Type 2 diabetes mellitus with foot ulcer E11.622 Type 2 diabetes mellitus with other skin ulcer Facility Procedures CPT4 Code Description Modifier Quantity [id number] [phone] - WOUND CARE VISIT-LEV 2 EST PT 1 Electronic Signature(s) Signed: 11/15/2022 4:37:08 PM By: Geralyn Corwin DO Signed: 11/16/2022 6:05:41 PM By: Shawn Stall RN, BSN Entered By: Shawn Stall on 11/15/2022 12:19:41

## 2022-11-17 NOTE — Progress Notes (Signed)
[place], [name] ([id number]) [id number] Page 1 of 1 Visit Report for 11/17/2022 SuperBill Details Patient Name: Date of Service: [name] 11/17/2022 Medical Record Number: [id number] Patient Account Number: [id number] Date of Birth/Sex: Treating RN: [birth year] (37 y.o. 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 without evidence of necrosis L97.512 Non-pressure chronic ulcer of other part of right foot with fat layer exposed E11.621 Type 2 diabetes mellitus with foot ulcer E11.622 Type 2 diabetes mellitus with other skin ulcer Facility Procedures CPT4 Code Description Modifier Quantity [id number] G0277-(Facility Use Only) HBOT full body chamber, , 4 Physician Procedures Quantity CPT4 Code Description Modifier 2440102 99183 - WC PHYS HYPERBARIC OXYGEN THERAPY 1 ICD-10 Diagnosis Description 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ne M86.172 Other acute osteomyelitis, left ankle and foot E11.621 Type 2 diabetes mellitus with foot ulcer Electronic Signature(s) Signed: 11/17/2022 3:14:59 PM By: Fonnie Mu RN Signed: 11/17/2022 3:51:54 PM By: Duanne Guess MD FACS Entered By: Fonnie Mu on 11/17/2022 15:14:53

## 2022-11-17 NOTE — Progress Notes (Addendum)
[place], [name] ([id number]) [id number] Page 1 of 2 Visit Report for 11/17/2022 HBO Details Patient Name: Date of Service: [name] D 11/17/2022 10:00 A M Medical Record Number: [id number] Patient Account Number: [id number] Date of Birth/Sex: Treating RN: 12-Nov-1985 (38 y.o. Charlean Merl, [name] Primary Care : [name] Other Clinician: Referring : Treating /Extender: [name] in Treatment: 160 HBO Treatment Course Details Treatment Course Number: 2 Ordering : Duanne Guess T Treatments Ordered: otal 40 HBO Treatment Start Date: 10/04/2022 HBO Indication: Diabetic Ulcer(s) of the Lower Extremity Wound #3 Left, Lateral, Plantar Foot HBO Treatment Details Treatment Number: 15 Patient Type: Outpatient Chamber Type: Monoplace Chamber Serial #: [id number] Treatment Protocol: 2.0 ATA with 90 minutes oxygen, and no air breaks Treatment Details Compression Rate Down: 1.5 psi / minute De-Compression Rate Up: 1.5 psi / minute Air breaks and breathing Decompress Decompress Compress Tx Pressure Begins Reached periods Begins Ends (leave unused spaces blank) Chamber Pressure (ATA 1 2 ------2 1 ) Clock Time (24 hr) 11:03 11:18 - - - - - - 12:48 12:58 Treatment Length: 115 (minutes) Treatment Segments: 4 Vital Signs Capillary Blood Glucose Reference Range: 80 - 120 mg / dl HBO Diabetic Blood Glucose Intervention Range: <131 mg/dl or >010 mg/dl Type: Time Vitals Blood Pulse: Respiratory Temperature: Capillary Blood Glucose Pulse Action Taken: Pressure: Rate: Glucose (mg/dl): Meter #: Oximetry (%) Taken: Pre 11:00 115/78 109 17 98.1 109 md made aware; pt. drank 8 oz glucernaand8oz juice Post 13:04 147/88 78 18 98.2 174 Treatment Response Treatment Toleration: Well Treatment Completion Status: Treatment Completed without Adverse Event Additional Procedure Documentation Tissue Sevierity:  Necrosis of bone  Notes rechecked CBG 30 mins after pt. drank orange juice and glucerna; CBG 187 @ 10:45 MD made aware Physician HBO Attestation: I certify that I supervised this HBO treatment in accordance with Medicare guidelines. A trained emergency response team is readily available per Yes hospital policies and procedures. Continue HBOT as ordered. Yes Electronic Signature(s) Signed: 11/17/2022 4:16:27 PM By: Duanne Guess MD FACS Previous Signature: 11/17/2022 3:14:22 PM Version By: Fonnie Mu RN Previous Signature: 11/17/2022 11:20:24 AM Version By: Fonnie Mu RN [place], [name] ([id number]) [id number] Page 2 of 2 Previous Signature: 11/17/2022 11:20:24 AM Version By: Fonnie Mu RN Previous Signature: 11/17/2022 11:10:22 AM Version By: Fonnie Mu RN Entered By: Duanne Guess on 11/17/2022 16:16:27 -------------------------------------------------------------------------------- HBO Safety Checklist Details Patient Name: Date of Service: [name], [name] 11/17/2022 10:00 A M Medical Record Number: [id number] Patient Account Number: [id number] Date of Birth/Sex: Treating RN: [birth year] (37 y.o. Charlean Merl, [name] Primary Care : [name] Other Clinician: Referring : Treating /Extender: [name] in Treatment: 160 HBO Safety Checklist Items Safety Checklist Consent Form Signed Patient voided / foley secured and emptied 0715 PROTEIN SNACK RICE VEGETABLE When did you last eato STRAWS Last dose of injectable or oral agent Ostomy pouch emptied and vented if applicable NA All implantable devices assessed, documented and approved NA Intravenous access site secured and place NA Valuables secured Linens and cotton and cotton/polyester blend (less than 51% polyester) Personal oil-based products / skin lotions / body lotions removed NA Wigs or hairpieces  removed NA Smoking or tobacco materials removed NA Books / newspapers / magazines / loose paper removed NA Cologne, aftershave, perfume and deodorant removed NA Jewelry removed (may wrap wedding band) NA Make-up removed NA Hair care products removed NA Battery operated devices (external) removed NA Heating patches and chemical warmers removed NA Titanium  eyewear removed NA Nail polish cured greater than 10 hours NA Casting material cured greater than 10 hours NA Hearing aids removed NA Loose dentures or partials removed NA Prosthetics have been removed NA Patient demonstrates correct use of air break device (if applicable) Patient concerns have been addressed Patient grounding bracelet on and cord attached to chamber Specifics for Inpatients (complete in addition to above) Medication sheet sent with patient Intravenous medications needed or due during therapy sent with patient Drainage tubes (e.g. nasogastric tube or chest tube secured and vented) Endotracheal or Tracheotomy tube secured Cuff deflated of air and inflated with saline Airway suctioned Electronic Signature(s) Signed: 11/17/2022 11:08:17 AM By: Fonnie Mu RN Entered By: Fonnie Mu on 11/17/2022 11:08:17

## 2022-11-17 NOTE — Progress Notes (Signed)
[place], [name] ([id number]) [id number] Page 1 of 3 Visit Report for 11/16/2022 Problem List Details Patient Name: Date of Service: [name] 11/16/2022 10:00 A M Medical Record Number: [id number] Patient Account Number: [id number] Date of Birth/Sex: Treating RN: Jun 09, 1985 (37 y.o. 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 09/28/2022 No Yes without evidence of necrosis L97.512 Non-pressure chronic ulcer of other part of right foot with fat layer 10/04/2022 No Yes exposed E11.621 Type 2 diabetes mellitus with foot ulcer 10/24/2019 No Yes E11.622 Type 2 diabetes mellitus with other skin ulcer 09/28/2022 No Yes Inactive Problems ICD-10 Code Description Active Date Inactive Date L97.518 Non-pressure chronic ulcer of other part of right foot with other specified 07/14/2020 07/14/2020 severity L97.518 Non-pressure chronic ulcer of other part of right foot with other specified 10/24/2019 10/24/2019 severity 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, right ankle and foot 10/24/2019 10/24/2019 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 08/10/2020 08/10/2020 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, left ankle and foot 10/24/2019 10/24/2019 L97.322 Non-pressure chronic ulcer of left ankle with fat layer exposed 09/29/2021 09/29/2021 [place], [name] ([id number]) [id number] Page 2 of 3 [phone] Non-pressure chronic ulcer of other part of left foot with fat  layer exposed 09/07/2022 09/07/2022 B95.62 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 10/24/2019 10/24/2019 classified elsewhere Resolved Problems ICD-10 Code Description Active Date Resolved Date L89.892 Pressure ulcer of other site, stage 2 07/27/2022 07/27/2022 Electronic Signature(s) Signed: 11/16/2022 2:33:10 PM By: Duanne Guess MD FACS Signed: 11/16/2022 5:11:24 PM By: Zenaida Deed RN, BSN Entered By: Zenaida Deed on 11/16/2022 13:00:27 -------------------------------------------------------------------------------- SuperBill Details Patient Name: Date of Service: [name], [name] 11/16/2022 Medical Record Number: [id number] Patient Account Number: [id number] Date of Birth/Sex: Treating RN: [birth year] (37 y.o. 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 without evidence of necrosis L97.512 Non-pressure chronic ulcer of other part of right foot with fat layer exposed E11.621 Type 2 diabetes mellitus with foot ulcer E11.622 Type 2 diabetes mellitus with other skin ulcer Facility Procedures : CPT4 Code: 41660630 Description: G0277-(Facility Use Only) HBOT full body chamber, , ICD-10 Diagnosis Description 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 M86.172 Other acute  osteomyelitis, left ankle and foot Modifier: one Quantity: 4 Physician Procedures : CPT4 Code Description Modifier [id number] - WC PHYS HYPERBARIC OXYGEN THERAPY ICD-10 Diagnosis Description  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

## 2022-11-17 NOTE — Progress Notes (Signed)
[place], [name] ([id number]) [id number] Page 1 of 2 Visit Report for 11/16/2022 Arrival Information Details Patient Name: Date of Service: [name] D 11/16/2022 10:00 A M Medical Record Number: [id number] Patient Account Number: [id number] Date of Birth/Sex: Treating RN: [birth year] (37 y.o. [name] Primary Care : [name] Other Clinician: Referring : Treating /Extender: [name] in Treatment: 159 Visit Information History Since Last Visit Pain Present Now: No Patient Arrived: [place] Arrival Time: 09:50 Accompanied By: self Transfer Assistance: None Patient Identification Verified: Yes Secondary Verification Process Completed: Yes Patient Requires Transmission-Based Precautions: No Patient Has Alerts: No Electronic Signature(s) Signed: 11/16/2022 5:11:24 PM By: Zenaida Deed RN, BSN Entered By: Zenaida Deed on 11/16/2022 11:30:09 -------------------------------------------------------------------------------- Encounter Discharge Information Details Patient Name: Date of Service: [name], [name] 11/16/2022 10:00 A M Medical Record Number: [id number] Patient Account Number: [id number] Date of Birth/Sex: Treating RN: 09-Sep-1985 (37 y.o. [name] Primary Care : [name] Other Clinician: Referring : Treating /Extender: [name] in Treatment: 159 Encounter Discharge Information Items Discharge Condition: Stable Ambulatory Status: Walker Discharge Destination: Home Transportation: Private Auto Accompanied By: self Schedule Follow-up Appointment: Yes Clinical Summary of Care: Patient Declined Electronic Signature(s) Signed: 11/16/2022 5:11:24 PM By: Zenaida Deed RN, BSN Entered By: Zenaida Deed on 11/16/2022 13:01:29 Patient/Caregiver Education  Details -------------------------------------------------------------------------------- [place], [name] ([id number]) [id number] Page 2 of 2 Patient Name: Date of Service: [name] Height 8/14/2024andnbsp10:00 A M Medical Record Number: [id number] Patient Account Number: [id number] Date of Birth/Gender: Treating RN: 1985/12/24 (37 y.o. [name] Primary Care Physician: [name] Other Clinician: Referring Physician: Treating Physician/Extender: [name] in Treatment: 159 Education Assessment Education Provided To: Patient Education Topics Provided Elevated Blood Sugar/ Impact on Healing: Methods: Explain/Verbal Responses: Reinforcements needed, State content correctly Hyperbaric Oxygenation: Methods: Explain/Verbal Responses: Reinforcements needed, State content correctly Electronic Signature(s) Signed: 11/16/2022 5:11:24 PM By: Zenaida Deed RN, BSN Entered By: Zenaida Deed on 11/16/2022 13:01:08 -------------------------------------------------------------------------------- Vitals Details Patient Name: Date of Service: [name], [name] 11/16/2022 10:00 A M Medical Record Number: [id number] Patient Account Number: [id number] Date of Birth/Sex: Treating RN: Jan 19, 1986 (36 y.o. [name] Primary Care : [name] Other Clinician: Referring : Treating /Extender: [name] in Treatment: 159 Vital Signs Time Taken: 10:48 Temperature (F): 97.3 Height (in): 77 Pulse (bpm): 97 Weight (lbs): 280 Respiratory Rate (breaths/min): 18 Body Mass Index (BMI): 33.2 Blood Pressure (mmHg): 127/87 Capillary Blood Glucose (mg/dl): 536 Reference Range: 80 - 120 mg / dl Notes given Glucerna and 2 cups apple juice. 1029 Repeat blood sugar 137 Electronic Signature(s) Signed: 11/16/2022 5:11:24 PM By: Zenaida Deed RN, BSN Entered By: Zenaida Deed  on 11/16/2022 11:33:13

## 2022-11-17 NOTE — Progress Notes (Addendum)
[place], [name] ([id number]) [id number] Page 1 of 2 Visit Report for 11/17/2022 Arrival Information Details Patient Name: Date of Service: [name] D 11/17/2022 10:00 A M Medical Record Number: [id number] Patient Account Number: [id number] Date of Birth/Sex: Treating RN: 11-Feb-1986 (37 y.o. Charlean Merl, [name] Primary Care : [name] Other Clinician: Referring : Treating /Extender: [name] in Treatment: 160 Visit Information History Since Last Visit Added or deleted any medications: No Patient Arrived: [name] Any new allergies or adverse reactions: No Arrival Time: 10:00 Had a fall or experienced change in No Accompanied By: self activities of daily living that may affect Transfer Assistance: None risk of falls: Patient Identification Verified: Yes Signs or symptoms of abuse/neglect since last visito No Secondary Verification Process Completed: Yes Hospitalized since last visit: No Patient Requires Transmission-Based Precautions: No Implantable device outside of the clinic excluding No Patient Has Alerts: No cellular tissue based products placed in the center since last visit: Has Dressing in Place as Prescribed: Yes Pain Present Now: No Electronic Signature(s) Signed: 11/17/2022 3:15:48 PM By: Fonnie Mu RN Previous Signature: 11/17/2022 10:34:00 AM Version By: Fonnie Mu RN Entered By: Fonnie Mu on 11/17/2022 15:15:48 -------------------------------------------------------------------------------- Encounter Discharge Information Details Patient Name: Date of Service: [name], [name] 11/17/2022 10:00 A M Medical Record Number: [id number] Patient Account Number: [id number] Date of Birth/Sex: Treating RN: 09-08-1985 (37 y.o. Charlean Merl, [name] Primary Care : [name] Other Clinician: Referring : Treating /Extender: [name] in Treatment: 160 Encounter Discharge Information Items Discharge Condition: Stable Ambulatory Status: Walker Discharge Destination: Home Transportation: Private Auto Accompanied By: self Schedule Follow-up Appointment: Yes Clinical Summary of Care: Patient Declined Electronic Signature(s) Signed: 11/17/2022 3:15:33 PM By: Fonnie Mu RN Entered By: Fonnie Mu on 11/17/2022 15:15:33 [place], [name] ([id number]) [id number] Page 2 of 2 -------------------------------------------------------------------------------- Patient/Caregiver Education Details Patient Name: Date of Service: [name] 8/15/2024andnbsp10:00 A M Medical Record Number: [id number] Patient Account Number: [id number] Date of Birth/Gender: Treating RN: [birth year] (37 y.o. Lucious [place]ves Primary Care Physician: [name] Other Clinician: Referring Physician: Treating Physician/Extender: [name] in Treatment: 160 Education Assessment Education Provided To: Patient Education Topics Provided Hyperbaric Oxygenation: Methods: Explain/Verbal Responses: State content correctly Electronic Signature(s) Signed: 11/17/2022 3:18:41 PM By: Fonnie Mu RN Entered By: Fonnie Mu on 11/17/2022 15:15:12 -------------------------------------------------------------------------------- Vitals Details Patient Name: Date of Service: [name], [name] 11/17/2022 10:00 A M Medical Record Number: [id number] Patient Account Number: [id number] Date of Birth/Sex: Treating RN: [birth year] (37 y.o. Charlean Merl, [name] Primary Care : [name] Other Clinician: Referring : Treating /Extender: [name] in Treatment: 160 Vital Signs Time Taken: 11:00 Temperature (F): 98.1 Height (in): 77 Pulse (bpm): 109 Weight (lbs): 280 Respiratory Rate  (breaths/min): 17 Body Mass Index (BMI): 33.2 Blood Pressure (mmHg): 115/78 Capillary Blood Glucose (mg/dl): 073 Reference Range: 80 - 120 mg / dl Notes 1st CBG 710-GYIRS 8 OZ GLUCERNA AND 8 OZ ORANGE JUICE MD MADE AWARE. RECHECKED 30 MINUTES LATER AND CBG 187. MD MADE AWARE Electronic Signature(s) Signed: 11/17/2022 11:07:17 AM By: Fonnie Mu RN Entered By: Fonnie Mu on 11/17/2022 11:07:17

## 2022-11-17 NOTE — Progress Notes (Signed)
[name] ([id number]) [id number] Page 1 of 4 Visit Report for 11/15/2022 Arrival Information Details Patient Name: Date of Service: [name] D 11/15/2022 10:00 A M Medical Record Number: [id number] Patient Account Number: [id number] Date of Birth/Sex: Treating RN: 31-Aug-1985 (37 y.o. [name] Primary Care : [name] Other Clinician: Referring : Treating /Extender: Dione Plover in Treatment: 159 Visit Information History Since Last Visit Added or deleted any medications: No Patient Arrived: Ambulatory Any new allergies or adverse reactions: No Arrival Time: 10:30 Had a fall or experienced change in No Accompanied By: self activities of daily living that may affect Transfer Assistance: None risk of falls: Patient Identification Verified: Yes Signs or symptoms of abuse/neglect since No Secondary Verification Process Completed: Yes last visito Patient Requires Transmission-Based Precautions: No Hospitalized since last visit: No Patient Has Alerts: No Implantable device outside of the clinic No excluding cellular tissue based products placed in the center since last visit: Has Dressing in Place as Prescribed: Yes Has Footwear/Offloading in Place as Yes Prescribed: Left: Removable Cast Walker/Walking Boot Right: Surgical Shoe with Pressure Relief Insole Pain Present Now: No Electronic Signature(s) Signed: 11/16/2022 6:05:41 PM By: Shawn Stall RN, BSN Entered By: Shawn Stall on 11/15/2022 11:23:47 -------------------------------------------------------------------------------- Clinic Level of Care Assessment Details Patient Name: Date of Service: [name] D 11/15/2022 10:00 A M Medical Record Number: [id number] Patient Account Number: [id number] Date of Birth/Sex: Treating RN: 05-25-1985 (37 y.o. [name], [name] Primary Care : [name] Other  Clinician: Referring : Treating /Extender: Dione Plover in Treatment: 159 Clinic Level of Care Assessment Items TOOL 4 Quantity Score X- 1 0 Use when only an EandM is performed on FOLLOW-UP visit ASSESSMENTS - Nursing Assessment / Reassessment X- 1 10 Reassessment of Co-morbidities (includes updates in patient status) X- 1 5 Reassessment of Adherence to Treatment Plan ASSESSMENTS - Wound and Skin A ssessment / Reassessment []  - 0 Simple Wound Assessment / Reassessment - one wound []  - 0 Complex Wound Assessment / Reassessment - multiple wounds []  - 0 Dermatologic / Skin Assessment (not related to wound area) [name] ([id number]) [id number] Page 2 of 4 ASSESSMENTS - Focused Assessment []  - 0 Circumferential Edema Measurements - multi extremities []  - 0 Nutritional Assessment / Counseling / Intervention []  - 0 Lower Extremity Assessment (monofilament, tuning fork, pulses) []  - 0 Peripheral Arterial Disease Assessment (using hand held doppler) ASSESSMENTS - Ostomy and/or Continence Assessment and Care []  - 0 Incontinence Assessment and Management []  - 0 Ostomy Care Assessment and Management (repouching, etc.) PROCESS - Coordination of Care X - Simple Patient / Family Education for ongoing care 1 15 []  - 0 Complex (extensive) Patient / Family Education for ongoing care X- 1 10 Staff obtains Chiropractor, Records, T Results / Process Orders est []  - 0 Staff telephones HHA, Nursing Homes / Clarify orders / etc []  - 0 Routine Transfer to another Facility (non-emergent condition) []  - 0 Routine Hospital Admission (non-emergent condition) []  - 0 New Admissions / Manufacturing engineer / Ordering NPWT Apligraf, etc. , []  - 0 Emergency Hospital Admission (emergent condition) X- 1 10 Simple Discharge Coordination []  - 0 Complex (extensive) Discharge Coordination PROCESS - Special Needs []  -  0 Pediatric / Minor Patient Management []  - 0 Isolation Patient Management []  - 0 Hearing / Language / Visual special needs []  - 0 Assessment of Community assistance (transportation, D/C planning, etc.) []  - 0 Additional assistance / Altered mentation []  -  0 Support Surface(s) Assessment (bed, cushion, seat, etc.) INTERVENTIONS - Wound Cleansing / Measurement []  - 0 Simple Wound Cleansing - one wound []  - 0 Complex Wound Cleansing - multiple wounds []  - 0 Wound Imaging (photographs - any number of wounds) []  - 0 Wound Tracing (instead of photographs) []  - 0 Simple Wound Measurement - one wound []  - 0 Complex Wound Measurement - multiple wounds INTERVENTIONS - Wound Dressings []  - 0 Small Wound Dressing one or multiple wounds []  - 0 Medium Wound Dressing one or multiple wounds []  - 0 Large Wound Dressing one or multiple wounds []  - 0 Application of Medications - topical []  - 0 Application of Medications - injection INTERVENTIONS - Miscellaneous []  - 0 External ear exam []  - 0 Specimen Collection (cultures, biopsies, blood, body fluids, etc.) []  - 0 Specimen(s) / Culture(s) sent or taken to Lab for analysis []  - 0 Patient Transfer (multiple staff / Teddy Spike / Similar devices) [place], [name] ([id number]) [id number] Page 3 of 4 []  - 0 Simple Staple / Suture removal (25 or less) []  - 0 Complex Staple / Suture removal (26 or more) X- 1 10 Hypo / Hyperglycemic Management (close monitor of Blood Glucose) []  - 0 Ankle / Brachial Index (ABI) - do not check if billed separately X- 1 5 Vital Signs Has the patient been seen at the hospital within the last three years: Yes Total Score: 65 Level Of Care: New/Established - Level 2 Electronic Signature(s) Signed: 11/16/2022 6:05:41 PM By: Shawn Stall RN, BSN Entered By: Shawn Stall on 11/15/2022  12:19:33 -------------------------------------------------------------------------------- Encounter Discharge Information Details Patient Name: Date of Service: [name], [name] 11/15/2022 10:00 A M Medical Record Number: [id number] Patient Account Number: [id number] Date of Birth/Sex: Treating RN: [birth year] (37 y.o. [name] Primary Care : [name] Other Clinician: Referring : Treating /Extender: Dione Plover in Treatment: 159 Encounter Discharge Information Items Discharge Condition: Stable Ambulatory Status: Walker Discharge Destination: Home Transportation: Private Auto Accompanied By: self Schedule Follow-up Appointment: Yes Clinical Summary of Care: Notes Reminded patient to eat a good breakfast. Patient in agreement. Electronic Signature(s) Signed: 11/16/2022 6:05:41 PM By: Shawn Stall RN, BSN Entered By: Shawn Stall on 11/15/2022 12:18:25 -------------------------------------------------------------------------------- Vitals Details Patient Name: Date of Service: [name], [name] 11/15/2022 10:00 A M Medical Record Number: [id number] Patient Account Number: [id number] Date of Birth/Sex: Treating RN: Dec 08, 1985 (37 y.o. [name] Primary Care : [name] Other Clinician: Referring : Treating /Extender: Dione Plover in Treatment: 159 Vital Signs Time Taken: 10:57 Temperature (F): 98.1 Height (in): 77 Pulse (bpm): 93 Weight (lbs): 280 Respiratory Rate (breaths/min): 18 Body Mass Index (BMI): 33.2 Blood Pressure (mmHg): 125/75 Capillary Blood Glucose (mg/dl): 90 Reference Range: 80 - 120 mg / dl [place], [name] ([id number]) [id number] Page 4 of 4 Notes 1040 Patient states, "I have not ate since last night. My blood glucose was 170 this AM." Explained has to eat before starting HBO. Explained no one  scheduled after him will allow him to go eat then return. Patient ate [employer] 6in sandwich and apple juice. 1057 check blood glucose 90. Glucerna given. Per Dr Mikey Bussing to give additional 8oz OJ, given. Rechecked blood glucose at 1127- 87. Per Dr. Mikey Bussing 1132 to give x2 OJ and peanut Butter and graham crackers, given. 1202- recheck 102. Dr. Mikey Bussing says patient can continue to eat another snack or scrub dive. Per patient rather scrub dive. Electronic Signature(s) Signed: 11/16/2022 6:05:41  PM By: Shawn Stall RN, BSN Entered By: Shawn Stall on 11/15/2022 12:12:36

## 2022-11-17 NOTE — Progress Notes (Signed)
Subjective: Chief Complaint  Patient presents with   Routine Post Op    POV #1 DOS 10/26/22 --- FIFTH METATARSAL RESECTION, BONE BIOPSY LEFT FOOT, DEBRIDEMENT OF ULCER LEFT AND RIGHT WITH POSSIBLE GRAFT APPLICATION - Dressing change    37 year old male presents for above concerns.  He presents today for dressing change.  Denies any fevers or chills or any significant pain.   Objective: AAO x3, NAD DP/PT pulses palpable bilaterally, CRT less than 3 seconds Status post bilateral ulcer debridement.  Grafts are partially intact with staples within the graft intact.  The left foot incision well coapted with sutures, staples intact.  There is some macerated tissue noted in the left foot, but improved. There is no significant cellulitis.  No fluctuation or crepitation but there is no malodor. 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  -It appears that the graft on the plantar aspect of the foot as the graft is partially detached.  I remove the staples from the part where it was detached.  Ulcer dressing applied bilaterally. -Continue with offloading -Antibiotics per ID -Continue HBO with the wound care center.  -Monitor for any clinical signs or symptoms of infection and directed to call the office immediately should any occur or go to the ER. -Patient encouraged to call the office with any questions, concerns, change in symptoms.   Return in about 1 week (around 11/18/2022).  Vivi Barrack DPM

## 2022-11-17 NOTE — Progress Notes (Signed)
[place], [name] ([id number]) [id number] Page 1 of 2 Visit Report for 11/16/2022 HBO Details Patient Name: Date of Service: [name] 11/16/2022 10:00 A M Medical Record Number: [id number] Patient Account Number: [id number] Date of Birth/Sex: Treating RN: 27-Jan-1986 (37 y.o. [name] Primary Care : [name] Other Clinician: Referring : Treating /Extender: [name] in Treatment: 159 HBO Treatment Course Details Treatment Course Number: 2 Ordering : Duanne Guess T Treatments Ordered: otal 40 HBO Treatment Start Date: 10/04/2022 HBO Indication: Diabetic Ulcer(s) of the Lower Extremity Wound #3 Left, Lateral, Plantar Foot HBO Treatment Details Treatment Number: 14 Patient Type: Outpatient Chamber Type: Monoplace Chamber Serial #: [id number] Treatment Protocol: 2.0 ATA with 90 minutes oxygen, and no air breaks Treatment Details Compression Rate Down: 2.0 psi / minute De-Compression Rate Up: Air breaks and breathing Decompress Decompress Compress Tx Pressure Begins Reached periods Begins Ends (leave unused spaces blank) Chamber Pressure (ATA 1 2 ------2 1 ) Clock Time (24 hr) 10:56 11:07 - - - - - - 12:27 12:46 Treatment Length: 110 (minutes) Treatment Segments: 4 Vital Signs Capillary Blood Glucose Reference Range: 80 - 120 mg / dl HBO Diabetic Blood Glucose Intervention Range: <131 mg/dl or >782 mg/dl Time Vitals Blood Respiratory Capillary Blood Glucose Pulse Action Type: Pulse: Temperature: Taken: Pressure: Rate: Glucose (mg/dl): Meter #: Oximetry (%) Taken: Pre 10:48 127/87 97 18 97.3 106 Post 12:49 151/90 80 18 97.5 111 Treatment Response Treatment Toleration: Well Treatment Completion Status: Treatment Completed without Adverse Event Additional Procedure Documentation Tissue Sevierity: Bone involvement without necrosis Physician HBO Attestation: I  certify that I supervised this HBO treatment in accordance with Medicare guidelines. A trained emergency response team is readily available per Yes hospital policies and procedures. Continue HBOT as ordered. Yes Electronic Signature(s) Signed: 11/16/2022 2:34:03 PM By: Duanne Guess MD FACS Entered By: Duanne Guess on 11/16/2022 14:34:03 [place], [name] ([id number]) [id number] Page 2 of 2 -------------------------------------------------------------------------------- HBO Safety Checklist Details Patient Name: Date of Service: [name] 11/16/2022 10:00 A M Medical Record Number: [id number] Patient Account Number: [id number] Date of Birth/Sex: Treating RN: 1985/10/25 (37 y.o. [name] Primary Care : [name] Other Clinician: Referring : Treating /Extender: [name] in Treatment: 159 HBO Safety Checklist Items Safety Checklist Consent Form Signed Patient voided / foley secured and emptied When did you last eato 0700 Last dose of injectable or oral agent 8/13 at 1500 Ostomy pouch emptied and vented if applicable NA All implantable devices assessed, documented and approved NA Intravenous access site secured and place NA Valuables secured Linens and cotton and cotton/polyester blend (less than 51% polyester) Personal oil-based products / skin lotions / body lotions removed Wigs or hairpieces removed Smoking or tobacco materials removed Books / newspapers / magazines / loose paper removed Cologne, aftershave, perfume and deodorant removed Jewelry removed (may wrap wedding band) Make-up removed Hair care products removed Battery operated devices (external) removed Heating patches and chemical warmers removed Titanium eyewear removed Nail polish cured greater than 10 hours NA Casting material cured greater than 10 hours NA Hearing aids removed NA Loose dentures or  partials removed NA Prosthetics have been removed NA Patient demonstrates correct use of air break device (if applicable) Patient concerns have been addressed Patient grounding bracelet on and cord attached to chamber Specifics for Inpatients (complete in addition to above) Medication sheet sent with patient NA Intravenous medications needed or due during therapy sent with patient NA Drainage tubes (  e.g. nasogastric tube or chest tube secured and vented) NA Endotracheal or Tracheotomy tube secured NA Cuff deflated of air and inflated with saline NA Airway suctioned NA Electronic Signature(s) Signed: 11/16/2022 5:11:24 PM By: Zenaida Deed RN, BSN Entered By: Zenaida Deed on 11/16/2022 11:35:06

## 2022-11-18 ENCOUNTER — Ambulatory Visit (INDEPENDENT_AMBULATORY_CARE_PROVIDER_SITE_OTHER): Payer: BC Managed Care – PPO | Admitting: Podiatry

## 2022-11-18 ENCOUNTER — Encounter (HOSPITAL_BASED_OUTPATIENT_CLINIC_OR_DEPARTMENT_OTHER): Payer: BC Managed Care – PPO | Admitting: General Surgery

## 2022-11-18 ENCOUNTER — Encounter: Payer: BC Managed Care – PPO | Admitting: Podiatry

## 2022-11-18 DIAGNOSIS — L97312 Non-pressure chronic ulcer of right ankle with fat layer exposed: Secondary | ICD-10-CM

## 2022-11-18 DIAGNOSIS — M86272 Subacute osteomyelitis, left ankle and foot: Secondary | ICD-10-CM

## 2022-11-18 DIAGNOSIS — E11622 Type 2 diabetes mellitus with other skin ulcer: Secondary | ICD-10-CM | POA: Diagnosis not present

## 2022-11-18 LAB — GLUCOSE, CAPILLARY
Glucose-Capillary: 136 mg/dL — ABNORMAL HIGH (ref 70–99)
Glucose-Capillary: 148 mg/dL — ABNORMAL HIGH (ref 70–99)

## 2022-11-18 NOTE — Progress Notes (Addendum)
[place], [name] ([id number]) [id number] Page 1 of 2 Visit Report for 11/18/2022 Arrival Information Details Patient Name: Date of Service: [name] D 11/18/2022 8:00 A M Medical Record Number: [id number] Patient Account Number: [id number] Date of Birth/Sex: Treating RN: 1986/03/13 (37 y.o. [name], Bonita Quin Primary Care Jowel Waltner: [name] Other Clinician: [name] Referring Rebeccah Ivins: Treating Jaishon Krisher/Extender: [name] in Treatment: 160 Visit Information History Since Last Visit All ordered tests and consults were completed: Yes Patient Arrived: [name] Added or deleted any medications: No Arrival Time: 07:06 Any new allergies or adverse reactions: No Accompanied By: None Had a fall or experienced change in No Transfer Assistance: None activities of daily living that may affect Patient Identification Verified: Yes risk of falls: Secondary Verification Process Completed: Yes Signs or symptoms of abuse/neglect since last visito No Patient Requires Transmission-Based Precautions: No Hospitalized since last visit: No Patient Has Alerts: No Implantable device outside of the clinic excluding No cellular tissue based products placed in the center since last visit: Pain Present Now: No Electronic Signature(s) Signed: 11/18/2022 9:27:09 AM By: [name] EMT Entered By: [name] on 11/18/2022 09:27:09 -------------------------------------------------------------------------------- Encounter Discharge Information Details Patient Name: Date of Service: [name], [name] 11/18/2022 8:00 A M Medical Record Number: [id number] Patient Account Number: [id number] Date of Birth/Sex: Treating RN: [birth year] (37 y.o. [name] Primary Care Devaun Hernandez: [name] Other Clinician: [name] Referring Adalina Dopson: Treating Deryl Giroux/Extender: [name] in Treatment:  160 Encounter Discharge Information Items Discharge Condition: Stable Ambulatory Status: Walker Discharge Destination: Home Transportation: Private Auto Accompanied By: None Schedule Follow-up Appointment: No Clinical Summary of Care: Electronic Signature(s) Signed: 11/18/2022 10:53:36 AM By: [name] EMT Entered By: [name] on 11/18/2022 10:53:36 [name], [name] ([id number]) [id number] Page 2 of 2 -------------------------------------------------------------------------------- Vitals Details Patient Name: Date of Service: [name] D 11/18/2022 8:00 A M Medical Record Number: [id number] Patient Account Number: [id number] Date of Birth/Sex: Treating RN: 12/01/1985 (37 y.o. [name] Primary Care Nyema Hachey: [name] Other Clinician: [name] Referring Quatavious Rossa: Treating Samra Pesch/Extender: [name] in Treatment: 160 Vital Signs Time Taken: 07:28 Temperature (F): 97.6 Height (in): 77 Pulse (bpm): 84 Weight (lbs): 280 Respiratory Rate (breaths/min): 18 Body Mass Index (BMI): 33.2 Blood Pressure (mmHg): 130/71 Capillary Blood Glucose (mg/dl): 433 Reference Range: 80 - 120 mg / dl Electronic Signature(s) Signed: 11/18/2022 9:27:42 AM By: [name] EMT Entered By: [name] on 11/18/2022 09:27:42

## 2022-11-18 NOTE — Progress Notes (Addendum)
[place], [name] ([id number]) [id number] Page 1 of 2 Visit Report for 11/18/2022 HBO Details Patient Name: Date of Service: [name] 11/18/2022 8:00 A M Medical Record Number: [id number] Patient Account Number: [id number] Date of Birth/Sex: Treating RN: 1985/08/04 (37 y.o. [name] Primary Care Derra Shartzer: [name] Other Clinician: [name] Referring Cambryn Charters: Treating Kamea Dacosta/Extender: [name] in Treatment: 160 HBO Treatment Course Details Treatment Course Number: 2 Ordering Illyria Sobocinski: Duanne Guess T Treatments Ordered: otal 40 HBO Treatment Start Date: 10/04/2022 HBO Indication: Diabetic Ulcer(s) of the Lower Extremity Wound #3 Left, Lateral, Plantar Foot HBO Treatment Details Treatment Number: 16 Patient Type: Outpatient Chamber Type: Monoplace Chamber Serial #: [id number] Treatment Protocol: 2.0 ATA with 90 minutes oxygen, and no air breaks Treatment Details Compression Rate Down: 2.0 psi / minute De-Compression Rate Up: 2.0 psi / minute Air breaks and breathing Decompress Decompress Compress Tx Pressure Begins Reached periods Begins Ends (leave unused spaces blank) Chamber Pressure (ATA 1 2 ------2 1 ) Clock Time (24 hr) 07:53 08:03 - - - - - - 09:33 09:40 Treatment Length: 107 (minutes) Treatment Segments: 4 Vital Signs Capillary Blood Glucose Reference Range: 80 - 120 mg / dl HBO Diabetic Blood Glucose Intervention Range: <131 mg/dl or >086 mg/dl Time Vitals Blood Respiratory Capillary Blood Glucose Pulse Action Type: Pulse: Temperature: Taken: Pressure: Rate: Glucose (mg/dl): Meter #: Oximetry (%) Taken: Pre 07:28 130/71 84 18 97.6 136 Post 09:45 153/95 76 18 98.2 148 Treatment Response Treatment Toleration: Well Treatment Completion Status: Treatment Completed without Adverse Event Additional Procedure Documentation Tissue Sevierity: Necrosis of bone Physician HBO  Attestation: I certify that I supervised this HBO treatment in accordance with Medicare guidelines. A trained emergency response team is readily available per Yes hospital policies and procedures. Continue HBOT as ordered. Yes Electronic Signature(s) Signed: 11/18/2022 11:56:46 AM By: Duanne Guess MD FACS Previous Signature: 11/18/2022 10:51:45 AM Version By: [name] EMT Previous Signature: 11/18/2022 9:30:35 AM Version By: [name] EMT Entered By: Duanne Guess on 11/18/2022 11:56:46 [place], [name] ([id number]) [id number] Page 2 of 2 -------------------------------------------------------------------------------- HBO Safety Checklist Details Patient Name: Date of Service: [name] 11/18/2022 8:00 A M Medical Record Number: [id number] Patient Account Number: [id number] Date of Birth/Sex: Treating RN: [birth year] (37 y.o. [name] Primary Care Jaxden Blyden: [name] Other Clinician: [name] Referring Svetlana Bagby: Treating Uriyah Raska/Extender: [name] in Treatment: 160 HBO Safety Checklist Items Safety Checklist Consent Form Signed Patient voided / foley secured and emptied When did you last eato 0650 Last dose of injectable or oral agent Yesterday 1400 60 units Lantus Ostomy pouch emptied and vented if applicable NA All implantable devices assessed, documented and approved NA Intravenous access site secured and place NA Valuables secured Linens and cotton and cotton/polyester blend (less than 51% polyester) Personal oil-based products / skin lotions / body lotions removed Wigs or hairpieces removed NA Smoking or tobacco materials removed Books / newspapers / magazines / loose paper removed Cologne, aftershave, perfume and deodorant removed Jewelry removed (may wrap wedding band) Make-up removed NA Hair care products removed Battery operated devices (external) removed Libre Heating  patches and chemical warmers removed Titanium eyewear removed NA Nail polish cured greater than 10 hours NA Casting material cured greater than 10 hours NA Hearing aids removed NA Loose dentures or partials removed NA Prosthetics have been removed NA Patient demonstrates correct use of air break device (if applicable) Patient concerns have been addressed Patient grounding bracelet  on and cord attached to chamber Specifics for Inpatients (complete in addition to above) Medication sheet sent with patient NA Intravenous medications needed or due during therapy sent with patient NA Drainage tubes (e.g. nasogastric tube or chest tube secured and vented) NA Endotracheal or Tracheotomy tube secured NA Cuff deflated of air and inflated with saline NA Airway suctioned NA Notes the safety checklist was done before the treatment was started. Electronic Signature(s) Signed: 11/18/2022 9:29:47 AM By: [name] EMT Entered By: [name] on 11/18/2022 09:29:47

## 2022-11-18 NOTE — Progress Notes (Signed)
[place], [name] ([id number]) [id number] Page 1 of 3 Visit Report for 11/18/2022 Problem List Details Patient Name: Date of Service: [name] D 11/18/2022 8:00 A M Medical Record Number: [id number] Patient Account Number: [id number] Date of Birth/Sex: Treating RN: May 13, 1985 (37 y.o. 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 09/28/2022 No Yes without evidence of necrosis L97.512 Non-pressure chronic ulcer of other part of right foot with fat layer 10/04/2022 No Yes exposed E11.621 Type 2 diabetes mellitus with foot ulcer 10/24/2019 No Yes E11.622 Type 2 diabetes mellitus with other skin ulcer 09/28/2022 No Yes Inactive Problems ICD-10 Code Description Active Date Inactive Date L97.518 Non-pressure chronic ulcer of other part of right foot with other specified 07/14/2020 07/14/2020 severity L97.518 Non-pressure chronic ulcer of other part of right foot with other specified 10/24/2019 10/24/2019 severity 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, right ankle and foot 10/24/2019 10/24/2019 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 08/10/2020 08/10/2020 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, left ankle and foot 10/24/2019 10/24/2019 L97.322 Non-pressure chronic ulcer of left ankle with fat layer exposed 09/29/2021 09/29/2021 [place], [name] ([id number]) [id number].pdf Page 2 of 3 [id number] Non-pressure chronic ulcer of other part of left  foot with fat layer exposed 09/07/2022 09/07/2022 B95.62 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 10/24/2019 10/24/2019 classified elsewhere Resolved Problems ICD-10 Code Description Active Date Resolved Date L89.892 Pressure ulcer of other site, stage 2 07/27/2022 07/27/2022 Electronic Signature(s) Signed: 11/18/2022 10:52:46 AM By: [name] EMT Signed: 11/18/2022 11:55:54 AM By: Duanne Guess MD FACS Entered By: [name] on 11/18/2022 10:52:45 -------------------------------------------------------------------------------- SuperBill Details Patient Name: Date of Service: [name], [name] 11/18/2022 Medical Record Number: [id number] Patient Account Number: [id number] Date of Birth/Sex: Treating RN: 1985-05-22 (37 y.o. 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 without evidence of necrosis L97.512 Non-pressure chronic ulcer of other part of right foot with fat layer exposed E11.621 Type 2 diabetes mellitus with foot ulcer E11.622 Type 2 diabetes mellitus with other skin ulcer Facility Procedures : CPT4 Code: [id number] Description: G0277-(Facility Use Only) HBOT full body chamber, , ICD-10 Diagnosis Description 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 M86.172 Other acute  osteomyelitis, left ankle and foot E11.622 Type 2 diabetes mellitus with other skin ulcer Modifier: one Quantity: 4 Physician Procedures : CPT4 Code Description Modifier  [id number] - WC PHYS HYPERBARIC OXYGEN THERAPY ICD-10 Diagnosis Description 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

## 2022-11-19 NOTE — Progress Notes (Signed)
Subjective: No chief complaint on file.   37 year old male presents today for dressing change and suture, staple removal.  He is not changing the bandages every couple of days and applying Adaptic.  Does not report any fevers or chills.  No pain.  He sees wound care on Tuesday.  No other concerns.  Objective: AAO x3, NAD DP/PT pulses palpable bilaterally, CRT less than 3 seconds Status post bilateral ulcer debridement.  Grafts are partially intact with staples within the graft intact.  The left foot incision well coapted with sutures, staples intact.  No significant macerated tissue was noted.  On the periphery of the wound on the plantar aspect left foot there is some green discoloration area of hyperkeratotic tissue.  Upon debridement this was able to be removed and there is no drainage or pus noted today.. There is no significant cellulitis.  No fluctuation or crepitation but there is no malodor. Ulceration on the anterior aspect of right ankle appears to be filling in.  There is no exposed tendon today.  No surrounding erythema, ascending size.  No drainage or pus.  No discoloration. 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  -I removed the sutures, staples today from the graft site as well as the incision.  Vision is well coapted.  Xeroform was applied followed by dressing.  Discussed daily dressing changes.   -I debrided hyperkeratotic tissue on the periphery of the wound on the plantar aspect left foot today without any complications.  There was some green discoloration likely from moisture.  There is no purulence.  There is no further discoloration with a debrided this.  Discussed daily dressing changes to help with this as well. -Continue with offloading -Antibiotics per ID -Continue HBO with the  wound care center.  -Monitor for any clinical signs or symptoms of infection and directed to call the office immediately should any occur or go to the ER. -Patient encouraged to call the office with any questions, concerns, change in symptoms.   Return in about 2 weeks (around 12/02/2022) for wound check .  Vivi Barrack DPM

## 2022-11-21 ENCOUNTER — Encounter (HOSPITAL_BASED_OUTPATIENT_CLINIC_OR_DEPARTMENT_OTHER): Payer: BC Managed Care – PPO | Admitting: General Surgery

## 2022-11-21 DIAGNOSIS — E11622 Type 2 diabetes mellitus with other skin ulcer: Secondary | ICD-10-CM | POA: Diagnosis not present

## 2022-11-21 LAB — GLUCOSE, CAPILLARY
Glucose-Capillary: 126 mg/dL — ABNORMAL HIGH (ref 70–99)
Glucose-Capillary: 159 mg/dL — ABNORMAL HIGH (ref 70–99)
Glucose-Capillary: 153 mg/dL — ABNORMAL HIGH (ref 70–99)

## 2022-11-21 NOTE — Progress Notes (Addendum)
[place], [name] ([id number]) [id number] Page 1 of 3 Visit Report for 11/21/2022 HBO Details Patient Name: Date of Service: [name] D 11/21/2022 10:00 A M Medical Record Number: [id number] Patient Account Number: [id number] Date of Birth/Sex: Treating RN: 06-25-1985 (37 y.o. [name], [name] Primary Care Tanay Massiah: [name] Other Clinician: Haywood Pao Referring Ricca Melgarejo: Treating Rochell Mabie/Extender: [name] in Treatment: 160 HBO Treatment Course Details Treatment Course Number: 2 Ordering Jaycee Pelzer: Duanne Guess T Treatments Ordered: otal 40 HBO Treatment Start Date: 10/04/2022 HBO Indication: Diabetic Ulcer(s) of the Lower Extremity Wound #3 Left, Lateral, Plantar Foot HBO Treatment Details Treatment Number: 17 Patient Type: Outpatient Chamber Type: Monoplace Chamber Serial #: [id number] Treatment Protocol: 2.0 ATA with 90 minutes oxygen, and no air breaks Treatment Details Compression Rate Down: 1.5 psi / minute De-Compression Rate Up: 2.0 psi / minute Air breaks and breathing Decompress Decompress Compress Tx Pressure Begins Reached periods Begins Ends (leave unused spaces blank) Chamber Pressure (ATA 1 2 ------2 1 ) Clock Time (24 hr) 10:40 10:50 - - - - - - 12:20 12:29 Treatment Length: 109 (minutes) Treatment Segments: 4 Vital Signs Capillary Blood Glucose Reference Range: 80 - 120 mg / dl HBO Diabetic Blood Glucose Intervention Range: <131 mg/dl or >578 mg/dl Type: Time Vitals Blood Pulse: Respiratory Temperature: Capillary Blood Glucose Pulse Action Taken: Pressure: Rate: Glucose (mg/dl): Meter #: Oximetry (%) Taken: Pre 09:47 126 8 oz Glucerna given, wait 30 minutes, retest. Pre 10:17 127/79 92 18 98.2 159 Post 12:32 167/103 81 18 98.4 153 remeasure BP manually Post 12:40 184/118 manual BP Treatment Response Treatment Toleration: Well Treatment Completion Status: Treatment Completed  without Adverse Event Treatment Notes Mr. Mccrimon arrived with blood glucose level of 126 mg/dL. Patient was given 8 oz Glucerna and after 30 minutes his blood glucose level was remeasured with result of 159 mg/dL. Other vital signs were within normal range. After performing a safety check, patient was placed in the chamber travelling at a rate of 2 psi/min after confirmation of normal ear equalization. He tolerated the treatment and the decompression phase that followed at a rate of 2 psi/min. He denied any issues with ear equalization and/or pain associated with barotrauma. His post treatment diastolic blood pressure was elevated. BP was re-taken with manual BP cuff after patient prepared for departure. Dr. Lady Gary informed of BP. Patient stated that he felt okay. He was stable upon discharge. Additional Procedure Documentation Tissue Sevierity: Necrosis of bone Physician HBO Attestation: I certify that I supervised this HBO treatment in accordance with Medicare guidelines. A trained emergency response team is readily available per Yes hospital policies and procedures. Continue HBOT as ordered. Yes [place], [name] ([id number]) [id number] Page 2 of 3 Electronic Signature(s) Signed: 11/21/2022 1:48:56 PM By: Duanne Guess MD FACS Previous Signature: 11/21/2022 1:34:19 PM Version By: Haywood Pao CHT EMT BS , , Previous Signature: 11/21/2022 1:33:56 PM Version By: Haywood Pao CHT EMT BS , , Previous Signature: 11/21/2022 12:19:24 PM Version By: Haywood Pao CHT EMT BS , , Previous Signature: 11/21/2022 12:01:40 PM Version By: Haywood Pao CHT EMT BS , , Entered By: Duanne Guess on 11/21/2022 13:48:55 -------------------------------------------------------------------------------- HBO Safety Checklist Details Patient Name: Date of Service: [name], [name] 11/21/2022 10:00 A M Medical Record Number: [id number] Patient Account Number:  [id number] Date of Birth/Sex: Treating RN: 07-20-1985 (37 y.o. [name] Primary Care Gaurav Baldree: [name] Other Clinician: Haywood Pao Referring Hisae Decoursey: Treating Elaysia Devargas/Extender: [name] in Treatment:  160 HBO Safety Checklist Items Safety Checklist Consent Form Signed Patient voided / foley secured and emptied When did you last eato 0830 - Yogurt Last dose of injectable or oral agent Yesterday   1300 Ostomy pouch emptied and vented if applicable NA All implantable devices assessed, documented and approved Freestyle Libre 2 and3 on approved list Intravenous access site secured and place NA Valuables secured Linens and cotton and cotton/polyester blend (less than 51% polyester) Personal oil-based products / skin lotions / body lotions removed Wigs or hairpieces removed NA Smoking or tobacco materials removed NA Books / newspapers / magazines / loose paper removed Cologne, aftershave, perfume and deodorant removed Jewelry removed (may wrap wedding band) Make-up removed NA Hair care products removed Battery operated devices (external) removed Heating patches and chemical warmers removed Titanium eyewear removed Nail polish cured greater than 10 hours NA Casting material cured greater than 10 hours NA Hearing aids removed NA Loose dentures or partials removed NA Prosthetics have been removed NA Patient demonstrates correct use of air break device (if applicable) Patient concerns have been addressed Patient grounding bracelet on and cord attached to chamber Specifics for Inpatients (complete in addition to above) Medication sheet sent with patient NA Intravenous medications needed or due during therapy sent with patient NA Drainage tubes (e.g. nasogastric tube or chest tube secured and vented) NA Endotracheal or Tracheotomy tube secured NA Cuff deflated of air and inflated with saline NA Airway  suctioned NA Notes Paper version used prior to treatment start. Electronic Signature(s) Signed: 11/21/2022 11:51:31 AM By: Haywood Pao CHT EMT BS , , [place], [name] ([id number]) [id number] Page 3 of 3 Entered By: Haywood Pao on 11/21/2022 11:51:31

## 2022-11-21 NOTE — Progress Notes (Signed)
[place], [name] ([id number]) [id number] Page 1 of 1 Visit Report for 11/21/2022 SuperBill Details Patient Name: Date of Service: [name] 11/21/2022 Medical Record Number: [id number] Patient Account Number: [id number] Date of Birth/Sex: Treating RN: 04-21-1985 (37 y.o. 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 without evidence of necrosis L97.512 Non-pressure chronic ulcer of other part of right foot with fat layer exposed E11.621 Type 2 diabetes mellitus with foot ulcer E11.622 Type 2 diabetes mellitus with other skin ulcer Facility Procedures CPT4 Code Description Modifier Quantity [id number] G0277-(Facility Use Only) HBOT full body chamber, , 4 ICD-10 Diagnosis Description 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 M86.172 Other acute osteomyelitis, left ankle and foot E11.622 Type 2 diabetes mellitus with other skin ulcer Physician Procedures Quantity CPT4 Code Description Modifier [id number] - WC PHYS HYPERBARIC OXYGEN THERAPY 1 ICD-10 Diagnosis Description 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

## 2022-11-21 NOTE — Progress Notes (Addendum)
[place], [name] ([id number]) [id number] Page 1 of 2 Visit Report for 11/21/2022 Arrival Information Details Patient Name: Date of Service: [name] 11/21/2022 10:00 A M Medical Record Number: [id number] Patient Account Number: [id number] Date of Birth/Sex: Treating RN: Apr 30, 1985 (37 y.o. [name], [name] Primary Care Avigayil Ton: [name] Other Clinician: [name] Referring Lynnell Fiumara: Treating Byren Pankow/Extender: [name] in Treatment: 160 Visit Information History Since Last Visit All ordered tests and consults were completed: Yes Patient Arrived: [name] Added or deleted any medications: No Arrival Time: 09:42 Any new allergies or adverse reactions: No Accompanied By: self Had a fall or experienced change in No Transfer Assistance: None activities of daily living that may affect Patient Identification Verified: Yes risk of falls: Secondary Verification Process Completed: Yes Signs or symptoms of abuse/neglect since last visito No Patient Requires Transmission-Based Precautions: No Hospitalized since last visit: No Patient Has Alerts: No Implantable device outside of the clinic excluding No cellular tissue based products placed in the center since last visit: Has Dressing in Place as Prescribed: Yes Pain Present Now: No Electronic Signature(s) Signed: 11/21/2022 12:02:35 PM By: [name] CHT EMT BS , , Previous Signature: 11/21/2022 12:02:01 PM Version By: [name] CHT EMT BS , , Previous Signature: 11/21/2022 11:48:51 AM Version By: [name] CHT EMT BS , , Entered By: [name] on 11/21/2022 09:02:35 -------------------------------------------------------------------------------- Encounter Discharge Information Details Patient Name: Date of Service: [name], [name] 11/21/2022 10:00 A M Medical Record Number: [id number] Patient Account Number: [id number] Date of  Birth/Sex: Treating RN: [birth year] (37 y.o. [name] Primary Care [name]: [name] Other Clinician: Haywood [name] Referring [name]: Treating [name]/Extender: [name] in Treatment: 160 Encounter Discharge Information Items Discharge Condition: Stable Ambulatory Status: Ambulatory Discharge Destination: Home Transportation: Private Auto Accompanied By: self Schedule Follow-up Appointment: No Clinical Summary of Care: Electronic Signature(s) Signed: 11/21/2022 1:35:22 PM By: [name] CHT EMT BS , , Entered By: [name] on 11/21/2022 10:35:21 [place], [name] ([id number]) [id number] Page 2 of 2 -------------------------------------------------------------------------------- Vitals Details Patient Name: Date of Service: [name] 11/21/2022 10:00 A M Medical Record Number: [id number] Patient Account Number: [id number] Date of Birth/Sex: Treating RN: 1985/05/04 (37 y.o. [name] Primary Care Aashika Carta: [name] Other Clinician: Haywood [name] Referring Aveon Colquhoun: Treating Ramah Langhans/Extender: [name] in Treatment: 160 Vital Signs Time Taken: 09:47 Temperature (F): 98.2 Height (in): 77 Pulse (bpm): 92 Weight (lbs): 280 Respiratory Rate (breaths/min): 18 Body Mass Index (BMI): 33.2 Blood Pressure (mmHg): 127/79 Capillary Blood Glucose (mg/dl): 485 Reference Range: 80 - 120 mg / dl Notes Blood Glucose Level was taken at 0947, other vital signs taken with secondary blood glucose reading at 1017. Electronic Signature(s) Signed: 11/21/2022 12:03:40 PM By: [name] CHT EMT BS , , Previous Signature: 11/21/2022 11:49:31 AM Version By: [name] CHT EMT BS , , Entered By: [name] on 11/21/2022 09:03:39

## 2022-11-22 ENCOUNTER — Encounter (HOSPITAL_BASED_OUTPATIENT_CLINIC_OR_DEPARTMENT_OTHER): Payer: BC Managed Care – PPO | Admitting: General Surgery

## 2022-11-22 DIAGNOSIS — E11622 Type 2 diabetes mellitus with other skin ulcer: Secondary | ICD-10-CM | POA: Diagnosis not present

## 2022-11-22 LAB — GLUCOSE, CAPILLARY
Glucose-Capillary: 110 mg/dL — ABNORMAL HIGH (ref 70–99)
Glucose-Capillary: 122 mg/dL — ABNORMAL HIGH (ref 70–99)
Glucose-Capillary: 156 mg/dL — ABNORMAL HIGH (ref 70–99)
Glucose-Capillary: 133 mg/dL — ABNORMAL HIGH (ref 70–99)

## 2022-11-22 NOTE — Progress Notes (Addendum)
[place], [name] ([id number]) [id number] Page 1 of 12 Visit Report for 11/22/2022 Arrival Information Details Patient Name: Date of Service: [name] 11/22/2022 8:15 A M Medical Record Number: [id number] Patient Account Number: [id number] Date of Birth/Sex: Treating RN: 1985/08/17 (37 y.o. M) Primary Care Elverda Wendel: [name] Other Clinician: Referring Daris Aristizabal: Treating Gabriell Daigneault/Extender: [name] in Treatment: 160 Visit Information History Since Last Visit All ordered tests and consults were completed: No Patient Arrived: [name] Added or deleted any medications: No Arrival Time: 07:54 Any new allergies or adverse reactions: No Accompanied By: self Had a fall or experienced change in No Transfer Assistance: None activities of daily living that may affect Patient Identification Verified: Yes risk of falls: Secondary Verification Process Completed: Yes Signs or symptoms of abuse/neglect since last visito No Patient Requires Transmission-Based Precautions: No Hospitalized since last visit: No Patient Has Alerts: No Implantable device outside of the clinic excluding No cellular tissue based products placed in the center since last visit: Pain Present Now: No Electronic Signature(s) Signed: 11/22/2022 8:17:59 AM By: Dayton Scrape Entered By: Dayton Scrape on 11/22/2022 04:55:02 -------------------------------------------------------------------------------- Clinic Level of Care Assessment Details Patient Name: Date of Service: [name] 11/22/2022 8:15 A M Medical Record Number: [id number] Patient Account Number: [id number] Date of Birth/Sex: Treating RN: [birth year] (37 y.o. [name] Primary Care Regan Llorente: [name] Other Clinician: Referring Kassia Demarinis: Treating Shandee Jergens/Extender: [name] in Treatment: 160 Clinic Level of Care Assessment Items TOOL 3 Quantity  Score X- 1 0 Use when EandM and Procedure is performed on FOLLOW-UP visit ASSESSMENTS - Nursing Assessment / Reassessment X- 1 10 Reassessment of Co-morbidities (includes updates in patient status) X- 1 5 Reassessment of Adherence to Treatment Plan ASSESSMENTS - Wound and Skin Assessment / Reassessment []  - Points for Wound Assessment can only be taken for a new wound of unknown or different etiology and a procedure is 0 NOT performed to that wound []  - 0 Simple Wound Assessment / Reassessment - one wound []  - 0 Complex Wound Assessment / Reassessment - multiple wounds []  - 0 Dermatologic / Skin Assessment (not related to wound area) ASSESSMENTS - Focused Assessment []  - 0 Circumferential Edema Measurements - multi extremities [place], [name] ([id number]) [id number] Page 2 of 12 []  - 0 Nutritional Assessment / Counseling / Intervention []  - 0 Lower Extremity Assessment (monofilament, tuning fork, pulses) []  - 0 Peripheral Arterial Disease Assessment (using hand held doppler) ASSESSMENTS - Ostomy and/or Continence Assessment and Care []  - 0 Incontinence Assessment and Management []  - 0 Ostomy Care Assessment and Management (repouching, etc.) PROCESS - Coordination of Care []  - Points for Discharge Coordination can only be taken for a new wound of unknown or different etiology and a procedure 0 is NOT performed to that wound []  - 0 Simple Patient / Family Education for ongoing care []  - 0 Complex (extensive) Patient / Family Education for ongoing care []  - 0 Staff obtains Chiropractor, Records, T Results / Process Orders est []  - 0 Staff telephones HHA, Nursing Homes / Clarify orders / etc []  - 0 Routine Transfer to another Facility (non-emergent condition) []  - 0 Routine Hospital Admission (non-emergent condition) []  - 0 New Admissions / Manufacturing engineer / Ordering NPWT Apligraf, etc. , []  - 0 Emergency Hospital Admission (emergent  condition) []  - 0 Simple Discharge Coordination []  - 0 Complex (extensive) Discharge Coordination PROCESS - Special Needs []  - 0 Pediatric / Minor Patient Management []  -  0 Isolation Patient Management []  - 0 Hearing / Language / Visual special needs []  - 0 Assessment of Community assistance (transportation, [name]/C planning, etc.) []  - 0 Additional assistance / Altered mentation []  - 0 Support Surface(s) Assessment (bed, cushion, seat, etc.) INTERVENTIONS - Wound Cleansing / Measurement []  - Points for Wound Cleaning / Measurement, Wound Dressing, Specimen Collection and Specimen taken to lab can only 0 be taken for a new wound of unknown or different etiology and a procedure is NOT performed to that wound []  - 0 Simple Wound Cleansing - one wound []  - 0 Complex Wound Cleansing - multiple wounds []  - 0 Wound Imaging (photographs - any number of wounds) []  - 0 Wound Tracing (instead of photographs) []  - 0 Simple Wound Measurement - one wound []  - 0 Complex Wound Measurement - multiple wounds INTERVENTIONS - Wound Dressings []  - 0 Small Wound Dressing one or multiple wounds []  - 0 Medium Wound Dressing one or multiple wounds []  - 0 Large Wound Dressing one or multiple wounds INTERVENTIONS - Miscellaneous []  - 0 External ear exam []  - 0 Specimen Collection (cultures, biopsies, blood, body fluids, etc.) []  - 0 Specimen(s) / Culture(s) sent or taken to Lab for analysis []  - 0 Patient Transfer (multiple staff / Michiel Sites Lift / Similar devices) X- 1 5 Simple Staple / Suture removal (25 or less) [place], [name] ([id number]) [id number] Page 3 of 12 []  - 0 Complex Staple / Suture removal (26 or more) []  - 0 Hypo / Hyperglycemic Management (close monitor of Blood Glucose) []  - 0 Ankle / Brachial Index (ABI) - do not check if billed separately []  - 0 Vital Signs Has the patient been seen at the hospital within the last three years: Yes Total  Score: 20 Level Of Care: New/Established - Level 1 Electronic Signature(s) Signed: 11/22/2022 4:13:58 PM By: Karie Schwalbe RN Entered By: Karie Schwalbe on 11/22/2022 06:25:07 -------------------------------------------------------------------------------- Encounter Discharge Information Details Patient Name: Date of Service: [name], [name] 11/22/2022 8:15 A M Medical Record Number: [id number] Patient Account Number: [id number] Date of Birth/Sex: Treating RN: 06/24/1985 (37 y.o. [name] Primary Care Alyxis Grippi: [name] Other Clinician: Referring Jeb Schloemer: Treating Cormick Moss/Extender: [name] in Treatment: 160 Encounter Discharge Information Items Post Procedure Vitals Discharge Condition: Stable Temperature (F): 98.6 Ambulatory Status: Ambulatory Pulse (bpm): 98 Discharge Destination: Home Respiratory Rate (breaths/min): 20 Transportation: Private Auto Blood Pressure (mmHg): 161/97 Accompanied By: self Schedule Follow-up Appointment: Yes Clinical Summary of Care: Patient Declined Electronic Signature(s) Signed: 11/22/2022 4:13:58 PM By: Karie Schwalbe RN Entered By: Karie Schwalbe on 11/22/2022 06:27:02 -------------------------------------------------------------------------------- Lower Extremity Assessment Details Patient Name: Date of Service: [name] 11/22/2022 8:15 A M Medical Record Number: [id number] Patient Account Number: [id number] Date of Birth/Sex: Treating RN: 09-12-1985 (37 y.o. [name] Primary Care Topanga Alvelo: [name] Other Clinician: Referring Keiandre Cygan: Treating Hattie Pine/Extender: [name] in Treatment: 160 Edema Assessment Assessed: [Left: No] [Right: No] Edema: [Left: Yes] [Right: Yes] Calf Left: Right: Point of Measurement: 48 cm From Medial Instep 48.1 cm 48.5 cm Ankle Left: Right: Point of Measurement: 11 cm From Medial Instep 29 cm 29.2  cm [place], [name] ([id number]) [id number] Page 4 of 12 Vascular Assessment Pulses: Dorsalis Pedis Palpable: [Left:Yes] [Right:Yes] Extremity colors, hair growth, and conditions: Extremity Color: [Left:Normal] [Right:Normal] Hair Growth on Extremity: [Left:Yes] [Right:Yes] Temperature of Extremity: [Left:Warm < 3 seconds] [Right:Warm < 3 seconds] Electronic Signature(s) Signed: 11/22/2022 4:13:58 PM By: Karie Schwalbe  RN Entered By: Karie Schwalbe on 11/22/2022 05:14:22 -------------------------------------------------------------------------------- Multi Wound Chart Details Patient Name: Date of Service: [name] 11/22/2022 8:15 A M Medical Record Number: [id number] Patient Account Number: [id number] Date of Birth/Sex: Treating RN: 1985-06-29 (37 y.o. M) Primary Care Leon Goodnow: [name] Other Clinician: Referring Veera Stapleton: Treating Walta [place]/Extender: [name] in Treatment: 160 Vital Signs Height(in): 77 Capillary Blood Glucose(mg/dl): 409 Weight(lbs): 811 Pulse(bpm): 98 Body Mass Index(BMI): 33.2 Blood Pressure(mmHg): 161/97 Temperature(F): 98.6 Respiratory Rate(breaths/min): 20 [15:Photos:] Right, Dorsal Ankle Right, Medial Foot Left, Lateral, Plantar Foot Wound Location: Gradually Appeared Gradually Appeared Trauma Wounding Event: Diabetic Wound/Ulcer of the Lower Diabetic Wound/Ulcer of the Lower Diabetic Wound/Ulcer of the Lower Primary Etiology: Extremity Extremity Extremity Type II Diabetes Type II Diabetes Type II Diabetes Comorbid History: 09/13/2022 10/04/2022 10/02/2019 Date Acquired: 7 7 160 Weeks of Treatment: Open Open Open Wound Status: No No No Wound Recurrence: 1.8x2.4x1.1 0.1x0.1x0.1 3.4x2.8x1.2 Measurements L x W x [name] 3.393 0.008 7.477 A (cm) : rea 3.732 0.001 8.972 Volume (cm) : -63.70% 97.50% -353.40% % Reduction in A rea: -259.90% 96.80% -5337.60% % Reduction in  Volume: 6 Position 1 (o'clock): 0.4 Maximum Distance 1 (cm): Yes No No Tunneling: Grade 2 Grade 2 Grade 3 Classification: Medium Small Large Exudate A mount: Serosanguineous Serosanguineous Serosanguineous Exudate Type: red, brown red, brown red, brown Exudate Color: Distinct, outline attached Distinct, outline attached N/A Wound Margin: Medium (34-66%) None Present (0%) Large (67-100%) Granulation A mount: Red, Pink N/A Red Granulation Quality: Medium (34-66%) Large (67-100%) Small (1-33%) Necrotic A mount: [place], [name] ([id number]) [id number] Page 5 of 12 Adherent [facility] Eschar Adherent [employer] Necrotic Tissue: Fat Layer (Subcutaneous Tissue): Yes Fat Layer (Subcutaneous Tissue): Yes Fat Layer (Subcutaneous Tissue): Yes Exposed Structures: Tendon: Yes Fascia: No Muscle: Yes Tendon: No Muscle: No Joint: No Bone: No None None Medium (34-66%) Epithelialization: Debridement - Selective/Open Wound Debridement - Selective/Open Wound Debridement - Selective/Open Wound Debridement: Pre-procedure Verification/Time Out 08:30 08:30 08:30 Taken: Lidocaine 4% Topical Solution Lidocaine 4% Topical Solution Lidocaine 4% Topical Solution Pain Control: Ambulance person, Callus, Slough Callus Tissue Debrided: Non-Viable Tissue Non-Viable Tissue Non-Viable Tissue Level: 3.39 0.01 7.47 Debridement A (sq cm): rea Curette Curette Curette Instrument: Minimum Minimum Minimum Bleeding: Pressure Pressure Pressure Hemostasis A chieved: 0 0 0 Procedural Pain: 0 0 0 Post Procedural Pain: Procedure was tolerated well Procedure was tolerated well Procedure was tolerated well Debridement Treatment Response: 1.8x2.4x1.1 0.1x0.1x0.1 3.4x2.8x1.2 Post Debridement Measurements L x W x [name] 3.732 0.001 8.972 Post Debridement Volume: (cm) Scarring: Yes No Abnormalities Noted Scarring: Yes Periwound Skin Texture: Maceration: Yes No Abnormalities  Noted No Abnormalities Noted Periwound Skin Moisture: Dry/Scaly: No Hemosiderin Staining: Yes No Abnormalities Noted No Abnormalities Noted Periwound Skin Color: No Abnormality No Abnormality No Abnormality Temperature: Debridement Debridement Debridement Procedures Performed: Treatment Notes Electronic Signature(s) Signed: 11/22/2022 9:17:49 AM By: Duanne Guess MD FACS Entered By: Duanne Guess on 11/22/2022 06:17:49 -------------------------------------------------------------------------------- Multi-Disciplinary Care Plan Details Patient Name: Date of Service: [name], [name] 11/22/2022 8:15 A M Medical Record Number: [id number] Patient Account Number: [id number] Date of Birth/Sex: Treating RN: Oct 21, 1985 (37 y.o. [name] Primary Care Mishawn Didion: [name] Other Clinician: Referring Shonn Farruggia: Treating Cayde Held/Extender: [name] in Treatment: 160 Multidisciplinary Care Plan reviewed with physician Active Inactive Electronic Signature(s) Signed: 11/28/2022 3:46:49 PM By: Karie Schwalbe RN Signed: 11/28/2022 5:14:12 PM By: Zenaida Deed RN, BSN Previous Signature: 11/22/2022 4:13:58 PM Version By: Karie Schwalbe RN Entered  By: Zenaida Deed on 11/28/2022 08:53:41 -------------------------------------------------------------------------------- Pain Assessment Details Patient Name: Date of Service: [name] 11/22/2022 8:15 A M Medical Record Number: [id number] Patient Account Number: [id number] [place], [name] ([id number]) [id number] Page 6 of 12 Date of Birth/Sex: Treating RN: [birth year] (37 y.o. M) Primary Care Hayden Mabin: Other Clinician: [name] Referring Khamari Yousuf: Treating Arthea Nobel/Extender: [name] in Treatment: 160 Active Problems Location of Pain Severity and Description of Pain Patient Has Paino No Site Locations Pain Management and  Medication Current Pain Management: Electronic Signature(s) Signed: 11/22/2022 8:17:59 AM By: Dayton Scrape Entered By: Dayton Scrape on 11/22/2022 04:56:21 -------------------------------------------------------------------------------- Patient/Caregiver Education Details Patient Name: Date of Service: [name], [name] 8/20/2024andnbsp8:15 A M Medical Record Number: [id number] Patient Account Number: [id number] Date of Birth/Gender: Treating RN: 11-25-1985 (37 y.o. [name] Primary Care Physician: [name] Other Clinician: Referring Physician: Treating Physician/Extender: [name] in Treatment: 160 Education Assessment Education Provided To: Patient Education Topics Provided Wound/Skin Impairment: Methods: Demonstration Responses: State content correctly Electronic Signature(s) Signed: 11/22/2022 4:13:58 PM By: Karie Schwalbe RN Entered By: Karie Schwalbe on 11/22/2022 06:23:26 [place], [name] ([id number]) [id number] Page 7 of 12 -------------------------------------------------------------------------------- Wound Assessment Details Patient Name: Date of Service: [name] 11/22/2022 8:15 A M Medical Record Number: [id number] Patient Account Number: [id number] Date of Birth/Sex: Treating RN: 25-Nov-1985 (37 y.o. M) Primary Care Kaytlyn Din: [name] Other Clinician: Referring Careen Mauch: Treating Jazman Reuter/Extender: [name] in Treatment: 160 Wound Status Wound Number: 15 Primary Etiology: Diabetic Wound/Ulcer of the Lower Extremity Wound Location: Right, Dorsal Ankle Wound Status: Open Wounding Event: Gradually Appeared Comorbid History: Type II Diabetes Date Acquired: 09/13/2022 Weeks Of Treatment: 7 Clustered Wound: No Photos Wound Measurements Length: (cm) 1.8 Width: (cm) 2.4 Depth: (cm) 1.1 Area: (cm) 3.393 Volume: (cm) 3.732 % Reduction in Area:  -63.7% % Reduction in Volume: -259.9% Epithelialization: None Tunneling: Yes Position (o'clock): 6 Maximum Distance: (cm) 0.4 Undermining: No Wound Description Classification: Grade 2 Wound Margin: Distinct, outline attached Exudate Amount: Medium Exudate Type: Serosanguineous Exudate Color: red, brown Foul Odor After Cleansing: No Slough/Fibrino Yes Wound Bed Granulation Amount: Medium (34-66%) Exposed Structure Granulation Quality: Red, Pink Fat Layer (Subcutaneous Tissue) Exposed: Yes Necrotic Amount: Medium (34-66%) Tendon Exposed: Yes Necrotic Quality: Adherent Slough Muscle Exposed: Yes Necrosis of Muscle: No Periwound Skin Texture Texture Color No Abnormalities Noted: Yes No Abnormalities Noted: No Hemosiderin Staining: Yes Moisture No Abnormalities Noted: No Temperature / Pain Dry / Scaly: No Temperature: No Abnormality Maceration: Yes Treatment Notes Wound #15 (Ankle) Wound Laterality: Dorsal, Right Cleanser Soap and Water Discharge Instruction: May shower and wash wound with dial antibacterial soap and water prior to dressing change. [place], [name] ([id number]) [id number] Page 8 of 12 Vashe 5.8 (oz) Discharge Instruction: Cleanse the wound with Vashe prior to applying a clean dressing, let sit on wound for 5-10 minutesusing gauze sponges, not tissue or cotton balls. Peri-Wound Care Skin Prep Discharge Instruction: Use skin prep as directed Topical Skintegrity Hydrogel 4 (oz) Discharge Instruction: Apply hydrogel as directed Primary Dressing ADAPTIC TOUCH 3x4.25 (in/in) Discharge Instruction: Apply to wound bed over Hydrogel Secondary Dressing Woven Gauze Sponge, Non-Sterile 4x4 in Discharge Instruction: Apply over primary dressing as directed. Zetuvit Plus 4x8 in Discharge Instruction: Apply over primary dressing as directed. Zetuvit Absorbent Pad, 4x4 (in/in) Secured With Elastic Bandage 4 inch (ACE bandage) Discharge  Instruction: Secure with ACE bandage as directed. Kerlix Roll Sterile, 4.5x3.1 (in/yd) Discharge Instruction: Secure with  Kerlix as directed. 70M Medipore H Soft Cloth Surgical T ape, 4 x 10 (in/yd) Discharge Instruction: Secure with tape as directed. Compression Wrap Compression Stockings Add-Ons Electronic Signature(s) Signed: 11/22/2022 4:13:58 PM By: Karie Schwalbe RN Entered By: Karie Schwalbe on 11/22/2022 05:17:26 -------------------------------------------------------------------------------- Wound Assessment Details Patient Name: Date of Service: [name] 11/22/2022 8:15 A M Medical Record Number: [id number] Patient Account Number: [id number] Date of Birth/Sex: Treating RN: 1985/06/27 (37 y.o. M) Primary Care Valera Vallas: [name] Other Clinician: Referring Landis Cassaro: Treating Tajuana Kniskern/Extender: [name] in Treatment: 160 Wound Status Wound Number: 16 Primary Etiology: Diabetic Wound/Ulcer of the Lower Extremity Wound Location: Right, Medial Foot Wound Status: Open Wounding Event: Gradually Appeared Comorbid History: Type II Diabetes Date Acquired: 10/04/2022 Weeks Of Treatment: 7 Clustered Wound: No Photos [place], [name] ([id number]) [id number] Page 9 of 12 Wound Measurements Length: (cm) 0.1 Width: (cm) 0.1 Depth: (cm) 0.1 Area: (cm) 0.008 Volume: (cm) 0.001 % Reduction in Area: 97.5% % Reduction in Volume: 96.8% Epithelialization: None Tunneling: No Undermining: No Wound Description Classification: Grade 2 Wound Margin: Distinct, outline attached Exudate Amount: Small Exudate Type: Serosanguineous Exudate Color: red, brown Foul Odor After Cleansing: No Slough/Fibrino Yes Wound Bed Granulation Amount: None Present (0%) Exposed Structure Necrotic Amount: Large (67-100%) Fascia Exposed: No Necrotic Quality: Eschar Fat Layer (Subcutaneous Tissue) Exposed: Yes Tendon Exposed: No Muscle  Exposed: No Joint Exposed: No Bone Exposed: No Periwound Skin Texture Texture Color No Abnormalities Noted: Yes No Abnormalities Noted: Yes Moisture Temperature / Pain No Abnormalities Noted: Yes Temperature: No Abnormality Treatment Notes Wound #16 (Foot) Wound Laterality: Right, Medial Cleanser Soap and Water Discharge Instruction: May shower and wash wound with dial antibacterial soap and water prior to dressing change. Vashe 5.8 (oz) Discharge Instruction: Cleanse the wound with Vashe prior to applying a clean dressing, let sit on wound for 5-10 minutesusing gauze sponges, not tissue or cotton balls. Peri-Wound Care Skin Prep Discharge Instruction: Use skin prep as directed Topical Primary Dressing Maxorb Extra Ag+ Alginate Dressing, 2x2 (in/in) Discharge Instruction: Apply to wound bed as instructed Secondary Dressing Optifoam Non-Adhesive Dressing, 4x4 in Discharge Instruction: Apply over primary dressing as directed. Woven Gauze Sponge, Non-Sterile 4x4 in Discharge Instruction: Apply over primary dressing as directed. Secured With Elastic Bandage 4 inch (ACE bandage) Discharge Instruction: Secure with ACE bandage as directed. [place], [name] ([id number]) [id number] Page 10 of 12 Kerlix Roll Sterile, 4.5x3.1 (in/yd) Discharge Instruction: Secure with Kerlix as directed. 70M Medipore H Soft Cloth Surgical T ape, 4 x 10 (in/yd) Discharge Instruction: Secure with tape as directed. Compression Wrap Compression Stockings Add-Ons Electronic Signature(s) Signed: 11/22/2022 4:13:58 PM By: Karie Schwalbe RN Previous Signature: 11/22/2022 8:17:59 AM Version By: Dayton Scrape Entered By: Karie Schwalbe on 11/22/2022 05:18:56 -------------------------------------------------------------------------------- Wound Assessment Details Patient Name: Date of Service: [name] 11/22/2022 8:15 A M Medical Record Number: [id number] Patient Account  Number: [id number] Date of Birth/Sex: Treating RN: 23-Jun-1985 (37 y.o. M) Primary Care Eri Platten: [name] Other Clinician: Referring Yoshiharu Brassell: Treating Raeden Belzer/Extender: [name] in Treatment: 160 Wound Status Wound Number: 3 Primary Etiology: Diabetic Wound/Ulcer of the Lower Extremity Wound Location: Left, Lateral, Plantar Foot Wound Status: Open Wounding Event: Trauma Comorbid History: Type II Diabetes Date Acquired: 10/02/2019 Weeks Of Treatment: 160 Clustered Wound: No Photos Wound Measurements Length: (cm) Width: (cm) Depth: (cm) Area: (cm) Volume: (cm) 3.4 % Reduction in Area: -353.4% 2.8 % Reduction in Volume: -5337.6% 1.2 Epithelialization: Medium (34-66%) 7.477 Tunneling: No  8.972 Undermining: No Wound Description Classification: Grade 3 Exudate Amount: Large Exudate Type: Serosanguineous Exudate Color: red, brown Foul Odor After Cleansing: No Slough/Fibrino Yes Wound Bed Granulation Amount: Large (67-100%) Exposed Structure Granulation Quality: Red Fat Layer (Subcutaneous Tissue) Exposed: Yes Necrotic Amount: Small (1-33%) Necrotic Quality: [address] [place], [name] ([id number]) [id number] Page 11 of 12 Periwound Skin Texture Texture Color No Abnormalities Noted: No No Abnormalities Noted: Yes Scarring: Yes Temperature / Pain Temperature: No Abnormality Moisture No Abnormalities Noted: Yes Treatment Notes Wound #3 (Foot) Wound Laterality: Plantar, Left, Lateral Cleanser Soap and Water Discharge Instruction: May shower and wash wound with dial antibacterial soap and water prior to dressing change. Vashe 5.8 (oz) Discharge Instruction: Cleanse the wound with Vashe prior to applying a clean dressing, let sit on wound for 5-10 minutesusing gauze sponges, not tissue or cotton balls. Peri-Wound Care Skin Prep Discharge Instruction: Use skin prep as directed Topical Skintegrity  Hydrogel 4 (oz) Discharge Instruction: Apply hydrogel as directed Primary Dressing ADAPTIC TOUCH 3x4.25 (in/in) Discharge Instruction: Apply to wound bed over Hydrogel Secondary Dressing Woven Gauze Sponge, Non-Sterile 4x4 in Discharge Instruction: Apply over primary dressing as directed. Zetuvit Plus 4x8 in Discharge Instruction: Apply over primary dressing as directed. Zetuvit Absorbent Pad, 4x4 (in/in) Secured With Elastic Bandage 4 inch (ACE bandage) Discharge Instruction: Secure with ACE bandage as directed. Kerlix Roll Sterile, 4.5x3.1 (in/yd) Discharge Instruction: Secure with Kerlix as directed. 38M Medipore H Soft Cloth Surgical T ape, 4 x 10 (in/yd) Discharge Instruction: Secure with tape as directed. Compression Wrap Compression Stockings Add-Ons Electronic Signature(s) Signed: 11/22/2022 4:13:58 PM By: Karie Schwalbe RN Previous Signature: 11/22/2022 8:17:59 AM Version By: Dayton Scrape Entered By: Karie Schwalbe on 11/22/2022 05:21:41 -------------------------------------------------------------------------------- Vitals Details Patient Name: Date of Service: [name], [name] 11/22/2022 8:15 A M Medical Record Number: [id number] Patient Account Number: [id number] Date of Birth/Sex: Treating RN: 1985/04/05 (37 y.o. M) Primary Care Hamzeh Tall: [name] Other Clinician: Referring Yasemin Rabon: Treating Joanna Hall/Extender: [name] in Treatment: [address], [name] ([id number]) [id number] Page 12 of 12 Vital Signs Time Taken: 07:55 Temperature (F): 98.6 Height (in): 77 Pulse (bpm): 98 Weight (lbs): 280 Respiratory Rate (breaths/min): 20 Body Mass Index (BMI): 33.2 Blood Pressure (mmHg): 161/97 Capillary Blood Glucose (mg/dl): 440 Reference Range: 80 - 120 mg / dl Electronic Signature(s) Signed: 11/22/2022 8:17:59 AM By: Dayton Scrape Entered By: Dayton Scrape on 11/22/2022 04:56:10

## 2022-11-22 NOTE — Progress Notes (Signed)
[place], [name] ([id number]) [id number] Page 1 of 1 Visit Report for 11/22/2022 SuperBill Details Patient Name: Date of Service: [name] 11/22/2022 Medical Record Number: [id number] Patient Account Number: [id number] Date of Birth/Sex: Treating RN: Nov 29, 1985 (37 y.o. 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 E11.622 Type 2 diabetes mellitus with other skin ulcer Facility Procedures CPT4 Code Description Modifier Quantity [id number] G0277-(Facility Use Only) HBOT full body chamber, , 4 ICD-10 Diagnosis Description 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 M86.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 acute osteomyelitis, left ankle and foot E11.622 Type 2 diabetes mellitus with other skin ulcer Physician Procedures Quantity CPT4 Code Description Modifier [id number] - WC PHYS HYPERBARIC OXYGEN THERAPY 1 ICD-10 Diagnosis Description 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

## 2022-11-22 NOTE — Progress Notes (Addendum)
[place], [name] ([id number]) [id number] Page 1 of 2 Visit Report for 11/22/2022 Arrival Information Details Patient Name: Date of Service: [name] 11/22/2022 10:00 A M Medical Record Number: [id number] Patient Account Number: [id number] Date of Birth/Sex: Treating RN: 1985/04/23 (37 y.o. [name], [name] Primary Care Nylah Butkus: [name] Other Clinician: Haywood Pao Referring Bryon Parker: Treating Esperansa Sarabia/Extender: [name] in Treatment: 160 Visit Information History Since Last Visit All ordered tests and consults were completed: Yes Patient Arrived: [name] Added or deleted any medications: No Arrival Time: 09:15 Any new allergies or adverse reactions: No Accompanied By: self Had a fall or experienced change in No Transfer Assistance: None activities of daily living that may affect Patient Identification Verified: Yes risk of falls: Secondary Verification Process Completed: Yes Signs or symptoms of abuse/neglect since last visito No Patient Requires Transmission-Based Precautions: No Hospitalized since last visit: No Patient Has Alerts: No Implantable device outside of the clinic excluding No cellular tissue based products placed in the center since last visit: Pain Present Now: No Electronic Signature(s) Signed: 11/22/2022 2:05:48 PM By: Haywood Pao CHT EMT BS , , Entered By: Haywood Pao on 11/22/2022 11:05:48 -------------------------------------------------------------------------------- Encounter Discharge Information Details Patient Name: Date of Service: [name], [name] 11/22/2022 10:00 A M Medical Record Number: [id number] Patient Account Number: [id number] Date of Birth/Sex: Treating RN: 1985/10/15 (37 y.o. [name] Primary Care Jermaine Neuharth: [name] Other Clinician: Haywood Pao Referring Chantele Corado: Treating Garon Melander/Extender: [name] in Treatment: 160 Encounter Discharge Information Items Discharge Condition: Stable Ambulatory Status: Walker Discharge Destination: Home Transportation: Private Auto Accompanied By: self Schedule Follow-up Appointment: No Clinical Summary of Care: Electronic Signature(s) Signed: 11/22/2022 2:04:51 PM By: Haywood Pao CHT EMT BS , , Entered By: Haywood Pao on 11/22/2022 11:04:50 [place], [name] ([id number]) [id number] Page 2 of 2 -------------------------------------------------------------------------------- Vitals Details Patient Name: Date of Service: [name] 11/22/2022 10:00 A M Medical Record Number: [id number] Patient Account Number: [id number] Date of Birth/Sex: Treating RN: Oct 31, 1985 (37 y.o. [name] Primary Care Annamarie Yamaguchi: [name] Other Clinician: Haywood Pao Referring Karanvir Balderston: Treating Gracemarie Skeet/Extender: [name] in Treatment: 160 Vital Signs Time Taken: 09:46 Temperature (F): 98.2 Height (in): 77 Pulse (bpm): 87 Weight (lbs): 280 Respiratory Rate (breaths/min): 18 Body Mass Index (BMI): 33.2 Blood Pressure (mmHg): 135/96 Capillary Blood Glucose (mg/dl): 010 Reference Range: 80 - 120 mg / dl Electronic Signature(s) Signed: 11/22/2022 12:48:42 PM By: Haywood Pao CHT EMT BS , , Entered By: Haywood Pao on 11/22/2022 09:48:41

## 2022-11-22 NOTE — Progress Notes (Addendum)
[place], [name] ([id number]) [id number] Page 1 of 3 Visit Report for 11/22/2022 HBO Details Patient Name: Date of Service: [name] 11/22/2022 10:00 A M Medical Record Number: [id number] Patient Account Number: [id number] Date of Birth/Sex: Treating RN: 07/09/1985 (37 y.o. [name] Primary Care Gizzelle Lacomb: [name] Other Clinician: Haywood Pao Referring Lillieanna Tuohy: Treating Santosh Petter/Extender: [name] in Treatment: 160 HBO Treatment Course Details Treatment Course Number: 2 Ordering Breean Nannini: Duanne Guess T Treatments Ordered: otal 40 HBO Treatment Start Date: 10/04/2022 HBO Indication: Diabetic Ulcer(s) of the Lower Extremity Wound #3 Left, Lateral, Plantar Foot HBO Treatment Details Treatment Number: 18 Patient Type: Outpatient Chamber Type: Monoplace Chamber Serial #: [id number] Treatment Protocol: 2.0 ATA with 90 minutes oxygen, and no air breaks Treatment Details Compression Rate Down: 1.5 psi / minute De-Compression Rate Up: 2.0 psi / minute Air breaks and breathing Decompress Decompress Compress Tx Pressure Begins Reached periods Begins Ends (leave unused spaces blank) Chamber Pressure (ATA 1 2 ------2 1 ) Clock Time (24 hr) 11:04 11:11 - - - - - - 12:41 12:50 Treatment Length: 106 (minutes) Treatment Segments: 4 Vital Signs Capillary Blood Glucose Reference Range: 80 - 120 mg / dl HBO Diabetic Blood Glucose Intervention Range: <131 mg/dl or >474 mg/dl Time Capillary Blood Glucose Pulse Blood Respiratory Action Type: Vitals Pulse: Temperature: Glucose Oximetry Pressure: Rate: Meter #: Taken: Taken: (mg/dl): (%) Pre 25:95 638/75 87 18 98.2 122 given 2 apple juice Pre 10:18 110 Given 8 oz Glucerna and 2 PB cups with grhm crkrs Pre 10:55 156 proceed with HBOT Post 12:58 182/122 80 18 98.2 133 (manual BP) re-measured BP Post 13:03 172/120 (manual BP) Treatment  Response Treatment Toleration: Well Treatment Completion Status: Treatment Completed without Adverse Event Treatment Notes Mr. Winnett arrived with normal vitals except blood glucose level at 122 mg/dL. Patient stated that he had just finished a muscle milk protein drink and last administered insulin yesterday evening (60 units). He was given 8 oz Apple Juice to complement the protein shake he stated that he consumed. At 1018 CBG was re-measured at 110 mg/dL. Patient was given 8 oz Glucerna, 2 peanut butter cups, and two graham crackers. At 1055, patient's CBG was re-measured with result of 156 mg/dL. He had prepared for treatment in the meanwhile, and a safety check was performed. Upon completion of safety check, patient was placed in the chamber which was compressed at a rate of 2 psi/min confirming normal ear equalization. He tolerated the treatment and subsequent decompression of the chamber at the rate of 2 psi/min. His post-treatment vital signs showed high blood pressure again 182/122 manual upon exiting the chamber and 172/120 mmHg at 1303. Blood glucose level was 133 mg/dL. He stated that he felt fine. Educated patient that he doesn't need to maintain blood pressures at that level. He was stable upon discharge. Additional Procedure Documentation Tissue Sevierity: Necrosis of bone [place], [name] ([id number]) [id number] Page 2 of 3 Physician HBO Attestation: I certify that I supervised this HBO treatment in accordance with Medicare guidelines. A trained emergency response team is readily available per Yes hospital policies and procedures. Continue HBOT as ordered. Yes Electronic Signature(s) Signed: 11/22/2022 4:17:49 PM By: Duanne Guess MD FACS Previous Signature: 11/22/2022 2:02:46 PM Version By: Haywood Pao CHT EMT BS , , Previous Signature: 11/22/2022 2:02:11 PM Version By: Haywood Pao CHT EMT BS , , Previous Signature: 11/22/2022 1:48:34 PM  Version By: Haywood Pao CHT EMT BS , , Entered By: Duanne Guess  on 11/22/2022 13:17:49 -------------------------------------------------------------------------------- HBO Safety Checklist Details Patient Name: Date of Service: [name] 11/22/2022 10:00 A M Medical Record Number: [id number] Patient Account Number: [id number] Date of Birth/Sex: Treating RN: 05/06/1985 (37 y.o. [name] Primary Care Eljay Lave: [name] Other Clinician: Haywood Pao Referring Ardis Fullwood: Treating Abdulla Pooley/Extender: [name] in Treatment: 160 HBO Safety Checklist Items Safety Checklist Consent Form Signed Patient voided / foley secured and emptied When did you last eato 0840 - Muscle Milk Protein Shake Last dose of injectable or oral agent Yesterday - Lantus 60 Units Ostomy pouch emptied and vented if applicable NA All implantable devices assessed, documented and approved Freestyle Libre 2 and3 on approved list Intravenous access site secured and place NA Valuables secured Linens and cotton and cotton/polyester blend (less than 51% polyester) Personal oil-based products / skin lotions / body lotions removed Wigs or hairpieces removed NA Smoking or tobacco materials removed NA Books / newspapers / magazines / loose paper removed Cologne, aftershave, perfume and deodorant removed Jewelry removed (may wrap wedding band) Make-up removed NA Hair care products removed Battery operated devices (external) removed Heating patches and chemical warmers removed Titanium eyewear removed Nail polish cured greater than 10 hours Casting material cured greater than 10 hours Hearing aids removed Loose dentures or partials removed Prosthetics have been removed Patient demonstrates correct use of air break device (if applicable) NA Patient concerns have been addressed NA Patient grounding bracelet on and cord attached to  chamber NA Specifics for Inpatients (complete in addition to above) Medication sheet sent with patient NA Intravenous medications needed or due during therapy sent with patient NA Drainage tubes (e.g. nasogastric tube or chest tube secured and vented) NA Endotracheal or Tracheotomy tube secured NA Cuff deflated of air and inflated with saline NA Airway suctioned NA Notes [place], [name] ([id number]) [id number] Page 3 of 3 Paper version used prior to treatment start. Electronic Signature(s) Signed: 11/22/2022 1:35:30 PM By: Haywood Pao CHT EMT BS , , Entered By: Haywood Pao on 11/22/2022 10:35:30

## 2022-11-23 ENCOUNTER — Encounter (HOSPITAL_BASED_OUTPATIENT_CLINIC_OR_DEPARTMENT_OTHER): Payer: BC Managed Care – PPO | Admitting: General Surgery

## 2022-11-23 DIAGNOSIS — E11622 Type 2 diabetes mellitus with other skin ulcer: Secondary | ICD-10-CM | POA: Diagnosis not present

## 2022-11-23 LAB — GLUCOSE, CAPILLARY
Glucose-Capillary: 182 mg/dL — ABNORMAL HIGH (ref 70–99)
Glucose-Capillary: 144 mg/dL — ABNORMAL HIGH (ref 70–99)

## 2022-11-23 NOTE — Progress Notes (Addendum)
[place], [name] ([id number]) [id number] Page 1 of 2 Visit Report for 11/23/2022 HBO Details Patient Name: Date of Service: [name] 11/23/2022 10:00 A M Medical Record Number: [id number] Patient Account Number: [id number] Date of Birth/Sex: Treating RN: 11/11/1985 (37 y.o. [name] Primary Care Jamiyla Ishee: [name] Other Clinician: [name] Referring Rumaisa Schnetzer: Treating Hidaya Daniel/Extender: [name] in Treatment: 160 HBO Treatment Course Details Treatment Course Number: 2 Ordering Atzel Mccambridge: Duanne Guess T Treatments Ordered: otal 40 HBO Treatment Start Date: 10/04/2022 HBO Indication: Diabetic Ulcer(s) of the Lower Extremity Wound #3 Left, Lateral, Plantar Foot HBO Treatment Details Treatment Number: 19 Patient Type: Outpatient Chamber Type: Monoplace Chamber Serial #: [id number] Treatment Protocol: 2.0 ATA with 90 minutes oxygen, and no air breaks Treatment Details Compression Rate Down: 2.0 psi / minute De-Compression Rate Up: Air breaks and breathing Decompress Decompress Compress Tx Pressure Begins Reached periods Begins Ends (leave unused spaces blank) Chamber Pressure (ATA 1 2 ------2 1 ) Clock Time (24 hr) 10:37 10:48 - - - - - - 12:18 12:24 Treatment Length: 107 (minutes) Treatment Segments: 4 Vital Signs Capillary Blood Glucose Reference Range: 80 - 120 mg / dl HBO Diabetic Blood Glucose Intervention Range: <131 mg/dl or >130 mg/dl Time Vitals Blood Respiratory Capillary Blood Glucose Pulse Action Type: Pulse: Temperature: Taken: Pressure: Rate: Glucose (mg/dl): Meter #: Oximetry (%) Taken: Pre 09:50 102/86 88 18 98.2 182 Pre 10:29 116/73 83 Post 12:30 164/110 84 18 98.4 144 Treatment Response Treatment Toleration: Well Treatment Completion Status: Treatment Completed without Adverse Event Treatment Notes Dr. Lady Gary informed of patient post treatment blood  pressure. Additional Procedure Documentation Tissue Sevierity: Necrosis of bone Physician HBO Attestation: I certify that I supervised this HBO treatment in accordance with Medicare guidelines. A trained emergency response team is readily available per Yes hospital policies and procedures. Continue HBOT as ordered. Yes Electronic Signature(s) Signed: 11/23/2022 1:09:45 PM By: Duanne Guess MD FACS [place], [name] [phone] By: Duanne Guess MD FACS [phone].pdf Page 2 of 2 Signed: 11/23/2022 1:09:45 Previous Signature: 11/23/2022 12:42:43 PM Version By: [name] EMT Previous Signature: 11/23/2022 12:41:14 PM Version By: [name] EMT Entered By: Duanne Guess on 11/23/2022 10:09:45 -------------------------------------------------------------------------------- HBO Safety Checklist Details Patient Name: Date of Service: [name], [name] 11/23/2022 10:00 A M Medical Record Number: [id number] Patient Account Number: [id number] Date of Birth/Sex: Treating RN: [birth year] (37 y.o. [name] Primary Care Erendira Crabtree: [name] Other Clinician: [name] Referring Morganna Styles: Treating Aeralyn Barna/Extender: [name] in Treatment: 160 HBO Safety Checklist Items Safety Checklist Consent Form Signed Patient voided / foley secured and emptied When did you last eato 0630 Last dose of injectable or oral agent Yesterday 14 units Lantus Ostomy pouch emptied and vented if applicable NA All implantable devices assessed, documented and approved NA Intravenous access site secured and place NA Valuables secured Linens and cotton and cotton/polyester blend (less than 51% polyester) Personal oil-based products / skin lotions / body lotions removed Wigs or hairpieces removed NA Smoking or tobacco materials removed Books / newspapers / magazines / loose paper removed Cologne, aftershave, perfume and deodorant  removed Jewelry removed (may wrap wedding band) Make-up removed NA Hair care products removed Battery operated devices (external) removed Libre Heating patches and chemical warmers removed Titanium eyewear removed NA Nail polish cured greater than 10 hours NA Casting material cured greater than 10 hours NA Hearing aids removed NA Loose dentures or partials removed NA Prosthetics have been removed NA  Patient demonstrates correct use of air break device (if applicable) Patient concerns have been addressed Patient grounding bracelet on and cord attached to chamber Specifics for Inpatients (complete in addition to above) Medication sheet sent with patient NA Intravenous medications needed or due during therapy sent with patient NA Drainage tubes (e.g. nasogastric tube or chest tube secured and vented) NA Endotracheal or Tracheotomy tube secured NA Cuff deflated of air and inflated with saline NA Airway suctioned NA Notes The safety checklist was done before the treatment was started. Electronic Signature(s) Signed: 11/23/2022 11:23:16 AM By: [name] EMT Entered By: [name] on 11/23/2022 08:23:16

## 2022-11-23 NOTE — Progress Notes (Addendum)
[place], [name] ([id number]) [id number] Page 1 of 2 Visit Report for 11/23/2022 Arrival Information Details Patient Name: Date of Service: [name] 11/23/2022 10:00 A M Medical Record Number: [id number] Patient Account Number: [id number] Date of Birth/Sex: Treating RN: September 06, 1985 (37 y.o. [name] Primary Care Surya Folden: [name] Other Clinician: [name] Referring Berda Shelvin: Treating Ariatna Jester/Extender: [name] in Treatment: 160 Visit Information History Since Last Visit All ordered tests and consults were completed: Yes Patient Arrived: Ambulatory Added or deleted any medications: No Arrival Time: 09:44 Any new allergies or adverse reactions: No Accompanied By: None Had a fall or experienced change in No Transfer Assistance: None activities of daily living that may affect Patient Identification Verified: Yes risk of falls: Secondary Verification Process Completed: Yes Signs or symptoms of abuse/neglect since last visito No Patient Requires Transmission-Based Precautions: No Hospitalized since last visit: No Patient Has Alerts: No Implantable device outside of the clinic excluding No cellular tissue based products placed in the center since last visit: Pain Present Now: No Electronic Signature(s) Signed: 11/23/2022 11:16:07 AM By: [name] EMT Entered By: [name] on 11/23/2022 08:16:07 -------------------------------------------------------------------------------- Encounter Discharge Information Details Patient Name: Date of Service: [name], [name] 11/23/2022 10:00 A M Medical Record Number: [id number] Patient Account Number: [id number] Date of Birth/Sex: Treating RN: 01-28-1986 (37 y.o. [name] Primary Care Illona Bulman: [name] Other Clinician: [name] Referring Kaily Wragg: Treating Abdur Hoglund/Extender: [name] in Treatment:  160 Encounter Discharge Information Items Discharge Condition: Stable Ambulatory Status: Walker Discharge Destination: Home Transportation: Private Auto Accompanied By: None Schedule Follow-up Appointment: Yes Clinical Summary of Care: Electronic Signature(s) Signed: 11/23/2022 12:44:05 PM By: [name] EMT Entered By: [name] on 11/23/2022 09:44:05 [name], [name] ([id number]) [id number] Page 2 of 2 -------------------------------------------------------------------------------- Vitals Details Patient Name: Date of Service: [name] 11/23/2022 10:00 A M Medical Record Number: [id number] Patient Account Number: [id number] Date of Birth/Sex: Treating RN: Apr 27, 1985 (37 y.o. [name] Primary Care Kameka Whan: [name] Other Clinician: [name] Referring Jashae Wiggs: Treating Arwilda [place]/Extender: [name] in Treatment: 160 Vital Signs Time Taken: 09:50 Temperature (F): 98.2 Height (in): 77 Pulse (bpm): 88 Weight (lbs): 280 Respiratory Rate (breaths/min): 18 Body Mass Index (BMI): 33.2 Blood Pressure (mmHg): 102/86 Capillary Blood Glucose (mg/dl): 235 Reference Range: 80 - 120 mg / dl Electronic Signature(s) Signed: 11/23/2022 11:21:06 AM By: [name] EMT Entered By: [name] on 11/23/2022 08:21:06

## 2022-11-23 NOTE — Progress Notes (Signed)
[place], [name] ([id number]) [id number] Page 1 of 3 Visit Report for 11/23/2022 Problem List Details Patient Name: Date of Service: [name] D 11/23/2022 10:00 A M Medical Record Number: [id number] Patient Account Number: [id number] Date of Birth/Sex: Treating RN: 22-Oct-1985 (37 y.o. 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 09/28/2022 No Yes without evidence of necrosis L97.512 Non-pressure chronic ulcer of other part of right foot with fat layer 10/04/2022 No Yes exposed M86.172 Other acute osteomyelitis, left ankle and foot 09/14/2022 No Yes E11.621 Type 2 diabetes mellitus with foot ulcer 10/24/2019 No Yes E11.622 Type 2 diabetes mellitus with other skin ulcer 09/28/2022 No Yes Inactive Problems ICD-10 Code Description Active Date Inactive Date L97.518 Non-pressure chronic ulcer of other part of right foot with other specified 07/14/2020 07/14/2020 severity L97.518 Non-pressure chronic ulcer of other part of right foot with other specified 10/24/2019 10/24/2019 severity 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, right ankle and foot 10/24/2019 10/24/2019 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 08/10/2020 08/10/2020 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, left ankle and foot 10/24/2019 10/24/2019 L97.322 Non-pressure chronic ulcer of left ankle with fat layer exposed 09/29/2021 09/29/2021 [place], [name] ([id number]) [id number] Page 2 of 3 [phone] Non-pressure chronic ulcer of other part of left  foot with fat layer exposed 09/07/2022 09/07/2022 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.62 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 10/24/2019 10/24/2019 classified elsewhere Resolved Problems ICD-10 Code Description Active Date Resolved Date L89.892 Pressure ulcer of other site, stage 2 07/27/2022 07/27/2022 Electronic Signature(s) Signed: 11/23/2022 12:43:29 PM By: [name] EMT Signed: 11/23/2022 1:07:25 PM By: Max Guess MD FACS Entered By: [name] on 11/23/2022 09:43:29 -------------------------------------------------------------------------------- SuperBill Details Patient Name: Date of Service: [name], [name] 11/23/2022 Medical Record Number: [id number] Patient Account Number: [id number] Date of Birth/Sex: Treating RN: [birth year] (37 y.o. 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 without evidence of necrosis L97.512 Non-pressure chronic ulcer of other part of right foot with fat layer exposed M86.172 Other acute osteomyelitis, left ankle and foot E11.621 Type 2 diabetes mellitus with foot ulcer E11.622 Type 2 diabetes mellitus with other skin ulcer Facility Procedures : CPT4 Code: [id number] Description: G0277-(Facility Use Only) HBOT full body chamber, , ICD-10 Diagnosis Description 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 M86.172 Other acute  osteomyelitis, left ankle and foot E11.622 Type 2 diabetes mellitus with other skin ulcer Modifier: one Quantity: 4 Physician Procedures : CPT4 Code Description Modifier  [id number] - WC PHYS HYPERBARIC OXYGEN THERAPY ICD-10 Diagnosis Description 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

## 2022-11-24 ENCOUNTER — Encounter (HOSPITAL_BASED_OUTPATIENT_CLINIC_OR_DEPARTMENT_OTHER): Payer: BC Managed Care – PPO | Admitting: General Surgery

## 2022-11-24 DIAGNOSIS — E11622 Type 2 diabetes mellitus with other skin ulcer: Secondary | ICD-10-CM | POA: Diagnosis not present

## 2022-11-24 LAB — GLUCOSE, CAPILLARY
Glucose-Capillary: 176 mg/dL — ABNORMAL HIGH (ref 70–99)
Glucose-Capillary: 172 mg/dL — ABNORMAL HIGH (ref 70–99)

## 2022-11-24 NOTE — Progress Notes (Addendum)
[place], [name] ([id number]) [id number] Page 1 of 1 Visit Report for 11/24/2022 Arrival Information Details Patient Name: Date of Service: [name] 11/24/2022 7:30 A M Medical Record Number: [id number] Patient Account Number: [id number] Date of Birth/Sex: Treating RN: 1985-05-11 (37 y.o. [name], [name] Primary Care [name]: [name] Other Clinician: [name] Referring [name]: Treating [name]/Extender: [name] in Treatment: 161 Visit Information History Since Last Visit All ordered tests and consults were completed: Yes Patient Arrived: [name] Added or deleted any medications: No Arrival Time: 07:14 Any new allergies or adverse reactions: No Accompanied By: self Had a fall or experienced change in No Transfer Assistance: None activities of daily living that may affect Patient Identification Verified: Yes risk of falls: Secondary Verification Process Completed: Yes Signs or symptoms of abuse/neglect since last visito No Patient Requires Transmission-Based Precautions: No Hospitalized since last visit: No Patient Has Alerts: No Implantable device outside of the clinic excluding No cellular tissue based products placed in the center since last visit: Pain Present Now: No Electronic Signature(s) Signed: 11/24/2022 9:41:07 AM By: Haywood Pao CHT EMT BS , , Previous Signature: 11/24/2022 9:34:20 AM Version By: Haywood Pao CHT EMT BS , , Entered By: Haywood Pao on 11/24/2022 09:41:07 -------------------------------------------------------------------------------- Vitals Details Patient Name: Date of Service: [name], [name] 11/24/2022 7:30 A M Medical Record Number: [id number] Patient Account Number: [id number] Date of Birth/Sex: Treating RN: Oct 24, 1985 (37 y.o. [name] Primary Care [name]: [name] Other Clinician: [name] Referring Aerik Polan: Treating  [name]/Extender: [name] in Treatment: 161 Vital Signs Time Taken: 07:40 Temperature (F): 97.5 Height (in): 77 Pulse (bpm): 97 Weight (lbs): 280 Respiratory Rate (breaths/min): 18 Body Mass Index (BMI): 33.2 Blood Pressure (mmHg): 124/77 Capillary Blood Glucose (mg/dl): 440 Reference Range: 80 - 120 mg / dl Electronic Signature(s) Signed: 11/24/2022 9:41:17 AM By: Haywood Pao CHT EMT BS , , Previous Signature: 11/24/2022 9:34:47 AM Version By: Haywood Pao CHT EMT BS , , Entered By: Haywood Pao on 11/24/2022 09:41:17

## 2022-11-24 NOTE — Progress Notes (Addendum)
[place], [name] ([id number]) [id number] Page 1 of 3 Visit Report for 11/24/2022 HBO Details Patient Name: Date of Service: [name] 11/24/2022 7:30 A M Medical Record Number: [id number] Patient Account Number: [id number] Date of Birth/Sex: Treating RN: [birth year] (37 y.o. [name] Primary Care Leeza Heiner: [name] Other Clinician: Haywood Pao Referring Declin Rajan: Treating Delphine Sizemore/Extender: [name] in Treatment: 161 HBO Treatment Course Details Treatment Course Number: 2 Ordering Chanze Teagle: Duanne Guess T Treatments Ordered: otal 40 HBO Treatment Start Date: 10/04/2022 HBO Treatment End Date: 11/24/2022 HBO Indication: Diabetic Ulcer(s) of the Lower Extremity HBO Discharge Outcome: Treatment Series Incomplete; Death Wound #3 Left, Lateral, Plantar Foot HBO Treatment Details Treatment Number: 20 Patient Type: Outpatient Chamber Type: Monoplace Chamber Serial #: [id number] Treatment Protocol: 2.0 ATA with 90 minutes oxygen, and no air breaks Treatment Details Compression Rate Down: 1.5 psi / minute De-Compression Rate Up: 2.0 psi / minute Air breaks and breathing Decompress Decompress Compress Tx Pressure Begins Reached periods Begins Ends (leave unused spaces blank) Chamber Pressure (ATA 1 2 ------2 1 ) Clock Time (24 hr) 08:00 08:11 - - - - - - 09:41 09:50 Treatment Length: 110 (minutes) Treatment Segments: 4 Vital Signs Capillary Blood Glucose Reference Range: 80 - 120 mg / dl HBO Diabetic Blood Glucose Intervention Range: <131 mg/dl or >578 mg/dl Type: Time Vitals Blood Respiratory Capillary Blood Glucose Pulse Action Pulse: Temperature: Taken: Pressure: Rate: Glucose (mg/dl): Meter #: Oximetry (%) Taken: Pre 07:40 124/77 97 18 97.5 176 none per protocol Post 09:54 168/110 98 18 98.1 172 none per protocol Treatment Response Treatment Toleration: Well Treatment Completion Status:  Treatment Completed without Adverse Event Treatment Notes [name] arrived with vital signs within normal range. He prepared for treatment. After performing a safety check, patient was placed in the chamber which was compressed with 100% oxygen at a rate of 2 psi/min after confirming normal ear equalization. He tolerated the treatment and subsequent decompression of the chamber at the rate of 2 psi/min. His post-treatment diastolic BP was 110 mmHg. He stated that he felt fine. He did state that he feels stressed from being in the chamber when he gets out and that may affect his blood pressure. Other vital signs were within normal range. He was stable upon discharge. Additional Procedure Documentation Tissue Sevierity: Necrosis of bone Physician HBO Attestation: I certify that I supervised this HBO treatment in accordance with Medicare guidelines. A trained emergency response team is readily available per Yes hospital policies and procedures. Continue HBOT as ordered. Yes [place], [name] ([id number]) [id number] Page 2 of 3 Electronic Signature(s) Signed: 11/29/2022 8:38:57 AM By: Haywood Pao CHT EMT BS , , Signed: 11/29/2022 12:43:24 PM By: Duanne Guess MD FACS Previous Signature: 11/24/2022 12:59:43 PM Version By: Duanne Guess MD FACS Previous Signature: 11/24/2022 12:05:10 PM Version By: Haywood Pao CHT EMT BS , , Previous Signature: 11/24/2022 12:03:26 PM Version By: Haywood Pao CHT EMT BS , , Previous Signature: 11/24/2022 9:40:49 AM Version By: Haywood Pao CHT EMT BS , , Entered By: Haywood Pao on 11/29/2022 05:38:57 -------------------------------------------------------------------------------- HBO Safety Checklist Details Patient Name: Date of Service: [name], [name] 11/24/2022 7:30 A M Medical Record Number: [id number] Patient Account Number: [id number] Date of Birth/Sex: Treating RN: 1985-05-16 (37 y.o. [name] Primary Care Rashida Ladouceur: [name] Other Clinician: Haywood Pao Referring Mana Haberl: Treating Karoline Fleer/Extender: [name] in Treatment: 161 HBO Safety Checklist Items Safety Checklist Consent Form Signed Patient voided /  foley secured and emptied When did you last eato 0630 - Fruit Last dose of injectable or oral agent Yesterday 2000 12 Units Novalog Ostomy pouch emptied and vented if applicable NA All implantable devices assessed, documented and approved Freestyle Libre 2 and3 on approved list Intravenous access site secured and place NA Valuables secured Linens and cotton and cotton/polyester blend (less than 51% polyester) Personal oil-based products / skin lotions / body lotions removed Wigs or hairpieces removed NA Smoking or tobacco materials removed NA Books / newspapers / magazines / loose paper removed Cologne, aftershave, perfume and deodorant removed Jewelry removed (may wrap wedding band) Make-up removed NA Hair care products removed Battery operated devices (external) removed Heating patches and chemical warmers removed Titanium eyewear removed Nail polish cured greater than 10 hours NA Casting material cured greater than 10 hours NA Hearing aids removed NA Loose dentures or partials removed NA Prosthetics have been removed NA Patient demonstrates correct use of air break device (if applicable) Patient concerns have been addressed Patient grounding bracelet on and cord attached to chamber Specifics for Inpatients (complete in addition to above) Medication sheet sent with patient NA Intravenous medications needed or due during therapy sent with patient NA Drainage tubes (e.g. nasogastric tube or chest tube secured and vented) NA Endotracheal or Tracheotomy tube secured NA Cuff deflated of air and inflated with saline NA Airway suctioned NA Notes Paper version used prior to treatment start. Electronic  Signature(s) Signed: 11/24/2022 12:20:59 PM By: Haywood Pao CHT EMT BS , , Previous Signature: 11/24/2022 9:37:32 AM Version By: Haywood Pao CHT EMT BS , , [place], [name] ([id number]) [id number] Page 3 of 3 Entered By: Haywood Pao on 11/24/2022 09:20:59

## 2022-11-24 NOTE — Progress Notes (Signed)
[place], [name] ([id number]) [id number] Page 1 of 1 Visit Report for 11/24/2022 SuperBill Details Patient Name: Date of Service: [name] 11/24/2022 Medical Record Number: [id number] Patient Account Number: [id number] Date of Birth/Sex: Treating RN: 1985-06-05 (37 y.o. 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 without evidence of necrosis L97.512 Non-pressure chronic ulcer of other part of right foot with fat layer exposed M86.172 Other acute osteomyelitis, left ankle and foot E11.621 Type 2 diabetes mellitus with foot ulcer E11.622 Type 2 diabetes mellitus with other skin ulcer Facility Procedures CPT4 Code Description Modifier Quantity [phone] G0277-(Facility Use Only) HBOT full body chamber, , 4 ICD-10 Diagnosis Description 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 M86.172 Other acute osteomyelitis, left ankle and foot E11.622 Type 2 diabetes mellitus with other skin ulcer Physician Procedures Quantity CPT4 Code Description Modifier [id number] 99183 - WC PHYS HYPERBARIC OXYGEN THERAPY 1 ICD-10 Diagnosis Description 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

## 2022-11-25 ENCOUNTER — Encounter (HOSPITAL_BASED_OUTPATIENT_CLINIC_OR_DEPARTMENT_OTHER): Payer: BC Managed Care – PPO | Admitting: General Surgery

## 2022-11-25 DIAGNOSIS — E11622 Type 2 diabetes mellitus with other skin ulcer: Secondary | ICD-10-CM | POA: Diagnosis not present

## 2022-11-25 LAB — GLUCOSE, CAPILLARY
Glucose-Capillary: 121 mg/dL — ABNORMAL HIGH (ref 70–99)
Glucose-Capillary: 108 mg/dL — ABNORMAL HIGH (ref 70–99)
Glucose-Capillary: 96 mg/dL (ref 70–99)

## 2022-11-25 NOTE — Progress Notes (Addendum)
[place], [name] ([id number]) [id number] Page 1 of 4 Visit Report for 11/25/2022 Arrival Information Details Patient Name: Date of Service: [name] 11/25/2022 9:30 A M Medical Record Number: [id number] Patient Account Number: [id number] Date of Birth/Sex: Treating RN: 04/11/1985 (37 y.o. Male) Samuella Bruin Primary Care Makia Bossi: [name] Other Clinician: Haywood Pao Referring Trudy Kory: Treating Esgar Barnick/Extender: [name] in Treatment: 161 Visit Information History Since Last Visit All ordered tests and consults were completed: Yes Patient Arrived: Ambulatory Added or deleted any medications: No Arrival Time: 09:51 Any new allergies or adverse reactions: No Accompanied By: self Had a fall or experienced change in No Transfer Assistance: None activities of daily living that may affect Patient Identification Verified: Yes risk of falls: Secondary Verification Process Completed: Yes Signs or symptoms of abuse/neglect since last visito No Patient Requires Transmission-Based Precautions: No Hospitalized since last visit: No Patient Has Alerts: No Implantable device outside of the clinic excluding No cellular tissue based products placed in the center since last visit: Pain Present Now: No Electronic Signature(s) Signed: 11/25/2022 11:33:26 AM By: Haywood Pao CHT EMT BS , , Entered By: Haywood Pao on 11/25/2022 11:33:26 -------------------------------------------------------------------------------- Clinic Level of Care Assessment Details Patient Name: Date of Service: [name] 11/25/2022 9:30 A M Medical Record Number: [id number] Patient Account Number: [id number] Date of Birth/Sex: Treating RN: 1986-02-02 (37 y.o. Male) Samuella Bruin Primary Care Linday Rhodes: [name] Other Clinician: Haywood Pao Referring Latoya Diskin: Treating Lamberto Dinapoli/Extender: [name] in Treatment: 161 Clinic Level of Care Assessment Items TOOL 4 Quantity Score []  - 0 Use when only an EandM is performed on FOLLOW-UP visit ASSESSMENTS - Nursing Assessment / Reassessment []  - 0 Reassessment of Co-morbidities (includes updates in patient status) []  - 0 Reassessment of Adherence to Treatment Plan ASSESSMENTS - Wound and Skin A ssessment / Reassessment []  - 0 Simple Wound Assessment / Reassessment - one wound []  - 0 Complex Wound Assessment / Reassessment - multiple wounds []  - 0 Dermatologic / Skin Assessment (not related to wound area) ASSESSMENTS - Focused Assessment []  - 0 Circumferential Edema Measurements - multi extremities []  - 0 Nutritional Assessment / Counseling / Intervention [place], [name] ([id number]) [id number] Page 2 of 4 []  - 0 Lower Extremity Assessment (monofilament, tuning fork, pulses) []  - 0 Peripheral Arterial Disease Assessment (using hand held doppler) ASSESSMENTS - Ostomy and/or Continence Assessment and Care []  - 0 Incontinence Assessment and Management []  - 0 Ostomy Care Assessment and Management (repouching, etc.) PROCESS - Coordination of Care []  - 0 Simple Patient / Family Education for ongoing care []  - 0 Complex (extensive) Patient / Family Education for ongoing care []  - 0 Staff obtains Chiropractor, Records, T Results / Process Orders est []  - 0 Staff telephones HHA, Nursing Homes / Clarify orders / etc []  - 0 Routine Transfer to another Facility (non-emergent condition) []  - 0 Routine Hospital Admission (non-emergent condition) []  - 0 New Admissions / Manufacturing engineer / Ordering NPWT Apligraf, etc. , []  - 0 Emergency Hospital Admission (emergent condition) []  - 0 Simple Discharge Coordination []  - 0 Complex (extensive) Discharge Coordination PROCESS - Special Needs []  - 0 Pediatric / Minor Patient Management []  - 0 Isolation Patient  Management []  - 0 Hearing / Language / Visual special needs []  - 0 Assessment of Community assistance (transportation, [name]/C planning, etc.) []  - 0 Additional assistance / Altered mentation []  - 0 Support Surface(s) Assessment (bed, cushion, seat, etc.) INTERVENTIONS - Wound  Cleansing / Measurement []  - 0 Simple Wound Cleansing - one wound []  - 0 Complex Wound Cleansing - multiple wounds []  - 0 Wound Imaging (photographs - any number of wounds) []  - 0 Wound Tracing (instead of photographs) []  - 0 Simple Wound Measurement - one wound []  - 0 Complex Wound Measurement - multiple wounds INTERVENTIONS - Wound Dressings []  - 0 Small Wound Dressing one or multiple wounds []  - 0 Medium Wound Dressing one or multiple wounds []  - 0 Large Wound Dressing one or multiple wounds []  - 0 Application of Medications - topical []  - 0 Application of Medications - injection INTERVENTIONS - Miscellaneous []  - 0 External ear exam X- 1 5 Specimen Collection (cultures, biopsies, blood, body fluids, etc.) []  - 0 Specimen(s) / Culture(s) sent or taken to Lab for analysis []  - 0 Patient Transfer (multiple staff / Nurse, adult / Similar devices) []  - 0 Simple Staple / Suture removal (25 or less) []  - 0 Complex Staple / Suture removal (26 or more) []  - 0 Hypo / Hyperglycemic Management (close monitor of Blood Glucose) [place], [name] ([id number]) [id number] Page 3 of 4 []  - 0 Ankle / Brachial Index (ABI) - do not check if billed separately X- 1 5 Vital Signs Has the patient been seen at the hospital within the last three years: Yes Total Score: 10 Level Of Care: New/Established - Level 1 Electronic Signature(s) Signed: 12/12/2022 5:52:12 PM By: Haywood Pao CHT EMT BS , , Entered By: Haywood Pao on 11/25/2022 11:46:52 -------------------------------------------------------------------------------- Encounter Discharge Information Details Patient Name:  Date of Service: [name], [name] 11/25/2022 9:30 A M Medical Record Number: [id number] Patient Account Number: [id number] Date of Birth/Sex: Treating RN: [birth year] (37 y.o. Male) Samuella Bruin Primary Care Lareen Mullings: [name] Other Clinician: Haywood Pao Referring Cydnie Deason: Treating Jessy Cybulski/Extender: [name] in Treatment: 161 Encounter Discharge Information Items Discharge Condition: Stable Ambulatory Status: Walker Discharge Destination: Home Transportation: Private Auto Accompanied By: self Schedule Follow-up Appointment: No Clinical Summary of Care: Electronic Signature(s) Signed: 11/25/2022 11:44:05 AM By: Haywood Pao CHT EMT BS , , Entered By: Haywood Pao on 11/25/2022 11:44:05 -------------------------------------------------------------------------------- General Visit Notes Details Patient Name: Date of Service: [name], [name] 11/25/2022 9:30 A M Medical Record Number: [id number] Patient Account Number: [id number] Date of Birth/Sex: Treating RN: 1985-11-04 (37 y.o. Male) Samuella Bruin Primary Care Erykah Lippert: [name] Other Clinician: Haywood Pao Referring Yiselle Babich: Treating Lysha Schrade/Extender: [name] in Treatment: 161 Notes [phone] CBG was 121 mg/dL. Given 8 oz Glucerna and 2 x 4 oz Orange Juice. CBG remeasured at 108 mg/dL. Patient's Libre device showed a downward trend, so an additional CBG reading was taken with result of 96 mg/dL. Patient declined other items to eat but did take a 4 oz apple juice stating that he would be okay and would eat. Treatment was rescheduled for another day. Electronic Signature(s) Signed: 11/25/2022 11:43:34 AM By: Haywood Pao CHT EMT BS , , Entered By: Haywood Pao on 11/25/2022 11:43:33 [place], [name] ([id number]) [id number] Page 4 of  4 -------------------------------------------------------------------------------- Vitals Details Patient Name: Date of Service: [name] 11/25/2022 9:30 A M Medical Record Number: [id number] Patient Account Number: [id number] Date of Birth/Sex: Treating RN: 03-09-1986 (37 y.o. Male) Samuella Bruin Primary Care Tamlyn Sides: [name] Other Clinician: Haywood Pao Referring Kinberly Perris: Treating Christophe Rising/Extender: [name] in Treatment: 161 Vital Signs Time Taken: 09:56 Temperature (F): 98.2 Height (in): 77 Pulse (bpm): 112 Weight (  lbs): 280 Respiratory Rate (breaths/min): 18 Body Mass Index (BMI): 33.2 Blood Pressure (mmHg): 125/76 Capillary Blood Glucose (mg/dl): 161 Reference Range: 80 - 120 mg / dl Notes 0960 CBG was 454 mg/dL. Given 8 oz Glucerna and 2 x 4 oz Orange Juice. CBG remeasured at 108 mg/dL. Patient's Libre device showed a downward trend, so an additional CBG reading was taken with result of 96 mg/dL. Patient declined other items to eat but did take a 4 oz apple juice stating that he would be okay and would eat. Electronic Signature(s) Signed: 11/25/2022 11:42:57 AM By: Haywood Pao CHT EMT BS , , Entered By: Haywood Pao on 11/25/2022 11:42:57

## 2022-11-25 NOTE — Progress Notes (Signed)
[place], [name] ([id number]) [id number] Page 1 of 1 Visit Report for 11/25/2022 SuperBill Details Patient Name: Date of Service: [name] 11/25/2022 Medical Record Number: [id number] Patient Account Number: [id number] Date of Birth/Sex: Treating RN: 06/21/1985 (37 y.o. 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 L97.512 Non-pressure chronic ulcer of other part of right foot with fat layer exposed M86.172 Other acute osteomyelitis, left ankle and foot E11.621 Type 2 diabetes mellitus with foot ulcer E11.622 Type 2 diabetes mellitus with other skin ulcer Facility Procedures CPT4 Code Description Modifier Quantity [id number] [phone] - WOUND CARE VISIT-LEV 1 EST PT 1 Electronic Signature(s) Signed: 11/25/2022 11:47:10 AM By: Haywood Pao CHT EMT BS , , Signed: 11/25/2022 12:35:56 PM By: Duanne Guess MD FACS Previous Signature: 11/25/2022 11:44:23 AM Version By: Haywood Pao CHT EMT BS , , Entered By: Haywood Pao on 11/25/2022 11:47:08

## 2022-11-28 ENCOUNTER — Encounter (HOSPITAL_BASED_OUTPATIENT_CLINIC_OR_DEPARTMENT_OTHER): Payer: BC Managed Care – PPO | Admitting: General Surgery

## 2022-11-29 ENCOUNTER — Encounter (HOSPITAL_BASED_OUTPATIENT_CLINIC_OR_DEPARTMENT_OTHER): Payer: BC Managed Care – PPO | Admitting: General Surgery

## 2022-11-30 ENCOUNTER — Ambulatory Visit: Payer: BC Managed Care – PPO | Admitting: Internal Medicine

## 2022-11-30 ENCOUNTER — Encounter (HOSPITAL_BASED_OUTPATIENT_CLINIC_OR_DEPARTMENT_OTHER): Payer: BC Managed Care – PPO | Admitting: General Surgery

## 2022-12-01 ENCOUNTER — Encounter (HOSPITAL_BASED_OUTPATIENT_CLINIC_OR_DEPARTMENT_OTHER): Payer: BC Managed Care – PPO | Admitting: General Surgery

## 2022-12-01 ENCOUNTER — Encounter: Payer: BC Managed Care – PPO | Admitting: Podiatry

## 2022-12-02 ENCOUNTER — Encounter (HOSPITAL_BASED_OUTPATIENT_CLINIC_OR_DEPARTMENT_OTHER): Payer: BC Managed Care – PPO | Admitting: General Surgery

## 2022-12-04 DEATH — deceased

## 2022-12-08 ENCOUNTER — Ambulatory Visit: Payer: BC Managed Care – PPO | Admitting: Podiatry
